# Patient Record
Sex: Male | Born: 1950
Health system: Southern US, Community
[De-identification: ages and names within clinical notes are randomized; demographics above are authoritative.]

## PROBLEM LIST (undated history)

## (undated) DIAGNOSIS — Z8739 Personal history of other diseases of the musculoskeletal system and connective tissue: Secondary | ICD-10-CM

## (undated) DIAGNOSIS — J189 Pneumonia, unspecified organism: Secondary | ICD-10-CM

## (undated) DIAGNOSIS — N186 End stage renal disease: Secondary | ICD-10-CM

## (undated) DIAGNOSIS — S065X9A Traumatic subdural hemorrhage with loss of consciousness of unspecified duration, initial encounter: Secondary | ICD-10-CM

## (undated) DIAGNOSIS — D649 Anemia, unspecified: Secondary | ICD-10-CM

## (undated) DIAGNOSIS — E785 Hyperlipidemia, unspecified: Secondary | ICD-10-CM

## (undated) DIAGNOSIS — H409 Unspecified glaucoma: Secondary | ICD-10-CM

## (undated) DIAGNOSIS — Z860100 Personal history of colon polyps, unspecified: Secondary | ICD-10-CM

## (undated) DIAGNOSIS — M545 Low back pain, unspecified: Secondary | ICD-10-CM

## (undated) DIAGNOSIS — IMO0001 Reserved for inherently not codable concepts without codable children: Secondary | ICD-10-CM

## (undated) DIAGNOSIS — G8929 Other chronic pain: Secondary | ICD-10-CM

## (undated) DIAGNOSIS — N185 Chronic kidney disease, stage 5: Secondary | ICD-10-CM

## (undated) DIAGNOSIS — A539 Syphilis, unspecified: Secondary | ICD-10-CM

## (undated) DIAGNOSIS — I10 Essential (primary) hypertension: Secondary | ICD-10-CM

## (undated) DIAGNOSIS — K219 Gastro-esophageal reflux disease without esophagitis: Secondary | ICD-10-CM

## (undated) DIAGNOSIS — R351 Nocturia: Secondary | ICD-10-CM

## (undated) DIAGNOSIS — Z973 Presence of spectacles and contact lenses: Secondary | ICD-10-CM

## (undated) DIAGNOSIS — I255 Ischemic cardiomyopathy: Secondary | ICD-10-CM

## (undated) DIAGNOSIS — F1910 Other psychoactive substance abuse, uncomplicated: Secondary | ICD-10-CM

## (undated) DIAGNOSIS — I5020 Unspecified systolic (congestive) heart failure: Secondary | ICD-10-CM

## (undated) DIAGNOSIS — E119 Type 2 diabetes mellitus without complications: Secondary | ICD-10-CM

## (undated) DIAGNOSIS — B20 Human immunodeficiency virus [HIV] disease: Secondary | ICD-10-CM

## (undated) DIAGNOSIS — G629 Polyneuropathy, unspecified: Secondary | ICD-10-CM

## (undated) DIAGNOSIS — M1990 Unspecified osteoarthritis, unspecified site: Secondary | ICD-10-CM

## (undated) DIAGNOSIS — I2510 Atherosclerotic heart disease of native coronary artery without angina pectoris: Secondary | ICD-10-CM

## (undated) DIAGNOSIS — I5042 Chronic combined systolic (congestive) and diastolic (congestive) heart failure: Secondary | ICD-10-CM

## (undated) DIAGNOSIS — Z972 Presence of dental prosthetic device (complete) (partial): Secondary | ICD-10-CM

## (undated) DIAGNOSIS — I509 Heart failure, unspecified: Secondary | ICD-10-CM

## (undated) DIAGNOSIS — J449 Chronic obstructive pulmonary disease, unspecified: Secondary | ICD-10-CM

## (undated) DIAGNOSIS — M62838 Other muscle spasm: Secondary | ICD-10-CM

## (undated) DIAGNOSIS — K922 Gastrointestinal hemorrhage, unspecified: Secondary | ICD-10-CM

## (undated) DIAGNOSIS — Z9289 Personal history of other medical treatment: Secondary | ICD-10-CM

## (undated) DIAGNOSIS — J9600 Acute respiratory failure, unspecified whether with hypoxia or hypercapnia: Secondary | ICD-10-CM

## (undated) DIAGNOSIS — S065XAA Traumatic subdural hemorrhage with loss of consciousness status unknown, initial encounter: Secondary | ICD-10-CM

## (undated) DIAGNOSIS — I219 Acute myocardial infarction, unspecified: Secondary | ICD-10-CM

## (undated) DIAGNOSIS — B1910 Unspecified viral hepatitis B without hepatic coma: Secondary | ICD-10-CM

## (undated) DIAGNOSIS — Z87442 Personal history of urinary calculi: Secondary | ICD-10-CM

## (undated) HISTORY — DX: Gastro-esophageal reflux disease without esophagitis: K21.9

## (undated) HISTORY — DX: Acute respiratory failure, unspecified whether with hypoxia or hypercapnia: J96.00

## (undated) HISTORY — DX: Atherosclerotic heart disease of native coronary artery without angina pectoris: I25.10

## (undated) HISTORY — PX: CARDIAC CATHETERIZATION: SHX172

## (undated) HISTORY — DX: Pneumonia, unspecified organism: J18.9

## (undated) HISTORY — DX: Ischemic cardiomyopathy: I25.5

## (undated) HISTORY — DX: Traumatic subdural hemorrhage with loss of consciousness of unspecified duration, initial encounter: S06.5X9A

## (undated) HISTORY — PX: BACK SURGERY: SHX140

## (undated) HISTORY — DX: Traumatic subdural hemorrhage with loss of consciousness status unknown, initial encounter: S06.5XAA

## (undated) HISTORY — PX: FRACTURE SURGERY: SHX138

## (undated) HISTORY — DX: Other psychoactive substance abuse, uncomplicated: F19.10

## (undated) HISTORY — DX: Human immunodeficiency virus (HIV) disease: B20

## (undated) HISTORY — DX: Essential (primary) hypertension: I10

## (undated) HISTORY — DX: Chronic combined systolic (congestive) and diastolic (congestive) heart failure: I50.42

## (undated) HISTORY — PX: MULTIPLE TOOTH EXTRACTIONS: SHX2053

## (undated) HISTORY — DX: Syphilis, unspecified: A53.9

## (undated) HISTORY — DX: Unspecified osteoarthritis, unspecified site: M19.90

## (undated) HISTORY — DX: Anemia, unspecified: D64.9

---
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
## 1998-02-10 ENCOUNTER — Other Ambulatory Visit: Admission: RE | Admit: 1998-02-10 | Discharge: 1998-02-10 | Payer: Self-pay | Admitting: Family Medicine

## 1999-08-22 ENCOUNTER — Emergency Department (HOSPITAL_COMMUNITY): Admission: EM | Admit: 1999-08-22 | Discharge: 1999-08-22 | Payer: Self-pay | Admitting: *Deleted

## 2000-01-04 ENCOUNTER — Encounter: Payer: Self-pay | Admitting: Family Medicine

## 2000-01-04 ENCOUNTER — Ambulatory Visit (HOSPITAL_COMMUNITY): Admission: RE | Admit: 2000-01-04 | Discharge: 2000-01-04 | Payer: Self-pay | Admitting: Family Medicine

## 2001-02-12 ENCOUNTER — Ambulatory Visit (HOSPITAL_COMMUNITY): Admission: RE | Admit: 2001-02-12 | Discharge: 2001-02-12 | Payer: Self-pay | Admitting: Family Medicine

## 2001-02-12 ENCOUNTER — Encounter: Payer: Self-pay | Admitting: Family Medicine

## 2002-10-24 DIAGNOSIS — E119 Type 2 diabetes mellitus without complications: Secondary | ICD-10-CM

## 2002-10-24 HISTORY — DX: Type 2 diabetes mellitus without complications: E11.9

## 2002-11-19 ENCOUNTER — Encounter: Payer: Self-pay | Admitting: Cardiology

## 2002-11-19 ENCOUNTER — Ambulatory Visit (HOSPITAL_COMMUNITY): Admission: RE | Admit: 2002-11-19 | Discharge: 2002-11-19 | Payer: Self-pay | Admitting: Cardiology

## 2003-02-21 ENCOUNTER — Encounter: Payer: Self-pay | Admitting: Cardiothoracic Surgery

## 2003-02-24 ENCOUNTER — Encounter: Payer: Self-pay | Admitting: Cardiothoracic Surgery

## 2003-02-24 ENCOUNTER — Inpatient Hospital Stay (HOSPITAL_COMMUNITY): Admission: RE | Admit: 2003-02-24 | Discharge: 2003-02-27 | Payer: Self-pay | Admitting: Cardiothoracic Surgery

## 2003-02-24 HISTORY — PX: CORONARY ARTERY BYPASS GRAFT: SHX141

## 2003-02-25 ENCOUNTER — Encounter: Payer: Self-pay | Admitting: Cardiothoracic Surgery

## 2003-02-26 ENCOUNTER — Encounter: Payer: Self-pay | Admitting: Cardiothoracic Surgery

## 2003-10-25 DIAGNOSIS — I2510 Atherosclerotic heart disease of native coronary artery without angina pectoris: Secondary | ICD-10-CM

## 2003-10-25 HISTORY — DX: Atherosclerotic heart disease of native coronary artery without angina pectoris: I25.10

## 2004-03-18 ENCOUNTER — Emergency Department (HOSPITAL_COMMUNITY): Admission: EM | Admit: 2004-03-18 | Discharge: 2004-03-18 | Payer: Self-pay | Admitting: Emergency Medicine

## 2004-06-23 ENCOUNTER — Emergency Department (HOSPITAL_COMMUNITY): Admission: EM | Admit: 2004-06-23 | Discharge: 2004-06-23 | Payer: Self-pay

## 2004-07-12 ENCOUNTER — Ambulatory Visit: Payer: Self-pay | Admitting: Internal Medicine

## 2004-08-09 ENCOUNTER — Ambulatory Visit: Payer: Self-pay | Admitting: *Deleted

## 2004-08-13 ENCOUNTER — Ambulatory Visit: Payer: Self-pay | Admitting: Infectious Diseases

## 2004-08-13 ENCOUNTER — Inpatient Hospital Stay (HOSPITAL_COMMUNITY): Admission: EM | Admit: 2004-08-13 | Discharge: 2004-08-18 | Payer: Self-pay | Admitting: Emergency Medicine

## 2004-08-13 HISTORY — PX: ORIF MANDIBULAR FRACTURE: SHX2127

## 2004-08-30 ENCOUNTER — Ambulatory Visit: Payer: Self-pay | Admitting: Cardiology

## 2004-08-31 ENCOUNTER — Ambulatory Visit: Payer: Self-pay | Admitting: Internal Medicine

## 2004-09-02 ENCOUNTER — Ambulatory Visit: Payer: Self-pay | Admitting: Cardiology

## 2004-09-20 ENCOUNTER — Ambulatory Visit: Payer: Self-pay | Admitting: Internal Medicine

## 2004-10-13 ENCOUNTER — Ambulatory Visit: Payer: Self-pay | Admitting: Internal Medicine

## 2004-11-22 ENCOUNTER — Encounter (INDEPENDENT_AMBULATORY_CARE_PROVIDER_SITE_OTHER): Payer: Self-pay | Admitting: *Deleted

## 2004-11-22 ENCOUNTER — Ambulatory Visit: Payer: Self-pay | Admitting: Internal Medicine

## 2004-11-22 LAB — CONVERTED CEMR LAB: CD4 T Cell Abs: 950

## 2004-12-16 ENCOUNTER — Ambulatory Visit: Payer: Self-pay

## 2004-12-27 ENCOUNTER — Ambulatory Visit: Payer: Self-pay | Admitting: Cardiology

## 2004-12-30 ENCOUNTER — Ambulatory Visit: Payer: Self-pay | Admitting: *Deleted

## 2005-01-03 ENCOUNTER — Inpatient Hospital Stay (HOSPITAL_BASED_OUTPATIENT_CLINIC_OR_DEPARTMENT_OTHER): Admission: RE | Admit: 2005-01-03 | Discharge: 2005-01-03 | Payer: Self-pay | Admitting: Cardiology

## 2005-01-03 ENCOUNTER — Ambulatory Visit: Payer: Self-pay | Admitting: Cardiology

## 2005-01-12 ENCOUNTER — Ambulatory Visit: Payer: Self-pay | Admitting: Cardiology

## 2005-01-19 ENCOUNTER — Ambulatory Visit: Payer: Self-pay | Admitting: Internal Medicine

## 2005-01-20 ENCOUNTER — Ambulatory Visit: Payer: Self-pay

## 2005-02-08 ENCOUNTER — Ambulatory Visit: Payer: Self-pay | Admitting: Cardiology

## 2005-02-17 ENCOUNTER — Ambulatory Visit: Payer: Self-pay | Admitting: Internal Medicine

## 2005-05-26 ENCOUNTER — Ambulatory Visit: Payer: Self-pay | Admitting: Internal Medicine

## 2005-06-28 ENCOUNTER — Emergency Department (HOSPITAL_COMMUNITY): Admission: EM | Admit: 2005-06-28 | Discharge: 2005-06-28 | Payer: Self-pay | Admitting: Emergency Medicine

## 2005-07-08 ENCOUNTER — Ambulatory Visit: Payer: Self-pay | Admitting: Family Medicine

## 2005-07-27 ENCOUNTER — Ambulatory Visit: Payer: Self-pay | Admitting: Internal Medicine

## 2005-08-16 ENCOUNTER — Ambulatory Visit: Payer: Self-pay | Admitting: Internal Medicine

## 2005-10-03 ENCOUNTER — Ambulatory Visit: Payer: Self-pay | Admitting: Internal Medicine

## 2005-10-26 ENCOUNTER — Emergency Department (HOSPITAL_COMMUNITY): Admission: EM | Admit: 2005-10-26 | Discharge: 2005-10-26 | Payer: Self-pay | Admitting: Emergency Medicine

## 2005-11-01 ENCOUNTER — Ambulatory Visit: Payer: Self-pay | Admitting: Internal Medicine

## 2005-11-01 ENCOUNTER — Ambulatory Visit (HOSPITAL_COMMUNITY): Admission: RE | Admit: 2005-11-01 | Discharge: 2005-11-01 | Payer: Self-pay | Admitting: Internal Medicine

## 2005-11-02 ENCOUNTER — Encounter (INDEPENDENT_AMBULATORY_CARE_PROVIDER_SITE_OTHER): Payer: Self-pay | Admitting: *Deleted

## 2005-11-02 LAB — CONVERTED CEMR LAB
CD4 Count: 990 microliters
HIV 1 RNA Quant: 399 copies/mL

## 2005-12-21 ENCOUNTER — Ambulatory Visit: Payer: Self-pay | Admitting: Infectious Diseases

## 2006-01-02 ENCOUNTER — Ambulatory Visit: Payer: Self-pay | Admitting: Cardiology

## 2006-02-21 ENCOUNTER — Ambulatory Visit: Payer: Self-pay | Admitting: Infectious Diseases

## 2006-02-21 ENCOUNTER — Encounter: Admission: RE | Admit: 2006-02-21 | Discharge: 2006-02-21 | Payer: Self-pay | Admitting: Infectious Diseases

## 2006-02-21 ENCOUNTER — Encounter (INDEPENDENT_AMBULATORY_CARE_PROVIDER_SITE_OTHER): Payer: Self-pay | Admitting: *Deleted

## 2006-02-21 LAB — CONVERTED CEMR LAB: CD4 Count: 910 microliters

## 2006-03-22 ENCOUNTER — Ambulatory Visit: Payer: Self-pay | Admitting: Internal Medicine

## 2006-03-22 ENCOUNTER — Encounter (INDEPENDENT_AMBULATORY_CARE_PROVIDER_SITE_OTHER): Payer: Self-pay | Admitting: Infectious Diseases

## 2006-03-27 ENCOUNTER — Ambulatory Visit: Payer: Self-pay | Admitting: Infectious Diseases

## 2006-05-08 ENCOUNTER — Emergency Department (HOSPITAL_COMMUNITY): Admission: EM | Admit: 2006-05-08 | Discharge: 2006-05-08 | Payer: Self-pay | Admitting: Emergency Medicine

## 2006-05-24 HISTORY — PX: LAPAROSCOPIC CHOLECYSTECTOMY: SUR755

## 2006-06-14 ENCOUNTER — Encounter (INDEPENDENT_AMBULATORY_CARE_PROVIDER_SITE_OTHER): Payer: Self-pay | Admitting: *Deleted

## 2006-06-14 ENCOUNTER — Inpatient Hospital Stay (HOSPITAL_COMMUNITY): Admission: EM | Admit: 2006-06-14 | Discharge: 2006-06-17 | Payer: Self-pay | Admitting: Emergency Medicine

## 2006-06-20 ENCOUNTER — Ambulatory Visit: Payer: Self-pay | Admitting: Gastroenterology

## 2006-08-14 ENCOUNTER — Encounter (INDEPENDENT_AMBULATORY_CARE_PROVIDER_SITE_OTHER): Payer: Self-pay | Admitting: Infectious Diseases

## 2006-08-14 ENCOUNTER — Encounter (INDEPENDENT_AMBULATORY_CARE_PROVIDER_SITE_OTHER): Payer: Self-pay | Admitting: *Deleted

## 2006-08-14 ENCOUNTER — Ambulatory Visit: Payer: Self-pay | Admitting: Internal Medicine

## 2006-08-14 ENCOUNTER — Encounter: Admission: RE | Admit: 2006-08-14 | Discharge: 2006-08-14 | Payer: Self-pay | Admitting: Infectious Diseases

## 2006-08-14 LAB — CONVERTED CEMR LAB
ALT: 30 units/L (ref 0–40)
AST: 21 units/L (ref 0–37)
Albumin: 3.8 g/dL (ref 3.5–5.2)
Alkaline Phosphatase: 86 units/L (ref 39–117)
BUN: 17 mg/dL (ref 6–23)
Basophils Absolute: 0 10*3/uL (ref 0.0–0.1)
Basophils percent auto: 1 % (ref 0–1)
CO2: 28 meq/L (ref 19–32)
Calcium: 9.4 mg/dL (ref 8.4–10.5)
Chloride: 107 meq/L (ref 96–112)
Creatinine, Ser: 1.17 mg/dL (ref 0.40–1.50)
Eosinophils Absolute: 0.2 cells/mcL (ref 0.0–0.7)
Eosinophils Relative: 2 % (ref 0–5)
Glucose, Bld: 89 mg/dL (ref 70–99)
HCT: 44.6 % (ref 39.0–52.0)
HIV 1 RNA Quant: 49 copies/mL
HIV 1 RNA Quant: <50 copies/mL (ref <50)
HIV-1 RNA Quant, Log: <1.7 (ref <1.70)
Hemoglobin: 15.2 g/dL (ref 13.0–17.0)
Leukocyte count, blood: 6.3 10*9/L (ref 4.0–10.5)
Lymphocytes Relative: 57 % — ABNORMAL HIGH (ref 12–46)
Lymphs Abs: 3.6 10*3/uL — ABNORMAL HIGH (ref 0.7–3.3)
MCHC: 34.1 g/dL (ref 30.0–36.0)
MCV: 108 fL — ABNORMAL HIGH (ref 78.0–100.0)
Monocytes Absolute: 0.6 10*3/uL (ref 0.2–0.7)
Monocytes Relative: 9 % (ref 3–11)
Neutro Abs: 1.9 10*3/uL (ref 1.7–7.7)
Neutrophils Relative %: 31 % — ABNORMAL LOW (ref 43–77)
Platelets: 205 10*3/uL (ref 150–400)
Potassium: 4.4 meq/L (ref 3.5–5.3)
RBC: 4.13 M/uL — ABNORMAL LOW (ref 4.22–5.81)
RDW: 13.4 % (ref 11.5–14.0)
Sodium: 142 meq/L (ref 135–145)
Total Bilirubin: 1.4 mg/dL — ABNORMAL HIGH (ref 0.3–1.2)
Total Protein: 7.2 g/dL (ref 6.0–8.3)

## 2006-08-28 ENCOUNTER — Ambulatory Visit: Payer: Self-pay | Admitting: Infectious Diseases

## 2006-09-02 DIAGNOSIS — A539 Syphilis, unspecified: Secondary | ICD-10-CM

## 2006-09-02 DIAGNOSIS — B20 Human immunodeficiency virus [HIV] disease: Secondary | ICD-10-CM | POA: Insufficient documentation

## 2006-11-20 ENCOUNTER — Ambulatory Visit: Payer: Self-pay | Admitting: Internal Medicine

## 2006-11-20 DIAGNOSIS — I10 Essential (primary) hypertension: Secondary | ICD-10-CM | POA: Insufficient documentation

## 2006-11-20 DIAGNOSIS — F17200 Nicotine dependence, unspecified, uncomplicated: Secondary | ICD-10-CM

## 2006-11-20 DIAGNOSIS — J309 Allergic rhinitis, unspecified: Secondary | ICD-10-CM | POA: Insufficient documentation

## 2006-11-20 DIAGNOSIS — M109 Gout, unspecified: Secondary | ICD-10-CM | POA: Insufficient documentation

## 2006-11-20 DIAGNOSIS — E782 Mixed hyperlipidemia: Secondary | ICD-10-CM | POA: Insufficient documentation

## 2006-11-20 DIAGNOSIS — E785 Hyperlipidemia, unspecified: Secondary | ICD-10-CM

## 2006-11-20 LAB — CONVERTED CEMR LAB
Glucose, Bld: 102 mg/dL
Hgb A1c MFr Bld: 5 %

## 2006-11-21 ENCOUNTER — Encounter (INDEPENDENT_AMBULATORY_CARE_PROVIDER_SITE_OTHER): Payer: Self-pay | Admitting: Infectious Diseases

## 2006-11-21 ENCOUNTER — Ambulatory Visit: Payer: Self-pay | Admitting: Internal Medicine

## 2006-11-21 LAB — CONVERTED CEMR LAB
ALT: 24 units/L (ref 0–53)
AST: 24 units/L (ref 0–37)
Albumin: 4 g/dL (ref 3.5–5.2)
Alkaline Phosphatase: 75 units/L (ref 39–117)
BUN: 22 mg/dL (ref 6–23)
CO2: 25 meq/L (ref 19–32)
Calcium: 8.9 mg/dL (ref 8.4–10.5)
Chloride: 107 meq/L (ref 96–112)
Cholesterol: 153 mg/dL (ref 0–200)
Creatinine, Ser: 0.95 mg/dL (ref 0.40–1.50)
Creatinine, Urine: 159.2 mg/dL
Glucose, Bld: 70 mg/dL (ref 70–99)
HDL: 39 mg/dL — ABNORMAL LOW (ref >39)
LDL Cholesterol: 67 mg/dL (ref 0–99)
Microalb Creat Ratio: 489.3 mg/g — ABNORMAL HIGH (ref 0.0–30.0)
Microalb, Ur: 77.9 mg/dL — ABNORMAL HIGH (ref 0.00–1.89)
Potassium: 4.3 meq/L (ref 3.5–5.3)
Sodium: 141 meq/L (ref 135–145)
Total Bilirubin: 1.3 mg/dL — ABNORMAL HIGH (ref 0.3–1.2)
Total CHOL/HDL Ratio: 3.9
Total Protein: 7.7 g/dL (ref 6.0–8.3)
Triglycerides: 236 mg/dL — ABNORMAL HIGH (ref <150)
VLDL: 47 mg/dL — ABNORMAL HIGH (ref 0–40)

## 2006-11-24 HISTORY — PX: INTERTROCHANTERIC HIP FRACTURE SURGERY: SHX681

## 2006-12-03 ENCOUNTER — Inpatient Hospital Stay (HOSPITAL_COMMUNITY): Admission: EM | Admit: 2006-12-03 | Discharge: 2006-12-07 | Payer: Self-pay | Admitting: Emergency Medicine

## 2006-12-03 ENCOUNTER — Ambulatory Visit: Payer: Self-pay | Admitting: Internal Medicine

## 2006-12-07 ENCOUNTER — Encounter (INDEPENDENT_AMBULATORY_CARE_PROVIDER_SITE_OTHER): Payer: Self-pay | Admitting: Internal Medicine

## 2006-12-12 ENCOUNTER — Encounter (INDEPENDENT_AMBULATORY_CARE_PROVIDER_SITE_OTHER): Payer: Self-pay | Admitting: Internal Medicine

## 2006-12-18 ENCOUNTER — Telehealth: Payer: Self-pay | Admitting: *Deleted

## 2006-12-18 ENCOUNTER — Encounter (INDEPENDENT_AMBULATORY_CARE_PROVIDER_SITE_OTHER): Payer: Self-pay | Admitting: *Deleted

## 2006-12-18 LAB — CONVERTED CEMR LAB: RPR Titer: 1:4 {titer}

## 2006-12-20 ENCOUNTER — Telehealth: Payer: Self-pay | Admitting: *Deleted

## 2006-12-31 ENCOUNTER — Encounter (INDEPENDENT_AMBULATORY_CARE_PROVIDER_SITE_OTHER): Payer: Self-pay | Admitting: *Deleted

## 2007-01-04 ENCOUNTER — Encounter: Admission: RE | Admit: 2007-01-04 | Discharge: 2007-01-04 | Payer: Self-pay | Admitting: Infectious Diseases

## 2007-01-04 ENCOUNTER — Ambulatory Visit: Payer: Self-pay | Admitting: *Deleted

## 2007-01-04 ENCOUNTER — Encounter (INDEPENDENT_AMBULATORY_CARE_PROVIDER_SITE_OTHER): Payer: Self-pay | Admitting: Infectious Diseases

## 2007-01-04 DIAGNOSIS — S72143A Displaced intertrochanteric fracture of unspecified femur, initial encounter for closed fracture: Secondary | ICD-10-CM

## 2007-01-04 LAB — CONVERTED CEMR LAB
ALT: 20 units/L (ref 0–53)
Alkaline Phosphatase: 181 units/L — ABNORMAL HIGH (ref 39–117)
Blood Glucose, Fingerstick: 76
HIV-1 RNA Quant, Log: <1.7 (ref <1.70)
Hemoglobin, Urine: NEGATIVE
LDL Cholesterol: 43 mg/dL (ref 0–99)
Leukocytes, UA: NEGATIVE
Protein, ur: 100 mg/dL — AB
RPR Titer: 1:2 {titer}
Sodium: 140 meq/L (ref 135–145)
Total Bilirubin: 1.4 mg/dL — ABNORMAL HIGH (ref 0.3–1.2)
Total Protein: 7.7 g/dL (ref 6.0–8.3)
Triglycerides: 220 mg/dL — ABNORMAL HIGH (ref <150)
Urine Glucose: NEGATIVE mg/dL
VLDL: 44 mg/dL — ABNORMAL HIGH (ref 0–40)

## 2007-01-08 ENCOUNTER — Ambulatory Visit: Payer: Self-pay | Admitting: Cardiology

## 2007-01-09 ENCOUNTER — Encounter (INDEPENDENT_AMBULATORY_CARE_PROVIDER_SITE_OTHER): Payer: Self-pay | Admitting: Infectious Diseases

## 2007-01-09 LAB — CONVERTED CEMR LAB
INR: 0.9 (ref 0.0–1.5)
Platelets: 207 10*3/uL (ref 150–400)
RDW: 14.3 % — ABNORMAL HIGH (ref 11.5–14.0)

## 2007-01-10 ENCOUNTER — Telehealth (INDEPENDENT_AMBULATORY_CARE_PROVIDER_SITE_OTHER): Payer: Self-pay | Admitting: Infectious Diseases

## 2007-01-11 ENCOUNTER — Encounter (INDEPENDENT_AMBULATORY_CARE_PROVIDER_SITE_OTHER): Payer: Self-pay | Admitting: Infectious Diseases

## 2007-01-11 ENCOUNTER — Ambulatory Visit: Payer: Self-pay | Admitting: Internal Medicine

## 2007-01-11 LAB — CONVERTED CEMR LAB
RBC Folate: 570 ng/mL (ref 180–600)
Vitamin B-12: 388 pg/mL (ref 211–911)

## 2007-01-23 ENCOUNTER — Ambulatory Visit: Payer: Self-pay

## 2007-01-23 ENCOUNTER — Encounter (INDEPENDENT_AMBULATORY_CARE_PROVIDER_SITE_OTHER): Payer: Self-pay | Admitting: Infectious Diseases

## 2007-01-23 ENCOUNTER — Encounter: Payer: Self-pay | Admitting: Internal Medicine

## 2007-01-25 ENCOUNTER — Telehealth (INDEPENDENT_AMBULATORY_CARE_PROVIDER_SITE_OTHER): Payer: Self-pay | Admitting: Infectious Diseases

## 2007-02-07 ENCOUNTER — Telehealth: Payer: Self-pay | Admitting: Internal Medicine

## 2007-02-21 ENCOUNTER — Ambulatory Visit: Payer: Self-pay | Admitting: Cardiology

## 2007-02-21 ENCOUNTER — Encounter (INDEPENDENT_AMBULATORY_CARE_PROVIDER_SITE_OTHER): Payer: Self-pay | Admitting: Infectious Diseases

## 2007-03-12 ENCOUNTER — Ambulatory Visit: Payer: Self-pay | Admitting: Infectious Diseases

## 2007-03-17 ENCOUNTER — Encounter (INDEPENDENT_AMBULATORY_CARE_PROVIDER_SITE_OTHER): Payer: Self-pay | Admitting: Infectious Diseases

## 2007-04-25 ENCOUNTER — Telehealth: Payer: Self-pay | Admitting: *Deleted

## 2007-05-15 ENCOUNTER — Encounter: Admission: RE | Admit: 2007-05-15 | Discharge: 2007-05-15 | Payer: Self-pay | Admitting: Internal Medicine

## 2007-05-15 ENCOUNTER — Ambulatory Visit: Payer: Self-pay | Admitting: Internal Medicine

## 2007-05-15 LAB — CONVERTED CEMR LAB
ALT: 28 units/L (ref 0–53)
Albumin: 3.8 g/dL (ref 3.5–5.2)
Basophils Absolute: 0.1 10*3/uL (ref 0.0–0.1)
CO2: 22 meq/L (ref 19–32)
Eosinophils Relative: 3 % (ref 0–5)
Glucose, Bld: 132 mg/dL — ABNORMAL HIGH (ref 70–99)
Lymphocytes Relative: 55 % — ABNORMAL HIGH (ref 12–46)
Lymphs Abs: 2.8 10*3/uL (ref 0.7–3.3)
Neutro Abs: 1.7 10*3/uL (ref 1.7–7.7)
Neutrophils Relative %: 34 % — ABNORMAL LOW (ref 43–77)
Platelets: 226 10*3/uL (ref 150–400)
Potassium: 4.4 meq/L (ref 3.5–5.3)
RDW: 14.2 % — ABNORMAL HIGH (ref 11.5–14.0)
Sodium: 138 meq/L (ref 135–145)
Total Bilirubin: 1.3 mg/dL — ABNORMAL HIGH (ref 0.3–1.2)
Total Protein: 7.5 g/dL (ref 6.0–8.3)
WBC: 5.1 10*3/uL (ref 4.0–10.5)

## 2007-06-04 ENCOUNTER — Ambulatory Visit: Payer: Self-pay | Admitting: Infectious Disease

## 2007-06-05 ENCOUNTER — Ambulatory Visit: Payer: Self-pay | Admitting: Internal Medicine

## 2007-06-05 LAB — CONVERTED CEMR LAB: Hgb A1c MFr Bld: 5.5 %

## 2007-07-11 ENCOUNTER — Telehealth (INDEPENDENT_AMBULATORY_CARE_PROVIDER_SITE_OTHER): Payer: Self-pay | Admitting: *Deleted

## 2007-07-12 ENCOUNTER — Ambulatory Visit: Payer: Self-pay | Admitting: Internal Medicine

## 2007-07-12 LAB — CONVERTED CEMR LAB: Blood Glucose, Home Monitor: 2 mg/dL

## 2007-07-17 ENCOUNTER — Telehealth: Payer: Self-pay | Admitting: *Deleted

## 2007-08-27 ENCOUNTER — Ambulatory Visit: Payer: Self-pay | Admitting: Cardiology

## 2007-09-03 ENCOUNTER — Telehealth (INDEPENDENT_AMBULATORY_CARE_PROVIDER_SITE_OTHER): Payer: Self-pay | Admitting: Infectious Diseases

## 2007-09-10 ENCOUNTER — Ambulatory Visit: Payer: Self-pay | Admitting: Internal Medicine

## 2007-09-10 ENCOUNTER — Ambulatory Visit: Payer: Self-pay | Admitting: *Deleted

## 2007-09-10 ENCOUNTER — Telehealth: Payer: Self-pay | Admitting: *Deleted

## 2007-09-10 ENCOUNTER — Encounter (INDEPENDENT_AMBULATORY_CARE_PROVIDER_SITE_OTHER): Payer: Self-pay | Admitting: *Deleted

## 2007-09-10 ENCOUNTER — Encounter: Admission: RE | Admit: 2007-09-10 | Discharge: 2007-09-10 | Payer: Self-pay | Admitting: Internal Medicine

## 2007-09-10 DIAGNOSIS — M549 Dorsalgia, unspecified: Secondary | ICD-10-CM | POA: Insufficient documentation

## 2007-09-10 LAB — CONVERTED CEMR LAB
Bilirubin Urine: NEGATIVE
Ketones, urine, test strip: NEGATIVE
Urobilinogen, UA: 0.2
WBC Urine, dipstick: NEGATIVE
pH: 5

## 2007-09-27 ENCOUNTER — Encounter (INDEPENDENT_AMBULATORY_CARE_PROVIDER_SITE_OTHER): Payer: Self-pay | Admitting: *Deleted

## 2007-10-02 ENCOUNTER — Ambulatory Visit: Payer: Self-pay | Admitting: Internal Medicine

## 2007-10-17 ENCOUNTER — Encounter: Payer: Self-pay | Admitting: Internal Medicine

## 2007-10-22 ENCOUNTER — Telehealth: Payer: Self-pay | Admitting: *Deleted

## 2007-10-23 ENCOUNTER — Encounter: Payer: Self-pay | Admitting: Internal Medicine

## 2007-10-30 ENCOUNTER — Ambulatory Visit: Payer: Self-pay | Admitting: Infectious Disease

## 2007-10-30 ENCOUNTER — Encounter (INDEPENDENT_AMBULATORY_CARE_PROVIDER_SITE_OTHER): Payer: Self-pay | Admitting: *Deleted

## 2007-10-30 ENCOUNTER — Encounter (INDEPENDENT_AMBULATORY_CARE_PROVIDER_SITE_OTHER): Payer: Self-pay | Admitting: Infectious Diseases

## 2007-10-30 ENCOUNTER — Ambulatory Visit (HOSPITAL_COMMUNITY): Admission: RE | Admit: 2007-10-30 | Discharge: 2007-10-30 | Payer: Self-pay | Admitting: Infectious Disease

## 2007-10-30 DIAGNOSIS — M79609 Pain in unspecified limb: Secondary | ICD-10-CM

## 2007-11-01 LAB — CONVERTED CEMR LAB
BUN: 21 mg/dL (ref 6–23)
Chloride: 108 meq/L (ref 96–112)
Glucose, Bld: 49 mg/dL — ABNORMAL LOW (ref 70–99)
Potassium: 4.2 meq/L (ref 3.5–5.3)

## 2007-11-08 ENCOUNTER — Ambulatory Visit (HOSPITAL_COMMUNITY): Admission: RE | Admit: 2007-11-08 | Discharge: 2007-11-08 | Payer: Self-pay | Admitting: *Deleted

## 2007-11-08 ENCOUNTER — Ambulatory Visit: Payer: Self-pay | Admitting: Vascular Surgery

## 2007-12-20 ENCOUNTER — Telehealth (INDEPENDENT_AMBULATORY_CARE_PROVIDER_SITE_OTHER): Payer: Self-pay | Admitting: Infectious Diseases

## 2008-01-28 ENCOUNTER — Telehealth (INDEPENDENT_AMBULATORY_CARE_PROVIDER_SITE_OTHER): Payer: Self-pay | Admitting: Infectious Diseases

## 2008-01-31 ENCOUNTER — Telehealth: Payer: Self-pay | Admitting: Infectious Diseases

## 2008-02-12 ENCOUNTER — Encounter (INDEPENDENT_AMBULATORY_CARE_PROVIDER_SITE_OTHER): Payer: Self-pay | Admitting: *Deleted

## 2008-02-18 ENCOUNTER — Telehealth (INDEPENDENT_AMBULATORY_CARE_PROVIDER_SITE_OTHER): Payer: Self-pay | Admitting: Infectious Diseases

## 2008-02-26 ENCOUNTER — Encounter (INDEPENDENT_AMBULATORY_CARE_PROVIDER_SITE_OTHER): Payer: Self-pay | Admitting: Infectious Diseases

## 2008-03-10 ENCOUNTER — Ambulatory Visit: Payer: Self-pay | Admitting: Internal Medicine

## 2008-03-10 ENCOUNTER — Encounter: Admission: RE | Admit: 2008-03-10 | Discharge: 2008-03-10 | Payer: Self-pay | Admitting: Internal Medicine

## 2008-03-10 LAB — CONVERTED CEMR LAB
ALT: 31 units/L (ref 0–53)
AST: 22 units/L (ref 0–37)
Alkaline Phosphatase: 82 units/L (ref 39–117)
Cholesterol: 118 mg/dL (ref 0–200)
Creatinine, Ser: 1.07 mg/dL (ref 0.40–1.50)
HCT: 40.8 % (ref 39.0–52.0)
MCHC: 34.1 g/dL (ref 30.0–36.0)
MCV: 106.3 fL — ABNORMAL HIGH (ref 78.0–100.0)
Platelets: 210 10*3/uL (ref 150–400)
RDW: 13.3 % (ref 11.5–15.5)
RPR Titer: 1:2 {titer}
Total Bilirubin: 1 mg/dL (ref 0.3–1.2)
Total CHOL/HDL Ratio: 4.1
VLDL: 28 mg/dL (ref 0–40)

## 2008-03-12 ENCOUNTER — Ambulatory Visit: Payer: Self-pay | Admitting: Cardiology

## 2008-03-14 ENCOUNTER — Encounter (INDEPENDENT_AMBULATORY_CARE_PROVIDER_SITE_OTHER): Payer: Self-pay | Admitting: Infectious Diseases

## 2008-04-01 ENCOUNTER — Telehealth (INDEPENDENT_AMBULATORY_CARE_PROVIDER_SITE_OTHER): Payer: Self-pay | Admitting: Infectious Diseases

## 2008-04-01 ENCOUNTER — Ambulatory Visit: Payer: Self-pay | Admitting: Internal Medicine

## 2008-04-08 ENCOUNTER — Telehealth: Payer: Self-pay | Admitting: Internal Medicine

## 2008-04-09 ENCOUNTER — Telehealth (INDEPENDENT_AMBULATORY_CARE_PROVIDER_SITE_OTHER): Payer: Self-pay | Admitting: Infectious Diseases

## 2008-04-28 ENCOUNTER — Telehealth: Payer: Self-pay | Admitting: *Deleted

## 2008-09-03 ENCOUNTER — Telehealth: Payer: Self-pay | Admitting: Internal Medicine

## 2008-09-08 ENCOUNTER — Telehealth: Payer: Self-pay | Admitting: Internal Medicine

## 2008-09-08 ENCOUNTER — Telehealth: Payer: Self-pay | Admitting: *Deleted

## 2008-09-11 ENCOUNTER — Ambulatory Visit: Payer: Self-pay | Admitting: Cardiology

## 2008-09-15 ENCOUNTER — Encounter (INDEPENDENT_AMBULATORY_CARE_PROVIDER_SITE_OTHER): Payer: Self-pay | Admitting: Internal Medicine

## 2008-09-15 ENCOUNTER — Encounter: Payer: Self-pay | Admitting: Internal Medicine

## 2008-09-15 ENCOUNTER — Ambulatory Visit: Payer: Self-pay | Admitting: *Deleted

## 2008-09-15 LAB — CONVERTED CEMR LAB: HIV-1 RNA Quant, Log: <1.68 (ref <1.68)

## 2008-09-16 LAB — CONVERTED CEMR LAB
ALT: 28 units/L (ref 0–53)
BUN: 24 mg/dL — ABNORMAL HIGH (ref 6–23)
CO2: 20 meq/L (ref 19–32)
Calcium: 9.4 mg/dL (ref 8.4–10.5)
Chloride: 106 meq/L (ref 96–112)
Cholesterol: 164 mg/dL (ref 0–200)
Creatinine, Ser: 1.34 mg/dL (ref 0.40–1.50)
Glucose, Bld: 77 mg/dL (ref 70–99)
HCT: 45.1 % (ref 39.0–52.0)
HDL: 34 mg/dL — ABNORMAL LOW (ref >39)
Hemoglobin: 15.6 g/dL (ref 13.0–17.0)
MCV: 107.9 fL — ABNORMAL HIGH (ref 78.0–100.0)
RBC: 4.18 M/uL — ABNORMAL LOW (ref 4.22–5.81)
Total CHOL/HDL Ratio: 4.8
Triglycerides: 204 mg/dL — ABNORMAL HIGH (ref <150)
WBC: 5.2 10*3/uL (ref 4.0–10.5)

## 2008-09-23 ENCOUNTER — Ambulatory Visit: Payer: Self-pay | Admitting: Internal Medicine

## 2008-09-30 ENCOUNTER — Encounter (INDEPENDENT_AMBULATORY_CARE_PROVIDER_SITE_OTHER): Payer: Self-pay | Admitting: *Deleted

## 2008-11-14 ENCOUNTER — Telehealth (INDEPENDENT_AMBULATORY_CARE_PROVIDER_SITE_OTHER): Payer: Self-pay | Admitting: *Deleted

## 2008-11-17 ENCOUNTER — Telehealth (INDEPENDENT_AMBULATORY_CARE_PROVIDER_SITE_OTHER): Payer: Self-pay | Admitting: Internal Medicine

## 2008-11-18 ENCOUNTER — Ambulatory Visit: Payer: Self-pay | Admitting: Internal Medicine

## 2008-11-18 ENCOUNTER — Encounter: Payer: Self-pay | Admitting: Internal Medicine

## 2008-11-18 DIAGNOSIS — J0100 Acute maxillary sinusitis, unspecified: Secondary | ICD-10-CM | POA: Insufficient documentation

## 2009-01-13 ENCOUNTER — Encounter: Payer: Self-pay | Admitting: Internal Medicine

## 2009-02-12 ENCOUNTER — Telehealth: Payer: Self-pay | Admitting: Internal Medicine

## 2009-02-13 ENCOUNTER — Encounter: Payer: Self-pay | Admitting: Internal Medicine

## 2009-02-13 ENCOUNTER — Encounter (INDEPENDENT_AMBULATORY_CARE_PROVIDER_SITE_OTHER): Payer: Self-pay | Admitting: Internal Medicine

## 2009-03-03 ENCOUNTER — Ambulatory Visit: Payer: Self-pay | Admitting: Internal Medicine

## 2009-03-03 LAB — CONVERTED CEMR LAB
ALT: 33 units/L (ref 0–53)
Basophils Absolute: 0 10*3/uL (ref 0.0–0.1)
Basophils Relative: 1 % (ref 0–1)
CO2: 20 meq/L (ref 19–32)
Calcium: 8.7 mg/dL (ref 8.4–10.5)
Chloride: 108 meq/L (ref 96–112)
Cholesterol: 136 mg/dL (ref 0–200)
Creatinine, Ser: 1.1 mg/dL (ref 0.40–1.50)
GFR calc Af Amer: >60 mL/min (ref >60)
Glucose, Bld: 151 mg/dL — ABNORMAL HIGH (ref 70–99)
HIV 1 RNA Quant: 84 copies/mL — ABNORMAL HIGH (ref <48)
HIV-1 RNA Quant, Log: 1.92 — ABNORMAL HIGH (ref <1.68)
MCHC: 34.4 g/dL (ref 30.0–36.0)
Neutro Abs: 1.7 10*3/uL (ref 1.7–7.7)
Neutrophils Relative %: 38 % — ABNORMAL LOW (ref 43–77)
RBC: 4.11 M/uL — ABNORMAL LOW (ref 4.22–5.81)
RDW: 13.3 % (ref 11.5–15.5)
Total Protein: 7.2 g/dL (ref 6.0–8.3)

## 2009-03-18 ENCOUNTER — Telehealth: Payer: Self-pay | Admitting: Internal Medicine

## 2009-03-19 ENCOUNTER — Encounter (INDEPENDENT_AMBULATORY_CARE_PROVIDER_SITE_OTHER): Payer: Self-pay | Admitting: Internal Medicine

## 2009-03-24 ENCOUNTER — Ambulatory Visit: Payer: Self-pay | Admitting: Internal Medicine

## 2009-03-31 ENCOUNTER — Encounter (INDEPENDENT_AMBULATORY_CARE_PROVIDER_SITE_OTHER): Payer: Self-pay | Admitting: Internal Medicine

## 2009-03-31 ENCOUNTER — Ambulatory Visit: Payer: Self-pay | Admitting: Internal Medicine

## 2009-03-31 LAB — CONVERTED CEMR LAB: Blood Glucose, Fingerstick: 139

## 2009-04-01 LAB — CONVERTED CEMR LAB: Microalb, Ur: 205.77 mg/dL — ABNORMAL HIGH (ref 0.00–1.89)

## 2009-04-06 ENCOUNTER — Ambulatory Visit: Payer: Self-pay | Admitting: Internal Medicine

## 2009-04-06 ENCOUNTER — Encounter (INDEPENDENT_AMBULATORY_CARE_PROVIDER_SITE_OTHER): Payer: Self-pay | Admitting: Internal Medicine

## 2009-04-06 ENCOUNTER — Telehealth (INDEPENDENT_AMBULATORY_CARE_PROVIDER_SITE_OTHER): Payer: Self-pay | Admitting: Internal Medicine

## 2009-04-07 ENCOUNTER — Telehealth (INDEPENDENT_AMBULATORY_CARE_PROVIDER_SITE_OTHER): Payer: Self-pay | Admitting: *Deleted

## 2009-04-20 ENCOUNTER — Ambulatory Visit: Payer: Self-pay | Admitting: Cardiology

## 2009-04-20 DIAGNOSIS — I5042 Chronic combined systolic (congestive) and diastolic (congestive) heart failure: Secondary | ICD-10-CM | POA: Insufficient documentation

## 2009-04-20 DIAGNOSIS — I2510 Atherosclerotic heart disease of native coronary artery without angina pectoris: Secondary | ICD-10-CM | POA: Insufficient documentation

## 2009-04-20 DIAGNOSIS — I5043 Acute on chronic combined systolic (congestive) and diastolic (congestive) heart failure: Secondary | ICD-10-CM | POA: Insufficient documentation

## 2009-04-22 LAB — CONVERTED CEMR LAB
HDL: 36 mg/dL — ABNORMAL LOW (ref >39)
Total CHOL/HDL Ratio: 3.8

## 2009-05-15 ENCOUNTER — Encounter: Payer: Self-pay | Admitting: Internal Medicine

## 2009-05-18 ENCOUNTER — Encounter: Payer: Self-pay | Admitting: Cardiology

## 2009-06-10 ENCOUNTER — Encounter (INDEPENDENT_AMBULATORY_CARE_PROVIDER_SITE_OTHER): Payer: Self-pay | Admitting: Internal Medicine

## 2009-06-18 ENCOUNTER — Encounter: Payer: Self-pay | Admitting: Internal Medicine

## 2009-08-10 ENCOUNTER — Encounter: Payer: Self-pay | Admitting: Cardiology

## 2009-08-10 ENCOUNTER — Ambulatory Visit: Payer: Self-pay

## 2009-08-10 ENCOUNTER — Ambulatory Visit (HOSPITAL_COMMUNITY): Admission: RE | Admit: 2009-08-10 | Discharge: 2009-08-10 | Payer: Self-pay | Admitting: Internal Medicine

## 2009-08-10 ENCOUNTER — Ambulatory Visit: Payer: Self-pay | Admitting: Internal Medicine

## 2009-08-20 ENCOUNTER — Telehealth: Payer: Self-pay

## 2009-08-24 ENCOUNTER — Ambulatory Visit: Payer: Self-pay | Admitting: Internal Medicine

## 2009-09-10 ENCOUNTER — Telehealth (INDEPENDENT_AMBULATORY_CARE_PROVIDER_SITE_OTHER): Payer: Self-pay | Admitting: Internal Medicine

## 2009-09-21 ENCOUNTER — Telehealth (INDEPENDENT_AMBULATORY_CARE_PROVIDER_SITE_OTHER): Payer: Self-pay | Admitting: *Deleted

## 2009-09-23 ENCOUNTER — Ambulatory Visit: Payer: Self-pay | Admitting: Internal Medicine

## 2009-09-23 LAB — CONVERTED CEMR LAB: HIV 1 RNA Quant: <48 copies/mL (ref <48)

## 2009-10-07 ENCOUNTER — Ambulatory Visit: Payer: Self-pay | Admitting: Cardiology

## 2009-10-08 ENCOUNTER — Ambulatory Visit: Payer: Self-pay | Admitting: Internal Medicine

## 2009-10-08 ENCOUNTER — Encounter (INDEPENDENT_AMBULATORY_CARE_PROVIDER_SITE_OTHER): Payer: Self-pay | Admitting: *Deleted

## 2009-10-30 ENCOUNTER — Encounter: Payer: Self-pay | Admitting: *Deleted

## 2009-11-13 ENCOUNTER — Encounter (INDEPENDENT_AMBULATORY_CARE_PROVIDER_SITE_OTHER): Payer: Self-pay | Admitting: *Deleted

## 2009-11-16 ENCOUNTER — Encounter: Payer: Self-pay | Admitting: Cardiology

## 2009-11-16 ENCOUNTER — Ambulatory Visit: Payer: Self-pay

## 2009-11-16 ENCOUNTER — Ambulatory Visit: Payer: Self-pay | Admitting: Internal Medicine

## 2009-11-16 ENCOUNTER — Ambulatory Visit (HOSPITAL_COMMUNITY): Admission: RE | Admit: 2009-11-16 | Discharge: 2009-11-16 | Payer: Self-pay | Admitting: Cardiology

## 2009-12-25 ENCOUNTER — Telehealth: Payer: Self-pay | Admitting: Infectious Diseases

## 2010-03-19 ENCOUNTER — Encounter: Payer: Self-pay | Admitting: Internal Medicine

## 2010-03-19 ENCOUNTER — Inpatient Hospital Stay (HOSPITAL_COMMUNITY): Admission: EM | Admit: 2010-03-19 | Discharge: 2010-03-22 | Payer: Self-pay | Admitting: Emergency Medicine

## 2010-03-19 ENCOUNTER — Ambulatory Visit: Payer: Self-pay | Admitting: Internal Medicine

## 2010-03-22 ENCOUNTER — Encounter: Payer: Self-pay | Admitting: Internal Medicine

## 2010-03-23 ENCOUNTER — Telehealth (INDEPENDENT_AMBULATORY_CARE_PROVIDER_SITE_OTHER): Payer: Self-pay | Admitting: Internal Medicine

## 2010-03-24 ENCOUNTER — Telehealth (INDEPENDENT_AMBULATORY_CARE_PROVIDER_SITE_OTHER): Payer: Self-pay | Admitting: *Deleted

## 2010-03-26 ENCOUNTER — Ambulatory Visit: Payer: Self-pay | Admitting: Cardiology

## 2010-04-01 ENCOUNTER — Ambulatory Visit: Payer: Self-pay | Admitting: Internal Medicine

## 2010-04-01 ENCOUNTER — Encounter (INDEPENDENT_AMBULATORY_CARE_PROVIDER_SITE_OTHER): Payer: Self-pay | Admitting: *Deleted

## 2010-04-01 LAB — CONVERTED CEMR LAB
ALT: 34 units/L (ref 0–53)
BUN: 32 mg/dL — ABNORMAL HIGH (ref 6–23)
CO2: 20 meq/L (ref 19–32)
Calcium: 9.4 mg/dL (ref 8.4–10.5)
Chloride: 107 meq/L (ref 96–112)
Chloride: 102 meq/L (ref 96–112)
Cholesterol: 145 mg/dL (ref 0–200)
Creatinine, Ser: 1.6 mg/dL — ABNORMAL HIGH (ref 0.4–1.5)
Free T4: 1.41 ng/dL (ref 0.80–1.80)
GFR calc non Af Amer: 55.56 mL/min (ref >60)
HIV 1 RNA Quant: <48 copies/mL (ref <48)
Hgb A1c MFr Bld: 6 %
Lymphocytes Relative: 53 % — ABNORMAL HIGH (ref 12–46)
Lymphs Abs: 2.5 10*3/uL (ref 0.7–4.0)
Monocytes Relative: 10 % (ref 3–12)
Neutro Abs: 1.5 10*3/uL — ABNORMAL LOW (ref 1.7–7.7)
Neutrophils Relative %: 32 % — ABNORMAL LOW (ref 43–77)
RBC: 4.34 M/uL (ref 4.22–5.81)
RPR Ser Ql: REACTIVE — AB
Sodium: 132 meq/L — ABNORMAL LOW (ref 135–145)
T pallidum Antibodies (TP-PA): >8 — ABNORMAL HIGH (ref <0.90)
TSH: 1.082 microintl units/mL (ref 0.350–4.500)
Total Protein: 8.2 g/dL (ref 6.0–8.3)
VLDL: 27 mg/dL (ref 0–40)
WBC: 4.8 10*3/uL (ref 4.0–10.5)

## 2010-04-02 ENCOUNTER — Encounter (INDEPENDENT_AMBULATORY_CARE_PROVIDER_SITE_OTHER): Payer: Self-pay | Admitting: Internal Medicine

## 2010-04-02 LAB — CONVERTED CEMR LAB
Creatinine, Urine: 64.6 mg/dL
Microalb, Ur: 4.47 mg/dL — ABNORMAL HIGH (ref 0.00–1.89)

## 2010-04-06 ENCOUNTER — Telehealth (INDEPENDENT_AMBULATORY_CARE_PROVIDER_SITE_OTHER): Payer: Self-pay | Admitting: *Deleted

## 2010-04-08 ENCOUNTER — Telehealth (INDEPENDENT_AMBULATORY_CARE_PROVIDER_SITE_OTHER): Payer: Self-pay | Admitting: *Deleted

## 2010-04-08 ENCOUNTER — Ambulatory Visit: Payer: Self-pay | Admitting: Internal Medicine

## 2010-04-09 LAB — CONVERTED CEMR LAB
BUN: 58 mg/dL — ABNORMAL HIGH (ref 6–23)
Chloride: 103 meq/L (ref 96–112)
Creatinine, Ser: 1.65 mg/dL — ABNORMAL HIGH (ref 0.40–1.50)
Glucose, Bld: 95 mg/dL (ref 70–99)

## 2010-04-20 ENCOUNTER — Ambulatory Visit: Payer: Self-pay | Admitting: Internal Medicine

## 2010-04-20 DIAGNOSIS — N189 Chronic kidney disease, unspecified: Secondary | ICD-10-CM | POA: Insufficient documentation

## 2010-04-22 ENCOUNTER — Ambulatory Visit: Payer: Self-pay | Admitting: Internal Medicine

## 2010-04-23 LAB — CONVERTED CEMR LAB
BUN: 37 mg/dL — ABNORMAL HIGH (ref 6–23)
Calcium: 9.6 mg/dL (ref 8.4–10.5)
Creatinine, Ser: 1.52 mg/dL — ABNORMAL HIGH (ref 0.40–1.50)
Potassium: 4.6 meq/L (ref 3.5–5.3)

## 2010-04-27 ENCOUNTER — Telehealth: Payer: Self-pay | Admitting: *Deleted

## 2010-04-29 ENCOUNTER — Encounter: Payer: Self-pay | Admitting: Cardiology

## 2010-04-30 ENCOUNTER — Encounter: Payer: Self-pay | Admitting: Internal Medicine

## 2010-05-03 ENCOUNTER — Telehealth (INDEPENDENT_AMBULATORY_CARE_PROVIDER_SITE_OTHER): Payer: Self-pay | Admitting: *Deleted

## 2010-05-04 ENCOUNTER — Ambulatory Visit: Payer: Self-pay | Admitting: Cardiology

## 2010-05-24 ENCOUNTER — Telehealth: Payer: Self-pay | Admitting: Internal Medicine

## 2010-05-31 ENCOUNTER — Ambulatory Visit: Payer: Self-pay | Admitting: Internal Medicine

## 2010-05-31 LAB — CONVERTED CEMR LAB
BUN: 22 mg/dL (ref 6–23)
Blood Glucose, Fingerstick: 146
Blood in Urine, dipstick: NEGATIVE
Calcium: 8.9 mg/dL (ref 8.4–10.5)
Creatinine, Ser: 1.16 mg/dL (ref 0.40–1.50)
Glucose, Bld: 126 mg/dL — ABNORMAL HIGH (ref 70–99)
Glucose, Urine, Semiquant: NEGATIVE
Microalb, Ur: 58.28 mg/dL — ABNORMAL HIGH (ref 0.00–1.89)
Nitrite: NEGATIVE
Protein, U semiquant: >=300
Specific Gravity, Urine: >=1.03
Uric Acid, Serum: 4.5 mg/dL (ref 4.0–7.8)
Urobilinogen, UA: 0.2
pH: 5

## 2010-06-02 ENCOUNTER — Telehealth: Payer: Self-pay | Admitting: Licensed Clinical Social Worker

## 2010-06-04 ENCOUNTER — Encounter: Payer: Self-pay | Admitting: Cardiology

## 2010-06-07 ENCOUNTER — Telehealth: Payer: Self-pay | Admitting: Internal Medicine

## 2010-06-22 ENCOUNTER — Telehealth (INDEPENDENT_AMBULATORY_CARE_PROVIDER_SITE_OTHER): Payer: Self-pay | Admitting: *Deleted

## 2010-07-26 ENCOUNTER — Telehealth (INDEPENDENT_AMBULATORY_CARE_PROVIDER_SITE_OTHER): Payer: Self-pay | Admitting: *Deleted

## 2010-08-13 ENCOUNTER — Encounter: Payer: Self-pay | Admitting: Cardiology

## 2010-08-15 ENCOUNTER — Encounter: Payer: Self-pay | Admitting: Cardiology

## 2010-09-09 ENCOUNTER — Encounter: Payer: Self-pay | Admitting: Cardiology

## 2010-09-12 ENCOUNTER — Encounter: Payer: Self-pay | Admitting: Cardiology

## 2010-09-13 ENCOUNTER — Encounter: Payer: Self-pay | Admitting: Internal Medicine

## 2010-09-14 ENCOUNTER — Encounter: Payer: Self-pay | Admitting: Cardiology

## 2010-09-15 ENCOUNTER — Encounter: Payer: Self-pay | Admitting: Cardiology

## 2010-09-16 ENCOUNTER — Encounter: Payer: Self-pay | Admitting: Cardiology

## 2010-09-17 ENCOUNTER — Encounter: Payer: Self-pay | Admitting: Cardiology

## 2010-09-20 ENCOUNTER — Ambulatory Visit: Payer: Self-pay | Admitting: Internal Medicine

## 2010-09-20 DIAGNOSIS — R1032 Left lower quadrant pain: Secondary | ICD-10-CM | POA: Insufficient documentation

## 2010-09-20 LAB — CONVERTED CEMR LAB
AST: 31 units/L (ref 0–37)
Albumin: 3.8 g/dL (ref 3.5–5.2)
BUN: 28 mg/dL — ABNORMAL HIGH (ref 6–23)
CO2: 23 meq/L (ref 19–32)
Calcium: 9.3 mg/dL (ref 8.4–10.5)
Casts: NONE SEEN /lpf
Chloride: 109 meq/L (ref 96–112)
Cholesterol: 141 mg/dL (ref 0–200)
Creatinine, Ser: 1.3 mg/dL (ref 0.40–1.50)
Crystals: NONE SEEN
HDL: 39 mg/dL — ABNORMAL LOW (ref >39)
HIV 1 RNA Quant: <20 copies/mL (ref <20)
Hemoglobin, Urine: NEGATIVE
Ketones, ur: NEGATIVE mg/dL
Leukocytes, UA: NEGATIVE
Nitrite: NEGATIVE
Potassium: 4.3 meq/L (ref 3.5–5.3)
Specific Gravity, Urine: 1.023 (ref 1.005–1.0)
Total CHOL/HDL Ratio: 3.6
Urine Glucose: NEGATIVE mg/dL
pH: 5.5 (ref 5.0–8.0)

## 2010-09-21 ENCOUNTER — Encounter (INDEPENDENT_AMBULATORY_CARE_PROVIDER_SITE_OTHER): Payer: Self-pay | Admitting: *Deleted

## 2010-09-23 ENCOUNTER — Telehealth (INDEPENDENT_AMBULATORY_CARE_PROVIDER_SITE_OTHER): Payer: Self-pay | Admitting: *Deleted

## 2010-09-27 ENCOUNTER — Encounter: Payer: Self-pay | Admitting: Internal Medicine

## 2010-10-05 ENCOUNTER — Ambulatory Visit: Payer: Self-pay | Admitting: Internal Medicine

## 2010-11-02 ENCOUNTER — Ambulatory Visit
Admission: RE | Admit: 2010-11-02 | Discharge: 2010-11-02 | Payer: Self-pay | Source: Home / Self Care | Attending: Cardiology | Admitting: Cardiology

## 2010-11-23 NOTE — Assessment & Plan Note (Signed)
Summary: 2 week f/u per dr pokharel/cfb   Vital Signs:  Patient profile:   60 year old male Height:      74 inches (187.96 cm) Weight:      188.0 pounds (75.45 kg) BMI:     21.39 Temp:     97.3 degrees F (36.28 degrees C) oral Pulse rate:   67 / minute BP sitting:   129 / 76  (right arm) Cuff size:   regular  Vitals Entered By: Lucky Rathke NT II (April 22, 2010 3:34 PM) CC: PATIENT STATES IS HERE JUST FOR TEST RESULTS / NEED MEDICATION REFILL WHILE HERE Is Patient Diabetic? Yes Did you bring your meter with you today? No Pain Assessment Patient in pain? no      Nutritional Status BMI of < 19 = underweight  Have you ever been in a relationship where you felt threatened, hurt or afraid?No   Does patient need assistance? Functional Status Self care Ambulation Normal Comments HERE FOR TEST RESULTS ONLY,  ALSO NEEDS  MEDICATION REFILL   Primary Care Provider:  Alver Fisher MD  CC:  PATIENT STATES IS HERE JUST FOR TEST RESULTS / NEED MEDICATION REFILL WHILE HERE.  History of Present Illness: [name] is a 60 yo man with PMH as outlined in chart.  He is here for follow up of renal insufficiency.  He had a pork skin and beer binge for a bit of time and thinks this may have contributed.  He was taken off lasix, K, and enalapril.  His Cr has since improved some.  Otherwise he is without complaints.   Preventive Screening-Counseling & Management  Alcohol-Tobacco     Alcohol drinks/day: 0     Alcohol type: stopped on Saturday     Smoking Status: current     Smoking Cessation Counseling: yes     Smoke Cessation Stage: contemplative     Packs/Day: <0.25     Year Started: 20 years ago     Cans of tobacco/week: no     Passive Smoke Exposure: yes  Caffeine-Diet-Exercise     Caffeine use/day: 0     Does Patient Exercise: yes     Type of exercise: walking     Exercise (avg: min/session): <30     Times/week: 5  Current Medications (verified): 1)  Combivir 150-300 Mg Tabs  (Lamivudine-Zidovudine) .... Take 1 Tablet By Mouth Two Times A Day 2)  Reyataz 200 Mg Caps (Atazanavir Sulfate) .... Take 2 Tablets By Mouth Once A Day in The Morning. 3)  Allopurinol 300 Mg Tabs (Allopurinol) .... Take 1 Tablet By Mouth Once A Day 4)  Aspirin 325 Mg Tabs (Aspirin) .... Take 1 Tablet By Mouth Once A Day 5)  Coreg 25 Mg  Tabs (Carvedilol) .... Take 1 Tablet By Mouth Two Times A Day 6)  Glucophage 500 Mg  Tabs (Metformin Hcl) .... Take 1 Tablet By Mouth Two Times A Day 7)  Glipizide 5 Mg  Tabs (Glipizide) .... Take 1 Tablet By Mouth Once A Day 8)  Lipitor 40 Mg Tabs (Atorvastatin Calcium) .... Take 1 Tablet By Mouth Once A Day 9)  Nitroquick 0.3 Mg  Subl (Nitroglycerin) .... As Needed 10)  Naftin-Mp 1 %  Crea (Naftifine Hcl) .... Apply Once Daily As Needed  Allergies (verified): No Known Drug Allergies  Past History:  Past Medical History: Last updated: 12/29/2008 syphilis Coronary artery disease s/p surgery Diabetes mellitus, type II HIV disease Allergic rhinitis Gout Hyperlipidemia Hypertension Ischemic cardiomyopathy.  EF [id number] Chronic systolic heart failure Peripheral neuropathy History of drug abuse.  He has tested for cocaine as recently as February of 2008.  He says he is not using drugs now.  We avoided a defibrillator for this reason. History of Hepatitis C  Past Surgical History: Last updated: 27-Jan-2009 Coronary artery bypass graft Left intertrochanteric hip fracture, status post intermedullary nail placement February 2008 History of left transverse mandibular fracture, October 2005. History of laparoscopic cholecystectomy, August 2007.  Family History: Last updated: 2009-01-27 Father: died of heart failure Brother: DIed of MI, also did cocaine  Social History: Last updated: 04/20/2009 Retired Current Smoker Drug use-no none since 2008 Alcohol Use - yes  Risk Factors: Alcohol Use: 0 (04/22/2010) Caffeine Use: 0 (04/22/2010) Exercise: yes  (04/22/2010)  Risk Factors: Smoking Status: current (04/22/2010) Packs/Day: <0.25 (04/22/2010) Cans of tobacco/wk: no (04/22/2010) Passive Smoke Exposure: yes (04/22/2010)  Review of Systems      See HPI  Physical Exam  General:  alert, well-developed, normal appearance, and cooperative to examination.   Eyes:  anicteric Lungs:  normal respiratory effort and no accessory muscle use.   Neurologic:  alert & oriented X3 and gait normal.   Psych:  Oriented X3, memory intact for recent and remote, and normally interactive.     Impression & Recommendations:  Problem # 1:  RENAL INSUFFICIENCY, ACUTE (ICD-585.9)  expect beer with some dehydration in setting of lasix and ACE-I responsible However, if does not improve back to baseline may be new baseline also, if does not improve back to normal, will have to consider stopping metformin if resolves, will restart enalapril low dose on RTC in 1 month  Orders: T-Basic Metabolic Panel ([id number])  Labs Reviewed: BUN: 58 (04/08/2010)   Cr: 1.65 (04/08/2010)    Hgb: 15.8 (04/01/2010)   Hct: 47.0 (04/01/2010)   Ca++: 9.3 (04/08/2010)    TP: 8.2 (04/01/2010)   Alb: 3.8 (04/01/2010) HBSAg: NO (12/18/2006)   HBSAb: NO (12/18/2006)  Problem # 2:  DIABETES MELLITUS, TYPE II (ICD-250.00)  will refill metformin however, if Cr does not return to baseline may have to d/c  His updated medication list for this problem includes:    Aspirin 325 Mg Tabs (Aspirin) .[name]... Take 1 tablet by mouth once a day    Glucophage 500 Mg Tabs (Metformin hcl) .[name]... Take 1 tablet by mouth two times a day    Glipizide 5 Mg Tabs (Glipizide) .[name]... Take 1 tablet by mouth once a day  Labs Reviewed: Creat: 1.65 (04/08/2010)     Last Eye Exam: No diabetic retinopathy.  ou  (02/13/2009) Reviewed HgBA1c results: 6.0 (04/01/2010)  5.7 (03/31/2009)  Problem # 3:  HYPERTENSION (ICD-401.9) renains well controlled see above  His updated medication list for this  problem includes:    Coreg 25 Mg Tabs (Carvedilol) .[name]... Take 1 tablet by mouth two times a day  BP today: 129/76 Prior BP: 118/72 (04/20/2010)  Labs Reviewed: K+: 5.5 (04/08/2010) Creat: : 1.65 (04/08/2010)   Chol: 145 (04/01/2010)   HDL: 35 (04/01/2010)   LDL: 83 (04/01/2010)   TG: 136 (04/01/2010)  Complete Medication List: 1)  Combivir 150-300 Mg Tabs (Lamivudine-zidovudine) .... Take 1 tablet by mouth two times a day 2)  Reyataz 200 Mg Caps (Atazanavir sulfate) .... Take 2 tablets by mouth once a day in the morning. 3)  Allopurinol 300 Mg Tabs (Allopurinol) .... Take 1 tablet by mouth once a day 4)  Aspirin 325 Mg Tabs (Aspirin) .... Take 1 tablet by  mouth once a day 5)  Coreg 25 Mg Tabs (Carvedilol) .... Take 1 tablet by mouth two times a day 6)  Glucophage 500 Mg Tabs (Metformin hcl) .... Take 1 tablet by mouth two times a day 7)  Glipizide 5 Mg Tabs (Glipizide) .... Take 1 tablet by mouth once a day 8)  Lipitor 40 Mg Tabs (Atorvastatin calcium) .... Take 1 tablet by mouth once a day 9)  Nitroquick 0.3 Mg Subl (Nitroglycerin) .... As needed 10)  Naftin-mp 1 % Crea (Naftifine hcl) .... Apply once daily as needed  Patient Instructions: 1)  Please schedule a follow-up appointment in 1 month. 2)  Do not start potassium, lasix or enalapril yet. 3)  Will check labs today, if there is any problem we will call. 4)  Have refilled metformin, you should not take this medicine if your kidney function does not improve. 5)  Continue medications below 6)  If you have any problems before your next appointment, call clinic. Prescriptions: GLUCOPHAGE 500 MG  TABS (METFORMIN HCL) Take 1 tablet by mouth two times a day  #60 x 0   Entered and Authorized by:   Alphia Moh MD   Signed by:   Alphia Moh MD on 04/22/2010   Method used:   Print then Give to Patient   RxID:   [id number]  Process Orders Check Orders Results:     [facility]: Check successful Tests  Sent for requisitioning (April 22, 2010 4:18 PM):     04/22/2010: [facility] -- T-Basic Metabolic Panel 0000000 (signed)    Prevention & Chronic Care Immunizations   Influenza vaccine: Historical  (08/03/2009)   Influenza vaccine deferral: Deferred  (04/01/2010)    Tetanus booster: Not documented   Td booster deferral: Deferred  (04/01/2010)    Pneumococcal vaccine: Historical  (08/28/2006)   Pneumococcal vaccine deferral: Deferred  (04/01/2010)    H. zoster vaccine: Not documented   H. zoster vaccine deferral: Deferred  (04/01/2010)  Colorectal Screening   Hemoccult: Not documented    Colonoscopy: Not documented   Colonoscopy action/deferral: Deferred  (04/01/2010)  Other Screening   PSA: Not documented   PSA action/deferral: Discussion deferred  (04/01/2010)   Smoking status: current  (04/22/2010)   Smoking cessation counseling: yes  (04/22/2010)  Diabetes Mellitus   HgbA1C: 6.0  (04/01/2010)   Hemoglobin A1C due: 12/05/2006    Eye exam: No diabetic retinopathy.  ou   (02/13/2009)   Eye exam due: 02/2010    Foot exam: yes  (04/01/2010)   High risk foot: Not documented   Foot care education: Not documented    Urine microalbumin/creatinine ratio: 69.2  (04/02/2010)   Urine microalbumin/cr due: 11/22/2007  Lipids   Total Cholesterol: 145  (04/01/2010)   LDL: 83  (04/01/2010)   LDL Direct: Not documented   HDL: 35  (04/01/2010)   Triglycerides: 136  (04/01/2010)   Lipid panel due: 01/04/2008    SGOT (AST): 41  (04/01/2010)   SGPT (ALT): 34  (04/01/2010)   Alkaline phosphatase: 93  (04/01/2010)   Total bilirubin: 0.9  (04/01/2010)  Hypertension   Last Blood Pressure: 129 / 76  (04/22/2010)   Serum creatinine: 1.65  (04/08/2010)   Serum potassium 5.5  (04/08/2010)  Self-Management Support :   Personal Goals (by the next clinic visit) :     Personal A1C goal: 6  (04/01/2010)     Personal blood pressure goal: 130/80  (04/01/2010)      Personal LDL goal: 100  (  04/01/2010)    Patient will work on the following items until the next clinic visit to reach self-care goals:     Medications and monitoring: take my medicines every day, check my blood sugar, bring all of my medications to every visit, examine my feet every day  (04/22/2010)     Eating: drink diet soda or water instead of juice or soda, eat more vegetables, use fresh or frozen vegetables, eat foods that are low in salt, eat baked foods instead of fried foods, eat fruit for snacks and desserts, limit or avoid alcohol  (04/22/2010)     Activity: take a 30 minute walk every day, park at the far end of the parking lot  (04/01/2010)    Diabetes self-management support: Resources for patients handout  (04/22/2010)   Last diabetes self-management training by diabetes educator: 04/06/2009   Last medical nutrition therapy: 07/12/2007    Hypertension self-management support: Resources for patients handout  (04/22/2010)    Lipid self-management support: Resources for patients handout  (04/22/2010)        Resource handout printed.

## 2010-11-23 NOTE — Progress Notes (Signed)
Summary: diabetes/nutrition support/dmr  Phone Note Call from Patient Call back at Home Phone [phone]   Caller: Patient Summary of Call: just got out of hospital for congestive heart failure- Doesn't know what to eat- can he have french fries with no salt? please call. can be reached on his cell phone after noon: [phone] Initial call taken by: Barnabas Harries RD,CDE,  March 24, 2010 5:18 PM  Follow-up for Phone Call        returned call- discussed a few changes he could make to decrease his reported high salt choices. patiwent says he will make an apointment to discuss in more detail. Follow-up by: Barnabas Harries RD,CDE,  March 24, 2010 5:35 PM

## 2010-11-23 NOTE — Assessment & Plan Note (Signed)
Summary: muscle cramps/pcp-?/hla   Vital Signs:  Patient profile:   60 year old male Height:      74 inches (187.96 cm) Weight:      188.1 pounds (85.50 kg) BMI:     24.24 Temp:     97.6 degrees F (36.44 degrees C) oral Pulse rate:   68 / minute Pulse (ortho):   68 / minute BP sitting:   85 / 54  (right arm) BP standing:   89 / 59  Vitals Entered By: Hilda Blades Ditzler RN (April 01, 2010 9:36 AM)  Serial Vital Signs/Assessments:  Time      Position  BP       Pulse  Resp  Temp     By 10AM      Lying RA  97/65    Thurston RN 10AM      Sitting   93/63    Riddle RN 10AM      Standing  89/59    68                    Debra Ditzler RN  Is Patient Diabetic? Yes Did you bring your meter with you today? No Pain Assessment Patient in pain? no      Nutritional Status BMI of 19 -24 = normal Nutritional Status Detail appetite good CBG Result 101  Have you ever been in a relationship where you felt threatened, hurt or afraid?denies   Does patient need assistance? Functional Status Self care Ambulation Normal Comments Since 02/2010 c/o green, brown and clear productive cough - pharm stopped Allegera. Occ pain right side of neck. Ck left leg - spots that itch.   Primary Care Provider:  Alver Fisher MD   History of Present Illness: Mr. Decrescenzo comes today for a f/u  visit. He was admitted at the end of May for mild exacerbation of his CHF and it seems like he also had mild PNA.   1. DM: He takes glipizide and metformin. He checks CBG two times a day. It runs like 99-130, he didnot bring his meter. He is supposed to see his eye MD next year. He says he is supposed to see them once every 2 yrs a/t a letter he received from his 66 MD.   2. HL: He takes his cholesterol. He ate a small piece of baked pork chop and a bagel with a little butter spread and a cup of coffee.   3. HTN: He takes lasix, vasotec and coreg. No dizziness.   4.  Tobacco abuse: He continues to smoke. He did not like chantix and patch did not work. No depression or SI.   5. Cough: It is stable. He has some green/brown sputum.  6. CHF: He followed up with Dr. Verl Blalock after being discharged from the hospital.   Depression History:      The patient denies a depressed mood most of the day and a diminished interest in his usual daily activities.         Preventive Screening-Counseling & Management  Alcohol-Tobacco     Alcohol drinks/day: 2     Alcohol type: beer     Smoking Status: current     Smoking Cessation Counseling: yes  Smoke Cessation Stage: precontemplative     Packs/Day: 1.0     Year Started: 20 years ago     Cans of tobacco/week: no     Passive Smoke Exposure: yes  Caffeine-Diet-Exercise     Caffeine use/day: 0     Does Patient Exercise: yes     Type of exercise: walking     Times/week: 5  Current Medications (verified): 1)  Combivir 150-300 Mg Tabs (Lamivudine-Zidovudine) .... Take 1 Tablet By Mouth Two Times A Day 2)  Reyataz 200 Mg Caps (Atazanavir Sulfate) .... Take 2 Tablets By Mouth Once A Day in The Morning. 3)  Allopurinol 300 Mg Tabs (Allopurinol) .... Take 1 Tablet By Mouth Once A Day 4)  Aspirin 325 Mg Tabs (Aspirin) .... Take 1 Tablet By Mouth Once A Day 5)  Vasotec 10 Mg Tabs (Enalapril Maleate) .... Take 1 Pill By Mouth Two Times A Day. 6)  Coreg 25 Mg  Tabs (Carvedilol) .... Take 1 Tablet By Mouth Two Times A Day 7)  Glucophage 500 Mg  Tabs (Metformin Hcl) .... Take 1 Tablet By Mouth Two Times A Day 8)  Glipizide 5 Mg  Tabs (Glipizide) .... Take 1 Tablet By Mouth Once A Day 9)  Lipitor 40 Mg Tabs (Atorvastatin Calcium) .... Take 1 Tablet By Mouth Once A Day 10)  Nitroquick 0.3 Mg  Subl (Nitroglycerin) .... As Needed 11)  Naftin-Mp 1 %  Crea (Naftifine Hcl) .... Apply Once Daily As Needed 12)  Furosemide 40 Mg Tabs (Furosemide) .... Take 1 Tablet By Mouth Once A Day 13)  Potassium Chloride 20 Meq Pack (Potassium  Chloride) .... Take 1 Tablet By Mouth Once A Day 14)  Buproban 150 Mg Xr12h-Tab (Bupropion Hcl (Smoking Deter)) .... Take 1 Pill By Mouth Daily For 3 Days and Then Two Times A Day.  Allergies: No Known Drug Allergies  Physical Exam  Pulses:  R posterior tibial normal, R dorsalis pedis normal, L posterior tibial normal, and L dorsalis pedis normal.    Diabetes Management Exam:    Foot Exam (with socks and/or shoes not present):       Sensory-Monofilament:          Left foot: normal   Impression & Recommendations:  Problem # 1:  CHRONIC SYSTOLIC HEART FAILURE (123456) Pt is being followed by Dr. Verl Blalock. Today he is euvolumic. His SBP initially was in 80's and repeat was in 90's and there was no significant orthostatic changes or symptoms. Plan is to ask to him take enalapril once a day instead of two times a day and lasix 20 instead of 40 daily and f/u in a week.   His updated medication list for this problem includes:    Aspirin 325 Mg Tabs (Aspirin) .[name]... Take 1 tablet by mouth once a day    Vasotec 10 Mg Tabs (Enalapril maleate) .[name]... Take 1 pill by mouth two times a day.    Coreg 25 Mg Tabs (Carvedilol) .[name]... Take 1 tablet by mouth two times a day    Furosemide 40 Mg Tabs (Furosemide) .[name]... Take 1 tablet by mouth once a day  Echocardiogram:  Study Conclusions    - Left ventricle: LVEF is moderately depressed at approximately 40%     with hypokinesis worse in the anterior, anterolateral walls. The     cavity size was normal. Wall thickness was normal. Doppler     parameters are consistent with a reversible restrictive pattern,     indicative of decreased left  ventricular diastolic compliance     and/or increased left atrial pressure (grade 3 diastolic     dysfunction).   - Mitral valve: Mild regurgitation.   - Left atrium: The atrium was moderately dilated.    --------------------------------------------------------------------   Labs, prior tests, procedures, and  surgery:   Coronary artery bypass grafting.   Transthoracic echocardiography. M-mode, complete 2D, spectral   Doppler, and color Doppler. Height: Height: 188cm. Height: 74in.   Weight: Weight: 91.2kg. Weight: 200.6lb. Body mass index: BMI:   25.8kg/m^2. Body surface area: BSA: 2.20m^2. Blood pressure: 118/66.   Patient status: Outpatient. Location: [facility]  (11/16/2009)  Problem # 2:  TOBACCO ABUSE (ICD-305.1) Pt doesnt want to try chantix and failed on patch. He is willing to try buropion. Will f/u.  His updated medication list for this problem includes:    Buproban 150 Mg Xr12h-tab (Bupropion hcl (smoking deter)) .[name]... Take 1 pill by mouth daily for 3 days and then two times a day.  Problem # 3:  HYPERLIPIDEMIA (B2193296.4) Cont same and check FLP on next appt as he didnot fast this AM.  His updated medication list for this problem includes:    Lipitor 40 Mg Tabs (Atorvastatin calcium) .[name]... Take 1 tablet by mouth once a day  Labs Reviewed: SGOT: 36 (03/03/2009)   SGPT: 33 (03/03/2009)   HDL:36 (04/06/2009), 32 (03/03/2009)  LDL:See Comment mg/dL (04/06/2009), See Comment mg/dL (03/03/2009)  Chol:137 (04/06/2009), 136 (03/03/2009)  Trig:544 (04/06/2009), 414 (03/03/2009)  Problem # 4:  HYPERTENSION (ICD-401.9) Please see CHF.   His updated medication list for this problem includes:    Vasotec 10 Mg Tabs (Enalapril maleate) .[name]... Take 1 pill by mouth two times a day.    Coreg 25 Mg Tabs (Carvedilol) .[name]... Take 1 tablet by mouth two times a day    Furosemide 40 Mg Tabs (Furosemide) .[name]... Take 1 tablet by mouth once a day  Orders: T-TSH [id number]) T-T4, Free [id number])  BP today: 85/54 Prior BP: 102/60 (03/26/2010)  Labs Reviewed: K+: 4.5 (03/26/2010) Creat: : 1.6 (03/26/2010)   Chol: 137 (04/06/2009)   HDL: 36 (04/06/2009)   LDL: See Comment mg/dL (04/06/2009)   TG: 544 (04/06/2009)  Problem # 5:  HIV DISEASE (ICD-042) He has an appt with Dr. [name] on  6/28.  Problem # 6:  DIABETES MELLITUS, TYPE II (ICD-250.00) He did not bring his meter, but according to his description his CBG is well controlled. This goes along with his A1c of 6. Foot exam done today. Encouraged to call optho MD's office.  His updated medication list for this problem includes:    Aspirin 325 Mg Tabs (Aspirin) .[name]... Take 1 tablet by mouth once a day    Vasotec 10 Mg Tabs (Enalapril maleate) .[name]... Take 1 pill by mouth two times a day.    Glucophage 500 Mg Tabs (Metformin hcl) .[name]... Take 1 tablet by mouth two times a day    Glipizide 5 Mg Tabs (Glipizide) .[name]... Take 1 tablet by mouth once a day  Orders: T-Hgb A1C (in-house) [id number]) T-Urine Microalbumin w/creat. ratio [id number])  Complete Medication List: 1)  Combivir 150-300 Mg Tabs (Lamivudine-zidovudine) .... Take 1 tablet by mouth two times a day 2)  Reyataz 200 Mg Caps (Atazanavir sulfate) .... Take 2 tablets by mouth once a day in the morning. 3)  Allopurinol 300 Mg Tabs (Allopurinol) .... Take 1 tablet by mouth once a day 4)  Aspirin 325 Mg Tabs (Aspirin) .... Take 1 tablet by mouth  once a day 5)  Vasotec 10 Mg Tabs (Enalapril maleate) .... Take 1 pill by mouth two times a day. 6)  Coreg 25 Mg Tabs (Carvedilol) .... Take 1 tablet by mouth two times a day 7)  Glucophage 500 Mg Tabs (Metformin hcl) .... Take 1 tablet by mouth two times a day 8)  Glipizide 5 Mg Tabs (Glipizide) .... Take 1 tablet by mouth once a day 9)  Lipitor 40 Mg Tabs (Atorvastatin calcium) .... Take 1 tablet by mouth once a day 10)  Nitroquick 0.3 Mg Subl (Nitroglycerin) .... As needed 11)  Naftin-mp 1 % Crea (Naftifine hcl) .... Apply once daily as needed 12)  Furosemide 40 Mg Tabs (Furosemide) .... Take 1 tablet by mouth once a day 13)  Potassium Chloride 20 Meq Pack (Potassium chloride) .... Take 1 tablet by mouth once a day 14)  Buproban 150 Mg Xr12h-tab (Bupropion hcl (smoking deter)) .... Take 1 pill by mouth daily for 3 days  and then two times a day.  Other Orders: T- Capillary Blood Glucose [id number])  Patient Instructions: 1)  Please follow up in a week.  2)  Please take vasotec once a day only until we see you next.  3)  Please take half of your water pill called lasix until we see you next.  4)  Limit your Sodium (Salt) to less than 2 grams a day(slightly less than 1/2 a teaspoon) to prevent fluid retention, swelling, or worsening of symptoms. 5)  Tobacco is very bad for your health and your loved ones! You Should stop smoking!. 6)  Stop Smoking Tips: Choose a Quit date. Cut down before the Quit date. decide what you will do as a substitute when you feel the urge to smoke(gum,toothpick,exercise). 7)  It is important that you exercise regularly at least 20 minutes 5 times a week. If you develop chest pain, have severe difficulty breathing, or feel very tired , stop exercising immediately and seek medical attention. 8)  You need to lose weight. Consider a lower calorie diet and regular exercise.  9)  It is important that your Diabetic A1c level is checked every 3 months. 10)  See your eye doctor yearly to check for diabetic eye damage. 11)  Check your Blood Pressure regularly. If it is above: you should make an appointment. Prescriptions: BUPROBAN 150 MG XR12H-TAB (BUPROPION HCL (SMOKING DETER)) take 1 pill by mouth daily for 3 days and then two times a day.  #63 x 0   Entered and Authorized by:   Flo Shanks MD   Signed by:   Flo Shanks MD on 04/01/2010   Method used:   Faxed to ...       [facility] (retail)       [address]       [place],   [id number]       Ph: [phone]       Fax: [phone]   RxID:   [id number]  Process Orders Check Orders Results:     [facility]: Check successful Tests Sent for requisitioning (April 01, 2010 1:22 PM):     04/01/2010: [facility] -- T-Urine Microalbumin w/creat. ratio  [[id number]] (signed)     04/01/2010: [facility] -- T-TSH [phone] (signed)     04/01/2010: [facility] -- [place], [place] [[id number]] (signed)    Prevention & Chronic Care Immunizations   Influenza vaccine: Historical  (08/03/2009)  Influenza vaccine deferral: Deferred  (04/01/2010)    Tetanus booster: Not documented   Td booster deferral: Deferred  (04/01/2010)    Pneumococcal vaccine: Historical  (08/28/2006)   Pneumococcal vaccine deferral: Deferred  (04/01/2010)    H. zoster vaccine: Not documented   H. zoster vaccine deferral: Deferred  (04/01/2010)  Colorectal Screening   Hemoccult: Not documented    Colonoscopy: Not documented   Colonoscopy action/deferral: Deferred  (04/01/2010)  Other Screening   PSA: Not documented   PSA action/deferral: Discussion deferred  (04/01/2010)   Smoking status: current  (04/01/2010)   Smoking cessation counseling: yes  (04/01/2010)  Diabetes Mellitus   HgbA1C: 6.0  (04/01/2010)   Hemoglobin A1C due: 12/05/2006    Eye exam: No diabetic retinopathy.  ou   (02/13/2009)   Eye exam due: 02/2010    Foot exam: yes  (04/01/2010)   High risk foot: Not documented   Foot care education: Not documented    Urine microalbumin/creatinine ratio: 588.3  (03/31/2009)   Urine microalbumin/cr due: 11/22/2007    Diabetes flowsheet reviewed?: Yes   Progress toward A1C goal: Unchanged  Lipids   Total Cholesterol: 137  (04/06/2009)   LDL: See Comment mg/dL  (04/06/2009)   LDL Direct: Not documented   HDL: 36  (04/06/2009)   Triglycerides: 544  (04/06/2009)   Lipid panel due: 01/04/2008    SGOT (AST): 36  (03/03/2009)   SGPT (ALT): 33  (03/03/2009)   Alkaline phosphatase: 83  (03/03/2009)   Total bilirubin: 1.2  (03/03/2009)    Lipid flowsheet reviewed?: Yes   Progress toward LDL goal: Unchanged  Hypertension   Last Blood Pressure: 85 / 54  (04/01/2010)   Serum creatinine: 1.6  (03/26/2010)    Serum potassium 4.5  (03/26/2010)    Hypertension flowsheet reviewed?: Yes   Progress toward BP goal: Deteriorated  Self-Management Support :   Personal Goals (by the next clinic visit) :     Personal A1C goal: 6  (04/01/2010)     Personal blood pressure goal: 130/80  (04/01/2010)     Personal LDL goal: 100  (04/01/2010)    Patient will work on the following items until the next clinic visit to reach self-care goals:     Medications and monitoring: take my medicines every day, check my blood sugar, bring all of my medications to every visit, examine my feet every day  (04/01/2010)     Eating: eat more vegetables, use fresh or frozen vegetables, eat foods that are low in salt, eat baked foods instead of fried foods, eat fruit for snacks and desserts  (04/01/2010)     Activity: take a 30 minute walk every day, park at the far end of the parking lot  (04/01/2010)    Diabetes self-management support: Written self-care plan, Education handout, Resources for patients handout  (04/01/2010)   Diabetes care plan printed   Diabetes education handout printed   Last diabetes self-management training by diabetes educator: 04/06/2009   Last medical nutrition therapy: 07/12/2007    Hypertension self-management support: Written self-care plan, Education handout, Resources for patients handout  (04/01/2010)   Hypertension self-care plan printed.   Hypertension education handout printed    Lipid self-management support: Written self-care plan, Education handout, Resources for patients handout  (04/01/2010)   Lipid self-care plan printed.   Lipid education handout printed      Resource handout printed.  Laboratory Results   Blood Tests   Date/Time Received: April 01, 2010 9:53 AM  Date/Time  Reported: Lenoria Farrier  April 01, 2010 9:53 AM   HGBA1C: 6.0%   (Normal Range: Non-Diabetic - 3-6%   Control Diabetic - 6-8%) CBG Random:: 101mg /dL      Process Orders Check Orders Results:      [facility]: Check successful Tests Sent for requisitioning (April 01, 2010 1:22 PM):     04/01/2010: [facility] -- T-Urine Microalbumin w/creat. ratio [[id number]] (signed)     04/01/2010: [facility] -- T-TSH [phone] (signed)     04/01/2010: [facility] -- T-T4, [place] [[id number]] (signed)     Last LDL:                                                 See Comment mg/dL (04/06/2009 8:00:00 PM)        Diabetic Foot Exam Foot Inspection Is there a history of a foot ulcer?              No Is there a foot ulcer now?              No Can the patient see the bottom of their feet?          Yes Are the shoes appropriate in style and fit?          Yes Is there swelling or an abnormal foot shape?          No Are the toenails long?                No Are the toenails thick?                Yes Are the toenails ingrown?              No Is there heavy callous build-up?              Yes Is there a claw toe deformity?                          No Is there elevated skin temperature?            No Is there limited ankle dorsiflexion?            No Is there foot or ankle muscle weakness?            No Do you have pain in calf while walking?           No         10-g (5.07) Semmes-Weinstein Monofilament Test Performed by: Hilda Blades Ditzler RN          Right Foot          Left Foot Visual Inspection     normal            Test Control      normal         normal Site 1         normal         normal Site 2         abnormal         normal Site 3         abnormal         normal Site 4         normal  normal Site 5         normal         normal Site 6         normal         normal Site 7         normal         normal Site 8         normal         normal Site 9         normal         normal Site 10         normal         normal  Impression                normal

## 2010-11-23 NOTE — Letter (Signed)
Summary: [facility]   Imported By: Enedina Finner 09/21/2010 11:30:18  _____________________________________________________________________  External Attachment:    Type:   Image     Comment:   External Document

## 2010-11-23 NOTE — Progress Notes (Signed)
Summary: DSMT &  MNT support/dmr  Phone Note Outgoing Call   Call placed by: Barnabas Harries RD,CDE,  April 08, 2010 3:14 PM Summary of Call: returned patient voicemail: call back # [phone]: ? hamburger meat okay if it has 23 grams of saturated fat, shold he add ground [place] to it? I afreed that adding ground [place] breast or ground [place] would help decrease the sat. fat and be healthier choice for meatloaf or burgers. WE again discussed salt free seasonings he could use.

## 2010-11-23 NOTE — Assessment & Plan Note (Signed)
Summary: PER CHECK OUT/SF    Visit Type:  rov Primary Provider:  Alver Fisher MD  CC:  no cardiac complaints today..pt lost 12 lb's since 09/2009.  History of Present Illness: Jacob [name] comes in today for post hospital visit, where he presented with acute systolic congestive heart failure.  Most of this was due to dietary indiscretion, continue smoking, as he was found to have bronchitis and possible pneumonia as well.  His ejection fraction was around 40% in January 2011. His mild mitral regurgitation, diastolic dysfunction, and diffuse hypokinesia particularly the anterior and anterolateral Jacob [name]. He is a moderately dilated left atrium.  He continues to smoke. He had 2 sausage biscuits this morning at 2 AM.  His only new medication is Lasix 40 mg once a day potassium 20 mg once a day. He is finishing out Avelox.  He's had previous bypass surgery subsequent catheter in 2006 showed grafts to be patent.  Current Medications (verified): 1)  Combivir 150-300 Mg Tabs (Lamivudine-Zidovudine) .... Take 1 Tablet By Mouth Two Times A Day 2)  Reyataz 200 Mg Caps (Atazanavir Sulfate) .... Take 2 Tablets By Mouth Once A Day in The Morning. 3)  Allopurinol 300 Mg Tabs (Allopurinol) .... Take 1 Tablet By Mouth Once A Day 4)  Aspirin 325 Mg Tabs (Aspirin) .... Take 1 Tablet By Mouth Once A Day 5)  Vasotec 10 Mg Tabs (Enalapril Maleate) .... Take 1 Pill By Mouth Two Times A Day. 6)  Coreg 25 Mg  Tabs (Carvedilol) .... Take 1 Tablet By Mouth Two Times A Day 7)  Glucophage 500 Mg  Tabs (Metformin Hcl) .... Take 1 Tablet By Mouth Two Times A Day 8)  Glipizide 5 Mg  Tabs (Glipizide) .... Take 1 Tablet By Mouth Once A Day 9)  Lipitor 40 Mg Tabs (Atorvastatin Calcium) .... Take 1 Tablet By Mouth Once A Day 10)  Allegra 180 Mg Tabs (Fexofenadine Hcl) .... Take 1 Tablet By Mouth Once A Day 11)  Nitroquick 0.3 Mg  Subl (Nitroglycerin) .... As Needed 12)  Naftin-Mp 1 %  Crea (Naftifine Hcl) .... Apply Once  Daily As Needed 13)  Furosemide 40 Mg Tabs (Furosemide) .... Take 1 Tablet By Mouth Once A Day 14)  Avelox 400 Mg Tabs (Moxifloxacin Hcl) .... Take 1 Tablet By Mouth Once A Day For 5 Days. 15)  Potassium Chloride 20 Meq Pack (Potassium Chloride) .... Take 1 Tablet By Mouth Once A Day  Allergies: No Known Drug Allergies  Past History:  Past Medical History: Last updated: January 12, 2009 syphilis Coronary artery disease s/p surgery Diabetes mellitus, type II HIV disease Allergic rhinitis Gout Hyperlipidemia Hypertension Ischemic cardiomyopathy.  EF [id number] Chronic systolic heart failure Peripheral neuropathy History of drug abuse.  He has tested for cocaine as recently as February of 2008.  He says he is not using drugs now.  We avoided a defibrillator for this reason. History of Hepatitis C  Past Surgical History: Last updated: January 12, 2009 Coronary artery bypass graft Left intertrochanteric hip fracture, status post intermedullary nail placement February 2008 History of left transverse mandibular fracture, October 2005. History of laparoscopic cholecystectomy, August 2007.  Family History: Last updated: 01/12/2009 Father: died of heart failure Brother: DIed of MI, also did cocaine  Social History: Last updated: 04/20/2009 Retired Current Smoker Drug use-no none since 2008 Alcohol Use - yes  Risk Factors: Alcohol Use: 2 (10/08/2009) Caffeine Use: 0 (10/08/2009) Exercise: yes (10/08/2009)  Risk Factors: Smoking Status: current (10/08/2009) Packs/Day: 1.0 (10/08/2009) Cans of tobacco/wk:  no (10/08/2009) Passive Smoke Exposure: yes (10/08/2009)  Review of Systems       negative history of present illness  Vital Signs:  Patient profile:   60 year old male Height:      74 inches Weight:      190 pounds BMI:     24.48 Pulse rate:   62 / minute Pulse rhythm:   irregular BP sitting:   102 / 60  (left arm) Cuff size:   large  Vitals Entered By: Julaine Hua, CMA (March 26, 2010 10:31 AM)  Physical Exam  General:  Well developed, well nourished, in no acute distress. Head:  normocephalic and atraumatic Eyes:  PERRLA/EOM intact; conjunctiva and lids normal. Neck:  Neck supple, no JVD. No masses, thyromegaly or abnormal cervical nodes. Lungs:  Clear bilaterally to auscultation and percussion. Heart:  displaced PMI, normal S1-S2, no gallop Msk:  Back normal, normal gait. Muscle strength and tone normal. Pulses:  pulses normal in all 4 extremities Extremities:  No clubbing or cyanosis. Neurologic:  Alert and oriented x 3. Skin:  Intact without lesions or rashes. Psych:  Normal affect.   EKG  Procedure date:  03/26/2010  Findings:      normal sinus rhythm, left atrial enlargement, lateral T-wave changes, stable  Impression & Recommendations:  Problem # 1:  CHRONIC SYSTOLIC HEART FAILURE (123456) I have had a very firm talk and also given information on low sodium diet. Have also strongly encouraged him to stop smoking. We will check electrolytes today. I will see him back in about 4-6 weeks for close followup. Will add spironolactone at that time. His updated medication list for this problem includes:    Aspirin 325 Mg Tabs (Aspirin) .[name]... Take 1 tablet by mouth once a day    Vasotec 10 Mg Tabs (Enalapril maleate) .[name]... Take 1 pill by mouth two times a day.    Coreg 25 Mg Tabs (Carvedilol) .[name]... Take 1 tablet by mouth two times a day    Nitroquick 0.3 Mg Subl (Nitroglycerin) .[name]... As needed    Furosemide 40 Mg Tabs (Furosemide) .[name]... Take 1 tablet by mouth once a day  Problem # 2:  CORONARY ATHEROSCLEROSIS NATIVE CORONARY ARTERY (ICD-414.01)  His updated medication list for this problem includes:    Aspirin 325 Mg Tabs (Aspirin) .[name]... Take 1 tablet by mouth once a day    Vasotec 10 Mg Tabs (Enalapril maleate) .[name]... Take 1 pill by mouth two times a day.    Coreg 25 Mg Tabs (Carvedilol) .[name]... Take 1 tablet by mouth two times a day     Nitroquick 0.3 Mg Subl (Nitroglycerin) .[name]... As needed  Orders: EKG w/ Interpretation (93000)  Problem # 3:  TOBACCO ABUSE (ICD-305.1) Assessment: Unchanged advised to quit  Problem # 4:  HYPERLIPIDEMIA (ICD-272.4)  His updated medication list for this problem includes:    Lipitor 40 Mg Tabs (Atorvastatin calcium) .[name]... Take 1 tablet by mouth once a day  Problem # 5:  HYPERTENSION (ICD-401.9) Assessment: Improved  His updated medication list for this problem includes:    Aspirin 325 Mg Tabs (Aspirin) .[name]... Take 1 tablet by mouth once a day    Vasotec 10 Mg Tabs (Enalapril maleate) .[name]... Take 1 pill by mouth two times a day.    Coreg 25 Mg Tabs (Carvedilol) .[name]... Take 1 tablet by mouth two times a day    Furosemide 40 Mg Tabs (Furosemide) .[name]... Take 1 tablet by mouth once a day  Other Orders: TLB-BMP (Basic Metabolic Panel-BMET) (99991111)  Patient Instructions: 1)  Your physician recommends that you schedule a follow-up appointment in: [place] 2)  Your physician recommends that you return for lab work [id number] BMET 401.1 3)  Your physician recommends that you continue on your current medications as directed. Please refer to the Current Medication list given to you today. 4)  Your physician has requested that you limit the intake of sodium (salt) in your diet to two grams daily. Please see [facility] handout.

## 2010-11-23 NOTE — Progress Notes (Signed)
Summary: new meter message/dmr  Phone Note Call from Patient   Caller: Patient Summary of Call: returned patient message left on voicemail yesterday about a new meter?  Initial call taken by: Barnabas Harries RD,CDE,  September 23, 2010 9:34 AM  Follow-up for Phone Call        spoke with patient who just wanted ot tell us the name if his meter- [employer] presto  Follow-up by: Barnabas Harries RD,CDE,  September 29, 2010 1:42 PM

## 2010-11-23 NOTE — Miscellaneous (Signed)
Summary: clinical update/ryan white  Clinical Lists Changes  Observations: Added new observation of PAYOR: More than 1 (11/13/2009 10:40)

## 2010-11-23 NOTE — Progress Notes (Signed)
Summary: Refill/gh  Phone Note Refill Request Message from:  Fax from Pharmacy on July 26, 2010 12:18 PM  Refills Requested: Medication #1:  GLIPIZIDE 5 MG  TABS Take 1 tablet by mouth once a day   Last Refilled: 06/24/2010 Last office visit and labs 05/31/2010.   Method Requested: Fax to [facility] Initial call taken by: Sander Nephew RN,  July 26, 2010 12:19 PM    Prescriptions: GLIPIZIDE 5 MG  TABS (GLIPIZIDE) Take 1 tablet by mouth once a day  #31 x 11   Entered and Authorized by:   Burman Freestone MD   Signed by:   Burman Freestone MD on 07/26/2010   Method used:   Faxed to ...       [facility] (retail)       [address]       [place], [place]  [address]       Ph: [phone]       Fax: [phone]   RxID:   [id number]

## 2010-11-23 NOTE — Assessment & Plan Note (Signed)
Summary: ACUTE-TO GO OVER RESULTS/MEDICATION QUESTIONS/CFB   Vital Signs:  Patient profile:   60 year old male Height:      74 inches Weight:      192.0 pounds (87.27 kg) BMI:     24.74 Temp:     97.3 degrees F (36.28 degrees C) oral Pulse rate:   64 / minute Resp:     64 per minute BP sitting:   126 / 70  (right arm) Cuff size:   regular  Vitals Entered By: Lucky Rathke NT II (May 31, 2010 10:03 AM) CC: DM / ? MEDICATION CHANGE Is Patient Diabetic? Yes Did you bring your meter with you today? No / BOOK Pain Assessment Patient in pain? no      Nutritional Status BMI of 19 -24 = normal CBG Result 146  Have you ever been in a relationship where you felt threatened, hurt or afraid?No   Does patient need assistance? Functional Status Self care Ambulation Normal   Primary Care Provider:  Alver Fisher MD  CC:  DM / ? MEDICATION CHANGE.  History of Present Illness: Follow up appointment: 1. Renal insufficiency --seen in June 2011; taken off lasix and enalapril at that time. Last Cr 1.52 in June. Denies any oliguria. 2.Gout --takes Allopurionol 300 mg by mouth daily. Dose adjustment due to CRI (?) 3. CHF --next appointment with Dr. Brayton Caves December,2011. 4. DM --controlled. 5. HIV --sees Dr. [name]. VL <48 copies.   Preventive Screening-Counseling & Management  Alcohol-Tobacco     Alcohol drinks/day: 0     Alcohol type: stopped on Saturday     Smoking Status: current     Smoking Cessation Counseling: yes     Smoke Cessation Stage: contemplative     Packs/Day: <0.25     Year Started: 20 years ago     Cans of tobacco/week: no     Passive Smoke Exposure: yes  Caffeine-Diet-Exercise     Caffeine use/day: 0     Does Patient Exercise: yes     Type of exercise: walking     Exercise (avg: min/session): <30     Times/week: 5  Problems Prior to Update: 1)  Special Screening For Malignant Neoplasms Colon  (ICD-V76.51) 2)  Renal Insufficiency, Acute   (ICD-585.9) 3)  Coronary Atherosclerosis Native Coronary Artery  (ICD-414.01) 4)  Chronic Systolic Heart Failure  (123456) 5)  Acute Maxillary Sinusitis  (ICD-461.0) 6)  Foot Pain, Right  (ICD-729.5) 7)  Back Pain, Right  (ICD-724.5) 8)  Fracture, Femur, Intertrochanteric Region, Left  (ICD-820.21) 9)  Tobacco Abuse  (ICD-305.1) 10)  Allergic Rhinitis  (ICD-477.9) 11)  Gout  (ICD-274.9) 12)  Hyperlipidemia  (ICD-272.4) 13)  Diabetic Peripheral Neuropathy  (ICD-250.60) 14)  Atherosclerosis, Coronary, Artery Bypass Grft  (ICD-414.04) 15)  Hypertension  (ICD-401.9) 16)  Syphilis  (ICD-097.9) 17)  HIV Disease  (ICD-042) 18)  Diabetes Mellitus, Type II  (ICD-250.00)  Current Problems (verified): 1)  Renal Insufficiency, Acute  (ICD-585.9) 2)  Coronary Atherosclerosis Native Coronary Artery  (ICD-414.01) 3)  Chronic Systolic Heart Failure  (123456) 4)  Acute Maxillary Sinusitis  (ICD-461.0) 5)  Foot Pain, Right  (ICD-729.5) 6)  Back Pain, Right  (ICD-724.5) 7)  Fracture, Femur, Intertrochanteric Region, Left  (ICD-820.21) 8)  Tobacco Abuse  (ICD-305.1) 9)  Allergic Rhinitis  (ICD-477.9) 10)  Gout  (ICD-274.9) 11)  Hyperlipidemia  (ICD-272.4) 12)  Diabetic Peripheral Neuropathy  (ICD-250.60) 13)  Atherosclerosis, Coronary, Artery Bypass Grft  (ICD-414.04) 14)  Hypertension  (ICD-401.9)  15)  Syphilis  (ICD-097.9) 16)  HIV Disease  (ICD-042) 17)  Diabetes Mellitus, Type II  (ICD-250.00)  Medications Prior to Update: 1)  Combivir 150-300 Mg Tabs (Lamivudine-Zidovudine) .... Take 1 Tablet By Mouth Two Times A Day 2)  Reyataz 200 Mg Caps (Atazanavir Sulfate) .... Take 2 Tablets By Mouth Once A Day in The Morning. 3)  Allopurinol 300 Mg Tabs (Allopurinol) .... Take 1 Tablet By Mouth Once A Day 4)  Aspirin 325 Mg Tabs (Aspirin) .... Take 1 Tablet By Mouth Once A Day 5)  Coreg 25 Mg  Tabs (Carvedilol) .... Take 1 Tablet By Mouth Two Times A Day 6)  Glucophage 500 Mg  Tabs  (Metformin Hcl) .... Take 1 Tablet By Mouth Two Times A Day 7)  Glipizide 5 Mg  Tabs (Glipizide) .... Take 1 Tablet By Mouth Once A Day 8)  Lipitor 40 Mg Tabs (Atorvastatin Calcium) .... Take 1 Tablet By Mouth Once A Day 9)  Nitroquick 0.3 Mg  Subl (Nitroglycerin) .... As Needed 10)  Naftin-Mp 1 %  Crea (Naftifine Hcl) .... Apply Once Daily As Needed  Current Medications (verified): 1)  Combivir 150-300 Mg Tabs (Lamivudine-Zidovudine) .... Take 1 Tablet By Mouth Two Times A Day 2)  Reyataz 200 Mg Caps (Atazanavir Sulfate) .... Take 2 Tablets By Mouth Once A Day in The Morning. 3)  Allopurinol 300 Mg Tabs (Allopurinol) .... Take 1 Tablet By Mouth Once A Day 4)  Aspirin 325 Mg Tabs (Aspirin) .... Take 1 Tablet By Mouth Once A Day 5)  Coreg 25 Mg  Tabs (Carvedilol) .... Take 1 Tablet By Mouth Two Times A Day 6)  Glucophage 500 Mg  Tabs (Metformin Hcl) .... Take 1 Tablet By Mouth Two Times A Day 7)  Glipizide 5 Mg  Tabs (Glipizide) .... Take 1 Tablet By Mouth Once A Day 8)  Lipitor 40 Mg Tabs (Atorvastatin Calcium) .... Take 1 Tablet By Mouth Once A Day 9)  Nitroquick 0.3 Mg  Subl (Nitroglycerin) .... As Needed 10)  Naftin-Mp 1 %  Crea (Naftifine Hcl) .... Apply Once Daily As Needed  Allergies (verified): No Known Drug Allergies  Directives (verified): 1)  Full Code   Past History:  Past Medical History: Last updated: January 19, 2009 syphilis Coronary artery disease s/p surgery Diabetes mellitus, type II HIV disease Allergic rhinitis Gout Hyperlipidemia Hypertension Ischemic cardiomyopathy.  EF [id number] Chronic systolic heart failure Peripheral neuropathy History of drug abuse.  He has tested for cocaine as recently as February of 2008.  He says he is not using drugs now.  We avoided a defibrillator for this reason. History of Hepatitis C  Past Surgical History: Last updated: 01/19/09 Coronary artery bypass graft Left intertrochanteric hip fracture, status post intermedullary nail  placement February 2008 History of left transverse mandibular fracture, October 2005. History of laparoscopic cholecystectomy, August 2007.  Family History: Last updated: 19-Jan-2009 Father: died of heart failure Brother: DIed of MI, also did cocaine  Social History: Last updated: 05/31/2010 Retired Current Smoker Drug use-no none since 2008 Alcohol Use - yes, 4 beeers daily  Risk Factors: Alcohol Use: 0 (05/31/2010) Caffeine Use: 0 (05/31/2010) Exercise: yes (05/31/2010)  Risk Factors: Smoking Status: current (05/31/2010) Packs/Day: <0.25 (05/31/2010) Cans of tobacco/wk: no (05/31/2010) Passive Smoke Exposure: yes (05/31/2010)  Social History: Retired Current Smoker Drug use-no none since 2008 Alcohol Use - yes, 4 beeers daily  Review of Systems       per HPI  Physical Exam  General:  alert, well-developed, normal  appearance, and cooperative to examination.   Head:  Normocephalic and atraumatic without obvious abnormalities. No apparent alopecia or balding. Eyes:  anicteric Ears:  External ear exam shows no significant lesions or deformities.  Otoscopic examination reveals clear canals, tympanic membranes are intact bilaterally without bulging, retraction, inflammation or discharge. Hearing is grossly normal bilaterally. Nose:  External nasal examination shows no deformity or inflammation. Nasal mucosa are pink and moist without lesions or exudates. Mouth:  pharynx pink and moist.   Lungs:  Normal respiratory effort, chest expands symmetrically. Lungs are clear to auscultation, no crackles or wheezes. Heart:  normal rate, regular rhythm, and no murmur.   Pulses:  R posterior tibial normal, R dorsalis pedis normal, L posterior tibial normal, and L dorsalis pedis normal.   Extremities:  trace left pedal edema and 1+ right pedal edema.   Neurologic:  alert & oriented X3 and gait normal.   Skin:  Intact without suspicious lesions or rashes Psych:  Oriented X3, memory  intact for recent and remote, and normally interactive.     Impression & Recommendations:  Problem # 1:  RENAL INSUFFICIENCY, ACUTE (ICD-585.9)  HIV vs diabetic nephropathy(?). Will continue to hold ACEI and lasix. Will await for a B-met results. spoke with Dr. Eppie Gibson -no need to adjust allopurinol yet. Orders: T-Urinalysis Dipstick only [id number]) T-Urine Microalbumin w/creat. ratio (0000000) T-Basic Metabolic Panel (99991111) T-Uric Acid (Blood) [phone])  Labs Reviewed: BUN: 37 (04/22/2010)   Cr: 1.52 (04/22/2010)    Hgb: 15.8 (04/01/2010)   Hct: 47.0 (04/01/2010)   Ca++: 9.6 (04/22/2010)    TP: 8.2 (04/01/2010)   Alb: 3.8 (04/01/2010) HBSAg: NO (12/18/2006)   HBSAb: NO (12/18/2006)  Problem # 2:  GOUT (ICD-274.9) Per above, will check uric acid level.Will await results in order to make decision on whether to continue with a current dose of allopurinol. His updated medication list for this problem includes:    Allopurinol 300 Mg Tabs (Allopurinol) .[name]... Take 1 tablet by mouth once a day  Orders: T-Uric Acid (Blood) [id number])  Problem # 3:  TOBACCO ABUSE (ICD-305.1)  Encouraged smoking cessation and discussed different methods for smoking cessation.   Orders: Social Work Referral (Social )  Problem # 4:  HYPERTENSION (ICD-401.9) Controlled.No change ina regimen. His updated medication list for this problem includes:    Coreg 25 Mg Tabs (Carvedilol) .[name]... Take 1 tablet by mouth two times a day  BP today: 126/70 Prior BP: 120/70 (05/04/2010)  Labs Reviewed: K+: 4.6 (04/22/2010) Creat: : 1.52 (04/22/2010)   Chol: 145 (04/01/2010)   HDL: 35 (04/01/2010)   LDL: 83 (04/01/2010)   TG: 136 (04/01/2010)  Problem # 5:  DIABETES MELLITUS, TYPE II (ICD-250.00) Diet, exercise and foot care discussed. His updated medication list for this problem includes:    Aspirin 325 Mg Tabs (Aspirin) .[name]... Take 1 tablet by mouth once a day    Glucophage 500 Mg Tabs  (Metformin hcl) .[name]... Take 1 tablet by mouth two times a day    Glipizide 5 Mg Tabs (Glipizide) .[name]... Take 1 tablet by mouth once a day  Orders: Capillary Blood Glucose/CBG [id number]) Ophthalmology Referral (Ophthalmology)  Labs Reviewed: Creat: 1.52 (04/22/2010)     Last Eye Exam: No diabetic retinopathy.  ou  (02/13/2009) Reviewed HgBA1c results: 6.0 (04/01/2010)  5.7 (03/31/2009)  Complete Medication List: 1)  Combivir 150-300 Mg Tabs (Lamivudine-zidovudine) .... Take 1 tablet by mouth two times a day 2)  Reyataz 200 Mg Caps (Atazanavir sulfate) .... Take 2 tablets by  mouth once a day in the morning. 3)  Allopurinol 300 Mg Tabs (Allopurinol) .... Take 1 tablet by mouth once a day 4)  Aspirin 325 Mg Tabs (Aspirin) .... Take 1 tablet by mouth once a day 5)  Coreg 25 Mg Tabs (Carvedilol) .... Take 1 tablet by mouth two times a day 6)  Glucophage 500 Mg Tabs (Metformin hcl) .... Take 1 tablet by mouth two times a day 7)  Glipizide 5 Mg Tabs (Glipizide) .... Take 1 tablet by mouth once a day 8)  Lipitor 40 Mg Tabs (Atorvastatin calcium) .... Take 1 tablet by mouth once a day 9)  Nitroquick 0.3 Mg Subl (Nitroglycerin) .... As needed 10)  Naftin-mp 1 % Crea (Naftifine hcl) .... Apply once daily as needed  Other Orders: T-Hemoccult Card-Multiple (take home) [id number]) Gastroenterology Referral (GI)  Patient Instructions: 1)  Please, call tomorrow for the lab results. 2)  Make a follow up appointment in 2 months. 3)  Stop drinking alcohol a discussed. 4)  Call with ay questions. Prescriptions: GLUCOPHAGE 500 MG  TABS (METFORMIN HCL) Take 1 tablet by mouth two times a day  #60 x 11   Entered and Authorized by:   Milana Obey MD   Signed by:   Milana Obey MD on 05/31/2010   Method used:   Faxed to ...       [facility] (retail)       [address]       [place], [place]  [address]       Ph: [phone]       Fax: [phone]   RxID:    [id number]  Process Orders Check Orders Results:     [facility]: Check successful Tests Sent for requisitioning (May 31, 2010 4:26 PM):     05/31/2010: [facility] -- T-Urine Microalbumin w/creat. ratio [[id number]] (signed)     05/31/2010: [facility] -- T-Basic Metabolic Panel 0000000 (signed)     05/31/2010: [facility] -- T-Uric Acid (Blood) [phone] (signed)     Prevention & Chronic Care Immunizations   Influenza vaccine: Historical  (08/03/2009)   Influenza vaccine deferral: Deferred  (04/01/2010)   Influenza vaccine due: 06/24/2010    Tetanus booster: Not documented   Td booster deferral: Deferred  (04/01/2010)   Tetanus booster due: 05/31/2020    Pneumococcal vaccine: Historical  (08/28/2006)   Pneumococcal vaccine deferral: Deferred  (04/01/2010)   Pneumococcal vaccine due: 08/29/2011    H. zoster vaccine: Not documented   H. zoster vaccine deferral: Deferred  (04/01/2010)  Colorectal Screening   Hemoccult: Not documented   Hemoccult action/deferral: Ordered  (05/31/2010)    Colonoscopy: Not documented   Colonoscopy action/deferral: GI referral  (05/31/2010)  Other Screening   PSA: Not documented   PSA action/deferral: Discussed-PSA declined  (05/31/2010)   PSA due due: 06/01/2011   Smoking status: current  (05/31/2010)   Smoking cessation counseling: yes  (05/31/2010)  Diabetes Mellitus   HgbA1C: 6.0  (04/01/2010)   Hemoglobin A1C due: 12/01/2010    Eye exam: No diabetic retinopathy.  ou   (02/13/2009)   Diabetic eye exam action/deferral: Ophthalmology referral  (05/31/2010)   Eye exam due: 02/13/2010    Foot exam: yes  (04/01/2010)   High risk foot: Not documented   Foot care education: Not documented   Foot exam due: 04/02/2011    Urine microalbumin/creatinine ratio: 69.2  (04/02/2010)   Urine microalbumin/cr  due: 04/03/2011    Diabetes flowsheet  reviewed?: Yes   Progress toward A1C goal: Unchanged  Lipids   Total Cholesterol: 145  (04/01/2010)   LDL: 83  (04/01/2010)   LDL Direct: Not documented   HDL: 35  (04/01/2010)   Triglycerides: 136  (04/01/2010)   Lipid panel due: 04/02/2011    SGOT (AST): 41  (04/01/2010)   SGPT (ALT): 34  (04/01/2010)   Alkaline phosphatase: 93  (04/01/2010)   Total bilirubin: 0.9  (04/01/2010)   Liver panel due: 04/02/2011    Lipid flowsheet reviewed?: Yes   Progress toward LDL goal: Unchanged  Hypertension   Last Blood Pressure: 126 / 70  (05/31/2010)   Serum creatinine: 1.52  (04/22/2010)   Serum potassium 4.6  ([id number])   Basic metabolic panel due: [id number]    Hypertension flowsheet reviewed?: Yes   Progress toward BP goal: Unchanged  Self-Management Support :   Personal Goals (by the next clinic visit) :     Personal A1C goal: 6  (04/01/2010)     Personal blood pressure goal: 130/80  (04/01/2010)     Personal LDL goal: 100  (04/01/2010)    Patient will work on the following items until the next clinic visit to reach self-care goals:     Medications and monitoring: take my medicines every day, bring all of my medications to every visit, examine my feet every day  (05/31/2010)     Eating: drink diet soda or water instead of juice or soda, eat more vegetables, use fresh or frozen vegetables, eat foods that are low in salt, eat baked foods instead of fried foods, eat fruit for snacks and desserts, limit or avoid alcohol  (05/31/2010)     Activity: take a 30 minute walk every day  (05/31/2010)    Diabetes self-management support: Resources for patients handout  (05/31/2010)   Last diabetes self-management training by diabetes educator: 04/06/2009   Last medical nutrition therapy: 07/12/2007    Hypertension self-management support: Resources for patients handout  (05/31/2010)    Lipid self-management support: Resources for patients handout  (05/31/2010)        Resource handout  printed.   Nursing Instructions: Provide Hemoccult cards with instructions (see order) GI referral for screening colonoscopy (see order) Refer for screening diabetic eye exam (see order)    Laboratory Results   Urine Tests  Date/Time Received: May 31, 2010 11:04 AM Date/Time Reported: Maryan Rued  May 31, 2010 11:04 AM   Routine Urinalysis   Color: yellow Appearance: Clear Glucose: negative   (Normal Range: Negative) Bilirubin: negative   (Normal Range: Negative) Ketone: negative   (Normal Range: Negative) Spec. Gravity: >=1.030   (Normal Range: 1.003-1.035) Blood: negative   (Normal Range: Negative) pH: 5.0   (Normal Range: 5.0-8.0) Protein: >=300   (Normal Range: Negative) Urobilinogen: 0.2   (Normal Range: 0-1) Nitrite: negative   (Normal Range: Negative) Leukocyte Esterace: negative   (Normal Range: Negative)     Blood Tests     CBG Random:: 146mg /dL

## 2010-11-23 NOTE — Letter (Signed)
Summary: [facility] - Status Report   Imported By: Marilynne Drivers 09/22/2010 14:23:57  _____________________________________________________________________  External Attachment:    Type:   Image     Comment:   External Document

## 2010-11-23 NOTE — Assessment & Plan Note (Signed)
Summary: 6wk f/u sl      Allergies Added: NKDA  Visit Type:  6 wk f/u Primary Provider:  Alver Fisher MD  CC:  pt c/o tingling in his left little(pinky) finger...no other complaints today.  History of Present Illness: [name] returns for evaluation and management his coronary artery disease, ischemic cardiomyopathy, chronic systolic heart failure.  He has not been readmitted with heart failure. He is cut way back on his salt and fat. He has had nutritional counseling and weighs himself on a regular basis and reports 10.  He still drinks 2 beers a day and smokes about a half-pack of cigarettes a day.  He denies orthopnea, PND or edema. He's had no angina.  Current Medications (verified): 1)  Combivir 150-300 Mg Tabs (Lamivudine-Zidovudine) .... Take 1 Tablet By Mouth Two Times A Day 2)  Reyataz 200 Mg Caps (Atazanavir Sulfate) .... Take 2 Tablets By Mouth Once A Day in The Morning. 3)  Allopurinol 300 Mg Tabs (Allopurinol) .... Take 1 Tablet By Mouth Once A Day 4)  Aspirin 325 Mg Tabs (Aspirin) .... Take 1 Tablet By Mouth Once A Day 5)  Coreg 25 Mg  Tabs (Carvedilol) .... Take 1 Tablet By Mouth Two Times A Day 6)  Glucophage 500 Mg  Tabs (Metformin Hcl) .... Take 1 Tablet By Mouth Two Times A Day 7)  Glipizide 5 Mg  Tabs (Glipizide) .... Take 1 Tablet By Mouth Once A Day 8)  Lipitor 40 Mg Tabs (Atorvastatin Calcium) .... Take 1 Tablet By Mouth Once A Day 9)  Nitroquick 0.3 Mg  Subl (Nitroglycerin) .... As Needed 10)  Naftin-Mp 1 %  Crea (Naftifine Hcl) .... Apply Once Daily As Needed  Allergies (verified): No Known Drug Allergies  Past History:  Past Medical History: Last updated: 2009-01-06 syphilis Coronary artery disease s/p surgery Diabetes mellitus, type II HIV disease Allergic rhinitis Gout Hyperlipidemia Hypertension Ischemic cardiomyopathy.  EF [id number] Chronic systolic heart failure Peripheral neuropathy History of drug abuse.  He has tested for cocaine as  recently as February of 2008.  He says he is not using drugs now.  We avoided a defibrillator for this reason. History of Hepatitis C  Past Surgical History: Last updated: 06-Jan-2009 Coronary artery bypass graft Left intertrochanteric hip fracture, status post intermedullary nail placement February 2008 History of left transverse mandibular fracture, October 2005. History of laparoscopic cholecystectomy, August 2007.  Family History: Last updated: 2009-01-06 Father: died of heart failure Brother: DIed of MI, also did cocaine  Social History: Last updated: 04/20/2009 Retired Current Smoker Drug use-no none since 2008 Alcohol Use - yes  Risk Factors: Alcohol Use: 0 (04/22/2010) Caffeine Use: 0 (04/22/2010) Exercise: yes (04/22/2010)  Risk Factors: Smoking Status: current (04/22/2010) Packs/Day: <0.25 (04/22/2010) Cans of tobacco/wk: no (04/22/2010) Passive Smoke Exposure: yes (04/22/2010)  Review of Systems       negative other than history of present illness  Vital Signs:  Patient profile:   60 year old male Height:      74 inches Weight:      186 pounds BMI:     23.97 Pulse rate:   60 / minute Pulse rhythm:   irregular BP sitting:   120 / 70  (left arm) Cuff size:   large  Vitals Entered By: Julaine Hua, CMA (May 04, 2010 3:37 PM)  Physical Exam  General:  thin, chronically ill-appearing Head:  normocephalic and atraumatic Neck:  Neck supple, no JVD. No masses, thyromegaly or abnormal cervical nodes. Chest  Quill Grinder:  no deformities or breast masses noted Lungs:  Clear bilaterally to auscultation and percussion. Heart:  PMI displaced inferolaterally, regular rate and rhythm, no gallop Msk:  Back normal, normal gait. Muscle strength and tone normal. Pulses:  pulses normal in all 4 extremities Extremities:  No clubbing or cyanosis. Neurologic:  Alert and oriented x 3. Skin:  Intact without lesions or rashes. Psych:  Normal affect.   Impression &  Recommendations:  Problem # 1:  CORONARY ATHEROSCLEROSIS NATIVE CORONARY ARTERY (ICD-414.01) Assessment Unchanged  His updated medication list for this problem includes:    Aspirin 325 Mg Tabs (Aspirin) .[name]... Take 1 tablet by mouth once a day    Coreg 25 Mg Tabs (Carvedilol) .[name]... Take 1 tablet by mouth two times a day    Nitroquick 0.3 Mg Subl (Nitroglycerin) .[name]... As needed  His updated medication list for this problem includes:    Aspirin 325 Mg Tabs (Aspirin) .[name]... Take 1 tablet by mouth once a day    Coreg 25 Mg Tabs (Carvedilol) .[name]... Take 1 tablet by mouth two times a day    Nitroquick 0.3 Mg Subl (Nitroglycerin) .[name]... As needed  Orders: EKG w/ Interpretation (93000)  Problem # 2:  CHRONIC SYSTOLIC HEART FAILURE (123456) Assessment: Improved Dietary changes and compliance with his medications have helped. No changes made today. His updated medication list for this problem includes:    Aspirin 325 Mg Tabs (Aspirin) .[name]... Take 1 tablet by mouth once a day    Coreg 25 Mg Tabs (Carvedilol) .[name]... Take 1 tablet by mouth two times a day    Nitroquick 0.3 Mg Subl (Nitroglycerin) .[name]... As needed  Orders: EKG w/ Interpretation (93000)  Problem # 3:  ATHEROSCLEROSIS, CORONARY, ARTERY BYPASS GRFT (ICD-414.04) Assessment: Unchanged  His updated medication list for this problem includes:    Aspirin 325 Mg Tabs (Aspirin) .[name]... Take 1 tablet by mouth once a day    Coreg 25 Mg Tabs (Carvedilol) .[name]... Take 1 tablet by mouth two times a day    Nitroquick 0.3 Mg Subl (Nitroglycerin) .[name]... As needed  Problem # 4:  TOBACCO ABUSE (ICD-305.1) Assessment: Unchanged Advised to quit  Problem # 5:  DIABETES MELLITUS, TYPE II (ICD-250.00)  His updated medication list for this problem includes:    Aspirin 325 Mg Tabs (Aspirin) .[name]... Take 1 tablet by mouth once a day    Glucophage 500 Mg Tabs (Metformin hcl) .[name]... Take 1 tablet by mouth two times a day    Glipizide 5 Mg Tabs (Glipizide)  .[name]... Take 1 tablet by mouth once a day  Patient Instructions: 1)  Your physician recommends that you schedule a follow-up appointment in: 6 months with Dr. Verl Blalock 2)  Your physician recommends that you continue on your current medications as directed. Please refer to the Current Medication list given to you today.  Appended Document: [facility]      Allergies: No Known Drug Allergies   EKG  Procedure date:  05/04/2010  Findings:      normal sinus rhythm, left atrial enlargement, ST segment changes with T wave inversion in the lateral leads, left axis deviation, no acute changes.

## 2010-11-23 NOTE — Progress Notes (Signed)
Summary: testing/ hla  Phone Note Call from Patient   Complaint: Breathing Problems Summary of Call: [employer] called this am stating pt had called and c/o diab supply order form not being filled out properly, states tesing should be twice daily and form was filled out for 1 time daily, can you tell me how many times daily he should be testing and if you got the form?  thanks Initial call taken by: Jacob Finner RN,  April 27, 2010 3:13 PM  Follow-up for Phone Call        My note from over a ayear ago says he was testing 2x a day and the Mail order form filled out by Dr. Rozanna [name] in 03/2009 was completed for 2x a day. I do not see a recent form in the EMR ( however one is due for renewal).   I do not remember his diabetes testing mail order form.  Follow-up by: Jacob [name] RD,CDE,  April 28, 2010 9:14 AM  Additional Follow-up for Phone Call Additional follow up Details #1::        I will approve two times a day testing due to fluctuating kidney function.  Please have patient bring his meter in for download and also keep his appointment for follow-up this month. Additional Follow-up by: Jacob Stakes MD,  April 30, 2010 11:38 AM    Additional Follow-up for Phone Call Additional follow up Details #2::    i spoke w/ pt and advised him to keep his scheduled appt this month and that he would need to bring his meter and all his meds to the appt, he states there is no sense in him bringing his meter to the appt because the clinic can never get the information so he doesn't need to bring it, i informed him that the government states that the md's office must have documentation r/t the # of times told to test to confirm the need and that dr Jacob [name] stated this would be needed at the visit, he was also asked to bring all his meds w/ him to visit, he was also reminded of the visit time and date. he said "Jacob [name]" and hung up i spoke w/ Jacob [name] again and she does state that some meters cannot be  downloaded and that the pt should know to bring a log book w/ them to appts, i obtained a logbook from Jacob [name] and called pt back, verified his address and instructed him to keep a log of cbg's and bring it w/ him to appt along w/ his meds. he states he will be going to work and doesn't know if he can manage all of that, he is ask that he at least bring the book and informed that i am putting one in the mail to him. Follow-up by: Jacob Finner RN,  April 30, 2010 12:24 PM  Additional Follow-up for Phone Call Additional follow up Details #3:: Details for Additional Follow-up Action Taken: I will see the patient as scheduled and address the above mentioned issues. Additional Follow-up by: Jacob Quiet MD,  May 01, 2010 9:25 PM

## 2010-11-23 NOTE — Cardiovascular Report (Signed)
Summary: [facility] Status Report   [facility] Status Report   Imported By: Sallee Provencal 07/06/2010 15:24:33  _____________________________________________________________________  External Attachment:    Type:   Image     Comment:   External Document

## 2010-11-23 NOTE — Cardiovascular Report (Signed)
Summary: [facility] Status Report   [facility] Status Report   Imported By: Sallee Provencal 05/07/2010 12:46:33  _____________________________________________________________________  External Attachment:    Type:   Image     Comment:   External Document

## 2010-11-23 NOTE — Letter (Signed)
Summary: [facility]   Imported By: Marilynne Drivers 09/22/2010 14:21:05  _____________________________________________________________________  External Attachment:    Type:   Image     Comment:   External Document

## 2010-11-23 NOTE — Discharge Summary (Signed)
Summary: Hospital Discharge Update ([facility])     Hospital Discharge Update:  Date of Admission: 03/19/2010 Date of Discharge: 03/22/2010  Brief Summary:  [facility] mild pneumonia, stable at d/c. Added lasix as regualar home medicine. Avelox course at home.  Lab or other results pending at discharge:  Cd4 Blood Cx  Labs needed at follow-up: Basic metabolic panel  Other follow-up issues:  Resolution on pneumonia.  Volume status K+ and Cr. recently started on lasix. D/c on KCL supplementation  Medication list changes:  Added new medication of FUROSEMIDE 40 MG TABS (FUROSEMIDE) Take 1 tablet by mouth once a day - Signed Added new medication of AVELOX 400 MG TABS (MOXIFLOXACIN HCL) Take 1 tablet by mouth once a day for 5 days. - Signed Added new medication of POTASSIUM CHLORIDE 20 MEQ PACK (POTASSIUM CHLORIDE) Take 1 tablet by mouth once a day - Signed Rx of FUROSEMIDE 40 MG TABS (FUROSEMIDE) Take 1 tablet by mouth once a day;  #30 x 3;  Signed;  Entered by: Lester Astor MD;  Authorized by: Lester Bronte MD;  Method used: Faxed to [facility], [address] [place], [place], [place]  [address], Ph: [phone], Fax: [phone] Rx of AVELOX 400 MG TABS (MOXIFLOXACIN HCL) Take 1 tablet by mouth once a day for 5 days.;  #5 x 0;  Signed;  Entered by: Lester Chemung MD;  Authorized by: Lester Somerton MD;  Method used: Faxed to [facility], [address] [place], [place], [place]  [address], Ph: [phone], Fax: [phone] Rx of POTASSIUM CHLORIDE 20 MEQ PACK (POTASSIUM CHLORIDE) Take 1 tablet by mouth once a day;  #30 x 3;  Signed;  Entered by: Lester Crofton MD;  Authorized by: Lester [place] MD;  Method used: Faxed to [facility], [address] [place], [place], [place]  [address], Ph: [phone], Fax: [phone]  The medication, problem, and allergy lists have been updated.  Please see the dictated discharge summary for  details.  Discharge medications:  COMBIVIR 150-300 MG TABS (LAMIVUDINE-ZIDOVUDINE) Take 1 tablet by mouth two times a day REYATAZ 200 MG CAPS (ATAZANAVIR SULFATE) Take 2 tablets by mouth once a day in the morning. ALLOPURINOL 300 MG TABS (ALLOPURINOL) Take 1 tablet by mouth once a day ASPIRIN 325 MG TABS (ASPIRIN) Take 1 tablet by mouth once a day VASOTEC 10 MG TABS (ENALAPRIL MALEATE) take 1 pill by mouth two times a day. COREG 25 MG  TABS (CARVEDILOL) Take 1 tablet by mouth two times a day GLUCOPHAGE 500 MG  TABS (METFORMIN HCL) Take 1 tablet by mouth two times a day GLIPIZIDE 5 MG  TABS (GLIPIZIDE) Take 1 tablet by mouth once a day LIPITOR 40 MG TABS (ATORVASTATIN CALCIUM) Take 1 tablet by mouth once a day ALLEGRA 180 MG TABS (FEXOFENADINE HCL) Take 1 tablet by mouth once a day NITROQUICK 0.3 MG  SUBL (NITROGLYCERIN) as needed NAFTIN-MP 1 %  CREA (NAFTIFINE HCL) apply once daily as needed FUROSEMIDE 40 MG TABS (FUROSEMIDE) Take 1 tablet by mouth once a day AVELOX 400 MG TABS (MOXIFLOXACIN HCL) Take 1 tablet by mouth once a day for 5 days. POTASSIUM CHLORIDE 20 MEQ PACK (POTASSIUM CHLORIDE) Take 1 tablet by mouth once a day  Other patient instructions:  Yoyr appoiments are unchanged. See your cardiolgoist and Dr. [name] as previously scheduled.  Your prescriptions have been sent to [facility]   Note: Hospital Discharge Medications & Other Instructions handout was  printed, one copy for patient and a second copy to be placed in hospital chart.  Prescriptions: POTASSIUM CHLORIDE 20 MEQ PACK (POTASSIUM CHLORIDE) Take 1 tablet by mouth once a day  #30 x 3   Entered and Authorized by:   Lester [place] MD   Signed by:   Lester Sweeny MD on 03/22/2010   Method used:   Faxed to ...       [facility] (retail)       [address]       [place], [place]  [address]       Ph: [phone]       Fax: [phone]   RxID:   [id number] AVELOX 400 MG  TABS (MOXIFLOXACIN HCL) Take 1 tablet by mouth once a day for 5 days.  #5 x 0   Entered and Authorized by:   Lester [place] MD   Signed by:   Lester [place] MD on 03/22/2010   Method used:   Faxed to ...       [facility] (retail)       [address]       [place], [place]  [address]       Ph: [phone]       Fax: [phone]   RxID:   [id number] FUROSEMIDE 40 MG TABS (FUROSEMIDE) Take 1 tablet by mouth once a day  #30 x 3   Entered and Authorized by:   Lester [place] MD   Signed by:   Lester  MD on 03/22/2010   Method used:   Faxed to ...       [facility] (retail)       [address]       [place], [place]  [address]       Ph: [phone]       Fax: [phone]   RxID:   [id number]

## 2010-11-23 NOTE — Progress Notes (Signed)
Summary: phone/gg  Phone Note Call from Patient   Caller: Patient Summary of Call: Pt called service excellence asking why an eye exam and colonoscopy were ordered.  He has an eye doctor and has had a recent colonscopy within 5 years. Pt cancelled both these appointments.  He is very upset that both these exams were ordered without talking to him, the patient.  He feels it was unprofessional.   Pt # [phone] Initial call taken by: Gevena Cotton RN,  June 07, 2010 10:04 AM  Follow-up for Phone Call        Phone Call Completed. Spoke with the patient. Patient states that he did not know the reason why he was referred to an ophthalmologist and for a colonosocpy. Explained that given his DM and HTN and his last eye exam being in 01/2009, the a standard of care is to have annual fundoscopic exams; also reminded of our conversation during his last OV  when he  did not recall when he had his last colonoscopy; and the fact that no GI report was found in EMR --patient was also referred for a colonoscopy. I oppologized to the patient for poor communication and welcomed any questions or concerns that the patient had or may have in the future. Patient thanked me and verbalized understanding of the matter. Follow-up by: Milana Obey MD,  June 07, 2010 11:36 AM

## 2010-11-23 NOTE — Assessment & Plan Note (Signed)
Summary: RA/NEEDS 2 MONTH F/U VISIT/CH   Vital Signs:  Patient profile:   60 year old male Height:      74 inches (187.96 cm) Weight:      188.8 pounds (85.82 kg) BMI:     24.33 Temp:     97.0 degrees F (36.11 degrees C) oral Pulse rate:   71 / minute BP sitting:   128 / 80  (right arm) Cuff size:   regular  Vitals Entered By: Mateo Flow Deborra Medina) (September 20, 2010 2:36 PM) Is Patient Diabetic? Yes Nutritional Status BMI of 19 -24 = normal CBG Result 86  Have you ever been in a relationship where you felt threatened, hurt or afraid?No   Does patient need assistance? Functional Status Self care Ambulation Normal   Primary Care [facility]:  [name] MD   History of Present Illness: Jacob [name] is here today for a follow up appointment.  He complains of new onset abdominal pain, left lower quadrant and radiating to his left groin, exacerbated by urinating, 5-6/10, comes and goes, stays for 30-40 minutes and goes away by its own. No blood noted while urination,no change in colour of urine. No change in bowel habits. No h/o kidney stones.  He has a list of questions discussed below.  His HTN is at goal.  His DM is at goal with hba1c 5.2. Will d/c glipizide today as he is having lows.  HIV is well controlled. Due for CD4 count and viralload.  Zoster vaccine is not recommneded in HIV patients with immunosuppression, his cd4 count is 760, will discuss it with Dr [name].  Depression: asks me about whether he is depressed and I do not think that he meets the criteria.  Patient was counseled on smoking cessation strategies including medications and behavior modification options.    Preventive Screening-Counseling & Management  Alcohol-Tobacco     Smoking Cessation Counseling: yes  Problems Prior to Update: 1)  Renal Insufficiency, Acute  (ICD-585.9) 2)  Coronary Atherosclerosis Native Coronary Artery  (ICD-414.01) 3)  Chronic Systolic Heart Failure   (ICD-428.22) 4)  Acute Maxillary Sinusitis  (ICD-461.0) 5)  Foot Pain, Right  (ICD-729.5) 6)  Back Pain, Right  (ICD-724.5) 7)  Fracture, Femur, Intertrochanteric Region, Left  (ICD-820.21) 8)  Tobacco Abuse  (ICD-305.1) 9)  Allergic Rhinitis  (ICD-477.9) 10)  Gout  (ICD-274.9) 11)  Hyperlipidemia  (ICD-272.4) 12)  Diabetic Peripheral Neuropathy  (ICD-250.60) 13)  Atherosclerosis, Coronary, Artery Bypass Grft  (ICD-414.04) 14)  Hypertension  (ICD-401.9) 15)  Syphilis  (ICD-097.9) 16)  HIV Disease  (ICD-042) 17)  Diabetes Mellitus, Type II  (ICD-250.00)  Medications Prior to Update: 1)  Combivir 150-300 Mg Tabs (Lamivudine-Zidovudine) .... Take 1 Tablet By Mouth Two Times A Day 2)  Reyataz 200 Mg Caps (Atazanavir Sulfate) .... Take 2 Tablets By Mouth Once A Day in The Morning. 3)  Allopurinol 300 Mg Tabs (Allopurinol) .... Take 1 Tablet By Mouth Once A Day 4)  Aspirin 325 Mg Tabs (Aspirin) .... Take 1 Tablet By Mouth Once A Day 5)  Coreg 25 Mg  Tabs (Carvedilol) .... Take 1 Tablet By Mouth Two Times A Day 6)  Glucophage 500 Mg  Tabs (Metformin Hcl) .... Take 1 Tablet By Mouth Two Times A Day 7)  Glipizide 5 Mg  Tabs (Glipizide) .... Take 1 Tablet By Mouth Once A Day 8)  Lipitor 40 Mg Tabs (Atorvastatin Calcium) .... Take 1 Tablet By Mouth Once A Day 9)  Nitroquick 0.3 Mg  Subl (Nitroglycerin) .... As Needed 10)  Naftin-Mp 1 %  Crea (Naftifine Hcl) .... Apply Once Daily As Needed  Current Medications (verified): 1)  Combivir 150-300 Mg Tabs (Lamivudine-Zidovudine) .... Take 1 Tablet By Mouth Two Times A Day 2)  Reyataz 200 Mg Caps (Atazanavir Sulfate) .... Take 2 Tablets By Mouth Once A Day in The Morning. 3)  Allopurinol 300 Mg Tabs (Allopurinol) .... Take 1 Tablet By Mouth Once A Day 4)  Aspirin 325 Mg Tabs (Aspirin) .... Take 1 Tablet By Mouth Once A Day 5)  Coreg 25 Mg  Tabs (Carvedilol) .... Take 1 Tablet By Mouth Two Times A Day 6)  Glucophage 500 Mg  Tabs (Metformin Hcl) ....  Take 1 Tablet By Mouth Two Times A Day 7)  Lipitor 40 Mg Tabs (Atorvastatin Calcium) .... Take 1 Tablet By Mouth Once A Day 8)  Nitroquick 0.3 Mg  Subl (Nitroglycerin) .... As Needed 9)  Naftin-Mp 1 %  Crea (Naftifine Hcl) .... Apply Once Daily As Needed 10)  Enalapril Maleate 10 Mg Tabs (Enalapril Maleate) .... Take 1 Tablet By Mouth Once A Day  Allergies (verified): No Known Drug Allergies  Directives: 1)  Full Code   Past History:  Past Medical History: Last updated: 2009/01/18 syphilis Coronary artery disease s/p surgery Diabetes mellitus, type II HIV disease Allergic rhinitis Gout Hyperlipidemia Hypertension Ischemic cardiomyopathy.  EF [id number] Chronic systolic heart failure Peripheral neuropathy History of drug abuse.  He has tested for cocaine as recently as February of 2008.  He says he is not using drugs now.  We avoided a defibrillator for this reason. History of Hepatitis C  Past Surgical History: Last updated: 01/18/09 Coronary artery bypass graft Left intertrochanteric hip fracture, status post intermedullary nail placement February 2008 History of left transverse mandibular fracture, October 2005. History of laparoscopic cholecystectomy, August 2007.  Family History: Last updated: 01-18-2009 Father: died of heart failure Brother: DIed of MI, also did cocaine  Social History: Last updated: 05/31/2010 Retired Current Smoker Drug use-no none since 2008 Alcohol Use - yes, 4 beeers daily  Risk Factors: Alcohol Use: 0 (05/31/2010) Caffeine Use: 0 (05/31/2010) Exercise: yes (05/31/2010)  Risk Factors: Smoking Status: current (05/31/2010) Packs/Day: <0.25 (05/31/2010) Cans of tobacco/wk: no (05/31/2010) Passive Smoke Exposure: yes (05/31/2010)  Family History: Reviewed history from 2009/01/18 and no changes required. Father: died of heart failure Brother: DIed of MI, also did cocaine  Social History: Reviewed history from 05/31/2010 and no  changes required. Retired Current Smoker Drug use-no none since 2008 Alcohol Use - yes, 4 beeers daily  Review of Systems      See HPI  Physical Exam  Additional Exam:  Gen: AOx3, in no acute distress Eyes: PERRL, EOMI ENT:MMM, No erythema noted in posterior pharynx Neck: No JVD, No LAP Chest: CTAB with  good respiratory effort CVS: regular rhythmic rate, NO M/R/G, S1 S2 normal Abdo: soft,ND, BS+x4, Non tender and No hepatosplenomegaly EXT: No odema noted Neuro: Non focal, gait is normal Skin: no rashes noted.    Impression & Recommendations:  Problem # 1:  ABDOMINAL PAIN, LEFT LOWER QUADRANT (ICD-789.04) Assessment New Patinet has had abdominalpainsince last few weeks now. The pain is concerning for left renalcolicespecially in the setting of gout. Diverticulosis is also a possibility but patinet deneis any new change in bowel habits or constipation. Patient says that he had colonoscopy 6 years ago. I will ask Ulis Rias forlook for the records as it was done at [facility].  Orders: T-Urinalysis [id number])  Problem # 2:  RENAL INSUFFICIENCY, ACUTE (ICD-585.9) Assessment: Comment Only Will check Cmet today. Labs Reviewed: BUN: 22 (05/31/2010)   Cr: 1.16 (05/31/2010)    Hgb: 15.8 (04/01/2010)   Hct: 47.0 (04/01/2010)   Ca++: 8.9 (05/31/2010)    TP: 8.2 (04/01/2010)   Alb: 3.8 (04/01/2010) HBSAg: NO (12/18/2006)   HBSAb: NO (12/18/2006)  Problem # 3:  CHRONIC SYSTOLIC HEART FAILURE (123456) Assessment: Unchanged Continue current meds. May go up ACEi as tolerated by kidneys for maximal effect.  His updated medication list for this problem includes:    Aspirin 325 Mg Tabs (Aspirin) .[name]... Take 1 tablet by mouth once a day    Coreg 25 Mg Tabs (Carvedilol) .[name]... Take 1 tablet by mouth two times a day    Enalapril Maleate 10 Mg Tabs (Enalapril maleate) .[name]... Take 1 tablet by mouth once a day  Problem # 4:  TOBACCO ABUSE (ICD-305.1) Assessment: Improved Patient was  counseled on smoking cessation strategies including medications and behavior modification options.   Problem # 5:  GOUT (ICD-274.9) Assessment: Improved At goal with serumuric acid levels within normallimits on allopurinol.  His updated medication list for this problem includes:    Allopurinol 300 Mg Tabs (Allopurinol) .[name]... Take 1 tablet by mouth once a day  Elevate extremity; warm compresses, symptomatic relief and medication as directed.   Problem # 6:  HIV DISEASE (ICD-042) Assessment: Unchanged As per Dr [name]. Orders: T-HIV Viral Load [id number]) T-CD4SP [name]) (CD4SP) T-Comprehensive Metabolic Panel (A999333) T-Lipid Profile [id number]) T-RPR (Syphilis) [id number])  Problem # 7:  DIABETES MELLITUS, TYPE II (ICD-250.00) Assessment: Improved Patient having low CBG's. I will stop glipizide as his Hba1c is 5.2. Continue metformin.  The following medications were removed from the medication list:    Glipizide 5 Mg Tabs (Glipizide) .[name]... Take 1 tablet by mouth once a day His updated medication list for this problem includes:    Aspirin 325 Mg Tabs (Aspirin) .[name]... Take 1 tablet by mouth once a day    Glucophage 500 Mg Tabs (Metformin hcl) .[name]... Take 1 tablet by mouth two times a day    Enalapril Maleate 10 Mg Tabs (Enalapril maleate) .[name]... Take 1 tablet by mouth once a day  Orders: T- Capillary Blood Glucose [id number]) T-Hgb A1C (in-house) [id number])  Labs Reviewed: Creat: 1.16 (05/31/2010)     Last Eye Exam: No diabetic retinopathy.  ou  (02/13/2009) Reviewed HgBA1c results: 5.2 (09/20/2010)  6.0 (04/01/2010)  Problem # 8:  Preventive Health Care (ICD-V70.0) Assessment: Comment Only Asks me about zoster vaccine. Therotically he does not have AIDS defining illness and zoster vaccine should not be a problem. Will discuss it with Dr [name] at his next visit.  Complete Medication List: 1)  Combivir 150-300 Mg Tabs (Lamivudine-zidovudine) .... Take 1 tablet  by mouth two times a day 2)  Reyataz 200 Mg Caps (Atazanavir sulfate) .... Take 2 tablets by mouth once a day in the morning. 3)  Allopurinol 300 Mg Tabs (Allopurinol) .... Take 1 tablet by mouth once a day 4)  Aspirin 325 Mg Tabs (Aspirin) .... Take 1 tablet by mouth once a day 5)  Coreg 25 Mg Tabs (Carvedilol) .... Take 1 tablet by mouth two times a day 6)  Glucophage 500 Mg Tabs (Metformin hcl) .... Take 1 tablet by mouth two times a day 7)  Lipitor 40 Mg Tabs (Atorvastatin calcium) .... Take 1 tablet by mouth once a day 8)  Nitroquick 0.3 Mg Subl (Nitroglycerin) .... As needed 9)  Naftin-mp 1 % Crea (Naftifine hcl) .... Apply once daily as needed 10)  Enalapril Maleate 10 Mg Tabs (Enalapril maleate) .... Take 1 tablet by mouth once a day  Patient Instructions: 1)  Tobacco is very bad for your health and your loved ones! You Should stop smoking!. 2)  Stop Smoking Tips: Choose a Quit date. Cut down before the Quit date. decide what you will do as a substitute when you feel the urge to smoke(gum,toothpick,exercise). 3)  It is important that you exercise regularly at least 20 minutes 5 times a week. If you develop chest pain, have severe difficulty breathing, or feel very tired , stop exercising immediately and seek medical attention. 4)  Please schedule a follow-up appointment in 6 months. 5)  Limit your Sodium (Salt). Prescriptions: ENALAPRIL MALEATE 10 MG TABS (ENALAPRIL MALEATE) Take 1 tablet by mouth once a day  #30 x 11   Entered and Authorized by:   Janell Quiet MD   Signed by:   Janell Quiet MD on 09/20/2010   Method used:   Print then Give to Patient   RxID:   [id number]    Orders Added: 1)  T- Capillary Blood Glucose [82948] 2)  T-Hgb A1C (in-house) [[id number]] 3)  Est. Patient Level IV [id number] 4)  T-HIV Viral Load [id number] 5)  T-Urinalysis [81003-65000] 6)  T-CD4SP ([facility]) [CD4SP] 7)  T-Comprehensive Metabolic Panel [id number] 8)  T-Lipid Profile  [[id number]] 9)  T-RPR (Syphilis) [id number]   Process Orders Check Orders Results:     [facility]: Order checked:     [id number] -- T-Lipid Profile -- ABN required due to diagnosis (CPT: [id number]) Tests Sent for requisitioning (September 20, 2010 4:47 PM):     09/20/2010: [facility] -- T-HIV Viral Load [id number] (signed)     09/20/2010: [facility] -- T-Urinalysis [id number] (signed)     09/20/2010: [facility] -- T-Comprehensive Metabolic Panel [id number] (signed)     09/20/2010: [facility] -- T-Lipid Profile [id number] (signed)     09/20/2010: [facility] -- T-RPR (Syphilis) [phone] (signed)     Prevention & Chronic Care Immunizations   Influenza vaccine: Historical  (08/03/2009)   Influenza vaccine deferral: Deferred  (04/01/2010)   Influenza vaccine due: 06/24/2010    Tetanus booster: Not documented   Td booster deferral: Refused  (09/20/2010)   Tetanus booster due: 05/31/2020    Pneumococcal vaccine: Historical  (08/28/2006)   Pneumococcal vaccine deferral: Deferred  (04/01/2010)   Pneumococcal vaccine due: 08/29/2011    H. zoster vaccine: Not documented   H. zoster vaccine deferral: Deferred  (09/20/2010)  Colorectal Screening   Hemoccult: Not documented   Hemoccult action/deferral: Deferred  (09/20/2010)    Colonoscopy: Not documented   Colonoscopy action/deferral: GI referral  (09/20/2010)  Other Screening   PSA: Not documented   PSA action/deferral: Discussed-PSA declined  (05/31/2010)   PSA due due: 06/01/2011   Smoking status: current  (05/31/2010)   Smoking cessation counseling: yes  (09/20/2010)  Diabetes Mellitus   HgbA1C: 5.2  (09/20/2010)   Hemoglobin A1C due: 12/01/2010    Eye exam: No diabetic retinopathy.  ou   (02/13/2009)   Diabetic eye exam action/deferral: Ophthalmology referral  (05/31/2010)   Eye exam due: 02/13/2010     Foot exam: yes  (04/01/2010)   High risk foot: Not documented   Foot care education: Not documented   Foot exam due: 04/02/2011    Urine microalbumin/creatinine ratio: 364.9  (05/31/2010)   Urine microalbumin/cr  due: 04/03/2011    Diabetes flowsheet reviewed?: Yes   Progress toward A1C goal: At goal  Lipids   Total Cholesterol: 145  (04/01/2010)   LDL: 83  (04/01/2010)   LDL Direct: Not documented   HDL: 35  (04/01/2010)   Triglycerides: 136  (04/01/2010)   Lipid panel due: 04/02/2011    SGOT (AST): 41  (04/01/2010)   SGPT (ALT): 34  (04/01/2010) CMP ordered    Alkaline phosphatase: 93  (04/01/2010)   Total bilirubin: 0.9  (04/01/2010)   Liver panel due: 04/02/2011    Lipid flowsheet reviewed?: Yes   Progress toward LDL goal: At goal  Hypertension   Last Blood Pressure: 128 / 80  (09/20/2010)   Serum creatinine: 1.16  (05/31/2010)   Serum potassium 4.8  (05/31/2010) CMP ordered    Basic metabolic panel due: [id number]    Hypertension flowsheet reviewed?: Yes   Progress toward BP goal: At goal  Self-Management Support :   Personal Goals (by the next clinic visit) :     Personal A1C goal: 6  (04/01/2010)     Personal blood pressure goal: 130/80  (04/01/2010)     Personal LDL goal: 100  (04/01/2010)    Patient will work on the following items until the next clinic visit to reach self-care goals:     Medications and monitoring: take my medicines every day, examine my feet every day  (09/20/2010)     Eating: drink diet soda or water instead of juice or soda, eat foods that are low in salt, eat baked foods instead of fried foods  (09/20/2010)     Activity: take a 30 minute walk every day  (05/31/2010)    Diabetes self-management support: Written self-care plan  (09/20/2010)   Diabetes care plan printed   Last diabetes self-management training by diabetes educator: 04/06/2009   Last medical nutrition therapy: 07/12/2007    Hypertension self-management support: Written  self-care plan  (09/20/2010)   Hypertension self-care plan printed.    Lipid self-management support: Written self-care plan  (09/20/2010)   Lipid self-care plan printed.   Laboratory Results   Blood Tests   Date/Time Received: September 20, 2010 3:05 PM  Date/Time Reported: Lenoria Farrier  September 20, 2010 3:05 PM   HGBA1C: 5.2%   (Normal Range: Non-Diabetic - 3-6%   Control Diabetic - 6-8%) CBG Random:: 86mg /dL      Process Orders Check Orders Results:     [facility]: Order checked:     22930 -- T-Lipid Profile -- ABN required due to diagnosis (CPT: [id number]) Tests Sent for requisitioning (September 20, 2010 4:47 PM):     09/20/2010: [facility] -- T-HIV Viral Load [id number] (signed)     09/20/2010: [facility] -- T-Urinalysis [id number] (signed)     09/20/2010: [facility] -- T-Comprehensive Metabolic Panel [id number] (signed)     09/20/2010: [facility] -- T-Lipid Profile [phone] (signed)     09/20/2010: [facility] -- T-RPR (Syphilis) [phone] (signed)

## 2010-11-23 NOTE — Miscellaneous (Signed)
  Clinical Lists Changes  Observations: Added new observation of YEARAIDSPOS: 2003  (09/21/2010 11:18) Added new observation of HIV STATUS: CDC-defined AIDS  (09/21/2010 11:18)

## 2010-11-23 NOTE — Progress Notes (Signed)
Summary: Soc. Work  Dealer placed by: Soc. Work Architectural technologist of Call: Left message for Mr. Phagan to call Soc. Work.    Follow-up for Phone Call        Mr. Heese called back. Mr. Chynoweth is not ready to quit smoking and come up with a quit date.   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 I told him I would send him quit information and he is welcome to call me when he is ready.  Katy Fitch  June 03, 2010 10:33 AM

## 2010-11-23 NOTE — Initial Assessments (Signed)
Summary: Hospital admission  INTERNAL MEDICINE ADMISSION HISTORY AND PHYSICAL  Attending: Dr. Larey Dresser  First contact: Dr. Kelton Pillar   [phone] Second contact: Dr. Dyann Kief  [phone] [facility], after-hours: [phone], [phone])   PCP: Dr. [name]  CC: Productive cough and SOB  HPI: Patient is a 60 year old male With PMH of CHF, CAD s/p CABG and HIV who came to ED for productive cough and SOB. The history was provided by the patient. He said these symptoms started about 3-4 days ago, seems worsening today. He also has subjective fever with chill. The sputum is mild with yellowish color, no blood. She also has left chest and LUQ abdominal pain when cough. The pain was 7 out of 10 in severity, no radiation. He has no nausea, vomiting, diarrhea or dysuria. He also noticed both foot swelling. Current smoker, denies ETOH or recent drug abuse. He has sick contacts about a week ago.   ALLERGIES: NKDA  PAST MEDICAL HISTORY: syphilis Coronary artery disease s/p CABG surgery in 02/2003 and 11/2004 had cardiac cath with 70% stenosis circumflex artery, ischemic cardiomyopathy with patent graft anatomy  Diabetes mellitus, type II: A1C 5.7 (03/2009) HIV disease: CD4: 750, VL <48 in 09/2009 Allergic rhinitis Gout Hyperlipidemia Hypertension Ischemic cardiomyopathy.   Chronic heart failure: EF 40% in [id number] with diastolic dysfunction (grade 3) Peripheral neuropathy History of drug abuse.  He has tested for cocaine as recently as February of 2008.  He says he is not using drugs now.  We avoided a defibrillator for this reason. History of Hepatitis C   MEDICATIONS: COMBIVIR 150-300 MG TABS (LAMIVUDINE-ZIDOVUDINE) Take 1 tablet by mouth two times a day REYATAZ 200 MG CAPS (ATAZANAVIR SULFATE) Take 2 tablets by mouth once a day in the morning. ALLOPURINOL 300 MG TABS (ALLOPURINOL) Take 1 tablet by mouth once a day ASPIRIN 325 MG TABS (ASPIRIN) Take 1 tablet by mouth once a day VASOTEC 10 MG  TABS (ENALAPRIL MALEATE) take 1 pill by mouth two times a day. COREG 25 MG  TABS (CARVEDILOL) Take 1 tablet by mouth two times a day GLUCOPHAGE 500 MG  TABS (METFORMIN HCL) Take 1 tablet by mouth two times a day GLIPIZIDE 5 MG  TABS (GLIPIZIDE) Take 1 tablet by mouth once a day LIPITOR 40 MG TABS (ATORVASTATIN CALCIUM) Take 1 tablet by mouth once a day ALLEGRA 180 MG TABS (FEXOFENADINE HCL) Take 1 tablet by mouth once a day NITROQUICK 0.3 MG  SUBL (NITROGLYCERIN) as needed NAFTIN-MP 1 %  CREA (NAFTIFINE HCL) apply once daily as needed   SOCIAL HISTORY: Retired Current Smoker, 1PPD for 30 years Drug use-no none since 2008 Alcohol Use - occasional   FAMILY HISTORY Father: died of PNA @age  of 37s, had CHF, DM Mother had Alzheimer's disease and died @ age of 71s Brother: DIed of MI @ age of 55, also did cocaine   ROS:See HPI  VITALS: T: 100.5 BP:170/101 > 145/82 P:104 >85 R: 24 O2SAT: 95% ON:2L  PHYSICAL EXAM: General:  alert, well-developed, well-nourished, well-hydrated, and NAD.   Head:  normocephalic.   Ears:  no external deformities.   Nose:  no external erythema.   Mouth:  pharynx pink and moist.   Neck:  supple, no JVD or thyroid enlargement  Lungs:  Bilateral very mild wheezing, bibasilar crackles worse in left side.   Heart:  normal rate, regular rhythm, no murmur, and no JVD.   Abdomen:  soft, non-tender, normal bowel sounds, and no distention.   Extremities:  Bilateral  leg pitting edema up to knees.  Neurologic:  alert & oriented X3, cranial nerves II-XII intact, strength normal in all extremities, and gait normal.     LABS: CBC+Diff  WBC                                      7.1               4.0-10.5         K/uL  RBC                                      3.80       l      4.22-5.81        MIL/uL  Hemoglobin (HGB)                         14.5              13.0-17.0        g/dL  Hematocrit (HCT)                         42.7              39.0-52.0        %  MCV                                       112.5      h      78.0-100.0       fL  MCHC                                     33.8              30.0-36.0        g/dL  RDW                                      13.6              11.5-15.5        %  Platelet Count (PLT)                     210               150-400          K/uL  Neutrophils, %                           70                43-77            %  Lymphocytes, %                           20                12-46            %  Monocytes, %                             7                 3-12             %  Eosinophils, %                           2                 0-5              %  Basophils, %                             1                 0-1              %  Neutrophils, Absolute                    5.0               1.7-7.7          K/uL  Lymphocytes, Absolute                    1.4               0.7-4.0          K/uL  Monocytes, Absolute                      0.5               0.1-1.0          K/uL  Eosinophils, Absolute                    0.1               0.0-0.7          K/uL  Basophils, Absolute                      0.1               0.0-0.1          K/uL  RBC Morphology                           SEE NOTE.    POLYCHROMASIA PRESENT  CMET  Sodium (NA)                              133        l      135-145          mEq/L  Potassium (K)                            3.7               3.5-5.1          mEq/L  Chloride  101               96-112           mEq/L  CO2                                      29                19-32            mEq/L  Glucose                                  132        h      70-99            mg/dL  BUN                                      14                6-23             mg/dL  Creatinine                               1.09              0.4-1.5          mg/dL  GFR, Est Non African American            >60               >60              mL/min  GFR, Est African American                >60                >60              mL/min    Oversized comment, see footnote  1  Bilirubin, Total                         1.8        h      0.3-1.2          mg/dL  Alkaline Phosphatase                     97                39-117           U/L  SGOT (AST)                               55         h      0-37             U/L  SGPT (ALT)                               45  0-53             U/L  Total  Protein                           7.6               6.0-8.3          g/dL  Albumin-Blood                            3.1        l      3.5-5.2          g/dL  Calcium                                  8.2        l      8.4-10.5         mg/dL   Beta Natriuretic Peptide                 946.0      h      0.0-100.0        pg/mL  Cardiac enzymes:  CKMB, POC                                2.3               1.0-8.0          ng/mL  Troponin I, POC                          <0.05             0.00-0.09        ng/mL  Myoglobin, POC                           182               12-200           ng/mL  CKMB, POC                                2.7               1.0-8.0          ng/mL  Troponin I, POC                          <0.05             0.00-0.09        ng/mL  Myoglobin, POC                           243        H      12-200           ng/mL   CHEST - 2 VIEW    Comparison: 10/30/2007    Findings: Cardiomegaly.  Diffuse increase in peribronchial and   interstitial markings compatible with bronchitic changes.  Diffuse   airspace disease is worrisome for  pulmonary edema or pneumonia.  No   focal consolidation.  Slightly prominent pleural fissures noted on   the lateral view.    IMPRESSION:   Findings most worrisome for CHF with mild airspace and interstitial   edema.  Pneumonia cannot be excluded.  ASSESSMENT AND PLAN:  1. Productive cough with SOB: This is likely due to CHF exacerbation based on his Hx of CHF, CXR, increasead BNP, lung crackles with bilateral leg pitting edema. CAP or flu is also possible  with low fever, productive cough and recent sick contacts. HIV-associated PNA is less likely as his recent CD4 720 and VL <48. Because he has CAD s/p CABG and risk factors of HTN, smoking, HLD, needs to rule out ACS.  Plan:   -Admit to Telemetry   -Diuresis with lasix and strict I&O's, recheck BNP in AM   -Blood Cx and Sputum Cx with urine Strep and legionella Ag test   -Avelox IV for now and can change to by mouth if afebrile and symptoms and CXR improve.   -Cardiac enzymes x3, q8h, EKG in AM; Continue ASA, BB, ACEIs, statin.   -CXR in AM  2. DM: Check A1C and start SSI  3. CAD s/p CABG: Will Cardiac enzymes x3, q8h, EKG in AM; Continue ASA, BB, ACEIs, statin.   4. HTN: Will continue home meds and monitor BP.  5: HIV: will check CD4 and VL; continue home ART meds.  6. HLD: Continue lipitor anbd check FLP.  7. Tobacco abuse and drug abuse: will check UDS and provided smoking cessation with nicotine patch.  8. VTE PROPH: lovenox   ATTENDING: I performed and/or observed a history and physical examination of the patient.  I discussed the case with the residents as noted and reviewed the residents' notes.  I agree with the findings and plan--please refer to the attending physician note for more details.  Signature________________________________  Printed Name_____________________________

## 2010-11-23 NOTE — Miscellaneous (Signed)
  Clinical Lists Changes  Observations: Added new observation of PAYOR: Medicare (10/30/2009 15:56)

## 2010-11-23 NOTE — Progress Notes (Signed)
Summary: Diabetes support/dmr  Phone Note Call from Patient   Summary of Call: [place] cell# [phone].   Returned call- answered question about fitting baked liver (bargain price) into diet seldom/once in a while. Mailed alernative healthy,economical protein source ideas to patient.  Initial call taken by: Barnabas Harries RD,CDE,  June 22, 2010 12:29 PM

## 2010-11-23 NOTE — Assessment & Plan Note (Signed)
Summary: ACUTE/1 WEEK RECHECK/CH   Vital Signs:  Patient profile:   60 year old male Height:      74 inches (187.96 cm) Weight:      186.6 pounds (84.82 kg) BMI:     24.04 Temp:     96.9 degrees F (36.06 degrees C) oral Pulse rate:   64 / minute BP sitting:   106 / 70  (right arm) BP standing:   103 / 67  (right arm)  Vitals Entered By: Hilda Blades Ditzler RN (April 08, 2010 9:12 AM) Is Patient Diabetic? Yes Did you bring your meter with you today? No Pain Assessment Patient in pain? no      Nutritional Status BMI of 19 -24 = normal Nutritional Status Detail appetite good  Have you ever been in a relationship where you felt threatened, hurt or afraid?denies   Does patient need assistance? Functional Status Self care Ambulation Normal Comments Fasting. Discuss kidneys and side effects Buproban - stopped meds. CBG this AM 100.   Primary Care Provider:  Alver Fisher MD   History of Present Illness: Jacob [name] comes today for f/u.   1. HTN: He has stopped taking lasix, enalapril and K. No dizziness. He is not using a lot of salt.   2. CHF: No CP, SOB or leg swelling.   3. HL: He is fasting.   4. Tobacco abuse: He has cut down to half a pack. He tried the bupropion but he had some dry mouth and dizziness and headaches and so he stopped taking it for past 2 days.   Depression History:      The patient denies a depressed mood most of the day and a diminished interest in his usual daily activities.         Preventive Screening-Counseling & Management  Alcohol-Tobacco     Alcohol drinks/day: 2     Alcohol type: beer     Smoking Status: current     Smoking Cessation Counseling: yes     Smoke Cessation Stage: precontemplative     Packs/Day: 0.5     Year Started: 20 years ago     Cans of tobacco/week: no     Passive Smoke Exposure: yes  Caffeine-Diet-Exercise     Caffeine use/day: 0     Does Patient Exercise: yes     Type of exercise: walking     Times/week:  5  Current Medications (verified): 1)  Combivir 150-300 Mg Tabs (Lamivudine-Zidovudine) .... Take 1 Tablet By Mouth Two Times A Day 2)  Reyataz 200 Mg Caps (Atazanavir Sulfate) .... Take 2 Tablets By Mouth Once A Day in The Morning. 3)  Allopurinol 300 Mg Tabs (Allopurinol) .... Take 1 Tablet By Mouth Once A Day 4)  Aspirin 325 Mg Tabs (Aspirin) .... Take 1 Tablet By Mouth Once A Day 5)  Coreg 25 Mg  Tabs (Carvedilol) .... Take 1 Tablet By Mouth Two Times A Day 6)  Glucophage 500 Mg  Tabs (Metformin Hcl) .... Take 1 Tablet By Mouth Two Times A Day 7)  Glipizide 5 Mg  Tabs (Glipizide) .... Take 1 Tablet By Mouth Once A Day 8)  Lipitor 40 Mg Tabs (Atorvastatin Calcium) .... Take 1 Tablet By Mouth Once A Day 9)  Nitroquick 0.3 Mg  Subl (Nitroglycerin) .... As Needed 10)  Naftin-Mp 1 %  Crea (Naftifine Hcl) .... Apply Once Daily As Needed 11)  Buproban 150 Mg Xr12h-Tab (Bupropion Hcl (Smoking Deter)) .... Take 1 Pill By Mouth Daily For  3 Days and Then Two Times A Day.  Allergies: No Known Drug Allergies  Social History: Packs/Day:  0.5  Review of Systems      See HPI  Physical Exam  Mouth:  pharynx pink and moist.   Lungs:  normal breath sounds, no crackles, and no wheezes.   Heart:  normal rate, regular rhythm, and no murmur.   Extremities:  trace left pedal edema and 1+ right pedal edema.   Neurologic:  alert & oriented X3.     Impression & Recommendations:  Problem # 1:  CHRONIC SYSTOLIC HEART FAILURE (123456) Pt euvolumic. BP improved than last visit. Will continue to stop ACEi and lasix for now. Will check BMET. If cr is back to normal, will try the lowest dose enalapril only first and follow his kidney fn. If he tolerates well and is on about enalapril 10 mg and especially if he has some signs of fluid retention, will start tiny dose lasix. Pt understands the plan and appreciates the plan.   The following medications were removed from the medication list:    Vasotec 10 Mg  Tabs (Enalapril maleate) .[name]... Take 1 pill by mouth two times a day.    Furosemide 40 Mg Tabs (Furosemide) .[name]... Take 1 tablet by mouth once a day His updated medication list for this problem includes:    Aspirin 325 Mg Tabs (Aspirin) .[name]... Take 1 tablet by mouth once a day    Coreg 25 Mg Tabs (Carvedilol) .[name]... Take 1 tablet by mouth two times a day  Problem # 2:  TOBACCO ABUSE (ICD-305.1) Encouraged back to quit smoking and he states he will try it again. His updated medication list for this problem includes:    Buproban 150 Mg Xr12h-tab (Bupropion hcl (smoking deter)) .[name]... Take 1 pill by mouth daily for 3 days and then two times a day.  Problem # 3:  HYPERLIPIDEMIA (P102836.4) Will check FLP today.   His updated medication list for this problem includes:    Lipitor 40 Mg Tabs (Atorvastatin calcium) .[name]... Take 1 tablet by mouth once a day  Orders: T-Basic Metabolic Panel (99991111) T-Lipid Profile [id number])  Problem # 4:  HYPERTENSION (ICD-401.9) Please see CHF.  The following medications were removed from the medication list:    Vasotec 10 Mg Tabs (Enalapril maleate) .[name]... Take 1 pill by mouth two times a day.    Furosemide 40 Mg Tabs (Furosemide) .[name]... Take 1 tablet by mouth once a day His updated medication list for this problem includes:    Coreg 25 Mg Tabs (Carvedilol) .[name]... Take 1 tablet by mouth two times a day  Orders: T-Basic Metabolic Panel (99991111) T-Lipid Profile [id number])  BP today: 106/70 Prior BP: 89/59 (04/01/2010)  Labs Reviewed: K+: 5.8 (04/01/2010) Creat: : 2.15 (04/01/2010)   Chol: 145 (04/01/2010)   HDL: 35 (04/01/2010)   LDL: 83 (04/01/2010)   TG: 136 (04/01/2010)  Complete Medication List: 1)  Combivir 150-300 Mg Tabs (Lamivudine-zidovudine) .... Take 1 tablet by mouth two times a day 2)  Reyataz 200 Mg Caps (Atazanavir sulfate) .... Take 2 tablets by mouth once a day in the morning. 3)  Allopurinol 300 Mg Tabs (Allopurinol) ....  Take 1 tablet by mouth once a day 4)  Aspirin 325 Mg Tabs (Aspirin) .... Take 1 tablet by mouth once a day 5)  Coreg 25 Mg Tabs (Carvedilol) .... Take 1 tablet by mouth two times a day 6)  Glucophage 500 Mg Tabs (Metformin hcl) .... Take 1  tablet by mouth two times a day 7)  Glipizide 5 Mg Tabs (Glipizide) .... Take 1 tablet by mouth once a day 8)  Lipitor 40 Mg Tabs (Atorvastatin calcium) .... Take 1 tablet by mouth once a day 9)  Nitroquick 0.3 Mg Subl (Nitroglycerin) .... As needed 10)  Naftin-mp 1 % Crea (Naftifine hcl) .... Apply once daily as needed 11)  Buproban 150 Mg Xr12h-tab (Bupropion hcl (smoking deter)) .... Take 1 pill by mouth daily for 3 days and then two times a day.  Patient Instructions: 1)  Limit your Sodium (Salt) to less than 2 grams a day(slightly less than 1/2 a teaspoon) to prevent fluid retention, swelling, or worsening of symptoms. 2)  Tobacco is very bad for your health and your loved ones! You Should stop smoking!. 3)  Stop Smoking Tips: Choose a Quit date. Cut down before the Quit date. decide what you will do as a substitute when you feel the urge to smoke(gum,toothpick,exercise). 4)  It is important that you exercise regularly at least 20 minutes 5 times a week. If you develop chest pain, have severe difficulty breathing, or feel very tired , stop exercising immediately and seek medical attention. Process Orders Check Orders Results:     [facility]: Check successful Tests Sent for requisitioning (April 08, 2010 9:49 AM):     04/08/2010: [facility] -- T-Basic Metabolic Panel 0000000 (signed)     04/08/2010: [facility] -- T-Lipid Profile [id number] (signed)    Prevention & Chronic Care Immunizations   Influenza vaccine: Historical  (08/03/2009)   Influenza vaccine deferral: Deferred  (04/01/2010)    Tetanus booster: Not documented   Td booster deferral: Deferred  (04/01/2010)    Pneumococcal  vaccine: Historical  (08/28/2006)   Pneumococcal vaccine deferral: Deferred  (04/01/2010)    H. zoster vaccine: Not documented   H. zoster vaccine deferral: Deferred  (04/01/2010)  Colorectal Screening   Hemoccult: Not documented    Colonoscopy: Not documented   Colonoscopy action/deferral: Deferred  (04/01/2010)  Other Screening   PSA: Not documented   PSA action/deferral: Discussion deferred  (04/01/2010)   Smoking status: current  (04/08/2010)   Smoking cessation counseling: yes  (04/08/2010)  Diabetes Mellitus   HgbA1C: 6.0  (04/01/2010)   Hemoglobin A1C due: 12/05/2006    Eye exam: No diabetic retinopathy.  ou   (02/13/2009)   Eye exam due: 02/2010    Foot exam: yes  (04/01/2010)   High risk foot: Not documented   Foot care education: Not documented    Urine microalbumin/creatinine ratio: 69.2  (04/02/2010)   Urine microalbumin/cr due: 11/22/2007  Lipids   Total Cholesterol: 145  (04/01/2010)   LDL: 83  (04/01/2010)   LDL Direct: Not documented   HDL: 35  (04/01/2010)   Triglycerides: 136  (04/01/2010)   Lipid panel due: 01/04/2008    SGOT (AST): 41  (04/01/2010)   SGPT (ALT): 34  (04/01/2010)   Alkaline phosphatase: 93  (04/01/2010)   Total bilirubin: 0.9  (04/01/2010)  Hypertension   Last Blood Pressure: 106 / 70  (04/08/2010)   Serum creatinine: 2.15  (04/01/2010)   Serum potassium 5.8  (04/01/2010)  Self-Management Support :   Personal Goals (by the next clinic visit) :     Personal A1C goal: 6  (04/01/2010)     Personal blood pressure goal: 130/80  (04/01/2010)     Personal LDL goal: 100  (04/01/2010)    Diabetes self-management support: Written self-care plan, Education handout,  Resources for patients handout  (04/01/2010)   Last diabetes self-management training by diabetes educator: 04/06/2009   Last medical nutrition therapy: 07/12/2007    Hypertension self-management support: Written self-care plan, Education handout, Resources for  patients handout  (04/01/2010)    Lipid self-management support: Written self-care plan, Education handout, Resources for patients handout  (04/01/2010)   Process Orders Check Orders Results:     [facility]: Check successful Tests Sent for requisitioning (April 08, 2010 9:49 AM):     04/08/2010: [facility] -- T-Basic Metabolic Panel 0000000 (signed)     04/08/2010: [facility] -- T-Lipid Profile [phone] (signed)

## 2010-11-23 NOTE — Progress Notes (Signed)
Summary: Refill/gh  Phone Note Refill Request Message from:  Fax from Pharmacy on Mar 23, 2010 4:08 PM  Refills Requested: Medication #1:  GLUCOPHAGE 500 MG  TABS Take 1 tablet by mouth two times a day   Last Refilled: 02/18/2010  Method Requested: Fax to [facility] Initial call taken by: Sander Nephew RN,  Mar 23, 2010 4:08 PM  Follow-up for Phone Call       Follow-up by: Flo Shanks MD,  Mar 23, 2010 4:11 PM    Prescriptions: GLUCOPHAGE 500 MG  TABS (METFORMIN HCL) Take 1 tablet by mouth two times a day  #60 x 0   Entered and Authorized by:   Flo Shanks MD   Signed by:   Flo Shanks MD on 03/23/2010   Method used:   Faxed to ...       [facility] (retail)       [address]       [place], [place]  [address]       Ph: [phone]       Fax: [phone]   RxID:   [id number]

## 2010-11-23 NOTE — Progress Notes (Signed)
   Phone Note Other Incoming   Caller: PT. Reason for Call: Discuss lab or test results Summary of Call: PT CALLED  RE MEDS STOPPED BY DR POKHAREL PER PT LASIX AND VASOTEC WERE STOPPED LAST WEEK LABS ON 03/26/10 BUN 32 CR 1.6 AND ON 04/01/10  BUN 48 CR 2.15 ALSO NOTED K WAS 5.8 ON 04/01/10 INSTRUCTED TO HOLD K SUPPLE THIS DAY 04/06/10. PER PT HAS APPT ON 04/08/10  WITH DR POKHAREL  MAY NEED REPEAT LABS THEN INSTRUCTED PT TO CALL IF NO LABS WERE DONE PT VERBALIZED UNDERSTANDING. Initial call taken by: Devra Dopp, LPN,  June 14, [id number] AM  Follow-up for Phone Call        OK. Tell him to cut salt intake down.  Follow-up by: Renella Cunas, MD, [facility],  April 06, 2010 12:48 PM     Appended Document:  LMTCB./CY  Appended Document:  PT AWARE./CY

## 2010-11-23 NOTE — Assessment & Plan Note (Signed)
Summary: 50MONTH F/U/VS   Primary Provider:  Alver Fisher MD  CC:  FOllow-up visit and hospitalized the end of May with CHF.  History of Present Illness: Jacob [name] was recently in the hospital with an exacerbation of his congestive heart failure.  He attributes that to continuing to drink beer, which he says he is now stopped, pork skins and salt in his diet, and ongoing cigarette use.  He says he has stopped drinking beer since discharge and has tried to cut out salt in his diet.  He says he is now trying to quit smoking cigarettes cold [place].  He has not missed a single dose of his HIV medications.  He says he has not been sexually active in years.  Preventive Screening-Counseling & Management  Alcohol-Tobacco     Alcohol drinks/day: 0     Alcohol type: stopped on Saturday     Smoking Status: current     Smoking Cessation Counseling: yes     Smoke Cessation Stage: contemplative     Packs/Day: <0.25  Caffeine-Diet-Exercise     Caffeine use/day: 0     Does Patient Exercise: yes     Type of exercise: walking     Exercise (avg: min/session): <30     Times/week: 5  Hep-HIV-STD-Contraception     HIV Risk: no  Safety-Violence-Falls     Seat Belt Use: 100  Comments: declined condoms   Updated Prior Medication List: COMBIVIR 150-300 MG TABS (LAMIVUDINE-ZIDOVUDINE) Take 1 tablet by mouth two times a day REYATAZ 200 MG CAPS (ATAZANAVIR SULFATE) Take 2 tablets by mouth once a day in the morning. ALLOPURINOL 300 MG TABS (ALLOPURINOL) Take 1 tablet by mouth once a day ASPIRIN 325 MG TABS (ASPIRIN) Take 1 tablet by mouth once a day COREG 25 MG  TABS (CARVEDILOL) Take 1 tablet by mouth two times a day GLUCOPHAGE 500 MG  TABS (METFORMIN HCL) Take 1 tablet by mouth two times a day GLIPIZIDE 5 MG  TABS (GLIPIZIDE) Take 1 tablet by mouth once a day LIPITOR 40 MG TABS (ATORVASTATIN CALCIUM) Take 1 tablet by mouth once a day NITROQUICK 0.3 MG  SUBL (NITROGLYCERIN) as needed NAFTIN-MP 1 %  CREA  (NAFTIFINE HCL) apply once daily as needed  Current Allergies (reviewed today): No known allergies  Vital Signs:  Patient profile:   60 year old male Height:      74 inches (187.96 cm) Weight:      166.0 pounds (75.45 kg) BMI:     21.39 Temp:     97.9 degrees F (36.61 degrees C) oral Pulse rate:   67 / minute BP sitting:   118 / 72  (left arm) Cuff size:   large  Vitals Entered By: Lorne Skeens RN (April 20, 2010 9:57 AM) CC: FOllow-up visit, hospitalized the end of May with CHF Is Patient Diabetic? Yes Did you bring your meter with you today? 122 Pain Assessment Patient in pain? no      Nutritional Status aBMI of 19 -24 = normal Nutritional Status Detail appetite "good" on low sodium diet now  Have you ever been in a relationship where you felt threatened, hurt or afraid?No   Does patient need assistance? Functional Status Self care Ambulation Normal Comments no missed doses of HIV meds   Physical Exam  General:  alert and well-nourished.   Mouth:  no exudates and pharyngeal erythema.   Lungs:  normal breath sounds, no crackles, and no wheezes.   Heart:  normal rate, regular rhythm,  and no murmur.   Skin:  no rashes.   Axillary Nodes:  no R axillary adenopathy and no L axillary adenopathy.   Psych:  normally interactive, good eye contact, not anxious appearing, and not depressed appearing.          Medication Adherence: 04/20/2010   Adherence to medications reviewed with patient. Counseling to provide adequate adherence provided   Prevention For Positives: 04/20/2010   Safe sex practices discussed with patient. Condoms offered.                              Impression & Recommendations:  Problem # 1:  HIV DISEASE (ICD-042) Jacob [name]'s HIV infection remains under excellent control.  I will continue his current medications Diagnostics Reviewed:  CD4: [id number] (04/02/2010)   WBC: 4.8 (04/01/2010)   Hgb: 15.8 (04/01/2010)   HCT: 47.0 (04/01/2010)   Platelets: 218  (04/01/2010) HIV-1 RNA: <48 copies/mL (04/01/2010)   HBSAg: NO (12/18/2006)  Problem # 2:  SYPHILIS (ICD-097.9) Over the past several years he's had intermittent low level reactivation of his RPR.  He has been retreated before was recently in 2009.  I do not think that his RPR of one to two represents a new infection and I will not retreat him at this time. Orders: Est. Patient Level IV [id number])  Problem # 3:  RENAL INSUFFICIENCY, ACUTE (ICD-585.9) He had acute renal insufficiency related to his heart failure and its treatment.  His creatinine is improving and I will not change the dosing of his HIV medicines at this time. Orders: Est. Patient Level IV [id number])  Problem # 4:  TOBACCO ABUSE (ICD-305.1) I gave him a card for the [place] 1-800 quit line and encouraged him to call to get advice on helping him quit smoking cigarettes. The following medications were removed from the medication list:    Buproban 150 Mg Xr12h-tab (Bupropion hcl (smoking deter)) .[name]... Take 1 pill by mouth daily for 3 days and then two times a day.  Orders: Est. Patient Level IV [id number])  Other Orders: Future Orders: T-CD4SP ([facility]) (CD4SP) ... 10/17/2010 T-HIV Viral Load [phone]) ... 10/17/2010 T-Comprehensive Metabolic Panel ([id number]) ... 10/17/2010 T-RPR (Syphilis) [id number]) ... 10/17/2010 T-Lipid Profile [phone]) ... 10/17/2010  Patient Instructions: 1)  Please schedule a follow-up appointment in 6 months.

## 2010-11-23 NOTE — Progress Notes (Signed)
Summary: REfill/gh  Phone Note Refill Request Message from:  Fax from Pharmacy on December 25, 2009 12:26 PM  Refills Requested: Medication #1:  VASOTEC 10 MG TABS take 1 pill by mouth two times a day.   Last Refilled: 11/23/2009  Method Requested: Fax to [facility] Initial call taken by: Sander Nephew RN,  December 25, 2009 12:28 PM  Follow-up for Phone Call       Follow-up by: Adrian Prows MD,  December 25, 2009 12:34 PM    Prescriptions: VASOTEC 10 MG TABS (ENALAPRIL MALEATE) take 1 pill by mouth two times a day.  #60 x 3   Entered and Authorized by:   Adrian Prows MD   Signed by:   Adrian Prows MD on 12/25/2009   Method used:   Faxed to ...       [facility] (retail)       [address]       [place], [place]  [address]       Ph: [phone]       Fax: [phone]   RxID:   [id number]

## 2010-11-23 NOTE — Medication Information (Signed)
Summary: PHYYSICIAN ORDER REQUEST  PHYYSICIAN ORDER REQUEST   Imported By: Garlan Fillers 05/03/2010 14:17:10  _____________________________________________________________________  External Attachment:    Type:   Image     Comment:   External Document

## 2010-11-23 NOTE — Progress Notes (Signed)
Summary: Diabetes & Nutrtion support/dmr  Phone Note Call from Patient Call back at Home Phone [phone]   Caller: Patient Summary of Call: Patient called to find out if [place] burgers with 17 grams saturated fat and 14% sodium were healthy. We discussed that this is more satirated fat than he should be getting in a day. He decided they were not a healthy choice. Encouragement and support provided Initial call taken by: Barnabas Harries RD,CDE,  May 03, 2010 3:26 PM

## 2010-11-23 NOTE — Assessment & Plan Note (Signed)
Summary: diabetes support/dmr  Discussed/provided  low sodium recipes with patient, how to read a food label for sodium, suggested target of 500 mg/meal sodium. He reports thst he is using Mrs. Dash, eating at home, reading labels.

## 2010-11-23 NOTE — Progress Notes (Signed)
Summary: Refill/gh  Phone Note Refill Request Message from:  Fax from Pharmacy on May 24, 2010 12:11 PM  Refills Requested: Medication #1:  GLUCOPHAGE 500 MG  TABS Take 1 tablet by mouth two times a day  Method Requested: Electronic Initial call taken by: Sander Nephew RN,  May 24, 2010 12:11 PM  Follow-up for Phone Call        Rx faxed to pharmacy.    CKD but Cr < 1.8.  Will c/w glucophage unless Cr > 1.8. Follow-up by: Alphia Moh MD,  May 24, 2010 3:02 PM    Prescriptions: GLUCOPHAGE 500 MG  TABS (METFORMIN HCL) Take 1 tablet by mouth two times a day  #60 x 1   Entered and Authorized by:   Alphia Moh MD   Signed by:   Alphia Moh MD on 05/24/2010   Method used:   Faxed to ...       [facility] (retail)       [address]       [place], [place]  [address]       Ph: [phone]       Fax: [phone]   RxID:   [id number]

## 2010-11-25 NOTE — Assessment & Plan Note (Signed)
Summary: f23m/dfg  Medications Added ENALAPRIL MALEATE 5 MG TABS (ENALAPRIL MALEATE) 1 tab two times a day      Allergies Added: NKDA  Visit Type:  6 mo f/u Primary Provider:  [name] MD  CC:  sob.....gout in right elbow today....denies any other complaints today.  History of Present Illness: [name] returns today for evaluation and management his coronary artery disease. He is having no angina or ischemic symptoms. He denies orthopnea, PND or edema. His  last cholesterol was 141, nitroglycerin 441, LDL could not be calculated, and HDL was 39  Current Medications (verified): 1)  Combivir 150-300 Mg Tabs (Lamivudine-Zidovudine) .... Take 1 Tablet By Mouth Two Times A Day 2)  Reyataz 200 Mg Caps (Atazanavir Sulfate) .... Take 2 Tablets By Mouth Once A Day in The Morning. 3)  Allopurinol 300 Mg Tabs (Allopurinol) .... Take 1 Tablet By Mouth Once A Day 4)  Aspirin 325 Mg Tabs (Aspirin) .... Take 1 Tablet By Mouth Once A Day 5)  Coreg 25 Mg  Tabs (Carvedilol) .... Take 1 Tablet By Mouth Two Times A Day 6)  Glucophage 500 Mg  Tabs (Metformin Hcl) .... Take 1 Tablet By Mouth Two Times A Day 7)  Lipitor 40 Mg Tabs (Atorvastatin Calcium) .... Take 1 Tablet By Mouth Once A Day 8)  Enalapril Maleate 5 Mg Tabs (Enalapril Maleate) .[name].. 1 Tab Two Times A Day 9)  Nitroquick 0.3 Mg  Subl (Nitroglycerin) .... As Needed 10)  Naftin-Mp 1 %  Crea (Naftifine Hcl) .... Apply Once Daily As Needed  Allergies (verified): No Known Drug Allergies  Past History:  Past Medical History: Last updated: 01/19/09 syphilis Coronary artery disease s/p surgery Diabetes mellitus, type II HIV disease Allergic rhinitis Gout Hyperlipidemia Hypertension Ischemic cardiomyopathy.  EF [id number] Chronic systolic heart failure Peripheral neuropathy History of drug abuse.  He has tested for cocaine as recently as February of 2008.  He says he is not using drugs now.  We avoided a defibrillator for this  reason. History of Hepatitis C  Past Surgical History: Last updated: January 19, 2009 Coronary artery bypass graft Left intertrochanteric hip fracture, status post intermedullary nail placement February 2008 History of left transverse mandibular fracture, October 2005. History of laparoscopic cholecystectomy, August 2007.  Family History: Last updated: 2009/01/19 Father: died of heart failure Brother: DIed of MI, also did cocaine  Social History: Last updated: 05/31/2010 Retired Current Smoker Drug use-no none since 2008 Alcohol Use - yes, 4 beeers daily  Risk Factors: Alcohol Use: 0 (10/05/2010) Caffeine Use: 0 (10/05/2010) Exercise: yes (10/05/2010)  Risk Factors: Smoking Status: current (10/05/2010) Packs/Day: <0.25 (05/31/2010) Pipe Use/wk: 0.5 pack of small cigars (10/05/2010) Cans of tobacco/wk: no (10/05/2010) Passive Smoke Exposure: yes (10/05/2010)  Review of Systems       negative other than history of present illness  Vital Signs:  Patient profile:   60 year old male Height:      74 inches Weight:      184.75 pounds BMI:     23.81 Pulse rate:   72 / minute Pulse rhythm:   irregular BP sitting:   130 / 70  (left arm) Cuff size:   large  Vitals Entered By: Julaine Hua, CMA (November 02, 2010 9:07 AM)  Physical Exam  General:  Well developed, well nourished, in no acute distress. Head:  normocephalic and atraumatic Eyes:  PERRLA/EOM intact; conjunctiva and lids normal. Neck:  Neck supple, no JVD. No masses, thyromegaly or abnormal cervical nodes. Chest [place]:  no deformities or breast masses noted Lungs:  Clear bilaterally to auscultation and percussion. Heart:  PMI nondisplaced, regular rate and rhythm, normal S1-S2 no carotid bruits Msk:  Back normal, normal gait. Muscle strength and tone normal. Pulses:  pulses normal in all 4 extremities Extremities:  No clubbing or cyanosis. Neurologic:  Alert and oriented x 3. Psych:  Normal  affect.   Impression & Recommendations:  Problem # 1:  CORONARY ATHEROSCLEROSIS NATIVE CORONARY ARTERY (ICD-414.01) Assessment Unchanged  His updated medication list for this problem includes:    Aspirin 325 Mg Tabs (Aspirin) .[name]... Take 1 tablet by mouth once a day    Coreg 25 Mg Tabs (Carvedilol) .[name]... Take 1 tablet by mouth two times a day    Enalapril Maleate 5 Mg Tabs (Enalapril maleate) .[name]... 1 tab two times a day    Nitroquick 0.3 Mg Subl (Nitroglycerin) .[name]... As needed  Problem # 2:  TOBACCO ABUSE (ICD-305.1) Assessment: Unchanged advised to quit  Problem # 3:  HYPERTENSION (ICD-401.9) Assessment: Improved  His updated medication list for this problem includes:    Aspirin 325 Mg Tabs (Aspirin) .[name]... Take 1 tablet by mouth once a day    Coreg 25 Mg Tabs (Carvedilol) .[name]... Take 1 tablet by mouth two times a day    Enalapril Maleate 5 Mg Tabs (Enalapril maleate) .[name]... 1 tab two times a day  Problem # 4:  CHRONIC SYSTOLIC HEART FAILURE (123456) Assessment: Unchanged  His updated medication list for this problem includes:    Aspirin 325 Mg Tabs (Aspirin) .[name]... Take 1 tablet by mouth once a day    Coreg 25 Mg Tabs (Carvedilol) .[name]... Take 1 tablet by mouth two times a day    Enalapril Maleate 5 Mg Tabs (Enalapril maleate) .[name]... 1 tab two times a day    Nitroquick 0.3 Mg Subl (Nitroglycerin) .[name]... As needed  Patient Instructions: 1)  Your physician recommends that you schedule a follow-up appointment in: 6 months with Dr. Verl Blalock 2)  Your physician recommends that you continue on your current medications as directed. Please refer to the Current Medication list given to you today.

## 2010-11-25 NOTE — Cardiovascular Report (Signed)
Summary: [facility] Status Report   [facility] Status Report   Imported By: Sallee Provencal 10/12/2010 14:04:27  _____________________________________________________________________  External Attachment:    Type:   Image     Comment:   External Document

## 2010-11-25 NOTE — Letter (Signed)
Summary: DIABETIC SUPPLIES-[facility] CORRECTED FORM  DIABETIC SUPPLIES-DIABETES CARE [facility] CORRECTED FORM   Imported By: Enedina Finner 10/07/2010 10:29:55  _____________________________________________________________________  External Attachment:    Type:   Image     Comment:   External Document

## 2010-11-25 NOTE — Cardiovascular Report (Signed)
Summary: [facility] Status Report   [facility] Status Report   Imported By: Sallee Provencal 10/12/2010 14:03:39  _____________________________________________________________________  External Attachment:    Type:   Image     Comment:   External Document

## 2010-11-25 NOTE — Assessment & Plan Note (Signed)
Summary: 6MONTHF/U [MKJ]   Primary Provider:  [name] MD  CC:  follow-up visit, intense sore throat x 2 days, even swallowing water hurts, and some sneezing this AM.  History of Present Illness: Jacob [name] is in for his routine visit.  He developed a mild sore throat two days ago but is feeling well otherwise and has had no other difficulties since his last visit.  He has not had any fever, new or changing cough, or sinus congestion.  He denies missing a single dose of his Combivir or Reyataz.  He got his flu shot at the drugstore in October.  Preventive Screening-Counseling & Management  Alcohol-Tobacco     Alcohol drinks/day: 0     Alcohol type: stopped on Saturday     Smoking Status: current     Smoking Cessation Counseling: yes     Smoke Cessation Stage: contemplative     Year Started: 20 years ago     Pipe use/week: 0.5 pack of small cigars     Cans of tobacco/week: no     Passive Smoke Exposure: yes  Caffeine-Diet-Exercise     Caffeine use/day: 0     Does Patient Exercise: yes     Type of exercise: walking     Exercise (avg: min/session): <30     Times/week: 5  Hep-HIV-STD-Contraception     HIV Risk: no risk noted     HIV Risk Counseling: not indicated-no HIV risk noted  Safety-Violence-Falls     Seat Belt Use: yes  Comments: declined condoms      Drug Use:  never.     Prior Medication List:  COMBIVIR 150-300 MG TABS (LAMIVUDINE-ZIDOVUDINE) Take 1 tablet by mouth two times a day REYATAZ 200 MG CAPS (ATAZANAVIR SULFATE) Take 2 tablets by mouth once a day in the morning. ALLOPURINOL 300 MG TABS (ALLOPURINOL) Take 1 tablet by mouth once a day ASPIRIN 325 MG TABS (ASPIRIN) Take 1 tablet by mouth once a day COREG 25 MG  TABS (CARVEDILOL) Take 1 tablet by mouth two times a day GLUCOPHAGE 500 MG  TABS (METFORMIN HCL) Take 1 tablet by mouth two times a day LIPITOR 40 MG TABS (ATORVASTATIN CALCIUM) Take 1 tablet by mouth once a day ENALAPRIL MALEATE 10 MG TABS (ENALAPRIL  MALEATE) Take 1 tablet by mouth once a day NITROQUICK 0.3 MG  SUBL (NITROGLYCERIN) as needed NAFTIN-MP 1 %  CREA (NAFTIFINE HCL) apply once daily as needed   Current Allergies (reviewed today): No known allergies  Vital Signs:  Patient profile:   60 year old male Height:      74 inches (187.96 cm) Weight:      187.25 pounds (85.11 kg) BMI:     24.13 Temp:     97.5 degrees F (36.39 degrees C) oral Pulse rate:   69 / minute BP sitting:   132 / 75  (left arm) Cuff size:   large  Vitals Entered By: Lorne Skeens RN (October 05, 2010 9:50 AM) CC: follow-up visit, intense sore throat x 2 days, even swallowing water hurts, some sneezing this AM Is Patient Diabetic? Yes Did you bring your meter with you today? 150 this AM Pain Assessment Patient in pain? yes     Location: throat Intensity: 5 Type: soreness Nutritional Status BMI of 19 -24 = normal Nutritional Status Detail appetite "great"  Have you ever been in a relationship where you felt threatened, hurt or afraid?No   Does patient need assistance? Functional Status Self care Ambulation Normal Comments  no missed doses of HIV rxes   Physical Exam  General:  alert and well-nourished.   Mouth:  no exudates but mild  pharyngeal erythema.   Lungs:  normal breath sounds, no crackles, and no wheezes.   Heart:  normal rate, regular rhythm, and no murmur.          Medication Adherence: 10/05/2010   Adherence to medications reviewed with patient. Counseling to provide adequate adherence provided                                Impression & Recommendations:  Problem # 1:  HIV DISEASE (ICD-042) His adherence remains excellent and his infection is still under very good control.  I will continue his current regimen. Diagnostics Reviewed:  HIV: CDC-defined AIDS (09/21/2010)   CD4: 900 (09/21/2010)   WBC: 4.8 (04/01/2010)   Hgb: 15.8 (04/01/2010)   HCT: 47.0 (04/01/2010)   Platelets: 218 (04/01/2010) HIV-1 RNA: <20  copies/mL (09/20/2010)   HBSAg: NO (12/18/2006)  Other Orders: Est. Patient Level III [id number]) Future Orders: T-CD4SP ([facility]) (CD4SP) ... 04/03/2011 T-HIV Viral Load [phone]) ... 04/03/2011 T-Comprehensive Metabolic Panel ([id number]) ... 04/03/2011 T-CBC w/Diff [id number]) ... 04/03/2011 T-RPR (Syphilis) [id number]) ... 04/03/2011 T-Lipid Profile [id number]) ... 04/03/2011  Patient Instructions: 1)  Please schedule a follow-up appointment in 6 months.    Influenza Immunization History:    Influenza # 1:  Historical (07/26/2010)

## 2010-11-25 NOTE — Letter (Signed)
Summary: [place] By: Marilynne Drivers 10/06/2010 13:50:21  _____________________________________________________________________  External Attachment:    Type:   Image     Comment:   External Document

## 2010-11-25 NOTE — Cardiovascular Report (Signed)
Summary: [facility] Status Report   [facility] Status Report   Imported By: Sallee Provencal 10/12/2010 14:05:05  _____________________________________________________________________  External Attachment:    Type:   Image     Comment:   External Document

## 2010-11-25 NOTE — Cardiovascular Report (Signed)
Summary: [facility] Status Report   [facility] Status Report   Imported By: Sallee Provencal 10/12/2010 14:04:02  _____________________________________________________________________  External Attachment:    Type:   Image     Comment:   External Document

## 2010-11-25 NOTE — Letter (Signed)
Summary: [place] By: Marilynne Drivers 10/06/2010 13:52:43  _____________________________________________________________________  External Attachment:    Type:   Image     Comment:   External Document

## 2011-01-04 ENCOUNTER — Other Ambulatory Visit: Payer: Self-pay | Admitting: *Deleted

## 2011-01-04 LAB — GLUCOSE, CAPILLARY: Glucose-Capillary: 86 mg/dL (ref 70–99)

## 2011-01-04 LAB — T-HELPER CELL (CD4) - (RCID CLINIC ONLY): CD4 % Helper T Cell: 35 % (ref 33–55)

## 2011-01-04 MED ORDER — ENALAPRIL MALEATE 5 MG PO TABS: 5.0000 mg | ORAL_TABLET | Freq: Two times a day (BID) | ORAL | Status: DC

## 2011-01-07 LAB — GLUCOSE, CAPILLARY: Glucose-Capillary: 146 mg/dL — ABNORMAL HIGH (ref 70–99)

## 2011-01-10 LAB — COMPREHENSIVE METABOLIC PANEL
Alkaline Phosphatase: 97 U/L (ref 39–117)
BUN: 14 mg/dL (ref 6–23)
Chloride: 101 mEq/L (ref 96–112)
Creatinine, Ser: 1.09 mg/dL (ref 0.4–1.5)
Glucose, Bld: 132 mg/dL — ABNORMAL HIGH (ref 70–99)
Potassium: 3.7 mEq/L (ref 3.5–5.1)
Total Bilirubin: 1.8 mg/dL — ABNORMAL HIGH (ref 0.3–1.2)

## 2011-01-10 LAB — BASIC METABOLIC PANEL
BUN: 13 mg/dL (ref 6–23)
BUN: 14 mg/dL (ref 6–23)
CO2: 29 mEq/L (ref 19–32)
CO2: 32 mEq/L (ref 19–32)
Calcium: 8.6 mg/dL (ref 8.4–10.5)
Chloride: 101 mEq/L (ref 96–112)
Creatinine, Ser: 1.12 mg/dL (ref 0.4–1.5)
Creatinine, Ser: 1.15 mg/dL (ref 0.4–1.5)
GFR calc Af Amer: >60 mL/min (ref >60)
GFR calc non Af Amer: >60 mL/min (ref >60)
GFR calc non Af Amer: >60 mL/min (ref >60)
GFR calc non Af Amer: >60 mL/min (ref >60)
Glucose, Bld: 79 mg/dL (ref 70–99)
Glucose, Bld: 96 mg/dL (ref 70–99)
Potassium: 3.4 mEq/L — ABNORMAL LOW (ref 3.5–5.1)
Potassium: 4.2 mEq/L (ref 3.5–5.1)
Sodium: 137 mEq/L (ref 135–145)
Sodium: 140 mEq/L (ref 135–145)

## 2011-01-10 LAB — DIFFERENTIAL
Basophils Absolute: 0.1 10*3/uL (ref 0.0–0.1)
Basophils Absolute: 0.1 10*3/uL (ref 0.0–0.1)
Basophils Relative: 1 % (ref 0–1)
Eosinophils Absolute: 0.1 10*3/uL (ref 0.0–0.7)
Eosinophils Absolute: 0.1 10*3/uL (ref 0.0–0.7)
Lymphocytes Relative: 20 % (ref 12–46)
Monocytes Relative: 7 % (ref 3–12)
Neutro Abs: 2.7 10*3/uL (ref 1.7–7.7)
Neutrophils Relative %: 70 % (ref 43–77)

## 2011-01-10 LAB — CBC
HCT: 38.3 % — ABNORMAL LOW (ref 39.0–52.0)
HCT: 42.7 % (ref 39.0–52.0)
Hemoglobin: 13 g/dL (ref 13.0–17.0)
Hemoglobin: 14.5 g/dL (ref 13.0–17.0)
Hemoglobin: 15.2 g/dL (ref 13.0–17.0)
MCV: 111.8 fL — ABNORMAL HIGH (ref 78.0–100.0)
MCV: 112.5 fL — ABNORMAL HIGH (ref 78.0–100.0)
Platelets: 180 10*3/uL (ref 150–400)
RBC: 3.4 MIL/uL — ABNORMAL LOW (ref 4.22–5.81)
RBC: 3.97 MIL/uL — ABNORMAL LOW (ref 4.22–5.81)
RDW: 13.6 % (ref 11.5–15.5)
RDW: 13.7 % (ref 11.5–15.5)
RDW: 13.9 % (ref 11.5–15.5)
WBC: 4 10*3/uL (ref 4.0–10.5)
WBC: 4.4 10*3/uL (ref 4.0–10.5)
WBC: 7.1 10*3/uL (ref 4.0–10.5)

## 2011-01-10 LAB — POCT CARDIAC MARKERS
CKMB, poc: 2.6 ng/mL (ref 1.0–8.0)
Myoglobin, poc: 182 ng/mL (ref 12–200)
Myoglobin, poc: 234 ng/mL (ref 12–200)
Troponin i, poc: <0.05 ng/mL (ref 0.00–0.09)
Troponin i, poc: <0.05 ng/mL (ref 0.00–0.09)

## 2011-01-10 LAB — URINALYSIS, ROUTINE W REFLEX MICROSCOPIC
Bilirubin Urine: NEGATIVE
Ketones, ur: NEGATIVE mg/dL
Nitrite: NEGATIVE
Protein, ur: NEGATIVE mg/dL
Urobilinogen, UA: 1 mg/dL (ref 0.0–1.0)

## 2011-01-10 LAB — CULTURE, RESPIRATORY W GRAM STAIN: Culture: NORMAL

## 2011-01-10 LAB — MAGNESIUM: Magnesium: 2 mg/dL (ref 1.5–2.5)

## 2011-01-10 LAB — T-HELPER CELLS (CD4) COUNT (NOT AT ARMC)
CD4 % Helper T Cell: 32 % — ABNORMAL LOW (ref 33–55)
CD4 T Cell Abs: 460 uL (ref 400–2700)

## 2011-01-10 LAB — RAPID URINE DRUG SCREEN, HOSP PERFORMED
Amphetamines: NOT DETECTED
Barbiturates: NOT DETECTED
Cocaine: NOT DETECTED
Opiates: NOT DETECTED
Tetrahydrocannabinol: NOT DETECTED

## 2011-01-10 LAB — GLUCOSE, CAPILLARY
Glucose-Capillary: 101 mg/dL — ABNORMAL HIGH (ref 70–99)
Glucose-Capillary: 108 mg/dL — ABNORMAL HIGH (ref 70–99)
Glucose-Capillary: 127 mg/dL — ABNORMAL HIGH (ref 70–99)
Glucose-Capillary: 128 mg/dL — ABNORMAL HIGH (ref 70–99)
Glucose-Capillary: 129 mg/dL — ABNORMAL HIGH (ref 70–99)
Glucose-Capillary: 131 mg/dL — ABNORMAL HIGH (ref 70–99)
Glucose-Capillary: 95 mg/dL (ref 70–99)

## 2011-01-10 LAB — EXPECTORATED SPUTUM ASSESSMENT W GRAM STAIN, RFLX TO RESP C

## 2011-01-10 LAB — CULTURE, BLOOD (ROUTINE X 2): Culture: NO GROWTH

## 2011-01-10 LAB — CARDIAC PANEL(CRET KIN+CKTOT+MB+TROPI)
CK, MB: 2.7 ng/mL (ref 0.3–4.0)
Relative Index: 0.9 (ref 0.0–2.5)
Relative Index: 1.1 (ref 0.0–2.5)
Troponin I: 0.02 ng/mL (ref 0.00–0.06)

## 2011-01-10 LAB — CK TOTAL AND CKMB (NOT AT ARMC)
Relative Index: 0.8 (ref 0.0–2.5)
Total CK: 372 U/L — ABNORMAL HIGH (ref 7–232)

## 2011-01-10 LAB — LIPID PANEL
Cholesterol: 129 mg/dL (ref 0–200)
LDL Cholesterol: 82 mg/dL (ref 0–99)

## 2011-01-10 LAB — HEPATITIS B SURFACE ANTIGEN: Hepatitis B Surface Ag: NEGATIVE

## 2011-01-31 LAB — GLUCOSE, CAPILLARY: Glucose-Capillary: 139 mg/dL — ABNORMAL HIGH (ref 70–99)

## 2011-02-17 ENCOUNTER — Telehealth: Payer: Self-pay | Admitting: Physician Assistant

## 2011-02-17 NOTE — Telephone Encounter (Signed)
Returned call from patient concerning need for sleeping aid.  I asked patient to discuss the matter with his primary care provider as I was not comfortable prescribing sleeping pills.  He said he was told to talk to his cardiologist and would like Dr. Yolanda Bonine input.  He requested I leave a message with TW.  I ensured him that I would, he appreciated the call back.

## 2011-02-18 ENCOUNTER — Ambulatory Visit (INDEPENDENT_AMBULATORY_CARE_PROVIDER_SITE_OTHER): Payer: PRIVATE HEALTH INSURANCE | Admitting: Internal Medicine

## 2011-02-18 ENCOUNTER — Telehealth: Payer: Self-pay | Admitting: Cardiology

## 2011-02-18 ENCOUNTER — Encounter: Payer: Self-pay | Admitting: Internal Medicine

## 2011-02-18 VITALS — BP 119/71 | HR 60 | Temp 97.7°F | Ht 74.0 in | Wt 181.4 lb

## 2011-02-18 DIAGNOSIS — I2510 Atherosclerotic heart disease of native coronary artery without angina pectoris: Secondary | ICD-10-CM

## 2011-02-18 DIAGNOSIS — G4700 Insomnia, unspecified: Secondary | ICD-10-CM

## 2011-02-18 DIAGNOSIS — I10 Essential (primary) hypertension: Secondary | ICD-10-CM

## 2011-02-18 DIAGNOSIS — E119 Type 2 diabetes mellitus without complications: Secondary | ICD-10-CM

## 2011-02-18 LAB — GLUCOSE, CAPILLARY: Glucose-Capillary: 107 mg/dL — ABNORMAL HIGH (ref 70–99)

## 2011-02-18 MED ORDER — ENALAPRIL MALEATE 5 MG PO TABS: 5.0000 mg | ORAL_TABLET | Freq: Two times a day (BID) | ORAL | Status: DC

## 2011-02-18 NOTE — Progress Notes (Signed)
  Subjective:    [name], male    DOB: 1951-06-27, 60 y.o.   MRN: [id number]  HPI  Jacob [name] is a 60 year old man Hospital history of HIV, diabetes and coronary artery disease. Patient is fitted for a routine visit.  The patient's ascites with a well-controlled and is taking all his medications as prescribed.  Blood pressure is well-controlled at 119/71. Patient takes enalapril 5 mg twice a day.  Diabetes is very well controlled at CBGs about 75% at target range.   Patient complaints of difficulty falling and staying sleep.  Patient is still smoking about half a pack a day and does not want to quit.    Review of Systems  Constitutional: Negative for fever, activity change and appetite change.  HENT: Negative for sore throat.   Respiratory: Negative for cough and shortness of breath.   Cardiovascular: Negative for chest pain and leg swelling.  Gastrointestinal: Negative for nausea, abdominal pain, diarrhea, constipation and abdominal distention.  Genitourinary: Negative for frequency, hematuria and difficulty urinating.  Neurological: Negative for dizziness and headaches.  Psychiatric/Behavioral: Positive for sleep disturbance. Negative for suicidal ideas and behavioral problems.       Objective:   Physical Exam  Constitutional: He is oriented to person, place, and time. He appears well-developed and well-nourished.  HENT:  Head: Normocephalic and atraumatic.  Eyes: Conjunctivae and EOM are normal. Pupils are equal, round, and reactive to light. No scleral icterus.  Neck: Normal range of motion. Neck supple. No JVD present. No thyromegaly present.  Cardiovascular: Normal rate, regular rhythm, normal heart sounds and intact distal pulses.  Exam reveals no gallop and no friction rub.   No murmur heard. Pulmonary/Chest: Effort normal and breath sounds normal. No respiratory distress. He has no wheezes. He has no rales.  Abdominal: Soft. Bowel sounds are normal.  He exhibits no distension and no mass. There is no tenderness. There is no rebound and no guarding.  Musculoskeletal: Normal range of motion. He exhibits no edema and no tenderness.  Lymphadenopathy:    He has no cervical adenopathy.  Neurological: He is alert and oriented to person, place, and time.  Psychiatric: He has a normal mood and affect. His behavior is normal.          Assessment & Plan:

## 2011-02-18 NOTE — Telephone Encounter (Signed)
Spoke to doctor on call last night.  Complained of trouble sleeping.  Pt was told that Antony Madura would speak to Dr. Verl Blalock today.  If Dr. Verl Blalock calls something in for sleep,please call pres into [facility].  [phone].  Pt not at a number for you to call him back.  Can leave message on home num [phone].

## 2011-02-18 NOTE — Assessment & Plan Note (Signed)
We will go up on metformin as his A1c is more than 7.

## 2011-02-18 NOTE — Progress Notes (Signed)
Addended by: Joellyn Haff on: 02/18/2011 11:52 AM   Modules accepted: Orders

## 2011-02-18 NOTE — Telephone Encounter (Signed)
He needs to get this thru primary care.

## 2011-02-18 NOTE — Assessment & Plan Note (Signed)
Advised him to take 81 mg tablet of aspirin instead of 325 mg

## 2011-02-18 NOTE — Patient Instructions (Signed)
Insomnia Insomnia is frequent trouble falling and/or staying asleep. Insomnia can be a long term problem or a short term problem. Both are common. Insomnia can be a short term problem when the wakefulness is related to a certain stress or worry. Long term insomnia is often related to ongoing stress during waking hours and/or poor sleeping habits. Overtime, sleep deprivation itself can make the problem worse. Every little thing feels more severe because you are overtired and your ability to cope is decreased. SYMPTOMS  Not feeling rested in the morning.   Anxiety and restlessness at bedtime.   Difficulty falling and staying asleep.  CAUSES  Stress, anxiety, and depression.   Poor sleeping habits.   Distractions such as TV in the bedroom.   Naps close to bedtime.   Engaging in emotionally charged conversations before bed.   Technical reading before sleep.   Alcohol and other sedatives. They may make the problem worse. They can hurt normal sleep patterns and normal dream activity.   Stimulants such as caffeine for several hours prior to bedtime.   Pain syndromes and shortness of breath can cause insomnia.   Exercise late at night.   Changing time zones may cause sleeping problems (jet lag).  It is sometimes helpful to have someone observe your sleeping patterns. They should look for periods of not breathing during the night (sleep apnea). They should also look to see how long those periods last. If you live alone or observers are uncertain, you can also be observed at a sleep clinic where your sleep patterns will be professionally monitored. Sleep apnea requires a checkup and treatment. Give your caregivers your medical history. Give your caregivers observations your family has made about your sleep.  TREATMENT  Your caregiver may prescribe treatment for an underlying medical disorders. Your caregiver can give advice or help if you are using alcohol or other drugs for self-medication.  Treatment of underlying problems will usually eliminate insomnia problems.   Medications can be prescribed for short time use. They are generally not recommended for lengthy use.   Over-the-counter sleep medicines are not recommended for lengthy use. They can be habit forming.   You can promote easier sleeping by making lifestyle changes such as:   Using relaxation techniques that help with breathing and reduce muscle tension.   Exercising earlier in the day.   Changing your diet and the time of your last meal. No night time snacks.   Establish a regular time to go to bed.   Counseling can help with stressful problems and worry.   Soothing music and white noise may be helpful if there are background noises you cannot remove.   Stop tedious detailed work at least one hour before bedtime.  HOME CARE INSTRUCTIONS  Keep a diary. Inform your caregiver about your progress. This includes any medication side effects. See your caregiver regularly. Take note of:   Times when you are asleep.  Times when you are awake during the night.   The quality of your sleep.  How you feel the next day.   This information will help your caregiver care for you.  Get out of bed if you are still awake after 15 minutes. Read or do some quiet activity. Keep the lights down. Wait until you feel sleepy and go back to bed.   Keep regular sleeping and waking hours. Avoid naps.   Exercise regularly.   Avoid distractions at bedtime. Distractions include watching television or engaging in any intense or detailed  activity like attempting to balance the household checkbook.   Develop a bedtime ritual. Keep a familiar routine of bathing, brushing your teeth, climbing into bed at the same time each night, listening to soothing music. Routines increase the success of falling to sleep faster.   Use relaxation techniques. This can be using breathing and muscle tension release routines. It can also include  visualizing peaceful scenes. You can also help control troubling or intruding thoughts by keeping your mind occupied with boring or repetitive thoughts like the old concept of counting sheep. You can make it more creative like imagining planting one beautiful flower after another in your backyard garden.   During your day, work to eliminate stress. When this is not possible use some of the previous suggestions to help reduce the anxiety that accompanies stressful situations.  MAKE SURE YOU:   Understand these instructions.   Will watch your condition.   Will get help right away if you are not doing well or get worse.  Document Released: 10/07/2000 Document Re-Released: 09/22/2008 [facility] Patient Information 2011 [place].  Follow up in 6 months. Try and stop smoking, if you need any help contact us.

## 2011-02-18 NOTE — Telephone Encounter (Signed)
Left message on personal machine advised to take Tylenol pm or Benadryl.  If this doesn't work to call pcp Dr. [name]. Horton Chin RN

## 2011-02-18 NOTE — Assessment & Plan Note (Signed)
At goal.  

## 2011-02-21 ENCOUNTER — Ambulatory Visit: Payer: Self-pay | Admitting: Internal Medicine

## 2011-03-08 NOTE — Assessment & Plan Note (Signed)
[place]                            CARDIOLOGY OFFICE NOTE   Jacob [name], Jacob [name]                      MRN:          [id number]  DATE:03/12/2008                            DOB:          Oct 26, 1950    PROBLEM LIST:  1. Ischemic cardiomyopathy.  He has had coronary bypass grafting.  EF      30%.  2. Chronic systolic heart failure.  3. Hypertension.  4. Hyperlipidemia.  5. Type 2 diabetes with poor dietary compliance.  6. Human immunodeficiency virus.  7. History of drug abuse.  He has tested for cocaine as recently as      February of 2008.  He says he is not using drugs now.  We avoided a      defibrillator for this reason.   His meds are unchanged since his last visit except we went up on his  Coreg to 25 b.i.d.   His blood pressure is elevated today.  He says he had a pork chop  sandwich and had meatballs the other night.  He has also had a bag of  chips.   He denies any orthopnea, PND or peripheral edema.  He had no ischemic  symptoms.  He denies any palpitations, presyncope or syncope.   He had a vascular exam because of some leg aching and pain, which was  normal back in January of 2009.   Blood pressure today is 154/88, his pulse is 80 and regular.  His weight  is 198.  HEENT is unchanged.  Carotids are full without bruits, no JVD.  Thyroid is not enlarged.  Trachea is midline.  Lungs were clear to auscultation.  HEART:  Reveals a displaced PMI, has an S4.  There is no gallop  otherwise.  ABDOMEN:  Soft, good bowel sounds.  EXTREMITIES:  No cyanosis, clubbing or edema.  Pulses are 2+/4+ dorsalis  pedis and posterior tibial.  NEURO EXAM:  Intact.   I am concerned about [name]'s blood pressure, particularly his LV  dysfunction.  His dietary compliance sounds horrendous.  I have talked  to him about this today.   I have increased his enalapril to 5 mg p.o. b.i.d.  His renal function  has been normal as checked at the  infectious disease clinic at [facility].  I  will see him back again in 6 months.    Thomas C. Verl Blalock, MD, [facility]  Electronically Signed   TCW/MedQ  DD: 03/12/2008  DT: 03/12/2008  Job #: [id number]

## 2011-03-08 NOTE — Assessment & Plan Note (Signed)
[place] OFFICE NOTE   Jacob [name], Jacob [name]                      MRN:          [id number]  DATE:09/11/2008                            DOB:          18-Mar-1951    Mr. Golin returns today for followup.  He is doing remarkably well with  no symptoms of angina or ischemia or decompensated heart failure.   Problem list given on Mar 12, 2008.  He is due blood work with Dr.  [name] next week.  He is also due blood work with his diabetologist  who is following the lipids as well.   He seems to be fairly compliant, but I do smell tobacco.   His meds are unchanged since his last visit except that we did go up on  his enalapril last time to 5 mg p.o. b.i.d.   PHYSICAL EXAMINATION:  VITAL SIGNS:  His pressure is better today at  124/80, pulse 64 and regular.  His weight is 201.  HEENT:  Sclerae are muddy, otherwise unremarkable.  NECK:  Carotid upstrokes are equal bilaterally without bruits.  No JVD.  Thyroid is not enlarged.  Trachea is midline.  LUNGS:  Clear to auscultation and percussion.  HEART:  A nondisplaced PMI.  Normal S1 and S2.  No gallop.  ABDOMEN:  Soft.  Good bowel sounds.  No midline bruit.  No hepatomegaly.  EXTREMITIES:  There were no cyanosis, clubbing, or edema.  Pulses are  intact.   His electrocardiogram shows sinus rhythm with nonspecific ST-segment  changes.  There is some T-wave inversion in the lateral leads which we  have seen intermittently in the past.   ASSESSMENT AND PLAN:  Mr. Lavelle is doing well.  We have made no changes  in his program.  I will see him back again in 6 months.     Thomas C. Verl Blalock, MD, [facility]  Electronically Signed    TCW/MedQ  DD: 09/11/2008  DT: 09/12/2008  Job #: [id number]

## 2011-03-08 NOTE — Assessment & Plan Note (Signed)
[place] OFFICE NOTE   Jacob [name], Jacob [name]                      MRN:          [id number]  DATE:08/27/2007                            DOB:          1951-08-28    [name] returns today for the following issues:  1. Coronary artery disease, status post coronary artery bypass      grafting.  He is having no symptoms of angina or ischemia.  2. Left ventricular systolic dysfunction with chronic systolic      congestive heart failure.  Echo January 23, 2007, ejection fraction      25% to 30%.  Diffuse left ventricular hypokinesia without definite      segmental wall motion abnormalities.  3. Hypertension.  4. Hyperlipidemia.  5. Type 2 diabetes.  6. HIV.  7. History of drug use, now says without.  He tested positive for      cocaine in February of 2008.   His meds are Combivir 150/300 mg p.o. b.i.d., allopurinol 300 mg a day,  enteric-coated aspirin 325 mg a day, Glucotrol XL 5 mg daily, Reyataz  200 mg 2 daily, Lipitor 40 mg a day, Neurontin 300 mg p.o. t.i.d.,  colchicine 0.6 mg p.r.n., enalapril 2.5 mg a day (has not tolerated 5 mg  a day for blood pressure), Allegra 180 mg a day, Coreg 12.5 mg p.o.  b.i.d., Glucophage 500 b.i.d.   He is not being followed by Dr. [name] with Dr. Quentin Cornwall leading.   He is in no acute distress.  His blood pressure is 112/70, his pulse is 60 is regular, his weight is  200.  HEENT:  Normocephalic, atraumatic.  PERRLA.  Extraocular movements  intact.  Sclerae is slightly injected.  Facial symmetry is normal.  NECK:  Supple.  Carotid upstrokes were equal bilaterally without bruits.  No JVD.  Thyroid is not enlarged, trachea is midline.  LUNGS:  Clear.  HEART:  Reveals a slightly displaced PMI, he has a normal S1, S2 without  S3.  ABDOMINAL EXAM:  Soft with good bowel sounds.  EXTREMITIES:  Reveal no edema.  Pulses are intact.   EKG shows sinus rhythm, left atrial  enlargement, inferior wall infarct,  T-wave abnormality in I and aVL, and V4-V6 which is unchanged.   ASSESSMENT AND PLAN:  [name] is stable.  I have asked him to continue  the current medications.  We will see him back again in 6 months.     Thomas C. Verl Blalock, MD, [facility]  Electronically Signed    TCW/MedQ  DD: 08/27/2007  DT: 08/27/2007  Job #: [id number]   cc:   [name], M.D.
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
## 2011-03-11 NOTE — Op Note (Signed)
NAME:  Jacob [name], Jacob [name] NO.:  [id number]   MEDICAL RECORD NO.:  [id number]                   PATIENT TYPE:  INP   LOCATION:  2001                                 FACILITY:  [facility]   PHYSICIAN:  Lilia Argue. Servando Snare, M.D.            DATE OF BIRTH:  May 09, 1951   DATE OF PROCEDURE:  02/24/2003  DATE OF DISCHARGE:                                 OPERATIVE REPORT   PREOPERATIVE DIAGNOSIS:  Coronary occlusive disease.   POSTOPERATIVE DIAGNOSIS:  Coronary occlusive disease.   OPERATION:  Coronary artery bypass grafting x 2 with reversed saphenous vein  graft to the intermediate coronary artery and reverse saphenous vein graft  to the distal right coronary artery with right leg endovein harvesting.   SURGEON:  Lilia Argue. Servando Snare, M.D.   FIRST ASSISTANT:  Ivin Poot, M.D.   SECOND ASSISTANT:  Mardene Celeste, R.N.   BRIEF HISTORY:  The patient is a 60 year old male who presented to Dr.  Jenell Milliner in January of this year with increasing anginal symptoms.  The  patient underwent cardiac catheterization and had a total occlusion of his  right coronary artery and an approximately 70% stenosis of intermediate  coronary artery which was the dominant supplier of the lateral wall.  He had  a small diagonal with disease.  The LAD was free of disease.  Initially  attempts at treating the patient medically were tried; however, inspite of  medication he continued to have episodes of angina.  Then he was referred  for bypass surgery.  This and options were discussed with the patient and he  was willing to proceed.   DESCRIPTION OF PROCEDURE:  [place] and arterial monitors were placed.  The  patient underwent general endotracheal anesthesia without incident.  The  skin of the chest and legs were prepped with Betadine and draped in the  usual sterile manner.  Using a [place] endovein harvesting system, a segment  of vein was harvested from the right thigh and  was of adequate quality and  caliber.  A median sternotomy was performed.  Pericardium was opened.  Consideration of off pump bypass had been entertained; however, the  intermediate coronary artery was deeply intramyocardial.  The patient was  systemically heparinized.  The ascending aorta and the right atrium were  cannulated and the aortic root bent cardioplegia needle was introduced into  the ascending aorta.  The patient was placed on cardiopulmonary bypass 2.4  liters per minute per meter squared.  The sites of anastomosis were  dissected out of the epicardium.  He had significant distal disease  throughout all the vessels.  The circumflex proper was very small and not  bypassable.  The diagonal was small and not bypassable.  The intermediate  was intramyocardial and was dissected out.  Aortic cross clamp was applied  and 500 cc of cold blood Cardioplegia was administered with diastolic arrest  of the  heart.  Myocardial septal temperature was moderate throughout the  cross clamp period.  Attention was turned first to the distal right coronary  artery which was opened and admitted 1.5 mm probe. Using a running 7-0  Prolene distal anastomosis was performed.  Attention was then turned to the  intermediate coronary artery which in a similar fashion was opened and  distal anastomosis was performed with running 7-0 Prolene.  Aortic cross  clamp was removed with a total cross clamp time of 27 minutes.  The patient  spontaneously converted to a sinus rhythm.  Partial occlusion clamp was  placed on the ascending aorta.  Two punch aortotomies were performed.  Each  of the two vein grafts were anastomosed to the ascending aorta.  Air was  evacuated from the grafts and partial occlusion clamp was removed.  Sites of  anastomosis were inspected and were free of bleeding.  The patient was  ventilated and weaned from cardiopulmonary bypass without difficulty.  He  remained hemodynamically stable.  He  was decannulated in the usual fashion.  Protamine sulfate was administered.  With operative field hemostatic two  atrial and two ventricular pacing wires were applied.  Graft marker was  applied.  Left pleural tube and two mediastinal tubes were left in place.  The sternum was closed with #6 stainless steel wire.  Fascia closed with  interrupted 0 Vicryl, running 3-0 Vicryl in subcutaneous tissue and 4-0  subcuticular.  Dry dressings were applied.  Sponge and needle counts were  reported as correct at the completion of the procedure.  The patient  tolerated the procedure without obvious complication and was transferred to  surgical intensive care unit for further postoperative care.                                               Lilia Argue Servando Snare, M.D.    EBG/MEDQ  D:  02/26/2003  T:  02/27/2003  Job:  [id number]   cc:   [facility]
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
## 2011-03-11 NOTE — Op Note (Signed)
NAMEBEHRUZ, Jacob [name] NO.:  [id number]   MEDICAL RECORD NO.:  [id number]          PATIENT TYPE:  INP   LOCATION:  [place]                         [facility]ACILITY[facility]   PHYSICIAN:  Imogene Burn. Georgette Dover, M.D. DATE OF BIRTH:  Sep 15, 1951   DATE OF PROCEDURE:  06/14/2006  DATE OF DISCHARGE:                                 OPERATIVE REPORT   PREOPERATIVE DIAGNOSIS:  Acute cholecystitis.   POSTOPERATIVE DIAGNOSIS:  1. Acute cholecystitis.  2. Choledocholithiasis.   PROCEDURE PERFORMED:  Laparoscopic cholecystectomy with intraoperative  cholangiogram.   SURGEON:  Imogene Burn. Georgette Dover, M.D.   ASSISTANT:  Dr. Margot Chimes.   ANESTHESIA:  General endotracheal.   INDICATIONS:  The patient is a 60 year old male with a significant past  medical history for HIV, hypertension, coronary artery disease status post  CABG and diabetes.  The patient presents the emergency department with right  upper quadrant pain for 2 days.  He did have some nausea.  He was noted to  have gallstones and a question of common bile duct stones.  There was  evidence of acute cholecystitis.  He was brought to the operating room for  cholecystectomy.  He was given preoperative antibiotics.   DESCRIPTION OF PROCEDURE:  The patient brought to the operating room, placed  supine position operating table.  After an adequate level of general  anesthesia was obtained, the patient's abdomen was prepped with Betadine and  draped in sterile fashion.  The area around his umbilicus as well as in the  subxiphoid position were both infiltrated with 0.25% Marcaine.  Cautery was  used to create a skin incision in transverse fashion below his umbilicus.  The fascia was opened vertically.  A pursestring suture of 0 Vicryl as  placed around the fascial opening at the umbilicus.  The Hasson cannula was  inserted and secured with a stay suture.  The patient was positioned in  reverse Trendelenburg position, rotated slightly to his  left.  Pneumoperitoneum is obtained by insufflating CO2 maintaining maximal  pressure 15 mmHg.  Another transverse incision was made in subxiphoid  position.  The 10 mm port was placed here.  Two 5 mm ports were placed in  the right upper quadrant.  A very large, edematous, inflamed gallbladder was  noted.  The suction aspirator was used to drain and decompressed the  gallbladder.  Gallbladder was grasped with clamp and elevated over the edge  of the liver.  Blunt dissection was used to expose the surface of the  gallbladder free from the surrounding omental adhesions.  We were able  identify the cystic duct.  This was circumferentially dissected, ligated,  clipped distally.  A small opening was created on the cystic duct and a Cook  cholangiogram catheter was inserted and secured with a clip.  Cholangiogram  was obtained which showed multiple filling defects and common bile duct.  Contrast flowed proximally and distally.  There was flow into the duodenum.  We gave 1 gram of glucagon and repeated the cholangiogram after several  minutes.  Contrast continued to flow in the duodenum but the filling defects  persisted.  The cystic duct was then ligated with four clips and divided.  We will obtain an ERCP on postoperative basis.  The cystic artery was then  ligated, clipped and divided.  The gallbladder was then dissected free from  the liver bed using cautery.  The gallbladder was very inflamed, the wall  contained purulent material.  Once the gallbladder was completely removed it  was placed in EndoCatch sac.  We then reinspected the gallbladder fossa.  Cautery was used for hemostasis.  No further bleeding was noted.  We  thoroughly irrigated the right upper quadrant due to the inflammation and  purulent material.  This was all suctioned out.  The gallbladder was then  removed through the umbilical fascial opening.  The pursestring suture was  used to tie down the umbilical fascia.  We  reinspected the right upper  quadrant and suctioned out irrigation.  We also some irrigation from the  pelvis.  Pneumoperitoneum was released as ports were removed.  Skin staples  were used to close skin.  The patient was then extubated and brought to  recovery in stable condition.  All sponge, instrument counts correct.      Imogene Burn. Tsuei, M.D.  Electronically Signed     MKT/MEDQ  D:  06/14/2006  T:  06/15/2006  Job:  [id number]

## 2011-03-11 NOTE — Cardiovascular Report (Signed)
NAME:  Jacob [name], Jacob [name] NO.:  [id number]   MEDICAL RECORD NO.:  [id number]                   PATIENT TYPE:  OIB   LOCATION:  2899                                 FACILITY:  [facility]   PHYSICIAN:  Eustace Quail, M.D. [facility]              DATE OF BIRTH:  1951-07-19   DATE OF PROCEDURE:  11/19/2002  DATE OF DISCHARGE:                              CARDIAC CATHETERIZATION   CLINICAL HISTORY:  The patient is 60 years old and has had symptoms of chest  and left arm pain with exertion for several weeks.  He has positive risk  factors for coronary disease including hypertension, tobacco use and he does  use cocaine; he also is HIV-positive.  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   PROCEDURES PERFORMED:  Left heart catheterization, selective coronary  angiography, subclavian angiography and distal aortography.   DESCRIPTION OF PROCEDURE:  The procedure was performed via the right femoral  artery using arterial sheath and 6-French preformed coronary catheters.  A  femoral arterial puncture was performed and Omnipaque contrast was used.  A  subclavian injection was performed to assess the internal mammary artery to  see if it was suitable for bypass grafting.  A distal aortogram was  performed to rule out renal artery stenosis.  The patient tolerated the  procedure well and left the laboratory in satisfactory condition.   RESULTS:  Left main coronary artery:  The left main coronary artery was free  for significant disease.   Left anterior descending artery:  The left anterior descending artery gave  rise to a diagonal branch, two septal perforators and a second diagonal  branch.  The first diagonal branch was completely occluded at its origin and  there were two large subbranches which filled via collaterals.  The LV was  irregular but there was no other major obstruction.   Left circumflex artery:  The left circumflex artery gave  rise to a  moderately large intermediate branch and then an atrial branch and a small  marginal branch and a small A-V branch.  There was 80% narrowing in the  proximal circumflex artery.  There was 70% segmental disease in the large  intermediate branch.   Right coronary artery:  The right coronary artery was completely occluded  proximally.  The proximal to mid vessel filled via bridging collaterals and  the vessel was again completely occluded.  The distal right coronary artery  filled via collaterals from the circumflex artery and filled a  posterolateral and posterior descending branch.   Subclavian injection:  A subclavian injection showed patent subclavian  artery and patent vertebral and internal mammary arteries.   Left ventriculogram:  The left ventriculogram performed in the RAO  projection showed global hypokinesis with an estimated ejection fraction of  20%.   Distal aortogram:  The distal aortogram was performed which showed patent  renal arteries with two renal arteries on the left and one on the right and  no major aortoiliac obstruction.   The aortic pressure was 126/84 with a mean of 102.  The left ventricular  pressure was 126/15.    CONCLUSION:  1. Severe three-vessel coronary artery disease with total occlusion of the     diagonal branch of the left anterior descending, 70% stenosis in the     intermediate branch of the circumflex artery and 80% stenosis in the     proximal circumflex artery, and total occlusion of the right coronary     artery.  2. Severe global left ventricular dysfunction with an ejection fraction of     20%.   RECOMMENDATIONS:  The patient has three-vessel coronary artery disease which  is not suitable for a percutaneous revascularization.  His left ventricular  dysfunction appears out of proportion to his coronary disease.  I am  undecided whether medical therapy or surgical revascularization is the best  choice for management of his  coronary disease.  His left ventricular  dysfunction will need to be managed with medications and he is at increased  risk for sudden cardiac death and I have asked Dr. Deboraha Sprang to see  him for a consultation about consideration for ICD under the Native 2  criteria.  Dr. Marijo Conception. Wall and I discussed these findings.   ADDENDUM:  He is on an ACE inhibitor, but currently is not on a beta  blocker, although his pulse rates have been slightly low.                                               Eustace Quail, M.D. [facility]    BB/MEDQ  D:  11/19/2002  T:  11/19/2002  Job:  [id number]   cc:   Derinda Late, M.D.  [place] [address]  Fax: [place]. Tomma Lightning, M.D.  [phone] [address] [place]  [place] [address]  Fax: [phone]

## 2011-03-11 NOTE — Discharge Summary (Signed)
NAMEEPPIE, PULE NO.:  [id number]   MEDICAL RECORD NO.:  [id number]          PATIENT TYPE:  INP   LOCATION:  5018                         FACILITY:  [facility]   PHYSICIAN:  Evette Doffing, M.D.  DATE OF BIRTH:  11-May-1951   DATE OF ADMISSION:  12/03/2006  DATE OF DISCHARGE:  12/07/2006                               DISCHARGE SUMMARY   DISCHARGE DIAGNOSES:  1. Left intertrochanteric hip fracture, status post intermedullary      nail placement.  2. Acute bronchitis.  3. Human immunodeficiency virus.  4. Type 2 diabetes.  5. Hypertension.  6. Coronary artery disease, status post prior coronary artery bypass      graft.  7. Hyperlipidemia.  8. Peripheral neuropathy.  9. Gout.  10.Allergic rhinitis.  11.Ongoing cocaine abuse.  12.History of left transverse mandibular fracture, October 2005.  13.History of laparoscopic cholecystectomy, August 2007.   DISCHARGE MEDICATIONS:  1. Allopurinol 300 mg p.o. daily.  2. Aspirin 325 mg p.o. daily.  3. Reyataz 400 mg p.o. q. a.m.  4. Lipitor 40 mg p.o. daily.  5. Colace 100 mg p.o. b.i.d. p.r.n. constipation.  6. Doxycycline 100 mg p.o. b.i.d. x3 days.  7. Enalapril 2.5 mg p.o. b.i.d.  8. Famotidine 40 mg p.o. nightly p.r.n. abdominal pain and      indigestion.  9. Neurontin 300 mg p.o. t.i.d.  10.Glipizide 10 mg p.o. b.i.d.  11.Combivir 1 tab p.o. b.i.d.  12.Metformin 1000 mg p.o. b.i.d.  13.Senokot 1 tablet p.o. b.i.d. p.r.n. constipation.  14.Vicodin 500/5 one tab p.o. q.6 h. p.r.n. pain.  15.Coumadin 1 mg p.o. daily x4 weeks, to be adjusted further by Dr.      Sharol Given.  16.Allegra 180 mg p.o. daily p.r.n. seasonal allergies.  17.Colchicine 0.6 mg p.o. daily p.r.n. acute gout flare.   DISPOSITION AND FOLLOWUP:  At the time of discharge, the patient was in  good condition.  His hip pain was well controlled and he was able to  ambulate using a rolling walker.  He will be scheduled for followup in  the  outpatient clinic in approximately 1-2 weeks.  He will be called  with this appointment date and time.  The patient was also asked to  contact Dr. Jess Barters office to arrange for followup in approximately two  weeks.  Finally the patient will receive outpatient physical therapy for  rehabilitation of his left hip fracture.   PROCEDURES PERFORMED:  1. Three view plain film of the left hip:  This was performed on      December 03, 2006 and revealed an acute left femoral      intratrochanteric fracture.  2. Left hip intramedullary nail with symphysis, 11 x 170 mm nail with      105 mm spiral blade and a 36 mm distal interlock:  This was      performed on December 04, 2006 by Dr. Meridee Score.  The patient      tolerated this procedure without difficulty.   CONSULTATIONS:  Dr. Meridee Score, orthopedics.   BRIEF ADMITTING HISTORY AND PHYSICAL:  Mr. Zemaitis is a 60 year old  gentleman with a history of HIV, diabetes, coronary artery disease, and  hypertension, who was brought to the emergency department following a  fall.  He reports that he was walking down an embankment when he slipped  and landed on his left side.  At that time he noticed severe pain in his  left hip and was unable to stand on his own.  He cried for help and was  found by a neighbor who promptly summoned EMS.  He reports that at no  point before, during or after his fall did he lose consciousness.  He  also did not experience any dizziness or lightheadedness prior to this  event.  Of note Mr. Lotito states that he has been having an intermittent  cough with occasional yellowish-brown sputum production for 2-3 days  prior to his fall but denies having fevers or chills.  However, he notes  a brief, sharp, left-sided chest pain with coughing and deep  inspiration, which has been present for one week prior to presentation.   PHYSICAL EXAMINATION:  VITAL SIGNS:  Temperature 97.2, blood pressure  134/87, pulse 76, respirations 19,  oxygen saturation 99% on room air.  GENERAL:  The patient is a well-developed, well-nourished gentleman  lying comfortably in bed.  HEENT:  Pupils are equal, round and reactive to light and accommodation.  Oropharynx is clear, though the right tonsil appears slightly larger  than the left.  No erythema or exudates are noted.  NECK:  Supple without lymphadenopathy or thyromegaly.  RESPIRATORY:  Patient has good air movement bilaterally.  However, faint  rhonchi are noted bilaterally as well as intermittent expiratory  wheezes.  CARDIOVASCULAR:  The patient has a regular rate and rhythm without  murmurs, rubs or gallops.  No JVD or carotid bruits are noted.  EXTREMITIES:  No lower extremity edema is present.  The patient has  significant pain with any movement of his left hip.  He has  2+ radial, posterior tibial and dorsalis pedis pulses bilaterally.  NEUROLOGIC:  The patient is alert and oriented x3.  Cranial nerves II-  XII are intact.  He has 5/5 strength in his upper extremities and right  lower extremity.  We were unable to assess strength in his left lower  extremity secondary to pain.  Lower extremity sensation is intact.   ADMISSION LABS:  White blood cell count 6.2, hemoglobin 15.4, hematocrit  45, platelets 146, sodium 136, potassium 5, chloride 106, bicarbonate  23, BUN 16, creatinine 0.9, glucose 98, anion gap 7, total bilirubin  1.8, alkaline phosphatase 73, AST 48, ALT 46, total protein 7.1, albumin  3.1, calcium 8.1.  Urine drug screen positive for cocaine and opiates.   Chest x-ray:  No acute cardiopulmonary findings were seen.   EKG:  Patient has a mildly bradycardic rate with a pulse of 58.  However, it is regular.  Inverted T waves are seen in leads V5 and V6 as  well as aVL.  T-wave flattening is also present in lead I.  However, no  significant changes are noted from the prior EKG dated June 14, 2006.  HOSPITAL COURSE BY PROBLEM:  1. Left intertrochanteric hip  fracture:  Based on the history of the      patient's fall and the x-ray findings on admission, he was      diagnosed with a fracture of his left hip.  Orthopedics was      consulted in the emergency room but was not able to evaluate the  patient until the following morning.  At that time, it was felt      that Mr. Aldridge would benefit from fixation of his fracture with an      intramedullary nail.  This procedure was performed by Dr. Sharol Given      without complications on February 11.  The patient's pain improved      significantly and was well controlled with as-needed Vicodin.      Physical therapy was initiated and is to continue following      discharge.  The patient was initially started on DVT prophylaxis      with Lovenox but was then transitioned to Coumadin.  He is to      continue taking 1 mg of Coumadin daily for one month, which was      suggested by Dr. Sharol Given.  Further adjustments of this will be      deferred to Dr. Sharol Given, with whom the patient is to follow up in two      weeks.  2. Acute bronchitis:  On admission the patient complained of an      intermittently productive cough, as well as some pleuritic chest      pain that has been present for several days prior to admission.  He      was not febrile and did not show any other signs or symptoms of a      systemic infection.  His chest x-ray was negative for pneumonia.      Thus, it was felt that he most likely had acute bronchitis.  Though      this was likely viral, he was started on oral doxycycline for seven      days because of his other comorbidities and the duration of his      symptoms.  The patient's cough and intermittent chest pain resolved      on the day following admission and remained absent through      discharge.  3. HIV:  The patient has a history of HIV, which is followed by Dr.      Quentin Cornwall in the infectious disease clinic.  He was continued on his      home regimen of Reyataz and Combivir.  Further  management will be      deferred to Dr. Quentin Cornwall.  4. Hypertension:  Patient has a history of hypertension for which he      has been taking enalapril and Coreg.  However, upon reaching the      floor his blood pressure trended down and was actually borderline      low.  At that time it was felt that he was likely volume depleted      secondary to being n.p.o. from his operation and he was      resuscitated with fluids and his blood pressure medications were      held.  Additionally, because he was found to be cocaine positive at      the time of admission, it was felt that continued beta blocker      therapy was not in the patient's best interest.  Therefore, he was      asked to only continue with enalapril.  Further management will be      deferred to the patient's primary care physician and cardiologist.  5. Diabetes mellitus:  The patient has a history of type 2 diabetes     controlled with oral hypoglycemics.  He was initially transitioned  to sliding scale insulin because of his surgical procedure.      However, thereafter he was restarted on his home regimen of      glipizide and metformin, which he is tolerating without problems.      He is to continue taking this following discharge.  Further changes      will be deferred to the primary care physician.  Of note a      hemoglobin A1c obtained at the time of admission revealed excellent      control, as it was 5.5.   DISCHARGE LABS:  White blood cell count 7.1, hemoglobin 11.4, hematocrit  33.5, platelets 136, PT 18, INR 1.4, sodium 133, potassium 4.2, chloride  105, bicarbonate 24, BUN 17, creatinine 1.1, glucose 135, calcium 8.3,  hemoglobin A1c 5.5.   DISCHARGE VITALS:  Temperature 98.5, blood pressure 103/65, pulse 85,  respirations 17, oxygen saturation 98% on room air.      Nelva Bush, M.D.  Electronically Signed      Evette Doffing, M.D.  Electronically Signed    CE/MEDQ  D:  12/08/2006  T:   12/09/2006  Job:  [id number]   cc:   Newt Minion, MD  Marijo Conception. Verl Blalock, MD, [facility]  Alver Fisher, M.D.  Arelia Longest. [name] Nasuti., M.D.  Evette Doffing, M.D.

## 2011-03-11 NOTE — Consult Note (Signed)
NAME:  Jacob, [name] NO.:  [id number]   MEDICAL RECORD NO.:  [id number]                   PATIENT TYPE:  OIB   LOCATION:  2899                                 FACILITY:  [facility]   PHYSICIAN:  Jacob [name], M.D. [facility]           DATE OF BIRTH:  [birth year]   DATE OF CONSULTATION:  11/19/2002  DATE OF DISCHARGE:                                   CONSULTATION   Thank you very much for asking me to see the patient in electrophysiology  consultation because of concerns of high risk for sudden cardiac death.  The  patient is a 60 year old gentleman who presented with left arm tingling and  was seen by Dr. Verl [name] with concern about multiple risk factors.  He admitted  the patient for catheterization today which demonstrated severe three-vessel  disease not involving his LAD.  His ejection fraction was 20% with an  anterior wall motion with anterior akinesis and inferior hypokinesis.  He is  being referred for consideration of surgery and his decision regarding that  is not yet complete.   At baseline, he has mild shortness of breath with exertion walking up hills.  He climbs stairs at his place of work without significant limitations.  He  has some peripheral edema but no orthopnea or nocturnal dyspnea.  He has had  some problems with left arm chest discomfort.   He has a history of syncope that occurred three years ago.  He was driving  home and over the span of a block he went from being awake to loss of  consciousness.  He has had no other episodes before or since.   His past medical history is notable for hypertension, cigarette use, cocaine  use, and he also has HIV disease with his count so high he is not going to  die from HIV.   MEDICATIONS:  1. Neurontin 300 mg.  2. Colchicine 0.6.  3. Enalapril 5.  4. Norvir 100 b.i.d.  5. Combivir 300 p.o. b.i.d.  6. Crixivan 400 b.i.d.   He has no known drug allergies.   SOCIAL HISTORY:  Includes the  above.  He is single.  He has no children.  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   REVIEW OF SYSTEMS:  Noncontributory.   FAMILY HISTORY:  Strikingly positive for malignant cardiac disease.   PHYSICAL EXAMINATION:  GENERAL:  He is a well-developed, well-nourished,  middle-aged African-American male in no acute distress.  VITAL SIGNS:  His blood pressure was 132/81, pulse 79.  HEENT:  Exam demonstrated no __________.  His neck veins were flat.  Carotids were brisk and full bilaterally without bruits.  BACK:  Without kyphosis or scoliosis.  LUNGS:  Clear.  HEART:  S4 was present.  ABDOMEN:  Soft with active bowel sounds, without midline pulsation,  hepatosplenomegaly.  EXTREMITIES:  Femoral pulses were 2+.  Distal pulses were intact.  There is  no cyanosis, clubbing or edema.  NEUROLOGICAL:  Exam is grossly normal.   LABORATORY DATA:  Normal BMET and a normal Hemogram.   His electrocardiogram dated January 20 demonstrated sinus rhythm at 68 with  interval 0.17/.08/.41 with an axis 45 degrees.  There is mild ST segment  depression and T-wave inversions in the lateral leads.   IMPRESSION:  1. Ischemic versus nonischemic component of cardiomyopathy (a) three-vessel     coronary disease although not involving his left anterior descending.     (b)  Ejection fraction of 25%.  (c)  Wall motion abnormalities consistent     with prior with prior myocardial infarction.  2. Class II with a narrow QRS.  3. Remote syncope.  4. History of cocaine use.  5. Hypertension.  6. Question gallop.  7. HIV disease question status.   DISCUSSION:  The patient has significant risk for sudden cardiac death  secondary to his ischemic cardiomyopathy due to depression of  LV function.  His narrow QRS mitigates against this somewhat but not entirely.  His remote  syncope is also of some concern though it is hard to know what  to do with it  at this late date.   The best primary prevention for sudden death would include revascularization  which has been recommended but which he has not quite yet decided about.  We  should defer final decision making until which time he has a chance to  resolve the issue regarding surgery.  In the event that he chooses to  undergo surgery, we would be glad to see him sometime afterwards.  If he  were to decline surgery, I would appreciate that I would be referred back  for ICD implantation and potentially to be enrolled as part of the master  protocol.   Thank you for this consultation.                                               Jacob [name], M.D. [facility]    SCK/MEDQ  D:  11/19/2002  T:  11/19/2002  Job:  [id number]   cc:   Jacob [name], M.D.  [phone] Jacob [name]  [place] [address]  Fax: [facility]

## 2011-03-11 NOTE — H&P (Signed)
NAME:  Jacob [name], Jacob [name] NO.:  [id number]   MEDICAL RECORD NO.:  [id number]          PATIENT TYPE:  INP   LOCATION:  0105                         FACILITY:  [facility]   PHYSICIAN:  [name] T. Hoxworth, M.D.DATE OF BIRTH:  [birth year]   DATE OF ADMISSION:  06/14/2006  DATE OF DISCHARGE:                                HISTORY & PHYSICAL   CHIEF COMPLAINTS:  Abdominal pain.   HISTORY OF PRESENT ILLNESS:  Jacob [name] is a 60 year old black male who  states about five days ago he had the onset of intermittent right upper  quadrant abdominal pain.  This lasted for a couple of days and then seemed  to improve and then about two days ago he had the onset of more severe  constant right upper quadrant pain that has persisted.  It became very  severe last night and he presented to the emergency room.  This had been  associated with nausea, but no vomiting.  He thought maybe he needed to have  a bowel movement and took a laxative with good results, but had no  improvement in the pain.  He has not had any fever or chills.  No jaundice  noted.  He has not had any similar pain previously and denies any chronic GI  complaints.  The pain seems to be made worse by eating.  He did have some  increase in the pain and some shortness of breath when trying to taking deep  a breath last night and it did prompt him to come to the emergency room.   PAST MEDICAL HISTORY:   SURGERY:  Coronary artery bypass graft in 2004.   MEDICAL:  He is followed at [facility] and by Dr. Quentin Cornwall in [facility] and Dr. Verl Blalock by cardiology.  He is followed for stable coronary  artery disease, HIV positive status, hypertension, diabetes mellitus, gout,  hyperlipidemia.   MEDICATIONS:  1. Coreg 25 mg twice daily.  2. Combivir oral 300 mg twice daily.  3. Allopurinol 300 mg daily.  4. Aspirin 325 mg daily.  5. Enalapril 2.5 mg at bedtime.  6. Lipitor 40 mg at bedtime.  7. Glucophage 1000 mg twice  daily.  8. Glucotrol XL 20 mg once a day.  9. Reyataz 400 mg once a day.  10.Neurontin 300 mg three times a day.  11.Allegra 100 mg daily.  12.Colchicine 0.6 every evening.   ALLERGIES:  No known drug allergies.   SOCIAL HISTORY:  Single, disabled.  Smokes a pack of cigarettes per day.  Drinks occasional alcohol.   FAMILY HISTORY:  Noncontributory.   REVIEW OF SYSTEMS:  GENERAL:  No fever, chills, malaise.  RESPIRATORY:  Some  mild shortness of breath with this illness, otherwise negative.  CARDIOVASCULAR:  No recent chest pain, palpitations.  GI: As above.  GU:  Normal urination without blood or pain.  MUSCULOSKELETAL:  No joint pain,  swelling.  HEMATOLOGIC:  No history of blood clots or abnormal bleeding.  NEURO:  Positive for some neuropathy in his feet.   PHYSICAL EXAMINATION:  VITAL SIGNS:  He is  afebrile.  Pulse 75, respirations  20, blood pressure 145/96.  GENERAL:  Well-developed black male in no acute stress.  SKIN:  Warm, dry.  No rash or infection.  HEENT:  No palpable masses or thyromegaly.  Sclerae non-icteric.  Oropharynx  clear.  LUNGS:  Clear without wheezing or increased work of breathing.  CARDIAC:  Regular rate and rhythm.  No murmurs.  Well-healed sternotomy.  LUNGS:  Clear without wheezing or increased work of breathing.  ABDOMEN:  Nondistended.  There is well localized right upper quadrant  tenderness with guarding.  No masses.  No organomegaly.  EXTREMITIES:  No joint swelling or deformity.  NEUROLOGIC:  Alert and oriented.  Motor and sensory examinations grossly  normal.   LABORATORY:  Electrolytes are normal.  Glucose 131.  LFTs within normal  limits except albumin is 3.1, lipase normal at 28.  Urinalysis negative.   The CT scan of the abdomen is reviewed.  This shows evidence of  cholecystitis with a thickened gallbladder wall and inflammatory changes.  There are some hyperdense areas in the distal common bile duct of concern  for distal common  bile duct stones.   ASSESSMENT AND PLAN:  1. Acute cholecystitis with questionable common bile duct stone on CT.      His common bile duct, however, is of normal caliber and LFTs are      normal.  2. History of coronary artery disease status post coronary artery bypass      graft.  3. HIV status positive.  4. Hypertension.  5. Gout.   PLAN:  The patient will be admitted.  He is being treated with pain  medication, IV fluids, broad-spectrum antibiotics.  Will plan the  laparoscopic cholecystectomy with careful intraoperative cholangiogram.      [name] T. Hoxworth, M.D.  Electronically Signed     BTH/MEDQ  D:  06/14/2006  T:  06/14/2006  Job:  [id number]

## 2011-03-11 NOTE — Discharge Summary (Signed)
[name], Jacob [name] NO.:  [id number]   MEDICAL RECORD NO.:  [id number]          PATIENT TYPE:  INP   LOCATION:  [place]                         FACILITY:  [facility]   PHYSICIAN:  Imogene Burn. Georgette Dover, M.D. DATE OF BIRTH:  [birth year]   DATE OF ADMISSION:  06/14/2006  DATE OF DISCHARGE:  06/17/2006                                 DISCHARGE SUMMARY   ADMISSION DIAGNOSES:  1. Acute cholecystitis.  2. Choledocholithiasis.   PROCEDURE PERFORMED:  1. Laparoscopic cholecystectomy.  2. Intraoperative cholangiogram.  3. ERCP with sphincterotomy and stone extraction.   BRIEF HISTORY:  The patient is a 60 year old male with a past medical  history significant for HIV who presents with severe abdominal pain for 5  days.  This was associated with some nausea but no fever.  He was evaluated  in the emergency department and was noted to have an elevated white count.  CT scan showed evidence of cholecystitis with possible common duct stones.  Surgery was consulted and the patient was admitted to hospital.  He  underwent an urgent laparoscopic cholecystectomy on June 14, 2006.  The  patient had a very inflamed gallbladder, which was removed.  The  intraoperative cholangiogram showed some filling defects in the common bile  duct.  Dr. Fuller Plan of GI was consulted and the patient was seen in the  hospital.  He underwent an ERCP on June 16, 2006.  This was successful in  extracting stones.  A sphincterotomy was performed.  He was doing well and  was discharged home on June 17, 2006.  He was given Percocet for pain.  He  is to return in 10 days to the office for staple removal.      Imogene Burn. Tsuei, M.D.  Electronically Signed     MKT/MEDQ  D:  06/27/2006  T:  06/27/2006  Job:  [id number]   cc:   Pricilla Riffle. Fuller Plan, MD, FACG  [address]  [place] [address]

## 2011-03-11 NOTE — Discharge Summary (Signed)
NAME:  Jacob [name], Jacob [name] NO.:  [id number]   MEDICAL RECORD NO.:  [id number]                   PATIENT TYPE:  INP   LOCATION:  2001                                 FACILITY:  [facility]   PHYSICIAN:  Lilia Argue. Servando Snare, M.D.            DATE OF BIRTH:  29-Dec-1950   DATE OF ADMISSION:  02/24/2003  DATE OF DISCHARGE:  02/27/2003                                 DISCHARGE SUMMARY   ADMISSION DIAGNOSIS:  Two vessel coronary artery disease and left  ventricular dysfunction.   PAST MEDICAL HISTORY:  1. Hypertension.  2. Hyperlipidemia.  3. HIV positive, currently treated with three oral agents.  4. Tobacco use, currently one pack a day for the past 30 years.   ALLERGIES:  The patient has no known drug allergies.   BRIEF HISTORY:  The patient is a 60 year old African-American male.  In  December 2003 he had an episode of chest tightness with radiation to his  arms while he was walking to [employer].  His symptoms relieved  with rest. He was subsequently referred to the [facility]iology group and  underwent cardiac catheterization by Dr. Olevia Perches.  After his catheterization,  there was discussion regarding whether his heart disease would best be  treated by medical therapy alone or in conjunction with surgery. He was  referred to [facility] in early April and was seen in the office by Dr. Servando Snare  on February 06, 2003.  After examination of the patient and review of the  medical records, Dr. Servando Snare recommended proceeding with coronary artery  bypass grafting and reviewed with the patient.  The procedure was thoroughly  discussed with the patient and he went home to review his options. He  returned to the office approximately two weeks later and agreed to proceed  with surgery.   HOSPITAL COURSE:  On Feb 24, 2003 the patient was brought to [facility] under the care of Dr. Lanelle Bal.  He underwent the following  surgical procedure.  Coronary  artery bypass grafting times two.  Grafts  placed at the time of the procedure included a saphenous vein graft to the  intermediate artery and saphenous vein graft to the posterior descending  coronary artery.  Vein was harvested from his right thigh using Endo vein  harvesting technique.  He tolerated the procedure well and was transferred  in stable condition to the SICU.  He remained hemodynamically stable in the  immediate postoperative period and was extubated several hours after arrival  in the SICU.   The infectious disease service was consulted regarding restarting the  patient's HIV drug regimen.  Dr. [name] reviewed the patient's records  from [facility] which showed that his CD4 count was 920 with an HIV viral  load of 260.  These were done on January 06, 2003.  He felt that the patient's  HIV was well controlled and he had a CD count  being normal.  Dr. [name]  felt that this should not interfere with his CABG recovery . There were no  changes made in his HIV medications.   The patient progressed very well in recovery from his surgery. The morning  of Feb 26, 2003, postoperative day three, he reports feeling very well.  His  vital signs were stable with a blood pressure of 106/76, he is afebrile and  his room air saturation is 95%.  His heart has maintained normal sinus  rhythm and this morning his rate is approximately 88.  His lungs are clear.  He is tolerating a regular diet without nausea.  He is voiding without any  difficulty.  His incisions are all clean and dry.  He does have 1+ edema in  bilateral lower legs.  He is ambulating independently and his gait is well  controlled.  It should be noted that he did have a transient rise in his  creatinine on May 5 to 1.7; the morning of May 6 it was down to 1.2.  The  patient is ready for discharge home.  Smoking cessation has been discussed  with the patient and he reports that he is committed to not smoking.   The patient   has been seen by case management while he was here at the  hospital and has completed Medicaid and disability applications today.   LABORATORY DATA:  Feb 26, 2003:  CBC:  White blood cell count 8.3, hemoglobin  11.2, hematocrit 32.6, platelets 143,000.  May 6 chemistries included a  sodium of 140, potassium 4.3, BUN 15, creatinine 1.2, glucose 92.   DISCHARGE CONDITION:  Improved.   DISCHARGE MEDICATIONS:  He has been instructed to resume his home  medications of:  1. Ecotrin 325 mg daily  2. Denavir 800 mg b.i.d.  3. Norvir 100 mg b.i.d.  4. Combivir 150/300 mg b.i.d.  5. Coreg 6.25 mg b.i.d.  6. Enalapril 5 mg daily.  7. Gemfibrozil 600 mg daily.  8. Allopurinol 300 mg daily.   New medications will include:  1. Colace 200 mg p.o. daily.  2. Lasix 40 mg p.o. daily for five days.  3. Potassium chloride 20 mEq p.o. daily for five days.  4. For pain he may have Tylox one to two p.o. q.4 to 6h p.r.n. for moderate     to severe pain or Tylenol 325 mg one to two p.o. q.4 to 6h p.r.n. for     mild pain.   ACTIVITY:  He is asked to refrain from any driving or heavy lifting.  He was  also discharged to continue his breathing exercises and daily walking.   DIET:  Continue low salt diet.   SPECIAL INSTRUCTIONS:  He may shower beginning Friday, May 7.  Until then he  is to cleanse his incisions daily with mild soap and water. If the incisions  are hot, red, swollen, burning or fever greater than 101.0 degrees  Fahrenheit he is to call Dr. Everrett Coombe office.   FOLLOW UP:  1. Appointments will be made for him at the [facility] office next week for chest     tube suture removal at [facility] per the nurse.  2.     An appointment has been made to see Dr. Verl Blalock on May 20 at 9:30 a.m. He will      have a chest x-ray taken that day.  3. He has been instructed to see Dr. Servando Snare at the [place] office on  Thursday, June 24 at 11 a.m.    Mardene Celeste, R.N.                  Lilia Argue. Servando Snare,  M.D.    CTK/MEDQ  D:  02/27/2003  T:  03/01/2003  Job:  [id number]   cc:   Marijo Conception. Wall, M.D.   [name], M.D.  [place]  [place] [address]  Fax: [place]. Tomma Lightning, M.D.  [phone] [place]  [place] [address]  Fax: [phone]

## 2011-03-11 NOTE — Assessment & Plan Note (Signed)
[facility] OFFICE NOTE   Gabreil, Kilner ABDIRASHID DRENNON                      MRN:          [id number]  DATE:02/21/2007                            DOB:          24-Mar-1951    Mr. Wengel returns today for followup of his response to carvedilol.  We  started him on 12.5 mg b.i.d. which he is taking.  He has stayed away  from cocaine and secondary exposure as well.  He feels remarkably well.  He is having no angina, no orthopnea, no PND, peripheral edema.   His blood pressure today is 124/76, pulse 68 and regular, that is down  from 85 beats per minute.  His weight is 197.  The rest of his exam is unchanged.   PLAN:  1. Continue carvedilol 12.5 b.i.d.  2. Continue other medication.  3. See me back in 6 months.     Thomas C. Verl Blalock, MD, [facility]  Electronically Signed    TCW/MedQ  DD: 02/21/2007  DT: 02/21/2007  Job #: [id number]   cc:   Arelia Longest. [name] Nasuti., M.D.

## 2011-03-11 NOTE — Cardiovascular Report (Signed)
NAMEKENTYN, MAGNANI NO.:  [id number]   MEDICAL RECORD NO.:  [id number]          PATIENT TYPE:  OIB   LOCATION:  6501                         FACILITY:  [facility]   PHYSICIAN:  Ernestine Mcmurray, M.D. LHCDATE OF BIRTH:  Mar 06, 1951   DATE OF PROCEDURE:  12/19/2004  DATE OF DISCHARGE:  01/03/2005                              CARDIAC CATHETERIZATION   PROCEDURE PERFORMED:  1.  Left heart catheterization with selective coronary angiography.  2.  Ventriculography.  3.  Graft injection.   DIAGNOSIS:  1.  Severe native three vessel coronary artery disease.  2.  Patent graft anatomy.  3.  Ischemic cardiomyopathy with normal left ventricular end diastolic      pressure.   DESCRIPTION OF PROCEDURE:  After informed consent was obtained, the patient  was brought to the catheterization laboratory.  The catheterization was  performed because of chest pain and an abnormal Cardiolite study.  Judkins  catheters were used for coronary angiography.  Ventriculography was  performed with a pigtail catheter.  At the termination of the procedure, all  catheters and sheaths were removed with no complications encountered.   HEMODYNAMICS:  Left ventricular pressure 137/10 mmHg, aortic pressure 137/79  mmHg.   VENTRICULOGRAPHY:  Single plane RAO projection with ejection fraction 25-  30%, anterior akinesis, and apical hypokinesis, there is also mid inferior  akinesis.   SELECTIVE CORONARY ANGIOGRAPHY:  1.  Left main coronary artery was a large caliber vessel with no evidence of      flow limiting disease.  2.  Left anterior descending artery showed no evidence of significant flow      limiting lesions.  The first diagonal branch was occluded but received      collaterals from the ramus branch.  3.  The circumflex coronary artery was small with a proximal 70% stenosis.      The ramus branch was occluded.  4.  The right coronary artery was occluded at the ostium.   GRAFT ANATOMY:  The  saphenous vein graft to the ramus which was a large  vessel was patent.  There were collaterals from the ramus to the first  diagonal branch.  Saphenous vein graft to the RCA was patent.  There was a  distal 30% stenosis within the graft.   CONCLUSION:  Severe native coronary artery disease with patent graft  anatomy.  The circumflex is a small vessel and potentially could be a source  for ischemia but will review results with Dr. Olevia Perches, but doubt this would  be a vessel that we would consider for PCI.       GED/MEDQ  D:  05/18/2005  T:  05/18/2005  Job:  [id number]   cc:   [facility]
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
## 2011-03-31 ENCOUNTER — Other Ambulatory Visit: Payer: Self-pay | Admitting: Internal Medicine

## 2011-04-01 ENCOUNTER — Other Ambulatory Visit: Payer: Self-pay | Admitting: *Deleted

## 2011-04-01 MED ORDER — ALLOPURINOL 300 MG PO TABS: 300.0000 mg | ORAL_TABLET | Freq: Every day | ORAL | Status: DC

## 2011-04-04 ENCOUNTER — Ambulatory Visit (INDEPENDENT_AMBULATORY_CARE_PROVIDER_SITE_OTHER): Payer: PRIVATE HEALTH INSURANCE | Admitting: Internal Medicine

## 2011-04-04 ENCOUNTER — Encounter: Payer: Self-pay | Admitting: Internal Medicine

## 2011-04-04 VITALS — BP 127/74 | HR 71 | Temp 98.3°F | Ht 75.0 in | Wt 180.0 lb

## 2011-04-04 DIAGNOSIS — Z79899 Other long term (current) drug therapy: Secondary | ICD-10-CM

## 2011-04-04 DIAGNOSIS — B20 Human immunodeficiency virus [HIV] disease: Secondary | ICD-10-CM

## 2011-04-04 DIAGNOSIS — A539 Syphilis, unspecified: Secondary | ICD-10-CM

## 2011-04-04 DIAGNOSIS — S72009A Fracture of unspecified part of neck of unspecified femur, initial encounter for closed fracture: Secondary | ICD-10-CM | POA: Insufficient documentation

## 2011-04-04 DIAGNOSIS — S72002A Fracture of unspecified part of neck of left femur, initial encounter for closed fracture: Secondary | ICD-10-CM | POA: Insufficient documentation

## 2011-04-04 LAB — CBC
HCT: 40.2 % (ref 39.0–52.0)
MCH: 36.9 pg — ABNORMAL HIGH (ref 26.0–34.0)
MCV: 108.9 fL — ABNORMAL HIGH (ref 78.0–100.0)
RBC: 3.69 MIL/uL — ABNORMAL LOW (ref 4.22–5.81)
WBC: 5.9 10*3/uL (ref 4.0–10.5)

## 2011-04-04 MED ORDER — ACETAMINOPHEN-CODEINE 300-30 MG PO TABS: 1.0000 | ORAL_TABLET | Freq: Four times a day (QID) | ORAL | Status: DC | PRN

## 2011-04-04 NOTE — Assessment & Plan Note (Signed)
He needs a full set of lab work today. His CD4 count was 900 and his viral load was undetectable at less than 20 last November. His adherence has been very good and I expect that his infection remains under good control.

## 2011-04-04 NOTE — Progress Notes (Signed)
  Subjective:    [name], male    DOB: 1951-02-25, 59 y.o.   MRN: [id number]  HPI Jacob [name] is in for his routine visit. He denies missing any doses of his Combivir and Reyataz since his last visit. He continues to monitor his diabetes and weighs daily to manage his heart failure. He continues to work 2 days each week at the [employer]. Over the last 2-3 weeks he's been noticing a dramatic increase in his left hip pain. He underwent intramedullary nailing of a left intertrochanteric hip fracture in 2008. He does not recall any recent injury to his hip or lower back. Over the last week the pain has increased quite a bit and is there pretty much all the time. He has not noticed any swelling or redness of his hip and he has not had any fever, chills, or sweats. He says that he call Dr. Jess Barters office and was told by a nurse there that he should be seen here first.    Review of Systems     Objective:   Physical Exam  Constitutional: He appears well-developed and well-nourished. No distress.  HENT:  Mouth/Throat: Oropharynx is clear and moist. No oropharyngeal exudate.  Cardiovascular: Normal rate, regular rhythm and normal heart sounds.   Pulmonary/Chest: Breath sounds normal. He has no wheezes. He has no rales.  Musculoskeletal:       His gait is normal. Examination of his left hip shows a healed incision without any evidence of inflammation. He has good range of motion. He seems to have some weakness with hip flexion but this is symmetrical bilaterally. His deep tendon reflexes are normal.          Assessment & Plan:

## 2011-04-04 NOTE — Assessment & Plan Note (Signed)
I'm not certain what is causing his increased left hip pain but would suggest that he see Dr. Sharol Given to see if there is any problem with the intramedullary nail.

## 2011-04-05 ENCOUNTER — Other Ambulatory Visit: Payer: Self-pay

## 2011-04-05 LAB — COMPREHENSIVE METABOLIC PANEL
ALT: 35 U/L (ref 0–53)
BUN: 19 mg/dL (ref 6–23)
CO2: 25 mEq/L (ref 19–32)
Calcium: 9.2 mg/dL (ref 8.4–10.5)
Chloride: 107 mEq/L (ref 96–112)
Creat: 1.25 mg/dL (ref 0.50–1.35)
Glucose, Bld: 100 mg/dL — ABNORMAL HIGH (ref 70–99)
Total Bilirubin: 1.6 mg/dL — ABNORMAL HIGH (ref 0.3–1.2)

## 2011-04-05 LAB — LIPID PANEL
Cholesterol: 120 mg/dL (ref 0–200)
HDL: 28 mg/dL — ABNORMAL LOW (ref >39)
Total CHOL/HDL Ratio: 4.3 Ratio
Triglycerides: 442 mg/dL — ABNORMAL HIGH (ref <150)

## 2011-04-19 ENCOUNTER — Ambulatory Visit: Payer: Self-pay | Admitting: Internal Medicine

## 2011-05-23 ENCOUNTER — Encounter: Payer: Self-pay | Admitting: Cardiology

## 2011-05-27 ENCOUNTER — Encounter: Payer: Self-pay | Admitting: Cardiology

## 2011-05-27 ENCOUNTER — Ambulatory Visit (INDEPENDENT_AMBULATORY_CARE_PROVIDER_SITE_OTHER): Payer: PRIVATE HEALTH INSURANCE | Admitting: Cardiology

## 2011-05-27 VITALS — BP 112/68 | HR 60 | Ht 75.0 in | Wt 177.0 lb

## 2011-05-27 DIAGNOSIS — I2510 Atherosclerotic heart disease of native coronary artery without angina pectoris: Secondary | ICD-10-CM

## 2011-05-27 DIAGNOSIS — I5022 Chronic systolic (congestive) heart failure: Secondary | ICD-10-CM

## 2011-05-27 DIAGNOSIS — F17200 Nicotine dependence, unspecified, uncomplicated: Secondary | ICD-10-CM

## 2011-05-27 MED ORDER — NITROGLYCERIN 0.3 MG SL SUBL: 0.3000 mg | SUBLINGUAL_TABLET | SUBLINGUAL | Status: DC | PRN

## 2011-05-27 MED ORDER — CARVEDILOL 25 MG PO TABS: 25.0000 mg | ORAL_TABLET | Freq: Two times a day (BID) | ORAL | Status: DC

## 2011-05-27 NOTE — Assessment & Plan Note (Signed)
Encouraged to quit were decreased as much as possible

## 2011-05-27 NOTE — Assessment & Plan Note (Signed)
Stable, no change in treatment.

## 2011-05-27 NOTE — Patient Instructions (Addendum)
Your physician recommends that you schedule a follow-up appointment in: 6 months with Dr. Verl Blalock

## 2011-05-27 NOTE — Progress Notes (Signed)
HPI Jacob [name] returns for the evaluation and management of his coronary disease. He denies angina ischemic symptoms. He's having orthopnea PND or peripheral edema. His history of chronic systolic heart failure as well.  He still drinks about 4 beers a day and also smokes quite a bit. Encouraged him again to stop smoking and moderate his drinking. Past Medical History  Diagnosis Date  . Syphilis   . CAD (coronary artery disease)     s/p surgery  . DM2 (diabetes mellitus, type 2)   . HIV disease   . AR (allergic rhinitis)   . Gout   . HLD (hyperlipidemia)   . HTN (hypertension)   . Ischemic cardiomyopathy     EF 30%  . Chronic systolic heart failure   . PN (peripheral neuropathy)   . Drug abuse     hx; tested for cocaine as recently as 2/08. says he is not using drugs now - avoided defib. for this reason     Past Surgical History  Procedure Date  . Coronary artery bypass graft   . Left intertrochanteric hip fracture     s/p intermedullary nail placement 2/08  . Left transverse mandibular fracture 10/05  . Laproscopic cholecystectomy 8/07    No family history on file.  History   Social History  . Marital Status: Single    Spouse Name: N/A    Number of Children: N/A  . Years of Education: N/A   Occupational History  . Not on file.   Social History Main Topics  . Smoking status: Current Everyday Smoker -- 0.5 packs/day for 36 years    Types: Cigarettes  . Smokeless tobacco: Never Used  . Alcohol Use: No     4 beers daily   . Drug Use: No     none since 2008   . Sexually Active: Not Currently     declined condoms   Other Topics Concern  . Not on file   Social History Narrative   Retired.     No Known Allergies  Current Outpatient Prescriptions  Medication Sig Dispense Refill  . Acetaminophen-Codeine 300-30 MG per tablet Take 1-2 tablets by mouth every 6 (six) hours as needed for pain.  90 tablet  2  . allopurinol (ZYLOPRIM) 300 MG tablet Take 1 tablet  (300 mg total) by mouth daily.  30 tablet  6  . aspirin 325 MG tablet Take 325 mg by mouth daily.        [name] atazanavir (REYATAZ) 200 MG capsule Take 400 mg by mouth daily with breakfast.        . atorvastatin (LIPITOR) 40 MG tablet Take 40 mg by mouth daily.        . carvedilol (COREG) 25 MG tablet Take 1 tablet (25 mg total) by mouth 2 (two) times daily with a meal.  180 tablet  3  . enalapril (VASOTEC) 5 MG tablet Take 1 tablet (5 mg total) by mouth 2 (two) times daily.  180 tablet  3  . lamiVUDine-zidovudine (COMBIVIR) 150-300 MG per tablet Take 1 tablet by mouth 2 (two) times daily.        . metFORMIN (GLUCOPHAGE) 500 MG tablet Take 500 mg by mouth 2 (two) times daily with a meal.        . naftifine (NAFTIN) 1 % cream Apply topically daily.        . nitroGLYCERIN (NITROSTAT) 0.3 MG SL tablet Place 1 tablet (0.3 mg total) under the tongue every 5 (five) minutes  as needed.  90 tablet  6  . traMADol (ULTRAM) 50 MG tablet Take 50 mg by mouth daily as needed.          ROS Negative other than HPI.   PE General Appearance: well developed, well nourished in no acute distress HEENT: symmetrical face, PERRLA, good dentition  Neck: no JVD, thyromegaly, or adenopathy, trachea midline Chest: symmetric without deformity Cardiac: PMI non-displaced, RRR, normal S1, S2, no gallop or murmur Lung: clear to ausculation and percussion Vascular: all pulses full without bruits  Abdominal: nondistended, nontender, good bowel sounds, no HSM, no bruits Extremities: no cyanosis, clubbing or edema, no sign of DVT, no varicosities  Skin: normal color, no rashes Neuro: alert and oriented x 3, non-focal Pysch: normal affect Filed Vitals:   05/27/11 1118  BP: 112/68  Pulse: 60  Height: 6\' 3"  (1.905 m)  Weight: 177 lb (80.287 kg)    EKG  Labs and Studies Reviewed.   Lab Results  Component Value Date   WBC 5.9 04/04/2011   HGB 13.6 04/04/2011   HCT 40.2 04/04/2011   MCV 108.9* 04/04/2011   PLT 192  04/04/2011      Chemistry      Component Value Date/Time   NA 138 04/04/2011 1556   K 4.8 04/04/2011 1556   CL 107 04/04/2011 1556   CO2 25 04/04/2011 1556   BUN 19 04/04/2011 1556   CREATININE 1.25 04/04/2011 1556   CREATININE 1.30 09/20/2010 2058      Component Value Date/Time   CALCIUM 9.2 04/04/2011 1556   ALKPHOS 74 04/04/2011 1556   AST 38* 04/04/2011 1556   ALT 35 04/04/2011 1556   BILITOT 1.6* 04/04/2011 1556       Lab Results  Component Value Date   CHOL 120 04/04/2011   CHOL 141 09/20/2010   CHOL 145 04/01/2010   Lab Results  Component Value Date   HDL 28* 04/04/2011   HDL 39* 09/20/2010   HDL 35* 04/01/2010   Lab Results  Component Value Date   LDLCALC Comment:   Not calculated due to Triglyceride >400. Suggest ordering Direct LDL (Unit Code: [phone]).   Total Cholesterol/HDL Ratio:CHD Risk                        Coronary Heart Disease Risk Table                                        Men       Women          1/2 Average Risk              3.4        3.3              Average Risk              5.0        4.4           2X Average Risk              9.6        7.1           3X Average Risk             23.4       11.0 Use the calculated Patient Ratio above and the CHD Risk table  to determine the patient's CHD Risk. ATP III Classification (LDL):       < 100        mg/dL         Optimal      100 - 129     mg/dL         Near or Above Optimal      130 - 159     mg/dL         Borderline High      160 - 189     mg/dL         High       > 190        mg/dL         Very High   04/04/2011   LDLCALC * mg/dL* 09/20/2010   LDLCALC 83 04/01/2010   Lab Results  Component Value Date   TRIG 442* 04/04/2011   TRIG 441* 09/20/2010   TRIG 136 04/01/2010   Lab Results  Component Value Date   CHOLHDL 4.3 04/04/2011   CHOLHDL 3.6 Ratio 09/20/2010   CHOLHDL 4.1 Ratio 04/01/2010   Lab Results  Component Value Date   HGBA1C 5.2 09/20/2010   Lab Results  Component Value Date   ALT 35 04/04/2011   AST 38*  04/04/2011   ALKPHOS 74 04/04/2011   BILITOT 1.6* 04/04/2011   Lab Results  Component Value Date   TSH 1.082 04/01/2010

## 2011-05-31 ENCOUNTER — Other Ambulatory Visit: Payer: Self-pay | Admitting: Licensed Clinical Social Worker

## 2011-05-31 DIAGNOSIS — E785 Hyperlipidemia, unspecified: Secondary | ICD-10-CM

## 2011-05-31 MED ORDER — ATORVASTATIN CALCIUM 40 MG PO TABS: 40.0000 mg | ORAL_TABLET | Freq: Every day | ORAL | Status: DC

## 2011-06-21 ENCOUNTER — Other Ambulatory Visit: Payer: PRIVATE HEALTH INSURANCE

## 2011-06-21 DIAGNOSIS — B20 Human immunodeficiency virus [HIV] disease: Secondary | ICD-10-CM

## 2011-06-22 LAB — T-HELPER CELL (CD4) - (RCID CLINIC ONLY): CD4 % Helper T Cell: 35 % (ref 33–55)

## 2011-06-23 LAB — HIV-1 RNA QUANT-NO REFLEX-BLD: HIV-1 RNA Quant, Log: <1.3 {Log} (ref <1.30)

## 2011-06-30 ENCOUNTER — Encounter: Payer: Self-pay | Admitting: Internal Medicine

## 2011-06-30 ENCOUNTER — Inpatient Hospital Stay (HOSPITAL_COMMUNITY)
Admission: EM | Admit: 2011-06-30 | Discharge: 2011-07-10 | DRG: 683 | Disposition: A | Discharge status: Home / Self Care | Payer: PRIVATE HEALTH INSURANCE | Attending: Internal Medicine | Admitting: Internal Medicine

## 2011-06-30 ENCOUNTER — Emergency Department (HOSPITAL_COMMUNITY): Payer: PRIVATE HEALTH INSURANCE

## 2011-06-30 DIAGNOSIS — Z951 Presence of aortocoronary bypass graft: Secondary | ICD-10-CM

## 2011-06-30 DIAGNOSIS — D696 Thrombocytopenia, unspecified: Secondary | ICD-10-CM | POA: Diagnosis present

## 2011-06-30 DIAGNOSIS — Z8249 Family history of ischemic heart disease and other diseases of the circulatory system: Secondary | ICD-10-CM

## 2011-06-30 DIAGNOSIS — T391X5A Adverse effect of 4-Aminophenol derivatives, initial encounter: Secondary | ICD-10-CM | POA: Diagnosis present

## 2011-06-30 DIAGNOSIS — F10239 Alcohol dependence with withdrawal, unspecified: Secondary | ICD-10-CM | POA: Diagnosis present

## 2011-06-30 DIAGNOSIS — M109 Gout, unspecified: Secondary | ICD-10-CM | POA: Diagnosis present

## 2011-06-30 DIAGNOSIS — Z7982 Long term (current) use of aspirin: Secondary | ICD-10-CM

## 2011-06-30 DIAGNOSIS — Z781 Physical restraint status: Secondary | ICD-10-CM | POA: Diagnosis present

## 2011-06-30 DIAGNOSIS — Z79899 Other long term (current) drug therapy: Secondary | ICD-10-CM

## 2011-06-30 DIAGNOSIS — I10 Essential (primary) hypertension: Secondary | ICD-10-CM | POA: Diagnosis present

## 2011-06-30 DIAGNOSIS — F1410 Cocaine abuse, uncomplicated: Secondary | ICD-10-CM | POA: Diagnosis present

## 2011-06-30 DIAGNOSIS — R7401 Elevation of levels of liver transaminase levels: Secondary | ICD-10-CM | POA: Diagnosis present

## 2011-06-30 DIAGNOSIS — R7402 Elevation of levels of lactic acid dehydrogenase (LDH): Secondary | ICD-10-CM | POA: Diagnosis present

## 2011-06-30 DIAGNOSIS — Z21 Asymptomatic human immunodeficiency virus [HIV] infection status: Secondary | ICD-10-CM | POA: Diagnosis present

## 2011-06-30 DIAGNOSIS — I2510 Atherosclerotic heart disease of native coronary artery without angina pectoris: Secondary | ICD-10-CM | POA: Diagnosis present

## 2011-06-30 DIAGNOSIS — R000 Tachycardia, unspecified: Secondary | ICD-10-CM | POA: Diagnosis present

## 2011-06-30 DIAGNOSIS — IMO0002 Reserved for concepts with insufficient information to code with codable children: Secondary | ICD-10-CM | POA: Diagnosis present

## 2011-06-30 DIAGNOSIS — F10939 Alcohol use, unspecified with withdrawal, unspecified: Secondary | ICD-10-CM | POA: Diagnosis present

## 2011-06-30 DIAGNOSIS — F1020 Alcohol dependence, uncomplicated: Secondary | ICD-10-CM | POA: Diagnosis present

## 2011-06-30 DIAGNOSIS — S63509A Unspecified sprain of unspecified wrist, initial encounter: Secondary | ICD-10-CM | POA: Diagnosis not present

## 2011-06-30 DIAGNOSIS — N179 Acute kidney failure, unspecified: Principal | ICD-10-CM | POA: Diagnosis present

## 2011-06-30 LAB — CARDIAC PANEL(CRET KIN+CKTOT+MB+TROPI)
CK, MB: 12.2 ng/mL (ref 0.3–4.0)
CK, MB: 12.5 ng/mL (ref 0.3–4.0)
Relative Index: 4 — ABNORMAL HIGH (ref 0.0–2.5)
Troponin I: <0.3 ng/mL (ref <0.30)
Troponin I: <0.3 ng/mL (ref <0.30)

## 2011-06-30 LAB — ETHANOL: Alcohol, Ethyl (B): 111 mg/dL — ABNORMAL HIGH (ref 0–11)

## 2011-06-30 LAB — CBC
HCT: 49.5 % (ref 39.0–52.0)
Hemoglobin: 18.3 g/dL — ABNORMAL HIGH (ref 13.0–17.0)
MCH: 38.5 pg — ABNORMAL HIGH (ref 26.0–34.0)
MCV: 104.2 fL — ABNORMAL HIGH (ref 78.0–100.0)
Platelets: 156 10*3/uL (ref 150–400)
RBC: 4.75 MIL/uL (ref 4.22–5.81)
WBC: 8.3 10*3/uL (ref 4.0–10.5)

## 2011-06-30 LAB — FOLATE: Folate: 19.4 ng/mL

## 2011-06-30 LAB — PHOSPHORUS: Phosphorus: 5.6 mg/dL — ABNORMAL HIGH (ref 2.3–4.6)

## 2011-06-30 LAB — GLUCOSE, CAPILLARY: Glucose-Capillary: 152 mg/dL — ABNORMAL HIGH (ref 70–99)

## 2011-06-30 LAB — POCT I-STAT, CHEM 8
BUN: 61 mg/dL — ABNORMAL HIGH (ref 6–23)
Calcium, Ion: 1.01 mmol/L — ABNORMAL LOW (ref 1.12–1.32)
Chloride: 113 mEq/L — ABNORMAL HIGH (ref 96–112)
Creatinine, Ser: 2.8 mg/dL — ABNORMAL HIGH (ref 0.50–1.35)
Glucose, Bld: 160 mg/dL — ABNORMAL HIGH (ref 70–99)
TCO2: 13 mmol/L (ref 0–100)

## 2011-06-30 LAB — PROTIME-INR: INR: 1.92 — ABNORMAL HIGH (ref 0.00–1.49)

## 2011-06-30 LAB — RAPID URINE DRUG SCREEN, HOSP PERFORMED
Benzodiazepines: NOT DETECTED
Opiates: POSITIVE — AB

## 2011-06-30 LAB — TSH: TSH: 0.763 u[IU]/mL (ref 0.350–4.500)

## 2011-06-30 LAB — BASIC METABOLIC PANEL
BUN: 57 mg/dL — ABNORMAL HIGH (ref 6–23)
Chloride: 99 mEq/L (ref 96–112)
GFR calc Af Amer: 31 mL/min — ABNORMAL LOW (ref >60)
GFR calc non Af Amer: 26 mL/min — ABNORMAL LOW (ref >60)
Potassium: 5.3 mEq/L — ABNORMAL HIGH (ref 3.5–5.1)

## 2011-06-30 LAB — POCT I-STAT TROPONIN I: Troponin i, poc: 0.04 ng/mL (ref 0.00–0.08)

## 2011-06-30 LAB — MAGNESIUM: Magnesium: 2.1 mg/dL (ref 1.5–2.5)

## 2011-06-30 LAB — HEPATIC FUNCTION PANEL
Alkaline Phosphatase: 113 U/L (ref 39–117)
Indirect Bilirubin: 0.5 mg/dL (ref 0.3–0.9)
Total Bilirubin: 0.7 mg/dL (ref 0.3–1.2)
Total Protein: 9 g/dL — ABNORMAL HIGH (ref 6.0–8.3)

## 2011-06-30 LAB — DIFFERENTIAL
Eosinophils Absolute: 0.1 10*3/uL (ref 0.0–0.7)
Lymphocytes Relative: 14 % (ref 12–46)
Lymphs Abs: 1.2 10*3/uL (ref 0.7–4.0)
Monocytes Relative: 6 % (ref 3–12)
Neutrophils Relative %: 79 % — ABNORMAL HIGH (ref 43–77)

## 2011-06-30 LAB — APTT: aPTT: 36 seconds (ref 24–37)

## 2011-06-30 NOTE — H&P (Signed)
Hospital Admission Note Date: 06/30/2011  Patient name: Jacob [name] Medical record number: [id number] Date of birth: March 26, 1951 Age: 60 y.o. Gender: male PCP: [name], MD, MD  Medical Service: [facility]) Medicine Teaching Service  Attending physician: Dr. Eppie Gibson   Pager: Resident (R2/R3): Dr. Newt Lukes   Pager: [phone] Resident (R1): Dr. Michail Sermon   Pager: [phone]  Chief Complaint:Chest pain for 5 hours  History of Present Illness:  Jacob [name] is a 60 y/o man presents to [facility] with left-sided chest pain that started at midnight on a day of admission while drinking beer.  Pain  Is intermittent and lasts approximately 30 minutes; no aggravating symptoms. Pain is non radiating and was only relieved after administration of NTG by EMS paramedic. Patient is unable to describe quality of CP. He denies any SOB, pleurisy, fever, chills, N/V. He reports "drenching sweats" at the onset of his CP for which "he took a shower."  He denies similar CP in the past prior to having open heart surgery in 2005. Patient states that he also used cocaine 3 days ago without any CP at that time.He reports no abdominal pain or distension; no orthopnea or PND or leg swelling. He reports 1 day history of diarrhea, described as loose stools without mucus; 5-6 episodes of it and one episode of dark bloody streaks in stool. No recent use of antibiotics, travels, drinking well water or exposure to sick persons.    Current Outpatient Prescriptions  Medication Sig Dispense Refill  . Acetaminophen-Codeine 300-30 MG per tablet Take 1-2 tablets by mouth every 6 (six) hours as needed for pain.  90 tablet  2  . allopurinol (ZYLOPRIM) 300 MG tablet Take 1 tablet (300 mg total) by mouth daily.  30 tablet  6  . aspirin 325 MG tablet Take 325 mg by mouth daily.        [name] atazanavir (REYATAZ) 200 MG capsule Take 400 mg by mouth daily with breakfast.        . atorvastatin (LIPITOR) 40 MG tablet Take 1 tablet (40 mg total) by mouth  daily.  30 tablet  6  . carvedilol (COREG) 25 MG tablet Take 1 tablet (25 mg total) by mouth 2 (two) times daily with a meal.  180 tablet  3  . enalapril (VASOTEC) 5 MG tablet Take 1 tablet (5 mg total) by mouth 2 (two) times daily.  180 tablet  3  . lamiVUDine-zidovudine (COMBIVIR) 150-300 MG per tablet Take 1 tablet by mouth 2 (two) times daily.        . metFORMIN (GLUCOPHAGE) 500 MG tablet Take 500 mg by mouth 2 (two) times daily with a meal.        . naftifine (NAFTIN) 1 % cream Apply topically daily.        . nitroGLYCERIN (NITROSTAT) 0.3 MG SL tablet Place 1 tablet (0.3 mg total) under the tongue every 5 (five) minutes as needed.  90 tablet  6  . traMADol (ULTRAM) 50 MG tablet Take 50 mg by mouth daily as needed.          Allergies: Review of patient's allergies indicates no known allergies.  Past Medical History  Diagnosis Date  . Syphilis   . CAD (coronary artery disease)     s/p surgery  . DM2 (diabetes mellitus, type 2)   . HIV disease   . AR (allergic rhinitis)   . Gout   . HLD (hyperlipidemia)   . HTN (hypertension)   . Ischemic cardiomyopathy  EF 30%  . Chronic systolic heart failure   . PN (peripheral neuropathy)   . Drug abuse     hx; tested for cocaine as recently as 2/08. says he is not using drugs now - avoided defib. for this reason     Past Surgical History  Procedure Date  . Coronary artery bypass graft  2005  . Left intertrochanteric hip fracture     s/p intermedullary nail placement 2/08  . Left transverse mandibular fracture 10/05  . Laproscopic cholecystectomy 8/07    Family History: Severe CAD on both sides of his family.  History   Social History  . Marital Status: Single    Spouse Name: N/A    Number of Children: none  . Years of Education: 2 years of college   Occupational History  . Works at [employer] on PT basis   Social History Main Topics  . Smoking status: Current Everyday Smoker -- 0.5 packs/day for 36 years    Types:  Cigarettes  . Smokeless tobacco: Never Used  . Alcohol Use: No     4 beers daily   . Drug Use: No     none since 2008   . Sexually Active: Not Currently     declined condoms   Other Topics Concern  . On disability for CAD since 2006     Review of Systems: Eyes: reports red eye redness and yellow discharge for 4 days. Denies ocular pain or visual changes. GI:  positive for diarrhea, 5 episodes per day for 24 hrs; one episode of small amount (streaks) of dark blood in the stool. Also reports "rectal burning."   Physical Exam: General Appearance:  Alert, cooperative, no distress, appears stated age; ectomorph  Head:  Normocephalic, without obvious abnormality, atraumatic   Eyes:  PERRL,  OD conjunctiva with injection and thick yellow discharge; corneas clear, EOM's intact, fundi  benign, both eyes   Ears:  Normal TM's and external ear canals, both ears   Nose:  Nares normal, septum midline, mucosa normal, no drainage  or sinus tenderness   Throat:  Lips, mucosa, and tongue normal;  gums normal ; poor dentition  Neck:  Supple, symmetrical, trachea midline, no adenopathy;  thyroid: No enlargement/tenderness/nodules; no carotid  bruit or JVD   Back:  Symmetric, no curvature, ROM normal, no CVA tenderness   Lungs:  Clear to auscultation bilaterally, respirations unlabored   Chest wall:  No tenderness or deformity ; no chest wall TTP; old mid sternal vertical scar is well healed and without any signs of infection.  Heart:  Regular rate and rhythm, S1 and S2 normal, no murmur, rubs   Abdomen:  Soft, non-tender, bowel sounds active all four quadrants,  no masses, no organomegaly   Extremities:  No edema;    Pulses:  2+ and symmetric all extremities   Skin:  Skin texture, turgor normal, no rashes or lesions; hypopigmented macular lesions to both shins noted  Neuro: Nonfocal  Lymph nodes:  Cervical, supraclavicular, and axillary nodes normal      Lab results: Sodium (NA)                               133        l      135-145          mEq/L  Potassium (K)  5.3        h      3.5-5.1          mEq/L  Chloride                                 99                96-112           mEq/L  CO2                                      13         l      19-32            mEq/L  Glucose                                  161        h      70-99            mg/dL  BUN                                      57         h      6-23             mg/dL  Creatinine                               2.56       h      0.50-1.35        mg/dL  GFR, Est Non African American            26         l      >60              mL/min  GFR, Est African American                31         l      >60              mL/min    Oversized comment, see footnote  1  Calcium                                  8.7               8.4-10.5         Mg/dL  WBC                                      8.3               4.0-10.5         K/uL  RBC  4.75              4.22-5.81        MIL/uL  Hemoglobin (HGB)                         18.3       h      13.0-17.0        g/dL  Hematocrit (HCT)                         49.5              39.0-52.0        %  MCV                                      104.2      h      78.0-100.0       fL  MCH -                                    38.5       h      26.0-34.0        pg  MCHC                                     37.0       h      30.0-36.0        g/dL  RDW                                      13.0              11.5-15.5        %  Platelet Count (PLT)                     156               150-400          K/uL  Neutrophils, %                           79         h      43-77            %  Lymphocytes, %                           14                12-46            %  Monocytes, %                             6                 3-12             %  Eosinophils, %  1                 0-5              %  Basophils, %                              0                 0-1              %  Neutrophils, Absolute                    6.5               1.7-7.7          K/uL  Lymphocytes, Absolute                    1.2               0.7-4.0          K/uL  Monocytes, Absolute                      0.5               0.1-1.0          K/uL  Eosinophils, Absolute                    0.1               0.0-0.7          K/uL  Basophils, Absolute                      0.0               0.0-0.1          K/uL  PTT(a-Partial Thromboplastn Time)        36                24-37            Seconds  Protime ( Prothrombin Time)              22.3       h      11.6-15.2        seconds  INR                                      1.92       h      0.00-1.49  Troponin I, POC                          0.04              0.00-0.08        Ng/mL  Alcohol                                  111        h      0-11             Mg/dL  Amphetamins  SEE NOTE.         NDT    NONE DETECTED  Barbiturates                             SEE NOTE.         NDT    Oversized comment, see footnote  1  Benzodiazepines                          SEE NOTE.         NDT    NONE DETECTED  Cocaine                                  POSITIVE   a      NDT  Opiates                                  POSITIVE   a      NDT  Tetrahydrocannabinol                     SEE NOTE.         NDT  CD4                                      630               [phone]         cmm T-Helper %                               35                33-55            %   Imaging results:  CXR (portable): No evidence of active pulmonary disease.  EKG: Changes since previous EKG taken in May, 2011. Sinus tachycardia with left axis deviation; Q waves in infeior leads; inverted T waves laterally.   Assessment & Plan by Problem: CHIEF COMPLAINT: Chest pain x .[name].. minutes/hours/days  HISTORY OF PRESENT ILLNESS:  Site - Onset - Character - Radiation - Alleviating factors - Time course  - Exacerbating factors - Severity - Nausea -, Vomiting -, SOB -, Sweating - Similar CP before -  ER Tx given -  PND -, DOE -, Palpitations - Fever/chills - , Cough - , Ankle swelling - Claudication - , Headache - , Blackouts - Recent tavel - Blurred vision -, Sore throat - Abdominal pain - , Black/bloody stool - Dysuria -   ASSESSMENT: 1. Chest pain is likely multifactorial and due to *Cocaine and alcohol use  And/or * cardiac ischemia versus * GERD versus **PE -->Modified Geneva score: 5 that indicates intermediate probability of 28%. However, we do have an explanation for the patient's symptoms. Therefore, suspicion for PE is very low.  - cardiac enzymes x 2 more q 6 hr - EKG once on a floor and in AM - ASA - Hold coreg in a setting of  cocaine intoxication - O2 by [place] to keep SpO2 greater than 92t% -LFT's, TSH - CBC, BMP in AM - Fasting lipids - Morphine  2 mg IV q 2-4 hr PRN chest pain - Tylenol 325 mg PO q 4-6 hr PRN pain - NTG 0.4 mg SL PRN for CP - Cardiology consult if rules in -SW consult for cocaine and ETOH use counseling.   2. AKI, prerenal vs ATN.  Patient reports a one day history of acute diarrhea and recent alcohol use that may explain pre-renal etiology. The patient is also with HIV but last CD4# is in 72's. Therefore, FSGS is unlikely. Also, will await for CK  results->to evaluate for a possible cocaine-induced rhabdomyolyses.  - Urine sodium - Urine spot protein -Urine eosinophils - Daily I/O's -  Careful repletion with IVF with LVEF of 40%. - Hold metformin and Vasotec - Renal diet  - No indications for HD for now.  3. Acute diarrhea with hematochezia for 1 day. Unclear etiology. No recent travels or using well water. No exposure to sick persons. -stool lactoferrin -FOBT x1  4.Systolic CHF with LVEF of 40% per 2-D ECHO in 2011. No signs or symptoms of fluid.  5. Elevated INR. Patient is not on anticoagulation therapy.  Patient is admitted  with alcohol intoxication -therfore, theere is a question of an alcohol-induced liver disease.  In addition, there is a questionable history of Hepatitis C infection in the past. However, E-chart records were reviewed. [place] Hep C antibody negative as of may, 2004. - LFT's - Lipase -repeat INR in 12 hrs and in am - Acute hepatitis panel -no heparin products for DVT prophylaxis.  6. Elevated Hemoglobin with an elevated MCV. The cause is unclear. -will check Vit B12, Folate levels  7. DM, type 2.  - No recent HgbA1C results. Will order it today. - Hold metformin - SSI, sensitive   8.  ETOH intoxication. -CIWA Protocol -Ativan IV  q 8 hrs PRN for agitation -Oral Thiamine, Folate and Vit B12 - Counseling on alcohol use cessation.  9 DVT PPx: SCD's  .  Milana Obey, M.D. (PGY 3):   ____________________________________    Date/ Time:      ____________________________________     I have seen and examined the patient. I reviewed the resident/fellow note and agree with the findings and plan of care as documented. My additions and revisions are included.   Signature:  ____________________________________________     Internal Medicine Teaching Service Attending    Date:    ____________________________________________

## 2011-07-01 ENCOUNTER — Inpatient Hospital Stay (HOSPITAL_COMMUNITY): Payer: PRIVATE HEALTH INSURANCE

## 2011-07-01 ENCOUNTER — Other Ambulatory Visit: Payer: Self-pay | Admitting: Internal Medicine

## 2011-07-01 DIAGNOSIS — F1010 Alcohol abuse, uncomplicated: Secondary | ICD-10-CM

## 2011-07-01 DIAGNOSIS — R079 Chest pain, unspecified: Secondary | ICD-10-CM

## 2011-07-01 LAB — LIPID PANEL
Cholesterol: 88 mg/dL (ref 0–200)
HDL: 23 mg/dL — ABNORMAL LOW (ref >39)

## 2011-07-01 LAB — APTT: aPTT: 32 seconds (ref 24–37)

## 2011-07-01 LAB — URINALYSIS, ROUTINE W REFLEX MICROSCOPIC
Glucose, UA: NEGATIVE mg/dL
Ketones, ur: NEGATIVE mg/dL
Leukocytes, UA: NEGATIVE
Nitrite: NEGATIVE
Protein, ur: 30 mg/dL — AB
Urobilinogen, UA: 1 mg/dL (ref 0.0–1.0)

## 2011-07-01 LAB — GLUCOSE, CAPILLARY
Glucose-Capillary: 237 mg/dL — ABNORMAL HIGH (ref 70–99)
Glucose-Capillary: 139 mg/dL — ABNORMAL HIGH (ref 70–99)

## 2011-07-01 LAB — CBC
HCT: 42.6 % (ref 39.0–52.0)
Hemoglobin: 15.4 g/dL (ref 13.0–17.0)
MCV: 103.4 fL — ABNORMAL HIGH (ref 78.0–100.0)
RBC: 4.12 MIL/uL — ABNORMAL LOW (ref 4.22–5.81)
RDW: 12.8 % (ref 11.5–15.5)
WBC: 5.6 10*3/uL (ref 4.0–10.5)

## 2011-07-01 LAB — PROTIME-INR
INR: 1.24 (ref 0.00–1.49)
Prothrombin Time: 15.9 seconds — ABNORMAL HIGH (ref 11.6–15.2)

## 2011-07-01 LAB — BASIC METABOLIC PANEL
BUN: 50 mg/dL — ABNORMAL HIGH (ref 6–23)
Chloride: 103 mEq/L (ref 96–112)
GFR calc Af Amer: 52 mL/min — ABNORMAL LOW (ref >60)
GFR calc non Af Amer: 43 mL/min — ABNORMAL LOW (ref >60)
Potassium: 4.8 mEq/L (ref 3.5–5.1)
Sodium: 133 mEq/L — ABNORMAL LOW (ref 135–145)

## 2011-07-01 LAB — HEPATITIS PANEL, ACUTE
HCV Ab: NEGATIVE
Hep A IgM: NEGATIVE
Hep B C IgM: NEGATIVE

## 2011-07-01 LAB — PROTEIN, URINE, RANDOM: Total Protein, Urine: 55.6 mg/dL

## 2011-07-01 LAB — URINE MICROSCOPIC-ADD ON

## 2011-07-02 ENCOUNTER — Inpatient Hospital Stay (HOSPITAL_COMMUNITY): Payer: PRIVATE HEALTH INSURANCE

## 2011-07-02 LAB — PROTIME-INR
INR: 1.08 (ref 0.00–1.49)
Prothrombin Time: 14.2 seconds (ref 11.6–15.2)

## 2011-07-02 LAB — BASIC METABOLIC PANEL
CO2: 16 mEq/L — ABNORMAL LOW (ref 19–32)
Chloride: 101 mEq/L (ref 96–112)
Sodium: 135 mEq/L (ref 135–145)

## 2011-07-02 LAB — GLUCOSE, CAPILLARY
Glucose-Capillary: 142 mg/dL — ABNORMAL HIGH (ref 70–99)
Glucose-Capillary: 139 mg/dL — ABNORMAL HIGH (ref 70–99)
Glucose-Capillary: 147 mg/dL — ABNORMAL HIGH (ref 70–99)

## 2011-07-02 LAB — HEPATIC FUNCTION PANEL
AST: 400 U/L — ABNORMAL HIGH (ref 0–37)
Albumin: 3.4 g/dL — ABNORMAL LOW (ref 3.5–5.2)
Total Protein: 8.1 g/dL (ref 6.0–8.3)

## 2011-07-03 LAB — HEPATIC FUNCTION PANEL
AST: 189 U/L — ABNORMAL HIGH (ref 0–37)
Albumin: 2.9 g/dL — ABNORMAL LOW (ref 3.5–5.2)
Bilirubin, Direct: 0.3 mg/dL (ref 0.0–0.3)

## 2011-07-03 LAB — BASIC METABOLIC PANEL
Calcium: 9.7 mg/dL (ref 8.4–10.5)
GFR calc Af Amer: >60 mL/min (ref >60)
GFR calc non Af Amer: >60 mL/min (ref >60)
Potassium: 4.6 mEq/L (ref 3.5–5.1)
Sodium: 136 mEq/L (ref 135–145)

## 2011-07-03 LAB — GLUCOSE, CAPILLARY
Glucose-Capillary: 173 mg/dL — ABNORMAL HIGH (ref 70–99)
Glucose-Capillary: 166 mg/dL — ABNORMAL HIGH (ref 70–99)
Glucose-Capillary: 157 mg/dL — ABNORMAL HIGH (ref 70–99)

## 2011-07-03 LAB — PROTIME-INR: Prothrombin Time: 14 seconds (ref 11.6–15.2)

## 2011-07-04 DIAGNOSIS — R079 Chest pain, unspecified: Secondary | ICD-10-CM

## 2011-07-04 DIAGNOSIS — F1010 Alcohol abuse, uncomplicated: Secondary | ICD-10-CM

## 2011-07-04 LAB — BASIC METABOLIC PANEL
BUN: 24 mg/dL — ABNORMAL HIGH (ref 6–23)
Calcium: 9.6 mg/dL (ref 8.4–10.5)
Chloride: 108 mEq/L (ref 96–112)
Creatinine, Ser: 1.06 mg/dL (ref 0.50–1.35)
GFR calc Af Amer: >60 mL/min (ref >60)
GFR calc non Af Amer: >60 mL/min (ref >60)

## 2011-07-04 LAB — HEPATIC FUNCTION PANEL
ALT: 647 U/L — ABNORMAL HIGH (ref 0–53)
AST: 96 U/L — ABNORMAL HIGH (ref 0–37)
Alkaline Phosphatase: 86 U/L (ref 39–117)
Bilirubin, Direct: 0.3 mg/dL (ref 0.0–0.3)
Indirect Bilirubin: 0.7 mg/dL (ref 0.3–0.9)
Total Bilirubin: 1 mg/dL (ref 0.3–1.2)

## 2011-07-04 LAB — GLUCOSE, CAPILLARY
Glucose-Capillary: 125 mg/dL — ABNORMAL HIGH (ref 70–99)
Glucose-Capillary: 129 mg/dL — ABNORMAL HIGH (ref 70–99)

## 2011-07-04 LAB — CBC
HCT: 43.3 % (ref 39.0–52.0)
MCH: 37.5 pg — ABNORMAL HIGH (ref 26.0–34.0)
MCHC: 35.8 g/dL (ref 30.0–36.0)
RDW: 12.8 % (ref 11.5–15.5)

## 2011-07-04 LAB — PROTIME-INR: INR: 1.1 (ref 0.00–1.49)

## 2011-07-05 ENCOUNTER — Inpatient Hospital Stay (HOSPITAL_COMMUNITY): Payer: PRIVATE HEALTH INSURANCE

## 2011-07-05 ENCOUNTER — Ambulatory Visit: Payer: PRIVATE HEALTH INSURANCE | Admitting: Internal Medicine

## 2011-07-05 DIAGNOSIS — R079 Chest pain, unspecified: Secondary | ICD-10-CM

## 2011-07-05 DIAGNOSIS — F1010 Alcohol abuse, uncomplicated: Secondary | ICD-10-CM

## 2011-07-05 LAB — PROTIME-INR
INR: 1.08 (ref 0.00–1.49)
Prothrombin Time: 14.2 seconds (ref 11.6–15.2)

## 2011-07-05 LAB — GLUCOSE, CAPILLARY
Glucose-Capillary: 131 mg/dL — ABNORMAL HIGH (ref 70–99)
Glucose-Capillary: 112 mg/dL — ABNORMAL HIGH (ref 70–99)

## 2011-07-05 LAB — HEPATIC FUNCTION PANEL
AST: 74 U/L — ABNORMAL HIGH (ref 0–37)
Albumin: 3 g/dL — ABNORMAL LOW (ref 3.5–5.2)
Total Protein: 7.7 g/dL (ref 6.0–8.3)

## 2011-07-05 LAB — CBC
MCH: 37.2 pg — ABNORMAL HIGH (ref 26.0–34.0)
MCHC: 35.6 g/dL (ref 30.0–36.0)
MCV: 104.2 fL — ABNORMAL HIGH (ref 78.0–100.0)
Platelets: 86 10*3/uL — ABNORMAL LOW (ref 150–400)
RBC: 4.01 MIL/uL — ABNORMAL LOW (ref 4.22–5.81)

## 2011-07-06 DIAGNOSIS — R079 Chest pain, unspecified: Secondary | ICD-10-CM

## 2011-07-06 DIAGNOSIS — F1010 Alcohol abuse, uncomplicated: Secondary | ICD-10-CM

## 2011-07-06 LAB — GLUCOSE, CAPILLARY: Glucose-Capillary: 132 mg/dL — ABNORMAL HIGH (ref 70–99)

## 2011-07-06 LAB — HEPATIC FUNCTION PANEL
AST: 57 U/L — ABNORMAL HIGH (ref 0–37)
Albumin: 2.7 g/dL — ABNORMAL LOW (ref 3.5–5.2)
Alkaline Phosphatase: 78 U/L (ref 39–117)
Total Bilirubin: 1 mg/dL (ref 0.3–1.2)

## 2011-07-06 LAB — CBC
Hemoglobin: 13.6 g/dL (ref 13.0–17.0)
MCH: 37.4 pg — ABNORMAL HIGH (ref 26.0–34.0)
MCHC: 35.6 g/dL (ref 30.0–36.0)
MCV: 104.9 fL — ABNORMAL HIGH (ref 78.0–100.0)
Platelets: 94 10*3/uL — ABNORMAL LOW (ref 150–400)
RBC: 3.64 MIL/uL — ABNORMAL LOW (ref 4.22–5.81)

## 2011-07-07 LAB — GLUCOSE, CAPILLARY
Glucose-Capillary: 137 mg/dL — ABNORMAL HIGH (ref 70–99)
Glucose-Capillary: 146 mg/dL — ABNORMAL HIGH (ref 70–99)

## 2011-07-07 LAB — BASIC METABOLIC PANEL
CO2: 22 mEq/L (ref 19–32)
Chloride: 108 mEq/L (ref 96–112)
GFR calc non Af Amer: >60 mL/min (ref >60)
Glucose, Bld: 109 mg/dL — ABNORMAL HIGH (ref 70–99)
Potassium: 4.3 mEq/L (ref 3.5–5.1)
Sodium: 138 mEq/L (ref 135–145)

## 2011-07-07 LAB — CBC
Hemoglobin: 14.1 g/dL (ref 13.0–17.0)
RBC: 3.77 MIL/uL — ABNORMAL LOW (ref 4.22–5.81)
WBC: 6.2 10*3/uL (ref 4.0–10.5)

## 2011-07-08 DIAGNOSIS — F1010 Alcohol abuse, uncomplicated: Secondary | ICD-10-CM

## 2011-07-08 DIAGNOSIS — R079 Chest pain, unspecified: Secondary | ICD-10-CM

## 2011-07-08 LAB — HEPATIC FUNCTION PANEL
AST: 66 U/L — ABNORMAL HIGH (ref 0–37)
Bilirubin, Direct: 0.3 mg/dL (ref 0.0–0.3)
Total Bilirubin: 1 mg/dL (ref 0.3–1.2)

## 2011-07-08 LAB — GLUCOSE, CAPILLARY: Glucose-Capillary: 164 mg/dL — ABNORMAL HIGH (ref 70–99)

## 2011-07-08 LAB — CBC
MCH: 37.9 pg — ABNORMAL HIGH (ref 26.0–34.0)
Platelets: 116 10*3/uL — ABNORMAL LOW (ref 150–400)
RBC: 3.38 MIL/uL — ABNORMAL LOW (ref 4.22–5.81)

## 2011-07-08 LAB — PROTIME-INR: Prothrombin Time: 14.9 seconds (ref 11.6–15.2)

## 2011-07-09 LAB — PROTIME-INR
INR: 0.98 (ref 0.00–1.49)
Prothrombin Time: 13.2 seconds (ref 11.6–15.2)

## 2011-07-09 LAB — GLUCOSE, CAPILLARY
Glucose-Capillary: 105 mg/dL — ABNORMAL HIGH (ref 70–99)
Glucose-Capillary: 171 mg/dL — ABNORMAL HIGH (ref 70–99)
Glucose-Capillary: 119 mg/dL — ABNORMAL HIGH (ref 70–99)
Glucose-Capillary: 211 mg/dL — ABNORMAL HIGH (ref 70–99)
Glucose-Capillary: 148 mg/dL — ABNORMAL HIGH (ref 70–99)

## 2011-07-09 LAB — HEPATIC FUNCTION PANEL
AST: 76 U/L — ABNORMAL HIGH (ref 0–37)
Albumin: 2.5 g/dL — ABNORMAL LOW (ref 3.5–5.2)

## 2011-07-10 LAB — PROTIME-INR
INR: 0.97 (ref 0.00–1.49)
Prothrombin Time: 13.1 seconds (ref 11.6–15.2)

## 2011-07-10 LAB — HEPATIC FUNCTION PANEL
ALT: 177 U/L — ABNORMAL HIGH (ref 0–53)
AST: 83 U/L — ABNORMAL HIGH (ref 0–37)
Albumin: 2.4 g/dL — ABNORMAL LOW (ref 3.5–5.2)
Bilirubin, Direct: 0.1 mg/dL (ref 0.0–0.3)
Total Bilirubin: 0.5 mg/dL (ref 0.3–1.2)

## 2011-07-17 NOTE — Discharge Summary (Signed)
NAME:  Jacob [name], Jacob [name] NO.:  [id number]  MEDICAL RECORD NO.:  [id number]  LOCATION:  5528                         FACILITY:  [facility]  PHYSICIAN:  Oval Linsey, MD     DATE OF BIRTH:  [birth year]  DATE OF ADMISSION:  06/30/2011 DATE OF DISCHARGE:  07/10/2011                              DISCHARGE SUMMARY   DISCHARGE DIAGNOSES: 1. Alcohol withdrawal. 2. Markedly elevated transaminases. 3. Hypertension and tachycardia. 4. Thrombocytopenia. 5. Left hand/wrist pain. 6. HIV  DISCHARGE MEDICATIONS: 1. Famotidine 20 mg p.o. daily. 2. Folic acid 1 mg p.o. daily. 3. Multivitamin 1 tablet p.o. daily. 4. Thiamine 100 mg p.o. daily. 5. Allopurinol 300 mg p.o. daily. 6. Aspirin 325 mg p.o. daily. 7. Combivir 150/300 p.o. b.i.d. 8. Carvedilol 25 mg p.o. b.i.d. 9. Enalapril 5 mg p.o. b.i.d. 10.Atorvastatin 40 mg p.o. daily. 11.Reyataz 200 mg caps 2 caps p.o. daily.  DISPOSITION AND FOLLOWUP:  Jacob [name] was discharged in improved and stable condition with follow up appointment with Dr. [name] of  Infectious Diseases within 1 week.  He was to call and make an  appointment.  In addition, he is scheduled for outpatient physical  therapy and discharged with a single-point cane per physical  therapy recommendation.  PROCEDURES PERFORMED:  1. June 30, 2011, chest x-ray showed no evidence of acute pulmonary      disease.  2. July 01, 2011, chest x-ray showed low lung volumes, otherwise, no     acute cardiopulmonary abnormality.  3. July 02, 2011, ultrasound of abdomen which was limited by      overlying bowel gas, borderline increased echogenicity of the kidneys      which can be seen with medical renal disease and he was status post      cholecystectomy.  4. July 02, 2011, duplex ultrasound of liver with findings of     hepatopetal flow in the portal veins and hepatofugal flow in the     hepatic veins.  No portal vein thrombosis was seen.  5.  July 05, 2011, x-ray of right shoulder which showed no dislocation.  6. July 05, 2011, x-ray of left elbow which showed no evidence of      fracture or dislocation, no subluxation was seen.  7. July 05, 2011, x-ray of left hand showed no evidence of fracture      or dislocation.  CONSULTATIONS:  None.  BRIEF ADMITTING HISTORY:  Jacob [name] is a 60 year old man who presented to the ED with complaints of left-sided chest pain that started at midnight on the day of admission while drinking beer.  He reports the pain was intermittent and lasted approximately 30 minutes, no aggravating symptoms.  Pain did not radiate and was only relieved after administration of sublingual nitroglycerin by the EMS paramedics. He was unable to describe the quality of the chest pain.  He denies shortness of breath, pleurisy, fever, chills, nausea, vomiting.  He reports drenching sweats at the onset of the chest pain, for which he  took a shower.  He denied chest pain similar to this in the past and  reports having open heart surgery in 2005.  He states that he also used cocaine 3  days ago without any chest pain at that time.  He reports no abdominal pain, distention, orthopnea, PND or leg swelling.  He reports a 1-day history of diarrhea, described as loose stools without mucus and 5-6 episodes, associated one episode of dark bloody streaks  in the stool.  No recent use of antibiotics, travel, drinking well water,  or sick contacts.  ADMISSION LABORATORY DATA:  Sodium 133, potassium 5.3, chloride 99, bicarbonate 13, glucose 161, BUN 57, creatinine 2.56, GFR 31, calcium 8.7.  White blood cell count 8.3, hemoglobin 18.3, hematocrit 49.5, platelets 156, MCV 104.2, 79% neutrophils.  PTT 36, PT 22.3,  INR 1.92.  Alcohol 111.  Troponin 0.04.  Urine drug screen positive  for cocaine and opiates.  CD4 30, T-helper 35%.  EKG: sinus tachycardia, left axis deviation, Q-waves in inferior  leads and  inverted T-waves laterally.  HOSPITAL COURSE: 1. Jacob [name] was admitted for assessment of his chest pain which was     likely multifactorial due to his cocaine and alcohol use.  Cardiac     ischemia versus gastroesophageal reflux disease were high on our     differential.  He had a modified Geneva score of 5 that indicated     intermediate probability of PE; however, we did not have an     explanation for the patient's symptoms; therefore, the suspicion     for pulmonary embolism was low.  Cardiac enzymes were cycled and     remained negative.  He was given aspirin.  We held his Coreg at     initially due to cocaine intoxication, treated him with morphine 2     mg IV p.r.n. for pain and had social work consult for the cocaine     and alcohol use counseling.  By the time of discharge, Jacob [name]     was free of chest pain without a clear etiology.  2. Acute kidney insufficiency prerenal versus ATN.  Jacob [name]     reported a 1-day history of acute diarrhea and recent alcohol use that     may have explained his prerenal etiology.  He is also HIV positive     with a CD4 in the 600s; therefore, FSGS was unlikely.  His CK     results were without possible cocaine-induced rhabdomyolysis.  3. Elevated transaminases.  Jacob [name] admitted EtOH use therefore,      there was a question of alcohol-induced liver disease.  In addition,      he had a questionable history of hepatitis C infection in the past;      however, chart reviewed revealed his last hep C antibody was negative     in May 2004.  He was found to have markedly elevated transaminases (AST     2644, ALT 4454) total protein 9, albumin 4, indirect bilirubin 0.5,      alkaline phosphatase 113, total bilirubin 0.7, direct bilirubin 0.2.       With this degree of transaminitis the differential was small.  Hepatic     ultrasound ruled out portal vein thrombosis.  Hepatic viral studies     were also obtained.  Because of his concomitant  Tylenol #3, Percocet,      and Tylenol use a concern for tylenol overdose was raised and he was      empirically treated with mucomyst.  His transaminases trended towards      normal and his synthetic function rebounded.  4. Alcohol withdrawal.  Jacob [name] hospital  stay was complicated by     alcohol withdrawal, most notably symptoms of agitation and     delirium.  He was managed on a CIWA protocol with p.r.n. Ativan.     CIWA protocol was needed until hospital day 8, thereafter he     became more lucid, alert, and oriented to self and place     although he had lost several days of time in his memory.  His     symptoms continued to improve whereby he was able to recall family     members and friends, consume a full diet, take all his medications     orally, and showed good judgment and insight.  5. Thrombocytopenia.  Stabilized by the time of discharge and was     likely secondary to his liver compromise.  6. Left hand and wrist pain.  He likely sustained a sprain during     combative agitation during the night of July 04, 2011, when he needed      to be restrained.  We managed his pain initially with Dilaudid and     then converted to Celebrex 200 mg daily since he reported     that he was intolerant to ibuprofen.  By the time of discharge,     tenderness and edema had resolved.  7. Hypertension and tachycardia.  Jacob [name] blood pressure was controlled with     Coreg.  Tachycardia was likely secondary to pain as it normalized     once he received analgesics and therapy for his alcohol withdraw.  By hospital day 11, the patient was alert, oriented, hemodynamically stable, and  requesting to be discharged from hospital. He agreed to appropriate follow up  with Dr. [name] and physical therapy.  DISCHARGE LABORATORY DATA AND VITAL SIGNS:  Temperature 98.2, pulse 57,  respirations 16, blood pressure 158/76, O2 saturation 97% on room air.   Total bilirubin 0.5, direct  bilirubin 0.1, indirect bilirubin 0.4, alkaline  phosphatase 75, AST 83, ALT 177, total protein 6.8, albumin 2.4.  Prothrombin  time 13.3, INR 0.97.   ______________________________ Dorian Heckle, MD   ______________________________ Oval Linsey, MD   KS/MEDQ  D:  07/11/2011  T:  07/11/2011  Job:  [id number]  cc:   [name], M.D.  Electronically Signed by Dorian Heckle MD on 07/14/2011 04:45:55 PM Electronically Signed by Oval Linsey  on 07/17/2011 12:53:48 PM

## 2011-07-19 ENCOUNTER — Telehealth: Payer: Self-pay | Admitting: *Deleted

## 2011-07-19 NOTE — Telephone Encounter (Signed)
Pt called stating his CBG's have been elevated. Last night 182 - 205 Today 145 - 225. Pt not taking diabetic meds, ( states metformin was stopped ) he was concerned with cbg's in 200's.  Pt scheduled for OV 10/4.  His PCP is Dr [name] who he will also see 10/4.

## 2011-07-20 LAB — T-HELPER CELL (CD4) - (RCID CLINIC ONLY)
CD4 % Helper T Cell: 36
CD4 T Cell Abs: 890

## 2011-07-25 ENCOUNTER — Telehealth: Payer: Self-pay | Admitting: Cardiology

## 2011-07-25 NOTE — Telephone Encounter (Signed)
Walk In pt Form " pt Dropped off [employer]" Paper for Completion sent to [facility]  07/25/11/km

## 2011-07-26 LAB — T-HELPER CELLS (CD4) COUNT (NOT AT ARMC): CD4 % Helper T Cell: 34

## 2011-07-28 ENCOUNTER — Ambulatory Visit (INDEPENDENT_AMBULATORY_CARE_PROVIDER_SITE_OTHER): Payer: PRIVATE HEALTH INSURANCE | Admitting: Internal Medicine

## 2011-07-28 ENCOUNTER — Encounter: Payer: Self-pay | Admitting: Internal Medicine

## 2011-07-28 VITALS — BP 165/91 | HR 58 | Temp 98.3°F | Ht 74.0 in | Wt 169.2 lb

## 2011-07-28 DIAGNOSIS — B20 Human immunodeficiency virus [HIV] disease: Secondary | ICD-10-CM

## 2011-07-28 DIAGNOSIS — E119 Type 2 diabetes mellitus without complications: Secondary | ICD-10-CM

## 2011-07-28 DIAGNOSIS — F1910 Other psychoactive substance abuse, uncomplicated: Secondary | ICD-10-CM

## 2011-07-28 DIAGNOSIS — Z23 Encounter for immunization: Secondary | ICD-10-CM

## 2011-07-28 LAB — POCT GLYCOSYLATED HEMOGLOBIN (HGB A1C): Hemoglobin A1C: 5.7

## 2011-07-28 NOTE — Progress Notes (Signed)
  Subjective:    [name], male    DOB: 1950/11/05, 60 y.o.   MRN: [id number]  HPI Ray is in for his hospital followup visit. He was hospitalized about one month ago with acute intoxication alcohol level of 111 and a urine drug screen positive for cocaine and opiates. He states that a friend from [place] he had been staying with him and that he did smoke crack cocaine and was drinking to excess. He went through alcohol withdrawal while hospitalized and does not remember much of that hospitalization. He has had some alcohol since discharge but states that he has greatly reduced his intake. His friend from [place] his left and he has not used any other street drugs or any of the narcotic pain medications that he received from Dr. Sharol Given. He states that he has not taken any of the tramadol that he had received here. He denies missing any doses of his HIV medications.    Review of Systems     Objective:   Physical Exam  Constitutional: He appears well-developed and well-nourished. No distress.  HENT:  Mouth/Throat: Oropharynx is clear and moist. No oropharyngeal exudate.  Cardiovascular: Normal heart sounds.   No murmur heard. Pulmonary/Chest: Breath sounds normal. He has no wheezes. He has no rales.  Psychiatric: He has a normal mood and affect.       He is appropriately remorseful about the events leading up to his hospitalization.          Assessment & Plan:

## 2011-07-28 NOTE — Patient Instructions (Signed)
Please, make sure to receive your flu shot ASAP--flu season is here!!!! Our office will contact you with a referral to an eye doctor. Please, do not use alcohol or any street drugs! Please, call our office and speak with Ms. Tessiatore if you wish to attend smoking cessation classes. Please, follow up in 3 months or sooner

## 2011-07-28 NOTE — Progress Notes (Signed)
  Subjective:    [name], male    DOB: 1951-06-13, 60 y.o.   MRN: [id number]  HPI Hospital follow up. Please, refer to Dr. Donneta Romberg D/C summary. Patient reports felling much better. Denies any concerns.     Review of Systems  Constitutional: Negative.   Eyes: Negative for visual disturbance.  Respiratory: Negative for apnea, cough, chest tightness, shortness of breath and wheezing.   Cardiovascular: Negative for chest pain, palpitations and leg swelling.  Gastrointestinal: Negative for abdominal distention.  Genitourinary: Negative for dysuria, frequency, hematuria and discharge.  Neurological: Negative for dizziness and weakness.  Psychiatric/Behavioral: Negative for hallucinations, behavioral problems, confusion, dysphoric mood and agitation.       Objective:   Physical Exam   Vitals: reviewed. General: alert, well-developed, and cooperative to examination.  Head: normocephalic and atraumatic.  Eyes: vision grossly intact, pupils equal, pupils round, pupils reactive to light, no injection and anicteric.  Mouth: pharynx pink and moist, no erythema, and no exudates.  Neck: supple, full ROM, no thyromegaly, no JVD, and no carotid bruits.  Lungs: normal respiratory effort, no accessory muscle use, normal breath sounds, no crackles, and no wheezes. Heart: normal rate, regular rhythm, no murmur, no gallop, and no rub.  Abdomen: soft, non-tender, normal bowel sounds, no distention, no guarding, no rebound tenderness, no hepatomegaly, and no splenomegaly.  Msk: no joint swelling, no joint warmth, and no redness over joints.  Pulses: 2+ DP/PT pulses bilaterally Extremities: No cyanosis, clubbing, edema Neurologic: alert & oriented X3, cranial nerves II-XII intact, strength normal in all extremities, sensation intact to light touch, and gait normal.  Skin: turgor normal and no rashes.  Psych: Oriented X3, memory intact for recent and remote, normally interactive, good  eye contact, not anxious appearing, and not depressed appearing.        Assessment & Plan:  1. Transaminates 2/2 ETOH abuse. Patient reports being sober since hospital admission. -LFT's today 2. CHF, systolic, chronic. Stable -CHF clinic -med records faxed to CHF clinic per patient's request after obtained authorization from the patient. -B-met today 3. HTN, controlled -no change in therapy 4. ? DM, type 2.Patient is off hypoglycemic meds due to recent transaminates. -HgbA1C today -will await C-met results before decision can be made on a choice of therapy. -meter reading reviewed and CBG's run between 114 and 187.

## 2011-07-28 NOTE — Assessment & Plan Note (Signed)
He states that his narcotics were taken away from him while he was in the hospital and he does not have any more at home. He states that he has also gotten rid of his tramadol and does not believe he will have any trouble avoiding excess alcohol or street drugs going forward.

## 2011-07-28 NOTE — Assessment & Plan Note (Signed)
Fortunately his adherence remains excellent and his infection is under very good control with a CD4 count of 630 and a viral load remains undetectable at less than 20.

## 2011-07-29 LAB — COMPLETE METABOLIC PANEL WITH GFR
ALT: 22 U/L (ref 0–53)
AST: 22 U/L (ref 0–37)
Alkaline Phosphatase: 74 U/L (ref 39–117)
CO2: 24 mEq/L (ref 19–32)
Creat: 1.6 mg/dL — ABNORMAL HIGH (ref 0.50–1.35)
Sodium: 137 mEq/L (ref 135–145)
Total Bilirubin: 1.2 mg/dL (ref 0.3–1.2)
Total Protein: 7.3 g/dL (ref 6.0–8.3)

## 2011-08-02 LAB — T-HELPER CELL (CD4) - (RCID CLINIC ONLY)
CD4 % Helper T Cell: 32 — ABNORMAL LOW
CD4 T Cell Abs: 810

## 2011-08-08 LAB — T-HELPER CELL (CD4) - (RCID CLINIC ONLY)
CD4 % Helper T Cell: 30 — ABNORMAL LOW
CD4 T Cell Abs: 870

## 2011-09-20 ENCOUNTER — Other Ambulatory Visit: Payer: Self-pay | Admitting: Internal Medicine

## 2011-09-20 DIAGNOSIS — B20 Human immunodeficiency virus [HIV] disease: Secondary | ICD-10-CM

## 2011-09-26 ENCOUNTER — Telehealth: Payer: Self-pay | Admitting: Cardiology

## 2011-09-26 NOTE — Telephone Encounter (Signed)
FU Call: Pt caretaker calling wanting to get copy of pt lab results faxed [phone]) to him or given over the phone. Caretaker trying to get lab results by the end of today.  Please return call to discuss further if necessary.

## 2011-09-26 NOTE — Telephone Encounter (Signed)
Spoke with pt caregiver, lab results from 07-28-11 given Fredia Beets

## 2011-09-27 ENCOUNTER — Encounter: Payer: Self-pay | Admitting: Internal Medicine

## 2011-09-27 ENCOUNTER — Other Ambulatory Visit: Payer: Self-pay | Admitting: Infectious Diseases

## 2011-09-27 ENCOUNTER — Other Ambulatory Visit: Payer: PRIVATE HEALTH INSURANCE

## 2011-09-27 ENCOUNTER — Ambulatory Visit (INDEPENDENT_AMBULATORY_CARE_PROVIDER_SITE_OTHER): Payer: PRIVATE HEALTH INSURANCE | Admitting: Internal Medicine

## 2011-09-27 DIAGNOSIS — B20 Human immunodeficiency virus [HIV] disease: Secondary | ICD-10-CM

## 2011-09-27 DIAGNOSIS — M21619 Bunion of unspecified foot: Secondary | ICD-10-CM

## 2011-09-27 DIAGNOSIS — M21611 Bunion of right foot: Secondary | ICD-10-CM

## 2011-09-27 DIAGNOSIS — M21612 Bunion of left foot: Secondary | ICD-10-CM

## 2011-09-27 DIAGNOSIS — L84 Corns and callosities: Secondary | ICD-10-CM

## 2011-09-27 LAB — COMPLETE METABOLIC PANEL WITH GFR
AST: 24 U/L (ref 0–37)
Albumin: 3.6 g/dL (ref 3.5–5.2)
Alkaline Phosphatase: 84 U/L (ref 39–117)
BUN: 26 mg/dL — ABNORMAL HIGH (ref 6–23)
Potassium: 4.2 mEq/L (ref 3.5–5.3)
Sodium: 138 mEq/L (ref 135–145)
Total Protein: 7.2 g/dL (ref 6.0–8.3)

## 2011-09-27 LAB — LIPID PANEL
HDL: 36 mg/dL — ABNORMAL LOW (ref >39)
Total CHOL/HDL Ratio: 3 Ratio

## 2011-09-27 LAB — CBC
MCHC: 35.9 g/dL (ref 30.0–36.0)
Platelets: 151 10*3/uL (ref 150–400)
RDW: 12.9 % (ref 11.5–15.5)

## 2011-09-27 LAB — RPR: RPR Ser Ql: REACTIVE — AB

## 2011-09-27 LAB — RPR TITER: RPR Titer: 1:4 {titer}

## 2011-09-28 ENCOUNTER — Telehealth: Payer: Self-pay | Admitting: *Deleted

## 2011-09-28 ENCOUNTER — Encounter: Payer: Self-pay | Admitting: Internal Medicine

## 2011-09-28 LAB — T-HELPER CELL (CD4) - (RCID CLINIC ONLY): CD4 T Cell Abs: 1390 uL (ref 400–2700)

## 2011-09-28 NOTE — Progress Notes (Signed)
Subjective:   [name] male   DOB: 02-03-1951 60 y.o.   MRN: [id number]  HPI: Jacob [name] is a 60 y.o.  Man is here "because need a Rx for diabetic shoes." Reports bilateral foot pain with walking. Has a long-standing history of bunions and calluses. Patient is under the care of a podiatrist.    Past Medical History  Diagnosis Date  . Syphilis   . CAD (coronary artery disease)     s/p surgery  . DM2 (diabetes mellitus, type 2)   . HIV disease   . AR (allergic rhinitis)   . Gout   . HLD (hyperlipidemia)   . HTN (hypertension)   . Ischemic cardiomyopathy     EF 30%  . Chronic systolic heart failure   . PN (peripheral neuropathy)   . Drug abuse     hx; tested for cocaine as recently as 2/08. says he is not using drugs now - avoided defib. for this reason    Current Outpatient Prescriptions  Medication Sig Dispense Refill  . allopurinol (ZYLOPRIM) 300 MG tablet Take 1 tablet (300 mg total) by mouth daily.  30 tablet  6  . aspirin 81 MG tablet Take 81 mg by mouth daily.        [name] atorvastatin (LIPITOR) 40 MG tablet Take 1 tablet (40 mg total) by mouth daily.  30 tablet  6  . carvedilol (COREG) 25 MG tablet Take 1 tablet (25 mg total) by mouth 2 (two) times daily with a meal.  180 tablet  3  . enalapril (VASOTEC) 5 MG tablet Take 1 tablet (5 mg total) by mouth 2 (two) times daily.  180 tablet  3  . lamiVUDine-zidovudine (COMBIVIR) 150-300 MG per tablet TAKE 1 TABLET BY MOUTH    TWICE A DAY  60 tablet  5  . naftifine (NAFTIN) 1 % cream Apply topically daily.        . REYATAZ 200 MG capsule TAKE 2 CAPSULES BY MOUTH  ONCE A DAY IN THE MORNING  60 capsule  5  . nitroGLYCERIN (NITROSTAT) 0.3 MG SL tablet Place 1 tablet (0.3 mg total) under the tongue every 5 (five) minutes as needed.  90 tablet  6   No family history on file. History   Social History  . Marital Status: Single    Spouse Name: N/A    Number of Children: N/A  . Years of Education: N/A    Social History Main Topics  . Smoking status: Current Everyday Smoker -- 0.5 packs/day for 36 years    Types: Cigarettes  . Smokeless tobacco: Never Used   Comment: has a goal of 10/24/11  . Alcohol Use: Yes     drinks beer sometimes  . Drug Use: No     none since 2008   . Sexually Active: Not Currently     declined condoms   Other Topics Concern  . None   Social History Narrative   Retired.    Review of Systems: Constitutional: Denies fever, chills, diaphoresis, appetite change and fatigue.  HEENT: Denies photophobia, eye pain, redness, hearing loss, ear pain, congestion, sore throat, rhinorrhea, sneezing, mouth sores, trouble swallowing, neck pain, neck stiffness and tinnitus.   Respiratory: Denies SOB, DOE, cough, chest tightness,  and wheezing.   Cardiovascular: Denies chest pain, palpitations and leg swelling.  Gastrointestinal: Denies nausea, vomiting, abdominal pain, diarrhea, constipation, blood in stool and abdominal distention.  Genitourinary: Denies dysuria, urgency, frequency, hematuria, flank pain and  difficulty urinating.  Musculoskeletal: Denies myalgias, back pain, joint swelling, arthralgias and gait problem.  Skin: Denies pallor, rash and wound.  Neurological: Denies dizziness, seizures, syncope, weakness, light-headedness, numbness and headaches.  Hematological: Denies adenopathy. Easy bruising, personal or family bleeding history  Psychiatric/Behavioral: Denies suicidal ideation, mood changes, confusion, nervousness, sleep disturbance and agitation  Objective:  Physical Exam: Filed Vitals:   09/27/11 1527  BP: 157/78  Pulse: 63  Temp: 97.3 F (36.3 C)  TempSrc: Oral  Height: 6\' 3"  (1.905 m)  Weight: 182 lb 11.2 oz (82.872 kg)   Constitutional: Vital signs reviewed.  Patient is a well-developed and well-nourished man in no acute distress and cooperative with exam. Alert and oriented x3.  Head: Normocephalic and atraumatic Ear: TM normal  bilaterally Mouth: no erythema or exudates, MMM Eyes: PERRL, EOMI, conjunctivae normal, No scleral icterus.  Neck: Supple, Trachea midline normal ROM, No JVD, mass, thyromegaly, or carotid bruit present.  Cardiovascular: RRR, S1 normal, S2 normal, no MRG, pulses symmetric and intact bilaterally Pulmonary/Chest: CTAB, no wheezes, rales, or rhonchi Abdominal: Soft. Non-tender, non-distended, bowel sounds are normal, no masses, organomegaly, or guarding present.  GU: no CVA tenderness Musculoskeletal: No joint deformities, erythema, or stiffness, ROM full and no nontender Hematology: no cervical, inginal, or axillary adenopathy.  Neurological: A&O x3, Strenght is normal and symmetric bilaterally, cranial nerve II-XII are grossly intact, no focal motor deficit, sensory intact to light touch bilaterally.  Skin: Warm, dry and intact. No rash, cyanosis, or clubbing. Both feet with bunnions to 1st MP joints; tender  Multiple calluses to plantar surfaces bilaterally. Psychiatric: Normal mood and affect. speech and behavior is normal. Judgment and thought content normal. Cognition and memory are normal.   Assessment & Plan:  1. Multiple plantar calluses bilaterally. -Patient is under Dr. Stephenie Acres care (podiatrist). -Rx for diabetic shoes given. -Foot care reviewed with the  Patient.  2. Please, note that a Dx of DM, type 2 is being removed after careful review of previous medical records (confirmed by an Attending Dr. Hilma Favors). -HgbA1c throughout several years ran between 5-5.57 range. -This was explained to the patient who was instructed to stop testing CBG's.  -Patient is off metformin -Will recheck HgbA1C in 6 months. -Patient was instructed to call with any health concerns.

## 2011-09-28 NOTE — Telephone Encounter (Signed)
Diabetic shoe request signed yesterday and faxed [phone]

## 2011-09-28 NOTE — Progress Notes (Deleted)
  Subjective:    [name], male    DOB: 01-Sep-1951, 60 y.o.   MRN: [id number]  HPI    Review of Systems     Objective:   Physical Exam        Assessment & Plan:

## 2011-09-29 LAB — HIV-1 RNA QUANT-NO REFLEX-BLD
HIV 1 RNA Quant: <20 copies/mL (ref <20)
HIV-1 RNA Quant, Log: <1.3 {Log} (ref <1.30)

## 2011-10-11 ENCOUNTER — Ambulatory Visit (INDEPENDENT_AMBULATORY_CARE_PROVIDER_SITE_OTHER): Payer: PRIVATE HEALTH INSURANCE | Admitting: Internal Medicine

## 2011-10-11 ENCOUNTER — Encounter: Payer: Self-pay | Admitting: Internal Medicine

## 2011-10-11 VITALS — BP 156/75 | HR 60 | Temp 98.4°F | Ht 74.0 in | Wt 175.0 lb

## 2011-10-11 DIAGNOSIS — B20 Human immunodeficiency virus [HIV] disease: Secondary | ICD-10-CM

## 2011-10-11 NOTE — Progress Notes (Signed)
[name], male   DOB: [birth year], 60 y.o.   MRN: [id number]  INFECTIOUS DISEASE PROGRESS NOTE    Subjective: Jacob [name] is in for his routine visit. He denies missing any doses of his HIV medications since his last visit. He has stayed on his no salt diet and has been weighing himself regularly. He denies any alcohol or cocaine use since he was in the hospital several months ago.  Objective:   General: He is in good spirits Skin: Has no rash Lungs: Clear Cor: Regular S1 and S2 no murmurs Abdomen: Soft and nontender   Lab Results Lab Results  Component Value Date   WBC 5.9 09/27/2011   HGB 13.9 09/27/2011   HCT 38.7* 09/27/2011   MCV 106.9* 09/27/2011   PLT 151 09/27/2011    Lab Results  Component Value Date   CREATININE 1.27 09/27/2011   BUN 26* 09/27/2011   NA 138 09/27/2011   K 4.2 09/27/2011   CL 104 09/27/2011   CO2 26 09/27/2011    Lab Results  Component Value Date   ALT 20 09/27/2011   AST 24 09/27/2011   ALKPHOS 84 09/27/2011   BILITOT 1.4* 09/27/2011      HIV 1 RNA Quant (copies/mL)  Date Value  09/27/2011 <20   06/21/2011 <20   04/04/2011 <20      CD4 T Cell Abs (cmm)  Date Value  09/27/2011 1390   06/21/2011 630   09/20/2010 900        Microbiology: No results found for this or any previous visit (from the past 240 hour(s)).  Studies/Results: No results found.   Assessment: Jacob [name]'s adherence remains excellent and as a result of his HIV infection is under very good control. I will continue his current regimen.  It appears that he has done a much better job of abstaining from alcohol and cocaine. I've encouraged him to keep this up.  Plan: 1. Continue current antiretroviral medications. 2. Return to clinic after lab work in [place] months   [name], MD [facility] for [place] pager   [phone] cell 10/11/2011, 10:28 AM

## 2011-10-25 HISTORY — PX: COLONOSCOPY: SHX174

## 2011-10-27 ENCOUNTER — Telehealth: Payer: Self-pay | Admitting: Cardiology

## 2011-10-27 NOTE — Telephone Encounter (Signed)
New Problem:    Called in to tell you that patient has lost 7.8 pounds overnight and would like some advice.

## 2011-10-28 NOTE — Telephone Encounter (Signed)
Left Voice Message with [facility].

## 2011-10-31 ENCOUNTER — Encounter: Payer: Self-pay | Admitting: Cardiology

## 2011-10-31 ENCOUNTER — Ambulatory Visit (INDEPENDENT_AMBULATORY_CARE_PROVIDER_SITE_OTHER): Payer: Medicaid Other | Admitting: Cardiology

## 2011-10-31 DIAGNOSIS — I10 Essential (primary) hypertension: Secondary | ICD-10-CM

## 2011-10-31 DIAGNOSIS — F17200 Nicotine dependence, unspecified, uncomplicated: Secondary | ICD-10-CM

## 2011-10-31 DIAGNOSIS — I5022 Chronic systolic (congestive) heart failure: Secondary | ICD-10-CM

## 2011-10-31 DIAGNOSIS — E785 Hyperlipidemia, unspecified: Secondary | ICD-10-CM

## 2011-10-31 DIAGNOSIS — F1910 Other psychoactive substance abuse, uncomplicated: Secondary | ICD-10-CM

## 2011-10-31 DIAGNOSIS — I2510 Atherosclerotic heart disease of native coronary artery without angina pectoris: Secondary | ICD-10-CM

## 2011-10-31 NOTE — Assessment & Plan Note (Signed)
Has been under good control. Very active this morning at work. I rechecked before he left and it was 138/88. No change in medical therapy

## 2011-10-31 NOTE — Assessment & Plan Note (Signed)
Stable.  Continue current medications.

## 2011-10-31 NOTE — Assessment & Plan Note (Signed)
Unfortunately, continues to do cocaine. He said the last time was in September. Because of this, I do not feel that he is a candidate for a defibrillator.

## 2011-10-31 NOTE — Assessment & Plan Note (Signed)
Stable. Continue medical therapy.

## 2011-10-31 NOTE — Patient Instructions (Signed)
Your physician wants you to follow-up in: 6 months.  You will receive a reminder letter in the mail two  months in advance. If you don't receive a letter, please call our office to schedule the follow-up appointment.  Stop Smoking!!!

## 2011-10-31 NOTE — Assessment & Plan Note (Signed)
Advised to quit yet again

## 2011-10-31 NOTE — Progress Notes (Signed)
HPI [name] returns today for evaluation and management of his coronary disease, history of chronic systolic heart failure, hypertension, hyperlipidemia, tobacco use, and history of polysubstance abuse.  He was hospitalized in September for what sounds like an overdose on codeine, Vicodin, and he also used cocaine at the time. He still smokes about a pack of cigarettes every 2 days.  He denies any angina or ischemic symptoms. His weight has been stable. Blood pressure is under good control. His lipids are well below goal.  Past Medical History  Diagnosis Date  . Syphilis   . CAD (coronary artery disease)     s/p surgery  . DM2 (diabetes mellitus, type 2)   . HIV disease   . AR (allergic rhinitis)   . Gout   . HLD (hyperlipidemia)   . HTN (hypertension)   . Ischemic cardiomyopathy     EF 30%  . Chronic systolic heart failure   . PN (peripheral neuropathy)   . Drug abuse     hx; tested for cocaine as recently as 2/08. says he is not using drugs now - avoided defib. for this reason     Current Outpatient Prescriptions  Medication Sig Dispense Refill  . allopurinol (ZYLOPRIM) 300 MG tablet Take 1 tablet (300 mg total) by mouth daily.  30 tablet  6  . aspirin 81 MG tablet Take 81 mg by mouth daily.        [name] atorvastatin (LIPITOR) 40 MG tablet Take 1 tablet (40 mg total) by mouth daily.  30 tablet  6  . calcium carbonate (OS-CAL) 600 MG TABS Take 600 mg by mouth 2 (two) times daily with a meal.        . carvedilol (COREG) 25 MG tablet Take 1 tablet (25 mg total) by mouth 2 (two) times daily with a meal.  180 tablet  3  . enalapril (VASOTEC) 5 MG tablet Take 1 tablet (5 mg total) by mouth 2 (two) times daily.  180 tablet  3  . lamiVUDine-zidovudine (COMBIVIR) 150-300 MG per tablet TAKE 1 TABLET BY MOUTH    TWICE A DAY  60 tablet  5  . naftifine (NAFTIN) 1 % cream Apply topically daily.        . nitroGLYCERIN (NITROSTAT) 0.3 MG SL tablet Place 1 tablet (0.3 mg total) under the tongue  every 5 (five) minutes as needed.  90 tablet  6  . REYATAZ 200 MG capsule TAKE 2 CAPSULES BY MOUTH  ONCE A DAY IN THE MORNING  60 capsule  5  . traMADol (ULTRAM) 50 MG tablet Take 100 mg by mouth every 6 (six) hours as needed. Maximum dose= 8 tablets per day         No Known Allergies  History reviewed. No pertinent family history.  History   Social History  . Marital Status: Single    Spouse Name: N/A    Number of Children: N/A  . Years of Education: N/A   Occupational History  . Not on file.   Social History Main Topics  . Smoking status: Current Everyday Smoker -- 0.5 packs/day for 36 years    Types: Cigarettes  . Smokeless tobacco: Never Used   Comment: has a goal of 10/24/11  . Alcohol Use: Yes     drinks beer sometimes  . Drug Use: No     none since 2008   . Sexually Active: Not Currently     declined condoms   Other Topics Concern  . Not  on file   Social History Narrative   Retired.     ROS ALL NEGATIVE EXCEPT THOSE NOTED IN HPI  PE  General Appearance: well developed, well nourished in no acute distress HEENT: symmetrical face, PERRLA,  Neck: no JVD, thyromegaly, or adenopathy, trachea midline Chest: symmetric without deformity Cardiac: PMI non-displaced, RRR, normal S1, S2, no gallop or murmur Lung: clear to ausculation and percussion Vascular: all pulses full without bruits  Abdominal: nondistended, nontender, good bowel sounds, no HSM, no bruits Extremities: no cyanosis, clubbing or edema, no sign of DVT, no varicosities  Skin: normal color, no rashes Neuro: alert and oriented x 3, non-focal Pysch: normal affect  EKG Not repeated today. BMET    Component Value Date/Time   NA 138 09/27/2011 1009   K 4.2 09/27/2011 1009   CL 104 09/27/2011 1009   CO2 26 09/27/2011 1009   GLUCOSE 135* 09/27/2011 1009   BUN 26* 09/27/2011 1009   CREATININE 1.27 09/27/2011 1009   CREATININE 0.90 07/07/2011 0445   CALCIUM 9.1 09/27/2011 1009   GFRNONAA >60 07/07/2011  0445   GFRAA >60 07/07/2011 0445    Lipid Panel     Component Value Date/Time   CHOL 109 09/27/2011 1009   TRIG 169* 09/27/2011 1009   HDL 36* 09/27/2011 1009   CHOLHDL 3.0 09/27/2011 1009   VLDL 34 09/27/2011 1009   LDLCALC 39 09/27/2011 1009    CBC    Component Value Date/Time   WBC 5.9 09/27/2011 1009   RBC 3.62* 09/27/2011 1009   HGB 13.9 09/27/2011 1009   HCT 38.7* 09/27/2011 1009   PLT 151 09/27/2011 1009   MCV 106.9* 09/27/2011 1009   MCH 38.4* 09/27/2011 1009   MCHC 35.9 09/27/2011 1009   RDW 12.9 09/27/2011 1009   LYMPHSABS 1.2 06/30/2011 0521   MONOABS 0.5 06/30/2011 0521   EOSABS 0.1 06/30/2011 0521   EOSABS 0.2 K/UL 08/14/2006 1039   BASOSABS 0.0 06/30/2011 0521

## 2011-11-24 ENCOUNTER — Other Ambulatory Visit: Payer: Self-pay | Admitting: *Deleted

## 2011-11-25 ENCOUNTER — Encounter: Payer: Self-pay | Admitting: Internal Medicine

## 2011-11-25 ENCOUNTER — Telehealth: Payer: Self-pay | Admitting: *Deleted

## 2011-11-25 DIAGNOSIS — M21612 Bunion of left foot: Secondary | ICD-10-CM | POA: Insufficient documentation

## 2011-11-25 DIAGNOSIS — M21611 Bunion of right foot: Secondary | ICD-10-CM | POA: Insufficient documentation

## 2011-11-25 MED ORDER — ALLOPURINOL 300 MG PO TABS: 300.0000 mg | ORAL_TABLET | Freq: Every day | ORAL | Status: DC

## 2011-11-25 NOTE — Telephone Encounter (Signed)
This was sent in response to a RFI from Patient Experience office: Mr. Shuford sees [facility] as well as [facility] physicians.  He called [facility] for a refill. They refused.  He called [facility] yesterday.  There were no refills on the allopurinol and the doctor needed to be contacted to prescribe it, yesterday.  Today Dr. Jeanette Caprice approved a refill, which was called in to the pharmacy today.  [facility] has a 72 hour turnaround policy for refills this was 24hrs at best.  This is consistent with other primary care practices. Diabetic shoe forms are completed when the doctor can get to it.  He did have a problem getting it done.  Also consistent with other practices.  I would appreciate my patients discussing their issues with me so I can look into it.  This is starting to be a pattern with this patient, he is also well known to me. Thanks

## 2011-11-25 NOTE — Telephone Encounter (Signed)
Jacob [name] called Patient Experience office again, yes within the same hour.  His complaint is the diabetic shoe paperwork.  I copied the date of fax from [facility].  I do not know how to copy the scanned document?

## 2011-11-28 ENCOUNTER — Telehealth: Payer: Self-pay | Admitting: *Deleted

## 2011-11-28 NOTE — Telephone Encounter (Signed)
I spoke with Jacob [name] today and assured him that his paperwork would be completed and confirmed with co. t oday.  He has picked up his medication as well.

## 2011-12-26 ENCOUNTER — Telehealth: Payer: Self-pay | Admitting: *Deleted

## 2011-12-26 NOTE — Telephone Encounter (Signed)
I spoke with Jacob [name] today, third call.  He also contacted Patient Experience Department. He is well known to patient experience department. His complaint was his diabetic foot paperwork, again.  Dr. Dolores Patty note from 12.4 states she discussed this with him in detail.  He is not diabetic and can not get free diabetic shoes.   His behavior and defamatory comments toward the clinic continue to be very challenging.  When he wanted to go over my head and "who could he see about this", I told him his doctor, make an appointment to discuss it. He was very upset and hung up.

## 2011-12-27 ENCOUNTER — Encounter: Payer: PRIVATE HEALTH INSURANCE | Admitting: Internal Medicine

## 2011-12-30 ENCOUNTER — Encounter: Payer: Self-pay | Admitting: Internal Medicine

## 2011-12-30 DIAGNOSIS — E118 Type 2 diabetes mellitus with unspecified complications: Secondary | ICD-10-CM | POA: Insufficient documentation

## 2011-12-30 NOTE — Progress Notes (Signed)
I had a message from Patient Experience regarding Jacob [name]'s diabetic shoes. I reviewed the chart and saw several messages from Jacob [name] who informed the pt that he was not diabetic and therefore not entitled to DM shoes. No one, inc patient experience, had gotten the full story. Jacob [name] completed and faxed the DM shoe form on Dec 4th. He received those shoes. He is now wanting another form filled out to get a second pair of diabetic shoes. The pair he got in Dec were winter shoes - heavy, waterproof, etc. He needs a pair for warmer weather. I am not commenting on the evidence that DM shoes are beneficial in preventing foot complications nor am I commenting on the cost effectiveness of using taxpayer's dollars for shoes. But he is diabetic and Medicare does pay for one pair of shoes, or three insoles, per year. Therefore, I have sent a message to Jacob [name] to see if she is willing to fill out a second form.   Additionally, I am re-adding DM to his PMHx and PL. I understand that he now is able to keep his A1C controlled off all meds. From what I could discover, he was dx in 2004. He came to our clinic in 5/07 and was on Glucotrol XL 10 mg BID and metformin 500 BID. His max A1C on those two meds was 6.9 on 03/10/08. He has been able to be removed from the meds while maintaining a good A1C. He has DM neuropathy on his problem list. Jacob [name] felt that he was DM bc she certified that he needed diabetic shoes.

## 2012-01-15 ENCOUNTER — Encounter (HOSPITAL_COMMUNITY): Payer: Self-pay | Admitting: *Deleted

## 2012-01-15 ENCOUNTER — Emergency Department (INDEPENDENT_AMBULATORY_CARE_PROVIDER_SITE_OTHER)
Admission: EM | Admit: 2012-01-15 | Discharge: 2012-01-15 | Disposition: A | Discharge status: Home / Self Care | Payer: PRIVATE HEALTH INSURANCE | Source: Home / Self Care

## 2012-01-15 DIAGNOSIS — J069 Acute upper respiratory infection, unspecified: Secondary | ICD-10-CM

## 2012-01-15 MED ORDER — AMOXICILLIN 875 MG PO TABS: 875.0000 mg | ORAL_TABLET | Freq: Two times a day (BID) | ORAL | Status: AC

## 2012-01-15 NOTE — ED Provider Notes (Signed)
History     CSN: [id number]  Arrival date & time 01/15/12  1117   None     Chief Complaint  Patient presents with  . Nasal Congestion  . Generalized Body Aches  . Cough    (Consider location/radiation/quality/duration/timing/severity/associated sxs/prior treatment) HPI Comments: Pt presents today with c/o nasal congestion and cough. Symptoms began yesterday morning. He describes yellow/green nasal mucus, nasal congestion, and pressure in maxillary sinuses. Cough worse in morning when he first awakens. No dyspnea or wheezing. No fever. He has a mild sore throat and has used cough drops for this. Pt states he had the same symptoms last yr and they did not improve until his Dr prescribed Amoxicillin for him. He is a smoker and is HIV pos.    Past Medical History  Diagnosis Date  . Syphilis   . CAD (coronary artery disease)     s/p surgery  . DM2 (diabetes mellitus, type 2) 2004    Diagnosed in 2004. Came to our clinic in 5/07 and was on Glucotrol XL 10 mg BID and metformin 500 BID. Max A1C on those two meds was 6.9 on 03/10/08. He has been able to be removed from the meds while maintaining a good A1C. He has DM neuropathy (although neuropathy could be multifactorial.)  . HIV disease   . AR (allergic rhinitis)   . Gout   . HLD (hyperlipidemia)   . HTN (hypertension)   . Ischemic cardiomyopathy     EF 30%  . Chronic systolic heart failure   . PN (peripheral neuropathy)   . Drug abuse     hx; tested for cocaine as recently as 2/08. says he is not using drugs now - avoided defib. for this reason     Past Surgical History  Procedure Date  . Coronary artery bypass graft   . Left intertrochanteric hip fracture     s/p intermedullary nail placement 2/08  . Left transverse mandibular fracture 10/05  . Laproscopic cholecystectomy 8/07    History reviewed. No pertinent family history.  History  Substance Use Topics  . Smoking status: Current Everyday Smoker -- 0.5 packs/day for  36 years    Types: Cigarettes  . Smokeless tobacco: Never Used   Comment: has a goal of 10/24/11  . Alcohol Use: Yes     drinks beer sometimes      Review of Systems  Constitutional: Negative for fever, chills and fatigue.  HENT: Positive for congestion, sore throat, rhinorrhea, postnasal drip and sinus pressure. Negative for ear pain.   Respiratory: Positive for cough. Negative for shortness of breath and wheezing.   Cardiovascular: Negative for chest pain.    Allergies  Amphetamines  Home Medications   Current Outpatient Rx  Name Route Sig Dispense Refill  . ALLOPURINOL 300 MG PO TABS Oral Take 1 tablet (300 mg total) by mouth daily. 30 tablet 6  . ASPIRIN 81 MG PO TABS Oral Take 81 mg by mouth daily.      . ATORVASTATIN CALCIUM 40 MG PO TABS Oral Take 1 tablet (40 mg total) by mouth daily. 30 tablet 6  . CARVEDILOL 25 MG PO TABS Oral Take 1 tablet (25 mg total) by mouth 2 (two) times daily with a meal. 180 tablet 3  . COLCHICINE 0.6 MG PO TABS Oral Take 0.6 mg by mouth continuous as needed.    . ENALAPRIL MALEATE 5 MG PO TABS Oral Take 1 tablet (5 mg total) by mouth 2 (two) times daily. [place]  tablet 3  . LAMIVUDINE-ZIDOVUDINE 150-300 MG PO TABS  TAKE 1 TABLET BY MOUTH    TWICE A DAY 60 tablet 5  . NAFTIFINE HCL 1 % EX CREA Topical Apply topically daily.      . REYATAZ 200 MG PO CAPS  TAKE 2 CAPSULES BY MOUTH  ONCE A DAY IN THE MORNING 60 capsule 5  . AMOXICILLIN 875 MG PO TABS Oral Take 1 tablet (875 mg total) by mouth 2 (two) times daily. 20 tablet 0  . NITROGLYCERIN 0.3 MG SL SUBL Sublingual Place 1 tablet (0.3 mg total) under the tongue every 5 (five) minutes as needed. 90 tablet 6    BP 151/85  Pulse 65  Temp(Src) 98.5 F (36.9 C) (Oral)  Resp 16  SpO2 98%  Physical Exam  Nursing note and vitals reviewed. Constitutional: He appears well-developed and well-nourished. No distress.  HENT:  Head: Normocephalic and atraumatic.  Right Ear: Tympanic membrane, external  ear and ear canal normal.  Left Ear: Tympanic membrane, external ear and ear canal normal.  Nose: Nose normal. Right sinus exhibits no maxillary sinus tenderness. Left sinus exhibits no maxillary sinus tenderness.  Mouth/Throat: Uvula is midline, oropharynx is clear and moist and mucous membranes are normal. No oropharyngeal exudate, posterior oropharyngeal edema or posterior oropharyngeal erythema.  Neck: Neck supple.  Cardiovascular: Normal rate, regular rhythm and normal heart sounds.   Pulmonary/Chest: Effort normal and breath sounds normal. No respiratory distress.  Lymphadenopathy:    He has no cervical adenopathy.  Neurological: He is alert.  Skin: Skin is warm and dry.  Psychiatric: He has a normal mood and affect.    ED Course  Procedures (including critical care time)  Labs Reviewed - No data to display No results found.   1. Acute URI       MDM          Carmel Sacramento, PA 01/15/12 1246

## 2012-01-15 NOTE — ED Provider Notes (Signed)
Medical screening examination/treatment/procedure(s) were performed by non-physician practitioner and as supervising physician I was immediately available for consultation/collaboration.  Burnett Kanaris, MD 01/15/12 [phone]

## 2012-01-15 NOTE — ED Notes (Signed)
Pt with onset of sinus congestion/cough/sorethroat/geralized body aching  yesterday morning

## 2012-01-15 NOTE — Discharge Instructions (Signed)
Increase fluids and rest. Warm compresses to your face as needed. Follow up with your primary care dr if symptoms are not improving in 4-5 days, sooner if worsening.    Upper Respiratory Infection, Adult An upper respiratory infection (URI) is also sometimes known as the common cold. The upper respiratory tract includes the nose, sinuses, throat, trachea, and bronchi. Bronchi are the airways leading to the lungs. Most people improve within 1 week, but symptoms can last up to 2 weeks. A residual cough may last even longer.  CAUSES Many different viruses can infect the tissues lining the upper respiratory tract. The tissues become irritated and inflamed and often become very moist. Mucus production is also common. A cold is contagious. You can easily spread the virus to others by oral contact. This includes kissing, sharing a glass, coughing, or sneezing. Touching your mouth or nose and then touching a surface, which is then touched by another person, can also spread the virus. SYMPTOMS  Symptoms typically develop 1 to 3 days after you come in contact with a cold virus. Symptoms vary from person to person. They may include:  Runny nose.   Sneezing.   Nasal congestion.   Sinus irritation.   Sore throat.   Loss of voice (laryngitis).   Cough.   Fatigue.   Muscle aches.   Loss of appetite.   Headache.   Low-grade fever.  DIAGNOSIS  You might diagnose your own cold based on familiar symptoms, since most people get a cold 2 to 3 times a year. Your caregiver can confirm this based on your exam. Most importantly, your caregiver can check that your symptoms are not due to another disease such as strep throat, sinusitis, pneumonia, asthma, or epiglottitis. Blood tests, throat tests, and X-rays are not necessary to diagnose a common cold, but they may sometimes be helpful in excluding other more serious diseases. Your caregiver will decide if any further tests are required. RISKS AND  COMPLICATIONS  You may be at risk for a more severe case of the common cold if you smoke cigarettes, have chronic heart disease (such as heart failure) or lung disease (such as asthma), or if you have a weakened immune system. The very young and very old are also at risk for more serious infections. Bacterial sinusitis, middle ear infections, and bacterial pneumonia can complicate the common cold. The common cold can worsen asthma and chronic obstructive pulmonary disease (COPD). Sometimes, these complications can require emergency medical care and may be life-threatening. PREVENTION  The best way to protect against getting a cold is to practice good hygiene. Avoid oral or hand contact with people with cold symptoms. Wash your hands often if contact occurs. There is no clear evidence that vitamin C, vitamin E, echinacea, or exercise reduces the chance of developing a cold. However, it is always recommended to get plenty of rest and practice good nutrition. TREATMENT  Treatment is directed at relieving symptoms. There is no cure. Antibiotics are not effective, because the infection is caused by a virus, not by bacteria. Treatment may include:  Increased fluid intake. Sports drinks offer valuable electrolytes, sugars, and fluids.   Breathing heated mist or steam (vaporizer or shower).   Eating chicken soup or other clear broths, and maintaining good nutrition.   Getting plenty of rest.   Using gargles or lozenges for comfort.   Controlling fevers with ibuprofen or acetaminophen as directed by your caregiver.   Increasing usage of your inhaler if you have asthma.  Zinc gel and zinc lozenges, taken in the first 24 hours of the common cold, can shorten the duration and lessen the severity of symptoms. Pain medicines may help with fever, muscle aches, and throat pain. A variety of non-prescription medicines are available to treat congestion and runny nose. Your caregiver can make recommendations and  may suggest nasal or lung inhalers for other symptoms.  HOME CARE INSTRUCTIONS   Only take over-the-counter or prescription medicines for pain, discomfort, or fever as directed by your caregiver.   Use a warm mist humidifier or inhale steam from a shower to increase air moisture. This may keep secretions moist and make it easier to breathe.   Drink enough water and fluids to keep your urine clear or pale yellow.   Rest as needed.   Return to work when your temperature has returned to normal or as your caregiver advises. You may need to stay home longer to avoid infecting others. You can also use a face mask and careful hand washing to prevent spread of the virus.  SEEK MEDICAL CARE IF:   After the first few days, you feel you are getting worse rather than better.   You need your caregiver's advice about medicines to control symptoms.   You develop chills, worsening shortness of breath, or brown or red sputum. These may be signs of pneumonia.   You develop yellow or brown nasal discharge or pain in the face, especially when you bend forward. These may be signs of sinusitis.   You develop a fever, swollen neck glands, pain with swallowing, or white areas in the back of your throat. These may be signs of strep throat.  SEEK IMMEDIATE MEDICAL CARE IF:   You have a fever.   You develop severe or persistent headache, ear pain, sinus pain, or chest pain.   You develop wheezing, a prolonged cough, cough up blood, or have a change in your usual mucus (if you have chronic lung disease).   You develop sore muscles or a stiff neck.  Document Released: 04/05/2001 Document Revised: 09/29/2011 Document Reviewed: 02/11/2011 [facility] Patient Information 2012 [place].

## 2012-01-21 ENCOUNTER — Encounter: Payer: Self-pay | Admitting: Internal Medicine

## 2012-01-21 NOTE — Progress Notes (Signed)
Patient called around 7 AM stating that his blood sugars are running high. He states that he was recently seen at the urgent care center about 1 week ago for URI and was prescribed Amoxicillin 875 mg BID. He has a home nurse visiting him who told him that it could be from his Amoxicillin. This morning his CBG was 324. It has been running in  250's- 300's lately.  He states that he has an appointment on Monday with Dr. Stanford Scotland.  Plan: -He was educated that these high blood sugars are probably related to infection and we might need to start him back on his meds with his clinic follow up on Monday. -He was advised to call us if his blood sugars are more than 500. -He was advised to get his meter with the appointment that would help [place] in guiding his regimen.

## 2012-01-22 ENCOUNTER — Telehealth: Payer: Self-pay | Admitting: Internal Medicine

## 2012-01-22 DIAGNOSIS — R739 Hyperglycemia, unspecified: Secondary | ICD-10-CM | POA: Insufficient documentation

## 2012-01-22 NOTE — Assessment & Plan Note (Signed)
-   Consider check HgbA1c on clinic follow-up with Dr. Stanford Scotland.

## 2012-01-22 NOTE — Telephone Encounter (Signed)
  INTERNAL MEDICINE RESIDENCY PROGRAM After-Hours Telephone Call    Reason for call:   [name] called with concern about hyperglycemia experienced since January 15, 2012. At that time, he was seen at [facility] for nasal congestion, productive cough with yellow/green sputum --> diagnosed with a URI, and treated with Amoxicillin - which he will complete on Monday. Symptoms are persistent but mildly improved. Yesterday, patient noted his blood sugars to be elevated in the 300s. Then, overnight noted his blood sugar to 571 mg/dL, and recheck a few hours later showed level of 404 mg/dL. In between these times, he had drank a lot of water. Did not administer any insulin or other hypoglycemic agents.  Patient confirms increased frequency of urination. Denies nausea, vomiting, abdominal pain confusion, vision changes, dysuria, hematuria, fevers, chills.  Indicates that for dinner last night he ate macaroni and cheese, two pieces of bread, some meat.  He has been off of all diabetic medications since 09/2011 because of well controlled diabetes.    Last clinic/ hospital encounter:   09/27/2011 - Last seen in Clinic by Dr. Stanford Scotland for evaluation of bilateral plantar calluses and request for diabetic shoes. DMII was actually removed from active problem list and moved to his medical history because pt was no longer requiring medications for diabetes control with last A1c of 5.7 in 07/2011.    Assessment / Plan:   Hyperglycemia is likely in setting of acute infection.   Symptoms at this point are not concerning for DKA or HHS.   Blood sugars have decreased on their own without intervention.  Patient advised to continue checking blood sugars, if he becomes symptomatic at all, he should report to the ER for evaluation.  If remains asymptomatic, he should keep his appt on Monday with Dr. Stanford Scotland so that we can monitor his blood sugars with consideration to restart medication.  Advised to continue  to drink plenty of water, not too much to get short of breath or lower extremity swelling - given that he has an EF of 35-40% and grade 3 diastolic dysfunction.  Advised to stay away from carbohydrates if at all possible until his follow-up visit.  Advised to present to ER if blood sugars continue to rise or symptoms arise.     Chilton Greathouse, D.O.  01/22/2012, 2:35 AM

## 2012-01-23 ENCOUNTER — Ambulatory Visit (INDEPENDENT_AMBULATORY_CARE_PROVIDER_SITE_OTHER): Payer: PRIVATE HEALTH INSURANCE | Admitting: Internal Medicine

## 2012-01-23 ENCOUNTER — Encounter: Payer: PRIVATE HEALTH INSURANCE | Admitting: Internal Medicine

## 2012-01-23 ENCOUNTER — Telehealth: Payer: Self-pay | Admitting: *Deleted

## 2012-01-23 ENCOUNTER — Other Ambulatory Visit: Payer: Self-pay | Admitting: Internal Medicine

## 2012-01-23 ENCOUNTER — Encounter: Payer: Self-pay | Admitting: Internal Medicine

## 2012-01-23 ENCOUNTER — Other Ambulatory Visit: Payer: Self-pay | Admitting: Cardiology

## 2012-01-23 VITALS — BP 98/62 | HR 85 | Temp 97.8°F | Ht 75.0 in | Wt 176.1 lb

## 2012-01-23 DIAGNOSIS — E785 Hyperlipidemia, unspecified: Secondary | ICD-10-CM

## 2012-01-23 DIAGNOSIS — E118 Type 2 diabetes mellitus with unspecified complications: Secondary | ICD-10-CM

## 2012-01-23 DIAGNOSIS — E119 Type 2 diabetes mellitus without complications: Secondary | ICD-10-CM

## 2012-01-23 DIAGNOSIS — Z79899 Other long term (current) drug therapy: Secondary | ICD-10-CM

## 2012-01-23 LAB — POCT GLYCOSYLATED HEMOGLOBIN (HGB A1C): Hemoglobin A1C: 9.9

## 2012-01-23 LAB — GLUCOSE, CAPILLARY: Glucose-Capillary: 466 mg/dL — ABNORMAL HIGH (ref 70–99)

## 2012-01-23 MED ORDER — INSULIN REGULAR HUMAN 100 UNIT/ML IJ SOLN: 10.0000 [IU] | Freq: Once | INTRAMUSCULAR | Status: DC

## 2012-01-23 MED ORDER — INSULIN GLARGINE 100 UNIT/ML ~~LOC~~ SOLN: 9.0000 [IU] | Freq: Every day | SUBCUTANEOUS | Status: DC

## 2012-01-23 MED ORDER — ATORVASTATIN CALCIUM 40 MG PO TABS: 40.0000 mg | ORAL_TABLET | Freq: Every day | ORAL | Status: DC

## 2012-01-23 MED ORDER — INSULIN ASPART 100 UNIT/ML ~~LOC~~ SOLN
10.0000 [IU] | Freq: Once | SUBCUTANEOUS | Status: AC
  Administered 2012-01-23: 10 [IU] via SUBCUTANEOUS

## 2012-01-23 MED ORDER — METFORMIN HCL 500 MG PO TABS: 500.0000 mg | ORAL_TABLET | Freq: Every day | ORAL | Status: DC

## 2012-01-23 NOTE — Patient Instructions (Addendum)
Please, inject 9 units of Lantus insulin pen every evening before bedtime (EXCEPT TONIGHT!) Please, do not skip meals! Please, check blood sugar before breakfast and bedtime. Please, call with ANY concerns and follow up with me approximately in 1 week.

## 2012-01-23 NOTE — Progress Notes (Signed)
[name], male   DOB: Jan 14, 1951, 61 y.o.   MRN: [id number] HPI:    1. DM, type 2 follow up. Patient states that his CBG's are in 300's at home. He was taken off metformin in 10/12 and missed several appointments since then. He denies any HA, dizziness,polyuria, anuria, CP, palpitations, abdominal pair or diarrhea.  Review of Systems: Negative except per history of present illness  Physical Exam:  Nursing notes and vitals reviewed General:  alert, well-developed, and cooperative to examination.   Lungs:  normal respiratory effort, no accessory muscle use, normal breath sounds, no crackles, and no wheezes. Heart:  normal rate, regular rhythm, no murmurs, no gallop, and no rub.   Abdomen:  soft, non-tender, normal bowel sounds, no distention, no guarding, no rebound tenderness, no hepatomegaly, and no splenomegaly.   Extremities:  No cyanosis, clubbing, edema Neurologic:  alert & oriented X3, nonfocal exam  Meds: Medications Prior to Admission  Medication Sig Dispense Refill  . allopurinol (ZYLOPRIM) 300 MG tablet Take 1 tablet (300 mg total) by mouth daily.  30 tablet  6  . amoxicillin (AMOXIL) 875 MG tablet Take 1 tablet (875 mg total) by mouth 2 (two) times daily.  20 tablet  0  . aspirin 81 MG tablet Take 81 mg by mouth daily.        . colchicine 0.6 MG tablet Take 0.6 mg by mouth continuous as needed.      . enalapril (VASOTEC) 5 MG tablet Take 1 tablet (5 mg total) by mouth 2 (two) times daily.  180 tablet  3  . insulin glargine (LANTUS SOLOSTAR) 100 UNIT/ML injection Inject 9 Units into the skin at bedtime.  10 mL  12  . lamiVUDine-zidovudine (COMBIVIR) 150-300 MG per tablet TAKE 1 TABLET BY MOUTH    TWICE A DAY  60 tablet  5  . metFORMIN (GLUCOPHAGE) 500 MG tablet Take 1 tablet (500 mg total) by mouth daily with breakfast.  30 tablet  11  . naftifine (NAFTIN) 1 % cream Apply topically daily.        . nitroGLYCERIN (NITROSTAT) 0.3 MG SL tablet Place 1 tablet (0.3 mg  total) under the tongue every 5 (five) minutes as needed.  90 tablet  6  . REYATAZ 200 MG capsule TAKE 2 CAPSULES BY MOUTH  ONCE A DAY IN THE MORNING  60 capsule  5  . DISCONTD: atorvastatin (LIPITOR) 40 MG tablet Take 1 tablet (40 mg total) by mouth daily.  30 tablet  6  . DISCONTD: calcium carbonate (OS-CAL) 600 MG TABS Take 600 mg by mouth 2 (two) times daily with a meal.        . DISCONTD: carvedilol (COREG) 25 MG tablet Take 1 tablet (25 mg total) by mouth 2 (two) times daily with a meal.  180 tablet  3   Medications Prior to Admission  Medication Dose Route Frequency Provider Last Rate Last Dose  . insulin aspart (novoLOG) injection 10 Units  10 Units Subcutaneous Once Heinz Knuckles, MD   10 Units at 01/23/12 1442  . DISCONTD: insulin regular (NOVOLIN R,HUMULIN R) 100 units/mL injection 10 Units  10 Units Subcutaneous Once Heinz Knuckles, MD        Allergies: Amphetamines Past Medical History  Diagnosis Date  . Syphilis   . CAD (coronary artery disease)     s/p surgery  . DM2 (diabetes mellitus, type 2) 2004    Diagnosed in 2004. Came to our clinic in  5/07 and was on Glucotrol XL 10 mg BID and metformin 500 BID. Max A1C on those two meds was 6.9 on 03/10/08. He has been able to be removed from the meds while maintaining a good A1C. He has DM neuropathy (although neuropathy could be multifactorial.)  . HIV disease   . AR (allergic rhinitis)   . Gout   . HLD (hyperlipidemia)   . HTN (hypertension)   . Ischemic cardiomyopathy     EF 30%  . Chronic systolic heart failure   . PN (peripheral neuropathy)   . Drug abuse     hx; tested for cocaine as recently as 2/08. says he is not using drugs now - avoided defib. for this reason    Past Surgical History  Procedure Date  . Coronary artery bypass graft   . Left intertrochanteric hip fracture     s/p intermedullary nail placement 2/08  . Left transverse mandibular fracture 10/05  . Laproscopic cholecystectomy 8/07   No  family history on file. History   Social History  . Marital Status: Single    Spouse Name: N/A    Number of Children: N/A  . Years of Education: N/A   Occupational History  . Not on file.   Social History Main Topics  . Smoking status: Current Everyday Smoker -- 0.5 packs/day for 36 years    Types: Cigarettes  . Smokeless tobacco: Never Used   Comment: has a goal of 10/24/11  . Alcohol Use: Yes     drinks beer sometimes  . Drug Use: No     none since 2008   . Sexually Active: Not Currently     declined condoms   Other Topics Concern  . Not on file   Social History Narrative   Retired.    Basic Metabolic Panel: No results found for this basename: NA:2,K:2,CL:2,CO2:2,GLUCOSE:2,BUN:2,CREATININE:2,CALCIUM:2,MG:2,PHOS:2 in the last 72 hours Liver Function Tests: No results found for this basename: AST:2,ALT:2,ALKPHOS:2,BILITOT:2,PROT:2,ALBUMIN:2 in the last 72 hours No results found for this basename: LIPASE:2,AMYLASE:2 in the last 72 hours No results found for this basename: AMMONIA:2 in the last 72 hours CBC: No results found for this basename: WBC:2,NEUTROABS:2,HGB:2,HCT:2,MCV:2,PLT:2 in the last 72 hours Cardiac Enzymes: No results found for this basename: CKTOTAL:3,CKMB:3,CKMBINDEX:3,TROPONINI:3 in the last 72 hours BNP: No results found for this basename: PROBNP:3 in the last 72 hours D-Dimer: No results found for this basename: DDIMER:2 in the last 72 hours CBG:  Basename 01/23/12 1523 01/23/12 1356  GLUCAP 298* 466*   Hemoglobin A1C:  Basename 01/23/12 1405  HGBA1C 9.9    A/P:  1. DM, type 2. Poorly controlled since was taken off Metformin and recent URI. -Today HgbA1C is 9.9 with CBG of 466. -Regular Insulin 10 Units SQ x1 now -Lantus 8 Units SQ qhs (Lantus Solostar pen given to the patient; proper use was taught and demonstrated back to me by the patient). -Patient states that he is unable to restart metfromin due to a financial difficulty until next  week. Will restart then and may taper insulin therapy down to off. -instructed to check his CBG's bid and call with ANY concerns or questions. -F/u in 1 week.

## 2012-01-23 NOTE — Telephone Encounter (Signed)
States he had just left [facility] where they had given him insulin to bring down his CBG of 466. He was here filling out a form for the dentist when he felt lightheaded & weak. stumbled when walking. CBG was checked here at 3:25pm & it was 298. C/o severe cramps in l hand & leg. States it did this last week when at [facility].  Upon further questioning he has night cramps as well. This has been happening for a year. I called IM. There was nothing down about this issue. He has a follow up next week there. I urged him to discuss with md.  States the cramps are now his groin. He finished the diet soda I gave him. He called a friend to come pick him up so he did not have to use the bus. I asked him to call IM & see if they could see him tomorrow to address this issue. He said he would call when he got home.  He is a current smoker.

## 2012-01-26 ENCOUNTER — Telehealth: Payer: Self-pay | Admitting: *Deleted

## 2012-01-26 NOTE — Telephone Encounter (Signed)
I left a message asking him to call me. I want to find out how he is doing & if he has had any more cramping. I had advised him to call IM for an appt the day after he was here. No appt in chart. Has an appt with IM on 01/31/12.

## 2012-01-27 ENCOUNTER — Telehealth: Payer: Self-pay | Admitting: *Deleted

## 2012-01-27 ENCOUNTER — Other Ambulatory Visit: Payer: Self-pay | Admitting: *Deleted

## 2012-01-27 DIAGNOSIS — Z113 Encounter for screening for infections with a predominantly sexual mode of transmission: Secondary | ICD-10-CM

## 2012-01-27 NOTE — Telephone Encounter (Signed)
He returned my call.  He is feeling much better. Has not had many cramps since & easily gone with a cold wet compress. Has appt at IM on 9th. Sugars are still varying widely

## 2012-01-31 ENCOUNTER — Ambulatory Visit (INDEPENDENT_AMBULATORY_CARE_PROVIDER_SITE_OTHER): Payer: PRIVATE HEALTH INSURANCE | Admitting: Internal Medicine

## 2012-01-31 ENCOUNTER — Encounter: Payer: Self-pay | Admitting: Internal Medicine

## 2012-01-31 VITALS — BP 144/76 | HR 68 | Temp 97.2°F | Ht 62.0 in | Wt 178.6 lb

## 2012-01-31 DIAGNOSIS — E119 Type 2 diabetes mellitus without complications: Secondary | ICD-10-CM

## 2012-01-31 MED ORDER — METFORMIN HCL 500 MG PO TABS: 500.0000 mg | ORAL_TABLET | Freq: Two times a day (BID) | ORAL | Status: AC

## 2012-01-31 NOTE — Patient Instructions (Signed)
Pleast, stop  Taking insulin. Please, increase Metformin to 500 mg One tablet twice a day with meals. Please, check your blood sugar daily and call if it is more than 300 or less than 100. Please, follow up with [place] in 4-8 weeks.

## 2012-01-31 NOTE — Progress Notes (Signed)
[name], male   DOB: 12-21-1950, 61 y.o.   MRN: [id number] HPI:    1.  DM, type 2. Denies SBG's less 120 or more than 250. Denies any difficulties and/or issues with insulin administration. 2. No other concerns.   Review of Systems: Negative except per history of present illness  Physical Exam:  Nursing notes and vitals reviewed General:  alert, well-developed, and cooperative to examination.   Lungs:  normal respiratory effort, no accessory muscle use, normal breath sounds, no crackles, and no wheezes. Heart:  normal rate, regular rhythm, no murmurs, no gallop, and no rub.   Abdomen:  soft, non-tender, normal bowel sounds, no distention, no guarding, no rebound tenderness, no hepatomegaly, and no splenomegaly.   Extremities:  No cyanosis, clubbing, edema Neurologic:  alert & oriented X3, nonfocal exam  Meds: Medications Prior to Admission  Medication Sig Dispense Refill  . allopurinol (ZYLOPRIM) 300 MG tablet Take 1 tablet (300 mg total) by mouth daily.  30 tablet  6  . aspirin 81 MG tablet Take 81 mg by mouth daily.        [name] atorvastatin (LIPITOR) 40 MG tablet Take 1 tablet (40 mg total) by mouth daily.  30 tablet  11  . carvedilol (COREG) 25 MG tablet take 1 tablet by mouth twice a day with food  60 tablet  0  . colchicine 0.6 MG tablet Take 0.6 mg by mouth continuous as needed.      . enalapril (VASOTEC) 5 MG tablet Take 1 tablet (5 mg total) by mouth 2 (two) times daily.  180 tablet  3  . lamiVUDine-zidovudine (COMBIVIR) 150-300 MG per tablet TAKE 1 TABLET BY MOUTH    TWICE A DAY  60 tablet  5  . naftifine (NAFTIN) 1 % cream Apply topically daily.        . nitroGLYCERIN (NITROSTAT) 0.3 MG SL tablet Place 1 tablet (0.3 mg total) under the tongue every 5 (five) minutes as needed.  90 tablet  6  . REYATAZ 200 MG capsule TAKE 2 CAPSULES BY MOUTH  ONCE A DAY IN THE MORNING  60 capsule  5  . DISCONTD: calcium carbonate (OS-CAL) 600 MG TABS Take 600 mg by mouth 2 (two)  times daily with a meal.         No current facility-administered medications on file as of 01/31/2012.    Allergies: Amphetamines Past Medical History  Diagnosis Date  . Syphilis   . CAD (coronary artery disease)     s/p surgery  . DM2 (diabetes mellitus, type 2) 2004    Diagnosed in 2004. Came to our clinic in 5/07 and was on Glucotrol XL 10 mg BID and metformin 500 BID. Max A1C on those two meds was 6.9 on 03/10/08. He has been able to be removed from the meds while maintaining a good A1C. He has DM neuropathy (although neuropathy could be multifactorial.)  . HIV disease   . AR (allergic rhinitis)   . Gout   . HLD (hyperlipidemia)   . HTN (hypertension)   . Ischemic cardiomyopathy     EF 30%  . Chronic systolic heart failure   . PN (peripheral neuropathy)   . Drug abuse     hx; tested for cocaine as recently as 2/08. says he is not using drugs now - avoided defib. for this reason    Past Surgical History  Procedure Date  . Coronary artery bypass graft   . Left intertrochanteric hip fracture  s/p intermedullary nail placement 2/08  . Left transverse mandibular fracture 10/05  . Laproscopic cholecystectomy 8/07   No family history on file. History   Social History  . Marital Status: Single    Spouse Name: N/A    Number of Children: N/A  . Years of Education: N/A   Occupational History  . Not on file.   Social History Main Topics  . Smoking status: Current Everyday Smoker -- 0.5 packs/day for 36 years    Types: Cigarettes  . Smokeless tobacco: Never Used   Comment: has a goal of 10/24/11  . Alcohol Use: Yes     drinks beer sometimes  . Drug Use: No     none since 2008   . Sexually Active: Not Currently     declined condoms   Other Topics Concern  . Not on file   Social History Narrative   Retired.    . A/P: 1. DM, type 2 -CBG's improving since restarted hypoglycemic therapy -D/C Insulin -increase Metformin 500 mg PO bid -CBG's daily -UTD on eye  exam -f/u in 4-8 weeks.

## 2012-02-19 ENCOUNTER — Encounter (HOSPITAL_COMMUNITY): Payer: Self-pay | Admitting: Physical Medicine and Rehabilitation

## 2012-02-19 ENCOUNTER — Emergency Department (HOSPITAL_COMMUNITY)
Admission: EM | Admit: 2012-02-19 | Discharge: 2012-02-19 | Disposition: A | Discharge status: Home / Self Care | Payer: PRIVATE HEALTH INSURANCE | Attending: Emergency Medicine | Admitting: Emergency Medicine

## 2012-02-19 DIAGNOSIS — I2510 Atherosclerotic heart disease of native coronary artery without angina pectoris: Secondary | ICD-10-CM | POA: Insufficient documentation

## 2012-02-19 DIAGNOSIS — M545 Low back pain, unspecified: Secondary | ICD-10-CM | POA: Insufficient documentation

## 2012-02-19 DIAGNOSIS — M25559 Pain in unspecified hip: Secondary | ICD-10-CM | POA: Insufficient documentation

## 2012-02-19 DIAGNOSIS — M549 Dorsalgia, unspecified: Secondary | ICD-10-CM

## 2012-02-19 DIAGNOSIS — E785 Hyperlipidemia, unspecified: Secondary | ICD-10-CM | POA: Insufficient documentation

## 2012-02-19 DIAGNOSIS — I2589 Other forms of chronic ischemic heart disease: Secondary | ICD-10-CM | POA: Insufficient documentation

## 2012-02-19 DIAGNOSIS — E119 Type 2 diabetes mellitus without complications: Secondary | ICD-10-CM | POA: Insufficient documentation

## 2012-02-19 DIAGNOSIS — Z21 Asymptomatic human immunodeficiency virus [HIV] infection status: Secondary | ICD-10-CM | POA: Insufficient documentation

## 2012-02-19 DIAGNOSIS — Z951 Presence of aortocoronary bypass graft: Secondary | ICD-10-CM | POA: Insufficient documentation

## 2012-02-19 DIAGNOSIS — I10 Essential (primary) hypertension: Secondary | ICD-10-CM | POA: Insufficient documentation

## 2012-02-19 DIAGNOSIS — G8929 Other chronic pain: Secondary | ICD-10-CM | POA: Insufficient documentation

## 2012-02-19 DIAGNOSIS — Z7982 Long term (current) use of aspirin: Secondary | ICD-10-CM | POA: Insufficient documentation

## 2012-02-19 MED ORDER — TRAMADOL HCL 50 MG PO TABS: 50.0000 mg | ORAL_TABLET | Freq: Four times a day (QID) | ORAL | Status: AC | PRN

## 2012-02-19 MED ORDER — OXYCODONE-ACETAMINOPHEN 5-325 MG PO TABS
2.0000 | ORAL_TABLET | Freq: Once | ORAL | Status: AC
  Administered 2012-02-19: 2 via ORAL
  Filled 2012-02-19: qty 2

## 2012-02-19 NOTE — ED Notes (Signed)
Pt presents to department via [facility] from home for evaluation of chronic L hip and lower back pain radiating down L leg to foot. Describes as stabbing pain. Ambulatory upon arrival. Denies recent injury. Pt is conscious alert and oriented x4. 10/10 pain at the time. Has received epidural for pain x1 year ago with relief, but states pain has come back recently.

## 2012-02-19 NOTE — ED Provider Notes (Signed)
History     CSN: [id number]  Arrival date & time 02/19/12  1034   First MD Initiated Contact with Patient 02/19/12 1035      Chief Complaint  Patient presents with  . Hip Pain  . Back Pain    (Consider location/radiation/quality/duration/timing/severity/associated sxs/prior treatment) HPI  Patient presents to the emergency department complaining of one-year history of chronic lower back pain and left hip pain that is followed by Dr. Meridee Score. Patient states he's been treated with tramadol with good controlled pain on a regular basis. Patient states that he has run out of his tramadol and called Dr. Jess Barters office to let them know but states that they would not refill his pain medication until he was reevaluated in office. Patient states that he spoke to Dr. Sharol Given today and said that he could see him in the office tomorrow however patient states he could not wait for pain management. Therefore states that Dr. Sharol Given instructed him to go to the emergency department for further evaluation and management for pain control. Patient denies any changing of his back pain. Patient states the back and hip pain is the same chronic pain he's had for many years but "I just don't have my pain medicine to take care of it." Patient denies any lower extremity numbness/tingling/weakness, loss of bowel or bladder function, saddle seat paresthesias, difficulty ambulating, abdominal pain, dysuria, hematuria, or blood in the stool. Patient states pain is constant, and unchanging. Patient states pain is aggravated by prolonged standing and movement. He denies any new injury to back.   Past Medical History  Diagnosis Date  . Syphilis   . CAD (coronary artery disease)     s/p surgery  . DM2 (diabetes mellitus, type 2) 2004    Diagnosed in 2004. Came to our clinic in 5/07 and was on Glucotrol XL 10 mg BID and metformin 500 BID. Max A1C on those two meds was 6.9 on 03/10/08. He has been able to be removed from the meds  while maintaining a good A1C. He has DM neuropathy (although neuropathy could be multifactorial.)  . HIV disease   . AR (allergic rhinitis)   . Gout   . HLD (hyperlipidemia)   . HTN (hypertension)   . Ischemic cardiomyopathy     EF 30%  . Chronic systolic heart failure   . PN (peripheral neuropathy)   . Drug abuse     hx; tested for cocaine as recently as 2/08. says he is not using drugs now - avoided defib. for this reason     Past Surgical History  Procedure Date  . Coronary artery bypass graft   . Left intertrochanteric hip fracture     s/p intermedullary nail placement 2/08  . Left transverse mandibular fracture 10/05  . Laproscopic cholecystectomy 8/07    History reviewed. No pertinent family history.  History  Substance Use Topics  . Smoking status: Current Everyday Smoker -- 0.5 packs/day for 36 years    Types: Cigarettes  . Smokeless tobacco: Never Used   Comment: has a goal of 10/24/11  . Alcohol Use: Yes     drinks beer sometimes      Review of Systems  All other systems reviewed and are negative.    Allergies  Amphetamines  Home Medications   Current Outpatient Rx  Name Route Sig Dispense Refill  . ALLOPURINOL 300 MG PO TABS Oral Take 1 tablet (300 mg total) by mouth daily. 30 tablet 6  . ASPIRIN 81 MG  PO TABS Oral Take 81 mg by mouth daily.      . ATORVASTATIN CALCIUM 40 MG PO TABS Oral Take 1 tablet (40 mg total) by mouth daily. 30 tablet 11  . CARVEDILOL 25 MG PO TABS  take 1 tablet by mouth twice a day with food 60 tablet 0  . ENALAPRIL MALEATE 5 MG PO TABS Oral Take 1 tablet (5 mg total) by mouth 2 (two) times daily. 180 tablet 3  . LAMIVUDINE-ZIDOVUDINE 150-300 MG PO TABS  TAKE 1 TABLET BY MOUTH    TWICE A DAY 60 tablet 5  . METFORMIN HCL 500 MG PO TABS Oral Take 1 tablet (500 mg total) by mouth 2 (two) times daily with a meal. 60 tablet 11  . NAFTIFINE HCL 1 % EX CREA Topical Apply 1 application topically daily as needed. Every time pt washes  his feet he applies cream.    . REYATAZ 200 MG PO CAPS  TAKE 2 CAPSULES BY MOUTH  ONCE A DAY IN THE MORNING 60 capsule 5  . TRAMADOL HCL 50 MG PO TABS Oral Take 100 mg by mouth every 6 (six) hours as needed. For pain.    [name] COLCHICINE 0.6 MG PO TABS Oral Take 0.6 mg by mouth continuous as needed. For gout.    [name] NITROGLYCERIN 0.3 MG SL SUBL Sublingual Place 1 tablet (0.3 mg total) under the tongue every 5 (five) minutes as needed. 90 tablet 6    BP 143/63  Pulse 74  Temp(Src) 98.3 F (36.8 C) (Oral)  Resp 16  SpO2 100%  Physical Exam  Nursing note and vitals reviewed. Constitutional: He is oriented to person, place, and time. He appears well-developed and well-nourished. No distress.  HENT:  Head: Normocephalic and atraumatic.  Eyes: Conjunctivae are normal.  Neck: Normal range of motion. Neck supple.  Cardiovascular: Normal rate, regular rhythm, normal heart sounds and intact distal pulses.  Exam reveals no gallop and no friction rub.   No murmur heard. Pulmonary/Chest: Effort normal and breath sounds normal. No respiratory distress. He has no wheezes. He has no rales. He exhibits no tenderness.  Abdominal: Soft. Bowel sounds are normal. He exhibits no distension and no mass. There is no tenderness. There is no rebound and no guarding.  Musculoskeletal: Normal range of motion. He exhibits tenderness. He exhibits no edema.       Tenderness to palpation of left lower paraspinal lumbar region/sacral region but no crepitus or skin changes. Full range of motion of bilateral hips and lower extremities with mild pain in left lower back but no crepitus. 5 out of 5 strength of bilateral lower extremities with normal reflexes. Pelvis is stable and nontender.  Neurological: He is alert and oriented to person, place, and time.  Skin: Skin is warm and dry. No rash noted. He is not diaphoretic. No erythema.  Psychiatric: He has a normal mood and affect.    ED Course  Procedures (including critical  care time)  PO percocet  11:27 AM I spoke with Dr. Sharol Given who states he did speak with the patient this morning and states that he told him he would see him in the office tomorrow but patient told him " I can't wait, I'm going to the ER" with Dr. Sharol Given stating he did not request a pain medication refill from him. Dr. Sharol Given reports that patient has had MRI which showed "minimal disc pathology" and that he has chronic back pain but he was unable to differentiate patient's complaint  since he (patient) quickly got off the phone with him.   Labs Reviewed - No data to display No results found.   1. Chronic back pain   2. Chronic hip pain       MDM  Chronic back and hip pain without any red flags for acute back or hip abnormality. No signs or symptoms of central cord compression or cauda equina. Patient has 5 out of 5 strength of bilateral lower extremities which are neurovascularly intact. He is ambulating without difficulty. No skin changes. He's afebrile nontoxic-appearing. Abdomen soft and nontender. Patient has followup appointment with his orthopedic surgeon tomorrow.        Eben Burow, [place] 02/19/12 1136

## 2012-02-19 NOTE — ED Provider Notes (Signed)
Medical screening examination/treatment/procedure(s) were performed by non-physician practitioner and as supervising physician I was immediately available for consultation/collaboration.  Mervin Kung, MD 02/19/12 [phone]

## 2012-02-19 NOTE — Discharge Instructions (Signed)
It is very important to followup with Dr. Sharol Given in his office tomorrow to further discuss your chronic pain management. You may continue take your tramadol as needed for pain. Return to emergency department for emergent changing or worsening symptoms.  Chronic Back Pain When back pain lasts longer than 3 months, it is called chronic back pain.This pain can be frustrating, but the cause of the pain is rarely dangerous.People with chronic back pain often go through certain periods that are more intense (flare-ups). CAUSES Chronic back pain can be caused by wear and tear (degeneration) on different structures in your back. These structures may include bones, ligaments, or discs. This degeneration may result in more pressure being placed on the nerves that travel to your legs and feet. This can lead to pain traveling from the low back down the back of the legs. When pain lasts longer than 3 months, it is not unusual for people to experience anxiety or depression. Anxiety and depression can also contribute to low back pain. TREATMENT  Establish a regular exercise plan. This is critical to improving your functional level.   Have a self-management plan for when you flare-up. Flare-ups rarely require a medical visit. Regular exercise will help reduce the intensity and frequency of your flare-ups.   Manage how you feel about your back pain and the rest of your life. Anxiety, depression, and feeling that you cannot alter your back pain have been shown to make back pain more intense and debilitating.   Medicines should never be your only treatment. They should be used along with other treatments to help you return to a more active lifestyle.   Procedures such as injections or surgery may be helpful but are rarely necessary. You may be able to get the same results with physical therapy or chiropractic care.  HOME CARE INSTRUCTIONS  Avoid bending, heavy lifting, prolonged sitting, and activities which make  the problem worse.   Continue normal activity as much as possible.   Take brief periods of rest throughout the day to reduce your pain during flare-ups.   Follow your back exercise rehabilitation program. This can help reduce symptoms and prevent more pain.   Only take over-the-counter or prescription medicines as directed by your caregiver. Muscle relaxants are sometimes prescribed. Narcotic pain medicine is discouraged for long-term pain, since addiction is a possible outcome.   If you smoke, quit.   Eat healthy foods and maintain a recommended body weight.  SEEK IMMEDIATE MEDICAL CARE IF:   You have weakness or numbness in one of your legs or feet.   You have trouble controlling your bladder or bowels.   You develop nausea, vomiting, abdominal pain, shortness of breath, or fainting.  Document Released: 11/17/2004 Document Revised: 09/29/2011 Document Reviewed: 09/24/2011 [facility] Patient Information 2012 [place].  Chronic Pain Management Managing chronic pain is not easy. The goal is to provide as much pain relief as possible. There are emotional as well as physical problems. Chronic pain may lead to symptoms of depression which magnify those of the pain. Problems may include:  Anxiety.   Sleep disturbances.   Confused thinking.   Feeling cranky.   Fatigue.   Weight gain or loss.  Identify the source of the pain first, if possible. The pain may be masking another problem. Try to find a pain management specialist or clinic. Work with a team to create a treatment plan for you. MEDICATIONS  May include narcotics or opioids. Larger than normal doses may be needed  to control your pain.   Drugs for depression may help.   Over-the-counter medicines may help for some conditions. These drugs may be used along with others for better pain relief.   May be injected into sites such as the spine and joints. Injections may have to be repeated if they wear off.  THERAPY MAY  INCLUDE:  Working with a physical therapist to keep from getting stiff.   Regular, gentle exercise.   Cognitive or behavioral therapy.   Using complementary or integrative medicine such as:   Acupuncture.   Massage, Reiki, or Rolfing.   Aroma, color, light, or sound therapy.   Group support.  FOR MORE INFORMATION [email or web address] American Chronic Pain Association [email or web address] Document Released: 11/17/2004 Document Revised: 09/29/2011 Document Reviewed: 12/27/2007 [facility] Patient Information 2012 [place], [place].

## 2012-02-22 ENCOUNTER — Other Ambulatory Visit: Payer: Self-pay | Admitting: Internal Medicine

## 2012-02-22 ENCOUNTER — Other Ambulatory Visit: Payer: Self-pay | Admitting: Cardiology

## 2012-02-24 ENCOUNTER — Telehealth: Payer: Self-pay | Admitting: Cardiology

## 2012-02-24 NOTE — Telephone Encounter (Signed)
New problem:  Patient calling concerning medication that prescribe by Dr. Meridee Score patient states he still C/O pain on left leg & hip.  Patient contacted Office  on yesterday advise patient to continue to take his muscle relaxer. Patient states that was Dr. Verl Blalock ever contacted from Dr. Sharol Given to discuss his cases.

## 2012-02-24 NOTE — Telephone Encounter (Signed)
Pt would like Dr. Verl Blalock to contact Dr. Sharol Given, if he has not already, about what kind of pain medication he should be prescribed.  He says the muscle relaxers are not doing anything ( prescribed by Dr. Sharol Given). Pt will be seeing Dr. Sharol Given next week. Horton Chin RN

## 2012-02-24 NOTE — Telephone Encounter (Signed)
Pt is out of med, can you refill?

## 2012-02-24 NOTE — Telephone Encounter (Signed)
Has appt 5/28. Creatinine bouncing around so will refill for only 3 months.

## 2012-03-13 ENCOUNTER — Ambulatory Visit: Payer: PRIVATE HEALTH INSURANCE | Admitting: Internal Medicine

## 2012-03-13 ENCOUNTER — Encounter: Payer: Self-pay | Admitting: Internal Medicine

## 2012-03-27 ENCOUNTER — Other Ambulatory Visit: Payer: PRIVATE HEALTH INSURANCE

## 2012-04-02 ENCOUNTER — Telehealth: Payer: Self-pay | Admitting: *Deleted

## 2012-04-02 NOTE — Telephone Encounter (Signed)
RN reviewed the pt's old paper chart (2007-2008) for a record of a colonoscopy.  No record present.  Pt seen at [facility] prior to that time.  Pt states that he had a colonoscopy at [facility].  This RN unable to access E-Chart now.  Spoke with the pt and advised that if Echart could be accessed then, perhaps, the record could be found.  Pt verbalized understanding and will talk with Dr. Stanford Scotland.

## 2012-04-03 ENCOUNTER — Other Ambulatory Visit: Payer: PRIVATE HEALTH INSURANCE

## 2012-04-03 DIAGNOSIS — B20 Human immunodeficiency virus [HIV] disease: Secondary | ICD-10-CM

## 2012-04-04 LAB — HIV-1 RNA QUANT-NO REFLEX-BLD
HIV 1 RNA Quant: <20 copies/mL (ref <20)
HIV-1 RNA Quant, Log: <1.3 {Log} (ref <1.30)

## 2012-04-04 LAB — T-HELPER CELL (CD4) - (RCID CLINIC ONLY)
CD4 % Helper T Cell: 38 % (ref 33–55)
CD4 T Cell Abs: 1110 uL (ref 400–2700)

## 2012-04-10 ENCOUNTER — Ambulatory Visit: Payer: PRIVATE HEALTH INSURANCE | Admitting: Internal Medicine

## 2012-04-16 ENCOUNTER — Telehealth: Payer: Self-pay | Admitting: *Deleted

## 2012-04-16 NOTE — Telephone Encounter (Signed)
Has notice darker stools since on med from Ortho doctor - wanted to see if time to do hemoccult test. Has Appt with ID in AM - will check with them about doing hemoccult - will call 04/17/12 PM if ID is unable. Hilda Blades Dell Hurtubise RN 04/16/12 3:15PM

## 2012-04-17 ENCOUNTER — Ambulatory Visit (INDEPENDENT_AMBULATORY_CARE_PROVIDER_SITE_OTHER): Payer: PRIVATE HEALTH INSURANCE | Admitting: Internal Medicine

## 2012-04-17 ENCOUNTER — Encounter: Payer: Self-pay | Admitting: Internal Medicine

## 2012-04-17 VITALS — BP 126/75 | HR 58 | Temp 98.3°F | Ht 74.0 in | Wt 176.5 lb

## 2012-04-17 DIAGNOSIS — B20 Human immunodeficiency virus [HIV] disease: Secondary | ICD-10-CM

## 2012-04-17 DIAGNOSIS — R195 Other fecal abnormalities: Secondary | ICD-10-CM

## 2012-04-17 DIAGNOSIS — K921 Melena: Secondary | ICD-10-CM

## 2012-04-17 NOTE — Progress Notes (Signed)
[name], male   DOB: 1950-12-24, 61 y.o.   MRN: [id number]     [facility] for Infectious Disease  Patient Active Problem List  Diagnosis  . HIV DISEASE  . SYPHILIS  . HYPERLIPIDEMIA  . GOUT  . TOBACCO ABUSE  . HYPERTENSION  . CORONARY ATHEROSCLEROSIS NATIVE CORONARY ARTERY  . CHRONIC SYSTOLIC HEART FAILURE  . Insomnia  . Hip fracture, left  . Polysubstance abuse  . Bunion of left foot  . Bunion, right foot  . DM II, diet controlled  . Hyperglycemia  . Black stool    Patient's Medications  New Prescriptions   No medications on file  Previous Medications   ALLOPURINOL (ZYLOPRIM) 300 MG TABLET    Take 1 tablet (300 mg total) by mouth daily.   ASPIRIN 81 MG TABLET    Take 81 mg by mouth daily.     ATORVASTATIN (LIPITOR) 40 MG TABLET    Take 1 tablet (40 mg total) by mouth daily.   CARVEDILOL (COREG) 25 MG TABLET    take 1 tablet by mouth twice a day with food   COLCHICINE 0.6 MG TABLET    Take 0.6 mg by mouth continuous as needed. For gout.   ENALAPRIL (VASOTEC) 5 MG TABLET    take 1 tablet by mouth twice a day   LAMIVUDINE-ZIDOVUDINE (COMBIVIR) 150-300 MG PER TABLET    TAKE 1 TABLET BY MOUTH    TWICE A DAY   METFORMIN (GLUCOPHAGE) 500 MG TABLET    Take 1 tablet (500 mg total) by mouth 2 (two) times daily with a meal.   NABUMETONE (RELAFEN) 750 MG TABLET    Take 750 mg by mouth 2 (two) times daily.   NAFTIFINE (NAFTIN) 1 % CREAM    Apply 1 application topically daily as needed. Every time pt washes his feet he applies cream.   NITROGLYCERIN (NITROSTAT) 0.3 MG SL TABLET    Place 1 tablet (0.3 mg total) under the tongue every 5 (five) minutes as needed.   REYATAZ 200 MG CAPSULE    TAKE 2 CAPSULES BY MOUTH  ONCE A DAY IN THE MORNING  Modified Medications   No medications on file  Discontinued Medications   TRAMADOL (ULTRAM) 50 MG TABLET    Take 100 mg by mouth every 6 (six) hours as needed. For pain.    Subjective: Jacob [name] is in for his routine visit. He  denies missing any doses of his Combivir or Kaletra since his last visit. He recently started taking Relafen and it has helped his joint pains. However he has noted some black stools since he started it and was told by his [facility] representative that that could be do to some intestinal bleeding caused by Relafen. He is in the process of changing his primary care provider to Dr. Carrolyn Meiers. He has his first visit in early August.  Objective: Temp: 98.3 F (36.8 C) (06/25 1046) Temp src: Oral (06/25 1046) BP: 126/75 mmHg (06/25 1046) Pulse Rate: 58  (06/25 1046)  General: He is in no distress and in good spirits Skin: No rash Lungs: Clear Cor: Regular S1 and S2 no murmurs Abdomen: Soft and nontender  Lab Results HIV 1 RNA Quant (copies/mL)  Date Value  04/03/2012 <20   09/27/2011 <20   06/21/2011 <20      CD4 T Cell Abs (cmm)  Date Value  04/03/2012 1110   09/27/2011 1390   06/21/2011 630      Assessment:  His HIV infection is under excellent control. I will continue his current antiretroviral regimen  Plan: 1. Continue current antiretroviral regimen 2. Check fecal occult blood test 3. Followup here after blood work in [place] months   [name], MD [facility] for [place] [phone] pager   [phone] cell 04/17/2012, 11:00 AM

## 2012-05-07 ENCOUNTER — Other Ambulatory Visit: Payer: Self-pay | Admitting: Licensed Clinical Social Worker

## 2012-05-07 ENCOUNTER — Encounter: Payer: Self-pay | Admitting: *Deleted

## 2012-05-07 DIAGNOSIS — B20 Human immunodeficiency virus [HIV] disease: Secondary | ICD-10-CM

## 2012-05-07 MED ORDER — ATAZANAVIR SULFATE 200 MG PO CAPS: 400.0000 mg | ORAL_CAPSULE | Freq: Every day | ORAL | Status: DC

## 2012-05-07 MED ORDER — LAMIVUDINE-ZIDOVUDINE 150-300 MG PO TABS: 1.0000 | ORAL_TABLET | Freq: Two times a day (BID) | ORAL | Status: DC

## 2012-05-08 ENCOUNTER — Encounter: Payer: Self-pay | Admitting: Cardiology

## 2012-05-08 ENCOUNTER — Ambulatory Visit (INDEPENDENT_AMBULATORY_CARE_PROVIDER_SITE_OTHER): Payer: PRIVATE HEALTH INSURANCE | Admitting: Cardiology

## 2012-05-08 VITALS — BP 90/58 | HR 68 | Ht 74.0 in | Wt 176.0 lb

## 2012-05-08 DIAGNOSIS — I2510 Atherosclerotic heart disease of native coronary artery without angina pectoris: Secondary | ICD-10-CM

## 2012-05-08 DIAGNOSIS — I5022 Chronic systolic (congestive) heart failure: Secondary | ICD-10-CM

## 2012-05-08 MED ORDER — NITROGLYCERIN 0.3 MG SL SUBL: 0.3000 mg | SUBLINGUAL_TABLET | SUBLINGUAL | Status: DC | PRN

## 2012-05-08 MED ORDER — ENALAPRIL MALEATE 5 MG PO TABS: 5.0000 mg | ORAL_TABLET | Freq: Two times a day (BID) | ORAL | Status: DC

## 2012-05-08 MED ORDER — CARVEDILOL 25 MG PO TABS: 25.0000 mg | ORAL_TABLET | Freq: Two times a day (BID) | ORAL | Status: DC

## 2012-05-08 NOTE — Assessment & Plan Note (Signed)
Stable. No change in medical therapy. 

## 2012-05-08 NOTE — Progress Notes (Signed)
HPI Jacob [name] returns today for evaluation and management his coronary artery disease. He's having no symptoms of angina or ischemia. He denies orthopnea, PND or edema.  He is taking Relafen when necessary for arthritic pain. He wants to know if it's safe with his heart condition.  Past Medical History  Diagnosis Date  . Syphilis, unspecified   . Coronary atherosclerosis of native coronary artery     s/p surgery  . Type II or unspecified type diabetes mellitus without mention of complication, not stated as uncontrolled 2004    Diagnosed in 2004. Came to our clinic in 5/07 and was on Glucotrol XL 10 mg BID and metformin 500 BID. Max A1C on those two meds was 6.9 on 03/10/08. He has been able to be removed from the meds while maintaining a good A1C. He has DM neuropathy (although neuropathy could be multifactorial.)  . Human immunodeficiency virus (HIV) disease   . Allergic rhinitis, cause unspecified   . Gout, unspecified   . Other and unspecified hyperlipidemia   . Unspecified essential hypertension   . Chronic systolic heart failure   . Ischemic cardiomyopathy     EF 30%  . Type II or unspecified type diabetes mellitus with neurological manifestations, not stated as uncontrolled   . Drug abuse     hx; tested for cocaine as recently as 2/08. says he is not using drugs now - avoided defib. for this reason   . Acute maxillary sinusitis   . Backache, unspecified   . Coronary atherosclerosis of artery bypass graft   . Closed fracture of intertrochanteric section of femur   . Chronic kidney disease, unspecified   . Tobacco use disorder   . History of hepatitis C     Current Outpatient Prescriptions  Medication Sig Dispense Refill  . allopurinol (ZYLOPRIM) 300 MG tablet Take 1 tablet (300 mg total) by mouth daily.  30 tablet  6  . aspirin 81 MG tablet Take 81 mg by mouth daily.        [name] atazanavir (REYATAZ) 200 MG capsule Take 2 capsules (400 mg total) by mouth daily with breakfast.  60  capsule  5  . atorvastatin (LIPITOR) 40 MG tablet Take 1 tablet (40 mg total) by mouth daily.  30 tablet  11  . carvedilol (COREG) 25 MG tablet take 1 tablet by mouth twice a day with food  60 tablet  6  . colchicine 0.6 MG tablet Take 0.6 mg by mouth continuous as needed. For gout.      . enalapril (VASOTEC) 5 MG tablet take 1 tablet by mouth twice a day  60 tablet  2  . lamiVUDine-zidovudine (COMBIVIR) 150-300 MG per tablet Take 1 tablet by mouth 2 (two) times daily.  60 tablet  5  . latanoprost (XALATAN) 0.005 % ophthalmic solution Place 1 drop into both eyes daily.      . metFORMIN (GLUCOPHAGE) 500 MG tablet Take 1 tablet (500 mg total) by mouth 2 (two) times daily with a meal.  60 tablet  11  . naftifine (NAFTIN) 1 % cream Apply 1 application topically daily as needed. Every time pt washes his feet he applies cream.      . nitroGLYCERIN (NITROSTAT) 0.3 MG SL tablet Place 1 tablet (0.3 mg total) under the tongue every 5 (five) minutes as needed.  90 tablet  6  . DISCONTD: calcium carbonate (OS-CAL) 600 MG TABS Take 600 mg by mouth 2 (two) times daily with a meal.  Allergies  Allergen Reactions  . Amphetamines Other (See Comments)    unknown    Family History  Problem Relation Age of Onset  . Heart failure Father   . Heart attack Brother   . Drug abuse Brother     History   Social History  . Marital Status: Single    Spouse Name: N/A    Number of Children: N/A  . Years of Education: N/A   Occupational History  . Not on file.   Social History Main Topics  . Smoking status: Current Everyday Smoker -- 0.5 packs/day for 36 years    Types: Cigarettes  . Smokeless tobacco: Never Used   Comment: has a goal of 10/24/11  . Alcohol Use: Yes     drinks beer sometimes  . Drug Use: No     none since 2008 (cocaine use)  . Sexually Active: Not Currently     declined condoms   Other Topics Concern  . Not on file   Social History Narrative   Retired.     ROS ALL  NEGATIVE EXCEPT THOSE NOTED IN HPI  PE  General Appearance: well developed, well nourished in no acute distress HEENT: symmetrical face, PERRLA, good dentition  Neck: no JVD, thyromegaly, or adenopathy, trachea midline Chest: symmetric without deformity Cardiac: PMI non-displaced, RRR, normal S1, S2, no gallop or murmur Lung: clear to ausculation and percussion Vascular: all pulses full without bruits  Abdominal: nondistended, nontender, good bowel sounds, no HSM, no bruits Extremities: no cyanosis, clubbing or edema, no sign of DVT, no varicosities  Skin: normal color, no rashes Neuro: alert and oriented x 3, non-focal Pysch: normal affect  EKG Normal sinus rhythm, ST segment coving with T wave inversion in the lateral leads, no change from previous ECG. BMET    Component Value Date/Time   NA 138 09/27/2011 1009   K 4.2 09/27/2011 1009   CL 104 09/27/2011 1009   CO2 26 09/27/2011 1009   GLUCOSE 135* 09/27/2011 1009   BUN 26* 09/27/2011 1009   CREATININE 1.27 09/27/2011 1009   CREATININE 0.90 07/07/2011 0445   CALCIUM 9.1 09/27/2011 1009   GFRNONAA >60 07/07/2011 0445   GFRAA >60 07/07/2011 0445    Lipid Panel     Component Value Date/Time   CHOL 109 09/27/2011 1009   TRIG 169* 09/27/2011 1009   HDL 36* 09/27/2011 1009   CHOLHDL 3.0 09/27/2011 1009   VLDL 34 09/27/2011 1009   LDLCALC 39 09/27/2011 1009    CBC    Component Value Date/Time   WBC 5.9 09/27/2011 1009   RBC 3.62* 09/27/2011 1009   HGB 13.9 09/27/2011 1009   HCT 38.7* 09/27/2011 1009   PLT 151 09/27/2011 1009   MCV 106.9* 09/27/2011 1009   MCH 38.4* 09/27/2011 1009   MCHC 35.9 09/27/2011 1009   RDW 12.9 09/27/2011 1009   LYMPHSABS 1.2 06/30/2011 0521   MONOABS 0.5 06/30/2011 0521   EOSABS 0.1 06/30/2011 0521   EOSABS 0.2 K/UL 08/14/2006 1039   BASOSABS 0.0 06/30/2011 0521

## 2012-05-08 NOTE — Patient Instructions (Signed)
Stop taking Relafen and replace it with Alleve as need for arthritis.  Your physician has requested that you have an echocardiogram. Echocardiography is a painless test that uses sound waves to create images of your heart. It provides your doctor with information about the size and shape of your heart and how well your heart's chambers and valves are working. This procedure takes approximately one hour. There are no restrictions for this procedure.   Your physician wants you to follow-up in: 6 months with Dr. Verl Blalock. You will receive a reminder letter in the mail two months in advance. If you don't receive a letter, please call our office to schedule the follow-up appointment.

## 2012-05-08 NOTE — Assessment & Plan Note (Addendum)
Stable. No change in medical therapy. Last echo 2011. We'll repeat to assess EF. He has clear substance abuse now and may be a candidate for a defibrillator.

## 2012-05-22 ENCOUNTER — Other Ambulatory Visit (INDEPENDENT_AMBULATORY_CARE_PROVIDER_SITE_OTHER): Payer: PRIVATE HEALTH INSURANCE | Admitting: Internal Medicine

## 2012-05-22 DIAGNOSIS — Z0000 Encounter for general adult medical examination without abnormal findings: Secondary | ICD-10-CM

## 2012-05-22 DIAGNOSIS — Z79899 Other long term (current) drug therapy: Secondary | ICD-10-CM

## 2012-05-22 LAB — POC HEMOCCULT BLD/STL (HOME/3-CARD/SCREEN)
Card #2 Fecal Occult Blod, POC: POSITIVE
Card #3 Fecal Occult Blood, POC: POSITIVE

## 2012-05-23 ENCOUNTER — Telehealth: Payer: Self-pay | Admitting: *Deleted

## 2012-05-23 NOTE — Telephone Encounter (Signed)
Pt has appt on 06/12/12 @ Dr. Baldwin Crown office.  Informed of stool card results.  Pt stated that Dr. Verl Blalock took him off the Relafen at his last OV on 05/08/12.  Pt. Reports that his stools are not as black as before.  Pt to discuss stool card results w/ new PCP.  Pt will call [facility] when he finds out the exact name of the MD he is going to see at [facility] so that patient records can be sent.  RN called [facility] to find out who to forward results.  [facility] will ask for results after the pt is seen for the first time.

## 2012-05-23 NOTE — Telephone Encounter (Addendum)
Message copied by Ileana Roup on Wed May 23, 2012 10:07 AM ------      Message from: [name]      Created: Tue May 22, 2012  3:07 PM       Langley Gauss,            Please call Mr. Bonk and inform him of the Hemoccult test being positive. Also verify that he has an upcoming appointment to establish primary care with Dr. Carrolyn Meiers. If so please forward the results of the Hemoccults to Dr. Forde Dandy. If he does not have an appointment to please let me know. Thanks.             Jenny Reichmann

## 2012-05-24 ENCOUNTER — Telehealth: Payer: Self-pay | Admitting: Cardiology

## 2012-05-24 NOTE — Telephone Encounter (Signed)
After 200p please, has questions re upcoming echo

## 2012-05-24 NOTE — Telephone Encounter (Signed)
Patient is aware that he can eat and take his medicine prior having his 2-D echo.

## 2012-05-29 ENCOUNTER — Other Ambulatory Visit (HOSPITAL_COMMUNITY): Payer: PRIVATE HEALTH INSURANCE

## 2012-05-29 ENCOUNTER — Ambulatory Visit (HOSPITAL_COMMUNITY): Payer: PRIVATE HEALTH INSURANCE | Attending: Cardiology

## 2012-05-29 DIAGNOSIS — I5022 Chronic systolic (congestive) heart failure: Secondary | ICD-10-CM

## 2012-05-29 DIAGNOSIS — I10 Essential (primary) hypertension: Secondary | ICD-10-CM | POA: Insufficient documentation

## 2012-05-29 DIAGNOSIS — I509 Heart failure, unspecified: Secondary | ICD-10-CM | POA: Insufficient documentation

## 2012-05-29 DIAGNOSIS — B1920 Unspecified viral hepatitis C without hepatic coma: Secondary | ICD-10-CM | POA: Insufficient documentation

## 2012-05-29 DIAGNOSIS — I2589 Other forms of chronic ischemic heart disease: Secondary | ICD-10-CM | POA: Insufficient documentation

## 2012-05-29 DIAGNOSIS — F17200 Nicotine dependence, unspecified, uncomplicated: Secondary | ICD-10-CM | POA: Insufficient documentation

## 2012-05-29 DIAGNOSIS — B20 Human immunodeficiency virus [HIV] disease: Secondary | ICD-10-CM | POA: Insufficient documentation

## 2012-05-29 DIAGNOSIS — I2510 Atherosclerotic heart disease of native coronary artery without angina pectoris: Secondary | ICD-10-CM | POA: Insufficient documentation

## 2012-05-29 DIAGNOSIS — E785 Hyperlipidemia, unspecified: Secondary | ICD-10-CM | POA: Insufficient documentation

## 2012-05-29 DIAGNOSIS — E119 Type 2 diabetes mellitus without complications: Secondary | ICD-10-CM | POA: Insufficient documentation

## 2012-05-29 NOTE — Progress Notes (Signed)
Echocardiogram performed.  

## 2012-08-09 ENCOUNTER — Encounter: Payer: Self-pay | Admitting: Internal Medicine

## 2012-09-17 ENCOUNTER — Ambulatory Visit (AMBULATORY_SURGERY_CENTER): Payer: PRIVATE HEALTH INSURANCE

## 2012-09-17 VITALS — Ht 74.0 in | Wt 182.0 lb

## 2012-09-17 DIAGNOSIS — Z1211 Encounter for screening for malignant neoplasm of colon: Secondary | ICD-10-CM

## 2012-09-17 MED ORDER — MOVIPREP 100 G PO SOLR: 1.0000 | Freq: Once | ORAL | Status: DC

## 2012-10-01 ENCOUNTER — Encounter: Payer: Self-pay | Admitting: Internal Medicine

## 2012-10-01 ENCOUNTER — Ambulatory Visit (AMBULATORY_SURGERY_CENTER): Payer: PRIVATE HEALTH INSURANCE | Admitting: Internal Medicine

## 2012-10-01 VITALS — BP 140/80 | HR 55 | Temp 97.5°F | Resp 19 | Ht 74.0 in | Wt 182.0 lb

## 2012-10-01 DIAGNOSIS — D126 Benign neoplasm of colon, unspecified: Secondary | ICD-10-CM

## 2012-10-01 DIAGNOSIS — Z1211 Encounter for screening for malignant neoplasm of colon: Secondary | ICD-10-CM

## 2012-10-01 MED ORDER — SODIUM CHLORIDE 0.9 % IV SOLN: 500.0000 mL | INTRAVENOUS | Status: DC

## 2012-10-01 NOTE — Op Note (Signed)
[facility]  [address]. [place], [address]   COLONOSCOPY PROCEDURE REPORT  PATIENT: Jacob [name], Jacob [name]  MR#: [id number] BIRTHDATE: 03/07/1951 , 61  yrs. old GENDER: Male ENDOSCOPIST: Eustace Quail, MD REFERRED [id number] Holder, D.O. PROCEDURE DATE:  10/01/2012 PROCEDURE:   Colonoscopy with snare polypectomy    x 1 ASA CLASS:   Class III INDICATIONS:Colorectal cancer screening. MEDICATIONS: MAC sedation, administered by CRNA and propofol (Diprivan) 220mg  IV  DESCRIPTION OF PROCEDURE:   After the risks benefits and alternatives of the procedure were thoroughly explained, informed consent was obtained.  A digital rectal exam revealed no abnormalities of the rectum.   The LB [id number]  endoscope was introduced through the anus and advanced to the cecum, which was identified by both the appendix and ileocecal valve. No adverse events experienced.   The quality of the prep was good, using MoviPrep  The instrument was then slowly withdrawn as the colon was fully examined.      COLON FINDINGS: A single polyp measuring 2 mm in size was found in the ascending colon.  A polypectomy was performed with a cold snare.  The resection was complete and the polyp tissue was not retrieved (too tiny).   Moderate diverticulosis was noted in the sigmoid colon.   The colon mucosa was otherwise normal. Retroflexed views revealed no abnormalities. The time to cecum=2 minutes 09 seconds.  Withdrawal time=9 minutes 25 seconds.  The scope was withdrawn and the procedure completed. COMPLICATIONS: There were no complications.  ENDOSCOPIC IMPRESSION: 1.   Single polyp measuring 2 mm in size was found in the ascending colon; polypectomy was performed with a cold snare 2.   Moderate diverticulosis was noted in the sigmoid colon 3.   The colon mucosa was otherwise normal  RECOMMENDATIONS: 1. Continue current colorectal screening recommendations for "routine risk" patients  with a repeat colonoscopy in 10 years.   eSigned:  Eustace Quail, MD 10/01/2012 12:12 PM   cc: The Patient    ; Velna Hatchet, MD   PATIENT NAME:  [name], [name] MR#: [id number]

## 2012-10-01 NOTE — Progress Notes (Signed)
Called to room to assist during endoscopic procedure.  Patient ID and intended procedure confirmed with present staff. Received instructions for my participation in the procedure from the performing physician.  

## 2012-10-01 NOTE — Patient Instructions (Addendum)
Findings:  Polyp, Diverticulosis Recommendations:  Repeat colonoscopy in 10 years.  YOU HAD AN ENDOSCOPIC PROCEDURE TODAY AT [facility]: Refer to the procedure report that was given to you for any specific questions about what was found during the examination.  If the procedure report does not answer your questions, please call your gastroenterologist to clarify.  If you requested that your care partner not be given the details of your procedure findings, then the procedure report has been included in a sealed envelope for you to review at your convenience later.  YOU SHOULD EXPECT: Some feelings of bloating in the abdomen. Passage of more gas than usual.  Walking can help get rid of the air that was put into your GI tract during the procedure and reduce the bloating. If you had a lower endoscopy (such as a colonoscopy or flexible sigmoidoscopy) you may notice spotting of blood in your stool or on the toilet paper. If you underwent a bowel prep for your procedure, then you may not have a normal bowel movement for a few days.  DIET: Your first meal following the procedure should be a light meal and then it is ok to progress to your normal diet.  A half-sandwich or bowl of soup is an example of a good first meal.  Heavy or fried foods are harder to digest and may make you feel nauseous or bloated.  Likewise meals heavy in dairy and vegetables can cause extra gas to form and this can also increase the bloating.  Drink plenty of fluids but you should avoid alcoholic beverages for 24 hours.  ACTIVITY: Your care partner should take you home directly after the procedure.  You should plan to take it easy, moving slowly for the rest of the day.  You can resume normal activity the day after the procedure however you should NOT DRIVE or use heavy machinery for 24 hours (because of the sedation medicines used during the test).    SYMPTOMS TO REPORT IMMEDIATELY: A gastroenterologist can be reached  at any hour.  During normal business hours, 8:30 AM to 5:00 PM Monday through Friday, call [phone].  After hours and on weekends, please call the GI answering service at [phone] who will take a message and have the physician on call contact you.   Following lower endoscopy (colonoscopy or flexible sigmoidoscopy):  Excessive amounts of blood in the stool  Significant tenderness or worsening of abdominal pains  Swelling of the abdomen that is new, acute  Fever of 100F or higher  Following upper endoscopy (EGD)  Vomiting of blood or coffee ground material  New chest pain or pain under the shoulder blades  Painful or persistently difficult swallowing  New shortness of breath  Fever of 100F or higher  Black, tarry-looking stools  FOLLOW UP: If any biopsies were taken you will be contacted by phone or by letter within the next 1-3 weeks.  Call your gastroenterologist if you have not heard about the biopsies in 3 weeks.  Our staff will call the home number listed on your records the next business day following your procedure to check on you and address any questions or concerns that you may have at that time regarding the information given to you following your procedure. This is a courtesy call and so if there is no answer at the home number and we have not heard from you through the emergency physician on call, we will assume that you have returned to  your regular daily activities without incident.  SIGNATURES/CONFIDENTIALITY: You and/or your care partner have signed paperwork which will be entered into your electronic medical record.  These signatures attest to the fact that that the information above on your After Visit Summary has been reviewed and is understood.  Full responsibility of the confidentiality of this discharge information lies with you and/or your care-partner.   Please follow all discharge instructions given to you by the recovery room nurse. If you have any  questions or problems after discharge please call one of the numbers listed above. You will receive a phone call in the am to see how you are doing and answer any questions you may have. Thank you for choosing [facility] for your health care needs.

## 2012-10-01 NOTE — Progress Notes (Signed)
Patient did not experience any of the following events: a burn prior to discharge; a fall within the facility; wrong site/side/patient/procedure/implant event; or a hospital transfer or hospital admission upon discharge from the facility. (G8907) Patient did not have preoperative order for IV antibiotic SSI prophylaxis. (G8918)  

## 2012-10-02 ENCOUNTER — Telehealth: Payer: Self-pay

## 2012-10-02 NOTE — Telephone Encounter (Signed)
  Follow up Call-  Call back number 10/01/2012  Post procedure Call Back phone  # [phone]  Permission to leave phone message Yes     Patient questions:  Do you have a fever, pain , or abdominal swelling? no Pain Score  0 *  Have you tolerated food without any problems? yes  Have you been able to return to your normal activities? yes  Do you have any questions about your discharge instructions: Diet   no Medications  no Follow up visit  no  Do you have questions or concerns about your Care? no  Actions: * If pain score is 4 or above: No action needed, pain <4.

## 2012-10-09 ENCOUNTER — Other Ambulatory Visit (INDEPENDENT_AMBULATORY_CARE_PROVIDER_SITE_OTHER): Payer: PRIVATE HEALTH INSURANCE

## 2012-10-09 DIAGNOSIS — B20 Human immunodeficiency virus [HIV] disease: Secondary | ICD-10-CM

## 2012-10-09 LAB — CBC
HCT: 39 % (ref 39.0–52.0)
Hemoglobin: 13.5 g/dL (ref 13.0–17.0)
MCH: 37.5 pg — ABNORMAL HIGH (ref 26.0–34.0)
MCHC: 34.6 g/dL (ref 30.0–36.0)
RBC: 3.6 MIL/uL — ABNORMAL LOW (ref 4.22–5.81)

## 2012-10-10 LAB — COMPREHENSIVE METABOLIC PANEL
ALT: 35 U/L (ref 0–53)
AST: 36 U/L (ref 0–37)
Alkaline Phosphatase: 72 U/L (ref 39–117)
Sodium: 141 mEq/L (ref 135–145)
Total Bilirubin: 1.3 mg/dL — ABNORMAL HIGH (ref 0.3–1.2)
Total Protein: 6.7 g/dL (ref 6.0–8.3)

## 2012-10-10 LAB — T-HELPER CELL (CD4) - (RCID CLINIC ONLY)
CD4 % Helper T Cell: 41 % (ref 33–55)
CD4 T Cell Abs: 880 uL (ref 400–2700)

## 2012-10-10 LAB — LIPID PANEL: VLDL: 68 mg/dL — ABNORMAL HIGH (ref 0–40)

## 2012-10-11 LAB — HIV-1 RNA QUANT-NO REFLEX-BLD
HIV 1 RNA Quant: <20 copies/mL (ref <20)
HIV-1 RNA Quant, Log: <1.3 {Log} (ref <1.30)

## 2012-10-22 ENCOUNTER — Other Ambulatory Visit: Payer: Self-pay | Admitting: Infectious Diseases

## 2012-10-22 DIAGNOSIS — E118 Type 2 diabetes mellitus with unspecified complications: Secondary | ICD-10-CM

## 2012-10-30 ENCOUNTER — Ambulatory Visit (INDEPENDENT_AMBULATORY_CARE_PROVIDER_SITE_OTHER): Payer: PRIVATE HEALTH INSURANCE | Admitting: Internal Medicine

## 2012-10-30 ENCOUNTER — Encounter: Payer: Self-pay | Admitting: Internal Medicine

## 2012-10-30 VITALS — BP 181/93 | HR 64 | Temp 97.5°F | Ht 74.0 in | Wt 187.5 lb

## 2012-10-30 DIAGNOSIS — B20 Human immunodeficiency virus [HIV] disease: Secondary | ICD-10-CM

## 2012-10-30 MED ORDER — ATAZANAVIR SULFATE 200 MG PO CAPS: 400.0000 mg | ORAL_CAPSULE | Freq: Every day | ORAL | Status: DC

## 2012-10-30 MED ORDER — LAMIVUDINE-ZIDOVUDINE 150-300 MG PO TABS: 1.0000 | ORAL_TABLET | Freq: Two times a day (BID) | ORAL | Status: DC

## 2012-10-30 NOTE — Progress Notes (Signed)
[name], male   DOB: [birth year], 62 y.o.   MRN: [id number]     [facility] for Infectious Disease  Patient Active Problem List  Diagnosis  . HIV DISEASE  . SYPHILIS  . HYPERLIPIDEMIA  . GOUT  . TOBACCO ABUSE  . HYPERTENSION  . CORONARY ATHEROSCLEROSIS NATIVE CORONARY ARTERY  . CHRONIC SYSTOLIC HEART FAILURE  . Insomnia  . Hip fracture, left  . Polysubstance abuse  . Bunion of left foot  . Bunion, right foot  . DM II, diet controlled  . Hyperglycemia  . Black stool  . Type II or unspecified type diabetes mellitus with unspecified complication, not stated as uncontrolled    Patient's Medications  New Prescriptions   No medications on file  Previous Medications   ALLOPURINOL (ZYLOPRIM) 300 MG TABLET    Take 1 tablet (300 mg total) by mouth daily.   ASPIRIN 81 MG TABLET    Take 81 mg by mouth daily.     ATORVASTATIN (LIPITOR) 40 MG TABLET    Take 1 tablet (40 mg total) by mouth daily.   CARVEDILOL (COREG) 25 MG TABLET    Take 1 tablet (25 mg total) by mouth 2 (two) times daily with a meal.   COLCHICINE 0.6 MG TABLET    Take 0.6 mg by mouth continuous as needed. For gout.   GLUCOSAMINE-CHONDROITIN 500-400 MG TABLET    Take 1 tablet by mouth 3 (three) times daily.   LATANOPROST (XALATAN) 0.005 % OPHTHALMIC SOLUTION    Place 1 drop into both eyes daily.   METFORMIN (GLUCOPHAGE) 500 MG TABLET    Take 1 tablet (500 mg total) by mouth 2 (two) times daily with a meal.   NAFTIFINE (NAFTIN) 1 % CREAM    Apply 1 application topically daily as needed. Every time pt washes his feet he applies cream.   NAPROXEN (NAPROSYN) 250 MG TABLET    Take 250 mg by mouth 2 (two) times daily with a meal.   NITROGLYCERIN (NITROSTAT) 0.3 MG SL TABLET    Place 1 tablet (0.3 mg total) under the tongue every 5 (five) minutes as needed.  Modified Medications   Modified Medication Previous Medication   ATAZANAVIR (REYATAZ) 200 MG CAPSULE atazanavir (REYATAZ) 200 MG capsule      Take 2  capsules (400 mg total) by mouth daily with breakfast.    Take 2 capsules (400 mg total) by mouth daily with breakfast.   ENALAPRIL (VASOTEC) 5 MG TABLET enalapril (VASOTEC) 5 MG tablet      Take 10 mg by mouth 2 (two) times daily.    Take 1 tablet (5 mg total) by mouth 2 (two) times daily.   LAMIVUDINE-ZIDOVUDINE (COMBIVIR) 150-300 MG PER TABLET lamiVUDine-zidovudine (COMBIVIR) 150-300 MG per tablet      Take 1 tablet by mouth 2 (two) times daily.    Take 1 tablet by mouth 2 (two) times daily.  Discontinued Medications   No medications on file    Subjective: Rate is in for his routine visit. He recalls missing only one dose of his Reyataz since his last visit. He is feeling well without complaints. He states that he will occasionally take times at night if he has indigestion he takes his Reyataz and Combivir around 6:30 in the morning he thinks he is taking TUMS maybe 2-3 times a past month. He is not on any proton pump inhibitors.  Objective: Temp: 97.5 F (36.4 C) (01/07 0924) Temp src: Oral (01/07 0924) BP:  181/93 mmHg (01/07 0924) Pulse Rate: 64  (01/07 0924)  General: He appears healthy and in no distress Skin: No rash Lungs: Clear Cor: Regular S1 and S2 no murmurs  Lab Results HIV 1 RNA Quant (copies/mL)  Date Value  10/09/2012 <20   04/03/2012 <20   09/27/2011 <20      CD4 T Cell Abs (cmm)  Date Value  10/09/2012 880   04/03/2012 1110   09/27/2011 1390      Assessment: His HIV remains under excellent control. There is a potential interaction between calcium and acids and Reyataz. This could lead to subtherapeutic levels of Reyataz that could promote the development of resistance to his current antiretroviral regimen. If he is taking his medicines at least 12 hours apart and he is only taking TUMS infrequently this should not be a problem. I've asked him to call me if he needs more frequent acid suppressing medications. If so I will change his Reyataz to  Prezista.  Plan: 1. Continue current antiretroviral medications 2. Followup after lab work in [place] months   [name], MD [facility] [place] [phone] pager   [phone] cell 10/30/2012, 9:57 AM

## 2012-11-08 ENCOUNTER — Ambulatory Visit (INDEPENDENT_AMBULATORY_CARE_PROVIDER_SITE_OTHER): Payer: PRIVATE HEALTH INSURANCE | Admitting: Cardiology

## 2012-11-08 ENCOUNTER — Encounter: Payer: Self-pay | Admitting: Cardiology

## 2012-11-08 VITALS — BP 118/80 | HR 65 | Ht 74.0 in | Wt 185.0 lb

## 2012-11-08 DIAGNOSIS — I5022 Chronic systolic (congestive) heart failure: Secondary | ICD-10-CM

## 2012-11-08 DIAGNOSIS — I2510 Atherosclerotic heart disease of native coronary artery without angina pectoris: Secondary | ICD-10-CM

## 2012-11-08 DIAGNOSIS — E785 Hyperlipidemia, unspecified: Secondary | ICD-10-CM

## 2012-11-08 DIAGNOSIS — I10 Essential (primary) hypertension: Secondary | ICD-10-CM

## 2012-11-08 DIAGNOSIS — F17200 Nicotine dependence, unspecified, uncomplicated: Secondary | ICD-10-CM

## 2012-11-08 NOTE — Patient Instructions (Addendum)
Your physician recommends that you continue on your current medications as directed. Please refer to the Current Medication list given to you today.  Your physician wants you to follow-up in: 6 months with Dr. Verl Blalock,. You will receive a reminder letter in the mail two months in advance. If you don't receive a letter, please call our office to schedule the follow-up appointment.

## 2012-11-08 NOTE — Assessment & Plan Note (Signed)
Cholesterol and LDL remarkably low. We'll make no changes in current therapy.

## 2012-11-08 NOTE — Assessment & Plan Note (Signed)
Under good control. No change in treatment.

## 2012-11-08 NOTE — Assessment & Plan Note (Signed)
Continues to be remarkably stable. No change in medical therapy.

## 2012-11-08 NOTE — Progress Notes (Signed)
HPI Jacob [name] returns today for evaluation and management of his coronary artery disease and chronic systolic heart failure.  He's having no angina, orthopnea, PND or edema. His weight has been stable. He continues to smoke about a half-pack cigarettes a day. He is compliant with his meds and is able to get them.  Past Medical History  Diagnosis Date  . Syphilis, unspecified   . Coronary atherosclerosis of native coronary artery     s/p surgery  . Type II or unspecified type diabetes mellitus without mention of complication, not stated as uncontrolled 2004    Diagnosed in 2004. Came to our clinic in 5/07 and was on Glucotrol XL 10 mg BID and metformin 500 BID. Max A1C on those two meds was 6.9 on 03/10/08. He has been able to be removed from the meds while maintaining a good A1C. He has DM neuropathy (although neuropathy could be multifactorial.)  . Human immunodeficiency virus (HIV) disease   . Allergic rhinitis, cause unspecified   . Gout, unspecified   . Other and unspecified hyperlipidemia   . Unspecified essential hypertension   . Chronic systolic heart failure   . Ischemic cardiomyopathy     EF 30%  . Type II or unspecified type diabetes mellitus with neurological manifestations, not stated as uncontrolled(250.60)   . Drug abuse     hx; tested for cocaine as recently as 2/08. says he is not using drugs now - avoided defib. for this reason   . Acute maxillary sinusitis   . Backache, unspecified   . Coronary atherosclerosis of artery bypass graft   . Closed fracture of intertrochanteric section of femur   . Chronic kidney disease, unspecified   . Tobacco use disorder   . History of hepatitis C     Current Outpatient Prescriptions  Medication Sig Dispense Refill  . allopurinol (ZYLOPRIM) 300 MG tablet Take 1 tablet (300 mg total) by mouth daily.  30 tablet  6  . aspirin 81 MG tablet Take 81 mg by mouth daily.        [name] atazanavir (REYATAZ) 200 MG capsule Take 2 capsules (400 mg  total) by mouth daily with breakfast.  60 capsule  11  . atorvastatin (LIPITOR) 40 MG tablet Take 1 tablet (40 mg total) by mouth daily.  30 tablet  11  . Calcium Carbonate Antacid 1177 MG CHEW Chew 1 each by mouth daily.      . carvedilol (COREG) 25 MG tablet Take 1 tablet (25 mg total) by mouth 2 (two) times daily with a meal.  180 tablet  3  . colchicine 0.6 MG tablet Take 0.6 mg by mouth continuous as needed. For gout.      . enalapril (VASOTEC) 10 MG tablet Take 1 tablet by mouth 2 (two) times daily.      . Glucosamine-Chondroit-Vit C-Mn (GLUCOSAMINE CHONDR 1500 COMPLX PO) Take by mouth daily.      [name] lamiVUDine-zidovudine (COMBIVIR) 150-300 MG per tablet Take 1 tablet by mouth 2 (two) times daily.  60 tablet  11  . latanoprost (XALATAN) 0.005 % ophthalmic solution Place 1 drop into both eyes daily.      . metFORMIN (GLUCOPHAGE) 500 MG tablet Take 1 tablet (500 mg total) by mouth 2 (two) times daily with a meal.  60 tablet  11  . naftifine (NAFTIN) 1 % cream Apply 1 application topically daily as needed. Every time pt washes his feet he applies cream.      . naproxen (NAPROSYN)  250 MG tablet Take 250 mg by mouth 2 (two) times daily with a meal.      . nitroGLYCERIN (NITROSTAT) 0.3 MG SL tablet Place 1 tablet (0.3 mg total) under the tongue every 5 (five) minutes as needed.  25 tablet  3  . [DISCONTINUED] calcium carbonate (OS-CAL) 600 MG TABS Take 600 mg by mouth 2 (two) times daily with a meal.          Allergies  Allergen Reactions  . Amphetamines Other (See Comments)    unknown    Family History  Problem Relation Age of Onset  . Heart failure Father   . Heart attack Brother   . Drug abuse Brother   . Colon cancer Neg Hx     History   Social History  . Marital Status: Single    Spouse Name: N/A    Number of Children: N/A  . Years of Education: N/A   Occupational History  . Not on file.   Social History Main Topics  . Smoking status: Current Every Day Smoker -- 0.5  packs/day for 36 years    Types: Cigarettes  . Smokeless tobacco: Never Used  . Alcohol Use: 0.0 oz/week     Comment: 2 - 24 oz beers / week  . Drug Use: No     Comment: none since 2008 (cocaine use)  . Sexually Active: Not Currently     Comment: declined condoms   Other Topics Concern  . Not on file   Social History Narrative   Retired.     ROS ALL NEGATIVE EXCEPT THOSE NOTED IN HPI  PE  General Appearance: well developed, well nourished in no acute distress, HEENT: symmetrical face, PERRLA,   Neck: no JVD, thyromegaly, or adenopathy, trachea midline Chest: symmetric without deformity Cardiac: PMI displaced inferolaterally, RRR, normal S1, S2, no gallop or murmur Lung: clear to ausculation and percussion Vascular: all pulses full without bruits  Abdominal: nondistended, nontender, good bowel sounds, no HSM, no bruits Extremities: no cyanosis, clubbing or edema, no sign of DVT, no varicosities  Skin: normal color, no rashes Neuro: alert and oriented x 3, non-focal Pysch: normal affect  EKG  BMET    Component Value Date/Time   NA 141 10/09/2012 1014   K 4.2 10/09/2012 1014   CL 107 10/09/2012 1014   CO2 23 10/09/2012 1014   GLUCOSE 116* 10/09/2012 1014   BUN 21 10/09/2012 1014   CREATININE 1.18 10/09/2012 1014   CREATININE 0.90 07/07/2011 0445   CALCIUM 8.2* 10/09/2012 1014   GFRNONAA >60 07/07/2011 0445   GFRAA >60 07/07/2011 0445    Lipid Panel     Component Value Date/Time   CHOL 98 10/09/2012 1014   TRIG 342* 10/09/2012 1014   HDL 36* 10/09/2012 1014   CHOLHDL 2.7 10/09/2012 1014   VLDL 68* 10/09/2012 1014   LDLCALC NOT CALC 10/09/2012 1014    CBC    Component Value Date/Time   WBC 4.8 10/09/2012 1014   RBC 3.60* 10/09/2012 1014   HGB 13.5 10/09/2012 1014   HCT 39.0 10/09/2012 1014   PLT 158 10/09/2012 1014   MCV 108.3* 10/09/2012 1014   MCH 37.5* 10/09/2012 1014   MCHC 34.6 10/09/2012 1014   RDW 15.1 10/09/2012 1014   LYMPHSABS 1.2 06/30/2011  0521   MONOABS 0.5 06/30/2011 0521   EOSABS 0.1 06/30/2011 0521   EOSABS 0.2 K/UL 08/14/2006 1039   BASOSABS 0.0 06/30/2011 0521

## 2012-11-08 NOTE — Assessment & Plan Note (Signed)
Stable clinically. Continue current medical therapy.

## 2012-11-08 NOTE — Assessment & Plan Note (Signed)
Encouraged to quit. Do not think he will do so.

## 2012-12-07 ENCOUNTER — Emergency Department (HOSPITAL_COMMUNITY): Payer: PRIVATE HEALTH INSURANCE

## 2012-12-07 ENCOUNTER — Encounter (HOSPITAL_COMMUNITY): Payer: Self-pay | Admitting: *Deleted

## 2012-12-07 ENCOUNTER — Emergency Department (HOSPITAL_COMMUNITY)
Admission: EM | Admit: 2012-12-07 | Discharge: 2012-12-07 | Disposition: A | Discharge status: Home / Self Care | Payer: PRIVATE HEALTH INSURANCE | Attending: Emergency Medicine | Admitting: Emergency Medicine

## 2012-12-07 DIAGNOSIS — Z21 Asymptomatic human immunodeficiency virus [HIV] infection status: Secondary | ICD-10-CM | POA: Insufficient documentation

## 2012-12-07 DIAGNOSIS — N189 Chronic kidney disease, unspecified: Secondary | ICD-10-CM | POA: Insufficient documentation

## 2012-12-07 DIAGNOSIS — F17200 Nicotine dependence, unspecified, uncomplicated: Secondary | ICD-10-CM | POA: Insufficient documentation

## 2012-12-07 DIAGNOSIS — Z8739 Personal history of other diseases of the musculoskeletal system and connective tissue: Secondary | ICD-10-CM | POA: Insufficient documentation

## 2012-12-07 DIAGNOSIS — Y9229 Other specified public building as the place of occurrence of the external cause: Secondary | ICD-10-CM | POA: Insufficient documentation

## 2012-12-07 DIAGNOSIS — E119 Type 2 diabetes mellitus without complications: Secondary | ICD-10-CM | POA: Insufficient documentation

## 2012-12-07 DIAGNOSIS — Z951 Presence of aortocoronary bypass graft: Secondary | ICD-10-CM | POA: Insufficient documentation

## 2012-12-07 DIAGNOSIS — Z7982 Long term (current) use of aspirin: Secondary | ICD-10-CM | POA: Insufficient documentation

## 2012-12-07 DIAGNOSIS — E785 Hyperlipidemia, unspecified: Secondary | ICD-10-CM | POA: Insufficient documentation

## 2012-12-07 DIAGNOSIS — Z79899 Other long term (current) drug therapy: Secondary | ICD-10-CM | POA: Insufficient documentation

## 2012-12-07 DIAGNOSIS — W19XXXA Unspecified fall, initial encounter: Secondary | ICD-10-CM

## 2012-12-07 DIAGNOSIS — S2341XA Sprain of ribs, initial encounter: Secondary | ICD-10-CM

## 2012-12-07 DIAGNOSIS — S239XXA Sprain of unspecified parts of thorax, initial encounter: Secondary | ICD-10-CM | POA: Insufficient documentation

## 2012-12-07 DIAGNOSIS — Z8709 Personal history of other diseases of the respiratory system: Secondary | ICD-10-CM | POA: Insufficient documentation

## 2012-12-07 DIAGNOSIS — I2510 Atherosclerotic heart disease of native coronary artery without angina pectoris: Secondary | ICD-10-CM | POA: Insufficient documentation

## 2012-12-07 DIAGNOSIS — Z8781 Personal history of (healed) traumatic fracture: Secondary | ICD-10-CM | POA: Insufficient documentation

## 2012-12-07 DIAGNOSIS — I129 Hypertensive chronic kidney disease with stage 1 through stage 4 chronic kidney disease, or unspecified chronic kidney disease: Secondary | ICD-10-CM | POA: Insufficient documentation

## 2012-12-07 DIAGNOSIS — W010XXA Fall on same level from slipping, tripping and stumbling without subsequent striking against object, initial encounter: Secondary | ICD-10-CM | POA: Insufficient documentation

## 2012-12-07 DIAGNOSIS — Z8619 Personal history of other infectious and parasitic diseases: Secondary | ICD-10-CM | POA: Insufficient documentation

## 2012-12-07 DIAGNOSIS — Z8679 Personal history of other diseases of the circulatory system: Secondary | ICD-10-CM | POA: Insufficient documentation

## 2012-12-07 DIAGNOSIS — M109 Gout, unspecified: Secondary | ICD-10-CM | POA: Insufficient documentation

## 2012-12-07 DIAGNOSIS — Y939 Activity, unspecified: Secondary | ICD-10-CM | POA: Insufficient documentation

## 2012-12-07 DIAGNOSIS — I25810 Atherosclerosis of coronary artery bypass graft(s) without angina pectoris: Secondary | ICD-10-CM | POA: Insufficient documentation

## 2012-12-07 MED ORDER — OXYCODONE-ACETAMINOPHEN 5-325 MG PO TABS: 1.0000 | ORAL_TABLET | Freq: Four times a day (QID) | ORAL | Status: DC | PRN

## 2012-12-07 MED ORDER — OXYCODONE-ACETAMINOPHEN 5-325 MG PO TABS
2.0000 | ORAL_TABLET | Freq: Once | ORAL | Status: AC
  Administered 2012-12-07: 2 via ORAL
  Filled 2012-12-07: qty 2

## 2012-12-07 NOTE — ED Notes (Addendum)
Pt slipped and fell in a parking lot. States he fell flat on his back but his pain in right anterior chest. States has difficulty breathing. Lung sounds present bilaterally. Severe pain with movement or deep breathing.

## 2012-12-07 NOTE — ED Provider Notes (Signed)
History     CSN: [id number]  Arrival date & time 12/07/12  [id number]   First MD Initiated Contact with Patient 12/07/12 1101      Chief Complaint  Patient presents with  . Fall    (Consider location/radiation/quality/duration/timing/severity/associated sxs/prior treatment) HPI Comments: 62 year old male presents emergency department complaining of pain in his right ribs and mid back after sustaining a fall prior to arrival. Patient was in the [place] parking lot when he slipped and fell landed on his back. Denies hitting his head or loss of consciousness. Describes pain in his ribs and back as sharp, worse when he goes from a laying to a seated position or taking a deep breath rated 9/10. He has not tried any alleviating factors at this time. Denies difficulty breathing, abdominal pain, nausea or vomiting.  Patient is a 62 y.o. male presenting with fall. The history is provided by the patient.  Fall    Past Medical History  Diagnosis Date  . Syphilis, unspecified   . Coronary atherosclerosis of native coronary artery     s/p surgery  . Type II or unspecified type diabetes mellitus without mention of complication, not stated as uncontrolled 2004    Diagnosed in 2004. Came to our clinic in 5/07 and was on Glucotrol XL 10 mg BID and metformin 500 BID. Max A1C on those two meds was 6.9 on 03/10/08. He has been able to be removed from the meds while maintaining a good A1C. He has DM neuropathy (although neuropathy could be multifactorial.)  . Human immunodeficiency virus (HIV) disease   . Allergic rhinitis, cause unspecified   . Gout, unspecified   . Other and unspecified hyperlipidemia   . Unspecified essential hypertension   . Chronic systolic heart failure   . Ischemic cardiomyopathy     EF 30%  . Type II or unspecified type diabetes mellitus with neurological manifestations, not stated as uncontrolled(250.60)   . Drug abuse     hx; tested for cocaine as recently as 2/08. says he is  not using drugs now - avoided defib. for this reason   . Acute maxillary sinusitis   . Backache, unspecified   . Coronary atherosclerosis of artery bypass graft   . Closed fracture of intertrochanteric section of femur   . Chronic kidney disease, unspecified   . Tobacco use disorder   . History of hepatitis C     Past Surgical History  Procedure Laterality Date  . Coronary artery bypass graft    . Left intertrochanteric hip fracture      s/p intermedullary nail placement 2/08  . Left transverse mandibular fracture  10/05  . Laproscopic cholecystectomy  8/07    Family History  Problem Relation Age of Onset  . Heart failure Father   . Heart attack Brother   . Drug abuse Brother   . Colon cancer Neg Hx     History  Substance Use Topics  . Smoking status: Current Every Day Smoker -- 0.50 packs/day for 36 years    Types: Cigarettes  . Smokeless tobacco: Never Used  . Alcohol Use: 0.0 oz/week     Comment: 2 - 24 oz beers / week      Review of Systems  HENT: Negative for neck pain and neck stiffness.   Respiratory: Negative for shortness of breath.   Musculoskeletal: Positive for back pain and arthralgias.  Skin: Negative for wound.  Psychiatric/Behavioral: Negative for confusion.    Allergies  Amphetamines  Home Medications  Current Outpatient Rx  Name  Route  Sig  Dispense  Refill  . allopurinol (ZYLOPRIM) 300 MG tablet   Oral   Take 1 tablet (300 mg total) by mouth daily.   30 tablet   6   . aspirin 81 MG tablet   Oral   Take 81 mg by mouth daily.           [name] atazanavir (REYATAZ) 200 MG capsule   Oral   Take 2 capsules (400 mg total) by mouth daily with breakfast.   60 capsule   11   . atorvastatin (LIPITOR) 40 MG tablet   Oral   Take 1 tablet (40 mg total) by mouth daily.   30 tablet   11   . Calcium Carbonate Antacid 1177 MG CHEW   Oral   Chew 1 each by mouth daily.         . carvedilol (COREG) 25 MG tablet   Oral   Take 1 tablet (25  mg total) by mouth 2 (two) times daily with a meal.   180 tablet   3     Refill Approved   . enalapril (VASOTEC) 10 MG tablet   Oral   Take 1 tablet by mouth 2 (two) times daily.         . Glucosamine-Chondroit-Vit C-Mn (GLUCOSAMINE CHONDR 1500 COMPLX PO)   Oral   Take 2 tablets by mouth daily.          [name] lamiVUDine-zidovudine (COMBIVIR) 150-300 MG per tablet   Oral   Take 1 tablet by mouth 2 (two) times daily.   60 tablet   11   . latanoprost (XALATAN) 0.005 % ophthalmic solution   Both Eyes   Place 1 drop into both eyes daily.         . metFORMIN (GLUCOPHAGE) 500 MG tablet   Oral   Take 1 tablet (500 mg total) by mouth 2 (two) times daily with a meal.   60 tablet   11   . nabumetone (RELAFEN) 750 MG tablet   Oral   Take 750 mg by mouth 2 (two) times daily.         . naftifine (NAFTIN) 1 % cream   Topical   Apply 1 application topically daily as needed. Every time pt washes his feet he applies cream.         . naproxen (NAPROSYN) 250 MG tablet   Oral   Take 250 mg by mouth 2 (two) times daily with a meal.         . nitroGLYCERIN (NITROSTAT) 0.3 MG SL tablet   Sublingual   Place 1 tablet (0.3 mg total) under the tongue every 5 (five) minutes as needed.   25 tablet   3   . colchicine 0.6 MG tablet   Oral   Take 0.6 mg by mouth continuous as needed. For gout.           BP 147/89  Pulse 64  Temp(Src) 97.6 F (36.4 C) (Oral)  Resp 18  SpO2 99%  Physical Exam  Nursing note and vitals reviewed. Constitutional: He is oriented to person, place, and time. He appears well-developed and well-nourished. No distress.  Appears slightly uncomfortable.  HENT:  Head: Normocephalic and atraumatic.  Eyes: Conjunctivae and EOM are normal. Pupils are equal, round, and reactive to light.  Neck: Normal range of motion. Neck supple.  Cardiovascular: Normal rate, regular rhythm, normal heart sounds and intact distal pulses.   Pulmonary/Chest: Effort  normal  and breath sounds normal. No respiratory distress. He has no decreased breath sounds.    Abdominal: Soft. Bowel sounds are normal. There is no tenderness.  Musculoskeletal: Normal range of motion. He exhibits no edema.  Tenderness to palpation of mid paraspinal muscles, more so on right. No bony tenderness of spine.  Neurological: He is alert and oriented to person, place, and time.  Skin: Skin is warm and dry. No bruising and no ecchymosis noted.  Psychiatric: He has a normal mood and affect. His behavior is normal.    ED Course  Procedures (including critical care time)  Labs Reviewed - No data to display Dg Ribs Unilateral W/chest Right  12/07/2012  *RADIOLOGY REPORT*  Clinical Data: Fall, right rib pain  RIGHT RIBS AND CHEST - 3+ VIEW  Comparison: 07/01/2011  Findings: Three views right ribs submitted.  No acute infiltrate or pulmonary edema.  Status post CABG.  Old right rib fractures are again noted.  No acute fracture is identified.  No diagnostic pneumothorax.  IMPRESSION: No acute fracture is identified.  No diagnostic pneumothorax.   Original Report Authenticated By: Lahoma Crocker, M.D.      1. Fall   2. Thoracic sprain   3. Rib sprain       MDM  62 year old male with thoracic sprain and rib contusion. No evidence of acute fracture on x-ray. No pneumothorax. Lung sounds clear. Percocet given in the emergency department. Patient states he has pain medication at home. I advised him to rest, apply ice. Incentive spirometry also given in order to encourage deep breaths. Return precautions discussed. Patient states understanding of plan and is agreeable.        Illene Labrador, PA-C 12/07/12 1152

## 2012-12-07 NOTE — ED Notes (Signed)
Pt here ambulatory by [facility] for fall this am.  Pt has pain in right rib area.  Pt is alert and oriented, did not hit head.  No LOC

## 2012-12-07 NOTE — ED Notes (Signed)
Patient transported to X-ray 

## 2012-12-10 NOTE — ED Provider Notes (Signed)
Medical screening examination/treatment/procedure(s) were performed by non-physician practitioner and as supervising physician I was immediately available for consultation/collaboration.  Virgel Manifold, MD 12/10/12 [phone]

## 2012-12-21 ENCOUNTER — Telehealth: Payer: Self-pay | Admitting: Cardiology

## 2012-12-21 NOTE — Telephone Encounter (Signed)
I spoke with pt about calling his phone service and having them send him a form for this. He will then mail the form to our office to complete. Horton Chin RN

## 2012-12-21 NOTE — Telephone Encounter (Signed)
New problem    Pt need a medical necessaity form sent to the phone company for him to get a phone. Pt would like to speak to you about this.

## 2012-12-31 ENCOUNTER — Telehealth: Payer: Self-pay | Admitting: Cardiology

## 2012-12-31 NOTE — Telephone Encounter (Signed)
New problem    Has a heart monitor machine at home. The company does not have a form to be filled out - need a note stating if something  happen to his phone it will be back on quicker if they know he's on the heart monitor machine.

## 2012-12-31 NOTE — Telephone Encounter (Signed)
Will forward to Horton Chin and Dr. Verl Blalock.  They will both be in the office tomorrow.

## 2013-01-01 NOTE — Telephone Encounter (Signed)
Draft note from computer to allow this. I can sign this.

## 2013-01-02 ENCOUNTER — Telehealth: Payer: Self-pay | Admitting: Cardiology

## 2013-01-02 ENCOUNTER — Encounter: Payer: Self-pay | Admitting: *Deleted

## 2013-01-02 NOTE — Telephone Encounter (Signed)
Letter sent to [employer] per pt request.

## 2013-01-02 NOTE — Telephone Encounter (Signed)
New Problem:    Called in wanting someone to give Jacob [name] a call.  Please call patient back.

## 2013-01-09 ENCOUNTER — Encounter: Payer: Self-pay | Admitting: *Deleted

## 2013-01-10 NOTE — Telephone Encounter (Signed)
Pt is aware letter is ready. Requests that it be faxed to [phone]  Attn: Mamie Laurel RN

## 2013-02-27 ENCOUNTER — Other Ambulatory Visit: Payer: Self-pay | Admitting: Internal Medicine

## 2013-02-27 NOTE — Telephone Encounter (Signed)
Not [facility] pt ; please send back to the pharmacy  Thanks 

## 2013-04-30 ENCOUNTER — Other Ambulatory Visit: Payer: PRIVATE HEALTH INSURANCE

## 2013-04-30 ENCOUNTER — Other Ambulatory Visit (INDEPENDENT_AMBULATORY_CARE_PROVIDER_SITE_OTHER): Payer: PRIVATE HEALTH INSURANCE

## 2013-04-30 DIAGNOSIS — B20 Human immunodeficiency virus [HIV] disease: Secondary | ICD-10-CM

## 2013-04-30 LAB — COMPREHENSIVE METABOLIC PANEL
ALT: 19 U/L (ref 0–53)
AST: 23 U/L (ref 0–37)
Chloride: 109 mEq/L (ref 96–112)
Creat: 1.31 mg/dL (ref 0.50–1.35)
Total Bilirubin: 1.6 mg/dL — ABNORMAL HIGH (ref 0.3–1.2)

## 2013-04-30 LAB — SYPHILIS: RPR W/REFLEX TO RPR TITER AND TREPONEMAL ANTIBODIES, TRADITIONAL SCREENING AND DIAGNOSIS ALGORITHM: RPR Ser Ql: REACTIVE — AB

## 2013-04-30 LAB — RPR TITER: RPR Titer: 1:2 {titer}

## 2013-05-02 ENCOUNTER — Ambulatory Visit (INDEPENDENT_AMBULATORY_CARE_PROVIDER_SITE_OTHER): Payer: PRIVATE HEALTH INSURANCE | Admitting: Cardiology

## 2013-05-02 ENCOUNTER — Encounter: Payer: Self-pay | Admitting: Cardiology

## 2013-05-02 ENCOUNTER — Other Ambulatory Visit: Payer: Self-pay

## 2013-05-02 VITALS — BP 152/80 | HR 67 | Ht 74.0 in | Wt 185.0 lb

## 2013-05-02 DIAGNOSIS — I509 Heart failure, unspecified: Secondary | ICD-10-CM

## 2013-05-02 DIAGNOSIS — E785 Hyperlipidemia, unspecified: Secondary | ICD-10-CM

## 2013-05-02 DIAGNOSIS — I5042 Chronic combined systolic (congestive) and diastolic (congestive) heart failure: Secondary | ICD-10-CM

## 2013-05-02 DIAGNOSIS — I10 Essential (primary) hypertension: Secondary | ICD-10-CM

## 2013-05-02 DIAGNOSIS — I2510 Atherosclerotic heart disease of native coronary artery without angina pectoris: Secondary | ICD-10-CM

## 2013-05-02 DIAGNOSIS — E118 Type 2 diabetes mellitus with unspecified complications: Secondary | ICD-10-CM

## 2013-05-02 LAB — HIV-1 RNA QUANT-NO REFLEX-BLD
HIV 1 RNA Quant: <20 copies/mL (ref <20)
HIV-1 RNA Quant, Log: <1.3 {Log} (ref <1.30)

## 2013-05-02 NOTE — Assessment & Plan Note (Signed)
Stable. No change in medical management.

## 2013-05-02 NOTE — Assessment & Plan Note (Signed)
Stable. Continue secondary preventative therapy. We'll have followup with Dr.Nishan in 6 months.

## 2013-05-02 NOTE — Progress Notes (Signed)
HPI [name] returns today for his history of coronary artery disease, she bypass surgery, history of chronic combined congestive heart failure which has been stable. His last ejection fraction was 45% in August 2013.  Denies any chest pain or angina. He's had no palpitations, presyncope, syncope, orthopnea, PND or edema. He is compliant with his meds. He is no longer doing any substance abuse.  Past Medical History  Diagnosis Date  . Syphilis, unspecified   . Coronary atherosclerosis of native coronary artery     s/p surgery  . Type II or unspecified type diabetes mellitus without mention of complication, not stated as uncontrolled 2004    Diagnosed in 2004. Came to our clinic in 5/07 and was on Glucotrol XL 10 mg BID and metformin 500 BID. Max A1C on those two meds was 6.9 on 03/10/08. He has been able to be removed from the meds while maintaining a good A1C. He has DM neuropathy (although neuropathy could be multifactorial.)  . Human immunodeficiency virus (HIV) disease   . Allergic rhinitis, cause unspecified   . Gout, unspecified   . Other and unspecified hyperlipidemia   . Unspecified essential hypertension   . Chronic systolic heart failure   . Ischemic cardiomyopathy     EF 30%  . Type II or unspecified type diabetes mellitus with neurological manifestations, not stated as uncontrolled(250.60)   . Drug abuse     hx; tested for cocaine as recently as 2/08. says he is not using drugs now - avoided defib. for this reason   . Acute maxillary sinusitis   . Backache, unspecified   . Coronary atherosclerosis of artery bypass graft   . Closed fracture of intertrochanteric section of femur   . Chronic kidney disease, unspecified   . Tobacco use disorder   . History of hepatitis C     Current Outpatient Prescriptions  Medication Sig Dispense Refill  . allopurinol (ZYLOPRIM) 300 MG tablet Take 1 tablet (300 mg total) by mouth daily.  30 tablet  6  . aspirin 81 MG tablet Take 81 mg by  mouth daily.        [name] atazanavir (REYATAZ) 200 MG capsule Take 2 capsules (400 mg total) by mouth daily with breakfast.  60 capsule  11  . atorvastatin (LIPITOR) 40 MG tablet Take 1 tablet (40 mg total) by mouth daily.  30 tablet  11  . Calcium Carbonate Antacid 1177 MG CHEW Chew 1 each by mouth daily.      . carvedilol (COREG) 25 MG tablet Take 1 tablet (25 mg total) by mouth 2 (two) times daily with a meal.  180 tablet  3  . colchicine 0.6 MG tablet Take 0.6 mg by mouth continuous as needed. For gout.      . enalapril (VASOTEC) 10 MG tablet Take 1 tablet by mouth 2 (two) times daily.      . Glucosamine-Chondroit-Vit C-Mn (GLUCOSAMINE CHONDR 1500 COMPLX PO) Take 2 tablets by mouth daily.       [name] lamiVUDine-zidovudine (COMBIVIR) 150-300 MG per tablet Take 1 tablet by mouth 2 (two) times daily.  60 tablet  11  . latanoprost (XALATAN) 0.005 % ophthalmic solution Place 1 drop into both eyes daily.      . nabumetone (RELAFEN) 750 MG tablet Take 750 mg by mouth 2 (two) times daily.      . naftifine (NAFTIN) 1 % cream Apply 1 application topically daily as needed. Every time pt washes his feet he applies cream.      .  naproxen (NAPROSYN) 250 MG tablet Take 250 mg by mouth 2 (two) times daily with a meal.      . nitroGLYCERIN (NITROSTAT) 0.3 MG SL tablet Place 1 tablet (0.3 mg total) under the tongue every 5 (five) minutes as needed.  25 tablet  3  . oxyCODONE-acetaminophen (PERCOCET) 5-325 MG per tablet Take 1-2 tablets by mouth every 6 (six) hours as needed for pain.  10 tablet  0  . Probiotic Product (ALIGN PO) Take by mouth as directed.      . [DISCONTINUED] calcium carbonate (OS-CAL) 600 MG TABS Take 600 mg by mouth 2 (two) times daily with a meal.         No current facility-administered medications for this visit.    Allergies  Allergen Reactions  . Amphetamines Other (See Comments)    unknown    Family History  Problem Relation Age of Onset  . Heart failure Father   . Heart attack  Brother   . Drug abuse Brother   . Colon cancer Neg Hx     History   Social History  . Marital Status: Single    Spouse Name: N/A    Number of Children: N/A  . Years of Education: N/A   Occupational History  . Not on file.   Social History Main Topics  . Smoking status: Current Every Day Smoker -- 0.50 packs/day for 36 years    Types: Cigarettes  . Smokeless tobacco: Never Used  . Alcohol Use: 0.0 oz/week     Comment: 2 - 24 oz beers / week  . Drug Use: No     Comment: none since 2008 (cocaine use)  . Sexually Active: Not Currently     Comment: declined condoms   Other Topics Concern  . Not on file   Social History Narrative   Retired.     ROS ALL NEGATIVE EXCEPT THOSE NOTED IN HPI  PE  General Appearance: well developed, well nourished in no acute distress HEENT: symmetrical face, PERRLA, wearing glasses  Neck: no JVD, thyromegaly, or adenopathy, trachea midline Chest: symmetric without deformity Cardiac: PMI non-displaced, RRR, normal S1, S2, no gallop or murmur Lung: clear to ausculation and percussion Vascular: all pulses full without bruits  Abdominal: nondistended, nontender, good bowel sounds, no HSM, no bruits Extremities: no cyanosis, clubbing or edema, no sign of DVT, no varicosities  Skin: normal color, no rashes Neuro: alert and oriented x 3, non-focal Pysch: normal affect  EKG Normal sinus rhythm, inferior Aylla Huffine infarct pattern, ST segment changes anterolaterally. This is a stable pattern from last year. BMET    Component Value Date/Time   NA 140 04/30/2013 1417   K 4.2 04/30/2013 1417   CL 109 04/30/2013 1417   CO2 21 04/30/2013 1417   GLUCOSE 120* 04/30/2013 1417   BUN 19 04/30/2013 1417   CREATININE 1.31 04/30/2013 1417   CREATININE 0.90 07/07/2011 0445   CALCIUM 8.8 04/30/2013 1417   GFRNONAA >60 07/07/2011 0445   GFRAA >60 07/07/2011 0445    Lipid Panel     Component Value Date/Time   CHOL 98 10/09/2012 1014   TRIG 342* 10/09/2012 1014   HDL 36*  10/09/2012 1014   CHOLHDL 2.7 10/09/2012 1014   VLDL 68* 10/09/2012 1014   LDLCALC NOT CALC 10/09/2012 1014    CBC    Component Value Date/Time   WBC 4.8 10/09/2012 1014   RBC 3.60* 10/09/2012 1014   HGB 13.5 10/09/2012 1014   HCT 39.0 10/09/2012 1014  PLT 158 10/09/2012 1014   MCV 108.3* 10/09/2012 1014   MCH 37.5* 10/09/2012 1014   MCHC 34.6 10/09/2012 1014   RDW 15.1 10/09/2012 1014   LYMPHSABS 1.2 06/30/2011 0521   MONOABS 0.5 06/30/2011 0521   EOSABS 0.1 06/30/2011 0521   EOSABS 0.2 K/UL 08/14/2006 1039   BASOSABS 0.0 06/30/2011 0521

## 2013-05-02 NOTE — Patient Instructions (Addendum)
Your physician wants you to follow-up in: 6 months with Dr. Jenkins Rouge.  You will receive a reminder letter in the mail two months in advance. If you don't receive a letter, please call our office to schedule the follow-up appointment.  Your physician recommends that you continue on your current medications as directed. Please refer to the Current Medication list given to you today.

## 2013-05-14 ENCOUNTER — Encounter: Payer: Self-pay | Admitting: Internal Medicine

## 2013-05-14 ENCOUNTER — Ambulatory Visit (INDEPENDENT_AMBULATORY_CARE_PROVIDER_SITE_OTHER): Payer: PRIVATE HEALTH INSURANCE | Admitting: Internal Medicine

## 2013-05-14 VITALS — BP 138/83 | HR 57 | Temp 98.5°F | Ht 74.0 in | Wt 185.0 lb

## 2013-05-14 DIAGNOSIS — B20 Human immunodeficiency virus [HIV] disease: Secondary | ICD-10-CM

## 2013-05-14 NOTE — Progress Notes (Signed)
[name], male   DOB: [birth year], 62 y.o.   MRN: [id number]          [facility]  Patient Active Problem List   Diagnosis Date Noted  . Type II or unspecified type diabetes mellitus with unspecified complication, not stated as uncontrolled 10/22/2012  . Black stool 04/17/2012  . Hyperglycemia 01/22/2012  . DM II, diet controlled 12/30/2011  . Bunion of left foot 11/25/2011  . Bunion, right foot 11/25/2011  . Polysubstance abuse 07/28/2011  . Hip fracture, left 04/04/2011  . Insomnia 02/18/2011  . CORONARY ATHEROSCLEROSIS NATIVE CORONARY ARTERY 04/20/2009  . Chronic combined systolic and diastolic congestive heart failure 04/20/2009  . HYPERLIPIDEMIA 11/20/2006  . GOUT 11/20/2006  . TOBACCO ABUSE 11/20/2006  . HYPERTENSION 11/20/2006  . HIV DISEASE 09/02/2006  . SYPHILIS 09/02/2006    Patient's Medications  New Prescriptions   No medications on file  Previous Medications   ALLOPURINOL (ZYLOPRIM) 300 MG TABLET    Take 1 tablet (300 mg total) by mouth daily.   ASPIRIN 81 MG TABLET    Take 81 mg by mouth daily.     ATAZANAVIR (REYATAZ) 200 MG CAPSULE    Take 2 capsules (400 mg total) by mouth daily with breakfast.   ATORVASTATIN (LIPITOR) 40 MG TABLET    Take 1 tablet (40 mg total) by mouth daily.   CALCIUM CARBONATE ANTACID 1177 MG CHEW    Chew 1 each by mouth daily.   CARVEDILOL (COREG) 25 MG TABLET    Take 1 tablet (25 mg total) by mouth 2 (two) times daily with a meal.   COLCHICINE 0.6 MG TABLET    Take 0.6 mg by mouth continuous as needed. For gout.   ENALAPRIL (VASOTEC) 10 MG TABLET    Take 1 tablet by mouth 2 (two) times daily.   GLUCOSAMINE-CHONDROIT-VIT C-MN (GLUCOSAMINE CHONDR 1500 COMPLX PO)    Take 2 tablets by mouth daily.    LAMIVUDINE-ZIDOVUDINE (COMBIVIR) 150-300 MG PER TABLET    Take 1 tablet by mouth 2 (two) times daily.   LATANOPROST (XALATAN) 0.005 % OPHTHALMIC SOLUTION    Place 1 drop into both eyes daily.   METFORMIN (GLUCOPHAGE) 500 MG TABLET    Take 500 mg by mouth 2 (two) times daily with a meal.   NAFTIFINE (NAFTIN) 1 % CREAM    Apply 1 application topically daily as needed. Every time pt washes his feet he applies cream.   NAPROXEN (NAPROSYN) 250 MG TABLET    Take 250 mg by mouth 2 (two) times daily with a meal.   NITROGLYCERIN (NITROSTAT) 0.3 MG SL TABLET    Place 1 tablet (0.3 mg total) under the tongue every 5 (five) minutes as needed.   PROBIOTIC PRODUCT (ALIGN PO)    Take by mouth as directed.  Modified Medications   No medications on file  Discontinued Medications   NABUMETONE (RELAFEN) 750 MG TABLET    Take 750 mg by mouth 2 (two) times daily.   OXYCODONE-ACETAMINOPHEN (PERCOCET) 5-325 MG PER TABLET    Take 1-2 tablets by mouth every 6 (six) hours as needed for pain.    Subjective: Jacob [name] is in for his routine visit. He has not missed any doses of his Combivir and Reyataz. He is trying to quit smoking cigarettes.  Past Medical History  Diagnosis Date  . Syphilis, unspecified   . Coronary atherosclerosis of native coronary artery     s/p surgery  . Type II  or unspecified type diabetes mellitus without mention of complication, not stated as uncontrolled 2004    Diagnosed in 2004. Came to our clinic in 5/07 and was on Glucotrol XL 10 mg BID and metformin 500 BID. Max A1C on those two meds was 6.9 on 03/10/08. He has been able to be removed from the meds while maintaining a good A1C. He has DM neuropathy (although neuropathy could be multifactorial.)  . Human immunodeficiency virus (HIV) disease   . Allergic rhinitis, cause unspecified   . Gout, unspecified   . Other and unspecified hyperlipidemia   . Unspecified essential hypertension   . Chronic systolic heart failure   . Ischemic cardiomyopathy     EF 30%  . Type II or unspecified type diabetes mellitus with neurological manifestations, not stated as uncontrolled(250.60)   . Drug abuse     hx; tested for cocaine as recently as  2/08. says he is not using drugs now - avoided defib. for this reason   . Acute maxillary sinusitis   . Backache, unspecified   . Coronary atherosclerosis of artery bypass graft   . Closed fracture of intertrochanteric section of femur   . Chronic kidney disease, unspecified   . Tobacco use disorder   . History of hepatitis C     History  Substance Use Topics  . Smoking status: Current Every Day Smoker -- 0.50 packs/day for 36 years    Types: Cigarettes  . Smokeless tobacco: Never Used  . Alcohol Use: 0.0 oz/week     Comment: 2 - 24 oz beers / week    Family History  Problem Relation Age of Onset  . Heart failure Father   . Heart attack Brother   . Drug abuse Brother   . Colon cancer Neg Hx     Allergies  Allergen Reactions  . Amphetamines Other (See Comments)    unknown    Objective: Temp: 98.5 F (36.9 C) (07/22 0926) Temp src: Oral (07/22 0926) BP: 138/83 mmHg (07/22 0926) Pulse Rate: 57 (07/22 0926)  General: He is in good spirits Oral: No oropharyngeal lesions Skin: No rash Lungs: Clear Cor: Regular S1 and S2 no murmurs  Lab Results HIV 1 RNA Quant (copies/mL)  Date Value  04/30/2013 <20   10/09/2012 <20   04/03/2012 <20      CD4 T Cell Abs (cmm)  Date Value  10/09/2012 880   04/03/2012 1110   09/27/2011 1390      Lab Results  Component Value Date   WBC 4.8 10/09/2012   HGB 13.5 10/09/2012   HCT 39.0 10/09/2012   MCV 108.3* 10/09/2012   PLT 158 10/09/2012   BMET    Component Value Date/Time   NA 140 04/30/2013 1417   K 4.2 04/30/2013 1417   CL 109 04/30/2013 1417   CO2 21 04/30/2013 1417   GLUCOSE 120* 04/30/2013 1417   BUN 19 04/30/2013 1417   CREATININE 1.31 04/30/2013 1417   CREATININE 0.90 07/07/2011 0445   CALCIUM 8.8 04/30/2013 1417   GFRNONAA >60 07/07/2011 0445   GFRAA >60 07/07/2011 0445   Lab Results  Component Value Date   ALT 19 04/30/2013   AST 23 04/30/2013   ALKPHOS 91 04/30/2013   BILITOT 1.6* 04/30/2013   Lab Results  Component Value  Date   CHOL 98 10/09/2012   HDL 36* 10/09/2012   LDLCALC NOT CALC 10/09/2012   TRIG 342* 10/09/2012   CHOLHDL 2.7 10/09/2012   Assessment: His viral load remains  undetectable and his current regimen. He did not have CD4 count but that is of no concern since his CD4 was 880 late last year and should still be well up in the normal range as long as his virus is suppressed. I talked to him about changing to a simpler, once daily regimen but he prefers to stay on his current regimen.  Plan: 1. Continue Combivir Reyataz 2. Followup after lab work in [place] months   [name], MD [facility] for [place] [phone] pager   [phone] cell 05/14/2013, 9:48 AM

## 2013-06-04 ENCOUNTER — Other Ambulatory Visit: Payer: Self-pay | Admitting: *Deleted

## 2013-06-04 MED ORDER — CARVEDILOL 25 MG PO TABS: 25.0000 mg | ORAL_TABLET | Freq: Two times a day (BID) | ORAL | Status: DC

## 2013-07-02 ENCOUNTER — Ambulatory Visit (HOSPITAL_COMMUNITY)
Admission: RE | Admit: 2013-07-02 | Discharge: 2013-07-02 | Disposition: A | Discharge status: Home / Self Care | Payer: PRIVATE HEALTH INSURANCE | Source: Ambulatory Visit | Attending: General Surgery | Admitting: General Surgery

## 2013-07-02 ENCOUNTER — Other Ambulatory Visit (HOSPITAL_BASED_OUTPATIENT_CLINIC_OR_DEPARTMENT_OTHER): Payer: Self-pay | Admitting: General Surgery

## 2013-07-02 ENCOUNTER — Encounter (HOSPITAL_BASED_OUTPATIENT_CLINIC_OR_DEPARTMENT_OTHER): Payer: PRIVATE HEALTH INSURANCE | Attending: General Surgery

## 2013-07-02 DIAGNOSIS — Z21 Asymptomatic human immunodeficiency virus [HIV] infection status: Secondary | ICD-10-CM | POA: Insufficient documentation

## 2013-07-02 DIAGNOSIS — E1169 Type 2 diabetes mellitus with other specified complication: Secondary | ICD-10-CM | POA: Insufficient documentation

## 2013-07-02 DIAGNOSIS — M869 Osteomyelitis, unspecified: Secondary | ICD-10-CM

## 2013-07-02 DIAGNOSIS — I10 Essential (primary) hypertension: Secondary | ICD-10-CM | POA: Insufficient documentation

## 2013-07-02 DIAGNOSIS — Z7982 Long term (current) use of aspirin: Secondary | ICD-10-CM | POA: Insufficient documentation

## 2013-07-02 DIAGNOSIS — M1710 Unilateral primary osteoarthritis, unspecified knee: Secondary | ICD-10-CM | POA: Insufficient documentation

## 2013-07-02 DIAGNOSIS — X58XXXA Exposure to other specified factors, initial encounter: Secondary | ICD-10-CM | POA: Insufficient documentation

## 2013-07-02 DIAGNOSIS — F17200 Nicotine dependence, unspecified, uncomplicated: Secondary | ICD-10-CM | POA: Insufficient documentation

## 2013-07-02 DIAGNOSIS — Z79899 Other long term (current) drug therapy: Secondary | ICD-10-CM | POA: Insufficient documentation

## 2013-07-02 DIAGNOSIS — I70209 Unspecified atherosclerosis of native arteries of extremities, unspecified extremity: Secondary | ICD-10-CM | POA: Insufficient documentation

## 2013-07-02 DIAGNOSIS — E785 Hyperlipidemia, unspecified: Secondary | ICD-10-CM | POA: Insufficient documentation

## 2013-07-02 DIAGNOSIS — S81009A Unspecified open wound, unspecified knee, initial encounter: Secondary | ICD-10-CM | POA: Insufficient documentation

## 2013-07-02 DIAGNOSIS — L97309 Non-pressure chronic ulcer of unspecified ankle with unspecified severity: Secondary | ICD-10-CM | POA: Insufficient documentation

## 2013-07-02 DIAGNOSIS — M1120 Other chondrocalcinosis, unspecified site: Secondary | ICD-10-CM | POA: Insufficient documentation

## 2013-07-03 NOTE — H&P (Signed)
Jacob [name], KNEIFL NO.:  [id number]  MEDICAL RECORD NO.:  [id number]  LOCATION:  XRAY                         FACILITY:  [facility]  PHYSICIAN:  Elesa Hacker, M.D.        DATE OF BIRTH:  02/10/1951  DATE OF ADMISSION:  07/02/2013 DATE OF DISCHARGE:  07/02/2013                             HISTORY & PHYSICAL   CHIEF COMPLAINT:  Wound, left leg.  HISTORY OF PRESENT ILLNESS:  This patient has had a wound on and off in the same position above the left lateral malleolus for years.  This present wound opened spontaneously several months ago, it has not been particularly treated.  He is putting Bactroban on it.  PAST MEDICAL HISTORY:  Significant for diabetes type 2.  He is HIV positive.  Has hypertension, hyperlipidemia, and smokes half-pack cigarettes a day.  PAST SURGICAL HISTORY:  He has had hip surgery, gallstone surgery, and open heart surgery in 2006.  ALCOHOL:  Occasionally.  MEDICATIONS:  Allopurinol, aspirin, Reyataz, Lipitor, Coreg, colchicine, Vasotec, glucosamine chondroitin, Combivir, Xalatan, metformin, naproxen, and Bactroban cream.  ALLERGIES:  AMPHETAMINES.  REVIEW OF SYSTEMS:  Essentially as above.  PHYSICAL EXAMINATION:  VITAL SIGNS:  Temperature 98, respirations 16, pulse 65, blood pressure 114/71. GENERAL APPEARANCE:  Well developed, well nourished, no distress. NECK:  Supple. CHEST:  Clear. HEART:  Regular rhythm. EXTREMITIES:  Examination of the left lower extremity reveals good peripheral pulses.  ABI is 1.1.  There is no clear-cut loss of sensation.  There is a 2.1 x 1.5 x 0.2 wound just above the left lateral malleolus.  There is no tenderness or redness.  There is some overhanging edges.  The base is shaggy and tan colored.  IMPRESSION:  Diabetic wound, left lower extremity.  PLAN:  We will start with Santyl and Hydrogel.  I will biopsy this early to rule out tumor.     Elesa Hacker, M.D.     RA/MEDQ  D:  07/02/2013  T:   07/02/2013  Job:  [id number]

## 2013-07-30 ENCOUNTER — Encounter (HOSPITAL_BASED_OUTPATIENT_CLINIC_OR_DEPARTMENT_OTHER): Payer: PRIVATE HEALTH INSURANCE | Attending: General Surgery

## 2013-07-30 DIAGNOSIS — Z21 Asymptomatic human immunodeficiency virus [HIV] infection status: Secondary | ICD-10-CM | POA: Insufficient documentation

## 2013-07-30 DIAGNOSIS — L97809 Non-pressure chronic ulcer of other part of unspecified lower leg with unspecified severity: Secondary | ICD-10-CM | POA: Insufficient documentation

## 2013-07-30 DIAGNOSIS — E1169 Type 2 diabetes mellitus with other specified complication: Secondary | ICD-10-CM | POA: Insufficient documentation

## 2013-08-20 ENCOUNTER — Ambulatory Visit: Payer: Self-pay | Admitting: Podiatry

## 2013-08-23 ENCOUNTER — Emergency Department (INDEPENDENT_AMBULATORY_CARE_PROVIDER_SITE_OTHER): Payer: PRIVATE HEALTH INSURANCE

## 2013-08-23 ENCOUNTER — Encounter (HOSPITAL_COMMUNITY): Payer: Self-pay | Admitting: Emergency Medicine

## 2013-08-23 ENCOUNTER — Emergency Department (INDEPENDENT_AMBULATORY_CARE_PROVIDER_SITE_OTHER)
Admission: EM | Admit: 2013-08-23 | Discharge: 2013-08-23 | Disposition: A | Discharge status: Home / Self Care | Payer: PRIVATE HEALTH INSURANCE | Source: Home / Self Care

## 2013-08-23 DIAGNOSIS — R05 Cough: Secondary | ICD-10-CM

## 2013-08-23 LAB — POCT URINALYSIS DIP (DEVICE)
Glucose, UA: NEGATIVE mg/dL
Ketones, ur: NEGATIVE mg/dL
Leukocytes, UA: NEGATIVE
Nitrite: NEGATIVE
pH: 5.5 (ref 5.0–8.0)

## 2013-08-23 NOTE — ED Provider Notes (Signed)
CSN: [id number]     Arrival date & time 08/23/13  1004 History   First MD Initiated Contact with Patient 08/23/13 1101     Chief Complaint  Patient presents with  . Abdominal Pain   (Consider location/radiation/quality/duration/timing/severity/associated sxs/prior Treatment) HPI Comments: 62 year old male presents with a two-day history of dry cough. Denies fever, chest pain or shortness of breath. Is associated with primarily night sweats. He has occasionally had daytime sweats with minimal exertion. Has a history of HIV positivity, type 2 diabetes mellitus, chronic kidney disease, tobacco use disorder, hepatitis C, chronic systolic heart failure, ischemic cardiomyopathy, syphilis in drug abuse.   Past Medical History  Diagnosis Date  . Syphilis, unspecified   . Coronary atherosclerosis of native coronary artery     s/p surgery  . Type II or unspecified type diabetes mellitus without mention of complication, not stated as uncontrolled 2004    Diagnosed in 2004. Came to our clinic in 5/07 and was on Glucotrol XL 10 mg BID and metformin 500 BID. Max A1C on those two meds was 6.9 on 03/10/08. He has been able to be removed from the meds while maintaining a good A1C. He has DM neuropathy (although neuropathy could be multifactorial.)  . Human immunodeficiency virus (HIV) disease   . Allergic rhinitis, cause unspecified   . Gout, unspecified   . Other and unspecified hyperlipidemia   . Unspecified essential hypertension   . Chronic systolic heart failure   . Ischemic cardiomyopathy     EF 30%  . Type II or unspecified type diabetes mellitus with neurological manifestations, not stated as uncontrolled(250.60)   . Drug abuse     hx; tested for cocaine as recently as 2/08. says he is not using drugs now - avoided defib. for this reason   . Acute maxillary sinusitis   . Backache, unspecified   . Coronary atherosclerosis of artery bypass graft   . Closed fracture of intertrochanteric section  of femur   . Chronic kidney disease, unspecified   . Tobacco use disorder   . History of hepatitis C    Past Surgical History  Procedure Laterality Date  . Coronary artery bypass graft    . Left intertrochanteric hip fracture      s/p intermedullary nail placement 2/08  . Left transverse mandibular fracture  10/05  . Laproscopic cholecystectomy  8/07   Family History  Problem Relation Age of Onset  . Heart failure Father   . Heart attack Brother   . Drug abuse Brother   . Colon cancer Neg Hx    History  Substance Use Topics  . Smoking status: Current Every Day Smoker -- 0.50 packs/day for 36 years    Types: Cigarettes  . Smokeless tobacco: Never Used  . Alcohol Use: 0.0 oz/week     Comment: 2 - 24 oz beers / week    Review of Systems  Constitutional: Positive for fever, activity change and appetite change. Negative for fatigue.  HENT: Negative for congestion, nosebleeds, postnasal drip, sinus pressure, sore throat and trouble swallowing.   Respiratory: Positive for cough. Negative for chest tightness, shortness of breath and wheezing.   Cardiovascular: Negative.   Gastrointestinal: Negative.   Genitourinary:       Orange urine  Musculoskeletal: Negative.   Neurological: Negative for speech difficulty and headaches.    Allergies  Amphetamines  Home Medications   Current Outpatient Rx  Name  Route  Sig  Dispense  Refill  . allopurinol (ZYLOPRIM) 300 MG  tablet   Oral   Take 1 tablet (300 mg total) by mouth daily.   30 tablet   6   . aspirin 81 MG tablet   Oral   Take 81 mg by mouth daily.           [name] atazanavir (REYATAZ) 200 MG capsule   Oral   Take 2 capsules (400 mg total) by mouth daily with breakfast.   60 capsule   11   . atorvastatin (LIPITOR) 40 MG tablet   Oral   Take 1 tablet (40 mg total) by mouth daily.   30 tablet   11   . Calcium Carbonate Antacid 1177 MG CHEW   Oral   Chew 1 each by mouth daily.         . carvedilol (COREG) 25  MG tablet   Oral   Take 1 tablet (25 mg total) by mouth 2 (two) times daily with a meal.   180 tablet   1   . colchicine 0.6 MG tablet   Oral   Take 0.6 mg by mouth continuous as needed. For gout.         . enalapril (VASOTEC) 10 MG tablet   Oral   Take 1 tablet by mouth 2 (two) times daily.         . Glucosamine-Chondroit-Vit C-Mn (GLUCOSAMINE CHONDR 1500 COMPLX PO)   Oral   Take 2 tablets by mouth daily.          [name] lamiVUDine-zidovudine (COMBIVIR) 150-300 MG per tablet   Oral   Take 1 tablet by mouth 2 (two) times daily.   60 tablet   11   . latanoprost (XALATAN) 0.005 % ophthalmic solution   Both Eyes   Place 1 drop into both eyes daily.         . metFORMIN (GLUCOPHAGE) 500 MG tablet   Oral   Take 500 mg by mouth 2 (two) times daily with a meal.         . naftifine (NAFTIN) 1 % cream   Topical   Apply 1 application topically daily as needed. Every time pt washes his feet he applies cream.         . naproxen (NAPROSYN) 250 MG tablet   Oral   Take 250 mg by mouth 2 (two) times daily with a meal.         . nitroGLYCERIN (NITROSTAT) 0.3 MG SL tablet   Sublingual   Place 1 tablet (0.3 mg total) under the tongue every 5 (five) minutes as needed.   25 tablet   3   . Probiotic Product (ALIGN PO)   Oral   Take by mouth as directed.          BP 119/61  Pulse 74  Temp(Src) 98 F (36.7 C) (Oral)  Resp 16  SpO2 100% Physical Exam  Nursing note and vitals reviewed. Constitutional: He is oriented to person, place, and time. He appears well-developed and well-nourished. No distress.  HENT:  Right Ear: External ear normal.  Left Ear: External ear normal.  Minor erythema, mild, clear PND  Neck: Normal range of motion. Neck supple.  Cardiovascular: Normal rate, regular rhythm and normal heart sounds.   Pulmonary/Chest: Effort normal.  Rhonchi, faint crackles L lung mid and lower fields  Abdominal: Soft. He exhibits no distension. There is no  tenderness. There is no rebound and no guarding.  Musculoskeletal: He exhibits no edema and no tenderness.  Lymphadenopathy:    He  has no cervical adenopathy.  Neurological: He is alert and oriented to person, place, and time. He exhibits normal muscle tone.  Skin: Skin is warm and dry. He is not diaphoretic.    ED Course  Procedures (including critical care time) Labs Review Labs Reviewed  POCT URINALYSIS DIP (DEVICE) - Abnormal; Notable for the following:    Bilirubin Urine SMALL (*)    Hgb urine dipstick LARGE (*)    Protein, ur 100 (*)    All other components within normal limits   Imaging Review Dg Chest 2 View  08/23/2013   CLINICAL DATA:  Cough and congestion. Fever and headaches.  EXAM: CHEST  2 VIEW  COMPARISON:  Chest and rib films 12/07/2012.  FINDINGS: Significant callus formation is present over the remote rib fractures, progressed since the prior study. The heart size is normal. Mild chronic interstitial changes are evident. Emphysematous changes are again noted. No focal airspace disease is present.  IMPRESSION: 1. Stable emphysema and chronic interstitial coarsening. 2. Progressive callus formation associated with the right-sided rib fractures. 3. No acute cardiopulmonary disease.   Electronically Signed   By: Lawrence Santiago M.D.   On: 08/23/2013 11:46      MDM   1. Cough      CXR as above, No distress, relaxed posturing, afebrile and appears stable. No signs of CHF  Robitussin DM prn cough Go to the ED if worse or new symptoms or problems  Janne Napoleon, NP 08/23/13 1212

## 2013-08-23 NOTE — ED Notes (Addendum)
C/o onset Wednesday of cough, sweating in face, neck. Reports green/brown secretions (smoker) , CBG at home elevated, discomfort epigastric area , denies n/d/v; c/o trouble sleeping

## 2013-08-23 NOTE — ED Provider Notes (Signed)
Medical screening examination/treatment/procedure(s) were performed by non-physician practitioner and as supervising physician I was immediately available for consultation/collaboration.  Philipp Deputy, M.D.  Harden Mo, MD 08/23/13 [phone]

## 2013-08-27 ENCOUNTER — Encounter (HOSPITAL_BASED_OUTPATIENT_CLINIC_OR_DEPARTMENT_OTHER): Payer: PRIVATE HEALTH INSURANCE | Attending: General Surgery

## 2013-08-27 DIAGNOSIS — L97909 Non-pressure chronic ulcer of unspecified part of unspecified lower leg with unspecified severity: Secondary | ICD-10-CM | POA: Insufficient documentation

## 2013-08-27 DIAGNOSIS — L905 Scar conditions and fibrosis of skin: Secondary | ICD-10-CM | POA: Insufficient documentation

## 2013-09-06 ENCOUNTER — Emergency Department (HOSPITAL_COMMUNITY)
Admission: EM | Admit: 2013-09-06 | Discharge: 2013-09-06 | Disposition: A | Discharge status: Home / Self Care | Payer: PRIVATE HEALTH INSURANCE | Attending: Emergency Medicine | Admitting: Emergency Medicine

## 2013-09-06 ENCOUNTER — Encounter (HOSPITAL_COMMUNITY): Payer: Self-pay | Admitting: Emergency Medicine

## 2013-09-06 ENCOUNTER — Emergency Department (HOSPITAL_COMMUNITY): Payer: PRIVATE HEALTH INSURANCE

## 2013-09-06 ENCOUNTER — Inpatient Hospital Stay (HOSPITAL_COMMUNITY)
Admission: EM | Admit: 2013-09-06 | Discharge: 2013-09-10 | DRG: 974 | Disposition: A | Discharge status: Home / Self Care | Payer: PRIVATE HEALTH INSURANCE | Attending: Internal Medicine | Admitting: Internal Medicine

## 2013-09-06 DIAGNOSIS — E1142 Type 2 diabetes mellitus with diabetic polyneuropathy: Secondary | ICD-10-CM | POA: Insufficient documentation

## 2013-09-06 DIAGNOSIS — Z8619 Personal history of other infectious and parasitic diseases: Secondary | ICD-10-CM | POA: Insufficient documentation

## 2013-09-06 DIAGNOSIS — R197 Diarrhea, unspecified: Secondary | ICD-10-CM | POA: Diagnosis present

## 2013-09-06 DIAGNOSIS — Z79899 Other long term (current) drug therapy: Secondary | ICD-10-CM | POA: Insufficient documentation

## 2013-09-06 DIAGNOSIS — R6521 Severe sepsis with septic shock: Secondary | ICD-10-CM

## 2013-09-06 DIAGNOSIS — I10 Essential (primary) hypertension: Secondary | ICD-10-CM | POA: Diagnosis present

## 2013-09-06 DIAGNOSIS — R079 Chest pain, unspecified: Secondary | ICD-10-CM | POA: Insufficient documentation

## 2013-09-06 DIAGNOSIS — I2589 Other forms of chronic ischemic heart disease: Secondary | ICD-10-CM | POA: Diagnosis present

## 2013-09-06 DIAGNOSIS — G4700 Insomnia, unspecified: Secondary | ICD-10-CM

## 2013-09-06 DIAGNOSIS — Z951 Presence of aortocoronary bypass graft: Secondary | ICD-10-CM | POA: Insufficient documentation

## 2013-09-06 DIAGNOSIS — M109 Gout, unspecified: Secondary | ICD-10-CM | POA: Insufficient documentation

## 2013-09-06 DIAGNOSIS — A419 Sepsis, unspecified organism: Secondary | ICD-10-CM

## 2013-09-06 DIAGNOSIS — B20 Human immunodeficiency virus [HIV] disease: Secondary | ICD-10-CM | POA: Diagnosis present

## 2013-09-06 DIAGNOSIS — I5022 Chronic systolic (congestive) heart failure: Secondary | ICD-10-CM | POA: Diagnosis present

## 2013-09-06 DIAGNOSIS — I5043 Acute on chronic combined systolic (congestive) and diastolic (congestive) heart failure: Secondary | ICD-10-CM | POA: Diagnosis present

## 2013-09-06 DIAGNOSIS — Z21 Asymptomatic human immunodeficiency virus [HIV] infection status: Secondary | ICD-10-CM | POA: Insufficient documentation

## 2013-09-06 DIAGNOSIS — R739 Hyperglycemia, unspecified: Secondary | ICD-10-CM

## 2013-09-06 DIAGNOSIS — J4489 Other specified chronic obstructive pulmonary disease: Secondary | ICD-10-CM | POA: Diagnosis present

## 2013-09-06 DIAGNOSIS — M21611 Bunion of right foot: Secondary | ICD-10-CM

## 2013-09-06 DIAGNOSIS — Z7982 Long term (current) use of aspirin: Secondary | ICD-10-CM | POA: Insufficient documentation

## 2013-09-06 DIAGNOSIS — N179 Acute kidney failure, unspecified: Secondary | ICD-10-CM | POA: Diagnosis present

## 2013-09-06 DIAGNOSIS — E11622 Type 2 diabetes mellitus with other skin ulcer: Secondary | ICD-10-CM

## 2013-09-06 DIAGNOSIS — N189 Chronic kidney disease, unspecified: Secondary | ICD-10-CM | POA: Insufficient documentation

## 2013-09-06 DIAGNOSIS — E1149 Type 2 diabetes mellitus with other diabetic neurological complication: Secondary | ICD-10-CM | POA: Diagnosis present

## 2013-09-06 DIAGNOSIS — F1910 Other psychoactive substance abuse, uncomplicated: Secondary | ICD-10-CM

## 2013-09-06 DIAGNOSIS — L97909 Non-pressure chronic ulcer of unspecified part of unspecified lower leg with unspecified severity: Secondary | ICD-10-CM | POA: Diagnosis present

## 2013-09-06 DIAGNOSIS — E872 Acidosis: Secondary | ICD-10-CM

## 2013-09-06 DIAGNOSIS — Z8781 Personal history of (healed) traumatic fracture: Secondary | ICD-10-CM | POA: Insufficient documentation

## 2013-09-06 DIAGNOSIS — L97921 Non-pressure chronic ulcer of unspecified part of left lower leg limited to breakdown of skin: Secondary | ICD-10-CM

## 2013-09-06 DIAGNOSIS — J189 Pneumonia, unspecified organism: Secondary | ICD-10-CM

## 2013-09-06 DIAGNOSIS — J9600 Acute respiratory failure, unspecified whether with hypoxia or hypercapnia: Secondary | ICD-10-CM | POA: Diagnosis present

## 2013-09-06 DIAGNOSIS — R579 Shock, unspecified: Secondary | ICD-10-CM | POA: Diagnosis present

## 2013-09-06 DIAGNOSIS — I129 Hypertensive chronic kidney disease with stage 1 through stage 4 chronic kidney disease, or unspecified chronic kidney disease: Secondary | ICD-10-CM | POA: Insufficient documentation

## 2013-09-06 DIAGNOSIS — I2510 Atherosclerotic heart disease of native coronary artery without angina pectoris: Secondary | ICD-10-CM | POA: Insufficient documentation

## 2013-09-06 DIAGNOSIS — K921 Melena: Secondary | ICD-10-CM

## 2013-09-06 DIAGNOSIS — E118 Type 2 diabetes mellitus with unspecified complications: Secondary | ICD-10-CM | POA: Diagnosis present

## 2013-09-06 DIAGNOSIS — M21612 Bunion of left foot: Secondary | ICD-10-CM

## 2013-09-06 DIAGNOSIS — E782 Mixed hyperlipidemia: Secondary | ICD-10-CM | POA: Diagnosis present

## 2013-09-06 DIAGNOSIS — I25810 Atherosclerosis of coronary artery bypass graft(s) without angina pectoris: Secondary | ICD-10-CM | POA: Diagnosis present

## 2013-09-06 DIAGNOSIS — F17200 Nicotine dependence, unspecified, uncomplicated: Secondary | ICD-10-CM | POA: Diagnosis present

## 2013-09-06 DIAGNOSIS — E785 Hyperlipidemia, unspecified: Secondary | ICD-10-CM | POA: Insufficient documentation

## 2013-09-06 DIAGNOSIS — J159 Unspecified bacterial pneumonia: Secondary | ICD-10-CM | POA: Insufficient documentation

## 2013-09-06 DIAGNOSIS — J449 Chronic obstructive pulmonary disease, unspecified: Secondary | ICD-10-CM | POA: Diagnosis present

## 2013-09-06 DIAGNOSIS — R7401 Elevation of levels of liver transaminase levels: Secondary | ICD-10-CM | POA: Diagnosis present

## 2013-09-06 DIAGNOSIS — E875 Hyperkalemia: Secondary | ICD-10-CM | POA: Diagnosis present

## 2013-09-06 DIAGNOSIS — E871 Hypo-osmolality and hyponatremia: Secondary | ICD-10-CM | POA: Diagnosis present

## 2013-09-06 DIAGNOSIS — I5042 Chronic combined systolic (congestive) and diastolic (congestive) heart failure: Secondary | ICD-10-CM | POA: Diagnosis present

## 2013-09-06 DIAGNOSIS — A539 Syphilis, unspecified: Secondary | ICD-10-CM

## 2013-09-06 DIAGNOSIS — Z791 Long term (current) use of non-steroidal anti-inflammatories (NSAID): Secondary | ICD-10-CM | POA: Insufficient documentation

## 2013-09-06 HISTORY — DX: Hyperlipidemia, unspecified: E78.5

## 2013-09-06 HISTORY — DX: Pneumonia, unspecified organism: J18.9

## 2013-09-06 LAB — COMPREHENSIVE METABOLIC PANEL
ALT: 187 U/L — ABNORMAL HIGH (ref 0–53)
Alkaline Phosphatase: 88 U/L (ref 39–117)
BUN: 38 mg/dL — ABNORMAL HIGH (ref 6–23)
CO2: 17 mEq/L — ABNORMAL LOW (ref 19–32)
Chloride: 101 mEq/L (ref 96–112)
GFR calc Af Amer: 40 mL/min — ABNORMAL LOW (ref >90)
GFR calc non Af Amer: 35 mL/min — ABNORMAL LOW (ref >90)
Glucose, Bld: 271 mg/dL — ABNORMAL HIGH (ref 70–99)
Potassium: 5.8 mEq/L — ABNORMAL HIGH (ref 3.5–5.1)
Sodium: 132 mEq/L — ABNORMAL LOW (ref 135–145)
Total Bilirubin: 0.5 mg/dL (ref 0.3–1.2)
Total Protein: 6.4 g/dL (ref 6.0–8.3)

## 2013-09-06 LAB — CBC WITH DIFFERENTIAL/PLATELET
Eosinophils Absolute: 0.1 10*3/uL (ref 0.0–0.7)
Hemoglobin: 14.2 g/dL (ref 13.0–17.0)
Lymphocytes Relative: 44 % (ref 12–46)
Lymphs Abs: 1.8 10*3/uL (ref 0.7–4.0)
MCH: 38.8 pg — ABNORMAL HIGH (ref 26.0–34.0)
MCHC: 36.2 g/dL — ABNORMAL HIGH (ref 30.0–36.0)
Monocytes Relative: 13 % — ABNORMAL HIGH (ref 3–12)
Neutrophils Relative %: 40 % — ABNORMAL LOW (ref 43–77)
Platelets: 164 10*3/uL (ref 150–400)
RBC: 3.66 MIL/uL — ABNORMAL LOW (ref 4.22–5.81)
WBC: 4.1 10*3/uL (ref 4.0–10.5)

## 2013-09-06 LAB — URINALYSIS, ROUTINE W REFLEX MICROSCOPIC
Glucose, UA: NEGATIVE mg/dL
Ketones, ur: NEGATIVE mg/dL
Leukocytes, UA: NEGATIVE
Nitrite: NEGATIVE
Protein, ur: 100 mg/dL — AB
Urobilinogen, UA: 1 mg/dL (ref 0.0–1.0)
pH: 5 (ref 5.0–8.0)

## 2013-09-06 LAB — URINE MICROSCOPIC-ADD ON

## 2013-09-06 MED ORDER — LAMIVUDINE-ZIDOVUDINE 150-300 MG PO TABS
1.0000 | ORAL_TABLET | Freq: Two times a day (BID) | ORAL | Status: DC
  Administered 2013-09-07 – 2013-09-10 (×8): 1 via ORAL
  Filled 2013-09-06 (×9): qty 1

## 2013-09-06 MED ORDER — PREDNISONE 20 MG PO TABS
40.0000 mg | ORAL_TABLET | Freq: Every day | ORAL | Status: DC
  Administered 2013-09-08: 40 mg via ORAL
  Filled 2013-09-06 (×2): qty 2

## 2013-09-06 MED ORDER — DEXTROSE 5 % IV SOLN: 250.0000 mg | INTRAVENOUS | Status: DC

## 2013-09-06 MED ORDER — SODIUM CHLORIDE 0.9 % IV SOLN: 250.0000 mL | INTRAVENOUS | Status: DC | PRN

## 2013-09-06 MED ORDER — VANCOMYCIN HCL 10 G IV SOLR
1500.0000 mg | Freq: Once | INTRAVENOUS | Status: AC
  Administered 2013-09-06: 1500 mg via INTRAVENOUS
  Filled 2013-09-06 (×2): qty 1500

## 2013-09-06 MED ORDER — INSULIN ASPART 100 UNIT/ML ~~LOC~~ SOLN
0.0000 [IU] | SUBCUTANEOUS | Status: DC
  Administered 2013-09-07 (×3): 2 [IU] via SUBCUTANEOUS
  Administered 2013-09-07 – 2013-09-08 (×3): 3 [IU] via SUBCUTANEOUS
  Administered 2013-09-08: 01:00:00 2 [IU] via SUBCUTANEOUS
  Administered 2013-09-08: 17:00:00 5 [IU] via SUBCUTANEOUS
  Administered 2013-09-08: 09:00:00 1 [IU] via SUBCUTANEOUS
  Administered 2013-09-08: 21:00:00 7 [IU] via SUBCUTANEOUS
  Administered 2013-09-08: 1 [IU] via SUBCUTANEOUS
  Administered 2013-09-09: 5 [IU] via SUBCUTANEOUS
  Administered 2013-09-09: 13:00:00 3 [IU] via SUBCUTANEOUS
  Administered 2013-09-09: 05:00:00 2 [IU] via SUBCUTANEOUS
  Administered 2013-09-09: 21:00:00 3 [IU] via SUBCUTANEOUS
  Administered 2013-09-09: 1 [IU] via SUBCUTANEOUS
  Administered 2013-09-10: 2 [IU] via SUBCUTANEOUS
  Administered 2013-09-10 (×2): 1 [IU] via SUBCUTANEOUS

## 2013-09-06 MED ORDER — DEXTROSE 5 % IV SOLN
1.0000 g | Freq: Once | INTRAVENOUS | Status: AC
  Administered 2013-09-06: 1 g via INTRAVENOUS
  Filled 2013-09-06: qty 10

## 2013-09-06 MED ORDER — AZITHROMYCIN 250 MG PO TABS: 250.0000 mg | ORAL_TABLET | Freq: Every day | ORAL | Status: DC

## 2013-09-06 MED ORDER — SODIUM CHLORIDE 0.9 % IV BOLUS (SEPSIS)
2000.0000 mL | Freq: Once | INTRAVENOUS | Status: AC
  Administered 2013-09-06: 2000 mL via INTRAVENOUS

## 2013-09-06 MED ORDER — HEPARIN SODIUM (PORCINE) 5000 UNIT/ML IJ SOLN
5000.0000 [IU] | Freq: Three times a day (TID) | INTRAMUSCULAR | Status: DC
  Administered 2013-09-07 – 2013-09-10 (×11): 5000 [IU] via SUBCUTANEOUS
  Filled 2013-09-06 (×13): qty 1

## 2013-09-06 MED ORDER — HYDROCOD POLST-CHLORPHEN POLST 10-8 MG/5ML PO LQCR: 5.0000 mL | Freq: Two times a day (BID) | ORAL | Status: DC | PRN

## 2013-09-06 MED ORDER — ATORVASTATIN CALCIUM 40 MG PO TABS
40.0000 mg | ORAL_TABLET | Freq: Every day | ORAL | Status: DC
  Administered 2013-09-07 – 2013-09-09 (×3): 40 mg via ORAL
  Filled 2013-09-06 (×3): qty 1

## 2013-09-06 MED ORDER — ALBUTEROL SULFATE HFA 108 (90 BASE) MCG/ACT IN AERS: 1.0000 | INHALATION_SPRAY | Freq: Four times a day (QID) | RESPIRATORY_TRACT | Status: DC | PRN

## 2013-09-06 MED ORDER — VANCOMYCIN HCL IN DEXTROSE 1-5 GM/200ML-% IV SOLN: 1000.0000 mg | INTRAVENOUS | Status: DC

## 2013-09-06 MED ORDER — ASPIRIN EC 81 MG PO TBEC
81.0000 mg | DELAYED_RELEASE_TABLET | Freq: Every day | ORAL | Status: DC
  Administered 2013-09-07 – 2013-09-10 (×4): 81 mg via ORAL
  Filled 2013-09-06 (×4): qty 1

## 2013-09-06 MED ORDER — NITROGLYCERIN 0.3 MG SL SUBL
0.3000 mg | SUBLINGUAL_TABLET | SUBLINGUAL | Status: DC | PRN
  Filled 2013-09-06: qty 100

## 2013-09-06 MED ORDER — LATANOPROST 0.005 % OP SOLN
1.0000 [drp] | Freq: Every day | OPHTHALMIC | Status: DC
  Administered 2013-09-07 – 2013-09-10 (×3): 1 [drp] via OPHTHALMIC
  Filled 2013-09-06 (×2): qty 2.5

## 2013-09-06 MED ORDER — ALBUTEROL SULFATE HFA 108 (90 BASE) MCG/ACT IN AERS
2.0000 | INHALATION_SPRAY | Freq: Four times a day (QID) | RESPIRATORY_TRACT | Status: DC
  Administered 2013-09-07: 2 via RESPIRATORY_TRACT
  Filled 2013-09-06: qty 6.7

## 2013-09-06 MED ORDER — ATAZANAVIR SULFATE 200 MG PO CAPS
400.0000 mg | ORAL_CAPSULE | Freq: Every day | ORAL | Status: DC
  Administered 2013-09-07 – 2013-09-10 (×4): 400 mg via ORAL
  Filled 2013-09-06 (×7): qty 2

## 2013-09-06 MED ORDER — ALBUTEROL SULFATE HFA 108 (90 BASE) MCG/ACT IN AERS
2.0000 | INHALATION_SPRAY | RESPIRATORY_TRACT | Status: DC | PRN
Start: 2013-09-06 — End: 2013-09-10

## 2013-09-06 MED ORDER — HYDROCOD POLST-CHLORPHEN POLST 10-8 MG/5ML PO LQCR
5.0000 mL | Freq: Two times a day (BID) | ORAL | Status: DC | PRN
  Administered 2013-09-07 – 2013-09-10 (×7): 5 mL via ORAL
  Filled 2013-09-06 (×8): qty 5

## 2013-09-06 MED ORDER — DEXTROSE 5 % IV SOLN: 2.0000 g | Freq: Two times a day (BID) | INTRAVENOUS | Status: DC

## 2013-09-06 MED ORDER — DEXTROSE 5 % IV SOLN
500.0000 mg | Freq: Once | INTRAVENOUS | Status: AC
  Administered 2013-09-06: 500 mg via INTRAVENOUS

## 2013-09-06 MED ORDER — IPRATROPIUM BROMIDE HFA 17 MCG/ACT IN AERS
2.0000 | INHALATION_SPRAY | Freq: Four times a day (QID) | RESPIRATORY_TRACT | Status: DC
  Administered 2013-09-07: 14:00:00 2 via RESPIRATORY_TRACT
  Filled 2013-09-06: qty 12.9

## 2013-09-06 MED ORDER — ENALAPRIL MALEATE 10 MG PO TABS
10.0000 mg | ORAL_TABLET | Freq: Two times a day (BID) | ORAL | Status: DC
  Administered 2013-09-07 – 2013-09-10 (×8): 10 mg via ORAL
  Filled 2013-09-06 (×9): qty 1

## 2013-09-06 NOTE — ED Notes (Addendum)
PER EMS: pt reports flu-like symptoms, productive cough, chills but afebrile. VS: BP-176/100, HR-100. Pt ambulatory to room with slow, steady gait, no acute distress noted. Pt HIV positive and is concerned with being sick.

## 2013-09-06 NOTE — H&P (Signed)
PULMONARY  / CRITICAL CARE MEDICINE HISTORY AND PHYSICAL EXAMINATION  Name: Jacob [name] MRN: [id number] DOB: [birth year]    ADMISSION DATE:  09/06/2013  CHIEF COMPLAINT:  Blurry Vision  BRIEF PATIENT DESCRIPTION: 78 M with HIV (last CD4 85 in Dec 2013, VL  Undetectable 04/2013) who presents with shock like 2/2 pneumonia vs. Adrenal insufficiency.  SIGNIFICANT EVENTS / STUDIES:  1. Lactate 6.18, 09/06/2013  LINES / TUBES: 1. PIV  CULTURES: 1. Blood Culture Pending 2. Urine Culture Pending 3. Sputum Culture Pending  ANTIBIOTICS: 1. Vanc 11/14 - 2. Cefepime 11/14 - 3. Azithro 11/14 - 4. Ceftriaxone 11/14 -  HISTORY OF PRESENT ILLNESS:  Jacob [name] is a 62 yo M with HIV (last CD4 2 in Dec 2013, VL  Undetectable 04/2013), ICM (last EF 45-50%, August 2013), DMII, CKD who presents to [facility] this evening with blurry vision in the setting of fever and cough. Approximately 2 weeks ago he developed a cough for which he sought care through his PCP and the ED and was treated with cough supressants and, eventually, azithromycin. The cough was non-productive associated with low grade fevers which began yesterday. After coming to the ED this morning he filled the antibiotic he was prescribed and went home. At approximately 1 pm he began noting chills and soon developed blurry vision. He then called 911 - and was brought into the ED. He was hypotensive and was given several boluses of fluid, he now feels better. He notes pleuritic CP, wheezing, sore throat, and mild diarrhea. He denies HA, N/V, abdominal pain, or new rash. He does not a chronic LLE ucler.   PAST MEDICAL HISTORY :  Past Medical History  Diagnosis Date  . Syphilis, unspecified   . Coronary atherosclerosis of native coronary artery     s/p surgery  . Type II or unspecified type diabetes mellitus without mention of complication, not stated as uncontrolled 2004    Diagnosed in 2004. Came to our clinic in 5/07 and was on Glucotrol XL 10  mg BID and metformin 500 BID. Max A1C on those two meds was 6.9 on 03/10/08. He has been able to be removed from the meds while maintaining a good A1C. He has DM neuropathy (although neuropathy could be multifactorial.)  . Human immunodeficiency virus (HIV) disease   . Allergic rhinitis, cause unspecified   . Gout, unspecified   . Hyperlipidemia   . HTN (hypertension)   . Ischemic cardiomyopathy   . Drug abuse     hx; tested for cocaine as recently as 2/08. says he is not using drugs now - avoided defib. for this reason   . Acute maxillary sinusitis   . Backache, unspecified   . Coronary atherosclerosis of artery bypass graft   . Closed fracture of intertrochanteric section of femur   . Chronic kidney disease, unspecified   . Tobacco use disorder   . History of hepatitis C     Past Surgical History  Procedure Laterality Date  . Coronary artery bypass graft    . Left intertrochanteric hip fracture      s/p intermedullary nail placement 2/08  . Left transverse mandibular fracture  10/05  . Laproscopic cholecystectomy  8/07    Prior to Admission medications   Medication Sig Start Date End Date Taking? Authorizing Provider  albuterol (PROVENTIL HFA;VENTOLIN HFA) 108 (90 BASE) MCG/ACT inhaler Inhale 1-2 puffs into the lungs every 6 (six) hours as needed for wheezing or shortness of breath. 09/06/13   Hyman Bible,  PA-C  allopurinol (ZYLOPRIM) 300 MG tablet Take 1 tablet (300 mg total) by mouth daily. 11/24/11   Heinz Knuckles, MD  aspirin 81 MG tablet Take 81 mg by mouth daily.      Historical Provider, MD  atazanavir (REYATAZ) 200 MG capsule Take 2 capsules (400 mg total) by mouth daily with breakfast. 10/30/12   [name], MD  atorvastatin (LIPITOR) 40 MG tablet Take 1 tablet (40 mg total) by mouth daily. 01/23/12   Heinz Knuckles, MD  azithromycin (ZITHROMAX) 250 MG tablet Take 1 tablet (250 mg total) by mouth daily. Take first 2 tablets together, then 1 every day until  finished. 09/06/13   Heather Laisure, PA-C  Calcium Carbonate Antacid 1177 MG CHEW Chew 1 each by mouth daily.    Historical Provider, MD  carvedilol (COREG) 25 MG tablet Take 1 tablet (25 mg total) by mouth 2 (two) times daily with a meal. 06/04/13   Renella Cunas, MD  chlorpheniramine-HYDROcodone [facility] PENNKINETIC ER) 10-8 MG/5ML LQCR Take 5 mLs by mouth every 12 (twelve) hours as needed for cough. 09/06/13   Heather Laisure, PA-C  colchicine 0.6 MG tablet Take 0.6 mg by mouth continuous as needed. For gout.    Historical Provider, MD  enalapril (VASOTEC) 10 MG tablet Take 1 tablet by mouth 2 (two) times daily. 10/28/12   Historical Provider, MD  Glucosamine-Chondroit-Vit C-Mn (GLUCOSAMINE CHONDR 1500 COMPLX PO) Take 2 tablets by mouth daily.     Historical Provider, MD  lamiVUDine-zidovudine (COMBIVIR) 150-300 MG per tablet Take 1 tablet by mouth 2 (two) times daily. 10/30/12   [name], MD  latanoprost (XALATAN) 0.005 % ophthalmic solution Place 1 drop into both eyes daily. 04/23/12   Historical Provider, MD  metFORMIN (GLUCOPHAGE) 500 MG tablet Take 500 mg by mouth 2 (two) times daily with a meal.    Historical Provider, MD  naftifine (NAFTIN) 1 % cream Apply 1 application topically daily as needed. Every time pt washes his feet he applies cream.    Historical Provider, MD  naproxen (NAPROSYN) 250 MG tablet Take 250 mg by mouth 2 (two) times daily with a meal.    Historical Provider, MD  nitroGLYCERIN (NITROSTAT) 0.3 MG SL tablet Place 1 tablet (0.3 mg total) under the tongue every 5 (five) minutes as needed. 05/08/12   Renella Cunas, MD    Allergies  Allergen Reactions  . Amphetamines Other (See Comments)    unknown    FAMILY HISTORY:  Family History  Problem Relation Age of Onset  . Heart failure Father   . Heart attack Brother   . Drug abuse Brother   . Colon cancer Neg Hx     SOCIAL HISTORY:  reports that he has been smoking Cigarettes.  He has a 18 pack-year smoking  history. He has never used smokeless tobacco. He reports that he drinks alcohol. He reports that he does not use illicit drugs.  REVIEW OF SYSTEMS:  A 12-system ROS was conducted and, unless otherwise specified in the HPI, was negative.    PHYSICAL EXAM  VITAL SIGNS: Temp:  [98.3 F (36.8 C)-98.4 F (36.9 C)] 98.3 F (36.8 C) (11/14 0745) Pulse Rate:  [71-89] 71 (11/14 2330) Resp:  [16-23] 20 (11/14 2330) BP: (65-166)/(44-90) 125/65 mmHg (11/14 2330) SpO2:  [94 %-98 %] 94 % (11/14 2330) Weight:  [175 lb (79.379 kg)] 175 lb (79.379 kg) (11/14 2148)  HEMODYNAMICS:    VENTILATOR SETTINGS:    INTAKE / OUTPUT: Intake/Output  None     PHYSICAL EXAMINATION: General:  Elderly appearing AAM in NAD Neuro:  Intact HEENT:  Sclera anicteric, conjunctiva pink. MM Dry, OP clear. Neck:  Trachea supple and midline, (-) LAN or JVD Cardiovascular:  RRR, NS1/S2, (-) MRG Lungs:  Decreased BS diffusely, no audible wheezing or rales Abdomen:  S/NT/ND/(+)BS Musculoskeletal:  (-) C/C/E Skin:  8 mm clean based ulcer of LLE lateral surface. No drainage.  LABS:  CBC Recent Labs     09/06/13  2125  WBC  4.1  HGB  14.2  HCT  39.2  PLT  164    Coag's No results found for this basename: APTT, INR,  in the last 72 hours  BMET Recent Labs     09/06/13  2125  NA  132*  K  5.8*  CL  101  CO2  17*  BUN  38*  CREATININE  1.94*  GLUCOSE  271*    Electrolytes Recent Labs     09/06/13  2125  CALCIUM  7.3*    Sepsis Markers No results found for this basename: LACTICACIDVEN, PROCALCITON, O2SATVEN,  in the last 72 hours  ABG No results found for this basename: PHART, PCO2ART, PO2ART,  in the last 72 hours  Liver Enzymes Recent Labs     09/06/13  2125  AST  306*  ALT  187*  ALKPHOS  88  BILITOT  0.5  ALBUMIN  2.3*    Cardiac Enzymes No results found for this basename: TROPONINI, PROBNP,  in the last 72 hours  Glucose Recent Labs     09/06/13  0557  GLUCAP  252*     Imaging Dg Chest 2 View  09/06/2013   CLINICAL DATA:  Persistent cough for 3 weeks.  EXAM: CHEST  2 VIEW  COMPARISON:  Chest radiograph performed 08/23/2013  FINDINGS: The lungs are well-aerated. There is suspicion of left basilar airspace opacity, given the appearance of the lateral view. Chronic peribronchial thickening is noted. There is no evidence of pleural effusion or pneumothorax.  The heart is borderline normal in size. The patient is status post median sternotomy, with evidence of prior CABG. No acute osseous abnormalities are seen. Chronic right-sided rib deformities are seen. Clips are noted within the right upper quadrant, reflecting prior cholecystectomy.  IMPRESSION: Suspect left basilar airspace opacity, raising concern for mild pneumonia.   Electronically Signed   By: Garald Balding M.D.   On: 09/06/2013 07:00   Dg Chest Portable 1 View  09/06/2013   CLINICAL DATA:  Weakness, near syncope.  EXAM: PORTABLE CHEST - 1 VIEW  COMPARISON:  09/06/2013.  FINDINGS: Trachea is midline. Heart is enlarged and accentuated by AP supine technique. Probable vascular crowding with low lung volumes. No airspace consolidation or pleural fluid. Multiple old right rib fractures.  IMPRESSION: No acute findings.   Electronically Signed   By: Lorin Picket M.D.   On: 09/06/2013 21:49    EKG: EKG from this evening was personally reviewed by me. There are inferior Q waves which are unchaged with ST changes.  CXR: CXR from this evening was personally reviewed by me. There is questionable vascular crowding vs. subtle bilateral central infiltrates.   ASSESSMENT / PLAN: Principal Problem:   Shock Active Problems:   Community acquired pneumonia   DM II, diet controlled   Hyponatremia   Acute kidney injury   Hyperglycemia   Ulcer of lower extremity   Hyperkalemia   Elevated transaminase level   Chest pain   HIV  DISEASE   PULMONARY A/P: 1. Mild hypoxic respiratory failure: This is most likely  2/2 pneumonia. There is likely a mild A-a gradient 2/2 pneumonia vs COPD exacerbation.  2. Suspected COPD: Based on smoking history and mild hyperinflation on CX-ray patient likely has COPD. Poor air movement may be 2/2 a COPD exacerbation.  Supplemental O2 to maintain sats >=92%  Prednisone 40 mg daily  Standing Albuterol and Ipratropium  Consider OP PFTs  CARDIOVASCULAR A/P:  1. Shock: This is most likely septic in origin. Adrenal-crisis induced is possible as well. The steroids being used for a possible COPD exacerbation will treat possible adrenal-induced shock. Mr. Matton is responding well to IVF.  2. Chest Pain: This is low risk by symptoms in a high-risk host.  Continue IVF  Steroids as Above  Antibiotics  Serial Troponins  Continue OP Asa  RENAL A/P:  1. AKI on CKD: This is likely 2/2 septic shock. Will treat with IVF, if fails to resolve can evaluate further with urine electrolytes, [place], etc.  2. Hyperkalemia: Also likely 2/2 acute prerenal failure in setting of shock. No evidence of peaked Twaves. 3. Hyponatremia: Suspect hypovolemic hyponatremia.  Serial CMPs   GASTROINTESTINAL A/P:  1. Elevated Transaminases: ? Shock Liver  Serial CMPs  HEMATOLOGIC A/P:   1. No acute issue.  INFECTIOUS A/P: 1. CAP 2. HIV   Continue Vanc, Cefepime, Azithro  ID consult in AM for assistance with HIV management  Check VL, CD4 count  ENDOCRINE A/P: 1. DM: Holding metformin.  SSI  NEUROLOGIC A/P: 1. Loss of vision: Likely 2/2 hypotension in setting of shock. Resolved with IVF.  Monitor  MSK A/P:  1. Ucler: No evidence of frank infection  X-ray to eval for osteomyelitis, consider Bone Scan in negative   BEST PRACTICE / DISPOSITION Level of Care:  ICU Consultants:  ID in AM Code Status:  Full Diet:  Regular DVT Px:  SQH GI Px:  Not indicated Skin Integrity:  Ulcer Social / Family:  Patient able to update  TODAY'S SUMMARY:   I have personally  obtained a history, examined the patient, evaluated laboratory and imaging results, formulated the assessment and plan and placed orders.  CRITICAL CARE: The patient is critically ill with multiple organ systems failure and requires high complexity decision making for assessment and support, frequent evaluation and titration of therapies, application of advanced monitoring technologies and extensive interpretation of multiple databases. Critical Care Time devoted to patient care services described in this note is 45 minutes.   Margarette Asal, MD Pulmonary [facility] [place] Pager: [phone]  09/06/2013, 11:36 PM

## 2013-09-06 NOTE — ED Provider Notes (Signed)
CSN: [id number]     Arrival date & time 09/06/13  2051 History   First MD Initiated Contact with Patient 09/06/13 2051     Chief Complaint  Patient presents with  . Weakness   (Consider location/radiation/quality/duration/timing/severity/associated sxs/prior Treatment) The history is provided by the patient. No language interpreter was used.   Patient is 63 year old Caucasian male past medical history of HIV who comes emergency department today with shortness of breath blurred vision. He was initially evaluated in the emergency department earlier today for shortness of breath. At that time he was diagnosed with community-acquired pneumonia. He was discharged with a prescription for azithromycin he was instructed to return to emergency department if he develops worsening shortness of breath or any other concerns. Today the patient shortness of breath and cough have become progressively worse. He called EMS for multiple complaints including chills and blurred vision. He has had no fevers at home.  He denies dysuria, frequency, or urgency. He denies weakness or numbness.  He denies lower extremity edema or difficulty breathing lying flat.  Past Medical History  Diagnosis Date  . Syphilis, unspecified   . Coronary atherosclerosis of native coronary artery     s/p surgery  . Type II or unspecified type diabetes mellitus without mention of complication, not stated as uncontrolled 2004    Diagnosed in 2004. Came to our clinic in 5/07 and was on Glucotrol XL 10 mg BID and metformin 500 BID. Max A1C on those two meds was 6.9 on 03/10/08. He has been able to be removed from the meds while maintaining a good A1C. He has DM neuropathy (although neuropathy could be multifactorial.)  . Human immunodeficiency virus (HIV) disease   . Allergic rhinitis, cause unspecified   . Gout, unspecified   . Hyperlipidemia   . HTN (hypertension)   . Ischemic cardiomyopathy   . Drug abuse     hx; tested for cocaine as  recently as 2/08. says he is not using drugs now - avoided defib. for this reason   . Acute maxillary sinusitis   . Backache, unspecified   . Coronary atherosclerosis of artery bypass graft   . Closed fracture of intertrochanteric section of femur   . Chronic kidney disease, unspecified   . Tobacco use disorder   . History of hepatitis C    Past Surgical History  Procedure Laterality Date  . Coronary artery bypass graft    . Left intertrochanteric hip fracture      s/p intermedullary nail placement 2/08  . Left transverse mandibular fracture  10/05  . Laproscopic cholecystectomy  8/07   Family History  Problem Relation Age of Onset  . Heart failure Father   . Heart attack Brother   . Drug abuse Brother   . Colon cancer Neg Hx    History  Substance Use Topics  . Smoking status: Current Every Day Smoker -- 0.50 packs/day for 36 years    Types: Cigarettes  . Smokeless tobacco: Never Used  . Alcohol Use: 0.0 oz/week     Comment: 2 - 24 oz beers / week    Review of Systems  Constitutional: Positive for chills. Negative for fever.  Eyes: Positive for visual disturbance (blurred vision).  Respiratory: Positive for cough and shortness of breath. Negative for wheezing.   Cardiovascular: Negative for chest pain.  Gastrointestinal: Negative for nausea, vomiting, diarrhea and constipation.  Genitourinary: Negative for dysuria, urgency and frequency.  Neurological: Negative for dizziness, weakness, numbness and headaches.  All other  systems reviewed and are negative.    Allergies  Amphetamines  Home Medications   Current Outpatient Rx  Name  Route  Sig  Dispense  Refill  . albuterol (PROVENTIL HFA;VENTOLIN HFA) 108 (90 BASE) MCG/ACT inhaler   Inhalation   Inhale 1-2 puffs into the lungs every 6 (six) hours as needed for wheezing or shortness of breath.   1 Inhaler   0   . allopurinol (ZYLOPRIM) 300 MG tablet   Oral   Take 1 tablet (300 mg total) by mouth daily.   30  tablet   6   . aspirin 81 MG tablet   Oral   Take 81 mg by mouth daily.           [name] atazanavir (REYATAZ) 200 MG capsule   Oral   Take 2 capsules (400 mg total) by mouth daily with breakfast.   60 capsule   11   . atorvastatin (LIPITOR) 40 MG tablet   Oral   Take 1 tablet (40 mg total) by mouth daily.   30 tablet   11   . azithromycin (ZITHROMAX) 250 MG tablet   Oral   Take 1 tablet (250 mg total) by mouth daily. Take first 2 tablets together, then 1 every day until finished.   6 tablet   0   . carvedilol (COREG) 25 MG tablet   Oral   Take 1 tablet (25 mg total) by mouth 2 (two) times daily with a meal.   180 tablet   1   . chlorpheniramine-HYDROcodone (TUSSIONEX PENNKINETIC ER) 10-8 MG/5ML LQCR   Oral   Take 5 mLs by mouth every 12 (twelve) hours as needed for cough.   115 mL   0   . colchicine 0.6 MG tablet   Oral   Take 0.6 mg by mouth continuous as needed. For gout.         . collagenase (SANTYL) ointment   Topical   Apply 1 application topically daily. Apply to wound on ankle         . enalapril (VASOTEC) 10 MG tablet   Oral   Take 1 tablet by mouth 2 (two) times daily.         . Glucosamine-Chondroit-Vit C-Mn (GLUCOSAMINE CHONDR 1500 COMPLX PO)   Oral   Take 2 tablets by mouth daily.          [name] lamiVUDine-zidovudine (COMBIVIR) 150-300 MG per tablet   Oral   Take 1 tablet by mouth 2 (two) times daily.   60 tablet   11   . latanoprost (XALATAN) 0.005 % ophthalmic solution   Both Eyes   Place 1 drop into both eyes daily.         . metFORMIN (GLUCOPHAGE) 500 MG tablet   Oral   Take 500 mg by mouth 2 (two) times daily with a meal.         . naftifine (NAFTIN) 1 % cream   Topical   Apply 1 application topically daily as needed. Every time pt washes his feet he applies cream.         . naproxen (NAPROSYN) 250 MG tablet   Oral   Take 250 mg by mouth 2 (two) times daily with a meal.         . nitroGLYCERIN (NITROSTAT) 0.3 MG SL  tablet   Sublingual   Place 1 tablet (0.3 mg total) under the tongue every 5 (five) minutes as needed.   25 tablet   3  BP 125/65  Pulse 71  Resp 20  Ht 6\' 2"  (1.88 m)  Wt 175 lb (79.379 kg)  BMI 22.46 kg/m2  SpO2 94% Physical Exam  Nursing note and vitals reviewed. Constitutional: He is oriented to person, place, and time. He appears well-developed and well-nourished. He appears lethargic. No distress.  HENT:  Head: Normocephalic and atraumatic.  Eyes: Pupils are equal, round, and reactive to light.  Neck: Normal range of motion.  Cardiovascular: Normal rate, regular rhythm and normal heart sounds.   Pulmonary/Chest: Effort normal and breath sounds normal. No respiratory distress. He has no decreased breath sounds. He has no wheezes. He has no rhonchi. He has no rales.  Abdominal: Soft. He exhibits no distension. There is no tenderness. There is no rebound and no guarding.  Musculoskeletal: He exhibits no edema and no tenderness.  Neurological: He is oriented to person, place, and time. He appears lethargic. He exhibits normal muscle tone.  Easily aroused to voice.   Skin: Skin is warm and dry.    ED Course  Procedures (including critical care time) Labs Review Labs Reviewed  CBC WITH DIFFERENTIAL - Abnormal; Notable for the following:    RBC 3.66 (*)    MCV 107.1 (*)    MCH 38.8 (*)    MCHC 36.2 (*)    Neutrophils Relative % 40 (*)    Neutro Abs 1.6 (*)    Monocytes Relative 13 (*)    Basophils Relative 2 (*)    All other components within normal limits  COMPREHENSIVE METABOLIC PANEL - Abnormal; Notable for the following:    Sodium 132 (*)    Potassium 5.8 (*)    CO2 17 (*)    Glucose, Bld 271 (*)    BUN 38 (*)    Creatinine, Ser 1.94 (*)    Calcium 7.3 (*)    Albumin 2.3 (*)    AST 306 (*)    ALT 187 (*)    GFR calc non Af Amer 35 (*)    GFR calc Af Amer 40 (*)    All other components within normal limits  URINALYSIS, ROUTINE W REFLEX MICROSCOPIC -  Abnormal; Notable for the following:    APPearance CLOUDY (*)    Hgb urine dipstick SMALL (*)    Protein, ur 100 (*)    All other components within normal limits  URINE MICROSCOPIC-ADD ON - Abnormal; Notable for the following:    Squamous Epithelial / LPF FEW (*)    Bacteria, UA MANY (*)    Casts GRANULAR CAST (*)    All other components within normal limits  CG4 I-STAT (LACTIC ACID) - Abnormal; Notable for the following:    Lactic Acid, Venous 6.18 (*)    All other components within normal limits  CULTURE, BLOOD (ROUTINE X 2)  CULTURE, BLOOD (ROUTINE X 2)  URINE CULTURE  CULTURE, EXPECTORATED SPUTUM-ASSESSMENT  T-HELPER CELLS (CD4) COUNT  CORTISOL  LEGIONELLA ANTIGEN, URINE  STREP PNEUMONIAE URINARY ANTIGEN  PROCALCITONIN  PROCALCITONIN  HEMOGLOBIN A1C  CBC  CREATININE, SERUM  TROPONIN I  TROPONIN I  TROPONIN I   Imaging Review Dg Chest 2 View  09/06/2013   CLINICAL DATA:  Persistent cough for 3 weeks.  EXAM: CHEST  2 VIEW  COMPARISON:  Chest radiograph performed 08/23/2013  FINDINGS: The lungs are well-aerated. There is suspicion of left basilar airspace opacity, given the appearance of the lateral view. Chronic peribronchial thickening is noted. There is no evidence of pleural effusion or pneumothorax.  The heart is borderline normal in size. The patient is status post median sternotomy, with evidence of prior CABG. No acute osseous abnormalities are seen. Chronic right-sided rib deformities are seen. Clips are noted within the right upper quadrant, reflecting prior cholecystectomy.  IMPRESSION: Suspect left basilar airspace opacity, raising concern for mild pneumonia.   Electronically Signed   By: Garald Balding M.D.   On: 09/06/2013 07:00   Dg Chest Portable 1 View  09/06/2013   CLINICAL DATA:  Weakness, near syncope.  EXAM: PORTABLE CHEST - 1 VIEW  COMPARISON:  09/06/2013.  FINDINGS: Trachea is midline. Heart is enlarged and accentuated by AP supine technique. Probable  vascular crowding with low lung volumes. No airspace consolidation or pleural fluid. Multiple old right rib fractures.  IMPRESSION: No acute findings.   Electronically Signed   By: Lorin Picket M.D.   On: 09/06/2013 21:49    EKG Interpretation   None       MDM  Patient is a 62 year old African American male with past medical history of HIV who comes emergency department today with worsening shortness of breath and cough. Physical exam as above. Patient was initially hypotensive emergency department. He was treated with 2 L of normal saline which improved his blood pressure to 100. He became hypotensive a second time as result another liter of normal saline was administered which resolved his hypotension. With chest x-ray this morning which demonstrated a pneumonia. Patient was felt to likely be suffering from sepsis as a result of pneumonia. Initial workup included a UA, CD4 cell count, lactic acid, blood cultures, CBC, CMP, chest x-ray. Patient was treated with Rocephin and azithromycin IV in emergency. His care was discussed with infectious disease who recommended treating him like a normal community acquired pneumonia until CD4 count comes back. CMP demonstrated a potassium of 5.8, AST 36, ALT of 187, and creatinine of 1.94 all of the patient's baseline. Otherwise unremarkable. WBC was 4.1.  Absolute neutrophil count was 1.6.  Lactic acid was elevated to 6.18. Chest x-ray demonstrated no consolidations. Patient was felt to require admission to the critical care service for septic shock. They recommended treating with vancomycin at this time. Vancomycin was administered per their request. Patient was admitted to the critical care service in stable critical condition. Labs and imaging reviewed by myself and considered and medical decision-making. Imaging was interpreted by radiology. Care was discussed with my attending Dr. Audie Pinto.    1. Septic shock   2. Community acquired pneumonia   3.  Transaminitis   4. Acute kidney injury   5. Lactic acidosis   6. HIV (human immunodeficiency virus infection)      Katheren Shams, MD 09/06/13 [phone]

## 2013-09-06 NOTE — ED Notes (Signed)
i-stat GC4 6.44mmol/L result given to Dr. Audie Pinto

## 2013-09-06 NOTE — ED Notes (Signed)
MD at bedside. 

## 2013-09-06 NOTE — ED Notes (Signed)
Patient states "I cant see, my vision is blurry". When asked if he had this before he states that it has happened earlier and cleared up on its own. Patient has complaints of left sided abdominal pain.

## 2013-09-06 NOTE — ED Provider Notes (Signed)
CSN: [id number]     Arrival date & time 09/06/13  0549 History   First MD Initiated Contact with Patient 09/06/13 [phone]     Chief Complaint  Patient presents with  . Cough   (Consider location/radiation/quality/duration/timing/severity/associated sxs/prior Treatment) HPI Comments: Patient with a history of HIV presents today with a productive cough that has been present for the past 3 weeks.  He reports that his cough is gradually worsening.  He has taken Mucinex for the cough, which he reports helped initially, but is no longer helping.  He denies fever or chills.  He reports mild SOB.  Denies chest pain.  He does report some associated nasal congestion.  Denies sore throat or ear pain.  He reports that he currently smokes 0.5 ppd.  He is HIV positive.  Most recent CD4 count on 10/09/12 was 880.  Patient is currently followed by Infectious Disease.  He reports that he is compliant with all of his medication.  The history is provided by the patient.    Past Medical History  Diagnosis Date  . Syphilis, unspecified   . Coronary atherosclerosis of native coronary artery     s/p surgery  . Type II or unspecified type diabetes mellitus without mention of complication, not stated as uncontrolled 2004    Diagnosed in 2004. Came to our clinic in 5/07 and was on Glucotrol XL 10 mg BID and metformin 500 BID. Max A1C on those two meds was 6.9 on 03/10/08. He has been able to be removed from the meds while maintaining a good A1C. He has DM neuropathy (although neuropathy could be multifactorial.)  . Human immunodeficiency virus (HIV) disease   . Allergic rhinitis, cause unspecified   . Gout, unspecified   . Other and unspecified hyperlipidemia   . Unspecified essential hypertension   . Chronic systolic heart failure   . Ischemic cardiomyopathy     EF 30%  . Type II or unspecified type diabetes mellitus with neurological manifestations, not stated as uncontrolled(250.60)   . Drug abuse     hx; tested  for cocaine as recently as 2/08. says he is not using drugs now - avoided defib. for this reason   . Acute maxillary sinusitis   . Backache, unspecified   . Coronary atherosclerosis of artery bypass graft   . Closed fracture of intertrochanteric section of femur   . Chronic kidney disease, unspecified   . Tobacco use disorder   . History of hepatitis C    Past Surgical History  Procedure Laterality Date  . Coronary artery bypass graft    . Left intertrochanteric hip fracture      s/p intermedullary nail placement 2/08  . Left transverse mandibular fracture  10/05  . Laproscopic cholecystectomy  8/07   Family History  Problem Relation Age of Onset  . Heart failure Father   . Heart attack Brother   . Drug abuse Brother   . Colon cancer Neg Hx    History  Substance Use Topics  . Smoking status: Current Every Day Smoker -- 0.50 packs/day for 36 years    Types: Cigarettes  . Smokeless tobacco: Never Used  . Alcohol Use: 0.0 oz/week     Comment: 2 - 24 oz beers / week    Review of Systems  Respiratory: Positive for cough and shortness of breath.   All other systems reviewed and are negative.    Allergies  Amphetamines  Home Medications   Current Outpatient Rx  Name  Route  Sig  Dispense  Refill  . allopurinol (ZYLOPRIM) 300 MG tablet   Oral   Take 1 tablet (300 mg total) by mouth daily.   30 tablet   6   . aspirin 81 MG tablet   Oral   Take 81 mg by mouth daily.           [name] atazanavir (REYATAZ) 200 MG capsule   Oral   Take 2 capsules (400 mg total) by mouth daily with breakfast.   60 capsule   11   . atorvastatin (LIPITOR) 40 MG tablet   Oral   Take 1 tablet (40 mg total) by mouth daily.   30 tablet   11   . Calcium Carbonate Antacid 1177 MG CHEW   Oral   Chew 1 each by mouth daily.         . carvedilol (COREG) 25 MG tablet   Oral   Take 1 tablet (25 mg total) by mouth 2 (two) times daily with a meal.   180 tablet   1   . colchicine 0.6 MG  tablet   Oral   Take 0.6 mg by mouth continuous as needed. For gout.         . enalapril (VASOTEC) 10 MG tablet   Oral   Take 1 tablet by mouth 2 (two) times daily.         . Glucosamine-Chondroit-Vit C-Mn (GLUCOSAMINE CHONDR 1500 COMPLX PO)   Oral   Take 2 tablets by mouth daily.          [name] lamiVUDine-zidovudine (COMBIVIR) 150-300 MG per tablet   Oral   Take 1 tablet by mouth 2 (two) times daily.   60 tablet   11   . latanoprost (XALATAN) 0.005 % ophthalmic solution   Both Eyes   Place 1 drop into both eyes daily.         . metFORMIN (GLUCOPHAGE) 500 MG tablet   Oral   Take 500 mg by mouth 2 (two) times daily with a meal.         . naftifine (NAFTIN) 1 % cream   Topical   Apply 1 application topically daily as needed. Every time pt washes his feet he applies cream.         . naproxen (NAPROSYN) 250 MG tablet   Oral   Take 250 mg by mouth 2 (two) times daily with a meal.         . nitroGLYCERIN (NITROSTAT) 0.3 MG SL tablet   Sublingual   Place 1 tablet (0.3 mg total) under the tongue every 5 (five) minutes as needed.   25 tablet   3    BP 166/90  Pulse 83  Temp(Src) 98.4 F (36.9 C) (Oral)  Resp 16  SpO2 98% Physical Exam  Nursing note and vitals reviewed. Constitutional: He appears well-developed and well-nourished.  HENT:  Head: Normocephalic and atraumatic.  Mouth/Throat: Oropharynx is clear and moist.  Eyes: EOM are normal.  Neck: Normal range of motion. Neck supple.  Cardiovascular: Normal rate, regular rhythm and normal heart sounds.   Pulmonary/Chest: Effort normal and breath sounds normal. No respiratory distress. He has no wheezes. He has no rales.  Musculoskeletal: Normal range of motion.  Neurological: He is alert.  Skin: Skin is warm and dry.  Psychiatric: He has a normal mood and affect.    ED Course  Procedures (including critical care time) Labs Review Labs Reviewed  GLUCOSE, CAPILLARY - Abnormal; Notable for the  following:  Glucose-Capillary 252 (*)    All other components within normal limits   Imaging Review Dg Chest 2 View  09/06/2013   CLINICAL DATA:  Persistent cough for 3 weeks.  EXAM: CHEST  2 VIEW  COMPARISON:  Chest radiograph performed 08/23/2013  FINDINGS: The lungs are well-aerated. There is suspicion of left basilar airspace opacity, given the appearance of the lateral view. Chronic peribronchial thickening is noted. There is no evidence of pleural effusion or pneumothorax.  The heart is borderline normal in size. The patient is status post median sternotomy, with evidence of prior CABG. No acute osseous abnormalities are seen. Chronic right-sided rib deformities are seen. Clips are noted within the right upper quadrant, reflecting prior cholecystectomy.  IMPRESSION: Suspect left basilar airspace opacity, raising concern for mild pneumonia.   Electronically Signed   By: Garald Balding M.D.   On: 09/06/2013 07:00    EKG Interpretation   None      Patient discussed with Dr. Sharol Given. MDM  No diagnosis found. Patient with a history of HIV presents today with a chief complaint of productive cough for the past 3 weeks.  Patient reports compliance with HIV medications.  Most recent CD4 is 880.  Patient is afebrile.  No signs of respiratory distress.  Pulse ox 98 on RA.  Patient non toxic appearing.  Therefore, feel that the patient can be discharged home with outpatient antibiotics.  Patient discharged with Robitussin, Albuterol inhaler, and Azithromycin.  Case discussed with Dr. Sharol Given who agrees with the plan.  Return precautions given.      Hyman Bible, PA-C 09/07/13 [phone]

## 2013-09-06 NOTE — ED Notes (Signed)
Patient has a wound on his left lower leg that is the size of a quarter that has redness around the edges with a white center. Foul odor noted. Resident made aware.

## 2013-09-06 NOTE — ED Notes (Signed)
Patient arrived via [facility] from home. Patient was seen here at [facility] yesterday with a diagnosis of PNA. Patient called EMS stating he his not feeling right. States he has generalized weakness, shortness of breath with chest discomfort that is increased with coughing. Patient is very vague on his complaints. EMS administered NS 275 for hypotension. A/O.

## 2013-09-07 ENCOUNTER — Inpatient Hospital Stay (HOSPITAL_COMMUNITY): Payer: PRIVATE HEALTH INSURANCE

## 2013-09-07 DIAGNOSIS — J189 Pneumonia, unspecified organism: Secondary | ICD-10-CM

## 2013-09-07 DIAGNOSIS — I2589 Other forms of chronic ischemic heart disease: Secondary | ICD-10-CM

## 2013-09-07 DIAGNOSIS — N179 Acute kidney failure, unspecified: Secondary | ICD-10-CM

## 2013-09-07 DIAGNOSIS — Z21 Asymptomatic human immunodeficiency virus [HIV] infection status: Secondary | ICD-10-CM

## 2013-09-07 DIAGNOSIS — E118 Type 2 diabetes mellitus with unspecified complications: Secondary | ICD-10-CM

## 2013-09-07 DIAGNOSIS — L98499 Non-pressure chronic ulcer of skin of other sites with unspecified severity: Secondary | ICD-10-CM

## 2013-09-07 DIAGNOSIS — R579 Shock, unspecified: Secondary | ICD-10-CM

## 2013-09-07 DIAGNOSIS — A419 Sepsis, unspecified organism: Principal | ICD-10-CM

## 2013-09-07 LAB — COMPREHENSIVE METABOLIC PANEL
ALT: 189 U/L — ABNORMAL HIGH (ref 0–53)
ALT: 222 U/L — ABNORMAL HIGH (ref 0–53)
Albumin: 2.3 g/dL — ABNORMAL LOW (ref 3.5–5.2)
Albumin: 2.6 g/dL — ABNORMAL LOW (ref 3.5–5.2)
Alkaline Phosphatase: 91 U/L (ref 39–117)
Alkaline Phosphatase: 95 U/L (ref 39–117)
BUN: 31 mg/dL — ABNORMAL HIGH (ref 6–23)
Calcium: 7.1 mg/dL — ABNORMAL LOW (ref 8.4–10.5)
Chloride: 106 mEq/L (ref 96–112)
Creatinine, Ser: 1.37 mg/dL — ABNORMAL HIGH (ref 0.50–1.35)
GFR calc Af Amer: 57 mL/min — ABNORMAL LOW (ref >90)
GFR calc Af Amer: 61 mL/min — ABNORMAL LOW (ref >90)
Glucose, Bld: 153 mg/dL — ABNORMAL HIGH (ref 70–99)
Potassium: 5 mEq/L (ref 3.5–5.1)
Potassium: 4.7 mEq/L (ref 3.5–5.1)
Sodium: 135 mEq/L (ref 135–145)
Sodium: 134 mEq/L — ABNORMAL LOW (ref 135–145)
Total Bilirubin: 1 mg/dL (ref 0.3–1.2)
Total Protein: 6.6 g/dL (ref 6.0–8.3)
Total Protein: 7.2 g/dL (ref 6.0–8.3)

## 2013-09-07 LAB — EXPECTORATED SPUTUM ASSESSMENT W GRAM STAIN, RFLX TO RESP C

## 2013-09-07 LAB — GLUCOSE, CAPILLARY
Glucose-Capillary: 214 mg/dL — ABNORMAL HIGH (ref 70–99)
Glucose-Capillary: 93 mg/dL (ref 70–99)
Glucose-Capillary: 158 mg/dL — ABNORMAL HIGH (ref 70–99)

## 2013-09-07 LAB — CBC
HCT: 36.2 % — ABNORMAL LOW (ref 39.0–52.0)
Hemoglobin: 13 g/dL (ref 13.0–17.0)
MCH: 38.5 pg — ABNORMAL HIGH (ref 26.0–34.0)
MCHC: 35.9 g/dL (ref 30.0–36.0)
MCV: 107.1 fL — ABNORMAL HIGH (ref 78.0–100.0)
Platelets: 163 10*3/uL (ref 150–400)
RBC: 3.38 MIL/uL — ABNORMAL LOW (ref 4.22–5.81)
WBC: 4.8 10*3/uL (ref 4.0–10.5)

## 2013-09-07 LAB — PROTIME-INR: INR: 1.29 (ref 0.00–1.49)

## 2013-09-07 LAB — CREATININE, SERUM: GFR calc Af Amer: 51 mL/min — ABNORMAL LOW (ref >90)

## 2013-09-07 LAB — TROPONIN I
Troponin I: <0.3 ng/mL (ref <0.30)
Troponin I: <0.3 ng/mL (ref <0.30)
Troponin I: <0.3 ng/mL (ref <0.30)

## 2013-09-07 LAB — LEGIONELLA ANTIGEN, URINE

## 2013-09-07 LAB — EXPECTORATED SPUTUM ASSESSMENT W REFEX TO RESP CULTURE

## 2013-09-07 LAB — CORTISOL: Cortisol, Plasma: 17.1 ug/dL

## 2013-09-07 LAB — PROCALCITONIN: Procalcitonin: 1.65 ng/mL

## 2013-09-07 MED ORDER — DEXTROSE 5 % IV SOLN
250.0000 mg | INTRAVENOUS | Status: DC
  Administered 2013-09-07: 22:00:00 250 mg via INTRAVENOUS
  Filled 2013-09-07 (×2): qty 250

## 2013-09-07 MED ORDER — VANCOMYCIN HCL IN DEXTROSE 750-5 MG/150ML-% IV SOLN
750.0000 mg | Freq: Two times a day (BID) | INTRAVENOUS | Status: DC
  Administered 2013-09-07 – 2013-09-09 (×4): 750 mg via INTRAVENOUS
  Filled 2013-09-07 (×5): qty 150

## 2013-09-07 MED ORDER — NITROGLYCERIN 0.4 MG SL SUBL: 0.4000 mg | SUBLINGUAL_TABLET | SUBLINGUAL | Status: DC | PRN

## 2013-09-07 MED ORDER — VANCOMYCIN HCL IN DEXTROSE 1-5 GM/200ML-% IV SOLN
1000.0000 mg | INTRAVENOUS | Status: DC
  Filled 2013-09-07: qty 200

## 2013-09-07 MED ORDER — DEXTROSE 5 % IV SOLN
2.0000 g | INTRAVENOUS | Status: DC
  Administered 2013-09-07: 2 g via INTRAVENOUS
  Filled 2013-09-07: qty 2

## 2013-09-07 MED ORDER — DEXTROSE 5 % IV SOLN
1.0000 g | Freq: Three times a day (TID) | INTRAVENOUS | Status: DC
  Administered 2013-09-07 – 2013-09-09 (×6): 1 g via INTRAVENOUS
  Filled 2013-09-07 (×8): qty 1

## 2013-09-07 NOTE — Progress Notes (Signed)
Utilization review completed.  P.J. Connie Hilgert,RN,BSN Case Manager [phone]  

## 2013-09-07 NOTE — H&P (Signed)
PULMONARY  / CRITICAL CARE MEDICINE HISTORY AND PHYSICAL EXAMINATION  Name: DE KOPKA MRN: [id number] DOB: [birth year]    ADMISSION DATE:  09/06/2013  CHIEF COMPLAINT:  Blurry Vision  BRIEF PATIENT DESCRIPTION: 19 M with HIV (last CD4 19 in Dec 2013, VL  Undetectable 04/2013) who presents with shock like 2/2 pneumonia vs. Adrenal insufficiency.  SIGNIFICANT EVENTS / STUDIES:  1. Lactate 6.18, 09/06/2013  LINES / TUBES: 1. PIV  CULTURES: 1. Blood Culture Pending 2. Urine Culture Pending 3. Sputum Culture Pending  ANTIBIOTICS: 1. Vanc 11/14 - 2. Cefepime 11/14 - 3. Azithro 11/14 - 4. HIV drugs per ID  Subjective: Alert and interactive, following commands.  PHYSICAL EXAM  VITAL SIGNS: Temp:  [98 F (36.7 C)] 98 F (36.7 C) (11/15 0800) Pulse Rate:  [68-101] 75 (11/15 0700) Resp:  [17-26] 24 (11/15 0700) BP: (65-151)/(44-75) 144/68 mmHg (11/15 0700) SpO2:  [91 %-99 %] 94 % (11/15 0700) Weight:  [79.379 kg (175 lb)-82.5 kg (181 lb 14.1 oz)] 82.5 kg (181 lb 14.1 oz) (11/15 0133)  HEMODYNAMICS:    VENTILATOR SETTINGS:    INTAKE / OUTPUT: Intake/Output     11/14 0701 - 11/15 0700 11/15 0701 - 11/16 0700   I.V. (mL/kg) 3000 (36.4)    Total Intake(mL/kg) 3000 (36.4)    Urine (mL/kg/hr) 450    Total Output 450     Net +2550           PHYSICAL EXAMINATION: General:  Elderly appearing AAM in NAD Neuro:  Intact HEENT:  Sclera anicteric, conjunctiva pink. MM Dry, OP clear. Neck:  Trachea supple and midline, (-) LAN or JVD Cardiovascular:  RRR, NS1/S2, (-) MRG Lungs:  Decreased BS diffusely, no audible wheezing or rales Abdomen:  S/NT/ND/(+)BS Musculoskeletal:  (-) C/C/E Skin:  8 mm clean based ulcer of LLE lateral surface. No drainage.  LABS:  CBC Recent Labs     09/06/13  2125 09/07/13  WBC  4.1  4.8  HGB  14.2  13.0  HCT  39.2  36.2*  PLT  164  163   Coag's Recent Labs    09/07/13  INR  1.29   BMET Recent Labs     09/06/13  2125 09/07/13   09/07/13  0345  NA  132*   --   135  K  5.8*   --   5.0  CL  101   --   107  CO2  17*   --   14*  BUN  38*   --   34*  CREATININE  1.94*  1.59*  1.46*  GLUCOSE  271*   --   153*   Electrolytes Recent Labs     09/06/13  2125  09/07/13  0345  CALCIUM  7.3*  7.1*   Sepsis Markers Recent Labs    09/07/13  09/07/13  0345  PROCALCITON  1.50  1.65   ABG No results found for this basename: PHART, PCO2ART, PO2ART,  in the last 72 hours  Liver Enzymes Recent Labs     09/06/13  2125  09/07/13  0345  AST  306*  306*  ALT  187*  189*  ALKPHOS  88  91  BILITOT  0.5  0.3  ALBUMIN  2.3*  2.3*   Cardiac Enzymes Recent Labs    09/07/13  09/07/13  0345  TROPONINI  <0.30  <0.30   Glucose Recent Labs     09/06/13  0557  09/07/13  0115  09/07/13  0323  09/07/13  0742  GLUCAP  252*  214*  174*  93   Imaging Dg Chest 2 View  09/06/2013   CLINICAL DATA:  Persistent cough for 3 weeks.  EXAM: CHEST  2 VIEW  COMPARISON:  Chest radiograph performed 08/23/2013  FINDINGS: The lungs are well-aerated. There is suspicion of left basilar airspace opacity, given the appearance of the lateral view. Chronic peribronchial thickening is noted. There is no evidence of pleural effusion or pneumothorax.  The heart is borderline normal in size. The patient is status post median sternotomy, with evidence of prior CABG. No acute osseous abnormalities are seen. Chronic right-sided rib deformities are seen. Clips are noted within the right upper quadrant, reflecting prior cholecystectomy.  IMPRESSION: Suspect left basilar airspace opacity, raising concern for mild pneumonia.   Electronically Signed   By: Garald Balding M.D.   On: 09/06/2013 07:00   Dg Tibia/fibula Left  09/07/2013   CLINICAL DATA:  Ulceration along the distal left lateral lower leg and ankle. Assess for osteomyelitis.  EXAM: LEFT TIBIA AND FIBULA - 2 VIEW  COMPARISON:  None.  FINDINGS: The patient's lower leg soft tissue ulceration is  partially characterized. No underlying osseous erosion is seen to suggest osteomyelitis. The tibia and fibula appear intact. The ankle mortise is incompletely assessed, but appears grossly unremarkable.  The knee joint is grossly unremarkable in appearance. Scattered vascular calcifications are seen.  IMPRESSION: 1. No osseous erosion seen to suggest osteomyelitis. The tibia and fibula remain intact. 2. Scattered vascular calcifications seen.   Electronically Signed   By: Garald Balding M.D.   On: 09/07/2013 01:17   Dg Chest Portable 1 View  09/06/2013   CLINICAL DATA:  Weakness, near syncope.  EXAM: PORTABLE CHEST - 1 VIEW  COMPARISON:  09/06/2013.  FINDINGS: Trachea is midline. Heart is enlarged and accentuated by AP supine technique. Probable vascular crowding with low lung volumes. No airspace consolidation or pleural fluid. Multiple old right rib fractures.  IMPRESSION: No acute findings.   Electronically Signed   By: Lorin Picket M.D.   On: 09/06/2013 21:49    EKG: EKG from this evening was personally reviewed by me. There are inferior Q waves which are unchaged with ST changes.  CXR: CXR from this evening was personally reviewed by me. There is questionable vascular crowding vs. subtle bilateral central infiltrates.   ASSESSMENT / PLAN: Principal Problem:   Shock Active Problems:   HIV DISEASE   DM II, diet controlled   Ulcer of lower extremity   Community acquired pneumonia   Hyponatremia   Hyperkalemia   Acute kidney injury   Hyperglycemia   Elevated transaminase level   Chest pain  PULMONARY A/P: 1. Mild hypoxic respiratory failure: This is most likely 2/2 pneumonia. There is likely a mild A-a gradient 2/2 pneumonia vs COPD exacerbation.  2. Suspected COPD: Based on smoking history and mild hyperinflation on CX-ray patient likely has COPD. Poor air movement may be 2/2 a COPD exacerbation.  Supplemental O2 to maintain sats >=92%  Prednisone 40 mg daily  Standing  Albuterol and Ipratropium  Consider OP PFTs  CARDIOVASCULAR A/P:  1. Shock: This is most likely septic in origin. Adrenal-crisis induced is possible as well. The steroids being used for a possible COPD exacerbation will treat possible adrenal-induced shock.  Responded well to IVF and to obtaining the proper blood pressure cuff.  2. Chest Pain: This is low risk by symptoms in a high-risk host.  Continue IVF.  Steroids as Above.  Antibiotics per ID.  Serial Troponins as ordered.  Continue OP ASA.  RENAL A/P:  1. AKI on CKD: This is likely 2/2 septic shock. Will treat with IVF, if fails to resolve can evaluate further with urine electrolytes, [place], etc.  2. Hyperkalemia: Also likely 2/2 acute prerenal failure in setting of shock. No evidence of peaked Twaves. 3. Hyponatremia: Suspect hypovolemic hyponatremia.  Serial CMPs  GASTROINTESTINAL A/P:  1. Elevated Transaminases: ? Shock Liver  Serial CMPs  HEMATOLOGIC A/P:   1. No acute issue.  INFECTIOUS A/P: 1. CAP 2. HIV  Continue Vanc, Cefepime, Azithro  ID consult appreciated  Check VL, CD4 count pending  ENDOCRINE A/P: 1. DM: Holding metformin.  SSI.  NEUROLOGIC A/P: 1. Loss of vision: Likely 2/2 hypotension in setting of shock. Resolved with IVF.  Monitor.  MSK A/P:  1. Ucler: No evidence of frank infection  X-ray to eval for osteomyelitis, consider Bone Scan in negative  TODAY'S SUMMARY: BP much more stable, transferring patient to tele and to [facility] service, PCCM will sign off, please call back if needed.  I have personally obtained a history, examined the patient, evaluated laboratory and imaging results, formulated the assessment and plan and placed orders.  Rush Farmer, M.D. [facility] Pulmonary/Critical Care Medicine. Pager: [phone]. After hours pager: [phone].

## 2013-09-07 NOTE — ED Provider Notes (Signed)
Medical screening examination/treatment/procedure(s) were performed by non-physician practitioner and as supervising physician I was immediately available for consultation/collaboration.    Kalman Drape, MD 09/07/13 2114

## 2013-09-07 NOTE — Progress Notes (Signed)
NURSING PROGRESS NOTE  Jacob [name] [id number] Transfer Data: 09/07/2013 1:31 PM Attending Provider: Mariea Clonts, MD [id number], Nicki Reaper, MD Code Status: Full   Jacob [name] is a 62 y.o. male patient transferred from 20M -No acute distress noted.  -No complaints of shortness of breath.  -No complaints of chest pain.   Cardiac Monitoring: Box # 3 in place. Cardiac monitor yields:normal sinus rhythm.  Blood pressure 166/93, pulse 67, temperature 98 F (36.7 C), temperature source Oral, resp. rate 18, height 6\' 2"  (1.88 m), weight 82.5 kg (181 lb 14.1 oz), SpO2 96.00%.   IV Fluids:  IV in place, occlusive dsg intact without redness, IV cath forearm right, condition patent and no redness and antecubital left, condition patent and no redness none.   Allergies:  Amphetamines  Past Medical History:   has a past medical history of Syphilis, unspecified; Coronary atherosclerosis of native coronary artery; Type II or unspecified type diabetes mellitus without mention of complication, not stated as uncontrolled (2004); Human immunodeficiency virus (HIV) disease; Allergic rhinitis, cause unspecified; Gout, unspecified; Hyperlipidemia; HTN (hypertension); Ischemic cardiomyopathy; Drug abuse; Acute maxillary sinusitis; Backache, unspecified; Coronary atherosclerosis of artery bypass graft; Closed fracture of intertrochanteric section of femur; Chronic kidney disease, unspecified; Tobacco use disorder; and History of hepatitis C.  Past Surgical History:   has past surgical history that includes Coronary artery bypass graft; left intertrochanteric hip fracture; left transverse mandibular fracture (10/05); and laproscopic cholecystectomy (8/07).  Social History:   reports that he has been smoking Cigarettes.  He has a 18 pack-year smoking history. He has never used smokeless tobacco. He reports that he drinks alcohol. He reports that he does not use illicit drugs.  Skin: Left Lower Leg ulcer  with foam dressing  Patient/Family orientated to room. Information packet given to patient/family. Admission inpatient armband information verified with patient/family to include name and date of birth and placed on patient arm. Side rails up x 2, fall assessment and education completed with patient/family. Patient/family able to verbalize understanding of risk associated with falls and verbalized understanding to call for assistance before getting out of bed. Call light within reach. Patient/family able to voice and demonstrate understanding of unit orientation instructions.    Will continue to evaluate and treat per MD orders.

## 2013-09-07 NOTE — ED Notes (Signed)
Going by xray prior to going to the floor

## 2013-09-07 NOTE — Consult Note (Signed)
INFECTIOUS DISEASE CONSULT NOTE  Date of Admission:  09/06/2013  Date of Consult:  09/07/2013  Reason for Consult: [place] Referring Physician: HIV, Pneumonia  Impression/Recommendation HIV+ Community Acquired Pneumonia Hypotension, shock ARF on CRI Ischemic CM LLE ulcer, chronic  would continue his current anbx Plain film of his ulcer shows no underlying osteo He has regular (every Tuesday) wound care f/u Watch his Cr (improving) Check TTE Consider wound care eval.  recheck his CD4 and HIV RNA, [id number] (to see if he could go on triumeq which would be relatively renal sparing).  Last seen in ID clinic 04-2013, he was offered qday FDC but refused.  Appreciate CCM caring for this patient.   Thank you so much for this interesting consult,   Bobby Rumpf (pager) [phone] [email or web address]  Jacob [name] is an 62 y.o. male.  HPI: 62 yo M with HIV+, DM2 (04), CKD Isch CM (30%), was seen in ED 08-23-13 with a 2 day hx of dry cough. He had a CXR which showed COPD and chronic interstitial coarse markings. He was given robitussin and went home. He was also seen by his PCP and given azithromycin.  He returned to ED on 09-06-13 via EMS after developing chills and blurry vision. In ED he was found to have (65-166)/(44-90) hypotension. He was hydrated aggressively and was admitted to ICU. He complained as well of pleuritic CP, wheezing, sore throat, diarrhea. He was afebrile in ED. His Cr was 1.94 (previously 1.31-1.18). His CXR showed LLL infiltrate.  He was started on vanco/azithro/cefepime, prednisone.  His ART was continued (CBV/ATV).  He is being transferred out of ICU this AM.   HIV 1 RNA Quant (copies/mL)  Date Value  04/30/2013 <20   10/09/2012 <20   04/03/2012 <20      CD4 T Cell Abs (cmm)  Date Value  10/09/2012 880   04/03/2012 1110   09/27/2011 1390      Past Medical History  Diagnosis Date  . Syphilis, unspecified   . Coronary atherosclerosis of native  coronary artery     s/p surgery  . Type II or unspecified type diabetes mellitus without mention of complication, not stated as uncontrolled 2004    Diagnosed in 2004. Came to our clinic in 5/07 and was on Glucotrol XL 10 mg BID and metformin 500 BID. Max A1C on those two meds was 6.9 on 03/10/08. He has been able to be removed from the meds while maintaining a good A1C. He has DM neuropathy (although neuropathy could be multifactorial.)  . Human immunodeficiency virus (HIV) disease   . Allergic rhinitis, cause unspecified   . Gout, unspecified   . Hyperlipidemia   . HTN (hypertension)   . Ischemic cardiomyopathy   . Drug abuse     hx; tested for cocaine as recently as 2/08. says he is not using drugs now - avoided defib. for this reason   . Acute maxillary sinusitis   . Backache, unspecified   . Coronary atherosclerosis of artery bypass graft   . Closed fracture of intertrochanteric section of femur   . Chronic kidney disease, unspecified   . Tobacco use disorder   . History of hepatitis C     Past Surgical History  Procedure Laterality Date  . Coronary artery bypass graft    . Left intertrochanteric hip fracture      s/p intermedullary nail placement 2/08  . Left transverse mandibular fracture  10/05  . Laproscopic cholecystectomy  8/07     Allergies  Allergen Reactions  . Amphetamines Other (See Comments)    unknown    Medications:  Scheduled: . albuterol  2 puff Inhalation Q6H  . aspirin EC  81 mg Oral Daily  . atazanavir  400 mg Oral Q breakfast  . atorvastatin  40 mg Oral Daily  . azithromycin  250 mg Intravenous Q24H  . ceFEPime (MAXIPIME) IV  2 g Intravenous Q24H  . enalapril  10 mg Oral BID  . heparin  5,000 Units Subcutaneous Q8H  . insulin aspart  0-9 Units Subcutaneous Q4H  . ipratropium  2 puff Inhalation Q6H  . lamiVUDine-zidovudine  1 tablet Oral BID  . latanoprost  1 drop Both Eyes Daily  . predniSONE  40 mg Oral Q breakfast  . vancomycin  1,000 mg  Intravenous Q24H    Total days of antibiotics: 2 Vanco/cefepime/azithro          Social History:  reports that he has been smoking Cigarettes.  He has a 18 pack-year smoking history. He has never used smokeless tobacco. He reports that he drinks alcohol. He reports that he does not use illicit drugs.  Family History  Problem Relation Age of Onset  . Heart failure Father   . Heart attack Brother   . Drug abuse Brother   . Colon cancer Neg Hx     General ROS: vision change resolved, cough prod of beige sputum, occas d/c from LE ulcer, no oral ulcers, see HPI.   Blood pressure 144/68, pulse 75, temperature 98 F (36.7 C), temperature source Oral, resp. rate 24, height 6\' 2"  (1.88 m), weight 82.5 kg (181 lb 14.1 oz), SpO2 94.00%. General appearance: alert, cooperative and no distress Eyes: negative findings: conjunctivae and sclerae normal and pupils equal, round, reactive to light and accomodation Throat: normal findings: oropharynx pink & moist without lesions or evidence of thrush Neck: no adenopathy and supple, symmetrical, trachea midline Lungs: diminished breath sounds bibasilar Heart: regular rate and rhythm Abdomen: normal findings: bowel sounds normal and soft, non-tender Extremities: edema none.  Skin: multiple calluses on feet. on LLE there is 1.5 cm ulcer with no d/c above lateral maleolus. non-tender.  Light touch normal.    Results for orders placed during the hospital encounter of 09/06/13 (from the past 48 hour(s))  CBC WITH DIFFERENTIAL     Status: Abnormal   Collection Time    09/06/13  9:25 PM      Result Value Range   WBC 4.1  4.0 - 10.5 K/uL   RBC 3.66 (*) 4.22 - 5.81 MIL/uL   Hemoglobin 14.2  13.0 - 17.0 g/dL   HCT 39.2  39.0 - 52.0 %   MCV 107.1 (*) 78.0 - 100.0 fL   MCH 38.8 (*) 26.0 - 34.0 pg   MCHC 36.2 (*) 30.0 - 36.0 g/dL   RDW 12.7  11.5 - 15.5 %   Platelets 164  150 - 400 K/uL   Neutrophils Relative % 40 (*) 43 - 77 %   Neutro Abs 1.6 (*) 1.7  - 7.7 K/uL   Lymphocytes Relative 44  12 - 46 %   Lymphs Abs 1.8  0.7 - 4.0 K/uL   Monocytes Relative 13 (*) 3 - 12 %   Monocytes Absolute 0.5  0.1 - 1.0 K/uL   Eosinophils Relative 2  0 - 5 %   Eosinophils Absolute 0.1  0.0 - 0.7 K/uL   Basophils Relative 2 (*) 0 - 1 %   Basophils Absolute 0.1  0.0 -  0.1 K/uL  COMPREHENSIVE METABOLIC PANEL     Status: Abnormal   Collection Time    09/06/13  9:25 PM      Result Value Range   Sodium 132 (*) 135 - 145 mEq/L   Potassium 5.8 (*) 3.5 - 5.1 mEq/L   Chloride 101  96 - 112 mEq/L   CO2 17 (*) 19 - 32 mEq/L   Glucose, Bld 271 (*) 70 - 99 mg/dL   BUN 38 (*) 6 - 23 mg/dL   Creatinine, Ser 1.94 (*) 0.50 - 1.35 mg/dL   Calcium 7.3 (*) 8.4 - 10.5 mg/dL   Total Protein 6.4  6.0 - 8.3 g/dL   Albumin 2.3 (*) 3.5 - 5.2 g/dL   AST 306 (*) 0 - 37 U/L   ALT 187 (*) 0 - 53 U/L   Alkaline Phosphatase 88  39 - 117 U/L   Total Bilirubin 0.5  0.3 - 1.2 mg/dL   GFR calc non Af Amer 35 (*) >90 mL/min   GFR calc Af Amer 40 (*) >90 mL/min   Comment: (NOTE)     The eGFR has been calculated using the CKD EPI equation.     This calculation has not been validated in all clinical situations.     eGFR's persistently <90 mL/min signify possible Chronic Kidney     Disease.  CG4 I-STAT (LACTIC ACID)     Status: Abnormal   Collection Time    09/06/13  9:43 PM      Result Value Range   Lactic Acid, Venous 6.18 (*) 0.5 - 2.2 mmol/L  URINALYSIS, ROUTINE W REFLEX MICROSCOPIC     Status: Abnormal   Collection Time    09/06/13 10:39 PM      Result Value Range   Color, Urine YELLOW  YELLOW   APPearance CLOUDY (*) CLEAR   Specific Gravity, Urine 1.019  1.005 - 1.030   pH 5.0  5.0 - 8.0   Glucose, UA NEGATIVE  NEGATIVE mg/dL   Hgb urine dipstick SMALL (*) NEGATIVE   Bilirubin Urine NEGATIVE  NEGATIVE   Ketones, ur NEGATIVE  NEGATIVE mg/dL   Protein, ur 100 (*) NEGATIVE mg/dL   Urobilinogen, UA 1.0  0.0 - 1.0 mg/dL   Nitrite NEGATIVE  NEGATIVE   Leukocytes, UA  NEGATIVE  NEGATIVE  STREP PNEUMONIAE URINARY ANTIGEN     Status: None   Collection Time    09/06/13 10:39 PM      Result Value Range   Strep Pneumo Urinary Antigen NEGATIVE  NEGATIVE   Comment:            Infection due to S. pneumoniae     cannot be absolutely ruled out     since the antigen present     may be below the detection limit     of the test.  URINE MICROSCOPIC-ADD ON     Status: Abnormal   Collection Time    09/06/13 10:39 PM      Result Value Range   Squamous Epithelial / LPF FEW (*) RARE   RBC / HPF 0-2  <3 RBC/hpf   Bacteria, UA MANY (*) RARE   Casts GRANULAR CAST (*) NEGATIVE   Comment: HYALINE CASTS   Urine-Other AMORPHOUS URATES/PHOSPHATES    PROCALCITONIN     Status: None   Collection Time    09/07/13 12:00 AM      Result Value Range   Procalcitonin 1.50     Comment:  Interpretation:     PCT > 0.5 ng/mL and <= 2 ng/mL:     Systemic infection (sepsis) is possible,     but other conditions are known to elevate     PCT as well.     (NOTE)             ICU PCT Algorithm               Non ICU PCT Algorithm        ----------------------------     ------------------------------             PCT < 0.25 ng/mL                 PCT < 0.1 ng/mL         Stopping of antibiotics            Stopping of antibiotics           strongly encouraged.               strongly encouraged.        ----------------------------     ------------------------------           PCT level decrease by               PCT < 0.25 ng/mL           >= 80% from peak PCT           OR PCT 0.25 - 0.5 ng/mL          Stopping of antibiotics                                                 encouraged.         Stopping of antibiotics               encouraged.        ----------------------------     ------------------------------           PCT level decrease by              PCT >= 0.25 ng/mL           < 80% from peak PCT            AND PCT >= 0.5 ng/mL            Continuing antibiotics                                                   encouraged.           Continuing antibiotics                encouraged.        ----------------------------     ------------------------------         PCT level increase compared          PCT > 0.5 ng/mL             with peak PCT AND              PCT >= 0.5 ng/mL             Escalation of antibiotics  strongly encouraged.          Escalation of antibiotics            strongly encouraged.  CBC     Status: Abnormal   Collection Time    09/07/13 12:00 AM      Result Value Range   WBC 4.8  4.0 - 10.5 K/uL   RBC 3.38 (*) 4.22 - 5.81 MIL/uL   Hemoglobin 13.0  13.0 - 17.0 g/dL   HCT 36.2 (*) 39.0 - 52.0 %   MCV 107.1 (*) 78.0 - 100.0 fL   MCH 38.5 (*) 26.0 - 34.0 pg   MCHC 35.9  30.0 - 36.0 g/dL   RDW 12.9  11.5 - 15.5 %   Platelets 163  150 - 400 K/uL  CREATININE, SERUM     Status: Abnormal   Collection Time    09/07/13 12:00 AM      Result Value Range   Creatinine, Ser 1.59 (*) 0.50 - 1.35 mg/dL   GFR calc non Af Amer 44 (*) >90 mL/min   GFR calc Af Amer 51 (*) >90 mL/min   Comment: (NOTE)     The eGFR has been calculated using the CKD EPI equation.     This calculation has not been validated in all clinical situations.     eGFR's persistently <90 mL/min signify possible Chronic Kidney     Disease.  TROPONIN I     Status: None   Collection Time    09/07/13 12:00 AM      Result Value Range   Troponin I <0.30  <0.30 ng/mL   Comment:            Due to the release kinetics of cTnI,     a negative result within the first hours     of the onset of symptoms does not rule out     myocardial infarction with certainty.     If myocardial infarction is still suspected,     repeat the test at appropriate intervals.  PROTIME-INR     Status: Abnormal   Collection Time    09/07/13 12:00 AM      Result Value Range   Prothrombin Time 15.8 (*) 11.6 - 15.2 seconds   INR 1.29  0.00 - 1.49  MRSA PCR SCREENING      Status: None   Collection Time    09/07/13  1:13 AM      Result Value Range   MRSA by PCR NEGATIVE  NEGATIVE   Comment:            The GeneXpert MRSA Assay (FDA     approved for NASAL specimens     only), is one component of a     comprehensive MRSA colonization     surveillance program. It is not     intended to diagnose MRSA     infection nor to guide or     monitor treatment for     MRSA infections.  GLUCOSE, CAPILLARY     Status: Abnormal   Collection Time    09/07/13  1:15 AM      Result Value Range   Glucose-Capillary 214 (*) 70 - 99 mg/dL   Comment 1 Documented in Chart     Comment 2 Notify RN    GLUCOSE, CAPILLARY     Status: Abnormal   Collection Time    09/07/13  3:23 AM      Result Value Range   Glucose-Capillary 174 (*)  70 - 99 mg/dL   Comment 1 Documented in Chart     Comment 2 Notify RN    PROCALCITONIN     Status: None   Collection Time    09/07/13  3:45 AM      Result Value Range   Procalcitonin 1.65     Comment:            Interpretation:     PCT > 0.5 ng/mL and <= 2 ng/mL:     Systemic infection (sepsis) is possible,     but other conditions are known to elevate     PCT as well.     (NOTE)             ICU PCT Algorithm               Non ICU PCT Algorithm        ----------------------------     ------------------------------             PCT < 0.25 ng/mL                 PCT < 0.1 ng/mL         Stopping of antibiotics            Stopping of antibiotics           strongly encouraged.               strongly encouraged.        ----------------------------     ------------------------------           PCT level decrease by               PCT < 0.25 ng/mL           >= 80% from peak PCT           OR PCT 0.25 - 0.5 ng/mL          Stopping of antibiotics                                                 encouraged.         Stopping of antibiotics               encouraged.        ----------------------------     ------------------------------           PCT  level decrease by              PCT >= 0.25 ng/mL           < 80% from peak PCT            AND PCT >= 0.5 ng/mL            Continuing antibiotics                                                  encouraged.           Continuing antibiotics                encouraged.        ----------------------------     ------------------------------         PCT  level increase compared          PCT > 0.5 ng/mL             with peak PCT AND              PCT >= 0.5 ng/mL             Escalation of antibiotics                                              strongly encouraged.          Escalation of antibiotics            strongly encouraged.  TROPONIN I     Status: None   Collection Time    09/07/13  3:45 AM      Result Value Range   Troponin I <0.30  <0.30 ng/mL   Comment:            Due to the release kinetics of cTnI,     a negative result within the first hours     of the onset of symptoms does not rule out     myocardial infarction with certainty.     If myocardial infarction is still suspected,     repeat the test at appropriate intervals.  COMPREHENSIVE METABOLIC PANEL     Status: Abnormal   Collection Time    09/07/13  3:45 AM      Result Value Range   Sodium 135  135 - 145 mEq/L   Potassium 5.0  3.5 - 5.1 mEq/L   Chloride 107  96 - 112 mEq/L   CO2 14 (*) 19 - 32 mEq/L   Glucose, Bld 153 (*) 70 - 99 mg/dL   BUN 34 (*) 6 - 23 mg/dL   Creatinine, Ser 1.46 (*) 0.50 - 1.35 mg/dL   Calcium 7.1 (*) 8.4 - 10.5 mg/dL   Total Protein 6.6  6.0 - 8.3 g/dL   Albumin 2.3 (*) 3.5 - 5.2 g/dL   AST 306 (*) 0 - 37 U/L   ALT 189 (*) 0 - 53 U/L   Alkaline Phosphatase 91  39 - 117 U/L   Total Bilirubin 0.3  0.3 - 1.2 mg/dL   GFR calc non Af Amer 49 (*) >90 mL/min   GFR calc Af Amer 57 (*) >90 mL/min   Comment: (NOTE)     The eGFR has been calculated using the CKD EPI equation.     This calculation has not been validated in all clinical situations.     eGFR's persistently <90 mL/min signify possible  Chronic Kidney     Disease.  LACTIC ACID, PLASMA     Status: Abnormal   Collection Time    09/07/13  3:45 AM      Result Value Range   Lactic Acid, Venous 5.8 (*) 0.5 - 2.2 mmol/L  GLUCOSE, CAPILLARY     Status: None   Collection Time    09/07/13  7:42 AM      Result Value Range   Glucose-Capillary 93  70 - 99 mg/dL      Component Value Date/Time   SDES SPUTUM 03/21/2010 0509   SDES SPUTUM 03/21/2010 0509   SPECREQUEST NONE 03/21/2010 0509   SPECREQUEST NONE 03/21/2010 [place] 03/21/2010 0509   REPTSTATUS 03/21/2010 FINAL 03/21/2010 0509  REPTSTATUS 03/23/2010 FINAL 03/21/2010 0509   Dg Chest 2 View  09/06/2013   CLINICAL DATA:  Persistent cough for 3 weeks.  EXAM: CHEST  2 VIEW  COMPARISON:  Chest radiograph performed 08/23/2013  FINDINGS: The lungs are well-aerated. There is suspicion of left basilar airspace opacity, given the appearance of the lateral view. Chronic peribronchial thickening is noted. There is no evidence of pleural effusion or pneumothorax.  The heart is borderline normal in size. The patient is status post median sternotomy, with evidence of prior CABG. No acute osseous abnormalities are seen. Chronic right-sided rib deformities are seen. Clips are noted within the right upper quadrant, reflecting prior cholecystectomy.  IMPRESSION: Suspect left basilar airspace opacity, raising concern for mild pneumonia.   Electronically Signed   By: Garald Balding M.D.   On: 09/06/2013 07:00   Dg Tibia/fibula Left  09/07/2013   CLINICAL DATA:  Ulceration along the distal left lateral lower leg and ankle. Assess for osteomyelitis.  EXAM: LEFT TIBIA AND FIBULA - 2 VIEW  COMPARISON:  None.  FINDINGS: The patient's lower leg soft tissue ulceration is partially characterized. No underlying osseous erosion is seen to suggest osteomyelitis. The tibia and fibula appear intact. The ankle mortise is incompletely assessed, but appears grossly unremarkable.  The knee  joint is grossly unremarkable in appearance. Scattered vascular calcifications are seen.  IMPRESSION: 1. No osseous erosion seen to suggest osteomyelitis. The tibia and fibula remain intact. 2. Scattered vascular calcifications seen.   Electronically Signed   By: Garald Balding M.D.   On: 09/07/2013 01:17   Dg Chest Portable 1 View  09/06/2013   CLINICAL DATA:  Weakness, near syncope.  EXAM: PORTABLE CHEST - 1 VIEW  COMPARISON:  09/06/2013.  FINDINGS: Trachea is midline. Heart is enlarged and accentuated by AP supine technique. Probable vascular crowding with low lung volumes. No airspace consolidation or pleural fluid. Multiple old right rib fractures.  IMPRESSION: No acute findings.   Electronically Signed   By: Lorin Picket M.D.   On: 09/06/2013 21:49   Recent Results (from the past 240 hour(s))  MRSA PCR SCREENING     Status: None   Collection Time    09/07/13  1:13 AM      Result Value Range Status   MRSA by PCR NEGATIVE  NEGATIVE Final   Comment:            The GeneXpert MRSA Assay (FDA     approved for NASAL specimens     only), is one component of a     comprehensive MRSA colonization     surveillance program. It is not     intended to diagnose MRSA     infection nor to guide or     monitor treatment for     MRSA infections.      09/07/2013, 9:56 AM     LOS: 1 day

## 2013-09-07 NOTE — Progress Notes (Signed)
ANTIBIOTIC CONSULT NOTE - INITIAL  Pharmacy Consult for Vancomycin/Cefepime Indication: rule out pneumonia  Allergies  Allergen Reactions  . Amphetamines Other (See Comments)    unknown    Patient Measurements: Height: 6\' 2"  (188 cm) Weight: 175 lb (79.379 kg) IBW/kg (Calculated) : 82.2  Vital Signs: BP: 125/65 mmHg (11/14 2330) Pulse Rate: 71 (11/14 2330)  Labs:  Recent Labs  09/06/13 2125  WBC 4.1  HGB 14.2  PLT 164  CREATININE 1.94*   Estimated Creatinine Clearance: 43.2 ml/min (by C-G formula based on Cr of 1.94).  Medical History: Past Medical History  Diagnosis Date  . Syphilis, unspecified   . Coronary atherosclerosis of native coronary artery     s/p surgery  . Type II or unspecified type diabetes mellitus without mention of complication, not stated as uncontrolled 2004    Diagnosed in 2004. Came to our clinic in 5/07 and was on Glucotrol XL 10 mg BID and metformin 500 BID. Max A1C on those two meds was 6.9 on 03/10/08. He has been able to be removed from the meds while maintaining a good A1C. He has DM neuropathy (although neuropathy could be multifactorial.)  . Human immunodeficiency virus (HIV) disease   . Allergic rhinitis, cause unspecified   . Gout, unspecified   . Hyperlipidemia   . HTN (hypertension)   . Ischemic cardiomyopathy   . Drug abuse     hx; tested for cocaine as recently as 2/08. says he is not using drugs now - avoided defib. for this reason   . Acute maxillary sinusitis   . Backache, unspecified   . Coronary atherosclerosis of artery bypass graft   . Closed fracture of intertrochanteric section of femur   . Chronic kidney disease, unspecified   . Tobacco use disorder   . History of hepatitis C    Assessment: 62 y/o M here with blurry vision, to start empiric antibiotic coverage, WBC 4.1 (HIV), Scr 1.94 (CrCl ~40), afebrile.  Goal of Therapy:  Vancomycin trough level 15-20 mcg/ml  ED Antibiotics  Vanco x 1 CTX x 1 Azithro x  1  Plan:  -Vancomycin 1000 mg IV q24 starting today at 2300 -Cefepime 2g IV q24h -Azithromycin per MD -Trend WBC, temp, renal function  -F/U cultures, imaging -Drug levels as indicated   Thank you for allowing me to take part in this patient's care,  Narda Bonds, PharmD Clinical Pharmacist Phone: [phone] Pager: [phone] 09/07/2013 12:06 AM

## 2013-09-07 NOTE — ED Notes (Signed)
Patient wanted to keep his wallet and money order instead of locking it up in the safe.  Has kept all clothing with him.

## 2013-09-08 ENCOUNTER — Inpatient Hospital Stay (HOSPITAL_COMMUNITY): Payer: PRIVATE HEALTH INSURANCE

## 2013-09-08 DIAGNOSIS — I10 Essential (primary) hypertension: Secondary | ICD-10-CM

## 2013-09-08 DIAGNOSIS — I5042 Chronic combined systolic (congestive) and diastolic (congestive) heart failure: Secondary | ICD-10-CM

## 2013-09-08 DIAGNOSIS — I509 Heart failure, unspecified: Secondary | ICD-10-CM

## 2013-09-08 DIAGNOSIS — E871 Hypo-osmolality and hyponatremia: Secondary | ICD-10-CM

## 2013-09-08 DIAGNOSIS — M109 Gout, unspecified: Secondary | ICD-10-CM

## 2013-09-08 DIAGNOSIS — R197 Diarrhea, unspecified: Secondary | ICD-10-CM

## 2013-09-08 DIAGNOSIS — I517 Cardiomegaly: Secondary | ICD-10-CM

## 2013-09-08 DIAGNOSIS — E872 Acidosis: Secondary | ICD-10-CM

## 2013-09-08 LAB — EXPECTORATED SPUTUM ASSESSMENT W REFEX TO RESP CULTURE

## 2013-09-08 LAB — BASIC METABOLIC PANEL
Calcium: 7.8 mg/dL — ABNORMAL LOW (ref 8.4–10.5)
Creatinine, Ser: 1.18 mg/dL (ref 0.50–1.35)
GFR calc Af Amer: 74 mL/min — ABNORMAL LOW (ref >90)
GFR calc non Af Amer: 63 mL/min — ABNORMAL LOW (ref >90)
Sodium: 133 mEq/L — ABNORMAL LOW (ref 135–145)

## 2013-09-08 LAB — PROCALCITONIN: Procalcitonin: 1.9 ng/mL

## 2013-09-08 LAB — URINE CULTURE: Culture: NO GROWTH

## 2013-09-08 LAB — CBC
HCT: 38.1 % — ABNORMAL LOW (ref 39.0–52.0)
MCH: 38.8 pg — ABNORMAL HIGH (ref 26.0–34.0)
MCHC: 36 g/dL (ref 30.0–36.0)
MCV: 107.9 fL — ABNORMAL HIGH (ref 78.0–100.0)
Platelets: 153 10*3/uL (ref 150–400)
RDW: 12.7 % (ref 11.5–15.5)
WBC: 3.6 10*3/uL — ABNORMAL LOW (ref 4.0–10.5)

## 2013-09-08 LAB — GLUCOSE, CAPILLARY
Glucose-Capillary: 147 mg/dL — ABNORMAL HIGH (ref 70–99)
Glucose-Capillary: 283 mg/dL — ABNORMAL HIGH (ref 70–99)

## 2013-09-08 LAB — EXPECTORATED SPUTUM ASSESSMENT W GRAM STAIN, RFLX TO RESP C

## 2013-09-08 LAB — MAGNESIUM: Magnesium: 1.4 mg/dL — ABNORMAL LOW (ref 1.5–2.5)

## 2013-09-08 LAB — PHOSPHORUS: Phosphorus: 2.1 mg/dL — ABNORMAL LOW (ref 2.3–4.6)

## 2013-09-08 MED ORDER — LOPERAMIDE HCL 2 MG PO CAPS: 2.0000 mg | ORAL_CAPSULE | ORAL | Status: DC | PRN

## 2013-09-08 MED ORDER — ALLOPURINOL 300 MG PO TABS
300.0000 mg | ORAL_TABLET | Freq: Every day | ORAL | Status: DC
  Administered 2013-09-08 – 2013-09-10 (×3): 300 mg via ORAL
  Filled 2013-09-08 (×3): qty 1

## 2013-09-08 MED ORDER — CARVEDILOL 25 MG PO TABS
25.0000 mg | ORAL_TABLET | Freq: Two times a day (BID) | ORAL | Status: DC
  Administered 2013-09-08 – 2013-09-10 (×4): 25 mg via ORAL
  Filled 2013-09-08 (×6): qty 1

## 2013-09-08 MED ORDER — AZITHROMYCIN 250 MG PO TABS
250.0000 mg | ORAL_TABLET | Freq: Every day | ORAL | Status: DC
  Administered 2013-09-08 – 2013-09-09 (×2): 250 mg via ORAL
  Filled 2013-09-08 (×2): qty 1

## 2013-09-08 MED ORDER — LOPERAMIDE HCL 2 MG PO CAPS
4.0000 mg | ORAL_CAPSULE | Freq: Once | ORAL | Status: AC
  Administered 2013-09-08: 4 mg via ORAL
  Filled 2013-09-08: qty 2

## 2013-09-08 NOTE — ED Provider Notes (Signed)
I saw and evaluated the patient, reviewed the resident's note and I agree with the findings and plan.   .Face to face Exam:  General:  Awake HEENT:  Atraumatic Resp:  Normal effort Abd:  Nondistended Neuro:No focal weakness  CRITICAL CARE Performed by: Leonard Schwartz L Total critical care time:45 min Critical care time was exclusive of separately billable procedures and treating other patients. Critical care was necessary to treat or prevent imminent or life-threatening deterioration. Critical care was time spent personally by me on the following activities: development of treatment plan with patient and/or surrogate as well as nursing, discussions with consultants, evaluation of patient's response to treatment, examination of patient, obtaining history from patient or surrogate, ordering and performing treatments and interventions, ordering and review of laboratory studies, ordering and review of radiographic studies, pulse oximetry and re-evaluation of patient's condition.   Dot Lanes, MD 09/08/13 1332

## 2013-09-08 NOTE — Progress Notes (Signed)
continous pulse oximetry initiated.99% on room air.

## 2013-09-08 NOTE — Progress Notes (Signed)
Patient awake most of night. Strong productive cough. Sputum culture collected and sent down. Lab stated insufficient sample.

## 2013-09-08 NOTE — Progress Notes (Signed)
INFECTIOUS DISEASE PROGRESS NOTE  ID: Jacob [name] is a 62 y.o. male with  Principal Problem:   Shock Active Problems:   HIV DISEASE   DM II, diet controlled   Ulcer of lower extremity   Community acquired pneumonia   Hyponatremia   Hyperkalemia   Acute kidney injury   Hyperglycemia   Elevated transaminase level   Chest pain  Subjective: Without complaints, feels some better. Had 2 loose BM today.   Abtx:  Anti-infectives   Start     Dose/Rate Route Frequency Ordered Stop   09/07/13 2300  azithromycin (ZITHROMAX) 250 mg in dextrose 5 % 125 mL IVPB     250 mg 125 mL/hr over 60 Minutes Intravenous Every 24 hours 09/07/13 0000 09/10/13 2259   09/07/13 2300  vancomycin (VANCOCIN) IVPB 1000 mg/200 mL premix  Status:  Discontinued     1,000 mg 200 mL/hr over 60 Minutes Intravenous Every 24 hours 09/07/13 0005 09/07/13 1157   09/07/13 1600  ceFEPIme (MAXIPIME) 1 g in dextrose 5 % 50 mL IVPB     1 g 100 mL/hr over 30 Minutes Intravenous Every 8 hours 09/07/13 1158     09/07/13 1300  vancomycin (VANCOCIN) IVPB 750 mg/150 ml premix     750 mg 150 mL/hr over 60 Minutes Intravenous Every 12 hours 09/07/13 1158     09/07/13 0800  atazanavir (REYATAZ) capsule 400 mg     400 mg Oral Daily with breakfast 09/06/13 2301     09/07/13 0800  ceFEPIme (MAXIPIME) 2 g in dextrose 5 % 50 mL IVPB  Status:  Discontinued     2 g 100 mL/hr over 30 Minutes Intravenous Every 24 hours 09/07/13 0005 09/07/13 1158   09/07/13 0200  lamiVUDine-zidovudine (COMBIVIR) 150-300 MG per tablet 1 tablet     1 tablet Oral 2 times daily 09/06/13 2301     09/07/13 0000  vancomycin (VANCOCIN) IVPB 1000 mg/200 mL premix  Status:  Discontinued     1,000 mg 200 mL/hr over 60 Minutes Intravenous Every 24 hours 09/06/13 2353 09/07/13 0005   09/07/13 0000  ceFEPIme (MAXIPIME) 2 g in dextrose 5 % 50 mL IVPB  Status:  Discontinued     2 g 100 mL/hr over 30 Minutes Intravenous Every 12 hours 09/06/13 2353 09/07/13 0005    09/07/13 0000  azithromycin (ZITHROMAX) 250 mg in dextrose 5 % 125 mL IVPB  Status:  Discontinued     250 mg 125 mL/hr over 60 Minutes Intravenous Every 24 hours 09/06/13 2353 09/07/13 0000   09/06/13 2300  vancomycin (VANCOCIN) 1,500 mg in sodium chloride 0.9 % 500 mL IVPB     1,500 mg 250 mL/hr over 120 Minutes Intravenous  Once 09/06/13 2236 09/07/13 0155   09/06/13 2215  cefTRIAXone (ROCEPHIN) 1 g in dextrose 5 % 50 mL IVPB     1 g 100 mL/hr over 30 Minutes Intravenous  Once 09/06/13 2213 09/06/13 2311   09/06/13 2215  azithromycin (ZITHROMAX) 500 mg in dextrose 5 % 250 mL IVPB     500 mg 250 mL/hr over 60 Minutes Intravenous  Once 09/06/13 2213 09/07/13 0039      Medications:  Scheduled: . aspirin EC  81 mg Oral Daily  . atazanavir  400 mg Oral Q breakfast  . atorvastatin  40 mg Oral Daily  . azithromycin  250 mg Intravenous Q24H  . ceFEPime (MAXIPIME) IV  1 g Intravenous Q8H  . enalapril  10 mg Oral BID  .  heparin  5,000 Units Subcutaneous Q8H  . insulin aspart  0-9 Units Subcutaneous Q4H  . lamiVUDine-zidovudine  1 tablet Oral BID  . latanoprost  1 drop Both Eyes Daily  . predniSONE  40 mg Oral Q breakfast  . vancomycin  750 mg Intravenous Q12H    Objective: Vital signs in last 24 hours: Temp:  [98 F (36.7 C)-98.1 F (36.7 C)] 98.1 F (36.7 C) (11/16 0629) Pulse Rate:  [67-97] 67 (11/16 0629) Resp:  [18-20] 18 (11/16 0629) BP: (157-179)/(81-95) 178/95 mmHg (11/16 1017) SpO2:  [95 %-99 %] 99 % (11/16 0629) Weight:  [83.28 kg (183 lb 9.6 oz)] 83.28 kg (183 lb 9.6 oz) (11/16 0629)   General appearance: alert, cooperative and no distress Resp: clear to auscultation bilaterally Cardio: regular rate and rhythm GI: normal findings: bowel sounds normal and soft, non-tender Extremities: LLE ulcer dressed.   Lab Results  Recent Labs  09/07/13  09/07/13 1240 09/08/13 0600  WBC 4.8  --   --  3.6*  HGB 13.0  --   --  13.7  HCT 36.2*  --   --  38.1*  NA  --   <  > 134* 133*  K  --   < > 4.7 3.9  CL  --   < > 106 102  CO2  --   < > 17* 19  BUN  --   < > 31* 20  CREATININE 1.59*  < > 1.37* 1.18  < > = values in this interval not displayed. Liver Panel  Recent Labs  09/07/13 0345 09/07/13 1240  PROT 6.6 7.2  ALBUMIN 2.3* 2.6*  AST 306* 369*  ALT 189* 222*  ALKPHOS 91 95  BILITOT 0.3 1.0   Sedimentation Rate No results found for this basename: ESRSEDRATE,  in the last 72 hours C-Reactive Protein No results found for this basename: CRP,  in the last 72 hours  Microbiology: Recent Results (from the past 240 hour(s))  URINE CULTURE     Status: None   Collection Time    09/06/13 10:39 PM      Result Value Range Status   Specimen Description URINE, CATHETERIZED   Final   Special Requests NONE   Final   Culture  Setup Time     Final   Value: 09/07/2013 01:31     Performed at [facility]   Culture     Final   Value: NO GROWTH     Performed at [facility]   Report Status 09/08/2013 FINAL   Final  MRSA PCR SCREENING     Status: None   Collection Time    09/07/13  1:13 AM      Result Value Range Status   MRSA by PCR NEGATIVE  NEGATIVE Final   Comment:            The GeneXpert MRSA Assay (FDA     approved for NASAL specimens     only), is one component of a     comprehensive MRSA colonization     surveillance program. It is not     intended to diagnose MRSA     infection nor to guide or     monitor treatment for     MRSA infections.  CLOSTRIDIUM DIFFICILE BY PCR     Status: None   Collection Time    09/07/13  6:03 PM      Result Value Range Status   C difficile by pcr NEGATIVE  NEGATIVE Final  CULTURE, EXPECTORATED SPUTUM-ASSESSMENT     Status: None   Collection Time    09/07/13  9:28 PM      Result Value Range Status   Specimen Description SPUTUM   Final   Special Requests Immunocompromised   Final   Sputum evaluation     Final   Value: MICROSCOPIC FINDINGS SUGGEST THAT THIS SPECIMEN IS NOT  REPRESENTATIVE OF LOWER RESPIRATORY SECRETIONS. PLEASE RECOLLECT.     CALLED TO [facility] RN [id number] [place], S   Report Status 09/07/2013 FINAL   Final    Studies/Results: Dg Tibia/fibula Left  09/07/2013   CLINICAL DATA:  Ulceration along the distal left lateral lower leg and ankle. Assess for osteomyelitis.  EXAM: LEFT TIBIA AND FIBULA - 2 VIEW  COMPARISON:  None.  FINDINGS: The patient's lower leg soft tissue ulceration is partially characterized. No underlying osseous erosion is seen to suggest osteomyelitis. The tibia and fibula appear intact. The ankle mortise is incompletely assessed, but appears grossly unremarkable.  The knee joint is grossly unremarkable in appearance. Scattered vascular calcifications are seen.  IMPRESSION: 1. No osseous erosion seen to suggest osteomyelitis. The tibia and fibula remain intact. 2. Scattered vascular calcifications seen.   Electronically Signed   By: Garald Balding M.D.   On: 09/07/2013 01:17   Dg Chest Port 1 View  09/08/2013   CLINICAL DATA:  Followup of pneumonia.  EXAM: PORTABLE CHEST - 1 VIEW  COMPARISON:  09/06/2013  FINDINGS: Prior median sternotomy. Extensive remote right rib fractures. Midline trachea. Mild cardiomegaly with atherosclerosis in the transverse aorta. No pleural effusion or pneumothorax. Clear lungs.  IMPRESSION: No evidence of pneumonia.  Cardiomegaly without congestive failure.   Electronically Signed   By: Abigail Miyamoto M.D.   On: 09/08/2013 08:12   Dg Chest Portable 1 View  09/06/2013   CLINICAL DATA:  Weakness, near syncope.  EXAM: PORTABLE CHEST - 1 VIEW  COMPARISON:  09/06/2013.  FINDINGS: Trachea is midline. Heart is enlarged and accentuated by AP supine technique. Probable vascular crowding with low lung volumes. No airspace consolidation or pleural fluid. Multiple old right rib fractures.  IMPRESSION: No acute findings.   Electronically Signed   By: Lorin Picket M.D.   On: 09/06/2013 21:49      Assessment/Plan: HIV+  Community Acquired Pneumonia  Hypotension, shock  Uncontrolled HTN ARF on CRI  Ischemic CM  LLE ulcer, chronic  Total days of antibiotics: 3 (vanco/azithro/cefepime)  Will change azithro to po. Await CD4, HIV RNA, HLA testing. Cr now normal.  Stool C diff (-), can come out of isolation.  Sputum Cx pending.  Consider stop vanco soon, steroid taper.  TTE pending (to f/u Isch CM)          Bobby Rumpf Infectious Diseases (pager) [phone] [email or web address] 09/08/2013, 11:18 AM  LOS: 2 days

## 2013-09-08 NOTE — Progress Notes (Signed)
Echo Lab  2D Echocardiogram completed.  [place], RDCS 09/08/2013 12:32 PM

## 2013-09-08 NOTE — Progress Notes (Addendum)
Removed continuous pulse oximetry. Patient states too many wires. Patient states monitor on finger is giving him a headache. Removed per patient request.99% on room air. Diabetic ulcer on left lower medial leg. Foam dressing.

## 2013-09-08 NOTE — Progress Notes (Signed)
Chart reviewed.  Subjective: Worried about high blood pressure.  C/o diarrhea.  No N/V/abd pain.  No dyspnea or wheeze.  Appetite and strength improving.  Objective: Vital signs in last 24 hours: Temp:  [98.1 F (36.7 C)-98.2 F (36.8 C)] 98.2 F (36.8 C) (11/16 1435) Pulse Rate:  [67-82] 82 (11/16 1435) Resp:  [18] 18 (11/16 0629) BP: (157-183)/(81-105) 183/105 mmHg (11/16 1435) SpO2:  [95 %-99 %] 97 % (11/16 1435) Weight:  [83.28 kg (183 lb 9.6 oz)] 83.28 kg (183 lb 9.6 oz) (11/16 0629) Weight change: 3.901 kg (8 lb 9.6 oz) Last BM Date: 09/07/13  Intake/Output from previous day: 11/15 0701 - 11/16 0700 In: 1897 [P.O.:822; I.V.:500; IV Piggyback:575] Out: 1250 [Urine:1250] Intake/Output this shift: Total I/O In: -  Out: 1 [Stool:1]  Gen: comfortable sitting at edge of bed.  Non-toxic. Breathing nonlabored. Lungs: CTA without WRR CV: RRR without MGR Abd: S, NT, ND Ext no edema. Bandage at right lateral ankle  Lab Results:  Recent Labs  09/07/13 09/08/13 0600  WBC 4.8 3.6*  HGB 13.0 13.7  HCT 36.2* 38.1*  PLT 163 153   BMET  Recent Labs  09/07/13 1240 09/08/13 0600  NA 134* 133*  K 4.7 3.9  CL 106 102  CO2 17* 19  GLUCOSE 153* 113*  BUN 31* 20  CREATININE 1.37* 1.18  CALCIUM 7.5* 7.8*    Studies/Results: Dg Tibia/fibula Left  09/07/2013   CLINICAL DATA:  Ulceration along the distal left lateral lower leg and ankle. Assess for osteomyelitis.  EXAM: LEFT TIBIA AND FIBULA - 2 VIEW  COMPARISON:  None.  FINDINGS: The patient's lower leg soft tissue ulceration is partially characterized. No underlying osseous erosion is seen to suggest osteomyelitis. The tibia and fibula appear intact. The ankle mortise is incompletely assessed, but appears grossly unremarkable.  The knee joint is grossly unremarkable in appearance. Scattered vascular calcifications are seen.  IMPRESSION: 1. No osseous erosion seen to suggest osteomyelitis. The tibia and fibula remain intact.  2. Scattered vascular calcifications seen.   Electronically Signed   By: Garald Balding M.D.   On: 09/07/2013 01:17   Dg Chest Port 1 View  09/08/2013   CLINICAL DATA:  Followup of pneumonia.  EXAM: PORTABLE CHEST - 1 VIEW  COMPARISON:  09/06/2013  FINDINGS: Prior median sternotomy. Extensive remote right rib fractures. Midline trachea. Mild cardiomegaly with atherosclerosis in the transverse aorta. No pleural effusion or pneumothorax. Clear lungs.  IMPRESSION: No evidence of pneumonia.  Cardiomegaly without congestive failure.   Electronically Signed   By: Abigail Miyamoto M.D.   On: 09/08/2013 08:12   Dg Chest Portable 1 View  09/06/2013   CLINICAL DATA:  Weakness, near syncope.  EXAM: PORTABLE CHEST - 1 VIEW  COMPARISON:  09/06/2013.  FINDINGS: Trachea is midline. Heart is enlarged and accentuated by AP supine technique. Probable vascular crowding with low lung volumes. No airspace consolidation or pleural fluid. Multiple old right rib fractures.  IMPRESSION: No acute findings.   Electronically Signed   By: Lorin Picket M.D.   On: 09/06/2013 21:49   Echo Left ventricle: The cavity size was moderately dilated. Wall thickness was increased in a pattern of mild LVH. Systolic function was moderately reduced. The estimated ejection fraction was in the range of 35% to 40%. There is severe hypokinesis of the entireinferior myocardium. Doppler parameters are consistent with abnormal left ventricular relaxation (grade 1 diastolic dysfunction). - Left atrium: The atrium was mildly dilated. - Right atrium: The atrium  was mildly dilated.  Medications:  Scheduled Meds: . allopurinol  300 mg Oral Daily  . aspirin EC  81 mg Oral Daily  . atazanavir  400 mg Oral Q breakfast  . atorvastatin  40 mg Oral Daily  . azithromycin  250 mg Oral Daily  . carvedilol  25 mg Oral BID WC  . ceFEPime (MAXIPIME) IV  1 g Intravenous Q8H  . enalapril  10 mg Oral BID  . heparin  5,000 Units Subcutaneous Q8H  .  insulin aspart  0-9 Units Subcutaneous Q4H  . lamiVUDine-zidovudine  1 tablet Oral BID  . latanoprost  1 drop Both Eyes Daily  . vancomycin  750 mg Intravenous Q12H   Continuous Infusions:  PRN Meds:.sodium chloride, albuterol, chlorpheniramine-HYDROcodone, loperamide, nitroGLYCERIN  Assessment/Plan:  Principal Problem:   Shock: resolved. Now with malignant hypertension.  D/c prednisone, as no wheeze.  Resume coreg.  Already back on vasotec.  Change to PO abx? Defer to ID. Looks great! Active Problems:   HIV DISEASE   HYPERLIPIDEMIA   GOUT: resume allopurinol   HYPERTENSION   CORONARY ATHEROSCLEROSIS NATIVE CORONARY ARTERY   Type II or unspecified type diabetes mellitus with unspecified complication, not stated as uncontrolled: metformin held    Ulcer of lower extremity   Community acquired pneumonia   Hyponatremia   Hyperkalemia resolved   Acute kidney injury resolved Probable COPD: no wheeze or dyspnea currently   Elevated transaminase level   Diarrhea: c diff negative.  Prn immodium Chronic systolic HF: compensated.  Echo shows EF 35-40%, which is consistent with most previous echos.   LOS: 2 days   Jacob [name] 09/08/2013, 5:54 PM

## 2013-09-09 DIAGNOSIS — L97909 Non-pressure chronic ulcer of unspecified part of unspecified lower leg with unspecified severity: Secondary | ICD-10-CM

## 2013-09-09 DIAGNOSIS — A419 Sepsis, unspecified organism: Secondary | ICD-10-CM

## 2013-09-09 LAB — GLUCOSE, CAPILLARY
Glucose-Capillary: 126 mg/dL — ABNORMAL HIGH (ref 70–99)
Glucose-Capillary: 160 mg/dL — ABNORMAL HIGH (ref 70–99)
Glucose-Capillary: 291 mg/dL — ABNORMAL HIGH (ref 70–99)

## 2013-09-09 LAB — T-HELPER CELLS (CD4) COUNT (NOT AT ARMC): CD4 T Cell Abs: 580 /uL (ref 400–2700)

## 2013-09-09 MED ORDER — LEVOFLOXACIN 750 MG PO TABS
750.0000 mg | ORAL_TABLET | Freq: Every day | ORAL | Status: DC
  Administered 2013-09-09 – 2013-09-10 (×2): 750 mg via ORAL
  Filled 2013-09-09 (×2): qty 1

## 2013-09-09 MED ORDER — BENZONATATE 100 MG PO CAPS
200.0000 mg | ORAL_CAPSULE | Freq: Once | ORAL | Status: AC
  Administered 2013-09-09: 200 mg via ORAL
  Filled 2013-09-09: qty 2

## 2013-09-09 NOTE — Progress Notes (Signed)
[facility]  Day # 4 vancomycin Day # 3 cefepime (had one day rocephin) Day # 4 azithromycin  Subjective: No new complaints   Antibiotics:  Anti-infectives   Start     Dose/Rate Route Frequency Ordered Stop   09/09/13 1300  levofloxacin (LEVAQUIN) tablet 750 mg     750 mg Oral Daily 09/09/13 1120     09/08/13 1400  azithromycin (ZITHROMAX) tablet 250 mg  Status:  Discontinued     250 mg Oral Daily 09/08/13 1123 09/09/13 1119   09/07/13 2300  azithromycin (ZITHROMAX) 250 mg in dextrose 5 % 125 mL IVPB  Status:  Discontinued     250 mg 125 mL/hr over 60 Minutes Intravenous Every 24 hours 09/07/13 0000 09/08/13 1123   09/07/13 2300  vancomycin (VANCOCIN) IVPB 1000 mg/200 mL premix  Status:  Discontinued     1,000 mg 200 mL/hr over 60 Minutes Intravenous Every 24 hours 09/07/13 0005 09/07/13 1157   09/07/13 1600  ceFEPIme (MAXIPIME) 1 g in dextrose 5 % 50 mL IVPB  Status:  Discontinued     1 g 100 mL/hr over 30 Minutes Intravenous Every 8 hours 09/07/13 1158 09/09/13 1119   09/07/13 1300  vancomycin (VANCOCIN) IVPB 750 mg/150 ml premix  Status:  Discontinued     750 mg 150 mL/hr over 60 Minutes Intravenous Every 12 hours 09/07/13 1158 09/09/13 1119   09/07/13 0800  atazanavir (REYATAZ) capsule 400 mg     400 mg Oral Daily with breakfast 09/06/13 2301     09/07/13 0800  ceFEPIme (MAXIPIME) 2 g in dextrose 5 % 50 mL IVPB  Status:  Discontinued     2 g 100 mL/hr over 30 Minutes Intravenous Every 24 hours 09/07/13 0005 09/07/13 1158   09/07/13 0200  lamiVUDine-zidovudine (COMBIVIR) 150-300 MG per tablet 1 tablet     1 tablet Oral 2 times daily 09/06/13 2301     09/07/13 0000  vancomycin (VANCOCIN) IVPB 1000 mg/200 mL premix  Status:  Discontinued     1,000 mg 200 mL/hr over 60 Minutes Intravenous Every 24 hours 09/06/13 2353 09/07/13 0005   09/07/13 0000  ceFEPIme (MAXIPIME) 2 g in dextrose 5 % 50 mL IVPB  Status:  Discontinued     2 g 100 mL/hr over 30  Minutes Intravenous Every 12 hours 09/06/13 2353 09/07/13 0005   09/07/13 0000  azithromycin (ZITHROMAX) 250 mg in dextrose 5 % 125 mL IVPB  Status:  Discontinued     250 mg 125 mL/hr over 60 Minutes Intravenous Every 24 hours 09/06/13 2353 09/07/13 0000   09/06/13 2300  vancomycin (VANCOCIN) 1,500 mg in sodium chloride 0.9 % 500 mL IVPB     1,500 mg 250 mL/hr over 120 Minutes Intravenous  Once 09/06/13 2236 09/07/13 0155   09/06/13 2215  cefTRIAXone (ROCEPHIN) 1 g in dextrose 5 % 50 mL IVPB     1 g 100 mL/hr over 30 Minutes Intravenous  Once 09/06/13 2213 09/06/13 2311   09/06/13 2215  azithromycin (ZITHROMAX) 500 mg in dextrose 5 % 250 mL IVPB     500 mg 250 mL/hr over 60 Minutes Intravenous  Once 09/06/13 2213 09/07/13 0039      Medications: Scheduled Meds: . allopurinol  300 mg Oral Daily  . aspirin EC  81 mg Oral Daily  . atazanavir  400 mg Oral Q breakfast  . atorvastatin  40 mg Oral Daily  . carvedilol  25 mg Oral BID WC  .  enalapril  10 mg Oral BID  . heparin  5,000 Units Subcutaneous Q8H  . insulin aspart  0-9 Units Subcutaneous Q4H  . lamiVUDine-zidovudine  1 tablet Oral BID  . latanoprost  1 drop Both Eyes Daily  . levofloxacin  750 mg Oral Daily   Continuous Infusions:  PRN Meds:.sodium chloride, albuterol, chlorpheniramine-HYDROcodone, loperamide, nitroGLYCERIN   Objective: Weight change: -4 lb 12.8 oz (-2.177 kg)  Intake/Output Summary (Last 24 hours) at 09/09/13 1421 Last data filed at 09/09/13 1400  Gross per 24 hour  Intake   1172 ml  Output   1600 ml  Net   -428 ml   Blood pressure 112/69, pulse 78, temperature 98 F (36.7 C), temperature source Oral, resp. rate 18, height 6\' 2"  (1.88 m), weight 178 lb 12.8 oz (81.103 kg), SpO2 98.00%. Temp:  [98 F (36.7 C)-98.2 F (36.8 C)] 98 F (36.7 C) (11/17 1356) Pulse Rate:  [62-82] 78 (11/17 1356) Resp:  [18] 18 (11/17 1356) BP: (112-183)/(69-105) 112/69 mmHg (11/17 1356) SpO2:  [97 %-98 %] 98 % (11/17  1356) Weight:  [178 lb 12.8 oz (81.103 kg)] 178 lb 12.8 oz (81.103 kg) (11/17 0540)  Physical Exam: General: Alert and awake, oriented x3, not in any acute distress. HEENT: anicteric sclera, pupils reactive to light and accommodation, EOMI CVS regular rate, normal r,  no murmur rubs or gallops Chest: slightly diminished bs at bases but otherwise, clear to auscultation bilaterally, no wheezing, rales or rhonchi Abdomen: soft nontender, nondistended, normal bowel sounds, Neuro: nonfocal  Lab Results:  Recent Labs  09/07/13 09/08/13 0600  WBC 4.8 3.6*  HGB 13.0 13.7  HCT 36.2* 38.1*  PLT 163 153    BMET  Recent Labs  09/07/13 1240 09/08/13 0600  NA 134* 133*  K 4.7 3.9  CL 106 102  CO2 17* 19  GLUCOSE 153* 113*  BUN 31* 20  CREATININE 1.37* 1.18  CALCIUM 7.5* 7.8*    Micro Results: Recent Results (from the past 240 hour(s))  CULTURE, BLOOD (ROUTINE X 2)     Status: None   Collection Time    09/06/13  9:35 PM      Result Value Range Status   Specimen Description BLOOD FOREARM LEFT   Final   Special Requests BOTTLES DRAWN AEROBIC AND ANAEROBIC 10CC   Final   Culture  Setup Time     Final   Value: 09/07/2013 05:00     Performed at [facility]   Culture     Final   Value:        BLOOD CULTURE RECEIVED NO GROWTH TO DATE CULTURE WILL BE HELD FOR 5 DAYS BEFORE ISSUING A FINAL NEGATIVE REPORT     Performed at [facility]   Report Status PENDING   Incomplete  CULTURE, BLOOD (ROUTINE X 2)     Status: None   Collection Time    09/06/13  9:35 PM      Result Value Range Status   Specimen Description BLOOD FOREARM RIGHT   Final   Special Requests BOTTLES DRAWN AEROBIC AND ANAEROBIC 4CC   Final   Culture  Setup Time     Final   Value: 09/07/2013 05:01     Performed at [facility]   Culture     Final   Value:        BLOOD CULTURE RECEIVED NO GROWTH TO DATE CULTURE WILL BE HELD FOR 5 DAYS BEFORE ISSUING A FINAL NEGATIVE REPORT  Performed at  [facility]   Report Status PENDING   Incomplete  URINE CULTURE     Status: None   Collection Time    09/06/13 10:39 PM      Result Value Range Status   Specimen Description URINE, CATHETERIZED   Final   Special Requests NONE   Final   Culture  Setup Time     Final   Value: 09/07/2013 01:31     Performed at [facility]   Culture     Final   Value: NO GROWTH     Performed at [facility]   Report Status 09/08/2013 FINAL   Final  MRSA PCR SCREENING     Status: None   Collection Time    09/07/13  1:13 AM      Result Value Range Status   MRSA by PCR NEGATIVE  NEGATIVE Final   Comment:            The GeneXpert MRSA Assay (FDA     approved for NASAL specimens     only), is one component of a     comprehensive MRSA colonization     surveillance program. It is not     intended to diagnose MRSA     infection nor to guide or     monitor treatment for     MRSA infections.  CLOSTRIDIUM DIFFICILE BY PCR     Status: None   Collection Time    09/07/13  6:03 PM      Result Value Range Status   C difficile by pcr NEGATIVE  NEGATIVE Final  CULTURE, EXPECTORATED SPUTUM-ASSESSMENT     Status: None   Collection Time    09/07/13  9:28 PM      Result Value Range Status   Specimen Description SPUTUM   Final   Special Requests Immunocompromised   Final   Sputum evaluation     Final   Value: MICROSCOPIC FINDINGS SUGGEST THAT THIS SPECIMEN IS NOT REPRESENTATIVE OF LOWER RESPIRATORY SECRETIONS. PLEASE RECOLLECT.     CALLED TO [facility] Foot RN [id number] [place], S   Report Status 09/07/2013 FINAL   Final  CULTURE, EXPECTORATED SPUTUM-ASSESSMENT     Status: None   Collection Time    09/08/13  9:04 AM      Result Value Range Status   Specimen Description SPUTUM   Final   Special Requests NONE   Final   Sputum evaluation     Final   Value: THIS SPECIMEN IS ACCEPTABLE. RESPIRATORY CULTURE REPORT TO FOLLOW.   Report Status 09/08/2013 FINAL   Final  CULTURE,  RESPIRATORY (NON-EXPECTORATED)     Status: None   Collection Time    09/08/13  9:04 AM      Result Value Range Status   Specimen Description SPUTUM   Final   Special Requests NONE   Final   Gram Stain PENDING   Incomplete   Culture     Final   Value: Culture reincubated for better growth     Performed at [facility]   Report Status PENDING   Incomplete    Studies/Results: Dg Chest Port 1 View  09/08/2013   CLINICAL DATA:  Followup of pneumonia.  EXAM: PORTABLE CHEST - 1 VIEW  COMPARISON:  09/06/2013  FINDINGS: Prior median sternotomy. Extensive remote right rib fractures. Midline trachea. Mild cardiomegaly with atherosclerosis in the transverse aorta. No pleural effusion or pneumothorax. Clear lungs.  IMPRESSION: No evidence of  pneumonia.  Cardiomegaly without congestive failure.   Electronically Signed   By: Abigail Miyamoto M.D.   On: 09/08/2013 08:12      Assessment/Plan: Jacob [name] is a 61 y.o. male admitted with CAP, sepsis , lower extremity ulcers  #1 CAP: His admission CXR and subsequent is not overwhelming but clinical picture concerning for septic shock  --I am narrowing to levaquin  #2 Lower extremity ulcers: I will take a closer look at these tomorrow  #3 HIV: has been perfectly suppressed on reyataz, combivir he claims since 1996 and I can only find one viral blip     LOS: 3 days   Alcide Evener 09/09/2013, 2:21 PM

## 2013-09-09 NOTE — Progress Notes (Signed)
Subjective: Diarrhea improved. No other complaints  Objective: Vital signs in last 24 hours: Temp:  [98 F (36.7 C)-98.1 F (36.7 C)] 98 F (36.7 C) (11/17 1356) Pulse Rate:  [62-79] 78 (11/17 1356) Resp:  [18] 18 (11/17 1356) BP: (112-178)/(69-92) 112/69 mmHg (11/17 1356) SpO2:  [97 %-98 %] 98 % (11/17 1356) Weight:  [81.103 kg (178 lb 12.8 oz)] 81.103 kg (178 lb 12.8 oz) (11/17 0540) Weight change: -2.177 kg (-4 lb 12.8 oz) Last BM Date: 09/08/13  Intake/Output from previous day: 11/16 0701 - 11/17 0700 In: 472 [P.O.:222; IV Piggyback:250] Out: 401 [Urine:400; Stool:1] Intake/Output this shift: Total I/O In: 850 [P.O.:770; I.V.:80] Out: 1200 [Urine:1200]  Gen: comfortable   Non-toxic. Breathing nonlabored. Lungs: CTA without WRR CV: RRR without MGR Abd: S, NT, ND Ext no edema. Bandage at right lateral ankle  Lab Results:  Recent Labs  09/07/13 09/08/13 0600  WBC 4.8 3.6*  HGB 13.0 13.7  HCT 36.2* 38.1*  PLT 163 153   BMET  Recent Labs  09/07/13 1240 09/08/13 0600  NA 134* 133*  K 4.7 3.9  CL 106 102  CO2 17* 19  GLUCOSE 153* 113*  BUN 31* 20  CREATININE 1.37* 1.18  CALCIUM 7.5* 7.8*    Studies/Results: Dg Chest Port 1 View  09/08/2013   CLINICAL DATA:  Followup of pneumonia.  EXAM: PORTABLE CHEST - 1 VIEW  COMPARISON:  09/06/2013  FINDINGS: Prior median sternotomy. Extensive remote right rib fractures. Midline trachea. Mild cardiomegaly with atherosclerosis in the transverse aorta. No pleural effusion or pneumothorax. Clear lungs.  IMPRESSION: No evidence of pneumonia.  Cardiomegaly without congestive failure.   Electronically Signed   By: Abigail Miyamoto M.D.   On: 09/08/2013 08:12   Echo Left ventricle: The cavity size was moderately dilated. Wall thickness was increased in a pattern of mild LVH. Systolic function was moderately reduced. The estimated ejection fraction was in the range of 35% to 40%. There is severe hypokinesis of the  entireinferior myocardium. Doppler parameters are consistent with abnormal left ventricular relaxation (grade 1 diastolic dysfunction). - Left atrium: The atrium was mildly dilated. - Right atrium: The atrium was mildly dilated.  Medications:  Scheduled Meds: . allopurinol  300 mg Oral Daily  . aspirin EC  81 mg Oral Daily  . atazanavir  400 mg Oral Q breakfast  . atorvastatin  40 mg Oral Daily  . carvedilol  25 mg Oral BID WC  . enalapril  10 mg Oral BID  . heparin  5,000 Units Subcutaneous Q8H  . insulin aspart  0-9 Units Subcutaneous Q4H  . lamiVUDine-zidovudine  1 tablet Oral BID  . latanoprost  1 drop Both Eyes Daily  . levofloxacin  750 mg Oral Daily   Continuous Infusions:  PRN Meds:.sodium chloride, albuterol, chlorpheniramine-HYDROcodone, loperamide, nitroGLYCERIN  Assessment/Plan:  Principal Problem:   Shock: resolved. Now with malignant hypertension.   Active Problems:   HIV DISEASE   HYPERLIPIDEMIA   GOUT: resume allopurinol   HYPERTENSION   CORONARY ATHEROSCLEROSIS NATIVE CORONARY ARTERY   Type II or unspecified type diabetes mellitus with unspecified complication, not stated as uncontrolled: metformin held    Ulcer of lower extremity   Community acquired pneumonia   Hyponatremia   Hyperkalemia resolved   Acute kidney injury resolved Probable COPD: no wheeze or dyspnea currently   Elevated transaminase level   Diarrhea: c diff negative.  Prn immodium Chronic systolic HF: compensated.  Echo shows EF 35-40%, which is consistent with most  previous echos.  abx changed to po by ID. Home in am if stable   LOS: 3 days   Jacob [name] 09/09/2013, 2:56 PM

## 2013-09-10 DIAGNOSIS — L97309 Non-pressure chronic ulcer of unspecified ankle with unspecified severity: Secondary | ICD-10-CM

## 2013-09-10 DIAGNOSIS — E1169 Type 2 diabetes mellitus with other specified complication: Secondary | ICD-10-CM

## 2013-09-10 DIAGNOSIS — B20 Human immunodeficiency virus [HIV] disease: Secondary | ICD-10-CM

## 2013-09-10 DIAGNOSIS — M21619 Bunion of unspecified foot: Secondary | ICD-10-CM

## 2013-09-10 LAB — DRUGS OF ABUSE SCREEN W/O ALC, ROUTINE URINE
Amphetamine Screen, Ur: NEGATIVE
Benzodiazepines.: NEGATIVE
Creatinine,U: 48.5 mg/dL
Marijuana Metabolite: NEGATIVE
Methadone: NEGATIVE
Phencyclidine (PCP): NEGATIVE

## 2013-09-10 LAB — CULTURE, RESPIRATORY W GRAM STAIN: Culture: NORMAL

## 2013-09-10 LAB — HIV-1 RNA QUANT-NO REFLEX-BLD
HIV 1 RNA Quant: <20 copies/mL (ref <20)
HIV-1 RNA Quant, Log: <1.3 {Log} (ref <1.30)

## 2013-09-10 LAB — GLUCOSE, CAPILLARY
Glucose-Capillary: 149 mg/dL — ABNORMAL HIGH (ref 70–99)
Glucose-Capillary: 136 mg/dL — ABNORMAL HIGH (ref 70–99)

## 2013-09-10 MED ORDER — CEFUROXIME AXETIL 500 MG PO TABS: 500.0000 mg | ORAL_TABLET | Freq: Two times a day (BID) | ORAL | Status: DC

## 2013-09-10 NOTE — Progress Notes (Signed)
Patient complaining of not being able to get any rest due to his cough. Paged Dr Baltazar Najjar to let her know of patients condition. Dr Baltazar Najjar ordered a one time dose of Tessalon 200 mg PO. Patient received Tessalon. Patient went to sleep and was resting. Will continue to monitor patient throughout the night.

## 2013-09-10 NOTE — Care Management Note (Signed)
    Page 1 of 1   09/10/2013     3:02:53 PM   CARE MANAGEMENT NOTE 09/10/2013  Patient:  Jacob [name], Jacob [name]   Account Number:  [id number]  Date Initiated:  09/10/2013  Documentation initiated by:  Tomi Bamberger  Subjective/Objective Assessment:   dx shock  admit- lives alone.     Action/Plan:   Anticipated DC Date:  09/10/2013   Anticipated DC Plan:  [place]  CM consult      Choice offered to / List presented to:             Status of service:  Completed, signed off Medicare Important Message given?   (If response is "NO", the following Medicare IM given date fields will be blank) Date Medicare IM given:   Date Additional Medicare IM given:    Discharge Disposition:  HOME/SELF CARE  Per UR Regulation:  Reviewed for med. necessity/level of care/duration of stay  If discussed at [place] of Stay Meetings, dates discussed:    Comments:  09/10/13 15:02 Tomi Bamberger RN, BSN [phone] patient lives alone, pta indep.  No needs anticipated.

## 2013-09-10 NOTE — Progress Notes (Signed)
[facility] for Infectious [facility]    Day #2 levaquin 4  Days of vancomycin  3  Days cefepime (had one day rocephin) 4 days azithromycin  Subjective: No new complaints   Antibiotics:  Anti-infectives   Start     Dose/Rate Route Frequency Ordered Stop   09/10/13 0000  cefUROXime (CEFTIN) 500 MG tablet     500 mg Oral 2 times daily with meals 09/10/13 0922     09/09/13 1300  levofloxacin (LEVAQUIN) tablet 750 mg     750 mg Oral Daily 09/09/13 1120     09/08/13 1400  azithromycin (ZITHROMAX) tablet 250 mg  Status:  Discontinued     250 mg Oral Daily 09/08/13 1123 09/09/13 1119   09/07/13 2300  azithromycin (ZITHROMAX) 250 mg in dextrose 5 % 125 mL IVPB  Status:  Discontinued     250 mg 125 mL/hr over 60 Minutes Intravenous Every 24 hours 09/07/13 0000 09/08/13 1123   09/07/13 2300  vancomycin (VANCOCIN) IVPB 1000 mg/200 mL premix  Status:  Discontinued     1,000 mg 200 mL/hr over 60 Minutes Intravenous Every 24 hours 09/07/13 0005 09/07/13 1157   09/07/13 1600  ceFEPIme (MAXIPIME) 1 g in dextrose 5 % 50 mL IVPB  Status:  Discontinued     1 g 100 mL/hr over 30 Minutes Intravenous Every 8 hours 09/07/13 1158 09/09/13 1119   09/07/13 1300  vancomycin (VANCOCIN) IVPB 750 mg/150 ml premix  Status:  Discontinued     750 mg 150 mL/hr over 60 Minutes Intravenous Every 12 hours 09/07/13 1158 09/09/13 1119   09/07/13 0800  atazanavir (REYATAZ) capsule 400 mg     400 mg Oral Daily with breakfast 09/06/13 2301     09/07/13 0800  ceFEPIme (MAXIPIME) 2 g in dextrose 5 % 50 mL IVPB  Status:  Discontinued     2 g 100 mL/hr over 30 Minutes Intravenous Every 24 hours 09/07/13 0005 09/07/13 1158   09/07/13 0200  lamiVUDine-zidovudine (COMBIVIR) 150-300 MG per tablet 1 tablet     1 tablet Oral 2 times daily 09/06/13 2301     09/07/13 0000  vancomycin (VANCOCIN) IVPB 1000 mg/200 mL premix  Status:  Discontinued     1,000 mg 200 mL/hr over 60 Minutes Intravenous Every 24 hours 09/06/13  2353 09/07/13 0005   09/07/13 0000  ceFEPIme (MAXIPIME) 2 g in dextrose 5 % 50 mL IVPB  Status:  Discontinued     2 g 100 mL/hr over 30 Minutes Intravenous Every 12 hours 09/06/13 2353 09/07/13 0005   09/07/13 0000  azithromycin (ZITHROMAX) 250 mg in dextrose 5 % 125 mL IVPB  Status:  Discontinued     250 mg 125 mL/hr over 60 Minutes Intravenous Every 24 hours 09/06/13 2353 09/07/13 0000   09/06/13 2300  vancomycin (VANCOCIN) 1,500 mg in sodium chloride 0.9 % 500 mL IVPB     1,500 mg 250 mL/hr over 120 Minutes Intravenous  Once 09/06/13 2236 09/07/13 0155   09/06/13 2215  cefTRIAXone (ROCEPHIN) 1 g in dextrose 5 % 50 mL IVPB     1 g 100 mL/hr over 30 Minutes Intravenous  Once 09/06/13 2213 09/06/13 2311   09/06/13 2215  azithromycin (ZITHROMAX) 500 mg in dextrose 5 % 250 mL IVPB     500 mg 250 mL/hr over 60 Minutes Intravenous  Once 09/06/13 2213 09/07/13 0039      Medications: Scheduled Meds: . allopurinol  300 mg Oral Daily  . aspirin  EC  81 mg Oral Daily  . atazanavir  400 mg Oral Q breakfast  . carvedilol  25 mg Oral BID WC  . enalapril  10 mg Oral BID  . heparin  5,000 Units Subcutaneous Q8H  . insulin aspart  0-9 Units Subcutaneous Q4H  . lamiVUDine-zidovudine  1 tablet Oral BID  . latanoprost  1 drop Both Eyes Daily  . levofloxacin  750 mg Oral Daily   Continuous Infusions:  PRN Meds:.sodium chloride, albuterol, chlorpheniramine-HYDROcodone, loperamide, nitroGLYCERIN   Objective: Weight change: -0.1 oz (-0.003 kg)  Intake/Output Summary (Last 24 hours) at 09/10/13 1040 Last data filed at 09/10/13 0830  Gross per 24 hour  Intake   1090 ml  Output    700 ml  Net    390 ml   Blood pressure 115/65, pulse 74, temperature 98.4 F (36.9 C), temperature source Oral, resp. rate 18, height 6\' 2"  (1.88 m), weight 178 lb 12.7 oz (81.1 kg), SpO2 98.00%. Temp:  [97.6 F (36.4 C)-98.4 F (36.9 C)] 98.4 F (36.9 C) (11/18 1000) Pulse Rate:  [63-78] 74 (11/18 1022) Resp:   [18] 18 (11/18 1000) BP: (88-169)/(48-89) 115/65 mmHg (11/18 1022) SpO2:  [94 %-98 %] 98 % (11/18 1000) Weight:  [178 lb 12.7 oz (81.1 kg)] 178 lb 12.7 oz (81.1 kg) (11/18 0300)  Physical Exam: General: Alert and awake, oriented x3, not in any acute distress. HEENT: anicteric sclera, pupils reactive to light and accommodation, EOMI CVS regular rate, normal r,  no murmur rubs or gallops Chest: slightly diminished bs at bases but otherwise, clear to auscultation bilaterally, no wheezing, rales or rhonchi Abdomen: soft nontender, nondistended, normal bowel sounds, Neuro: nonfocal  Lab Results:  Recent Labs  09/08/13 0600  WBC 3.6*  HGB 13.7  HCT 38.1*  PLT 153    BMET  Recent Labs  09/07/13 1240 09/08/13 0600  NA 134* 133*  K 4.7 3.9  CL 106 102  CO2 17* 19  GLUCOSE 153* 113*  BUN 31* 20  CREATININE 1.37* 1.18  CALCIUM 7.5* 7.8*    Micro Results: Recent Results (from the past 240 hour(s))  CULTURE, BLOOD (ROUTINE X 2)     Status: None   Collection Time    09/06/13  9:35 PM      Result Value Range Status   Specimen Description BLOOD FOREARM LEFT   Final   Special Requests BOTTLES DRAWN AEROBIC AND ANAEROBIC 10CC   Final   Culture  Setup Time     Final   Value: 09/07/2013 05:00     Performed at [facility]   Culture     Final   Value:        BLOOD CULTURE RECEIVED NO GROWTH TO DATE CULTURE WILL BE HELD FOR 5 DAYS BEFORE ISSUING A FINAL NEGATIVE REPORT     Performed at [facility]   Report Status PENDING   Incomplete  CULTURE, BLOOD (ROUTINE X 2)     Status: None   Collection Time    09/06/13  9:35 PM      Result Value Range Status   Specimen Description BLOOD FOREARM RIGHT   Final   Special Requests BOTTLES DRAWN AEROBIC AND ANAEROBIC 4CC   Final   Culture  Setup Time     Final   Value: 09/07/2013 05:01     Performed at [facility]   Culture     Final   Value:        BLOOD CULTURE  RECEIVED NO GROWTH TO DATE CULTURE WILL BE HELD  FOR 5 DAYS BEFORE ISSUING A FINAL NEGATIVE REPORT     Performed at [facility]   Report Status PENDING   Incomplete  URINE CULTURE     Status: None   Collection Time    09/06/13 10:39 PM      Result Value Range Status   Specimen Description URINE, CATHETERIZED   Final   Special Requests NONE   Final   Culture  Setup Time     Final   Value: 09/07/2013 01:31     Performed at [facility]   Culture     Final   Value: NO GROWTH     Performed at [facility]   Report Status 09/08/2013 FINAL   Final  MRSA PCR SCREENING     Status: None   Collection Time    09/07/13  1:13 AM      Result Value Range Status   MRSA by PCR NEGATIVE  NEGATIVE Final   Comment:            The GeneXpert MRSA Assay (FDA     approved for NASAL specimens     only), is one component of a     comprehensive MRSA colonization     surveillance program. It is not     intended to diagnose MRSA     infection nor to guide or     monitor treatment for     MRSA infections.  CLOSTRIDIUM DIFFICILE BY PCR     Status: None   Collection Time    09/07/13  6:03 PM      Result Value Range Status   C difficile by pcr NEGATIVE  NEGATIVE Final  CULTURE, EXPECTORATED SPUTUM-ASSESSMENT     Status: None   Collection Time    09/07/13  9:28 PM      Result Value Range Status   Specimen Description SPUTUM   Final   Special Requests Immunocompromised   Final   Sputum evaluation     Final   Value: MICROSCOPIC FINDINGS SUGGEST THAT THIS SPECIMEN IS NOT REPRESENTATIVE OF LOWER RESPIRATORY SECRETIONS. PLEASE RECOLLECT.     CALLED TO [facility] RN [id number] [place], S   Report Status 09/07/2013 FINAL   Final  CULTURE, EXPECTORATED SPUTUM-ASSESSMENT     Status: None   Collection Time    09/08/13  9:04 AM      Result Value Range Status   Specimen Description SPUTUM   Final   Special Requests NONE   Final   Sputum evaluation     Final   Value: THIS SPECIMEN IS ACCEPTABLE. RESPIRATORY CULTURE REPORT TO  FOLLOW.   Report Status 09/08/2013 FINAL   Final  CULTURE, RESPIRATORY (NON-EXPECTORATED)     Status: None   Collection Time    09/08/13  9:04 AM      Result Value Range Status   Specimen Description SPUTUM   Final   Special Requests NONE   Final   Gram Stain     Final   Value: MODERATE WBC PRESENT, PREDOMINANTLY PMN     NO SQUAMOUS EPITHELIAL CELLS SEEN     NO ORGANISMS SEEN     Performed at [facility]   Culture     Final   Value: Culture reincubated for better growth     Performed at [facility]   Report Status PENDING   Incomplete    Studies/Results: No results  found.    Assessment/Plan: Jacob [name] is a 62 y.o. male admitted with CAP, sepsis , lower extremity ulcers  #1 CAP: His admission CXR and subsequent is not overwhelming but clinical picture concerning for septic shock  --I am narrowing to levaquin and wound finish 2 more days of antibiotics  #2 Lower extremity ulcers: left ankle is ulcerative, without purulence, Xray was negative for osteo --I would have him followup with wound center and ALSO menton this site to Dr. [name] in followup. If NOT resolving would get MRI but this can be done as an outpt  #3 HIV: has been perfectly suppressed on reyataz, combivir he claims since 1996 and I can only find one viral blip It is yet again so now  I will sign off I am comfortable with him DC to home today     LOS: 4 days   Alcide Evener 09/10/2013, 10:40 AM

## 2013-09-10 NOTE — Progress Notes (Signed)
Nsg Discharge Note  Admit Date:  09/06/2013 Discharge date: 09/10/2013   [name] to be D/C'd Home per MD order.  AVS completed.  Copy for chart, and copy for patient signed, and dated. Patient/caregiver able to verbalize understanding.  Discharge Medication:   Medication List    STOP taking these medications       atorvastatin 40 MG tablet  Commonly known as:  LIPITOR     naproxen 250 MG tablet  Commonly known as:  NAPROSYN      TAKE these medications       albuterol 108 (90 BASE) MCG/ACT inhaler  Commonly known as:  PROVENTIL HFA;VENTOLIN HFA  Inhale 1-2 puffs into the lungs every 6 (six) hours as needed for wheezing or shortness of breath.     allopurinol 300 MG tablet  Commonly known as:  ZYLOPRIM  Take 1 tablet (300 mg total) by mouth daily.     aspirin 81 MG tablet  Take 81 mg by mouth daily.     atazanavir 200 MG capsule  Commonly known as:  REYATAZ  Take 2 capsules (400 mg total) by mouth daily with breakfast.     azithromycin 250 MG tablet  Commonly known as:  ZITHROMAX  Take 1 tablet (250 mg total) by mouth daily. Take first 2 tablets together, then 1 every day until finished.     carvedilol 25 MG tablet  Commonly known as:  COREG  Take 1 tablet (25 mg total) by mouth 2 (two) times daily with a meal.     cefUROXime 500 MG tablet  Commonly known as:  CEFTIN  Take 1 tablet (500 mg total) by mouth 2 (two) times daily with a meal.     chlorpheniramine-HYDROcodone 10-8 MG/5ML Lqcr  Commonly known as:  TUSSIONEX PENNKINETIC ER  Take 5 mLs by mouth every 12 (twelve) hours as needed for cough.     colchicine 0.6 MG tablet  Take 0.6 mg by mouth continuous as needed. For gout.     collagenase ointment  Commonly known as:  SANTYL  Apply 1 application topically daily. Apply to wound on ankle     enalapril 10 MG tablet  Commonly known as:  VASOTEC  Take 1 tablet by mouth 2 (two) times daily.     GLUCOSAMINE CHONDR 1500 COMPLX PO  Take 2 tablets by  mouth daily.     lamiVUDine-zidovudine 150-300 MG per tablet  Commonly known as:  COMBIVIR  Take 1 tablet by mouth 2 (two) times daily.     latanoprost 0.005 % ophthalmic solution  Commonly known as:  XALATAN  Place 1 drop into both eyes daily.     metFORMIN 500 MG tablet  Commonly known as:  GLUCOPHAGE  Take 500 mg by mouth 2 (two) times daily with a meal.     naftifine 1 % cream  Commonly known as:  NAFTIN  Apply 1 application topically daily as needed. Every time pt washes his feet he applies cream.     nitroGLYCERIN 0.3 MG SL tablet  Commonly known as:  NITROSTAT  Place 1 tablet (0.3 mg total) under the tongue every 5 (five) minutes as needed.        Discharge Assessment: Filed Vitals:   09/10/13 1022  BP: 115/65  Pulse: 74  Temp:   Resp:    Skin clean, dry and intact without evidence of skin break down, no evidence of skin tears noted.L Lower leg dsg. Intact. Pt. Attends [facility] for wound treatment.  IV catheter discontinued intact. Site without signs and symptoms of complications - no redness or edema noted at insertion site, patient denies c/o pain - only slight tenderness at site.  Dressing with slight pressure applied.  D/c Instructions-Education: Discharge instructions given to patient/family with verbalized understanding. D/c education completed with patient/family including follow up instructions, medication list, d/c activities limitations if indicated, with other d/c instructions as indicated by MD - patient able to verbalize understanding, all questions fully answered. Patient instructed to return to ED, call 911, or call MD for any changes in condition.  Patient escorted via [place], and D/C home via private auto.  Dayle Points, RN 09/10/2013 11:54 AM

## 2013-09-10 NOTE — Discharge Summary (Signed)
Physician Discharge Summary  Jacob [name] [id number] DOB: 11/24/1950 DOA: 09/06/2013  PCP: Velna Hatchet, MD  Admit date: 09/06/2013 Discharge date: 09/10/2013  Time spent: greater than 30 minutes  Recommendations for Outpatient Follow-up:  Recheck LFTs. Resume statin if normal  Discharge Diagnoses:  Principal Problem:   Shock Active Problems:   HIV DISEASE   HYPERLIPIDEMIA   GOUT   HYPERTENSION   CORONARY ATHEROSCLEROSIS NATIVE CORONARY ARTERY   Chronic combined systolic and diastolic congestive heart failure   Type II or unspecified type diabetes mellitus with unspecified complication, not stated as uncontrolled   Ulcer of lower extremity   Community acquired pneumonia   Hyponatremia   Hyperkalemia   Acute kidney injury   Hyperglycemia   Elevated transaminase level   Diarrhea   Discharge Condition: stable  Filed Weights   09/08/13 0629 09/09/13 0540 09/10/13 0300  Weight: 83.28 kg (183 lb 9.6 oz) 81.103 kg (178 lb 12.8 oz) 81.1 kg (178 lb 12.7 oz)    History of present illness:  62 yo AA male with HIV admitted to critical care service with septic shock secondary to pneumonia.  Stabilized with IVF, antibiotics, steroids. Had mild hypoxic respiratory failure felt secondary to pneumonia and probable COPD.  Transferred to hospitalists. Had diarrhea. C diff negative.  Elevated LFTS likely secondary to shock liver, but statin has been held and will need to be resumed as outpatient if LFTs normalize.  Acute kidney injury secondary to shock/infection. Improved to near baseline by discharge.  By discharge, feeling much better, vital signs normal, tolerating diet, ambulating  Procedures:  none  Consultations:  ID  Discharge Exam: Filed Vitals:   09/10/13 0753  BP: 169/89  Pulse: 71  Temp: 97.6 F (36.4 C)  Resp: 18    General: comfortable, nontoxic Cardiovascular: RRR Respiratory: CTA Abd: s, nt, nd  Discharge Instructions  Discharge Orders   Future  Appointments Provider Department Dept Phone   09/17/2013 2:15 PM [place], [place] [facility] [place]   11/12/2013 10:00 AM [facility] [phone]   11/26/2013 10:00 AM [name], MD [facility] for [facility] Disease [phone]   Future Orders Complete By Expires   Diet - low sodium heart healthy  As directed    Diet Carb Modified  As directed    Discharge instructions  As directed    Comments:     Hold lipitor until liver function tests repeated in the office.   Increase activity slowly  As directed        Medication List    STOP taking these medications       atorvastatin 40 MG tablet  Commonly known as:  LIPITOR     naproxen 250 MG tablet  Commonly known as:  NAPROSYN      TAKE these medications       albuterol 108 (90 BASE) MCG/ACT inhaler  Commonly known as:  PROVENTIL HFA;VENTOLIN HFA  Inhale 1-2 puffs into the lungs every 6 (six) hours as needed for wheezing or shortness of breath.     allopurinol 300 MG tablet  Commonly known as:  ZYLOPRIM  Take 1 tablet (300 mg total) by mouth daily.     aspirin 81 MG tablet  Take 81 mg by mouth daily.     atazanavir 200 MG capsule  Commonly known as:  REYATAZ  Take 2 capsules (400 mg total) by mouth daily with breakfast.  azithromycin 250 MG tablet  Commonly known as:  ZITHROMAX  Take 1 tablet (250 mg total) by mouth daily. Take first 2 tablets together, then 1 every day until finished.     carvedilol 25 MG tablet  Commonly known as:  COREG  Take 1 tablet (25 mg total) by mouth 2 (two) times daily with a meal.     cefUROXime 500 MG tablet  Commonly known as:  CEFTIN  Take 1 tablet (500 mg total) by mouth 2 (two) times daily with a meal.     chlorpheniramine-HYDROcodone 10-8 MG/5ML Lqcr  Commonly known as:  TUSSIONEX PENNKINETIC ER  Take 5 mLs by mouth every 12 (twelve) hours as needed for cough.     colchicine 0.6 MG  tablet  Take 0.6 mg by mouth continuous as needed. For gout.     collagenase ointment  Commonly known as:  SANTYL  Apply 1 application topically daily. Apply to wound on ankle     enalapril 10 MG tablet  Commonly known as:  VASOTEC  Take 1 tablet by mouth 2 (two) times daily.     GLUCOSAMINE CHONDR 1500 COMPLX PO  Take 2 tablets by mouth daily.     lamiVUDine-zidovudine 150-300 MG per tablet  Commonly known as:  COMBIVIR  Take 1 tablet by mouth 2 (two) times daily.     latanoprost 0.005 % ophthalmic solution  Commonly known as:  XALATAN  Place 1 drop into both eyes daily.     metFORMIN 500 MG tablet  Commonly known as:  GLUCOPHAGE  Take 500 mg by mouth 2 (two) times daily with a meal.     naftifine 1 % cream  Commonly known as:  NAFTIN  Apply 1 application topically daily as needed. Every time pt washes his feet he applies cream.     nitroGLYCERIN 0.3 MG SL tablet  Commonly known as:  NITROSTAT  Place 1 tablet (0.3 mg total) under the tongue every 5 (five) minutes as needed.       Allergies  Allergen Reactions  . Amphetamines Other (See Comments)    unknown       Follow-up Information   Follow up with HOLWERDA, SCOTT, MD In 2 weeks. (to check blood pressure and liver function tests)    Specialty:  Internal Medicine   Contact information:   [address] [place] [address] [phone]        The results of significant diagnostics from this hospitalization (including imaging, microbiology, ancillary and laboratory) are listed below for reference.    Significant Diagnostic Studies: Dg Chest 2 View  09/06/2013   CLINICAL DATA:  Persistent cough for 3 weeks.  EXAM: CHEST  2 VIEW  COMPARISON:  Chest radiograph performed 08/23/2013  FINDINGS: The lungs are well-aerated. There is suspicion of left basilar airspace opacity, given the appearance of the lateral view. Chronic peribronchial thickening is noted. There is no evidence of pleural effusion or  pneumothorax.  The heart is borderline normal in size. The patient is status post median sternotomy, with evidence of prior CABG. No acute osseous abnormalities are seen. Chronic right-sided rib deformities are seen. Clips are noted within the right upper quadrant, reflecting prior cholecystectomy.  IMPRESSION: Suspect left basilar airspace opacity, raising concern for mild pneumonia.   Electronically Signed   By: Garald Balding M.D.   On: 09/06/2013 07:00   Dg Chest 2 View  08/23/2013   CLINICAL DATA:  Cough and congestion. Fever and headaches.  EXAM: CHEST  2  VIEW  COMPARISON:  Chest and rib films 12/07/2012.  FINDINGS: Significant callus formation is present over the remote rib fractures, progressed since the prior study. The heart size is normal. Mild chronic interstitial changes are evident. Emphysematous changes are again noted. No focal airspace disease is present.  IMPRESSION: 1. Stable emphysema and chronic interstitial coarsening. 2. Progressive callus formation associated with the right-sided rib fractures. 3. No acute cardiopulmonary disease.   Electronically Signed   By: Lawrence Santiago M.D.   On: 08/23/2013 11:46   Dg Tibia/fibula Left  09/07/2013   CLINICAL DATA:  Ulceration along the distal left lateral lower leg and ankle. Assess for osteomyelitis.  EXAM: LEFT TIBIA AND FIBULA - 2 VIEW  COMPARISON:  None.  FINDINGS: The patient's lower leg soft tissue ulceration is partially characterized. No underlying osseous erosion is seen to suggest osteomyelitis. The tibia and fibula appear intact. The ankle mortise is incompletely assessed, but appears grossly unremarkable.  The knee joint is grossly unremarkable in appearance. Scattered vascular calcifications are seen.  IMPRESSION: 1. No osseous erosion seen to suggest osteomyelitis. The tibia and fibula remain intact. 2. Scattered vascular calcifications seen.   Electronically Signed   By: Garald Balding M.D.   On: 09/07/2013 01:17   Dg Chest Port  1 View  09/08/2013   CLINICAL DATA:  Followup of pneumonia.  EXAM: PORTABLE CHEST - 1 VIEW  COMPARISON:  09/06/2013  FINDINGS: Prior median sternotomy. Extensive remote right rib fractures. Midline trachea. Mild cardiomegaly with atherosclerosis in the transverse aorta. No pleural effusion or pneumothorax. Clear lungs.  IMPRESSION: No evidence of pneumonia.  Cardiomegaly without congestive failure.   Electronically Signed   By: Abigail Miyamoto M.D.   On: 09/08/2013 08:12   Dg Chest Portable 1 View  09/06/2013   CLINICAL DATA:  Weakness, near syncope.  EXAM: PORTABLE CHEST - 1 VIEW  COMPARISON:  09/06/2013.  FINDINGS: Trachea is midline. Heart is enlarged and accentuated by AP supine technique. Probable vascular crowding with low lung volumes. No airspace consolidation or pleural fluid. Multiple old right rib fractures.  IMPRESSION: No acute findings.   Electronically Signed   By: Lorin Picket M.D.   On: 09/06/2013 21:49    Microbiology: Recent Results (from the past 240 hour(s))  CULTURE, BLOOD (ROUTINE X 2)     Status: None   Collection Time    09/06/13  9:35 PM      Result Value Range Status   Specimen Description BLOOD FOREARM LEFT   Final   Special Requests BOTTLES DRAWN AEROBIC AND ANAEROBIC 10CC   Final   Culture  Setup Time     Final   Value: 09/07/2013 05:00     Performed at [facility]   Culture     Final   Value:        BLOOD CULTURE RECEIVED NO GROWTH TO DATE CULTURE WILL BE HELD FOR 5 DAYS BEFORE ISSUING A FINAL NEGATIVE REPORT     Performed at [facility]   Report Status PENDING   Incomplete  CULTURE, BLOOD (ROUTINE X 2)     Status: None   Collection Time    09/06/13  9:35 PM      Result Value Range Status   Specimen Description BLOOD FOREARM RIGHT   Final   Special Requests BOTTLES DRAWN AEROBIC AND ANAEROBIC 4CC   Final   Culture  Setup Time     Final   Value: 09/07/2013 05:01     Performed  at [facility]     Final   Value:         BLOOD CULTURE RECEIVED NO GROWTH TO DATE CULTURE WILL BE HELD FOR 5 DAYS BEFORE ISSUING A FINAL NEGATIVE REPORT     Performed at [facility]   Report Status PENDING   Incomplete  URINE CULTURE     Status: None   Collection Time    09/06/13 10:39 PM      Result Value Range Status   Specimen Description URINE, CATHETERIZED   Final   Special Requests NONE   Final   Culture  Setup Time     Final   Value: 09/07/2013 01:31     Performed at [facility]   Culture     Final   Value: NO GROWTH     Performed at [facility]   Report Status 09/08/2013 FINAL   Final  MRSA PCR SCREENING     Status: None   Collection Time    09/07/13  1:13 AM      Result Value Range Status   MRSA by PCR NEGATIVE  NEGATIVE Final   Comment:            The GeneXpert MRSA Assay (FDA     approved for NASAL specimens     only), is one component of a     comprehensive MRSA colonization     surveillance program. It is not     intended to diagnose MRSA     infection nor to guide or     monitor treatment for     MRSA infections.  CLOSTRIDIUM DIFFICILE BY PCR     Status: None   Collection Time    09/07/13  6:03 PM      Result Value Range Status   C difficile by pcr NEGATIVE  NEGATIVE Final  CULTURE, EXPECTORATED SPUTUM-ASSESSMENT     Status: None   Collection Time    09/07/13  9:28 PM      Result Value Range Status   Specimen Description SPUTUM   Final   Special Requests Immunocompromised   Final   Sputum evaluation     Final   Value: MICROSCOPIC FINDINGS SUGGEST THAT THIS SPECIMEN IS NOT REPRESENTATIVE OF LOWER RESPIRATORY SECRETIONS. PLEASE RECOLLECT.     CALLED TO [facility] RN [id number] [place], S   Report Status 09/07/2013 FINAL   Final  CULTURE, EXPECTORATED SPUTUM-ASSESSMENT     Status: None   Collection Time    09/08/13  9:04 AM      Result Value Range Status   Specimen Description SPUTUM   Final   Special Requests NONE   Final   Sputum evaluation     Final    Value: THIS SPECIMEN IS ACCEPTABLE. RESPIRATORY CULTURE REPORT TO FOLLOW.   Report Status 09/08/2013 FINAL   Final  CULTURE, RESPIRATORY (NON-EXPECTORATED)     Status: None   Collection Time    09/08/13  9:04 AM      Result Value Range Status   Specimen Description SPUTUM   Final   Special Requests NONE   Final   Gram Stain     Final   Value: MODERATE WBC PRESENT, PREDOMINANTLY PMN     NO SQUAMOUS EPITHELIAL CELLS SEEN     NO ORGANISMS SEEN     Performed at [facility]   Culture     Final   Value: Culture reincubated for better  growth     Performed at [facility]   Report Status PENDING   Incomplete     Labs: Basic Metabolic Panel:  Recent Labs Lab 09/06/13 2125 09/07/13 09/07/13 0345 09/07/13 1240 09/08/13 0600  NA 132*  --  135 134* 133*  K 5.8*  --  5.0 4.7 3.9  CL 101  --  107 106 102  CO2 17*  --  14* 17* 19  GLUCOSE 271*  --  153* 153* 113*  BUN 38*  --  34* 31* 20  CREATININE 1.94* 1.59* 1.46* 1.37* 1.18  CALCIUM 7.3*  --  7.1* 7.5* 7.8*  MG  --   --   --   --  1.4*  PHOS  --   --   --   --  2.1*   Liver Function Tests:  Recent Labs Lab 09/06/13 2125 09/07/13 0345 09/07/13 1240  AST 306* 306* 369*  ALT 187* 189* 222*  ALKPHOS 88 91 95  BILITOT 0.5 0.3 1.0  PROT 6.4 6.6 7.2  ALBUMIN 2.3* 2.3* 2.6*   No results found for this basename: LIPASE, AMYLASE,  in the last 168 hours No results found for this basename: AMMONIA,  in the last 168 hours CBC:  Recent Labs Lab 09/06/13 2125 09/07/13 09/08/13 0600  WBC 4.1 4.8 3.6*  NEUTROABS 1.6*  --   --   HGB 14.2 13.0 13.7  HCT 39.2 36.2* 38.1*  MCV 107.1* 107.1* 107.9*  PLT 164 163 153   Cardiac Enzymes:  Recent Labs Lab 09/07/13 09/07/13 0345 09/07/13 1240  TROPONINI <0.30 <0.30 <0.30   BNP: BNP (last 3 results) No results found for this basename: PROBNP,  in the last 8760 hours CBG:  Recent Labs Lab 09/09/13 1621 09/09/13 2033 09/10/13 0029 09/10/13 0354  09/10/13 0752  GLUCAP 291* 219* 154* 149* 136*       Signed:  [place] L  [facility] 09/10/2013, 9:23 AM

## 2013-09-11 LAB — OPIATE, QUANTITATIVE, URINE
Hydrocodone: NEGATIVE ng/mL
Hydromorphone GC/MS Conf: NEGATIVE ng/mL
Morphine, Confirm: 605 ng/mL
Noroxycodone, Ur: NEGATIVE ng/mL

## 2013-09-13 LAB — CULTURE, BLOOD (ROUTINE X 2): Culture: NO GROWTH

## 2013-09-17 ENCOUNTER — Ambulatory Visit (INDEPENDENT_AMBULATORY_CARE_PROVIDER_SITE_OTHER): Payer: Medicare Other | Admitting: Podiatry

## 2013-09-17 ENCOUNTER — Encounter: Payer: Self-pay | Admitting: Podiatry

## 2013-09-17 VITALS — BP 135/69 | HR 85 | Resp 16 | Ht 74.0 in | Wt 180.0 lb

## 2013-09-17 DIAGNOSIS — B351 Tinea unguium: Secondary | ICD-10-CM

## 2013-09-17 DIAGNOSIS — M79609 Pain in unspecified limb: Secondary | ICD-10-CM

## 2013-09-17 NOTE — Progress Notes (Signed)
Jacob [name] presents today states it is been in the hospital for pneumonia for the past week or so. His complaining of painful toenails one through 5 bilateral. She's also complaining of painful calluses bilateral foot.  Objective: Pulses remain palpable bilateral. Nails are thick yellow dystrophic clinically mycotic. Reactive hyperkeratosis are present to the plantar aspect of the bilateral foot.  Assessment: Pain in limb secondary to on onychomycosis and calluses.  Plan: Debridement of nails 1 through 5 bilateral is cover service secondary to pain debridement of reactive hyperkeratosis bilaterally

## 2013-09-17 NOTE — Patient Instructions (Signed)
Diabetes and Foot Care Diabetes may cause you to have problems because of poor blood supply (circulation) to your feet and legs. This may cause the skin on your feet to become thinner, break easier, and heal more slowly. Your skin may become dry, and the skin may peel and crack. You may also have nerve damage in your legs and feet causing decreased feeling in them. You may not notice minor injuries to your feet that could lead to infections or more serious problems. Taking care of your feet is one of the most important things you can do for yourself.  HOME CARE INSTRUCTIONS  Wear shoes at all times, even in the house. Do not go barefoot. Bare feet are easily injured.  Check your feet daily for blisters, cuts, and redness. If you cannot see the bottom of your feet, use a mirror or ask someone for help.  Wash your feet with warm water (do not use hot water) and mild soap. Then pat your feet and the areas between your toes until they are completely dry. Do not soak your feet as this can dry your skin.  Apply a moisturizing lotion or petroleum jelly (that does not contain alcohol and is unscented) to the skin on your feet and to dry, brittle toenails. Do not apply lotion between your toes.  Trim your toenails straight across. Do not dig under them or around the cuticle. File the edges of your nails with an emery board or nail file.  Do not cut corns or calluses or try to remove them with medicine.  Wear clean socks or stockings every day. Make sure they are not too tight. Do not wear knee-high stockings since they may decrease blood flow to your legs.  Wear shoes that fit properly and have enough cushioning. To break in new shoes, wear them for just a few hours a day. This prevents you from injuring your feet. Always look in your shoes before you put them on to be sure there are no objects inside.  Do not cross your legs. This may decrease the blood flow to your feet.  If you find a minor scrape,  cut, or break in the skin on your feet, keep it and the skin around it clean and dry. These areas may be cleansed with mild soap and water. Do not cleanse the area with peroxide, alcohol, or iodine.  When you remove an adhesive bandage, be sure not to damage the skin around it.  If you have a wound, look at it several times a day to make sure it is healing.  Do not use heating pads or hot water bottles. They may burn your skin. If you have lost feeling in your feet or legs, you may not know it is happening until it is too late.  Make sure your health care provider performs a complete foot exam at least annually or more often if you have foot problems. Report any cuts, sores, or bruises to your health care provider immediately. SEEK MEDICAL CARE IF:   You have an injury that is not healing.  You have cuts or breaks in the skin.  You have an ingrown nail.  You notice redness on your legs or feet.  You feel burning or tingling in your legs or feet.  You have pain or cramps in your legs and feet.  Your legs or feet are numb.  Your feet always feel cold. SEEK IMMEDIATE MEDICAL CARE IF:   There is increasing redness,   swelling, or pain in or around a wound.  There is a red line that goes up your leg.  Pus is coming from a wound.  You develop a fever or as directed by your health care provider.  You notice a bad smell coming from an ulcer or wound. Document Released: 10/07/2000 Document Revised: 06/12/2013 Document Reviewed: 03/19/2013 Exit[employer] Patient Information 2014 [employer].  

## 2013-09-19 LAB — HLA B*5701

## 2013-09-24 ENCOUNTER — Encounter (HOSPITAL_BASED_OUTPATIENT_CLINIC_OR_DEPARTMENT_OTHER): Payer: PRIVATE HEALTH INSURANCE | Attending: General Surgery

## 2013-09-24 DIAGNOSIS — E1169 Type 2 diabetes mellitus with other specified complication: Secondary | ICD-10-CM | POA: Insufficient documentation

## 2013-09-24 DIAGNOSIS — L97809 Non-pressure chronic ulcer of other part of unspecified lower leg with unspecified severity: Secondary | ICD-10-CM | POA: Insufficient documentation

## 2013-09-26 ENCOUNTER — Telehealth: Payer: Self-pay | Admitting: *Deleted

## 2013-09-26 NOTE — Telephone Encounter (Signed)
Pt states that his RN from [facility] told him to "start Ensure."  "Pt had lost weight from recent hospitalization and needs to gain back the weight."  Pt's last recorded weight was 180 lb, BMI = 23.1.  This RN told the pt that Dr. [name] would need to decide whether the pt should start Ensure.  Pt requested to be notified about the answer.

## 2013-09-26 NOTE — Telephone Encounter (Signed)
Please ask Jacob [name] to get that approval from his PCP.

## 2013-10-28 ENCOUNTER — Other Ambulatory Visit: Payer: Self-pay | Admitting: Internal Medicine

## 2013-10-28 DIAGNOSIS — B20 Human immunodeficiency virus [HIV] disease: Secondary | ICD-10-CM

## 2013-10-29 ENCOUNTER — Encounter (HOSPITAL_BASED_OUTPATIENT_CLINIC_OR_DEPARTMENT_OTHER): Payer: PRIVATE HEALTH INSURANCE | Attending: General Surgery

## 2013-10-29 DIAGNOSIS — L97809 Non-pressure chronic ulcer of other part of unspecified lower leg with unspecified severity: Secondary | ICD-10-CM | POA: Insufficient documentation

## 2013-10-29 DIAGNOSIS — E1169 Type 2 diabetes mellitus with other specified complication: Secondary | ICD-10-CM | POA: Insufficient documentation

## 2013-10-30 ENCOUNTER — Telehealth: Payer: Self-pay | Admitting: Cardiovascular Disease

## 2013-10-30 NOTE — Telephone Encounter (Signed)
New message  Patient called in to confirm appt and then proceeded to tell me that he was in pain, He them begin moaning in the phone with pain. He was very SOB when speaking with him. I sent call to [facility] in Triage.

## 2013-10-30 NOTE — Telephone Encounter (Signed)
Follow up ° ° ° ° ° ° ° ° ° °Pt returning nurses call °

## 2013-10-30 NOTE — Telephone Encounter (Signed)
See previous note 10/30/13.

## 2013-10-30 NOTE — Telephone Encounter (Signed)
Received call from patient he stated he has appointment with Dr.Nishan this Friday 11/01/13.Stated he has been drinking a lot over the holidays.Stated he wanted Dr.Nishan to admit him to the hospital to check out his heart.Patient stated he had to hang up he was getting a call from his landlord.Patient stated call me back in a few minutes .  Returned call to patient he stated he wanted his heart checked out.Stated he has been smoking and drinking too much.No chest pain at present.Advised to keep appointment with Dr.Nishan 11/01/13 at 10:15 am.

## 2013-11-01 ENCOUNTER — Telehealth: Payer: Self-pay | Admitting: *Deleted

## 2013-11-01 ENCOUNTER — Ambulatory Visit (INDEPENDENT_AMBULATORY_CARE_PROVIDER_SITE_OTHER): Payer: PRIVATE HEALTH INSURANCE | Admitting: Cardiovascular Disease

## 2013-11-01 ENCOUNTER — Encounter: Payer: Self-pay | Admitting: Cardiovascular Disease

## 2013-11-01 VITALS — BP 152/90 | HR 80 | Ht 74.0 in | Wt 180.8 lb

## 2013-11-01 DIAGNOSIS — I5042 Chronic combined systolic (congestive) and diastolic (congestive) heart failure: Secondary | ICD-10-CM

## 2013-11-01 DIAGNOSIS — I10 Essential (primary) hypertension: Secondary | ICD-10-CM

## 2013-11-01 DIAGNOSIS — I255 Ischemic cardiomyopathy: Secondary | ICD-10-CM

## 2013-11-01 DIAGNOSIS — I509 Heart failure, unspecified: Secondary | ICD-10-CM

## 2013-11-01 DIAGNOSIS — I2510 Atherosclerotic heart disease of native coronary artery without angina pectoris: Secondary | ICD-10-CM

## 2013-11-01 DIAGNOSIS — R9439 Abnormal result of other cardiovascular function study: Secondary | ICD-10-CM

## 2013-11-01 DIAGNOSIS — R931 Abnormal findings on diagnostic imaging of heart and coronary circulation: Secondary | ICD-10-CM

## 2013-11-01 DIAGNOSIS — L97909 Non-pressure chronic ulcer of unspecified part of unspecified lower leg with unspecified severity: Secondary | ICD-10-CM

## 2013-11-01 DIAGNOSIS — I2589 Other forms of chronic ischemic heart disease: Secondary | ICD-10-CM

## 2013-11-01 NOTE — Telephone Encounter (Signed)
OFFICE   NOTE  FROM TODAY  FAXED  TO   [place]  PER  PT'S  [place]   [phone]./CY

## 2013-11-01 NOTE — Assessment & Plan Note (Signed)
Stable with no angina and good activity level.  Continue medical Rx Will get CABG records

## 2013-11-01 NOTE — Progress Notes (Signed)
[name], male   DOB: 1950/11/24, 63 y.o.   MRN: [id number]  63 yo patient previously seen by Dr Verl Blalock.  History of ischemic DCM  CABG 2004  Recent pneumonia  Echo 2014   Study Conclusions  - Left ventricle: The cavity size was moderately dilated. Wall thickness was increased in a pattern of mild LVH. Systolic function was moderately reduced. The estimated ejection fraction was in the range of 35% to 40%. There is severe hypokinesis of the entireinferior myocardium. Doppler parameters are consistent with abnormal left ventricular relaxation (grade 1 diastolic dysfunction). - Left atrium: The atrium was mildly dilated. - Right atrium: The atrium was mildly dilated.  Dr Yolanda Bonine notes very general with no specific details about CABG or bypass grafts  [facility]  History of drug use  Denies at this time  Has wound on LLE and seeing wound center.  ? From venous insuf.       ROS: Denies fever, malais, weight loss, blurry vision, decreased visual acuity, cough, sputum, SOB, hemoptysis, pleuritic pain, palpitaitons, heartburn, abdominal pain, melena, lower extremity edema, claudication, or rash.  All other systems reviewed and negative   General: Affect appropriate Thin black male  HEENT: normal Neck supple with no adenopathy JVP normal no bruits no thyromegaly Lungs clear with no wheezing and good diaphragmatic motion Heart:  S1/S2 no murmur,rub, gallop or click PMI normal Abdomen: benighn, BS positve, no tenderness, no AAA no bruit.  No HSM or HJR Distal pulses intact with no bruits No edema Neuro non-focal Skin warm and dry ulcer wrapped in LLE with bilateral varicose veins  No muscular weakness  Medications Current Outpatient Prescriptions  Medication Sig Dispense Refill  . albuterol (PROVENTIL HFA;VENTOLIN HFA) 108 (90 BASE) MCG/ACT inhaler Inhale 1-2 puffs into the lungs every 6 (six) hours as needed for wheezing or shortness of breath.   1 Inhaler  0  . allopurinol (ZYLOPRIM) 300 MG tablet Take 1 tablet (300 mg total) by mouth daily.  30 tablet  6  . aspirin 81 MG tablet Take 81 mg by mouth daily.        [name] azithromycin (ZITHROMAX) 250 MG tablet Take 1 tablet (250 mg total) by mouth daily. Take first 2 tablets together, then 1 every day until finished.  6 tablet  0  . carvedilol (COREG) 25 MG tablet Take 1 tablet (25 mg total) by mouth 2 (two) times daily with a meal.  180 tablet  1  . cefUROXime (CEFTIN) 500 MG tablet Take 1 tablet (500 mg total) by mouth 2 (two) times daily with a meal.  6 tablet  0  . chlorpheniramine-HYDROcodone (TUSSIONEX PENNKINETIC ER) 10-8 MG/5ML LQCR Take 5 mLs by mouth every 12 (twelve) hours as needed for cough.  115 mL  0  . colchicine 0.6 MG tablet Take 0.6 mg by mouth continuous as needed. For gout.      . collagenase (SANTYL) ointment Apply 1 application topically daily. Apply to wound on ankle      . enalapril (VASOTEC) 10 MG tablet Take 1 tablet by mouth 2 (two) times daily.      . Glucosamine-Chondroit-Vit C-Mn (GLUCOSAMINE CHONDR 1500 COMPLX PO) Take 2 tablets by mouth daily.       [name] lamiVUDine-zidovudine (COMBIVIR) 150-300 MG per tablet TAKE 1 TABLET BY MOUTH    TWICE A DAY (EVERY 12     HOURS)  60 tablet  11  . latanoprost (XALATAN) 0.005 %  ophthalmic solution Place 1 drop into both eyes daily.      . metFORMIN (GLUCOPHAGE) 500 MG tablet Take 500 mg by mouth 2 (two) times daily with a meal.      . naftifine (NAFTIN) 1 % cream Apply 1 application topically daily as needed. Every time pt washes his feet he applies cream.      . nitroGLYCERIN (NITROSTAT) 0.3 MG SL tablet Place 1 tablet (0.3 mg total) under the tongue every 5 (five) minutes as needed.  25 tablet  3  . REYATAZ 200 MG capsule TAKE 2 CAPSULES (400MG )   BY MOUTH ONCE EVERY DAY   IN THE MORNING WITH       BREAKFAST  60 capsule  11  . [DISCONTINUED] calcium carbonate (OS-CAL) 600 MG TABS Take 600 mg by mouth 2 (two) times daily with a  meal.         No current facility-administered medications for this visit.    Allergies Amphetamines  Family History: Family History  Problem Relation Age of Onset  . Heart failure Father   . Heart attack Brother   . Drug abuse Brother   . Colon cancer Neg Hx     Social History: History   Social History  . Marital Status: Single    Spouse Name: N/A    Number of Children: N/A  . Years of Education: N/A   Occupational History  . Not on file.   Social History Main Topics  . Smoking status: Current Every Day Smoker -- 0.50 packs/day for 36 years    Types: Cigarettes  . Smokeless tobacco: Never Used  . Alcohol Use: 0.0 oz/week     Comment: 2 - 24 oz beers / week  . Drug Use: No     Comment: none since 2008 (cocaine use)  . Sexual Activity: Not Currently     Comment: declined condoms   Other Topics Concern  . Not on file   Social History Narrative   Retired.     Electrocardiogram:  Assessment and Plan

## 2013-11-01 NOTE — Assessment & Plan Note (Signed)
Reviewed echo and images poor with questionable estimate of EF Needs cardiac MRI to risk statify for AICD

## 2013-11-01 NOTE — Patient Instructions (Signed)
Your physician wants you to follow-up in:   [place] will receive a reminder letter in the mail two months in advance. If you don't receive a letter, please call our office to schedule the follow-up appointment. Your physician recommends that you continue on your current medications as directed. Please refer to the Current Medication list given to you today. Your physician has requested that you have a cardiac MRI. Cardiac MRI uses a computer to create images of your heart as its beating, producing both still and moving pictures of your heart and major blood vessels. For further information please visit [email or web address] Please follow the instruction sheet given to you today for more information.

## 2013-11-01 NOTE — Assessment & Plan Note (Signed)
Has not been compliant with ACE discussed at length continue attempts at medical Rx

## 2013-11-01 NOTE — Assessment & Plan Note (Signed)
Improving over last month No history of PVD  ? Venous  F/U wound center

## 2013-11-12 ENCOUNTER — Other Ambulatory Visit: Payer: PRIVATE HEALTH INSURANCE

## 2013-11-12 DIAGNOSIS — Z113 Encounter for screening for infections with a predominantly sexual mode of transmission: Secondary | ICD-10-CM

## 2013-11-12 DIAGNOSIS — B20 Human immunodeficiency virus [HIV] disease: Secondary | ICD-10-CM

## 2013-11-12 DIAGNOSIS — Z79899 Other long term (current) drug therapy: Secondary | ICD-10-CM

## 2013-11-12 LAB — COMPREHENSIVE METABOLIC PANEL
ALK PHOS: 65 U/L (ref 39–117)
ALT: 29 U/L (ref 0–53)
AST: 25 U/L (ref 0–37)
Albumin: 3.9 g/dL (ref 3.5–5.2)
BUN: 22 mg/dL (ref 6–23)
CO2: 30 mEq/L (ref 19–32)
CREATININE: 1.24 mg/dL (ref 0.50–1.35)
Calcium: 9.6 mg/dL (ref 8.4–10.5)
Chloride: 102 mEq/L (ref 96–112)
Glucose, Bld: 150 mg/dL — ABNORMAL HIGH (ref 70–99)
Potassium: 4.7 mEq/L (ref 3.5–5.3)
SODIUM: 140 meq/L (ref 135–145)
TOTAL PROTEIN: 7.4 g/dL (ref 6.0–8.3)
Total Bilirubin: 1.2 mg/dL (ref 0.3–1.2)

## 2013-11-12 LAB — LIPID PANEL
CHOLESTEROL: 102 mg/dL (ref 0–200)
HDL: 37 mg/dL — ABNORMAL LOW (ref >39)
LDL CALC: 31 mg/dL (ref 0–99)
Total CHOL/HDL Ratio: 2.8 Ratio
Triglycerides: 168 mg/dL — ABNORMAL HIGH (ref <150)
VLDL: 34 mg/dL (ref 0–40)

## 2013-11-12 LAB — CBC
HCT: 39.9 % (ref 39.0–52.0)
Hemoglobin: 14.2 g/dL (ref 13.0–17.0)
MCH: 37.6 pg — AB (ref 26.0–34.0)
MCHC: 35.6 g/dL (ref 30.0–36.0)
MCV: 105.6 fL — AB (ref 78.0–100.0)
Platelets: 169 10*3/uL (ref 150–400)
RBC: 3.78 MIL/uL — AB (ref 4.22–5.81)
RDW: 13.9 % (ref 11.5–15.5)
WBC: 5 10*3/uL (ref 4.0–10.5)

## 2013-11-13 LAB — HIV-1 RNA QUANT-NO REFLEX-BLD: HIV-1 RNA Quant, Log: <1.3 {Log} (ref <1.30)

## 2013-11-13 LAB — T-HELPER CELL (CD4) - (RCID CLINIC ONLY)
CD4 T CELL ABS: 1120 /uL (ref 400–2700)
CD4 T CELL HELPER: 41 % (ref 33–55)

## 2013-11-13 LAB — RPR TITER: RPR Titer: 1:1 {titer}

## 2013-11-13 LAB — RPR: RPR Ser Ql: REACTIVE — AB

## 2013-11-13 LAB — T.PALLIDUM AB, TOTAL: T pallidum Antibodies (TP-PA): >8 S/CO — ABNORMAL HIGH (ref <0.90)

## 2013-11-15 ENCOUNTER — Encounter: Payer: Self-pay | Admitting: *Deleted

## 2013-11-15 DIAGNOSIS — B20 Human immunodeficiency virus [HIV] disease: Secondary | ICD-10-CM

## 2013-11-15 NOTE — Telephone Encounter (Signed)
Opened in error

## 2013-11-19 ENCOUNTER — Telehealth: Payer: Self-pay | Admitting: Cardiovascular Disease

## 2013-11-19 NOTE — Telephone Encounter (Signed)
New message   Call patient to give appt for upcoming cardiac mri on  2/2 @ am     Patient stated he was catastrophic . Please advise on alternative .

## 2013-11-19 NOTE — Telephone Encounter (Signed)
Called stating he is scheduled for cardiac MRI on Mon 2/2.  States he gets very claustrophobic.  Has had knee MRI in past.  States he has never been given any medication to help him relax. Advised would forward to Dr. [name] for advice.

## 2013-11-19 NOTE — Telephone Encounter (Signed)
If he can get a ride to the scan we can have him pick up a script for valium 5mg  to take 1 hour before scan

## 2013-11-20 ENCOUNTER — Telehealth: Payer: Self-pay | Admitting: Cardiovascular Disease

## 2013-11-20 NOTE — Telephone Encounter (Signed)
SPOKE WITH  PT  HAS  DECIDED TO  HAVE  MRI  DONE   BUT  WISHES TO PROCEED  WITHOUT  TAKING  VALIUM  .Jacob [name]

## 2013-11-20 NOTE — Telephone Encounter (Signed)
Follow up        Pt will be at [facility] 8am on Monday Morning

## 2013-11-20 NOTE — Telephone Encounter (Signed)
Pt returned call  States he spoke with [facility] who has advised him to have an MRI// Please call back to discuss further//SR

## 2013-11-20 NOTE — Telephone Encounter (Signed)
CALLED PT  TO INFORM MAY HAVE VALIUM PRIOR  TO TEST  BUT NEEDS  SOMEONE TO DRIVE  HIM  AFTER TALKING TO  PT  STATES  "I  DO NOT WANT   DEFIB MY  DAD  DIED FROM HAVING  PROCEDURE DONE " DOES NOT  WANT TO HAVE MRI DONE  EITHER  TEST   CANCELED PER  PT WILL  KEEP  6 MO APPT./CY

## 2013-11-21 ENCOUNTER — Telehealth: Payer: Self-pay | Admitting: Cardiovascular Disease

## 2013-11-21 NOTE — Telephone Encounter (Signed)
Pt LM w/scheduler that he received a denial letter for his upcoming Cardiac MRI on 11-25-13.  Initial authorization request was denied. Case was "REOPENED" and additional clinicals were submitted, i.e., echo images were "poor".  Per Ms. Schwining, [employer], [facility], case is currently in review.  Left msg on pt's cell# notifying him of case status.  Also left my direct phone# for any further questions pt may have.

## 2013-11-25 ENCOUNTER — Ambulatory Visit (HOSPITAL_COMMUNITY): Payer: PRIVATE HEALTH INSURANCE

## 2013-11-25 ENCOUNTER — Ambulatory Visit (HOSPITAL_COMMUNITY)
Admission: RE | Admit: 2013-11-25 | Discharge: 2013-11-25 | Disposition: A | Discharge status: Home / Self Care | Payer: PRIVATE HEALTH INSURANCE | Source: Ambulatory Visit | Attending: Cardiovascular Disease | Admitting: Cardiovascular Disease

## 2013-11-25 DIAGNOSIS — I2589 Other forms of chronic ischemic heart disease: Secondary | ICD-10-CM

## 2013-11-25 DIAGNOSIS — R931 Abnormal findings on diagnostic imaging of heart and coronary circulation: Secondary | ICD-10-CM

## 2013-11-25 DIAGNOSIS — I517 Cardiomegaly: Secondary | ICD-10-CM | POA: Insufficient documentation

## 2013-11-25 DIAGNOSIS — I255 Ischemic cardiomyopathy: Secondary | ICD-10-CM

## 2013-11-25 MED ORDER — GADOBENATE DIMEGLUMINE 529 MG/ML IV SOLN
30.0000 mL | Freq: Once | INTRAVENOUS | Status: AC
  Administered 2013-11-25: 27 mL via INTRAVENOUS

## 2013-11-26 ENCOUNTER — Encounter: Payer: Self-pay | Admitting: Internal Medicine

## 2013-11-26 ENCOUNTER — Ambulatory Visit (INDEPENDENT_AMBULATORY_CARE_PROVIDER_SITE_OTHER): Payer: PRIVATE HEALTH INSURANCE | Admitting: Internal Medicine

## 2013-11-26 ENCOUNTER — Encounter (HOSPITAL_BASED_OUTPATIENT_CLINIC_OR_DEPARTMENT_OTHER): Payer: PRIVATE HEALTH INSURANCE | Attending: General Surgery

## 2013-11-26 ENCOUNTER — Telehealth: Payer: Self-pay | Admitting: *Deleted

## 2013-11-26 VITALS — BP 166/91 | HR 60 | Temp 98.3°F | Ht 74.0 in | Wt 186.0 lb

## 2013-11-26 DIAGNOSIS — L97809 Non-pressure chronic ulcer of other part of unspecified lower leg with unspecified severity: Secondary | ICD-10-CM | POA: Insufficient documentation

## 2013-11-26 DIAGNOSIS — E1169 Type 2 diabetes mellitus with other specified complication: Secondary | ICD-10-CM | POA: Insufficient documentation

## 2013-11-26 DIAGNOSIS — B20 Human immunodeficiency virus [HIV] disease: Secondary | ICD-10-CM

## 2013-11-26 MED ORDER — ELVITEG-COBIC-EMTRICIT-TENOFDF 150-150-200-300 MG PO TABS: 1.0000 | ORAL_TABLET | Freq: Every day | ORAL | Status: DC

## 2013-11-26 NOTE — Telephone Encounter (Signed)
TEST RESULTS FAXED  PER   PT'S REQUEST .Adonis Housekeeper

## 2013-11-26 NOTE — Telephone Encounter (Signed)
New problem    Pt want results of his MRI faxed to  Alessandra Grout [phone]. If any questions please call pt.

## 2013-11-26 NOTE — Progress Notes (Signed)
[name], male   DOB: 1951-05-25, 63 y.o.   MRN: [id number]          [facility]  Patient Active Problem List   Diagnosis Date Noted  . HIV DISEASE 09/02/2006    Priority: High  . Diarrhea 09/08/2013  . Shock 09/06/2013  . Ulcer of lower extremity 09/06/2013  . Community acquired pneumonia 09/06/2013  . Hyponatremia 09/06/2013  . Hyperkalemia 09/06/2013  . Acute kidney injury 09/06/2013  . Hyperglycemia 09/06/2013  . Elevated transaminase level 09/06/2013  . Chest pain 09/06/2013  . Type II or unspecified type diabetes mellitus with unspecified complication, not stated as uncontrolled 10/22/2012  . Black stool 04/17/2012  . DM II, diet controlled 12/30/2011  . Bunion of left foot 11/25/2011  . Bunion, right foot 11/25/2011  . Polysubstance abuse 07/28/2011  . Hip fracture, left 04/04/2011  . Insomnia 02/18/2011  . CORONARY ATHEROSCLEROSIS NATIVE CORONARY ARTERY 04/20/2009  . Chronic combined systolic and diastolic congestive heart failure 04/20/2009  . HYPERLIPIDEMIA 11/20/2006  . GOUT 11/20/2006  . TOBACCO ABUSE 11/20/2006  . HYPERTENSION 11/20/2006  . SYPHILIS 09/02/2006    Patient's Medications  New Prescriptions   ELVITEGRAVIR-COBICISTAT-EMTRICITABINE-TENOFOVIR (STRIBILD) 150-150-200-300 MG TABS TABLET    Take 1 tablet by mouth daily with breakfast.  Previous Medications   ALBUTEROL (PROVENTIL HFA;VENTOLIN HFA) 108 (90 BASE) MCG/ACT INHALER    Inhale 1-2 puffs into the lungs every 6 (six) hours as needed for wheezing or shortness of breath.   ALLOPURINOL (ZYLOPRIM) 300 MG TABLET    Take 1 tablet (300 mg total) by mouth daily.   ASPIRIN 81 MG TABLET    Take 81 mg by mouth daily.     AZITHROMYCIN (ZITHROMAX) 250 MG TABLET    Take 1 tablet (250 mg total) by mouth daily. Take first 2 tablets together, then 1 every day until finished.   CARVEDILOL (COREG) 25 MG TABLET    Take 1 tablet (25 mg total) by mouth 2 (two) times daily  with a meal.   CEFUROXIME (CEFTIN) 500 MG TABLET    Take 1 tablet (500 mg total) by mouth 2 (two) times daily with a meal.   CHLORPHENIRAMINE-HYDROCODONE (TUSSIONEX PENNKINETIC ER) 10-8 MG/5ML LQCR    Take 5 mLs by mouth every 12 (twelve) hours as needed for cough.   COLCHICINE 0.6 MG TABLET    Take 0.6 mg by mouth continuous as needed. For gout.   COLLAGENASE (SANTYL) OINTMENT    Apply 1 application topically daily. Apply to wound on ankle   ENALAPRIL (VASOTEC) 10 MG TABLET    Take 1 tablet by mouth 2 (two) times daily.   GLUCOSAMINE-CHONDROIT-VIT C-MN (GLUCOSAMINE CHONDR 1500 COMPLX PO)    Take 2 tablets by mouth daily.    LATANOPROST (XALATAN) 0.005 % OPHTHALMIC SOLUTION    Place 1 drop into both eyes daily.   METFORMIN (GLUCOPHAGE) 500 MG TABLET    Take 500 mg by mouth 2 (two) times daily with a meal.   NAFTIFINE (NAFTIN) 1 % CREAM    Apply 1 application topically daily as needed. Every time pt washes his feet he applies cream.   NITROGLYCERIN (NITROSTAT) 0.3 MG SL TABLET    Place 1 tablet (0.3 mg total) under the tongue every 5 (five) minutes as needed.  Modified Medications   No medications on file  Discontinued Medications   LAMIVUDINE-ZIDOVUDINE (COMBIVIR) 150-300 MG PER TABLET    TAKE 1 TABLET BY MOUTH    TWICE  A DAY (EVERY 12     HOURS)   REYATAZ 200 MG CAPSULE    TAKE 2 CAPSULES (400MG )   BY MOUTH ONCE EVERY DAY   IN THE MORNING WITH       BREAKFAST    Subjective: Ray is in for his routine visit. He has not missed any doses of his Combivir or Reyataz since his last visit. He has been on this regimen for years without any difficulty. He does not recall the names of any other medicines that he took previously but he thinks he was on some other regimens briefly in the late 90s. He does not recall ever being told that his medications were not working. Review of Systems: Pertinent items are noted in HPI.  Past Medical History  Diagnosis Date  . Syphilis, unspecified   . Coronary  atherosclerosis of native coronary artery     s/p surgery  . Type II or unspecified type diabetes mellitus without mention of complication, not stated as uncontrolled 2004    Diagnosed in 2004. Came to our clinic in 5/07 and was on Glucotrol XL 10 mg BID and metformin 500 BID. Max A1C on those two meds was 6.9 on 03/10/08. He has been able to be removed from the meds while maintaining a good A1C. He has DM neuropathy (although neuropathy could be multifactorial.)  . Human immunodeficiency virus (HIV) disease   . Allergic rhinitis, cause unspecified   . Gout, unspecified   . Hyperlipidemia   . HTN (hypertension)   . Ischemic cardiomyopathy   . Drug abuse     hx; tested for cocaine as recently as 2/08. says he is not using drugs now - avoided defib. for this reason   . Acute maxillary sinusitis   . Backache, unspecified   . Coronary atherosclerosis of artery bypass graft   . Closed fracture of intertrochanteric section of femur   . Chronic kidney disease, unspecified   . Tobacco use disorder   . History of hepatitis C     History  Substance Use Topics  . Smoking status: Current Every Day Smoker -- 0.50 packs/day for 36 years    Types: Cigarettes  . Smokeless tobacco: Never Used  . Alcohol Use: 0.0 oz/week     Comment: 2 - 24 oz beers / week    Family History  Problem Relation Age of Onset  . Heart failure Father   . Heart attack Brother   . Drug abuse Brother   . Colon cancer Neg Hx     Allergies  Allergen Reactions  . Amphetamines Other (See Comments)    unknown    Objective:    There is no weight on file to calculate BMI.  General: He is in no distress Oral: No oropharyngeal lesions Skin: No rash Lungs: Clear Cor: Regular S1 and S2 no murmur  Lab Results Lab Results  Component Value Date   WBC 5.0 11/12/2013   HGB 14.2 11/12/2013   HCT 39.9 11/12/2013   MCV 105.6* 11/12/2013   PLT 169 11/12/2013    Lab Results  Component Value Date   CREATININE 1.24  11/12/2013   BUN 22 11/12/2013   NA 140 11/12/2013   K 4.7 11/12/2013   CL 102 11/12/2013   CO2 30 11/12/2013    Lab Results  Component Value Date   ALT 29 11/12/2013   AST 25 11/12/2013   ALKPHOS 65 11/12/2013   BILITOT 1.2 11/12/2013    Lab Results  Component Value  Date   CHOL 102 11/12/2013   HDL 37* 11/12/2013   LDLCALC 31 11/12/2013   TRIG 168* 11/12/2013   CHOLHDL 2.8 11/12/2013    Lab Results HIV 1 RNA Quant (copies/mL)  Date Value  11/12/2013 <20   09/07/2013 <20   04/30/2013 <20      CD4 T Cell Abs (/uL)  Date Value  11/12/2013 1120   09/07/2013 580   10/09/2012 880      Assessment: His HIV infection is under very good control. He is on a rather unconventional and old regimen that may not be as durable as newer regimens. He is also a candidate to simplify his regimen to once daily Stribild.  Plan: 1. Start Stribild 1 daily 2. Discontinue Combivir and Reyataz 3. Followup after lab work in [place] months   [name], MD [facility] for [place] [phone] pager   [phone] cell 11/26/2013, 10:13 AM

## 2013-11-26 NOTE — Telephone Encounter (Signed)
Office notes from today's visit faxed at patient's request to [facility] at [phone].  Confirmation received. Landis Gandy, RN

## 2013-12-02 NOTE — Progress Notes (Signed)
[name], male   DOB: [birth year], 63 y.o.   MRN: [id number] HPI: Jacob [name] is a 63 y.o. male is here for his routine follow up visit.   Allergies: Allergies  Allergen Reactions  . Amphetamines Other (See Comments)    unknown    Vitals:    Past Medical History: Past Medical History  Diagnosis Date  . Syphilis, unspecified   . Coronary atherosclerosis of native coronary artery     s/p surgery  . Type II or unspecified type diabetes mellitus without mention of complication, not stated as uncontrolled 2004    Diagnosed in 2004. Came to our clinic in 5/07 and was on Glucotrol XL 10 mg BID and metformin 500 BID. Max A1C on those two meds was 6.9 on 03/10/08. He has been able to be removed from the meds while maintaining a good A1C. He has DM neuropathy (although neuropathy could be multifactorial.)  . Human immunodeficiency virus (HIV) disease   . Allergic rhinitis, cause unspecified   . Gout, unspecified   . Hyperlipidemia   . HTN (hypertension)   . Ischemic cardiomyopathy   . Drug abuse     hx; tested for cocaine as recently as 2/08. says he is not using drugs now - avoided defib. for this reason   . Acute maxillary sinusitis   . Backache, unspecified   . Coronary atherosclerosis of artery bypass graft   . Closed fracture of intertrochanteric section of femur   . Chronic kidney disease, unspecified   . Tobacco use disorder   . History of hepatitis C     Social History: History   Social History  . Marital Status: Single    Spouse Name: N/A    Number of Children: N/A  . Years of Education: N/A   Social History Main Topics  . Smoking status: Current Every Day Smoker -- 0.50 packs/day for 36 years    Types: Cigarettes  . Smokeless tobacco: Never Used  . Alcohol Use: 0.0 oz/week     Comment: 2 - 24 oz beers / week  . Drug Use: No     Comment: none since 2008 (cocaine use)  . Sexual Activity: Yes    Partners: Male     Comment: declined condoms    Other Topics Concern  . None   Social History Narrative   Retired.     Previous Regimen:   Current Regimen: ATV + Combivir  Labs: HIV 1 RNA Quant (copies/mL)  Date Value  11/12/2013 <20   09/07/2013 <20   04/30/2013 <20      CD4 T Cell Abs (/uL)  Date Value  11/12/2013 1120   09/07/2013 580   10/09/2012 880      Hep B S Ab (no units)  Date Value  12/18/2006 NO      Hepatitis B Surface Ag (no units)  Date Value  06/30/2011 NEGATIVE      HCV Ab (no units)  Date Value  06/30/2011 NEGATIVE     CrCl: Estimated Creatinine Clearance: 70 ml/min (by C-G formula based on Cr of 1.24).  Lipids:    Component Value Date/Time   CHOL 102 11/12/2013 0939   TRIG 168* 11/12/2013 0939   HDL 37* 11/12/2013 0939   CHOLHDL 2.8 11/12/2013 0939   VLDL 34 11/12/2013 0939   LDLCALC 31 11/12/2013 0939    Assessment: 63 yo who is very well controlled on his current regimen. He is currently on an unboosted ATV. His compliance  has been good. I discussed with Dr. [name] about switching him to stribild for ease of dosing.   Recommendations: Dc current regimen Start Stribild 1 tab PO qday  [name], PharmD Clinical Infectious [place] [facility] 12/02/2013, 8:54 PM

## 2013-12-10 ENCOUNTER — Ambulatory Visit: Payer: Medicare Other | Admitting: Podiatry

## 2013-12-16 LAB — HM DIABETES EYE EXAM

## 2013-12-24 ENCOUNTER — Encounter (HOSPITAL_BASED_OUTPATIENT_CLINIC_OR_DEPARTMENT_OTHER): Payer: PRIVATE HEALTH INSURANCE | Attending: General Surgery

## 2013-12-24 DIAGNOSIS — L97509 Non-pressure chronic ulcer of other part of unspecified foot with unspecified severity: Secondary | ICD-10-CM | POA: Insufficient documentation

## 2013-12-24 DIAGNOSIS — E1169 Type 2 diabetes mellitus with other specified complication: Secondary | ICD-10-CM | POA: Insufficient documentation

## 2013-12-30 ENCOUNTER — Other Ambulatory Visit: Payer: Self-pay | Admitting: *Deleted

## 2013-12-30 MED ORDER — CARVEDILOL 25 MG PO TABS: 25.0000 mg | ORAL_TABLET | Freq: Two times a day (BID) | ORAL | Status: DC

## 2013-12-31 ENCOUNTER — Encounter: Payer: Self-pay | Admitting: *Deleted

## 2013-12-31 ENCOUNTER — Ambulatory Visit (INDEPENDENT_AMBULATORY_CARE_PROVIDER_SITE_OTHER): Payer: PRIVATE HEALTH INSURANCE | Admitting: Internal Medicine

## 2013-12-31 ENCOUNTER — Encounter: Payer: Self-pay | Admitting: Internal Medicine

## 2013-12-31 VITALS — BP 168/104 | HR 75 | Ht 74.0 in | Wt 184.0 lb

## 2013-12-31 DIAGNOSIS — I2589 Other forms of chronic ischemic heart disease: Secondary | ICD-10-CM

## 2013-12-31 DIAGNOSIS — I255 Ischemic cardiomyopathy: Secondary | ICD-10-CM

## 2013-12-31 DIAGNOSIS — I509 Heart failure, unspecified: Secondary | ICD-10-CM

## 2013-12-31 DIAGNOSIS — I5042 Chronic combined systolic (congestive) and diastolic (congestive) heart failure: Secondary | ICD-10-CM

## 2013-12-31 MED ORDER — ISOSORBIDE MONONITRATE ER 30 MG PO TB24: 30.0000 mg | ORAL_TABLET | Freq: Every day | ORAL | Status: DC

## 2013-12-31 MED ORDER — HYDRALAZINE HCL 25 MG PO TABS: 25.0000 mg | ORAL_TABLET | Freq: Two times a day (BID) | ORAL | Status: DC

## 2013-12-31 NOTE — Patient Instructions (Signed)
Your physician has recommended you make the following change in your medication:  1) Start Hydralazine 25 mg twice daily 2) Start Isosorbide Mononitrate 30 mg daily  Your physician has requested that you have a lexiscan myoview (not gated). For further information please visit [email or web address]. Please follow instruction sheet, as given.  Your physician recommends that you schedule a follow-up appointment in: 4 weeks with Dr. Caryl Comes.

## 2013-12-31 NOTE — Progress Notes (Signed)
Patient Care Team: Velna Hatchet, MD as PCP - General (Internal Medicine) [name], MD as PCP - Infectious Diseases (Infectious Diseases) Griffith Citron, MD as Consulting Physician (Ophthalmology)   HPI  Jacob [name] is a 63 y.o. male Referred for consideration of an ICD.  He is a history of ischemic heart disease with prior bypass surgery 2004. Echo 2014 demonstrated ejection fraction of 35-40% with inferior wall hypokinesis. MRI scan 2/15 demonstrated ejection fraction 31%. No interval assessment of cardiac perfusion  He has modest functional limitations with dyspnea.  No edema or syncope  No recent chest pain    Past Medical History  Diagnosis Date  . Syphilis, unspecified   . Coronary atherosclerosis of native coronary artery     s/p surgery  . Type II or unspecified type diabetes mellitus without mention of complication, not stated as uncontrolled 2004    Diagnosed in 2004. Came to our clinic in 5/07 and was on Glucotrol XL 10 mg BID and metformin 500 BID. Max A1C on those two meds was 6.9 on 03/10/08. He has been able to be removed from the meds while maintaining a good A1C. He has DM neuropathy (although neuropathy could be multifactorial.)  . Human immunodeficiency virus (HIV) disease   . Allergic rhinitis, cause unspecified   . Gout, unspecified   . Hyperlipidemia   . HTN (hypertension)   . Ischemic cardiomyopathy   . Drug abuse     hx; tested for cocaine as recently as 2/08. says he is not using drugs now - avoided defib. for this reason   . Acute maxillary sinusitis   . Backache, unspecified   . Coronary atherosclerosis of artery bypass graft   . Closed fracture of intertrochanteric section of femur   . Chronic kidney disease, unspecified   . Tobacco use disorder   . History of hepatitis C     Past Surgical History  Procedure Laterality Date  . Coronary artery bypass graft    . Left intertrochanteric hip fracture      s/p intermedullary nail  placement 2/08  . Left transverse mandibular fracture  10/05  . Laproscopic cholecystectomy  8/07    Current Outpatient Prescriptions  Medication Sig Dispense Refill  . albuterol (PROVENTIL HFA;VENTOLIN HFA) 108 (90 BASE) MCG/ACT inhaler Inhale 1-2 puffs into the lungs every 6 (six) hours as needed for wheezing or shortness of breath.  1 Inhaler  0  . allopurinol (ZYLOPRIM) 300 MG tablet Take 1 tablet (300 mg total) by mouth daily.  30 tablet  6  . aspirin 81 MG tablet Take 81 mg by mouth daily.        [name] atorvastatin (LIPITOR) 40 MG tablet       . carvedilol (COREG) 25 MG tablet Take 1 tablet (25 mg total) by mouth 2 (two) times daily with a meal.  180 tablet  1  . colchicine 0.6 MG tablet Take 0.6 mg by mouth continuous as needed. For gout.      . elvitegravir-cobicistat-emtricitabine-tenofovir (STRIBILD) 150-150-200-300 MG TABS tablet Take 1 tablet by mouth daily with breakfast.  30 tablet  11  . enalapril (VASOTEC) 10 MG tablet Take 1 tablet by mouth 2 (two) times daily.      . Glucosamine-Chondroit-Vit C-Mn (GLUCOSAMINE CHONDR 1500 COMPLX PO) Take 2 tablets by mouth daily.       [name] latanoprost (XALATAN) 0.005 % ophthalmic solution Place 1 drop into both eyes daily.      [name]  metFORMIN (GLUCOPHAGE) 500 MG tablet Take 500 mg by mouth 2 (two) times daily with a meal.      . naftifine (NAFTIN) 1 % cream Apply 1 application topically daily as needed. Every time pt washes his feet he applies cream.      . nitroGLYCERIN (NITROSTAT) 0.3 MG SL tablet Place 1 tablet (0.3 mg total) under the tongue every 5 (five) minutes as needed.  25 tablet  3  . [DISCONTINUED] calcium carbonate (OS-CAL) 600 MG TABS Take 600 mg by mouth 2 (two) times daily with a meal.         No current facility-administered medications for this visit.    Allergies  Allergen Reactions  . Amphetamines Other (See Comments)    unknown    social history he lives by himself. He has one son and granddaughter lives downstairs. He  drinks occasionally. He is working on stopping smoking. Denies use of recreational drugs  Review of Systems negative except from HPI and PMH  Physical Exam BP 168/104  Pulse 75  Ht 6\' 2"  (1.88 m)  Wt 184 lb (83.462 kg)  BMI 23.61 kg/m2 Alert and oriented in no acute distress HENT- normal Eyes- EOMI, without scleral icterus Skin- warm and dry; without rashes LN-neg Neck- supple without thyromegaly, JVP-flat, carotids brisk and full without bruits Back-without CVAT or kyphosis Lungs-clear to auscultation CV-Regular rate and rhythm, nl S1 and S2, no murmurs gallops or rubs, S4-absent Abd-soft with active bowel sounds; no midline pulsation or hepatomegaly Pulses-intact femoral and distal MKS-without gross deformity Neuro- Ax O, CN3-12 intact, grossly normal motor and sensory function Affect engaging     Assessment and  Plan  Ischemic Cardiomyopathy  Hypertension  HIV disease  Tobacco abuse  The patient is referred for consideration of an ICD. His ejection fraction 31% by MRI. He has class II symptoms. His appropriately considered for ICD implantation. However, has been some years since his evaluation of his coronary perfusion. He had no symptoms prior to his bypass that he recalls. It has been greater than 10 years. We'll undertake Myoview scanning looking for reversible ischemia which might be contributing to his cardiomyopathic process and interval deterioration in LV function.  In addition, as  hef African American it is appropriately to consider the use of hydralazine nitrates and we will initiate that today.  He has HIV disease followed by [facility]   we have discussed the sensation opportunity for cigarettes including use of lozenges and patches

## 2014-01-01 ENCOUNTER — Telehealth: Payer: Self-pay | Admitting: Internal Medicine

## 2014-01-01 NOTE — Telephone Encounter (Signed)
New message   Patient calling wants office visit fax to Nurse Zada Girt from [employer].     [phone]

## 2014-01-01 NOTE — Telephone Encounter (Signed)
Called to confirm pt wants information sent to [employer]. Pt confirms he would like it sent. Faxed paperwork per his request to given fax number

## 2014-01-03 ENCOUNTER — Encounter: Payer: Self-pay | Admitting: Podiatry

## 2014-01-03 ENCOUNTER — Ambulatory Visit (INDEPENDENT_AMBULATORY_CARE_PROVIDER_SITE_OTHER): Payer: Medicare Other | Admitting: Podiatry

## 2014-01-03 VITALS — BP 168/86 | HR 72 | Resp 12

## 2014-01-03 DIAGNOSIS — M79609 Pain in unspecified limb: Secondary | ICD-10-CM

## 2014-01-03 DIAGNOSIS — B351 Tinea unguium: Secondary | ICD-10-CM

## 2014-01-03 NOTE — Progress Notes (Signed)
He presents today chief complaint of reactive hyperkeratosis plantar aspect the bilateral foot and thick yellow dystrophic painful nails.  Objective: Vital signs are stable he is alert and oriented x3. Pulses are palpable bilateral. Nails are thick yellow dystrophic with mycotic and painful palpation reactive hyperkeratosis plantar aspect of the bilateral foot non-complicated.  Assessment: Pain in limb secondary to onychomycosis and reactive hyperkeratosis bilateral.  Plan: Debridement of nails 1 through 5 bilateral is cover service also debridement of any reactive hyperkeratosis.

## 2014-01-07 ENCOUNTER — Ambulatory Visit: Payer: Medicare Other | Admitting: Podiatry

## 2014-01-13 ENCOUNTER — Encounter: Payer: Self-pay | Admitting: Internal Medicine

## 2014-01-13 ENCOUNTER — Ambulatory Visit (HOSPITAL_COMMUNITY): Payer: PRIVATE HEALTH INSURANCE | Attending: Internal Medicine | Admitting: Radiology

## 2014-01-13 VITALS — BP 183/113 | HR 69 | Ht 74.0 in | Wt 186.0 lb

## 2014-01-13 DIAGNOSIS — I2510 Atherosclerotic heart disease of native coronary artery without angina pectoris: Secondary | ICD-10-CM | POA: Insufficient documentation

## 2014-01-13 DIAGNOSIS — I2589 Other forms of chronic ischemic heart disease: Secondary | ICD-10-CM | POA: Insufficient documentation

## 2014-01-13 DIAGNOSIS — I255 Ischemic cardiomyopathy: Secondary | ICD-10-CM

## 2014-01-13 DIAGNOSIS — R079 Chest pain, unspecified: Secondary | ICD-10-CM | POA: Insufficient documentation

## 2014-01-13 DIAGNOSIS — I5042 Chronic combined systolic (congestive) and diastolic (congestive) heart failure: Secondary | ICD-10-CM | POA: Insufficient documentation

## 2014-01-13 MED ORDER — TECHNETIUM TC 99M SESTAMIBI GENERIC - CARDIOLITE
11.0000 | Freq: Once | INTRAVENOUS | Status: AC | PRN
  Administered 2014-01-13: 11 via INTRAVENOUS

## 2014-01-13 MED ORDER — REGADENOSON 0.4 MG/5ML IV SOLN
0.4000 mg | Freq: Once | INTRAVENOUS | Status: AC
  Administered 2014-01-13: 0.4 mg via INTRAVENOUS

## 2014-01-13 MED ORDER — TECHNETIUM TC 99M SESTAMIBI GENERIC - CARDIOLITE
33.0000 | Freq: Once | INTRAVENOUS | Status: AC | PRN
  Administered 2014-01-13: 33 via INTRAVENOUS

## 2014-01-13 NOTE — Progress Notes (Signed)
[facility] [place], [place] [address] [phone]    Cardiology Nuclear Med Study  Jacob [name] is a 63 y.o. male     MRN : [id number]     DOB: 1950-11-23  Procedure Date: 01/13/2014  Nuclear Med Background Indication for Stress Test:  Evaluation for Ischemia, Surgical Clearance:ICD implantation with Dr. Caryl Comes and Graft Patency History:  CAD, MI, CABG 2004, Echo 2014 EF 35-40%, MPI 2008 EF 35%, Cardiac Risk Factors: Family History - CAD, Hypertension, Lipids, NIDDM and Smoker  Symptoms:  None indicated   Nuclear Pre-Procedure Caffeine/Decaff Intake:  None NPO After: 3:00am   Lungs:  clear O2 Sat: 100% on room air. IV 0.9% NS with Angio Cath:  20g  IV Site: R Hand  IV Started by:  Matilde Haymaker, RN  Chest Size (in):  44 Cup Size: n/a  Height: 6\' 2"  (1.88 m)  Weight:  186 lb (84.369 kg)  BMI:  Body mass index is 23.87 kg/(m^2). Tech Comments:  Coreg taken at [facility] 1 or 2 day study: 1 day  Stress Test Type:  Treadmill/Lexiscan  Reading MD: n/a  Order Authorizing Provider:  Herbert Pun  Resting Radionuclide: Technetium 54m Sestamibi  Resting Radionuclide Dose: 11.0 mCi   Stress Radionuclide:  Technetium 4m Sestamibi  Stress Radionuclide Dose: 33.0 mCi           Stress Protocol Rest HR: 69 Stress HR: 114  Rest BP: 183/113 Stress BP: 139/77  Exercise Time (min): n/a METS: n/a           Dose of Adenosine (mg):  n/a Dose of Lexiscan: 0.4 mg  Dose of Atropine (mg): n/a Dose of Dobutamine: n/a mcg/kg/min (at max HR)  Stress Test Technologist: Glade Lloyd, BS-ES  Nuclear Technologist:  Charlton Amor, CNMT     Rest Procedure:  Myocardial perfusion imaging was performed at rest 45 minutes following the intravenous administration of Technetium 82m Sestamibi. Rest ECG: No acute changes and NSR with IMI and persistant elevation  Stress Procedure:  The patient received IV Lexiscan 0.4 mg over  15-seconds with concurrent low level exercise and then Technetium 60m Sestamibi was injected at 30-seconds while the patient continued walking one more minute.  Quantitative spect images were obtained after a 45-minute delay.  During the infusion of Lexiscan, the patient had a cough, otherwise no symptoms.  Stress ECG: No significant change from baseline ECG  QPS Raw Data Images:  Normal; no motion artifact; normal heart/lung ratio. Stress Images:  Decreased uptake in inferior wall at mid and basal level and decreased uptake in the anterolateral wall at mid and basal level Rest Images:  similar to stress images Subtraction (SDS):  There is a fixed defect that is most consistent with a previous infarction. Transient Ischemic Dilatation (Normal <1.22):  1.06 Lung/Heart Ratio (Normal <0.45):  0.35  Quantitative Gated Spect Images QGS EDV:  223 ml QGS ESV:  156 ml  Impression Exercise Capacity:  Lexiscan with low level exercise. BP Response:  Normal blood pressure response. Clinical Symptoms:  Cough ECG Impression:  No significant ST segment change suggestive of ischemia. Comparison with Prior Nuclear Study: No images to compare  Overall Impression:  High risk stress nuclear study Large area of inferior and anterolateral wall infarcts with no ischemia  Severely reduced EF.  LV Ejection Fraction: 30%.  LV Wall Motion:  Severely reduced EF diffuse worse in the inferior wall   Collier Salina  [employer]

## 2014-01-27 ENCOUNTER — Telehealth: Payer: Self-pay | Admitting: *Deleted

## 2014-01-27 NOTE — Telephone Encounter (Signed)
A user error has taken place: encounter opened in error, closed for administrative reasons.

## 2014-01-28 ENCOUNTER — Encounter (HOSPITAL_BASED_OUTPATIENT_CLINIC_OR_DEPARTMENT_OTHER): Payer: PRIVATE HEALTH INSURANCE | Attending: General Surgery

## 2014-01-28 DIAGNOSIS — L97809 Non-pressure chronic ulcer of other part of unspecified lower leg with unspecified severity: Secondary | ICD-10-CM | POA: Insufficient documentation

## 2014-01-28 DIAGNOSIS — E1169 Type 2 diabetes mellitus with other specified complication: Secondary | ICD-10-CM | POA: Insufficient documentation

## 2014-02-03 ENCOUNTER — Ambulatory Visit (INDEPENDENT_AMBULATORY_CARE_PROVIDER_SITE_OTHER): Payer: PRIVATE HEALTH INSURANCE | Admitting: Internal Medicine

## 2014-02-03 ENCOUNTER — Encounter: Payer: Self-pay | Admitting: Internal Medicine

## 2014-02-03 VITALS — BP 161/86 | HR 69 | Ht 74.0 in | Wt 187.0 lb

## 2014-02-03 DIAGNOSIS — R9439 Abnormal result of other cardiovascular function study: Secondary | ICD-10-CM

## 2014-02-03 DIAGNOSIS — I509 Heart failure, unspecified: Secondary | ICD-10-CM

## 2014-02-03 DIAGNOSIS — I2589 Other forms of chronic ischemic heart disease: Secondary | ICD-10-CM

## 2014-02-03 DIAGNOSIS — I5042 Chronic combined systolic (congestive) and diastolic (congestive) heart failure: Secondary | ICD-10-CM

## 2014-02-03 DIAGNOSIS — I255 Ischemic cardiomyopathy: Secondary | ICD-10-CM

## 2014-02-03 MED ORDER — HYDRALAZINE HCL 25 MG PO TABS: 50.0000 mg | ORAL_TABLET | Freq: Two times a day (BID) | ORAL | Status: DC

## 2014-02-03 NOTE — Patient Instructions (Signed)
Your physician has recommended you make the following change in your medication:  1) INCREASE Hydralazine to 50 mg twice daily  Your physician recommends that you schedule a follow-up appointment in: 4 weeks with Dr. Caryl Comes.

## 2014-02-03 NOTE — Progress Notes (Signed)
Patient Care Team: Velna Hatchet, MD as PCP - General (Internal Medicine) [name], MD as PCP - Infectious Diseases (Infectious Diseases) Griffith Citron, MD as Consulting Physician (Ophthalmology)   HPI  Jacob [name] is a 63 y.o. male Referred for consideration of an ICD.  He is a history of ischemic heart disease with prior bypass surgery 2004. Echo 2014 demonstrated ejection fraction of 35-40% with inferior wall hypokinesis. MRI scan 2/15 demonstrated ejection fraction 31%. Myoview scanning 3/15 demonstrated an EF of 30% a large infarct but no ischemia.  At his last visit we begin him on hydralazine nitrates.  He has modest functional limitations with dyspnea.  No edema or syncope  No recent chest pain    Past Medical History  Diagnosis Date  . Syphilis, unspecified   . Coronary atherosclerosis of native coronary artery     s/p surgery  . Type II or unspecified type diabetes mellitus without mention of complication, not stated as uncontrolled 2004    Diagnosed in 2004. Came to our clinic in 5/07 and was on Glucotrol XL 10 mg BID and metformin 500 BID. Max A1C on those two meds was 6.9 on 03/10/08. He has been able to be removed from the meds while maintaining a good A1C. He has DM neuropathy (although neuropathy could be multifactorial.)  . Human immunodeficiency virus (HIV) disease   . Allergic rhinitis, cause unspecified   . Gout, unspecified   . Hyperlipidemia   . HTN (hypertension)   . Ischemic cardiomyopathy   . Drug abuse     hx; tested for cocaine as recently as 2/08. says he is not using drugs now - avoided defib. for this reason   . Acute maxillary sinusitis   . Backache, unspecified   . Coronary atherosclerosis of artery bypass graft   . Closed fracture of intertrochanteric section of femur   . Chronic kidney disease, unspecified   . Tobacco use disorder   . History of hepatitis C   . GERD (gastroesophageal reflux disease)   . Arthritis      Past Surgical History  Procedure Laterality Date  . Coronary artery bypass graft    . Left intertrochanteric hip fracture      s/p intermedullary nail placement 2/08  . Left transverse mandibular fracture  10/05  . Laproscopic cholecystectomy  8/07    Current Outpatient Prescriptions  Medication Sig Dispense Refill  . albuterol (PROVENTIL HFA;VENTOLIN HFA) 108 (90 BASE) MCG/ACT inhaler Inhale 1-2 puffs into the lungs every 6 (six) hours as needed for wheezing or shortness of breath.  1 Inhaler  0  . allopurinol (ZYLOPRIM) 300 MG tablet Take 1 tablet (300 mg total) by mouth daily.  30 tablet  6  . aspirin 81 MG tablet Take 81 mg by mouth daily.        [name] atorvastatin (LIPITOR) 40 MG tablet       . carvedilol (COREG) 25 MG tablet Take 1 tablet (25 mg total) by mouth 2 (two) times daily with a meal.  180 tablet  1  . colchicine 0.6 MG tablet Take 0.6 mg by mouth continuous as needed. For gout.      . elvitegravir-cobicistat-emtricitabine-tenofovir (STRIBILD) 150-150-200-300 MG TABS tablet Take 1 tablet by mouth daily with breakfast.  30 tablet  11  . enalapril (VASOTEC) 10 MG tablet Take 1 tablet by mouth 2 (two) times daily.      . Glucosamine-Chondroit-Vit C-Mn (GLUCOSAMINE CHONDR 1500 COMPLX PO) Take 2  tablets by mouth daily.       . hydrALAZINE (APRESOLINE) 25 MG tablet Take 1 tablet (25 mg total) by mouth 2 (two) times daily.  60 tablet  1  . isosorbide mononitrate (IMDUR) 30 MG 24 hr tablet Take 1 tablet (30 mg total) by mouth daily.  30 tablet  1  . latanoprost (XALATAN) 0.005 % ophthalmic solution Place 1 drop into both eyes daily.      . metformin (FORTAMET) 1000 MG (OSM) 24 hr tablet Take 1,000 mg by mouth 2 (two) times daily with a meal.      . naftifine (NAFTIN) 1 % cream Apply 1 application topically daily as needed. Every time pt washes his feet he applies cream.      . nitroGLYCERIN (NITROSTAT) 0.3 MG SL tablet Place 1 tablet (0.3 mg total) under the tongue every 5 (five)  minutes as needed.  25 tablet  3  . [DISCONTINUED] calcium carbonate (OS-CAL) 600 MG TABS Take 600 mg by mouth 2 (two) times daily with a meal.         No current facility-administered medications for this visit.    Allergies  Allergen Reactions  . Amphetamines Other (See Comments)    unknown    social history he lives by himself. He has one son and granddaughter lives downstairs. He drinks occasionally. He is working on stopping smoking. Denies use of recreational drugs  Review of Systems negative except from HPI and PMH  Physical Exam There were no vitals taken for this visit. Alert and oriented in no acute distress HENT- normal Eyes- EOMI, without scleral icterus Skin- warm and dry; without rashes LN-neg Neck- supple without thyromegaly, JVP-flat, carotids brisk and full without bruits Back-without CVAT or kyphosis Lungs-clear to auscultation CV-Regular rate and rhythm, nl S1 and S2, no murmurs gallops or rubs, S4-absent Abd-soft with active bowel sounds; no midline pulsation or hepatomegaly Pulses-intact femoral and distal MKS-without gross deformity Neuro- Ax O, CN3-12 intact, grossly normal motor and sensory function Affect engaging     Assessment and  Plan  Ischemic Cardiomyopathy  Systolic heart failure-class I-2  Hypertension  HIV disease  Tobacco abuse     He has HIV disease followed by [facility]   Blood pressure  Is better will increase hydralazine to 25--50 twice a day  Still long discussions re ICD   he would like to defer a decision for month or so.

## 2014-02-06 ENCOUNTER — Other Ambulatory Visit: Payer: Self-pay | Admitting: *Deleted

## 2014-02-06 MED ORDER — NITROGLYCERIN 0.3 MG SL SUBL: 0.3000 mg | SUBLINGUAL_TABLET | SUBLINGUAL | Status: DC | PRN

## 2014-02-07 ENCOUNTER — Ambulatory Visit: Payer: PRIVATE HEALTH INSURANCE | Admitting: Cardiology

## 2014-03-06 ENCOUNTER — Telehealth: Payer: Self-pay | Admitting: Internal Medicine

## 2014-03-06 NOTE — Telephone Encounter (Signed)
Pt requested a refill of isosorbide, wants to make sure dr Caryl Comes wants him to continue, if so he needs a refill [facility]

## 2014-03-07 ENCOUNTER — Ambulatory Visit (INDEPENDENT_AMBULATORY_CARE_PROVIDER_SITE_OTHER): Payer: PRIVATE HEALTH INSURANCE | Admitting: Internal Medicine

## 2014-03-07 ENCOUNTER — Encounter: Payer: Self-pay | Admitting: Internal Medicine

## 2014-03-07 VITALS — BP 170/95 | HR 83 | Ht 74.0 in | Wt 189.0 lb

## 2014-03-07 DIAGNOSIS — I255 Ischemic cardiomyopathy: Secondary | ICD-10-CM

## 2014-03-07 DIAGNOSIS — I2589 Other forms of chronic ischemic heart disease: Secondary | ICD-10-CM

## 2014-03-07 NOTE — Telephone Encounter (Signed)
Pt informed that this was one of the medications that Dr. Caryl Comes wanted him to stop this med for a month to see if diarrhea subsides. Advised to call back in one month to let us know if he is re-starting this medication or not -- if restarting we will send rx in then.

## 2014-03-07 NOTE — Progress Notes (Signed)
Patient Care Team: Velna Hatchet, MD as PCP - General (Internal Medicine) [name], MD as PCP - Infectious Diseases (Infectious Diseases) Griffith Citron, MD as Consulting Physician (Ophthalmology)   HPI  Jacob [name] is a 63 y.o. male Seen about a month ago for discussion of ICD implantation for ischemic cardiomyopathy and . He decided he would like to defer the decision and come back.  He has decided not to pursue ICD  His other complaint is diarrhea. he wonders whether it is medication related.    Past Medical History  Diagnosis Date  . Syphilis, unspecified   . Coronary atherosclerosis of native coronary artery     s/p surgery  . Type II or unspecified type diabetes mellitus without mention of complication, not stated as uncontrolled 2004    Diagnosed in 2004. Came to our clinic in 5/07 and was on Glucotrol XL 10 mg BID and metformin 500 BID. Max A1C on those two meds was 6.9 on 03/10/08. He has been able to be removed from the meds while maintaining a good A1C. He has DM neuropathy (although neuropathy could be multifactorial.)  . Human immunodeficiency virus (HIV) disease   . Allergic rhinitis, cause unspecified   . Gout, unspecified   . Hyperlipidemia   . HTN (hypertension)   . Ischemic cardiomyopathy   . Drug abuse     hx; tested for cocaine as recently as 2/08. says he is not using drugs now - avoided defib. for this reason   . Acute maxillary sinusitis   . Backache, unspecified   . Coronary atherosclerosis of artery bypass graft   . Closed fracture of intertrochanteric section of femur   . Chronic kidney disease, unspecified   . Tobacco use disorder   . History of hepatitis C   . GERD (gastroesophageal reflux disease)   . Arthritis     Past Surgical History  Procedure Laterality Date  . Coronary artery bypass graft    . Left intertrochanteric hip fracture      s/p intermedullary nail placement 2/08  . Left transverse mandibular fracture   10/05  . Laproscopic cholecystectomy  8/07    Current Outpatient Prescriptions  Medication Sig Dispense Refill  . albuterol (PROVENTIL HFA;VENTOLIN HFA) 108 (90 BASE) MCG/ACT inhaler Inhale 1-2 puffs into the lungs every 6 (six) hours as needed for wheezing or shortness of breath.  1 Inhaler  0  . allopurinol (ZYLOPRIM) 300 MG tablet Take 1 tablet (300 mg total) by mouth daily.  30 tablet  6  . aspirin 81 MG tablet Take 81 mg by mouth daily.        [name] atorvastatin (LIPITOR) 40 MG tablet       . carvedilol (COREG) 25 MG tablet Take 1 tablet (25 mg total) by mouth 2 (two) times daily with a meal.  180 tablet  1  . colchicine 0.6 MG tablet Take 0.6 mg by mouth continuous as needed. For gout.      . elvitegravir-cobicistat-emtricitabine-tenofovir (STRIBILD) 150-150-200-300 MG TABS tablet Take 1 tablet by mouth daily with breakfast.  30 tablet  11  . enalapril (VASOTEC) 10 MG tablet Take 1 tablet by mouth 2 (two) times daily.      . Glucosamine-Chondroit-Vit C-Mn (GLUCOSAMINE CHONDR 1500 COMPLX PO) Take 2 tablets by mouth daily.       . hydrALAZINE (APRESOLINE) 25 MG tablet Take 2 tablets (50 mg total) by mouth 2 (two) times daily.  120 tablet  2  .  isosorbide mononitrate (IMDUR) 30 MG 24 hr tablet Take 1 tablet (30 mg total) by mouth daily.  30 tablet  1  . latanoprost (XALATAN) 0.005 % ophthalmic solution Place 1 drop into both eyes daily.      . metformin (FORTAMET) 1000 MG (OSM) 24 hr tablet Take 1,000 mg by mouth 2 (two) times daily with a meal.      . naftifine (NAFTIN) 1 % cream Apply 1 application topically daily as needed. Every time pt washes his feet he applies cream.      . nitroGLYCERIN (NITROSTAT) 0.3 MG SL tablet Place 1 tablet (0.3 mg total) under the tongue every 5 (five) minutes as needed.  25 tablet  0  . [DISCONTINUED] calcium carbonate (OS-CAL) 600 MG TABS Take 600 mg by mouth 2 (two) times daily with a meal.         No current facility-administered medications for this visit.      Allergies  Allergen Reactions  . Amphetamines Other (See Comments)    unknown    Review of Systems negative except from HPI and PMH  Physical Exam BP 170/95  Pulse 83  Ht 6\' 2"  (1.88 m)  Wt 189 lb (85.73 kg)  BMI 24.26 kg/m2 Well developed and well nourished in no acute distress      Assessment and  Plan  Ischemic cardio myopathy  Diarrhea  Hypertension  The patient has elected not to pursue ICD. He is concerned about his diarrhea. I suggested that he exclude his hydralazine and his nitrates until he sees Dr. [name] to see whether this is responsible. In addition, I have highlighted that his blood pressure is extremely high that he will need to keep track of this. He thinks large part of this or white coat hypertension

## 2014-03-07 NOTE — Patient Instructions (Addendum)
No follow up needed at this time with Dr. Caryl Comes.  Please follow up with Dr. [name] as scheduled.  Please hold Hydralazine and Isosorbide for one month to see if diarrhea subsides.

## 2014-04-29 ENCOUNTER — Ambulatory Visit (INDEPENDENT_AMBULATORY_CARE_PROVIDER_SITE_OTHER): Payer: Medicare Other | Admitting: Podiatry

## 2014-04-29 DIAGNOSIS — M79609 Pain in unspecified limb: Secondary | ICD-10-CM

## 2014-04-29 DIAGNOSIS — B351 Tinea unguium: Secondary | ICD-10-CM

## 2014-04-29 DIAGNOSIS — M79676 Pain in unspecified toe(s): Secondary | ICD-10-CM

## 2014-04-29 NOTE — Progress Notes (Signed)
   Subjective:    [name], male    DOB: 25-Oct-1950, 63 y.o.   MRN: [id number]  HPI  Routine debridement, trim callus, no other issues at this time  Review of Systems     Objective:   Physical Exam: Pulses are strongly palpable bilateral. Nails are thick yellow dystrophic with mycotic painful palpation. Reactive hyperkeratosis present bilateral foot.          Assessment & Plan:  Assessment: Pain in limb secondary to onychomycosis 1 through 5 bilateral.  Plan: Debridement of reactive hyperkeratosis and debridement of mycotic nails cover service secondary to pain.

## 2014-05-12 ENCOUNTER — Encounter: Payer: Self-pay | Admitting: Physician Assistant

## 2014-05-12 ENCOUNTER — Other Ambulatory Visit: Payer: Self-pay | Admitting: *Deleted

## 2014-05-12 ENCOUNTER — Ambulatory Visit (INDEPENDENT_AMBULATORY_CARE_PROVIDER_SITE_OTHER): Payer: PRIVATE HEALTH INSURANCE | Admitting: Physician Assistant

## 2014-05-12 VITALS — BP 160/80 | HR 66 | Ht 74.0 in | Wt 185.0 lb

## 2014-05-12 DIAGNOSIS — I10 Essential (primary) hypertension: Secondary | ICD-10-CM

## 2014-05-12 DIAGNOSIS — I2510 Atherosclerotic heart disease of native coronary artery without angina pectoris: Secondary | ICD-10-CM

## 2014-05-12 DIAGNOSIS — I5042 Chronic combined systolic (congestive) and diastolic (congestive) heart failure: Secondary | ICD-10-CM

## 2014-05-12 DIAGNOSIS — I2589 Other forms of chronic ischemic heart disease: Secondary | ICD-10-CM

## 2014-05-12 DIAGNOSIS — E785 Hyperlipidemia, unspecified: Secondary | ICD-10-CM

## 2014-05-12 DIAGNOSIS — I509 Heart failure, unspecified: Secondary | ICD-10-CM

## 2014-05-12 DIAGNOSIS — I255 Ischemic cardiomyopathy: Secondary | ICD-10-CM

## 2014-05-12 MED ORDER — HYDRALAZINE HCL 25 MG PO TABS: 25.0000 mg | ORAL_TABLET | ORAL | Status: DC

## 2014-05-12 NOTE — Patient Instructions (Signed)
Your physician has recommended you make the following change in your medication  1.  Stat back taking hydralazine 25 mg twice a day for 3 to 5 days   2.  Increase to three times a day if no change occurs    Your physician wants you to follow-up in: with Dr  Blima Singer will receive a reminder letter in the mail two months in advance. If you don't receive a letter, please call our office to schedule the follow-up appointment.  CALL IF DIARRHEA CONTINUES AFTER TAKING  Hydralazine

## 2014-05-12 NOTE — Progress Notes (Signed)
Cardiology Office Note    Date:  05/12/2014   ID:  Jacob [name], DOB 11-05-1950, MRN [id number]  PCP:  Velna Hatchet, MD  Cardiologist:  Dr. Jenkins Rouge   Electrophysiologist:  Dr. Virl Axe    History of Present Illness: Jacob [name] is a 63 y.o. male with a history of CAD, status post CABG 2004, ischemic cardiomyopathy, HTN, HL, HIV, hepatitis C. Previously followed by Dr. Verl Blalock. Established with Dr. [name] 10/2013.  Cardiac MRI demonstrated EF < 35%.  Jacob [name] was referred to Dr. Virl Axe for possible AICD.  The patient ultimately declined implantation of ICD.  Jacob [name] returns for follow up.  Jacob [name] is doing well.  The patient denies chest pain, shortness of breath, syncope, orthopnea, PND or significant pedal edema.  Jacob [name] is NYHA 2.     Studies:  - LHC (04/2005):  D1 occluded, proximal circumflex 70%, ostial RCA occluded, ramus branch of the circumflex occluded; SVG-ramus patent, SVG-RCA patent  - Echo (08/2013):  Mild LVH, EF 35-40%, inferior hypokinesis, grade 1 diastolic dysfunction, mild BAE.  - Nuclear (01/13/14):  Inferior and anterolateral infarct, no ischemia, EF 30%; high-risk  - cardiac MRI (10/2011): LVH, EF 31%, multiple regional wall motion abnormalities, full-thickness scar involving lateral and inferolateral walls, mild LAE  Recent Labs: 11/12/2013: ALT 29; Creatinine 1.24; HDL Cholesterol by NMR 37*; Hemoglobin 14.2; LDL (calc) 31; Potassium 4.7   Wt Readings from Last 3 Encounters:  05/12/14 185 lb (83.915 kg)  03/07/14 189 lb (85.73 kg)  02/03/14 187 lb (84.823 kg)     Past Medical History  Diagnosis Date  . Syphilis, unspecified   . Coronary atherosclerosis of native coronary artery     s/p surgery  . Type II or unspecified type diabetes mellitus without mention of complication, not stated as uncontrolled 2004    Diagnosed in 2004. Came to our clinic in 5/07 and was on Glucotrol XL 10 mg BID and metformin 500 BID. Max A1C on those two meds was 6.9 on 03/10/08. Jacob [name]  has been able to be removed from the meds while maintaining a good A1C. 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 Human immunodeficiency virus (HIV) disease   . Allergic rhinitis, cause unspecified   . Gout, unspecified   . Hyperlipidemia   . HTN (hypertension)   . Ischemic cardiomyopathy   . Drug abuse     hx; tested for cocaine as recently as 2/08. says Jacob [name] is not using drugs now - avoided defib. for this reason   . Acute maxillary sinusitis   . Backache, unspecified   . Coronary atherosclerosis of artery bypass graft   . Closed fracture of intertrochanteric section of femur   . Chronic kidney disease, unspecified   . Tobacco use disorder   . History of hepatitis C   . GERD (gastroesophageal reflux disease)   . Arthritis     Current Outpatient Prescriptions  Medication Sig Dispense Refill  . allopurinol (ZYLOPRIM) 300 MG tablet Take 1 tablet (300 mg total) by mouth daily.  30 tablet  6  . aspirin 81 MG tablet Take 81 mg by mouth daily.        [name] atorvastatin (LIPITOR) 40 MG tablet       . carvedilol (COREG) 25 MG tablet Take 1 tablet (25 mg total) by mouth 2 (two) times daily with a meal.  180 tablet  1  . colchicine 0.6 MG tablet Take 0.6 mg by mouth continuous as needed.  For gout.      . elvitegravir-cobicistat-emtricitabine-tenofovir (STRIBILD) 150-150-200-300 MG TABS tablet Take 1 tablet by mouth daily with breakfast.  30 tablet  11  . enalapril (VASOTEC) 10 MG tablet Take 1 tablet by mouth 2 (two) times daily.      . Glucosamine-Chondroit-Vit C-Mn (GLUCOSAMINE CHONDR 1500 COMPLX PO) Take 2 tablets by mouth daily.       [name] LANTUS SOLOSTAR 100 UNIT/ML Solostar Pen Inject 20 Units into the skin daily. diabetes      . latanoprost (XALATAN) 0.005 % ophthalmic solution Place 1 drop into both eyes daily.      . metFORMIN (GLUCOPHAGE) 500 MG tablet Take by mouth 2 (two) times daily with a meal.      . naftifine (NAFTIN) 1 % cream Apply 1 application topically  daily as needed. Every time pt washes his feet Jacob [name] applies cream.      . nitroGLYCERIN (NITROSTAT) 0.3 MG SL tablet Place 1 tablet (0.3 mg total) under the tongue every 5 (five) minutes as needed.  25 tablet  0  . [DISCONTINUED] calcium carbonate (OS-CAL) 600 MG TABS Take 600 mg by mouth 2 (two) times daily with a meal.         No current facility-administered medications for this visit.    Allergies:   Amphetamines   Social History:  The patient  reports that Jacob [name] has been smoking Cigarettes.  Jacob [name] has a 18 pack-year smoking history. Jacob [name] has never used smokeless tobacco. Jacob [name] reports that Jacob [name] drinks alcohol. Jacob [name] reports that Jacob [name] does not use illicit drugs.   Family History:  The patient's family history includes Alzheimer's disease in his mother; Diabetes in his brother; Drug abuse in his brother; Heart attack in his brother; Heart failure in his father; Hypertension in his brother and father; Stroke in his sister. There is no history of Colon cancer.   ROS:  Please see the history of present illness.   No further diarrhea.    All other systems reviewed and negative.   PHYSICAL EXAM: VS:  BP 160/80  Pulse 66  Ht 6\' 2"  (1.88 m)  Wt 185 lb (83.915 kg)  BMI 23.74 kg/m2 Well nourished, well developed, in no acute distress HEENT: normal Neck: no JVD Cardiac:  normal S1, S2; RRR; no murmur Lungs:  clear to auscultation bilaterally, no wheezing, rhonchi or rales Abd: soft, nontender, no hepatomegaly Ext: no edema Skin: warm and dry Neuro:  CNs 2-12 intact, no focal abnormalities noted  EKG:  NSR, HR 66, normal axis, lateral T wave inversions, no change prior tracings     ASSESSMENT AND PLAN:  1. Chronic combined systolic and diastolic congestive heart failure:  Volume stable.  Jacob [name] is NYHA 2.  Jacob [name] is followed by [facility].  Jacob [name] requests that my note be sent to the RN with [facility] Zada Girt at [phone]). 2. Ischemic cardiomyopathy - Continue current dose of Coreg and Enalapril.  I suspect his  diarrhea was more likely related to the Metformin (dose was reduced as well).  I have asked him to resume Hydralazine 25 bid x 3-5 days, then increase to 25 TID.  If Jacob [name] has side effects, Jacob [name] will d/c and let us know.   3. Atherosclerosis of native coronary artery of native heart without angina pectoris - No angina.  Continue ASA, statin, beta blocker.  4. HYPERTENSION - BP elevated.  Try to add Hydralazine back as noted.  Could consider Spironolactone.  Jacob [name] had ARF and hyperkalemia back in 08/2013  but this was in the context of acute illness.   5. HYPERLIPIDEMIA - Continue statin.  LDL 31 in 10/2013. 6. Disposition - f/u with Dr. Jenkins Rouge in 6 mos.    Signed, Versie Starks, MHS 05/12/2014 8:46 AM    [facility] [address], [place], [place]  [address] Phone: [phone]; Fax: [phone]

## 2014-05-15 ENCOUNTER — Other Ambulatory Visit: Payer: PRIVATE HEALTH INSURANCE

## 2014-05-15 DIAGNOSIS — B20 Human immunodeficiency virus [HIV] disease: Secondary | ICD-10-CM

## 2014-05-15 LAB — COMPREHENSIVE METABOLIC PANEL
ALT: 41 U/L (ref 0–53)
AST: 33 U/L (ref 0–37)
Albumin: 3.7 g/dL (ref 3.5–5.2)
Alkaline Phosphatase: 83 U/L (ref 39–117)
BILIRUBIN TOTAL: 0.4 mg/dL (ref 0.2–1.2)
BUN: 26 mg/dL — ABNORMAL HIGH (ref 6–23)
CO2: 21 meq/L (ref 19–32)
CREATININE: 1.49 mg/dL — AB (ref 0.50–1.35)
Calcium: 9.1 mg/dL (ref 8.4–10.5)
Chloride: 108 mEq/L (ref 96–112)
Glucose, Bld: 221 mg/dL — ABNORMAL HIGH (ref 70–99)
Potassium: 4.7 mEq/L (ref 3.5–5.3)
Sodium: 139 mEq/L (ref 135–145)
Total Protein: 7.4 g/dL (ref 6.0–8.3)

## 2014-05-15 LAB — CBC
HCT: 42.5 % (ref 39.0–52.0)
Hemoglobin: 14.9 g/dL (ref 13.0–17.0)
MCH: 32.6 pg (ref 26.0–34.0)
MCHC: 35.1 g/dL (ref 30.0–36.0)
MCV: 93 fL (ref 78.0–100.0)
PLATELETS: 163 10*3/uL (ref 150–400)
RBC: 4.57 MIL/uL (ref 4.22–5.81)
RDW: 14.1 % (ref 11.5–15.5)
WBC: 5.2 10*3/uL (ref 4.0–10.5)

## 2014-05-16 ENCOUNTER — Ambulatory Visit: Payer: PRIVATE HEALTH INSURANCE | Admitting: Cardiovascular Disease

## 2014-05-16 LAB — T-HELPER CELL (CD4) - (RCID CLINIC ONLY)
CD4 T CELL ABS: 860 /uL (ref 400–2700)
CD4 T CELL HELPER: 37 % (ref 33–55)

## 2014-05-19 LAB — HIV-1 RNA QUANT-NO REFLEX-BLD
HIV 1 RNA Quant: <20 copies/mL (ref <20)
HIV-1 RNA Quant, Log: <1.3 {Log} (ref <1.30)

## 2014-05-26 ENCOUNTER — Ambulatory Visit: Payer: PRIVATE HEALTH INSURANCE | Admitting: Internal Medicine

## 2014-06-02 LAB — HM DIABETES EYE EXAM

## 2014-06-03 ENCOUNTER — Ambulatory Visit (INDEPENDENT_AMBULATORY_CARE_PROVIDER_SITE_OTHER): Payer: PRIVATE HEALTH INSURANCE | Admitting: Internal Medicine

## 2014-06-03 ENCOUNTER — Encounter: Payer: Self-pay | Admitting: Internal Medicine

## 2014-06-03 VITALS — BP 155/79 | HR 69 | Temp 98.3°F | Ht 74.0 in | Wt 184.2 lb

## 2014-06-03 DIAGNOSIS — Z23 Encounter for immunization: Secondary | ICD-10-CM

## 2014-06-03 DIAGNOSIS — B20 Human immunodeficiency virus [HIV] disease: Secondary | ICD-10-CM

## 2014-06-03 DIAGNOSIS — Z2911 Encounter for prophylactic immunotherapy for respiratory syncytial virus (RSV): Secondary | ICD-10-CM

## 2014-06-03 NOTE — Progress Notes (Signed)
[name], male   DOB: February 14, 1951, 63 y.o.   MRN: [id number]          Patient Active Problem List   Diagnosis Date Noted  . HIV DISEASE 09/02/2006    Priority: High  . Ischemic cardiomyopathy 05/12/2014  . Diarrhea 09/08/2013  . Shock 09/06/2013  . Ulcer of lower extremity 09/06/2013  . Community acquired pneumonia 09/06/2013  . Hyponatremia 09/06/2013  . Hyperkalemia 09/06/2013  . Acute kidney injury 09/06/2013  . Hyperglycemia 09/06/2013  . Elevated transaminase level 09/06/2013  . Chest pain 09/06/2013  . Type II or unspecified type diabetes mellitus with unspecified complication, not stated as uncontrolled 10/22/2012  . Black stool 04/17/2012  . DM II, diet controlled 12/30/2011  . Bunion of left foot 11/25/2011  . Bunion, right foot 11/25/2011  . Polysubstance abuse 07/28/2011  . Hip fracture, left 04/04/2011  . Insomnia 02/18/2011  . CORONARY ATHEROSCLEROSIS NATIVE CORONARY ARTERY 04/20/2009  . Chronic combined systolic and diastolic congestive heart failure 04/20/2009  . HYPERLIPIDEMIA 11/20/2006  . GOUT 11/20/2006  . TOBACCO ABUSE 11/20/2006  . HYPERTENSION 11/20/2006  . SYPHILIS 09/02/2006    Patient's Medications  New Prescriptions   No medications on file  Previous Medications   ALLOPURINOL (ZYLOPRIM) 300 MG TABLET    Take 1 tablet (300 mg total) by mouth daily.   ASPIRIN 81 MG TABLET    Take 81 mg by mouth daily.     ATORVASTATIN (LIPITOR) 40 MG TABLET       CARVEDILOL (COREG) 25 MG TABLET    Take 1 tablet (25 mg total) by mouth 2 (two) times daily with a meal.   COLCHICINE 0.6 MG TABLET    Take 0.6 mg by mouth continuous as needed. For gout.   ELVITEGRAVIR-COBICISTAT-EMTRICITABINE-TENOFOVIR (STRIBILD) 150-150-200-300 MG TABS TABLET    Take 1 tablet by mouth daily with breakfast.   ENALAPRIL (VASOTEC) 10 MG TABLET    Take 1 tablet by mouth 2 (two) times daily.   GLUCOSAMINE-CHONDROIT-VIT C-MN (GLUCOSAMINE CHONDR 1500 COMPLX PO)    Take 2  tablets by mouth daily.    HYDRALAZINE (APRESOLINE) 25 MG TABLET    Take 1 tablet (25 mg total) by mouth as directed.   LANTUS SOLOSTAR 100 UNIT/ML SOLOSTAR PEN    Inject 20 Units into the skin daily. diabetes   LATANOPROST (XALATAN) 0.005 % OPHTHALMIC SOLUTION    Place 1 drop into both eyes daily.   METFORMIN (GLUCOPHAGE) 500 MG TABLET    Take by mouth 2 (two) times daily with a meal.   NAFTIFINE (NAFTIN) 1 % CREAM    Apply 1 application topically daily as needed. Every time pt washes his feet he applies cream.   NITROGLYCERIN (NITROSTAT) 0.3 MG SL TABLET    Place 1 tablet (0.3 mg total) under the tongue every 5 (five) minutes as needed.  Modified Medications   No medications on file  Discontinued Medications   No medications on file    Subjective: Ray is in for his routine visit. He does not recall missing any doses of his Stribild since his last visit. He takes it every morning at 6:30 AM with his breakfast. He is still smoking cigarettes and has no strong desire to quit. Review of Systems: Pertinent items are noted in HPI.  Past Medical History  Diagnosis Date  . Syphilis, unspecified   . Coronary atherosclerosis of native coronary artery     s/p surgery  . Type II or unspecified type diabetes  mellitus without mention of complication, not stated as uncontrolled 2004    Diagnosed in 2004. Came to our clinic in 5/07 and was on Glucotrol XL 10 mg BID and metformin 500 BID. Max A1C on those two meds was 6.9 on 03/10/08. He has been able to be removed from the meds while maintaining a good A1C. He has DM neuropathy (although neuropathy could be multifactorial.)  . Human immunodeficiency virus (HIV) disease   . Allergic rhinitis, cause unspecified   . Gout, unspecified   . Hyperlipidemia   . HTN (hypertension)   . Ischemic cardiomyopathy   . Drug abuse     hx; tested for cocaine as recently as 2/08. says he is not using drugs now - avoided defib. for this reason   . Acute maxillary  sinusitis   . Backache, unspecified   . Coronary atherosclerosis of artery bypass graft   . Closed fracture of intertrochanteric section of femur   . Chronic kidney disease, unspecified   . Tobacco use disorder   . History of hepatitis C   . GERD (gastroesophageal reflux disease)   . Arthritis     History  Substance Use Topics  . Smoking status: Current Every Day Smoker -- 0.50 packs/day for 36 years    Types: Cigarettes  . Smokeless tobacco: Never Used  . Alcohol Use: 0.0 oz/week     Comment: 2 - 24 oz beers / week    Family History  Problem Relation Age of Onset  . Heart failure Father   . Heart attack Brother   . Drug abuse Brother   . Colon cancer Neg Hx   . Alzheimer's disease Mother   . Stroke Sister   . Hypertension Father   . Hypertension Brother   . Diabetes Brother     Allergies  Allergen Reactions  . Amphetamines Other (See Comments)    unknown    Objective: Temp: 98.3 F (36.8 C) (08/11 0932) BP: 155/79 mmHg (08/11 0932) Pulse Rate: 69 (08/11 0932) Body mass index is 23.65 kg/(m^2).  General: He is in good spirits Oral: No oropharyngeal lesions Skin: No rash Lungs: Clear Cor: Regular S1 and S2 with no murmurs  Lab Results Lab Results  Component Value Date   WBC 5.2 05/15/2014   HGB 14.9 05/15/2014   HCT 42.5 05/15/2014   MCV 93.0 05/15/2014   PLT 163 05/15/2014    Lab Results  Component Value Date   CREATININE 1.49* 05/15/2014   BUN 26* 05/15/2014   NA 139 05/15/2014   K 4.7 05/15/2014   CL 108 05/15/2014   CO2 21 05/15/2014    Lab Results  Component Value Date   ALT 41 05/15/2014   AST 33 05/15/2014   ALKPHOS 83 05/15/2014   BILITOT 0.4 05/15/2014    Lab Results  Component Value Date   CHOL 102 11/12/2013   HDL 37* 11/12/2013   LDLCALC 31 11/12/2013   TRIG 168* 11/12/2013   CHOLHDL 2.8 11/12/2013    Lab Results HIV 1 RNA Quant (copies/mL)  Date Value  05/15/2014 <20   11/12/2013 <20   09/07/2013 <20      CD4 T Cell Abs (/uL)  Date  Value  05/15/2014 860   11/12/2013 1120   09/07/2013 580      Assessment: His HIV infection remains under excellent control.  Plan: 1. Continue Stribild 2. Cigarette cessation counseling provided 3. Followup after lab work in [place] months   [name], MD [facility] for Infectious  Disease [facility] [phone] pager   [phone] cell 06/03/2014, 9:44 AM

## 2014-06-03 NOTE — Patient Instructions (Signed)
Follow-up in one month for Hep B #2.  Please make appt.

## 2014-06-03 NOTE — Addendum Note (Signed)
Addended by: Lorne Skeens D on: 06/03/2014 10:28 AM   Modules accepted: Orders

## 2014-07-01 ENCOUNTER — Other Ambulatory Visit: Payer: Self-pay | Admitting: *Deleted

## 2014-07-01 MED ORDER — CARVEDILOL 25 MG PO TABS: 25.0000 mg | ORAL_TABLET | Freq: Two times a day (BID) | ORAL | Status: DC

## 2014-07-04 ENCOUNTER — Ambulatory Visit (INDEPENDENT_AMBULATORY_CARE_PROVIDER_SITE_OTHER): Payer: PRIVATE HEALTH INSURANCE | Admitting: *Deleted

## 2014-07-04 DIAGNOSIS — Z23 Encounter for immunization: Secondary | ICD-10-CM

## 2014-07-14 ENCOUNTER — Other Ambulatory Visit: Payer: Self-pay

## 2014-07-14 DIAGNOSIS — I10 Essential (primary) hypertension: Secondary | ICD-10-CM

## 2014-07-14 MED ORDER — HYDRALAZINE HCL 25 MG PO TABS: 25.0000 mg | ORAL_TABLET | ORAL | Status: DC

## 2014-07-15 ENCOUNTER — Telehealth: Payer: Self-pay | Admitting: Cardiovascular Disease

## 2014-07-15 NOTE — Telephone Encounter (Signed)
Walk in pt Form "[employer] paper" Dropped off needs Completed gave to [name] 9.22.15/km

## 2014-07-28 ENCOUNTER — Telehealth: Payer: Self-pay | Admitting: *Deleted

## 2014-07-28 DIAGNOSIS — I10 Essential (primary) hypertension: Secondary | ICD-10-CM

## 2014-07-28 MED ORDER — HYDRALAZINE HCL 25 MG PO TABS: 25.0000 mg | ORAL_TABLET | Freq: Three times a day (TID) | ORAL | Status: DC

## 2014-07-28 NOTE — Telephone Encounter (Signed)
Given to Richardson Dopp, [facility] and his assist Arbie Cookey to address and call patient. Arbie Cookey will call pt once Nicki Reaper has reviewed.

## 2014-07-28 NOTE — Telephone Encounter (Signed)
Patient called about the hydralazine. He stated that he has been taking the 25mg  tablet, but he has been taking two of those bid. His last office note with Richardson Dopp has him to take 25mg  bid for three to five days. Not sure if he was to stop taking after that. Please advise. Thanks, MI

## 2014-07-28 NOTE — Telephone Encounter (Signed)
pt was very confused about what dose of hydralazine he should be taking. I went over x 7 with what dose he should be on at this time. I referred back to Dr. Caryl Comes 5/15 OV advised pt to hold x 1 month due to diarrhea, then SW, 7/20 OV to restart at 25 mg bid x 3-5 days then if no diarrhea then increase hydralazine 25 mg to three times daily. There seemed to be some confusion if pt was to restart 25 mg or 50 mg . I said per Richardson Dopp, PA OV 05/12/14 to restart hydralazine 25 mg 1 tablet BID x 3-5 then if no diarrhea then increase to 25 mg three times daily. Pt more confused because he said a refill was sent in 07/14/14 by Guinevere Ferrari with directions of as directed. I advised pt I will be sending in a new Rx with the correct instructions of how he should be taking Hydralazine at this time. Pharmacist notified of dose sig change for hydralazine.

## 2014-07-29 ENCOUNTER — Other Ambulatory Visit: Payer: Self-pay | Admitting: *Deleted

## 2014-07-29 NOTE — Telephone Encounter (Signed)
[facility] sent a refill request for Naftin 2% Cream.  Dr. Marta Antu the refill plus 5 additional refills.

## 2014-08-05 ENCOUNTER — Ambulatory Visit (INDEPENDENT_AMBULATORY_CARE_PROVIDER_SITE_OTHER): Payer: Medicare Other | Admitting: Podiatry

## 2014-08-05 ENCOUNTER — Encounter: Payer: Self-pay | Admitting: Podiatry

## 2014-08-05 DIAGNOSIS — B351 Tinea unguium: Secondary | ICD-10-CM

## 2014-08-05 DIAGNOSIS — E119 Type 2 diabetes mellitus without complications: Secondary | ICD-10-CM

## 2014-08-05 DIAGNOSIS — M79676 Pain in unspecified toe(s): Secondary | ICD-10-CM

## 2014-08-05 DIAGNOSIS — Q828 Other specified congenital malformations of skin: Secondary | ICD-10-CM

## 2014-08-05 NOTE — Progress Notes (Signed)
   Subjective:    [name], male    DOB: [birth year], 63 y.o.   MRN: [id number]  HPI  Pt presents for nail debridement and callus trim  Review of Systems     Objective:   Physical Exam pulses are strongly palpable bilateral. Hallux abductovalgus deformity hammertoe deformities are noted bilateral. He has pain on palpation nails 1 through 5 bilateral which are thick yellow dystrophic and clinically mycotic. He also has pain on palpation sub-first and fifth metatarsals bilaterally this is resulting in a reactive hyperkeratosis bilateral.        Assessment & Plan:  Assessment: Diabetes mellitus diet-controlled. Porokeratosis sub-fifth and sub-first bilateral. Pain in limb secondary to onychomycosis 1 through 5 bilateral.  Plan: Debridement of all reactive hyperkeratosis bilateral. Debridement of nails 1 through 5 bilateral covered service secondary to pain. Followup with him in 3 months

## 2014-10-01 ENCOUNTER — Other Ambulatory Visit: Payer: Self-pay | Admitting: *Deleted

## 2014-10-01 MED ORDER — CARVEDILOL 25 MG PO TABS: 25.0000 mg | ORAL_TABLET | Freq: Two times a day (BID) | ORAL | Status: DC

## 2014-10-28 ENCOUNTER — Other Ambulatory Visit: Payer: Self-pay | Admitting: Internal Medicine

## 2014-11-04 ENCOUNTER — Ambulatory Visit: Payer: Medicare Other | Admitting: Podiatry

## 2014-11-06 ENCOUNTER — Ambulatory Visit (INDEPENDENT_AMBULATORY_CARE_PROVIDER_SITE_OTHER): Payer: Medicare Other | Admitting: Podiatry

## 2014-11-06 DIAGNOSIS — Q828 Other specified congenital malformations of skin: Secondary | ICD-10-CM

## 2014-11-06 DIAGNOSIS — E119 Type 2 diabetes mellitus without complications: Secondary | ICD-10-CM

## 2014-11-06 DIAGNOSIS — M79673 Pain in unspecified foot: Secondary | ICD-10-CM

## 2014-11-06 DIAGNOSIS — B351 Tinea unguium: Secondary | ICD-10-CM

## 2014-11-06 NOTE — Progress Notes (Signed)
   Subjective:    [name], male    DOB: Jul 30, 1951, 64 y.o.   MRN: [id number]  HPI  Pt presents for nail debridement and callus trim  Review of Systems     Objective:   Physical Exam pulses are strongly palpable bilateral. Hallux abductovalgus deformity hammertoe deformities are noted bilateral. He has pain on palpation nails 1 through 5 bilateral which are thick yellow dystrophic and clinically mycotic. He also has pain on palpation sub-first and fifth metatarsals bilaterally this is resulting in a reactive hyperkeratosis bilateral.        Assessment & Plan:  Assessment: Diabetes mellitus diet-controlled. Porokeratosis sub-fifth and sub-first bilateral. Pain in limb secondary to onychomycosis 1 through 5 bilateral.  Plan: Debridement of all reactive hyperkeratosis bilateral. Debridement of nails 1 through 5 bilateral covered service secondary to pain. Followup with him in 3 months

## 2014-11-12 NOTE — Progress Notes (Signed)
[name], male   DOB: Apr 27, 1951, 64 y.o.   MRN: [id number]  64 yo patient previously seen by Dr Verl Blalock.  History of ischemic DCM  CABG 2004   Echo 2014   Study Conclusions  - Left ventricle: The cavity size was moderately dilated. Wall thickness was increased in a pattern of mild LVH. Systolic function was moderately reduced. The estimated ejection fraction was in the range of 35% to 40%. There is severe hypokinesis of the entireinferior myocardium. Doppler parameters are consistent with abnormal left ventricular relaxation (grade 1 diastolic dysfunction). - Left atrium: The atrium was mildly dilated. - Right atrium: The atrium was mildly dilated.  Dr Yolanda Bonine notes very general with no specific details about CABG or bypass grafts  Will have to pull reocords from cone  History of drug use  Denies at this time  Has wound on LLE and seeing wound center.  ? From venous insuf.     MRI done 2/15 with EF 31% reviewed   IMPRESSION: 1) Moderate LVE with LVH EF 31% with multiple RWMAls as described above Consistent with multi vessel CAD  2) Full thickness scar involving the lateral and inferolateral walls  3) Mild LAE  4) Normal RA/RV  Myovue 3/15 Reviewd No ischemia and confirms EF under 35% Overall Impression: High risk stress nuclear study Large area of inferior and anterolateral wall infarcts with no ischemia Severely reduced EF.  LV Ejection Fraction: 30%. LV Wall Motion: Severely reduced EF diffuse worse in the inferior wall   Seen by Dr Caryl Comes on multiple occasions and patient defers decision about AICD.  ? diarrhea with hydralazine  BP not well controlled Patient indicates white coat syndrome   ROS: Denies fever, malais, weight loss, blurry vision, decreased visual acuity, cough, sputum, SOB, hemoptysis, pleuritic pain, palpitaitons, heartburn, abdominal pain, melena, lower extremity edema, claudication, or rash.  All other systems reviewed and  negative  There were no vitals filed for this visit. General: Affect appropriate Thin black male  HEENT: normal Neck supple with no adenopathy JVP normal no bruits no thyromegaly Lungs clear with no wheezing and good diaphragmatic motion Heart:  S1/S2 no murmur,rub, gallop or click PMI normal Abdomen: benighn, BS positve, no tenderness, no AAA no bruit.  No HSM or HJR Distal pulses intact with no bruits No edema Neuro non-focal Skin warm and dry ulcer wrapped in LLE with bilateral varicose veins  No muscular weakness  Medications Current Outpatient Prescriptions  Medication Sig Dispense Refill  . allopurinol (ZYLOPRIM) 300 MG tablet Take 1 tablet (300 mg total) by mouth daily. 30 tablet 6  . aspirin 81 MG tablet Take 81 mg by mouth daily.      [name] atorvastatin (LIPITOR) 40 MG tablet     . B-D UF III MINI PEN NEEDLES 31G X 5 MM MISC   0  . carvedilol (COREG) 25 MG tablet Take 1 tablet (25 mg total) by mouth 2 (two) times daily with a meal. 180 tablet 0  . colchicine 0.6 MG tablet Take 0.6 mg by mouth continuous as needed. For gout.    . enalapril (VASOTEC) 10 MG tablet Take 1 tablet by mouth 2 (two) times daily.    . Glucosamine-Chondroit-Vit C-Mn (GLUCOSAMINE CHONDR 1500 COMPLX PO) Take 2 tablets by mouth daily.     . hydrALAZINE (APRESOLINE) 25 MG tablet Take 1 tablet (25 mg total) by mouth 3 (three) times daily. 90 tablet 3  . LANTUS SOLOSTAR 100 UNIT/ML Solostar Pen Inject  20 Units into the skin daily. diabetes    . latanoprost (XALATAN) 0.005 % ophthalmic solution Place 1 drop into both eyes daily.    . metFORMIN (GLUCOPHAGE) 500 MG tablet Take by mouth 2 (two) times daily with a meal.    . naftifine (NAFTIN) 1 % cream Apply 1 application topically daily as needed. Every time pt washes his feet he applies cream.    . nitroGLYCERIN (NITROSTAT) 0.3 MG SL tablet Place 1 tablet (0.3 mg total) under the tongue every 5 (five) minutes as needed. 25 tablet 0  . STRIBILD 150-150-200-300  MG TABS tablet TAKE 1 TABLET BY MOUTH DAILY WITH BREAKFAST. 30 tablet 10  . [DISCONTINUED] calcium carbonate (OS-CAL) 600 MG TABS Take 600 mg by mouth 2 (two) times daily with a meal.       No current facility-administered medications for this visit.    Allergies Amphetamines  Family History: Family History  Problem Relation Age of Onset  . Heart failure Father   . Heart attack Brother   . Drug abuse Brother   . Colon cancer Neg Hx   . Alzheimer's disease Mother   . Stroke Sister   . Hypertension Father   . Hypertension Brother   . Diabetes Brother     Social History: History   Social History  . Marital Status: Single    Spouse Name: N/A    Number of Children: N/A  . Years of Education: N/A   Occupational History  . Not on file.   Social History Main Topics  . Smoking status: Current Every Day Smoker -- 0.50 packs/day for 36 years    Types: Cigarettes  . Smokeless tobacco: Never Used  . Alcohol Use: 0.0 oz/week     Comment: 2 - 24 oz beers / week  . Drug Use: No     Comment: none since 2008 (cocaine use)  . Sexual Activity:    Partners: Male     Comment: declined condoms   Other Topics Concern  . Not on file   Social History Narrative   Retired.     Electrocardiogram:  05/12/14  SR rate 66 IMI lateral T wave changes   Assessment and Plan

## 2014-11-13 ENCOUNTER — Ambulatory Visit (INDEPENDENT_AMBULATORY_CARE_PROVIDER_SITE_OTHER): Payer: Medicare (Managed Care) | Admitting: Cardiovascular Disease

## 2014-11-13 ENCOUNTER — Encounter: Payer: Self-pay | Admitting: Cardiovascular Disease

## 2014-11-13 VITALS — BP 140/80 | HR 67 | Ht 74.0 in | Wt 187.0 lb

## 2014-11-13 DIAGNOSIS — I2510 Atherosclerotic heart disease of native coronary artery without angina pectoris: Secondary | ICD-10-CM

## 2014-11-13 DIAGNOSIS — I5042 Chronic combined systolic (congestive) and diastolic (congestive) heart failure: Secondary | ICD-10-CM

## 2014-11-13 DIAGNOSIS — I10 Essential (primary) hypertension: Secondary | ICD-10-CM

## 2014-11-13 DIAGNOSIS — I255 Ischemic cardiomyopathy: Secondary | ICD-10-CM

## 2014-11-13 MED ORDER — HYDRALAZINE HCL 25 MG PO TABS: 25.0000 mg | ORAL_TABLET | Freq: Three times a day (TID) | ORAL | Status: DC

## 2014-11-13 MED ORDER — CARVEDILOL 25 MG PO TABS: 25.0000 mg | ORAL_TABLET | Freq: Two times a day (BID) | ORAL | Status: DC

## 2014-11-13 NOTE — Patient Instructions (Signed)
Your physician recommends that you continue on your current medications as directed. Please refer to the Current Medication list given to you today.  Your physician wants you to follow-up in: 6 months with Dr. [name]. You will receive a reminder letter in the mail two months in advance. If you don't receive a letter, please call our office to schedule the follow-up appointment.

## 2014-11-13 NOTE — Assessment & Plan Note (Signed)
Stable with no angina and good activity level.  Continue medical Rx  

## 2014-11-13 NOTE — Assessment & Plan Note (Signed)
Tolerating hydralazine Diarrhea was from glucophage or HIV meds.  Compliance stressed

## 2014-11-13 NOTE — Assessment & Plan Note (Signed)
Refuses AICD Was upset about waiting so long to see Dr Caryl Comes last appt.

## 2014-11-13 NOTE — Assessment & Plan Note (Signed)
Euvolemic continue current meds

## 2014-11-25 ENCOUNTER — Other Ambulatory Visit: Payer: Medicare Other

## 2014-11-25 DIAGNOSIS — B20 Human immunodeficiency virus [HIV] disease: Secondary | ICD-10-CM

## 2014-11-25 LAB — LIPID PANEL
CHOLESTEROL: 106 mg/dL (ref 0–200)
HDL: 25 mg/dL — ABNORMAL LOW (ref >39)
LDL CALC: 29 mg/dL (ref 0–99)
Total CHOL/HDL Ratio: 4.2 Ratio
Triglycerides: 258 mg/dL — ABNORMAL HIGH (ref <150)
VLDL: 52 mg/dL — AB (ref 0–40)

## 2014-11-25 LAB — COMPREHENSIVE METABOLIC PANEL
ALT: 44 U/L (ref 0–53)
AST: 41 U/L — AB (ref 0–37)
Albumin: 3.4 g/dL — ABNORMAL LOW (ref 3.5–5.2)
Alkaline Phosphatase: 69 U/L (ref 39–117)
BUN: 23 mg/dL (ref 6–23)
CALCIUM: 8.9 mg/dL (ref 8.4–10.5)
CO2: 18 mEq/L — ABNORMAL LOW (ref 19–32)
CREATININE: 1.54 mg/dL — AB (ref 0.50–1.35)
Chloride: 105 mEq/L (ref 96–112)
Glucose, Bld: 311 mg/dL — ABNORMAL HIGH (ref 70–99)
Potassium: 4.6 mEq/L (ref 3.5–5.3)
SODIUM: 132 meq/L — AB (ref 135–145)
Total Bilirubin: 0.4 mg/dL (ref 0.2–1.2)
Total Protein: 7.4 g/dL (ref 6.0–8.3)

## 2014-11-25 LAB — CBC
HCT: 44 % (ref 39.0–52.0)
HEMOGLOBIN: 15 g/dL (ref 13.0–17.0)
MCH: 32.3 pg (ref 26.0–34.0)
MCHC: 34.1 g/dL (ref 30.0–36.0)
MCV: 94.8 fL (ref 78.0–100.0)
MPV: 11.9 fL (ref 8.6–12.4)
Platelets: 167 10*3/uL (ref 150–400)
RBC: 4.64 MIL/uL (ref 4.22–5.81)
RDW: 14.1 % (ref 11.5–15.5)
WBC: 5.4 10*3/uL (ref 4.0–10.5)

## 2014-11-26 ENCOUNTER — Other Ambulatory Visit: Payer: Self-pay | Admitting: *Deleted

## 2014-11-26 DIAGNOSIS — B20 Human immunodeficiency virus [HIV] disease: Secondary | ICD-10-CM

## 2014-11-26 LAB — HIV-1 RNA QUANT-NO REFLEX-BLD
HIV 1 RNA Quant: <20 copies/mL (ref <20)
HIV-1 RNA Quant, Log: <1.3 {Log} (ref <1.30)

## 2014-11-26 LAB — T-HELPER CELL (CD4) - (RCID CLINIC ONLY)
CD4 T CELL HELPER: 38 % (ref 33–55)
CD4 T Cell Abs: 870 /uL (ref 400–2700)

## 2014-11-26 LAB — RPR

## 2014-11-26 MED ORDER — ELVITEG-COBIC-EMTRICIT-TENOFDF 150-150-200-300 MG PO TABS: ORAL_TABLET | ORAL | Status: DC

## 2014-11-26 NOTE — Telephone Encounter (Signed)
SPAP Application

## 2014-12-08 ENCOUNTER — Ambulatory Visit: Payer: PRIVATE HEALTH INSURANCE | Admitting: Internal Medicine

## 2014-12-29 ENCOUNTER — Encounter: Payer: Self-pay | Admitting: Internal Medicine

## 2014-12-29 ENCOUNTER — Ambulatory Visit (INDEPENDENT_AMBULATORY_CARE_PROVIDER_SITE_OTHER): Payer: Medicare Other | Admitting: Internal Medicine

## 2014-12-29 DIAGNOSIS — B20 Human immunodeficiency virus [HIV] disease: Secondary | ICD-10-CM

## 2014-12-29 MED ORDER — ELVITEG-COBIC-EMTRICIT-TENOFAF 150-150-200-10 MG PO TABS: 1.0000 | ORAL_TABLET | Freq: Every day | ORAL | Status: DC

## 2014-12-29 NOTE — Progress Notes (Signed)
Patient ID: Jacob TREVIZO, male   DOB: Apr 12, 1951, 64 y.o.   MRN: [id number]          Patient Active Problem List   Diagnosis Date Noted  . Human immunodeficiency virus (HIV) disease 09/02/2006    Priority: High  . Ischemic cardiomyopathy 05/12/2014  . Diarrhea 09/08/2013  . Shock 09/06/2013  . Ulcer of lower extremity 09/06/2013  . Community acquired pneumonia 09/06/2013  . Hyponatremia 09/06/2013  . Hyperkalemia 09/06/2013  . Acute kidney injury 09/06/2013  . Hyperglycemia 09/06/2013  . Elevated transaminase level 09/06/2013  . Chest pain 09/06/2013  . Type II or unspecified type diabetes mellitus with unspecified complication, not stated as uncontrolled 10/22/2012  . Black stool 04/17/2012  . DM II, diet controlled 12/30/2011  . Bunion of left foot 11/25/2011  . Bunion, right foot 11/25/2011  . Polysubstance abuse 07/28/2011  . Hip fracture, left 04/04/2011  . Insomnia 02/18/2011  . CORONARY ATHEROSCLEROSIS NATIVE CORONARY ARTERY 04/20/2009  . Chronic combined systolic and diastolic congestive heart failure 04/20/2009  . HYPERLIPIDEMIA 11/20/2006  . GOUT 11/20/2006  . TOBACCO ABUSE 11/20/2006  . Essential hypertension 11/20/2006  . SYPHILIS 09/02/2006    Patient's Medications  New Prescriptions   ELVITEGRAVIR-COBICISTAT-EMTRICITABINE-TENOFOVIR (GENVOYA) 150-150-200-10 MG TABS TABLET    Take 1 tablet by mouth daily with breakfast.  Previous Medications   ALLOPURINOL (ZYLOPRIM) 300 MG TABLET    Take 1 tablet (300 mg total) by mouth daily.   ASPIRIN 81 MG TABLET    Take 81 mg by mouth daily.     ATORVASTATIN (LIPITOR) 40 MG TABLET       B-D UF III MINI PEN NEEDLES 31G X 5 MM MISC       CARVEDILOL (COREG) 25 MG TABLET    Take 1 tablet (25 mg total) by mouth 2 (two) times daily with a meal.   COLCHICINE 0.6 MG TABLET    Take 0.6 mg by mouth continuous as needed. For gout.   ENALAPRIL (VASOTEC) 10 MG TABLET    Take 1 tablet by mouth 2 (two) times daily.   GLUCOSAMINE-CHONDROIT-VIT C-MN (GLUCOSAMINE CHONDR 1500 COMPLX PO)    Take 2 tablets by mouth daily.    HYDRALAZINE (APRESOLINE) 25 MG TABLET    Take 1 tablet (25 mg total) by mouth 3 (three) times daily.   LANTUS SOLOSTAR 100 UNIT/ML SOLOSTAR PEN    Inject 20 Units into the skin daily. diabetes   LATANOPROST (XALATAN) 0.005 % OPHTHALMIC SOLUTION    Place 1 drop into both eyes daily.   METFORMIN (GLUCOPHAGE) 500 MG TABLET    Take by mouth 2 (two) times daily with a meal.   NAFTIFINE (NAFTIN) 1 % CREAM    Apply 1 application topically daily as needed. Every time pt washes his feet he applies cream.   NITROGLYCERIN (NITROSTAT) 0.3 MG SL TABLET    Place 1 tablet (0.3 mg total) under the tongue every 5 (five) minutes as needed.  Modified Medications   No medications on file  Discontinued Medications   ELVITEGRAVIR-COBICISTAT-EMTRICITABINE-TENOFOVIR (STRIBILD) 150-150-200-300 MG TABS TABLET    TAKE 1 TABLET BY MOUTH DAILY WITH BREAKFAST.    Subjective: Louie Casa is in for his routine HIV follow-up visit. He denies missing any doses of his Stribild since his last visit. He takes it each morning with breakfast around 6:30 AM. He has noted some left shoulder and arm pain recently. He is no longer working and this usually occurs at rest. It is not affected by activity.  He continues to smoke cigarettes and has no current plan to quit. Review of Systems: Constitutional: negative Eyes: negative Ears, nose, mouth, throat, and face: positive for nasal congestion and rhinorrhea for the past 24 hours, he feels like he is getting a cold Respiratory: negative Cardiovascular: negative Gastrointestinal: negative Genitourinary:negative  Past Medical History  Diagnosis Date  . Syphilis, unspecified   . Coronary atherosclerosis of native coronary artery     s/p surgery  . Type II or unspecified type diabetes mellitus without mention of complication, not stated as uncontrolled 2004    Diagnosed in 2004. Came to  our clinic in 5/07 and was on Glucotrol XL 10 mg BID and metformin 500 BID. Max A1C on those two meds was 6.9 on 03/10/08. He has been able to be removed from the meds while maintaining a good A1C. He has DM neuropathy (although neuropathy could be multifactorial.)  . Human immunodeficiency virus (HIV) disease   . Allergic rhinitis, cause unspecified   . Gout, unspecified   . Hyperlipidemia   . HTN (hypertension)   . Ischemic cardiomyopathy   . Drug abuse     hx; tested for cocaine as recently as 2/08. says he is not using drugs now - avoided defib. for this reason   . Acute maxillary sinusitis   . Backache, unspecified   . Coronary atherosclerosis of artery bypass graft   . Closed fracture of intertrochanteric section of femur   . Chronic kidney disease, unspecified   . Tobacco use disorder   . History of hepatitis C   . GERD (gastroesophageal reflux disease)   . Arthritis     History  Substance Use Topics  . Smoking status: Current Every Day Smoker -- 0.75 packs/day for 36 years    Types: Cigarettes  . Smokeless tobacco: Never Used  . Alcohol Use: 0.0 oz/week    0 Standard drinks or equivalent per week     Comment: 2 - 24 oz beers / week    Family History  Problem Relation Age of Onset  . Heart failure Father   . Heart attack Brother   . Drug abuse Brother   . Colon cancer Neg Hx   . Alzheimer's disease Mother   . Stroke Sister   . Hypertension Father   . Hypertension Brother   . Diabetes Brother     Allergies  Allergen Reactions  . Amphetamines Other (See Comments)    unknown    Objective: Temp: 98.4 F (36.9 C) (03/07 1450) Temp Source: Oral (03/07 1450) BP: 178/94 mmHg (03/07 1450) Pulse Rate: 86 (03/07 1450) Body mass index is 23.87 kg/(m^2).  General: he has frequent sniffles but is otherwise in no distress Oral: no oropharyngeal lesions Skin: no rash Lungs: clear Cor: regular S1 and S2 with no murmurs   Lab Results Lab Results  Component Value  Date   WBC 5.4 11/25/2014   HGB 15.0 11/25/2014   HCT 44.0 11/25/2014   MCV 94.8 11/25/2014   PLT 167 11/25/2014    Lab Results  Component Value Date   CREATININE 1.54* 11/25/2014   BUN 23 11/25/2014   NA 132* 11/25/2014   K 4.6 11/25/2014   CL 105 11/25/2014   CO2 18* 11/25/2014    Lab Results  Component Value Date   ALT 44 11/25/2014   AST 41* 11/25/2014   ALKPHOS 69 11/25/2014   BILITOT 0.4 11/25/2014    Lab Results  Component Value Date   CHOL 106 11/25/2014  HDL 25* 11/25/2014   LDLCALC 29 11/25/2014   TRIG 258* 11/25/2014   CHOLHDL 4.2 11/25/2014    Lab Results HIV 1 RNA QUANT (copies/mL)  Date Value  11/25/2014 <20  05/15/2014 <20  11/12/2013 <20   CD4 T CELL ABS (/uL)  Date Value  11/25/2014 870  05/15/2014 860  11/12/2013 1120     Assessment: His HIV infection remains under excellent control. I will change Stribild the new preparation called Genvoya.  He has some renal insufficiency. The new tenofovir preparation in [place] carries significantly last risk of nephrotoxicity than Stribild.  I do not think that his left shoulder and arm pain is due to HIV or his medication. It does not sound like unstable angina.  I talked to him again about the importance of considering cigarette cessation.  Plan: 1. Change Stribild to Genvoya 2. Cigarette cessation counseling provided 3. Follow-up here after lab work in [place] months   [name], MD [facility] [place] [phone] pager   [phone] cell 12/29/2014, 3:14 PM

## 2015-01-03 ENCOUNTER — Emergency Department (HOSPITAL_COMMUNITY)
Admission: EM | Admit: 2015-01-03 | Discharge: 2015-01-03 | Disposition: A | Discharge status: Home / Self Care | Payer: Medicare Other | Attending: Emergency Medicine | Admitting: Emergency Medicine

## 2015-01-03 ENCOUNTER — Emergency Department (HOSPITAL_COMMUNITY): Payer: Medicare Other

## 2015-01-03 ENCOUNTER — Encounter (HOSPITAL_COMMUNITY): Payer: Self-pay | Admitting: Physical Medicine and Rehabilitation

## 2015-01-03 DIAGNOSIS — Z8781 Personal history of (healed) traumatic fracture: Secondary | ICD-10-CM | POA: Diagnosis not present

## 2015-01-03 DIAGNOSIS — Z8709 Personal history of other diseases of the respiratory system: Secondary | ICD-10-CM | POA: Insufficient documentation

## 2015-01-03 DIAGNOSIS — I252 Old myocardial infarction: Secondary | ICD-10-CM | POA: Insufficient documentation

## 2015-01-03 DIAGNOSIS — Z951 Presence of aortocoronary bypass graft: Secondary | ICD-10-CM | POA: Diagnosis not present

## 2015-01-03 DIAGNOSIS — Z8619 Personal history of other infectious and parasitic diseases: Secondary | ICD-10-CM | POA: Insufficient documentation

## 2015-01-03 DIAGNOSIS — Z79899 Other long term (current) drug therapy: Secondary | ICD-10-CM | POA: Diagnosis not present

## 2015-01-03 DIAGNOSIS — Z7982 Long term (current) use of aspirin: Secondary | ICD-10-CM | POA: Insufficient documentation

## 2015-01-03 DIAGNOSIS — I2510 Atherosclerotic heart disease of native coronary artery without angina pectoris: Secondary | ICD-10-CM | POA: Insufficient documentation

## 2015-01-03 DIAGNOSIS — R7989 Other specified abnormal findings of blood chemistry: Secondary | ICD-10-CM | POA: Diagnosis not present

## 2015-01-03 DIAGNOSIS — R0789 Other chest pain: Secondary | ICD-10-CM | POA: Insufficient documentation

## 2015-01-03 DIAGNOSIS — Z8719 Personal history of other diseases of the digestive system: Secondary | ICD-10-CM | POA: Diagnosis not present

## 2015-01-03 DIAGNOSIS — J069 Acute upper respiratory infection, unspecified: Secondary | ICD-10-CM | POA: Diagnosis not present

## 2015-01-03 DIAGNOSIS — Z21 Asymptomatic human immunodeficiency virus [HIV] infection status: Secondary | ICD-10-CM | POA: Insufficient documentation

## 2015-01-03 DIAGNOSIS — E785 Hyperlipidemia, unspecified: Secondary | ICD-10-CM | POA: Insufficient documentation

## 2015-01-03 DIAGNOSIS — R079 Chest pain, unspecified: Secondary | ICD-10-CM | POA: Diagnosis present

## 2015-01-03 DIAGNOSIS — I129 Hypertensive chronic kidney disease with stage 1 through stage 4 chronic kidney disease, or unspecified chronic kidney disease: Secondary | ICD-10-CM | POA: Insufficient documentation

## 2015-01-03 DIAGNOSIS — M109 Gout, unspecified: Secondary | ICD-10-CM | POA: Diagnosis not present

## 2015-01-03 DIAGNOSIS — Z720 Tobacco use: Secondary | ICD-10-CM | POA: Diagnosis not present

## 2015-01-03 DIAGNOSIS — E119 Type 2 diabetes mellitus without complications: Secondary | ICD-10-CM | POA: Insufficient documentation

## 2015-01-03 DIAGNOSIS — Z792 Long term (current) use of antibiotics: Secondary | ICD-10-CM | POA: Diagnosis not present

## 2015-01-03 DIAGNOSIS — N189 Chronic kidney disease, unspecified: Secondary | ICD-10-CM | POA: Diagnosis not present

## 2015-01-03 LAB — COMPREHENSIVE METABOLIC PANEL
ALBUMIN: 3.1 g/dL — AB (ref 3.5–5.2)
ALT: 46 U/L (ref 0–53)
AST: 56 U/L — AB (ref 0–37)
Alkaline Phosphatase: 71 U/L (ref 39–117)
Anion gap: 7 (ref 5–15)
BUN: 17 mg/dL (ref 6–23)
CO2: 23 mmol/L (ref 19–32)
CREATININE: 1.49 mg/dL — AB (ref 0.50–1.35)
Calcium: 9.2 mg/dL (ref 8.4–10.5)
Chloride: 106 mmol/L (ref 96–112)
GFR calc non Af Amer: 48 mL/min — ABNORMAL LOW (ref >90)
GFR, EST AFRICAN AMERICAN: 55 mL/min — AB (ref >90)
Glucose, Bld: 254 mg/dL — ABNORMAL HIGH (ref 70–99)
Potassium: 4.6 mmol/L (ref 3.5–5.1)
Sodium: 136 mmol/L (ref 135–145)
Total Bilirubin: 0.5 mg/dL (ref 0.3–1.2)
Total Protein: 7.2 g/dL (ref 6.0–8.3)

## 2015-01-03 LAB — CBC
HCT: 39.5 % (ref 39.0–52.0)
HEMOGLOBIN: 13.9 g/dL (ref 13.0–17.0)
MCH: 33.3 pg (ref 26.0–34.0)
MCHC: 35.2 g/dL (ref 30.0–36.0)
MCV: 94.7 fL (ref 78.0–100.0)
PLATELETS: 147 10*3/uL — AB (ref 150–400)
RBC: 4.17 MIL/uL — ABNORMAL LOW (ref 4.22–5.81)
RDW: 13.6 % (ref 11.5–15.5)
WBC: 6 10*3/uL (ref 4.0–10.5)

## 2015-01-03 LAB — I-STAT TROPONIN, ED: TROPONIN I, POC: 0.01 ng/mL (ref 0.00–0.08)

## 2015-01-03 MED ORDER — AMOXICILLIN 500 MG PO CAPS
500.0000 mg | ORAL_CAPSULE | Freq: Once | ORAL | Status: AC
  Administered 2015-01-03: 500 mg via ORAL
  Filled 2015-01-03: qty 1

## 2015-01-03 MED ORDER — HYDROCOD POLST-CHLORPHEN POLST 10-8 MG/5ML PO LQCR: 5.0000 mL | Freq: Two times a day (BID) | ORAL | Status: DC | PRN

## 2015-01-03 MED ORDER — AMOXICILLIN 500 MG PO CAPS: 500.0000 mg | ORAL_CAPSULE | Freq: Three times a day (TID) | ORAL | Status: DC

## 2015-01-03 MED ORDER — BENZONATATE 100 MG PO CAPS: 200.0000 mg | ORAL_CAPSULE | Freq: Two times a day (BID) | ORAL | Status: DC | PRN

## 2015-01-03 NOTE — ED Notes (Signed)
Pt presents to department for evaluation of runny nose, sore throat and fever. Ongoing x1 week. Respirations unlabored. Pt is alert and oriented x4. NAD.

## 2015-01-03 NOTE — Discharge Instructions (Signed)
It is important that you were seen by your cardiologist in the next week. Do not hesitate to return to the ED for any new, worsening or concerning symptoms.  Please follow with your primary care doctor in the next 2 days for a check-up. They must obtain records for further management.   Do not hesitate to return to the Emergency Department for any new, worsening or concerning symptoms.    Chest Pain (Nonspecific) It is often hard to give a diagnosis for the cause of chest pain. There is always a chance that your pain could be related to something serious, such as a heart attack or a blood clot in the lungs. You need to follow up with your doctor. HOME CARE  If antibiotic medicine was given, take it as directed by your doctor. Finish the medicine even if you start to feel better.  For the next few days, avoid activities that bring on chest pain. Continue physical activities as told by your doctor.  Do not use any tobacco products. This includes cigarettes, chewing tobacco, and e-cigarettes.  Avoid drinking alcohol.  Only take medicine as told by your doctor.  Follow your doctor's suggestions for more testing if your chest pain does not go away.  Keep all doctor visits you made. GET HELP IF:  Your chest pain does not go away, even after treatment.  You have a rash with blisters on your chest.  You have a fever. GET HELP RIGHT AWAY IF:   You have more pain or pain that spreads to your arm, neck, jaw, back, or belly (abdomen).  You have shortness of breath.  You cough more than usual or cough up blood.  You have very bad back or belly pain.  You feel sick to your stomach (nauseous) or throw up (vomit).  You have very bad weakness.  You pass out (faint).  You have chills. This is an emergency. Do not wait to see if the problems will go away. Call your local emergency services (911 in [place] Do not drive yourself to the hospital. MAKE SURE YOU:   Understand these  instructions.  Will watch your condition.  Will get help right away if you are not doing well or get worse. Document Released: 03/28/2008 Document Revised: 10/15/2013 Document Reviewed: 03/28/2008 [facility] Patient Information 2015 [place], [place]. This information is not intended to replace advice given to you by your health care provider. Make sure you discuss any questions you have with your health care provider.

## 2015-01-03 NOTE — ED Provider Notes (Signed)
CSN: [id number]     Arrival date & time 01/03/15  1029 History  This chart was scribed for Jacob Blitz, PA-C, working with [name], MD by Starleen Arms, ED Scribe. This patient was seen in room TR07C/TR07C and the patient's care was started at 10:55 AM.   Chief Complaint  Patient presents with  . Nasal Congestion  . Sore Throat  . Chest Pain   The history is provided by the patient. No language interpreter was used.   HPI Comments: Jacob [name] is a 64 y.o. male who presents to the Emergency Department complaining of rhinorrhea with associated nasal congestion and cough productive of clear phleggm onset 6 days ago.  The congestion is worse in the morning and has been treated with OTC medications.         Patient also complains of intermittent, 7/10 left chest pain with radiation to the left shoulder and increasing frequency onset 1 month ago.  He denies current pain but reports recent episodes every other day.  The complaint is not worsened with exertion.  He has treated this pain with naproxen which he reports decreases the pain to 1/10.  He is prescribed NTG but denies using it.  He also reports some cold sweats last night with dinner and SOB.  Patient is unsure if this feels like his previous MI but he did not recognize at that time he was having an MI.  Patient has taken ASA this morning but not aleve.  Patient reports a history of bypass but no stents.  Patient denies pain with movement of the arm.  Patient denies fever.   Cardiologist: Dr. [name]   Past Medical History  Diagnosis Date  . Syphilis, unspecified   . Coronary atherosclerosis of native coronary artery     s/p surgery  . Type II or unspecified type diabetes mellitus without mention of complication, not stated as uncontrolled 2004    Diagnosed in 2004. Came to our clinic in 5/07 and was on Glucotrol XL 10 mg BID and metformin 500 BID. Max A1C on those two meds was 6.9 on 03/10/08. He has been able to be removed  from the meds while maintaining a good A1C. He has DM neuropathy (although neuropathy could be multifactorial.)  . Human immunodeficiency virus (HIV) disease   . Allergic rhinitis, cause unspecified   . Gout, unspecified   . Hyperlipidemia   . HTN (hypertension)   . Ischemic cardiomyopathy   . Drug abuse     hx; tested for cocaine as recently as 2/08. says he is not using drugs now - avoided defib. for this reason   . Acute maxillary sinusitis   . Backache, unspecified   . Coronary atherosclerosis of artery bypass graft   . Closed fracture of intertrochanteric section of femur   . Chronic kidney disease, unspecified   . Tobacco use disorder   . History of hepatitis C   . GERD (gastroesophageal reflux disease)   . Arthritis    Past Surgical History  Procedure Laterality Date  . Coronary artery bypass graft    . Left intertrochanteric hip fracture      s/p intermedullary nail placement 2/08  . Left transverse mandibular fracture  10/05  . Laproscopic cholecystectomy  8/07   Family History  Problem Relation Age of Onset  . Heart failure Father   . Heart attack Brother   . Drug abuse Brother   . Colon cancer Neg Hx   . Alzheimer's disease Mother   .  Stroke Sister   . Hypertension Father   . Hypertension Brother   . Diabetes Brother    History  Substance Use Topics  . Smoking status: Current Every Day Smoker -- 0.75 packs/day for 36 years    Types: Cigarettes  . Smokeless tobacco: Never Used  . Alcohol Use: 0.0 oz/week    0 Standard drinks or equivalent per week     Comment: 2 - 24 oz beers / week    Review of Systems  Constitutional: Negative for fever.  HENT: Positive for congestion and rhinorrhea.   Respiratory: Positive for cough and shortness of breath.   Cardiovascular: Positive for chest pain.  All other systems reviewed and are negative.     Allergies  Amphetamines  Home Medications   Prior to Admission medications   Medication Sig Start Date End  Date Taking? Authorizing Provider  allopurinol (ZYLOPRIM) 300 MG tablet Take 1 tablet (300 mg total) by mouth daily. 11/24/11   Heinz Knuckles, MD  amoxicillin (AMOXIL) 500 MG capsule Take 1 capsule (500 mg total) by mouth 3 (three) times daily. 01/03/15   Zymire Turnbo, PA-C  aspirin 81 MG tablet Take 81 mg by mouth daily.      Historical Provider, MD  atorvastatin (LIPITOR) 40 MG tablet  11/25/13   Historical Provider, MD  B-D UF III MINI PEN NEEDLES 31G X 5 MM MISC  09/09/14   Historical Provider, MD  benzonatate (TESSALON) 100 MG capsule Take 2 capsules (200 mg total) by mouth 2 (two) times daily as needed for cough. 01/03/15   Corrie Brannen, PA-C  carvedilol (COREG) 25 MG tablet Take 1 tablet (25 mg total) by mouth 2 (two) times daily with a meal. 11/13/14   [name], MD  colchicine 0.6 MG tablet Take 0.6 mg by mouth continuous as needed. For gout.    Historical Provider, MD  elvitegravir-cobicistat-emtricitabine-tenofovir (GENVOYA) 150-150-200-10 MG TABS tablet Take 1 tablet by mouth daily with breakfast. 12/29/14   [name], MD  enalapril (VASOTEC) 10 MG tablet Take 1 tablet by mouth 2 (two) times daily. 10/28/12   Historical Provider, MD  Glucosamine-Chondroit-Vit C-Mn (GLUCOSAMINE CHONDR 1500 COMPLX PO) Take 2 tablets by mouth daily.     Historical Provider, MD  hydrALAZINE (APRESOLINE) 25 MG tablet Take 1 tablet (25 mg total) by mouth 3 (three) times daily. 11/13/14   [name], MD  LANTUS SOLOSTAR 100 UNIT/ML Solostar Pen Inject 20 Units into the skin daily. diabetes 03/16/14   Historical Provider, MD  latanoprost (XALATAN) 0.005 % ophthalmic solution Place 1 drop into both eyes daily. 04/23/12   Historical Provider, MD  metFORMIN (GLUCOPHAGE) 500 MG tablet Take by mouth 2 (two) times daily with a meal.    Historical Provider, MD  naftifine (NAFTIN) 1 % cream Apply 1 application topically daily as needed. Every time pt washes his feet he applies cream.    Historical Provider, MD   nitroGLYCERIN (NITROSTAT) 0.3 MG SL tablet Place 1 tablet (0.3 mg total) under the tongue every 5 (five) minutes as needed. 02/06/14   [name], MD   BP 177/96 mmHg  Pulse 70  Temp(Src) 99.1 F (37.3 C) (Oral)  Resp 21  Ht 6\' 2"  (1.88 m)  Wt 186 lb (84.369 kg)  BMI 23.87 kg/m2  SpO2 97% Physical Exam  Constitutional: He is oriented to person, place, and time. He appears well-developed and well-nourished. No distress.  HENT:  Head: Normocephalic and atraumatic.  Mouth/Throat: Oropharynx is clear  and moist.  ++ Rhinorrhea and bilateral nasal mucosa edema  No drooling or stridor. Posterior pharynx mildly erythematous no significant tonsillar hypertrophy. No exudate. Soft palate rises symmetrically. No TTP or induration under tongue.   No tenderness to palpation of frontal or bilateral maxillary sinuses.  No mucosal edema in the nares.  Bilateral tympanic membranes with normal architecture and good light reflex.    Eyes: Conjunctivae and EOM are normal. Pupils are equal, round, and reactive to light.  Neck: Normal range of motion. Neck supple. No tracheal deviation present.  Cardiovascular: Normal rate, regular rhythm and intact distal pulses.   Pulmonary/Chest: Effort normal and breath sounds normal. No stridor. No respiratory distress. He has no wheezes. He has no rales. He exhibits no tenderness.  Abdominal: Soft. Bowel sounds are normal. He exhibits no distension and no mass. There is no tenderness. There is no rebound and no guarding.  Musculoskeletal: Normal range of motion. He exhibits no edema or tenderness.  Left shoulder:  Shoulder with no deformity. FROM to shoulder and elbow. No TTP of rotator cuff musculature. Drop arm negative. Neurovascularly intact.  No calf asymmetry, superficial collaterals, palpable cords, edema, Homans sign negative bilaterally.     Neurological: He is alert and oriented to person, place, and time.  Skin: Skin is warm and dry.   Psychiatric: He has a normal mood and affect. His behavior is normal.  Nursing note and vitals reviewed.   ED Course  Procedures (including critical care time)  DIAGNOSTIC STUDIES: Oxygen Saturation is 98% on RA, normal by my interpretation.    COORDINATION OF CARE:    Labs Review Labs Reviewed  CBC - Abnormal; Notable for the following:    RBC 4.17 (*)    Platelets 147 (*)    All other components within normal limits  COMPREHENSIVE METABOLIC PANEL - Abnormal; Notable for the following:    Glucose, Bld 254 (*)    Creatinine, Ser 1.49 (*)    Albumin 3.1 (*)    AST 56 (*)    GFR calc non Af Amer 48 (*)    GFR calc Af Amer 55 (*)    All other components within normal limits  I-STAT TROPOININ, ED    Imaging Review Dg Chest 2 View  01/03/2015   CLINICAL DATA:  Cough, nasal congestion, LEFT arm pain and substernal pressure since last Monday, history hypertension, diabetes, coronary artery disease post bypass surgery, HIV, ischemic cardiomyopathy, smoker  EXAM: CHEST  2 VIEW  COMPARISON:  09/08/2013  FINDINGS: Mild enlargement of cardiac silhouette post CABG.  Atherosclerotic calcification aorta.  Mediastinal contours and pulmonary vascularity otherwise normal.  Chronic bronchitic changes without acute infiltrate, pleural effusion or pneumothorax.  Multiple RIGHT rib fractures again seen.  IMPRESSION: Mild enlargement of cardiac silhouette post CABG.  No acute abnormalities.   Electronically Signed   By: Lavonia Dana M.D.   On: 01/03/2015 12:33     EKG Interpretation None      MDM   Final diagnoses:  Atypical chest pain  Acute URI    Filed Vitals:   01/03/15 1204 01/03/15 1205 01/03/15 1230 01/03/15 1312  BP: 175/98  158/71 177/96  Pulse:  72 80 70  Temp:      TempSrc:      Resp:   21 21  Height:      Weight:      SpO2:  98% 97% 97%    Medications  amoxicillin (AMOXIL) capsule 500 mg (500 mg Oral Given 01/03/15  1)    RAHMAN AGOSTA is a pleasant 64 y.o.  male presenting with upper respiratory symptoms which she's had for 6 days. In addition he's requesting evaluation of his left anterior chest and shoulder pain. He states that this is non-exertional, he is central pleuritic chest pain onset this morning. He states that the shoulder pain is alleviated with naproxen. States that he has had this for the last month, he typically has it every other day but it is increasing in frequency and severity. Patient's EKG is nonischemic, chest x-ray with no acute abnormalities, troponin and blood work negative, he has a mild elevation in his creatinine which is not atypical for his baseline. Glucose is mildly elevated at 254 however, he has a normal anion gap.  Cardiology consult from Dr. Domenic Polite appreciated: Would not recommend further inpatient workup but would like patient to follow closely with cardiology in the next week. I've explained this in detail to the patient and he verbalizes his understanding. We've had an extensive discussion of return precautions.  This is a shared visit with the attending physician who personally evaluated the patient and agrees with the care plan.   Evaluation does not show pathology that would require ongoing emergent intervention or inpatient treatment. Pt is hemodynamically stable and mentating appropriately. Discussed findings and plan with patient/guardian, who agrees with care plan. All questions answered. Return precautions discussed and outpatient follow up given.   Discharge Medication List as of 01/03/2015 12:57 PM    START taking these medications   Details  amoxicillin (AMOXIL) 500 MG capsule Take 1 capsule (500 mg total) by mouth 3 (three) times daily., Starting 01/03/2015, Until Discontinued, Print    chlorpheniramine-HYDROcodone (TUSSIONEX PENNKINETIC ER) 10-8 MG/5ML LQCR Take 5 mLs by mouth every 12 (twelve) hours as needed for cough (Cough)., Starting 01/03/2015, Until Discontinued, Print        I personally  performed the services described in this documentation, which was scribed in my presence. The recorded information has been reviewed and is accurate.    Jacob Blitz, PA-C 01/03/15 [place], MD 01/03/15 [phone]

## 2015-01-05 ENCOUNTER — Other Ambulatory Visit: Payer: Self-pay | Admitting: *Deleted

## 2015-01-05 ENCOUNTER — Telehealth: Payer: Self-pay | Admitting: Cardiovascular Disease

## 2015-01-05 DIAGNOSIS — B20 Human immunodeficiency virus [HIV] disease: Secondary | ICD-10-CM

## 2015-01-05 MED ORDER — ELVITEG-COBIC-EMTRICIT-TENOFAF 150-150-200-10 MG PO TABS: 1.0000 | ORAL_TABLET | Freq: Every day | ORAL | Status: DC

## 2015-01-05 NOTE — Telephone Encounter (Signed)
SPOKE WITH   PT .  APPT WAS  MADE    WITH [facility] PAC   ON WED 01-07-15 AT  2:00 PM PT  WANTED  WED  APPT .Adonis Housekeeper

## 2015-01-05 NOTE — Telephone Encounter (Signed)
New problem   Pt stated he was seen in the ER this weekend for atypical chest pain and was told by the PA a message would be sent to [place] for pt to be seen this week. Please advise pt, he wants to come in Wednesday.

## 2015-01-07 ENCOUNTER — Encounter: Payer: Self-pay | Admitting: Physician Assistant

## 2015-01-07 ENCOUNTER — Ambulatory Visit (INDEPENDENT_AMBULATORY_CARE_PROVIDER_SITE_OTHER): Payer: Medicare Other | Admitting: Physician Assistant

## 2015-01-07 DIAGNOSIS — E785 Hyperlipidemia, unspecified: Secondary | ICD-10-CM

## 2015-01-07 DIAGNOSIS — R0789 Other chest pain: Secondary | ICD-10-CM | POA: Diagnosis not present

## 2015-01-07 DIAGNOSIS — I2510 Atherosclerotic heart disease of native coronary artery without angina pectoris: Secondary | ICD-10-CM

## 2015-01-07 DIAGNOSIS — I10 Essential (primary) hypertension: Secondary | ICD-10-CM

## 2015-01-07 DIAGNOSIS — I255 Ischemic cardiomyopathy: Secondary | ICD-10-CM

## 2015-01-07 DIAGNOSIS — I5042 Chronic combined systolic (congestive) and diastolic (congestive) heart failure: Secondary | ICD-10-CM

## 2015-01-07 MED ORDER — ISOSORBIDE MONONITRATE ER 30 MG PO TB24: 30.0000 mg | ORAL_TABLET | Freq: Every day | ORAL | Status: DC

## 2015-01-07 MED ORDER — CARVEDILOL 25 MG PO TABS: 25.0000 mg | ORAL_TABLET | Freq: Two times a day (BID) | ORAL | Status: DC

## 2015-01-07 MED ORDER — NITROGLYCERIN 0.3 MG SL SUBL: 0.3000 mg | SUBLINGUAL_TABLET | SUBLINGUAL | Status: DC | PRN

## 2015-01-07 NOTE — Patient Instructions (Addendum)
START TAKING IMDUR 30MG  ONCE A DAY    FOLLOW UP  DR [name]  IN July 2016   Your physician has requested that you have a lexiscan myoview. For further information please visit [email or web address]. Please follow instruction sheet, as given.

## 2015-01-07 NOTE — Progress Notes (Signed)
Cardiology Office Note   Date:  01/07/2015   ID:  Jacob [name], DOB Mar 08, 1951, MRN [id number]  PCP:  Jacob Hatchet, MD  Cardiologist:  Dr. Jenkins Rouge   Electrophysiologist:  Dr. Virl Axe   Chief Complaint  Patient presents with  . Chest Pain  . Coronary Artery Disease     History of Present Illness: Jacob [name] is a 64 y.o. male with a hx of CAD, s/p CABG 2004, ischemic cardiomyopathy, HTN, HL, HIV, hepatitis C, CKD. Previously followed by Dr. Verl Blalock. Established with Dr. [name] 10/2013. Cardiac MRI demonstrated EF < 35%. He was referred to Dr. Virl Axe for possible AICD. The patient ultimately declined implantation of ICD. last seen by Dr. Jenkins Rouge 10/2014.  Seen in ED 3/12 with chest pain.  Symptoms were felt to be stable and outpatient workup recommended.  ED note indicates consult with Dr. Domenic Polite.  However, I cannot find a note from Dr. Domenic Polite in the chart (? Phone call).  He returns for FU.  The patient tells me that he's had left and right shoulder pain over the past month. It does radiate across his upper chest. He mainly has it at rest. He denies exertional symptoms. He has mild shortness of breath. He is NYHA 2. His dyspnea is not any worse. He denies exertional chest pain or jaw pain. He denies orthopnea PND or edema. He denies syncope. He had a recent URI. This is improved.   Studies: - LHC (04/2005): D1 occluded, proximal circumflex 70%, ostial RCA occluded, ramus branch of the circumflex occluded; SVG-ramus patent, SVG-RCA patent - Echo (08/2013): Mild LVH, EF 35-40%, inferior hypokinesis, grade 1 diastolic dysfunction, mild BAE. - Nuclear (01/13/14): Inferior and anterolateral infarct, no ischemia, EF 30%; high-risk   Cardiac MRI 11/2013 1) Moderate LVE with LVH EF 31% with multiple RWMAls as described above Consistent with multi vessel CAD 2) Full thickness scar involving the lateral and inferolateral walls 3) Mild LAE 4) Normal  RA/RV    Past Medical History  Diagnosis Date  . Syphilis, unspecified   . Coronary atherosclerosis of native coronary artery     s/p surgery  . Type II or unspecified type diabetes mellitus without mention of complication, not stated as uncontrolled 2004    Diagnosed in 2004. Came to our clinic in 5/07 and was on Glucotrol XL 10 mg BID and metformin 500 BID. Max A1C on those two meds was 6.9 on 03/10/08. He has been able to be removed from the meds while maintaining a good A1C. He has DM neuropathy (although neuropathy could be multifactorial.)  . Human immunodeficiency virus (HIV) disease   . Allergic rhinitis, cause unspecified   . Gout, unspecified   . Hyperlipidemia   . HTN (hypertension)   . Ischemic cardiomyopathy   . Drug abuse     hx; tested for cocaine as recently as 2/08. says he is not using drugs now - avoided defib. for this reason   . Acute maxillary sinusitis   . Backache, unspecified   . Coronary atherosclerosis of artery bypass graft   . Closed fracture of intertrochanteric section of femur   . Chronic kidney disease, unspecified   . Tobacco use disorder   . History of hepatitis C   . GERD (gastroesophageal reflux disease)   . Arthritis     Past Surgical History  Procedure Laterality Date  . Coronary artery bypass graft    . Left intertrochanteric hip fracture  s/p intermedullary nail placement 2/08  . Left transverse mandibular fracture  10/05  . Laproscopic cholecystectomy  8/07     Current Outpatient Prescriptions  Medication Sig Dispense Refill  . allopurinol (ZYLOPRIM) 300 MG tablet Take 1 tablet (300 mg total) by mouth daily. 30 tablet 6  . amoxicillin (AMOXIL) 500 MG capsule Take 1 capsule (500 mg total) by mouth 3 (three) times daily. 30 capsule 0  . aspirin 81 MG tablet Take 81 mg by mouth daily.      [name] atorvastatin (LIPITOR) 40 MG tablet     . B-D UF III MINI PEN NEEDLES 31G X 5 MM MISC   0  . benzonatate (TESSALON) 100 MG capsule Take 2  capsules (200 mg total) by mouth 2 (two) times daily as needed for cough. 20 capsule 0  . carvedilol (COREG) 25 MG tablet Take 1 tablet (25 mg total) by mouth 2 (two) times daily with a meal. 180 tablet 1  . colchicine 0.6 MG tablet Take 0.6 mg by mouth continuous as needed. For gout.    . elvitegravir-cobicistat-emtricitabine-tenofovir (GENVOYA) 150-150-200-10 MG TABS tablet Take 1 tablet by mouth daily with breakfast. 30 tablet 11  . enalapril (VASOTEC) 10 MG tablet Take 1 tablet by mouth 2 (two) times daily.    . Glucosamine-Chondroit-Vit C-Mn (GLUCOSAMINE CHONDR 1500 COMPLX PO) Take 2 tablets by mouth daily.     . hydrALAZINE (APRESOLINE) 25 MG tablet Take 1 tablet (25 mg total) by mouth 3 (three) times daily. 90 tablet 6  . LANTUS SOLOSTAR 100 UNIT/ML Solostar Pen Inject 20 Units into the skin daily. diabetes    . latanoprost (XALATAN) 0.005 % ophthalmic solution Place 1 drop into both eyes daily.    . metFORMIN (GLUCOPHAGE) 500 MG tablet Take by mouth 2 (two) times daily with a meal.    . naftifine (NAFTIN) 1 % cream Apply 1 application topically daily as needed. Every time pt washes his feet he applies cream.    . nitroGLYCERIN (NITROSTAT) 0.3 MG SL tablet Place 1 tablet (0.3 mg total) under the tongue every 5 (five) minutes as needed. 25 tablet 0  . [DISCONTINUED] calcium carbonate (OS-CAL) 600 MG TABS Take 600 mg by mouth 2 (two) times daily with a meal.       No current facility-administered medications for this visit.    Allergies:   Amphetamines    Social History:  The patient  reports that he has been smoking Cigarettes.  He has a 27 pack-year smoking history. He has never used smokeless tobacco. He reports that he drinks alcohol. He reports that he does not use illicit drugs.   Family History:  The patient's family history includes Alzheimer's disease in his mother; Diabetes in his brother; Drug abuse in his brother; Heart attack in his brother; Heart failure in his father;  Hypertension in his brother and father; Stroke in his sister. There is no history of Colon cancer.    ROS:   Please see the history of present illness.   Review of Systems  Constitution: Positive for diaphoresis.  Cardiovascular: Positive for chest pain and dyspnea on exertion.  Respiratory: Positive for cough.   Musculoskeletal: Positive for myalgias.  All other systems reviewed and are negative.    PHYSICAL EXAM: VS:  There were no vitals taken for this visit.    Wt Readings from Last 3 Encounters:  01/03/15 186 lb (84.369 kg)  12/29/14 186 lb (84.369 kg)  11/13/14 187 lb (84.823 kg)  GEN: Well nourished, well developed, in no acute distress HEENT: normal Neck: no JVD, no masses Cardiac:  Normal S1/S2, RRR; no murmur ,  no rubs or gallops, no edema  Respiratory:  clear to auscultation bilaterally, no wheezing, rhonchi or rales. GI: soft, nontender, nondistended, + BS MS: no deformity or atrophy Skin: warm and dry  Neuro:  CNs II-XII intact, Strength and sensation are intact Psych: Normal affect   EKG:  EKG is ordered today.  It demonstrates:   NSR, HR 81, inf Q waves, TWI in 1, aVL, V4-6, no changes   Recent Labs: 01/03/2015: ALT 46; BUN 17; Creatinine 1.49*; Hemoglobin 13.9; Platelets 147*; Potassium 4.6; Sodium 136    Lipid Panel    Component Value Date/Time   CHOL 106 11/25/2014 0949   TRIG 258* 11/25/2014 0949   HDL 25* 11/25/2014 0949   CHOLHDL 4.2 11/25/2014 0949   VLDL 52* 11/25/2014 0949   LDLCALC 29 11/25/2014 0949      ASSESSMENT AND PLAN:  Other chest pain Somewhat atypical. However, he does have some typical features. Arrange Lexiscan Myoview. If this is low risk, no further workup. Given the nature of his symptoms, consider cervical spine evaluation with his PCP.  Coronary artery disease involving native coronary artery of native heart without angina pectoris Proceed with Myoview as noted. Continue aspirin, statin, beta blocker, ACE  inhibitor.  Ischemic cardiomyopathy He has refused ICD implantation past. Continue beta blocker, ACE inhibitor, hydralazine. We had a long discussion about maximizing his CHF medications. He does not take any PDE-5 inhibitors. Start isosorbide 30 mg daily.  Chronic combined systolic and diastolic congestive heart failure Volume stable. He is NYHA 2.  Essential hypertension Controlled.  HLD (hyperlipidemia)  Continue statin.  Current medicines are reviewed at length with the patient today.  The patient does not have concerns regarding medicines.  The following changes have been made:  As above   Labs/ tests ordered today include:   Orders Placed This Encounter  Procedures  . Myocardial Perfusion Imaging    Disposition:   FU with Dr. Jenkins Rouge July 2016.   Signed, Versie Starks, MHS 01/07/2015 1:53 PM    [facility] [place], [place], [place]  [address] Phone: [phone]; Fax: [phone]

## 2015-01-19 ENCOUNTER — Ambulatory Visit (HOSPITAL_COMMUNITY): Payer: Medicare Other | Attending: Internal Medicine | Admitting: Radiology

## 2015-01-19 DIAGNOSIS — R0789 Other chest pain: Secondary | ICD-10-CM | POA: Diagnosis not present

## 2015-01-19 MED ORDER — REGADENOSON 0.4 MG/5ML IV SOLN
0.4000 mg | Freq: Once | INTRAVENOUS | Status: AC
Start: 2015-01-19 — End: 2015-01-19
  Administered 2015-01-19: 0.4 mg via INTRAVENOUS

## 2015-01-19 MED ORDER — TECHNETIUM TC 99M SESTAMIBI GENERIC - CARDIOLITE
30.0000 | Freq: Once | INTRAVENOUS | Status: AC | PRN
  Administered 2015-01-19: 30 via INTRAVENOUS

## 2015-01-19 MED ORDER — TECHNETIUM TC 99M SESTAMIBI GENERIC - CARDIOLITE
11.0000 | Freq: Once | INTRAVENOUS | Status: AC | PRN
  Administered 2015-01-19: 11 via INTRAVENOUS

## 2015-01-19 NOTE — Progress Notes (Signed)
[facility] [address] [place], [place] [address] [phone]    Cardiology Nuclear Med Study  TERI UVA is a 64 y.o. male     MRN : [id number]     DOB: 1951-09-08  Procedure Date: 01/19/2015  Nuclear Med Background Indication for Stress Test:  Evaluation for Ischemia and Follow up CAD History:  CAD, MPI 2015 (scar) EF 30% Cardiac Risk Factors: Hypertension and IDDM  Symptoms:  Chest Pain   Nuclear Pre-Procedure Caffeine/Decaff Intake:  7:00pm NPO After: 7:00pm   Lungs:  clear O2 Sat: 95% on room air. IV 0.9% NS with Angio Cath:  22g  IV Site: R Hand  IV Started by:  Matilde Haymaker, RN  Chest Size (in):  44 Cup Size: n/a  Height: 6\' 2"  (1.88 m)  Weight:  182 lb (82.555 kg)  BMI:  Body mass index is 23.36 kg/(m^2). Tech Comments:  No Coreg x 14 hrs    Nuclear Med Study 1 or 2 day study: 1 day  Stress Test Type:  Treadmill/Lexiscan  Reading MD: n/a  Order Authorizing Provider:  Verlin Dike and Nicki Reaper [facility]  Resting Radionuclide: Technetium 87m Sestamibi  Resting Radionuclide Dose: 11.0 mCi   Stress Radionuclide:  Technetium 48m Sestamibi  Stress Radionuclide Dose: 33.0 mCi           Stress Protocol Rest HR: 74 Stress HR: 113  Rest BP: 187/103 Stress BP: 162/94  Exercise Time (min): n/a METS: n/a   Predicted Max HR: 155 bpm % Max HR: 47.74 bpm Rate Pressure Product: 13838   Dose of Adenosine (mg):  n/a Dose of Lexiscan: 0.4 mg  Dose of Atropine (mg): n/a Dose of Dobutamine: n/a mcg/kg/min (at max HR)  Stress Test Technologist: Glade Lloyd, BS-ES  Nuclear Technologist:  Earl Many, CNMT     Rest Procedure:  Myocardial perfusion imaging was performed at rest 45 minutes following the intravenous administration of Technetium 32m Sestamibi. Rest ECG: NSR with non-specific ST-T wave changes  Stress Procedure:  The patient received IV Lexiscan 0.4 mg over 15-seconds with concurrent low level exercise and then  Technetium 43m Sestamibi was injected at 30-seconds while the patient continued walking one more minute.  Quantitative spect images were obtained after a 45-minute delay.   Stress ECG: No significant ST segment change suggestive of ischemia.  QPS Raw Data Images:  Acquisition technically good; LVE. Stress Images:  There is decreased uptake in the anterolateral and inferior walls. Rest Images:  There is decreased uptake in the anterolateral and inferior walls. Subtraction (SDS):  No evidence of ischemia. Transient Ischemic Dilatation (Normal <1.22):  1.05 Lung/Heart Ratio (Normal <0.45):  0.33  Quantitative Gated Spect Images QGS EDV:  232 ml QGS ESV:  176 ml  Impression Exercise Capacity:  Lexiscan with low level exercise. BP Response:  Normal blood pressure response. Clinical Symptoms:  No chest pain or dyspnea. ECG Impression:  No significant ST segment change suggestive of ischemia. Comparison with Prior Nuclear Study: Compared to 01/13/14, no significant change.  Overall Impression:  High risk stress nuclear study with large, severe, fixed anterolateral and inferior defects consistent with prior infarct; no ischemia; study high risk due to reduced LV function.  LV Ejection Fraction: 24%.  LV Wall Motion:  Global hypokinesis and LVE.  Kirk Ruths

## 2015-01-20 ENCOUNTER — Telehealth: Payer: Self-pay | Admitting: *Deleted

## 2015-01-20 NOTE — Telephone Encounter (Signed)
pt notified of stress test results per Tera Helper NP and Dr. Acie Fredrickson no further work up needed, though will have [place] review. I did advise pt though it was discussed that he may need to have meds titrated. Pt agreeable to plan of care.

## 2015-01-25 ENCOUNTER — Encounter: Payer: Self-pay | Admitting: Physician Assistant

## 2015-01-26 ENCOUNTER — Telehealth: Payer: Self-pay | Admitting: *Deleted

## 2015-01-26 NOTE — Telephone Encounter (Signed)
s/w pt per Brynda Rim. PA to see if pt has reconsider ICD. Pt states waited 1 hour before Dr. Caryl Comes even came in the room to see hi, said he is "just not happy with Dr. Caryl Comes:. I offered Dr. Rayann Heman or Dr. Lovena Le, states wants to wait until see's Dr. [name]  In July 2016. I said I will let Brynda Rim. PA know of this.

## 2015-02-05 ENCOUNTER — Ambulatory Visit (INDEPENDENT_AMBULATORY_CARE_PROVIDER_SITE_OTHER): Payer: Medicare Other | Admitting: Podiatry

## 2015-02-05 DIAGNOSIS — M79673 Pain in unspecified foot: Secondary | ICD-10-CM | POA: Diagnosis not present

## 2015-02-05 DIAGNOSIS — B351 Tinea unguium: Secondary | ICD-10-CM

## 2015-02-05 DIAGNOSIS — Q828 Other specified congenital malformations of skin: Secondary | ICD-10-CM | POA: Diagnosis not present

## 2015-02-05 NOTE — Progress Notes (Signed)
   Subjective:    [name], male    DOB: 1951-04-08, 64 y.o.   MRN: [id number]  HPI  Pt presents for nail debridement and callus trim  Review of Systems     Objective:   Physical Exam pulses are strongly palpable bilateral. Hallux abductovalgus deformity hammertoe deformities are noted bilateral. He has pain on palpation nails 1 through 5 bilateral which are thick yellow dystrophic and clinically mycotic. He also has pain on palpation sub-first and fifth metatarsals bilaterally this is resulting in a reactive hyperkeratosis bilateral.        Assessment & Plan:  Assessment: Diabetes mellitus diet-controlled. Porokeratosis sub-fifth and sub-first bilateral. Pain in limb secondary to onychomycosis 1 through 5 bilateral.  Plan: Debridement of all reactive hyperkeratosis bilateral. Debridement of nails 1 through 5 bilateral covered service secondary to pain. Followup with him in 3 months

## 2015-04-16 NOTE — Addendum Note (Signed)
Addended by: Freada Bergeron on: 04/16/2015 08:45 AM   Modules accepted: Orders

## 2015-04-26 ENCOUNTER — Inpatient Hospital Stay (HOSPITAL_COMMUNITY)
Admission: EM | Admit: 2015-04-26 | Discharge: 2015-04-27 | DRG: 682 | Discharge status: Against Medical Advice | Payer: Medicare Other | Attending: Internal Medicine | Admitting: Internal Medicine

## 2015-04-26 ENCOUNTER — Emergency Department (HOSPITAL_COMMUNITY): Payer: Medicare Other

## 2015-04-26 ENCOUNTER — Encounter (HOSPITAL_COMMUNITY): Payer: Self-pay | Admitting: Emergency Medicine

## 2015-04-26 DIAGNOSIS — Z8249 Family history of ischemic heart disease and other diseases of the circulatory system: Secondary | ICD-10-CM | POA: Diagnosis not present

## 2015-04-26 DIAGNOSIS — F17210 Nicotine dependence, cigarettes, uncomplicated: Secondary | ICD-10-CM | POA: Diagnosis present

## 2015-04-26 DIAGNOSIS — E860 Dehydration: Secondary | ICD-10-CM | POA: Diagnosis present

## 2015-04-26 DIAGNOSIS — E1140 Type 2 diabetes mellitus with diabetic neuropathy, unspecified: Secondary | ICD-10-CM | POA: Diagnosis present

## 2015-04-26 DIAGNOSIS — E118 Type 2 diabetes mellitus with unspecified complications: Secondary | ICD-10-CM | POA: Diagnosis not present

## 2015-04-26 DIAGNOSIS — N19 Unspecified kidney failure: Secondary | ICD-10-CM

## 2015-04-26 DIAGNOSIS — T671XXD Heat syncope, subsequent encounter: Secondary | ICD-10-CM | POA: Diagnosis not present

## 2015-04-26 DIAGNOSIS — Z833 Family history of diabetes mellitus: Secondary | ICD-10-CM

## 2015-04-26 DIAGNOSIS — E119 Type 2 diabetes mellitus without complications: Secondary | ICD-10-CM

## 2015-04-26 DIAGNOSIS — Z823 Family history of stroke: Secondary | ICD-10-CM | POA: Diagnosis not present

## 2015-04-26 DIAGNOSIS — M109 Gout, unspecified: Secondary | ICD-10-CM | POA: Diagnosis present

## 2015-04-26 DIAGNOSIS — E785 Hyperlipidemia, unspecified: Secondary | ICD-10-CM | POA: Diagnosis present

## 2015-04-26 DIAGNOSIS — I10 Essential (primary) hypertension: Secondary | ICD-10-CM

## 2015-04-26 DIAGNOSIS — B20 Human immunodeficiency virus [HIV] disease: Secondary | ICD-10-CM | POA: Diagnosis present

## 2015-04-26 DIAGNOSIS — I129 Hypertensive chronic kidney disease with stage 1 through stage 4 chronic kidney disease, or unspecified chronic kidney disease: Secondary | ICD-10-CM | POA: Diagnosis present

## 2015-04-26 DIAGNOSIS — F1410 Cocaine abuse, uncomplicated: Secondary | ICD-10-CM | POA: Diagnosis present

## 2015-04-26 DIAGNOSIS — Z7982 Long term (current) use of aspirin: Secondary | ICD-10-CM | POA: Diagnosis not present

## 2015-04-26 DIAGNOSIS — F10129 Alcohol abuse with intoxication, unspecified: Secondary | ICD-10-CM | POA: Diagnosis present

## 2015-04-26 DIAGNOSIS — E875 Hyperkalemia: Secondary | ICD-10-CM | POA: Diagnosis present

## 2015-04-26 DIAGNOSIS — E872 Acidosis: Secondary | ICD-10-CM | POA: Diagnosis present

## 2015-04-26 DIAGNOSIS — N179 Acute kidney failure, unspecified: Principal | ICD-10-CM | POA: Diagnosis present

## 2015-04-26 DIAGNOSIS — Z820 Family history of epilepsy and other diseases of the nervous system: Secondary | ICD-10-CM

## 2015-04-26 DIAGNOSIS — F1910 Other psychoactive substance abuse, uncomplicated: Secondary | ICD-10-CM | POA: Diagnosis present

## 2015-04-26 DIAGNOSIS — T445X5A Adverse effect of predominantly beta-adrenoreceptor agonists, initial encounter: Secondary | ICD-10-CM | POA: Diagnosis present

## 2015-04-26 DIAGNOSIS — R55 Syncope and collapse: Secondary | ICD-10-CM

## 2015-04-26 DIAGNOSIS — Z8619 Personal history of other infectious and parasitic diseases: Secondary | ICD-10-CM | POA: Diagnosis not present

## 2015-04-26 DIAGNOSIS — N183 Chronic kidney disease, stage 3 (moderate): Secondary | ICD-10-CM | POA: Diagnosis present

## 2015-04-26 LAB — GLUCOSE, CAPILLARY
Glucose-Capillary: 155 mg/dL — ABNORMAL HIGH (ref 65–99)
Glucose-Capillary: 124 mg/dL — ABNORMAL HIGH (ref 65–99)

## 2015-04-26 LAB — BASIC METABOLIC PANEL
ANION GAP: 10 (ref 5–15)
ANION GAP: 8 (ref 5–15)
Anion gap: 6 (ref 5–15)
Anion gap: 7 (ref 5–15)
BUN: 34 mg/dL — ABNORMAL HIGH (ref 6–20)
BUN: 36 mg/dL — ABNORMAL HIGH (ref 6–20)
BUN: 36 mg/dL — ABNORMAL HIGH (ref 6–20)
BUN: 33 mg/dL — AB (ref 6–20)
CALCIUM: 8.1 mg/dL — AB (ref 8.9–10.3)
CHLORIDE: 114 mmol/L — AB (ref 101–111)
CHLORIDE: 118 mmol/L — AB (ref 101–111)
CHLORIDE: 115 mmol/L — AB (ref 101–111)
CO2: 17 mmol/L — ABNORMAL LOW (ref 22–32)
CO2: 15 mmol/L — ABNORMAL LOW (ref 22–32)
CO2: 15 mmol/L — ABNORMAL LOW (ref 22–32)
CO2: 17 mmol/L — ABNORMAL LOW (ref 22–32)
CREATININE: 2.63 mg/dL — AB (ref 0.61–1.24)
Calcium: 8.7 mg/dL — ABNORMAL LOW (ref 8.9–10.3)
Calcium: 8.8 mg/dL — ABNORMAL LOW (ref 8.9–10.3)
Calcium: 8.5 mg/dL — ABNORMAL LOW (ref 8.9–10.3)
Chloride: 112 mmol/L — ABNORMAL HIGH (ref 101–111)
Creatinine, Ser: 2.69 mg/dL — ABNORMAL HIGH (ref 0.61–1.24)
Creatinine, Ser: 2.24 mg/dL — ABNORMAL HIGH (ref 0.61–1.24)
Creatinine, Ser: 2.24 mg/dL — ABNORMAL HIGH (ref 0.61–1.24)
GFR calc Af Amer: 33 mL/min — ABNORMAL LOW (ref >60)
GFR calc non Af Amer: 29 mL/min — ABNORMAL LOW (ref >60)
GFR calc non Af Amer: 29 mL/min — ABNORMAL LOW (ref >60)
GFR, EST AFRICAN AMERICAN: 28 mL/min — AB (ref >60)
GFR, EST AFRICAN AMERICAN: 27 mL/min — AB (ref >60)
GFR, EST AFRICAN AMERICAN: 33 mL/min — AB (ref >60)
GFR, EST NON AFRICAN AMERICAN: 24 mL/min — AB (ref >60)
GFR, EST NON AFRICAN AMERICAN: 23 mL/min — AB (ref >60)
GLUCOSE: 212 mg/dL — AB (ref 65–99)
GLUCOSE: 155 mg/dL — AB (ref 65–99)
GLUCOSE: 222 mg/dL — AB (ref 65–99)
Glucose, Bld: 202 mg/dL — ABNORMAL HIGH (ref 65–99)
POTASSIUM: 6.5 mmol/L — AB (ref 3.5–5.1)
POTASSIUM: 6 mmol/L — AB (ref 3.5–5.1)
POTASSIUM: 5.6 mmol/L — AB (ref 3.5–5.1)
Potassium: 7.5 mmol/L (ref 3.5–5.1)
SODIUM: 137 mmol/L (ref 135–145)
SODIUM: 140 mmol/L (ref 135–145)
Sodium: 137 mmol/L (ref 135–145)
Sodium: 140 mmol/L (ref 135–145)

## 2015-04-26 LAB — POTASSIUM: Potassium: 7 mmol/L (ref 3.5–5.1)

## 2015-04-26 LAB — RAPID URINE DRUG SCREEN, HOSP PERFORMED
Amphetamines: NOT DETECTED
BENZODIAZEPINES: NOT DETECTED
Barbiturates: NOT DETECTED
Cocaine: POSITIVE — AB
Opiates: NOT DETECTED
Tetrahydrocannabinol: NOT DETECTED

## 2015-04-26 LAB — URINALYSIS, ROUTINE W REFLEX MICROSCOPIC
Bilirubin Urine: NEGATIVE
Glucose, UA: NEGATIVE mg/dL
HGB URINE DIPSTICK: NEGATIVE
Ketones, ur: NEGATIVE mg/dL
Leukocytes, UA: NEGATIVE
Nitrite: NEGATIVE
Protein, ur: 100 mg/dL — AB
SPECIFIC GRAVITY, URINE: 1.012 (ref 1.005–1.030)
UROBILINOGEN UA: 0.2 mg/dL (ref 0.0–1.0)
pH: 5 (ref 5.0–8.0)

## 2015-04-26 LAB — CBC
HCT: 45.3 % (ref 39.0–52.0)
HCT: 40.6 % (ref 39.0–52.0)
Hemoglobin: 15.3 g/dL (ref 13.0–17.0)
Hemoglobin: 13.9 g/dL (ref 13.0–17.0)
MCH: 32.6 pg (ref 26.0–34.0)
MCH: 32.9 pg (ref 26.0–34.0)
MCHC: 33.8 g/dL (ref 30.0–36.0)
MCHC: 34.2 g/dL (ref 30.0–36.0)
MCV: 96.4 fL (ref 78.0–100.0)
MCV: 96 fL (ref 78.0–100.0)
Platelets: 166 10*3/uL (ref 150–400)
Platelets: 150 10*3/uL (ref 150–400)
RBC: 4.7 MIL/uL (ref 4.22–5.81)
RBC: 4.23 MIL/uL (ref 4.22–5.81)
RDW: 14.6 % (ref 11.5–15.5)
RDW: 14.9 % (ref 11.5–15.5)
WBC: 6.6 10*3/uL (ref 4.0–10.5)
WBC: 7 10*3/uL (ref 4.0–10.5)

## 2015-04-26 LAB — I-STAT CHEM 8, ED
BUN: 39 mg/dL — ABNORMAL HIGH (ref 6–20)
CALCIUM ION: 1.22 mmol/L (ref 1.13–1.30)
CHLORIDE: 113 mmol/L — AB (ref 101–111)
Creatinine, Ser: 2.9 mg/dL — ABNORMAL HIGH (ref 0.61–1.24)
GLUCOSE: 194 mg/dL — AB (ref 65–99)
HCT: 47 % (ref 39.0–52.0)
HEMOGLOBIN: 16 g/dL (ref 13.0–17.0)
Potassium: 6.4 mmol/L (ref 3.5–5.1)
SODIUM: 140 mmol/L (ref 135–145)
TCO2: 14 mmol/L (ref 0–100)

## 2015-04-26 LAB — CK: Total CK: 226 U/L (ref 49–397)

## 2015-04-26 LAB — URINE MICROSCOPIC-ADD ON

## 2015-04-26 LAB — TYPE AND SCREEN
ABO/RH(D): A POS
ANTIBODY SCREEN: NEGATIVE

## 2015-04-26 LAB — ABO/RH: ABO/RH(D): A POS

## 2015-04-26 LAB — CBG MONITORING, ED
GLUCOSE-CAPILLARY: 193 mg/dL — AB (ref 65–99)
Glucose-Capillary: 146 mg/dL — ABNORMAL HIGH (ref 65–99)

## 2015-04-26 MED ORDER — LATANOPROST 0.005 % OP SOLN
1.0000 [drp] | Freq: Every day | OPHTHALMIC | Status: DC
  Administered 2015-04-26: 1 [drp] via OPHTHALMIC
  Filled 2015-04-26: qty 2.5

## 2015-04-26 MED ORDER — HEPARIN SODIUM (PORCINE) 5000 UNIT/ML IJ SOLN
5000.0000 [IU] | Freq: Three times a day (TID) | INTRAMUSCULAR | Status: DC
  Administered 2015-04-26 – 2015-04-27 (×3): 5000 [IU] via SUBCUTANEOUS
  Filled 2015-04-26 (×6): qty 1

## 2015-04-26 MED ORDER — HYDRALAZINE HCL 20 MG/ML IJ SOLN
10.0000 mg | Freq: Four times a day (QID) | INTRAMUSCULAR | Status: DC | PRN
  Administered 2015-04-26: 10 mg via INTRAVENOUS
  Filled 2015-04-26: qty 1

## 2015-04-26 MED ORDER — HYDROCODONE-ACETAMINOPHEN 5-325 MG PO TABS: 1.0000 | ORAL_TABLET | ORAL | Status: DC | PRN

## 2015-04-26 MED ORDER — ONDANSETRON HCL 4 MG PO TABS: 4.0000 mg | ORAL_TABLET | Freq: Four times a day (QID) | ORAL | Status: DC | PRN

## 2015-04-26 MED ORDER — ISOSORBIDE MONONITRATE ER 30 MG PO TB24
30.0000 mg | ORAL_TABLET | Freq: Every day | ORAL | Status: DC
  Administered 2015-04-26 – 2015-04-27 (×2): 30 mg via ORAL
  Filled 2015-04-26 (×2): qty 1

## 2015-04-26 MED ORDER — SODIUM CHLORIDE 0.9 % IV BOLUS (SEPSIS)
1000.0000 mL | Freq: Once | INTRAVENOUS | Status: AC
  Administered 2015-04-26: 1000 mL via INTRAVENOUS

## 2015-04-26 MED ORDER — ALBUTEROL SULFATE (2.5 MG/3ML) 0.083% IN NEBU: 2.5000 mg | INHALATION_SOLUTION | RESPIRATORY_TRACT | Status: DC | PRN

## 2015-04-26 MED ORDER — ASPIRIN EC 81 MG PO TBEC
81.0000 mg | DELAYED_RELEASE_TABLET | Freq: Every day | ORAL | Status: DC
  Administered 2015-04-26 – 2015-04-27 (×2): 81 mg via ORAL
  Filled 2015-04-26 (×2): qty 1

## 2015-04-26 MED ORDER — ADULT MULTIVITAMIN W/MINERALS CH
1.0000 | ORAL_TABLET | Freq: Every day | ORAL | Status: DC
  Administered 2015-04-26 – 2015-04-27 (×2): 1 via ORAL
  Filled 2015-04-26 (×2): qty 1

## 2015-04-26 MED ORDER — VITAMIN B-1 100 MG PO TABS
100.0000 mg | ORAL_TABLET | Freq: Every day | ORAL | Status: DC
  Administered 2015-04-26 – 2015-04-27 (×2): 100 mg via ORAL
  Filled 2015-04-26 (×2): qty 1

## 2015-04-26 MED ORDER — SODIUM CHLORIDE 0.9 % IV SOLN
INTRAVENOUS | Status: AC
  Administered 2015-04-26: 12:00:00 via INTRAVENOUS

## 2015-04-26 MED ORDER — INSULIN ASPART 100 UNIT/ML ~~LOC~~ SOLN
0.0000 [IU] | Freq: Three times a day (TID) | SUBCUTANEOUS | Status: DC
  Administered 2015-04-26: 2 [IU] via SUBCUTANEOUS
  Administered 2015-04-26: 1 [IU] via SUBCUTANEOUS
  Filled 2015-04-26: qty 1

## 2015-04-26 MED ORDER — HYDRALAZINE HCL 25 MG PO TABS
25.0000 mg | ORAL_TABLET | Freq: Three times a day (TID) | ORAL | Status: DC
  Administered 2015-04-26 – 2015-04-27 (×3): 25 mg via ORAL
  Filled 2015-04-26 (×5): qty 1

## 2015-04-26 MED ORDER — SODIUM CHLORIDE 0.9 % IV SOLN
1.0000 g | Freq: Once | INTRAVENOUS | Status: AC
  Administered 2015-04-26: 1 g via INTRAVENOUS
  Filled 2015-04-26: qty 10

## 2015-04-26 MED ORDER — ALLOPURINOL 300 MG PO TABS
300.0000 mg | ORAL_TABLET | Freq: Every day | ORAL | Status: DC
  Administered 2015-04-26 – 2015-04-27 (×2): 300 mg via ORAL
  Filled 2015-04-26 (×2): qty 1

## 2015-04-26 MED ORDER — LORAZEPAM 1 MG PO TABS: 1.0000 mg | ORAL_TABLET | Freq: Four times a day (QID) | ORAL | Status: DC | PRN

## 2015-04-26 MED ORDER — NITROGLYCERIN 0.4 MG SL SUBL: 0.4000 mg | SUBLINGUAL_TABLET | SUBLINGUAL | Status: DC | PRN

## 2015-04-26 MED ORDER — LORAZEPAM 2 MG/ML IJ SOLN: 1.0000 mg | Freq: Four times a day (QID) | INTRAMUSCULAR | Status: DC | PRN

## 2015-04-26 MED ORDER — ACETAMINOPHEN 650 MG RE SUPP: 650.0000 mg | Freq: Four times a day (QID) | RECTAL | Status: DC | PRN

## 2015-04-26 MED ORDER — INSULIN GLARGINE 100 UNIT/ML ~~LOC~~ SOLN
15.0000 [IU] | Freq: Every day | SUBCUTANEOUS | Status: DC
  Administered 2015-04-26 – 2015-04-27 (×2): 15 [IU] via SUBCUTANEOUS
  Filled 2015-04-26 (×2): qty 0.15

## 2015-04-26 MED ORDER — ASPIRIN 81 MG PO TABS: 81.0000 mg | ORAL_TABLET | Freq: Every day | ORAL | Status: DC

## 2015-04-26 MED ORDER — SODIUM POLYSTYRENE SULFONATE 15 GM/60ML PO SUSP
45.0000 g | Freq: Once | ORAL | Status: AC
  Administered 2015-04-26: 45 g via ORAL
  Filled 2015-04-26: qty 180

## 2015-04-26 MED ORDER — ATORVASTATIN CALCIUM 40 MG PO TABS
40.0000 mg | ORAL_TABLET | Freq: Every day | ORAL | Status: DC
Start: 2015-04-26 — End: 2015-04-27
  Administered 2015-04-26: 40 mg via ORAL
  Filled 2015-04-26 (×2): qty 1

## 2015-04-26 MED ORDER — ONDANSETRON HCL 4 MG/2ML IJ SOLN: 4.0000 mg | Freq: Four times a day (QID) | INTRAMUSCULAR | Status: DC | PRN

## 2015-04-26 MED ORDER — LORAZEPAM 1 MG PO TABS
0.0000 mg | ORAL_TABLET | Freq: Four times a day (QID) | ORAL | Status: DC
  Administered 2015-04-26: 2 mg via ORAL
  Filled 2015-04-26: qty 2

## 2015-04-26 MED ORDER — ACETAMINOPHEN 325 MG PO TABS: 650.0000 mg | ORAL_TABLET | Freq: Four times a day (QID) | ORAL | Status: DC | PRN

## 2015-04-26 MED ORDER — BISACODYL 10 MG RE SUPP: 10.0000 mg | Freq: Every day | RECTAL | Status: DC | PRN

## 2015-04-26 MED ORDER — ELVITEG-COBIC-EMTRICIT-TENOFAF 150-150-200-10 MG PO TABS
1.0000 | ORAL_TABLET | Freq: Every day | ORAL | Status: DC
  Administered 2015-04-26 – 2015-04-27 (×2): 1 via ORAL
  Filled 2015-04-26 (×3): qty 1

## 2015-04-26 MED ORDER — LORAZEPAM 1 MG PO TABS: 0.0000 mg | ORAL_TABLET | Freq: Two times a day (BID) | ORAL | Status: DC

## 2015-04-26 MED ORDER — POLYETHYLENE GLYCOL 3350 17 G PO PACK
17.0000 g | PACK | Freq: Every day | ORAL | Status: DC | PRN
Start: 2015-04-26 — End: 2015-04-27
  Filled 2015-04-26: qty 1

## 2015-04-26 MED ORDER — FOLIC ACID 1 MG PO TABS
1.0000 mg | ORAL_TABLET | Freq: Every day | ORAL | Status: DC
  Administered 2015-04-26 – 2015-04-27 (×2): 1 mg via ORAL
  Filled 2015-04-26 (×2): qty 1

## 2015-04-26 MED ORDER — SODIUM CHLORIDE 0.9 % IV SOLN
INTRAVENOUS | Status: DC
  Administered 2015-04-27: 1000 mL via INTRAVENOUS

## 2015-04-26 MED ORDER — THIAMINE HCL 100 MG/ML IJ SOLN
100.0000 mg | Freq: Every day | INTRAMUSCULAR | Status: DC
  Filled 2015-04-26: qty 1

## 2015-04-26 MED ORDER — SODIUM CHLORIDE 0.9 % IV SOLN
INTRAVENOUS | Status: DC
  Administered 2015-04-26: 1000 mL via INTRAVENOUS
  Administered 2015-04-26: 22:00:00 via INTRAVENOUS

## 2015-04-26 NOTE — ED Notes (Signed)
Patient returned from [place]. Resting in bed with eyes closed. No distress noted.
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
## 2015-04-26 NOTE — H&P (Signed)
Patient Demographics  Jacob [name], is a 64 y.o. male  MRN: [id number]   DOB - 02-15-1951  Admit Date - 04/26/2015  Outpatient Primary MD for the patient is Velna Hatchet, MD   With History of -  Past Medical History  Diagnosis Date  . Syphilis, unspecified   . Coronary atherosclerosis of native coronary artery     s/p surgery  . Type II or unspecified type diabetes mellitus without mention of complication, not stated as uncontrolled 2004    Diagnosed in 2004. Came to our clinic in 5/07 and was on Glucotrol XL 10 mg BID and metformin 500 BID. Max A1C on those two meds was 6.9 on 03/10/08. He has been able to be removed from the meds while maintaining a good A1C. He has DM neuropathy (although neuropathy could be multifactorial.)  . Human immunodeficiency virus (HIV) disease   . Allergic rhinitis, cause unspecified   . Gout, unspecified   . Hyperlipidemia   . HTN (hypertension)   . Ischemic cardiomyopathy   . Drug abuse     hx; tested for cocaine as recently as 2/08. says he is not using drugs now - avoided defib. for this reason   . Acute maxillary sinusitis   . Backache, unspecified   . Coronary atherosclerosis of artery bypass graft   . Closed fracture of intertrochanteric section of femur   . Chronic kidney disease, unspecified   . Tobacco use disorder   . History of hepatitis C   . GERD (gastroesophageal reflux disease)   . Arthritis   . Hx of cardiovascular stress test     Lexiscan Myoview 3/16:  Lg ant-lateral and inf scar, no ischemia, EF 24%; high risk      Past Surgical History  Procedure Laterality Date  . Coronary artery bypass graft    . Left intertrochanteric hip fracture      s/p intermedullary nail placement 2/08  . Left transverse mandibular fracture  10/05  . Laproscopic cholecystectomy  8/07    in for   Chief Complaint  Patient presents with  . Loss of Consciousness     HPI  Jacob [name]  is a 64 y.o. male, with past medical history of  HIV, diabetes mellitus, hypertension, polysubstance abuse, gout, presents secondary to fall and syncope, patient reports he did smoke crack cocaine, drink alcohol, where he felt lightheaded, had a fall, where he hit the back of the head, CT head with no acute findings, in ED workup significant for hyperkalemia, acute renal failure, creatinine of 2.6, patient was appropriately hydrated in ED, with despite that he remains to have hyperkalemia, and minimal improvement of his renal function, so hospitalist service requested to admit the patient, has no evidence of urinary retention, denies any chest pain, shortness of breath, cough, fever, chills, no dysuria, polyuria, reports he is compliant with his HIV medication.    Review of Systems    In addition to the HPI above,  No Fever-chills, No Headache, No changes with Vision or hearing, had syncope No problems swallowing food or Liquids, No Chest pain, Cough or Shortness of Breath, No Abdominal pain, No Nausea or Vommitting, Bowel movements are regular, No Blood in stool or Urine, No dysuria, No new skin rashes or bruises, No new joints pains-aches,  No new weakness, tingling, numbness in any extremity, No recent weight gain or loss, No polyuria, polydypsia or polyphagia, No significant Mental Stressors.  A full 10 point Review of Systems was done,  except as stated above, all other Review of Systems were negative.   Social History History  Substance Use Topics  . Smoking status: Current Every Day Smoker -- 0.75 packs/day for 36 years    Types: Cigarettes  . Smokeless tobacco: Never Used  . Alcohol Use: 0.0 oz/week    0 Standard drinks or equivalent per week     Comment: 2 - 24 oz beers / week     Family History Family History  Problem Relation Age of Onset  . Heart failure Father   . Heart attack Brother   . Drug abuse Brother   . Colon cancer Neg Hx   . Alzheimer's disease Mother   . Stroke Sister   . Hypertension Father   .  Hypertension Brother   . Diabetes Brother      Prior to Admission medications   Medication Sig Start Date End Date Taking? Authorizing Provider  allopurinol (ZYLOPRIM) 300 MG tablet Take 1 tablet (300 mg total) by mouth daily. 11/24/11   Heinz Knuckles, MD  amoxicillin (AMOXIL) 500 MG capsule Take 1 capsule (500 mg total) by mouth 3 (three) times daily. 01/03/15   Nicole Pisciotta, PA-C  aspirin 81 MG tablet Take 81 mg by mouth daily.      Historical Provider, MD  atorvastatin (LIPITOR) 40 MG tablet Take 40 mg by mouth 2 (two) times daily.  11/25/13   Historical Provider, MD  B-D UF III MINI PEN NEEDLES 31G X 5 MM MISC  09/09/14   Historical Provider, MD  carvedilol (COREG) 25 MG tablet Take 1 tablet (25 mg total) by mouth 2 (two) times daily with a meal. 01/07/15   Liliane Shi, PA-C  colchicine 0.6 MG tablet Take 0.6 mg by mouth continuous as needed. For gout.    Historical Provider, MD  elvitegravir-cobicistat-emtricitabine-tenofovir (GENVOYA) 150-150-200-10 MG TABS tablet Take 1 tablet by mouth daily with breakfast. 01/05/15   [name], MD  enalapril (VASOTEC) 10 MG tablet Take 1 tablet by mouth 2 (two) times daily. 10/28/12   Historical Provider, MD  Glucosamine-Chondroit-Vit C-Mn (GLUCOSAMINE CHONDR 1500 COMPLX PO) Take 2 tablets by mouth daily.     Historical Provider, MD  hydrALAZINE (APRESOLINE) 25 MG tablet Take 1 tablet (25 mg total) by mouth 3 (three) times daily. 11/13/14   [name], MD  isosorbide mononitrate (IMDUR) 30 MG 24 hr tablet Take 1 tablet (30 mg total) by mouth daily. 01/07/15   Scott Joylene Draft, PA-C  LANTUS SOLOSTAR 100 UNIT/ML Solostar Pen Inject 20 Units into the skin daily. diabetes 03/16/14   Historical Provider, MD  latanoprost (XALATAN) 0.005 % ophthalmic solution Place 1 drop into both eyes daily. 04/23/12   Historical Provider, MD  metFORMIN (GLUCOPHAGE) 500 MG tablet Take by mouth 2 (two) times daily with a meal.    Historical Provider, MD  naftifine  (NAFTIN) 1 % cream Apply 1 application topically daily as needed. Every time pt washes his feet he applies cream.    Historical Provider, MD  NAFTIN 2 % CREA 3 x daily with food. 11/27/14   Historical Provider, MD  nitroGLYCERIN (NITROSTAT) 0.3 MG SL tablet Place 1 tablet (0.3 mg total) under the tongue every 5 (five) minutes as needed. 01/07/15   Liliane Shi, PA-C    Allergies  Allergen Reactions  . Amphetamines Other (See Comments)    unknown    Physical Exam  Vitals  Blood pressure 171/86, pulse 81, temperature 97.5 F (36.4 C), temperature source  Oral, resp. rate 18, SpO2 99 %.   1. General elderly male lying in bed in NAD,   2. Normal affect and insight, Not Suicidal or Homicidal, Awake Alert, Oriented X 3.  3. No F.N deficits, ALL C.Nerves Intact, Strength 5/5 all 4 extremities, Sensation intact all 4 extremities, Plantars down going.  4. Ears and Eyes appear Normal, Conjunctivae clear, PERRLA. Moist Oral Mucosa.  5. Supple Neck, No JVD, No cervical lymphadenopathy appriciated, No Carotid Bruits.  6. Symmetrical Chest wall movement, Good air movement bilaterally, CTAB.  7. RRR, No Gallops, Rubs or Murmurs, No Parasternal Heave.  8. Positive Bowel Sounds, Abdomen Soft, No tenderness, No organomegaly appriciated,No rebound -guarding or rigidity.  9.  No Cyanosis, Normal Skin Turgor, No Skin Rash or Bruise.  10. Good muscle tone,  joints appear normal , no effusions, Normal ROM.  11. No Palpable Lymph Nodes in Neck or Axillae   Data Review  CBC  Recent Labs Lab 04/26/15 0102 04/26/15 0152  WBC 6.6  --   HGB 15.3 16.0  HCT 45.3 47.0  PLT 166  --   MCV 96.4  --   MCH 32.6  --   MCHC 33.8  --   RDW 14.6  --    ------------------------------------------------------------------------------------------------------------------  Chemistries   Recent Labs Lab 04/26/15 0102 04/26/15 0152 04/26/15 0225 04/26/15 0357 04/26/15 0719  NA 137 140 137  --  140    K 6.5* 6.4* 7.5* 7.0* 6.0*  CL 114* 113* 112*  --  118*  CO2 17*  --  15*  --  15*  GLUCOSE 202* 194* 212*  --  155*  BUN 34* 39* 36*  --  36*  CREATININE 2.63* 2.90* 2.69*  --  2.24*  CALCIUM 8.7*  --  8.8*  --  8.1*   ------------------------------------------------------------------------------------------------------------------ CrCl cannot be calculated (Unknown ideal weight.). ------------------------------------------------------------------------------------------------------------------ No results for input(s): TSH, T4TOTAL, T3FREE, THYROIDAB in the last 72 hours.  Invalid input(s): FREET3   Coagulation profile No results for input(s): INR, PROTIME in the last 168 hours. ------------------------------------------------------------------------------------------------------------------- No results for input(s): DDIMER in the last 72 hours. -------------------------------------------------------------------------------------------------------------------  Cardiac Enzymes No results for input(s): CKMB, TROPONINI, MYOGLOBIN in the last 168 hours.  Invalid input(s): CK ------------------------------------------------------------------------------------------------------------------ Invalid input(s): POCBNP   ---------------------------------------------------------------------------------------------------------------  Urinalysis    Component Value Date/Time   COLORURINE YELLOW 09/06/2013 2239   APPEARANCEUR CLOUDY* 09/06/2013 2239   LABSPEC 1.019 09/06/2013 2239   PHURINE 5.0 09/06/2013 2239   GLUCOSEU NEGATIVE 09/06/2013 2239   GLUCOSEU NEG mg/dL 09/20/2010 2058   HGBUR SMALL* 09/06/2013 2239   HGBUR negative 05/31/2010 0948   BILIRUBINUR NEGATIVE 09/06/2013 2239   KETONESUR NEGATIVE 09/06/2013 2239   PROTEINUR 100* 09/06/2013 2239   UROBILINOGEN 1.0 09/06/2013 2239   NITRITE NEGATIVE 09/06/2013 2239   LEUKOCYTESUR NEGATIVE 09/06/2013 2239     ----------------------------------------------------------------------------------------------------------------  Imaging results:   Ct Head Wo Contrast  04/26/2015   CLINICAL DATA:  Syncopal episode.  Struck back of head.  EXAM: CT HEAD WITHOUT CONTRAST  TECHNIQUE: Contiguous axial images were obtained from the base of the skull through the vertex without intravenous contrast.  COMPARISON:  08/13/2004  FINDINGS: There is no intracranial hemorrhage or extra-axial fluid collection. There is mild generalized atrophy. There is mild hypodensity suggesting microvascular disease. Skull base and calvarium are intact.  IMPRESSION: Negative for acute intracranial traumatic injury. Mild generalized atrophy and chronic microvascular changes.   Electronically Signed   By: Andreas Newport M.D.   On: 04/26/2015  02:25    My personal review of EKG: Rhythm NSR, Rate  67 /min, QTc 486 , no Acute ST changes    Assessment & Plan  Active Problems:   Human immunodeficiency virus (HIV) disease   Gout   Polysubstance abuse   Hyperkalemia   Acute renal failure   HTN (hypertension)   Syncope   DM (diabetes mellitus)    Acute renal failure - Baseline creatinine is 1.5, creatinine is 2.6 on admission, this is most likely related to dehydration and volume depletion, will check total CK to rule out rhabdo patient was using crack cocaine, no evidence of urinary retention, hold nephrotoxic medication including enalapril and metformin, continue with IV fluids.  Hyperkalemia - Admit to telemetry, no EKG changes, given calcium gluconate and Kayexalate in ED, will recheck level in few hours.  Syncope - Continue to dehydration, cocaine abuse, alcohol intoxication, monitor on telemetry.  HIV - Resume on Genvoya.  Hypertension - Uncontrolled, resume on home medication, hold Coreg as recent cocaine abuse, add when necessary hydralazine.  Polysubstance abuse - Patient was counseled  Diabetes mellitus -  Hold metformin, continue with Lantus, start on insulin sliding scale  Alcohol abuse - Patient reports he only uses alcohol over the weekend, 3-4 beers, no clinic today concerned she, will monitor closely for any signs of withdrawals, will keep him on CIWA protocol    DVT Prophylaxis Heparin -    AM Labs Ordered, also please review Full Orders  Family Communication: Admission, patients condition and plan of care including tests being ordered have been discussed with the patient and  who indicate understanding and agree with the plan and Code Status.  Code Status full  Likely DC to  home  Condition GUARDED    Time spent in minutes : 65 minutes    Vinal Rosengrant M.D on 04/26/2015 at 10:28 AM  Between 7am to 7pm - Pager - [phone]  After 7pm go to [email or web address] - password [email or web address]  And look for the night coverage person covering me after hours  [facility] Office  [phone]

## 2015-04-26 NOTE — ED Notes (Signed)
Potassium drawn by lab.

## 2015-04-26 NOTE — Discharge Instructions (Signed)
Syncope °Syncope is a medical term for fainting or passing out. This means you lose consciousness and drop to the ground. People are generally unconscious for less than 5 minutes. You may have some muscle twitches for up to 15 seconds before waking up and returning to normal. Syncope occurs more often in older adults, but it can happen to anyone. While most causes of syncope are not dangerous, syncope can be a sign of a serious medical problem. It is important to seek medical care.  °CAUSES  °Syncope is caused by a sudden drop in blood flow to the brain. The specific cause is often not determined. Factors that can bring on syncope include: °· Taking medicines that lower blood pressure. °· Sudden changes in posture, such as standing up quickly. °· Taking more medicine than prescribed. °· Standing in one place for too long. °· Seizure disorders. °· Dehydration and excessive exposure to heat. °· Low blood sugar (hypoglycemia). °· Straining to have a bowel movement. °· Heart disease, irregular heartbeat, or other circulatory problems. °· Fear, emotional distress, seeing blood, or severe pain. °SYMPTOMS  °Right before fainting, you may: °· Feel dizzy or light-headed. °· Feel nauseous. °· See all white or all black in your field of vision. °· Have cold, clammy skin. °DIAGNOSIS  °Your health care provider will ask about your symptoms, perform a physical exam, and perform an electrocardiogram (ECG) to record the electrical activity of your heart. Your health care provider may also perform other heart or blood tests to determine the cause of your syncope which may include: °· Transthoracic echocardiogram (TTE). During echocardiography, sound waves are used to evaluate how blood flows through your heart. °· Transesophageal echocardiogram (TEE). °· Cardiac monitoring. This allows your health care provider to monitor your heart rate and rhythm in real time. °· Holter monitor. This is a portable device that records your  heartbeat and can help diagnose heart arrhythmias. It allows your health care provider to track your heart activity for several days, if needed. °· Stress tests by exercise or by giving medicine that makes the heart beat faster. °TREATMENT  °In most cases, no treatment is needed. Depending on the cause of your syncope, your health care provider may recommend changing or stopping some of your medicines. °HOME CARE INSTRUCTIONS °· Have someone stay with you until you feel stable. °· Do not drive, use machinery, or play sports until your health care provider says it is okay. °· Keep all follow-up appointments as directed by your health care provider. °· Lie down right away if you start feeling like you might faint. Breathe deeply and steadily. Wait until all the symptoms have passed. °· Drink enough fluids to keep your urine clear or pale yellow. °· If you are taking blood pressure or heart medicine, get up slowly and take several minutes to sit and then stand. This can reduce dizziness. °SEEK IMMEDIATE MEDICAL CARE IF:  °· You have a severe headache. °· You have unusual pain in the chest, abdomen, or back. °· You are bleeding from your mouth or rectum, or you have black or tarry stool. °· You have an irregular or very fast heartbeat. °· You have pain with breathing. °· You have repeated fainting or seizure-like jerking during an episode. °· You faint when sitting or lying down. °· You have confusion. °· You have trouble walking. °· You have severe weakness. °· You have vision problems. °If you fainted, call your local emergency services (911 in [place] Do not drive   yourself to the hospital.  MAKE SURE YOU:  Understand these instructions.  Will watch your condition.  Will get help right away if you are not doing well or get worse. Document Released: 10/10/2005 Document Revised: 10/15/2013 Document Reviewed: 12/09/2011 [facility] Patient Information 2015 [place], [place]. This information is not intended to replace  advice given to you by your health care provider. Make sure you discuss any questions you have with your health care provider.   Dehydration, Adult Dehydration means your body does not have as much fluid as it needs. Your kidneys, brain, and heart will not work properly without the right amount of fluids and salt.  HOME CARE  Ask your doctor how to replace body fluid losses (rehydrate).  Drink enough fluids to keep your pee (urine) clear or pale yellow.  Drink small amounts of fluids often if you feel sick to your stomach (nauseous) or throw up (vomit).  Eat like you normally do.  Avoid:  Foods or drinks high in sugar.  Bubbly (carbonated) drinks.  Juice.  Very hot or cold fluids.  Drinks with caffeine.  Fatty, greasy foods.  Alcohol.  Tobacco.  Eating too much.  Gelatin desserts.  Wash your hands to avoid spreading germs (bacteria, viruses).  Only take medicine as told by your doctor.  Keep all doctor visits as told. GET HELP RIGHT AWAY IF:   You cannot drink something without throwing up.  You get worse even with treatment.  Your vomit has blood in it or looks greenish.  Your poop (stool) has blood in it or looks black and tarry.  You have not peed in 6 to 8 hours.  You pee a small amount of very dark pee.  You have a fever.  You pass out (faint).  You have belly (abdominal) pain that gets worse or stays in one spot (localizes).  You have a rash, stiff neck, or bad headache.  You get easily annoyed, sleepy, or are hard to wake up.  You feel weak, dizzy, or very thirsty. MAKE SURE YOU:   Understand these instructions.  Will watch your condition.  Will get help right away if you are not doing well or get worse. Document Released: 08/06/2009 Document Revised: 01/02/2012 Document Reviewed: 05/30/2011 [facility] Patient Information 2015 [place], [place]. This information is not intended to replace advice given to you by your health care provider.  Make sure you discuss any questions you have with your health care provider.

## 2015-04-26 NOTE — ED Notes (Signed)
Per EMS: Pt called after a syncopal episode on the toilet.  Pt hit the back of his head during the fall (EMs notes hematoma to back of head).  Pt has been being treated for dark stools with abx and probiotics.  Pt consumed ETOH and crack/cocaine this evening, ate a large meal and went to bed.  Woke up in the middle of the night to use the restroom when he passed out.  Alert and oriented when EMS arrived.  Palpable BP 60 at arrival.  BP 98/58 in room at this rime.  Pt has received 78ml Normal Saline via EMS.  Pt on low dose ASA.

## 2015-04-26 NOTE — ED Notes (Signed)
Patient using bedside commode again at the present time.

## 2015-04-26 NOTE — ED Notes (Signed)
Dr Tawnya Crook notified of redraw on potassium 7.5 slightly hemo.

## 2015-04-26 NOTE — ED Notes (Signed)
CBG 193. Nurse notified.

## 2015-04-26 NOTE — ED Notes (Signed)
Patient using bedside commode at present time.

## 2015-04-26 NOTE — ED Notes (Signed)
Dr Tawnya Crook notified of potassium 6.5

## 2015-04-26 NOTE — ED Notes (Signed)
Patient transported to CT 

## 2015-04-26 NOTE — ED Provider Notes (Signed)
CSN: [id number]     Arrival date & time 04/26/15  0007 History   This chart was scribed for  Ernestina Patches, MD by Altamease Oiler, ED Scribe. This patient was seen in room D36C/D36C and the patient's care was started at 1:07 AM.     Chief Complaint  Patient presents with  . Loss of Consciousness     Patient is a 64 y.o. male presenting with syncope. The history is provided by the patient. No language interpreter was used.  Loss of Consciousness Episode history:  Single Progression:  Resolved Chronicity:  New Context: bowel movement   Witnessed: no   Relieved by:  Nothing Worsened by:  Nothing tried Ineffective treatments:  None tried Associated symptoms: diaphoresis and weakness   Associated symptoms: no chest pain, no confusion, no difficulty breathing, no dizziness, no fever, no focal weakness, no headaches, no nausea, no palpitations, no recent injury, no recent surgery, no rectal bleeding, no seizures, no shortness of breath, no visual change and no vomiting   Risk factors: coronary artery disease    KEITHEN WOOLSON is a 64 y.o. male with PMHx of HIV, DM, HLD, CAD s/p CABG, CKD, and drug abuse who presents to the Emergency Department complaining of a syncopal episode this evening just PTA. Pt reports that he was on the way to have a bowel movement when he lost consciousness and hit his head on the toilet. After the episode he crawled to the hallway where he was found by his niece.  Earlier in the day he had an episode of light headedness, weakness, and sweating with a bowel movement. Associated symptoms include chills. Pt denies abdominal pain, CP, SOB, palpitations, nausea, vomiting, fever, change in appetite, hematochezia, and melena. He has had frequent diarrhea for the last 2 months after his Metformin was increased. He has 2 normal-colored loose stools per day. He usually uses an 81 mg dose of aspirin daily but missed his dose today. He drank 3 beers  and a "sip" of wine prior to the  event. He notes using crack cocaine last night.   Past Medical History  Diagnosis Date  . Syphilis, unspecified   . Coronary atherosclerosis of native coronary artery     s/p surgery  . Type II or unspecified type diabetes mellitus without mention of complication, not stated as uncontrolled 2004    Diagnosed in 2004. Came to our clinic in 5/07 and was on Glucotrol XL 10 mg BID and metformin 500 BID. Max A1C on those two meds was 6.9 on 03/10/08. He has been able to be removed from the meds while maintaining a good A1C. He has DM neuropathy (although neuropathy could be multifactorial.)  . Human immunodeficiency virus (HIV) disease   . Allergic rhinitis, cause unspecified   . Gout, unspecified   . Hyperlipidemia   . HTN (hypertension)   . Ischemic cardiomyopathy   . Drug abuse     hx; tested for cocaine as recently as 2/08. says he is not using drugs now - avoided defib. for this reason   . Acute maxillary sinusitis   . Backache, unspecified   . Coronary atherosclerosis of artery bypass graft   . Closed fracture of intertrochanteric section of femur   . Chronic kidney disease, unspecified   . Tobacco use disorder   . History of hepatitis C   . GERD (gastroesophageal reflux disease)   . Arthritis   . Hx of cardiovascular stress test     Lexiscan Myoview 3/16:  Lg ant-lateral and inf scar, no ischemia, EF 24%; high risk   Past Surgical History  Procedure Laterality Date  . Coronary artery bypass graft    . Left intertrochanteric hip fracture      s/p intermedullary nail placement 2/08  . Left transverse mandibular fracture  10/05  . Laproscopic cholecystectomy  8/07   Family History  Problem Relation Age of Onset  . Heart failure Father   . Heart attack Brother   . Drug abuse Brother   . Colon cancer Neg Hx   . Alzheimer's disease Mother   . Stroke Sister   . Hypertension Father   . Hypertension Brother   . Diabetes Brother    History  Substance Use Topics  . Smoking  status: Current Every Day Smoker -- 0.75 packs/day for 36 years    Types: Cigarettes  . Smokeless tobacco: Never Used  . Alcohol Use: 0.0 oz/week    0 Standard drinks or equivalent per week     Comment: 2 - 24 oz beers / week    Review of Systems  Constitutional: Positive for chills and diaphoresis. Negative for fever, activity change, appetite change and fatigue.  HENT: Negative for congestion, facial swelling, rhinorrhea and trouble swallowing.        An area of swelling at the back of the head  Eyes: Negative for photophobia and pain.  Respiratory: Negative for cough, chest tightness and shortness of breath.   Cardiovascular: Positive for syncope. Negative for chest pain, palpitations and leg swelling.  Gastrointestinal: Negative for nausea, vomiting, abdominal pain, diarrhea and constipation.  Endocrine: Negative for polydipsia and polyuria.  Genitourinary: Negative for dysuria, urgency, decreased urine volume and difficulty urinating.  Musculoskeletal: Negative for back pain and gait problem.  Skin: Negative for color change, rash and wound.  Allergic/Immunologic: Negative for immunocompromised state.  Neurological: Positive for syncope, weakness and light-headedness. Negative for dizziness, focal weakness, seizures, facial asymmetry, speech difficulty, numbness and headaches.  Psychiatric/Behavioral: Negative for confusion, decreased concentration and agitation.  All other systems reviewed and are negative.   Allergies  Amphetamines  Home Medications   Prior to Admission medications   Medication Sig Start Date End Date Taking? Authorizing Provider  allopurinol (ZYLOPRIM) 300 MG tablet Take 1 tablet (300 mg total) by mouth daily. 11/24/11  Yes Heinz Knuckles, MD  ANORO ELLIPTA 62.5-25 MCG/INH AEPB Inhale 1 puff into the lungs daily. 03/09/15  Yes Historical Provider, MD  atorvastatin (LIPITOR) 40 MG tablet Take 40 mg by mouth daily.  11/25/13  Yes Historical Provider, MD   carvedilol (COREG) 25 MG tablet Take 1 tablet (25 mg total) by mouth 2 (two) times daily with a meal. 01/07/15  Yes Scott T Kathlen Mody, PA-C  enalapril (VASOTEC) 10 MG tablet Take 1 tablet by mouth 2 (two) times daily. 10/28/12  Yes Historical Provider, MD  hydrALAZINE (APRESOLINE) 25 MG tablet Take 1 tablet (25 mg total) by mouth 3 (three) times daily. 11/13/14  Yes [name], MD  isosorbide mononitrate (IMDUR) 30 MG 24 hr tablet Take 1 tablet (30 mg total) by mouth daily. 01/07/15  Yes Scott T Weaver, PA-C  LANTUS SOLOSTAR 100 UNIT/ML Solostar Pen Inject 20 Units into the skin daily. diabetes 03/16/14  Yes Historical Provider, MD  latanoprost (XALATAN) 0.005 % ophthalmic solution Place 1 drop into both eyes daily. 04/23/12  Yes Historical Provider, MD  metFORMIN (GLUCOPHAGE) 500 MG tablet Take 1,000 mg by mouth 2 (two) times daily with a meal.  Yes Historical Provider, MD  NAFTIN 2 % CREA Apply 1 application topically 2 (two) times daily.  11/27/14  Yes Historical Provider, MD  amoxicillin (AMOXIL) 500 MG capsule Take 1 capsule (500 mg total) by mouth 3 (three) times daily. Patient not taking: Reported on 04/26/2015 01/03/15   Elmyra Ricks Pisciotta, PA-C  aspirin 81 MG tablet Take 81 mg by mouth daily.      Historical Provider, MD  B-D UF III MINI PEN NEEDLES 31G X 5 MM MISC  09/09/14   Historical Provider, MD  colchicine 0.6 MG tablet Take 0.6 mg by mouth continuous as needed. For gout.    Historical Provider, MD  elvitegravir-cobicistat-emtricitabine-tenofovir (GENVOYA) 150-150-200-10 MG TABS tablet Take 1 tablet by mouth daily with breakfast. 01/05/15   [name], MD  Glucosamine-Chondroit-Vit C-Mn (GLUCOSAMINE CHONDR 1500 COMPLX PO) Take 2 tablets by mouth daily.     Historical Provider, MD  nitroGLYCERIN (NITROSTAT) 0.3 MG SL tablet Place 1 tablet (0.3 mg total) under the tongue every 5 (five) minutes as needed. 01/07/15   Liliane Shi, PA-C   Triage Vitals: BP 101/59 mmHg  Pulse 74  Temp(Src) 97.5  F (36.4 C) (Oral)  Resp 18  SpO2 96% Physical Exam  Constitutional: He is oriented to person, place, and time. He appears well-developed and well-nourished. No distress.  HENT:  Head: Normocephalic.  Mouth/Throat: No oropharyngeal exudate.  Contusion to occiput  Eyes: Pupils are equal, round, and reactive to light.  Neck: Normal range of motion. Neck supple.  Cardiovascular: Normal rate, regular rhythm and normal heart sounds.  Exam reveals no gallop and no friction rub.   No murmur heard. Pulmonary/Chest: Effort normal and breath sounds normal. No respiratory distress. He has no wheezes. He has no rales.  Abdominal: Soft. Bowel sounds are normal. He exhibits no distension and no mass. There is no tenderness. There is no rebound and no guarding.  Musculoskeletal: Normal range of motion. He exhibits no edema or tenderness.  Neurological: He is alert and oriented to person, place, and time. He has normal strength. He displays no atrophy and no tremor. No cranial nerve deficit or sensory deficit. He exhibits normal muscle tone. Coordination normal. GCS eye subscore is 4. GCS verbal subscore is 5. GCS motor subscore is 6.  Skin: Skin is warm and dry.  Psychiatric: He has a normal mood and affect.    ED Course  Procedures  DIAGNOSTIC STUDIES: Oxygen Saturation is 96% on RA, normal by my interpretation.    COORDINATION OF CARE: 1:07 AM Discussed treatment plan which includes lab work, EKG, and CT head with pt at bedside and pt agreed to plan. Labs Review Labs Reviewed  BASIC METABOLIC PANEL - Abnormal; Notable for the following:    Potassium 6.5 (*)    Chloride 114 (*)    CO2 17 (*)    Glucose, Bld 202 (*)    BUN 34 (*)    Creatinine, Ser 2.63 (*)    Calcium 8.7 (*)    GFR calc non Af Amer 24 (*)    GFR calc Af Amer 28 (*)    All other components within normal limits  BASIC METABOLIC PANEL - Abnormal; Notable for the following:    Potassium 7.5 (*)    Chloride 112 (*)    CO2  15 (*)    Glucose, Bld 212 (*)    BUN 36 (*)    Creatinine, Ser 2.69 (*)    Calcium 8.8 (*)    GFR calc  non Af Amer 23 (*)    GFR calc Af Amer 27 (*)    All other components within normal limits  POTASSIUM - Abnormal; Notable for the following:    Potassium 7.0 (*)    All other components within normal limits  BASIC METABOLIC PANEL - Abnormal; Notable for the following:    Potassium 6.0 (*)    Chloride 118 (*)    CO2 15 (*)    Glucose, Bld 155 (*)    BUN 36 (*)    Creatinine, Ser 2.24 (*)    Calcium 8.1 (*)    GFR calc non Af Amer 29 (*)    GFR calc Af Amer 33 (*)    All other components within normal limits  BASIC METABOLIC PANEL - Abnormal; Notable for the following:    Potassium 5.6 (*)    Chloride 115 (*)    CO2 17 (*)    Glucose, Bld 222 (*)    BUN 33 (*)    Creatinine, Ser 2.24 (*)    Calcium 8.5 (*)    GFR calc non Af Amer 29 (*)    GFR calc Af Amer 33 (*)    All other components within normal limits  CBG MONITORING, ED - Abnormal; Notable for the following:    Glucose-Capillary 193 (*)    All other components within normal limits  I-STAT CHEM 8, ED - Abnormal; Notable for the following:    Potassium 6.4 (*)    Chloride 113 (*)    BUN 39 (*)    Creatinine, Ser 2.90 (*)    Glucose, Bld 194 (*)    All other components within normal limits  CBG MONITORING, ED - Abnormal; Notable for the following:    Glucose-Capillary 146 (*)    All other components within normal limits  CBC  CK  CBC  URINALYSIS, ROUTINE W REFLEX MICROSCOPIC (NOT AT [facility] [address])  URINE RAPID DRUG SCREEN, HOSP PERFORMED  HEMOGLOBIN [id number]  BASIC METABOLIC PANEL  CBC  GLUCOSE, CAPILLARY  TYPE AND SCREEN  ABO/RH    Imaging Review Ct Head Wo Contrast  04/26/2015   CLINICAL DATA:  Syncopal episode.  Struck back of head.  EXAM: CT HEAD WITHOUT CONTRAST  TECHNIQUE: Contiguous axial images were obtained from the base of the skull through the vertex without intravenous contrast.  COMPARISON:  08/13/2004   FINDINGS: There is no intracranial hemorrhage or extra-axial fluid collection. There is mild generalized atrophy. There is mild hypodensity suggesting microvascular disease. Skull base and calvarium are intact.  IMPRESSION: Negative for acute intracranial traumatic injury. Mild generalized atrophy and chronic microvascular changes.   Electronically Signed   By: Andreas Newport M.D.   On: 04/26/2015 02:25     EKG Interpretation   Date/Time:  Sunday April 26 2015 00:15:12 EDT Ventricular Rate:  72 PR Interval:  203 QRS Duration: 120 QT Interval:  479 QTC Calculation: 524 R Axis:   -19 Text Interpretation:  Age not entered, assumed to be  64 years old for  purpose of ECG interpretation Sinus rhythm baseline artifact,  TWI I, aVL,  V5, V6, unchanged.  Confirmed by Sabina Beavers  MD, [place] [id number]) on 04/26/2015  1:35:13 AM      MDM   Final diagnoses:  Vasovagal syncope  Dehydration  Renal failure  Hyperkalemia   Pt is a 64 y.o. male with Pmhx as above who presents with syncopal episode, after walking ot bathroom to try to have a BM. He also reports drinking ETOH  this morning, using crack cocaine. Denies CP, SOB, ab pain,  Dark or bloody stools.  He has chronic irregular episodes of diarrhea since being placed on metformin.  On physical exam, vital signs are stable.  He appears in no acute distress.  He has no focal neuro findings.  He is tender moderately-sized occipital hematoma.  He takes aspirin daily.  Given age, aspirin use, and use of drugs and alcohol today.  CT head has been ordered.   4:56 AM CT negative. Pt has +orthostatic VS. Labs showing hyperkalemia, ARF. Repeat K elevated,  With slight hemolysis, no EKG changes. CR midly elevated as well.  Will plan on continued hydration.  8AM repeat BMP ordered, will be follow by Dr. [name]. If K,/Cr improving, I feel pt can be d/c'd home.    I personally performed the services described in this documentation, which was scribed in my  presence. The recorded information has been reviewed and is accurate.    Ernestina Patches, MD 04/26/15 [phone]

## 2015-04-26 NOTE — ED Notes (Signed)
Dr Tawnya Crook notified of potassium 7.0

## 2015-04-26 NOTE — Progress Notes (Signed)
At 1300 pt arived to floor from ED via stretcher .  A&0x4,  Oriented to surrounding. Call bell at reach and instructed to call as needed.  Verbalized understanding.  Will continue to monitor.  Lonney Revak,RN.

## 2015-04-27 DIAGNOSIS — E875 Hyperkalemia: Secondary | ICD-10-CM

## 2015-04-27 DIAGNOSIS — R55 Syncope and collapse: Secondary | ICD-10-CM

## 2015-04-27 DIAGNOSIS — E872 Acidosis: Secondary | ICD-10-CM

## 2015-04-27 DIAGNOSIS — N179 Acute kidney failure, unspecified: Principal | ICD-10-CM

## 2015-04-27 LAB — BASIC METABOLIC PANEL
Anion gap: 7 (ref 5–15)
BUN: 26 mg/dL — ABNORMAL HIGH (ref 6–20)
CALCIUM: 8.2 mg/dL — AB (ref 8.9–10.3)
CHLORIDE: 116 mmol/L — AB (ref 101–111)
CO2: 14 mmol/L — ABNORMAL LOW (ref 22–32)
CREATININE: 1.6 mg/dL — AB (ref 0.61–1.24)
GFR calc Af Amer: 50 mL/min — ABNORMAL LOW (ref >60)
GFR, EST NON AFRICAN AMERICAN: 43 mL/min — AB (ref >60)
Glucose, Bld: 130 mg/dL — ABNORMAL HIGH (ref 65–99)
POTASSIUM: 4.8 mmol/L (ref 3.5–5.1)
Sodium: 137 mmol/L (ref 135–145)

## 2015-04-27 LAB — CBC
HCT: 37.4 % — ABNORMAL LOW (ref 39.0–52.0)
Hemoglobin: 13 g/dL (ref 13.0–17.0)
MCH: 32.7 pg (ref 26.0–34.0)
MCHC: 34.8 g/dL (ref 30.0–36.0)
MCV: 94 fL (ref 78.0–100.0)
PLATELETS: 130 10*3/uL — AB (ref 150–400)
RBC: 3.98 MIL/uL — ABNORMAL LOW (ref 4.22–5.81)
RDW: 14.7 % (ref 11.5–15.5)
WBC: 7.9 10*3/uL (ref 4.0–10.5)

## 2015-04-27 LAB — GLUCOSE, CAPILLARY: GLUCOSE-CAPILLARY: 136 mg/dL — AB (ref 65–99)

## 2015-04-27 MED ORDER — SODIUM BICARBONATE 650 MG PO TABS: 650.0000 mg | ORAL_TABLET | Freq: Two times a day (BID) | ORAL | Status: DC

## 2015-04-27 MED ORDER — INSULIN ASPART 100 UNIT/ML ~~LOC~~ SOLN
0.0000 [IU] | Freq: Three times a day (TID) | SUBCUTANEOUS | Status: DC
  Administered 2015-04-27: 1 [IU] via SUBCUTANEOUS

## 2015-04-27 MED ORDER — AMLODIPINE BESYLATE 5 MG PO TABS
5.0000 mg | ORAL_TABLET | Freq: Every day | ORAL | Status: DC
Start: 2015-04-27 — End: 2015-06-12

## 2015-04-27 MED ORDER — SODIUM BICARBONATE 650 MG PO TABS
650.0000 mg | ORAL_TABLET | Freq: Two times a day (BID) | ORAL | Status: DC
  Administered 2015-04-27: 650 mg via ORAL
  Filled 2015-04-27 (×3): qty 1

## 2015-04-27 NOTE — Progress Notes (Signed)
Patient was seen and examined, he wants to leave and is indicating that he will sign out against medical advise. This M.D. recommended that the patient stay for further optimization of his lab values and also for a cardiology evaluation. Patient claims that he has refused AICD in the past, and claims that he does not want to be seen by any other cardiologist and will not consent to AICD placement. He understands the risk of sudden death syndrome. He also understands the risk of early discharge without optimization of his lab numbers that could lead to significant disability and death. He understands all these risks, and still wants to leave AMA. He claims he has an appointment coming up with his primary care practitioner, primary cardiologist. Have informed RN of patient's decision to sign out AMA.

## 2015-04-27 NOTE — Discharge Summary (Addendum)
PATIENT DETAILS Name: Jacob [name] Age: 64 y.o. Sex: male Date of Birth: December 30, 1950 MRN: [id number]. Admitting Physician: Albertine Patricia, MD [id number], Nicki Reaper, MD  Admit Date: 04/26/2015 Discharge date: 04/27/2015   Please note-patient signing out [place]  Recommendations for Outpatient Follow-up:  1. Have recommended patient discontinue metformin-given acidosis/ARF with CKD 2. Have recommended patient discontinue enalapril given CKD-hyperkalemia on presentation  PRIMARY DISCHARGE DIAGNOSIS:  Active Problems:   Human immunodeficiency virus (HIV) disease   Gout   Polysubstance abuse   Hyperkalemia   Acute renal failure   HTN (hypertension)   Syncope   DM (diabetes mellitus)      PAST MEDICAL HISTORY: Past Medical History  Diagnosis Date  . Syphilis, unspecified   . Coronary atherosclerosis of native coronary artery     s/p surgery  . Type II or unspecified type diabetes mellitus without mention of complication, not stated as uncontrolled 2004    Diagnosed in 2004. Came to our clinic in 5/07 and was on Glucotrol XL 10 mg BID and metformin 500 BID. Max A1C on those two meds was 6.9 on 03/10/08. He has been able to be removed from the meds while maintaining a good A1C. He has DM neuropathy (although neuropathy could be multifactorial.)  . Human immunodeficiency virus (HIV) disease   . Allergic rhinitis, cause unspecified   . Gout, unspecified   . Hyperlipidemia   . HTN (hypertension)   . Ischemic cardiomyopathy   . Drug abuse     hx; tested for cocaine as recently as 2/08. says he is not using drugs now - avoided defib. for this reason   . Acute maxillary sinusitis   . Backache, unspecified   . Coronary atherosclerosis of artery bypass graft   . Closed fracture of intertrochanteric section of femur   . Chronic kidney disease, unspecified   . Tobacco use disorder   . History of hepatitis C   . GERD (gastroesophageal reflux disease)   . Arthritis     . Hx of cardiovascular stress test     Lexiscan Myoview 3/16:  Lg ant-lateral and inf scar, no ischemia, EF 24%; high risk    DISCHARGE MEDICATIONS: Current Discharge Medication List    START taking these medications   Details  amLODipine (NORVASC) 5 MG tablet Take 1 tablet (5 mg total) by mouth daily. Qty: 30 tablet, Refills: 0    sodium bicarbonate 650 MG tablet Take 1 tablet (650 mg total) by mouth 2 (two) times daily. Qty: 60 tablet, Refills: 0      CONTINUE these medications which have NOT CHANGED   Details  allopurinol (ZYLOPRIM) 300 MG tablet Take 1 tablet (300 mg total) by mouth daily. Qty: 30 tablet, Refills: 6   Associated Diagnoses: Gout    ANORO ELLIPTA 62.5-25 MCG/INH AEPB Inhale 1 puff into the lungs daily. Refills: 0    atorvastatin (LIPITOR) 40 MG tablet Take 40 mg by mouth daily.     carvedilol (COREG) 25 MG tablet Take 1 tablet (25 mg total) by mouth 2 (two) times daily with a meal. Qty: 180 tablet, Refills: 3    hydrALAZINE (APRESOLINE) 25 MG tablet Take 1 tablet (25 mg total) by mouth 3 (three) times daily. Qty: 90 tablet, Refills: 6   Associated Diagnoses: Essential hypertension    isosorbide mononitrate (IMDUR) 30 MG 24 hr tablet Take 1 tablet (30 mg total) by mouth daily. Qty: 90 tablet, Refills: 3    LANTUS SOLOSTAR 100 UNIT/ML Solostar Pen  Inject 20 Units into the skin daily. diabetes    latanoprost (XALATAN) 0.005 % ophthalmic solution Place 1 drop into both eyes daily.    NAFTIN 2 % CREA Apply 1 application topically 2 (two) times daily.  Refills: 0    aspirin 81 MG tablet Take 81 mg by mouth daily.     Associated Diagnoses: DM (diabetes mellitus); Transaminitis    B-D UF III MINI PEN NEEDLES 31G X 5 MM MISC Refills: 0    colchicine 0.6 MG tablet Take 0.6 mg by mouth continuous as needed. For gout.    elvitegravir-cobicistat-emtricitabine-tenofovir (GENVOYA) 150-150-200-10 MG TABS tablet Take 1 tablet by mouth daily with breakfast. Qty:  30 tablet, Refills: 11   Associated Diagnoses: Human immunodeficiency virus (HIV) disease    Glucosamine-Chondroit-Vit C-Mn (GLUCOSAMINE CHONDR 1500 COMPLX PO) Take 2 tablets by mouth daily.     nitroGLYCERIN (NITROSTAT) 0.3 MG SL tablet Place 1 tablet (0.3 mg total) under the tongue every 5 (five) minutes as needed. Qty: 25 tablet, Refills: 3      STOP taking these medications     enalapril (VASOTEC) 10 MG tablet      metFORMIN (GLUCOPHAGE) 500 MG tablet      amoxicillin (AMOXIL) 500 MG capsule         ALLERGIES:   Allergies  Allergen Reactions  . Amphetamines Other (See Comments)    unknown    BRIEF HPI:  See H&P, Labs, Consult and Test reports for all details in brief, patient is a 64 year old male with history of ischemic cardiomyopathy-refused AICD in the past-admitted for evaluation of syncope. Patient does acknowledge to using cocaine 2 days prior to this admission, and also drinking significant amount of alcohol. He was found to have hyperkalemia, metabolic acidosis and worsening of his renal function and admitted to the hospitalist service.  CONSULTATIONS:   None  PERTINENT RADIOLOGIC STUDIES: Ct Head Wo Contrast  04/26/2015   CLINICAL DATA:  Syncopal episode.  Struck back of head.  EXAM: CT HEAD WITHOUT CONTRAST  TECHNIQUE: Contiguous axial images were obtained from the base of the skull through the vertex without intravenous contrast.  COMPARISON:  08/13/2004  FINDINGS: There is no intracranial hemorrhage or extra-axial fluid collection. There is mild generalized atrophy. There is mild hypodensity suggesting microvascular disease. Skull base and calvarium are intact.  IMPRESSION: Negative for acute intracranial traumatic injury. Mild generalized atrophy and chronic microvascular changes.   Electronically Signed   By: Andreas Newport M.D.   On: 04/26/2015 02:25     PERTINENT LAB RESULTS: CBC:  Recent Labs  04/26/15 1340 04/27/15 0323  WBC 7.0 7.9  HGB 13.9  13.0  HCT 40.6 37.4*  PLT 150 130*   CMET CMP     Component Value Date/Time   NA 137 04/27/2015 0323   K 4.8 04/27/2015 0323   CL 116* 04/27/2015 0323   CO2 14* 04/27/2015 0323   GLUCOSE 130* 04/27/2015 0323   BUN 26* 04/27/2015 0323   CREATININE 1.60* 04/27/2015 0323   CREATININE 1.54* 11/25/2014 0949   CALCIUM 8.2* 04/27/2015 0323   PROT 7.2 01/03/2015 1115   ALBUMIN 3.1* 01/03/2015 1115   AST 56* 01/03/2015 1115   ALT 46 01/03/2015 1115   ALKPHOS 71 01/03/2015 1115   BILITOT 0.5 01/03/2015 1115   GFRNONAA 43* 04/27/2015 0323   GFRNONAA 60 09/27/2011 1009   GFRAA 50* 04/27/2015 0323   GFRAA 70 09/27/2011 1009    GFR Estimated Creatinine Clearance: 52.6 mL/min (by C-G formula  based on Cr of 1.6). No results for input(s): LIPASE, AMYLASE in the last 72 hours.  Recent Labs  04/26/15 1340  CKTOTAL 226   Invalid input(s): POCBNP No results for input(s): DDIMER in the last 72 hours. No results for input(s): HGBA1C in the last 72 hours. No results for input(s): CHOL, HDL, LDLCALC, TRIG, CHOLHDL, LDLDIRECT in the last 72 hours. No results for input(s): TSH, T4TOTAL, T3FREE, THYROIDAB in the last 72 hours.  Invalid input(s): FREET3 No results for input(s): VITAMINB12, FOLATE, FERRITIN, TIBC, IRON, RETICCTPCT in the last 72 hours. Coags: No results for input(s): INR in the last 72 hours.  Invalid input(s): PT Microbiology: No results found for this or any previous visit (from the past 240 hour(s)).   BRIEF HOSPITAL COURSE:   Active Problems: Acute on chronic kidney disease stage III: Likely secondary to prerenal azotemia-in a setting of volume depletion, alcohol abuse, and ACE inhibitor use. Patient was hydrated, creatinine improved to 1.60, continues to be acidotic with no anion gap. Patient spoke with this M.D., and is adamant against staying further-he is subsequently signing out [place]. Risks of leaving the hospital prematurely was explained to the  patient in great detail-risk of death and disability was clearly explained to the patient, he is septic and all these risks and is signing out [place]. He claims he has an appointment with his PCP to follow-up later this week.  Hyperkalemia: Secondary to acute renal failure and ACE inhibitor use. Have discontinued ACE inhibitor and have informed the patient. Would be very cautious in the future to resume ACE inhibitor. Have asked him to follow-up with his PCP and have his labs drawn at his next visit.  Non Gapped Metabolic acidosis: Multifactorial,-likely worsened secondary to acute renal failure. Suspect could have RTA type IV at baseline as well. Have discontinued metformin, have recommended the patient stay on sodium bicarbonate tablets. Please repeat chemistries at next visit.  Syncope: Suspect this was secondary to orthostatic/volume depletion given presentation with acute renal failure. However he has a significantly low ejection fraction and has refused AICD placement in the past. This M.D. spoke with the patient and offered cardiology consultation, he however refuses to stay in the hospital and claims that he would still not consider pacing AICD at anytime now or in the future. Patient is aware risk of sudden death syndrome  Cocaine/alcohol abuse: Counseled extensively.  HTN:add Amlodipine-have discussed with patient regarding risks of cocaine abuse and beta blocker use. He claims he will not do cocaine any more.   Rest of his medical issues were relatively stable during this short hospitalization.  TODAY-DAY OF DISCHARGE:  Subjective:   [name] today has no headache,no chest abdominal pain,no new weakness tingling or numbness, feels much better wants to go home today. He subsequently is adamant on leaving today-and is signing out AGAINST MEDICAL ADVICE  Objective:   Blood pressure 195/98, pulse 76, temperature 98 F (36.7 C), temperature source Oral, resp. rate  18, height 6\' 6"  (1.981 m), weight 81.937 kg (180 lb 10.2 oz), SpO2 100 %.  Intake/Output Summary (Last 24 hours) at 04/27/15 0947 Last data filed at 04/27/15 0929  Gross per 24 hour  Intake   1060 ml  Output   2580 ml  Net  -1520 ml   Filed Weights   04/26/15 1253 04/27/15 0503  Weight: 82.1 kg (181 lb) 81.937 kg (180 lb 10.2 oz)    Exam Awake Alert, Oriented *3, No new F.N deficits,  Normal affect [place].AT,PERRAL Supple Neck,No JVD, No cervical lymphadenopathy appriciated.  Symmetrical Chest wall movement, Good air movement bilaterally, CTAB RRR,No Gallops,Rubs or new Murmurs, No Parasternal Heave +ve B.Sounds, Abd Soft, Non tender, No organomegaly appriciated, No rebound -guarding or rigidity. No Cyanosis, Clubbing or edema, No new Rash or bruise  DISCHARGE CONDITION: Stable  DISPOSITION: Home-AMA  DISCHARGE INSTRUCTIONS:    Activity:  As tolerated with Full fall precautions use walker/cane & assistance as needed  Diet recommendation: Diabetic Diet Heart Healthy diet  Discharge Instructions    (HEART FAILURE PATIENTS) Call MD:  Anytime you have any of the following symptoms: 1) 3 pound weight gain in 24 hours or 5 pounds in 1 week 2) shortness of breath, with or without a dry hacking cough 3) swelling in the hands, feet or stomach 4) if you have to sleep on extra pillows at night in order to breathe.    Complete by:  As directed      Diet - low sodium heart healthy    Complete by:  As directed      Diet Carb Modified    Complete by:  As directed      Increase activity slowly    Complete by:  As directed            Follow-up Information    Follow up with Velna Hatchet, MD.   Specialty:  Internal Medicine   Why:  Please make appointment for reapt potassium and Cr on Tues or wednesday.    Contact information:   [address] [place] [address] [phone]       Follow up with Jenkins Rouge, MD. Schedule an appointment as soon as possible for a visit in 1  week.   Specialty:  Cardiology   Contact information:   [id number] [place]  [address] [phone]      Total Time spent on discharge equals 30 minutes.  SignedOren Binet 04/27/2015 9:47 AM

## 2015-04-27 NOTE — Progress Notes (Signed)
                                                         AMA  Patient at this time expresses desire to leave the Hospital immidiately, patient has been warned that this is not Medically advisable at this time, and can result in Medical complications like Death and Disability, patient understands and accepts the risks involved and assumes full responsibilty of this decision.   Oren Binet M.D on 04/27/2015 at 9:28 AM  [facility]

## 2015-04-27 NOTE — Progress Notes (Signed)
At 1157 pt received d/c instructions as instructed by Dr.Gimire as pt left AMA.  Verbalized understanding.  Singed AMA  papers..  Ambulate off floor, escorted by his ride..  Declined w/c service.  Karie Kirks, Therapist, sports.

## 2015-04-28 LAB — HEMOGLOBIN A1C
Hgb A1c MFr Bld: 7.6 % — ABNORMAL HIGH (ref 4.8–5.6)
Mean Plasma Glucose: 171 mg/dL

## 2015-05-07 ENCOUNTER — Encounter: Payer: Self-pay | Admitting: Podiatry

## 2015-05-07 ENCOUNTER — Ambulatory Visit (INDEPENDENT_AMBULATORY_CARE_PROVIDER_SITE_OTHER): Payer: Medicare Other | Admitting: Podiatry

## 2015-05-07 DIAGNOSIS — Q828 Other specified congenital malformations of skin: Secondary | ICD-10-CM

## 2015-05-07 DIAGNOSIS — M79676 Pain in unspecified toe(s): Secondary | ICD-10-CM | POA: Diagnosis not present

## 2015-05-07 DIAGNOSIS — B351 Tinea unguium: Secondary | ICD-10-CM

## 2015-05-07 DIAGNOSIS — E119 Type 2 diabetes mellitus without complications: Secondary | ICD-10-CM

## 2015-05-07 NOTE — Progress Notes (Signed)
[name], male   DOB: 21-Dec-1950, 64 y.o.   MRN: [id number] Complaint:  Visit Type: Patient returns to my office for continued preventative foot care services. Complaint: Patient states" my nails have grown long and thick and become painful to walk and wear shoes" Patient has been diagnosed with DM with neuropathy. He presents for preventative foot care services. No changes to ROS.  He has painful calluses on bottom both feet.  Podiatric Exam: Vascular: dorsalis pedis and posterior tibial pulses are palpable bilateral. Capillary return is immediate. Temperature gradient is WNL. Skin turgor WNL  Sensorium: Normal Semmes Weinstein monofilament test. Normal tactile sensation bilaterally. Nail Exam: Pt has thick disfigured discolored nails with subungual debris noted bilateral entire nail hallux through fifth toenails Ulcer Exam: There is no evidence of ulcer or pre-ulcerative changes or infection. Orthopedic Exam: Muscle tone and strength are WNL. No limitations in general ROM. No crepitus or effusions noted. Foot type and digits show no abnormalities. Bony prominences are unremarkable. Severe HAV 1st MPJ B/L Skin:  Porokeratosis sub 5th met B/L.  Pinch callus right big toe.. No infection or ulcers  Diagnosis:  Tinea unguium, Pain in right toe, pain in left toes  Treatment & Plan Procedures and Treatment: Consent by patient was obtained for treatment procedures. The patient understood the discussion of treatment and procedures well. All questions were answered thoroughly reviewed. Debridement of mycotic and hypertrophic toenails, 1 through 5 bilateral and clearing of subungual debris. No ulceration, no infection noted.  Return Visit-Office Procedure: Patient instructed to return to the office for a follow up visit 3 months for continued evaluation and treatment.

## 2015-05-18 NOTE — Progress Notes (Signed)
[name], male   DOB: Mar 25, 1951, 65 y.o.   MRN: [id number]    Cardiology Office Note   Date:  05/18/2015   ID:  Jacob [name], DOB 1951-04-22, MRN [id number]  PCP:  Velna Hatchet, MD  Cardiologist:  Dr. Jenkins Rouge   Electrophysiologist:  Dr. Virl Axe   No chief complaint on file.    History of Present Illness: Jacob [name] is a 64 y.o. male with a hx of CAD, s/p CABG 2004, ischemic cardiomyopathy, HTN, HL, HIV, hepatitis C, CKD. Previously followed by Dr. Verl Blalock. Established with me in  10/2013. Cardiac MRI demonstrated EF < 35%. He was referred to Dr. Virl Axe for possible AICD. The patient ultimately declined implantation of ICD.   Seen in ED 3/12 with chest pain.  Symptoms were felt to be stable and outpatient workup recommended.  ED note indicates consult with Dr. Domenic Polite.  However, I cannot find a note from Dr. Domenic Polite in the chart (? Phone call).  He returns for FU.  The patient tells me that he's had left and right shoulder pain over the past month. It does radiate across his upper chest. He mainly has it at rest. He denies exertional symptoms. He has mild shortness of breath. He is NYHA 2. His dyspnea is not any worse. He denies exertional chest pain or jaw pain. He denies orthopnea PND or edema. He denies syncope. He had a recent URI. This is improved.  F/U myovue non ischemic  Large inferior and antero apical /lateral scars EF 24%  01/20/15   Studies: - LHC (04/2005): D1 occluded, proximal circumflex 70%, ostial RCA occluded, ramus branch of the circumflex occluded; SVG-ramus patent, SVG-RCA patent - Echo (08/2013): Mild LVH, EF 35-40%, inferior hypokinesis, grade 1 diastolic dysfunction, mild BAE. - Nuclear (01/13/14): Inferior and anterolateral infarct, no ischemia, EF 30%; high-risk   Cardiac MRI 11/2013 1) Moderate LVE with LVH EF 31% with multiple RWMAls as described above Consistent with multi vessel CAD 2) Full thickness scar  involving the lateral and inferolateral walls 3) Mild LAE 4) Normal RA/RV    Past Medical History  Diagnosis Date  . Syphilis, unspecified   . Coronary atherosclerosis of native coronary artery     s/p surgery  . Type II or unspecified type diabetes mellitus without mention of complication, not stated as uncontrolled 2004    Diagnosed in 2004. Came to our clinic in 5/07 and was on Glucotrol XL 10 mg BID and metformin 500 BID. Max A1C on those two meds was 6.9 on 03/10/08. He has been able to be removed from the meds while maintaining a good A1C. He has DM neuropathy (although neuropathy could be multifactorial.)  . Human immunodeficiency virus (HIV) disease   . Allergic rhinitis, cause unspecified   . Gout, unspecified   . Hyperlipidemia   . HTN (hypertension)   . Ischemic cardiomyopathy   . Drug abuse     hx; tested for cocaine as recently as 2/08. says he is not using drugs now - avoided defib. for this reason   . Acute maxillary sinusitis   . Backache, unspecified   . Coronary atherosclerosis of artery bypass graft   . Closed fracture of intertrochanteric section of femur   . Chronic kidney disease, unspecified   . Tobacco use disorder   . History of hepatitis C   . GERD (gastroesophageal reflux disease)   . Arthritis   . Hx of cardiovascular stress test  Lexiscan Myoview 3/16:  Lg ant-lateral and inf scar, no ischemia, EF 24%; high risk    Past Surgical History  Procedure Laterality Date  . Coronary artery bypass graft    . Left intertrochanteric hip fracture      s/p intermedullary nail placement 2/08  . Left transverse mandibular fracture  10/05  . Laproscopic cholecystectomy  8/07     Current Outpatient Prescriptions  Medication Sig Dispense Refill  . allopurinol (ZYLOPRIM) 300 MG tablet Take 1 tablet (300 mg total) by mouth daily. 30 tablet 6  . amLODipine (NORVASC) 5 MG tablet Take 1 tablet (5 mg total) by mouth daily. 30 tablet 0  . ANORO ELLIPTA 62.5-25  MCG/INH AEPB Inhale 1 puff into the lungs daily.  0  . aspirin 81 MG tablet Take 81 mg by mouth daily.      [name] atorvastatin (LIPITOR) 40 MG tablet Take 40 mg by mouth daily.     . B-D UF III MINI PEN NEEDLES 31G X 5 MM MISC   0  . carvedilol (COREG) 25 MG tablet Take 1 tablet (25 mg total) by mouth 2 (two) times daily with a meal. 180 tablet 3  . colchicine 0.6 MG tablet Take 0.6 mg by mouth continuous as needed. For gout.    [name] doxycycline (VIBRA-TABS) 100 MG tablet Take 100 mg by mouth 2 (two) times daily. for 10 days  0  . elvitegravir-cobicistat-emtricitabine-tenofovir (GENVOYA) 150-150-200-10 MG TABS tablet Take 1 tablet by mouth daily with breakfast. 30 tablet 11  . enalapril (VASOTEC) 10 MG tablet   0  . Glucosamine-Chondroit-Vit C-Mn (GLUCOSAMINE CHONDR 1500 COMPLX PO) Take 2 tablets by mouth daily.     . hydrALAZINE (APRESOLINE) 25 MG tablet Take 1 tablet (25 mg total) by mouth 3 (three) times daily. 90 tablet 6  . isosorbide mononitrate (IMDUR) 30 MG 24 hr tablet Take 1 tablet (30 mg total) by mouth daily. 90 tablet 3  . LANTUS SOLOSTAR 100 UNIT/ML Solostar Pen Inject 20 Units into the skin daily. diabetes    . latanoprost (XALATAN) 0.005 % ophthalmic solution Place 1 drop into both eyes daily.    . metFORMIN (GLUCOPHAGE) 500 MG tablet   0  . NAFTIN 2 % CREA Apply 1 application topically 2 (two) times daily.   0  . nitroGLYCERIN (NITROSTAT) 0.3 MG SL tablet Place 1 tablet (0.3 mg total) under the tongue every 5 (five) minutes as needed. 25 tablet 3  . sodium bicarbonate 650 MG tablet Take 1 tablet (650 mg total) by mouth 2 (two) times daily. 60 tablet 0  . [DISCONTINUED] calcium carbonate (OS-CAL) 600 MG TABS Take 600 mg by mouth 2 (two) times daily with a meal.       No current facility-administered medications for this visit.    Allergies:   Amphetamines    Social History:  The patient  reports that he has been smoking Cigarettes.  He has a 27 pack-year smoking history. He has  never used smokeless tobacco. He reports that he drinks alcohol. He reports that he uses illicit drugs (Cocaine).   Family History:  The patient's family history includes Alzheimer's disease in his mother; Diabetes in his brother; Drug abuse in his brother; Heart attack in his brother; Heart failure in his father; Hypertension in his brother and father; Stroke in his sister. There is no history of Colon cancer.    ROS:   Please see the history of present illness.   Review of Systems  Constitution:  Positive for diaphoresis.  Cardiovascular: Positive for chest pain and dyspnea on exertion.  Respiratory: Positive for cough.   Musculoskeletal: Positive for myalgias.  All other systems reviewed and are negative.    PHYSICAL EXAM: VS:  There were no vitals taken for this visit.    Wt Readings from Last 3 Encounters:  04/27/15 81.937 kg (180 lb 10.2 oz)  01/19/15 82.555 kg (182 lb)  01/07/15 84.369 kg (186 lb)     GEN: Well nourished, well developed, in no acute distress HEENT: normal Neck: no JVD, no masses Cardiac:  Normal S1/S2, RRR; no murmur ,  no rubs or gallops, no edema  Respiratory:  clear to auscultation bilaterally, no wheezing, rhonchi or rales. GI: soft, nontender, nondistended, + BS MS: no deformity or atrophy Skin: warm and dry  Neuro:  CNs II-XII intact, Strength and sensation are intact Psych: Normal affect   EKG:  March 2016    NSR, HR 81, inf Q waves, TWI in 1, aVL, V4-6, no changes   Recent Labs: 01/03/2015: ALT 46 04/27/2015: BUN 26*; Creatinine, Ser 1.60*; Hemoglobin 13.0; Platelets 130*; Potassium 4.8; Sodium 137    Lipid Panel    Component Value Date/Time   CHOL 106 11/25/2014 0949   TRIG 258* 11/25/2014 0949   HDL 25* 11/25/2014 0949   CHOLHDL 4.2 11/25/2014 0949   VLDL 52* 11/25/2014 0949   LDLCALC 29 11/25/2014 0949      ASSESSMENT AND PLAN:  Other chest pain Atypical f/u myovue non ischemic continue medical Rx   Coronary artery disease  involving native coronary artery of native heart without angina pectoris  Continue aspirin, statin, beta blocker, ACE inhibitor.  Ischemic cardiomyopathy He has refused ICD implantation past. Continue beta blocker, ACE inhibitor, hydralazine. We had a long discussion about maximizing his CHF medications. He does not take any PDE-5 inhibitors. Start isosorbide 30 mg daily.  Chronic combined systolic and diastolic congestive heart failure Volume stable. He is NYHA 2.  Essential hypertension Controlled.  HLD (hyperlipidemia)  Continue statin.  Current medicines are reviewed at length with the patient today.  The patient does not have concerns regarding medicines.  The following changes have been made:  As above   Labs/ tests ordered today include:   No orders of the defined types were placed in this encounter.    Disposition:   FU with me in 6 months    Signed, Versie Starks, MHS 05/18/2015 8:16 AM    [facility] [place], [place], [place]  [address] Phone: [phone]; Fax: [phone]

## 2015-05-20 ENCOUNTER — Ambulatory Visit (INDEPENDENT_AMBULATORY_CARE_PROVIDER_SITE_OTHER): Payer: Medicare Other | Admitting: Cardiovascular Disease

## 2015-05-20 ENCOUNTER — Encounter: Payer: Self-pay | Admitting: Cardiovascular Disease

## 2015-05-20 VITALS — BP 128/68 | HR 72 | Ht 74.0 in | Wt 185.5 lb

## 2015-05-20 DIAGNOSIS — I5022 Chronic systolic (congestive) heart failure: Secondary | ICD-10-CM | POA: Diagnosis not present

## 2015-05-20 NOTE — Patient Instructions (Signed)
Medication Instructions:  NO CHANGES  Labwork: NONE  Testing/Procedures: NONE  Follow-Up: Your physician wants you to follow-up in: 6 MONTHS WITH DR NISHAN You will receive a reminder letter in the mail two months in advance. If you don't receive a letter, please call our office to schedule the follow-up appointment.  Any Other Special Instructions Will Be Listed Below (If Applicable).   

## 2015-05-25 ENCOUNTER — Ambulatory Visit: Payer: Medicare Other

## 2015-06-17 ENCOUNTER — Ambulatory Visit (INDEPENDENT_AMBULATORY_CARE_PROVIDER_SITE_OTHER): Payer: Medicare Other | Admitting: Internal Medicine

## 2015-06-17 ENCOUNTER — Encounter: Payer: Self-pay | Admitting: Internal Medicine

## 2015-06-17 VITALS — BP 132/76 | HR 79 | Temp 98.4°F | Resp 20 | Ht 74.0 in | Wt 184.4 lb

## 2015-06-17 DIAGNOSIS — Z794 Long term (current) use of insulin: Secondary | ICD-10-CM | POA: Insufficient documentation

## 2015-06-17 DIAGNOSIS — E1122 Type 2 diabetes mellitus with diabetic chronic kidney disease: Secondary | ICD-10-CM | POA: Diagnosis not present

## 2015-06-17 DIAGNOSIS — B20 Human immunodeficiency virus [HIV] disease: Secondary | ICD-10-CM

## 2015-06-17 DIAGNOSIS — E1142 Type 2 diabetes mellitus with diabetic polyneuropathy: Secondary | ICD-10-CM | POA: Insufficient documentation

## 2015-06-17 DIAGNOSIS — N189 Chronic kidney disease, unspecified: Secondary | ICD-10-CM

## 2015-06-17 DIAGNOSIS — E875 Hyperkalemia: Secondary | ICD-10-CM | POA: Diagnosis not present

## 2015-06-17 DIAGNOSIS — E785 Hyperlipidemia, unspecified: Secondary | ICD-10-CM | POA: Diagnosis not present

## 2015-06-17 DIAGNOSIS — I10 Essential (primary) hypertension: Secondary | ICD-10-CM

## 2015-06-17 DIAGNOSIS — E1159 Type 2 diabetes mellitus with other circulatory complications: Secondary | ICD-10-CM | POA: Insufficient documentation

## 2015-06-17 DIAGNOSIS — I255 Ischemic cardiomyopathy: Secondary | ICD-10-CM | POA: Diagnosis not present

## 2015-06-17 MED ORDER — TELMISARTAN 20 MG PO TABS: 20.0000 mg | ORAL_TABLET | Freq: Every day | ORAL | Status: DC

## 2015-06-17 MED ORDER — COLCHICINE 0.6 MG PO TABS: 0.6000 mg | ORAL_TABLET | ORAL | Status: DC | PRN

## 2015-06-17 MED ORDER — GLIPIZIDE 5 MG PO TABS: 5.0000 mg | ORAL_TABLET | Freq: Two times a day (BID) | ORAL | Status: DC

## 2015-06-17 NOTE — Patient Instructions (Signed)
Increase lantus 24 units daily  STOP metformin and enalapril  Continue all other medications as ordered  Will call with lab results  Follow up in 1-2 mos for CPE  Keep appts with specialists as scheduled

## 2015-06-17 NOTE — Progress Notes (Signed)
[name], male   DOB: 1951-09-07, 64 y.o.   MRN: [id number]      Location:    [facility]   Place of Service:   OFFICE   Advanced Directive information Does patient have an advance directive?: Yes, Type of Advance Directive: [place];Living will, Does patient want to make changes to advanced directive?: No - Patient declined  Chief Complaint  Patient presents with  . Establish Care    New patient establish care  . Medical Management of Chronic Issues    HPI:  64 yo male seen today as a new pt. He fell  And hit his head 4th of July weekend and was taken to the ER. He was found to have hyperkalemia, CKD and polysubstance abuse. He left AMA after being admitted. He reports that he changed his friends and denies further illicit drug use.He is still drinking Etoh (3 beers, 24 oz each, per day).  DM - BS fluctuates and usually 170-190s. He is taking metformin BID.  HTN - BP stable on enalapril  HIV - mx by Dr [name]. He has an appt 9/6th.  He was unaware that he should not be taking enalapril, metformin.  Past Medical History  Diagnosis Date  . Syphilis, unspecified   . Coronary atherosclerosis of native coronary artery     s/p surgery  . Type II or unspecified type diabetes mellitus without mention of complication, not stated as uncontrolled 2004    Diagnosed in 2004. Came to our clinic in 5/07 and was on Glucotrol XL 10 mg BID and metformin 500 BID. Max A1C on those two meds was 6.9 on 03/10/08. He has been able to be removed from the meds while maintaining a good A1C. He has DM neuropathy (although neuropathy could be multifactorial.)  . Human immunodeficiency virus (HIV) disease   . Allergic rhinitis, cause unspecified   . Gout, unspecified   . Hyperlipidemia   . HTN (hypertension)   . Ischemic cardiomyopathy   . Drug abuse     hx; tested for cocaine as recently as 2/08. says he is not using drugs now - avoided defib. for this reason   . Acute  maxillary sinusitis   . Backache, unspecified   . Coronary atherosclerosis of artery bypass graft   . Closed fracture of intertrochanteric section of femur   . Chronic kidney disease, unspecified   . Tobacco use disorder   . History of hepatitis C   . GERD (gastroesophageal reflux disease)   . Arthritis   . Hx of cardiovascular stress test     Lexiscan Myoview 3/16:  Lg ant-lateral and inf scar, no ischemia, EF 24%; high risk    Past Surgical History  Procedure Laterality Date  . Coronary artery bypass graft    . Left intertrochanteric hip fracture  2004    s/p intermedullary nail placement 2/08  . Left transverse mandibular fracture  10/05  . Laproscopic cholecystectomy  8/07  . Colonoscopy  2013    [place]     Patient Care Team: Gildardo Cranker, DO as PCP - General (Internal Medicine) [name], MD as PCP - Infectious Diseases (Infectious Diseases) [name], MD as Consulting Physician (Ophthalmology) [name], MD as Consulting Physician (Cardiology)  Social History   Social History  . Marital Status: Single    Spouse Name: N/A  . Number of Children: N/A  . Years of Education: N/A   Occupational History  . Not on file.  Social History Main Topics  . Smoking status: Current Every Day Smoker -- 0.50 packs/day for 36 years    Types: Cigarettes  . Smokeless tobacco: Never Used  . Alcohol Use: 3.6 oz/week    6 Standard drinks or equivalent per week     Comment: 2 - 24 oz beers / week  . Drug Use: Yes    Special: Cocaine     Comment: none since 2008 (cocaine use)  . Sexual Activity:    Partners: Male     Comment: declined condoms   Other Topics Concern  . Not on file   Social History Narrative   Retired.       As of 05/2015:   Diet: No salt    Caffeine   Married: Single    House: Condo, 2 stories, 1 person (self)    Pets: No    Current/Past profession: N/A   Exercise: walks daily    Living Will: Yes    DNR   POA/HPOA: No              reports that he has been smoking Cigarettes.  He has a 18 pack-year smoking history. He has never used smokeless tobacco. He reports that he drinks about 3.6 oz of alcohol per week. He reports that he uses illicit drugs (Cocaine).  Family History  Problem Relation Age of Onset  . Heart failure Father   . Hypertension Father   . Diabetes Brother   . Heart attack Brother   . Colon cancer Neg Hx   . Alzheimer's disease Mother   . Stroke Sister   . Diabetes Sister   . Hypertension Brother   . Diabetes Brother   . Drug abuse Brother   . Alzheimer's disease Sister    Family Status  Relation Status Death Age  . Father Deceased     heart failure  . Brother Alive   . Brother Alive   . Mother Deceased   . Sister Alive   . Brother Deceased     MI; also did cocaine   . Sister Alive   . Sister Alive     Immunization History  Administered Date(s) Administered  . Hepatitis B 08/28/2006, 10/02/2007, 04/01/2008  . Hepatitis B, adult/adol-2 dose 06/03/2014, 07/04/2014  . Influenza Split 07/28/2011  . Influenza Whole 08/28/2006, 09/10/2007, 09/15/2008, 08/03/2009, 07/26/2010  . Influenza,inj,Quad PF,36+ Mos 07/04/2014  . Influenza-Unspecified 06/24/2013  . Pneumococcal Polysaccharide-23 08/28/2006, 07/28/2011  . Zoster 06/03/2014    Allergies  Allergen Reactions  . Amphetamines Other (See Comments)    unknown    Medications: Patient's Medications  New Prescriptions   No medications on file  Previous Medications   ALLOPURINOL (ZYLOPRIM) 300 MG TABLET    Take 1 tablet (300 mg total) by mouth daily.   ANORO ELLIPTA 62.5-25 MCG/INH AEPB    Inhale 1 puff into the lungs daily.   ASPIRIN 81 MG TABLET    Take 81 mg by mouth daily.     ATORVASTATIN (LIPITOR) 40 MG TABLET    Take 40 mg by mouth daily.    B-D UF III MINI PEN NEEDLES 31G X 5 MM MISC       CARVEDILOL (COREG) 25 MG TABLET    Take 1 tablet (25 mg total) by mouth 2 (two) times daily with a meal.   COLCHICINE 0.6 MG  TABLET    Take 0.6 mg by mouth continuous as needed. For gout.   ELVITEGRAVIR-COBICISTAT-EMTRICITABINE-TENOFOVIR (GENVOYA) 150-150-200-10 MG TABS TABLET  Take 1 tablet by mouth daily with breakfast.   ENALAPRIL (VASOTEC) 10 MG TABLET    Take 10 mg by mouth 2 (two) times daily.   GLUCOSAMINE-CHONDROIT-VIT C-MN (GLUCOSAMINE CHONDR 1500 COMPLX PO)    Take 2 tablets by mouth daily.    HYDRALAZINE (APRESOLINE) 25 MG TABLET    Take 1 tablet (25 mg total) by mouth 3 (three) times daily.   ISOSORBIDE MONONITRATE (IMDUR) 30 MG 24 HR TABLET    Take 30 mg by mouth daily.   LANTUS SOLOSTAR 100 UNIT/ML SOLOSTAR PEN    Inject 20 Units into the skin daily. diabetes   LATANOPROST (XALATAN) 0.005 % OPHTHALMIC SOLUTION    Place 1 drop into both eyes daily.   METFORMIN (GLUCOPHAGE) 500 MG TABLET    Take 500 mg by mouth 2 (two) times daily with a meal.    NAFTIN 2 % CREA    Apply 1 application topically 2 (two) times daily.    NAPROXEN SODIUM (ANAPROX) 220 MG TABLET    Take 220 mg by mouth as needed.    NITROGLYCERIN (NITROSTAT) 0.3 MG SL TABLET    Place 1 tablet (0.3 mg total) under the tongue every 5 (five) minutes as needed.   SODIUM CHLORIDE (OCEAN) 0.65 % SOLN NASAL SPRAY    Place 1 spray into both nostrils as needed for congestion.  Modified Medications   No medications on file  Discontinued Medications   No medications on file    Review of Systems  Constitutional: Negative for chills, activity change and fatigue.  HENT: Positive for dental problem and ear pain. Negative for sore throat and trouble swallowing.        Dry mouth  Eyes: Positive for visual disturbance.  Respiratory: Negative for cough, chest tightness and shortness of breath.   Cardiovascular: Negative for chest pain, palpitations and leg swelling.  Gastrointestinal: Positive for diarrhea and constipation. Negative for nausea, vomiting, abdominal pain and blood in stool.  Genitourinary: Negative for urgency, frequency and difficulty  urinating.  Musculoskeletal: Positive for arthralgias. Negative for gait problem.  Skin: Positive for wound. Negative for rash.  Neurological: Negative for weakness and headaches.  Psychiatric/Behavioral: Negative for confusion and sleep disturbance. The patient is nervous/anxious.     Filed Vitals:   06/17/15 1117  BP: 132/76  Pulse: 79  Temp: 98.4 F (36.9 C)  TempSrc: Oral  Resp: 20  Height: _0  (1.88 m)  Weight: 184 lb 6.4 oz (83.643 kg)  SpO2: 98%   Body mass index is 23.67 kg/(m^2).  Physical Exam  Constitutional: He is oriented to person, place, and time. He appears well-developed and well-nourished.  HENT:  Mouth/Throat: Oropharynx is clear and moist.  Eyes: Pupils are equal, round, and reactive to light. No scleral icterus.  Neck: Neck supple. Carotid bruit is not present. No thyromegaly present.  Cardiovascular: Normal rate, regular rhythm, normal heart sounds and intact distal pulses.  Exam reveals no gallop and no friction rub.   No murmur heard. no distal LE swelling. No calf TTP  Pulmonary/Chest: Effort normal and breath sounds normal. He has no wheezes. He has no rales. He exhibits no tenderness.  Abdominal: Soft. Bowel sounds are normal. He exhibits no distension, no abdominal bruit, no pulsatile midline mass and no mass. There is no tenderness. There is no rebound and no guarding.  Musculoskeletal: He exhibits edema and tenderness.  Lymphadenopathy:    He has no cervical adenopathy.  Neurological: He is alert and oriented to person, place,  and time.  Skin: Skin is warm and dry. No rash noted.  Psychiatric: He has a normal mood and affect. His behavior is normal. Judgment and thought content normal.     Labs reviewed: Admission on 04/26/2015, Discharged on 04/27/2015  Component Date Value Ref Range Status  . Glucose-Capillary 04/26/2015 193* 65 - 99 mg/dL Final  . WBC 04/26/2015 6.6  4.0 - 10.5 K/uL Final  . RBC 04/26/2015 4.70  4.22 - 5.81 MIL/uL Final   . Hemoglobin 04/26/2015 15.3  13.0 - 17.0 g/dL Final  . HCT 04/26/2015 45.3  39.0 - 52.0 % Final  . MCV 04/26/2015 96.4  78.0 - 100.0 fL Final  . MCH 04/26/2015 32.6  26.0 - 34.0 pg Final  . MCHC 04/26/2015 33.8  30.0 - 36.0 g/dL Final  . RDW 04/26/2015 14.6  11.5 - 15.5 % Final  . Platelets 04/26/2015 166  150 - 400 K/uL Final  . Sodium 04/26/2015 140  135 - 145 mmol/L Final  . Potassium 04/26/2015 6.4* 3.5 - 5.1 mmol/L Final  . Chloride 04/26/2015 113* 101 - 111 mmol/L Final  . BUN 04/26/2015 39* 6 - 20 mg/dL Final  . Creatinine, Ser 04/26/2015 2.90* 0.61 - 1.24 mg/dL Final  . Glucose, Bld 04/26/2015 194* 65 - 99 mg/dL Final  . Calcium, Ion 04/26/2015 1.22  1.13 - 1.30 mmol/L Final  . TCO2 04/26/2015 14  0 - 100 mmol/L Final  . Hemoglobin 04/26/2015 16.0  13.0 - 17.0 g/dL Final  . HCT 04/26/2015 47.0  39.0 - 52.0 % Final  . Comment 04/26/2015 NOTIFIED PHYSICIAN   Final  . Sodium 04/26/2015 137  135 - 145 mmol/L Final  . Potassium 04/26/2015 6.5* 3.5 - 5.1 mmol/L Final   Comment: REPEATED TO VERIFY SLIGHT HEMOLYSIS CRITICAL RESULT CALLED TO, READ BACK BY AND VERIFIED WITH: B JACOBELLIE,RN [id number] [place]   . Chloride 04/26/2015 114* 101 - 111 mmol/L Final  . CO2 04/26/2015 17* 22 - 32 mmol/L Final  . Glucose, Bld 04/26/2015 202* 65 - 99 mg/dL Final  . BUN 04/26/2015 34* 6 - 20 mg/dL Final  . Creatinine, Ser 04/26/2015 2.63* 0.61 - 1.24 mg/dL Final  . Calcium 04/26/2015 8.7* 8.9 - 10.3 mg/dL Final  . GFR calc non Af Amer 04/26/2015 24* >60 mL/min Final  . GFR calc Af Amer 04/26/2015 28* >60 mL/min Final   Comment: (NOTE) The eGFR has been calculated using the CKD EPI equation. This calculation has not been validated in all clinical situations. eGFR's persistently <60 mL/min signify possible Chronic Kidney Disease.   . Anion gap 04/26/2015 6  5 - 15 Final  . ABO/RH(D) 04/26/2015 A POS   Final  . Antibody Screen 04/26/2015 NEG   Final  . Sample Expiration 04/26/2015  04/29/2015   Final  . ABO/RH(D) 04/26/2015 A POS   Final  . Sodium 04/26/2015 137  135 - 145 mmol/L Final  . Potassium 04/26/2015 7.5* 3.5 - 5.1 mmol/L Final   Comment: REPEATED TO VERIFY SLIGHT HEMOLYSIS CRITICAL RESULT CALLED TO, READ BACK BY AND VERIFIED WITH: B JACOBELLIE,RN [id number] [place]   . Chloride 04/26/2015 112* 101 - 111 mmol/L Final  . CO2 04/26/2015 15* 22 - 32 mmol/L Final  . Glucose, Bld 04/26/2015 212* 65 - 99 mg/dL Final  . BUN 04/26/2015 36* 6 - 20 mg/dL Final  . Creatinine, Ser 04/26/2015 2.69* 0.61 - 1.24 mg/dL Final  . Calcium 04/26/2015 8.8* 8.9 - 10.3 mg/dL Final  . GFR calc non  Af Amer 04/26/2015 23* >60 mL/min Final  . GFR calc Af Amer 04/26/2015 27* >60 mL/min Final   Comment: (NOTE) The eGFR has been calculated using the CKD EPI equation. This calculation has not been validated in all clinical situations. eGFR's persistently <60 mL/min signify possible Chronic Kidney Disease.   . Anion gap 04/26/2015 10  5 - 15 Final  . Potassium 04/26/2015 7.0* 3.5 - 5.1 mmol/L Final   Comment: SLIGHT HEMOLYSIS REPEATED TO VERIFY CRITICAL RESULT CALLED TO, READ BACK BY AND VERIFIED WITH: B JACOBELLIE,RN [id number] [place]   . Sodium 04/26/2015 140  135 - 145 mmol/L Final  . Potassium 04/26/2015 6.0* 3.5 - 5.1 mmol/L Final  . Chloride 04/26/2015 118* 101 - 111 mmol/L Final  . CO2 04/26/2015 15* 22 - 32 mmol/L Final  . Glucose, Bld 04/26/2015 155* 65 - 99 mg/dL Final  . BUN 04/26/2015 36* 6 - 20 mg/dL Final  . Creatinine, Ser 04/26/2015 2.24* 0.61 - 1.24 mg/dL Final  . Calcium 04/26/2015 8.1* 8.9 - 10.3 mg/dL Final  . GFR calc non Af Amer 04/26/2015 29* >60 mL/min Final  . GFR calc Af Amer 04/26/2015 33* >60 mL/min Final   Comment: (NOTE) The eGFR has been calculated using the CKD EPI equation. This calculation has not been validated in all clinical situations. eGFR's persistently <60 mL/min signify possible Chronic Kidney Disease.   . Anion gap 04/26/2015  7  5 - 15 Final  . Color, Urine 04/26/2015 YELLOW  YELLOW Final  . APPearance 04/26/2015 CLEAR  CLEAR Final  . Specific Gravity, Urine 04/26/2015 1.012  1.005 - 1.030 Final  . pH 04/26/2015 5.0  5.0 - 8.0 Final  . Glucose, UA 04/26/2015 NEGATIVE  NEGATIVE mg/dL Final  . Hgb urine dipstick 04/26/2015 NEGATIVE  NEGATIVE Final  . Bilirubin Urine 04/26/2015 NEGATIVE  NEGATIVE Final  . Ketones, ur 04/26/2015 NEGATIVE  NEGATIVE mg/dL Final  . Protein, ur 04/26/2015 100* NEGATIVE mg/dL Final  . Urobilinogen, UA 04/26/2015 0.2  0.0 - 1.0 mg/dL Final  . Nitrite 04/26/2015 NEGATIVE  NEGATIVE Final  . Leukocytes, UA 04/26/2015 NEGATIVE  NEGATIVE Final  . Opiates 04/26/2015 NONE DETECTED  NONE DETECTED Final  . Cocaine 04/26/2015 POSITIVE* NONE DETECTED Final  . Benzodiazepines 04/26/2015 NONE DETECTED  NONE DETECTED Final  . Amphetamines 04/26/2015 NONE DETECTED  NONE DETECTED Final  . Tetrahydrocannabinol 04/26/2015 NONE DETECTED  NONE DETECTED Final  . Barbiturates 04/26/2015 NONE DETECTED  NONE DETECTED Final   Comment:        DRUG SCREEN FOR MEDICAL PURPOSES ONLY.  IF CONFIRMATION IS NEEDED FOR ANY PURPOSE, NOTIFY LAB WITHIN 5 DAYS.        LOWEST DETECTABLE LIMITS FOR URINE DRUG SCREEN Drug Class       Cutoff (ng/mL) Amphetamine      1000 Barbiturate      200 Benzodiazepine   638 Tricyclics       466 Opiates          300 Cocaine          300 THC              50   . Sodium 04/26/2015 140  135 - 145 mmol/L Final  . Potassium 04/26/2015 5.6* 3.5 - 5.1 mmol/L Final  . Chloride 04/26/2015 115* 101 - 111 mmol/L Final  . CO2 04/26/2015 17* 22 - 32 mmol/L Final  . Glucose, Bld 04/26/2015 222* 65 - 99 mg/dL Final  . BUN 04/26/2015 33* 6 - 20  mg/dL Final  . Creatinine, Ser 04/26/2015 2.24* 0.61 - 1.24 mg/dL Final  . Calcium 04/26/2015 8.5* 8.9 - 10.3 mg/dL Final  . GFR calc non Af Amer 04/26/2015 29* >60 mL/min Final  . GFR calc Af Amer 04/26/2015 33* >60 mL/min Final   Comment:  (NOTE) The eGFR has been calculated using the CKD EPI equation. This calculation has not been validated in all clinical situations. eGFR's persistently <60 mL/min signify possible Chronic Kidney Disease.   . Anion gap 04/26/2015 8  5 - 15 Final  . Total CK 04/26/2015 226  49 - 397 U/L Final  . Glucose-Capillary 04/26/2015 146* 65 - 99 mg/dL Final  . WBC 04/26/2015 7.0  4.0 - 10.5 K/uL Final  . RBC 04/26/2015 4.23  4.22 - 5.81 MIL/uL Final  . Hemoglobin 04/26/2015 13.9  13.0 - 17.0 g/dL Final  . HCT 04/26/2015 40.6  39.0 - 52.0 % Final  . MCV 04/26/2015 96.0  78.0 - 100.0 fL Final  . MCH 04/26/2015 32.9  26.0 - 34.0 pg Final  . MCHC 04/26/2015 34.2  30.0 - 36.0 g/dL Final  . RDW 04/26/2015 14.9  11.5 - 15.5 % Final  . Platelets 04/26/2015 150  150 - 400 K/uL Final  . Hgb A1c MFr Bld 04/26/2015 7.6* 4.8 - 5.6 % Final   Comment: (NOTE)         Pre-diabetes: 5.7 - 6.4         Diabetes: >6.4         Glycemic control for adults with diabetes: <7.0   . Mean Plasma Glucose 04/26/2015 171   Final   Comment: (NOTE) Performed At: [facility] [place], [place] [id number] Lindon Romp MD SF:[id number]   . Sodium 04/27/2015 137  135 - 145 mmol/L Final  . Potassium 04/27/2015 4.8  3.5 - 5.1 mmol/L Final  . Chloride 04/27/2015 116* 101 - 111 mmol/L Final  . CO2 04/27/2015 14* 22 - 32 mmol/L Final  . Glucose, Bld 04/27/2015 130* 65 - 99 mg/dL Final  . BUN 04/27/2015 26* 6 - 20 mg/dL Final  . Creatinine, Ser 04/27/2015 1.60* 0.61 - 1.24 mg/dL Final  . Calcium 04/27/2015 8.2* 8.9 - 10.3 mg/dL Final  . GFR calc non Af Amer 04/27/2015 43* >60 mL/min Final  . GFR calc Af Amer 04/27/2015 50* >60 mL/min Final   Comment: (NOTE) The eGFR has been calculated using the CKD EPI equation. This calculation has not been validated in all clinical situations. eGFR's persistently <60 mL/min signify possible Chronic Kidney Disease.   . Anion gap 04/27/2015 7  5 - 15 Final  . WBC  04/27/2015 7.9  4.0 - 10.5 K/uL Final  . RBC 04/27/2015 3.98* 4.22 - 5.81 MIL/uL Final  . Hemoglobin 04/27/2015 13.0  13.0 - 17.0 g/dL Final  . HCT 04/27/2015 37.4* 39.0 - 52.0 % Final  . MCV 04/27/2015 94.0  78.0 - 100.0 fL Final  . MCH 04/27/2015 32.7  26.0 - 34.0 pg Final  . MCHC 04/27/2015 34.8  30.0 - 36.0 g/dL Final  . RDW 04/27/2015 14.7  11.5 - 15.5 % Final  . Platelets 04/27/2015 130* 150 - 400 K/uL Final  . Glucose-Capillary 04/26/2015 155* 65 - 99 mg/dL Final  . Squamous Epithelial / LPF 04/26/2015 RARE  RARE Final  . WBC, UA 04/26/2015 0-2  <3 WBC/hpf Final  . Casts 04/26/2015 GRANULAR CAST* NEGATIVE Final  . Glucose-Capillary 04/26/2015 124* 65 - 99 mg/dL Final  . Glucose-Capillary  04/27/2015 136* 65 - 99 mg/dL Final    No results found.   Assessment/Plan   ICD-9-CM ICD-10-CM   1. Hyperkalemia 276.7 P37.9 Basic Metabolic Panel  2. Type 2 diabetes mellitus with diabetic chronic kidney disease with hyperglycemia 250.40 E11.22 glipiZIDE (GLUCOTROL) 5 MG tablet   585.9 N18.9 telmisartan (MICARDIS) 20 MG tablet     Basic Metabolic Panel  3. Ischemic cardiomyopathy - asymptomatic 414.8 I25.5   4. Essential hypertension - stable 401.9 I10 telmisartan (MICARDIS) 20 MG tablet  5. HLD (hyperlipidemia) - stable 272.4 E78.5   6. Human immunodeficiency virus (HIV) disease - stable 042 B20     Increase lantus 24 units daily  STOP metformin and enalapril  Continue all other medications as ordered  Will call with lab results  Follow up in 1-2 mos for CPE  Keep appts with specialists as scheduled  Idy Rawling S. Perlie Gold  [facility] [address] [place], [place] [address] [phone] Cell (Monday-Friday 8 AM - 5 PM) [phone] After 5 PM and follow prompts

## 2015-06-18 ENCOUNTER — Other Ambulatory Visit: Payer: Self-pay | Admitting: *Deleted

## 2015-06-18 DIAGNOSIS — M1000 Idiopathic gout, unspecified site: Secondary | ICD-10-CM

## 2015-06-18 LAB — BASIC METABOLIC PANEL
BUN / CREAT RATIO: 20 (ref 10–22)
BUN: 33 mg/dL — ABNORMAL HIGH (ref 8–27)
CO2: 18 mmol/L (ref 18–29)
CREATININE: 1.69 mg/dL — AB (ref 0.76–1.27)
Calcium: 9.2 mg/dL (ref 8.6–10.2)
Chloride: 103 mmol/L (ref 97–108)
GFR calc Af Amer: 48 mL/min/{1.73_m2} — ABNORMAL LOW (ref >59)
GFR calc non Af Amer: 41 mL/min/{1.73_m2} — ABNORMAL LOW (ref >59)
Glucose: 138 mg/dL — ABNORMAL HIGH (ref 65–99)
POTASSIUM: 6.3 mmol/L — AB (ref 3.5–5.2)
Sodium: 135 mmol/L (ref 134–144)

## 2015-06-18 MED ORDER — SODIUM POLYSTYRENE SULFONATE 15 GM/60ML PO SUSP: 60.0000 g | Freq: Once | ORAL | Status: DC

## 2015-06-18 MED ORDER — ALLOPURINOL 300 MG PO TABS: 300.0000 mg | ORAL_TABLET | Freq: Every day | ORAL | Status: DC

## 2015-06-19 ENCOUNTER — Telehealth: Payer: Self-pay

## 2015-06-19 DIAGNOSIS — E870 Hyperosmolality and hypernatremia: Secondary | ICD-10-CM

## 2015-06-19 NOTE — Telephone Encounter (Signed)
Patient drunk the kayexelate yesterday and it gave him diarrhea today. He will be in Monday for lab work.

## 2015-06-22 ENCOUNTER — Other Ambulatory Visit: Payer: Medicare Other

## 2015-06-22 ENCOUNTER — Telehealth: Payer: Self-pay

## 2015-06-22 DIAGNOSIS — E870 Hyperosmolality and hypernatremia: Secondary | ICD-10-CM | POA: Diagnosis not present

## 2015-06-22 NOTE — Telephone Encounter (Signed)
Patient was in office and mentioned in conversation with the Lab Tech that over the weekend he was having chest pain, tingling in ankle and swelling.  Patient was in office today for a recheck on potassium level. This information was not shared with the triage assistant or any of the medical assistants until after patient had left.   Please advise if labs should be sen STAT or give any other recommendations

## 2015-06-22 NOTE — Telephone Encounter (Signed)
If he is having CP, he needs to be evaluated in the ER. Otherwise, will wait on labs as ordered

## 2015-06-23 LAB — BASIC METABOLIC PANEL
BUN / CREAT RATIO: 23 — AB (ref 10–22)
BUN: 44 mg/dL — ABNORMAL HIGH (ref 8–27)
CHLORIDE: 105 mmol/L (ref 97–108)
CO2: 16 mmol/L — ABNORMAL LOW (ref 18–29)
Calcium: 9.4 mg/dL (ref 8.6–10.2)
Creatinine, Ser: 1.9 mg/dL — ABNORMAL HIGH (ref 0.76–1.27)
GFR calc Af Amer: 42 mL/min/{1.73_m2} — ABNORMAL LOW (ref >59)
GFR calc non Af Amer: 36 mL/min/{1.73_m2} — ABNORMAL LOW (ref >59)
GLUCOSE: 256 mg/dL — AB (ref 65–99)
Potassium: 5.2 mmol/L (ref 3.5–5.2)
SODIUM: 137 mmol/L (ref 134–144)

## 2015-06-23 MED ORDER — INSULIN GLARGINE 100 UNIT/ML SOLOSTAR PEN: 24.0000 [IU] | PEN_INJECTOR | Freq: Every day | SUBCUTANEOUS | Status: DC

## 2015-06-23 NOTE — Telephone Encounter (Signed)
Patient stated that he has not had anymore chest pains since. I instructed him that if he has anymore chest pains or any new symptoms he needs to go to the ER to be evaluated. He agreed. He also stated that he needed a refill on his Solostar Pen. Faxed to pharmacy.

## 2015-06-24 ENCOUNTER — Other Ambulatory Visit: Payer: Self-pay | Admitting: *Deleted

## 2015-06-24 ENCOUNTER — Encounter (HOSPITAL_COMMUNITY): Payer: Self-pay | Admitting: *Deleted

## 2015-06-24 ENCOUNTER — Telehealth: Payer: Self-pay | Admitting: *Deleted

## 2015-06-24 DIAGNOSIS — I129 Hypertensive chronic kidney disease with stage 1 through stage 4 chronic kidney disease, or unspecified chronic kidney disease: Secondary | ICD-10-CM | POA: Diagnosis not present

## 2015-06-24 DIAGNOSIS — Z79899 Other long term (current) drug therapy: Secondary | ICD-10-CM | POA: Insufficient documentation

## 2015-06-24 DIAGNOSIS — Z8719 Personal history of other diseases of the digestive system: Secondary | ICD-10-CM | POA: Insufficient documentation

## 2015-06-24 DIAGNOSIS — Z21 Asymptomatic human immunodeficiency virus [HIV] infection status: Secondary | ICD-10-CM | POA: Diagnosis not present

## 2015-06-24 DIAGNOSIS — E785 Hyperlipidemia, unspecified: Secondary | ICD-10-CM | POA: Insufficient documentation

## 2015-06-24 DIAGNOSIS — Z951 Presence of aortocoronary bypass graft: Secondary | ICD-10-CM | POA: Diagnosis not present

## 2015-06-24 DIAGNOSIS — Z7982 Long term (current) use of aspirin: Secondary | ICD-10-CM | POA: Diagnosis not present

## 2015-06-24 DIAGNOSIS — M1990 Unspecified osteoarthritis, unspecified site: Secondary | ICD-10-CM | POA: Insufficient documentation

## 2015-06-24 DIAGNOSIS — M79605 Pain in left leg: Secondary | ICD-10-CM | POA: Insufficient documentation

## 2015-06-24 DIAGNOSIS — I2510 Atherosclerotic heart disease of native coronary artery without angina pectoris: Secondary | ICD-10-CM | POA: Insufficient documentation

## 2015-06-24 DIAGNOSIS — N189 Chronic kidney disease, unspecified: Secondary | ICD-10-CM | POA: Insufficient documentation

## 2015-06-24 DIAGNOSIS — Z8709 Personal history of other diseases of the respiratory system: Secondary | ICD-10-CM | POA: Diagnosis not present

## 2015-06-24 DIAGNOSIS — R358 Other polyuria: Secondary | ICD-10-CM | POA: Diagnosis not present

## 2015-06-24 DIAGNOSIS — E1165 Type 2 diabetes mellitus with hyperglycemia: Secondary | ICD-10-CM | POA: Insufficient documentation

## 2015-06-24 DIAGNOSIS — M109 Gout, unspecified: Secondary | ICD-10-CM | POA: Diagnosis not present

## 2015-06-24 DIAGNOSIS — Z8619 Personal history of other infectious and parasitic diseases: Secondary | ICD-10-CM | POA: Diagnosis not present

## 2015-06-24 DIAGNOSIS — E1122 Type 2 diabetes mellitus with diabetic chronic kidney disease: Secondary | ICD-10-CM

## 2015-06-24 DIAGNOSIS — Z794 Long term (current) use of insulin: Secondary | ICD-10-CM | POA: Insufficient documentation

## 2015-06-24 DIAGNOSIS — R631 Polydipsia: Secondary | ICD-10-CM | POA: Insufficient documentation

## 2015-06-24 DIAGNOSIS — Z8781 Personal history of (healed) traumatic fracture: Secondary | ICD-10-CM | POA: Diagnosis not present

## 2015-06-24 DIAGNOSIS — M79604 Pain in right leg: Secondary | ICD-10-CM | POA: Diagnosis not present

## 2015-06-24 DIAGNOSIS — Z720 Tobacco use: Secondary | ICD-10-CM | POA: Diagnosis not present

## 2015-06-24 DIAGNOSIS — I10 Essential (primary) hypertension: Secondary | ICD-10-CM

## 2015-06-24 LAB — CBG MONITORING, ED: Glucose-Capillary: 352 mg/dL — ABNORMAL HIGH (ref 65–99)

## 2015-06-24 MED ORDER — HYDRALAZINE HCL 25 MG PO TABS: 25.0000 mg | ORAL_TABLET | Freq: Three times a day (TID) | ORAL | Status: DC

## 2015-06-24 MED ORDER — CARVEDILOL 25 MG PO TABS: 25.0000 mg | ORAL_TABLET | Freq: Two times a day (BID) | ORAL | Status: DC

## 2015-06-24 NOTE — Telephone Encounter (Signed)
Patient called and stated that he was seen 8/24 and his diabetic medication was changed from Metformin to Glipizide and his lowest Blood Sugar has been 171 and Highest 297 and he wants to know if this is because of the medication change. Has been taking his insulin also. He spoke with the [facility] nurse stated that his medication probably just hasn't taken effect yet. He also spoke with the heart failure support program, Gifford Shave and she stated the same thing.  Please Advise.  06/20/15  260 am   171 pm 06/21/15  213 am   206 pm 06/22/15  275 am   271 pm 06/23/15  288 am   250 pm 06/24/15  297 am

## 2015-06-24 NOTE — Telephone Encounter (Signed)
It does take time for med to work. Recommend he increase glipizide to 10mg  in AM and 5mg  in PM; increase Lantus 28 units daily. F/u as scheduled

## 2015-06-24 NOTE — ED Notes (Signed)
Patient presents stating his sugar has been running high.  Meds have been changed and since then his sugar has been running high

## 2015-06-25 ENCOUNTER — Emergency Department (HOSPITAL_COMMUNITY)
Admission: EM | Admit: 2015-06-25 | Discharge: 2015-06-25 | Disposition: A | Discharge status: Home / Self Care | Payer: Medicare Other | Attending: Emergency Medicine | Admitting: Emergency Medicine

## 2015-06-25 DIAGNOSIS — R739 Hyperglycemia, unspecified: Secondary | ICD-10-CM

## 2015-06-25 LAB — BASIC METABOLIC PANEL
Anion gap: 7 (ref 5–15)
BUN: 34 mg/dL — ABNORMAL HIGH (ref 6–20)
CHLORIDE: 108 mmol/L (ref 101–111)
CO2: 19 mmol/L — AB (ref 22–32)
CREATININE: 1.98 mg/dL — AB (ref 0.61–1.24)
Calcium: 8.8 mg/dL — ABNORMAL LOW (ref 8.9–10.3)
GFR calc Af Amer: 39 mL/min — ABNORMAL LOW (ref >60)
GFR calc non Af Amer: 33 mL/min — ABNORMAL LOW (ref >60)
GLUCOSE: 361 mg/dL — AB (ref 65–99)
Potassium: 4.6 mmol/L (ref 3.5–5.1)
Sodium: 134 mmol/L — ABNORMAL LOW (ref 135–145)

## 2015-06-25 LAB — CBG MONITORING, ED
GLUCOSE-CAPILLARY: 313 mg/dL — AB (ref 65–99)
Glucose-Capillary: 216 mg/dL — ABNORMAL HIGH (ref 65–99)

## 2015-06-25 LAB — CBC
HCT: 37.5 % — ABNORMAL LOW (ref 39.0–52.0)
HEMOGLOBIN: 13.2 g/dL (ref 13.0–17.0)
MCH: 33.1 pg (ref 26.0–34.0)
MCHC: 35.2 g/dL (ref 30.0–36.0)
MCV: 94 fL (ref 78.0–100.0)
Platelets: 152 10*3/uL (ref 150–400)
RBC: 3.99 MIL/uL — AB (ref 4.22–5.81)
RDW: 13.6 % (ref 11.5–15.5)
WBC: 5.2 10*3/uL (ref 4.0–10.5)

## 2015-06-25 LAB — URINALYSIS, ROUTINE W REFLEX MICROSCOPIC
Bilirubin Urine: NEGATIVE
Glucose, UA: >1000 mg/dL — AB
HGB URINE DIPSTICK: NEGATIVE
Ketones, ur: NEGATIVE mg/dL
Leukocytes, UA: NEGATIVE
Nitrite: NEGATIVE
PH: 5.5 (ref 5.0–8.0)
Protein, ur: 100 mg/dL — AB
SPECIFIC GRAVITY, URINE: 1.023 (ref 1.005–1.030)
UROBILINOGEN UA: 0.2 mg/dL (ref 0.0–1.0)

## 2015-06-25 LAB — URINE MICROSCOPIC-ADD ON

## 2015-06-25 MED ORDER — GLIPIZIDE 5 MG PO TABS: ORAL_TABLET | ORAL | Status: DC

## 2015-06-25 MED ORDER — INSULIN ASPART 100 UNIT/ML ~~LOC~~ SOLN
5.0000 [IU] | Freq: Once | SUBCUTANEOUS | Status: AC
  Administered 2015-06-25: 5 [IU] via SUBCUTANEOUS
  Filled 2015-06-25: qty 1

## 2015-06-25 MED ORDER — INSULIN GLARGINE 100 UNIT/ML SOLOSTAR PEN: 28.0000 [IU] | PEN_INJECTOR | Freq: Every day | SUBCUTANEOUS | Status: DC

## 2015-06-25 MED ORDER — SODIUM CHLORIDE 0.9 % IV BOLUS (SEPSIS)
1000.0000 mL | Freq: Once | INTRAVENOUS | Status: AC
  Administered 2015-06-25: 1000 mL via INTRAVENOUS

## 2015-06-25 NOTE — ED Provider Notes (Signed)
CSN: [id number]     Arrival date & time 06/24/15  2307 History  This chart was scribed for Jola Schmidt, MD by Meriel Pica, ED Scribe. This patient was seen in room A09C/A09C and the patient's care was started 2:49 AM.   Chief Complaint  Patient presents with  . Hyperglycemia   The history is provided by the patient. No language interpreter was used.   HPI Comments: Jacob [name] is a 64 y.o. male, with a PMhx of DM, who presents to the Emergency Department complaining of sudden onset, gradually worsening hyperglycemia reading at 390 PTA that began today. Pt reports associated polyuria, polydipsia, and a tightness in bilateral feet.  The pt notes a recent change in diabetic medication on 8/24 when he was taken of metformin and enalapril and started on glipizide 5mg . He also states his lantus was increased from 20mg  to 24mg . The pt additionally reports his potassium was high on his last PCP visit. He was taking Kayexalate to lower K+ levels over the weekend and notes 3 days ago his K+ was 5.2 during PCP visit. He denies recent diet changes, SOB, CP .   Past Medical History  Diagnosis Date  . Syphilis, unspecified   . Coronary atherosclerosis of native coronary artery     s/p surgery  . Type II or unspecified type diabetes mellitus without mention of complication, not stated as uncontrolled 2004    Diagnosed in 2004. Came to our clinic in 5/07 and was on Glucotrol XL 10 mg BID and metformin 500 BID. Max A1C on those two meds was 6.9 on 03/10/08. He has been able to be removed from the meds while maintaining a good A1C. He has DM neuropathy (although neuropathy could be multifactorial.)  . Human immunodeficiency virus (HIV) disease   . Allergic rhinitis, cause unspecified   . Gout, unspecified   . Hyperlipidemia   . HTN (hypertension)   . Ischemic cardiomyopathy   . Drug abuse     hx; tested for cocaine as recently as 2/08. says he is not using drugs now - avoided defib. for this reason    . Acute maxillary sinusitis   . Backache, unspecified   . Coronary atherosclerosis of artery bypass graft   . Closed fracture of intertrochanteric section of femur   . Chronic kidney disease, unspecified   . Tobacco use disorder   . History of hepatitis C   . GERD (gastroesophageal reflux disease)   . Arthritis   . Hx of cardiovascular stress test     Lexiscan Myoview 3/16:  Lg ant-lateral and inf scar, no ischemia, EF 24%; high risk  . Hypernatriuria    Past Surgical History  Procedure Laterality Date  . Coronary artery bypass graft    . Left intertrochanteric hip fracture  2004    s/p intermedullary nail placement 2/08  . Left transverse mandibular fracture  10/05  . Laproscopic cholecystectomy  8/07  . Colonoscopy  2013    Dr.John Henrene Pastor    Family History  Problem Relation Age of Onset  . Heart failure Father   . Hypertension Father   . Diabetes Brother   . Heart attack Brother   . Colon cancer Neg Hx   . Alzheimer's disease Mother   . Stroke Sister   . Diabetes Sister   . Hypertension Brother   . Diabetes Brother   . Drug abuse Brother   . Alzheimer's disease Sister    Social History  Substance Use Topics  .  Smoking status: Current Every Day Smoker -- 0.50 packs/day for 36 years    Types: Cigarettes  . Smokeless tobacco: Never Used  . Alcohol Use: 3.6 oz/week    6 Standard drinks or equivalent per week     Comment: 2 - 24 oz beers / week    Review of Systems  Respiratory: Negative for shortness of breath.   Cardiovascular: Negative for chest pain.  Endocrine: Positive for polydipsia and polyuria.  Musculoskeletal: Positive for arthralgias ( bilateral feet tightness).  All other systems reviewed and are negative.  Allergies  Amphetamines  Home Medications   Prior to Admission medications   Medication Sig Start Date End Date Taking? Authorizing Provider  allopurinol (ZYLOPRIM) 300 MG tablet Take 1 tablet (300 mg total) by mouth daily. 06/18/15  Yes  Monica Carter, DO  ANORO ELLIPTA 62.5-25 MCG/INH AEPB Inhale 1 puff into the lungs daily. 03/09/15  Yes Historical Provider, MD  aspirin 81 MG tablet Take 81 mg by mouth daily.     Yes Historical Provider, MD  atorvastatin (LIPITOR) 40 MG tablet Take 40 mg by mouth daily.  11/25/13  Yes Historical Provider, MD  carvedilol (COREG) 25 MG tablet Take 1 tablet (25 mg total) by mouth 2 (two) times daily with a meal. 06/24/15  Yes [name], MD  colchicine 0.6 MG tablet Take 1 tablet (0.6 mg total) by mouth continuous as needed. For gout. 06/17/15  Yes Gildardo Cranker, DO  elvitegravir-cobicistat-emtricitabine-tenofovir (GENVOYA) 150-150-200-10 MG TABS tablet Take 1 tablet by mouth daily with breakfast. 01/05/15  Yes [name], MD  enalapril (VASOTEC) 10 MG tablet Take 10 mg by mouth 2 (two) times daily. 06/17/15  Yes Historical Provider, MD  glipiZIDE (GLUCOTROL) 5 MG tablet Take 1 tablet (5 mg total) by mouth 2 (two) times daily before a meal. 06/17/15  Yes Gildardo Cranker, DO  Glucosamine-Chondroit-Vit C-Mn (GLUCOSAMINE CHONDR 1500 COMPLX PO) Take 2 tablets by mouth daily.    Yes Historical Provider, MD  hydrALAZINE (APRESOLINE) 25 MG tablet Take 1 tablet (25 mg total) by mouth 3 (three) times daily. 06/24/15  Yes [name], MD  Insulin Glargine (LANTUS SOLOSTAR) 100 UNIT/ML Solostar Pen Inject 24 Units into the skin daily. 06/23/15  Yes Gildardo Cranker, DO  isosorbide mononitrate (IMDUR) 30 MG 24 hr tablet Take 30 mg by mouth daily.   Yes Historical Provider, MD  latanoprost (XALATAN) 0.005 % ophthalmic solution Place 1 drop into both eyes daily. 04/23/12  Yes Historical Provider, MD  NAFTIN 2 % CREA Apply 1 application topically 2 (two) times daily.  11/27/14  Yes Historical Provider, MD  nitroGLYCERIN (NITROSTAT) 0.3 MG SL tablet Place 1 tablet (0.3 mg total) under the tongue every 5 (five) minutes as needed. 01/07/15  Yes Scott T Kathlen Mody, PA-C  telmisartan (MICARDIS) 20 MG tablet Take 1 tablet (20 mg total)  by mouth daily. 06/17/15  Yes Gildardo Cranker, DO  B-D UF III MINI PEN NEEDLES 31G X 5 MM MISC  09/09/14   Historical Provider, MD  sodium polystyrene (KAYEXALATE) 15 GM/60ML suspension Take 240 mLs (60 g total) by mouth once. Patient not taking: Reported on 06/25/2015 06/18/15   Gildardo Cranker, DO   BP 149/91 mmHg  Pulse 77  Temp(Src) 99.1 F (37.3 C) (Oral)  Resp 18  Ht 6\' 2"  (1.88 m)  Wt 181 lb (82.101 kg)  BMI 23.23 kg/m2  SpO2 97% Physical Exam  Constitutional: He is oriented to person, place, and time. He appears well-developed and well-nourished.  HENT:  Head: Normocephalic and atraumatic.  Eyes: EOM are normal.  Neck: Normal range of motion.  Cardiovascular: Normal rate, regular rhythm, normal heart sounds and intact distal pulses.   Pulmonary/Chest: Effort normal and breath sounds normal. No respiratory distress.  Abdominal: Soft. He exhibits no distension. There is no tenderness.  Musculoskeletal: Normal range of motion.  Neurological: He is alert and oriented to person, place, and time.  Skin: Skin is warm and dry.  Psychiatric: He has a normal mood and affect. Judgment normal.  Nursing note and vitals reviewed.   ED Course  Procedures  DIAGNOSTIC STUDIES: Oxygen Saturation is 97% on RA, normal by my interpretation.    COORDINATION OF CARE:   Labs Review Labs Reviewed  BASIC METABOLIC PANEL - Abnormal; Notable for the following:    Sodium 134 (*)    CO2 19 (*)    Glucose, Bld 361 (*)    BUN 34 (*)    Creatinine, Ser 1.98 (*)    Calcium 8.8 (*)    GFR calc non Af Amer 33 (*)    GFR calc Af Amer 39 (*)    All other components within normal limits  CBC - Abnormal; Notable for the following:    RBC 3.99 (*)    HCT 37.5 (*)    All other components within normal limits  URINALYSIS, ROUTINE W REFLEX MICROSCOPIC (NOT AT [facility]) - Abnormal; Notable for the following:    Glucose, UA >1000 (*)    Protein, ur 100 (*)    All other components within normal limits  CBG  MONITORING, ED - Abnormal; Notable for the following:    Glucose-Capillary 352 (*)    All other components within normal limits  CBG MONITORING, ED - Abnormal; Notable for the following:    Glucose-Capillary 313 (*)    All other components within normal limits  URINE MICROSCOPIC-ADD ON  CBG MONITORING, ED    Imaging Review No results found. I have personally reviewed and evaluated these images and lab results as part of my medical decision-making.   EKG Interpretation None      MDM   Final diagnoses:  Hyperglycemia    Laboratory studies are without significant abnormalities in the emergency department except for his hyperglycemia.  Chronic renal insufficiency consistent with his priors.  Urine is without signs of infection.  No pulmonary symptoms.  Patient will be asked to follow-up with his primary care physician regarding his ongoing hyperglycemia.  No indication for additional management here in the emergency department.  Hydrated.    I personally performed the services described in this documentation, which was scribed in my presence. The recorded information has been reviewed and is accurate.      Jola Schmidt, MD 06/25/15 [phone]

## 2015-06-25 NOTE — Telephone Encounter (Signed)
Patient notified and understood. Will make appropriate changes and follow up as scheduled.

## 2015-06-25 NOTE — Discharge Instructions (Signed)

## 2015-06-25 NOTE — ED Notes (Signed)
CBG 313. Nurse notified.

## 2015-06-26 ENCOUNTER — Telehealth: Payer: Self-pay

## 2015-06-26 NOTE — Telephone Encounter (Signed)
Patient called to ask what should his blood sugar level be? Dr. [name] increase his medication, blood sugar still in 200's. Asked about diet, he "just had corn chips and cookie for lunch". Told him that he needed to eat better, just because he was on medication for his blood sugar doesn't mean it will keep BS down. It takes diet and medication. Patient was seen 06/17/15, called 06/24/15, went to ER on 06/25/15 for BS, and talk with other nurses.  Made appt with Oretha Ellis to go over his medication, and diet to help him understand and control his blood sugar better.  Patient was please with this.  Note to Dr. [name]

## 2015-06-26 NOTE — Telephone Encounter (Signed)
Noted  

## 2015-06-30 ENCOUNTER — Other Ambulatory Visit: Payer: Medicare Other

## 2015-06-30 DIAGNOSIS — B20 Human immunodeficiency virus [HIV] disease: Secondary | ICD-10-CM

## 2015-06-30 LAB — COMPREHENSIVE METABOLIC PANEL
ALBUMIN: 3.4 g/dL — AB (ref 3.6–5.1)
ALT: 62 U/L — ABNORMAL HIGH (ref 9–46)
AST: 59 U/L — AB (ref 10–35)
Alkaline Phosphatase: 64 U/L (ref 40–115)
BILIRUBIN TOTAL: 0.4 mg/dL (ref 0.2–1.2)
BUN: 28 mg/dL — ABNORMAL HIGH (ref 7–25)
CALCIUM: 8.8 mg/dL (ref 8.6–10.3)
CO2: 20 mmol/L (ref 20–31)
Chloride: 108 mmol/L (ref 98–110)
Creat: 1.49 mg/dL — ABNORMAL HIGH (ref 0.70–1.25)
GLUCOSE: 384 mg/dL — AB (ref 65–99)
POTASSIUM: 5 mmol/L (ref 3.5–5.3)
Sodium: 136 mmol/L (ref 135–146)
Total Protein: 7.3 g/dL (ref 6.1–8.1)

## 2015-07-01 LAB — T-HELPER CELL (CD4) - (RCID CLINIC ONLY)
CD4 % Helper T Cell: 41 % (ref 33–55)
CD4 T Cell Abs: 900 /uL (ref 400–2700)

## 2015-07-01 LAB — HIV-1 RNA QUANT-NO REFLEX-BLD: HIV 1 RNA Quant: <20 copies/mL (ref <20)

## 2015-07-03 ENCOUNTER — Other Ambulatory Visit: Payer: Self-pay | Admitting: *Deleted

## 2015-07-03 DIAGNOSIS — E1122 Type 2 diabetes mellitus with diabetic chronic kidney disease: Secondary | ICD-10-CM

## 2015-07-03 MED ORDER — GLIPIZIDE 5 MG PO TABS: ORAL_TABLET | ORAL | Status: DC

## 2015-07-03 NOTE — Telephone Encounter (Signed)
Patient requested refill to be faxed to pharmacy.  

## 2015-07-06 ENCOUNTER — Ambulatory Visit (INDEPENDENT_AMBULATORY_CARE_PROVIDER_SITE_OTHER): Payer: Medicare Other | Admitting: Pharmacotherapy

## 2015-07-06 ENCOUNTER — Encounter: Payer: Self-pay | Admitting: Pharmacotherapy

## 2015-07-06 VITALS — BP 126/86 | HR 70 | Temp 97.8°F | Resp 20 | Ht 74.0 in | Wt 180.8 lb

## 2015-07-06 DIAGNOSIS — N189 Chronic kidney disease, unspecified: Secondary | ICD-10-CM

## 2015-07-06 DIAGNOSIS — E1122 Type 2 diabetes mellitus with diabetic chronic kidney disease: Secondary | ICD-10-CM | POA: Diagnosis not present

## 2015-07-06 DIAGNOSIS — Z23 Encounter for immunization: Secondary | ICD-10-CM

## 2015-07-06 DIAGNOSIS — I10 Essential (primary) hypertension: Secondary | ICD-10-CM | POA: Diagnosis not present

## 2015-07-06 MED ORDER — INSULIN LISPRO 100 UNIT/ML (KWIKPEN): 4.0000 [IU] | PEN_INJECTOR | Freq: Three times a day (TID) | SUBCUTANEOUS | Status: DC

## 2015-07-06 NOTE — Progress Notes (Signed)
  Subjective:    Jacob [name] is a 64 y.o.African American male who presents for follow-up of Type 2 diabetes mellitus.   Last A1C was 7.6% His PMH is significant for CKD with estimated CrCl <56ml/min.  This limits his options for treating DM. He is currently on glargine and glipizide.  He is taking glipizide 2 tablets twice.   He has had DM since at least 2006. Average BG:  206mg /dl No hypoglycemia.  He is eating a healthy diet.  He limits his cookies. Walks for exercise. Denies problems with feet. Some peripheral edema. Wears corrective lenses Some blurry vision with hyperglycemia. Nocturia at least once per night.  His brother has DM  Review of Systems A comprehensive review of systems was negative except for: Eyes: positive for blurry Cardiovascular: positive for lower extremity edema Genitourinary: positive for nocturia    Objective:    BP 126/86 mmHg  Pulse 70  Temp(Src) 97.8 F (36.6 C) (Oral)  Resp 20  Ht 6\' 2"  (1.88 m)  Wt 180 lb 12.8 oz (82.01 kg)  BMI 23.20 kg/m2  SpO2 98%  General:  alert, cooperative and no distress  Oropharynx: normal findings: lips normal without lesions and gums healthy   Eyes:  negative findings: lids and lashes normal and conjunctivae and sclerae normal   Ears:  external ears normal        Lung: clear to auscultation bilaterally  Heart:  regular rate and rhythm     Extremities: edema trace edema bilateral lower extremities  Skin: dry     Neuro: mental status, speech normal, alert and oriented x3 and gait and station normal   Lab Review GLUCOSE (mg/dL)  Date Value  06/22/2015 256*  06/17/2015 138*   GLUCOSE, BLD (mg/dL)  Date Value  06/30/2015 384*  06/24/2015 361*  04/27/2015 130*   CO2 (mmol/L)  Date Value  06/30/2015 20  06/24/2015 19*  06/22/2015 16*   BUN (mg/dL)  Date Value  06/30/2015 28*  06/24/2015 34*  06/22/2015 44*  06/17/2015 33*  04/27/2015 26*   CREAT (mg/dL)  Date Value  06/30/2015  1.49*  11/25/2014 1.54*  05/15/2014 1.49*   CREATININE, SER (mg/dL)  Date Value  06/24/2015 1.98*  06/22/2015 1.90*  06/17/2015 1.69*       Assessment:    Diabetes Mellitus type II, under fair control.   BP at goal <140/90   Plan:    1.  Rx changes: stop glipizide.  Not having any effect from RX.  Start Humalog 4 units with each meal. 2.  Continue Lantus. 3.  Counseled on progression of DM and progressive loss of beta cells in the pancreas requiring exogenous insulin.   Explained that poor renal function limits our choices for oral therapies. 4.  Counseled on hypoglycemia. 5.  Counseled on nutrition goals. 6.  Counseled on benefit of routine exercise  Goal is 30-45 minutes 5 x week. 7.  HTN at goal. 8.  RTC in 1 month

## 2015-07-06 NOTE — Patient Instructions (Signed)
Stop glipizide Start Humalog 4 units with each meal

## 2015-07-09 ENCOUNTER — Telehealth: Payer: Self-pay | Admitting: *Deleted

## 2015-07-09 DIAGNOSIS — E1122 Type 2 diabetes mellitus with diabetic chronic kidney disease: Secondary | ICD-10-CM

## 2015-07-09 NOTE — Telephone Encounter (Signed)
Ok to increase humalog to 8 units with meals and continue to monitor.  He may report his results again next week.  Let us know if he has any lows, as well.

## 2015-07-09 NOTE — Telephone Encounter (Signed)
Patient called and stated that [facility] increased his Humalog Insulin to 4 units with meals, but his blood sugar is still running high and he would like to know if it can be increased more. Please Advise.  Blood sugar  07/09/15- 176 07/08/15- 176 pm 218 07/07/15- 227 pm 177 07/06/15- 179 pm 202

## 2015-07-10 MED ORDER — INSULIN LISPRO 100 UNIT/ML (KWIKPEN): PEN_INJECTOR | SUBCUTANEOUS | Status: DC

## 2015-07-10 NOTE — Telephone Encounter (Signed)
Patient notified and agreed.  

## 2015-07-14 ENCOUNTER — Ambulatory Visit (INDEPENDENT_AMBULATORY_CARE_PROVIDER_SITE_OTHER): Payer: Medicare Other | Admitting: Internal Medicine

## 2015-07-14 ENCOUNTER — Encounter: Payer: Self-pay | Admitting: Internal Medicine

## 2015-07-14 DIAGNOSIS — B20 Human immunodeficiency virus [HIV] disease: Secondary | ICD-10-CM

## 2015-07-14 NOTE — Assessment & Plan Note (Signed)
His HIV infection remains under excellent control. He's taking his Genvoya correctly and his adherence is excellent. I will see him back after blood work in 6 months. He received his influenza vaccination last week.
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
## 2015-07-17 ENCOUNTER — Telehealth: Payer: Self-pay | Admitting: *Deleted

## 2015-07-17 NOTE — Telephone Encounter (Signed)
Increase lantus to 34 units qhs. Continue qAC insulin. Continue home BS checks and call with results weekly

## 2015-07-17 NOTE — Telephone Encounter (Signed)
Patient called with an update on his blood sugars since increasing Humalog to 8units. Please Advise.  07/13/2015  189am  176pm 07/14/2015  183am  158pm 07/15/2015  213am  188pm 07/16/2015  214am  175pm 07/17/2015  209am

## 2015-07-20 MED ORDER — INSULIN GLARGINE 100 UNIT/ML SOLOSTAR PEN: 34.0000 [IU] | PEN_INJECTOR | Freq: Every day | SUBCUTANEOUS | Status: DC

## 2015-07-20 NOTE — Telephone Encounter (Signed)
Patient Notified and agreed. 

## 2015-07-23 NOTE — Progress Notes (Signed)
Notified [facility] via fax.  Aadyn Buchheit M, RN  

## 2015-08-04 DIAGNOSIS — H401132 Primary open-angle glaucoma, bilateral, moderate stage: Secondary | ICD-10-CM | POA: Diagnosis not present

## 2015-08-04 LAB — HM DIABETES EYE EXAM

## 2015-08-07 ENCOUNTER — Ambulatory Visit (INDEPENDENT_AMBULATORY_CARE_PROVIDER_SITE_OTHER): Payer: Medicare Other | Admitting: Internal Medicine

## 2015-08-07 ENCOUNTER — Encounter: Payer: Self-pay | Admitting: Internal Medicine

## 2015-08-07 ENCOUNTER — Telehealth: Payer: Self-pay | Admitting: Cardiovascular Disease

## 2015-08-07 VITALS — BP 138/86 | HR 64 | Temp 98.4°F | Resp 14 | Ht 73.5 in | Wt 182.2 lb

## 2015-08-07 DIAGNOSIS — Z794 Long term (current) use of insulin: Secondary | ICD-10-CM | POA: Diagnosis not present

## 2015-08-07 DIAGNOSIS — Z0000 Encounter for general adult medical examination without abnormal findings: Secondary | ICD-10-CM | POA: Diagnosis not present

## 2015-08-07 DIAGNOSIS — I10 Essential (primary) hypertension: Secondary | ICD-10-CM

## 2015-08-07 DIAGNOSIS — Z125 Encounter for screening for malignant neoplasm of prostate: Secondary | ICD-10-CM | POA: Diagnosis not present

## 2015-08-07 DIAGNOSIS — E785 Hyperlipidemia, unspecified: Secondary | ICD-10-CM | POA: Diagnosis not present

## 2015-08-07 DIAGNOSIS — I255 Ischemic cardiomyopathy: Secondary | ICD-10-CM

## 2015-08-07 DIAGNOSIS — B20 Human immunodeficiency virus [HIV] disease: Secondary | ICD-10-CM | POA: Diagnosis not present

## 2015-08-07 DIAGNOSIS — Z716 Tobacco abuse counseling: Secondary | ICD-10-CM | POA: Diagnosis not present

## 2015-08-07 DIAGNOSIS — E1122 Type 2 diabetes mellitus with diabetic chronic kidney disease: Secondary | ICD-10-CM | POA: Diagnosis not present

## 2015-08-07 NOTE — Progress Notes (Signed)
Patient ID: HERVIN DUCH, male   DOB: 06-09-1951, 64 y.o.   MRN: [id number] Subjective:     Jacob [name] is a 64 y.o. male and is here for a comprehensive physical exam. The patient reports no problems.  DM - BS elevated >200 at home yesterday due to poor food choices. Usually BS 100-160s. No low BS reactions. Occasional tingling in LLE with prolonged walking. His feet feel cold at night. He has taken neuropathy in the past. He takes insulin. Last eye exam earlier this week and no diabetic changes noted but he does have glaucoma  HTN - stable on hydralazine and micardis.   HIV - stable. CD4 900 and HIV viral load undetectable. He takes genvoya. mx by ID Dr [name]  Hyperlipidemia - stable on lipitor  CAD/ICM - stable. He takes ASA daily along with coreg, statin, imdur, prn SLNTG  Tobacco abuse - he is still smoking. He does not want to try chantix.  Gout - stable on allopurinol. He had 1 acute attack in left 1st toe in September after consuming white liquor during class reunion and had to take colchicine.   Past Medical History  Diagnosis Date  . Syphilis, unspecified   . Coronary atherosclerosis of native coronary artery     s/p surgery  . Type II or unspecified type diabetes mellitus without mention of complication, not stated as uncontrolled 2004    Diagnosed in 2004. Came to our clinic in 5/07 and was on Glucotrol XL 10 mg BID and metformin 500 BID. Max A1C on those two meds was 6.9 on 03/10/08. He has been able to be removed from the meds while maintaining a good A1C. He has DM neuropathy (although neuropathy could be multifactorial.)  . Human immunodeficiency virus (HIV) disease ([place])   . Allergic rhinitis, cause unspecified   . Gout, unspecified   . Hyperlipidemia   . HTN (hypertension)   . Ischemic cardiomyopathy   . Drug abuse     hx; tested for cocaine as recently as 2/08. says he is not using drugs now - avoided defib. for this reason   . Acute maxillary sinusitis    . Backache, unspecified   . Coronary atherosclerosis of artery bypass graft   . Closed fracture of intertrochanteric section of femur ([place])   . Chronic kidney disease, unspecified ([place])   . Tobacco use disorder   . History of hepatitis C   . GERD (gastroesophageal reflux disease)   . Arthritis   . Hx of cardiovascular stress test     Lexiscan Myoview 3/16:  Lg ant-lateral and inf scar, no ischemia, EF 24%; high risk  . Hypernatriuria    Past Surgical History  Procedure Laterality Date  . Coronary artery bypass graft    . Left intertrochanteric hip fracture  2004    s/p intermedullary nail placement 2/08  . Left transverse mandibular fracture  10/05  . Laproscopic cholecystectomy  8/07  . Colonoscopy  2013    Dr.John Henrene Pastor    Family History  Problem Relation Age of Onset  . Heart failure Father   . Hypertension Father   . Diabetes Brother   . Heart attack Brother   . Colon cancer Neg Hx   . Alzheimer's disease Mother   . Stroke Sister   . Diabetes Sister   . Hypertension Brother   . Diabetes Brother   . Drug abuse Brother   . Alzheimer's disease Sister      Social History  Social History  . Marital Status: Single    Spouse Name: N/A  . Number of Children: N/A  . Years of Education: N/A   Occupational History  . Not on file.   Social History Main Topics  . Smoking status: Current Every Day Smoker -- 0.50 packs/day for 36 years    Types: Cigarettes  . Smokeless tobacco: Never Used  . Alcohol Use: 3.6 oz/week    6 Standard drinks or equivalent per week     Comment: 2 - 24 oz beers / week  . Drug Use: No     Comment: none since April 2016 (cocaine use)  . Sexual Activity:    Partners: Male     Comment: declined condoms   Other Topics Concern  . Not on file   Social History Narrative   Retired.       As of 05/2015:   Diet: No salt    Caffeine   Married: Single    House: [place], 2 stories, 1 person (self)    Pets: No    Current/Past profession:  N/A   Exercise: walks daily    Living Will: Yes    DNR   POA/HPOA: No          Health Maintenance  Topic Date Due  . FOOT EXAM  07/27/2012  . PNA vac Low Risk Adult (1 of 2 - PCV13) 01/24/2014  . OPHTHALMOLOGY EXAM  06/03/2015  . HEMOGLOBIN A1C  07/27/2015  . LIPID PANEL  11/26/2015  . INFLUENZA VACCINE  05/24/2016  . TETANUS/TDAP  07/27/2021  . COLONOSCOPY  10/01/2022  . ZOSTAVAX  Completed  . Hepatitis C Screening  Completed    Review of Systems   Review of Systems  Constitutional: Negative for fever, chills and malaise/fatigue.  HENT: Negative for sore throat and tinnitus.   Eyes: Negative for blurred vision and double vision.  Respiratory: Negative for cough, shortness of breath and wheezing.   Cardiovascular: Negative for chest pain, palpitations, orthopnea and leg swelling.  Gastrointestinal: Negative for heartburn, nausea, vomiting, abdominal pain, diarrhea, constipation and blood in stool.  Genitourinary: Negative for dysuria, urgency, frequency and hematuria.  Musculoskeletal: Positive for joint pain. Negative for myalgias and falls.  Skin: Negative for rash.  Neurological: Positive for tingling and sensory change. Negative for dizziness, tremors, focal weakness, seizures, loss of consciousness, weakness and headaches.  Endo/Heme/Allergies: Negative for environmental allergies. Does not bruise/bleed easily.  Psychiatric/Behavioral: Negative for depression and memory loss. The patient is not nervous/anxious and does not have insomnia.      Objective:      Physical Exam  Constitutional: He is oriented to person, place, and time and well-developed, well-nourished, and in no distress.  HENT:  Head: Normocephalic and atraumatic.  Right Ear: Hearing, tympanic membrane, external ear and ear canal normal.  Left Ear: Hearing, tympanic membrane, external ear and ear canal normal.  Mouth/Throat: Uvula is midline, oropharynx is clear and moist and mucous membranes are  normal.  Eyes: Conjunctivae, EOM and lids are normal. Right eye exhibits no discharge. No scleral icterus.  Neck: Trachea normal. Neck supple. Carotid bruit is not present. No tracheal deviation present. No thyroid mass and no thyromegaly present.  Cardiovascular: Normal rate, regular rhythm, normal heart sounds and intact distal pulses.  Exam reveals no gallop and no friction rub.   No murmur heard. Pulmonary/Chest: Effort normal and breath sounds normal. No stridor. No respiratory distress. He has no wheezes. He has no rhonchi. He has no rales.  He exhibits no mass, no tenderness and no crepitus. Right breast exhibits no inverted nipple, no mass, no nipple discharge, no skin change and no tenderness. Left breast exhibits no inverted nipple, no mass, no nipple discharge, no skin change and no tenderness. Breasts are symmetrical.  Abdominal: Soft. Normal appearance, normal aorta and bowel sounds are normal. He exhibits no abdominal bruit, no ascites, no pulsatile midline mass and no mass. There is no hepatosplenomegaly. There is no tenderness. There is no rebound. No hernia.  Genitourinary: Rectum normal, testes/scrotum normal and penis normal. Rectal exam shows no external hemorrhoid, no internal hemorrhoid, no fissure, no laceration, no mass and no tenderness. Guaiac negative stool. Prostate is enlarged (smooth, firm and no palpable mass). Prostate is not tender. No discharge found.  Musculoskeletal: He exhibits edema and tenderness.  Lymphadenopathy:       Head (right side): No submandibular and no posterior auricular adenopathy present.       Head (left side): No submandibular and no posterior auricular adenopathy present.    He has no cervical adenopathy.       Right: No supraclavicular adenopathy present.       Left: No supraclavicular adenopathy present.  Neurological: He is alert and oriented to person, place, and time. He has normal motor skills, normal strength and normal reflexes. Gait  normal.  Skin: Skin is warm, dry and intact. No rash noted.  Psychiatric: Mood, memory, affect and judgment normal.      Diabetic Foot Exam - Simple   Simple Foot Form  Diabetic Foot exam was performed with the following findings:  Yes 08/07/2015 10:40 AM  Visual Inspection  See comments:  Yes  Sensation Testing  See comments:  Yes  Pulse Check  Posterior Tibialis and Dorsalis pulse intact bilaterally:  Yes  Comments  B/l bunion. B/l 1st toe plantar callus with pre-ulceration on left. Reduced monofilament testing L>R plantar surface     MMSE - Mini Mental State Exam 08/07/2015  Not completed: (No Data)  Orientation to time 5  Orientation to Place 5  Registration 3  Attention/ Calculation 5  Recall 3  Language- name 2 objects 2  Language- repeat 1  Language- follow 3 step command 3  Language- read & follow direction 1  Write a sentence 1  Copy design 0  Total score 29    Assessment:    Healthy male exam.       ICD-9-CM ICD-10-CM   1. Well adult exam V70.0 Z00.00   2. Type 2 diabetes mellitus with chronic kidney disease, with long-term current use of insulin, unspecified CKD stage (HCC) 250.40 E11.22 CBC with Differential   585.9 Z79.4 Microalbumin/Creatinine Ratio, Urine   V58.67  Hemoglobin A1c     CMP  3. Essential hypertension 401.9 I10   4. Ischemic cardiomyopathy 414.8 I25.5 CBC with Differential     CMP  5. HLD (hyperlipidemia) 272.4 E78.5 Lipid Panel     TSH  6. Human immunodeficiency virus (HIV) disease ([place]) 042 B20   7. Screening for prostate cancer V76.44 Z12.5 PSA  8. Tobacco abuse counseling V65.42 Z71.6    305.1      Plan:     See After Visit Summary for Counseling Recommendations   Pt is UTD on health maintenance. Vaccinations are UTD. Pt maintains a healthy lifestyle. Encouraged pt to exercise 30-45 minutes 4-5 times per week. Eat a well balanced diet. Avoid smoking. Limit alcohol intake. Wear seatbelt when riding in the car. Wear sun block  (  SPF >50) when spending extended times outside.  Tobacco counseling >5 minutes. Info given on wellbutrin and he will let me know if he desires to start med  Reminder: Bring copy of [place] to next appointment.   Continue current medications as ordered  Follow up with specialists as scheduled  Will call with lab results  Follow up in 3-4 mos for routine visit.   Jacob [name]  [facility] [address] [place], [place] [address] [phone] Cell (Monday-Friday 8 AM - 5 PM) [phone] After 5 PM and follow prompts

## 2015-08-07 NOTE — Telephone Encounter (Signed)
Walk in pt form-Sealed envelope dropped off-Dr. [name] back 10/19

## 2015-08-07 NOTE — Patient Instructions (Addendum)
Reminder: Bring copy of [place] to next appointment.   Encouraged him to exercise 30-45 minutes 4-5 times per week. Eat a well balanced diet. Avoid smoking. Limit alcohol intake. Wear seatbelt when riding in the car. Wear sun block (SPF >50) when spending extended times outside.  Smoking cessation discussed and highly urged. He will read information about wellbutrin and let me know if he decides to start medicine  Continue current medications as ordered  Follow up with specialists as scheduled  Will call with lab results  Follow up in 3-4 mos for routine visit.   Bupropion sustained-release tablets (smoking cessation) What is this medicine? BUPROPION (byoo PROE pee on) is used to help people quit smoking. This medicine may be used for other purposes; ask your health care provider or pharmacist if you have questions. What should I tell my health care provider before I take this medicine? They need to know if you have any of these conditions: -an eating disorder, such as anorexia or bulimia -bipolar disorder or psychosis -diabetes or high blood sugar, treated with medication -glaucoma -head injury or brain tumor -heart disease, previous heart attack, or irregular heart beat -high blood pressure -kidney or liver disease -seizures -suicidal thoughts or a previous suicide attempt -Tourette's syndrome -weight loss -an unusual or allergic reaction to bupropion, other medicines, foods, dyes, or preservatives -breast-feeding -pregnant or trying to become pregnant How should I use this medicine? Take this medicine by mouth with a glass of water. Follow the directions on the prescription label. You can take it with or without food. If it upsets your stomach, take it with food. Do not cut, crush or chew this medicine. Take your medicine at regular intervals. If you take this medicine more than once a day, take your second dose at least 8 hours after you take your first  dose. To limit difficulty in sleeping, avoid taking this medicine at bedtime. Do not take your medicine more often than directed. Do not stop taking this medicine suddenly except upon the advice of your doctor. Stopping this medicine too quickly may cause serious side effects. A special MedGuide will be given to you by the pharmacist with each prescription and refill. Be sure to read this information carefully each time. Talk to your pediatrician regarding the use of this medicine in children. Special care may be needed. Overdosage: If you think you have taken too much of this medicine contact a poison control center or emergency room at once. NOTE: This medicine is only for you. Do not share this medicine with others. What if I miss a dose? If you miss a dose, skip the missed dose and take your next tablet at the regular time. There should be at least 8 hours between doses. Do not take double or extra doses. What may interact with this medicine? Do not take this medicine with any of the following medications: -linezolid -MAOIs like Azilect, Carbex, Eldepryl, Marplan, Nardil, and Parnate -methylene blue (injected into a vein) -other medicines that contain bupropion like Wellbutrin This medicine may also interact with the following medications: -alcohol -certain medicines for anxiety or sleep -certain medicines for blood pressure like metoprolol, propranolol -certain medicines for depression or psychotic disturbances -certain medicines for HIV or AIDS like efavirenz, lopinavir, nelfinavir, ritonavir -certain medicines for irregular heart beat like propafenone, flecainide -certain medicines for Parkinson's disease like amantadine, levodopa -certain medicines for seizures like carbamazepine, phenytoin, phenobarbital -cimetidine -clopidogrel -cyclophosphamide -furazolidone -isoniazid -nicotine -orphenadrine -procarbazine -steroid medicines  like prednisone or cortisone -stimulant medicines  for attention disorders, weight loss, or to stay awake -tamoxifen -theophylline -thiotepa -ticlopidine -tramadol -warfarin This list may not describe all possible interactions. Give your health care provider a list of all the medicines, herbs, non-prescription drugs, or dietary supplements you use. Also tell them if you smoke, drink alcohol, or use illegal drugs. Some items may interact with your medicine. What should I watch for while using this medicine? Visit your doctor or health care professional for regular checks on your progress. This medicine should be used together with a patient support program. It is important to participate in a behavioral program, counseling, or other support program that is recommended by your health care professional. Patients and their families should watch out for new or worsening thoughts of suicide or depression. Also watch out for sudden changes in feelings such as feeling anxious, agitated, panicky, irritable, hostile, aggressive, impulsive, severely restless, overly excited and hyperactive, or not being able to sleep. If this happens, especially at the beginning of treatment or after a change in dose, call your health care professional. Avoid alcoholic drinks while taking this medicine. Drinking excessive alcoholic beverages, using sleeping or anxiety medicines, or quickly stopping the use of these agents while taking this medicine may increase your risk for a seizure. Do not drive or use heavy machinery until you know how this medicine affects you. This medicine can impair your ability to perform these tasks. Do not take this medicine close to bedtime. It may prevent you from sleeping. Your mouth may get dry. Chewing sugarless gum or sucking hard candy, and drinking plenty of water may help. Contact your doctor if the problem does not go away or is severe. Do not use nicotine patches or chewing gum without the advice of your doctor or health care professional  while taking this medicine. You may need to have your blood pressure taken regularly if your doctor recommends that you use both nicotine and this medicine together. What side effects may I notice from receiving this medicine? Side effects that you should report to your doctor or health care professional as soon as possible: -allergic reactions like skin rash, itching or hives, swelling of the face, lips, or tongue -breathing problems -changes in vision -confusion -fast or irregular heartbeat -hallucinations -increased blood pressure -redness, blistering, peeling or loosening of the skin, including inside the mouth -seizures -suicidal thoughts or other mood changes -unusually weak or tired -vomiting Side effects that usually do not require medical attention (report to your doctor or health care professional if they continue or are bothersome): -change in sex drive or performance -constipation -headache -loss of appetite -nausea -tremors -weight loss This list may not describe all possible side effects. Call your doctor for medical advice about side effects. You may report side effects to FDA at 1-800-FDA-1088. Where should I keep my medicine? Keep out of the reach of children. Store at room temperature between 20 and 25 degrees C (68 and 77 degrees F). Protect from light. Keep container tightly closed. Throw away any unused medicine after the expiration date. NOTE: This sheet is a summary. It may not cover all possible information. If you have questions about this medicine, talk to your doctor, pharmacist, or health care provider.    2016, Elsevier/Gold Standard. (2013-06-07 10:55:10)

## 2015-08-08 LAB — COMPREHENSIVE METABOLIC PANEL
A/G RATIO: 1 — AB (ref 1.1–2.5)
ALT: 52 IU/L — AB (ref 0–44)
AST: 40 IU/L (ref 0–40)
Albumin: 3.8 g/dL (ref 3.6–4.8)
Alkaline Phosphatase: 74 IU/L (ref 39–117)
BILIRUBIN TOTAL: 0.5 mg/dL (ref 0.0–1.2)
BUN/Creatinine Ratio: 13 (ref 10–22)
BUN: 18 mg/dL (ref 8–27)
CHLORIDE: 102 mmol/L (ref 97–108)
CO2: 21 mmol/L (ref 18–29)
Calcium: 9.4 mg/dL (ref 8.6–10.2)
Creatinine, Ser: 1.38 mg/dL — ABNORMAL HIGH (ref 0.76–1.27)
GFR calc non Af Amer: 53 mL/min/{1.73_m2} — ABNORMAL LOW (ref >59)
GFR, EST AFRICAN AMERICAN: 61 mL/min/{1.73_m2} (ref >59)
GLOBULIN, TOTAL: 4 g/dL (ref 1.5–4.5)
Glucose: 125 mg/dL — ABNORMAL HIGH (ref 65–99)
POTASSIUM: 4.2 mmol/L (ref 3.5–5.2)
SODIUM: 138 mmol/L (ref 134–144)
TOTAL PROTEIN: 7.8 g/dL (ref 6.0–8.5)

## 2015-08-08 LAB — CBC WITH DIFFERENTIAL/PLATELET
BASOS ABS: 0.1 10*3/uL (ref 0.0–0.2)
Basos: 1 %
EOS (ABSOLUTE): 0.3 10*3/uL (ref 0.0–0.4)
Eos: 4 %
HEMOGLOBIN: 14.2 g/dL (ref 12.6–17.7)
Hematocrit: 41.9 % (ref 37.5–51.0)
IMMATURE GRANS (ABS): 0 10*3/uL (ref 0.0–0.1)
Immature Granulocytes: 0 %
LYMPHS: 50 %
Lymphocytes Absolute: 3 10*3/uL (ref 0.7–3.1)
MCH: 32.7 pg (ref 26.6–33.0)
MCHC: 33.9 g/dL (ref 31.5–35.7)
MCV: 97 fL (ref 79–97)
MONOCYTES: 5 %
Monocytes Absolute: 0.3 10*3/uL (ref 0.1–0.9)
NEUTROS PCT: 40 %
Neutrophils Absolute: 2.4 10*3/uL (ref 1.4–7.0)
PLATELETS: 157 10*3/uL (ref 150–379)
RBC: 4.34 x10E6/uL (ref 4.14–5.80)
RDW: 14.2 % (ref 12.3–15.4)
WBC: 6 10*3/uL (ref 3.4–10.8)

## 2015-08-08 LAB — LIPID PANEL
Chol/HDL Ratio: 3.8 ratio units (ref 0.0–5.0)
Cholesterol, Total: 121 mg/dL (ref 100–199)
HDL: 32 mg/dL — AB (ref >39)
LDL Calculated: 15 mg/dL (ref 0–99)
Triglycerides: 371 mg/dL — ABNORMAL HIGH (ref 0–149)
VLDL Cholesterol Cal: 74 mg/dL — ABNORMAL HIGH (ref 5–40)

## 2015-08-08 LAB — MICROALBUMIN / CREATININE URINE RATIO
CREATININE, UR: 235.1 mg/dL
MICROALB/CREAT RATIO: 1142.6 mg/g creat — ABNORMAL HIGH (ref 0.0–30.0)
MICROALBUM., U, RANDOM: 2686.2 ug/mL

## 2015-08-08 LAB — HEMOGLOBIN A1C
ESTIMATED AVERAGE GLUCOSE: 197 mg/dL
HEMOGLOBIN A1C: 8.5 % — AB (ref 4.8–5.6)

## 2015-08-08 LAB — PSA: Prostate Specific Ag, Serum: 0.4 ng/mL (ref 0.0–4.0)

## 2015-08-08 LAB — TSH: TSH: 0.691 u[IU]/mL (ref 0.450–4.500)

## 2015-08-10 ENCOUNTER — Other Ambulatory Visit: Payer: Self-pay | Admitting: *Deleted

## 2015-08-10 ENCOUNTER — Ambulatory Visit: Payer: Medicare Other | Admitting: Pharmacotherapy

## 2015-08-10 DIAGNOSIS — M109 Gout, unspecified: Secondary | ICD-10-CM

## 2015-08-10 DIAGNOSIS — E0865 Diabetes mellitus due to underlying condition with hyperglycemia: Secondary | ICD-10-CM

## 2015-08-10 DIAGNOSIS — E119 Type 2 diabetes mellitus without complications: Secondary | ICD-10-CM | POA: Diagnosis not present

## 2015-08-10 DIAGNOSIS — E0869 Diabetes mellitus due to underlying condition with other specified complication: Principal | ICD-10-CM

## 2015-08-10 DIAGNOSIS — IMO0002 Reserved for concepts with insufficient information to code with codable children: Secondary | ICD-10-CM

## 2015-08-10 MED ORDER — ALLOPURINOL 100 MG PO TABS: 100.0000 mg | ORAL_TABLET | Freq: Every day | ORAL | Status: DC

## 2015-08-10 MED ORDER — INSULIN GLARGINE 100 UNIT/ML SOLOSTAR PEN: 40.0000 [IU] | PEN_INJECTOR | Freq: Every day | SUBCUTANEOUS | Status: DC

## 2015-08-11 ENCOUNTER — Ambulatory Visit (INDEPENDENT_AMBULATORY_CARE_PROVIDER_SITE_OTHER): Payer: Medicare Other | Admitting: Podiatry

## 2015-08-11 ENCOUNTER — Encounter: Payer: Self-pay | Admitting: Podiatry

## 2015-08-11 DIAGNOSIS — Q828 Other specified congenital malformations of skin: Secondary | ICD-10-CM | POA: Diagnosis not present

## 2015-08-11 DIAGNOSIS — M79676 Pain in unspecified toe(s): Secondary | ICD-10-CM

## 2015-08-11 DIAGNOSIS — B351 Tinea unguium: Secondary | ICD-10-CM

## 2015-08-11 NOTE — Progress Notes (Signed)
[name], male   DOB: Jun 10, 1951, 64 y.o.   MRN: [id number] Complaint:  Visit Type: Patient returns to my office for continued preventative foot care services. Complaint: Patient states" my nails have grown long and thick and become painful to walk and wear shoes" Patient has been diagnosed with DM with neuropathy. He presents for preventative foot care services. No changes to ROS.  He has painful calluses on bottom both feet.  Podiatric Exam: Vascular: dorsalis pedis and posterior tibial pulses are palpable bilateral. Capillary return is immediate. Temperature gradient is WNL. Skin turgor WNL  Sensorium: Normal Semmes Weinstein monofilament test. Normal tactile sensation bilaterally. Nail Exam: Pt has thick disfigured discolored nails with subungual debris noted bilateral entire nail hallux through fifth toenails Ulcer Exam: There is no evidence of ulcer or pre-ulcerative changes or infection. Orthopedic Exam: Muscle tone and strength are WNL. No limitations in general ROM. No crepitus or effusions noted. Foot type and digits show no abnormalities. Bony prominences are unremarkable. Severe HAV 1st MPJ B/L Skin:  Porokeratosis sub 5th met B/L.  Pinch callus right big toe.. No infection or ulcers  Diagnosis:  Tinea unguium, Pain in right toe, pain in left toes  Treatment & Plan Procedures and Treatment: Consent by patient was obtained for treatment procedures. The patient understood the discussion of treatment and procedures well. All questions were answered thoroughly reviewed. Debridement of mycotic and hypertrophic toenails, 1 through 5 bilateral and clearing of subungual debris. No ulceration, no infection noted.  Return Visit-Office Procedure: Patient instructed to return to the office for a follow up visit 3 months for continued evaluation and treatment.

## 2015-08-13 ENCOUNTER — Telehealth: Payer: Self-pay | Admitting: *Deleted

## 2015-08-13 NOTE — Telephone Encounter (Signed)
Patient called regarding Lantus pens, he stated since Dr. [name] changed his prescription he does not have enough and the pharmacy is not wanted to give any more at this time. He stated hopefully they will have that straight by the first of the week.

## 2015-08-14 ENCOUNTER — Other Ambulatory Visit: Payer: Self-pay | Admitting: *Deleted

## 2015-08-14 MED ORDER — BD PEN NEEDLE MINI U/F 31G X 5 MM MISC: Status: DC

## 2015-08-14 NOTE — Telephone Encounter (Signed)
Patient called and requested refill and stated that he uses them four times daily. Faxed to pharmacy.

## 2015-08-17 ENCOUNTER — Encounter: Payer: Self-pay | Admitting: Pharmacotherapy

## 2015-08-17 ENCOUNTER — Ambulatory Visit (INDEPENDENT_AMBULATORY_CARE_PROVIDER_SITE_OTHER): Payer: Medicare Other | Admitting: Pharmacotherapy

## 2015-08-17 VITALS — BP 122/72 | HR 69 | Temp 98.0°F | Resp 20 | Ht 74.0 in | Wt 186.0 lb

## 2015-08-17 DIAGNOSIS — I10 Essential (primary) hypertension: Secondary | ICD-10-CM | POA: Diagnosis not present

## 2015-08-17 DIAGNOSIS — Z794 Long term (current) use of insulin: Secondary | ICD-10-CM

## 2015-08-17 DIAGNOSIS — E1122 Type 2 diabetes mellitus with diabetic chronic kidney disease: Secondary | ICD-10-CM

## 2015-08-17 NOTE — Patient Instructions (Signed)
Keep up the good work

## 2015-08-17 NOTE — Progress Notes (Signed)
  Subjective:    Jacob [name] is a 64 y.o.African American male who presents for follow-up of Type 2 diabetes mellitus.  Last month stopped glipizide and started Humalog with meals.  He is on Lantus 42 units daily He is on Humalog 8 units with each meal  Average  BG:  154mg /dl No hypoglycemia  He is walking for exercise. Trying to make healthy food choices He has more energy.  Plans to resume working 2-3 days per week. Denies problems with feet.  Saw podiatrist last week. Has had eye exam since last OV.  No problems noted.  Wears glasses. Nocturia improved.  Only once per night.   Review of Systems A comprehensive review of systems was negative except for: Eyes: positive for contacts/glasses Genitourinary: positive for nocturia    Objective:    BP 122/72 mmHg  Pulse 69  Temp(Src) 98 F (36.7 C) (Oral)  Resp 20  Ht 6\' 2"  (1.88 m)  Wt 186 lb (84.369 kg)  BMI 23.87 kg/m2  SpO2 98%  General:  alert, cooperative and no distress  Oropharynx: normal findings: lips normal without lesions and gums healthy   Eyes:  negative findings: lids and lashes normal and conjunctivae and sclerae normal   Ears:  external ears normal        Lung: clear to auscultation bilaterally  Heart:  regular rate and rhythm     Extremities: extremities normal, atraumatic, no cyanosis or edema  Skin: warm and dry, no hyperpigmentation, vitiligo, or suspicious lesions     Neuro: mental status, speech normal, alert and oriented x3 and gait and station normal   Lab Review GLUCOSE (mg/dL)  Date Value  08/07/2015 125*  06/22/2015 256*  06/17/2015 138*   GLUCOSE, BLD (mg/dL)  Date Value  06/30/2015 384*  06/24/2015 361*  04/27/2015 130*   CO2 (mmol/L)  Date Value  08/07/2015 21  06/30/2015 20  06/24/2015 19*   BUN (mg/dL)  Date Value  08/07/2015 18  06/30/2015 28*  06/24/2015 34*  06/22/2015 44*  06/17/2015 33*  04/27/2015 26*   CREAT (mg/dL)  Date Value  06/30/2015 1.49*   11/25/2014 1.54*  05/15/2014 1.49*   CREATININE, SER (mg/dL)  Date Value  08/07/2015 1.38*  06/24/2015 1.98*  06/22/2015 1.90*       Assessment:    Diabetes Mellitus type II, under good control.   BP at goal <140/90   Plan:    1.  Rx changes: none 2.  Continue Lantus 42 units daily 3.  Continue Humalog 8 units with each meal. 4.  Counseled on hypoglycemia. 5.  Counseled on need for routine exercise.  Goal is 30-45 minutes with each meal. 6.  BP well controlled.

## 2015-08-18 ENCOUNTER — Telehealth: Payer: Self-pay | Admitting: *Deleted

## 2015-08-18 NOTE — Telephone Encounter (Signed)
Received fax from [facility] [phone] Fax:[phone] for Lancets. Filled out and given to Dr. [name] to review and sign.

## 2015-08-26 ENCOUNTER — Telehealth: Payer: Self-pay | Admitting: *Deleted

## 2015-08-26 NOTE — Telephone Encounter (Signed)
Please advise on Rx-reply to [facility]

## 2015-08-26 NOTE — Telephone Encounter (Addendum)
Refill request for Naftin 2% Cream apply bid. Dr. Milinda Pointer ordered Clotrimazole cream apply to affected area bid +11 refills.  Orders to [facility].

## 2015-08-26 NOTE — Telephone Encounter (Signed)
Give him clotrimazole cream bid.  Thanks

## 2015-08-27 MED ORDER — CLOTRIMAZOLE-BETAMETHASONE 1-0.05 % EX CREA: 1.0000 "application " | TOPICAL_CREAM | Freq: Two times a day (BID) | CUTANEOUS | Status: DC

## 2015-08-31 ENCOUNTER — Telehealth: Payer: Self-pay | Admitting: *Deleted

## 2015-08-31 NOTE — Telephone Encounter (Addendum)
Pt states he has been trying to get refill of Naftin 2% cream, but his pharmacy states our office will not fill.  I left message informing pt that Dr.Hyatt ordered generic Lotrisone cream.  I told pt his insurance may not cover the Naftin 2%, but I would see if Dr. Milinda Pointer would order for him, because although I did see the Naftin as a medication on his history of medications I did not see it as a medication Dr. Milinda Pointer ordered.  Pt called states he does not want the Clotrimazole cream he only wants the Naftin 2% cream.  Spoke with pt and he refused to use any other medication than the Naftin generic he states he has been using since he became a Dr. Milinda Pointer pt.  Dr. Milinda Pointer refilled the Generic Naftin 2% Cream apply to affected areas bid +11 refills.

## 2015-09-01 MED ORDER — NAFTIFINE HCL 2 % EX CREA: TOPICAL_CREAM | CUTANEOUS | Status: DC

## 2015-09-11 ENCOUNTER — Ambulatory Visit (INDEPENDENT_AMBULATORY_CARE_PROVIDER_SITE_OTHER): Payer: Medicare Other | Admitting: Internal Medicine

## 2015-09-11 ENCOUNTER — Encounter: Payer: Self-pay | Admitting: Internal Medicine

## 2015-09-11 VITALS — BP 160/100 | HR 83 | Temp 98.0°F | Resp 20 | Ht 74.0 in | Wt 188.4 lb

## 2015-09-11 DIAGNOSIS — I10 Essential (primary) hypertension: Secondary | ICD-10-CM

## 2015-09-11 DIAGNOSIS — M5416 Radiculopathy, lumbar region: Secondary | ICD-10-CM | POA: Diagnosis not present

## 2015-09-11 MED ORDER — TIZANIDINE HCL 4 MG PO TABS
4.0000 mg | ORAL_TABLET | Freq: Four times a day (QID) | ORAL | Status: DC | PRN
Start: 2015-09-11 — End: 2015-10-02

## 2015-09-11 MED ORDER — METHYLPREDNISOLONE 4 MG PO TBPK: ORAL_TABLET | ORAL | Status: DC

## 2015-09-11 NOTE — Patient Instructions (Signed)
Continue hot compresses as needed to lower back. May alternate with cool compresses   Take naprosyn twice daily with food  Will call with xray results and Ortho appt  Go to the ER if loss of bowel/bladder control  Follow up as scheduled

## 2015-09-11 NOTE — Progress Notes (Signed)
Patient ID: ART LEVAN, male   DOB: 10/23/1951, 64 y.o.   MRN: [id number]    Location:    [facility]   Place of Service:  OFFICE   Chief Complaint  Patient presents with  . Acute Visit    Patients c/o Hip left pain this pain started about 3 weeks ago    HPI:  64 yo male seen today for acute visit. He c/o severe pain 7/10 on scale) in left lower back--> hip and radiates distally. Hx left hip fx. Tried heat pad, OTC naproxen, icy hot spray and "another pain pill" (tramadol) which caused diarrhea. He is still smoking. He began a part-time job last week and pain is worse. He is standing on feet for prolonged times. He has had an epidural several yrs ago in back for similar pain. Intermittent  numbness in left foot. Current pain worsens with standing >5 min at a time  HIV - stable on HAART tx. Followed by ID  HTN - taking all meds as ordered.   Past Medical History  Diagnosis Date  . Syphilis, unspecified   . Coronary atherosclerosis of native coronary artery     s/p surgery  . Type II or unspecified type diabetes mellitus without mention of complication, not stated as uncontrolled 2004    Diagnosed in 2004. Came to our clinic in 5/07 and was on Glucotrol XL 10 mg BID and metformin 500 BID. Max A1C on those two meds was 6.9 on 03/10/08. He has been able to be removed from the meds while maintaining a good A1C. He has DM neuropathy (although neuropathy could be multifactorial.)  . Human immunodeficiency virus (HIV) disease ([place])   . Allergic rhinitis, cause unspecified   . Gout, unspecified   . Hyperlipidemia   . HTN (hypertension)   . Ischemic cardiomyopathy   . Drug abuse     hx; tested for cocaine as recently as 2/08. says he is not using drugs now - avoided defib. for this reason   . Acute maxillary sinusitis   . Backache, unspecified   . Coronary atherosclerosis of artery bypass graft   . Closed fracture of intertrochanteric section of femur ([place])   . Chronic kidney disease,  unspecified ([place])   . Tobacco use disorder   . History of hepatitis C   . GERD (gastroesophageal reflux disease)   . Arthritis   . Hx of cardiovascular stress test     Lexiscan Myoview 3/16:  Lg ant-lateral and inf scar, no ischemia, EF 24%; high risk  . Hypernatriuria     Past Surgical History  Procedure Laterality Date  . Coronary artery bypass graft    . Left intertrochanteric hip fracture  2004    s/p intermedullary nail placement 2/08  . Left transverse mandibular fracture  10/05  . Laproscopic cholecystectomy  8/07  . Colonoscopy  2013    [place]     Patient Care Team: Gildardo Cranker, DO as PCP - General (Internal Medicine) [name], MD as PCP - Infectious Diseases (Infectious Diseases) [name], MD as Consulting Physician (Ophthalmology) [name], MD as Consulting Physician (Cardiology)  Social History   Social History  . Marital Status: Single    Spouse Name: N/A  . Number of Children: N/A  . Years of Education: N/A   Occupational History  . Not on file.   Social History Main Topics  . Smoking status: Current Every Day Smoker -- 0.50 packs/day for 36 years    Types:  Cigarettes  . Smokeless tobacco: Never Used  . Alcohol Use: 3.6 oz/week    6 Standard drinks or equivalent per week     Comment: 2 - 24 oz beers / week  . Drug Use: No     Comment: none since April 2016 (cocaine use)  . Sexual Activity:    Partners: Male     Comment: declined condoms   Other Topics Concern  . Not on file   Social History Narrative   Retired.       As of 05/2015:   Diet: No salt    Caffeine   Married: Single    House: Condo, 2 stories, 1 person (self)    Pets: No    Current/Past profession: N/A   Exercise: walks daily    Living Will: Yes    DNR   POA/HPOA: No            reports that he has been smoking Cigarettes.  He has a 18 pack-year smoking history. He has never used smokeless tobacco. He reports that he drinks about 3.6 oz of alcohol  per week. He reports that he does not use illicit drugs.  Allergies  Allergen Reactions  . Amphetamines Other (See Comments)    unknown    Medications: Patient's Medications  New Prescriptions   No medications on file  Previous Medications   ALLOPURINOL (ZYLOPRIM) 100 MG TABLET    Take 1 tablet (100 mg total) by mouth daily.   ANORO ELLIPTA 62.5-25 MCG/INH AEPB    Inhale 1 puff into the lungs daily.   ASPIRIN 81 MG TABLET    Take 81 mg by mouth daily.     ATORVASTATIN (LIPITOR) 40 MG TABLET    Take 40 mg by mouth daily.    B-D UF III MINI PEN NEEDLES 31G X 5 MM MISC    Use with insulin pen four times daily. Dx: E11.9   CARVEDILOL (COREG) 25 MG TABLET    Take 1 tablet (25 mg total) by mouth 2 (two) times daily with a meal.   COLCHICINE 0.6 MG TABLET    Take 1 tablet (0.6 mg total) by mouth continuous as needed. For gout.   ELVITEGRAVIR-COBICISTAT-EMTRICITABINE-TENOFOVIR (GENVOYA) 150-150-200-10 MG TABS TABLET    Take 1 tablet by mouth daily with breakfast.   GLUCOSAMINE-CHONDROIT-VIT C-MN (GLUCOSAMINE CHONDR 1500 COMPLX PO)    Take 2 tablets by mouth daily.    HYDRALAZINE (APRESOLINE) 25 MG TABLET    Take 1 tablet (25 mg total) by mouth 3 (three) times daily.   INSULIN GLARGINE (LANTUS SOLOSTAR) 100 UNIT/ML SOLOSTAR PEN    Inject 40 Units into the skin daily.   INSULIN LISPRO (HUMALOG KWIKPEN) 100 UNIT/ML KIWKPEN    Inject 8 units subcutaneous at meals to control blood sugar   ISOSORBIDE MONONITRATE (IMDUR) 30 MG 24 HR TABLET    Take 30 mg by mouth daily.   LATANOPROST (XALATAN) 0.005 % OPHTHALMIC SOLUTION    Place 1 drop into both eyes daily.   NAFTIFINE HCL 2 % CREA    Apply to affected area two times a day   NITROGLYCERIN (NITROSTAT) 0.3 MG SL TABLET    Place 1 tablet (0.3 mg total) under the tongue every 5 (five) minutes as needed.   TELMISARTAN (MICARDIS) 20 MG TABLET    Take 1 tablet (20 mg total) by mouth daily.  Modified Medications   No medications on file  Discontinued  Medications   No medications on file  Review of Systems  Gastrointestinal: Negative for abdominal pain.  Genitourinary: Negative for urgency and difficulty urinating.  Musculoskeletal: Positive for back pain, arthralgias and gait problem.  Neurological: Positive for numbness.  Psychiatric/Behavioral: Positive for sleep disturbance.  All other systems reviewed and are negative.   Filed Vitals:   09/11/15 1340  BP: 160/100  Pulse: 83  Temp: 98 F (36.7 C)  TempSrc: Oral  Resp: 20  Height: '6\' 2"'  (1.88 m)  Weight: 188 lb 6.4 oz (85.458 kg)  SpO2: 97%   Body mass index is 24.18 kg/(m^2).  Physical Exam  Constitutional: He is oriented to person, place, and time. He appears well-developed and well-nourished. No distress.  Musculoskeletal: He exhibits edema and tenderness.       Back:  Left hip with reduced internal rotation but no painon movement. No short leg. Neg SLR. Strength 4/5 in LLE but otherwise intact. DTRs intact. Gait antalgic  Neurological: He is alert and oriented to person, place, and time. He has normal reflexes.  Skin: Skin is warm and dry. No rash noted.  Psychiatric: He has a normal mood and affect. His behavior is normal. Thought content normal.     Labs reviewed: Office Visit on 08/07/2015  Component Date Value Ref Range Status  . WBC 08/07/2015 6.0  3.4 - 10.8 x10E3/uL Final  . RBC 08/07/2015 4.34  4.14 - 5.80 x10E6/uL Final  . Hemoglobin 08/07/2015 14.2  12.6 - 17.7 g/dL Final  . Hematocrit 08/07/2015 41.9  37.5 - 51.0 % Final  . MCV 08/07/2015 97  79 - 97 fL Final  . MCH 08/07/2015 32.7  26.6 - 33.0 pg Final  . MCHC 08/07/2015 33.9  31.5 - 35.7 g/dL Final  . RDW 08/07/2015 14.2  12.3 - 15.4 % Final  . Platelets 08/07/2015 157  150 - 379 x10E3/uL Final  . Neutrophils 08/07/2015 40   Final  . Lymphs 08/07/2015 50   Final  . Monocytes 08/07/2015 5   Final  . Eos 08/07/2015 4   Final  . Basos 08/07/2015 1   Final  . Neutrophils Absolute 08/07/2015  2.4  1.4 - 7.0 x10E3/uL Final  . Lymphocytes Absolute 08/07/2015 3.0  0.7 - 3.1 x10E3/uL Final  . Monocytes Absolute 08/07/2015 0.3  0.1 - 0.9 x10E3/uL Final  . EOS (ABSOLUTE) 08/07/2015 0.3  0.0 - 0.4 x10E3/uL Final  . Basophils Absolute 08/07/2015 0.1  0.0 - 0.2 x10E3/uL Final  . Immature Granulocytes 08/07/2015 0   Final  . Immature Grans (Abs) 08/07/2015 0.0  0.0 - 0.1 x10E3/uL Final  . Creatinine, Urine 08/07/2015 235.1  Not Estab. mg/dL Final  . Microalbum.,U,Random 08/07/2015 2686.2  Not Estab. ug/mL Final   Comment: Results confirmed on dilution.   [name] MICROALB/CREAT RATIO 08/07/2015 1142.6* 0.0 - 30.0 mg/g creat Final  . Hgb A1c MFr Bld 08/07/2015 8.5* 4.8 - 5.6 % Final   Comment:          Pre-diabetes: 5.7 - 6.4          Diabetes: >6.4          Glycemic control for adults with diabetes: <7.0   . Est. average glucose Bld gHb Est-m* 08/07/2015 197   Final  . Glucose 08/07/2015 125* 65 - 99 mg/dL Final  . BUN 08/07/2015 18  8 - 27 mg/dL Final  . Creatinine, Ser 08/07/2015 1.38* 0.76 - 1.27 mg/dL Final  . GFR calc non Af Amer 08/07/2015 53* >59 mL/min/1.73 Final  . GFR calc Af Wyvonnia Lora  08/07/2015 61  >59 mL/min/1.73 Final  . BUN/Creatinine Ratio 08/07/2015 13  10 - 22 Final  . Sodium 08/07/2015 138  134 - 144 mmol/L Final   Comment: **Effective August 10, 2015 the reference interval**   for Sodium, Serum will be changing to:                                             136 - 144   . Potassium 08/07/2015 4.2  3.5 - 5.2 mmol/L Final   Comment: **Effective August 10, 2015 the reference interval**   for Potassium, Serum will be changing to:                          0 -  7 days        3.7 - 5.2                          8 - 30 days        3.7 - 6.4                          1 -  6 months      3.8 - 6.0                   7 months -  1 year        3.8 - 5.3                              >1 year        3.5 - 5.2   . Chloride 08/07/2015 102  97 - 108 mmol/L Final   Comment: **Effective  August 10, 2015 the reference interval**   for Chloride, Serum will be changing to:                                              97 - 106   . CO2 08/07/2015 21  18 - 29 mmol/L Final  . Calcium 08/07/2015 9.4  8.6 - 10.2 mg/dL Final  . Total Protein 08/07/2015 7.8  6.0 - 8.5 g/dL Final  . Albumin 08/07/2015 3.8  3.6 - 4.8 g/dL Final  . Globulin, Total 08/07/2015 4.0  1.5 - 4.5 g/dL Final  . Albumin/Globulin Ratio 08/07/2015 1.0* 1.1 - 2.5 Final  . Bilirubin Total 08/07/2015 0.5  0.0 - 1.2 mg/dL Final  . Alkaline Phosphatase 08/07/2015 74  39 - 117 IU/L Final  . AST 08/07/2015 40  0 - 40 IU/L Final  . ALT 08/07/2015 52* 0 - 44 IU/L Final  . Cholesterol, Total 08/07/2015 121  100 - 199 mg/dL Final  . Triglycerides 08/07/2015 371* 0 - 149 mg/dL Final  . HDL 08/07/2015 32* >39 mg/dL Final   Comment: According to ATP-III Guidelines, HDL-C >59 mg/dL is considered a negative risk factor for CHD.   [name] VLDL Cholesterol Cal 08/07/2015 74* 5 - 40 mg/dL Final  . LDL Calculated 08/07/2015 15  0 - 99 mg/dL Final  . Chol/HDL Ratio 08/07/2015 3.8  0.0 - 5.0 ratio units Final   Comment:  T. Chol/HDL Ratio                                             Men  Women                               1/2 Avg.Risk  3.4    3.3                                   Avg.Risk  5.0    4.4                                2X Avg.Risk  9.6    7.1                                3X Avg.Risk 23.4   11.0   . TSH 08/07/2015 0.691  0.450 - 4.500 uIU/mL Final  . Prostate Specific Ag, Serum 08/07/2015 0.4  0.0 - 4.0 ng/mL Final   Comment: Roche ECLIA methodology. According to the American Urological Association, Serum PSA should decrease and remain at undetectable levels after radical prostatectomy. The AUA defines biochemical recurrence as an initial PSA value 0.2 ng/mL or greater followed by a subsequent confirmatory PSA value 0.2 ng/mL or greater. Values obtained with different assay methods or  kits cannot be used interchangeably. Results cannot be interpreted as absolute evidence of the presence or absence of malignant disease.   [name] HM Diabetic Eye Exam 08/04/2015 No Retinopathy  No Retinopathy Final  Lab on 06/30/2015  Component Date Value Ref Range Status  . CD4 T Cell Abs 06/30/2015 900  400 - 2700 /uL Final  . CD4 % Helper T Cell 06/30/2015 41  33 - 55 % Final   Performed at [facility]  . HIV 1 RNA Quant 06/30/2015 <20  <20 copies/mL Final   HIV 1 RNA not detected.  [name] HIV1 RNA Quant, Log 06/30/2015 <1.30  <1.30 log 10 Final   Comment:   This test utilizes the [place] FDA approved Roche HIV-1 Test Kit by RT-PCR.     [name] Sodium 06/30/2015 136  135 - 146 mmol/L Final  . Potassium 06/30/2015 5.0  3.5 - 5.3 mmol/L Final  . Chloride 06/30/2015 108  98 - 110 mmol/L Final  . CO2 06/30/2015 20  20 - 31 mmol/L Final  . Glucose, Bld 06/30/2015 384* 65 - 99 mg/dL Final  . BUN 06/30/2015 28* 7 - 25 mg/dL Final  . Creat 06/30/2015 1.49* 0.70 - 1.25 mg/dL Final  . Total Bilirubin 06/30/2015 0.4  0.2 - 1.2 mg/dL Final  . Alkaline Phosphatase 06/30/2015 64  40 - 115 U/L Final  . AST 06/30/2015 59* 10 - 35 U/L Final  . ALT 06/30/2015 62* 9 - 46 U/L Final  . Total Protein 06/30/2015 7.3  6.1 - 8.1 g/dL Final  . Albumin 06/30/2015 3.4* 3.6 - 5.1 g/dL Final  . Calcium 06/30/2015 8.8  8.6 - 10.3 mg/dL Final   Comment: ** Please note change in unit of measure and reference range(s). **     Admission on 06/25/2015, Discharged on 06/25/2015  Component Date Value Ref Range Status  . Glucose-Capillary 06/24/2015 352* 65 - 99 mg/dL Final  . Comment 1 06/24/2015 Notify RN   Final  . Comment 2 06/24/2015 Document in Chart   Final  . Sodium 06/24/2015 134* 135 - 145 mmol/L Final  . Potassium 06/24/2015 4.6  3.5 - 5.1 mmol/L Final  . Chloride 06/24/2015 108  101 - 111 mmol/L Final  . CO2 06/24/2015 19* 22 - 32 mmol/L Final  . Glucose, Bld 06/24/2015 361* 65 - 99 mg/dL Final  .  BUN 06/24/2015 34* 6 - 20 mg/dL Final  . Creatinine, Ser 06/24/2015 1.98* 0.61 - 1.24 mg/dL Final  . Calcium 06/24/2015 8.8* 8.9 - 10.3 mg/dL Final  . GFR calc non Af Amer 06/24/2015 33* >60 mL/min Final  . GFR calc Af Amer 06/24/2015 39* >60 mL/min Final   Comment: (NOTE) The eGFR has been calculated using the CKD EPI equation. This calculation has not been validated in all clinical situations. eGFR's persistently <60 mL/min signify possible Chronic Kidney Disease.   . Anion gap 06/24/2015 7  5 - 15 Final  . WBC 06/24/2015 5.2  4.0 - 10.5 K/uL Final  . RBC 06/24/2015 3.99* 4.22 - 5.81 MIL/uL Final  . Hemoglobin 06/24/2015 13.2  13.0 - 17.0 g/dL Final  . HCT 06/24/2015 37.5* 39.0 - 52.0 % Final  . MCV 06/24/2015 94.0  78.0 - 100.0 fL Final  . MCH 06/24/2015 33.1  26.0 - 34.0 pg Final  . MCHC 06/24/2015 35.2  30.0 - 36.0 g/dL Final  . RDW 06/24/2015 13.6  11.5 - 15.5 % Final  . Platelets 06/24/2015 152  150 - 400 K/uL Final  . Color, Urine 06/24/2015 YELLOW  YELLOW Final  . APPearance 06/24/2015 CLEAR  CLEAR Final  . Specific Gravity, Urine 06/24/2015 1.023  1.005 - 1.030 Final  . pH 06/24/2015 5.5  5.0 - 8.0 Final  . Glucose, UA 06/24/2015 >1000* NEGATIVE mg/dL Final  . Hgb urine dipstick 06/24/2015 NEGATIVE  NEGATIVE Final  . Bilirubin Urine 06/24/2015 NEGATIVE  NEGATIVE Final  . Ketones, ur 06/24/2015 NEGATIVE  NEGATIVE mg/dL Final  . Protein, ur 06/24/2015 100* NEGATIVE mg/dL Final  . Urobilinogen, UA 06/24/2015 0.2  0.0 - 1.0 mg/dL Final  . Nitrite 06/24/2015 NEGATIVE  NEGATIVE Final  . Leukocytes, UA 06/24/2015 NEGATIVE  NEGATIVE Final  . Glucose-Capillary 06/25/2015 313* 65 - 99 mg/dL Final  . Comment 1 06/25/2015 Notify RN   Final  . Squamous Epithelial / LPF 06/24/2015 RARE  RARE Final  . WBC, UA 06/24/2015 0-2  <3 WBC/hpf Final  . RBC / HPF 06/24/2015 0-2  <3 RBC/hpf Final  . Bacteria, UA 06/24/2015 RARE  RARE Final  . Glucose-Capillary 06/25/2015 216* 65 - 99 mg/dL  Final  Appointment on 06/22/2015  Component Date Value Ref Range Status  . Glucose 06/22/2015 256* 65 - 99 mg/dL Final  . BUN 06/22/2015 44* 8 - 27 mg/dL Final  . Creatinine, Ser 06/22/2015 1.90* 0.76 - 1.27 mg/dL Final  . GFR calc non Af Amer 06/22/2015 36* >59 mL/min/1.73 Final  . GFR calc Af Amer 06/22/2015 42* >59 mL/min/1.73 Final  . BUN/Creatinine Ratio 06/22/2015 23* 10 - 22 Final  . Sodium 06/22/2015 137  134 - 144 mmol/L Final  . Potassium 06/22/2015 5.2  3.5 - 5.2 mmol/L Final  . Chloride 06/22/2015 105  97 - 108 mmol/L Final  . CO2 06/22/2015 16* 18 - 29 mmol/L Final  . Calcium 06/22/2015 9.4  8.6 -  10.2 mg/dL Final  Office Visit on 06/17/2015  Component Date Value Ref Range Status  . Glucose 06/17/2015 138* 65 - 99 mg/dL Final  . BUN 06/17/2015 33* 8 - 27 mg/dL Final  . Creatinine, Ser 06/17/2015 1.69* 0.76 - 1.27 mg/dL Final  . GFR calc non Af Amer 06/17/2015 41* >59 mL/min/1.73 Final  . GFR calc Af Amer 06/17/2015 48* >59 mL/min/1.73 Final  . BUN/Creatinine Ratio 06/17/2015 20  10 - 22 Final  . Sodium 06/17/2015 135  134 - 144 mmol/L Final  . Potassium 06/17/2015 6.3* 3.5 - 5.2 mmol/L Final  . Chloride 06/17/2015 103  97 - 108 mmol/L Final  . CO2 06/17/2015 18  18 - 29 mmol/L Final   **Verified by repeat analysis**  . Calcium 06/17/2015 9.2  8.6 - 10.2 mg/dL Final    No results found.   Assessment/Plan   ICD-9-CM ICD-10-CM   1. Lumbar radiculopathy - worsening pain; no cauda equina sx's 724.4 M54.16 DG Lumbar Spine Complete     Ambulatory referral to Orthopedic Surgery     tiZANidine (ZANAFLEX) 4 MG tablet  2. Essential hypertension - elevated BP due to pain 401.9 I10     Continue hot compresses as needed to lower back. May alternate with cool compresses   Take naprosyn twice daily with food  Will call with xray results and Ortho appt  Go to the ER if loss of bowel/bladder control  Follow up as scheduled  Margy Sumler S. Perlie Gold    [facility] [address] [place], [place] [address] [phone] Cell (Monday-Friday 8 AM - 5 PM) [phone] After 5 PM and follow prompts

## 2015-09-14 ENCOUNTER — Ambulatory Visit
Admission: RE | Admit: 2015-09-14 | Discharge: 2015-09-14 | Disposition: A | Discharge status: Home / Self Care | Payer: Medicare Other | Source: Ambulatory Visit | Attending: Internal Medicine | Admitting: Internal Medicine

## 2015-09-14 DIAGNOSIS — M5416 Radiculopathy, lumbar region: Secondary | ICD-10-CM

## 2015-09-14 DIAGNOSIS — M5136 Other intervertebral disc degeneration, lumbar region: Secondary | ICD-10-CM | POA: Diagnosis not present

## 2015-09-16 ENCOUNTER — Telehealth: Payer: Self-pay

## 2015-09-16 NOTE — Telephone Encounter (Signed)
Patient called to find out what his results were from his x-rays before he would have his ortho consult appointment made told him the results of his x-rays and he agrees to go to ortho consult.

## 2015-09-23 ENCOUNTER — Other Ambulatory Visit: Payer: Self-pay | Admitting: Internal Medicine

## 2015-09-24 DIAGNOSIS — M5442 Lumbago with sciatica, left side: Secondary | ICD-10-CM | POA: Diagnosis not present

## 2015-09-25 ENCOUNTER — Other Ambulatory Visit: Payer: Self-pay | Admitting: Internal Medicine

## 2015-09-29 DIAGNOSIS — B20 Human immunodeficiency virus [HIV] disease: Secondary | ICD-10-CM | POA: Diagnosis not present

## 2015-09-29 DIAGNOSIS — Z1212 Encounter for screening for malignant neoplasm of rectum: Secondary | ICD-10-CM | POA: Diagnosis not present

## 2015-09-29 DIAGNOSIS — R85612 Low grade squamous intraepithelial lesion on cytologic smear of anus (LGSIL): Secondary | ICD-10-CM | POA: Diagnosis not present

## 2015-09-29 DIAGNOSIS — K6282 Dysplasia of anus: Secondary | ICD-10-CM | POA: Diagnosis not present

## 2015-09-29 DIAGNOSIS — Z8619 Personal history of other infectious and parasitic diseases: Secondary | ICD-10-CM | POA: Diagnosis not present

## 2015-09-29 DIAGNOSIS — R85610 Atypical squamous cells of undetermined significance on cytologic smear of anus (ASC-US): Secondary | ICD-10-CM | POA: Diagnosis not present

## 2015-10-01 DIAGNOSIS — M5416 Radiculopathy, lumbar region: Secondary | ICD-10-CM | POA: Diagnosis not present

## 2015-10-02 ENCOUNTER — Telehealth: Payer: Self-pay | Admitting: *Deleted

## 2015-10-02 DIAGNOSIS — M5416 Radiculopathy, lumbar region: Secondary | ICD-10-CM

## 2015-10-02 MED ORDER — TIZANIDINE HCL 4 MG PO TABS
4.0000 mg | ORAL_TABLET | Freq: Four times a day (QID) | ORAL | Status: DC | PRN
Start: 2015-10-02 — End: 2015-10-21

## 2015-10-02 NOTE — Telephone Encounter (Signed)
Ok to refill zanaflex (tizanidine) x 1

## 2015-10-02 NOTE — Telephone Encounter (Signed)
Patient called and wanted to know if you will prescribe him some more Zanaflex. He did have his shot on Thursday but still has some tightness. Please Advise.

## 2015-10-02 NOTE — Telephone Encounter (Signed)
Faxed to pharmacy

## 2015-10-07 ENCOUNTER — Telehealth: Payer: Self-pay | Admitting: *Deleted

## 2015-10-07 NOTE — Telephone Encounter (Signed)
He needs to contact Ortho regarding back pian. No Rx will be signed from this office at this time

## 2015-10-07 NOTE — Telephone Encounter (Signed)
Received call from the patient regarding need for something for pain, after going to the orthopedic doctor and receiving a epidural shot that is not working. He states that the pain is radiating from his back down though  his left leg. He would like for the the script to be sent to [facility], and will pick up from there. Please Advise!

## 2015-10-21 ENCOUNTER — Other Ambulatory Visit: Payer: Self-pay | Admitting: Internal Medicine

## 2015-10-27 DIAGNOSIS — M5416 Radiculopathy, lumbar region: Secondary | ICD-10-CM | POA: Diagnosis not present

## 2015-10-27 DIAGNOSIS — M5115 Intervertebral disc disorders with radiculopathy, thoracolumbar region: Secondary | ICD-10-CM | POA: Diagnosis not present

## 2015-10-29 ENCOUNTER — Other Ambulatory Visit: Payer: Medicare Other

## 2015-10-30 ENCOUNTER — Ambulatory Visit: Payer: Medicare Other

## 2015-10-30 ENCOUNTER — Other Ambulatory Visit: Payer: Medicare Other

## 2015-10-30 DIAGNOSIS — E1122 Type 2 diabetes mellitus with diabetic chronic kidney disease: Secondary | ICD-10-CM | POA: Diagnosis not present

## 2015-10-30 DIAGNOSIS — Z794 Long term (current) use of insulin: Secondary | ICD-10-CM | POA: Diagnosis not present

## 2015-10-31 LAB — BASIC METABOLIC PANEL
BUN/Creatinine Ratio: 30 — ABNORMAL HIGH (ref 10–22)
BUN: 61 mg/dL — ABNORMAL HIGH (ref 8–27)
CO2: 15 mmol/L — ABNORMAL LOW (ref 18–29)
Calcium: 9.2 mg/dL (ref 8.6–10.2)
Chloride: 103 mmol/L (ref 96–106)
Creatinine, Ser: 2.02 mg/dL — ABNORMAL HIGH (ref 0.76–1.27)
GFR calc Af Amer: 39 mL/min/{1.73_m2} — ABNORMAL LOW (ref >59)
GFR calc non Af Amer: 33 mL/min/{1.73_m2} — ABNORMAL LOW (ref >59)
Glucose: 215 mg/dL — ABNORMAL HIGH (ref 65–99)
Potassium: 5.9 mmol/L — ABNORMAL HIGH (ref 3.5–5.2)
Sodium: 137 mmol/L (ref 134–144)

## 2015-10-31 LAB — HEMOGLOBIN A1C
Est. average glucose Bld gHb Est-mCnc: 174 mg/dL
Hgb A1c MFr Bld: 7.7 % — ABNORMAL HIGH (ref 4.8–5.6)

## 2015-11-02 ENCOUNTER — Ambulatory Visit: Payer: Medicare Other | Admitting: Pharmacotherapy

## 2015-11-03 ENCOUNTER — Emergency Department (HOSPITAL_COMMUNITY): Payer: Medicare Other

## 2015-11-03 ENCOUNTER — Inpatient Hospital Stay (HOSPITAL_COMMUNITY)
Admission: EM | Admit: 2015-11-03 | Discharge: 2015-11-05 | DRG: 682 | Disposition: A | Discharge status: Home / Self Care | Payer: Medicare Other | Attending: Internal Medicine | Admitting: Internal Medicine

## 2015-11-03 ENCOUNTER — Encounter (HOSPITAL_COMMUNITY): Payer: Self-pay | Admitting: *Deleted

## 2015-11-03 DIAGNOSIS — K4020 Bilateral inguinal hernia, without obstruction or gangrene, not specified as recurrent: Secondary | ICD-10-CM | POA: Diagnosis not present

## 2015-11-03 DIAGNOSIS — F1020 Alcohol dependence, uncomplicated: Secondary | ICD-10-CM | POA: Diagnosis not present

## 2015-11-03 DIAGNOSIS — I255 Ischemic cardiomyopathy: Secondary | ICD-10-CM | POA: Diagnosis not present

## 2015-11-03 DIAGNOSIS — B20 Human immunodeficiency virus [HIV] disease: Secondary | ICD-10-CM | POA: Diagnosis present

## 2015-11-03 DIAGNOSIS — K219 Gastro-esophageal reflux disease without esophagitis: Secondary | ICD-10-CM | POA: Diagnosis present

## 2015-11-03 DIAGNOSIS — N183 Chronic kidney disease, stage 3 unspecified: Secondary | ICD-10-CM | POA: Diagnosis present

## 2015-11-03 DIAGNOSIS — E1140 Type 2 diabetes mellitus with diabetic neuropathy, unspecified: Secondary | ICD-10-CM | POA: Diagnosis not present

## 2015-11-03 DIAGNOSIS — I25810 Atherosclerosis of coronary artery bypass graft(s) without angina pectoris: Secondary | ICD-10-CM | POA: Diagnosis not present

## 2015-11-03 DIAGNOSIS — A084 Viral intestinal infection, unspecified: Secondary | ICD-10-CM | POA: Diagnosis not present

## 2015-11-03 DIAGNOSIS — R1111 Vomiting without nausea: Secondary | ICD-10-CM | POA: Diagnosis not present

## 2015-11-03 DIAGNOSIS — K2970 Gastritis, unspecified, without bleeding: Secondary | ICD-10-CM | POA: Diagnosis not present

## 2015-11-03 DIAGNOSIS — I10 Essential (primary) hypertension: Secondary | ICD-10-CM | POA: Diagnosis present

## 2015-11-03 DIAGNOSIS — I5042 Chronic combined systolic (congestive) and diastolic (congestive) heart failure: Secondary | ICD-10-CM | POA: Diagnosis present

## 2015-11-03 DIAGNOSIS — N179 Acute kidney failure, unspecified: Secondary | ICD-10-CM | POA: Diagnosis not present

## 2015-11-03 DIAGNOSIS — R109 Unspecified abdominal pain: Secondary | ICD-10-CM

## 2015-11-03 DIAGNOSIS — I2510 Atherosclerotic heart disease of native coronary artery without angina pectoris: Secondary | ICD-10-CM | POA: Diagnosis present

## 2015-11-03 DIAGNOSIS — E1165 Type 2 diabetes mellitus with hyperglycemia: Secondary | ICD-10-CM | POA: Diagnosis not present

## 2015-11-03 DIAGNOSIS — B1920 Unspecified viral hepatitis C without hepatic coma: Secondary | ICD-10-CM | POA: Diagnosis present

## 2015-11-03 DIAGNOSIS — I129 Hypertensive chronic kidney disease with stage 1 through stage 4 chronic kidney disease, or unspecified chronic kidney disease: Secondary | ICD-10-CM | POA: Diagnosis not present

## 2015-11-03 DIAGNOSIS — Z951 Presence of aortocoronary bypass graft: Secondary | ICD-10-CM | POA: Diagnosis not present

## 2015-11-03 DIAGNOSIS — E1122 Type 2 diabetes mellitus with diabetic chronic kidney disease: Secondary | ICD-10-CM | POA: Diagnosis not present

## 2015-11-03 DIAGNOSIS — E875 Hyperkalemia: Secondary | ICD-10-CM | POA: Diagnosis present

## 2015-11-03 DIAGNOSIS — R079 Chest pain, unspecified: Secondary | ICD-10-CM | POA: Diagnosis not present

## 2015-11-03 DIAGNOSIS — F1910 Other psychoactive substance abuse, uncomplicated: Secondary | ICD-10-CM | POA: Diagnosis present

## 2015-11-03 DIAGNOSIS — Z794 Long term (current) use of insulin: Secondary | ICD-10-CM

## 2015-11-03 DIAGNOSIS — F17210 Nicotine dependence, cigarettes, uncomplicated: Secondary | ICD-10-CM | POA: Diagnosis present

## 2015-11-03 DIAGNOSIS — R1032 Left lower quadrant pain: Secondary | ICD-10-CM | POA: Diagnosis not present

## 2015-11-03 DIAGNOSIS — Z7982 Long term (current) use of aspirin: Secondary | ICD-10-CM | POA: Diagnosis not present

## 2015-11-03 DIAGNOSIS — K5730 Diverticulosis of large intestine without perforation or abscess without bleeding: Secondary | ICD-10-CM | POA: Diagnosis not present

## 2015-11-03 DIAGNOSIS — E860 Dehydration: Secondary | ICD-10-CM | POA: Diagnosis not present

## 2015-11-03 DIAGNOSIS — E785 Hyperlipidemia, unspecified: Secondary | ICD-10-CM | POA: Diagnosis not present

## 2015-11-03 DIAGNOSIS — R1013 Epigastric pain: Secondary | ICD-10-CM | POA: Diagnosis not present

## 2015-11-03 LAB — CBC WITH DIFFERENTIAL/PLATELET
Basophils Absolute: 0 10*3/uL (ref 0.0–0.1)
Basophils Relative: 0 %
EOS ABS: 0 10*3/uL (ref 0.0–0.7)
EOS PCT: 0 %
HCT: 44 % (ref 39.0–52.0)
HEMOGLOBIN: 15.6 g/dL (ref 13.0–17.0)
LYMPHS ABS: 3.1 10*3/uL (ref 0.7–4.0)
Lymphocytes Relative: 33 %
MCH: 32.5 pg (ref 26.0–34.0)
MCHC: 35.5 g/dL (ref 30.0–36.0)
MCV: 91.7 fL (ref 78.0–100.0)
MONOS PCT: 6 %
Monocytes Absolute: 0.6 10*3/uL (ref 0.1–1.0)
Neutro Abs: 5.6 10*3/uL (ref 1.7–7.7)
Neutrophils Relative %: 61 %
PLATELETS: 175 10*3/uL (ref 150–400)
RBC: 4.8 MIL/uL (ref 4.22–5.81)
RDW: 13.3 % (ref 11.5–15.5)
WBC: 9.3 10*3/uL (ref 4.0–10.5)

## 2015-11-03 LAB — URINALYSIS, ROUTINE W REFLEX MICROSCOPIC
Bilirubin Urine: NEGATIVE
Glucose, UA: >1000 mg/dL — AB
Hgb urine dipstick: NEGATIVE
Ketones, ur: NEGATIVE mg/dL
LEUKOCYTES UA: NEGATIVE
NITRITE: NEGATIVE
PH: 5 (ref 5.0–8.0)
Protein, ur: NEGATIVE mg/dL
SPECIFIC GRAVITY, URINE: 1.019 (ref 1.005–1.030)

## 2015-11-03 LAB — COMPREHENSIVE METABOLIC PANEL
ALK PHOS: 59 U/L (ref 38–126)
ALT: 67 U/L — AB (ref 17–63)
AST: 47 U/L — AB (ref 15–41)
Albumin: 2.7 g/dL — ABNORMAL LOW (ref 3.5–5.0)
Anion gap: 7 (ref 5–15)
BUN: 65 mg/dL — AB (ref 6–20)
CALCIUM: 8.1 mg/dL — AB (ref 8.9–10.3)
CHLORIDE: 110 mmol/L (ref 101–111)
CO2: 16 mmol/L — AB (ref 22–32)
CREATININE: 2.73 mg/dL — AB (ref 0.61–1.24)
GFR calc non Af Amer: 23 mL/min — ABNORMAL LOW (ref >60)
GFR, EST AFRICAN AMERICAN: 26 mL/min — AB (ref >60)
Glucose, Bld: 390 mg/dL — ABNORMAL HIGH (ref 65–99)
Potassium: 6.2 mmol/L (ref 3.5–5.1)
SODIUM: 133 mmol/L — AB (ref 135–145)
Total Bilirubin: 0.6 mg/dL (ref 0.3–1.2)
Total Protein: 6.9 g/dL (ref 6.5–8.1)

## 2015-11-03 LAB — BASIC METABOLIC PANEL
ANION GAP: 10 (ref 5–15)
BUN: 58 mg/dL — ABNORMAL HIGH (ref 6–20)
CHLORIDE: 110 mmol/L (ref 101–111)
CO2: 14 mmol/L — AB (ref 22–32)
Calcium: 7.9 mg/dL — ABNORMAL LOW (ref 8.9–10.3)
Creatinine, Ser: 2.26 mg/dL — ABNORMAL HIGH (ref 0.61–1.24)
GFR calc Af Amer: 33 mL/min — ABNORMAL LOW (ref >60)
GFR calc non Af Amer: 29 mL/min — ABNORMAL LOW (ref >60)
GLUCOSE: 239 mg/dL — AB (ref 65–99)
POTASSIUM: 6.3 mmol/L — AB (ref 3.5–5.1)
Sodium: 134 mmol/L — ABNORMAL LOW (ref 135–145)

## 2015-11-03 LAB — URINE MICROSCOPIC-ADD ON: RBC / HPF: NONE SEEN RBC/hpf (ref 0–5)

## 2015-11-03 LAB — I-STAT TROPONIN, ED: Troponin i, poc: 0.02 ng/mL (ref 0.00–0.08)

## 2015-11-03 LAB — GLUCOSE, CAPILLARY: Glucose-Capillary: 232 mg/dL — ABNORMAL HIGH (ref 65–99)

## 2015-11-03 LAB — LIPASE, BLOOD: Lipase: 77 U/L — ABNORMAL HIGH (ref 11–51)

## 2015-11-03 LAB — POTASSIUM: Potassium: 6 mmol/L — ABNORMAL HIGH (ref 3.5–5.1)

## 2015-11-03 MED ORDER — THIAMINE HCL 100 MG/ML IJ SOLN
100.0000 mg | Freq: Every day | INTRAMUSCULAR | Status: DC
  Filled 2015-11-03: qty 2

## 2015-11-03 MED ORDER — INSULIN ASPART 100 UNIT/ML IV SOLN
10.0000 [IU] | Freq: Once | INTRAVENOUS | Status: AC
  Administered 2015-11-03: 10 [IU] via INTRAVENOUS

## 2015-11-03 MED ORDER — ACETAMINOPHEN 650 MG RE SUPP: 650.0000 mg | Freq: Four times a day (QID) | RECTAL | Status: DC | PRN

## 2015-11-03 MED ORDER — SODIUM CHLORIDE 0.9 % IV BOLUS (SEPSIS): 1000.0000 mL | Freq: Once | INTRAVENOUS | Status: DC

## 2015-11-03 MED ORDER — LATANOPROST 0.005 % OP SOLN
1.0000 [drp] | Freq: Every day | OPHTHALMIC | Status: DC
  Administered 2015-11-03 – 2015-11-04 (×2): 1 [drp] via OPHTHALMIC
  Filled 2015-11-03: qty 2.5

## 2015-11-03 MED ORDER — ASPIRIN EC 81 MG PO TBEC
81.0000 mg | DELAYED_RELEASE_TABLET | Freq: Every day | ORAL | Status: DC
  Administered 2015-11-04 – 2015-11-05 (×2): 81 mg via ORAL
  Filled 2015-11-03 (×2): qty 1

## 2015-11-03 MED ORDER — DEXTROSE 50 % IV SOLN
1.0000 | Freq: Once | INTRAVENOUS | Status: AC
  Administered 2015-11-03: 50 mL via INTRAVENOUS
  Filled 2015-11-03: qty 50

## 2015-11-03 MED ORDER — ONDANSETRON HCL 4 MG/2ML IJ SOLN: 4.0000 mg | Freq: Four times a day (QID) | INTRAMUSCULAR | Status: DC | PRN

## 2015-11-03 MED ORDER — ALLOPURINOL 100 MG PO TABS
100.0000 mg | ORAL_TABLET | Freq: Every day | ORAL | Status: DC
  Administered 2015-11-04 – 2015-11-05 (×2): 100 mg via ORAL
  Filled 2015-11-03 (×2): qty 1

## 2015-11-03 MED ORDER — ONDANSETRON HCL 4 MG PO TABS: 4.0000 mg | ORAL_TABLET | Freq: Four times a day (QID) | ORAL | Status: DC | PRN

## 2015-11-03 MED ORDER — VITAMIN B-1 100 MG PO TABS
100.0000 mg | ORAL_TABLET | Freq: Every day | ORAL | Status: DC
  Administered 2015-11-03 – 2015-11-05 (×3): 100 mg via ORAL
  Filled 2015-11-03 (×3): qty 1

## 2015-11-03 MED ORDER — INSULIN GLARGINE 100 UNIT/ML ~~LOC~~ SOLN
40.0000 [IU] | Freq: Every day | SUBCUTANEOUS | Status: DC
  Administered 2015-11-04: 40 [IU] via SUBCUTANEOUS
  Filled 2015-11-03 (×2): qty 0.4

## 2015-11-03 MED ORDER — FOLIC ACID 1 MG PO TABS
1.0000 mg | ORAL_TABLET | Freq: Every day | ORAL | Status: DC
  Administered 2015-11-03 – 2015-11-05 (×3): 1 mg via ORAL
  Filled 2015-11-03 (×3): qty 1

## 2015-11-03 MED ORDER — ACETAMINOPHEN 325 MG PO TABS: 650.0000 mg | ORAL_TABLET | Freq: Four times a day (QID) | ORAL | Status: DC | PRN

## 2015-11-03 MED ORDER — BARIUM SULFATE 2.1 % PO SUSP
ORAL | Status: AC
  Administered 2015-11-03: 900 mL via ORAL
  Filled 2015-11-03: qty 2

## 2015-11-03 MED ORDER — ELVITEG-COBIC-EMTRICIT-TENOFAF 150-150-200-10 MG PO TABS
1.0000 | ORAL_TABLET | Freq: Every day | ORAL | Status: DC
  Administered 2015-11-04 – 2015-11-05 (×2): 1 via ORAL
  Filled 2015-11-03 (×3): qty 1

## 2015-11-03 MED ORDER — INSULIN ASPART 100 UNIT/ML ~~LOC~~ SOLN
0.0000 [IU] | Freq: Three times a day (TID) | SUBCUTANEOUS | Status: DC
  Administered 2015-11-04: 3 [IU] via SUBCUTANEOUS
  Administered 2015-11-04: 5 [IU] via SUBCUTANEOUS
  Administered 2015-11-05: 2 [IU] via SUBCUTANEOUS

## 2015-11-03 MED ORDER — SODIUM CHLORIDE 0.9 % IV SOLN
Freq: Once | INTRAVENOUS | Status: AC
  Administered 2015-11-03: 14:00:00 via INTRAVENOUS

## 2015-11-03 MED ORDER — LORAZEPAM 2 MG/ML IJ SOLN: 1.0000 mg | Freq: Four times a day (QID) | INTRAMUSCULAR | Status: DC | PRN

## 2015-11-03 MED ORDER — SODIUM CHLORIDE 0.9 % IV SOLN
1.0000 g | Freq: Once | INTRAVENOUS | Status: AC
  Administered 2015-11-04: 1 g via INTRAVENOUS
  Filled 2015-11-03: qty 10

## 2015-11-03 MED ORDER — SODIUM CHLORIDE 0.9 % IV SOLN
INTRAVENOUS | Status: AC
Start: 2015-11-03 — End: 2015-11-04
  Administered 2015-11-03: 22:00:00 via INTRAVENOUS
  Filled 2015-11-03: qty 1000

## 2015-11-03 MED ORDER — BARIUM SULFATE 2.1 % PO SUSP
900.0000 mL | ORAL | Status: AC
  Administered 2015-11-03 (×2): 900 mL via ORAL

## 2015-11-03 MED ORDER — INSULIN ASPART 100 UNIT/ML ~~LOC~~ SOLN
0.0000 [IU] | Freq: Every day | SUBCUTANEOUS | Status: DC
  Administered 2015-11-03 – 2015-11-04 (×2): 2 [IU] via SUBCUTANEOUS

## 2015-11-03 MED ORDER — INSULIN ASPART 100 UNIT/ML IV SOLN
10.0000 [IU] | Freq: Once | INTRAVENOUS | Status: AC
  Administered 2015-11-03: 10 [IU] via INTRAVENOUS
  Filled 2015-11-03: qty 1

## 2015-11-03 MED ORDER — CARVEDILOL 12.5 MG PO TABS
12.5000 mg | ORAL_TABLET | Freq: Two times a day (BID) | ORAL | Status: DC
  Administered 2015-11-04 – 2015-11-05 (×3): 12.5 mg via ORAL
  Filled 2015-11-03 (×3): qty 1

## 2015-11-03 MED ORDER — ENOXAPARIN SODIUM 40 MG/0.4ML ~~LOC~~ SOLN
40.0000 mg | SUBCUTANEOUS | Status: DC
  Administered 2015-11-03 – 2015-11-04 (×2): 40 mg via SUBCUTANEOUS
  Filled 2015-11-03 (×2): qty 0.4

## 2015-11-03 MED ORDER — SODIUM CHLORIDE 0.9 % IV BOLUS (SEPSIS)
250.0000 mL | Freq: Once | INTRAVENOUS | Status: AC
  Administered 2015-11-03: 250 mL via INTRAVENOUS

## 2015-11-03 MED ORDER — ADULT MULTIVITAMIN W/MINERALS CH
1.0000 | ORAL_TABLET | Freq: Every day | ORAL | Status: DC
  Administered 2015-11-03 – 2015-11-05 (×3): 1 via ORAL
  Filled 2015-11-03 (×3): qty 1

## 2015-11-03 MED ORDER — LORAZEPAM 1 MG PO TABS: 1.0000 mg | ORAL_TABLET | Freq: Four times a day (QID) | ORAL | Status: DC | PRN

## 2015-11-03 MED ORDER — SODIUM CHLORIDE 0.9 % IV BOLUS (SEPSIS)
500.0000 mL | Freq: Once | INTRAVENOUS | Status: AC
  Administered 2015-11-03: 500 mL via INTRAVENOUS

## 2015-11-03 NOTE — ED Notes (Signed)
Pt states he has had no episodes of vomiting since Sunday, but has been nauseous since.

## 2015-11-03 NOTE — ED Notes (Signed)
Patient transported to CT 

## 2015-11-03 NOTE — ED Provider Notes (Signed)
CSN: [id number]     Arrival date & time 11/03/15  1104 History   First MD Initiated Contact with Patient 11/03/15 1105     Chief Complaint  Patient presents with  . Emesis   HPI Jacob [name] is a 65 y.o. M PMH significant for HIV (last CD4 count was 900 in November), syphilis, DM, HLD, HTN, CKD,  presenting with a 5 day history of nausea and generalized weakness. He had vomiting (NBNB) up until Sunday but this has since resolved. He endorses constipation as well. No fevers, chills, AMS, cough, slurred speech, chest pain, shortness of breath.  Past Medical History  Diagnosis Date  . Syphilis, unspecified   . Coronary atherosclerosis of native coronary artery     s/p surgery  . Type II or unspecified type diabetes mellitus without mention of complication, not stated as uncontrolled 2004    Diagnosed in 2004. Came to our clinic in 5/07 and was on Glucotrol XL 10 mg BID and metformin 500 BID. Max A1C on those two meds was 6.9 on 03/10/08. He has been able to be removed from the meds while maintaining a good A1C. He has DM neuropathy (although neuropathy could be multifactorial.)  . Human immunodeficiency virus (HIV) disease ([place])   . Allergic rhinitis, cause unspecified   . Gout, unspecified   . Hyperlipidemia   . HTN (hypertension)   . Ischemic cardiomyopathy   . Drug abuse     hx; tested for cocaine as recently as 2/08. says he is not using drugs now - avoided defib. for this reason   . Acute maxillary sinusitis   . Backache, unspecified   . Coronary atherosclerosis of artery bypass graft   . Closed fracture of intertrochanteric section of femur ([place])   . Chronic kidney disease, unspecified ([place])   . Tobacco use disorder   . History of hepatitis C   . GERD (gastroesophageal reflux disease)   . Arthritis   . Hx of cardiovascular stress test     Lexiscan Myoview 3/16:  Lg ant-lateral and inf scar, no ischemia, EF 24%; high risk  . Hypernatriuria    Past Surgical History  Procedure  Laterality Date  . Coronary artery bypass graft    . Left intertrochanteric hip fracture  2004    s/p intermedullary nail placement 2/08  . Left transverse mandibular fracture  10/05  . Laproscopic cholecystectomy  8/07  . Colonoscopy  2013    Dr.John Henrene Pastor    Family History  Problem Relation Age of Onset  . Heart failure Father   . Hypertension Father   . Diabetes Brother   . Heart attack Brother   . Colon cancer Neg Hx   . Alzheimer's disease Mother   . Stroke Sister   . Diabetes Sister   . Hypertension Brother   . Diabetes Brother   . Drug abuse Brother   . Alzheimer's disease Sister    Social History  Substance Use Topics  . Smoking status: Current Every Day Smoker -- 0.50 packs/day for 36 years    Types: Cigarettes  . Smokeless tobacco: Never Used  . Alcohol Use: 3.6 oz/week    6 Standard drinks or equivalent per week     Comment: 2 - 24 oz beers / week    Review of Systems  Ten systems are reviewed and are negative for acute change except as noted in the HPI  Allergies  Amphetamines  Home Medications   Prior to Admission medications  Medication Sig Start Date End Date Taking? Authorizing Provider  allopurinol (ZYLOPRIM) 100 MG tablet Take 1 tablet (100 mg total) by mouth daily. 08/10/15   Gildardo Cranker, DO  ANORO ELLIPTA 62.5-25 MCG/INH AEPB ONCE DAILY FOR BREATHING 09/23/15   Gildardo Cranker, DO  aspirin 81 MG tablet Take 81 mg by mouth daily.      Historical Provider, MD  atorvastatin (LIPITOR) 40 MG tablet take 1 tablet by mouth once daily 09/25/15   Gildardo Cranker, DO  B-D UF III MINI PEN NEEDLES 31G X 5 MM MISC Use with insulin pen four times daily. Dx: E11.9 08/14/15   Gildardo Cranker, DO  carvedilol (COREG) 25 MG tablet Take 1 tablet (25 mg total) by mouth 2 (two) times daily with a meal. 06/24/15   [name], MD  colchicine 0.6 MG tablet Take 1 tablet (0.6 mg total) by mouth continuous as needed. For gout. 06/17/15   Gildardo Cranker, DO   elvitegravir-cobicistat-emtricitabine-tenofovir (GENVOYA) 150-150-200-10 MG TABS tablet Take 1 tablet by mouth daily with breakfast. 01/05/15   [name], MD  Glucosamine-Chondroit-Vit C-Mn (GLUCOSAMINE CHONDR 1500 COMPLX PO) Take 2 tablets by mouth daily.     Historical Provider, MD  hydrALAZINE (APRESOLINE) 25 MG tablet Take 1 tablet (25 mg total) by mouth 3 (three) times daily. 06/24/15   [name], MD  Insulin Glargine (LANTUS SOLOSTAR) 100 UNIT/ML Solostar Pen Inject 40 Units into the skin daily. Patient taking differently: Inject 42 Units into the skin daily.  08/10/15   Gildardo Cranker, DO  insulin lispro (HUMALOG KWIKPEN) 100 UNIT/ML KiwkPen Inject 8 units subcutaneous at meals to control blood sugar 07/10/15   Tiffany L Reed, DO  isosorbide mononitrate (IMDUR) 30 MG 24 hr tablet Take 30 mg by mouth daily.    Historical Provider, MD  latanoprost (XALATAN) 0.005 % ophthalmic solution Place 1 drop into both eyes daily. 04/23/12   Historical Provider, MD  methylPREDNISolone (MEDROL DOSEPAK) 4 MG TBPK tablet Take as directed 09/11/15   Gildardo Cranker, DO  Naftifine HCl 2 % CREA Apply to affected area two times a day 09/01/15   Max T Hyatt, DPM  nitroGLYCERIN (NITROSTAT) 0.3 MG SL tablet Place 1 tablet (0.3 mg total) under the tongue every 5 (five) minutes as needed. 01/07/15   Liliane Shi, PA-C  telmisartan (MICARDIS) 20 MG tablet Take 1 tablet (20 mg total) by mouth daily. 06/17/15   Gildardo Cranker, DO  tiZANidine (ZANAFLEX) 4 MG tablet take 1 tablet by mouth every 6 hours if needed for MUSCLE SPASMS 10/21/15   Gildardo Cranker, DO   BP 140/88 mmHg  Pulse 95  Temp(Src) 97.5 F (36.4 C) (Oral)  Resp 20  Ht 6\' 2"  (1.88 m)  Wt 76.204 kg  BMI 21.56 kg/m2  SpO2 99% Physical Exam  Constitutional: He appears well-developed and well-nourished. No distress.  Disheveled appearance  HENT:  Head: Normocephalic and atraumatic.  Mouth/Throat: Oropharynx is clear and moist. No oropharyngeal exudate.   Eyes: Conjunctivae are normal. Pupils are equal, round, and reactive to light. Right eye exhibits no discharge. Left eye exhibits no discharge. No scleral icterus.  Neck: No tracheal deviation present.  Cardiovascular: Normal rate, regular rhythm, normal heart sounds and intact distal pulses.  Exam reveals no gallop and no friction rub.   No murmur heard. Pulmonary/Chest: Effort normal and breath sounds normal. No respiratory distress. He has no wheezes. He has no rales. He exhibits no tenderness.  Abdominal: Soft. Bowel sounds are normal. He exhibits  no distension and no mass. There is tenderness. There is no rebound and no guarding.  Epigastric tenderness and LLQ tenderness  Musculoskeletal: He exhibits no edema.  Lymphadenopathy:    He has no cervical adenopathy.  Neurological: He is alert. Coordination normal.  Skin: Skin is warm and dry. No rash noted. He is not diaphoretic. No erythema.  Psychiatric: He has a normal mood and affect. His behavior is normal.  Nursing note and vitals reviewed.   ED Course  Procedures  Labs Review Labs Reviewed  COMPREHENSIVE METABOLIC PANEL - Abnormal; Notable for the following:    Sodium 133 (*)    Potassium 6.2 (*)    CO2 16 (*)    Glucose, Bld 390 (*)    BUN 65 (*)    Creatinine, Ser 2.73 (*)    Calcium 8.1 (*)    Albumin 2.7 (*)    AST 47 (*)    ALT 67 (*)    GFR calc non Af Amer 23 (*)    GFR calc Af Amer 26 (*)    All other components within normal limits  LIPASE, BLOOD - Abnormal; Notable for the following:    Lipase 77 (*)    All other components within normal limits  POTASSIUM - Abnormal; Notable for the following:    Potassium 6.0 (*)    All other components within normal limits  URINALYSIS, ROUTINE W REFLEX MICROSCOPIC (NOT AT [facility]) - Abnormal; Notable for the following:    Glucose, UA >1000 (*)    All other components within normal limits  URINE MICROSCOPIC-ADD ON - Abnormal; Notable for the following:    Squamous  Epithelial / LPF 0-5 (*)    Bacteria, UA RARE (*)    Casts HYALINE CASTS (*)    All other components within normal limits  CBC WITH DIFFERENTIAL/PLATELET  I-STAT TROPOININ, ED   Imaging Review Ct Abdomen Pelvis Wo Contrast  11/03/2015  CLINICAL DATA:  Nausea and vomiting for 5 days. Abdominal pain, generalized weakness. EXAM: CT ABDOMEN AND PELVIS WITHOUT CONTRAST TECHNIQUE: Multidetector CT imaging of the abdomen and pelvis was performed following the standard protocol without IV contrast. COMPARISON:  06/14/2006 FINDINGS: Densely calcified visualized coronary arteries. Heart is normal size. Lung bases are clear. No effusions. Prior cholecystectomy. Small amount a pneumobilia noted within the common bile duct and central hepatic ducts. Liver, spleen, pancreas, adrenals and kidneys have an unremarkable unenhanced appearance. Appendix is normal. Stomach, large and small bowel unremarkable except for scattered sigmoid diverticula. No active diverticulitis. Aorta, branch vessels and iliac arteries are heavily calcified, non aneurysmal. No free fluid, free air or adenopathy. Urinary bladder is unremarkable. Bilateral inguinal hernias containing fat, left larger than right. No acute bony abnormality.  Degenerative disc changes at L5-S1. IMPRESSION: No acute findings in the abdomen or pelvis. Small amount of pneumobilia, presumably related to prior sphincterotomy. Prior cholecystectomy. Coronary artery disease. Densely calcified aorta and branch vessels. Sigmoid diverticulosis. Bilateral inguinal hernias containing fat. Electronically Signed   By: Rolm Baptise M.D.   On: 11/03/2015 15:20   Dg Chest 2 View  11/03/2015  CLINICAL DATA:  Feeling weak. Chest pain. Abdominal pain for 8 days. EXAM: CHEST  2 VIEW COMPARISON:  01/03/2015 FINDINGS: There is no focal parenchymal opacity. There is no pleural effusion or pneumothorax. The heart and mediastinal contours are unremarkable. There is evidence of prior CABG.  There is thoracic aortic atherosclerosis. There are multiple old right rib fractures. IMPRESSION: No active cardiopulmonary disease. Electronically Signed  By: Kathreen Devoid   On: 11/03/2015 12:32   I have personally reviewed and evaluated these images and lab results as part of my medical decision-making.   EKG Interpretation   Date/Time:  Tuesday November 03 2015 11:19:22 EST Ventricular Rate:  85 PR Interval:  161 QRS Duration: 95 QT Interval:  376 QTC Calculation: 447 R Axis:   -17 Text Interpretation:  Sinus rhythm Inferolateral infarct, old Sinus rhythm  Artifact Premature ventricular complexes T wave abnormality Abnormal ekg  Confirmed by Carmin Muskrat  MD [phone]) on 11/03/2015 11:24:50 AM      MDM   Final diagnoses:  Abdominal pain   Patient non-toxic appearing and VSS. Will perform broad workup for generalized weakness for infectious vs cardiac. Will CT abdomen given abdominal tenderness on exam.  Hyperkalemia of 6, hyperglycemia of 390, SCr of 2.73. SCr was 2.02 4 days ago.  CXR and CT abdomen/pelvis unremarkable for acute change.  Patient will need to be admitted for AKI and hyperkalemia.  Hospitalist agrees to admission. Patient can be safely admitted, and is in understanding and agreement with the plan.   [place] Lions, PA-C 11/03/15 2234  Carmin Muskrat, MD 11/04/15 [phone]

## 2015-11-03 NOTE — ED Notes (Signed)
Phlebotomy called to collect potassium.

## 2015-11-03 NOTE — ED Notes (Signed)
Phlebotomy unable to obtain blood specimen. Attempted to draw troponin from IV but unsuccessful.

## 2015-11-03 NOTE — Progress Notes (Signed)
CRITICAL VALUE ALERT  Critical value received: Potassium level of 6.3  Date of notification:  11/03/15  Time of notification:  23:02  Critical value read back:Yes.    Nurse who received alert:  Leandro Reasoner  MD notified (1st page):  Forrest Moron, NP  Time of first page:  23:05  MD notified (2nd page):  Time of second page:  Responding MD:  Forrest Moron, NP  Time MD responded:  23:19

## 2015-11-03 NOTE — H&P (Signed)
History and Physical  Jacob [name] [id number] DOB: May 21, 1951 DOA: 11/03/2015   PCP: Gildardo Cranker, DO  Referring Physician: ED/ Wendie Simmer, PA-C  Chief Complaint: generalized weakness, N/V  HPI:  65 year old male with a history of HIV, hepatitis C, hyperlipidemia diabetes mellitus, CAD, systolic and diastolic CHF, ischemic cardiomyopathy, alcohol dependence, tobacco abuse presented with one-week history of "squirts"with associated nausea and vomiting with abdominal pain for 3 days. The patient states that his last emesis was on the evening of 11/01/2015. However he continues to have "squirts", although they have been less frequent. He denies any hematemesis, hematochezia, melena. He has not had any fevers, chills, rashes, headache, neck pain. He states that he has been exposed to his sick grandson who has had cold-like symptoms. Since his nausea and vomiting have been better for over 24 hours, he tried eating a pork chop biscuit on the morning of 11/03/2015. He went to take a nap, but when he woke up he felt increasing generalized weakness to the point where he had difficulty getting up. As a result, he came to the emergency department for evaluation. He endorses compliance to all his medications. Further questioning reveals that he actually feels hungry and states that his vomiting and abdominal pain are getting better. In the emergency department, labs showed serum creatinine 2.73, bicarbonate 16, WBC 9.3. Potassium was 6.0. Because of his dehydration, admission was requested. Assessment/Plan: Acute on chronic renal failure -Secondary to dehydration in the setting of ARB use  -Certainly, his tenofovir or portion of his HIV regimen can cause nephrotoxicity, but this is less likely -The patient received 1 L normal saline in the emergency department -Continue judicious IV fluids--we'll give another liter -Urinalysis without significant pyuria  -discontinue telmisartan Baseline  creatinine 1.4-1.6 - Dehydration -Secondary to GI loss -Continue IV fluids as discussed Vomiting/diarrhea/abdominal pain -The patient likely has a viral gastroenteritis -He states that he is clinically improving as discussed above -Start with full liquid diet and advance as tolerated -Abdominal exam is benign -PRN antiemetics -CT abdomen and pelvis negative for acute findings Hyperkalemia -Secondary to acute on chronic renal failure with ARB use -Patient received Kayexalate in the emergency department -Repeat potassium -Anticipate improvement with IV fluids Ischemic cardiomyopathy/chronic systolic and diastolic CHF -Clinically compensated -09/08/2013 echo--EF 35-40 percent, grade 1 DD -Continue carvedilol at reduced dose -Hold hydralazine and Imdur--reassess in 24 hours for restart HIV -06/30/2015 CD4--900/41% -06/30/2015 HIV RNA<20 copies -Continue Genvoya--will need to adjust fixed dosing if renal function does not improve Diabetes mellitus type 2  -10/30/2015 hemoglobin A1c 7.7  -Start half home dose Lantus  -NovoLog sliding scale  -Monitor CBG and adjust as needed  Elevated lipase  -Nonspecific  -Likely related to the patient's acute on chronic renal failure  -No pancreatic stranding on CT abdomen pelvis  -No abdominal pain on exam  Alcohol dependence -He drinks 2 x 24 ounce beers daily and occasional liquor -CIWA Tobacco abuse -cessation discussed     Past Medical History  Diagnosis Date  . Syphilis, unspecified   . Coronary atherosclerosis of native coronary artery     s/p surgery  . Type II or unspecified type diabetes mellitus without mention of complication, not stated as uncontrolled 2004    Diagnosed in 2004. Came to our clinic in 5/07 and was on Glucotrol XL 10 mg BID and metformin 500 BID. Max A1C on those two meds was 6.9 on 03/10/08. He has been able to be removed from the meds while  maintaining a good A1C. He has DM neuropathy (although neuropathy  could be multifactorial.)  . Human immunodeficiency virus (HIV) disease ([place])   . Allergic rhinitis, cause unspecified   . Gout, unspecified   . Hyperlipidemia   . HTN (hypertension)   . Ischemic cardiomyopathy   . Drug abuse     hx; tested for cocaine as recently as 2/08. says he is not using drugs now - avoided defib. for this reason   . Acute maxillary sinusitis   . Backache, unspecified   . Coronary atherosclerosis of artery bypass graft   . Closed fracture of intertrochanteric section of femur ([place])   . Chronic kidney disease, unspecified ([place])   . Tobacco use disorder   . History of hepatitis C   . GERD (gastroesophageal reflux disease)   . Arthritis   . Hx of cardiovascular stress test     Lexiscan Myoview 3/16:  Lg ant-lateral and inf scar, no ischemia, EF 24%; high risk  . Hypernatriuria    Past Surgical History  Procedure Laterality Date  . Coronary artery bypass graft    . Left intertrochanteric hip fracture  2004    s/p intermedullary nail placement 2/08  . Left transverse mandibular fracture  10/05  . Laproscopic cholecystectomy  8/07  . Colonoscopy  2013    [place]    Social History:  reports that he has been smoking Cigarettes.  He has a 18 pack-year smoking history. He has never used smokeless tobacco. He reports that he drinks about 3.6 oz of alcohol per week. He reports that he does not use illicit drugs.   Family History  Problem Relation Age of Onset  . Heart failure Father   . Hypertension Father   . Diabetes Brother   . Heart attack Brother   . Colon cancer Neg Hx   . Alzheimer's disease Mother   . Stroke Sister   . Diabetes Sister   . Hypertension Brother   . Diabetes Brother   . Drug abuse Brother   . Alzheimer's disease Sister      Allergies  Allergen Reactions  . Amphetamines Other (See Comments)    unknown      Prior to Admission medications   Medication Sig Start Date End Date Taking? Authorizing Provider  allopurinol  (ZYLOPRIM) 100 MG tablet Take 1 tablet (100 mg total) by mouth daily. 08/10/15  Yes Monica Carter, DO  ANORO ELLIPTA 62.5-25 MCG/INH AEPB ONCE DAILY FOR BREATHING Patient taking differently: ONCE DAILY FOR BREATHING daily 09/23/15  Yes Gildardo Cranker, DO  aspirin 81 MG tablet Take 81 mg by mouth daily.     Yes Historical Provider, MD  atorvastatin (LIPITOR) 40 MG tablet take 1 tablet by mouth once daily 09/25/15  Yes Gildardo Cranker, DO  carvedilol (COREG) 25 MG tablet Take 1 tablet (25 mg total) by mouth 2 (two) times daily with a meal. 06/24/15  Yes [name], MD  colchicine 0.6 MG tablet Take 1 tablet (0.6 mg total) by mouth continuous as needed. For gout. 06/17/15  Yes Gildardo Cranker, DO  elvitegravir-cobicistat-emtricitabine-tenofovir (GENVOYA) 150-150-200-10 MG TABS tablet Take 1 tablet by mouth daily with breakfast. 01/05/15  Yes [name], MD  Glucosamine-Chondroit-Vit C-Mn (GLUCOSAMINE CHONDR 1500 COMPLX PO) Take 2 tablets by mouth daily.    Yes Historical Provider, MD  hydrALAZINE (APRESOLINE) 25 MG tablet Take 1 tablet (25 mg total) by mouth 3 (three) times daily. 06/24/15  Yes [name], MD  Insulin Glargine (LANTUS SOLOSTAR)  100 UNIT/ML Solostar Pen Inject 40 Units into the skin daily. Patient taking differently: Inject 42 Units into the skin daily.  08/10/15  Yes Gildardo Cranker, DO  insulin lispro (HUMALOG KWIKPEN) 100 UNIT/ML KiwkPen Inject 8 units subcutaneous at meals to control blood sugar 07/10/15  Yes Tiffany L Reed, DO  isosorbide mononitrate (IMDUR) 30 MG 24 hr tablet Take 30 mg by mouth daily.   Yes Historical Provider, MD  latanoprost (XALATAN) 0.005 % ophthalmic solution Place 1 drop into both eyes at bedtime.  04/23/12  Yes Historical Provider, MD  Naftifine HCl 2 % CREA Apply to affected area two times a day 09/01/15  Yes Max T Hyatt, DPM  nitroGLYCERIN (NITROSTAT) 0.3 MG SL tablet Place 1 tablet (0.3 mg total) under the tongue every 5 (five) minutes as needed. 01/07/15  Yes  Scott T Kathlen Mody, PA-C  telmisartan (MICARDIS) 20 MG tablet Take 1 tablet (20 mg total) by mouth daily. 06/17/15  Yes Gildardo Cranker, DO  tiZANidine (ZANAFLEX) 4 MG tablet take 1 tablet by mouth every 6 hours if needed for MUSCLE SPASMS 10/21/15  Yes Gildardo Cranker, DO  B-D UF III MINI PEN NEEDLES 31G X 5 MM MISC Use with insulin pen four times daily. Dx: E11.9 08/14/15   Gildardo Cranker, DO  methylPREDNISolone (MEDROL DOSEPAK) 4 MG TBPK tablet Take as directed 09/11/15   Gildardo Cranker, DO    Review of Systems:  Constitutional:  No weight loss, night sweats, Fevers, chills, fatigue.  Head&Eyes: No headache.  No vision loss.  No eye pain or scotoma ENT:  No Difficulty swallowing,Tooth/dental problems,Sore throat,  No ear ache, post nasal drip,  Cardio-vascular:  No chest pain, Orthopnea, PND, swelling in lower extremities,  dizziness, palpitations  GI:  No   loss of appetite, hematochezia, melena, heartburn, indigestion, Resp:  No shortness of breath with exertion or at rest. No cough. No coughing up of blood .No wheezing.No chest wall deformity  Skin:  no rash or lesions.  GU:  no dysuria, change in color of urine, no urgency or frequency. No flank pain.  Musculoskeletal:  No joint pain or swelling. No decreased range of motion. No back pain.  Psych:  No change in mood or affect. No depression or anxiety. Neurologic: No headache, no dysesthesia, no focal weakness, no vision loss. No syncope  Physical Exam: Filed Vitals:   11/03/15 1330 11/03/15 1345 11/03/15 1415 11/03/15 1430  BP: 126/83 130/79 140/96 140/88  Pulse: 96 97 91 95  Temp:      TempSrc:      Resp: 20 15 20 20   Height:      Weight:      SpO2: 100% 100% 100% 99%   General:  A&O x 3, NAD, nontoxic, pleasant/cooperative Head/Eye: No conjunctival hemorrhage, no icterus, [place]/AT, No nystagmus ENT:  No icterus,  No thrush, good dentition, no pharyngeal exudate Neck:  No masses, no lymphadenpathy, no bruits CV:  RRR, no  rub, no gallop, no S3 Lung:  CTAB, good air movement, no wheeze, no rhonchi Abdomen: soft/NT, +BS, nondistended, no peritoneal signs Ext: No cyanosis, No rashes, No petechiae, No lymphangitis, No edema Neuro: CNII-XII intact, strength 4/5 in bilateral upper and lower extremities, no dysmetria  Labs on Admission:  Basic Metabolic Panel:  Recent Labs Lab 10/30/15 0918 11/03/15 1125 11/03/15 1309  NA 137 133*  --   K 5.9* 6.2* 6.0*  CL 103 110  --   CO2 15* 16*  --   GLUCOSE 215* 390*  --  BUN 61* 65*  --   CREATININE 2.02* 2.73*  --   CALCIUM 9.2 8.1*  --    Liver Function Tests:  Recent Labs Lab 11/03/15 1125  AST 47*  ALT 67*  ALKPHOS 59  BILITOT 0.6  PROT 6.9  ALBUMIN 2.7*    Recent Labs Lab 11/03/15 1125  LIPASE 77*   No results for input(s): AMMONIA in the last 168 hours. CBC:  Recent Labs Lab 11/03/15 1125  WBC 9.3  NEUTROABS 5.6  HGB 15.6  HCT 44.0  MCV 91.7  PLT 175   Cardiac Enzymes: No results for input(s): CKTOTAL, CKMB, CKMBINDEX, TROPONINI in the last 168 hours. BNP: Invalid input(s): POCBNP CBG: No results for input(s): GLUCAP in the last 168 hours.  Radiological Exams on Admission: Ct Abdomen Pelvis Wo Contrast  11/03/2015  CLINICAL DATA:  Nausea and vomiting for 5 days. Abdominal pain, generalized weakness. EXAM: CT ABDOMEN AND PELVIS WITHOUT CONTRAST TECHNIQUE: Multidetector CT imaging of the abdomen and pelvis was performed following the standard protocol without IV contrast. COMPARISON:  06/14/2006 FINDINGS: Densely calcified visualized coronary arteries. Heart is normal size. Lung bases are clear. No effusions. Prior cholecystectomy. Small amount a pneumobilia noted within the common bile duct and central hepatic ducts. Liver, spleen, pancreas, adrenals and kidneys have an unremarkable unenhanced appearance. Appendix is normal. Stomach, large and small bowel unremarkable except for scattered sigmoid diverticula. No active  diverticulitis. Aorta, branch vessels and iliac arteries are heavily calcified, non aneurysmal. No free fluid, free air or adenopathy. Urinary bladder is unremarkable. Bilateral inguinal hernias containing fat, left larger than right. No acute bony abnormality.  Degenerative disc changes at L5-S1. IMPRESSION: No acute findings in the abdomen or pelvis. Small amount of pneumobilia, presumably related to prior sphincterotomy. Prior cholecystectomy. Coronary artery disease. Densely calcified aorta and branch vessels. Sigmoid diverticulosis. Bilateral inguinal hernias containing fat. Electronically Signed   By: Rolm Baptise M.D.   On: 11/03/2015 15:20   Dg Chest 2 View  11/03/2015  CLINICAL DATA:  Feeling weak. Chest pain. Abdominal pain for 8 days. EXAM: CHEST  2 VIEW COMPARISON:  01/03/2015 FINDINGS: There is no focal parenchymal opacity. There is no pleural effusion or pneumothorax. The heart and mediastinal contours are unremarkable. There is evidence of prior CABG. There is thoracic aortic atherosclerosis. There are multiple old right rib fractures. IMPRESSION: No active cardiopulmonary disease. Electronically Signed   By: Kathreen Devoid   On: 11/03/2015 12:32    EKG: Independently reviewed. Sinus rhythm, nonspecific ST changes    Time spent:60 minutes Code Status:   FULL Family Communication:   No Family at bedside   York Valliant, DO  [facility] Pager [phone]  If 7PM-7AM, please contact night-coverage [email or web address] Password [email or web address] 11/03/2015, 5:01 PM

## 2015-11-03 NOTE — ED Notes (Signed)
[place], [place] notified pt K is 6.2. Dr. Vanita Panda also notified.

## 2015-11-03 NOTE — ED Notes (Signed)
Patient transported to X-ray 

## 2015-11-03 NOTE — ED Notes (Signed)
Pt arrives from home via [facility]. Pt states he has been having n/v x5 days.

## 2015-11-04 LAB — GLUCOSE, CAPILLARY
GLUCOSE-CAPILLARY: 93 mg/dL (ref 65–99)
GLUCOSE-CAPILLARY: 202 mg/dL — AB (ref 65–99)
Glucose-Capillary: 249 mg/dL — ABNORMAL HIGH (ref 65–99)

## 2015-11-04 LAB — COMPREHENSIVE METABOLIC PANEL
ALT: 79 U/L — AB (ref 17–63)
ANION GAP: 5 (ref 5–15)
AST: 68 U/L — ABNORMAL HIGH (ref 15–41)
Albumin: 2.7 g/dL — ABNORMAL LOW (ref 3.5–5.0)
Alkaline Phosphatase: 59 U/L (ref 38–126)
BUN: 51 mg/dL — AB (ref 6–20)
CHLORIDE: 112 mmol/L — AB (ref 101–111)
CO2: 14 mmol/L — ABNORMAL LOW (ref 22–32)
CREATININE: 2.12 mg/dL — AB (ref 0.61–1.24)
Calcium: 8.3 mg/dL — ABNORMAL LOW (ref 8.9–10.3)
GFR, EST AFRICAN AMERICAN: 36 mL/min — AB (ref >60)
GFR, EST NON AFRICAN AMERICAN: 31 mL/min — AB (ref >60)
GLUCOSE: 87 mg/dL (ref 65–99)
Potassium: 5.9 mmol/L — ABNORMAL HIGH (ref 3.5–5.1)
SODIUM: 131 mmol/L — AB (ref 135–145)
TOTAL PROTEIN: 6.4 g/dL — AB (ref 6.5–8.1)
Total Bilirubin: 0.8 mg/dL (ref 0.3–1.2)

## 2015-11-04 MED ORDER — SODIUM POLYSTYRENE SULFONATE 15 GM/60ML PO SUSP
15.0000 g | Freq: Once | ORAL | Status: AC
  Administered 2015-11-04: 15 g via ORAL
  Filled 2015-11-04: qty 60

## 2015-11-04 MED ORDER — ENSURE ENLIVE PO LIQD
237.0000 mL | Freq: Two times a day (BID) | ORAL | Status: DC
  Administered 2015-11-04: 237 mL via ORAL

## 2015-11-04 MED ORDER — GLUCERNA SHAKE PO LIQD
237.0000 mL | ORAL | Status: DC
  Administered 2015-11-04: 237 mL via ORAL

## 2015-11-04 MED ORDER — SODIUM CHLORIDE 0.9 % IV SOLN
INTRAVENOUS | Status: DC
  Administered 2015-11-05: 05:00:00 via INTRAVENOUS

## 2015-11-04 MED ORDER — ISOSORBIDE MONONITRATE ER 30 MG PO TB24
15.0000 mg | ORAL_TABLET | Freq: Every day | ORAL | Status: DC
  Administered 2015-11-04 – 2015-11-05 (×2): 15 mg via ORAL
  Filled 2015-11-04 (×2): qty 1

## 2015-11-04 MED ORDER — HYDRALAZINE HCL 25 MG PO TABS
25.0000 mg | ORAL_TABLET | Freq: Three times a day (TID) | ORAL | Status: DC
  Administered 2015-11-04 – 2015-11-05 (×3): 25 mg via ORAL
  Filled 2015-11-04 (×3): qty 1

## 2015-11-04 NOTE — Progress Notes (Signed)
Initial Nutrition Assessment  DOCUMENTATION CODES:   Non-severe (moderate) malnutrition in context of acute illness/injury  INTERVENTION:   D/C Ensure Enlive  Glucerna Shake po daily as bedtime snack. Each supplement provides 220 kcal and 10 grams of protein  NUTRITION DIAGNOSIS:   Malnutrition related to acute illness as evidenced by percent weight loss, energy intake < 75% for > 7 days, mild depletion of muscle mass.  GOAL:   Patient will meet greater than or equal to 90% of their needs  MONITOR:   PO intake, Supplement acceptance, Labs, Weight trends, I & O's  REASON FOR ASSESSMENT:   Malnutrition Screening Tool   ASSESSMENT:   Pt with hx of HIV, Hepatitis C, DM, CHF, and ETOH dependence admitted with N/V/D x 1 week.   Medications reviewed and include: folic acid, mvi, thiamine Labs reviewed: sodium low (131), potassium elevated (5.9) CBG's: 232, 92 Nutrition-Focused physical exam completed. Findings are no fat depletion, mild muscle depletion (quad), and no edema.   Pt provides hx and reports that he has noticed weight loss in the last week due to diarrhea and abdominal pain. He typically eats well, 3 meals per day. He avoids adding extra salt or sugar to his diet and cooks meals at home. Pt is eating some high sodium foods (sausage, spam). For the last week pt has lost 10 lb (6% of his weight) and unable to eat due to abd pain.  Usual body weight per pt is 180 lb. Pt with some missing teeth but does not usually wear dentures while eating.  Reports feeling hungry today.   Diet Order:  Diet Carb Modified Fluid consistency:: Thin; Room service appropriate?: Yes  Skin:  Reviewed, no issues  Last BM:  1/10 - diarrhea  Height:   Ht Readings from Last 1 Encounters:  11/03/15 6\' 2"  (1.88 m)   Weight:   Wt Readings from Last 1 Encounters:  11/03/15 170 lb (77.111 kg)   Ideal Body Weight:  86.3 kg  BMI:  Body mass index is 21.82 kg/(m^2).  Estimated Nutritional  Needs:   Kcal:  2200-2400  Protein:  110-120 grams   Fluid:  >2 L/day   EDUCATION NEEDS:   No education needs identified at this time  [place], [place], [place] Pager [phone] After Hours Pager

## 2015-11-04 NOTE — Progress Notes (Signed)
Patient having loose stools, NP on call notified. Per NP, MD stated in his notes that diarrhea is likely due to viral gastroenteritis. No precautions or test ordered as a result.

## 2015-11-04 NOTE — Progress Notes (Signed)
[facility] PROGRESS NOTE  Jacob [name] [id number] DOB: Oct 22, 1951 DOA: 11/03/2015 PCP: Gildardo Cranker, DO  Assessment/Plan: Acute on chronic renal failure -Secondary to GI losses, in the setting of ARB use  -Certainly, his tenofovir or portion of his HIV regimen can cause nephrotoxicity, but less likely -Creatinine improving, 2.1 from 2.7 yesterday, baseline creatinine 1.4-1.6 -continue IVF today  Nausea/Vomiting/diarrhea/abdominal pain -suspect viral gastroenteritis -improving, advance diet -CT abdomen and pelvis negative for acute findings  Hyperkalemia -Secondary to acute on chronic renal failure with ARB use -Patient received Kayexalate on admission, repeat kayexalate today -FU Bmet  Ischemic cardiomyopathy/chronic systolic and diastolic CHF -Clinically compensated -09/08/2013 echo--EF 35-40% with grade 1 DD -Continue carvedilol at reduced dose, resume hydralazine and Imdur  HIV -06/30/2015 CD4--900/41% -06/30/2015 HIV RNA<20 copies -Continue HAART  Diabetes mellitus type 2  -10/30/2015 hemoglobin A1c 7.7  -Continue Lantus, SSI  Elevated lipase  -Nonspecific, not relevant in this setting -Likely related to the patient's acute on chronic renal failure   Alcohol dependence -counseled, thiamine, MVI -CIWA protocol, no withdrawal noted  Tobacco abuse -cessation discussed  DVT proph: lovenox  Code Status: Full Code Family Communication: None at bedside Disposition Plan: Home tomorrow if better   HPI/Subjective: Feels much better, N/V resolved, diarrhea last night, now improved  Objective: Filed Vitals:   11/04/15 0523 11/04/15 0900  BP: 145/86 165/91  Pulse: 70 84  Temp: 98.6 F (37 C) 98.7 F (37.1 C)  Resp: 16 16    Intake/Output Summary (Last 24 hours) at 11/04/15 1235 Last data filed at 11/04/15 1037  Gross per 24 hour  Intake 1376.25 ml  Output    201 ml  Net 1175.25 ml   Filed Weights   11/03/15 1111 11/03/15 1847   Weight: 76.204 kg (168 lb) 77.111 kg (170 lb)    Exam:   General:  AAOx3  Cardiovascular: S1S2/RRR  Respiratory: CTAB  Abdomen: soft, NT, BS present  Musculoskeletal: no edema c/c   Data Reviewed: Basic Metabolic Panel:  Recent Labs Lab 10/30/15 0918 11/03/15 1125 11/03/15 1309 11/03/15 2146 11/04/15 0508  NA 137 133*  --  134* 131*  K 5.9* 6.2* 6.0* 6.3* 5.9*  CL 103 110  --  110 112*  CO2 15* 16*  --  14* 14*  GLUCOSE 215* 390*  --  239* 87  BUN 61* 65*  --  58* 51*  CREATININE 2.02* 2.73*  --  2.26* 2.12*  CALCIUM 9.2 8.1*  --  7.9* 8.3*   Liver Function Tests:  Recent Labs Lab 11/03/15 1125 11/04/15 0508  AST 47* 68*  ALT 67* 79*  ALKPHOS 59 59  BILITOT 0.6 0.8  PROT 6.9 6.4*  ALBUMIN 2.7* 2.7*    Recent Labs Lab 11/03/15 1125  LIPASE 77*   No results for input(s): AMMONIA in the last 168 hours. CBC:  Recent Labs Lab 11/03/15 1125  WBC 9.3  NEUTROABS 5.6  HGB 15.6  HCT 44.0  MCV 91.7  PLT 175   Cardiac Enzymes: No results for input(s): CKTOTAL, CKMB, CKMBINDEX, TROPONINI in the last 168 hours. BNP (last 3 results) No results for input(s): BNP in the last 8760 hours.  ProBNP (last 3 results) No results for input(s): PROBNP in the last 8760 hours.  CBG:  Recent Labs Lab 11/03/15 2253 11/04/15 0751 11/04/15 1154  GLUCAP 232* 93 202*    No results found for this or any previous visit (from the past 240 hour(s)).   Studies: Ct Abdomen Pelvis Wo Contrast  11/03/2015  CLINICAL DATA:  Nausea and vomiting for 5 days. Abdominal pain, generalized weakness. EXAM: CT ABDOMEN AND PELVIS WITHOUT CONTRAST TECHNIQUE: Multidetector CT imaging of the abdomen and pelvis was performed following the standard protocol without IV contrast. COMPARISON:  06/14/2006 FINDINGS: Densely calcified visualized coronary arteries. Heart is normal size. Lung bases are clear. No effusions. Prior cholecystectomy. Small amount a pneumobilia noted within the  common bile duct and central hepatic ducts. Liver, spleen, pancreas, adrenals and kidneys have an unremarkable unenhanced appearance. Appendix is normal. Stomach, large and small bowel unremarkable except for scattered sigmoid diverticula. No active diverticulitis. Aorta, branch vessels and iliac arteries are heavily calcified, non aneurysmal. No free fluid, free air or adenopathy. Urinary bladder is unremarkable. Bilateral inguinal hernias containing fat, left larger than right. No acute bony abnormality.  Degenerative disc changes at L5-S1. IMPRESSION: No acute findings in the abdomen or pelvis. Small amount of pneumobilia, presumably related to prior sphincterotomy. Prior cholecystectomy. Coronary artery disease. Densely calcified aorta and branch vessels. Sigmoid diverticulosis. Bilateral inguinal hernias containing fat. Electronically Signed   By: Rolm Baptise M.D.   On: 11/03/2015 15:20   Dg Chest 2 View  11/03/2015  CLINICAL DATA:  Feeling weak. Chest pain. Abdominal pain for 8 days. EXAM: CHEST  2 VIEW COMPARISON:  01/03/2015 FINDINGS: There is no focal parenchymal opacity. There is no pleural effusion or pneumothorax. The heart and mediastinal contours are unremarkable. There is evidence of prior CABG. There is thoracic aortic atherosclerosis. There are multiple old right rib fractures. IMPRESSION: No active cardiopulmonary disease. Electronically Signed   By: Kathreen Devoid   On: 11/03/2015 12:32    Scheduled Meds: . allopurinol  100 mg Oral Daily  . aspirin EC  81 mg Oral Daily  . carvedilol  12.5 mg Oral BID WC  . elvitegravir-cobicistat-emtricitabine-tenofovir  1 tablet Oral Q breakfast  . enoxaparin (LOVENOX) injection  40 mg Subcutaneous Q24H  . feeding supplement (ENSURE ENLIVE)  237 mL Oral BID BM  . folic acid  1 mg Oral Daily  . insulin aspart  0-15 Units Subcutaneous TID WC  . insulin aspart  0-5 Units Subcutaneous QHS  . insulin glargine  40 Units Subcutaneous Daily  . latanoprost   1 drop Both Eyes QHS  . multivitamin with minerals  1 tablet Oral Daily  . sodium polystyrene  15 g Oral Once  . thiamine  100 mg Oral Daily   Continuous Infusions: . sodium chloride     Antibiotics Given (last 72 hours)    Date/Time Action Medication Dose   11/04/15 0841 Given   elvitegravir-cobicistat-emtricitabine-tenofovir (GENVOYA) 150-150-200-10 MG tablet 1 tablet 1 tablet      Active Problems:   Human immunodeficiency virus (HIV) disease ([place])   Essential hypertension   Polysubstance abuse   Ischemic cardiomyopathy   Acute renal failure superimposed on stage 3 chronic kidney disease ([place])   Dehydration   Alcohol dependence ([place])    Time spent: 43min    Bristal Steffy  [facility] Pager [phone]. If 7PM-7AM, please contact night-coverage at [email or web address], password [facility] 11/04/2015, 12:35 PM  LOS: 1 day

## 2015-11-04 NOTE — Progress Notes (Signed)
Utilization review completed. Ramonte Mena, RN, BSN. 

## 2015-11-05 ENCOUNTER — Other Ambulatory Visit: Payer: Self-pay | Admitting: *Deleted

## 2015-11-05 DIAGNOSIS — N183 Chronic kidney disease, stage 3 (moderate): Secondary | ICD-10-CM

## 2015-11-05 DIAGNOSIS — I255 Ischemic cardiomyopathy: Secondary | ICD-10-CM

## 2015-11-05 DIAGNOSIS — N179 Acute kidney failure, unspecified: Principal | ICD-10-CM

## 2015-11-05 DIAGNOSIS — E860 Dehydration: Secondary | ICD-10-CM

## 2015-11-05 DIAGNOSIS — F1910 Other psychoactive substance abuse, uncomplicated: Secondary | ICD-10-CM

## 2015-11-05 DIAGNOSIS — F1020 Alcohol dependence, uncomplicated: Secondary | ICD-10-CM

## 2015-11-05 DIAGNOSIS — I10 Essential (primary) hypertension: Secondary | ICD-10-CM

## 2015-11-05 DIAGNOSIS — B20 Human immunodeficiency virus [HIV] disease: Secondary | ICD-10-CM

## 2015-11-05 DIAGNOSIS — E875 Hyperkalemia: Secondary | ICD-10-CM

## 2015-11-05 LAB — BASIC METABOLIC PANEL
ANION GAP: 4 — AB (ref 5–15)
BUN: 36 mg/dL — ABNORMAL HIGH (ref 6–20)
CALCIUM: 8 mg/dL — AB (ref 8.9–10.3)
CHLORIDE: 113 mmol/L — AB (ref 101–111)
CO2: 19 mmol/L — ABNORMAL LOW (ref 22–32)
Creatinine, Ser: 1.88 mg/dL — ABNORMAL HIGH (ref 0.61–1.24)
GFR calc Af Amer: 41 mL/min — ABNORMAL LOW (ref >60)
GFR calc non Af Amer: 36 mL/min — ABNORMAL LOW (ref >60)
GLUCOSE: 163 mg/dL — AB (ref 65–99)
Potassium: 5.3 mmol/L — ABNORMAL HIGH (ref 3.5–5.1)
Sodium: 136 mmol/L (ref 135–145)

## 2015-11-05 LAB — CBC
HCT: 37.7 % — ABNORMAL LOW (ref 39.0–52.0)
HEMOGLOBIN: 13.1 g/dL (ref 13.0–17.0)
MCH: 32.6 pg (ref 26.0–34.0)
MCHC: 34.7 g/dL (ref 30.0–36.0)
MCV: 93.8 fL (ref 78.0–100.0)
PLATELETS: 155 10*3/uL (ref 150–400)
RBC: 4.02 MIL/uL — AB (ref 4.22–5.81)
RDW: 13.7 % (ref 11.5–15.5)
WBC: 6.4 10*3/uL (ref 4.0–10.5)

## 2015-11-05 LAB — GLUCOSE, CAPILLARY: Glucose-Capillary: 142 mg/dL — ABNORMAL HIGH (ref 65–99)

## 2015-11-05 NOTE — Progress Notes (Signed)
PT Cancellation Note  Patient Details Name: Jacob [name] MRN: [id number] DOB: [birth year]   Cancelled Treatment:    Reason Eval/Treat Not Completed: PT screened, no needs identified, will sign off   Patient is functioning at a high level of independence and no physical therapy is indicated at this time. Has been independent around the room. RN confirms. He has no questions or concerns. PT is signing-off. Please re-order if there is any significant change in status. Thank you for this referral.  Elayne Snare, [place]     Ellouise Newer 11/05/2015, 9:56 AM

## 2015-11-05 NOTE — Progress Notes (Signed)
Pt provided with discharge instructions including information on follow up appointments as well as new medications. Pt verbalized understanding of all information. IV was dc'd with no complication. VSS. Pt refused wheelchair out.

## 2015-11-05 NOTE — Discharge Summary (Signed)
Physician Discharge Summary  Jacob [name] [id number] DOB: 06-21-1951 DOA: 11/03/2015  PCP: Gildardo Cranker, DO  Admit date: 11/03/2015 Discharge date: 11/05/2015  Time spent: 38minutes  Recommendations for Outpatient Follow-up:  1. Bmet in 1-2 wks- holding Micardis   Discharge Condition: Stable   Discharge Diagnoses:  Principal Problem:   Acute renal failure superimposed on stage 3 chronic kidney disease (HCC) Active Problems:   Dehydration   Human immunodeficiency virus (HIV) disease ([place])   Essential hypertension   Polysubstance abuse   Ischemic cardiomyopathy   Alcohol dependence ([place])   History of present illness:  This is a 65 year old male with HIV, hepatitis C, hyperlipidemia, diabetes mellitus, systolic and diastolic heart failure with ischemic cardiomyopathy, alcohol dependence and tobacco abuse who presents with 1 week of diarrhea and nausea vomiting and abdominal pain. He is found to be significantly dehydrated with acute renal failure and therefore was admitted.  Hospital Course:  Acute renal failure/dehydration -ARB was held and will continue to be held for now until metabolic panel can be rechecked in a week -He was hydrated which resulted in steady improvement in his BUN and creatinine  Hyperkalemia -Treated with IV fluid hydration, Kayexalate and holding ARB  - Appears to have slightly elevated potassium at baseline-will need to continue to hold ARB and recheck a basic metabolic panel in 1 week before deciding to resume the ARB  Ischemic cardiomyopathy/chronic systolic and diastolic heart failure -Compensated  HIV -Continue Genvoya  Diabetes mellitus type 2 -Recent A1c was 7.7 -Continue home dose of insulin  Tobacco and alcohol dependence -Cessation discussed  Discharge Exam: Filed Weights   11/03/15 1111 11/03/15 1847  Weight: 76.204 kg (168 lb) 77.111 kg (170 lb)   Filed Vitals:   11/05/15 0502 11/05/15 0922  BP: 118/73 142/78  Pulse: 77  79  Temp: 99 F (37.2 C) 98.6 F (37 C)  Resp: 16 18    General: AAO x 3, no distress Cardiovascular: RRR, no murmurs  Respiratory: clear to auscultation bilaterally GI: soft, non-tender, non-distended, bowel sound positive  Discharge Instructions You were cared for by a hospitalist during your hospital stay. If you have any questions about your discharge medications or the care you received while you were in the hospital after you are discharged, you can call the unit and asked to speak with the hospitalist on call if the hospitalist that took care of you is not available. Once you are discharged, your primary care physician will handle any further medical issues. Please note that NO REFILLS for any discharge medications will be authorized once you are discharged, as it is imperative that you return to your primary care physician (or establish a relationship with a primary care physician if you do not have one) for your aftercare needs so that they can reassess your need for medications and monitor your lab values.      Discharge Instructions    Discharge instructions    Complete by:  As directed   Have Bmet checked in 2 wks- hold Micardis for now due to elevated potassium - continue diabetic, low sodium, low cholesterol diet.     Increase activity slowly    Complete by:  As directed             Medication List    STOP taking these medications        methylPREDNISolone 4 MG Tbpk tablet  Commonly known as:  MEDROL DOSEPAK     telmisartan 20 MG tablet  Commonly known  as:  MICARDIS      TAKE these medications        allopurinol 100 MG tablet  Commonly known as:  ZYLOPRIM  Take 1 tablet (100 mg total) by mouth daily.     ANORO ELLIPTA 62.5-25 MCG/INH Aepb  Generic drug:  Umeclidinium-Vilanterol  ONCE DAILY FOR BREATHING     aspirin 81 MG tablet  Take 81 mg by mouth daily.     atorvastatin 40 MG tablet  Commonly known as:  LIPITOR  take 1 tablet by mouth once daily      B-D UF III MINI PEN NEEDLES 31G X 5 MM Misc  Generic drug:  Insulin Pen Needle  Use with insulin pen four times daily. Dx: E11.9     carvedilol 25 MG tablet  Commonly known as:  COREG  Take 1 tablet (25 mg total) by mouth 2 (two) times daily with a meal.     colchicine 0.6 MG tablet  Take 1 tablet (0.6 mg total) by mouth continuous as needed. For gout.     elvitegravir-cobicistat-emtricitabine-tenofovir 150-150-200-10 MG Tabs tablet  Commonly known as:  GENVOYA  Take 1 tablet by mouth daily with breakfast.     GLUCOSAMINE CHONDR 1500 COMPLX PO  Take 2 tablets by mouth daily.     hydrALAZINE 25 MG tablet  Commonly known as:  APRESOLINE  Take 1 tablet (25 mg total) by mouth 3 (three) times daily.     Insulin Glargine 100 UNIT/ML Solostar Pen  Commonly known as:  LANTUS SOLOSTAR  Inject 40 Units into the skin daily.     insulin lispro 100 UNIT/ML KiwkPen  Commonly known as:  HUMALOG KWIKPEN  Inject 8 units subcutaneous at meals to control blood sugar     isosorbide mononitrate 30 MG 24 hr tablet  Commonly known as:  IMDUR  Take 30 mg by mouth daily.     latanoprost 0.005 % ophthalmic solution  Commonly known as:  XALATAN  Place 1 drop into both eyes at bedtime.     Naftifine HCl 2 % Crea  Apply to affected area two times a day     nitroGLYCERIN 0.3 MG SL tablet  Commonly known as:  NITROSTAT  Place 1 tablet (0.3 mg total) under the tongue every 5 (five) minutes as needed.     tiZANidine 4 MG tablet  Commonly known as:  ZANAFLEX  take 1 tablet by mouth every 6 hours if needed for MUSCLE SPASMS       Allergies  Allergen Reactions  . Amphetamines Other (See Comments)    unknown      The results of significant diagnostics from this hospitalization (including imaging, microbiology, ancillary and laboratory) are listed below for reference.    Significant Diagnostic Studies: Ct Abdomen Pelvis Wo Contrast  11/03/2015  CLINICAL DATA:  Nausea and vomiting for 5  days. Abdominal pain, generalized weakness. EXAM: CT ABDOMEN AND PELVIS WITHOUT CONTRAST TECHNIQUE: Multidetector CT imaging of the abdomen and pelvis was performed following the standard protocol without IV contrast. COMPARISON:  06/14/2006 FINDINGS: Densely calcified visualized coronary arteries. Heart is normal size. Lung bases are clear. No effusions. Prior cholecystectomy. Small amount a pneumobilia noted within the common bile duct and central hepatic ducts. Liver, spleen, pancreas, adrenals and kidneys have an unremarkable unenhanced appearance. Appendix is normal. Stomach, large and small bowel unremarkable except for scattered sigmoid diverticula. No active diverticulitis. Aorta, branch vessels and iliac arteries are heavily calcified, non aneurysmal. No free fluid, free air or  adenopathy. Urinary bladder is unremarkable. Bilateral inguinal hernias containing fat, left larger than right. No acute bony abnormality.  Degenerative disc changes at L5-S1. IMPRESSION: No acute findings in the abdomen or pelvis. Small amount of pneumobilia, presumably related to prior sphincterotomy. Prior cholecystectomy. Coronary artery disease. Densely calcified aorta and branch vessels. Sigmoid diverticulosis. Bilateral inguinal hernias containing fat. Electronically Signed   By: Rolm Baptise M.D.   On: 11/03/2015 15:20   Dg Chest 2 View  11/03/2015  CLINICAL DATA:  Feeling weak. Chest pain. Abdominal pain for 8 days. EXAM: CHEST  2 VIEW COMPARISON:  01/03/2015 FINDINGS: There is no focal parenchymal opacity. There is no pleural effusion or pneumothorax. The heart and mediastinal contours are unremarkable. There is evidence of prior CABG. There is thoracic aortic atherosclerosis. There are multiple old right rib fractures. IMPRESSION: No active cardiopulmonary disease. Electronically Signed   By: Kathreen Devoid   On: 11/03/2015 12:32    Microbiology: No results found for this or any previous visit (from the past 240  hour(s)).   Labs: Basic Metabolic Panel:  Recent Labs Lab 10/30/15 0918 11/03/15 1125 11/03/15 1309 11/03/15 2146 11/04/15 0508 11/05/15 0600  NA 137 133*  --  134* 131* 136  K 5.9* 6.2* 6.0* 6.3* 5.9* 5.3*  CL 103 110  --  110 112* 113*  CO2 15* 16*  --  14* 14* 19*  GLUCOSE 215* 390*  --  239* 87 163*  BUN 61* 65*  --  58* 51* 36*  CREATININE 2.02* 2.73*  --  2.26* 2.12* 1.88*  CALCIUM 9.2 8.1*  --  7.9* 8.3* 8.0*   Liver Function Tests:  Recent Labs Lab 11/03/15 1125 11/04/15 0508  AST 47* 68*  ALT 67* 79*  ALKPHOS 59 59  BILITOT 0.6 0.8  PROT 6.9 6.4*  ALBUMIN 2.7* 2.7*    Recent Labs Lab 11/03/15 1125  LIPASE 77*   No results for input(s): AMMONIA in the last 168 hours. CBC:  Recent Labs Lab 11/03/15 1125 11/05/15 0600  WBC 9.3 6.4  NEUTROABS 5.6  --   HGB 15.6 13.1  HCT 44.0 37.7*  MCV 91.7 93.8  PLT 175 155   Cardiac Enzymes: No results for input(s): CKTOTAL, CKMB, CKMBINDEX, TROPONINI in the last 168 hours. BNP: BNP (last 3 results) No results for input(s): BNP in the last 8760 hours.  ProBNP (last 3 results) No results for input(s): PROBNP in the last 8760 hours.  CBG:  Recent Labs Lab 11/03/15 2253 11/04/15 0751 11/04/15 1154 11/04/15 2150 11/05/15 0755  GLUCAP 232* 93 202* 249* 142*       SignedDebbe Odea, MD [facility] 11/05/2015, 9:49 AM

## 2015-11-11 DIAGNOSIS — E119 Type 2 diabetes mellitus without complications: Secondary | ICD-10-CM | POA: Diagnosis not present

## 2015-11-16 ENCOUNTER — Ambulatory Visit (INDEPENDENT_AMBULATORY_CARE_PROVIDER_SITE_OTHER): Payer: Medicare Other | Admitting: Pharmacotherapy

## 2015-11-16 ENCOUNTER — Encounter: Payer: Self-pay | Admitting: Pharmacotherapy

## 2015-11-16 VITALS — BP 116/70 | HR 73 | Temp 98.4°F | Ht 74.0 in | Wt 183.0 lb

## 2015-11-16 DIAGNOSIS — E0869 Diabetes mellitus due to underlying condition with other specified complication: Secondary | ICD-10-CM

## 2015-11-16 DIAGNOSIS — E0865 Diabetes mellitus due to underlying condition with hyperglycemia: Secondary | ICD-10-CM

## 2015-11-16 DIAGNOSIS — E875 Hyperkalemia: Secondary | ICD-10-CM

## 2015-11-16 DIAGNOSIS — IMO0002 Reserved for concepts with insufficient information to code with codable children: Secondary | ICD-10-CM

## 2015-11-16 DIAGNOSIS — I10 Essential (primary) hypertension: Secondary | ICD-10-CM

## 2015-11-16 NOTE — Progress Notes (Signed)
  Subjective:    Jacob [name] is a 65 y.o.African American male who presents for follow-up of Type 2 diabetes mellitus.   A1C improved to 7.7% (was 8.5%) Last labs had high potassium and SCr.  Need to recheck.  Went to hospital 11/05/15.  Was dehydrated and had acute renal failure  Feels much better. Micardis has been stopped. Drinking more water.  Average BG:  173m/dl Lowest BG: 1034mdl  Denies problems with vision. Wearing corrective lenses. Denies problems with feet. Denies peripheral edema. Nocturia once per night.  Lantus increased to 42 units while in the hospital.   Review of Systems A comprehensive review of systems was negative except for: Eyes: positive for contacts/glasses Genitourinary: positive for nocturia    Objective:    BP 116/70 mmHg  Pulse 73  Temp(Src) 98.4 F (36.9 C) (Oral)  Ht 6' 2"$  (1.88 m)  Wt 183 lb (83.008 kg)  BMI 23.49 kg/m2  SpO2 97%  General:  alert, cooperative and no distress  Oropharynx: normal findings: lips normal without lesions and gums healthy   Eyes:  negative findings: lids and lashes normal and conjunctivae and sclerae normal   Ears:  external ears normal        Lung: clear to auscultation bilaterally  Heart:  regular rate and rhythm     Extremities: extremities normal, atraumatic, no cyanosis or edema  Skin: warm and dry, no hyperpigmentation, vitiligo, or suspicious lesions     Neuro: mental status, speech normal, alert and oriented x3 and gait and station normal   Lab Review GLUCOSE (mg/dL)  Date Value  10/30/2015 215*  08/07/2015 125*  06/22/2015 256*   GLUCOSE, BLD (mg/dL)  Date Value  11/05/2015 163*  11/04/2015 87  11/03/2015 239*   CO2 (mmol/L)  Date Value  11/05/2015 19*  11/04/2015 14*  11/03/2015 14*   BUN (mg/dL)  Date Value  11/05/2015 36*  11/04/2015 51*  11/03/2015 58*  10/30/2015 61*  08/07/2015 18  06/22/2015 44*   CREAT (mg/dL)  Date Value  06/30/2015 1.49*  11/25/2014  1.54*  05/15/2014 1.49*   CREATININE, SER (mg/dL)  Date Value  11/05/2015 1.88*  11/04/2015 2.12*  11/03/2015 2.26*   BMP today    Assessment:    Diabetes Mellitus type II, under good control. Logbook indicates good control.  Recent increase in Lantus from hospital. BP at goal <140/90 Need to repeat BMP today   Plan:    1.  Rx changes: none 2.  Continue Lantus 42 units daily. 3.  Continue Humalog 8 units with each meal. 4.  Repeat BMP today - recent  Hospitalization with ARF / hyperkalemia.  Micardis D/C and labs corrected. 5.  BP at goal <140/90 6.  Exercise goal 30-45 minutes 5 x week. 7.  Counseled on nutrition goals.

## 2015-11-16 NOTE — Progress Notes (Signed)
[name], male   DOB: 1951/07/01, 65 y.o.   MRN: [id number]    Cardiology Office Note   Date:  11/19/2015   ID:  Jacob [name], DOB September 15, 1951, MRN [id number]  PCP:  Gildardo Cranker, DO  Cardiologist:  Dr. Jenkins Rouge   Electrophysiologist:  Dr. Virl Axe   Chief Complaint  Patient presents with  . Congestive Heart Failure    no sx     History of Present Illness: Jacob [name] is a 65 y.o. male with a hx of CAD, s/p CABG 2004, ischemic cardiomyopathy, HTN, HL, HIV, hepatitis C, CKD. Previously followed by Dr. Verl Blalock. Established with me in  10/2013. Cardiac MRI demonstrated EF < 35%. He was referred to Dr. Virl Axe for possible AICD. The patient ultimately declined implantation of ICD.   Seen in ED 3/12 with chest pain.  Symptoms were felt to be stable and outpatient workup recommended.  ED note indicates consult with Dr. Domenic Polite.  However, I cannot find a note from Dr. Domenic Polite in the chart (? Phone call).  He returns for FU.  The patient tells me that he's had left and right shoulder pain over the past month. It does radiate across his upper chest. He mainly has it at rest. He denies exertional symptoms. He has mild shortness of breath. He is NYHA 2. His dyspnea is not any worse. He denies exertional chest pain or jaw pain. He denies orthopnea PND or edema. He denies syncope. He had a recent URI. This is improved.  F/U myovue non ischemic  Large inferior and antero apical /lateral scars EF 24%  01/20/15   Studies: - LHC (04/2005): D1 occluded, proximal circumflex 70%, ostial RCA occluded, ramus branch of the circumflex occluded; SVG-ramus patent, SVG-RCA patent - Echo (08/2013): Mild LVH, EF 35-40%, inferior hypokinesis, grade 1 diastolic dysfunction, mild BAE. - Nuclear (01/13/14): Inferior and anterolateral infarct, no ischemia, EF 30%; high-risk   Cardiac MRI 11/2013 1) Moderate LVE with LVH EF 31% with multiple RWMAls as described above Consistent  with multi vessel CAD 2) Full thickness scar involving the lateral and inferolateral walls 3) Mild LAE 4) Normal RA/RV    Past Medical History  Diagnosis Date  . Syphilis, unspecified   . Coronary atherosclerosis of native coronary artery     s/p surgery  . Type II or unspecified type diabetes mellitus without mention of complication, not stated as uncontrolled 2004    Diagnosed in 2004. Came to our clinic in 5/07 and was on Glucotrol XL 10 mg BID and metformin 500 BID. Max A1C on those two meds was 6.9 on 03/10/08. He has been able to be removed from the meds while maintaining a good A1C. He has DM neuropathy (although neuropathy could be multifactorial.)  . Human immunodeficiency virus (HIV) disease ([place])   . Allergic rhinitis, cause unspecified   . Gout, unspecified   . Hyperlipidemia   . HTN (hypertension)   . Ischemic cardiomyopathy   . Drug abuse     hx; tested for cocaine as recently as 2/08. says he is not using drugs now - avoided defib. for this reason   . Acute maxillary sinusitis   . Backache, unspecified   . Coronary atherosclerosis of artery bypass graft   . Closed fracture of intertrochanteric section of femur ([place])   . Chronic kidney disease, unspecified ([place])   . Tobacco use disorder   . History of hepatitis C   . GERD (gastroesophageal reflux disease)   .  Arthritis   . Hx of cardiovascular stress test     Lexiscan Myoview 3/16:  Lg ant-lateral and inf scar, no ischemia, EF 24%; high risk  . Hypernatriuria     Past Surgical History  Procedure Laterality Date  . Coronary artery bypass graft    . Left intertrochanteric hip fracture  2004    s/p intermedullary nail placement 2/08  . Left transverse mandibular fracture  10/05  . Laproscopic cholecystectomy  8/07  . Colonoscopy  2013    [place]      Current Outpatient Prescriptions  Medication Sig Dispense Refill  . allopurinol (ZYLOPRIM) 100 MG tablet Take 1 tablet (100 mg total) by mouth daily. 30  tablet 6  . aspirin 81 MG tablet Take 81 mg by mouth daily.      [name] atorvastatin (LIPITOR) 40 MG tablet take 1 tablet by mouth once daily 30 tablet 5  . B-D UF III MINI PEN NEEDLES 31G X 5 MM MISC Use with insulin pen four times daily. Dx: E11.9 150 each 6  . carvedilol (COREG) 25 MG tablet Take 1 tablet (25 mg total) by mouth 2 (two) times daily with a meal. 180 tablet 1  . colchicine 0.6 MG tablet Take 0.6 mg by mouth daily.    [name] elvitegravir-cobicistat-emtricitabine-tenofovir (GENVOYA) 150-150-200-10 MG TABS tablet Take 1 tablet by mouth daily with breakfast. 30 tablet 11  . Glucosamine-Chondroit-Vit C-Mn (GLUCOSAMINE CHONDR 1500 COMPLX PO) Take 2 tablets by mouth daily.     . hydrALAZINE (APRESOLINE) 25 MG tablet Take 1 tablet (25 mg total) by mouth 3 (three) times daily. 90 tablet 6  . Insulin Glargine (LANTUS SOLOSTAR) 100 UNIT/ML Solostar Pen Inject 42 Units into the skin daily. 5 pen PRN  . insulin lispro (HUMALOG KWIKPEN) 100 UNIT/ML KiwkPen Inject 8 units subcutaneous at meals to control blood sugar 15 mL 11  . isosorbide mononitrate (IMDUR) 30 MG 24 hr tablet Take 30 mg by mouth daily.    [name] latanoprost (XALATAN) 0.005 % ophthalmic solution Place 1 drop into both eyes at bedtime.     . Naftifine HCl 2 % CREA Apply to affected area two times a day 1 Tube 11  . nitroGLYCERIN (NITROSTAT) 0.3 MG SL tablet Place 1 tablet (0.3 mg total) under the tongue every 5 (five) minutes as needed. 25 tablet 3  . tiZANidine (ZANAFLEX) 4 MG capsule Take 4 mg by mouth every 6 (six) hours as needed for muscle spasms.    . traMADol (ULTRAM) 50 MG tablet Take 50-100 mg by mouth every 6 (six) hours as needed for moderate pain or severe pain.    [name] Umeclidinium-Vilanterol (ANORO ELLIPTA) 62.5-25 MCG/INH AEPB Inhale 1 puff into the lungs daily.    . [DISCONTINUED] calcium carbonate (OS-CAL) 600 MG TABS Take 600 mg by mouth 2 (two) times daily with a meal.       No current facility-administered medications for this  visit.    Allergies:   Amphetamines    Social History:  The patient  reports that he has been smoking Cigarettes.  He has a 18 pack-year smoking history. He has never used smokeless tobacco. He reports that he drinks about 3.6 oz of alcohol per week. He reports that he does not use illicit drugs.   Family History:  The patient's family history includes Alzheimer's disease in his mother and sister; Diabetes in his brother, brother, and sister; Drug abuse in his brother; Heart attack in his brother; Heart failure in his father;  Hypertension in his brother and father; Stroke in his sister. There is no history of Colon cancer.    ROS:   Please see the history of present illness.   Review of Systems  Constitution: Positive for diaphoresis.  Cardiovascular: Positive for chest pain and dyspnea on exertion.  Respiratory: Positive for cough.   Musculoskeletal: Positive for myalgias.  All other systems reviewed and are negative.    PHYSICAL EXAM: VS:  BP 106/70 mmHg  Pulse 67  Ht 6\' 2"  (1.88 m)  Wt 83.063 kg (183 lb 1.9 oz)  BMI 23.50 kg/m2  SpO2 93%    Wt Readings from Last 3 Encounters:  11/19/15 83.063 kg (183 lb 1.9 oz)  11/16/15 83.008 kg (183 lb)  11/03/15 77.111 kg (170 lb)     GEN: Well nourished, well developed, in no acute distress HEENT: normal Neck: no JVD, no masses Cardiac:  Normal S1/S2, RRR; no murmur ,  no rubs or gallops, no edema  Respiratory:  clear to auscultation bilaterally, no wheezing, rhonchi or rales. GI: soft, nontender, nondistended, + BS MS: no deformity or atrophy Skin: warm and dry  Neuro:  CNs II-XII intact, Strength and sensation are intact Psych: Normal affect   EKG:  March 2016    NSR, HR 81, inf Q waves, TWI in 1, aVL, V4-6, no changes   Recent Labs: 08/07/2015: TSH 0.691 11/04/2015: ALT 79* 11/05/2015: Hemoglobin 13.1; Platelets 155 11/16/2015: BUN 18; Creatinine, Ser 1.41*; Potassium 4.9; Sodium 142    Lipid Panel    Component Value  Date/Time   CHOL 121 08/07/2015 1041   CHOL 106 11/25/2014 0949   TRIG 371* 08/07/2015 1041   HDL 32* 08/07/2015 1041   HDL 25* 11/25/2014 0949   CHOLHDL 3.8 08/07/2015 1041   CHOLHDL 4.2 11/25/2014 0949   VLDL 52* 11/25/2014 0949   LDLCALC 15 08/07/2015 1041   LDLCALC 29 11/25/2014 0949      ASSESSMENT AND PLAN:  Other chest pain Atypical f/u myovue non ischemic continue medical Rx   Coronary artery disease involving native coronary artery of native heart without angina pectoris  Continue aspirin, statin, beta blocker, ACE inhibitor.  Ischemic cardiomyopathy He has refused ICD implantation past. Continue beta blocker, ACE inhibitor, hydralazine. We had a long discussion about maximizing his CHF medications. He does not take any PDE-5 inhibitors. Start isosorbide 30 mg daily.  Chronic combined systolic and diastolic congestive heart failure Volume stable. He is NYHA 2.  Essential hypertension Controlled.  HLD (hyperlipidemia)  Continue statin.  Current medicines are reviewed at length with the patient today.  The patient does not have concerns regarding medicines.  The following changes have been made:  As above   Labs/ tests ordered today include:   No orders of the defined types were placed in this encounter.    Disposition:   FU with me in 6 months    Jenkins Rouge

## 2015-11-16 NOTE — Patient Instructions (Signed)
Continue Lantus 42 units daily Continue Humalog 8 units with meals.

## 2015-11-17 LAB — BASIC METABOLIC PANEL
BUN/Creatinine Ratio: 13 (ref 10–22)
BUN: 18 mg/dL (ref 8–27)
CO2: 20 mmol/L (ref 18–29)
Calcium: 8.8 mg/dL (ref 8.6–10.2)
Chloride: 106 mmol/L (ref 96–106)
Creatinine, Ser: 1.41 mg/dL — ABNORMAL HIGH (ref 0.76–1.27)
GFR calc Af Amer: 60 mL/min/{1.73_m2} (ref >59)
GFR calc non Af Amer: 52 mL/min/{1.73_m2} — ABNORMAL LOW (ref >59)
Glucose: 168 mg/dL — ABNORMAL HIGH (ref 65–99)
Potassium: 4.9 mmol/L (ref 3.5–5.2)
Sodium: 142 mmol/L (ref 134–144)

## 2015-11-19 ENCOUNTER — Encounter: Payer: Self-pay | Admitting: Cardiovascular Disease

## 2015-11-19 ENCOUNTER — Ambulatory Visit (INDEPENDENT_AMBULATORY_CARE_PROVIDER_SITE_OTHER): Payer: Medicare Other | Admitting: Cardiovascular Disease

## 2015-11-19 ENCOUNTER — Other Ambulatory Visit: Payer: Self-pay

## 2015-11-19 VITALS — BP 106/70 | HR 67 | Ht 74.0 in | Wt 183.1 lb

## 2015-11-19 DIAGNOSIS — I5022 Chronic systolic (congestive) heart failure: Secondary | ICD-10-CM | POA: Diagnosis not present

## 2015-11-19 DIAGNOSIS — I10 Essential (primary) hypertension: Secondary | ICD-10-CM | POA: Diagnosis not present

## 2015-11-19 MED ORDER — HYDRALAZINE HCL 25 MG PO TABS: 25.0000 mg | ORAL_TABLET | Freq: Three times a day (TID) | ORAL | Status: DC

## 2015-11-19 MED ORDER — ISOSORBIDE MONONITRATE ER 30 MG PO TB24: 30.0000 mg | ORAL_TABLET | Freq: Every day | ORAL | Status: DC

## 2015-11-19 MED ORDER — CARVEDILOL 25 MG PO TABS: 25.0000 mg | ORAL_TABLET | Freq: Two times a day (BID) | ORAL | Status: DC

## 2015-11-19 NOTE — Patient Instructions (Addendum)
Medication Instructions:  Your physician recommends that you continue on your current medications as directed. Please refer to the Current Medication list given to you today.  Labwork: NONE  Testing/Procedures: NONE  Follow-Up: Your physician wants you to follow-up in: 6 months with Dr. Nishan. You will receive a reminder letter in the mail two months in advance. If you don't receive a letter, please call our office to schedule the follow-up appointment.   If you need a refill on your cardiac medications before your next appointment, please call your pharmacy.    

## 2015-11-24 ENCOUNTER — Encounter: Payer: Self-pay | Admitting: Podiatry

## 2015-11-24 ENCOUNTER — Ambulatory Visit (INDEPENDENT_AMBULATORY_CARE_PROVIDER_SITE_OTHER): Payer: Medicare Other | Admitting: Podiatry

## 2015-11-24 DIAGNOSIS — Q828 Other specified congenital malformations of skin: Secondary | ICD-10-CM | POA: Diagnosis not present

## 2015-11-24 DIAGNOSIS — M79676 Pain in unspecified toe(s): Secondary | ICD-10-CM

## 2015-11-24 DIAGNOSIS — B351 Tinea unguium: Secondary | ICD-10-CM | POA: Diagnosis not present

## 2015-11-24 NOTE — Progress Notes (Addendum)
[name], male   DOB: 1951-07-04, 65 y.o.   MRN: [id number] Complaint:  Visit Type: Patient returns to my office for continued preventative foot care services. Complaint: Patient states" my nails have grown long and thick and become painful to walk and wear shoes" Patient has been diagnosed with DM with no foot complications. The patient presents for preventative foot care services. No changes to ROS  Podiatric Exam: Vascular: dorsalis pedis and posterior tibial pulses are palpable bilateral. Capillary return is immediate. Temperature gradient is WNL. Skin turgor WNL  Sensorium: Diminished  Semmes Weinstein monofilament test. Normal tactile sensation bilaterally. Nail Exam: Pt has thick disfigured discolored nails with subungual debris noted bilateral entire nail hallux through fifth toenails Ulcer Exam: There is no evidence of ulcer or pre-ulcerative changes or infection. Orthopedic Exam: Muscle tone and strength are WNL. No limitations in general ROM. No crepitus or effusions noted. Foot type and digits show no abnormalities. Bony prominences are unremarkable. Skin: No Porokeratosis. No infection or ulcers. Callus sub 5th B/L Callus  Diagnosis:  Onychomycosis, , Pain in right toe, pain in left toes,  Treatment & Plan Procedures and Treatment: Consent by patient was obtained for treatment procedures. The patient understood the discussion of treatment and procedures well. All questions were answered thoroughly reviewed. Debridement of mycotic and hypertrophic toenails, 1 through 5 bilateral and clearing of subungual debris. No ulceration, no infection noted. Marland Kitchenebride callus B/L Return Visit-Office Procedure: Patient instructed to return to the office for a follow up visit 3 months for continued evaluation and treatment.    Gardiner Barefoot DPM

## 2015-11-26 ENCOUNTER — Other Ambulatory Visit: Payer: Self-pay

## 2015-11-26 DIAGNOSIS — E0869 Diabetes mellitus due to underlying condition with other specified complication: Principal | ICD-10-CM

## 2015-11-26 DIAGNOSIS — E0865 Diabetes mellitus due to underlying condition with hyperglycemia: Secondary | ICD-10-CM

## 2015-11-26 DIAGNOSIS — IMO0002 Reserved for concepts with insufficient information to code with codable children: Secondary | ICD-10-CM

## 2015-11-26 MED ORDER — INSULIN GLARGINE 100 UNIT/ML SOLOSTAR PEN: 42.0000 [IU] | PEN_INJECTOR | Freq: Every day | SUBCUTANEOUS | Status: DC

## 2015-11-26 NOTE — Telephone Encounter (Signed)
Pharmacist called to clarify rx for lantus solostar. Previous rx stated 24 units and patient said he is using 42 units per pharmacist. I reviewed chart and we currently have lantus at 42 units. RX sent electronically to [facility] to reflect current dose.

## 2015-12-08 ENCOUNTER — Ambulatory Visit
Admission: RE | Admit: 2015-12-08 | Discharge: 2015-12-08 | Disposition: A | Discharge status: Home / Self Care | Payer: Medicare Other | Source: Ambulatory Visit | Attending: Sports Medicine | Admitting: Sports Medicine

## 2015-12-08 ENCOUNTER — Other Ambulatory Visit: Payer: Self-pay | Admitting: Internal Medicine

## 2015-12-08 ENCOUNTER — Other Ambulatory Visit: Payer: Self-pay | Admitting: Sports Medicine

## 2015-12-08 DIAGNOSIS — M25552 Pain in left hip: Secondary | ICD-10-CM

## 2015-12-09 ENCOUNTER — Ambulatory Visit: Payer: Medicare Other | Admitting: Internal Medicine

## 2015-12-11 ENCOUNTER — Encounter: Payer: Self-pay | Admitting: Internal Medicine

## 2015-12-11 ENCOUNTER — Ambulatory Visit (INDEPENDENT_AMBULATORY_CARE_PROVIDER_SITE_OTHER): Payer: Medicare Other | Admitting: Internal Medicine

## 2015-12-11 VITALS — BP 96/66 | HR 68 | Temp 97.6°F | Resp 20 | Ht 74.0 in | Wt 186.6 lb

## 2015-12-11 DIAGNOSIS — I10 Essential (primary) hypertension: Secondary | ICD-10-CM | POA: Diagnosis not present

## 2015-12-11 DIAGNOSIS — E1140 Type 2 diabetes mellitus with diabetic neuropathy, unspecified: Secondary | ICD-10-CM | POA: Diagnosis not present

## 2015-12-11 DIAGNOSIS — Z720 Tobacco use: Secondary | ICD-10-CM | POA: Diagnosis not present

## 2015-12-11 DIAGNOSIS — M25552 Pain in left hip: Secondary | ICD-10-CM | POA: Diagnosis not present

## 2015-12-11 DIAGNOSIS — B20 Human immunodeficiency virus [HIV] disease: Secondary | ICD-10-CM | POA: Diagnosis not present

## 2015-12-11 DIAGNOSIS — M5416 Radiculopathy, lumbar region: Secondary | ICD-10-CM | POA: Diagnosis not present

## 2015-12-11 DIAGNOSIS — E785 Hyperlipidemia, unspecified: Secondary | ICD-10-CM | POA: Diagnosis not present

## 2015-12-11 DIAGNOSIS — G8929 Other chronic pain: Secondary | ICD-10-CM | POA: Diagnosis not present

## 2015-12-11 DIAGNOSIS — I255 Ischemic cardiomyopathy: Secondary | ICD-10-CM | POA: Diagnosis not present

## 2015-12-11 DIAGNOSIS — Z794 Long term (current) use of insulin: Secondary | ICD-10-CM | POA: Diagnosis not present

## 2015-12-11 NOTE — Patient Instructions (Addendum)
Continue current medications as ordered   Follow up with specialists as scheduled  Smoking cessation discussed and highly urged  Follow up in 4 mos for routine visit.

## 2015-12-11 NOTE — Progress Notes (Signed)
[name], male   DOB: [birth year], 65 y.o.   MRN: [id number]    Location:    [facility]   Place of Service:  OFFICE   Chief Complaint  Patient presents with  . Medical Management of Chronic Issues    4 month follow-up for routine visit, Labs printed    HPI:  65 yo male seen today for f/u. He continues to have hip pain. He is followed by Ortho and will be scheduled for MRI soon. Taking zanaflex prn which helps. He was admitted to hospital with A/CKD with hyperkalemia and dehydration. Micardis was held. reeat BMP in office last month showed nml K  tob abuse - still smoking but down to 1/2 ppd  HIV - stable on HAART tx. Followed by ID Dr [name]  HTN - BP stable on hydralazine, core, imdur. Off ARB since hospital d/c   DM - BS stable at ome. 151 today. No low BS reactions. Numbness/tingling in feet. Takes insulin tx. Followed by podiatry and was last seen about 2 weeks ago. No ulcerations but has calluses. A1c 7.7%. He is not on ARB due to AKI and hyperkalemia. Eye appt next week  Past Medical History  Diagnosis Date  . Syphilis, unspecified   . Coronary atherosclerosis of native coronary artery     s/p surgery  . Type II or unspecified type diabetes mellitus without mention of complication, not stated as uncontrolled 2004    Diagnosed in 2004. Came to our clinic in 5/07 and was on Glucotrol XL 10 mg BID and metformin 500 BID. Max A1C on those two meds was 6.9 on 03/10/08. He has been able to be removed from the meds while maintaining a good A1C. He has DM neuropathy (although neuropathy could be multifactorial.)  . Human immunodeficiency virus (HIV) disease ([place])   . Allergic rhinitis, cause unspecified   . Gout, unspecified   . Hyperlipidemia   . HTN (hypertension)   . Ischemic cardiomyopathy   . Drug abuse     hx; tested for cocaine as recently as 2/08. says he is not using drugs now - avoided defib. for this reason   . Acute maxillary sinusitis   . Backache,  unspecified   . Coronary atherosclerosis of artery bypass graft   . Closed fracture of intertrochanteric section of femur ([place])   . Chronic kidney disease, unspecified ([place])   . Tobacco use disorder   . History of hepatitis C   . GERD (gastroesophageal reflux disease)   . Arthritis   . Hx of cardiovascular stress test     Lexiscan Myoview 3/16:  Lg ant-lateral and inf scar, no ischemia, EF 24%; high risk  . Hypernatriuria     Past Surgical History  Procedure Laterality Date  . Coronary artery bypass graft    . Left intertrochanteric hip fracture  2004    s/p intermedullary nail placement 2/08  . Left transverse mandibular fracture  10/05  . Laproscopic cholecystectomy  8/07  . Colonoscopy  2013    [place]     Patient Care Team: Gildardo Cranker, DO as PCP - General (Internal Medicine) [name], MD as PCP - Infectious Diseases (Infectious Diseases) [name], MD as Consulting Physician (Ophthalmology) [name], MD as Consulting Physician (Cardiology)  Social History   Social History  . Marital Status: Single    Spouse Name: N/A  . Number of Children: N/A  . Years of Education: N/A   Occupational History  .  Not on file.   Social History Main Topics  . Smoking status: Current Every Day Smoker -- 0.50 packs/day for 36 years    Types: Cigarettes  . Smokeless tobacco: Never Used  . Alcohol Use: 3.6 oz/week    6 Standard drinks or equivalent per week     Comment: 2 - 24 oz beers / week  . Drug Use: No     Comment: none since April 2016 (cocaine use)  . Sexual Activity:    Partners: Male     Comment: declined condoms   Other Topics Concern  . Not on file   Social History Narrative   Retired.       As of 05/2015:   Diet: No salt    Caffeine   Married: Single    House: Condo, 2 stories, 1 person (self)    Pets: No    Current/Past profession: N/A   Exercise: walks daily    Living Will: Yes    DNR   POA/HPOA: No            reports that  he has been smoking Cigarettes.  He has a 18 pack-year smoking history. He has never used smokeless tobacco. He reports that he drinks about 3.6 oz of alcohol per week. He reports that he does not use illicit drugs.  Allergies  Allergen Reactions  . Amphetamines Other (See Comments)    unknown    Medications: Patient's Medications  New Prescriptions   No medications on file  Previous Medications   ALLOPURINOL (ZYLOPRIM) 100 MG TABLET    Take 1 tablet (100 mg total) by mouth daily.   ASPIRIN 81 MG TABLET    Take 81 mg by mouth daily.     ATORVASTATIN (LIPITOR) 40 MG TABLET    take 1 tablet by mouth once daily   B-D UF III MINI PEN NEEDLES 31G X 5 MM MISC    Use with insulin pen four times daily. Dx: E11.9   CARVEDILOL (COREG) 25 MG TABLET    Take 1 tablet (25 mg total) by mouth 2 (two) times daily with a meal.   COLCHICINE 0.6 MG TABLET    Take 0.6 mg by mouth daily.   ELVITEGRAVIR-COBICISTAT-EMTRICITABINE-TENOFOVIR (GENVOYA) 150-150-200-10 MG TABS TABLET    Take 1 tablet by mouth daily with breakfast.   GLUCOSAMINE-CHONDROIT-VIT C-MN (GLUCOSAMINE CHONDR 1500 COMPLX PO)    Take 2 tablets by mouth daily.    HYDRALAZINE (APRESOLINE) 25 MG TABLET    Take 1 tablet (25 mg total) by mouth 3 (three) times daily.   INSULIN GLARGINE (LANTUS SOLOSTAR) 100 UNIT/ML SOLOSTAR PEN    Inject 42 Units into the skin daily. E11.22   INSULIN LISPRO (HUMALOG KWIKPEN) 100 UNIT/ML KIWKPEN    Inject 8 units subcutaneous at meals to control blood sugar   LATANOPROST (XALATAN) 0.005 % OPHTHALMIC SOLUTION    Place 1 drop into both eyes at bedtime.    NAFTIFINE HCL 2 % CREA    Apply to affected area two times a day   NITROGLYCERIN (NITROSTAT) 0.3 MG SL TABLET    Place 1 tablet (0.3 mg total) under the tongue every 5 (five) minutes as needed.   TIZANIDINE (ZANAFLEX) 4 MG TABLET    take 1 tablet by mouth every 6 hours if needed for muscle spasm   TRAMADOL (ULTRAM) 50 MG TABLET    Take 50-100 mg by mouth every 6 (six)  hours as needed for moderate pain or severe pain.   UMECLIDINIUM-VILANTEROL (  ANORO ELLIPTA) 62.5-25 MCG/INH AEPB    Inhale 1 puff into the lungs daily.  Modified Medications   No medications on file  Discontinued Medications   ISOSORBIDE MONONITRATE (IMDUR) 30 MG 24 HR TABLET    Take 1 tablet (30 mg total) by mouth daily.   TIZANIDINE (ZANAFLEX) 4 MG CAPSULE    Take 4 mg by mouth every 6 (six) hours as needed for muscle spasms.    Review of Systems  Constitutional: Positive for activity change.  Musculoskeletal: Positive for arthralgias and gait problem.  Neurological: Positive for numbness.  All other systems reviewed and are negative.   Filed Vitals:   12/11/15 0758  BP: 96/66  Pulse: 68  Temp: 97.6 F (36.4 C)  TempSrc: Oral  Resp: 20  Height: '6\' 2"'$  (1.88 m)  Weight: 186 lb 9.6 oz (84.641 kg)  SpO2: 98%   Body mass index is 23.95 kg/(m^2).  Physical Exam  Constitutional: He is oriented to person, place, and time. He appears well-developed and well-nourished.  HENT:  Mouth/Throat: Oropharynx is clear and moist.  Eyes: Pupils are equal, round, and reactive to light. No scleral icterus.  Neck: Neck supple. Carotid bruit is not present. No thyromegaly present.  Cardiovascular: Normal rate, regular rhythm, normal heart sounds and intact distal pulses.  Exam reveals no gallop and no friction rub.   No murmur heard. no distal LE swelling. No calf TTP  Pulmonary/Chest: Effort normal and breath sounds normal. He has no wheezes. He has no rales. He exhibits no tenderness.  Abdominal: Soft. Bowel sounds are normal. He exhibits no distension, no abdominal bruit, no pulsatile midline mass and no mass. There is no tenderness. There is no rebound and no guarding.  Musculoskeletal: He exhibits edema and tenderness.  Lymphadenopathy:    He has no cervical adenopathy.  Neurological: He is alert and oriented to person, place, and time.  Skin: Skin is warm and dry. No rash noted.    Psychiatric: He has a normal mood and affect. His behavior is normal. Judgment and thought content normal.     Labs reviewed: Office Visit on 11/16/2015  Component Date Value Ref Range Status  . Glucose 11/16/2015 168* 65 - 99 mg/dL Final  . BUN 11/16/2015 18  8 - 27 mg/dL Final  . Creatinine, Ser 11/16/2015 1.41* 0.76 - 1.27 mg/dL Final  . GFR calc non Af Amer 11/16/2015 52* >59 mL/min/1.73 Final  . GFR calc Af Amer 11/16/2015 60  >59 mL/min/1.73 Final  . BUN/Creatinine Ratio 11/16/2015 13  10 - 22 Final  . Sodium 11/16/2015 142  134 - 144 mmol/L Final  . Potassium 11/16/2015 4.9  3.5 - 5.2 mmol/L Final  . Chloride 11/16/2015 106  96 - 106 mmol/L Final  . CO2 11/16/2015 20  18 - 29 mmol/L Final  . Calcium 11/16/2015 8.8  8.6 - 10.2 mg/dL Final  Admission on 11/03/2015, Discharged on 11/05/2015  Component Date Value Ref Range Status  . WBC 11/03/2015 9.3  4.0 - 10.5 K/uL Final  . RBC 11/03/2015 4.80  4.22 - 5.81 MIL/uL Final  . Hemoglobin 11/03/2015 15.6  13.0 - 17.0 g/dL Final  . HCT 11/03/2015 44.0  39.0 - 52.0 % Final  . MCV 11/03/2015 91.7  78.0 - 100.0 fL Final  . MCH 11/03/2015 32.5  26.0 - 34.0 pg Final  . MCHC 11/03/2015 35.5  30.0 - 36.0 g/dL Final  . RDW 11/03/2015 13.3  11.5 - 15.5 % Final  . Platelets 11/03/2015 175  150 - 400 K/uL Final  . Neutrophils Relative % 11/03/2015 61   Final  . Neutro Abs 11/03/2015 5.6  1.7 - 7.7 K/uL Final  . Lymphocytes Relative 11/03/2015 33   Final  . Lymphs Abs 11/03/2015 3.1  0.7 - 4.0 K/uL Final  . Monocytes Relative 11/03/2015 6   Final  . Monocytes Absolute 11/03/2015 0.6  0.1 - 1.0 K/uL Final  . Eosinophils Relative 11/03/2015 0   Final  . Eosinophils Absolute 11/03/2015 0.0  0.0 - 0.7 K/uL Final  . Basophils Relative 11/03/2015 0   Final  . Basophils Absolute 11/03/2015 0.0  0.0 - 0.1 K/uL Final  . Sodium 11/03/2015 133* 135 - 145 mmol/L Final  . Potassium 11/03/2015 6.2* 3.5 - 5.1 mmol/L Final   Comment: CRITICAL RESULT  CALLED TO, READ BACK BY AND VERIFIED WITH: STEPHENS,N RN @ 1950 11/03/15 LEONARD,A   . Chloride 11/03/2015 110  101 - 111 mmol/L Final  . CO2 11/03/2015 16* 22 - 32 mmol/L Final  . Glucose, Bld 11/03/2015 390* 65 - 99 mg/dL Final  . BUN 11/03/2015 65* 6 - 20 mg/dL Final  . Creatinine, Ser 11/03/2015 2.73* 0.61 - 1.24 mg/dL Final  . Calcium 11/03/2015 8.1* 8.9 - 10.3 mg/dL Final  . Total Protein 11/03/2015 6.9  6.5 - 8.1 g/dL Final  . Albumin 11/03/2015 2.7* 3.5 - 5.0 g/dL Final  . AST 11/03/2015 47* 15 - 41 U/L Final  . ALT 11/03/2015 67* 17 - 63 U/L Final  . Alkaline Phosphatase 11/03/2015 59  38 - 126 U/L Final  . Total Bilirubin 11/03/2015 0.6  0.3 - 1.2 mg/dL Final  . GFR calc non Af Amer 11/03/2015 23* >60 mL/min Final  . GFR calc Af Amer 11/03/2015 26* >60 mL/min Final   Comment: (NOTE) The eGFR has been calculated using the CKD EPI equation. This calculation has not been validated in all clinical situations. eGFR's persistently <60 mL/min signify possible Chronic Kidney Disease.   . Anion gap 11/03/2015 7  5 - 15 Final  . Lipase 11/03/2015 77* 11 - 51 U/L Final  . Troponin i, poc 11/03/2015 0.02  0.00 - 0.08 ng/mL Final  . Comment 3 11/03/2015          Final   Comment: Due to the release kinetics of cTnI, a negative result within the first hours of the onset of symptoms does not rule out myocardial infarction with certainty. If myocardial infarction is still suspected, repeat the test at appropriate intervals.   . Potassium 11/03/2015 6.0* 3.5 - 5.1 mmol/L Final  . Color, Urine 11/03/2015 YELLOW  YELLOW Final  . APPearance 11/03/2015 CLEAR  CLEAR Final  . Specific Gravity, Urine 11/03/2015 1.019  1.005 - 1.030 Final  . pH 11/03/2015 5.0  5.0 - 8.0 Final  . Glucose, UA 11/03/2015 >1000* NEGATIVE mg/dL Final  . Hgb urine dipstick 11/03/2015 NEGATIVE  NEGATIVE Final  . Bilirubin Urine 11/03/2015 NEGATIVE  NEGATIVE Final  . Ketones, ur 11/03/2015 NEGATIVE  NEGATIVE mg/dL  Final  . Protein, ur 11/03/2015 NEGATIVE  NEGATIVE mg/dL Final  . Nitrite 11/03/2015 NEGATIVE  NEGATIVE Final  . Leukocytes, UA 11/03/2015 NEGATIVE  NEGATIVE Final  . Squamous Epithelial / LPF 11/03/2015 0-5* NONE SEEN Final  . WBC, UA 11/03/2015 0-5  0 - 5 WBC/hpf Final  . RBC / HPF 11/03/2015 NONE SEEN  0 - 5 RBC/hpf Final  . Bacteria, UA 11/03/2015 RARE* NONE SEEN Final  . Casts 11/03/2015 HYALINE CASTS* NEGATIVE Final  .  Sodium 11/03/2015 134* 135 - 145 mmol/L Final  . Potassium 11/03/2015 6.3* 3.5 - 5.1 mmol/L Final   Comment: CRITICAL RESULT CALLED TO, READ BACK BY AND VERIFIED WITH: M.BRIHAO,RN 2301 11/03/15 M.CAMPBELL   . Chloride 11/03/2015 110  101 - 111 mmol/L Final  . CO2 11/03/2015 14* 22 - 32 mmol/L Final  . Glucose, Bld 11/03/2015 239* 65 - 99 mg/dL Final  . BUN 11/03/2015 58* 6 - 20 mg/dL Final  . Creatinine, Ser 11/03/2015 2.26* 0.61 - 1.24 mg/dL Final  . Calcium 11/03/2015 7.9* 8.9 - 10.3 mg/dL Final  . GFR calc non Af Amer 11/03/2015 29* >60 mL/min Final  . GFR calc Af Amer 11/03/2015 33* >60 mL/min Final   Comment: (NOTE) The eGFR has been calculated using the CKD EPI equation. This calculation has not been validated in all clinical situations. eGFR's persistently <60 mL/min signify possible Chronic Kidney Disease.   . Anion gap 11/03/2015 10  5 - 15 Final  . Sodium 11/04/2015 131* 135 - 145 mmol/L Final  . Potassium 11/04/2015 5.9* 3.5 - 5.1 mmol/L Final  . Chloride 11/04/2015 112* 101 - 111 mmol/L Final  . CO2 11/04/2015 14* 22 - 32 mmol/L Final  . Glucose, Bld 11/04/2015 87  65 - 99 mg/dL Final  . BUN 11/04/2015 51* 6 - 20 mg/dL Final  . Creatinine, Ser 11/04/2015 2.12* 0.61 - 1.24 mg/dL Final  . Calcium 11/04/2015 8.3* 8.9 - 10.3 mg/dL Final  . Total Protein 11/04/2015 6.4* 6.5 - 8.1 g/dL Final  . Albumin 11/04/2015 2.7* 3.5 - 5.0 g/dL Final  . AST 11/04/2015 68* 15 - 41 U/L Final  . ALT 11/04/2015 79* 17 - 63 U/L Final  . Alkaline Phosphatase  11/04/2015 59  38 - 126 U/L Final  . Total Bilirubin 11/04/2015 0.8  0.3 - 1.2 mg/dL Final  . GFR calc non Af Amer 11/04/2015 31* >60 mL/min Final  . GFR calc Af Amer 11/04/2015 36* >60 mL/min Final   Comment: (NOTE) The eGFR has been calculated using the CKD EPI equation. This calculation has not been validated in all clinical situations. eGFR's persistently <60 mL/min signify possible Chronic Kidney Disease.   . Anion gap 11/04/2015 5  5 - 15 Final  . Glucose-Capillary 11/03/2015 232* 65 - 99 mg/dL Final  . Glucose-Capillary 11/04/2015 93  65 - 99 mg/dL Final  . Sodium 11/05/2015 136  135 - 145 mmol/L Final  . Potassium 11/05/2015 5.3* 3.5 - 5.1 mmol/L Final  . Chloride 11/05/2015 113* 101 - 111 mmol/L Final  . CO2 11/05/2015 19* 22 - 32 mmol/L Final  . Glucose, Bld 11/05/2015 163* 65 - 99 mg/dL Final  . BUN 11/05/2015 36* 6 - 20 mg/dL Final  . Creatinine, Ser 11/05/2015 1.88* 0.61 - 1.24 mg/dL Final  . Calcium 11/05/2015 8.0* 8.9 - 10.3 mg/dL Final  . GFR calc non Af Amer 11/05/2015 36* >60 mL/min Final  . GFR calc Af Amer 11/05/2015 41* >60 mL/min Final   Comment: (NOTE) The eGFR has been calculated using the CKD EPI equation. This calculation has not been validated in all clinical situations. eGFR's persistently <60 mL/min signify possible Chronic Kidney Disease.   . Anion gap 11/05/2015 4* 5 - 15 Final  . Glucose-Capillary 11/04/2015 202* 65 - 99 mg/dL Final  . WBC 11/05/2015 6.4  4.0 - 10.5 K/uL Final  . RBC 11/05/2015 4.02* 4.22 - 5.81 MIL/uL Final  . Hemoglobin 11/05/2015 13.1  13.0 - 17.0 g/dL Final  . HCT  11/05/2015 37.7* 39.0 - 52.0 % Final  . MCV 11/05/2015 93.8  78.0 - 100.0 fL Final  . MCH 11/05/2015 32.6  26.0 - 34.0 pg Final  . MCHC 11/05/2015 34.7  30.0 - 36.0 g/dL Final  . RDW 11/05/2015 13.7  11.5 - 15.5 % Final  . Platelets 11/05/2015 155  150 - 400 K/uL Final  . Glucose-Capillary 11/04/2015 249* 65 - 99 mg/dL Final  . Glucose-Capillary 11/05/2015 142* 65  - 99 mg/dL Final  Appointment on 10/30/2015  Component Date Value Ref Range Status  . Hgb A1c MFr Bld 10/30/2015 7.7* 4.8 - 5.6 % Final   Comment:          Pre-diabetes: 5.7 - 6.4          Diabetes: >6.4          Glycemic control for adults with diabetes: <7.0   . Est. average glucose Bld gHb Est-m* 10/30/2015 174   Final  . Glucose 10/30/2015 215* 65 - 99 mg/dL Final   Comment: Specimen received in contact with cells. No visible hemolysis present. However GLUC may be decreased and K increased. Clinical correlation indicated.   . BUN 10/30/2015 61* 8 - 27 mg/dL Final  . Creatinine, Ser 10/30/2015 2.02* 0.76 - 1.27 mg/dL Final  . GFR calc non Af Amer 10/30/2015 33* >59 mL/min/1.73 Final  . GFR calc Af Amer 10/30/2015 39* >59 mL/min/1.73 Final  . BUN/Creatinine Ratio 10/30/2015 30* 10 - 22 Final  . Sodium 10/30/2015 137  134 - 144 mmol/L Final  . Potassium 10/30/2015 5.9* 3.5 - 5.2 mmol/L Final  . Chloride 10/30/2015 103  96 - 106 mmol/L Final  . CO2 10/30/2015 15* 18 - 29 mmol/L Final  . Calcium 10/30/2015 9.2  8.6 - 10.2 mg/dL Final    Dg Hip Unilat With Pelvis 2-3 Views Left  12/08/2015  CLINICAL DATA:  Left hip pain for months. EXAM: DG HIP (WITH OR WITHOUT PELVIS) 2-3V LEFT COMPARISON:  11/03/2015 FINDINGS: Dynamic compression screw and intra medullary rod with a single distal interlocking screw is stable. There is no breakage or loosening of the hardware. No acute fracture. Mild joint space narrowing in the hip joint. Vascular calcifications. Thick periosteal reaction is present along the mid proximal femur. This is partially imaged. IMPRESSION: No acute bony pathology. Degenerative change. Postoperative change. Electronically Signed   By: Marybelle Killings M.D.   On: 12/08/2015 14:26     Assessment/Plan   ICD-9-CM ICD-10-CM   1. Type 2 diabetes mellitus with diabetic neuropathy, with long-term current use of insulin (HCC) 250.60 E11.40    357.2 Z79.4    V58.67    2. Essential  hypertension 401.9 I10   3. Chronic hip pain, left 719.45 M25.552    338.29 G89.29   4. Lumbar radiculopathy 724.4 M54.16   5. Ischemic cardiomyopathy 414.8 I25.5   6. HLD (hyperlipidemia) 272.4 E78.5 CANCELED: Lipid Panel  7. Tobacco abuse 305.1 Z72.0   8. Human immunodeficiency virus (HIV) disease ([place]) 042 B20    Continue current medications as ordered   Follow up with specialists as scheduled  Smoking cessation discussed and highly urged  Follow up in 4 mos for routine visit.   Keni Wafer S. Perlie Gold  [facility] [address] [place], [place] [address] [phone] Cell (Monday-Friday 8 AM - 5 PM) [phone] After 5 PM and follow prompts

## 2015-12-15 DIAGNOSIS — H02051 Trichiasis without entropian right upper eyelid: Secondary | ICD-10-CM | POA: Diagnosis not present

## 2015-12-15 DIAGNOSIS — H401122 Primary open-angle glaucoma, left eye, moderate stage: Secondary | ICD-10-CM | POA: Diagnosis not present

## 2015-12-15 DIAGNOSIS — H401112 Primary open-angle glaucoma, right eye, moderate stage: Secondary | ICD-10-CM | POA: Diagnosis not present

## 2015-12-15 DIAGNOSIS — H5213 Myopia, bilateral: Secondary | ICD-10-CM | POA: Diagnosis not present

## 2015-12-15 LAB — HM DIABETES EYE EXAM

## 2015-12-18 ENCOUNTER — Other Ambulatory Visit: Payer: Self-pay | Admitting: Physical Medicine and Rehabilitation

## 2015-12-18 DIAGNOSIS — M545 Low back pain: Secondary | ICD-10-CM

## 2015-12-21 ENCOUNTER — Telehealth: Payer: Self-pay | Admitting: *Deleted

## 2015-12-21 DIAGNOSIS — IMO0002 Reserved for concepts with insufficient information to code with codable children: Secondary | ICD-10-CM

## 2015-12-21 DIAGNOSIS — E0865 Diabetes mellitus due to underlying condition with hyperglycemia: Secondary | ICD-10-CM

## 2015-12-21 DIAGNOSIS — E0869 Diabetes mellitus due to underlying condition with other specified complication: Principal | ICD-10-CM

## 2015-12-21 MED ORDER — INSULIN GLARGINE 100 UNIT/ML SOLOSTAR PEN
45.0000 [IU] | PEN_INJECTOR | Freq: Every day | SUBCUTANEOUS | Status: DC
Start: 2015-12-21 — End: 2016-01-20

## 2015-12-21 NOTE — Telephone Encounter (Signed)
LMOM to return call.

## 2015-12-21 NOTE — Telephone Encounter (Signed)
It is possible that BS reacting to pain. Has he been feeling ill lately? How much lantus does he take?

## 2015-12-21 NOTE — Telephone Encounter (Signed)
Patient notified and agreed. Updated medication list.

## 2015-12-21 NOTE — Telephone Encounter (Signed)
Patient is taking Lantus 42 units and states he is not sick. Please Advise.

## 2015-12-21 NOTE — Telephone Encounter (Signed)
Patient called and stated that he had concerns with his blood sugars Increasing. This morning it was 181 and since gone up to 210. Has been running around these numbers for about 3 weeks. Taking medication as prescribed. Patient is wondering if it is coming from his pain. Please Advise.   2/18-165 2/19-173 2/20-169 2/21-189 2/22-172 2/23-171 2/24-176 2/26-158 2/27-181 (Avory Rahimi leave message on Voicemail)

## 2015-12-21 NOTE — Telephone Encounter (Signed)
increase lantus to 45 units qhs. Cont accu checks BID. Call if no better. Probably elevated due to pain

## 2015-12-29 ENCOUNTER — Other Ambulatory Visit: Payer: Medicare Other

## 2015-12-29 ENCOUNTER — Ambulatory Visit
Admission: RE | Admit: 2015-12-29 | Discharge: 2015-12-29 | Disposition: A | Discharge status: Home / Self Care | Payer: Medicare Other | Source: Ambulatory Visit | Attending: Physical Medicine and Rehabilitation | Admitting: Physical Medicine and Rehabilitation

## 2015-12-29 DIAGNOSIS — Z79899 Other long term (current) drug therapy: Secondary | ICD-10-CM | POA: Diagnosis not present

## 2015-12-29 DIAGNOSIS — M4806 Spinal stenosis, lumbar region: Secondary | ICD-10-CM | POA: Diagnosis not present

## 2015-12-29 DIAGNOSIS — M545 Low back pain: Secondary | ICD-10-CM

## 2015-12-29 DIAGNOSIS — B20 Human immunodeficiency virus [HIV] disease: Secondary | ICD-10-CM | POA: Diagnosis not present

## 2015-12-29 LAB — COMPREHENSIVE METABOLIC PANEL
ALBUMIN: 3.4 g/dL — AB (ref 3.6–5.1)
ALK PHOS: 81 U/L (ref 40–115)
ALT: 20 U/L (ref 9–46)
AST: 22 U/L (ref 10–35)
BUN: 30 mg/dL — AB (ref 7–25)
CHLORIDE: 104 mmol/L (ref 98–110)
CO2: 25 mmol/L (ref 20–31)
CREATININE: 1.96 mg/dL — AB (ref 0.70–1.25)
Calcium: 9.2 mg/dL (ref 8.6–10.3)
Glucose, Bld: 220 mg/dL — ABNORMAL HIGH (ref 65–99)
POTASSIUM: 4.8 mmol/L (ref 3.5–5.3)
Sodium: 135 mmol/L (ref 135–146)
TOTAL PROTEIN: 7 g/dL (ref 6.1–8.1)
Total Bilirubin: 0.4 mg/dL (ref 0.2–1.2)

## 2015-12-29 LAB — CBC
HCT: 38.4 % — ABNORMAL LOW (ref 39.0–52.0)
HEMOGLOBIN: 12.9 g/dL — AB (ref 13.0–17.0)
MCH: 32.5 pg (ref 26.0–34.0)
MCHC: 33.6 g/dL (ref 30.0–36.0)
MCV: 96.7 fL (ref 78.0–100.0)
MPV: 11.4 fL (ref 8.6–12.4)
Platelets: 176 10*3/uL (ref 150–400)
RBC: 3.97 MIL/uL — AB (ref 4.22–5.81)
RDW: 14.1 % (ref 11.5–15.5)
WBC: 5.7 10*3/uL (ref 4.0–10.5)

## 2015-12-29 LAB — LIPID PANEL
CHOL/HDL RATIO: 4.8 ratio (ref <=5.0)
CHOLESTEROL: 110 mg/dL — AB (ref 125–200)
HDL: 23 mg/dL — ABNORMAL LOW (ref >=40)
TRIGLYCERIDES: 564 mg/dL — AB (ref <150)

## 2015-12-30 LAB — RPR TITER

## 2015-12-30 LAB — HIV-1 RNA QUANT-NO REFLEX-BLD
HIV 1 RNA Quant: <20 copies/mL (ref <20)
HIV-1 RNA Quant, Log: <1.3 Log copies/mL (ref <1.30)

## 2015-12-30 LAB — RPR: RPR Ser Ql: REACTIVE — AB

## 2015-12-30 LAB — FLUORESCENT TREPONEMAL AB(FTA)-IGG-BLD: FLUORESCENT TREPONEMAL ABS: REACTIVE — AB

## 2015-12-30 LAB — T-HELPER CELL (CD4) - (RCID CLINIC ONLY)
CD4 T CELL ABS: 1050 /uL (ref 400–2700)
CD4 T CELL HELPER: 41 % (ref 33–55)

## 2016-01-05 ENCOUNTER — Telehealth: Payer: Self-pay | Admitting: *Deleted

## 2016-01-05 ENCOUNTER — Other Ambulatory Visit: Payer: Self-pay | Admitting: Internal Medicine

## 2016-01-05 DIAGNOSIS — E119 Type 2 diabetes mellitus without complications: Secondary | ICD-10-CM | POA: Diagnosis not present

## 2016-01-05 DIAGNOSIS — B20 Human immunodeficiency virus [HIV] disease: Secondary | ICD-10-CM

## 2016-01-05 NOTE — Telephone Encounter (Signed)
error 

## 2016-01-06 ENCOUNTER — Other Ambulatory Visit: Payer: Self-pay | Admitting: Pharmacist Clinician (PhC)/ Clinical Pharmacy Specialist

## 2016-01-06 MED ORDER — ATORVASTATIN CALCIUM 20 MG PO TABS: 20.0000 mg | ORAL_TABLET | Freq: Every day | ORAL | Status: DC

## 2016-01-06 NOTE — Progress Notes (Signed)
Pt called for refills of lipitor yesterday. He is on Genvoya for his HIV. The issue was the interaction with cobicistat and lipitor >20mg . Dr. [name] is ok to reduce the lipitor to 20mg  PO qday. Rx has been changed and sent to pharmacy.

## 2016-01-07 DIAGNOSIS — M4806 Spinal stenosis, lumbar region: Secondary | ICD-10-CM | POA: Diagnosis not present

## 2016-01-07 DIAGNOSIS — M5416 Radiculopathy, lumbar region: Secondary | ICD-10-CM | POA: Diagnosis not present

## 2016-01-12 ENCOUNTER — Ambulatory Visit (INDEPENDENT_AMBULATORY_CARE_PROVIDER_SITE_OTHER): Payer: Medicare Other | Admitting: Internal Medicine

## 2016-01-12 ENCOUNTER — Encounter: Payer: Self-pay | Admitting: Internal Medicine

## 2016-01-12 ENCOUNTER — Other Ambulatory Visit: Payer: Self-pay | Admitting: *Deleted

## 2016-01-12 VITALS — BP 155/84 | HR 72 | Temp 98.1°F | Wt 189.2 lb

## 2016-01-12 DIAGNOSIS — F17200 Nicotine dependence, unspecified, uncomplicated: Secondary | ICD-10-CM

## 2016-01-12 DIAGNOSIS — B20 Human immunodeficiency virus [HIV] disease: Secondary | ICD-10-CM | POA: Diagnosis not present

## 2016-01-12 MED ORDER — ELVITEG-COBIC-EMTRICIT-TENOFAF 150-150-200-10 MG PO TABS: ORAL_TABLET | ORAL | Status: DC

## 2016-01-12 NOTE — Assessment & Plan Note (Signed)
His HIV infection remains under excellent control. He will continue Genvoya and follow-up after lab work in 6 months.

## 2016-01-12 NOTE — Assessment & Plan Note (Signed)
I talked him again about his ongoing cigarette use and encouraged him to consider cutting down and setting a quit date, especially in light of the possibility that he will be having surgery in the near future.

## 2016-01-12 NOTE — Assessment & Plan Note (Signed)
His recent blood sugar here was 150 but he states that his blood sugars at home have been running higher than normal. I suspect that this is due to his relative inactivity caused by his recent back pain and radiculopathy.

## 2016-01-12 NOTE — Progress Notes (Signed)
Patient Active Problem List   Diagnosis Date Noted  . Human immunodeficiency virus (HIV) disease ([place]) 09/02/2006    Priority: High  . Acute renal failure superimposed on stage 3 chronic kidney disease ([place]) 11/03/2015  . Dehydration 11/03/2015  . Alcohol dependence ([place]) 11/03/2015  . Diabetes mellitus, type 2 ([place]) 06/17/2015  . Acute renal failure ([place]) 04/26/2015  . HTN (hypertension) 04/26/2015  . Syncope 04/26/2015  . DM (diabetes mellitus) ([place]) 04/26/2015  . Ischemic cardiomyopathy 05/12/2014  . Diarrhea 09/08/2013  . Shock ([place]) 09/06/2013  . Ulcer of lower extremity ([place]) 09/06/2013  . Community acquired pneumonia 09/06/2013  . Hyponatremia 09/06/2013  . Hyperkalemia 09/06/2013  . Acute kidney injury ([place]) 09/06/2013  . Hyperglycemia 09/06/2013  . Elevated transaminase level 09/06/2013  . Chest pain 09/06/2013  . Type II or unspecified type diabetes mellitus with unspecified complication, not stated as uncontrolled 10/22/2012  . Black stool 04/17/2012  . Bunion of left foot 11/25/2011  . Bunion, right foot 11/25/2011  . Polysubstance abuse 07/28/2011  . Hip fracture, left ([place]) 04/04/2011  . Insomnia 02/18/2011  . CAD (coronary artery disease) 04/20/2009  . Chronic combined systolic and diastolic congestive heart failure ([place]) 04/20/2009  . HLD (hyperlipidemia) 11/20/2006  . Gout 11/20/2006  . TOBACCO ABUSE 11/20/2006  . Essential hypertension 11/20/2006  . SYPHILIS 09/02/2006    Patient's Medications  New Prescriptions   No medications on file  Previous Medications   ALLOPURINOL (ZYLOPRIM) 100 MG TABLET    Take 1 tablet (100 mg total) by mouth daily.   ASPIRIN 81 MG TABLET    Take 81 mg by mouth daily.     ATORVASTATIN (LIPITOR) 20 MG TABLET    Take 1 tablet (20 mg total) by mouth daily.   B-D UF III MINI PEN NEEDLES 31G X 5 MM MISC    Use with insulin pen four times daily. Dx: E11.9   CARVEDILOL (COREG) 25 MG TABLET    Take 1 tablet (25 mg  total) by mouth 2 (two) times daily with a meal.   COLCHICINE 0.6 MG TABLET    Take 0.6 mg by mouth daily.   GLUCOSAMINE-CHONDROIT-VIT C-MN (GLUCOSAMINE CHONDR 1500 COMPLX PO)    Take 2 tablets by mouth daily.    HYDRALAZINE (APRESOLINE) 25 MG TABLET    Take 1 tablet (25 mg total) by mouth 3 (three) times daily.   INSULIN GLARGINE (LANTUS SOLOSTAR) 100 UNIT/ML SOLOSTAR PEN    Inject 45 Units into the skin daily. E11.22   INSULIN LISPRO (HUMALOG KWIKPEN) 100 UNIT/ML KIWKPEN    Inject 8 units subcutaneous at meals to control blood sugar   ISOSORBIDE MONONITRATE (IMDUR) 30 MG 24 HR TABLET    Take 30 mg by mouth daily.   LATANOPROST (XALATAN) 0.005 % OPHTHALMIC SOLUTION    Place 1 drop into both eyes at bedtime.    NAFTIFINE HCL 2 % CREA    Apply to affected area two times a day   NITROGLYCERIN (NITROSTAT) 0.3 MG SL TABLET    Place 1 tablet (0.3 mg total) under the tongue every 5 (five) minutes as needed.   TIZANIDINE (ZANAFLEX) 4 MG TABLET    take 1 tablet by mouth every 6 hours if needed for muscle spasm   TRAMADOL (ULTRAM) 50 MG TABLET    Take 50-100 mg by mouth every 6 (six) hours as needed for moderate pain or severe pain.   UMECLIDINIUM-VILANTEROL (ANORO ELLIPTA) 62.5-25 MCG/INH AEPB  Inhale 1 puff into the lungs daily.  Modified Medications   Modified Medication Previous Medication   ELVITEGRAVIR-COBICISTAT-EMTRICITABINE-TENOFOVIR (GENVOYA) 150-150-200-10 MG TABS TABLET GENVOYA 150-150-200-10 MG TABS tablet      TAKE 1 TABLET BY MOUTH DAILY WITH BREAKFAST.    TAKE 1 TABLET BY MOUTH DAILY WITH BREAKFAST.  Discontinued Medications   No medications on file    Subjective: Ray is in for his first HIV visit in one year. He has developed some low back pain with radiation down his left leg. This is not improved with several epidural steroid injections. He has been seeing Dr. Laurence Spates and is going to be referred to another orthopedic surgeon for consideration of back surgery. He has not been  able to be as active because of the pain and he's noted that his blood sugars have been running higher than normal even though his insulin was increased. He has not had any problems obtaining or tolerating his Genvoya. He takes it each morning with breakfast. He does not recall missing any doses.   Review of Systems: Review of Systems  Constitutional: Negative for fever, chills, weight loss, malaise/fatigue and diaphoresis.  HENT: Negative for sore throat.   Respiratory: Negative for cough, sputum production and shortness of breath.   Cardiovascular: Negative for chest pain.  Gastrointestinal: Negative for nausea, vomiting and diarrhea.  Genitourinary: Negative for dysuria.  Musculoskeletal: Positive for back pain. Negative for myalgias and joint pain.  Skin: Negative for rash.  Neurological: Negative for dizziness, focal weakness and headaches.  Psychiatric/Behavioral: Negative for depression and substance abuse. The patient is not nervous/anxious.     Past Medical History  Diagnosis Date  . Syphilis, unspecified   . Coronary atherosclerosis of native coronary artery     s/p surgery  . Type II or unspecified type diabetes mellitus without mention of complication, not stated as uncontrolled 2004    Diagnosed in 2004. Came to our clinic in 5/07 and was on Glucotrol XL 10 mg BID and metformin 500 BID. Max A1C on those two meds was 6.9 on 03/10/08. He has been able to be removed from the meds while maintaining a good A1C. He has DM neuropathy (although neuropathy could be multifactorial.)  . Human immunodeficiency virus (HIV) disease ([place])   . Allergic rhinitis, cause unspecified   . Gout, unspecified   . Hyperlipidemia   . HTN (hypertension)   . Ischemic cardiomyopathy   . Drug abuse     hx; tested for cocaine as recently as 2/08. says he is not using drugs now - avoided defib. for this reason   . Acute maxillary sinusitis   . Backache, unspecified   . Coronary atherosclerosis of artery  bypass graft   . Closed fracture of intertrochanteric section of femur ([place])   . Chronic kidney disease, unspecified ([place])   . Tobacco use disorder   . History of hepatitis C   . GERD (gastroesophageal reflux disease)   . Arthritis   . Hx of cardiovascular stress test     Lexiscan Myoview 3/16:  Lg ant-lateral and inf scar, no ischemia, EF 24%; high risk  . Hypernatriuria     Social History  Substance Use Topics  . Smoking status: Current Every Day Smoker -- 0.50 packs/day for 36 years    Types: Cigarettes  . Smokeless tobacco: Never Used  . Alcohol Use: 3.6 oz/week    6 Standard drinks or equivalent per week     Comment: 2 - 24 oz beers /  week    Family History  Problem Relation Age of Onset  . Heart failure Father   . Hypertension Father   . Diabetes Brother   . Heart attack Brother   . Colon cancer Neg Hx   . Alzheimer's disease Mother   . Stroke Sister   . Diabetes Sister   . Hypertension Brother   . Diabetes Brother   . Drug abuse Brother   . Alzheimer's disease Sister     Allergies  Allergen Reactions  . Amphetamines Other (See Comments)    unknown    Objective:  Filed Vitals:   01/12/16 0941  BP: 155/84  Pulse: 72  Temp: 98.1 F (36.7 C)  TempSrc: Oral  Weight: 189 lb 4 oz (85.843 kg)   Body mass index is 24.29 kg/(m^2).  Physical Exam  Constitutional: He is oriented to person, place, and time.  He is pleasant and in no distress.  HENT:  Mouth/Throat: No oropharyngeal exudate.  Eyes: Conjunctivae are normal.  Cardiovascular: Normal rate and regular rhythm.   No murmur heard. Pulmonary/Chest: Breath sounds normal.  Abdominal: Soft. He exhibits no mass. There is no tenderness.  Musculoskeletal: Normal range of motion.  Neurological: He is alert and oriented to person, place, and time. Gait normal.  Skin: No rash noted.  Psychiatric: Mood and affect normal.    Lab Results Lab Results  Component Value Date   WBC 5.7 12/29/2015   HGB  12.9* 12/29/2015   HCT 38.4* 12/29/2015   MCV 96.7 12/29/2015   PLT 176 12/29/2015    Lab Results  Component Value Date   CREATININE 1.96* 12/29/2015   BUN 30* 12/29/2015   NA 135 12/29/2015   K 4.8 12/29/2015   CL 104 12/29/2015   CO2 25 12/29/2015    Lab Results  Component Value Date   ALT 20 12/29/2015   AST 22 12/29/2015   ALKPHOS 81 12/29/2015   BILITOT 0.4 12/29/2015    Lab Results  Component Value Date   CHOL 110* 12/29/2015   HDL 23* 12/29/2015   LDLCALC NOT CALC 12/29/2015   TRIG 564* 12/29/2015   CHOLHDL 4.8 12/29/2015    Lab Results HIV 1 RNA QUANT (copies/mL)  Date Value  12/29/2015 <20  06/30/2015 <20  11/25/2014 <20   CD4 T CELL ABS (/uL)  Date Value  12/29/2015 1050  06/30/2015 900  11/25/2014 870      Problem List Items Addressed This Visit      High   Human immunodeficiency virus (HIV) disease ([place])    His HIV infection remains under excellent control. He will continue Genvoya and follow-up after lab work in 6 months.      Relevant Medications   elvitegravir-cobicistat-emtricitabine-tenofovir (GENVOYA) 150-150-200-10 MG TABS tablet   Other Relevant Orders   T-helper cell (CD4)- ([facility] clinic only)   HIV 1 RNA quant-no reflex-bld   CBC   Comprehensive metabolic panel     Unprioritized   TOBACCO ABUSE    I talked him again about his ongoing cigarette use and encouraged him to consider cutting down and setting a quit date, especially in light of the possibility that he will be having surgery in the near future.       Other Visit Diagnoses    HIV disease ([place])    -  Primary    Relevant Medications    elvitegravir-cobicistat-emtricitabine-tenofovir (GENVOYA) 150-150-200-10 MG TABS tablet         [name], MD [facility] Infectious Disease  [facility] [phone] pager   [phone] cell 01/12/2016, 10:06 AM

## 2016-01-15 ENCOUNTER — Telehealth: Payer: Self-pay | Admitting: *Deleted

## 2016-01-15 NOTE — Telephone Encounter (Signed)
Patient notified and agreed.  

## 2016-01-15 NOTE — Telephone Encounter (Signed)
Let's increase his lantus to 50 units ongoing for now. If the sugars are staying up after that change, he should call back to be seen.

## 2016-01-15 NOTE — Telephone Encounter (Signed)
Patient called and stated that his blood sugars have been up and wants to know if it could be coming from his back pain. They have him on 300mg  Gabapentin, Tramadol 50mg , Hydrocodone 5/325, Zanaflex 4mg . Patient wants to know if his blood sugars are staying high due to these medications. Patient states he can't get his blood sugar under 170. He is not having any problems with this but just curious. The HIV Dr. [name] stated that it is coming from the stress of the pain. He is taking his Insulins Humalog 12units and Solostar Lantus 45 units but he took 50 units and got it to 170. Highest blood reading 285. Please Advise. (dr. [name] patient) (can leave message on machine if not home)

## 2016-01-17 ENCOUNTER — Other Ambulatory Visit: Payer: Self-pay | Admitting: Internal Medicine

## 2016-01-20 ENCOUNTER — Telehealth: Payer: Self-pay

## 2016-01-20 DIAGNOSIS — E0869 Diabetes mellitus due to underlying condition with other specified complication: Principal | ICD-10-CM

## 2016-01-20 DIAGNOSIS — IMO0002 Reserved for concepts with insufficient information to code with codable children: Secondary | ICD-10-CM

## 2016-01-20 DIAGNOSIS — E0865 Diabetes mellitus due to underlying condition with hyperglycemia: Secondary | ICD-10-CM

## 2016-01-20 MED ORDER — INSULIN GLARGINE 100 UNIT/ML SOLOSTAR PEN: 55.0000 [IU] | PEN_INJECTOR | Freq: Every day | SUBCUTANEOUS | Status: DC

## 2016-01-20 NOTE — Telephone Encounter (Signed)
Message left on triage voicemail: Patient increased lantus to 50 units and blood sugar is still running in the 200's. Patient would like to know what to do. Patient will also need a new rx sent to the pharmacy for he will run out of insulin sooner since he has increased the units. Blood sugar readings: Today- 205, Yesterday 216 (pm), Yesterday 220 (am).   Last office visit 12/11/15, pending OV 04/14/16  Spoke with patient, patient states that he was told by Dr.Campbell that bloodsugar will continue to run high due to pain in back. Patient will need surgery per Dr.Yates. Patient is taking gabapentin, zanaflex and tramadol for pain. Patient will see surgeon on 02-02-16.    Please advise on BS and diabetic medication

## 2016-01-20 NOTE — Telephone Encounter (Signed)
Increase lantus 55 units daily

## 2016-01-20 NOTE — Telephone Encounter (Signed)
Called patient, left message on voicemail informing patient of unit increase. Medication list updated. New RX sent to the pharmacy

## 2016-01-27 ENCOUNTER — Other Ambulatory Visit: Payer: Self-pay | Admitting: *Deleted

## 2016-01-27 DIAGNOSIS — E1122 Type 2 diabetes mellitus with diabetic chronic kidney disease: Secondary | ICD-10-CM

## 2016-01-27 MED ORDER — INSULIN LISPRO 100 UNIT/ML (KWIKPEN): PEN_INJECTOR | SUBCUTANEOUS | Status: DC

## 2016-01-27 NOTE — Telephone Encounter (Signed)
Patient requested refill to be faxed to pharmacy.  

## 2016-02-02 ENCOUNTER — Telehealth: Payer: Self-pay | Admitting: *Deleted

## 2016-02-02 DIAGNOSIS — M5416 Radiculopathy, lumbar region: Secondary | ICD-10-CM | POA: Diagnosis not present

## 2016-02-02 DIAGNOSIS — M4806 Spinal stenosis, lumbar region: Secondary | ICD-10-CM | POA: Diagnosis not present

## 2016-02-02 NOTE — Telephone Encounter (Signed)
Received a call from Jacob [name] this morning regarding his insulin and pen needles, he stated that he has been taking 55 units of insulin(Lantus) twice daily instead of once daily. According to your notes he is suppose to be taking the 55 units only once daily with 8 units of Hiumalog. Please Advise! Because he wants this fixed today and he is presently at the orthopedic doctor now regarding the pain that is causing his increase in his numbers.

## 2016-02-02 NOTE — Telephone Encounter (Signed)
He should only be using lantus DAILY as ordered and cont humalog qAC

## 2016-02-09 ENCOUNTER — Telehealth: Payer: Self-pay

## 2016-02-09 NOTE — Telephone Encounter (Signed)
Patient called to ask if we could call his  Orthopedics doctors  office Dr. Lorin Mercy and find out what he needs for clearance for back surgery. Called spoke with patient about this and told him that he would need to bring in the form from his Ortho doctors and the doctor could fill it out at his next appointment.

## 2016-02-10 ENCOUNTER — Other Ambulatory Visit: Payer: Medicare Other

## 2016-02-11 ENCOUNTER — Telehealth: Payer: Self-pay | Admitting: *Deleted

## 2016-02-11 ENCOUNTER — Other Ambulatory Visit: Payer: Self-pay | Admitting: *Deleted

## 2016-02-11 MED ORDER — GLUCOSE BLOOD VI STRP: ORAL_STRIP | Status: DC

## 2016-02-11 MED ORDER — ONETOUCH LANCETS MISC: Status: DC

## 2016-02-11 NOTE — Telephone Encounter (Signed)
Patient called and stated that [facility]-Dr. Lorin Mercy was suppose to fax surgery clearance form. I checked Dr. Saralyn Pilar folder and have no record of the form. I called [facility] [phone] and [facility] for Jacob [name] to refax this paperwork. I called patient and offered an appointment for tomorrow for surgery clearance and he refused and stated that he had another appointment to go to, he just wants the form filled out and sent to Dr. Lorin Mercy and stated that we were the one to refer him. Explained to him that there was a possibility that he would need an appointment for this.

## 2016-02-11 NOTE — Telephone Encounter (Signed)
Patient called back and stated that Judson Roch at [facility] already has the form to disregard previous message.

## 2016-02-11 NOTE — Telephone Encounter (Signed)
For is already done. He does not need sx clearance appt. Need to check with staff at the office to find out what happened to it. It was signed on Tuesday.

## 2016-02-15 ENCOUNTER — Ambulatory Visit: Payer: Medicare Other | Admitting: Pharmacotherapy

## 2016-02-23 ENCOUNTER — Ambulatory Visit (INDEPENDENT_AMBULATORY_CARE_PROVIDER_SITE_OTHER): Payer: Medicare Other | Admitting: Podiatry

## 2016-02-23 ENCOUNTER — Encounter: Payer: Self-pay | Admitting: Podiatry

## 2016-02-23 ENCOUNTER — Other Ambulatory Visit: Payer: Self-pay

## 2016-02-23 ENCOUNTER — Other Ambulatory Visit: Payer: Self-pay | Admitting: Orthopaedic Surgery

## 2016-02-23 DIAGNOSIS — M79676 Pain in unspecified toe(s): Secondary | ICD-10-CM

## 2016-02-23 DIAGNOSIS — Q828 Other specified congenital malformations of skin: Secondary | ICD-10-CM | POA: Diagnosis not present

## 2016-02-23 DIAGNOSIS — B351 Tinea unguium: Secondary | ICD-10-CM | POA: Diagnosis not present

## 2016-02-23 DIAGNOSIS — E119 Type 2 diabetes mellitus without complications: Secondary | ICD-10-CM | POA: Diagnosis not present

## 2016-02-23 MED ORDER — NITROGLYCERIN 0.3 MG SL SUBL: 0.3000 mg | SUBLINGUAL_TABLET | SUBLINGUAL | Status: DC | PRN

## 2016-02-23 NOTE — Progress Notes (Signed)
Patient ID: IRIC BEYMER, male   DOB: 1951/02/17, 65 y.o.   MRN: [id number] Complaint:  Visit Type: Patient returns to my office for continued preventative foot care services. Complaint: Patient states" my nails have grown long and thick and become painful to walk and wear shoes" Patient has been diagnosed with DM with no foot complications. The patient presents for preventative foot care services. No changes to ROS  Podiatric Exam: Vascular: dorsalis pedis and posterior tibial pulses are palpable bilateral. Capillary return is immediate. Temperature gradient is WNL. Skin turgor WNL  Sensorium: Diminished  Semmes Weinstein monofilament test. Normal tactile sensation bilaterally. Nail Exam: Pt has thick disfigured discolored nails with subungual debris noted bilateral entire nail hallux through fifth toenails Ulcer Exam: There is no evidence of ulcer or pre-ulcerative changes or infection. Orthopedic Exam: Muscle tone and strength are WNL. No limitations in general ROM. No crepitus or effusions noted. Foot type and digits show no abnormalities. Severe HAV  B/L. Skin: No Porokeratosis. No infection or ulcers. Callus sub 5th B/L Callus.  Pinch callus B/L  Diagnosis:  Onychomycosis, , Pain in right toe, pain in left toes,Porokeratosos B/L  Treatment & Plan Procedures and Treatment: Consent by patient was obtained for treatment procedures. The patient understood the discussion of treatment and procedures well. All questions were answered thoroughly reviewed. Debridement of mycotic and hypertrophic toenails, 1 through 5 bilateral and clearing of subungual debris. No ulceration, no infection noted. D.ebride callus B/L Return Visit-Office Procedure: Patient instructed to return to the office for a follow up visit 3 months for continued evaluation and treatment.    Gardiner Barefoot DPM

## 2016-02-23 NOTE — Telephone Encounter (Signed)
[name], MD at 11/16/2015 5:04 PM  nitroGLYCERIN (NITROSTAT) 0.3 MG SL tabletPlace 1 tablet (0.3 mg total) under the tongue every 5 (five) minutes as needed Patient Instructions     Medication Instructions:  Your physician recommends that you continue on your current medications as directed. Please refer to the Current Medication list given to you today.

## 2016-02-23 NOTE — Telephone Encounter (Signed)
Called patient to make sure he understood the NTG instructions. Take 1 NTG, under your tongue, while sitting. If no relief of pain may repeat NTG, one tab every 5 minutes up to 3 tablets total over 15 minutes. If no relief CALL 911. If you have dizziness/lightheadness while taking NTG, stop taking and call 911.Patient verbalized understanding. Patient just wanted to make sure he had nitroglycerin on hand in case he has any chest pain, but has not had any chest pain. Patient stated the tablets he has are old.

## 2016-02-24 ENCOUNTER — Other Ambulatory Visit: Payer: Self-pay | Admitting: *Deleted

## 2016-02-24 MED ORDER — NITROGLYCERIN 0.3 MG SL SUBL: 0.3000 mg | SUBLINGUAL_TABLET | SUBLINGUAL | Status: DC | PRN

## 2016-02-24 NOTE — Telephone Encounter (Signed)
Patient called and stated that the rx for nitro was [facility] not [facility] yesterday. I will re-send.

## 2016-02-24 NOTE — Pre-Procedure Instructions (Signed)
Jacob [name]  02/24/2016      [facility], [place] [address] Phone: [phone] Fax: [place] [address] [place] [address] [place] [address] Phone: [phone] Fax: [phone]    Your procedure is scheduled on Mon, May 8 @ 2:30 PM  Report to [facility] at 12:30 PM  Call this number if you have problems the morning of surgery:  [phone]   Remember:  Do not eat food or drink liquids after midnight.  Take these medicines the morning of surgery with A SIP OF WATER <Bring  Your Inhaler With You>,Allopurinol(Zyloprim),Carvedilol(Coreg),Colchicine(if needed),Gabapentin(Neurontin),Hydralazine(Apresoline),Imdur(Isosorbide),and Tramadol(Ultram-if needed)               Stop taking your Aspirin along with any Vitamins or Herbal Medications. No Goody's,BC's,Aleve,Advil,Motrin,Ibuprofen,or Fish Oil.     How to Manage Your Diabetes Before and After Surgery  Why is it important to control my blood sugar before and after surgery? . Improving blood sugar levels before and after surgery helps healing and can limit problems. . A way of improving blood sugar control is eating a healthy diet by: o  Eating less sugar and carbohydrates o  Increasing activity/exercise o  Talking with your doctor about reaching your blood sugar goals . High blood sugars (greater than 180 mg/dL) can raise your risk of infections and slow your recovery, so you will need to focus on controlling your diabetes during the weeks before surgery. . Make sure that the doctor who takes care of your diabetes knows about your planned surgery including the date and location.  How do I manage my blood sugar before surgery? . Check your blood sugar at least 4 times a day, starting 2 days before surgery, to make sure that the level is  not too high or low. o Check your blood sugar the morning of your surgery when you wake up and every 2 hours until you get to the Short Stay unit. . If your blood sugar is less than 70 mg/dL, you will need to treat for low blood sugar: o Do not take insulin. o Treat a low blood sugar (less than 70 mg/dL) with  cup of clear juice (cranberry or apple), 4 glucose tablets, OR glucose gel. o Recheck blood sugar in 15 minutes after treatment (to make sure it is greater than 70 mg/dL). If your blood sugar is not greater than 70 mg/dL on recheck, call [phone] for further instructions. . Report your blood sugar to the short stay nurse when you get to Short Stay.  . If you are admitted to the hospital after surgery: o Your blood sugar will be checked by the staff and you will probably be given insulin after surgery (instead of oral diabetes medicines) to make sure you have good blood sugar levels. o The goal for blood sugar control after surgery is 80-180 mg/dL.              WHAT DO I DO ABOUT MY DIABETES MEDICATION?   [name] Do not take oral diabetes medicines (pills) the morning of surgery.  .       . HE MORNING OF SURGERY, take ______27_______ units of ___Lantus_______insulin.  . The day of surgery, do not take other diabetes injectables, including Byetta (exenatide), Bydureon (exenatide ER), Victoza (liraglutide), or Trulicity (dulaglutide).  . If your CBG  is greater than 220 mg/dL, you may take  of your sliding scale (correction) dose of insulin.  Other Instructions:          Patient Signature:  Date:   Nurse Signature:  Date:   Reviewed and Endorsed by [facility] Patient Education Committee, August 2015   Do not wear jewelry.  Do not wear lotions, powders, or colognes.    Men may shave face and neck.  Do not bring valuables to the hospital.  [facility] is not responsible for any belongings or valuables.  Contacts, dentures or bridgework may not be worn into  surgery.  Leave your suitcase in the car.  After surgery it may be brought to your room.  For patients admitted to the hospital, discharge time will be determined by your treatment team.  Patients discharged the day of surgery will not be allowed to drive home.    Special instructions:  [facility] - Preparing for Surgery  Before surgery, you can play an important role.  Because skin is not sterile, your skin needs to be as free of germs as possible.  You can reduce the number of germs on you skin by washing with CHG (chlorahexidine gluconate) soap before surgery.  CHG is an antiseptic cleaner which kills germs and bonds with the skin to continue killing germs even after washing.  Please DO NOT use if you have an allergy to CHG or antibacterial soaps.  If your skin becomes reddened/irritated stop using the CHG and inform your nurse when you arrive at Short Stay.  Do not shave (including legs and underarms) for at least 48 hours prior to the first CHG shower.  You may shave your face.  Please follow these instructions carefully:   1.  Shower with CHG Soap the night before surgery and the                                morning of Surgery.  2.  If you choose to wash your hair, wash your hair first as usual with your       normal shampoo.  3.  After you shampoo, rinse your hair and body thoroughly to remove the                      Shampoo.  4.  Use CHG as you would any other liquid soap.  You can apply chg directly       to the skin and wash gently with scrungie or a clean washcloth.  5.  Apply the CHG Soap to your body ONLY FROM THE NECK DOWN.        Do not use on open wounds or open sores.  Avoid contact with your eyes,       ears, mouth and genitals (private parts).  Wash genitals (private parts)       with your normal soap.  6.  Wash thoroughly, paying special attention to the area where your surgery        will be performed.  7.  Thoroughly rinse your body with warm water from the neck  down.  8.  DO NOT shower/wash with your normal soap after using and rinsing off       the CHG Soap.  9.  Pat yourself dry with a clean towel.            10.  Wear clean pajamas.  11.  Place clean sheets on your bed the night of your first shower and do not        sleep with pets.  Day of Surgery  Do not apply any lotions/deoderants the morning of surgery.  Please wear clean clothes to the hospital/surgery center.    Please read over the following fact sheets that you were given. Pain Booklet, Coughing and Deep Breathing, MRSA Information and Surgical Site Infection Prevention

## 2016-02-25 ENCOUNTER — Encounter (HOSPITAL_COMMUNITY)
Admission: RE | Admit: 2016-02-25 | Discharge: 2016-02-25 | Disposition: A | Discharge status: Home / Self Care | Payer: Medicare Other | Source: Ambulatory Visit | Attending: Orthopaedic Surgery | Admitting: Orthopaedic Surgery

## 2016-02-25 ENCOUNTER — Encounter (HOSPITAL_COMMUNITY): Payer: Self-pay

## 2016-02-25 DIAGNOSIS — M4807 Spinal stenosis, lumbosacral region: Secondary | ICD-10-CM | POA: Diagnosis not present

## 2016-02-25 DIAGNOSIS — I2510 Atherosclerotic heart disease of native coronary artery without angina pectoris: Secondary | ICD-10-CM | POA: Diagnosis not present

## 2016-02-25 DIAGNOSIS — F17200 Nicotine dependence, unspecified, uncomplicated: Secondary | ICD-10-CM | POA: Diagnosis not present

## 2016-02-25 DIAGNOSIS — Z01812 Encounter for preprocedural laboratory examination: Secondary | ICD-10-CM | POA: Diagnosis not present

## 2016-02-25 DIAGNOSIS — E1122 Type 2 diabetes mellitus with diabetic chronic kidney disease: Secondary | ICD-10-CM | POA: Insufficient documentation

## 2016-02-25 DIAGNOSIS — Z794 Long term (current) use of insulin: Secondary | ICD-10-CM | POA: Insufficient documentation

## 2016-02-25 DIAGNOSIS — I509 Heart failure, unspecified: Secondary | ICD-10-CM | POA: Diagnosis not present

## 2016-02-25 DIAGNOSIS — K219 Gastro-esophageal reflux disease without esophagitis: Secondary | ICD-10-CM | POA: Insufficient documentation

## 2016-02-25 DIAGNOSIS — Z7982 Long term (current) use of aspirin: Secondary | ICD-10-CM | POA: Insufficient documentation

## 2016-02-25 DIAGNOSIS — N189 Chronic kidney disease, unspecified: Secondary | ICD-10-CM | POA: Insufficient documentation

## 2016-02-25 DIAGNOSIS — Z951 Presence of aortocoronary bypass graft: Secondary | ICD-10-CM | POA: Insufficient documentation

## 2016-02-25 DIAGNOSIS — I129 Hypertensive chronic kidney disease with stage 1 through stage 4 chronic kidney disease, or unspecified chronic kidney disease: Secondary | ICD-10-CM | POA: Insufficient documentation

## 2016-02-25 DIAGNOSIS — Z01818 Encounter for other preprocedural examination: Secondary | ICD-10-CM | POA: Insufficient documentation

## 2016-02-25 DIAGNOSIS — Z79899 Other long term (current) drug therapy: Secondary | ICD-10-CM | POA: Diagnosis not present

## 2016-02-25 DIAGNOSIS — B20 Human immunodeficiency virus [HIV] disease: Secondary | ICD-10-CM | POA: Insufficient documentation

## 2016-02-25 HISTORY — DX: Nocturia: R35.1

## 2016-02-25 HISTORY — DX: Personal history of colonic polyps: Z86.010

## 2016-02-25 HISTORY — DX: Other muscle spasm: M62.838

## 2016-02-25 HISTORY — DX: Heart failure, unspecified: I50.9

## 2016-02-25 HISTORY — DX: Personal history of colon polyps, unspecified: Z86.0100

## 2016-02-25 HISTORY — DX: Reserved for inherently not codable concepts without codable children: IMO0001

## 2016-02-25 HISTORY — DX: Unspecified glaucoma: H40.9

## 2016-02-25 HISTORY — DX: Pneumonia, unspecified organism: J18.9

## 2016-02-25 HISTORY — DX: Polyneuropathy, unspecified: G62.9

## 2016-02-25 LAB — COMPREHENSIVE METABOLIC PANEL
ALT: 25 U/L (ref 17–63)
AST: 28 U/L (ref 15–41)
Albumin: 3.2 g/dL — ABNORMAL LOW (ref 3.5–5.0)
Alkaline Phosphatase: 74 U/L (ref 38–126)
Anion gap: 9 (ref 5–15)
BUN: 32 mg/dL — ABNORMAL HIGH (ref 6–20)
CHLORIDE: 111 mmol/L (ref 101–111)
CO2: 19 mmol/L — ABNORMAL LOW (ref 22–32)
CREATININE: 1.96 mg/dL — AB (ref 0.61–1.24)
Calcium: 8.8 mg/dL — ABNORMAL LOW (ref 8.9–10.3)
GFR, EST AFRICAN AMERICAN: 39 mL/min — AB (ref >60)
GFR, EST NON AFRICAN AMERICAN: 34 mL/min — AB (ref >60)
Glucose, Bld: 208 mg/dL — ABNORMAL HIGH (ref 65–99)
POTASSIUM: 4.9 mmol/L (ref 3.5–5.1)
Sodium: 139 mmol/L (ref 135–145)
TOTAL PROTEIN: 7.2 g/dL (ref 6.5–8.1)
Total Bilirubin: 0.3 mg/dL (ref 0.3–1.2)

## 2016-02-25 LAB — CBC
HCT: 41.4 % (ref 39.0–52.0)
Hemoglobin: 14 g/dL (ref 13.0–17.0)
MCH: 32 pg (ref 26.0–34.0)
MCHC: 33.8 g/dL (ref 30.0–36.0)
MCV: 94.5 fL (ref 78.0–100.0)
PLATELETS: 152 10*3/uL (ref 150–400)
RBC: 4.38 MIL/uL (ref 4.22–5.81)
RDW: 13 % (ref 11.5–15.5)
WBC: 5.5 10*3/uL (ref 4.0–10.5)

## 2016-02-25 LAB — PROTIME-INR
INR: 1.04 (ref 0.00–1.49)
PROTHROMBIN TIME: 13.8 s (ref 11.6–15.2)

## 2016-02-25 LAB — SURGICAL PCR SCREEN
MRSA, PCR: NEGATIVE
Staphylococcus aureus: NEGATIVE

## 2016-02-25 LAB — GLUCOSE, CAPILLARY: GLUCOSE-CAPILLARY: 201 mg/dL — AB (ref 65–99)

## 2016-02-25 MED ORDER — CHLORHEXIDINE GLUCONATE 4 % EX LIQD: 60.0000 mL | Freq: Once | CUTANEOUS | Status: DC

## 2016-02-25 NOTE — Progress Notes (Signed)
Pt called with continued questions regarding how to do his insulin DOS. We again reviewed that he is to start checking his blood sugar at least 4 times a day ,starting 2 days before, to make sure that his sugar is not too high or too low. I also instructed him that when he gets up on the morning of surgery he needs to check his blood sugar every 2 hours until he arrives to Short Stay. If his sugar is 70 or less he was told to drink a 1/2 cup of either cranberry or apple juice or 4 glucose tabs,or the glucose gel. He is to recheck his blood sugar 15 mins after treatment.If sugar is still 70 or below he is to call Short Stay for further instructions. He was instructed to take Lantus 27 units morning of surgery. Also if sugar is > 220 then to take Humalog 4 units. We went over this above info 3 x to which he finally agreed he understood instructions given.

## 2016-02-25 NOTE — Progress Notes (Addendum)
Anesthesia Chart Review:  Pt is a 65 year old male scheduled for L4-5 lateral recess decompression, removal extradural intraspinal facet cyst, possible L5-S1 lateral recess decompression on 02/29/2016 with Dr. Lorin [name].   Cardiologist is Dr. [name]. PCP is Dr. Gildardo [name] who has cleared pt for surgery.   PMH includes:  CAD (s/p CABG 2004), CHF, ischemic cardiomyopathy, HTN, DM, hepatitis C, hyperlipidemia, CKD, HIV, GERD. Current smoker. BMI 24.5  Medications include: ASA, lipitor, carvedilol, genvoya, hydralazine, lantus, humalog, imdur, anoro ellipta.   Preoperative labs reviewed.   - HgbA1c pending, glucose 208.  - Cr 1.96, BUN 32. Pt's baseline appears to be around 1.5. I routed these results to Dr. [name] for review and she responded "His baseline Cr ranges 1.38-2.26. He will be ok to proceed with surgery."  Chest x-ray 11/03/15 reviewed. No active cardiopulmonary disease.   EKG 11/03/15: Sinus rhythm. Inferolateral infarct, old  Nuclear stress test 01/19/15: High risk stress nuclear study with large, severe, fixed anterolateral and inferior defects consistent with prior infarct; no ischemia; study high risk due to reduced LV function. LV Ejection Fraction: 24%. LV Wall Motion: Global hypokinesis and LVE.  Echo 09/08/13:  - Left ventricle: The cavity size was moderately dilated. Wall thickness was increased in a pattern of mild LVH.Systolic function was moderately reduced. The estimated ejection fraction was in the range of 35% to 40%. There is severe hypokinesis of the entireinferior myocardium. Doppler parameters are consistent with abnormal left ventricular relaxation (grade 1 diastolic dysfunction). - Left atrium: The atrium was mildly dilated. - Right atrium: The atrium was mildly dilated.  Cardiac cath 12/19/04:  1. LM was large caliber vessel; no evidence of flow limiting disease. 2. LAD showed no evidence of significant flow limiting lesions. The first diagonal branch was  occluded but received collaterals from the ramus branch. 3. Cx was small with a proximal 70% stenosis.The ramus branch was occluded. 4. RCA was occluded at the ostium. GRAFT ANATOMY: SVG to the ramus which was a large vessel was patent. There were collaterals from the ramus to the first diagonal branch. SVG to the RCA was patent. There was a distal 30% stenosis within the graft.  Pt saw Dr. Virl [name] with EP cardiology 03/07/14 and was offered ICD but pt declined.   Reviewed case with Dr. Linna [name]. Pt will need cardiac clearance prior to surgery. Left voicemail for [facility] in Dr. Lorin [name]' office.   Jacob Cass, FNP-BC [facility]/Anesthesiology Phone: [phone] 02/25/2016 4:56 PM  Addendum:   Dr. [name] has cleared pt for surgery.   If no changes, I anticipate pt can proceed with surgery as scheduled.   Jacob Cass, FNP-BC [facility]/Anesthesiology Phone: [phone] 02/26/2016 12:41 PM

## 2016-02-25 NOTE — Progress Notes (Signed)
Patient called and informed Nurse that he did not understand DOS instructions regarding insulin. Nurse reviewed the DOS instruction sheet with patient several times and instructed patient to check his blood glucose before taking any insulin, and if his blood glucose was greater than 220, then he should take 27 units of Lantus and 4 units of Novolog. However if his blood sugar was not greater than 220, then he should only take 27 units of Lantus DOS. Patient then informed Nurse that he does not check his blood sugar before taking insulin because he was never told to. Nurse instructed patient that he needed to check his blood glucose before administering any insulin. Patient verbalized understanding.

## 2016-02-25 NOTE — Progress Notes (Addendum)
Cardiologist is [place] visit in epic from 10-2015  Medical Md is Dr.Monica [name]  Multiple echo reports in epic   Stress test reports in epic from 2015/2016  EKG and CXR in epic from 11-03-15  Heart cath around 2005

## 2016-02-26 ENCOUNTER — Telehealth: Payer: Self-pay | Admitting: Cardiovascular Disease

## 2016-02-26 ENCOUNTER — Telehealth: Payer: Self-pay | Admitting: *Deleted

## 2016-02-26 LAB — HEMOGLOBIN A1C
Hgb A1c MFr Bld: 8.6 % — ABNORMAL HIGH (ref 4.8–5.6)
MEAN PLASMA GLUCOSE: 200 mg/dL

## 2016-02-26 NOTE — Telephone Encounter (Signed)
Patient stated that he is having surgery on his back at 3:30 on Monday. He just wanted to make you aware.

## 2016-02-26 NOTE — Telephone Encounter (Signed)
Per Dr. [name] okay for back surgery. Will fax phone note.

## 2016-02-26 NOTE — Telephone Encounter (Signed)
Patient called and left message on voicemail and stated that he had some Concerns and wanted to make you aware of Surgery on Monday.  I called patient back and LMOM to return call.

## 2016-02-26 NOTE — Telephone Encounter (Signed)
New message      Request for surgical clearance:  What type of surgery is being performed? Lumbar spine surgery 1. When is this surgery scheduled? 02-29-16  Are there any medications that need to be held prior to surgery and how long? Need cardiac clearance 2. Name of physician performing surgery? Dr Lorin Mercy  3. What is your office phone and fax number?  Put in epic

## 2016-02-29 ENCOUNTER — Encounter (HOSPITAL_COMMUNITY)
Admission: AD | Disposition: A | Discharge status: Skilled Nursing Facility | Payer: Self-pay | Source: Ambulatory Visit | Attending: Orthopaedic Surgery

## 2016-02-29 ENCOUNTER — Ambulatory Visit (HOSPITAL_COMMUNITY): Payer: Medicare Other | Admitting: Emergency Medicine

## 2016-02-29 ENCOUNTER — Inpatient Hospital Stay (HOSPITAL_COMMUNITY)
Admission: AD | Admit: 2016-02-29 | Discharge: 2016-03-01 | DRG: 518 | Disposition: A | Discharge status: Skilled Nursing Facility | Payer: Medicare Other | Source: Ambulatory Visit | Attending: Orthopaedic Surgery | Admitting: Orthopaedic Surgery

## 2016-02-29 ENCOUNTER — Ambulatory Visit (HOSPITAL_COMMUNITY): Payer: Medicare Other | Admitting: Anesthesiology

## 2016-02-29 ENCOUNTER — Encounter (HOSPITAL_COMMUNITY): Payer: Self-pay | Admitting: *Deleted

## 2016-02-29 ENCOUNTER — Ambulatory Visit (HOSPITAL_COMMUNITY): Payer: Medicare Other

## 2016-02-29 DIAGNOSIS — Z419 Encounter for procedure for purposes other than remedying health state, unspecified: Secondary | ICD-10-CM

## 2016-02-29 DIAGNOSIS — I509 Heart failure, unspecified: Secondary | ICD-10-CM | POA: Diagnosis not present

## 2016-02-29 DIAGNOSIS — G9611 Dural tear: Secondary | ICD-10-CM | POA: Diagnosis not present

## 2016-02-29 DIAGNOSIS — Z794 Long term (current) use of insulin: Secondary | ICD-10-CM | POA: Diagnosis not present

## 2016-02-29 DIAGNOSIS — K219 Gastro-esophageal reflux disease without esophagitis: Secondary | ICD-10-CM | POA: Diagnosis present

## 2016-02-29 DIAGNOSIS — M4806 Spinal stenosis, lumbar region: Secondary | ICD-10-CM | POA: Diagnosis present

## 2016-02-29 DIAGNOSIS — N183 Chronic kidney disease, stage 3 (moderate): Secondary | ICD-10-CM | POA: Diagnosis present

## 2016-02-29 DIAGNOSIS — B1920 Unspecified viral hepatitis C without hepatic coma: Secondary | ICD-10-CM | POA: Diagnosis present

## 2016-02-29 DIAGNOSIS — E1122 Type 2 diabetes mellitus with diabetic chronic kidney disease: Secondary | ICD-10-CM | POA: Diagnosis present

## 2016-02-29 DIAGNOSIS — M7138 Other bursal cyst, other site: Secondary | ICD-10-CM | POA: Diagnosis not present

## 2016-02-29 DIAGNOSIS — I130 Hypertensive heart and chronic kidney disease with heart failure and stage 1 through stage 4 chronic kidney disease, or unspecified chronic kidney disease: Secondary | ICD-10-CM | POA: Diagnosis not present

## 2016-02-29 DIAGNOSIS — I2510 Atherosclerotic heart disease of native coronary artery without angina pectoris: Secondary | ICD-10-CM | POA: Diagnosis not present

## 2016-02-29 DIAGNOSIS — I255 Ischemic cardiomyopathy: Secondary | ICD-10-CM | POA: Diagnosis not present

## 2016-02-29 DIAGNOSIS — M549 Dorsalgia, unspecified: Secondary | ICD-10-CM | POA: Diagnosis present

## 2016-02-29 DIAGNOSIS — H409 Unspecified glaucoma: Secondary | ICD-10-CM | POA: Diagnosis present

## 2016-02-29 DIAGNOSIS — Z79899 Other long term (current) drug therapy: Secondary | ICD-10-CM

## 2016-02-29 DIAGNOSIS — R937 Abnormal findings on diagnostic imaging of other parts of musculoskeletal system: Secondary | ICD-10-CM | POA: Diagnosis not present

## 2016-02-29 DIAGNOSIS — E785 Hyperlipidemia, unspecified: Secondary | ICD-10-CM | POA: Diagnosis not present

## 2016-02-29 DIAGNOSIS — B20 Human immunodeficiency virus [HIV] disease: Secondary | ICD-10-CM | POA: Diagnosis present

## 2016-02-29 DIAGNOSIS — N189 Chronic kidney disease, unspecified: Secondary | ICD-10-CM | POA: Diagnosis not present

## 2016-02-29 DIAGNOSIS — F17210 Nicotine dependence, cigarettes, uncomplicated: Secondary | ICD-10-CM | POA: Diagnosis present

## 2016-02-29 DIAGNOSIS — E1142 Type 2 diabetes mellitus with diabetic polyneuropathy: Secondary | ICD-10-CM | POA: Diagnosis not present

## 2016-02-29 DIAGNOSIS — M5416 Radiculopathy, lumbar region: Secondary | ICD-10-CM | POA: Diagnosis not present

## 2016-02-29 DIAGNOSIS — M109 Gout, unspecified: Secondary | ICD-10-CM | POA: Diagnosis present

## 2016-02-29 DIAGNOSIS — Z9889 Other specified postprocedural states: Secondary | ICD-10-CM

## 2016-02-29 HISTORY — PX: LUMBAR LAMINECTOMY/DECOMPRESSION MICRODISCECTOMY: SHX5026

## 2016-02-29 LAB — GLUCOSE, CAPILLARY
Glucose-Capillary: 196 mg/dL — ABNORMAL HIGH (ref 65–99)
Glucose-Capillary: 160 mg/dL — ABNORMAL HIGH (ref 65–99)
Glucose-Capillary: 163 mg/dL — ABNORMAL HIGH (ref 65–99)

## 2016-02-29 LAB — BASIC METABOLIC PANEL
Anion gap: 7 (ref 5–15)
BUN: 22 mg/dL — ABNORMAL HIGH (ref 6–20)
CALCIUM: 8.6 mg/dL — AB (ref 8.9–10.3)
CHLORIDE: 110 mmol/L (ref 101–111)
CO2: 20 mmol/L — AB (ref 22–32)
CREATININE: 1.61 mg/dL — AB (ref 0.61–1.24)
GFR calc non Af Amer: 43 mL/min — ABNORMAL LOW (ref >60)
GFR, EST AFRICAN AMERICAN: 49 mL/min — AB (ref >60)
GLUCOSE: 156 mg/dL — AB (ref 65–99)
Potassium: 5 mmol/L (ref 3.5–5.1)
Sodium: 137 mmol/L (ref 135–145)

## 2016-02-29 SURGERY — LUMBAR LAMINECTOMY/DECOMPRESSION MICRODISCECTOMY
Anesthesia: General | Site: Spine Lumbar

## 2016-02-29 MED ORDER — HYDROMORPHONE HCL 1 MG/ML IJ SOLN
0.2500 mg | INTRAMUSCULAR | Status: DC | PRN
  Administered 2016-02-29 (×4): 0.5 mg via INTRAVENOUS

## 2016-02-29 MED ORDER — INSULIN ASPART 100 UNIT/ML ~~LOC~~ SOLN
0.0000 [IU] | Freq: Three times a day (TID) | SUBCUTANEOUS | Status: DC
  Administered 2016-03-01: 2 [IU] via SUBCUTANEOUS
  Administered 2016-03-01: 5 [IU] via SUBCUTANEOUS

## 2016-02-29 MED ORDER — MENTHOL 3 MG MT LOZG: 1.0000 | LOZENGE | OROMUCOSAL | Status: DC | PRN

## 2016-02-29 MED ORDER — PHENYLEPHRINE HCL 10 MG/ML IJ SOLN
INTRAMUSCULAR | Status: DC | PRN
  Administered 2016-02-29: 120 ug via INTRAVENOUS
  Administered 2016-02-29: 80 ug via INTRAVENOUS
  Administered 2016-02-29: 120 ug via INTRAVENOUS

## 2016-02-29 MED ORDER — LIDOCAINE 2% (20 MG/ML) 5 ML SYRINGE
INTRAMUSCULAR | Status: AC
  Filled 2016-02-29: qty 5

## 2016-02-29 MED ORDER — OXYCODONE-ACETAMINOPHEN 5-325 MG PO TABS
1.0000 | ORAL_TABLET | ORAL | Status: DC | PRN
  Administered 2016-02-29 – 2016-03-01 (×4): 1 via ORAL
  Filled 2016-02-29 (×4): qty 1

## 2016-02-29 MED ORDER — 0.9 % SODIUM CHLORIDE (POUR BTL) OPTIME
TOPICAL | Status: DC | PRN
  Administered 2016-02-29: 1000 mL

## 2016-02-29 MED ORDER — ROCURONIUM BROMIDE 50 MG/5ML IV SOLN
INTRAVENOUS | Status: AC
  Filled 2016-02-29: qty 1

## 2016-02-29 MED ORDER — HYDROMORPHONE HCL 1 MG/ML IJ SOLN
INTRAMUSCULAR | Status: AC
  Administered 2016-02-29: 0.5 mg via INTRAVENOUS
  Filled 2016-02-29: qty 1

## 2016-02-29 MED ORDER — PROPOFOL 10 MG/ML IV BOLUS
INTRAVENOUS | Status: DC | PRN
  Administered 2016-02-29: 100 mg via INTRAVENOUS

## 2016-02-29 MED ORDER — ACETAMINOPHEN 650 MG RE SUPP: 650.0000 mg | RECTAL | Status: DC | PRN

## 2016-02-29 MED ORDER — ROCURONIUM BROMIDE 100 MG/10ML IV SOLN
INTRAVENOUS | Status: DC | PRN
  Administered 2016-02-29: 30 mg via INTRAVENOUS
  Administered 2016-02-29: 10 mg via INTRAVENOUS
  Administered 2016-02-29: 30 mg via INTRAVENOUS

## 2016-02-29 MED ORDER — ALLOPURINOL 100 MG PO TABS
100.0000 mg | ORAL_TABLET | Freq: Every day | ORAL | Status: DC
  Administered 2016-03-01: 100 mg via ORAL
  Filled 2016-02-29: qty 1

## 2016-02-29 MED ORDER — INSULIN LISPRO 100 UNIT/ML (KWIKPEN)
8.0000 [IU] | PEN_INJECTOR | Freq: Three times a day (TID) | SUBCUTANEOUS | Status: DC
  Filled 2016-02-29: qty 3

## 2016-02-29 MED ORDER — FENTANYL CITRATE (PF) 250 MCG/5ML IJ SOLN
INTRAMUSCULAR | Status: AC
  Filled 2016-02-29: qty 5

## 2016-02-29 MED ORDER — SODIUM CHLORIDE 0.9% FLUSH
3.0000 mL | Freq: Two times a day (BID) | INTRAVENOUS | Status: DC
Start: 2016-02-29 — End: 2016-03-01
  Administered 2016-02-29: 3 mL via INTRAVENOUS

## 2016-02-29 MED ORDER — METHOCARBAMOL 500 MG PO TABS
500.0000 mg | ORAL_TABLET | Freq: Four times a day (QID) | ORAL | Status: DC | PRN
  Administered 2016-02-29 – 2016-03-01 (×2): 500 mg via ORAL
  Filled 2016-02-29 (×3): qty 1

## 2016-02-29 MED ORDER — NEOSTIGMINE METHYLSULFATE 5 MG/5ML IV SOSY
PREFILLED_SYRINGE | INTRAVENOUS | Status: AC
  Filled 2016-02-29: qty 5

## 2016-02-29 MED ORDER — ONDANSETRON HCL 4 MG/2ML IJ SOLN: 4.0000 mg | Freq: Once | INTRAMUSCULAR | Status: DC | PRN

## 2016-02-29 MED ORDER — INSULIN LISPRO 100 UNIT/ML ~~LOC~~ SOLN
8.0000 [IU] | Freq: Three times a day (TID) | SUBCUTANEOUS | Status: DC
  Filled 2016-02-29: qty 10

## 2016-02-29 MED ORDER — BUPIVACAINE HCL (PF) 0.25 % IJ SOLN
INTRAMUSCULAR | Status: DC | PRN
  Administered 2016-02-29: 10 mL

## 2016-02-29 MED ORDER — GLYCOPYRROLATE 0.2 MG/ML IJ SOLN
INTRAMUSCULAR | Status: DC | PRN
  Administered 2016-02-29: 0.3 mg via INTRAVENOUS

## 2016-02-29 MED ORDER — THROMBIN 20000 UNITS EX SOLR
CUTANEOUS | Status: AC
  Filled 2016-02-29: qty 20000

## 2016-02-29 MED ORDER — ONDANSETRON HCL 4 MG/2ML IJ SOLN: 4.0000 mg | INTRAMUSCULAR | Status: DC | PRN

## 2016-02-29 MED ORDER — SODIUM CHLORIDE 0.9% FLUSH: 3.0000 mL | INTRAVENOUS | Status: DC | PRN

## 2016-02-29 MED ORDER — DOCUSATE SODIUM 100 MG PO CAPS
100.0000 mg | ORAL_CAPSULE | Freq: Two times a day (BID) | ORAL | Status: DC
  Administered 2016-02-29 – 2016-03-01 (×2): 100 mg via ORAL
  Filled 2016-02-29 (×2): qty 1

## 2016-02-29 MED ORDER — METHOCARBAMOL 1000 MG/10ML IJ SOLN
500.0000 mg | Freq: Four times a day (QID) | INTRAVENOUS | Status: DC | PRN
  Filled 2016-02-29: qty 5

## 2016-02-29 MED ORDER — ELVITEG-COBIC-EMTRICIT-TENOFAF 150-150-200-10 MG PO TABS
1.0000 | ORAL_TABLET | Freq: Every day | ORAL | Status: DC
  Administered 2016-03-01: 1 via ORAL
  Filled 2016-02-29 (×3): qty 1

## 2016-02-29 MED ORDER — ISOSORBIDE MONONITRATE ER 30 MG PO TB24
30.0000 mg | ORAL_TABLET | Freq: Every day | ORAL | Status: DC
Start: 2016-03-01 — End: 2016-03-01
  Administered 2016-03-01: 30 mg via ORAL
  Filled 2016-02-29: qty 1

## 2016-02-29 MED ORDER — SODIUM CHLORIDE 0.45 % IV SOLN
INTRAVENOUS | Status: DC
  Administered 2016-02-29: 85 mL/h via INTRAVENOUS
  Administered 2016-02-29: 18:00:00 via INTRAVENOUS

## 2016-02-29 MED ORDER — INSULIN ASPART 100 UNIT/ML ~~LOC~~ SOLN: 0.0000 [IU] | Freq: Every day | SUBCUTANEOUS | Status: DC

## 2016-02-29 MED ORDER — NITROGLYCERIN 0.3 MG SL SUBL
0.3000 mg | SUBLINGUAL_TABLET | SUBLINGUAL | Status: DC | PRN
  Filled 2016-02-29: qty 100

## 2016-02-29 MED ORDER — POLYETHYLENE GLYCOL 3350 17 G PO PACK: 17.0000 g | PACK | Freq: Every day | ORAL | Status: DC | PRN

## 2016-02-29 MED ORDER — THROMBIN 20000 UNITS EX SOLR
CUTANEOUS | Status: DC | PRN
  Administered 2016-02-29: 20 mL via TOPICAL

## 2016-02-29 MED ORDER — ARFORMOTEROL TARTRATE 15 MCG/2ML IN NEBU
15.0000 ug | INHALATION_SOLUTION | Freq: Two times a day (BID) | RESPIRATORY_TRACT | Status: DC
  Administered 2016-02-29 – 2016-03-01 (×2): 15 ug via RESPIRATORY_TRACT
  Filled 2016-02-29 (×2): qty 2

## 2016-02-29 MED ORDER — HYDRALAZINE HCL 25 MG PO TABS
25.0000 mg | ORAL_TABLET | Freq: Three times a day (TID) | ORAL | Status: DC
  Administered 2016-02-29 – 2016-03-01 (×2): 25 mg via ORAL
  Filled 2016-02-29 (×2): qty 1

## 2016-02-29 MED ORDER — PHENYLEPHRINE HCL 10 MG/ML IJ SOLN
10.0000 mg | INTRAVENOUS | Status: DC | PRN
  Administered 2016-02-29: 20 ug/min via INTRAVENOUS

## 2016-02-29 MED ORDER — UMECLIDINIUM BROMIDE 62.5 MCG/INH IN AEPB
1.0000 | INHALATION_SPRAY | Freq: Every day | RESPIRATORY_TRACT | Status: DC
  Filled 2016-02-29: qty 7

## 2016-02-29 MED ORDER — SODIUM CHLORIDE 0.9 % IV SOLN
250.0000 mL | INTRAVENOUS | Status: DC
Start: 2016-02-29 — End: 2016-03-01

## 2016-02-29 MED ORDER — SODIUM CHLORIDE 0.9 % IV SOLN
INTRAVENOUS | Status: DC
  Administered 2016-02-29 (×2): via INTRAVENOUS

## 2016-02-29 MED ORDER — ONDANSETRON HCL 4 MG/2ML IJ SOLN
INTRAMUSCULAR | Status: DC | PRN
  Administered 2016-02-29: 4 mg via INTRAVENOUS

## 2016-02-29 MED ORDER — CEFAZOLIN SODIUM-DEXTROSE 2-4 GM/100ML-% IV SOLN
2.0000 g | INTRAVENOUS | Status: AC
  Administered 2016-02-29: 2 g via INTRAVENOUS
  Filled 2016-02-29: qty 100

## 2016-02-29 MED ORDER — CARVEDILOL 25 MG PO TABS
25.0000 mg | ORAL_TABLET | Freq: Two times a day (BID) | ORAL | Status: DC
  Administered 2016-02-29 – 2016-03-01 (×2): 25 mg via ORAL
  Filled 2016-02-29 (×2): qty 1

## 2016-02-29 MED ORDER — FENTANYL CITRATE (PF) 250 MCG/5ML IJ SOLN
INTRAMUSCULAR | Status: DC | PRN
  Administered 2016-02-29: 50 ug via INTRAVENOUS
  Administered 2016-02-29: 100 ug via INTRAVENOUS

## 2016-02-29 MED ORDER — UMECLIDINIUM-VILANTEROL 62.5-25 MCG/INH IN AEPB
1.0000 | INHALATION_SPRAY | Freq: Every day | RESPIRATORY_TRACT | Status: DC
  Filled 2016-02-29: qty 14

## 2016-02-29 MED ORDER — MEPERIDINE HCL 25 MG/ML IJ SOLN: 6.2500 mg | INTRAMUSCULAR | Status: DC | PRN

## 2016-02-29 MED ORDER — ACETAMINOPHEN 325 MG PO TABS: 650.0000 mg | ORAL_TABLET | ORAL | Status: DC | PRN

## 2016-02-29 MED ORDER — GABAPENTIN 300 MG PO CAPS
300.0000 mg | ORAL_CAPSULE | Freq: Three times a day (TID) | ORAL | Status: DC
  Administered 2016-02-29 – 2016-03-01 (×2): 300 mg via ORAL
  Filled 2016-02-29 (×2): qty 1

## 2016-02-29 MED ORDER — ONDANSETRON HCL 4 MG/2ML IJ SOLN
INTRAMUSCULAR | Status: AC
  Filled 2016-02-29: qty 2

## 2016-02-29 MED ORDER — PHENYLEPHRINE 40 MCG/ML (10ML) SYRINGE FOR IV PUSH (FOR BLOOD PRESSURE SUPPORT)
PREFILLED_SYRINGE | INTRAVENOUS | Status: AC
Start: 2016-02-29 — End: 2016-02-29
  Filled 2016-02-29: qty 10

## 2016-02-29 MED ORDER — ATORVASTATIN CALCIUM 20 MG PO TABS
20.0000 mg | ORAL_TABLET | Freq: Every day | ORAL | Status: DC
Start: 2016-02-29 — End: 2016-03-01
  Administered 2016-02-29 – 2016-03-01 (×2): 20 mg via ORAL
  Filled 2016-02-29 (×2): qty 1

## 2016-02-29 MED ORDER — CEFAZOLIN SODIUM 1-5 GM-% IV SOLN
1.0000 g | Freq: Three times a day (TID) | INTRAVENOUS | Status: AC
  Administered 2016-02-29 – 2016-03-01 (×2): 1 g via INTRAVENOUS
  Filled 2016-02-29 (×2): qty 50

## 2016-02-29 MED ORDER — BUPIVACAINE HCL (PF) 0.25 % IJ SOLN
INTRAMUSCULAR | Status: AC
  Filled 2016-02-29: qty 30

## 2016-02-29 MED ORDER — NEOSTIGMINE METHYLSULFATE 10 MG/10ML IV SOLN
INTRAVENOUS | Status: DC | PRN
  Administered 2016-02-29: 2 mg via INTRAVENOUS

## 2016-02-29 MED ORDER — PROPOFOL 10 MG/ML IV BOLUS
INTRAVENOUS | Status: AC
  Filled 2016-02-29: qty 20

## 2016-02-29 MED ORDER — LATANOPROST 0.005 % OP SOLN
1.0000 [drp] | Freq: Every day | OPHTHALMIC | Status: DC
  Administered 2016-02-29: 1 [drp] via OPHTHALMIC
  Filled 2016-02-29: qty 2.5

## 2016-02-29 MED ORDER — MIDAZOLAM HCL 2 MG/2ML IJ SOLN
INTRAMUSCULAR | Status: DC | PRN
  Administered 2016-02-29: 2 mg via INTRAVENOUS

## 2016-02-29 MED ORDER — INSULIN GLARGINE 100 UNIT/ML ~~LOC~~ SOLN
55.0000 [IU] | Freq: Every day | SUBCUTANEOUS | Status: DC
  Administered 2016-03-01: 55 [IU] via SUBCUTANEOUS
  Filled 2016-02-29: qty 0.55

## 2016-02-29 MED ORDER — LIDOCAINE HCL (CARDIAC) 20 MG/ML IV SOLN
INTRAVENOUS | Status: DC | PRN
  Administered 2016-02-29: 80 mg via INTRAVENOUS

## 2016-02-29 MED ORDER — MIDAZOLAM HCL 2 MG/2ML IJ SOLN
INTRAMUSCULAR | Status: AC
  Filled 2016-02-29: qty 2

## 2016-02-29 MED ORDER — GLYCOPYRROLATE 0.2 MG/ML IV SOSY
PREFILLED_SYRINGE | INTRAVENOUS | Status: AC
  Filled 2016-02-29: qty 3

## 2016-02-29 MED ORDER — PHENOL 1.4 % MT LIQD: 1.0000 | OROMUCOSAL | Status: DC | PRN

## 2016-02-29 MED ORDER — HYDROMORPHONE HCL 1 MG/ML IJ SOLN
INTRAMUSCULAR | Status: AC
  Filled 2016-02-29: qty 1

## 2016-02-29 MED ORDER — HEMOSTATIC AGENTS (NO CHARGE) OPTIME
TOPICAL | Status: DC | PRN
  Administered 2016-02-29: 1 via TOPICAL

## 2016-02-29 MED ORDER — HYDROMORPHONE HCL 1 MG/ML IJ SOLN
0.5000 mg | INTRAMUSCULAR | Status: DC | PRN
  Administered 2016-02-29 – 2016-03-01 (×3): 1 mg via INTRAVENOUS
  Filled 2016-02-29 (×3): qty 1

## 2016-02-29 SURGICAL SUPPLY — 45 items
BUR ROUND FLUTED 4 SOFT TCH (BURR) IMPLANT
BUR ROUND FLUTED 4MM SOFT TCH (BURR)
CLOSURE STERI-STRIP 1/2X4 (GAUZE/BANDAGES/DRESSINGS) ×1
CLSR STERI-STRIP ANTIMIC 1/2X4 (GAUZE/BANDAGES/DRESSINGS) ×2 IMPLANT
COVER SURGICAL LIGHT HANDLE (MISCELLANEOUS) ×3 IMPLANT
DERMABOND ADVANCED (GAUZE/BANDAGES/DRESSINGS) ×2
DERMABOND ADVANCED .7 DNX12 (GAUZE/BANDAGES/DRESSINGS) ×1 IMPLANT
DRAPE MICROSCOPE LEICA (MISCELLANEOUS) ×3 IMPLANT
DRAPE PROXIMA HALF (DRAPES) ×6 IMPLANT
DRSG MEPILEX BORDER 4X4 (GAUZE/BANDAGES/DRESSINGS) ×3 IMPLANT
DRSG MEPILEX BORDER 4X8 (GAUZE/BANDAGES/DRESSINGS) ×3 IMPLANT
DURAPREP 26ML APPLICATOR (WOUND CARE) ×3 IMPLANT
DURASEAL SPINE SEALANT 3ML (MISCELLANEOUS) ×3 IMPLANT
ELECT REM PT RETURN 9FT ADLT (ELECTROSURGICAL) ×3
ELECTRODE REM PT RTRN 9FT ADLT (ELECTROSURGICAL) ×1 IMPLANT
GLOVE BIOGEL PI IND STRL 8 (GLOVE) ×2 IMPLANT
GLOVE BIOGEL PI INDICATOR 8 (GLOVE) ×4
GLOVE ORTHO TXT STRL SZ7.5 (GLOVE) ×6 IMPLANT
GOWN STRL REUS W/ TWL LRG LVL3 (GOWN DISPOSABLE) ×2 IMPLANT
GOWN STRL REUS W/ TWL XL LVL3 (GOWN DISPOSABLE) ×1 IMPLANT
GOWN STRL REUS W/TWL 2XL LVL3 (GOWN DISPOSABLE) ×3 IMPLANT
GOWN STRL REUS W/TWL LRG LVL3 (GOWN DISPOSABLE) ×4
GOWN STRL REUS W/TWL XL LVL3 (GOWN DISPOSABLE) ×2
KIT BASIN OR (CUSTOM PROCEDURE TRAY) ×3 IMPLANT
KIT ROOM TURNOVER OR (KITS) ×3 IMPLANT
MANIFOLD NEPTUNE II (INSTRUMENTS) IMPLANT
NEEDLE 18GX1X1/2 (RX/OR ONLY) (NEEDLE) ×3 IMPLANT
NEEDLE HYPO 25GX1X1/2 BEV (NEEDLE) ×3 IMPLANT
NEEDLE SPNL 18GX3.5 QUINCKE PK (NEEDLE) ×3 IMPLANT
NS IRRIG 1000ML POUR BTL (IV SOLUTION) ×3 IMPLANT
PACK LAMINECTOMY ORTHO (CUSTOM PROCEDURE TRAY) ×3 IMPLANT
PAD ARMBOARD 7.5X6 YLW CONV (MISCELLANEOUS) ×6 IMPLANT
PATTIES SURGICAL .5 X.5 (GAUZE/BANDAGES/DRESSINGS) IMPLANT
PATTIES SURGICAL .75X.75 (GAUZE/BANDAGES/DRESSINGS) ×3 IMPLANT
SPONGE SURGIFOAM ABS GEL 100 (HEMOSTASIS) ×3 IMPLANT
SUT BONE WAX W31G (SUTURE) IMPLANT
SUT NURALON 4 0 TR CR/8 (SUTURE) ×3 IMPLANT
SUT VIC AB 0 CT1 27 (SUTURE) ×2
SUT VIC AB 0 CT1 27XBRD ANBCTR (SUTURE) ×1 IMPLANT
SUT VIC AB 2-0 CT1 27 (SUTURE) ×2
SUT VIC AB 2-0 CT1 TAPERPNT 27 (SUTURE) ×1 IMPLANT
SUT VIC AB 3-0 X1 27 (SUTURE) IMPLANT
TOWEL OR 17X24 6PK STRL BLUE (TOWEL DISPOSABLE) ×3 IMPLANT
TOWEL OR 17X26 10 PK STRL BLUE (TOWEL DISPOSABLE) ×3 IMPLANT
WATER STERILE IRR 1000ML POUR (IV SOLUTION) IMPLANT

## 2016-02-29 NOTE — Anesthesia Procedure Notes (Signed)
Procedure Name: Intubation Date/Time: 02/29/2016 2:41 PM Performed by: Sampson Si E Pre-anesthesia Checklist: Patient identified, Emergency Drugs available, Suction available, Patient being monitored and Timeout performed Patient Re-evaluated:Patient Re-evaluated prior to inductionOxygen Delivery Method: Circle system utilized Preoxygenation: Pre-oxygenation with 100% oxygen Intubation Type: IV induction Ventilation: Mask ventilation without difficulty Laryngoscope Size: Mac and 3 Grade View: Grade I Tube type: Oral Tube size: 7.5 mm Number of attempts: 1 Airway Equipment and Method: Stylet Placement Confirmation: ETT inserted through vocal cords under direct vision,  positive ETCO2 and breath sounds checked- equal and bilateral Secured at: 21 cm Tube secured with: Tape Dental Injury: Teeth and Oropharynx as per pre-operative assessment

## 2016-02-29 NOTE — Transfer of Care (Signed)
Immediate Anesthesia Transfer of Care Note  Patient: Jacob [name]  Procedure(s) Performed: Procedure(s): Left L4-5 Lateral Recess Decompression, Removal Extradural Intraspinal Facet Cyst (N/A)  Patient Location: PACU  Anesthesia Type:General  Level of Consciousness: awake  Airway & Oxygen Therapy: Patient Spontanous Breathing and Patient connected to nasal cannula oxygen  Post-op Assessment: Report given to RN and Patient moving all extremities X 4  Post vital signs: Reviewed and stable  Last Vitals:  Filed Vitals:   02/29/16 1249 02/29/16 1646  BP: 166/86 154/81  Pulse: 74 77  Temp: 36.7 C 36.7 C  Resp: 20 11    Last Pain:  Filed Vitals:   02/29/16 1646  PainSc: 4          Complications: No apparent anesthesia complications

## 2016-02-29 NOTE — Interval H&P Note (Signed)
History and Physical Interval Note:  02/29/2016 2:05 PM  [name]  has presented today for surgery, with the diagnosis of Left L4-5 Intraspinal Facet Cyst, Lateral Recess Stenosis, Left L5-S1 Stenosis  The various methods of treatment have been discussed with the patient and family. After consideration of risks, benefits and other options for treatment, the patient has consented to  Procedure(s): Left L4-5 Lateral Recess Decompression, Removal Extradural Intraspinal Facet Cyst, Possible Left L5-S1 lateral Recess Decompression (N/A) as a surgical intervention .  The patient's history has been reviewed, patient examined, no change in status, stable for surgery.  I have reviewed the patient's chart and labs.  Questions were answered to the patient's satisfaction.     YATES,MARK C

## 2016-02-29 NOTE — Anesthesia Postprocedure Evaluation (Signed)
Anesthesia Post Note  Patient: CAYMAN HANTHORN  Procedure(s) Performed: Procedure(s) (LRB): Left L4-5 Lateral Recess Decompression, Removal Extradural Intraspinal Facet Cyst (N/A)  Patient location during evaluation: PACU Anesthesia Type: General Level of consciousness: awake and alert and oriented Pain management: pain level controlled Vital Signs Assessment: post-procedure vital signs reviewed and stable Respiratory status: spontaneous breathing, nonlabored ventilation, respiratory function stable and patient connected to nasal cannula oxygen Cardiovascular status: blood pressure returned to baseline and stable Postop Assessment: no signs of nausea or vomiting Anesthetic complications: no    Last Vitals:  Filed Vitals:   02/29/16 1700 02/29/16 1715  BP: 141/85 146/86  Pulse: 77 79  Temp:    Resp: 15 13    Last Pain:  Filed Vitals:   02/29/16 1717  PainSc: Asleep                 Anniebelle Devore A.

## 2016-02-29 NOTE — Anesthesia Preprocedure Evaluation (Addendum)
Anesthesia Evaluation  Patient identified by MRN, date of birth, ID band Patient awake    Reviewed: Allergy & Precautions, NPO status , Patient's Chart, lab work & pertinent test results, reviewed documented beta blocker date and time   Airway Mallampati: II  TM Distance: >3 FB Neck ROM: Full    Dental no notable dental hx. (+) Loose, Partial Upper, Missing, Poor Dentition   Pulmonary shortness of breath and with exertion, pneumonia, resolved, Current Smoker,    Pulmonary exam normal breath sounds clear to auscultation       Cardiovascular hypertension, Pt. on medications and Pt. on home beta blockers + CAD and +CHF  Normal cardiovascular exam Rhythm:Regular Rate:Normal  Ischemic cardiomyopathy EF 24%   Neuro/Psych PSYCHIATRIC DISORDERS Diabetic peripheral neuropathy  Neuromuscular disease    GI/Hepatic GERD  Medicated and Controlled,(+)     substance abuse  cocaine use, Hepatitis -, Unspecified  Endo/Other  diabetes, Poorly Controlled, Type 2, Insulin DependentMorbid obesityHyperlipidemia  Renal/GU Renal InsufficiencyRenal disease  negative genitourinary   Musculoskeletal  (+) Arthritis , Osteoarthritis,  Left L4-5 Intraspinal Facet Cyst, Lateral Recess Stenosis, Left L5-S1 Stenosis   Abdominal   Peds  Hematology Syphilis   Anesthesia Other Findings   Reproductive/Obstetrics                          Anesthesia Physical Anesthesia Plan  ASA: III  Anesthesia Plan: General   Post-op Pain Management:    Induction: Intravenous and Cricoid pressure planned  Airway Management Planned: Oral ETT  Additional Equipment: Arterial line  Intra-op Plan:   Post-operative Plan: Extubation in OR  Informed Consent: I have reviewed the patients History and Physical, chart, labs and discussed the procedure including the risks, benefits and alternatives for the proposed anesthesia with the patient or  authorized representative who has indicated his/her understanding and acceptance.   Dental advisory given  Plan Discussed with: CRNA, Anesthesiologist and Surgeon  Anesthesia Plan Comments:         Anesthesia Quick Evaluation

## 2016-02-29 NOTE — H&P (Signed)
Jacob [name] is an 65 y.o. male.    HISTORY OF PRESENT ILLNESS:  A 65 year old male is seen, referred by Dr. Ernestina Patches for surgical consultation.  He had epidural injections in the past and states it only worked as long as he was in the office.  Within an hour or so he had significant recurrence of pain.  He has had back pain and left leg pain.  He has pain with straight leg raising.  He has had a past history of a fall going to the bus stop to get to [facility], had a fall, had a hip fracture, had surgery to fix it, and states since that time he has had some back trouble.  He had a previous MRI in 2012 which showed narrowing and collapse of the 5-1 interspace with severe foraminal stenosis.  The 4-5 level did not show severe compression.  He had a new MRI done on 12/29/2015 which showed progressive degeneration of 5-1 which is 80% collapsed in disk space height without significant listhesis.  He had diskogenic changes on the left.  At L4-5 he had a complex left-sided synovial cyst causing compression with lateral recess stenosis and mild foraminal narrowing at 4-5.  There was some right foraminal stenosis at 5-1, but he is asymptomatic on the right side.  Patient has been on tramadol for pain.    CURRENT MEDICATIONS:  Patient is HIV positive and is on Lantus insulin 9 units at bedtime for his diabetes.  He takes allopurinol 100 mg daily, baby aspirin 81 mg daily, Lipitor 40 mg, Coreg 25 mg, colchicine 0.6 mg p.r.n., Vasotec 5 mg 2 at night, Combivir 150/300 mg 1 p.o. b.i.d., Glucophage 500 mg daily, Naftin cream applied topically daily, nitroglycerin p.r.n.  In the past, patient had been on Reyataz, but this has been stopped.  Patient states they just stopped his Combivir also.    Past Medical History  Diagnosis Date  . Syphilis, unspecified   . Human immunodeficiency virus (HIV) disease ([place])     takes Genvoya daily  . Gout, unspecified     takes Allopurinol daily as well as Colchicine-if needed  .  Hyperlipidemia     takes Atorvastatin daily  . Ischemic cardiomyopathy   . Drug abuse     hx; tested for cocaine as recently as 2/08. says he is not using drugs now - avoided defib. for this reason   . Chronic kidney disease, unspecified ([place])   . Arthritis   . Muscle spasm     takes Zanaflex as needed  . HTN (hypertension)     takes Coreg,Imdur.and Apresoline daily  . Coronary atherosclerosis of native coronary artery 2005    s/p surgery  . Coronary atherosclerosis of artery bypass graft   . CHF (congestive heart failure) ([facility])     not on any meds  . Shortness of breath dyspnea     rarely but if notices it then with exertion  . Pneumonia     hx of  . Peripheral neuropathy ([facility])     takes gabapentin daily  . Chronic back pain     stenosis  . GERD (gastroesophageal reflux disease)     takes OTC meds as needed  . Hepatitis 1967    Hep C  . History of colon polyps     benign  . Nocturia   . Type II or unspecified type diabetes mellitus without mention of complication, not stated as uncontrolled 2004    Lantus daily.Average fasting  blood sugar 125-199  . Cataracts, bilateral     immature  . Glaucoma     uses eye drops daily    Past Surgical History  Procedure Laterality Date  . Left intertrochanteric hip fracture  2004    s/p intermedullary nail placement 2/08  . Left transverse mandibular fracture  10/05  . Laproscopic cholecystectomy  8/07  . Colonoscopy  2013    Dr.John Henrene Pastor   . Coronary artery bypass graft      Family History  Problem Relation Age of Onset  . Heart failure Father   . Hypertension Father   . Diabetes Brother   . Heart attack Brother   . Colon cancer Neg Hx   . Alzheimer's disease Mother   . Stroke Sister   . Diabetes Sister   . Hypertension Brother   . Diabetes Brother   . Drug abuse Brother   . Alzheimer's disease Sister    Social History:  reports that he has been smoking Cigarettes.  He has a 41 pack-year smoking history. He has  never used smokeless tobacco. He reports that he drinks about 3.6 oz of alcohol per week. He reports that he does not use illicit drugs.  Allergies:  Allergies  Allergen Reactions  . Amphetamines Other (See Comments)    Pt is unaware of this -     No prescriptions prior to admission    No results found for this or any previous visit (from the past 48 hour(s)). No results found.  Review of Systems  Constitutional: Negative.   HENT: Negative.   Eyes: Negative.   Respiratory: Negative.   Cardiovascular: Negative.   Gastrointestinal: Negative.   Genitourinary: Negative.   Musculoskeletal: Positive for back pain.  Skin: Negative.   Neurological: Negative.   Psychiatric/Behavioral: Negative.     There were no vitals taken for this visit. Physical Exam  Constitutional: He is oriented to person, place, and time. No distress.  HENT:  Head: Atraumatic.  Eyes: EOM are normal.  Neck: Normal range of motion.  Cardiovascular: Normal rate.   Respiratory: No respiratory distress.  GI: He exhibits no distension.  Musculoskeletal: He exhibits tenderness.  Neurological: He is alert and oriented to person, place, and time.  Skin: Skin is warm and dry.  Psychiatric: He has a normal mood and affect.      PHYSICAL EXAMINATION:  Patient is alert and oriented.  No rash over exposed skin.  Skin over the back is normal.  He has sciatic notch tenderness on the left.  Slow ambulation, heel-toe gait.  EHL anterior tib is strong.  He has pain with straight leg raising above 90 degrees.  No audible wheezing.  No accessory muscle inspiratory effort.  Pulses regular.  Height 6 feet, weight 191.   RADIOGRAPHS/TEST:  MRI scan is reviewed which shows a new L5 intraspinous cyst with lateral recess stenosis on the left.  He does have significant foraminal stenosis on the left at L5-S1 which is only mildly changed since his previous scan which had already shown collapse of the 5-1 interspace of about 80%  which is stable.  He does have diskogenic edema present in the endplates.   PLAN:  We discussed options with him, and he states that he is not able to handle the pain and wants to proceed with operative intervention.  The plan would be a left L4-5 intraspinal facet cyst debridement.  Possible left L5-S1 lateral recess decompression from overhanging spurs.  Procedure discussed with patient, overnight  stay in the hospital.  He has a neighbor who can make arrangements so they could spend a day with him who lives in an adjacent apartment close by.  Patient would need to have preoperative clearance with Dr. [name] at [facility].  We discussed risks including recurrence of the cyst, possible progression of disk degeneration.  We discussed other options including instrumented fusion which includes the risk of pseudarthrosis, nonhealing of the fusion, nerve root damage, hardware problems, screw breakage, etc.  The plan would be simple decompression, removal of intraspinal cyst to give his nerve root more room and give him some relief.  All questions answered.  He would be in the hospital overnight.  Lanae Crumbly, PA-C 02/29/2016, 2:48 AM

## 2016-02-29 NOTE — Brief Op Note (Signed)
02/29/2016  4:49 PM  PATIENT:  Jacob [name]  65 y.o. male  PRE-OPERATIVE DIAGNOSIS:  Left L4-5 Intraspinal Facet Cyst, Lateral Recess Stenosis, Left L5-S1 Stenosis  POST-OPERATIVE DIAGNOSIS:  Left L4-5 Intraspinal Facet Cyst, Lateral Recess Stenosis, Left L5-S1 Stenosis, DURAL TEAR  PROCEDURE:  Procedure(s): Left L4-5 Lateral Recess Decompression, Removal Extradural Intraspinal Facet Cyst (N/A) WITH DURAL REPAIR  SURGEON:  Surgeon(s) and Role:    * Marybelle Killings, MD - Primary  PHYSICIAN ASSISTANT: Eriberto Felch m. Phillis Thackeray pa-c    ANESTHESIA:   general  EBL:  Total I/O In: 500 [I.V.:500] Out: 75 [Blood:75]  BLOOD ADMINISTERED:none  DRAINS: none   LOCAL MEDICATIONS USED:  MARCAINE     SPECIMEN:  No Specimen  DISPOSITION OF SPECIMEN:  N/A  COUNTS:  YES  TOURNIQUET:  * No tourniquets in log *  DICTATION: .Dragon Dictation  PLAN OF CARE: Admit to inpatient   PATIENT DISPOSITION:  PACU - hemodynamically stable.

## 2016-03-01 ENCOUNTER — Encounter (HOSPITAL_COMMUNITY): Payer: Self-pay | Admitting: Orthopaedic Surgery

## 2016-03-01 ENCOUNTER — Telehealth: Payer: Self-pay | Admitting: Internal Medicine

## 2016-03-01 LAB — GLUCOSE, CAPILLARY
GLUCOSE-CAPILLARY: 137 mg/dL — AB (ref 65–99)
Glucose-Capillary: 233 mg/dL — ABNORMAL HIGH (ref 65–99)

## 2016-03-01 LAB — CBC
HEMATOCRIT: 41.5 % (ref 39.0–52.0)
Hemoglobin: 14 g/dL (ref 13.0–17.0)
MCH: 32.5 pg (ref 26.0–34.0)
MCHC: 33.7 g/dL (ref 30.0–36.0)
MCV: 96.3 fL (ref 78.0–100.0)
Platelets: 152 10*3/uL (ref 150–400)
RBC: 4.31 MIL/uL (ref 4.22–5.81)
RDW: 13.1 % (ref 11.5–15.5)
WBC: 8.1 10*3/uL (ref 4.0–10.5)

## 2016-03-01 LAB — BASIC METABOLIC PANEL
Anion gap: 10 (ref 5–15)
BUN: 23 mg/dL — AB (ref 6–20)
CO2: 21 mmol/L — AB (ref 22–32)
Calcium: 8.7 mg/dL — ABNORMAL LOW (ref 8.9–10.3)
Chloride: 105 mmol/L (ref 101–111)
Creatinine, Ser: 1.32 mg/dL — ABNORMAL HIGH (ref 0.61–1.24)
GFR, EST NON AFRICAN AMERICAN: 54 mL/min — AB (ref >60)
Glucose, Bld: 127 mg/dL — ABNORMAL HIGH (ref 65–99)
Potassium: 5 mmol/L (ref 3.5–5.1)
Sodium: 136 mmol/L (ref 135–145)

## 2016-03-01 MED ORDER — OXYCODONE-ACETAMINOPHEN 5-325 MG PO TABS: 1.0000 | ORAL_TABLET | ORAL | Status: DC | PRN

## 2016-03-01 MED ORDER — KETOROLAC TROMETHAMINE 15 MG/ML IJ SOLN
30.0000 mg | Freq: Four times a day (QID) | INTRAMUSCULAR | Status: DC | PRN
  Administered 2016-03-01 (×2): 30 mg via INTRAVENOUS
  Filled 2016-03-01 (×2): qty 2

## 2016-03-01 MED ORDER — KETOROLAC TROMETHAMINE 15 MG/ML IJ SOLN
15.0000 mg | Freq: Four times a day (QID) | INTRAMUSCULAR | Status: DC | PRN
  Filled 2016-03-01: qty 1

## 2016-03-01 NOTE — Progress Notes (Signed)
Pt being discharged home via wheelchair with family. Pt alert and oriented x4. VSS. Pt c/o no pain at this time. No signs of respiratory distress. Education complete and care plans resolved. IV removed with catheter intact and pt tolerated well. No further issues at this time. Pt to follow up with PCP. Kiley Solimine R, RN 

## 2016-03-01 NOTE — Discharge Instructions (Signed)
Ambulate daily. OK to shower. Avoid lifting  And bending. Walk daily. See Dr. Lorin Mercy in one week.

## 2016-03-01 NOTE — Telephone Encounter (Signed)
Patient called the office this morning and stated he was at the hospital waiting to have surgery and wanted to know if he should take his medicine. I informed the patient that he should speak with the staff at the hospital that are caring for him before he takes anything. He said " well they paged the doctor but he might be in surgery, so I'm just going to take it" I instructed the patient again that he should to speak with the staff caring for him at the hospital before he takes anything. He said "Well I'm just going to take it, thank you" and hung up

## 2016-03-01 NOTE — Progress Notes (Signed)
Pt moved his position to 45 degree angle explained that he is to remain flat in bed until further orders. Pt expressed understanding and bed was locked. Arthor Captain LPN

## 2016-03-01 NOTE — Progress Notes (Signed)
MD on call notified that pt has order to lay flat for until further notice and inquired if foley can be placed until pt condition change new order given and foley placed will continue to monitor. Arthor Captain LPN

## 2016-03-01 NOTE — Progress Notes (Signed)
MD on call notified that pt pain level is not managed with ordered medications. New orders given will continue to monitor. Arthor Captain LPN

## 2016-03-01 NOTE — Op Note (Signed)
NAMEKATHERINE, ARIAS NO.:  [id number]  MEDICAL RECORD NO.:  [id number]  LOCATION:  5N23C                        FACILITY:  [facility]  PHYSICIAN:  Danetra Glock C. Lorin Mercy, M.D.    DATE OF BIRTH:  12-Nov-1950  DATE OF PROCEDURE:  02/29/2016 DATE OF DISCHARGE:                              OPERATIVE REPORT   PREOPERATIVE DIAGNOSIS:  Left L4-5 lateral recess stenosis secondary to extradural intraspinal facet cyst.  POSTOPERATIVE DIAGNOSIS:  Left L4-5 lateral recess stenosis secondary to extradural intraspinal facet cyst.  PROCEDURE:  Lateral recess decompression, left L4-5, removal of extradural intraspinal facet cyst.  SURGEON:  Keeley Sussman C. Lorin Mercy, M.D.  ASSISTANT:  Alyson Locket. Ricard Dillon, PA-C.medically necessary and present for the entire procedure.   ANESTHESIA:  General plus Marcaine local.  BRIEF HISTORY:  This is a 65 year old male who was referred to me for surgical treatment for an MRI that showed progressive collapse at L4-5 with new facet cyst causing severe lateral recess compression.  The patient has unfortunately multiple problems including gout, diabetes, HIV, hepatitis, chronic kidney disease, hypertension, coronary artery disease, status post bypass, peripheral neuropathy, hepatitis C, among other medical problems.  He failed conservative treatment, had an epidural.  MRI scan from December 29, 2015, showed degenerative complex synovial cyst, slight anterolisthesis at L4-5 and severe lateral recess stenosis.  DESCRIPTION OF PROCEDURE:  After standard prepping and draping, the patient in the prone position, chest rolls, arms at 90-90, rolled yellow pads 10-10 across the S1 level.  Back was prepped with DuraPrep.  The area was squared with towels, sterile skin marker marked for planned incision and Betadine Steri-Drape, laminectomy sheet and drape.  Time- out procedure was taken.  Antibiotics were given prophylactically.  Needle localization with the needle at L4-5  confirmed with the lateral x- ray.  Incision was made 2 mm off the midline, subperiosteal dissection with Taylor retractor placed laterally.  Lamina was thinned with the bur removing the L4 lamina and the top one-third of L5.  There were thick chunks of ligamentum, which were removed.  Continued cephalad exposure until the dura was visualized.  Following along the edge of the dura with operative microscope, draped using microdissection with the hockey stick Penfield #4.  Small chunks of disk and the cyst were excised with the scalpel.  Using dural forceps teeth, the cyst was being peeled off the dura and the dural tear occurred.  A patty was placed.  Continued removal of the cyst and then a small patty placed cephalad.  Once the cyst had been excised, little bit more bone removed laterally.  The disk was examined and had some mild bulging, but was not ruptured.  Entry into the foramina was opened.  A portion of the cyst was still adherent to the dura, but there being good decompression and 4-0 Nurolon was used with 2 simple sutures placed, which repaired the tear that occurred as the cyst was being separated from the dura with a 15 scalpel blade under microscopic visualization.  There was about a 2 mm width of the cyst still adherent to the dura over 4-5 mm area about the lateral aspect of the cyst and lateral recess had been  completely decompressed and bone had been removed out even with the pedicles.  Disk was able to be visualized and with the 2 sutures placed, it was felt that continue trying to remove the last remaining portion where the cyst was adherent and stuck to the dura was likely that we would get another tear of the dura.  CRNA squeezed at 30 cm of pressure with no CSF leak under direct visualization.  Final removal of some lateral bone and some remaining piece of ligament, the foramina was palpated with a hockey stick.  It was free.  Irrigation with saline solution again  checking the peak positive pressure to 30 showed no leak.  Some DuraSeal was dripped on to the top and then as it had jelled to a Jell-O type substance, the outer area of it was sucked up and thin, so there was just a thin layer on the dura still leaving a large amount of space in the lateral recess where the remaining 80-90% of the cyst had been excised.  The patient had some degenerative facet spurs that were overhanging that had been removed for decompression of lateral recess.  There was no abnormal pars.  Area was irrigated.  There was a good decompression of lateral recess.  With the adherent cyst causing severe compression, this was consistent with the patient's radicular pain that he had been having.  I discussed with him possibly looking at the L5-S1 level where there was some foraminal narrowing, but it was felt that it was just causing significant compression as visualized.  We left the L5-S1 level as it is.  L5-S1 level was not explored.  The fascia was then closed with #1 Vicryl sutures, 2-0 on subcutaneous tissue, subcuticular skin closure with Vicryl, Dermabond, and sealing to the skin, postop dressing, and transferred to recovery room where his medical problems, renal, blood sugar be carefully monitored.     Gerrett Loman C. Lorin Mercy, M.D.     MCY/MEDQ  D:  02/29/2016  T:  03/01/2016  Job:  [id number]

## 2016-03-01 NOTE — Progress Notes (Signed)
Subjective: 1 Day Post-Op Procedure(s) (LRB): Left L4-5 Lateral Recess Decompression, Removal Extradural Intraspinal Facet Cyst (N/A) Patient reports pain as 4 on 0-10 scale.  Incisional pain lumbar. No leg pain. No headache.  " I want to get up "  Foley inserted since patient unable to void in supine position.   Objective: Vital signs in last 24 hours: Temp:  [97.6 F (36.4 C)-98.6 F (37 C)] 98.6 F (37 C) (05/09 0500) Pulse Rate:  [62-82] 62 (05/09 0500) Resp:  [10-20] 18 (05/09 0500) BP: (109-190)/(72-101) 179/76 mmHg (05/09 0500) SpO2:  [96 %-100 %] 98 % (05/09 0500) Weight:  [86.183 kg (190 lb)] 86.183 kg (190 lb) (05/08 1249)  Intake/Output from previous day: 05/08 0701 - 05/09 0700 In: 1697.9 [I.V.:1597.9; IV Piggyback:100] Out: 1025 [Urine:950; Blood:75] Intake/Output this shift:     Recent Labs  03/01/16 0612  HGB 14.0    Recent Labs  03/01/16 0612  WBC 8.1  RBC 4.31  HCT 41.5  PLT 152    Recent Labs  02/29/16 1654  NA 137  K 5.0  CL 110  CO2 20*  BUN 22*  CREATININE 1.61*  GLUCOSE 156*  CALCIUM 8.6*   No results for input(s): LABPT, INR in the last 72 hours.  Neurologically intact ,  Left quads, AT, EHL, GS, peroneals, PT all 5/5. Sensory intact.  Assessment/Plan: 1 Day Post-Op Procedure(s) (LRB): Left L4-5 Lateral Recess Decompression, Removal Extradural Intraspinal Facet Cyst (N/A) Plan:  OOB , ambulate, d/c foley , S/L IV.    If ambulatory  D/c IV and discharge home.   Jacob [name] C 03/01/2016, 7:40 AM

## 2016-03-08 ENCOUNTER — Telehealth: Payer: Self-pay | Admitting: *Deleted

## 2016-03-08 DIAGNOSIS — E0869 Diabetes mellitus due to underlying condition with other specified complication: Principal | ICD-10-CM

## 2016-03-08 DIAGNOSIS — IMO0002 Reserved for concepts with insufficient information to code with codable children: Secondary | ICD-10-CM

## 2016-03-08 DIAGNOSIS — E0865 Diabetes mellitus due to underlying condition with hyperglycemia: Secondary | ICD-10-CM

## 2016-03-08 NOTE — Telephone Encounter (Signed)
Patient called and stated that he had a questions regarding his Diabetes. Experienced yesterday and the day before that he was alittle constipated but he made a BM, Orthopedic instructed him to take Colace twice daily. He stated that yesterday his BS was 401 in the morning and then 362 in the evening. He did have 3 icecream yesterday. Also has had his surgery. This morning it was 236. He stated that usually it is ok during the day just high in the evening.No blurred vision or slurring.  Please Advise.

## 2016-03-08 NOTE — Telephone Encounter (Signed)
He needs to reduce complex carbs and stop consuming foods high in sugar including ice cream. Recent surgery will also increase blood sugar. Increase lantus 60 units daily

## 2016-03-08 NOTE — Telephone Encounter (Signed)
LMOM to return call.

## 2016-03-09 MED ORDER — INSULIN GLARGINE 100 UNIT/ML SOLOSTAR PEN: 60.0000 [IU] | PEN_INJECTOR | Freq: Every day | SUBCUTANEOUS | Status: DC

## 2016-03-09 NOTE — Telephone Encounter (Signed)
Patient notified and agreed.Medication list updated. Rx faxed to [name] per patient.

## 2016-03-22 ENCOUNTER — Other Ambulatory Visit: Payer: Self-pay | Admitting: *Deleted

## 2016-03-22 DIAGNOSIS — M109 Gout, unspecified: Secondary | ICD-10-CM

## 2016-03-22 MED ORDER — ATORVASTATIN CALCIUM 20 MG PO TABS: 20.0000 mg | ORAL_TABLET | Freq: Every day | ORAL | Status: DC

## 2016-03-22 MED ORDER — UMECLIDINIUM-VILANTEROL 62.5-25 MCG/INH IN AEPB: 1.0000 | INHALATION_SPRAY | Freq: Every day | RESPIRATORY_TRACT | Status: DC

## 2016-03-22 MED ORDER — ALLOPURINOL 100 MG PO TABS: 100.0000 mg | ORAL_TABLET | Freq: Every day | ORAL | Status: DC

## 2016-03-22 NOTE — Telephone Encounter (Signed)
Patient requested. Faxed to pharmacy.  

## 2016-03-24 ENCOUNTER — Telehealth: Payer: Self-pay

## 2016-03-24 NOTE — Telephone Encounter (Signed)
Patient left message on voicemail questioning the dose change in lipitor. Patient was previously on 40 mg and when rx was last approved on 03-22-16 it was changed to 20 mg daily. Patient would like to know if the doctor changed his medication or if the was an error.  Message sent to Triage Assistance for clarification- Rodena Piety I see a comment on the lipitor that was filled on 03-22-16  saying " please d/c lipitor 40mg " was this change initiated by Dr.Carter, please advise.

## 2016-03-25 NOTE — Telephone Encounter (Signed)
Jacob Roup, RN  Jacob [name], CMA           When Mr Wetzell was switched to [facility] to preserve kidney function and bone integrity his Lipitor was decreased from 40 MG to 20 MG due to [facility] increasing the bioavailability of the Lipitor. Per M. Pham, [facility] pharmacist, if the patient is needing to decrease LDL, Zetia could be added to the patient's Lipitor.    If there are additional questions/concerns please address those to [facility].   Thank you.   Jacob Gauss, RN     Rodena Piety discussed with patient, patient will discuss Zetia at pending appointment.

## 2016-03-25 NOTE — Telephone Encounter (Signed)
Patient notified and agreed. Patient stated that he will bring medications so list can be updated at next appointment.

## 2016-03-25 NOTE — Discharge Summary (Signed)
[name] MRN: [id number] DOB/AGE: 01-13-1951 65 y.o.  Admit date: 02/29/2016 Discharge date: 03/25/2016  Admission Diagnoses:  Active Problems:   History of lumbar laminectomy for spinal cord decompression   Discharge Diagnoses:  Active Problems:   History of lumbar laminectomy for spinal cord decompression  status post Procedure(s): Left L4-5 Lateral Recess Decompression, Removal Extradural Intraspinal Facet Cyst  Past Medical History  Diagnosis Date  . Syphilis, unspecified   . Human immunodeficiency virus (HIV) disease ([place])     takes Genvoya daily  . Gout, unspecified     takes Allopurinol daily as well as Colchicine-if needed  . Hyperlipidemia     takes Atorvastatin daily  . Ischemic cardiomyopathy   . Drug abuse     hx; tested for cocaine as recently as 2/08. says he is not using drugs now - avoided defib. for this reason   . Chronic kidney disease, unspecified ([place])   . Arthritis   . Muscle spasm     takes Zanaflex as needed  . HTN (hypertension)     takes Coreg,Imdur.and Apresoline daily  . Coronary atherosclerosis of native coronary artery 2005    s/p surgery  . Coronary atherosclerosis of artery bypass graft   . CHF (congestive heart failure) ([facility])     not on any meds  . Shortness of breath dyspnea     rarely but if notices it then with exertion  . Pneumonia     hx of  . Peripheral neuropathy ([facility])     takes gabapentin daily  . Chronic back pain     stenosis  . GERD (gastroesophageal reflux disease)     takes OTC meds as needed  . Hepatitis 1967    Hep C  . History of colon polyps     benign  . Nocturia   . Type II or unspecified type diabetes mellitus without mention of complication, not stated as uncontrolled 2004    Lantus daily.Average fasting blood sugar 125-199  . Cataracts, bilateral     immature  . Glaucoma     uses eye drops daily    Surgeries: Procedure(s): Left L4-5 Lateral Recess Decompression, Removal  Extradural Intraspinal Facet Cyst on 02/29/2016   Consultants:    Discharged Condition: Improved  Hospital Course: JADARRIUS DUHART is an 65 y.o. male who was admitted 02/29/2016 for operative treatment of lumbar stenosis. Patient failed conservative treatments (please see the history and physical for the specifics) and had severe unremitting pain that affects sleep, daily activities and work/hobbies. After pre-op clearance, the patient was taken to the operating room on 02/29/2016 and underwent  Procedure(s): Left L4-5 Lateral Recess Decompression, Removal Extradural Intraspinal Facet Cyst.    Patient was given perioperative antibiotics:  Anti-infectives    Start     Dose/Rate Route Frequency Ordered Stop   03/01/16 0600  ceFAZolin (ANCEF) IVPB 2g/100 mL premix     2 g 200 mL/hr over 30 Minutes Intravenous On call to O.R. 02/29/16 1231 02/29/16 1442   02/29/16 2200  ceFAZolin (ANCEF) IVPB 1 g/50 mL premix     1 g 100 mL/hr over 30 Minutes Intravenous Every 8 hours 02/29/16 1743 03/01/16 0649   02/29/16 1800  elvitegravir-cobicistat-emtricitabine-tenofovir (GENVOYA) 150-150-200-10 MG tablet 1 tablet  Status:  Discontinued     1 tablet Oral Daily with breakfast 02/29/16 1743 03/01/16 1655       Patient was given sequential compression devices and early ambulation to prevent DVT.   Patient  benefited maximally from hospital stay and there were no complications. At the time of discharge, the patient was urinating/moving their bowels without difficulty, tolerating a regular diet, pain is controlled with oral pain medications and they have been cleared by PT/OT.   Recent vital signs: No data found.    Recent laboratory studies: No results for input(s): WBC, HGB, HCT, PLT, NA, K, CL, CO2, BUN, CREATININE, GLUCOSE, INR, CALCIUM in the last 72 hours.  Invalid input(s): PT, 2   Discharge Medications:     Medication List    STOP taking these medications        allopurinol 100 MG tablet   Commonly known as:  ZYLOPRIM     ANORO ELLIPTA 62.5-25 MCG/INH Aepb  Generic drug:  umeclidinium-vilanterol     atorvastatin 20 MG tablet  Commonly known as:  LIPITOR     Insulin Glargine 100 UNIT/ML Solostar Pen  Commonly known as:  LANTUS SOLOSTAR     tiZANidine 4 MG tablet  Commonly known as:  ZANAFLEX     traMADol 50 MG tablet  Commonly known as:  ULTRAM      TAKE these medications        Acetaminophen-Aspirin Buffered 250-250 MG tablet  Take 1 tablet by mouth every 4 (four) hours as needed for fever.     aspirin 81 MG tablet  Take 81 mg by mouth daily.     B-D UF III MINI PEN NEEDLES 31G X 5 MM Misc  Generic drug:  Insulin Pen Needle  Use with insulin pen four times daily. Dx: E11.9     carvedilol 25 MG tablet  Commonly known as:  COREG  Take 1 tablet (25 mg total) by mouth 2 (two) times daily with a meal.     colchicine 0.6 MG tablet  Take 0.6 mg by mouth daily as needed.     elvitegravir-cobicistat-emtricitabine-tenofovir 150-150-200-10 MG Tabs tablet  Commonly known as:  GENVOYA  TAKE 1 TABLET BY MOUTH DAILY WITH BREAKFAST.     gabapentin 300 MG capsule  Commonly known as:  NEURONTIN  Take 300 mg by mouth 3 (three) times daily.     GLUCOSAMINE CHONDR 1500 COMPLX PO  Take 2 tablets by mouth daily.     glucose blood test strip  Check BS tice daily as instructed.     hydrALAZINE 25 MG tablet  Commonly known as:  APRESOLINE  Take 1 tablet (25 mg total) by mouth 3 (three) times daily.     insulin lispro 100 UNIT/ML KiwkPen  Commonly known as:  HUMALOG KWIKPEN  Inject 8 units subcutaneous at meals to control blood sugar     isosorbide mononitrate 30 MG 24 hr tablet  Commonly known as:  IMDUR  Take 30 mg by mouth daily.     latanoprost 0.005 % ophthalmic solution  Commonly known as:  XALATAN  Place 1 drop into both eyes at bedtime.     Naftifine HCl 2 % Crea  Apply to affected area two times a day     nitroGLYCERIN 0.3 MG SL tablet  Commonly  known as:  NITROSTAT  Place 1 tablet (0.3 mg total) under the tongue as needed for chest pain (not to exceed 3 tablets in one day.).     ONE TOUCH LANCETS Misc  Check BS twice daily.     oxyCODONE-acetaminophen 5-325 MG tablet  Commonly known as:  PERCOCET/ROXICET  Take 1-2 tablets by mouth every 4 (four) hours as needed for moderate pain.  Diagnostic Studies: Dg Lumbar Spine 1 View  Mar 07, 2016  CLINICAL DATA:  L4-L5 decompression, localization film EXAM: LUMBAR SPINE - 1 VIEW COMPARISON:  12/29/2015 FINDINGS: Single lateral view of the lumbar spine submitted. There is a posterior localization needle at the level of L4-L5 disc space. Disc space flattening with vacuum disc phenomenon mild anterior spurring at L5-S1 level. IMPRESSION: Posterior localization needle at L4-L5 disc space level. Disc space flattening with vacuum disc phenomenon at L5-S1 level. Electronically Signed   By: Lahoma Crocker M.D.   On: 2016/03/07 17:12          Follow-up Information    Follow up with YATES,MARK C, MD In 1 week.   Specialty:  Orthopedic Surgery   Contact information:   [place] [address] [phone]       Discharge Plan:  discharge to home  Disposition:     Signed: Lanae Crumbly for Dr Rodell Perna [facility] orthopedics [phone] 03/25/2016, 6:04 PM

## 2016-03-30 ENCOUNTER — Telehealth: Payer: Self-pay | Admitting: *Deleted

## 2016-03-30 DIAGNOSIS — E0865 Diabetes mellitus due to underlying condition with hyperglycemia: Secondary | ICD-10-CM

## 2016-03-30 DIAGNOSIS — E0869 Diabetes mellitus due to underlying condition with other specified complication: Principal | ICD-10-CM

## 2016-03-30 DIAGNOSIS — IMO0002 Reserved for concepts with insufficient information to code with codable children: Secondary | ICD-10-CM

## 2016-03-30 MED ORDER — INSULIN GLARGINE 100 UNIT/ML SOLOSTAR PEN: 70.0000 [IU] | PEN_INJECTOR | Freq: Every day | SUBCUTANEOUS | Status: DC

## 2016-03-30 NOTE — Telephone Encounter (Signed)
Patient called and stated that his blood sugar has been staying high. Wants to know if it could be coming from the pain from the surgery. Is currently taking Lantus 60units and stated that when it is high he gives himself an extra 5 units. I scheduled an appointment with Tivis Ringer to follow up his blood sugars for Monday 04/04/16. Patient agreed.   03/15/16- 310 03/17/16- 238 03/18/16- 400 03/19/16- 279 03/20/16- 281 03/21/16- 296 03/22/16- 302 03/23/16- 315 03/24/16- 240 03/25/16- 233 03/26/16- 293 03/27/16- 301 03/28/16- 319 03/29/16- 365 03/30/16- 328

## 2016-03-30 NOTE — Telephone Encounter (Signed)
Patient notified and agreed. Wanted Rx faxed to pharmacy. Confirmed Monday's appointment with Cathey.

## 2016-03-30 NOTE — Telephone Encounter (Signed)
Pt states he pulled the callous off his toe like he usually does and it is bleeding, he is putting H2O2 and Naftin on it.  I called pt and instructed him to use soap and water cleansing, pat dry and appt Neosporin or Triple antibiotic ointment and a bandaid to the area until his appt.  Transferred to Schedulers to get an appt for 04/01/2016.

## 2016-03-30 NOTE — Telephone Encounter (Signed)
Noted. Increase lantus to 70 units and f/u with Cathey next week

## 2016-03-31 ENCOUNTER — Ambulatory Visit (INDEPENDENT_AMBULATORY_CARE_PROVIDER_SITE_OTHER): Payer: Medicare Other | Admitting: Podiatry

## 2016-03-31 ENCOUNTER — Encounter: Payer: Self-pay | Admitting: Podiatry

## 2016-03-31 DIAGNOSIS — E119 Type 2 diabetes mellitus without complications: Secondary | ICD-10-CM

## 2016-03-31 DIAGNOSIS — Q828 Other specified congenital malformations of skin: Secondary | ICD-10-CM

## 2016-03-31 NOTE — Progress Notes (Signed)
Subjective:     [name], male   DOB: December 05, 1950, 65 y.o.   MRN: [id number]  HPI patient states he was concerned about his lesions that he might of cause trauma to them and he wanted to have it checked as he is a diabetic and has had history of ulcers   Review of Systems     Objective:   Physical Exam Neurovascular status unchanged with keratotic lesion first hallux right and sub-fifth metatarsal right sub-fifth metatarsal left with slight drainage around the hallux right localized with no proximal edema erythema or drainage noted    Assessment:     Lesions that were secondary to trauma trauma with slight trauma right hallux that's localized    Plan:     Debridement lesions with no iatrogenic bleeding noted and reappoint to recheck

## 2016-04-04 ENCOUNTER — Encounter: Payer: Self-pay | Admitting: Pharmacotherapy

## 2016-04-04 ENCOUNTER — Ambulatory Visit (INDEPENDENT_AMBULATORY_CARE_PROVIDER_SITE_OTHER): Payer: Medicare Other | Admitting: Pharmacotherapy

## 2016-04-04 VITALS — BP 122/76 | HR 72 | Temp 98.2°F | Ht 74.0 in | Wt 190.0 lb

## 2016-04-04 DIAGNOSIS — Z794 Long term (current) use of insulin: Secondary | ICD-10-CM | POA: Diagnosis not present

## 2016-04-04 DIAGNOSIS — E1140 Type 2 diabetes mellitus with diabetic neuropathy, unspecified: Secondary | ICD-10-CM | POA: Diagnosis not present

## 2016-04-04 DIAGNOSIS — E0869 Diabetes mellitus due to underlying condition with other specified complication: Secondary | ICD-10-CM | POA: Diagnosis not present

## 2016-04-04 DIAGNOSIS — E0865 Diabetes mellitus due to underlying condition with hyperglycemia: Secondary | ICD-10-CM

## 2016-04-04 DIAGNOSIS — I10 Essential (primary) hypertension: Secondary | ICD-10-CM

## 2016-04-04 DIAGNOSIS — IMO0002 Reserved for concepts with insufficient information to code with codable children: Secondary | ICD-10-CM

## 2016-04-04 NOTE — Progress Notes (Signed)
  Subjective:    Jacob [name] is a 65 y.o.African American male who presents for follow-up of Type 2 diabetes mellitus.   Has had a significant problem with back pain.  Dr. Lorin Mercy put him on oxycodone.   Back pain much improved. Having constipation, drinking prune juice in large volumes.  Average BG:  321m/dl  Lantus 60 units daily Humalog 8 units with each meal.  No problems with vision Complains of polyuria and nocturia. Has always been drinking a lot of water. Denies polyphagia. His feet are throbbing. No peripheral edema.  No CP or SOB  Not much exercise due to pain. Usually, food choices are good. Having some "hot flashes"   Review of Systems A comprehensive review of systems was negative except for: Genitourinary: positive for nocturia Musculoskeletal: positive for feet are throbbing Endocrine: positive for diabetic symptoms including polyuria    Objective:    BP 122/76 mmHg  Pulse 72  Temp(Src) 98.2 F (36.8 C) (Oral)  Ht 6' 2"$  (1.88 m)  Wt 190 lb (86.183 kg)  BMI 24.38 kg/m2  SpO2 97%  General:  alert, cooperative and no distress  Oropharynx: normal findings: lips normal without lesions and gums healthy   Eyes:  negative findings: lids and lashes normal and conjunctivae and sclerae normal   Ears:  external ears normal        Lung: clear to auscultation bilaterally  Heart:  regular rate and rhythm     Extremities: extremities normal, atraumatic, no cyanosis or edema  Skin: dry     Neuro: mental status, speech normal, alert and oriented x3 and gait and station normal   Lab Review GLUCOSE (mg/dL)  Date Value  11/16/2015 168*  10/30/2015 215*  08/07/2015 125*   GLUCOSE, BLD (mg/dL)  Date Value  03/01/2016 127*  02/29/2016 156*  02/25/2016 208*   CO2 (mmol/L)  Date Value  03/01/2016 21*  02/29/2016 20*  02/25/2016 19*   BUN (mg/dL)  Date Value  03/01/2016 23*  02/29/2016 22*  02/25/2016 32*  11/16/2015 18  10/30/2015 61*   08/07/2015 18   CREAT (mg/dL)  Date Value  12/29/2015 1.96*  06/30/2015 1.49*  11/25/2014 1.54*   CREATININE, SER (mg/dL)  Date Value  03/01/2016 1.32*  02/29/2016 1.61*  02/25/2016 1.96*       Assessment:    Diabetes Mellitus type II, under fair control.  Has been having very high BG since back surgery. BP at goal <140/90   Plan:    1.  Rx changes: Increase Lantus to 70 units daily - split into 2 injections to imporve absorption. 2.  Increase Humalog to 12 units with each meal. 3.  Counseled on hypoglycemia. 4.  Exercise goal is 30-45 minutes 5 x week. 5.  Will recheck potassium level today per his request. 6.  BP at goal <140/90

## 2016-04-04 NOTE — Patient Instructions (Signed)
Increase Lantus 70 units daily.  Split this into 2 injections for absorption. Increase Humalog to 12 units with each meal.

## 2016-04-05 LAB — BASIC METABOLIC PANEL
BUN/Creatinine Ratio: 16 (ref 10–24)
BUN: 25 mg/dL (ref 8–27)
CO2: 21 mmol/L (ref 18–29)
Calcium: 9.1 mg/dL (ref 8.6–10.2)
Chloride: 99 mmol/L (ref 96–106)
Creatinine, Ser: 1.54 mg/dL — ABNORMAL HIGH (ref 0.76–1.27)
GFR calc Af Amer: 53 mL/min/{1.73_m2} — ABNORMAL LOW (ref >59)
GFR calc non Af Amer: 46 mL/min/{1.73_m2} — ABNORMAL LOW (ref >59)
Glucose: 324 mg/dL — ABNORMAL HIGH (ref 65–99)
Potassium: 5.1 mmol/L (ref 3.5–5.2)
Sodium: 135 mmol/L (ref 134–144)

## 2016-04-14 ENCOUNTER — Ambulatory Visit (INDEPENDENT_AMBULATORY_CARE_PROVIDER_SITE_OTHER): Payer: Medicare Other | Admitting: Internal Medicine

## 2016-04-14 ENCOUNTER — Encounter: Payer: Self-pay | Admitting: Internal Medicine

## 2016-04-14 VITALS — BP 138/70 | HR 65 | Temp 98.5°F | Ht 74.0 in | Wt 190.0 lb

## 2016-04-14 DIAGNOSIS — M5416 Radiculopathy, lumbar region: Secondary | ICD-10-CM | POA: Diagnosis not present

## 2016-04-14 DIAGNOSIS — Z720 Tobacco use: Secondary | ICD-10-CM

## 2016-04-14 DIAGNOSIS — I10 Essential (primary) hypertension: Secondary | ICD-10-CM

## 2016-04-14 DIAGNOSIS — Z794 Long term (current) use of insulin: Secondary | ICD-10-CM

## 2016-04-14 DIAGNOSIS — E785 Hyperlipidemia, unspecified: Secondary | ICD-10-CM | POA: Diagnosis not present

## 2016-04-14 DIAGNOSIS — B20 Human immunodeficiency virus [HIV] disease: Secondary | ICD-10-CM

## 2016-04-14 DIAGNOSIS — E1140 Type 2 diabetes mellitus with diabetic neuropathy, unspecified: Secondary | ICD-10-CM | POA: Diagnosis not present

## 2016-04-14 NOTE — Progress Notes (Signed)
Location:  [facility] clinic Provider: Denai Caba L. Mariea Clonts, D.O., C.M.D.  Goals of Care:  Advanced Directives 04/14/2016  Does patient have an advance directive? Yes  Type of Paramedic of [place];Living will  Copy of advanced directive(s) in chart? Yes    Chief Complaint  Patient presents with  . Medical Management of Chronic Issues    follow-up    HPI: Patient is a 65 y.o. male with h/o DMII with neuropathy, HIV, tobacco abuse, lumbar radiculopathy, chronic left hip pain, htn, CKD seen today for med mgt.  Last saw Dr. [name] in Feb who is his PCP.    HIV on HAART followed by Dr. Linward Natal in March.  Lipitor dose was reduced b/c of genvoya, pt reports.    HTN:  BP was 160/80 at first.  Had salmon patty, biscuit, applesauce.  Had a beer last night.  Smoked a cigarette when he was crossing the street.  DMII:  Has been seeing Tivis Ringer for his diabetes.  CBG 158 this am.  Pain was increasing his glucose at first into the 300s.  Neuropathy is bothersome at night.  He got new diabetic socks.  Last hba1c was 8.6 one month ago.    Smoking:  Still smoking.  Is down to 1/2ppd.  Is home t/r/s.  [facility]  Tried chantix in the past.  Insurance didn't want to pay for alternative treatments.  Just got through with back surgery May 8th.  Has gone back to a part time job at [employer] since 6/17.  Only was hospitalized one night.  Left hip is better also.  He has been increasing his gabapentin on his own sometimes to 4 from 3 when pain was worse.    Past Medical History  Diagnosis Date  . Syphilis, unspecified   . Human immunodeficiency virus (HIV) disease ([place])     takes Genvoya daily  . Gout, unspecified     takes Allopurinol daily as well as Colchicine-if needed  . Hyperlipidemia     takes Atorvastatin daily  . Ischemic cardiomyopathy   . Drug abuse     hx; tested for cocaine as recently as 2/08. says he is not using  drugs now - avoided defib. for this reason   . Chronic kidney disease, unspecified ([place])   . Arthritis   . Muscle spasm     takes Zanaflex as needed  . HTN (hypertension)     takes Coreg,Imdur.and Apresoline daily  . Coronary atherosclerosis of native coronary artery 2005    s/p surgery  . Coronary atherosclerosis of artery bypass graft   . CHF (congestive heart failure) (HCC)     not on any meds  . Shortness of breath dyspnea     rarely but if notices it then with exertion  . Pneumonia     hx of  . Peripheral neuropathy (HCC)     takes gabapentin daily  . Chronic back pain     stenosis  . GERD (gastroesophageal reflux disease)     takes OTC meds as needed  . Hepatitis 1967    Hep C  . History of colon polyps     benign  . Nocturia   . Type II or unspecified type diabetes mellitus without mention of complication, not stated as uncontrolled 2004    Lantus daily.Average fasting blood sugar 125-199  . Cataracts, bilateral     immature  . Glaucoma  uses eye drops daily    Past Surgical History  Procedure Laterality Date  . Left intertrochanteric hip fracture  2004    s/p intermedullary nail placement 2/08  . Left transverse mandibular fracture  10/05  . Laproscopic cholecystectomy  8/07  . Colonoscopy  2013    Dr.John Henrene Pastor   . Coronary artery bypass graft    . Lumbar laminectomy/decompression microdiscectomy N/A 02/29/2016    Procedure: Left L4-5 Lateral Recess Decompression, Removal Extradural Intraspinal Facet Cyst;  Surgeon: Marybelle Killings, MD;  Location: [facility];  Service: Orthopedics;  Laterality: N/A;    Allergies  Allergen Reactions  . Amphetamines Other (See Comments)    Pt is unaware of this -       Medication List       This list is accurate as of: 04/14/16 10:45 AM.  Always use your most recent med list.               allopurinol 100 MG tablet  Commonly known as:  ZYLOPRIM  Take 1 tablet (100 mg total) by mouth daily.     aspirin 81 MG tablet    Take 81 mg by mouth daily. Reported on 04/04/2016     atorvastatin 20 MG tablet  Commonly known as:  LIPITOR  Take 1 tablet (20 mg total) by mouth daily.     B-D UF III MINI PEN NEEDLES 31G X 5 MM Misc  Generic drug:  Insulin Pen Needle  Use with insulin pen four times daily. Dx: E11.9     carvedilol 25 MG tablet  Commonly known as:  COREG  Take 1 tablet (25 mg total) by mouth 2 (two) times daily with a meal.     colchicine 0.6 MG tablet  Take 0.6 mg by mouth daily as needed. Reported on 04/04/2016     elvitegravir-cobicistat-emtricitabine-tenofovir 150-150-200-10 MG Tabs tablet  Commonly known as:  GENVOYA  TAKE 1 TABLET BY MOUTH DAILY WITH BREAKFAST.     gabapentin 300 MG capsule  Commonly known as:  NEURONTIN  Take 300 mg by mouth 3 (three) times daily. Reported on 04/04/2016     GLUCOSAMINE CHONDR 1500 COMPLX PO  Take 2 tablets by mouth daily. Reported on 04/04/2016     glucose blood test strip  Check BS tice daily as instructed.     hydrALAZINE 25 MG tablet  Commonly known as:  APRESOLINE  Take 1 tablet (25 mg total) by mouth 3 (three) times daily.     Insulin Glargine 100 UNIT/ML Solostar Pen  Commonly known as:  LANTUS SOLOSTAR  Inject 70 Units into the skin daily. E11.22     insulin lispro 100 UNIT/ML injection  Commonly known as:  HUMALOG  Inject 12 Units into the skin 3 (three) times daily before meals.     isosorbide mononitrate 30 MG 24 hr tablet  Commonly known as:  IMDUR  Take 30 mg by mouth daily. Reported on 04/04/2016     latanoprost 0.005 % ophthalmic solution  Commonly known as:  XALATAN  Place 1 drop into both eyes at bedtime. Reported on 04/04/2016     Naftifine HCl 2 % Crea  Apply to affected area two times a day     nitroGLYCERIN 0.3 MG SL tablet  Commonly known as:  NITROSTAT  Place 1 tablet (0.3 mg total) under the tongue as needed for chest pain (not to exceed 3 tablets in one day.).     ONE TOUCH LANCETS Misc  Check BS  twice daily.      umeclidinium-vilanterol 62.5-25 MCG/INH Aepb  Commonly known as:  ANORO ELLIPTA  Inhale 1 puff into the lungs daily.        Review of Systems:  Review of Systems  Constitutional: Negative for fever and chills.  HENT: Negative for congestion.   Eyes: Negative for blurred vision.  Respiratory: Negative for shortness of breath.   Cardiovascular: Negative for chest pain.  Gastrointestinal: Negative for abdominal pain, constipation, blood in stool and melena.  Genitourinary: Negative for dysuria.  Musculoskeletal: Positive for back pain.  Skin: Negative for rash.  Neurological: Positive for tingling.  Psychiatric/Behavioral: Negative for depression and memory loss.    Health Maintenance  Topic Date Due  . PNA vac Low Risk Adult (1 of 2 - PCV13) 01/24/2014  . INFLUENZA VACCINE  05/24/2016  . HEMOGLOBIN A1C  05/27/2016  . FOOT EXAM  08/06/2016  . OPHTHALMOLOGY EXAM  12/14/2016  . LIPID PANEL  12/28/2016  . TETANUS/TDAP  07/27/2021  . COLONOSCOPY  10/01/2022  . ZOSTAVAX  Completed  . Hepatitis C Screening  Completed    Physical Exam: Filed Vitals:   04/14/16 1015  BP: 160/80  Pulse: 65  Temp: 98.5 F (36.9 C)  TempSrc: Oral  Height: 6\' 2"  (1.88 m)  Weight: 190 lb (86.183 kg)  SpO2: 98%   Body mass index is 24.38 kg/(m^2). Physical Exam  Constitutional: He is oriented to person, place, and time. He appears well-developed and well-nourished. No distress.  Eyes:  glasses  Cardiovascular: Normal rate, regular rhythm, normal heart sounds and intact distal pulses.   Pulmonary/Chest: Effort normal and breath sounds normal. No respiratory distress.  Abdominal: Soft. Bowel sounds are normal. He exhibits no distension. There is no tenderness.  Musculoskeletal: Normal range of motion. He exhibits no edema or tenderness.  Neurological: He is alert and oriented to person, place, and time.  Skin: Skin is warm and dry.  Psychiatric: He has a normal mood and affect. His behavior  is normal. Judgment and thought content normal.    Labs reviewed: Basic Metabolic Panel:  Recent Labs  08/07/15 1041  02/29/16 1654 03/01/16 0612 04/04/16 1126  NA 138  < > 137 136 135  K 4.2  < > 5.0 5.0 5.1  CL 102  < > 110 105 99  CO2 21  < > 20* 21* 21  GLUCOSE 125*  < > 156* 127* 324*  BUN 18  < > 22* 23* 25  CREATININE 1.38*  < > 1.61* 1.32* 1.54*  CALCIUM 9.4  < > 8.6* 8.7* 9.1  TSH 0.691  --   --   --   --   < > = values in this interval not displayed. Liver Function Tests:  Recent Labs  11/04/15 0508 12/29/15 0927 02/25/16 1034  AST 68* 22 28  ALT 79* 20 25  ALKPHOS 59 81 74  BILITOT 0.8 0.4 0.3  PROT 6.4* 7.0 7.2  ALBUMIN 2.7* 3.4* 3.2*    Recent Labs  11/03/15 1125  LIPASE 77*   No results for input(s): AMMONIA in the last 8760 hours. CBC:  Recent Labs  08/07/15 1041 11/03/15 1125  12/29/15 0927 02/25/16 1034 03/01/16 0612  WBC 6.0 9.3  < > 5.7 5.5 8.1  NEUTROABS 2.4 5.6  --   --   --   --   HGB  --  15.6  < > 12.9* 14.0 14.0  HCT 41.9 44.0  < > 38.4* 41.4 41.5  MCV  97 91.7  < > 96.7 94.5 96.3  PLT 157 175  < > 176 152 152  < > = values in this interval not displayed. Lipid Panel:  Recent Labs  08/07/15 1041 12/29/15 0927  CHOL 121 110*  HDL 32* 23*  LDLCALC 15 NOT CALC  TRIG 371* 564*  CHOLHDL 3.8 4.8   Lab Results  Component Value Date   HGBA1C 8.6* 02/25/2016   Assessment/Plan 1. Type 2 diabetes mellitus with diabetic neuropathy, with long-term current use of insulin (HCC) - control was poor in context of increased pain by report - sugars doing better when he checks at home - cont lantus with humalog meal coverage, cont asa, statin, is on hydralazine/nitrates instead of ace/arb - Hemoglobin A1c; Future - Lipid panel; Future - Basic metabolic panel; Future  2. Essential hypertension -bp initially elevated but improved to 138/70 -cont hydralazine, imdur, coreg  3. Lumbar radiculopathy -not on opioid medications (h/o  polysubstance abuse), does use gabapentin for neuropathic pain  4. Tobacco abuse -continues to smoke, says he might get some nicotine gum to try if he has enough in his flex acct  5. Human immunodeficiency virus (HIV) disease ([place]) -continue on his genvoya and f/u with ID  6. HLD (hyperlipidemia) - cont lipitor therapy, encouraged healthy diet and exercise - Lipid panel; Future  Labs/tests ordered:   Orders Placed This Encounter  Procedures  . Hemoglobin A1c    Standing Status: Future     Number of Occurrences:      Standing Expiration Date: 10/14/2016  . Lipid panel    Standing Status: Future     Number of Occurrences:      Standing Expiration Date: 10/14/2016    Order Specific Question:  Has the patient fasted?    Answer:  Yes  . Basic metabolic panel    Standing Status: Future     Number of Occurrences:      Standing Expiration Date: 10/14/2016    Order Specific Question:  Has the patient fasted?    Answer:  Yes    Next appt:  05/16/2016  Jarret Torre L. Shalik Sanfilippo, D.O. [facility] [address],  [address] Cell Phone (Mon-Fri 8am-5pm):  [phone] On Call:  [phone] & follow prompts after 5pm & weekends Office Phone:  [phone] Office Fax:  [phone]

## 2016-04-25 ENCOUNTER — Other Ambulatory Visit: Payer: Self-pay

## 2016-04-25 MED ORDER — INSULIN LISPRO 100 UNIT/ML (KWIKPEN): 12.0000 [IU] | PEN_INJECTOR | Freq: Three times a day (TID) | SUBCUTANEOUS | Status: DC

## 2016-04-25 MED ORDER — INSULIN LISPRO 100 UNIT/ML ~~LOC~~ SOLN: 12.0000 [IU] | Freq: Three times a day (TID) | SUBCUTANEOUS | Status: DC

## 2016-04-25 NOTE — Addendum Note (Signed)
Addended by: Logan Bores on: 04/25/2016 08:50 AM   Modules accepted: Orders, Medications

## 2016-04-25 NOTE — Telephone Encounter (Signed)
Patient was calling to make sure humalog states 12 units vs 8 units. Medication list was up to date, new rx sent to pharmacy with note to discard any refills that have 8 units.

## 2016-05-10 ENCOUNTER — Ambulatory Visit: Payer: Medicare Other

## 2016-05-16 ENCOUNTER — Ambulatory Visit (INDEPENDENT_AMBULATORY_CARE_PROVIDER_SITE_OTHER): Payer: Medicare Other | Admitting: Pharmacotherapy

## 2016-05-16 ENCOUNTER — Encounter: Payer: Self-pay | Admitting: Pharmacotherapy

## 2016-05-16 VITALS — BP 132/82 | HR 62 | Temp 97.9°F | Resp 16 | Ht 74.0 in | Wt 195.0 lb

## 2016-05-16 DIAGNOSIS — Z794 Long term (current) use of insulin: Secondary | ICD-10-CM

## 2016-05-16 DIAGNOSIS — E1140 Type 2 diabetes mellitus with diabetic neuropathy, unspecified: Secondary | ICD-10-CM | POA: Diagnosis not present

## 2016-05-16 DIAGNOSIS — I10 Essential (primary) hypertension: Secondary | ICD-10-CM

## 2016-05-16 NOTE — Progress Notes (Signed)
  Subjective:    Jacob [name] is a 65 y.o.African American male who presents for follow-up of Type 2 diabetes mellitus.   Last A1C was 8.6% (02/25/16)  Fasting BG:  82-154 Post-prandial:  100-203 No hypoglycemia.  Working part-time now [employer]) Not making healthy food choices. No exercise exercise. Denies problems with vision.  Left eye a little painful.  Has eye exam 05/19/16. To see podiatrist on 05/24/16 for callus buildup. Denies other problems with feet. Some peripheral edema. Nocturia 2-3 times per night. No dysuria. Staying well hydrated.  Current Lantus dose 70 units (split doses) Current Humalog 12 units with each meal.  May increase to 15 units if he is higher.   Review of Systems A comprehensive review of systems was negative except for: Eyes: positive for contacts/glasses and irritation Cardiovascular: positive for lower extremity edema Genitourinary: positive for nocturia Musculoskeletal: positive for arthralgias    Objective:    BP 132/82   Pulse 62   Temp 97.9 F (36.6 C) (Oral)   Resp 16   Ht 6\' 2"  (1.88 m)   Wt 195 lb (88.5 kg)   SpO2 97%   BMI 25.04 kg/m   General:  alert, cooperative and no distress  Oropharynx: normal findings: lips normal without lesions and gums healthy   Eyes:  negative findings: lids and lashes normal and conjunctivae and sclerae normal   Ears:  external ears normal        Lung: clear to auscultation bilaterally  Heart:  regular rate and rhythm     Extremities: edema trace bilaterally  Skin: warm and dry, no hyperpigmentation, vitiligo, or suspicious lesions     Neuro: mental status, speech normal, alert and oriented x3 and gait and station normal   Lab Review Glucose (mg/dL)  Date Value  04/04/2016 324 (H)   Glucose, Bld (mg/dL)  Date Value  03/01/2016 127 (H)  02/29/2016 156 (H)  02/25/2016 208 (H)   CO2 (mmol/L)  Date Value  04/04/2016 21  03/01/2016 21 (L)  02/29/2016 20 (L)   BUN (mg/dL)  Date Value   04/04/2016 25  03/01/2016 23 (H)  02/29/2016 22 (H)  02/25/2016 32 (H)  11/16/2015 18  10/30/2015 61 (H)   Creat (mg/dL)  Date Value  12/29/2015 1.96 (H)  06/30/2015 1.49 (H)  11/25/2014 1.54 (H)   Creatinine, Ser (mg/dL)  Date Value  04/04/2016 1.54 (H)  03/01/2016 1.32 (H)  02/29/2016 1.61 (H)       Assessment:    Diabetes Mellitus type II, under good control.  [facility] records indicate good control.   BP at goal <140/90   Plan:    1.  Rx changes: none  2.  Continue Lantus 70 units daily (split into 2 doses for absorption). 3.  Continue Humalog 12 units with each meal. 4.  Counseled on nutrition goals. 5.  Exercise goal is 30-45 minutes 5 x week. 6.  BP at goal <140/90.

## 2016-05-16 NOTE — Patient Instructions (Signed)
Keep up the good work

## 2016-05-23 ENCOUNTER — Other Ambulatory Visit: Payer: Self-pay | Admitting: Internal Medicine

## 2016-05-23 ENCOUNTER — Other Ambulatory Visit: Payer: Self-pay | Admitting: Cardiovascular Disease

## 2016-05-23 DIAGNOSIS — E119 Type 2 diabetes mellitus without complications: Secondary | ICD-10-CM

## 2016-05-24 ENCOUNTER — Encounter: Payer: Self-pay | Admitting: Podiatry

## 2016-05-24 ENCOUNTER — Ambulatory Visit (INDEPENDENT_AMBULATORY_CARE_PROVIDER_SITE_OTHER): Payer: Medicare Other | Admitting: Podiatry

## 2016-05-24 DIAGNOSIS — Q828 Other specified congenital malformations of skin: Secondary | ICD-10-CM | POA: Diagnosis not present

## 2016-05-24 DIAGNOSIS — E119 Type 2 diabetes mellitus without complications: Secondary | ICD-10-CM

## 2016-05-24 DIAGNOSIS — M79676 Pain in unspecified toe(s): Secondary | ICD-10-CM | POA: Diagnosis not present

## 2016-05-24 DIAGNOSIS — B351 Tinea unguium: Secondary | ICD-10-CM

## 2016-05-24 NOTE — Progress Notes (Signed)
[name], male   DOB: 1951-08-17, 65 y.o.   MRN: [id number] Complaint:  Visit Type: Patient returns to my office for continued preventative foot care services. Complaint: Patient states" my nails have grown long and thick and become painful to walk and wear shoes" Patient has been diagnosed with DM with no foot complications. The patient presents for preventative foot care services. No changes to ROS  Podiatric Exam: Vascular: dorsalis pedis and posterior tibial pulses are palpable bilateral. Capillary return is immediate. Temperature gradient is WNL. Skin turgor WNL  Sensorium: Diminished  Semmes Weinstein monofilament test. Normal tactile sensation bilaterally. Nail Exam: Pt has thick disfigured discolored nails with subungual debris noted bilateral entire nail hallux through fifth toenails Ulcer Exam: There is no evidence of ulcer or pre-ulcerative changes or infection. Orthopedic Exam: Muscle tone and strength are WNL. No limitations in general ROM. No crepitus or effusions noted. Foot type and digits show no abnormalities. Severe HAV  B/L. Skin: No Porokeratosis. No infection or ulcers. Callus sub 5th B/L Callus.  Pinch callus B/L  Diagnosis:  Onychomycosis, , Pain in right toe, pain in left toes,Porokeratosos B/L  Treatment & Plan Procedures and Treatment: Consent by patient was obtained for treatment procedures. The patient understood the discussion of treatment and procedures well. All questions were answered thoroughly reviewed. Debridement of mycotic and hypertrophic toenails, 1 through 5 bilateral and clearing of subungual debris. No ulceration, no infection noted. D.ebride callus B/L Return Visit-Office Procedure: Patient instructed to return to the office for a follow up visit 10 weeks for continued evaluation and treatment.    Gardiner Barefoot DPM

## 2016-05-24 NOTE — Progress Notes (Signed)
[name], male   DOB: 1951-09-24, 65 y.o.   MRN: [id number]    Cardiology Office Note   Date:  05/26/2016   ID:  Jacob [name], DOB December 10, 1950, MRN [id number]  PCP:  Jacob Cranker, DO  Cardiologist:  Dr. Jenkins Rouge   Electrophysiologist:  Dr. Virl Axe   Chief Complaint  Patient presents with  . Cardiomyopathy    no sx     History of Present Illness: Jacob [name] is a 65 y.o. male with a hx of CAD, s/p CABG 2004, ischemic cardiomyopathy, HTN, HL, HIV, hepatitis C, CKD. Previously followed by Dr. Verl [name]. Established with me in  10/2013. Cardiac MRI demonstrated EF < 35%. He was referred to Dr. Virl Axe for possible AICD. The patient ultimately declined implantation of ICD. His father died at [name] after implant of pneumonia   Seen in ED 3/12 with chest pain.  Symptoms were felt to be stable and outpatient workup recommended.  ED note indicates consult with Dr. Domenic Polite.  However, I cannot find a note from Dr. Domenic Polite in the chart (? Phone call).  He returns for FU.  The patient tells me that he's had left and right shoulder pain over the past month. It does radiate across his upper chest. He mainly has it at rest. He denies exertional symptoms. He has mild shortness of breath. He is NYHA 2. His dyspnea is not any worse. He denies exertional chest pain or jaw pain. He denies orthopnea PND or edema. He denies syncope. He had a recent URI. This is improved.  F/U myovue non ischemic  Large inferior and antero apical /lateral scars EF 24%  01/20/15   Studies: - LHC (04/2005): D1 occluded, proximal circumflex 70%, ostial RCA occluded, ramus branch of the circumflex occluded; SVG-ramus patent, SVG-RCA patent - Echo (08/2013): Mild LVH, EF 35-40%, inferior hypokinesis, grade 1 diastolic dysfunction, mild BAE. - Nuclear (01/13/14): Inferior and anterolateral infarct, no ischemia, EF 30%; high-risk   Cardiac MRI 11/2013 1) Moderate LVE with LVH EF 31% with multiple  RWMAls as described above Consistent with multi vessel CAD 2) Full thickness scar involving the lateral and inferolateral walls 3) Mild LAE 4) Normal RA/RV  02/2016 Had back surgery with Dr Jacob [name] for L4-5 decompression and dural tear. No complications   Past Medical History:  Diagnosis Date  . Arthritis   . Cataracts, bilateral    immature  . CHF (congestive heart failure) (HCC)    not on any meds  . Chronic back pain    stenosis  . Chronic kidney disease, unspecified ([place])   . Coronary atherosclerosis of artery bypass graft   . Coronary atherosclerosis of native coronary artery 2005   s/p surgery  . Drug abuse    hx; tested for cocaine as recently as 2/08. says he is not using drugs now - avoided defib. for this reason   . GERD (gastroesophageal reflux disease)    takes OTC meds as needed  . Glaucoma    uses eye drops daily  . Gout, unspecified    takes Allopurinol daily as well as Colchicine-if needed  . Hepatitis 1967   Hep C  . History of colon polyps    benign  . HTN (hypertension)    takes Coreg,Imdur.and Apresoline daily  . Human immunodeficiency virus (HIV) disease ([place])    takes Genvoya daily  . Hyperlipidemia    takes Atorvastatin daily  . Ischemic cardiomyopathy   . Muscle spasm  takes Zanaflex as needed  . Nocturia   . Peripheral neuropathy (HCC)    takes gabapentin daily  . Pneumonia    hx of  . Shortness of breath dyspnea    rarely but if notices it then with exertion  . Syphilis, unspecified   . Type II or unspecified type diabetes mellitus without mention of complication, not stated as uncontrolled 2004   Lantus daily.Average fasting blood sugar 125-199    Past Surgical History:  Procedure Laterality Date  . COLONOSCOPY  2013   Dr.John Henrene [name]   . CORONARY ARTERY BYPASS GRAFT    . laproscopic cholecystectomy  8/07  . left intertrochanteric hip fracture  2004   s/p intermedullary nail placement 2/08  . left transverse mandibular fracture   10/05  . LUMBAR LAMINECTOMY/DECOMPRESSION MICRODISCECTOMY N/A 02/29/2016   Procedure: Left L4-5 Lateral Recess Decompression, Removal Extradural Intraspinal Facet Cyst;  Surgeon: [place] Killings, [name];  Location: [facility];  Service: Orthopedics;  Laterality: N/A;     Current Outpatient Prescriptions  Medication Sig Dispense Refill  . allopurinol (ZYLOPRIM) 100 MG tablet Take 1 tablet (100 mg total) by mouth daily. 30 tablet 3  . aspirin 81 MG tablet Take 81 mg by mouth daily. Reported on 04/04/2016    . atorvastatin (LIPITOR) 20 MG tablet Take 1 tablet (20 mg total) by mouth daily. 30 tablet 3  . B-D UF III MINI PEN NEEDLES 31G X 5 MM MISC USE 4 TIMES DAILY 150 each 11  . carvedilol (COREG) 25 MG tablet Take 1 tablet (25 mg total) by mouth 2 (two) times daily with a meal. 180 tablet 3  . colchicine 0.6 MG tablet Take 0.6 mg by mouth daily as needed. Reported on 04/04/2016    . elvitegravir-cobicistat-emtricitabine-tenofovir (GENVOYA) 150-150-200-10 MG TABS tablet TAKE 1 TABLET BY MOUTH DAILY WITH BREAKFAST. 30 tablet 11  . gabapentin (NEURONTIN) 300 MG capsule Take 300 mg by mouth 3 (three) times daily. Reported on 04/04/2016    . Glucosamine-Chondroit-Vit C-Mn (GLUCOSAMINE CHONDR 1500 COMPLX PO) Take 2 tablets by mouth daily. Reported on 04/04/2016    . glucose blood test strip Check BS tice daily as instructed. 100 each 3  . hydrALAZINE (APRESOLINE) 25 MG tablet Take 1 tablet (25 mg total) by mouth 3 (three) times daily. 90 tablet 6  . Insulin Glargine (LANTUS SOLOSTAR) 100 UNIT/ML Solostar Pen Inject 70 Units into the skin daily. E11.22 15 pen PRN  . insulin lispro (HUMALOG KWIKPEN) 100 UNIT/ML KiwkPen Inject 0.12 mLs (12 Units total) into the skin 3 (three) times daily. Before Meals DX E11.22 15 mL 5  . isosorbide mononitrate (IMDUR) 30 MG 24 hr tablet Take 30 mg by mouth daily. Reported on 04/04/2016    . isosorbide mononitrate (IMDUR) 30 MG 24 hr tablet take 1 tablet by mouth once daily 90 tablet 3  .  latanoprost (XALATAN) 0.005 % ophthalmic solution Place 1 drop into both eyes at bedtime. Reported on 04/04/2016    . Naftifine HCl 2 % CREA Apply to affected area two times a day 1 Tube 11  . nitroGLYCERIN (NITROSTAT) 0.3 MG SL tablet Place 1 tablet (0.3 mg total) under the tongue as needed for chest pain (not to exceed 3 tablets in one day.). 25 tablet 2  . ONE TOUCH LANCETS MISC Check BS twice daily. 200 each 3  . umeclidinium-vilanterol (ANORO ELLIPTA) 62.5-25 MCG/INH AEPB Inhale 1 puff into the lungs daily. 14 each 3   No current facility-administered medications for this  visit.     Allergies:   Amphetamines    Social History:  The patient  reports that he has been smoking Cigarettes.  He has a 20.50 pack-year smoking history. He has never used smokeless tobacco. He reports that he drinks about 3.6 oz of alcohol per week . He reports that he does not use drugs.   Family History:  The patient's family history includes Alzheimer's disease in his mother and sister; Diabetes in his brother, brother, and sister; Drug abuse in his brother; Heart attack in his brother; Heart failure in his father; Hypertension in his brother and father; Stroke in his sister.    ROS:   Please see the history of present illness.   Review of Systems  Constitution: Positive for diaphoresis.  Cardiovascular: Positive for chest pain and dyspnea on exertion.  Respiratory: Positive for cough.   Musculoskeletal: Positive for myalgias.  All other systems reviewed and are negative.    PHYSICAL EXAM: VS:  BP 133/74 (BP Location: Right Arm, Patient Position: Sitting, Cuff Size: Normal)   Pulse 66   Ht 6\' 2"  (1.88 m)   Wt 194 lb 12.8 oz (88.4 kg)   SpO2 94%   BMI 25.01 kg/m     Wt Readings from Last 3 Encounters:  05/26/16 194 lb 12.8 oz (88.4 kg)  05/16/16 195 lb (88.5 kg)  04/14/16 190 lb (86.2 kg)     GEN: Well nourished, well developed, in no acute distress  HEENT: normal  Neck: no JVD, no  masses Cardiac:  Normal S1/S2, RRR; no murmur ,  no rubs or gallops, no edema  Respiratory:  clear to auscultation bilaterally, no wheezing, rhonchi or rales. GI: soft, nontender, nondistended, + BS MS: no deformity or atrophy  Skin: warm and dry  Neuro:  CNs II-XII intact, Strength and sensation are intact Psych: Normal affect   EKG:  March 2016    NSR, HR 81, inf Q waves, TWI in 1, aVL, V4-6, no changes   Recent Labs: 08/07/2015: TSH 0.691 02/25/2016: ALT 25 03/01/2016: Hemoglobin 14.0; Platelets 152 04/04/2016: BUN 25; Creatinine, Ser 1.54; Potassium 5.1; Sodium 135    Lipid Panel    Component Value Date/Time   CHOL 110 (L) 12/29/2015 0927   CHOL 121 08/07/2015 1041   TRIG 564 (H) 12/29/2015 0927   HDL 23 (L) 12/29/2015 0927   HDL 32 (L) 08/07/2015 1041   CHOLHDL 4.8 12/29/2015 0927   VLDL NOT CALC 12/29/2015 0927   LDLCALC NOT CALC 12/29/2015 0927   [place] 15 08/07/2015 1041      ASSESSMENT AND PLAN:  Other chest pain Atypical f/u myovue non ischemic continue medical Rx   Coronary artery disease involving native coronary artery of native heart without angina pectoris  Continue aspirin, statin, beta blocker, ACE inhibitor.  Ischemic cardiomyopathy He has refused ICD implantation past. Continue beta blocker, ACE inhibitor, hydralazine. We had a long discussion about maximizing his CHF medications. He does not take any PDE-5 inhibitors.Tolerating nitrates  Chronic combined systolic and diastolic congestive heart failure Volume stable. He is NYHA 2.  Essential hypertension Controlled.  HLD (hyperlipidemia)  Continue statin.  Ortho:  Post back surgery May 2017 f/u Dr Jacob [name] much relief   Current medicines are reviewed at length with the patient today.  The patient does not have concerns regarding medicines.  The following changes have been made:  As above   Labs/ tests ordered today include:   No orders of the defined types were placed in  this  encounter.   Disposition:   FU with me in 6 months    Jenkins Rouge

## 2016-05-26 ENCOUNTER — Encounter: Payer: Self-pay | Admitting: Cardiovascular Disease

## 2016-05-26 ENCOUNTER — Ambulatory Visit (INDEPENDENT_AMBULATORY_CARE_PROVIDER_SITE_OTHER): Payer: Medicare Other | Admitting: Cardiovascular Disease

## 2016-05-26 VITALS — BP 133/74 | HR 66 | Ht 74.0 in | Wt 194.8 lb

## 2016-05-26 DIAGNOSIS — I255 Ischemic cardiomyopathy: Secondary | ICD-10-CM | POA: Diagnosis not present

## 2016-05-26 NOTE — Patient Instructions (Signed)
Medication Instructions:  Your physician recommends that you continue on your current medications as directed. Please refer to the Current Medication list given to you today.  Labwork: NONE  Testing/Procedures: NONE  Follow-Up: Your physician wants you to follow-up in: 6 months with Dr. Nishan. You will receive a reminder letter in the mail two months in advance. If you don't receive a letter, please call our office to schedule the follow-up appointment.   If you need a refill on your cardiac medications before your next appointment, please call your pharmacy.    

## 2016-06-07 ENCOUNTER — Encounter: Payer: Self-pay | Admitting: Nurse Practitioner

## 2016-06-07 ENCOUNTER — Ambulatory Visit (INDEPENDENT_AMBULATORY_CARE_PROVIDER_SITE_OTHER): Payer: Medicare Other | Admitting: Nurse Practitioner

## 2016-06-07 ENCOUNTER — Ambulatory Visit
Admission: RE | Admit: 2016-06-07 | Discharge: 2016-06-07 | Disposition: A | Discharge status: Home / Self Care | Payer: Medicare Other | Source: Ambulatory Visit | Attending: Nurse Practitioner | Admitting: Nurse Practitioner

## 2016-06-07 VITALS — BP 138/72 | HR 87 | Temp 98.1°F | Resp 18 | Ht 74.0 in | Wt 196.2 lb

## 2016-06-07 DIAGNOSIS — K05219 Aggressive periodontitis, localized, unspecified severity: Secondary | ICD-10-CM | POA: Diagnosis not present

## 2016-06-07 DIAGNOSIS — R51 Headache: Secondary | ICD-10-CM | POA: Diagnosis not present

## 2016-06-07 MED ORDER — CLINDAMYCIN HCL 300 MG PO CAPS: 300.0000 mg | ORAL_CAPSULE | Freq: Three times a day (TID) | ORAL | 0 refills | Status: DC

## 2016-06-07 MED ORDER — SACCHAROMYCES BOULARDII 250 MG PO CAPS: 250.0000 mg | ORAL_CAPSULE | Freq: Two times a day (BID) | ORAL | 0 refills | Status: DC

## 2016-06-07 NOTE — Patient Instructions (Addendum)
Start Clindamycin which is an antibiotic-- to take 3 times daily To take probioitic-- florastor twice daily for 2 weeks  To get xray of jaw

## 2016-06-07 NOTE — Progress Notes (Signed)
Careteam: Patient Care Team: Gildardo Cranker, DO as PCP - General (Internal Medicine) [name], MD as PCP - Infectious Diseases (Infectious Diseases) [name], MD as Consulting Physician (Ophthalmology) [name], MD as Consulting Physician (Cardiology)  Advanced Directive information Does patient have an advance directive?: Yes, Type of Advance Directive: [place];Living will  Allergies  Allergen Reactions  . Amphetamines Other (See Comments)    Pt is unaware of this -     Chief Complaint  Patient presents with  . Medical Management of Chronic Issues    Left side jaw pain/swelling since Thursday. Pt worried due to having had broken jaw.   . Other    Pt has been using Biotene mouthwash and oral analqesic gel. Has dental appt on 06/21/16.      HPI: Patient is a 65 y.o. male seen in the office today due to left sided jaw swelling and pain. Hx of jaw fracture in the past. Left side of jaw is more puffy than right generally. Also have partials and sometimes these will irritate the gums and side of mouth.  Swelling started 1.5 weeks ago, called the dentist but they can not get him in until the 29th. Swelling has gotten better and not has sore. Started using biotene mouthwash/gel. No fever No trouble eating  Review of Systems:  Review of Systems  Constitutional: Negative for activity change, appetite change, chills, fatigue and fever.  HENT: Positive for dental problem and facial swelling. Negative for sore throat and trouble swallowing.     Past Medical History:  Diagnosis Date  . Arthritis   . Cataracts, bilateral    immature  . CHF (congestive heart failure) (HCC)    not on any meds  . Chronic back pain    stenosis  . Chronic kidney disease, unspecified ([place])   . Coronary atherosclerosis of artery bypass graft   . Coronary atherosclerosis of native coronary artery 2005   s/p surgery  . Drug abuse    hx; tested for cocaine as  recently as 2/08. says he is not using drugs now - avoided defib. for this reason   . GERD (gastroesophageal reflux disease)    takes OTC meds as needed  . Glaucoma    uses eye drops daily  . Gout, unspecified    takes Allopurinol daily as well as Colchicine-if needed  . Hepatitis 1967   Hep C  . History of colon polyps    benign  . HTN (hypertension)    takes Coreg,Imdur.and Apresoline daily  . Human immunodeficiency virus (HIV) disease ([place])    takes Genvoya daily  . Hyperlipidemia    takes Atorvastatin daily  . Ischemic cardiomyopathy   . Muscle spasm    takes Zanaflex as needed  . Nocturia   . Peripheral neuropathy (HCC)    takes gabapentin daily  . Pneumonia    hx of  . Shortness of breath dyspnea    rarely but if notices it then with exertion  . Syphilis, unspecified   . Type II or unspecified type diabetes mellitus without mention of complication, not stated as uncontrolled 2004   Lantus daily.Average fasting blood sugar 125-199   Past Surgical History:  Procedure Laterality Date  . COLONOSCOPY  2013   Dr.John Henrene Pastor   . CORONARY ARTERY BYPASS GRAFT    . laproscopic cholecystectomy  8/07  . left intertrochanteric hip fracture  2004   s/p intermedullary nail placement 2/08  . left transverse mandibular  fracture  10/05  . LUMBAR LAMINECTOMY/DECOMPRESSION MICRODISCECTOMY N/A 02/29/2016   Procedure: Left L4-5 Lateral Recess Decompression, Removal Extradural Intraspinal Facet Cyst;  Surgeon: Marybelle Killings, MD;  Location: [facility];  Service: Orthopedics;  Laterality: N/A;   Social History:   reports that he has been smoking Cigarettes.  He has a 20.50 pack-year smoking history. He has never used smokeless tobacco. He reports that he drinks about 3.6 oz of alcohol per week . He reports that he does not use drugs.  Family History  Problem Relation Age of Onset  . Heart failure Father   . Hypertension Father   . Diabetes Brother   . Heart attack Brother   . Alzheimer's  disease Mother   . Stroke Sister   . Diabetes Sister   . Alzheimer's disease Sister   . Hypertension Brother   . Diabetes Brother   . Drug abuse Brother   . Colon cancer Neg Hx     Medications: Patient's Medications  New Prescriptions   No medications on file  Previous Medications   ALLOPURINOL (ZYLOPRIM) 100 MG TABLET    Take 1 tablet (100 mg total) by mouth daily.   ASPIRIN 81 MG TABLET    Take 81 mg by mouth daily. Reported on 04/04/2016   ATORVASTATIN (LIPITOR) 20 MG TABLET    Take 1 tablet (20 mg total) by mouth daily.   B-D UF III MINI PEN NEEDLES 31G X 5 MM MISC    USE 4 TIMES DAILY   CARVEDILOL (COREG) 25 MG TABLET    Take 1 tablet (25 mg total) by mouth 2 (two) times daily with a meal.   COLCHICINE 0.6 MG TABLET    Take 0.6 mg by mouth daily as needed. Reported on 04/04/2016   ELVITEGRAVIR-COBICISTAT-EMTRICITABINE-TENOFOVIR (GENVOYA) 150-150-200-10 MG TABS TABLET    TAKE 1 TABLET BY MOUTH DAILY WITH BREAKFAST.   GABAPENTIN (NEURONTIN) 300 MG CAPSULE    Take 300 mg by mouth 3 (three) times daily. Reported on 04/04/2016   GLUCOSAMINE-CHONDROIT-VIT C-MN (GLUCOSAMINE CHONDR 1500 COMPLX PO)    Take 2 tablets by mouth daily. Reported on 04/04/2016   GLUCOSE BLOOD TEST STRIP    Check BS tice daily as instructed.   HYDRALAZINE (APRESOLINE) 25 MG TABLET    Take 1 tablet (25 mg total) by mouth 3 (three) times daily.   INSULIN GLARGINE (LANTUS SOLOSTAR) 100 UNIT/ML SOLOSTAR PEN    Inject 70 Units into the skin daily. E11.22   INSULIN LISPRO (HUMALOG KWIKPEN) 100 UNIT/ML KIWKPEN    Inject 0.12 mLs (12 Units total) into the skin 3 (three) times daily. Before Meals DX E11.22   ISOSORBIDE MONONITRATE (IMDUR) 30 MG 24 HR TABLET    Take 30 mg by mouth daily. Reported on 04/04/2016   ISOSORBIDE MONONITRATE (IMDUR) 30 MG 24 HR TABLET    take 1 tablet by mouth once daily   LATANOPROST (XALATAN) 0.005 % OPHTHALMIC SOLUTION    Place 1 drop into both eyes at bedtime. Reported on 04/04/2016   NAFTIFINE HCL  2 % CREA    Apply to affected area two times a day   NITROGLYCERIN (NITROSTAT) 0.3 MG SL TABLET    Place 1 tablet (0.3 mg total) under the tongue as needed for chest pain (not to exceed 3 tablets in one day.).   ONE TOUCH LANCETS MISC    Check BS twice daily.   UMECLIDINIUM-VILANTEROL (ANORO ELLIPTA) 62.5-25 MCG/INH AEPB    Inhale 1 puff into the lungs daily.  Modified Medications   No medications on file  Discontinued Medications   No medications on file     Physical Exam:  Vitals:   06/07/16 1523  BP: 138/72  Pulse: 87  Resp: 18  Temp: 98.1 F (36.7 C)  TempSrc: Oral  SpO2: 97%  Weight: 196 lb 3.2 oz (89 kg)  Height: 6\' 2"  (1.88 m)   Body mass index is 25.19 kg/m.  Physical Exam  Constitutional: He appears well-developed and well-nourished.  HENT:  Head: Normocephalic and atraumatic.  Mouth/Throat: Oropharynx is clear and moist. Abnormal dentition.  Right lower jaw swollen and slightly tender to touch Back lower gumline tender with small area of redness and small amount of purulent drainage noted    Labs reviewed: Basic Metabolic Panel:  Recent Labs  08/07/15 1041  02/29/16 1654 03/01/16 0612 04/04/16 1126  NA 138  < > 137 136 135  K 4.2  < > 5.0 5.0 5.1  CL 102  < > 110 105 99  CO2 21  < > 20* 21* 21  GLUCOSE 125*  < > 156* 127* 324*  BUN 18  < > 22* 23* 25  CREATININE 1.38*  < > 1.61* 1.32* 1.54*  CALCIUM 9.4  < > 8.6* 8.7* 9.1  TSH 0.691  --   --   --   --   < > = values in this interval not displayed. Liver Function Tests:  Recent Labs  11/04/15 0508 12/29/15 0927 02/25/16 1034  AST 68* 22 28  ALT 79* 20 25  ALKPHOS 59 81 74  BILITOT 0.8 0.4 0.3  PROT 6.4* 7.0 7.2  ALBUMIN 2.7* 3.4* 3.2*    Recent Labs  11/03/15 1125  LIPASE 77*   No results for input(s): AMMONIA in the last 8760 hours. CBC:  Recent Labs  08/07/15 1041 11/03/15 1125  12/29/15 0927 02/25/16 1034 03/01/16 0612  WBC 6.0 9.3  < > 5.7 5.5 8.1  NEUTROABS 2.4 5.6   --   --   --   --   HGB  --  15.6  < > 12.9* 14.0 14.0  HCT 41.9 44.0  < > 38.4* 41.4 41.5  MCV 97 91.7  < > 96.7 94.5 96.3  PLT 157 175  < > 176 152 152  < > = values in this interval not displayed. Lipid Panel:  Recent Labs  08/07/15 1041 12/29/15 0927  CHOL 121 110*  HDL 32* 23*  LDLCALC 15 NOT CALC  TRIG 371* 564*  CHOLHDL 3.8 4.8   TSH:  Recent Labs  08/07/15 1041  TSH 0.691   A1C: Lab Results  Component Value Date   HGBA1C 8.6 (H) 02/25/2016     Assessment/Plan 1. Gum abscess - clindamycin (CLEOCIN) 300 MG capsule; Take 1 capsule (300 mg total) by mouth 3 (three) times daily.  Dispense: 21 capsule; Refill: 0 - DG Mandible 4 Views; rule out osteo - saccharomyces boulardii (FLORASTOR) 250 MG capsule; Take 1 capsule (250 mg total) by mouth 2 (two) times daily.  Dispense: 30 capsule; Refill: 0 -to keep out partials and have dentist evaluate and correct fit -return precautions discussed   [name] K. Harle Battiest  [facility] [phone] 8 am - 5 pm) [phone] (after hours)

## 2016-06-10 NOTE — Addendum Note (Signed)
Addended by: Moshe Cipro Jamori Biggar A on: 06/10/2016 03:53 PM   Modules accepted: Orders

## 2016-06-10 NOTE — Addendum Note (Signed)
Addended by: Moshe Cipro Stacyann Mcconaughy A on: 06/10/2016 03:52 PM   Modules accepted: Orders

## 2016-06-10 NOTE — Addendum Note (Signed)
Addended by: Moshe Cipro Clarivel Callaway A on: 06/10/2016 03:52 PM   Modules accepted: Orders

## 2016-06-10 NOTE — Addendum Note (Signed)
Addended by: Moshe Cipro MESHELL A on: 06/10/2016 03:52 PM   Modules accepted: Orders

## 2016-06-14 DIAGNOSIS — H02051 Trichiasis without entropian right upper eyelid: Secondary | ICD-10-CM | POA: Diagnosis not present

## 2016-06-14 DIAGNOSIS — H401113 Primary open-angle glaucoma, right eye, severe stage: Secondary | ICD-10-CM | POA: Diagnosis not present

## 2016-06-14 DIAGNOSIS — H401123 Primary open-angle glaucoma, left eye, severe stage: Secondary | ICD-10-CM | POA: Diagnosis not present

## 2016-06-17 ENCOUNTER — Encounter: Payer: Self-pay | Admitting: Internal Medicine

## 2016-06-22 ENCOUNTER — Ambulatory Visit (INDEPENDENT_AMBULATORY_CARE_PROVIDER_SITE_OTHER): Payer: Medicare Other | Admitting: Podiatry

## 2016-06-22 ENCOUNTER — Encounter: Payer: Self-pay | Admitting: Podiatry

## 2016-06-22 VITALS — BP 163/84 | HR 74 | Temp 98.9°F

## 2016-06-22 DIAGNOSIS — L03031 Cellulitis of right toe: Secondary | ICD-10-CM | POA: Diagnosis not present

## 2016-06-22 DIAGNOSIS — L02611 Cutaneous abscess of right foot: Secondary | ICD-10-CM | POA: Diagnosis not present

## 2016-06-22 DIAGNOSIS — S90421A Blister (nonthermal), right great toe, initial encounter: Secondary | ICD-10-CM

## 2016-06-22 MED ORDER — AMOXICILLIN-POT CLAVULANATE 875-125 MG PO TABS: 1.0000 | ORAL_TABLET | Freq: Two times a day (BID) | ORAL | 0 refills | Status: AC

## 2016-06-22 NOTE — Progress Notes (Signed)
   Subjective:    [name], male    DOB: 1950-11-17, 65 y.o.   MRN: [id number]  HPI  Pt presents with blister formation on his Lt hallux toe for 3 days now, first notice the blister after wearing dress shoes.    This patient presents today with a three-day history of a blister formation on the dorsal aspect of left hallux that he noticed after wearing a dress shoe. He noticed that there is drainage from the area. He said no self treatment or professional treatment for this problem  Patient is a diabetic and was last seen in our office on 05/24/2016 for debridement of mycotic toenails and porokeratosis   Review of Systems  All other systems reviewed and are negative.      Objective:   Physical Exam  Orientated 3  Vascular: No calf edema calf tenderness bilaterally DP pulses 2/4 bilaterally ET pulses 2/4 bilaterally Capillary reflex immediate bilaterally  Neurological: Sensation to 10 g monofilament wire intact 2/5 right and 3/5 left Vibratory sensation reactive right nonreactive left Ankle reflexes reactive bilaterally  Dermatological: A elevated blister formation dorsal left hallux when compressed releases a viscous brown fluid without odor. There is no low-grade erythematous base without any malodor or warmth. This blister fluid was submitted for culture and sensitivity Large hyperkeratotic lesions medial hallux bilaterally  Musculoskeletal: HAV deformities bilaterally      Assessment & Plan:   Assessment: Diabetic with satisfactory vascular status Diabetic peripheral neuropathy Blister/low-grade abscess-cellulitis dorsal aspect of left hallux  Plan: Blister fluid submitted for culture and sensitivity Rx Augmentin 875/125 by mouth twice a day 7 days Patient advised to apply topical antibiotic ointment and Band-Aid or gauze pad to the area daily Patient instructed to return for follow-up if the symptoms did not respond to the oral antibiotics  or he developed any sudden increase of pain, swelling, redness in the blister site to present to the emergency department  He will be seen at his regular intervals for skin a nail debridement and less patient contacts [place] for follow-up for the blister/abscess/low-grade cellulitis on the dorsal aspect left hallux

## 2016-06-22 NOTE — Patient Instructions (Signed)
Apply topical antibiotic ointment to blister site on the left great toe daily such as triple antibiotic ointment and cover with Band-Aid until healed Begin taking oral antibiotics by mouth one twice a day 7 days If you develop any sudden increase in pain, swelling, drainage, fever present to the emergency department If this blister does not resolve after completing antibiotics return for follow-up visit  Diabetes and Foot Care Diabetes may cause you to have problems because of poor blood supply (circulation) to your feet and legs. This may cause the skin on your feet to become thinner, break easier, and heal more slowly. Your skin may become dry, and the skin may peel and crack. You may also have nerve damage in your legs and feet causing decreased feeling in them. You may not notice minor injuries to your feet that could lead to infections or more serious problems. Taking care of your feet is one of the most important things you can do for yourself.  HOME CARE INSTRUCTIONS  Wear shoes at all times, even in the house. Do not go barefoot. Bare feet are easily injured.  Check your feet daily for blisters, cuts, and redness. If you cannot see the bottom of your feet, use a mirror or ask someone for help.  Wash your feet with warm water (do not use hot water) and mild soap. Then pat your feet and the areas between your toes until they are completely dry. Do not soak your feet as this can dry your skin.  Apply a moisturizing lotion or petroleum jelly (that does not contain alcohol and is unscented) to the skin on your feet and to dry, brittle toenails. Do not apply lotion between your toes.  Trim your toenails straight across. Do not dig under them or around the cuticle. File the edges of your nails with an emery board or nail file.  Do not cut corns or calluses or try to remove them with medicine.  Wear clean socks or stockings every day. Make sure they are not too tight. Do not wear knee-high  stockings since they may decrease blood flow to your legs.  Wear shoes that fit properly and have enough cushioning. To break in new shoes, wear them for just a few hours a day. This prevents you from injuring your feet. Always look in your shoes before you put them on to be sure there are no objects inside.  Do not cross your legs. This may decrease the blood flow to your feet.  If you find a minor scrape, cut, or break in the skin on your feet, keep it and the skin around it clean and dry. These areas may be cleansed with mild soap and water. Do not cleanse the area with peroxide, alcohol, or iodine.  When you remove an adhesive bandage, be sure not to damage the skin around it.  If you have a wound, look at it several times a day to make sure it is healing.  Do not use heating pads or hot water bottles. They may burn your skin. If you have lost feeling in your feet or legs, you may not know it is happening until it is too late.  Make sure your health care provider performs a complete foot exam at least annually or more often if you have foot problems. Report any cuts, sores, or bruises to your health care provider immediately. SEEK MEDICAL CARE IF:   You have an injury that is not healing.  You have cuts  or breaks in the skin.  You have an ingrown nail.  You notice redness on your legs or feet.  You feel burning or tingling in your legs or feet.  You have pain or cramps in your legs and feet.  Your legs or feet are numb.  Your feet always feel cold. SEEK IMMEDIATE MEDICAL CARE IF:   There is increasing redness, swelling, or pain in or around a wound.  There is a red line that goes up your leg.  Pus is coming from a wound.  You develop a fever or as directed by your health care provider.  You notice a bad smell coming from an ulcer or wound.   This information is not intended to replace advice given to you by your health care provider. Make sure you discuss any questions  you have with your health care provider.   Document Released: 10/07/2000 Document Revised: 06/12/2013 Document Reviewed: 03/19/2013 Elsevier Interactive Patient Education [employer].

## 2016-06-23 DIAGNOSIS — L02611 Cutaneous abscess of right foot: Secondary | ICD-10-CM | POA: Diagnosis not present

## 2016-06-23 DIAGNOSIS — S90421A Blister (nonthermal), right great toe, initial encounter: Secondary | ICD-10-CM | POA: Diagnosis not present

## 2016-06-23 DIAGNOSIS — L03031 Cellulitis of right toe: Secondary | ICD-10-CM | POA: Diagnosis not present

## 2016-06-28 ENCOUNTER — Other Ambulatory Visit: Payer: Medicare Other

## 2016-06-28 ENCOUNTER — Other Ambulatory Visit: Payer: Self-pay | Admitting: *Deleted

## 2016-06-28 DIAGNOSIS — B20 Human immunodeficiency virus [HIV] disease: Secondary | ICD-10-CM | POA: Diagnosis not present

## 2016-06-28 LAB — CBC
HEMATOCRIT: 42.8 % (ref 38.5–50.0)
HEMOGLOBIN: 14.7 g/dL (ref 13.2–17.1)
MCH: 32.1 pg (ref 27.0–33.0)
MCHC: 34.3 g/dL (ref 32.0–36.0)
MCV: 93.4 fL (ref 80.0–100.0)
MPV: 11 fL (ref 7.5–12.5)
Platelets: 177 10*3/uL (ref 140–400)
RBC: 4.58 MIL/uL (ref 4.20–5.80)
RDW: 14.9 % (ref 11.0–15.0)
WBC: 6.2 10*3/uL (ref 3.8–10.8)

## 2016-06-28 LAB — COMPREHENSIVE METABOLIC PANEL
ALBUMIN: 3.7 g/dL (ref 3.6–5.1)
ALK PHOS: 76 U/L (ref 40–115)
ALT: 31 U/L (ref 9–46)
AST: 26 U/L (ref 10–35)
BILIRUBIN TOTAL: 0.4 mg/dL (ref 0.2–1.2)
BUN: 34 mg/dL — ABNORMAL HIGH (ref 7–25)
CALCIUM: 9 mg/dL (ref 8.6–10.3)
CO2: 19 mmol/L — ABNORMAL LOW (ref 20–31)
Chloride: 112 mmol/L — ABNORMAL HIGH (ref 98–110)
Creat: 1.89 mg/dL — ABNORMAL HIGH (ref 0.70–1.25)
Glucose, Bld: 111 mg/dL — ABNORMAL HIGH (ref 65–99)
Potassium: 5.6 mmol/L — ABNORMAL HIGH (ref 3.5–5.3)
Sodium: 137 mmol/L (ref 135–146)
TOTAL PROTEIN: 7.6 g/dL (ref 6.1–8.1)

## 2016-06-28 MED ORDER — GABAPENTIN 300 MG PO CAPS: 300.0000 mg | ORAL_CAPSULE | Freq: Three times a day (TID) | ORAL | 1 refills | Status: DC

## 2016-06-28 NOTE — Telephone Encounter (Signed)
Patient requested refill to be faxed to pharmacy.  

## 2016-06-29 LAB — T-HELPER CELL (CD4) - (RCID CLINIC ONLY)
CD4 T CELL ABS: 1330 /uL (ref 400–2700)
CD4 T CELL HELPER: 39 % (ref 33–55)

## 2016-06-30 LAB — HIV-1 RNA QUANT-NO REFLEX-BLD
HIV 1 RNA Quant: <20 copies/mL (ref <20)
HIV-1 RNA Quant, Log: <1.3 Log copies/mL (ref <1.30)

## 2016-07-08 ENCOUNTER — Other Ambulatory Visit: Payer: Medicare Other

## 2016-07-12 ENCOUNTER — Other Ambulatory Visit: Payer: Medicare Other

## 2016-07-12 ENCOUNTER — Encounter: Payer: Self-pay | Admitting: Internal Medicine

## 2016-07-12 ENCOUNTER — Ambulatory Visit (INDEPENDENT_AMBULATORY_CARE_PROVIDER_SITE_OTHER): Payer: Medicare Other | Admitting: Internal Medicine

## 2016-07-12 DIAGNOSIS — Z23 Encounter for immunization: Secondary | ICD-10-CM | POA: Diagnosis not present

## 2016-07-12 DIAGNOSIS — E785 Hyperlipidemia, unspecified: Secondary | ICD-10-CM | POA: Diagnosis not present

## 2016-07-12 DIAGNOSIS — B20 Human immunodeficiency virus [HIV] disease: Secondary | ICD-10-CM | POA: Diagnosis not present

## 2016-07-12 DIAGNOSIS — E1140 Type 2 diabetes mellitus with diabetic neuropathy, unspecified: Secondary | ICD-10-CM | POA: Diagnosis not present

## 2016-07-12 DIAGNOSIS — Z794 Long term (current) use of insulin: Secondary | ICD-10-CM | POA: Diagnosis not present

## 2016-07-12 NOTE — Progress Notes (Signed)
Patient Active Problem List   Diagnosis Date Noted  . Human immunodeficiency virus (HIV) disease ([place]) 09/02/2006    Priority: High  . History of lumbar laminectomy for spinal cord decompression 02/29/2016  . Acute renal failure superimposed on stage 3 chronic kidney disease ([place]) 11/03/2015  . Dehydration 11/03/2015  . Alcohol dependence ([place]) 11/03/2015  . Diabetes mellitus, type 2 ([place]) 06/17/2015  . Acute renal failure ([place]) 04/26/2015  . HTN (hypertension) 04/26/2015  . Syncope 04/26/2015  . DM (diabetes mellitus) ([place]) 04/26/2015  . Ischemic cardiomyopathy 05/12/2014  . Diarrhea 09/08/2013  . Shock ([place]) 09/06/2013  . Ulcer of lower extremity ([place]) 09/06/2013  . Community acquired pneumonia 09/06/2013  . Hyponatremia 09/06/2013  . Hyperkalemia 09/06/2013  . Acute kidney injury ([place]) 09/06/2013  . Hyperglycemia 09/06/2013  . Elevated transaminase level 09/06/2013  . Chest pain 09/06/2013  . Type II or unspecified type diabetes mellitus with unspecified complication, not stated as uncontrolled 10/22/2012  . Black stool 04/17/2012  . Bunion of left foot 11/25/2011  . Bunion, right foot 11/25/2011  . Polysubstance abuse 07/28/2011  . Hip fracture, left ([place]) 04/04/2011  . Closed fracture of neck of femur ([place]) 04/04/2011  . Insomnia 02/18/2011  . CAD (coronary artery disease) 04/20/2009  . Chronic combined systolic and diastolic congestive heart failure ([place]) 04/20/2009  . HLD (hyperlipidemia) 11/20/2006  . Gout 11/20/2006  . TOBACCO ABUSE 11/20/2006  . Essential hypertension 11/20/2006  . SYPHILIS 09/02/2006    Patient's Medications  New Prescriptions   No medications on file  Previous Medications   ALLOPURINOL (ZYLOPRIM) 100 MG TABLET    Take 1 tablet (100 mg total) by mouth daily.   ASPIRIN 81 MG TABLET    Take 81 mg by mouth daily. Reported on 04/04/2016   ATORVASTATIN (LIPITOR) 20 MG TABLET    Take 1 tablet (20 mg total) by mouth daily.   B-D UF  III MINI PEN NEEDLES 31G X 5 MM MISC    USE 4 TIMES DAILY   CARVEDILOL (COREG) 25 MG TABLET    Take 1 tablet (25 mg total) by mouth 2 (two) times daily with a meal.   CLINDAMYCIN (CLEOCIN) 300 MG CAPSULE    Take 1 capsule (300 mg total) by mouth 3 (three) times daily.   COLCHICINE 0.6 MG TABLET    Take 0.6 mg by mouth daily as needed. Reported on 04/04/2016   ELVITEGRAVIR-COBICISTAT-EMTRICITABINE-TENOFOVIR (GENVOYA) 150-150-200-10 MG TABS TABLET    TAKE 1 TABLET BY MOUTH DAILY WITH BREAKFAST.   GABAPENTIN (NEURONTIN) 300 MG CAPSULE    Take 1 capsule (300 mg total) by mouth 3 (three) times daily.   GLUCOSAMINE-CHONDROIT-VIT C-MN (GLUCOSAMINE CHONDR 1500 COMPLX PO)    Take 2 tablets by mouth daily. Reported on 04/04/2016   GLUCOSE BLOOD TEST STRIP    Check BS tice daily as instructed.   HYDRALAZINE (APRESOLINE) 25 MG TABLET    Take 1 tablet (25 mg total) by mouth 3 (three) times daily.   INSULIN GLARGINE (LANTUS SOLOSTAR) 100 UNIT/ML SOLOSTAR PEN    Inject 70 Units into the skin daily. E11.22   INSULIN LISPRO (HUMALOG KWIKPEN) 100 UNIT/ML KIWKPEN    Inject 0.12 mLs (12 Units total) into the skin 3 (three) times daily. Before Meals DX E11.22   ISOSORBIDE MONONITRATE (IMDUR) 30 MG 24 HR TABLET    Take 30 mg by mouth daily. Reported on 04/04/2016   ISOSORBIDE MONONITRATE (IMDUR) 30 MG 24 HR TABLET  take 1 tablet by mouth once daily   LATANOPROST (XALATAN) 0.005 % OPHTHALMIC SOLUTION    Place 1 drop into both eyes at bedtime. Reported on 04/04/2016   NAFTIFINE HCL 2 % CREA    Apply to affected area two times a day   NITROGLYCERIN (NITROSTAT) 0.3 MG SL TABLET    Place 1 tablet (0.3 mg total) under the tongue as needed for chest pain (not to exceed 3 tablets in one day.).   ONE TOUCH LANCETS MISC    Check BS twice daily.   SACCHAROMYCES BOULARDII (FLORASTOR) 250 MG CAPSULE    Take 1 capsule (250 mg total) by mouth 2 (two) times daily.   UMECLIDINIUM-VILANTEROL (ANORO ELLIPTA) 62.5-25 MCG/INH AEPB    Inhale 1  puff into the lungs daily.  Modified Medications   No medications on file  Discontinued Medications   No medications on file    Subjective: Jacob [name] is in for his routine HIV follow-up visit. He has had no problems obtaining, tolerating or taking his genital area. He takes it each morning with breakfast around 6:30 AM. He recalls missing only one dose since his last visit feeling well and did not take his morning medications. He decided not to take it that afternoon. He is feeling well. He is no longer having back pain after back surgery.   Review of Systems: Review of Systems  Constitutional: Negative for chills, diaphoresis, fever, malaise/fatigue and weight loss.  HENT: Negative for sore throat.   Respiratory: Negative for cough, sputum production and shortness of breath.   Cardiovascular: Negative for chest pain.  Gastrointestinal: Negative for abdominal pain, diarrhea, nausea and vomiting.  Genitourinary: Negative for dysuria and frequency.  Musculoskeletal: Negative for back pain, joint pain and myalgias.  Skin: Negative for rash.  Neurological: Negative for dizziness and headaches.  Psychiatric/Behavioral: Negative for depression and substance abuse. The patient is not nervous/anxious.     Past Medical History:  Diagnosis Date  . Arthritis   . Cataracts, bilateral    immature  . CHF (congestive heart failure) (HCC)    not on any meds  . Chronic back pain    stenosis  . Chronic kidney disease, unspecified ([place])   . Coronary atherosclerosis of artery bypass graft   . Coronary atherosclerosis of native coronary artery 2005   s/p surgery  . Drug abuse    hx; tested for cocaine as recently as 2/08. says he is not using drugs now - avoided defib. for this reason   . GERD (gastroesophageal reflux disease)    takes OTC meds as needed  . Glaucoma    uses eye drops daily  . Gout, unspecified    takes Allopurinol daily as well as Colchicine-if needed  . Hepatitis 1967   Hep C  .  History of colon polyps    benign  . HTN (hypertension)    takes Coreg,Imdur.and Apresoline daily  . Human immunodeficiency virus (HIV) disease ([place])    takes Genvoya daily  . Hyperlipidemia    takes Atorvastatin daily  . Ischemic cardiomyopathy   . Muscle spasm    takes Zanaflex as needed  . Nocturia   . Peripheral neuropathy (HCC)    takes gabapentin daily  . Pneumonia    hx of  . Shortness of breath dyspnea    rarely but if notices it then with exertion  . Syphilis, unspecified   . Type II or unspecified type diabetes mellitus without mention of complication, not stated as uncontrolled 2004  Lantus daily.Average fasting blood sugar 125-199    Social History  Substance Use Topics  . Smoking status: Current Every Day Smoker    Packs/day: 0.50    Years: 41.00    Types: Cigarettes  . Smokeless tobacco: Never Used  . Alcohol use 3.6 oz/week    6 Standard drinks or equivalent per week     Comment: Drinks 4-5 beers on weekends.     Family History  Problem Relation Age of Onset  . Heart failure Father   . Hypertension Father   . Diabetes Brother   . Heart attack Brother   . Alzheimer's disease Mother   . Stroke Sister   . Diabetes Sister   . Alzheimer's disease Sister   . Hypertension Brother   . Diabetes Brother   . Drug abuse Brother   . Colon cancer Neg Hx     Allergies  Allergen Reactions  . Amphetamines Other (See Comments)    Pt is unaware of this -     Objective:  There were no vitals filed for this visit. There is no height or weight on file to calculate BMI.  Physical Exam  Constitutional: He is oriented to person, place, and time.  He is in good spirits.  HENT:  Mouth/Throat: No oropharyngeal exudate.  Eyes: Conjunctivae are normal.  Cardiovascular: Normal rate and regular rhythm.   No murmur heard. Pulmonary/Chest: Effort normal and breath sounds normal. He has no wheezes. He has no rales.  Abdominal: Soft. He exhibits no mass. There is no  tenderness.  Musculoskeletal: Normal range of motion.  Neurological: He is alert and oriented to person, place, and time. Gait normal.  Skin: No rash noted.  Psychiatric: Mood and affect normal.    Lab Results Lab Results  Component Value Date   WBC 6.2 06/28/2016   HGB 14.7 06/28/2016   HCT 42.8 06/28/2016   MCV 93.4 06/28/2016   PLT 177 06/28/2016    Lab Results  Component Value Date   CREATININE 1.89 (H) 06/28/2016   BUN 34 (H) 06/28/2016   NA 137 06/28/2016   K 5.6 (H) 06/28/2016   CL 112 (H) 06/28/2016   CO2 19 (L) 06/28/2016    Lab Results  Component Value Date   ALT 31 06/28/2016   AST 26 06/28/2016   ALKPHOS 76 06/28/2016   BILITOT 0.4 06/28/2016    Lab Results  Component Value Date   CHOL 110 (L) 12/29/2015   HDL 23 (L) 12/29/2015   LDLCALC NOT CALC 12/29/2015   TRIG 564 (H) 12/29/2015   CHOLHDL 4.8 12/29/2015   HIV 1 RNA Quant (copies/mL)  Date Value  06/28/2016 <20  12/29/2015 <20  06/30/2015 <20   CD4 T Cell Abs (/uL)  Date Value  06/28/2016 1,330  12/29/2015 1,050  06/30/2015 900     Problem List Items Addressed This Visit      High   Human immunodeficiency virus (HIV) disease ([place])    His infection remains under excellent, long-term control. He will continue gentle area and follow-up here after lab work in one year. He received influenza vaccine today.      Relevant Orders   T-helper cell (CD4)- ([facility] clinic only)   HIV 1 RNA quant-no reflex-bld   CBC   Comprehensive metabolic panel   Lipid panel   RPR    Other Visit Diagnoses   None.       [name], MD [facility] [phone]  pager   [phone] cell 07/12/2016, 10:06 AM

## 2016-07-12 NOTE — Assessment & Plan Note (Signed)
His infection remains under excellent, long-term control. He will continue gentle area and follow-up here after lab work in one year. He received influenza vaccine today.

## 2016-07-13 LAB — BASIC METABOLIC PANEL
BUN: 26 mg/dL — ABNORMAL HIGH (ref 7–25)
CO2: 21 mmol/L (ref 20–31)
Calcium: 8.8 mg/dL (ref 8.6–10.3)
Chloride: 112 mmol/L — ABNORMAL HIGH (ref 98–110)
Creat: 1.66 mg/dL — ABNORMAL HIGH (ref 0.70–1.25)
Glucose, Bld: 63 mg/dL — ABNORMAL LOW (ref 65–99)
Potassium: 4.6 mmol/L (ref 3.5–5.3)
Sodium: 138 mmol/L (ref 135–146)

## 2016-07-13 LAB — LIPID PANEL
Cholesterol: 150 mg/dL (ref 125–200)
HDL: 34 mg/dL — ABNORMAL LOW (ref >=40)
Total CHOL/HDL Ratio: 4.4 Ratio (ref <=5.0)
Triglycerides: 674 mg/dL — ABNORMAL HIGH (ref <150)

## 2016-07-15 ENCOUNTER — Encounter: Payer: Self-pay | Admitting: Internal Medicine

## 2016-07-15 ENCOUNTER — Ambulatory Visit (INDEPENDENT_AMBULATORY_CARE_PROVIDER_SITE_OTHER): Payer: Medicare Other | Admitting: Internal Medicine

## 2016-07-15 VITALS — BP 142/82 | HR 65 | Temp 98.0°F | Ht 74.0 in | Wt 195.8 lb

## 2016-07-15 DIAGNOSIS — B20 Human immunodeficiency virus [HIV] disease: Secondary | ICD-10-CM | POA: Diagnosis not present

## 2016-07-15 DIAGNOSIS — Z794 Long term (current) use of insulin: Secondary | ICD-10-CM

## 2016-07-15 DIAGNOSIS — Z720 Tobacco use: Secondary | ICD-10-CM | POA: Diagnosis not present

## 2016-07-15 DIAGNOSIS — E785 Hyperlipidemia, unspecified: Secondary | ICD-10-CM

## 2016-07-15 DIAGNOSIS — E781 Pure hyperglyceridemia: Secondary | ICD-10-CM | POA: Diagnosis not present

## 2016-07-15 DIAGNOSIS — I10 Essential (primary) hypertension: Secondary | ICD-10-CM | POA: Diagnosis not present

## 2016-07-15 DIAGNOSIS — E1140 Type 2 diabetes mellitus with diabetic neuropathy, unspecified: Secondary | ICD-10-CM | POA: Diagnosis not present

## 2016-07-15 DIAGNOSIS — K219 Gastro-esophageal reflux disease without esophagitis: Secondary | ICD-10-CM | POA: Diagnosis not present

## 2016-07-15 MED ORDER — ATORVASTATIN CALCIUM 20 MG PO TABS: 20.0000 mg | ORAL_TABLET | Freq: Every day | ORAL | 3 refills | Status: DC

## 2016-07-15 MED ORDER — OMEPRAZOLE 20 MG PO CPDR: 20.0000 mg | DELAYED_RELEASE_CAPSULE | Freq: Every day | ORAL | 3 refills | Status: DC

## 2016-07-15 MED ORDER — ALLOPURINOL 100 MG PO TABS
100.0000 mg | ORAL_TABLET | Freq: Every day | ORAL | 3 refills | Status: DC
Start: 2016-07-15 — End: 2016-12-22

## 2016-07-15 MED ORDER — CHOLINE FENOFIBRATE 135 MG PO CPDR: 135.0000 mg | DELAYED_RELEASE_CAPSULE | Freq: Every day | ORAL | 6 refills | Status: DC

## 2016-07-15 MED ORDER — UMECLIDINIUM-VILANTEROL 62.5-25 MCG/INH IN AEPB: 1.0000 | INHALATION_SPRAY | Freq: Every day | RESPIRATORY_TRACT | 3 refills | Status: DC

## 2016-07-15 NOTE — Patient Instructions (Signed)
START OMEPRAZOLE DAILY FOR ACID REFLUX; TRILIPIX (FENOFIBRATE) FOR ELEVATED TRIGLYCERIDES  Follow up in 1 month for fasting labs  Continue other medications as ordered  Follow up with specialists as scheduled  Reduce fatty food intake to improve cholesterol panel - including french fries, fried fish, etc.  Smoking cessation discussed and highly urged  Follow up in 3 mos for routine visit

## 2016-07-15 NOTE — Progress Notes (Signed)
[name], male   DOB: [birth year], 65 y.o.   MRN: [id number]    Location:  [facility] Place of Service: OFFICE  Chief Complaint  Patient presents with  . Medical Management of Chronic Issues    3 month for medication Management    HPI:  65 yo male seen today for f/u. He has frequent indigestion. Consumes about 6 tums per day. He does not take PPI. Had flu shot at Dr Hale Bogus office earlier this week  tob abuse - still smoking but down to 1/2 ppd  HIV - stable on HAART tx. HIV1 RNA quant <20; log <1.3. CD4 abs 1330. Followed by ID Dr [name]  HTN - BP stable on hydralazine, coreg, imdur. Off ARB since hospital d/c   DM - BS improved with 1/2 dose lantus on each side of abdomen. BS 171 this AM (had starburst last night).  No low BS reactions. Numbness/tingling in feet. Takes Lantus  insulin 70 units qhs and humalog 12 units qAC . Followed by podiatry. No ulcerations but has calluses. A1c 8.6%. He is not on ARB due to AKI and hyperkalemia. He has an eye specialist  Hyperlipidemia - TG 674; LDL not calculated due to TG>400. He is currently on lipitor 36m qhs. He likes fried fish and eats it once a week. He also likes cookies and starbursts and eats french fries several times a week.  Past Medical History:  Diagnosis Date  . Arthritis   . Cataracts, bilateral    immature  . CHF (congestive heart failure) (HCC)    not on any meds  . Chronic back pain    stenosis  . Chronic kidney disease, unspecified ([place]   . Coronary atherosclerosis of artery bypass graft   . Coronary atherosclerosis of native coronary artery 2005   s/p surgery  . Drug abuse    hx; tested for cocaine as recently as 2/08. says he is not using drugs now - avoided defib. for this reason   . GERD (gastroesophageal reflux disease)    takes OTC meds as needed  . Glaucoma    uses eye drops daily  . Gout, unspecified    takes Allopurinol daily as well as Colchicine-if needed  . Hepatitis 1967   Hep C    . History of colon polyps    benign  . HTN (hypertension)    takes Coreg,Imdur.and Apresoline daily  . Human immunodeficiency virus (HIV) disease ([facility]    takes Genvoya daily  . Hyperlipidemia    takes Atorvastatin daily  . Ischemic cardiomyopathy   . Muscle spasm    takes Zanaflex as needed  . Nocturia   . Peripheral neuropathy (HCC)    takes gabapentin daily  . Pneumonia    hx of  . Shortness of breath dyspnea    rarely but if notices it then with exertion  . Syphilis, unspecified   . Type II or unspecified type diabetes mellitus without mention of complication, not stated as uncontrolled 2004   Lantus daily.Average fasting blood sugar 125-199    Past Surgical History:  Procedure Laterality Date  . COLONOSCOPY  2013   Dr.John PHenrene Pastor  . CORONARY ARTERY BYPASS GRAFT    . laproscopic cholecystectomy  8/07  . left intertrochanteric hip fracture  2004   s/p intermedullary nail placement 2/08  . left transverse mandibular fracture  10/05  . LUMBAR LAMINECTOMY/DECOMPRESSION MICRODISCECTOMY N/A 02/29/2016   Procedure: Left L4-5 Lateral Recess Decompression, Removal Extradural Intraspinal Facet Cyst;  Surgeon: Marybelle Killings, MD;  Location: [facility];  Service: Orthopedics;  Laterality: N/A;    Patient Care Team: Gildardo Cranker, DO as PCP - General (Internal Medicine) [name], MD as PCP - Infectious Diseases (Infectious Diseases) [name], MD as Consulting Physician (Ophthalmology) [name], MD as Consulting Physician (Cardiology)  Social History   Social History  . Marital status: Single    Spouse name: N/A  . Number of children: N/A  . Years of education: N/A   Occupational History  . Not on file.   Social History Main Topics  . Smoking status: Current Every Day Smoker    Packs/day: 0.50    Years: 41.00    Types: Cigarettes  . Smokeless tobacco: Never Used  . Alcohol use 3.6 oz/week    6 Standard drinks or equivalent per week     Comment: Drinks 4-5  beers on weekends.   . Drug use: No     Comment: hx of crack/cocaine 77yr ago  . Sexual activity: Yes    Partners: Male     Comment: declined condoms   Other Topics Concern  . Not on file   Social History Narrative   Retired.       As of 05/2015:   Diet: No salt    Caffeine   Married: Single    House: Condo, 2 stories, 1 person (self)    Pets: No    Current/Past profession: N/A   Exercise: walks daily    Living Will: Yes    DNR   POA/HPOA: No            reports that he has been smoking Cigarettes.  He has a 20.50 pack-year smoking history. He has never used smokeless tobacco. He reports that he drinks about 3.6 oz of alcohol per week . He reports that he does not use drugs.  Family History  Problem Relation Age of Onset  . Heart failure Father   . Hypertension Father   . Diabetes Brother   . Heart attack Brother   . Alzheimer's disease Mother   . Stroke Sister   . Diabetes Sister   . Alzheimer's disease Sister   . Hypertension Brother   . Diabetes Brother   . Drug abuse Brother   . Colon cancer Neg Hx    Family Status  Relation Status  . Father Deceased   heart failure  . Brother Alive  . Brother Alive  . Mother Deceased  . Sister Alive  . Brother Deceased   MI; also did cocaine   . Sister Alive  . Sister Alive  . Brother   . Brother   . Neg Hx      Allergies  Allergen Reactions  . Amphetamines Other (See Comments)    Pt is unaware of this -     Medications: Patient's Medications  New Prescriptions   No medications on file  Previous Medications   ALLOPURINOL (ZYLOPRIM) 100 MG TABLET    Take 1 tablet (100 mg total) by mouth daily.   ASPIRIN 81 MG TABLET    Take 81 mg by mouth daily. Reported on 04/04/2016   ATORVASTATIN (LIPITOR) 20 MG TABLET    Take 1 tablet (20 mg total) by mouth daily.   B-D UF III MINI PEN NEEDLES 31G X 5 MM MISC    USE 4 TIMES DAILY   CARVEDILOL (COREG) 25 MG TABLET    Take 1 tablet (25 mg total) by mouth 2 (two) times  daily with a meal.   COLCHICINE 0.6 MG TABLET    Take 0.6 mg by mouth daily as needed. Reported on 04/04/2016   ELVITEGRAVIR-COBICISTAT-EMTRICITABINE-TENOFOVIR (GENVOYA) 150-150-200-10 MG TABS TABLET    TAKE 1 TABLET BY MOUTH DAILY WITH BREAKFAST.   GABAPENTIN (NEURONTIN) 300 MG CAPSULE    Take 1 capsule (300 mg total) by mouth 3 (three) times daily.   GLUCOSAMINE-CHONDROIT-VIT C-MN (GLUCOSAMINE CHONDR 1500 COMPLX PO)    Take 2 tablets by mouth daily. Reported on 04/04/2016   GLUCOSE BLOOD TEST STRIP    Check BS tice daily as instructed.   HYDRALAZINE (APRESOLINE) 25 MG TABLET    Take 1 tablet (25 mg total) by mouth 3 (three) times daily.   INSULIN GLARGINE (LANTUS SOLOSTAR) 100 UNIT/ML SOLOSTAR PEN    Inject 70 Units into the skin daily. E11.22   INSULIN LISPRO (HUMALOG KWIKPEN) 100 UNIT/ML KIWKPEN    Inject 0.12 mLs (12 Units total) into the skin 3 (three) times daily. Before Meals DX E11.22   ISOSORBIDE MONONITRATE (IMDUR) 30 MG 24 HR TABLET    Take 30 mg by mouth daily. Reported on 04/04/2016   ISOSORBIDE MONONITRATE (IMDUR) 30 MG 24 HR TABLET    take 1 tablet by mouth once daily   LATANOPROST (XALATAN) 0.005 % OPHTHALMIC SOLUTION    Place 1 drop into both eyes at bedtime. Reported on 04/04/2016   NAFTIFINE HCL 2 % CREA    Apply to affected area two times a day   NITROGLYCERIN (NITROSTAT) 0.3 MG SL TABLET    Place 1 tablet (0.3 mg total) under the tongue as needed for chest pain (not to exceed 3 tablets in one day.).   ONE TOUCH LANCETS MISC    Check BS twice daily.   UMECLIDINIUM-VILANTEROL (ANORO ELLIPTA) 62.5-25 MCG/INH AEPB    Inhale 1 puff into the lungs daily.  Modified Medications   No medications on file  Discontinued Medications   CLINDAMYCIN (CLEOCIN) 300 MG CAPSULE    Take 1 capsule (300 mg total) by mouth 3 (three) times daily.   SACCHAROMYCES BOULARDII (FLORASTOR) 250 MG CAPSULE    Take 1 capsule (250 mg total) by mouth 2 (two) times daily.    Review of Systems  Respiratory:  Positive for chest tightness (improves with tums).   Musculoskeletal: Positive for arthralgias and joint swelling.  All other systems reviewed and are negative.   Vitals:   07/15/16 1121  BP: (!) 142/82  Pulse: 65  Temp: 98 F (36.7 C)  TempSrc: Oral  SpO2: 97%  Weight: 195 lb 12.8 oz (88.8 kg)  Height: '6\' 2"'  (1.88 m)   Body mass index is 25.14 kg/m.  Physical Exam  Constitutional: He is oriented to person, place, and time. He appears well-developed and well-nourished.  HENT:  Mouth/Throat: Oropharynx is clear and moist.  No gum swelling/ d/c noted  Eyes: Pupils are equal, round, and reactive to light. No scleral icterus.  Neck: Neck supple. Carotid bruit is not present. No thyromegaly present.  Cardiovascular: Normal rate, regular rhythm, normal heart sounds and intact distal pulses.  Exam reveals no gallop and no friction rub.   No murmur heard. no distal LE swelling. No calf TTP  Pulmonary/Chest: Effort normal and breath sounds normal. He has no wheezes. He has no rales. He exhibits no tenderness.  Abdominal: Soft. Bowel sounds are normal. He exhibits distension. He exhibits no abdominal bruit, no pulsatile midline mass and no mass. There is no tenderness. There is no rebound and  no guarding.  Musculoskeletal: He exhibits edema and tenderness.  Lymphadenopathy:    He has no cervical adenopathy.  Neurological: He is alert and oriented to person, place, and time.  Skin: Skin is warm and dry. No rash noted.  Psychiatric: He has a normal mood and affect. His behavior is normal. Judgment and thought content normal.     Labs reviewed: Appointment on 07/08/2016  Component Date Value Ref Range Status  . Cholesterol 07/13/2016 150  125 - 200 mg/dL Final  . Triglycerides 07/13/2016 674* <150 mg/dL Final  . HDL 07/13/2016 34* >=40 mg/dL Final  . Total CHOL/HDL Ratio 07/13/2016 4.4  <=5.0 Ratio Final  . VLDL 07/13/2016 NOT CALC  <30 mg/dL Final   Comment:   Not calculated due  to Triglyceride >400. Suggest ordering Direct LDL (Unit Code: [phone]).   . LDL Cholesterol 07/13/2016 NOT CALC  <130 mg/dL Final   Comment:   Not calculated due to Triglyceride >400. Suggest ordering Direct LDL (Unit Code: [phone]).   Total Cholesterol/HDL Ratio:CHD Risk                        Coronary Heart Disease Risk Table                                        Men       Women          1/2 Average Risk              3.4        3.3              Average Risk              5.0        4.4           2X Average Risk              9.6        7.1           3X Average Risk             23.4       11.0 Use the calculated Patient Ratio above and the CHD Risk table  to determine the patient's CHD Risk.   . Sodium 07/13/2016 138  135 - 146 mmol/L Final  . Potassium 07/13/2016 4.6  3.5 - 5.3 mmol/L Final  . Chloride 07/13/2016 112* 98 - 110 mmol/L Final  . CO2 07/13/2016 21  20 - 31 mmol/L Final  . Glucose, Bld 07/13/2016 63* 65 - 99 mg/dL Final  . BUN 07/13/2016 26* 7 - 25 mg/dL Final  . Creat 07/13/2016 1.66* 0.70 - 1.25 mg/dL Final   Comment:   For patients > or = 67 years of age: The upper reference limit for Creatinine is approximately 13% higher for people identified as African-American.     . Calcium 07/13/2016 8.8  8.6 - 10.3 mg/dL Final  Appointment on 06/28/2016  Component Date Value Ref Range Status  . CD4 T Cell Abs 06/29/2016 1330  400 - 2,700 /uL Final  . CD4 % Helper T Cell 06/29/2016 39  33 - 55 % Final  . HIV 1 RNA Quant 06/30/2016 <20  <20 copies/mL Final  . HIV1 RNA Quant, Log 06/30/2016 <1.30  <1.30 Log copies/mL Final   Comment:   This test  was performed using the COBAS AmpliPrep/COBAS TaqMan HIV-1 test kit version 2.0. ([place].)   . WBC 06/28/2016 6.2  3.8 - 10.8 K/uL Final  . RBC 06/28/2016 4.58  4.20 - 5.80 MIL/uL Final  . Hemoglobin 06/28/2016 14.7  13.2 - 17.1 g/dL Final  . HCT 06/28/2016 42.8  38.5 - 50.0 % Final  . MCV 06/28/2016 93.4   80.0 - 100.0 fL Final  . MCH 06/28/2016 32.1  27.0 - 33.0 pg Final  . MCHC 06/28/2016 34.3  32.0 - 36.0 g/dL Final  . RDW 06/28/2016 14.9  11.0 - 15.0 % Final  . Platelets 06/28/2016 177  140 - 400 K/uL Final  . MPV 06/28/2016 11.0  7.5 - 12.5 fL Final  . Sodium 06/28/2016 137  135 - 146 mmol/L Final  . Potassium 06/28/2016 5.6* 3.5 - 5.3 mmol/L Final  . Chloride 06/28/2016 112* 98 - 110 mmol/L Final  . CO2 06/28/2016 19* 20 - 31 mmol/L Final  . Glucose, Bld 06/28/2016 111* 65 - 99 mg/dL Final  . BUN 06/28/2016 34* 7 - 25 mg/dL Final  . Creat 06/28/2016 1.89* 0.70 - 1.25 mg/dL Final   Comment:   For patients > or = 67 years of age: The upper reference limit for Creatinine is approximately 13% higher for people identified as African-American.     . Total Bilirubin 06/28/2016 0.4  0.2 - 1.2 mg/dL Final  . Alkaline Phosphatase 06/28/2016 76  40 - 115 U/L Final  . AST 06/28/2016 26  10 - 35 U/L Final  . ALT 06/28/2016 31  9 - 46 U/L Final  . Total Protein 06/28/2016 7.6  6.1 - 8.1 g/dL Final  . Albumin 06/28/2016 3.7  3.6 - 5.1 g/dL Final  . Calcium 06/28/2016 9.0  8.6 - 10.3 mg/dL Final    No results found.   Assessment/Plan   ICD-9-CM ICD-10-CM   1. Gastroesophageal reflux disease, esophagitis presence not specified 530.81 K21.9 omeprazole (PRILOSEC) 20 MG capsule  2. Hypertriglyceridemia 272.1 E78.1 Choline Fenofibrate 135 MG capsule     Hepatic function panel     Lipid Panel  3. Type 2 diabetes mellitus with diabetic neuropathy, with long-term current use of insulin (HCC) 250.60 E11.40    357.2 Z79.4    V58.67    4. Essential hypertension 401.9 I10   5. Tobacco abuse 305.1 Z72.0   6. Human immunodeficiency virus (HIV) disease ([place]) 042 B20   7. HLD (hyperlipidemia) 272.4 E78.5 Hepatic function panel     Lipid Panel   START OMEPRAZOLE DAILY FOR ACID REFLUX; TRILIPIX (FENOFIBRATE) FOR ELEVATED TRIGLYCERIDES  Follow up in 1 month for fasting labs  Continue other  medications as ordered  Follow up with specialists as scheduled  Reduce fatty food intake to improve cholesterol panel - including french fries, fried fish, etc.  Smoking cessation discussed and highly urged  Follow up in 3 mos for routine visit   Jackee Glasner S. Perlie Gold  [facility] [address] [place], [place] [address] [phone] Cell (Monday-Friday 8 AM - 5 PM) [phone] After 5 PM and follow prompts

## 2016-07-25 ENCOUNTER — Telehealth: Payer: Self-pay | Admitting: *Deleted

## 2016-07-25 MED ORDER — GABAPENTIN 300 MG PO CAPS: 300.0000 mg | ORAL_CAPSULE | Freq: Three times a day (TID) | ORAL | 1 refills | Status: DC

## 2016-07-25 NOTE — Telephone Encounter (Signed)
Noted  

## 2016-07-25 NOTE — Telephone Encounter (Signed)
Patient offered an appointment and advised not to go to work but patient's boss is very particular per patient and would not be happy if he didn't show. Offered patient a work note to be seen and refused.  Neurontin faxed to pharmacy.

## 2016-07-25 NOTE — Telephone Encounter (Signed)
Patient called and stated that he has Diarrhea. He thinks it is coming from the Omeprazole and fenofibrate. Patient states that last night he had a fever of 101 but today it has came down to 99. Not feeling well. I offered him an appointment for today but he stated that he had to go into work. I told him we could give him a work note but he refused. I told him that the diarrhea could be coming from a bug and for him to stay hydrated and he agreed.   Also wanted to switch his Gabapentin to Neurontin. He stated that he knows its the Brand/Generic but he has had the Neurontin before and it helped better than the Gabapentin. Please Advise.

## 2016-07-25 NOTE — Telephone Encounter (Signed)
High recommend that he makes appt for diarrhea and he really should not go to work as it could be infectious. Ok to change gabapentin to brand necessary neurontin with same directions

## 2016-07-26 ENCOUNTER — Ambulatory Visit (INDEPENDENT_AMBULATORY_CARE_PROVIDER_SITE_OTHER): Payer: Medicare Other | Admitting: Nurse Practitioner

## 2016-07-26 ENCOUNTER — Encounter: Payer: Self-pay | Admitting: Nurse Practitioner

## 2016-07-26 VITALS — BP 138/84 | HR 82 | Temp 98.9°F | Resp 17 | Ht 74.0 in | Wt 196.0 lb

## 2016-07-26 DIAGNOSIS — R195 Other fecal abnormalities: Secondary | ICD-10-CM | POA: Diagnosis not present

## 2016-07-26 DIAGNOSIS — K219 Gastro-esophageal reflux disease without esophagitis: Secondary | ICD-10-CM | POA: Diagnosis not present

## 2016-07-26 DIAGNOSIS — R197 Diarrhea, unspecified: Secondary | ICD-10-CM

## 2016-07-26 MED ORDER — METRONIDAZOLE 500 MG PO TABS: 500.0000 mg | ORAL_TABLET | Freq: Three times a day (TID) | ORAL | 0 refills | Status: DC

## 2016-07-26 MED ORDER — OMEPRAZOLE 20 MG PO CPDR: 20.0000 mg | DELAYED_RELEASE_CAPSULE | Freq: Two times a day (BID) | ORAL | 3 refills | Status: DC

## 2016-07-26 NOTE — Progress Notes (Signed)
Careteam: Patient Care Team: Gildardo Cranker, DO as PCP - General (Internal Medicine) [name], MD as PCP - Infectious Diseases (Infectious Diseases) [name], MD as Consulting Physician (Ophthalmology) [name], MD as Consulting Physician (Cardiology)  Advanced Directive information Does patient have an advance directive?: Yes, Type of Advance Directive: [place];Living will  Allergies  Allergen Reactions  . Amphetamines Other (See Comments)    Pt is unaware of this -     Chief Complaint  Patient presents with  . Acute Visit    black, watery loose stool from Sunday to this morning. Fever of 100.5 on Sunday. Took pepto yesterday and last night.      HPI: Patient is a 65 y.o. male seen in the office today due to black watery stools for 3 days.  Pt reports he got back from church on Sunday and started to feel weak and fatigued. Had lunch and then started to have diarrhea. Was having diarrhea about every 1 hour.  Noticed fever of 100.5 on Sunday (3 days ago )  Reports stools were black Took pepto last night which has helped. No abdominal pain, no nausea or vomiting.  No chest pains or shortness of breath Reports sore throat that started 3 days ago as well. Also has dry cough No congestion.  Recently with multiple antibiotic use due to abscessed tooth Review of Systems:  Review of Systems  Constitutional: Positive for fatigue. Negative for chills and fever.  HENT: Positive for sore throat. Negative for congestion and tinnitus.   Respiratory: Negative for cough and shortness of breath.   Cardiovascular: Negative for chest pain, palpitations and leg swelling.  Gastrointestinal: Positive for anal bleeding and diarrhea. Negative for abdominal distention, abdominal pain, constipation, nausea, rectal pain and vomiting.  Genitourinary: Negative for dysuria, frequency and urgency.  Skin: Negative.   Neurological: Negative for dizziness and  headaches.    Past Medical History:  Diagnosis Date  . Arthritis   . Cataracts, bilateral    immature  . CHF (congestive heart failure) (HCC)    not on any meds  . Chronic back pain    stenosis  . Chronic kidney disease, unspecified   . Coronary atherosclerosis of artery bypass graft   . Coronary atherosclerosis of native coronary artery 2005   s/p surgery  . Drug abuse    hx; tested for cocaine as recently as 2/08. says he is not using drugs now - avoided defib. for this reason   . GERD (gastroesophageal reflux disease)    takes OTC meds as needed  . Glaucoma    uses eye drops daily  . Gout, unspecified    takes Allopurinol daily as well as Colchicine-if needed  . Hepatitis 1967   Hep C  . History of colon polyps    benign  . HTN (hypertension)    takes Coreg,Imdur.and Apresoline daily  . Human immunodeficiency virus (HIV) disease    takes Genvoya daily  . Hyperlipidemia    takes Atorvastatin daily  . Ischemic cardiomyopathy   . Muscle spasm    takes Zanaflex as needed  . Nocturia   . Peripheral neuropathy (HCC)    takes gabapentin daily  . Pneumonia    hx of  . Shortness of breath dyspnea    rarely but if notices it then with exertion  . Syphilis, unspecified   . Type II or unspecified type diabetes mellitus without mention of complication, not stated as uncontrolled 2004  Lantus daily.Average fasting blood sugar 125-199   Past Surgical History:  Procedure Laterality Date  . COLONOSCOPY  2013   Dr.John Henrene Pastor   . CORONARY ARTERY BYPASS GRAFT    . laproscopic cholecystectomy  8/07  . left intertrochanteric hip fracture  2004   s/p intermedullary nail placement 2/08  . left transverse mandibular fracture  10/05  . LUMBAR LAMINECTOMY/DECOMPRESSION MICRODISCECTOMY N/A 02/29/2016   Procedure: Left L4-5 Lateral Recess Decompression, Removal Extradural Intraspinal Facet Cyst;  Surgeon: Marybelle Killings, MD;  Location: [place];  Service: Orthopedics;  Laterality: N/A;    Social History:   reports that he has been smoking Cigarettes.  He has a 20.50 pack-year smoking history. He has never used smokeless tobacco. He reports that he drinks about 3.6 oz of alcohol per week . He reports that he does not use drugs.  Family History  Problem Relation Age of Onset  . Heart failure Father   . Hypertension Father   . Diabetes Brother   . Heart attack Brother   . Alzheimer's disease Mother   . Stroke Sister   . Diabetes Sister   . Alzheimer's disease Sister   . Hypertension Brother   . Diabetes Brother   . Drug abuse Brother   . Colon cancer Neg Hx     Medications: Patient's Medications  New Prescriptions   No medications on file  Previous Medications   ALLOPURINOL (ZYLOPRIM) 100 MG TABLET    Take 1 tablet (100 mg total) by mouth daily.   ASPIRIN 81 MG TABLET    Take 81 mg by mouth daily. Reported on 04/04/2016   ATORVASTATIN (LIPITOR) 20 MG TABLET    Take 1 tablet (20 mg total) by mouth daily.   B-D UF III MINI PEN NEEDLES 31G X 5 MM MISC    USE 4 TIMES DAILY   CARVEDILOL (COREG) 25 MG TABLET    Take 1 tablet (25 mg total) by mouth 2 (two) times daily with a meal.   CHOLINE FENOFIBRATE 135 MG CAPSULE    Take 1 capsule (135 mg total) by mouth daily.   COLCHICINE 0.6 MG TABLET    Take 0.6 mg by mouth daily as needed. Reported on 04/04/2016   ELVITEGRAVIR-COBICISTAT-EMTRICITABINE-TENOFOVIR (GENVOYA) 150-150-200-10 MG TABS TABLET    TAKE 1 TABLET BY MOUTH DAILY WITH BREAKFAST.   GABAPENTIN (NEURONTIN) 300 MG CAPSULE    Take 1 capsule (300 mg total) by mouth 3 (three) times daily.   GLUCOSAMINE-CHONDROIT-VIT C-MN (GLUCOSAMINE CHONDR 1500 COMPLX PO)    Take 2 tablets by mouth daily. Reported on 04/04/2016   GLUCOSE BLOOD TEST STRIP    Check BS tice daily as instructed.   HYDRALAZINE (APRESOLINE) 25 MG TABLET    Take 1 tablet (25 mg total) by mouth 3 (three) times daily.   INSULIN GLARGINE (LANTUS SOLOSTAR) 100 UNIT/ML SOLOSTAR PEN    Inject 70 Units into the skin  daily. E11.22   INSULIN LISPRO (HUMALOG KWIKPEN) 100 UNIT/ML KIWKPEN    Inject 0.12 mLs (12 Units total) into the skin 3 (three) times daily. Before Meals DX E11.22   ISOSORBIDE MONONITRATE (IMDUR) 30 MG 24 HR TABLET    Take 30 mg by mouth daily. Reported on 04/04/2016   LATANOPROST (XALATAN) 0.005 % OPHTHALMIC SOLUTION    Place 1 drop into both eyes at bedtime. Reported on 04/04/2016   NAFTIFINE HCL 2 % CREA    Apply to affected area two times a day   NITROGLYCERIN (NITROSTAT) 0.3 MG SL  TABLET    Place 1 tablet (0.3 mg total) under the tongue as needed for chest pain (not to exceed 3 tablets in one day.).   OMEPRAZOLE (PRILOSEC) 20 MG CAPSULE    Take 1 capsule (20 mg total) by mouth daily.   ONE TOUCH LANCETS MISC    Check BS twice daily.   UMECLIDINIUM-VILANTEROL (ANORO ELLIPTA) 62.5-25 MCG/INH AEPB    Inhale 1 puff into the lungs daily.  Modified Medications   No medications on file  Discontinued Medications   ISOSORBIDE MONONITRATE (IMDUR) 30 MG 24 HR TABLET    take 1 tablet by mouth once daily     Physical Exam:  Vitals:   07/26/16 1502  BP: 138/84  Pulse: 82  Resp: 17  Temp: 98.9 F (37.2 C)  TempSrc: Oral  Weight: 196 lb (88.9 kg)  Height: 6\' 2"  (1.88 m)   Body mass index is 25.16 kg/m.  Physical Exam  Constitutional: He is oriented to person, place, and time. He appears well-developed and well-nourished.  HENT:  Mouth/Throat: Oropharynx is clear and moist. No oropharyngeal exudate.  Eyes: Pupils are equal, round, and reactive to light. No scleral icterus.  Neck: Normal range of motion. Neck supple. Carotid bruit is not present. No thyromegaly present.  Cardiovascular: Normal rate, regular rhythm and normal heart sounds.   Pulmonary/Chest: Effort normal and breath sounds normal. He has no wheezes. He has no rales. He exhibits no tenderness.  Abdominal: Soft. Bowel sounds are normal. He exhibits no distension, no abdominal bruit, no pulsatile midline mass and no mass.  There is no tenderness. There is no rebound and no guarding.  Genitourinary: Rectal exam shows guaiac positive stool.  Lymphadenopathy:    He has no cervical adenopathy.  Neurological: He is alert and oriented to person, place, and time.  Skin: Skin is warm and dry.  Psychiatric: He has a normal mood and affect. His behavior is normal. Judgment and thought content normal.    Labs reviewed: Basic Metabolic Panel:  Recent Labs  08/07/15 1041  04/04/16 1126 06/28/16 1157 07/12/16 0920  NA 138  < > 135 137 138  K 4.2  < > 5.1 5.6* 4.6  CL 102  < > 99 112* 112*  CO2 21  < > 21 19* 21  GLUCOSE 125*  < > 324* 111* 63*  BUN 18  < > 25 34* 26*  CREATININE 1.38*  < > 1.54* 1.89* 1.66*  CALCIUM 9.4  < > 9.1 9.0 8.8  TSH 0.691  --   --   --   --   < > = values in this interval not displayed. Liver Function Tests:  Recent Labs  12/29/15 0927 02/25/16 1034 06/28/16 1157  AST 22 28 26   ALT 20 25 31   ALKPHOS 81 74 76  BILITOT 0.4 0.3 0.4  PROT 7.0 7.2 7.6  ALBUMIN 3.4* 3.2* 3.7    Recent Labs  11/03/15 1125  LIPASE 77*   No results for input(s): AMMONIA in the last 8760 hours. CBC:  Recent Labs  08/07/15 1041 11/03/15 1125  02/25/16 1034 03/01/16 0612 06/28/16 1157  WBC 6.0 9.3  < > 5.5 8.1 6.2  NEUTROABS 2.4 5.6  --   --   --   --   HGB  --  15.6  < > 14.0 14.0 14.7  HCT 41.9 44.0  < > 41.4 41.5 42.8  MCV 97 91.7  < > 94.5 96.3 93.4  PLT 157 175  < >  152 152 177  < > = values in this interval not displayed. Lipid Panel:  Recent Labs  08/07/15 1041 12/29/15 0927 07/12/16 0920  CHOL 121 110* 150  HDL 32* 23* 34*  LDLCALC 15 NOT CALC NOT CALC  TRIG 371* 564* 674*  CHOLHDL 3.8 4.8 4.4   TSH:  Recent Labs  08/07/15 1041  TSH 0.691   A1C: Lab Results  Component Value Date   HGBA1C 8.6 (H) 02/25/2016     Assessment/Plan 1. Diarrhea, unspecified type -hx of diverticulosis on colonoscopy from  2013, pt denies any episodes of diverticulitis  -hem  positive stool, will get GI referral, to stop ASA at this time NO NSAIDS -keep hydrated, will get BMP for electrolytes and kidney function - CBC with Differential/Platelets - Basic metabolic panel - Ambulatory referral to Gastroenterology -increase omeprazole to BID - C. difficile, PCR - metroNIDAZOLE (FLAGYL) 500 MG tablet; Take 1 tablet (500 mg total) by mouth 3 (three) times daily.  Dispense: 30 tablet; Refill: 0 to cover for diverticulitis which could be causing bleed or C diff.  -given extensive instructions on when to seek emergency care, pt understands and agrees.   [name]. Harle Battiest  [facility] [phone] 8 am - 5 pm) [phone] (after hours)

## 2016-07-26 NOTE — Patient Instructions (Addendum)
Increase your hydration (cont Pedialyte or Gatorade)  and NO NSAIDS (Aleve, Advil, Motrin, Ibuprofen)   To start flagyl 500 mg every 8 hours for 10 days  Will send to Gastroenterology for further evaluation  Cont to take omeprazole, increase to twice daily   STOP ASA for now  If you start to feel lightheaded, bright red blood, shortness of breath, chest pains Seek immediate medial attention

## 2016-07-27 ENCOUNTER — Other Ambulatory Visit: Payer: Self-pay

## 2016-07-27 DIAGNOSIS — E611 Iron deficiency: Secondary | ICD-10-CM

## 2016-07-27 LAB — CBC WITH DIFFERENTIAL/PLATELET
BASOS PCT: 1 %
Basophils Absolute: 59 cells/uL (ref 0–200)
EOS ABS: 0 {cells}/uL — AB (ref 15–500)
Eosinophils Relative: 0 %
HEMATOCRIT: 40.5 % (ref 38.5–50.0)
HEMOGLOBIN: 13.7 g/dL (ref 13.2–17.1)
LYMPHS ABS: 2183 {cells}/uL (ref 850–3900)
LYMPHS PCT: 37 %
MCH: 31.8 pg (ref 27.0–33.0)
MCHC: 33.8 g/dL (ref 32.0–36.0)
MCV: 94 fL (ref 80.0–100.0)
MONO ABS: 767 {cells}/uL (ref 200–950)
MPV: 12.7 fL — AB (ref 7.5–12.5)
Monocytes Relative: 13 %
Neutro Abs: 2891 cells/uL (ref 1500–7800)
Neutrophils Relative %: 49 %
Platelets: 104 10*3/uL — ABNORMAL LOW (ref 140–400)
RBC: 4.31 MIL/uL (ref 4.20–5.80)
RDW: 14.8 % (ref 11.0–15.0)
WBC: 5.9 10*3/uL (ref 3.8–10.8)

## 2016-07-27 LAB — CLOSTRIDIUM DIFFICILE BY PCR: Toxigenic C. Difficile by PCR: DETECTED — CR

## 2016-07-29 ENCOUNTER — Ambulatory Visit: Payer: Medicare Other | Admitting: Podiatry

## 2016-08-01 ENCOUNTER — Telehealth: Payer: Self-pay | Admitting: *Deleted

## 2016-08-01 ENCOUNTER — Encounter (HOSPITAL_COMMUNITY): Payer: Self-pay | Admitting: Emergency Medicine

## 2016-08-01 ENCOUNTER — Emergency Department (HOSPITAL_COMMUNITY): Payer: Medicare Other

## 2016-08-01 DIAGNOSIS — N179 Acute kidney failure, unspecified: Secondary | ICD-10-CM | POA: Diagnosis present

## 2016-08-01 DIAGNOSIS — E1122 Type 2 diabetes mellitus with diabetic chronic kidney disease: Secondary | ICD-10-CM | POA: Diagnosis present

## 2016-08-01 DIAGNOSIS — H409 Unspecified glaucoma: Secondary | ICD-10-CM | POA: Diagnosis present

## 2016-08-01 DIAGNOSIS — E785 Hyperlipidemia, unspecified: Secondary | ICD-10-CM | POA: Diagnosis not present

## 2016-08-01 DIAGNOSIS — A0472 Enterocolitis due to Clostridium difficile, not specified as recurrent: Secondary | ICD-10-CM | POA: Diagnosis not present

## 2016-08-01 DIAGNOSIS — B20 Human immunodeficiency virus [HIV] disease: Secondary | ICD-10-CM | POA: Diagnosis present

## 2016-08-01 DIAGNOSIS — Z86010 Personal history of colonic polyps: Secondary | ICD-10-CM

## 2016-08-01 DIAGNOSIS — R748 Abnormal levels of other serum enzymes: Secondary | ICD-10-CM | POA: Diagnosis present

## 2016-08-01 DIAGNOSIS — E1140 Type 2 diabetes mellitus with diabetic neuropathy, unspecified: Secondary | ICD-10-CM | POA: Diagnosis present

## 2016-08-01 DIAGNOSIS — Z794 Long term (current) use of insulin: Secondary | ICD-10-CM

## 2016-08-01 DIAGNOSIS — R531 Weakness: Secondary | ICD-10-CM | POA: Diagnosis not present

## 2016-08-01 DIAGNOSIS — Z823 Family history of stroke: Secondary | ICD-10-CM

## 2016-08-01 DIAGNOSIS — G8929 Other chronic pain: Secondary | ICD-10-CM | POA: Diagnosis present

## 2016-08-01 DIAGNOSIS — G629 Polyneuropathy, unspecified: Secondary | ICD-10-CM | POA: Diagnosis present

## 2016-08-01 DIAGNOSIS — J440 Chronic obstructive pulmonary disease with acute lower respiratory infection: Secondary | ICD-10-CM | POA: Diagnosis present

## 2016-08-01 DIAGNOSIS — A419 Sepsis, unspecified organism: Principal | ICD-10-CM | POA: Diagnosis present

## 2016-08-01 DIAGNOSIS — J189 Pneumonia, unspecified organism: Secondary | ICD-10-CM | POA: Diagnosis not present

## 2016-08-01 DIAGNOSIS — N183 Chronic kidney disease, stage 3 (moderate): Secondary | ICD-10-CM | POA: Diagnosis not present

## 2016-08-01 DIAGNOSIS — Z79899 Other long term (current) drug therapy: Secondary | ICD-10-CM

## 2016-08-01 DIAGNOSIS — I5022 Chronic systolic (congestive) heart failure: Secondary | ICD-10-CM | POA: Diagnosis present

## 2016-08-01 DIAGNOSIS — R809 Proteinuria, unspecified: Secondary | ICD-10-CM | POA: Diagnosis not present

## 2016-08-01 DIAGNOSIS — K219 Gastro-esophageal reflux disease without esophagitis: Secondary | ICD-10-CM | POA: Diagnosis present

## 2016-08-01 DIAGNOSIS — I255 Ischemic cardiomyopathy: Secondary | ICD-10-CM | POA: Diagnosis not present

## 2016-08-01 DIAGNOSIS — E43 Unspecified severe protein-calorie malnutrition: Secondary | ICD-10-CM | POA: Diagnosis present

## 2016-08-01 DIAGNOSIS — Z833 Family history of diabetes mellitus: Secondary | ICD-10-CM

## 2016-08-01 DIAGNOSIS — Z888 Allergy status to other drugs, medicaments and biological substances status: Secondary | ICD-10-CM

## 2016-08-01 DIAGNOSIS — Z720 Tobacco use: Secondary | ICD-10-CM | POA: Diagnosis not present

## 2016-08-01 DIAGNOSIS — I130 Hypertensive heart and chronic kidney disease with heart failure and stage 1 through stage 4 chronic kidney disease, or unspecified chronic kidney disease: Secondary | ICD-10-CM | POA: Diagnosis not present

## 2016-08-01 DIAGNOSIS — Z7982 Long term (current) use of aspirin: Secondary | ICD-10-CM

## 2016-08-01 DIAGNOSIS — R404 Transient alteration of awareness: Secondary | ICD-10-CM | POA: Diagnosis not present

## 2016-08-01 DIAGNOSIS — R509 Fever, unspecified: Secondary | ICD-10-CM | POA: Diagnosis not present

## 2016-08-01 DIAGNOSIS — J441 Chronic obstructive pulmonary disease with (acute) exacerbation: Secondary | ICD-10-CM | POA: Diagnosis not present

## 2016-08-01 DIAGNOSIS — R6520 Severe sepsis without septic shock: Secondary | ICD-10-CM | POA: Diagnosis not present

## 2016-08-01 DIAGNOSIS — M109 Gout, unspecified: Secondary | ICD-10-CM | POA: Diagnosis present

## 2016-08-01 DIAGNOSIS — I25810 Atherosclerosis of coronary artery bypass graft(s) without angina pectoris: Secondary | ICD-10-CM | POA: Diagnosis present

## 2016-08-01 DIAGNOSIS — D539 Nutritional anemia, unspecified: Secondary | ICD-10-CM | POA: Diagnosis present

## 2016-08-01 DIAGNOSIS — Z8249 Family history of ischemic heart disease and other diseases of the circulatory system: Secondary | ICD-10-CM

## 2016-08-01 DIAGNOSIS — M549 Dorsalgia, unspecified: Secondary | ICD-10-CM | POA: Diagnosis present

## 2016-08-01 LAB — CBC WITH DIFFERENTIAL/PLATELET
BASOS ABS: 0.1 10*3/uL (ref 0.0–0.1)
Basophils Relative: 1 %
EOS ABS: 0.1 10*3/uL (ref 0.0–0.7)
Eosinophils Relative: 1 %
HCT: 33.2 % — ABNORMAL LOW (ref 39.0–52.0)
Hemoglobin: 11.9 g/dL — ABNORMAL LOW (ref 13.0–17.0)
LYMPHS ABS: 3.1 10*3/uL (ref 0.7–4.0)
LYMPHS PCT: 27 %
MCH: 36.6 pg — ABNORMAL HIGH (ref 26.0–34.0)
MCHC: 35.8 g/dL (ref 30.0–36.0)
MCV: 102.2 fL — ABNORMAL HIGH (ref 78.0–100.0)
MONOS PCT: 17 %
Monocytes Absolute: 1.9 10*3/uL — ABNORMAL HIGH (ref 0.1–1.0)
NEUTROS ABS: 6.2 10*3/uL (ref 1.7–7.7)
Neutrophils Relative %: 54 %
Platelets: 272 10*3/uL (ref 150–400)
RBC: 3.25 MIL/uL — AB (ref 4.22–5.81)
RDW: 14.6 % (ref 11.5–15.5)
WBC: 11.4 10*3/uL — AB (ref 4.0–10.5)

## 2016-08-01 LAB — COMPREHENSIVE METABOLIC PANEL
ALT: 12 U/L — ABNORMAL LOW (ref 17–63)
AST: 21 U/L (ref 15–41)
Albumin: 2.8 g/dL — ABNORMAL LOW (ref 3.5–5.0)
Alkaline Phosphatase: 51 U/L (ref 38–126)
Anion gap: 10 (ref 5–15)
BILIRUBIN TOTAL: 0.7 mg/dL (ref 0.3–1.2)
BUN: 32 mg/dL — AB (ref 6–20)
CHLORIDE: 111 mmol/L (ref 101–111)
CO2: 18 mmol/L — ABNORMAL LOW (ref 22–32)
Calcium: 9 mg/dL (ref 8.9–10.3)
Creatinine, Ser: 2.71 mg/dL — ABNORMAL HIGH (ref 0.61–1.24)
GFR, EST AFRICAN AMERICAN: 26 mL/min — AB (ref >60)
GFR, EST NON AFRICAN AMERICAN: 23 mL/min — AB (ref >60)
Glucose, Bld: 111 mg/dL — ABNORMAL HIGH (ref 65–99)
POTASSIUM: 4.9 mmol/L (ref 3.5–5.1)
Sodium: 139 mmol/L (ref 135–145)
TOTAL PROTEIN: 7.9 g/dL (ref 6.5–8.1)

## 2016-08-01 LAB — URINALYSIS, ROUTINE W REFLEX MICROSCOPIC
Glucose, UA: NEGATIVE mg/dL
HGB URINE DIPSTICK: NEGATIVE
Ketones, ur: 15 mg/dL — AB
NITRITE: POSITIVE — AB
SPECIFIC GRAVITY, URINE: 1.029 (ref 1.005–1.030)
pH: 5 (ref 5.0–8.0)

## 2016-08-01 LAB — URINE MICROSCOPIC-ADD ON

## 2016-08-01 LAB — I-STAT CG4 LACTIC ACID, ED: LACTIC ACID, VENOUS: 1.04 mmol/L (ref 0.5–1.9)

## 2016-08-01 NOTE — Telephone Encounter (Signed)
Appointment scheduled tomorrow with [name].

## 2016-08-01 NOTE — Telephone Encounter (Signed)
Patient called and stated that he has no taste and has no smell this morning. Head congestion and blowing out a lot of brown mucus. Not taking anything but Tylenol. Fever of 100.5. Taking cough syrup all weekend and drinking Gatorade. Please Advise.

## 2016-08-01 NOTE — ED Triage Notes (Signed)
Pt reports fever and generalized body aches ongoing x2 weeks, states fever started today. Pt reports fever 106.5?? Temp in triage 99.2. States tylenol today. Denies N/V.

## 2016-08-01 NOTE — Telephone Encounter (Signed)
He needs to be seen for further recommendations and or treatment

## 2016-08-02 ENCOUNTER — Inpatient Hospital Stay (HOSPITAL_COMMUNITY)
Admission: EM | Admit: 2016-08-02 | Discharge: 2016-08-03 | DRG: 974 | Disposition: A | Discharge status: Home / Self Care | Payer: Medicare Other | Attending: Internal Medicine | Admitting: Internal Medicine

## 2016-08-02 ENCOUNTER — Ambulatory Visit: Payer: Medicare Other | Admitting: Podiatry

## 2016-08-02 ENCOUNTER — Other Ambulatory Visit: Payer: Self-pay

## 2016-08-02 ENCOUNTER — Ambulatory Visit: Payer: Medicare Other | Admitting: Nurse Practitioner

## 2016-08-02 ENCOUNTER — Encounter (HOSPITAL_COMMUNITY): Payer: Self-pay | Admitting: Internal Medicine

## 2016-08-02 DIAGNOSIS — E1142 Type 2 diabetes mellitus with diabetic polyneuropathy: Secondary | ICD-10-CM | POA: Diagnosis present

## 2016-08-02 DIAGNOSIS — J189 Pneumonia, unspecified organism: Secondary | ICD-10-CM | POA: Diagnosis present

## 2016-08-02 DIAGNOSIS — N179 Acute kidney failure, unspecified: Secondary | ICD-10-CM | POA: Diagnosis present

## 2016-08-02 DIAGNOSIS — R197 Diarrhea, unspecified: Secondary | ICD-10-CM

## 2016-08-02 DIAGNOSIS — R6520 Severe sepsis without septic shock: Secondary | ICD-10-CM | POA: Diagnosis present

## 2016-08-02 DIAGNOSIS — G629 Polyneuropathy, unspecified: Secondary | ICD-10-CM | POA: Diagnosis present

## 2016-08-02 DIAGNOSIS — N183 Chronic kidney disease, stage 3 (moderate): Secondary | ICD-10-CM | POA: Diagnosis present

## 2016-08-02 DIAGNOSIS — I2510 Atherosclerotic heart disease of native coronary artery without angina pectoris: Secondary | ICD-10-CM | POA: Diagnosis not present

## 2016-08-02 DIAGNOSIS — I255 Ischemic cardiomyopathy: Secondary | ICD-10-CM | POA: Diagnosis present

## 2016-08-02 DIAGNOSIS — E43 Unspecified severe protein-calorie malnutrition: Secondary | ICD-10-CM | POA: Diagnosis present

## 2016-08-02 DIAGNOSIS — I130 Hypertensive heart and chronic kidney disease with heart failure and stage 1 through stage 4 chronic kidney disease, or unspecified chronic kidney disease: Secondary | ICD-10-CM | POA: Diagnosis present

## 2016-08-02 DIAGNOSIS — E1159 Type 2 diabetes mellitus with other circulatory complications: Secondary | ICD-10-CM | POA: Diagnosis not present

## 2016-08-02 DIAGNOSIS — H409 Unspecified glaucoma: Secondary | ICD-10-CM | POA: Diagnosis present

## 2016-08-02 DIAGNOSIS — E1140 Type 2 diabetes mellitus with diabetic neuropathy, unspecified: Secondary | ICD-10-CM | POA: Diagnosis present

## 2016-08-02 DIAGNOSIS — R748 Abnormal levels of other serum enzymes: Secondary | ICD-10-CM | POA: Diagnosis present

## 2016-08-02 DIAGNOSIS — A419 Sepsis, unspecified organism: Secondary | ICD-10-CM

## 2016-08-02 DIAGNOSIS — I10 Essential (primary) hypertension: Secondary | ICD-10-CM | POA: Diagnosis not present

## 2016-08-02 DIAGNOSIS — I25810 Atherosclerosis of coronary artery bypass graft(s) without angina pectoris: Secondary | ICD-10-CM | POA: Diagnosis present

## 2016-08-02 DIAGNOSIS — Z720 Tobacco use: Secondary | ICD-10-CM | POA: Diagnosis not present

## 2016-08-02 DIAGNOSIS — B20 Human immunodeficiency virus [HIV] disease: Secondary | ICD-10-CM | POA: Diagnosis present

## 2016-08-02 DIAGNOSIS — F1910 Other psychoactive substance abuse, uncomplicated: Secondary | ICD-10-CM | POA: Diagnosis present

## 2016-08-02 DIAGNOSIS — K219 Gastro-esophageal reflux disease without esophagitis: Secondary | ICD-10-CM | POA: Diagnosis present

## 2016-08-02 DIAGNOSIS — J441 Chronic obstructive pulmonary disease with (acute) exacerbation: Secondary | ICD-10-CM | POA: Diagnosis present

## 2016-08-02 DIAGNOSIS — R809 Proteinuria, unspecified: Secondary | ICD-10-CM | POA: Diagnosis present

## 2016-08-02 DIAGNOSIS — A0472 Enterocolitis due to Clostridium difficile, not specified as recurrent: Secondary | ICD-10-CM | POA: Diagnosis present

## 2016-08-02 DIAGNOSIS — J440 Chronic obstructive pulmonary disease with acute lower respiratory infection: Secondary | ICD-10-CM | POA: Diagnosis present

## 2016-08-02 DIAGNOSIS — E1122 Type 2 diabetes mellitus with diabetic chronic kidney disease: Secondary | ICD-10-CM | POA: Diagnosis present

## 2016-08-02 DIAGNOSIS — E785 Hyperlipidemia, unspecified: Secondary | ICD-10-CM | POA: Diagnosis present

## 2016-08-02 DIAGNOSIS — I5022 Chronic systolic (congestive) heart failure: Secondary | ICD-10-CM | POA: Diagnosis present

## 2016-08-02 DIAGNOSIS — M109 Gout, unspecified: Secondary | ICD-10-CM | POA: Diagnosis present

## 2016-08-02 DIAGNOSIS — Z794 Long term (current) use of insulin: Secondary | ICD-10-CM | POA: Diagnosis present

## 2016-08-02 LAB — INFLUENZA PANEL BY PCR (TYPE A & B)
H1N1FLUPCR: NOT DETECTED
INFLAPCR: NEGATIVE
Influenza B By PCR: NEGATIVE

## 2016-08-02 LAB — HEPATIC FUNCTION PANEL
ALBUMIN: 2.5 g/dL — AB (ref 3.5–5.0)
ALK PHOS: 48 U/L (ref 38–126)
ALT: 13 U/L — ABNORMAL LOW (ref 17–63)
AST: 19 U/L (ref 15–41)
BILIRUBIN DIRECT: 0.1 mg/dL (ref 0.1–0.5)
BILIRUBIN TOTAL: 0.5 mg/dL (ref 0.3–1.2)
Indirect Bilirubin: 0.4 mg/dL (ref 0.3–0.9)
Total Protein: 7.1 g/dL (ref 6.5–8.1)

## 2016-08-02 LAB — CBC WITH DIFFERENTIAL/PLATELET
BASOS ABS: 0.1 10*3/uL (ref 0.0–0.1)
BASOS PCT: 1 %
EOS PCT: 1 %
Eosinophils Absolute: 0.1 10*3/uL (ref 0.0–0.7)
HEMATOCRIT: 35 % — AB (ref 39.0–52.0)
HEMOGLOBIN: 12.3 g/dL — AB (ref 13.0–17.0)
LYMPHS ABS: 2.5 10*3/uL (ref 0.7–4.0)
LYMPHS PCT: 27 %
MCH: 35.7 pg — ABNORMAL HIGH (ref 26.0–34.0)
MCHC: 35.1 g/dL (ref 30.0–36.0)
MCV: 101.4 fL — AB (ref 78.0–100.0)
MONOS PCT: 15 %
Monocytes Absolute: 1.4 10*3/uL — ABNORMAL HIGH (ref 0.1–1.0)
NEUTROS ABS: 5 10*3/uL (ref 1.7–7.7)
Neutrophils Relative %: 56 %
Platelets: 265 10*3/uL (ref 150–400)
RBC: 3.45 MIL/uL — ABNORMAL LOW (ref 4.22–5.81)
RDW: 14.4 % (ref 11.5–15.5)
WBC: 9.1 10*3/uL (ref 4.0–10.5)

## 2016-08-02 LAB — BASIC METABOLIC PANEL
ANION GAP: 8 (ref 5–15)
BUN: 30 mg/dL — AB (ref 6–20)
CHLORIDE: 110 mmol/L (ref 101–111)
CO2: 20 mmol/L — AB (ref 22–32)
Calcium: 8.8 mg/dL — ABNORMAL LOW (ref 8.9–10.3)
Creatinine, Ser: 2.13 mg/dL — ABNORMAL HIGH (ref 0.61–1.24)
GFR calc Af Amer: 35 mL/min — ABNORMAL LOW (ref >60)
GFR calc non Af Amer: 30 mL/min — ABNORMAL LOW (ref >60)
GLUCOSE: 147 mg/dL — AB (ref 65–99)
POTASSIUM: 4.7 mmol/L (ref 3.5–5.1)
Sodium: 138 mmol/L (ref 135–145)

## 2016-08-02 LAB — GLUCOSE, CAPILLARY
GLUCOSE-CAPILLARY: 141 mg/dL — AB (ref 65–99)
GLUCOSE-CAPILLARY: 119 mg/dL — AB (ref 65–99)
GLUCOSE-CAPILLARY: 266 mg/dL — AB (ref 65–99)
GLUCOSE-CAPILLARY: 223 mg/dL — AB (ref 65–99)
GLUCOSE-CAPILLARY: 245 mg/dL — AB (ref 65–99)

## 2016-08-02 LAB — EXPECTORATED SPUTUM ASSESSMENT W GRAM STAIN, RFLX TO RESP C

## 2016-08-02 LAB — TROPONIN I
TROPONIN I: 0.05 ng/mL — AB (ref <0.03)
TROPONIN I: 0.03 ng/mL — AB (ref <0.03)
Troponin I: 0.06 ng/mL (ref <0.03)

## 2016-08-02 LAB — STREP PNEUMONIAE URINARY ANTIGEN: STREP PNEUMO URINARY ANTIGEN: NEGATIVE

## 2016-08-02 LAB — EXPECTORATED SPUTUM ASSESSMENT W REFEX TO RESP CULTURE

## 2016-08-02 MED ORDER — ENSURE ENLIVE PO LIQD
237.0000 mL | Freq: Two times a day (BID) | ORAL | Status: DC
  Administered 2016-08-02 (×2): 237 mL via ORAL

## 2016-08-02 MED ORDER — HYDRALAZINE HCL 50 MG PO TABS
25.0000 mg | ORAL_TABLET | Freq: Three times a day (TID) | ORAL | Status: DC
  Administered 2016-08-02 – 2016-08-03 (×5): 25 mg via ORAL
  Filled 2016-08-02 (×5): qty 1

## 2016-08-02 MED ORDER — ATORVASTATIN CALCIUM 10 MG PO TABS
20.0000 mg | ORAL_TABLET | Freq: Every day | ORAL | Status: DC
  Administered 2016-08-02 – 2016-08-03 (×2): 20 mg via ORAL
  Filled 2016-08-02 (×2): qty 2

## 2016-08-02 MED ORDER — PANTOPRAZOLE SODIUM 40 MG PO TBEC
40.0000 mg | DELAYED_RELEASE_TABLET | Freq: Every day | ORAL | Status: DC
  Administered 2016-08-02 – 2016-08-03 (×2): 40 mg via ORAL
  Filled 2016-08-02 (×2): qty 1

## 2016-08-02 MED ORDER — ALBUTEROL SULFATE (2.5 MG/3ML) 0.083% IN NEBU: 2.5000 mg | INHALATION_SOLUTION | RESPIRATORY_TRACT | Status: DC | PRN

## 2016-08-02 MED ORDER — ISOSORBIDE MONONITRATE ER 30 MG PO TB24
30.0000 mg | ORAL_TABLET | Freq: Every day | ORAL | Status: DC
  Administered 2016-08-02 – 2016-08-03 (×2): 30 mg via ORAL
  Filled 2016-08-02 (×2): qty 1

## 2016-08-02 MED ORDER — CEFTRIAXONE SODIUM 1 G IJ SOLR
1.0000 g | INTRAMUSCULAR | Status: DC
Start: 2016-08-03 — End: 2016-08-03
  Administered 2016-08-02: 1 g via INTRAVENOUS
  Filled 2016-08-02 (×2): qty 10

## 2016-08-02 MED ORDER — DEXTROSE 5 % IV SOLN
500.0000 mg | INTRAVENOUS | Status: DC
Start: 2016-08-03 — End: 2016-08-03
  Administered 2016-08-03: 500 mg via INTRAVENOUS
  Filled 2016-08-02 (×2): qty 500

## 2016-08-02 MED ORDER — CARVEDILOL 12.5 MG PO TABS
25.0000 mg | ORAL_TABLET | Freq: Two times a day (BID) | ORAL | Status: DC
  Administered 2016-08-02 – 2016-08-03 (×4): 25 mg via ORAL
  Filled 2016-08-02 (×4): qty 2

## 2016-08-02 MED ORDER — INSULIN ASPART 100 UNIT/ML ~~LOC~~ SOLN
0.0000 [IU] | Freq: Three times a day (TID) | SUBCUTANEOUS | Status: DC
  Administered 2016-08-02: 3 [IU] via SUBCUTANEOUS
  Administered 2016-08-03 (×2): 2 [IU] via SUBCUTANEOUS
  Administered 2016-08-03: 9 [IU] via SUBCUTANEOUS

## 2016-08-02 MED ORDER — ENOXAPARIN SODIUM 40 MG/0.4ML ~~LOC~~ SOLN
40.0000 mg | SUBCUTANEOUS | Status: DC
  Administered 2016-08-02: 40 mg via SUBCUTANEOUS
  Filled 2016-08-02 (×2): qty 0.4

## 2016-08-02 MED ORDER — IPRATROPIUM-ALBUTEROL 0.5-2.5 (3) MG/3ML IN SOLN
3.0000 mL | Freq: Three times a day (TID) | RESPIRATORY_TRACT | Status: DC
  Administered 2016-08-02 – 2016-08-03 (×4): 3 mL via RESPIRATORY_TRACT
  Filled 2016-08-02 (×4): qty 3

## 2016-08-02 MED ORDER — ACETAMINOPHEN 325 MG PO TABS: 650.0000 mg | ORAL_TABLET | Freq: Four times a day (QID) | ORAL | Status: DC | PRN

## 2016-08-02 MED ORDER — ONDANSETRON HCL 4 MG/2ML IJ SOLN: 4.0000 mg | Freq: Four times a day (QID) | INTRAMUSCULAR | Status: DC | PRN

## 2016-08-02 MED ORDER — UMECLIDINIUM BROMIDE 62.5 MCG/INH IN AEPB
1.0000 | INHALATION_SPRAY | Freq: Every day | RESPIRATORY_TRACT | Status: DC
  Administered 2016-08-02: 1 via RESPIRATORY_TRACT
  Filled 2016-08-02: qty 7

## 2016-08-02 MED ORDER — UMECLIDINIUM-VILANTEROL 62.5-25 MCG/INH IN AEPB: 1.0000 | INHALATION_SPRAY | Freq: Every day | RESPIRATORY_TRACT | Status: DC

## 2016-08-02 MED ORDER — PREDNISONE 20 MG PO TABS
40.0000 mg | ORAL_TABLET | Freq: Every day | ORAL | Status: DC
  Administered 2016-08-02 – 2016-08-03 (×2): 40 mg via ORAL
  Filled 2016-08-02 (×2): qty 2

## 2016-08-02 MED ORDER — GLUCERNA SHAKE PO LIQD
237.0000 mL | Freq: Three times a day (TID) | ORAL | Status: DC
  Administered 2016-08-02 – 2016-08-03 (×3): 237 mL via ORAL

## 2016-08-02 MED ORDER — ALLOPURINOL 100 MG PO TABS
100.0000 mg | ORAL_TABLET | Freq: Every day | ORAL | Status: DC
  Administered 2016-08-02 – 2016-08-03 (×2): 100 mg via ORAL
  Filled 2016-08-02 (×2): qty 1

## 2016-08-02 MED ORDER — INSULIN ASPART 100 UNIT/ML ~~LOC~~ SOLN
12.0000 [IU] | Freq: Three times a day (TID) | SUBCUTANEOUS | Status: DC
  Administered 2016-08-02 – 2016-08-03 (×5): 12 [IU] via SUBCUTANEOUS

## 2016-08-02 MED ORDER — ONDANSETRON HCL 4 MG PO TABS: 4.0000 mg | ORAL_TABLET | Freq: Four times a day (QID) | ORAL | Status: DC | PRN

## 2016-08-02 MED ORDER — SODIUM CHLORIDE 0.9 % IV BOLUS (SEPSIS)
1000.0000 mL | Freq: Once | INTRAVENOUS | Status: AC
  Administered 2016-08-02: 1000 mL via INTRAVENOUS

## 2016-08-02 MED ORDER — ACETAMINOPHEN 650 MG RE SUPP: 650.0000 mg | Freq: Four times a day (QID) | RECTAL | Status: DC | PRN

## 2016-08-02 MED ORDER — LATANOPROST 0.005 % OP SOLN
1.0000 [drp] | Freq: Every day | OPHTHALMIC | Status: DC
  Administered 2016-08-02: 1 [drp] via OPHTHALMIC
  Filled 2016-08-02: qty 2.5

## 2016-08-02 MED ORDER — FENOFIBRATE 160 MG PO TABS
160.0000 mg | ORAL_TABLET | Freq: Every day | ORAL | Status: DC
  Administered 2016-08-02 – 2016-08-03 (×2): 160 mg via ORAL
  Filled 2016-08-02 (×2): qty 1

## 2016-08-02 MED ORDER — ARFORMOTEROL TARTRATE 15 MCG/2ML IN NEBU
15.0000 ug | INHALATION_SOLUTION | Freq: Two times a day (BID) | RESPIRATORY_TRACT | Status: DC
  Administered 2016-08-02 – 2016-08-03 (×3): 15 ug via RESPIRATORY_TRACT
  Filled 2016-08-02 (×3): qty 2

## 2016-08-02 MED ORDER — NITROGLYCERIN 0.4 MG SL SUBL
0.3000 mg | SUBLINGUAL_TABLET | SUBLINGUAL | Status: DC | PRN
  Filled 2016-08-02: qty 100

## 2016-08-02 MED ORDER — ASPIRIN EC 81 MG PO TBEC
81.0000 mg | DELAYED_RELEASE_TABLET | Freq: Every day | ORAL | Status: DC
  Administered 2016-08-03: 81 mg via ORAL
  Filled 2016-08-02 (×2): qty 1

## 2016-08-02 MED ORDER — METRONIDAZOLE 500 MG PO TABS
500.0000 mg | ORAL_TABLET | Freq: Three times a day (TID) | ORAL | Status: DC
  Administered 2016-08-02 – 2016-08-03 (×4): 500 mg via ORAL
  Filled 2016-08-02 (×4): qty 1

## 2016-08-02 MED ORDER — DEXTROSE 5 % IV SOLN
500.0000 mg | Freq: Once | INTRAVENOUS | Status: AC
  Administered 2016-08-02: 500 mg via INTRAVENOUS
  Filled 2016-08-02: qty 500

## 2016-08-02 MED ORDER — DEXTROSE 5 % IV SOLN
1.0000 g | Freq: Once | INTRAVENOUS | Status: AC
  Administered 2016-08-02: 1 g via INTRAVENOUS
  Filled 2016-08-02: qty 10

## 2016-08-02 MED ORDER — ELVITEG-COBIC-EMTRICIT-TENOFAF 150-150-200-10 MG PO TABS
1.0000 | ORAL_TABLET | Freq: Every day | ORAL | Status: DC
  Administered 2016-08-02 – 2016-08-03 (×2): 1 via ORAL
  Filled 2016-08-02 (×2): qty 1

## 2016-08-02 MED ORDER — GABAPENTIN 300 MG PO CAPS
300.0000 mg | ORAL_CAPSULE | Freq: Three times a day (TID) | ORAL | Status: DC
  Administered 2016-08-02 – 2016-08-03 (×5): 300 mg via ORAL
  Filled 2016-08-02 (×5): qty 1

## 2016-08-02 MED ORDER — INSULIN GLARGINE 100 UNIT/ML ~~LOC~~ SOLN
70.0000 [IU] | Freq: Every day | SUBCUTANEOUS | Status: DC
  Administered 2016-08-02 – 2016-08-03 (×2): 70 [IU] via SUBCUTANEOUS
  Filled 2016-08-02 (×2): qty 0.7

## 2016-08-02 MED ORDER — COLCHICINE 0.6 MG PO TABS: 0.6000 mg | ORAL_TABLET | Freq: Every day | ORAL | Status: DC | PRN

## 2016-08-02 NOTE — H&P (Addendum)
.  History and Physical    Jacob [name] Jacob [name] DOB: [birth year] DOA: 08/02/2016  PCP: Gildardo Cranker, DO  Patient coming from: Home   Chief Complaint: Generalized weakness, shortness of breath   HPI: Jacob [name] is a 65 y.o. male with a past medical hx of CHF, DM type 2, HIV, drug abuse ,GERD, HTN, HLD, and tobacco abuse presents with complaints of generalized weakness and shortness of breath. Patient has been having shortness of breath over the last few days with exertion and productive cough. He also has some pleuritic type of chest pain. Patient states while at home patient reported high-temperature and came to the ER. Chest x-ray shows possible pneumonia and on exam patient has bilateral wheezing. Labs also reveal acute on chronic renal failure. While in the ED, he received 1L of fluid bolus. Started on ceftriaxone and Zithromax.. Admitted for further treatment of community acquired pneumonia. Patient states he did have some diarrhea for 3 days which has been self-limited and has not had any diarrhea last 24 hours.  ED Course: Initial troponin 0.05. UA indicative of infection. Creatinine 2.71, WBC 11.4. Started on ceftriaxone and Zithromax.   Review of Systems: As per HPI, rest all negative.   Past Medical History:  Diagnosis Date  . Arthritis   . Cataracts, bilateral    immature  . CHF (congestive heart failure) (HCC)    not on any meds  . Chronic back pain    stenosis  . Chronic kidney disease, unspecified   . Coronary atherosclerosis of artery bypass graft   . Coronary atherosclerosis of native coronary artery 2005   s/p surgery  . Drug abuse    hx; tested for cocaine as recently as 2/08. says he is not using drugs now - avoided defib. for this reason   . GERD (gastroesophageal reflux disease)    takes OTC meds as needed  . Glaucoma    uses eye drops daily  . Gout, unspecified    takes Allopurinol daily as well as Colchicine-if needed  . Hepatitis 1967   Hep  C  . History of colon polyps    benign  . HTN (hypertension)    takes Coreg,Imdur.and Apresoline daily  . Human immunodeficiency virus (HIV) disease    takes Genvoya daily  . Hyperlipidemia    takes Atorvastatin daily  . Ischemic cardiomyopathy   . Muscle spasm    takes Zanaflex as needed  . Nocturia   . Peripheral neuropathy (HCC)    takes gabapentin daily  . Pneumonia    hx of  . Shortness of breath dyspnea    rarely but if notices it then with exertion  . Syphilis, unspecified   . Type II or unspecified type diabetes mellitus without mention of complication, not stated as uncontrolled 2004   Lantus daily.Average fasting blood sugar 125-199    Past Surgical History:  Procedure Laterality Date  . COLONOSCOPY  2013   Dr.John Henrene Pastor   . CORONARY ARTERY BYPASS GRAFT    . laproscopic cholecystectomy  8/07  . left intertrochanteric hip fracture  2004   s/p intermedullary nail placement 2/08  . left transverse mandibular fracture  10/05  . LUMBAR LAMINECTOMY/DECOMPRESSION MICRODISCECTOMY N/A 02/29/2016   Procedure: Left L4-5 Lateral Recess Decompression, Removal Extradural Intraspinal Facet Cyst;  Surgeon: Marybelle Killings, MD;  Location: [facility];  Service: Orthopedics;  Laterality: N/A;     reports that he has been smoking Cigarettes.  He has a 20.50 pack-year smoking  history. He has never used smokeless tobacco. He reports that he drinks about 3.6 oz of alcohol per week . He reports that he does not use drugs.  Allergies  Allergen Reactions  . Amphetamines Other (See Comments)    Pt is unaware of this -     Family History  Problem Relation Age of Onset  . Heart failure Father   . Hypertension Father   . Diabetes Brother   . Heart attack Brother   . Alzheimer's disease Mother   . Stroke Sister   . Diabetes Sister   . Alzheimer's disease Sister   . Hypertension Brother   . Diabetes Brother   . Drug abuse Brother   . Colon cancer Neg Hx     Prior to Admission medications    Medication Sig Start Date End Date Taking? Authorizing Provider  allopurinol (ZYLOPRIM) 100 MG tablet Take 1 tablet (100 mg total) by mouth daily. 07/15/16  Yes Gildardo Cranker, DO  atorvastatin (LIPITOR) 20 MG tablet Take 1 tablet (20 mg total) by mouth daily. 07/15/16  Yes Gildardo Cranker, DO  carvedilol (COREG) 25 MG tablet Take 1 tablet (25 mg total) by mouth 2 (two) times daily with a meal. 11/19/15  Yes [name], MD  Choline Fenofibrate 135 MG capsule Take 1 capsule (135 mg total) by mouth daily. 07/15/16  Yes Gildardo Cranker, DO  colchicine 0.6 MG tablet Take 0.6 mg by mouth daily as needed. Reported on 04/04/2016   Yes Historical Provider, MD  elvitegravir-cobicistat-emtricitabine-tenofovir (GENVOYA) 150-150-200-10 MG TABS tablet TAKE 1 TABLET BY MOUTH DAILY WITH BREAKFAST. 01/12/16  Yes [name], MD  gabapentin (NEURONTIN) 300 MG capsule Take 1 capsule (300 mg total) by mouth 3 (three) times daily. 07/25/16  Yes Gildardo Cranker, DO  Glucosamine-Chondroit-Vit C-Mn (GLUCOSAMINE CHONDR 1500 COMPLX PO) Take 2 tablets by mouth daily. Reported on 04/04/2016   Yes Historical Provider, MD  hydrALAZINE (APRESOLINE) 25 MG tablet Take 1 tablet (25 mg total) by mouth 3 (three) times daily. 11/19/15  Yes [name], MD  Insulin Glargine (LANTUS SOLOSTAR) 100 UNIT/ML Solostar Pen Inject 70 Units into the skin daily. E11.22 03/30/16  Yes Monica [name], DO  insulin lispro (HUMALOG KWIKPEN) 100 UNIT/ML KiwkPen Inject 0.12 mLs (12 Units total) into the skin 3 (three) times daily. Before Meals DX E11.22 04/25/16  Yes Gildardo Cranker, DO  isosorbide mononitrate (IMDUR) 30 MG 24 hr tablet Take 30 mg by mouth daily. Reported on 04/04/2016   Yes Historical Provider, MD  latanoprost (XALATAN) 0.005 % ophthalmic solution Place 1 drop into both eyes at bedtime. Reported on 04/04/2016 04/23/12  Yes Historical Provider, MD  metroNIDAZOLE (FLAGYL) 500 MG tablet Take 1 tablet (500 mg total) by mouth 3 (three) times daily. 07/26/16   Yes [name], NP  Naftifine HCl 2 % CREA Apply to affected area two times a day 09/01/15  Yes Max T Hyatt, DPM  nitroGLYCERIN (NITROSTAT) 0.3 MG SL tablet Place 1 tablet (0.3 mg total) under the tongue as needed for chest pain (not to exceed 3 tablets in one day.). 02/24/16  Yes [name], MD  omeprazole (PRILOSEC) 20 MG capsule Take 1 capsule (20 mg total) by mouth 2 (two) times daily before a meal. 07/26/16  Yes [name], NP  umeclidinium-vilanterol (ANORO ELLIPTA) 62.5-25 MCG/INH AEPB Inhale 1 puff into the lungs daily. 07/15/16  Yes Gildardo Cranker, DO  B-D UF III MINI PEN NEEDLES 31G X 5 MM MISC USE 4 TIMES  DAILY 05/24/16   Gildardo Cranker, DO  glucose blood test strip Check BS tice daily as instructed. 02/11/16   Gildardo Cranker, DO  ONE TOUCH LANCETS MISC Check BS twice daily. 02/11/16   Gildardo Cranker, DO    Physical Exam: Vitals:   08/02/16 0115 08/02/16 0130 08/02/16 0230 08/02/16 0300  BP: 150/79 161/86 159/88 175/94  Pulse: 92 91 92 90  Resp:   (!) 31 (!) 28  Temp:      TempSrc:      SpO2: 93% 95% 96% 96%  Weight:      Height:         Vitals:   08/02/16 0115 08/02/16 0130 08/02/16 0230 08/02/16 0300  BP: 150/79 161/86 159/88 175/94  Pulse: 92 91 92 90  Resp:   (!) 31 (!) 28  Temp:      TempSrc:      SpO2: 93% 95% 96% 96%  Weight:      Height:       Constitutional: Appears calm and comfortable, well built And moderately built Eyes: Anicteric, no pallor  ENMT: No discharge from the ears, eyes, nose, and mouth  Neck: No JVD, no neck rigidity Respiratory: Mild wheeze. No rhonchi Cardiovascular: No murmurs, rubs, or gallops  Abdomen: Soft, non tender, and non distended  Musculoskeletal: good ROM, moves all extremities  Skin: No rashes, lesions, or ulcers  Neurological: CN 2-12 grossly intact Psychiatric: Judgement and insight intact, oriented x3.    Labs on Admission: I have personally reviewed following labs and imaging studies  CBC:  Recent  Labs Lab 07/26/16 1548 08/01/16 2107  WBC 5.9 11.4*  NEUTROABS 2,891 6.2  HGB 13.7 11.9*  HCT 40.5 33.2*  MCV 94.0 102.2*  PLT 104* 426   Basic Metabolic Panel:  Recent Labs Lab 08/01/16 2107  NA 139  K 4.9  CL 111  CO2 18*  GLUCOSE 111*  BUN 32*  CREATININE 2.71*  CALCIUM 9.0   GFR: Estimated Creatinine Clearance: 30.8 mL/[name] (by C-G formula based on SCr of 2.71 mg/dL (H)). Liver Function Tests:  Recent Labs Lab 08/01/16 2107  AST 21  ALT 12*  ALKPHOS 51  BILITOT 0.7  PROT 7.9  ALBUMIN 2.8*   No results for input(s): LIPASE, AMYLASE in the last 168 hours. No results for input(s): AMMONIA in the last 168 hours. Coagulation Profile: No results for input(s): INR, PROTIME in the last 168 hours. Cardiac Enzymes:  Recent Labs Lab 08/02/16 0135  TROPONINI 0.05*   BNP (last 3 results) No results for input(s): PROBNP in the last 8760 hours. HbA1C: No results for input(s): HGBA1C in the last 72 hours. CBG: No results for input(s): GLUCAP in the last 168 hours. Lipid Profile: No results for input(s): CHOL, HDL, LDLCALC, TRIG, CHOLHDL, LDLDIRECT in the last 72 hours. Thyroid Function Tests: No results for input(s): TSH, T4TOTAL, FREET4, T3FREE, THYROIDAB in the last 72 hours. Anemia Panel: No results for input(s): VITAMINB12, FOLATE, FERRITIN, TIBC, IRON, RETICCTPCT in the last 72 hours. Urine analysis:    Component Value Date/Time   COLORURINE AMBER (A) 08/01/2016 2113   APPEARANCEUR TURBID (A) 08/01/2016 2113   LABSPEC 1.029 08/01/2016 2113   PHURINE 5.0 08/01/2016 2113   GLUCOSEU NEGATIVE 08/01/2016 2113   GLUCOSEU NEG mg/dL 09/20/2010 2058   HGBUR NEGATIVE 08/01/2016 2113   HGBUR negative 05/31/2010 0948   BILIRUBINUR SMALL (A) 08/01/2016 2113   KETONESUR 15 (A) 08/01/2016 2113   PROTEINUR >300 (A) 08/01/2016 2113   UROBILINOGEN 0.2  06/24/2015 2336   NITRITE POSITIVE (A) 08/01/2016 2113   LEUKOCYTESUR SMALL (A) 08/01/2016 2113   Sepsis  Labs: @LABRCNTIP (procalcitonin:4,lacticidven:4) ) Recent Results (from the past 240 hour(s))  Clostridium Difficile by PCR     Status: Abnormal   Collection Time: 07/26/16  3:48 PM  Result Value Ref Range Status   Toxigenic C Difficile by pcr Detected (AA) Not Detected Final    Comment: This test is for use only with liquid or soft stools; performance characteristics of other clinical specimen types have not been established.   This assay was performed by Cepheid GeneXpert(R) PCR. The performance characteristics of this assay have been determined by [employer]. Performance characteristics refer to the analytical performance of the test.      Radiological Exams on Admission: Dg Chest 2 View  Result Date: 08/01/2016 CLINICAL DATA:  Fever, generalized body aches for 2 weeks. Congestion. EXAM: CHEST  2 VIEW COMPARISON:  11/03/2015 FINDINGS: Diffuse bronchial thickening with streaky perihilar opacities. More confluent opacities seen posteriorly on the lateral view, likely localizing to the left upper lobe and superior segment right lower lobe. Patient is post median sternotomy. Heart size and mediastinal contours are unchanged. There is atherosclerosis of the thoracic aorta. No pleural fluid. Remote right rib fractures. IMPRESSION: Bronchial thickening suggestive of acute bronchitis. There is superimposed confluent perihilar opacities likely localizing to the left upper lobe and superior right lower lobe concerning for pneumonia. Followup PA and lateral chest X-ray is recommended in 3-4 weeks following trial of antibiotic therapy to ensure resolution and exclude underlying malignancy. Electronically Signed   By: Jeb Levering M.D.   On: 08/01/2016 21:54    EKG: Independently reviewed. EKG shows sinus rhythm.   Assessment/Plan Active Problems:   Severe sepsis ([place])    1. Community acquired pneumonia - Probably community acquired since patients last CD4 was 1300 last month,  will check influenza PCR.Will follow cultures. For now patient is on ceftriaxone and Primaxin.  2. Bronchitis - He has wheezes on exam. Will continue nebs, if wheeze continues will place on steroids.   3. Chest pain -  Elevated troponin. Pleuritic type of chest pain probably from pneumonia. Patient has an elevated troponin of 0.05. Will cycle cardiac markers. Patient is on aspirin and statins and Coreg. Speech is pain-free. 4. Acute on chronic renal failure stage III - probably from recent diarrhea. Received 1L of fluid bolus in the ED. Given hx of CHF will hold further fluids. Follow metabolic.  5. HIV - Continue current treatment. Last CD4 of 1,330 last month. Continue antiretroviral.  6. History of ischemic cardiomyopathy - appears compensated. Closely observe respiratory status. 7. DM type 2. Continue SSI, and lantus.  8. HTN - Continue coreg Hydralzine. 9. HLD - Continue statins  10. History of IV drug abuse. Check urine drug screen.  11. Tobacco abuse - Counseled on the importance of cessation.   UA looks infected. Patient is asymptomatic. Will follow cultures. Patient is on ceftriaxone.   DVT prophylaxis:  Lovenox. Code Status: Full  Family Communication: No family bedside Disposition Plan: Discharge home once improved.  Consults called: None  Admission status: Inpatient  likely stay 2 days.    Laurey Morale, MD  [facility] If 7PM-7AM, please contact night-coverage [email or web address] Password [email or web address]  08/02/2016, 4:00 AM     By signing my name below, I, Collene Leyden, attest that this documentation has been prepared under the direction and in the presence of Gean Birchwood, MD. Electronically signed: Collene Leyden,  Scribe. 08/02/16 4:43 AM

## 2016-08-02 NOTE — Progress Notes (Signed)
New Admission Note:  Arrival Method: Stretcher Mental Orientation: A&O x4 Telemetry: [address], CCMD notified Assessment: Completed Skin: Assessed with Mady Gemma, RN, check flowsheet IV: Right forearm, SL Pain: 0/10 Tubes: N/A Safety Measures: Safety Fall Prevention Plan discussed with patient. Admission: Completed [address] Orientation: Patient has been orientated to the room, unit and the staff. Family: None a bedside.  Orders have been reviewed and implemented. Will continue to monitor the patient. Call light has been placed within reach. Pt placed on droplet precautions.  Nena Polio BSN, RN  Phone Number: [phone]

## 2016-08-02 NOTE — ED Provider Notes (Signed)
[facility] DEPT Provider Note   CSN: [id number] Arrival date & time: 08/01/16  2057   By signing my name below, I, [name], attest that this documentation has been prepared under the direction and in the presence of Varney Biles, MD  Electronically Signed: [name], ED Scribe. 08/02/16. 1:49 AM.   History   Chief Complaint Chief Complaint  Patient presents with  . Fever  . Generalized Body Aches    The history is provided by the patient. No language interpreter was used.   HPI Comments:  Jacob YURCHAK is a 65 y.o. male, with a hx of CHF, DM, and HIV, who presents to the Emergency Department complaining of generalized weakness ~ 1 week ago. Pt notes he had a fever with a TMAX 106.5 today and a cough. He has taken tylenol with little relief. He denies current fever, chest pain, diarrhea and frequency. Pt states visited his PCP and was prescribed antibiotics ~ 1 week ago but did not start to take them until 4 days ago. He notes he received his flu shot on 07/15/16 and was prescribed his triglyceride medication on this day as well. Pt denies any other complaints at this time.   Past Medical History:  Diagnosis Date  . Arthritis   . Cataracts, bilateral    immature  . CHF (congestive heart failure) (HCC)    not on any meds  . Chronic back pain    stenosis  . Chronic kidney disease, unspecified   . Coronary atherosclerosis of artery bypass graft   . Coronary atherosclerosis of native coronary artery 2005   s/p surgery  . Drug abuse    hx; tested for cocaine as recently as 2/08. says he is not using drugs now - avoided defib. for this reason   . GERD (gastroesophageal reflux disease)    takes OTC meds as needed  . Glaucoma    uses eye drops daily  . Gout, unspecified    takes Allopurinol daily as well as Colchicine-if needed  . Hepatitis 1967   Hep C  . History of colon polyps    benign  . HTN (hypertension)    takes Coreg,Imdur.and Apresoline daily  .  Human immunodeficiency virus (HIV) disease    takes Genvoya daily  . Hyperlipidemia    takes Atorvastatin daily  . Ischemic cardiomyopathy   . Muscle spasm    takes Zanaflex as needed  . Nocturia   . Peripheral neuropathy (HCC)    takes gabapentin daily  . Pneumonia    hx of  . Shortness of breath dyspnea    rarely but if notices it then with exertion  . Syphilis, unspecified   . Type II or unspecified type diabetes mellitus without mention of complication, not stated as uncontrolled 2004   Lantus daily.Average fasting blood sugar 125-199    Patient Active Problem List   Diagnosis Date Noted  . Protein-calorie malnutrition, severe 08/03/2016  . Severe sepsis ([place]) 08/02/2016  . Pneumonia 08/02/2016  . History of lumbar laminectomy for spinal cord decompression 02/29/2016  . Renal failure (ARF), acute on chronic (HCC) 11/03/2015  . Dehydration 11/03/2015  . Alcohol dependence ([place]) 11/03/2015  . Type 2 diabetes mellitus with vascular disease ([place]) 06/17/2015  . Acute renal failure ([place]) 04/26/2015  . HTN (hypertension) 04/26/2015  . Syncope 04/26/2015  . DM (diabetes mellitus) ([place]) 04/26/2015  . Ischemic cardiomyopathy 05/12/2014  . Diarrhea 09/08/2013  . Shock ([place]) 09/06/2013  . Ulcer of lower extremity ([place]) 09/06/2013  .  Community acquired pneumonia 09/06/2013  . Hyponatremia 09/06/2013  . Hyperkalemia 09/06/2013  . AKI (acute kidney injury) ([place]) 09/06/2013  . Hyperglycemia 09/06/2013  . Elevated transaminase level 09/06/2013  . Chest pain 09/06/2013  . Type II or unspecified type diabetes mellitus with unspecified complication, not stated as uncontrolled 10/22/2012  . Black stool 04/17/2012  . Bunion of left foot 11/25/2011  . Bunion, right foot 11/25/2011  . Polysubstance abuse 07/28/2011  . Hip fracture, left ([place]) 04/04/2011  . Closed fracture of neck of femur ([place]) 04/04/2011  . Insomnia 02/18/2011  . CAD (coronary artery disease) 04/20/2009  . Chronic  combined systolic and diastolic congestive heart failure ([place]) 04/20/2009  . HLD (hyperlipidemia) 11/20/2006  . Gout 11/20/2006  . TOBACCO ABUSE 11/20/2006  . Essential hypertension 11/20/2006  . Human immunodeficiency virus (HIV) disease ([place]) 09/02/2006  . SYPHILIS 09/02/2006    Past Surgical History:  Procedure Laterality Date  . COLONOSCOPY  2013   Dr.John Henrene Pastor   . CORONARY ARTERY BYPASS GRAFT    . laproscopic cholecystectomy  8/07  . left intertrochanteric hip fracture  2004   s/p intermedullary nail placement 2/08  . left transverse mandibular fracture  10/05  . LUMBAR LAMINECTOMY/DECOMPRESSION MICRODISCECTOMY N/A 02/29/2016   Procedure: Left L4-5 Lateral Recess Decompression, Removal Extradural Intraspinal Facet Cyst;  Surgeon: Marybelle Killings, MD;  Location: [facility];  Service: Orthopedics;  Laterality: N/A;     Home Medications    Prior to Admission medications   Medication Sig Start Date End Date Taking? Authorizing Provider  allopurinol (ZYLOPRIM) 100 MG tablet Take 1 tablet (100 mg total) by mouth daily. 07/15/16  Yes Gildardo Cranker, DO  atorvastatin (LIPITOR) 20 MG tablet Take 1 tablet (20 mg total) by mouth daily. 07/15/16  Yes Gildardo Cranker, DO  carvedilol (COREG) 25 MG tablet Take 1 tablet (25 mg total) by mouth 2 (two) times daily with a meal. 11/19/15  Yes [name], MD  Choline Fenofibrate 135 MG capsule Take 1 capsule (135 mg total) by mouth daily. 07/15/16  Yes Gildardo Cranker, DO  colchicine 0.6 MG tablet Take 0.6 mg by mouth daily as needed. Reported on 04/04/2016   Yes Historical Provider, MD  elvitegravir-cobicistat-emtricitabine-tenofovir (GENVOYA) 150-150-200-10 MG TABS tablet TAKE 1 TABLET BY MOUTH DAILY WITH BREAKFAST. 01/12/16  Yes [name], MD  gabapentin (NEURONTIN) 300 MG capsule Take 1 capsule (300 mg total) by mouth 3 (three) times daily. 07/25/16  Yes Gildardo Cranker, DO  Glucosamine-Chondroit-Vit C-Mn (GLUCOSAMINE CHONDR 1500 COMPLX PO) Take 2 tablets by  mouth daily. Reported on 04/04/2016   Yes Historical Provider, MD  hydrALAZINE (APRESOLINE) 25 MG tablet Take 1 tablet (25 mg total) by mouth 3 (three) times daily. 11/19/15  Yes [name], MD  Insulin Glargine (LANTUS SOLOSTAR) 100 UNIT/ML Solostar Pen Inject 70 Units into the skin daily. E11.22 03/30/16  Yes Monica [name], DO  insulin lispro (HUMALOG KWIKPEN) 100 UNIT/ML KiwkPen Inject 0.12 mLs (12 Units total) into the skin 3 (three) times daily. Before Meals DX E11.22 04/25/16  Yes Gildardo Cranker, DO  isosorbide mononitrate (IMDUR) 30 MG 24 hr tablet Take 30 mg by mouth daily. Reported on 04/04/2016   Yes Historical Provider, MD  latanoprost (XALATAN) 0.005 % ophthalmic solution Place 1 drop into both eyes at bedtime. Reported on 04/04/2016 04/23/12  Yes Historical Provider, MD  Naftifine HCl 2 % CREA Apply to affected area two times a day 09/01/15  Yes Max T Hyatt, DPM  nitroGLYCERIN (NITROSTAT) 0.3 MG  SL tablet Place 1 tablet (0.3 mg total) under the tongue as needed for chest pain (not to exceed 3 tablets in one day.). 02/24/16  Yes [name], MD  omeprazole (PRILOSEC) 20 MG capsule Take 1 capsule (20 mg total) by mouth 2 (two) times daily before a meal. 07/26/16  Yes [name], NP  umeclidinium-vilanterol (ANORO ELLIPTA) 62.5-25 MCG/INH AEPB Inhale 1 puff into the lungs daily. 07/15/16  Yes Gildardo Cranker, DO  aspirin EC 81 MG EC tablet Take 1 tablet (81 mg total) by mouth daily. 08/04/16   Jennifer Chahn-Yang Choi, DO  azithromycin (ZITHROMAX) 500 MG tablet Take 1 tablet (500 mg total) by mouth daily. 08/03/16 08/08/16  Shon Millet, DO  B-D UF III MINI PEN NEEDLES 31G X 5 MM MISC USE 4 TIMES DAILY 05/24/16   Gildardo Cranker, DO  glucose blood test strip Check BS tice daily as instructed. 02/11/16   Gildardo Cranker, DO  metroNIDAZOLE (FLAGYL) 500 MG tablet Take 1 tablet (500 mg total) by mouth 3 (three) times daily. 08/03/16 08/05/16  Jennifer Chahn-Yang Choi, DO  ONE TOUCH LANCETS MISC  Check BS twice daily. 02/11/16   Gildardo Cranker, DO  predniSONE (DELTASONE) 10 MG tablet Take 4 tabs for 3 days, then 3 tabs for 3 days, then 2 tabs for 3 days, then 1 tab for 3 days, then 1/2 tab for 4 days. 08/03/16   Shon Millet, DO    Family History Family History  Problem Relation Age of Onset  . Heart failure Father   . Hypertension Father   . Diabetes Brother   . Heart attack Brother   . Alzheimer's disease Mother   . Stroke Sister   . Diabetes Sister   . Alzheimer's disease Sister   . Hypertension Brother   . Diabetes Brother   . Drug abuse Brother   . Colon cancer Neg Hx     Social History Social History  Substance Use Topics  . Smoking status: Current Every Day Smoker    Packs/day: 0.50    Years: 41.00    Types: Cigarettes  . Smokeless tobacco: Never Used  . Alcohol use 3.6 oz/week    6 Standard drinks or equivalent per week     Comment: Drinks 4-5 beers on weekends.      Allergies   Amphetamines   Review of Systems Review of Systems  Constitutional: Negative for fever.  Respiratory: Positive for cough.   Cardiovascular: Negative for chest pain.  Gastrointestinal: Negative for diarrhea.  Genitourinary: Negative for frequency.  Neurological: Positive for weakness.   10 Systems reviewed and are negative for acute change except as noted in the HPI.  Physical Exam Updated Vital Signs BP (!) 148/71 (BP Location: Left Arm)   Pulse 79   Temp 98.3 F (36.8 C) (Oral)   Resp 16   Ht 6\' 2"  (1.88 m)   Wt 191 lb 14.4 oz (87 kg)   SpO2 96%   BMI 24.64 kg/m   Physical Exam  Constitutional: He is oriented to person, place, and time. He appears well-developed and well-nourished.  HENT:  Head: Normocephalic.  Eyes: EOM are normal.  Neck: Normal range of motion.  Cardiovascular: Normal rate and regular rhythm.   Pulmonary/Chest: Effort normal. He has wheezes.  Mild inspiratory wheezing; L worse than R.   Abdominal: He exhibits no distension.    Musculoskeletal: Normal range of motion.  Neurological: He is alert and oriented to person, place, and time.  Psychiatric:  He has a normal mood and affect.  Nursing note and vitals reviewed.  ED Treatments / Results  DIAGNOSTIC STUDIES:  Oxygen Saturation is 95% on RA, normal by my interpretation.    COORDINATION OF CARE:  1:45 AM Discussed treatment plan with pt at bedside and pt agreed to plan.  Labs (all labs ordered are listed, but only abnormal results are displayed) Labs Reviewed  COMPREHENSIVE METABOLIC PANEL - Abnormal; Notable for the following:       Result Value   CO2 18 (*)    Glucose, Bld 111 (*)    BUN 32 (*)    Creatinine, Ser 2.71 (*)    Albumin 2.8 (*)    ALT 12 (*)    GFR calc non Af Amer 23 (*)    GFR calc Af Amer 26 (*)    All other components within normal limits  URINALYSIS, ROUTINE W REFLEX MICROSCOPIC (NOT AT [facility]) - Abnormal; Notable for the following:    Color, Urine AMBER (*)    APPearance TURBID (*)    Bilirubin Urine SMALL (*)    Ketones, ur 15 (*)    Protein, ur >300 (*)    Nitrite POSITIVE (*)    Leukocytes, UA SMALL (*)    All other components within normal limits  CBC WITH DIFFERENTIAL/PLATELET - Abnormal; Notable for the following:    WBC 11.4 (*)    RBC 3.25 (*)    Hemoglobin 11.9 (*)    HCT 33.2 (*)    MCV 102.2 (*)    MCH 36.6 (*)    Monocytes Absolute 1.9 (*)    All other components within normal limits  URINE MICROSCOPIC-ADD ON - Abnormal; Notable for the following:    Squamous Epithelial / LPF 0-5 (*)    Bacteria, UA MANY (*)    Casts HYALINE CASTS (*)    All other components within normal limits  TROPONIN I - Abnormal; Notable for the following:    Troponin I 0.05 (*)    All other components within normal limits  TROPONIN I - Abnormal; Notable for the following:    Troponin I 0.06 (*)    All other components within normal limits  TROPONIN I - Abnormal; Notable for the following:    Troponin I 0.03 (*)    All other  components within normal limits  BASIC METABOLIC PANEL - Abnormal; Notable for the following:    CO2 20 (*)    Glucose, Bld 147 (*)    BUN 30 (*)    Creatinine, Ser 2.13 (*)    Calcium 8.8 (*)    GFR calc non Af Amer 30 (*)    GFR calc Af Amer 35 (*)    All other components within normal limits  CBC WITH DIFFERENTIAL/PLATELET - Abnormal; Notable for the following:    RBC 3.45 (*)    Hemoglobin 12.3 (*)    HCT 35.0 (*)    MCV 101.4 (*)    MCH 35.7 (*)    Monocytes Absolute 1.4 (*)    All other components within normal limits  HEPATIC FUNCTION PANEL - Abnormal; Notable for the following:    Albumin 2.5 (*)    ALT 13 (*)    All other components within normal limits  HIV ANTIBODY (ROUTINE TESTING) - Abnormal; Notable for the following:    HIV Screen 4th Generation wRfx Comment (*)    All other components within normal limits  GLUCOSE, CAPILLARY - Abnormal; Notable for the following:  Glucose-Capillary 141 (*)    All other components within normal limits  GLUCOSE, CAPILLARY - Abnormal; Notable for the following:    Glucose-Capillary 119 (*)    All other components within normal limits  GLUCOSE, CAPILLARY - Abnormal; Notable for the following:    Glucose-Capillary 266 (*)    All other components within normal limits  GLUCOSE, CAPILLARY - Abnormal; Notable for the following:    Glucose-Capillary 223 (*)    All other components within normal limits  GLUCOSE, CAPILLARY - Abnormal; Notable for the following:    Glucose-Capillary 245 (*)    All other components within normal limits  GLUCOSE, CAPILLARY - Abnormal; Notable for the following:    Glucose-Capillary 198 (*)    All other components within normal limits  GLUCOSE, CAPILLARY - Abnormal; Notable for the following:    Glucose-Capillary 186 (*)    All other components within normal limits  CBC WITH DIFFERENTIAL/PLATELET - Abnormal; Notable for the following:    RBC 3.61 (*)    Hemoglobin 12.0 (*)    HCT 34.4 (*)    All  other components within normal limits  BASIC METABOLIC PANEL - Abnormal; Notable for the following:    Sodium 134 (*)    Potassium 5.5 (*)    CO2 20 (*)    Glucose, Bld 221 (*)    BUN 35 (*)    Creatinine, Ser 1.90 (*)    GFR calc non Af Amer 35 (*)    GFR calc Af Amer 40 (*)    All other components within normal limits  HEMOGLOBIN A1C - Abnormal; Notable for the following:    Hgb A1c MFr Bld 7.3 (*)    All other components within normal limits  GLUCOSE, CAPILLARY - Abnormal; Notable for the following:    Glucose-Capillary 315 (*)    All other components within normal limits  HIV 1/2 AB DIFFERENTIATION - Abnormal; Notable for the following:    HIV 1 AB Positive (*)    All other components within normal limits  URINE CULTURE  CULTURE, BLOOD (ROUTINE X 2)  CULTURE, BLOOD (ROUTINE X 2)  CULTURE, EXPECTORATED SPUTUM-ASSESSMENT  CULTURE, RESPIRATORY (NON-EXPECTORATED)  STREP PNEUMONIAE URINARY ANTIGEN  LEGIONELLA PNEUMOPHILA SEROGP 1 UR AG  INFLUENZA PANEL BY PCR (TYPE A & B, H1N1)  I-STAT CG4 LACTIC ACID, ED  I-STAT CG4 LACTIC ACID, ED    EKG  EKG Interpretation  Date/Time:  Tuesday August 02 2016 02:09:25 EDT Ventricular Rate:  89 PR Interval:    QRS Duration: 97 QT Interval:  392 QTC Calculation: 477 R Axis:   21 Text Interpretation:  Sinus rhythm Probable left atrial enlargement Abnormal R-wave progression, early transition Nonspecific T abnormalities, lateral leads and avL No acute changes Confirmed by Kathrynn Humble, MD, Faydra Korman ([id number]) on 08/02/2016 3:42:40 AM       Radiology No results found.  Procedures .Critical Care Performed by: Varney Biles Authorized by: Varney Biles   Critical care provider statement:    Critical care time (minutes):  40   Critical care time was exclusive of:  Separately billable procedures and treating other patients   Critical care was necessary to treat or prevent imminent or life-threatening deterioration of the following  conditions:  Sepsis   Critical care was time spent personally by me on the following activities:  Blood draw for specimens, development of treatment plan with patient or surrogate, discussions with primary provider, examination of patient, ordering and performing treatments and interventions, ordering and review of laboratory studies,  pulse oximetry, re-evaluation of patient's condition and obtaining history from patient or surrogate   (including critical care time)  Medications Ordered in ED Medications  cefTRIAXone (ROCEPHIN) 1 g in dextrose 5 % 50 mL IVPB (0 g Intravenous Stopped 08/02/16 0320)  sodium chloride 0.9 % bolus 1,000 mL (0 mLs Intravenous Stopped 08/02/16 0434)  azithromycin (ZITHROMAX) 500 mg in dextrose 5 % 250 mL IVPB (0 mg Intravenous Stopped 08/02/16 0434)     Initial Impression / Assessment and Plan / ED Course  I have reviewed the triage vital signs and the nursing notes.  Pertinent labs & imaging results that were available during my care of the patient were reviewed by me and considered in my medical decision making (see chart for details).  Clinical Course    I personally performed the services described in this documentation, which was scribed in my presence. The recorded information has been reviewed and is accurate.  Pt with weakness, generalized and fevers. He has HIV hx. Pt's workup shows CAP. He also has AKI - and therefore has severe sepsis. Hydration and antibiotics started.   Final Clinical Impressions(s) / ED Diagnoses   Final diagnoses:  Community acquired pneumonia, unspecified laterality  AKI (acute kidney injury) ([place])  Severe sepsis ([place])    New Prescriptions Discharge Medication List as of 08/03/2016  5:02 PM    START taking these medications   Details  aspirin EC 81 MG EC tablet Take 1 tablet (81 mg total) by mouth daily., Starting Thu 08/04/2016, Print    azithromycin (ZITHROMAX) 500 MG tablet Take 1 tablet (500 mg total) by mouth  daily., Starting Wed 08/03/2016, Until Mon 08/08/2016, Print    predniSONE (DELTASONE) 10 MG tablet Take 4 tabs for 3 days, then 3 tabs for 3 days, then 2 tabs for 3 days, then 1 tab for 3 days, then 1/2 tab for 4 days., Print          Varney Biles, MD 08/04/16 [id number]

## 2016-08-02 NOTE — ED Notes (Signed)
Dr. Lara Mulch at bedside at this time.

## 2016-08-02 NOTE — Progress Notes (Signed)
PROGRESS NOTE        PATIENT DETAILS Name: Jacob [name] Age: 65 y.o. Sex: male Date of Birth: 03/31/1951 Admit Date: 08/02/2016 Admitting Physician Rise Patience, MD [id number], Brayton Layman, DO  Brief Narrative: Patient is a 65 y.o. male with a past medical hx of CHF, DM type 2, HIV, drug abuse ,GERD, HTN, HLD, and tobacco abuse admitted with worsening shortness of breath and cough, found to have pneumonia.  Subjective: Cough and shortness of breath much better.  Assessment/Plan: Principal Problem: Community-acquired Pneumonia:clinically improved-less shortness of breath and cough. Afebrile overnight. Continue Rocephin and Zithromax. Influenza PCR is negative however- urine legionella and sputum culture pending. Note last CD4 was 1330 on 06/28/16-hence doubt PCP.  Active Problems: ? Asthma/COPD exacerbation: Wheezing this morning, start short course of steroids. Continue bronchodilators.  Acute on chronic kidney disease stage III: Acute kidney injury likely secondary to prerenal azotemia in the setting of recent diarrhea and pneumonia. Creatinine improving with supportive care. Patient appears to have proteinuria, and could have underlying HIV nephropathy-if creatinine does not improve further-will need nephrology evaluation.  C. difficile colitis: Recently had diarrhea, a outpatient C. difficile PCR was positive, was started on Flagyl by PCP-continue. Diarrhea much better down to just 2-3 stools a day-stools are now more formed.  HIV: Continue antiretrovirals. CD4 was 1330 on 06/28/16  Hypertension: Moderately controlled-continue hydralazine, Imdur and Coreg. Follow and adjust accordingly  Minimally elevated troponins: Doubt clinical significance-patient denies any chest pain. EKG without any significant changes. May be secondary to worsening renal function. Already on aspirin, beta blocker and statin.  Chronic systolic heart failure (EF by TTE on Nov 2014  35-40%): Compensated, continue Coreg-suspect not a candidate for ACE/ARB-CKD stage III  History of CAD: Status post CABG in 2005-denies any chest pain-continue aspirin, statin and beta blocker  Insulin-dependent diabetes: Controlled-Continue 70 units of Lantus daily, 12 units of NovoLog with meals. Follow and adjust accordingly   Dyslipidemia: Continue statins and fibrates  History of peripheral neuropathy: Continue with Neurontin-chronic issue  History of gout: No evidence of flare-continue with colcichine and allopurinol  Tobacco abuse: Counseled  History of polysubstance abuse: Currently denies any active use, awaiting UDS  DVT Prophylaxis: Prophylactic Lovenox   Code Status: Full code   Family Communication: None at bedside  Disposition Plan: Remain inpatient-but will plan on Home health vs SNF on discharge  Antimicrobial agents: See below  Procedures: None  CONSULTS:  None  Time spent: 25 minutes-Greater than 50% of this time was spent in counseling, explanation of diagnosis, planning of further management, and coordination of care.  MEDICATIONS: Anti-infectives    Start     Dose/Rate Route Frequency Ordered Stop   08/03/16 0400  azithromycin (ZITHROMAX) 500 mg in dextrose 5 % 250 mL IVPB     500 mg 250 mL/hr over 60 Minutes Intravenous Every 24 hours 08/02/16 0523 08/10/16 0359   08/03/16 0000  cefTRIAXone (ROCEPHIN) 1 g in dextrose 5 % 50 mL IVPB     1 g 100 mL/hr over 30 Minutes Intravenous Every 24 hours 08/02/16 0523 08/09/16 2359   08/02/16 0800  elvitegravir-cobicistat-emtricitabine-tenofovir (GENVOYA) 150-150-200-10 MG tablet 1 tablet    Comments:  TAKE 1 TABLET BY MOUTH DAILY WITH BREAKFAST.     1 tablet Oral Daily with breakfast 08/02/16 0523     08/02/16 0200  azithromycin (ZITHROMAX) 500  mg in dextrose 5 % 250 mL IVPB     500 mg 250 mL/hr over 60 Minutes Intravenous  Once 08/02/16 0147 08/02/16 0434   08/02/16 0145  cefTRIAXone (ROCEPHIN) 1 g in  dextrose 5 % 50 mL IVPB     1 g 100 mL/hr over 30 Minutes Intravenous  Once 08/02/16 0136 08/02/16 0320      Scheduled Meds: . allopurinol  100 mg Oral Daily  . arformoterol  15 mcg Nebulization BID  . aspirin EC  81 mg Oral Daily  . atorvastatin  20 mg Oral Daily  . [START ON 08/03/2016] azithromycin  500 mg Intravenous Q24H  . carvedilol  25 mg Oral BID WC  . [START ON 08/03/2016] cefTRIAXone (ROCEPHIN)  IV  1 g Intravenous Q24H  . elvitegravir-cobicistat-emtricitabine-tenofovir  1 tablet Oral Q breakfast  . enoxaparin (LOVENOX) injection  40 mg Subcutaneous Q24H  . feeding supplement (ENSURE ENLIVE)  237 mL Oral BID BM  . fenofibrate  160 mg Oral Daily  . gabapentin  300 mg Oral TID  . hydrALAZINE  25 mg Oral TID  . insulin aspart  12 Units Subcutaneous TID WC  . insulin glargine  70 Units Subcutaneous Daily  . isosorbide mononitrate  30 mg Oral Daily  . latanoprost  1 drop Both Eyes QHS  . pantoprazole  40 mg Oral Daily  . umeclidinium bromide  1 puff Inhalation Daily   Continuous Infusions:  PRN Meds:.acetaminophen **OR** acetaminophen, albuterol, colchicine, nitroGLYCERIN, ondansetron **OR** ondansetron (ZOFRAN) IV   PHYSICAL EXAM: Vital signs: Vitals:   08/02/16 0525 08/02/16 0534 08/02/16 0624 08/02/16 0900  BP: (!) 161/101  (!) 154/85 (!) 157/78  Pulse: 96  92 97  Resp: 20   18  Temp: 98.1 F (36.7 C)   98.9 F (37.2 C)  TempSrc: Oral   Oral  SpO2: 96%   98%  Weight:  87 kg (191 lb 14.4 oz)    Height:  6\' 2"  (1.88 m)     Filed Weights   08/01/16 2101 08/02/16 0534  Weight: 88.5 kg (195 lb) 87 kg (191 lb 14.4 oz)   Body mass index is 24.64 kg/m.   General appearance :Awake, alert, not in any distress. Speech Clear. Not toxic Looking Eyes:, pupils equally reactive to light and accomodation,no scleral icterus.Pink conjunctiva HEENT: Atraumatic and Normocephalic Neck: supple, no JVD. No cervical lymphadenopathy. No thyromegaly Resp:Good air entry  bilaterally,+rhonchi all over CVS: S1 S2 regular, no murmurs.  GI: Bowel sounds present, Non tender and not distended with no gaurding, rigidity or rebound.No organomegaly Extremities: B/L Lower Ext shows no edema, both legs are warm to touch Neurology:  speech clear,Non focal, sensation is grossly intact. Psychiatric: Normal judgment and insight. Alert and oriented x 3. Normal mood. Musculoskeletal:gait appears to be normal.No digital cyanosis Skin:No Rash, warm and dry Wounds:N/A  I have personally reviewed following labs and imaging studies  LABORATORY DATA: CBC:  Recent Labs Lab 07/26/16 1548 08/01/16 2107 08/02/16 0530  WBC 5.9 11.4* 9.1  NEUTROABS 2,891 6.2 5.0  HGB 13.7 11.9* 12.3*  HCT 40.5 33.2* 35.0*  MCV 94.0 102.2* 101.4*  PLT 104* 272 161    Basic Metabolic Panel:  Recent Labs Lab 08/01/16 2107 08/02/16 0530  NA 139 138  K 4.9 4.7  CL 111 110  CO2 18* 20*  GLUCOSE 111* 147*  BUN 32* 30*  CREATININE 2.71* 2.13*  CALCIUM 9.0 8.8*    GFR: Estimated Creatinine Clearance: 39.1 mL/min (by C-G formula  based on SCr of 2.13 mg/dL (H)).  Liver Function Tests:  Recent Labs Lab 08/01/16 2107 08/02/16 0530  AST 21 19  ALT 12* 13*  ALKPHOS 51 48  BILITOT 0.7 0.5  PROT 7.9 7.1  ALBUMIN 2.8* 2.5*   No results for input(s): LIPASE, AMYLASE in the last 168 hours. No results for input(s): AMMONIA in the last 168 hours.  Coagulation Profile: No results for input(s): INR, PROTIME in the last 168 hours.  Cardiac Enzymes:  Recent Labs Lab 08/02/16 0135 08/02/16 0530 08/02/16 1109  TROPONINI 0.05* 0.06* 0.03*    BNP (last 3 results) No results for input(s): PROBNP in the last 8760 hours.  HbA1C: No results for input(s): HGBA1C in the last 72 hours.  CBG:  Recent Labs Lab 08/02/16 0529 08/02/16 0821 08/02/16 1143  GLUCAP 141* 119* 266*    Lipid Profile: No results for input(s): CHOL, HDL, LDLCALC, TRIG, CHOLHDL, LDLDIRECT in the last 72  hours.  Thyroid Function Tests: No results for input(s): TSH, T4TOTAL, FREET4, T3FREE, THYROIDAB in the last 72 hours.  Anemia Panel: No results for input(s): VITAMINB12, FOLATE, FERRITIN, TIBC, IRON, RETICCTPCT in the last 72 hours.  Urine analysis:    Component Value Date/Time   COLORURINE AMBER (A) 08/01/2016 2113   APPEARANCEUR TURBID (A) 08/01/2016 2113   LABSPEC 1.029 08/01/2016 2113   PHURINE 5.0 08/01/2016 2113   GLUCOSEU NEGATIVE 08/01/2016 2113   GLUCOSEU NEG mg/dL 09/20/2010 2058   HGBUR NEGATIVE 08/01/2016 2113   HGBUR negative 05/31/2010 0948   BILIRUBINUR SMALL (A) 08/01/2016 2113   KETONESUR 15 (A) 08/01/2016 2113   PROTEINUR >300 (A) 08/01/2016 2113   UROBILINOGEN 0.2 06/24/2015 2336   NITRITE POSITIVE (A) 08/01/2016 2113   LEUKOCYTESUR SMALL (A) 08/01/2016 2113    Sepsis Labs: Lactic Acid, Venous    Component Value Date/Time   LATICACIDVEN 1.04 08/01/2016 2128    MICROBIOLOGY: Recent Results (from the past 240 hour(s))  Clostridium Difficile by PCR     Status: Abnormal   Collection Time: 07/26/16  3:48 PM  Result Value Ref Range Status   Toxigenic C Difficile by pcr Detected (AA) Not Detected Final    Comment: This test is for use only with liquid or soft stools; performance characteristics of other clinical specimen types have not been established.   This assay was performed by Cepheid GeneXpert(R) PCR. The performance characteristics of this assay have been determined by [employer]. Performance characteristics refer to the analytical performance of the test.   Culture, sputum-assessment     Status: None   Collection Time: 08/02/16  9:38 AM  Result Value Ref Range Status   Specimen Description EXPECTORATED SPUTUM  Final   Special Requests NONE  Final   Sputum evaluation   Final    THIS SPECIMEN IS ACCEPTABLE. RESPIRATORY CULTURE REPORT TO FOLLOW.   Report Status 08/02/2016 FINAL  Final  Culture, respiratory (NON-Expectorated)      Status: None (Preliminary result)   Collection Time: 08/02/16  9:38 AM  Result Value Ref Range Status   Specimen Description SPUTUM  Final   Special Requests NONE  Final   Gram Stain   Final    ABUNDANT WBC PRESENT,BOTH PMN AND MONONUCLEAR MODERATE GRAM POSITIVE COCCI IN PAIRS FEW GRAM POSITIVE COCCI IN CHAINS MODERATE GRAM VARIABLE ROD    Culture PENDING  Incomplete   Report Status PENDING  Incomplete    RADIOLOGY STUDIES/RESULTS: Dg Chest 2 View  Result Date: 08/01/2016 CLINICAL DATA:  Fever, generalized  body aches for 2 weeks. Congestion. EXAM: CHEST  2 VIEW COMPARISON:  11/03/2015 FINDINGS: Diffuse bronchial thickening with streaky perihilar opacities. More confluent opacities seen posteriorly on the lateral view, likely localizing to the left upper lobe and superior segment right lower lobe. Patient is post median sternotomy. Heart size and mediastinal contours are unchanged. There is atherosclerosis of the thoracic aorta. No pleural fluid. Remote right rib fractures. IMPRESSION: Bronchial thickening suggestive of acute bronchitis. There is superimposed confluent perihilar opacities likely localizing to the left upper lobe and superior right lower lobe concerning for pneumonia. Followup PA and lateral chest X-ray is recommended in 3-4 weeks following trial of antibiotic therapy to ensure resolution and exclude underlying malignancy. Electronically Signed   By: Jeb Levering M.D.   On: 08/01/2016 21:54     LOS: 0 days   Oren Binet, MD  [facility] Pager:[phone]  If 7PM-7AM, please contact night-coverage [email or web address] Password [email or web address] 08/02/2016, 1:55 PM

## 2016-08-02 NOTE — Progress Notes (Signed)
Initial Nutrition Assessment  DOCUMENTATION CODES:   Severe malnutrition in context of acute illness/injury  INTERVENTION:  Discontinue Ensure.   Provide Glucerna Shake po TID, each supplement provides 220 kcal and 10 grams of protein.  Encourage adequate PO intake.   NUTRITION DIAGNOSIS:   Malnutrition related to acute illness as evidenced by energy intake < or equal to 50% for > or equal to 5 days, moderate depletions of muscle mass.  GOAL:   Patient will meet greater than or equal to 90% of their needs  MONITOR:   PO intake, Supplement acceptance, Labs, Weight trends, Skin, I & O's  REASON FOR ASSESSMENT:   Malnutrition Screening Tool    ASSESSMENT:   65 y.o. male with a past medical hx of CHF, DM type 2, HIV, drug abuse ,GERD, HTN, HLD, and tobacco abuse presents with complaints of generalized weakness and shortness of breath.  Meal completion has been 100%. Pt reports having a good appetite currently. Pt reports usually consuming 3 meals a day at home, however reports since 10/4 he has not been able to keep his foods down. Weight has been stable. Pt currently has Ensure ordered and has been consuming them. CBG's 119-266 mg/dL. RD to modify orders and order Glucerna Shake to aid in managing blood glucose.   Nutrition-Focused physical exam completed. Findings are no fat depletion, moderate muscle depletion, and no edema.   Labs and medications reviewed.   Diet Order:  Diet heart healthy/carb modified Room service appropriate? Yes; Fluid consistency: Thin  Skin:  Reviewed, no issues  Last BM:  10/10  Height:   Ht Readings from Last 1 Encounters:  08/02/16 6\' 2"  (1.88 m)    Weight:   Wt Readings from Last 1 Encounters:  08/02/16 191 lb 14.4 oz (87 kg)    Ideal Body Weight:  86.36 kg  BMI:  Body mass index is 24.64 kg/m.  Estimated Nutritional Needs:   Kcal:  2200-2400  Protein:  110-120 grams  Fluid:  2.2 -2.4 L/day  EDUCATION NEEDS:   No  education needs identified at this time  Jacob Parker, MS, RD, LDN Pager # [phone] After hours/ weekend pager # [phone]

## 2016-08-03 DIAGNOSIS — B20 Human immunodeficiency virus [HIV] disease: Secondary | ICD-10-CM

## 2016-08-03 DIAGNOSIS — I2510 Atherosclerotic heart disease of native coronary artery without angina pectoris: Secondary | ICD-10-CM

## 2016-08-03 DIAGNOSIS — J189 Pneumonia, unspecified organism: Secondary | ICD-10-CM

## 2016-08-03 DIAGNOSIS — I255 Ischemic cardiomyopathy: Secondary | ICD-10-CM

## 2016-08-03 DIAGNOSIS — E43 Unspecified severe protein-calorie malnutrition: Secondary | ICD-10-CM

## 2016-08-03 DIAGNOSIS — E1159 Type 2 diabetes mellitus with other circulatory complications: Secondary | ICD-10-CM

## 2016-08-03 DIAGNOSIS — I10 Essential (primary) hypertension: Secondary | ICD-10-CM

## 2016-08-03 LAB — CBC WITH DIFFERENTIAL/PLATELET
BASOS PCT: 0 %
Basophils Absolute: 0 10*3/uL (ref 0.0–0.1)
EOS PCT: 0 %
Eosinophils Absolute: 0 10*3/uL (ref 0.0–0.7)
HEMATOCRIT: 34.4 % — AB (ref 39.0–52.0)
HEMOGLOBIN: 12 g/dL — AB (ref 13.0–17.0)
LYMPHS PCT: 16 %
Lymphs Abs: 1.6 10*3/uL (ref 0.7–4.0)
MCH: 33.2 pg (ref 26.0–34.0)
MCHC: 34.9 g/dL (ref 30.0–36.0)
MCV: 95.3 fL (ref 78.0–100.0)
Monocytes Absolute: 0.6 10*3/uL (ref 0.1–1.0)
Monocytes Relative: 6 %
NEUTROS PCT: 78 %
Neutro Abs: 7.7 10*3/uL (ref 1.7–7.7)
Platelets: 293 10*3/uL (ref 150–400)
RBC: 3.61 MIL/uL — ABNORMAL LOW (ref 4.22–5.81)
RDW: 14.1 % (ref 11.5–15.5)
WBC: 9.9 10*3/uL (ref 4.0–10.5)

## 2016-08-03 LAB — GLUCOSE, CAPILLARY
GLUCOSE-CAPILLARY: 315 mg/dL — AB (ref 65–99)
Glucose-Capillary: 198 mg/dL — ABNORMAL HIGH (ref 65–99)
Glucose-Capillary: 186 mg/dL — ABNORMAL HIGH (ref 65–99)

## 2016-08-03 LAB — LEGIONELLA PNEUMOPHILA SEROGP 1 UR AG: L. PNEUMOPHILA SEROGP 1 UR AG: NEGATIVE

## 2016-08-03 LAB — BASIC METABOLIC PANEL
Anion gap: 7 (ref 5–15)
BUN: 35 mg/dL — AB (ref 6–20)
CHLORIDE: 107 mmol/L (ref 101–111)
CO2: 20 mmol/L — ABNORMAL LOW (ref 22–32)
Calcium: 9.2 mg/dL (ref 8.9–10.3)
Creatinine, Ser: 1.9 mg/dL — ABNORMAL HIGH (ref 0.61–1.24)
GFR calc Af Amer: 40 mL/min — ABNORMAL LOW (ref >60)
GFR calc non Af Amer: 35 mL/min — ABNORMAL LOW (ref >60)
GLUCOSE: 221 mg/dL — AB (ref 65–99)
POTASSIUM: 5.5 mmol/L — AB (ref 3.5–5.1)
Sodium: 134 mmol/L — ABNORMAL LOW (ref 135–145)

## 2016-08-03 LAB — URINE CULTURE: Culture: NO GROWTH

## 2016-08-03 LAB — HIV 1/2 AB DIFFERENTIATION
HIV 1 Ab: POSITIVE — AB
HIV 2 Ab: NEGATIVE

## 2016-08-03 LAB — HIV ANTIBODY (ROUTINE TESTING W REFLEX)

## 2016-08-03 MED ORDER — PREDNISONE 10 MG PO TABS: ORAL_TABLET | ORAL | 0 refills | Status: DC

## 2016-08-03 MED ORDER — AZITHROMYCIN 500 MG PO TABS: 500.0000 mg | ORAL_TABLET | Freq: Every day | ORAL | 0 refills | Status: AC

## 2016-08-03 MED ORDER — ASPIRIN 81 MG PO TBEC: 81.0000 mg | DELAYED_RELEASE_TABLET | Freq: Every day | ORAL | 0 refills | Status: DC

## 2016-08-03 MED ORDER — METRONIDAZOLE 500 MG PO TABS: 500.0000 mg | ORAL_TABLET | Freq: Three times a day (TID) | ORAL | 0 refills | Status: AC

## 2016-08-03 NOTE — Discharge Summary (Addendum)
Physician Discharge Summary  Jacob [name] GMW:[id number] DOB: 1951-06-13 DOA: 08/02/2016  PCP: Gildardo Cranker, DO  Admit date: 08/02/2016 Discharge date: 08/03/2016  Admitted From: Home Disposition: Home  Recommendations for Outpatient Follow-up:  1. Follow up with PCP in 1 week 2. Please obtain BMP in one week to monitor further improvement of kidney function.  3. Please follow up on the following pending results: Blood culture final result and urine legionella result and respiratory culture result as well as Ha1c (for diabetes). 4. Needs repeat PA and lateral chest X-ray in 3-4 weeks following trial of antibiotic therapy to ensure resolution and exclude underlying malignancy.  Home Health: No  Equipment/Devices: None    Discharge Condition: Stable CODE STATUS: Full  Diet recommendation: heart healthy, carb modified   Brief/Interim Summary: Jacob [name] a 64 y.o.malewith a past medical hx of CHF, DM type 2, HIV, drug abuse ,GERD, HTN, HLD, and tobacco abusepresents with complaints of generalized weakness and shortness of breath. Patient has been having shortness of breath over the last few days with exertion and productive cough. He also has some pleuritic type of chest pain. Patient states while at home patient reported high-temperature and came to the ER. Chest x-ray shows possible pneumonia and on exam patient has bilateral wheezing. Labs also reveal acute on chronic renal failure. Started on ceftriaxone and Zithromax.. Admitted for further treatment of community acquiredpneumonia.   Discharge Diagnoses:  Principal Problem:   Pneumonia Active Problems:   Human immunodeficiency virus (HIV) disease ([place])   CAD (coronary artery disease)   Polysubstance abuse   Ischemic cardiomyopathy   HTN (hypertension)   Type 2 diabetes mellitus with vascular disease ([place])   Protein-calorie malnutrition, severe  Sepsis secondary to Community-acquired pneumonia - unknown  organism -Sepsis pathology resolved at time of discharge  -Continue Rocephin and Zithromax. Will DC with oral zithromax.  -Influenza PCR, urine strep Ag negative  -Blood culture NGTD  -Urine legionella pending  -Respiratory culture pending  -Note last CD4 was 1330on 06/28/16-hence doubt PCP.  ?Asthma/COPD exacerbation -Short course of steroids. Will DC with prednisone taper.  -Continue bronchodilators  Acute onchronic kidney disease stage III -Acute kidney injury likely secondary to prerenal azotemia in the setting of recent diarrhea and pneumonia. Creatinine improving with supportive care -Baseline Cr 1.4-1.9  -UA negative  -Improving with supportive care  -Need repeat BMP outpatient.   C. difficile colitis -Recently had diarrhea, a outpatient C. difficile PCR was positive, was started on Flagyl by PCP (10/4) Last day 10/13 for total 10 day tx.   HIV -Continue antiretrovirals. CD4 was 1330on 06/28/16  Hypertension -Moderately controlled-continue hydralazine, Imdur and Coreg  Minimally elevated troponin -Doubt clinical significance, no chest pain, no EKG changes   Chronic systolic heart failure (EF by TTE on Nov 2014 35-40%) -Compensated, continue Coreg  YQI:HKVQQV post CABG in 2005 -Continue aspirin, statin and beta blocker  Insulin-dependent diabetes, with neuropathy  -Check ha1c  -Continue70 units of Lantus daily, 12 units of NovoLog with meals.+SSI -Continue Neurontin  Dyslipidemia -Continue statins and fibrates  Macrocytic anemia -Baseline Hgb 14  -Could be secondary to HIV -Monitor   History of gout: -No evidence of flare -Continue with colcichineand allopurinol  Tobacco abuse -Cessation counseling    Discharge Instructions  Discharge Instructions    Call MD for:  difficulty breathing, headache or visual disturbances    Complete by:  As directed    Call MD for:  persistant nausea and vomiting    Complete by:  As directed    Call  MD for:  temperature >100.4    Complete by:  As directed    Diet - low sodium heart healthy    Complete by:  As directed    Discharge instructions    Complete by:  As directed    Follow up with PCP in 1 week Please obtain BMP in one week to monitor further improvement of kidney function.  Please follow up on the following pending results: Blood culture final result and urine legionella result and respiratory culture result as well as Ha1c (for diabetes).   Increase activity slowly    Complete by:  As directed        Medication List    TAKE these medications   allopurinol 100 MG tablet Commonly known as:  ZYLOPRIM Take 1 tablet (100 mg total) by mouth daily.   aspirin 81 MG EC tablet Take 1 tablet (81 mg total) by mouth daily. Start taking on:  08/04/2016   atorvastatin 20 MG tablet Commonly known as:  LIPITOR Take 1 tablet (20 mg total) by mouth daily.   azithromycin 500 MG tablet Commonly known as:  ZITHROMAX Take 1 tablet (500 mg total) by mouth daily.   B-D UF III MINI PEN NEEDLES 31G X 5 MM Misc Generic drug:  Insulin Pen Needle USE 4 TIMES DAILY   carvedilol 25 MG tablet Commonly known as:  COREG Take 1 tablet (25 mg total) by mouth 2 (two) times daily with a meal.   Choline Fenofibrate 135 MG capsule Take 1 capsule (135 mg total) by mouth daily.   colchicine 0.6 MG tablet Take 0.6 mg by mouth daily as needed. Reported on 04/04/2016   elvitegravir-cobicistat-emtricitabine-tenofovir 150-150-200-10 MG Tabs tablet Commonly known as:  GENVOYA TAKE 1 TABLET BY MOUTH DAILY WITH BREAKFAST.   gabapentin 300 MG capsule Commonly known as:  NEURONTIN Take 1 capsule (300 mg total) by mouth 3 (three) times daily.   GLUCOSAMINE CHONDR 1500 COMPLX PO Take 2 tablets by mouth daily. Reported on 04/04/2016   glucose blood test strip Check BS tice daily as instructed.   hydrALAZINE 25 MG tablet Commonly known as:  APRESOLINE Take 1 tablet (25 mg total) by mouth 3  (three) times daily.   Insulin Glargine 100 UNIT/ML Solostar Pen Commonly known as:  LANTUS SOLOSTAR Inject 70 Units into the skin daily. E11.22   insulin lispro 100 UNIT/ML KiwkPen Commonly known as:  HUMALOG KWIKPEN Inject 0.12 mLs (12 Units total) into the skin 3 (three) times daily. Before Meals DX E11.22   isosorbide mononitrate 30 MG 24 hr tablet Commonly known as:  IMDUR Take 30 mg by mouth daily. Reported on 04/04/2016   latanoprost 0.005 % ophthalmic solution Commonly known as:  XALATAN Place 1 drop into both eyes at bedtime. Reported on 04/04/2016   metroNIDAZOLE 500 MG tablet Commonly known as:  FLAGYL Take 1 tablet (500 mg total) by mouth 3 (three) times daily.   Naftifine HCl 2 % Crea Apply to affected area two times a day   nitroGLYCERIN 0.3 MG SL tablet Commonly known as:  NITROSTAT Place 1 tablet (0.3 mg total) under the tongue as needed for chest pain (not to exceed 3 tablets in one day.).   omeprazole 20 MG capsule Commonly known as:  PRILOSEC Take 1 capsule (20 mg total) by mouth 2 (two) times daily before a meal.   ONE TOUCH LANCETS Misc Check BS twice daily.   predniSONE 10 MG tablet Commonly known  as:  DELTASONE Take 4 tabs for 3 days, then 3 tabs for 3 days, then 2 tabs for 3 days, then 1 tab for 3 days, then 1/2 tab for 4 days.   umeclidinium-vilanterol 62.5-25 MCG/INH Aepb Commonly known as:  ANORO ELLIPTA Inhale 1 puff into the lungs daily.      Follow-up Information    Gildardo Cranker, DO. Schedule an appointment as soon as possible for a visit in 1 week(s).   Specialty:  Internal Medicine Contact information: [place] [address] [phone]        [name], MD .   Specialty:  Infectious Diseases Contact information: [address] [place] [address] [phone]          Allergies  Allergen Reactions  . Amphetamines Other (See Comments)    Pt is unaware of this -      Consultations:  None   Procedures/Studies: Dg Chest 2 View  Result Date: 08/01/2016 CLINICAL DATA:  Fever, generalized body aches for 2 weeks. Congestion. EXAM: CHEST  2 VIEW COMPARISON:  11/03/2015 FINDINGS: Diffuse bronchial thickening with streaky perihilar opacities. More confluent opacities seen posteriorly on the lateral view, likely localizing to the left upper lobe and superior segment right lower lobe. Patient is post median sternotomy. Heart size and mediastinal contours are unchanged. There is atherosclerosis of the thoracic aorta. No pleural fluid. Remote right rib fractures. IMPRESSION: Bronchial thickening suggestive of acute bronchitis. There is superimposed confluent perihilar opacities likely localizing to the left upper lobe and superior right lower lobe concerning for pneumonia. Followup PA and lateral chest X-ray is recommended in 3-4 weeks following trial of antibiotic therapy to ensure resolution and exclude underlying malignancy. Electronically Signed   By: Jeb Levering M.D.   On: 08/01/2016 21:54     Subjective: Patient doing well this afternoon. He states that his breathing is much better on the antibiotics. He denies any fevers overnight, no chest pain or shortness of breath. He denies any nausea, vomiting, abdominal pain or diarrhea. He was diagnosed with C. difficile about a week ago at PCPs office. His bowel movement has significantly improved and is now formed. He states that he has a wound on his left greater toe and has a follow-up appointment with podiatry this week.  Discharge Exam: Vitals:   08/03/16 0508 08/03/16 0744  BP: 130/81 (!) 144/89  Pulse: 93 87  Resp: 17 17  Temp: 98.4 F (36.9 C) 98.9 F (37.2 C)   Vitals:   08/03/16 0508 08/03/16 0540 08/03/16 0744 08/03/16 0915  BP: 130/81  (!) 144/89   Pulse: 93  87   Resp: 17  17   Temp: 98.4 F (36.9 C)  98.9 F (37.2 C)   TempSrc: Oral  Oral   SpO2: 98% 98% 98% 97%  Weight:       Height:        General: Pt is alert, awake, not in acute distress Cardiovascular: RRR, S1/S2 +, no rubs, no gallops Respiratory: CTA bilaterally, no wheezing, no rhonchi Abdominal: Soft, NT, ND, bowel sounds + Extremities: no edema, no cyanosis, +left great toe with clean, non-draining lesion, has outpatient appt with podiatry this week     The results of significant diagnostics from this hospitalization (including imaging, microbiology, ancillary and laboratory) are listed below for reference.     Microbiology: Recent Results (from the past 240 hour(s))  Clostridium Difficile by PCR     Status: Abnormal   Collection  Time: 07/26/16  3:48 PM  Result Value Ref Range Status   Toxigenic C Difficile by pcr Detected (AA) Not Detected Final    Comment: This test is for use only with liquid or soft stools; performance characteristics of other clinical specimen types have not been established.   This assay was performed by Cepheid GeneXpert(R) PCR. The performance characteristics of this assay have been determined by [employer]. Performance characteristics refer to the analytical performance of the test.   Urine culture     Status: None   Collection Time: 08/01/16  9:13 PM  Result Value Ref Range Status   Specimen Description URINE, RANDOM  Final   Special Requests NONE  Final   Culture NO GROWTH  Final   Report Status 08/03/2016 FINAL  Final  Blood culture (routine x 2)     Status: None (Preliminary result)   Collection Time: 08/02/16  1:50 AM  Result Value Ref Range Status   Specimen Description BLOOD RIGHT HAND  Final   Special Requests AEROBIC BOTTLE ONLY 1ML  Final   Culture NO GROWTH 1 DAY  Final   Report Status PENDING  Incomplete  Blood culture (routine x 2)     Status: None (Preliminary result)   Collection Time: 08/02/16  1:55 AM  Result Value Ref Range Status   Specimen Description BLOOD LEFT ARM  Final   Special Requests IN PEDIATRIC BOTTLE 1ML  Final    Culture NO GROWTH 1 DAY  Final   Report Status PENDING  Incomplete  Culture, sputum-assessment     Status: None   Collection Time: 08/02/16  9:38 AM  Result Value Ref Range Status   Specimen Description EXPECTORATED SPUTUM  Final   Special Requests NONE  Final   Sputum evaluation   Final    THIS SPECIMEN IS ACCEPTABLE. RESPIRATORY CULTURE REPORT TO FOLLOW.   Report Status 08/02/2016 FINAL  Final  Culture, respiratory (NON-Expectorated)     Status: None (Preliminary result)   Collection Time: 08/02/16  9:38 AM  Result Value Ref Range Status   Specimen Description SPUTUM  Final   Special Requests NONE  Final   Gram Stain   Final    ABUNDANT WBC PRESENT,BOTH PMN AND MONONUCLEAR MODERATE GRAM POSITIVE COCCI IN PAIRS FEW GRAM POSITIVE COCCI IN CHAINS MODERATE GRAM VARIABLE ROD    Culture CULTURE REINCUBATED FOR BETTER GROWTH  Final   Report Status PENDING  Incomplete     Labs: BNP (last 3 results) No results for input(s): BNP in the last 8760 hours. Basic Metabolic Panel:  Recent Labs Lab 08/01/16 2107 08/02/16 0530 08/03/16 1310  NA 139 138 134*  K 4.9 4.7 5.5*  CL 111 110 107  CO2 18* 20* 20*  GLUCOSE 111* 147* 221*  BUN 32* 30* 35*  CREATININE 2.71* 2.13* 1.90*  CALCIUM 9.0 8.8* 9.2   Liver Function Tests:  Recent Labs Lab 08/01/16 2107 08/02/16 0530  AST 21 19  ALT 12* 13*  ALKPHOS 51 48  BILITOT 0.7 0.5  PROT 7.9 7.1  ALBUMIN 2.8* 2.5*   No results for input(s): LIPASE, AMYLASE in the last 168 hours. No results for input(s): AMMONIA in the last 168 hours. CBC:  Recent Labs Lab 08/01/16 2107 08/02/16 0530 08/03/16 1310  WBC 11.4* 9.1 9.9  NEUTROABS 6.2 5.0 7.7  HGB 11.9* 12.3* 12.0*  HCT 33.2* 35.0* 34.4*  MCV 102.2* 101.4* 95.3  PLT 272 265 293   Cardiac Enzymes:  Recent Labs Lab 08/02/16  8588 08/02/16 0530 08/02/16 1109  TROPONINI 0.05* 0.06* 0.03*   BNP: Invalid input(s): POCBNP CBG:  Recent Labs Lab 08/02/16 1143  08/02/16 1720 08/02/16 2135 08/03/16 0740 08/03/16 1153  GLUCAP 266* 223* 245* 198* 186*   D-Dimer No results for input(s): DDIMER in the last 72 hours. Hgb A1c No results for input(s): HGBA1C in the last 72 hours. Lipid Profile No results for input(s): CHOL, HDL, LDLCALC, TRIG, CHOLHDL, LDLDIRECT in the last 72 hours. Thyroid function studies No results for input(s): TSH, T4TOTAL, T3FREE, THYROIDAB in the last 72 hours.  Invalid input(s): FREET3 Anemia work up No results for input(s): VITAMINB12, FOLATE, FERRITIN, TIBC, IRON, RETICCTPCT in the last 72 hours. Urinalysis    Component Value Date/Time   COLORURINE AMBER (A) 08/01/2016 2113   APPEARANCEUR TURBID (A) 08/01/2016 2113   LABSPEC 1.029 08/01/2016 2113   PHURINE 5.0 08/01/2016 2113   GLUCOSEU NEGATIVE 08/01/2016 2113   GLUCOSEU NEG mg/dL 09/20/2010 2058   HGBUR NEGATIVE 08/01/2016 2113   HGBUR negative 05/31/2010 0948   BILIRUBINUR SMALL (A) 08/01/2016 2113   KETONESUR 15 (A) 08/01/2016 2113   PROTEINUR >300 (A) 08/01/2016 2113   UROBILINOGEN 0.2 06/24/2015 2336   NITRITE POSITIVE (A) 08/01/2016 2113   LEUKOCYTESUR SMALL (A) 08/01/2016 2113   Sepsis Labs Invalid input(s): PROCALCITONIN,  WBC,  LACTICIDVEN Microbiology Recent Results (from the past 240 hour(s))  Clostridium Difficile by PCR     Status: Abnormal   Collection Time: 07/26/16  3:48 PM  Result Value Ref Range Status   Toxigenic C Difficile by pcr Detected (AA) Not Detected Final    Comment: This test is for use only with liquid or soft stools; performance characteristics of other clinical specimen types have not been established.   This assay was performed by Cepheid GeneXpert(R) PCR. The performance characteristics of this assay have been determined by [employer]. Performance characteristics refer to the analytical performance of the test.   Urine culture     Status: None   Collection Time: 08/01/16  9:13 PM  Result Value Ref Range  Status   Specimen Description URINE, RANDOM  Final   Special Requests NONE  Final   Culture NO GROWTH  Final   Report Status 08/03/2016 FINAL  Final  Blood culture (routine x 2)     Status: None (Preliminary result)   Collection Time: 08/02/16  1:50 AM  Result Value Ref Range Status   Specimen Description BLOOD RIGHT HAND  Final   Special Requests AEROBIC BOTTLE ONLY 1ML  Final   Culture NO GROWTH 1 DAY  Final   Report Status PENDING  Incomplete  Blood culture (routine x 2)     Status: None (Preliminary result)   Collection Time: 08/02/16  1:55 AM  Result Value Ref Range Status   Specimen Description BLOOD LEFT ARM  Final   Special Requests IN PEDIATRIC BOTTLE 1ML  Final   Culture NO GROWTH 1 DAY  Final   Report Status PENDING  Incomplete  Culture, sputum-assessment     Status: None   Collection Time: 08/02/16  9:38 AM  Result Value Ref Range Status   Specimen Description EXPECTORATED SPUTUM  Final   Special Requests NONE  Final   Sputum evaluation   Final    THIS SPECIMEN IS ACCEPTABLE. RESPIRATORY CULTURE REPORT TO FOLLOW.   Report Status 08/02/2016 FINAL  Final  Culture, respiratory (NON-Expectorated)     Status: None (Preliminary result)   Collection Time: 08/02/16  9:38 AM  Result Value Ref Range Status   Specimen Description SPUTUM  Final   Special Requests NONE  Final   Gram Stain   Final    ABUNDANT WBC PRESENT,BOTH PMN AND MONONUCLEAR MODERATE GRAM POSITIVE COCCI IN PAIRS FEW GRAM POSITIVE COCCI IN CHAINS MODERATE GRAM VARIABLE ROD    Culture CULTURE REINCUBATED FOR BETTER GROWTH  Final   Report Status PENDING  Incomplete     Time coordinating discharge: Over 30 minutes  SIGNED:  Dessa Phi, DO [facility] Pager [phone]  If 7PM-7AM, please contact night-coverage [email or web address] Password [email or web address] 08/03/2016, 4:29 PM

## 2016-08-03 NOTE — Consult Note (Signed)
   [facility] Inpatient Consult   08/03/2016  Jacob [name] [birth year] [id number]  Patient screened for potential [facility] services for HF and PNA screen. Chart review reveals the patient is a 65 y.o. male with a past medical hx of HF, DM type 2, HIV, drug abuse ,GERD, HTN, HLD, and tobacco abuseadmitted with worsening shortness of breath and cough, found to have pneumonia.  Patient is eligible for [facility] Care Management services under patient's [facility] plan.  Spoke with inpatient RNCM regarding patient and patient may benefit from EMMI follow up for pneumonia.  Will follow for progress and needs.   Please place a [facility] Care Management consult or for questions contact:   Natividad Brood, RN BSN [facility] Liaison  [phone] business mobile phone Toll free office [phone]

## 2016-08-03 NOTE — Discharge Instructions (Signed)
1. Follow up with PCP in 1 week 2. Please obtain BMP in one week to monitor further improvement of kidney function.  3. Please follow up on the following pending results: Blood culture final result and urine legionella result and respiratory culture result as well as Ha1c (for diabetes). 4. Needs repeat PA and lateral chest X-ray in 3-4 weeks following trial of antibiotic therapy to ensure resolution and exclude underlying malignancy.

## 2016-08-04 ENCOUNTER — Ambulatory Visit: Payer: Medicare Other | Admitting: Gastroenterology

## 2016-08-04 ENCOUNTER — Ambulatory Visit: Payer: Medicare Other | Admitting: Nurse Practitioner

## 2016-08-04 LAB — CULTURE, RESPIRATORY W GRAM STAIN

## 2016-08-04 LAB — HEMOGLOBIN A1C
HEMOGLOBIN A1C: 7.3 % — AB (ref 4.8–5.6)
MEAN PLASMA GLUCOSE: 163 mg/dL

## 2016-08-04 LAB — CULTURE, RESPIRATORY

## 2016-08-07 LAB — CULTURE, BLOOD (ROUTINE X 2)
CULTURE: NO GROWTH
Culture: NO GROWTH

## 2016-08-09 ENCOUNTER — Other Ambulatory Visit: Payer: Self-pay

## 2016-08-09 ENCOUNTER — Ambulatory Visit (INDEPENDENT_AMBULATORY_CARE_PROVIDER_SITE_OTHER): Payer: Medicare Other | Admitting: Podiatry

## 2016-08-09 ENCOUNTER — Encounter: Payer: Self-pay | Admitting: Podiatry

## 2016-08-09 ENCOUNTER — Other Ambulatory Visit: Payer: Self-pay | Admitting: Pharmacotherapy

## 2016-08-09 ENCOUNTER — Encounter: Payer: Self-pay | Admitting: Nurse Practitioner

## 2016-08-09 ENCOUNTER — Ambulatory Visit (INDEPENDENT_AMBULATORY_CARE_PROVIDER_SITE_OTHER): Payer: Medicare Other | Admitting: Nurse Practitioner

## 2016-08-09 ENCOUNTER — Telehealth: Payer: Self-pay

## 2016-08-09 VITALS — BP 160/90 | HR 77 | Resp 14

## 2016-08-09 VITALS — BP 154/88 | HR 97 | Temp 97.9°F | Ht 74.0 in | Wt 190.4 lb

## 2016-08-09 DIAGNOSIS — A0472 Enterocolitis due to Clostridium difficile, not specified as recurrent: Secondary | ICD-10-CM

## 2016-08-09 DIAGNOSIS — Z794 Long term (current) use of insulin: Secondary | ICD-10-CM | POA: Diagnosis not present

## 2016-08-09 DIAGNOSIS — J181 Lobar pneumonia, unspecified organism: Principal | ICD-10-CM

## 2016-08-09 DIAGNOSIS — B351 Tinea unguium: Secondary | ICD-10-CM

## 2016-08-09 DIAGNOSIS — M79676 Pain in unspecified toe(s): Secondary | ICD-10-CM | POA: Diagnosis not present

## 2016-08-09 DIAGNOSIS — I10 Essential (primary) hypertension: Secondary | ICD-10-CM

## 2016-08-09 DIAGNOSIS — N179 Acute kidney failure, unspecified: Secondary | ICD-10-CM

## 2016-08-09 DIAGNOSIS — R195 Other fecal abnormalities: Secondary | ICD-10-CM

## 2016-08-09 DIAGNOSIS — E1142 Type 2 diabetes mellitus with diabetic polyneuropathy: Secondary | ICD-10-CM

## 2016-08-09 DIAGNOSIS — E1140 Type 2 diabetes mellitus with diabetic neuropathy, unspecified: Secondary | ICD-10-CM

## 2016-08-09 DIAGNOSIS — J189 Pneumonia, unspecified organism: Secondary | ICD-10-CM | POA: Diagnosis not present

## 2016-08-09 DIAGNOSIS — E611 Iron deficiency: Secondary | ICD-10-CM | POA: Diagnosis not present

## 2016-08-09 DIAGNOSIS — L84 Corns and callosities: Secondary | ICD-10-CM

## 2016-08-09 NOTE — Telephone Encounter (Signed)
I called patient to let him know that [name] is going to refer him to GI due to blood in his stool. [name] also wanted patient to know that he is to have a chest xray in 3 weeks. Patient verbalized understanding of both of the above. He was also instructed to call the office if he was still having diarrhea in the next few days. Patient stated that he would call the office.

## 2016-08-09 NOTE — Progress Notes (Signed)
[name], male   DOB: 1951/04/13, 65 y.o.   MRN: [id number]   Subjective: This patient presents today complaining of thickened and elongated toenails on call for working wearing shoes and request toenail debridement. Is also complaining of multiple plantar calluses in the right and left feet. Since the visit of 06/22/2016 patient is been hospitalized for respiratory infection and is currently recovering  Objective:    Orientated 3  Vascular: No calf edema calf tenderness bilaterally DP pulses 2/4 bilaterally PT pulses 2/4 bilaterally Capillary reflex immediate bilaterally  Neurological: Sensation to 10 g monofilament wire intact 2/5 right and 3/5 left Vibratory sensation reactive right nonreactive left Ankle reflexes reactive bilaterally  Dermatological: The toenails are hypertrophic, elongated, deformed, discolored and tender to direct palpation 6-10 Keratoses medial hallux, fifth MPJ,, first MPJ bilaterally Plantar keratoses fourth MPJ  Musculoskeletal: HAV bilaterally  Assessment: Diabetic peripheral neuropathy Symptomatic onychomycoses 6-10 Multiple plantar calluses right and left  Plan: Debrided toenails 6-10 mechanically and legs without any bleeding Debride plantar calluses right and left without a bleeding  Reappoint 10 week

## 2016-08-09 NOTE — Patient Instructions (Signed)
Diabetes and Foot Care Diabetes may cause you to have problems because of poor blood supply (circulation) to your feet and legs. This may cause the skin on your feet to become thinner, break easier, and heal more slowly. Your skin may become dry, and the skin may peel and crack. You may also have nerve damage in your legs and feet causing decreased feeling in them. You may not notice minor injuries to your feet that could lead to infections or more serious problems. Taking care of your feet is one of the most important things you can do for yourself.  HOME CARE INSTRUCTIONS  Wear shoes at all times, even in the house. Do not go barefoot. Bare feet are easily injured.  Check your feet daily for blisters, cuts, and redness. If you cannot see the bottom of your feet, use a mirror or ask someone for help.  Wash your feet with warm water (do not use hot water) and mild soap. Then pat your feet and the areas between your toes until they are completely dry. Do not soak your feet as this can dry your skin.  Apply a moisturizing lotion or petroleum jelly (that does not contain alcohol and is unscented) to the skin on your feet and to dry, brittle toenails. Do not apply lotion between your toes.  Trim your toenails straight across. Do not dig under them or around the cuticle. File the edges of your nails with an emery board or nail file.  Do not cut corns or calluses or try to remove them with medicine.  Wear clean socks or stockings every day. Make sure they are not too tight. Do not wear knee-high stockings since they may decrease blood flow to your legs.  Wear shoes that fit properly and have enough cushioning. To break in new shoes, wear them for just a few hours a day. This prevents you from injuring your feet. Always look in your shoes before you put them on to be sure there are no objects inside.  Do not cross your legs. This may decrease the blood flow to your feet.  If you find a minor scrape,  cut, or break in the skin on your feet, keep it and the skin around it clean and dry. These areas may be cleansed with mild soap and water. Do not cleanse the area with peroxide, alcohol, or iodine.  When you remove an adhesive bandage, be sure not to damage the skin around it.  If you have a wound, look at it several times a day to make sure it is healing.  Do not use heating pads or hot water bottles. They may burn your skin. If you have lost feeling in your feet or legs, you may not know it is happening until it is too late.  Make sure your health care provider performs a complete foot exam at least annually or more often if you have foot problems. Report any cuts, sores, or bruises to your health care provider immediately. SEEK MEDICAL CARE IF:   You have an injury that is not healing.  You have cuts or breaks in the skin.  You have an ingrown nail.  You notice redness on your legs or feet.  You feel burning or tingling in your legs or feet.  You have pain or cramps in your legs and feet.  Your legs or feet are numb.  Your feet always feel cold. SEEK IMMEDIATE MEDICAL CARE IF:   There is increasing redness,   swelling, or pain in or around a wound.  There is a red line that goes up your leg.  Pus is coming from a wound.  You develop a fever or as directed by your health care provider.  You notice a bad smell coming from an ulcer or wound.   This information is not intended to replace advice given to you by your health care provider. Make sure you discuss any questions you have with your health care provider.   Document Released: 10/07/2000 Document Revised: 06/12/2013 Document Reviewed: 03/19/2013 Elsevier Interactive Patient Education 2016 [employer]  

## 2016-08-09 NOTE — Patient Instructions (Addendum)
Will get blood work today Bring stool back when you come for lab work and we will check it for blood Keep follow up with Dr [name]   Food Choices to Lower Your Triglycerides Triglycerides are a type of fat in your blood. High levels of triglycerides can increase the risk of heart disease and stroke. If your triglyceride levels are high, the foods you eat and your eating habits are very important. Choosing the right foods can help lower your triglycerides.  WHAT GENERAL GUIDELINES DO I NEED TO FOLLOW?  Lose weight if you are overweight.   Limit or avoid alcohol.   Fill one half of your plate with vegetables and green salads.   Limit fruit to two servings a day. Choose fruit instead of juice.   Make one fourth of your plate whole grains. Look for the word "whole" as the first word in the ingredient list.  Fill one fourth of your plate with lean protein foods.  Enjoy fatty fish (such as salmon, mackerel, sardines, and tuna) three times a week.   Choose healthy fats.   Limit foods high in starch and sugar.  Eat more home-cooked food and less restaurant, buffet, and fast food.  Limit fried foods.  Cook foods using methods other than frying.  Limit saturated fats.  Check ingredient lists to avoid foods with partially hydrogenated oils (trans fats) in them. WHAT FOODS CAN I EAT?  Grains Whole grains, such as whole wheat or whole grain breads, crackers, cereals, and pasta. Unsweetened oatmeal, bulgur, barley, quinoa, or brown rice. Corn or whole wheat flour tortillas.  Vegetables Fresh or frozen vegetables (raw, steamed, roasted, or grilled). Green salads. Fruits All fresh, canned (in natural juice), or frozen fruits. Meat and Other Protein Products Ground beef (85% or leaner), grass-fed beef, or beef trimmed of fat. Skinless chicken or [place]. Ground chicken or [place]. Pork trimmed of fat. All fish and seafood. Eggs. Dried beans, peas, or lentils. Unsalted nuts or seeds.  Unsalted canned or dry beans. Dairy Low-fat dairy products, such as skim or 1% milk, 2% or reduced-fat cheeses, low-fat ricotta or cottage cheese, or plain low-fat yogurt. Fats and Oils Tub margarines without trans fats. Light or reduced-fat mayonnaise and salad dressings. Avocado. Safflower, olive, or canola oils. Natural peanut or almond butter. The items listed above may not be a complete list of recommended foods or beverages. Contact your dietitian for more options. WHAT FOODS ARE NOT RECOMMENDED?  Grains White bread. White pasta. White rice. Cornbread. Bagels, pastries, and croissants. Crackers that contain trans fat. Vegetables White potatoes. Corn. Creamed or fried vegetables. Vegetables in a cheese sauce. Fruits Dried fruits. Canned fruit in light or heavy syrup. Fruit juice. Meat and Other Protein Products Fatty cuts of meat. Ribs, chicken wings, bacon, sausage, bologna, salami, chitterlings, fatback, hot dogs, bratwurst, and packaged luncheon meats. Dairy Whole or 2% milk, cream, half-and-half, and cream cheese. Whole-fat or sweetened yogurt. Full-fat cheeses. Nondairy creamers and whipped toppings. Processed cheese, cheese spreads, or cheese curds. Sweets and Desserts Corn syrup, sugars, honey, and molasses. Candy. Jam and jelly. Syrup. Sweetened cereals. Cookies, pies, cakes, donuts, muffins, and ice cream. Fats and Oils Butter, stick margarine, lard, shortening, ghee, or bacon fat. Coconut, palm kernel, or palm oils. Beverages Alcohol. Sweetened drinks (such as sodas, lemonade, and fruit drinks or punches). The items listed above may not be a complete list of foods and beverages to avoid. Contact your dietitian for more information.   This information is not intended  to replace advice given to you by your health care provider. Make sure you discuss any questions you have with your health care provider.   Document Released: 07/28/2004 Document Revised: 10/31/2014 Document  Reviewed: 08/14/2013 Elsevier Interactive Patient Education [employer].

## 2016-08-09 NOTE — Progress Notes (Signed)
Careteam: Patient Care Team: Gildardo Cranker, DO as PCP - General (Internal Medicine) [name], MD as PCP - Infectious Diseases (Infectious Diseases) [name], MD as Consulting Physician (Ophthalmology) [name], MD as Consulting Physician (Cardiology)  Advanced Directive information Does patient have an advance directive?: Yes, Type of Advance Directive: [place];Living will  Allergies  Allergen Reactions  . Amphetamines Other (See Comments)    Pt is unaware of this -     Chief Complaint  Patient presents with  . Other    TOC: follow up on pneumonia     HPI: Patient is a 65 y.o. male seen in the office today to follow up hospitalization for Pneumonia.  Pt with past medical hx of CHF, DM type 2, HIV, drug abuse. GERD, HTN, HLD, and tobacco abuse. Pt went to the hospital with complaints of generalized weakness and shortness of breath. He also had some pleuritic type of chest pain. Patient states while at home patient reported high-temperature and came to the ER. Chest x-ray showed possible pneumonia and on exam patient has bilateral wheezing. Labs also revealed acute on chronic renal failure treated with IV fluid and pt conts to try to increase hydration. He was started on ceftriaxone and Zithromax for PNA. Pt was discharged from hospital on oral Zithromax and he has completed this. Pt also was treated with prednisone and currently on taper. Tomorrow he titrate down to 2 tablets daily for 3 days.  Pt has completed flagyl and reports stool is back to normal.  He is feeling great. Walked here from lunch and has been walking around since this morning. Has to take the bus to get where he needs to go. Shortness of breath has improved significantly.   Working on diet to help improve triglycerides  Review of Systems:  Review of Systems  Constitutional: Positive for fatigue. Negative for chills and fever.  HENT: Negative for congestion, sore throat and  tinnitus.   Respiratory: Negative for cough and shortness of breath.   Cardiovascular: Negative for chest pain, palpitations and leg swelling.  Gastrointestinal: Negative for abdominal distention, abdominal pain, anal bleeding, blood in stool, constipation, diarrhea, nausea, rectal pain and vomiting.  Genitourinary: Negative for dysuria, frequency and urgency.  Musculoskeletal: Negative for arthralgias.  Skin: Negative.   Neurological: Negative for dizziness and headaches.    Past Medical History:  Diagnosis Date  . Arthritis   . Cataracts, bilateral    immature  . CHF (congestive heart failure) (HCC)    not on any meds  . Chronic back pain    stenosis  . Chronic kidney disease, unspecified   . Coronary atherosclerosis of artery bypass graft   . Coronary atherosclerosis of native coronary artery 2005   s/p surgery  . Drug abuse    hx; tested for cocaine as recently as 2/08. says he is not using drugs now - avoided defib. for this reason   . GERD (gastroesophageal reflux disease)    takes OTC meds as needed  . Glaucoma    uses eye drops daily  . Gout, unspecified    takes Allopurinol daily as well as Colchicine-if needed  . Hepatitis 1967   Hep C  . History of colon polyps    benign  . HTN (hypertension)    takes Coreg,Imdur.and Apresoline daily  . Human immunodeficiency virus (HIV) disease    takes Genvoya daily  . Hyperlipidemia    takes Atorvastatin daily  . Ischemic cardiomyopathy   .  Muscle spasm    takes Zanaflex as needed  . Nocturia   . Peripheral neuropathy (HCC)    takes gabapentin daily  . Pneumonia    hx of  . Shortness of breath dyspnea    rarely but if notices it then with exertion  . Syphilis, unspecified   . Type II or unspecified type diabetes mellitus without mention of complication, not stated as uncontrolled 2004   Lantus daily.Average fasting blood sugar 125-199   Past Surgical History:  Procedure Laterality Date  . COLONOSCOPY  2013    Dr.John Henrene Pastor   . CORONARY ARTERY BYPASS GRAFT    . laproscopic cholecystectomy  8/07  . left intertrochanteric hip fracture  2004   s/p intermedullary nail placement 2/08  . left transverse mandibular fracture  10/05  . LUMBAR LAMINECTOMY/DECOMPRESSION MICRODISCECTOMY N/A 02/29/2016   Procedure: Left L4-5 Lateral Recess Decompression, Removal Extradural Intraspinal Facet Cyst;  Surgeon: Marybelle Killings, MD;  Location: [facility];  Service: Orthopedics;  Laterality: N/A;   Social History:   reports that he has been smoking Cigarettes.  He has a 20.50 pack-year smoking history. He has never used smokeless tobacco. He reports that he drinks about 3.6 oz of alcohol per week . He reports that he does not use drugs.  Family History  Problem Relation Age of Onset  . Heart failure Father   . Hypertension Father   . Diabetes Brother   . Heart attack Brother   . Alzheimer's disease Mother   . Stroke Sister   . Diabetes Sister   . Alzheimer's disease Sister   . Hypertension Brother   . Diabetes Brother   . Drug abuse Brother   . Colon cancer Neg Hx     Medications: Patient's Medications  New Prescriptions   No medications on file  Previous Medications   ALLOPURINOL (ZYLOPRIM) 100 MG TABLET    Take 1 tablet (100 mg total) by mouth daily.   ATORVASTATIN (LIPITOR) 20 MG TABLET    Take 1 tablet (20 mg total) by mouth daily.   B-D UF III MINI PEN NEEDLES 31G X 5 MM MISC    USE 4 TIMES DAILY   CARVEDILOL (COREG) 25 MG TABLET    Take 1 tablet (25 mg total) by mouth 2 (two) times daily with a meal.   CHOLINE FENOFIBRATE 135 MG CAPSULE    Take 1 capsule (135 mg total) by mouth daily.   COLCHICINE 0.6 MG TABLET    Take 0.6 mg by mouth daily as needed. Reported on 04/04/2016   ELVITEGRAVIR-COBICISTAT-EMTRICITABINE-TENOFOVIR (GENVOYA) 150-150-200-10 MG TABS TABLET    TAKE 1 TABLET BY MOUTH DAILY WITH BREAKFAST.   GABAPENTIN (NEURONTIN) 300 MG CAPSULE    Take 1 capsule (300 mg total) by mouth 3 (three) times  daily.   GLUCOSAMINE-CHONDROIT-VIT C-MN (GLUCOSAMINE CHONDR 1500 COMPLX PO)    Take 2 tablets by mouth daily. Reported on 04/04/2016   GLUCOSE BLOOD TEST STRIP    Check BS tice daily as instructed.   HYDRALAZINE (APRESOLINE) 25 MG TABLET    Take 1 tablet (25 mg total) by mouth 3 (three) times daily.   INSULIN GLARGINE (LANTUS SOLOSTAR) 100 UNIT/ML SOLOSTAR PEN    Inject 70 Units into the skin daily. E11.22   INSULIN LISPRO (HUMALOG KWIKPEN) 100 UNIT/ML KIWKPEN    Inject 0.12 mLs (12 Units total) into the skin 3 (three) times daily. Before Meals DX E11.22   ISOSORBIDE MONONITRATE (IMDUR) 30 MG 24 HR TABLET  Take 30 mg by mouth daily. Reported on 04/04/2016   LATANOPROST (XALATAN) 0.005 % OPHTHALMIC SOLUTION    Place 1 drop into both eyes at bedtime. Reported on 04/04/2016   NAFTIFINE HCL 2 % CREA    Apply to affected area two times a day   NITROGLYCERIN (NITROSTAT) 0.3 MG SL TABLET    Place 1 tablet (0.3 mg total) under the tongue as needed for chest pain (not to exceed 3 tablets in one day.).   OMEPRAZOLE (PRILOSEC) 20 MG CAPSULE    Take 1 capsule (20 mg total) by mouth 2 (two) times daily before a meal.   ONE TOUCH LANCETS MISC    Check BS twice daily.   PREDNISONE (DELTASONE) 10 MG TABLET    Take 4 tabs for 3 days, then 3 tabs for 3 days, then 2 tabs for 3 days, then 1 tab for 3 days, then 1/2 tab for 4 days.   UMECLIDINIUM-VILANTEROL (ANORO ELLIPTA) 62.5-25 MCG/INH AEPB    Inhale 1 puff into the lungs daily.  Modified Medications   No medications on file  Discontinued Medications   ASPIRIN EC 81 MG EC TABLET    Take 1 tablet (81 mg total) by mouth daily.     Physical Exam:  Vitals:   08/09/16 1413  BP: (!) 154/88  Pulse: 97  Temp: 97.9 F (36.6 C)  TempSrc: Oral  SpO2: 98%  Weight: 190 lb 6.4 oz (86.4 kg)  Height: _0  (1.88 m)   Body mass index is 24.45 kg/m.  Physical Exam  Constitutional: He is oriented to person, place, and time. He appears well-developed and  well-nourished.  HENT:  Mouth/Throat: Oropharynx is clear and moist. No oropharyngeal exudate.  Eyes: Pupils are equal, round, and reactive to light. No scleral icterus.  Neck: Normal range of motion. Neck supple. Carotid bruit is not present. No thyromegaly present.  Cardiovascular: Normal rate, regular rhythm and normal heart sounds.   Pulmonary/Chest: Effort normal and breath sounds normal. He has no wheezes. He has no rales. He exhibits no tenderness.  Abdominal: Soft. Bowel sounds are normal. He exhibits no distension, no abdominal bruit, no pulsatile midline mass and no mass. There is no tenderness. There is no rebound and no guarding.  Lymphadenopathy:    He has no cervical adenopathy.  Neurological: He is alert and oriented to person, place, and time.  Skin: Skin is warm and dry.  Psychiatric: He has a normal mood and affect. His behavior is normal. Judgment and thought content normal.   Labs reviewed: Basic Metabolic Panel:  Recent Labs  08/01/16 2107 08/02/16 0530 08/03/16 1310  NA 139 138 134*  K 4.9 4.7 5.5*  CL 111 110 107  CO2 18* 20* 20*  GLUCOSE 111* 147* 221*  BUN 32* 30* 35*  CREATININE 2.71* 2.13* 1.90*  CALCIUM 9.0 8.8* 9.2   Liver Function Tests:  Recent Labs  06/28/16 1157 08/01/16 2107 08/02/16 0530  AST _1 ALT 31 12* 13*  ALKPHOS 76 51 48  BILITOT 0.4 0.7 0.5  PROT 7.6 7.9 7.1  ALBUMIN 3.7 2.8* 2.5*    Recent Labs  11/03/15 1125  LIPASE 77*   No results for input(s): AMMONIA in the last 8760 hours. CBC:  Recent Labs  08/01/16 2107 08/02/16 0530 08/03/16 1310  WBC 11.4* 9.1 9.9  NEUTROABS 6.2 5.0 7.7  HGB 11.9* 12.3* 12.0*  HCT 33.2* 35.0* 34.4*  MCV 102.2* 101.4* 95.3  PLT 272 265 293  Lipid Panel:  Recent Labs  12/29/15 0927 07/12/16 0920  CHOL 110* 150  HDL 23* 34*  LDLCALC NOT CALC NOT CALC  TRIG 564* 674*  CHOLHDL 4.8 4.4   TSH: No results for input(s): TSH in the last 8760 hours. A1C: Lab Results    Component Value Date   HGBA1C 7.3 (H) 08/03/2016     Assessment/Plan 1. Pneumonia of both upper lobes due to infectious organism -completed azithromycin, symtoms have improved, conts on prednisone taper  - DG Chest 2 View in 3 weeks for resolution on pneumonia.   2. Heme positive stool -will refer to GI due to ongoing heme positive stools  -will follow up CBC today  3. Essential hypertension Improved to 140/82 on recheck, to cont current regimen.   4. AKI (acute kidney injury) ([place]) Follow up BMP, cont to push fluids  5. Enteritis due to Clostridium difficile Has completed antibiotics, reports stools are back to baseline, however noted diarrhea today when testing for blood to notify if diarrhea persist   6. Type 2 diabetes mellitus with diabetic neuropathy, with long-term current use of insulin ([place]) Following with Cathy, A1c done in the hospital. To keep follow up on 25th.  - CMP with eGFR  Jhalil Silvera K. Harle Battiest  [facility] [phone] 8 am - 5 pm) [phone] (after hours)

## 2016-08-10 ENCOUNTER — Telehealth: Payer: Self-pay | Admitting: *Deleted

## 2016-08-10 LAB — COMPLETE METABOLIC PANEL WITH GFR
ALT: 14 U/L (ref 9–46)
AST: 17 U/L (ref 10–35)
Albumin: 3 g/dL — ABNORMAL LOW (ref 3.6–5.1)
Alkaline Phosphatase: 59 U/L (ref 40–115)
BUN: 55 mg/dL — ABNORMAL HIGH (ref 7–25)
CO2: 15 mmol/L — ABNORMAL LOW (ref 20–31)
Calcium: 8.3 mg/dL — ABNORMAL LOW (ref 8.6–10.3)
Chloride: 110 mmol/L (ref 98–110)
Creat: 2.07 mg/dL — ABNORMAL HIGH (ref 0.70–1.25)
GFR, Est African American: 37 mL/min — ABNORMAL LOW (ref >=60)
GFR, Est Non African American: 32 mL/min — ABNORMAL LOW (ref >=60)
Glucose, Bld: 434 mg/dL — ABNORMAL HIGH (ref 65–99)
Potassium: 5.7 mmol/L — ABNORMAL HIGH (ref 3.5–5.3)
Sodium: 136 mmol/L (ref 135–146)
Total Bilirubin: 0.2 mg/dL (ref 0.2–1.2)
Total Protein: 7.1 g/dL (ref 6.1–8.1)

## 2016-08-10 LAB — CBC
HCT: 34.5 % — ABNORMAL LOW (ref 38.5–50.0)
Hemoglobin: 12.2 g/dL — ABNORMAL LOW (ref 13.2–17.1)
MCH: 36.1 pg — ABNORMAL HIGH (ref 27.0–33.0)
MCHC: 35.4 g/dL (ref 32.0–36.0)
MCV: 102.1 fL — ABNORMAL HIGH (ref 80.0–100.0)
MPV: 11.4 fL (ref 7.5–12.5)
Platelets: 433 10*3/uL — ABNORMAL HIGH (ref 140–400)
RBC: 3.38 MIL/uL — ABNORMAL LOW (ref 4.20–5.80)
RDW: 15.2 % — ABNORMAL HIGH (ref 11.0–15.0)
WBC: 6.3 10*3/uL (ref 3.8–10.8)

## 2016-08-10 NOTE — Telephone Encounter (Signed)
[facility] [phone] lab called with a Lab alert on patient of a Glucose of 434, tested and verified.  Specimen # [id number]

## 2016-08-10 NOTE — Telephone Encounter (Signed)
Waiting on results via epic

## 2016-08-11 ENCOUNTER — Other Ambulatory Visit: Payer: Self-pay

## 2016-08-11 ENCOUNTER — Telehealth: Payer: Self-pay

## 2016-08-11 DIAGNOSIS — D62 Acute posthemorrhagic anemia: Secondary | ICD-10-CM

## 2016-08-11 LAB — FERRITIN: Ferritin: 164 ng/mL (ref 20–380)

## 2016-08-11 LAB — IRON AND TIBC
%SAT: 22 % (ref 15–60)
Iron: 46 ug/dL — ABNORMAL LOW (ref 50–180)
TIBC: 205 ug/dL — ABNORMAL LOW (ref 250–425)
UIBC: 159 ug/dL (ref 125–400)

## 2016-08-11 LAB — FOLATE: Folate: 8.6 ng/mL (ref >5.4)

## 2016-08-11 LAB — VITAMIN B12: Vitamin B-12: 438 pg/mL (ref 200–1100)

## 2016-08-11 NOTE — Telephone Encounter (Signed)
I called patient to see about scheduling a repeat BMP for tomorrow morning. He stated that he already has another appointment tomorrow so he cannot come in. He is scheduled for a lab appointment on 08/16/16. Is it ok to add the BMP onto that lab draw?   Please advise.

## 2016-08-11 NOTE — Telephone Encounter (Signed)
Looks like labs were drawn from different providers orders hgb stable but with the increase in mcv and mch can we add on vitamin P19 and folic acid levels as well as TIBC, iron, and ferritin  Pt also with abnormal bmp, can he come back to the office in the AM for fasting labs so we can repeat this.  Fwd to Dr [name] because I believe these labs resulted under her name if she has anything to add on or change

## 2016-08-11 NOTE — Telephone Encounter (Signed)
Patient called requesting lab results. Please advise on labs and sent to the clinical pool to be discussed with patient

## 2016-08-11 NOTE — Telephone Encounter (Signed)
Yes that will be ok

## 2016-08-15 ENCOUNTER — Telehealth: Payer: Self-pay | Admitting: Internal Medicine

## 2016-08-15 ENCOUNTER — Telehealth: Payer: Self-pay | Admitting: *Deleted

## 2016-08-15 MED ORDER — SODIUM POLYSTYRENE SULFONATE 15 GM/60ML PO SUSP: 60.0000 g | Freq: Once | ORAL | 0 refills | Status: AC

## 2016-08-15 NOTE — Telephone Encounter (Signed)
left msg asking if pt can complete AWV after lab appt on 10/26. VDM (dee-dee)

## 2016-08-15 NOTE — Telephone Encounter (Signed)
Patient called and left message on voicemail and stated that he had some blood sugar concerns. I called patient back and he stated he has already spoken with Chrae.

## 2016-08-15 NOTE — Telephone Encounter (Signed)
Jacob [name] spoke with patient about recommendations from [place] last week.  Results were also reviewed by Dr.Carter:  Potassium elevated and kidney function reduced - repeat BMP in 3 days; kayexelate 60gm po x 1 dose. No RF; improved nutritional status; continue current medications as ordered; follow up as scheduled  RX sent to [facility], lab appointment rescheduled from tomorrow to Thursday (after taking Kayexelate). Copy of labs placed at the front for pick-up.

## 2016-08-16 ENCOUNTER — Other Ambulatory Visit: Payer: Self-pay | Admitting: Internal Medicine

## 2016-08-16 ENCOUNTER — Other Ambulatory Visit: Payer: Medicare Other

## 2016-08-17 ENCOUNTER — Telehealth: Payer: Self-pay | Admitting: *Deleted

## 2016-08-17 MED ORDER — INSULIN LISPRO 100 UNIT/ML (KWIKPEN): 9.0000 [IU] | PEN_INJECTOR | Freq: Three times a day (TID) | SUBCUTANEOUS | 5 refills | Status: DC

## 2016-08-17 NOTE — Telephone Encounter (Signed)
Patient notified and agreed. Medication list updated.  

## 2016-08-17 NOTE — Telephone Encounter (Signed)
Patient called and stated that he is checking his blood sugar 4 times daily and is only suppose to be 2 times. But his blood sugars have been running alittle low.   08/16/16- 6:37am 50, 7:37am 125, 6:38pm 97,   7:42pm 122 08/17/16- 6:37am 77, 7:29am 149  Taking 70 units of Solostar and 12 units of Humalog  Patient wants to know if his insulin needs to be decreased and how often should he be checking his blood sugar. Please Advise.

## 2016-08-17 NOTE — Telephone Encounter (Signed)
Reduce humalog to 9 units with meals; cont lantus as ordered

## 2016-08-18 ENCOUNTER — Other Ambulatory Visit: Payer: Self-pay | Admitting: Nurse Practitioner

## 2016-08-18 ENCOUNTER — Other Ambulatory Visit: Payer: Medicare Other

## 2016-08-18 ENCOUNTER — Ambulatory Visit (INDEPENDENT_AMBULATORY_CARE_PROVIDER_SITE_OTHER): Payer: Medicare Other

## 2016-08-18 VITALS — BP 118/78 | HR 98 | Temp 97.6°F | Ht 74.0 in | Wt 189.0 lb

## 2016-08-18 DIAGNOSIS — Z0000 Encounter for general adult medical examination without abnormal findings: Secondary | ICD-10-CM | POA: Diagnosis not present

## 2016-08-18 DIAGNOSIS — E781 Pure hyperglyceridemia: Secondary | ICD-10-CM

## 2016-08-18 DIAGNOSIS — Z23 Encounter for immunization: Secondary | ICD-10-CM

## 2016-08-18 DIAGNOSIS — D62 Acute posthemorrhagic anemia: Secondary | ICD-10-CM | POA: Diagnosis not present

## 2016-08-18 DIAGNOSIS — E785 Hyperlipidemia, unspecified: Secondary | ICD-10-CM

## 2016-08-18 LAB — HEPATIC FUNCTION PANEL
ALBUMIN: 3.4 g/dL — AB (ref 3.6–5.1)
ALK PHOS: 62 U/L (ref 40–115)
ALT: 20 U/L (ref 9–46)
AST: 29 U/L (ref 10–35)
Bilirubin, Direct: 0.1 mg/dL (ref <=0.2)
Indirect Bilirubin: 0.3 mg/dL (ref 0.2–1.2)
TOTAL PROTEIN: 7.7 g/dL (ref 6.1–8.1)
Total Bilirubin: 0.4 mg/dL (ref 0.2–1.2)

## 2016-08-18 LAB — LIPID PANEL
Cholesterol: 162 mg/dL (ref 125–200)
HDL: 39 mg/dL — AB (ref >=40)
LDL Cholesterol: 90 mg/dL (ref <130)
Total CHOL/HDL Ratio: 4.2 Ratio (ref <=5.0)
Triglycerides: 165 mg/dL — ABNORMAL HIGH (ref <150)
VLDL: 33 mg/dL — ABNORMAL HIGH (ref <30)

## 2016-08-18 LAB — BASIC METABOLIC PANEL WITH GFR
BUN: 26 mg/dL — ABNORMAL HIGH (ref 7–25)
CALCIUM: 9.1 mg/dL (ref 8.6–10.3)
CO2: 20 mmol/L (ref 20–31)
CREATININE: 1.77 mg/dL — AB (ref 0.70–1.25)
Chloride: 111 mmol/L — ABNORMAL HIGH (ref 98–110)
GFR, EST AFRICAN AMERICAN: 45 mL/min — AB (ref >=60)
GFR, Est Non African American: 39 mL/min — ABNORMAL LOW (ref >=60)
GLUCOSE: 38 mg/dL — AB (ref 65–99)
Potassium: 5.1 mmol/L (ref 3.5–5.3)
SODIUM: 137 mmol/L (ref 135–146)

## 2016-08-18 NOTE — Progress Notes (Signed)
Subjective:   Jacob [name] is a 65 y.o. male who presents for an Initial Medicare Annual Wellness Visit.  Review of Systems  Cardiac Risk Factors include: advanced age (>21men, >34 women);hypertension;diabetes mellitus;smoking/ tobacco exposure;male gender    Objective:    Today's Vitals   08/18/16 0910  BP: 118/78  Pulse: 98  Temp: 97.6 F (36.4 C)  TempSrc: Oral  SpO2: 98%  Weight: 189 lb (85.7 kg)  Height: 6\' 2"  (1.88 m)  PainSc: 0-No pain   Body mass index is 24.27 kg/m.  Current Medications (verified) Outpatient Encounter Prescriptions as of 08/18/2016  Medication Sig  . allopurinol (ZYLOPRIM) 100 MG tablet Take 1 tablet (100 mg total) by mouth daily.  [name] aspirin EC 81 MG tablet Take 81 mg by mouth daily.  [name] atorvastatin (LIPITOR) 20 MG tablet Take 1 tablet (20 mg total) by mouth daily.  . B-D UF III MINI PEN NEEDLES 31G X 5 MM MISC USE 4 TIMES DAILY  . carvedilol (COREG) 25 MG tablet Take 1 tablet (25 mg total) by mouth 2 (two) times daily with a meal.  . Choline Fenofibrate 135 MG capsule Take 1 capsule (135 mg total) by mouth daily.  . colchicine 0.6 MG tablet Take 0.6 mg by mouth daily as needed. Reported on 04/04/2016  . elvitegravir-cobicistat-emtricitabine-tenofovir (GENVOYA) 150-150-200-10 MG TABS tablet TAKE 1 TABLET BY MOUTH DAILY WITH BREAKFAST.  [name] gabapentin (NEURONTIN) 300 MG capsule Take 1 capsule (300 mg total) by mouth 3 (three) times daily.  . Glucosamine-Chondroit-Vit C-Mn (GLUCOSAMINE CHONDR 1500 COMPLX PO) Take 2 tablets by mouth daily. Reported on 04/04/2016  . hydrALAZINE (APRESOLINE) 25 MG tablet Take 1 tablet (25 mg total) by mouth 3 (three) times daily.  . Insulin Glargine (LANTUS SOLOSTAR) 100 UNIT/ML Solostar Pen Inject 70 Units into the skin daily. E11.22  . insulin lispro (HUMALOG KWIKPEN) 100 UNIT/ML KiwkPen Inject 0.09 mLs (9 Units total) into the skin 3 (three) times daily. Before Meals DX E11.22  . isosorbide mononitrate (IMDUR) 30 MG  24 hr tablet Take 30 mg by mouth daily. Reported on 04/04/2016  . latanoprost (XALATAN) 0.005 % ophthalmic solution Place 1 drop into both eyes at bedtime. Reported on 04/04/2016  . Naftifine HCl 2 % CREA Apply to affected area two times a day  . nitroGLYCERIN (NITROSTAT) 0.3 MG SL tablet Place 1 tablet (0.3 mg total) under the tongue as needed for chest pain (not to exceed 3 tablets in one day.).  [name] omeprazole (PRILOSEC) 20 MG capsule Take 1 capsule (20 mg total) by mouth 2 (two) times daily before a meal.  . ONE TOUCH LANCETS MISC Check BS twice daily.  . ONE TOUCH ULTRA TEST test strip CHECK BLOOD SUGAR TWO TIMES DAILY AS DIRECTED  . predniSONE (DELTASONE) 10 MG tablet Take 4 tabs for 3 days, then 3 tabs for 3 days, then 2 tabs for 3 days, then 1 tab for 3 days, then 1/2 tab for 4 days.  [name] umeclidinium-vilanterol (ANORO ELLIPTA) 62.5-25 MCG/INH AEPB Inhale 1 puff into the lungs daily.   No facility-administered encounter medications on file as of 08/18/2016.     Allergies (verified) Amphetamines   History: Past Medical History:  Diagnosis Date  . Arthritis   . Cataracts, bilateral    immature  . CHF (congestive heart failure) (HCC)    not on any meds  . Chronic back pain    stenosis  . Chronic kidney disease, unspecified   . Coronary atherosclerosis of artery bypass  graft   . Coronary atherosclerosis of native coronary artery 2005   s/p surgery  . Drug abuse    hx; tested for cocaine as recently as 2/08. says he is not using drugs now - avoided defib. for this reason   . GERD (gastroesophageal reflux disease)    takes OTC meds as needed  . Glaucoma    uses eye drops daily  . Gout, unspecified    takes Allopurinol daily as well as Colchicine-if needed  . Hepatitis 1967   Hep C  . History of colon polyps    benign  . HTN (hypertension)    takes Coreg,Imdur.and Apresoline daily  . Human immunodeficiency virus (HIV) disease    takes Genvoya daily  . Hyperlipidemia    takes  Atorvastatin daily  . Ischemic cardiomyopathy   . Muscle spasm    takes Zanaflex as needed  . Nocturia   . Peripheral neuropathy (HCC)    takes gabapentin daily  . Pneumonia    hx of  . Shortness of breath dyspnea    rarely but if notices it then with exertion  . Syphilis, unspecified   . Type II or unspecified type diabetes mellitus without mention of complication, not stated as uncontrolled 2004   Lantus daily.Average fasting blood sugar 125-199   Past Surgical History:  Procedure Laterality Date  . COLONOSCOPY  2013   Dr.John Henrene Pastor   . CORONARY ARTERY BYPASS GRAFT    . laproscopic cholecystectomy  8/07  . left intertrochanteric hip fracture  2004   s/p intermedullary nail placement 2/08  . left transverse mandibular fracture  10/05  . LUMBAR LAMINECTOMY/DECOMPRESSION MICRODISCECTOMY N/A 02/29/2016   Procedure: Left L4-5 Lateral Recess Decompression, Removal Extradural Intraspinal Facet Cyst;  Surgeon: Marybelle Killings, MD;  Location: [facility];  Service: Orthopedics;  Laterality: N/A;   Family History  Problem Relation Age of Onset  . Heart failure Father   . Hypertension Father   . Diabetes Brother   . Heart attack Brother   . Alzheimer's disease Mother   . Stroke Sister   . Diabetes Sister   . Alzheimer's disease Sister   . Hypertension Brother   . Diabetes Brother   . Drug abuse Brother   . Colon cancer Neg Hx    Social History   Occupational History  . Not on file.   Social History Main Topics  . Smoking status: Current Every Day Smoker    Packs/day: 0.50    Years: 41.00    Types: Cigarettes  . Smokeless tobacco: Never Used  . Alcohol use 3.6 oz/week    6 Standard drinks or equivalent per week     Comment: Drinks 4-5 beers on weekends.   . Drug use: No     Comment: hx of crack/cocaine 21yrs ago  . Sexual activity: Not Currently    Partners: Male     Comment: declined condoms   Tobacco Counseling Ready to quit: No Counseling given: No   Activities of  Daily Living In your present state of health, do you have any difficulty performing the following activities: 08/18/2016 08/02/2016  Hearing? N N  Vision? N N  Difficulty concentrating or making decisions? Y N  Walking or climbing stairs? N N  Dressing or bathing? N N  Doing errands, shopping? N Y  Conservation officer, nature and eating ? N -  Using the Toilet? N -  In the past six months, have you accidently leaked urine? N -  Do you have  problems with loss of bowel control? N -  Managing your Medications? N -  Managing your Finances? N -  Housekeeping or managing your Housekeeping? N -  Some recent data might be hidden    Immunizations and Health Maintenance Immunization History  Administered Date(s) Administered  . Hepatitis B 08/28/2006, 10/02/2007, 04/01/2008  . Hepatitis B, adult 06/03/2014, 07/04/2014  . Influenza Split 07/28/2011  . Influenza Whole 08/28/2006, 09/10/2007, 09/15/2008, 08/03/2009, 07/26/2010  . Influenza,inj,Quad PF,36+ Mos 07/04/2014, 07/06/2015, 07/12/2016  . Influenza-Unspecified 06/24/2013  . Pneumococcal Conjugate-13 08/18/2016  . Pneumococcal Polysaccharide-23 08/28/2006, 07/28/2011  . Zoster 06/03/2014   Health Maintenance Due  Topic Date Due  . FOOT EXAM  08/06/2016    Patient Care Team: Gildardo Cranker, DO as PCP - General (Internal Medicine) [name], MD as PCP - Infectious Diseases (Infectious Diseases) [name], MD as Consulting Physician (Ophthalmology) [name], MD as Consulting Physician (Cardiology) [name], MD as Consulting Physician (Infectious Diseases) Gardiner Barefoot, DPM as Consulting Physician (Podiatry)  Indicate any recent Medical Services you may have received from other than [facility] providers in the past year (date may be approximate).    Assessment:   This is a routine wellness examination for Jacob [name].   Hearing/Vision screen Hearing Screening Comments: Unsure of last hearing screen.  Vision Screening Comments:  Last eye exam done 12/15/15.   Dietary issues and exercise activities discussed: Current Exercise Habits: Home exercise routine, Type of exercise: walking, Time (Minutes): 60, Frequency (Times/Week): 7, Weekly Exercise (Minutes/Week): 420  Goals    . Blood Pressure < 140/90    . HEMOGLOBIN A1C < 7.0    . LDL CALC < 100    . Quit smoking / using tobacco          Starting 08/18/16, I will attempt to stop smoking over the next year.       Depression Screen PHQ 2/9 Scores 08/18/2016 07/12/2016 04/14/2016 08/07/2015  PHQ - 2 Score 0 0 0 0  PHQ- 9 Score - - - -    Fall Risk Fall Risk  08/18/2016 08/09/2016 07/26/2016 07/15/2016 07/12/2016  Falls in the past year? Yes No No Yes Yes  Number falls in past yr: 1 - - 1 1  Injury with Fall? Yes - - No No  Risk Factor Category  - - - - -  Risk for fall due to : - - - - -  Risk for fall due to (comments): - - - - -  Follow up Falls prevention discussed - - - Falls prevention discussed;Falls evaluation completed    Cognitive Function: MMSE - Mini Mental State Exam 08/18/2016 08/07/2015  Not completed: - (No Data)  Orientation to time 5 5  Orientation to Place 5 5  Registration 3 3  Attention/ Calculation 5 5  Recall 2 3  Language- name 2 objects 2 2  Language- repeat 1 1  Language- follow 3 step command 3 3  Language- read & follow direction 1 1  Write a sentence 1 1  Copy design 1 0  Total score 29 29        Screening Tests Health Maintenance  Topic Date Due  . FOOT EXAM  08/06/2016  . HEMOGLOBIN A1C  11/03/2016  . OPHTHALMOLOGY EXAM  12/14/2016  . LIPID PANEL  07/12/2017  . PNA vac Low Risk Adult (2 of 2 - PPSV23) 08/18/2017  . TETANUS/TDAP  07/27/2021  . COLONOSCOPY  10/01/2022  . INFLUENZA VACCINE  Completed  . ZOSTAVAX  Completed  . Hepatitis C Screening  Completed        Plan:  I have personally reviewed and addressed the Medicare Annual Wellness questionnaire and have noted the following in the patient's chart:    A. Medical and social history B. Use of alcohol, tobacco or illicit drugs  C. Current medications and supplements D. Functional ability and status E.  Nutritional status F.  Physical activity G. Advance directives H. List of other physicians I.  Hospitalizations, surgeries, and ER visits in previous 12 months J.  [place] to include hearing, vision, cognitive, depression L. Referrals and appointments - none  In addition, I have reviewed and discussed with patient certain preventive protocols, quality metrics, and best practice recommendations. A written personalized care plan for preventive services as well as general preventive health recommendations were provided to patient.  See attached scanned questionnaire for additional information.   Signed,   Allyn Kenner, LPN Health Advisor

## 2016-08-18 NOTE — Patient Instructions (Signed)
Jacob [name] , Thank you for taking time to come for your Medicare Wellness Visit. I appreciate your ongoing commitment to your health goals. Please review the following plan we discussed and let me know if I can assist you in the future.   These are the goals we discussed: Goals    . Blood Pressure < 140/90    . HEMOGLOBIN A1C < 7.0    . LDL CALC < 100    . Quit smoking / using tobacco          Starting 08/18/16, I will attempt to stop smoking over the next year.        This is a list of the screening recommended for you and due dates:  Health Maintenance  Topic Date Due  . Pneumonia vaccines (1 of 2 - PCV13) 01/24/2014  . Complete foot exam   08/06/2016  . Hemoglobin A1C  11/03/2016  . Eye exam for diabetics  12/14/2016  . Lipid (cholesterol) test  07/12/2017  . Tetanus Vaccine  07/27/2021  . Colon Cancer Screening  10/01/2022  . Flu Shot  Completed  . Shingles Vaccine  Completed  .  Hepatitis C: One time screening is recommended by Center for Disease Control  (CDC) for  adults born from [birth year] through 1967.   Completed  Preventive Care for Adults  A healthy lifestyle and preventive care can promote health and wellness. Preventive health guidelines for adults include the following key practices.  . A routine yearly physical is a good way to check with your health care provider about your health and preventive screening. It is a chance to share any concerns and updates on your health and to receive a thorough exam.  . Visit your dentist for a routine exam and preventive care every 6 months. Brush your teeth twice a day and floss once a day. Good oral hygiene prevents tooth decay and gum disease.  . The frequency of eye exams is based on your age, health, family medical history, use  of contact lenses, and other factors. Follow your health care provider's ecommendations for frequency of eye exams.  . Eat a healthy diet. Foods like vegetables, fruits, whole grains, low-fat dairy  products, and lean protein foods contain the nutrients you need without too many calories. Decrease your intake of foods high in solid fats, added sugars, and salt. Eat the right amount of calories for you. Get information about a proper diet from your health care provider, if necessary.  . Regular physical exercise is one of the most important things you can do for your health. Most adults should get at least 150 minutes of moderate-intensity exercise (any activity that increases your heart rate and causes you to sweat) each week. In addition, most adults need muscle-strengthening exercises on 2 or more days a week.  Silver Sneakers may be a benefit available to you. To determine eligibility, you may visit the website: www.silversneakers.com or contact program at (916)840-9782 Mon-Fri between 8AM-8PM.   . Maintain a healthy weight. The body mass index (BMI) is a screening tool to identify possible weight problems. It provides an estimate of body fat based on height and weight. Your health care provider can find your BMI and can help you achieve or maintain a healthy weight.   For adults 20 years and older: ? A BMI below 18.5 is considered underweight. ? A BMI of 18.5 to 24.9 is normal. ? A BMI of 25 to 29.9 is considered overweight. ? A BMI  of 30 and above is considered obese.   . Maintain normal blood lipids and cholesterol levels by exercising and minimizing your intake of saturated fat. Eat a balanced diet with plenty of fruit and vegetables. Blood tests for lipids and cholesterol should begin at age 3 and be repeated every 5 years. If your lipid or cholesterol levels are high, you are over 50, or you are at high risk for heart disease, you may need your cholesterol levels checked more frequently. Ongoing high lipid and cholesterol levels should be treated with medicines if diet and exercise are not working.  . If you smoke, find out from your health care provider how to quit. If you do not  use tobacco, please do not start.  . If you choose to drink alcohol, please do not consume more than 2 drinks per day. One drink is considered to be 12 ounces (355 mL) of beer, 5 ounces (148 mL) of wine, or 1.5 ounces (44 mL) of liquor.  . If you are 39-76 years old, ask your health care provider if you should take aspirin to prevent strokes.  . Use sunscreen. Apply sunscreen liberally and repeatedly throughout the day. You should seek shade when your shadow is shorter than you. Protect yourself by wearing long sleeves, pants, a wide-brimmed hat, and sunglasses year round, whenever you are outdoors.  . Once a month, do a whole body skin exam, using a mirror to look at the skin on your back. Tell your health care provider of new moles, moles that have irregular borders, moles that are larger than a pencil eraser, or moles that have changed in shape or color.

## 2016-08-19 ENCOUNTER — Telehealth: Payer: Self-pay | Admitting: Internal Medicine

## 2016-08-19 NOTE — Telephone Encounter (Signed)
Per call from Dr. Sheppard Coil on today,  Pt called on last night after  Hours.  He was concerned about his sugar levels. Says the last reading was at 25.  Dr. Sheppard Coil informed the patient to reduce his lantis 70 to 60 units...FYI to Dr. [name].

## 2016-08-19 NOTE — Telephone Encounter (Signed)
Noted. On call provider reduced long acting insulin to 60 units due to lab showing blood glucose 38. Meal time insulin already reduced to 9 units on 10/25th. A1c 7.3. Follow up with [facility] as scheduled next week

## 2016-08-22 ENCOUNTER — Encounter: Payer: Self-pay | Admitting: Pharmacotherapy

## 2016-08-22 ENCOUNTER — Telehealth: Payer: Self-pay | Admitting: Cardiovascular Disease

## 2016-08-22 ENCOUNTER — Ambulatory Visit (INDEPENDENT_AMBULATORY_CARE_PROVIDER_SITE_OTHER): Payer: Medicare Other | Admitting: Pharmacotherapy

## 2016-08-22 VITALS — BP 130/90 | HR 69 | Temp 97.7°F | Ht 74.0 in | Wt 190.2 lb

## 2016-08-22 DIAGNOSIS — I10 Essential (primary) hypertension: Secondary | ICD-10-CM | POA: Diagnosis not present

## 2016-08-22 DIAGNOSIS — Z794 Long term (current) use of insulin: Secondary | ICD-10-CM

## 2016-08-22 DIAGNOSIS — E1140 Type 2 diabetes mellitus with diabetic neuropathy, unspecified: Secondary | ICD-10-CM | POA: Diagnosis not present

## 2016-08-22 NOTE — Telephone Encounter (Signed)
Walk In Pt Form-[employer] paper-dropped off gave to [employer].

## 2016-08-22 NOTE — Patient Instructions (Signed)
Keep up the good work

## 2016-08-22 NOTE — Progress Notes (Signed)
  Subjective:    Jacob [name] is a 65 y.o.African American male who presents for follow-up of Type 2 diabetes mellitus.   Current A1C is 7.3% FBG at labs was 38mg /dl He was checking BG 4 times per day.  Now taking Lantus 60 units daily and Humalog 9 units with each meal. Now fasting BG: 96-131mg /dl Afternoon BG:  98-136mg /dl  Trying to eat healthy Walking for exercise. No peripheral edema Some pain in feet.  No sores. Wears glasses. Denies problems with vision. Nocturia 2 times per night Denies dysuria. Staying well hydrated with water.   Review of Systems A comprehensive review of systems was negative except for: Eyes: positive for contacts/glasses Genitourinary: positive for nocturia Integument/breast: positive for dryness    Objective:    BP 130/90   Pulse 69   Temp 97.7 F (36.5 C) (Oral)   Ht 6\' 2"  (1.88 m)   Wt 190 lb 3.2 oz (86.3 kg)   BMI 24.42 kg/m   General:  alert, cooperative and no distress  Oropharynx: normal findings: lips normal without lesions and gums healthy   Eyes:  negative findings: lids and lashes normal and conjunctivae and sclerae normal   Ears:  external ears normal        Lung: clear to auscultation bilaterally  Heart:  regular rate and rhythm     Extremities: no edema, redness or tenderness in the calves or thighs  Skin: dry     Neuro: mental status, speech normal, alert and oriented x3 and gait and station normal   Lab Review Glucose, Bld (mg/dL)  Date Value  08/18/2016 38 (LL)  08/09/2016 434 (H)  08/03/2016 221 (H)   CO2 (mmol/L)  Date Value  08/18/2016 20  08/09/2016 15 (L)  08/03/2016 20 (L)   BUN (mg/dL)  Date Value  08/18/2016 26 (H)  08/09/2016 55 (H)  08/03/2016 35 (H)  04/04/2016 25  11/16/2015 18  10/30/2015 61 (H)   Creat (mg/dL)  Date Value  08/18/2016 1.77 (H)  08/09/2016 2.07 (H)  07/12/2016 1.66 (H)   Creatinine, Ser (mg/dL)  Date Value  08/03/2016 1.90 (H)  08/02/2016 2.13 (H)   08/01/2016 2.71 (H)       Assessment:    Diabetes Mellitus type II, under good control. A1C is good at 7.3% Hypoglycemia resolved with decreased insulin dose. BP at goal <140/90   Plan:    1.  Rx changes: none  2.  Continue Lantus 60 units daily. 3.  Continue Humalog 9 units with each meal. 4.  Counseled on nutrition goals. 5.  Exercise goal is 30-45 minutes 5 x week. 6.  BP at goal <140/90

## 2016-08-26 ENCOUNTER — Ambulatory Visit: Payer: Medicare Other | Admitting: Physician Assistant

## 2016-09-02 ENCOUNTER — Telehealth: Payer: Self-pay | Admitting: Cardiovascular Disease

## 2016-09-02 NOTE — Telephone Encounter (Signed)
Called patient he is aware [employer] paper has been faxed to [phone]. Original copy mailed to his home address per his request.

## 2016-09-14 ENCOUNTER — Other Ambulatory Visit: Payer: Self-pay

## 2016-09-14 DIAGNOSIS — K219 Gastro-esophageal reflux disease without esophagitis: Secondary | ICD-10-CM

## 2016-09-14 MED ORDER — OMEPRAZOLE 20 MG PO CPDR: 20.0000 mg | DELAYED_RELEASE_CAPSULE | Freq: Two times a day (BID) | ORAL | 1 refills | Status: DC

## 2016-09-14 NOTE — Telephone Encounter (Signed)
Message left on triage voicemail, please call to discuss medications.  I called patient, rx for omeprazole was sent for #30 and instructions stated take two times daily.  Patient needs rx to be resent for #60. Done

## 2016-09-22 ENCOUNTER — Other Ambulatory Visit: Payer: Medicare Other

## 2016-09-22 ENCOUNTER — Encounter (INDEPENDENT_AMBULATORY_CARE_PROVIDER_SITE_OTHER): Payer: Self-pay

## 2016-09-22 ENCOUNTER — Other Ambulatory Visit: Payer: Self-pay | Admitting: Podiatry

## 2016-09-22 ENCOUNTER — Ambulatory Visit (INDEPENDENT_AMBULATORY_CARE_PROVIDER_SITE_OTHER): Payer: Medicare Other | Admitting: Gastroenterology

## 2016-09-22 ENCOUNTER — Encounter: Payer: Self-pay | Admitting: Gastroenterology

## 2016-09-22 ENCOUNTER — Other Ambulatory Visit: Payer: Self-pay | Admitting: Cardiovascular Disease

## 2016-09-22 ENCOUNTER — Ambulatory Visit: Payer: Medicare Other | Admitting: Physician Assistant

## 2016-09-22 VITALS — BP 110/70 | HR 80 | Ht 74.0 in | Wt 190.0 lb

## 2016-09-22 DIAGNOSIS — R195 Other fecal abnormalities: Secondary | ICD-10-CM | POA: Diagnosis not present

## 2016-09-22 DIAGNOSIS — Z8619 Personal history of other infectious and parasitic diseases: Secondary | ICD-10-CM | POA: Diagnosis not present

## 2016-09-22 DIAGNOSIS — I10 Essential (primary) hypertension: Secondary | ICD-10-CM

## 2016-09-22 NOTE — Patient Instructions (Signed)
If you are age 67 or older, your body mass index should be between 23-30. Your Body mass index is 24.39 kg/m. If this is out of the aforementioned range listed, please consider follow up with your Primary Care Provider.  If you are age 2 or younger, your body mass index should be between 19-25. Your Body mass index is 24.39 kg/m. If this is out of the aformentioned range listed, please consider follow up with your Primary Care Provider.   Your physician has requested that you go to the basement for the following lab work before leaving today:  Stool Study  Follow as needed.

## 2016-09-22 NOTE — Progress Notes (Signed)
HPI :  65 y/o male with h/o HIV, CHF with EF of 24%, CAD, DM, here for evaluation of guiac positive stool result. He is enrolled in the Anal Cancer Prevention Study at [facility] due to history of HIV. He reports he was hospitalized with pneumonia in October and on antibiotics. He developed diarrhea after this admission and tested positive for C Diff on 10/3 during which a rectal exam was done and guiac positive. HE was treated for C Diff and reports this resolved his diarrhea.   He does not see any blood in the stool. He reports he is homosexual and occasionally places objects into his rectum with his partner which can rarely cause bleeding. He typically has one-2 BM per day. He normally has formed stools, normally does not have diarrhea. Weight is stable around 183. No FH of colon cancer. He has a chronic anemia, Hgb 12.2,  MCV of 102. Normal ferritin with total iron level mildly decreased (46).    He denies any cardiopulmonary symptoms are present time. He denies any shortness of breath or chest pains, is active and ambulates for his work. He has had a stress test in March 2016 showing no ischemic changes with EF of 24%. Followed by cardiology. He wishes to avoid colonoscopy if possible. Last colonoscopy in 2013 as below.  Colonoscopy 09/2012 - diminutive polyp ascending colon - no path available, sigmoid diverticulosis  Past Medical History:  Diagnosis Date  . Arthritis   . Cataracts, bilateral    immature  . CHF (congestive heart failure) (HCC)    not on any meds  . Chronic back pain    stenosis  . Chronic kidney disease, unspecified   . Coronary atherosclerosis of artery bypass graft   . Coronary atherosclerosis of native coronary artery 2005   s/p surgery  . Drug abuse    hx; tested for cocaine as recently as 2/08. says he is not using drugs now - avoided defib. for this reason   . GERD (gastroesophageal reflux disease)    takes OTC meds as needed  . Glaucoma    uses eye drops  daily  . Gout, unspecified    takes Allopurinol daily as well as Colchicine-if needed  . Hepatitis 1967   Hep C  . History of colon polyps    benign  . HTN (hypertension)    takes Coreg,Imdur.and Apresoline daily  . Human immunodeficiency virus (HIV) disease    takes Genvoya daily  . Hyperlipidemia    takes Atorvastatin daily  . Ischemic cardiomyopathy   . Muscle spasm    takes Zanaflex as needed  . Nocturia   . Peripheral neuropathy (HCC)    takes gabapentin daily  . Pneumonia    hx of  . Shortness of breath dyspnea    rarely but if notices it then with exertion  . Syphilis, unspecified   . Type II or unspecified type diabetes mellitus without mention of complication, not stated as uncontrolled 2004   Lantus daily.Average fasting blood sugar 125-199     Past Surgical History:  Procedure Laterality Date  . COLONOSCOPY  2013   Dr.John Henrene Pastor   . CORONARY ARTERY BYPASS GRAFT    . laproscopic cholecystectomy  8/07  . left intertrochanteric hip fracture  2004   s/p intermedullary nail placement 2/08  . left transverse mandibular fracture  10/05  . LUMBAR LAMINECTOMY/DECOMPRESSION MICRODISCECTOMY N/A 02/29/2016   Procedure: Left L4-5 Lateral Recess Decompression, Removal Extradural Intraspinal Facet Cyst;  Surgeon:  Marybelle Killings, MD;  Location: [place];  Service: Orthopedics;  Laterality: N/A;   Family History  Problem Relation Age of Onset  . Heart failure Father   . Hypertension Father   . Diabetes Brother   . Heart attack Brother   . Alzheimer's disease Mother   . Stroke Sister   . Diabetes Sister   . Alzheimer's disease Sister   . Hypertension Brother   . Diabetes Brother   . Drug abuse Brother   . Colon cancer Neg Hx    Social History  Substance Use Topics  . Smoking status: Current Every Day Smoker    Packs/day: 0.50    Years: 41.00    Types: Cigarettes  . Smokeless tobacco: Never Used  . Alcohol use 3.6 oz/week    6 Standard drinks or equivalent per week      Comment: occ.   Current Outpatient Prescriptions  Medication Sig Dispense Refill  . allopurinol (ZYLOPRIM) 100 MG tablet Take 1 tablet (100 mg total) by mouth daily. 30 tablet 3  . aspirin EC 81 MG tablet Take 81 mg by mouth daily.    [name] atorvastatin (LIPITOR) 20 MG tablet Take 1 tablet (20 mg total) by mouth daily. 30 tablet 3  . B-D UF III MINI PEN NEEDLES 31G X 5 MM MISC USE 4 TIMES DAILY 150 each 11  . carvedilol (COREG) 25 MG tablet Take 1 tablet (25 mg total) by mouth 2 (two) times daily with a meal. 180 tablet 3  . Choline Fenofibrate 135 MG capsule Take 1 capsule (135 mg total) by mouth daily. 30 capsule 6  . colchicine 0.6 MG tablet Take 0.6 mg by mouth daily as needed. Reported on 04/04/2016    . elvitegravir-cobicistat-emtricitabine-tenofovir (GENVOYA) 150-150-200-10 MG TABS tablet TAKE 1 TABLET BY MOUTH DAILY WITH BREAKFAST. 30 tablet 11  . gabapentin (NEURONTIN) 300 MG capsule Take 1 capsule (300 mg total) by mouth 3 (three) times daily. 90 capsule 1  . Glucosamine-Chondroit-Vit C-Mn (GLUCOSAMINE CHONDR 1500 COMPLX PO) Take 2 tablets by mouth daily. Reported on 04/04/2016    . hydrALAZINE (APRESOLINE) 25 MG tablet Take 1 tablet (25 mg total) by mouth 3 (three) times daily. 90 tablet 6  . insulin glargine (LANTUS) 100 UNIT/ML injection Inject 60 Units into the skin at bedtime.    . insulin lispro (HUMALOG KWIKPEN) 100 UNIT/ML KiwkPen Inject 0.09 mLs (9 Units total) into the skin 3 (three) times daily. Before Meals DX E11.22 15 mL 5  . isosorbide mononitrate (IMDUR) 30 MG 24 hr tablet Take 30 mg by mouth daily. Reported on 04/04/2016    . latanoprost (XALATAN) 0.005 % ophthalmic solution Place 1 drop into both eyes at bedtime. Reported on 04/04/2016    . Naftifine HCl 2 % CREA Apply to affected area two times a day 1 Tube 11  . nitroGLYCERIN (NITROSTAT) 0.3 MG SL tablet Place 1 tablet (0.3 mg total) under the tongue as needed for chest pain (not to exceed 3 tablets in one day.). 25 tablet  2  . omeprazole (PRILOSEC) 20 MG capsule Take 1 capsule (20 mg total) by mouth 2 (two) times daily before a meal. 60 capsule 1  . ONE TOUCH LANCETS MISC Check BS twice daily. 200 each 3  . ONE TOUCH ULTRA TEST test strip CHECK BLOOD SUGAR TWO TIMES DAILY AS DIRECTED 200 each 6  . umeclidinium-vilanterol (ANORO ELLIPTA) 62.5-25 MCG/INH AEPB Inhale 1 puff into the lungs daily. 14 each 3   No current  facility-administered medications for this visit.    Allergies  Allergen Reactions  . Amphetamines Other (See Comments)    Pt is unaware of this -      Review of Systems: All systems reviewed and negative except where noted in HPI.    Lab Results  Component Value Date   WBC 6.3 08/09/2016   HGB 12.2 (L) 08/09/2016   HCT 34.5 (L) 08/09/2016   MCV 102.1 (H) 08/09/2016   PLT 433 (H) 08/09/2016    CBC Latest Ref Rng & Units 08/09/2016 08/03/2016 08/02/2016  WBC 3.8 - 10.8 K/uL 6.3 9.9 9.1  Hemoglobin 13.2 - 17.1 g/dL 12.2(L) 12.0(L) 12.3(L)  Hematocrit 38.5 - 50.0 % 34.5(L) 34.4(L) 35.0(L)  Platelets 140 - 400 K/uL 433(H) 293 265    Lab Results  Component Value Date   IRON 46 (L) 08/09/2016   TIBC 205 (L) 08/09/2016   FERRITIN 164 08/09/2016     Physical Exam: BP 110/70   Pulse 80   Ht 6\' 2"  (1.88 m)   Wt 190 lb (86.2 kg)   BMI 24.39 kg/m  Constitutional: Pleasant,well-developed, male in no acute distress. HEENT: Normocephalic and atraumatic. Conjunctivae are normal. No scleral icterus. Neck supple.  Cardiovascular: Normal rate, regular rhythm.  Pulmonary/chest: Effort normal and breath sounds normal. No wheezing, rales or rhonchi. Abdominal: Soft, nondistended, nontender. There are no masses palpable. No hepatomegaly. DRE - patient declined Extremities: no edema Lymphadenopathy: No cervical adenopathy noted. Neurological: Alert and oriented to person place and time. Skin: Skin is warm and dry. No rashes noted. Psychiatric: Normal mood and affect. Behavior is  normal.   ASSESSMENT AND PLAN: 65 year old male with significant comorbidities as outlined above including HIV and history of CHF with ejection fraction of 24%, here for assessment of guaiac-positive stool in the setting of rectal exam done during a time when the patient was C. difficile positive.  He was treated for C. difficile symptoms of diarrhea resolved, he has no overt GI bleeding. He does endorse placing objects into his rectum at times which can cause scant bleeding. He had a colonoscopy in 2013 which did not show any significant pathology.  Overall, his guaiac tests could've been simply do to the rectal exam or his C. difficile at the time, or from mucosal damage due to placing objects in the rectum. We discussed options moving forward to include performing a colonoscopy, stool testing, versus no further testing. I think it unlikely he has any significant polyp or mass lesion causing this prior finding. He wants to avoid colonoscopy if possible which I agree with, especially given his low ejection fraction, however he was also not comfortable with simply monitoring even though I think it very unlikely we are missing anything significant. I asked him to avoid placing any objects into his rectum and we will obtain a FIT test. Further recommendations pending the result. He agreed.  [name] Luis Cellar, MD [facility] Pager [phone]  CC: Gildardo Cranker, DO

## 2016-09-26 ENCOUNTER — Telehealth: Payer: Self-pay | Admitting: Cardiovascular Disease

## 2016-09-26 NOTE — Telephone Encounter (Signed)
Follow Up:     Please call pt,says his Jacob [name] form was not filled out completely. He says he will give you the details when you call.Please call today if you can.

## 2016-09-26 NOTE — Telephone Encounter (Signed)
Patient will bring form back by the office to have filled correctly.

## 2016-09-30 ENCOUNTER — Other Ambulatory Visit (INDEPENDENT_AMBULATORY_CARE_PROVIDER_SITE_OTHER): Payer: Medicare Other

## 2016-09-30 DIAGNOSIS — R195 Other fecal abnormalities: Secondary | ICD-10-CM | POA: Diagnosis not present

## 2016-09-30 LAB — FECAL OCCULT BLOOD, IMMUNOCHEMICAL: Fecal Occult Bld: NEGATIVE

## 2016-10-14 ENCOUNTER — Ambulatory Visit: Payer: Medicare Other | Admitting: Internal Medicine

## 2016-10-20 ENCOUNTER — Encounter (HOSPITAL_COMMUNITY): Payer: Self-pay | Admitting: Emergency Medicine

## 2016-10-20 ENCOUNTER — Emergency Department (HOSPITAL_COMMUNITY): Payer: Medicare Other

## 2016-10-20 ENCOUNTER — Emergency Department (HOSPITAL_COMMUNITY)
Admission: EM | Admit: 2016-10-20 | Discharge: 2016-10-20 | Disposition: A | Discharge status: Home / Self Care | Payer: Medicare Other | Attending: Emergency Medicine | Admitting: Emergency Medicine

## 2016-10-20 DIAGNOSIS — Z794 Long term (current) use of insulin: Secondary | ICD-10-CM | POA: Insufficient documentation

## 2016-10-20 DIAGNOSIS — S0990XA Unspecified injury of head, initial encounter: Secondary | ICD-10-CM | POA: Insufficient documentation

## 2016-10-20 DIAGNOSIS — Y999 Unspecified external cause status: Secondary | ICD-10-CM | POA: Insufficient documentation

## 2016-10-20 DIAGNOSIS — E1140 Type 2 diabetes mellitus with diabetic neuropathy, unspecified: Secondary | ICD-10-CM | POA: Diagnosis not present

## 2016-10-20 DIAGNOSIS — W19XXXA Unspecified fall, initial encounter: Secondary | ICD-10-CM | POA: Insufficient documentation

## 2016-10-20 DIAGNOSIS — S0101XA Laceration without foreign body of scalp, initial encounter: Secondary | ICD-10-CM | POA: Insufficient documentation

## 2016-10-20 DIAGNOSIS — Z7982 Long term (current) use of aspirin: Secondary | ICD-10-CM | POA: Insufficient documentation

## 2016-10-20 DIAGNOSIS — Y92009 Unspecified place in unspecified non-institutional (private) residence as the place of occurrence of the external cause: Secondary | ICD-10-CM | POA: Diagnosis not present

## 2016-10-20 DIAGNOSIS — I2510 Atherosclerotic heart disease of native coronary artery without angina pectoris: Secondary | ICD-10-CM | POA: Diagnosis not present

## 2016-10-20 DIAGNOSIS — E1122 Type 2 diabetes mellitus with diabetic chronic kidney disease: Secondary | ICD-10-CM | POA: Diagnosis not present

## 2016-10-20 DIAGNOSIS — I130 Hypertensive heart and chronic kidney disease with heart failure and stage 1 through stage 4 chronic kidney disease, or unspecified chronic kidney disease: Secondary | ICD-10-CM | POA: Diagnosis not present

## 2016-10-20 DIAGNOSIS — F17210 Nicotine dependence, cigarettes, uncomplicated: Secondary | ICD-10-CM | POA: Insufficient documentation

## 2016-10-20 DIAGNOSIS — Z951 Presence of aortocoronary bypass graft: Secondary | ICD-10-CM | POA: Insufficient documentation

## 2016-10-20 DIAGNOSIS — N189 Chronic kidney disease, unspecified: Secondary | ICD-10-CM | POA: Insufficient documentation

## 2016-10-20 DIAGNOSIS — T148XXA Other injury of unspecified body region, initial encounter: Secondary | ICD-10-CM | POA: Diagnosis not present

## 2016-10-20 DIAGNOSIS — Y939 Activity, unspecified: Secondary | ICD-10-CM | POA: Diagnosis not present

## 2016-10-20 DIAGNOSIS — I509 Heart failure, unspecified: Secondary | ICD-10-CM | POA: Diagnosis not present

## 2016-10-20 DIAGNOSIS — M5489 Other dorsalgia: Secondary | ICD-10-CM | POA: Diagnosis not present

## 2016-10-20 MED ORDER — ONDANSETRON 4 MG PO TBDP
4.0000 mg | ORAL_TABLET | Freq: Once | ORAL | Status: AC
  Administered 2016-10-20: 4 mg via ORAL
  Filled 2016-10-20: qty 1

## 2016-10-20 MED ORDER — TRAMADOL HCL 50 MG PO TABS: 50.0000 mg | ORAL_TABLET | Freq: Four times a day (QID) | ORAL | 0 refills | Status: DC | PRN

## 2016-10-20 MED ORDER — HYDROCODONE-ACETAMINOPHEN 5-325 MG PO TABS
1.0000 | ORAL_TABLET | Freq: Once | ORAL | Status: AC
  Administered 2016-10-20: 1 via ORAL
  Filled 2016-10-20: qty 1

## 2016-10-20 NOTE — ED Notes (Signed)
Patient transported to CT 

## 2016-10-20 NOTE — ED Provider Notes (Addendum)
[place] DEPT Provider Note   CSN: [id number] Arrival date & time: 10/20/16  0630     History   Chief Complaint Chief Complaint  Patient presents with  . Fall  . Head Injury    HPI Jacob [name] is a 65 y.o. male.  HPI he presents for evaluation after a fall at home. He was in his living room and fell. Upset the table the TV was offered. He has a contusion to the right temporal, laceration to his right occiput. He states he went back to bed. He waking this morning after sleeping for a few hours. He went into his living room did not immediately recall what happened. He thought that maybe someone had been in his house. He called 911. However he didn't remember being up and falling. He noted some blood on the edge of the table and some blood on his pillow. He now has clear recollection of his events. He has no other areas of pain or injury other than some pain in his left lower back.  Past Medical History:  Diagnosis Date  . Arthritis   . Cataracts, bilateral    immature  . CHF (congestive heart failure) (HCC)    not on any meds  . Chronic back pain    stenosis  . Chronic kidney disease, unspecified   . Coronary atherosclerosis of artery bypass graft   . Coronary atherosclerosis of native coronary artery 2005   s/p surgery  . Drug abuse    hx; tested for cocaine as recently as 2/08. says he is not using drugs now - avoided defib. for this reason   . GERD (gastroesophageal reflux disease)    takes OTC meds as needed  . Glaucoma    uses eye drops daily  . Gout, unspecified    takes Allopurinol daily as well as Colchicine-if needed  . Hepatitis 1967   Hep C  . History of colon polyps    benign  . HTN (hypertension)    takes Coreg,Imdur.and Apresoline daily  . Human immunodeficiency virus (HIV) disease    takes Genvoya daily  . Hyperlipidemia    takes Atorvastatin daily  . Ischemic cardiomyopathy   . Muscle spasm    takes Zanaflex as needed  . Nocturia   .  Peripheral neuropathy (HCC)    takes gabapentin daily  . Pneumonia    hx of  . Shortness of breath dyspnea    rarely but if notices it then with exertion  . Syphilis, unspecified   . Type II or unspecified type diabetes mellitus without mention of complication, not stated as uncontrolled 2004   Lantus daily.Average fasting blood sugar 125-199    Patient Active Problem List   Diagnosis Date Noted  . Protein-calorie malnutrition, severe 08/03/2016  . Severe sepsis ([place]) 08/02/2016  . Pneumonia 08/02/2016  . History of lumbar laminectomy for spinal cord decompression 02/29/2016  . Renal failure (ARF), acute on chronic (HCC) 11/03/2015  . Dehydration 11/03/2015  . Alcohol dependence ([place]) 11/03/2015  . Type 2 diabetes mellitus with vascular disease ([place]) 06/17/2015  . Acute renal failure ([place]) 04/26/2015  . HTN (hypertension) 04/26/2015  . Syncope 04/26/2015  . DM (diabetes mellitus) ([place]) 04/26/2015  . Ischemic cardiomyopathy 05/12/2014  . Diarrhea 09/08/2013  . Shock ([place]) 09/06/2013  . Ulcer of lower extremity ([place]) 09/06/2013  . Community acquired pneumonia 09/06/2013  . Hyponatremia 09/06/2013  . Hyperkalemia 09/06/2013  . AKI (acute kidney injury) ([place]) 09/06/2013  . Hyperglycemia 09/06/2013  .  Elevated transaminase level 09/06/2013  . Chest pain 09/06/2013  . Type II or unspecified type diabetes mellitus with unspecified complication, not stated as uncontrolled 10/22/2012  . Black stool 04/17/2012  . Bunion of left foot 11/25/2011  . Bunion, right foot 11/25/2011  . Polysubstance abuse 07/28/2011  . Hip fracture, left ([place]) 04/04/2011  . Closed fracture of neck of femur ([place]) 04/04/2011  . Insomnia 02/18/2011  . CAD (coronary artery disease) 04/20/2009  . Chronic combined systolic and diastolic congestive heart failure ([place]) 04/20/2009  . HLD (hyperlipidemia) 11/20/2006  . Gout 11/20/2006  . TOBACCO ABUSE 11/20/2006  . Essential hypertension 11/20/2006  . Human  immunodeficiency virus (HIV) disease ([place]) 09/02/2006  . SYPHILIS 09/02/2006    Past Surgical History:  Procedure Laterality Date  . COLONOSCOPY  2013   Dr.John Henrene Pastor   . CORONARY ARTERY BYPASS GRAFT    . laproscopic cholecystectomy  8/07  . left intertrochanteric hip fracture  2004   s/p intermedullary nail placement 2/08  . left transverse mandibular fracture  10/05  . LUMBAR LAMINECTOMY/DECOMPRESSION MICRODISCECTOMY N/A 02/29/2016   Procedure: Left L4-5 Lateral Recess Decompression, Removal Extradural Intraspinal Facet Cyst;  Surgeon: Marybelle Killings, MD;  Location: [place];  Service: Orthopedics;  Laterality: N/A;       Home Medications    Prior to Admission medications   Medication Sig Start Date End Date Taking? Authorizing Provider  allopurinol (ZYLOPRIM) 100 MG tablet Take 1 tablet (100 mg total) by mouth daily. 07/15/16   Gildardo Cranker, DO  aspirin EC 81 MG tablet Take 81 mg by mouth daily.    Historical Provider, MD  atorvastatin (LIPITOR) 20 MG tablet Take 1 tablet (20 mg total) by mouth daily. 07/15/16   Gildardo Cranker, DO  B-D UF III MINI PEN NEEDLES 31G X 5 MM MISC USE 4 TIMES DAILY 05/24/16   Gildardo Cranker, DO  carvedilol (COREG) 25 MG tablet Take 1 tablet (25 mg total) by mouth 2 (two) times daily with a meal. 11/19/15   [name], MD  Choline Fenofibrate 135 MG capsule Take 1 capsule (135 mg total) by mouth daily. 07/15/16   Gildardo Cranker, DO  colchicine 0.6 MG tablet Take 0.6 mg by mouth daily as needed. Reported on 04/04/2016    Historical Provider, MD  elvitegravir-cobicistat-emtricitabine-tenofovir (GENVOYA) 150-150-200-10 MG TABS tablet TAKE 1 TABLET BY MOUTH DAILY WITH BREAKFAST. 01/12/16   [name], MD  gabapentin (NEURONTIN) 300 MG capsule Take 1 capsule (300 mg total) by mouth 3 (three) times daily. 07/25/16   Gildardo Cranker, DO  Glucosamine-Chondroit-Vit C-Mn (GLUCOSAMINE CHONDR 1500 COMPLX PO) Take 2 tablets by mouth daily. Reported on 04/04/2016    Historical  Provider, MD  hydrALAZINE (APRESOLINE) 25 MG tablet take 1 tablet by mouth three times a day 09/23/16   [name], MD  insulin glargine (LANTUS) 100 UNIT/ML injection Inject 60 Units into the skin at bedtime.    Historical Provider, MD  insulin lispro (HUMALOG KWIKPEN) 100 UNIT/ML KiwkPen Inject 0.09 mLs (9 Units total) into the skin 3 (three) times daily. Before Meals DX E11.22 08/17/16   Gildardo Cranker, DO  isosorbide mononitrate (IMDUR) 30 MG 24 hr tablet Take 30 mg by mouth daily. Reported on 04/04/2016    Historical Provider, MD  latanoprost (XALATAN) 0.005 % ophthalmic solution Place 1 drop into both eyes at bedtime. Reported on 04/04/2016 04/23/12   Historical Provider, MD  Naftifine HCl 2 % CREA apply to affected area twice a day 09/23/16  Max T Hyatt, DPM  nitroGLYCERIN (NITROSTAT) 0.3 MG SL tablet Place 1 tablet (0.3 mg total) under the tongue as needed for chest pain (not to exceed 3 tablets in one day.). 02/24/16   [name], MD  omeprazole (PRILOSEC) 20 MG capsule Take 1 capsule (20 mg total) by mouth 2 (two) times daily before a meal. 09/14/16   Gildardo Cranker, DO  ONE TOUCH LANCETS MISC Check BS twice daily. 02/11/16   Gildardo Cranker, DO  ONE TOUCH ULTRA TEST test strip CHECK BLOOD SUGAR TWO TIMES DAILY AS DIRECTED 08/16/16   Gildardo Cranker, DO  traMADol (ULTRAM) 50 MG tablet Take 1 tablet (50 mg total) by mouth every 6 (six) hours as needed. 10/20/16   Tanna Furry, MD  umeclidinium-vilanterol (ANORO ELLIPTA) 62.5-25 MCG/INH AEPB Inhale 1 puff into the lungs daily. 07/15/16   Gildardo Cranker, DO    Family History Family History  Problem Relation Age of Onset  . Heart failure Father   . Hypertension Father   . Diabetes Brother   . Heart attack Brother   . Alzheimer's disease Mother   . Stroke Sister   . Diabetes Sister   . Alzheimer's disease Sister   . Hypertension Brother   . Diabetes Brother   . Drug abuse Brother   . Colon cancer Neg Hx     Social History Social History    Substance Use Topics  . Smoking status: Current Every Day Smoker    Packs/day: 0.50    Years: 41.00    Types: Cigarettes  . Smokeless tobacco: Never Used  . Alcohol use 3.6 oz/week    6 Standard drinks or equivalent per week     Comment: occ.     Allergies   Amphetamines   Review of Systems Review of Systems  Constitutional: Negative for appetite change, chills, diaphoresis, fatigue and fever.  HENT: Negative for mouth sores, sore throat and trouble swallowing.        Headache. Right temporal. "Bump" on his right temple. Laceration to the right occiput  Eyes: Negative for visual disturbance.  Respiratory: Negative for cough, chest tightness, shortness of breath and wheezing.   Cardiovascular: Negative for chest pain.  Gastrointestinal: Negative for abdominal distention, abdominal pain, diarrhea, nausea and vomiting.  Endocrine: Negative for polydipsia, polyphagia and polyuria.  Genitourinary: Negative for dysuria, frequency and hematuria.  Musculoskeletal: Positive for back pain. Negative for gait problem.  Skin: Negative for color change, pallor and rash.  Neurological: Negative for dizziness, syncope, light-headedness and headaches.  Hematological: Does not bruise/bleed easily.  Psychiatric/Behavioral: Negative for behavioral problems and confusion.     Physical Exam Updated Vital Signs BP 162/90   Pulse 90   Resp 21   Ht 5\' 10"  (1.778 m)   Wt 190 lb (86.2 kg)   SpO2 96%   BMI 27.26 kg/m   Physical Exam  Constitutional: He is oriented to person, place, and time. He appears well-developed and well-nourished. No distress.  HENT:  Head: Normocephalic. Head is with contusion and with laceration. Head is without raccoon's eyes and without Battle's sign.  Right occipital laceration, right temporal contusion. No bladder or TMs, mastoids, or from ears nose or mouth. Symmetric reactive pupils. Intact cranial nerves.  Eyes: Conjunctivae are normal. Pupils are equal,  round, and reactive to light. No scleral icterus.  Neck: Normal range of motion. Neck supple. No thyromegaly present.  Cardiovascular: Normal rate and regular rhythm.  Exam reveals no gallop and no friction rub.  No murmur heard. Pulmonary/Chest: Effort normal and breath sounds normal. No respiratory distress. He has no wheezes. He has no rales.  Abdominal: Soft. Bowel sounds are normal. He exhibits no distension. There is no tenderness. There is no rebound.  Musculoskeletal: Normal range of motion.       Back:  Tinnitus in the musculature of the left lower lumbar paraspinal muscle. No midline spinal tenderness. No point tenderness of the iliac crest or hips.  Neurological: He is alert and oriented to person, place, and time.  Skin: Skin is warm and dry. No rash noted.  Psychiatric: He has a normal mood and affect. His behavior is normal.     ED Treatments / Results  Labs (all labs ordered are listed, but only abnormal results are displayed) Labs Reviewed - No data to display  EKG  EKG Interpretation None       Radiology Ct Head Wo Contrast  Result Date: 10/20/2016 CLINICAL DATA:  Unwitnessed fall at 2 a.m. with possible loss of consciousness. Right occipital scalp laceration. Hit head on glass table. EXAM: CT HEAD WITHOUT CONTRAST TECHNIQUE: Contiguous axial images were obtained from the base of the skull through the vertex without intravenous contrast. COMPARISON:  CT head 04/26/2015 FINDINGS: Brain: No acute infarct, hemorrhage, or mass lesion is present. The ventricles are of normal size. No significant extraaxial fluid collection is present. The basal ganglia are intact. No focal cortical lesions are present. Vascular: Dense atherosclerotic calcifications are present within the cavernous internal carotid arteries and at the dural margin of the vertebral arteries. There is no hyperdense vessel. Skull: The calvarium is intact. No acute fracture is present. No focal lytic or  blastic lesions are evident. Sinuses/Orbits: Mild mucosal thickening is present in the anterior ethmoid air cells and inferior frontal sinuses. The paranasal sinuses and mastoid air cells are otherwise clear. The globes and orbits are within normal limits. Other: Soft tissue swelling is present in the occipital scalp without an underlying fracture or foreign body. IMPRESSION: 1. Soft tissue swelling within the occipital scalp without underlying fracture or radiopaque foreign body. 2. Normal CT appearance of the brain for age. 3. Atherosclerosis. Electronically Signed   By: [name] Morelle M.D.   On: 10/20/2016 07:23    Procedures Procedures (including critical care time)  Medications Ordered in ED Medications  ondansetron (ZOFRAN-ODT) disintegrating tablet 4 mg (4 mg Oral Given 10/20/16 0707)  HYDROcodone-acetaminophen (NORCO/VICODIN) 5-325 MG per tablet 1 tablet (1 tablet Oral Given 10/20/16 0707)     Initial Impression / Assessment and Plan / ED Course  I have reviewed the triage vital signs and the nursing notes.  Pertinent labs & imaging results that were available during my care of the patient were reviewed by me and considered in my medical decision making (see chart for details).  Clinical Course     Plan CT head. PO zofran and vicoden. : :  LACERATION REPAIR Performed by: Lolita Patella Authorized by: Lolita Patella Consent: Verbal consent obtained. Risks and benefits: risks, benefits and alternatives were discussed Consent given by: patient Patient identity confirmed: provided demographic data Prepped and Draped in normal sterile fashion Wound explored  Laceration Location: Occipital scalp  Laceration Length: 3cm  No Foreign Bodies seen or palpated  Anesthesia: local infiltration  Local anesthetic: lidocaine 1% s epinephrine  Anesthetic total: 2 ml  Irrigation method: syringe Amount of cleaning: standard  Skin closure: Staples  Number of  sutures: 6  Technique: Staples  Patient tolerance: Patient  tolerated the procedure well with no immediate complications.   Final Clinical Impressions(s) / ED Diagnoses   Final diagnoses:  Laceration of scalp, initial encounter    New Prescriptions New Prescriptions   TRAMADOL (ULTRAM) 50 MG TABLET    Take 1 tablet (50 mg total) by mouth every 6 (six) hours as needed.     Tanna Furry, MD 10/20/16 [id number]    Tanna Furry, MD 10/20/16 [phone]

## 2016-10-20 NOTE — ED Triage Notes (Signed)
Pt in from home via [facility] after having unwitnessed fall at 0200, with possible LOC. Per EMS, pt just remembers waking up on the floor, pt had hit head on glass table and broken it, sustained lac to posterior head, also c/o L flank pain. Pt endorses drinking ETOH last night. BP 182/96, 90HR, 18RR for EMS.

## 2016-10-20 NOTE — ED Notes (Signed)
Pt getting dressed -- sts needed no assistance

## 2016-10-20 NOTE — Discharge Instructions (Signed)
Staple removal in 7-10 days with PCP  Ultram for pain

## 2016-10-21 ENCOUNTER — Encounter (HOSPITAL_COMMUNITY): Payer: Self-pay

## 2016-10-21 ENCOUNTER — Emergency Department (HOSPITAL_COMMUNITY): Payer: Medicare Other

## 2016-10-21 ENCOUNTER — Emergency Department (HOSPITAL_COMMUNITY)
Admission: EM | Admit: 2016-10-21 | Discharge: 2016-10-22 | Disposition: A | Discharge status: Home / Self Care | Payer: Medicare Other | Attending: Emergency Medicine | Admitting: Emergency Medicine

## 2016-10-21 DIAGNOSIS — E1142 Type 2 diabetes mellitus with diabetic polyneuropathy: Secondary | ICD-10-CM | POA: Insufficient documentation

## 2016-10-21 DIAGNOSIS — S060X0D Concussion without loss of consciousness, subsequent encounter: Secondary | ICD-10-CM | POA: Diagnosis not present

## 2016-10-21 DIAGNOSIS — I10 Essential (primary) hypertension: Secondary | ICD-10-CM | POA: Diagnosis not present

## 2016-10-21 DIAGNOSIS — F10920 Alcohol use, unspecified with intoxication, uncomplicated: Secondary | ICD-10-CM

## 2016-10-21 DIAGNOSIS — I130 Hypertensive heart and chronic kidney disease with heart failure and stage 1 through stage 4 chronic kidney disease, or unspecified chronic kidney disease: Secondary | ICD-10-CM | POA: Diagnosis not present

## 2016-10-21 DIAGNOSIS — I2510 Atherosclerotic heart disease of native coronary artery without angina pectoris: Secondary | ICD-10-CM | POA: Insufficient documentation

## 2016-10-21 DIAGNOSIS — Z7982 Long term (current) use of aspirin: Secondary | ICD-10-CM | POA: Insufficient documentation

## 2016-10-21 DIAGNOSIS — R519 Headache, unspecified: Secondary | ICD-10-CM

## 2016-10-21 DIAGNOSIS — E1140 Type 2 diabetes mellitus with diabetic neuropathy, unspecified: Secondary | ICD-10-CM | POA: Diagnosis not present

## 2016-10-21 DIAGNOSIS — S0990XD Unspecified injury of head, subsequent encounter: Secondary | ICD-10-CM | POA: Diagnosis present

## 2016-10-21 DIAGNOSIS — I5042 Chronic combined systolic (congestive) and diastolic (congestive) heart failure: Secondary | ICD-10-CM | POA: Diagnosis not present

## 2016-10-21 DIAGNOSIS — E1122 Type 2 diabetes mellitus with diabetic chronic kidney disease: Secondary | ICD-10-CM | POA: Diagnosis not present

## 2016-10-21 DIAGNOSIS — F17210 Nicotine dependence, cigarettes, uncomplicated: Secondary | ICD-10-CM | POA: Insufficient documentation

## 2016-10-21 DIAGNOSIS — W19XXXD Unspecified fall, subsequent encounter: Secondary | ICD-10-CM | POA: Insufficient documentation

## 2016-10-21 DIAGNOSIS — Y908 Blood alcohol level of 240 mg/100 ml or more: Secondary | ICD-10-CM | POA: Diagnosis not present

## 2016-10-21 DIAGNOSIS — Z951 Presence of aortocoronary bypass graft: Secondary | ICD-10-CM | POA: Diagnosis not present

## 2016-10-21 DIAGNOSIS — S3992XA Unspecified injury of lower back, initial encounter: Secondary | ICD-10-CM | POA: Diagnosis not present

## 2016-10-21 DIAGNOSIS — T1490XA Injury, unspecified, initial encounter: Secondary | ICD-10-CM | POA: Diagnosis not present

## 2016-10-21 DIAGNOSIS — N189 Chronic kidney disease, unspecified: Secondary | ICD-10-CM | POA: Diagnosis not present

## 2016-10-21 DIAGNOSIS — F10120 Alcohol abuse with intoxication, uncomplicated: Secondary | ICD-10-CM | POA: Diagnosis not present

## 2016-10-21 DIAGNOSIS — Z79899 Other long term (current) drug therapy: Secondary | ICD-10-CM | POA: Insufficient documentation

## 2016-10-21 DIAGNOSIS — R51 Headache: Secondary | ICD-10-CM | POA: Diagnosis not present

## 2016-10-21 DIAGNOSIS — Z794 Long term (current) use of insulin: Secondary | ICD-10-CM | POA: Insufficient documentation

## 2016-10-21 DIAGNOSIS — J111 Influenza due to unidentified influenza virus with other respiratory manifestations: Secondary | ICD-10-CM | POA: Diagnosis not present

## 2016-10-21 DIAGNOSIS — S060X0A Concussion without loss of consciousness, initial encounter: Secondary | ICD-10-CM | POA: Diagnosis not present

## 2016-10-21 DIAGNOSIS — S0990XA Unspecified injury of head, initial encounter: Secondary | ICD-10-CM | POA: Diagnosis not present

## 2016-10-21 DIAGNOSIS — M545 Low back pain: Secondary | ICD-10-CM | POA: Diagnosis not present

## 2016-10-21 DIAGNOSIS — S39012A Strain of muscle, fascia and tendon of lower back, initial encounter: Secondary | ICD-10-CM | POA: Diagnosis not present

## 2016-10-21 LAB — BASIC METABOLIC PANEL
ANION GAP: 17 — AB (ref 5–15)
BUN: 27 mg/dL — ABNORMAL HIGH (ref 6–20)
CHLORIDE: 102 mmol/L (ref 101–111)
CO2: 20 mmol/L — AB (ref 22–32)
CREATININE: 2.02 mg/dL — AB (ref 0.61–1.24)
Calcium: 8.8 mg/dL — ABNORMAL LOW (ref 8.9–10.3)
GFR calc non Af Amer: 32 mL/min — ABNORMAL LOW (ref >60)
GFR, EST AFRICAN AMERICAN: 38 mL/min — AB (ref >60)
Glucose, Bld: 277 mg/dL — ABNORMAL HIGH (ref 65–99)
POTASSIUM: 4.4 mmol/L (ref 3.5–5.1)
SODIUM: 139 mmol/L (ref 135–145)

## 2016-10-21 LAB — RAPID URINE DRUG SCREEN, HOSP PERFORMED
Amphetamines: NOT DETECTED
BARBITURATES: NOT DETECTED
BENZODIAZEPINES: NOT DETECTED
COCAINE: POSITIVE — AB
Opiates: POSITIVE — AB
TETRAHYDROCANNABINOL: NOT DETECTED

## 2016-10-21 LAB — URINALYSIS, ROUTINE W REFLEX MICROSCOPIC
BILIRUBIN URINE: NEGATIVE
Bacteria, UA: NONE SEEN
GLUCOSE, UA: 150 mg/dL — AB
Ketones, ur: NEGATIVE mg/dL
LEUKOCYTES UA: NEGATIVE
NITRITE: NEGATIVE
PH: 5 (ref 5.0–8.0)
Protein, ur: >=300 mg/dL — AB
SPECIFIC GRAVITY, URINE: 1.02 (ref 1.005–1.030)
Squamous Epithelial / LPF: NONE SEEN

## 2016-10-21 LAB — CBC
HCT: 42.9 % (ref 39.0–52.0)
Hemoglobin: 14.7 g/dL (ref 13.0–17.0)
MCH: 30.4 pg (ref 26.0–34.0)
MCHC: 34.3 g/dL (ref 30.0–36.0)
MCV: 88.6 fL (ref 78.0–100.0)
PLATELETS: 219 10*3/uL (ref 150–400)
RBC: 4.84 MIL/uL (ref 4.22–5.81)
RDW: 13.5 % (ref 11.5–15.5)
WBC: 7.2 10*3/uL (ref 4.0–10.5)

## 2016-10-21 LAB — TROPONIN I: Troponin I: 0.05 ng/mL (ref <0.03)

## 2016-10-21 LAB — ETHANOL: Alcohol, Ethyl (B): 353 mg/dL (ref <5)

## 2016-10-21 MED ORDER — SODIUM CHLORIDE 0.9 % IV BOLUS (SEPSIS)
1000.0000 mL | Freq: Once | INTRAVENOUS | Status: AC
  Administered 2016-10-21: 1000 mL via INTRAVENOUS

## 2016-10-21 MED ORDER — BUTALBITAL-APAP-CAFFEINE 50-325-40 MG PO TABS
2.0000 | ORAL_TABLET | Freq: Once | ORAL | Status: AC
  Administered 2016-10-21: 2 via ORAL
  Filled 2016-10-21: qty 2

## 2016-10-21 NOTE — ED Notes (Signed)
Called pt for vitals update in lobby. No answer.

## 2016-10-21 NOTE — ED Provider Notes (Signed)
[facility] DEPT Provider Note   CSN: [id number] Arrival date & time: 10/21/16  2033   By signing my name below, I, Dolores Hoose, attest that this documentation has been prepared under the direction and in the presence of Deno Etienne, DO . Electronically Signed: Dolores Hoose, Scribe. 10/21/2016. 11:06 PM.  bidirect nystag Ataxic gait 6 stitchews to r occiput with well closed lac no dr no ery History   Chief Complaint Chief Complaint  Patient presents with  . Headache  . Tachycardia   The history is provided by the patient. No language interpreter was used.  Headache   This is a new problem. The current episode started yesterday. The problem occurs constantly. The problem has been gradually worsening. The headache is associated with nothing. The pain is located in the occipital region. The pain is at a severity of 5/10. The pain does not radiate. Pertinent negatives include no shortness of breath. He has tried nothing for the symptoms. The treatment provided no relief.    HPI Comments:  ATTIKUS BARTOSZEK is a 65 y.o. male with pmhx of HTN, HIV, CHF and DM who presents to the Emergency Department complaining of constant worsened moderate posterior head pain s/p fall occurring 5 days ago. He describes his pain as a 5/10 pain with no modifying factors. Pt was seen for a laceration to his head and currently has staples in his wound. He states the staples "aren't acting great" and that his pain has worsened since he got the staples. He has not tried anything for his pain. Pt denies any CP, SOB, or neck pain. He states he was diagnosed "downstairs" with a heart problem and is also minorly concerned about his heart. Pt is a poor historian.  He secondarily complains of some left leg pain beginning after his fall. Pt has been able to ambulate without any difficulty.   Past Medical History:  Diagnosis Date  . Arthritis   . Cataracts, bilateral    immature  . CHF (congestive heart failure)  (HCC)    not on any meds  . Chronic back pain    stenosis  . Chronic kidney disease, unspecified   . Coronary atherosclerosis of artery bypass graft   . Coronary atherosclerosis of native coronary artery 2005   s/p surgery  . Drug abuse    hx; tested for cocaine as recently as 2/08. says he is not using drugs now - avoided defib. for this reason   . GERD (gastroesophageal reflux disease)    takes OTC meds as needed  . Glaucoma    uses eye drops daily  . Gout, unspecified    takes Allopurinol daily as well as Colchicine-if needed  . Hepatitis 1967   Hep C  . History of colon polyps    benign  . HTN (hypertension)    takes Coreg,Imdur.and Apresoline daily  . Human immunodeficiency virus (HIV) disease    takes Genvoya daily  . Hyperlipidemia    takes Atorvastatin daily  . Ischemic cardiomyopathy   . Muscle spasm    takes Zanaflex as needed  . Nocturia   . Peripheral neuropathy (HCC)    takes gabapentin daily  . Pneumonia    hx of  . Shortness of breath dyspnea    rarely but if notices it then with exertion  . Syphilis, unspecified   . Type II or unspecified type diabetes mellitus without mention of complication, not stated as uncontrolled 2004   Lantus daily.Average fasting blood sugar 125-199  Patient Active Problem List   Diagnosis Date Noted  . Protein-calorie malnutrition, severe 08/03/2016  . Severe sepsis ([place]) 08/02/2016  . Pneumonia 08/02/2016  . History of lumbar laminectomy for spinal cord decompression 02/29/2016  . Renal failure (ARF), acute on chronic (HCC) 11/03/2015  . Dehydration 11/03/2015  . Alcohol dependence ([place]) 11/03/2015  . Type 2 diabetes mellitus with vascular disease ([place]) 06/17/2015  . Acute renal failure ([place]) 04/26/2015  . HTN (hypertension) 04/26/2015  . Syncope 04/26/2015  . DM (diabetes mellitus) ([place]) 04/26/2015  . Ischemic cardiomyopathy 05/12/2014  . Diarrhea 09/08/2013  . Shock ([place]) 09/06/2013  . Ulcer of lower extremity  ([place]) 09/06/2013  . Community acquired pneumonia 09/06/2013  . Hyponatremia 09/06/2013  . Hyperkalemia 09/06/2013  . AKI (acute kidney injury) ([place]) 09/06/2013  . Hyperglycemia 09/06/2013  . Elevated transaminase level 09/06/2013  . Chest pain 09/06/2013  . Type II or unspecified type diabetes mellitus with unspecified complication, not stated as uncontrolled 10/22/2012  . Black stool 04/17/2012  . Bunion of left foot 11/25/2011  . Bunion, right foot 11/25/2011  . Polysubstance abuse 07/28/2011  . Hip fracture, left ([place]) 04/04/2011  . Closed fracture of neck of femur ([employer]) 04/04/2011  . Insomnia 02/18/2011  . CAD (coronary artery disease) 04/20/2009  . Chronic combined systolic and diastolic congestive heart failure ([place]) 04/20/2009  . HLD (hyperlipidemia) 11/20/2006  . Gout 11/20/2006  . TOBACCO ABUSE 11/20/2006  . Essential hypertension 11/20/2006  . Human immunodeficiency virus (HIV) disease ([place]) 09/02/2006  . SYPHILIS 09/02/2006    Past Surgical History:  Procedure Laterality Date  . COLONOSCOPY  2013   Dr.John Henrene Pastor   . CORONARY ARTERY BYPASS GRAFT    . laproscopic cholecystectomy  8/07  . left intertrochanteric hip fracture  2004   s/p intermedullary nail placement 2/08  . left transverse mandibular fracture  10/05  . LUMBAR LAMINECTOMY/DECOMPRESSION MICRODISCECTOMY N/A 02/29/2016   Procedure: Left L4-5 Lateral Recess Decompression, Removal Extradural Intraspinal Facet Cyst;  Surgeon: Marybelle Killings, MD;  Location: [facility];  Service: Orthopedics;  Laterality: N/A;       Home Medications    Prior to Admission medications   Medication Sig Start Date End Date Taking? Authorizing Provider  allopurinol (ZYLOPRIM) 100 MG tablet Take 1 tablet (100 mg total) by mouth daily. 07/15/16  Yes Gildardo Cranker, DO  aspirin EC 81 MG tablet Take 81 mg by mouth daily.   Yes Historical Provider, MD  atorvastatin (LIPITOR) 20 MG tablet Take 1 tablet (20 mg total) by mouth daily. 07/15/16   Yes Gildardo Cranker, DO  carvedilol (COREG) 25 MG tablet Take 1 tablet (25 mg total) by mouth 2 (two) times daily with a meal. 11/19/15  Yes [name], MD  Choline Fenofibrate 135 MG capsule Take 1 capsule (135 mg total) by mouth daily. 07/15/16  Yes Gildardo Cranker, DO  colchicine 0.6 MG tablet Take 0.6 mg by mouth daily as needed. Reported on 04/04/2016   Yes Historical Provider, MD  elvitegravir-cobicistat-emtricitabine-tenofovir (GENVOYA) 150-150-200-10 MG TABS tablet TAKE 1 TABLET BY MOUTH DAILY WITH BREAKFAST. 01/12/16  Yes [name], MD  gabapentin (NEURONTIN) 300 MG capsule Take 1 capsule (300 mg total) by mouth 3 (three) times daily. 07/25/16  Yes Gildardo Cranker, DO  Glucosamine-Chondroit-Vit C-Mn (GLUCOSAMINE CHONDR 1500 COMPLX PO) Take 2 tablets by mouth daily. Reported on 04/04/2016   Yes Historical Provider, MD  hydrALAZINE (APRESOLINE) 25 MG tablet take 1 tablet by mouth three times a day 09/23/16  Yes Collier Salina  Tommy Rainwater, MD  insulin glargine (LANTUS) 100 UNIT/ML injection Inject 60 Units into the skin at bedtime.   Yes Historical Provider, MD  insulin lispro (HUMALOG KWIKPEN) 100 UNIT/ML KiwkPen Inject 0.09 mLs (9 Units total) into the skin 3 (three) times daily. Before Meals DX E11.22 08/17/16  Yes Gildardo Cranker, DO  isosorbide mononitrate (IMDUR) 30 MG 24 hr tablet Take 30 mg by mouth daily. Reported on 04/04/2016   Yes Historical Provider, MD  latanoprost (XALATAN) 0.005 % ophthalmic solution Place 1 drop into both eyes at bedtime. Reported on 04/04/2016 04/23/12  Yes Historical Provider, MD  Naftifine HCl 2 % CREA apply to affected area twice a day 09/23/16  Yes Max T Hyatt, DPM  nitroGLYCERIN (NITROSTAT) 0.3 MG SL tablet Place 1 tablet (0.3 mg total) under the tongue as needed for chest pain (not to exceed 3 tablets in one day.). 02/24/16  Yes [name], MD  omeprazole (PRILOSEC) 20 MG capsule Take 1 capsule (20 mg total) by mouth 2 (two) times daily before a meal. 09/14/16  Yes Gildardo Cranker, DO  umeclidinium-vilanterol (ANORO ELLIPTA) 62.5-25 MCG/INH AEPB Inhale 1 puff into the lungs daily. 07/15/16  Yes Gildardo Cranker, DO  B-D UF III MINI PEN NEEDLES 31G X 5 MM MISC USE 4 TIMES DAILY 05/24/16   Gildardo Cranker, DO  ONE TOUCH LANCETS MISC Check BS twice daily. 02/11/16   Gildardo Cranker, DO  ONE TOUCH ULTRA TEST test strip CHECK BLOOD SUGAR TWO TIMES DAILY AS DIRECTED 08/16/16   Gildardo Cranker, DO  traMADol (ULTRAM) 50 MG tablet Take 1 tablet (50 mg total) by mouth every 6 (six) hours as needed. 10/20/16   Tanna Furry, MD    Family History Family History  Problem Relation Age of Onset  . Heart failure Father   . Hypertension Father   . Diabetes Brother   . Heart attack Brother   . Alzheimer's disease Mother   . Stroke Sister   . Diabetes Sister   . Alzheimer's disease Sister   . Hypertension Brother   . Diabetes Brother   . Drug abuse Brother   . Colon cancer Neg Hx     Social History Social History  Substance Use Topics  . Smoking status: Current Every Day Smoker    Packs/day: 0.50    Years: 41.00    Types: Cigarettes  . Smokeless tobacco: Never Used  . Alcohol use 3.6 oz/week    6 Standard drinks or equivalent per week     Comment: occ.     Allergies   Amphetamines   Review of Systems Review of Systems  Respiratory: Negative for shortness of breath.   Cardiovascular: Negative for chest pain.  Musculoskeletal: Negative for neck pain.  Neurological: Positive for headaches.  All other systems reviewed and are negative.    Physical Exam Updated Vital Signs BP 171/82   Pulse 103   Temp 98.7 F (37.1 C) (Oral)   Resp (!) 27   Ht 6\' 2"  (1.88 m)   Wt 190 lb (86.2 kg)   SpO2 96%   BMI 24.39 kg/m   Physical Exam  Constitutional: He is oriented to person, place, and time. He appears well-developed and well-nourished.  HENT:  Head: Normocephalic and atraumatic.  Bidirectional nystagmus.   Eyes: EOM are normal. Pupils are equal, round, and  reactive to light.  Neck: Normal range of motion. Neck supple. No JVD present.  Cardiovascular: Normal rate and regular rhythm.  Exam reveals no gallop and  no friction rub.   No murmur heard. Pulmonary/Chest: No respiratory distress. He has no wheezes.  Abdominal: He exhibits no distension. There is no rebound and no guarding.  Musculoskeletal: Normal range of motion.  Neurological: He is alert and oriented to person, place, and time.  Ataxic gait  Skin: No rash noted. No pallor.  6 stitches to right occiput with well closed lac. No drainage. No erythema.  Psychiatric: He has a normal mood and affect. His behavior is normal.  Nursing note and vitals reviewed.    ED Treatments / Results  DIAGNOSTIC STUDIES:  Oxygen Saturation is 95% on RA, adequate by my interpretation.    COORDINATION OF CARE:  11:36 PM Discussed treatment plan with pt at bedside and pt agreed to plan.  Labs (all labs ordered are listed, but only abnormal results are displayed) Labs Reviewed  BASIC METABOLIC PANEL - Abnormal; Notable for the following:       Result Value   CO2 20 (*)    Glucose, Bld 277 (*)    BUN 27 (*)    Creatinine, Ser 2.02 (*)    Calcium 8.8 (*)    GFR calc non Af Amer 32 (*)    GFR calc Af Amer 38 (*)    Anion gap 17 (*)    All other components within normal limits  ETHANOL - Abnormal; Notable for the following:    Alcohol, Ethyl (B) 353 (*)    All other components within normal limits  TROPONIN I - Abnormal; Notable for the following:    Troponin I 0.05 (*)    All other components within normal limits  URINALYSIS, ROUTINE W REFLEX MICROSCOPIC - Abnormal; Notable for the following:    Glucose, UA 150 (*)    Hgb urine dipstick MODERATE (*)    Protein, ur >=300 (*)    All other components within normal limits  RAPID URINE DRUG SCREEN, HOSP PERFORMED - Abnormal; Notable for the following:    Opiates POSITIVE (*)    Cocaine POSITIVE (*)    All other components within normal  limits  CBC  I-STAT TROPOININ, ED    EKG  EKG Interpretation  Date/Time:  Friday October 21 2016 22:36:06 EST Ventricular Rate:  108 PR Interval:  152 QRS Duration: 92 QT Interval:  372 QTC Calculation: 498 R Axis:   6 Text Interpretation:  Sinus tachycardia with occasional Premature ventricular complexes Possible Left atrial enlargement Possible Inferior infarct , age undetermined Abnormal ECG Confirmed by Alvino Chapel  MD, NATHAN [id number]) on 10/21/2016 10:41:39 PM       Radiology Ct Head Wo Contrast  Result Date: 10/21/2016 CLINICAL DATA:  Fall.  History of HIV, diabetes, hypertension. EXAM: CT HEAD WITHOUT CONTRAST TECHNIQUE: Contiguous axial images were obtained from the base of the skull through the vertex without intravenous contrast. COMPARISON:  CT HEAD October 20, 2016 FINDINGS: BRAIN: The ventricles and sulci are normal for age. No intraparenchymal hemorrhage, mass effect nor midline shift. Patchy supratentorial white matter hypodensities less than expected for patient's age, though non-specific are most compatible with chronic small vessel ischemic disease. No acute large vascular territory infarcts. No abnormal extra-axial fluid collections. Basal cisterns are patent. VASCULAR: Moderate calcific atherosclerosis of the carotid siphons. SKULL: No skull fracture. Small RIGHT frontal and RIGHT occipital scalp hematomas with RIGHT occipital scalp skin staples. SINUSES/ORBITS: The mastoid air-cells and included paranasal sinuses are well-aerated.The included ocular globes and orbital contents are non-suspicious. OTHER: None. IMPRESSION: No acute intracranial process ; negative  CT HEAD for age. Electronically Signed   By: Elon Alas M.D.   On: 10/21/2016 23:54   Ct Head Wo Contrast  Result Date: 10/20/2016 CLINICAL DATA:  Unwitnessed fall at 2 a.m. with possible loss of consciousness. Right occipital scalp laceration. Hit head on glass table. EXAM: CT HEAD WITHOUT CONTRAST  TECHNIQUE: Contiguous axial images were obtained from the base of the skull through the vertex without intravenous contrast. COMPARISON:  CT head 04/26/2015 FINDINGS: Brain: No acute infarct, hemorrhage, or mass lesion is present. The ventricles are of normal size. No significant extraaxial fluid collection is present. The basal ganglia are intact. No focal cortical lesions are present. Vascular: Dense atherosclerotic calcifications are present within the cavernous internal carotid arteries and at the dural margin of the vertebral arteries. There is no hyperdense vessel. Skull: The calvarium is intact. No acute fracture is present. No focal lytic or blastic lesions are evident. Sinuses/Orbits: Mild mucosal thickening is present in the anterior ethmoid air cells and inferior frontal sinuses. The paranasal sinuses and mastoid air cells are otherwise clear. The globes and orbits are within normal limits. Other: Soft tissue swelling is present in the occipital scalp without an underlying fracture or foreign body. IMPRESSION: 1. Soft tissue swelling within the occipital scalp without underlying fracture or radiopaque foreign body. 2. Normal CT appearance of the brain for age. 3. Atherosclerosis. Electronically Signed   By: [name] Morelle M.D.   On: 10/20/2016 07:23    Procedures Procedures (including critical care time)  Medications Ordered in ED Medications  sodium chloride 0.9 % bolus 1,000 mL (not administered)  sodium chloride 0.9 % bolus 1,000 mL (1,000 mLs Intravenous New Bag/Given 10/21/16 2344)  butalbital-acetaminophen-caffeine (FIORICET, ESGIC) 50-325-40 MG per tablet 2 tablet (2 tablets Oral Given 10/21/16 2322)     Initial Impression / Assessment and Plan / ED Course  I have reviewed the triage vital signs and the nursing notes.  Pertinent labs & imaging results that were available during my care of the patient were reviewed by me and considered in my medical decision making (see chart  for details).  Clinical Course     65 yo M With a chief complaint of a headache. Patient was seen yesterday for a fall that he had a couple days before. He had a head laceration that was repaired with staples. Patient felt that that area has had worsening pain. He denies any difficulty with coordination recurrent falls or other injuries. He is tachycardic when he arrived in triage and got an EKG that showed some possible ST changes. He had no chest pain or shortness of breath. Troponins were drawn and appear to be at his baseline.  The patient has been reassessed multiple times and denies chest pain or shortness breath. Patient is also noted to be significantly tachycardic. This may be due to his cocaine positive result. He had improvement throughout his ED stay. The patient is requesting discharge. He is able to ambulate is able to answer questions and described his course in the ED. Discharge home.   1:30 AM:  I have discussed the diagnosis/risks/treatment options with the patient and believe the pt to be eligible for discharge home to follow-up with PCP. We also discussed returning to the ED immediately if new or worsening sx occur. We discussed the sx which are most concerning (e.g., sudden worsening pain, fever, inability to tolerate by mouth) that necessitate immediate return. Medications administered to the patient during their visit and any new prescriptions provided  to the patient are listed below.  Medications given during this visit Medications  sodium chloride 0.9 % bolus 1,000 mL (not administered)  sodium chloride 0.9 % bolus 1,000 mL (1,000 mLs Intravenous New Bag/Given 10/21/16 2344)  butalbital-acetaminophen-caffeine (FIORICET, ESGIC) 50-325-40 MG per tablet 2 tablet (2 tablets Oral Given 10/21/16 2322)     The patient appears reasonably screen and/or stabilized for discharge and I doubt any other medical condition or other [facility] requiring further screening, evaluation, or treatment  in the ED at this time prior to discharge.    Final Clinical Impressions(s) / ED Diagnoses   Final diagnoses:  Acute nonintractable headache, unspecified headache type  Concussion without loss of consciousness, subsequent encounter  Alcoholic intoxication without complication ([place])    New Prescriptions New Prescriptions   No medications on file   I personally performed the services described in this documentation, which was scribed in my presence. The recorded information has been reviewed and is accurate.     Deno Etienne, DO 10/22/16 0130

## 2016-10-21 NOTE — ED Notes (Signed)
Pt found in triage

## 2016-10-21 NOTE — ED Notes (Signed)
Dr. Leonette Monarch notified on pt.'s elevated ETOH level.

## 2016-10-21 NOTE — ED Triage Notes (Signed)
Pt presents with lac to back of head, pt with new staples to lac. Pt states seen here this morning.  Pt states having problems obtaining meds. Pt will not specify which meds pt needs help with. Pt a/o x 4, easily distracted. Pt afebrile at triage, is tachy. Pt denies any chest pain or SOB.

## 2016-10-21 NOTE — ED Notes (Signed)
Second attempt. Called pt for vitals update in lobby. No response.

## 2016-10-22 ENCOUNTER — Emergency Department (HOSPITAL_COMMUNITY): Payer: Medicare Other

## 2016-10-22 ENCOUNTER — Emergency Department (HOSPITAL_COMMUNITY)
Admission: EM | Admit: 2016-10-22 | Discharge: 2016-10-22 | Disposition: A | Discharge status: Home / Self Care | Payer: Medicare Other | Attending: Emergency Medicine | Admitting: Emergency Medicine

## 2016-10-22 ENCOUNTER — Emergency Department (HOSPITAL_COMMUNITY)
Admission: EM | Admit: 2016-10-22 | Discharge: 2016-10-23 | Disposition: A | Discharge status: Home / Self Care | Payer: Medicare Other | Attending: Emergency Medicine | Admitting: Emergency Medicine

## 2016-10-22 ENCOUNTER — Encounter (HOSPITAL_COMMUNITY): Payer: Self-pay | Admitting: Emergency Medicine

## 2016-10-22 DIAGNOSIS — I2510 Atherosclerotic heart disease of native coronary artery without angina pectoris: Secondary | ICD-10-CM | POA: Insufficient documentation

## 2016-10-22 DIAGNOSIS — Y999 Unspecified external cause status: Secondary | ICD-10-CM | POA: Diagnosis not present

## 2016-10-22 DIAGNOSIS — Z7982 Long term (current) use of aspirin: Secondary | ICD-10-CM | POA: Diagnosis not present

## 2016-10-22 DIAGNOSIS — M545 Low back pain: Secondary | ICD-10-CM | POA: Diagnosis not present

## 2016-10-22 DIAGNOSIS — J111 Influenza due to unidentified influenza virus with other respiratory manifestations: Secondary | ICD-10-CM | POA: Insufficient documentation

## 2016-10-22 DIAGNOSIS — I5042 Chronic combined systolic (congestive) and diastolic (congestive) heart failure: Secondary | ICD-10-CM | POA: Diagnosis not present

## 2016-10-22 DIAGNOSIS — I130 Hypertensive heart and chronic kidney disease with heart failure and stage 1 through stage 4 chronic kidney disease, or unspecified chronic kidney disease: Secondary | ICD-10-CM | POA: Diagnosis not present

## 2016-10-22 DIAGNOSIS — R109 Unspecified abdominal pain: Secondary | ICD-10-CM | POA: Diagnosis not present

## 2016-10-22 DIAGNOSIS — S39012A Strain of muscle, fascia and tendon of lower back, initial encounter: Secondary | ICD-10-CM | POA: Diagnosis not present

## 2016-10-22 DIAGNOSIS — Z794 Long term (current) use of insulin: Secondary | ICD-10-CM | POA: Diagnosis not present

## 2016-10-22 DIAGNOSIS — Z79899 Other long term (current) drug therapy: Secondary | ICD-10-CM | POA: Insufficient documentation

## 2016-10-22 DIAGNOSIS — W1839XA Other fall on same level, initial encounter: Secondary | ICD-10-CM | POA: Diagnosis not present

## 2016-10-22 DIAGNOSIS — N189 Chronic kidney disease, unspecified: Secondary | ICD-10-CM | POA: Insufficient documentation

## 2016-10-22 DIAGNOSIS — Y929 Unspecified place or not applicable: Secondary | ICD-10-CM | POA: Diagnosis not present

## 2016-10-22 DIAGNOSIS — E1122 Type 2 diabetes mellitus with diabetic chronic kidney disease: Secondary | ICD-10-CM | POA: Diagnosis not present

## 2016-10-22 DIAGNOSIS — R531 Weakness: Secondary | ICD-10-CM | POA: Diagnosis not present

## 2016-10-22 DIAGNOSIS — R404 Transient alteration of awareness: Secondary | ICD-10-CM | POA: Diagnosis not present

## 2016-10-22 DIAGNOSIS — F1011 Alcohol abuse, in remission: Secondary | ICD-10-CM

## 2016-10-22 DIAGNOSIS — R69 Illness, unspecified: Secondary | ICD-10-CM

## 2016-10-22 DIAGNOSIS — F1010 Alcohol abuse, uncomplicated: Secondary | ICD-10-CM | POA: Insufficient documentation

## 2016-10-22 DIAGNOSIS — Z951 Presence of aortocoronary bypass graft: Secondary | ICD-10-CM | POA: Insufficient documentation

## 2016-10-22 DIAGNOSIS — Y939 Activity, unspecified: Secondary | ICD-10-CM | POA: Diagnosis not present

## 2016-10-22 DIAGNOSIS — I10 Essential (primary) hypertension: Secondary | ICD-10-CM | POA: Diagnosis not present

## 2016-10-22 DIAGNOSIS — E1140 Type 2 diabetes mellitus with diabetic neuropathy, unspecified: Secondary | ICD-10-CM | POA: Diagnosis not present

## 2016-10-22 DIAGNOSIS — G4489 Other headache syndrome: Secondary | ICD-10-CM | POA: Diagnosis not present

## 2016-10-22 DIAGNOSIS — F17210 Nicotine dependence, cigarettes, uncomplicated: Secondary | ICD-10-CM | POA: Insufficient documentation

## 2016-10-22 DIAGNOSIS — Z9181 History of falling: Secondary | ICD-10-CM

## 2016-10-22 DIAGNOSIS — S3992XA Unspecified injury of lower back, initial encounter: Secondary | ICD-10-CM | POA: Diagnosis not present

## 2016-10-22 LAB — CBG MONITORING, ED: GLUCOSE-CAPILLARY: 287 mg/dL — AB (ref 65–99)

## 2016-10-22 MED ORDER — SODIUM CHLORIDE 0.9 % IV BOLUS (SEPSIS)
1000.0000 mL | Freq: Once | INTRAVENOUS | Status: AC
  Administered 2016-10-23: 1000 mL via INTRAVENOUS

## 2016-10-22 MED ORDER — CARVEDILOL 12.5 MG PO TABS
25.0000 mg | ORAL_TABLET | Freq: Once | ORAL | Status: AC
Start: 2016-10-22 — End: 2016-10-22
  Administered 2016-10-22: 25 mg via ORAL
  Filled 2016-10-22: qty 2

## 2016-10-22 MED ORDER — SODIUM CHLORIDE 0.9 % IV BOLUS (SEPSIS): 1000.0000 mL | Freq: Once | INTRAVENOUS | Status: DC

## 2016-10-22 MED ORDER — ONDANSETRON HCL 4 MG/2ML IJ SOLN: 4.0000 mg | Freq: Once | INTRAMUSCULAR | Status: DC

## 2016-10-22 MED ORDER — HYDRALAZINE HCL 25 MG PO TABS
25.0000 mg | ORAL_TABLET | Freq: Once | ORAL | Status: AC
  Administered 2016-10-22: 25 mg via ORAL
  Filled 2016-10-22: qty 1

## 2016-10-22 MED ORDER — ACETAMINOPHEN 325 MG PO TABS
650.0000 mg | ORAL_TABLET | Freq: Once | ORAL | Status: AC
  Administered 2016-10-22: 650 mg via ORAL
  Filled 2016-10-22: qty 2

## 2016-10-22 NOTE — ED Notes (Signed)
Ambulated pt he tolerated it well.

## 2016-10-22 NOTE — ED Provider Notes (Addendum)
[facility] DEPT Provider Note   CSN: [id number] Arrival date & time: 10/22/16  1213     History   Chief Complaint Chief Complaint  Patient presents with  . Headache  . Back Pain  . Numbness    HPI Jacob [name] is a 65 y.o. male.  Patient presents stating in general hasnt felt well in past week. Notes a fall a few days ago. Denies syncope. Was seen in ED each of the past 2 days for same.  Pt is very poor historian - level 5 caveat.  Pt indicates has had low back pain since fall - pt indicates he has had back pain on chronic basis, ?worse after fall. Denies radicular pain. No numbness/weakness. Denies any new or worsening headache. No neck pain. No chest pain or discomfort. No sob.  When in ED yesterday, etoh high. Pt indicates has had a glass or two of etoh today. Denies recurrent fall or new injury. Denies fever or chills.   The history is provided by the patient and the EMS personnel. The history is limited by the condition of the patient.  Headache   Pertinent negatives include no fever, no shortness of breath and no vomiting.  Back Pain   Associated symptoms include headaches. Pertinent negatives include no chest pain, no fever, no numbness, no abdominal pain, no dysuria and no weakness.    Past Medical History:  Diagnosis Date  . Arthritis   . Cataracts, bilateral    immature  . CHF (congestive heart failure) (HCC)    not on any meds  . Chronic back pain    stenosis  . Chronic kidney disease, unspecified   . Coronary atherosclerosis of artery bypass graft   . Coronary atherosclerosis of native coronary artery 2005   s/p surgery  . Drug abuse    hx; tested for cocaine as recently as 2/08. says he is not using drugs now - avoided defib. for this reason   . GERD (gastroesophageal reflux disease)    takes OTC meds as needed  . Glaucoma    uses eye drops daily  . Gout, unspecified    takes Allopurinol daily as well as Colchicine-if needed  . Hepatitis 1967   Hep C  . History of colon polyps    benign  . HTN (hypertension)    takes Coreg,Imdur.and Apresoline daily  . Human immunodeficiency virus (HIV) disease    takes Genvoya daily  . Hyperlipidemia    takes Atorvastatin daily  . Ischemic cardiomyopathy   . Muscle spasm    takes Zanaflex as needed  . Nocturia   . Peripheral neuropathy (HCC)    takes gabapentin daily  . Pneumonia    hx of  . Shortness of breath dyspnea    rarely but if notices it then with exertion  . Syphilis, unspecified   . Type II or unspecified type diabetes mellitus without mention of complication, not stated as uncontrolled 2004   Lantus daily.Average fasting blood sugar 125-199    Patient Active Problem List   Diagnosis Date Noted  . Protein-calorie malnutrition, severe 08/03/2016  . Severe sepsis ([place]) 08/02/2016  . Pneumonia 08/02/2016  . History of lumbar laminectomy for spinal cord decompression 02/29/2016  . Renal failure (ARF), acute on chronic (HCC) 11/03/2015  . Dehydration 11/03/2015  . Alcohol dependence ([place]) 11/03/2015  . Type 2 diabetes mellitus with vascular disease ([place]) 06/17/2015  . Acute renal failure ([place]) 04/26/2015  . HTN (hypertension) 04/26/2015  . Syncope 04/26/2015  .  DM (diabetes mellitus) ([place]) 04/26/2015  . Ischemic cardiomyopathy 05/12/2014  . Diarrhea 09/08/2013  . Shock ([place]) 09/06/2013  . Ulcer of lower extremity ([place]) 09/06/2013  . Community acquired pneumonia 09/06/2013  . Hyponatremia 09/06/2013  . Hyperkalemia 09/06/2013  . AKI (acute kidney injury) ([place]) 09/06/2013  . Hyperglycemia 09/06/2013  . Elevated transaminase level 09/06/2013  . Chest pain 09/06/2013  . Type II or unspecified type diabetes mellitus with unspecified complication, not stated as uncontrolled 10/22/2012  . Black stool 04/17/2012  . Bunion of left foot 11/25/2011  . Bunion, right foot 11/25/2011  . Polysubstance abuse 07/28/2011  . Hip fracture, left ([place]) 04/04/2011  . Closed fracture  of neck of femur ([place]) 04/04/2011  . Insomnia 02/18/2011  . CAD (coronary artery disease) 04/20/2009  . Chronic combined systolic and diastolic congestive heart failure ([place]) 04/20/2009  . HLD (hyperlipidemia) 11/20/2006  . Gout 11/20/2006  . TOBACCO ABUSE 11/20/2006  . Essential hypertension 11/20/2006  . Human immunodeficiency virus (HIV) disease ([place]) 09/02/2006  . SYPHILIS 09/02/2006    Past Surgical History:  Procedure Laterality Date  . COLONOSCOPY  2013   Dr.John Henrene Pastor   . CORONARY ARTERY BYPASS GRAFT    . laproscopic cholecystectomy  8/07  . left intertrochanteric hip fracture  2004   s/p intermedullary nail placement 2/08  . left transverse mandibular fracture  10/05  . LUMBAR LAMINECTOMY/DECOMPRESSION MICRODISCECTOMY N/A 02/29/2016   Procedure: Left L4-5 Lateral Recess Decompression, Removal Extradural Intraspinal Facet Cyst;  Surgeon: Marybelle Killings, MD;  Location: [facility];  Service: Orthopedics;  Laterality: N/A;       Home Medications    Prior to Admission medications   Medication Sig Start Date End Date Taking? Authorizing Provider  allopurinol (ZYLOPRIM) 100 MG tablet Take 1 tablet (100 mg total) by mouth daily. 07/15/16   Gildardo Cranker, DO  aspirin EC 81 MG tablet Take 81 mg by mouth daily.    Historical Provider, MD  atorvastatin (LIPITOR) 20 MG tablet Take 1 tablet (20 mg total) by mouth daily. 07/15/16   Gildardo Cranker, DO  B-D UF III MINI PEN NEEDLES 31G X 5 MM MISC USE 4 TIMES DAILY 05/24/16   Gildardo Cranker, DO  carvedilol (COREG) 25 MG tablet Take 1 tablet (25 mg total) by mouth 2 (two) times daily with a meal. 11/19/15   [name], MD  Choline Fenofibrate 135 MG capsule Take 1 capsule (135 mg total) by mouth daily. 07/15/16   Gildardo Cranker, DO  colchicine 0.6 MG tablet Take 0.6 mg by mouth daily as needed. Reported on 04/04/2016    Historical Provider, MD  elvitegravir-cobicistat-emtricitabine-tenofovir (GENVOYA) 150-150-200-10 MG TABS tablet TAKE 1 TABLET BY  MOUTH DAILY WITH BREAKFAST. 01/12/16   [name], MD  gabapentin (NEURONTIN) 300 MG capsule Take 1 capsule (300 mg total) by mouth 3 (three) times daily. 07/25/16   Gildardo Cranker, DO  Glucosamine-Chondroit-Vit C-Mn (GLUCOSAMINE CHONDR 1500 COMPLX PO) Take 2 tablets by mouth daily. Reported on 04/04/2016    Historical Provider, MD  hydrALAZINE (APRESOLINE) 25 MG tablet take 1 tablet by mouth three times a day 09/23/16   [name], MD  insulin glargine (LANTUS) 100 UNIT/ML injection Inject 60 Units into the skin at bedtime.    Historical Provider, MD  insulin lispro (HUMALOG KWIKPEN) 100 UNIT/ML KiwkPen Inject 0.09 mLs (9 Units total) into the skin 3 (three) times daily. Before Meals DX E11.22 08/17/16   Gildardo Cranker, DO  isosorbide mononitrate (IMDUR) 30 MG 24 hr  tablet Take 30 mg by mouth daily. Reported on 04/04/2016    Historical Provider, MD  latanoprost (XALATAN) 0.005 % ophthalmic solution Place 1 drop into both eyes at bedtime. Reported on 04/04/2016 04/23/12   Historical Provider, MD  Naftifine HCl 2 % CREA apply to affected area twice a day 09/23/16   Max T Hyatt, DPM  nitroGLYCERIN (NITROSTAT) 0.3 MG SL tablet Place 1 tablet (0.3 mg total) under the tongue as needed for chest pain (not to exceed 3 tablets in one day.). 02/24/16   [name], MD  omeprazole (PRILOSEC) 20 MG capsule Take 1 capsule (20 mg total) by mouth 2 (two) times daily before a meal. 09/14/16   Gildardo Cranker, DO  ONE TOUCH LANCETS MISC Check BS twice daily. 02/11/16   Gildardo Cranker, DO  ONE TOUCH ULTRA TEST test strip CHECK BLOOD SUGAR TWO TIMES DAILY AS DIRECTED 08/16/16   Gildardo Cranker, DO  traMADol (ULTRAM) 50 MG tablet Take 1 tablet (50 mg total) by mouth every 6 (six) hours as needed. 10/20/16   Tanna Furry, MD  umeclidinium-vilanterol (ANORO ELLIPTA) 62.5-25 MCG/INH AEPB Inhale 1 puff into the lungs daily. 07/15/16   Gildardo Cranker, DO    Family History Family History  Problem Relation Age of Onset  . Heart  failure Father   . Hypertension Father   . Diabetes Brother   . Heart attack Brother   . Alzheimer's disease Mother   . Stroke Sister   . Diabetes Sister   . Alzheimer's disease Sister   . Hypertension Brother   . Diabetes Brother   . Drug abuse Brother   . Colon cancer Neg Hx     Social History Social History  Substance Use Topics  . Smoking status: Current Every Day Smoker    Packs/day: 0.50    Years: 41.00    Types: Cigarettes  . Smokeless tobacco: Never Used  . Alcohol use 3.6 oz/week    6 Standard drinks or equivalent per week     Comment: occ.     Allergies   Amphetamines   Review of Systems Review of Systems  Constitutional: Negative for chills and fever.  HENT: Negative for sore throat.   Eyes: Negative for pain and visual disturbance.  Respiratory: Negative for cough and shortness of breath.   Cardiovascular: Negative for chest pain.  Gastrointestinal: Negative for abdominal pain, diarrhea and vomiting.  Genitourinary: Negative for dysuria.  Musculoskeletal: Positive for back pain. Negative for neck pain.  Skin: Negative for rash.  Neurological: Positive for headaches. Negative for weakness and numbness.  Hematological: Does not bruise/bleed easily.  Psychiatric/Behavioral: Negative for confusion.     Physical Exam Updated Vital Signs BP (!) 182/118 (BP Location: Left Arm)   Pulse 109   Temp 98.4 F (36.9 C) (Oral)   Resp 20   Ht 6\' 2"  (1.88 m)   Wt 86.2 kg   SpO2 99%   BMI 24.39 kg/m   Physical Exam  Constitutional: He is oriented to person, place, and time. He appears well-developed and well-nourished. No distress.  HENT:  Mouth/Throat: Oropharynx is clear and moist.  Healing stapled scalp wound, without sign of infection.   Eyes: Conjunctivae and EOM are normal. Pupils are equal, round, and reactive to light.  Neck: Normal range of motion. Neck supple. No tracheal deviation present.  Cardiovascular: Normal rate, regular rhythm, normal  heart sounds and intact distal pulses.   Pulmonary/Chest: Effort normal and breath sounds normal. No accessory muscle usage. No  respiratory distress.  Abdominal: Soft. Bowel sounds are normal. He exhibits no distension. There is no tenderness.  No abd wall contusion or bruising.   Genitourinary:  Genitourinary Comments: No cva tenderness  Musculoskeletal: He exhibits no edema.  Lumbar tenderness otherwise, CTLS spine, non tender, aligned, no step off. No focal bony tenderness on bil ext exam. Distal pulses palp bil.   Neurological: He is alert and oriented to person, place, and time.  Motor intact bil. stre 5/5. sens grossly intact. Ambulates w cane.   Skin: Skin is warm and dry. He is not diaphoretic.  Psychiatric: He has a normal mood and affect.  Nursing note and vitals reviewed.    ED Treatments / Results  Labs (all labs ordered are listed, but only abnormal results are displayed) Labs Reviewed - No data to display  EKG  EKG Interpretation None       Radiology Dg Lumbar Spine Complete  Result Date: 10/22/2016 CLINICAL DATA:  Fall 6 days ago.  Pain. EXAM: LUMBAR SPINE - COMPLETE 4+ VIEW COMPARISON:  None. FINDINGS: Degenerative disc disease at L5-S1 with disc space narrowing, spurring and vacuum disc. Degenerative facet disease throughout the lumbar spine. Normal alignment. No fracture. Diffuse aortic and iliac calcifications. No visible aneurysm. IMPRESSION: No acute bony abnormality. Aortoiliac atherosclerosis. Electronically Signed   By: Rolm Baptise M.D.   On: 10/22/2016 13:18   Ct Head Wo Contrast  Result Date: 10/21/2016 CLINICAL DATA:  Fall.  History of HIV, diabetes, hypertension. EXAM: CT HEAD WITHOUT CONTRAST TECHNIQUE: Contiguous axial images were obtained from the base of the skull through the vertex without intravenous contrast. COMPARISON:  CT HEAD October 20, 2016 FINDINGS: BRAIN: The ventricles and sulci are normal for age. No intraparenchymal hemorrhage,  mass effect nor midline shift. Patchy supratentorial white matter hypodensities less than expected for patient's age, though non-specific are most compatible with chronic small vessel ischemic disease. No acute large vascular territory infarcts. No abnormal extra-axial fluid collections. Basal cisterns are patent. VASCULAR: Moderate calcific atherosclerosis of the carotid siphons. SKULL: No skull fracture. Small RIGHT frontal and RIGHT occipital scalp hematomas with RIGHT occipital scalp skin staples. SINUSES/ORBITS: The mastoid air-cells and included paranasal sinuses are well-aerated.The included ocular globes and orbital contents are non-suspicious. OTHER: None. IMPRESSION: No acute intracranial process ; negative CT HEAD for age. Electronically Signed   By: Elon Alas M.D.   On: 10/21/2016 23:54    Procedures Procedures (including critical care time)  Medications Ordered in ED Medications  acetaminophen (TYLENOL) tablet 650 mg (not administered)     Initial Impression / Assessment and Plan / ED Course  I have reviewed the triage vital signs and the nursing notes.  Pertinent labs & imaging results that were available during my care of the patient were reviewed by me and considered in my medical decision making (see chart for details).  Clinical Course    Reviewed nursing notes and prior charts for additional history.   Tylenol po. Po fluids. Xrays.  Pt with two head cts in past 2 days, neg for acute head/brain injury.   Labs yesterday reviewed.   Patient ambulatory in ED.   BP high.  Pt indicates hasnt taken his medication today.    Dose of home meds ordered in ED.   No headache. No cp or sob.   Patient currently appears stable for d/c.  rec close pcp f/u.     Final Clinical Impressions(s) / ED Diagnoses   Final diagnoses:  None  New Prescriptions New Prescriptions   No medications on file        Lajean Saver, MD 10/22/16 [phone]

## 2016-10-22 NOTE — ED Triage Notes (Signed)
Pt presents to the ED by [facility] for generalized weakness. Pt was seen at the ED earlier today 12/30. Once he got home he started vomiting. Pt states he has vomited x 3 since 8 pm. Pt is hypertensive and hyperglycemic but noncompliant with medications.

## 2016-10-22 NOTE — ED Provider Notes (Signed)
[facility] DEPT Provider Note   CSN: [id number] Arrival date & time: 10/22/16  2346  By signing my name below, I, Jeanell Sparrow, attest that this documentation has been prepared under the direction and in the presence of Deno Etienne, DO . Electronically Signed: Jeanell Sparrow, Scribe. 10/22/2016. 12:00 AM.  History   Chief Complaint Chief Complaint  Patient presents with  . Weakness   The history is provided by the patient. No language interpreter was used.  Weakness  Primary symptoms include no focal weakness. This is a new problem. The current episode started 3 to 5 hours ago. The problem has not changed since onset.There was no focality noted. There has been no fever. Associated symptoms include vomiting (Post-tussive). Pertinent negatives include no shortness of breath, no chest pain, no confusion and no headaches. Associated medical issues do not include trauma.   HPI Comments: Jacob [name] is a 65 y.o. male who presents to the Emergency Department complaining of constant moderate generalized weakness that started a few hours ago. He states he started feeling weak at home following his ED visit on 10/22/16. He reports no modifying factors. He reports associated symptoms of cough, vomiting (3 episodes), and lower back pain. He describes the emesis as green. He denies any hematemesis, abdominal pain, or other complaints.     PCP: Gildardo Cranker, DO  Past Medical History:  Diagnosis Date  . Arthritis   . Cataracts, bilateral    immature  . CHF (congestive heart failure) (HCC)    not on any meds  . Chronic back pain    stenosis  . Chronic kidney disease, unspecified   . Coronary atherosclerosis of artery bypass graft   . Coronary atherosclerosis of native coronary artery 2005   s/p surgery  . Drug abuse    hx; tested for cocaine as recently as 2/08. says he is not using drugs now - avoided defib. for this reason   . GERD (gastroesophageal reflux disease)    takes OTC  meds as needed  . Glaucoma    uses eye drops daily  . Gout, unspecified    takes Allopurinol daily as well as Colchicine-if needed  . Hepatitis 1967   Hep C  . History of colon polyps    benign  . HTN (hypertension)    takes Coreg,Imdur.and Apresoline daily  . Human immunodeficiency virus (HIV) disease    takes Genvoya daily  . Hyperlipidemia    takes Atorvastatin daily  . Ischemic cardiomyopathy   . Muscle spasm    takes Zanaflex as needed  . Nocturia   . Peripheral neuropathy (HCC)    takes gabapentin daily  . Pneumonia    hx of  . Shortness of breath dyspnea    rarely but if notices it then with exertion  . Syphilis, unspecified   . Type II or unspecified type diabetes mellitus without mention of complication, not stated as uncontrolled 2004   Lantus daily.Average fasting blood sugar 125-199    Patient Active Problem List   Diagnosis Date Noted  . Protein-calorie malnutrition, severe 08/03/2016  . Severe sepsis ([place]) 08/02/2016  . Pneumonia 08/02/2016  . History of lumbar laminectomy for spinal cord decompression 02/29/2016  . Renal failure (ARF), acute on chronic (HCC) 11/03/2015  . Dehydration 11/03/2015  . Alcohol dependence ([place]) 11/03/2015  . Type 2 diabetes mellitus with vascular disease ([place]) 06/17/2015  . Acute renal failure ([place]) 04/26/2015  . HTN (hypertension) 04/26/2015  . Syncope 04/26/2015  . DM (diabetes mellitus) (  [place]) 04/26/2015  . Ischemic cardiomyopathy 05/12/2014  . Diarrhea 09/08/2013  . Shock ([place]) 09/06/2013  . Ulcer of lower extremity ([place]) 09/06/2013  . Community acquired pneumonia 09/06/2013  . Hyponatremia 09/06/2013  . Hyperkalemia 09/06/2013  . AKI (acute kidney injury) ([place]) 09/06/2013  . Hyperglycemia 09/06/2013  . Elevated transaminase level 09/06/2013  . Chest pain 09/06/2013  . Type II or unspecified type diabetes mellitus with unspecified complication, not stated as uncontrolled 10/22/2012  . Black stool 04/17/2012  .  Bunion of left foot 11/25/2011  . Bunion, right foot 11/25/2011  . Polysubstance abuse 07/28/2011  . Hip fracture, left ([place]) 04/04/2011  . Closed fracture of neck of femur ([place]) 04/04/2011  . Insomnia 02/18/2011  . CAD (coronary artery disease) 04/20/2009  . Chronic combined systolic and diastolic congestive heart failure ([place]) 04/20/2009  . HLD (hyperlipidemia) 11/20/2006  . Gout 11/20/2006  . TOBACCO ABUSE 11/20/2006  . Essential hypertension 11/20/2006  . Human immunodeficiency virus (HIV) disease ([place]) 09/02/2006  . SYPHILIS 09/02/2006    Past Surgical History:  Procedure Laterality Date  . COLONOSCOPY  2013   Dr.John Henrene Pastor   . CORONARY ARTERY BYPASS GRAFT    . laproscopic cholecystectomy  8/07  . left intertrochanteric hip fracture  2004   s/p intermedullary nail placement 2/08  . left transverse mandibular fracture  10/05  . LUMBAR LAMINECTOMY/DECOMPRESSION MICRODISCECTOMY N/A 02/29/2016   Procedure: Left L4-5 Lateral Recess Decompression, Removal Extradural Intraspinal Facet Cyst;  Surgeon: Marybelle Killings, MD;  Location: [facility];  Service: Orthopedics;  Laterality: N/A;       Home Medications    Prior to Admission medications   Medication Sig Start Date End Date Taking? Authorizing Provider  allopurinol (ZYLOPRIM) 100 MG tablet Take 1 tablet (100 mg total) by mouth daily. 07/15/16  Yes Gildardo Cranker, DO  aspirin EC 81 MG tablet Take 81 mg by mouth daily.   Yes Historical Provider, MD  atorvastatin (LIPITOR) 20 MG tablet Take 1 tablet (20 mg total) by mouth daily. 07/15/16  Yes Gildardo Cranker, DO  carvedilol (COREG) 25 MG tablet Take 1 tablet (25 mg total) by mouth 2 (two) times daily with a meal. 11/19/15  Yes [name], MD  Choline Fenofibrate 135 MG capsule Take 1 capsule (135 mg total) by mouth daily. 07/15/16  Yes Gildardo Cranker, DO  colchicine 0.6 MG tablet Take 0.6 mg by mouth daily as needed. Reported on 04/04/2016   Yes Historical Provider, MD    elvitegravir-cobicistat-emtricitabine-tenofovir (GENVOYA) 150-150-200-10 MG TABS tablet TAKE 1 TABLET BY MOUTH DAILY WITH BREAKFAST. 01/12/16  Yes [name], MD  gabapentin (NEURONTIN) 300 MG capsule Take 1 capsule (300 mg total) by mouth 3 (three) times daily. 07/25/16  Yes Gildardo Cranker, DO  Glucosamine-Chondroit-Vit C-Mn (GLUCOSAMINE CHONDR 1500 COMPLX PO) Take 2 tablets by mouth daily. Reported on 04/04/2016   Yes Historical Provider, MD  hydrALAZINE (APRESOLINE) 25 MG tablet take 1 tablet by mouth three times a day 09/23/16  Yes [name], MD  insulin glargine (LANTUS) 100 UNIT/ML injection Inject 60 Units into the skin at bedtime.   Yes Historical Provider, MD  insulin lispro (HUMALOG KWIKPEN) 100 UNIT/ML KiwkPen Inject 0.09 mLs (9 Units total) into the skin 3 (three) times daily. Before Meals DX E11.22 08/17/16  Yes Gildardo Cranker, DO  isosorbide mononitrate (IMDUR) 30 MG 24 hr tablet Take 30 mg by mouth daily. Reported on 04/04/2016   Yes Historical Provider, MD  latanoprost (XALATAN) 0.005 % ophthalmic solution Place  1 drop into both eyes at bedtime. Reported on 04/04/2016 04/23/12  Yes Historical Provider, MD  Naftifine HCl 2 % CREA apply to affected area twice a day 09/23/16  Yes Max T Hyatt, DPM  nitroGLYCERIN (NITROSTAT) 0.3 MG SL tablet Place 1 tablet (0.3 mg total) under the tongue as needed for chest pain (not to exceed 3 tablets in one day.). 02/24/16  Yes [name], MD  umeclidinium-vilanterol [facility] ELLIPTA) 62.5-25 MCG/INH AEPB Inhale 1 puff into the lungs daily. 07/15/16  Yes Gildardo Cranker, DO  B-D UF III MINI PEN NEEDLES 31G X 5 MM MISC USE 4 TIMES DAILY 05/24/16   Gildardo Cranker, DO  omeprazole (PRILOSEC) 20 MG capsule Take 1 capsule (20 mg total) by mouth 2 (two) times daily before a meal. 09/14/16   Gildardo Cranker, DO  ondansetron (ZOFRAN ODT) 4 MG disintegrating tablet Take 1 tablet (4 mg total) by mouth every 8 (eight) hours as needed for nausea or vomiting. 10/23/16   Deno Etienne, DO  ONE TOUCH LANCETS MISC Check BS twice daily. 02/11/16   Gildardo Cranker, DO  ONE TOUCH ULTRA TEST test strip CHECK BLOOD SUGAR TWO TIMES DAILY AS DIRECTED 08/16/16   Gildardo Cranker, DO  traMADol (ULTRAM) 50 MG tablet Take 1 tablet (50 mg total) by mouth every 6 (six) hours as needed. 10/20/16   Tanna Furry, MD    Family History Family History  Problem Relation Age of Onset  . Heart failure Father   . Hypertension Father   . Diabetes Brother   . Heart attack Brother   . Alzheimer's disease Mother   . Stroke Sister   . Diabetes Sister   . Alzheimer's disease Sister   . Hypertension Brother   . Diabetes Brother   . Drug abuse Brother   . Colon cancer Neg Hx     Social History Social History  Substance Use Topics  . Smoking status: Current Every Day Smoker    Packs/day: 0.50    Years: 41.00    Types: Cigarettes  . Smokeless tobacco: Never Used  . Alcohol use 3.6 oz/week    6 Standard drinks or equivalent per week     Comment: occ.     Allergies   Amphetamines   Review of Systems Review of Systems  Constitutional: Negative for chills and fever.  HENT: Negative for congestion and facial swelling.   Eyes: Negative for discharge and visual disturbance.  Respiratory: Positive for cough. Negative for shortness of breath.   Cardiovascular: Negative for chest pain and palpitations.  Gastrointestinal: Positive for vomiting (Post-tussive). Negative for abdominal pain and diarrhea.  Musculoskeletal: Positive for back pain (Lower). Negative for arthralgias and myalgias.  Skin: Negative for color change and rash.  Neurological: Positive for weakness (Generalized). Negative for tremors, focal weakness, syncope and headaches.  Psychiatric/Behavioral: Negative for confusion and dysphoric mood.     Physical Exam Updated Vital Signs BP 176/86   Pulse (!) 58   Temp 97.3 F (36.3 C) (Oral)   Resp 26   Ht 6\' 2"  (1.88 m)   Wt 190 lb (86.2 kg)   SpO2 95%   BMI 24.39 kg/m    Physical Exam  Constitutional: He is oriented to person, place, and time. He appears well-developed and well-nourished.  HENT:  Head: Normocephalic and atraumatic.  Swollen turbinates. Postnasal drip. TMs are normal. No facial TTP.   Eyes: EOM are normal. Pupils are equal, round, and reactive to light.  Neck: Normal range of motion.  Neck supple. No JVD present.  Cardiovascular: Normal rate and regular rhythm.  Exam reveals no gallop and no friction rub.   No murmur heard. Pulmonary/Chest: No respiratory distress. He has no wheezes.  Abdominal: He exhibits no distension. There is no rebound and no guarding.  Musculoskeletal: Normal range of motion.  Neurological: He is alert and oriented to person, place, and time.  Skin: No rash noted. No pallor.  Psychiatric: He has a normal mood and affect. His behavior is normal.  Nursing note and vitals reviewed.    ED Treatments / Results  DIAGNOSTIC STUDIES: Oxygen Saturation is 100% on RA, normal by my interpretation.    COORDINATION OF CARE: 12:04 AM- Pt advised of plan for treatment and pt agrees.  Labs (all labs ordered are listed, but only abnormal results are displayed) Labs Reviewed  COMPREHENSIVE METABOLIC PANEL - Abnormal; Notable for the following:       Result Value   Chloride 100 (*)    Glucose, Bld 285 (*)    Creatinine, Ser 1.71 (*)    Albumin 3.4 (*)    AST 53 (*)    GFR calc non Af Amer 40 (*)    GFR calc Af Amer 46 (*)    All other components within normal limits  LIPASE, BLOOD - Abnormal; Notable for the following:    Lipase 52 (*)    All other components within normal limits  CBG MONITORING, ED - Abnormal; Notable for the following:    Glucose-Capillary 287 (*)    All other components within normal limits  CBC WITH DIFFERENTIAL/PLATELET    EKG  EKG Interpretation None       Radiology Dg Lumbar Spine Complete  Result Date: 10/22/2016 CLINICAL DATA:  Fall 6 days ago.  Pain. EXAM: LUMBAR SPINE -  COMPLETE 4+ VIEW COMPARISON:  None. FINDINGS: Degenerative disc disease at L5-S1 with disc space narrowing, spurring and vacuum disc. Degenerative facet disease throughout the lumbar spine. Normal alignment. No fracture. Diffuse aortic and iliac calcifications. No visible aneurysm. IMPRESSION: No acute bony abnormality. Aortoiliac atherosclerosis. Electronically Signed   By: Rolm Baptise M.D.   On: 10/22/2016 13:18   Ct Head Wo Contrast  Result Date: 10/21/2016 CLINICAL DATA:  Fall.  History of HIV, diabetes, hypertension. EXAM: CT HEAD WITHOUT CONTRAST TECHNIQUE: Contiguous axial images were obtained from the base of the skull through the vertex without intravenous contrast. COMPARISON:  CT HEAD October 20, 2016 FINDINGS: BRAIN: The ventricles and sulci are normal for age. No intraparenchymal hemorrhage, mass effect nor midline shift. Patchy supratentorial white matter hypodensities less than expected for patient's age, though non-specific are most compatible with chronic small vessel ischemic disease. No acute large vascular territory infarcts. No abnormal extra-axial fluid collections. Basal cisterns are patent. VASCULAR: Moderate calcific atherosclerosis of the carotid siphons. SKULL: No skull fracture. Small RIGHT frontal and RIGHT occipital scalp hematomas with RIGHT occipital scalp skin staples. SINUSES/ORBITS: The mastoid air-cells and included paranasal sinuses are well-aerated.The included ocular globes and orbital contents are non-suspicious. OTHER: None. IMPRESSION: No acute intracranial process ; negative CT HEAD for age. Electronically Signed   By: Elon Alas M.D.   On: 10/21/2016 23:54    Procedures Procedures (including critical care time)  Medications Ordered in ED Medications  ondansetron (ZOFRAN) injection 4 mg (0 mg Intravenous Hold 10/23/16 0104)  carvedilol (COREG) tablet 25 mg (not administered)  hydrALAZINE (APRESOLINE) tablet 25 mg (not administered)  sodium chloride  0.9 % bolus 1,000 mL (1,000 mLs  Intravenous New Bag/Given 10/23/16 0000)     Initial Impression / Assessment and Plan / ED Course  I have reviewed the triage vital signs and the nursing notes.  Pertinent labs & imaging results that were available during my care of the patient were reviewed by me and considered in my medical decision making (see chart for details).  Clinical Course     65 yo M With a chief complaint of nausea and vomiting. This been going on since afternoon. Patient has been to the ED 4 times in the past 3 days including earlier today. He has no abdominal pain. Has some new congestion compared to yesterday. I suspect that he has a viral illness. He was given IV fluids and Zofran  With some improvement.  Patient labs are reassuring. Able to tolerate by mouth in the ED. Discharge home.  1:28 AM:  I have discussed the diagnosis/risks/treatment options with the patient and believe the pt to be eligible for discharge home to follow-up with PCP. We also discussed returning to the ED immediately if new or worsening sx occur. We discussed the sx which are most concerning (e.g., sudden worsening pain, fever, inability to tolerate by mouth) that necessitate immediate return. Medications administered to the patient during their visit and any new prescriptions provided to the patient are listed below.  Medications given during this visit Medications  ondansetron (ZOFRAN) injection 4 mg (0 mg Intravenous Hold 10/23/16 0104)  carvedilol (COREG) tablet 25 mg (not administered)  hydrALAZINE (APRESOLINE) tablet 25 mg (not administered)  sodium chloride 0.9 % bolus 1,000 mL (1,000 mLs Intravenous New Bag/Given 10/23/16 0000)     The patient appears reasonably screen and/or stabilized for discharge and I doubt any other medical condition or other [facility] requiring further screening, evaluation, or treatment in the ED at this time prior to discharge.    Final Clinical Impressions(s) / ED  Diagnoses   Final diagnoses:  Influenza-like illness    New Prescriptions New Prescriptions   ONDANSETRON (ZOFRAN ODT) 4 MG DISINTEGRATING TABLET    Take 1 tablet (4 mg total) by mouth every 8 (eight) hours as needed for nausea or vomiting.   I personally performed the services described in this documentation, which was scribed in my presence. The recorded information has been reviewed and is accurate.      Deno Etienne, DO 10/23/16 [phone]

## 2016-10-22 NOTE — Discharge Instructions (Signed)
It was our pleasure to provide your ER care today - we hope that you feel better.  Rest. Drink plenty of fluids.   Avoid any alcohol use.  Follow up with AA, and use resource guide provided for additional community resources.  Take your medications as prescribed.   Follow up with primary care doctor in the next few days. Also have your blood pressure rechecked then, as it is high today.   Return to ER if worse, new symptoms, fevers, severe pain, other concern.

## 2016-10-22 NOTE — Discharge Instructions (Signed)
Take 4 over the counter ibuprofen tablets 3 times a day or 2 over-the-counter naproxen tablets twice a day for pain. Also take tylenol 1000mg(2 extra strength) four times a day.    

## 2016-10-22 NOTE — ED Triage Notes (Signed)
Pt c/o headache, back pain and numbness to B/L feet. Pt fell the other day and was seen here for same.

## 2016-10-23 LAB — COMPREHENSIVE METABOLIC PANEL
ALT: 39 U/L (ref 17–63)
AST: 53 U/L — AB (ref 15–41)
Albumin: 3.4 g/dL — ABNORMAL LOW (ref 3.5–5.0)
Alkaline Phosphatase: 80 U/L (ref 38–126)
Anion gap: 12 (ref 5–15)
BILIRUBIN TOTAL: 0.7 mg/dL (ref 0.3–1.2)
BUN: 19 mg/dL (ref 6–20)
CO2: 23 mmol/L (ref 22–32)
CREATININE: 1.71 mg/dL — AB (ref 0.61–1.24)
Calcium: 8.9 mg/dL (ref 8.9–10.3)
Chloride: 100 mmol/L — ABNORMAL LOW (ref 101–111)
GFR, EST AFRICAN AMERICAN: 46 mL/min — AB (ref >60)
GFR, EST NON AFRICAN AMERICAN: 40 mL/min — AB (ref >60)
Glucose, Bld: 285 mg/dL — ABNORMAL HIGH (ref 65–99)
POTASSIUM: 4.2 mmol/L (ref 3.5–5.1)
Sodium: 135 mmol/L (ref 135–145)
TOTAL PROTEIN: 7.9 g/dL (ref 6.5–8.1)

## 2016-10-23 LAB — CBC WITH DIFFERENTIAL/PLATELET
BASOS PCT: 1 %
Basophils Absolute: 0.1 10*3/uL (ref 0.0–0.1)
EOS ABS: 0.1 10*3/uL (ref 0.0–0.7)
EOS PCT: 1 %
HCT: 40.3 % (ref 39.0–52.0)
HEMOGLOBIN: 13.6 g/dL (ref 13.0–17.0)
Lymphocytes Relative: 25 %
Lymphs Abs: 1.9 10*3/uL (ref 0.7–4.0)
MCH: 29.8 pg (ref 26.0–34.0)
MCHC: 33.7 g/dL (ref 30.0–36.0)
MCV: 88.2 fL (ref 78.0–100.0)
Monocytes Absolute: 0.6 10*3/uL (ref 0.1–1.0)
Monocytes Relative: 8 %
NEUTROS PCT: 65 %
Neutro Abs: 4.8 10*3/uL (ref 1.7–7.7)
PLATELETS: 174 10*3/uL (ref 150–400)
RBC: 4.57 MIL/uL (ref 4.22–5.81)
RDW: 13.3 % (ref 11.5–15.5)
WBC: 7.4 10*3/uL (ref 4.0–10.5)

## 2016-10-23 LAB — LIPASE, BLOOD: LIPASE: 52 U/L — AB (ref 11–51)

## 2016-10-23 MED ORDER — HYDRALAZINE HCL 25 MG PO TABS
25.0000 mg | ORAL_TABLET | Freq: Once | ORAL | Status: AC
  Administered 2016-10-23: 25 mg via ORAL
  Filled 2016-10-23: qty 1

## 2016-10-23 MED ORDER — ONDANSETRON 4 MG PO TBDP: 4.0000 mg | ORAL_TABLET | Freq: Three times a day (TID) | ORAL | 0 refills | Status: DC | PRN

## 2016-10-23 MED ORDER — CARVEDILOL 12.5 MG PO TABS
25.0000 mg | ORAL_TABLET | Freq: Once | ORAL | Status: AC
  Administered 2016-10-23: 25 mg via ORAL
  Filled 2016-10-23: qty 2

## 2016-10-23 NOTE — Discharge Instructions (Signed)
Take tylenol 2 pills 4 times a day and motrin 4 pills 3 times a day.  Drink plenty of fluids.  Return for worsening shortness of breath, headache, confusion. Follow up with your family doctor.   

## 2016-10-25 ENCOUNTER — Ambulatory Visit (INDEPENDENT_AMBULATORY_CARE_PROVIDER_SITE_OTHER): Payer: Medicare Other | Admitting: Podiatry

## 2016-10-25 ENCOUNTER — Other Ambulatory Visit: Payer: Self-pay | Admitting: Internal Medicine

## 2016-10-25 DIAGNOSIS — B351 Tinea unguium: Secondary | ICD-10-CM

## 2016-10-25 DIAGNOSIS — E1142 Type 2 diabetes mellitus with diabetic polyneuropathy: Secondary | ICD-10-CM | POA: Diagnosis not present

## 2016-10-25 DIAGNOSIS — S90422A Blister (nonthermal), left great toe, initial encounter: Secondary | ICD-10-CM

## 2016-10-25 DIAGNOSIS — Q828 Other specified congenital malformations of skin: Secondary | ICD-10-CM

## 2016-10-25 DIAGNOSIS — M79676 Pain in unspecified toe(s): Secondary | ICD-10-CM

## 2016-10-25 MED ORDER — CEPHALEXIN 500 MG PO CAPS: 500.0000 mg | ORAL_CAPSULE | Freq: Two times a day (BID) | ORAL | 0 refills | Status: DC

## 2016-10-25 NOTE — Progress Notes (Signed)
[name], male   DOB: 1951-06-30, 66 y.o.   MRN: [id number] Complaint:  Visit Type: Patient returns to my office for continued preventative foot care services. Complaint: Patient states" my nails have grown long and thick and become painful to walk and wear shoes" .  He says he has developed significantly thick callus on the left big toe, which was treated previously by Dr. Amalia Hailey as a blister. He says the callus on his right foot are extremely painful as he walks wearing his shoes. Finally, he gives a history of having fallen and glass breaking which caused a  cut on the bunion, right foot. He presents the office today for an evaluation and treatment of this condition  Podiatric Exam: Vascular: dorsalis pedis and posterior tibial pulses are palpable bilateral. Capillary return is immediate. Temperature gradient is WNL. Skin turgor WNL  Sensorium: Diminished  Semmes Weinstein monofilament test. Normal tactile sensation bilaterally. Nail Exam: Pt has thick disfigured discolored nails with subungual debris noted bilateral entire nail hallux through fifth toenails Ulcer Exam: There is a small ulcer noted on plantarmedial aspect left hallux.  This was covered by thick blackened callus.  No redness or swelling noted.  Clear drainage noted. Orthopedic Exam: Muscle tone and strength are WNL. No limitations in general ROM. No crepitus or effusions noted. Foot type and digits show no abnormalities. Severe HAV  B/L. Skin: .  Porokeratosis sub 1,4,5 and sub 5th metabase right foot.   Callus sub 5th B/L Callus.  Pinch callus B/L.  Ragged laceration noted dorsomedial exostosis site right foot.  No evidence of redness or infection.  Diagnosis:  Onychomycosis, , Pain in right toe, pain in left toes ,Porokeratosos B/L  Treatment & Plan Procedures and Treatment: Consent by patient was obtained for treatment procedures. The patient understood the discussion of treatment and procedures well. All  questions were answered thoroughly reviewed. Debridement of mycotic and hypertrophic toenails, 1 through 5 bilateral and clearing of subungual debris. No ulceration, no infection noted. D.ebride callus B/L.  Debridement of left hallux reveals smill draining ulcer.  This was bandaged with neosporin/DSD.  Home instructions given. His ragged laceration right foot was bandaged with silvadene and DSD.   Return Visit-Office Procedure: Patient instructed to return to the office for a follow up visit 10 weeks for continued evaluation and treatment.    Gardiner Barefoot DPM

## 2016-10-31 ENCOUNTER — Encounter: Payer: Self-pay | Admitting: Nurse Practitioner

## 2016-10-31 ENCOUNTER — Ambulatory Visit (INDEPENDENT_AMBULATORY_CARE_PROVIDER_SITE_OTHER): Payer: Medicare Other | Admitting: Nurse Practitioner

## 2016-10-31 VITALS — BP 142/78 | HR 82 | Temp 98.3°F | Resp 18 | Ht 74.0 in | Wt 190.6 lb

## 2016-10-31 DIAGNOSIS — E1159 Type 2 diabetes mellitus with other circulatory complications: Secondary | ICD-10-CM

## 2016-10-31 DIAGNOSIS — T07XXXA Unspecified multiple injuries, initial encounter: Secondary | ICD-10-CM

## 2016-10-31 DIAGNOSIS — W1800XA Striking against unspecified object with subsequent fall, initial encounter: Secondary | ICD-10-CM | POA: Diagnosis not present

## 2016-10-31 DIAGNOSIS — Z4802 Encounter for removal of sutures: Secondary | ICD-10-CM

## 2016-10-31 NOTE — Progress Notes (Signed)
Careteam: Patient Care Team: Gildardo Cranker, DO as PCP - General (Internal Medicine) [name], MD as PCP - Infectious Diseases (Infectious Diseases) [name], MD as Consulting Physician (Ophthalmology) [name], MD as Consulting Physician (Cardiology) [name], MD as Consulting Physician (Infectious Diseases) Gardiner Barefoot, DPM as Consulting Physician (Podiatry)  Advanced Directive information Does Patient Have a Medical Advance Directive?: Yes, Type of Advance Directive: [place];Living will  Allergies  Allergen Reactions  . Amphetamines Other (See Comments)    Pt is unaware of this -     Chief Complaint  Patient presents with  . Follow-up     HPI: Patient is a 66 y.o. male seen in the office today for removal of sutures placed on 12/28 to the right occipital scalp. The patient had a fall at home into the glass table that was holding his Tv. The patient reports he had been partying and drinking over the Christmas holiday, feels like his blood pressure rose and then he got up at some point during the night, which is when he fell. He sustained multiple abrasions to BLE and back, and lacerations to the right foot and left calf. His podiatrist saw the right foot and instructed the patient to apply neosporin and keep it covered with a Band-Aid. The patient reports the one on his right calf has not been assessed.  The patient has been taking Keflex since 1/2 as prescribed by his podiatrist Dr. Prudence Davidson after debridement of the left toenails and hallux that revealed a small draining ulcer.   Patient reports the left sided back pain is getting better, he just feels like it is bruised. He is taking Tylenol 1,000 mg 2-3 times per day, which is effective in relieving the pain.   No further headache, weakness or vomiting reported.   After showering each morning the patient changes his Band-Aids and applies Neosporin. He applied Vaseline all over his  body when he gets out of the shower, including covering the wounds.   Review of Systems:  Review of Systems  Respiratory: Negative for shortness of breath.   Cardiovascular: Negative for chest pain and palpitations.  Musculoskeletal: Positive for back pain (left side of low back).  Skin: Positive for wound (numerous due to recent fall and podiatrist debridement).  Neurological: Negative for dizziness, weakness, light-headedness and headaches.    Past Medical History:  Diagnosis Date  . Arthritis   . Cataracts, bilateral    immature  . CHF (congestive heart failure) (HCC)    not on any meds  . Chronic back pain    stenosis  . Chronic kidney disease, unspecified   . Coronary atherosclerosis of artery bypass graft   . Coronary atherosclerosis of native coronary artery 2005   s/p surgery  . Drug abuse    hx; tested for cocaine as recently as 2/08. says he is not using drugs now - avoided defib. for this reason   . GERD (gastroesophageal reflux disease)    takes OTC meds as needed  . Glaucoma    uses eye drops daily  . Gout, unspecified    takes Allopurinol daily as well as Colchicine-if needed  . Hepatitis 1967   Hep C  . History of colon polyps    benign  . HTN (hypertension)    takes Coreg,Imdur.and Apresoline daily  . Human immunodeficiency virus (HIV) disease    takes Genvoya daily  . Hyperlipidemia    takes Atorvastatin daily  . Ischemic cardiomyopathy   .  Muscle spasm    takes Zanaflex as needed  . Nocturia   . Peripheral neuropathy (HCC)    takes gabapentin daily  . Pneumonia    hx of  . Shortness of breath dyspnea    rarely but if notices it then with exertion  . Syphilis, unspecified   . Type II or unspecified type diabetes mellitus without mention of complication, not stated as uncontrolled 2004   Lantus daily.Average fasting blood sugar 125-199   Past Surgical History:  Procedure Laterality Date  . COLONOSCOPY  2013   Dr.John Henrene Pastor   . CORONARY  ARTERY BYPASS GRAFT    . laproscopic cholecystectomy  8/07  . left intertrochanteric hip fracture  2004   s/p intermedullary nail placement 2/08  . left transverse mandibular fracture  10/05  . LUMBAR LAMINECTOMY/DECOMPRESSION MICRODISCECTOMY N/A 02/29/2016   Procedure: Left L4-5 Lateral Recess Decompression, Removal Extradural Intraspinal Facet Cyst;  Surgeon: Marybelle Killings, MD;  Location: [facility];  Service: Orthopedics;  Laterality: N/A;   Social History:   reports that he has been smoking Cigarettes.  He has a 20.50 pack-year smoking history. He has never used smokeless tobacco. He reports that he drinks about 3.6 oz of alcohol per week . He reports that he does not use drugs.  Family History  Problem Relation Age of Onset  . Heart failure Father   . Hypertension Father   . Diabetes Brother   . Heart attack Brother   . Alzheimer's disease Mother   . Stroke Sister   . Diabetes Sister   . Alzheimer's disease Sister   . Hypertension Brother   . Diabetes Brother   . Drug abuse Brother   . Colon cancer Neg Hx     Medications: Patient's Medications  New Prescriptions   No medications on file  Previous Medications   ALLOPURINOL (ZYLOPRIM) 100 MG TABLET    Take 1 tablet (100 mg total) by mouth daily.   ASPIRIN EC 81 MG TABLET    Take 81 mg by mouth daily.   ATORVASTATIN (LIPITOR) 20 MG TABLET    Take 1 tablet (20 mg total) by mouth daily.   B-D UF III MINI PEN NEEDLES 31G X 5 MM MISC    USE 4 TIMES DAILY   CARVEDILOL (COREG) 25 MG TABLET    Take 1 tablet (25 mg total) by mouth 2 (two) times daily with a meal.   CEPHALEXIN (KEFLEX) 500 MG CAPSULE    Take 1 capsule (500 mg total) by mouth 2 (two) times daily.   CHOLINE FENOFIBRATE 135 MG CAPSULE    Take 1 capsule (135 mg total) by mouth daily.   COLCHICINE 0.6 MG TABLET    Take 0.6 mg by mouth daily as needed. Reported on 04/04/2016   ELVITEGRAVIR-COBICISTAT-EMTRICITABINE-TENOFOVIR (GENVOYA) 150-150-200-10 MG TABS TABLET    TAKE 1 TABLET  BY MOUTH DAILY WITH BREAKFAST.   GABAPENTIN (NEURONTIN) 300 MG CAPSULE    take 1 capsule by mouth three times a day   GLUCOSAMINE-CHONDROIT-VIT C-MN (GLUCOSAMINE CHONDR 1500 COMPLX PO)    Take 2 tablets by mouth daily. Reported on 04/04/2016   HYDRALAZINE (APRESOLINE) 25 MG TABLET    take 1 tablet by mouth three times a day   INSULIN LISPRO (HUMALOG KWIKPEN) 100 UNIT/ML KIWKPEN    Inject 0.09 mLs (9 Units total) into the skin 3 (three) times daily. Before Meals DX E11.22   ISOSORBIDE MONONITRATE (IMDUR) 30 MG 24 HR TABLET    Take 30 mg by  mouth daily. Reported on 04/04/2016   LANTUS SOLOSTAR 100 UNIT/ML SOLOSTAR PEN    Inject 70 Units into the skin daily.   LATANOPROST (XALATAN) 0.005 % OPHTHALMIC SOLUTION    Place 1 drop into both eyes at bedtime. Reported on 04/04/2016   NAFTIFINE HCL 2 % CREA    apply to affected area twice a day   NITROGLYCERIN (NITROSTAT) 0.3 MG SL TABLET    Place 1 tablet (0.3 mg total) under the tongue as needed for chest pain (not to exceed 3 tablets in one day.).   OMEPRAZOLE (PRILOSEC) 20 MG CAPSULE    Take 1 capsule (20 mg total) by mouth 2 (two) times daily before a meal.   ONDANSETRON (ZOFRAN ODT) 4 MG DISINTEGRATING TABLET    Take 1 tablet (4 mg total) by mouth every 8 (eight) hours as needed for nausea or vomiting.   ONE TOUCH LANCETS MISC    Check BS twice daily.   ONE TOUCH ULTRA TEST TEST STRIP    CHECK BLOOD SUGAR TWO TIMES DAILY AS DIRECTED   UMECLIDINIUM-VILANTEROL (ANORO ELLIPTA) 62.5-25 MCG/INH AEPB    Inhale 1 puff into the lungs daily.  Modified Medications   No medications on file  Discontinued Medications   INSULIN GLARGINE (LANTUS) 100 UNIT/ML INJECTION    Inject 60 Units into the skin at bedtime.   TRAMADOL (ULTRAM) 50 MG TABLET    Take 1 tablet (50 mg total) by mouth every 6 (six) hours as needed.     Physical Exam:  Vitals:   10/31/16 1335  BP: (!) 142/78  Pulse: 82  Resp: 18  Temp: 98.3 F (36.8 C)  TempSrc: Oral  SpO2: 98%  Weight: 190  lb 9.6 oz (86.5 kg)  Height: 6\' 2"  (1.88 m)   Body mass index is 24.47 kg/m.  Physical Exam  Constitutional: He is oriented to person, place, and time. He appears well-developed and well-nourished.  HENT:  Head: Normocephalic.  Eyes: Pupils are equal, round, and reactive to light.  Neck: Normal range of motion. Neck supple.  Cardiovascular: Normal rate, regular rhythm and normal heart sounds.   Pulmonary/Chest: Effort normal and breath sounds normal.  Musculoskeletal: Normal range of motion.  Neurological: He is alert and oriented to person, place, and time.  Skin: Skin is warm and dry.  - Laceration with staples to the right occipital scalp well approximated with 5 staples intact. No drainage, erythema, or warmth noted.  - 5 linear scabbed areas to the thoracic area of back. - Numerous scabbed areas to BLE and feet - none with erythema, drainage, or warmth.  - Medial side of right foot with nickel sized laceration that the patient applies neosporin and a Band-Aid - macerated, no drainage, erythema, or warmth noted.  - LLE laceration with Neosporin Band-Aid covering - wound bed yellow, surrounding skin macerated. No drainage, warmth or erythema noted.  - Ulcer to left great hallux covered with Neosporin Band-Aid being followed by podiatrist.   Psychiatric: He has a normal mood and affect. His behavior is normal. Judgment and thought content normal.    Labs reviewed: Basic Metabolic Panel:  Recent Labs  08/18/16 0850 10/21/16 2100 10/23/16 0007  NA 137 139 135  K 5.1 4.4 4.2  CL 111* 102 100*  CO2 20 20* 23  GLUCOSE 38* 277* 285*  BUN 26* 27* 19  CREATININE 1.77* 2.02* 1.71*  CALCIUM 9.1 8.8* 8.9   Liver Function Tests:  Recent Labs  08/09/16 1456 08/18/16 0850 10/23/16  0007  AST 17 29 53*  ALT 14 20 39  ALKPHOS 59 62 80  BILITOT 0.2 0.4 0.7  PROT 7.1 7.7 7.9  ALBUMIN 3.0* 3.4* 3.4*    Recent Labs  11/03/15 1125 10/23/16 0007  LIPASE 77* 52*   No results  for input(s): AMMONIA in the last 8760 hours. CBC:  Recent Labs  08/02/16 0530 08/03/16 1310 08/09/16 1456 10/21/16 2100 10/23/16 0007  WBC 9.1 9.9 6.3 7.2 7.4  NEUTROABS 5.0 7.7  --   --  4.8  HGB 12.3* 12.0* 12.2* 14.7 13.6  HCT 35.0* 34.4* 34.5* 42.9 40.3  MCV 101.4* 95.3 102.1* 88.6 88.2  PLT 265 293 433* 219 174   Lipid Panel:  Recent Labs  12/29/15 0927 07/12/16 0920 08/18/16 0850  CHOL 110* 150 162  HDL 23* 34* 39*  LDLCALC NOT CALC NOT CALC 90  TRIG 564* 674* 165*  CHOLHDL 4.8 4.4 4.2   TSH: No results for input(s): TSH in the last 8760 hours. A1C: Lab Results  Component Value Date   HGBA1C 7.3 (H) 08/03/2016     Assessment/Plan 1. Fall against object - Encouraged avoidance of alcohol and illicit drugs. - Continue Tylenol 1,000 mg TID PRN for pain/discomfort - Headache, vomiting, and weakness that occurred after the fall has resolved.   2. Encounter for staple removal - 5 staples removed from right occipital scalp after cleansing with an alcohol wipe. The laceration is well approximated with no drainage, erythema, or warmth. Patient advised to wait a few days before he shaves his head.   3. Abrasions of multiple sites - Let soapy water run over wounds in the shower but do not scrub them, pat dry, allow to air dry before covering with Band-Aid, to change at least daily - Continue to apply a thin layer of Neosporin. Do not apply Vaseline over wounds.  - Call for an appointment if the wounds on the left leg and right foot worsen or do not improve by next week.   4. Type 2 diabetes mellitus with vascular disease ([place]) - Diabetic shoe approval form filled out due to peripheral neuropathy and an ulcer on the left foot.  - Follow-up with podiatrist as scheduled   Follow-up as needed.  [name]. Harle Battiest  [facility] [phone] 8 am - 5 pm) [phone] (after hours)

## 2016-10-31 NOTE — Patient Instructions (Addendum)
Call for an appointment if the wounds on your left leg and right foot worsen or do not improve.

## 2016-11-10 ENCOUNTER — Inpatient Hospital Stay (HOSPITAL_COMMUNITY): Payer: Medicare Other

## 2016-11-10 ENCOUNTER — Emergency Department (HOSPITAL_COMMUNITY): Payer: Medicare Other

## 2016-11-10 ENCOUNTER — Inpatient Hospital Stay (HOSPITAL_COMMUNITY)
Admission: EM | Admit: 2016-11-10 | Discharge: 2016-11-11 | DRG: 291 | Disposition: A | Discharge status: Home / Self Care | Payer: Medicare Other | Attending: Internal Medicine | Admitting: Internal Medicine

## 2016-11-10 ENCOUNTER — Encounter (HOSPITAL_COMMUNITY): Payer: Self-pay | Admitting: Emergency Medicine

## 2016-11-10 DIAGNOSIS — K219 Gastro-esophageal reflux disease without esophagitis: Secondary | ICD-10-CM | POA: Diagnosis not present

## 2016-11-10 DIAGNOSIS — J9601 Acute respiratory failure with hypoxia: Secondary | ICD-10-CM | POA: Diagnosis present

## 2016-11-10 DIAGNOSIS — I10 Essential (primary) hypertension: Secondary | ICD-10-CM | POA: Diagnosis present

## 2016-11-10 DIAGNOSIS — E1142 Type 2 diabetes mellitus with diabetic polyneuropathy: Secondary | ICD-10-CM | POA: Diagnosis not present

## 2016-11-10 DIAGNOSIS — I998 Other disorder of circulatory system: Secondary | ICD-10-CM | POA: Diagnosis not present

## 2016-11-10 DIAGNOSIS — Z820 Family history of epilepsy and other diseases of the nervous system: Secondary | ICD-10-CM

## 2016-11-10 DIAGNOSIS — D638 Anemia in other chronic diseases classified elsewhere: Secondary | ICD-10-CM | POA: Diagnosis present

## 2016-11-10 DIAGNOSIS — I130 Hypertensive heart and chronic kidney disease with heart failure and stage 1 through stage 4 chronic kidney disease, or unspecified chronic kidney disease: Principal | ICD-10-CM | POA: Diagnosis present

## 2016-11-10 DIAGNOSIS — R0602 Shortness of breath: Secondary | ICD-10-CM | POA: Diagnosis not present

## 2016-11-10 DIAGNOSIS — G8929 Other chronic pain: Secondary | ICD-10-CM | POA: Diagnosis present

## 2016-11-10 DIAGNOSIS — Z7982 Long term (current) use of aspirin: Secondary | ICD-10-CM | POA: Diagnosis not present

## 2016-11-10 DIAGNOSIS — Z9049 Acquired absence of other specified parts of digestive tract: Secondary | ICD-10-CM | POA: Diagnosis not present

## 2016-11-10 DIAGNOSIS — B20 Human immunodeficiency virus [HIV] disease: Secondary | ICD-10-CM | POA: Diagnosis present

## 2016-11-10 DIAGNOSIS — I5043 Acute on chronic combined systolic (congestive) and diastolic (congestive) heart failure: Secondary | ICD-10-CM | POA: Diagnosis not present

## 2016-11-10 DIAGNOSIS — F17200 Nicotine dependence, unspecified, uncomplicated: Secondary | ICD-10-CM | POA: Diagnosis present

## 2016-11-10 DIAGNOSIS — I110 Hypertensive heart disease with heart failure: Secondary | ICD-10-CM | POA: Diagnosis not present

## 2016-11-10 DIAGNOSIS — I25810 Atherosclerosis of coronary artery bypass graft(s) without angina pectoris: Secondary | ICD-10-CM | POA: Diagnosis not present

## 2016-11-10 DIAGNOSIS — I509 Heart failure, unspecified: Secondary | ICD-10-CM

## 2016-11-10 DIAGNOSIS — E782 Mixed hyperlipidemia: Secondary | ICD-10-CM | POA: Diagnosis present

## 2016-11-10 DIAGNOSIS — E785 Hyperlipidemia, unspecified: Secondary | ICD-10-CM | POA: Diagnosis not present

## 2016-11-10 DIAGNOSIS — N183 Chronic kidney disease, stage 3 (moderate): Secondary | ICD-10-CM | POA: Diagnosis present

## 2016-11-10 DIAGNOSIS — I2510 Atherosclerotic heart disease of native coronary artery without angina pectoris: Secondary | ICD-10-CM | POA: Diagnosis present

## 2016-11-10 DIAGNOSIS — Z794 Long term (current) use of insulin: Secondary | ICD-10-CM | POA: Diagnosis not present

## 2016-11-10 DIAGNOSIS — I5042 Chronic combined systolic (congestive) and diastolic (congestive) heart failure: Secondary | ICD-10-CM | POA: Diagnosis present

## 2016-11-10 DIAGNOSIS — I255 Ischemic cardiomyopathy: Secondary | ICD-10-CM | POA: Diagnosis not present

## 2016-11-10 DIAGNOSIS — M109 Gout, unspecified: Secondary | ICD-10-CM | POA: Diagnosis not present

## 2016-11-10 DIAGNOSIS — Z8249 Family history of ischemic heart disease and other diseases of the circulatory system: Secondary | ICD-10-CM

## 2016-11-10 DIAGNOSIS — H409 Unspecified glaucoma: Secondary | ICD-10-CM | POA: Diagnosis present

## 2016-11-10 DIAGNOSIS — Z823 Family history of stroke: Secondary | ICD-10-CM | POA: Diagnosis not present

## 2016-11-10 DIAGNOSIS — Z79899 Other long term (current) drug therapy: Secondary | ICD-10-CM

## 2016-11-10 DIAGNOSIS — E1159 Type 2 diabetes mellitus with other circulatory complications: Secondary | ICD-10-CM | POA: Diagnosis present

## 2016-11-10 DIAGNOSIS — E1122 Type 2 diabetes mellitus with diabetic chronic kidney disease: Secondary | ICD-10-CM | POA: Diagnosis present

## 2016-11-10 DIAGNOSIS — Z833 Family history of diabetes mellitus: Secondary | ICD-10-CM | POA: Diagnosis not present

## 2016-11-10 DIAGNOSIS — F17210 Nicotine dependence, cigarettes, uncomplicated: Secondary | ICD-10-CM | POA: Diagnosis present

## 2016-11-10 DIAGNOSIS — I5022 Chronic systolic (congestive) heart failure: Secondary | ICD-10-CM

## 2016-11-10 DIAGNOSIS — I5041 Acute combined systolic (congestive) and diastolic (congestive) heart failure: Secondary | ICD-10-CM | POA: Diagnosis not present

## 2016-11-10 LAB — COMPREHENSIVE METABOLIC PANEL
ALBUMIN: 3.1 g/dL — AB (ref 3.5–5.0)
ALT: 29 U/L (ref 17–63)
ANION GAP: 9 (ref 5–15)
AST: 30 U/L (ref 15–41)
Alkaline Phosphatase: 68 U/L (ref 38–126)
BUN: 24 mg/dL — ABNORMAL HIGH (ref 6–20)
CHLORIDE: 108 mmol/L (ref 101–111)
CO2: 22 mmol/L (ref 22–32)
Calcium: 8.8 mg/dL — ABNORMAL LOW (ref 8.9–10.3)
Creatinine, Ser: 1.88 mg/dL — ABNORMAL HIGH (ref 0.61–1.24)
GFR calc Af Amer: 41 mL/min — ABNORMAL LOW (ref >60)
GFR, EST NON AFRICAN AMERICAN: 35 mL/min — AB (ref >60)
Glucose, Bld: 158 mg/dL — ABNORMAL HIGH (ref 65–99)
POTASSIUM: 4 mmol/L (ref 3.5–5.1)
Sodium: 139 mmol/L (ref 135–145)
TOTAL PROTEIN: 7.5 g/dL (ref 6.5–8.1)
Total Bilirubin: 0.6 mg/dL (ref 0.3–1.2)

## 2016-11-10 LAB — I-STAT ARTERIAL BLOOD GAS, ED
Acid-base deficit: 3 mmol/L — ABNORMAL HIGH (ref 0.0–2.0)
Bicarbonate: 21.3 mmol/L (ref 20.0–28.0)
O2 Saturation: 87 %
PCO2 ART: 34.6 mmHg (ref 32.0–48.0)
TCO2: 22 mmol/L (ref 0–100)
pH, Arterial: 7.394 (ref 7.350–7.450)
pO2, Arterial: 51 mmHg — ABNORMAL LOW (ref 83.0–108.0)

## 2016-11-10 LAB — URINALYSIS, ROUTINE W REFLEX MICROSCOPIC
Bilirubin Urine: NEGATIVE
GLUCOSE, UA: NEGATIVE mg/dL
HGB URINE DIPSTICK: NEGATIVE
KETONES UR: NEGATIVE mg/dL
LEUKOCYTES UA: NEGATIVE
Nitrite: NEGATIVE
PROTEIN: NEGATIVE mg/dL
Specific Gravity, Urine: 1.004 — ABNORMAL LOW (ref 1.005–1.030)
pH: 7 (ref 5.0–8.0)

## 2016-11-10 LAB — CBC WITH DIFFERENTIAL/PLATELET
BASOS ABS: 0 10*3/uL (ref 0.0–0.1)
BASOS PCT: 1 %
EOS PCT: 4 %
Eosinophils Absolute: 0.3 10*3/uL (ref 0.0–0.7)
HCT: 31.6 % — ABNORMAL LOW (ref 39.0–52.0)
Hemoglobin: 10 g/dL — ABNORMAL LOW (ref 13.0–17.0)
Lymphocytes Relative: 34 %
Lymphs Abs: 2.7 10*3/uL (ref 0.7–4.0)
MCH: 28.9 pg (ref 26.0–34.0)
MCHC: 31.6 g/dL (ref 30.0–36.0)
MCV: 91.3 fL (ref 78.0–100.0)
MONO ABS: 0.5 10*3/uL (ref 0.1–1.0)
MONOS PCT: 7 %
Neutro Abs: 4.5 10*3/uL (ref 1.7–7.7)
Neutrophils Relative %: 54 %
PLATELETS: 313 10*3/uL (ref 150–400)
RBC: 3.46 MIL/uL — ABNORMAL LOW (ref 4.22–5.81)
RDW: 15.5 % (ref 11.5–15.5)
WBC: 8.1 10*3/uL (ref 4.0–10.5)

## 2016-11-10 LAB — TROPONIN I
TROPONIN I: 0.03 ng/mL — AB (ref <0.03)
Troponin I: 0.03 ng/mL (ref <0.03)
Troponin I: 0.03 ng/mL (ref <0.03)

## 2016-11-10 LAB — I-STAT TROPONIN, ED: TROPONIN I, POC: 0.01 ng/mL (ref 0.00–0.08)

## 2016-11-10 LAB — TSH: TSH: 0.54 u[IU]/mL (ref 0.350–4.500)

## 2016-11-10 LAB — GLUCOSE, CAPILLARY
GLUCOSE-CAPILLARY: 218 mg/dL — AB (ref 65–99)
Glucose-Capillary: 121 mg/dL — ABNORMAL HIGH (ref 65–99)
Glucose-Capillary: 227 mg/dL — ABNORMAL HIGH (ref 65–99)
Glucose-Capillary: 144 mg/dL — ABNORMAL HIGH (ref 65–99)

## 2016-11-10 LAB — ECHOCARDIOGRAM COMPLETE
Height: 74 in
Weight: 3051.2 oz

## 2016-11-10 LAB — BRAIN NATRIURETIC PEPTIDE: B NATRIURETIC PEPTIDE 5: 740.5 pg/mL — AB (ref 0.0–100.0)

## 2016-11-10 LAB — LACTIC ACID, PLASMA: LACTIC ACID, VENOUS: 0.9 mmol/L (ref 0.5–1.9)

## 2016-11-10 MED ORDER — SODIUM CHLORIDE 0.9% FLUSH
3.0000 mL | Freq: Two times a day (BID) | INTRAVENOUS | Status: DC
  Administered 2016-11-10 – 2016-11-11 (×3): 3 mL via INTRAVENOUS

## 2016-11-10 MED ORDER — ONDANSETRON HCL 4 MG/2ML IJ SOLN: 4.0000 mg | Freq: Four times a day (QID) | INTRAMUSCULAR | Status: DC | PRN

## 2016-11-10 MED ORDER — ALLOPURINOL 100 MG PO TABS
100.0000 mg | ORAL_TABLET | Freq: Every day | ORAL | Status: DC
  Administered 2016-11-10 – 2016-11-11 (×2): 100 mg via ORAL
  Filled 2016-11-10 (×2): qty 1

## 2016-11-10 MED ORDER — ACETAMINOPHEN 325 MG PO TABS: 650.0000 mg | ORAL_TABLET | ORAL | Status: DC | PRN

## 2016-11-10 MED ORDER — GLUCOSAMINE CHONDR 1500 COMPLX PO CAPS: ORAL_CAPSULE | Freq: Every day | ORAL | Status: DC

## 2016-11-10 MED ORDER — PERFLUTREN LIPID MICROSPHERE
1.0000 mL | INTRAVENOUS | Status: AC | PRN
  Administered 2016-11-10: 2 mL via INTRAVENOUS
  Filled 2016-11-10: qty 10

## 2016-11-10 MED ORDER — ATORVASTATIN CALCIUM 20 MG PO TABS
20.0000 mg | ORAL_TABLET | Freq: Every day | ORAL | Status: DC
  Administered 2016-11-10: 20 mg via ORAL
  Filled 2016-11-10: qty 1

## 2016-11-10 MED ORDER — GABAPENTIN 300 MG PO CAPS
300.0000 mg | ORAL_CAPSULE | Freq: Three times a day (TID) | ORAL | Status: DC
  Administered 2016-11-10 – 2016-11-11 (×4): 300 mg via ORAL
  Filled 2016-11-10 (×4): qty 1

## 2016-11-10 MED ORDER — HEPARIN SODIUM (PORCINE) 5000 UNIT/ML IJ SOLN
5000.0000 [IU] | Freq: Three times a day (TID) | INTRAMUSCULAR | Status: DC
  Filled 2016-11-10 (×2): qty 1

## 2016-11-10 MED ORDER — CARVEDILOL 25 MG PO TABS
25.0000 mg | ORAL_TABLET | Freq: Two times a day (BID) | ORAL | Status: DC
  Administered 2016-11-10 – 2016-11-11 (×3): 25 mg via ORAL
  Filled 2016-11-10 (×3): qty 1

## 2016-11-10 MED ORDER — LATANOPROST 0.005 % OP SOLN
1.0000 [drp] | Freq: Every day | OPHTHALMIC | Status: DC
  Administered 2016-11-10: 1 [drp] via OPHTHALMIC
  Filled 2016-11-10 (×2): qty 2.5

## 2016-11-10 MED ORDER — INSULIN ASPART 100 UNIT/ML ~~LOC~~ SOLN
0.0000 [IU] | Freq: Three times a day (TID) | SUBCUTANEOUS | Status: DC
  Administered 2016-11-10 (×2): 1 [IU] via SUBCUTANEOUS
  Administered 2016-11-10: 3 [IU] via SUBCUTANEOUS
  Administered 2016-11-11: 1 [IU] via SUBCUTANEOUS

## 2016-11-10 MED ORDER — CARVEDILOL 12.5 MG PO TABS: 25.0000 mg | ORAL_TABLET | Freq: Two times a day (BID) | ORAL | Status: DC

## 2016-11-10 MED ORDER — ISOSORBIDE MONONITRATE ER 30 MG PO TB24
30.0000 mg | ORAL_TABLET | Freq: Every day | ORAL | Status: DC
Start: 2016-11-10 — End: 2016-11-11
  Administered 2016-11-10 – 2016-11-11 (×2): 30 mg via ORAL
  Filled 2016-11-10 (×2): qty 1

## 2016-11-10 MED ORDER — SODIUM CHLORIDE 0.9 % IV SOLN: 250.0000 mL | INTRAVENOUS | Status: DC | PRN

## 2016-11-10 MED ORDER — UMECLIDINIUM BROMIDE 62.5 MCG/INH IN AEPB
1.0000 | INHALATION_SPRAY | Freq: Every day | RESPIRATORY_TRACT | Status: DC
  Administered 2016-11-10 – 2016-11-11 (×2): 1 via RESPIRATORY_TRACT
  Filled 2016-11-10: qty 7

## 2016-11-10 MED ORDER — FUROSEMIDE 20 MG PO TABS: 20.0000 mg | ORAL_TABLET | Freq: Two times a day (BID) | ORAL | Status: DC

## 2016-11-10 MED ORDER — ATORVASTATIN CALCIUM 20 MG PO TABS: 20.0000 mg | ORAL_TABLET | Freq: Every day | ORAL | Status: DC

## 2016-11-10 MED ORDER — INSULIN GLARGINE 100 UNIT/ML ~~LOC~~ SOLN
70.0000 [IU] | Freq: Every day | SUBCUTANEOUS | Status: DC
  Administered 2016-11-10 – 2016-11-11 (×2): 70 [IU] via SUBCUTANEOUS
  Filled 2016-11-10 (×2): qty 0.7

## 2016-11-10 MED ORDER — ARFORMOTEROL TARTRATE 15 MCG/2ML IN NEBU
15.0000 ug | INHALATION_SOLUTION | Freq: Two times a day (BID) | RESPIRATORY_TRACT | Status: DC
  Administered 2016-11-10 – 2016-11-11 (×3): 15 ug via RESPIRATORY_TRACT
  Filled 2016-11-10 (×3): qty 2

## 2016-11-10 MED ORDER — HYDRALAZINE HCL 25 MG PO TABS
25.0000 mg | ORAL_TABLET | Freq: Once | ORAL | Status: AC
  Administered 2016-11-10: 25 mg via ORAL
  Filled 2016-11-10: qty 1

## 2016-11-10 MED ORDER — UMECLIDINIUM-VILANTEROL 62.5-25 MCG/INH IN AEPB: 1.0000 | INHALATION_SPRAY | Freq: Every day | RESPIRATORY_TRACT | Status: DC

## 2016-11-10 MED ORDER — HYDRALAZINE HCL 25 MG PO TABS
25.0000 mg | ORAL_TABLET | Freq: Three times a day (TID) | ORAL | Status: DC
  Administered 2016-11-10 – 2016-11-11 (×3): 25 mg via ORAL
  Filled 2016-11-10 (×4): qty 1

## 2016-11-10 MED ORDER — NITROGLYCERIN IN D5W 200-5 MCG/ML-% IV SOLN
0.0000 ug/min | Freq: Once | INTRAVENOUS | Status: AC
  Administered 2016-11-10: 5 ug/min via INTRAVENOUS
  Filled 2016-11-10: qty 250

## 2016-11-10 MED ORDER — ALBUTEROL SULFATE (2.5 MG/3ML) 0.083% IN NEBU: 2.5000 mg | INHALATION_SOLUTION | RESPIRATORY_TRACT | Status: DC | PRN

## 2016-11-10 MED ORDER — ELVITEG-COBIC-EMTRICIT-TENOFAF 150-150-200-10 MG PO TABS
1.0000 | ORAL_TABLET | Freq: Every day | ORAL | Status: DC
  Administered 2016-11-10 – 2016-11-11 (×2): 1 via ORAL
  Filled 2016-11-10 (×2): qty 1

## 2016-11-10 MED ORDER — PANTOPRAZOLE SODIUM 40 MG PO TBEC
40.0000 mg | DELAYED_RELEASE_TABLET | Freq: Every day | ORAL | Status: DC
  Administered 2016-11-10 – 2016-11-11 (×2): 40 mg via ORAL
  Filled 2016-11-10 (×2): qty 1

## 2016-11-10 MED ORDER — FUROSEMIDE 10 MG/ML IJ SOLN: 40.0000 mg | Freq: Two times a day (BID) | INTRAMUSCULAR | Status: DC

## 2016-11-10 MED ORDER — FUROSEMIDE 10 MG/ML IJ SOLN
40.0000 mg | Freq: Once | INTRAMUSCULAR | Status: AC
  Administered 2016-11-10: 40 mg via INTRAVENOUS
  Filled 2016-11-10: qty 4

## 2016-11-10 MED ORDER — ASPIRIN EC 81 MG PO TBEC
81.0000 mg | DELAYED_RELEASE_TABLET | Freq: Every day | ORAL | Status: DC
  Administered 2016-11-10 – 2016-11-11 (×2): 81 mg via ORAL
  Filled 2016-11-10 (×2): qty 1

## 2016-11-10 MED ORDER — FENOFIBRATE 54 MG PO TABS
54.0000 mg | ORAL_TABLET | Freq: Every day | ORAL | Status: DC
  Administered 2016-11-10 – 2016-11-11 (×2): 54 mg via ORAL
  Filled 2016-11-10 (×2): qty 1

## 2016-11-10 NOTE — Progress Notes (Signed)
Patient arrived from ED via stretcher and ambulated with standby assistance to the bed. Patient alert and oriented and educated on call bell, bed, and room.  Patient not having any complaints of pain and says that he feels so much better already.  Will continue to monitor.

## 2016-11-10 NOTE — Progress Notes (Signed)
RT placed patient on bipap. Patient stated he felt like it was smothering him. RT took patient off and placed on 2L [place].  MD aware. RT will continue to monitor.

## 2016-11-10 NOTE — Progress Notes (Signed)
Received critical troponin 0.03.  Sharene Butters, PA made aware.  No new orders received, will continue to monitor.

## 2016-11-10 NOTE — ED Triage Notes (Signed)
Per EMS, pt from home with c/o shortness of breath x 2 days. Pt has increased shortness of breath, diaphoresis with exertion. Given 5mg  albuterol and 1 NTG PTA. Pt denies chest pain at this time. Bp-186/100, 87% on CPAP.

## 2016-11-10 NOTE — H&P (Signed)
History and Physical    Jacob [name]:[id number] DOB: [birth year] DOA: 11/10/2016   PCP: Gildardo Cranker, DO   Patient coming from:  Home   Chief Complaint: Shortness of breath   HPI: Jacob [name] is a 66 y.o. male with medical history significant for  HIV + compliant with antiretrovirals (Dr. [name]), CHF/ ICM with last 69 in 2014 showing EF 32 to 40%, CKD, CAD s/p CABG, GERD, DM2 with peripheral neuropathy, HTN, HLD, glaucoma, brought by EMS after he began experiencing several days of progressive shortness of breath, worse at the time of presentation. On arrival, he was in respiratory distress, with low O2 sats, and wheezing. The patient was administered albuterol and placed on CPAP by EMS, with improvement of symptoms on presentation, then briefly change to BiPAP, and now is on oxygen nasal canula maintaining very well his O2 sats in the 90s. He reported intermittent chest pain on arrival, result with sublingual nitroglycerin. In addition, IV nitroglycerin was given to decreased preload and improve blood pressure, as well as given Lasix,, now failing much better. Patient denies any sick contacts.  Denies fever or chills, He denies any nausea vomiting diarrhea diaphoresis, abdominal pain, he does report lower extremity swelling, which is chronic. The patient denies taking any diuretics prior to admission.    He denies any recent trips. He has not been drinking since last December. He denies any recreational drug use at this time. He continues to smoke one pack a day of cigarettes.   ED Course:  BP 162/82   Pulse 94   Temp 97.3 F (36.3 C) (Oral)   Resp (!) 28   Ht 6\' 2"  (1.88 m)   Wt 86.2 kg (190 lb)   SpO2 97%   BMI 24.39 kg/m    bicarb was 22, sodium 139 potassium 4.0 creatinine 1.88 transaminases unremarkable T. bilirubin 0.6   BNP 740 troponin 0.01  white count 8.1 hemoglobin 10 last 2-D echo in November 2014 showed ES of 35 to 76%, grade 1 diastolic dysfunction, somewhat  reduce systolic function. Chest x-ray on 11/11/2015 shows Diffuse interstitial markings of the lungs and hazy opacities greatest in lung bases probably represents moderate pulmonary edema.   Received Lasix 40 mg IV, and is receiving his morning medications. EKG sinus rhythm, without ACS.  Review of Systems: As per HPI otherwise 10 point review of systems negative.   Past Medical History:  Diagnosis Date  . Arthritis   . Cataracts, bilateral    immature  . CHF (congestive heart failure) (HCC)    not on any meds  . Chronic back pain    stenosis  . Chronic kidney disease, unspecified   . Coronary atherosclerosis of artery bypass graft   . Coronary atherosclerosis of native coronary artery 2005   s/p surgery  . Drug abuse    hx; tested for cocaine as recently as 2/08. says he is not using drugs now - avoided defib. for this reason   . GERD (gastroesophageal reflux disease)    takes OTC meds as needed  . Glaucoma    uses eye drops daily  . Gout, unspecified    takes Allopurinol daily as well as Colchicine-if needed  . Hepatitis 1967   Hep C  . History of colon polyps    benign  . HTN (hypertension)    takes Coreg,Imdur.and Apresoline daily  . Human immunodeficiency virus (HIV) disease    takes Genvoya daily  . Hyperlipidemia    takes Atorvastatin  daily  . Ischemic cardiomyopathy   . Muscle spasm    takes Zanaflex as needed  . Nocturia   . Peripheral neuropathy (HCC)    takes gabapentin daily  . Pneumonia    hx of  . Shortness of breath dyspnea    rarely but if notices it then with exertion  . Syphilis, unspecified   . Type II or unspecified type diabetes mellitus without mention of complication, not stated as uncontrolled 2004   Lantus daily.Average fasting blood sugar 125-199    Past Surgical History:  Procedure Laterality Date  . COLONOSCOPY  2013   Dr.John Henrene Pastor   . CORONARY ARTERY BYPASS GRAFT    . laproscopic cholecystectomy  8/07  . left intertrochanteric  hip fracture  2004   s/p intermedullary nail placement 2/08  . left transverse mandibular fracture  10/05  . LUMBAR LAMINECTOMY/DECOMPRESSION MICRODISCECTOMY N/A 02/29/2016   Procedure: Left L4-5 Lateral Recess Decompression, Removal Extradural Intraspinal Facet Cyst;  Surgeon: Marybelle Killings, MD;  Location: [facility];  Service: Orthopedics;  Laterality: N/A;    Social History Social History   Social History  . Marital status: Single    Spouse name: N/A  . Number of children: 1  . Years of education: N/A   Occupational History  . retired   . part time- church    Social History Main Topics  . Smoking status: Current Every Day Smoker    Packs/day: 0.50    Years: 41.00    Types: Cigarettes  . Smokeless tobacco: Never Used  . Alcohol use 3.6 oz/week    6 Standard drinks or equivalent per week     Comment: occ.  . Drug use: No     Comment: hx of crack/cocaine 36yrs ago  . Sexual activity: Not Currently     Comment: declined condoms   Other Topics Concern  . Not on file   Social History Narrative   Retired.       As of 05/2015:   Diet: No salt    Caffeine   Married: Single    House: [place], 2 stories, 1 person (self)    Pets: No    Current/Past profession: N/A   Exercise: walks daily    Living Will: Yes    DNR   POA/HPOA: No            Allergies  Allergen Reactions  . Amphetamines Other (See Comments)    Pt is unaware of this -     Family History  Problem Relation Age of Onset  . Heart failure Father   . Hypertension Father   . Diabetes Brother   . Heart attack Brother   . Alzheimer's disease Mother   . Stroke Sister   . Diabetes Sister   . Alzheimer's disease Sister   . Hypertension Brother   . Diabetes Brother   . Drug abuse Brother   . Colon cancer Neg Hx       Prior to Admission medications   Medication Sig Start Date End Date Taking? Authorizing Provider  allopurinol (ZYLOPRIM) 100 MG tablet Take 1 tablet (100 mg total) by mouth daily. 07/15/16   Yes Gildardo Cranker, DO  aspirin EC 81 MG tablet Take 81 mg by mouth daily.   Yes Historical Provider, MD  atorvastatin (LIPITOR) 20 MG tablet Take 1 tablet (20 mg total) by mouth daily. 07/15/16  Yes Gildardo Cranker, DO  B-D UF III MINI PEN NEEDLES 31G X 5 MM MISC USE 4 TIMES  DAILY 05/24/16  Yes Gildardo Cranker, DO  carvedilol (COREG) 25 MG tablet Take 1 tablet (25 mg total) by mouth 2 (two) times daily with a meal. 11/19/15  Yes [name], MD  Choline Fenofibrate 135 MG capsule Take 1 capsule (135 mg total) by mouth daily. 07/15/16  Yes Gildardo Cranker, DO  colchicine 0.6 MG tablet Take 0.6 mg by mouth daily as needed (gout). Reported on 04/04/2016   Yes Historical Provider, MD  elvitegravir-cobicistat-emtricitabine-tenofovir (GENVOYA) 150-150-200-10 MG TABS tablet TAKE 1 TABLET BY MOUTH DAILY WITH BREAKFAST. 01/12/16  Yes [name], MD  gabapentin (NEURONTIN) 300 MG capsule take 1 capsule by mouth three times a day 10/26/16  Yes Gildardo Cranker, DO  Glucosamine-Chondroit-Vit C-Mn (GLUCOSAMINE CHONDR 1500 COMPLX PO) Take 2 tablets by mouth daily. Reported on 04/04/2016   Yes Historical Provider, MD  hydrALAZINE (APRESOLINE) 25 MG tablet take 1 tablet by mouth three times a day 09/23/16  Yes [name], MD  insulin lispro (HUMALOG KWIKPEN) 100 UNIT/ML KiwkPen Inject 0.09 mLs (9 Units total) into the skin 3 (three) times daily. Before Meals DX E11.22 Patient taking differently: Inject 12 Units into the skin 3 (three) times daily. Before Meals DX E11.22 08/17/16  Yes Gildardo Cranker, DO  isosorbide mononitrate (IMDUR) 30 MG 24 hr tablet Take 30 mg by mouth daily. Reported on 04/04/2016   Yes Historical Provider, MD  LANTUS SOLOSTAR 100 UNIT/ML Solostar Pen Inject 70 Units into the skin every morning. 35 units on each side 10/07/16  Yes Historical Provider, MD  latanoprost (XALATAN) 0.005 % ophthalmic solution Place 1 drop into both eyes at bedtime. Reported on 04/04/2016 04/23/12  Yes Historical Provider, MD    Naftifine HCl 2 % CREA apply to affected area twice a day 09/23/16  Yes Max T Hyatt, DPM  nitroGLYCERIN (NITROSTAT) 0.3 MG SL tablet Place 1 tablet (0.3 mg total) under the tongue as needed for chest pain (not to exceed 3 tablets in one day.). 02/24/16  Yes [name], MD  omeprazole (PRILOSEC) 20 MG capsule Take 1 capsule (20 mg total) by mouth 2 (two) times daily before a meal. 09/14/16  Yes Gildardo Cranker, DO  ondansetron (ZOFRAN ODT) 4 MG disintegrating tablet Take 1 tablet (4 mg total) by mouth every 8 (eight) hours as needed for nausea or vomiting. 10/23/16  Yes Deno Etienne, DO  ONE TOUCH LANCETS MISC Check BS twice daily. 02/11/16  Yes Gildardo Cranker, DO  ONE TOUCH ULTRA TEST test strip CHECK BLOOD SUGAR TWO TIMES DAILY AS DIRECTED 08/16/16  Yes Gildardo Cranker, DO  umeclidinium-vilanterol (ANORO ELLIPTA) 62.5-25 MCG/INH AEPB Inhale 1 puff into the lungs daily. 07/15/16  Yes [place], DO    Physical Exam:    Vitals:   11/10/16 0641 11/10/16 0650 11/10/16 0656 11/10/16 0730  BP: 142/80 162/79 162/79 162/82  Pulse: 81 79  94  Resp: 17 23  (!) 28  Temp:      TempSrc:      SpO2: 95% 95%  97%  Weight:      Height:           Constitutional: NAD, calm, comfortable  Vitals:   11/10/16 0641 11/10/16 0650 11/10/16 0656 11/10/16 0730  BP: 142/80 162/79 162/79 162/82  Pulse: 81 79  94  Resp: 17 23  (!) 28  Temp:      TempSrc:      SpO2: 95% 95%  97%  Weight:      Height:  Eyes: PERRL, lids and conjunctivae normal ENMT: Mucous membranes are moist. Posterior pharynx clear of any exudate or lesions.Normal dentition.  Neck: normal, supple, no masses, no thyromegaly Respiratory: mild expiratory wheezing, trace crackles at bases . Normal respiratory effort. No accessory muscle use.  Cardiovascular: Regular rate and rhythm, no murmurs / rubs / gallops. Pedal bilateral edema. 2+ pedal pulses. No carotid bruits.  Abdomen:  Obese, protruberant no tenderness, no masses palpated. No  hepatosplenomegaly. Bowel sounds positive.  Musculoskeletal: no clubbing / cyanosis. No joint deformity upper and lower extremities. Good ROM, no contractures. Normal muscle tone.  Skin: no rashes, lesions, ulcers.  Neurologic: CN 2-12 grossly intact. Sensation intact, DTR normal. Strength 5/5 in all 4.  Psychiatric: Normal judgment and insight. Alert and oriented x 3. Normal mood.     Labs on Admission: I have personally reviewed following labs and imaging studies  CBC:  Recent Labs Lab 11/10/16 0457  WBC 8.1  NEUTROABS 4.5  HGB 10.0*  HCT 31.6*  MCV 91.3  PLT 867    Basic Metabolic Panel:  Recent Labs Lab 11/10/16 0457  NA 139  K 4.0  CL 108  CO2 22  GLUCOSE 158*  BUN 24*  CREATININE 1.88*  CALCIUM 8.8*    GFR: Estimated Creatinine Clearance: 44.3 mL/min (by C-G formula based on SCr of 1.88 mg/dL (H)).  Liver Function Tests:  Recent Labs Lab 11/10/16 0457  AST 30  ALT 29  ALKPHOS 68  BILITOT 0.6  PROT 7.5  ALBUMIN 3.1*   No results for input(s): LIPASE, AMYLASE in the last 168 hours. No results for input(s): AMMONIA in the last 168 hours.  Coagulation Profile: No results for input(s): INR, PROTIME in the last 168 hours.  Cardiac Enzymes: No results for input(s): CKTOTAL, CKMB, CKMBINDEX, TROPONINI in the last 168 hours.  BNP (last 3 results) No results for input(s): PROBNP in the last 8760 hours.  HbA1C: No results for input(s): HGBA1C in the last 72 hours.  CBG: No results for input(s): GLUCAP in the last 168 hours.  Lipid Profile: No results for input(s): CHOL, HDL, LDLCALC, TRIG, CHOLHDL, LDLDIRECT in the last 72 hours.  Thyroid Function Tests: No results for input(s): TSH, T4TOTAL, FREET4, T3FREE, THYROIDAB in the last 72 hours.  Anemia Panel: No results for input(s): VITAMINB12, FOLATE, FERRITIN, TIBC, IRON, RETICCTPCT in the last 72 hours.  Urine analysis:    Component Value Date/Time   COLORURINE YELLOW 10/21/2016 2146    APPEARANCEUR CLEAR 10/21/2016 2146   LABSPEC 1.020 10/21/2016 2146   PHURINE 5.0 10/21/2016 2146   GLUCOSEU 150 (A) 10/21/2016 2146   GLUCOSEU NEG mg/dL 09/20/2010 2058   HGBUR MODERATE (A) 10/21/2016 2146   HGBUR negative 05/31/2010 0948   BILIRUBINUR NEGATIVE 10/21/2016 2146   KETONESUR NEGATIVE 10/21/2016 2146   PROTEINUR >=300 (A) 10/21/2016 2146   UROBILINOGEN 0.2 06/24/2015 2336   NITRITE NEGATIVE 10/21/2016 2146   LEUKOCYTESUR NEGATIVE 10/21/2016 2146    Sepsis Labs: @LABRCNTIP (procalcitonin:4,lacticidven:4) )No results found for this or any previous visit (from the past 240 hour(s)).   Radiological Exams on Admission: Dg Chest Port 1 View  Result Date: 11/10/2016 CLINICAL DATA:  66 y/o  M; shortness of breath for 2 days. EXAM: PORTABLE CHEST 1 VIEW COMPARISON:  08/01/2016 chest radiograph FINDINGS: Stable cardiac silhouette given projection and technique. Aortic atherosclerosis with arch calcification. Post median sternotomy. Multiple chronic right-sided rib fractures noted. Diffuse interstitial markings of the lungs and hazy opacities greatest in lung bases. IMPRESSION: Diffuse interstitial  markings of the lungs and hazy opacities greatest in lung bases probably represents moderate pulmonary edema. Underlying pneumonia is not excluded. Electronically Signed   By: Kristine Garbe M.D.   On: 11/10/2016 05:14    EKG: Independently reviewed.  Assessment/Plan Active Problems:   CHF (congestive heart failure) (HCC)   Human immunodeficiency virus (HIV) disease (HCC)   HLD (hyperlipidemia)   Gout   TOBACCO ABUSE   Essential hypertension   CAD (coronary artery disease)   Chronic combined systolic and diastolic congestive heart failure (HCC)   Ischemic cardiomyopathy   HTN (hypertension)   Type 2 diabetes mellitus with vascular disease (HCC)    Acute hypoxic respiratory failure likely secondary to acute on chronic systolic and  diastolic CHF exacerbation possible COPD  .  CXR Diffuse interstitial markings of the lungs and hazy opacities greatest in lung bases probably represents moderate pulmonary edema.  BNP 740 . Tn 0.01  Last 2 D echo November 2014 showed EF of 35 to 02%, grade 1 diastolic dysfunction, somewhat reduced systolic function. EKG sinus rhythm, without ACS. WBC normal;  Osats over 90 in 2 L  VSS  Weight 190 (stable)   Received IV 40 mg  Lasix in ED  Admit to  Telemetry obs [facility] set TSH  Cycle troponins ASA daily , continue Coreg    Daily weights and strict I/O  ECHO  CXR in am    Hypertension BP 162/82   Pulse 94    Controlled Continue home anti-hypertensive medications   Possible COPD in a patient with strong tobacco history  Continue Ellipta Albuterol nebs q 4 h prn   Nicotine patch vs gum prn was offered, patient declined  Counseled cessation  Chronic kidney disease stage III   Current Cr 1.88 at BL   Lab Results  Component Value Date   CREATININE 1.88 (H) 11/10/2016   CREATININE 1.71 (H) 10/23/2016   CREATININE 2.02 (H) 10/21/2016  Repeat CMET in am   Anemia of chronic disease Hemoglobin on admission 10, no bleeding issues   Repeat CBC in am   Type II Diabetes Current blood sugar level is 158 Lab Results  Component Value Date   HGBA1C 7.3 (H) 08/03/2016  Lantus , SSI Continue neurontin   GERD, no acute symptoms: Continue PPI  Hyperlipidemia Continue home statins  HIV+, compliant to meds Continue antiretrovirals   Gout, without flare Continue allopurinol  DVT prophylaxis: Heparin   Code Status:   Full    Family Communication:  Discussed with patient Disposition Plan: Expect patient to be discharged to home after condition improves Consults called:    None Admission status:Tele  Obs     Marlaya Turck E, PA-C [facility]   11/10/2016, 7:42 AM

## 2016-11-10 NOTE — ED Notes (Signed)
Verbal order from EDP to wean pt from nitro drip.

## 2016-11-10 NOTE — Progress Notes (Signed)
Patient is sleeping, no distress.

## 2016-11-10 NOTE — ED Provider Notes (Addendum)
[facility] DEPT Provider Note   CSN: [id number] Arrival date & time: 11/10/16  0443     History   Chief Complaint Chief Complaint  Patient presents with  . Shortness of Breath    HPI Jacob [name] is a 66 y.o. male.  Patient presents to the emergency department for evaluation of difficulty breathing. Patient has been experiencing shortness of breath intermittently for the last several days, despite his breathing significantly worsened. Patient is brought to the emergency department by ambulance. EMS reports that the patient was in respiratory distress upon their arrival. Oxygen saturations were low and he was wheezing. He was administered albuterol and placed on CPAP. Patient reports improvement at arrival to the ER. He does report that he had some anterior chest pain earlier tonight, but there is none present currently. Patient did receive 1 sublingual nitroglycerin prior to arrival.      Past Medical History:  Diagnosis Date  . Arthritis   . Cataracts, bilateral    immature  . CHF (congestive heart failure) (HCC)    not on any meds  . Chronic back pain    stenosis  . Chronic kidney disease, unspecified   . Coronary atherosclerosis of artery bypass graft   . Coronary atherosclerosis of native coronary artery 2005   s/p surgery  . Drug abuse    hx; tested for cocaine as recently as 2/08. says he is not using drugs now - avoided defib. for this reason   . GERD (gastroesophageal reflux disease)    takes OTC meds as needed  . Glaucoma    uses eye drops daily  . Gout, unspecified    takes Allopurinol daily as well as Colchicine-if needed  . Hepatitis 1967   Hep C  . History of colon polyps    benign  . HTN (hypertension)    takes Coreg,Imdur.and Apresoline daily  . Human immunodeficiency virus (HIV) disease    takes Genvoya daily  . Hyperlipidemia    takes Atorvastatin daily  . Ischemic cardiomyopathy   . Muscle spasm    takes Zanaflex as needed  . Nocturia    . Peripheral neuropathy (HCC)    takes gabapentin daily  . Pneumonia    hx of  . Shortness of breath dyspnea    rarely but if notices it then with exertion  . Syphilis, unspecified   . Type II or unspecified type diabetes mellitus without mention of complication, not stated as uncontrolled 2004   Lantus daily.Average fasting blood sugar 125-199    Patient Active Problem List   Diagnosis Date Noted  . Protein-calorie malnutrition, severe 08/03/2016  . Severe sepsis ([place]) 08/02/2016  . Pneumonia 08/02/2016  . History of lumbar laminectomy for spinal cord decompression 02/29/2016  . Renal failure (ARF), acute on chronic (HCC) 11/03/2015  . Dehydration 11/03/2015  . Alcohol dependence ([place]) 11/03/2015  . Type 2 diabetes mellitus with vascular disease ([place]) 06/17/2015  . Acute renal failure ([place]) 04/26/2015  . HTN (hypertension) 04/26/2015  . Syncope 04/26/2015  . DM (diabetes mellitus) ([place]) 04/26/2015  . Ischemic cardiomyopathy 05/12/2014  . Diarrhea 09/08/2013  . Shock ([place]) 09/06/2013  . Ulcer of lower extremity ([place]) 09/06/2013  . Community acquired pneumonia 09/06/2013  . Hyponatremia 09/06/2013  . Hyperkalemia 09/06/2013  . AKI (acute kidney injury) ([place]) 09/06/2013  . Hyperglycemia 09/06/2013  . Elevated transaminase level 09/06/2013  . Chest pain 09/06/2013  . Type II or unspecified type diabetes mellitus with unspecified complication, not stated as uncontrolled 10/22/2012  .  Black stool 04/17/2012  . Bunion of left foot 11/25/2011  . Bunion, right foot 11/25/2011  . Polysubstance abuse 07/28/2011  . Hip fracture, left ([place]) 04/04/2011  . Closed fracture of neck of femur ([place]) 04/04/2011  . Insomnia 02/18/2011  . CAD (coronary artery disease) 04/20/2009  . Chronic combined systolic and diastolic congestive heart failure ([place]) 04/20/2009  . HLD (hyperlipidemia) 11/20/2006  . Gout 11/20/2006  . TOBACCO ABUSE 11/20/2006  . Essential hypertension 11/20/2006  .  Human immunodeficiency virus (HIV) disease ([place]) 09/02/2006  . SYPHILIS 09/02/2006    Past Surgical History:  Procedure Laterality Date  . COLONOSCOPY  2013   Dr.John Henrene Pastor   . CORONARY ARTERY BYPASS GRAFT    . laproscopic cholecystectomy  8/07  . left intertrochanteric hip fracture  2004   s/p intermedullary nail placement 2/08  . left transverse mandibular fracture  10/05  . LUMBAR LAMINECTOMY/DECOMPRESSION MICRODISCECTOMY N/A 02/29/2016   Procedure: Left L4-5 Lateral Recess Decompression, Removal Extradural Intraspinal Facet Cyst;  Surgeon: Marybelle Killings, MD;  Location: [facility];  Service: Orthopedics;  Laterality: N/A;       Home Medications    Prior to Admission medications   Medication Sig Start Date End Date Taking? Authorizing Provider  allopurinol (ZYLOPRIM) 100 MG tablet Take 1 tablet (100 mg total) by mouth daily. 07/15/16   Gildardo Cranker, DO  aspirin EC 81 MG tablet Take 81 mg by mouth daily.    Historical Provider, MD  atorvastatin (LIPITOR) 20 MG tablet Take 1 tablet (20 mg total) by mouth daily. 07/15/16   Gildardo Cranker, DO  B-D UF III MINI PEN NEEDLES 31G X 5 MM MISC USE 4 TIMES DAILY 05/24/16   Gildardo Cranker, DO  carvedilol (COREG) 25 MG tablet Take 1 tablet (25 mg total) by mouth 2 (two) times daily with a meal. 11/19/15   [name], MD  cephALEXin (KEFLEX) 500 MG capsule Take 1 capsule (500 mg total) by mouth 2 (two) times daily. 10/25/16   Gardiner Barefoot, DPM  Choline Fenofibrate 135 MG capsule Take 1 capsule (135 mg total) by mouth daily. 07/15/16   Gildardo Cranker, DO  colchicine 0.6 MG tablet Take 0.6 mg by mouth daily as needed. Reported on 04/04/2016    Historical Provider, MD  elvitegravir-cobicistat-emtricitabine-tenofovir (GENVOYA) 150-150-200-10 MG TABS tablet TAKE 1 TABLET BY MOUTH DAILY WITH BREAKFAST. 01/12/16   [name], MD  gabapentin (NEURONTIN) 300 MG capsule take 1 capsule by mouth three times a day 10/26/16   Gildardo Cranker, DO  Glucosamine-Chondroit-Vit  C-Mn (GLUCOSAMINE CHONDR 1500 COMPLX PO) Take 2 tablets by mouth daily. Reported on 04/04/2016    Historical Provider, MD  hydrALAZINE (APRESOLINE) 25 MG tablet take 1 tablet by mouth three times a day 09/23/16   [name], MD  insulin lispro (HUMALOG KWIKPEN) 100 UNIT/ML KiwkPen Inject 0.09 mLs (9 Units total) into the skin 3 (three) times daily. Before Meals DX E11.22 08/17/16   Gildardo Cranker, DO  isosorbide mononitrate (IMDUR) 30 MG 24 hr tablet Take 30 mg by mouth daily. Reported on 04/04/2016    Historical Provider, MD  LANTUS SOLOSTAR 100 UNIT/ML Solostar Pen Inject 70 Units into the skin daily. 10/07/16   Historical Provider, MD  latanoprost (XALATAN) 0.005 % ophthalmic solution Place 1 drop into both eyes at bedtime. Reported on 04/04/2016 04/23/12   Historical Provider, MD  Naftifine HCl 2 % CREA apply to affected area twice a day 09/23/16   Max T Hyatt, DPM  nitroGLYCERIN (NITROSTAT)  0.3 MG SL tablet Place 1 tablet (0.3 mg total) under the tongue as needed for chest pain (not to exceed 3 tablets in one day.). 02/24/16   [name], MD  omeprazole (PRILOSEC) 20 MG capsule Take 1 capsule (20 mg total) by mouth 2 (two) times daily before a meal. 09/14/16   Gildardo Cranker, DO  ondansetron (ZOFRAN ODT) 4 MG disintegrating tablet Take 1 tablet (4 mg total) by mouth every 8 (eight) hours as needed for nausea or vomiting. 10/23/16   Deno Etienne, DO  ONE TOUCH LANCETS MISC Check BS twice daily. 02/11/16   Gildardo Cranker, DO  ONE TOUCH ULTRA TEST test strip CHECK BLOOD SUGAR TWO TIMES DAILY AS DIRECTED 08/16/16   Gildardo Cranker, DO  umeclidinium-vilanterol (ANORO ELLIPTA) 62.5-25 MCG/INH AEPB Inhale 1 puff into the lungs daily. 07/15/16   Gildardo Cranker, DO    Family History Family History  Problem Relation Age of Onset  . Heart failure Father   . Hypertension Father   . Diabetes Brother   . Heart attack Brother   . Alzheimer's disease Mother   . Stroke Sister   . Diabetes Sister   . Alzheimer's  disease Sister   . Hypertension Brother   . Diabetes Brother   . Drug abuse Brother   . Colon cancer Neg Hx     Social History Social History  Substance Use Topics  . Smoking status: Current Every Day Smoker    Packs/day: 0.50    Years: 41.00    Types: Cigarettes  . Smokeless tobacco: Never Used  . Alcohol use 3.6 oz/week    6 Standard drinks or equivalent per week     Comment: occ.     Allergies   Amphetamines   Review of Systems Review of Systems  Respiratory: Positive for shortness of breath.   All other systems reviewed and are negative.    Physical Exam Updated Vital Signs BP 170/83   Pulse 88   Temp 97.3 F (36.3 C) (Oral)   Resp 24   Ht 6\' 2"  (1.88 m)   Wt 190 lb (86.2 kg)   SpO2 94%   BMI 24.39 kg/m   Physical Exam  Constitutional: He is oriented to person, place, and time. He appears well-developed and well-nourished. No distress.  HENT:  Head: Normocephalic and atraumatic.  Right Ear: Hearing normal.  Left Ear: Hearing normal.  Nose: Nose normal.  Mouth/Throat: Oropharynx is clear and moist and mucous membranes are normal.  Eyes: Conjunctivae and EOM are normal. Pupils are equal, round, and reactive to light.  Neck: Normal range of motion. Neck supple.  Cardiovascular: Regular rhythm, S1 normal and S2 normal.  Exam reveals no gallop and no friction rub.   No murmur heard. Pulmonary/Chest: Accessory muscle usage present. Tachypnea noted. No respiratory distress. He has decreased breath sounds. He has wheezes. He has rales. He exhibits no tenderness.  Abdominal: Soft. Normal appearance and bowel sounds are normal. There is no hepatosplenomegaly. There is no tenderness. There is no rebound, no guarding, no tenderness at McBurney's point and negative Murphy's sign. No hernia.  Musculoskeletal: Normal range of motion. He exhibits edema.  Neurological: He is alert and oriented to person, place, and time. He has normal strength. No cranial nerve deficit  or sensory deficit. Coordination normal. GCS eye subscore is 4. GCS verbal subscore is 5. GCS motor subscore is 6.  Skin: Skin is warm, dry and intact. No rash noted. No cyanosis.  Psychiatric: He has a  normal mood and affect. His speech is normal and behavior is normal. Thought content normal.  Nursing note and vitals reviewed.    ED Treatments / Results  Labs (all labs ordered are listed, but only abnormal results are displayed) Labs Reviewed  I-STAT ARTERIAL BLOOD GAS, ED - Abnormal; Notable for the following:       Result Value   pO2, Arterial 51.0 (*)    Acid-base deficit 3.0 (*)    All other components within normal limits  CBC WITH DIFFERENTIAL/PLATELET  COMPREHENSIVE METABOLIC PANEL  BRAIN NATRIURETIC PEPTIDE  I-STAT TROPOININ, ED    EKG  EKG Interpretation None       Radiology Dg Chest Port 1 View  Result Date: 11/10/2016 CLINICAL DATA:  66 y/o  M; shortness of breath for 2 days. EXAM: PORTABLE CHEST 1 VIEW COMPARISON:  08/01/2016 chest radiograph FINDINGS: Stable cardiac silhouette given projection and technique. Aortic atherosclerosis with arch calcification. Post median sternotomy. Multiple chronic right-sided rib fractures noted. Diffuse interstitial markings of the lungs and hazy opacities greatest in lung bases. IMPRESSION: Diffuse interstitial markings of the lungs and hazy opacities greatest in lung bases probably represents moderate pulmonary edema. Underlying pneumonia is not excluded. Electronically Signed   By: Kristine Garbe M.D.   On: 11/10/2016 05:14    Procedures Procedures (including critical care time)  Medications Ordered in ED Medications  nitroGLYCERIN 50 mg in dextrose 5 % 250 mL (0.2 mg/mL) infusion (10 mcg/min Intravenous Rate/Dose Change 11/10/16 0535)     Initial Impression / Assessment and Plan / ED Course  I have reviewed the triage vital signs and the nursing notes.  Pertinent labs & imaging results that were available  during my care of the patient were reviewed by me and considered in my medical decision making (see chart for details).   patient presents to the emergency department for evaluation of shortness of breath. He reports that he has been experiencing shortness of breath for several days but significantly worsened tonight. Reviewing his records reveals that he does have a history of ischemic cardiomyopathy. Ejection fraction was 35-40% in 2014, does not have any recent echo. Patient is not currently on diuretic therapy. Chest x-ray shows evidence of pulmonary edema. Patient is also moderately hypertensive. Patient arrived in the ER on CPAP. He was placed on BiPAP briefly, but did not tolerate it. He has been weaned to nasal cannula oxygen and oxygen saturations are maintaining in the 90s. Initiated on IV nitroglycerin to decrease preload and improve blood pressure. Administered IV Lasix. Will require hospitalization.  Addendum: Case discussed with Dr. Loleta Books, on call for hospitalist service. He requests that the patient be titrated off of the nitroglycerin and given his normal morning meds. Patient will therefore be able to be admitted to a telemetry bed. Morning hospitalist staff will evaluate the patient.  CRITICAL CARE Performed by: Orpah Greek   Total critical care time: 30 minutes  Critical care time was exclusive of separately billable procedures and treating other patients.  Critical care was necessary to treat or prevent imminent or life-threatening deterioration.  Critical care was time spent personally by me on the following activities: development of treatment plan with patient and/or surrogate as well as nursing, discussions with consultants, evaluation of patient's response to treatment, examination of patient, obtaining history from patient or surrogate, ordering and performing treatments and interventions, ordering and review of laboratory studies, ordering and review of  radiographic studies, pulse oximetry and re-evaluation of patient's condition.  Final Clinical Impressions(s) / ED Diagnoses   Final diagnoses:  Acute on chronic combined systolic and diastolic congestive heart failure [facility])    New Prescriptions New Prescriptions   No medications on file     Orpah Greek, MD 11/10/16 [place], MD 11/10/16 [phone]

## 2016-11-10 NOTE — Progress Notes (Signed)
   Echocardiogram 2D Echocardiogram has been performed.  Kester Stimpson 11/10/2016, 1:25 PM

## 2016-11-11 ENCOUNTER — Telehealth: Payer: Self-pay

## 2016-11-11 ENCOUNTER — Inpatient Hospital Stay (HOSPITAL_COMMUNITY): Payer: Medicare Other

## 2016-11-11 DIAGNOSIS — I509 Heart failure, unspecified: Secondary | ICD-10-CM

## 2016-11-11 LAB — CBC WITH DIFFERENTIAL/PLATELET
BASOS PCT: 1 %
Basophils Absolute: 0.1 10*3/uL (ref 0.0–0.1)
EOS ABS: 0.3 10*3/uL (ref 0.0–0.7)
EOS PCT: 5 %
HCT: 30.2 % — ABNORMAL LOW (ref 39.0–52.0)
HEMOGLOBIN: 9.7 g/dL — AB (ref 13.0–17.0)
Lymphocytes Relative: 52 %
Lymphs Abs: 2.9 10*3/uL (ref 0.7–4.0)
MCH: 28.7 pg (ref 26.0–34.0)
MCHC: 32.1 g/dL (ref 30.0–36.0)
MCV: 89.3 fL (ref 78.0–100.0)
MONOS PCT: 10 %
Monocytes Absolute: 0.5 10*3/uL (ref 0.1–1.0)
NEUTROS PCT: 32 %
Neutro Abs: 1.8 10*3/uL (ref 1.7–7.7)
PLATELETS: 276 10*3/uL (ref 150–400)
RBC: 3.38 MIL/uL — AB (ref 4.22–5.81)
RDW: 15.4 % (ref 11.5–15.5)
WBC: 5.5 10*3/uL (ref 4.0–10.5)

## 2016-11-11 LAB — COMPREHENSIVE METABOLIC PANEL
ALBUMIN: 2.9 g/dL — AB (ref 3.5–5.0)
ALK PHOS: 65 U/L (ref 38–126)
ALT: 24 U/L (ref 17–63)
ANION GAP: 5 (ref 5–15)
AST: 24 U/L (ref 15–41)
BUN: 26 mg/dL — ABNORMAL HIGH (ref 6–20)
CALCIUM: 9 mg/dL (ref 8.9–10.3)
CO2: 25 mmol/L (ref 22–32)
Chloride: 108 mmol/L (ref 101–111)
Creatinine, Ser: 1.95 mg/dL — ABNORMAL HIGH (ref 0.61–1.24)
GFR calc non Af Amer: 34 mL/min — ABNORMAL LOW (ref >60)
GFR, EST AFRICAN AMERICAN: 39 mL/min — AB (ref >60)
GLUCOSE: 87 mg/dL (ref 65–99)
Potassium: 3.9 mmol/L (ref 3.5–5.1)
SODIUM: 138 mmol/L (ref 135–145)
Total Bilirubin: 0.5 mg/dL (ref 0.3–1.2)
Total Protein: 7.2 g/dL (ref 6.5–8.1)

## 2016-11-11 LAB — GLUCOSE, CAPILLARY
GLUCOSE-CAPILLARY: 131 mg/dL — AB (ref 65–99)
Glucose-Capillary: 87 mg/dL (ref 65–99)

## 2016-11-11 LAB — HEMOGLOBIN A1C
HEMOGLOBIN A1C: 6.2 % — AB (ref 4.8–5.6)
Mean Plasma Glucose: 131 mg/dL

## 2016-11-11 MED ORDER — FUROSEMIDE 40 MG PO TABS: 40.0000 mg | ORAL_TABLET | Freq: Two times a day (BID) | ORAL | 0 refills | Status: DC

## 2016-11-11 MED ORDER — FUROSEMIDE 10 MG/ML IJ SOLN
40.0000 mg | Freq: Two times a day (BID) | INTRAMUSCULAR | Status: DC
Start: 2016-11-11 — End: 2016-11-11
  Administered 2016-11-11: 40 mg via INTRAVENOUS
  Filled 2016-11-11: qty 4

## 2016-11-11 NOTE — Discharge Summary (Signed)
Physician Discharge Summary  RHET RORKE PFY:[id number] DOB: [birth year] DOA: 11/10/2016  PCP: Gildardo Cranker, DO  Admit date: 11/10/2016 Discharge date: 11/11/2016  Admitted From: Home  Disposition:  Home   Recommendations for Outpatient Follow-up:  1. Needs B-met early next week to follow renal function.  2. Further adjustment of diuretics as needed.    Discharge Condition: Stable.  CODE STATUS: Full code.  Diet recommendation: Heart Healthy  Brief/Interim Summary: Jacob [name] is a 66 y.o. male with a Past Medical History sig for DM, HIV, HTN, CKD CHF, Glaucoma, Gout, HLD, neuropathy  who presents with sob. Acute CHF exacerbation. Improving with diuresis. Pt reports he is up at least 7lbs from his dry weight when he weighed himself at home before coming to ED and thinks po intake is to blame.   1-Acute hypoxic respiratory failure likely secondary to acute on chronic systolic and  diastolic CHF exacerbation   CXR Diffuse interstitial markings of the lungs and hazy opacities greatest in lung bases probably represents moderate pulmonary edema.  BNP 740  He received 40 Mg IV on 1-18. Good diuresis. X ray still with Pulmonary edema. Weight down to 188. Dry weight 183. Patient does not wants to stay in the hospital, he is asking to be discharge. He will take lasix by mouth. He will be discharge on 40 mg BID. Needs close follow up to check renal function.    Hypertension Continue home anti-hypertensive medications   Possible COPD in a patient with strong tobacco history  Continue Ellipta  Counseled cessation  Chronic kidney disease stage III   Current Cr 1.88 at [facility]   Recent Labs       Lab Results  Component Value Date   CREATININE 1.88 (H) 11/10/2016   CREATININE 1.71 (H) 10/23/2016   CREATININE 2.02 (H) 10/21/2016    Cr at baseline.  He will be discharge on lasix. Needs close follow up/    Anemia of chronic disease Hemoglobin on admission 10, no bleeding issues     Diabetes; continue home regime at discharge    GERD, no acute symptoms: Continue PPI  Hyperlipidemia Continue home statins  HIV+, compliant to meds Continue antiretrovirals   Gout, without flare Continue allopurinol    Discharge Diagnoses:  Active Problems:   Human immunodeficiency virus (HIV) disease ([place])   HLD (hyperlipidemia)   Gout   TOBACCO ABUSE   Essential hypertension   CAD (coronary artery disease)   Chronic combined systolic and diastolic congestive heart failure (HCC)   Ischemic cardiomyopathy   HTN (hypertension)   Type 2 diabetes mellitus with vascular disease (HCC)   CHF (congestive heart failure) ([place])    Discharge Instructions  Discharge Instructions    Diet - low sodium heart healthy    Complete by:  As directed    Increase activity slowly    Complete by:  As directed      Allergies as of 11/11/2016      Reactions   Amphetamines Other (See Comments)   Pt is unaware of this -       Medication List    TAKE these medications   allopurinol 100 MG tablet Commonly known as:  ZYLOPRIM Take 1 tablet (100 mg total) by mouth daily.   aspirin EC 81 MG tablet Take 81 mg by mouth daily.   atorvastatin 20 MG tablet Commonly known as:  LIPITOR Take 1 tablet (20 mg total) by mouth daily.   B-D UF III MINI PEN NEEDLES 31G  X 5 MM Misc Generic drug:  Insulin Pen Needle USE 4 TIMES DAILY   carvedilol 25 MG tablet Commonly known as:  COREG Take 1 tablet (25 mg total) by mouth 2 (two) times daily with a meal.   Choline Fenofibrate 135 MG capsule Take 1 capsule (135 mg total) by mouth daily.   colchicine 0.6 MG tablet Take 0.6 mg by mouth daily as needed (gout). Reported on 04/04/2016   elvitegravir-cobicistat-emtricitabine-tenofovir 150-150-200-10 MG Tabs tablet Commonly known as:  GENVOYA TAKE 1 TABLET BY MOUTH DAILY WITH BREAKFAST.   furosemide 40 MG tablet Commonly known as:  LASIX Take 1 tablet (40 mg total) by mouth 2 (two)  times daily.   gabapentin 300 MG capsule Commonly known as:  NEURONTIN take 1 capsule by mouth three times a day   GLUCOSAMINE CHONDR 1500 COMPLX PO Take 2 tablets by mouth daily. Reported on 04/04/2016   hydrALAZINE 25 MG tablet Commonly known as:  APRESOLINE take 1 tablet by mouth three times a day   insulin lispro 100 UNIT/ML KiwkPen Commonly known as:  HUMALOG KWIKPEN Inject 0.09 mLs (9 Units total) into the skin 3 (three) times daily. Before Meals DX E11.22 What changed:  how much to take  additional instructions   isosorbide mononitrate 30 MG 24 hr tablet Commonly known as:  IMDUR Take 30 mg by mouth daily. Reported on 04/04/2016   LANTUS SOLOSTAR 100 UNIT/ML Solostar Pen Generic drug:  Insulin Glargine Inject 70 Units into the skin every morning. 35 units on each side   latanoprost 0.005 % ophthalmic solution Commonly known as:  XALATAN Place 1 drop into both eyes at bedtime. Reported on 04/04/2016   Naftifine HCl 2 % Crea apply to affected area twice a day   nitroGLYCERIN 0.3 MG SL tablet Commonly known as:  NITROSTAT Place 1 tablet (0.3 mg total) under the tongue as needed for chest pain (not to exceed 3 tablets in one day.).   omeprazole 20 MG capsule Commonly known as:  PRILOSEC Take 1 capsule (20 mg total) by mouth 2 (two) times daily before a meal.   ondansetron 4 MG disintegrating tablet Commonly known as:  ZOFRAN ODT Take 1 tablet (4 mg total) by mouth every 8 (eight) hours as needed for nausea or vomiting.   ONE TOUCH LANCETS Misc Check BS twice daily.   ONE TOUCH ULTRA TEST test strip Generic drug:  glucose blood CHECK BLOOD SUGAR TWO TIMES DAILY AS DIRECTED   umeclidinium-vilanterol 62.5-25 MCG/INH Aepb Commonly known as:  ANORO ELLIPTA Inhale 1 puff into the lungs daily.       Allergies  Allergen Reactions  . Amphetamines Other (See Comments)    Pt is unaware of this -     Consultations:  none   Procedures/Studies: Dg Chest 2  View  Result Date: 11/11/2016 CLINICAL DATA:  Shortness of Breath EXAM: CHEST  2 VIEW COMPARISON:  November 10, 2016 ; November 03, 2015 FINDINGS: There is a small pleural effusion on the right. There is less interstitial edema compared to 1 day prior. There remains interstitial edema superimposed on interstitial thickening, likely due to a degree of underlying fibrosis. There is no airspace consolidation. Heart is mildly enlarged with mild pulmonary venous hypertension. There is aortic atherosclerosis. There old healed rib fractures on the right. Native coronary artery calcification appreciable ; patient is status post coronary artery bypass grafting. IMPRESSION: There remains a degree of appearing congestive heart failure superimposed on chronic interstitial thickening with slightly less edema  compared to 1 day prior. No new opacity. Stable cardiac silhouette. There is aortic atherosclerosis. Patient is status post coronary artery bypass grafting. Native coronary artery calcification is appreciable. Electronically Signed   By: Lowella Grip III M.D.   On: 11/11/2016 07:07   Dg Lumbar Spine Complete  Result Date: 10/22/2016 CLINICAL DATA:  Fall 6 days ago.  Pain. EXAM: LUMBAR SPINE - COMPLETE 4+ VIEW COMPARISON:  None. FINDINGS: Degenerative disc disease at L5-S1 with disc space narrowing, spurring and vacuum disc. Degenerative facet disease throughout the lumbar spine. Normal alignment. No fracture. Diffuse aortic and iliac calcifications. No visible aneurysm. IMPRESSION: No acute bony abnormality. Aortoiliac atherosclerosis. Electronically Signed   By: Rolm Baptise M.D.   On: 10/22/2016 13:18   Ct Head Wo Contrast  Result Date: 10/21/2016 CLINICAL DATA:  Fall.  History of HIV, diabetes, hypertension. EXAM: CT HEAD WITHOUT CONTRAST TECHNIQUE: Contiguous axial images were obtained from the base of the skull through the vertex without intravenous contrast. COMPARISON:  CT HEAD October 20, 2016  FINDINGS: BRAIN: The ventricles and sulci are normal for age. No intraparenchymal hemorrhage, mass effect nor midline shift. Patchy supratentorial white matter hypodensities less than expected for patient's age, though non-specific are most compatible with chronic small vessel ischemic disease. No acute large vascular territory infarcts. No abnormal extra-axial fluid collections. Basal cisterns are patent. VASCULAR: Moderate calcific atherosclerosis of the carotid siphons. SKULL: No skull fracture. Small RIGHT frontal and RIGHT occipital scalp hematomas with RIGHT occipital scalp skin staples. SINUSES/ORBITS: The mastoid air-cells and included paranasal sinuses are well-aerated.The included ocular globes and orbital contents are non-suspicious. OTHER: None. IMPRESSION: No acute intracranial process ; negative CT HEAD for age. Electronically Signed   By: Elon Alas M.D.   On: 10/21/2016 23:54   Ct Head Wo Contrast  Result Date: 10/20/2016 CLINICAL DATA:  Unwitnessed fall at 2 a.m. with possible loss of consciousness. Right occipital scalp laceration. Hit head on glass table. EXAM: CT HEAD WITHOUT CONTRAST TECHNIQUE: Contiguous axial images were obtained from the base of the skull through the vertex without intravenous contrast. COMPARISON:  CT head 04/26/2015 FINDINGS: Brain: No acute infarct, hemorrhage, or mass lesion is present. The ventricles are of normal size. No significant extraaxial fluid collection is present. The basal ganglia are intact. No focal cortical lesions are present. Vascular: Dense atherosclerotic calcifications are present within the cavernous internal carotid arteries and at the dural margin of the vertebral arteries. There is no hyperdense vessel. Skull: The calvarium is intact. No acute fracture is present. No focal lytic or blastic lesions are evident. Sinuses/Orbits: Mild mucosal thickening is present in the anterior ethmoid air cells and inferior frontal sinuses. The paranasal  sinuses and mastoid air cells are otherwise clear. The globes and orbits are within normal limits. Other: Soft tissue swelling is present in the occipital scalp without an underlying fracture or foreign body. IMPRESSION: 1. Soft tissue swelling within the occipital scalp without underlying fracture or radiopaque foreign body. 2. Normal CT appearance of the brain for age. 3. Atherosclerosis. Electronically Signed   By: [name] Morelle M.D.   On: 10/20/2016 07:23   Dg Chest Port 1 View  Result Date: 11/10/2016 CLINICAL DATA:  66 y/o  M; shortness of breath for 2 days. EXAM: PORTABLE CHEST 1 VIEW COMPARISON:  08/01/2016 chest radiograph FINDINGS: Stable cardiac silhouette given projection and technique. Aortic atherosclerosis with arch calcification. Post median sternotomy. Multiple chronic right-sided rib fractures noted. Diffuse interstitial markings of the lungs and hazy  opacities greatest in lung bases. IMPRESSION: Diffuse interstitial markings of the lungs and hazy opacities greatest in lung bases probably represents moderate pulmonary edema. Underlying pneumonia is not excluded. Electronically Signed   By: Kristine Garbe M.D.   On: 11/10/2016 05:14    (Echo, Carotid, EGD, Colonoscopy, ERCP)    Subjective:   Discharge Exam: Vitals:   11/10/16 2059 11/11/16 0454  BP:  (!) 151/80  Pulse: 69 66  Resp: 18 18  Temp:  98.1 F (36.7 C)   Vitals:   11/10/16 2059 11/11/16 0454 11/11/16 0839 11/11/16 0841  BP:  (!) 151/80    Pulse: 69 66    Resp: 18 18    Temp:  98.1 F (36.7 C)    TempSrc:  Oral    SpO2: 97% 98% 96% 96%  Weight:  85.4 kg (188 lb 3.2 oz)    Height:        General: Pt is alert, awake, not in acute distress Cardiovascular: RRR, S1/S2 +, no rubs, no gallops Respiratory: CTA bilaterally, no wheezing, no rhonchi Abdominal: Soft, NT, ND, bowel sounds + Extremities: no edema, no cyanosis    The results of significant diagnostics from this hospitalization  (including imaging, microbiology, ancillary and laboratory) are listed below for reference.     Microbiology: No results found for this or any previous visit (from the past 240 hour(s)).   Labs: BNP (last 3 results)  Recent Labs  11/10/16 0457  BNP 885.0*   Basic Metabolic Panel:  Recent Labs Lab 11/10/16 0457 11/11/16 0616  NA 139 138  K 4.0 3.9  CL 108 108  CO2 22 25  GLUCOSE 158* 87  BUN 24* 26*  CREATININE 1.88* 1.95*  CALCIUM 8.8* 9.0   Liver Function Tests:  Recent Labs Lab 11/10/16 0457 11/11/16 0616  AST 30 24  ALT 29 24  ALKPHOS 68 65  BILITOT 0.6 0.5  PROT 7.5 7.2  ALBUMIN 3.1* 2.9*   No results for input(s): LIPASE, AMYLASE in the last 168 hours. No results for input(s): AMMONIA in the last 168 hours. CBC:  Recent Labs Lab 11/10/16 0457 11/11/16 0616  WBC 8.1 5.5  NEUTROABS 4.5 1.8  HGB 10.0* 9.7*  HCT 31.6* 30.2*  MCV 91.3 89.3  PLT 313 276   Cardiac Enzymes:  Recent Labs Lab 11/10/16 0757 11/10/16 1425 11/10/16 1840  TROPONINI 0.03* 0.03* 0.03*   BNP: Invalid input(s): POCBNP CBG:  Recent Labs Lab 11/10/16 0830 11/10/16 1214 11/10/16 1623 11/10/16 2117 11/11/16 0551  GLUCAP 121* 227* 144* 218* 87   D-Dimer No results for input(s): DDIMER in the last 72 hours. Hgb A1c  Recent Labs  11/10/16 0716  HGBA1C 6.2*   Lipid Profile No results for input(s): CHOL, HDL, LDLCALC, TRIG, CHOLHDL, LDLDIRECT in the last 72 hours. Thyroid function studies  Recent Labs  11/10/16 0757  TSH 0.540   Anemia work up No results for input(s): VITAMINB12, FOLATE, FERRITIN, TIBC, IRON, RETICCTPCT in the last 72 hours. Urinalysis    Component Value Date/Time   COLORURINE COLORLESS (A) 11/10/2016 0839   APPEARANCEUR CLEAR 11/10/2016 0839   LABSPEC 1.004 (L) 11/10/2016 0839   PHURINE 7.0 11/10/2016 0839   GLUCOSEU NEGATIVE 11/10/2016 0839   GLUCOSEU NEG mg/dL 09/20/2010 2058   HGBUR NEGATIVE 11/10/2016 0839   HGBUR negative  05/31/2010 0948   BILIRUBINUR NEGATIVE 11/10/2016 0839   KETONESUR NEGATIVE 11/10/2016 0839   PROTEINUR NEGATIVE 11/10/2016 0839   UROBILINOGEN 0.2 06/24/2015 2336   NITRITE NEGATIVE  11/10/2016 0839   LEUKOCYTESUR NEGATIVE 11/10/2016 0839   Sepsis Labs Invalid input(s): PROCALCITONIN,  WBC,  LACTICIDVEN Microbiology No results found for this or any previous visit (from the past 240 hour(s)).   Time coordinating discharge: Over 30 minutes  SIGNED:   Elmarie Shiley, MD  [facility] 11/11/2016, 11:04 AM Pager   If 7PM-7AM, please contact night-coverage [email or web address] Password [email or web address]

## 2016-11-11 NOTE — Telephone Encounter (Signed)
Transition Care Management Follow-Up Telephone Call   Date discharged and where: [facility]; 11/11/16  How have you been since you were released from the hospital? Pt states she feels better than he did; came home, ate some Cheetos and is currently smoking a cigarette. (We discussed the urgency of him not eating salty foods and smoking and he voiced his understanding.) We also went over how to use his BP machine; advised pt to take his BP about 3 times per day and to write them down; pt voiced understanding.   Any patient concerns? None  Items Reviewed:   Meds: y Allergies:y  Dietary Changes Reviewed:y  Functional Questionnaire:  Independent-I Dependent-D  ADLs:   Dressing- i    Eating-i   Maintaining continence-i   Transferring-i   Transportation-d   Meal Prep-i   Managing Meds-i   Confirmed importance and Date/Time of follow-up visits scheduled:TOC Appt w/ Dr. [name] on 11/18/16 at 11am Confirmed with patient if condition worsens to call PCP or go to the Emergency Dept. Patient was given office number and encouraged to call back with questions or concerns: Darreld Mclean

## 2016-11-11 NOTE — Progress Notes (Signed)
Pt slept well and no any complications noted overnight. Vitals stable

## 2016-11-11 NOTE — Consult Note (Signed)
   [facility] Inpatient Consult   11/11/2016  Jacob [name] [birth year] [id number]   Patient was assessed for [facility] as a beneficiary of his [name].  Patient has had 2 hospitalizations and 4 ED visits in the past 2 weeks.  Chart review reveals the patient is Jacob [name] is a 66 y.o. male with medical history significant for  HIV + compliant with antiretrovirals (Dr. [name]), CHF/ ICM with last 61 in 2014 showing EF 52 to 40%, CKD, CAD s/p CABG, GERD, DM2 with peripheral neuropathy, HTN, HLD, glaucoma, brought by EMS after he began experiencing several days of progressive shortness of breath, worse at the time of presentation. On arrival, he was in respiratory distress, with low O2 sats, and wheezing. The patient was administered albuterol and placed on CPAP by EMS, with improvement of symptoms on presentation, then briefly change to BiPAP, and now is on oxygen nasal canula maintaining very well his O2 sats in the 90s. He reported intermittent chest pain on arrival, result with sublingual nitroglycerin. Met with the patient at bedside to explain [facility] post hospital follow up.  He states he would be interested in a nurse to follow up.  He states he weighs daily with the [facility] HF tele-monitoring program. Consent form signed.  Folder with [place] [facility] information provided.  Explained that [facility] does not replace or interfere with any services arranged by the inpatient staff.  For questions, please contact:  Natividad Brood, RN BSN [facility] Liaison  [phone] business mobile phone Toll free office [phone]

## 2016-11-11 NOTE — Progress Notes (Signed)
Pt c/a/ox3, VS WNL. AVS reviewed with patient, medications reviewed. Call placed to CCMD to notify of telemetry monitoring d/c.  One Lasix prescription provided to pt. Pt advised will escort him to the lobby once he gets dressed. Upon return to the room, pt was already gone.

## 2016-11-14 ENCOUNTER — Other Ambulatory Visit: Payer: Self-pay

## 2016-11-14 ENCOUNTER — Ambulatory Visit (INDEPENDENT_AMBULATORY_CARE_PROVIDER_SITE_OTHER): Payer: Medicare Other | Admitting: Podiatry

## 2016-11-14 DIAGNOSIS — E08621 Diabetes mellitus due to underlying condition with foot ulcer: Secondary | ICD-10-CM

## 2016-11-14 DIAGNOSIS — L97522 Non-pressure chronic ulcer of other part of left foot with fat layer exposed: Secondary | ICD-10-CM

## 2016-11-14 DIAGNOSIS — I70235 Atherosclerosis of native arteries of right leg with ulceration of other part of foot: Secondary | ICD-10-CM | POA: Diagnosis not present

## 2016-11-14 DIAGNOSIS — L84 Corns and callosities: Secondary | ICD-10-CM

## 2016-11-14 DIAGNOSIS — L851 Acquired keratosis [keratoderma] palmaris et plantaris: Secondary | ICD-10-CM

## 2016-11-14 DIAGNOSIS — M79671 Pain in right foot: Secondary | ICD-10-CM

## 2016-11-14 DIAGNOSIS — E1143 Type 2 diabetes mellitus with diabetic autonomic (poly)neuropathy: Secondary | ICD-10-CM

## 2016-11-14 DIAGNOSIS — E0843 Diabetes mellitus due to underlying condition with diabetic autonomic (poly)neuropathy: Secondary | ICD-10-CM | POA: Diagnosis not present

## 2016-11-14 DIAGNOSIS — M79672 Pain in left foot: Secondary | ICD-10-CM

## 2016-11-14 DIAGNOSIS — L97509 Non-pressure chronic ulcer of other part of unspecified foot with unspecified severity: Secondary | ICD-10-CM | POA: Diagnosis not present

## 2016-11-14 DIAGNOSIS — L97512 Non-pressure chronic ulcer of other part of right foot with fat layer exposed: Secondary | ICD-10-CM

## 2016-11-14 DIAGNOSIS — I70245 Atherosclerosis of native arteries of left leg with ulceration of other part of foot: Secondary | ICD-10-CM | POA: Diagnosis not present

## 2016-11-14 NOTE — Patient Outreach (Signed)
[facility]) Care Management  [place]  11/14/2016   Jacob [name] [birth year] [id number]  Subjective: No subjective data  Objective: No objective data  Encounter Medications:  Outpatient Encounter Prescriptions as of 11/14/2016  Medication Sig  . allopurinol (ZYLOPRIM) 100 MG tablet Take 1 tablet (100 mg total) by mouth daily.  [name] aspirin EC 81 MG tablet Take 81 mg by mouth daily.  [name] atorvastatin (LIPITOR) 20 MG tablet Take 1 tablet (20 mg total) by mouth daily.  . B-D UF III MINI PEN NEEDLES 31G X 5 MM MISC USE 4 TIMES DAILY  . carvedilol (COREG) 25 MG tablet Take 1 tablet (25 mg total) by mouth 2 (two) times daily with a meal.  . Choline Fenofibrate 135 MG capsule Take 1 capsule (135 mg total) by mouth daily.  . colchicine 0.6 MG tablet Take 0.6 mg by mouth daily as needed (gout). Reported on 04/04/2016  . elvitegravir-cobicistat-emtricitabine-tenofovir (GENVOYA) 150-150-200-10 MG TABS tablet TAKE 1 TABLET BY MOUTH DAILY WITH BREAKFAST.  . furosemide (LASIX) 40 MG tablet Take 1 tablet (40 mg total) by mouth 2 (two) times daily.  [name] gabapentin (NEURONTIN) 300 MG capsule take 1 capsule by mouth three times a day  . Glucosamine-Chondroit-Vit C-Mn (GLUCOSAMINE CHONDR 1500 COMPLX PO) Take 2 tablets by mouth daily. Reported on 04/04/2016  . hydrALAZINE (APRESOLINE) 25 MG tablet take 1 tablet by mouth three times a day  . insulin lispro (HUMALOG KWIKPEN) 100 UNIT/ML KiwkPen Inject 0.09 mLs (9 Units total) into the skin 3 (three) times daily. Before Meals DX E11.22 (Patient taking differently: Inject 12 Units into the skin 3 (three) times daily. Before Meals DX E11.22)  . isosorbide mononitrate (IMDUR) 30 MG 24 hr tablet Take 30 mg by mouth daily. Reported on 04/04/2016  . LANTUS SOLOSTAR 100 UNIT/ML Solostar Pen Inject 70 Units into the skin every morning. 35 units on each side  . latanoprost (XALATAN) 0.005 % ophthalmic solution Place 1 drop into both eyes at bedtime.  Reported on 04/04/2016  . Naftifine HCl 2 % CREA apply to affected area twice a day  . nitroGLYCERIN (NITROSTAT) 0.3 MG SL tablet Place 1 tablet (0.3 mg total) under the tongue as needed for chest pain (not to exceed 3 tablets in one day.).  [name] omeprazole (PRILOSEC) 20 MG capsule Take 1 capsule (20 mg total) by mouth 2 (two) times daily before a meal.  . ondansetron (ZOFRAN ODT) 4 MG disintegrating tablet Take 1 tablet (4 mg total) by mouth every 8 (eight) hours as needed for nausea or vomiting.  . ONE TOUCH LANCETS MISC Check BS twice daily.  . ONE TOUCH ULTRA TEST test strip CHECK BLOOD SUGAR TWO TIMES DAILY AS DIRECTED  . umeclidinium-vilanterol (ANORO ELLIPTA) 62.5-25 MCG/INH AEPB Inhale 1 puff into the lungs daily.   No facility-administered encounter medications on file as of 11/14/2016.     Functional Status:  In your present state of health, do you have any difficulty performing the following activities: 11/10/2016 08/18/2016  Hearing? N N  Vision? N N  Difficulty concentrating or making decisions? N Y  Walking or climbing stairs? N N  Dressing or bathing? N N  Doing errands, shopping? N N  Preparing Food and eating ? - N  Using the Toilet? - N  In the past six months, have you accidently leaked urine? - N  Do you have problems with loss of bowel control? - N  Managing your Medications? - N  Managing your  Finances? - N  Housekeeping or managing your Housekeeping? - N  Some recent data might be hidden    Fall/Depression Screening: PHQ 2/9 Scores 08/18/2016 07/12/2016 04/14/2016 08/07/2015 06/17/2015 12/29/2014 06/03/2014  PHQ - 2 Score 0 0 0 0 2 0 0  PHQ- 9 Score - - - - 8 - -    Assessment: 66 year old with history of diabetes, hypertension, human immuno-deficiency virus,CKD, glaucoma admitted 1/18-1/19 with shortness of breath/ congestive heart failure. RNCM called for transition of care call. RNCM called both numbers listed in chart. No answer. HIPPA compliant voice message left  on both answering machines.  Plan: per protocol-telephonic call tomorrow if no return call. update assigned RNCM, P. Ileene Patrick for continued outreach.  Covering RNCM: Thea Silversmith, RN, MSN, [place] Coordinator Cell: [phone]

## 2016-11-15 ENCOUNTER — Other Ambulatory Visit: Payer: Self-pay

## 2016-11-16 NOTE — Patient Outreach (Signed)
[facility]) Care Management  11/16/2016   Jacob [name] 07-15-1951 [id number]  Subjective:  I just got out of the hospital, I was swollen up on one side  Objective:  Telephonic encounter  Current Medications:  Current Outpatient Prescriptions  Medication Sig Dispense Refill  . allopurinol (ZYLOPRIM) 100 MG tablet Take 1 tablet (100 mg total) by mouth daily. 30 tablet 3  . aspirin EC 81 MG tablet Take 81 mg by mouth daily.    [name] atorvastatin (LIPITOR) 20 MG tablet Take 1 tablet (20 mg total) by mouth daily. 30 tablet 3  . B-D UF III MINI PEN NEEDLES 31G X 5 MM MISC USE 4 TIMES DAILY 150 each 11  . carvedilol (COREG) 25 MG tablet Take 1 tablet (25 mg total) by mouth 2 (two) times daily with a meal. 180 tablet 3  . Choline Fenofibrate 135 MG capsule Take 1 capsule (135 mg total) by mouth daily. 30 capsule 6  . colchicine 0.6 MG tablet Take 0.6 mg by mouth daily as needed (gout). Reported on 04/04/2016    . elvitegravir-cobicistat-emtricitabine-tenofovir (GENVOYA) 150-150-200-10 MG TABS tablet TAKE 1 TABLET BY MOUTH DAILY WITH BREAKFAST. 30 tablet 11  . furosemide (LASIX) 40 MG tablet Take 1 tablet (40 mg total) by mouth 2 (two) times daily. 30 tablet 0  . gabapentin (NEURONTIN) 300 MG capsule take 1 capsule by mouth three times a day 90 capsule 1  . Glucosamine-Chondroit-Vit C-Mn (GLUCOSAMINE CHONDR 1500 COMPLX PO) Take 2 tablets by mouth daily. Reported on 04/04/2016    . hydrALAZINE (APRESOLINE) 25 MG tablet take 1 tablet by mouth three times a day 90 tablet 8  . insulin lispro (HUMALOG KWIKPEN) 100 UNIT/ML KiwkPen Inject 0.09 mLs (9 Units total) into the skin 3 (three) times daily. Before Meals DX E11.22 (Patient taking differently: Inject 12 Units into the skin 3 (three) times daily. Before Meals DX E11.22) 15 mL 5  . isosorbide mononitrate (IMDUR) 30 MG 24 hr tablet Take 30 mg by mouth daily. Reported on 04/04/2016    . LANTUS SOLOSTAR 100 UNIT/ML Solostar Pen Inject 70  Units into the skin every morning. 35 units on each side  0  . latanoprost (XALATAN) 0.005 % ophthalmic solution Place 1 drop into both eyes at bedtime. Reported on 04/04/2016    . Naftifine HCl 2 % CREA apply to affected area twice a day 60 g 11  . nitroGLYCERIN (NITROSTAT) 0.3 MG SL tablet Place 1 tablet (0.3 mg total) under the tongue as needed for chest pain (not to exceed 3 tablets in one day.). 25 tablet 2  . omeprazole (PRILOSEC) 20 MG capsule Take 1 capsule (20 mg total) by mouth 2 (two) times daily before a meal. 60 capsule 1  . ondansetron (ZOFRAN ODT) 4 MG disintegrating tablet Take 1 tablet (4 mg total) by mouth every 8 (eight) hours as needed for nausea or vomiting. 20 tablet 0  . ONE TOUCH LANCETS MISC Check BS twice daily. 200 each 3  . ONE TOUCH ULTRA TEST test strip CHECK BLOOD SUGAR TWO TIMES DAILY AS DIRECTED 200 each 6  . umeclidinium-vilanterol (ANORO ELLIPTA) 62.5-25 MCG/INH AEPB Inhale 1 puff into the lungs daily. 14 each 3   No current facility-administered medications for this visit.     Functional Status:  In your present state of health, do you have any difficulty performing the following activities: 11/10/2016 08/18/2016  Hearing? N N  Vision? N N  Difficulty concentrating or making decisions? N  Y  Walking or climbing stairs? N N  Dressing or bathing? N N  Doing errands, shopping? N N  Preparing Food and eating ? - N  Using the Toilet? - N  In the past six months, have you accidently leaked urine? - N  Do you have problems with loss of bowel control? - N  Managing your Medications? - N  Managing your Finances? - N  Housekeeping or managing your Housekeeping? - N  Some recent data might be hidden    Fall/Depression Screening: PHQ 2/9 Scores 08/18/2016 07/12/2016 04/14/2016 08/07/2015 06/17/2015 12/29/2014 06/03/2014  PHQ - 2 Score 0 0 0 0 2 0 0  PHQ- 9 Score - - - - 8 - -   THN CM Care Plan Problem One   Flowsheet Row Most Recent Value  Care Plan Problem One   knowledge deficit related to low sodium diet  Role Documenting the Problem One  Care Management [place] for Problem One  Active  THN Long Term Goal (31-90 days)  In the next 31 days, patient will be able to create 2 examples of low sodium diets  THN Long Term Goal Start Date  11/15/16  Interventions for Problem One Long Term Goal  transition of care call ot assess barriers   THN CM Short Term Goal #1 (0-30 days)  in the next 14 days, patient will  meet with [facility] RNCM for low sodium diet education  THN CM Short Term Goal #1 Start Date  11/15/16  Interventions for Short Term Goal #1  initia assessment of patient's knowledge, creation of cas management care plan  THN CM Short Term Goal #2 (0-30 days)  in the next 28 days, patient will weight 21 out of 28 times  THN CM Short Term Goal #2 Start Date  11/15/16  Interventions for Short Term Goal #2  assessment of educational needs completed     Assessment:  Patient recently discharged from the acute care setting with a heart failure diagnosis Patient expresses need for diet education, assistance with identification of high sodium content foods  Plan:  Home visit within the next 21 days for low sodium diet education, heart failure management

## 2016-11-16 NOTE — Progress Notes (Signed)
[name], male   DOB: 06/27/1951, 66 y.o.   MRN: [id number]    Cardiology Office Note   Date:  11/24/2016   ID:  BRYLIN STANISLAWSKI, DOB 1951/08/23, MRN [id number]  PCP:  Gildardo Cranker, DO  Cardiologist:  Dr. Jenkins Rouge   Electrophysiologist:  Dr. Virl Axe   Chief Complaint  Patient presents with  . Chronic systolic congestive heart failure (HCC)     History of Present Illness: BRAILEN MACNEAL is a 67 y.o. male with a hx of CAD, s/p CABG 2004, ischemic cardiomyopathy, HTN, HL, HIV, hepatitis C, CKD. Previously followed by Dr. Verl Blalock. Established with me in  10/2013. Cardiac MRI demonstrated EF < 35%. He was referred to Dr. Virl Axe for possible AICD. The patient ultimately declined implantation of ICD. His father died at [facility] after implant  With pneumonia   Seen in ED 01/03/15 with chest pain.  Symptoms were felt to be stable and outpatient workup recommended.  F/U myovue non ischemic  Large inferior and antero apical /lateral scars EF 24%  01/20/15   Studies: - LHC (04/2005): D1 occluded, proximal circumflex 70%, ostial RCA occluded, ramus branch of the circumflex occluded; SVG-ramus patent, SVG-RCA patent - Echo (08/2013): Mild LVH, EF 35-40%, inferior hypokinesis, grade 1 diastolic dysfunction, mild BAE. - Nuclear (01/13/14): Inferior and anterolateral infarct, no ischemia, EF 30%; high-risk   Cardiac MRI 11/2013 1) Moderate LVE with LVH EF 31% with multiple RWMAls as described above Consistent with multi vessel CAD 2) Full thickness scar involving the lateral and inferolateral walls 3) Mild LAE 4) Normal RA/RV  02/2016 Had back surgery with Dr Lorin Mercy for L4-5 decompression and dural tear. No complications   Started on lasix last week for increased weight but Cr now 2.43 Told him to stop it and take PRN  Does not want AICD still  Michelene Heady is aggravating him a lot. Divorced with a child and living with ex wife Not smart with his money and always  asking for help  Past Medical History:  Diagnosis Date  . Arthritis   . Cataracts, bilateral    immature  . CHF (congestive heart failure) (HCC)    not on any meds  . Chronic back pain    stenosis  . Chronic kidney disease, unspecified   . Coronary atherosclerosis of artery bypass graft   . Coronary atherosclerosis of native coronary artery 2005   s/p surgery  . Drug abuse    hx; tested for cocaine as recently as 2/08. says he is not using drugs now - avoided defib. for this reason   . GERD (gastroesophageal reflux disease)    takes OTC meds as needed  . Glaucoma    uses eye drops daily  . Gout, unspecified    takes Allopurinol daily as well as Colchicine-if needed  . Hepatitis 1967   Hep C  . History of colon polyps    benign  . HTN (hypertension)    takes Coreg,Imdur.and Apresoline daily  . Human immunodeficiency virus (HIV) disease    takes Genvoya daily  . Hyperlipidemia    takes Atorvastatin daily  . Ischemic cardiomyopathy   . Muscle spasm    takes Zanaflex as needed  . Nocturia   . Peripheral neuropathy (HCC)    takes gabapentin daily  . Pneumonia    hx of  . Shortness of breath dyspnea    rarely but if notices it then with exertion  . Syphilis, unspecified   .  Type II or unspecified type diabetes mellitus without mention of complication, not stated as uncontrolled 2004   Lantus daily.Average fasting blood sugar 125-199    Past Surgical History:  Procedure Laterality Date  . COLONOSCOPY  2013   Dr.John Henrene Pastor   . CORONARY ARTERY BYPASS GRAFT    . laproscopic cholecystectomy  8/07  . left intertrochanteric hip fracture  2004   s/p intermedullary nail placement 2/08  . left transverse mandibular fracture  10/05  . LUMBAR LAMINECTOMY/DECOMPRESSION MICRODISCECTOMY N/A 02/29/2016   Procedure: Left L4-5 Lateral Recess Decompression, Removal Extradural Intraspinal Facet Cyst;  Surgeon: Marybelle Killings, MD;  Location: [place];  Service: Orthopedics;  Laterality: N/A;       Current Outpatient Prescriptions  Medication Sig Dispense Refill  . allopurinol (ZYLOPRIM) 100 MG tablet Take 1 tablet (100 mg total) by mouth daily. 30 tablet 3  . ANORO ELLIPTA 62.5-25 MCG/INH AEPB INHALE 1 PUFF INTO THE LUNGS DAILY 60 each 5  . aspirin EC 81 MG tablet Take 81 mg by mouth daily.    [name] atorvastatin (LIPITOR) 20 MG tablet Take 1 tablet (20 mg total) by mouth daily. 30 tablet 3  . B-D UF III MINI PEN NEEDLES 31G X 5 MM MISC USE 4 TIMES DAILY 150 each 11  . carvedilol (COREG) 25 MG tablet take 1 tablet by mouth twice a day with meals 180 tablet 1  . Choline Fenofibrate 135 MG capsule Take 1 capsule (135 mg total) by mouth daily. 30 capsule 6  . colchicine 0.6 MG tablet Take 0.6 mg by mouth daily as needed (gout). Reported on 04/04/2016    . elvitegravir-cobicistat-emtricitabine-tenofovir (GENVOYA) 150-150-200-10 MG TABS tablet TAKE 1 TABLET BY MOUTH DAILY WITH BREAKFAST. 30 tablet 11  . furosemide (LASIX) 40 MG tablet Take 1 tablet (40 mg total) by mouth as needed for fluid or edema.    . gabapentin (NEURONTIN) 300 MG capsule take 1 capsule by mouth three times a day 90 capsule 1  . Glucosamine-Chondroit-Vit C-Mn (GLUCOSAMINE CHONDR 1500 COMPLX PO) Take 2 tablets by mouth daily. Reported on 04/04/2016    . hydrALAZINE (APRESOLINE) 25 MG tablet take 1 tablet by mouth three times a day 90 tablet 8  . insulin lispro (HUMALOG KWIKPEN) 100 UNIT/ML KiwkPen Inject 12 Units into the skin 3 (three) times daily.    . isosorbide mononitrate (IMDUR) 30 MG 24 hr tablet Take 30 mg by mouth daily. Reported on 04/04/2016    . LANTUS SOLOSTAR 100 UNIT/ML Solostar Pen Inject 70 Units into the skin every morning. 35 units on each side  0  . latanoprost (XALATAN) 0.005 % ophthalmic solution Place 1 drop into both eyes at bedtime. Reported on 04/04/2016    . Naftifine HCl 2 % CREA apply to affected area twice a day 60 g 11  . nitroGLYCERIN (NITROSTAT) 0.3 MG SL tablet Place 1 tablet (0.3 mg total)  under the tongue as needed for chest pain (not to exceed 3 tablets in one day.). 25 tablet 2  . omeprazole (PRILOSEC) 20 MG capsule take 1 capsule by mouth twice a day before meals 60 capsule 5  . ONE TOUCH LANCETS MISC Check BS twice daily. 200 each 3  . ONE TOUCH ULTRA TEST test strip CHECK BLOOD SUGAR TWO TIMES DAILY AS DIRECTED 200 each 6   No current facility-administered medications for this visit.     Allergies:   Amphetamines    Social History:  The patient  reports that he has been  smoking Cigarettes.  He has a 20.50 pack-year smoking history. He has never used smokeless tobacco. He reports that he drinks about 3.6 oz of alcohol per week . He reports that he does not use drugs.   Family History:  The patient's family history includes Alzheimer's disease in his mother and sister; Diabetes in his brother, brother, and sister; Drug abuse in his brother; Heart attack in his brother; Heart failure in his father; Hypertension in his brother and father; Stroke in his sister.    ROS:   Please see the history of present illness.   Review of Systems  Constitution: Positive for diaphoresis.  Cardiovascular: Positive for chest pain and dyspnea on exertion.  Respiratory: Positive for cough.   Musculoskeletal: Positive for myalgias.  All other systems reviewed and are negative.    PHYSICAL EXAM: VS:  BP 112/60   Pulse 71   Ht 6\' 2"  (1.88 m)   Wt 188 lb 9.6 oz (85.5 kg)   SpO2 95%   BMI 24.21 kg/m     Wt Readings from Last 3 Encounters:  11/24/16 188 lb 9.6 oz (85.5 kg)  11/21/16 189 lb 6.4 oz (85.9 kg)  11/18/16 189 lb 6.4 oz (85.9 kg)     GEN: Well nourished, well developed, in no acute distress  HEENT: normal  Neck: no JVD, no masses Cardiac:  Normal S1/S2, RRR; no murmur ,  no rubs or gallops, no edema  Respiratory:  clear to auscultation bilaterally, no wheezing, rhonchi or rales. GI: soft, nontender, nondistended, + BS MS: no deformity or atrophy  Skin: warm and dry    Neuro:  CNs II-XII intact, Strength and sensation are intact Psych: Normal affect   EKG:  March 2016    NSR, HR 81, inf Q waves, TWI in 1, aVL, V4-6, no changes   Recent Labs: 11/10/2016: B Natriuretic Peptide 740.5; TSH 0.540 11/11/2016: ALT 24 11/18/2016: BUN 31; Creat 2.43; Hemoglobin 10.2; Platelets 304; Potassium 5.4; Sodium 139    Lipid Panel    Component Value Date/Time   CHOL 162 08/18/2016 0850   CHOL 121 08/07/2015 1041   TRIG 165 (H) 08/18/2016 0850   HDL 39 (L) 08/18/2016 0850   HDL 32 (L) 08/07/2015 1041   CHOLHDL 4.2 08/18/2016 0850   VLDL 33 (H) 08/18/2016 0850   LDLCALC 90 08/18/2016 0850   LDLCALC 15 08/07/2015 1041      ASSESSMENT AND PLAN:  Other chest pain Atypical f/u myovue non ischemic continue medical Rx   Coronary artery disease involving native coronary artery of native heart without angina pectoris  Continue aspirin, statin, beta blocker, ACE inhibitor.  Ischemic cardiomyopathy He has refused ICD implantation in  past. Continue beta blocker, ACE inhibitor, hydralazine. We had a long discussion about maximizing his CHF medications. He does not take any PDE-5 inhibitors.Tolerating nitrates Take Lasix PRN recheck BMET next week with Dr [name]   Chronic combined systolic and diastolic congestive heart failure Volume stable. He is NYHA 2.  Essential hypertension Controlled.  HLD (hyperlipidemia)  Continue statin.  Ortho:  Post back surgery May 2017 f/u Dr Lorin Mercy much relief   Current medicines are reviewed at length with the patient today.  The patient does not have concerns regarding medicines.  The following changes have been made:  As above   Labs/ tests ordered today include:   No orders of the defined types were placed in this encounter.   Disposition:   FU with me in 44months  Jenkins Rouge

## 2016-11-18 ENCOUNTER — Encounter: Payer: Self-pay | Admitting: Internal Medicine

## 2016-11-18 ENCOUNTER — Ambulatory Visit (INDEPENDENT_AMBULATORY_CARE_PROVIDER_SITE_OTHER): Payer: Medicare Other | Admitting: Internal Medicine

## 2016-11-18 VITALS — BP 132/82 | HR 64 | Temp 98.1°F | Ht 74.0 in | Wt 189.4 lb

## 2016-11-18 DIAGNOSIS — N183 Chronic kidney disease, stage 3 unspecified: Secondary | ICD-10-CM

## 2016-11-18 DIAGNOSIS — I5042 Chronic combined systolic (congestive) and diastolic (congestive) heart failure: Secondary | ICD-10-CM | POA: Diagnosis not present

## 2016-11-18 DIAGNOSIS — I10 Essential (primary) hypertension: Secondary | ICD-10-CM | POA: Diagnosis not present

## 2016-11-18 DIAGNOSIS — E1140 Type 2 diabetes mellitus with diabetic neuropathy, unspecified: Secondary | ICD-10-CM

## 2016-11-18 DIAGNOSIS — B20 Human immunodeficiency virus [HIV] disease: Secondary | ICD-10-CM

## 2016-11-18 DIAGNOSIS — D638 Anemia in other chronic diseases classified elsewhere: Secondary | ICD-10-CM

## 2016-11-18 DIAGNOSIS — Z794 Long term (current) use of insulin: Secondary | ICD-10-CM

## 2016-11-18 DIAGNOSIS — E782 Mixed hyperlipidemia: Secondary | ICD-10-CM

## 2016-11-18 DIAGNOSIS — Z716 Tobacco abuse counseling: Secondary | ICD-10-CM

## 2016-11-18 DIAGNOSIS — I255 Ischemic cardiomyopathy: Secondary | ICD-10-CM

## 2016-11-18 LAB — CBC WITH DIFFERENTIAL/PLATELET
BASOS ABS: 57 {cells}/uL (ref 0–200)
Basophils Relative: 1 %
EOS ABS: 342 {cells}/uL (ref 15–500)
Eosinophils Relative: 6 %
HEMATOCRIT: 33.1 % — AB (ref 38.5–50.0)
Hemoglobin: 10.2 g/dL — ABNORMAL LOW (ref 13.2–17.1)
LYMPHS PCT: 51 %
Lymphs Abs: 2907 cells/uL (ref 850–3900)
MCH: 27.9 pg (ref 27.0–33.0)
MCHC: 30.8 g/dL — ABNORMAL LOW (ref 32.0–36.0)
MCV: 90.7 fL (ref 80.0–100.0)
MONO ABS: 513 {cells}/uL (ref 200–950)
MONOS PCT: 9 %
MPV: 10.6 fL (ref 7.5–12.5)
Neutro Abs: 1881 cells/uL (ref 1500–7800)
Neutrophils Relative %: 33 %
PLATELETS: 304 10*3/uL (ref 140–400)
RBC: 3.65 MIL/uL — ABNORMAL LOW (ref 4.20–5.80)
RDW: 14.8 % (ref 11.0–15.0)
WBC: 5.7 10*3/uL (ref 3.8–10.8)

## 2016-11-18 NOTE — Progress Notes (Signed)
[name], male   DOB: October 06, 1951, 67 y.o.   MRN: [id number]    Location:  [facility] Place of Service: OFFICE  Chief Complaint  Patient presents with  . Hospitalization Follow-up    TOC    HPI:  66 yo male seen today for hospital f/u. He was seen in the ED on 10/20/16 (fell at home and sustained scalp laceration with LOC and amnestic to event), 10/21/16, 10/22/16, 10/22/16. He saw podiatry on 10/25/16 and glass fragments removed from right foot. He was admitted to [facility] 11/10/16-11/11/16 for acute hypoxic respiratory failure 2/2 acute/chronic combined HF, possible COPD with hx tobacco abuse, HTN, CKD stage 3, DM, AOCD, HIV, gout. Cr 1.95; albumin 2.9; A1c 6.2%; Hgb 9.7  Today he reports feeling better. Left side no longer swollen. SOB improved. Appetite is excellent. He has increased urinary frequency 2/2 to lasix. No change in bowel habits. He is schedule to see cardio Dr [name] on Feb 1st. Weight at home 182.4lbs this AM (GOAL is 183 lb)  tob abuse - still smoking but down to 1/2 ppd  HIV - stable on HAART tx. HIV1 RNA quant <20; log <1.3. CD4 abs 1330. Followed by ID Dr [name]  HTN - BP stable on hydralazine, coreg, imdur. Off ARB since previous hospital d/c   DM - BS improved with 1/2 dose lantus on each side of abdomen.  No low BS reactions. Numbness/tingling in feet. Takes Lantus  insulin 70 units qhs and humalog 12 units qAC . Followed by podiatry. No ulcerations but has calluses. A1c 6.2%. He is not on ARB due to AKI and hyperkalemia. He has an eye specialist  Hyperlipidemia - TG 165; LDL 90. He is currently on lipitor 65m qhs. He likes fried fish and eats it once a week. He also likes cookies and starbursts and eats french fries several times a week.    Past Medical History:  Diagnosis Date  . Arthritis   . Cataracts, bilateral    immature  . CHF (congestive heart failure) (HCC)    not on any meds  . Chronic back pain    stenosis  . Chronic kidney disease,  unspecified   . Coronary atherosclerosis of artery bypass graft   . Coronary atherosclerosis of native coronary artery 2005   s/p surgery  . Drug abuse    hx; tested for cocaine as recently as 2/08. says he is not using drugs now - avoided defib. for this reason   . GERD (gastroesophageal reflux disease)    takes OTC meds as needed  . Glaucoma    uses eye drops daily  . Gout, unspecified    takes Allopurinol daily as well as Colchicine-if needed  . Hepatitis 1967   Hep C  . History of colon polyps    benign  . HTN (hypertension)    takes Coreg,Imdur.and Apresoline daily  . Human immunodeficiency virus (HIV) disease    takes Genvoya daily  . Hyperlipidemia    takes Atorvastatin daily  . Ischemic cardiomyopathy   . Muscle spasm    takes Zanaflex as needed  . Nocturia   . Peripheral neuropathy (HCC)    takes gabapentin daily  . Pneumonia    hx of  . Shortness of breath dyspnea    rarely but if notices it then with exertion  . Syphilis, unspecified   . Type II or unspecified type diabetes mellitus without mention of complication, not stated as uncontrolled 2004   Lantus daily.Average  fasting blood sugar 125-199    Past Surgical History:  Procedure Laterality Date  . COLONOSCOPY  2013   Dr.John Henrene Pastor   . CORONARY ARTERY BYPASS GRAFT    . laproscopic cholecystectomy  8/07  . left intertrochanteric hip fracture  2004   s/p intermedullary nail placement 2/08  . left transverse mandibular fracture  10/05  . LUMBAR LAMINECTOMY/DECOMPRESSION MICRODISCECTOMY N/A 02/29/2016   Procedure: Left L4-5 Lateral Recess Decompression, Removal Extradural Intraspinal Facet Cyst;  Surgeon: Marybelle Killings, MD;  Location: [place];  Service: Orthopedics;  Laterality: N/A;    Patient Care Team: Gildardo Cranker, DO as PCP - General (Internal Medicine) [name], MD as PCP - Infectious Diseases (Infectious Diseases) [name], MD as Consulting Physician (Ophthalmology) [name], MD as  Consulting Physician (Cardiology) [name], MD as Consulting Physician (Infectious Diseases) Gardiner Barefoot, DPM as Consulting Physician (Podiatry) Tobi Bastos, RN as [place] [facility]  Social History   Social History  . Marital status: Single    Spouse name: N/A  . Number of children: 1  . Years of education: N/A   Occupational History  . retired   . part time- church    Social History Main Topics  . Smoking status: Current Every Day Smoker    Packs/day: 0.50    Years: 41.00    Types: Cigarettes  . Smokeless tobacco: Never Used  . Alcohol use 3.6 oz/week    6 Standard drinks or equivalent per week     Comment: occ.  . Drug use: No     Comment: hx of crack/cocaine 42yr ago  . Sexual activity: Not Currently     Comment: declined condoms   Other Topics Concern  . Not on file   Social History Narrative   Retired.       As of 05/2015:   Diet: No salt    Caffeine   Married: Single    House: Condo, 2 stories, 1 person (self)    Pets: No    Current/Past profession: N/A   Exercise: walks daily    Living Will: Yes    DNR   POA/HPOA: No            reports that he has been smoking Cigarettes.  He has a 20.50 pack-year smoking history. He has never used smokeless tobacco. He reports that he drinks about 3.6 oz of alcohol per week . He reports that he does not use drugs.  Family History  Problem Relation Age of Onset  . Heart failure Father   . Hypertension Father   . Diabetes Brother   . Heart attack Brother   . Alzheimer's disease Mother   . Stroke Sister   . Diabetes Sister   . Alzheimer's disease Sister   . Hypertension Brother   . Diabetes Brother   . Drug abuse Brother   . Colon cancer Neg Hx    Family Status  Relation Status  . Father Deceased   heart failure  . Brother Alive  . Brother Alive  . Mother Deceased  . Sister Alive  . Brother Deceased   MI; also did cocaine   . Sister Alive  . Sister Alive  .  Brother   . Brother   . Neg Hx      Allergies  Allergen Reactions  . Amphetamines Other (See Comments)    Pt is unaware of this -     Medications: Patient's Medications  New Prescriptions  No medications on file  Previous Medications   ALLOPURINOL (ZYLOPRIM) 100 MG TABLET    Take 1 tablet (100 mg total) by mouth daily.   ASPIRIN EC 81 MG TABLET    Take 81 mg by mouth daily.   ATORVASTATIN (LIPITOR) 20 MG TABLET    Take 1 tablet (20 mg total) by mouth daily.   B-D UF III MINI PEN NEEDLES 31G X 5 MM MISC    USE 4 TIMES DAILY   CARVEDILOL (COREG) 25 MG TABLET    Take 1 tablet (25 mg total) by mouth 2 (two) times daily with a meal.   CHOLINE FENOFIBRATE 135 MG CAPSULE    Take 1 capsule (135 mg total) by mouth daily.   COLCHICINE 0.6 MG TABLET    Take 0.6 mg by mouth daily as needed (gout). Reported on 04/04/2016   ELVITEGRAVIR-COBICISTAT-EMTRICITABINE-TENOFOVIR (GENVOYA) 150-150-200-10 MG TABS TABLET    TAKE 1 TABLET BY MOUTH DAILY WITH BREAKFAST.   FUROSEMIDE (LASIX) 40 MG TABLET    Take 1 tablet (40 mg total) by mouth 2 (two) times daily.   GABAPENTIN (NEURONTIN) 300 MG CAPSULE    take 1 capsule by mouth three times a day   GLUCOSAMINE-CHONDROIT-VIT C-MN (GLUCOSAMINE CHONDR 1500 COMPLX PO)    Take 2 tablets by mouth daily. Reported on 04/04/2016   HYDRALAZINE (APRESOLINE) 25 MG TABLET    take 1 tablet by mouth three times a day   INSULIN LISPRO (HUMALOG KWIKPEN) 100 UNIT/ML KIWKPEN    Inject 0.09 mLs (9 Units total) into the skin 3 (three) times daily. Before Meals DX E11.22   ISOSORBIDE MONONITRATE (IMDUR) 30 MG 24 HR TABLET    Take 30 mg by mouth daily. Reported on 04/04/2016   LANTUS SOLOSTAR 100 UNIT/ML SOLOSTAR PEN    Inject 70 Units into the skin every morning. 35 units on each side   LATANOPROST (XALATAN) 0.005 % OPHTHALMIC SOLUTION    Place 1 drop into both eyes at bedtime. Reported on 04/04/2016   NAFTIFINE HCL 2 % CREA    apply to affected area twice a day   NITROGLYCERIN  (NITROSTAT) 0.3 MG SL TABLET    Place 1 tablet (0.3 mg total) under the tongue as needed for chest pain (not to exceed 3 tablets in one day.).   OMEPRAZOLE (PRILOSEC) 20 MG CAPSULE    Take 1 capsule (20 mg total) by mouth 2 (two) times daily before a meal.   ONDANSETRON (ZOFRAN ODT) 4 MG DISINTEGRATING TABLET    Take 1 tablet (4 mg total) by mouth every 8 (eight) hours as needed for nausea or vomiting.   ONE TOUCH LANCETS MISC    Check BS twice daily.   ONE TOUCH ULTRA TEST TEST STRIP    CHECK BLOOD SUGAR TWO TIMES DAILY AS DIRECTED   UMECLIDINIUM-VILANTEROL (ANORO ELLIPTA) 62.5-25 MCG/INH AEPB    Inhale 1 puff into the lungs daily.  Modified Medications   No medications on file  Discontinued Medications   No medications on file    Review of Systems  Genitourinary: Positive for frequency.  Musculoskeletal: Positive for arthralgias.  Neurological: Positive for numbness.  All other systems reviewed and are negative.   Vitals:   11/18/16 1057  BP: 132/82  Pulse: 64  Temp: 98.1 F (36.7 C)  TempSrc: Oral  SpO2: 98%  Weight: 189 lb 6.4 oz (85.9 kg)  Height: _0  (1.88 m)   Body mass index is 24.32 kg/m.  Physical Exam  Constitutional: He is  oriented to person, place, and time. He appears well-developed and well-nourished.  HENT:  Mouth/Throat: Oropharynx is clear and moist.  No gum swelling/ d/c noted  Eyes: Pupils are equal, round, and reactive to light. No scleral icterus.  Neck: Neck supple. Carotid bruit is not present. No thyromegaly present.  Cardiovascular: Normal rate, regular rhythm, normal heart sounds and intact distal pulses.  Exam reveals no gallop and no friction rub.   No murmur heard. no distal LE swelling. No calf TTP  Pulmonary/Chest: Effort normal and breath sounds normal. He has no wheezes. He has no rales. He exhibits no tenderness.  Abdominal: Soft. Bowel sounds are normal. He exhibits distension. He exhibits no abdominal bruit, no pulsatile midline mass  and no mass. There is no tenderness. There is no rebound and no guarding.  Musculoskeletal: He exhibits edema and tenderness.  Lymphadenopathy:    He has no cervical adenopathy.  Neurological: He is alert and oriented to person, place, and time.  No gross neurologic deficits; CN 2-12 grossly intact  Skin: Skin is warm and dry. No rash noted.  Psychiatric: He has a normal mood and affect. His behavior is normal. Judgment and thought content normal.     Labs reviewed: Admission on 11/10/2016, Discharged on 11/11/2016  Component Date Value Ref Range Status  . WBC 11/10/2016 8.1  4.0 - 10.5 K/uL Final  . RBC 11/10/2016 3.46* 4.22 - 5.81 MIL/uL Final  . Hemoglobin 11/10/2016 10.0* 13.0 - 17.0 g/dL Final  . HCT 11/10/2016 31.6* 39.0 - 52.0 % Final  . MCV 11/10/2016 91.3  78.0 - 100.0 fL Final  . MCH 11/10/2016 28.9  26.0 - 34.0 pg Final  . MCHC 11/10/2016 31.6  30.0 - 36.0 g/dL Final  . RDW 11/10/2016 15.5  11.5 - 15.5 % Final  . Platelets 11/10/2016 313  150 - 400 K/uL Final  . Neutrophils Relative % 11/10/2016 54  % Final  . Neutro Abs 11/10/2016 4.5  1.7 - 7.7 K/uL Final  . Lymphocytes Relative 11/10/2016 34  % Final  . Lymphs Abs 11/10/2016 2.7  0.7 - 4.0 K/uL Final  . Monocytes Relative 11/10/2016 7  % Final  . Monocytes Absolute 11/10/2016 0.5  0.1 - 1.0 K/uL Final  . Eosinophils Relative 11/10/2016 4  % Final  . Eosinophils Absolute 11/10/2016 0.3  0.0 - 0.7 K/uL Final  . Basophils Relative 11/10/2016 1  % Final  . Basophils Absolute 11/10/2016 0.0  0.0 - 0.1 K/uL Final  . Sodium 11/10/2016 139  135 - 145 mmol/L Final  . Potassium 11/10/2016 4.0  3.5 - 5.1 mmol/L Final  . Chloride 11/10/2016 108  101 - 111 mmol/L Final  . CO2 11/10/2016 22  22 - 32 mmol/L Final  . Glucose, Bld 11/10/2016 158* 65 - 99 mg/dL Final  . BUN 11/10/2016 24* 6 - 20 mg/dL Final  . Creatinine, Ser 11/10/2016 1.88* 0.61 - 1.24 mg/dL Final  . Calcium 11/10/2016 8.8* 8.9 - 10.3 mg/dL Final  . Total  Protein 11/10/2016 7.5  6.5 - 8.1 g/dL Final  . Albumin 11/10/2016 3.1* 3.5 - 5.0 g/dL Final  . AST 11/10/2016 30  15 - 41 U/L Final  . ALT 11/10/2016 29  17 - 63 U/L Final  . Alkaline Phosphatase 11/10/2016 68  38 - 126 U/L Final  . Total Bilirubin 11/10/2016 0.6  0.3 - 1.2 mg/dL Final  . GFR calc non Af Amer 11/10/2016 35* >60 mL/min Final  . GFR calc Af Wyvonnia Lora 11/10/2016  41* >60 mL/min Final   Comment: (NOTE) The eGFR has been calculated using the CKD EPI equation. This calculation has not been validated in all clinical situations. eGFR's persistently <60 mL/min signify possible Chronic Kidney Disease.   . Anion gap 11/10/2016 9  5 - 15 Final  . B Natriuretic Peptide 11/10/2016 740.5* 0.0 - 100.0 pg/mL Final  . pH, Arterial 11/10/2016 7.394  7.350 - 7.450 Final  . pCO2 arterial 11/10/2016 34.6  32.0 - 48.0 mmHg Final  . pO2, Arterial 11/10/2016 51.0* 83.0 - 108.0 mmHg Final  . Bicarbonate 11/10/2016 21.3  20.0 - 28.0 mmol/L Final  . TCO2 11/10/2016 22  0 - 100 mmol/L Final  . O2 Saturation 11/10/2016 87.0  % Final  . Acid-base deficit 11/10/2016 3.0* 0.0 - 2.0 mmol/L Final  . Patient temperature 11/10/2016 97.3 F   Final  . Collection site 11/10/2016 RADIAL, ALLEN'S TEST ACCEPTABLE   Final  . Drawn by 11/10/2016 RT   Final  . Sample type 11/10/2016 ARTERIAL   Final  . Troponin i, poc 11/10/2016 0.01  0.00 - 0.08 ng/mL Final  . Comment 3 11/10/2016          Final   Comment: Due to the release kinetics of cTnI, a negative result within the first hours of the onset of symptoms does not rule out myocardial infarction with certainty. If myocardial infarction is still suspected, repeat the test at appropriate intervals.   . Hgb A1c MFr Bld 11/10/2016 6.2* 4.8 - 5.6 % Final   Comment: (NOTE)         Pre-diabetes: 5.7 - 6.4         Diabetes: >6.4         Glycemic control for adults with diabetes: <7.0   . Mean Plasma Glucose 11/10/2016 131  mg/dL Final   Comment: (NOTE) Performed  At: [facility] [place], [place] [id number] Lindon Romp MD [id number]   . Weight 11/10/2016 3051.2  oz Final  . Height 11/10/2016 74  in Final  . BP 11/10/2016 183/82  mmHg Final  . Lactic Acid, Venous 11/10/2016 0.9  0.5 - 1.9 mmol/L Final  . TSH 11/10/2016 0.540  0.350 - 4.500 uIU/mL Final  . Troponin I 11/10/2016 0.03* <0.03 ng/mL Final   Comment: CRITICAL RESULT CALLED TO, READ BACK BY AND VERIFIED WITH: L.MUELLER,RN 0920 11/10/16 CLARK,S   . Troponin I 11/10/2016 0.03* <0.03 ng/mL Final  . Troponin I 11/10/2016 0.03* <0.03 ng/mL Final  . Color, Urine 11/10/2016 COLORLESS* YELLOW Final  . APPearance 11/10/2016 CLEAR  CLEAR Final  . Specific Gravity, Urine 11/10/2016 1.004* 1.005 - 1.030 Final  . pH 11/10/2016 7.0  5.0 - 8.0 Final  . Glucose, UA 11/10/2016 NEGATIVE  NEGATIVE mg/dL Final  . Hgb urine dipstick 11/10/2016 NEGATIVE  NEGATIVE Final  . Bilirubin Urine 11/10/2016 NEGATIVE  NEGATIVE Final  . Ketones, ur 11/10/2016 NEGATIVE  NEGATIVE mg/dL Final  . Protein, ur 11/10/2016 NEGATIVE  NEGATIVE mg/dL Final  . Nitrite 11/10/2016 NEGATIVE  NEGATIVE Final  . Leukocytes, UA 11/10/2016 NEGATIVE  NEGATIVE Final  . Glucose-Capillary 11/10/2016 121* 65 - 99 mg/dL Final  . Comment 1 11/10/2016 Notify RN   Final  . Glucose-Capillary 11/10/2016 227* 65 - 99 mg/dL Final  . Comment 1 11/10/2016 Notify RN   Final  . Glucose-Capillary 11/10/2016 144* 65 - 99 mg/dL Final  . Comment 1 11/10/2016 Notify RN   Final  . WBC 11/11/2016 5.5  4.0 - 10.5  K/uL Final  . RBC 11/11/2016 3.38* 4.22 - 5.81 MIL/uL Final  . Hemoglobin 11/11/2016 9.7* 13.0 - 17.0 g/dL Final  . HCT 11/11/2016 30.2* 39.0 - 52.0 % Final  . MCV 11/11/2016 89.3  78.0 - 100.0 fL Final  . MCH 11/11/2016 28.7  26.0 - 34.0 pg Final  . MCHC 11/11/2016 32.1  30.0 - 36.0 g/dL Final  . RDW 11/11/2016 15.4  11.5 - 15.5 % Final  . Platelets 11/11/2016 276  150 - 400 K/uL Final  . Neutrophils Relative %  11/11/2016 32  % Final  . Neutro Abs 11/11/2016 1.8  1.7 - 7.7 K/uL Final  . Lymphocytes Relative 11/11/2016 52  % Final  . Lymphs Abs 11/11/2016 2.9  0.7 - 4.0 K/uL Final  . Monocytes Relative 11/11/2016 10  % Final  . Monocytes Absolute 11/11/2016 0.5  0.1 - 1.0 K/uL Final  . Eosinophils Relative 11/11/2016 5  % Final  . Eosinophils Absolute 11/11/2016 0.3  0.0 - 0.7 K/uL Final  . Basophils Relative 11/11/2016 1  % Final  . Basophils Absolute 11/11/2016 0.1  0.0 - 0.1 K/uL Final  . Sodium 11/11/2016 138  135 - 145 mmol/L Final  . Potassium 11/11/2016 3.9  3.5 - 5.1 mmol/L Final  . Chloride 11/11/2016 108  101 - 111 mmol/L Final  . CO2 11/11/2016 25  22 - 32 mmol/L Final  . Glucose, Bld 11/11/2016 87  65 - 99 mg/dL Final  . BUN 11/11/2016 26* 6 - 20 mg/dL Final  . Creatinine, Ser 11/11/2016 1.95* 0.61 - 1.24 mg/dL Final  . Calcium 11/11/2016 9.0  8.9 - 10.3 mg/dL Final  . Total Protein 11/11/2016 7.2  6.5 - 8.1 g/dL Final  . Albumin 11/11/2016 2.9* 3.5 - 5.0 g/dL Final  . AST 11/11/2016 24  15 - 41 U/L Final  . ALT 11/11/2016 24  17 - 63 U/L Final  . Alkaline Phosphatase 11/11/2016 65  38 - 126 U/L Final  . Total Bilirubin 11/11/2016 0.5  0.3 - 1.2 mg/dL Final  . GFR calc non Af Amer 11/11/2016 34* >60 mL/min Final  . GFR calc Af Amer 11/11/2016 39* >60 mL/min Final   Comment: (NOTE) The eGFR has been calculated using the CKD EPI equation. This calculation has not been validated in all clinical situations. eGFR's persistently <60 mL/min signify possible Chronic Kidney Disease.   . Anion gap 11/11/2016 5  5 - 15 Final  . Glucose-Capillary 11/10/2016 218* 65 - 99 mg/dL Final  . Glucose-Capillary 11/11/2016 87  65 - 99 mg/dL Final  . Glucose-Capillary 11/11/2016 131* 65 - 99 mg/dL Final  Admission on 10/22/2016, Discharged on 10/23/2016  Component Date Value Ref Range Status  . Glucose-Capillary 10/22/2016 287* 65 - 99 mg/dL Final  . WBC 10/23/2016 7.4  4.0 - 10.5 K/uL Final  .  RBC 10/23/2016 4.57  4.22 - 5.81 MIL/uL Final  . Hemoglobin 10/23/2016 13.6  13.0 - 17.0 g/dL Final  . HCT 10/23/2016 40.3  39.0 - 52.0 % Final  . MCV 10/23/2016 88.2  78.0 - 100.0 fL Final  . MCH 10/23/2016 29.8  26.0 - 34.0 pg Final  . MCHC 10/23/2016 33.7  30.0 - 36.0 g/dL Final  . RDW 10/23/2016 13.3  11.5 - 15.5 % Final  . Platelets 10/23/2016 174  150 - 400 K/uL Final  . Neutrophils Relative % 10/23/2016 65  % Final  . Neutro Abs 10/23/2016 4.8  1.7 - 7.7 K/uL Final  . Lymphocytes Relative 10/23/2016  25  % Final  . Lymphs Abs 10/23/2016 1.9  0.7 - 4.0 K/uL Final  . Monocytes Relative 10/23/2016 8  % Final  . Monocytes Absolute 10/23/2016 0.6  0.1 - 1.0 K/uL Final  . Eosinophils Relative 10/23/2016 1  % Final  . Eosinophils Absolute 10/23/2016 0.1  0.0 - 0.7 K/uL Final  . Basophils Relative 10/23/2016 1  % Final  . Basophils Absolute 10/23/2016 0.1  0.0 - 0.1 K/uL Final  . Sodium 10/23/2016 135  135 - 145 mmol/L Final  . Potassium 10/23/2016 4.2  3.5 - 5.1 mmol/L Final  . Chloride 10/23/2016 100* 101 - 111 mmol/L Final  . CO2 10/23/2016 23  22 - 32 mmol/L Final  . Glucose, Bld 10/23/2016 285* 65 - 99 mg/dL Final  . BUN 10/23/2016 19  6 - 20 mg/dL Final  . Creatinine, Ser 10/23/2016 1.71* 0.61 - 1.24 mg/dL Final  . Calcium 10/23/2016 8.9  8.9 - 10.3 mg/dL Final  . Total Protein 10/23/2016 7.9  6.5 - 8.1 g/dL Final  . Albumin 10/23/2016 3.4* 3.5 - 5.0 g/dL Final  . AST 10/23/2016 53* 15 - 41 U/L Final  . ALT 10/23/2016 39  17 - 63 U/L Final  . Alkaline Phosphatase 10/23/2016 80  38 - 126 U/L Final  . Total Bilirubin 10/23/2016 0.7  0.3 - 1.2 mg/dL Final  . GFR calc non Af Amer 10/23/2016 40* >60 mL/min Final  . GFR calc Af Amer 10/23/2016 46* >60 mL/min Final   Comment: (NOTE) The eGFR has been calculated using the CKD EPI equation. This calculation has not been validated in all clinical situations. eGFR's persistently <60 mL/min signify possible Chronic Kidney Disease.   .  Anion gap 10/23/2016 12  5 - 15 Final  . Lipase 10/23/2016 52* 11 - 51 U/L Final  Admission on 10/21/2016, Discharged on 10/22/2016  Component Date Value Ref Range Status  . Sodium 10/21/2016 139  135 - 145 mmol/L Final  . Potassium 10/21/2016 4.4  3.5 - 5.1 mmol/L Final  . Chloride 10/21/2016 102  101 - 111 mmol/L Final  . CO2 10/21/2016 20* 22 - 32 mmol/L Final  . Glucose, Bld 10/21/2016 277* 65 - 99 mg/dL Final  . BUN 10/21/2016 27* 6 - 20 mg/dL Final  . Creatinine, Ser 10/21/2016 2.02* 0.61 - 1.24 mg/dL Final  . Calcium 10/21/2016 8.8* 8.9 - 10.3 mg/dL Final  . GFR calc non Af Amer 10/21/2016 32* >60 mL/min Final  . GFR calc Af Amer 10/21/2016 38* >60 mL/min Final   Comment: (NOTE) The eGFR has been calculated using the CKD EPI equation. This calculation has not been validated in all clinical situations. eGFR's persistently <60 mL/min signify possible Chronic Kidney Disease.   . Anion gap 10/21/2016 17* 5 - 15 Final  . WBC 10/21/2016 7.2  4.0 - 10.5 K/uL Final  . RBC 10/21/2016 4.84  4.22 - 5.81 MIL/uL Final  . Hemoglobin 10/21/2016 14.7  13.0 - 17.0 g/dL Final  . HCT 10/21/2016 42.9  39.0 - 52.0 % Final  . MCV 10/21/2016 88.6  78.0 - 100.0 fL Final  . MCH 10/21/2016 30.4  26.0 - 34.0 pg Final  . MCHC 10/21/2016 34.3  30.0 - 36.0 g/dL Final  . RDW 10/21/2016 13.5  11.5 - 15.5 % Final  . Platelets 10/21/2016 219  150 - 400 K/uL Final  . Alcohol, Ethyl (B) 10/21/2016 353* <5 mg/dL Final   Comment:        LOWEST DETECTABLE LIMIT  FOR SERUM ALCOHOL IS 5 mg/dL FOR MEDICAL PURPOSES ONLY CRITICAL RESULT CALLED TO, READ BACK BY AND VERIFIED WITH: C.[facility] 2219 10/21/16 G.MCADOO   . Troponin I 10/21/2016 0.05* <0.03 ng/mL Final   Comment: CRITICAL RESULT CALLED TO, READ BACK BY AND VERIFIED WITH: C.SANGALAND,RN 2223 10/21/16 G.MCADOO   . Color, Urine 10/21/2016 YELLOW  YELLOW Final  . APPearance 10/21/2016 CLEAR  CLEAR Final  . Specific Gravity, Urine 10/21/2016 1.020   1.005 - 1.030 Final  . pH 10/21/2016 5.0  5.0 - 8.0 Final  . Glucose, UA 10/21/2016 150* NEGATIVE mg/dL Final  . Hgb urine dipstick 10/21/2016 MODERATE* NEGATIVE Final  . Bilirubin Urine 10/21/2016 NEGATIVE  NEGATIVE Final  . Ketones, ur 10/21/2016 NEGATIVE  NEGATIVE mg/dL Final  . Protein, ur 10/21/2016 >=300* NEGATIVE mg/dL Final  . Nitrite 10/21/2016 NEGATIVE  NEGATIVE Final  . Leukocytes, UA 10/21/2016 NEGATIVE  NEGATIVE Final  . RBC / HPF 10/21/2016 0-5  0 - 5 RBC/hpf Final  . WBC, UA 10/21/2016 0-5  0 - 5 WBC/hpf Final  . Bacteria, UA 10/21/2016 NONE SEEN  NONE SEEN Final  . Squamous Epithelial / LPF 10/21/2016 NONE SEEN  NONE SEEN Final  . Mucous 10/21/2016 PRESENT   Final  . Hyaline Casts, UA 10/21/2016 PRESENT   Final  . Granular Casts, UA 10/21/2016 PRESENT   Final  . Opiates 10/21/2016 POSITIVE* NONE DETECTED Final  . Cocaine 10/21/2016 POSITIVE* NONE DETECTED Final  . Benzodiazepines 10/21/2016 NONE DETECTED  NONE DETECTED Final  . Amphetamines 10/21/2016 NONE DETECTED  NONE DETECTED Final  . Tetrahydrocannabinol 10/21/2016 NONE DETECTED  NONE DETECTED Final  . Barbiturates 10/21/2016 NONE DETECTED  NONE DETECTED Final   Comment:        DRUG SCREEN FOR MEDICAL PURPOSES ONLY.  IF CONFIRMATION IS NEEDED FOR ANY PURPOSE, NOTIFY LAB WITHIN 5 DAYS.        LOWEST DETECTABLE LIMITS FOR URINE DRUG SCREEN Drug Class       Cutoff (ng/mL) Amphetamine      1000 Barbiturate      200 Benzodiazepine   937 Tricyclics       169 Opiates          300 Cocaine          300 THC              50   Appointment on 09/30/2016  Component Date Value Ref Range Status  . Fecal Occult Bld 09/30/2016 Negative  Negative Final    Dg Chest 2 View  Result Date: 11/11/2016 CLINICAL DATA:  Shortness of Breath EXAM: CHEST  2 VIEW COMPARISON:  November 10, 2016 ; November 03, 2015 FINDINGS: There is a small pleural effusion on the right. There is less interstitial edema compared to 1 day prior. There  remains interstitial edema superimposed on interstitial thickening, likely due to a degree of underlying fibrosis. There is no airspace consolidation. Heart is mildly enlarged with mild pulmonary venous hypertension. There is aortic atherosclerosis. There old healed rib fractures on the right. Native coronary artery calcification appreciable ; patient is status post coronary artery bypass grafting. IMPRESSION: There remains a degree of appearing congestive heart failure superimposed on chronic interstitial thickening with slightly less edema compared to 1 day prior. No new opacity. Stable cardiac silhouette. There is aortic atherosclerosis. Patient is status post coronary artery bypass grafting. Native coronary artery calcification is appreciable. Electronically Signed   By: Lowella Grip III M.D.   On: 11/11/2016 07:07   Dg  Lumbar Spine Complete  Result Date: 10/22/2016 CLINICAL DATA:  Fall 6 days ago.  Pain. EXAM: LUMBAR SPINE - COMPLETE 4+ VIEW COMPARISON:  None. FINDINGS: Degenerative disc disease at L5-S1 with disc space narrowing, spurring and vacuum disc. Degenerative facet disease throughout the lumbar spine. Normal alignment. No fracture. Diffuse aortic and iliac calcifications. No visible aneurysm. IMPRESSION: No acute bony abnormality. Aortoiliac atherosclerosis. Electronically Signed   By: Rolm Baptise M.D.   On: 10/22/2016 13:18   Ct Head Wo Contrast  Result Date: 10/21/2016 CLINICAL DATA:  Fall.  History of HIV, diabetes, hypertension. EXAM: CT HEAD WITHOUT CONTRAST TECHNIQUE: Contiguous axial images were obtained from the base of the skull through the vertex without intravenous contrast. COMPARISON:  CT HEAD October 20, 2016 FINDINGS: BRAIN: The ventricles and sulci are normal for age. No intraparenchymal hemorrhage, mass effect nor midline shift. Patchy supratentorial white matter hypodensities less than expected for patient's age, though non-specific are most compatible with chronic  small vessel ischemic disease. No acute large vascular territory infarcts. No abnormal extra-axial fluid collections. Basal cisterns are patent. VASCULAR: Moderate calcific atherosclerosis of the carotid siphons. SKULL: No skull fracture. Small RIGHT frontal and RIGHT occipital scalp hematomas with RIGHT occipital scalp skin staples. SINUSES/ORBITS: The mastoid air-cells and included paranasal sinuses are well-aerated.The included ocular globes and orbital contents are non-suspicious. OTHER: None. IMPRESSION: No acute intracranial process ; negative CT HEAD for age. Electronically Signed   By: Elon Alas M.D.   On: 10/21/2016 23:54   Ct Head Wo Contrast  Result Date: 10/20/2016 CLINICAL DATA:  Unwitnessed fall at 2 a.m. with possible loss of consciousness. Right occipital scalp laceration. Hit head on glass table. EXAM: CT HEAD WITHOUT CONTRAST TECHNIQUE: Contiguous axial images were obtained from the base of the skull through the vertex without intravenous contrast. COMPARISON:  CT head 04/26/2015 FINDINGS: Brain: No acute infarct, hemorrhage, or mass lesion is present. The ventricles are of normal size. No significant extraaxial fluid collection is present. The basal ganglia are intact. No focal cortical lesions are present. Vascular: Dense atherosclerotic calcifications are present within the cavernous internal carotid arteries and at the dural margin of the vertebral arteries. There is no hyperdense vessel. Skull: The calvarium is intact. No acute fracture is present. No focal lytic or blastic lesions are evident. Sinuses/Orbits: Mild mucosal thickening is present in the anterior ethmoid air cells and inferior frontal sinuses. The paranasal sinuses and mastoid air cells are otherwise clear. The globes and orbits are within normal limits. Other: Soft tissue swelling is present in the occipital scalp without an underlying fracture or foreign body. IMPRESSION: 1. Soft tissue swelling within the  occipital scalp without underlying fracture or radiopaque foreign body. 2. Normal CT appearance of the brain for age. 3. Atherosclerosis. Electronically Signed   By: [name] Morelle M.D.   On: 10/20/2016 07:23   Dg Chest Port 1 View  Result Date: 11/10/2016 CLINICAL DATA:  66 y/o  M; shortness of breath for 2 days. EXAM: PORTABLE CHEST 1 VIEW COMPARISON:  08/01/2016 chest radiograph FINDINGS: Stable cardiac silhouette given projection and technique. Aortic atherosclerosis with arch calcification. Post median sternotomy. Multiple chronic right-sided rib fractures noted. Diffuse interstitial markings of the lungs and hazy opacities greatest in lung bases. IMPRESSION: Diffuse interstitial markings of the lungs and hazy opacities greatest in lung bases probably represents moderate pulmonary edema. Underlying pneumonia is not excluded. Electronically Signed   By: Kristine Garbe M.D.   On: 11/10/2016 05:14  Assessment/Plan   ICD-9-CM ICD-10-CM   1. Chronic combined systolic and diastolic congestive heart failure (HCC) 428.42 I50.42 BMP with eGFR   428.0    2. Ischemic cardiomyopathy 414.8 I25.5   3. Essential hypertension 401.9 I10   4. Type 2 diabetes mellitus with diabetic neuropathy, with long-term current use of insulin (HCC) 250.60 E11.40    357.2 Z79.4    V58.67    5. Mixed hyperlipidemia 272.2 E78.2   6. Human immunodeficiency virus (HIV) disease ([address]) 042 B20   7. Tobacco abuse counseling V65.42 Z71.6    305.1    8. Anemia of chronic disease 285.29 D63.8 CBC with Differential/Platelets  9. CKD (chronic kidney disease) stage 3, GFR 30-59 ml/min 585.3 N18.3 BMP with eGFR   Continue daily weight. Call if weight >184 lbs.  Continue current medications as ordered  Will call with lab results  Follow up with specialists as scheduled  Follow up in 4 mos for routine visit. Fasting labs prior to appt (cmp, lipid, a1c)    Sabeen Piechocki S. Perlie Gold  [facility] [address] [place], [place] [address] [phone] Cell (Monday-Friday 8 AM - 5 PM) [phone] After 5 PM and follow prompts

## 2016-11-18 NOTE — Patient Instructions (Addendum)
Continue daily weight. Call if weight >184 lbs.  Continue current medications as ordered  Will call with lab results  Follow up with specialists as scheduled  Follow up in 4 mos for routine visit. Fasting labs prior to appt

## 2016-11-19 LAB — BASIC METABOLIC PANEL WITH GFR
BUN: 31 mg/dL — AB (ref 7–25)
CO2: 25 mmol/L (ref 20–31)
CREATININE: 2.43 mg/dL — AB (ref 0.70–1.25)
Calcium: 9.4 mg/dL (ref 8.6–10.3)
Chloride: 107 mmol/L (ref 98–110)
GFR, Est African American: 31 mL/min — ABNORMAL LOW (ref >=60)
GFR, Est Non African American: 27 mL/min — ABNORMAL LOW (ref >=60)
GLUCOSE: 106 mg/dL — AB (ref 65–99)
Potassium: 5.4 mmol/L — ABNORMAL HIGH (ref 3.5–5.3)
Sodium: 139 mmol/L (ref 135–146)

## 2016-11-20 DIAGNOSIS — N183 Chronic kidney disease, stage 3 unspecified: Secondary | ICD-10-CM | POA: Insufficient documentation

## 2016-11-20 DIAGNOSIS — D638 Anemia in other chronic diseases classified elsewhere: Secondary | ICD-10-CM | POA: Insufficient documentation

## 2016-11-20 NOTE — Progress Notes (Signed)
   Subjective:  Patient with a history of diabetes mellitus presents today for evaluation ulceration(s) to the lower extremitie(s). Patient states that he did have a history of diabetes mellitus. Patient also complains of painful callus lesions to the bilateral feet. Patient was last seen by Dr. Prudence Davidson on 10/25/2016. Patient states that he still has painful callus lesions Patient presents today for further treatment and evaluation   Objective/Physical Exam General: The patient is alert and oriented x3 in no acute distress.  Dermatology:  Wound #1 noted to the left great toe measuring [phone] cm (LxWxD).   Wound #2 noted to the right first MPJ measuring 1.00.10.1cm (LxWxD)  Wound #3 is located to the right sub-fourth MPJ measuring 1.30.10.1cm (LxWxD)  To the noted ulceration(s), there is no eschar. There is a moderate amount of slough, fibrin, and necrotic tissue noted. Granulation tissue and wound base is red. There is a minimal amount of serosanguineous drainage noted. There is no exposed bone muscle-tendon ligament or joint. There is no malodor. Periwound integrity is intact. Skin is warm, dry and supple bilateral lower extremities. Painful hyperkeratotic callus lesions are also noted to the weightbearing surfaces the bilateral foot 6  Vascular: Palpable pedal pulses bilaterally. No edema or erythema noted. Capillary refill within normal limits.  Neurological: Epicritic and protective threshold absent bilaterally.   Musculoskeletal Exam: Range of motion within normal limits to all pedal and ankle joints bilateral. Muscle strength 5/5 in all groups bilateral.   Assessment: #1 multiple bilateral ulcerations secondary to diabetes mellitus #2 diabetes mellitus w/ peripheral neuropathy  #3 painful porokeratosis bilateral foot 6  Plan of Care:  #1 Patient was evaluated. #2 medically necessary excisional debridement including subcutaneous tissue was performed using a tissue nipper  and a chisel blade. Excisional debridement of all the necrotic nonviable tissue down to healthy bleeding viable tissue was performed with post-debridement measurements same as pre-. #3 the wound was cleansed and dry sterile dressing applied. #4 patient is to return to clinic in 2 weeks.   Edrick Kins, DPM [facility]  Dr. Edrick Kins, [place]                                        [place], [place] [address]                Office [phone]  Fax [phone]

## 2016-11-21 ENCOUNTER — Encounter: Payer: Self-pay | Admitting: Pharmacotherapy

## 2016-11-21 ENCOUNTER — Ambulatory Visit (INDEPENDENT_AMBULATORY_CARE_PROVIDER_SITE_OTHER): Payer: Medicare Other | Admitting: Pharmacotherapy

## 2016-11-21 VITALS — BP 100/68 | HR 80 | Resp 12 | Ht 74.0 in | Wt 189.4 lb

## 2016-11-21 DIAGNOSIS — E1140 Type 2 diabetes mellitus with diabetic neuropathy, unspecified: Secondary | ICD-10-CM

## 2016-11-21 DIAGNOSIS — Z794 Long term (current) use of insulin: Secondary | ICD-10-CM

## 2016-11-21 DIAGNOSIS — I10 Essential (primary) hypertension: Secondary | ICD-10-CM

## 2016-11-21 NOTE — Progress Notes (Signed)
  Subjective:    Jacob [name] is a 66 y.o.African American male who presents for follow-up of Type 2 diabetes mellitus.   Current A1C:  6.2% (11/10/16) Logbook:  Average BG: 12m/dl Hypoglycemia x 2  Went on a "bender".  Stopped taking medication, drinking, fell into a glass table and required 5 staples. On 11/10/16 went back to hospital for ER with CHF exacerbation. Has not had any alcohol in 30 days.  Weakness is [employer]. Has had a gout flare - took colchicine. Currently on Lantus 70 units daily Humalog 12-15 units with meals.  No routine exercise since his fall. Wears glasses.  Eye exam scheduled for 01/06/17. Some pain in feet.  No sores.  His cuts from the fall have healed. Denies peripheral edema. Nocturia 1-2 times per night No dysuria. Staying well hydrated with water and sugar-free popcicles.   Review of Systems A comprehensive review of systems was negative except for: Eyes: positive for contacts/glasses Genitourinary: positive for nocturia Integument/breast: positive for dryness    Objective:    BP 100/68   Pulse 80   Resp 12   Ht 6' 2"$  (1.88 m)   Wt 189 lb 6.4 oz (85.9 kg)   BMI 24.32 kg/m   General:  alert, cooperative and no distress  Oropharynx: normal findings: lips normal without lesions and gums healthy   Eyes:  negative findings: lids and lashes normal and conjunctivae and sclerae normal   Ears:  external ears normal        Lung: clear to auscultation bilaterally  Heart:  regular rate and rhythm     Extremities: extremities normal, atraumatic, no cyanosis or edema  Skin: dry and healing wounds from fall     Neuro: mental status, speech normal, alert and oriented x3 and gait and station normal   Lab Review Glucose, Bld (mg/dL)  Date Value  11/18/2016 106 (H)  11/11/2016 87  11/10/2016 158 (H)   CO2 (mmol/L)  Date Value  11/18/2016 25  11/11/2016 25  11/10/2016 22   BUN (mg/dL)  Date Value  11/18/2016 31 (H)   11/11/2016 26 (H)  11/10/2016 24 (H)  04/04/2016 25  11/16/2015 18  10/30/2015 61 (H)   Creat (mg/dL)  Date Value  11/18/2016 2.43 (H)  08/18/2016 1.77 (H)  08/09/2016 2.07 (H)   Creatinine, Ser (mg/dL)  Date Value  11/11/2016 1.95 (H)  11/10/2016 1.88 (H)  10/23/2016 1.71 (H)       Assessment:    Diabetes Mellitus type II, under excellent control.  A1C at goal <7% BP at goal <130/80   Plan:    1.  Rx changes: none  2.  Continue Lantus 70 units daily. 3.  Continue Humalog 12 units with each meal. 4.  Counseled on nutrition goals. 5.  Counseled on need for routine exercise.  Goal is 30-45 minutes 5 x week. 6.  BP at goal <130/80

## 2016-11-21 NOTE — Patient Instructions (Signed)
Exercise goal is 30-45 minutes 5 x week

## 2016-11-22 ENCOUNTER — Other Ambulatory Visit: Payer: Self-pay | Admitting: Licensed Clinical Social Worker

## 2016-11-22 ENCOUNTER — Telehealth: Payer: Self-pay | Admitting: *Deleted

## 2016-11-22 ENCOUNTER — Telehealth: Payer: Self-pay

## 2016-11-22 ENCOUNTER — Other Ambulatory Visit: Payer: Self-pay

## 2016-11-22 DIAGNOSIS — Z794 Long term (current) use of insulin: Secondary | ICD-10-CM

## 2016-11-22 DIAGNOSIS — N183 Chronic kidney disease, stage 3 unspecified: Secondary | ICD-10-CM

## 2016-11-22 DIAGNOSIS — N289 Disorder of kidney and ureter, unspecified: Secondary | ICD-10-CM

## 2016-11-22 DIAGNOSIS — E1140 Type 2 diabetes mellitus with diabetic neuropathy, unspecified: Secondary | ICD-10-CM

## 2016-11-22 MED ORDER — FUROSEMIDE 40 MG PO TABS: 40.0000 mg | ORAL_TABLET | Freq: Every day | ORAL | Status: DC

## 2016-11-22 NOTE — Telephone Encounter (Signed)
Patient aware of changes. Will make changes to patient's medication list.

## 2016-11-22 NOTE — Patient Outreach (Signed)
[place] [facility]) Care Management   11/22/2016  Jacob [name] 11-22-1950 [id number]  Jacob [name] is an 66 y.o. male  Subjective:  When I went to the hospital, I had swelling all the way up my back.  Objective:   ROS Thin, elderly bald man  Physical Exam ROS  Encounter Medications:   Outpatient Encounter Prescriptions as of 11/22/2016  Medication Sig  . allopurinol (ZYLOPRIM) 100 MG tablet Take 1 tablet (100 mg total) by mouth daily.  [name] aspirin EC 81 MG tablet Take 81 mg by mouth daily.  [name] atorvastatin (LIPITOR) 20 MG tablet Take 1 tablet (20 mg total) by mouth daily.  . B-D UF III MINI PEN NEEDLES 31G X 5 MM MISC USE 4 TIMES DAILY  . carvedilol (COREG) 25 MG tablet Take 1 tablet (25 mg total) by mouth 2 (two) times daily with a meal.  . Choline Fenofibrate 135 MG capsule Take 1 capsule (135 mg total) by mouth daily.  . colchicine 0.6 MG tablet Take 0.6 mg by mouth daily as needed (gout). Reported on 04/04/2016  . elvitegravir-cobicistat-emtricitabine-tenofovir (GENVOYA) 150-150-200-10 MG TABS tablet TAKE 1 TABLET BY MOUTH DAILY WITH BREAKFAST.  . furosemide (LASIX) 40 MG tablet Take 1 tablet (40 mg total) by mouth daily.  [name] gabapentin (NEURONTIN) 300 MG capsule take 1 capsule by mouth three times a day  . Glucosamine-Chondroit-Vit C-Mn (GLUCOSAMINE CHONDR 1500 COMPLX PO) Take 2 tablets by mouth daily. Reported on 04/04/2016  . hydrALAZINE (APRESOLINE) 25 MG tablet take 1 tablet by mouth three times a day  . insulin lispro (HUMALOG KWIKPEN) 100 UNIT/ML KiwkPen Inject 12 Units into the skin 3 (three) times daily.  . isosorbide mononitrate (IMDUR) 30 MG 24 hr tablet Take 30 mg by mouth daily. Reported on 04/04/2016  . LANTUS SOLOSTAR 100 UNIT/ML Solostar Pen Inject 70 Units into the skin every morning. 35 units on each side  . latanoprost (XALATAN) 0.005 % ophthalmic solution Place 1 drop into both eyes at bedtime. Reported on 04/04/2016  . Naftifine HCl 2 % CREA apply  to affected area twice a day  . nitroGLYCERIN (NITROSTAT) 0.3 MG SL tablet Place 1 tablet (0.3 mg total) under the tongue as needed for chest pain (not to exceed 3 tablets in one day.).  [name] omeprazole (PRILOSEC) 20 MG capsule Take 1 capsule (20 mg total) by mouth 2 (two) times daily before a meal.  . ONE TOUCH LANCETS MISC Check BS twice daily.  . ONE TOUCH ULTRA TEST test strip CHECK BLOOD SUGAR TWO TIMES DAILY AS DIRECTED  . umeclidinium-vilanterol (ANORO ELLIPTA) 62.5-25 MCG/INH AEPB Inhale 1 puff into the lungs daily.  . ondansetron (ZOFRAN ODT) 4 MG disintegrating tablet Take 1 tablet (4 mg total) by mouth every 8 (eight) hours as needed for nausea or vomiting. (Patient not taking: Reported on 11/22/2016)  . [DISCONTINUED] furosemide (LASIX) 40 MG tablet Take 1 tablet (40 mg total) by mouth 2 (two) times daily.   No facility-administered encounter medications on file as of 11/22/2016.     Functional Status:   In your present state of health, do you have any difficulty performing the following activities: 11/10/2016 08/18/2016  Hearing? N N  Vision? N N  Difficulty concentrating or making decisions? N Y  Walking or climbing stairs? N N  Dressing or bathing? N N  Doing errands, shopping? N N  Preparing Food and eating ? - N  Using the Toilet? - N  In the past six months, have  you accidently leaked urine? - N  Do you have problems with loss of bowel control? - N  Managing your Medications? - N  Managing your Finances? - N  Housekeeping or managing your Housekeeping? - N  Some recent data might be hidden    Fall/Depression Screening:    PHQ 2/9 Scores 08/18/2016 07/12/2016 04/14/2016 08/07/2015 06/17/2015 12/29/2014 06/03/2014  PHQ - 2 Score 0 0 0 0 2 0 0  PHQ- 9 Score - - - - 8 - -   [facility] CM Care Plan Problem One   Flowsheet Row Most Recent Value  Care Plan Problem One  knowledge deficit related to low sodium diet  Role Documenting the Problem One  Care Management [place]  for Problem One  Active  [facility] Long Term Goal (31-90 days)  In the next 31 days, patient will be able to create 2 examples of low sodium diets  [facility] Long Term Goal Start Date  11/15/16  Interventions for Problem One Long Term Goal  Initila home visit. patient reports having no acute care admissions since our last conversation..  [facility] CM Short Term Goal #1 (0-30 days)  in the next 14 days, patient will  meet with [facility] RNCM for low sodium diet education  [facility] CM Short Term Goal #1 Start Date  11/15/16  [facility] CM Short Term Goal #1 Met Date  11/22/16  Interventions for Short Term Goal #1  Initial home visit for low sodium diet.  RNCM assisted patient in identifying foods high in sodium including processed foods such as ham, bacon, [place]   [facility] CM Short Term Goal #2 (0-30 days)  in the next 28 days, patient will weight 21 out of 28 times  [facility] CM Short Term Goal #2 Start Date  11/15/16  Interventions for Short Term Goal #2  patient weights daily, ihas journal of recirding weights      Fall Risk  11/22/2016 11/18/2016 10/31/2016 08/18/2016 08/09/2016  Falls in the past year? Yes Yes Yes Yes No  Number falls in past yr: 2 or more _0 -  Injury with Fall? Yes Yes Yes Yes -  Risk Factor Category  High Fall Risk - - - -  Risk for fall due to : Medication side effect;History of fall(s);Impaired vision - - - -  Risk for fall due to (comments): - - - - -  Follow up Education provided;Falls prevention discussed - - Falls prevention discussed -   Assessment:   Initial home visit for community care coordination. Member lives in a second floor condom [place] [address]. Patient reports living alone, is very active in his church and the community. Patient has medications, uses transportation to most appointments, however, he has eye appointment in March.   Patient has requested assistance in securing transportation for that appointment because he cannot maneuver the hill leading to the office after getting off public  transportation. Referral for [facility] LCSW to assist with transportation. Plan:  Telephone contact next week to assess his progress in meeting his case management goals

## 2016-11-22 NOTE — Telephone Encounter (Signed)
-----   Message from [name], MD sent at 11/22/2016  9:58 AM EST ----- Decrease lasix to 40 mg once/day and make sure he is not taking any K

## 2016-11-22 NOTE — Patient Outreach (Signed)
[facility]) Care Management  11/22/2016  COLEN ELTZROTH Sep 23, 1951 [id number]   Assessment- CSW received new referral on patient today on 11/22/16. CSW completed initial outreach call to patient and patient answered. HIPPA verifications were provided. CSW introduced self, reason for call and of [facility] social work services. Patient reports that he usually has stable transportation through [employer] and [employer]. However, [employer] is not able to assist him due to their agency relocating and [employer] would drop him off at a stop where he would have to walk up a hill. He is requesting transportation assistance to one appointment. Patient receives Medicaid MQB and therefore qualifies for [facility] financial assistance program. Patient denies having any other social work needs at this time.  Patient's appointment is on 01/06/17 at 10:30 am at [facility] [place], [place], [place] [address] and their phone number is [phone]. Patient will be transported to and from appointment by [employer] which will be arranged by a [place] Management Assistant.  CSW will send this request to [place] Management Assistant and request that patient receive a call of confirmation. Patient denies needing any further social work assistance and is agreeable to social work discharge.   Plan-CSW will not open case. CSW will update [facility] RNCM. CSW will send in basket request to [place] Management Assistant.  Eula Fried, BSW, [email or web address] Phone: [phone] Fax: [phone]

## 2016-11-22 NOTE — Telephone Encounter (Signed)
-----   Message from [place], [place] sent at 11/20/2016  5:40 PM EST ----- Kidney function worse and likely due to increased fluid pill. Recommend he follow up with cardio as scheduled and will need repeat BMP in 1 week to follow kidney level. Refer to nephrology for worsening renal function

## 2016-11-22 NOTE — Telephone Encounter (Signed)
Patient called returning Terry's call regarding lab results. Results given. Patient agreed. Patient has an appointment with Dr. [name] (cardiologist) on Thursday 2/1 and will discuss the fluid pill with him. Faxed labs to Dr. [name]. Appointment scheduled for repeat BMP on 11/29/16 and referral placed for Nephrology.

## 2016-11-23 ENCOUNTER — Other Ambulatory Visit: Payer: Self-pay | Admitting: Internal Medicine

## 2016-11-23 ENCOUNTER — Telehealth: Payer: Self-pay

## 2016-11-23 ENCOUNTER — Other Ambulatory Visit: Payer: Self-pay | Admitting: Cardiovascular Disease

## 2016-11-23 DIAGNOSIS — K219 Gastro-esophageal reflux disease without esophagitis: Secondary | ICD-10-CM

## 2016-11-23 NOTE — Telephone Encounter (Signed)
Patient called to discuss labs dated 11/18/16. Patient states these labs were discussed yet he needed a better understanding.  Patient was questioning kidney function status for the last 6 months, patient asked for clarification on Nephrology referral process. Patient informed me of conversation with Cardiology (noted in epic on lab results). Patient confirmed pending appointment to recheck BMP. Patient states after BMP is checked he will ask Dr.Carter if a referral to the kidney specialist is really needed.   Patient had a clear understanding at the end of conversation and all questions were answered

## 2016-11-24 ENCOUNTER — Encounter: Payer: Self-pay | Admitting: Cardiovascular Disease

## 2016-11-24 ENCOUNTER — Ambulatory Visit (INDEPENDENT_AMBULATORY_CARE_PROVIDER_SITE_OTHER): Payer: Medicare Other | Admitting: Cardiovascular Disease

## 2016-11-24 VITALS — BP 112/60 | HR 71 | Ht 74.0 in | Wt 188.6 lb

## 2016-11-24 DIAGNOSIS — I5022 Chronic systolic (congestive) heart failure: Secondary | ICD-10-CM | POA: Diagnosis not present

## 2016-11-24 MED ORDER — FUROSEMIDE 40 MG PO TABS: 40.0000 mg | ORAL_TABLET | ORAL | Status: DC | PRN

## 2016-11-24 NOTE — Patient Instructions (Addendum)
Medication Instructions:  Your physician has recommended you make the following change in your medication:  1-Please take your Lasix as needed   Labwork: NONE  Testing/Procedures: NONE  Follow-Up: Your physician wants you to follow-up in: 3 months with Dr. [name].   If you need a refill on your cardiac medications before your next appointment, please call your pharmacy.

## 2016-11-28 ENCOUNTER — Telehealth: Payer: Self-pay | Admitting: *Deleted

## 2016-11-28 NOTE — Telephone Encounter (Signed)
Patient called and stated that his weight it up to 184 this morning. His ankles are feeling tight but was worse yesterday but alittle better today. He stated that he ate dirty rice with sodium yesterday and knows this is where it is coming from.  He stated that you told him to let you know.

## 2016-11-28 NOTE — Telephone Encounter (Signed)
STOP CONSUMING HIGH SALT FOODS! Fluid restrict 1500cc per day. Continue daily weights. Take all meds as ordered

## 2016-11-28 NOTE — Telephone Encounter (Signed)
Patient notified and agreed.  

## 2016-11-29 ENCOUNTER — Other Ambulatory Visit: Payer: Medicare Other

## 2016-11-29 ENCOUNTER — Other Ambulatory Visit: Payer: Self-pay

## 2016-11-29 DIAGNOSIS — N183 Chronic kidney disease, stage 3 unspecified: Secondary | ICD-10-CM

## 2016-11-29 LAB — BASIC METABOLIC PANEL WITH GFR
BUN: 37 mg/dL — ABNORMAL HIGH (ref 7–25)
CALCIUM: 8.8 mg/dL (ref 8.6–10.3)
CO2: 23 mmol/L (ref 20–31)
Chloride: 109 mmol/L (ref 98–110)
Creat: 2.43 mg/dL — ABNORMAL HIGH (ref 0.70–1.25)
GFR, EST AFRICAN AMERICAN: 31 mL/min — AB (ref >=60)
GFR, EST NON AFRICAN AMERICAN: 27 mL/min — AB (ref >=60)
Glucose, Bld: 100 mg/dL — ABNORMAL HIGH (ref 65–99)
POTASSIUM: 4.9 mmol/L (ref 3.5–5.3)
SODIUM: 138 mmol/L (ref 135–146)

## 2016-11-30 ENCOUNTER — Other Ambulatory Visit: Payer: Self-pay | Admitting: *Deleted

## 2016-11-30 ENCOUNTER — Telehealth: Payer: Self-pay | Admitting: Cardiovascular Disease

## 2016-11-30 MED ORDER — FUROSEMIDE 40 MG PO TABS: 40.0000 mg | ORAL_TABLET | ORAL | 2 refills | Status: DC | PRN

## 2016-11-30 NOTE — Telephone Encounter (Signed)
Patient stated he has gained 2 pounds over the last couple of days and his ankles are swollen. Informed patient that he has lasix he can take PRN for edema. Patient stated he already took Lasix this morning and his weight went back down. Patient requested refill for Lasix and let Dr. [name] know he had to take the lasix. Will forward to Dr. [name].

## 2016-11-30 NOTE — Telephone Encounter (Signed)
New Message     Please call the pt is concerned his weight has went up to 185, he has tightness in he ankles.  Pt stated that Dr [name] does not wanting him taking water pills

## 2016-11-30 NOTE — Telephone Encounter (Signed)
Patient called and was upset as he was at the pharmacy expecting a refill for furosemide but they did not receive it. He stated that he spoke with the nurse earlier today and she informed him that she would send the refill in. Patient aware that I will get this sent for him. He was very Patent attorney.

## 2016-11-30 NOTE — Telephone Encounter (Signed)
Ok to call in lasix

## 2016-12-01 ENCOUNTER — Other Ambulatory Visit: Payer: Self-pay

## 2016-12-01 DIAGNOSIS — E119 Type 2 diabetes mellitus without complications: Secondary | ICD-10-CM | POA: Diagnosis not present

## 2016-12-01 NOTE — Telephone Encounter (Signed)
Patient aware of Dr. Nishan's recommendations. 

## 2016-12-01 NOTE — Patient Outreach (Signed)
   Unsuccessful attempt made to contact patient via telephone. HIPPA compliant message left for patient with this RNCM's contact information.    Plan: Make another attempt to contact patient in the next 21 days.

## 2016-12-07 ENCOUNTER — Other Ambulatory Visit: Payer: Self-pay

## 2016-12-07 NOTE — Patient Outreach (Signed)
Jacob [name]) Care Management   12/07/2016  Jacob [name] Nov 20, 1950 [id number]  Jacob [name] is an 66 y.o. male  Subjective:  My blood glucose have been running real good. I have lost weight.  Objective:   ROS  Elderly, slim gentleman, well groomed, keeps detailed records of his weights and blood glucose readings.  Physical Exam  ROS  Encounter Medications:   Outpatient Encounter Prescriptions as of 12/07/2016  Medication Sig  . allopurinol (ZYLOPRIM) 100 MG tablet Take 1 tablet (100 mg total) by mouth daily.  Jearl Klinefelter ELLIPTA 62.5-25 MCG/INH AEPB INHALE 1 PUFF INTO THE LUNGS DAILY  . aspirin EC 81 MG tablet Take 81 mg by mouth daily.  [name] atorvastatin (LIPITOR) 20 MG tablet Take 1 tablet (20 mg total) by mouth daily.  . B-D UF III MINI PEN NEEDLES 31G X 5 MM MISC USE 4 TIMES DAILY  . carvedilol (COREG) 25 MG tablet take 1 tablet by mouth twice a day with meals  . Choline Fenofibrate 135 MG capsule Take 1 capsule (135 mg total) by mouth daily.  . colchicine 0.6 MG tablet Take 0.6 mg by mouth daily as needed (gout). Reported on 04/04/2016  . elvitegravir-cobicistat-emtricitabine-tenofovir (GENVOYA) 150-150-200-10 MG TABS tablet TAKE 1 TABLET BY MOUTH DAILY WITH BREAKFAST.  . furosemide (LASIX) 40 MG tablet Take 1 tablet (40 mg total) by mouth as needed for fluid or edema.  . gabapentin (NEURONTIN) 300 MG capsule take 1 capsule by mouth three times a day  . Glucosamine-Chondroit-Vit C-Mn (GLUCOSAMINE CHONDR 1500 COMPLX PO) Take 2 tablets by mouth daily. Reported on 04/04/2016  . hydrALAZINE (APRESOLINE) 25 MG tablet take 1 tablet by mouth three times a day  . insulin lispro (HUMALOG KWIKPEN) 100 UNIT/ML KiwkPen Inject 12 Units into the skin 3 (three) times daily.  . isosorbide mononitrate (IMDUR) 30 MG 24 hr tablet Take 30 mg by mouth daily. Reported on 04/04/2016  . LANTUS SOLOSTAR 100 UNIT/ML Solostar Pen Inject 70 Units into the skin every morning. 35 units on each  side  . latanoprost (XALATAN) 0.005 % ophthalmic solution Place 1 drop into both eyes at bedtime. Reported on 04/04/2016  . Naftifine HCl 2 % CREA apply to affected area twice a day  . nitroGLYCERIN (NITROSTAT) 0.3 MG SL tablet Place 1 tablet (0.3 mg total) under the tongue as needed for chest pain (not to exceed 3 tablets in one day.).  [name] omeprazole (PRILOSEC) 20 MG capsule take 1 capsule by mouth twice a day before meals  . ONE TOUCH LANCETS MISC Check BS twice daily.  . ONE TOUCH ULTRA TEST test strip CHECK BLOOD SUGAR TWO TIMES DAILY AS DIRECTED   No facility-administered encounter medications on file as of 12/07/2016.     Functional Status:   In your present state of health, do you have any difficulty performing the following activities: 11/10/2016 08/18/2016  Hearing? N N  Vision? N N  Difficulty concentrating or making decisions? N Y  Walking or climbing stairs? N N  Dressing or bathing? N N  Doing errands, shopping? N N  Preparing Food and eating ? - N  Using the Toilet? - N  In the past six months, have you accidently leaked urine? - N  Do you have problems with loss of bowel control? - N  Managing your Medications? - N  Managing your Finances? - N  Housekeeping or managing your Housekeeping? - N  Some recent data might be hidden    Fall/Depression  Screening:    PHQ 2/9 Scores 11/22/2016 08/18/2016 07/12/2016 04/14/2016 08/07/2015 06/17/2015 12/29/2014  PHQ - 2 Score 0 0 0 0 0 2 0  PHQ- 9 Score - - - - - 8 -   THN CM Care Plan Problem One   Flowsheet Row Most Recent Value  Care Plan Problem One  knowledge deficit related to low sodium diet  Role Documenting the Problem One  Care Management [place] for Problem One  Active  THN Long Term Goal (31-90 days)  In the next 31 days, patient will be able to create 2 examples of low sodium diets  THN Long Term Goal Start Date  11/15/16  Interventions for Problem One Long Term Goal  Home visit to assess his progress in  reaching his case mangement goals  THN CM Short Term Goal #1 (0-30 days)  in the next 14 days, patient will  meet with [facility] RNCM for low sodium diet education  [facility] CM Short Term Goal #1 Start Date  11/15/16  [facility] CM Short Term Goal #1 Met Date  11/22/16  Interventions for Short Term Goal #1  Initial home visit for low sodium diet.  RNCM assisted patient in identifying foods high in sodium including processed foods such as ham, bacon, [place]   THN CM Short Term Goal #2 (0-30 days)  in the next 28 days, patient will weight 21 out of 28 times  THN CM Short Term Goal #2 Start Date  11/15/16  Interventions for Short Term Goal #2  home visit with patient, patient has consistently weighed daily     Assessment:   Per patient report, he has made some very positive changes in the management of his sodium intake. Patient reports reading labels more for carbohydrate and sodium intake. Patient and RNCM reviewed his case management goals, updated.   Plan:  Telephone contact in the next 21 days for community care coordination.

## 2016-12-09 ENCOUNTER — Ambulatory Visit: Payer: Self-pay

## 2016-12-11 ENCOUNTER — Other Ambulatory Visit: Payer: Self-pay | Admitting: Internal Medicine

## 2016-12-13 DIAGNOSIS — Z720 Tobacco use: Secondary | ICD-10-CM | POA: Diagnosis not present

## 2016-12-13 DIAGNOSIS — R300 Dysuria: Secondary | ICD-10-CM | POA: Diagnosis not present

## 2016-12-13 DIAGNOSIS — N2581 Secondary hyperparathyroidism of renal origin: Secondary | ICD-10-CM | POA: Diagnosis not present

## 2016-12-13 DIAGNOSIS — N183 Chronic kidney disease, stage 3 (moderate): Secondary | ICD-10-CM | POA: Diagnosis not present

## 2016-12-13 DIAGNOSIS — D631 Anemia in chronic kidney disease: Secondary | ICD-10-CM | POA: Diagnosis not present

## 2016-12-16 ENCOUNTER — Ambulatory Visit: Payer: Medicare Other

## 2016-12-16 ENCOUNTER — Other Ambulatory Visit: Payer: Self-pay

## 2016-12-16 NOTE — Patient Outreach (Addendum)
Jacob [name] Jacob [name]) Care Management  Jacob [name]  12/16/2016   Jacob [name] 1951/06/20 [id number]  Subjective:  My weight and my blood sugars have been running steady in the right directions.   Objective:  Telephonic contact  Encounter Medications:  Outpatient Encounter Prescriptions as of 12/16/2016  Medication Sig  . allopurinol (ZYLOPRIM) 100 MG tablet Take 1 tablet (100 mg total) by mouth daily.  Jearl Klinefelter ELLIPTA 62.5-25 MCG/INH AEPB INHALE 1 PUFF INTO THE LUNGS DAILY  . aspirin EC 81 MG tablet Take 81 mg by mouth daily.  [name] atorvastatin (LIPITOR) 20 MG tablet Take 1 tablet (20 mg total) by mouth daily.  . B-D UF III MINI PEN NEEDLES 31G X 5 MM MISC USE 4 TIMES DAILY  . carvedilol (COREG) 25 MG tablet take 1 tablet by mouth twice a day with meals  . Choline Fenofibrate 135 MG capsule Take 1 capsule (135 mg total) by mouth daily.  . colchicine 0.6 MG tablet Take 0.6 mg by mouth daily as needed (gout). Reported on 04/04/2016  . elvitegravir-cobicistat-emtricitabine-tenofovir (GENVOYA) 150-150-200-10 MG TABS tablet TAKE 1 TABLET BY MOUTH DAILY WITH BREAKFAST.  . furosemide (LASIX) 40 MG tablet Take 1 tablet (40 mg total) by mouth as needed for fluid or edema.  . gabapentin (NEURONTIN) 300 MG capsule take 1 capsule by mouth three times a day  . Glucosamine-Chondroit-Vit C-Mn (GLUCOSAMINE CHONDR 1500 COMPLX PO) Take 2 tablets by mouth daily. Reported on 04/04/2016  . HUMALOG KWIKPEN 100 UNIT/ML KiwkPen inject 12 units subcutaneously three times a day before meals  . hydrALAZINE (APRESOLINE) 25 MG tablet take 1 tablet by mouth three times a day  . isosorbide mononitrate (IMDUR) 30 MG 24 hr tablet Take 30 mg by mouth daily. Reported on 04/04/2016  . LANTUS SOLOSTAR 100 UNIT/ML Solostar Pen Inject 70 Units into the skin every morning. 35 units on each side  . latanoprost (XALATAN) 0.005 % ophthalmic solution Place 1 drop into both eyes at bedtime. Reported on 04/04/2016  .  Naftifine HCl 2 % CREA apply to affected area twice a day  . nitroGLYCERIN (NITROSTAT) 0.3 MG SL tablet Place 1 tablet (0.3 mg total) under the tongue as needed for chest pain (not to exceed 3 tablets in one day.).  [name] omeprazole (PRILOSEC) 20 MG capsule take 1 capsule by mouth twice a day before meals  . ONE TOUCH LANCETS MISC Check BS twice daily.  . ONE TOUCH ULTRA TEST test strip CHECK BLOOD SUGAR TWO TIMES DAILY AS DIRECTED   No facility-administered encounter medications on file as of 12/16/2016.     Functional Status:  In your present state of health, do you have any difficulty performing the following activities: 11/10/2016 08/18/2016  Hearing? N N  Vision? N N  Difficulty concentrating or making decisions? N Y  Walking or climbing stairs? N N  Dressing or bathing? N N  Doing errands, shopping? N N  Preparing Food and eating ? - N  Using the Toilet? - N  In the past six months, have you accidently leaked urine? - N  Do you have problems with loss of bowel control? - N  Managing your Medications? - N  Managing your Finances? - N  Housekeeping or managing your Housekeeping? - N  Some recent data might be hidden    Fall/Depression Screening: PHQ 2/9 Scores 11/22/2016 08/18/2016 07/12/2016 04/14/2016 08/07/2015 06/17/2015 12/29/2014  PHQ - 2 Score 0 0 0 0 0 2 0  PHQ- 9 Score - - - - -  8 -   Jacob [name] Problem One   Flowsheet Row Most Recent Value  Care [name] Problem One  knowledge deficit related to low sodium diet  Role Documenting the Problem One  Care Management Coordinator  Care [name] for Problem One  Active  Jacob Long Term Goal (31-90 days)  In the next 31 days, patient will be able to create 2 examples of low sodium diets  Jacob Long Term Goal Start Date  11/15/16  Jacob [name] Long Term Goal Met Date  12/16/16  Interventions for Problem One Long Term Goal  During this telephone call, pateint was able to list low sodium foods he would use for meal preparations , ie, fresh meats,  green vegetables/fresh or frozen  Jacob CM Short Term Goal #1 (0-30 days)  in the next 14 days, patient will  meet with Jacob [name] RNCM for low sodium diet education  Jacob [name] CM Short Term Goal #1 Start Date  11/15/16  [name] CM Short Term Goal #1 Met Date  11/22/16  Interventions for Short Term Goal #1  Initial home visit for low sodium diet.  RNCM assisted patient in identifying foods high in sodium including processed foods such as ham, bacon, [place]   Jacob CM Short Term Goal #2 (0-30 days)  in the next 28 days, patient will weight 21 out of 28 times  Jacob CM Short Term Goal #2 Start Date  11/15/16  Jacob [name] CM Short Term Goal #2 Met Date  12/16/16  Interventions for Short Term Goal #2  Patient endorses daily weights  and recordings for the last 31 days.       Assessment:  Patient continues progress in meeting his case management goals. Patient reports adherence to chronic disease management action plans.  [name]:  Telephone contact in the next 21 days to assess patient's progress in meeting his case management goals

## 2016-12-21 ENCOUNTER — Other Ambulatory Visit: Payer: Self-pay

## 2016-12-22 ENCOUNTER — Other Ambulatory Visit: Payer: Self-pay | Admitting: Internal Medicine

## 2016-12-22 NOTE — Patient Outreach (Signed)
[facility]) Care Management  12/21/2016   Jacob [name] 1951/10/22 [id number]  Subjective:  My sugar readings have been very good My weights have been almost the same everyday Objective:   Current Medications:  Current Outpatient Prescriptions  Medication Sig Dispense Refill  . allopurinol (ZYLOPRIM) 100 MG tablet take 1 tablet by mouth once daily 30 tablet 3  . ANORO ELLIPTA 62.5-25 MCG/INH AEPB INHALE 1 PUFF INTO THE LUNGS DAILY 60 each 5  . aspirin EC 81 MG tablet Take 81 mg by mouth daily.    [name] atorvastatin (LIPITOR) 20 MG tablet Take 1 tablet (20 mg total) by mouth daily. 30 tablet 3  . B-D UF III MINI PEN NEEDLES 31G X 5 MM MISC USE 4 TIMES DAILY 150 each 11  . carvedilol (COREG) 25 MG tablet take 1 tablet by mouth twice a day with meals 180 tablet 1  . Choline Fenofibrate 135 MG capsule Take 1 capsule (135 mg total) by mouth daily. 30 capsule 6  . colchicine 0.6 MG tablet Take 0.6 mg by mouth daily as needed (gout). Reported on 04/04/2016    . elvitegravir-cobicistat-emtricitabine-tenofovir (GENVOYA) 150-150-200-10 MG TABS tablet TAKE 1 TABLET BY MOUTH DAILY WITH BREAKFAST. 30 tablet 11  . furosemide (LASIX) 40 MG tablet Take 1 tablet (40 mg total) by mouth as needed for fluid or edema. 30 tablet 2  . gabapentin (NEURONTIN) 300 MG capsule take 1 capsule by mouth three times a day 90 capsule 1  . Glucosamine-Chondroit-Vit C-Mn (GLUCOSAMINE CHONDR 1500 COMPLX PO) Take 2 tablets by mouth daily. Reported on 04/04/2016    . HUMALOG KWIKPEN 100 UNIT/ML KiwkPen inject 12 units subcutaneously three times a day before meals 15 mL 5  . hydrALAZINE (APRESOLINE) 25 MG tablet take 1 tablet by mouth three times a day 90 tablet 8  . isosorbide mononitrate (IMDUR) 30 MG 24 hr tablet Take 30 mg by mouth daily. Reported on 04/04/2016    . LANTUS SOLOSTAR 100 UNIT/ML Solostar Pen Inject 70 Units into the skin every morning. 35 units on each side  0  . latanoprost (XALATAN) 0.005 %  ophthalmic solution Place 1 drop into both eyes at bedtime. Reported on 04/04/2016    . Naftifine HCl 2 % CREA apply to affected area twice a day 60 g 11  . nitroGLYCERIN (NITROSTAT) 0.3 MG SL tablet Place 1 tablet (0.3 mg total) under the tongue as needed for chest pain (not to exceed 3 tablets in one day.). 25 tablet 2  . omeprazole (PRILOSEC) 20 MG capsule take 1 capsule by mouth twice a day before meals 60 capsule 5  . ONE TOUCH LANCETS MISC Check BS twice daily. 200 each 3  . ONE TOUCH ULTRA TEST test strip CHECK BLOOD SUGAR TWO TIMES DAILY AS DIRECTED 200 each 6   No current facility-administered medications for this visit.     Functional Status:  In your present state of health, do you have any difficulty performing the following activities: 11/10/2016 08/18/2016  Hearing? N N  Vision? N N  Difficulty concentrating or making decisions? N Y  Walking or climbing stairs? N N  Dressing or bathing? N N  Doing errands, shopping? N N  Preparing Food and eating ? - N  Using the Toilet? - N  In the past six months, have you accidently leaked urine? - N  Do you have problems with loss of bowel control? - N  Managing your Medications? - N  Managing your Finances? -  N  Housekeeping or managing your Housekeeping? - N  Some recent data might be hidden    Fall/Depression Screening: PHQ 2/9 Scores 11/22/2016 08/18/2016 07/12/2016 04/14/2016 08/07/2015 06/17/2015 12/29/2014  PHQ - 2 Score 0 0 0 0 0 2 0  PHQ- 9 Score - - - - - 8 -    Assessment:  Patient reports consistent blood pressure and diabetes readings. Patient endorses adherence to low sodium and diabetic diet.  Plan:  Telephone contact in the next 28 days to assess for community care coordination needs.

## 2016-12-23 ENCOUNTER — Other Ambulatory Visit (HOSPITAL_COMMUNITY): Payer: Self-pay | Admitting: *Deleted

## 2016-12-26 ENCOUNTER — Encounter (HOSPITAL_COMMUNITY)
Admission: RE | Admit: 2016-12-26 | Discharge: 2016-12-26 | Disposition: A | Discharge status: Home / Self Care | Payer: Medicare Other | Source: Ambulatory Visit | Attending: Nephrology | Admitting: Nephrology

## 2016-12-26 DIAGNOSIS — I5042 Chronic combined systolic (congestive) and diastolic (congestive) heart failure: Secondary | ICD-10-CM | POA: Diagnosis not present

## 2016-12-26 DIAGNOSIS — I130 Hypertensive heart and chronic kidney disease with heart failure and stage 1 through stage 4 chronic kidney disease, or unspecified chronic kidney disease: Secondary | ICD-10-CM | POA: Diagnosis not present

## 2016-12-26 DIAGNOSIS — E1122 Type 2 diabetes mellitus with diabetic chronic kidney disease: Secondary | ICD-10-CM | POA: Insufficient documentation

## 2016-12-26 DIAGNOSIS — D631 Anemia in chronic kidney disease: Secondary | ICD-10-CM | POA: Diagnosis not present

## 2016-12-26 DIAGNOSIS — N183 Chronic kidney disease, stage 3 (moderate): Secondary | ICD-10-CM | POA: Diagnosis not present

## 2016-12-26 MED ORDER — SODIUM CHLORIDE 0.9 % IV SOLN
510.0000 mg | INTRAVENOUS | Status: DC
  Administered 2016-12-26: 09:00:00 510 mg via INTRAVENOUS
  Filled 2016-12-26: qty 17

## 2016-12-26 NOTE — Discharge Instructions (Signed)

## 2016-12-29 ENCOUNTER — Ambulatory Visit (INDEPENDENT_AMBULATORY_CARE_PROVIDER_SITE_OTHER): Payer: Medicare Other | Admitting: Podiatry

## 2016-12-29 DIAGNOSIS — L97521 Non-pressure chronic ulcer of other part of left foot limited to breakdown of skin: Secondary | ICD-10-CM

## 2016-12-29 DIAGNOSIS — L03032 Cellulitis of left toe: Secondary | ICD-10-CM | POA: Diagnosis not present

## 2016-12-29 DIAGNOSIS — L02612 Cutaneous abscess of left foot: Secondary | ICD-10-CM

## 2016-12-29 MED ORDER — SILVER SULFADIAZINE 1 % EX CREA: 1.0000 "application " | TOPICAL_CREAM | Freq: Every day | CUTANEOUS | 0 refills | Status: DC

## 2016-12-29 MED ORDER — CEPHALEXIN 500 MG PO CAPS: 500.0000 mg | ORAL_CAPSULE | Freq: Three times a day (TID) | ORAL | 2 refills | Status: DC

## 2016-12-29 NOTE — Progress Notes (Signed)
Subjective: 66 year old male presents to the office today for concerns of an ulcer to the left big toe which has been ongoing for the last couple of days. He was wearing a cushion over the toe and he thinks this may have been rubbing. He states that it has an odor to the wound. Denies any redness or red streaking. Mild swelling. Denies any systemic complaints such as fevers, chills, nausea, vomiting. No acute changes since last appointment, and no other complaints at this time.   Objective: AAO x3, NAD DP/PT pulses palpable bilaterally, CRT less than 3 seconds On the dorsal aspect of the left hallux on the dorsal IPJ is an ulceration measuring 1.5 x 1.5 cm with a granular wound base. There is mild hyperkeratotic periwound. There is no probing to bone, undermining or tunneling. There is no surrounding erythema, ascending cellulitis, fluctuance, crepitus, malodor. There is minimal edema to the toe. Mild malodor is present and this appears to havea macerated smell.  No open lesions or pre-ulcerative lesions.  No pain with calf compression, swelling, warmth, erythema  Assessment: Left hallux ulce  Plan: -All treatment options discussed with the patient including all alternatives, risks, complications.  -Wound was sharply debrided without complications. Silvadene was applied followed by a bandage. Continue with daily dressing changes. -Prescribed Keflex. -Surgical shoe dispensed. -If not significant healing next appointment, order arterial studies.  -Monitor for any clinical signs or symptoms of infection and directed to call the office immediately should any occur or go to the ER. -RTC in 2 weeks or sooner if needed.  -Patient encouraged to call the office with any questions, concerns, change in symptoms.   Celesta Gentile, DPM

## 2016-12-30 ENCOUNTER — Ambulatory Visit: Payer: Medicare Other | Admitting: Podiatry

## 2016-12-30 ENCOUNTER — Other Ambulatory Visit (HOSPITAL_COMMUNITY): Payer: Self-pay | Admitting: *Deleted

## 2017-01-02 ENCOUNTER — Encounter (HOSPITAL_COMMUNITY)
Admission: RE | Admit: 2017-01-02 | Discharge: 2017-01-02 | Disposition: A | Discharge status: Home / Self Care | Payer: Medicare Other | Source: Ambulatory Visit | Attending: Nephrology | Admitting: Nephrology

## 2017-01-02 DIAGNOSIS — N183 Chronic kidney disease, stage 3 (moderate): Secondary | ICD-10-CM | POA: Diagnosis not present

## 2017-01-02 DIAGNOSIS — D631 Anemia in chronic kidney disease: Secondary | ICD-10-CM | POA: Insufficient documentation

## 2017-01-02 MED ORDER — SODIUM CHLORIDE 0.9 % IV SOLN
510.0000 mg | INTRAVENOUS | Status: DC
  Administered 2017-01-02: 09:00:00 510 mg via INTRAVENOUS
  Filled 2017-01-02: qty 17

## 2017-01-05 ENCOUNTER — Telehealth: Payer: Self-pay | Admitting: Podiatry

## 2017-01-05 ENCOUNTER — Other Ambulatory Visit: Payer: Self-pay | Admitting: Licensed Clinical Social Worker

## 2017-01-05 NOTE — Telephone Encounter (Signed)
Pt called and has some questions about his blister on his toe that he was seen for last week by Dr Jacqualyn Posey.He also left a voicemail.

## 2017-01-05 NOTE — Telephone Encounter (Signed)
I spoke with pt and he informed that he got an appt with the [facility]. I apologized for not calling, my voicemail was not allowing me to pull messages off.

## 2017-01-05 NOTE — Patient Outreach (Signed)
[place] [facility]) Care Management  01/05/2017  Jacob [name] Aug 09, 1951 [id number]   Assessment- CSW received notification from [place] and CSW that transportation has been successfully arranged for patient through [employer] for his appointment tomorrow.   CSW completed call and notified patient that [employer] will be at his residence to pick him up tomorrow at 10:00 am and will give him a card for pick up to call once he is ready to be transported home from appointment.  Plan-All goals have been met. No further social work needs. CSW will close case.  Eula Fried, BSW, MSW, [email or web address] Phone: [phone] Fax: [phone]

## 2017-01-10 ENCOUNTER — Telehealth: Payer: Self-pay | Admitting: *Deleted

## 2017-01-10 MED ORDER — PREGABALIN 75 MG PO CAPS: ORAL_CAPSULE | ORAL | 3 refills | Status: DC

## 2017-01-10 NOTE — Telephone Encounter (Signed)
Patient notified and agreed. Medication list updated. Rx printed and faxed to pharmacy.

## 2017-01-10 NOTE — Telephone Encounter (Signed)
Stop gabapentin. Start Lyrica 75 mg (60) One twice daily to help neuropathy pains.

## 2017-01-10 NOTE — Telephone Encounter (Signed)
Patient called and stated that he doesn't feel like the Gabapentin is working on his feet pain and wants something stronger. Been eating sugar free popsicles  and wonders if there is something in them that can be making his feet hurt. Patient is currently taking 300mg  of Gabapentin three times daily. (Dr. [name] Pt) Please Advise.

## 2017-01-11 ENCOUNTER — Other Ambulatory Visit: Payer: Self-pay | Admitting: Internal Medicine

## 2017-01-11 NOTE — Telephone Encounter (Signed)
Patient stated that he was going over the side effects of the Lyrica and was concerned with the precautions of heart failure and kidney disease cautions with his history. Patient stated that he Just wants to make sure its ok to take. Took his first dose this morning. Please Advise.

## 2017-01-11 NOTE — Telephone Encounter (Signed)
Although we want to be careful about use of this drug, I believe it is reasonable to try it since he did not do wlel with the other alternative of gabapentin. He is already scheduled for lab and return to see Dr. [name] in late May 2018.

## 2017-01-11 NOTE — Telephone Encounter (Signed)
Patient notified and agreed.  

## 2017-01-12 ENCOUNTER — Encounter (HOSPITAL_BASED_OUTPATIENT_CLINIC_OR_DEPARTMENT_OTHER): Payer: Medicare Other | Attending: Nurse Practitioner

## 2017-01-12 DIAGNOSIS — L97522 Non-pressure chronic ulcer of other part of left foot with fat layer exposed: Secondary | ICD-10-CM | POA: Diagnosis not present

## 2017-01-12 DIAGNOSIS — I110 Hypertensive heart disease with heart failure: Secondary | ICD-10-CM | POA: Diagnosis not present

## 2017-01-12 DIAGNOSIS — I2510 Atherosclerotic heart disease of native coronary artery without angina pectoris: Secondary | ICD-10-CM | POA: Diagnosis not present

## 2017-01-12 DIAGNOSIS — M109 Gout, unspecified: Secondary | ICD-10-CM | POA: Insufficient documentation

## 2017-01-12 DIAGNOSIS — Z87891 Personal history of nicotine dependence: Secondary | ICD-10-CM | POA: Diagnosis not present

## 2017-01-12 DIAGNOSIS — I252 Old myocardial infarction: Secondary | ICD-10-CM | POA: Diagnosis not present

## 2017-01-12 DIAGNOSIS — I509 Heart failure, unspecified: Secondary | ICD-10-CM | POA: Diagnosis not present

## 2017-01-12 DIAGNOSIS — E1140 Type 2 diabetes mellitus with diabetic neuropathy, unspecified: Secondary | ICD-10-CM | POA: Insufficient documentation

## 2017-01-12 DIAGNOSIS — E11621 Type 2 diabetes mellitus with foot ulcer: Secondary | ICD-10-CM | POA: Insufficient documentation

## 2017-01-12 DIAGNOSIS — J449 Chronic obstructive pulmonary disease, unspecified: Secondary | ICD-10-CM | POA: Insufficient documentation

## 2017-01-12 DIAGNOSIS — E1142 Type 2 diabetes mellitus with diabetic polyneuropathy: Secondary | ICD-10-CM | POA: Diagnosis not present

## 2017-01-12 DIAGNOSIS — L97521 Non-pressure chronic ulcer of other part of left foot limited to breakdown of skin: Secondary | ICD-10-CM | POA: Diagnosis not present

## 2017-01-12 DIAGNOSIS — L8993 Pressure ulcer of unspecified site, stage 3: Secondary | ICD-10-CM | POA: Diagnosis not present

## 2017-01-12 DIAGNOSIS — D649 Anemia, unspecified: Secondary | ICD-10-CM | POA: Diagnosis not present

## 2017-01-12 DIAGNOSIS — L8992 Pressure ulcer of unspecified site, stage 2: Secondary | ICD-10-CM | POA: Diagnosis not present

## 2017-01-13 ENCOUNTER — Ambulatory Visit: Payer: Medicare Other | Admitting: Podiatry

## 2017-01-16 ENCOUNTER — Ambulatory Visit (INDEPENDENT_AMBULATORY_CARE_PROVIDER_SITE_OTHER): Payer: Medicare Other | Admitting: Podiatry

## 2017-01-16 ENCOUNTER — Telehealth: Payer: Self-pay | Admitting: *Deleted

## 2017-01-16 DIAGNOSIS — L608 Other nail disorders: Secondary | ICD-10-CM | POA: Diagnosis not present

## 2017-01-16 DIAGNOSIS — B351 Tinea unguium: Secondary | ICD-10-CM | POA: Diagnosis not present

## 2017-01-16 DIAGNOSIS — E0843 Diabetes mellitus due to underlying condition with diabetic autonomic (poly)neuropathy: Secondary | ICD-10-CM | POA: Diagnosis not present

## 2017-01-16 DIAGNOSIS — M79672 Pain in left foot: Secondary | ICD-10-CM | POA: Diagnosis not present

## 2017-01-16 DIAGNOSIS — L851 Acquired keratosis [keratoderma] palmaris et plantaris: Secondary | ICD-10-CM

## 2017-01-16 DIAGNOSIS — M79671 Pain in right foot: Secondary | ICD-10-CM | POA: Diagnosis not present

## 2017-01-16 DIAGNOSIS — L84 Corns and callosities: Secondary | ICD-10-CM | POA: Diagnosis not present

## 2017-01-16 DIAGNOSIS — E08621 Diabetes mellitus due to underlying condition with foot ulcer: Secondary | ICD-10-CM | POA: Diagnosis not present

## 2017-01-16 DIAGNOSIS — M79609 Pain in unspecified limb: Secondary | ICD-10-CM | POA: Diagnosis not present

## 2017-01-16 DIAGNOSIS — I70245 Atherosclerosis of native arteries of left leg with ulceration of other part of foot: Secondary | ICD-10-CM | POA: Diagnosis not present

## 2017-01-16 DIAGNOSIS — L97522 Non-pressure chronic ulcer of other part of left foot with fat layer exposed: Secondary | ICD-10-CM | POA: Diagnosis not present

## 2017-01-16 DIAGNOSIS — L97509 Non-pressure chronic ulcer of other part of unspecified foot with unspecified severity: Secondary | ICD-10-CM | POA: Diagnosis not present

## 2017-01-16 DIAGNOSIS — L603 Nail dystrophy: Secondary | ICD-10-CM | POA: Diagnosis not present

## 2017-01-16 DIAGNOSIS — E1143 Type 2 diabetes mellitus with diabetic autonomic (poly)neuropathy: Secondary | ICD-10-CM

## 2017-01-16 NOTE — Telephone Encounter (Signed)
Patient called and stated that he had weight gain. Stated that he was 183 on 12/26/16 and now today he is 188. Patient is wondering if the weight gain is coming from the Lyrica. Patient states he feels good but was just concerned with the weight gain. Patient does take Lasix.  Please Advise.

## 2017-01-16 NOTE — Telephone Encounter (Signed)
Weight gain is very likely due to Lyrica.

## 2017-01-17 ENCOUNTER — Other Ambulatory Visit: Payer: Self-pay | Admitting: Internal Medicine

## 2017-01-17 ENCOUNTER — Encounter: Payer: Self-pay | Admitting: Cardiology

## 2017-01-17 ENCOUNTER — Telehealth: Payer: Self-pay | Admitting: Cardiovascular Disease

## 2017-01-17 ENCOUNTER — Ambulatory Visit (INDEPENDENT_AMBULATORY_CARE_PROVIDER_SITE_OTHER): Payer: Medicare Other | Admitting: Cardiology

## 2017-01-17 VITALS — BP 148/68 | HR 78 | Ht 74.0 in | Wt 195.8 lb

## 2017-01-17 DIAGNOSIS — B20 Human immunodeficiency virus [HIV] disease: Secondary | ICD-10-CM

## 2017-01-17 DIAGNOSIS — I2510 Atherosclerotic heart disease of native coronary artery without angina pectoris: Secondary | ICD-10-CM

## 2017-01-17 DIAGNOSIS — R0602 Shortness of breath: Secondary | ICD-10-CM | POA: Diagnosis not present

## 2017-01-17 DIAGNOSIS — Z79899 Other long term (current) drug therapy: Secondary | ICD-10-CM | POA: Diagnosis not present

## 2017-01-17 DIAGNOSIS — I5023 Acute on chronic systolic (congestive) heart failure: Secondary | ICD-10-CM | POA: Diagnosis not present

## 2017-01-17 DIAGNOSIS — I255 Ischemic cardiomyopathy: Secondary | ICD-10-CM

## 2017-01-17 MED ORDER — FUROSEMIDE 40 MG PO TABS: 80.0000 mg | ORAL_TABLET | Freq: Two times a day (BID) | ORAL | 1 refills | Status: DC | PRN

## 2017-01-17 NOTE — Progress Notes (Signed)
Cardiology Office Note    Date:  01/17/2017   ID:  Jacob [name], DOB Feb 04, 1951, MRN [id number]  PCP:  Gildardo Cranker, DO  Cardiologist:   Candee Furbish, MD   Chief Complaint  Patient presents with  . Weight Gain    History of Present Illness:  Jacob [name] is a 66 y.o. male with coronary artery disease status post CABG in 2004 with HIV, hepatitis C with ejection fraction of 24%. Declined ICD previously.  He comes in today with increasing weight gain, possible fluid overload. Were asked to see for Dr.Nishan.  183 prior now 189. Recent start neuropathy medication, Lyrica. No dyspnea. No CP.   Took lasix 80 this AM and 40 PM for Sunday and today.   Past Medical History:  Diagnosis Date  . Arthritis   . Cataracts, bilateral    immature  . CHF (congestive heart failure) (HCC)    not on any meds  . Chronic back pain    stenosis  . Chronic kidney disease, unspecified   . Coronary atherosclerosis of artery bypass graft   . Coronary atherosclerosis of native coronary artery 2005   s/p surgery  . Drug abuse    hx; tested for cocaine as recently as 2/08. says he is not using drugs now - avoided defib. for this reason   . GERD (gastroesophageal reflux disease)    takes OTC meds as needed  . Glaucoma    uses eye drops daily  . Gout, unspecified    takes Allopurinol daily as well as Colchicine-if needed  . Hepatitis 1967   Hep C  . History of colon polyps    benign  . HTN (hypertension)    takes Coreg,Imdur.and Apresoline daily  . Human immunodeficiency virus (HIV) disease    takes Genvoya daily  . Hyperlipidemia    takes Atorvastatin daily  . Ischemic cardiomyopathy   . Muscle spasm    takes Zanaflex as needed  . Nocturia   . Peripheral neuropathy (HCC)    takes gabapentin daily  . Pneumonia    hx of  . Shortness of breath dyspnea    rarely but if notices it then with exertion  . Syphilis, unspecified   . Type II or unspecified type diabetes mellitus without  mention of complication, not stated as uncontrolled 2004   Lantus daily.Average fasting blood sugar 125-199    Past Surgical History:  Procedure Laterality Date  . COLONOSCOPY  2013   Dr.John Henrene Pastor   . CORONARY ARTERY BYPASS GRAFT    . laproscopic cholecystectomy  8/07  . left intertrochanteric hip fracture  2004   s/p intermedullary nail placement 2/08  . left transverse mandibular fracture  10/05  . LUMBAR LAMINECTOMY/DECOMPRESSION MICRODISCECTOMY N/A 02/29/2016   Procedure: Left L4-5 Lateral Recess Decompression, Removal Extradural Intraspinal Facet Cyst;  Surgeon: Marybelle Killings, MD;  Location: [facility];  Service: Orthopedics;  Laterality: N/A;    Current Medications: Outpatient Medications Prior to Visit  Medication Sig Dispense Refill  . allopurinol (ZYLOPRIM) 100 MG tablet take 1 tablet by mouth once daily 30 tablet 3  . ANORO ELLIPTA 62.5-25 MCG/INH AEPB INHALE 1 PUFF INTO THE LUNGS DAILY 60 each 5  . aspirin EC 81 MG tablet Take 81 mg by mouth daily.    [name] atorvastatin (LIPITOR) 20 MG tablet Take 1 tablet (20 mg total) by mouth daily. 30 tablet 3  . B-D UF III MINI PEN NEEDLES 31G X 5 MM MISC USE 4 TIMES  DAILY 150 each 11  . carvedilol (COREG) 25 MG tablet take 1 tablet by mouth twice a day with meals 180 tablet 1  . cephALEXin (KEFLEX) 500 MG capsule Take 1 capsule (500 mg total) by mouth 3 (three) times daily. 30 capsule 2  . Choline Fenofibrate 135 MG capsule Take 1 capsule (135 mg total) by mouth daily. 30 capsule 6  . colchicine 0.6 MG tablet Take 0.6 mg by mouth daily as needed (gout). Reported on 04/04/2016    . elvitegravir-cobicistat-emtricitabine-tenofovir (GENVOYA) 150-150-200-10 MG TABS tablet TAKE 1 TABLET BY MOUTH DAILY WITH BREAKFAST. 30 tablet 11  . Glucosamine-Chondroit-Vit C-Mn (GLUCOSAMINE CHONDR 1500 COMPLX PO) Take 2 tablets by mouth daily. Reported on 04/04/2016    . HUMALOG KWIKPEN 100 UNIT/ML KiwkPen inject 12 units subcutaneously three times a day before meals  15 mL 5  . hydrALAZINE (APRESOLINE) 25 MG tablet take 1 tablet by mouth three times a day 90 tablet 8  . isosorbide mononitrate (IMDUR) 30 MG 24 hr tablet Take 30 mg by mouth daily. Reported on 04/04/2016    . LANTUS SOLOSTAR 100 UNIT/ML Solostar Pen Inject 70 Units into the skin every morning. 35 units on each side  0  . latanoprost (XALATAN) 0.005 % ophthalmic solution Place 1 drop into both eyes at bedtime. Reported on 04/04/2016    . Naftifine HCl 2 % CREA apply to affected area twice a day 60 g 11  . nitroGLYCERIN (NITROSTAT) 0.3 MG SL tablet Place 1 tablet (0.3 mg total) under the tongue as needed for chest pain (not to exceed 3 tablets in one day.). 25 tablet 2  . ONE TOUCH LANCETS MISC Check BS twice daily. 200 each 3  . ONE TOUCH ULTRA TEST test strip CHECK BLOOD SUGAR TWO TIMES DAILY AS DIRECTED 200 each 6  . pregabalin (LYRICA) 75 MG capsule Take one capsule by mouth twice daily to help neuropathy pains. 60 capsule 3  . furosemide (LASIX) 40 MG tablet Take 1 tablet (40 mg total) by mouth as needed for fluid or edema. 30 tablet 2  . GENVOYA 150-150-200-10 MG TABS tablet TAKE 1 TABLET BY MOUTH DAILY WITH BREAKFAST. (Patient not taking: Reported on 01/17/2017) 30 tablet 4  . omeprazole (PRILOSEC) 20 MG capsule take 1 capsule by mouth twice a day before meals (Patient not taking: Reported on 01/17/2017) 60 capsule 5  . silver sulfADIAZINE (SILVADENE) 1 % cream Apply 1 application topically daily. (Patient not taking: Reported on 01/17/2017) 50 g 0  . traMADol (ULTRAM) 50 MG tablet TAKE 1 TABLET (50 MG TOTAL) BY MOUTH EVERY 6 HOURS AS NEEDED  0   No facility-administered medications prior to visit.      Allergies:   Amphetamines   Social History   Social History  . Marital status: Single    Spouse name: N/A  . Number of children: 1  . Years of education: N/A   Occupational History  . retired   . part time- church    Social History Main Topics  . Smoking status: Current Every Day  Smoker    Packs/day: 0.50    Years: 41.00    Types: Cigarettes  . Smokeless tobacco: Never Used  . Alcohol use 3.6 oz/week    6 Standard drinks or equivalent per week     Comment: occ.  . Drug use: No     Comment: hx of crack/cocaine 27yrs ago  . Sexual activity: Not Currently     Comment: declined condoms  Other Topics Concern  . None   Social History Narrative   Retired.       As of 05/2015:   Diet: No salt    Caffeine   Married: Single    House: Condo, 2 stories, 1 person (self)    Pets: No    Current/Past profession: N/A   Exercise: walks daily    Living Will: Yes    DNR   POA/HPOA: No            Family History:  The patient's family history includes Alzheimer's disease in his mother and sister; Diabetes in his brother, brother, and sister; Drug abuse in his brother; Heart attack in his brother; Heart failure in his father; Hypertension in his brother and father; Stroke in his sister.   ROS:   Please see the history of present illness.    ROS All other systems reviewed and are negative.   PHYSICAL EXAM:   VS:  BP (!) 154/76   Pulse 78   Ht 6\' 2"  (1.88 m)   Wt 195 lb 12.8 oz (88.8 kg)   BMI 25.14 kg/m    GEN: Well nourished, well developed, in no acute distress  HEENT: normal  Neck: Mild JVD, carotid bruits, or masses Cardiac: RRR; no murmurs, rubs, or gallops,no significant peripheral edema. Mild pedal edema at night he states.  Respiratory:  clear to auscultation bilaterally, normal work of breathing GI: soft, nontender, nondistended, + BS, protuberant abdomen MS: no deformity or atrophy  Skin: warm and dry, no rash Neuro:  Alert and Oriented x 3, Strength and sensation are intact Psych: euthymic mood, full affect  Wt Readings from Last 3 Encounters:  01/17/17 195 lb 12.8 oz (88.8 kg)  12/26/16 183 lb (83 kg)  12/07/16 184 lb 6.4 oz (83.6 kg)      Studies/Labs Reviewed:   EKG:  No new EKG. Prior EKG shows inferior Q waves with T-wave inversion  in 1, aVL, V4 and 6. Normal sinus rhythm.  Recent Labs: 11/10/2016: B Natriuretic Peptide 740.5; TSH 0.540 11/11/2016: ALT 24 11/18/2016: Hemoglobin 10.2; Platelets 304 11/29/2016: BUN 37; Creat 2.43; Potassium 4.9; Sodium 138   Lipid Panel    Component Value Date/Time   CHOL 162 08/18/2016 0850   CHOL 121 08/07/2015 1041   TRIG 165 (H) 08/18/2016 0850   HDL 39 (L) 08/18/2016 0850   HDL 32 (L) 08/07/2015 1041   CHOLHDL 4.2 08/18/2016 0850   VLDL 33 (H) 08/18/2016 0850   LDLCALC 90 08/18/2016 0850   LDLCALC 15 08/07/2015 1041    Additional studies/ records that were reviewed today include:  Prior office notes, lab work, echocardiogram reviewed    ASSESSMENT:    1. Acute on chronic systolic heart failure ([place])   2. Shortness of breath   3. Encounter for long-term current use of medication   4. Ischemic cardiomyopathy   5. Coronary artery disease involving native coronary artery of native heart without angina pectoris      PLAN:  In order of problems listed above:  Acute on chronic systolic and diastolic heart failure  - Weight gain has increased.  - Has been taking Lasix 80/40 over the past 2 days. His most recent creatinine is 2.43. It was 1.95 in January.  - Currently on beta blocker at goal dosage.  - He is not on ACE inhibitor because of his chronic kidney disease stage IV. He sees a nephrologist.  - We will check a basic metabolic profile and BNP  today. I have asked him to take Lasix 80 twice a day over the next 4 days and to return to clinic in 1 week for reassessment. I want to make sure that his weight is decreasing effectively. We also talked about decreasing overall fluid intake and he does admit that he eats several ice pops sugar-free daily, 5 or 6. He stays away from salt.  - Thankfully, he is not having any significant shortness of breath at this time. NYHA class 2 symptoms. Mild JVD noted in upright position above clavicle. No crackles.  - He seems to accumulate  most fluid in his belly.  - He states that this fluid increase seems to correlate with new start Lyrica for his peripheral neuropathy. He may wish to discontinue this to see how he does.  CAD  - Stable, no anginal symptoms. Medications reviewed.  Hyperlipidemia  - Statin.  Medication Adjustments/Labs and Tests Ordered: Current medicines are reviewed at length with the patient today.  Concerns regarding medicines are outlined above.  Medication changes, Labs and Tests ordered today are listed in the Patient Instructions below. Patient Instructions  Medication Instructions:  Please increase Furosemide to 80 mg twice a day for 4 days. Continue all other medications as listed.  Labwork: Please have blood work today (BMP,BNP)  Follow-Up: Follow up in approximately 1 week with Dr [name] or NP/PA.   If you need a refill on your cardiac medications before your next appointment, please call your pharmacy.  Thank you for choosing [facility]!!        Signed, Candee Furbish, MD  01/17/2017 2:09 PM    [facility] [place], [place], [place]  [address] Phone: [phone]; Fax: [phone]

## 2017-01-17 NOTE — Telephone Encounter (Signed)
I spoke to patient and advised him he needs to be seen and have labs drawn.  He states he is going out of town for a funeral and needs to be seen ASAP.  I advised him that I sch him w/ Dr. Marlou Porch (no APP appts today).  He states Dr. Marlou Porch if fine if Dr. [name] is not available. Patient agrees to at at 1345 appt today w/ Dr. Marlou Porch.

## 2017-01-17 NOTE — Telephone Encounter (Signed)
New message       Pt states that his wt is now 189lbs--- up from 183 lbs. He states he took an extra lasix pill this am.  Pt denies sob.  Please advise

## 2017-01-17 NOTE — Telephone Encounter (Signed)
Called patient to let know that Lyrica is most likely causing weight gain.  Patient understood and stated that he has made an appointment with Dr. Floreen Comber his heart doctor, today at 1:45pm.

## 2017-01-17 NOTE — Telephone Encounter (Signed)
I called and spoke to patient.  He states his weight last Tuesday was 183#, today it is 189#.  He denies swelling and SOB at this time and states he has not had symptoms in the past week.  He admits taking Lasix 40 mg QD regularly.  He states he took Lasix 80 mg on Sunday and this morning.  He states Lasix has not made him urinate as it normally does with increased dose.  Patient states he started taking Lyrica 75 mg BID for neuropathy last Tuesday, which correlates with weight gain.  He states he thinks the weight gain is from the Lyrica instead of fluid, but would like your opinion.  I have made him aware that you are out of the office this week and we will contact him when you return.  He was advised to call back if weight gain continues or he becomes symptomatic.  He voiced understanding and agreed with plan.

## 2017-01-17 NOTE — Telephone Encounter (Signed)
No way to know without seeing him have him come in and see PA and check BMET and BNP

## 2017-01-17 NOTE — Patient Instructions (Signed)
Medication Instructions:  Please increase Furosemide to 80 mg twice a day for 4 days. Continue all other medications as listed.  Labwork: Please have blood work today (BMP,BNP)  Follow-Up: Follow up in approximately 1 week with Dr [name] or NP/PA.   If you need a refill on your cardiac medications before your next appointment, please call your pharmacy.  Thank you for choosing [facility]!!

## 2017-01-17 NOTE — Telephone Encounter (Signed)
Noted.  Dr Marlou Porch is aware and will see the patient as scheduled.

## 2017-01-18 ENCOUNTER — Other Ambulatory Visit: Payer: Self-pay

## 2017-01-18 LAB — BASIC METABOLIC PANEL
BUN/Creatinine Ratio: 17 (ref 10–24)
BUN: 40 mg/dL — ABNORMAL HIGH (ref 8–27)
CHLORIDE: 105 mmol/L (ref 96–106)
CO2: 21 mmol/L (ref 18–29)
Calcium: 8.9 mg/dL (ref 8.6–10.2)
Creatinine, Ser: 2.4 mg/dL — ABNORMAL HIGH (ref 0.76–1.27)
GFR, EST AFRICAN AMERICAN: 31 mL/min/{1.73_m2} — AB (ref >59)
GFR, EST NON AFRICAN AMERICAN: 27 mL/min/{1.73_m2} — AB (ref >59)
Glucose: 163 mg/dL — ABNORMAL HIGH (ref 65–99)
POTASSIUM: 4.6 mmol/L (ref 3.5–5.2)
SODIUM: 142 mmol/L (ref 134–144)

## 2017-01-18 LAB — PRO B NATRIURETIC PEPTIDE: NT-Pro BNP: 1640 pg/mL — ABNORMAL HIGH (ref 0–376)

## 2017-01-18 NOTE — Patient Outreach (Signed)
    Telephone call received from patient to advise this RNCM of his appointment with eye doctor on Feb 22, 2017. Patient also advises he will need transportation to this appointment as it was rescheduled from an earlier date.  This RNCM advised Vida Roller, [facility] CM assistant of request made by patient.  Plan: Return patient's case to discharged status.

## 2017-01-20 DIAGNOSIS — E11621 Type 2 diabetes mellitus with foot ulcer: Secondary | ICD-10-CM | POA: Diagnosis not present

## 2017-01-20 DIAGNOSIS — E1140 Type 2 diabetes mellitus with diabetic neuropathy, unspecified: Secondary | ICD-10-CM | POA: Diagnosis not present

## 2017-01-20 DIAGNOSIS — I110 Hypertensive heart disease with heart failure: Secondary | ICD-10-CM | POA: Diagnosis not present

## 2017-01-20 DIAGNOSIS — L8992 Pressure ulcer of unspecified site, stage 2: Secondary | ICD-10-CM | POA: Diagnosis not present

## 2017-01-20 DIAGNOSIS — L8993 Pressure ulcer of unspecified site, stage 3: Secondary | ICD-10-CM | POA: Diagnosis not present

## 2017-01-20 DIAGNOSIS — D649 Anemia, unspecified: Secondary | ICD-10-CM | POA: Diagnosis not present

## 2017-01-20 DIAGNOSIS — I2510 Atherosclerotic heart disease of native coronary artery without angina pectoris: Secondary | ICD-10-CM | POA: Diagnosis not present

## 2017-01-20 DIAGNOSIS — M109 Gout, unspecified: Secondary | ICD-10-CM | POA: Diagnosis not present

## 2017-01-20 DIAGNOSIS — I252 Old myocardial infarction: Secondary | ICD-10-CM | POA: Diagnosis not present

## 2017-01-20 DIAGNOSIS — L97522 Non-pressure chronic ulcer of other part of left foot with fat layer exposed: Secondary | ICD-10-CM | POA: Diagnosis not present

## 2017-01-20 DIAGNOSIS — I509 Heart failure, unspecified: Secondary | ICD-10-CM | POA: Diagnosis not present

## 2017-01-20 DIAGNOSIS — J449 Chronic obstructive pulmonary disease, unspecified: Secondary | ICD-10-CM | POA: Diagnosis not present

## 2017-01-20 NOTE — Progress Notes (Signed)
   Subjective:  Patient with a history of diabetes mellitus presents today for follow-up evaluation of an ulcer to the left great toe. Patient is seeing wound care, however she just needs a dressing change today with possible debridement. Patient's right supplies with him today. Patient also complains of thickened, elongated, fungal nails to the bilateral lower extremities.  Objective/Physical Exam General: The patient is alert and oriented x3 in no acute distress.  Dermatology:  Wound #1 noted to the left great toe dorsal measuring [phone] cm (LxWxD).   Wound #2 noted to the left great toe plantar measuring [phone] cm.  To the noted ulceration(s), there is no eschar. There is a moderate amount of slough, fibrin, and necrotic tissue noted. Granulation tissue and wound base is red. There is a minimal amount of serosanguineous drainage noted. There is no exposed bone muscle-tendon ligament or joint. There is no malodor. Periwound integrity is intact. Skin is warm, dry and supple bilateral lower extremities. Painful hyperkeratotic callus lesions are also noted to the weightbearing surfaces the bilateral foot 6  Elongated, thickened, dystrophic nails also noted 1-5 bilateral. Painful to palpation.  Symptomatic hyperkeratotic pre-ulcerative lesions also noted to the weightbearing surfaces of the bilateral feet 6  Vascular: Palpable pedal pulses bilaterally. No edema or erythema noted. Capillary refill within normal limits.  Neurological: Epicritic and protective threshold absent bilaterally.   Musculoskeletal Exam: Range of motion within normal limits to all pedal and ankle joints bilateral. Muscle strength 5/5 in all groups bilateral.   Assessment: #1 ulcers left great toe secondary to diabetes mellitus #2 diabetes mellitus w/ peripheral neuropathy  #3 painful porokeratosis bilateral foot 6 #4 onychodystrophy secondary to onychomycosis digits 1-5 bilateral  Plan of Care:  #1  Patient was evaluated. #2 medically necessary excisional debridement including subcutaneous tissue was performed using a tissue nipper and a chisel blade. Excisional debridement of all the necrotic nonviable tissue down to healthy bleeding viable tissue was performed with post-debridement measurements same as pre-. #3 the wound was cleansed and dry sterile dressing applied. #4 mechanical debridement of nails 1-5 bilaterally was performed using a nail nipper without incident or bleeding. #5 excisional debridement of pre-ulcerative callus lesions was performed using a chisel blade without incident or bleeding. #6 return to wound care center for wound management #7 return to clinic when necessary   Edrick Kins, DPM [facility]  Dr. Edrick Kins, [address]                                        [place],  [address]                Office [phone]  Fax [phone]

## 2017-01-22 ENCOUNTER — Other Ambulatory Visit: Payer: Self-pay | Admitting: Cardiovascular Disease

## 2017-01-22 ENCOUNTER — Other Ambulatory Visit: Payer: Self-pay | Admitting: Internal Medicine

## 2017-01-23 NOTE — Telephone Encounter (Signed)
Gildardo Cranker, DO has been refilling this and checking lipids. Okay to refill under Dr [name] or should this be deferred back to Dr [name]? Please advise. Thanks, MI

## 2017-01-26 ENCOUNTER — Ambulatory Visit (INDEPENDENT_AMBULATORY_CARE_PROVIDER_SITE_OTHER): Payer: Medicare Other | Admitting: Cardiology

## 2017-01-26 ENCOUNTER — Encounter: Payer: Self-pay | Admitting: Cardiology

## 2017-01-26 ENCOUNTER — Encounter (HOSPITAL_BASED_OUTPATIENT_CLINIC_OR_DEPARTMENT_OTHER): Payer: Medicare Other | Attending: Internal Medicine

## 2017-01-26 VITALS — BP 142/70 | HR 56 | Ht 74.0 in | Wt 192.1 lb

## 2017-01-26 DIAGNOSIS — M109 Gout, unspecified: Secondary | ICD-10-CM | POA: Diagnosis not present

## 2017-01-26 DIAGNOSIS — R0602 Shortness of breath: Secondary | ICD-10-CM | POA: Diagnosis not present

## 2017-01-26 DIAGNOSIS — I110 Hypertensive heart disease with heart failure: Secondary | ICD-10-CM | POA: Insufficient documentation

## 2017-01-26 DIAGNOSIS — M1990 Unspecified osteoarthritis, unspecified site: Secondary | ICD-10-CM | POA: Insufficient documentation

## 2017-01-26 DIAGNOSIS — N183 Chronic kidney disease, stage 3 unspecified: Secondary | ICD-10-CM

## 2017-01-26 DIAGNOSIS — E11621 Type 2 diabetes mellitus with foot ulcer: Secondary | ICD-10-CM | POA: Insufficient documentation

## 2017-01-26 DIAGNOSIS — I2510 Atherosclerotic heart disease of native coronary artery without angina pectoris: Secondary | ICD-10-CM | POA: Insufficient documentation

## 2017-01-26 DIAGNOSIS — I5023 Acute on chronic systolic (congestive) heart failure: Secondary | ICD-10-CM

## 2017-01-26 DIAGNOSIS — I509 Heart failure, unspecified: Secondary | ICD-10-CM | POA: Insufficient documentation

## 2017-01-26 DIAGNOSIS — I252 Old myocardial infarction: Secondary | ICD-10-CM | POA: Diagnosis not present

## 2017-01-26 DIAGNOSIS — E1140 Type 2 diabetes mellitus with diabetic neuropathy, unspecified: Secondary | ICD-10-CM | POA: Insufficient documentation

## 2017-01-26 DIAGNOSIS — B1920 Unspecified viral hepatitis C without hepatic coma: Secondary | ICD-10-CM | POA: Diagnosis not present

## 2017-01-26 DIAGNOSIS — L8992 Pressure ulcer of unspecified site, stage 2: Secondary | ICD-10-CM | POA: Insufficient documentation

## 2017-01-26 DIAGNOSIS — L84 Corns and callosities: Secondary | ICD-10-CM | POA: Diagnosis not present

## 2017-01-26 DIAGNOSIS — L8993 Pressure ulcer of unspecified site, stage 3: Secondary | ICD-10-CM | POA: Diagnosis not present

## 2017-01-26 DIAGNOSIS — D649 Anemia, unspecified: Secondary | ICD-10-CM | POA: Diagnosis not present

## 2017-01-26 DIAGNOSIS — J449 Chronic obstructive pulmonary disease, unspecified: Secondary | ICD-10-CM | POA: Diagnosis not present

## 2017-01-26 DIAGNOSIS — H409 Unspecified glaucoma: Secondary | ICD-10-CM | POA: Diagnosis not present

## 2017-01-26 DIAGNOSIS — H269 Unspecified cataract: Secondary | ICD-10-CM | POA: Insufficient documentation

## 2017-01-26 DIAGNOSIS — I255 Ischemic cardiomyopathy: Secondary | ICD-10-CM | POA: Diagnosis not present

## 2017-01-26 DIAGNOSIS — E1165 Type 2 diabetes mellitus with hyperglycemia: Secondary | ICD-10-CM | POA: Diagnosis not present

## 2017-01-26 DIAGNOSIS — L97521 Non-pressure chronic ulcer of other part of left foot limited to breakdown of skin: Secondary | ICD-10-CM | POA: Diagnosis not present

## 2017-01-26 DIAGNOSIS — F17210 Nicotine dependence, cigarettes, uncomplicated: Secondary | ICD-10-CM | POA: Diagnosis not present

## 2017-01-26 LAB — BASIC METABOLIC PANEL
BUN / CREAT RATIO: 19 (ref 10–24)
BUN: 56 mg/dL — AB (ref 8–27)
CO2: 24 mmol/L (ref 18–29)
Calcium: 9 mg/dL (ref 8.6–10.2)
Chloride: 104 mmol/L (ref 96–106)
Creatinine, Ser: 2.91 mg/dL — ABNORMAL HIGH (ref 0.76–1.27)
GFR calc Af Amer: 24 mL/min/{1.73_m2} — ABNORMAL LOW (ref >59)
GFR, EST NON AFRICAN AMERICAN: 21 mL/min/{1.73_m2} — AB (ref >59)
Glucose: 116 mg/dL — ABNORMAL HIGH (ref 65–99)
Potassium: 4.9 mmol/L (ref 3.5–5.2)
SODIUM: 142 mmol/L (ref 134–144)

## 2017-01-26 MED ORDER — FUROSEMIDE 40 MG PO TABS: 40.0000 mg | ORAL_TABLET | Freq: Every day | ORAL | 3 refills | Status: DC

## 2017-01-26 NOTE — Progress Notes (Signed)
Cardiology Office Note   Date:  01/26/2017   ID:  Jacob [name], DOB 17-Sep-1951, MRN [id number]  PCP:  Gildardo Cranker, DO  Cardiologist:  Dr. [name]    Chief Complaint  Patient presents with  . Congestive Heart Failure      History of Present Illness: Jacob [name] is a 66 y.o. male who presents for wt gain follow up from last week.  He has a hx of CAD with CABG in 2004, HIV and hepatitis C EF 24%.  Declined ICD.   Last week increased wt and fluid overload.     Lasix increased to 80 mg BID and return to clinic.  Pt tells me prior to this he used lasix only as needed .    Today his wt is down back to within one pound of baseline.  He actually took the lasix 80 mg BID every day, not just the 4 days.  He has no chest pain or SOB.  He does not eat salt but did have 6-8 ice pops per day.  We discussed this is fluid as well.  He believes he is back to his baseline and feels well.  His goal wt is 184.8.  He has stopped the lyrica and gone back to gabapentin.   Past Medical History:  Diagnosis Date  . Arthritis   . Cataracts, bilateral    immature  . CHF (congestive heart failure) (HCC)    not on any meds  . Chronic back pain    stenosis  . Chronic kidney disease, unspecified   . Coronary atherosclerosis of artery bypass graft   . Coronary atherosclerosis of native coronary artery 2005   s/p surgery  . Drug abuse    hx; tested for cocaine as recently as 2/08. says he is not using drugs now - avoided defib. for this reason   . GERD (gastroesophageal reflux disease)    takes OTC meds as needed  . Glaucoma    uses eye drops daily  . Gout, unspecified    takes Allopurinol daily as well as Colchicine-if needed  . Hepatitis 1967   Hep C  . History of colon polyps    benign  . HTN (hypertension)    takes Coreg,Imdur.and Apresoline daily  . Human immunodeficiency virus (HIV) disease    takes Genvoya daily  . Hyperlipidemia    takes Atorvastatin daily  . Ischemic  cardiomyopathy   . Muscle spasm    takes Zanaflex as needed  . Nocturia   . Peripheral neuropathy (HCC)    takes gabapentin daily  . Pneumonia    hx of  . Shortness of breath dyspnea    rarely but if notices it then with exertion  . Syphilis, unspecified   . Type II or unspecified type diabetes mellitus without mention of complication, not stated as uncontrolled 2004   Lantus daily.Average fasting blood sugar 125-199    Past Surgical History:  Procedure Laterality Date  . COLONOSCOPY  2013   Dr.John Henrene Pastor   . CORONARY ARTERY BYPASS GRAFT    . laproscopic cholecystectomy  8/07  . left intertrochanteric hip fracture  2004   s/p intermedullary nail placement 2/08  . left transverse mandibular fracture  10/05  . LUMBAR LAMINECTOMY/DECOMPRESSION MICRODISCECTOMY N/A 02/29/2016   Procedure: Left L4-5 Lateral Recess Decompression, Removal Extradural Intraspinal Facet Cyst;  Surgeon: Marybelle Killings, MD;  Location: [facility];  Service: Orthopedics;  Laterality: N/A;     Current Outpatient Prescriptions  Medication  Sig Dispense Refill  . allopurinol (ZYLOPRIM) 100 MG tablet take 1 tablet by mouth once daily 30 tablet 3  . ANORO ELLIPTA 62.5-25 MCG/INH AEPB INHALE 1 PUFF INTO THE LUNGS DAILY 60 each 5  . aspirin EC 81 MG tablet Take 81 mg by mouth daily.    [name] atorvastatin (LIPITOR) 20 MG tablet take 1 tablet by mouth once daily 30 tablet 11  . B-D UF III MINI PEN NEEDLES 31G X 5 MM MISC USE 4 TIMES DAILY 150 each 11  . carvedilol (COREG) 25 MG tablet take 1 tablet by mouth twice a day with meals 180 tablet 1  . cephALEXin (KEFLEX) 500 MG capsule Take 1 capsule (500 mg total) by mouth 3 (three) times daily. 30 capsule 2  . Choline Fenofibrate 135 MG capsule Take 1 capsule (135 mg total) by mouth daily. 30 capsule 6  . colchicine 0.6 MG tablet take 1 tablet by mouth CONTINUOUS AS NEEDED FOR GOUT 30 tablet 2  . elvitegravir-cobicistat-emtricitabine-tenofovir (GENVOYA) 150-150-200-10 MG TABS tablet  TAKE 1 TABLET BY MOUTH DAILY WITH BREAKFAST. 30 tablet 11  . furosemide (LASIX) 40 MG tablet Take 2 tablets (80 mg total) by mouth 2 (two) times daily as needed for fluid or edema. 120 tablet 1  . gabapentin (NEURONTIN) 300 MG capsule Take 300 mg by mouth 3 (three) times daily.    . Glucosamine-Chondroit-Vit C-Mn (GLUCOSAMINE CHONDR 1500 COMPLX PO) Take 2 tablets by mouth daily. Reported on 04/04/2016    . HUMALOG KWIKPEN 100 UNIT/ML KiwkPen inject 12 units subcutaneously three times a day before meals 15 mL 5  . hydrALAZINE (APRESOLINE) 25 MG tablet take 1 tablet by mouth three times a day 90 tablet 8  . isosorbide mononitrate (IMDUR) 30 MG 24 hr tablet Take 30 mg by mouth daily. Reported on 04/04/2016    . LANTUS SOLOSTAR 100 UNIT/ML Solostar Pen Inject 70 Units into the skin every morning. 35 units on each side  0  . latanoprost (XALATAN) 0.005 % ophthalmic solution Place 1 drop into both eyes at bedtime. Reported on 04/04/2016    . Naftifine HCl 2 % CREA apply to affected area twice a day 60 g 11  . nitroGLYCERIN (NITROSTAT) 0.3 MG SL tablet Place 1 tablet (0.3 mg total) under the tongue as needed for chest pain (not to exceed 3 tablets in one day.). 25 tablet 2  . ONE TOUCH LANCETS MISC Check BS twice daily. 200 each 3  . ONE TOUCH ULTRA TEST test strip CHECK BLOOD SUGAR TWO TIMES DAILY AS DIRECTED 200 each 6   No current facility-administered medications for this visit.     Allergies:   Amphetamines    Social History:  The patient  reports that he has been smoking Cigarettes.  He has a 20.50 pack-year smoking history. He has never used smokeless tobacco. He reports that he drinks about 3.6 oz of alcohol per week . He reports that he does not use drugs.   Family History:  The patient's family history includes Alzheimer's disease in his mother and sister; Diabetes in his brother, brother, and sister; Drug abuse in his brother; Heart attack in his brother; Heart failure in his father;  Hypertension in his brother and father; Stroke in his sister.    ROS:  General:no colds or fevers,  weight down 3 lbs by our scales but at home back to baseline Skin:no rashes or ulcers HEENT:no blurred vision, no congestion CV:see HPI PUL:see HPI GI:no diarrhea constipation or melena,  no indigestion GU:no hematuria, no dysuria MS:no joint pain, no claudication Neuro:no syncope, no lightheadedness Endo:+ diabetes glucose stable, no thyroid disease  Wt Readings from Last 3 Encounters:  01/26/17 192 lb 1.9 oz (87.1 kg)  01/17/17 195 lb 12.8 oz (88.8 kg)  12/26/16 183 lb (83 kg)     PHYSICAL EXAM: VS:  BP (!) 142/70   Pulse (!) 56   Ht 6\' 2"  (1.88 m)   Wt 192 lb 1.9 oz (87.1 kg)   BMI 24.67 kg/m  , BMI Body mass index is 24.67 kg/m. General:Pleasant affect, NAD Skin:Warm and dry, brisk capillary refill HEENT:normocephalic, sclera clear, mucus membranes moist Neck:supple, no JVD, no bruits  Heart:S1S2 RRR without murmur, gallup, rub or click Lungs:clear without rales, rhonchi, or wheezes EXN:TZGY, non tender, + BS, do not palpate liver spleen or masses Ext:tr to 1+ lower ext edema, 2+ pedal pulses, 2+ radial pulses Neuro:alert and oriented X 3, MAE, follows commands, + facial symmetry    EKG:  EKG is NOT ordered today.   Recent Labs: 11/10/2016: B Natriuretic Peptide 740.5; TSH 0.540 11/11/2016: ALT 24 11/18/2016: Hemoglobin 10.2; Platelets 304 01/17/2017: BUN 40; Creatinine, Ser 2.40; NT-Pro BNP 1,640; Potassium 4.6; Sodium 142    Lipid Panel    Component Value Date/Time   CHOL 162 08/18/2016 0850   CHOL 121 08/07/2015 1041   TRIG 165 (H) 08/18/2016 0850   HDL 39 (L) 08/18/2016 0850   HDL 32 (L) 08/07/2015 1041   CHOLHDL 4.2 08/18/2016 0850   VLDL 33 (H) 08/18/2016 0850   LDLCALC 90 08/18/2016 0850   LDLCALC 15 08/07/2015 1041       Other studies Reviewed: Additional studies/ records that were reviewed today include: recent OV note.  ECHO  11/10/2016 ------------------------------------------------------------------- Study Conclusions  - Left ventricle: Septal and inferior wall hypokinesis The cavity   size was mildly dilated. There was moderate concentric   hypertrophy. Systolic function was mildly reduced. The estimated   ejection fraction was in the range of 45% to 50%. Doppler   parameters are consistent with both elevated ventricular   end-diastolic filling pressure and elevated left atrial filling   pressure. - Left atrium: The atrium was mildly dilated. - Atrial septum: No defect or patent foramen ovale was identified.  ASSESSMENT AND PLAN:  1.  Acute on chronic systolic and diastolic HF improved decrease lasix to 40 mg daily.  Previously had been on prn. Will check BMP as well continue BB, no ACE due to kidney disease.  He will keep follow up appt with Dr. [name].  2. CAD no angina, stable.      Current medicines are reviewed with the patient today.  The patient Has no concerns regarding medicines.  The following changes have been made:  See above Labs/ tests ordered today include:see above  Disposition:   FU:  see above  Signed, Cecilie Kicks, NP  01/26/2017 11:19 AM    [facility], [place], [place], [place] Phone: [phone]; Fax: [phone]

## 2017-01-26 NOTE — Patient Instructions (Signed)
Medication Instructions:  1. STARTING TOMORROW 01/27/17 YOU WILL DECREASE LASIX TO 40 MG DAILY; NEW RX HAS BEEN SENT IN  Labwork: TODAY BMET  Testing/Procedures: NONE ORDERED  Follow-Up: KEEP YOUR UPCOMING APPT WITH DR. [name] 02/24/17  Any Other Special Instructions Will Be Listed Below (If Applicable).     If you need a refill on your cardiac medications before your next appointment, please call your pharmacy.

## 2017-01-27 ENCOUNTER — Telehealth: Payer: Self-pay | Admitting: *Deleted

## 2017-01-27 DIAGNOSIS — Z79899 Other long term (current) drug therapy: Secondary | ICD-10-CM

## 2017-01-27 NOTE — Telephone Encounter (Signed)
-----   Message from Isaiah Serge, NP sent at 01/27/2017  6:15 AM EDT ----- Take extra lasix Saturday as well then 40 mg one daily.  Recheck labs on Tuesday.

## 2017-01-30 ENCOUNTER — Other Ambulatory Visit: Payer: Medicare Other | Admitting: *Deleted

## 2017-01-30 DIAGNOSIS — Z79899 Other long term (current) drug therapy: Secondary | ICD-10-CM | POA: Diagnosis not present

## 2017-01-31 LAB — BASIC METABOLIC PANEL
BUN/Creatinine Ratio: 19 (ref 10–24)
BUN: 49 mg/dL — ABNORMAL HIGH (ref 8–27)
CALCIUM: 9.1 mg/dL (ref 8.6–10.2)
CO2: 19 mmol/L (ref 18–29)
CREATININE: 2.63 mg/dL — AB (ref 0.76–1.27)
Chloride: 105 mmol/L (ref 96–106)
GFR calc Af Amer: 28 mL/min/{1.73_m2} — ABNORMAL LOW (ref >59)
GFR, EST NON AFRICAN AMERICAN: 24 mL/min/{1.73_m2} — AB (ref >59)
GLUCOSE: 185 mg/dL — AB (ref 65–99)
Potassium: 4.5 mmol/L (ref 3.5–5.2)
Sodium: 144 mmol/L (ref 134–144)

## 2017-02-02 ENCOUNTER — Telehealth: Payer: Self-pay | Admitting: Cardiology

## 2017-02-02 DIAGNOSIS — M109 Gout, unspecified: Secondary | ICD-10-CM | POA: Diagnosis not present

## 2017-02-02 DIAGNOSIS — L8993 Pressure ulcer of unspecified site, stage 3: Secondary | ICD-10-CM | POA: Diagnosis not present

## 2017-02-02 DIAGNOSIS — H409 Unspecified glaucoma: Secondary | ICD-10-CM | POA: Diagnosis not present

## 2017-02-02 DIAGNOSIS — J449 Chronic obstructive pulmonary disease, unspecified: Secondary | ICD-10-CM | POA: Diagnosis not present

## 2017-02-02 DIAGNOSIS — M1990 Unspecified osteoarthritis, unspecified site: Secondary | ICD-10-CM | POA: Diagnosis not present

## 2017-02-02 DIAGNOSIS — L84 Corns and callosities: Secondary | ICD-10-CM | POA: Diagnosis not present

## 2017-02-02 DIAGNOSIS — Z79899 Other long term (current) drug therapy: Secondary | ICD-10-CM

## 2017-02-02 DIAGNOSIS — E1140 Type 2 diabetes mellitus with diabetic neuropathy, unspecified: Secondary | ICD-10-CM | POA: Diagnosis not present

## 2017-02-02 DIAGNOSIS — L8992 Pressure ulcer of unspecified site, stage 2: Secondary | ICD-10-CM | POA: Diagnosis not present

## 2017-02-02 DIAGNOSIS — I2510 Atherosclerotic heart disease of native coronary artery without angina pectoris: Secondary | ICD-10-CM | POA: Diagnosis not present

## 2017-02-02 DIAGNOSIS — I509 Heart failure, unspecified: Secondary | ICD-10-CM | POA: Diagnosis not present

## 2017-02-02 DIAGNOSIS — I252 Old myocardial infarction: Secondary | ICD-10-CM | POA: Diagnosis not present

## 2017-02-02 DIAGNOSIS — H269 Unspecified cataract: Secondary | ICD-10-CM | POA: Diagnosis not present

## 2017-02-02 DIAGNOSIS — D649 Anemia, unspecified: Secondary | ICD-10-CM | POA: Diagnosis not present

## 2017-02-02 DIAGNOSIS — L97522 Non-pressure chronic ulcer of other part of left foot with fat layer exposed: Secondary | ICD-10-CM | POA: Diagnosis not present

## 2017-02-02 DIAGNOSIS — E11621 Type 2 diabetes mellitus with foot ulcer: Secondary | ICD-10-CM | POA: Diagnosis not present

## 2017-02-02 DIAGNOSIS — I110 Hypertensive heart disease with heart failure: Secondary | ICD-10-CM | POA: Diagnosis not present

## 2017-02-02 NOTE — Telephone Encounter (Signed)
-----   Message from Isaiah Serge, NP sent at 02/01/2017  3:53 PM EDT ----- Cr is stable coming down some.  Stay on lasix 40 and how is his edema?  Keep follow up with Dr. [name].  Recheck BMP in 1 weeks to make sure it is still coming down.

## 2017-02-02 NOTE — Telephone Encounter (Signed)
New Message ° ° pt verbalized that he is returning call for rn  °

## 2017-02-06 DIAGNOSIS — L97522 Non-pressure chronic ulcer of other part of left foot with fat layer exposed: Secondary | ICD-10-CM | POA: Diagnosis not present

## 2017-02-06 DIAGNOSIS — M1990 Unspecified osteoarthritis, unspecified site: Secondary | ICD-10-CM | POA: Diagnosis not present

## 2017-02-06 DIAGNOSIS — I2510 Atherosclerotic heart disease of native coronary artery without angina pectoris: Secondary | ICD-10-CM | POA: Diagnosis not present

## 2017-02-06 DIAGNOSIS — E11621 Type 2 diabetes mellitus with foot ulcer: Secondary | ICD-10-CM | POA: Diagnosis not present

## 2017-02-06 DIAGNOSIS — I252 Old myocardial infarction: Secondary | ICD-10-CM | POA: Diagnosis not present

## 2017-02-06 DIAGNOSIS — H409 Unspecified glaucoma: Secondary | ICD-10-CM | POA: Diagnosis not present

## 2017-02-06 DIAGNOSIS — L8993 Pressure ulcer of unspecified site, stage 3: Secondary | ICD-10-CM | POA: Diagnosis not present

## 2017-02-06 DIAGNOSIS — E1140 Type 2 diabetes mellitus with diabetic neuropathy, unspecified: Secondary | ICD-10-CM | POA: Diagnosis not present

## 2017-02-06 DIAGNOSIS — I509 Heart failure, unspecified: Secondary | ICD-10-CM | POA: Diagnosis not present

## 2017-02-06 DIAGNOSIS — M109 Gout, unspecified: Secondary | ICD-10-CM | POA: Diagnosis not present

## 2017-02-06 DIAGNOSIS — D649 Anemia, unspecified: Secondary | ICD-10-CM | POA: Diagnosis not present

## 2017-02-06 DIAGNOSIS — J449 Chronic obstructive pulmonary disease, unspecified: Secondary | ICD-10-CM | POA: Diagnosis not present

## 2017-02-06 DIAGNOSIS — L8992 Pressure ulcer of unspecified site, stage 2: Secondary | ICD-10-CM | POA: Diagnosis not present

## 2017-02-06 DIAGNOSIS — L84 Corns and callosities: Secondary | ICD-10-CM | POA: Diagnosis not present

## 2017-02-06 DIAGNOSIS — H269 Unspecified cataract: Secondary | ICD-10-CM | POA: Diagnosis not present

## 2017-02-06 DIAGNOSIS — I110 Hypertensive heart disease with heart failure: Secondary | ICD-10-CM | POA: Diagnosis not present

## 2017-02-07 ENCOUNTER — Encounter: Payer: Self-pay | Admitting: Cardiovascular Disease

## 2017-02-08 ENCOUNTER — Other Ambulatory Visit: Payer: Medicare Other

## 2017-02-08 DIAGNOSIS — Z79899 Other long term (current) drug therapy: Secondary | ICD-10-CM | POA: Diagnosis not present

## 2017-02-08 LAB — BASIC METABOLIC PANEL
BUN / CREAT RATIO: 17 (ref 10–24)
BUN: 55 mg/dL — ABNORMAL HIGH (ref 8–27)
CHLORIDE: 102 mmol/L (ref 96–106)
CO2: 21 mmol/L (ref 18–29)
Calcium: 9.1 mg/dL (ref 8.6–10.2)
Creatinine, Ser: 3.31 mg/dL — ABNORMAL HIGH (ref 0.76–1.27)
GFR calc Af Amer: 21 mL/min/{1.73_m2} — ABNORMAL LOW (ref >59)
GFR calc non Af Amer: 18 mL/min/{1.73_m2} — ABNORMAL LOW (ref >59)
GLUCOSE: 158 mg/dL — AB (ref 65–99)
POTASSIUM: 4.8 mmol/L (ref 3.5–5.2)
SODIUM: 141 mmol/L (ref 134–144)

## 2017-02-09 ENCOUNTER — Telehealth: Payer: Self-pay

## 2017-02-09 DIAGNOSIS — I2510 Atherosclerotic heart disease of native coronary artery without angina pectoris: Secondary | ICD-10-CM | POA: Diagnosis not present

## 2017-02-09 DIAGNOSIS — L97522 Non-pressure chronic ulcer of other part of left foot with fat layer exposed: Secondary | ICD-10-CM | POA: Diagnosis not present

## 2017-02-09 DIAGNOSIS — I509 Heart failure, unspecified: Secondary | ICD-10-CM | POA: Diagnosis not present

## 2017-02-09 DIAGNOSIS — H269 Unspecified cataract: Secondary | ICD-10-CM | POA: Diagnosis not present

## 2017-02-09 DIAGNOSIS — R799 Abnormal finding of blood chemistry, unspecified: Secondary | ICD-10-CM

## 2017-02-09 DIAGNOSIS — D649 Anemia, unspecified: Secondary | ICD-10-CM | POA: Diagnosis not present

## 2017-02-09 DIAGNOSIS — I110 Hypertensive heart disease with heart failure: Secondary | ICD-10-CM | POA: Diagnosis not present

## 2017-02-09 DIAGNOSIS — R7989 Other specified abnormal findings of blood chemistry: Secondary | ICD-10-CM

## 2017-02-09 DIAGNOSIS — Z79899 Other long term (current) drug therapy: Secondary | ICD-10-CM

## 2017-02-09 DIAGNOSIS — L84 Corns and callosities: Secondary | ICD-10-CM | POA: Diagnosis not present

## 2017-02-09 DIAGNOSIS — M109 Gout, unspecified: Secondary | ICD-10-CM | POA: Diagnosis not present

## 2017-02-09 DIAGNOSIS — L8992 Pressure ulcer of unspecified site, stage 2: Secondary | ICD-10-CM | POA: Diagnosis not present

## 2017-02-09 DIAGNOSIS — J449 Chronic obstructive pulmonary disease, unspecified: Secondary | ICD-10-CM | POA: Diagnosis not present

## 2017-02-09 DIAGNOSIS — I252 Old myocardial infarction: Secondary | ICD-10-CM | POA: Diagnosis not present

## 2017-02-09 DIAGNOSIS — H409 Unspecified glaucoma: Secondary | ICD-10-CM | POA: Diagnosis not present

## 2017-02-09 DIAGNOSIS — L8993 Pressure ulcer of unspecified site, stage 3: Secondary | ICD-10-CM | POA: Diagnosis not present

## 2017-02-09 DIAGNOSIS — E11621 Type 2 diabetes mellitus with foot ulcer: Secondary | ICD-10-CM | POA: Diagnosis not present

## 2017-02-09 DIAGNOSIS — M1990 Unspecified osteoarthritis, unspecified site: Secondary | ICD-10-CM | POA: Diagnosis not present

## 2017-02-09 DIAGNOSIS — E1140 Type 2 diabetes mellitus with diabetic neuropathy, unspecified: Secondary | ICD-10-CM | POA: Diagnosis not present

## 2017-02-09 NOTE — Telephone Encounter (Signed)
-----   Message from [name], MD sent at 02/09/2017  9:41 AM EDT ----- K is fine and he has known stage 4 CRF let him f/u with with his nephrologist and continue current lasix dose had CHF with elevated BNP over 1000   ----- Message ----- From: Jeanann Lewandowsky, RMA Sent: 02/09/2017   8:20 AM To: [name], MD, Michaelyn Barter, RN  I called pt and advised him to hold his Lasix and have his lab rechecked Monday, 02/13/17 and pt is real aggravated about this and said that this is ridiculous to have him come back and forth every week.  He wanted to wait until he went to his Nephrologist 03/07/17, but I advised that we really needed to recheck his kidney function before then.  Pt verbalized that he wanted to talk to Dr. [name] re: all this. I advised pt I would send Dr. [name] and his nurse, Pam a message. Pt verbalized understanding.

## 2017-02-09 NOTE — Telephone Encounter (Signed)
Pt aware of Dr. Kyla Balzarine recommendations and verbalized understanding. See lab results.

## 2017-02-09 NOTE — Telephone Encounter (Signed)
Patient returning a call,thanks.

## 2017-02-09 NOTE — Telephone Encounter (Signed)
-----   Message from Jeanann Lewandowsky, [place] sent at 02/09/2017  8:20 AM EDT ----- I called pt and advised him to hold his Lasix and have his lab rechecked Monday, 02/13/17 and pt is real aggravated about this and said that this is ridiculous to have him come back and forth every week.  He wanted to wait until he went to his Nephrologist 03/07/17, but I advised that we really needed to recheck his kidney function before then.  Pt verbalized that he wanted to talk to Dr. [name] re: all this. I advised pt I would send Dr. [name] and his nurse, Pam a message. Pt verbalized understanding.

## 2017-02-09 NOTE — Telephone Encounter (Signed)
Patient called back and scheduled lab appointment for Monday.

## 2017-02-13 ENCOUNTER — Other Ambulatory Visit: Payer: Medicare Other

## 2017-02-16 ENCOUNTER — Other Ambulatory Visit: Payer: Self-pay | Admitting: Internal Medicine

## 2017-02-16 ENCOUNTER — Other Ambulatory Visit: Payer: Self-pay

## 2017-02-16 ENCOUNTER — Ambulatory Visit (HOSPITAL_COMMUNITY)
Admission: RE | Admit: 2017-02-16 | Discharge: 2017-02-16 | Disposition: A | Discharge status: Home / Self Care | Payer: Medicare Other | Source: Ambulatory Visit | Attending: Internal Medicine | Admitting: Internal Medicine

## 2017-02-16 DIAGNOSIS — M109 Gout, unspecified: Secondary | ICD-10-CM | POA: Diagnosis not present

## 2017-02-16 DIAGNOSIS — L8993 Pressure ulcer of unspecified site, stage 3: Secondary | ICD-10-CM | POA: Diagnosis not present

## 2017-02-16 DIAGNOSIS — M2011 Hallux valgus (acquired), right foot: Secondary | ICD-10-CM | POA: Diagnosis not present

## 2017-02-16 DIAGNOSIS — J449 Chronic obstructive pulmonary disease, unspecified: Secondary | ICD-10-CM | POA: Diagnosis not present

## 2017-02-16 DIAGNOSIS — M21612 Bunion of left foot: Secondary | ICD-10-CM | POA: Insufficient documentation

## 2017-02-16 DIAGNOSIS — H269 Unspecified cataract: Secondary | ICD-10-CM | POA: Diagnosis not present

## 2017-02-16 DIAGNOSIS — I2510 Atherosclerotic heart disease of native coronary artery without angina pectoris: Secondary | ICD-10-CM | POA: Diagnosis not present

## 2017-02-16 DIAGNOSIS — L84 Corns and callosities: Secondary | ICD-10-CM | POA: Diagnosis not present

## 2017-02-16 DIAGNOSIS — L97529 Non-pressure chronic ulcer of other part of left foot with unspecified severity: Secondary | ICD-10-CM | POA: Diagnosis not present

## 2017-02-16 DIAGNOSIS — I110 Hypertensive heart disease with heart failure: Secondary | ICD-10-CM | POA: Diagnosis not present

## 2017-02-16 DIAGNOSIS — I252 Old myocardial infarction: Secondary | ICD-10-CM | POA: Diagnosis not present

## 2017-02-16 DIAGNOSIS — M869 Osteomyelitis, unspecified: Secondary | ICD-10-CM

## 2017-02-16 DIAGNOSIS — L8992 Pressure ulcer of unspecified site, stage 2: Secondary | ICD-10-CM | POA: Diagnosis not present

## 2017-02-16 DIAGNOSIS — H409 Unspecified glaucoma: Secondary | ICD-10-CM | POA: Diagnosis not present

## 2017-02-16 DIAGNOSIS — S91302A Unspecified open wound, left foot, initial encounter: Secondary | ICD-10-CM | POA: Diagnosis not present

## 2017-02-16 DIAGNOSIS — I509 Heart failure, unspecified: Secondary | ICD-10-CM | POA: Diagnosis not present

## 2017-02-16 DIAGNOSIS — E11621 Type 2 diabetes mellitus with foot ulcer: Secondary | ICD-10-CM | POA: Diagnosis not present

## 2017-02-16 DIAGNOSIS — L97522 Non-pressure chronic ulcer of other part of left foot with fat layer exposed: Secondary | ICD-10-CM | POA: Diagnosis not present

## 2017-02-16 DIAGNOSIS — M7732 Calcaneal spur, left foot: Secondary | ICD-10-CM | POA: Insufficient documentation

## 2017-02-16 DIAGNOSIS — E1140 Type 2 diabetes mellitus with diabetic neuropathy, unspecified: Secondary | ICD-10-CM | POA: Diagnosis not present

## 2017-02-16 DIAGNOSIS — D649 Anemia, unspecified: Secondary | ICD-10-CM | POA: Diagnosis not present

## 2017-02-16 DIAGNOSIS — M1990 Unspecified osteoarthritis, unspecified site: Secondary | ICD-10-CM | POA: Diagnosis not present

## 2017-02-16 MED ORDER — GABAPENTIN 300 MG PO CAPS
300.0000 mg | ORAL_CAPSULE | Freq: Three times a day (TID) | ORAL | 1 refills | Status: DC
Start: 2017-02-16 — End: 2017-04-16

## 2017-02-16 NOTE — Telephone Encounter (Signed)
Patient called to request a refill for Gabapentin to be sent ot [facility] now [facility]

## 2017-02-21 ENCOUNTER — Other Ambulatory Visit: Payer: Self-pay | Admitting: Internal Medicine

## 2017-02-21 ENCOUNTER — Other Ambulatory Visit: Payer: Self-pay

## 2017-02-21 DIAGNOSIS — E781 Pure hyperglyceridemia: Secondary | ICD-10-CM

## 2017-02-21 NOTE — Patient Outreach (Signed)
    Telephone call received from patient requesting information regarding his transportation to Opthalmologist in the morning. RNCM advised patient the CM Assistant will notify him in the morning after appointment has been scheduled. RNCM will make telephone call to transportation agency when she arrives in the morning. Patient advised [employer] would be charged an extra holding fee if appointments are made before day of the service.  Patient verbalized his distaste for the process of arranging transportation. This RNCM advised patient he could arrange transportation on his own.  Call made to [place], [place] to verify intent to schedule appointment in the morning. CM Assistant advises receiving a call from patient.

## 2017-02-22 DIAGNOSIS — H401133 Primary open-angle glaucoma, bilateral, severe stage: Secondary | ICD-10-CM | POA: Diagnosis not present

## 2017-02-22 NOTE — Progress Notes (Deleted)
[name], male   DOB: 1951-05-04, 66 y.o.   MRN: [id number]    Cardiology Office Note   Date:  02/22/2017   ID:  JOCOB DAMBACH, DOB 1951-10-09, MRN [id number]  PCP:  Gildardo Cranker, DO  Cardiologist:  Dr. Jenkins Rouge   Electrophysiologist:  Dr. Virl Axe   No chief complaint on file.    History of Present Illness: Jacob [name] is a 66 y.o. male with a hx of CAD, s/p CABG 2004, ischemic cardiomyopathy, HTN, HL, HIV, hepatitis C, CKD. Previously followed by Dr. Verl Blalock. Established with me in  10/2013. Cardiac MRI demonstrated EF < 35%. He was referred to Dr. Virl Axe for possible AICD. The patient ultimately declined implantation of ICD. His father died at [name] after implant  With pneumonia   Seen in ED 01/03/15 with chest pain.  Symptoms were felt to be stable and outpatient workup recommended.  F/U myovue non ischemic  Large inferior and antero apical /lateral scars EF 24%  01/20/15   Studies: - LHC (04/2005): D1 occluded, proximal circumflex 70%, ostial RCA occluded, ramus branch of the circumflex occluded; SVG-ramus patent, SVG-RCA patent - Echo (08/2013): Mild LVH, EF 35-40%, inferior hypokinesis, grade 1 diastolic dysfunction, mild BAE. - Nuclear (01/13/14): Inferior and anterolateral infarct, no ischemia, EF 30%; high-risk   Cardiac MRI 11/2013 1) Moderate LVE with LVH EF 31% with multiple RWMAls as described above Consistent with multi vessel CAD 2) Full thickness scar involving the lateral and inferolateral walls 3) Mild LAE 4) Normal RA/RV  02/2016 Had back surgery with Dr Lorin Mercy for L4-5 decompression and dural tear. No complications   Has CRF and lasix needs to be adjusted at times for this Last Cr 3.3 02/08/17   Does not want AICD still  Son Jacob [name] is aggravating him a lot. Divorced with a child and living with ex wife Not smart with his money and always asking for help  Past Medical History:  Diagnosis Date  . Arthritis   . Cataracts,  bilateral    immature  . CHF (congestive heart failure) (HCC)    not on any meds  . Chronic back pain    stenosis  . Chronic kidney disease, unspecified   . Coronary atherosclerosis of artery bypass graft   . Coronary atherosclerosis of native coronary artery 2005   s/p surgery  . Drug abuse    hx; tested for cocaine as recently as 2/08. says he is not using drugs now - avoided defib. for this reason   . GERD (gastroesophageal reflux disease)    takes OTC meds as needed  . Glaucoma    uses eye drops daily  . Gout, unspecified    takes Allopurinol daily as well as Colchicine-if needed  . Hepatitis 1967   Hep C  . History of colon polyps    benign  . HTN (hypertension)    takes Coreg,Imdur.and Apresoline daily  . Human immunodeficiency virus (HIV) disease ([facility])    takes Genvoya daily  . Hyperlipidemia    takes Atorvastatin daily  . Ischemic cardiomyopathy   . Muscle spasm    takes Zanaflex as needed  . Nocturia   . Peripheral neuropathy    takes gabapentin daily  . Pneumonia    hx of  . Shortness of breath dyspnea    rarely but if notices it then with exertion  . Syphilis, unspecified   . Type II or unspecified type diabetes mellitus without mention of complication, not  stated as uncontrolled 2004   Lantus daily.Average fasting blood sugar 125-199    Past Surgical History:  Procedure Laterality Date  . COLONOSCOPY  2013   Dr.John Henrene Pastor   . CORONARY ARTERY BYPASS GRAFT    . laproscopic cholecystectomy  8/07  . left intertrochanteric hip fracture  2004   s/p intermedullary nail placement 2/08  . left transverse mandibular fracture  10/05  . LUMBAR LAMINECTOMY/DECOMPRESSION MICRODISCECTOMY N/A 02/29/2016   Procedure: Left L4-5 Lateral Recess Decompression, Removal Extradural Intraspinal Facet Cyst;  Surgeon: Marybelle Killings, MD;  Location: [facility];  Service: Orthopedics;  Laterality: N/A;     Current Outpatient Prescriptions  Medication Sig Dispense Refill  .  allopurinol (ZYLOPRIM) 100 MG tablet take 1 tablet by mouth once daily 30 tablet 3  . ANORO ELLIPTA 62.5-25 MCG/INH AEPB INHALE 1 PUFF INTO THE LUNGS DAILY 60 each 5  . aspirin EC 81 MG tablet Take 81 mg by mouth daily.    [name] atorvastatin (LIPITOR) 20 MG tablet take 1 tablet by mouth once daily 30 tablet 11  . B-D UF III MINI PEN NEEDLES 31G X 5 MM MISC USE 4 TIMES DAILY 150 each 11  . carvedilol (COREG) 25 MG tablet take 1 tablet by mouth twice a day with meals 180 tablet 1  . cephALEXin (KEFLEX) 500 MG capsule Take 1 capsule (500 mg total) by mouth 3 (three) times daily. 30 capsule 2  . Choline Fenofibrate (FENOFIBRIC ACID) 135 MG CPDR take 1 capsule by mouth once daily 30 capsule 6  . colchicine 0.6 MG tablet take 1 tablet by mouth CONTINUOUS AS NEEDED FOR GOUT 30 tablet 2  . elvitegravir-cobicistat-emtricitabine-tenofovir (GENVOYA) 150-150-200-10 MG TABS tablet TAKE 1 TABLET BY MOUTH DAILY WITH BREAKFAST. 30 tablet 11  . furosemide (LASIX) 40 MG tablet Take 1 tablet (40 mg total) by mouth daily. 90 tablet 3  . gabapentin (NEURONTIN) 300 MG capsule Take 1 capsule (300 mg total) by mouth 3 (three) times daily. 90 capsule 1  . Glucosamine-Chondroit-Vit C-Mn (GLUCOSAMINE CHONDR 1500 COMPLX PO) Take 2 tablets by mouth daily. Reported on 04/04/2016    . HUMALOG KWIKPEN 100 UNIT/ML KiwkPen inject 12 units subcutaneously three times a day before meals 15 mL 5  . hydrALAZINE (APRESOLINE) 25 MG tablet take 1 tablet by mouth three times a day 90 tablet 8  . isosorbide mononitrate (IMDUR) 30 MG 24 hr tablet Take 30 mg by mouth daily. Reported on 04/04/2016    . LANTUS SOLOSTAR 100 UNIT/ML Solostar Pen Inject 70 Units into the skin every morning. 35 units on each side  0  . latanoprost (XALATAN) 0.005 % ophthalmic solution Place 1 drop into both eyes at bedtime. Reported on 04/04/2016    . Naftifine HCl 2 % CREA apply to affected area twice a day 60 g 11  . nitroGLYCERIN (NITROSTAT) 0.3 MG SL tablet Place 1  tablet (0.3 mg total) under the tongue as needed for chest pain (not to exceed 3 tablets in one day.). 25 tablet 2  . ONE TOUCH LANCETS MISC Check BS twice daily. 200 each 3  . ONE TOUCH ULTRA TEST test strip CHECK BLOOD SUGAR TWO TIMES DAILY AS DIRECTED 200 each 6   No current facility-administered medications for this visit.     Allergies:   Amphetamines    Social History:  The patient  reports that he has been smoking Cigarettes.  He has a 20.50 pack-year smoking history. He has never used smokeless tobacco. He reports  that he drinks about 3.6 oz of alcohol per week . He reports that he does not use drugs.   Family History:  The patient's family history includes Alzheimer's disease in his mother and sister; Diabetes in his brother, brother, and sister; Drug abuse in his brother; Heart attack in his brother; Heart failure in his father; Hypertension in his brother and father; Stroke in his sister.    ROS:   Please see the history of present illness.   Review of Systems  Constitution: Positive for diaphoresis.  Cardiovascular: Positive for chest pain and dyspnea on exertion.  Respiratory: Positive for cough.   Musculoskeletal: Positive for myalgias.  All other systems reviewed and are negative.    PHYSICAL EXAM: VS:  There were no vitals taken for this visit.    Wt Readings from Last 3 Encounters:  01/26/17 192 lb 1.9 oz (87.1 kg)  01/17/17 195 lb 12.8 oz (88.8 kg)  12/26/16 183 lb (83 kg)     GEN: Well nourished, well developed, in no acute distress  HEENT: normal  Neck: no JVD, no masses Cardiac:  Normal S1/S2, RRR; no murmur ,  no rubs or gallops, no edema  Respiratory:  clear to auscultation bilaterally, no wheezing, rhonchi or rales. GI: soft, nontender, nondistended, + BS MS: no deformity or atrophy  Skin: warm and dry  Neuro:  CNs II-XII intact, Strength and sensation are intact Psych: Normal affect   EKG:  March 2016    NSR, HR 81, inf Q waves, TWI in 1, aVL,  V4-6, no changes   Recent Labs: 11/10/2016: B Natriuretic Peptide 740.5; TSH 0.540 11/11/2016: ALT 24 11/18/2016: Hemoglobin 10.2; Platelets 304 01/17/2017: NT-Pro BNP 1,640 02/08/2017: BUN 55; Creatinine, Ser 3.31; Potassium 4.8; Sodium 141    Lipid Panel    Component Value Date/Time   CHOL 162 08/18/2016 0850   CHOL 121 08/07/2015 1041   TRIG 165 (H) 08/18/2016 0850   HDL 39 (L) 08/18/2016 0850   HDL 32 (L) 08/07/2015 1041   CHOLHDL 4.2 08/18/2016 0850   VLDL 33 (H) 08/18/2016 0850   LDLCALC 90 08/18/2016 0850   LDLCALC 15 08/07/2015 1041      ASSESSMENT AND PLAN:  Other chest pain Atypical f/u myovue non ischemic continue medical Rx   Coronary artery disease involving native coronary artery of native heart without angina pectoris  Continue aspirin, statin, beta blocker, ACE inhibitor.  Ischemic cardiomyopathy He has refused ICD implantation in  past. Continue beta blocker, ACE inhibitor, hydralazine. We had a long discussion about maximizing his CHF medications. He does not take any PDE-5 inhibitors.Tolerating nitrates   Chronic combined systolic and diastolic congestive heart failure Volume stable. He is NYHA 2.  Essential hypertension Controlled.  HLD (hyperlipidemia)  Continue statin.  Ortho:  Post back surgery May 2017 f/u Dr Lorin Mercy much relief   CRF:  Significant renal failure causes fluid retention worse on higher dose lasix   Current medicines are reviewed at length with the patient today.  The patient does not have concerns regarding medicines.  The following changes have been made:  As above   Labs/ tests ordered today include:   No orders of the defined types were placed in this encounter.   Disposition:   FU with me in 76months    [employer]

## 2017-02-23 ENCOUNTER — Encounter (HOSPITAL_BASED_OUTPATIENT_CLINIC_OR_DEPARTMENT_OTHER): Payer: Medicare Other | Attending: Internal Medicine

## 2017-02-23 DIAGNOSIS — I2510 Atherosclerotic heart disease of native coronary artery without angina pectoris: Secondary | ICD-10-CM | POA: Insufficient documentation

## 2017-02-23 DIAGNOSIS — L89893 Pressure ulcer of other site, stage 3: Secondary | ICD-10-CM | POA: Insufficient documentation

## 2017-02-23 DIAGNOSIS — J449 Chronic obstructive pulmonary disease, unspecified: Secondary | ICD-10-CM | POA: Insufficient documentation

## 2017-02-23 DIAGNOSIS — E1140 Type 2 diabetes mellitus with diabetic neuropathy, unspecified: Secondary | ICD-10-CM | POA: Insufficient documentation

## 2017-02-23 DIAGNOSIS — L84 Corns and callosities: Secondary | ICD-10-CM | POA: Insufficient documentation

## 2017-02-23 DIAGNOSIS — I252 Old myocardial infarction: Secondary | ICD-10-CM | POA: Insufficient documentation

## 2017-02-23 DIAGNOSIS — B1920 Unspecified viral hepatitis C without hepatic coma: Secondary | ICD-10-CM | POA: Insufficient documentation

## 2017-02-23 DIAGNOSIS — M109 Gout, unspecified: Secondary | ICD-10-CM | POA: Insufficient documentation

## 2017-02-23 DIAGNOSIS — H409 Unspecified glaucoma: Secondary | ICD-10-CM | POA: Insufficient documentation

## 2017-02-23 DIAGNOSIS — E11621 Type 2 diabetes mellitus with foot ulcer: Secondary | ICD-10-CM | POA: Insufficient documentation

## 2017-02-23 DIAGNOSIS — I509 Heart failure, unspecified: Secondary | ICD-10-CM | POA: Insufficient documentation

## 2017-02-23 DIAGNOSIS — H269 Unspecified cataract: Secondary | ICD-10-CM | POA: Insufficient documentation

## 2017-02-23 DIAGNOSIS — I110 Hypertensive heart disease with heart failure: Secondary | ICD-10-CM | POA: Insufficient documentation

## 2017-02-23 DIAGNOSIS — F17210 Nicotine dependence, cigarettes, uncomplicated: Secondary | ICD-10-CM | POA: Insufficient documentation

## 2017-02-23 DIAGNOSIS — D649 Anemia, unspecified: Secondary | ICD-10-CM | POA: Insufficient documentation

## 2017-02-23 DIAGNOSIS — M1990 Unspecified osteoarthritis, unspecified site: Secondary | ICD-10-CM | POA: Insufficient documentation

## 2017-02-24 ENCOUNTER — Ambulatory Visit: Payer: Medicare Other | Admitting: Cardiovascular Disease

## 2017-03-02 DIAGNOSIS — B1920 Unspecified viral hepatitis C without hepatic coma: Secondary | ICD-10-CM | POA: Diagnosis not present

## 2017-03-02 DIAGNOSIS — M109 Gout, unspecified: Secondary | ICD-10-CM | POA: Diagnosis not present

## 2017-03-02 DIAGNOSIS — I509 Heart failure, unspecified: Secondary | ICD-10-CM | POA: Diagnosis not present

## 2017-03-02 DIAGNOSIS — M1990 Unspecified osteoarthritis, unspecified site: Secondary | ICD-10-CM | POA: Diagnosis not present

## 2017-03-02 DIAGNOSIS — F17210 Nicotine dependence, cigarettes, uncomplicated: Secondary | ICD-10-CM | POA: Diagnosis not present

## 2017-03-02 DIAGNOSIS — D649 Anemia, unspecified: Secondary | ICD-10-CM | POA: Diagnosis not present

## 2017-03-02 DIAGNOSIS — J449 Chronic obstructive pulmonary disease, unspecified: Secondary | ICD-10-CM | POA: Diagnosis not present

## 2017-03-02 DIAGNOSIS — L89893 Pressure ulcer of other site, stage 3: Secondary | ICD-10-CM | POA: Diagnosis not present

## 2017-03-02 DIAGNOSIS — E11621 Type 2 diabetes mellitus with foot ulcer: Secondary | ICD-10-CM | POA: Diagnosis not present

## 2017-03-02 DIAGNOSIS — L84 Corns and callosities: Secondary | ICD-10-CM | POA: Diagnosis not present

## 2017-03-02 DIAGNOSIS — E1140 Type 2 diabetes mellitus with diabetic neuropathy, unspecified: Secondary | ICD-10-CM | POA: Diagnosis not present

## 2017-03-02 DIAGNOSIS — H409 Unspecified glaucoma: Secondary | ICD-10-CM | POA: Diagnosis not present

## 2017-03-02 DIAGNOSIS — I110 Hypertensive heart disease with heart failure: Secondary | ICD-10-CM | POA: Diagnosis not present

## 2017-03-02 DIAGNOSIS — H269 Unspecified cataract: Secondary | ICD-10-CM | POA: Diagnosis not present

## 2017-03-02 DIAGNOSIS — I2510 Atherosclerotic heart disease of native coronary artery without angina pectoris: Secondary | ICD-10-CM | POA: Diagnosis not present

## 2017-03-02 DIAGNOSIS — L97522 Non-pressure chronic ulcer of other part of left foot with fat layer exposed: Secondary | ICD-10-CM | POA: Diagnosis not present

## 2017-03-02 DIAGNOSIS — I252 Old myocardial infarction: Secondary | ICD-10-CM | POA: Diagnosis not present

## 2017-03-07 ENCOUNTER — Other Ambulatory Visit: Payer: Self-pay | Admitting: Internal Medicine

## 2017-03-09 DIAGNOSIS — I509 Heart failure, unspecified: Secondary | ICD-10-CM | POA: Diagnosis not present

## 2017-03-09 DIAGNOSIS — L84 Corns and callosities: Secondary | ICD-10-CM | POA: Diagnosis not present

## 2017-03-09 DIAGNOSIS — L89893 Pressure ulcer of other site, stage 3: Secondary | ICD-10-CM | POA: Diagnosis not present

## 2017-03-09 DIAGNOSIS — J449 Chronic obstructive pulmonary disease, unspecified: Secondary | ICD-10-CM | POA: Diagnosis not present

## 2017-03-09 DIAGNOSIS — H409 Unspecified glaucoma: Secondary | ICD-10-CM | POA: Diagnosis not present

## 2017-03-09 DIAGNOSIS — L97522 Non-pressure chronic ulcer of other part of left foot with fat layer exposed: Secondary | ICD-10-CM | POA: Diagnosis not present

## 2017-03-09 DIAGNOSIS — H269 Unspecified cataract: Secondary | ICD-10-CM | POA: Diagnosis not present

## 2017-03-09 DIAGNOSIS — M109 Gout, unspecified: Secondary | ICD-10-CM | POA: Diagnosis not present

## 2017-03-09 DIAGNOSIS — M1990 Unspecified osteoarthritis, unspecified site: Secondary | ICD-10-CM | POA: Diagnosis not present

## 2017-03-09 DIAGNOSIS — I2510 Atherosclerotic heart disease of native coronary artery without angina pectoris: Secondary | ICD-10-CM | POA: Diagnosis not present

## 2017-03-09 DIAGNOSIS — E11621 Type 2 diabetes mellitus with foot ulcer: Secondary | ICD-10-CM | POA: Diagnosis not present

## 2017-03-09 DIAGNOSIS — E1140 Type 2 diabetes mellitus with diabetic neuropathy, unspecified: Secondary | ICD-10-CM | POA: Diagnosis not present

## 2017-03-09 DIAGNOSIS — I110 Hypertensive heart disease with heart failure: Secondary | ICD-10-CM | POA: Diagnosis not present

## 2017-03-09 DIAGNOSIS — I252 Old myocardial infarction: Secondary | ICD-10-CM | POA: Diagnosis not present

## 2017-03-09 DIAGNOSIS — D649 Anemia, unspecified: Secondary | ICD-10-CM | POA: Diagnosis not present

## 2017-03-14 DIAGNOSIS — E11621 Type 2 diabetes mellitus with foot ulcer: Secondary | ICD-10-CM | POA: Diagnosis not present

## 2017-03-14 DIAGNOSIS — N2581 Secondary hyperparathyroidism of renal origin: Secondary | ICD-10-CM | POA: Diagnosis not present

## 2017-03-14 DIAGNOSIS — N183 Chronic kidney disease, stage 3 (moderate): Secondary | ICD-10-CM | POA: Diagnosis not present

## 2017-03-14 DIAGNOSIS — D631 Anemia in chronic kidney disease: Secondary | ICD-10-CM | POA: Diagnosis not present

## 2017-03-15 ENCOUNTER — Other Ambulatory Visit: Payer: Medicare Other

## 2017-03-15 ENCOUNTER — Telehealth: Payer: Self-pay

## 2017-03-15 DIAGNOSIS — E782 Mixed hyperlipidemia: Secondary | ICD-10-CM | POA: Diagnosis not present

## 2017-03-15 DIAGNOSIS — E1140 Type 2 diabetes mellitus with diabetic neuropathy, unspecified: Secondary | ICD-10-CM

## 2017-03-15 DIAGNOSIS — Z794 Long term (current) use of insulin: Principal | ICD-10-CM

## 2017-03-15 LAB — LIPID PANEL
Cholesterol: 178 mg/dL (ref <200)
HDL: 30 mg/dL — ABNORMAL LOW (ref >40)
LDL Cholesterol: 100 mg/dL — ABNORMAL HIGH (ref <100)
Total CHOL/HDL Ratio: 5.9 Ratio — ABNORMAL HIGH (ref <5.0)
Triglycerides: 239 mg/dL — ABNORMAL HIGH (ref <150)
VLDL: 48 mg/dL — ABNORMAL HIGH (ref <30)

## 2017-03-15 LAB — COMPLETE METABOLIC PANEL WITH GFR
ALBUMIN: 3.6 g/dL (ref 3.6–5.1)
ALK PHOS: 65 U/L (ref 40–115)
ALT: 20 U/L (ref 9–46)
AST: 25 U/L (ref 10–35)
BUN: 35 mg/dL — AB (ref 7–25)
CALCIUM: 8.8 mg/dL (ref 8.6–10.3)
CO2: 23 mmol/L (ref 20–31)
CREATININE: 2.37 mg/dL — AB (ref 0.70–1.25)
Chloride: 112 mmol/L — ABNORMAL HIGH (ref 98–110)
GFR, Est African American: 31 mL/min — ABNORMAL LOW (ref >=60)
GFR, Est Non African American: 27 mL/min — ABNORMAL LOW (ref >=60)
GLUCOSE: 53 mg/dL — AB (ref 65–99)
Potassium: 4.5 mmol/L (ref 3.5–5.3)
SODIUM: 141 mmol/L (ref 135–146)
TOTAL PROTEIN: 7.5 g/dL (ref 6.1–8.1)
Total Bilirubin: 0.3 mg/dL (ref 0.2–1.2)

## 2017-03-15 NOTE — Telephone Encounter (Signed)
Jacob [name] was in the office today for lab appt. He also dropped of an insert for the Nicotine patch and would like for Dr. [name] to review it for his upcoming appt on Friday. Placed in Dr. Saralyn Pilar Review and sign folder.

## 2017-03-16 DIAGNOSIS — L89893 Pressure ulcer of other site, stage 3: Secondary | ICD-10-CM | POA: Diagnosis not present

## 2017-03-16 DIAGNOSIS — E1140 Type 2 diabetes mellitus with diabetic neuropathy, unspecified: Secondary | ICD-10-CM | POA: Diagnosis not present

## 2017-03-16 DIAGNOSIS — I110 Hypertensive heart disease with heart failure: Secondary | ICD-10-CM | POA: Diagnosis not present

## 2017-03-16 DIAGNOSIS — I509 Heart failure, unspecified: Secondary | ICD-10-CM | POA: Diagnosis not present

## 2017-03-16 DIAGNOSIS — I252 Old myocardial infarction: Secondary | ICD-10-CM | POA: Diagnosis not present

## 2017-03-16 DIAGNOSIS — I2510 Atherosclerotic heart disease of native coronary artery without angina pectoris: Secondary | ICD-10-CM | POA: Diagnosis not present

## 2017-03-16 DIAGNOSIS — L97522 Non-pressure chronic ulcer of other part of left foot with fat layer exposed: Secondary | ICD-10-CM | POA: Diagnosis not present

## 2017-03-16 DIAGNOSIS — H269 Unspecified cataract: Secondary | ICD-10-CM | POA: Diagnosis not present

## 2017-03-16 DIAGNOSIS — E11621 Type 2 diabetes mellitus with foot ulcer: Secondary | ICD-10-CM | POA: Diagnosis not present

## 2017-03-16 DIAGNOSIS — D649 Anemia, unspecified: Secondary | ICD-10-CM | POA: Diagnosis not present

## 2017-03-16 DIAGNOSIS — M109 Gout, unspecified: Secondary | ICD-10-CM | POA: Diagnosis not present

## 2017-03-16 DIAGNOSIS — M1990 Unspecified osteoarthritis, unspecified site: Secondary | ICD-10-CM | POA: Diagnosis not present

## 2017-03-16 DIAGNOSIS — J449 Chronic obstructive pulmonary disease, unspecified: Secondary | ICD-10-CM | POA: Diagnosis not present

## 2017-03-16 DIAGNOSIS — H409 Unspecified glaucoma: Secondary | ICD-10-CM | POA: Diagnosis not present

## 2017-03-16 DIAGNOSIS — L84 Corns and callosities: Secondary | ICD-10-CM | POA: Diagnosis not present

## 2017-03-16 LAB — HEMOGLOBIN A1C
HEMOGLOBIN A1C: 6.4 % — AB (ref <5.7)
MEAN PLASMA GLUCOSE: 137 mg/dL

## 2017-03-17 ENCOUNTER — Encounter: Payer: Self-pay | Admitting: Internal Medicine

## 2017-03-17 ENCOUNTER — Ambulatory Visit (INDEPENDENT_AMBULATORY_CARE_PROVIDER_SITE_OTHER): Payer: Medicare Other | Admitting: Internal Medicine

## 2017-03-17 VITALS — BP 140/78 | HR 63 | Temp 98.4°F | Ht 74.0 in | Wt 196.4 lb

## 2017-03-17 DIAGNOSIS — E11621 Type 2 diabetes mellitus with foot ulcer: Secondary | ICD-10-CM | POA: Diagnosis not present

## 2017-03-17 DIAGNOSIS — I5042 Chronic combined systolic (congestive) and diastolic (congestive) heart failure: Secondary | ICD-10-CM

## 2017-03-17 DIAGNOSIS — Z720 Tobacco use: Secondary | ICD-10-CM

## 2017-03-17 DIAGNOSIS — N183 Chronic kidney disease, stage 3 unspecified: Secondary | ICD-10-CM

## 2017-03-17 DIAGNOSIS — I10 Essential (primary) hypertension: Secondary | ICD-10-CM | POA: Diagnosis not present

## 2017-03-17 DIAGNOSIS — L97528 Non-pressure chronic ulcer of other part of left foot with other specified severity: Secondary | ICD-10-CM | POA: Diagnosis not present

## 2017-03-17 DIAGNOSIS — E782 Mixed hyperlipidemia: Secondary | ICD-10-CM | POA: Diagnosis not present

## 2017-03-17 MED ORDER — LANTUS SOLOSTAR 100 UNIT/ML ~~LOC~~ SOPN: 70.0000 [IU] | PEN_INJECTOR | SUBCUTANEOUS | 0 refills | Status: DC

## 2017-03-17 NOTE — Patient Instructions (Addendum)
May use nicotine gum for smoking cessation  Continue other medications as ordered  Follow up with specialists as scheduled - including wound center  Follow up in 4 mos for AWV/CPE. Fasting labs prior to appt

## 2017-03-17 NOTE — Progress Notes (Signed)
Patient ID: TAGE FEGGINS, male   DOB: 1951-05-22, 66 y.o.   MRN: [id number]    Location:  [facility] Place of Service: OFFICE  Chief Complaint  Patient presents with  . Medical Management of Chronic Issues    4 Month Follow up    HPI:  66 yo male seen today for f/u. He was dx with left plantar ulcer and is being followed by wound care center Dr Dellia Nims and podiatry Dr Amalia Hailey. No f/c. He wears soft shoe. No purulent d/c.  tob abuse - still smoking but down to < 1/2 ppd. He would like to start using nicotine gum.   HIV - stable on HAART tx. HIV1 RNA quant <20; log <1.3. CD4 abs 1330. Followed by ID Dr [name] annually  HTN/CHF - BP stable on hydralazine, coreg, imdur. Off ARB since previous hospital d/c. Followed by Dr [name]   DM - BS improved with 1/2 dose lantus on each side of abdomen.  No low BS reactions. Numbness/tingling in feet. Takes Lantus  insulin 70 units qhs and humalog 12 units qAC . Followed by podiatry. No ulcerations but has calluses. A1c 6.4%. He is not on ARB due to AKI and hyperkalemia. He has an eye specialist. He has diabetic foot ulcer  Hyperlipidemia - TG 239; LDL 100. He is currently on lipitor 96m qhs. He likes fried fish and eats it once a week. He also likes cookies and starbursts but has cut back significantly. He has not had french fries since last OV. Making healthy food choices.  CKD  - borderline stage 3. Cr 2.37. Followed by nephrology Dr UHollie Salk Past Medical History:  Diagnosis Date  . Arthritis   . Cataracts, bilateral    immature  . CHF (congestive heart failure) (HCC)    not on any meds  . Chronic back pain    stenosis  . Chronic kidney disease, unspecified   . Coronary atherosclerosis of artery bypass graft   . Coronary atherosclerosis of native coronary artery 2005   s/p surgery  . Drug abuse    hx; tested for cocaine as recently as 2/08. says he is not using drugs now - avoided defib. for this reason   . GERD (gastroesophageal reflux disease)     takes OTC meds as needed  . Glaucoma    uses eye drops daily  . Gout, unspecified    takes Allopurinol daily as well as Colchicine-if needed  . Hepatitis 1967   Hep C  . History of colon polyps    benign  . HTN (hypertension)    takes Coreg,Imdur.and Apresoline daily  . Human immunodeficiency virus (HIV) disease ([place]    takes Genvoya daily  . Hyperlipidemia    takes Atorvastatin daily  . Ischemic cardiomyopathy   . Muscle spasm    takes Zanaflex as needed  . Nocturia   . Peripheral neuropathy    takes gabapentin daily  . Pneumonia    hx of  . Shortness of breath dyspnea    rarely but if notices it then with exertion  . Syphilis, unspecified   . Type II or unspecified type diabetes mellitus without mention of complication, not stated as uncontrolled 2004   Lantus daily.Average fasting blood sugar 125-199    Past Surgical History:  Procedure Laterality Date  . COLONOSCOPY  2013   Dr.John PHenrene Pastor  . CORONARY ARTERY BYPASS GRAFT    . laproscopic cholecystectomy  8/07  . left intertrochanteric hip fracture  2004  s/p intermedullary nail placement 2/08  . left transverse mandibular fracture  10/05  . LUMBAR LAMINECTOMY/DECOMPRESSION MICRODISCECTOMY N/A 02/29/2016   Procedure: Left L4-5 Lateral Recess Decompression, Removal Extradural Intraspinal Facet Cyst;  Surgeon: Marybelle Killings, MD;  Location: [place];  Service: Orthopedics;  Laterality: N/A;    Patient Care Team: Gildardo Cranker, DO as PCP - General (Internal Medicine) [name], MD as PCP - Infectious Diseases (Infectious Diseases) [name], MD as Consulting Physician (Ophthalmology) [name], MD as Consulting Physician (Cardiology) [name], MD as Consulting Physician (Infectious Diseases) Gardiner Barefoot, DPM as Consulting Physician (Podiatry)  Social History   Social History  . Marital status: Single    Spouse name: N/A  . Number of children: 1  . Years of education: N/A    Occupational History  . retired   . part time- church    Social History Main Topics  . Smoking status: Current Every Day Smoker    Packs/day: 0.50    Years: 41.00    Types: Cigarettes  . Smokeless tobacco: Never Used  . Alcohol use 3.6 oz/week    6 Standard drinks or equivalent per week     Comment: occ.  . Drug use: No     Comment: hx of crack/cocaine 25yr ago  . Sexual activity: Not Currently     Comment: declined condoms   Other Topics Concern  . Not on file   Social History Narrative   Retired.       As of 05/2015:   Diet: No salt    Caffeine   Married: Single    House: Condo, 2 stories, 1 person (self)    Pets: No    Current/Past profession: N/A   Exercise: walks daily    Living Will: Yes    DNR   POA/HPOA: No            reports that he has been smoking Cigarettes.  He has a 20.50 pack-year smoking history. He has never used smokeless tobacco. He reports that he drinks about 3.6 oz of alcohol per week . He reports that he does not use drugs.  Family History  Problem Relation Age of Onset  . Heart failure Father   . Hypertension Father   . Diabetes Brother   . Heart attack Brother   . Alzheimer's disease Mother   . Stroke Sister   . Diabetes Sister   . Alzheimer's disease Sister   . Hypertension Brother   . Diabetes Brother   . Drug abuse Brother   . Colon cancer Neg Hx    Family Status  Relation Status  . Father Deceased       heart failure  . Brother Alive  . Brother Alive  . Mother Deceased  . Sister Alive  . Brother Deceased       MI; also did cocaine   . Sister Alive  . Sister Alive  . Brother (Not Specified)  . Brother (Not Specified)  . MGM Deceased  . MGF Deceased  . PGM Deceased  . PGF Deceased  . Neg Hx (Not Specified)     Allergies  Allergen Reactions  . Amphetamines Other (See Comments)    Pt is unaware of this -     Medications: Patient's Medications  New Prescriptions   No medications on file  Previous  Medications   ALLOPURINOL (ZYLOPRIM) 100 MG TABLET    take 1 tablet by mouth once daily   ANORO ELLIPTA 62.5-25 MCG/INH AEPB  INHALE 1 PUFF INTO THE LUNGS DAILY   ASPIRIN EC 81 MG TABLET    Take 81 mg by mouth daily.   ATORVASTATIN (LIPITOR) 20 MG TABLET    take 1 tablet by mouth once daily   B-D UF III MINI PEN NEEDLES 31G X 5 MM MISC    USE 4 TIMES DAILY   CARVEDILOL (COREG) 25 MG TABLET    take 1 tablet by mouth twice a day with meals   CHOLINE FENOFIBRATE (FENOFIBRIC ACID) 135 MG CPDR    take 1 capsule by mouth once daily   COLCHICINE 0.6 MG TABLET    take 1 tablet by mouth CONTINUOUS AS NEEDED FOR GOUT   ELVITEGRAVIR-COBICISTAT-EMTRICITABINE-TENOFOVIR (GENVOYA) 150-150-200-10 MG TABS TABLET    TAKE 1 TABLET BY MOUTH DAILY WITH BREAKFAST.   FUROSEMIDE (LASIX) 40 MG TABLET    Take 1 tablet (40 mg total) by mouth daily.   GABAPENTIN (NEURONTIN) 300 MG CAPSULE    Take 1 capsule (300 mg total) by mouth 3 (three) times daily.   GLUCOSAMINE-CHONDROIT-VIT C-MN (GLUCOSAMINE CHONDR 1500 COMPLX PO)    Take 2 tablets by mouth daily. Reported on 04/04/2016   HUMALOG KWIKPEN 100 UNIT/ML KIWKPEN    inject 12 units subcutaneously three times a day before meals   HYDRALAZINE (APRESOLINE) 25 MG TABLET    take 1 tablet by mouth three times a day   ISOSORBIDE MONONITRATE (IMDUR) 30 MG 24 HR TABLET    Take 30 mg by mouth daily. Reported on 04/04/2016   LATANOPROST (XALATAN) 0.005 % OPHTHALMIC SOLUTION    Place 1 drop into both eyes at bedtime. Reported on 04/04/2016   NAFTIFINE HCL 2 % CREA    apply to affected area twice a day   NITROGLYCERIN (NITROSTAT) 0.3 MG SL TABLET    Place 1 tablet (0.3 mg total) under the tongue as needed for chest pain (not to exceed 3 tablets in one day.).   ONE TOUCH ULTRA TEST TEST STRIP    CHECK BLOOD SUGAR TWO TIMES DAILY AS DIRECTED   ONETOUCH DELICA LANCETS 40J MISC    CHECK BLOOD SUGAR TWO TIMES DAILY  Modified Medications   Modified Medication Previous Medication   LANTUS  SOLOSTAR 100 UNIT/ML SOLOSTAR PEN LANTUS SOLOSTAR 100 UNIT/ML Solostar Pen      Inject 70 Units into the skin every morning. 35 units on each side    Inject 70 Units into the skin every morning. 35 units on each side  Discontinued Medications   CEPHALEXIN (KEFLEX) 500 MG CAPSULE    Take 1 capsule (500 mg total) by mouth 3 (three) times daily.    Review of Systems  Genitourinary: Negative for frequency.  Musculoskeletal: Positive for arthralgias.  Skin: Positive for wound.  Neurological: Positive for numbness.  All other systems reviewed and are negative.   Vitals:   03/17/17 1052  BP: 140/78  Pulse: 63  Temp: 98.4 F (36.9 C)  SpO2: 98%  Weight: 196 lb 6.4 oz (89.1 kg)  Height: '6\' 2"'  (1.88 m)   Body mass index is 25.22 kg/m.  Physical Exam  Constitutional: He is oriented to person, place, and time. He appears well-developed and well-nourished.  HENT:  Mouth/Throat: Oropharynx is clear and moist.  No gum swelling/ d/c noted  Eyes: Pupils are equal, round, and reactive to light. No scleral icterus.  Neck: Neck supple. Carotid bruit is not present. No thyromegaly present.  Cardiovascular: Normal rate, regular rhythm, normal heart sounds and intact distal pulses.  Exam reveals no gallop and no friction rub.   No murmur heard. no distal LE swelling. No calf TTP  Pulmonary/Chest: Effort normal and breath sounds normal. He has no wheezes. He has no rales. He exhibits no tenderness.  Abdominal: Soft. Bowel sounds are normal. He exhibits distension. He exhibits no abdominal bruit, no pulsatile midline mass and no mass. There is no tenderness. There is no rebound and no guarding.  Musculoskeletal: He exhibits edema and tenderness.  Lymphadenopathy:    He has no cervical adenopathy.  Neurological: He is alert and oriented to person, place, and time.  No gross neurologic deficits; CN 2-12 grossly intact  Skin: Skin is warm and dry. No rash noted.  Left 1st toe wound dsg c/d/i    Psychiatric: He has a normal mood and affect. His behavior is normal. Judgment and thought content normal.     Labs reviewed: Appointment on 03/15/2017  Component Date Value Ref Range Status  . Sodium 03/15/2017 141  135 - 146 mmol/L Final  . Potassium 03/15/2017 4.5  3.5 - 5.3 mmol/L Final  . Chloride 03/15/2017 112* 98 - 110 mmol/L Final  . CO2 03/15/2017 23  20 - 31 mmol/L Final  . Glucose, Bld 03/15/2017 53* 65 - 99 mg/dL Final  . BUN 03/15/2017 35* 7 - 25 mg/dL Final  . Creat 03/15/2017 2.37* 0.70 - 1.25 mg/dL Final   Comment:   For patients > or = 68 years of age: The upper reference limit for Creatinine is approximately 13% higher for people identified as African-American.     . Total Bilirubin 03/15/2017 0.3  0.2 - 1.2 mg/dL Final  . Alkaline Phosphatase 03/15/2017 65  40 - 115 U/L Final  . AST 03/15/2017 25  10 - 35 U/L Final  . ALT 03/15/2017 20  9 - 46 U/L Final  . Total Protein 03/15/2017 7.5  6.1 - 8.1 g/dL Final  . Albumin 03/15/2017 3.6  3.6 - 5.1 g/dL Final  . Calcium 03/15/2017 8.8  8.6 - 10.3 mg/dL Final  . GFR, Est African American 03/15/2017 31* >=60 mL/min Final  . GFR, Est Non African American 03/15/2017 27* >=60 mL/min Final  . Cholesterol 03/15/2017 178  <200 mg/dL Final  . Triglycerides 03/15/2017 239* <150 mg/dL Final  . HDL 03/15/2017 30* >40 mg/dL Final  . Total CHOL/HDL Ratio 03/15/2017 5.9* <5.0 Ratio Final  . VLDL 03/15/2017 48* <30 mg/dL Final  . LDL Cholesterol 03/15/2017 100* <100 mg/dL Final  . Hgb A1c MFr Bld 03/15/2017 6.4* <5.7 % Final   Comment:   For someone without known diabetes, a hemoglobin A1c value between 5.7% and 6.4% is consistent with prediabetes and should be confirmed with a follow-up test.   For someone with known diabetes, a value <7% indicates that their diabetes is well controlled. A1c targets should be individualized based on duration of diabetes, age, co-morbid conditions and other considerations.   This assay  result is consistent with an increased risk of diabetes.   Currently, no consensus exists regarding use of hemoglobin A1c for diagnosis of diabetes in children.     . Mean Plasma Glucose 03/15/2017 137  mg/dL Final  Lab on 02/08/2017  Component Date Value Ref Range Status  . Glucose 02/08/2017 158* 65 - 99 mg/dL Final  . BUN 02/08/2017 55* 8 - 27 mg/dL Final  . Creatinine, Ser 02/08/2017 3.31* 0.76 - 1.27 mg/dL Final  . GFR calc non Af Amer 02/08/2017 18* >59 mL/min/1.73 Final  . GFR  calc Af Amer 02/08/2017 21* >59 mL/min/1.73 Final  . BUN/Creatinine Ratio 02/08/2017 17  10 - 24 Final  . Sodium 02/08/2017 141  134 - 144 mmol/L Final  . Potassium 02/08/2017 4.8  3.5 - 5.2 mmol/L Final  . Chloride 02/08/2017 102  96 - 106 mmol/L Final  . CO2 02/08/2017 21  18 - 29 mmol/L Final  . Calcium 02/08/2017 9.1  8.6 - 10.2 mg/dL Final  Lab on 01/30/2017  Component Date Value Ref Range Status  . Glucose 01/30/2017 185* 65 - 99 mg/dL Final  . BUN 01/30/2017 49* 8 - 27 mg/dL Final  . Creatinine, Ser 01/30/2017 2.63* 0.76 - 1.27 mg/dL Final  . GFR calc non Af Amer 01/30/2017 24* >59 mL/min/1.73 Final  . GFR calc Af Amer 01/30/2017 28* >59 mL/min/1.73 Final  . BUN/Creatinine Ratio 01/30/2017 19  10 - 24 Final  . Sodium 01/30/2017 144  134 - 144 mmol/L Final  . Potassium 01/30/2017 4.5  3.5 - 5.2 mmol/L Final  . Chloride 01/30/2017 105  96 - 106 mmol/L Final  . CO2 01/30/2017 19  18 - 29 mmol/L Final  . Calcium 01/30/2017 9.1  8.6 - 10.2 mg/dL Final  Office Visit on 01/26/2017  Component Date Value Ref Range Status  . Glucose 01/26/2017 116* 65 - 99 mg/dL Final  . BUN 01/26/2017 56* 8 - 27 mg/dL Final  . Creatinine, Ser 01/26/2017 2.91* 0.76 - 1.27 mg/dL Final  . GFR calc non Af Amer 01/26/2017 21* >59 mL/min/1.73 Final  . GFR calc Af Amer 01/26/2017 24* >59 mL/min/1.73 Final  . BUN/Creatinine Ratio 01/26/2017 19  10 - 24 Final  . Sodium 01/26/2017 142  134 - 144 mmol/L Final  . Potassium  01/26/2017 4.9  3.5 - 5.2 mmol/L Final  . Chloride 01/26/2017 104  96 - 106 mmol/L Final  . CO2 01/26/2017 24  18 - 29 mmol/L Final  . Calcium 01/26/2017 9.0  8.6 - 10.2 mg/dL Final  Office Visit on 01/17/2017  Component Date Value Ref Range Status  . Glucose 01/17/2017 163* 65 - 99 mg/dL Final  . BUN 01/17/2017 40* 8 - 27 mg/dL Final  . Creatinine, Ser 01/17/2017 2.40* 0.76 - 1.27 mg/dL Final  . GFR calc non Af Amer 01/17/2017 27* >59 mL/min/1.73 Final  . GFR calc Af Amer 01/17/2017 31* >59 mL/min/1.73 Final  . BUN/Creatinine Ratio 01/17/2017 17  10 - 24 Final  . Sodium 01/17/2017 142  134 - 144 mmol/L Final  . Potassium 01/17/2017 4.6  3.5 - 5.2 mmol/L Final  . Chloride 01/17/2017 105  96 - 106 mmol/L Final  . CO2 01/17/2017 21  18 - 29 mmol/L Final  . Calcium 01/17/2017 8.9  8.6 - 10.2 mg/dL Final  . NT-Pro BNP 01/17/2017 1640* 0 - 376 pg/mL Final   Comment: The following cut-points have been suggested for the use of proBNP for the diagnostic evaluation of heart failure (HF) in patients with acute dyspnea: Modality                     Age           Optimal Cut                            (years)            Point ------------------------------------------------------ Diagnosis (rule in HF)        <50  450 pg/mL                           50 - 75            900 pg/mL                               >75           1800 pg/mL Exclusion (rule out HF)  Age independent     300 pg/mL     Dg Foot Complete Left  Result Date: 02/16/2017 CLINICAL DATA:  Plantar surface wound medially.  Diabetes mellitus. EXAM: LEFT FOOT - COMPLETE 3+ VIEW COMPARISON:  None. FINDINGS: Frontal, oblique, and lateral views obtained. There is soft tissue lucency along the volar aspect of the first distal phalanx, felt to represent soft tissue ulceration. No radiopaque foreign body in this area. There is no fracture or dislocation. No erosive change or bony destruction. There is moderate osteoarthritic change  in the first MTP joint with bony overgrowth of the first distal metatarsal and bunion formation. There is hallux valgus deformity at the first MTP joint. Other joint spaces appear unremarkable. There is a minimal posterior calcaneal spur. IMPRESSION: Evidence of soft tissue ulceration volar to the first distal phalanx. No bony destruction or erosion. Osteoarthritic change with hallux valgus deformity at the first MTP joint. Bunion formation is noted in this area. No fracture or dislocation. Other joint spaces appear unremarkable. There is a minimal posterior calcaneal spur. Electronically Signed   By: Lowella Grip III M.D.   On: 02/16/2017 10:08     Assessment/Plan   ICD-9-CM ICD-10-CM   1. Chronic kidney disease, stage III (moderate) 585.3 N18.3 CMP with eGFR  2. Essential hypertension 401.9 I10   3. Tobacco abuse 305.1 Z72.0   4. Mixed hyperlipidemia 272.2 E78.2 Lipid Panel     TSH  5. Chronic combined systolic and diastolic congestive heart failure (HCC) 428.42 I50.42    428.0    6. Diabetic ulcer of toe of left foot associated with type 2 diabetes mellitus, with other ulcer severity ([place]) 250.80 E11.621 Hemoglobin A1c   707.15 L97.528    May use nicotine gum for smoking cessation  Continue other medications as ordered  Follow up with specialists as scheduled - including wound center  Follow up in 4 mos for AWV/CPE. Fasting labs prior to appt  Axtell S. Perlie Gold  [facility] [address] [place], [place] [address] [phone] Cell (Monday-Friday 8 AM - 5 PM) [phone] After 5 PM and follow prompts

## 2017-03-23 DIAGNOSIS — L97522 Non-pressure chronic ulcer of other part of left foot with fat layer exposed: Secondary | ICD-10-CM | POA: Diagnosis not present

## 2017-03-23 DIAGNOSIS — M1990 Unspecified osteoarthritis, unspecified site: Secondary | ICD-10-CM | POA: Diagnosis not present

## 2017-03-23 DIAGNOSIS — I2510 Atherosclerotic heart disease of native coronary artery without angina pectoris: Secondary | ICD-10-CM | POA: Diagnosis not present

## 2017-03-23 DIAGNOSIS — H269 Unspecified cataract: Secondary | ICD-10-CM | POA: Diagnosis not present

## 2017-03-23 DIAGNOSIS — M109 Gout, unspecified: Secondary | ICD-10-CM | POA: Diagnosis not present

## 2017-03-23 DIAGNOSIS — I110 Hypertensive heart disease with heart failure: Secondary | ICD-10-CM | POA: Diagnosis not present

## 2017-03-23 DIAGNOSIS — I252 Old myocardial infarction: Secondary | ICD-10-CM | POA: Diagnosis not present

## 2017-03-23 DIAGNOSIS — L89893 Pressure ulcer of other site, stage 3: Secondary | ICD-10-CM | POA: Diagnosis not present

## 2017-03-23 DIAGNOSIS — L84 Corns and callosities: Secondary | ICD-10-CM | POA: Diagnosis not present

## 2017-03-23 DIAGNOSIS — J449 Chronic obstructive pulmonary disease, unspecified: Secondary | ICD-10-CM | POA: Diagnosis not present

## 2017-03-23 DIAGNOSIS — E1140 Type 2 diabetes mellitus with diabetic neuropathy, unspecified: Secondary | ICD-10-CM | POA: Diagnosis not present

## 2017-03-23 DIAGNOSIS — H409 Unspecified glaucoma: Secondary | ICD-10-CM | POA: Diagnosis not present

## 2017-03-23 DIAGNOSIS — I509 Heart failure, unspecified: Secondary | ICD-10-CM | POA: Diagnosis not present

## 2017-03-23 DIAGNOSIS — E11621 Type 2 diabetes mellitus with foot ulcer: Secondary | ICD-10-CM | POA: Diagnosis not present

## 2017-03-23 DIAGNOSIS — D649 Anemia, unspecified: Secondary | ICD-10-CM | POA: Diagnosis not present

## 2017-03-27 ENCOUNTER — Ambulatory Visit (INDEPENDENT_AMBULATORY_CARE_PROVIDER_SITE_OTHER): Payer: Medicare Other | Admitting: Podiatry

## 2017-03-27 ENCOUNTER — Encounter: Payer: Self-pay | Admitting: Podiatry

## 2017-03-27 ENCOUNTER — Encounter: Payer: Self-pay | Admitting: Internal Medicine

## 2017-03-27 ENCOUNTER — Ambulatory Visit: Payer: Medicare Other | Admitting: Pharmacotherapy

## 2017-03-27 ENCOUNTER — Ambulatory Visit: Payer: Medicare Other | Admitting: Podiatry

## 2017-03-27 DIAGNOSIS — L84 Corns and callosities: Secondary | ICD-10-CM | POA: Diagnosis not present

## 2017-03-27 DIAGNOSIS — M79676 Pain in unspecified toe(s): Secondary | ICD-10-CM | POA: Diagnosis not present

## 2017-03-27 DIAGNOSIS — L97512 Non-pressure chronic ulcer of other part of right foot with fat layer exposed: Secondary | ICD-10-CM

## 2017-03-27 DIAGNOSIS — B351 Tinea unguium: Secondary | ICD-10-CM

## 2017-03-27 DIAGNOSIS — I70245 Atherosclerosis of native arteries of left leg with ulceration of other part of foot: Secondary | ICD-10-CM

## 2017-03-27 DIAGNOSIS — E0842 Diabetes mellitus due to underlying condition with diabetic polyneuropathy: Secondary | ICD-10-CM | POA: Diagnosis not present

## 2017-03-27 DIAGNOSIS — I70235 Atherosclerosis of native arteries of right leg with ulceration of other part of foot: Secondary | ICD-10-CM

## 2017-03-27 NOTE — Progress Notes (Signed)
   Subjective:  Patient with a history of diabetes mellitus presents today for follow-up evaluation of an ulcer to the left great toe. Patient has been going for wound care every Thursday. Patient also complains of calluses to the left plantar forefoot that appeared about two months ago and thickened, elongated, fungal nails bilaterally.   Objective/Physical Exam General: The patient is alert and oriented x3 in no acute distress.  Dermatology:  Wound #1 noted to the right forefoot measuring [phone] cm (LxWxD).   Wound #2 noted to the left great toe plantar measuring [phone] cm.  To the noted ulceration(s), there is no eschar. There is a moderate amount of slough, fibrin, and necrotic tissue noted. Granulation tissue and wound base is red. There is a minimal amount of serosanguineous drainage noted. There is no exposed bone muscle-tendon ligament or joint. There is no malodor. Periwound integrity is intact. Skin is warm, dry and supple bilateral lower extremities. Painful hyperkeratotic callus lesions are also noted to the weightbearing surfaces the bilateral foot 6  Elongated, thickened, dystrophic nails also noted 1-5 bilateral. Painful to palpation.  Symptomatic hyperkeratotic pre-ulcerative lesions also noted to the weightbearing surfaces of the bilateral feet 6  Vascular: Palpable pedal pulses bilaterally. No edema or erythema noted. Capillary refill within normal limits.  Neurological: Epicritic and protective threshold absent bilaterally.   Musculoskeletal Exam: Range of motion within normal limits to all pedal and ankle joints bilateral. Muscle strength 5/5 in all groups bilateral.   Assessment: #1 Onychomycosis of nail due to dermatophyte bilateral #2 porokeratosis bilaterally 6 #3 diabetic ulcer right forefoot  #4 diabetic ulcer left great toe  Plan of Care:  #1 Patient was evaluated. #2 medically necessary excisional debridement including subcutaneous tissue was  performed using a tissue nipper and a chisel blade. Excisional debridement of all the necrotic nonviable tissue down to healthy bleeding viable tissue was performed with post-debridement measurements same as pre-. #3 the wound was cleansed and dry sterile dressing applied. #4 mechanical debridement of nails 1-5 bilaterally was performed using a nail nipper without incident or bleeding. #5 excisional debridement of pre-ulcerative callus x 6 lesions was performed using a chisel blade without incident or bleeding. #6 return to clinic in 3 months.   Edrick Kins, DPM [facility]  Dr. Edrick Kins, [address]                                        [place], [place] [address]                Office [phone]  Fax [phone]

## 2017-03-30 ENCOUNTER — Encounter (HOSPITAL_BASED_OUTPATIENT_CLINIC_OR_DEPARTMENT_OTHER): Payer: Medicare Other | Attending: Internal Medicine

## 2017-03-30 DIAGNOSIS — I252 Old myocardial infarction: Secondary | ICD-10-CM | POA: Insufficient documentation

## 2017-03-30 DIAGNOSIS — L84 Corns and callosities: Secondary | ICD-10-CM | POA: Insufficient documentation

## 2017-03-30 DIAGNOSIS — J449 Chronic obstructive pulmonary disease, unspecified: Secondary | ICD-10-CM | POA: Diagnosis not present

## 2017-03-30 DIAGNOSIS — L97522 Non-pressure chronic ulcer of other part of left foot with fat layer exposed: Secondary | ICD-10-CM | POA: Diagnosis not present

## 2017-03-30 DIAGNOSIS — I2510 Atherosclerotic heart disease of native coronary artery without angina pectoris: Secondary | ICD-10-CM | POA: Insufficient documentation

## 2017-03-30 DIAGNOSIS — E1140 Type 2 diabetes mellitus with diabetic neuropathy, unspecified: Secondary | ICD-10-CM | POA: Diagnosis not present

## 2017-03-30 DIAGNOSIS — E11621 Type 2 diabetes mellitus with foot ulcer: Secondary | ICD-10-CM | POA: Diagnosis not present

## 2017-03-30 DIAGNOSIS — I10 Essential (primary) hypertension: Secondary | ICD-10-CM | POA: Diagnosis not present

## 2017-03-30 DIAGNOSIS — Z794 Long term (current) use of insulin: Secondary | ICD-10-CM | POA: Diagnosis not present

## 2017-03-30 DIAGNOSIS — F17210 Nicotine dependence, cigarettes, uncomplicated: Secondary | ICD-10-CM | POA: Insufficient documentation

## 2017-04-06 DIAGNOSIS — L97522 Non-pressure chronic ulcer of other part of left foot with fat layer exposed: Secondary | ICD-10-CM | POA: Diagnosis not present

## 2017-04-06 DIAGNOSIS — L84 Corns and callosities: Secondary | ICD-10-CM | POA: Diagnosis not present

## 2017-04-06 DIAGNOSIS — E1140 Type 2 diabetes mellitus with diabetic neuropathy, unspecified: Secondary | ICD-10-CM | POA: Diagnosis not present

## 2017-04-06 DIAGNOSIS — I2510 Atherosclerotic heart disease of native coronary artery without angina pectoris: Secondary | ICD-10-CM | POA: Diagnosis not present

## 2017-04-06 DIAGNOSIS — E11621 Type 2 diabetes mellitus with foot ulcer: Secondary | ICD-10-CM | POA: Diagnosis not present

## 2017-04-06 DIAGNOSIS — Z794 Long term (current) use of insulin: Secondary | ICD-10-CM | POA: Diagnosis not present

## 2017-04-06 DIAGNOSIS — I10 Essential (primary) hypertension: Secondary | ICD-10-CM | POA: Diagnosis not present

## 2017-04-06 DIAGNOSIS — J449 Chronic obstructive pulmonary disease, unspecified: Secondary | ICD-10-CM | POA: Diagnosis not present

## 2017-04-06 DIAGNOSIS — I252 Old myocardial infarction: Secondary | ICD-10-CM | POA: Diagnosis not present

## 2017-04-13 ENCOUNTER — Other Ambulatory Visit (HOSPITAL_COMMUNITY)
Admission: RE | Admit: 2017-04-13 | Discharge: 2017-04-13 | Disposition: A | Discharge status: Home / Self Care | Payer: Medicare Other | Source: Other Acute Inpatient Hospital | Attending: Internal Medicine | Admitting: Internal Medicine

## 2017-04-13 DIAGNOSIS — L84 Corns and callosities: Secondary | ICD-10-CM | POA: Diagnosis not present

## 2017-04-13 DIAGNOSIS — I10 Essential (primary) hypertension: Secondary | ICD-10-CM | POA: Diagnosis not present

## 2017-04-13 DIAGNOSIS — Z794 Long term (current) use of insulin: Secondary | ICD-10-CM | POA: Diagnosis not present

## 2017-04-13 DIAGNOSIS — E1140 Type 2 diabetes mellitus with diabetic neuropathy, unspecified: Secondary | ICD-10-CM | POA: Diagnosis not present

## 2017-04-13 DIAGNOSIS — L97522 Non-pressure chronic ulcer of other part of left foot with fat layer exposed: Secondary | ICD-10-CM | POA: Diagnosis not present

## 2017-04-13 DIAGNOSIS — E11621 Type 2 diabetes mellitus with foot ulcer: Secondary | ICD-10-CM | POA: Insufficient documentation

## 2017-04-13 DIAGNOSIS — I252 Old myocardial infarction: Secondary | ICD-10-CM | POA: Diagnosis not present

## 2017-04-13 DIAGNOSIS — J449 Chronic obstructive pulmonary disease, unspecified: Secondary | ICD-10-CM | POA: Diagnosis not present

## 2017-04-13 DIAGNOSIS — I2510 Atherosclerotic heart disease of native coronary artery without angina pectoris: Secondary | ICD-10-CM | POA: Diagnosis not present

## 2017-04-14 ENCOUNTER — Encounter: Payer: Self-pay | Admitting: Internal Medicine

## 2017-04-14 ENCOUNTER — Ambulatory Visit (INDEPENDENT_AMBULATORY_CARE_PROVIDER_SITE_OTHER): Payer: Medicare Other | Admitting: Internal Medicine

## 2017-04-14 ENCOUNTER — Ambulatory Visit: Payer: Self-pay | Admitting: Nurse Practitioner

## 2017-04-14 VITALS — BP 120/74 | HR 61 | Temp 97.2°F | Ht 74.0 in | Wt 196.8 lb

## 2017-04-14 DIAGNOSIS — B20 Human immunodeficiency virus [HIV] disease: Secondary | ICD-10-CM | POA: Diagnosis not present

## 2017-04-14 DIAGNOSIS — Z794 Long term (current) use of insulin: Secondary | ICD-10-CM

## 2017-04-14 DIAGNOSIS — K1121 Acute sialoadenitis: Secondary | ICD-10-CM

## 2017-04-14 DIAGNOSIS — E1140 Type 2 diabetes mellitus with diabetic neuropathy, unspecified: Secondary | ICD-10-CM | POA: Diagnosis not present

## 2017-04-14 MED ORDER — CIPROFLOXACIN HCL 500 MG PO TABS: 500.0000 mg | ORAL_TABLET | Freq: Two times a day (BID) | ORAL | 0 refills | Status: DC

## 2017-04-14 MED ORDER — CLINDAMYCIN HCL 150 MG PO CAPS: 450.0000 mg | ORAL_CAPSULE | Freq: Three times a day (TID) | ORAL | 0 refills | Status: DC

## 2017-04-14 NOTE — Progress Notes (Signed)
[name], male   DOB: 11/19/1950, 66 y.o.   MRN: [id number]    Location:  [facility] Place of Service: OFFICE  Chief Complaint  Patient presents with  . Acute Visit    Left Jaw Pain and swelling, no teeth on left side, jaw has been broken before around 2004/2005, patient feels there is an an infection somewhere because BS reading has been a little elevated. also stated that he had pain when cleaning out ear with Q-Tip.     HPI:  66 yo male seen today for left jaw pain x 2 days. BS in 200 range at home. No f/c. Painful chewing. Feels swollen. No sinus pain. No rhinorrhea. No post nasal drip. No sore throat. No HA. No swollen gland. No pain relief with Tylenol.   Tob abuse - still smoking but down to < 1/2 ppd. He would like to start using nicotine gum.   HIV - stable on HAART tx. HIV1 RNA quant <20; log <1.3. CD4 abs 1330. Followed by ID Dr [name] annually  HTN/CHF - BP stable on hydralazine, coreg, imdur. Off ARB since previous hospital d/c. Followed by Dr [name]   DM - controlled. He takes 1/2 dose lantus on each side of abdomen.  No low BS reactions. Numbness/tingling in feet. Takes Lantus  insulin 70 units qhs and humalog 12 units qAC . Followed by podiatry. No ulcerations but has calluses. A1c 6.4%. He is not on ARB due to AKI and hyperkalemia. He has an eye specialist. He has diabetic foot ulcer  CKD  - borderline stage 3. Cr 2.37. Followed by nephrology Dr [name]  Past Medical History:  Diagnosis Date  . Arthritis   . Cataracts, bilateral    immature  . CHF (congestive heart failure) (HCC)    not on any meds  . Chronic back pain    stenosis  . Chronic kidney disease, unspecified   . Coronary atherosclerosis of artery bypass graft   . Coronary atherosclerosis of native coronary artery 2005   s/p surgery  . Drug abuse    hx; tested for cocaine as recently as 2/08. says he is not using drugs now - avoided defib. for this reason   . GERD (gastroesophageal reflux  disease)    takes OTC meds as needed  . Glaucoma    uses eye drops daily  . Gout, unspecified    takes Allopurinol daily as well as Colchicine-if needed  . Hepatitis 1967   Hep C  . History of colon polyps    benign  . HTN (hypertension)    takes Coreg,Imdur.and Apresoline daily  . Human immunodeficiency virus (HIV) disease ([place])    takes Genvoya daily  . Hyperlipidemia    takes Atorvastatin daily  . Ischemic cardiomyopathy   . Muscle spasm    takes Zanaflex as needed  . Nocturia   . Peripheral neuropathy    takes gabapentin daily  . Pneumonia    hx of  . Shortness of breath dyspnea    rarely but if notices it then with exertion  . Syphilis, unspecified   . Type II or unspecified type diabetes mellitus without mention of complication, not stated as uncontrolled 2004   Lantus daily.Average fasting blood sugar 125-199    Past Surgical History:  Procedure Laterality Date  . COLONOSCOPY  2013   Dr.John Henrene Pastor   . CORONARY ARTERY BYPASS GRAFT    . laproscopic cholecystectomy  8/07  . left intertrochanteric hip fracture  2004  s/p intermedullary nail placement 2/08  . left transverse mandibular fracture  10/05  . LUMBAR LAMINECTOMY/DECOMPRESSION MICRODISCECTOMY N/A 02/29/2016   Procedure: Left L4-5 Lateral Recess Decompression, Removal Extradural Intraspinal Facet Cyst;  Surgeon: Marybelle Killings, MD;  Location: [place];  Service: Orthopedics;  Laterality: N/A;    Patient Care Team: Gildardo Cranker, DO as PCP - General (Internal Medicine) [name], MD as PCP - Infectious Diseases (Infectious Diseases) [name], MD as Consulting Physician (Ophthalmology) [name], MD as Consulting Physician (Cardiology) [name], MD as Consulting Physician (Infectious Diseases) Gardiner Barefoot, DPM as Consulting Physician (Podiatry)  Social History   Social History  . Marital status: Single    Spouse name: N/A  . Number of children: 1  . Years of education: N/A    Occupational History  . retired   . part time- church    Social History Main Topics  . Smoking status: Current Every Day Smoker    Packs/day: 0.50    Years: 41.00    Types: Cigarettes  . Smokeless tobacco: Never Used  . Alcohol use 3.6 oz/week    6 Standard drinks or equivalent per week     Comment: occ.  . Drug use: No     Comment: hx of crack/cocaine 25yr ago  . Sexual activity: Not Currently     Comment: declined condoms   Other Topics Concern  . Not on file   Social History Narrative   Retired.       As of 05/2015:   Diet: No salt    Caffeine   Married: Single    House: Condo, 2 stories, 1 person (self)    Pets: No    Current/Past profession: N/A   Exercise: walks daily    Living Will: Yes    DNR   POA/HPOA: No            reports that he has been smoking Cigarettes.  He has a 20.50 pack-year smoking history. He has never used smokeless tobacco. He reports that he drinks about 3.6 oz of alcohol per week . He reports that he does not use drugs.  Family History  Problem Relation Age of Onset  . Heart failure Father   . Hypertension Father   . Diabetes Brother   . Heart attack Brother   . Alzheimer's disease Mother   . Stroke Sister   . Diabetes Sister   . Alzheimer's disease Sister   . Hypertension Brother   . Diabetes Brother   . Drug abuse Brother   . Colon cancer Neg Hx    Family Status  Relation Status  . Father Deceased       heart failure  . Brother Alive  . Brother Alive  . Mother Deceased  . Sister Alive  . Brother Deceased       MI; also did cocaine   . Sister Alive  . Sister Alive  . Brother (Not Specified)  . Brother (Not Specified)  . MGM Deceased  . MGF Deceased  . PGM Deceased  . PGF Deceased  . Neg Hx (Not Specified)     Allergies  Allergen Reactions  . Amphetamines Other (See Comments)    Pt is unaware of this -     Medications: Patient's Medications  New Prescriptions   No medications on file  Previous  Medications   ALLOPURINOL (ZYLOPRIM) 100 MG TABLET    take 1 tablet by mouth once daily   ANORO ELLIPTA 62.5-25 MCG/INH AEPB  INHALE 1 PUFF INTO THE LUNGS DAILY   ASPIRIN EC 81 MG TABLET    Take 81 mg by mouth daily.   ATORVASTATIN (LIPITOR) 20 MG TABLET    take 1 tablet by mouth once daily   B-D UF III MINI PEN NEEDLES 31G X 5 MM MISC    USE 4 TIMES DAILY   CARVEDILOL (COREG) 25 MG TABLET    take 1 tablet by mouth twice a day with meals   CHOLINE FENOFIBRATE (FENOFIBRIC ACID) 135 MG CPDR    take 1 capsule by mouth once daily   COLCHICINE 0.6 MG TABLET    take 1 tablet by mouth CONTINUOUS AS NEEDED FOR GOUT   ELVITEGRAVIR-COBICISTAT-EMTRICITABINE-TENOFOVIR (GENVOYA) 150-150-200-10 MG TABS TABLET    TAKE 1 TABLET BY MOUTH DAILY WITH BREAKFAST.   FUROSEMIDE (LASIX) 40 MG TABLET    Take 1 tablet (40 mg total) by mouth daily.   GABAPENTIN (NEURONTIN) 300 MG CAPSULE    Take 1 capsule (300 mg total) by mouth 3 (three) times daily.   GLUCOSAMINE-CHONDROIT-VIT C-MN (GLUCOSAMINE CHONDR 1500 COMPLX PO)    Take 2 tablets by mouth daily. Reported on 04/04/2016   HUMALOG KWIKPEN 100 UNIT/ML KIWKPEN    inject 12 units subcutaneously three times a day before meals   HYDRALAZINE (APRESOLINE) 25 MG TABLET    take 1 tablet by mouth three times a day   ISOSORBIDE MONONITRATE (IMDUR) 30 MG 24 HR TABLET    Take 30 mg by mouth daily. Reported on 04/04/2016   LANTUS SOLOSTAR 100 UNIT/ML SOLOSTAR PEN    Inject 70 Units into the skin every morning. 35 units on each side   LATANOPROST (XALATAN) 0.005 % OPHTHALMIC SOLUTION    Place 1 drop into both eyes at bedtime. Reported on 04/04/2016   NAFTIFINE HCL 2 % CREA    apply to affected area twice a day   NITROGLYCERIN (NITROSTAT) 0.3 MG SL TABLET    Place 1 tablet (0.3 mg total) under the tongue as needed for chest pain (not to exceed 3 tablets in one day.).   ONE TOUCH ULTRA TEST TEST STRIP    CHECK BLOOD SUGAR TWO TIMES DAILY AS DIRECTED   ONETOUCH DELICA LANCETS 78L MISC     CHECK BLOOD SUGAR TWO TIMES DAILY  Modified Medications   No medications on file  Discontinued Medications   No medications on file    Review of Systems  HENT: Positive for dental problem.   Musculoskeletal: Positive for arthralgias.  All other systems reviewed and are negative.   Vitals:   04/14/17 1140  BP: 120/74  Pulse: 61  Temp: 97.2 F (36.2 C)  TempSrc: Oral  SpO2: 98%  Weight: 196 lb 12.8 oz (89.3 kg)  Height: 6\' 2"  (1.88 m)   Body mass index is 25.27 kg/m.  Physical Exam  Constitutional: He is oriented to person, place, and time. He appears well-developed and well-nourished.  HENT:  Head:    Mouth/Throat: Oropharynx is clear and moist and mucous membranes are normal. He has dentures.  Left parotid gland swelling with enlarged ductal orifice. No d/c but erythema present; left TM red but no bulging or d/c. TM intact; right TM appears nml; no TMJ TTP b/l  Eyes: Pupils are equal, round, and reactive to light. Right eye exhibits no discharge. Left eye exhibits no discharge. No scleral icterus.  Neck: Neck supple.  Pulmonary/Chest: No stridor.  Musculoskeletal: He exhibits edema.  Lymphadenopathy:    He has cervical adenopathy (left).  Neurological: He is alert and oriented to person, place, and time.  Skin: Skin is warm and dry. No rash noted.  Psychiatric: He has a normal mood and affect. His behavior is normal. Thought content normal.     Labs reviewed: Hospital Outpatient Visit on 04/13/2017  Component Date Value Ref Range Status  . Specimen Description 04/13/2017 TOE LEFT   Final  . Special Requests 04/13/2017 NONE   Final  . Gram Stain 04/13/2017    Final                   Value:FEW WBC PRESENT,BOTH PMN AND MONONUCLEAR ABUNDANT GRAM POSITIVE COCCI IN PAIRS Performed at [facility], [address] [place], [place] [address]   . Culture 04/13/2017 PENDING   Incomplete  . Report Status 04/13/2017 PENDING   Incomplete  Appointment on 03/15/2017   Component Date Value Ref Range Status  . Sodium 03/15/2017 141  135 - 146 mmol/L Final  . Potassium 03/15/2017 4.5  3.5 - 5.3 mmol/L Final  . Chloride 03/15/2017 112* 98 - 110 mmol/L Final  . CO2 03/15/2017 23  20 - 31 mmol/L Final  . Glucose, Bld 03/15/2017 53* 65 - 99 mg/dL Final  . BUN 03/15/2017 35* 7 - 25 mg/dL Final  . Creat 03/15/2017 2.37* 0.70 - 1.25 mg/dL Final   Comment:   For patients > or = 68 years of age: The upper reference limit for Creatinine is approximately 13% higher for people identified as African-American.     . Total Bilirubin 03/15/2017 0.3  0.2 - 1.2 mg/dL Final  . Alkaline Phosphatase 03/15/2017 65  40 - 115 U/L Final  . AST 03/15/2017 25  10 - 35 U/L Final  . ALT 03/15/2017 20  9 - 46 U/L Final  . Total Protein 03/15/2017 7.5  6.1 - 8.1 g/dL Final  . Albumin 03/15/2017 3.6  3.6 - 5.1 g/dL Final  . Calcium 03/15/2017 8.8  8.6 - 10.3 mg/dL Final  . GFR, Est African American 03/15/2017 31* >=60 mL/min Final  . GFR, Est Non African American 03/15/2017 27* >=60 mL/min Final  . Cholesterol 03/15/2017 178  <200 mg/dL Final  . Triglycerides 03/15/2017 239* <150 mg/dL Final  . HDL 03/15/2017 30* >40 mg/dL Final  . Total CHOL/HDL Ratio 03/15/2017 5.9* <5.0 Ratio Final  . VLDL 03/15/2017 48* <30 mg/dL Final  . LDL Cholesterol 03/15/2017 100* <100 mg/dL Final  . Hgb A1c MFr Bld 03/15/2017 6.4* <5.7 % Final   Comment:   For someone without known diabetes, a hemoglobin A1c value between 5.7% and 6.4% is consistent with prediabetes and should be confirmed with a follow-up test.   For someone with known diabetes, a value <7% indicates that their diabetes is well controlled. A1c targets should be individualized based on duration of diabetes, age, co-morbid conditions and other considerations.   This assay result is consistent with an increased risk of diabetes.   Currently, no consensus exists regarding use of hemoglobin A1c for diagnosis of diabetes in  children.     . Mean Plasma Glucose 03/15/2017 137  mg/dL Final  Lab on 02/08/2017  Component Date Value Ref Range Status  . Glucose 02/08/2017 158* 65 - 99 mg/dL Final  . BUN 02/08/2017 55* 8 - 27 mg/dL Final  . Creatinine, Ser 02/08/2017 3.31* 0.76 - 1.27 mg/dL Final  . GFR calc non Af Amer 02/08/2017 18* >59 mL/min/1.73 Final  . GFR calc Af Amer 02/08/2017 21* >59 mL/min/1.73 Final  .  BUN/Creatinine Ratio 02/08/2017 17  10 - 24 Final  . Sodium 02/08/2017 141  134 - 144 mmol/L Final  . Potassium 02/08/2017 4.8  3.5 - 5.2 mmol/L Final  . Chloride 02/08/2017 102  96 - 106 mmol/L Final  . CO2 02/08/2017 21  18 - 29 mmol/L Final  . Calcium 02/08/2017 9.1  8.6 - 10.2 mg/dL Final  Lab on 01/30/2017  Component Date Value Ref Range Status  . Glucose 01/30/2017 185* 65 - 99 mg/dL Final  . BUN 01/30/2017 49* 8 - 27 mg/dL Final  . Creatinine, Ser 01/30/2017 2.63* 0.76 - 1.27 mg/dL Final  . GFR calc non Af Amer 01/30/2017 24* >59 mL/min/1.73 Final  . GFR calc Af Amer 01/30/2017 28* >59 mL/min/1.73 Final  . BUN/Creatinine Ratio 01/30/2017 19  10 - 24 Final  . Sodium 01/30/2017 144  134 - 144 mmol/L Final  . Potassium 01/30/2017 4.5  3.5 - 5.2 mmol/L Final  . Chloride 01/30/2017 105  96 - 106 mmol/L Final  . CO2 01/30/2017 19  18 - 29 mmol/L Final  . Calcium 01/30/2017 9.1  8.6 - 10.2 mg/dL Final  Office Visit on 01/26/2017  Component Date Value Ref Range Status  . Glucose 01/26/2017 116* 65 - 99 mg/dL Final  . BUN 01/26/2017 56* 8 - 27 mg/dL Final  . Creatinine, Ser 01/26/2017 2.91* 0.76 - 1.27 mg/dL Final  . GFR calc non Af Amer 01/26/2017 21* >59 mL/min/1.73 Final  . GFR calc Af Amer 01/26/2017 24* >59 mL/min/1.73 Final  . BUN/Creatinine Ratio 01/26/2017 19  10 - 24 Final  . Sodium 01/26/2017 142  134 - 144 mmol/L Final  . Potassium 01/26/2017 4.9  3.5 - 5.2 mmol/L Final  . Chloride 01/26/2017 104  96 - 106 mmol/L Final  . CO2 01/26/2017 24  18 - 29 mmol/L Final  . Calcium 01/26/2017  9.0  8.6 - 10.2 mg/dL Final  Office Visit on 01/17/2017  Component Date Value Ref Range Status  . Glucose 01/17/2017 163* 65 - 99 mg/dL Final  . BUN 01/17/2017 40* 8 - 27 mg/dL Final  . Creatinine, Ser 01/17/2017 2.40* 0.76 - 1.27 mg/dL Final  . GFR calc non Af Amer 01/17/2017 27* >59 mL/min/1.73 Final  . GFR calc Af Amer 01/17/2017 31* >59 mL/min/1.73 Final  . BUN/Creatinine Ratio 01/17/2017 17  10 - 24 Final  . Sodium 01/17/2017 142  134 - 144 mmol/L Final  . Potassium 01/17/2017 4.6  3.5 - 5.2 mmol/L Final  . Chloride 01/17/2017 105  96 - 106 mmol/L Final  . CO2 01/17/2017 21  18 - 29 mmol/L Final  . Calcium 01/17/2017 8.9  8.6 - 10.2 mg/dL Final  . NT-Pro BNP 01/17/2017 1640* 0 - 376 pg/mL Final   Comment: The following cut-points have been suggested for the use of proBNP for the diagnostic evaluation of heart failure (HF) in patients with acute dyspnea: Modality                     Age           Optimal Cut                            (years)            Point ------------------------------------------------------ Diagnosis (rule in HF)        <50            450 pg/mL  50 - 75            900 pg/mL                               >75           1800 pg/mL Exclusion (rule out HF)  Age independent     300 pg/mL     No results found.   Assessment/Plan   ICD-10-CM   1. Acute parotitis K11.21 clindamycin (CLEOCIN) 150 MG capsule    ciprofloxacin (CIPRO) 500 MG tablet    Ambulatory referral to ENT  2. Type 2 diabetes mellitus with diabetic neuropathy, with long-term current use of insulin (HCC) E11.40    Z79.4   3. Human immunodeficiency virus (HIV) disease ([place]) B20     Take clinda 450mg  TID x 10 days + Cipro 500mg  po BID x 10 days  Cont other meds as ordered  May apply warm compress as needed to left jaw  Will call with ENT referral   Take both antibiotics as ordered x 10 days  Push fluids and rest  Continue Tylenol ES as needed for pain (up to  4 grams or 4000mg  in 24 hrs)  Follow up as scheduled. If symptoms worsen or do not improve, go to the ER.  Mischa Brittingham S. Perlie Gold  [facility] [address] [place], [place] [address] [phone] Cell (Monday-Friday 8 AM - 5 PM) [phone] After 5 PM and follow prompts

## 2017-04-14 NOTE — Patient Instructions (Signed)
May apply warm compress as needed to left jaw  Will call with ENT referral   Take both antibiotics as ordered x 10 days  Push fluids and rest  Continue Tylenol ES as needed for pain (up to 4 grams or 4000mg  in 24 hrs)  Follow up as scheduled. If symptoms worsen or do not improve, go to the ER.

## 2017-04-16 ENCOUNTER — Other Ambulatory Visit: Payer: Self-pay | Admitting: Internal Medicine

## 2017-04-16 LAB — AEROBIC CULTURE W GRAM STAIN (SUPERFICIAL SPECIMEN)

## 2017-04-16 LAB — AEROBIC CULTURE  (SUPERFICIAL SPECIMEN)

## 2017-04-20 ENCOUNTER — Other Ambulatory Visit: Payer: Self-pay | Admitting: Internal Medicine

## 2017-04-20 DIAGNOSIS — I2510 Atherosclerotic heart disease of native coronary artery without angina pectoris: Secondary | ICD-10-CM | POA: Diagnosis not present

## 2017-04-20 DIAGNOSIS — L84 Corns and callosities: Secondary | ICD-10-CM | POA: Diagnosis not present

## 2017-04-20 DIAGNOSIS — Z794 Long term (current) use of insulin: Secondary | ICD-10-CM | POA: Diagnosis not present

## 2017-04-20 DIAGNOSIS — L97522 Non-pressure chronic ulcer of other part of left foot with fat layer exposed: Secondary | ICD-10-CM | POA: Diagnosis not present

## 2017-04-20 DIAGNOSIS — E1140 Type 2 diabetes mellitus with diabetic neuropathy, unspecified: Secondary | ICD-10-CM | POA: Diagnosis not present

## 2017-04-20 DIAGNOSIS — I10 Essential (primary) hypertension: Secondary | ICD-10-CM | POA: Diagnosis not present

## 2017-04-20 DIAGNOSIS — I252 Old myocardial infarction: Secondary | ICD-10-CM | POA: Diagnosis not present

## 2017-04-20 DIAGNOSIS — E11621 Type 2 diabetes mellitus with foot ulcer: Secondary | ICD-10-CM | POA: Diagnosis not present

## 2017-04-20 DIAGNOSIS — J449 Chronic obstructive pulmonary disease, unspecified: Secondary | ICD-10-CM | POA: Diagnosis not present

## 2017-04-21 DIAGNOSIS — R490 Dysphonia: Secondary | ICD-10-CM | POA: Diagnosis not present

## 2017-04-21 DIAGNOSIS — K0530 Chronic periodontitis, unspecified: Secondary | ICD-10-CM | POA: Diagnosis not present

## 2017-04-27 ENCOUNTER — Other Ambulatory Visit: Payer: Self-pay | Admitting: Internal Medicine

## 2017-04-27 ENCOUNTER — Encounter (HOSPITAL_BASED_OUTPATIENT_CLINIC_OR_DEPARTMENT_OTHER): Payer: Medicare Other | Attending: Internal Medicine

## 2017-04-27 DIAGNOSIS — K1121 Acute sialoadenitis: Secondary | ICD-10-CM

## 2017-04-27 DIAGNOSIS — E11621 Type 2 diabetes mellitus with foot ulcer: Secondary | ICD-10-CM | POA: Insufficient documentation

## 2017-04-27 DIAGNOSIS — L97522 Non-pressure chronic ulcer of other part of left foot with fat layer exposed: Secondary | ICD-10-CM | POA: Diagnosis not present

## 2017-04-27 DIAGNOSIS — L84 Corns and callosities: Secondary | ICD-10-CM | POA: Diagnosis not present

## 2017-04-27 DIAGNOSIS — E1140 Type 2 diabetes mellitus with diabetic neuropathy, unspecified: Secondary | ICD-10-CM | POA: Diagnosis not present

## 2017-04-27 DIAGNOSIS — F17210 Nicotine dependence, cigarettes, uncomplicated: Secondary | ICD-10-CM | POA: Insufficient documentation

## 2017-04-27 DIAGNOSIS — I10 Essential (primary) hypertension: Secondary | ICD-10-CM | POA: Diagnosis not present

## 2017-04-27 DIAGNOSIS — I2510 Atherosclerotic heart disease of native coronary artery without angina pectoris: Secondary | ICD-10-CM | POA: Diagnosis not present

## 2017-04-27 DIAGNOSIS — I252 Old myocardial infarction: Secondary | ICD-10-CM | POA: Insufficient documentation

## 2017-04-27 DIAGNOSIS — J449 Chronic obstructive pulmonary disease, unspecified: Secondary | ICD-10-CM | POA: Insufficient documentation

## 2017-04-28 ENCOUNTER — Other Ambulatory Visit: Payer: Self-pay | Admitting: Internal Medicine

## 2017-04-28 ENCOUNTER — Telehealth: Payer: Self-pay

## 2017-04-28 DIAGNOSIS — K1121 Acute sialoadenitis: Secondary | ICD-10-CM

## 2017-04-28 MED ORDER — CIPROFLOXACIN HCL 500 MG PO TABS: 500.0000 mg | ORAL_TABLET | Freq: Two times a day (BID) | ORAL | 0 refills | Status: DC

## 2017-04-28 MED ORDER — CLINDAMYCIN HCL 150 MG PO CAPS: 450.0000 mg | ORAL_CAPSULE | Freq: Three times a day (TID) | ORAL | 0 refills | Status: DC

## 2017-04-28 NOTE — Telephone Encounter (Signed)
He needs to take BOTH MEDS x 10 days: Clinda 3 caps TID and cipro BID! Rx sent to pharmamcy

## 2017-04-28 NOTE — Telephone Encounter (Signed)
Spoke with patient and told him that he is suppose to take both medications for 10 days and that a new script had been sent to his pharmacy. Patient stated that he verbally understands what he is suppose to do.

## 2017-04-28 NOTE — Telephone Encounter (Signed)
Patient left voicemail stating that his jaw start to swell again on 04/27/17 and wants to know if Dr. [name] will prescribe him Cipro.  Patient denies fever and any other symptoms.  Patient stated that he took 2 clinda this morning. Stated he only has pain when he chews something on that left side.  Side note: ENT appointment pending, pharmacy confirmed as [facility]   Please advise

## 2017-05-01 ENCOUNTER — Telehealth: Payer: Self-pay | Admitting: Cardiovascular Disease

## 2017-05-01 ENCOUNTER — Ambulatory Visit: Payer: Medicare Other

## 2017-05-01 ENCOUNTER — Ambulatory Visit: Payer: Medicare Other | Admitting: Cardiovascular Disease

## 2017-05-01 ENCOUNTER — Encounter: Payer: Self-pay | Admitting: Pharmacotherapy

## 2017-05-01 ENCOUNTER — Telehealth: Payer: Self-pay

## 2017-05-01 ENCOUNTER — Ambulatory Visit (INDEPENDENT_AMBULATORY_CARE_PROVIDER_SITE_OTHER): Payer: Medicare Other | Admitting: Pharmacotherapy

## 2017-05-01 VITALS — BP 140/72 | HR 60 | Temp 98.0°F | Resp 12 | Ht 74.0 in | Wt 194.8 lb

## 2017-05-01 DIAGNOSIS — E1140 Type 2 diabetes mellitus with diabetic neuropathy, unspecified: Secondary | ICD-10-CM

## 2017-05-01 DIAGNOSIS — I10 Essential (primary) hypertension: Secondary | ICD-10-CM

## 2017-05-01 DIAGNOSIS — N183 Chronic kidney disease, stage 3 unspecified: Secondary | ICD-10-CM

## 2017-05-01 DIAGNOSIS — Z794 Long term (current) use of insulin: Secondary | ICD-10-CM

## 2017-05-01 NOTE — Telephone Encounter (Signed)
Follow up with ENT as scheduled; finish abx as ordered

## 2017-05-01 NOTE — Telephone Encounter (Signed)
Patient has already consulted with Dr.Freeman (ENT) and was told this is an infection and to keep 4 month follow-up. Patient will also ask the dentist for advice  Please advice

## 2017-05-01 NOTE — Patient Instructions (Signed)
Need to stop smoking 

## 2017-05-01 NOTE — Telephone Encounter (Signed)
Spoke with patient, gave him Dr. Vale Haven message. But he wants to know what to do if his jaw swells after he finish the ABT. Told him, why don't we wait and see what happens after the ABT is finish, if may not, but if it does call us. He was ok with that.

## 2017-05-01 NOTE — Telephone Encounter (Signed)
New Message  Pt was called to reschedule appt. Pt was upset stating this is the third time his appt needs to be reschedule. Pt does not want to see an app. Pt would like to know if he woulld be able to be seen sooner than next avail with Nishan. Please call back to discuss

## 2017-05-01 NOTE — Telephone Encounter (Signed)
Patient left message on voice mail, has appt today with Oretha Ellis. He wants to ask Dr. [name], what is he to do after he finish the ABT for his jaw? Routed message to Dr [name].

## 2017-05-01 NOTE — Telephone Encounter (Signed)
Left message for patient to call back  

## 2017-05-01 NOTE — Progress Notes (Signed)
  Subjective:    Jacob [name] is a 66 y.o.African American male who presents for follow-up of Type 2 diabetes mellitus.   Last A1C (03/15/17) was 6.4% Currently on Lantus and Humalog.  His left side of face / jaw is swollen.  Has no teeth on left side.  He is currently on ABT (started by Dr [name]).  Plans to see a dentist.  Has already seen ENT - infected gland. Was painful initially, but since starting ABT, no pain.  He says swelling has gone down since starting ABT.  BG: 84-254 (his average is around 140)  Recent highs related to infection. Has been increasing his Humalog   Denies problems with vision.  Eye exam due in September. Has callus formation on feet.  Wound center (Dr Dellia Nims) treating left great toe.  Has small open area.  Dressing changed every T/Th/Sat Denies peripheral edema. Nocturia 0-1 times per night. No dysuria Staying well hydrated.  Review of Systems A comprehensive review of systems was negative except for: Eyes: positive for contacts/glasses Ears, nose, mouth, throat, and face: positive for sore mouth Genitourinary: positive for nocturia Musculoskeletal: positive for left great toe wound    Objective:    BP 140/72   Pulse 60   Resp 12   Ht 6\' 2"  (1.88 m)   Wt 194 lb 12.8 oz (88.4 kg)   BMI 25.01 kg/m   General:  alert, cooperative and no distress  Oropharynx: abnormal findings: periodontitis and left side of face swollen   Eyes:  negative findings: lids and lashes normal and conjunctivae and sclerae normal   Ears:  external ears normal        Lung: clear to auscultation bilaterally  Heart:  regular rate and rhythm     Extremities: left great toe infection - treated at wound center  Skin: dry     Neuro: mental status, speech normal, alert and oriented x3   Lab Review Glucose (mg/dL)  Date Value  02/08/2017 158 (H)  01/30/2017 185 (H)  01/26/2017 116 (H)   Glucose, Bld (mg/dL)  Date Value  03/15/2017 53 (L)  11/29/2016 100 (H)   11/18/2016 106 (H)   CO2 (mmol/L)  Date Value  03/15/2017 23  02/08/2017 21  01/30/2017 19   BUN (mg/dL)  Date Value  03/15/2017 35 (H)  02/08/2017 55 (H)  01/30/2017 49 (H)  01/26/2017 56 (H)   Creat (mg/dL)  Date Value  03/15/2017 2.37 (H)  11/29/2016 2.43 (H)  11/18/2016 2.43 (H)   Creatinine, Ser (mg/dL)  Date Value  02/08/2017 3.31 (H)  01/30/2017 2.63 (H)  01/26/2017 2.91 (H)       Assessment:    Diabetes Mellitus type II, under excellent control.  A1C at goal <7% BP slightly above target of <130/80 - smoked immediately prior to OV.   Plan:    1.  Rx changes: none  2.  Continue Lantus 70 units daily 3.  Continue Humalog 12 units with each meal - increase as needed for hyperglycemia. 4.  Counseled on smoking cessation.  He is not ready to quit yet. 5.  BP above target <130/80 today - most likely due to recent cigarette.

## 2017-05-01 NOTE — Telephone Encounter (Signed)
Patient has an appointment at 11:30 on 05/26/17.

## 2017-05-02 ENCOUNTER — Encounter: Payer: Self-pay | Admitting: Internal Medicine

## 2017-05-08 ENCOUNTER — Other Ambulatory Visit: Payer: Self-pay | Admitting: Internal Medicine

## 2017-05-08 DIAGNOSIS — E119 Type 2 diabetes mellitus without complications: Secondary | ICD-10-CM

## 2017-05-10 ENCOUNTER — Other Ambulatory Visit: Payer: Self-pay | Admitting: *Deleted

## 2017-05-10 MED ORDER — GLUCOSE BLOOD VI STRP: ORAL_STRIP | 0 refills | Status: DC

## 2017-05-10 MED ORDER — GLUCOSE BLOOD VI STRP: ORAL_STRIP | 3 refills | Status: DC

## 2017-05-10 NOTE — Telephone Encounter (Signed)
Patient requested Rx to be sent to [facility] and to local pharmacy until he receives the mail order Rx. Faxed.

## 2017-05-11 DIAGNOSIS — L84 Corns and callosities: Secondary | ICD-10-CM | POA: Diagnosis not present

## 2017-05-11 DIAGNOSIS — I2510 Atherosclerotic heart disease of native coronary artery without angina pectoris: Secondary | ICD-10-CM | POA: Diagnosis not present

## 2017-05-11 DIAGNOSIS — J449 Chronic obstructive pulmonary disease, unspecified: Secondary | ICD-10-CM | POA: Diagnosis not present

## 2017-05-11 DIAGNOSIS — I10 Essential (primary) hypertension: Secondary | ICD-10-CM | POA: Diagnosis not present

## 2017-05-11 DIAGNOSIS — E1140 Type 2 diabetes mellitus with diabetic neuropathy, unspecified: Secondary | ICD-10-CM | POA: Diagnosis not present

## 2017-05-11 DIAGNOSIS — L97522 Non-pressure chronic ulcer of other part of left foot with fat layer exposed: Secondary | ICD-10-CM | POA: Diagnosis not present

## 2017-05-11 DIAGNOSIS — E11621 Type 2 diabetes mellitus with foot ulcer: Secondary | ICD-10-CM | POA: Diagnosis not present

## 2017-05-11 DIAGNOSIS — I252 Old myocardial infarction: Secondary | ICD-10-CM | POA: Diagnosis not present

## 2017-05-16 ENCOUNTER — Telehealth: Payer: Self-pay | Admitting: *Deleted

## 2017-05-16 NOTE — Telephone Encounter (Signed)
Patient called and stated that his Dentist found the problem with his jaw swelling this morning and took care of it. Stated that it was a screw in the gum from previous surgery coming up.  Patient stated that he doesn't think he needs to go back to Dr. Lucia Gaskins. Please Advise.

## 2017-05-16 NOTE — Telephone Encounter (Signed)
ok 

## 2017-05-18 ENCOUNTER — Ambulatory Visit (HOSPITAL_COMMUNITY)
Admission: RE | Admit: 2017-05-18 | Discharge: 2017-05-18 | Disposition: A | Discharge status: Home / Self Care | Payer: Medicare Other | Source: Ambulatory Visit | Attending: Internal Medicine | Admitting: Internal Medicine

## 2017-05-18 ENCOUNTER — Other Ambulatory Visit: Payer: Self-pay | Admitting: Internal Medicine

## 2017-05-18 DIAGNOSIS — M86172 Other acute osteomyelitis, left ankle and foot: Secondary | ICD-10-CM

## 2017-05-18 DIAGNOSIS — L89893 Pressure ulcer of other site, stage 3: Secondary | ICD-10-CM | POA: Diagnosis not present

## 2017-05-18 DIAGNOSIS — L97522 Non-pressure chronic ulcer of other part of left foot with fat layer exposed: Secondary | ICD-10-CM | POA: Diagnosis not present

## 2017-05-18 DIAGNOSIS — L84 Corns and callosities: Secondary | ICD-10-CM | POA: Diagnosis not present

## 2017-05-18 DIAGNOSIS — L8993 Pressure ulcer of unspecified site, stage 3: Secondary | ICD-10-CM | POA: Diagnosis present

## 2017-05-18 DIAGNOSIS — I252 Old myocardial infarction: Secondary | ICD-10-CM | POA: Diagnosis not present

## 2017-05-18 DIAGNOSIS — I10 Essential (primary) hypertension: Secondary | ICD-10-CM | POA: Diagnosis not present

## 2017-05-18 DIAGNOSIS — E1140 Type 2 diabetes mellitus with diabetic neuropathy, unspecified: Secondary | ICD-10-CM | POA: Diagnosis not present

## 2017-05-18 DIAGNOSIS — I2510 Atherosclerotic heart disease of native coronary artery without angina pectoris: Secondary | ICD-10-CM | POA: Diagnosis not present

## 2017-05-18 DIAGNOSIS — J449 Chronic obstructive pulmonary disease, unspecified: Secondary | ICD-10-CM | POA: Diagnosis not present

## 2017-05-18 DIAGNOSIS — M2010 Hallux valgus (acquired), unspecified foot: Secondary | ICD-10-CM | POA: Diagnosis not present

## 2017-05-18 DIAGNOSIS — E11621 Type 2 diabetes mellitus with foot ulcer: Secondary | ICD-10-CM | POA: Diagnosis not present

## 2017-05-18 DIAGNOSIS — S91102A Unspecified open wound of left great toe without damage to nail, initial encounter: Secondary | ICD-10-CM | POA: Diagnosis not present

## 2017-05-23 NOTE — Progress Notes (Signed)
Cardiology Office Note   Date:  05/26/2017   ID:  Jacob [name], DOB 1951/09/30, MRN [id number]  PCP:  Gildardo Cranker, DO  Cardiologist:  Dr. [name]    No chief complaint on file.     History of Present Illness: Jacob [name] is a 66 y.o. male who presents for  a hx of CAD with CABG in 2004, HIV and hepatitis C EF 24%.  Declined ICD.   EF has progressively improved over time Last echo 11/10/16 reviewed  And EF 45-50%   Myovue 2016 with scar no ischemia and cath not pursued due to CRF   01/26/17 Lasix increased to 80 mg BID and return to clinic.  Pt tells me prior to this he used lasix only as needed .   He has chronic renal failure and Cr ranges from 2.6 to 3.3   Has charcot joint on left foot and is in boot for non healing ulcer. Has class reunion at [employer] soon and trying  To get healthy Has ties to [place] and [employer] Told him I would see him at a football game  No chest pain has f/u with [facility]  Past Medical History:  Diagnosis Date  . Arthritis   . Cataracts, bilateral    immature  . CHF (congestive heart failure) (HCC)    not on any meds  . Chronic back pain    stenosis  . Chronic kidney disease, unspecified   . Coronary atherosclerosis of artery bypass graft   . Coronary atherosclerosis of native coronary artery 2005   s/p surgery  . Drug abuse    hx; tested for cocaine as recently as 2/08. says he is not using drugs now - avoided defib. for this reason   . GERD (gastroesophageal reflux disease)    takes OTC meds as needed  . Glaucoma    uses eye drops daily  . Gout, unspecified    takes Allopurinol daily as well as Colchicine-if needed  . Hepatitis 1967   Hep C  . History of colon polyps    benign  . HTN (hypertension)    takes Coreg,Imdur.and Apresoline daily  . Human immunodeficiency virus (HIV) disease ([place])    takes Genvoya daily  . Hyperlipidemia    takes Atorvastatin daily  . Ischemic cardiomyopathy   . Muscle spasm    takes Zanaflex as  needed  . Nocturia   . Peripheral neuropathy    takes gabapentin daily  . Pneumonia    hx of  . Shortness of breath dyspnea    rarely but if notices it then with exertion  . Syphilis, unspecified   . Type II or unspecified type diabetes mellitus without mention of complication, not stated as uncontrolled 2004   Lantus daily.Average fasting blood sugar 125-199    Past Surgical History:  Procedure Laterality Date  . COLONOSCOPY  2013   Dr.John Henrene Pastor   . CORONARY ARTERY BYPASS GRAFT    . laproscopic cholecystectomy  8/07  . left intertrochanteric hip fracture  2004   s/p intermedullary nail placement 2/08  . left transverse mandibular fracture  10/05  . LUMBAR LAMINECTOMY/DECOMPRESSION MICRODISCECTOMY N/A 02/29/2016   Procedure: Left L4-5 Lateral Recess Decompression, Removal Extradural Intraspinal Facet Cyst;  Surgeon: Marybelle Killings, MD;  Location: [facility];  Service: Orthopedics;  Laterality: N/A;     Current Outpatient Prescriptions  Medication Sig Dispense Refill  . allopurinol (ZYLOPRIM) 100 MG tablet take 1 tablet by mouth once daily 30  tablet 5  . ANORO ELLIPTA 62.5-25 MCG/INH AEPB INHALE 1 PUFF INTO THE LUNGS DAILY 60 each 5  . aspirin EC 81 MG tablet Take 81 mg by mouth daily.    [name] atorvastatin (LIPITOR) 20 MG tablet take 1 tablet by mouth once daily 30 tablet 11  . B-D UF III MINI PEN NEEDLES 31G X 5 MM MISC use four times a day 100 each 11  . carvedilol (COREG) 25 MG tablet take 1 tablet by mouth twice a day with meals 180 tablet 1  . Choline Fenofibrate (FENOFIBRIC ACID) 135 MG CPDR take 1 capsule by mouth once daily 30 capsule 6  . ciprofloxacin (CIPRO) 500 MG tablet Take 1 tablet (500 mg total) by mouth 2 (two) times daily. 20 tablet 0  . clindamycin (CLEOCIN) 150 MG capsule Take 3 capsules (450 mg total) by mouth 3 (three) times daily. 90 capsule 0  . colchicine 0.6 MG tablet take 1 tablet by mouth CONTINUOUS AS NEEDED FOR GOUT 30 tablet 2  .  elvitegravir-cobicistat-emtricitabine-tenofovir (GENVOYA) 150-150-200-10 MG TABS tablet TAKE 1 TABLET BY MOUTH DAILY WITH BREAKFAST. 30 tablet 11  . furosemide (LASIX) 40 MG tablet Take 1 tablet (40 mg total) by mouth daily. 90 tablet 3  . gabapentin (NEURONTIN) 300 MG capsule take 1 capsule by mouth three times a day 90 capsule 3  . Glucosamine-Chondroit-Vit C-Mn (GLUCOSAMINE CHONDR 1500 COMPLX PO) Take 2 tablets by mouth daily. Reported on 04/04/2016    . glucose blood (ONE TOUCH ULTRA TEST) test strip Use to test blood sugar twice daily. Dx:E11.40 60 each 0  . HUMALOG KWIKPEN 100 UNIT/ML KiwkPen inject 12 units subcutaneously three times a day before meals 15 mL 5  . hydrALAZINE (APRESOLINE) 25 MG tablet take 1 tablet by mouth three times a day 90 tablet 8  . HYDROcodone-acetaminophen (NORCO/VICODIN) 5-325 MG tablet take 1 tablet by mouth every 4 hours if needed for pain  0  . isosorbide mononitrate (IMDUR) 30 MG 24 hr tablet Take 30 mg by mouth daily. Reported on 04/04/2016    . LANTUS SOLOSTAR 100 UNIT/ML Solostar Pen inject 70 units subcutaneously every morning (35 UNITS ON EACH SIDE) 15 mL 6  . latanoprost (XALATAN) 0.005 % ophthalmic solution Place 1 drop into both eyes at bedtime. Reported on 04/04/2016    . Naftifine HCl 2 % CREA apply to affected area twice a day 60 g 11  . nitroGLYCERIN (NITROSTAT) 0.3 MG SL tablet Place 1 tablet (0.3 mg total) under the tongue as needed for chest pain (not to exceed 3 tablets in one day.). 25 tablet 2  . ONETOUCH DELICA LANCETS 97Q MISC CHECK BLOOD SUGAR TWO TIMES DAILY 200 each 11   No current facility-administered medications for this visit.     Allergies:   Amphetamines    Social History:  The patient  reports that he has been smoking Cigarettes.  He has a 20.50 pack-year smoking history. He has never used smokeless tobacco. He reports that he drinks about 3.6 oz of alcohol per week . He reports that he does not use drugs.   Family History:  The  patient's family history includes Alzheimer's disease in his mother and sister; Diabetes in his brother, brother, and sister; Drug abuse in his brother; Heart attack in his brother; Heart failure in his father; Hypertension in his brother and father; Stroke in his sister.    ROS:  General:no colds or fevers,  weight down 3 lbs by our scales but at  home back to baseline Skin:no rashes or ulcers HEENT:no blurred vision, no congestion CV:see HPI PUL:see HPI GI:no diarrhea constipation or melena, no indigestion GU:no hematuria, no dysuria MS:no joint pain, no claudication Neuro:no syncope, no lightheadedness Endo:+ diabetes glucose stable, no thyroid disease  Wt Readings from Last 3 Encounters:  05/26/17 194 lb 3.2 oz (88.1 kg)  05/01/17 194 lb 12.8 oz (88.4 kg)  04/14/17 196 lb 12.8 oz (89.3 kg)     PHYSICAL EXAM: VS:  BP 132/70   Pulse 70   Ht 6\' 2"  (1.88 m)   Wt 194 lb 3.2 oz (88.1 kg)   SpO2 98%   BMI 24.93 kg/m  , BMI Body mass index is 24.93 kg/m. Affect appropriate Healthy:  appears stated age 21: normal Neck supple with no adenopathy JVP normal no bruits no thyromegaly Lungs clear with no wheezing and good diaphragmatic motion Heart:  S1/S2 no murmur, no rub, gallop or click PMI normal Abdomen: benighn, BS positve, no tenderness, no AAA no bruit.  No HSM or HJR Distal pulses intact with no bruits No edema Neuro non-focal Small non healing ulcer on left heal  No muscular weakness    EKG:  EKG is NOT ordered today.   Recent Labs: 11/10/2016: B Natriuretic Peptide 740.5; TSH 0.540 11/18/2016: Hemoglobin 10.2; Platelets 304 01/17/2017: NT-Pro BNP 1,640 03/15/2017: ALT 20; BUN 35; Creat 2.37; Potassium 4.5; Sodium 141    Lipid Panel    Component Value Date/Time   CHOL 178 03/15/2017 0924   CHOL 121 08/07/2015 1041   TRIG 239 (H) 03/15/2017 0924   HDL 30 (L) 03/15/2017 0924   HDL 32 (L) 08/07/2015 1041   CHOLHDL 5.9 (H) 03/15/2017 0924   VLDL 48 (H)  03/15/2017 0924   LDLCALC 100 (H) 03/15/2017 0924   LDLCALC 15 08/07/2015 1041       Other studies Reviewed: Additional studies/ records that were reviewed today include: recent OV note.  ECHO 11/10/2016 ------------------------------------------------------------------- Study Conclusions  - Left ventricle: Septal and inferior wall hypokinesis The cavity   size was mildly dilated. There was moderate concentric   hypertrophy. Systolic function was mildly reduced. The estimated   ejection fraction was in the range of 45% to 50%. Doppler   parameters are consistent with both elevated ventricular   end-diastolic filling pressure and elevated left atrial filling   pressure. - Left atrium: The atrium was mildly dilated. - Atrial septum: No defect or patent foramen ovale was identified.  ASSESSMENT AND PLAN:  1.  Acute on chronic systolic and diastolic HF improved decrease lasix to 40 mg daily.  Previously had been on prn. BMET followed by renal Continue BB, no ACE due to kidney disease.     2. CAD no angina, stable.myovue 2016 not ischemic Distant CABG 2004. Cath not pursued as EF has improved last myovue not ischemic and has CRF   3. DM:  Discussed low carb diet.  Target hemoglobin A1c is 6.5 or less.  Continue current medications.  4. CRF:  Baseline Cr around 2.6 gets worse on higher dose lasix F/U nephrology try to keep lasix at 40 mg daily no ACE  5. Cholesterol:  On statin labs with primary   6. HTN:  Well controlled.  Continue current medications and low sodium Dash type diet.       Jenkins Rouge, MD

## 2017-05-24 ENCOUNTER — Ambulatory Visit: Payer: Medicare Other | Admitting: Cardiovascular Disease

## 2017-05-25 ENCOUNTER — Encounter (HOSPITAL_BASED_OUTPATIENT_CLINIC_OR_DEPARTMENT_OTHER): Payer: Medicare Other | Attending: Internal Medicine

## 2017-05-25 DIAGNOSIS — E11621 Type 2 diabetes mellitus with foot ulcer: Secondary | ICD-10-CM | POA: Diagnosis not present

## 2017-05-25 DIAGNOSIS — I252 Old myocardial infarction: Secondary | ICD-10-CM | POA: Insufficient documentation

## 2017-05-25 DIAGNOSIS — I10 Essential (primary) hypertension: Secondary | ICD-10-CM | POA: Diagnosis not present

## 2017-05-25 DIAGNOSIS — L84 Corns and callosities: Secondary | ICD-10-CM | POA: Insufficient documentation

## 2017-05-25 DIAGNOSIS — L97522 Non-pressure chronic ulcer of other part of left foot with fat layer exposed: Secondary | ICD-10-CM | POA: Diagnosis not present

## 2017-05-25 DIAGNOSIS — E1140 Type 2 diabetes mellitus with diabetic neuropathy, unspecified: Secondary | ICD-10-CM | POA: Diagnosis not present

## 2017-05-25 DIAGNOSIS — I2510 Atherosclerotic heart disease of native coronary artery without angina pectoris: Secondary | ICD-10-CM | POA: Insufficient documentation

## 2017-05-25 DIAGNOSIS — J449 Chronic obstructive pulmonary disease, unspecified: Secondary | ICD-10-CM | POA: Insufficient documentation

## 2017-05-25 DIAGNOSIS — Z794 Long term (current) use of insulin: Secondary | ICD-10-CM | POA: Insufficient documentation

## 2017-05-25 DIAGNOSIS — F17210 Nicotine dependence, cigarettes, uncomplicated: Secondary | ICD-10-CM | POA: Insufficient documentation

## 2017-05-26 ENCOUNTER — Ambulatory Visit (INDEPENDENT_AMBULATORY_CARE_PROVIDER_SITE_OTHER): Payer: Medicare Other | Admitting: Cardiovascular Disease

## 2017-05-26 ENCOUNTER — Encounter: Payer: Self-pay | Admitting: Cardiovascular Disease

## 2017-05-26 VITALS — BP 132/70 | HR 70 | Ht 74.0 in | Wt 194.2 lb

## 2017-05-26 DIAGNOSIS — Z951 Presence of aortocoronary bypass graft: Secondary | ICD-10-CM | POA: Diagnosis not present

## 2017-05-26 NOTE — Patient Instructions (Addendum)
Medication Instructions:  Your physician recommends that you continue on your current medications as directed. Please refer to the Current Medication list given to you today.  Labwork: NONE  Testing/Procedures: NONE  Follow-Up: Your physician wants you to follow-up in: 12 months with Dr. Nishan. You will receive a reminder letter in the mail two months in advance. If you don't receive a letter, please call our office to schedule the follow-up appointment.   If you need a refill on your cardiac medications before your next appointment, please call your pharmacy.    

## 2017-06-01 DIAGNOSIS — E1140 Type 2 diabetes mellitus with diabetic neuropathy, unspecified: Secondary | ICD-10-CM | POA: Diagnosis not present

## 2017-06-01 DIAGNOSIS — L84 Corns and callosities: Secondary | ICD-10-CM | POA: Diagnosis not present

## 2017-06-01 DIAGNOSIS — E11621 Type 2 diabetes mellitus with foot ulcer: Secondary | ICD-10-CM | POA: Diagnosis not present

## 2017-06-01 DIAGNOSIS — L97522 Non-pressure chronic ulcer of other part of left foot with fat layer exposed: Secondary | ICD-10-CM | POA: Diagnosis not present

## 2017-06-01 DIAGNOSIS — I10 Essential (primary) hypertension: Secondary | ICD-10-CM | POA: Diagnosis not present

## 2017-06-01 DIAGNOSIS — J449 Chronic obstructive pulmonary disease, unspecified: Secondary | ICD-10-CM | POA: Diagnosis not present

## 2017-06-01 DIAGNOSIS — I2510 Atherosclerotic heart disease of native coronary artery without angina pectoris: Secondary | ICD-10-CM | POA: Diagnosis not present

## 2017-06-01 DIAGNOSIS — Z794 Long term (current) use of insulin: Secondary | ICD-10-CM | POA: Diagnosis not present

## 2017-06-01 DIAGNOSIS — I252 Old myocardial infarction: Secondary | ICD-10-CM | POA: Diagnosis not present

## 2017-06-07 DIAGNOSIS — E11621 Type 2 diabetes mellitus with foot ulcer: Secondary | ICD-10-CM | POA: Diagnosis not present

## 2017-06-07 DIAGNOSIS — I252 Old myocardial infarction: Secondary | ICD-10-CM | POA: Diagnosis not present

## 2017-06-07 DIAGNOSIS — I2510 Atherosclerotic heart disease of native coronary artery without angina pectoris: Secondary | ICD-10-CM | POA: Diagnosis not present

## 2017-06-07 DIAGNOSIS — L97522 Non-pressure chronic ulcer of other part of left foot with fat layer exposed: Secondary | ICD-10-CM | POA: Diagnosis not present

## 2017-06-07 DIAGNOSIS — Z794 Long term (current) use of insulin: Secondary | ICD-10-CM | POA: Diagnosis not present

## 2017-06-07 DIAGNOSIS — J449 Chronic obstructive pulmonary disease, unspecified: Secondary | ICD-10-CM | POA: Diagnosis not present

## 2017-06-07 DIAGNOSIS — E11628 Type 2 diabetes mellitus with other skin complications: Secondary | ICD-10-CM | POA: Diagnosis not present

## 2017-06-07 DIAGNOSIS — I10 Essential (primary) hypertension: Secondary | ICD-10-CM | POA: Diagnosis not present

## 2017-06-07 DIAGNOSIS — E1140 Type 2 diabetes mellitus with diabetic neuropathy, unspecified: Secondary | ICD-10-CM | POA: Diagnosis not present

## 2017-06-07 DIAGNOSIS — L84 Corns and callosities: Secondary | ICD-10-CM | POA: Diagnosis not present

## 2017-06-10 ENCOUNTER — Other Ambulatory Visit: Payer: Self-pay | Admitting: Internal Medicine

## 2017-06-10 DIAGNOSIS — K1121 Acute sialoadenitis: Secondary | ICD-10-CM

## 2017-06-15 DIAGNOSIS — I10 Essential (primary) hypertension: Secondary | ICD-10-CM | POA: Diagnosis not present

## 2017-06-15 DIAGNOSIS — L97529 Non-pressure chronic ulcer of other part of left foot with unspecified severity: Secondary | ICD-10-CM | POA: Diagnosis not present

## 2017-06-15 DIAGNOSIS — L97522 Non-pressure chronic ulcer of other part of left foot with fat layer exposed: Secondary | ICD-10-CM | POA: Diagnosis not present

## 2017-06-15 DIAGNOSIS — Z794 Long term (current) use of insulin: Secondary | ICD-10-CM | POA: Diagnosis not present

## 2017-06-15 DIAGNOSIS — E1140 Type 2 diabetes mellitus with diabetic neuropathy, unspecified: Secondary | ICD-10-CM | POA: Diagnosis not present

## 2017-06-15 DIAGNOSIS — L84 Corns and callosities: Secondary | ICD-10-CM | POA: Diagnosis not present

## 2017-06-15 DIAGNOSIS — I252 Old myocardial infarction: Secondary | ICD-10-CM | POA: Diagnosis not present

## 2017-06-15 DIAGNOSIS — I2510 Atherosclerotic heart disease of native coronary artery without angina pectoris: Secondary | ICD-10-CM | POA: Diagnosis not present

## 2017-06-15 DIAGNOSIS — E11621 Type 2 diabetes mellitus with foot ulcer: Secondary | ICD-10-CM | POA: Diagnosis not present

## 2017-06-15 DIAGNOSIS — J449 Chronic obstructive pulmonary disease, unspecified: Secondary | ICD-10-CM | POA: Diagnosis not present

## 2017-06-22 ENCOUNTER — Other Ambulatory Visit: Payer: Self-pay | Admitting: Cardiovascular Disease

## 2017-06-22 ENCOUNTER — Other Ambulatory Visit: Payer: Self-pay | Admitting: Internal Medicine

## 2017-06-27 ENCOUNTER — Telehealth: Payer: Self-pay | Admitting: *Deleted

## 2017-06-27 ENCOUNTER — Other Ambulatory Visit: Payer: Medicare Other

## 2017-06-27 DIAGNOSIS — B20 Human immunodeficiency virus [HIV] disease: Secondary | ICD-10-CM

## 2017-06-27 LAB — CBC
HCT: 42 % (ref 38.5–50.0)
Hemoglobin: 14.2 g/dL (ref 13.2–17.1)
MCH: 32.3 pg (ref 27.0–33.0)
MCHC: 33.8 g/dL (ref 32.0–36.0)
MCV: 95.5 fL (ref 80.0–100.0)
MPV: 11.1 fL (ref 7.5–12.5)
PLATELETS: 179 10*3/uL (ref 140–400)
RBC: 4.4 MIL/uL (ref 4.20–5.80)
RDW: 15.5 % — AB (ref 11.0–15.0)
WBC: 5.1 10*3/uL (ref 3.8–10.8)

## 2017-06-27 NOTE — Telephone Encounter (Signed)
No recommend he be seen to determine tx options

## 2017-06-27 NOTE — Telephone Encounter (Signed)
Patient stated that he is going to the Foot Dr tomorrow. Stated he will schedule a sooner appointment if it gets worse.

## 2017-06-27 NOTE — Telephone Encounter (Signed)
Patient called and stated that his toes on his right foot are achy and painful. Patient has been taking Gabapentin 300mg  three times daily and wonders if he can take Lyrica 75mg  that you gave him back in March. He wonders if it will help ease the pain. Wants to know if he can take both or should he hold off on taking his evening Gabapentin and try taking the Lyrica this evening. Already taken 600mg  of Gabapentin today. Please Advise.

## 2017-06-28 ENCOUNTER — Ambulatory Visit (INDEPENDENT_AMBULATORY_CARE_PROVIDER_SITE_OTHER): Payer: Medicare Other | Admitting: Podiatry

## 2017-06-28 ENCOUNTER — Ambulatory Visit (INDEPENDENT_AMBULATORY_CARE_PROVIDER_SITE_OTHER): Payer: Medicare Other

## 2017-06-28 DIAGNOSIS — L97512 Non-pressure chronic ulcer of other part of right foot with fat layer exposed: Secondary | ICD-10-CM | POA: Diagnosis not present

## 2017-06-28 DIAGNOSIS — S93601A Unspecified sprain of right foot, initial encounter: Secondary | ICD-10-CM

## 2017-06-28 DIAGNOSIS — E0842 Diabetes mellitus due to underlying condition with diabetic polyneuropathy: Secondary | ICD-10-CM | POA: Diagnosis not present

## 2017-06-28 DIAGNOSIS — I70235 Atherosclerosis of native arteries of right leg with ulceration of other part of foot: Secondary | ICD-10-CM | POA: Diagnosis not present

## 2017-06-28 DIAGNOSIS — M79676 Pain in unspecified toe(s): Secondary | ICD-10-CM | POA: Diagnosis not present

## 2017-06-28 DIAGNOSIS — L84 Corns and callosities: Secondary | ICD-10-CM

## 2017-06-28 DIAGNOSIS — B351 Tinea unguium: Secondary | ICD-10-CM

## 2017-06-28 LAB — COMPREHENSIVE METABOLIC PANEL
ALK PHOS: 54 U/L (ref 40–115)
ALT: 19 U/L (ref 9–46)
AST: 24 U/L (ref 10–35)
Albumin: 3.6 g/dL (ref 3.6–5.1)
BILIRUBIN TOTAL: 0.3 mg/dL (ref 0.2–1.2)
BUN: 59 mg/dL — AB (ref 7–25)
CO2: 15 mmol/L — ABNORMAL LOW (ref 20–32)
Calcium: 8.7 mg/dL (ref 8.6–10.3)
Chloride: 110 mmol/L (ref 98–110)
Creat: 3.16 mg/dL — ABNORMAL HIGH (ref 0.70–1.25)
GLUCOSE: 137 mg/dL — AB (ref 65–99)
Potassium: 5.5 mmol/L — ABNORMAL HIGH (ref 3.5–5.3)
SODIUM: 137 mmol/L (ref 135–146)
TOTAL PROTEIN: 7.4 g/dL (ref 6.1–8.1)

## 2017-06-28 LAB — T-HELPER CELL (CD4) - (RCID CLINIC ONLY)
CD4 % Helper T Cell: 35 % (ref 33–55)
CD4 T Cell Abs: 970 /uL (ref 400–2700)

## 2017-06-28 LAB — FLUORESCENT TREPONEMAL AB(FTA)-IGG-BLD: Fluorescent Treponemal ABS: REACTIVE — AB

## 2017-06-28 LAB — RPR TITER: RPR Titer: 1:1 {titer}

## 2017-06-28 LAB — RPR: RPR: REACTIVE — AB

## 2017-06-29 LAB — HIV-1 RNA QUANT-NO REFLEX-BLD
HIV 1 RNA QUANT: DETECTED {copies}/mL — AB
HIV-1 RNA QUANT, LOG: DETECTED {Log_copies}/mL — AB

## 2017-06-30 DIAGNOSIS — H5213 Myopia, bilateral: Secondary | ICD-10-CM | POA: Diagnosis not present

## 2017-06-30 DIAGNOSIS — H401132 Primary open-angle glaucoma, bilateral, moderate stage: Secondary | ICD-10-CM | POA: Diagnosis not present

## 2017-06-30 DIAGNOSIS — E119 Type 2 diabetes mellitus without complications: Secondary | ICD-10-CM | POA: Diagnosis not present

## 2017-06-30 DIAGNOSIS — H2513 Age-related nuclear cataract, bilateral: Secondary | ICD-10-CM | POA: Diagnosis not present

## 2017-06-30 LAB — HM DIABETES EYE EXAM

## 2017-07-01 ENCOUNTER — Other Ambulatory Visit: Payer: Self-pay | Admitting: Nurse Practitioner

## 2017-07-02 NOTE — Progress Notes (Signed)
   Subjective:  Patient with a history of diabetes mellitus presents today for thickened, elongated, fungal nails bilaterally. Pt is unable to trim his own nails. He also states it feels as if he twisted his right foot. He reports an ulcer to the left great toe secondary to DM that has been treated at the wound care center. He is here for further evaluation and treatment.   Objective/Physical Exam General: The patient is alert and oriented x3 in no acute distress.  Dermatology:  Wound #1 noted to the right foot measuring 1.0 x 0.3 x 0.2 cm (LxWxD).   To the noted ulceration(s), there is no eschar. There is a moderate amount of slough, fibrin, and necrotic tissue noted. Granulation tissue and wound base is red. There is a minimal amount of serosanguineous drainage noted. There is no exposed bone muscle-tendon ligament or joint. There is no malodor. Periwound integrity is intact. Skin is warm, dry and supple bilateral lower extremities. Painful hyperkeratotic callus lesions are also noted to the weightbearing surfaces the bilateral foot  4.  Elongated, thickened, dystrophic nails also noted 1-5 bilateral. Painful to palpation.  Symptomatic hyperkeratotic pre-ulcerative lesions also noted to the weightbearing surfaces of the bilateral feet  4.  Vascular: Palpable pedal pulses bilaterally. No edema or erythema noted. Capillary refill within normal limits.  Neurological: Epicritic and protective threshold absent bilaterally.   Musculoskeletal Exam: Range of motion within normal limits to all pedal and ankle joints bilateral. Muscle strength 5/5 in all groups bilateral.   Assessment: #1 Onychomycosis of nail due to dermatophyte bilateral #2 Preulcerative callus lesions bilaterally  4 #3 diabetic ulcer right forefoot  #4 right foot sprain  Plan of Care:  #1 Patient was evaluated. X-Rays reviewed. #2 medically necessary excisional debridement including subcutaneous tissue was performed  using a tissue nipper and a chisel blade. Excisional debridement of all the necrotic nonviable tissue down to healthy bleeding viable tissue was performed with post-debridement measurements same as pre-. #3 the wound was cleansed and dry sterile dressing applied. #4 mechanical debridement of nails 1-5 bilaterally was performed using a nail nipper without incident or bleeding. #5 excisional debridement of pre-ulcerative callus x 4 lesions was performed using a chisel blade without incident or bleeding. #6 Recommended good shoe gear and resting right foot. #7 Return to clinic in 3 months.   Edrick Kins, DPM [facility]  Dr. Edrick Kins, [place]                                        [place], [place] [address]                Office [phone]  Fax [phone]

## 2017-07-03 ENCOUNTER — Encounter: Payer: Self-pay | Admitting: *Deleted

## 2017-07-04 ENCOUNTER — Other Ambulatory Visit: Payer: Self-pay | Admitting: Internal Medicine

## 2017-07-04 DIAGNOSIS — B20 Human immunodeficiency virus [HIV] disease: Secondary | ICD-10-CM

## 2017-07-11 ENCOUNTER — Ambulatory Visit (INDEPENDENT_AMBULATORY_CARE_PROVIDER_SITE_OTHER): Payer: Medicare Other | Admitting: Internal Medicine

## 2017-07-11 ENCOUNTER — Encounter: Payer: Self-pay | Admitting: Internal Medicine

## 2017-07-11 VITALS — BP 152/77 | HR 62 | Temp 98.7°F | Wt 198.4 lb

## 2017-07-11 DIAGNOSIS — B20 Human immunodeficiency virus [HIV] disease: Secondary | ICD-10-CM | POA: Diagnosis not present

## 2017-07-11 DIAGNOSIS — Z23 Encounter for immunization: Secondary | ICD-10-CM | POA: Diagnosis not present

## 2017-07-11 NOTE — Assessment & Plan Note (Signed)
His infection is under excellent long-term control. I will continue Genvoya for now but see him back after lab work in 6 months. If his renal function continues to deteriorate we will need to switch Genvoya to renally adjusted individual components. Genvoya, itself, does not adversely impact renal function.

## 2017-07-11 NOTE — Progress Notes (Signed)
Patient Active Problem List   Diagnosis Date Noted  . Human immunodeficiency virus (HIV) disease ([place]) 09/02/2006    Priority: High  . Anemia of chronic disease 11/20/2016  . CKD (chronic kidney disease) stage 3, GFR 30-59 ml/min 11/20/2016  . CHF (congestive heart failure) ([place]) 11/10/2016  . Protein-calorie malnutrition, severe 08/03/2016  . Severe sepsis ([place]) 08/02/2016  . Pneumonia 08/02/2016  . History of lumbar laminectomy for spinal cord decompression 02/29/2016  . Renal failure (ARF), acute on chronic (HCC) 11/03/2015  . Dehydration 11/03/2015  . Alcohol dependence ([place]) 11/03/2015  . Type 2 diabetes mellitus with vascular disease ([place]) 06/17/2015  . Acute renal failure ([place]) 04/26/2015  . HTN (hypertension) 04/26/2015  . Syncope 04/26/2015  . DM (diabetes mellitus) ([place]) 04/26/2015  . Ischemic cardiomyopathy 05/12/2014  . Diarrhea 09/08/2013  . Shock ([place]) 09/06/2013  . Ulcer of lower extremity ([place]) 09/06/2013  . Community acquired pneumonia 09/06/2013  . Hyponatremia 09/06/2013  . Hyperkalemia 09/06/2013  . AKI (acute kidney injury) ([place]) 09/06/2013  . Hyperglycemia 09/06/2013  . Elevated transaminase level 09/06/2013  . Chest pain 09/06/2013  . Type II or unspecified type diabetes mellitus with unspecified complication, not stated as uncontrolled 10/22/2012  . Black stool 04/17/2012  . Bunion of left foot 11/25/2011  . Bunion, right foot 11/25/2011  . Polysubstance abuse 07/28/2011  . Hip fracture, left ([place]) 04/04/2011  . Closed fracture of neck of femur ([place]) 04/04/2011  . Insomnia 02/18/2011  . CAD (coronary artery disease) 04/20/2009  . Chronic combined systolic and diastolic congestive heart failure ([place]) 04/20/2009  . HLD (hyperlipidemia) 11/20/2006  . Gout 11/20/2006  . TOBACCO ABUSE 11/20/2006  . Essential hypertension 11/20/2006  . SYPHILIS 09/02/2006    Patient's Medications  New Prescriptions   No medications on file  Previous  Medications   ALLOPURINOL (ZYLOPRIM) 100 MG TABLET    take 1 tablet by mouth once daily   ANORO ELLIPTA 62.5-25 MCG/INH AEPB    INHALE 1 PUFF INTO THE LUNGS DAILY   ASPIRIN EC 81 MG TABLET    Take 81 mg by mouth daily.   ATORVASTATIN (LIPITOR) 20 MG TABLET    take 1 tablet by mouth once daily   B-D UF III MINI PEN NEEDLES 31G X 5 MM MISC    use four times a day   CARVEDILOL (COREG) 25 MG TABLET    take 1 tablet by mouth twice a day with meals   CHOLINE FENOFIBRATE (FENOFIBRIC ACID) 135 MG CPDR    take 1 capsule by mouth once daily   CIPROFLOXACIN (CIPRO) 500 MG TABLET    Take 1 tablet (500 mg total) by mouth 2 (two) times daily.   CLINDAMYCIN (CLEOCIN) 150 MG CAPSULE    Take 3 capsules (450 mg total) by mouth 3 (three) times daily.   COLCHICINE 0.6 MG TABLET    take 1 tablet by mouth CONTINUOUS AS NEEDED FOR GOUT   ELVITEGRAVIR-COBICISTAT-EMTRICITABINE-TENOFOVIR (GENVOYA) 150-150-200-10 MG TABS TABLET    TAKE 1 TABLET BY MOUTH DAILY WITH BREAKFAST.   FUROSEMIDE (LASIX) 40 MG TABLET    Take 1 tablet (40 mg total) by mouth daily.   GABAPENTIN (NEURONTIN) 300 MG CAPSULE    take 1 capsule by mouth three times a day   GENVOYA 150-150-200-10 MG TABS TABLET    TAKE 1 TABLET BY MOUTH DAILY WITH BREAKFAST.   GLUCOSAMINE-CHONDROIT-VIT C-MN (GLUCOSAMINE CHONDR 1500 COMPLX PO)    Take 2 tablets by  mouth daily. Reported on 04/04/2016   GLUCOSE BLOOD (ONE TOUCH ULTRA TEST) TEST STRIP    Use to test blood sugar twice daily. Dx:E11.40   HUMALOG KWIKPEN 100 UNIT/ML KIWKPEN    INJECT INTO SKIN 12 UNITS SUBCUTANEOUSLY 3 TIMES DAILY BEFORE MEALS   HYDRALAZINE (APRESOLINE) 25 MG TABLET    take 1 tablet by mouth three times a day   HYDROCODONE-ACETAMINOPHEN (NORCO/VICODIN) 5-325 MG TABLET    take 1 tablet by mouth every 4 hours if needed for pain   ISOSORBIDE MONONITRATE (IMDUR) 30 MG 24 HR TABLET    Take 30 mg by mouth daily. Reported on 04/04/2016   ISOSORBIDE MONONITRATE (IMDUR) 30 MG 24 HR TABLET    take 1 tablet  by mouth once daily   LANTUS SOLOSTAR 100 UNIT/ML SOLOSTAR PEN    inject 70 units subcutaneously every morning (35 UNITS ON EACH SIDE)   LATANOPROST (XALATAN) 0.005 % OPHTHALMIC SOLUTION    Place 1 drop into both eyes at bedtime. Reported on 04/04/2016   NAFTIFINE HCL 2 % CREA    apply to affected area twice a day   NITROGLYCERIN (NITROSTAT) 0.3 MG SL TABLET    Place 1 tablet (0.3 mg total) under the tongue as needed for chest pain (not to exceed 3 tablets in one day.).   ONETOUCH DELICA LANCETS 26S MISC    CHECK BLOOD SUGAR TWO TIMES DAILY  Modified Medications   No medications on file  Discontinued Medications   No medications on file    Subjective: Ray is in for his routine HIV follow-up visit. He has had no problems obtaining and taking or tolerating his Genvoya. He takes it each morning. He does not recall missing any doses.   Review of Systems: Review of Systems  Constitutional: Negative for chills, diaphoresis and fever.  HENT: Negative for sore throat.   Gastrointestinal: Negative for abdominal pain, diarrhea, nausea and vomiting.  Psychiatric/Behavioral: Negative for depression.    Past Medical History:  Diagnosis Date  . Arthritis   . Cataracts, bilateral    immature  . CHF (congestive heart failure) (HCC)    not on any meds  . Chronic back pain    stenosis  . Chronic kidney disease, unspecified   . Coronary atherosclerosis of artery bypass graft   . Coronary atherosclerosis of native coronary artery 2005   s/p surgery  . Drug abuse    hx; tested for cocaine as recently as 2/08. says he is not using drugs now - avoided defib. for this reason   . GERD (gastroesophageal reflux disease)    takes OTC meds as needed  . Glaucoma    uses eye drops daily  . Gout, unspecified    takes Allopurinol daily as well as Colchicine-if needed  . Hepatitis 1967   Hep C  . History of colon polyps    benign  . HTN (hypertension)    takes Coreg,Imdur.and Apresoline daily  . Human  immunodeficiency virus (HIV) disease ([place])    takes Genvoya daily  . Hyperlipidemia    takes Atorvastatin daily  . Ischemic cardiomyopathy   . Muscle spasm    takes Zanaflex as needed  . Nocturia   . Peripheral neuropathy    takes gabapentin daily  . Pneumonia    hx of  . Shortness of breath dyspnea    rarely but if notices it then with exertion  . Syphilis, unspecified   . Type II or unspecified type diabetes mellitus without mention of  complication, not stated as uncontrolled 2004   Lantus daily.Average fasting blood sugar 125-199    Social History  Substance Use Topics  . Smoking status: Current Every Day Smoker    Packs/day: 0.50    Years: 41.00    Types: Cigarettes  . Smokeless tobacco: Never Used  . Alcohol use 3.6 oz/week    6 Standard drinks or equivalent per week     Comment: occ.    Family History  Problem Relation Age of Onset  . Heart failure Father   . Hypertension Father   . Diabetes Brother   . Heart attack Brother   . Alzheimer's disease Mother   . Stroke Sister   . Diabetes Sister   . Alzheimer's disease Sister   . Hypertension Brother   . Diabetes Brother   . Drug abuse Brother   . Colon cancer Neg Hx     Allergies  Allergen Reactions  . Amphetamines Other (See Comments)    Pt is unaware of this -     Objective:  Vitals:   07/11/17 1036  BP: (!) 152/77  Pulse: 62  Temp: 98.7 F (37.1 C)  TempSrc: Oral  Weight: 198 lb 6.4 oz (90 kg)   Body mass index is 25.47 kg/m.  Physical Exam  Constitutional: He is oriented to person, place, and time.  He is in good spirits.  HENT:  Mouth/Throat: No oropharyngeal exudate.  Eyes: Conjunctivae are normal.  Cardiovascular: Normal rate and regular rhythm.   No murmur heard. Pulmonary/Chest: Effort normal and breath sounds normal. He has no wheezes. He has no rales.  Abdominal: Soft. He exhibits no mass. There is no tenderness.  Musculoskeletal: Normal range of motion.  Neurological: He is  alert and oriented to person, place, and time.  Skin: No rash noted.  Psychiatric: Mood and affect normal.    Lab Results Lab Results  Component Value Date   WBC 5.1 06/27/2017   HGB 14.2 06/27/2017   HCT 42.0 06/27/2017   MCV 95.5 06/27/2017   PLT 179 06/27/2017    Lab Results  Component Value Date   CREATININE 3.16 (H) 06/27/2017   BUN 59 (H) 06/27/2017   NA 137 06/27/2017   K 5.5 (H) 06/27/2017   CL 110 06/27/2017   CO2 15 (L) 06/27/2017    Lab Results  Component Value Date   ALT 19 06/27/2017   AST 24 06/27/2017   ALKPHOS 54 06/27/2017   BILITOT 0.3 06/27/2017    Lab Results  Component Value Date   CHOL 178 03/15/2017   HDL 30 (L) 03/15/2017   LDLCALC 100 (H) 03/15/2017   TRIG 239 (H) 03/15/2017   CHOLHDL 5.9 (H) 03/15/2017   Lab Results  Component Value Date   LABRPR REACTIVE (A) 06/27/2017   RPRTITER 1:1 06/27/2017   HIV 1 RNA Quant (copies/mL)  Date Value  06/27/2017 <20 DETECTED (A)  06/28/2016 <20  12/29/2015 <20   CD4 T Cell Abs (/uL)  Date Value  06/27/2017 970  06/28/2016 1,330  12/29/2015 1,050     Problem List Items Addressed This Visit      High   Human immunodeficiency virus (HIV) disease ([place])    His infection is under excellent long-term control. I will continue Genvoya for now but see him back after lab work in 6 months. If his renal function continues to deteriorate we will need to switch Genvoya to renally adjusted individual components. Genvoya, itself, does not adversely impact renal function.  Relevant Orders   T-helper cell (CD4)- ([facility] clinic only)   HIV 1 RNA quant-no reflex-bld   CBC   RPR   BASIC METABOLIC PANEL WITH GFR    Other Visit Diagnoses    Flu vaccine need    -  Primary   HIV disease ([place])       Relevant Orders   Flu Vaccine QUAD 36+ mos IM (Completed)        [name], MD [facility] for [place] [phone] pager   [phone] cell 07/11/2017,  10:51 AM

## 2017-07-18 ENCOUNTER — Other Ambulatory Visit: Payer: Medicare Other

## 2017-07-18 DIAGNOSIS — E11621 Type 2 diabetes mellitus with foot ulcer: Secondary | ICD-10-CM | POA: Diagnosis not present

## 2017-07-18 DIAGNOSIS — N183 Chronic kidney disease, stage 3 unspecified: Secondary | ICD-10-CM

## 2017-07-18 DIAGNOSIS — E782 Mixed hyperlipidemia: Secondary | ICD-10-CM

## 2017-07-18 DIAGNOSIS — L97528 Non-pressure chronic ulcer of other part of left foot with other specified severity: Secondary | ICD-10-CM

## 2017-07-19 ENCOUNTER — Encounter: Payer: Self-pay | Admitting: Podiatry

## 2017-07-19 ENCOUNTER — Ambulatory Visit (INDEPENDENT_AMBULATORY_CARE_PROVIDER_SITE_OTHER): Payer: Medicare Other | Admitting: Podiatry

## 2017-07-19 DIAGNOSIS — I70235 Atherosclerosis of native arteries of right leg with ulceration of other part of foot: Secondary | ICD-10-CM

## 2017-07-19 DIAGNOSIS — E0842 Diabetes mellitus due to underlying condition with diabetic polyneuropathy: Secondary | ICD-10-CM | POA: Diagnosis not present

## 2017-07-19 DIAGNOSIS — L84 Corns and callosities: Secondary | ICD-10-CM

## 2017-07-19 LAB — COMPLETE METABOLIC PANEL WITH GFR
AG RATIO: 1 (calc) (ref 1.0–2.5)
ALBUMIN MSPROF: 3.8 g/dL (ref 3.6–5.1)
ALKALINE PHOSPHATASE (APISO): 52 U/L (ref 40–115)
ALT: 16 U/L (ref 9–46)
AST: 22 U/L (ref 10–35)
BILIRUBIN TOTAL: 0.4 mg/dL (ref 0.2–1.2)
BUN / CREAT RATIO: 16 (calc) (ref 6–22)
BUN: 44 mg/dL — ABNORMAL HIGH (ref 7–25)
CHLORIDE: 111 mmol/L — AB (ref 98–110)
CO2: 24 mmol/L (ref 20–32)
Calcium: 9.5 mg/dL (ref 8.6–10.3)
Creat: 2.79 mg/dL — ABNORMAL HIGH (ref 0.70–1.25)
GFR, Est African American: 26 mL/min/{1.73_m2} — ABNORMAL LOW (ref >=60)
GFR, Est Non African American: 22 mL/min/{1.73_m2} — ABNORMAL LOW (ref >=60)
GLOBULIN: 4 g/dL — AB (ref 1.9–3.7)
Glucose, Bld: 73 mg/dL (ref 65–99)
Potassium: 5.5 mmol/L — ABNORMAL HIGH (ref 3.5–5.3)
SODIUM: 139 mmol/L (ref 135–146)
Total Protein: 7.8 g/dL (ref 6.1–8.1)

## 2017-07-19 LAB — LIPID PANEL
CHOL/HDL RATIO: 5.2 (calc) — AB (ref <5.0)
CHOLESTEROL: 177 mg/dL (ref <200)
HDL: 34 mg/dL — ABNORMAL LOW (ref >40)
LDL Cholesterol (Calc): 104 mg/dL (calc) — ABNORMAL HIGH
Non-HDL Cholesterol (Calc): 143 mg/dL (calc) — ABNORMAL HIGH (ref <130)
Triglycerides: 295 mg/dL — ABNORMAL HIGH (ref <150)

## 2017-07-19 LAB — HEMOGLOBIN A1C
HEMOGLOBIN A1C: 7 %{Hb} — AB (ref <5.7)
MEAN PLASMA GLUCOSE: 154 (calc)
eAG (mmol/L): 8.5 (calc)

## 2017-07-19 LAB — TSH: TSH: 0.81 mIU/L (ref 0.40–4.50)

## 2017-07-20 ENCOUNTER — Telehealth: Payer: Self-pay | Admitting: *Deleted

## 2017-07-20 NOTE — Telephone Encounter (Signed)
Patient called and requested for [place] to fax his labwork to his nurse with [facility]. Patient verbally stated it was ok to fax to [place] with [facility]#:[phone]. Faxed.

## 2017-07-20 NOTE — Progress Notes (Signed)
   Subjective: Patient with diabetes mellitus presents to the office today for follow-up evaluation of a right foot sprain and pre-ulcerative calluses to bilateral feet. He states the ulcers and the sprain foot are improving. There are no modifying factors noted. Patient presents today for further treatment and evaluation.   Past Medical History:  Diagnosis Date  . Arthritis   . Cataracts, bilateral    immature  . CHF (congestive heart failure) (HCC)    not on any meds  . Chronic back pain    stenosis  . Chronic kidney disease, unspecified   . Coronary atherosclerosis of artery bypass graft   . Coronary atherosclerosis of native coronary artery 2005   s/p surgery  . Drug abuse    hx; tested for cocaine as recently as 2/08. says he is not using drugs now - avoided defib. for this reason   . GERD (gastroesophageal reflux disease)    takes OTC meds as needed  . Glaucoma    uses eye drops daily  . Gout, unspecified    takes Allopurinol daily as well as Colchicine-if needed  . Hepatitis 1967   Hep C  . History of colon polyps    benign  . HTN (hypertension)    takes Coreg,Imdur.and Apresoline daily  . Human immunodeficiency virus (HIV) disease ([place])    takes Genvoya daily  . Hyperlipidemia    takes Atorvastatin daily  . Ischemic cardiomyopathy   . Muscle spasm    takes Zanaflex as needed  . Nocturia   . Peripheral neuropathy    takes gabapentin daily  . Pneumonia    hx of  . Shortness of breath dyspnea    rarely but if notices it then with exertion  . Syphilis, unspecified   . Type II or unspecified type diabetes mellitus without mention of complication, not stated as uncontrolled 2004   Lantus daily.Average fasting blood sugar 125-199     Objective:  Physical Exam General: Alert and oriented x3 in no acute distress  Dermatology: Hyperkeratotic lesion present on the right foot 4. Pain on palpation with a central nucleated core noted.  Skin is warm, dry and supple  bilateral lower extremities. Negative for open lesions or macerations.  Vascular: Palpable pedal pulses bilaterally. No edema or erythema noted. Capillary refill within normal limits.  Neurological: Epicritic and protective threshold diminished bilaterally.   Musculoskeletal Exam: Pain on palpation at the keratotic lesion noted. Range of motion within normal limits bilateral. Muscle strength 5/5 in all groups bilateral.  Assessment: #1 Diabetes mellitus w/ peripheral neuropathy #2 pre-ulcerative callus lesions plantar right foot 4   Plan of Care:  #1 Patient evaluated. #2 Excisional debridement of keratotic lesions using a chisel blade was performed without incident.  #3 Dressed area with light dressing. #4 continue wearing DM shoes. #5 Patient is to return to the clinic in 8 weeks.    Edrick Kins, DPM [facility]  Dr. Edrick Kins, [place]                                        [place], [place] [address]                Office [phone]  Fax [phone]

## 2017-07-21 ENCOUNTER — Ambulatory Visit (INDEPENDENT_AMBULATORY_CARE_PROVIDER_SITE_OTHER): Payer: Medicare Other

## 2017-07-21 ENCOUNTER — Ambulatory Visit (INDEPENDENT_AMBULATORY_CARE_PROVIDER_SITE_OTHER): Payer: Medicare Other | Admitting: Internal Medicine

## 2017-07-21 ENCOUNTER — Encounter: Payer: Self-pay | Admitting: Internal Medicine

## 2017-07-21 ENCOUNTER — Other Ambulatory Visit: Payer: Self-pay

## 2017-07-21 VITALS — BP 112/68 | HR 59 | Temp 98.1°F | Resp 20 | Ht 74.0 in | Wt 200.2 lb

## 2017-07-21 VITALS — BP 112/68 | HR 59 | Temp 98.1°F | Ht 74.0 in | Wt 200.0 lb

## 2017-07-21 DIAGNOSIS — I5042 Chronic combined systolic (congestive) and diastolic (congestive) heart failure: Secondary | ICD-10-CM | POA: Diagnosis not present

## 2017-07-21 DIAGNOSIS — E11621 Type 2 diabetes mellitus with foot ulcer: Secondary | ICD-10-CM | POA: Diagnosis not present

## 2017-07-21 DIAGNOSIS — Z720 Tobacco use: Secondary | ICD-10-CM

## 2017-07-21 DIAGNOSIS — L97528 Non-pressure chronic ulcer of other part of left foot with other specified severity: Secondary | ICD-10-CM

## 2017-07-21 DIAGNOSIS — N183 Chronic kidney disease, stage 3 unspecified: Secondary | ICD-10-CM

## 2017-07-21 DIAGNOSIS — E782 Mixed hyperlipidemia: Secondary | ICD-10-CM | POA: Diagnosis not present

## 2017-07-21 DIAGNOSIS — Z794 Long term (current) use of insulin: Secondary | ICD-10-CM | POA: Diagnosis not present

## 2017-07-21 DIAGNOSIS — B20 Human immunodeficiency virus [HIV] disease: Secondary | ICD-10-CM

## 2017-07-21 DIAGNOSIS — Z0000 Encounter for general adult medical examination without abnormal findings: Secondary | ICD-10-CM

## 2017-07-21 DIAGNOSIS — I10 Essential (primary) hypertension: Secondary | ICD-10-CM

## 2017-07-21 DIAGNOSIS — E1140 Type 2 diabetes mellitus with diabetic neuropathy, unspecified: Secondary | ICD-10-CM | POA: Diagnosis not present

## 2017-07-21 MED ORDER — HYDRALAZINE HCL 25 MG PO TABS: 25.0000 mg | ORAL_TABLET | Freq: Three times a day (TID) | ORAL | 2 refills | Status: DC

## 2017-07-21 MED ORDER — ZOSTER VAC RECOMB ADJUVANTED 50 MCG/0.5ML IM SUSR: 0.5000 mL | Freq: Once | INTRAMUSCULAR | 1 refills | Status: AC

## 2017-07-21 NOTE — Progress Notes (Signed)
[name], male   DOB: [birth year], 66 y.o.   MRN: [id number]   Location:  [facility]  Place of Service:  OFFICE  Provider: Arletha Grippe, DO  Patient Care Team: Gildardo Cranker, DO as PCP - General (Internal Medicine) [name], MD as PCP - Infectious Diseases (Infectious Diseases) [name], MD as Consulting Physician (Ophthalmology) [name], MD as Consulting Physician (Cardiology) [name], MD as Consulting Physician (Infectious Diseases) Gardiner Barefoot, DPM as Consulting Physician (Podiatry)  Extended Emergency Contact Information Primary Emergency Contact: Bertrum Sol Address: [address]          [place], [place] [address] Johnnette Litter of [place] Phone: [phone] Relation: Friend Secondary Emergency Contact: Dellia Beckwith [place] of [place] Phone: [phone] Relation: Sister  Code Status: FULL CODE Goals of Care: Advanced Directive information Advanced Directives 07/21/2017  Does Patient Have a Medical Advance Directive? Yes  Type of Advance Directive Living will;Healthcare Power of Attorney  Does patient want to make changes to medical advance directive? No - Patient declined  Copy of [place] in Chart? Yes  Would patient like information on creating a medical advance directive? -     Chief Complaint  Patient presents with  . Annual Exam    MMSE-29/30    HPI: Patient is a 66 y.o. male seen in today for an annual wellness exam.  He reports feeling well overall. Left 1st Toe has improved. He sees Dr Amalia Hailey. He is followed by ID and nephrology. AWV note from today reviewed.  Tob abuse - still smoking but down to < 1/2 ppd. He would like to start using nicotine gum.   HIV - stable on HAART tx. HIV1 RNA quant <20; log <1.3. CD4 abs 970. Followed by ID Dr [name] annually  HTN/CHF - BP stable on hydralazine, coreg, imdur. Off ARB since previous hospital d/c. Followed by Dr [name]    DM - controlled. He takes 1/2 dose lantus on each side of abdomen.  No low BS reactions. Numbness/tingling in feet. Takes Lantus  insulin 70 units qhs and humalog 12 units qAC . Followed by podiatry. No ulcerations but has calluses. A1c 7%. He is not on ARB due to AKI and hyperkalemia. He has an eye specialist. He has diabetic foot ulcer  Hyperlipidemia - borderline controlled. Takes lipitor. LDL 104; TG 295; HDL 34  CKD  - borderline stage 3. Cr 2.79. Followed by nephrology Dr [name]   Depression screen [facility] 2/9 07/21/2017 07/11/2017 11/22/2016 08/18/2016 07/12/2016  Decreased Interest 0 0 0 0 0  Down, Depressed, Hopeless 0 0 0 0 0  PHQ - 2 Score 0 0 0 0 0  Altered sleeping - - - - -  Tired, decreased energy - - - - -  Change in appetite - - - - -  Feeling bad or failure about yourself  - - - - -  Trouble concentrating - - - - -  Moving slowly or fidgety/restless - - - - -  Suicidal thoughts - - - - -  PHQ-9 Score - - - - -  Difficult doing work/chores - - - - -  Some recent data might be hidden    Fall Risk  07/21/2017 07/11/2017 05/01/2017 04/14/2017 11/22/2016  Falls in the past year? No No No No Yes  Comment - - - - -  Number falls in past yr: - - - - 2 or more  Comment - - - - -  Injury with Fall? - - - - Yes  Comment - - - - -  Risk Factor Category  - - - - High Fall Risk  Risk for fall due to : - - - - Medication side effect;History of fall(s);Impaired vision  Risk for fall due to: Comment - - - - -  Follow up - - - - Education provided;Falls prevention discussed   MMSE - Mini Mental State Exam 07/21/2017 08/18/2016 08/07/2015  Not completed: - - (No Data)  Orientation to time 5 5 5   Orientation to Place 5 5 5   Registration 3 3 3   Attention/ Calculation 5 5 5   Recall 2 2 3   Language- name 2 objects 2 2 2   Language- repeat 1 1 1   Language- follow 3 step command 3 3 3   Language- read & follow direction 1 1 1   Write a sentence 1 1 1   Copy design 1 1 0  Total score 29 29 29       Health Maintenance  Topic Date Due  . FOOT EXAM  08/06/2016  . PNA vac Low Risk Adult (2 of 2 - PPSV23) 08/18/2017  . HEMOGLOBIN A1C  10/17/2017  . OPHTHALMOLOGY EXAM  06/30/2018  . LIPID PANEL  07/18/2018  . TETANUS/TDAP  07/27/2021  . COLONOSCOPY  10/01/2022  . INFLUENZA VACCINE  Completed  . Hepatitis C Screening  Completed     Past Medical History:  Diagnosis Date  . Arthritis   . Cataracts, bilateral    immature  . CHF (congestive heart failure) (HCC)    not on any meds  . Chronic back pain    stenosis  . Chronic kidney disease, unspecified   . Coronary atherosclerosis of artery bypass graft   . Coronary atherosclerosis of native coronary artery 2005   s/p surgery  . Drug abuse    hx; tested for cocaine as recently as 2/08. says he is not using drugs now - avoided defib. for this reason   . GERD (gastroesophageal reflux disease)    takes OTC meds as needed  . Glaucoma    uses eye drops daily  . Gout, unspecified    takes Allopurinol daily as well as Colchicine-if needed  . Hepatitis 1967   Hep C  . History of colon polyps    benign  . HTN (hypertension)    takes Coreg,Imdur.and Apresoline daily  . Human immunodeficiency virus (HIV) disease ([place])    takes Genvoya daily  . Hyperlipidemia    takes Atorvastatin daily  . Ischemic cardiomyopathy   . Muscle spasm    takes Zanaflex as needed  . Nocturia   . Peripheral neuropathy    takes gabapentin daily  . Pneumonia    hx of  . Shortness of breath dyspnea    rarely but if notices it then with exertion  . Syphilis, unspecified   . Type II or unspecified type diabetes mellitus without mention of complication, not stated as uncontrolled 2004   Lantus daily.Average fasting blood sugar 125-199    Past Surgical History:  Procedure Laterality Date  . COLONOSCOPY  2013   Dr.John Henrene Pastor   . CORONARY ARTERY BYPASS GRAFT    . laproscopic cholecystectomy  8/07  . left intertrochanteric hip fracture  2004    s/p intermedullary nail placement 2/08  . left transverse mandibular fracture  10/05  . LUMBAR LAMINECTOMY/DECOMPRESSION MICRODISCECTOMY N/A 02/29/2016   Procedure: Left L4-5 Lateral Recess Decompression, Removal Extradural Intraspinal Facet Cyst;  Surgeon: Marybelle Killings,  MD;  Location: [facility];  Service: Orthopedics;  Laterality: N/A;    Family History  Problem Relation Age of Onset  . Heart failure Father   . Hypertension Father   . Diabetes Brother   . Heart attack Brother   . Alzheimer's disease Mother   . Stroke Sister   . Diabetes Sister   . Alzheimer's disease Sister   . Hypertension Brother   . Diabetes Brother   . Drug abuse Brother   . Colon cancer Neg Hx    Family Status  Relation Status  . Father Deceased       heart failure  . Brother Alive  . Brother Alive  . Mother Deceased  . Sister Alive  . Brother Deceased       MI; also did cocaine   . Sister Alive  . Sister Alive  . Brother (Not Specified)  . Brother (Not Specified)  . MGM Deceased  . MGF Deceased  . PGM Deceased  . PGF Deceased  . Neg Hx (Not Specified)    shoulder  Social History   Social History  . Marital status: Single    Spouse name: N/A  . Number of children: 1  . Years of education: N/A   Occupational History  . retired   . part time- church    Social History Main Topics  . Smoking status: Current Every Day Smoker    Packs/day: 0.50    Years: 41.00    Types: Cigarettes  . Smokeless tobacco: Never Used  . Alcohol use 3.6 oz/week    6 Standard drinks or equivalent per week     Comment: occ.  . Drug use: No     Comment: hx of crack/cocaine 67yrs ago  . Sexual activity: Not Currently     Comment: declined condoms   Other Topics Concern  . Not on file   Social History Narrative   Retired.       As of 05/2015:   Diet: No salt    Caffeine   Married: Single    House: [place], 2 stories, 1 person (self)    Pets: No    Current/Past profession: N/A   Exercise: walks daily     Living Will: Yes    DNR   POA/HPOA: No           Allergies  Allergen Reactions  . Amphetamines Other (See Comments)    Pt is unaware of this -     Allergies as of 07/21/2017      Reactions   Amphetamines Other (See Comments)   Pt is unaware of this -       Medication List       Accurate as of 07/21/17  9:28 AM. Always use your most recent med list.          allopurinol 100 MG tablet Commonly known as:  ZYLOPRIM take 1 tablet by mouth once daily   ANORO ELLIPTA 62.5-25 MCG/INH Aepb Generic drug:  umeclidinium-vilanterol INHALE 1 PUFF INTO THE LUNGS DAILY   aspirin EC 81 MG tablet Take 81 mg by mouth daily.   atorvastatin 20 MG tablet Commonly known as:  LIPITOR take 1 tablet by mouth once daily   B-D UF III MINI PEN NEEDLES 31G X 5 MM Misc Generic drug:  Insulin Pen Needle use four times a day   carvedilol 25 MG tablet Commonly known as:  COREG take 1 tablet by mouth twice a day with meals   colchicine  0.6 MG tablet take 1 tablet by mouth CONTINUOUS AS NEEDED FOR GOUT   elvitegravir-cobicistat-emtricitabine-tenofovir 150-150-200-10 MG Tabs tablet Commonly known as:  GENVOYA TAKE 1 TABLET BY MOUTH DAILY WITH BREAKFAST.   GENVOYA 150-150-200-10 MG Tabs tablet Generic drug:  elvitegravir-cobicistat-emtricitabine-tenofovir TAKE 1 TABLET BY MOUTH DAILY WITH BREAKFAST.   Fenofibric Acid 135 MG Cpdr take 1 capsule by mouth once daily   furosemide 40 MG tablet Commonly known as:  LASIX Take 1 tablet (40 mg total) by mouth daily.   gabapentin 300 MG capsule Commonly known as:  NEURONTIN take 1 capsule by mouth three times a day   GLUCOSAMINE CHONDR 1500 COMPLX PO Take 2 tablets by mouth daily. Reported on 04/04/2016   glucose blood test strip Commonly known as:  ONE TOUCH ULTRA TEST Use to test blood sugar twice daily. Dx:E11.40   HUMALOG KWIKPEN 100 UNIT/ML KiwkPen Generic drug:  insulin lispro INJECT INTO SKIN 12 UNITS SUBCUTANEOUSLY 3 TIMES DAILY  BEFORE MEALS   hydrALAZINE 25 MG tablet Commonly known as:  APRESOLINE take 1 tablet by mouth three times a day   isosorbide mononitrate 30 MG 24 hr tablet Commonly known as:  IMDUR Take 30 mg by mouth daily. Reported on 04/04/2016   isosorbide mononitrate 30 MG 24 hr tablet Commonly known as:  IMDUR take 1 tablet by mouth once daily   LANTUS SOLOSTAR 100 UNIT/ML Solostar Pen Generic drug:  Insulin Glargine inject 70 units subcutaneously every morning (35 UNITS ON EACH SIDE)   latanoprost 0.005 % ophthalmic solution Commonly known as:  XALATAN Place 1 drop into both eyes at bedtime. Reported on 04/04/2016   Naftifine HCl 2 % Crea apply to affected area twice a day   nitroGLYCERIN 0.3 MG SL tablet Commonly known as:  NITROSTAT Place 1 tablet (0.3 mg total) under the tongue as needed for chest pain (not to exceed 3 tablets in one day.).   [place] LANCETS 98X Misc CHECK BLOOD SUGAR TWO TIMES DAILY        Review of Systems:  Review of Systems  Skin: Positive for wound.  All other systems reviewed and are negative.   Physical Exam: Vitals:   07/21/17 0903  BP: 112/68  Pulse: (!) 59  Resp: 20  Temp: 98.1 F (36.7 C)  TempSrc: Oral  SpO2: 96%  Weight: 200 lb 3.2 oz (90.8 kg)  Height: 6\' 2"  (1.88 m)   Body mass index is 25.7 kg/m. Physical Exam  Constitutional: He is oriented to person, place, and time. He appears well-developed and well-nourished. No distress.  HENT:  Head: Normocephalic and atraumatic.  Right Ear: External ear normal.  Left Ear: External ear normal.  Mouth/Throat: Oropharynx is clear and moist. No oropharyngeal exudate.  MMM; no oral thrush  Eyes: Pupils are equal, round, and reactive to light. EOM are normal. No scleral icterus.  Neck: Normal range of motion. Neck supple. Carotid bruit is not present. No tracheal deviation present. No thyromegaly present.  Cardiovascular: Normal rate, regular rhythm and intact distal pulses.  Exam  reveals no gallop and no friction rub.   Murmur (1/6 SEM) heard. No LE edema b/l. No calf TTP  Pulmonary/Chest: Effort normal and breath sounds normal. No respiratory distress. He has no wheezes. He has no rales. He exhibits no tenderness.  No rhonchi; no gynecosmastia  Abdominal: Soft. Bowel sounds are normal. He exhibits no distension and no mass. There is no hepatosplenomegaly or hepatomegaly. There is no tenderness. There is no rebound and no  guarding. No hernia. Hernia confirmed negative in the right inguinal area and confirmed negative in the left inguinal area.  Genitourinary: Prostate normal, testes normal and penis normal. Rectal exam shows external hemorrhoid (nonthrombosed). Rectal exam shows no internal hemorrhoid, no fissure, no mass, no tenderness, anal tone normal and guaiac negative stool. Prostate is not tender. Uncircumcised. No discharge found.  Musculoskeletal: He exhibits edema. He exhibits no deformity.  Lymphadenopathy:    He has no cervical adenopathy.       Right: No inguinal adenopathy present.       Left: No inguinal adenopathy present.  Neurological: He is alert and oriented to person, place, and time. He has normal reflexes.  Skin: Skin is warm and dry. No rash noted.  Psychiatric: He has a normal mood and affect. His behavior is normal. Judgment normal.  Vitals reviewed.  Diabetic Foot Exam - Simple   Simple Foot Form Diabetic Foot exam was performed with the following findings:  Yes 07/21/2017  9:57 AM  Visual Inspection See comments:  Yes Sensation Testing See comments:  Yes Pulse Check Comments Reduced monofilament L>R; left 1st toe ulcer dsg c/d/i; b/l large bunion; b/l toe deformities      Labs reviewed:  Basic Metabolic Panel:  Recent Labs  11/10/16 0757  03/15/17 0924 06/27/17 1028 07/18/17 0828  NA  --   < > 141 137 139  K  --   < > 4.5 5.5* 5.5*  CL  --   < > 112* 110 111*  CO2  --   < > 23 15* 24  GLUCOSE  --   < > 53* 137* 73  BUN   --   < > 35* 59* 44*  CREATININE  --   < > 2.37* 3.16* 2.79*  CALCIUM  --   < > 8.8 8.7 9.5  TSH 0.540  --   --   --  0.81  < > = values in this interval not displayed. Liver Function Tests:  Recent Labs  11/11/16 0616 03/15/17 0924 06/27/17 1028 07/18/17 0828  AST 24 25 24 22   ALT 24 20 19 16   ALKPHOS 65 65 54  --   BILITOT 0.5 0.3 0.3 0.4  PROT 7.2 7.5 7.4 7.8  ALBUMIN 2.9* 3.6 3.6  --     Recent Labs  10/23/16 0007  LIPASE 52*   No results for input(s): AMMONIA in the last 8760 hours. CBC:  Recent Labs  11/10/16 0457 11/11/16 0616 11/18/16 1154 06/27/17 1028  WBC 8.1 5.5 5.7 5.1  NEUTROABS 4.5 1.8 1,881  --   HGB 10.0* 9.7* 10.2* 14.2  HCT 31.6* 30.2* 33.1* 42.0  MCV 91.3 89.3 90.7 95.5  PLT 313 276 304 179   Lipid Panel:  Recent Labs  08/18/16 0850 03/15/17 0924 07/18/17 0828  CHOL 162 178 177  HDL 39* 30* 34*  LDLCALC 90 100*  --   TRIG 165* 239* 295*  CHOLHDL 4.2 5.9* 5.2*   Lab Results  Component Value Date   HGBA1C 7.0 (H) 07/18/2017    Procedures: Dg Foot Complete Right  Result Date: 06/30/2017 Please see detailed radiograph report in office note.   Assessment/Plan   ICD-10-CM   1. Well adult exam Z00.00   2. Mixed hyperlipidemia E78.2 Lipid Panel  3. Type 2 diabetes mellitus with diabetic neuropathy, with long-term current use of insulin (HCC) E11.40 Hemoglobin A1c   Z79.4 Microalbumin/Creatinine Ratio, Urine  4. Essential hypertension I10   5. Chronic kidney disease,  stage III (moderate) (HCC) N18.3   6. Tobacco abuse Z72.0   7. Human immunodeficiency virus (HIV) disease ([place]) B20   8. Diabetic ulcer of toe of left foot associated with type 2 diabetes mellitus, with other ulcer severity ([place]) E11.621    L97.528   9. Chronic combined systolic and diastolic congestive heart failure (HCC) I50.42    Continue current medications as ordered  Follow up with specialists as scheduled  Maintain low fat/low cholesterol  meals  Smoking cessation discussed and highly urged  Follow up in 3 mos for hyperlipidemia, HTN, DM. Fasting labs prior to appt  [place] S. Perlie Gold  [facility] [address] [place], [place] [address] [phone] Cell (Monday-Friday 8 AM - 5 PM) [phone] After 5 PM and follow prompts

## 2017-07-21 NOTE — Progress Notes (Signed)
Subjective:   Jacob [name] is a 66 y.o. male who presents for Medicare Annual/Subsequent preventive examination  Last AWV-08/18/2016       Objective:    Vitals: BP 112/68 (BP Location: Left Arm, Patient Position: Sitting)   Pulse (!) 59   Temp 98.1 F (36.7 C) (Oral)   Ht 6\' 2"  (1.88 m)   Wt 200 lb (90.7 kg)   SpO2 96%   BMI 25.68 kg/m   Body mass index is 25.68 kg/m.  Tobacco History  Smoking Status  . Current Every Day Smoker  . Packs/day: 0.50  . Years: 41.00  . Types: Cigarettes  Smokeless Tobacco  . Never Used     Ready to quit: Not Answered Counseling given: Not Answered   Past Medical History:  Diagnosis Date  . Arthritis   . Cataracts, bilateral    immature  . CHF (congestive heart failure) (HCC)    not on any meds  . Chronic back pain    stenosis  . Chronic kidney disease, unspecified   . Coronary atherosclerosis of artery bypass graft   . Coronary atherosclerosis of native coronary artery 2005   s/p surgery  . Drug abuse    hx; tested for cocaine as recently as 2/08. says he is not using drugs now - avoided defib. for this reason   . GERD (gastroesophageal reflux disease)    takes OTC meds as needed  . Glaucoma    uses eye drops daily  . Gout, unspecified    takes Allopurinol daily as well as Colchicine-if needed  . Hepatitis 1967   Hep C  . History of colon polyps    benign  . HTN (hypertension)    takes Coreg,Imdur.and Apresoline daily  . Human immunodeficiency virus (HIV) disease ([place])    takes Genvoya daily  . Hyperlipidemia    takes Atorvastatin daily  . Ischemic cardiomyopathy   . Muscle spasm    takes Zanaflex as needed  . Nocturia   . Peripheral neuropathy    takes gabapentin daily  . Pneumonia    hx of  . Shortness of breath dyspnea    rarely but if notices it then with exertion  . Syphilis, unspecified   . Type II or unspecified type diabetes mellitus without mention of complication, not stated as uncontrolled 2004    Lantus daily.Average fasting blood sugar 125-199   Past Surgical History:  Procedure Laterality Date  . COLONOSCOPY  2013   Dr.John Henrene Pastor   . CORONARY ARTERY BYPASS GRAFT    . laproscopic cholecystectomy  8/07  . left intertrochanteric hip fracture  2004   s/p intermedullary nail placement 2/08  . left transverse mandibular fracture  10/05  . LUMBAR LAMINECTOMY/DECOMPRESSION MICRODISCECTOMY N/A 02/29/2016   Procedure: Left L4-5 Lateral Recess Decompression, Removal Extradural Intraspinal Facet Cyst;  Surgeon: Marybelle Killings, MD;  Location: [facility];  Service: Orthopedics;  Laterality: N/A;   Family History  Problem Relation Age of Onset  . Heart failure Father   . Hypertension Father   . Diabetes Brother   . Heart attack Brother   . Alzheimer's disease Mother   . Stroke Sister   . Diabetes Sister   . Alzheimer's disease Sister   . Hypertension Brother   . Diabetes Brother   . Drug abuse Brother   . Colon cancer Neg Hx    History  Sexual Activity  . Sexual activity: Not Currently    Comment: declined condoms    Outpatient  Encounter Prescriptions as of 07/21/2017  Medication Sig  . allopurinol (ZYLOPRIM) 100 MG tablet take 1 tablet by mouth once daily  . ANORO ELLIPTA 62.5-25 MCG/INH AEPB INHALE 1 PUFF INTO THE LUNGS DAILY  . aspirin EC 81 MG tablet Take 81 mg by mouth daily.  [name] atorvastatin (LIPITOR) 20 MG tablet take 1 tablet by mouth once daily  . B-D UF III MINI PEN NEEDLES 31G X 5 MM MISC use four times a day  . carvedilol (COREG) 25 MG tablet take 1 tablet by mouth twice a day with meals  . Choline Fenofibrate (FENOFIBRIC ACID) 135 MG CPDR take 1 capsule by mouth once daily  . colchicine 0.6 MG tablet take 1 tablet by mouth CONTINUOUS AS NEEDED FOR GOUT (Patient not taking: Reported on 07/11/2017)  . elvitegravir-cobicistat-emtricitabine-tenofovir (GENVOYA) 150-150-200-10 MG TABS tablet TAKE 1 TABLET BY MOUTH DAILY WITH BREAKFAST.  . furosemide (LASIX) 40 MG tablet Take 1  tablet (40 mg total) by mouth daily.  [name] gabapentin (NEURONTIN) 300 MG capsule take 1 capsule by mouth three times a day  . GENVOYA 150-150-200-10 MG TABS tablet TAKE 1 TABLET BY MOUTH DAILY WITH BREAKFAST.  [name] Glucosamine-Chondroit-Vit C-Mn (GLUCOSAMINE CHONDR 1500 COMPLX PO) Take 2 tablets by mouth daily. Reported on 04/04/2016  . glucose blood (ONE TOUCH ULTRA TEST) test strip Use to test blood sugar twice daily. Dx:E11.40  . HUMALOG KWIKPEN 100 UNIT/ML KiwkPen INJECT INTO SKIN 12 UNITS SUBCUTANEOUSLY 3 TIMES DAILY BEFORE MEALS  . hydrALAZINE (APRESOLINE) 25 MG tablet take 1 tablet by mouth three times a day  . isosorbide mononitrate (IMDUR) 30 MG 24 hr tablet Take 30 mg by mouth daily. Reported on 04/04/2016  . isosorbide mononitrate (IMDUR) 30 MG 24 hr tablet take 1 tablet by mouth once daily (Patient not taking: Reported on 07/11/2017)  . LANTUS SOLOSTAR 100 UNIT/ML Solostar Pen inject 70 units subcutaneously every morning (35 UNITS ON EACH SIDE)  . latanoprost (XALATAN) 0.005 % ophthalmic solution Place 1 drop into both eyes at bedtime. Reported on 04/04/2016  . Naftifine HCl 2 % CREA apply to affected area twice a day  . nitroGLYCERIN (NITROSTAT) 0.3 MG SL tablet Place 1 tablet (0.3 mg total) under the tongue as needed for chest pain (not to exceed 3 tablets in one day.).  [name] ONETOUCH DELICA LANCETS 67T MISC CHECK BLOOD SUGAR TWO TIMES DAILY  . [DISCONTINUED] ciprofloxacin (CIPRO) 500 MG tablet Take 1 tablet (500 mg total) by mouth 2 (two) times daily. (Patient not taking: Reported on 07/11/2017)  . [DISCONTINUED] clindamycin (CLEOCIN) 150 MG capsule Take 3 capsules (450 mg total) by mouth 3 (three) times daily. (Patient not taking: Reported on 07/11/2017)  . [DISCONTINUED] HYDROcodone-acetaminophen (NORCO/VICODIN) 5-325 MG tablet take 1 tablet by mouth every 4 hours if needed for pain   No facility-administered encounter medications on file as of 07/21/2017.     Activities of Daily Living In  your present state of health, do you have any difficulty performing the following activities: 07/21/2017 07/11/2017  Hearing? N N  Vision? N N  Difficulty concentrating or making decisions? N N  Comment - -  Walking or climbing stairs? N N  Dressing or bathing? N N  Doing errands, shopping? N [place] and eating ? N -  Using the Toilet? N -  In the past six months, have you accidently leaked urine? N -  Do you have problems with loss of bowel control? N -  Managing  your Medications? N -  Managing your Finances? N -  Housekeeping or managing your Housekeeping? N -  Some recent data might be hidden    Patient Care Team: Gildardo Cranker, DO as PCP - General (Internal Medicine) [name], MD as PCP - Infectious Diseases (Infectious Diseases) [name], MD as Consulting Physician (Ophthalmology) [name], MD as Consulting Physician (Cardiology) [name], MD as Consulting Physician (Infectious Diseases) Gardiner Barefoot, DPM as Consulting Physician (Podiatry)   Assessment:     Exercise Activities and Dietary recommendations Current Exercise Habits: Home exercise routine, Type of exercise: walking, Time (Minutes): 20, Frequency (Times/Week): 1, Weekly Exercise (Minutes/Week): 20, Intensity: Mild, Exercise limited by: None identified  Goals    . Blood Pressure < 140/90    . Exercise 3x per week (30 min per time)          Pt will start using silver sneakers 2-3 times a week    . HEMOGLOBIN A1C < 7.0    . LDL CALC < 100    . Quit smoking / using tobacco          Starting 08/18/16, I will attempt to stop smoking over the next year.       Fall Risk Fall Risk  07/21/2017 07/11/2017 05/01/2017 04/14/2017 11/22/2016  Falls in the past year? No No No No Yes  Comment - - - - -  Number falls in past yr: - - - - 2 or more  Comment - - - - -  Injury with Fall? - - - - Yes  Comment - - - - -  Risk Factor Category  - - - - High Fall Risk  Risk for  fall due to : - - - - Medication side effect;History of fall(s);Impaired vision  Risk for fall due to: Comment - - - - -  Follow up - - - - Education provided;Falls prevention discussed   Depression Screen PHQ 2/9 Scores 07/21/2017 07/11/2017 11/22/2016 08/18/2016  PHQ - 2 Score 0 0 0 0  PHQ- 9 Score - - - -    Cognitive Function MMSE - Mini Mental State Exam 07/21/2017 08/18/2016 08/07/2015  Not completed: - - (No Data)  Orientation to time 5 5 5   Orientation to Place 5 5 5   Registration 3 3 3   Attention/ Calculation 5 5 5   Recall 2 2 3   Language- name 2 objects 2 2 2   Language- repeat 1 1 1   Language- follow 3 step command 3 3 3   Language- read & follow direction 1 1 1   Write a sentence 1 1 1   Copy design 1 1 0  Total score 29 29 29         Immunization History  Administered Date(s) Administered  . Hepatitis B 08/28/2006, 10/02/2007, 04/01/2008  . Hepatitis B, adult 06/03/2014, 07/04/2014  . Influenza Split 07/28/2011  . Influenza Whole 08/28/2006, 09/10/2007, 09/15/2008, 08/03/2009, 07/26/2010  . Influenza,inj,Quad PF,6+ Mos 07/04/2014, 07/06/2015, 07/12/2016, 07/11/2017  . Influenza-Unspecified 06/24/2013  . Pneumococcal Conjugate-13 08/18/2016  . Pneumococcal Polysaccharide-23 08/28/2006, 07/28/2011  . Zoster 06/03/2014   Screening Tests Health Maintenance  Topic Date Due  . FOOT EXAM  08/06/2016  . PNA vac Low Risk Adult (2 of 2 - PPSV23) 08/18/2017  . HEMOGLOBIN A1C  10/17/2017  . OPHTHALMOLOGY EXAM  06/30/2018  . LIPID PANEL  07/18/2018  . TETANUS/TDAP  07/27/2021  . COLONOSCOPY  10/01/2022  . INFLUENZA VACCINE  Completed  . Hepatitis C Screening  Completed  Plan:    I have personally reviewed and addressed the Medicare Annual Wellness questionnaire and have noted the following in the patient's chart:  A. Medical and social history B. Use of alcohol, tobacco or illicit drugs  C. Current medications and supplements D. Functional ability and status E.   Nutritional status F.  Physical activity G. Advance directives H. List of other physicians I.  Hospitalizations, surgeries, and ER visits in previous 12 months J.  [place] to include hearing, vision, cognitive, depression L. Referrals and appointments - none  In addition, I have reviewed and discussed with patient certain preventive protocols, quality metrics, and best practice recommendations. A written personalized care plan for preventive services as well as general preventive health recommendations were provided to patient.  See attached scanned questionnaire for additional information.   Signed,   Rich Reining, RN Nurse Health Advisor   Quick Notes   Health Maintenance: Foot exam due.     Abnormal Screen: MMSE 29/30. Passed clock drawing     Patient Concerns: None     Nurse Concerns: None

## 2017-07-21 NOTE — Telephone Encounter (Signed)
Pt called to ask for a refill on hydralazine and a prescription for shingrix. Rx were sent electronically to pharmacy.

## 2017-07-21 NOTE — Patient Instructions (Signed)
[name] , Thank you for taking time to come for your Medicare Wellness Visit. I appreciate your ongoing commitment to your health goals. Please review the following plan we discussed and let me know if I can assist you in the future.   Screening recommendations/referrals: Colonoscopy up to date. Due 08/06/23 Recommended yearly ophthalmology/optometry visit for glaucoma screening and checkup Recommended yearly dental visit for hygiene and checkup  Vaccinations: Influenza vaccine up to date.  Pneumococcal vaccine up to date Tdap vaccine up to date. Due 08/09/22 Shingles vaccine due, check with insurance to see if covered. If you decide you want it we will send prescription to your pharamcy  Advanced directives: None  Conditions/risks identified: None  Next appointment: Dr [name] [birth year] @ 9:45am  Preventive Care 68 Years and Older, Male Preventive care refers to lifestyle choices and visits with your health care provider that can promote health and wellness. What does preventive care include?  A yearly physical exam. This is also called an annual well check.  Dental exams once or twice a year.  Routine eye exams. Ask your health care provider how often you should have your eyes checked.  Personal lifestyle choices, including:  Daily care of your teeth and gums.  Regular physical activity.  Eating a healthy diet.  Avoiding tobacco and drug use.  Limiting alcohol use.  Practicing safe sex.  Taking low doses of aspirin every day.  Taking vitamin and mineral supplements as recommended by your health care provider. What happens during an annual well check? The services and screenings done by your health care provider during your annual well check will depend on your age, overall health, lifestyle risk factors, and family history of disease. Counseling  Your health care provider may ask you questions about your:  Alcohol use.  Tobacco use.  Drug use.  Emotional  well-being.  Home and relationship well-being.  Sexual activity.  Eating habits.  History of falls.  Memory and ability to understand (cognition).  Work and work Statistician. Screening  You may have the following tests or measurements:  Height, weight, and BMI.  Blood pressure.  Lipid and cholesterol levels. These may be checked every 5 years, or more frequently if you are over 68 years old.  Skin check.  Lung cancer screening. You may have this screening every year starting at age 68 if you have a 30-pack-year history of smoking and currently smoke or have quit within the past 15 years.  Fecal occult blood test (FOBT) of the stool. You may have this test every year starting at age 68.  Flexible sigmoidoscopy or colonoscopy. You may have a sigmoidoscopy every 5 years or a colonoscopy every 10 years starting at age 68.  Prostate cancer screening. Recommendations will vary depending on your family history and other risks.  Hepatitis C blood test.  Hepatitis B blood test.  Sexually transmitted disease (STD) testing.  Diabetes screening. This is done by checking your blood sugar (glucose) after you have not eaten for a while (fasting). You may have this done every 1-3 years.  Abdominal aortic aneurysm (AAA) screening. You may need this if you are a current or former smoker.  Osteoporosis. You may be screened starting at age 68 if you are at high risk. Talk with your health care provider about your test results, treatment options, and if necessary, the need for more tests. Vaccines  Your health care provider may recommend certain vaccines, such as:  Influenza vaccine. This is recommended every year.  Tetanus, diphtheria, and acellular pertussis (Tdap, Td) vaccine. You may need a Td booster every 10 years.  Zoster vaccine. You may need this after age 68.  Pneumococcal 13-valent conjugate (PCV13) vaccine. One dose is recommended after age 68.  Pneumococcal  polysaccharide (PPSV23) vaccine. One dose is recommended after age 68. Talk to your health care provider about which screenings and vaccines you need and how often you need them. This information is not intended to replace advice given to you by your health care provider. Make sure you discuss any questions you have with your health care provider. Document Released: 11/06/2015 Document Revised: 06/29/2016 Document Reviewed: 08/11/2015 Elsevier Interactive Patient Education  2017 [place] Prevention in the Home Falls can cause injuries. They can happen to people of all ages. There are many things you can do to make your home safe and to help prevent falls. What can I do on the outside of my home?  Regularly fix the edges of walkways and driveways and fix any cracks.  Remove anything that might make you trip as you walk through a door, such as a raised step or threshold.  Trim any bushes or trees on the path to your home.  Use bright outdoor lighting.  Clear any walking paths of anything that might make someone trip, such as rocks or tools.  Regularly check to see if handrails are loose or broken. Make sure that both sides of any steps have handrails.  Any raised decks and porches should have guardrails on the edges.  Have any leaves, snow, or ice cleared regularly.  Use sand or salt on walking paths during winter.  Clean up any spills in your garage right away. This includes oil or grease spills. What can I do in the bathroom?  Use night lights.  Install grab bars by the toilet and in the tub and shower. Do not use towel bars as grab bars.  Use non-skid mats or decals in the tub or shower.  If you need to sit down in the shower, use a plastic, non-slip stool.  Keep the floor dry. Clean up any water that spills on the floor as soon as it happens.  Remove soap buildup in the tub or shower regularly.  Attach bath mats securely with double-sided non-slip rug  tape.  Do not have throw rugs and other things on the floor that can make you trip. What can I do in the bedroom?  Use night lights.  Make sure that you have a light by your bed that is easy to reach.  Do not use any sheets or blankets that are too big for your bed. They should not hang down onto the floor.  Have a firm chair that has side arms. You can use this for support while you get dressed.  Do not have throw rugs and other things on the floor that can make you trip. What can I do in the kitchen?  Clean up any spills right away.  Avoid walking on wet floors.  Keep items that you use a lot in easy-to-reach places.  If you need to reach something above you, use a strong step stool that has a grab bar.  Keep electrical cords out of the way.  Do not use floor polish or wax that makes floors slippery. If you must use wax, use non-skid floor wax.  Do not have throw rugs and other things on the floor that can make you trip. What can I do  with my stairs?  Do not leave any items on the stairs.  Make sure that there are handrails on both sides of the stairs and use them. Fix handrails that are broken or loose. Make sure that handrails are as long as the stairways.  Check any carpeting to make sure that it is firmly attached to the stairs. Fix any carpet that is loose or worn.  Avoid having throw rugs at the top or bottom of the stairs. If you do have throw rugs, attach them to the floor with carpet tape.  Make sure that you have a light switch at the top of the stairs and the bottom of the stairs. If you do not have them, ask someone to add them for you. What else can I do to help prevent falls?  Wear shoes that:  Do not have high heels.  Have rubber bottoms.  Are comfortable and fit you well.  Are closed at the toe. Do not wear sandals.  If you use a stepladder:  Make sure that it is fully opened. Do not climb a closed stepladder.  Make sure that both sides of the  stepladder are locked into place.  Ask someone to hold it for you, if possible.  Clearly mark and make sure that you can see:  Any grab bars or handrails.  First and last steps.  Where the edge of each step is.  Use tools that help you move around (mobility aids) if they are needed. These include:  Canes.  Walkers.  Scooters.  Crutches.  Turn on the lights when you go into a dark area. Replace any light bulbs as soon as they burn out.  Set up your furniture so you have a clear path. Avoid moving your furniture around.  If any of your floors are uneven, fix them.  If there are any pets around you, be aware of where they are.  Review your medicines with your doctor. Some medicines can make you feel dizzy. This can increase your chance of falling. Ask your doctor what other things that you can do to help prevent falls. This information is not intended to replace advice given to you by your health care provider. Make sure you discuss any questions you have with your health care provider. Document Released: 08/06/2009 Document Revised: 03/17/2016 Document Reviewed: 11/14/2014 Elsevier Interactive Patient Education  2017 [employer].

## 2017-07-21 NOTE — Patient Instructions (Addendum)
Continue current medications as ordered  Follow up with specialists as scheduled  Maintain low fat/low cholesterol meals  Smoking cessation discussed and highly urged  Follow up in 3 mos for hyperlipidemia, HTN, DM. Fasting labs prior to appt

## 2017-07-23 ENCOUNTER — Other Ambulatory Visit: Payer: Self-pay | Admitting: Cardiovascular Disease

## 2017-07-23 DIAGNOSIS — I10 Essential (primary) hypertension: Secondary | ICD-10-CM

## 2017-07-25 NOTE — Telephone Encounter (Signed)
Medication Detail    Disp Refills Start End   hydrALAZINE (APRESOLINE) 25 MG tablet 90 tablet 2 07/21/2017    Sig - Route: Take 1 tablet (25 mg total) by mouth 3 (three) times daily. - Oral   Sent to pharmacy as: hydrALAZINE (APRESOLINE) 25 MG tablet   E-Prescribing Status: Receipt confirmed by pharmacy (07/21/2017 2:25 PM EDT)   Associated Diagnoses   Essential hypertension     Pharmacy   [id number], [place] - [place]

## 2017-07-27 DIAGNOSIS — N184 Chronic kidney disease, stage 4 (severe): Secondary | ICD-10-CM | POA: Diagnosis not present

## 2017-07-27 DIAGNOSIS — Z720 Tobacco use: Secondary | ICD-10-CM | POA: Diagnosis not present

## 2017-07-27 DIAGNOSIS — E1129 Type 2 diabetes mellitus with other diabetic kidney complication: Secondary | ICD-10-CM | POA: Diagnosis not present

## 2017-07-27 DIAGNOSIS — D631 Anemia in chronic kidney disease: Secondary | ICD-10-CM | POA: Diagnosis not present

## 2017-07-27 DIAGNOSIS — N183 Chronic kidney disease, stage 3 (moderate): Secondary | ICD-10-CM | POA: Diagnosis not present

## 2017-07-31 ENCOUNTER — Ambulatory Visit (INDEPENDENT_AMBULATORY_CARE_PROVIDER_SITE_OTHER): Payer: Medicare Other | Admitting: Pharmacotherapy

## 2017-07-31 ENCOUNTER — Encounter: Payer: Self-pay | Admitting: Pharmacotherapy

## 2017-07-31 VITALS — BP 130/90 | HR 60 | Resp 12 | Ht 74.0 in | Wt 199.6 lb

## 2017-07-31 DIAGNOSIS — N183 Chronic kidney disease, stage 3 unspecified: Secondary | ICD-10-CM

## 2017-07-31 DIAGNOSIS — I10 Essential (primary) hypertension: Secondary | ICD-10-CM

## 2017-07-31 DIAGNOSIS — E1140 Type 2 diabetes mellitus with diabetic neuropathy, unspecified: Secondary | ICD-10-CM | POA: Diagnosis not present

## 2017-07-31 DIAGNOSIS — Z794 Long term (current) use of insulin: Secondary | ICD-10-CM

## 2017-07-31 NOTE — Patient Instructions (Signed)
Avoid Little Debbie and Hostess.  They are not your friends!  Keep up the walking for exercise.  Try to quit smoking

## 2017-07-31 NOTE — Progress Notes (Signed)
  Subjective:    Jacob [name] is a 65 y.o.African American male who presents for follow-up of Type 2 diabetes mellitus.   Last A1C: 7.0% (07/18/17) Has been working with Dr [name] regarding BP elevations.  Jacob [name] has taught him how to use his ambulatory BP cuff correctly. Currently on Lantus and Humalog.  Lantus 70 units dailly Humalog 12 units with each meal Logbook: 90-152 No hypoglycemia  Has been walking for exercise. Trying to follow healthy diet.  But portion sizes are too large. Denies problems with vision.  Eye exam last month. Denies problems with feet. Some peripheral edema. Nocturia once per night No dysuria Staying well hydrated.    Review of Systems A comprehensive review of systems was negative except for: Eyes: positive for contacts/glasses Cardiovascular: positive for lower extremity edema Genitourinary: positive for nocturia    Objective:    BP 130/90   Pulse 60   Resp 12   Ht 6' 2"$  (1.88 m)   Wt 199 lb 9.6 oz (90.5 kg)   SpO2 97%   BMI 25.63 kg/m   General:  alert, cooperative and no distress  Oropharynx: normal findings: lips normal without lesions and gums healthy   Eyes:  negative findings: lids and lashes normal and conjunctivae and sclerae normal   Ears:  external ears normal        Lung: clear to auscultation bilaterally  Heart:  regular rate and rhythm     Extremities: edema trace in bilateral extremities  Skin: warm and dry, no hyperpigmentation, vitiligo, or suspicious lesions     Neuro: mental status, speech normal, alert and oriented x3 and gait and station normal   Lab Review Glucose, Bld (mg/dL)  Date Value  07/18/2017 73  06/27/2017 137 (H)  03/15/2017 53 (L)   CO2 (mmol/L)  Date Value  07/18/2017 24  06/27/2017 15 (L)  03/15/2017 23   BUN (mg/dL)  Date Value  07/18/2017 44 (H)  06/27/2017 59 (H)  03/15/2017 35 (H)  02/08/2017 55 (H)  01/30/2017 49 (H)  01/26/2017 56 (H)   Creat (mg/dL)  Date Value   07/18/2017 2.79 (H)  06/27/2017 3.16 (H)  03/15/2017 2.37 (H)       Assessment:    Diabetes Mellitus type II, under good control.  A1C at goal <7% BP close to goal <130/80 Renal function stable.   Plan:    1.  Rx changes: none  2.  Continue Lantus 70 units daily. 3.  Continue Humalog 12 units with each meal. 4.  Counseled on nutrition goals.  Needs healthy snack options. 5.  Praised exercise efforts - goal is 30-45 minutes 5 x week. 6.  BP close to goal <130/80 today.  Will continue current RX and monitor. 7.  Renal function is stable.

## 2017-08-07 ENCOUNTER — Telehealth: Payer: Self-pay | Admitting: *Deleted

## 2017-08-07 NOTE — Telephone Encounter (Signed)
Patient called c/o cough and congestion (no fever). He is requesting an appointment with Dr. [name]. He does have a PCP, but their phones are not working. I tried to call also. Appointment scheduled for tomorrow at 9:30.

## 2017-08-08 ENCOUNTER — Ambulatory Visit: Payer: Medicare Other | Admitting: Internal Medicine

## 2017-08-09 ENCOUNTER — Encounter: Payer: Self-pay | Admitting: Nurse Practitioner

## 2017-08-09 ENCOUNTER — Ambulatory Visit (INDEPENDENT_AMBULATORY_CARE_PROVIDER_SITE_OTHER): Payer: Medicare Other | Admitting: Nurse Practitioner

## 2017-08-09 VITALS — BP 160/82 | HR 60 | Temp 98.2°F | Resp 18 | Ht 74.0 in | Wt 203.0 lb

## 2017-08-09 DIAGNOSIS — J019 Acute sinusitis, unspecified: Secondary | ICD-10-CM

## 2017-08-09 MED ORDER — DM-GUAIFENESIN ER 30-600 MG PO TB12: 2.0000 | ORAL_TABLET | Freq: Two times a day (BID) | ORAL | 0 refills | Status: DC

## 2017-08-09 NOTE — Patient Instructions (Addendum)
You have a viral infection causing your sinus congestion.  Start taking mucenix DM 1-2 tablet twice a day for 7 days to help reduce congestion and cough   Use nettie pot to clear secretions in sinuses. Rinse following instructions on nettie pot. You may get this at the local drug store. You may use a nasal spray to flush out secretions if unable to tolerate Nettie pot.    Sinusitis, Adult Sinusitis is soreness and inflammation of your sinuses. Sinuses are hollow spaces in the bones around your face. Your sinuses are located:  Around your eyes.  In the middle of your forehead.  Behind your nose.  In your cheekbones.  Your sinuses and nasal passages are lined with a stringy fluid (mucus). Mucus normally drains out of your sinuses. When your nasal tissues become inflamed or swollen, the mucus can become trapped or blocked so air cannot flow through your sinuses. This allows bacteria, viruses, and funguses to grow, which leads to infection. Sinusitis can develop quickly and last for 7?10 days (acute) or for more than 12 weeks (chronic). Sinusitis often develops after a cold. What are the causes? This condition is caused by anything that creates swelling in the sinuses or stops mucus from draining, including:  Allergies.  Asthma.  Bacterial or viral infection.  Abnormally shaped bones between the nasal passages.  Nasal growths that contain mucus (nasal polyps).  Narrow sinus openings.  Pollutants, such as chemicals or irritants in the air.  A foreign object stuck in the nose.  A fungal infection. This is rare.  What increases the risk? The following factors may make you more likely to develop this condition:  Having allergies or asthma.  Having had a recent cold or respiratory tract infection.  Having structural deformities or blockages in your nose or sinuses.  Having a weak immune system.  Doing a lot of swimming or diving.  Overusing nasal  sprays.  Smoking.  What are the signs or symptoms? The main symptoms of this condition are pain and a feeling of pressure around the affected sinuses. Other symptoms include:  Upper toothache.  Earache.  Headache.  Bad breath.  Decreased sense of smell and taste.  A cough that may get worse at night.  Fatigue.  Fever.  Thick drainage from your nose. The drainage is often green and it may contain pus (purulent).  Stuffy nose or congestion.  Postnasal drip. This is when extra mucus collects in the throat or back of the nose.  Swelling and warmth over the affected sinuses.  Sore throat.  Sensitivity to light.  How is this diagnosed? This condition is diagnosed based on symptoms, a medical history, and a physical exam. To find out if your condition is acute or chronic, your health care provider may:  Look in your nose for signs of nasal polyps.  Tap over the affected sinus to check for signs of infection.  View the inside of your sinuses using an imaging device that has a light attached (endoscope).  If your health care provider suspects that you have chronic sinusitis, you may also:  Be tested for allergies.  Have a sample of mucus taken from your nose (nasal culture) and checked for bacteria.  Have a mucus sample examined to see if your sinusitis is related to an allergy.  If your sinusitis does not respond to treatment and it lasts longer than 8 weeks, you may have an MRI or CT scan to check your sinuses. These scans also help  to determine how severe your infection is. In rare cases, a bone biopsy may be done to rule out more serious types of fungal sinus disease. How is this treated? Treatment for sinusitis depends on the cause and whether your condition is chronic or acute. If a virus is causing your sinusitis, your symptoms will go away on their own within 10 days. You may be given medicines to relieve your symptoms, including:  Topical nasal decongestants.  They shrink swollen nasal passages and let mucus drain from your sinuses.  Antihistamines. These drugs block inflammation that is triggered by allergies. This can help to ease swelling in your nose and sinuses.  Topical nasal corticosteroids. These are nasal sprays that ease inflammation and swelling in your nose and sinuses.  Nasal saline washes. These rinses can help to get rid of thick mucus in your nose.  If your condition is caused by bacteria, you will be given an antibiotic medicine. If your condition is caused by a fungus, you will be given an antifungal medicine. Surgery may be needed to correct underlying conditions, such as narrow nasal passages. Surgery may also be needed to remove polyps. Follow these instructions at home: Medicines  Take, use, or apply over-the-counter and prescription medicines only as told by your health care provider. These may include nasal sprays.  If you were prescribed an antibiotic medicine, take it as told by your health care provider. Do not stop taking the antibiotic even if you start to feel better. Hydrate and Humidify  Drink enough water to keep your urine clear or pale yellow. Staying hydrated will help to thin your mucus.  Use a cool mist humidifier to keep the humidity level in your home above 50%.  Inhale steam for 10-15 minutes, 3-4 times a day or as told by your health care provider. You can do this in the bathroom while a hot shower is running.  Limit your exposure to cool or dry air. Rest  Rest as much as possible.  Sleep with your head raised (elevated).  Make sure to get enough sleep each night. General instructions  Apply a warm, moist washcloth to your face 3-4 times a day or as told by your health care provider. This will help with discomfort.  Wash your hands often with soap and water to reduce your exposure to viruses and other germs. If soap and water are not available, use hand sanitizer.  Do not smoke. Avoid being  around people who are smoking (secondhand smoke).  Keep all follow-up visits as told by your health care provider. This is important. Contact a health care provider if:  You have a fever.  Your symptoms get worse.  Your symptoms do not improve within 10 days. Get help right away if:  You have a severe headache.  You have persistent vomiting.  You have pain or swelling around your face or eyes.  You have vision problems.  You develop confusion.  Your neck is stiff.  You have trouble breathing. This information is not intended to replace advice given to you by your health care provider. Make sure you discuss any questions you have with your health care provider. Document Released: 10/10/2005 Document Revised: 06/05/2016 Document Reviewed: 08/05/2015 Elsevier Interactive Patient Education  2017 [employer].

## 2017-08-09 NOTE — Progress Notes (Signed)
Careteam: Patient Care Team: Gildardo Cranker, DO as PCP - General (Internal Medicine) [name], MD as PCP - Infectious Diseases (Infectious Diseases) [name], MD as Consulting Physician (Ophthalmology) [name], MD as Consulting Physician (Cardiology) [name], MD as Consulting Physician (Infectious Diseases) Gardiner Barefoot, DPM as Consulting Physician (Podiatry)  Advanced Directive information Does Patient Have a Medical Advance Directive?: Yes, Type of Advance Directive: Living will  Allergies  Allergen Reactions  . Amphetamines Other (See Comments)    Pt is unaware of this -     Chief Complaint  Patient presents with  . Acute Visit    Pt is being seen for cough, congestion x several days.      HPI: Patient is a 66 y.o. male seen in the office today complaining of cold symptoms. He did complain of ear pressure that started Sunday but went away Monday. He has sinus congestion and drainage. He says the drainage was brown to yellow. He says he took an Amoxicillin 875/125 mg last night and the drainage became clear. He has not taken anymore. He says the pills were left by a former roommate. He also complains of cough that started last Sunday, 08/06/17. He has used cough drops for cough with very little relief. He says the Amoxicillin helped his cough last night as well. He also complains of light sweats when he is lying down.  Denies fevers, chills, nausea/vomiting, diarrhea.   Review of Systems:  Review of Systems  Constitutional: Positive for diaphoresis (Sweats at night when under his blancket.). Negative for chills, fever and malaise/fatigue.  HENT: Positive for congestion and sinus pain. Negative for hearing loss and sore throat.   Respiratory: Positive for cough. Negative for sputum production, shortness of breath and wheezing.   Cardiovascular: Negative for chest pain and palpitations.  Genitourinary: Negative for dysuria and frequency.    Musculoskeletal: Positive for back pain (lower back pain).  Skin: Negative for rash.  Neurological: Negative for dizziness and headaches.  Psychiatric/Behavioral: Negative for depression and suicidal ideas.    Past Medical History:  Diagnosis Date  . Arthritis   . Cataracts, bilateral    immature  . CHF (congestive heart failure) (HCC)    not on any meds  . Chronic back pain    stenosis  . Chronic kidney disease, unspecified   . Coronary atherosclerosis of artery bypass graft   . Coronary atherosclerosis of native coronary artery 2005   s/p surgery  . Drug abuse ([place])    hx; tested for cocaine as recently as 2/08. says he is not using drugs now - avoided defib. for this reason   . GERD (gastroesophageal reflux disease)    takes OTC meds as needed  . Glaucoma    uses eye drops daily  . Gout, unspecified    takes Allopurinol daily as well as Colchicine-if needed  . Hepatitis 1967   Hep C  . History of colon polyps    benign  . HTN (hypertension)    takes Coreg,Imdur.and Apresoline daily  . Human immunodeficiency virus (HIV) disease ([place])    takes Genvoya daily  . Hyperlipidemia    takes Atorvastatin daily  . Ischemic cardiomyopathy   . Muscle spasm    takes Zanaflex as needed  . Nocturia   . Peripheral neuropathy    takes gabapentin daily  . Pneumonia    hx of  . Shortness of breath dyspnea    rarely but if notices it then with exertion  .  Syphilis, unspecified   . Type II or unspecified type diabetes mellitus without mention of complication, not stated as uncontrolled 2004   Lantus daily.Average fasting blood sugar 125-199   Past Surgical History:  Procedure Laterality Date  . COLONOSCOPY  2013   Dr.John Henrene Pastor   . CORONARY ARTERY BYPASS GRAFT    . laproscopic cholecystectomy  8/07  . left intertrochanteric hip fracture  2004   s/p intermedullary nail placement 2/08  . left transverse mandibular fracture  10/05  . LUMBAR LAMINECTOMY/DECOMPRESSION  MICRODISCECTOMY N/A 02/29/2016   Procedure: Left L4-5 Lateral Recess Decompression, Removal Extradural Intraspinal Facet Cyst;  Surgeon: Marybelle Killings, MD;  Location: [facility];  Service: Orthopedics;  Laterality: N/A;   Social History:   reports that he has been smoking Cigarettes.  He has a 20.50 pack-year smoking history. He has never used smokeless tobacco. He reports that he drinks about 3.6 oz of alcohol per week . He reports that he does not use drugs.  Family History  Problem Relation Age of Onset  . Heart failure Father   . Hypertension Father   . Diabetes Brother   . Heart attack Brother   . Alzheimer's disease Mother   . Stroke Sister   . Diabetes Sister   . Alzheimer's disease Sister   . Hypertension Brother   . Diabetes Brother   . Drug abuse Brother   . Colon cancer Neg Hx     Medications: Patient's Medications  New Prescriptions   No medications on file  Previous Medications   ALLOPURINOL (ZYLOPRIM) 100 MG TABLET    take 1 tablet by mouth once daily   ANORO ELLIPTA 62.5-25 MCG/INH AEPB    INHALE 1 PUFF INTO THE LUNGS DAILY   ASPIRIN EC 81 MG TABLET    Take 81 mg by mouth daily.   ATORVASTATIN (LIPITOR) 20 MG TABLET    take 1 tablet by mouth once daily   B-D UF III MINI PEN NEEDLES 31G X 5 MM MISC    use four times a day   CARVEDILOL (COREG) 25 MG TABLET    take 1 tablet by mouth twice a day with meals   CHOLINE FENOFIBRATE (FENOFIBRIC ACID) 135 MG CPDR    take 1 capsule by mouth once daily   COLCHICINE 0.6 MG TABLET    take 1 tablet by mouth CONTINUOUS AS NEEDED FOR GOUT   ELVITEGRAVIR-COBICISTAT-EMTRICITABINE-TENOFOVIR (GENVOYA) 150-150-200-10 MG TABS TABLET    TAKE 1 TABLET BY MOUTH DAILY WITH BREAKFAST.   FUROSEMIDE (LASIX) 40 MG TABLET    Take 40 mg by mouth daily.   GABAPENTIN (NEURONTIN) 300 MG CAPSULE    take 1 capsule by mouth three times a day   GENVOYA 150-150-200-10 MG TABS TABLET    TAKE 1 TABLET BY MOUTH DAILY WITH BREAKFAST.   GLUCOSAMINE-CHONDROIT-VIT  C-MN (GLUCOSAMINE CHONDR 1500 COMPLX PO)    Take 2 tablets by mouth daily. Reported on 04/04/2016   GLUCOSE BLOOD (ONE TOUCH ULTRA TEST) TEST STRIP    Use to test blood sugar twice daily. Dx:E11.40   HUMALOG KWIKPEN 100 UNIT/ML KIWKPEN    INJECT INTO SKIN 12 UNITS SUBCUTANEOUSLY 3 TIMES DAILY BEFORE MEALS   HYDRALAZINE (APRESOLINE) 25 MG TABLET    Take 1 tablet (25 mg total) by mouth 3 (three) times daily.   ISOSORBIDE MONONITRATE (IMDUR) 30 MG 24 HR TABLET    take 1 tablet by mouth once daily   LANTUS SOLOSTAR 100 UNIT/ML SOLOSTAR PEN    inject  70 units subcutaneously every morning (35 UNITS ON EACH SIDE)   LATANOPROST (XALATAN) 0.005 % OPHTHALMIC SOLUTION    Place 1 drop into both eyes at bedtime. Reported on 04/04/2016   NAFTIFINE HCL 2 % CREA    apply to affected area twice a day   NITROGLYCERIN (NITROSTAT) 0.3 MG SL TABLET    Place 1 tablet (0.3 mg total) under the tongue as needed for chest pain (not to exceed 3 tablets in one day.).   ONETOUCH DELICA LANCETS 82N MISC    CHECK BLOOD SUGAR TWO TIMES DAILY  Modified Medications   No medications on file  Discontinued Medications   FUROSEMIDE (LASIX) 40 MG TABLET    Take 1 tablet (40 mg total) by mouth daily.     Physical Exam:  Vitals:   08/09/17 1521  BP: (!) 160/82  Pulse: 60  Resp: 18  Temp: 98.2 F (36.8 C)  TempSrc: Oral  SpO2: 98%  Weight: 203 lb (92.1 kg)  Height: 6\' 2"  (1.88 m)   Body mass index is 26.06 kg/m.  Physical Exam  Constitutional: He appears well-developed and well-nourished. No distress.  HENT:  Head: Normocephalic and atraumatic.  Right Ear: External ear normal.  Left Ear: External ear normal.  Nose: Rhinorrhea present.  Mouth/Throat: Oropharynx is clear and moist. No oropharyngeal exudate.  Eyes: Pupils are equal, round, and reactive to light. Conjunctivae and EOM are normal. Right eye exhibits no discharge. Left eye exhibits no discharge.  Neck: Normal range of motion. Neck supple.  Cardiovascular:  Normal rate, regular rhythm, normal heart sounds and intact distal pulses.   Pulmonary/Chest: Effort normal and breath sounds normal. No respiratory distress. He has no wheezes.  Lymphadenopathy:    He has no cervical adenopathy.  Skin: Skin is warm and dry. Capillary refill takes 2 to 3 seconds. He is not diaphoretic.  Psychiatric: He has a normal mood and affect.    Labs reviewed: Basic Metabolic Panel:  Recent Labs  11/10/16 0757  03/15/17 0924 06/27/17 1028 07/18/17 0828  NA  --   < > 141 137 139  K  --   < > 4.5 5.5* 5.5*  CL  --   < > 112* 110 111*  CO2  --   < > 23 15* 24  GLUCOSE  --   < > 53* 137* 73  BUN  --   < > 35* 59* 44*  CREATININE  --   < > 2.37* 3.16* 2.79*  CALCIUM  --   < > 8.8 8.7 9.5  TSH 0.540  --   --   --  0.81  < > = values in this interval not displayed. Liver Function Tests:  Recent Labs  11/11/16 0616 03/15/17 0924 06/27/17 1028 07/18/17 0828  AST 24 25 24 22   ALT 24 20 19 16   ALKPHOS 65 65 54  --   BILITOT 0.5 0.3 0.3 0.4  PROT 7.2 7.5 7.4 7.8  ALBUMIN 2.9* 3.6 3.6  --     Recent Labs  10/23/16 0007  LIPASE 52*   No results for input(s): AMMONIA in the last 8760 hours. CBC:  Recent Labs  11/10/16 0457 11/11/16 0616 11/18/16 1154 06/27/17 1028  WBC 8.1 5.5 5.7 5.1  NEUTROABS 4.5 1.8 1,881  --   HGB 10.0* 9.7* 10.2* 14.2  HCT 31.6* 30.2* 33.1* 42.0  MCV 91.3 89.3 90.7 95.5  PLT 313 276 304 179   Lipid Panel:  Recent Labs  08/18/16 0850 03/15/17  1155 07/18/17 0828  CHOL 162 178 177  HDL 39* 30* 34*  LDLCALC 90 100*  --   TRIG 165* 239* 295*  CHOLHDL 4.2 5.9* 5.2*   TSH:  Recent Labs  11/10/16 0757 07/18/17 0828  TSH 0.540 0.81   A1C: Lab Results  Component Value Date   HGBA1C 7.0 (H) 07/18/2017     Assessment/Plan 1. Acute non-recurrent sinusitis, unspecified location Most likely viral, educated on use of antibiotics  -supportive care at this time -nasal saline as needed throughout the  day Start taking mucenix DM 1 tablet twice a day for 7 days to help reduce cough and congestion  - dextromethorphan-guaiFENesin (MUCINEX DM) 30-600 MG 12hr tablet; Take 2 tablets by mouth 2 (two) times daily.  Dispense: 60 tablet; Refill: 0 -return precautions discussed   Jashira Cotugno K. Harle Battiest  [facility] [phone] 8 am - 5 pm) [phone] (after hours)

## 2017-08-14 ENCOUNTER — Telehealth: Payer: Self-pay | Admitting: *Deleted

## 2017-08-14 ENCOUNTER — Other Ambulatory Visit: Payer: Self-pay | Admitting: Nurse Practitioner

## 2017-08-14 MED ORDER — AMOXICILLIN-POT CLAVULANATE 875-125 MG PO TABS: 1.0000 | ORAL_TABLET | Freq: Two times a day (BID) | ORAL | 0 refills | Status: DC

## 2017-08-14 NOTE — Telephone Encounter (Signed)
Patient called and stated that he saw you last week for Sinus Infection and was told to take Mucinex and Nasal Spray. Patient stated that this is not helping him. Patient is still stopped up and feels worse. Patient stated that he doesn't know if you know that he has HIV and symptoms are worse. Congestion is brownish/Yellowish. Sweats at night but no temperature. Patient is requesting an antibiotic. Please Advise.   Pharmacy: [facility] on [address]

## 2017-08-14 NOTE — Telephone Encounter (Signed)
Prescription sent to pharmacy.

## 2017-08-14 NOTE — Telephone Encounter (Signed)
Patient notified and agreed.  

## 2017-08-15 ENCOUNTER — Other Ambulatory Visit: Payer: Self-pay | Admitting: *Deleted

## 2017-08-15 MED ORDER — ONETOUCH ULTRA MINI W/DEVICE KIT: 1.0000 | PACK | Freq: Two times a day (BID) | 0 refills | Status: DC

## 2017-08-15 MED ORDER — GLUCOSE BLOOD VI STRP: ORAL_STRIP | 3 refills | Status: DC

## 2017-08-15 NOTE — Telephone Encounter (Signed)
[facility]. Patient requested new BS Machine. Faxed to [facility]

## 2017-08-22 ENCOUNTER — Telehealth: Payer: Self-pay | Admitting: *Deleted

## 2017-08-22 NOTE — Telephone Encounter (Signed)
Okay thank you

## 2017-08-22 NOTE — Telephone Encounter (Signed)
This can take time, cont to use the nasal saline which will help nasal congestion, can use this as much as he needs as long as it is plain nasal saline. Does he have chest congestion as well or just nasal?

## 2017-08-22 NOTE — Telephone Encounter (Signed)
Patient called and stated that his Nasal Congestion is no better. Stated that he has taken the Antibiotic and Mucinex and no better. Stated that he just finished antibiotic yesterday. Stated that he is still so congested. No fever. When he cough up congestion its brownish in color. Stated that he has been drinking a lot of water.  Please Advise.

## 2017-08-22 NOTE — Telephone Encounter (Signed)
Patient notified and agreed. Patient stated that right now its just Nasal Congestion. Patient will call back in a couple of days if no better.

## 2017-08-23 ENCOUNTER — Other Ambulatory Visit: Payer: Self-pay | Admitting: Internal Medicine

## 2017-08-24 ENCOUNTER — Telehealth: Payer: Self-pay

## 2017-08-24 NOTE — Telephone Encounter (Signed)
Patient called yesterday after 4:30 pm to state that he is not doing any better and has more chest congestion vs nasal congestion. Patient states medication is not working.   I spoke with patient this morning to offer appointment to follow-up with [name] and patient stated" I feel a little better this morning, cancel message that I left yesterday."  Patient will call back to schedule acute appointment with Dr.Carter if needed.

## 2017-08-28 ENCOUNTER — Telehealth: Payer: Self-pay

## 2017-08-28 NOTE — Telephone Encounter (Signed)
Needs to come in for evaluation, can also be evaluated by ID if he would like to go that route.

## 2017-08-28 NOTE — Telephone Encounter (Signed)
Patient is "not any better". Still coughing brown/clear phlegm, depends on how hard he coughs, no fever. Has finished medication. Does not want to come in, just wants [name] to Rx something else. Told him he would need to come in, " I want to know what [name] has to say". He has an appt with Dr. [name] in December. Should he call his HIV doctor? Will forward message to [place].

## 2017-08-29 NOTE — Telephone Encounter (Signed)
Okay thank you for offering him an appt. If he changes his mind we are more than happy to see him.

## 2017-08-29 NOTE — Telephone Encounter (Signed)
Spoke with [name], patient aware he needs to be seen. Patient refused appointment, states " I never would like to see a nurse practioner again." Patient informed that Dr.Carter has 2 openings today and 2 openings tomorrow, patient still refused.  Patient states " I will wait to see Dr.Carter in December, I am using this ultra tough cough medicine and nasal spray."  I again tried to offer appointment for patient has called multiple times stating he is no better, patient again refused appointment at this time.

## 2017-09-13 ENCOUNTER — Ambulatory Visit (INDEPENDENT_AMBULATORY_CARE_PROVIDER_SITE_OTHER): Payer: Medicare Other | Admitting: Podiatry

## 2017-09-13 ENCOUNTER — Encounter: Payer: Self-pay | Admitting: Podiatry

## 2017-09-13 DIAGNOSIS — E0842 Diabetes mellitus due to underlying condition with diabetic polyneuropathy: Secondary | ICD-10-CM

## 2017-09-13 DIAGNOSIS — I70235 Atherosclerosis of native arteries of right leg with ulceration of other part of foot: Secondary | ICD-10-CM

## 2017-09-13 DIAGNOSIS — B351 Tinea unguium: Secondary | ICD-10-CM

## 2017-09-13 DIAGNOSIS — L989 Disorder of the skin and subcutaneous tissue, unspecified: Secondary | ICD-10-CM | POA: Diagnosis not present

## 2017-09-13 DIAGNOSIS — M79676 Pain in unspecified toe(s): Secondary | ICD-10-CM

## 2017-09-19 NOTE — Progress Notes (Signed)
Subjective: Patient is a 66 y.o. male with PMHx of T2DM presenting to the office today for follow up evaluation of pre-ulcerative callus lesions to the bilateral feet. He reports some pain to the areas. He has not done anything to treat them at home.  Patient also complains of elongated, thickened nails that cause pain while ambulating in shoes. Patient is unable to trim their own nails. Patient presents today for further treatment and evaluation.   Past Medical History:  Diagnosis Date  . Arthritis   . Cataracts, bilateral    immature  . CHF (congestive heart failure) (HCC)    not on any meds  . Chronic back pain    stenosis  . Chronic kidney disease, unspecified   . Coronary atherosclerosis of artery bypass graft   . Coronary atherosclerosis of native coronary artery 2005   s/p surgery  . Drug abuse ([place])    hx; tested for cocaine as recently as 2/08. says he is not using drugs now - avoided defib. for this reason   . GERD (gastroesophageal reflux disease)    takes OTC meds as needed  . Glaucoma    uses eye drops daily  . Gout, unspecified    takes Allopurinol daily as well as Colchicine-if needed  . Hepatitis 1967   Hep C  . History of colon polyps    benign  . HTN (hypertension)    takes Coreg,Imdur.and Apresoline daily  . Human immunodeficiency virus (HIV) disease ([place])    takes Genvoya daily  . Hyperlipidemia    takes Atorvastatin daily  . Ischemic cardiomyopathy   . Muscle spasm    takes Zanaflex as needed  . Nocturia   . Peripheral neuropathy    takes gabapentin daily  . Pneumonia    hx of  . Shortness of breath dyspnea    rarely but if notices it then with exertion  . Syphilis, unspecified   . Type II or unspecified type diabetes mellitus without mention of complication, not stated as uncontrolled 2004   Lantus daily.Average fasting blood sugar 125-199    Objective:  Physical Exam General: Alert and oriented x3 in no acute distress  Dermatology:  Hyperkeratotic lesion present on the bilateral feet x 5. Pain on palpation with a central nucleated core noted. Skin is warm, dry and supple bilateral lower extremities. Negative for open lesions or macerations. Nails are tender, long, thickened and dystrophic with subungual debris, consistent with onychomycosis, 1-5 bilateral. No signs of infection noted.  Vascular: Palpable pedal pulses bilaterally. No edema or erythema noted. Capillary refill within normal limits.  Neurological: Epicritic and protective threshold grossly intact bilaterally.   Musculoskeletal Exam: Pain on palpation at the keratotic lesion noted. Range of motion within normal limits bilateral. Muscle strength 5/5 in all groups bilateral.  Assessment: 1. Onychodystrophic nails 1-5 bilateral with hyperkeratosis of nails.  2. Onychomycosis of nail due to dermatophyte bilateral 3. Pre-ulcerative callus lesions to the bilateral feet x 5   Plan of Care:  #1 Patient evaluated. #2 Excisional debridement of keratoic lesions using a chisel blade was performed without incident.  #3 Dressed with light dressing. #4 Mechanical debridement of nails 1-5 bilaterally performed using a nail nipper. Filed with dremel without incident.  #5 Patient is to return to the clinic in 3 months.   Edrick Kins, DPM [facility]  Dr. Edrick Kins, DPM    [address]  [place], [place] [address]                Office [phone]  Fax [phone]   

## 2017-09-20 ENCOUNTER — Other Ambulatory Visit: Payer: Self-pay | Admitting: Internal Medicine

## 2017-09-20 DIAGNOSIS — E781 Pure hyperglyceridemia: Secondary | ICD-10-CM

## 2017-09-29 ENCOUNTER — Other Ambulatory Visit: Payer: Self-pay | Admitting: Internal Medicine

## 2017-09-29 DIAGNOSIS — B20 Human immunodeficiency virus [HIV] disease: Secondary | ICD-10-CM

## 2017-10-03 ENCOUNTER — Telehealth: Payer: Self-pay | Admitting: *Deleted

## 2017-10-03 NOTE — Telephone Encounter (Signed)
Patient requesting refill of genvoya. Last office note mentioned this may be chagned due to labs. Please advise if ok to refill or if his regimen will change. Landis Gandy, RN

## 2017-10-03 NOTE — Telephone Encounter (Signed)
It is okay to go ahead and refill Genvoya.

## 2017-10-04 ENCOUNTER — Other Ambulatory Visit: Payer: Self-pay | Admitting: Internal Medicine

## 2017-10-04 DIAGNOSIS — B20 Human immunodeficiency virus [HIV] disease: Secondary | ICD-10-CM

## 2017-10-09 ENCOUNTER — Other Ambulatory Visit: Payer: Medicare Other

## 2017-10-09 DIAGNOSIS — E782 Mixed hyperlipidemia: Secondary | ICD-10-CM | POA: Diagnosis not present

## 2017-10-09 DIAGNOSIS — E1140 Type 2 diabetes mellitus with diabetic neuropathy, unspecified: Secondary | ICD-10-CM

## 2017-10-09 DIAGNOSIS — Z794 Long term (current) use of insulin: Secondary | ICD-10-CM | POA: Diagnosis not present

## 2017-10-10 LAB — HEMOGLOBIN A1C
Hgb A1c MFr Bld: 6.3 % of total Hgb — ABNORMAL HIGH (ref <5.7)
Mean Plasma Glucose: 134 (calc)
eAG (mmol/L): 7.4 (calc)

## 2017-10-10 LAB — MICROALBUMIN / CREATININE URINE RATIO
CREATININE, URINE: 119 mg/dL (ref 20–320)
MICROALB UR: 230.5 mg/dL
MICROALB/CREAT RATIO: 1937 ug/mg{creat} — AB (ref <30)

## 2017-10-10 LAB — LIPID PANEL
Cholesterol: 211 mg/dL — ABNORMAL HIGH (ref <200)
HDL: 39 mg/dL — ABNORMAL LOW (ref >40)
LDL Cholesterol (Calc): 130 mg/dL (calc) — ABNORMAL HIGH
NON-HDL CHOLESTEROL (CALC): 172 mg/dL — AB (ref <130)
Total CHOL/HDL Ratio: 5.4 (calc) — ABNORMAL HIGH (ref <5.0)
Triglycerides: 297 mg/dL — ABNORMAL HIGH (ref <150)

## 2017-10-11 ENCOUNTER — Ambulatory Visit (INDEPENDENT_AMBULATORY_CARE_PROVIDER_SITE_OTHER): Payer: Medicare Other | Admitting: Internal Medicine

## 2017-10-11 ENCOUNTER — Encounter: Payer: Self-pay | Admitting: Internal Medicine

## 2017-10-11 VITALS — BP 140/94 | HR 62 | Temp 98.0°F | Ht 74.0 in | Wt 200.6 lb

## 2017-10-11 DIAGNOSIS — B20 Human immunodeficiency virus [HIV] disease: Secondary | ICD-10-CM | POA: Diagnosis not present

## 2017-10-11 DIAGNOSIS — L84 Corns and callosities: Secondary | ICD-10-CM | POA: Diagnosis not present

## 2017-10-11 DIAGNOSIS — E11628 Type 2 diabetes mellitus with other skin complications: Secondary | ICD-10-CM | POA: Diagnosis not present

## 2017-10-11 DIAGNOSIS — Z794 Long term (current) use of insulin: Secondary | ICD-10-CM

## 2017-10-11 DIAGNOSIS — Z720 Tobacco use: Secondary | ICD-10-CM | POA: Diagnosis not present

## 2017-10-11 DIAGNOSIS — N183 Chronic kidney disease, stage 3 unspecified: Secondary | ICD-10-CM

## 2017-10-11 DIAGNOSIS — I10 Essential (primary) hypertension: Secondary | ICD-10-CM

## 2017-10-11 DIAGNOSIS — E782 Mixed hyperlipidemia: Secondary | ICD-10-CM

## 2017-10-11 MED ORDER — ALLOPURINOL 100 MG PO TABS: 100.0000 mg | ORAL_TABLET | Freq: Every day | ORAL | 5 refills | Status: DC

## 2017-10-11 NOTE — Patient Instructions (Addendum)
Continue current medications as ordered  Diabetic shoe form completed  Follow up with specialists as scheduled  Smoking cessation discussed and highly urged  Follow up in 3 mos for hyperlipidemia, DM, tobacco abuse. Fasting labs prior to appt

## 2017-10-11 NOTE — Progress Notes (Signed)
[name], male   DOB: 18-Jun-1951, 66 y.o.   MRN: [id number]   Location:  [facility] OFFICE  Provider: DR Arletha Grippe  Code Status: FULL CODE Goals of Care:  Advanced Directives 08/09/2017  Does Patient Have a Medical Advance Directive? Yes  Type of Advance Directive Living will  Does patient want to make changes to medical advance directive? -  Copy of [place] in Chart? -  Would patient like information on creating a medical advance directive? -     Chief Complaint  Patient presents with  . Medical Management of Chronic Issues    3 Month Routine Visit- Hyperlipidemia, HTN, DM    HPI: Patient is a 66 y.o. male seen today for medical management of chronic diseases.  Urine microalbuminuria is worsening off ARB.  Tob abuse - still smoking but down to < 1/2 ppd.    HIV - stable on HAART tx. HIV1 RNA quant <20; log <1.3. CD4 abs 970. Followed by ID Dr [name] annually  HTN/CHF - stable on hydralazine, coreg, imdur. Off ARB since previous hospital d/c. Followed by Dr [name]   DM - controlled. He takes 1/2 dose lantus on each side of abdomen.  No low BS reactions. Numbness/tingling in feet occasionally. Takes Lantus  insulin 70 units qhs and humalog 12 units qAC . Followed by podiatry. No ulcerations but has calluses. A1c 6.3%. He is not on ARB due to AKI and hyperkalemia. He has an eye specialist. He has diabetic preulcerative calluses and bunions and is followed by podiatry. Urine microalbumin/Cr ratio 1937 (prev 1142.6).  Hyperlipidemia - borderline controlled. Takes lipitor. LDL 104; TG 295; HDL 34  CKD  - borderline stage 3. Cr 2.79. Followed by nephrology Dr [name]  Past Medical History:  Diagnosis Date  . Arthritis   . Cataracts, bilateral    immature  . CHF (congestive heart failure) (HCC)    not on any meds  . Chronic back pain    stenosis  . Chronic kidney disease, unspecified   . Coronary atherosclerosis of artery bypass graft   .  Coronary atherosclerosis of native coronary artery 2005   s/p surgery  . Drug abuse ([place])    hx; tested for cocaine as recently as 2/08. says he is not using drugs now - avoided defib. for this reason   . GERD (gastroesophageal reflux disease)    takes OTC meds as needed  . Glaucoma    uses eye drops daily  . Gout, unspecified    takes Allopurinol daily as well as Colchicine-if needed  . Hepatitis 1967   Hep C  . History of colon polyps    benign  . HTN (hypertension)    takes Coreg,Imdur.and Apresoline daily  . Human immunodeficiency virus (HIV) disease ([place])    takes Genvoya daily  . Hyperlipidemia    takes Atorvastatin daily  . Ischemic cardiomyopathy   . Muscle spasm    takes Zanaflex as needed  . Nocturia   . Peripheral neuropathy    takes gabapentin daily  . Pneumonia    hx of  . Shortness of breath dyspnea    rarely but if notices it then with exertion  . Syphilis, unspecified   . Type II or unspecified type diabetes mellitus without mention of complication, not stated as uncontrolled 2004   Lantus daily.Average fasting blood sugar 125-199    Past Surgical History:  Procedure Laterality Date  . COLONOSCOPY  2013   Dr.John  Henrene Pastor   . CORONARY ARTERY BYPASS GRAFT    . laproscopic cholecystectomy  8/07  . left intertrochanteric hip fracture  2004   s/p intermedullary nail placement 2/08  . left transverse mandibular fracture  10/05  . LUMBAR LAMINECTOMY/DECOMPRESSION MICRODISCECTOMY N/A 02/29/2016   Procedure: Left L4-5 Lateral Recess Decompression, Removal Extradural Intraspinal Facet Cyst;  Surgeon: Marybelle Killings, MD;  Location: [place];  Service: Orthopedics;  Laterality: N/A;     reports that he has been smoking cigarettes.  He has a 20.50 pack-year smoking history. he has never used smokeless tobacco. He reports that he drinks about 3.6 oz of alcohol per week. He reports that he does not use drugs. Social History   Socioeconomic History  . Marital status: Single     Spouse name: Not on file  . Number of children: 1  . Years of education: Not on file  . Highest education level: Not on file  Social Needs  . Financial resource strain: Not on file  . Food insecurity - worry: Not on file  . Food insecurity - inability: Not on file  . Transportation needs - medical: Not on file  . Transportation needs - non-medical: Not on file  Occupational History  . Occupation: retired  . Occupation: part time- church  Tobacco Use  . Smoking status: Current Every Day Smoker    Packs/day: 0.50    Years: 41.00    Pack years: 20.50    Types: Cigarettes  . Smokeless tobacco: Never Used  Substance and Sexual Activity  . Alcohol use: Yes    Alcohol/week: 3.6 oz    Types: 6 Standard drinks or equivalent per week    Comment: occ.  . Drug use: No    Comment: hx of crack/cocaine 20yr ago  . Sexual activity: Not Currently    Comment: declined condoms  Other Topics Concern  . Not on file  Social History Narrative   Retired.       As of 05/2015:   Diet: No salt    Caffeine   Married: Single    House: Condo, 2 stories, 1 person (self)    Pets: No    Current/Past profession: N/A   Exercise: walks daily    Living Will: Yes    DNR   POA/HPOA: No        Family History  Problem Relation Age of Onset  . Heart failure Father   . Hypertension Father   . Diabetes Brother   . Heart attack Brother   . Alzheimer's disease Mother   . Stroke Sister   . Diabetes Sister   . Alzheimer's disease Sister   . Hypertension Brother   . Diabetes Brother   . Drug abuse Brother   . Colon cancer Neg Hx     Allergies  Allergen Reactions  . Amphetamines Other (See Comments)    Pt is unaware of this -     Outpatient Encounter Medications as of 10/11/2017  Medication Sig  . allopurinol (ZYLOPRIM) 100 MG tablet Take 1 tablet (100 mg total) by mouth daily.  .Jearl KlinefelterELLIPTA 62.5-25 MCG/INH AEPB INHALE 1 PUFF INTO THE LUNGS DAILY  . aspirin EC 81 MG tablet Take 81 mg  by mouth daily.  .Marland Kitchenatorvastatin (LIPITOR) 20 MG tablet take 1 tablet by mouth once daily  . B-D UF III MINI PEN NEEDLES 31G X 5 MM MISC use four times a day  . Blood Glucose Monitoring Suppl (ONE TOUCH ULTRA MINI) w/Device  KIT 1 each by Does not apply route 2 (two) times daily. Dx: E11.40  . carvedilol (COREG) 25 MG tablet take 1 tablet by mouth twice a day with meals  . Choline Fenofibrate (FENOFIBRIC ACID) 135 MG CPDR take 1 capsule by mouth once daily  . colchicine 0.6 MG tablet take 1 tablet by mouth CONTINUOUS AS NEEDED FOR GOUT  . furosemide (LASIX) 40 MG tablet Take 40 mg by mouth daily.  [name] gabapentin (NEURONTIN) 300 MG capsule take 1 capsule by mouth three times a day  . GENVOYA 150-150-200-10 MG TABS tablet TAKE ONE TABLET BY MOUTH ONCE DAILY WITH BREAKFAST. STORE IN ORIGINALCONTAINER AT ROOM TEMPERATURE.  . Glucosamine-Chondroit-Vit C-Mn (GLUCOSAMINE CHONDR 1500 COMPLX PO) Take 2 tablets by mouth daily. Reported on 04/04/2016  . glucose blood test strip Use to test blood sugar twice daily. E11.40  . HUMALOG KWIKPEN 100 UNIT/ML KiwkPen INJECT INTO SKIN 12 UNITS SUBCUTANEOUSLY 3 TIMES DAILY BEFORE MEALS  . hydrALAZINE (APRESOLINE) 25 MG tablet Take 1 tablet (25 mg total) by mouth 3 (three) times daily.  . isosorbide mononitrate (IMDUR) 30 MG 24 hr tablet take 1 tablet by mouth once daily  . LANTUS SOLOSTAR 100 UNIT/ML Solostar Pen INJECT 70 UNITS SUBCUTANEOUSLY EVERY MORNING (35 UNITS ON EACH SIDE)  . latanoprost (XALATAN) 0.005 % ophthalmic solution Place 1 drop into both eyes at bedtime. Reported on 04/04/2016  . Naftifine HCl 2 % CREA apply to affected area twice a day  . nitroGLYCERIN (NITROSTAT) 0.3 MG SL tablet Place 1 tablet (0.3 mg total) under the tongue as needed for chest pain (not to exceed 3 tablets in one day.).  . [DISCONTINUED] allopurinol (ZYLOPRIM) 100 MG tablet take 1 tablet by mouth once daily  . [DISCONTINUED] amoxicillin-clavulanate (AUGMENTIN) 875-125 MG tablet Take 1  tablet by mouth 2 (two) times daily. (Patient not taking: Reported on 10/11/2017)  . [DISCONTINUED] dextromethorphan-guaiFENesin (MUCINEX DM) 30-600 MG 12hr tablet Take 2 tablets by mouth 2 (two) times daily. (Patient not taking: Reported on 10/11/2017)  . [DISCONTINUED] GENVOYA 150-150-200-10 MG TABS tablet TAKE ONE TABLET BY MOUTH ONCE DAILY WITH BREAKFAST. STORE IN ORIGINALCONTAINER AT ROOM TEMPERATURE. (Patient not taking: Reported on 10/11/2017)   No facility-administered encounter medications on file as of 10/11/2017.     Review of Systems:  Review of Systems  Constitutional: Positive for fatigue.  Musculoskeletal: Positive for arthralgias.  Skin: Positive for wound.  Neurological: Positive for numbness.  All other systems reviewed and are negative.   Health Maintenance  Topic Date Due  . PNA vac Low Risk Adult (2 of 2 - PPSV23) 08/18/2017  . HEMOGLOBIN A1C  01/07/2018  . OPHTHALMOLOGY EXAM  06/30/2018  . FOOT EXAM  07/21/2018  . LIPID PANEL  10/09/2018  . TETANUS/TDAP  07/27/2021  . COLONOSCOPY  10/01/2022  . INFLUENZA VACCINE  Completed  . Hepatitis C Screening  Completed    Physical Exam: Vitals:   10/11/17 1141  BP: (!) 140/94  Pulse: 62  Temp: 98 F (36.7 C)  TempSrc: Oral  SpO2: 98%  Weight: 200 lb 9.6 oz (91 kg)  Height: '6\' 2"'  (1.88 m)   Body mass index is 25.76 kg/m. Physical Exam  Constitutional: He is oriented to person, place, and time. He appears well-developed and well-nourished.  HENT:  Mouth/Throat: Oropharynx is clear and moist.  MMM; no oral thrush  Eyes: Pupils are equal, round, and reactive to light. No scleral icterus.  Neck: Neck supple. Carotid bruit is not present. No thyromegaly present.  Cardiovascular: Normal rate, regular rhythm and intact distal pulses. Exam reveals no gallop and no friction rub.  Murmur (1/6 SEM) heard. No distal LE edema. No calf TTP  Pulmonary/Chest: Effort normal and breath sounds normal. He has no wheezes. He  has no rales. He exhibits no tenderness.  Abdominal: Soft. Normal appearance and bowel sounds are normal. He exhibits no distension, no abdominal bruit, no pulsatile midline mass and no mass. There is no hepatomegaly. There is no tenderness. There is no rigidity, no rebound and no guarding. No hernia.  obese  Musculoskeletal: He exhibits edema.  Lymphadenopathy:    He has no cervical adenopathy.  Neurological: He is alert and oriented to person, place, and time.  Skin: Skin is warm and dry. No rash noted.  Psychiatric: He has a normal mood and affect. His behavior is normal. Judgment and thought content normal.   Diabetic Foot Exam - Simple   Simple Foot Form Diabetic Foot exam was performed with the following findings:  Yes 10/11/2017  1:09 PM  Visual Inspection See comments:  Yes Sensation Testing See comments:  Yes Pulse Check Posterior Tibialis and Dorsalis pulse intact bilaterally:  Yes Comments Right plantar 5th distal metatarsal preulcerative callus; left plantar distal 1st metatarsal preulcerative callus; b/l large bunion; reduced monofilament b/l      Labs reviewed: Basic Metabolic Panel: Recent Labs    11/10/16 0757  03/15/17 0924 06/27/17 1028 07/18/17 0828  NA  --    < > 141 137 139  K  --    < > 4.5 5.5* 5.5*  CL  --    < > 112* 110 111*  CO2  --    < > 23 15* 24  GLUCOSE  --    < > 53* 137* 73  BUN  --    < > 35* 59* 44*  CREATININE  --    < > 2.37* 3.16* 2.79*  CALCIUM  --    < > 8.8 8.7 9.5  TSH 0.540  --   --   --  0.81   < > = values in this interval not displayed.   Liver Function Tests: Recent Labs    11/11/16 0616 03/15/17 0924 06/27/17 1028 07/18/17 0828  AST '24 25 24 22  ' ALT '24 20 19 16  ' ALKPHOS 65 65 54  --   BILITOT 0.5 0.3 0.3 0.4  PROT 7.2 7.5 7.4 7.8  ALBUMIN 2.9* 3.6 3.6  --    Recent Labs    10/23/16 0007  LIPASE 52*   No results for input(s): AMMONIA in the last 8760 hours. CBC: Recent Labs    11/10/16 0457  11/11/16 0616 11/18/16 1154 06/27/17 1028  WBC 8.1 5.5 5.7 5.1  NEUTROABS 4.5 1.8 1,881  --   HGB 10.0* 9.7* 10.2* 14.2  HCT 31.6* 30.2* 33.1* 42.0  MCV 91.3 89.3 90.7 95.5  PLT 313 276 304 179   Lipid Panel: Recent Labs    03/15/17 0924 07/18/17 0828 10/09/17 0821  CHOL 178 177 211*  HDL 30* 34* 39*  LDLCALC 100*  --   --   TRIG 239* 295* 297*  CHOLHDL 5.9* 5.2* 5.4*   Lab Results  Component Value Date   HGBA1C 6.3 (H) 10/09/2017    Procedures since last visit: No results found.  Assessment/Plan   ICD-10-CM   1. Pre-ulcerative calluses L84   2. Type 2 diabetes mellitus with other skin complication, with long-term current use of insulin (HCC) E11.628 CMP  with eGFR   Z79.4   3. Essential hypertension I10   4. Mixed hyperlipidemia E78.2 Lipid Panel  5. Tobacco abuse Z72.0   6. Chronic kidney disease, stage III (moderate) (HCC) N18.3   7. Human immunodeficiency virus (HIV) disease ([place]) B20    Continue current medications as ordered  Diabetic shoe form completed  Follow up with specialists as scheduled  Smoking cessation discussed and highly urged  Follow up in 3 mos for hyperlipidemia, DM, tobacco abuse. Fasting labs prior to appt   [place] S. Perlie Gold  [facility] [address] [place], [place] [address] [phone] Cell (Monday-Friday 8 AM - 5 PM) [phone] After 5 PM and follow prompts

## 2017-10-21 ENCOUNTER — Encounter (HOSPITAL_COMMUNITY): Payer: Self-pay | Admitting: *Deleted

## 2017-10-21 ENCOUNTER — Ambulatory Visit (HOSPITAL_COMMUNITY)
Admission: EM | Admit: 2017-10-21 | Discharge: 2017-10-21 | Disposition: A | Discharge status: Home / Self Care | Payer: Medicare Other | Attending: Family Medicine | Admitting: Family Medicine

## 2017-10-21 ENCOUNTER — Ambulatory Visit (INDEPENDENT_AMBULATORY_CARE_PROVIDER_SITE_OTHER): Payer: Medicare Other

## 2017-10-21 ENCOUNTER — Other Ambulatory Visit: Payer: Self-pay

## 2017-10-21 DIAGNOSIS — J069 Acute upper respiratory infection, unspecified: Secondary | ICD-10-CM | POA: Diagnosis not present

## 2017-10-21 DIAGNOSIS — R05 Cough: Secondary | ICD-10-CM | POA: Diagnosis not present

## 2017-10-21 DIAGNOSIS — B9789 Other viral agents as the cause of diseases classified elsewhere: Secondary | ICD-10-CM

## 2017-10-21 MED ORDER — HYDROCODONE-HOMATROPINE 5-1.5 MG/5ML PO SYRP: 5.0000 mL | ORAL_SOLUTION | Freq: Four times a day (QID) | ORAL | 0 refills | Status: DC | PRN

## 2017-10-21 NOTE — ED Triage Notes (Signed)
C/O intermittent dizziness with cough and left chest pain only when coughing.  Temps up to 99.9 at home.  HR regularly irregular upon palpation - denies hx of irreg HR, but has had CABG.

## 2017-10-21 NOTE — ED Provider Notes (Signed)
Jacob [name]   [id number] 10/21/17 Arrival Time: 1202  ASSESSMENT & PLAN:  1. Viral URI with cough    Meds ordered this encounter  Medications  . HYDROcodone-homatropine (HYCODAN) 5-1.5 MG/5ML syrup    Sig: Take 5 mLs by mouth every 6 (six) hours as needed for cough.    Dispense:  60 mL    Refill:  0   Ensure adequate fluid intake. Rest. Medication sedation precautions. Will f/u as needed.  Reviewed expectations re: course of current medical issues. Questions answered. Outlined signs and symptoms indicating need for more acute intervention. Patient verbalized understanding. After Visit Summary given.   SUBJECTIVE: History from: patient. Jacob [name] is a 66 y.o. male who presents with complaint of intermittent cough. Reports gradual onset several days ago. Described symptoms have stabilized since beginning. Cough keeping him awake. No specific aggravating or alleviating factors reported. No SOB. No wheezing. OTC medications without relief.  ROS: As per HPI.   OBJECTIVE:  Vitals:   10/21/17 1215  BP: (!) 143/87  Pulse: 69  Resp: 20  Temp: 98.1 F (36.7 C)  SpO2: 97%    General appearance: alert; no distress Eyes: PERRLA; EOMI; conjunctiva normal HENT: normocephalic; atraumatic; TMs normal; nasal mucosa normal; oral mucosa normal Neck: supple  Lungs: clear to auscultation bilaterally; active cough Heart: regular rate and rhythm Back: no CVA tenderness Extremities: no cyanosis or edema; symmetrical with no gross deformities Skin: warm and dry Psychological: alert and cooperative; normal mood and affect  Imaging: Dg Chest 2 View  Result Date: 10/21/2017 CLINICAL DATA:  Cough and fever for 2 days EXAM: CHEST  2 VIEW COMPARISON:  November 11, 2016 FINDINGS: The heart size and mediastinal contours are stable. Mild chronic increased pulmonary interstitium is identified bilaterally unchanged. There is no focal pneumonia or pleural effusion. Chronic  changes of multiple right ribs are stable. IMPRESSION: No active cardiopulmonary disease. Stable chronic changes of the right ribs and the lungs. Electronically Signed   By: Abelardo Diesel M.D.   On: 10/21/2017 13:27    Allergies  Allergen Reactions  . Amphetamines Other (See Comments)    Pt is unaware of this -     Past Medical History:  Diagnosis Date  . Arthritis   . Cataracts, bilateral    immature  . CHF (congestive heart failure) (HCC)    not on any meds  . Chronic back pain    stenosis  . Chronic kidney disease, unspecified   . Coronary atherosclerosis of artery bypass graft   . Coronary atherosclerosis of native coronary artery 2005   s/p surgery  . Drug abuse ([place])    hx; tested for cocaine as recently as 2/08. says he is not using drugs now - avoided defib. for this reason   . GERD (gastroesophageal reflux disease)    takes OTC meds as needed  . Glaucoma    uses eye drops daily  . Gout, unspecified    takes Allopurinol daily as well as Colchicine-if needed  . Hepatitis 1967   Hep C  . History of colon polyps    benign  . HTN (hypertension)    takes Coreg,Imdur.and Apresoline daily  . Human immunodeficiency virus (HIV) disease ([place])    takes Genvoya daily  . Hyperlipidemia    takes Atorvastatin daily  . Ischemic cardiomyopathy   . Muscle spasm    takes Zanaflex as needed  . Nocturia   . Peripheral neuropathy    takes gabapentin daily  .  Pneumonia    hx of  . Shortness of breath dyspnea    rarely but if notices it then with exertion  . Syphilis, unspecified   . Type II or unspecified type diabetes mellitus without mention of complication, not stated as uncontrolled 2004   Lantus daily.Average fasting blood sugar 125-199   Social History   Socioeconomic History  . Marital status: Single    Spouse name: Not on file  . Number of children: 1  . Years of education: Not on file  . Highest education level: Not on file  Social Needs  . Financial resource  strain: Not on file  . Food insecurity - worry: Not on file  . Food insecurity - inability: Not on file  . Transportation needs - medical: Not on file  . Transportation needs - non-medical: Not on file  Occupational History  . Occupation: retired  . Occupation: part time- church  Tobacco Use  . Smoking status: Current Every Day Smoker    Packs/day: 0.00    Years: 41.00    Pack years: 0.00    Types: Cigarettes  . Smokeless tobacco: Never Used  . Tobacco comment: States started smoking again today  Substance and Sexual Activity  . Alcohol use: Yes    Comment: occasionally  . Drug use: No    Comment: hx of crack/cocaine 28yrs ago  . Sexual activity: Not Currently    Comment: declined condoms  Other Topics Concern  . Not on file  Social History Narrative   Retired.       As of 05/2015:   Diet: No salt    Caffeine   Married: Single    House: Condo, 2 stories, 1 person (self)    Pets: No    Current/Past profession: N/A   Exercise: walks daily    Living Will: Yes    DNR   POA/HPOA: No       Family History  Problem Relation Age of Onset  . Heart failure Father   . Hypertension Father   . Diabetes Brother   . Heart attack Brother   . Alzheimer's disease Mother   . Stroke Sister   . Diabetes Sister   . Alzheimer's disease Sister   . Hypertension Brother   . Diabetes Brother   . Drug abuse Brother   . Colon cancer Neg Hx    Past Surgical History:  Procedure Laterality Date  . BACK SURGERY    . CARDIAC SURGERY    . CHOLECYSTECTOMY    . COLONOSCOPY  2013   Dr.John Henrene Pastor   . CORONARY ARTERY BYPASS GRAFT    . laproscopic cholecystectomy  8/07  . left intertrochanteric hip fracture  2004   s/p intermedullary nail placement 2/08  . left transverse mandibular fracture  10/05  . LUMBAR LAMINECTOMY/DECOMPRESSION MICRODISCECTOMY N/A 02/29/2016   Procedure: Left L4-5 Lateral Recess Decompression, Removal Extradural Intraspinal Facet Cyst;  Surgeon: Marybelle Killings, MD;   Location: [place];  Service: Orthopedics;  Laterality: N/AVanessa Kick, MD 10/23/17 1006

## 2017-10-23 ENCOUNTER — Telehealth: Payer: Self-pay | Admitting: Cardiovascular Disease

## 2017-10-23 NOTE — Telephone Encounter (Signed)
Walk in pt Form-[employer] paper dropped off placed in [employer].

## 2017-10-25 ENCOUNTER — Telehealth: Payer: Self-pay | Admitting: *Deleted

## 2017-10-25 MED ORDER — NAFTIFINE HCL 2 % EX CREA: TOPICAL_CREAM | CUTANEOUS | 3 refills | Status: DC

## 2017-10-25 NOTE — Telephone Encounter (Signed)
Refill request Naftin. Dr. Milinda Pointer states refill +3additional, pt needs an appt prior to future refills.

## 2017-10-30 ENCOUNTER — Encounter (HOSPITAL_BASED_OUTPATIENT_CLINIC_OR_DEPARTMENT_OTHER): Payer: Medicare Other | Attending: Internal Medicine

## 2017-10-30 DIAGNOSIS — E1142 Type 2 diabetes mellitus with diabetic polyneuropathy: Secondary | ICD-10-CM | POA: Diagnosis not present

## 2017-10-30 DIAGNOSIS — Z794 Long term (current) use of insulin: Secondary | ICD-10-CM | POA: Insufficient documentation

## 2017-10-30 DIAGNOSIS — I2510 Atherosclerotic heart disease of native coronary artery without angina pectoris: Secondary | ICD-10-CM | POA: Diagnosis not present

## 2017-10-30 DIAGNOSIS — L97529 Non-pressure chronic ulcer of other part of left foot with unspecified severity: Secondary | ICD-10-CM | POA: Diagnosis not present

## 2017-10-30 DIAGNOSIS — J449 Chronic obstructive pulmonary disease, unspecified: Secondary | ICD-10-CM | POA: Insufficient documentation

## 2017-10-30 DIAGNOSIS — L97528 Non-pressure chronic ulcer of other part of left foot with other specified severity: Secondary | ICD-10-CM | POA: Diagnosis not present

## 2017-10-30 DIAGNOSIS — I252 Old myocardial infarction: Secondary | ICD-10-CM | POA: Insufficient documentation

## 2017-10-30 DIAGNOSIS — E11621 Type 2 diabetes mellitus with foot ulcer: Secondary | ICD-10-CM | POA: Diagnosis not present

## 2017-11-02 ENCOUNTER — Telehealth: Payer: Self-pay | Admitting: Cardiovascular Disease

## 2017-11-02 NOTE — Telephone Encounter (Signed)
Patient aware [employer] [facility] Verification form signed & completed. I have mailed to patient home address per his request.

## 2017-11-06 ENCOUNTER — Ambulatory Visit (INDEPENDENT_AMBULATORY_CARE_PROVIDER_SITE_OTHER): Payer: Medicare Other | Admitting: Pharmacotherapy

## 2017-11-06 ENCOUNTER — Encounter: Payer: Self-pay | Admitting: Pharmacotherapy

## 2017-11-06 VITALS — BP 138/72 | HR 63 | Resp 12 | Ht 74.0 in | Wt 200.0 lb

## 2017-11-06 DIAGNOSIS — I10 Essential (primary) hypertension: Secondary | ICD-10-CM

## 2017-11-06 DIAGNOSIS — Z794 Long term (current) use of insulin: Secondary | ICD-10-CM

## 2017-11-06 DIAGNOSIS — N183 Chronic kidney disease, stage 3 unspecified: Secondary | ICD-10-CM

## 2017-11-06 DIAGNOSIS — I2510 Atherosclerotic heart disease of native coronary artery without angina pectoris: Secondary | ICD-10-CM | POA: Diagnosis not present

## 2017-11-06 DIAGNOSIS — L97528 Non-pressure chronic ulcer of other part of left foot with other specified severity: Secondary | ICD-10-CM | POA: Diagnosis not present

## 2017-11-06 DIAGNOSIS — E11628 Type 2 diabetes mellitus with other skin complications: Secondary | ICD-10-CM | POA: Diagnosis not present

## 2017-11-06 DIAGNOSIS — I252 Old myocardial infarction: Secondary | ICD-10-CM | POA: Diagnosis not present

## 2017-11-06 DIAGNOSIS — E1142 Type 2 diabetes mellitus with diabetic polyneuropathy: Secondary | ICD-10-CM | POA: Diagnosis not present

## 2017-11-06 DIAGNOSIS — J449 Chronic obstructive pulmonary disease, unspecified: Secondary | ICD-10-CM | POA: Diagnosis not present

## 2017-11-06 DIAGNOSIS — L97529 Non-pressure chronic ulcer of other part of left foot with unspecified severity: Secondary | ICD-10-CM | POA: Diagnosis not present

## 2017-11-06 DIAGNOSIS — E11621 Type 2 diabetes mellitus with foot ulcer: Secondary | ICD-10-CM | POA: Diagnosis not present

## 2017-11-06 NOTE — Patient Instructions (Signed)
Keep up good work!  

## 2017-11-06 NOTE — Progress Notes (Signed)
  Subjective:    Jacob [name] is a 67 y.o.African American male who presents for follow-up of Type 2 diabetes mellitus.   A1C: 6.3% (was 7.0%) Currently on Lantus and Humalog.  BG: 67-138 Hypoglycemia x 1 (lowest BG: 42m/dl)  Lantus 70 units daily Humalog 12 units with each meal. Denies problems with vision. Has burning in left foot, not a new problem.  Going to wound center for blood blister on left foot. No peripheral edema Nocturia is rare No dysuria Staying well hydrated. Walking for exercise. Making healthy food choices.  Review of Systems A comprehensive review of systems was negative except for: Eyes: positive for contacts/glasses    Objective:    BP 138/72   Pulse 63   Resp 12   Ht 6' 2"$  (1.88 m)   Wt 200 lb (90.7 kg)   SpO2 97%   BMI 25.68 kg/m   General:  alert, cooperative and no distress  Oropharynx: normal findings: lips normal without lesions and gums healthy   Eyes:  negative findings: lids and lashes normal and conjunctivae and sclerae normal   Ears:  external ears normal        Lung: clear to auscultation bilaterally  Heart:  regular rate and rhythm     Extremities: extremities normal, atraumatic, no cyanosis or edema  Skin: see HPI     Neuro: mental status, speech normal, alert and oriented x3   Lab Review Glucose, Bld (mg/dL)  Date Value  07/18/2017 73  06/27/2017 137 (H)  03/15/2017 53 (L)   CO2 (mmol/L)  Date Value  07/18/2017 24  06/27/2017 15 (L)  03/15/2017 23   BUN (mg/dL)  Date Value  07/18/2017 44 (H)  06/27/2017 59 (H)  03/15/2017 35 (H)  02/08/2017 55 (H)  01/30/2017 49 (H)  01/26/2017 56 (H)   Creat (mg/dL)  Date Value  07/18/2017 2.79 (H)  06/27/2017 3.16 (H)  03/15/2017 2.37 (H)       Assessment:    Diabetes Mellitus type II, under excellent control. A1C at goal <7% BP slightly above goal <130/80 Kidney function is stable.   Plan:    1.  Rx changes: none  2.  DM control is excellent with rare  hypoglycemia.  Continue Lantus 70 units daily.  Continue Humalog 12 units with each meals. 3.  BP close to goal.  Will continue current RX and monitor. 4.  Praised exercise habits. 5.  RTC in 3 months.

## 2017-11-08 ENCOUNTER — Ambulatory Visit: Payer: Medicare Other

## 2017-11-13 ENCOUNTER — Ambulatory Visit: Payer: Medicare Other | Admitting: Pharmacotherapy

## 2017-11-13 ENCOUNTER — Encounter: Payer: Self-pay | Admitting: Internal Medicine

## 2017-11-22 ENCOUNTER — Encounter: Payer: Self-pay | Admitting: Podiatry

## 2017-11-22 ENCOUNTER — Ambulatory Visit (INDEPENDENT_AMBULATORY_CARE_PROVIDER_SITE_OTHER): Payer: Medicare Other | Admitting: Podiatry

## 2017-11-22 DIAGNOSIS — E0842 Diabetes mellitus due to underlying condition with diabetic polyneuropathy: Secondary | ICD-10-CM

## 2017-11-22 DIAGNOSIS — M79676 Pain in unspecified toe(s): Secondary | ICD-10-CM | POA: Diagnosis not present

## 2017-11-22 DIAGNOSIS — B351 Tinea unguium: Secondary | ICD-10-CM

## 2017-11-22 DIAGNOSIS — L989 Disorder of the skin and subcutaneous tissue, unspecified: Secondary | ICD-10-CM

## 2017-11-23 ENCOUNTER — Other Ambulatory Visit: Payer: Self-pay | Admitting: Internal Medicine

## 2017-11-23 DIAGNOSIS — I10 Essential (primary) hypertension: Secondary | ICD-10-CM

## 2017-11-25 NOTE — Progress Notes (Signed)
Subjective: Patient is a 67 y.o. male with PMHx of T2DM presenting to the office today for follow up evaluation of pre-ulcerative callus lesions to the bilateral feet. Patient also complains of elongated, thickened nails that cause pain while ambulating in shoes. Patient is unable to trim their own nails. Patient presents today for further treatment and evaluation.   Past Medical History:  Diagnosis Date  . Arthritis   . Cataracts, bilateral    immature  . CHF (congestive heart failure) (HCC)    not on any meds  . Chronic back pain    stenosis  . Chronic kidney disease, unspecified   . Coronary atherosclerosis of artery bypass graft   . Coronary atherosclerosis of native coronary artery 2005   s/p surgery  . Drug abuse ([place])    hx; tested for cocaine as recently as 2/08. says he is not using drugs now - avoided defib. for this reason   . GERD (gastroesophageal reflux disease)    takes OTC meds as needed  . Glaucoma    uses eye drops daily  . Gout, unspecified    takes Allopurinol daily as well as Colchicine-if needed  . Hepatitis 1967   Hep C  . History of colon polyps    benign  . HTN (hypertension)    takes Coreg,Imdur.and Apresoline daily  . Human immunodeficiency virus (HIV) disease ([employer])    takes Genvoya daily  . Hyperlipidemia    takes Atorvastatin daily  . Ischemic cardiomyopathy   . Muscle spasm    takes Zanaflex as needed  . Nocturia   . Peripheral neuropathy    takes gabapentin daily  . Pneumonia    hx of  . Shortness of breath dyspnea    rarely but if notices it then with exertion  . Syphilis, unspecified   . Type II or unspecified type diabetes mellitus without mention of complication, not stated as uncontrolled 2004   Lantus daily.Average fasting blood sugar 125-199    Objective:  Physical Exam General: Alert and oriented x3 in no acute distress  Dermatology: Hyperkeratotic lesions present on the bilateral feet x 3. Pain on palpation with a  central nucleated core noted. Skin is warm, dry and supple bilateral lower extremities. Negative for open lesions or macerations. Nails are tender, long, thickened and dystrophic with subungual debris, consistent with onychomycosis, 1-5 bilateral. No signs of infection noted.  Vascular: Palpable pedal pulses bilaterally. No edema or erythema noted. Capillary refill within normal limits.  Neurological: Epicritic and protective threshold grossly intact bilaterally.   Musculoskeletal Exam: Pain on palpation at the keratotic lesion noted. Range of motion within normal limits bilateral. Muscle strength 5/5 in all groups bilateral.  Assessment: 1. Onychodystrophic nails 1-5 bilateral with hyperkeratosis of nails.  2. Onychomycosis of nail due to dermatophyte bilateral 3. Pre-ulcerative callus lesions to the bilateral feet x 3   Plan of Care:  #1 Patient evaluated. #2 Excisional debridement of keratoic lesions using a chisel blade was performed without incident.  #3 Dressed with light dressing. #4 Mechanical debridement of nails 1-5 bilaterally performed using a nail nipper. Filed with dremel without incident.  #5 Patient is to return to the clinic in 3 months.   Edrick Kins, DPM [facility]  Dr. Edrick Kins, DPM    [address]  [place], [place] [address]                Office [phone]  Fax [phone]   

## 2017-11-30 DIAGNOSIS — E119 Type 2 diabetes mellitus without complications: Secondary | ICD-10-CM | POA: Diagnosis not present

## 2017-12-01 DIAGNOSIS — Z720 Tobacco use: Secondary | ICD-10-CM | POA: Diagnosis not present

## 2017-12-01 DIAGNOSIS — D631 Anemia in chronic kidney disease: Secondary | ICD-10-CM | POA: Diagnosis not present

## 2017-12-01 DIAGNOSIS — N2581 Secondary hyperparathyroidism of renal origin: Secondary | ICD-10-CM | POA: Diagnosis not present

## 2017-12-01 DIAGNOSIS — N184 Chronic kidney disease, stage 4 (severe): Secondary | ICD-10-CM | POA: Diagnosis not present

## 2017-12-04 DIAGNOSIS — Z7689 Persons encountering health services in other specified circumstances: Secondary | ICD-10-CM | POA: Diagnosis not present

## 2017-12-06 ENCOUNTER — Telehealth: Payer: Self-pay | Admitting: *Deleted

## 2017-12-06 NOTE — Telephone Encounter (Signed)
Patient called to report that 12/04/17 he was given a new medication by his Nephrologist. It is Kayexalate powder which is a one time use to help flush his potassium which is to high. He wanted the provider to know.

## 2017-12-08 ENCOUNTER — Telehealth: Payer: Self-pay | Admitting: Behavioral Health

## 2017-12-08 NOTE — Telephone Encounter (Signed)
Writer returned Mr. Sentell's call and he was inquiring about the Shingles vaccination.  Writer explained to patient that we do not currently carry the Shingles vaccine and he can check with his Primary care physician.  Also patient wanted to know if the vaccine was cover under RW and writer explained to patient that it is not and he has Medicare and should be covered through that.  Patient verbalized understanding and had no additional questions. Pricilla Riffle RN

## 2017-12-12 ENCOUNTER — Other Ambulatory Visit: Payer: Medicare Other

## 2017-12-12 DIAGNOSIS — B20 Human immunodeficiency virus [HIV] disease: Secondary | ICD-10-CM

## 2017-12-13 LAB — BASIC METABOLIC PANEL WITH GFR
BUN/Creatinine Ratio: 16 (calc) (ref 6–22)
BUN: 51 mg/dL — AB (ref 7–25)
CALCIUM: 9.3 mg/dL (ref 8.6–10.3)
CO2: 16 mmol/L — ABNORMAL LOW (ref 20–32)
CREATININE: 3.2 mg/dL — AB (ref 0.70–1.25)
Chloride: 111 mmol/L — ABNORMAL HIGH (ref 98–110)
GFR, EST AFRICAN AMERICAN: 22 mL/min/{1.73_m2} — AB (ref >=60)
GFR, EST NON AFRICAN AMERICAN: 19 mL/min/{1.73_m2} — AB (ref >=60)
Glucose, Bld: 96 mg/dL (ref 65–99)
Potassium: 5.5 mmol/L — ABNORMAL HIGH (ref 3.5–5.3)
Sodium: 137 mmol/L (ref 135–146)

## 2017-12-13 LAB — T-HELPER CELL (CD4) - (RCID CLINIC ONLY)
CD4 % Helper T Cell: 38 % (ref 33–55)
CD4 T CELL ABS: 860 /uL (ref 400–2700)

## 2017-12-13 LAB — CBC
HCT: 44.3 % (ref 38.5–50.0)
HEMOGLOBIN: 15 g/dL (ref 13.2–17.1)
MCH: 32 pg (ref 27.0–33.0)
MCHC: 33.9 g/dL (ref 32.0–36.0)
MCV: 94.5 fL (ref 80.0–100.0)
MPV: 12 fL (ref 7.5–12.5)
Platelets: 156 10*3/uL (ref 140–400)
RBC: 4.69 10*6/uL (ref 4.20–5.80)
RDW: 13.2 % (ref 11.0–15.0)
WBC: 4.6 10*3/uL (ref 3.8–10.8)

## 2017-12-13 LAB — FLUORESCENT TREPONEMAL AB(FTA)-IGG-BLD: FLUORESCENT TREPONEMAL ABS: REACTIVE — AB

## 2017-12-13 LAB — RPR: RPR: REACTIVE — AB

## 2017-12-13 LAB — RPR TITER: RPR Titer: 1:1 {titer} — ABNORMAL HIGH

## 2017-12-14 LAB — HIV-1 RNA QUANT-NO REFLEX-BLD
HIV 1 RNA Quant: <20 {copies}/mL
HIV-1 RNA Quant, Log: <1.3 {Log_copies}/mL

## 2017-12-20 ENCOUNTER — Other Ambulatory Visit: Payer: Self-pay | Admitting: Cardiovascular Disease

## 2017-12-20 ENCOUNTER — Ambulatory Visit (INDEPENDENT_AMBULATORY_CARE_PROVIDER_SITE_OTHER): Payer: Medicare Other | Admitting: Podiatry

## 2017-12-20 ENCOUNTER — Other Ambulatory Visit: Payer: Self-pay | Admitting: Internal Medicine

## 2017-12-20 DIAGNOSIS — L989 Disorder of the skin and subcutaneous tissue, unspecified: Secondary | ICD-10-CM

## 2017-12-20 DIAGNOSIS — L97512 Non-pressure chronic ulcer of other part of right foot with fat layer exposed: Secondary | ICD-10-CM

## 2017-12-20 DIAGNOSIS — I70235 Atherosclerosis of native arteries of right leg with ulceration of other part of foot: Secondary | ICD-10-CM

## 2017-12-20 DIAGNOSIS — E0842 Diabetes mellitus due to underlying condition with diabetic polyneuropathy: Secondary | ICD-10-CM | POA: Diagnosis not present

## 2017-12-24 NOTE — Progress Notes (Signed)
Subjective:  67 year old male presenting today for follow up evaluation of a painful pre-ulcerative callus lesion of the right foot. The ulceration seems to have cracked open in the last week. He also has a new complaint of an intermittent, hot, burning sensation to the right great toe that began 2 days ago. Walking and bearing weight with shoes exacerbates the pain. He has not done anything to treat the symptoms. Patient is here for further evaluation and treatment.   Past Medical History:  Diagnosis Date  . Arthritis   . Cataracts, bilateral    immature  . CHF (congestive heart failure) (HCC)    not on any meds  . Chronic back pain    stenosis  . Chronic kidney disease, unspecified   . Coronary atherosclerosis of artery bypass graft   . Coronary atherosclerosis of native coronary artery 2005   s/p surgery  . Drug abuse ([place])    hx; tested for cocaine as recently as 2/08. says he is not using drugs now - avoided defib. for this reason   . GERD (gastroesophageal reflux disease)    takes OTC meds as needed  . Glaucoma    uses eye drops daily  . Gout, unspecified    takes Allopurinol daily as well as Colchicine-if needed  . Hepatitis 1967   Hep C  . History of colon polyps    benign  . HTN (hypertension)    takes Coreg,Imdur.and Apresoline daily  . Human immunodeficiency virus (HIV) disease ([place])    takes Genvoya daily  . Hyperlipidemia    takes Atorvastatin daily  . Ischemic cardiomyopathy   . Muscle spasm    takes Zanaflex as needed  . Nocturia   . Peripheral neuropathy    takes gabapentin daily  . Pneumonia    hx of  . Shortness of breath dyspnea    rarely but if notices it then with exertion  . Syphilis, unspecified   . Type II or unspecified type diabetes mellitus without mention of complication, not stated as uncontrolled 2004   Lantus daily.Average fasting blood sugar 125-199      Objective/Physical Exam General: The patient is alert and oriented x3 in no  acute distress.  Dermatology:  Wound #1 noted to the right foot measuring 0.3 x 0.3 x 0.2 cm (LxWxD).   To the noted ulceration(s), there is no eschar. There is a moderate amount of slough, fibrin, and necrotic tissue noted. Granulation tissue and wound base is red. There is a minimal amount of serosanguineous drainage noted. There is no exposed bone muscle-tendon ligament or joint. There is no malodor. Periwound integrity is intact. Hyperkeratotic lesion present on the right foot. Pain on palpation with a central nucleated core noted. Skin is warm, dry and supple bilateral lower extremities.  Vascular: Palpable pedal pulses bilaterally. No edema or erythema noted. Capillary refill within normal limits.  Neurological: Epicritic and protective threshold absent bilaterally.   Musculoskeletal Exam: Range of motion within normal limits to all pedal and ankle joints bilateral. Muscle strength 5/5 in all groups bilateral.   Assessment: #1 ulceration to the right foot secondary to diabetes mellitus #2 diabetes mellitus w/ peripheral neuropathy #3 pre-ulcerative callus lesion noted to the right foot   Plan of Care:  #1 Patient was evaluated. #2 medically necessary excisional debridement including subcutaneous tissue was performed using a tissue nipper and a chisel blade. Excisional debridement of all the necrotic nonviable tissue down to healthy bleeding viable tissue was performed with post-debridement measurements  same as pre-. #3 the wound was cleansed and dry sterile dressing applied. #4 recommended antibiotic ointment to be applied daily with a Band-Aid.  #5 Excisional debridement of keratoic lesion using a chisel blade was performed without incident. Light dressing applied. #6 Continue wearing DM shoes. #7 Return to clinic as needed.   Goes by [employer].   Edrick Kins, DPM [facility]  Dr. Edrick Kins, [address]                                          [place], [place] [address]                Office [phone]  Fax [phone]

## 2017-12-26 ENCOUNTER — Ambulatory Visit: Payer: Medicare Other | Admitting: Internal Medicine

## 2017-12-27 ENCOUNTER — Encounter: Payer: Self-pay | Admitting: Internal Medicine

## 2017-12-27 ENCOUNTER — Ambulatory Visit (INDEPENDENT_AMBULATORY_CARE_PROVIDER_SITE_OTHER): Payer: Medicare Other | Admitting: Internal Medicine

## 2017-12-27 DIAGNOSIS — B20 Human immunodeficiency virus [HIV] disease: Secondary | ICD-10-CM

## 2017-12-27 DIAGNOSIS — N183 Chronic kidney disease, stage 3 unspecified: Secondary | ICD-10-CM

## 2017-12-27 DIAGNOSIS — I10 Essential (primary) hypertension: Secondary | ICD-10-CM | POA: Diagnosis not present

## 2017-12-27 NOTE — Progress Notes (Signed)
Patient Active Problem List   Diagnosis Date Noted  . Human immunodeficiency virus (HIV) disease ([place]) 09/02/2006    Priority: High  . Anemia of chronic disease 11/20/2016  . CKD (chronic kidney disease) stage 3, GFR 30-59 ml/min (HCC) 11/20/2016  . CHF (congestive heart failure) ([place]) 11/10/2016  . Protein-calorie malnutrition, severe 08/03/2016  . Severe sepsis ([place]) 08/02/2016  . Pneumonia 08/02/2016  . History of lumbar laminectomy for spinal cord decompression 02/29/2016  . Renal failure (ARF), acute on chronic (HCC) 11/03/2015  . Dehydration 11/03/2015  . Alcohol dependence ([place]) 11/03/2015  . Type 2 diabetes mellitus with vascular disease ([place]) 06/17/2015  . HTN (hypertension) 04/26/2015  . Syncope 04/26/2015  . DM (diabetes mellitus) ([place]) 04/26/2015  . Ischemic cardiomyopathy 05/12/2014  . Diarrhea 09/08/2013  . Shock ([place]) 09/06/2013  . Ulcer of lower extremity ([place]) 09/06/2013  . Community acquired pneumonia 09/06/2013  . Hyponatremia 09/06/2013  . Hyperkalemia 09/06/2013  . Hyperglycemia 09/06/2013  . Elevated transaminase level 09/06/2013  . Chest pain 09/06/2013  . Type II or unspecified type diabetes mellitus with unspecified complication, not stated as uncontrolled 10/22/2012  . Black stool 04/17/2012  . Bunion of left foot 11/25/2011  . Bunion, right foot 11/25/2011  . Polysubstance abuse ([place]) 07/28/2011  . Hip fracture, left ([place]) 04/04/2011  . Closed fracture of neck of femur ([place]) 04/04/2011  . Insomnia 02/18/2011  . CAD (coronary artery disease) 04/20/2009  . Chronic combined systolic and diastolic congestive heart failure ([place]) 04/20/2009  . HLD (hyperlipidemia) 11/20/2006  . Gout 11/20/2006  . TOBACCO ABUSE 11/20/2006  . Essential hypertension 11/20/2006  . SYPHILIS 09/02/2006    Patient's Medications  New Prescriptions   No medications on file  Previous Medications   ALLOPURINOL (ZYLOPRIM) 100 MG TABLET    Take 1 tablet (100 mg  total) by mouth daily.   ANORO ELLIPTA 62.5-25 MCG/INH AEPB    INHALE 1 PUFF INTO THE LUNGS DAILY   ASPIRIN EC 81 MG TABLET    Take 81 mg by mouth daily.   ATORVASTATIN (LIPITOR) 20 MG TABLET    take 1 tablet by mouth once daily   B-D UF III MINI PEN NEEDLES 31G X 5 MM MISC    use four times a day   BLOOD GLUCOSE MONITORING SUPPL (ONE TOUCH ULTRA MINI) W/DEVICE KIT    1 each by Does not apply route 2 (two) times daily. Dx: E11.40   CARVEDILOL (COREG) 25 MG TABLET    TAKE 1 TABLET BY MOUTH TWICE A DAY WITH MEALS   CHOLINE FENOFIBRATE (FENOFIBRIC ACID) 135 MG CPDR    take 1 capsule by mouth once daily   COLCHICINE 0.6 MG TABLET    take 1 tablet by mouth CONTINUOUS AS NEEDED FOR GOUT   FUROSEMIDE (LASIX) 40 MG TABLET    Take 40 mg by mouth daily.   GABAPENTIN (NEURONTIN) 300 MG CAPSULE    TAKE 1 CAPSULE BY MOUTH THREE TIMES A DAY   GENVOYA 150-150-200-10 MG TABS TABLET    TAKE ONE TABLET BY MOUTH ONCE DAILY WITH BREAKFAST. STORE IN ORIGINALCONTAINER AT ROOM TEMPERATURE.   GLUCOSAMINE-CHONDROIT-VIT C-MN (GLUCOSAMINE CHONDR 1500 COMPLX PO)    Take 2 tablets by mouth daily. Reported on 04/04/2016   GLUCOSE BLOOD TEST STRIP    Use to test blood sugar twice daily. E11.40   HUMALOG KWIKPEN 100 UNIT/ML KIWKPEN    INJECT INTO SKIN 12 UNITS SUBCUTANEOUSLY 3 TIMES DAILY BEFORE  MEALS   HYDRALAZINE (APRESOLINE) 25 MG TABLET    take 1 tablet by mouth three times a day   ISOSORBIDE MONONITRATE (IMDUR) 30 MG 24 HR TABLET    take 1 tablet by mouth once daily   LANTUS SOLOSTAR 100 UNIT/ML SOLOSTAR PEN    INJECT 70 UNITS SUBCUTANEOUSLY EVERY MORNING (35 UNITS ON EACH SIDE)   LATANOPROST (XALATAN) 0.005 % OPHTHALMIC SOLUTION    Place 1 drop into both eyes at bedtime. Reported on 04/04/2016   NAFTIFINE HCL (NAFTIN) 2 % CREA    Apply to affected area twice daily.   NITROGLYCERIN (NITROSTAT) 0.3 MG SL TABLET    Place 1 tablet (0.3 mg total) under the tongue as needed for chest pain (not to exceed 3 tablets in one day.).    Modified Medications   No medications on file  Discontinued Medications   No medications on file    Subjective: Jacob [name] is in for his routine HIV follow-up visit.  He has had no problems obtaining, taking or tolerating his Genvoya.  He takes it each morning with breakfast.  He recalls missing only 2 doses since his last visit.  That occurred last month when he had "the flu".  He was so sick he forgot to take it 2 days in a row.  He has been measuring his blood pressure at home and has found that it has been running higher than normal recently.  He was also told that his kidney function got a little worse recently.  He has had a little bit of right sided lower back pain recently but otherwise is feeling well.  Review of Systems: Review of Systems  Constitutional: Negative for chills, diaphoresis, fever, malaise/fatigue and weight loss.  HENT: Negative for sore throat.   Respiratory: Negative for cough, sputum production and shortness of breath.   Cardiovascular: Negative for chest pain.  Gastrointestinal: Negative for abdominal pain, diarrhea, heartburn, nausea and vomiting.  Genitourinary: Negative for dysuria and frequency.  Musculoskeletal: Positive for back pain. Negative for joint pain and myalgias.  Skin: Negative for rash.  Neurological: Negative for dizziness and headaches.  Psychiatric/Behavioral: Negative for depression and substance abuse. The patient is not nervous/anxious.     Past Medical History:  Diagnosis Date  . Arthritis   . Cataracts, bilateral    immature  . CHF (congestive heart failure) (HCC)    not on any meds  . Chronic back pain    stenosis  . Chronic kidney disease, unspecified   . Coronary atherosclerosis of artery bypass graft   . Coronary atherosclerosis of native coronary artery 2005   s/p surgery  . Drug abuse ([place])    hx; tested for cocaine as recently as 2/08. says he is not using drugs now - avoided defib. for this reason   . GERD  (gastroesophageal reflux disease)    takes OTC meds as needed  . Glaucoma    uses eye drops daily  . Gout, unspecified    takes Allopurinol daily as well as Colchicine-if needed  . Hepatitis 1967   Hep C  . History of colon polyps    benign  . HTN (hypertension)    takes Coreg,Imdur.and Apresoline daily  . Human immunodeficiency virus (HIV) disease ([place])    takes Genvoya daily  . Hyperlipidemia    takes Atorvastatin daily  . Ischemic cardiomyopathy   . Muscle spasm    takes Zanaflex as needed  . Nocturia   . Peripheral neuropathy    takes  gabapentin daily  . Pneumonia    hx of  . Shortness of breath dyspnea    rarely but if notices it then with exertion  . Syphilis, unspecified   . Type II or unspecified type diabetes mellitus without mention of complication, not stated as uncontrolled 2004   Lantus daily.Average fasting blood sugar 125-199    Social History   Tobacco Use  . Smoking status: Current Every Day Smoker    Packs/day: 0.00    Years: 41.00    Pack years: 0.00    Types: Cigarettes  . Smokeless tobacco: Never Used  . Tobacco comment: States started smoking again today  Substance Use Topics  . Alcohol use: Yes    Comment: occasionally  . Drug use: No    Comment: hx of crack/cocaine 53yr ago    Family History  Problem Relation Age of Onset  . Heart failure Father   . Hypertension Father   . Diabetes Brother   . Heart attack Brother   . Alzheimer's disease Mother   . Stroke Sister   . Diabetes Sister   . Alzheimer's disease Sister   . Hypertension Brother   . Diabetes Brother   . Drug abuse Brother   . Colon cancer Neg Hx     Allergies  Allergen Reactions  . Amphetamines Other (See Comments)    Pt is unaware of this -     Health Maintenance  Topic Date Due  . PNA vac Low Risk Adult (2 of 2 - PPSV23) 08/18/2017  . HEMOGLOBIN A1C  01/07/2018  . OPHTHALMOLOGY EXAM  06/30/2018  . LIPID PANEL  10/09/2018  . FOOT EXAM  10/11/2018  .  TETANUS/TDAP  07/27/2021  . COLONOSCOPY  10/01/2022  . INFLUENZA VACCINE  Completed  . Hepatitis C Screening  Completed    Objective:  Vitals:   12/27/17 1508  BP: (!) 170/83  Pulse: 70  Temp: 98.2 F (36.8 C)  TempSrc: Oral  Weight: 204 lb 4 oz (92.6 kg)  Height: '6\' 2"'  (1.88 m)   Body mass index is 26.22 kg/m.  Physical Exam  Constitutional: He is oriented to person, place, and time.  He is in good spirits.  HENT:  Mouth/Throat: No oropharyngeal exudate.  Eyes: Conjunctivae are normal.  Cardiovascular: Normal rate and regular rhythm.  No murmur heard. Pulmonary/Chest: Effort normal and breath sounds normal. He has no wheezes. He has no rales.  Abdominal: Soft. He exhibits no mass. There is no tenderness.  Musculoskeletal: Normal range of motion.  Neurological: He is alert and oriented to person, place, and time.  Skin: No rash noted.  Psychiatric: Mood and affect normal.    Lab Results Lab Results  Component Value Date   WBC 4.6 12/12/2017   HGB 15.0 12/12/2017   HCT 44.3 12/12/2017   MCV 94.5 12/12/2017   PLT 156 12/12/2017    Lab Results  Component Value Date   CREATININE 3.20 (H) 12/12/2017   BUN 51 (H) 12/12/2017   NA 137 12/12/2017   K 5.5 (H) 12/12/2017   CL 111 (H) 12/12/2017   CO2 16 (L) 12/12/2017    Lab Results  Component Value Date   ALT 16 07/18/2017   AST 22 07/18/2017   ALKPHOS 54 06/27/2017   BILITOT 0.4 07/18/2017    Lab Results  Component Value Date   CHOL 211 (H) 10/09/2017   HDL 39 (L) 10/09/2017   LDLCALC 100 (H) 03/15/2017   TRIG 297 (H) 10/09/2017   CHOLHDL  5.4 (H) 10/09/2017   Lab Results  Component Value Date   LABRPR REACTIVE (A) 12/12/2017   RPRTITER 1:1 (H) 12/12/2017   HIV 1 RNA Quant (copies/mL)  Date Value  12/12/2017 <20 NOT DETECTED  06/27/2017 <20 DETECTED (A)  06/28/2016 <20   CD4 T Cell Abs (/uL)  Date Value  12/12/2017 860  06/27/2017 970  06/28/2016 1,330     Problem List Items Addressed This  Visit      High   Human immunodeficiency virus (HIV) disease ([place])    His infection is under excellent, long-term control.  He will continue Genvoya and follow-up after lab work in 6 months.  Genvoya does not have any adverse impact on his renal function.  If his renal function continues to deteriorate we will need to change Genvoya to its individual components at renally adjusted doses.      Relevant Orders   T-helper cell (CD4)- ([facility] clinic only)   HIV 1 RNA quant-no reflex-bld     Unprioritized   CKD (chronic kidney disease) stage 3, GFR 30-59 ml/min (HCC)    His CKD has been relatively stable over the past few years.      Essential hypertension    Jacob [name]'s blood pressure is above goal.  I encouraged him to continue daily measurements leading up to his visit with his primary care provider next week.           [name], MD [facility] [place] [facility] [phone] pager   [phone] cell 12/27/2017, 3:25 PM

## 2017-12-27 NOTE — Assessment & Plan Note (Signed)
His infection is under excellent, long-term control.  He will continue Genvoya and follow-up after lab work in 6 months.  Genvoya does not have any adverse impact on his renal function.  If his renal function continues to deteriorate we will need to change Genvoya to its individual components at renally adjusted doses.

## 2017-12-27 NOTE — Assessment & Plan Note (Signed)
Jacob [name]'s blood pressure is above goal.  I encouraged him to continue daily measurements leading up to his visit with his primary care provider next week.

## 2017-12-27 NOTE — Assessment & Plan Note (Signed)
His CKD has been relatively stable over the past few years.

## 2017-12-29 ENCOUNTER — Telehealth: Payer: Self-pay | Admitting: Cardiovascular Disease

## 2017-12-29 NOTE — Telephone Encounter (Signed)
New message    [employer] calledi stated patient needed an earlier appt due to swelling and bp being elevated, he refused earlier appt   Pt c/o swelling: STAT is pt has developed SOB within 24 hours  1) How much weight have you gained and in what time span? 7.8lb  Day before yesterday (Wednesday 3/6)  Patient states its because he was drinking beer  192-196? He thinks the scale is wrong.  2) If swelling, where is the swelling located? abdomin  3) Are you currently taking a fluid pill? Yes , he would like to take 2 lasik   4) Are you currently SOB?  A little when he is walking   5) Do you have a log of your daily weights (if so, list)? Insurance company has list , and his bp was 171.80  6) Have you gained 3 pounds in a day or 5 pounds in a week? 7.8 lbs  7) Have you traveled recently? no

## 2017-12-29 NOTE — Telephone Encounter (Signed)
He can but needs to see kidney doctor to have his BMET checked retaining fluid form his renal failure

## 2017-12-29 NOTE — Telephone Encounter (Signed)
Called patient about Dr. Kyla Balzarine recommendations. Patient stated he would call his kidney doctor.

## 2017-12-29 NOTE — Telephone Encounter (Signed)
Called patient back about his message. Patient stated he felt a little bloated and his weight was up 7.8 lbs, so patient took an extra dose of Lasix 40 mg. Patient wanted to know if he could start taking Lasix 40mg  twice daily. Informed patient that he needs to continue with Lasix 40 mg daily as prescribed. Will forward to Dr. [name] for advisement.

## 2018-01-01 DIAGNOSIS — H401132 Primary open-angle glaucoma, bilateral, moderate stage: Secondary | ICD-10-CM | POA: Diagnosis not present

## 2018-01-08 ENCOUNTER — Other Ambulatory Visit: Payer: Self-pay | Admitting: Internal Medicine

## 2018-01-08 ENCOUNTER — Other Ambulatory Visit: Payer: Self-pay | Admitting: Cardiovascular Disease

## 2018-01-08 ENCOUNTER — Other Ambulatory Visit: Payer: Medicare Other

## 2018-01-08 DIAGNOSIS — Z794 Long term (current) use of insulin: Secondary | ICD-10-CM | POA: Diagnosis not present

## 2018-01-08 DIAGNOSIS — E11628 Type 2 diabetes mellitus with other skin complications: Secondary | ICD-10-CM | POA: Diagnosis not present

## 2018-01-08 DIAGNOSIS — E782 Mixed hyperlipidemia: Secondary | ICD-10-CM

## 2018-01-08 LAB — LIPID PANEL
CHOLESTEROL: 170 mg/dL (ref <200)
HDL: 31 mg/dL — AB (ref >40)
NON-HDL CHOLESTEROL (CALC): 139 mg/dL — AB (ref <130)
TRIGLYCERIDES: 436 mg/dL — AB (ref <150)
Total CHOL/HDL Ratio: 5.5 (calc) — ABNORMAL HIGH (ref <5.0)

## 2018-01-08 LAB — COMPLETE METABOLIC PANEL WITH GFR
AG RATIO: 0.9 (calc) — AB (ref 1.0–2.5)
ALBUMIN MSPROF: 3.9 g/dL (ref 3.6–5.1)
ALT: 19 U/L (ref 9–46)
AST: 25 U/L (ref 10–35)
Alkaline phosphatase (APISO): 42 U/L (ref 40–115)
BILIRUBIN TOTAL: 0.4 mg/dL (ref 0.2–1.2)
BUN / CREAT RATIO: 17 (calc) (ref 6–22)
BUN: 53 mg/dL — ABNORMAL HIGH (ref 7–25)
CHLORIDE: 110 mmol/L (ref 98–110)
CO2: 22 mmol/L (ref 20–32)
Calcium: 9.2 mg/dL (ref 8.6–10.3)
Creat: 3.06 mg/dL — ABNORMAL HIGH (ref 0.70–1.25)
GFR, EST AFRICAN AMERICAN: 23 mL/min/{1.73_m2} — AB (ref >=60)
GFR, Est Non African American: 20 mL/min/{1.73_m2} — ABNORMAL LOW (ref >=60)
GLOBULIN: 4.4 g/dL — AB (ref 1.9–3.7)
Glucose, Bld: 66 mg/dL (ref 65–99)
POTASSIUM: 5.5 mmol/L — AB (ref 3.5–5.3)
Sodium: 137 mmol/L (ref 135–146)
TOTAL PROTEIN: 8.3 g/dL — AB (ref 6.1–8.1)

## 2018-01-08 MED ORDER — NITROGLYCERIN 0.3 MG SL SUBL: 0.3000 mg | SUBLINGUAL_TABLET | SUBLINGUAL | 0 refills | Status: DC | PRN

## 2018-01-09 LAB — HEMOGLOBIN A1C
Hgb A1c MFr Bld: 6.4 % of total Hgb — ABNORMAL HIGH (ref <5.7)
Mean Plasma Glucose: 137 (calc)
eAG (mmol/L): 7.6 (calc)

## 2018-01-10 ENCOUNTER — Ambulatory Visit: Payer: Medicare Other | Admitting: Internal Medicine

## 2018-01-21 ENCOUNTER — Other Ambulatory Visit: Payer: Self-pay | Admitting: Internal Medicine

## 2018-01-21 DIAGNOSIS — I10 Essential (primary) hypertension: Secondary | ICD-10-CM

## 2018-01-22 NOTE — Progress Notes (Signed)
Cardiology Office Note   Date:  01/25/2018   ID:  Jacob [name], DOB 19-Sep-1951, MRN [id number]  PCP:  Gildardo Cranker, DO  Cardiologist:  Dr. [name]    No chief complaint on file.     History of Present Illness:  67 y.o. CABG 2004 with ischemic DCM at one time EF 24%. Improved post CABG. He declined AICD. Last echo 11/10/16 EF 45-50% Myovue 2016 scar no ischemia Not ideal cath candidate due to CRF with baseline Cr 3.0 Also has history of HIV and Hepatitis C He has limited mobility with charcot joint left foot. Lasix increased last 6 months due to edema  No chest pain has f/u with [facility]  He is concerned that his K is up Has cramps in hands and feet. No CHF symptoms   Past Medical History:  Diagnosis Date  . Arthritis   . Cataracts, bilateral    immature  . CHF (congestive heart failure) (HCC)    not on any meds  . Chronic back pain    stenosis  . Chronic kidney disease, unspecified   . Coronary atherosclerosis of artery bypass graft   . Coronary atherosclerosis of native coronary artery 2005   s/p surgery  . Drug abuse ([place])    hx; tested for cocaine as recently as 2/08. says he is not using drugs now - avoided defib. for this reason   . GERD (gastroesophageal reflux disease)    takes OTC meds as needed  . Glaucoma    uses eye drops daily  . Gout, unspecified    takes Allopurinol daily as well as Colchicine-if needed  . Hepatitis 1967   Hep C  . History of colon polyps    benign  . HTN (hypertension)    takes Coreg,Imdur.and Apresoline daily  . Human immunodeficiency virus (HIV) disease ([place])    takes Genvoya daily  . Hyperlipidemia    takes Atorvastatin daily  . Ischemic cardiomyopathy   . Muscle spasm    takes Zanaflex as needed  . Nocturia   . Peripheral neuropathy    takes gabapentin daily  . Pneumonia    hx of  . Shortness of breath dyspnea    rarely but if notices it then with exertion  . Syphilis, unspecified   . Type II or  unspecified type diabetes mellitus without mention of complication, not stated as uncontrolled 2004   Lantus daily.Average fasting blood sugar 125-199    Past Surgical History:  Procedure Laterality Date  . BACK SURGERY    . CARDIAC SURGERY    . CHOLECYSTECTOMY    . COLONOSCOPY  2013   Dr.John Henrene Pastor   . CORONARY ARTERY BYPASS GRAFT    . laproscopic cholecystectomy  8/07  . left intertrochanteric hip fracture  2004   s/p intermedullary nail placement 2/08  . left transverse mandibular fracture  10/05  . LUMBAR LAMINECTOMY/DECOMPRESSION MICRODISCECTOMY N/A 02/29/2016   Procedure: Left L4-5 Lateral Recess Decompression, Removal Extradural Intraspinal Facet Cyst;  Surgeon: Marybelle Killings, MD;  Location: [facility];  Service: Orthopedics;  Laterality: N/A;     Current Outpatient Medications  Medication Sig Dispense Refill  . allopurinol (ZYLOPRIM) 100 MG tablet Take 1 tablet (100 mg total) by mouth daily. 30 tablet 5  . ANORO ELLIPTA 62.5-25 MCG/INH AEPB INHALE 1 PUFF BY MOUTH ONCE DAILY 60 each 3  . aspirin EC 81 MG tablet Take 81 mg by mouth daily.    [name] atorvastatin (LIPITOR) 20 MG tablet take  1 tablet by mouth once daily 30 tablet 11  . B-D UF III MINI PEN NEEDLES 31G X 5 MM MISC use four times a day 100 each 11  . Blood Glucose Monitoring Suppl (ONE TOUCH ULTRA MINI) w/Device KIT 1 each by Does not apply route 2 (two) times daily. Dx: E11.40 1 each 0  . carvedilol (COREG) 25 MG tablet TAKE 1 TABLET BY MOUTH TWICE A DAY WITH MEALS 180 tablet 1  . Choline Fenofibrate (FENOFIBRIC ACID) 135 MG CPDR take 1 capsule by mouth once daily 30 capsule 6  . colchicine 0.6 MG tablet take 1 tablet by mouth CONTINUOUS AS NEEDED FOR GOUT 30 tablet 2  . furosemide (LASIX) 40 MG tablet Take 40 mg by mouth daily.    [name] gabapentin (NEURONTIN) 300 MG capsule TAKE 1 CAPSULE BY MOUTH THREE TIMES A DAY 90 capsule 5  . GENVOYA 150-150-200-10 MG TABS tablet TAKE ONE TABLET BY MOUTH ONCE DAILY WITH BREAKFAST. STORE IN  ORIGINALCONTAINER AT ROOM TEMPERATURE. 90 tablet 1  . Glucosamine-Chondroit-Vit C-Mn (GLUCOSAMINE CHONDR 1500 COMPLX PO) Take 2 tablets by mouth daily. Reported on 04/04/2016    . glucose blood test strip Use to test blood sugar twice daily. E11.40 200 each 3  . HUMALOG KWIKPEN 100 UNIT/ML KiwkPen INJECT 12 UNITS SUBCUTANEOUSLY THREE TIMES A DAY BEFORE MEALS 33 mL 3  . hydrALAZINE (APRESOLINE) 25 MG tablet TAKE 1 TABLET BY MOUTH THREE TIMES DAILY 270 tablet 1  . isosorbide mononitrate (IMDUR) 30 MG 24 hr tablet take 1 tablet by mouth once daily 90 tablet 3  . LANTUS SOLOSTAR 100 UNIT/ML Solostar Pen INJECT 70 UNITS SUBCUTANEOUSLY EVERY MORNING (35 UNITS ON EACH SIDE) 15 mL 6  . latanoprost (XALATAN) 0.005 % ophthalmic solution Place 1 drop into both eyes at bedtime. Reported on 04/04/2016    . Naftifine HCl (NAFTIN) 2 % CREA Apply to affected area twice daily. 1 Tube 3  . nitroGLYCERIN (NITROSTAT) 0.3 MG SL tablet Place 1 tablet (0.3 mg total) under the tongue as needed for chest pain (not to exceed 3 tablets in one day.). 75 tablet 0   No current facility-administered medications for this visit.     Allergies:   Amphetamines    Social History:  The patient  reports that he has been smoking cigarettes.  He has been smoking about 0.00 packs per day for the past 41.00 years. He has never used smokeless tobacco. He reports that he drinks alcohol. He reports that he does not use drugs.   Family History:  The patient's family history includes Alzheimer's disease in his mother and sister; Diabetes in his brother, brother, and sister; Drug abuse in his brother; Heart attack in his brother; Heart failure in his father; Hypertension in his brother and father; Stroke in his sister.    ROS:  General:no colds or fevers,  weight down 3 lbs by our scales but at home back to baseline Skin:no rashes or ulcers HEENT:no blurred vision, no congestion CV:see HPI PUL:see HPI GI:no diarrhea constipation or  melena, no indigestion GU:no hematuria, no dysuria MS:no joint pain, no claudication Neuro:no syncope, no lightheadedness Endo:+ diabetes glucose stable, no thyroid disease  Wt Readings from Last 3 Encounters:  01/25/18 202 lb (91.6 kg)  12/27/17 204 lb 4 oz (92.6 kg)  11/06/17 200 lb (90.7 kg)     PHYSICAL EXAM: VS:  BP (!) 154/78   Pulse 82   Ht '6\' 2"'  (1.88 m)   Wt 202 lb (91.6  kg)   SpO2 98%   BMI 25.94 kg/m  , BMI Body mass index is 25.94 kg/m. Affect appropriate Healthy:  appears stated age 61: normal Neck supple with no adenopathy JVP normal no bruits no thyromegaly Lungs clear with no wheezing and good diaphragmatic motion Heart:  S1/S2 no murmur, no rub, gallop or click PMI normal Abdomen: benighn, BS positve, no tenderness, no AAA no bruit.  No HSM or HJR Distal pulses intact with no bruits No edema Neuro non-focal Skin warm and dry No muscular weakness     EKG:   10/21/17 SR rate 77 PVC possible old IMI lateral T wave inversion    Recent Labs: 07/18/2017: TSH 0.81 12/12/2017: Hemoglobin 15.0; Platelets 156 01/08/2018: ALT 19; BUN 53; Creat 3.06; Potassium 5.5; Sodium 137    Lipid Panel    Component Value Date/Time   CHOL 170 01/08/2018 0827   CHOL 121 08/07/2015 1041   TRIG 436 (H) 01/08/2018 0827   HDL 31 (L) 01/08/2018 0827   HDL 32 (L) 08/07/2015 1041   CHOLHDL 5.5 (H) 01/08/2018 0827   VLDL 48 (H) 03/15/2017 0924   [place]  01/08/2018 0827     Comment:     . LDL cholesterol not calculated. Triglyceride levels greater than 400 mg/dL invalidate calculated LDL results. . Reference range: <100 . Desirable range <100 mg/dL for primary prevention;   <70 mg/dL for patients with CHD or diabetic patients  with > or = 2 CHD risk factors. [name] LDL-C is now calculated using the Martin-Hopkins  calculation, which is a validated novel method providing  better accuracy than the Friedewald equation in the  estimation of LDL-C.  Cresenciano Genre et al.  Annamaria Helling. 0964;383(81): 2061-2068  (http://education.[facility].com/faq/FAQ164)        Other studies Reviewed: Additional studies/ records that were reviewed today include: recent OV note.  ECHO 11/10/2016 ------------------------------------------------------------------- Study Conclusions  - Left ventricle: Septal and inferior wall hypokinesis The cavity   size was mildly dilated. There was moderate concentric   hypertrophy. Systolic function was mildly reduced. The estimated   ejection fraction was in the range of 45% to 50%. Doppler   parameters are consistent with both elevated ventricular   end-diastolic filling pressure and elevated left atrial filling   pressure. - Left atrium: The atrium was mildly dilated. - Atrial septum: No defect or patent foramen ovale was identified.  ASSESSMENT AND PLAN:  1.  Acute on chronic systolic and diastolic HF improved lasix should be dosed by nephrology Continue BB, no ACE due to kidney disease.     2. CAD no angina, stable.myovue 2016 not ischemic Distant CABG 2004. Cath not pursued as EF has improved last myovue not ischemic and has CRF   3. DM:  Discussed low carb diet.  Target hemoglobin A1c is 6.5 or less.  Continue current medications.  4. CRF:  Baseline Cr around 3.0  gets worse on higher dose lasix F/U nephrology try to keep lasix at 40 mg daily no ACE Will check BMET today and f/u Dr [name] renal   5. Cholesterol:  On statin labs with primary   6. HTN:  Well controlled.  Continue current medications and low sodium Dash type diet.       Jenkins Rouge, MD

## 2018-01-23 ENCOUNTER — Other Ambulatory Visit: Payer: Self-pay | Admitting: Internal Medicine

## 2018-01-24 ENCOUNTER — Ambulatory Visit: Payer: Medicare Other | Admitting: Cardiovascular Disease

## 2018-01-24 ENCOUNTER — Other Ambulatory Visit: Payer: Self-pay | Admitting: Internal Medicine

## 2018-01-24 ENCOUNTER — Telehealth: Payer: Self-pay

## 2018-01-24 DIAGNOSIS — I10 Essential (primary) hypertension: Secondary | ICD-10-CM

## 2018-01-24 NOTE — Telephone Encounter (Signed)
Patient called to follow-up on message he left yesterday. I informed patient that unfortunately a message never came through from is yesterday.  1.) Patient c/o gas, stomach pain and cramping. Trying Miralax today  2.) Back and knee pain, not related to an injury 3.) Bad taste in mouth   Patient questions if the above symptoms are a side effect of his potassium level being abnormal. I offered patient appointment today and patient refused for he already has an appointment on Friday with Dr.Carter. Patient would like any recommendations that you can give prior to appointment  Please advise

## 2018-01-24 NOTE — Telephone Encounter (Signed)
Unfortunately, he needs to be seen prior to any advice given - recommend urgent care if he cannot wait until office appt

## 2018-01-24 NOTE — Telephone Encounter (Signed)
Left message on voicemail with Dr.Carter's response, ok per DPR. Patient to call if he would like to be seen sooner

## 2018-01-25 ENCOUNTER — Encounter: Payer: Self-pay | Admitting: Cardiovascular Disease

## 2018-01-25 ENCOUNTER — Telehealth: Payer: Self-pay | Admitting: Cardiovascular Disease

## 2018-01-25 ENCOUNTER — Ambulatory Visit (INDEPENDENT_AMBULATORY_CARE_PROVIDER_SITE_OTHER): Payer: Medicare Other | Admitting: Cardiovascular Disease

## 2018-01-25 VITALS — BP 154/78 | HR 82 | Ht 74.0 in | Wt 202.0 lb

## 2018-01-25 DIAGNOSIS — Z951 Presence of aortocoronary bypass graft: Secondary | ICD-10-CM | POA: Diagnosis not present

## 2018-01-25 DIAGNOSIS — I5043 Acute on chronic combined systolic (congestive) and diastolic (congestive) heart failure: Secondary | ICD-10-CM | POA: Diagnosis not present

## 2018-01-25 LAB — BASIC METABOLIC PANEL
BUN/Creatinine Ratio: 20 (ref 10–24)
BUN: 72 mg/dL — AB (ref 8–27)
CALCIUM: 8.8 mg/dL (ref 8.6–10.2)
CHLORIDE: 105 mmol/L (ref 96–106)
CO2: 17 mmol/L — ABNORMAL LOW (ref 20–29)
CREATININE: 3.65 mg/dL — AB (ref 0.76–1.27)
GFR calc non Af Amer: 16 mL/min/{1.73_m2} — ABNORMAL LOW (ref >59)
GFR, EST AFRICAN AMERICAN: 18 mL/min/{1.73_m2} — AB (ref >59)
Glucose: 205 mg/dL — ABNORMAL HIGH (ref 65–99)
Potassium: 5.6 mmol/L — ABNORMAL HIGH (ref 3.5–5.2)
Sodium: 134 mmol/L (ref 134–144)

## 2018-01-25 NOTE — Telephone Encounter (Signed)
Informed patient to call his PCP or go to urgent care about his diarrhea. Patient verbalized understanding.

## 2018-01-25 NOTE — Telephone Encounter (Signed)
New Message  Pt calling and states his diarrhea has gotten worse and wants to know what he should do. Please call

## 2018-01-25 NOTE — Patient Instructions (Addendum)
Medication Instructions:  Your physician recommends that you continue on your current medications as directed. Please refer to the Current Medication list given to you today.  Labwork: Your physician recommends that you have lab work today- BMET   Testing/Procedures: NONE  Follow-Up: Your physician wants you to follow-up in: 6 months with Dr. [name]. You will receive a reminder letter in the mail two months in advance. If you don't receive a letter, please call our office to schedule the follow-up appointment.   If you need a refill on your cardiac medications before your next appointment, please call your pharmacy.

## 2018-01-26 ENCOUNTER — Ambulatory Visit (INDEPENDENT_AMBULATORY_CARE_PROVIDER_SITE_OTHER): Payer: Medicare Other | Admitting: Internal Medicine

## 2018-01-26 ENCOUNTER — Encounter: Payer: Self-pay | Admitting: Internal Medicine

## 2018-01-26 ENCOUNTER — Telehealth: Payer: Self-pay | Admitting: Cardiovascular Disease

## 2018-01-26 VITALS — BP 112/70 | HR 70 | Temp 98.4°F | Resp 10 | Ht 74.0 in | Wt 200.0 lb

## 2018-01-26 DIAGNOSIS — Z720 Tobacco use: Secondary | ICD-10-CM

## 2018-01-26 DIAGNOSIS — Z794 Long term (current) use of insulin: Secondary | ICD-10-CM | POA: Diagnosis not present

## 2018-01-26 DIAGNOSIS — E1122 Type 2 diabetes mellitus with diabetic chronic kidney disease: Secondary | ICD-10-CM

## 2018-01-26 DIAGNOSIS — N184 Chronic kidney disease, stage 4 (severe): Secondary | ICD-10-CM

## 2018-01-26 DIAGNOSIS — B20 Human immunodeficiency virus [HIV] disease: Secondary | ICD-10-CM | POA: Diagnosis not present

## 2018-01-26 DIAGNOSIS — I10 Essential (primary) hypertension: Secondary | ICD-10-CM

## 2018-01-26 DIAGNOSIS — E1140 Type 2 diabetes mellitus with diabetic neuropathy, unspecified: Secondary | ICD-10-CM

## 2018-01-26 DIAGNOSIS — E782 Mixed hyperlipidemia: Secondary | ICD-10-CM | POA: Diagnosis not present

## 2018-01-26 NOTE — Telephone Encounter (Signed)
Patient aware of lab results.

## 2018-01-26 NOTE — Telephone Encounter (Signed)
New Message ° ° ° °Patient is returning call in reference to labs. Please call.  °

## 2018-01-26 NOTE — Progress Notes (Signed)
[name], male   DOB: 16-Sep-1951, 67 y.o.   MRN: [id number]   Location:  [facility] OFFICE  Provider: DR Arletha Grippe  Code Status:  Goals of Care:  Advanced Directives 01/26/2018  Does Patient Have a Medical Advance Directive? No;Yes  Type of Advance Directive Living will  Does patient want to make changes to medical advance directive? -  Copy of [place] in Chart? -  Would patient like information on creating a medical advance directive? -     Chief Complaint  Patient presents with  . Medical Management of Chronic Issues    3 month follow-up, discuss home visit- ? if colonoscopy due, per Dr.Perry recheck in 10 years. Patient c/o diarrhea and back pain off/on  . Immunizations    Discuss need for pneumonia vaccine     HPI: Patient is a 67 y.o. male seen today for medical management of chronic diseases. He reports diarrhea improved since Wednesday April 3rd. He believes the white liquor he consumed prior to onset diarrhea was the cause of GI problems. He has not had any in 2weeks. He was seen by cardio yesterday and K 5.6; Cr 3.65 . He will see nephrology Dr [name] next month. Weight down 4 lbs on 19m lasix daily.   He is UTD on vaccines  Tob abuse - still smoking but down to < 1/2 ppd.    HIV - stable on HAART tx (genvoya). HIV1 RNA quant <20; log <1.3. CD4 abs 860. Treponemal Abs reactive RPR titer 1:1. Followed by ID Dr CMegan Salonannually  HTN/CHF - stable on hydralazine, coreg, imdur. Followed by Dr NJohnsie Cancel  DM - controlled. He takes 1/2 dose lantus on each side of abdomen.  No low BS reactions. Numbness/tingling in feet occasionally. Takes Lantus  insulin 70 units qhs and humalog 12 units qAC. Followed by podiatry. No ulcerations but has calluses. A1c 6.4%. He is not on ARB due to AKI and hyperkalemia. He has an eye specialist. He has diabetic preulcerative calluses and bunions and is followed by podiatry. Urine microalbumin/Cr ratio 1937 (prev  1142.6).  Hyperlipidemia - uncontrolled. Takes lipitor. LDL 104 (few mos ago); TG 436; HDL 31  CKD  - stage 4. Cr 3.65. Followed by nephrology Dr UHollie Salk Past Medical History:  Diagnosis Date  . Arthritis   . Cataracts, bilateral    immature  . CHF (congestive heart failure) (HCC)    not on any meds  . Chronic back pain    stenosis  . Chronic kidney disease, unspecified   . Coronary atherosclerosis of artery bypass graft   . Coronary atherosclerosis of native coronary artery 2005   s/p surgery  . Drug [place]    hx; tested for cocaine as recently as 2/08. says he is not using drugs now - avoided defib. for this reason   . GERD (gastroesophageal reflux disease)    takes OTC meds as needed  . Glaucoma    uses eye drops daily  . Gout, unspecified    takes Allopurinol daily as well as Colchicine-if needed  . Hepatitis 1967   Hep C  . History of colon polyps    benign  . HTN (hypertension)    takes Coreg,Imdur.and Apresoline daily  . [place] virus (HIV) disease [place]    takes Genvoya daily  . Hyperlipidemia    takes Atorvastatin daily  . Ischemic cardiomyopathy   . Muscle spasm    takes Zanaflex as needed  .  Nocturia   . Peripheral neuropathy    takes gabapentin daily  . Pneumonia    hx of  . Shortness of breath dyspnea    rarely but if notices it then with exertion  . Syphilis, unspecified   . Type II or unspecified type diabetes mellitus without mention of complication, not stated as uncontrolled 2004   Lantus daily.Average fasting blood sugar 125-199    Past Surgical History:  Procedure Laterality Date  . BACK SURGERY    . CARDIAC SURGERY    . CHOLECYSTECTOMY    . COLONOSCOPY  2013   Dr.John Henrene Pastor   . CORONARY ARTERY BYPASS GRAFT    . laproscopic cholecystectomy  8/07  . left intertrochanteric hip fracture  2004   s/p intermedullary nail placement 2/08  . left transverse mandibular fracture  10/05  . LUMBAR LAMINECTOMY/DECOMPRESSION  MICRODISCECTOMY N/A 02/29/2016   Procedure: Left L4-5 Lateral Recess Decompression, Removal Extradural Intraspinal Facet Cyst;  Surgeon: Marybelle Killings, MD;  Location: [place];  Service: Orthopedics;  Laterality: N/A;     reports that he has been smoking cigarettes.  He has been smoking about 0.00 packs per day for the past 41.00 years. He has never used smokeless tobacco. He reports that he drinks alcohol. He reports that he does not use drugs. Social History   Socioeconomic History  . Marital status: Single    Spouse name: Not on file  . Number of children: 1  . Years of education: Not on file  . Highest education level: Not on file  Occupational History  . Occupation: retired  . Occupation: part time- [employer]  . Financial resource strain: Not on file  . Food insecurity:    Worry: Not on file    Inability: Not on file  . Transportation needs:    Medical: Not on file    Non-medical: Not on file  Tobacco Use  . Smoking status: Current Every Day Smoker    Packs/day: 0.00    Years: 41.00    Pack years: 0.00    Types: Cigarettes  . Smokeless tobacco: Never Used  . Tobacco comment: States started smoking again today  Substance and Sexual Activity  . Alcohol use: Yes    Comment: occasionally  . Drug use: No    Types: Cocaine    Comment: hx of crack/cocaine 48yr ago  . Sexual activity: Not Currently    Comment: declined condoms  Lifestyle  . Physical activity:    Days per week: Not on file    Minutes per session: Not on file  . Stress: Not on file  Relationships  . Social connections:    Talks on phone: Not on file    Gets together: Not on file    Attends religious service: Not on file    Active member of club or organization: Not on file    Attends meetings of clubs or organizations: Not on file    Relationship status: Not on file  . Intimate partner violence:    Fear of current or ex partner: Not on file    Emotionally abused: Not on file    Physically  abused: Not on file    Forced sexual activity: Not on file  Other Topics Concern  . Not on file  Social History Narrative   Retired.       As of 05/2015:   Diet: No salt    Caffeine   Married: Single    House: Condo, 2  stories, 1 person (self)    Pets: No    Current/Past profession: N/A   Exercise: walks daily    Living Will: Yes    DNR   POA/HPOA: No        Family History  Problem Relation Age of Onset  . Heart failure Father   . Hypertension Father   . Diabetes Brother   . Heart attack Brother   . Alzheimer's disease Mother   . Stroke Sister   . Diabetes Sister   . Alzheimer's disease Sister   . Hypertension Brother   . Diabetes Brother   . Drug abuse Brother   . Colon cancer Neg Hx     Allergies  Allergen Reactions  . Amphetamines Other (See Comments)    Pt is unaware of this -     Outpatient Encounter Medications as of 01/26/2018  Medication Sig  . allopurinol (ZYLOPRIM) 100 MG tablet Take 1 tablet (100 mg total) by mouth daily.  Jearl Klinefelter ELLIPTA 62.5-25 MCG/INH AEPB INHALE 1 PUFF BY MOUTH ONCE DAILY  . aspirin EC 81 MG tablet Take 81 mg by mouth daily.  [name] atorvastatin (LIPITOR) 20 MG tablet take 1 tablet by mouth once daily  . B-D UF III MINI PEN NEEDLES 31G X 5 MM MISC use four times a day  . Blood Glucose Monitoring Suppl (ONE TOUCH ULTRA MINI) w/Device KIT 1 each by Does not apply route 2 (two) times daily. Dx: E11.40  . carvedilol (COREG) 25 MG tablet TAKE 1 TABLET BY MOUTH TWICE A DAY WITH MEALS  . Choline Fenofibrate (FENOFIBRIC ACID) 135 MG CPDR take 1 capsule by mouth once daily  . colchicine 0.6 MG tablet take 1 tablet by mouth CONTINUOUS AS NEEDED FOR GOUT  . furosemide (LASIX) 40 MG tablet Take 40 mg by mouth daily.  [name] gabapentin (NEURONTIN) 300 MG capsule TAKE 1 CAPSULE BY MOUTH THREE TIMES A DAY  . GENVOYA 150-150-200-10 MG TABS tablet TAKE ONE TABLET BY MOUTH ONCE DAILY WITH BREAKFAST. STORE IN ORIGINALCONTAINER AT ROOM TEMPERATURE.  .  Glucosamine-Chondroit-Vit C-Mn (GLUCOSAMINE CHONDR 1500 COMPLX PO) Take 2 tablets by mouth daily. Reported on 04/04/2016  . glucose blood test strip Use to test blood sugar twice daily. E11.40  . HUMALOG KWIKPEN 100 UNIT/ML KiwkPen INJECT 12 UNITS SUBCUTANEOUSLY THREE TIMES A DAY BEFORE MEALS  . hydrALAZINE (APRESOLINE) 25 MG tablet TAKE 1 TABLET BY MOUTH THREE TIMES DAILY  . isosorbide mononitrate (IMDUR) 30 MG 24 hr tablet take 1 tablet by mouth once daily  . LANTUS SOLOSTAR 100 UNIT/ML Solostar Pen INJECT 70 UNITS SUBCUTANEOUSLY EVERY MORNING (35 UNITS ON EACH SIDE)  . latanoprost (XALATAN) 0.005 % ophthalmic solution Place 1 drop into both eyes at bedtime. Reported on 04/04/2016  . Naftifine HCl (NAFTIN) 2 % CREA Apply to affected area twice daily.  . nitroGLYCERIN (NITROSTAT) 0.3 MG SL tablet Place 1 tablet (0.3 mg total) under the tongue as needed for chest pain (not to exceed 3 tablets in one day.).  . [DISCONTINUED] calcium carbonate (OS-CAL) 600 MG TABS Take 600 mg by mouth 2 (two) times daily with a meal.     No facility-administered encounter medications on file as of 01/26/2018.     Review of Systems:  Review of Systems  Musculoskeletal: Positive for arthralgias.  All other systems reviewed and are negative.   Health Maintenance  Topic Date Due  . PNA vac Low Risk Adult (2 of 2 - PPSV23) 08/18/2017  . HEMOGLOBIN A1C  04/10/2018  .  INFLUENZA VACCINE  05/24/2018  . OPHTHALMOLOGY EXAM  06/30/2018  . FOOT EXAM  10/11/2018  . LIPID PANEL  01/09/2019  . TETANUS/TDAP  07/27/2021  . COLONOSCOPY  10/01/2022  . Hepatitis C Screening  Completed    Physical Exam: Vitals:   01/26/18 1159  BP: 112/70  Pulse: 70  Resp: 10  Temp: 98.4 F (36.9 C)  TempSrc: Oral  SpO2: 97%  Weight: 200 lb (90.7 kg)  Height: '6\' 2"'  (1.88 m)   Body mass index is 25.68 kg/m. Physical Exam  Constitutional: He is oriented to person, place, and time. He appears well-developed and well-nourished.    HENT:  Mouth/Throat: Oropharynx is clear and moist.  MMM; no oral thrush  Eyes: Pupils are equal, round, and reactive to light. No scleral icterus.  Neck: Neck supple. Carotid bruit is not present. No thyromegaly present.  Cardiovascular: Normal rate, regular rhythm and intact distal pulses. Exam reveals no gallop and no friction rub.  Murmur (1/6 SEM) heard. No distal LE edema. No calf TTP  Pulmonary/Chest: Effort normal and breath sounds normal. He has no wheezes. He has no rales. He exhibits no tenderness.  Abdominal: Soft. Normal appearance and bowel sounds are normal. He exhibits no distension, no abdominal bruit, no pulsatile midline mass and no mass. There is no hepatomegaly. There is no tenderness. There is no rigidity, no rebound and no guarding. No hernia.  obese  Musculoskeletal: He exhibits edema.  Lymphadenopathy:    He has no cervical adenopathy.  Neurological: He is alert and oriented to person, place, and time.  Skin: Skin is warm and dry. No rash noted.  Psychiatric: He has a normal mood and affect. His behavior is normal. Judgment and thought content normal.    Labs reviewed: Basic Metabolic Panel: Recent Labs    07/18/17 0828 12/12/17 1006 01/08/18 0827 01/25/18 1126  NA 139 137 137 134  K 5.5* 5.5* 5.5* 5.6*  CL 111* 111* 110 105  CO2 24 16* 22 17*  GLUCOSE 73 96 66 205*  BUN 44* 51* 53* 72*  CREATININE 2.79* 3.20* 3.06* 3.65*  CALCIUM 9.5 9.3 9.2 8.8  TSH 0.81  --   --   --    Liver Function Tests: Recent Labs    03/15/17 0924 06/27/17 1028 07/18/17 0828 01/08/18 0827  AST '25 24 22 25  ' ALT '20 19 16 19  ' ALKPHOS 65 54  --   --   BILITOT 0.3 0.3 0.4 0.4  PROT 7.5 7.4 7.8 8.3*  ALBUMIN 3.6 3.6  --   --    No results for input(s): LIPASE, AMYLASE in the last 8760 hours. No results for input(s): AMMONIA in the last 8760 hours. CBC: Recent Labs    06/27/17 1028 12/12/17 1006  WBC 5.1 4.6  HGB 14.2 15.0  HCT 42.0 44.3  MCV 95.5 94.5  PLT  179 156   Lipid Panel: Recent Labs    03/15/17 0924 07/18/17 0828 10/09/17 0821 01/08/18 0827  CHOL 178 177 211* 170  HDL 30* 34* 39* 31*  LDLCALC 100* 104* 130*  --   TRIG 239* 295* 297* 436*  CHOLHDL 5.9* 5.2* 5.4* 5.5*   Lab Results  Component Value Date   HGBA1C 6.4 (H) 01/08/2018    Procedures since last visit: No results found.  Assessment/Plan     ICD-10-CM   1. CKD stage 4 due to type 2 diabetes mellitus (HCC) E11.22    N18.4   2. Type 2 diabetes mellitus  with diabetic neuropathy, with long-term current use of insulin (HCC) E11.40    Z79.4   3. Mixed hyperlipidemia E78.2   4. Essential hypertension I10   5. Human immunodeficiency virus (HIV) disease ([place]) B20   6. Tobacco abuse Z72.0    REDUCE FUROSEMIDE (LASIX) TO 40MG DAILY  Repeat lab on 01/29/18 - PLEASE SCHEDULE LAB APPT  Continue other medications as ordered. Sample of [place] given  Follow up with specialists as scheduled  Follow up with Tivis Ringer as scheduled  Follow up in 3 mos for DM, HTN, tob abuse  Willa Brocks S. Perlie Gold  [facility] [address] [place], [place] [address] [phone] Cell (Monday-Friday 8 AM - 5 PM) [phone] After 5 PM and follow prompts

## 2018-01-26 NOTE — Patient Instructions (Addendum)
REDUCE FUROSEMIDE (LASIX) TO 40MG  DAILY  Repeat lab on 01/29/18 - PLEASE SCHEDULE LAB APPT  Continue other medications as ordered  Follow up with specialists as scheduled  Follow up with Tivis Ringer as scheduled  Follow up in 3 mos for DM, HTN, tob abuse

## 2018-02-05 ENCOUNTER — Ambulatory Visit (INDEPENDENT_AMBULATORY_CARE_PROVIDER_SITE_OTHER): Payer: Medicare Other | Admitting: Pharmacotherapy

## 2018-02-05 ENCOUNTER — Encounter: Payer: Self-pay | Admitting: Pharmacotherapy

## 2018-02-05 VITALS — BP 138/72 | HR 61 | Resp 10 | Ht 74.0 in | Wt 206.0 lb

## 2018-02-05 DIAGNOSIS — I10 Essential (primary) hypertension: Secondary | ICD-10-CM

## 2018-02-05 DIAGNOSIS — Z794 Long term (current) use of insulin: Secondary | ICD-10-CM

## 2018-02-05 DIAGNOSIS — E1122 Type 2 diabetes mellitus with diabetic chronic kidney disease: Secondary | ICD-10-CM | POA: Diagnosis not present

## 2018-02-05 DIAGNOSIS — N184 Chronic kidney disease, stage 4 (severe): Secondary | ICD-10-CM

## 2018-02-05 DIAGNOSIS — E1140 Type 2 diabetes mellitus with diabetic neuropathy, unspecified: Secondary | ICD-10-CM | POA: Diagnosis not present

## 2018-02-05 NOTE — Patient Instructions (Signed)
Make sure he has a follow up with Dr [name] in the next 3 months.

## 2018-02-05 NOTE — Progress Notes (Signed)
  Subjective:    Jacob [name] is a 67 y.o.African American male who presents for follow-up of Type 2 diabetes mellitus.   A1C: 6.4% (01/08/18) EGFR: 16 ml/min  Lantus 70 units daily Humalog 12 units with each meal.  BG: 95-202 (average '130mg'$ /dl) No hypoglycemia  Denies missed insulin doses Trying to make healthy food choices.  Limiting saturated food. Walking for exercise daily. Blister on toes have healed.  Does have callus on right foot. No peripheral edema. New glasses. Nocturia 0-1 times per night. No dysuria Staying well hydrated.  Next nephrology appointment is 03/09/18. Now doing furosemide '40mg'$  daily per Dr Vale Haven last order.  Review of Systems A comprehensive review of systems was negative except for: Eyes: positive for contacts/glasses Gastrointestinal: positive for change in bowel habits Genitourinary: positive for nocturia    Objective:    BP 138/72   Pulse 61   Resp 10   Ht '6\' 2"'$  (1.88 m)   Wt 206 lb (93.4 kg)   SpO2 97%   BMI 26.45 kg/m   General:  alert, cooperative and no distress  Oropharynx: normal findings: lips normal without lesions and gums healthy   Eyes:  negative findings: lids and lashes normal and conjunctivae and sclerae normal   Ears:  external ears normal        Lung: clear to auscultation bilaterally  Heart:  regular rate and rhythm     Extremities: extremities normal, atraumatic, no cyanosis or edema  Skin: warm and dry, no hyperpigmentation, vitiligo, or suspicious lesions     Neuro: mental status, speech normal, alert and oriented x3 and gait and station normal   Lab Review Glucose (mg/dL)  Date Value  01/25/2018 205 (H)   Glucose, Bld (mg/dL)  Date Value  01/08/2018 66  12/12/2017 96  07/18/2017 73   CO2 (mmol/L)  Date Value  01/25/2018 17 (L)  01/08/2018 22  12/12/2017 16 (L)   BUN (mg/dL)  Date Value  01/25/2018 72 (H)  01/08/2018 53 (H)  12/12/2017 51 (H)  07/18/2017 44 (H)  02/08/2017 55 (H)   01/30/2017 49 (H)   Creat (mg/dL)  Date Value  01/08/2018 3.06 (H)  12/12/2017 3.20 (H)  07/18/2017 2.79 (H)   Creatinine, Ser (mg/dL)  Date Value  01/25/2018 3.65 (H)       Assessment:    Diabetes Mellitus type II, under excellent control. A1C at goal <7% BP above target <130/80 CKD - eGFR: 42mmin   Plan:    1.  Rx changes: none  2.  Continue Lantus 70 units daily 3.  Continue Humalog 12 units with each meal. 4.  Encouraged routine exercise.  Goal is 30-45 minutes 5 x week. 5.  BP slightly above target.  Will continue carvedilol, hydralazine, and furosemide. 6.  CKD - has nephrology appt next month 7.  Be sure to schedule follow up with Dr CEulas Post

## 2018-02-13 ENCOUNTER — Other Ambulatory Visit: Payer: Self-pay | Admitting: *Deleted

## 2018-02-13 MED ORDER — GLUCOSE BLOOD VI STRP: ORAL_STRIP | 3 refills | Status: DC

## 2018-02-13 NOTE — Telephone Encounter (Signed)
[facility] 

## 2018-02-20 ENCOUNTER — Other Ambulatory Visit: Payer: Self-pay | Admitting: Internal Medicine

## 2018-02-21 ENCOUNTER — Telehealth: Payer: Self-pay | Admitting: Podiatry

## 2018-02-21 ENCOUNTER — Ambulatory Visit (INDEPENDENT_AMBULATORY_CARE_PROVIDER_SITE_OTHER): Payer: Medicare Other | Admitting: Podiatry

## 2018-02-21 ENCOUNTER — Ambulatory Visit (INDEPENDENT_AMBULATORY_CARE_PROVIDER_SITE_OTHER): Payer: Medicare Other

## 2018-02-21 ENCOUNTER — Encounter: Payer: Self-pay | Admitting: Podiatry

## 2018-02-21 DIAGNOSIS — I70235 Atherosclerosis of native arteries of right leg with ulceration of other part of foot: Secondary | ICD-10-CM

## 2018-02-21 DIAGNOSIS — L97512 Non-pressure chronic ulcer of other part of right foot with fat layer exposed: Secondary | ICD-10-CM | POA: Diagnosis not present

## 2018-02-21 DIAGNOSIS — E0842 Diabetes mellitus due to underlying condition with diabetic polyneuropathy: Secondary | ICD-10-CM

## 2018-02-21 MED ORDER — SULFAMETHOXAZOLE-TRIMETHOPRIM 800-160 MG PO TABS: 1.0000 | ORAL_TABLET | Freq: Two times a day (BID) | ORAL | 0 refills | Status: DC

## 2018-02-21 MED ORDER — GENTAMICIN SULFATE 0.1 % EX CREA: 1.0000 "application " | TOPICAL_CREAM | Freq: Three times a day (TID) | CUTANEOUS | 1 refills | Status: DC

## 2018-02-21 NOTE — Telephone Encounter (Signed)
I have a question about the bandage Dr. Amalia Hailey' nurse put around my foot today. Please give me a call back at [phone]. Thank you.

## 2018-02-21 NOTE — Telephone Encounter (Signed)
Pt states dr. Amalia Hailey nurse wrapped the foot and he wanted to know if he had to wrap as the nurse or if could use a bandaid. I told pt that I did not like tape or bandaids they can pull or tear skin, he should use gauze and roll gauze and tape to the gauze only. Pt states the nurse gave him the self sticking tape and he had been using bandaids. I told pt if he had been using the bandaid without problem he could continue or alternate with the gauze. Pt states understanding.

## 2018-02-23 ENCOUNTER — Other Ambulatory Visit: Payer: Self-pay | Admitting: *Deleted

## 2018-02-23 MED ORDER — COLCHICINE 0.6 MG PO TABS: ORAL_TABLET | ORAL | 2 refills | Status: DC

## 2018-02-23 NOTE — Telephone Encounter (Signed)
Refill Request from [facility]  Very High Warning pop up. Pended Rx and sent to Dr. [name] for approval.

## 2018-02-24 LAB — WOUND CULTURE
MICRO NUMBER: 90531632
SPECIMEN QUALITY: ADEQUATE

## 2018-02-26 NOTE — Progress Notes (Signed)
Subjective:  67 year old male presenting today for follow up evaluation of an ulceration of the right foot. He states the area has callused over and is painful. He believes there may be a piece of glass in the foot from an injury that occurred one year ago. He also notes a corn to the medial side of the left great toe. He has not done anything to treat the symptoms. Applying pressure to the wound increases the pain. Patient is here for further evaluation and treatment.   Past Medical History:  Diagnosis Date  . Arthritis   . Cataracts, bilateral    immature  . CHF (congestive heart failure) (HCC)    not on any meds  . Chronic back pain    stenosis  . Chronic kidney disease, unspecified   . Coronary atherosclerosis of artery bypass graft   . Coronary atherosclerosis of native coronary artery 2005   s/p surgery  . Drug abuse ([place])    hx; tested for cocaine as recently as 2/08. says he is not using drugs now - avoided defib. for this reason   . GERD (gastroesophageal reflux disease)    takes OTC meds as needed  . Glaucoma    uses eye drops daily  . Gout, unspecified    takes Allopurinol daily as well as Colchicine-if needed  . Hepatitis 1967   Hep C  . History of colon polyps    benign  . HTN (hypertension)    takes Coreg,Imdur.and Apresoline daily  . Human immunodeficiency virus (HIV) disease ([place])    takes Genvoya daily  . Hyperlipidemia    takes Atorvastatin daily  . Ischemic cardiomyopathy   . Muscle spasm    takes Zanaflex as needed  . Nocturia   . Peripheral neuropathy    takes gabapentin daily  . Pneumonia    hx of  . Shortness of breath dyspnea    rarely but if notices it then with exertion  . Syphilis, unspecified   . Type II or unspecified type diabetes mellitus without mention of complication, not stated as uncontrolled 2004   Lantus daily.Average fasting blood sugar 125-199      Objective/Physical Exam General: The patient is alert and oriented x3 in  no acute distress.  Dermatology:  Wound #1 noted to the right foot measuring 1.5 x 2.0 x 1.0 cm (LxWxD).   To the noted ulceration(s), there is no eschar. There is a moderate amount of slough, fibrin, and necrotic tissue noted. Granulation tissue and wound base is red. There is a minimal amount of serosanguineous drainage noted. There is no exposed bone muscle-tendon ligament or joint. There is no malodor. Periwound integrity is intact.  Vascular: Palpable pedal pulses bilaterally. No edema or erythema noted. Capillary refill within normal limits.  Neurological: Epicritic and protective threshold diminished bilaterally.   Musculoskeletal Exam: Range of motion within normal limits to all pedal and ankle joints bilateral. Muscle strength 5/5 in all groups bilateral.   Radiographic Exam:  Normal osseous mineralization. Joint spaces preserved. No fracture/dislocation/boney destruction.     Assessment: #1 ulceration to the right foot secondary to diabetes mellitus #2 diabetes mellitus w/ peripheral neuropathy   Plan of Care:  #1 Patient was evaluated. X-Rays reviewed.  #2 Medically necessary excisional debridement including muscle and deep fascial tissue was performed using a tissue nipper and a chisel blade. Excisional debridement of all the necrotic nonviable tissue down to healthy bleeding viable tissue was performed with post-debridement measurements same as pre-. #3 The  wound was cleansed and dry sterile dressing applied. #4 Prescription for gentamicin cream provided to patient.  #5 Prescription for Bactrim DS provided to patient.  #6 Culture taken from wound.  #7 Post op shoe dispensed.  #8 Return to clinic in 2 weeks.   Goes by [employer].   Edrick Kins, DPM [facility]  Dr. Edrick Kins, [place]                                        [place], [place] [address]                Office [phone]  Fax [phone]

## 2018-02-27 ENCOUNTER — Ambulatory Visit (INDEPENDENT_AMBULATORY_CARE_PROVIDER_SITE_OTHER): Payer: Medicare Other | Admitting: Internal Medicine

## 2018-02-27 ENCOUNTER — Ambulatory Visit
Admission: RE | Admit: 2018-02-27 | Discharge: 2018-02-27 | Disposition: A | Discharge status: Home / Self Care | Payer: Medicare Other | Source: Ambulatory Visit | Attending: Internal Medicine | Admitting: Internal Medicine

## 2018-02-27 ENCOUNTER — Encounter: Payer: Self-pay | Admitting: Internal Medicine

## 2018-02-27 VITALS — BP 136/84 | HR 70 | Temp 98.2°F | Resp 10 | Ht 74.0 in | Wt 209.0 lb

## 2018-02-27 DIAGNOSIS — I10 Essential (primary) hypertension: Secondary | ICD-10-CM

## 2018-02-27 DIAGNOSIS — E1140 Type 2 diabetes mellitus with diabetic neuropathy, unspecified: Secondary | ICD-10-CM | POA: Diagnosis not present

## 2018-02-27 DIAGNOSIS — E1122 Type 2 diabetes mellitus with diabetic chronic kidney disease: Secondary | ICD-10-CM

## 2018-02-27 DIAGNOSIS — R0602 Shortness of breath: Secondary | ICD-10-CM

## 2018-02-27 DIAGNOSIS — N184 Chronic kidney disease, stage 4 (severe): Secondary | ICD-10-CM | POA: Diagnosis not present

## 2018-02-27 DIAGNOSIS — Z794 Long term (current) use of insulin: Secondary | ICD-10-CM

## 2018-02-27 DIAGNOSIS — J811 Chronic pulmonary edema: Secondary | ICD-10-CM | POA: Diagnosis not present

## 2018-02-27 LAB — CBC WITH DIFFERENTIAL/PLATELET
BASOS PCT: 1.2 %
Basophils Absolute: 68 cells/uL (ref 0–200)
Eosinophils Absolute: 291 cells/uL (ref 15–500)
Eosinophils Relative: 5.1 %
HEMATOCRIT: 36.8 % — AB (ref 38.5–50.0)
HEMOGLOBIN: 12.8 g/dL — AB (ref 13.2–17.1)
LYMPHS ABS: 2149 {cells}/uL (ref 850–3900)
MCH: 33.3 pg — ABNORMAL HIGH (ref 27.0–33.0)
MCHC: 34.8 g/dL (ref 32.0–36.0)
MCV: 95.8 fL (ref 80.0–100.0)
MPV: 12.5 fL (ref 7.5–12.5)
Monocytes Relative: 12.3 %
Neutro Abs: 2491 cells/uL (ref 1500–7800)
Neutrophils Relative %: 43.7 %
Platelets: 186 10*3/uL (ref 140–400)
RBC: 3.84 10*6/uL — ABNORMAL LOW (ref 4.20–5.80)
RDW: 14 % (ref 11.0–15.0)
Total Lymphocyte: 37.7 %
WBC mixed population: 701 cells/uL (ref 200–950)
WBC: 5.7 10*3/uL (ref 3.8–10.8)

## 2018-02-27 LAB — COMPLETE METABOLIC PANEL WITH GFR
AG Ratio: 1 (calc) (ref 1.0–2.5)
ALBUMIN MSPROF: 3.8 g/dL (ref 3.6–5.1)
ALKALINE PHOSPHATASE (APISO): 39 U/L — AB (ref 40–115)
ALT: 16 U/L (ref 9–46)
AST: 24 U/L (ref 10–35)
BUN/Creatinine Ratio: 13 (calc) (ref 6–22)
BUN: 50 mg/dL — AB (ref 7–25)
CALCIUM: 9 mg/dL (ref 8.6–10.3)
CO2: 18 mmol/L — AB (ref 20–32)
Chloride: 112 mmol/L — ABNORMAL HIGH (ref 98–110)
Creat: 3.99 mg/dL — ABNORMAL HIGH (ref 0.70–1.25)
GFR, Est African American: 17 mL/min/{1.73_m2} — ABNORMAL LOW (ref >=60)
GFR, Est Non African American: 14 mL/min/{1.73_m2} — ABNORMAL LOW (ref >=60)
GLUCOSE: 85 mg/dL (ref 65–139)
Globulin: 3.9 g/dL (calc) — ABNORMAL HIGH (ref 1.9–3.7)
Potassium: 6.7 mmol/L (ref 3.5–5.3)
Sodium: 135 mmol/L (ref 135–146)
Total Bilirubin: 0.3 mg/dL (ref 0.2–1.2)
Total Protein: 7.7 g/dL (ref 6.1–8.1)

## 2018-02-27 NOTE — Progress Notes (Signed)
[name], male   DOB: [birth year], 67 y.o.   MRN: [id number]   [facility] OFFICE  Provider: DR Arletha Grippe  Code Status: full code Goals of Care:  Advanced Directives 01/26/2018  Does Patient Have a Medical Advance Directive? No;Yes  Type of Advance Directive Living will  Does patient want to make changes to medical advance directive? -  Copy of [place] in Chart? -  Would patient like information on creating a medical advance directive? -     Chief Complaint  Patient presents with  . Acute Visit    Patient c/o fatigue, head discomfort, voice loss, and decreased mobility. Taking ClearLax for constipation, seems to help. Patient questions if this is safe to take.   [name] Health Maintenance    Discuss need for pneu as recommended in Health Maintenance     HPI: Patient is a 67 y.o. male seen today for an acute visit for SOB. He began to have pains across his chest this AM upon awakening. He was started on bactrim BID on 02/21/18 for an infected diabetic foot ulcer. Ulcer debrided on 02/21/18.  Wound cx neg. He was having sx's yesterday afternoon and was extremely SOB after bringing groceries into house. He decided to come to office today as he was feeling worse. He continues to smoke cigs. He is due to see nephrology later this month for CKD. He is on HAART tx for HIV and takes 3 drug tx for HTN. He took some miralax this AM due to c/a constipation.  HIV - stable on HAART tx (genvoya). HIV1 RNA quant <20; log <1.3. CD4 abs 860. Treponemal Abs reactive RPR titer 1:1. Followed by ID Dr [name] annually  HTN/CHF - takes hydralazine, coreg, imdur. Followed by Dr [name]   DM - controlled. He takes 1/2 dose lantus on each side of abdomen.  No low BS reactions. Numbness/tingling in feet occasionally. Takes Lantus  insulin 70 units qhs and humalog 12 units qAC. Followed by podiatry. No ulcerations but has calluses. A1c 6.4%. He is not on ARB due to AKI and hyperkalemia. He  has an eye specialist. He has diabetic preulcerative calluses and bunions and is followed by podiatry. Urine microalbumin/Cr ratio 1937 (prev 1142.6).  Hyperlipidemia - uncontrolled. Takes lipitor. LDL 130 (few mos ago); TG 436; HDL 31  CKD  - stage 4. Cr 3.65. Followed by nephrology Dr [name]  Past Medical History:  Diagnosis Date  . Arthritis   . Cataracts, bilateral    immature  . CHF (congestive heart failure) (HCC)    not on any meds  . Chronic back pain    stenosis  . Chronic kidney disease, unspecified   . Coronary atherosclerosis of artery bypass graft   . Coronary atherosclerosis of native coronary artery 2005   s/p surgery  . Drug abuse ([place])    hx; tested for cocaine as recently as 2/08. says he is not using drugs now - avoided defib. for this reason   . GERD (gastroesophageal reflux disease)    takes OTC meds as needed  . Glaucoma    uses eye drops daily  . Gout, unspecified    takes Allopurinol daily as well as Colchicine-if needed  . Hepatitis 1967   Hep C  . History of colon polyps    benign  . HTN (hypertension)    takes Coreg,Imdur.and Apresoline daily  . Human immunodeficiency virus (HIV) disease ([place])    takes Genvoya daily  .  Hyperlipidemia    takes Atorvastatin daily  . Ischemic cardiomyopathy   . Muscle spasm    takes Zanaflex as needed  . Nocturia   . Peripheral neuropathy    takes gabapentin daily  . Pneumonia    hx of  . Shortness of breath dyspnea    rarely but if notices it then with exertion  . Syphilis, unspecified   . Type II or unspecified type diabetes mellitus without mention of complication, not stated as uncontrolled 2004   Lantus daily.Average fasting blood sugar 125-199    Past Surgical History:  Procedure Laterality Date  . BACK SURGERY    . CARDIAC SURGERY    . CHOLECYSTECTOMY    . COLONOSCOPY  2013   Dr.John Henrene Pastor   . CORONARY ARTERY BYPASS GRAFT    . laproscopic cholecystectomy  8/07  . left intertrochanteric hip  fracture  2004   s/p intermedullary nail placement 2/08  . left transverse mandibular fracture  10/05  . LUMBAR LAMINECTOMY/DECOMPRESSION MICRODISCECTOMY N/A 02/29/2016   Procedure: Left L4-5 Lateral Recess Decompression, Removal Extradural Intraspinal Facet Cyst;  Surgeon: Marybelle Killings, MD;  Location: [place];  Service: Orthopedics;  Laterality: N/A;     reports that he has been smoking cigarettes.  He has been smoking about 0.00 packs per day for the past 41.00 years. He has never used smokeless tobacco. He reports that he drinks alcohol. He reports that he does not use drugs. Social History   Socioeconomic History  . Marital status: Single    Spouse name: Not on file  . Number of children: 1  . Years of education: Not on file  . Highest education level: Not on file  Occupational History  . Occupation: retired  . Occupation: part time- [employer]  . Financial resource strain: Not on file  . Food insecurity:    Worry: Not on file    Inability: Not on file  . Transportation needs:    Medical: Not on file    Non-medical: Not on file  Tobacco Use  . Smoking status: Current Every Day Smoker    Packs/day: 0.00    Years: 41.00    Pack years: 0.00    Types: Cigarettes  . Smokeless tobacco: Never Used  . Tobacco comment: States started smoking again today  Substance and Sexual Activity  . Alcohol use: Yes    Comment: occasionally  . Drug use: No    Types: Cocaine    Comment: hx of crack/cocaine 23yr ago  . Sexual activity: Not Currently    Comment: declined condoms  Lifestyle  . Physical activity:    Days per week: Not on file    Minutes per session: Not on file  . Stress: Not on file  Relationships  . Social connections:    Talks on phone: Not on file    Gets together: Not on file    Attends religious service: Not on file    Active member of club or organization: Not on file    Attends meetings of clubs or organizations: Not on file    Relationship status: Not  on file  . Intimate partner violence:    Fear of current or ex partner: Not on file    Emotionally abused: Not on file    Physically abused: Not on file    Forced sexual activity: Not on file  Other Topics Concern  . Not on file  Social History Narrative   Retired.  As of 05/2015:   Diet: No salt    Caffeine   Married: Single    House: Condo, 2 stories, 1 person (self)    Pets: No    Current/Past profession: N/A   Exercise: walks daily    Living Will: Yes    DNR   POA/HPOA: No        Family History  Problem Relation Age of Onset  . Heart failure Father   . Hypertension Father   . Diabetes Brother   . Heart attack Brother   . Alzheimer's disease Mother   . Stroke Sister   . Diabetes Sister   . Alzheimer's disease Sister   . Hypertension Brother   . Diabetes Brother   . Drug abuse Brother   . Colon cancer Neg Hx     Allergies  Allergen Reactions  . Amphetamines Other (See Comments)    Pt is unaware of this -     Outpatient Encounter Medications as of 02/27/2018  Medication Sig  . allopurinol (ZYLOPRIM) 100 MG tablet Take 1 tablet (100 mg total) by mouth daily.  Jearl Klinefelter ELLIPTA 62.5-25 MCG/INH AEPB INHALE 1 PUFF BY MOUTH ONCE DAILY  . aspirin EC 81 MG tablet Take 81 mg by mouth daily.  [name] atorvastatin (LIPITOR) 20 MG tablet TAKE 1 TABLET BY MOUTH EVERY DAY  . B-D UF III MINI PEN NEEDLES 31G X 5 MM MISC use four times a day  . Blood Glucose Monitoring Suppl (ONE TOUCH ULTRA MINI) w/Device KIT 1 each by Does not apply route 2 (two) times daily. Dx: E11.40  . carvedilol (COREG) 25 MG tablet TAKE 1 TABLET BY MOUTH TWICE A DAY WITH MEALS  . Choline Fenofibrate (FENOFIBRIC ACID) 135 MG CPDR take 1 capsule by mouth once daily  . colchicine 0.6 MG tablet Take one tablet by mouth as needed for gout  . furosemide (LASIX) 40 MG tablet Take 40 mg by mouth daily.  [name] gabapentin (NEURONTIN) 300 MG capsule TAKE 1 CAPSULE BY MOUTH THREE TIMES A DAY  . gentamicin cream  (GARAMYCIN) 0.1 % Apply 1 application topically 3 (three) times daily.  . GENVOYA 150-150-200-10 MG TABS tablet TAKE ONE TABLET BY MOUTH ONCE DAILY WITH BREAKFAST. STORE IN ORIGINALCONTAINER AT ROOM TEMPERATURE.  . Glucosamine-Chondroit-Vit C-Mn (GLUCOSAMINE CHONDR 1500 COMPLX PO) Take 2 tablets by mouth daily. Reported on 04/04/2016  . glucose blood test strip Use to test blood sugar twice daily. E11.40  . HUMALOG KWIKPEN 100 UNIT/ML KiwkPen INJECT 12 UNITS SUBCUTANEOUSLY THREE TIMES A DAY BEFORE MEALS  . hydrALAZINE (APRESOLINE) 25 MG tablet TAKE 1 TABLET BY MOUTH THREE TIMES DAILY  . isosorbide mononitrate (IMDUR) 30 MG 24 hr tablet take 1 tablet by mouth once daily  . LANTUS SOLOSTAR 100 UNIT/ML Solostar Pen INJECT 70 UNITS SUBCUTANEOUSLY EVERY MORNING (35 UNITS ON EACH SIDE)  . latanoprost (XALATAN) 0.005 % ophthalmic solution Place 1 drop into both eyes at bedtime. Reported on 04/04/2016  . Naftifine HCl (NAFTIN) 2 % CREA Apply to affected area twice daily.  . nitroGLYCERIN (NITROSTAT) 0.3 MG SL tablet Place 1 tablet (0.3 mg total) under the tongue as needed for chest pain (not to exceed 3 tablets in one day.).  [name] sulfamethoxazole-trimethoprim (BACTRIM DS,SEPTRA DS) 800-160 MG tablet Take 1 tablet by mouth 2 (two) times daily.  . [DISCONTINUED] calcium carbonate (OS-CAL) 600 MG TABS Take 600 mg by mouth 2 (two) times daily with a meal.     No facility-administered encounter medications on file  as of 02/27/2018.     Review of Systems:  Review of Systems  Constitutional: Positive for fatigue.  Respiratory: Positive for shortness of breath and wheezing.   Cardiovascular: Positive for chest pain and leg swelling.  Musculoskeletal: Positive for arthralgias.  All other systems reviewed and are negative.   Health Maintenance  Topic Date Due  . PNA vac Low Risk Adult (2 of 2 - PPSV23) 08/18/2017  . HEMOGLOBIN A1C  04/10/2018  . INFLUENZA VACCINE  05/24/2018  . OPHTHALMOLOGY EXAM  06/30/2018    . FOOT EXAM  10/11/2018  . LIPID PANEL  01/09/2019  . TETANUS/TDAP  07/27/2021  . COLONOSCOPY  10/01/2022  . Hepatitis C Screening  Completed    Physical Exam: Vitals:   02/27/18 1348  BP: 136/84  Pulse: 70  Resp: 10  Temp: 98.2 F (36.8 C)  TempSrc: Oral  SpO2: 97%  Weight: 209 lb (94.8 kg)  Height: _0  (1.88 m)   Body mass index is 26.83 kg/m. Physical Exam  Constitutional: He is oriented to person, place, and time. He appears well-developed and well-nourished. He appears distressed.  (+) conversational dyspnea, looks uncomfortable  HENT:  Mouth/Throat: Oropharynx is clear and moist.  MMM; no oral thrush  Eyes: Pupils are equal, round, and reactive to light. No scleral icterus.  Neck: Neck supple. Carotid bruit is not present. No thyromegaly present.  Cardiovascular: Normal rate, regular rhythm and intact distal pulses. Exam reveals no gallop and no friction rub.  Murmur (1/6 SEM) heard. no distal LE swelling. No calf TTP  Pulmonary/Chest: No stridor. He is in respiratory distress. He has wheezes (min end expiratory). He has rales (inspiratory and expiratory b/l). He exhibits no tenderness.  Abdominal: Soft. Bowel sounds are normal. He exhibits distension. He exhibits no abdominal bruit, no pulsatile midline mass and no mass. There is no hepatomegaly. There is no tenderness. There is no rebound and no guarding.  Musculoskeletal: He exhibits edema.  Lymphadenopathy:    He has no cervical adenopathy.  Neurological: He is alert and oriented to person, place, and time. He has normal reflexes.  Skin: Skin is warm and dry. No rash noted.  Right foot wound followed by podiatry  Psychiatric: He has a normal mood and affect. His behavior is normal. Judgment and thought content normal.    Labs reviewed: Basic Metabolic Panel: Recent Labs    07/18/17 0828 12/12/17 1006 01/08/18 0827 01/25/18 1126  NA 139 137 137 134  K 5.5* 5.5* 5.5* 5.6*  CL 111* 111* 110 105  CO2  24 16* 22 17*  GLUCOSE 73 96 66 205*  BUN 44* 51* 53* 72*  CREATININE 2.79* 3.20* 3.06* 3.65*  CALCIUM 9.5 9.3 9.2 8.8  TSH 0.81  --   --   --    Liver Function Tests: Recent Labs    03/15/17 0924 06/27/17 1028 07/18/17 0828 01/08/18 0827  AST _1 ALT _2 ALKPHOS 65 54  --   --   BILITOT 0.3 0.3 0.4 0.4  PROT 7.5 7.4 7.8 8.3*  ALBUMIN 3.6 3.6  --   --    No results for input(s): LIPASE, AMYLASE in the last 8760 hours. No results for input(s): AMMONIA in the last 8760 hours. CBC: Recent Labs    06/27/17 1028 12/12/17 1006  WBC 5.1 4.6  HGB 14.2 15.0  HCT 42.0 44.3  MCV 95.5 94.5  PLT 179 156   Lipid Panel: Recent Labs  03/15/17 0924 07/18/17 0828 10/09/17 0821 01/08/18 0827  CHOL 178 177 211* 170  HDL 30* 34* 39* 31*  LDLCALC 100* 104* 130*  --   TRIG 239* 295* 297* 436*  CHOLHDL 5.9* 5.2* 5.4* 5.5*   Lab Results  Component Value Date   HGBA1C 6.4 (H) 01/08/2018    Procedures since last visit: Dg Foot Complete Right  Result Date: 02/21/2018 Please see detailed radiograph report in office note.   Assessment/Plan   ICD-10-CM   1. SOB (shortness of breath) R06.02 DG Chest 2 View    CMP with eGFR([facility])    CBC with Differential/Platelets   r/o HF, PNA and pleural effusion  2. CKD stage 4 due to type 2 diabetes mellitus (HCC) E11.22    N18.4   3. Essential hypertension I10   4. Type 2 diabetes mellitus with diabetic neuropathy, with long-term current use of insulin ([place]) E11.40    Z79.4    GET CHEST XRAY NOW - will call with results  Will call with lab results - may need to be sent to the ED for further eval  NO MIRaLAX FOR NOW due to possible worsening kidney function  Reduce bactrim tabs (antibiotic) to 1 daily due to kidney diease  Continue other medications as ordered  Follow up in 1-2 weeks for SOB, CKD  Azura Tufaro S. Perlie Gold  [facility] [address] [place], [place] [address] [phone] Cell (Monday-Friday 8 AM - 5 PM) [phone] After 5 PM and follow prompts

## 2018-02-27 NOTE — Patient Instructions (Signed)
GET CHEST XRAY NOW - will call with results  Will call with lab results  NO [place] due to possible worsening kidney function  Reduce bactrim tabs (antibiotic) to 1 daily due to kidney diease  Continue other medications as ordered  Follow up in 1-2 weeks for SOB, CKD

## 2018-02-28 ENCOUNTER — Encounter (HOSPITAL_COMMUNITY): Payer: Self-pay | Admitting: Emergency Medicine

## 2018-02-28 ENCOUNTER — Emergency Department (HOSPITAL_COMMUNITY): Payer: Medicare Other

## 2018-02-28 ENCOUNTER — Inpatient Hospital Stay (HOSPITAL_COMMUNITY): Payer: Medicare Other

## 2018-02-28 ENCOUNTER — Other Ambulatory Visit: Payer: Self-pay

## 2018-02-28 ENCOUNTER — Inpatient Hospital Stay (HOSPITAL_COMMUNITY)
Admission: EM | Admit: 2018-02-28 | Discharge: 2018-03-01 | DRG: 291 | Disposition: A | Discharge status: Home / Self Care | Payer: Medicare Other | Attending: Internal Medicine | Admitting: Internal Medicine

## 2018-02-28 DIAGNOSIS — N179 Acute kidney failure, unspecified: Secondary | ICD-10-CM | POA: Diagnosis not present

## 2018-02-28 DIAGNOSIS — D638 Anemia in other chronic diseases classified elsewhere: Secondary | ICD-10-CM | POA: Diagnosis present

## 2018-02-28 DIAGNOSIS — R0603 Acute respiratory distress: Secondary | ICD-10-CM | POA: Diagnosis not present

## 2018-02-28 DIAGNOSIS — L97519 Non-pressure chronic ulcer of other part of right foot with unspecified severity: Secondary | ICD-10-CM | POA: Diagnosis present

## 2018-02-28 DIAGNOSIS — J449 Chronic obstructive pulmonary disease, unspecified: Secondary | ICD-10-CM | POA: Diagnosis not present

## 2018-02-28 DIAGNOSIS — H409 Unspecified glaucoma: Secondary | ICD-10-CM | POA: Diagnosis not present

## 2018-02-28 DIAGNOSIS — M109 Gout, unspecified: Secondary | ICD-10-CM | POA: Diagnosis present

## 2018-02-28 DIAGNOSIS — N185 Chronic kidney disease, stage 5: Secondary | ICD-10-CM | POA: Diagnosis not present

## 2018-02-28 DIAGNOSIS — E1142 Type 2 diabetes mellitus with diabetic polyneuropathy: Secondary | ICD-10-CM | POA: Diagnosis present

## 2018-02-28 DIAGNOSIS — I509 Heart failure, unspecified: Secondary | ICD-10-CM | POA: Diagnosis not present

## 2018-02-28 DIAGNOSIS — Z888 Allergy status to other drugs, medicaments and biological substances status: Secondary | ICD-10-CM

## 2018-02-28 DIAGNOSIS — Z951 Presence of aortocoronary bypass graft: Secondary | ICD-10-CM

## 2018-02-28 DIAGNOSIS — E785 Hyperlipidemia, unspecified: Secondary | ICD-10-CM | POA: Diagnosis present

## 2018-02-28 DIAGNOSIS — M549 Dorsalgia, unspecified: Secondary | ICD-10-CM | POA: Diagnosis present

## 2018-02-28 DIAGNOSIS — I10 Essential (primary) hypertension: Secondary | ICD-10-CM | POA: Diagnosis present

## 2018-02-28 DIAGNOSIS — G8929 Other chronic pain: Secondary | ICD-10-CM | POA: Diagnosis present

## 2018-02-28 DIAGNOSIS — I130 Hypertensive heart and chronic kidney disease with heart failure and stage 1 through stage 4 chronic kidney disease, or unspecified chronic kidney disease: Secondary | ICD-10-CM | POA: Diagnosis not present

## 2018-02-28 DIAGNOSIS — I5043 Acute on chronic combined systolic (congestive) and diastolic (congestive) heart failure: Secondary | ICD-10-CM | POA: Diagnosis present

## 2018-02-28 DIAGNOSIS — I255 Ischemic cardiomyopathy: Secondary | ICD-10-CM | POA: Diagnosis present

## 2018-02-28 DIAGNOSIS — J9601 Acute respiratory failure with hypoxia: Secondary | ICD-10-CM | POA: Diagnosis present

## 2018-02-28 DIAGNOSIS — G629 Polyneuropathy, unspecified: Secondary | ICD-10-CM | POA: Diagnosis present

## 2018-02-28 DIAGNOSIS — I5042 Chronic combined systolic (congestive) and diastolic (congestive) heart failure: Secondary | ICD-10-CM | POA: Diagnosis present

## 2018-02-28 DIAGNOSIS — Z21 Asymptomatic human immunodeficiency virus [HIV] infection status: Secondary | ICD-10-CM | POA: Diagnosis present

## 2018-02-28 DIAGNOSIS — D631 Anemia in chronic kidney disease: Secondary | ICD-10-CM | POA: Diagnosis not present

## 2018-02-28 DIAGNOSIS — R0600 Dyspnea, unspecified: Secondary | ICD-10-CM

## 2018-02-28 DIAGNOSIS — Z86010 Personal history of colonic polyps: Secondary | ICD-10-CM

## 2018-02-28 DIAGNOSIS — J9600 Acute respiratory failure, unspecified whether with hypoxia or hypercapnia: Secondary | ICD-10-CM | POA: Diagnosis present

## 2018-02-28 DIAGNOSIS — E1169 Type 2 diabetes mellitus with other specified complication: Secondary | ICD-10-CM | POA: Diagnosis present

## 2018-02-28 DIAGNOSIS — R0602 Shortness of breath: Secondary | ICD-10-CM | POA: Diagnosis not present

## 2018-02-28 DIAGNOSIS — R069 Unspecified abnormalities of breathing: Secondary | ICD-10-CM | POA: Diagnosis not present

## 2018-02-28 DIAGNOSIS — F17210 Nicotine dependence, cigarettes, uncomplicated: Secondary | ICD-10-CM | POA: Diagnosis present

## 2018-02-28 DIAGNOSIS — M62838 Other muscle spasm: Secondary | ICD-10-CM | POA: Diagnosis present

## 2018-02-28 DIAGNOSIS — E1122 Type 2 diabetes mellitus with diabetic chronic kidney disease: Secondary | ICD-10-CM | POA: Diagnosis present

## 2018-02-28 DIAGNOSIS — N184 Chronic kidney disease, stage 4 (severe): Secondary | ICD-10-CM | POA: Diagnosis present

## 2018-02-28 DIAGNOSIS — I272 Pulmonary hypertension, unspecified: Secondary | ICD-10-CM | POA: Diagnosis present

## 2018-02-28 DIAGNOSIS — Z7982 Long term (current) use of aspirin: Secondary | ICD-10-CM

## 2018-02-28 DIAGNOSIS — B20 Human immunodeficiency virus [HIV] disease: Secondary | ICD-10-CM | POA: Diagnosis present

## 2018-02-28 DIAGNOSIS — Z79899 Other long term (current) drug therapy: Secondary | ICD-10-CM

## 2018-02-28 DIAGNOSIS — Z8249 Family history of ischemic heart disease and other diseases of the circulatory system: Secondary | ICD-10-CM

## 2018-02-28 DIAGNOSIS — I2510 Atherosclerotic heart disease of native coronary artery without angina pectoris: Secondary | ICD-10-CM | POA: Diagnosis not present

## 2018-02-28 DIAGNOSIS — Z833 Family history of diabetes mellitus: Secondary | ICD-10-CM

## 2018-02-28 DIAGNOSIS — J441 Chronic obstructive pulmonary disease with (acute) exacerbation: Secondary | ICD-10-CM | POA: Diagnosis present

## 2018-02-28 DIAGNOSIS — R079 Chest pain, unspecified: Secondary | ICD-10-CM | POA: Diagnosis not present

## 2018-02-28 DIAGNOSIS — I5022 Chronic systolic (congestive) heart failure: Secondary | ICD-10-CM

## 2018-02-28 DIAGNOSIS — I110 Hypertensive heart disease with heart failure: Secondary | ICD-10-CM | POA: Diagnosis not present

## 2018-02-28 DIAGNOSIS — F17200 Nicotine dependence, unspecified, uncomplicated: Secondary | ICD-10-CM | POA: Diagnosis present

## 2018-02-28 DIAGNOSIS — N183 Chronic kidney disease, stage 3 unspecified: Secondary | ICD-10-CM | POA: Diagnosis present

## 2018-02-28 DIAGNOSIS — K219 Gastro-esophageal reflux disease without esophagitis: Secondary | ICD-10-CM | POA: Diagnosis present

## 2018-02-28 DIAGNOSIS — E875 Hyperkalemia: Secondary | ICD-10-CM | POA: Diagnosis not present

## 2018-02-28 DIAGNOSIS — Z794 Long term (current) use of insulin: Secondary | ICD-10-CM

## 2018-02-28 DIAGNOSIS — E119 Type 2 diabetes mellitus without complications: Secondary | ICD-10-CM

## 2018-02-28 HISTORY — DX: Chronic kidney disease, stage 5: N18.5

## 2018-02-28 HISTORY — DX: Type 2 diabetes mellitus without complications: E11.9

## 2018-02-28 HISTORY — DX: Personal history of urinary calculi: Z87.442

## 2018-02-28 HISTORY — DX: Low back pain, unspecified: M54.50

## 2018-02-28 HISTORY — DX: Acute myocardial infarction, unspecified: I21.9

## 2018-02-28 HISTORY — DX: Low back pain: M54.5

## 2018-02-28 HISTORY — DX: Unspecified viral hepatitis B without hepatic coma: B19.10

## 2018-02-28 HISTORY — DX: Personal history of other diseases of the musculoskeletal system and connective tissue: Z87.39

## 2018-02-28 HISTORY — DX: Other chronic pain: G89.29

## 2018-02-28 LAB — RENAL FUNCTION PANEL
Albumin: 3.5 g/dL (ref 3.5–5.0)
Anion gap: 9 (ref 5–15)
BUN: 60 mg/dL — ABNORMAL HIGH (ref 6–20)
CO2: 22 mmol/L (ref 22–32)
Calcium: 9.4 mg/dL (ref 8.9–10.3)
Chloride: 106 mmol/L (ref 101–111)
Creatinine, Ser: 4.7 mg/dL — ABNORMAL HIGH (ref 0.61–1.24)
GFR calc Af Amer: 13 mL/min — ABNORMAL LOW (ref >60)
GFR calc non Af Amer: 12 mL/min — ABNORMAL LOW (ref >60)
Glucose, Bld: 292 mg/dL — ABNORMAL HIGH (ref 65–99)
Phosphorus: 3.5 mg/dL (ref 2.5–4.6)
Potassium: 6.8 mmol/L (ref 3.5–5.1)
Sodium: 137 mmol/L (ref 135–145)

## 2018-02-28 LAB — COMPREHENSIVE METABOLIC PANEL
ALT: 25 U/L (ref 17–63)
AST: 37 U/L (ref 15–41)
Albumin: 3.4 g/dL — ABNORMAL LOW (ref 3.5–5.0)
Alkaline Phosphatase: 36 U/L — ABNORMAL LOW (ref 38–126)
Anion gap: 11 (ref 5–15)
BUN: 55 mg/dL — ABNORMAL HIGH (ref 6–20)
CO2: 13 mmol/L — AB (ref 22–32)
Calcium: 8.8 mg/dL — ABNORMAL LOW (ref 8.9–10.3)
Chloride: 112 mmol/L — ABNORMAL HIGH (ref 101–111)
Creatinine, Ser: 4.68 mg/dL — ABNORMAL HIGH (ref 0.61–1.24)
GFR, EST AFRICAN AMERICAN: 13 mL/min — AB (ref >60)
GFR, EST NON AFRICAN AMERICAN: 12 mL/min — AB (ref >60)
Glucose, Bld: 202 mg/dL — ABNORMAL HIGH (ref 65–99)
POTASSIUM: 7 mmol/L — AB (ref 3.5–5.1)
Sodium: 136 mmol/L (ref 135–145)
Total Bilirubin: 1 mg/dL (ref 0.3–1.2)
Total Protein: 7.4 g/dL (ref 6.5–8.1)

## 2018-02-28 LAB — CBC
HCT: 38.4 % — ABNORMAL LOW (ref 39.0–52.0)
Hemoglobin: 12.6 g/dL — ABNORMAL LOW (ref 13.0–17.0)
MCH: 32.7 pg (ref 26.0–34.0)
MCHC: 32.8 g/dL (ref 30.0–36.0)
MCV: 99.7 fL (ref 78.0–100.0)
PLATELETS: 167 10*3/uL (ref 150–400)
RBC: 3.85 MIL/uL — ABNORMAL LOW (ref 4.22–5.81)
RDW: 14.8 % (ref 11.5–15.5)
WBC: 10.5 10*3/uL (ref 4.0–10.5)

## 2018-02-28 LAB — TROPONIN I: Troponin I: <0.03 ng/mL (ref <0.03)

## 2018-02-28 LAB — CBG MONITORING, ED
Glucose-Capillary: 180 mg/dL — ABNORMAL HIGH (ref 65–99)
Glucose-Capillary: 299 mg/dL — ABNORMAL HIGH (ref 65–99)

## 2018-02-28 LAB — GLUCOSE, CAPILLARY: GLUCOSE-CAPILLARY: 256 mg/dL — AB (ref 65–99)

## 2018-02-28 LAB — BRAIN NATRIURETIC PEPTIDE: B Natriuretic Peptide: 691.4 pg/mL — ABNORMAL HIGH (ref 0.0–100.0)

## 2018-02-28 LAB — ECHOCARDIOGRAM COMPLETE

## 2018-02-28 MED ORDER — LATANOPROST 0.005 % OP SOLN
1.0000 [drp] | Freq: Every day | OPHTHALMIC | Status: DC
  Administered 2018-02-28: 1 [drp] via OPHTHALMIC
  Filled 2018-02-28: qty 2.5

## 2018-02-28 MED ORDER — ELVITEG-COBIC-EMTRICIT-TENOFAF 150-150-200-10 MG PO TABS
1.0000 | ORAL_TABLET | Freq: Every day | ORAL | Status: DC
  Administered 2018-02-28 – 2018-03-01 (×2): 1 via ORAL
  Filled 2018-02-28 (×2): qty 1

## 2018-02-28 MED ORDER — ASPIRIN EC 81 MG PO TBEC
81.0000 mg | DELAYED_RELEASE_TABLET | Freq: Every day | ORAL | Status: DC
  Administered 2018-02-28 – 2018-03-01 (×2): 81 mg via ORAL
  Filled 2018-02-28 (×2): qty 1

## 2018-02-28 MED ORDER — GABAPENTIN 300 MG PO CAPS
300.0000 mg | ORAL_CAPSULE | Freq: Three times a day (TID) | ORAL | Status: DC
  Administered 2018-02-28 – 2018-03-01 (×4): 300 mg via ORAL
  Filled 2018-02-28 (×4): qty 1

## 2018-02-28 MED ORDER — ATORVASTATIN CALCIUM 20 MG PO TABS
20.0000 mg | ORAL_TABLET | Freq: Every day | ORAL | Status: DC
  Administered 2018-02-28 – 2018-03-01 (×2): 20 mg via ORAL
  Filled 2018-02-28 (×3): qty 1

## 2018-02-28 MED ORDER — SODIUM POLYSTYRENE SULFONATE 15 GM/60ML PO SUSP
30.0000 g | Freq: Once | ORAL | Status: AC
  Administered 2018-02-28: 30 g via ORAL
  Filled 2018-02-28: qty 120

## 2018-02-28 MED ORDER — FUROSEMIDE 10 MG/ML IJ SOLN
40.0000 mg | Freq: Two times a day (BID) | INTRAMUSCULAR | Status: DC
  Administered 2018-02-28 – 2018-03-01 (×2): 40 mg via INTRAVENOUS
  Filled 2018-02-28 (×2): qty 4

## 2018-02-28 MED ORDER — ACETAMINOPHEN 325 MG PO TABS: 650.0000 mg | ORAL_TABLET | ORAL | Status: DC | PRN

## 2018-02-28 MED ORDER — SULFAMETHOXAZOLE-TRIMETHOPRIM 800-160 MG PO TABS
1.0000 | ORAL_TABLET | Freq: Every day | ORAL | Status: DC
  Administered 2018-02-28: 1 via ORAL
  Filled 2018-02-28 (×2): qty 1

## 2018-02-28 MED ORDER — SODIUM CHLORIDE 0.9% FLUSH
3.0000 mL | Freq: Two times a day (BID) | INTRAVENOUS | Status: DC
  Administered 2018-03-01: 3 mL via INTRAVENOUS

## 2018-02-28 MED ORDER — HEPARIN SODIUM (PORCINE) 5000 UNIT/ML IJ SOLN
5000.0000 [IU] | Freq: Three times a day (TID) | INTRAMUSCULAR | Status: DC
  Administered 2018-02-28 – 2018-03-01 (×3): 5000 [IU] via SUBCUTANEOUS
  Filled 2018-02-28 (×3): qty 1

## 2018-02-28 MED ORDER — FUROSEMIDE 10 MG/ML IJ SOLN
60.0000 mg | Freq: Once | INTRAMUSCULAR | Status: AC
  Administered 2018-02-28: 60 mg via INTRAVENOUS
  Filled 2018-02-28: qty 6

## 2018-02-28 MED ORDER — INSULIN ASPART 100 UNIT/ML ~~LOC~~ SOLN
0.0000 [IU] | Freq: Three times a day (TID) | SUBCUTANEOUS | Status: DC
  Administered 2018-02-28: 2 [IU] via SUBCUTANEOUS
  Administered 2018-02-28: 3 [IU] via SUBCUTANEOUS
  Administered 2018-03-01: 2 [IU] via SUBCUTANEOUS
  Filled 2018-02-28 (×2): qty 1

## 2018-02-28 MED ORDER — SODIUM BICARBONATE 8.4 % IV SOLN
INTRAVENOUS | Status: DC
  Administered 2018-02-28 (×2): via INTRAVENOUS
  Filled 2018-02-28 (×3): qty 850

## 2018-02-28 MED ORDER — SODIUM POLYSTYRENE SULFONATE 15 GM/60ML PO SUSP
45.0000 g | Freq: Once | ORAL | Status: AC
  Filled 2018-02-28: qty 180

## 2018-02-28 MED ORDER — ONDANSETRON HCL 4 MG/2ML IJ SOLN: 4.0000 mg | Freq: Four times a day (QID) | INTRAMUSCULAR | Status: DC | PRN

## 2018-02-28 MED ORDER — FENOFIBRATE 54 MG PO TABS
54.0000 mg | ORAL_TABLET | Freq: Every day | ORAL | Status: DC
Start: 2018-02-28 — End: 2018-03-01
  Administered 2018-02-28 – 2018-03-01 (×2): 54 mg via ORAL
  Filled 2018-02-28 (×3): qty 1

## 2018-02-28 MED ORDER — INSULIN ASPART 100 UNIT/ML ~~LOC~~ SOLN
0.0000 [IU] | Freq: Every day | SUBCUTANEOUS | Status: DC
  Administered 2018-02-28: 3 [IU] via SUBCUTANEOUS

## 2018-02-28 MED ORDER — SODIUM CHLORIDE 0.9% FLUSH: 3.0000 mL | INTRAVENOUS | Status: DC | PRN

## 2018-02-28 MED ORDER — GENTAMICIN SULFATE 0.1 % EX CREA
1.0000 "application " | TOPICAL_CREAM | Freq: Three times a day (TID) | CUTANEOUS | Status: DC
  Administered 2018-02-28 – 2018-03-01 (×2): 1 via TOPICAL
  Filled 2018-02-28: qty 15

## 2018-02-28 MED ORDER — COLCHICINE 0.6 MG PO TABS
0.6000 mg | ORAL_TABLET | Freq: Every day | ORAL | Status: DC
  Administered 2018-03-01: 0.6 mg via ORAL
  Filled 2018-02-28 (×2): qty 1

## 2018-02-28 MED ORDER — ISOSORBIDE MONONITRATE ER 30 MG PO TB24
30.0000 mg | ORAL_TABLET | Freq: Every day | ORAL | Status: DC
  Administered 2018-02-28 – 2018-03-01 (×2): 30 mg via ORAL
  Filled 2018-02-28 (×2): qty 1

## 2018-02-28 MED ORDER — CARVEDILOL 25 MG PO TABS
25.0000 mg | ORAL_TABLET | Freq: Two times a day (BID) | ORAL | Status: DC
  Administered 2018-02-28 – 2018-03-01 (×2): 25 mg via ORAL
  Filled 2018-02-28: qty 2
  Filled 2018-02-28: qty 1

## 2018-02-28 MED ORDER — ALLOPURINOL 100 MG PO TABS
100.0000 mg | ORAL_TABLET | Freq: Every day | ORAL | Status: DC
  Administered 2018-02-28 – 2018-03-01 (×2): 100 mg via ORAL
  Filled 2018-02-28 (×3): qty 1

## 2018-02-28 MED ORDER — ALBUTEROL SULFATE (2.5 MG/3ML) 0.083% IN NEBU
2.5000 mg | INHALATION_SOLUTION | Freq: Three times a day (TID) | RESPIRATORY_TRACT | Status: DC
  Administered 2018-02-28 – 2018-03-01 (×2): 2.5 mg via RESPIRATORY_TRACT
  Filled 2018-02-28 (×2): qty 3

## 2018-02-28 MED ORDER — INSULIN GLARGINE 100 UNIT/ML ~~LOC~~ SOLN
50.0000 [IU] | Freq: Every day | SUBCUTANEOUS | Status: DC
  Administered 2018-02-28: 50 [IU] via SUBCUTANEOUS
  Filled 2018-02-28: qty 0.5

## 2018-02-28 MED ORDER — ALBUTEROL SULFATE (2.5 MG/3ML) 0.083% IN NEBU
2.5000 mg | INHALATION_SOLUTION | RESPIRATORY_TRACT | Status: DC
  Administered 2018-02-28: 2.5 mg via RESPIRATORY_TRACT
  Filled 2018-02-28: qty 3

## 2018-02-28 MED ORDER — INSULIN ASPART 100 UNIT/ML ~~LOC~~ SOLN
5.0000 [IU] | Freq: Once | SUBCUTANEOUS | Status: AC
  Administered 2018-02-28: 5 [IU] via INTRAVENOUS
  Filled 2018-02-28: qty 1

## 2018-02-28 MED ORDER — SODIUM CHLORIDE 0.9 % IV SOLN: 250.0000 mL | INTRAVENOUS | Status: DC | PRN

## 2018-02-28 MED ORDER — SODIUM CHLORIDE 0.9 % IV SOLN
1.0000 g | Freq: Once | INTRAVENOUS | Status: AC
  Administered 2018-02-28: 1 g via INTRAVENOUS
  Filled 2018-02-28: qty 10

## 2018-02-28 MED ORDER — FENOFIBRIC ACID 135 MG PO CPDR: 1.0000 | DELAYED_RELEASE_CAPSULE | Freq: Every day | ORAL | Status: DC

## 2018-02-28 MED ORDER — HYDRALAZINE HCL 25 MG PO TABS
25.0000 mg | ORAL_TABLET | Freq: Three times a day (TID) | ORAL | Status: DC
  Administered 2018-02-28 – 2018-03-01 (×4): 25 mg via ORAL
  Filled 2018-02-28 (×4): qty 1

## 2018-02-28 NOTE — ED Notes (Signed)
Unsuccessful attempt to call report  rn to call me back  Charge  Just assigned the nurse

## 2018-02-28 NOTE — ED Triage Notes (Addendum)
Patient arrived with EMS from home reports worsening SOB with wheezing/chest congestion this morning , he received Solumedrol 125 mg IV  , Magnesium 2 grams IV and 2 doses of Duoneb treatment with CPAP.

## 2018-02-28 NOTE — ED Provider Notes (Addendum)
Patient turned over to me awaiting admission for exacerbation of CHF.  Patient with acute respiratory failure went on BiPAP.  Patient also does have a history of COPD.  Patient now off BiPAP EMS and given duo nebs x2 magnesium as well.  Potassium elevated yesterday and still elevated today may be with some slight hemolysis.  Blood sugar up a little bit will address the elevated potassium with calcium gluconate and some IV regular insulin.  Patient now off BiPAP working a little hard to breathe may have to go back on.  But patient tolerating it pretty well.  Lungs are clear do not hear any wheezing no leg swelling but abdomen does seem to be distended patient thinks her some fluid on the abdomen.  Patient is followed by [facility] called the hospitalist service for admission.   Fredia Sorrow, MD 02/28/18 [phone]   EKG without hyperkalemia changes.    Fredia Sorrow, MD 02/28/18 [phone]

## 2018-02-28 NOTE — ED Notes (Signed)
The pt is still grumbling about being here in the ed for so many hours

## 2018-02-28 NOTE — Progress Notes (Signed)
  Echocardiogram 2D Echocardiogram has been performed.  Darlina Sicilian M 02/28/2018, 12:51 PM

## 2018-02-28 NOTE — ED Provider Notes (Signed)
[facility] EMERGENCY DEPARTMENT Provider Note   CSN: [id number] Arrival date & time: 02/28/18  0541     History   Chief Complaint Chief Complaint  Patient presents with  . Shortness of Breath   Level 5 caveat: Respiratory distress  HPI Jacob [name] is a 67 y.o. male.  HPI Patient is a 67 year old male with a history of congestive heart failure presents the emergency department with 5 days of worsening shortness of breath.  He has a history of COPD as well.  He was seen by his doctor yesterday and had an outpatient x-ray that demonstrated pulmonary edema.  He states his breathing got worse in the middle the night and EMS was contacted.  He presents the emergency department after receiving CPAP by EMS as well as DuoNeb x2, mag x2, 125 mg of Solu-Medrol.  He states his breathing is somewhat improved.  He has rales on examination at this time.  His blood pressure is 189/85.  Denies productive cough.   Past Medical History:  Diagnosis Date  . Arthritis   . Cataracts, bilateral    immature  . CHF (congestive heart failure) (HCC)    not on any meds  . Chronic back pain    stenosis  . Chronic kidney disease, unspecified   . Coronary atherosclerosis of artery bypass graft   . Coronary atherosclerosis of native coronary artery 2005   s/p surgery  . Drug abuse ([place])    hx; tested for cocaine as recently as 2/08. says he is not using drugs now - avoided defib. for this reason   . GERD (gastroesophageal reflux disease)    takes OTC meds as needed  . Glaucoma    uses eye drops daily  . Gout, unspecified    takes Allopurinol daily as well as Colchicine-if needed  . Hepatitis 1967   Hep C  . History of colon polyps    benign  . HTN (hypertension)    takes Coreg,Imdur.and Apresoline daily  . Human immunodeficiency virus (HIV) disease ([place])    takes Genvoya daily  . Hyperlipidemia    takes Atorvastatin daily  . Ischemic cardiomyopathy   . Muscle spasm    takes Zanaflex as needed  . Nocturia   . Peripheral neuropathy    takes gabapentin daily  . Pneumonia    hx of  . Shortness of breath dyspnea    rarely but if notices it then with exertion  . Syphilis, unspecified   . Type II or unspecified type diabetes mellitus without mention of complication, not stated as uncontrolled 2004   Lantus daily.Average fasting blood sugar 125-199    Patient Active Problem List   Diagnosis Date Noted  . Anemia of chronic disease 11/20/2016  . CKD (chronic kidney disease) stage 3, GFR 30-59 ml/min (HCC) 11/20/2016  . CHF (congestive heart failure) ([place]) 11/10/2016  . Protein-calorie malnutrition, severe 08/03/2016  . Severe sepsis ([place]) 08/02/2016  . Pneumonia 08/02/2016  . History of lumbar laminectomy for spinal cord decompression 02/29/2016  . Renal failure (ARF), acute on chronic (HCC) 11/03/2015  . Dehydration 11/03/2015  . Alcohol dependence ([place]) 11/03/2015  . Type 2 diabetes mellitus with vascular disease ([place]) 06/17/2015  . HTN (hypertension) 04/26/2015  . Syncope 04/26/2015  . DM (diabetes mellitus) ([place]) 04/26/2015  . Ischemic cardiomyopathy 05/12/2014  . Diarrhea 09/08/2013  . Shock ([place]) 09/06/2013  . Ulcer of lower extremity ([place]) 09/06/2013  . Community acquired pneumonia 09/06/2013  . Hyponatremia 09/06/2013  .  Hyperkalemia 09/06/2013  . Hyperglycemia 09/06/2013  . Elevated transaminase level 09/06/2013  . Chest pain 09/06/2013  . Type II or unspecified type diabetes mellitus with unspecified complication, not stated as uncontrolled 10/22/2012  . Black stool 04/17/2012  . Bunion of left foot 11/25/2011  . Bunion, right foot 11/25/2011  . Polysubstance abuse ([place]) 07/28/2011  . Hip fracture, left ([place]) 04/04/2011  . Closed fracture of neck of femur ([place]) 04/04/2011  . Insomnia 02/18/2011  . CAD (coronary artery disease) 04/20/2009  . Chronic combined systolic and diastolic congestive heart failure ([place]) 04/20/2009  . HLD  (hyperlipidemia) 11/20/2006  . Gout 11/20/2006  . TOBACCO ABUSE 11/20/2006  . Essential hypertension 11/20/2006  . Human immunodeficiency virus (HIV) disease ([place]) 09/02/2006  . SYPHILIS 09/02/2006    Past Surgical History:  Procedure Laterality Date  . BACK SURGERY    . CARDIAC SURGERY    . CHOLECYSTECTOMY    . COLONOSCOPY  2013   Dr.John Henrene Pastor   . CORONARY ARTERY BYPASS GRAFT    . laproscopic cholecystectomy  8/07  . left intertrochanteric hip fracture  2004   s/p intermedullary nail placement 2/08  . left transverse mandibular fracture  10/05  . LUMBAR LAMINECTOMY/DECOMPRESSION MICRODISCECTOMY N/A 02/29/2016   Procedure: Left L4-5 Lateral Recess Decompression, Removal Extradural Intraspinal Facet Cyst;  Surgeon: Marybelle Killings, MD;  Location: [facility];  Service: Orthopedics;  Laterality: N/A;        Home Medications    Prior to Admission medications   Medication Sig Start Date End Date Taking? Authorizing Provider  allopurinol (ZYLOPRIM) 100 MG tablet Take 1 tablet (100 mg total) by mouth daily. 10/11/17   Gildardo Cranker, DO  ANORO ELLIPTA 62.5-25 MCG/INH AEPB INHALE 1 PUFF BY MOUTH ONCE DAILY 01/22/18   Gildardo Cranker, DO  aspirin EC 81 MG tablet Take 81 mg by mouth daily.    [provider]  atorvastatin (LIPITOR) 20 MG tablet TAKE 1 TABLET BY MOUTH EVERY DAY 02/20/18   Gildardo Cranker, DO  B-D UF III MINI PEN NEEDLES 31G X 5 MM MISC use four times a day 05/08/17   Gildardo Cranker, DO  Blood Glucose Monitoring Suppl (ONE TOUCH ULTRA MINI) w/Device KIT 1 each by Does not apply route 2 (two) times daily. Dx: E11.40 08/15/17   Gildardo Cranker, DO  carvedilol (COREG) 25 MG tablet TAKE 1 TABLET BY MOUTH TWICE A DAY WITH MEALS 12/20/17   [name], MD  Choline Fenofibrate (FENOFIBRIC ACID) 135 MG CPDR take 1 capsule by mouth once daily 09/20/17   Mariea Clonts, Tiffany L, DO  colchicine 0.6 MG tablet Take one tablet by mouth as needed for gout 02/23/18   Gildardo Cranker, DO  furosemide  (LASIX) 40 MG tablet Take 40 mg by mouth daily.    [provider]  gabapentin (NEURONTIN) 300 MG capsule TAKE 1 CAPSULE BY MOUTH THREE TIMES A DAY 12/20/17   Gildardo Cranker, DO  gentamicin cream (GARAMYCIN) 0.1 % Apply 1 application topically 3 (three) times daily. 02/21/18   Amalia Hailey, Brent M, DPM  GENVOYA 150-150-200-10 MG TABS tablet TAKE ONE TABLET BY MOUTH ONCE DAILY WITH BREAKFAST. STORE IN ORIGINALCONTAINER AT ROOM TEMPERATURE. 10/04/17   [name], MD  Glucosamine-Chondroit-Vit C-Mn (GLUCOSAMINE CHONDR 1500 COMPLX PO) Take 2 tablets by mouth daily. Reported on 04/04/2016    [provider]  glucose blood test strip Use to test blood sugar twice daily. E11.40 02/13/18   Gildardo Cranker, DO  HUMALOG KWIKPEN 100 UNIT/ML [employer]  12 UNITS SUBCUTANEOUSLY THREE TIMES A DAY BEFORE MEALS 01/09/18   Gildardo Cranker, DO  hydrALAZINE (APRESOLINE) 25 MG tablet TAKE 1 TABLET BY MOUTH THREE TIMES DAILY 01/25/18   Gildardo Cranker, DO  isosorbide mononitrate (IMDUR) 30 MG 24 hr tablet take 1 tablet by mouth once daily 06/22/17   [name], MD  LANTUS SOLOSTAR 100 UNIT/ML Solostar Pen INJECT 70 UNITS SUBCUTANEOUSLY EVERY MORNING (35 UNITS ON EACH SIDE) 09/20/17   Reed, Tiffany L, DO  latanoprost (XALATAN) 0.005 % ophthalmic solution Place 1 drop into both eyes at bedtime. Reported on 04/04/2016 04/23/12   [provider]  Naftifine HCl (NAFTIN) 2 % CREA Apply to affected area twice daily. 10/25/17   Hyatt, Max T, DPM  nitroGLYCERIN (NITROSTAT) 0.3 MG SL tablet Place 1 tablet (0.3 mg total) under the tongue as needed for chest pain (not to exceed 3 tablets in one day.). 01/08/18   [name], MD  sulfamethoxazole-trimethoprim (BACTRIM DS,SEPTRA DS) 800-160 MG tablet Take 1 tablet by mouth 2 (two) times daily. 02/21/18   Edrick Kins, DPM    Family History Family History  Problem Relation Age of Onset  . Heart failure Father   . Hypertension Father   . Diabetes Brother   .  Heart attack Brother   . Alzheimer's disease Mother   . Stroke Sister   . Diabetes Sister   . Alzheimer's disease Sister   . Hypertension Brother   . Diabetes Brother   . Drug abuse Brother   . Colon cancer Neg Hx     Social History Social History   Tobacco Use  . Smoking status: Current Every Day Smoker    Packs/day: 0.00    Years: 41.00    Pack years: 0.00    Types: Cigarettes  . Smokeless tobacco: Never Used  . Tobacco comment: States started smoking again today  Substance Use Topics  . Alcohol use: Yes    Comment: occasionally  . Drug use: No    Types: Cocaine    Comment: hx of crack/cocaine 54yr ago     Allergies   Amphetamines   Review of Systems Review of Systems  Unable to perform ROS: Severe respiratory distress     Physical Exam Updated Vital Signs BP (!) 189/85 (BP Location: Right Arm)   Pulse 77   Temp 97.6 F (36.4 C) (Temporal)   Resp (!) 22   SpO2 100%   Physical Exam  Constitutional: He is oriented to person, place, and time. He appears well-developed and well-nourished.  HENT:  Head: Normocephalic and atraumatic.  Eyes: EOM are normal.  Neck: Normal range of motion.  Cardiovascular: Normal rate, regular rhythm and normal heart sounds.  Pulmonary/Chest:  Rales bilaterally.  Wheeze right upper lung field.  Abdominal: Soft. He exhibits no distension. There is no tenderness.  Musculoskeletal: Normal range of motion.  Neurological: He is alert and oriented to person, place, and time.  Skin: Skin is warm and dry.  Psychiatric: He has a normal mood and affect. Judgment normal.  Nursing note and vitals reviewed.    ED Treatments / Results  Labs (all labs ordered are listed, but only abnormal results are displayed) Labs Reviewed  CBC - Abnormal; Notable for the following components:      Result Value   RBC 3.85 (*)    Hemoglobin 12.6 (*)    HCT 38.4 (*)    All other components within normal limits  COMPREHENSIVE METABOLIC PANEL    TROPONIN  I  BRAIN NATRIURETIC PEPTIDE    EKG EKG Interpretation  Date/Time:  Wednesday Feb 28 2018 05:45:50 EDT Ventricular Rate:  95 PR Interval:    QRS Duration: 145 QT Interval:  412 QTC Calculation: 518 R Axis:   -15 Text Interpretation:  Sinus rhythm Probable left atrial enlargement Nonspecific intraventricular conduction delay Borderline T abnormalities, lateral leads t-wave flattening anterior precordial leads as compared to 10/21/17 Confirmed by Virgel Manifold [phone]) on 02/28/2018 7:09:56 AM   Radiology Dg Chest 2 View  Result Date: 02/27/2018 CLINICAL DATA:  Shortness of breath and chest pain EXAM: CHEST - 2 VIEW COMPARISON:  Chest radiograph 10/21/2017 FINDINGS: Remote median sternotomy for CABG. Mild cardiomegaly with calcific aortic atherosclerosis. Bilateral interstitial opacities and prominence of the parahilar vessels. No pleural effusion or pneumothorax. No focal consolidation. IMPRESSION: Mild cardiomegaly, aortic atherosclerosis (ICD10-I70.0) and mild pulmonary edema. Electronically Signed   By: Ulyses Jarred M.D.   On: 02/27/2018 16:56   Dg Chest Portable 1 View  Result Date: 02/28/2018 CLINICAL DATA:  Shortness of breath. History of HIV, syphilis, CHF, coronary artery disease, current smoker. EXAM: PORTABLE CHEST 1 VIEW COMPARISON:  PA and lateral chest x-ray of Feb 27, 2018 FINDINGS: The lungs are well-expanded. The interstitial markings are increased bilaterally especially at the lung bases. The cardiac silhouette is enlarged and the pulmonary vascularity engorged. There are post CABG changes. There is calcification in the wall of the aortic arch. There is old deformity of the right ribcage. IMPRESSION: Chronic bronchitic changes. Superimposed CHF with mild to moderate interstitial edema. No alveolar pneumonia is observed. Thoracic aortic atherosclerosis. Electronically Signed   By: David  Martinique M.D.   On: 02/28/2018 06:54    Procedures .Critical Care Performed by:  Jola Schmidt, MD Authorized by: Jola Schmidt, MD    CRITICAL CARE Performed by: Jola Schmidt Total critical care time: 33 minutes Critical care time was exclusive of separately billable procedures and treating other patients. Critical care was necessary to treat or prevent imminent or life-threatening deterioration. Critical care was time spent personally by me on the following activities: development of treatment plan with patient and/or surrogate as well as nursing, discussions with consultants, evaluation of patient's response to treatment, examination of patient, obtaining history from patient or surrogate, ordering and performing treatments and interventions, ordering and review of laboratory studies, ordering and review of radiographic studies, pulse oximetry and re-evaluation of patient's condition.   Medications Ordered in ED Medications  furosemide (LASIX) injection 60 mg (60 mg Intravenous Given 02/28/18 0622)     Initial Impression / Assessment and Plan / ED Course  I have reviewed the triage vital signs and the nursing notes.  Pertinent labs & imaging results that were available during my care of the patient were reviewed by me and considered in my medical decision making (see chart for details).     Chest x-ray personally reviewed by myself.  Demonstrates bilateral pulmonary edema.  Patient seems to be much more comfortable on BiPAP.  This appears to be CHF exacerbation.  IV Lasix now.  Suspect dietary indiscretion.  No clinical signs to suggest pneumonia at this time.  Treated by EMS for possible COPD exacerbation with Solu-Medrol, mag, bronchodilators with some improvement.  I suspect with diuresis he will begin to feel much better.  He likely can come off BiPAP in the next several hours.  Patient awaiting laboratory studies at this time.  Patient will need admission to the hospital for acute on chronic respiratory failure/congestive heart failure  exacerbation.  Final  Clinical Impressions(s) / ED Diagnoses   Final diagnoses:  None    ED Discharge Orders    None       Jola Schmidt, MD 02/28/18 [phone]

## 2018-02-28 NOTE — Consult Note (Signed)
[facility] Consult Note     Date: 02/28/2018                  Patient Name:  Jacob [name]  MRN: [id number]  DOB: 12-05-1950  Age / Sex: 67 y.o., male         PCP: Gildardo Cranker, DO                 Service Requesting Consult: Dr. Venora Maples                   Reason for Consult: Hyperkalemia            Chief Complaint:  Shortness of breath   HPI: 67 year old male history CHF, CAD, HIV, T2DM, anemia of chronic disease and COPD.  Pesenting with 5 days of worsening shortness of breath.  Outpatient chest x-ray demonstrated pulmonary edema.  He states his breathing worsened in the middle the night and he contacted EMS.  In the ED, received duo nebs x2, mag x2, and Solu-Medrol.  Breathing improving on BiPAP and pt appears much more comfortable.  IV diuresis started.  Nephrology consulted for hyperkalemia.    Past Medical History:  Diagnosis Date  . Arthritis   . Cataracts, bilateral    immature  . CHF (congestive heart failure) (HCC)    not on any meds  . Chronic back pain    stenosis  . Chronic kidney disease, unspecified   . Coronary atherosclerosis of artery bypass graft   . Coronary atherosclerosis of native coronary artery 2005   s/p surgery  . Drug abuse ([place])    hx; tested for cocaine as recently as 2/08. says he is not using drugs now - avoided defib. for this reason   . GERD (gastroesophageal reflux disease)    takes OTC meds as needed  . Glaucoma    uses eye drops daily  . Gout, unspecified    takes Allopurinol daily as well as Colchicine-if needed  . Hepatitis 1967   Hep C  . History of colon polyps    benign  . HTN (hypertension)    takes Coreg,Imdur.and Apresoline daily  . Human immunodeficiency virus (HIV) disease ([place])    takes Genvoya daily  . Hyperlipidemia    takes Atorvastatin daily  . Ischemic cardiomyopathy   . Muscle spasm    takes Zanaflex as needed  . Nocturia   . Peripheral neuropathy    takes gabapentin daily  . Pneumonia    hx of  . Shortness of breath dyspnea    rarely but if notices it then with exertion  . Syphilis, unspecified   . Type II or unspecified type diabetes mellitus without mention of complication, not stated as uncontrolled 2004   Lantus daily.Average fasting blood sugar 125-199   Past Surgical History:  Procedure Laterality Date  . BACK SURGERY    . CARDIAC SURGERY    . CHOLECYSTECTOMY    . COLONOSCOPY  2013   Dr.John Henrene Pastor   . CORONARY ARTERY BYPASS GRAFT    . laproscopic cholecystectomy  8/07  . left intertrochanteric hip fracture  2004   s/p intermedullary nail placement 2/08  . left transverse mandibular fracture  10/05  . LUMBAR LAMINECTOMY/DECOMPRESSION MICRODISCECTOMY N/A 02/29/2016   Procedure: Left L4-5 Lateral Recess Decompression, Removal Extradural Intraspinal Facet Cyst;  Surgeon: Marybelle Killings, MD;  Location: [facility];  Service: Orthopedics;  Laterality: N/A;   Family History  Problem Relation Age of Onset  .  Heart failure Father   . Hypertension Father   . Diabetes Brother   . Heart attack Brother   . Alzheimer's disease Mother   . Stroke Sister   . Diabetes Sister   . Alzheimer's disease Sister   . Hypertension Brother   . Diabetes Brother   . Drug abuse Brother   . Colon cancer Neg Hx    Social History:  reports that he has been smoking cigarettes.  He has been smoking about 0.00 packs per day for the past 41.00 years. He has never used smokeless tobacco. He reports that he drinks alcohol. He reports that he does not use drugs.  Allergies:  Allergies  Allergen Reactions  . Amphetamines Other (See Comments)    Pt is unaware of this -     (Not in a hospital admission)  Results for orders placed or performed during the hospital encounter of 02/28/18 (from the past 48 hour(s))  CBC     Status: Abnormal   Collection Time: 02/28/18  5:46 AM  Result Value Ref Range   WBC 10.5 4.0 - 10.5 K/uL   RBC 3.85 (L) 4.22 - 5.81 MIL/uL   Hemoglobin 12.6 (L) 13.0 - 17.0 g/dL    HCT 38.4 (L) 39.0 - 52.0 %   MCV 99.7 78.0 - 100.0 fL   MCH 32.7 26.0 - 34.0 pg   MCHC 32.8 30.0 - 36.0 g/dL   RDW 14.8 11.5 - 15.5 %   Platelets 167 150 - 400 K/uL    Comment: Performed at [facility], [place] [address] [place], [place] [address]  Comprehensive metabolic panel     Status: Abnormal   Collection Time: 02/28/18  5:46 AM  Result Value Ref Range   Sodium 136 135 - 145 mmol/L   Potassium 7.0 (HH) 3.5 - 5.1 mmol/L    Comment: SLIGHT HEMOLYSIS CRITICAL RESULT CALLED TO, READ BACK BY AND VERIFIED WITH: J.EASLEY,RN 0734 02/28/18 CLARK,S    Chloride 112 (H) 101 - 111 mmol/L   CO2 13 (L) 22 - 32 mmol/L   Glucose, Bld 202 (H) 65 - 99 mg/dL   BUN 55 (H) 6 - 20 mg/dL   Creatinine, Ser 4.68 (H) 0.61 - 1.24 mg/dL   Calcium 8.8 (L) 8.9 - 10.3 mg/dL   Total Protein 7.4 6.5 - 8.1 g/dL   Albumin 3.4 (L) 3.5 - 5.0 g/dL   AST 37 15 - 41 U/L   ALT 25 17 - 63 U/L   Alkaline Phosphatase 36 (L) 38 - 126 U/L   Total Bilirubin 1.0 0.3 - 1.2 mg/dL   GFR calc non Af Amer 12 (L) >60 mL/min   GFR calc Af Amer 13 (L) >60 mL/min    Comment: (NOTE) The eGFR has been calculated using the CKD EPI equation. This calculation has not been validated in all clinical situations. eGFR's persistently <60 mL/min signify possible Chronic Kidney Disease.    Anion gap 11 5 - 15    Comment: Performed at [facility] [address] [place], [place] [address]  Troponin I     Status: None   Collection Time: 02/28/18  5:46 AM  Result Value Ref Range   Troponin I <0.03 <0.03 ng/mL    Comment: Performed at [facility] [address] [place],  [address]  Brain natriuretic peptide     Status: Abnormal   Collection Time: 02/28/18  5:52 AM  Result Value Ref Range   B Natriuretic Peptide  691.4 (H) 0.0 - 100.0 pg/mL    Comment: SLIGHT HEMOLYSIS Performed at [facility], [place] [address], [place], [place] [address]   CBG monitoring, ED     Status: Abnormal   Collection Time:  02/28/18  1:37 PM  Result Value Ref Range   Glucose-Capillary 180 (H) 65 - 99 mg/dL   Dg Chest 2 View  Result Date: 02/27/2018 CLINICAL DATA:  Shortness of breath and chest pain EXAM: CHEST - 2 VIEW COMPARISON:  Chest radiograph 10/21/2017 FINDINGS: Remote median sternotomy for CABG. Mild cardiomegaly with calcific aortic atherosclerosis. Bilateral interstitial opacities and prominence of the parahilar vessels. No pleural effusion or pneumothorax. No focal consolidation. IMPRESSION: Mild cardiomegaly, aortic atherosclerosis (ICD10-I70.0) and mild pulmonary edema. Electronically Signed   By: Ulyses Jarred M.D.   On: 02/27/2018 16:56   Dg Chest Portable 1 View  Result Date: 02/28/2018 CLINICAL DATA:  Shortness of breath. History of HIV, syphilis, CHF, coronary artery disease, current smoker. EXAM: PORTABLE CHEST 1 VIEW COMPARISON:  PA and lateral chest x-ray of Feb 27, 2018 FINDINGS: The lungs are well-expanded. The interstitial markings are increased bilaterally especially at the lung bases. The cardiac silhouette is enlarged and the pulmonary vascularity engorged. There are post CABG changes. There is calcification in the wall of the aortic arch. There is old deformity of the right ribcage. IMPRESSION: Chronic bronchitic changes. Superimposed CHF with mild to moderate interstitial edema. No alveolar pneumonia is observed. Thoracic aortic atherosclerosis. Electronically Signed   By: David  Martinique M.D.   On: 02/28/2018 06:54   Review of Systems  Constitutional: Negative for chills and fever.  HENT: Negative for congestion.   Respiratory: Positive for shortness of breath. Negative for cough and wheezing.   Cardiovascular: Positive for leg swelling. Negative for chest pain.  Gastrointestinal: Negative for abdominal pain, nausea and vomiting.   Blood pressure (!) 169/97, pulse 64, temperature 97.6 F (36.4 C), temperature source Temporal, resp. rate 15, SpO2 96 %.   Physical Exam  General: Alert,  oriented x3  HENT: NCAT, EOMI, PERRL Neck: supple, normal ROM   Cardiovascular: RRR no MRG   Pulmonary/Chest: rales bilaterally, mild wheeze in right lung field Abdominal: Soft. NTND, +bs   Musculoskeletal: FROM x4 Neurological: alert, ox4, no focal deficits  Skin: Warm, dry  Psychiatric: He has a normal mood and affect. Judgment normal.   Assessment/Plan 1. Hyperkalemia: K+ 7.0, emergent HD -orders placed  2. CKD IV, worsening SCr 3.65>4.86, follows with Dr. [name]  3. Acute on chronic CHF - IV diuresis, fluid restriction  4. HIV  5. T2DM- controlled  6. anemia of chronic disease 7. COPD   Lovenia Kim, MD [place], PGY-2

## 2018-02-28 NOTE — ED Notes (Signed)
Up to the br 

## 2018-02-28 NOTE — H&P (Addendum)
History and Physical    Jacob [name] XTK:[id number] DOB: [birth year] DOA: 02/28/2018  PCP: Gildardo Cranker, DO Patient coming from: home  Chief Complaint: dyspnea  HPI: Jacob [name] is a very pleasant 67 y.o. male with medical history significant for HIV, CHF/SEM, chronic kidney disease, CAD status post CABG, diabetes with peripheral neuropathy, hypertension,hyperlipidemia, glaucoma presents to the emergency department with the chief complaint persistent worsening shortness of breath. Initial evaluation reveals acute respiratory distress likely related to acute on chronic combined heart failure in the setting of COPD exacerbation worsening kidney function. [facility] are asked to admit  Information is obtained from the chart and the patient. He states over the last week he's gradually developed worseningshortness of breath. He states it started with dyspnea on exertion that he noted given he has to climb 14 steps to get to his apartment. Associated symptoms include nonproductive cough, abdominal distention and episode of brief chest pain. He denies fever chills nausea vomiting. He denied headache dizziness syncope or near-syncope.He saw his primary care provider yesterday and chest xray done that demonstrated pulmonary edema. Note indicates concern for worsening HF. He states last night the shortness of breath at rest became worse he said "I could even talk. He called EMS. He was provided with 125 mg of Solu-Medrol as well as DuoNeb nebs 2 and Mag x2. He denies abdominal pain lower extremity edema or orthopnea. He denies dysuria hematuria frequency or urgency. He denies diarrhea constipation melena bright red blood per rectum. He does continue to smoke he is not on home oxygen.    ED Course:  In the emergency department he was in respiratory distress and provided with BiPAP. He is afebrile hemodynamically stable with a blood pressure high end of normal. He is provided with continuous nebs  and gradually weaned off of BiPAP. In addition he is hyperkalemic is provided with 10 units of insulin intravenously and calcium as well as 60 mg of Lasix IV. At the time of admission oxygen saturation level greater than 90% nasal cannula with an increased work of breathing. He is able to complete sentences.  Review of Systems: As per HPI otherwise all other systems reviewed and are negative.   Ambulatory Status: ambulates independently and is independent with ADLs no recent falls  Past Medical History:  Diagnosis Date  . Arthritis   . Cataracts, bilateral    immature  . CHF (congestive heart failure) (HCC)    not on any meds  . Chronic back pain    stenosis  . Chronic kidney disease, unspecified   . Coronary atherosclerosis of artery bypass graft   . Coronary atherosclerosis of native coronary artery 2005   s/p surgery  . Drug abuse ([place])    hx; tested for cocaine as recently as 2/08. says he is not using drugs now - avoided defib. for this reason   . GERD (gastroesophageal reflux disease)    takes OTC meds as needed  . Glaucoma    uses eye drops daily  . Gout, unspecified    takes Allopurinol daily as well as Colchicine-if needed  . Hepatitis 1967   Hep C  . History of colon polyps    benign  . HTN (hypertension)    takes Coreg,Imdur.and Apresoline daily  . Human immunodeficiency virus (HIV) disease ([place])    takes Genvoya daily  . Hyperlipidemia    takes Atorvastatin daily  . Ischemic cardiomyopathy   . Muscle spasm    takes Zanaflex as needed  .  Nocturia   . Peripheral neuropathy    takes gabapentin daily  . Pneumonia    hx of  . Shortness of breath dyspnea    rarely but if notices it then with exertion  . Syphilis, unspecified   . Type II or unspecified type diabetes mellitus without mention of complication, not stated as uncontrolled 2004   Lantus daily.Average fasting blood sugar 125-199    Past Surgical History:  Procedure Laterality Date  . BACK SURGERY     . CARDIAC SURGERY    . CHOLECYSTECTOMY    . COLONOSCOPY  2013   Dr.John Henrene Pastor   . CORONARY ARTERY BYPASS GRAFT    . laproscopic cholecystectomy  8/07  . left intertrochanteric hip fracture  2004   s/p intermedullary nail placement 2/08  . left transverse mandibular fracture  10/05  . LUMBAR LAMINECTOMY/DECOMPRESSION MICRODISCECTOMY N/A 02/29/2016   Procedure: Left L4-5 Lateral Recess Decompression, Removal Extradural Intraspinal Facet Cyst;  Surgeon: Marybelle Killings, MD;  Location: [facility];  Service: Orthopedics;  Laterality: N/A;    Social History   Socioeconomic History  . Marital status: Single    Spouse name: Not on file  . Number of children: 1  . Years of education: Not on file  . Highest education level: Not on file  Occupational History  . Occupation: retired  . Occupation: part time- [employer]  . Financial resource strain: Not on file  . Food insecurity:    Worry: Not on file    Inability: Not on file  . Transportation needs:    Medical: Not on file    Non-medical: Not on file  Tobacco Use  . Smoking status: Current Every Day Smoker    Packs/day: 0.00    Years: 41.00    Pack years: 0.00    Types: Cigarettes  . Smokeless tobacco: Never Used  . Tobacco comment: States started smoking again today  Substance and Sexual Activity  . Alcohol use: Yes    Comment: occasionally  . Drug use: No    Types: Cocaine    Comment: hx of crack/cocaine 64yr ago  . Sexual activity: Not Currently    Comment: declined condoms  Lifestyle  . Physical activity:    Days per week: Not on file    Minutes per session: Not on file  . Stress: Not on file  Relationships  . Social connections:    Talks on phone: Not on file    Gets together: Not on file    Attends religious service: Not on file    Active member of club or organization: Not on file    Attends meetings of clubs or organizations: Not on file    Relationship status: Not on file  . Intimate partner violence:     Fear of current or ex partner: Not on file    Emotionally abused: Not on file    Physically abused: Not on file    Forced sexual activity: Not on file  Other Topics Concern  . Not on file  Social History Narrative   Retired.       As of 05/2015:   Diet: No salt    Caffeine   Married: Single    [place]    Pets: No    Current/Past profession: N/A   Exercise: walks daily    Living Will: Yes    DNR   POA/HPOA: No        Allergies  Allergen Reactions  . Amphetamines Other (See Comments)    Pt is unaware of this -     Family History  Problem Relation Age of Onset  . Heart failure Father   . Hypertension Father   . Diabetes Brother   . Heart attack Brother   . Alzheimer's disease Mother   . Stroke Sister   . Diabetes Sister   . Alzheimer's disease Sister   . Hypertension Brother   . Diabetes Brother   . Drug abuse Brother   . Colon cancer Neg Hx     Prior to Admission medications   Medication Sig Start Date End Date Taking? Authorizing Provider  allopurinol (ZYLOPRIM) 100 MG tablet Take 1 tablet (100 mg total) by mouth daily. 10/11/17  Yes Carter, Monica, DO  ANORO ELLIPTA 62.5-25 MCG/INH AEPB INHALE 1 PUFF BY MOUTH ONCE DAILY 01/22/18  Yes Gildardo Cranker, DO  aspirin EC 81 MG tablet Take 81 mg by mouth daily.   Yes [provider]  atorvastatin (LIPITOR) 20 MG tablet TAKE 1 TABLET BY MOUTH EVERY DAY 02/20/18  Yes [name], Monica, DO  carvedilol (COREG) 25 MG tablet TAKE 1 TABLET BY MOUTH TWICE A DAY WITH MEALS 12/20/17  Yes [name], MD  Choline Fenofibrate (FENOFIBRIC ACID) 135 MG CPDR take 1 capsule by mouth once daily 09/20/17  Yes Reed, Tiffany L, DO  colchicine 0.6 MG tablet Take one tablet by mouth as needed for gout 02/23/18  Yes Gildardo Cranker, DO  furosemide (LASIX) 40 MG tablet Take 40 mg by mouth daily.   Yes [provider]  gabapentin (NEURONTIN) 300 MG capsule TAKE 1 CAPSULE BY MOUTH THREE TIMES A DAY  12/20/17  Yes Gildardo Cranker, DO  gentamicin cream (GARAMYCIN) 0.1 % Apply 1 application topically 3 (three) times daily. 02/21/18  Yes Evans, Brent M, DPM  GENVOYA 150-150-200-10 MG TABS tablet TAKE ONE TABLET BY MOUTH ONCE DAILY WITH BREAKFAST. STORE IN ORIGINALCONTAINER AT ROOM TEMPERATURE. 10/04/17  Yes [name], MD  Glucosamine-Chondroit-Vit C-Mn (GLUCOSAMINE CHONDR 1500 COMPLX PO) Take 2 tablets by mouth daily. Reported on 04/04/2016   Yes [provider]  HUMALOG KWIKPEN 100 UNIT/ML KiwkPen INJECT 12 UNITS SUBCUTANEOUSLY THREE TIMES A DAY BEFORE MEALS 01/09/18  Yes [name], [place], DO  hydrALAZINE (APRESOLINE) 25 MG tablet TAKE 1 TABLET BY MOUTH THREE TIMES DAILY 01/25/18  Yes Gildardo Cranker, DO  isosorbide mononitrate (IMDUR) 30 MG 24 hr tablet take 1 tablet by mouth once daily 06/22/17  Yes [name], MD  LANTUS SOLOSTAR 100 UNIT/ML Solostar Pen INJECT 70 UNITS SUBCUTANEOUSLY EVERY MORNING (35 UNITS ON EACH SIDE) 09/20/17  Yes Reed, Tiffany L, DO  latanoprost (XALATAN) 0.005 % ophthalmic solution Place 1 drop into both eyes at bedtime. Reported on 04/04/2016 04/23/12  Yes [provider]  Naftifine HCl (NAFTIN) 2 % CREA Apply to affected area twice daily. 10/25/17  Yes Hyatt, Max T, DPM  nitroGLYCERIN (NITROSTAT) 0.3 MG SL tablet Place 1 tablet (0.3 mg total) under the tongue as needed for chest pain (not to exceed 3 tablets in one day.). 01/08/18  Yes [name], MD  sulfamethoxazole-trimethoprim (BACTRIM DS,SEPTRA DS) 800-160 MG tablet Take 1 tablet by mouth 2 (two) times daily. Patient taking differently: Take 1 tablet by mouth daily.  02/21/18  Yes Edrick Kins, DPM  B-D UF III MINI PEN NEEDLES 31G X 5 MM MISC use four times a day 05/08/17   Gildardo Cranker, DO  Blood Glucose Monitoring Suppl (ONE  TOUCH ULTRA MINI) w/Device KIT 1 each by Does not apply route 2 (two) times daily. Dx: E11.40 08/15/17   Gildardo Cranker, DO  glucose blood test strip Use to test blood sugar  twice daily. E11.40 02/13/18   Gildardo Cranker, DO    Physical Exam: Vitals:   02/28/18 0544 02/28/18 0553 02/28/18 0615 02/28/18 0645  BP: (!) 189/85  137/75 132/78  Pulse:  77 69 66  Resp: (!) 22  (!) 21 18  Temp: 97.6 F (36.4 C)     TempSrc: Temporal     SpO2: 99% 100% 99% 99%     General:  Appears calm cooperative sitting up in bed with mild to moderate distress Eyes:  PERRL, EOMI, normal lids, iris ENT:  grossly normal hearing, lips & tongue, because membranes of his mouth are moist and pink Neck:  no LAD, masses or thyromegaly Cardiovascular:  RRR, no m/r/g. No LE edema.  Respiratory: mild to moderate increased work of breathing. He is using his abdominal accessory muscles. There is prolonged expiratory phase. Breath sounds are quite diminished throughout hear fine crackles right base no wheeze Abdomen:  Distended somewhat firm positive bowel sounds throughout no guarding or rebounding Skin:  no rash or induration seen on limited exam, healing foot ulcer on plantar aspect of right foot. No erythema drainage or odor Musculoskeletal:  grossly normal tone BUE/BLE, good ROM, no bony abnormality Psychiatric:  grossly normal mood and affect, speech fluent and appropriate, AOx3 Neurologic:  CN 2-12 grossly intact, moves all extremities in coordinated fashion, sensation intact  Labs on Admission: I have personally reviewed following labs and imaging studies  CBC: Recent Labs  Lab 02/27/18 1600 02/28/18 0546  WBC 5.7 10.5  NEUTROABS 2,491  --   HGB 12.8* 12.6*  HCT 36.8* 38.4*  MCV 95.8 99.7  PLT 186 845   Basic Metabolic Panel: Recent Labs  Lab 02/27/18 1600 02/28/18 0546  NA 135 136  K 6.7* 7.0*  CL 112* 112*  CO2 18* 13*  GLUCOSE 85 202*  BUN 50* 55*  CREATININE 3.99* 4.68*  CALCIUM 9.0 8.8*   GFR: Estimated Creatinine Clearance: 17.3 mL/min (A) (by C-G formula based on SCr of 4.68 mg/dL (H)). Liver Function Tests: Recent Labs  Lab 02/27/18 1600  02/28/18 0546  AST 24 37  ALT 16 25  ALKPHOS  --  36*  BILITOT 0.3 1.0  PROT 7.7 7.4  ALBUMIN  --  3.4*   No results for input(s): LIPASE, AMYLASE in the last 168 hours. No results for input(s): AMMONIA in the last 168 hours. Coagulation Profile: No results for input(s): INR, PROTIME in the last 168 hours. Cardiac Enzymes: Recent Labs  Lab 02/28/18 0546  TROPONINI <0.03   BNP (last 3 results) No results for input(s): PROBNP in the last 8760 hours. HbA1C: No results for input(s): HGBA1C in the last 72 hours. CBG: No results for input(s): GLUCAP in the last 168 hours. Lipid Profile: No results for input(s): CHOL, HDL, LDLCALC, TRIG, CHOLHDL, LDLDIRECT in the last 72 hours. Thyroid Function Tests: No results for input(s): TSH, T4TOTAL, FREET4, T3FREE, THYROIDAB in the last 72 hours. Anemia Panel: No results for input(s): VITAMINB12, FOLATE, FERRITIN, TIBC, IRON, RETICCTPCT in the last 72 hours. Urine analysis:    Component Value Date/Time   COLORURINE COLORLESS (A) 11/10/2016 0839   APPEARANCEUR CLEAR 11/10/2016 0839   LABSPEC 1.004 (L) 11/10/2016 0839   PHURINE 7.0 11/10/2016 0839   GLUCOSEU NEGATIVE 11/10/2016 0839   GLUCOSEU NEG  mg/dL 09/20/2010 2058   HGBUR NEGATIVE 11/10/2016 0839   HGBUR negative 05/31/2010 [place] 11/10/2016 [place] 11/10/2016 0839   PROTEINUR NEGATIVE 11/10/2016 0839   UROBILINOGEN 0.2 06/24/2015 2336   NITRITE NEGATIVE 11/10/2016 0839   LEUKOCYTESUR NEGATIVE 11/10/2016 0839    Creatinine Clearance: Estimated Creatinine Clearance: 17.3 mL/min (A) (by C-G formula based on SCr of 4.68 mg/dL (H)).  Sepsis Labs: '@LABRCNTIP' (procalcitonin:4,lacticidven:4) ) Recent Results (from the past 240 hour(s))  WOUND CULTURE     Status: None   Collection Time: 02/21/18 11:58 AM  Result Value Ref Range Status   MICRO NUMBER: [id number]  Final   SPECIMEN QUALITY: ADEQUATE  Final   SOURCE: WOUND  Final   STATUS:  FINAL  Final   GRAM STAIN:   Final    No white blood cells seen No epithelial cells seen Moderate Gram positive cocci in pairs Few Gram positive bacilli   RESULT:   Final    Growth of skin flora (note: Growth does not include S. aureus, beta-hemolytic Streptococci or P. aeruginosa).     Radiological Exams on Admission: Dg Chest 2 View  Result Date: 02/27/2018 CLINICAL DATA:  Shortness of breath and chest pain EXAM: CHEST - 2 VIEW COMPARISON:  Chest radiograph 10/21/2017 FINDINGS: Remote median sternotomy for CABG. Mild cardiomegaly with calcific aortic atherosclerosis. Bilateral interstitial opacities and prominence of the parahilar vessels. No pleural effusion or pneumothorax. No focal consolidation. IMPRESSION: Mild cardiomegaly, aortic atherosclerosis (ICD10-I70.0) and mild pulmonary edema. Electronically Signed   By: Ulyses Jarred M.D.   On: 02/27/2018 16:56   Dg Chest Portable 1 View  Result Date: 02/28/2018 CLINICAL DATA:  Shortness of breath. History of HIV, syphilis, CHF, coronary artery disease, current smoker. EXAM: PORTABLE CHEST 1 VIEW COMPARISON:  PA and lateral chest x-ray of Feb 27, 2018 FINDINGS: The lungs are well-expanded. The interstitial markings are increased bilaterally especially at the lung bases. The cardiac silhouette is enlarged and the pulmonary vascularity engorged. There are post CABG changes. There is calcification in the wall of the aortic arch. There is old deformity of the right ribcage. IMPRESSION: Chronic bronchitic changes. Superimposed CHF with mild to moderate interstitial edema. No alveolar pneumonia is observed. Thoracic aortic atherosclerosis. Electronically Signed   By: David  Martinique M.D.   On: 02/28/2018 06:54    EKG:  Reviewed personally Sinus rhythm Probable left atrial enlargement Nonspecific intraventricular conduction delay Borderline T abnormalities, lateral leads t-wave flattening anterior precordial leads as compared to  10/21/17  Assessment/Plan Principal Problem:   Acute respiratory distress Active Problems:   Human immunodeficiency virus (HIV) disease ([place])   TOBACCO ABUSE   CAD (coronary artery disease)   Acute on chronic combined systolic (congestive) and diastolic (congestive) heart failure (HCC)   Hyperkalemia   Ischemic cardiomyopathy   HTN (hypertension)   DM (diabetes mellitus) (HCC)   Acute renal failure superimposed on stage 3 chronic kidney disease (HCC)   CHF (congestive heart failure) (HCC)   Anemia of chronic disease   Right foot ulcer ([place])   COPD exacerbation ([place])   #1. Acute respiratory distress likely related to acute on chronic combined heart failure in the setting of COPD exacerbation and worsening kidney function. Chest x-ray reveals superimposed CHF mild to moderate edema. bNP 691. Abdominal distention. patient weaned off BiPAP in the emergency department after nebulizer Solu-Medrol IV Lasix.at time of admission  Continues with increased work of breathing but oxygen saturation level greater than 90% on nasal  cannula -Admit to step down -continue Lasix 40 mg twice a day -Continue scheduled nebulizers -obtain a 2-D echo -Continue oxygen supplementation -monitor oxygen saturation level and respiratory effort closely  #2. Acute on chronic combined systolic and diastolic heart failure. Recent echo in January of this year reveals an EF of 45-50%, moderate concentric hypertrophy. Chart review indicates cardiology visit last month notes Lasix dose increased 6 months prior with good results but also opines need for nephrology consult for worsening renal failure. Chest x-ray as noted above. BNP 691. He was provided with 60 mg of Lasix IV in the emergency department -Continue IV Lasix twice a day -Obtain a 2-D echo -Monitor intake and output -Obtain daily weights -Continue beta blocker and imdur  #3. Copd exacerbation. See #1. Patient is not on home oxygen. He is a lifelong  smoker. -Continue scheduled nebulizers -consider further solumedrol if no improvement -see above -may benefit OP PFT at some point  #4. Acute renal failure in the setting of chronic kidney disease stage III.reatinine 4.68. Chart review indicates baseline closer to 3. Also reveals consistent outward trend. Patient reveals he has an appointment with nephrology 2 weeks. Potassium is 7. -Lasix per above. -Monitor urine output -fluid restriction -Hold nephrotoxins as able -request nephrology consult  #5. Hyperkalemia. Related to #4. Somewhat hemolyzed  But potassium level 7. EKG as noted above.  Provided with IV insulin and calcium in ED -see #4 -recheck at noon -monitor   #6. Diabetes. Serum glucose 202 on admission. Home medications include Lantus and Humalog. -We'll continue Lantus at a slightly lower dose -Sliding scale insulin for optimal control -Obtain hemoglobin A1c  #7. CAD. Patient with one episode of brief left anterior chest pain. Chart review indicates Myoview 2016 not ischemic. He did have a CABG 2004. EKG as noted above. Troponin negative. -continue home meds  #8. Hypertension. Chart review indicates blood pressure difficult to control. At time of admission fair control. Home medications include Lipitor, Coreg, appressoline, imdur -continue home meds as noted.  #9. Right foot ulcer. Status post debridement several weeks ago. He is on antibiotics for this. Healing well. -Continue antibiotics and dressing changes  #10. HIV. Followed by dr [name] with ID. Chart review indicates visit with infectious disease 2 months ago. Note indicates infection under excellent long-term control. Recommended continuing [place] and follow-up in 6 months. Of note Dr. [name] indicates this medication does not have any adverse impact on renal function but indicates that should his renal function continued to deteriorate Genvoya will plan to be changed to individual components at renally  adjusted doses. -continue Genvoya for now -follow nephrology recommendations  -consider ID and/or pharmacy consult as indicated.     DVT prophylaxis: heparin  Code Status: full  Family Communication: none present  Disposition Plan: home  Consults called:  Dr Justin Mend nephrology Admission status: inpatient    Radene Gunning MD [facility]  If 7PM-7AM, please contact night-coverage [email or web address] Password [email or web address]  02/28/2018, 9:20 AM

## 2018-02-28 NOTE — ED Notes (Signed)
Renal diet tray ordered for patient.

## 2018-02-28 NOTE — ED Notes (Signed)
Pt's foot cleaned and dressed. Medication applied per Theodoro Grist., RN.

## 2018-02-28 NOTE — ED Notes (Signed)
Report called to justin on 6e

## 2018-02-28 NOTE — ED Notes (Signed)
PAGED ADMITTING PER RN  

## 2018-02-28 NOTE — ED Notes (Signed)
Pt states he is going to sing out AMA and go to [facility] if we don't get him his daily medications. Notified pharmacy tech of missing medications.

## 2018-02-28 NOTE — ED Notes (Signed)
Ordered tray for pt 

## 2018-02-28 NOTE — ED Notes (Signed)
Dietary delivered renal tray.

## 2018-02-28 NOTE — ED Notes (Signed)
The np came to talk to this pt  When she left the pt reports that he was not told that this was a one time  Thing  He does not trust anyone but his own personal dioctor.  I calledd dialysis and they say they cannot take him tonight until he has a dialysis catheter.  Dr Florene Glen is on call and he told me that the pt would not be dialkyzed until tomorrow

## 2018-02-28 NOTE — Progress Notes (Signed)
Pt requesting bipap be taken off at this point saying it is "killing him". States he cannot wear mask. Pt placed on 4L [place] at this time

## 2018-03-01 ENCOUNTER — Telehealth: Payer: Self-pay | Admitting: Behavioral Health

## 2018-03-01 DIAGNOSIS — R0603 Acute respiratory distress: Secondary | ICD-10-CM | POA: Diagnosis not present

## 2018-03-01 DIAGNOSIS — B20 Human immunodeficiency virus [HIV] disease: Secondary | ICD-10-CM

## 2018-03-01 DIAGNOSIS — N184 Chronic kidney disease, stage 4 (severe): Secondary | ICD-10-CM | POA: Diagnosis not present

## 2018-03-01 DIAGNOSIS — I130 Hypertensive heart and chronic kidney disease with heart failure and stage 1 through stage 4 chronic kidney disease, or unspecified chronic kidney disease: Secondary | ICD-10-CM | POA: Diagnosis not present

## 2018-03-01 DIAGNOSIS — I5043 Acute on chronic combined systolic (congestive) and diastolic (congestive) heart failure: Secondary | ICD-10-CM

## 2018-03-01 DIAGNOSIS — E1142 Type 2 diabetes mellitus with diabetic polyneuropathy: Secondary | ICD-10-CM | POA: Diagnosis not present

## 2018-03-01 DIAGNOSIS — H409 Unspecified glaucoma: Secondary | ICD-10-CM | POA: Diagnosis not present

## 2018-03-01 DIAGNOSIS — E875 Hyperkalemia: Secondary | ICD-10-CM | POA: Diagnosis not present

## 2018-03-01 DIAGNOSIS — J9600 Acute respiratory failure, unspecified whether with hypoxia or hypercapnia: Secondary | ICD-10-CM

## 2018-03-01 DIAGNOSIS — J441 Chronic obstructive pulmonary disease with (acute) exacerbation: Secondary | ICD-10-CM | POA: Diagnosis not present

## 2018-03-01 DIAGNOSIS — J449 Chronic obstructive pulmonary disease, unspecified: Secondary | ICD-10-CM | POA: Diagnosis not present

## 2018-03-01 DIAGNOSIS — E1122 Type 2 diabetes mellitus with diabetic chronic kidney disease: Secondary | ICD-10-CM | POA: Diagnosis not present

## 2018-03-01 DIAGNOSIS — N179 Acute kidney failure, unspecified: Secondary | ICD-10-CM | POA: Diagnosis not present

## 2018-03-01 DIAGNOSIS — M109 Gout, unspecified: Secondary | ICD-10-CM | POA: Diagnosis not present

## 2018-03-01 DIAGNOSIS — E785 Hyperlipidemia, unspecified: Secondary | ICD-10-CM | POA: Diagnosis not present

## 2018-03-01 DIAGNOSIS — I2510 Atherosclerotic heart disease of native coronary artery without angina pectoris: Secondary | ICD-10-CM | POA: Diagnosis not present

## 2018-03-01 DIAGNOSIS — N185 Chronic kidney disease, stage 5: Secondary | ICD-10-CM | POA: Diagnosis not present

## 2018-03-01 DIAGNOSIS — D638 Anemia in other chronic diseases classified elsewhere: Secondary | ICD-10-CM | POA: Diagnosis not present

## 2018-03-01 DIAGNOSIS — R0602 Shortness of breath: Secondary | ICD-10-CM | POA: Diagnosis not present

## 2018-03-01 DIAGNOSIS — D631 Anemia in chronic kidney disease: Secondary | ICD-10-CM | POA: Diagnosis not present

## 2018-03-01 DIAGNOSIS — I272 Pulmonary hypertension, unspecified: Secondary | ICD-10-CM | POA: Diagnosis not present

## 2018-03-01 DIAGNOSIS — J9601 Acute respiratory failure with hypoxia: Secondary | ICD-10-CM | POA: Diagnosis not present

## 2018-03-01 HISTORY — DX: Acute respiratory failure, unspecified whether with hypoxia or hypercapnia: J96.00

## 2018-03-01 LAB — RENAL FUNCTION PANEL
ALBUMIN: 3.2 g/dL — AB (ref 3.5–5.0)
Anion gap: 8 (ref 5–15)
BUN: 66 mg/dL — AB (ref 6–20)
CO2: 23 mmol/L (ref 22–32)
CREATININE: 4.22 mg/dL — AB (ref 0.61–1.24)
Calcium: 9 mg/dL (ref 8.9–10.3)
Chloride: 106 mmol/L (ref 101–111)
GFR calc Af Amer: 15 mL/min — ABNORMAL LOW (ref >60)
GFR, EST NON AFRICAN AMERICAN: 13 mL/min — AB (ref >60)
Glucose, Bld: 234 mg/dL — ABNORMAL HIGH (ref 65–99)
PHOSPHORUS: 3.7 mg/dL (ref 2.5–4.6)
Potassium: 5.4 mmol/L — ABNORMAL HIGH (ref 3.5–5.1)
Sodium: 137 mmol/L (ref 135–145)

## 2018-03-01 LAB — CBC
HCT: 35.2 % — ABNORMAL LOW (ref 39.0–52.0)
Hemoglobin: 11.7 g/dL — ABNORMAL LOW (ref 13.0–17.0)
MCH: 32.5 pg (ref 26.0–34.0)
MCHC: 33.2 g/dL (ref 30.0–36.0)
MCV: 97.8 fL (ref 78.0–100.0)
PLATELETS: 173 10*3/uL (ref 150–400)
RBC: 3.6 MIL/uL — ABNORMAL LOW (ref 4.22–5.81)
RDW: 14.3 % (ref 11.5–15.5)
WBC: 10.1 10*3/uL (ref 4.0–10.5)

## 2018-03-01 LAB — HEMOGLOBIN A1C
Hgb A1c MFr Bld: 6.5 % — ABNORMAL HIGH (ref 4.8–5.6)
MEAN PLASMA GLUCOSE: 139.85 mg/dL

## 2018-03-01 LAB — GLUCOSE, CAPILLARY: Glucose-Capillary: 177 mg/dL — ABNORMAL HIGH (ref 65–99)

## 2018-03-01 MED ORDER — ALTEPLASE 2 MG IJ SOLR: 2.0000 mg | Freq: Once | INTRAMUSCULAR | Status: DC | PRN

## 2018-03-01 MED ORDER — SODIUM CHLORIDE 0.9 % IV SOLN: 100.0000 mL | INTRAVENOUS | Status: DC | PRN

## 2018-03-01 MED ORDER — PENTAFLUOROPROP-TETRAFLUOROETH EX AERO
1.0000 | INHALATION_SPRAY | CUTANEOUS | Status: DC | PRN
Start: 2018-03-01 — End: 2018-03-01

## 2018-03-01 MED ORDER — ALBUTEROL SULFATE (2.5 MG/3ML) 0.083% IN NEBU: 2.5000 mg | INHALATION_SOLUTION | Freq: Two times a day (BID) | RESPIRATORY_TRACT | Status: DC

## 2018-03-01 MED ORDER — LIDOCAINE-PRILOCAINE 2.5-2.5 % EX CREA
1.0000 "application " | TOPICAL_CREAM | CUTANEOUS | Status: DC | PRN
  Filled 2018-03-01: qty 5

## 2018-03-01 MED ORDER — LIDOCAINE HCL (PF) 1 % IJ SOLN: 5.0000 mL | INTRAMUSCULAR | Status: DC | PRN

## 2018-03-01 MED ORDER — HEPARIN SODIUM (PORCINE) 1000 UNIT/ML DIALYSIS
1000.0000 [IU] | INTRAMUSCULAR | Status: DC | PRN
  Filled 2018-03-01: qty 1

## 2018-03-01 MED ORDER — FUROSEMIDE 80 MG PO TABS: 120.0000 mg | ORAL_TABLET | Freq: Two times a day (BID) | ORAL | 0 refills | Status: DC

## 2018-03-01 MED ORDER — FUROSEMIDE 10 MG/ML IJ SOLN
120.0000 mg | Freq: Two times a day (BID) | INTRAVENOUS | Status: DC
  Administered 2018-03-01: 120 mg via INTRAVENOUS
  Filled 2018-03-01: qty 10

## 2018-03-01 NOTE — Telephone Encounter (Signed)
Jacob [name] called stating he was just released from the hospital today and states he was told he may need his HIV medications adjusted because his potassium levels were extremely elevated (last K on discharge 5.4 highest 7.0 during admission).  Informed him writer would speak with  Dr. [name] and get back to him. Pricilla Riffle RN

## 2018-03-01 NOTE — Progress Notes (Signed)
[facility] Progress Note    Assessment/ Plan:    67 year old male history CHF, CAD, HIV, T2DM, anemia of chronic disease and COPD.  Pesenting with 5 days of worsening shortness of breath 2/2 CHF exacerbation.  Nephrology consulted for hyperkalemia.    1. Hyperkalemia: improved, K+ 7.0>5.4, with medical management.  HD advised on admission, however pt opted for medical management with outpatient follow-up with his nephrologist, follows with Dr. [name]. Discussed low K+ diet at home, pt expressed good understanding of this.  2. CKD IV, mild improvement SCr 4.70>4.22   3. Acute on chronic CHF - IV diuresis, fluid restriction  4. HIV - antiretroviral meds need to be renally adjusted.  Will f/u as outpatient with Dr. [name].   5. T2DM- controlled  6. anemia of chronic disease 7. COPD  Per Nephrology, signing off.  OK to discharge from renal standpoint with follow-up as outpatient with Dr. [name]. Patient agreeable to this plan.   Subjective:   Patient feels well this morning.  Anxious to go home.  No overnight events.  Has refused HD as he states he does not trust anyone other than his kidney dr.    Loletta Specter:   BP (!) 161/86 (BP Location: Left Arm)   Pulse 68   Temp 98.4 F (36.9 C) (Oral)   Resp 20   Ht 6\' 2"  (1.88 m)   Wt 201 lb 4.5 oz (91.3 kg)   SpO2 99%   BMI 25.84 kg/m   Intake/Output Summary (Last 24 hours) at 03/01/2018 1021 Last data filed at 03/01/2018 0700 Gross per 24 hour  Intake -  Output 1000 ml  Net -1000 ml   Weight change:   Physical Exam: General: Alert, oriented x3, NAD  HENT: NCAT, EOMI, PERRL  Neck:supple, normal ROM   Cardiovascular:RRR no MRG   Pulmonary/Chest:rales bilaterally, mild wheeze in right lung field Abdominal:Soft. NTND, +bs   Musculoskeletal:FROM x4 Neurological: alert, ox4, no focal deficits  Skin: Warm, dry, no rash    Imaging: Dg Chest 2 View  Result Date: 02/27/2018 CLINICAL DATA:  Shortness of breath and  chest pain EXAM: CHEST - 2 VIEW COMPARISON:  Chest radiograph 10/21/2017 FINDINGS: Remote median sternotomy for CABG. Mild cardiomegaly with calcific aortic atherosclerosis. Bilateral interstitial opacities and prominence of the parahilar vessels. No pleural effusion or pneumothorax. No focal consolidation. IMPRESSION: Mild cardiomegaly, aortic atherosclerosis (ICD10-I70.0) and mild pulmonary edema. Electronically Signed   By: Ulyses Jarred M.D.   On: 02/27/2018 16:56   Dg Chest Portable 1 View  Result Date: 02/28/2018 CLINICAL DATA:  Shortness of breath. History of HIV, syphilis, CHF, coronary artery disease, current smoker. EXAM: PORTABLE CHEST 1 VIEW COMPARISON:  PA and lateral chest x-ray of Feb 27, 2018 FINDINGS: The lungs are well-expanded. The interstitial markings are increased bilaterally especially at the lung bases. The cardiac silhouette is enlarged and the pulmonary vascularity engorged. There are post CABG changes. There is calcification in the wall of the aortic arch. There is old deformity of the right ribcage. IMPRESSION: Chronic bronchitic changes. Superimposed CHF with mild to moderate interstitial edema. No alveolar pneumonia is observed. Thoracic aortic atherosclerosis. Electronically Signed   By: David  Martinique M.D.   On: 02/28/2018 06:54    Labs: BMET Recent Labs  Lab 02/27/18 1600 02/28/18 0546 02/28/18 1640 03/01/18 0300  NA 135 136 137 137  K 6.7* 7.0* 6.8* 5.4*  CL 112* 112* 106 106  CO2 18* 13* 22 23  GLUCOSE 85 202* 292* 234*  BUN 50* 55* 60* 66*  CREATININE 3.99* 4.68* 4.70* 4.22*  CALCIUM 9.0 8.8* 9.4 9.0  PHOS  --   --  3.5 3.7   CBC Recent Labs  Lab 02/27/18 1600 02/28/18 0546 03/01/18 0300  WBC 5.7 10.5 10.1  NEUTROABS 2,491  --   --   HGB 12.8* 12.6* 11.7*  HCT 36.8* 38.4* 35.2*  MCV 95.8 99.7 97.8  PLT 186 167 173    Medications:    . albuterol  2.5 mg Nebulization BID  . allopurinol  100 mg Oral Daily  . aspirin EC  81 mg Oral Daily  .  atorvastatin  20 mg Oral Daily  . carvedilol  25 mg Oral BID WC  . colchicine  0.6 mg Oral Daily  . elvitegravir-cobicistat-emtricitabine-tenofovir  1 tablet Oral Q breakfast  . fenofibrate  54 mg Oral Daily  . gabapentin  300 mg Oral TID  . gentamicin cream  1 application Topical TID  . heparin  5,000 Units Subcutaneous Q8H  . hydrALAZINE  25 mg Oral TID  . insulin aspart  0-5 Units Subcutaneous QHS  . insulin aspart  0-9 Units Subcutaneous TID WC  . insulin glargine  50 Units Subcutaneous QHS  . isosorbide mononitrate  30 mg Oral Daily  . latanoprost  1 drop Both Eyes QHS  . sodium chloride flush  3 mL Intravenous Q12H     Lovenia Kim, MD [place] PGY-2

## 2018-03-01 NOTE — Discharge Instructions (Signed)
Chronic Kidney Disease, Adult Chronic kidney disease (CKD) happens when the kidneys are damaged during a time of 3 or more months. The kidneys are two organs that do many important jobs in the body. These jobs include:  Removing wastes and extra fluids from the blood.  Making hormones that maintain the amount of fluid in your tissues and blood vessels.  Making sure that the body has the right amount of fluids and chemicals.  Most of the time, this condition does not go away, but it can usually be controlled. Steps must be taken to slow down the kidney damage or stop it from getting worse. Otherwise, the kidneys may stop working. Follow these instructions at home:  Follow your diet as told by your doctor. You may need to avoid alcohol, salty foods (sodium), and foods that are high in potassium, calcium, and protein.  Take over-the-counter and prescription medicines only as told by your doctor. Do not take any new medicines unless your doctor says you can do that. These include vitamins and minerals. ? Medicines and nutritional supplements can make kidney damage worse. ? Your doctor may need to change how much medicine you take.  Do not use any tobacco products. These include cigarettes, chewing tobacco, and e-cigarettes. If you need help quitting, ask your doctor.  Keep all follow-up visits as told by your doctor. This is important.  Check your blood pressure. Tell your doctor if there are changes to your blood pressure.  Get to a healthy weight. Stay at that weight. If you need help with this, ask your doctor.  Start or continue an exercise plan. Try to exercise at least 30 minutes a day, 5 days a week.  Stay up-to-date with your shots (immunizations) as told by your doctor. Contact a doctor if:  Your symptoms get worse.  You have new symptoms. Get help right away if:  You have symptoms of end-stage kidney disease. These include: ? Headaches. ? Skin that is darker or lighter  than normal. ? Numbness in your hands or feet. ? Easy bruising. ? Having hiccups often. ? Chest pain. ? Shortness of breath. ? Stopping of menstrual periods in women.  You have a fever.  You are making very little pee (urine).  You have pain or bleeding when you pee (urinate). This information is not intended to replace advice given to you by your health care provider. Make sure you discuss any questions you have with your health care provider. Document Released: 01/04/2010 Document Revised: 03/17/2016 Document Reviewed: 06/08/2012 Elsevier Interactive Patient Education  2017 [place].   Hyperkalemia Hyperkalemia is when you have too much potassium in your blood. Potassium is normally removed (excreted) from your body by your kidneys. If there is too much potassium in your blood, it can affect how your heart works. Follow these instructions at home:  Take medicines only as told by your doctor.  Do not take any supplements, natural products, herbs, or vitamins unless your doctor says it is okay.  Limit your alcohol intake as told by your doctor.  Stop illegal drug use. If you need help quitting, ask your doctor.  Keep all follow-up visits as told by your doctor. This is important.  If you have kidney disease, you may need to follow a low potassium diet. A food specialist (dietitian) can help you. Contact a doctor if:  Your heartbeat is not regular or very slow.  You feel dizzy (light-headed).  You feel weak.  You feel sick to your  stomach (nauseous).  You have tingling in your hands or feet.  You cannot feel your hands or feet. Get help right away if:  You are short of breath.  You have chest pain.  You pass out (faint).  You cannot move your muscles. This information is not intended to replace advice given to you by your health care provider. Make sure you discuss any questions you have with your health care provider. Document Released: 10/10/2005 Document  Revised: 03/17/2016 Document Reviewed: 01/15/2014 Elsevier Interactive Patient Education  2018 [place] With Heart Failure  Heart failure is a long-term (chronic) condition in which the heart cannot pump enough blood through the body. When this happens, parts of the body do not get the blood and oxygen they need. There is no cure for heart failure at this time, so it is important for you to take good care of yourself and follow the treatment plan set by your health care provider. If you are living with heart failure, there are ways to help you manage the disease. Follow these instructions at home: Living with heart failure requires you to make changes in your life. Your health care team will teach you about the changes you need to make in order to relieve your symptoms and lower your risk of going to the hospital. Follow the treatment plan as set by your health care provider. Medicines Medicines are important in reducing your heart's workload, slowing the progression of heart failure, and improving your symptoms.  Take over-the-counter and prescription medicines only as told by your health care provider.  Do not stop taking your medicine unless your health care provider tells you to do that.  Do not skip any dose of your medicine.  Refill prescriptions before you run out of medicine. You need your medicines every day.  Eating and drinking  Eat heart-healthy foods. Talk with a dietitian to make an eating plan that is right for you. ? If directed by your health care provider: ? Limit salt (sodium). Lowering your sodium intake may reduce symptoms of heart failure. Ask a dietitian to recommend heart-healthy seasonings. ? Limit your fluid intake. Fluid restriction may reduce symptoms of heart failure. ? Use low-fat cooking methods instead of frying. Low-fat methods include roasting, grilling, broiling, baking, poaching, steaming, and stir-frying. ? Choose foods that contain no  trans fat and are low in saturated fat and cholesterol. Healthy choices include fresh or frozen fruits and vegetables, fish, lean meats, legumes, fat-free or low-fat dairy products, and whole-grain or high-fiber foods.  Limit alcohol intake to no more than 1 drink a day for nonpregnant women and 2 drinks a day for men. One drink equals 12 oz of beer, 5 oz of wine, or 1 oz of hard liquor. ? Drinking more than that is harmful to your heart. Tell your health care provider if you drink alcohol several times a week. ? Talk with your health care provider about whether any level of alcohol use is safe for you. Activity  Ask your health care provider about attending cardiac rehabilitation. These programs include aerobic physical activity, which provides many benefits for your heart.  If no cardiac rehabilitation program is available, ask your health care provider what aerobic exercises are safe for you to do. Lifestyle Make the lifestyle changes recommended by your health care provider. In general:  Lose weight if your health care provider tells you to do that. Weight loss may reduce symptoms of heart failure.  Do not  use any products that contain nicotine or tobacco, such as cigarettes or e-cigarettes. If you need help quitting, ask your health care provider.  Do not use street (illegal) drugs.  Return to your normal activities as told by your health care provider. Ask your health care provider what activities are safe for you.  General instructions  Make sure you weigh yourself every day to track your weight. Rapid weight gain may indicate an increase in fluid in your body and may increase the workload of your heart. ? Weigh yourself every morning. Do this after you urinate but before you eat breakfast. ? Wear the same type of clothing, without shoes, each time you weigh yourself. ? Weigh yourself on the same scale and in the same spot each time.  Living with chronic heart failure often leads  to emotions such as fear, stress, anxiety, and depression. If you feel any of these emotions and need help coping, contact your health care provider. Other ways to get help include: ? Talking to friends and family members about your condition. They can give you support and guidance. Explain your symptoms to them and, if comfortable, invite them to attend appointments or rehabilitation with you. ? Joining a support group for people with chronic heart failure. Talking with other people who have the same symptoms may give you new ways of coping with your disease and your emotions.  Stay up to date with your shots (vaccines). Staying current on pneumococcal and influenza vaccines is especially important in preventing germs from attacking your airways (respiratory infections).  Keep all follow-up visits as told by your health care provider. This is important. How to recognize changes in your condition You and your family members need to know what changes to watch for in your condition. Watch for the following changes and report them to your health care provider:  Sudden weight gain. Ask your health care provider what amount of weight gain to report.  Shortness of breath: ? Feeling short of breath while at rest, with no exercise or activity that required great effort. ? Feeling breathless with activity.  Swelling of your lower legs or ankles.  Difficulty sleeping: ? You wake up feeling short of breath. ? You have to use more pillows to raise your head in order to sleep.  Frequent, dry, hacking cough.  Loss of appetite.  Feeling more tired all the time.  Depression or feelings of sadness or hopelessness.  Bloating in the stomach.  Where to find more information  Local support groups. Ask your health care provider about groups near you.  The American Heart Association: www.heart.org Contact a health care provider if:  You have a rapid weight gain.  You have increasing shortness of  breath that is unusual for you.  You are unable to participate in your usual physical activities.  You tire easily.  You cough more than normal, especially with physical activity.  You have any swelling or more swelling in areas such as your hands, feet, ankles, or abdomen.  You feel like your heart is beating quickly (palpitations).  You become dizzy or light-headed when you stand up. Get help right away if:  You have difficulty breathing.  You notice or your family notices a change in your awareness, such as having trouble staying awake or having difficulty with concentration.  You have pain or discomfort in your chest.  You have an episode of fainting (syncope). Summary  There is no cure for heart failure, so it is important  for you to take good care of yourself and follow the treatment plan set by your health care provider.  Medicines are important in reducing your heart's workload, slowing the progression of heart failure, and improving your symptoms.  Living with chronic heart failure often leads to emotions such as fear, stress, anxiety, and depression. If you are feeling any of these emotions and need help coping, contact your health care provider. This information is not intended to replace advice given to you by your health care provider. Make sure you discuss any questions you have with your health care provider. Document Released: 02/22/2017 Document Revised: 02/22/2017 Document Reviewed: 02/22/2017 Elsevier Interactive Patient Education  2018 [employer].

## 2018-03-01 NOTE — Progress Notes (Signed)
Potassium level reported to [facility] at this time.

## 2018-03-01 NOTE — Discharge Summary (Signed)
PATIENT DETAILS Name: Jacob [name] Age: 67 y.o. Sex: male Date of Birth: 1951-04-26 MRN: [id number]. Admitting Physician: Karmen Bongo, MD LNL:[id number], [place], [place]  Admit Date: 02/28/2018 Discharge date: 03/01/2018  Recommendations for Outpatient Follow-up:  1. Follow up with PCP in 1-2 weeks 2. Please obtain BMP/CBC in one week 3. Avoid Bactrim 4. Will likely require placement of dialysis access in the near future.  Admitted From:  Home  Disposition: [place]: No  Equipment/Devices: None  Discharge Condition: Stable  CODE STATUS: FULL CODE  Diet recommendation:  Heart Healthy / Carb Modified/low potassium diet  Brief Summary: See H&P, Labs, Consult and Test reports for all details in brief, patient is a 67 year old male with chronic kidney disease stage IV, HIV, diabetes and hypertension-admitted with shortness of breath-likely secondary to mild decompensated systolic heart failure in the setting of worsening renal function, he was also found to have hyperkalemia.  He was admitted to the hospitalist service.  See below for further details.  Brief Hospital Course: Acute hypoxic respiratory failure secondary to decompensated compensated systolic and diastolic heart failure (EF 45-50% by TTE 5/8) in the setting of worsening renal function: Rapidly improved after initiation of diuretics-did briefly required BiPAP while in the emergency room.  Seen by nephrology-initially hemodialysis offered-but the patient refused-he also does not have any access for dialysis.  Per patient, he wants to go home today-and will follow up with his primary nephrologist Dr. [name] and his primary care practitioner Dr. Sharman Crate has been been following with these physicians for quite a while-and wants to only start hemodialysis after talking to these MDs.  Thankfully patient's potassium has now markedly improved-he is not short of breath-in fact he is much better and thinks he is back to  his baseline-as of now-there are no acute indications for hemodialysis.  He was seen by nephrologist-Dr. Edrick Oh earlier this morning-who I spoke to with-recommendations are to increase his Lasix to 120 mg p.o. twice daily (only taking 40 mg BID).  It seems like he was recently started on Bactrim-which could account for hyperkalemia worsening renal function as well.    Acute kidney injury on chronic kidney disease: Probably secondary to Bactrim or just progression of underlying chronic kidney disease.  See above discussion.  Patient is aware he needs to follow with his primary nephrologist-and begin discussion for hemodialysis access-as he may probably require hemodialysis initiation in the near future.  Hyperkalemia: Could have been from initiation of Bactrim (on home medication list)-resolved after numerous doses of Kayexalate and Lasix.  Will be discharged on increased dose of Lasix as noted above.  Pulmonary hypertension/right-sided heart failure: This is a new finding-his hypoxia/shortness of breath is markedly improved-patient really wants to follow-up with his regular doctors that he knows very well-this is stable for outpatient follow-up   COPD: Stable-suspect shortness of breath was from above issues rather than COPD exacerbation.   DM-2: CBG stable-resume usual dosing of insulin  HIV: Resume antiretrovirals.  Hypertension: Stable-resume usual antihypertensives  Rest of her medical issues were stable during his hospital stay  Procedures/Studies: None  Discharge Diagnoses:  Principal Problem:   Acute respiratory distress Active Problems:   Human immunodeficiency virus (HIV) disease (HCC)   TOBACCO ABUSE   CAD (coronary artery disease)   Acute on chronic combined systolic (congestive) and diastolic (congestive) heart failure (HCC)   Hyperkalemia   Ischemic cardiomyopathy   HTN (hypertension)   DM (diabetes mellitus) ([place])   Acute renal failure superimposed  on stage 3  chronic kidney disease (HCC)   CHF (congestive heart failure) (HCC)   Anemia of chronic disease   Right foot ulcer (HCC)   COPD exacerbation (HCC)   Discharge Instructions:  Activity:  As tolerated   Discharge Instructions    Call MD for:  difficulty breathing, headache or visual disturbances   Complete by:  As directed    Call MD for:  persistant nausea and vomiting   Complete by:  As directed    Diet - low sodium heart healthy   Complete by:  As directed    Stay on a low potassium diet   Discharge instructions   Complete by:  As directed    Follow with Primary MD  Gildardo Cranker, DO  And Dr [name] in 1 week  Stay on a low Potassium diet  Please get a complete blood count and chemistry panel checked by your Primary MD at your next visit, and again as instructed by your Primary MD.  Get Medicines reviewed and adjusted: Please take all your medications with you for your next visit with your Primary MD  Laboratory/radiological data: Please request your Primary MD to go over all hospital tests and procedure/radiological results at the follow up, please ask your Primary MD to get all Hospital records sent to his/her office.  In some cases, they will be blood work, cultures and biopsy results pending at the time of your discharge. Please request that your primary care M.D. follows up on these results.  Also Note the following: If you experience worsening of your admission symptoms, develop shortness of breath, life threatening emergency, suicidal or homicidal thoughts you must seek medical attention immediately by calling 911 or calling your MD immediately  if symptoms less severe.  You must read complete instructions/literature along with all the possible adverse reactions/side effects for all the Medicines you take and that have been prescribed to you. Take any new Medicines after you have completely understood and accpet all the possible adverse reactions/side effects.   Do not  drive when taking Pain medications or sleeping medications (Benzodaizepines)  Do not take more than prescribed Pain, Sleep and Anxiety Medications. It is not advisable to combine anxiety,sleep and pain medications without talking with your primary care practitioner  Special Instructions: If you have smoked or chewed Tobacco  in the last 2 yrs please stop smoking, stop any regular Alcohol  and or any Recreational drug use.  Wear Seat belts while driving.  Please note: You were cared for by a hospitalist during your hospital stay. Once you are discharged, your primary care physician will handle any further medical issues. Please note that NO REFILLS for any discharge medications will be authorized once you are discharged, as it is imperative that you return to your primary care physician (or establish a relationship with a primary care physician if you do not have one) for your post hospital discharge needs so that they can reassess your need for medications and monitor your lab values.   Increase activity slowly   Complete by:  As directed      Allergies as of 03/01/2018      Reactions   Amphetamines Other (See Comments)   Pt is unaware of this -       Medication List    STOP taking these medications   sulfamethoxazole-trimethoprim 800-160 MG tablet Commonly known as:  BACTRIM DS,SEPTRA DS     TAKE these medications   allopurinol 100 MG tablet Commonly known  as:  ZYLOPRIM Take 1 tablet (100 mg total) by mouth daily.   ANORO ELLIPTA 62.5-25 MCG/INH Aepb Generic drug:  umeclidinium-vilanterol INHALE 1 PUFF BY MOUTH ONCE DAILY   aspirin EC 81 MG tablet Take 81 mg by mouth daily.   atorvastatin 20 MG tablet Commonly known as:  LIPITOR TAKE 1 TABLET BY MOUTH EVERY DAY   B-D UF III MINI PEN NEEDLES 31G X 5 MM Misc Generic drug:  Insulin Pen Needle use four times a day   carvedilol 25 MG tablet Commonly known as:  COREG TAKE 1 TABLET BY MOUTH TWICE A DAY WITH MEALS     colchicine 0.6 MG tablet Take one tablet by mouth as needed for gout   Fenofibric Acid 135 MG Cpdr take 1 capsule by mouth once daily   furosemide 80 MG tablet Commonly known as:  LASIX Take 1.5 tablets (120 mg total) by mouth 2 (two) times daily. What changed:    medication strength  how much to take  when to take this   gabapentin 300 MG capsule Commonly known as:  NEURONTIN TAKE 1 CAPSULE BY MOUTH THREE TIMES A DAY   gentamicin cream 0.1 % Commonly known as:  GARAMYCIN Apply 1 application topically 3 (three) times daily.   GENVOYA 150-150-200-10 MG Tabs tablet Generic drug:  elvitegravir-cobicistat-emtricitabine-tenofovir TAKE ONE TABLET BY MOUTH ONCE DAILY WITH BREAKFAST. STORE IN ORIGINALCONTAINER AT ROOM TEMPERATURE.   GLUCOSAMINE CHONDR 1500 COMPLX PO Take 2 tablets by mouth daily. Reported on 04/04/2016   glucose blood test strip Use to test blood sugar twice daily. E11.40   HUMALOG KWIKPEN 100 UNIT/ML KiwkPen Generic drug:  insulin lispro INJECT 12 UNITS SUBCUTANEOUSLY THREE TIMES A DAY BEFORE MEALS   hydrALAZINE 25 MG tablet Commonly known as:  APRESOLINE TAKE 1 TABLET BY MOUTH THREE TIMES DAILY   isosorbide mononitrate 30 MG 24 hr tablet Commonly known as:  IMDUR take 1 tablet by mouth once daily   LANTUS SOLOSTAR 100 UNIT/ML Solostar Pen Generic drug:  Insulin Glargine INJECT 70 UNITS SUBCUTANEOUSLY EVERY MORNING (35 UNITS ON EACH SIDE)   latanoprost 0.005 % ophthalmic solution Commonly known as:  XALATAN Place 1 drop into both eyes at bedtime. Reported on 04/04/2016   Naftifine HCl 2 % Crea Commonly known as:  NAFTIN Apply to affected area twice daily.   nitroGLYCERIN 0.3 MG SL tablet Commonly known as:  NITROSTAT Place 1 tablet (0.3 mg total) under the tongue as needed for chest pain (not to exceed 3 tablets in one day.).   ONE TOUCH ULTRA MINI w/Device Kit 1 each by Does not apply route 2 (two) times daily. Dx: E11.40      Follow-up  Information    Gildardo Cranker, DO. Schedule an appointment as soon as possible for a visit in 1 week(s).   Specialty:  Internal Medicine Contact information: [place] [address] [phone]        Madelon Lips, MD. Schedule an appointment as soon as possible for a visit in 1 week(s).   Specialty:  Nephrology Contact information: [address] [place] [address] [phone]          Allergies  Allergen Reactions  . Amphetamines Other (See Comments)    Pt is unaware of this -     Consultations:   nephrology  Other Procedures/Studies: Dg Chest 2 View  Result Date: 02/27/2018 CLINICAL DATA:  Shortness of breath and chest pain EXAM: CHEST - 2 VIEW COMPARISON:  Chest radiograph 10/21/2017  FINDINGS: Remote median sternotomy for CABG. Mild cardiomegaly with calcific aortic atherosclerosis. Bilateral interstitial opacities and prominence of the parahilar vessels. No pleural effusion or pneumothorax. No focal consolidation. IMPRESSION: Mild cardiomegaly, aortic atherosclerosis (ICD10-I70.0) and mild pulmonary edema. Electronically Signed   By: Ulyses Jarred M.D.   On: 02/27/2018 16:56   Dg Chest Portable 1 View  Result Date: 02/28/2018 CLINICAL DATA:  Shortness of breath. History of HIV, syphilis, CHF, coronary artery disease, current smoker. EXAM: PORTABLE CHEST 1 VIEW COMPARISON:  PA and lateral chest x-ray of Feb 27, 2018 FINDINGS: The lungs are well-expanded. The interstitial markings are increased bilaterally especially at the lung bases. The cardiac silhouette is enlarged and the pulmonary vascularity engorged. There are post CABG changes. There is calcification in the wall of the aortic arch. There is old deformity of the right ribcage. IMPRESSION: Chronic bronchitic changes. Superimposed CHF with mild to moderate interstitial edema. No alveolar pneumonia is observed. Thoracic aortic atherosclerosis. Electronically Signed   By: David  Martinique M.D.   On:  02/28/2018 06:54   Dg Foot Complete Right  Result Date: 02/21/2018 Please see detailed radiograph report in office note.     TODAY-DAY OF DISCHARGE:  Subjective:   Jacob [name] today has no headache,no chest abdominal pain,no new weakness tingling or numbness, feels much better wants to go home today.   Objective:   Blood pressure (!) 161/86, pulse 68, temperature 98.4 F (36.9 C), temperature source Oral, resp. rate 20, height '6\' 2"'  (1.88 m), weight 91.3 kg (201 lb 4.5 oz), SpO2 99 %.  Intake/Output Summary (Last 24 hours) at 03/01/2018 1010 Last data filed at 03/01/2018 0700 Gross per 24 hour  Intake -  Output 1000 ml  Net -1000 ml   Filed Weights   02/28/18 2000 03/01/18 0449  Weight: 91.4 kg (201 lb 9.6 oz) 91.3 kg (201 lb 4.5 oz)    Exam: Awake Alert, Oriented *3, No new F.N deficits, Normal affect [place].AT,PERRAL Supple Neck,No JVD, No cervical lymphadenopathy appriciated.  Symmetrical Chest wall movement, Good air movement bilaterally, CTAB RRR,No Gallops,Rubs or new Murmurs, No Parasternal Heave +ve B.Sounds, Abd Soft, Non tender, No organomegaly appriciated, No rebound -guarding or rigidity. No Cyanosis, Clubbing or edema, No new Rash or bruise   PERTINENT RADIOLOGIC STUDIES: Dg Chest 2 View  Result Date: 02/27/2018 CLINICAL DATA:  Shortness of breath and chest pain EXAM: CHEST - 2 VIEW COMPARISON:  Chest radiograph 10/21/2017 FINDINGS: Remote median sternotomy for CABG. Mild cardiomegaly with calcific aortic atherosclerosis. Bilateral interstitial opacities and prominence of the parahilar vessels. No pleural effusion or pneumothorax. No focal consolidation. IMPRESSION: Mild cardiomegaly, aortic atherosclerosis (ICD10-I70.0) and mild pulmonary edema. Electronically Signed   By: Ulyses Jarred M.D.   On: 02/27/2018 16:56   Dg Chest Portable 1 View  Result Date: 02/28/2018 CLINICAL DATA:  Shortness of breath. History of HIV, syphilis, CHF, coronary artery disease,  current smoker. EXAM: PORTABLE CHEST 1 VIEW COMPARISON:  PA and lateral chest x-ray of Feb 27, 2018 FINDINGS: The lungs are well-expanded. The interstitial markings are increased bilaterally especially at the lung bases. The cardiac silhouette is enlarged and the pulmonary vascularity engorged. There are post CABG changes. There is calcification in the wall of the aortic arch. There is old deformity of the right ribcage. IMPRESSION: Chronic bronchitic changes. Superimposed CHF with mild to moderate interstitial edema. No alveolar pneumonia is observed. Thoracic aortic atherosclerosis. Electronically Signed   By: David  Martinique M.D.   On: 02/28/2018 06:54  Dg Foot Complete Right  Result Date: 02/21/2018 Please see detailed radiograph report in office note.    PERTINENT LAB RESULTS: CBC: Recent Labs    02/28/18 0546 03/01/18 0300  WBC 10.5 10.1  HGB 12.6* 11.7*  HCT 38.4* 35.2*  PLT 167 173   CMET CMP     Component Value Date/Time   NA 137 03/01/2018 0300   NA 134 01/25/2018 1126   K 5.4 (H) 03/01/2018 0300   CL 106 03/01/2018 0300   CO2 23 03/01/2018 0300   GLUCOSE 234 (H) 03/01/2018 0300   BUN 66 (H) 03/01/2018 0300   BUN 72 (H) 01/25/2018 1126   CREATININE 4.22 (H) 03/01/2018 0300   CREATININE 3.99 (H) 02/27/2018 1600   CALCIUM 9.0 03/01/2018 0300   PROT 7.4 02/28/2018 0546   PROT 7.8 08/07/2015 1041   ALBUMIN 3.2 (L) 03/01/2018 0300   ALBUMIN 3.8 08/07/2015 1041   AST 37 02/28/2018 0546   ALT 25 02/28/2018 0546   ALKPHOS 36 (L) 02/28/2018 0546   BILITOT 1.0 02/28/2018 0546   BILITOT 0.5 08/07/2015 1041   GFRNONAA 13 (L) 03/01/2018 0300   GFRNONAA 14 (L) 02/27/2018 1600   GFRAA 15 (L) 03/01/2018 0300   GFRAA 17 (L) 02/27/2018 1600    GFR Estimated Creatinine Clearance: 19.2 mL/min (A) (by C-G formula based on SCr of 4.22 mg/dL (H)). No results for input(s): LIPASE, AMYLASE in the last 72 hours. Recent Labs    02/28/18 0546  TROPONINI <0.03   Invalid input(s):  POCBNP No results for input(s): DDIMER in the last 72 hours. Recent Labs    03/01/18 0300  HGBA1C 6.5*   No results for input(s): CHOL, HDL, LDLCALC, TRIG, CHOLHDL, LDLDIRECT in the last 72 hours. No results for input(s): TSH, T4TOTAL, T3FREE, THYROIDAB in the last 72 hours.  Invalid input(s): FREET3 No results for input(s): VITAMINB12, FOLATE, FERRITIN, TIBC, IRON, RETICCTPCT in the last 72 hours. Coags: No results for input(s): INR in the last 72 hours.  Invalid input(s): PT Microbiology: Recent Results (from the past 240 hour(s))  WOUND CULTURE     Status: None   Collection Time: 02/21/18 11:58 AM  Result Value Ref Range Status   MICRO NUMBER: [id number]  Final   SPECIMEN QUALITY: ADEQUATE  Final   SOURCE: WOUND  Final   STATUS: FINAL  Final   GRAM STAIN:   Final    No white blood cells seen No epithelial cells seen Moderate Gram positive cocci in pairs Few Gram positive bacilli   RESULT:   Final    Growth of skin flora (note: Growth does not include S. aureus, beta-hemolytic Streptococci or P. aeruginosa).    FURTHER DISCHARGE INSTRUCTIONS:  Get Medicines reviewed and adjusted: Please take all your medications with you for your next visit with your Primary MD  Laboratory/radiological data: Please request your Primary MD to go over all hospital tests and procedure/radiological results at the follow up, please ask your Primary MD to get all Hospital records sent to his/her office.  In some cases, they will be blood work, cultures and biopsy results pending at the time of your discharge. Please request that your primary care M.D. goes through all the records of your hospital data and follows up on these results.  Also Note the following: If you experience worsening of your admission symptoms, develop shortness of breath, life threatening emergency, suicidal or homicidal thoughts you must seek medical attention immediately by calling 911 or calling your MD immediately  if  symptoms less severe.  You must read complete instructions/literature along with all the possible adverse reactions/side effects for all the Medicines you take and that have been prescribed to you. Take any new Medicines after you have completely understood and accpet all the possible adverse reactions/side effects.   Do not drive when taking Pain medications or sleeping medications (Benzodaizepines)  Do not take more than prescribed Pain, Sleep and Anxiety Medications. It is not advisable to combine anxiety,sleep and pain medications without talking with your primary care practitioner  Special Instructions: If you have smoked or chewed Tobacco  in the last 2 yrs please stop smoking, stop any regular Alcohol  and or any Recreational drug use.  Wear Seat belts while driving.  Please note: You were cared for by a hospitalist during your hospital stay. Once you are discharged, your primary care physician will handle any further medical issues. Please note that NO REFILLS for any discharge medications will be authorized once you are discharged, as it is imperative that you return to your primary care physician (or establish a relationship with a primary care physician if you do not have one) for your post hospital discharge needs so that they can reassess your need for medications and monitor your lab values.  Total Time spent coordinating discharge including counseling, education and face to face time equals  45 minutes.  Signed: Shanker Ghimire 03/01/2018 10:10 AM

## 2018-03-02 ENCOUNTER — Telehealth: Payer: Self-pay

## 2018-03-02 ENCOUNTER — Telehealth: Payer: Self-pay | Admitting: *Deleted

## 2018-03-02 NOTE — Telephone Encounter (Signed)
Transition Care Management Follow-Up Telephone Call   Date discharged and where: [facility] on 03/01/2018  How have you been since you were released from the hospital? Patient states he has been feeling great. Has had no problems with breathing and when potassium was last checked on 5/9  it was 5.4 verses 6.8 on 5/8  Any patient concerns? None  Items Reviewed:   Meds: Y  Allergies: Y  Dietary Changes Reviewed: Y  Functional Questionnaire:  Independent-I Dependent-D  ADLs:   Dressing- I    Eating- I   Maintaining continence- I   Transferring- I   Transportation- Takes bus   Meal Prep- I   Managing Meds- I  Confirmed importance and Date/Time of follow-up visits scheduled: Yes, Dr. [name] 03/13/2018 @ 10:30:am   Confirmed with patient if condition worsens to call PCP or go to the Emergency Dept. Patient was given office number and encouraged to call back with questions or concerns: Yes

## 2018-03-02 NOTE — Telephone Encounter (Signed)
Patient called back and stated that his potassium at admission was 7 and when he was discharged it was 5.4.

## 2018-03-02 NOTE — Telephone Encounter (Signed)
Patient called for update.  RN offered him an appointment Tuesday with Dr [name] but patient could only come Monday.  RN spoke with pharmacy. They would be willing to accommodate Ray and collaborate with Dr [name]. Landis Gandy, RN

## 2018-03-02 NOTE — Telephone Encounter (Signed)
Patient called and stated that he received a call from our on call provider last night to go to the ER due to his Potassium. Patient wanted to call to let us know that he had already went to the ER on 02/28/2018 because he got dizzy. Patient stated that he was released yesterday. They increased his Lasix to 120mg  twice daily. D/C Bactrim and told him to call his Infectious Disease Provider to adjust his Gen[place]. Patient has placed a call to them.

## 2018-03-03 ENCOUNTER — Other Ambulatory Visit: Payer: Self-pay | Admitting: Internal Medicine

## 2018-03-05 ENCOUNTER — Ambulatory Visit (INDEPENDENT_AMBULATORY_CARE_PROVIDER_SITE_OTHER): Payer: Medicare Other | Admitting: Pharmacist Clinician (PhC)/ Clinical Pharmacy Specialist

## 2018-03-05 VITALS — Wt 199.4 lb

## 2018-03-05 DIAGNOSIS — B20 Human immunodeficiency virus [HIV] disease: Secondary | ICD-10-CM

## 2018-03-05 MED ORDER — BICTEGRAVIR-EMTRICITAB-TENOFOV 50-200-25 MG PO TABS: 1.0000 | ORAL_TABLET | Freq: Every day | ORAL | 11 refills | Status: DC

## 2018-03-05 NOTE — Progress Notes (Signed)
HPI: Jacob [name] is a 67 y.o. male who is here today do see if his antiretrovirals need to be adjusted.   Allergies: Allergies  Allergen Reactions  . Amphetamines Other (See Comments)    Pt is unaware of this -     Vitals:    Past Medical History: Past Medical History:  Diagnosis Date  . Arthritis    "all over; mostly knees and back" (02/28/2018)  . CHF (congestive heart failure) (HCC)    not on any meds  . Chronic lower back pain    stenosis  . CKD (chronic kidney disease), stage V (Jacob [name])    Jacob [name] 02/28/2018  . Coronary atherosclerosis of native coronary artery 2005   s/p surgery  . Drug abuse ([place])    hx; tested for cocaine as recently as 2/08. says he is not using drugs now - avoided defib. for this reason   . GERD (gastroesophageal reflux disease)    takes OTC meds as needed  . Glaucoma    uses eye drops daily  . Hepatitis B 1968   "tx'd w/isolation; caught it from toilet stools in gym"  . History of colon polyps    benign  . History of gout    takes Allopurinol daily as well as Colchicine-if needed (02/28/2018)  . History of kidney stones   . HTN (hypertension)    takes Coreg,Imdur.and Apresoline daily  . Human immunodeficiency virus (HIV) disease (Jacob [name]) dx'd 1995   takes Genvoya daily  . Hyperlipidemia    takes Atorvastatin daily  . Ischemic cardiomyopathy   . Muscle spasm    takes Zanaflex as needed  . Myocardial infarction (Jacob [name]) ~ 2004/2005  . Nocturia   . Peripheral neuropathy    takes gabapentin daily  . Pneumonia    "at least twice" (02/28/2018)  . Shortness of breath dyspnea    rarely but if notices it then with exertion  . Syphilis, unspecified   . Type II diabetes mellitus (Jacob [name]) 2004   Lantus daily.Average fasting blood sugar 125-199    Social History: Social History   Socioeconomic History  . Marital status: Single    Spouse name: Not on file  . Number of children: 1  . Years of education: Not on file  . Highest education level: Not on  file  Occupational History  . Occupation: retired  . Occupation: part time- Jacob [name]  . Financial resource strain: Not on file  . Food insecurity:    Worry: Not on file    Inability: Not on file  . Transportation needs:    Medical: Not on file    Non-medical: Not on file  Tobacco Use  . Smoking status: Current Every Day Smoker    Packs/day: 0.50    Years: 43.00    Pack years: 21.50    Types: Cigarettes  . Smokeless tobacco: Never Used  Substance and Sexual Activity  . Alcohol use: Yes    Comment: 02/28/2018 "maybe 4 beers/month"  . Drug use: No    Types: Cocaine    Comment: hx of crack/cocaine 24yrs ago  . Sexual activity: Not Currently  Lifestyle  . Physical activity:    Days per week: Not on file    Minutes per session: Not on file  . Stress: Not on file  Relationships  . Social connections:    Talks on phone: Not on file    Gets together: Not on file    Attends religious service: Not on file  Active member of club or organization: Not on file    Attends meetings of clubs or organizations: Not on file    Relationship status: Not on file  Other Topics Concern  . Not on file  Social History Narrative   Retired.       As of 05/2015:   Diet: No salt    Caffeine   Married: Single    House: Condo, 2 stories, 1 person (self)    Pets: No    Current/Past profession: N/A   Exercise: walks daily    Living Will: Yes    DNR   POA/HPOA: No        Previous Regimen: Reyataz/combivir, Stribild  Current Regimen: Genvoya  Labs: HIV 1 RNA Quant (copies/mL)  Date Value  12/12/2017 <20 NOT DETECTED  06/27/2017 <20 DETECTED (A)  06/28/2016 <20   CD4 T Cell Abs (/uL)  Date Value  12/12/2017 860  06/27/2017 970  06/28/2016 1,330   Hep B S Ab (no units)  Date Value  12/18/2006 NO   Hepatitis B Surface Ag (no units)  Date Value  06/30/2011 NEGATIVE   HCV Ab (no units)  Date Value  06/30/2011 NEGATIVE    CrCl: Estimated Creatinine Clearance:  19.2 mL/min (A) (by C-G formula based on SCr of 4.22 mg/dL (H)).  Lipids:    Component Value Date/Time   CHOL 170 01/08/2018 0827   CHOL 121 08/07/2015 1041   TRIG 436 (H) 01/08/2018 0827   HDL 31 (L) 01/08/2018 0827   HDL 32 (L) 08/07/2015 1041   CHOLHDL 5.5 (H) 01/08/2018 0827   VLDL 48 (H) 03/15/2017 0924   LDLCALC  01/08/2018 0827     Comment:     . LDL cholesterol not calculated. Triglyceride levels greater than 400 mg/dL invalidate calculated LDL results. . Reference range: <100 . Desirable range <100 mg/dL for primary prevention;   <70 mg/dL for patients with CHD or diabetic patients  with > or = 2 CHD risk factors. [name] LDL-C is now calculated using the Martin-Hopkins  calculation, which is a validated novel method providing  better accuracy than the Friedewald equation in the  estimation of LDL-C.  Cresenciano Genre et al. Annamaria Helling. 8119;147(82): 2061-2068  (http://education.[employer].com/faq/FAQ164)     Assessment: Jacob [name] is here today to see pharmacy to see if his antiretroviral needs to be adjusted due to his renal disease. He was recently admitted for respiratory failure due to his COPD and resp edema. He has a very complicated medical hx including CAD, CRI, CHF, HTN, DM. He is on quite a few meds everyday. His HIV is well controlled on Genvoya. Genvoya can be used in severe renal disease based on recent data. However, there is a potential for interactions with several of his meds especially atorvastatin. Since he is on so many tablets everyday, he would like to stay on as few pills as possible. Therefore, we will change him to [facility] to avoid the booster cobicistat.   We will repeat his bmet today to see see about his K+ since it was elevated while he was inpatient. He will come back in 6 wks so we can repeat labs on Jacob [name]. He will f/u with Dr. Megan [name] in Sept.   Recommendations:  Bmet today Dc Jacob [name] Start Biktarvy 1 PO qday F/u with pharmacy in 6 wks Labs  August before visit with Jacob [name], PharmD, BCPS, AAHIVP, CPP Clinical [facility] 03/05/2018, 2:31 PM

## 2018-03-06 LAB — BASIC METABOLIC PANEL
BUN/Creatinine Ratio: 18 (calc) (ref 6–22)
BUN: 78 mg/dL — ABNORMAL HIGH (ref 7–25)
CALCIUM: 8.9 mg/dL (ref 8.6–10.3)
CHLORIDE: 107 mmol/L (ref 98–110)
CO2: 21 mmol/L (ref 20–32)
Creat: 4.25 mg/dL — ABNORMAL HIGH (ref 0.70–1.25)
GLUCOSE: 89 mg/dL (ref 65–99)
Potassium: 5.1 mmol/L (ref 3.5–5.3)
Sodium: 138 mmol/L (ref 135–146)

## 2018-03-07 ENCOUNTER — Encounter: Payer: Self-pay | Admitting: Podiatry

## 2018-03-07 ENCOUNTER — Ambulatory Visit (INDEPENDENT_AMBULATORY_CARE_PROVIDER_SITE_OTHER): Payer: Medicare Other | Admitting: Podiatry

## 2018-03-07 DIAGNOSIS — E0842 Diabetes mellitus due to underlying condition with diabetic polyneuropathy: Secondary | ICD-10-CM

## 2018-03-07 DIAGNOSIS — I70235 Atherosclerosis of native arteries of right leg with ulceration of other part of foot: Secondary | ICD-10-CM

## 2018-03-07 DIAGNOSIS — L97512 Non-pressure chronic ulcer of other part of right foot with fat layer exposed: Secondary | ICD-10-CM | POA: Diagnosis not present

## 2018-03-09 DIAGNOSIS — E11621 Type 2 diabetes mellitus with foot ulcer: Secondary | ICD-10-CM | POA: Diagnosis not present

## 2018-03-09 DIAGNOSIS — N184 Chronic kidney disease, stage 4 (severe): Secondary | ICD-10-CM | POA: Diagnosis not present

## 2018-03-09 DIAGNOSIS — I5042 Chronic combined systolic (congestive) and diastolic (congestive) heart failure: Secondary | ICD-10-CM | POA: Diagnosis not present

## 2018-03-09 DIAGNOSIS — L97509 Non-pressure chronic ulcer of other part of unspecified foot with unspecified severity: Secondary | ICD-10-CM | POA: Diagnosis not present

## 2018-03-11 NOTE — Progress Notes (Signed)
Subjective:  67 year old male presenting today for follow up evaluation of an ulceration of the right foot. He states the ulceration is improving. There are no modifying factors noted. Patient is here for further evaluation and treatment.   Past Medical History:  Diagnosis Date  . Arthritis    "all over; mostly knees and back" (02/28/2018)  . CHF (congestive heart failure) (HCC)    not on any meds  . Chronic lower back pain    stenosis  . CKD (chronic kidney disease), stage V ([place])    [name] 02/28/2018  . Coronary atherosclerosis of native coronary artery 2005   s/p surgery  . Drug abuse ([place])    hx; tested for cocaine as recently as 2/08. says he is not using drugs now - avoided defib. for this reason   . GERD (gastroesophageal reflux disease)    takes OTC meds as needed  . Glaucoma    uses eye drops daily  . Hepatitis B 1968   "tx'd w/isolation; caught it from toilet stools in gym"  . History of colon polyps    benign  . History of gout    takes Allopurinol daily as well as Colchicine-if needed (02/28/2018)  . History of kidney stones   . HTN (hypertension)    takes Coreg,Imdur.and Apresoline daily  . Human immunodeficiency virus (HIV) disease ([place]) dx'd 1995   takes Genvoya daily  . Hyperlipidemia    takes Atorvastatin daily  . Ischemic cardiomyopathy   . Muscle spasm    takes Zanaflex as needed  . Myocardial infarction ([place]) ~ 2004/2005  . Nocturia   . Peripheral neuropathy    takes gabapentin daily  . Pneumonia    "at least twice" (02/28/2018)  . Shortness of breath dyspnea    rarely but if notices it then with exertion  . Syphilis, unspecified   . Type II diabetes mellitus ([place]) 2004   Lantus daily.Average fasting blood sugar 125-199      Objective/Physical Exam General: The patient is alert and oriented x3 in no acute distress.  Dermatology:  Wound #1 noted to the right foot measuring 0.5 x 0.3 x 0.1 cm (LxWxD).   To the noted ulceration(s), there is no  eschar. There is a moderate amount of slough, fibrin, and necrotic tissue noted. Granulation tissue and wound base is red. There is a minimal amount of serosanguineous drainage noted. There is no exposed bone muscle-tendon ligament or joint. There is no malodor. Periwound integrity is intact.  Vascular: Palpable pedal pulses bilaterally. No edema or erythema noted. Capillary refill within normal limits.  Neurological: Epicritic and protective threshold diminished bilaterally.   Musculoskeletal Exam: Range of motion within normal limits to all pedal and ankle joints bilateral. Muscle strength 5/5 in all groups bilateral.   Assessment: #1 ulceration to the right foot secondary to diabetes mellitus #2 diabetes mellitus w/ peripheral neuropathy   Plan of Care:  #1 Patient was evaluated.  #2 Medically necessary excisional debridement including subcutaneous tissue was performed using a tissue nipper and a chisel blade. Excisional debridement of all the necrotic nonviable tissue down to healthy bleeding viable tissue was performed with post-debridement measurements same as pre-. #3 The wound was cleansed and dry sterile dressing applied. #4 Continue using gentamicin cream daily with a Band-Aid.  #5 Discontinue wearing post op shoes. Resume wearing DM shoes.  #6 Return to clinic in 3 weeks.    Goes by [employer].   Edrick Kins, [place]  Dr. Edrick Kins, DPM    [address]                                        [place], [place] [address]                Office [phone]  Fax [phone]

## 2018-03-13 ENCOUNTER — Ambulatory Visit (INDEPENDENT_AMBULATORY_CARE_PROVIDER_SITE_OTHER): Payer: Medicare Other | Admitting: Nurse Practitioner

## 2018-03-13 ENCOUNTER — Encounter: Payer: Self-pay | Admitting: Nurse Practitioner

## 2018-03-13 ENCOUNTER — Encounter: Payer: Medicare Other | Admitting: Internal Medicine

## 2018-03-13 VITALS — BP 128/74 | HR 68 | Temp 98.0°F | Ht 74.0 in | Wt 199.6 lb

## 2018-03-13 DIAGNOSIS — E119 Type 2 diabetes mellitus without complications: Secondary | ICD-10-CM | POA: Diagnosis not present

## 2018-03-13 DIAGNOSIS — R0603 Acute respiratory distress: Secondary | ICD-10-CM

## 2018-03-13 DIAGNOSIS — N184 Chronic kidney disease, stage 4 (severe): Secondary | ICD-10-CM | POA: Diagnosis not present

## 2018-03-13 DIAGNOSIS — E875 Hyperkalemia: Secondary | ICD-10-CM | POA: Diagnosis not present

## 2018-03-13 DIAGNOSIS — E1122 Type 2 diabetes mellitus with diabetic chronic kidney disease: Secondary | ICD-10-CM | POA: Diagnosis not present

## 2018-03-13 DIAGNOSIS — B20 Human immunodeficiency virus [HIV] disease: Secondary | ICD-10-CM

## 2018-03-13 LAB — CBC WITH DIFFERENTIAL/PLATELET
BASOS PCT: 1.5 %
Basophils Absolute: 93 cells/uL (ref 0–200)
EOS PCT: 3.1 %
Eosinophils Absolute: 192 cells/uL (ref 15–500)
HCT: 36.9 % — ABNORMAL LOW (ref 38.5–50.0)
Hemoglobin: 12.8 g/dL — ABNORMAL LOW (ref 13.2–17.1)
Lymphs Abs: 2145 cells/uL (ref 850–3900)
MCH: 33 pg (ref 27.0–33.0)
MCHC: 34.7 g/dL (ref 32.0–36.0)
MCV: 95.1 fL (ref 80.0–100.0)
MONOS PCT: 10 %
MPV: 12.1 fL (ref 7.5–12.5)
Neutro Abs: 3150 cells/uL (ref 1500–7800)
Neutrophils Relative %: 50.8 %
PLATELETS: 222 10*3/uL (ref 140–400)
RBC: 3.88 10*6/uL — AB (ref 4.20–5.80)
RDW: 13.8 % (ref 11.0–15.0)
TOTAL LYMPHOCYTE: 34.6 %
WBC mixed population: 620 cells/uL (ref 200–950)
WBC: 6.2 10*3/uL (ref 3.8–10.8)

## 2018-03-13 LAB — BASIC METABOLIC PANEL WITH GFR
BUN / CREAT RATIO: 19 (calc) (ref 6–22)
BUN: 78 mg/dL — ABNORMAL HIGH (ref 7–25)
CALCIUM: 9.3 mg/dL (ref 8.6–10.3)
CO2: 17 mmol/L — ABNORMAL LOW (ref 20–32)
CREATININE: 4.11 mg/dL — AB (ref 0.70–1.25)
Chloride: 112 mmol/L — ABNORMAL HIGH (ref 98–110)
GFR, EST AFRICAN AMERICAN: 16 mL/min/{1.73_m2} — AB (ref >=60)
GFR, EST NON AFRICAN AMERICAN: 14 mL/min/{1.73_m2} — AB (ref >=60)
Glucose, Bld: 165 mg/dL — ABNORMAL HIGH (ref 65–139)
Potassium: 5.1 mmol/L (ref 3.5–5.3)
Sodium: 138 mmol/L (ref 135–146)

## 2018-03-13 MED ORDER — LANTUS SOLOSTAR 100 UNIT/ML ~~LOC~~ SOPN: PEN_INJECTOR | SUBCUTANEOUS | 6 refills | Status: DC

## 2018-03-13 MED ORDER — BD PEN NEEDLE MINI U/F 31G X 5 MM MISC: 11 refills | Status: DC

## 2018-03-13 NOTE — Progress Notes (Signed)
Careteam: Patient Care Team: Gildardo Cranker, DO as PCP - General (Internal Medicine) [name], MD as PCP - Infectious Diseases (Infectious Diseases) [name], MD as Consulting Physician (Ophthalmology) [name], MD as Consulting Physician (Cardiology) [name], MD as Consulting Physician (Infectious Diseases) Gardiner Barefoot, DPM as Consulting Physician (Podiatry)  Advanced Directive information Does Patient Have a Medical Advance Directive?: Yes, Type of Advance Directive: [place];Living will  Allergies  Allergen Reactions  . Amphetamines Other (See Comments)    Pt is unaware of this -     Chief Complaint  Patient presents with  . Transitions Of Care    Pt is being seen due to recent hospitalization at [facility] 5/8 to 5/9.      HPI: Patient is a 67 y.o. male seen in the office today for hospital follow up. Pt with hx of  chronic kidney disease stage IV, HIV, diabetes and hypertension- he was told to go to the hospital after being coming to office and labs revealed hyperkalemia, he was admitted with shortness of breath-likely secondary to mild decompensated systolic heart failure in the setting of worsening renal function. He improved after initiation of diuretics. Required breif Bipap in ED.  Due to progressive renal disease he was seen by nephrologist and offered HD but declined.   Hyperkalemia and AKI was thought to possible be from bactrim or from progression of underling CKD. Lasix was increased to 120 mg PO BID. He has already followed up with Dr [name] nephrologist, reports potassium level is down to 5.1, had to change diet as well to help reduce potassium. Has follow up next month (June 24) with [name] again.  Feeling "light as a feather"   Had lab work done on 03/05/18 which is in epic by ID and his HIV medication was changed.   Shortness of breath has improved and continues to improve. Weight continues to go down. No swelling in  LE. No chest pains.  Urination unchanged.   Review of Systems:  Review of Systems  Constitutional: Negative for chills, fever and malaise/fatigue.  Respiratory: Negative for cough and shortness of breath.   Cardiovascular: Negative for chest pain, palpitations and leg swelling.  Gastrointestinal: Negative for abdominal pain, constipation and diarrhea.  Genitourinary: Negative for dysuria and urgency.  Musculoskeletal: Negative.   Skin: Negative.   Neurological: Negative for dizziness and headaches.    Past Medical History:  Diagnosis Date  . Arthritis    "all over; mostly knees and back" (02/28/2018)  . CHF (congestive heart failure) (HCC)    not on any meds  . Chronic lower back pain    stenosis  . CKD (chronic kidney disease), stage V ([place])    [name] 02/28/2018  . Coronary atherosclerosis of native coronary artery 2005   s/p surgery  . Drug abuse ([place])    hx; tested for cocaine as recently as 2/08. says he is not using drugs now - avoided defib. for this reason   . GERD (gastroesophageal reflux disease)    takes OTC meds as needed  . Glaucoma    uses eye drops daily  . Hepatitis B 1968   "tx'd w/isolation; caught it from toilet stools in gym"  . History of colon polyps    benign  . History of gout    takes Allopurinol daily as well as Colchicine-if needed (02/28/2018)  . History of kidney stones   . HTN (hypertension)    takes Coreg,Imdur.and Apresoline daily  .  Human immunodeficiency virus (HIV) disease ([place]) dx'd 1995   takes Genvoya daily  . Hyperlipidemia    takes Atorvastatin daily  . Ischemic cardiomyopathy   . Muscle spasm    takes Zanaflex as needed  . Myocardial infarction ([place]) ~ 2004/2005  . Nocturia   . Peripheral neuropathy    takes gabapentin daily  . Pneumonia    "at least twice" (02/28/2018)  . Shortness of breath dyspnea    rarely but if notices it then with exertion  . Syphilis, unspecified   . Type II diabetes mellitus ([place]) 2004   Lantus  daily.Average fasting blood sugar 125-199   Past Surgical History:  Procedure Laterality Date  . BACK SURGERY    . CARDIAC CATHETERIZATION  10/2002; 12/19/2004   [name] 03/08/2011  . COLONOSCOPY  2013   Dr.John Henrene Pastor   . CORONARY ARTERY BYPASS GRAFT  02/24/2003   CABG X2/notes 03/08/2011  . FRACTURE SURGERY    . INTERTROCHANTERIC HIP FRACTURE SURGERY Left 11/2006   [name] 03/08/2011  . LAPAROSCOPIC CHOLECYSTECTOMY  05/2006  . LUMBAR LAMINECTOMY/DECOMPRESSION MICRODISCECTOMY N/A 02/29/2016   Procedure: Left L4-5 Lateral Recess Decompression, Removal Extradural Intraspinal Facet Cyst;  Surgeon: Marybelle Killings, MD;  Location: [facility];  Service: Orthopedics;  Laterality: N/A;  . ORIF MANDIBULAR FRACTURE Left 08/13/2004   ORIF of left body fracture mandible with KLS Martin 2.3-mm six hole/notes 03/08/2011   Social History:   reports that he has been smoking cigarettes.  He has a 21.50 pack-year smoking history. He has never used smokeless tobacco. He reports that he drinks alcohol. He reports that he does not use drugs.  Family History  Problem Relation Age of Onset  . Heart failure Father   . Hypertension Father   . Diabetes Brother   . Heart attack Brother   . Alzheimer's disease Mother   . Stroke Sister   . Diabetes Sister   . Alzheimer's disease Sister   . Hypertension Brother   . Diabetes Brother   . Drug abuse Brother   . Colon cancer Neg Hx     Medications: Patient's Medications  New Prescriptions   No medications on file  Previous Medications   ALLOPURINOL (ZYLOPRIM) 100 MG TABLET    Take 1 tablet (100 mg total) by mouth daily.   ANORO ELLIPTA 62.5-25 MCG/INH AEPB    INHALE 1 PUFF BY MOUTH ONCE DAILY   ASPIRIN EC 81 MG TABLET    Take 81 mg by mouth daily.   ATORVASTATIN (LIPITOR) 20 MG TABLET    TAKE 1 TABLET BY MOUTH EVERY DAY   B-D UF III MINI PEN NEEDLES 31G X 5 MM MISC    use four times a day   BICTEGRAVIR-EMTRICITABINE-TENOFOVIR AF (BIKTARVY) 50-200-25 MG TABS TABLET     Take 1 tablet by mouth daily.   BLOOD GLUCOSE MONITORING SUPPL (ONE TOUCH ULTRA MINI) W/DEVICE KIT    1 each by Does not apply route 2 (two) times daily. Dx: E11.40   CARVEDILOL (COREG) 25 MG TABLET    TAKE 1 TABLET BY MOUTH TWICE A DAY WITH MEALS   CHOLINE FENOFIBRATE (FENOFIBRIC ACID) 135 MG CPDR    take 1 capsule by mouth once daily   COLCHICINE 0.6 MG TABLET    Take one tablet by mouth as needed for gout   FUROSEMIDE (LASIX) 80 MG TABLET    Take 1.5 tablets (120 mg total) by mouth 2 (two) times daily.   GABAPENTIN (NEURONTIN) 300 MG CAPSULE  TAKE 1 CAPSULE BY MOUTH THREE TIMES A DAY   GENTAMICIN CREAM (GARAMYCIN) 0.1 %    Apply 1 application topically 3 (three) times daily.   GLUCOSAMINE-CHONDROIT-VIT C-MN (GLUCOSAMINE CHONDR 1500 COMPLX PO)    Take 2 tablets by mouth daily. Reported on 04/04/2016   GLUCOSE BLOOD TEST STRIP    Use to test blood sugar twice daily. E11.40   HUMALOG KWIKPEN 100 UNIT/ML KIWKPEN    INJECT 12 UNITS SUBCUTANEOUSLY THREE TIMES A DAY BEFORE MEALS   HYDRALAZINE (APRESOLINE) 25 MG TABLET    TAKE 1 TABLET BY MOUTH THREE TIMES DAILY   ISOSORBIDE MONONITRATE (IMDUR) 30 MG 24 HR TABLET    take 1 tablet by mouth once daily   LANTUS SOLOSTAR 100 UNIT/ML SOLOSTAR PEN    INJECT 70 UNITS SUBCUTANEOUSLY EVERY MORNING(35 UNITS ON EACH SIDE)   LATANOPROST (XALATAN) 0.005 % OPHTHALMIC SOLUTION    Place 1 drop into both eyes at bedtime. Reported on 04/04/2016   NITROGLYCERIN (NITROSTAT) 0.3 MG SL TABLET    Place 1 tablet (0.3 mg total) under the tongue as needed for chest pain (not to exceed 3 tablets in one day.).  Modified Medications   No medications on file  Discontinued Medications   No medications on file     Physical Exam:  Vitals:   03/13/18 1001  BP: 128/74  Pulse: 68  Temp: 98 F (36.7 C)  TempSrc: Oral  SpO2: 97%  Weight: 199 lb 9.6 oz (90.5 kg)  Height: _0  (1.88 m)   Body mass index is 25.63 kg/m.  Physical Exam  Constitutional: He is oriented to  person, place, and time. He appears well-developed and well-nourished.  HENT:  Mouth/Throat: Oropharynx is clear and moist.  Eyes: Pupils are equal, round, and reactive to light.  Neck: Neck supple. Carotid bruit is not present. No thyromegaly present.  Cardiovascular: Normal rate, regular rhythm and intact distal pulses. Exam reveals no gallop and no friction rub.  Murmur (1/6 SEM) heard. Pulmonary/Chest: Effort normal and breath sounds normal. He has no wheezes. He has no rales. He exhibits no tenderness.  Abdominal: Soft. Normal appearance and bowel sounds are normal. He exhibits no distension, no abdominal bruit and no pulsatile midline mass. There is no hepatomegaly. There is no tenderness. There is no rigidity.  Musculoskeletal: He exhibits no edema.  Neurological: He is alert and oriented to person, place, and time.  Skin: Skin is warm and dry.  Psychiatric: He has a normal mood and affect.    Labs reviewed: Basic Metabolic Panel: Recent Labs    07/18/17 0828  02/28/18 1640 03/01/18 0300 03/05/18 1443  NA 139   < > 137 137 138  K 5.5*   < > 6.8* 5.4* 5.1  CL 111*   < > 106 106 107  CO2 24   < > _1 GLUCOSE 73   < > 292* 234* 89  BUN 44*   < > 60* 66* 78*  CREATININE 2.79*   < > 4.70* 4.22* 4.25*  CALCIUM 9.5   < > 9.4 9.0 8.9  PHOS  --   --  3.5 3.7  --   TSH 0.81  --   --   --   --    < > = values in this interval not displayed.   Liver Function Tests: Recent Labs    03/15/17 0924 06/27/17 1028  01/08/18 0827 02/27/18 1600 02/28/18 0546 02/28/18 1640 03/01/18 0300  AST 25 24   < >  25 24 37  --   --   ALT 20 19   < > _0 --   --   ALKPHOS 65 54  --   --   --  36*  --   --   BILITOT 0.3 0.3   < > 0.4 0.3 1.0  --   --   PROT 7.5 7.4   < > 8.3* 7.7 7.4  --   --   ALBUMIN 3.6 3.6  --   --   --  3.4* 3.5 3.2*   < > = values in this interval not displayed.   No results for input(s): LIPASE, AMYLASE in the last 8760 hours. No results for input(s):  AMMONIA in the last 8760 hours. CBC: Recent Labs    02/27/18 1600 02/28/18 0546 03/01/18 0300  WBC 5.7 10.5 10.1  NEUTROABS 2,491  --   --   HGB 12.8* 12.6* 11.7*  HCT 36.8* 38.4* 35.2*  MCV 95.8 99.7 97.8  PLT 186 167 173   Lipid Panel: Recent Labs    03/15/17 0924 07/18/17 0828 10/09/17 0821 01/08/18 0827  CHOL 178 177 211* 170  HDL 30* 34* 39* 31*  LDLCALC 100* 104* 130*  --   TRIG 239* 295* 297* 436*  CHOLHDL 5.9* 5.2* 5.4* 5.5*   TSH: Recent Labs    07/18/17 0828  TSH 0.81   A1C: Lab Results  Component Value Date   HGBA1C 6.5 (H) 03/01/2018     Assessment/Plan 1. Diabetes mellitus without complication ([place]) -blood sugars in normal range, discussed monitoring for hypoglycemia with lifestyle modifications and to notify as we may need to adjust regimen.  -A1c at goal at this time.  - B-D UF III MINI PEN NEEDLES 31G X 5 MM MISC; use four times a day  Dispense: 100 each; Refill: 11 - LANTUS SOLOSTAR 100 UNIT/ML Solostar Pen; INJECT 70 UNITS SUBCUTANEOUSLY EVERY MORNING(35 UNITS ON EACH SIDE)  Dispense: 15 mL; Refill: 6  2. CKD stage 4 due to type 2 diabetes mellitus ([place]) -would like to avoid dialysis as long has possible. Has made lifestyle modifications and reports compliance with medication and diet. Following with nephrologist monthly.   - BASIC METABOLIC PANEL WITH GFR - CBC with Differential/Platelets  3. Human immunodeficiency virus (HIV) disease ([place]) Recently with adjustment to antiretroviral due to progression of renal disease. He was changed to [facility] to avoid interactions with other medications. He is following up in 6 weeks with ID office.   4. Hyperkalemia -K 5.1 on last lab, will follow up today.  - BASIC METABOLIC PANEL WITH GFR  5. Acute respiratory distress -resolved, shortness of breath continues to improve with lifestyle modifications and medication changes  - BASIC METABOLIC PANEL WITH GFR - CBC with Differential/Platelets  Next  appt: 05/08/2018  [name]. [place], [facility] [phone]

## 2018-03-22 ENCOUNTER — Telehealth: Payer: Self-pay | Admitting: *Deleted

## 2018-03-22 ENCOUNTER — Telehealth: Payer: Self-pay | Admitting: Pharmacist Clinician (PhC)/ Clinical Pharmacy Specialist

## 2018-03-22 DIAGNOSIS — E119 Type 2 diabetes mellitus without complications: Secondary | ICD-10-CM

## 2018-03-22 MED ORDER — LANTUS SOLOSTAR 100 UNIT/ML ~~LOC~~ SOPN: PEN_INJECTOR | SUBCUTANEOUS | 6 refills | Status: DC

## 2018-03-22 MED ORDER — INSULIN LISPRO 100 UNIT/ML (KWIKPEN): PEN_INJECTOR | SUBCUTANEOUS | 3 refills | Status: DC

## 2018-03-22 NOTE — Telephone Encounter (Signed)
Patient called back and also wanted to know your advice on what could be causing the back of his head to be hurting. Stated that is hurts when he moves his head forward. Please Advise.

## 2018-03-22 NOTE — Telephone Encounter (Signed)
Jacob [name] called to ask [place] if the Biktarvy could affect his blood sugar. It was running at 170 and 211 after two recent readings. Apparently, a [facility] pharmacist said it could be due to the [place]. Advised that this is not the case. He needs to contact Dr. [name] to adjust his insulin. He will do that now.

## 2018-03-22 NOTE — Telephone Encounter (Signed)
Patient notified and agreed. Medication list updated.  

## 2018-03-22 NOTE — Telephone Encounter (Signed)
Patient called and stated that he spoke with pharmacy regarding his new medication for HIV [facility])  is causing his blood sugars to run 145-252. This morning it was 272. Patient also stated that they also increased his Furosemide to 120mg  twice daily.  Patient thinks his insulin needs to be adjusted. Please Advise.

## 2018-03-22 NOTE — Telephone Encounter (Signed)
Increase lantus 74 units qPM; increase humalog 15 units qAC

## 2018-03-23 ENCOUNTER — Other Ambulatory Visit: Payer: Self-pay | Admitting: Internal Medicine

## 2018-03-28 ENCOUNTER — Ambulatory Visit (INDEPENDENT_AMBULATORY_CARE_PROVIDER_SITE_OTHER): Payer: Medicare Other | Admitting: Podiatry

## 2018-03-28 DIAGNOSIS — I70235 Atherosclerosis of native arteries of right leg with ulceration of other part of foot: Secondary | ICD-10-CM

## 2018-03-28 DIAGNOSIS — L989 Disorder of the skin and subcutaneous tissue, unspecified: Secondary | ICD-10-CM

## 2018-03-28 DIAGNOSIS — B351 Tinea unguium: Secondary | ICD-10-CM

## 2018-03-28 DIAGNOSIS — E0842 Diabetes mellitus due to underlying condition with diabetic polyneuropathy: Secondary | ICD-10-CM

## 2018-03-28 DIAGNOSIS — M79676 Pain in unspecified toe(s): Secondary | ICD-10-CM

## 2018-04-01 NOTE — Progress Notes (Signed)
Subjective: Patient is a 67 y.o. male presenting to the office today with a chief complaint of painful callus lesions to the bilateral feet that have been present for several weeks. Bearing weight increases the pain. He has not done anything for treatment.   Patient also complains of elongated, thickened nails that cause pain while ambulating in shoes. He is unable to trim her own nails. Patient presents today for further treatment and evaluation.  Past Medical History:  Diagnosis Date  . Arthritis    "all over; mostly knees and back" (02/28/2018)  . CHF (congestive heart failure) (HCC)    not on any meds  . Chronic lower back pain    stenosis  . CKD (chronic kidney disease), stage V ([place])    [name] 02/28/2018  . Coronary atherosclerosis of native coronary artery 2005   s/p surgery  . Drug abuse ([place])    hx; tested for cocaine as recently as 2/08. says he is not using drugs now - avoided defib. for this reason   . GERD (gastroesophageal reflux disease)    takes OTC meds as needed  . Glaucoma    uses eye drops daily  . Hepatitis B 1968   "tx'd w/isolation; caught it from toilet stools in gym"  . History of colon polyps    benign  . History of gout    takes Allopurinol daily as well as Colchicine-if needed (02/28/2018)  . History of kidney stones   . HTN (hypertension)    takes Coreg,Imdur.and Apresoline daily  . Human immunodeficiency virus (HIV) disease ([place]) dx'd 1995   takes Genvoya daily  . Hyperlipidemia    takes Atorvastatin daily  . Ischemic cardiomyopathy   . Muscle spasm    takes Zanaflex as needed  . Myocardial infarction ([place]) ~ 2004/2005  . Nocturia   . Peripheral neuropathy    takes gabapentin daily  . Pneumonia    "at least twice" (02/28/2018)  . Shortness of breath dyspnea    rarely but if notices it then with exertion  . Syphilis, unspecified   . Type II diabetes mellitus ([place]) 2004   Lantus daily.Average fasting blood sugar 125-199    Objective:    Physical Exam General: Alert and oriented x3 in no acute distress  Dermatology: Hyperkeratotic lesions present on the bilateral feet x 2. Pain on palpation with a central nucleated core noted. Skin is warm, dry and supple bilateral lower extremities. Negative for open lesions or macerations. Nails are tender, long, thickened and dystrophic with subungual debris, consistent with onychomycosis, 1-5 bilateral. No signs of infection noted.  Vascular: Palpable pedal pulses bilaterally. No edema or erythema noted. Capillary refill within normal limits.  Neurological: Epicritic and protective threshold diminished bilaterally.   Musculoskeletal Exam: Pain on palpation at the keratotic lesion noted. Range of motion within normal limits bilateral. Muscle strength 5/5 in all groups bilateral.  Assessment: 1. Onychodystrophic nails 1-5 bilateral with hyperkeratosis of nails.  2. Onychomycosis of nail due to dermatophyte bilateral 3. Pre-ulcerative callus lesions noted to the bilateral feet x 2   Plan of Care:  1. Patient evaluated. 2. Excisional debridement of keratoic lesion using a chisel blade was performed without incident.  3. Dressed with light dressing. 4. Mechanical debridement of nails 1-5 bilaterally performed using a nail nipper. Filed with dremel without incident.  5. Patient is to return to the clinic in 3 months.   Edrick Kins, DPM [facility]  Dr. Edrick Kins,  DPM    [address]                                        [place], [place] [address]                Office [phone]  Fax [phone]

## 2018-04-05 ENCOUNTER — Telehealth: Payer: Self-pay

## 2018-04-05 MED ORDER — INSULIN LISPRO 100 UNIT/ML (KWIKPEN): PEN_INJECTOR | SUBCUTANEOUS | 3 refills | Status: DC

## 2018-04-05 NOTE — Telephone Encounter (Signed)
Patient aware of Dr.Carter's response and verbalized understanding. New RX sent with increased units per patient request.

## 2018-04-05 NOTE — Telephone Encounter (Signed)
Patient called c/o elevated blood sugar. Patient states fasting blood sugar running high x 6 days. Yesterday blood sugar was 198, today 183. Lowest reading this week was 135.   Patient denies associated symptoms: Headache, blurred vision, increased thirst, trouble concentrating or extreme fatigue  Patient states he is taking all medications as prescribed  Please advise

## 2018-04-05 NOTE — Telephone Encounter (Signed)
Increase humalog 16 units qAC and cont lantus as ordered; check CBGs TID; goal CBG <200

## 2018-04-06 ENCOUNTER — Other Ambulatory Visit: Payer: Self-pay | Admitting: *Deleted

## 2018-04-06 MED ORDER — GLUCOSE BLOOD VI STRP: ORAL_STRIP | 3 refills | Status: DC

## 2018-04-06 NOTE — Telephone Encounter (Signed)
Patient has not received mail order and needs strips sent to local pharmacy. Faxed.

## 2018-04-06 NOTE — Telephone Encounter (Signed)
Patient called and requested to send Rx to [facility].

## 2018-04-09 ENCOUNTER — Other Ambulatory Visit: Payer: Self-pay | Admitting: *Deleted

## 2018-04-09 MED ORDER — GLUCOSE BLOOD VI STRP: ORAL_STRIP | 3 refills | Status: DC

## 2018-04-09 NOTE — Telephone Encounter (Signed)
[facility] 

## 2018-04-16 DIAGNOSIS — N184 Chronic kidney disease, stage 4 (severe): Secondary | ICD-10-CM | POA: Diagnosis not present

## 2018-04-16 DIAGNOSIS — I5042 Chronic combined systolic (congestive) and diastolic (congestive) heart failure: Secondary | ICD-10-CM | POA: Diagnosis not present

## 2018-04-16 DIAGNOSIS — Z720 Tobacco use: Secondary | ICD-10-CM | POA: Diagnosis not present

## 2018-04-16 DIAGNOSIS — I129 Hypertensive chronic kidney disease with stage 1 through stage 4 chronic kidney disease, or unspecified chronic kidney disease: Secondary | ICD-10-CM | POA: Diagnosis not present

## 2018-04-22 ENCOUNTER — Other Ambulatory Visit: Payer: Self-pay | Admitting: Internal Medicine

## 2018-04-24 ENCOUNTER — Telehealth: Payer: Self-pay | Admitting: Pharmacist Clinician (PhC)/ Clinical Pharmacy Specialist

## 2018-04-24 NOTE — Telephone Encounter (Signed)
Jacob [name] is coming to see [place] tomorrow for labs with his new therapy. He called to ask if glucosamine is ok with his current meds. Told him that it was fine.

## 2018-04-25 ENCOUNTER — Ambulatory Visit: Payer: Medicare Other

## 2018-04-25 ENCOUNTER — Ambulatory Visit (INDEPENDENT_AMBULATORY_CARE_PROVIDER_SITE_OTHER): Payer: Medicare Other | Admitting: Pharmacist Clinician (PhC)/ Clinical Pharmacy Specialist

## 2018-04-25 DIAGNOSIS — B20 Human immunodeficiency virus [HIV] disease: Secondary | ICD-10-CM | POA: Diagnosis not present

## 2018-04-25 NOTE — Progress Notes (Signed)
HPI: Jacob [name] is a 67 y.o. male who is here to see pharmacy for his HIV labs  Allergies: Allergies  Allergen Reactions  . Amphetamines Other (See Comments)    Pt is unaware of this -     Vitals:    Past Medical History: Past Medical History:  Diagnosis Date  . Arthritis    "all over; mostly knees and back" (02/28/2018)  . CHF (congestive heart failure) (HCC)    not on any meds  . Chronic lower back pain    stenosis  . CKD (chronic kidney disease), stage V ([place])    [name] 02/28/2018  . Coronary atherosclerosis of native coronary artery 2005   s/p surgery  . Drug abuse ([place])    hx; tested for cocaine as recently as 2/08. says he is not using drugs now - avoided defib. for this reason   . GERD (gastroesophageal reflux disease)    takes OTC meds as needed  . Glaucoma    uses eye drops daily  . Hepatitis B 1968   "tx'd w/isolation; caught it from toilet stools in gym"  . History of colon polyps    benign  . History of gout    takes Allopurinol daily as well as Colchicine-if needed (02/28/2018)  . History of kidney stones   . HTN (hypertension)    takes Coreg,Imdur.and Apresoline daily  . Human immunodeficiency virus (HIV) disease ([place]) dx'd 1995   takes Genvoya daily  . Hyperlipidemia    takes Atorvastatin daily  . Ischemic cardiomyopathy   . Muscle spasm    takes Zanaflex as needed  . Myocardial infarction ([place]) ~ 2004/2005  . Nocturia   . Peripheral neuropathy    takes gabapentin daily  . Pneumonia    "at least twice" (02/28/2018)  . Shortness of breath dyspnea    rarely but if notices it then with exertion  . Syphilis, unspecified   . Type II diabetes mellitus ([place]) 2004   Lantus daily.Average fasting blood sugar 125-199    Social History: Social History   Socioeconomic History  . Marital status: Single    Spouse name: Not on file  . Number of children: 1  . Years of education: Not on file  . Highest education level: Not on file  Occupational History   . Occupation: retired  . Occupation: part time- [employer]  . Financial resource strain: Not on file  . Food insecurity:    Worry: Not on file    Inability: Not on file  . Transportation needs:    Medical: Not on file    Non-medical: Not on file  Tobacco Use  . Smoking status: Current Every Day Smoker    Packs/day: 0.50    Years: 43.00    Pack years: 21.50    Types: Cigarettes  . Smokeless tobacco: Never Used  Substance and Sexual Activity  . Alcohol use: Yes    Comment: 02/28/2018 "maybe 4 beers/month"  . Drug use: No    Types: Cocaine    Comment: hx of crack/cocaine 49yrs ago  . Sexual activity: Not Currently  Lifestyle  . Physical activity:    Days per week: Not on file    Minutes per session: Not on file  . Stress: Not on file  Relationships  . Social connections:    Talks on phone: Not on file    Gets together: Not on file    Attends religious service: Not on file    Active member  of club or organization: Not on file    Attends meetings of clubs or organizations: Not on file    Relationship status: Not on file  Other Topics Concern  . Not on file  Social History Narrative   Retired.       As of 05/2015:   Diet: No salt    Caffeine   Married: Single    House: Condo, 2 stories, 1 person (self)    Pets: No    Current/Past profession: N/A   Exercise: walks daily    Living Will: Yes    DNR   POA/HPOA: No        Previous Regimen: [place]/combivir, Stribild  Current Regimen: Genvoya  Labs: HIV 1 RNA Quant (copies/mL)  Date Value  12/12/2017 <20 NOT DETECTED  06/27/2017 <20 DETECTED (A)  06/28/2016 <20   CD4 T Cell Abs (/uL)  Date Value  12/12/2017 860  06/27/2017 970  06/28/2016 1,330   Hep B S Ab (no units)  Date Value  12/18/2006 NO   Hepatitis B Surface Ag (no units)  Date Value  06/30/2011 NEGATIVE   HCV Ab (no units)  Date Value  06/30/2011 NEGATIVE    CrCl: CrCl cannot be calculated (Patient's most recent lab result  is older than the maximum 21 days allowed.).  Lipids:    Component Value Date/Time   CHOL 170 01/08/2018 0827   CHOL 121 08/07/2015 1041   TRIG 436 (H) 01/08/2018 0827   HDL 31 (L) 01/08/2018 0827   HDL 32 (L) 08/07/2015 1041   CHOLHDL 5.5 (H) 01/08/2018 0827   VLDL 48 (H) 03/15/2017 0924   LDLCALC  01/08/2018 0827     Comment:     . LDL cholesterol not calculated. Triglyceride levels greater than 400 mg/dL invalidate calculated LDL results. . Reference range: <100 . Desirable range <100 mg/dL for primary prevention;   <70 mg/dL for patients with CHD or diabetic patients  with > or = 2 CHD risk factors. [name] LDL-C is now calculated using the Martin-Hopkins  calculation, which is a validated novel method providing  better accuracy than the Friedewald equation in the  estimation of LDL-C.  Cresenciano Genre et al. Annamaria Helling. 2330;076(22): 2061-2068  (http://education.[employer].com/faq/FAQ164)     Assessment: Ray is doing very well on his Biktarvy and as usual, he doesn't miss any doses. His scr has continued to trend up since 2018. He will meet with his nephrologist soon to discuss the potential plan for HD. He is on a lot of BP medications. Fortunately, none will interact with his Biktarvy. We will get a HIV VL to confirm suppression today. He will come back for a visit with Dr. [name] in Sept.   He will meet with Sharyn Lull from [facility] today to renew his SPAP.    Recommendations:  Continue Biktarvy 1 PO qday HIV, bmet, hep A/B ab labs today F/u with Dr [name] in Sept  Bela Nyborg, PharmD, BCPS, [place], [place] for Infectious Disease 04/25/2018, 11:11 AM

## 2018-04-26 LAB — BASIC METABOLIC PANEL
BUN / CREAT RATIO: 15 (calc) (ref 6–22)
BUN: 79 mg/dL — ABNORMAL HIGH (ref 7–25)
CHLORIDE: 107 mmol/L (ref 98–110)
CO2: 19 mmol/L — ABNORMAL LOW (ref 20–32)
Calcium: 9 mg/dL (ref 8.6–10.3)
Creat: 5.14 mg/dL — ABNORMAL HIGH (ref 0.70–1.25)
Glucose, Bld: 112 mg/dL — ABNORMAL HIGH (ref 65–99)
POTASSIUM: 4.5 mmol/L (ref 3.5–5.3)
Sodium: 138 mmol/L (ref 135–146)

## 2018-04-26 LAB — HEPATITIS B SURFACE ANTIBODY,QUALITATIVE: Hep B S Ab: REACTIVE — AB

## 2018-04-26 LAB — HEPATITIS A ANTIBODY, TOTAL: Hepatitis A AB,Total: REACTIVE — AB

## 2018-04-30 ENCOUNTER — Telehealth: Payer: Self-pay | Admitting: Pharmacist Clinician (PhC)/ Clinical Pharmacy Specialist

## 2018-04-30 LAB — HIV-1 RNA QUANT-NO REFLEX-BLD
HIV 1 RNA QUANT: DETECTED {copies}/mL — AB
HIV-1 RNA QUANT, LOG: DETECTED {Log_copies}/mL — AB

## 2018-04-30 NOTE — Telephone Encounter (Signed)
Ray called to ask about his labs results from last week, especially, the potassium. Gave him the results including the HIV VL. His scr is continuing to trend up.

## 2018-05-08 ENCOUNTER — Ambulatory Visit: Payer: Medicare Other | Admitting: Internal Medicine

## 2018-05-17 ENCOUNTER — Encounter: Payer: Self-pay | Admitting: Internal Medicine

## 2018-05-18 ENCOUNTER — Telehealth: Payer: Self-pay

## 2018-05-18 NOTE — Telephone Encounter (Signed)
Per Dr.Reed patient needs to have potassium checked today, Kayelxalate is used to treat an abnormal potassium level and is not indicated for severe gas or bloating.   I called patient to inform him of Dr.Reed's response and patient states there is no way he can come in today to get potassium checked. Patient states maybe its not his potassium he was speculating.  Patient would like to know if Dr.Reed has other recommendations for gas and bloating  Please advise

## 2018-05-18 NOTE — Telephone Encounter (Signed)
Patient called due to his fasting blood sugars being over 200 for the last few mornings. He stated that he has increased his lantus to 74 units daily and his humalog to 15 units daily. After reviewing his medication list, patient should have increased lantus to 74 units and humalog to 16 units in June. Pt stated that he was unaware that he was supposed to increase his medications at that time.    Patient also states that he thinks that his potassium may be high again. He states that he normally has severe gas and bloating when the potassium goes up and would like to know if he needs to come in to have blood work done (he did state that he could not come until Monday).   He would like to know if he can get a prescription sent to the pharmacy for kayelxalate due to gas and bloating. He states that this is the only medication that help him once the GI symptoms start.    Please advise.

## 2018-05-18 NOTE — Telephone Encounter (Signed)
I would be curious if he's having regular bowel movements--constipation certainly causes gas and bloating.  We have to be careful with typical medications for gas because of his kidneys.  If he's constipated, he could take a one time dose of miralax over the counter which may help, but he should not use it again and again b/c it can affect his electrolytes.

## 2018-05-18 NOTE — Telephone Encounter (Signed)
Patient with regular bowel movements. Patient with large amounts of gas following bowel movements. Patient states he contacted his kidney specialist and has a pending appointment on Monday (05/21/18) at [place] to have potassium checked.

## 2018-05-21 DIAGNOSIS — N184 Chronic kidney disease, stage 4 (severe): Secondary | ICD-10-CM | POA: Diagnosis not present

## 2018-05-23 ENCOUNTER — Other Ambulatory Visit: Payer: Self-pay | Admitting: Internal Medicine

## 2018-05-30 ENCOUNTER — Encounter: Payer: Self-pay | Admitting: Internal Medicine

## 2018-05-30 ENCOUNTER — Ambulatory Visit (INDEPENDENT_AMBULATORY_CARE_PROVIDER_SITE_OTHER): Payer: Medicare Other | Admitting: Internal Medicine

## 2018-05-30 VITALS — BP 100/50 | HR 74 | Temp 98.3°F | Resp 10 | Ht 74.0 in | Wt 199.0 lb

## 2018-05-30 DIAGNOSIS — I10 Essential (primary) hypertension: Secondary | ICD-10-CM | POA: Diagnosis not present

## 2018-05-30 DIAGNOSIS — E1169 Type 2 diabetes mellitus with other specified complication: Secondary | ICD-10-CM

## 2018-05-30 DIAGNOSIS — Z720 Tobacco use: Secondary | ICD-10-CM | POA: Diagnosis not present

## 2018-05-30 DIAGNOSIS — M109 Gout, unspecified: Secondary | ICD-10-CM

## 2018-05-30 DIAGNOSIS — J449 Chronic obstructive pulmonary disease, unspecified: Secondary | ICD-10-CM

## 2018-05-30 DIAGNOSIS — E781 Pure hyperglyceridemia: Secondary | ICD-10-CM | POA: Diagnosis not present

## 2018-05-30 DIAGNOSIS — Z794 Long term (current) use of insulin: Secondary | ICD-10-CM | POA: Diagnosis not present

## 2018-05-30 DIAGNOSIS — B20 Human immunodeficiency virus [HIV] disease: Secondary | ICD-10-CM

## 2018-05-30 DIAGNOSIS — Z23 Encounter for immunization: Secondary | ICD-10-CM | POA: Diagnosis not present

## 2018-05-30 DIAGNOSIS — E1140 Type 2 diabetes mellitus with diabetic neuropathy, unspecified: Secondary | ICD-10-CM | POA: Diagnosis not present

## 2018-05-30 DIAGNOSIS — I5042 Chronic combined systolic (congestive) and diastolic (congestive) heart failure: Secondary | ICD-10-CM

## 2018-05-30 DIAGNOSIS — E1122 Type 2 diabetes mellitus with diabetic chronic kidney disease: Secondary | ICD-10-CM

## 2018-05-30 DIAGNOSIS — N184 Chronic kidney disease, stage 4 (severe): Secondary | ICD-10-CM

## 2018-05-30 DIAGNOSIS — E785 Hyperlipidemia, unspecified: Secondary | ICD-10-CM

## 2018-05-30 MED ORDER — UMECLIDINIUM-VILANTEROL 62.5-25 MCG/INH IN AEPB: INHALATION_SPRAY | RESPIRATORY_TRACT | 5 refills | Status: DC

## 2018-05-30 MED ORDER — ISOSORBIDE MONONITRATE ER 30 MG PO TB24: 30.0000 mg | ORAL_TABLET | Freq: Every day | ORAL | 1 refills | Status: DC

## 2018-05-30 MED ORDER — ATORVASTATIN CALCIUM 20 MG PO TABS: 20.0000 mg | ORAL_TABLET | Freq: Every day | ORAL | 1 refills | Status: DC

## 2018-05-30 MED ORDER — ALLOPURINOL 100 MG PO TABS: 100.0000 mg | ORAL_TABLET | Freq: Every day | ORAL | 1 refills | Status: DC

## 2018-05-30 MED ORDER — FENOFIBRIC ACID 135 MG PO CPDR: 1.0000 | DELAYED_RELEASE_CAPSULE | Freq: Every day | ORAL | 1 refills | Status: DC

## 2018-05-30 NOTE — Progress Notes (Signed)
Cardiology Office Note   Date:  05/31/2018   ID:  Jacob [name], DOB September 16, 1951, MRN [id number]  PCP:  Gildardo Cranker, DO  Cardiologist:  Dr. [name]    No chief complaint on file.     History of Present Illness:  67 y.o. with distant CABG in 2004 with EF improving from 24% to 45-50% after revascularization. Last myovue 2016 with scar no ischemia. Has done well with medical Rx and cath avoided due to CRF Now  Cr over 5 started on bicarb and needs to start dialysis. Comorbid conditions include HIV previous drug abuse HTN and HLD  No angina.   TEE done 02/28/18 reviewed EF stable 45-50%  Mild MR  Restrictive diastolic filling   He is not looking forward to dialysis at all Has not had uremic symptoms and says lasix is still working Dr Dewaine Oats will be his new primary . Dr [name] follows his kidneys and will see latter this week   Past Medical History:  Diagnosis Date  . Arthritis    "all over; mostly knees and back" (02/28/2018)  . CHF (congestive heart failure) (HCC)    not on any meds  . Chronic lower back pain    stenosis  . CKD (chronic kidney disease), stage V ([place])    [name] 02/28/2018  . Coronary atherosclerosis of native coronary artery 2005   s/p surgery  . Drug abuse ([place])    hx; tested for cocaine as recently as 2/08. says he is not using drugs now - avoided defib. for this reason   . GERD (gastroesophageal reflux disease)    takes OTC meds as needed  . Glaucoma    uses eye drops daily  . Hepatitis B 1968   "tx'd w/isolation; caught it from toilet stools in gym"  . History of colon polyps    benign  . History of gout    takes Allopurinol daily as well as Colchicine-if needed (02/28/2018)  . History of kidney stones   . HTN (hypertension)    takes Coreg,Imdur.and Apresoline daily  . Human immunodeficiency virus (HIV) disease ([place]) dx'd 1995   takes Genvoya daily  . Hyperlipidemia    takes Atorvastatin daily  . Ischemic cardiomyopathy   . Muscle spasm    takes Zanaflex as needed  . Myocardial infarction ([place]) ~ 2004/2005  . Nocturia   . Peripheral neuropathy    takes gabapentin daily  . Pneumonia    "at least twice" (02/28/2018)  . Shortness of breath dyspnea    rarely but if notices it then with exertion  . Syphilis, unspecified   . Type II diabetes mellitus ([place]) 2004   Lantus daily.Average fasting blood sugar 125-199    Past Surgical History:  Procedure Laterality Date  . BACK SURGERY    . CARDIAC CATHETERIZATION  10/2002; 12/19/2004   [name] 03/08/2011  . COLONOSCOPY  2013   Dr.John Henrene Pastor   . CORONARY ARTERY BYPASS GRAFT  02/24/2003   CABG X2/notes 03/08/2011  . FRACTURE SURGERY    . INTERTROCHANTERIC HIP FRACTURE SURGERY Left 11/2006   [name] 03/08/2011  . LAPAROSCOPIC CHOLECYSTECTOMY  05/2006  . LUMBAR LAMINECTOMY/DECOMPRESSION MICRODISCECTOMY N/A 02/29/2016   Procedure: Left L4-5 Lateral Recess Decompression, Removal Extradural Intraspinal Facet Cyst;  Surgeon: Marybelle Killings, MD;  Location: [facility];  Service: Orthopedics;  Laterality: N/A;  . ORIF MANDIBULAR FRACTURE Left 08/13/2004   ORIF of left body fracture mandible with KLS Martin 2.3-mm six hole/notes 03/08/2011     Current Outpatient  Medications  Medication Sig Dispense Refill  . allopurinol (ZYLOPRIM) 100 MG tablet Take 1 tablet (100 mg total) by mouth daily. 90 tablet 1  . aspirin EC 81 MG tablet Take 81 mg by mouth daily.    [name] atorvastatin (LIPITOR) 20 MG tablet Take 1 tablet (20 mg total) by mouth daily. 90 tablet 1  . B-D UF III MINI PEN NEEDLES 31G X 5 MM MISC use four times a day 100 each 11  . bictegravir-emtricitabine-tenofovir AF (BIKTARVY) 50-200-25 MG TABS tablet Take 1 tablet by mouth daily. 30 tablet 11  . Blood Glucose Monitoring Suppl (ONE TOUCH ULTRA MINI) w/Device KIT 1 each by Does not apply route 2 (two) times daily. Dx: E11.40 1 each 0  . carvedilol (COREG) 25 MG tablet TAKE 1 TABLET BY MOUTH TWICE A DAY WITH MEALS 180 tablet 1  . Choline Fenofibrate  (FENOFIBRIC ACID) 135 MG CPDR Take 1 capsule by mouth daily. 90 capsule 1  . colchicine 0.6 MG tablet Take one tablet by mouth as needed for gout 30 tablet 2  . furosemide (LASIX) 80 MG tablet Take 1.5 tablets (120 mg total) by mouth 2 (two) times daily. 120 tablet 0  . gabapentin (NEURONTIN) 300 MG capsule TAKE 1 CAPSULE BY MOUTH THREE TIMES A DAY 90 capsule 5  . gentamicin cream (GARAMYCIN) 0.1 % Apply 1 application topically 3 (three) times daily. 30 g 1  . Glucosamine-Chondroit-Vit C-Mn (GLUCOSAMINE CHONDR 1500 COMPLX PO) Take 2 tablets by mouth daily. Reported on 04/04/2016    . glucose blood test strip Use to test blood sugar twice daily. E11.40 200 each 3  . hydrALAZINE (APRESOLINE) 25 MG tablet TAKE 1 TABLET BY MOUTH THREE TIMES DAILY 270 tablet 1  . insulin lispro (HUMALOG KWIKPEN) 100 UNIT/ML KiwkPen Inject 16 units subcutaneously three times daily before meals 33 mL 3  . isosorbide mononitrate (IMDUR) 30 MG 24 hr tablet Take 1 tablet (30 mg total) by mouth daily. 90 tablet 1  . LANTUS SOLOSTAR 100 UNIT/ML Solostar Pen Inject 74 units subcutaneously every evening 15 mL 6  . latanoprost (XALATAN) 0.005 % ophthalmic solution Place 1 drop into both eyes at bedtime. Reported on 04/04/2016    . nitroGLYCERIN (NITROSTAT) 0.3 MG SL tablet Place 1 tablet (0.3 mg total) under the tongue as needed for chest pain (not to exceed 3 tablets in one day.). 75 tablet 0  . sodium bicarbonate 650 MG tablet 1 by mouth two times daily rx'ed by Dr.Upton  3  . umeclidinium-vilanterol (ANORO ELLIPTA) 62.5-25 MCG/INH AEPB INHALE 1 PUFF BY MOUTH EVERY DAY 60 each 5   No current facility-administered medications for this visit.     Allergies:   Amphetamines    Social History:  The patient  reports that he has been smoking cigarettes. He has a 21.50 pack-year smoking history. He has never used smokeless tobacco. He reports that he drinks alcohol. He reports that he does not use drugs.   Family History:  The  patient's family history includes Alzheimer's disease in his mother and sister; Diabetes in his brother, brother, and sister; Drug abuse in his brother; Heart attack in his brother; Heart failure in his father; Hypertension in his brother and father; Stroke in his sister.    ROS:  General:no colds or fevers,  weight down 3 lbs by our scales but at home back to baseline Skin:no rashes or ulcers HEENT:no blurred vision, no congestion CV:see HPI PUL:see HPI GI:no diarrhea constipation or melena, no indigestion  GU:no hematuria, no dysuria MS:no joint pain, no claudication Neuro:no syncope, no lightheadedness Endo:+ diabetes glucose stable, no thyroid disease  Wt Readings from Last 3 Encounters:  05/31/18 201 lb 8 oz (91.4 kg)  05/30/18 199 lb (90.3 kg)  03/13/18 199 lb 9.6 oz (90.5 kg)     PHYSICAL EXAM: VS:  BP 124/76   Pulse 72   Ht _0  (1.88 m)   Wt 201 lb 8 oz (91.4 kg)   SpO2 97%   BMI 25.87 kg/m  , BMI Body mass index is 25.87 kg/m. Affect appropriate Healthy:  appears stated age 58: normal Neck supple with no adenopathy JVP normal no bruits no thyromegaly Lungs clear with no wheezing and good diaphragmatic motion Heart:  S1/S2 no murmur, no rub, gallop or click PMI normal Abdomen: benighn, BS positve, no tenderness, no AAA no bruit.  No HSM or HJR Distal pulses intact with no bruits No edema Neuro non-focal Skin warm and dry No muscular weakness     EKG:   10/21/17 SR rate 77 PVC possible old IMI lateral T wave inversion    Recent Labs: 07/18/2017: TSH 0.81 02/28/2018: ALT 25; B Natriuretic Peptide 691.4 03/13/2018: Hemoglobin 12.8; Platelets 222 04/25/2018: BUN 79; Creat 5.14; Potassium 4.5; Sodium 138    Lipid Panel    Component Value Date/Time   CHOL 178 05/30/2018 1007   CHOL 121 08/07/2015 1041   TRIG 245 (H) 05/30/2018 1007   HDL 29 (L) 05/30/2018 1007   HDL 32 (L) 08/07/2015 1041   CHOLHDL 6.1 (H) 05/30/2018 1007   VLDL 48 (H) 03/15/2017  0924   LDLCALC 112 (H) 05/30/2018 1007       Other studies Reviewed: Additional studies/ records that were reviewed today include: recent OV note.  ECHO 11/10/2016 ------------------------------------------------------------------- Study Conclusions  - Left ventricle: Septal and inferior wall hypokinesis The cavity   size was mildly dilated. There was moderate concentric   hypertrophy. Systolic function was mildly reduced. The estimated   ejection fraction was in the range of 45% to 50%. Doppler   parameters are consistent with both elevated ventricular   end-diastolic filling pressure and elevated left atrial filling   pressure. - Left atrium: The atrium was mildly dilated. - Atrial septum: No defect or patent foramen ovale was identified.  ASSESSMENT AND PLAN:  1.  Acute on chronic systolic and diastolic HF this is related to CRF will need to start dialysis to maintain Volume and PH  2. CAD no angina, stable.myovue 2016 not ischemic Distant CABG 2004. Cath not pursued as EF has improved last myovue not ischemic and has CRF   3. DM:  Discussed low carb diet.  Target hemoglobin A1c is 6.5 or less.  Continue current medications.  4. CRF:  Needs dialysis Cr over 5 started on bicarb and K 4.5 on 04/25/18   5. Cholesterol:  On statin labs with primary   6. HTN:  Well controlled.  Continue current medications and low sodium Dash type diet.       Jenkins Rouge, MD

## 2018-05-30 NOTE — Patient Instructions (Addendum)
PNEUMOVAX VACCINE GIVEN TODAY  Continue current medications as ordered  Follow up with specialists as scheduled  Will call with lab results  Follow up in 3 mos with [name] for DM, HTN, CKD, COPD

## 2018-05-30 NOTE — Progress Notes (Signed)
[name], male   DOB: [birth year], 67 y.o.   MRN: [id number]   Location:  [facility] OFFICE  Provider: DR Arletha Grippe  Code Status:  Goals of Care:  Advanced Directives 03/13/2018  Does Patient Have a Medical Advance Directive? Yes  Type of Paramedic of [place];Living will  Does patient want to make changes to medical advance directive? -  Copy of [place] in Chart? Yes  Would patient like information on creating a medical advance directive? -     Chief Complaint  Patient presents with  . Medical Management of Chronic Issues    4 month follow-up   . Medication Refill    90 day supply to [facility], lipitor, allopurinol, fenofibric, imdur, and anoro ellipta   . Health Maintenance    Discuss need for pneu vaccine    HPI: Patient is a 67 y.o. male seen today for medical management of chronic diseases.  He was started on NaHCO3 last week by nephrology Dr [name]. K was elevated. He will f/u later this week to discuss starting dialysis. He is due to see cardio Dr [name] tomorrow.   He is UTD on most vaccines - last pneumovax in 2012 (prior to age 89)  Tob abuse - ongoing smoking but down to < 1/2 ppd.    HIV - stable on HAART tx (genvoya). HIV1 RNA quant <20; log <1.3. CD4 abs 860. Treponemal Abs reactive RPR titer 1:1. Followed by ID Dr [name] annually  HTN/CHF - stable on hydralazine, coreg, imdur. Followed by Dr [name]   DM - controlled. He takes 1/2 dose lantus on each side of abdomen.  No low BS reactions. Numbness/tingling in feet occasionally. Takes Lantus  insulin 74 units qhs and humalog 16 units qAC. Followed by podiatry. No ulcerations but has calluses. A1c 6.5%. He is not on ARB due to AKI and hyperkalemia. He has an eye specialist. He has diabetic preulcerative calluses and bunions and is followed by podiatry. Urine microalbumin/Cr ratio 1937 (prev 1142.6).  Hyperlipidemia - uncontrolled. Takes lipitor,  fenofibrate. LDL 104 (few mos ago); TG 436; HDL 31  CKD  - stage 4. Cr 5.14; CO2 19. Followed by nephrology Dr [name]. Takes NaHco3 tabs   Past Medical History:  Diagnosis Date  . Arthritis    "all over; mostly knees and back" (02/28/2018)  . CHF (congestive heart failure) (HCC)    not on any meds  . Chronic lower back pain    stenosis  . CKD (chronic kidney disease), stage V ([place])    [name] 02/28/2018  . Coronary atherosclerosis of native coronary artery 2005   s/p surgery  . Drug abuse ([place])    hx; tested for cocaine as recently as 2/08. says he is not using drugs now - avoided defib. for this reason   . GERD (gastroesophageal reflux disease)    takes OTC meds as needed  . Glaucoma    uses eye drops daily  . Hepatitis B 1968   "tx'd w/isolation; caught it from toilet stools in gym"  . History of colon polyps    benign  . History of gout    takes Allopurinol daily as well as Colchicine-if needed (02/28/2018)  . History of kidney stones   . HTN (hypertension)    takes Coreg,Imdur.and Apresoline daily  . Human immunodeficiency virus (HIV) disease ([place]) dx'd 1995   takes Genvoya daily  . Hyperlipidemia    takes Atorvastatin daily  . Ischemic  cardiomyopathy   . Muscle spasm    takes Zanaflex as needed  . Myocardial infarction ([place]) ~ 2004/2005  . Nocturia   . Peripheral neuropathy    takes gabapentin daily  . Pneumonia    "at least twice" (02/28/2018)  . Shortness of breath dyspnea    rarely but if notices it then with exertion  . Syphilis, unspecified   . Type II diabetes mellitus ([place]) 2004   Lantus daily.Average fasting blood sugar 125-199    Past Surgical History:  Procedure Laterality Date  . BACK SURGERY    . CARDIAC CATHETERIZATION  10/2002; 12/19/2004   [name] 03/08/2011  . COLONOSCOPY  2013   Dr.John Henrene Pastor   . CORONARY ARTERY BYPASS GRAFT  02/24/2003   CABG X2/notes 03/08/2011  . FRACTURE SURGERY    . INTERTROCHANTERIC HIP FRACTURE SURGERY Left 11/2006    [name] 03/08/2011  . LAPAROSCOPIC CHOLECYSTECTOMY  05/2006  . LUMBAR LAMINECTOMY/DECOMPRESSION MICRODISCECTOMY N/A 02/29/2016   Procedure: Left L4-5 Lateral Recess Decompression, Removal Extradural Intraspinal Facet Cyst;  Surgeon: Marybelle Killings, MD;  Location: [facility];  Service: Orthopedics;  Laterality: N/A;  . ORIF MANDIBULAR FRACTURE Left 08/13/2004   ORIF of left body fracture mandible with KLS Martin 2.3-mm six hole/notes 03/08/2011     reports that he has been smoking cigarettes.  He has a 21.50 pack-year smoking history. He has never used smokeless tobacco. He reports that he drinks alcohol. He reports that he does not use drugs. Social History   Socioeconomic History  . Marital status: Single    Spouse name: Not on file  . Number of children: 1  . Years of education: Not on file  . Highest education level: Not on file  Occupational History  . Occupation: retired  . Occupation: part time- [employer]  . Financial resource strain: Not on file  . Food insecurity:    Worry: Not on file    Inability: Not on file  . Transportation needs:    Medical: Not on file    Non-medical: Not on file  Tobacco Use  . Smoking status: Current Every Day Smoker    Packs/day: 0.50    Years: 43.00    Pack years: 21.50    Types: Cigarettes  . Smokeless tobacco: Never Used  Substance and Sexual Activity  . Alcohol use: Yes    Comment: 02/28/2018 "maybe 2 beers/month"  . Drug use: No    Types: Cocaine    Comment: hx of crack/cocaine 33yr ago  . Sexual activity: Not Currently  Lifestyle  . Physical activity:    Days per week: Not on file    Minutes per session: Not on file  . Stress: Not on file  Relationships  . Social connections:    Talks on phone: Not on file    Gets together: Not on file    Attends religious service: Not on file    Active member of club or organization: Not on file    Attends meetings of clubs or organizations: Not on file    Relationship status: Not on file    . Intimate partner violence:    Fear of current or ex partner: Not on file    Emotionally abused: Not on file    Physically abused: Not on file    Forced sexual activity: Not on file  Other Topics Concern  . Not on file  Social History Narrative   Retired.       As of 05/2015:  Diet: No salt    Caffeine   Married: Single    House: Condo, 2 stories, 1 person (self)    Pets: No    Current/Past profession: N/A   Exercise: walks daily    Living Will: Yes    DNR   POA/HPOA: No        Family History  Problem Relation Age of Onset  . Heart failure Father   . Hypertension Father   . Diabetes Brother   . Heart attack Brother   . Alzheimer's disease Mother   . Stroke Sister   . Diabetes Sister   . Alzheimer's disease Sister   . Hypertension Brother   . Diabetes Brother   . Drug abuse Brother   . Colon cancer Neg Hx     Allergies  Allergen Reactions  . Amphetamines Other (See Comments)    Pt is unaware of this -     Outpatient Encounter Medications as of 05/30/2018  Medication Sig  . allopurinol (ZYLOPRIM) 100 MG tablet TAKE 1 TABLET BY MOUTH ONCE DAILY  . ANORO ELLIPTA 62.5-25 MCG/INH AEPB INHALE 1 PUFF BY MOUTH EVERY DAY  . aspirin EC 81 MG tablet Take 81 mg by mouth daily.  [name] atorvastatin (LIPITOR) 20 MG tablet TAKE 1 TABLET BY MOUTH EVERY DAY  . B-D UF III MINI PEN NEEDLES 31G X 5 MM MISC use four times a day  . bictegravir-emtricitabine-tenofovir AF (BIKTARVY) 50-200-25 MG TABS tablet Take 1 tablet by mouth daily.  . Blood Glucose Monitoring Suppl (ONE TOUCH ULTRA MINI) w/Device KIT 1 each by Does not apply route 2 (two) times daily. Dx: E11.40  . carvedilol (COREG) 25 MG tablet TAKE 1 TABLET BY MOUTH TWICE A DAY WITH MEALS  . Choline Fenofibrate (FENOFIBRIC ACID) 135 MG CPDR take 1 capsule by mouth once daily  . colchicine 0.6 MG tablet Take one tablet by mouth as needed for gout  . furosemide (LASIX) 80 MG tablet Take 1.5 tablets (120 mg total) by mouth 2 (two)  times daily.  [name] gabapentin (NEURONTIN) 300 MG capsule TAKE 1 CAPSULE BY MOUTH THREE TIMES A DAY  . gentamicin cream (GARAMYCIN) 0.1 % Apply 1 application topically 3 (three) times daily.  . Glucosamine-Chondroit-Vit C-Mn (GLUCOSAMINE CHONDR 1500 COMPLX PO) Take 2 tablets by mouth daily. Reported on 04/04/2016  . glucose blood test strip Use to test blood sugar twice daily. E11.40  . hydrALAZINE (APRESOLINE) 25 MG tablet TAKE 1 TABLET BY MOUTH THREE TIMES DAILY  . insulin lispro (HUMALOG KWIKPEN) 100 UNIT/ML KiwkPen Inject 16 units subcutaneously three times daily before meals  . isosorbide mononitrate (IMDUR) 30 MG 24 hr tablet take 1 tablet by mouth once daily  . LANTUS SOLOSTAR 100 UNIT/ML Solostar Pen Inject 74 units subcutaneously every evening  . latanoprost (XALATAN) 0.005 % ophthalmic solution Place 1 drop into both eyes at bedtime. Reported on 04/04/2016  . nitroGLYCERIN (NITROSTAT) 0.3 MG SL tablet Place 1 tablet (0.3 mg total) under the tongue as needed for chest pain (not to exceed 3 tablets in one day.).  [name] sodium bicarbonate 650 MG tablet 1 by mouth two times daily rx'ed by Dr.Upton   No facility-administered encounter medications on file as of 05/30/2018.     Review of Systems:  Review of Systems  Cardiovascular: Positive for leg swelling.  Musculoskeletal: Positive for arthralgias.  Neurological: Positive for numbness.  All other systems reviewed and are negative.   Health Maintenance  Topic Date Due  . PNA vac Low  Risk Adult (2 of 2 - PPSV23) 08/18/2017  . INFLUENZA VACCINE  05/24/2018  . HEMOGLOBIN A1C  06/01/2018  . OPHTHALMOLOGY EXAM  06/30/2018  . FOOT EXAM  10/11/2018  . LIPID PANEL  01/09/2019  . TETANUS/TDAP  07/27/2021  . COLONOSCOPY  10/01/2022  . Hepatitis C Screening  Completed    Physical Exam: Vitals:   05/30/18 0901  BP: (!) 100/50  Pulse: 74  Resp: 10  Temp: 98.3 F (36.8 C)  TempSrc: Oral  SpO2: 97%  Weight: 199 lb (90.3 kg)  Height: '6\' 2"'   (1.88 m)   Body mass index is 25.55 kg/m. Physical Exam  Constitutional: He is oriented to person, place, and time. He appears well-developed and well-nourished.  HENT:  Mouth/Throat: Oropharynx is clear and moist.  MMM; no oral thrush  Eyes: Pupils are equal, round, and reactive to light. No scleral icterus.  Neck: Neck supple. Carotid bruit is not present. No thyromegaly present.  Cardiovascular: Normal rate, regular rhythm and intact distal pulses. Exam reveals no gallop and no friction rub.  Murmur heard. Trace LE edema b/l. No calf TTP  Pulmonary/Chest: Effort normal and breath sounds normal. He has no wheezes. He has no rales. He exhibits no tenderness.  Abdominal: Soft. Bowel sounds are normal. He exhibits distension. He exhibits no abdominal bruit, no pulsatile midline mass and no mass. There is no hepatomegaly. There is no tenderness. There is no rebound and no guarding.  Musculoskeletal: He exhibits edema.  Lymphadenopathy:    He has no cervical adenopathy.  Neurological: He is alert and oriented to person, place, and time. He has normal reflexes.  Skin: Skin is warm and dry. No rash noted.  Psychiatric: He has a normal mood and affect. His behavior is normal. Judgment and thought content normal.    Labs reviewed: Basic Metabolic Panel: Recent Labs    07/18/17 0828  02/28/18 1640 03/01/18 0300 03/05/18 1443 03/13/18 1034 04/25/18 1227  NA 139   < > 137 137 138 138 138  K 5.5*   < > 6.8* 5.4* 5.1 5.1 4.5  CL 111*   < > 106 106 107 112* 107  CO2 24   < > '22 23 21 ' 17* 19*  GLUCOSE 73   < > 292* 234* 89 165* 112*  BUN 44*   < > 60* 66* 78* 78* 79*  CREATININE 2.79*   < > 4.70* 4.22* 4.25* 4.11* 5.14*  CALCIUM 9.5   < > 9.4 9.0 8.9 9.3 9.0  PHOS  --   --  3.5 3.7  --   --   --   TSH 0.81  --   --   --   --   --   --    < > = values in this interval not displayed.   Liver Function Tests: Recent Labs    06/27/17 1028  01/08/18 0827 02/27/18 1600 02/28/18 0546  02/28/18 1640 03/01/18 0300  AST 24   < > 25 24 37  --   --   ALT 19   < > '19 16 25  ' --   --   ALKPHOS 54  --   --   --  36*  --   --   BILITOT 0.3   < > 0.4 0.3 1.0  --   --   PROT 7.4   < > 8.3* 7.7 7.4  --   --   ALBUMIN 3.6  --   --   --  3.4* 3.5 3.2*   < > = values in this interval not displayed.   No results for input(s): LIPASE, AMYLASE in the last 8760 hours. No results for input(s): AMMONIA in the last 8760 hours. CBC: Recent Labs    02/27/18 1600 02/28/18 0546 03/01/18 0300 03/13/18 1034  WBC 5.7 10.5 10.1 6.2  NEUTROABS 2,491  --   --  3,150  HGB 12.8* 12.6* 11.7* 12.8*  HCT 36.8* 38.4* 35.2* 36.9*  MCV 95.8 99.7 97.8 95.1  PLT 186 167 173 222   Lipid Panel: Recent Labs    07/18/17 0828 10/09/17 0821 01/08/18 0827  CHOL 177 211* 170  HDL 34* 39* 31*  LDLCALC 104* 130*  --   TRIG 295* 297* 436*  CHOLHDL 5.2* 5.4* 5.5*   Lab Results  Component Value Date   HGBA1C 6.5 (H) 03/01/2018    Procedures since last visit: No results found.  Assessment/Plan   ICD-10-CM   1. Type 2 diabetes mellitus with diabetic neuropathy, with long-term current use of insulin (HCC) E11.40 Hemoglobin A1c   Z79.4   2. Hypertriglyceridemia E78.1 atorvastatin (LIPITOR) 20 MG tablet    Choline Fenofibrate (FENOFIBRIC ACID) 135 MG CPDR    Lipid Panel  3. Tobacco abuse Z72.0   4. Essential hypertension I10   5. Human immunodeficiency virus (HIV) disease ([place]) B20   6. CKD stage 4 due to type 2 diabetes mellitus (HCC) E11.22    N18.4   7. Gout, unspecified cause, unspecified chronicity, unspecified site M10.9 allopurinol (ZYLOPRIM) 100 MG tablet  8. Chronic obstructive pulmonary disease, unspecified COPD type ([place]) J44.9 umeclidinium-vilanterol (ANORO ELLIPTA) 62.5-25 MCG/INH AEPB  9. Chronic combined systolic and diastolic congestive heart failure (HCC) I50.42 isosorbide mononitrate (IMDUR) 30 MG 24 hr tablet  10. Need for pneumococcal vaccination Z23 Pneumococcal  polysaccharide vaccine 23-valent greater than or equal to 2yo subcutaneous/IM    PNEUMOVAX VACCINE GIVEN TODAY  Continue current medications as ordered  Follow up with specialists as scheduled  Will call with lab results  Follow up in 3 mos with [name] for DM, HTN, CKD, COPD    Asyria Kolander S. Perlie Gold  [facility] [address] [place], [place] [address] [phone] Cell (Monday-Friday 8 AM - 5 PM) [phone] After 5 PM and follow prompts

## 2018-05-31 ENCOUNTER — Telehealth: Payer: Self-pay | Admitting: *Deleted

## 2018-05-31 ENCOUNTER — Telehealth: Payer: Self-pay

## 2018-05-31 ENCOUNTER — Encounter: Payer: Self-pay | Admitting: Cardiovascular Disease

## 2018-05-31 ENCOUNTER — Encounter: Payer: Self-pay | Admitting: Internal Medicine

## 2018-05-31 ENCOUNTER — Ambulatory Visit (INDEPENDENT_AMBULATORY_CARE_PROVIDER_SITE_OTHER): Payer: Medicare Other | Admitting: Cardiovascular Disease

## 2018-05-31 VITALS — BP 124/76 | HR 72 | Ht 74.0 in | Wt 201.5 lb

## 2018-05-31 DIAGNOSIS — I5043 Acute on chronic combined systolic (congestive) and diastolic (congestive) heart failure: Secondary | ICD-10-CM

## 2018-05-31 DIAGNOSIS — Z951 Presence of aortocoronary bypass graft: Secondary | ICD-10-CM

## 2018-05-31 LAB — HEMOGLOBIN A1C
EAG (MMOL/L): 9.5 (calc)
HEMOGLOBIN A1C: 7.6 %{Hb} — AB (ref <5.7)
MEAN PLASMA GLUCOSE: 171 (calc)

## 2018-05-31 LAB — LIPID PANEL
Cholesterol: 178 mg/dL (ref <200)
HDL: 29 mg/dL — ABNORMAL LOW (ref >40)
LDL Cholesterol (Calc): 112 mg/dL (calc) — ABNORMAL HIGH
NON-HDL CHOLESTEROL (CALC): 149 mg/dL — AB (ref <130)
TRIGLYCERIDES: 245 mg/dL — AB (ref <150)
Total CHOL/HDL Ratio: 6.1 (calc) — ABNORMAL HIGH (ref <5.0)

## 2018-05-31 MED ORDER — INSULIN LISPRO 100 UNIT/ML (KWIKPEN): PEN_INJECTOR | SUBCUTANEOUS | 3 refills | Status: DC

## 2018-05-31 NOTE — Telephone Encounter (Signed)
Discussed results with patient, patient verbalized understanding of results   RX sent to pharmacy with increased dose of Humalog

## 2018-05-31 NOTE — Telephone Encounter (Signed)
-----   Message from [place], [place] sent at 05/31/2018  3:23 PM EDT ----- Blood sugar borderline uncontrolled - increase humalog to 18 units qAC; cholesterol not at goal - watch fatty foods; follow up as scheduled and continue other medications

## 2018-05-31 NOTE — Telephone Encounter (Signed)
Patient called and stated that he received his pneumonia injection yesterday from our office and on the way back walking to the bus stop his knee gave out and hurt. Now the pain is down in his feet. Patient is wondering if it is coming from the pneumonia injection. Stated that there is no redness or reaction at the site of the injection. Please Advise.

## 2018-05-31 NOTE — Patient Instructions (Signed)
Medication Instructions:  Your physician recommends that you continue on your current medications as directed. Please refer to the Current Medication list given to you today.  Labwork: NONE  Testing/Procedures: NONE  Follow-Up: Your physician wants you to follow-up in: 6 months with Dr. Nishan. You will receive a reminder letter in the mail two months in advance. If you don't receive a letter, please call our office to schedule the follow-up appointment.   If you need a refill on your cardiac medications before your next appointment, please call your pharmacy.    

## 2018-06-01 DIAGNOSIS — N184 Chronic kidney disease, stage 4 (severe): Secondary | ICD-10-CM | POA: Diagnosis not present

## 2018-06-01 DIAGNOSIS — I120 Hypertensive chronic kidney disease with stage 5 chronic kidney disease or end stage renal disease: Secondary | ICD-10-CM | POA: Diagnosis not present

## 2018-06-01 DIAGNOSIS — N189 Chronic kidney disease, unspecified: Secondary | ICD-10-CM | POA: Diagnosis not present

## 2018-06-01 DIAGNOSIS — I5042 Chronic combined systolic (congestive) and diastolic (congestive) heart failure: Secondary | ICD-10-CM | POA: Diagnosis not present

## 2018-06-01 DIAGNOSIS — D631 Anemia in chronic kidney disease: Secondary | ICD-10-CM | POA: Diagnosis not present

## 2018-06-01 DIAGNOSIS — N185 Chronic kidney disease, stage 5: Secondary | ICD-10-CM | POA: Diagnosis not present

## 2018-06-01 DIAGNOSIS — N2581 Secondary hyperparathyroidism of renal origin: Secondary | ICD-10-CM | POA: Diagnosis not present

## 2018-06-01 NOTE — Telephone Encounter (Signed)
Pain in knee not related to pneumonia vaccine

## 2018-06-03 ENCOUNTER — Other Ambulatory Visit: Payer: Self-pay | Admitting: Internal Medicine

## 2018-06-03 DIAGNOSIS — E785 Hyperlipidemia, unspecified: Principal | ICD-10-CM

## 2018-06-03 DIAGNOSIS — E1169 Type 2 diabetes mellitus with other specified complication: Secondary | ICD-10-CM

## 2018-06-04 ENCOUNTER — Other Ambulatory Visit: Payer: Self-pay | Admitting: Podiatry

## 2018-06-04 NOTE — Telephone Encounter (Signed)
Patient notified and agreed.  

## 2018-06-06 ENCOUNTER — Other Ambulatory Visit: Payer: Self-pay

## 2018-06-06 DIAGNOSIS — N184 Chronic kidney disease, stage 4 (severe): Secondary | ICD-10-CM

## 2018-06-13 ENCOUNTER — Encounter: Payer: Self-pay | Admitting: Internal Medicine

## 2018-06-15 ENCOUNTER — Other Ambulatory Visit: Payer: Self-pay | Admitting: *Deleted

## 2018-06-15 MED ORDER — GLUCOSE BLOOD VI STRP: ORAL_STRIP | 3 refills | Status: DC

## 2018-06-15 NOTE — Telephone Encounter (Signed)
Patient requested refill. Insulin change has been checking blood sugar a little more that twice daily due to blood sugars running high.

## 2018-06-18 ENCOUNTER — Other Ambulatory Visit: Payer: Medicare Other

## 2018-06-18 DIAGNOSIS — B20 Human immunodeficiency virus [HIV] disease: Secondary | ICD-10-CM

## 2018-06-19 LAB — T-HELPER CELL (CD4) - (RCID CLINIC ONLY)
CD4 T CELL ABS: 1220 /uL (ref 400–2700)
CD4 T CELL HELPER: 39 % (ref 33–55)

## 2018-06-20 LAB — HIV-1 RNA QUANT-NO REFLEX-BLD
HIV 1 RNA Quant: <20 copies/mL — AB
HIV-1 RNA Quant, Log: <1.3 Log copies/mL — AB

## 2018-06-21 ENCOUNTER — Other Ambulatory Visit: Payer: Self-pay | Admitting: Internal Medicine

## 2018-06-26 ENCOUNTER — Ambulatory Visit (INDEPENDENT_AMBULATORY_CARE_PROVIDER_SITE_OTHER): Payer: Medicare Other

## 2018-06-26 VITALS — BP 138/68 | HR 80 | Temp 97.9°F | Ht 74.0 in | Wt 201.0 lb

## 2018-06-26 DIAGNOSIS — Z0000 Encounter for general adult medical examination without abnormal findings: Secondary | ICD-10-CM

## 2018-06-26 NOTE — Progress Notes (Signed)
Subjective:   Jacob [name] is a 67 y.o. male who presents for Medicare Annual/Subsequent preventive examination.  Last AWV-07/21/2017    Objective:    Vitals: BP 138/68 (BP Location: Left Arm, Patient Position: Sitting)   Pulse 80   Temp 97.9 F (36.6 C) (Oral)   Ht '6\' 2"'  (1.88 m)   Wt 201 lb (91.2 kg)   SpO2 96%   BMI 25.81 kg/m   Body mass index is 25.81 kg/m.  Advanced Directives 06/26/2018 03/13/2018 02/28/2018 01/26/2018 08/09/2017 07/21/2017 04/14/2017  Does Patient Have a Medical Advance Directive? Yes Yes Yes No;Yes Yes Yes Yes  Type of Paramedic of [place];Living will [place];Living will Healthcare Power of Attorney Living will Living will Living will;Healthcare Power of [place]  Does patient want to make changes to medical advance directive? No - Patient declined - No - Patient declined - - No - Patient declined -  Copy of [place] in Chart? Yes Yes No - copy requested - - Yes Yes  Would patient like information on creating a medical advance directive? - - - - - - -    Tobacco Social History   Tobacco Use  Smoking Status Current Every Day Smoker  . Packs/day: 0.50  . Years: 43.00  . Pack years: 21.50  . Types: Cigarettes  Smokeless Tobacco Never Used     Ready to quit: Not Answered Counseling given: Not Answered   Clinical Intake:  Pre-visit preparation completed: No  Pain : No/denies pain     Nutritional Risks: None Diabetes: Yes CBG done?: No Did pt. bring in CBG monitor from home?: No  How often do you need to have someone help you when you read instructions, pamphlets, or other written materials from your doctor or pharmacy?: 1 - Never What is the last grade level you completed in school?: 2 years of college  Interpreter Needed?: No  Information entered by :: Tyson Dense, RN  Past Medical History:  Diagnosis Date  . Arthritis    "all over;  mostly knees and back" (02/28/2018)  . CHF (congestive heart failure) (HCC)    not on any meds  . Chronic lower back pain    stenosis  . CKD (chronic kidney disease), stage V ([place])    [name] 02/28/2018  . Coronary atherosclerosis of native coronary artery 2005   s/p surgery  . Drug abuse ([place])    hx; tested for cocaine as recently as 2/08. says he is not using drugs now - avoided defib. for this reason   . GERD (gastroesophageal reflux disease)    takes OTC meds as needed  . Glaucoma    uses eye drops daily  . Hepatitis B 1968   "tx'd w/isolation; caught it from toilet stools in gym"  . History of colon polyps    benign  . History of gout    takes Allopurinol daily as well as Colchicine-if needed (02/28/2018)  . History of kidney stones   . HTN (hypertension)    takes Coreg,Imdur.and Apresoline daily  . Human immunodeficiency virus (HIV) disease ([place]) dx'd 1995   takes Genvoya daily  . Hyperlipidemia    takes Atorvastatin daily  . Ischemic cardiomyopathy   . Muscle spasm    takes Zanaflex as needed  . Myocardial infarction ([place]) ~ 2004/2005  . Nocturia   . Peripheral neuropathy    takes gabapentin daily  . Pneumonia    "at least  twice" (02/28/2018)  . Shortness of breath dyspnea    rarely but if notices it then with exertion  . Syphilis, unspecified   . Type II diabetes mellitus ([place]) 2004   Lantus daily.Average fasting blood sugar 125-199   Past Surgical History:  Procedure Laterality Date  . BACK SURGERY    . CARDIAC CATHETERIZATION  10/2002; 12/19/2004   [name] 03/08/2011  . COLONOSCOPY  2013   Dr.John Henrene Pastor   . CORONARY ARTERY BYPASS GRAFT  02/24/2003   CABG X2/notes 03/08/2011  . FRACTURE SURGERY    . INTERTROCHANTERIC HIP FRACTURE SURGERY Left 11/2006   [name] 03/08/2011  . LAPAROSCOPIC CHOLECYSTECTOMY  05/2006  . LUMBAR LAMINECTOMY/DECOMPRESSION MICRODISCECTOMY N/A 02/29/2016   Procedure: Left L4-5 Lateral Recess Decompression, Removal Extradural Intraspinal Facet  Cyst;  Surgeon: Marybelle Killings, MD;  Location: [facility];  Service: Orthopedics;  Laterality: N/A;  . ORIF MANDIBULAR FRACTURE Left 08/13/2004   ORIF of left body fracture mandible with KLS Martin 2.3-mm six hole/notes 03/08/2011   Family History  Problem Relation Age of Onset  . Heart failure Father   . Hypertension Father   . Diabetes Brother   . Heart attack Brother   . Alzheimer's disease Mother   . Stroke Sister   . Diabetes Sister   . Alzheimer's disease Sister   . Hypertension Brother   . Diabetes Brother   . Drug abuse Brother   . Colon cancer Neg Hx    Social History   Socioeconomic History  . Marital status: Single    Spouse name: Not on file  . Number of children: 1  . Years of education: Not on file  . Highest education level: Not on file  Occupational History  . Occupation: retired  . Occupation: part time- [employer]  . Financial resource strain: Not hard at all  . Food insecurity:    Worry: Never true    Inability: Never true  . Transportation needs:    Medical: No    Non-medical: No  Tobacco Use  . Smoking status: Current Every Day Smoker    Packs/day: 0.50    Years: 43.00    Pack years: 21.50    Types: Cigarettes  . Smokeless tobacco: Never Used  Substance and Sexual Activity  . Alcohol use: Yes    Comment: 02/28/2018 "maybe 2 beers/month"  . Drug use: No    Types: Cocaine    Comment: hx of crack/cocaine 35yr ago  . Sexual activity: Not Currently  Lifestyle  . Physical activity:    Days per week: 7 days    Minutes per session: 20 min  . Stress: Only a little  Relationships  . Social connections:    Talks on phone: Three times a week    Gets together: Three times a week    Attends religious service: More than 4 times per year    Active member of club or organization: No    Attends meetings of clubs or organizations: Never    Relationship status: Never married  Other Topics Concern  . Not on file  Social History Narrative   Retired.        As of 05/2015:   Diet: No salt    Caffeine   Married: Single    House: [place] 2 stories, 1 person (self)    Pets: No    Current/Past profession: N/A   Exercise: walks daily    Living Will: Yes    DNR   POA/HPOA: No  Outpatient Encounter Medications as of 06/26/2018  Medication Sig  . allopurinol (ZYLOPRIM) 100 MG tablet Take 1 tablet (100 mg total) by mouth daily.  [name] aspirin EC 81 MG tablet Take 81 mg by mouth daily.  [name] atorvastatin (LIPITOR) 20 MG tablet Take 1 tablet (20 mg total) by mouth daily.  . B-D UF III MINI PEN NEEDLES 31G X 5 MM MISC use four times a day  . bictegravir-emtricitabine-tenofovir AF (BIKTARVY) 50-200-25 MG TABS tablet Take 1 tablet by mouth daily.  . Blood Glucose Monitoring Suppl (ONE TOUCH ULTRA MINI) w/Device KIT 1 each by Does not apply route 2 (two) times daily. Dx: E11.40  . carvedilol (COREG) 25 MG tablet TAKE 1 TABLET BY MOUTH TWICE A DAY WITH MEALS  . Choline Fenofibrate (FENOFIBRIC ACID) 135 MG CPDR Take 1 capsule by mouth daily.  . colchicine 0.6 MG tablet Take one tablet by mouth as needed for gout  . furosemide (LASIX) 80 MG tablet Take 1.5 tablets (120 mg total) by mouth 2 (two) times daily.  [name] gabapentin (NEURONTIN) 300 MG capsule TAKE 1 CAPSULE BY MOUTH THREE TIMES A DAY  . Glucosamine-Chondroit-Vit C-Mn (GLUCOSAMINE CHONDR 1500 COMPLX PO) Take 2 tablets by mouth daily. Reported on 04/04/2016  . glucose blood test strip Use to test blood sugar three times daily. E11.40  . hydrALAZINE (APRESOLINE) 25 MG tablet TAKE 1 TABLET BY MOUTH THREE TIMES DAILY  . insulin lispro (HUMALOG KWIKPEN) 100 UNIT/ML KiwkPen Inject 18 units subcutaneously three times daily before meals  . isosorbide mononitrate (IMDUR) 30 MG 24 hr tablet Take 1 tablet (30 mg total) by mouth daily.  [name] LANTUS SOLOSTAR 100 UNIT/ML Solostar Pen Inject 74 units subcutaneously every evening  . latanoprost (XALATAN) 0.005 % ophthalmic solution Place 1 drop into both eyes at  bedtime. Reported on 04/04/2016  . nitroGLYCERIN (NITROSTAT) 0.3 MG SL tablet Place 1 tablet (0.3 mg total) under the tongue as needed for chest pain (not to exceed 3 tablets in one day.).  [name] sodium bicarbonate 650 MG tablet 1 by mouth two times daily rx'ed by Dr.Upton  . umeclidinium-vilanterol (ANORO ELLIPTA) 62.5-25 MCG/INH AEPB INHALE 1 PUFF BY MOUTH EVERY DAY  . gentamicin cream (GARAMYCIN) 0.1 % Apply 1 application topically 3 (three) times daily. (Patient not taking: Reported on 06/26/2018)   No facility-administered encounter medications on file as of 06/26/2018.     Activities of Daily Living In your present state of health, do you have any difficulty performing the following activities: 06/26/2018 02/28/2018  Hearing? N N  Vision? N N  Difficulty concentrating or making decisions? N N  Walking or climbing stairs? N N  Dressing or bathing? N N  Doing errands, shopping? N Y  Comment - "I don't drive"  Preparing Food and eating ? N -  Using the Toilet? N -  In the past six months, have you accidently leaked urine? N -  Do you have problems with loss of bowel control? N -  Managing your Medications? N -  Managing your Finances? N -  Housekeeping or managing your Housekeeping? N -  Some recent data might be hidden    Patient Care Team: Gildardo Cranker, DO as PCP - General (Internal Medicine) [name], MD as PCP - Infectious Diseases (Infectious Diseases) [name], MD as Consulting Physician (Ophthalmology) [name], MD as Consulting Physician (Cardiology) [name], MD as Consulting Physician (Infectious Diseases) Gardiner Barefoot, DPM as Consulting Physician (Podiatry)   Assessment:   This is  a routine wellness examination for Jacob [name].  Exercise Activities and Dietary recommendations Current Exercise Habits: Home exercise routine, Type of exercise: walking, Time (Minutes): 20, Frequency (Times/Week): 7, Weekly Exercise (Minutes/Week): 140, Intensity:  Mild  Goals    . Blood Pressure < 140/90    . Exercise 3x per week (30 min per time)     Pt will start using silver sneakers 2-3 times a week    . HEMOGLOBIN A1C < 7.0    . LDL CALC < 100    . Quit smoking / using tobacco     Starting 08/18/16, I will attempt to stop smoking over the next year.        Fall Risk Fall Risk  06/26/2018 05/30/2018 03/13/2018 02/27/2018 01/26/2018  Falls in the past year? Yes No No Yes Yes  Comment - - - - -  Number falls in past yr: 2 or more - - 1 1  Comment drunk, and slipped on wet grass - - Stumbled in April 2019 slipped  Injury with Fall? No - - No No  Comment - - - - -  Risk Factor Category  - - - - -  Risk for fall due to : - - - - -  Risk for fall due to: Comment - - - - -  Follow up - - - - -   Is the patient's home free of loose throw rugs in walkways, pet beds, electrical cords, etc?   yes      Grab bars in the bathroom? no      Handrails on the stairs?   yes      Adequate lighting?   yes  Timed Get Up and Go Performed: 11 seconds  Depression Screen PHQ 2/9 Scores 06/26/2018 12/27/2017 10/11/2017 07/21/2017  PHQ - 2 Score 0 0 0 0  PHQ- 9 Score - - - -    Cognitive Function MMSE - Mini Mental State Exam 06/26/2018 07/21/2017 08/18/2016 08/07/2015  Not completed: - - - (No Data)  Orientation to time '4 5 5 5  ' Orientation to Place '5 5 5 5  ' Registration '3 3 3 3  ' Attention/ Calculation '5 5 5 5  ' Recall '2 2 2 3  ' Language- name 2 objects '2 2 2 2  ' Language- repeat '1 1 1 1  ' Language- follow 3 step command '3 3 3 3  ' Language- read & follow direction '1 1 1 1  ' Write a sentence '1 1 1 1  ' Copy design 0 1 1 0  Total score '27 29 29 29        ' Immunization History  Administered Date(s) Administered  . Hepatitis B 08/28/2006, 10/02/2007, 04/01/2008  . Hepatitis B, adult 06/03/2014, 07/04/2014  . Influenza Split 07/28/2011  . Influenza Whole 08/28/2006, 09/10/2007, 09/15/2008, 08/03/2009, 07/26/2010  . Influenza,inj,Quad PF,6+ Mos 07/04/2014,  07/06/2015, 07/12/2016, 07/11/2017  . Influenza-Unspecified 06/24/2013  . Pneumococcal Conjugate-13 08/18/2016  . Pneumococcal Polysaccharide-23 08/28/2006, 07/28/2011, 05/30/2018  . Zoster 06/03/2014  . Zoster Recombinat (Shingrix) 07/24/2017, 01/09/2018    Qualifies for Shingles Vaccine? Up to date, completed  Screening Tests Health Maintenance  Topic Date Due  . INFLUENZA VACCINE  05/24/2018  . OPHTHALMOLOGY EXAM  06/30/2018  . HEMOGLOBIN A1C  08/30/2018  . FOOT EXAM  10/11/2018  . LIPID PANEL  05/31/2019  . TETANUS/TDAP  07/27/2021  . COLONOSCOPY  10/01/2022  . Hepatitis C Screening  Completed  . PNA vac Low Risk Adult  Completed   Cancer Screenings: Lung: Low Dose  CT Chest recommended if Age 15-80 years, 30 pack-year currently smoking OR have quit w/in 15years. Patient does qualify. Colorectal: up to date  Additional Screenings:  Hepatitis C Screening:declined Flu vaccine due: Patient wants to get high dose at pharmacy      Plan:    I have personally reviewed and addressed the Medicare Annual Wellness questionnaire and have noted the following in the patient's chart:  A. Medical and social history B. Use of alcohol, tobacco or illicit drugs  C. Current medications and supplements D. Functional ability and status E.  Nutritional status F.  Physical activity G. Advance directives H. List of other physicians I.  Hospitalizations, surgeries, and ER visits in previous 12 months J.  [place] to include hearing, vision, cognitive, depression L. Referrals and appointments - none  In addition, I have reviewed and discussed with patient certain preventive protocols, quality metrics, and best practice recommendations. A written personalized care plan for preventive services as well as general preventive health recommendations were provided to patient.  See attached scanned questionnaire for additional information.   Signed,   Tyson Dense, RN Nurse Health  Advisor  Patient Concerns: None

## 2018-06-26 NOTE — Patient Instructions (Addendum)
[name] , Thank you for taking time to come for your Medicare Wellness Visit. I appreciate your ongoing commitment to your health goals. Please review the following plan we discussed and let me know if I can assist you in the future.   Screening recommendations/referrals: Colonoscopy up to date, due 10/01/2022 Recommended yearly ophthalmology/optometry visit for glaucoma screening and checkup Recommended yearly dental visit for hygiene and checkup  Vaccinations: Influenza vaccine due, please get high dose at your pharmacy Pneumococcal vaccine up to date, completed Tdap vaccine up to date, due 08/09/2022 Shingles vaccine up to date, completed    Advanced directives: in chart  Conditions/risks identified: none  Next appointment: Sherrie Mustache, NP 08/30/2018 @ 10:15am             Tyson Dense, RN 06/26/2018 @ 10am  Preventive Care 65 Years and Older, Male Preventive care refers to lifestyle choices and visits with your health care provider that can promote health and wellness. What does preventive care include?  A yearly physical exam. This is also called an annual well check.  Dental exams once or twice a year.  Routine eye exams. Ask your health care provider how often you should have your eyes checked.  Personal lifestyle choices, including:  Daily care of your teeth and gums.  Regular physical activity.  Eating a healthy diet.  Avoiding tobacco and drug use.  Limiting alcohol use.  Practicing safe sex.  Taking low doses of aspirin every day.  Taking vitamin and mineral supplements as recommended by your health care provider. What happens during an annual well check? The services and screenings done by your health care provider during your annual well check will depend on your age, overall health, lifestyle risk factors, and family history of disease. Counseling  Your health care provider may ask you questions about your:  Alcohol use.  Tobacco use.  Drug  use.  Emotional well-being.  Home and relationship well-being.  Sexual activity.  Eating habits.  History of falls.  Memory and ability to understand (cognition).  Work and work Statistician. Screening  You may have the following tests or measurements:  Height, weight, and BMI.  Blood pressure.  Lipid and cholesterol levels. These may be checked every 5 years, or more frequently if you are over 82 years old.  Skin check.  Lung cancer screening. You may have this screening every year starting at age 35 if you have a 30-pack-year history of smoking and currently smoke or have quit within the past 15 years.  Fecal occult blood test (FOBT) of the stool. You may have this test every year starting at age 57.  Flexible sigmoidoscopy or colonoscopy. You may have a sigmoidoscopy every 5 years or a colonoscopy every 10 years starting at age 75.  Prostate cancer screening. Recommendations will vary depending on your family history and other risks.  Hepatitis C blood test.  Hepatitis B blood test.  Sexually transmitted disease (STD) testing.  Diabetes screening. This is done by checking your blood sugar (glucose) after you have not eaten for a while (fasting). You may have this done every 1-3 years.  Abdominal aortic aneurysm (AAA) screening. You may need this if you are a current or former smoker.  Osteoporosis. You may be screened starting at age 75 if you are at high risk. Talk with your health care provider about your test results, treatment options, and if necessary, the need for more tests. Vaccines  Your health care provider may recommend certain vaccines, such  as:  Influenza vaccine. This is recommended every year.  Tetanus, diphtheria, and acellular pertussis (Tdap, Td) vaccine. You may need a Td booster every 10 years.  Zoster vaccine. You may need this after age 40.  Pneumococcal 13-valent conjugate (PCV13) vaccine. One dose is recommended after age  66.  Pneumococcal polysaccharide (PPSV23) vaccine. One dose is recommended after age 4. Talk to your health care provider about which screenings and vaccines you need and how often you need them. This information is not intended to replace advice given to you by your health care provider. Make sure you discuss any questions you have with your health care provider. Document Released: 11/06/2015 Document Revised: 06/29/2016 Document Reviewed: 08/11/2015 Elsevier Interactive Patient Education  2017 [place] Prevention in the Home Falls can cause injuries. They can happen to people of all ages. There are many things you can do to make your home safe and to help prevent falls. What can I do on the outside of my home?  Regularly fix the edges of walkways and driveways and fix any cracks.  Remove anything that might make you trip as you walk through a door, such as a raised step or threshold.  Trim any bushes or trees on the path to your home.  Use bright outdoor lighting.  Clear any walking paths of anything that might make someone trip, such as rocks or tools.  Regularly check to see if handrails are loose or broken. Make sure that both sides of any steps have handrails.  Any raised decks and porches should have guardrails on the edges.  Have any leaves, snow, or ice cleared regularly.  Use sand or salt on walking paths during winter.  Clean up any spills in your garage right away. This includes oil or grease spills. What can I do in the bathroom?  Use night lights.  Install grab bars by the toilet and in the tub and shower. Do not use towel bars as grab bars.  Use non-skid mats or decals in the tub or shower.  If you need to sit down in the shower, use a plastic, non-slip stool.  Keep the floor dry. Clean up any water that spills on the floor as soon as it happens.  Remove soap buildup in the tub or shower regularly.  Attach bath mats securely with double-sided  non-slip rug tape.  Do not have throw rugs and other things on the floor that can make you trip. What can I do in the bedroom?  Use night lights.  Make sure that you have a light by your bed that is easy to reach.  Do not use any sheets or blankets that are too big for your bed. They should not hang down onto the floor.  Have a firm chair that has side arms. You can use this for support while you get dressed.  Do not have throw rugs and other things on the floor that can make you trip. What can I do in the kitchen?  Clean up any spills right away.  Avoid walking on wet floors.  Keep items that you use a lot in easy-to-reach places.  If you need to reach something above you, use a strong step stool that has a grab bar.  Keep electrical cords out of the way.  Do not use floor polish or wax that makes floors slippery. If you must use wax, use non-skid floor wax.  Do not have throw rugs and other things on the  floor that can make you trip. What can I do with my stairs?  Do not leave any items on the stairs.  Make sure that there are handrails on both sides of the stairs and use them. Fix handrails that are broken or loose. Make sure that handrails are as long as the stairways.  Check any carpeting to make sure that it is firmly attached to the stairs. Fix any carpet that is loose or worn.  Avoid having throw rugs at the top or bottom of the stairs. If you do have throw rugs, attach them to the floor with carpet tape.  Make sure that you have a light switch at the top of the stairs and the bottom of the stairs. If you do not have them, ask someone to add them for you. What else can I do to help prevent falls?  Wear shoes that:  Do not have high heels.  Have rubber bottoms.  Are comfortable and fit you well.  Are closed at the toe. Do not wear sandals.  If you use a stepladder:  Make sure that it is fully opened. Do not climb a closed stepladder.  Make sure that both  sides of the stepladder are locked into place.  Ask someone to hold it for you, if possible.  Clearly mark and make sure that you can see:  Any grab bars or handrails.  First and last steps.  Where the edge of each step is.  Use tools that help you move around (mobility aids) if they are needed. These include:  Canes.  Walkers.  Scooters.  Crutches.  Turn on the lights when you go into a dark area. Replace any light bulbs as soon as they burn out.  Set up your furniture so you have a clear path. Avoid moving your furniture around.  If any of your floors are uneven, fix them.  If there are any pets around you, be aware of where they are.  Review your medicines with your doctor. Some medicines can make you feel dizzy. This can increase your chance of falling. Ask your doctor what other things that you can do to help prevent falls. This information is not intended to replace advice given to you by your health care provider. Make sure you discuss any questions you have with your health care provider. Document Released: 08/06/2009 Document Revised: 03/17/2016 Document Reviewed: 11/14/2014 Elsevier Interactive Patient Education  2017 [employer].

## 2018-07-02 ENCOUNTER — Ambulatory Visit (INDEPENDENT_AMBULATORY_CARE_PROVIDER_SITE_OTHER): Payer: Medicare Other | Admitting: Internal Medicine

## 2018-07-02 ENCOUNTER — Encounter: Payer: Self-pay | Admitting: Internal Medicine

## 2018-07-02 DIAGNOSIS — B20 Human immunodeficiency virus [HIV] disease: Secondary | ICD-10-CM

## 2018-07-02 NOTE — Assessment & Plan Note (Signed)
His infection is under excellent, long-term control.  He will continue Biktarvy and follow-up after lab work in 1 year. 

## 2018-07-02 NOTE — Progress Notes (Signed)
Patient Active Problem List   Diagnosis Date Noted  . Human immunodeficiency virus (HIV) disease ([place]) 09/02/2006    Priority: High  . CKD stage 4 due to type 2 diabetes mellitus ([place]) 05/30/2018  . Hyperlipidemia associated with type 2 diabetes mellitus ([place]) 05/30/2018  . Acute respiratory failure ([place]) 03/01/2018  . Respiratory distress 02/28/2018  . Acute respiratory distress 02/28/2018  . Right foot ulcer ([place]) 02/28/2018  . Chronic obstructive pulmonary disease ([place]) 02/28/2018  . Anemia of chronic disease 11/20/2016  . CKD (chronic kidney disease) stage 3, GFR 30-59 ml/min (HCC) 11/20/2016  . CHF (congestive heart failure) ([place]) 11/10/2016  . Protein-calorie malnutrition, severe 08/03/2016  . Severe sepsis ([place]) 08/02/2016  . Pneumonia 08/02/2016  . History of lumbar laminectomy for spinal cord decompression 02/29/2016  . Acute renal failure superimposed on stage 3 chronic kidney disease ([place]) 11/03/2015  . Dehydration 11/03/2015  . Alcohol dependence ([place]) 11/03/2015  . Type 2 diabetes mellitus with vascular disease ([place]) 06/17/2015  . HTN (hypertension) 04/26/2015  . Syncope 04/26/2015  . DM (diabetes mellitus) ([place]) 04/26/2015  . Ischemic cardiomyopathy 05/12/2014  . Diarrhea 09/08/2013  . Shock ([place]) 09/06/2013  . Ulcer of lower extremity ([place]) 09/06/2013  . Community acquired pneumonia 09/06/2013  . Hyponatremia 09/06/2013  . Hyperkalemia 09/06/2013  . Hyperglycemia 09/06/2013  . Elevated transaminase level 09/06/2013  . Chest pain 09/06/2013  . Type II or unspecified type diabetes mellitus with unspecified complication, not stated as uncontrolled 10/22/2012  . Black stool 04/17/2012  . Bunion of left foot 11/25/2011  . Bunion, right foot 11/25/2011  . Polysubstance abuse ([place]) 07/28/2011  . Hip fracture, left ([place]) 04/04/2011  . Closed fracture of neck of femur ([place]) 04/04/2011  . Insomnia 02/18/2011  . CAD (coronary artery disease) 04/20/2009    . Acute on chronic combined systolic (congestive) and diastolic (congestive) heart failure ([place]) 04/20/2009  . HLD (hyperlipidemia) 11/20/2006  . Gout 11/20/2006  . TOBACCO ABUSE 11/20/2006  . Essential hypertension 11/20/2006  . SYPHILIS 09/02/2006    Patient's Medications  New Prescriptions   No medications on file  Previous Medications   ALLOPURINOL (ZYLOPRIM) 100 MG TABLET    Take 1 tablet (100 mg total) by mouth daily.   ASPIRIN EC 81 MG TABLET    Take 81 mg by mouth daily.   ATORVASTATIN (LIPITOR) 20 MG TABLET    Take 1 tablet (20 mg total) by mouth daily.   B-D UF III MINI PEN NEEDLES 31G X 5 MM MISC    use four times a day   BICTEGRAVIR-EMTRICITABINE-TENOFOVIR AF (BIKTARVY) 50-200-25 MG TABS TABLET    Take 1 tablet by mouth daily.   BLOOD GLUCOSE MONITORING SUPPL (ONE TOUCH ULTRA MINI) W/DEVICE KIT    1 each by Does not apply route 2 (two) times daily. Dx: E11.40   CARVEDILOL (COREG) 25 MG TABLET    TAKE 1 TABLET BY MOUTH TWICE A DAY WITH MEALS   CHOLINE FENOFIBRATE (FENOFIBRIC ACID) 135 MG CPDR    Take 1 capsule by mouth daily.   COLCHICINE 0.6 MG TABLET    Take one tablet by mouth as needed for gout   FUROSEMIDE (LASIX) 80 MG TABLET    Take 1.5 tablets (120 mg total) by mouth 2 (two) times daily.   GABAPENTIN (NEURONTIN) 300 MG CAPSULE    TAKE 1 CAPSULE BY MOUTH THREE TIMES A DAY   GENTAMICIN CREAM (GARAMYCIN) 0.1 %    Apply  1 application topically 3 (three) times daily.   GLUCOSAMINE-CHONDROIT-VIT C-MN (GLUCOSAMINE CHONDR 1500 COMPLX PO)    Take 2 tablets by mouth daily. Reported on 04/04/2016   GLUCOSE BLOOD TEST STRIP    Use to test blood sugar three times daily. E11.40   HYDRALAZINE (APRESOLINE) 25 MG TABLET    TAKE 1 TABLET BY MOUTH THREE TIMES DAILY   INSULIN LISPRO (HUMALOG KWIKPEN) 100 UNIT/ML KIWKPEN    Inject 18 units subcutaneously three times daily before meals   ISOSORBIDE MONONITRATE (IMDUR) 30 MG 24 HR TABLET    Take 1 tablet (30 mg total) by mouth daily.    LANTUS SOLOSTAR 100 UNIT/ML SOLOSTAR PEN    Inject 74 units subcutaneously every evening   LATANOPROST (XALATAN) 0.005 % OPHTHALMIC SOLUTION    Place 1 drop into both eyes at bedtime. Reported on 04/04/2016   NITROGLYCERIN (NITROSTAT) 0.3 MG SL TABLET    Place 1 tablet (0.3 mg total) under the tongue as needed for chest pain (not to exceed 3 tablets in one day.).   SODIUM BICARBONATE 650 MG TABLET    1 by mouth two times daily rx'ed by Dr.Upton   UMECLIDINIUM-VILANTEROL (ANORO ELLIPTA) 62.5-25 MCG/INH AEPB    INHALE 1 PUFF BY MOUTH EVERY DAY  Modified Medications   No medications on file  Discontinued Medications   No medications on file    Subjective: Jacob [name] is in for his routine HIV follow-up visit.  He has had no problems obtaining, taking or tolerating his Biktarvy.  He believes he missed 1 dose last week but does not recall missing others.  He is feeling well.  He is planning on getting his flu vaccine this afternoon at [facility].  Review of Systems: Review of Systems  Constitutional: Negative for chills, diaphoresis, fever, malaise/fatigue and weight loss.  HENT: Negative for sore throat.   Respiratory: Negative for cough, sputum production and shortness of breath.   Cardiovascular: Negative for chest pain.  Gastrointestinal: Negative for abdominal pain, diarrhea, heartburn, nausea and vomiting.  Genitourinary: Negative for dysuria and frequency.  Musculoskeletal: Negative for joint pain and myalgias.  Skin: Negative for rash.  Neurological: Negative for dizziness and headaches.  Psychiatric/Behavioral: Negative for depression and substance abuse. The patient is not nervous/anxious.     Past Medical History:  Diagnosis Date  . Arthritis    "all over; mostly knees and back" (02/28/2018)  . CHF (congestive heart failure) (HCC)    not on any meds  . Chronic lower back pain    stenosis  . CKD (chronic kidney disease), stage V ([place])    [name] 02/28/2018  . Coronary atherosclerosis of native  coronary artery 2005   s/p surgery  . Drug abuse ([place])    hx; tested for cocaine as recently as 2/08. says he is not using drugs now - avoided defib. for this reason   . GERD (gastroesophageal reflux disease)    takes OTC meds as needed  . Glaucoma    uses eye drops daily  . Hepatitis B 1968   "tx'd w/isolation; caught it from toilet stools in gym"  . History of colon polyps    benign  . History of gout    takes Allopurinol daily as well as Colchicine-if needed (02/28/2018)  . History of kidney stones   . HTN (hypertension)    takes Coreg,Imdur.and Apresoline daily  . Human immunodeficiency virus (HIV) disease ([place]) dx'd 1995   takes Genvoya daily  . Hyperlipidemia    takes Atorvastatin  daily  . Ischemic cardiomyopathy   . Muscle spasm    takes Zanaflex as needed  . Myocardial infarction ([place]) ~ 2004/2005  . Nocturia   . Peripheral neuropathy    takes gabapentin daily  . Pneumonia    "at least twice" (02/28/2018)  . Shortness of breath dyspnea    rarely but if notices it then with exertion  . Syphilis, unspecified   . Type II diabetes mellitus ([place]) 2004   Lantus daily.Average fasting blood sugar 125-199    Social History   Tobacco Use  . Smoking status: Current Every Day Smoker    Packs/day: 0.50    Years: 43.00    Pack years: 21.50    Types: Cigarettes  . Smokeless tobacco: Never Used  Substance Use Topics  . Alcohol use: Yes    Comment: 02/28/2018 "maybe 2 beers/month"  . Drug use: No    Types: Cocaine    Comment: hx of crack/cocaine 65yr ago    Family History  Problem Relation Age of Onset  . Heart failure Father   . Hypertension Father   . Diabetes Brother   . Heart attack Brother   . Alzheimer's disease Mother   . Stroke Sister   . Diabetes Sister   . Alzheimer's disease Sister   . Hypertension Brother   . Diabetes Brother   . Drug abuse Brother   . Colon cancer Neg Hx     Allergies  Allergen Reactions  . Amphetamines Other (See Comments)    Pt  is unaware of this -     Health Maintenance  Topic Date Due  . OPHTHALMOLOGY EXAM  06/30/2018  . INFLUENZA VACCINE  02/19/2019 (Originally 05/24/2018)  . HEMOGLOBIN A1C  08/30/2018  . FOOT EXAM  10/11/2018  . LIPID PANEL  05/31/2019  . TETANUS/TDAP  07/27/2021  . COLONOSCOPY  10/01/2022  . Hepatitis C Screening  Completed  . PNA vac Low Risk Adult  Completed    Objective:  Vitals:   07/02/18 1021  BP: 136/68  Pulse: 67  Temp: 98.4 F (36.9 C)  TempSrc: Oral   There is no height or weight on file to calculate BMI.  Physical Exam  Constitutional: He is oriented to person, place, and time.  He is in good spirits.  HENT:  Mouth/Throat: No oropharyngeal exudate.  Eyes: Conjunctivae are normal.  Cardiovascular: Normal rate and regular rhythm.  No murmur heard. Pulmonary/Chest: Effort normal and breath sounds normal.  Abdominal: Soft. He exhibits no mass. There is no tenderness.  Musculoskeletal: Normal range of motion.  Neurological: He is alert and oriented to person, place, and time.  Skin: No rash noted.  Psychiatric: He has a normal mood and affect.    Lab Results Lab Results  Component Value Date   WBC 6.2 03/13/2018   HGB 12.8 (L) 03/13/2018   HCT 36.9 (L) 03/13/2018   MCV 95.1 03/13/2018   PLT 222 03/13/2018    Lab Results  Component Value Date   CREATININE 5.14 (H) 04/25/2018   BUN 79 (H) 04/25/2018   NA 138 04/25/2018   K 4.5 04/25/2018   CL 107 04/25/2018   CO2 19 (L) 04/25/2018    Lab Results  Component Value Date   ALT 25 02/28/2018   AST 37 02/28/2018   ALKPHOS 36 (L) 02/28/2018   BILITOT 1.0 02/28/2018    Lab Results  Component Value Date   CHOL 178 05/30/2018   HDL 29 (L) 05/30/2018   LDLCALC 112 (H)  05/30/2018   TRIG 245 (H) 05/30/2018   CHOLHDL 6.1 (H) 05/30/2018   Lab Results  Component Value Date   LABRPR REACTIVE (A) 12/12/2017   RPRTITER 1:1 (H) 12/12/2017   HIV 1 RNA Quant (copies/mL)  Date Value  06/18/2018 <20  DETECTED (A)  04/25/2018 <20 DETECTED (A)  12/12/2017 <20 NOT DETECTED   CD4 T Cell Abs (/uL)  Date Value  06/18/2018 1,220  12/12/2017 860  06/27/2017 970     Problem List Items Addressed This Visit      High   Human immunodeficiency virus (HIV) disease ([place])    His infection is under excellent, long-term control.  He will continue Biktarvy and follow-up after lab work in 1 year.      Relevant Orders   CBC   T-helper cell (CD4)- ([facility] clinic only)   Comprehensive metabolic panel   Lipid panel   RPR   HIV 1 RNA quant-no reflex-bld        [name], MD [facility] [phone] pager   [phone] cell 07/02/2018, 10:29 AM

## 2018-07-03 ENCOUNTER — Other Ambulatory Visit (HOSPITAL_COMMUNITY): Payer: Medicare Other

## 2018-07-03 ENCOUNTER — Encounter: Payer: Medicare Other | Admitting: Vascular Surgery

## 2018-07-03 ENCOUNTER — Encounter (HOSPITAL_COMMUNITY): Payer: Medicare Other

## 2018-07-04 ENCOUNTER — Ambulatory Visit (INDEPENDENT_AMBULATORY_CARE_PROVIDER_SITE_OTHER): Payer: Medicare Other | Admitting: Podiatry

## 2018-07-04 ENCOUNTER — Other Ambulatory Visit: Payer: Self-pay

## 2018-07-04 DIAGNOSIS — M79676 Pain in unspecified toe(s): Secondary | ICD-10-CM | POA: Diagnosis not present

## 2018-07-04 DIAGNOSIS — L989 Disorder of the skin and subcutaneous tissue, unspecified: Secondary | ICD-10-CM

## 2018-07-04 DIAGNOSIS — E0842 Diabetes mellitus due to underlying condition with diabetic polyneuropathy: Secondary | ICD-10-CM | POA: Diagnosis not present

## 2018-07-04 DIAGNOSIS — B351 Tinea unguium: Secondary | ICD-10-CM | POA: Diagnosis not present

## 2018-07-04 DIAGNOSIS — L97522 Non-pressure chronic ulcer of other part of left foot with fat layer exposed: Secondary | ICD-10-CM

## 2018-07-04 DIAGNOSIS — I70245 Atherosclerosis of native arteries of left leg with ulceration of other part of foot: Secondary | ICD-10-CM

## 2018-07-04 MED ORDER — GENTAMICIN SULFATE 0.1 % EX CREA: 1.0000 "application " | TOPICAL_CREAM | Freq: Three times a day (TID) | CUTANEOUS | 2 refills | Status: DC

## 2018-07-05 DIAGNOSIS — N185 Chronic kidney disease, stage 5: Secondary | ICD-10-CM | POA: Diagnosis not present

## 2018-07-05 DIAGNOSIS — I120 Hypertensive chronic kidney disease with stage 5 chronic kidney disease or end stage renal disease: Secondary | ICD-10-CM | POA: Diagnosis not present

## 2018-07-05 DIAGNOSIS — E1122 Type 2 diabetes mellitus with diabetic chronic kidney disease: Secondary | ICD-10-CM | POA: Diagnosis not present

## 2018-07-05 DIAGNOSIS — D631 Anemia in chronic kidney disease: Secondary | ICD-10-CM | POA: Diagnosis not present

## 2018-07-05 DIAGNOSIS — N2581 Secondary hyperparathyroidism of renal origin: Secondary | ICD-10-CM | POA: Diagnosis not present

## 2018-07-09 ENCOUNTER — Encounter: Payer: Self-pay | Admitting: *Deleted

## 2018-07-09 DIAGNOSIS — H25012 Cortical age-related cataract, left eye: Secondary | ICD-10-CM | POA: Diagnosis not present

## 2018-07-09 DIAGNOSIS — E119 Type 2 diabetes mellitus without complications: Secondary | ICD-10-CM | POA: Diagnosis not present

## 2018-07-09 DIAGNOSIS — H401132 Primary open-angle glaucoma, bilateral, moderate stage: Secondary | ICD-10-CM | POA: Diagnosis not present

## 2018-07-09 DIAGNOSIS — H524 Presbyopia: Secondary | ICD-10-CM | POA: Diagnosis not present

## 2018-07-09 LAB — HM DIABETES EYE EXAM

## 2018-07-10 ENCOUNTER — Encounter: Payer: Self-pay | Admitting: Vascular Surgery

## 2018-07-10 ENCOUNTER — Encounter: Payer: Self-pay | Admitting: *Deleted

## 2018-07-10 ENCOUNTER — Ambulatory Visit (INDEPENDENT_AMBULATORY_CARE_PROVIDER_SITE_OTHER): Payer: Medicare Other | Admitting: Vascular Surgery

## 2018-07-10 ENCOUNTER — Ambulatory Visit (INDEPENDENT_AMBULATORY_CARE_PROVIDER_SITE_OTHER)
Admission: RE | Admit: 2018-07-10 | Discharge: 2018-07-10 | Disposition: A | Discharge status: Home / Self Care | Payer: Medicare Other | Source: Ambulatory Visit | Attending: Vascular Surgery | Admitting: Vascular Surgery

## 2018-07-10 ENCOUNTER — Ambulatory Visit (HOSPITAL_COMMUNITY)
Admission: RE | Admit: 2018-07-10 | Discharge: 2018-07-10 | Disposition: A | Discharge status: Home / Self Care | Payer: Medicare Other | Source: Ambulatory Visit | Attending: Vascular Surgery | Admitting: Vascular Surgery

## 2018-07-10 ENCOUNTER — Other Ambulatory Visit: Payer: Self-pay

## 2018-07-10 VITALS — BP 158/87 | HR 66 | Resp 20 | Ht 74.0 in | Wt 204.0 lb

## 2018-07-10 DIAGNOSIS — E1122 Type 2 diabetes mellitus with diabetic chronic kidney disease: Secondary | ICD-10-CM

## 2018-07-10 DIAGNOSIS — N184 Chronic kidney disease, stage 4 (severe): Secondary | ICD-10-CM

## 2018-07-10 DIAGNOSIS — Z01818 Encounter for other preprocedural examination: Secondary | ICD-10-CM | POA: Diagnosis not present

## 2018-07-10 NOTE — Progress Notes (Signed)
Patient name: Jacob [name] MRN: [id number] DOB: 30-Jun-1951 Sex: male  REASON FOR CONSULT: New dialysis access  HPI: Jacob [name] is a 67 y.o. male with multiple medical co-morbidities including CHF, HIV, CAD, stage IV chronic kidney disease that presents for new hemodialysis access.  Patient states he has not started dialyzing yet.  He has never had a catheter or fistula previously.  He denies any upper arm surgeries.  He he is retired but states he is right-hand dominant.  Denies any blood thinners.  Denies any trauma to upper extremity.  He does currently use tobacco.  Past Medical History:  Diagnosis Date  . Arthritis    "all over; mostly knees and back" (02/28/2018)  . CHF (congestive heart failure) (HCC)    not on any meds  . Chronic lower back pain    stenosis  . CKD (chronic kidney disease), stage V ([place])    [name] 02/28/2018  . Coronary atherosclerosis of native coronary artery 2005   s/p surgery  . Drug abuse ([place])    hx; tested for cocaine as recently as 2/08. says he is not using drugs now - avoided defib. for this reason   . GERD (gastroesophageal reflux disease)    takes OTC meds as needed  . Glaucoma    uses eye drops daily  . Hepatitis B 1968   "tx'd w/isolation; caught it from toilet stools in gym"  . History of colon polyps    benign  . History of gout    takes Allopurinol daily as well as Colchicine-if needed (02/28/2018)  . History of kidney stones   . HTN (hypertension)    takes Coreg,Imdur.and Apresoline daily  . Human immunodeficiency virus (HIV) disease ([place]) dx'd 1995   takes Genvoya daily  . Hyperlipidemia    takes Atorvastatin daily  . Ischemic cardiomyopathy   . Muscle spasm    takes Zanaflex as needed  . Myocardial infarction ([place]) ~ 2004/2005  . Nocturia   . Peripheral neuropathy    takes gabapentin daily  . Pneumonia    "at least twice" (02/28/2018)  . Shortness of breath dyspnea    rarely but if notices it then with exertion  .  Syphilis, unspecified   . Type II diabetes mellitus ([place]) 2004   Lantus daily.Average fasting blood sugar 125-199    Past Surgical History:  Procedure Laterality Date  . BACK SURGERY    . CARDIAC CATHETERIZATION  10/2002; 12/19/2004   [name] 03/08/2011  . COLONOSCOPY  2013   Dr.John Henrene Pastor   . CORONARY ARTERY BYPASS GRAFT  02/24/2003   CABG X2/notes 03/08/2011  . FRACTURE SURGERY    . INTERTROCHANTERIC HIP FRACTURE SURGERY Left 11/2006   [name] 03/08/2011  . LAPAROSCOPIC CHOLECYSTECTOMY  05/2006  . LUMBAR LAMINECTOMY/DECOMPRESSION MICRODISCECTOMY N/A 02/29/2016   Procedure: Left L4-5 Lateral Recess Decompression, Removal Extradural Intraspinal Facet Cyst;  Surgeon: Marybelle Killings, MD;  Location: [facility];  Service: Orthopedics;  Laterality: N/A;  . ORIF MANDIBULAR FRACTURE Left 08/13/2004   ORIF of left body fracture mandible with KLS Martin 2.3-mm six hole/notes 03/08/2011    Family History  Problem Relation Age of Onset  . Heart failure Father   . Hypertension Father   . Diabetes Brother   . Heart attack Brother   . Alzheimer's disease Mother   . Stroke Sister   . Diabetes Sister   . Alzheimer's disease Sister   . Hypertension Brother   . Diabetes Brother   . Drug  abuse Brother   . Colon cancer Neg Hx     SOCIAL HISTORY: Social History   Socioeconomic History  . Marital status: Single    Spouse name: Not on file  . Number of children: 1  . Years of education: Not on file  . Highest education level: Not on file  Occupational History  . Occupation: retired  . Occupation: part time- [employer]  . Financial resource strain: Not hard at all  . Food insecurity:    Worry: Never true    Inability: Never true  . Transportation needs:    Medical: No    Non-medical: No  Tobacco Use  . Smoking status: Current Every Day Smoker    Packs/day: 0.50    Years: 43.00    Pack years: 21.50    Types: Cigarettes  . Smokeless tobacco: Never Used  Substance and Sexual  Activity  . Alcohol use: Yes    Comment: 02/28/2018 "maybe 2 beers/month"  . Drug use: No    Types: Cocaine    Comment: hx of crack/cocaine 55yr ago  . Sexual activity: Not Currently  Lifestyle  . Physical activity:    Days per week: 7 days    Minutes per session: 20 min  . Stress: Only a little  Relationships  . Social connections:    Talks on phone: Three times a week    Gets together: Three times a week    Attends religious service: More than 4 times per year    Active member of club or organization: No    Attends meetings of clubs or organizations: Never    Relationship status: Never married  . Intimate partner violence:    Fear of current or ex partner: No    Emotionally abused: No    Physically abused: No    Forced sexual activity: No  Other Topics Concern  . Not on file  Social History Narrative   Retired.       As of 05/2015:   Diet: No salt    Caffeine   Married: Single    [place] 1 [place] (self)    Pets: No    Current/Past profession: N/A   Exercise: walks daily    Living Will: Yes    DNR   POA/HPOA: No        Allergies  Allergen Reactions  . Amphetamines Other (See Comments)    Pt is unaware of this -     Current Outpatient Medications  Medication Sig Dispense Refill  . allopurinol (ZYLOPRIM) 100 MG tablet Take 1 tablet (100 mg total) by mouth daily. 90 tablet 1  . aspirin EC 81 MG tablet Take 81 mg by mouth daily.    .Marland Kitchenatorvastatin (LIPITOR) 20 MG tablet Take 1 tablet (20 mg total) by mouth daily. 90 tablet 1  . B-D UF III MINI PEN NEEDLES 31G X 5 MM MISC use four times a day 100 each 11  . bictegravir-emtricitabine-tenofovir AF (BIKTARVY) 50-200-25 MG TABS tablet Take 1 tablet by mouth daily. 30 tablet 11  . Blood Glucose Monitoring Suppl (ONE TOUCH ULTRA MINI) w/Device KIT 1 each by Does not apply route 2 (two) times daily. Dx: E11.40 1 each 0  . carvedilol (COREG) 25 MG tablet TAKE 1 TABLET BY MOUTH TWICE A DAY WITH MEALS 180  tablet 1  . Choline Fenofibrate (FENOFIBRIC ACID) 135 MG CPDR Take 1 capsule by mouth daily. 90 capsule 1  . colchicine 0.6 MG tablet  Take one tablet by mouth as needed for gout 30 tablet 2  . elvitegravir-cobicistat-emtricitabine-tenofovir (GENVOYA) 150-150-200-10 MG TABS tablet Take by mouth.    . furosemide (LASIX) 80 MG tablet Take 1.5 tablets (120 mg total) by mouth 2 (two) times daily. 120 tablet 0  . gabapentin (NEURONTIN) 300 MG capsule TAKE 1 CAPSULE BY MOUTH THREE TIMES A DAY 270 capsule 1  . gentamicin cream (GARAMYCIN) 0.1 % Apply 1 application topically 3 (three) times daily. 30 g 2  . Glucosamine-Chondroit-Vit C-Mn (GLUCOSAMINE CHONDR 1500 COMPLX PO) Take 2 tablets by mouth daily. Reported on 04/04/2016    . glucose blood test strip Use to test blood sugar three times daily. E11.40 300 each 3  . hydrALAZINE (APRESOLINE) 25 MG tablet TAKE 1 TABLET BY MOUTH THREE TIMES DAILY 270 tablet 1  . insulin lispro (HUMALOG KWIKPEN) 100 UNIT/ML KiwkPen Inject 18 units subcutaneously three times daily before meals 33 mL 3  . isosorbide mononitrate (IMDUR) 30 MG 24 hr tablet Take 1 tablet (30 mg total) by mouth daily. 90 tablet 1  . LANTUS SOLOSTAR 100 UNIT/ML Solostar Pen Inject 74 units subcutaneously every evening 15 mL 6  . latanoprost (XALATAN) 0.005 % ophthalmic solution Place 1 drop into both eyes at bedtime. Reported on 04/04/2016    . Naftifine HCl 2 % CREA Apply to affected area two times a day    . nitroGLYCERIN (NITROSTAT) 0.3 MG SL tablet Place 1 tablet (0.3 mg total) under the tongue as needed for chest pain (not to exceed 3 tablets in one day.). 75 tablet 0  . sodium bicarbonate 650 MG tablet 1 by mouth two times daily rx'ed by Dr.Upton  3  . telmisartan (MICARDIS) 20 MG tablet Take by mouth.    [name] tiZANidine (ZANAFLEX) 4 MG tablet Take by mouth.    . umeclidinium-vilanterol (ANORO ELLIPTA) 62.5-25 MCG/INH AEPB INHALE 1 PUFF BY MOUTH EVERY DAY 60 each 5   No current  facility-administered medications for this visit.     REVIEW OF SYSTEMS:  '[X]'  denotes positive finding, '[ ]'  denotes negative finding Cardiac  Comments:  Chest pain or chest pressure:    Shortness of breath upon exertion:    Short of breath when lying flat:    Irregular heart rhythm:        Vascular    Pain in calf, thigh, or hip brought on by ambulation: x   Pain in feet at night that wakes you up from your sleep:     Blood clot in your veins:    Leg swelling:         Pulmonary    Oxygen at home:    Productive cough:     Wheezing:         Neurologic    Sudden weakness in arms or legs:     Sudden numbness in arms or legs:     Sudden onset of difficulty speaking or slurred speech:    Temporary loss of vision in one eye:     Problems with dizziness:         Gastrointestinal    Blood in stool:     Vomited blood:         Genitourinary    Burning when urinating:     Blood in urine:        Psychiatric    Major depression:         Hematologic    Bleeding problems:    Problems with blood clotting too  easily:        Skin    Rashes or ulcers:        Constitutional    Fever or chills:      PHYSICAL EXAM: Vitals:   07/10/18 1110 07/10/18 1111  BP: (!) 156/85 (!) 158/87  Pulse: 66   Resp: 20   SpO2: 98%   Weight: 204 lb (92.5 kg)   Height: '6\' 2"'  (1.88 m)     GENERAL: The patient is a well-nourished male, in no acute distress. The vital signs are documented above. CARDIAC: There is a regular rate and rhythm.  VASCULAR:  2+ radial pulse bilateral upper extremities. 2+ brachial pulse bilateral upper extremities. No upper extremity catheters. No upper extremity tissue loss to either hand. Hands are warm with normal capillary refill. PULMONARY: There is good air exchange bilaterally without wheezing or rales. ABDOMEN: Soft and non-tender with normal pitched bowel sounds.  MUSCULOSKELETAL: There are no major deformities or cyanosis. NEUROLOGIC: No focal weakness  or paresthesias are detected. SKIN: There are no ulcers or rashes noted. PSYCHIATRIC: The patient has a normal affect.  DATA:   I independently reviewed his vein mapping which shows a decent cephalic vein in the left forearm that drains into the basilic vein in the upper arm.  Assessment/Plan:  Given that he is right-hand dominant we recommended a left upper extremity fistula.  In review of his vein mapping he does have a decent cephalic vein in the left forearm the drains in the basilic vein with normal triphasic waveforms in the brachial and radial artery.  I think we should be able to do a radiocephalic at the left wrist but if this is not feasible he may require left upper brachiobasilic and we discussed doing this in two stages.  We will get him scheduled for the next available OR day.   Marty Heck, MD [facility] Office: [phone] Pager: [place]

## 2018-07-11 NOTE — Progress Notes (Signed)
SUBJECTIVE Patient with a history of diabetes mellitus presents to office today complaining of elongated, thickened nails that cause pain while ambulating in shoes. He is unable to trim his own nails. He is also here for follow up evaluation of pre-ulcerative callus lesions noted to bilateral feet. He has been applying gentamicin cream as directed for treatment. Patient is here for further evaluation and treatment.   Past Medical History:  Diagnosis Date  . Arthritis    "all over; mostly knees and back" (02/28/2018)  . CHF (congestive heart failure) (HCC)    not on any meds  . Chronic lower back pain    stenosis  . CKD (chronic kidney disease), stage V ([place])    [name] 02/28/2018  . Coronary atherosclerosis of native coronary artery 2005   s/p surgery  . Drug abuse ([place])    hx; tested for cocaine as recently as 2/08. says he is not using drugs now - avoided defib. for this reason   . GERD (gastroesophageal reflux disease)    takes OTC meds as needed  . Glaucoma    uses eye drops daily  . Hepatitis B 1968   "tx'd w/isolation; caught it from toilet stools in gym"  . History of colon polyps    benign  . History of gout    takes Allopurinol daily as well as Colchicine-if needed (02/28/2018)  . History of kidney stones   . HTN (hypertension)    takes Coreg,Imdur.and Apresoline daily  . Human immunodeficiency virus (HIV) disease ([place]) dx'd 1995   takes Genvoya daily  . Hyperlipidemia    takes Atorvastatin daily  . Ischemic cardiomyopathy   . Muscle spasm    takes Zanaflex as needed  . Myocardial infarction ([place]) ~ 2004/2005  . Nocturia   . Peripheral neuropathy    takes gabapentin daily  . Pneumonia    "at least twice" (02/28/2018)  . Shortness of breath dyspnea    rarely but if notices it then with exertion  . Syphilis, unspecified   . Type II diabetes mellitus ([place]) 2004   Lantus daily.Average fasting blood sugar 125-199    OBJECTIVE General Patient is awake, alert, and  oriented x 3 and in no acute distress.  Derm Wound #1 noted to the left hallux measuring 0.6 x 0.6 x 0.2 cm.   To the above-noted ulceration, there is no eschar. There is a moderate amount of slough, fibrin and necrotic tissue. Granulation tissue and wound base is red. There is no malodor. There is a minimal amount of serosanginous drainage noted. Periwound integrity is intact.  Nails are tender, long, thickened and dystrophic with subungual debris, consistent with onychomycosis, 1-5 bilateral. No signs of infection noted. Hyperkeratotic lesions present on the bilateral feet x 4. Pain on palpation with a central nucleated core noted.   Vasc  DP and PT pedal pulses palpable bilaterally. Temperature gradient within normal limits.   Neuro Epicritic and protective threshold sensation diminished bilaterally.   Musculoskeletal Exam No symptomatic pedal deformities noted bilateral. Muscular strength within normal limits.  ASSESSMENT 1. Diabetes Mellitus w/ peripheral neuropathy 2. Onychomycosis of nail due to dermatophyte bilateral 3. Ulceration of the left hallux secondary to diabetes mellitus  4. Pre-ulcerative callus lesions bilateral feet x 4  PLAN OF CARE 1. Patient evaluated today. 2. Medically necessary excisional debridement including subcutaneous tissue was performed using a tissue nipper and a chisel blade. Excisional debridement of all the necrotic nonviable tissue down to healthy bleeding viable tissue was performed  with post-debridement measurements same as pre-. 3. The wound was cleansed and dry sterile dressing applied. 4. Instructed to maintain good pedal hygiene and foot care. Stressed importance of controlling blood sugar.  5. Mechanical debridement of nails 1-5 bilaterally performed using a nail nipper. Filed with dremel without incident.  6. Refill prescription of gentamicin cream provided to patient to be used daily with a bandage. 7. Excisional debridement of keratoic lesion  using a chisel blade was performed without incident. Light dressing applied.  8. Continue wearing DM shoes and insoles.  9. Return to clinic in 4 weeks.     Edrick Kins, DPM [facility]  Dr. Edrick Kins, [address]                                        [place], [place] [address]                Office [phone]  Fax [phone]

## 2018-07-19 ENCOUNTER — Other Ambulatory Visit: Payer: Self-pay | Admitting: Cardiovascular Disease

## 2018-07-20 ENCOUNTER — Other Ambulatory Visit: Payer: Self-pay | Admitting: Internal Medicine

## 2018-07-20 DIAGNOSIS — I10 Essential (primary) hypertension: Secondary | ICD-10-CM

## 2018-07-23 ENCOUNTER — Other Ambulatory Visit: Payer: Self-pay | Admitting: *Deleted

## 2018-08-01 ENCOUNTER — Ambulatory Visit (INDEPENDENT_AMBULATORY_CARE_PROVIDER_SITE_OTHER): Payer: Medicare Other | Admitting: Podiatry

## 2018-08-01 ENCOUNTER — Encounter (HOSPITAL_COMMUNITY): Payer: Self-pay | Admitting: *Deleted

## 2018-08-01 ENCOUNTER — Other Ambulatory Visit: Payer: Self-pay

## 2018-08-01 DIAGNOSIS — E0842 Diabetes mellitus due to underlying condition with diabetic polyneuropathy: Secondary | ICD-10-CM | POA: Diagnosis not present

## 2018-08-01 DIAGNOSIS — L97522 Non-pressure chronic ulcer of other part of left foot with fat layer exposed: Secondary | ICD-10-CM

## 2018-08-01 DIAGNOSIS — I70245 Atherosclerosis of native arteries of left leg with ulceration of other part of foot: Secondary | ICD-10-CM | POA: Diagnosis not present

## 2018-08-01 NOTE — Progress Notes (Signed)
Spoke with Olevia Bowens, RN at [facility] to make MD aware that pt stated that he does not have a caregiver post-op.

## 2018-08-01 NOTE — Progress Notes (Signed)
   08/01/18 1109  OBSTRUCTIVE SLEEP APNEA  Have you ever been diagnosed with sleep apnea through a sleep study? No  Do you snore loudly (loud enough to be heard through closed doors)?  1  Do you often feel tired, fatigued, or sleepy during the daytime (such as falling asleep during driving or talking to someone)? 0  Has anyone observed you stop breathing during your sleep? 0  Do you have, or are you being treated for high blood pressure? 1  BMI more than 35 kg/m2? 0  Age > 50 (1-yes) 1  Neck circumference greater than:Male 16 inches or larger, Male 17inches or larger? 1  Male Gender (Yes=1) 1  Obstructive Sleep Apnea Score 5

## 2018-08-01 NOTE — Progress Notes (Signed)
Ptr denies SOB and chest pain. Pt stated that he is under the care of Dr. [name], Cardiology. Pt made aware to stop taking vitamins, fish oil, Glucosamine and herbal medications. Do not take any NSAIDs ie: Ibuprofen, Advil, Naproxen (Aleve), Motrin, BC and Goody powder. Pt made aware to not take Humalog on DOS. Pt made aware to check BG every 2 hours prior to arrival to hospital on DOS. Pt made aware to take 37 units of Lantus insulin the morning of surgery if BG > 70. Pt made aware to not take Lantus insulin if BG<70 on DOS. Pt made aware to treat a BG < 70 with 4 glucose tabs or glucose gel or 4 ounces of apple or cranberry juice, wait 15 minutes after intervention to recheck BG, if BG remains < 70, call Short Stay unit to speak with a nurse. Pt verbalized understanding of all pre-op instructions.

## 2018-08-02 ENCOUNTER — Encounter (HOSPITAL_COMMUNITY)
Admission: RE | Disposition: A | Discharge status: Home / Self Care | Payer: Self-pay | Source: Ambulatory Visit | Attending: Vascular Surgery

## 2018-08-02 ENCOUNTER — Ambulatory Visit (HOSPITAL_COMMUNITY): Payer: Medicare Other | Admitting: Physician Assistant

## 2018-08-02 ENCOUNTER — Encounter (HOSPITAL_COMMUNITY): Payer: Self-pay | Admitting: Certified Registered"

## 2018-08-02 ENCOUNTER — Ambulatory Visit (HOSPITAL_COMMUNITY)
Admission: RE | Admit: 2018-08-02 | Discharge: 2018-08-02 | Disposition: A | Discharge status: Home / Self Care | Payer: Medicare Other | Source: Ambulatory Visit | Attending: Vascular Surgery | Admitting: Vascular Surgery

## 2018-08-02 DIAGNOSIS — E1122 Type 2 diabetes mellitus with diabetic chronic kidney disease: Secondary | ICD-10-CM | POA: Insufficient documentation

## 2018-08-02 DIAGNOSIS — K219 Gastro-esophageal reflux disease without esophagitis: Secondary | ICD-10-CM | POA: Insufficient documentation

## 2018-08-02 DIAGNOSIS — I252 Old myocardial infarction: Secondary | ICD-10-CM | POA: Insufficient documentation

## 2018-08-02 DIAGNOSIS — Z951 Presence of aortocoronary bypass graft: Secondary | ICD-10-CM | POA: Insufficient documentation

## 2018-08-02 DIAGNOSIS — Z794 Long term (current) use of insulin: Secondary | ICD-10-CM | POA: Diagnosis not present

## 2018-08-02 DIAGNOSIS — E785 Hyperlipidemia, unspecified: Secondary | ICD-10-CM | POA: Diagnosis not present

## 2018-08-02 DIAGNOSIS — I2510 Atherosclerotic heart disease of native coronary artery without angina pectoris: Secondary | ICD-10-CM | POA: Diagnosis not present

## 2018-08-02 DIAGNOSIS — Z8249 Family history of ischemic heart disease and other diseases of the circulatory system: Secondary | ICD-10-CM | POA: Diagnosis not present

## 2018-08-02 DIAGNOSIS — E1140 Type 2 diabetes mellitus with diabetic neuropathy, unspecified: Secondary | ICD-10-CM | POA: Diagnosis not present

## 2018-08-02 DIAGNOSIS — Z79899 Other long term (current) drug therapy: Secondary | ICD-10-CM | POA: Insufficient documentation

## 2018-08-02 DIAGNOSIS — N184 Chronic kidney disease, stage 4 (severe): Secondary | ICD-10-CM | POA: Insufficient documentation

## 2018-08-02 DIAGNOSIS — Z21 Asymptomatic human immunodeficiency virus [HIV] infection status: Secondary | ICD-10-CM | POA: Insufficient documentation

## 2018-08-02 DIAGNOSIS — Z888 Allergy status to other drugs, medicaments and biological substances status: Secondary | ICD-10-CM | POA: Insufficient documentation

## 2018-08-02 DIAGNOSIS — I509 Heart failure, unspecified: Secondary | ICD-10-CM | POA: Diagnosis not present

## 2018-08-02 DIAGNOSIS — M479 Spondylosis, unspecified: Secondary | ICD-10-CM | POA: Insufficient documentation

## 2018-08-02 DIAGNOSIS — Z7982 Long term (current) use of aspirin: Secondary | ICD-10-CM | POA: Diagnosis not present

## 2018-08-02 DIAGNOSIS — M170 Bilateral primary osteoarthritis of knee: Secondary | ICD-10-CM | POA: Diagnosis not present

## 2018-08-02 DIAGNOSIS — I132 Hypertensive heart and chronic kidney disease with heart failure and with stage 5 chronic kidney disease, or end stage renal disease: Secondary | ICD-10-CM | POA: Diagnosis not present

## 2018-08-02 DIAGNOSIS — E1142 Type 2 diabetes mellitus with diabetic polyneuropathy: Secondary | ICD-10-CM | POA: Diagnosis not present

## 2018-08-02 DIAGNOSIS — I130 Hypertensive heart and chronic kidney disease with heart failure and stage 1 through stage 4 chronic kidney disease, or unspecified chronic kidney disease: Secondary | ICD-10-CM | POA: Diagnosis not present

## 2018-08-02 DIAGNOSIS — I5043 Acute on chronic combined systolic (congestive) and diastolic (congestive) heart failure: Secondary | ICD-10-CM | POA: Diagnosis not present

## 2018-08-02 HISTORY — PX: AV FISTULA PLACEMENT: SHX1204

## 2018-08-02 HISTORY — DX: Presence of spectacles and contact lenses: Z97.3

## 2018-08-02 HISTORY — DX: Presence of dental prosthetic device (complete) (partial): Z97.2

## 2018-08-02 LAB — POCT I-STAT 4, (NA,K, GLUC, HGB,HCT)
GLUCOSE: 130 mg/dL — AB (ref 70–99)
HEMATOCRIT: 39 % (ref 39.0–52.0)
Hemoglobin: 13.3 g/dL (ref 13.0–17.0)
POTASSIUM: 4.5 mmol/L (ref 3.5–5.1)
Sodium: 147 mmol/L — ABNORMAL HIGH (ref 135–145)

## 2018-08-02 LAB — GLUCOSE, CAPILLARY
GLUCOSE-CAPILLARY: 111 mg/dL — AB (ref 70–99)
Glucose-Capillary: 138 mg/dL — ABNORMAL HIGH (ref 70–99)
Glucose-Capillary: 122 mg/dL — ABNORMAL HIGH (ref 70–99)

## 2018-08-02 SURGERY — ARTERIOVENOUS (AV) FISTULA CREATION
Anesthesia: Monitor Anesthesia Care | Site: Arm Lower | Laterality: Left

## 2018-08-02 MED ORDER — PAPAVERINE HCL 30 MG/ML IJ SOLN
INTRAMUSCULAR | Status: AC
  Filled 2018-08-02: qty 2

## 2018-08-02 MED ORDER — DEXAMETHASONE SODIUM PHOSPHATE 10 MG/ML IJ SOLN
INTRAMUSCULAR | Status: AC
  Filled 2018-08-02: qty 1

## 2018-08-02 MED ORDER — LIDOCAINE 2% (20 MG/ML) 5 ML SYRINGE
INTRAMUSCULAR | Status: AC
  Filled 2018-08-02: qty 5

## 2018-08-02 MED ORDER — SODIUM CHLORIDE 0.9 % IV SOLN
INTRAVENOUS | Status: DC | PRN
  Administered 2018-08-02: 40 ug/min via INTRAVENOUS

## 2018-08-02 MED ORDER — OXYCODONE-ACETAMINOPHEN 5-325 MG PO TABS: 1.0000 | ORAL_TABLET | Freq: Four times a day (QID) | ORAL | 0 refills | Status: DC | PRN

## 2018-08-02 MED ORDER — LIDOCAINE HCL 1 % IJ SOLN
INTRAMUSCULAR | Status: DC | PRN
  Administered 2018-08-02: 3 mL

## 2018-08-02 MED ORDER — OXYCODONE HCL 5 MG/5ML PO SOLN: 5.0000 mg | Freq: Once | ORAL | Status: DC | PRN

## 2018-08-02 MED ORDER — MIDAZOLAM HCL 5 MG/5ML IJ SOLN
INTRAMUSCULAR | Status: DC | PRN
  Administered 2018-08-02: 2 mg via INTRAVENOUS

## 2018-08-02 MED ORDER — PROPOFOL 10 MG/ML IV BOLUS
INTRAVENOUS | Status: DC | PRN
  Administered 2018-08-02: 30 mg via INTRAVENOUS
  Administered 2018-08-02: 20 mg via INTRAVENOUS
  Administered 2018-08-02: 30 mg via INTRAVENOUS
  Administered 2018-08-02: 20 mg via INTRAVENOUS

## 2018-08-02 MED ORDER — EPHEDRINE SULFATE-NACL 50-0.9 MG/10ML-% IV SOSY
PREFILLED_SYRINGE | INTRAVENOUS | Status: DC | PRN
  Administered 2018-08-02: 5 mg via INTRAVENOUS

## 2018-08-02 MED ORDER — 0.9 % SODIUM CHLORIDE (POUR BTL) OPTIME
TOPICAL | Status: DC | PRN
  Administered 2018-08-02: 1000 mL

## 2018-08-02 MED ORDER — PROPOFOL 10 MG/ML IV BOLUS
INTRAVENOUS | Status: AC
  Filled 2018-08-02: qty 20

## 2018-08-02 MED ORDER — OXYCODONE HCL 5 MG PO TABS: 5.0000 mg | ORAL_TABLET | Freq: Once | ORAL | Status: DC | PRN

## 2018-08-02 MED ORDER — PROMETHAZINE HCL 25 MG/ML IJ SOLN: 6.2500 mg | INTRAMUSCULAR | Status: DC | PRN

## 2018-08-02 MED ORDER — LIDOCAINE 2% (20 MG/ML) 5 ML SYRINGE
INTRAMUSCULAR | Status: DC | PRN
  Administered 2018-08-02 (×2): 50 mg via INTRAVENOUS

## 2018-08-02 MED ORDER — PROTAMINE SULFATE 10 MG/ML IV SOLN
INTRAVENOUS | Status: AC
  Filled 2018-08-02: qty 5

## 2018-08-02 MED ORDER — HEPARIN SODIUM (PORCINE) 1000 UNIT/ML IJ SOLN
INTRAMUSCULAR | Status: AC
  Filled 2018-08-02: qty 1

## 2018-08-02 MED ORDER — SODIUM CHLORIDE 0.9 % IV SOLN
INTRAVENOUS | Status: DC | PRN
  Administered 2018-08-02: 500 mL

## 2018-08-02 MED ORDER — ONDANSETRON HCL 4 MG/2ML IJ SOLN
INTRAMUSCULAR | Status: DC | PRN
  Administered 2018-08-02: 4 mg via INTRAVENOUS

## 2018-08-02 MED ORDER — PROPOFOL 500 MG/50ML IV EMUL
INTRAVENOUS | Status: DC | PRN
  Administered 2018-08-02: 75 ug/kg/min via INTRAVENOUS

## 2018-08-02 MED ORDER — DEXAMETHASONE SODIUM PHOSPHATE 4 MG/ML IJ SOLN
INTRAMUSCULAR | Status: DC | PRN
  Administered 2018-08-02: 6 mg via INTRAVENOUS

## 2018-08-02 MED ORDER — HEPARIN SODIUM (PORCINE) 1000 UNIT/ML IJ SOLN
INTRAMUSCULAR | Status: DC | PRN
  Administered 2018-08-02: 3000 [IU] via INTRAVENOUS

## 2018-08-02 MED ORDER — FENTANYL CITRATE (PF) 100 MCG/2ML IJ SOLN
INTRAMUSCULAR | Status: DC | PRN
  Administered 2018-08-02 (×2): 25 ug via INTRAVENOUS
  Administered 2018-08-02: 50 ug via INTRAVENOUS

## 2018-08-02 MED ORDER — HYDROMORPHONE HCL 1 MG/ML IJ SOLN: 0.2500 mg | INTRAMUSCULAR | Status: DC | PRN

## 2018-08-02 MED ORDER — SODIUM CHLORIDE 0.9 % IV SOLN
INTRAVENOUS | Status: AC
  Filled 2018-08-02: qty 1.2

## 2018-08-02 MED ORDER — CEFAZOLIN SODIUM-DEXTROSE 2-4 GM/100ML-% IV SOLN
INTRAVENOUS | Status: AC
  Filled 2018-08-02: qty 100

## 2018-08-02 MED ORDER — PHENYLEPHRINE 40 MCG/ML (10ML) SYRINGE FOR IV PUSH (FOR BLOOD PRESSURE SUPPORT)
PREFILLED_SYRINGE | INTRAVENOUS | Status: AC
  Filled 2018-08-02: qty 10

## 2018-08-02 MED ORDER — PHENYLEPHRINE 40 MCG/ML (10ML) SYRINGE FOR IV PUSH (FOR BLOOD PRESSURE SUPPORT)
PREFILLED_SYRINGE | INTRAVENOUS | Status: DC | PRN
  Administered 2018-08-02 (×3): 80 ug via INTRAVENOUS
  Administered 2018-08-02: 120 ug via INTRAVENOUS

## 2018-08-02 MED ORDER — ONDANSETRON HCL 4 MG/2ML IJ SOLN
INTRAMUSCULAR | Status: AC
  Filled 2018-08-02: qty 2

## 2018-08-02 MED ORDER — CEFAZOLIN SODIUM-DEXTROSE 2-4 GM/100ML-% IV SOLN
2.0000 g | INTRAVENOUS | Status: AC
  Administered 2018-08-02: 2 g via INTRAVENOUS

## 2018-08-02 MED ORDER — LIDOCAINE HCL (PF) 1 % IJ SOLN
INTRAMUSCULAR | Status: AC
  Filled 2018-08-02: qty 30

## 2018-08-02 MED ORDER — MIDAZOLAM HCL 2 MG/2ML IJ SOLN
INTRAMUSCULAR | Status: AC
  Filled 2018-08-02: qty 2

## 2018-08-02 MED ORDER — SODIUM CHLORIDE 0.9 % IV SOLN
INTRAVENOUS | Status: DC
  Administered 2018-08-02: 11:00:00 via INTRAVENOUS

## 2018-08-02 MED ORDER — FENTANYL CITRATE (PF) 250 MCG/5ML IJ SOLN
INTRAMUSCULAR | Status: AC
  Filled 2018-08-02: qty 5

## 2018-08-02 MED ORDER — EPHEDRINE 5 MG/ML INJ
INTRAVENOUS | Status: AC
  Filled 2018-08-02: qty 10

## 2018-08-02 SURGICAL SUPPLY — 32 items
ARMBAND PINK RESTRICT EXTREMIT (MISCELLANEOUS) ×6 IMPLANT
CANISTER SUCT 3000ML PPV (MISCELLANEOUS) ×3 IMPLANT
CLIP VESOCCLUDE MED 6/CT (CLIP) ×3 IMPLANT
CLIP VESOCCLUDE SM WIDE 6/CT (CLIP) ×3 IMPLANT
COVER PROBE W GEL 5X96 (DRAPES) ×3 IMPLANT
COVER WAND RF STERILE (DRAPES) ×3 IMPLANT
DECANTER SPIKE VIAL GLASS SM (MISCELLANEOUS) ×3 IMPLANT
DERMABOND ADVANCED (GAUZE/BANDAGES/DRESSINGS) ×2
DERMABOND ADVANCED .7 DNX12 (GAUZE/BANDAGES/DRESSINGS) ×1 IMPLANT
ELECT REM PT RETURN 9FT ADLT (ELECTROSURGICAL) ×3
ELECTRODE REM PT RTRN 9FT ADLT (ELECTROSURGICAL) ×1 IMPLANT
GLOVE BIO SURGEON STRL SZ7.5 (GLOVE) ×3 IMPLANT
GLOVE BIOGEL PI IND STRL 8 (GLOVE) ×1 IMPLANT
GLOVE BIOGEL PI INDICATOR 8 (GLOVE) ×2
GOWN STRL REUS W/ TWL LRG LVL3 (GOWN DISPOSABLE) ×2 IMPLANT
GOWN STRL REUS W/ TWL XL LVL3 (GOWN DISPOSABLE) ×2 IMPLANT
GOWN STRL REUS W/TWL LRG LVL3 (GOWN DISPOSABLE) ×4
GOWN STRL REUS W/TWL XL LVL3 (GOWN DISPOSABLE) ×4
HEMOSTAT SPONGE AVITENE ULTRA (HEMOSTASIS) IMPLANT
KIT BASIN OR (CUSTOM PROCEDURE TRAY) ×3 IMPLANT
KIT TURNOVER KIT B (KITS) ×3 IMPLANT
NS IRRIG 1000ML POUR BTL (IV SOLUTION) ×3 IMPLANT
PACK CV ACCESS (CUSTOM PROCEDURE TRAY) ×3 IMPLANT
PAD ARMBOARD 7.5X6 YLW CONV (MISCELLANEOUS) ×6 IMPLANT
SUT MNCRL AB 4-0 PS2 18 (SUTURE) ×3 IMPLANT
SUT PROLENE 6 0 BV (SUTURE) ×3 IMPLANT
SUT PROLENE 7 0 BV 1 (SUTURE) IMPLANT
SUT VIC AB 3-0 SH 27 (SUTURE) ×2
SUT VIC AB 3-0 SH 27X BRD (SUTURE) ×1 IMPLANT
TOWEL GREEN STERILE (TOWEL DISPOSABLE) ×3 IMPLANT
UNDERPAD 30X30 (UNDERPADS AND DIAPERS) ×3 IMPLANT
WATER STERILE IRR 1000ML POUR (IV SOLUTION) ×3 IMPLANT

## 2018-08-02 NOTE — H&P (Signed)
History and Physical Interval Note:  08/02/2018 1:11 PM  [name]  has presented today for surgery, with the diagnosis of chronic kidney disease  The various methods of treatment have been discussed with the patient and family. After consideration of risks, benefits and other options for treatment, the patient has consented to  Procedure(s): ARTERIOVENOUS (AV) FISTULA CREATION (Left) as a surgical intervention .  The patient's history has been reviewed, patient examined, no change in status, stable for surgery.  I have reviewed the patient's chart and labs.  Questions were answered to the patient's satisfaction.     LUE fistula.  Marty Heck    Patient name: Jacob [name]         MRN: [id number]        DOB: 1950/11/01            Sex: male     REASON FOR CONSULT: New dialysis access     HPI:  Jacob [name] is a 67 y.o. male with multiple medical co-morbidities including CHF, HIV, CAD, stage IV chronic kidney disease that presents for new hemodialysis access.  Patient states he has not started dialyzing yet.  He has never had a catheter or fistula previously.  He denies any upper arm surgeries.  He he is retired but states he is right-hand dominant.  Denies any blood thinners.  Denies any trauma to upper extremity.     He does currently use tobacco.          Past Medical History:    Diagnosis   Date    .   Arthritis            "all over; mostly knees and back" (02/28/2018)    .   CHF (congestive heart failure) (HCC)            not on any meds    .   Chronic lower back pain            stenosis    .   CKD (chronic kidney disease), stage V ([place])            [name] 02/28/2018    .   Coronary atherosclerosis of native coronary artery   2005        s/p surgery    .   Drug abuse ([place])            hx; tested for cocaine as recently as 2/08. says he is not using drugs now - avoided defib. for this reason      .   GERD (gastroesophageal reflux disease)            takes OTC meds as needed    .   Glaucoma            uses eye drops daily    .   Hepatitis B   1968        "tx'd w/isolation; caught it from toilet stools in gym"    .   History of colon polyps            benign    .   History of gout            takes Allopurinol daily as well as Colchicine-if needed (02/28/2018)    .   History of kidney stones        .   HTN (hypertension)            takes Coreg,Imdur.and Apresoline daily    .  Human immunodeficiency virus (HIV) disease ([place])   dx'd 1995        takes Genvoya daily    .   Hyperlipidemia            takes Atorvastatin daily    .   Ischemic cardiomyopathy        .   Muscle spasm            takes Zanaflex as needed    .   Myocardial infarction ([place])   ~ 2004/2005    .   Nocturia        .   Peripheral neuropathy            takes gabapentin daily    .   Pneumonia            "at least twice" (02/28/2018)    .   Shortness of breath dyspnea            rarely but if notices it then with exertion    .   Syphilis, unspecified        .   Type II diabetes mellitus ([place])   2004        Lantus daily.Average fasting blood sugar 125-199                Past Surgical History:    Procedure   Laterality   Date    .   BACK SURGERY            .   CARDIAC CATHETERIZATION       10/2002; 12/19/2004        [name] 03/08/2011    .   COLONOSCOPY       2013        Dr.John Henrene Pastor     .   CORONARY ARTERY BYPASS GRAFT       02/24/2003        CABG X2/notes 03/08/2011    .   FRACTURE SURGERY            .   INTERTROCHANTERIC HIP FRACTURE SURGERY   Left   11/2006        [name] 03/08/2011    .   LAPAROSCOPIC CHOLECYSTECTOMY       05/2006    .   LUMBAR  LAMINECTOMY/DECOMPRESSION MICRODISCECTOMY   N/A   02/29/2016        Procedure: Left L4-5 Lateral Recess Decompression, Removal Extradural Intraspinal Facet Cyst;  Surgeon: Marybelle Killings, MD;  Location: [facility];  Service: Orthopedics;  Laterality: N/A;    .   ORIF MANDIBULAR FRACTURE   Left   08/13/2004        ORIF of left body fracture mandible with KLS Martin 2.3-mm six hole/notes 03/08/2011                Family History    Problem   Relation   Age of Onset    .   Heart failure   Father        .   Hypertension   Father        .   Diabetes   Brother        .   Heart attack   Brother        .   Alzheimer's disease   Mother        .   Stroke   Sister        .   Diabetes   Sister        .  Alzheimer's disease   Sister        .   Hypertension   Brother        .   Diabetes   Brother        .   Drug abuse   Brother        .   Colon cancer   Neg Hx              SOCIAL HISTORY:   Social History             Socioeconomic History    .   Marital status:   Single            Spouse name:   Not on file    .   Number of children:   1    .   Years of education:   Not on file    .   Highest education level:   Not on file    Occupational History    .   Occupation:   retired    .   Occupation:   part time- [employer]    .   Financial resource strain:   Not hard at all    .   Food insecurity:            Worry:   Never true            Inability:   Never true    .   Transportation needs:            Medical:   No            Non-medical:   No    Tobacco Use    .   Smoking status:   Current Every Day Smoker            Packs/day:   0.50            Years:   43.00            Pack years:   21.50            Types:    Cigarettes    .   Smokeless tobacco:   Never Used    Substance and Sexual Activity    .   Alcohol use:   Yes            Comment: 02/28/2018 "maybe 2 beers/month"    .   Drug use:   No            Types:   Cocaine            Comment: hx of crack/cocaine 58yr ago    .   Sexual activity:   Not Currently    Lifestyle    .   Physical activity:            Days per week:   7 days            Minutes per session:   20 min    .   Stress:   Only a little    Relationships    .   Social connections:            Talks on phone:   Three times a week            Gets together:   Three times a week            Attends religious service:  More than 4 times per year            Active member of club or organization:   No            Attends meetings of clubs or organizations:   Never            Relationship status:   Never married    .   Intimate partner violence:            Fear of current or ex partner:   No            Emotionally abused:   No            Physically abused:   No            Forced sexual activity:   No    Other Topics   Concern    .   Not on file    Social History Narrative        Retired.                  As of 05/2015:        Diet: No salt         Caffeine        Married: Single         House: [place], 2 stories, 1 person (self)         Pets: No         Current/Past profession: N/A        Exercise: walks daily         Living Will: Yes         DNR        POA/HPOA: No                          Allergies    Allergen   Reactions    .   Amphetamines   Other (See Comments)            Pt is unaware of this -                  Current Outpatient Medications    Medication   Sig   Dispense   Refill    .    allopurinol (ZYLOPRIM) 100 MG tablet   Take 1 tablet (100 mg total) by mouth daily.   90 tablet   1    .   aspirin EC 81 MG tablet   Take 81 mg by mouth daily.            [name]   atorvastatin (LIPITOR) 20 MG tablet   Take 1 tablet (20 mg total) by mouth daily.   90 tablet   1    .   B-D UF III MINI PEN NEEDLES 31G X 5 MM MISC   use four times a day   100 each   11    .   bictegravir-emtricitabine-tenofovir AF (BIKTARVY) 50-200-25 MG TABS tablet   Take 1 tablet by mouth daily.   30 tablet   11    .   Blood Glucose Monitoring Suppl (ONE TOUCH ULTRA MINI) w/Device KIT   1 each by Does not apply route 2 (two) times daily. Dx: E11.40   1 each   0    .   carvedilol (COREG) 25 MG tablet   TAKE 1 TABLET BY MOUTH TWICE A DAY WITH MEALS   180 tablet  1    .   Choline Fenofibrate (FENOFIBRIC ACID) 135 MG CPDR   Take 1 capsule by mouth daily.   90 capsule   1    .   colchicine 0.6 MG tablet   Take one tablet by mouth as needed for gout   30 tablet   2    .   elvitegravir-cobicistat-emtricitabine-tenofovir (GENVOYA) 150-150-200-10 MG TABS tablet   Take by mouth.            .   furosemide (LASIX) 80 MG tablet   Take 1.5 tablets (120 mg total) by mouth 2 (two) times daily.   120 tablet   0    .   gabapentin (NEURONTIN) 300 MG capsule   TAKE 1 CAPSULE BY MOUTH THREE TIMES A DAY   270 capsule   1    .   gentamicin cream (GARAMYCIN) 0.1 %   Apply 1 application topically 3 (three) times daily.   30 g   2    .   Glucosamine-Chondroit-Vit C-Mn (GLUCOSAMINE CHONDR 1500 COMPLX PO)   Take 2 tablets by mouth daily. Reported on 04/04/2016            .   glucose blood test strip   Use to test blood sugar three times daily. E11.40   300 each   3    .   hydrALAZINE (APRESOLINE) 25 MG tablet   TAKE 1 TABLET BY MOUTH THREE TIMES DAILY   270 tablet   1    .   insulin lispro  (HUMALOG KWIKPEN) 100 UNIT/ML KiwkPen   Inject 18 units subcutaneously three times daily before meals   33 mL   3    .   isosorbide mononitrate (IMDUR) 30 MG 24 hr tablet   Take 1 tablet (30 mg total) by mouth daily.   90 tablet   1    .   LANTUS SOLOSTAR 100 UNIT/ML Solostar Pen   Inject 74 units subcutaneously every evening   15 mL   6    .   latanoprost (XALATAN) 0.005 % ophthalmic solution   Place 1 drop into both eyes at bedtime. Reported on 04/04/2016            .   Naftifine HCl 2 % CREA   Apply to affected area two times a day            .   nitroGLYCERIN (NITROSTAT) 0.3 MG SL tablet   Place 1 tablet (0.3 mg total) under the tongue as needed for chest pain (not to exceed 3 tablets in one day.).   75 tablet   0    .   sodium bicarbonate 650 MG tablet   1 by mouth two times daily rx'ed by Dr.Upton       3    .   telmisartan (MICARDIS) 20 MG tablet   Take by mouth.            [name]   tiZANidine (ZANAFLEX) 4 MG tablet   Take by mouth.            .   umeclidinium-vilanterol (ANORO ELLIPTA) 62.5-25 MCG/INH AEPB   INHALE 1 PUFF BY MOUTH EVERY DAY   60 each   5        No current facility-administered medications for this visit.           REVIEW OF SYSTEMS:   '[X]'  denotes positive finding, '[ ]'  denotes negative finding  Cardiac       Comments:    Chest pain or chest pressure:            Shortness of breath upon exertion:            Short of breath when lying flat:            Irregular heart rhythm:                         Vascular            Pain in calf, thigh, or hip brought on by ambulation:   x        Pain in feet at night that wakes you up from your sleep:             Blood clot in your veins:            Leg swelling:                          Pulmonary            Oxygen at home:            Productive  cough:             Wheezing:                          Neurologic            Sudden weakness in arms or legs:             Sudden numbness in arms or legs:             Sudden onset of difficulty speaking or slurred speech:            Temporary loss of vision in one eye:             Problems with dizziness:                          Gastrointestinal            Blood in stool:             Vomited blood:                          Genitourinary            Burning when urinating:             Blood in urine:                         Psychiatric            Major depression:                          Hematologic            Bleeding problems:            Problems with blood clotting too easily:                         Skin            Rashes or ulcers:  Constitutional            Fever or chills:                  PHYSICAL EXAM:       Vitals:        07/10/18 1110   07/10/18 1111    BP:   (!) 156/85   (!) 158/87    Pulse:   66        Resp:   20        SpO2:   98%        Weight:   204 lb (92.5 kg)        Height:   '6\' 2"'  (1.88 m)              GENERAL: The patient is a well-nourished male, in no acute distress. The vital signs are documented above.  CARDIAC: There is a regular rate and rhythm.   VASCULAR:   2+ radial pulse bilateral upper extremities.  2+ brachial pulse bilateral upper extremities.  No upper extremity catheters.  No upper extremity tissue loss to either hand.  Hands are warm with normal capillary refill.  PULMONARY: There is good air exchange bilaterally without wheezing or rales.  ABDOMEN: Soft and non-tender with normal pitched bowel sounds.   MUSCULOSKELETAL: There are no major deformities or  cyanosis.  NEUROLOGIC: No focal weakness or paresthesias are detected.  SKIN: There are no ulcers or rashes noted.  PSYCHIATRIC: The patient has a normal affect.     DATA:      I independently reviewed his vein mapping which shows a decent cephalic vein in the left forearm that drains into the basilic vein in the upper arm.     Assessment/Plan:     Given that he is right-hand dominant we recommended a left upper extremity fistula.  In review of his vein mapping he does have a decent cephalic vein in the left forearm the drains in the basilic vein with normal triphasic waveforms in the brachial and radial artery.  I think we should be able to do a radiocephalic at the left wrist but if this is not feasible he may require left upper brachiobasilic and we discussed doing this in two stages.  We will get him scheduled for the next available OR day.        Marty Heck, MD  [facility]  Office: [phone]  Pager: [phone]         Marty Heck              Electronically signed by Marty Heck, MD at 07/10/2018  5:27 PM

## 2018-08-02 NOTE — Discharge Instructions (Signed)
° °  [facility] ° °Discharge Instructions ° °AV Fistula or Graft Surgery for Dialysis Access ° °Please refer to the following instructions for your post-procedure care. Your surgeon or physician assistant will discuss any changes with you. ° °Activity ° °You may drive the day following your surgery, if you are comfortable and no longer taking prescription pain medication. Resume full activity as the soreness in your incision resolves. ° °Bathing/Showering ° °You may shower after you go home. Keep your incision dry for 48 hours. Do not soak in a bathtub, hot tub, or swim until the incision heals completely. You may not shower if you have a hemodialysis catheter. ° °Incision Care ° °Clean your incision with mild soap and water after 48 hours. Pat the area dry with a clean towel. You do not need a bandage unless otherwise instructed. Do not apply any ointments or creams to your incision. You may have skin glue on your incision. Do not peel it off. It will come off on its own in about one week. Your arm may swell a bit after surgery. To reduce swelling use pillows to elevate your arm so it is above your heart. Your doctor will tell you if you need to lightly wrap your arm with an ACE bandage. ° °Diet ° °Resume your normal diet. There are not special food restrictions following this procedure. In order to heal from your surgery, it is CRITICAL to get adequate nutrition. Your body requires vitamins, minerals, and protein. Vegetables are the best source of vitamins and minerals. Vegetables also provide the perfect balance of protein. Processed food has little nutritional value, so try to avoid this. ° °Medications ° °Resume taking all of your medications. If your incision is causing pain, you may take over-the counter pain relievers such as acetaminophen (Tylenol). If you were prescribed a stronger pain medication, please be aware these medications can cause nausea and constipation. Prevent  nausea by taking the medication with a snack or meal. Avoid constipation by drinking plenty of fluids and eating foods with high amount of fiber, such as fruits, vegetables, and grains. Do not take Tylenol if you are taking prescription pain medications. ° ° ° ° °Follow up °Your surgeon may want to see you in the office following your access surgery. If so, this will be arranged at the time of your surgery. ° °Please call us immediately for any of the following conditions: ° °Increased pain, redness, drainage (pus) from your incision site °Fever of 101 degrees or higher °Severe or worsening pain at your incision site °Hand pain or numbness. ° °Reduce your risk of vascular disease: ° °Stop smoking. If you would like help, call [facility] at 1-800-QUIT-NOW (1-800-784-8669) or [place] at [phone] ° °Manage your cholesterol °Maintain a desired weight °Control your diabetes °Keep your blood pressure down ° °Dialysis ° °It will take several weeks to several months for your new dialysis access to be ready for use. Your surgeon will determine when it is OK to use it. Your nephrologist will continue to direct your dialysis. You can continue to use your Permcath until your new access is ready for use. ° °If you have any questions, please call the office at [phone]. ° °

## 2018-08-02 NOTE — Anesthesia Postprocedure Evaluation (Signed)
Anesthesia Post Note  Patient: Jacob [name]  Procedure(s) Performed: ARTERIOVENOUS (AV) FISTULA CREATION  left arm radiocephlic (Left Arm Lower)     Patient location during evaluation: PACU Anesthesia Type: MAC Level of consciousness: awake and alert Pain management: pain level controlled Vital Signs Assessment: post-procedure vital signs reviewed and stable Respiratory status: spontaneous breathing, nonlabored ventilation and respiratory function stable Cardiovascular status: stable and blood pressure returned to baseline Postop Assessment: no apparent nausea or vomiting Anesthetic complications: no    Last Vitals:  Vitals:   08/02/18 1537 08/02/18 1545  BP: (!) 167/88   Pulse: 77 74  Resp: (!) 27 (!) 23  Temp:  36.4 C  SpO2: 96% 95%    Last Pain:  Vitals:   08/02/18 1545  TempSrc:   PainSc: 0-No pain                 Jabria Loos,W. EDMOND

## 2018-08-02 NOTE — Anesthesia Preprocedure Evaluation (Signed)
Anesthesia Evaluation  Patient identified by MRN, date of birth, ID band Patient awake    Reviewed: Allergy & Precautions, NPO status , Patient's Chart, lab work & pertinent test results, reviewed documented beta blocker date and time   Airway Mallampati: II  TM Distance: >3 FB Neck ROM: Full    Dental no notable dental hx. (+) Loose, Partial Upper, Missing, Poor Dentition   Pulmonary shortness of breath and with exertion, pneumonia, resolved, Current Smoker,    Pulmonary exam normal breath sounds clear to auscultation       Cardiovascular hypertension, Pt. on medications and Pt. on home beta blockers + CAD and +CHF  Normal cardiovascular exam Rhythm:Regular Rate:Normal  Ischemic cardiomyopathy EF 24%   Neuro/Psych PSYCHIATRIC DISORDERS Diabetic peripheral neuropathy  Neuromuscular disease    GI/Hepatic GERD  Medicated and Controlled,(+)     substance abuse  cocaine use, Hepatitis -, Unspecified  Endo/Other  diabetes, Poorly Controlled, Type 2, Insulin DependentMorbid obesityHyperlipidemia  Renal/GU Renal InsufficiencyRenal disease  negative genitourinary   Musculoskeletal  (+) Arthritis , Osteoarthritis,  Left L4-5 Intraspinal Facet Cyst, Lateral Recess Stenosis, Left L5-S1 Stenosis   Abdominal   Peds  Hematology  (+) HIV, Syphilis   Anesthesia Other Findings   Reproductive/Obstetrics                             Anesthesia Physical  Anesthesia Plan  ASA: IV  Anesthesia Plan: MAC   Post-op Pain Management:    Induction: Intravenous  PONV Risk Score and Plan: 0  Airway Management Planned: Simple Face Mask  Additional Equipment:   Intra-op Plan:   Post-operative Plan:   Informed Consent: I have reviewed the patients History and Physical, chart, labs and discussed the procedure including the risks, benefits and alternatives for the proposed anesthesia with the patient or  authorized representative who has indicated his/her understanding and acceptance.   Dental advisory given  Plan Discussed with: CRNA, Anesthesiologist and Surgeon  Anesthesia Plan Comments:         Anesthesia Quick Evaluation

## 2018-08-02 NOTE — Transfer of Care (Signed)
Immediate Anesthesia Transfer of Care Note  Patient: Jacob [name]  Procedure(s) Performed: ARTERIOVENOUS (AV) FISTULA CREATION  left arm radiocephlic (Left Arm Lower)  Patient Location: PACU  Anesthesia Type:MAC  Level of Consciousness: awake, alert , oriented and patient cooperative  Airway & Oxygen Therapy: Patient Spontanous Breathing and Patient connected to face mask oxygen  Post-op Assessment: Report given to RN and Post -op Vital signs reviewed and stable  Post vital signs: Reviewed and stable  Last Vitals:  Vitals Value Taken Time  BP 148/85 08/02/2018  2:52 PM  Temp    Pulse 75 08/02/2018  2:58 PM  Resp 14 08/02/2018  2:58 PM  SpO2 98 % 08/02/2018  2:58 PM  Vitals shown include unvalidated device data.  Last Pain:  Vitals:   08/02/18 0951  TempSrc: Oral         Complications: No apparent anesthesia complications

## 2018-08-02 NOTE — Op Note (Addendum)
OPERATIVE NOTE   PROCEDURE: left radiocephaic arteriovenous fistula placement  PRE-OPERATIVE DIAGNOSIS: Stage IV CKD  POST-OPERATIVE DIAGNOSIS: Same  SURGEON: Monica Martinez, MD  ASSISTANT(S): Gerri Lins, PA  ANESTHESIA: MAC  ESTIMATED BLOOD LOSS: Minimal  FINDING(S): 1.  Cephalic vein: 2.5 mm, acceptable 2.  Radial artery: 3.0 mm, disease free 3.  Venous outflow: palpable thrill  4.  Radial flow: palpable radial pulse  SPECIMEN(S):  none  INDICATIONS:   Jacob [name] is a 67 y.o. male who presents with stage IV CKD for permanent hemodialysis access.  The patient is scheduled for left radiocephalic arteriovenous fistula placement.  The patient is aware the risks include but are not limited to: bleeding, infection, steal syndrome, nerve damage, ischemic monomelic neuropathy, failure to mature, and need for additional procedures.  The patient is aware of the risks of the procedure and elects to proceed forward.  DESCRIPTION: After full informed written consent was obtained from the patient, the patient was brought back to the operating room and placed supine upon the operating table.  Prior to induction, the patient received IV antibiotics.   After obtaining adequate anesthesia, the patient was then prepped and draped in the standard fashion for a left arm access procedure.   I turned my attention first to identifying the patient's distal cephalic vein and radial artery.  Using SonoSite guidance, the location of these vessels were marked out on the skin.   At this point, I injected local anesthetic to obtain a field block of the wrist.  In total, I injected about 10 mL of 1% lidocaine without epinephrine.  I made a longitudinal incision at the level of the wrist between the artery and vein and dissected through the subcutaneous tissue and fascia to gain exposure of the radial artery.  This was noted to be 3 mm in diameter externally.  This was dissected out proximally and  distally and controlled with vessel loops .  I then dissected out the cephalic vein.  This was noted to be 2.5 mm in diameter externally.  The distal segment of the vein was ligated with a  2-0 silk, and the vein was transected.  The proximal segment was interrogated with serial dilators.  The vein accepted up to a 3 mm dilator without any difficulty.  I then instilled the heparinized saline into the vein and clamped it.  At this point, I reset my exposure of the radial artery and placed the artery under tension proximally and distally.  I made an arteriotomy with a #11 blade, and then I extended the arteriotomy with a Potts scissor.  I injected heparinized saline proximal and distal to this arteriotomy.  The vein was then sewn to the artery in an end-to-side configuration with a running stitch of 6-0 Prolene.  Prior to completing this anastomosis, I allowed the vein and artery to backbleed.  There was no evidence of clot from any vessels.  I completed the anastomosis in the usual fashion and then released all vessel loops and clamps.    There was a palpable thrill in the venous outflow, and there was a palpable radial pulse.  At this point, I irrigated out the surgical wound.  There was no further active bleeding.  The subcutaneous tissue was reapproximated with a running stitch of 3-0 Vicryl.  The skin was then reapproximated with a running subcuticular stitch of 4-0 Monocryl.  The skin was then cleaned, dried, and reinforced with Dermabond.  The patient tolerated this procedure well.  COMPLICATIONS: None  CONDITION: Stable   Monica Martinez, MD [facility] Office: [phone] Pager: [phone]  08/02/2018, 2:28 PM

## 2018-08-02 NOTE — Anesthesia Procedure Notes (Signed)
Procedure Name: MAC Date/Time: 08/02/2018 1:38 PM Performed by: Orlie Dakin, CRNA Pre-anesthesia Checklist: Patient identified, Emergency Drugs available, Suction available and Patient being monitored Patient Re-evaluated:Patient Re-evaluated prior to induction Oxygen Delivery Method: Simple face mask Preoxygenation: Pre-oxygenation with 100% oxygen

## 2018-08-03 ENCOUNTER — Telehealth: Payer: Self-pay | Admitting: Vascular Surgery

## 2018-08-03 ENCOUNTER — Encounter (HOSPITAL_COMMUNITY): Payer: Self-pay | Admitting: Vascular Surgery

## 2018-08-03 ENCOUNTER — Telehealth: Payer: Self-pay | Admitting: *Deleted

## 2018-08-03 NOTE — Telephone Encounter (Signed)
That sounds reasonable. He takes Lantus 74u sq qd, Humalog 18u ac meals presently per med list.

## 2018-08-03 NOTE — Telephone Encounter (Signed)
Patient called and stated that he had to have fistula surgery yesterday so he was off his insulin schedule. This morning his blood sugar was 224. Rechecked and it was 213. Patient stated that he is going to keep a log of the blood sugars and if it goes any higher over the weekend he will go to Urgent Care. Patient is thinking once he gets back on schedule it will come back down. Stated that he did not want to increase his insulin anymore at this time.  [name] Patient)

## 2018-08-03 NOTE — Telephone Encounter (Signed)
sch appt spk to pt mld ltr 09/07/18 3pm Dialysis Duplex 4pm p/o MD

## 2018-08-06 NOTE — Progress Notes (Signed)
Subjective:  67 year old male presenting today for follow up evaluation of an ulceration of the left hallux. He states the wound is improving slowly. He has been applying gentamicin cream daily as directed. There are no modifying factors noted. Patient is here for further evaluation and treatment.    Past Medical History:  Diagnosis Date  . Arthritis    "all over; mostly knees and back" (02/28/2018)  . CHF (congestive heart failure) (HCC)    not on any meds  . Chronic lower back pain    stenosis  . CKD (chronic kidney disease), stage V ([place])    [name] 02/28/2018  . Coronary atherosclerosis of native coronary artery 2005   s/p surgery  . Drug abuse ([place])    hx; tested for cocaine as recently as 2/08. says he is not using drugs now - avoided defib. for this reason   . GERD (gastroesophageal reflux disease)    takes OTC meds as needed  . Glaucoma    uses eye drops daily  . Hepatitis B 1968   "tx'd w/isolation; caught it from toilet stools in gym"  . History of colon polyps    benign  . History of gout    takes Allopurinol daily as well as Colchicine-if needed (02/28/2018)  . History of kidney stones   . HTN (hypertension)    takes Coreg,Imdur.and Apresoline daily  . Human immunodeficiency virus (HIV) disease ([place]) dx'd 1995   takes Genvoya daily  . Hyperlipidemia    takes Atorvastatin daily  . Ischemic cardiomyopathy   . Muscle spasm    takes Zanaflex as needed  . Myocardial infarction ([place]) ~ 2004/2005  . Nocturia   . Peripheral neuropathy    takes gabapentin daily  . Pneumonia    "at least twice" (02/28/2018)  . Shortness of breath dyspnea    rarely but if notices it then with exertion  . Syphilis, unspecified   . Type II diabetes mellitus ([place]) 2004   Lantus daily.Average fasting blood sugar 125-199  . Wears glasses   . Wears partial dentures       Objective/Physical Exam General: The patient is alert and oriented x3 in no acute distress.  Dermatology:  Wound #1  noted to the left hallux measuring 0.5 x 0.5 x 0.1 cm (LxWxD).   To the noted ulceration(s), there is no eschar. There is a moderate amount of slough, fibrin, and necrotic tissue noted. Granulation tissue and wound base is red. There is a minimal amount of serosanguineous drainage noted. There is no exposed bone muscle-tendon ligament or joint. There is no malodor. Periwound integrity is intact. Skin is warm, dry and supple bilateral lower extremities.  Vascular: Palpable pedal pulses bilaterally. No edema or erythema noted. Capillary refill within normal limits.  Neurological: Epicritic and protective threshold diminished bilaterally.   Musculoskeletal Exam: Range of motion within normal limits to all pedal and ankle joints bilateral. Muscle strength 5/5 in all groups bilateral.   Assessment: #1 ulceration of the left hallux secondary to diabetes mellitus #2 diabetes mellitus w/ peripheral neuropathy   Plan of Care:  #1 Patient was evaluated. #2 medically necessary excisional debridement including subcutaneous tissue was performed using a tissue nipper and a chisel blade. Excisional debridement of all the necrotic nonviable tissue down to healthy bleeding viable tissue was performed with post-debridement measurements same as pre-. #3 the wound was cleansed and dry sterile dressing applied. #4 Continue applying gentamicin cream daily with a bandage.  #5 patient is to return  to clinic in 4 weeks.   Edrick Kins, DPM [facility]  Dr. Edrick Kins, [address]                                        [place], [place] [address]                Office [phone]  Fax [phone]

## 2018-08-14 ENCOUNTER — Other Ambulatory Visit: Payer: Self-pay

## 2018-08-14 DIAGNOSIS — N184 Chronic kidney disease, stage 4 (severe): Secondary | ICD-10-CM

## 2018-08-14 DIAGNOSIS — I120 Hypertensive chronic kidney disease with stage 5 chronic kidney disease or end stage renal disease: Secondary | ICD-10-CM | POA: Diagnosis not present

## 2018-08-14 DIAGNOSIS — N2581 Secondary hyperparathyroidism of renal origin: Secondary | ICD-10-CM | POA: Diagnosis not present

## 2018-08-14 DIAGNOSIS — N185 Chronic kidney disease, stage 5: Secondary | ICD-10-CM | POA: Diagnosis not present

## 2018-08-14 DIAGNOSIS — D631 Anemia in chronic kidney disease: Secondary | ICD-10-CM | POA: Diagnosis not present

## 2018-08-14 DIAGNOSIS — E1122 Type 2 diabetes mellitus with diabetic chronic kidney disease: Secondary | ICD-10-CM | POA: Diagnosis not present

## 2018-08-28 ENCOUNTER — Other Ambulatory Visit: Payer: Self-pay | Admitting: *Deleted

## 2018-08-28 MED ORDER — ONETOUCH DELICA PLUS LANCET33G MISC: 1.0000 | Freq: Three times a day (TID) | 3 refills | Status: DC

## 2018-08-28 NOTE — Telephone Encounter (Signed)
[facility] 

## 2018-08-29 ENCOUNTER — Other Ambulatory Visit: Payer: Self-pay | Admitting: *Deleted

## 2018-08-29 ENCOUNTER — Ambulatory Visit (INDEPENDENT_AMBULATORY_CARE_PROVIDER_SITE_OTHER): Payer: Medicare Other | Admitting: Podiatry

## 2018-08-29 DIAGNOSIS — B351 Tinea unguium: Secondary | ICD-10-CM

## 2018-08-29 DIAGNOSIS — L989 Disorder of the skin and subcutaneous tissue, unspecified: Secondary | ICD-10-CM

## 2018-08-29 DIAGNOSIS — E0842 Diabetes mellitus due to underlying condition with diabetic polyneuropathy: Secondary | ICD-10-CM

## 2018-08-29 DIAGNOSIS — M79676 Pain in unspecified toe(s): Secondary | ICD-10-CM

## 2018-08-29 DIAGNOSIS — L97522 Non-pressure chronic ulcer of other part of left foot with fat layer exposed: Secondary | ICD-10-CM

## 2018-08-29 MED ORDER — ONETOUCH DELICA PLUS LANCET33G MISC: 1.0000 | Freq: Three times a day (TID) | 3 refills | Status: DC

## 2018-08-29 NOTE — Telephone Encounter (Signed)
Patient called and stated that [facility] did not receive the Rx for Lancets on 11/5.Resent.

## 2018-08-30 ENCOUNTER — Encounter: Payer: Self-pay | Admitting: Nurse Practitioner

## 2018-08-30 ENCOUNTER — Ambulatory Visit (INDEPENDENT_AMBULATORY_CARE_PROVIDER_SITE_OTHER): Payer: Medicare Other | Admitting: Nurse Practitioner

## 2018-08-30 VITALS — BP 132/78 | HR 67 | Temp 97.9°F | Ht 74.0 in | Wt 202.2 lb

## 2018-08-30 DIAGNOSIS — Z720 Tobacco use: Secondary | ICD-10-CM

## 2018-08-30 DIAGNOSIS — D638 Anemia in other chronic diseases classified elsewhere: Secondary | ICD-10-CM

## 2018-08-30 DIAGNOSIS — J449 Chronic obstructive pulmonary disease, unspecified: Secondary | ICD-10-CM

## 2018-08-30 DIAGNOSIS — B20 Human immunodeficiency virus [HIV] disease: Secondary | ICD-10-CM

## 2018-08-30 DIAGNOSIS — M109 Gout, unspecified: Secondary | ICD-10-CM

## 2018-08-30 DIAGNOSIS — I5042 Chronic combined systolic (congestive) and diastolic (congestive) heart failure: Secondary | ICD-10-CM

## 2018-08-30 DIAGNOSIS — E1169 Type 2 diabetes mellitus with other specified complication: Secondary | ICD-10-CM | POA: Diagnosis not present

## 2018-08-30 DIAGNOSIS — Z794 Long term (current) use of insulin: Secondary | ICD-10-CM | POA: Diagnosis not present

## 2018-08-30 DIAGNOSIS — N184 Chronic kidney disease, stage 4 (severe): Secondary | ICD-10-CM

## 2018-08-30 DIAGNOSIS — I10 Essential (primary) hypertension: Secondary | ICD-10-CM

## 2018-08-30 DIAGNOSIS — E1140 Type 2 diabetes mellitus with diabetic neuropathy, unspecified: Secondary | ICD-10-CM | POA: Diagnosis not present

## 2018-08-30 DIAGNOSIS — E785 Hyperlipidemia, unspecified: Secondary | ICD-10-CM

## 2018-08-30 DIAGNOSIS — E1122 Type 2 diabetes mellitus with diabetic chronic kidney disease: Secondary | ICD-10-CM

## 2018-08-30 MED ORDER — ATORVASTATIN CALCIUM 40 MG PO TABS: 20.0000 mg | ORAL_TABLET | Freq: Every day | ORAL | 1 refills | Status: DC

## 2018-08-30 NOTE — Patient Instructions (Addendum)
Increase lipitor to 40 mg by mouth daily  (can take 2 of the 20 mg by mouth daily until you run out)  Follow up in 3 months with lab work before visit- to check cholesterol and liver enzymes

## 2018-08-30 NOTE — Progress Notes (Signed)
Careteam: Patient Care Team: [name], NP as PCP - General (Geriatric Medicine) [name], MD as PCP - Infectious Diseases (Infectious Diseases) [name], MD as Consulting Physician (Ophthalmology) [name], MD as Consulting Physician (Cardiology) [name], MD as Consulting Physician (Infectious Diseases) Gardiner Barefoot, DPM as Consulting Physician (Podiatry) Madelon Lips, MD as Consulting Physician (Nephrology)  Advanced Directive information Does Patient Have a Medical Advance Directive?: Yes, Type of Advance Directive: [place];Living will  Allergies  Allergen Reactions  . Amphetamines Other (See Comments)    Pt is unaware of this -     Chief Complaint  Patient presents with  . Medical Management of Chronic Issues    Pt is being seen for a 3 month routine visit. Pt has no concerns today.      HPI: Patient is a 67 y.o. male seen in the office today for routine follow up.  In the process of having dental surgery. Having multiple teeth removed.   Tob abuse - ongoing smoking but down to < 1/2 ppd.    HIV - stable on HAART tx (genvoya).  Followed by ID Dr [name] annually  HTN/CHF - stable on hydralazine, coreg, imdur. Followed by Dr [name]   DM - taking 74 units in the morning, and taking 18 Humalog with meals. No low BS reactions. Numbness/tingling in feet occasionally and cramps in ankles and calves. Followed by podiatry. No ulcerations but has calluses. A1c 7.6%. He is not on ARB due to AKI and hyperkalemia. He has an eye specialist. He has diabetic preulcerative calluses and bunions and is followed by podiatry.  Hyperlipidemia - uncontrolled. Takes lipitor, fenofibrate. LDL 112,TG 436; HDL 31  CKD  - stage 4.  Followed by nephrology Dr [name]. Takes NaHco3 tabs, has fistula placed. Having monthly blood work. Not on HD yet.    Review of Systems:  Review of Systems  Constitutional: Negative for chills,  fever and malaise/fatigue.  HENT: Negative for congestion.   Respiratory: Negative for cough and shortness of breath.   Cardiovascular: Negative for chest pain, palpitations and leg swelling.  Gastrointestinal: Negative for abdominal pain, constipation and diarrhea.  Genitourinary: Negative for dysuria and urgency.  Musculoskeletal: Positive for myalgias.  Skin: Negative.   Neurological: Negative for dizziness and headaches.    Past Medical History:  Diagnosis Date  . Arthritis    "all over; mostly knees and back" (02/28/2018)  . CHF (congestive heart failure) (HCC)    not on any meds  . Chronic lower back pain    stenosis  . CKD (chronic kidney disease), stage V ([place])    [name] 02/28/2018  . Coronary atherosclerosis of native coronary artery 2005   s/p surgery  . Drug abuse ([place])    hx; tested for cocaine as recently as 2/08. says he is not using drugs now - avoided defib. for this reason   . GERD (gastroesophageal reflux disease)    takes OTC meds as needed  . Glaucoma    uses eye drops daily  . Hepatitis B 1968   "tx'd w/isolation; caught it from toilet stools in gym"  . History of colon polyps    benign  . History of gout    takes Allopurinol daily as well as Colchicine-if needed (02/28/2018)  . History of kidney stones   . HTN (hypertension)    takes Coreg,Imdur.and Apresoline daily  . Human immunodeficiency virus (HIV) disease ([place]) dx'd 1995   takes Genvoya daily  .  Hyperlipidemia    takes Atorvastatin daily  . Ischemic cardiomyopathy   . Muscle spasm    takes Zanaflex as needed  . Myocardial infarction ([place]) ~ 2004/2005  . Nocturia   . Peripheral neuropathy    takes gabapentin daily  . Pneumonia    "at least twice" (02/28/2018)  . Shortness of breath dyspnea    rarely but if notices it then with exertion  . Syphilis, unspecified   . Type II diabetes mellitus ([place]) 2004   Lantus daily.Average fasting blood sugar 125-199  . Wears glasses   . Wears partial  dentures    Past Surgical History:  Procedure Laterality Date  . AV FISTULA PLACEMENT Left 08/02/2018   Procedure: ARTERIOVENOUS (AV) FISTULA CREATION  left arm radiocephlic;  Surgeon: Marty Heck, MD;  Location: [facility];  Service: Vascular;  Laterality: Left;  . BACK SURGERY    . CARDIAC CATHETERIZATION  10/2002; 12/19/2004   [name] 03/08/2011  . COLONOSCOPY  2013   Dr.John Henrene Pastor   . CORONARY ARTERY BYPASS GRAFT  02/24/2003   CABG X2/notes 03/08/2011  . FRACTURE SURGERY    . INTERTROCHANTERIC HIP FRACTURE SURGERY Left 11/2006   [name] 03/08/2011  . LAPAROSCOPIC CHOLECYSTECTOMY  05/2006  . LUMBAR LAMINECTOMY/DECOMPRESSION MICRODISCECTOMY N/A 02/29/2016   Procedure: Left L4-5 Lateral Recess Decompression, Removal Extradural Intraspinal Facet Cyst;  Surgeon: Marybelle Killings, MD;  Location: [facility];  Service: Orthopedics;  Laterality: N/A;  . MULTIPLE TOOTH EXTRACTIONS    . ORIF MANDIBULAR FRACTURE Left 08/13/2004   ORIF of left body fracture mandible with KLS Martin 2.3-mm six hole/notes 03/08/2011   Social History:   reports that he has been smoking cigarettes. He has a 21.50 pack-year smoking history. He has never used smokeless tobacco. He reports that he drinks alcohol. He reports that he does not use drugs.  Family History  Problem Relation Age of Onset  . Heart failure Father   . Hypertension Father   . Diabetes Brother   . Heart attack Brother   . Alzheimer's disease Mother   . Stroke Sister   . Diabetes Sister   . Alzheimer's disease Sister   . Hypertension Brother   . Diabetes Brother   . Drug abuse Brother   . Colon cancer Neg Hx     Medications: Patient's Medications  New Prescriptions   No medications on file  Previous Medications   ACETAMINOPHEN (TYLENOL) 500 MG TABLET    Take 500 mg by mouth every 6 (six) hours as needed for moderate pain.   ALLOPURINOL (ZYLOPRIM) 100 MG TABLET    Take 1 tablet (100 mg total) by mouth daily.   ASPIRIN EC 81 MG TABLET    Take 81  mg by mouth daily.   ATORVASTATIN (LIPITOR) 20 MG TABLET    Take 1 tablet (20 mg total) by mouth daily.   B-D UF III MINI PEN NEEDLES 31G X 5 MM MISC    use four times a day   BICTEGRAVIR-EMTRICITABINE-TENOFOVIR AF (BIKTARVY) 50-200-25 MG TABS TABLET    Take 1 tablet by mouth daily.   BLOOD GLUCOSE MONITORING SUPPL (ONE TOUCH ULTRA MINI) W/DEVICE KIT    1 each by Does not apply route 2 (two) times daily. Dx: E11.40   CARVEDILOL (COREG) 25 MG TABLET    TAKE 1 TABLET BY MOUTH TWICE A DAY WITH MEALS   CHOLINE FENOFIBRATE (FENOFIBRIC ACID) 135 MG CPDR    Take 1 capsule by mouth daily.   COLCHICINE 0.6 MG TABLET  Take one tablet by mouth as needed for gout   FUROSEMIDE (LASIX) 80 MG TABLET    Take 1.5 tablets (120 mg total) by mouth 2 (two) times daily.   GABAPENTIN (NEURONTIN) 300 MG CAPSULE    TAKE 1 CAPSULE BY MOUTH THREE TIMES A DAY   GENTAMICIN CREAM (GARAMYCIN) 0.1 %    Apply 1 application topically 3 (three) times daily.   GLUCOSAMINE-CHONDROIT-VIT C-MN (GLUCOSAMINE CHONDR 1500 COMPLX PO)    Take 2 tablets by mouth daily.    GLUCOSE BLOOD TEST STRIP    Use to test blood sugar three times daily. E11.40   HYDRALAZINE (APRESOLINE) 25 MG TABLET    TAKE 1 TABLET BY MOUTH THREE TIMES DAILY   INSULIN LISPRO (HUMALOG KWIKPEN) 100 UNIT/ML KIWKPEN    Inject 18 units subcutaneously three times daily before meals   ISOSORBIDE MONONITRATE (IMDUR) 30 MG 24 HR TABLET    Take 1 tablet (30 mg total) by mouth daily.   LANCETS (ONETOUCH DELICA PLUS [id number]) MISC    1 each by Does not apply route 3 (three) times daily. Use to test blood sugar three times daily. Dx: E11.40   LANTUS SOLOSTAR 100 UNIT/ML SOLOSTAR PEN    Inject 74 units subcutaneously every evening   LATANOPROST (XALATAN) 0.005 % OPHTHALMIC SOLUTION    Place 1 drop into both eyes at bedtime. Reported on 04/04/2016   NITROGLYCERIN (NITROSTAT) 0.3 MG SL TABLET    Place 1 tablet (0.3 mg total) under the tongue as needed for chest pain (not to exceed 3  tablets in one day.).   SODIUM BICARBONATE 650 MG TABLET    Take 650 mg by mouth 2 (two) times daily.    UMECLIDINIUM-VILANTEROL (ANORO ELLIPTA) 62.5-25 MCG/INH AEPB    INHALE 1 PUFF BY MOUTH EVERY DAY  Modified Medications   No medications on file  Discontinued Medications   OXYCODONE-ACETAMINOPHEN (PERCOCET/ROXICET) 5-325 MG TABLET    Take 1 tablet by mouth every 6 (six) hours as needed.     Physical Exam:  Vitals:   08/30/18 0959  BP: 132/78  Pulse: 67  Temp: 97.9 F (36.6 C)  TempSrc: Oral  SpO2: 98%  Weight: 202 lb 3.2 oz (91.7 kg)  Height: '6\' 2"'  (1.88 m)   Body mass index is 25.96 kg/m.  Physical Exam  Constitutional: He is oriented to person, place, and time. He appears well-developed and well-nourished.  HENT:  Mouth/Throat: Oropharynx is clear and moist.  MMM; no oral thrush  Eyes: Pupils are equal, round, and reactive to light. No scleral icterus.  Neck: Neck supple. Carotid bruit is not present. No thyromegaly present.  Cardiovascular: Normal rate, regular rhythm and intact distal pulses. Exam reveals no gallop and no friction rub.  Murmur heard. Pulmonary/Chest: Effort normal and breath sounds normal. He has no wheezes. He has no rales. He exhibits no tenderness.  Abdominal: Soft. Bowel sounds are normal. He exhibits no distension, no abdominal bruit, no pulsatile midline mass and no mass. There is no hepatomegaly. There is no tenderness. There is no rebound and no guarding.  Musculoskeletal: He exhibits no edema.  Lymphadenopathy:    He has no cervical adenopathy.  Neurological: He is alert and oriented to person, place, and time. He has normal reflexes.  Skin: Skin is warm and dry. No rash noted.  Psychiatric: He has a normal mood and affect. His behavior is normal. Judgment and thought content normal.    Labs reviewed: Basic Metabolic Panel: Recent Labs  02/28/18 1640 03/01/18 0300 03/05/18 1443 03/13/18 1034 04/25/18 1227 08/02/18 1008  NA 137  137 138 138 138 147*  K 6.8* 5.4* 5.1 5.1 4.5 4.5  CL 106 106 107 112* 107  --   CO2 '22 23 21 ' 17* 19*  --   GLUCOSE 292* 234* 89 165* 112* 130*  BUN 60* 66* 78* 78* 79*  --   CREATININE 4.70* 4.22* 4.25* 4.11* 5.14*  --   CALCIUM 9.4 9.0 8.9 9.3 9.0  --   PHOS 3.5 3.7  --   --   --   --    Liver Function Tests: Recent Labs    01/08/18 0827 02/27/18 1600 02/28/18 0546 02/28/18 1640 03/01/18 0300  AST 25 24 37  --   --   ALT '19 16 25  ' --   --   ALKPHOS  --   --  36*  --   --   BILITOT 0.4 0.3 1.0  --   --   PROT 8.3* 7.7 7.4  --   --   ALBUMIN  --   --  3.4* 3.5 3.2*   No results for input(s): LIPASE, AMYLASE in the last 8760 hours. No results for input(s): AMMONIA in the last 8760 hours. CBC: Recent Labs    02/27/18 1600 02/28/18 0546 03/01/18 0300 03/13/18 1034 08/02/18 1008  WBC 5.7 10.5 10.1 6.2  --   NEUTROABS 2,491  --   --  3,150  --   HGB 12.8* 12.6* 11.7* 12.8* 13.3  HCT 36.8* 38.4* 35.2* 36.9* 39.0  MCV 95.8 99.7 97.8 95.1  --   PLT 186 167 173 222  --    Lipid Panel: Recent Labs    10/09/17 0821 01/08/18 0827 05/30/18 1007  CHOL 211* 170 178  HDL 39* 31* 29*  LDLCALC 130*  --  112*  TRIG 297* 436* 245*  CHOLHDL 5.4* 5.5* 6.1*   TSH: No results for input(s): TSH in the last 8760 hours. A1C: Lab Results  Component Value Date   HGBA1C 7.6 (H) 05/30/2018     Assessment/Plan 1. Type 2 diabetes mellitus with diabetic neuropathy, with long-term current use of insulin ([place]) Taking medication as prescribed with dietary modifications, will follow up at this time. Monitoring feet daily and following with ophthalmologist.  - Hemoglobin A1c  2. Human immunodeficiency virus (HIV) disease ([place]) Stable, following ID routinely  3. Tobacco abuse encouraged cessation  4. Anemia of chronic disease Stable on most recent lab, pt with CKD stage 4 followed by nephrologist   5. Hyperlipidemia associated with type 2 diabetes mellitus ([place]) -not at goal, to  increase lipitor to 40 mg daily.  - atorvastatin (LIPITOR) 40 MG tablet; Take 0.5 tablets (20 mg total) by mouth daily.  Dispense: 90 tablet; Refill: 1 - Lipid Panel; Future - Hepatic Function Panel; Future  6. Essential hypertension Stable, cont current medication and dietary modifications.   7. CKD stage 4 due to type 2 diabetes mellitus ([place]) Has fistula placed but not on HD yet, following closely with nephrologist at this time  8. Gout, unspecified cause, unspecified chronicity, unspecified site No recent flare.  9. Chronic obstructive pulmonary disease, unspecified COPD type ([place]) Stable on current regimen, no recent exacerbations.   10. Chronic combined systolic and diastolic congestive heart failure (HCC) Euvolemic, continue current regimen.   Next appt:  Jacob [name]. [place], [facility] [phone]

## 2018-08-31 LAB — HEMOGLOBIN A1C
EAG (MMOL/L): 8.2 (calc)
Hgb A1c MFr Bld: 6.8 % of total Hgb — ABNORMAL HIGH (ref <5.7)
Mean Plasma Glucose: 148 (calc)

## 2018-09-02 NOTE — Progress Notes (Signed)
Subjective: Patient is a 67 y.o. male presenting to the office today for follow up evaluation of an ulceration of the left hallux. He states he is doing well and feels as if the wound has healed. He has been applying Gentamicin cream for treatment. He denies any modifying factors.  He also complains of painful callus lesions to the bilateral feet that have been present for the last few weeks. He has not done anything for treatment at home and states walking increases the pain.   Patient also complains of elongated, thickened nails that cause pain while ambulating in shoes. He is unable to trim his own nails. Patient presents today for further treatment and evaluation.  Past Medical History:  Diagnosis Date  . Arthritis    "all over; mostly knees and back" (02/28/2018)  . CHF (congestive heart failure) (HCC)    not on any meds  . Chronic lower back pain    stenosis  . CKD (chronic kidney disease), stage V ([place])    [name] 02/28/2018  . Coronary atherosclerosis of native coronary artery 2005   s/p surgery  . Drug abuse ([place])    hx; tested for cocaine as recently as 2/08. says he is not using drugs now - avoided defib. for this reason   . GERD (gastroesophageal reflux disease)    takes OTC meds as needed  . Glaucoma    uses eye drops daily  . Hepatitis B 1968   "tx'd w/isolation; caught it from toilet stools in gym"  . History of colon polyps    benign  . History of gout    takes Allopurinol daily as well as Colchicine-if needed (02/28/2018)  . History of kidney stones   . HTN (hypertension)    takes Coreg,Imdur.and Apresoline daily  . Human immunodeficiency virus (HIV) disease ([place]) dx'd 1995   takes Genvoya daily  . Hyperlipidemia    takes Atorvastatin daily  . Ischemic cardiomyopathy   . Muscle spasm    takes Zanaflex as needed  . Myocardial infarction ([place]) ~ 2004/2005  . Nocturia   . Peripheral neuropathy    takes gabapentin daily  . Pneumonia    "at least twice"  (02/28/2018)  . Shortness of breath dyspnea    rarely but if notices it then with exertion  . Syphilis, unspecified   . Type II diabetes mellitus ([place]) 2004   Lantus daily.Average fasting blood sugar 125-199  . Wears glasses   . Wears partial dentures     Objective:  Physical Exam General: Alert and oriented x3 in no acute distress  Dermatology: Wound noted to the left hallux has healed. Complete re-epithelialization has occurred. No drainage noted.   Hyperkeratotic lesions present on the bilateral feet x 3. Pain on palpation with a central nucleated core noted. Skin is warm, dry and supple bilateral lower extremities. Negative for open lesions or macerations. Nails are tender, long, thickened and dystrophic with subungual debris, consistent with onychomycosis, 1-5 bilateral. No signs of infection noted.  Vascular: Palpable pedal pulses bilaterally. No edema or erythema noted. Capillary refill within normal limits.  Neurological: Epicritic and protective threshold diminished bilaterally.   Musculoskeletal Exam: Pain on palpation at the keratotic lesions noted. Range of motion within normal limits bilateral. Muscle strength 5/5 in all groups bilateral.  Assessment: 1. Onychodystrophic nails 1-5 bilateral with hyperkeratosis of nails.  2. Onychomycosis of nail due to dermatophyte bilateral 3. Pre-ulcerative callus lesions x 3 noted to the bilateral feet 4. Ulceration of the  left hallux - healed    Plan of Care:  1. Patient evaluated. 2. Excisional debridement of keratoic lesion using a chisel blade was performed without incident.  3. Dressed with light dressing. 4. Mechanical debridement of nails 1-5 bilaterally performed using a nail nipper. Filed with dremel without incident.  5. Patient is to return to the clinic in 3 months.   Edrick Kins, DPM [facility]  Dr. Edrick Kins, [address]                                        [place], [place]  [address]                Office [phone]  Fax [phone]

## 2018-09-07 ENCOUNTER — Other Ambulatory Visit: Payer: Self-pay

## 2018-09-07 ENCOUNTER — Ambulatory Visit (HOSPITAL_COMMUNITY)
Admission: RE | Admit: 2018-09-07 | Discharge: 2018-09-07 | Disposition: A | Discharge status: Home / Self Care | Payer: Medicare Other | Source: Ambulatory Visit | Attending: Family | Admitting: Family

## 2018-09-07 ENCOUNTER — Ambulatory Visit (INDEPENDENT_AMBULATORY_CARE_PROVIDER_SITE_OTHER): Payer: Self-pay | Admitting: Physician Assistant

## 2018-09-07 VITALS — BP 181/90 | HR 70 | Temp 97.2°F | Resp 18 | Ht 74.0 in | Wt 202.0 lb

## 2018-09-07 DIAGNOSIS — N184 Chronic kidney disease, stage 4 (severe): Secondary | ICD-10-CM | POA: Diagnosis not present

## 2018-09-07 DIAGNOSIS — E1122 Type 2 diabetes mellitus with diabetic chronic kidney disease: Secondary | ICD-10-CM

## 2018-09-07 NOTE — Progress Notes (Signed)
POST OPERATIVE OFFICE NOTE    CC:  F/u for surgery  HPI:  This is a 67 y.o. male who is s/p  left radiocephaic arteriovenous fistula creation on 08/02/2018 by Dr. Carlis Abbott.  She is here today for f/u duplex to check the maturity of the fistula.  She denise pain, loss of sensation or loss of motor in the left UE.    Allergies  Allergen Reactions  . Amphetamines Other (See Comments)    Pt is unaware of this -     Current Outpatient Medications  Medication Sig Dispense Refill  . acetaminophen (TYLENOL) 500 MG tablet Take 500 mg by mouth every 6 (six) hours as needed for moderate pain.    [name] allopurinol (ZYLOPRIM) 100 MG tablet Take 1 tablet (100 mg total) by mouth daily. 90 tablet 1  . aspirin EC 81 MG tablet Take 81 mg by mouth daily.    [name] atorvastatin (LIPITOR) 40 MG tablet Take 0.5 tablets (20 mg total) by mouth daily. 90 tablet 1  . B-D UF III MINI PEN NEEDLES 31G X 5 MM MISC use four times a day 100 each 11  . bictegravir-emtricitabine-tenofovir AF (BIKTARVY) 50-200-25 MG TABS tablet Take 1 tablet by mouth daily. 30 tablet 11  . Blood Glucose Monitoring Suppl (ONE TOUCH ULTRA MINI) w/Device KIT 1 each by Does not apply route 2 (two) times daily. Dx: E11.40 1 each 0  . carvedilol (COREG) 25 MG tablet TAKE 1 TABLET BY MOUTH TWICE A DAY WITH MEALS 180 tablet 2  . Choline Fenofibrate (FENOFIBRIC ACID) 135 MG CPDR Take 1 capsule by mouth daily. 90 capsule 1  . colchicine 0.6 MG tablet Take one tablet by mouth as needed for gout 30 tablet 2  . furosemide (LASIX) 80 MG tablet Take 1.5 tablets (120 mg total) by mouth 2 (two) times daily. 120 tablet 0  . gabapentin (NEURONTIN) 300 MG capsule TAKE 1 CAPSULE BY MOUTH THREE TIMES A DAY 270 capsule 1  . gentamicin cream (GARAMYCIN) 0.1 % Apply 1 application topically 3 (three) times daily. 30 g 2  . Glucosamine-Chondroit-Vit C-Mn (GLUCOSAMINE CHONDR 1500 COMPLX PO) Take 2 tablets by mouth daily.     [name] glucose blood test strip Use to test blood sugar  three times daily. E11.40 300 each 3  . hydrALAZINE (APRESOLINE) 25 MG tablet TAKE 1 TABLET BY MOUTH THREE TIMES DAILY 270 tablet 0  . insulin lispro (HUMALOG KWIKPEN) 100 UNIT/ML KiwkPen Inject 18 units subcutaneously three times daily before meals 33 mL 3  . isosorbide mononitrate (IMDUR) 30 MG 24 hr tablet Take 1 tablet (30 mg total) by mouth daily. 90 tablet 1  . Lancets (ONETOUCH DELICA PLUS [id number]) MISC 1 each by Does not apply route 3 (three) times daily. Use to test blood sugar three times daily. Dx: E11.40 300 each 3  . LANTUS SOLOSTAR 100 UNIT/ML Solostar Pen Inject 74 units subcutaneously every evening 15 mL 6  . latanoprost (XALATAN) 0.005 % ophthalmic solution Place 1 drop into both eyes at bedtime. Reported on 04/04/2016    . nitroGLYCERIN (NITROSTAT) 0.3 MG SL tablet Place 1 tablet (0.3 mg total) under the tongue as needed for chest pain (not to exceed 3 tablets in one day.). 75 tablet 0  . sodium bicarbonate 650 MG tablet Take 650 mg by mouth 2 (two) times daily.   3  . umeclidinium-vilanterol (ANORO ELLIPTA) 62.5-25 MCG/INH AEPB INHALE 1 PUFF BY MOUTH EVERY DAY 60 each 5   No current facility-administered  medications for this visit.      ROS:  See HPI  Physical Exam:  Vitals:   09/07/18 1552  BP: (!) 181/90  Pulse: 70  Resp: 18  Temp: (!) 97.2 F (36.2 C)  SpO2: 98%    Incision:  Well healed left wrist incision Extremities:  Palpable thrill throughout the fistula with good visibility under the skin surface.    Fistula duplex shows depth < 0.27, bu the size of the fistula is small < 0.33 cm    Assessment/Plan:  This is a 67 y.o. male with CKD stage 4/5 who is s/p: Left AV radiocephalic fistula  We will have him come back in 4-5 weeks for repeat fistula duplex.  If it is failing to mature at that time we will plan to converted the fistula to a Braciocephalic fistula.  He is currently not on HD.   Roxy Horseman PA-C [facility] [phone]

## 2018-09-12 ENCOUNTER — Other Ambulatory Visit: Payer: Self-pay | Admitting: Nurse Practitioner

## 2018-09-12 ENCOUNTER — Telehealth: Payer: Self-pay | Admitting: *Deleted

## 2018-09-12 MED ORDER — ATORVASTATIN CALCIUM 40 MG PO TABS: 40.0000 mg | ORAL_TABLET | Freq: Every day | ORAL | 1 refills | Status: DC

## 2018-09-12 NOTE — Telephone Encounter (Signed)
Oh yes it should be 91, sorry for the confusion.

## 2018-09-12 NOTE — Telephone Encounter (Signed)
Medication list updated and Rx faxed to pharmacy. Patient notified.

## 2018-09-12 NOTE — Telephone Encounter (Signed)
Patient called and stated that he was confused with his Rx. Stated that [name] told him to increase his Lipitor to 40mg  but the Rx she sent in to the pharmacy was for 40mg  1/2 tablet daily. Patient wants to know if he should be continuing the 20mg  or the 40mg . Please Advise.     Per [name] OV Note 11/7 :  5. Hyperlipidemia associated with type 2 diabetes mellitus ([place]) -not at goal, to increase lipitor to 40 mg daily.  - atorvastatin (LIPITOR) 40 MG tablet; Take 0.5 tablets (20 mg total) by mouth daily.  Dispense: 90 tablet; Refill: 1 - Lipid Panel; Future - Hepatic Function Panel; Future

## 2018-09-14 DIAGNOSIS — I5042 Chronic combined systolic (congestive) and diastolic (congestive) heart failure: Secondary | ICD-10-CM | POA: Diagnosis not present

## 2018-09-14 DIAGNOSIS — I120 Hypertensive chronic kidney disease with stage 5 chronic kidney disease or end stage renal disease: Secondary | ICD-10-CM | POA: Diagnosis not present

## 2018-09-14 DIAGNOSIS — N2581 Secondary hyperparathyroidism of renal origin: Secondary | ICD-10-CM | POA: Diagnosis not present

## 2018-09-14 DIAGNOSIS — N185 Chronic kidney disease, stage 5: Secondary | ICD-10-CM | POA: Diagnosis not present

## 2018-10-01 ENCOUNTER — Other Ambulatory Visit: Payer: Self-pay

## 2018-10-01 DIAGNOSIS — N184 Chronic kidney disease, stage 4 (severe): Secondary | ICD-10-CM

## 2018-10-08 ENCOUNTER — Other Ambulatory Visit: Payer: Self-pay

## 2018-10-08 DIAGNOSIS — I10 Essential (primary) hypertension: Secondary | ICD-10-CM

## 2018-10-08 DIAGNOSIS — I5042 Chronic combined systolic (congestive) and diastolic (congestive) heart failure: Secondary | ICD-10-CM

## 2018-10-08 MED ORDER — HYDRALAZINE HCL 25 MG PO TABS: 25.0000 mg | ORAL_TABLET | Freq: Three times a day (TID) | ORAL | 0 refills | Status: DC

## 2018-10-08 MED ORDER — ISOSORBIDE MONONITRATE ER 30 MG PO TB24: 30.0000 mg | ORAL_TABLET | Freq: Every day | ORAL | 0 refills | Status: DC

## 2018-10-10 ENCOUNTER — Ambulatory Visit: Payer: Medicare Other

## 2018-10-10 ENCOUNTER — Encounter (HOSPITAL_COMMUNITY): Payer: Medicare Other

## 2018-10-19 NOTE — Progress Notes (Signed)
POST OPERATIVE OFFICE NOTE    CC:  F/u for surgery  HPI:  This is a 67 y.o. male who is s/p left RC AVF by Dr. Carlis Abbott on 08/02/18.  He returned to the office on 09/07/18 for follow up duplex and at that time, the fistula was not maturing.  He was scheduled to return in another 4-5 weeks for repeat duplex.  If it is not maturing, the plan was to convert to left brachiocephalic AVF.    He states that he will have an occasional pain in his left hand at night but it goes away pretty quickly.    Allergies  Allergen Reactions  . Amphetamines Other (See Comments)    Pt is unaware of this -     Current Outpatient Medications  Medication Sig Dispense Refill  . acetaminophen (TYLENOL) 500 MG tablet Take 500 mg by mouth every 6 (six) hours as needed for moderate pain.    [name] allopurinol (ZYLOPRIM) 100 MG tablet Take 1 tablet (100 mg total) by mouth daily. 90 tablet 1  . aspirin EC 81 MG tablet Take 81 mg by mouth daily.    [name] atorvastatin (LIPITOR) 40 MG tablet Take 1 tablet (40 mg total) by mouth daily. 90 tablet 1  . B-D UF III MINI PEN NEEDLES 31G X 5 MM MISC use four times a day 100 each 11  . bictegravir-emtricitabine-tenofovir AF (BIKTARVY) 50-200-25 MG TABS tablet Take 1 tablet by mouth daily. 30 tablet 11  . Blood Glucose Monitoring Suppl (ONE TOUCH ULTRA MINI) w/Device KIT 1 each by Does not apply route 2 (two) times daily. Dx: E11.40 1 each 0  . carvedilol (COREG) 25 MG tablet TAKE 1 TABLET BY MOUTH TWICE A DAY WITH MEALS 180 tablet 2  . Choline Fenofibrate (FENOFIBRIC ACID) 135 MG CPDR Take 1 capsule by mouth daily. 90 capsule 1  . colchicine 0.6 MG tablet Take one tablet by mouth as needed for gout 30 tablet 2  . furosemide (LASIX) 80 MG tablet Take 1.5 tablets (120 mg total) by mouth 2 (two) times daily. 120 tablet 0  . gabapentin (NEURONTIN) 300 MG capsule TAKE 1 CAPSULE BY MOUTH THREE TIMES A DAY 270 capsule 1  . gentamicin cream (GARAMYCIN) 0.1 % Apply 1 application topically 3  (three) times daily. 30 g 2  . Glucosamine-Chondroit-Vit C-Mn (GLUCOSAMINE CHONDR 1500 COMPLX PO) Take 2 tablets by mouth daily.     [name] glucose blood test strip Use to test blood sugar three times daily. E11.40 300 each 3  . hydrALAZINE (APRESOLINE) 25 MG tablet Take 1 tablet (25 mg total) by mouth 3 (three) times daily. 270 tablet 0  . insulin lispro (HUMALOG KWIKPEN) 100 UNIT/ML KiwkPen Inject 18 units subcutaneously three times daily before meals 33 mL 3  . isosorbide mononitrate (IMDUR) 30 MG 24 hr tablet Take 1 tablet (30 mg total) by mouth daily. 90 tablet 0  . Lancets (ONETOUCH DELICA PLUS [id number]) MISC 1 each by Does not apply route 3 (three) times daily. Use to test blood sugar three times daily. Dx: E11.40 300 each 3  . LANTUS SOLOSTAR 100 UNIT/ML Solostar Pen Inject 74 units subcutaneously every evening 15 mL 6  . latanoprost (XALATAN) 0.005 % ophthalmic solution Place 1 drop into both eyes at bedtime. Reported on 04/04/2016    . nitroGLYCERIN (NITROSTAT) 0.3 MG SL tablet Place 1 tablet (0.3 mg total) under the tongue as needed for chest pain (not to exceed 3 tablets in one day.). 75  tablet 0  . sodium bicarbonate 650 MG tablet Take 650 mg by mouth 2 (two) times daily.   3  . umeclidinium-vilanterol (ANORO ELLIPTA) 62.5-25 MCG/INH AEPB INHALE 1 PUFF BY MOUTH EVERY DAY 60 each 5   No current facility-administered medications for this visit.      ROS:  See HPI  Physical Exam:  Today's Vitals   10/22/18 1513  BP: (!) 143/76  Pulse: 83  Resp: 20  Temp: 98.1 F (36.7 C)  SpO2: 97%  Weight: 202 lb (91.6 kg)  Height: _0  (1.854 m)  PainSc: 7    Body mass index is 26.65 kg/m.  Incision:  Well healed Extremities:  Easily palpable left radial pulse; strong grip left hand; the fistula is easily palpable with good thrill.  There are several obvious competing branches present.   Dialysis duplex 10/22/18: +--------------------+----------+-----------------+--------+ AVF                  PSV (cm/s)Flow Vol (mL/min)Comments +--------------------+----------+-----------------+--------+ Native artery inflow   196                              +--------------------+----------+-----------------+--------+ AVF Anastomosis        188                              +--------------------+----------+-----------------+--------+    +------------+----------+-------------+----------+-----------------------------+ OUTFLOW VEINPSV (cm/s)Diameter (cm)Depth (cm)          Describe            +------------+----------+-------------+----------+-----------------------------+ AC Fossa       182        0.39        0.22                                 +------------+----------+-------------+----------+-----------------------------+ Prox Forearm   284        0.31        0.18         competing branch        +------------+----------+-------------+----------+-----------------------------+ Mid Forearm    289        0.30        0.19         competing branch        +------------+----------+-------------+----------+-----------------------------+ Dist Forearm   501        0.37        0.16   Retained valve and competing                                                         Assessment/Plan:  This is a 67 y.o. male who is s/p: left RC AVF by Dr. Carlis Abbott on 08/02/18.  -on his f/u visit on 09/07/18, he had a duplex, which revealed the fistula was not maturing.  He returns today for follow up duplex.  His fistula continues to not mature.  He does have several large competing branches.  Pt examined with Dr. Donzetta Matters and will set up for ligation of competing branches and will have him f/u in 6 weeks afterwards to check maturity of fistula.   Discussed with pt that fistulas sometimes need further procedures to help  them mature and even then, may need new access.  He expressed understanding.   Leontine Locket, PA-C [facility] [phone]  Clinic  MD:  Pt seen and examined with Dr. Donzetta Matters

## 2018-10-22 ENCOUNTER — Ambulatory Visit (HOSPITAL_COMMUNITY)
Admission: RE | Admit: 2018-10-22 | Discharge: 2018-10-22 | Disposition: A | Discharge status: Home / Self Care | Payer: Medicare Other | Source: Ambulatory Visit | Attending: Vascular Surgery | Admitting: Vascular Surgery

## 2018-10-22 ENCOUNTER — Encounter: Payer: Self-pay | Admitting: *Deleted

## 2018-10-22 ENCOUNTER — Other Ambulatory Visit: Payer: Self-pay | Admitting: *Deleted

## 2018-10-22 ENCOUNTER — Ambulatory Visit (INDEPENDENT_AMBULATORY_CARE_PROVIDER_SITE_OTHER): Payer: Self-pay | Admitting: Physician Assistant

## 2018-10-22 ENCOUNTER — Other Ambulatory Visit: Payer: Self-pay

## 2018-10-22 VITALS — BP 143/76 | HR 83 | Temp 98.1°F | Resp 20 | Ht 73.0 in | Wt 202.0 lb

## 2018-10-22 DIAGNOSIS — N184 Chronic kidney disease, stage 4 (severe): Secondary | ICD-10-CM

## 2018-10-26 ENCOUNTER — Telehealth: Payer: Self-pay | Admitting: *Deleted

## 2018-10-26 NOTE — Telephone Encounter (Signed)
Patient notified and agreed.  

## 2018-10-26 NOTE — Telephone Encounter (Signed)
I would not recommend due to his renal disease, he can consult a pharmacist or his nephrologist on what they would recommend.

## 2018-10-26 NOTE — Telephone Encounter (Signed)
Patient called and wanted to know if he could take AlkaSeltzer when his stomach feels full and burping. Please Advise.

## 2018-11-01 ENCOUNTER — Encounter (HOSPITAL_COMMUNITY): Payer: Self-pay | Admitting: *Deleted

## 2018-11-01 ENCOUNTER — Other Ambulatory Visit: Payer: Self-pay

## 2018-11-01 NOTE — Progress Notes (Signed)
Pt denies SOB and chest pain. Pt stated that he is under the care of Dr. [name], Cardiology. Pt made aware to stop taking vitamins, fish oil, Glucosamine and herbal medications. Do not take any NSAIDs ie: Ibuprofen, Advil, Naproxen (Aleve), Motrin, BC and Goody powder. Pt made aware to not take Humalog on DOS. Pt made aware to check BG every 2 hours prior to arrival to hospital on DOS. Pt made aware to take 37 units of Lantus insulin the morning of surgery if BG > 70. Pt made aware to not take Lantus insulin if BG<70 on DOS. Pt made aware to treat a BG < 70 with  4 ounces of  cranberry juice, wait 15 minutes after intervention to recheck BG, if BG remains < 70, call Short Stay unit to speak with a nurse. Pt verbalized understanding of all pre-op instructions.

## 2018-11-01 NOTE — Progress Notes (Signed)
   11/01/18 1715  OBSTRUCTIVE SLEEP APNEA  Have you ever been diagnosed with sleep apnea through a sleep study? No  Do you snore loudly (loud enough to be heard through closed doors)?  1  Do you often feel tired, fatigued, or sleepy during the daytime (such as falling asleep during driving or talking to someone)? 0  Has anyone observed you stop breathing during your sleep? 0  Do you have, or are you being treated for high blood pressure? 1  BMI more than 35 kg/m2? 0  Age > 50 (1-yes) 1  Neck circumference greater than:Male 16 inches or larger, Male 17inches or larger? 1  Male Gender (Yes=1) 1  Obstructive Sleep Apnea Score 5

## 2018-11-02 ENCOUNTER — Telehealth: Payer: Self-pay | Admitting: *Deleted

## 2018-11-02 NOTE — Telephone Encounter (Signed)
Patient called and stated that he is having problems with Bowel Movement this morning. Patient stated that he had some Miralax at home and he took it this morning and waiting to see if it works. Stated that it started last night. Last BM "little spurts" the last couple days, nothing much. Just wanted to let you know. Stated that he will call back if the Miralax doesn't work.

## 2018-11-02 NOTE — Telephone Encounter (Signed)
Patient called and stated that he had some questions regarding his surgery on Monday regarding his hand and wanted to know what to do about the pain he was having in it. Stated that he had just spoken with Zigmund Daniel at their office.   I advised him to call [facility] to have his questions answered. He agreed.

## 2018-11-02 NOTE — Telephone Encounter (Signed)
Patient notified

## 2018-11-02 NOTE — Telephone Encounter (Signed)
Noted. Be may benefit from probiotic

## 2018-11-02 NOTE — Telephone Encounter (Signed)
Thank you :)

## 2018-11-05 ENCOUNTER — Ambulatory Visit (HOSPITAL_COMMUNITY)
Admission: RE | Admit: 2018-11-05 | Discharge: 2018-11-05 | Disposition: A | Discharge status: Home / Self Care | Payer: Medicare Other | Attending: Vascular Surgery | Admitting: Vascular Surgery

## 2018-11-05 ENCOUNTER — Other Ambulatory Visit: Payer: Self-pay

## 2018-11-05 ENCOUNTER — Encounter (HOSPITAL_COMMUNITY)
Admission: RE | Disposition: A | Discharge status: Home / Self Care | Payer: Self-pay | Source: Home / Self Care | Attending: Vascular Surgery

## 2018-11-05 ENCOUNTER — Encounter (HOSPITAL_COMMUNITY): Payer: Self-pay

## 2018-11-05 ENCOUNTER — Ambulatory Visit (HOSPITAL_COMMUNITY): Payer: Medicare Other | Admitting: Anesthesiology

## 2018-11-05 DIAGNOSIS — Z7982 Long term (current) use of aspirin: Secondary | ICD-10-CM | POA: Diagnosis not present

## 2018-11-05 DIAGNOSIS — I2510 Atherosclerotic heart disease of native coronary artery without angina pectoris: Secondary | ICD-10-CM | POA: Diagnosis not present

## 2018-11-05 DIAGNOSIS — Z888 Allergy status to other drugs, medicaments and biological substances status: Secondary | ICD-10-CM | POA: Diagnosis not present

## 2018-11-05 DIAGNOSIS — E1122 Type 2 diabetes mellitus with diabetic chronic kidney disease: Secondary | ICD-10-CM | POA: Diagnosis not present

## 2018-11-05 DIAGNOSIS — N184 Chronic kidney disease, stage 4 (severe): Secondary | ICD-10-CM | POA: Insufficient documentation

## 2018-11-05 DIAGNOSIS — I130 Hypertensive heart and chronic kidney disease with heart failure and stage 1 through stage 4 chronic kidney disease, or unspecified chronic kidney disease: Secondary | ICD-10-CM | POA: Diagnosis not present

## 2018-11-05 DIAGNOSIS — Z794 Long term (current) use of insulin: Secondary | ICD-10-CM | POA: Insufficient documentation

## 2018-11-05 DIAGNOSIS — M109 Gout, unspecified: Secondary | ICD-10-CM | POA: Insufficient documentation

## 2018-11-05 DIAGNOSIS — J449 Chronic obstructive pulmonary disease, unspecified: Secondary | ICD-10-CM | POA: Insufficient documentation

## 2018-11-05 DIAGNOSIS — T82898A Other specified complication of vascular prosthetic devices, implants and grafts, initial encounter: Secondary | ICD-10-CM | POA: Diagnosis not present

## 2018-11-05 DIAGNOSIS — Z79899 Other long term (current) drug therapy: Secondary | ICD-10-CM | POA: Diagnosis not present

## 2018-11-05 DIAGNOSIS — Z7951 Long term (current) use of inhaled steroids: Secondary | ICD-10-CM | POA: Diagnosis not present

## 2018-11-05 DIAGNOSIS — G709 Myoneural disorder, unspecified: Secondary | ICD-10-CM | POA: Insufficient documentation

## 2018-11-05 DIAGNOSIS — K219 Gastro-esophageal reflux disease without esophagitis: Secondary | ICD-10-CM | POA: Diagnosis not present

## 2018-11-05 DIAGNOSIS — I509 Heart failure, unspecified: Secondary | ICD-10-CM | POA: Insufficient documentation

## 2018-11-05 DIAGNOSIS — T829XXA Unspecified complication of cardiac and vascular prosthetic device, implant and graft, initial encounter: Secondary | ICD-10-CM | POA: Insufficient documentation

## 2018-11-05 DIAGNOSIS — F17200 Nicotine dependence, unspecified, uncomplicated: Secondary | ICD-10-CM | POA: Insufficient documentation

## 2018-11-05 DIAGNOSIS — Y832 Surgical operation with anastomosis, bypass or graft as the cause of abnormal reaction of the patient, or of later complication, without mention of misadventure at the time of the procedure: Secondary | ICD-10-CM | POA: Diagnosis not present

## 2018-11-05 DIAGNOSIS — N185 Chronic kidney disease, stage 5: Secondary | ICD-10-CM | POA: Diagnosis not present

## 2018-11-05 DIAGNOSIS — I5043 Acute on chronic combined systolic (congestive) and diastolic (congestive) heart failure: Secondary | ICD-10-CM | POA: Diagnosis not present

## 2018-11-05 HISTORY — PX: LIGATION OF COMPETING BRANCHES OF ARTERIOVENOUS FISTULA: SHX5949

## 2018-11-05 LAB — POCT I-STAT 4, (NA,K, GLUC, HGB,HCT)
Glucose, Bld: 128 mg/dL — ABNORMAL HIGH (ref 70–99)
HCT: 35 % — ABNORMAL LOW (ref 39.0–52.0)
Hemoglobin: 11.9 g/dL — ABNORMAL LOW (ref 13.0–17.0)
POTASSIUM: 4.4 mmol/L (ref 3.5–5.1)
Sodium: 141 mmol/L (ref 135–145)

## 2018-11-05 LAB — GLUCOSE, CAPILLARY
Glucose-Capillary: 98 mg/dL (ref 70–99)
Glucose-Capillary: 110 mg/dL — ABNORMAL HIGH (ref 70–99)
Glucose-Capillary: 87 mg/dL (ref 70–99)

## 2018-11-05 SURGERY — LIGATION OF COMPETING BRANCHES OF ARTERIOVENOUS FISTULA
Anesthesia: Monitor Anesthesia Care | Laterality: Left

## 2018-11-05 MED ORDER — ONDANSETRON HCL 4 MG/2ML IJ SOLN
INTRAMUSCULAR | Status: DC | PRN
  Administered 2018-11-05: 4 mg via INTRAVENOUS

## 2018-11-05 MED ORDER — PROPOFOL 10 MG/ML IV BOLUS
INTRAVENOUS | Status: AC
  Filled 2018-11-05: qty 20

## 2018-11-05 MED ORDER — SODIUM CHLORIDE 0.9 % IV SOLN
INTRAVENOUS | Status: DC | PRN
  Administered 2018-11-05: 08:00:00 via INTRAVENOUS

## 2018-11-05 MED ORDER — LIDOCAINE HCL (CARDIAC) PF 100 MG/5ML IV SOSY
PREFILLED_SYRINGE | INTRAVENOUS | Status: DC | PRN
  Administered 2018-11-05: 20 mg via INTRATRACHEAL

## 2018-11-05 MED ORDER — SODIUM CHLORIDE 0.9 % IV SOLN
INTRAVENOUS | Status: DC
  Administered 2018-11-05: 08:00:00 via INTRAVENOUS

## 2018-11-05 MED ORDER — 0.9 % SODIUM CHLORIDE (POUR BTL) OPTIME
TOPICAL | Status: DC | PRN
  Administered 2018-11-05: 1000 mL

## 2018-11-05 MED ORDER — LIDOCAINE 2% (20 MG/ML) 5 ML SYRINGE
INTRAMUSCULAR | Status: AC
  Filled 2018-11-05: qty 15

## 2018-11-05 MED ORDER — MIDAZOLAM HCL 5 MG/5ML IJ SOLN
INTRAMUSCULAR | Status: DC | PRN
  Administered 2018-11-05: 2 mg via INTRAVENOUS

## 2018-11-05 MED ORDER — DEXAMETHASONE SODIUM PHOSPHATE 10 MG/ML IJ SOLN
INTRAMUSCULAR | Status: AC
  Filled 2018-11-05: qty 2

## 2018-11-05 MED ORDER — LIDOCAINE HCL (PF) 1 % IJ SOLN
INTRAMUSCULAR | Status: AC
  Filled 2018-11-05: qty 30

## 2018-11-05 MED ORDER — PROPOFOL 500 MG/50ML IV EMUL
INTRAVENOUS | Status: DC | PRN
  Administered 2018-11-05: 45 ug/kg/min via INTRAVENOUS

## 2018-11-05 MED ORDER — MIDAZOLAM HCL 2 MG/2ML IJ SOLN
INTRAMUSCULAR | Status: AC
  Filled 2018-11-05: qty 2

## 2018-11-05 MED ORDER — FENTANYL CITRATE (PF) 250 MCG/5ML IJ SOLN
INTRAMUSCULAR | Status: AC
  Filled 2018-11-05: qty 5

## 2018-11-05 MED ORDER — CEFAZOLIN SODIUM-DEXTROSE 2-4 GM/100ML-% IV SOLN
2.0000 g | INTRAVENOUS | Status: AC
  Administered 2018-11-05: 2 g via INTRAVENOUS
  Filled 2018-11-05: qty 100

## 2018-11-05 MED ORDER — SODIUM CHLORIDE 0.9 % IV SOLN
INTRAVENOUS | Status: AC
  Filled 2018-11-05: qty 1.2

## 2018-11-05 MED ORDER — PROPOFOL 10 MG/ML IV BOLUS
INTRAVENOUS | Status: DC | PRN
  Administered 2018-11-05 (×2): 40 mg via INTRAVENOUS

## 2018-11-05 MED ORDER — LIDOCAINE-EPINEPHRINE (PF) 1 %-1:200000 IJ SOLN
INTRAMUSCULAR | Status: DC | PRN
  Administered 2018-11-05: 10 mL

## 2018-11-05 MED ORDER — ONDANSETRON HCL 4 MG/2ML IJ SOLN
INTRAMUSCULAR | Status: AC
  Filled 2018-11-05: qty 4

## 2018-11-05 MED ORDER — PHENYLEPHRINE HCL 10 MG/ML IJ SOLN
INTRAMUSCULAR | Status: DC | PRN
  Administered 2018-11-05 (×2): 80 ug via INTRAVENOUS

## 2018-11-05 MED ORDER — HYDROCODONE-ACETAMINOPHEN 5-325 MG PO TABS: 1.0000 | ORAL_TABLET | Freq: Four times a day (QID) | ORAL | 0 refills | Status: DC | PRN

## 2018-11-05 MED ORDER — SODIUM CHLORIDE 0.9 % IV SOLN
INTRAVENOUS | Status: DC | PRN
  Administered 2018-11-05: 35 ug/min via INTRAVENOUS

## 2018-11-05 MED ORDER — FENTANYL CITRATE (PF) 100 MCG/2ML IJ SOLN
INTRAMUSCULAR | Status: DC | PRN
  Administered 2018-11-05 (×2): 25 ug via INTRAVENOUS

## 2018-11-05 SURGICAL SUPPLY — 37 items
ARMBAND PINK RESTRICT EXTREMIT (MISCELLANEOUS) ×3 IMPLANT
CANISTER SUCT 3000ML PPV (MISCELLANEOUS) ×3 IMPLANT
CLIP VESOCCLUDE MED 24/CT (CLIP) IMPLANT
CLIP VESOCCLUDE MED 6/CT (CLIP) ×3 IMPLANT
CLIP VESOCCLUDE SM WIDE 24/CT (CLIP) IMPLANT
CLIP VESOCCLUDE SM WIDE 6/CT (CLIP) ×6 IMPLANT
COVER PROBE W GEL 5X96 (DRAPES) ×3 IMPLANT
COVER WAND RF STERILE (DRAPES) IMPLANT
DERMABOND ADVANCED (GAUZE/BANDAGES/DRESSINGS) ×2
DERMABOND ADVANCED .7 DNX12 (GAUZE/BANDAGES/DRESSINGS) ×1 IMPLANT
ELECT REM PT RETURN 9FT ADLT (ELECTROSURGICAL) ×3
ELECTRODE REM PT RTRN 9FT ADLT (ELECTROSURGICAL) ×1 IMPLANT
GLOVE BIO SURGEON STRL SZ7.5 (GLOVE) ×3 IMPLANT
GLOVE BIOGEL PI IND STRL 6.5 (GLOVE) ×2 IMPLANT
GLOVE BIOGEL PI IND STRL 8 (GLOVE) ×2 IMPLANT
GLOVE BIOGEL PI INDICATOR 6.5 (GLOVE) ×4
GLOVE BIOGEL PI INDICATOR 8 (GLOVE) ×4
GLOVE SURG SS PI 6.0 STRL IVOR (GLOVE) ×3 IMPLANT
GOWN STRL REUS W/ TWL LRG LVL3 (GOWN DISPOSABLE) ×1 IMPLANT
GOWN STRL REUS W/ TWL XL LVL3 (GOWN DISPOSABLE) ×1 IMPLANT
GOWN STRL REUS W/TWL LRG LVL3 (GOWN DISPOSABLE) ×2
GOWN STRL REUS W/TWL XL LVL3 (GOWN DISPOSABLE) ×2
HEMOSTAT SPONGE AVITENE ULTRA (HEMOSTASIS) IMPLANT
KIT BASIN OR (CUSTOM PROCEDURE TRAY) ×3 IMPLANT
KIT TURNOVER KIT B (KITS) ×3 IMPLANT
NS IRRIG 1000ML POUR BTL (IV SOLUTION) ×3 IMPLANT
PACK CV ACCESS (CUSTOM PROCEDURE TRAY) ×3 IMPLANT
PAD ARMBOARD 7.5X6 YLW CONV (MISCELLANEOUS) ×6 IMPLANT
SUT ETHILON 3 0 PS 1 (SUTURE) IMPLANT
SUT MNCRL AB 4-0 PS2 18 (SUTURE) ×6 IMPLANT
SUT PROLENE 6 0 BV (SUTURE) IMPLANT
SUT SILK 0 TIES 10X30 (SUTURE) ×3 IMPLANT
SUT VIC AB 3-0 SH 27 (SUTURE) ×2
SUT VIC AB 3-0 SH 27X BRD (SUTURE) ×1 IMPLANT
TOWEL GREEN STERILE (TOWEL DISPOSABLE) ×3 IMPLANT
UNDERPAD 30X30 (UNDERPADS AND DIAPERS) ×3 IMPLANT
WATER STERILE IRR 1000ML POUR (IV SOLUTION) IMPLANT

## 2018-11-05 NOTE — Anesthesia Postprocedure Evaluation (Signed)
Anesthesia Post Note  Patient: Jacob [name]  Procedure(s) Performed: LIGATION OF COMPETING BRANCHES OF ARTERIOVENOUS FISTULA  LEFT  ARM (Left )     Patient location during evaluation: PACU Anesthesia Type: MAC Level of consciousness: awake and alert Pain management: pain level controlled Vital Signs Assessment: post-procedure vital signs reviewed and stable Respiratory status: spontaneous breathing, nonlabored ventilation, respiratory function stable and patient connected to nasal cannula oxygen Cardiovascular status: stable and blood pressure returned to baseline Postop Assessment: no apparent nausea or vomiting Anesthetic complications: no    Last Vitals:  Vitals:   11/05/18 1129 11/05/18 1130  BP: (!) 147/96   Pulse: 87   Resp: 19   Temp:  36.5 C  SpO2: 99%     Last Pain:  Vitals:   11/05/18 1130  TempSrc:   PainSc: 0-No pain                 Jacob [name]

## 2018-11-05 NOTE — Op Note (Signed)
Date: November 05, 2018  Preoperative diagnosis: Non-maturing left radiocephalic fistula  Postoperative diagnosis: Same  Procedure: Left upper extremity radiocephalic AV fistula revision with sidebranch ligation x4  Surgeon: Dr. Marty Heck, MD  Assistant: OR staff  Indications: Patient is a 68 year old male who previous underwent a left radiocephalic fistula placement in October.  Ultimately the fistula has been slow to mature and a recent left upper extremity fistula duplex showed multiple competing side branches.  He presents today for sidebranch ligation and fistula revision.  I did discuss with him that if the fistula continues to fail maturation after today he will need placement of a new fistula.  Findings: Patient had four side branches in the proximal mid and distal forearm off the main cephalic vein that were all ligated with 3-0 silk ties.  At completion of the case there was a palpable thrill in the upper arm.  Anesthesia: MAC  Details: Patient was taken to the operating room after informed consent was obtained.  He was placed on operative table in supine position.  Left arm was prepped and draped in usual sterile fashion.  A preop timeout was performed to identify patient, procedure and site.  Initially used ultrasound guidance to identify the main cephalic outflow in his left forearm and anastmosis.  Using this we then marked four separate side branches with ultrasound guicdance.  I then made three separate longitudinal incisions over the edge of the fistula in both the distal mid and proximal forearm.  Through these incisions we then found each side branch and these were ligated with 3-0 silk ties.  At the completion of the case there was a thrill in the upper arm near the antecubitum.  All the wounds were then irrigated with saline until effluent was clear.  I then used 3-0 Vicryl in interrupted fashion to close subcutaneous tissue over the fistula and then 4-0 Monocryl in  the skin.  Dermabond was applied.  He was taken to PACU in stable condition.  Condition: Stable  Marty Heck, MD [facility] Office: [phone] Pager: [place]

## 2018-11-05 NOTE — Transfer of Care (Signed)
Immediate Anesthesia Transfer of Care Note  Patient: Jacob [name]  Procedure(s) Performed: LIGATION OF COMPETING BRANCHES OF ARTERIOVENOUS FISTULA  LEFT  ARM (Left )  Patient Location: PACU  Anesthesia Type:MAC  Level of Consciousness: awake, alert  and oriented  Airway & Oxygen Therapy: Patient Spontanous Breathing and Patient connected to nasal cannula oxygen  Post-op Assessment: Report given to RN, Post -op Vital signs reviewed and stable and Patient moving all extremities X 4  Post vital signs: Reviewed and stable  Last Vitals:  Vitals Value Taken Time  BP 105/70 11/05/2018 11:12 AM  Temp 36.5 C 11/05/2018 11:00 AM  Pulse 69 11/05/2018 11:12 AM  Resp 16 11/05/2018 11:12 AM  SpO2 100 % 11/05/2018 11:12 AM  Vitals shown include unvalidated device data.  Last Pain:  Vitals:   11/05/18 1100  TempSrc:   PainSc: (P) 0-No pain         Complications: No apparent anesthesia complications

## 2018-11-05 NOTE — Discharge Instructions (Signed)
.  [facility]  Discharge Instructions  AV Fistula or Graft Surgery for Dialysis Access  Please refer to the following instructions for your post-procedure care. Your surgeon or physician assistant will discuss any changes with you.  Activity  You may drive the day following your surgery, if you are comfortable and no longer taking prescription pain medication. Resume full activity as the soreness in your incision resolves.  Bathing/Showering  You may shower after you go home. Keep your incision dry for 48 hours. Do not soak in a bathtub, hot tub, or swim until the incision heals completely. You may not shower if you have a hemodialysis catheter.  Incision Care  Clean your incision with mild soap and water after 48 hours. Pat the area dry with a clean towel. You do not need a bandage unless otherwise instructed. Do not apply any ointments or creams to your incision. You may have skin glue on your incision. Do not peel it off. It will come off on its own in about one week. Your arm may swell a bit after surgery. To reduce swelling use pillows to elevate your arm so it is above your heart. Your doctor will tell you if you need to lightly wrap your arm with an ACE bandage.  Diet  Resume your normal diet. There are not special food restrictions following this procedure. In order to heal from your surgery, it is CRITICAL to get adequate nutrition. Your body requires vitamins, minerals, and protein. Vegetables are the best source of vitamins and minerals. Vegetables also provide the perfect balance of protein. Processed food has little nutritional value, so try to avoid this.  Medications  Resume taking all of your medications. If your incision is causing pain, you may take over-the counter pain relievers such as acetaminophen (Tylenol). If you were prescribed a stronger pain medication, please be aware these medications can cause nausea and constipation. Prevent  nausea by taking the medication with a snack or meal. Avoid constipation by drinking plenty of fluids and eating foods with high amount of fiber, such as fruits, vegetables, and grains.  Do not take Tylenol if you are taking prescription pain medications.  Follow up Your surgeon may want to see you in the office following your access surgery. If so, this will be arranged at the time of your surgery.  Please call us immediately for any of the following conditions:  Increased pain, redness, drainage (pus) from your incision site Fever of 101 degrees or higher Severe or worsening pain at your incision site Hand pain or numbness.  Reduce your risk of vascular disease:  Stop smoking. If you would like help, call [facility] at 1-800-QUIT-NOW 567-611-3713) or [place] at [place] your cholesterol Maintain a desired weight Control your diabetes Keep your blood pressure down  Dialysis  It will take several weeks to several months for your new dialysis access to be ready for use. Your surgeon will determine when it is okay to use it. Your nephrologist will continue to direct your dialysis. You can continue to use your Permcath until your new access is ready for use.   11/05/2018 DAMARIA STOFKO [id number] 07-Sep-1951  Surgeon(s): Marty Heck, MD  Procedure(s): LIGATION OF COMPETING BRANCHES OF ARTERIOVENOUS FISTULA  LEFT  ARM   May stick graft immediately   May stick graft on designated area only:   x Do not stick for 6 weeks until follow-up    If you have any  questions, please call the office at [phone].

## 2018-11-05 NOTE — H&P (Signed)
History and Physical Interval Note:  11/05/2018 9:08 AM  Jacob [name]  has presented today for surgery, with the diagnosis of competing branches fistula  The various methods of treatment have been discussed with the patient and family. After consideration of risks, benefits and other options for treatment, the patient has consented to  Procedure(s): LIGATION OF COMPETING BRANCHES OF ARTERIOVENOUS FISTULA ARM (Left) as a surgical intervention .  The patient's history has been reviewed, patient examined, no change in status, stable for surgery.  I have reviewed the patient's chart and labs.  Questions were answered to the patient's satisfaction.    Left upper extremity AVF revision with branch ligation.  Marty Heck  CC:  F/u for surgery  HPI:  This is a 68 y.o. male who is s/p left RC AVF by Dr. Carlis Abbott on 08/02/18.  He returned to the office on 09/07/18 for follow up duplex and at that time, the fistula was not maturing.  He was scheduled to return in another 4-5 weeks for repeat duplex.  If it is not maturing, the plan was to convert to left brachiocephalic AVF.    He states that he will have an occasional pain in his left hand at night but it goes away pretty quickly.         Allergies  Allergen Reactions  . Amphetamines Other (See Comments)    Pt is unaware of this -           Current Outpatient Medications  Medication Sig Dispense Refill  . acetaminophen (TYLENOL) 500 MG tablet Take 500 mg by mouth every 6 (six) hours as needed for moderate pain.    [name] allopurinol (ZYLOPRIM) 100 MG tablet Take 1 tablet (100 mg total) by mouth daily. 90 tablet 1  . aspirin EC 81 MG tablet Take 81 mg by mouth daily.    [name] atorvastatin (LIPITOR) 40 MG tablet Take 1 tablet (40 mg total) by mouth daily. 90 tablet 1  . B-D UF III MINI PEN NEEDLES 31G X 5 MM MISC use four times a day 100 each 11  . bictegravir-emtricitabine-tenofovir AF (BIKTARVY) 50-200-25 MG TABS tablet Take 1 tablet  by mouth daily. 30 tablet 11  . Blood Glucose Monitoring Suppl (ONE TOUCH ULTRA MINI) w/Device KIT 1 each by Does not apply route 2 (two) times daily. Dx: E11.40 1 each 0  . carvedilol (COREG) 25 MG tablet TAKE 1 TABLET BY MOUTH TWICE A DAY WITH MEALS 180 tablet 2  . Choline Fenofibrate (FENOFIBRIC ACID) 135 MG CPDR Take 1 capsule by mouth daily. 90 capsule 1  . colchicine 0.6 MG tablet Take one tablet by mouth as needed for gout 30 tablet 2  . furosemide (LASIX) 80 MG tablet Take 1.5 tablets (120 mg total) by mouth 2 (two) times daily. 120 tablet 0  . gabapentin (NEURONTIN) 300 MG capsule TAKE 1 CAPSULE BY MOUTH THREE TIMES A DAY 270 capsule 1  . gentamicin cream (GARAMYCIN) 0.1 % Apply 1 application topically 3 (three) times daily. 30 g 2  . Glucosamine-Chondroit-Vit C-Mn (GLUCOSAMINE CHONDR 1500 COMPLX PO) Take 2 tablets by mouth daily.     [name] glucose blood test strip Use to test blood sugar three times daily. E11.40 300 each 3  . hydrALAZINE (APRESOLINE) 25 MG tablet Take 1 tablet (25 mg total) by mouth 3 (three) times daily. 270 tablet 0  . insulin lispro (HUMALOG KWIKPEN) 100 UNIT/ML KiwkPen Inject 18 units subcutaneously three times daily before meals 33 mL  3  . isosorbide mononitrate (IMDUR) 30 MG 24 hr tablet Take 1 tablet (30 mg total) by mouth daily. 90 tablet 0  . Lancets (ONETOUCH DELICA PLUS [id number]) MISC 1 each by Does not apply route 3 (three) times daily. Use to test blood sugar three times daily. Dx: E11.40 300 each 3  . LANTUS SOLOSTAR 100 UNIT/ML Solostar Pen Inject 74 units subcutaneously every evening 15 mL 6  . latanoprost (XALATAN) 0.005 % ophthalmic solution Place 1 drop into both eyes at bedtime. Reported on 04/04/2016    . nitroGLYCERIN (NITROSTAT) 0.3 MG SL tablet Place 1 tablet (0.3 mg total) under the tongue as needed for chest pain (not to exceed 3 tablets in one day.). 75 tablet 0  . sodium bicarbonate 650 MG tablet Take 650 mg by mouth 2 (two) times daily.   3   . umeclidinium-vilanterol (ANORO ELLIPTA) 62.5-25 MCG/INH AEPB INHALE 1 PUFF BY MOUTH EVERY DAY 60 each 5   No current facility-administered medications for this visit.      ROS:  See HPI  Physical Exam:     Today's Vitals   10/22/18 1513  BP: (!) 143/76  Pulse: 83  Resp: 20  Temp: 98.1 F (36.7 C)  SpO2: 97%  Weight: 202 lb (91.6 kg)  Height: '6\' 1"'  (1.854 m)  PainSc: 7    Body mass index is 26.65 kg/m.  Incision:  Well healed Extremities:  Easily palpable left radial pulse; strong grip left hand; the fistula is easily palpable with good thrill.  There are several obvious competing branches present.   Dialysis duplex 10/22/18: +--------------------+----------+-----------------+--------+ AVF PSV (cm/s)Flow Vol (mL/min)Comments +--------------------+----------+-----------------+--------+ Native artery inflow 196    +--------------------+----------+-----------------+--------+ AVF Anastomosis  188    +--------------------+----------+-----------------+--------+   +------------+----------+-------------+----------+-----------------------------+ OUTFLOW VEINPSV (cm/s)Diameter (cm)Depth (cm) Describe  +------------+----------+-------------+----------+-----------------------------+ AC Fossa  182  0.39  0.22   +------------+----------+-------------+----------+-----------------------------+ Prox Forearm 284  0.31  0.18  competing branch  +------------+----------+-------------+----------+-----------------------------+ Mid Forearm  289  0.30  0.19  competing branch  +------------+----------+-------------+----------+-----------------------------+ Dist Forearm 501  0.37  0.16 Retained valve and competing           Assessment/Plan:  This is a 68 y.o. male who is s/p: left RC AVF by Dr. Carlis Abbott on 08/02/18.  -on his f/u visit on 09/07/18, he had a duplex, which revealed the fistula was not maturing.  He returns today for follow up duplex.  His fistula continues to not mature.  He does have several large competing branches.  Pt examined with Dr. Donzetta Matters and will set up for ligation of competing branches and will have him f/u in 6 weeks afterwards to check maturity of fistula.   Discussed with pt that fistulas sometimes need further procedures to help them mature and even then, may need new access.  He expressed understanding.   Leontine Locket, PA-C [facility] [phone]  Clinic MD:  Pt seen and examined with Dr. Donzetta Matters

## 2018-11-05 NOTE — Anesthesia Preprocedure Evaluation (Addendum)
Anesthesia Evaluation  Patient identified by MRN, date of birth, ID band  Reviewed: Allergy & Precautions, NPO status , Patient's Chart, lab work & pertinent test results  Airway Mallampati: II  TM Distance: >3 FB Neck ROM: Full    Dental  (+) Teeth Intact, Dental Advisory Given, Partial Upper   Pulmonary shortness of breath, COPD, Current Smoker,    breath sounds clear to auscultation       Cardiovascular hypertension, + CAD, + Past MI and +CHF   Rhythm:Regular Rate:Normal     Neuro/Psych PSYCHIATRIC DISORDERS Depression  Neuromuscular disease    GI/Hepatic GERD  Medicated and Controlled,  Endo/Other  diabetes  Renal/GU ESRFRenal disease     Musculoskeletal   Abdominal   Peds  Hematology  (+) Blood dyscrasia, anemia ,   Anesthesia Other Findings   Reproductive/Obstetrics                          Anesthesia Physical Anesthesia Plan  ASA: III  Anesthesia Plan: MAC   Post-op Pain Management:    Induction: Intravenous  PONV Risk Score and Plan: Ondansetron and Propofol infusion  Airway Management Planned: Natural Airway and Simple Face Mask  Additional Equipment:   Intra-op Plan:   Post-operative Plan:   Informed Consent: I have reviewed the patients History and Physical, chart, labs and discussed the procedure including the risks, benefits and alternatives for the proposed anesthesia with the patient or authorized representative who has indicated his/her understanding and acceptance.   Dental Advisory Given  Plan Discussed with: CRNA and Anesthesiologist  Anesthesia Plan Comments:        Anesthesia Quick Evaluation

## 2018-11-05 NOTE — Anesthesia Procedure Notes (Signed)
Procedure Name: MAC Date/Time: 11/05/2018 9:33 AM Performed by: Neldon Newport, CRNA Pre-anesthesia Checklist: Timeout performed, Patient being monitored, Emergency Drugs available, Suction available and Patient identified Patient Re-evaluated:Patient Re-evaluated prior to induction

## 2018-11-06 ENCOUNTER — Encounter (HOSPITAL_COMMUNITY): Payer: Self-pay | Admitting: Vascular Surgery

## 2018-11-06 ENCOUNTER — Telehealth: Payer: Self-pay | Admitting: Vascular Surgery

## 2018-11-06 NOTE — Telephone Encounter (Signed)
sch appt spk to pt [facility] 12/18/2018 1pm Dialysis Duplex 2pm p/o MD

## 2018-11-06 NOTE — Telephone Encounter (Signed)
-----   Message from Marty Heck, MD sent at 11/05/2018 11:17 AM EST ----- Date: November 05, 2018  Preoperative diagnosis: Non-maturing left radiocephalic fistula  Postoperative diagnosis: Same  Procedure: Left upper extremity radiocephalic AV fistula revision with sidebranch ligation x4  Surgeon: Dr. Marty Heck, MD  Assistant: OR staff  #He can follow-up with me in 6 weeks with left arm fistula duplex.  Thanks,  Gerald Stabs

## 2018-11-07 ENCOUNTER — Other Ambulatory Visit: Payer: Self-pay

## 2018-11-07 DIAGNOSIS — N184 Chronic kidney disease, stage 4 (severe): Secondary | ICD-10-CM

## 2018-11-13 ENCOUNTER — Encounter: Payer: Self-pay | Admitting: Internal Medicine

## 2018-11-19 ENCOUNTER — Other Ambulatory Visit: Payer: Self-pay | Admitting: *Deleted

## 2018-11-19 ENCOUNTER — Other Ambulatory Visit: Payer: Self-pay

## 2018-11-19 DIAGNOSIS — E785 Hyperlipidemia, unspecified: Principal | ICD-10-CM

## 2018-11-19 DIAGNOSIS — E1169 Type 2 diabetes mellitus with other specified complication: Secondary | ICD-10-CM

## 2018-11-19 MED ORDER — FENOFIBRIC ACID 135 MG PO CPDR: 1.0000 | DELAYED_RELEASE_CAPSULE | Freq: Every day | ORAL | 0 refills | Status: DC

## 2018-11-19 MED ORDER — GENTAMICIN SULFATE 0.1 % EX CREA: 1.0000 "application " | TOPICAL_CREAM | Freq: Every day | CUTANEOUS | 0 refills | Status: DC

## 2018-11-19 NOTE — Telephone Encounter (Signed)
[facility]

## 2018-11-19 NOTE — Telephone Encounter (Signed)
Pharmacy refill request for Gentamycin cream.  Per Dr. Amalia Hailey verbal order, ok to refill     Script has been sent to pharmacy.

## 2018-11-21 DIAGNOSIS — N2581 Secondary hyperparathyroidism of renal origin: Secondary | ICD-10-CM | POA: Diagnosis not present

## 2018-11-21 DIAGNOSIS — N189 Chronic kidney disease, unspecified: Secondary | ICD-10-CM | POA: Diagnosis not present

## 2018-11-21 DIAGNOSIS — I120 Hypertensive chronic kidney disease with stage 5 chronic kidney disease or end stage renal disease: Secondary | ICD-10-CM | POA: Diagnosis not present

## 2018-11-21 DIAGNOSIS — D631 Anemia in chronic kidney disease: Secondary | ICD-10-CM | POA: Diagnosis not present

## 2018-11-21 DIAGNOSIS — N185 Chronic kidney disease, stage 5: Secondary | ICD-10-CM | POA: Diagnosis not present

## 2018-11-21 DIAGNOSIS — I770 Arteriovenous fistula, acquired: Secondary | ICD-10-CM | POA: Diagnosis not present

## 2018-11-26 NOTE — Progress Notes (Signed)
Cardiology Office Note   Date:  12/03/2018   ID:  Jacob, [name] Sep 21, 1951, MRN [id number]  PCP:  [name], NP  Cardiologist:  Dr. [name]    No chief complaint on file.     History of Present Illness:  68 y.o. with distant CABG in 2004 with EF improving from 24% to 45-50% after revascularization. Last myovue 2016 with scar no ischemia. Has done well with medical Rx and cath avoided due to CRF Now on dialysis  Followed by Dr [name] Had revision of left arm fistula January 2019  Comorbid conditions include HIV previous drug abuse HTN and HLD  No angina.   TEE done 02/28/18 reviewed EF stable 45-50%  Mild MR  Restrictive diastolic filling   Discussed needing myovue as he is about to initiate dialysis CABG is 70 years old Increased CO and stress on heart with dialysis Not clear that recent revision to LUE Fistula by Dr Carlis Abbott has worked   Past Medical History:  Diagnosis Date  . Arthritis    "all over; mostly knees and back" (02/28/2018)  . CHF (congestive heart failure) (HCC)    not on any meds  . Chronic lower back pain    stenosis  . CKD (chronic kidney disease), stage V ([place])    [name] 02/28/2018  . Coronary atherosclerosis of native coronary artery 2005   s/p surgery  . Drug abuse ([place])    hx; tested for cocaine as recently as 2/08. says he is not using drugs now - avoided defib. for this reason   . GERD (gastroesophageal reflux disease)    takes OTC meds as needed  . Glaucoma    uses eye drops daily  . Hepatitis B 1968   "tx'd w/isolation; caught it from toilet stools in gym"  . History of colon polyps    benign  . History of gout    takes Allopurinol daily as well as Colchicine-if needed (02/28/2018)  . History of kidney stones   . HTN (hypertension)    takes Coreg,Imdur.and Apresoline daily  . Human immunodeficiency virus (HIV) disease ([place]) dx'd 1995   takes Genvoya daily  . Hyperlipidemia    takes Atorvastatin daily  . Ischemic cardiomyopathy    . Muscle spasm    takes Zanaflex as needed  . Myocardial infarction ([place]) ~ 2004/2005  . Nocturia   . Peripheral neuropathy    takes gabapentin daily  . Pneumonia    "at least twice" (02/28/2018)  . Shortness of breath dyspnea    rarely but if notices it then with exertion  . Syphilis, unspecified   . Type II diabetes mellitus ([place]) 2004   Lantus daily.Average fasting blood sugar 125-199  . Wears glasses   . Wears partial dentures     Past Surgical History:  Procedure Laterality Date  . AV FISTULA PLACEMENT Left 08/02/2018   Procedure: ARTERIOVENOUS (AV) FISTULA CREATION  left arm radiocephlic;  Surgeon: Marty Heck, MD;  Location: [facility];  Service: Vascular;  Laterality: Left;  . BACK SURGERY    . CARDIAC CATHETERIZATION  10/2002; 12/19/2004   [name] 03/08/2011  . COLONOSCOPY  2013   Dr.John Henrene Pastor   . CORONARY ARTERY BYPASS GRAFT  02/24/2003   CABG X2/notes 03/08/2011  . FRACTURE SURGERY    . INTERTROCHANTERIC HIP FRACTURE SURGERY Left 11/2006   [name] 03/08/2011  . LAPAROSCOPIC CHOLECYSTECTOMY  05/2006  . LIGATION OF COMPETING BRANCHES OF ARTERIOVENOUS FISTULA Left 11/05/2018   Procedure: LIGATION OF  COMPETING BRANCHES OF ARTERIOVENOUS FISTULA  LEFT  ARM;  Surgeon: Marty Heck, MD;  Location: [facility];  Service: Vascular;  Laterality: Left;  . LUMBAR LAMINECTOMY/DECOMPRESSION MICRODISCECTOMY N/A 02/29/2016   Procedure: Left L4-5 Lateral Recess Decompression, Removal Extradural Intraspinal Facet Cyst;  Surgeon: Marybelle Killings, MD;  Location: [place];  Service: Orthopedics;  Laterality: N/A;  . MULTIPLE TOOTH EXTRACTIONS    . ORIF MANDIBULAR FRACTURE Left 08/13/2004   ORIF of left body fracture mandible with KLS Martin 2.3-mm six hole/notes 03/08/2011     Current Outpatient Medications  Medication Sig Dispense Refill  . acetaminophen (TYLENOL) 500 MG tablet Take 500 mg by mouth every 6 (six) hours as needed for moderate pain.    [name] allopurinol (ZYLOPRIM) 100 MG tablet  Take 1 tablet (100 mg total) by mouth daily. 90 tablet 1  . amoxicillin-clavulanate (AUGMENTIN) 875-125 MG tablet Take 1 tablet by mouth 2 (two) times daily for 14 days. 28 tablet 0  . aspirin EC 81 MG tablet Take 81 mg by mouth daily.    [name] atorvastatin (LIPITOR) 40 MG tablet Take 1 tablet (40 mg total) by mouth daily. 90 tablet 1  . B-D UF III MINI PEN NEEDLES 31G X 5 MM MISC use four times a day 100 each 11  . bictegravir-emtricitabine-tenofovir AF (BIKTARVY) 50-200-25 MG TABS tablet Take 1 tablet by mouth daily. 30 tablet 11  . Blood Glucose Monitoring Suppl (ONE TOUCH ULTRA MINI) w/Device KIT 1 each by Does not apply route 2 (two) times daily. Dx: E11.40 1 each 0  . cadexomer iodine (IODOSORB) 0.9 % gel Apply 1 application topically daily as needed for wound care. 40 g 5  . carvedilol (COREG) 25 MG tablet TAKE 1 TABLET BY MOUTH TWICE A DAY WITH MEALS 180 tablet 2  . Cholecalciferol (VITAMIN D3) 50 MCG (2000 UT) TABS Take 2,000 Units by mouth daily.    . Choline Fenofibrate (FENOFIBRIC ACID) 135 MG CPDR Take 1 capsule by mouth daily. 90 capsule 0  . colchicine 0.6 MG tablet Take one tablet by mouth as needed for gout (Patient taking differently: Take 0.6 mg by mouth daily as needed (gout flare up). ) 30 tablet 2  . furosemide (LASIX) 80 MG tablet Take 1.5 tablets (120 mg total) by mouth 2 (two) times daily. 120 tablet 0  . gabapentin (NEURONTIN) 300 MG capsule TAKE 1 CAPSULE BY MOUTH THREE TIMES A DAY (Patient taking differently: Take 300 mg by mouth 3 (three) times daily. ) 270 capsule 1  . gentamicin cream (GARAMYCIN) 0.1 % Apply 1 application topically daily. Foot 30 g 0  . Glucosamine-Chondroit-Vit C-Mn (GLUCOSAMINE CHONDR 1500 COMPLX PO) Take 2 tablets by mouth daily.     [name] glucose blood test strip Use to test blood sugar three times daily. E11.40 300 each 3  . hydrALAZINE (APRESOLINE) 25 MG tablet Take 1 tablet (25 mg total) by mouth 3 (three) times daily. 270 tablet 0  .  HYDROcodone-acetaminophen (NORCO) 5-325 MG tablet Take 1 tablet by mouth every 6 (six) hours as needed for moderate pain. 15 tablet 0  . insulin lispro (HUMALOG KWIKPEN) 100 UNIT/ML KiwkPen Inject 18 units subcutaneously three times daily before meals (Patient taking differently: Inject 18 Units into the skin 2 (two) times daily. ) 33 mL 3  . isosorbide mononitrate (IMDUR) 30 MG 24 hr tablet Take 1 tablet (30 mg total) by mouth daily. 90 tablet 0  . Lancets (ONETOUCH DELICA PLUS IRCVEL38B) MISC 1 each by Does  not apply route 3 (three) times daily. Use to test blood sugar three times daily. Dx: E11.40 300 each 3  . LANTUS SOLOSTAR 100 UNIT/ML Solostar Pen Inject 74 units subcutaneously every evening (Patient taking differently: Inject 74 Units into the skin daily. ) 15 mL 6  . latanoprost (XALATAN) 0.005 % ophthalmic solution Place 1 drop into both eyes at bedtime. Reported on 04/04/2016    . nitroGLYCERIN (NITROSTAT) 0.3 MG SL tablet Place 1 tablet (0.3 mg total) under the tongue as needed for chest pain (not to exceed 3 tablets in one day.). 75 tablet 0  . sodium bicarbonate 650 MG tablet Take 650 mg by mouth 2 (two) times daily.   3  . umeclidinium-vilanterol (ANORO ELLIPTA) 62.5-25 MCG/INH AEPB INHALE 1 PUFF BY MOUTH EVERY DAY (Patient taking differently: Inhale 1 puff into the lungs daily. ) 60 each 5   No current facility-administered medications for this visit.     Allergies:   Amphetamines    Social History:  The patient  reports that he has been smoking cigarettes. He has a 21.50 pack-year smoking history. He has never used smokeless tobacco. He reports current alcohol use. He reports that he does not use drugs.   Family History:  The patient's family history includes Alzheimer's disease in his mother and sister; Diabetes in his brother, brother, and sister; Drug abuse in his brother; Heart attack in his brother; Heart failure in his father; Hypertension in his brother and father; Stroke in  his sister.    ROS:  General:no colds or fevers,  weight down 3 lbs by our scales but at home back to baseline Skin:no rashes or ulcers HEENT:no blurred vision, no congestion CV:see HPI PUL:see HPI GI:no diarrhea constipation or melena, no indigestion GU:no hematuria, no dysuria MS:no joint pain, no claudication Neuro:no syncope, no lightheadedness Endo:+ diabetes glucose stable, no thyroid disease  Wt Readings from Last 3 Encounters:  12/03/18 205 lb (93 kg)  11/05/18 190 lb (86.2 kg)  10/22/18 202 lb (91.6 kg)     PHYSICAL EXAM: VS:  BP (!) 148/76   Pulse 76   Ht '6\' 2"'  (1.88 m)   Wt 205 lb (93 kg)   BMI 26.32 kg/m  , BMI Body mass index is 26.32 kg/m. Affect appropriate Chronically ill black male  HEENT: normal Neck supple with no adenopathy JVP normal no bruits no thyromegaly Lungs clear with no wheezing and good diaphragmatic motion Heart:  S1/S2 no murmur, no rub, gallop or click PMI normal Abdomen: benighn, BS positve, no tenderness, no AAA no bruit.  No HSM or HJR Distal pulses intact with no bruits No edema Neuro non-focal Skin warm and dry No muscular weakness Left arm fistual from wrist to brachial thrill somewhat poor     EKG:   10/21/17 SR rate 77 PVC possible old IMI lateral T wave inversion    Recent Labs: 02/28/2018: B Natriuretic Peptide 691.4 03/13/2018: Platelets 222 04/25/2018: BUN 79; Creat 5.14 11/05/2018: Hemoglobin 11.9; Potassium 4.4; Sodium 141 11/29/2018: ALT 20    Lipid Panel    Component Value Date/Time   CHOL 183 11/29/2018 0933   CHOL 121 08/07/2015 1041   TRIG 240 (H) 11/29/2018 0933   HDL 33 (L) 11/29/2018 0933   HDL 32 (L) 08/07/2015 1041   CHOLHDL 5.5 (H) 11/29/2018 0933   VLDL 48 (H) 03/15/2017 0924   LDLCALC 114 (H) 11/29/2018 9935       Other studies Reviewed: Additional studies/ records that were reviewed  today include: recent OV note.  ECHO  11/10/2016 ------------------------------------------------------------------- Study Conclusions  - Left ventricle: Septal and inferior wall hypokinesis The cavity   size was mildly dilated. There was moderate concentric   hypertrophy. Systolic function was mildly reduced. The estimated   ejection fraction was in the range of 45% to 50%. Doppler   parameters are consistent with both elevated ventricular   end-diastolic filling pressure and elevated left atrial filling   pressure. - Left atrium: The atrium was mildly dilated. - Atrial septum: No defect or patent foramen ovale was identified.  ASSESSMENT AND PLAN:  1.  Acute on chronic systolic and diastolic HF this is related to CRF  TTE 02/28/18 EF 45-50% mild MR   2. CAD no angina, stable.myovue 2016 not ischemic Distant CABG 2004. F/U myovue to r/o high risk scan as he is about to start dialysis   3. DM:  Discussed low carb diet.  Target hemoglobin A1c is 6.5 or less.  Continue current medications.  4. CRF:  Had revision of fistula  11/05/18 f/u nephrology and [facility] Dr Carlis Abbott   5. Cholesterol:  On statin labs with primary   6. HTN:  Well controlled.  Continue current medications and low sodium Dash type diet.       Jenkins Rouge, MD

## 2018-11-28 ENCOUNTER — Ambulatory Visit (INDEPENDENT_AMBULATORY_CARE_PROVIDER_SITE_OTHER): Payer: Medicare Other

## 2018-11-28 ENCOUNTER — Telehealth: Payer: Self-pay | Admitting: Podiatry

## 2018-11-28 ENCOUNTER — Ambulatory Visit (INDEPENDENT_AMBULATORY_CARE_PROVIDER_SITE_OTHER): Payer: Medicare Other | Admitting: Podiatry

## 2018-11-28 DIAGNOSIS — M79609 Pain in unspecified limb: Secondary | ICD-10-CM

## 2018-11-28 DIAGNOSIS — L97522 Non-pressure chronic ulcer of other part of left foot with fat layer exposed: Secondary | ICD-10-CM

## 2018-11-28 DIAGNOSIS — E1142 Type 2 diabetes mellitus with diabetic polyneuropathy: Secondary | ICD-10-CM

## 2018-11-28 DIAGNOSIS — M79676 Pain in unspecified toe(s): Secondary | ICD-10-CM

## 2018-11-28 DIAGNOSIS — B351 Tinea unguium: Secondary | ICD-10-CM | POA: Diagnosis not present

## 2018-11-28 MED ORDER — AMOXICILLIN-POT CLAVULANATE 875-125 MG PO TABS: 1.0000 | ORAL_TABLET | Freq: Two times a day (BID) | ORAL | 0 refills | Status: DC

## 2018-11-28 NOTE — Telephone Encounter (Signed)
I saw Dr. Elisha Ponder this morning and I wanted to know how she wants me to use this cream on my big toe.

## 2018-11-28 NOTE — Progress Notes (Signed)
Subjective: Jacob [name] presents with diabetes, diabetic neuropathy and cc of painful calluses which have been interfering with his ability to walk.  Duration is about 1-1/2 months he relates most painful callus is noted to be on the plantar aspect of his right foot.  He also has callus with history of ulceration of the left great toe.  He relates he did see some blood on his sock about a week ago.  He states he did not think to call the office to make an earlier appointment he just wanted to wait until his scheduled appointment.   [name], NP is his PCP.  His last visit was August 30, 2018   Allergies  Allergen Reactions  . Amphetamines Other (See Comments)    Unknown reaction type    Vascular Examination: Capillary refill time <3 seconds x 10 digits Dorsalis pedis palpable bilaterally . Posterior tibial pulses palpable bilaterally No digital hair x 10 digits Skin temperature gradient within normal limits bilaterally  Dermatological Examination: Skin with normal turgor, texture and tone b/l  Toenails 1-5 b/l discolored, thick, dystrophic with subungual debris and pain with palpation to nailbeds due to thickness of nails.  Hyperkeratotic lesion noted submetatarsal head 5 bilaterally.  There is exquisite tenderness to palpation.  There is no edema, erythema, flocculence or drainage noted.  There is subdermal hemorrhage visible.    There is a quarter sized hyperkeratotic lesion noted left hallux IPJ.  There is no peri-lesional erythema or edema.  There is flocculence appreciated.  Post debridement there is a partial-thickness ulceration with mild malodor.  There is no purulence expressed.     Musculoskeletal: Muscle strength 5/5 to all LE muscle groups  Hallux abductovalgus with bunion deformity noted bilaterally  Mild hammertoe deformity bilateral second digits  Neurological: Sensation diminished with 10 gram monofilament. Vibratory sensation  diminished  X-rays left foot reveal no bony destruction noted at the hallux IPJ.  Assessment: 1. Painful onychomycosis toenails 1-5 b/l 2. Diabetic ulceration left hallux 3. Calluses submetatarsal head 5 bilaterally 4. NIDDM with Diabetic neuropathy  Plan: 1. Discussed diagnosis of diabetic ulceration on today with patient.  He has had a breakdown in this area before.  He has a Darco shoe, so he was instructed to start using his Darco shoe again until this area heals. 2. Prescription for Augmentin 875/125mg  was sent to the pharmacy.  He is to take twice daily for 14 days. 3. Ulcer was debrided and reactive hyperkeratoses and necrotic tissue was resected to the level of bleeding or viable tissue. Ulcer was cleansed with wound cleanser. Iodosorb Gel was applied to base of wound with light dressing. 4. Xray performed left foot.  Reviewed with patient 5. Surgical shoe was not dispensed on today.  He has a surgical shoe at home 6. Patient was given instructions on offloading and dressing change/aftercare and was instructed to call immediately if any signs or symptoms of infection arise.  7. Patient is to follow up in one week with Dr. Amalia Hailey. 8. Patient instructed to report to emergency department with worsening appearance of ulcer/toe/foot, increased pain, foul odor, increased redness, swelling, drainage, fever, chills, nightsweats, nausea, vomiting, increased blood sugar. Patient/POA related understanding. 9. Toenails 1-5 b/l were debrided in length and girth without iatrogenic bleeding. 10. Hyperkeratotic lesion pared with sterile chisel blade submetatarsal head 5 bilaterally. 11. Patient to report any pedal injuries to medical professional immediately. 12. Patient/POA to call should there be a concern in the interim.

## 2018-11-28 NOTE — Patient Instructions (Signed)
Onychomycosis/Fungal Toenails  WHAT IS IT? An infection that lies within the keratin of your nail plate that is caused by a fungus.  WHY ME? Fungal infections affect all ages, sexes, races, and creeds.  There may be many factors that predispose you to a fungal infection such as age, coexisting medical conditions such as diabetes, or an autoimmune disease; stress, medications, fatigue, genetics, etc.  Bottom line: fungus thrives in a warm, moist environment and your shoes offer such a location.  IS IT CONTAGIOUS? Theoretically, yes.  You do not want to share shoes, nail clippers or files with someone who has fungal toenails.  Walking around barefoot in the same room or sleeping in the same bed is unlikely to transfer the organism.  It is important to realize, however, that fungus can spread easily from one nail to the next on the same foot.  HOW DO WE TREAT THIS?  There are several ways to treat this condition.  Treatment may depend on many factors such as age, medications, pregnancy, liver and kidney conditions, etc.  It is best to ask your doctor which options are available to you.  1. No treatment.   Unlike many other medical concerns, you can live with this condition.  However for many people this can be a painful condition and may lead to ingrown toenails or a bacterial infection.  It is recommended that you keep the nails cut short to help reduce the amount of fungal nail. 2. Topical treatment.  These range from herbal remedies to prescription strength nail lacquers.  About 40-50% effective, topicals require twice daily application for approximately 9 to 12 months or until an entirely new nail has grown out.  The most effective topicals are medical grade medications available through physicians offices. 3. Oral antifungal medications.  With an 80-90% cure rate, the most common oral medication requires 3 to 4 months of therapy and stays in your system for a year as the new nail grows out.  Oral  antifungal medications do require blood work to make sure it is a safe drug for you.  A liver function panel will be performed prior to starting the medication and after the first month of treatment.  It is important to have the blood work performed to avoid any harmful side effects.  In general, this medication safe but blood work is required. 4. Laser Therapy.  This treatment is performed by applying a specialized laser to the affected nail plate.  This therapy is noninvasive, fast, and non-painful.  It is not covered by insurance and is therefore, out of pocket.  The results have been very good with a 80-95% cure rate.  [facility] is the only practice in the area to offer this therapy. Permanent Nail Avulsion.  Removing the entire nail so that a new nail will not grow back.Diabetes Mellitus and Foot Care Foot care is an important part of your health, especially when you have diabetes. Diabetes may cause you to have problems because of poor blood flow (circulation) to your feet and legs, which can cause your skin to:  Become thinner and drier.  Break more easily.  Heal more slowly.  Peel and crack. You may also have nerve damage (neuropathy) in your legs and feet, causing decreased feeling in them. This means that you may not notice minor injuries to your feet that could lead to more serious problems. Noticing and addressing any potential problems early is the best way to prevent future foot problems.   How to care for your feet Foot hygiene  Wash your feet daily with warm water and mild soap. Do not use hot water. Then, pat your feet and the areas between your toes until they are completely dry. Do not soak your feet as this can dry your skin.  Trim your toenails straight across. Do not dig under them or around the cuticle. File the edges of your nails with an emery board or nail file.  Apply a moisturizing lotion or petroleum jelly to the skin on your feet and to dry, brittle  toenails. Use lotion that does not contain alcohol and is unscented. Do not apply lotion between your toes. Shoes and socks  Wear clean socks or stockings every day. Make sure they are not too tight. Do not wear knee-high stockings since they may decrease blood flow to your legs.  Wear shoes that fit properly and have enough cushioning. Always look in your shoes before you put them on to be sure there are no objects inside.  To break in new shoes, wear them for just a few hours a day. This prevents injuries on your feet. Wounds, scrapes, corns, and calluses  Check your feet daily for blisters, cuts, bruises, sores, and redness. If you cannot see the bottom of your feet, use a mirror or ask someone for help.  Do not cut corns or calluses or try to remove them with medicine.  If you find a minor scrape, cut, or break in the skin on your feet, keep it and the skin around it clean and dry. You may clean these areas with mild soap and water. Do not clean the area with peroxide, alcohol, or iodine.  If you have a wound, scrape, corn, or callus on your foot, look at it several times a day to make sure it is healing and not infected. Check for: ? Redness, swelling, or pain. ? Fluid or blood. ? Warmth. ? Pus or a bad smell. General instructions  Do not cross your legs. This may decrease blood flow to your feet.  Do not use heating pads or hot water bottles on your feet. They may burn your skin. If you have lost feeling in your feet or legs, you may not know this is happening until it is too late.  Protect your feet from hot and cold by wearing shoes, such as at the beach or on hot pavement.  Schedule a complete foot exam at least once a year (annually) or more often if you have foot problems. If you have foot problems, report any cuts, sores, or bruises to your health care provider immediately. Contact a health care provider if:  You have a medical condition that increases your risk of  infection and you have any cuts, sores, or bruises on your feet.  You have an injury that is not healing.  You have redness on your legs or feet.  You feel burning or tingling in your legs or feet.  You have pain or cramps in your legs and feet.  Your legs or feet are numb.  Your feet always feel cold.  You have pain around a toenail. Get help right away if:  You have a wound, scrape, corn, or callus on your foot and: ? You have pain, swelling, or redness that gets worse. ? You have fluid or blood coming from the wound, scrape, corn, or callus. ? Your wound, scrape, corn, or callus feels warm to the touch. ? You have pus or   a bad smell coming from the wound, scrape, corn, or callus. ? You have a fever. ? You have a red line going up your leg. Summary  Check your feet every day for cuts, sores, red spots, swelling, and blisters.  Moisturize feet and legs daily.  Wear shoes that fit properly and have enough cushioning.  If you have foot problems, report any cuts, sores, or bruises to your health care provider immediately.  Schedule a complete foot exam at least once a year (annually) or more often if you have foot problems. This information is not intended to replace advice given to you by your health care provider. Make sure you discuss any questions you have with your health care provider. Document Released: 10/07/2000 Document Revised: 11/22/2017 Document Reviewed: 11/11/2016 Elsevier Interactive Patient Education  2019 [employer]  

## 2018-11-29 ENCOUNTER — Encounter: Payer: Self-pay | Admitting: Podiatry

## 2018-11-29 ENCOUNTER — Telehealth: Payer: Self-pay | Admitting: *Deleted

## 2018-11-29 ENCOUNTER — Other Ambulatory Visit: Payer: Medicare Other

## 2018-11-29 DIAGNOSIS — E1169 Type 2 diabetes mellitus with other specified complication: Secondary | ICD-10-CM | POA: Diagnosis not present

## 2018-11-29 DIAGNOSIS — E785 Hyperlipidemia, unspecified: Principal | ICD-10-CM

## 2018-11-29 LAB — LIPID PANEL
Cholesterol: 183 mg/dL (ref <200)
HDL: 33 mg/dL — ABNORMAL LOW (ref >=40)
LDL Cholesterol (Calc): 114 mg/dL (calc) — ABNORMAL HIGH
Non-HDL Cholesterol (Calc): 150 mg/dL (calc) — ABNORMAL HIGH (ref <130)
Total CHOL/HDL Ratio: 5.5 (calc) — ABNORMAL HIGH (ref <5.0)
Triglycerides: 240 mg/dL — ABNORMAL HIGH (ref <150)

## 2018-11-29 LAB — HEPATIC FUNCTION PANEL
AG Ratio: 1 (calc) (ref 1.0–2.5)
ALT: 20 U/L (ref 9–46)
AST: 30 U/L (ref 10–35)
Albumin: 4 g/dL (ref 3.6–5.1)
Alkaline phosphatase (APISO): 40 U/L (ref 35–144)
BILIRUBIN DIRECT: 0.1 mg/dL (ref 0.0–0.2)
Globulin: 3.9 g/dL (calc) — ABNORMAL HIGH (ref 1.9–3.7)
Indirect Bilirubin: 0.3 mg/dL (calc) (ref 0.2–1.2)
Total Bilirubin: 0.4 mg/dL (ref 0.2–1.2)
Total Protein: 7.9 g/dL (ref 6.1–8.1)

## 2018-11-29 MED ORDER — CADEXOMER IODINE 0.9 % EX GEL: 1.0000 "application " | Freq: Every day | CUTANEOUS | 5 refills | Status: DC | PRN

## 2018-11-29 NOTE — Telephone Encounter (Signed)
Left message to call for instructions.

## 2018-11-29 NOTE — Telephone Encounter (Signed)
-----   Message from Marzetta Board, DPM sent at 11/28/2018  2:50 PM EST ----- Regarding: Iodosorb Gel Keep left foot dry.  Cleanse toe with saline.   Dry with gauze.  Apply Iodosorb Gel to base of wound.  Wrap  with dressing.  Antibiotics were sent to his pharmacy.  He is to follow up with Dr. Amalia Hailey next week.  Any fever, chills, n/v, nightsweats or worsening of wound, he is to report to ED.  Thanks!

## 2018-11-29 NOTE — Telephone Encounter (Signed)
Pt called, I gave 11/28/2018 2:50pm orders.

## 2018-11-29 NOTE — Telephone Encounter (Signed)
Answered on Staff Message from Dr. Elisha Ponder.

## 2018-11-29 NOTE — Addendum Note (Signed)
Addended by: Harriett Sine D on: 11/29/2018 02:20 PM   Modules accepted: Orders

## 2018-12-03 ENCOUNTER — Encounter: Payer: Self-pay | Admitting: Podiatry

## 2018-12-03 ENCOUNTER — Ambulatory Visit (INDEPENDENT_AMBULATORY_CARE_PROVIDER_SITE_OTHER): Payer: Medicare Other | Admitting: Cardiovascular Disease

## 2018-12-03 ENCOUNTER — Ambulatory Visit (INDEPENDENT_AMBULATORY_CARE_PROVIDER_SITE_OTHER): Payer: Medicare Other | Admitting: Podiatry

## 2018-12-03 VITALS — BP 148/76 | HR 76 | Ht 74.0 in | Wt 205.0 lb

## 2018-12-03 DIAGNOSIS — R079 Chest pain, unspecified: Secondary | ICD-10-CM

## 2018-12-03 DIAGNOSIS — Z951 Presence of aortocoronary bypass graft: Secondary | ICD-10-CM

## 2018-12-03 DIAGNOSIS — L97522 Non-pressure chronic ulcer of other part of left foot with fat layer exposed: Secondary | ICD-10-CM

## 2018-12-03 DIAGNOSIS — E0842 Diabetes mellitus due to underlying condition with diabetic polyneuropathy: Secondary | ICD-10-CM | POA: Diagnosis not present

## 2018-12-03 DIAGNOSIS — I70235 Atherosclerosis of native arteries of right leg with ulceration of other part of foot: Secondary | ICD-10-CM

## 2018-12-03 NOTE — Addendum Note (Signed)
Addended by: Claude Manges on: 12/03/2018 09:54 AM   Modules accepted: Orders

## 2018-12-03 NOTE — Patient Instructions (Signed)
Medication Instructions:   If you need a refill on your cardiac medications before your next appointment, please call your pharmacy.   Lab work:  If you have labs (blood work) drawn today and your tests are completely normal, you will receive your results only by: [name] MyChart Message (if you have MyChart) OR . A paper copy in the mail If you have any lab test that is abnormal or we need to change your treatment, we will call you to review the results.  Testing/Procedures: Your physician has requested that you have a lexiscan myoview. For further information please visit [email or web address]. Please follow instruction sheet, as given.  Follow-Up: At [facility], you and your health needs are our priority.  As part of our continuing mission to provide you with exceptional heart care, we have created designated Provider Care Teams.  These Care Teams include your primary Cardiologist (physician) and Advanced Practice Providers (APPs -  Physician Assistants and Nurse Practitioners) who all work together to provide you with the care you need, when you need it. You will need a follow up appointment in 6 months.  Please call our office 2 months in advance to schedule this appointment.  You may see Dr. [name] or one of the following Advanced Practice Providers on your designated Care Team:   Truitt Merle, NP Cecilie Kicks, NP . Kathyrn Drown, NP

## 2018-12-04 ENCOUNTER — Other Ambulatory Visit: Payer: Self-pay | Admitting: *Deleted

## 2018-12-04 ENCOUNTER — Telehealth: Payer: Self-pay

## 2018-12-04 DIAGNOSIS — E119 Type 2 diabetes mellitus without complications: Secondary | ICD-10-CM

## 2018-12-04 MED ORDER — LANTUS SOLOSTAR 100 UNIT/ML ~~LOC~~ SOPN: PEN_INJECTOR | SUBCUTANEOUS | 6 refills | Status: DC

## 2018-12-04 NOTE — Telephone Encounter (Signed)
Late Entry:  Patient called this am upset after speaking with Sarah(registra/scheduler)yesterday. Patient was asking for clarification on why [name] stated she could not sign form for diabetic shoes. Patient states he had documentation from when she signed it in 2018 and it was processed with no problems and he would like an explanation. Patient asked to speak with Faythe Dingwall Engineer, petroleum) whom was out of office at the time of call.   I informed patient that [name] states the form has to be signed by a MD or DO. A copy of report from 2018 was printed from media (scanned documents) and bought to [place] attention. [name] thought she recalled Dr.Carter having to sign form as well although the records did not reflect that.  A called was placed to [employer] to clarify if form could be singed by NP vs MD/DO. Per Dorene Sorrow from [employer] form can be completed by NP.  Patient was called back and informed of follow-up, patient has appointment tomorrow for face-to-face documentation that will need to be faxed with form. Patient states no need to talk to Faythe Dingwall now that form will be signed. Message handle to patient's apparent satisfaction.

## 2018-12-04 NOTE — Telephone Encounter (Signed)
[facility]

## 2018-12-05 ENCOUNTER — Encounter: Payer: Self-pay | Admitting: Nurse Practitioner

## 2018-12-05 ENCOUNTER — Ambulatory Visit (INDEPENDENT_AMBULATORY_CARE_PROVIDER_SITE_OTHER): Payer: Medicare Other | Admitting: Nurse Practitioner

## 2018-12-05 ENCOUNTER — Ambulatory Visit: Payer: Medicare Other | Admitting: Nurse Practitioner

## 2018-12-05 VITALS — BP 152/80 | HR 67 | Temp 98.0°F | Resp 10 | Ht 73.0 in | Wt 203.0 lb

## 2018-12-05 DIAGNOSIS — N184 Chronic kidney disease, stage 4 (severe): Secondary | ICD-10-CM

## 2018-12-05 DIAGNOSIS — E1140 Type 2 diabetes mellitus with diabetic neuropathy, unspecified: Secondary | ICD-10-CM

## 2018-12-05 DIAGNOSIS — Z794 Long term (current) use of insulin: Secondary | ICD-10-CM

## 2018-12-05 DIAGNOSIS — E785 Hyperlipidemia, unspecified: Secondary | ICD-10-CM

## 2018-12-05 DIAGNOSIS — E1122 Type 2 diabetes mellitus with diabetic chronic kidney disease: Secondary | ICD-10-CM

## 2018-12-05 DIAGNOSIS — B20 Human immunodeficiency virus [HIV] disease: Secondary | ICD-10-CM

## 2018-12-05 DIAGNOSIS — E1169 Type 2 diabetes mellitus with other specified complication: Secondary | ICD-10-CM

## 2018-12-05 DIAGNOSIS — I10 Essential (primary) hypertension: Secondary | ICD-10-CM

## 2018-12-05 DIAGNOSIS — M109 Gout, unspecified: Secondary | ICD-10-CM | POA: Diagnosis not present

## 2018-12-05 MED ORDER — ATORVASTATIN CALCIUM 80 MG PO TABS: 80.0000 mg | ORAL_TABLET | Freq: Every day | ORAL | 3 refills | Status: DC

## 2018-12-05 MED ORDER — ONETOUCH DELICA PLUS LANCET33G MISC: 1.0000 | Freq: Three times a day (TID) | 3 refills | Status: DC

## 2018-12-05 NOTE — Progress Notes (Signed)
Careteam: Patient Care Team: [name], NP as PCP - General (Geriatric Medicine) [name], MD as PCP - Infectious Diseases (Infectious Diseases) [name], MD as Consulting Physician (Ophthalmology) [name], MD as Consulting Physician (Cardiology) [name], MD as Consulting Physician (Infectious Diseases) Gardiner Barefoot, DPM as Consulting Physician (Podiatry) Madelon Lips, MD as Consulting Physician (Nephrology)  Advanced Directive information    Allergies  Allergen Reactions  . Augmentin [Amoxicillin-Pot Clavulanate] Diarrhea    Severe diarrhea   . Amphetamines Other (See Comments)    Unknown reaction type    Chief Complaint  Patient presents with  . Medical Management of Chronic Issues    3 month follow-up and discuss labs   . Form Completion    Complete face-to-face for diabetic shoes  . Best Practice Recommendations    Foot Exam Due, A1c due, and discuss need for Hep C screening      HPI: Patient is a 68 y.o. male seen in the office today for diabetic foot exam and form completion and 3 month follow up.   Tob abuse - ongoing smoking but down to < 1/2 ppd.     HIV - stable on HAART tx (genvoya).  Followed by ID Dr [name] annually.    HTN/CHF - stable on hydralazine, coreg, imdur. Followed by Dr [name]    DM - taking 74 units lantus in the morning, and taking 18 Humalog with meals. No low BS reactions. Numbness/tingling in feet occasionally and cramps in ankles and calves. Followed by podiatry. No ulcerations but has calluses. A1c 7.6%. He is not on ARB due to AKI and hyperkalemia. He has an eye specialist. He has diabetic preulcerative calluses and bunions and is followed by podiatry. Reports he had been started on Augmentin due to foot callus that was concerning by a different provider however his regular podiatrist said these were stable and not to worry abt them he had diarrhea from the augmenting.     Hyperlipidemia -  uncontrolled. Takes lipitor, fenofibrate.   CKD  - stage 4.  Followed by nephrology Dr [name]. Takes NaHco3 tabs, has new fistula placed. Has been having trouble with placement. Having monthly blood work. Not on HD yet.    GOUT- no recent flares. Has not needed colchicine, using allopurinol daily.    Review of Systems:  Review of Systems  Constitutional: Negative for chills, fever and malaise/fatigue.  HENT: Negative for congestion.   Respiratory: Negative for cough and shortness of breath.   Cardiovascular: Negative for chest pain, palpitations and leg swelling.  Gastrointestinal: Negative for abdominal pain, constipation and diarrhea.  Genitourinary: Negative for dysuria and urgency.  Musculoskeletal: Negative for myalgias.  Skin: Negative.   Neurological: Positive for tingling (to feet bilaterally) and sensory change (to feet bilaterall). Negative for dizziness and headaches.    Past Medical History:  Diagnosis Date  . Arthritis    "all over; mostly knees and back" (02/28/2018)  . CHF (congestive heart failure) (HCC)    not on any meds  . Chronic lower back pain    stenosis  . CKD (chronic kidney disease), stage V ([place])    [name] 02/28/2018  . Coronary atherosclerosis of native coronary artery 2005   s/p surgery  . Drug abuse ([place])    hx; tested for cocaine as recently as 2/08. says he is not using drugs now - avoided defib. for this reason   . GERD (gastroesophageal reflux disease)    takes OTC meds as  needed  . Glaucoma    uses eye drops daily  . Hepatitis B 1968   "tx'd w/isolation; caught it from toilet stools in gym"  . History of colon polyps    benign  . History of gout    takes Allopurinol daily as well as Colchicine-if needed (02/28/2018)  . History of kidney stones   . HTN (hypertension)    takes Coreg,Imdur.and Apresoline daily  . Human immunodeficiency virus (HIV) disease ([place]) dx'd 1995   takes Genvoya daily  . Hyperlipidemia    takes Atorvastatin daily  .  Ischemic cardiomyopathy   . Muscle spasm    takes Zanaflex as needed  . Myocardial infarction ([place]) ~ 2004/2005  . Nocturia   . Peripheral neuropathy    takes gabapentin daily  . Pneumonia    "at least twice" (02/28/2018)  . Shortness of breath dyspnea    rarely but if notices it then with exertion  . Syphilis, unspecified   . Type II diabetes mellitus ([place]) 2004   Lantus daily.Average fasting blood sugar 125-199  . Wears glasses   . Wears partial dentures    Past Surgical History:  Procedure Laterality Date  . AV FISTULA PLACEMENT Left 08/02/2018   Procedure: ARTERIOVENOUS (AV) FISTULA CREATION  left arm radiocephlic;  Surgeon: Marty Heck, MD;  Location: [facility];  Service: Vascular;  Laterality: Left;  . BACK SURGERY    . CARDIAC CATHETERIZATION  10/2002; 12/19/2004   [name] 03/08/2011  . COLONOSCOPY  2013   Dr.John Henrene Pastor   . CORONARY ARTERY BYPASS GRAFT  02/24/2003   CABG X2/notes 03/08/2011  . FRACTURE SURGERY    . INTERTROCHANTERIC HIP FRACTURE SURGERY Left 11/2006   [name] 03/08/2011  . LAPAROSCOPIC CHOLECYSTECTOMY  05/2006  . LIGATION OF COMPETING BRANCHES OF ARTERIOVENOUS FISTULA Left 11/05/2018   Procedure: LIGATION OF COMPETING BRANCHES OF ARTERIOVENOUS FISTULA  LEFT  ARM;  Surgeon: Marty Heck, MD;  Location: [facility];  Service: Vascular;  Laterality: Left;  . LUMBAR LAMINECTOMY/DECOMPRESSION MICRODISCECTOMY N/A 02/29/2016   Procedure: Left L4-5 Lateral Recess Decompression, Removal Extradural Intraspinal Facet Cyst;  Surgeon: Marybelle Killings, MD;  Location: [facility];  Service: Orthopedics;  Laterality: N/A;  . MULTIPLE TOOTH EXTRACTIONS    . ORIF MANDIBULAR FRACTURE Left 08/13/2004   ORIF of left body fracture mandible with KLS Martin 2.3-mm six hole/notes 03/08/2011   Social History:   reports that he has been smoking cigarettes. He has a 21.50 pack-year smoking history. He has never used smokeless tobacco. He reports current alcohol use. He reports that he does not  use drugs.  Family History  Problem Relation Age of Onset  . Heart failure Father   . Hypertension Father   . Diabetes Brother   . Heart attack Brother   . Alzheimer's disease Mother   . Stroke Sister   . Diabetes Sister   . Alzheimer's disease Sister   . Hypertension Brother   . Diabetes Brother   . Drug abuse Brother   . Colon cancer Neg Hx     Medications: Patient's Medications  New Prescriptions   ATORVASTATIN (LIPITOR) 80 MG TABLET    Take 1 tablet (80 mg total) by mouth daily.  Previous Medications   ACETAMINOPHEN (TYLENOL) 500 MG TABLET    Take 500 mg by mouth every 6 (six) hours as needed for moderate pain.   ALLOPURINOL (ZYLOPRIM) 100 MG TABLET    Take 1 tablet (100 mg total) by mouth daily.   ASPIRIN EC  81 MG TABLET    Take 81 mg by mouth daily.   B-D UF III MINI PEN NEEDLES 31G X 5 MM MISC    use four times a day   BICTEGRAVIR-EMTRICITABINE-TENOFOVIR AF (BIKTARVY) 50-200-25 MG TABS TABLET    Take 1 tablet by mouth daily.   BLOOD GLUCOSE MONITORING SUPPL (ONE TOUCH ULTRA MINI) W/DEVICE KIT    1 each by Does not apply route 2 (two) times daily. Dx: E11.40   CADEXOMER IODINE (IODOSORB) 0.9 % GEL    Apply 1 application topically daily as needed for wound care.   CARVEDILOL (COREG) 25 MG TABLET    TAKE 1 TABLET BY MOUTH TWICE A DAY WITH MEALS   CHOLECALCIFEROL (VITAMIN D3) 50 MCG (2000 UT) TABS    Take 2,000 Units by mouth daily.   CHOLINE FENOFIBRATE (FENOFIBRIC ACID) 135 MG CPDR    Take 1 capsule by mouth daily.   COLCHICINE 0.6 MG TABLET    Take one tablet by mouth as needed for gout   FUROSEMIDE (LASIX) 80 MG TABLET    Take 1.5 tablets (120 mg total) by mouth 2 (two) times daily.   GABAPENTIN (NEURONTIN) 300 MG CAPSULE    TAKE 1 CAPSULE BY MOUTH THREE TIMES A DAY   GENTAMICIN CREAM (GARAMYCIN) 0.1 %    Apply 1 application topically daily. Foot   GLUCOSAMINE-CHONDROIT-VIT C-MN (GLUCOSAMINE CHONDR 1500 COMPLX PO)    Take 2 tablets by mouth daily.    GLUCOSE BLOOD TEST  STRIP    Use to test blood sugar three times daily. E11.40   HYDRALAZINE (APRESOLINE) 25 MG TABLET    Take 1 tablet (25 mg total) by mouth 3 (three) times daily.   HYDROCODONE-ACETAMINOPHEN (NORCO) 5-325 MG TABLET    Take 1 tablet by mouth every 6 (six) hours as needed for moderate pain.   INSULIN LISPRO (HUMALOG KWIKPEN) 100 UNIT/ML KIWKPEN    Inject 18 units subcutaneously three times daily before meals   ISOSORBIDE MONONITRATE (IMDUR) 30 MG 24 HR TABLET    Take 1 tablet (30 mg total) by mouth daily.   LANTUS SOLOSTAR 100 UNIT/ML SOLOSTAR PEN    Inject 74 units subcutaneously every evening   LATANOPROST (XALATAN) 0.005 % OPHTHALMIC SOLUTION    Place 1 drop into both eyes at bedtime. Reported on 04/04/2016   NITROGLYCERIN (NITROSTAT) 0.3 MG SL TABLET    Place 1 tablet (0.3 mg total) under the tongue as needed for chest pain (not to exceed 3 tablets in one day.).   SODIUM BICARBONATE 650 MG TABLET    Take 650 mg by mouth 2 (two) times daily.    UMECLIDINIUM-VILANTEROL (ANORO ELLIPTA) 62.5-25 MCG/INH AEPB    INHALE 1 PUFF BY MOUTH EVERY DAY  Modified Medications   Modified Medication Previous Medication   LANCETS (ONETOUCH DELICA PLUS [id number]) MISC Lancets (ONETOUCH DELICA PLUS [id number]) MISC      1 each by Does not apply route 3 (three) times daily. Use to test blood sugar three times daily. Dx: E11.40    1 each by Does not apply route 3 (three) times daily. Use to test blood sugar three times daily. Dx: E11.40  Discontinued Medications   ATORVASTATIN (LIPITOR) 40 MG TABLET    Take 1 tablet (40 mg total) by mouth daily.     Physical Exam:  Vitals:   12/05/18 1002  BP: (!) 152/80  Pulse: 67  Resp: 10  Temp: 98 F (36.7 C)  TempSrc: Oral  SpO2: 98%  Weight:  203 lb (92.1 kg)  Height: '6\' 1"'  (1.854 m)   Body mass index is 26.78 kg/m.  Physical Exam Constitutional:      Appearance: He is well-developed.  Eyes:     General: No scleral icterus.    Pupils: Pupils are equal, round,  and reactive to light.  Neck:     Musculoskeletal: Neck supple.     Thyroid: No thyromegaly.     Vascular: No carotid bruit.  Cardiovascular:     Rate and Rhythm: Normal rate and regular rhythm.     Pulses:          Dorsalis pedis pulses are 2+ on the right side and 2+ on the left side.     Heart sounds: Murmur present. No friction rub. No gallop.   Pulmonary:     Effort: Pulmonary effort is normal.     Breath sounds: Normal breath sounds. No wheezing or rales.  Chest:     Chest wall: No tenderness.  Abdominal:     General: Bowel sounds are normal. There is no distension or abdominal bruit.     Palpations: Abdomen is soft. There is no hepatomegaly, mass or pulsatile mass.     Tenderness: There is no abdominal tenderness. There is no guarding or rebound.  Musculoskeletal:     Right foot: Bunion present.     Left foot: Bunion present.  Feet:     Right foot:     Protective Sensation: 3 sites tested. 2 sites sensed.     Skin integrity: Callus present.     Toenail Condition: Fungal disease present.    Left foot:     Protective Sensation: 3 sites tested. 2 sites sensed.     Skin integrity: Callus present.     Toenail Condition: Fungal disease present. Lymphadenopathy:     Cervical: No cervical adenopathy.  Skin:    General: Skin is warm and dry.     Findings: No rash.  Neurological:     Mental Status: He is alert and oriented to person, place, and time.     Deep Tendon Reflexes: Reflexes are normal and symmetric.  Psychiatric:        Behavior: Behavior normal.        Thought Content: Thought content normal.        Judgment: Judgment normal.     Labs reviewed: Basic Metabolic Panel: Recent Labs    02/28/18 1640 03/01/18 0300 03/05/18 1443 03/13/18 1034 04/25/18 1227 08/02/18 1008 11/05/18 0739  NA 137 137 138 138 138 147* 141  K 6.8* 5.4* 5.1 5.1 4.5 4.5 4.4  CL 106 106 107 112* 107  --   --   CO2 '22 23 21 ' 17* 19*  --   --   GLUCOSE 292* 234* 89 165* 112* 130*  128*  BUN 60* 66* 78* 78* 79*  --   --   CREATININE 4.70* 4.22* 4.25* 4.11* 5.14*  --   --   CALCIUM 9.4 9.0 8.9 9.3 9.0  --   --   PHOS 3.5 3.7  --   --   --   --   --    Liver Function Tests: Recent Labs    02/27/18 1600 02/28/18 0546 02/28/18 1640 03/01/18 0300 11/29/18 0933  AST 24 37  --   --  30  ALT 16 25  --   --  20  ALKPHOS  --  36*  --   --   --   BILITOT 0.3 1.0  --   --  0.4  PROT 7.7 7.4  --   --  7.9  ALBUMIN  --  3.4* 3.5 3.2*  --    No results for input(s): LIPASE, AMYLASE in the last 8760 hours. No results for input(s): AMMONIA in the last 8760 hours. CBC: Recent Labs    02/27/18 1600 02/28/18 0546 03/01/18 0300 03/13/18 1034 08/02/18 1008 11/05/18 0739  WBC 5.7 10.5 10.1 6.2  --   --   NEUTROABS 2,491  --   --  3,150  --   --   HGB 12.8* 12.6* 11.7* 12.8* 13.3 11.9*  HCT 36.8* 38.4* 35.2* 36.9* 39.0 35.0*  MCV 95.8 99.7 97.8 95.1  --   --   PLT 186 167 173 222  --   --    Lipid Panel: Recent Labs    01/08/18 0827 05/30/18 1007 11/29/18 0933  CHOL 170 178 183  HDL 31* 29* 33*  LDLCALC  --  112* 114*  TRIG 436* 245* 240*  CHOLHDL 5.5* 6.1* 5.5*   TSH: No results for input(s): TSH in the last 8760 hours. A1C: Lab Results  Component Value Date   HGBA1C 6.8 (H) 08/30/2018     Assessment/Plan 1. Hyperlipidemia associated with type 2 diabetes mellitus ([place]) -not controlled, LDL <70. Will increase lipitor to 80 mg daily and to continue heart healthy diet. Continues on fenofibrate  - atorvastatin (LIPITOR) 80 MG tablet; Take 1 tablet (80 mg total) by mouth daily.  Dispense: 90 tablet; Refill: 3  2. CKD stage 4 due to type 2 diabetes mellitus ([place]) -following with nephrologist  - Lipid Panel; Future - Hemoglobin A1c; Future  3. Human immunodeficiency virus (HIV) disease ([place]) -continues on Biktarvy daily  - Lipid Panel; Future - Hepatic Function Panel; Future  4. Type 2 diabetes mellitus with diabetic neuropathy, with long-term  current use of insulin (HCC) -A1c at goal in November, blood sugars continue to be well controlled without hypoglycemia, pt continues diet modifications.  -Encouraged dietary compliance, routine foot care/monitoring and to keep up with diabetic eye exams through ophthalmology  -diabetic shoe form completed per request. [place] to confirm NP could sign in the past MD/DO had to complete form.  - Lipid Panel; Future - Hemoglobin A1c; Future  5. Gout, unspecified cause, unspecified chronicity, unspecified site -no recent flares, continues on allopurinol daily - Uric acid; Future  6. Hypertension -elevated today, unchanged on recheck. reports he feels like this is due to being in office. States he takes the bus and had to walk longer to get here. Also with multiple cups of coffee this morning. Pt states blood pressure has been controlled at nephrologist office and he has cardiology appt pending. Will continue current regimen but if bp continues to be elevated will need adjustments in medication. DASH diet given.  Next appt: 3 months with labs prior to visit.  [name]. [place], [facility] [phone]

## 2018-12-05 NOTE — Patient Instructions (Addendum)
To increase lipitor to 80 mg daily to receive optimal control on cholesterol. Goal is LDL <80. Continue dietary modifications.    Heart-Healthy Eating Plan Heart-healthy meal planning includes:  Eating less unhealthy fats.  Eating more healthy fats.  Making other changes in your diet. Talk with your doctor or a diet specialist (dietitian) to create an eating plan that is right for you. What are tips for following this plan? Cooking Avoid frying your food. Try to bake, boil, grill, or broil it instead. You can also reduce fat by:  Removing the skin from poultry.  Removing all visible fats from meats.  Steaming vegetables in water or broth. Meal planning   At meals, divide your plate into four equal parts: ? Fill one-half of your plate with vegetables and green salads. ? Fill one-fourth of your plate with whole grains. ? Fill one-fourth of your plate with lean protein foods.  Eat 4-5 servings of vegetables per day. A serving of vegetables is: ? 1 cup of raw or cooked vegetables. ? 2 cups of raw leafy greens.  Eat 4-5 servings of fruit per day. A serving of fruit is: ? 1 medium whole fruit. ?  cup of dried fruit. ?  cup of fresh, frozen, or canned fruit. ?  cup of 100% fruit juice.  Eat more foods that have soluble fiber. These are apples, broccoli, carrots, beans, peas, and barley. Try to get 20-30 g of fiber per day.  Eat 4-5 servings of nuts, legumes, and seeds per week: ? 1 serving of dried beans or legumes equals  cup after being cooked. ? 1 serving of nuts is  cup. ? 1 serving of seeds equals 1 tablespoon. General information  Eat more home-cooked food. Eat less restaurant, buffet, and fast food.  Limit or avoid alcohol.  Limit foods that are high in starch and sugar.  Avoid fried foods.  Lose weight if you are overweight.  Keep track of how much salt (sodium) you eat. This is important if you have high blood pressure. Ask your doctor to tell you more  about this.  Try to add vegetarian meals each week. Fats  Choose healthy fats. These include olive oil and canola oil, flaxseeds, walnuts, almonds, and seeds.  Eat more omega-3 fats. These include salmon, mackerel, sardines, tuna, flaxseed oil, and ground flaxseeds. Try to eat fish at least 2 times each week.  Check food labels. Avoid foods with trans fats or high amounts of saturated fat.  Limit saturated fats. ? These are often found in animal products, such as meats, butter, and cream. ? These are also found in plant foods, such as palm oil, palm kernel oil, and coconut oil.  Avoid foods with partially hydrogenated oils in them. These have trans fats. Examples are stick margarine, some tub margarines, cookies, crackers, and other baked goods. What foods can I eat? Fruits All fresh, canned (in natural juice), or frozen fruits. Vegetables Fresh or frozen vegetables (raw, steamed, roasted, or grilled). Green salads. Grains Most grains. Choose whole wheat and whole grains most of the time. Rice and pasta, including brown rice and pastas made with whole wheat. Meats and other proteins Lean, well-trimmed beef, veal, pork, and lamb. Chicken and [place] without skin. All fish and shellfish. Wild duck, rabbit, pheasant, and venison. Egg whites or low-cholesterol egg substitutes. Dried beans, peas, lentils, and tofu. Seeds and most nuts. Dairy Low-fat or nonfat cheeses, including ricotta and mozzarella. Skim or 1% milk that is liquid, powdered, or  evaporated. Buttermilk that is made with low-fat milk. Nonfat or low-fat yogurt. Fats and oils Non-hydrogenated (trans-free) margarines. Vegetable oils, including soybean, sesame, sunflower, olive, peanut, safflower, corn, canola, and cottonseed. Salad dressings or mayonnaise made with a vegetable oil. Beverages Mineral water. Coffee and tea. Diet carbonated beverages. Sweets and desserts Sherbet, gelatin, and fruit ice. Small amounts of dark  chocolate. Limit all sweets and desserts. Seasonings and condiments All seasonings and condiments. The items listed above may not be a complete list of foods and drinks you can eat. Contact a dietitian for more options. What foods should I avoid? Fruits Canned fruit in heavy syrup. Fruit in cream or butter sauce. Fried fruit. Limit coconut. Vegetables Vegetables cooked in cheese, cream, or butter sauce. Fried vegetables. Grains Breads that are made with saturated or trans fats, oils, or whole milk. Croissants. Sweet rolls. Donuts. High-fat crackers, such as cheese crackers. Meats and other proteins Fatty meats, such as hot dogs, ribs, sausage, bacon, rib-eye roast or steak. High-fat deli meats, such as salami and bologna. Caviar. Domestic duck and goose. Organ meats, such as liver. Dairy Cream, sour cream, cream cheese, and creamed cottage cheese. Whole-milk cheeses. Whole or 2% milk that is liquid, evaporated, or condensed. Whole buttermilk. Cream sauce or high-fat cheese sauce. Yogurt that is made from whole milk. Fats and oils Meat fat, or shortening. Cocoa butter, hydrogenated oils, palm oil, coconut oil, palm kernel oil. Solid fats and shortenings, including bacon fat, salt pork, lard, and butter. Nondairy cream substitutes. Salad dressings with cheese or sour cream. Beverages Regular sodas and juice drinks with added sugar. Sweets and desserts Frosting. Pudding. Cookies. Cakes. Pies. Milk chocolate or white chocolate. Buttered syrups. Full-fat ice cream or ice cream drinks. The items listed above may not be a complete list of foods and drinks to avoid. Contact a dietitian for more information. Summary  Heart-healthy meal planning includes eating less unhealthy fats, eating more healthy fats, and making other changes in your diet.  Eat a balanced diet. This includes fruits and vegetables, low-fat or nonfat dairy, lean protein, nuts and legumes, whole grains, and heart-healthy oils and  fats. This information is not intended to replace advice given to you by your health care provider. Make sure you discuss any questions you have with your health care provider. Document Released: 04/10/2012 Document Revised: 11/17/2017 Document Reviewed: 11/17/2017 Elsevier Interactive Patient Education  2019 [place] DASH stands for "Dietary Approaches to Stop Hypertension." The DASH eating plan is a healthy eating plan that has been shown to reduce high blood pressure (hypertension). It may also reduce your risk for type 2 diabetes, heart disease, and stroke. The DASH eating plan may also help with weight loss. What are tips for following this plan?  General guidelines  Avoid eating more than 2,300 mg (milligrams) of salt (sodium) a day. If you have hypertension, you may need to reduce your sodium intake to 1,500 mg a day.  Limit alcohol intake to no more than 1 drink a day for nonpregnant women and 2 drinks a day for men. One drink equals 12 oz of beer, 5 oz of wine, or 1 oz of hard liquor.  Work with your health care provider to maintain a healthy body weight or to lose weight. Ask what an ideal weight is for you.  Get at least 30 minutes of exercise that causes your heart to beat faster (aerobic exercise) most days of the week. Activities may include walking, swimming, or  biking.  Work with your health care provider or diet and nutrition specialist (dietitian) to adjust your eating plan to your individual calorie needs. Reading food labels   Check food labels for the amount of sodium per serving. Choose foods with less than 5 percent of the Daily Value of sodium. Generally, foods with less than 300 mg of sodium per serving fit into this eating plan.  To find whole grains, look for the word "whole" as the first word in the ingredient list. Shopping  Buy products labeled as "low-sodium" or "no salt added."  Buy fresh foods. Avoid canned foods and premade or  frozen meals. Cooking  Avoid adding salt when cooking. Use salt-free seasonings or herbs instead of table salt or sea salt. Check with your health care provider or pharmacist before using salt substitutes.  Do not fry foods. Cook foods using healthy methods such as baking, boiling, grilling, and broiling instead.  Cook with heart-healthy oils, such as olive, canola, soybean, or sunflower oil. Meal planning  Eat a balanced diet that includes: ? 5 or more servings of fruits and vegetables each day. At each meal, try to fill half of your plate with fruits and vegetables. ? Up to 6-8 servings of whole grains each day. ? Less than 6 oz of lean meat, poultry, or fish each day. A 3-oz serving of meat is about the same size as a deck of cards. One egg equals 1 oz. ? 2 servings of low-fat dairy each day. ? A serving of nuts, seeds, or beans 5 times each week. ? Heart-healthy fats. Healthy fats called Omega-3 fatty acids are found in foods such as flaxseeds and coldwater fish, like sardines, salmon, and mackerel.  Limit how much you eat of the following: ? Canned or prepackaged foods. ? Food that is high in trans fat, such as fried foods. ? Food that is high in saturated fat, such as fatty meat. ? Sweets, desserts, sugary drinks, and other foods with added sugar. ? Full-fat dairy products.  Do not salt foods before eating.  Try to eat at least 2 vegetarian meals each week.  Eat more home-cooked food and less restaurant, buffet, and fast food.  When eating at a restaurant, ask that your food be prepared with less salt or no salt, if possible. What foods are recommended? The items listed may not be a complete list. Talk with your dietitian about what dietary choices are best for you. Grains Whole-grain or whole-wheat bread. Whole-grain or whole-wheat pasta. Brown rice. [place]. Bulgur. Whole-grain and low-sodium cereals. Pita bread. Low-fat, low-sodium crackers. Whole-wheat flour  tortillas. Vegetables Fresh or frozen vegetables (raw, steamed, roasted, or grilled). Low-sodium or reduced-sodium tomato and vegetable juice. Low-sodium or reduced-sodium tomato sauce and tomato paste. Low-sodium or reduced-sodium canned vegetables. Fruits All fresh, dried, or frozen fruit. Canned fruit in natural juice (without added sugar). Meat and other protein foods Skinless chicken or [place]. Ground chicken or [place]. Pork with fat trimmed off. Fish and seafood. Egg whites. Dried beans, peas, or lentils. Unsalted nuts, nut butters, and seeds. Unsalted canned beans. Lean cuts of beef with fat trimmed off. Low-sodium, lean deli meat. Dairy Low-fat (1%) or fat-free (skim) milk. Fat-free, low-fat, or reduced-fat cheeses. Nonfat, low-sodium ricotta or cottage cheese. Low-fat or nonfat yogurt. Low-fat, low-sodium cheese. Fats and oils Soft margarine without trans fats. Vegetable oil. Low-fat, reduced-fat, or light mayonnaise and salad dressings (reduced-sodium). Canola, safflower, olive, soybean, and sunflower oils. Avocado. Seasoning and other foods Herbs. Spices.  Seasoning mixes without salt. Unsalted popcorn and pretzels. Fat-free sweets. What foods are not recommended? The items listed may not be a complete list. Talk with your dietitian about what dietary choices are best for you. Grains Baked goods made with fat, such as croissants, muffins, or some breads. Dry pasta or rice meal packs. Vegetables Creamed or fried vegetables. Vegetables in a cheese sauce. Regular canned vegetables (not low-sodium or reduced-sodium). Regular canned tomato sauce and paste (not low-sodium or reduced-sodium). Regular tomato and vegetable juice (not low-sodium or reduced-sodium). Angie Fava. Olives. Fruits Canned fruit in a light or heavy syrup. Fried fruit. Fruit in cream or butter sauce. Meat and other protein foods Fatty cuts of meat. Ribs. Fried meat. Berniece Salines. Sausage. Bologna and other processed lunch meats.  Salami. Fatback. Hotdogs. Bratwurst. Salted nuts and seeds. Canned beans with added salt. Canned or smoked fish. Whole eggs or egg yolks. Chicken or [place] with skin. Dairy Whole or 2% milk, cream, and half-and-half. Whole or full-fat cream cheese. Whole-fat or sweetened yogurt. Full-fat cheese. Nondairy creamers. Whipped toppings. Processed cheese and cheese spreads. Fats and oils Butter. Stick margarine. Lard. Shortening. Ghee. Bacon fat. Tropical oils, such as coconut, palm kernel, or palm oil. Seasoning and other foods Salted popcorn and pretzels. Onion salt, garlic salt, seasoned salt, table salt, and sea salt. Worcestershire sauce. Tartar sauce. Barbecue sauce. Teriyaki sauce. Soy sauce, including reduced-sodium. Steak sauce. Canned and packaged gravies. Fish sauce. Oyster sauce. Cocktail sauce. Horseradish that you find on the shelf. Ketchup. Mustard. Meat flavorings and tenderizers. Bouillon cubes. Hot sauce and Tabasco sauce. Premade or packaged marinades. Premade or packaged taco seasonings. Relishes. Regular salad dressings. Where to find more information:  National Heart, Lung, and [place]: https://wilson-eaton.com/  American Heart Association: www.heart.org Summary  The DASH eating plan is a healthy eating plan that has been shown to reduce high blood pressure (hypertension). It may also reduce your risk for type 2 diabetes, heart disease, and stroke.  With the DASH eating plan, you should limit salt (sodium) intake to 2,300 mg a day. If you have hypertension, you may need to reduce your sodium intake to 1,500 mg a day.  When on the DASH eating plan, aim to eat more fresh fruits and vegetables, whole grains, lean proteins, low-fat dairy, and heart-healthy fats.  Work with your health care provider or diet and nutrition specialist (dietitian) to adjust your eating plan to your individual calorie needs. This information is not intended to replace advice given to you by your health  care provider. Make sure you discuss any questions you have with your health care provider. Document Released: 09/29/2011 Document Revised: 10/03/2016 Document Reviewed: 10/03/2016 Elsevier Interactive Patient Education  2019 [employer].

## 2018-12-07 NOTE — Progress Notes (Signed)
Subjective:  68 year old male presenting today for follow up evaluation of an ulceration of the right hallux. He states he is doing well. He denies any pain or modifying factors. He has states he has been taking the Augmentin previously prescribed as directed. Patient is here for further evaluation and treatment.   Past Medical History:  Diagnosis Date  . Arthritis    "all over; mostly knees and back" (02/28/2018)  . CHF (congestive heart failure) (HCC)    not on any meds  . Chronic lower back pain    stenosis  . CKD (chronic kidney disease), stage V ([place])    [name] 02/28/2018  . Coronary atherosclerosis of native coronary artery 2005   s/p surgery  . Drug abuse ([place])    hx; tested for cocaine as recently as 2/08. says he is not using drugs now - avoided defib. for this reason   . GERD (gastroesophageal reflux disease)    takes OTC meds as needed  . Glaucoma    uses eye drops daily  . Hepatitis B 1968   "tx'd w/isolation; caught it from toilet stools in gym"  . History of colon polyps    benign  . History of gout    takes Allopurinol daily as well as Colchicine-if needed (02/28/2018)  . History of kidney stones   . HTN (hypertension)    takes Coreg,Imdur.and Apresoline daily  . Human immunodeficiency virus (HIV) disease ([place]) dx'd 1995   takes Genvoya daily  . Hyperlipidemia    takes Atorvastatin daily  . Ischemic cardiomyopathy   . Muscle spasm    takes Zanaflex as needed  . Myocardial infarction ([place]) ~ 2004/2005  . Nocturia   . Peripheral neuropathy    takes gabapentin daily  . Pneumonia    "at least twice" (02/28/2018)  . Shortness of breath dyspnea    rarely but if notices it then with exertion  . Syphilis, unspecified   . Type II diabetes mellitus ([place]) 2004   Lantus daily.Average fasting blood sugar 125-199  . Wears glasses   . Wears partial dentures     Objective/Physical Exam General: The patient is alert and oriented x3 in no acute distress.  Dermatology:   Wound #1 noted to the right hallux measuring 0.3 x 0.3 x 0.1 cm (LxWxD).   To the noted ulceration(s), there is no eschar. There is a moderate amount of slough, fibrin, and necrotic tissue noted. Granulation tissue and wound base is red. There is a minimal amount of serosanguineous drainage noted. There is no exposed bone muscle-tendon ligament or joint. There is no malodor. Periwound integrity is intact. Skin is warm, dry and supple bilateral lower extremities.  Vascular: Palpable pedal pulses bilaterally. No edema or erythema noted. Capillary refill within normal limits.  Neurological: Epicritic and protective threshold diminished bilaterally.   Musculoskeletal Exam: Range of motion within normal limits to all pedal and ankle joints bilateral. Muscle strength 5/5 in all groups bilateral.   Assessment: #1 ulceration of the right hallux secondary to diabetes mellitus #2 diabetes mellitus w/ peripheral neuropathy   Plan of Care:  #1 Patient was evaluated. #2 medically necessary excisional debridement including subcutaneous tissue was performed using a tissue nipper and a chisel blade. Excisional debridement of all the necrotic nonviable tissue down to healthy bleeding viable tissue was performed with post-debridement measurements same as pre-. #3 the wound was cleansed and dry sterile dressing applied. #4 Recommended Silvadene cream daily with a bandage.  #5 Recommended good shoe gear.  #  6 patient is to return to clinic in 4 weeks.   Edrick Kins, DPM [facility]  Dr. Edrick Kins, [address]                                        [place], [place] [address]                Office [phone]  Fax [phone]

## 2018-12-11 ENCOUNTER — Telehealth (HOSPITAL_COMMUNITY): Payer: Self-pay

## 2018-12-11 NOTE — Telephone Encounter (Signed)
Spoke with the patient, he stated that he understand the instructions and would be here for his test. S.Philis Doke EMTP

## 2018-12-13 ENCOUNTER — Telehealth: Payer: Self-pay | Admitting: *Deleted

## 2018-12-13 ENCOUNTER — Ambulatory Visit (HOSPITAL_COMMUNITY): Payer: Medicare Other | Attending: Cardiovascular Disease

## 2018-12-13 DIAGNOSIS — Z951 Presence of aortocoronary bypass graft: Secondary | ICD-10-CM | POA: Diagnosis not present

## 2018-12-13 DIAGNOSIS — R079 Chest pain, unspecified: Secondary | ICD-10-CM | POA: Insufficient documentation

## 2018-12-13 LAB — MYOCARDIAL PERFUSION IMAGING
CHL CUP NUCLEAR SDS: 1
LV dias vol: 232 mL (ref 62–150)
LV sys vol: 172 mL
Peak HR: 82 {beats}/min
Rest HR: 64 {beats}/min
SRS: 17
SSS: 19
TID: 1.06

## 2018-12-13 MED ORDER — TECHNETIUM TC 99M TETROFOSMIN IV KIT
9.6000 | PACK | Freq: Once | INTRAVENOUS | Status: AC | PRN
  Administered 2018-12-13: 9.6 via INTRAVENOUS
  Filled 2018-12-13: qty 10

## 2018-12-13 MED ORDER — TECHNETIUM TC 99M TETROFOSMIN IV KIT
31.4000 | PACK | Freq: Once | INTRAVENOUS | Status: AC | PRN
  Administered 2018-12-13: 31.4 via INTRAVENOUS
  Filled 2018-12-13: qty 32

## 2018-12-13 MED ORDER — REGADENOSON 0.4 MG/5ML IV SOLN
0.4000 mg | Freq: Once | INTRAVENOUS | Status: AC
  Administered 2018-12-13: 0.4 mg via INTRAVENOUS

## 2018-12-13 NOTE — Telephone Encounter (Signed)
Patient called and stated that his blood sugars have been running high and wants to increase his insulin. Stated he doesn't know why its high.   12/13/18- am 212 12/12/18- pm 207 12/12/18- am 197 12/11/18- am 141 12/11/18- pm 155 12/10/18- am 235 12/10/18- pm 271  Current Insulin dosage:  74 units of Lantus 18 units of Humalog  This morning increased insulin to 76 units of Lantus and 20 units of Humalog  Patient stated the only changes he has made is increasing his Atorvastatin and has been eating oranges and drinking orange juice for his cold. Please Advise.

## 2018-12-13 NOTE — Telephone Encounter (Signed)
Orange juice has a lot of sugar in it. I would recommend stopping this and taking OTC vit C supplement that doesn't have sugar added.

## 2018-12-13 NOTE — Telephone Encounter (Signed)
Patient notified and agreed.  

## 2018-12-18 ENCOUNTER — Ambulatory Visit (HOSPITAL_COMMUNITY)
Admission: RE | Admit: 2018-12-18 | Discharge: 2018-12-18 | Disposition: A | Discharge status: Home / Self Care | Payer: Medicare Other | Source: Ambulatory Visit | Attending: Vascular Surgery | Admitting: Vascular Surgery

## 2018-12-18 ENCOUNTER — Encounter: Payer: Self-pay | Admitting: Vascular Surgery

## 2018-12-18 ENCOUNTER — Ambulatory Visit (INDEPENDENT_AMBULATORY_CARE_PROVIDER_SITE_OTHER): Payer: Self-pay | Admitting: Vascular Surgery

## 2018-12-18 VITALS — BP 157/70 | HR 66 | Resp 16 | Ht 73.0 in | Wt 203.9 lb

## 2018-12-18 DIAGNOSIS — E1122 Type 2 diabetes mellitus with diabetic chronic kidney disease: Secondary | ICD-10-CM

## 2018-12-18 DIAGNOSIS — N184 Chronic kidney disease, stage 4 (severe): Secondary | ICD-10-CM

## 2018-12-18 NOTE — Progress Notes (Signed)
Patient name: Jacob [name] MRN: [id number] DOB: 10/10/1951 Sex: male  REASON FOR VISIT: Postop check  HPI: Jacob [name] is a 68 y.o. male that presents for postop check for non-maturing left radiocephalic AV fistula that required sidebranch ligation x4 on 11/05/2018.  He reports no issues in follow-up today.  Fistula still has a thrill.  Not having any significant numbness or weakness in the left hand.  He has not started dialysis yet.  States he has an appointment with Dr. [name] next month.  Past Medical History:  Diagnosis Date  . Arthritis    "all over; mostly knees and back" (02/28/2018)  . CHF (congestive heart failure) (HCC)    not on any meds  . Chronic lower back pain    stenosis  . CKD (chronic kidney disease), stage V ([place])    [name] 02/28/2018  . Coronary atherosclerosis of native coronary artery 2005   s/p surgery  . Drug abuse ([place])    hx; tested for cocaine as recently as 2/08. says he is not using drugs now - avoided defib. for this reason   . GERD (gastroesophageal reflux disease)    takes OTC meds as needed  . Glaucoma    uses eye drops daily  . Hepatitis B 1968   "tx'd w/isolation; caught it from toilet stools in gym"  . History of colon polyps    benign  . History of gout    takes Allopurinol daily as well as Colchicine-if needed (02/28/2018)  . History of kidney stones   . HTN (hypertension)    takes Coreg,Imdur.and Apresoline daily  . Human immunodeficiency virus (HIV) disease ([place]) dx'd 1995   takes Genvoya daily  . Hyperlipidemia    takes Atorvastatin daily  . Ischemic cardiomyopathy   . Muscle spasm    takes Zanaflex as needed  . Myocardial infarction ([place]) ~ 2004/2005  . Nocturia   . Peripheral neuropathy    takes gabapentin daily  . Pneumonia    "at least twice" (02/28/2018)  . Shortness of breath dyspnea    rarely but if notices it then with exertion  . Syphilis, unspecified   . Type II diabetes mellitus ([place]) 2004   Lantus daily.Average  fasting blood sugar 125-199  . Wears glasses   . Wears partial dentures     Past Surgical History:  Procedure Laterality Date  . AV FISTULA PLACEMENT Left 08/02/2018   Procedure: ARTERIOVENOUS (AV) FISTULA CREATION  left arm radiocephlic;  Surgeon: Marty Heck, MD;  Location: [facility];  Service: Vascular;  Laterality: Left;  . BACK SURGERY    . CARDIAC CATHETERIZATION  10/2002; 12/19/2004   [name] 03/08/2011  . COLONOSCOPY  2013   Dr.John Henrene Pastor   . CORONARY ARTERY BYPASS GRAFT  02/24/2003   CABG X2/notes 03/08/2011  . FRACTURE SURGERY    . INTERTROCHANTERIC HIP FRACTURE SURGERY Left 11/2006   [name] 03/08/2011  . LAPAROSCOPIC CHOLECYSTECTOMY  05/2006  . LIGATION OF COMPETING BRANCHES OF ARTERIOVENOUS FISTULA Left 11/05/2018   Procedure: LIGATION OF COMPETING BRANCHES OF ARTERIOVENOUS FISTULA  LEFT  ARM;  Surgeon: Marty Heck, MD;  Location: [facility];  Service: Vascular;  Laterality: Left;  . LUMBAR LAMINECTOMY/DECOMPRESSION MICRODISCECTOMY N/A 02/29/2016   Procedure: Left L4-5 Lateral Recess Decompression, Removal Extradural Intraspinal Facet Cyst;  Surgeon: Marybelle Killings, MD;  Location: [facility];  Service: Orthopedics;  Laterality: N/A;  . MULTIPLE TOOTH EXTRACTIONS    . ORIF MANDIBULAR FRACTURE Left 08/13/2004   ORIF of  left body fracture mandible with KLS Martin 2.3-mm six hole/notes 03/08/2011    Family History  Problem Relation Age of Onset  . Heart failure Father   . Hypertension Father   . Diabetes Brother   . Heart attack Brother   . Alzheimer's disease Mother   . Stroke Sister   . Diabetes Sister   . Alzheimer's disease Sister   . Hypertension Brother   . Diabetes Brother   . Drug abuse Brother   . Colon cancer Neg Hx     SOCIAL HISTORY: Social History   Tobacco Use  . Smoking status: Current Every Day Smoker    Packs/day: 0.50    Years: 43.00    Pack years: 21.50    Types: Cigarettes  . Smokeless tobacco: Never Used  Substance Use Topics  . Alcohol  use: Yes    Comment:  "maybe 2 beers/month"    Allergies  Allergen Reactions  . Augmentin [Amoxicillin-Pot Clavulanate] Diarrhea    Severe diarrhea   . Amphetamines Other (See Comments)    Unknown reaction type    Current Outpatient Medications  Medication Sig Dispense Refill  . acetaminophen (TYLENOL) 500 MG tablet Take 500 mg by mouth every 6 (six) hours as needed for moderate pain.    [name] allopurinol (ZYLOPRIM) 100 MG tablet Take 1 tablet (100 mg total) by mouth daily. 90 tablet 1  . aspirin EC 81 MG tablet Take 81 mg by mouth daily.    [name] atorvastatin (LIPITOR) 80 MG tablet Take 1 tablet (80 mg total) by mouth daily. 90 tablet 3  . B-D UF III MINI PEN NEEDLES 31G X 5 MM MISC use four times a day 100 each 11  . bictegravir-emtricitabine-tenofovir AF (BIKTARVY) 50-200-25 MG TABS tablet Take 1 tablet by mouth daily. 30 tablet 11  . Blood Glucose Monitoring Suppl (ONE TOUCH ULTRA MINI) w/Device KIT 1 each by Does not apply route 2 (two) times daily. Dx: E11.40 1 each 0  . cadexomer iodine (IODOSORB) 0.9 % gel Apply 1 application topically daily as needed for wound care. 40 g 5  . carvedilol (COREG) 25 MG tablet TAKE 1 TABLET BY MOUTH TWICE A DAY WITH MEALS 180 tablet 2  . Cholecalciferol (VITAMIN D3) 50 MCG (2000 UT) TABS Take 2,000 Units by mouth daily.    . Choline Fenofibrate (FENOFIBRIC ACID) 135 MG CPDR Take 1 capsule by mouth daily. 90 capsule 0  . colchicine 0.6 MG tablet Take one tablet by mouth as needed for gout 30 tablet 2  . furosemide (LASIX) 80 MG tablet Take 1.5 tablets (120 mg total) by mouth 2 (two) times daily. 120 tablet 0  . gabapentin (NEURONTIN) 300 MG capsule TAKE 1 CAPSULE BY MOUTH THREE TIMES A DAY 270 capsule 1  . gentamicin cream (GARAMYCIN) 0.1 % Apply 1 application topically daily. Foot 30 g 0  . Glucosamine-Chondroit-Vit C-Mn (GLUCOSAMINE CHONDR 1500 COMPLX PO) Take 2 tablets by mouth daily.     [name] glucose blood test strip Use to test blood sugar three times  daily. E11.40 300 each 3  . hydrALAZINE (APRESOLINE) 25 MG tablet Take 1 tablet (25 mg total) by mouth 3 (three) times daily. 270 tablet 0  . HYDROcodone-acetaminophen (NORCO) 5-325 MG tablet Take 1 tablet by mouth every 6 (six) hours as needed for moderate pain. 15 tablet 0  . insulin lispro (HUMALOG KWIKPEN) 100 UNIT/ML KiwkPen Inject 18 units subcutaneously three times daily before meals 33 mL 3  . isosorbide mononitrate (IMDUR) 30  MG 24 hr tablet Take 1 tablet (30 mg total) by mouth daily. 90 tablet 0  . Lancets (ONETOUCH DELICA PLUS CNOBSJ62E) MISC 1 each by Does not apply route 3 (three) times daily. Use to test blood sugar three times daily. Dx: E11.40 300 each 3  . LANTUS SOLOSTAR 100 UNIT/ML Solostar Pen Inject 74 units subcutaneously every evening 15 mL 6  . latanoprost (XALATAN) 0.005 % ophthalmic solution Place 1 drop into both eyes at bedtime. Reported on 04/04/2016    . nitroGLYCERIN (NITROSTAT) 0.3 MG SL tablet Place 1 tablet (0.3 mg total) under the tongue as needed for chest pain (not to exceed 3 tablets in one day.). 75 tablet 0  . sodium bicarbonate 650 MG tablet Take 650 mg by mouth 2 (two) times daily.   3  . umeclidinium-vilanterol (ANORO ELLIPTA) 62.5-25 MCG/INH AEPB INHALE 1 PUFF BY MOUTH EVERY DAY 60 each 5   No current facility-administered medications for this visit.     REVIEW OF SYSTEMS:  '[X]'  denotes positive finding, '[ ]'  denotes negative finding Cardiac  Comments:  Chest pain or chest pressure:    Shortness of breath upon exertion:    Short of breath when lying flat:    Irregular heart rhythm:        Vascular    Pain in calf, thigh, or hip brought on by ambulation:    Pain in feet at night that wakes you up from your sleep:     Blood clot in your veins:    Leg swelling:         Pulmonary    Oxygen at home:    Productive cough:     Wheezing:         Neurologic    Sudden weakness in arms or legs:     Sudden numbness in arms or legs:     Sudden onset of  difficulty speaking or slurred speech:    Temporary loss of vision in one eye:     Problems with dizziness:         Gastrointestinal    Blood in stool:     Vomited blood:         Genitourinary    Burning when urinating:     Blood in urine:        Psychiatric    Major depression:         Hematologic    Bleeding problems:    Problems with blood clotting too easily:        Skin    Rashes or ulcers:        Constitutional    Fever or chills:      PHYSICAL EXAM: Vitals:   12/18/18 1334  BP: (!) 157/70  Pulse: 66  Resp: 16  SpO2: 98%  Weight: 203 lb 14.8 oz (92.5 kg)  Height: '6\' 1"'  (1.854 m)    GENERAL: The patient is a well-nourished male, in no acute distress. The vital signs are documented above. CARDIAC: There is a regular rate and rhythm.  VASCULAR:  Left radial pulse 2+ Left radiocephalic AVF with good thrill All forearm incisions well healed  DATA:   I reviewed his fistula duplex that shows the proximal forearm/AC fossa area is now 5 to 8 mm and is dilated more since sidebranch ligation the distal forearm vein still remains small around 3 to 4 mm.  Assessment/Plan:  Discussed with Jacob [name] in detail that after sidebranch ligation his proximal forearm and AC fossa portion  of the outflow cephalic vein as shown increased dilation and measures 5 to 8 mm whereas it was previously 3 mm.  He has a great thrill in the proximal forearm.  I do not see any further role for revision at this time unless there are issues either accessing the fistula or with flow volumes at which time he should probably be converted to a brachiocephalic.  There is no evidence of stenosis or any other issue that would require revision at this time.   Marty Heck, MD [facility] Office: [phone] Pager: [place]

## 2018-12-20 DIAGNOSIS — E109 Type 1 diabetes mellitus without complications: Secondary | ICD-10-CM | POA: Diagnosis not present

## 2018-12-31 ENCOUNTER — Encounter: Payer: Self-pay | Admitting: Podiatry

## 2018-12-31 ENCOUNTER — Ambulatory Visit (INDEPENDENT_AMBULATORY_CARE_PROVIDER_SITE_OTHER): Payer: Medicare Other | Admitting: Podiatry

## 2018-12-31 DIAGNOSIS — B351 Tinea unguium: Secondary | ICD-10-CM | POA: Diagnosis not present

## 2018-12-31 DIAGNOSIS — L97522 Non-pressure chronic ulcer of other part of left foot with fat layer exposed: Secondary | ICD-10-CM | POA: Diagnosis not present

## 2018-12-31 DIAGNOSIS — E0842 Diabetes mellitus due to underlying condition with diabetic polyneuropathy: Secondary | ICD-10-CM | POA: Diagnosis not present

## 2018-12-31 DIAGNOSIS — M79676 Pain in unspecified toe(s): Secondary | ICD-10-CM | POA: Diagnosis not present

## 2018-12-31 DIAGNOSIS — M79609 Pain in unspecified limb: Secondary | ICD-10-CM

## 2019-01-03 NOTE — Progress Notes (Signed)
Subjective:  68 year old male presenting today for follow up evaluation of an ulceration of the right hallux. He states he is doing well and the wound is healing appropriately. He is complaining of elongated, thickened nails of bilateral feet that cause pain while ambulating in shoes. He is unable to trim his own nails. Patient is here for further evaluation and treatment.   Past Medical History:  Diagnosis Date   Arthritis    "all over; mostly knees and back" (02/28/2018)   CHF (congestive heart failure) (HCC)    not on any meds   Chronic lower back pain    stenosis   CKD (chronic kidney disease), stage V ([place])    [name] 02/28/2018   Coronary atherosclerosis of native coronary artery 2005   s/p surgery   Drug abuse ([place])    hx; tested for cocaine as recently as 2/08. says he is not using drugs now - avoided defib. for this reason    GERD (gastroesophageal reflux disease)    takes OTC meds as needed   Glaucoma    uses eye drops daily   Hepatitis B 1968   "tx'd w/isolation; caught it from toilet stools in gym"   History of colon polyps    benign   History of gout    takes Allopurinol daily as well as Colchicine-if needed (02/28/2018)   History of kidney stones    HTN (hypertension)    takes Coreg,Imdur.and Apresoline daily   Human immunodeficiency virus (HIV) disease ([place]) dx'd 1995   takes Genvoya daily   Hyperlipidemia    takes Atorvastatin daily   Ischemic cardiomyopathy    Muscle spasm    takes Zanaflex as needed   Myocardial infarction ([place]) ~ 2004/2005   Nocturia    Peripheral neuropathy    takes gabapentin daily   Pneumonia    "at least twice" (02/28/2018)   Shortness of breath dyspnea    rarely but if notices it then with exertion   Syphilis, unspecified    Type II diabetes mellitus ([place]) 2004   Lantus daily.Average fasting blood sugar 125-199   Wears glasses    Wears partial dentures     Objective/Physical Exam General: The patient  is alert and oriented x3 in no acute distress.  Dermatology:  Wound #1 noted to the right hallux measuring 0.3 x 0.3 x 0.2 cm (LxWxD).   To the noted ulceration(s), there is no eschar. There is a moderate amount of slough, fibrin, and necrotic tissue noted. Granulation tissue and wound base is red. There is a minimal amount of serosanguineous drainage noted. There is no exposed bone muscle-tendon ligament or joint. There is no malodor. Periwound integrity is intact. Skin is warm, dry and supple bilateral lower extremities.  Vascular: Palpable pedal pulses bilaterally. No edema or erythema noted. Capillary refill within normal limits.  Neurological: Epicritic and protective threshold diminished bilaterally.   Musculoskeletal Exam: Range of motion within normal limits to all pedal and ankle joints bilateral. Muscle strength 5/5 in all groups bilateral.   Assessment: #1 ulceration of the right hallux secondary to diabetes mellitus #2 diabetes mellitus w/ peripheral neuropathy #3 Onychomycosis of nail due to dermatophyte bilateral   Plan of Care:  #1 Patient was evaluated. #2 medically necessary excisional debridement including subcutaneous tissue was performed using a tissue nipper and a chisel blade. Excisional debridement of all the necrotic nonviable tissue down to healthy bleeding viable tissue was performed with post-debridement measurements same as pre-. #3 the wound was cleansed  and dry sterile dressing applied. #4 Continue using Gentamicin cream daily with a bandage.  #5 Continue wearing DM shoes.  #6 Mechanical debridement of nails 1-5 bilaterally performed using a nail nipper. Filed with dremel without incident.  #7 Return to clinic in 4 weeks.    Edrick Kins, DPM [facility]  Dr. Edrick Kins, [address]                                        [place], [place] [address]                Office [phone]  Fax [phone]

## 2019-01-15 ENCOUNTER — Other Ambulatory Visit: Payer: Self-pay | Admitting: *Deleted

## 2019-01-15 ENCOUNTER — Other Ambulatory Visit: Payer: Self-pay | Admitting: Nurse Practitioner

## 2019-01-15 DIAGNOSIS — I5042 Chronic combined systolic (congestive) and diastolic (congestive) heart failure: Secondary | ICD-10-CM

## 2019-01-15 DIAGNOSIS — M109 Gout, unspecified: Secondary | ICD-10-CM

## 2019-01-15 DIAGNOSIS — J449 Chronic obstructive pulmonary disease, unspecified: Secondary | ICD-10-CM

## 2019-01-15 MED ORDER — ALLOPURINOL 100 MG PO TABS: 100.0000 mg | ORAL_TABLET | Freq: Every day | ORAL | 1 refills | Status: DC

## 2019-01-15 MED ORDER — UMECLIDINIUM-VILANTEROL 62.5-25 MCG/INH IN AEPB: INHALATION_SPRAY | RESPIRATORY_TRACT | 5 refills | Status: DC

## 2019-01-19 ENCOUNTER — Other Ambulatory Visit: Payer: Self-pay | Admitting: Nurse Practitioner

## 2019-01-19 DIAGNOSIS — E1169 Type 2 diabetes mellitus with other specified complication: Secondary | ICD-10-CM

## 2019-01-19 DIAGNOSIS — E785 Hyperlipidemia, unspecified: Principal | ICD-10-CM

## 2019-01-21 ENCOUNTER — Other Ambulatory Visit: Payer: Self-pay | Admitting: *Deleted

## 2019-01-21 DIAGNOSIS — I10 Essential (primary) hypertension: Secondary | ICD-10-CM

## 2019-01-21 MED ORDER — HYDRALAZINE HCL 25 MG PO TABS: 25.0000 mg | ORAL_TABLET | Freq: Three times a day (TID) | ORAL | 0 refills | Status: DC

## 2019-01-21 NOTE — Telephone Encounter (Signed)
Patient requested refill.  Rx sent to pharmacy.

## 2019-02-04 ENCOUNTER — Encounter: Payer: Self-pay | Admitting: Podiatry

## 2019-02-04 ENCOUNTER — Ambulatory Visit (INDEPENDENT_AMBULATORY_CARE_PROVIDER_SITE_OTHER): Payer: Medicare Other | Admitting: Podiatry

## 2019-02-04 ENCOUNTER — Other Ambulatory Visit: Payer: Self-pay

## 2019-02-04 VITALS — Temp 97.7°F

## 2019-02-04 DIAGNOSIS — L989 Disorder of the skin and subcutaneous tissue, unspecified: Secondary | ICD-10-CM

## 2019-02-04 DIAGNOSIS — L97522 Non-pressure chronic ulcer of other part of left foot with fat layer exposed: Secondary | ICD-10-CM

## 2019-02-04 DIAGNOSIS — E0842 Diabetes mellitus due to underlying condition with diabetic polyneuropathy: Secondary | ICD-10-CM | POA: Diagnosis not present

## 2019-02-04 NOTE — Progress Notes (Signed)
Subjective:  68 year old male presenting today for follow up evaluation of an ulceration of the left hallux.  Patient believes the ulcer has healed.  He says that there is no drainage. Patient also complains of a chronic recurrent symptomatic callus lesion noted to the right plantar forefoot.  Trimming helps alleviate his symptoms significantly.  He continues to wear his diabetic shoes  Past Medical History:  Diagnosis Date  . Arthritis    "all over; mostly knees and back" (02/28/2018)  . CHF (congestive heart failure) (HCC)    not on any meds  . Chronic lower back pain    stenosis  . CKD (chronic kidney disease), stage V ([place])    [name] 02/28/2018  . Coronary atherosclerosis of native coronary artery 2005   s/p surgery  . Drug abuse ([place])    hx; tested for cocaine as recently as 2/08. says he is not using drugs now - avoided defib. for this reason   . GERD (gastroesophageal reflux disease)    takes OTC meds as needed  . Glaucoma    uses eye drops daily  . Hepatitis B 1968   "tx'd w/isolation; caught it from toilet stools in gym"  . History of colon polyps    benign  . History of gout    takes Allopurinol daily as well as Colchicine-if needed (02/28/2018)  . History of kidney stones   . HTN (hypertension)    takes Coreg,Imdur.and Apresoline daily  . Human immunodeficiency virus (HIV) disease ([place]) dx'd 1995   takes Genvoya daily  . Hyperlipidemia    takes Atorvastatin daily  . Ischemic cardiomyopathy   . Muscle spasm    takes Zanaflex as needed  . Myocardial infarction ([place]) ~ 2004/2005  . Nocturia   . Peripheral neuropathy    takes gabapentin daily  . Pneumonia    "at least twice" (02/28/2018)  . Shortness of breath dyspnea    rarely but if notices it then with exertion  . Syphilis, unspecified   . Type II diabetes mellitus ([place]) 2004   Lantus daily.Average fasting blood sugar 125-199  . Wears glasses   . Wears partial dentures     Objective/Physical Exam General:  The patient is alert and oriented x3 in no acute distress.  Dermatology:  Wound #1 noted to the right hallux measuring 2.3 x 1.0 x 0.2 cm (LxWxD).   After debridement of the overlying callus tissue , there was some localized purulent drainage.  there is no eschar. There is a moderate amount of slough, fibrin, and necrotic tissue noted. Granulation tissue and wound base is red. There is a minimal amount of serosanguineous drainage noted. There is no exposed bone muscle-tendon ligament or joint. There is no malodor. Periwound integrity is intact. Hyperkeratotic pre-ulcerative callus lesion noted to the right plantar forefoot with pain on palpation. Skin is warm, dry and supple bilateral lower extremities.  Vascular: Palpable pedal pulses bilaterally. No edema or erythema noted. Capillary refill within normal limits.  Neurological: Epicritic and protective threshold diminished bilaterally.   Musculoskeletal Exam: Range of motion within normal limits to all pedal and ankle joints bilateral. Muscle strength 5/5 in all groups bilateral.   Assessment: #1 ulceration of the right hallux secondary to diabetes mellitus #2 diabetes mellitus w/ peripheral neuropathy #3  Pre-ulcerative callus right plantar forefoot   Plan of Care:  #1 Patient was evaluated. #2 medically necessary excisional debridement including subcutaneous tissue was performed using a tissue nipper and a chisel blade. Excisional debridement of  all the necrotic nonviable tissue down to healthy bleeding viable tissue was performed with post-debridement measurements same as pre-. #3 the wound was cleansed and dry sterile dressing applied. #4  Discontinue gentamicin cream.  Recommend Betadine ointment and a Band-Aid daily. #5 Continue wearing DM shoes.  #6  Excisional debridement of the hyperkeratotic callus tissue was performed using a chisel blade without incident or bleeding. #7 Return to clinic in 3 weeks for follow-up evaluation of  the left great toe ulceration.    Edrick Kins, DPM [facility]  Dr. Edrick Kins, [address]                                        [place], [place] [address]                Office [phone]  Fax [phone]

## 2019-02-06 ENCOUNTER — Encounter (HOSPITAL_COMMUNITY): Payer: Self-pay | Admitting: Emergency Medicine

## 2019-02-06 ENCOUNTER — Other Ambulatory Visit: Payer: Self-pay

## 2019-02-06 ENCOUNTER — Inpatient Hospital Stay (HOSPITAL_COMMUNITY)
Admission: EM | Admit: 2019-02-06 | Discharge: 2019-02-09 | DRG: 378 | Disposition: A | Discharge status: Home / Self Care | Payer: Medicare Other | Attending: Internal Medicine | Admitting: Internal Medicine

## 2019-02-06 ENCOUNTER — Emergency Department (HOSPITAL_COMMUNITY): Payer: Medicare Other

## 2019-02-06 DIAGNOSIS — I2510 Atherosclerotic heart disease of native coronary artery without angina pectoris: Secondary | ICD-10-CM | POA: Diagnosis present

## 2019-02-06 DIAGNOSIS — M545 Low back pain: Secondary | ICD-10-CM | POA: Diagnosis present

## 2019-02-06 DIAGNOSIS — I10 Essential (primary) hypertension: Secondary | ICD-10-CM | POA: Diagnosis present

## 2019-02-06 DIAGNOSIS — E1122 Type 2 diabetes mellitus with diabetic chronic kidney disease: Secondary | ICD-10-CM | POA: Diagnosis present

## 2019-02-06 DIAGNOSIS — Z823 Family history of stroke: Secondary | ICD-10-CM

## 2019-02-06 DIAGNOSIS — Z813 Family history of other psychoactive substance abuse and dependence: Secondary | ICD-10-CM

## 2019-02-06 DIAGNOSIS — G8929 Other chronic pain: Secondary | ICD-10-CM | POA: Diagnosis present

## 2019-02-06 DIAGNOSIS — I4510 Unspecified right bundle-branch block: Secondary | ICD-10-CM | POA: Diagnosis present

## 2019-02-06 DIAGNOSIS — I255 Ischemic cardiomyopathy: Secondary | ICD-10-CM | POA: Diagnosis present

## 2019-02-06 DIAGNOSIS — E1142 Type 2 diabetes mellitus with diabetic polyneuropathy: Secondary | ICD-10-CM | POA: Diagnosis present

## 2019-02-06 DIAGNOSIS — M109 Gout, unspecified: Secondary | ICD-10-CM | POA: Diagnosis present

## 2019-02-06 DIAGNOSIS — R7989 Other specified abnormal findings of blood chemistry: Secondary | ICD-10-CM

## 2019-02-06 DIAGNOSIS — D500 Iron deficiency anemia secondary to blood loss (chronic): Secondary | ICD-10-CM | POA: Diagnosis present

## 2019-02-06 DIAGNOSIS — Z87442 Personal history of urinary calculi: Secondary | ICD-10-CM

## 2019-02-06 DIAGNOSIS — L97519 Non-pressure chronic ulcer of other part of right foot with unspecified severity: Secondary | ICD-10-CM | POA: Diagnosis present

## 2019-02-06 DIAGNOSIS — Z833 Family history of diabetes mellitus: Secondary | ICD-10-CM

## 2019-02-06 DIAGNOSIS — N184 Chronic kidney disease, stage 4 (severe): Secondary | ICD-10-CM

## 2019-02-06 DIAGNOSIS — K921 Melena: Principal | ICD-10-CM | POA: Diagnosis present

## 2019-02-06 DIAGNOSIS — E11621 Type 2 diabetes mellitus with foot ulcer: Secondary | ICD-10-CM | POA: Diagnosis present

## 2019-02-06 DIAGNOSIS — D649 Anemia, unspecified: Secondary | ICD-10-CM | POA: Diagnosis not present

## 2019-02-06 DIAGNOSIS — I252 Old myocardial infarction: Secondary | ICD-10-CM

## 2019-02-06 DIAGNOSIS — Z820 Family history of epilepsy and other diseases of the nervous system: Secondary | ICD-10-CM

## 2019-02-06 DIAGNOSIS — E785 Hyperlipidemia, unspecified: Secondary | ICD-10-CM | POA: Diagnosis present

## 2019-02-06 DIAGNOSIS — Q2730 Arteriovenous malformation, site unspecified: Secondary | ICD-10-CM

## 2019-02-06 DIAGNOSIS — K2981 Duodenitis with bleeding: Secondary | ICD-10-CM | POA: Diagnosis present

## 2019-02-06 DIAGNOSIS — G629 Polyneuropathy, unspecified: Secondary | ICD-10-CM | POA: Diagnosis present

## 2019-02-06 DIAGNOSIS — Z794 Long term (current) use of insulin: Secondary | ICD-10-CM

## 2019-02-06 DIAGNOSIS — F17210 Nicotine dependence, cigarettes, uncomplicated: Secondary | ICD-10-CM | POA: Diagnosis present

## 2019-02-06 DIAGNOSIS — I132 Hypertensive heart and chronic kidney disease with heart failure and with stage 5 chronic kidney disease, or end stage renal disease: Secondary | ICD-10-CM | POA: Diagnosis present

## 2019-02-06 DIAGNOSIS — Z8719 Personal history of other diseases of the digestive system: Secondary | ICD-10-CM

## 2019-02-06 DIAGNOSIS — Z951 Presence of aortocoronary bypass graft: Secondary | ICD-10-CM

## 2019-02-06 DIAGNOSIS — K922 Gastrointestinal hemorrhage, unspecified: Secondary | ICD-10-CM

## 2019-02-06 DIAGNOSIS — N185 Chronic kidney disease, stage 5: Secondary | ICD-10-CM

## 2019-02-06 DIAGNOSIS — B20 Human immunodeficiency virus [HIV] disease: Secondary | ICD-10-CM | POA: Diagnosis present

## 2019-02-06 DIAGNOSIS — Z7982 Long term (current) use of aspirin: Secondary | ICD-10-CM

## 2019-02-06 DIAGNOSIS — Z79891 Long term (current) use of opiate analgesic: Secondary | ICD-10-CM

## 2019-02-06 DIAGNOSIS — Z8249 Family history of ischemic heart disease and other diseases of the circulatory system: Secondary | ICD-10-CM

## 2019-02-06 DIAGNOSIS — Z79899 Other long term (current) drug therapy: Secondary | ICD-10-CM

## 2019-02-06 DIAGNOSIS — I5042 Chronic combined systolic (congestive) and diastolic (congestive) heart failure: Secondary | ICD-10-CM | POA: Diagnosis present

## 2019-02-06 DIAGNOSIS — K219 Gastro-esophageal reflux disease without esophagitis: Secondary | ICD-10-CM | POA: Diagnosis present

## 2019-02-06 DIAGNOSIS — I248 Other forms of acute ischemic heart disease: Secondary | ICD-10-CM | POA: Diagnosis present

## 2019-02-06 DIAGNOSIS — K31819 Angiodysplasia of stomach and duodenum without bleeding: Secondary | ICD-10-CM | POA: Diagnosis present

## 2019-02-06 DIAGNOSIS — E1159 Type 2 diabetes mellitus with other circulatory complications: Secondary | ICD-10-CM | POA: Diagnosis present

## 2019-02-06 DIAGNOSIS — M1990 Unspecified osteoarthritis, unspecified site: Secondary | ICD-10-CM | POA: Diagnosis present

## 2019-02-06 DIAGNOSIS — L97509 Non-pressure chronic ulcer of other part of unspecified foot with unspecified severity: Secondary | ICD-10-CM | POA: Diagnosis present

## 2019-02-06 HISTORY — DX: Gastrointestinal hemorrhage, unspecified: K92.2

## 2019-02-06 LAB — URINALYSIS, ROUTINE W REFLEX MICROSCOPIC
Bilirubin Urine: NEGATIVE
Glucose, UA: 50 mg/dL — AB
Hgb urine dipstick: NEGATIVE
Ketones, ur: NEGATIVE mg/dL
Leukocytes,Ua: NEGATIVE
Nitrite: NEGATIVE
Protein, ur: NEGATIVE mg/dL
Specific Gravity, Urine: 1.009 (ref 1.005–1.030)
pH: 5 (ref 5.0–8.0)

## 2019-02-06 LAB — CBC WITH DIFFERENTIAL/PLATELET
Abs Immature Granulocytes: 0.02 10*3/uL (ref 0.00–0.07)
Basophils Absolute: 0.1 10*3/uL (ref 0.0–0.1)
Basophils Relative: 1 %
Eosinophils Absolute: 0.2 10*3/uL (ref 0.0–0.5)
Eosinophils Relative: 3 %
HCT: 20.3 % — ABNORMAL LOW (ref 39.0–52.0)
Hemoglobin: 6.4 g/dL — CL (ref 13.0–17.0)
Immature Granulocytes: 0 %
Lymphocytes Relative: 38 %
Lymphs Abs: 2.2 10*3/uL (ref 0.7–4.0)
MCH: 30.3 pg (ref 26.0–34.0)
MCHC: 31.5 g/dL (ref 30.0–36.0)
MCV: 96.2 fL (ref 80.0–100.0)
Monocytes Absolute: 0.6 10*3/uL (ref 0.1–1.0)
Monocytes Relative: 10 %
Neutro Abs: 2.7 10*3/uL (ref 1.7–7.7)
Neutrophils Relative %: 48 %
Platelets: 199 10*3/uL (ref 150–400)
RBC: 2.11 MIL/uL — ABNORMAL LOW (ref 4.22–5.81)
RDW: 14 % (ref 11.5–15.5)
WBC: 5.8 10*3/uL (ref 4.0–10.5)
nRBC: 0 % (ref 0.0–0.2)

## 2019-02-06 LAB — COMPREHENSIVE METABOLIC PANEL
ALT: 20 U/L (ref 0–44)
AST: 25 U/L (ref 15–41)
Albumin: 3.3 g/dL — ABNORMAL LOW (ref 3.5–5.0)
Alkaline Phosphatase: 36 U/L — ABNORMAL LOW (ref 38–126)
Anion gap: 13 (ref 5–15)
BUN: 99 mg/dL — ABNORMAL HIGH (ref 8–23)
CO2: 17 mmol/L — ABNORMAL LOW (ref 22–32)
Calcium: 8.8 mg/dL — ABNORMAL LOW (ref 8.9–10.3)
Chloride: 108 mmol/L (ref 98–111)
Creatinine, Ser: 5.74 mg/dL — ABNORMAL HIGH (ref 0.61–1.24)
GFR calc Af Amer: 11 mL/min — ABNORMAL LOW (ref >60)
GFR calc non Af Amer: 9 mL/min — ABNORMAL LOW (ref >60)
Glucose, Bld: 263 mg/dL — ABNORMAL HIGH (ref 70–99)
Potassium: 4.5 mmol/L (ref 3.5–5.1)
Sodium: 138 mmol/L (ref 135–145)
Total Bilirubin: 0.5 mg/dL (ref 0.3–1.2)
Total Protein: 7.1 g/dL (ref 6.5–8.1)

## 2019-02-06 LAB — RETICULOCYTES
Immature Retic Fract: 25.3 % — ABNORMAL HIGH (ref 2.3–15.9)
RBC.: 2.49 MIL/uL — ABNORMAL LOW (ref 4.22–5.81)
Retic Count, Absolute: 67.5 10*3/uL (ref 19.0–186.0)
Retic Ct Pct: 2.7 % (ref 0.4–3.1)

## 2019-02-06 LAB — BRAIN NATRIURETIC PEPTIDE: B Natriuretic Peptide: 852.1 pg/mL — ABNORMAL HIGH (ref 0.0–100.0)

## 2019-02-06 LAB — TROPONIN I
Troponin I: 0.48 ng/mL (ref <0.03)
Troponin I: 0.73 ng/mL (ref <0.03)

## 2019-02-06 LAB — LIPASE, BLOOD: Lipase: 110 U/L — ABNORMAL HIGH (ref 11–51)

## 2019-02-06 LAB — GLUCOSE, CAPILLARY
Glucose-Capillary: 163 mg/dL — ABNORMAL HIGH (ref 70–99)
Glucose-Capillary: 185 mg/dL — ABNORMAL HIGH (ref 70–99)

## 2019-02-06 LAB — IRON AND TIBC
Iron: 250 ug/dL — ABNORMAL HIGH (ref 45–182)
Saturation Ratios: 47 % — ABNORMAL HIGH (ref 17.9–39.5)
TIBC: 528 ug/dL — ABNORMAL HIGH (ref 250–450)
UIBC: 278 ug/dL

## 2019-02-06 LAB — FOLATE: Folate: 7.9 ng/mL (ref >5.9)

## 2019-02-06 LAB — VITAMIN B12: Vitamin B-12: 251 pg/mL (ref 180–914)

## 2019-02-06 LAB — PREPARE RBC (CROSSMATCH)

## 2019-02-06 LAB — FERRITIN: Ferritin: 10 ng/mL — ABNORMAL LOW (ref 24–336)

## 2019-02-06 LAB — HEMOGLOBIN AND HEMATOCRIT, BLOOD
HCT: 22.7 % — ABNORMAL LOW (ref 39.0–52.0)
Hemoglobin: 7.5 g/dL — ABNORMAL LOW (ref 13.0–17.0)

## 2019-02-06 LAB — POC OCCULT BLOOD, ED: Fecal Occult Bld: NEGATIVE

## 2019-02-06 MED ORDER — INSULIN GLARGINE 100 UNIT/ML ~~LOC~~ SOLN
50.0000 [IU] | Freq: Every day | SUBCUTANEOUS | Status: DC
  Administered 2019-02-07 – 2019-02-09 (×3): 50 [IU] via SUBCUTANEOUS
  Filled 2019-02-06 (×5): qty 0.5

## 2019-02-06 MED ORDER — GABAPENTIN 300 MG PO CAPS
300.0000 mg | ORAL_CAPSULE | Freq: Three times a day (TID) | ORAL | Status: DC
  Administered 2019-02-06 – 2019-02-09 (×9): 300 mg via ORAL
  Filled 2019-02-06 (×9): qty 1

## 2019-02-06 MED ORDER — INSULIN ASPART 100 UNIT/ML ~~LOC~~ SOLN
0.0000 [IU] | Freq: Three times a day (TID) | SUBCUTANEOUS | Status: DC
  Administered 2019-02-06: 2 [IU] via SUBCUTANEOUS
  Administered 2019-02-07: 7 [IU] via SUBCUTANEOUS
  Administered 2019-02-07 – 2019-02-08 (×2): 1 [IU] via SUBCUTANEOUS
  Administered 2019-02-08: 3 [IU] via SUBCUTANEOUS
  Administered 2019-02-09: 1 [IU] via SUBCUTANEOUS

## 2019-02-06 MED ORDER — ATORVASTATIN CALCIUM 80 MG PO TABS
80.0000 mg | ORAL_TABLET | Freq: Every day | ORAL | Status: DC
  Administered 2019-02-06 – 2019-02-08 (×3): 80 mg via ORAL
  Filled 2019-02-06 (×3): qty 1

## 2019-02-06 MED ORDER — ALLOPURINOL 100 MG PO TABS
100.0000 mg | ORAL_TABLET | Freq: Every day | ORAL | Status: DC
  Administered 2019-02-07 – 2019-02-09 (×3): 100 mg via ORAL
  Filled 2019-02-06 (×3): qty 1

## 2019-02-06 MED ORDER — ISOSORBIDE MONONITRATE ER 30 MG PO TB24
30.0000 mg | ORAL_TABLET | Freq: Every day | ORAL | Status: DC
  Administered 2019-02-07 – 2019-02-09 (×3): 30 mg via ORAL
  Filled 2019-02-06 (×3): qty 1

## 2019-02-06 MED ORDER — CARVEDILOL 25 MG PO TABS
25.0000 mg | ORAL_TABLET | Freq: Two times a day (BID) | ORAL | Status: DC
  Administered 2019-02-06 – 2019-02-09 (×6): 25 mg via ORAL
  Filled 2019-02-06: qty 2
  Filled 2019-02-06 (×3): qty 1

## 2019-02-06 MED ORDER — UMECLIDINIUM-VILANTEROL 62.5-25 MCG/INH IN AEPB
1.0000 | INHALATION_SPRAY | Freq: Every day | RESPIRATORY_TRACT | Status: DC
  Administered 2019-02-07 – 2019-02-08 (×2): 1 via RESPIRATORY_TRACT
  Filled 2019-02-06: qty 14

## 2019-02-06 MED ORDER — BICTEGRAVIR-EMTRICITAB-TENOFOV 50-200-25 MG PO TABS
1.0000 | ORAL_TABLET | Freq: Every day | ORAL | Status: DC
  Administered 2019-02-07 – 2019-02-09 (×3): 1 via ORAL
  Filled 2019-02-06 (×4): qty 1

## 2019-02-06 MED ORDER — SODIUM CHLORIDE 0.9% IV SOLUTION: Freq: Once | INTRAVENOUS | Status: DC

## 2019-02-06 MED ORDER — ONDANSETRON HCL 4 MG/2ML IJ SOLN: 4.0000 mg | Freq: Four times a day (QID) | INTRAMUSCULAR | Status: DC | PRN

## 2019-02-06 MED ORDER — INSULIN ASPART 100 UNIT/ML ~~LOC~~ SOLN
0.0000 [IU] | Freq: Every day | SUBCUTANEOUS | Status: DC
  Administered 2019-02-07: 2 [IU] via SUBCUTANEOUS
  Administered 2019-02-08: 3 [IU] via SUBCUTANEOUS

## 2019-02-06 MED ORDER — ONDANSETRON HCL 4 MG PO TABS: 4.0000 mg | ORAL_TABLET | Freq: Four times a day (QID) | ORAL | Status: DC | PRN

## 2019-02-06 MED ORDER — FUROSEMIDE 80 MG PO TABS
120.0000 mg | ORAL_TABLET | Freq: Two times a day (BID) | ORAL | Status: DC
  Administered 2019-02-06 – 2019-02-09 (×6): 120 mg via ORAL
  Filled 2019-02-06 (×6): qty 1

## 2019-02-06 MED ORDER — ACETAMINOPHEN 500 MG PO TABS: 500.0000 mg | ORAL_TABLET | Freq: Four times a day (QID) | ORAL | Status: DC | PRN

## 2019-02-06 MED ORDER — HYDRALAZINE HCL 25 MG PO TABS
25.0000 mg | ORAL_TABLET | Freq: Three times a day (TID) | ORAL | Status: DC
  Administered 2019-02-06 – 2019-02-09 (×8): 25 mg via ORAL
  Filled 2019-02-06 (×8): qty 1

## 2019-02-06 MED ORDER — HYDROCODONE-ACETAMINOPHEN 5-325 MG PO TABS: 1.0000 | ORAL_TABLET | Freq: Four times a day (QID) | ORAL | Status: DC | PRN

## 2019-02-06 MED ORDER — PANTOPRAZOLE SODIUM 40 MG IV SOLR
40.0000 mg | Freq: Two times a day (BID) | INTRAVENOUS | Status: DC
  Administered 2019-02-06 – 2019-02-09 (×7): 40 mg via INTRAVENOUS
  Filled 2019-02-06 (×7): qty 40

## 2019-02-06 NOTE — Consult Note (Addendum)
Referring Provider: [facility]   Primary Care Physician:  [name], NP Primary Gastroenterologist: Scarlette Shorts, MD     Reason for Consultation:    Anemia, black stool   ASSESSMENT / PLAN:    65. 68 year old male with multiple medical problems as outlined below, presenting with acute anemia, intermittent black stool over last two weeks.  For PT negative in the ED but hemoglobin down approximately 6 grams.  Rule out PUD, intestinal AVMs given history of CKD.  -Patient will need EGD, possibly tomorrow if there are no concerns for cardiac event given elevated troponin. -He is receiving packed red blood cells now -Trend H&H, continue to transfuse as needed  2. CAD / CABG. Elevated troponin, question demand ischemia.  Has been having some left-sided chest pain though that is clearly described as radiating from left lower back around abdomen and into chest so not sure this is cardiac related. Non-specific ST changes on EKG. Troponin being cycled.   3. HIV, on Biktarvy  4. Hx of illicit drug use (cocaine)  HPI:   Jacob [name] is a 68 y.o. male with multiple medical problems not limited to CKD 5, hypertension, DM 2, CAD/ CABG, CHF, HIV.  He had placement of a fistula a year ago for dialysis but has not yet started.  Patient presented to the ED augmentation of PCP for evaluation of anemia.  His hemoglobin had dropped from baseline of ~ 12 -13 to 6.4.  He admits to black stool on a few occasions last week and this week he also had 2 black bowel movements but says stools now back to normal.  He has recently developed shortness of breath, becomes winded even walking across the street.  He also describes left lower back pain anytime that he is walking.  Once he sits down the pain seems to radiate around to the abdomen up into his left chest.  He takes a fair amount of Tylenol but denies NSAID use other than daily aspirin.  He does not take an acid blocker on a regular basis just as  needed for heartburn.  No history of peptic ulcer disease.  He denies nausea or vomiting.  Weight stable at home.  ED evaluation: Hemodynamically stable.  White count 8.8, hemoglobin 6.4, MCV 96, platelets 199.  Glucose 263, BUN 99, creatinine 5.74, lipase 110, liver test unremarkable.  BNP 852, troponin 0.48 Chest x-ray without acute findings  EKG:  Sinus rhythm Probable left atrial enlargement Right bundle branch block Nonspecific T abnormalities, lateral leads  Endoscopic evaluation: Screening colonoscopy December 2013.  Findings included moderate diverticulosis.  A diminutive polyp was removed but not able to be retrieved.  Repeat colonoscopy recommended in 10 years  Past Medical History:  Diagnosis Date  . Arthritis    "all over; mostly knees and back" (02/28/2018)  . CHF (congestive heart failure) (HCC)    not on any meds  . Chronic lower back pain    stenosis  . CKD (chronic kidney disease), stage V ([place])    [name] 02/28/2018  . Coronary atherosclerosis of native coronary artery 2005   s/p surgery  . Drug abuse ([place])    hx; tested for cocaine as recently as 2/08. says he is not using drugs now - avoided defib. for this reason   . GERD (gastroesophageal reflux disease)    takes OTC meds as needed  . GI bleeding 02/06/2019  . Glaucoma    uses eye drops daily  . Hepatitis B  1968   "tx'd w/isolation; caught it from toilet stools in gym"  . History of colon polyps    benign  . History of gout    takes Allopurinol daily as well as Colchicine-if needed (02/28/2018)  . History of kidney stones   . HTN (hypertension)    takes Coreg,Imdur.and Apresoline daily  . Human immunodeficiency virus (HIV) disease ([place]) dx'd 1995   takes Genvoya daily  . Hyperlipidemia    takes Atorvastatin daily  . Ischemic cardiomyopathy   . Muscle spasm    takes Zanaflex as needed  . Myocardial infarction ([place]) ~ 2004/2005  . Nocturia   . Peripheral neuropathy    takes gabapentin daily  .  Pneumonia    "at least twice" (02/28/2018)  . Shortness of breath dyspnea    rarely but if notices it then with exertion  . Syphilis, unspecified   . Type II diabetes mellitus ([place]) 2004   Lantus daily.Average fasting blood sugar 125-199  . Wears glasses   . Wears partial dentures     Past Surgical History:  Procedure Laterality Date  . AV FISTULA PLACEMENT Left 08/02/2018   Procedure: ARTERIOVENOUS (AV) FISTULA CREATION  left arm radiocephlic;  Surgeon: Marty Heck, MD;  Location: [place];  Service: Vascular;  Laterality: Left;  . BACK SURGERY    . CARDIAC CATHETERIZATION  10/2002; 12/19/2004   [name] 03/08/2011  . COLONOSCOPY  2013   Dr.John Henrene Pastor   . CORONARY ARTERY BYPASS GRAFT  02/24/2003   CABG X2/notes 03/08/2011  . FRACTURE SURGERY    . INTERTROCHANTERIC HIP FRACTURE SURGERY Left 11/2006   [name] 03/08/2011  . LAPAROSCOPIC CHOLECYSTECTOMY  05/2006  . LIGATION OF COMPETING BRANCHES OF ARTERIOVENOUS FISTULA Left 11/05/2018   Procedure: LIGATION OF COMPETING BRANCHES OF ARTERIOVENOUS FISTULA  LEFT  ARM;  Surgeon: Marty Heck, MD;  Location: [facility];  Service: Vascular;  Laterality: Left;  . LUMBAR LAMINECTOMY/DECOMPRESSION MICRODISCECTOMY N/A 02/29/2016   Procedure: Left L4-5 Lateral Recess Decompression, Removal Extradural Intraspinal Facet Cyst;  Surgeon: Marybelle Killings, MD;  Location: [facility];  Service: Orthopedics;  Laterality: N/A;  . MULTIPLE TOOTH EXTRACTIONS    . ORIF MANDIBULAR FRACTURE Left 08/13/2004   ORIF of left body fracture mandible with KLS Martin 2.3-mm six hole/notes 03/08/2011    Prior to Admission medications   Medication Sig Start Date End Date Taking? Authorizing Provider  acetaminophen (TYLENOL) 500 MG tablet Take 500 mg by mouth every 6 (six) hours as needed for moderate pain.   Yes [provider]  allopurinol (ZYLOPRIM) 100 MG tablet Take 1 tablet (100 mg total) by mouth daily. 01/15/19  Yes [name], NP  aspirin EC 81 MG tablet  Take 81 mg by mouth daily.   Yes [provider]  atorvastatin (LIPITOR) 80 MG tablet Take 1 tablet (80 mg total) by mouth daily. 12/05/18  Yes [name], NP  bictegravir-emtricitabine-tenofovir AF (BIKTARVY) 50-200-25 MG TABS tablet Take 1 tablet by mouth daily. 03/05/18  Yes [name], MD  cadexomer iodine (IODOSORB) 0.9 % gel Apply 1 application topically daily as needed for wound care. 11/29/18  Yes Marzetta Board, DPM  carvedilol (COREG) 25 MG tablet TAKE 1 TABLET BY MOUTH TWICE A DAY WITH MEALS Patient taking differently: Take 25 mg by mouth 2 (two) times daily with a meal.  07/19/18  Yes [name], MD  Cholecalciferol (VITAMIN D3) 50 MCG (2000 UT) TABS Take 2,000 Units by mouth daily.   Yes  [provider]  Choline Fenofibrate (FENOFIBRIC ACID) 135 MG CPDR TAKE 1 CAPSULE BY MOUTH DAILY 01/21/19  Yes [name], NP  colchicine 0.6 MG tablet Take one tablet by mouth as needed for gout 02/23/18  Yes Gildardo Cranker, DO  furosemide (LASIX) 80 MG tablet Take 1.5 tablets (120 mg total) by mouth 2 (two) times daily. Patient taking differently: Take 160 mg by mouth 2 (two) times daily.  03/01/18  Yes Ghimire, Henreitta Leber, MD  gabapentin (NEURONTIN) 300 MG capsule TAKE 1 CAPSULE BY MOUTH THREE TIMES DAILY Patient taking differently: Take 300 mg by mouth 3 (three) times daily.  01/21/19  Yes [name], NP  Glucosamine-Chondroit-Vit C-Mn (GLUCOSAMINE CHONDR 1500 COMPLX PO) Take 2 tablets by mouth daily.    Yes [provider]  hydrALAZINE (APRESOLINE) 25 MG tablet Take 1 tablet (25 mg total) by mouth 3 (three) times daily. 01/21/19  Yes [name], NP  HYDROcodone-acetaminophen (NORCO) 5-325 MG tablet Take 1 tablet by mouth every 6 (six) hours as needed for moderate pain. 11/05/18  Yes Marty Heck, MD  insulin lispro (HUMALOG KWIKPEN) 100 UNIT/ML KiwkPen Inject 18 units subcutaneously three times daily before meals 05/31/18  Yes [name],  Monica, DO  isosorbide mononitrate (IMDUR) 30 MG 24 hr tablet TAKE 1 TABLET(30 MG) BY MOUTH DAILY Patient taking differently: Take 30 mg by mouth daily.  01/15/19  Yes [name], NP  LANTUS SOLOSTAR 100 UNIT/ML Solostar Pen Inject 74 units subcutaneously every evening Patient taking differently: Inject 74 Units into the skin daily. Inject 74 units subcutaneously every morning 12/04/18  Yes Eubanks, [name], NP  latanoprost (XALATAN) 0.005 % ophthalmic solution Place 1 drop into both eyes at bedtime. Reported on 04/04/2016 04/23/12  Yes [provider]  nitroGLYCERIN (NITROSTAT) 0.3 MG SL tablet Place 1 tablet (0.3 mg total) under the tongue as needed for chest pain (not to exceed 3 tablets in one day.). 01/08/18  Yes [name], MD  sodium bicarbonate 650 MG tablet Take 650 mg by mouth 2 (two) times daily.  05/21/18  Yes [provider]  umeclidinium-vilanterol (ANORO ELLIPTA) 62.5-25 MCG/INH AEPB INHALE 1 PUFF BY MOUTH EVERY DAY 01/15/19  Yes [name], NP  B-D UF III MINI PEN NEEDLES 31G X 5 MM MISC use four times a day 03/13/18   [name], NP  Blood Glucose Monitoring Suppl (ONE TOUCH ULTRA MINI) w/Device KIT 1 each by Does not apply route 2 (two) times daily. Dx: E11.40 08/15/17   Gildardo Cranker, DO  gentamicin cream (GARAMYCIN) 0.1 % Apply 1 application topically daily. Foot Patient not taking: Reported on 02/06/2019 11/19/18   Edrick Kins, DPM  glucose blood test strip Use to test blood sugar three times daily. E11.40 06/15/18   Gildardo Cranker, DO  Lancets [employer] DELICA PLUS [id number]) [place] 1 each by Does not apply route 3 (three) times daily. Use to test blood sugar three times daily. Dx: E11.40 12/05/18   [name], NP    Current Facility-Administered Medications  Medication Dose Route Frequency Provider Last Rate Last Dose  . 0.9 %  sodium chloride infusion (Manually program via Guardrails IV Fluids)   Intravenous Once Joy, Shawn C,  PA-C      . acetaminophen (TYLENOL) tablet 500 mg  500 mg Oral Q6H PRN Caren Griffins, MD      . Derrill Memo ON 02/07/2019] allopurinol (ZYLOPRIM) tablet 100 mg  100 mg Oral Daily Gherghe, Costin  M, MD      . atorvastatin (LIPITOR) tablet 80 mg  80 mg Oral q1800 Caren Griffins, MD      . Derrill Memo ON 02/07/2019] bictegravir-emtricitabine-tenofovir AF (BIKTARVY) 50-200-25 MG per tablet 1 tablet  1 tablet Oral Daily Caren Griffins, MD      . carvedilol (COREG) tablet 25 mg  25 mg Oral BID WC Gherghe, Costin M, MD      . furosemide (LASIX) tablet 120 mg  120 mg Oral BID Caren Griffins, MD      . gabapentin (NEURONTIN) capsule 300 mg  300 mg Oral TID Caren Griffins, MD      . hydrALAZINE (APRESOLINE) tablet 25 mg  25 mg Oral TID Caren Griffins, MD      . HYDROcodone-acetaminophen (NORCO/VICODIN) 5-325 MG per tablet 1 tablet  1 tablet Oral Q6H PRN Caren Griffins, MD      . insulin aspart (novoLOG) injection 0-5 Units  0-5 Units Subcutaneous QHS Gherghe, Costin M, MD      . insulin aspart (novoLOG) injection 0-9 Units  0-9 Units Subcutaneous TID WC Caren Griffins, MD      . Derrill Memo ON 02/07/2019] insulin glargine (LANTUS) injection 50 Units  50 Units Subcutaneous Daily Caren Griffins, MD      . Derrill Memo ON 02/07/2019] isosorbide mononitrate (IMDUR) 24 hr tablet 30 mg  30 mg Oral Daily Gherghe, Costin M, MD      . ondansetron (ZOFRAN) tablet 4 mg  4 mg Oral Q6H PRN Caren Griffins, MD       Or  . ondansetron (ZOFRAN) injection 4 mg  4 mg Intravenous Q6H PRN Caren Griffins, MD      . pantoprazole (PROTONIX) injection 40 mg  40 mg Intravenous Q12H Caren Griffins, MD      . Derrill Memo ON 02/07/2019] umeclidinium-vilanterol (ANORO ELLIPTA) 62.5-25 MCG/INH 1 puff  1 puff Inhalation Daily Caren Griffins, MD        Allergies as of 02/06/2019 - Review Complete 02/06/2019  Allergen Reaction Noted  . Augmentin [amoxicillin-pot clavulanate] Diarrhea 12/05/2018  . Amphetamines Other (See  Comments) 01/15/2012    Family History  Problem Relation Age of Onset  . Heart failure Father   . Hypertension Father   . Diabetes Brother   . Heart attack Brother   . Alzheimer's disease Mother   . Stroke Sister   . Diabetes Sister   . Alzheimer's disease Sister   . Hypertension Brother   . Diabetes Brother   . Drug abuse Brother   . Colon cancer Neg Hx     Social History   Socioeconomic History  . Marital status: Single    Spouse name: Not on file  . Number of children: 1  . Years of education: Not on file  . Highest education level: Not on file  Occupational History  . Occupation: retired  . Occupation: part time- [employer]  . Financial resource strain: Not hard at all  . Food insecurity:    Worry: Never true    Inability: Never true  . Transportation needs:    Medical: No    Non-medical: No  Tobacco Use  . Smoking status: Current Every Day Smoker    Packs/day: 1.00    Years: 43.00    Pack years: 43.00    Types: Cigarettes  . Smokeless tobacco: Never Used  Substance and Sexual Activity  . Alcohol use: Yes    Alcohol/week:  12.0 standard drinks    Types: 12 Standard drinks or equivalent per week    Comment: 6x 24oz 8% ABV beers a week  . Drug use: No    Types: Cocaine    Comment: hx of crack/cocaine 65yr ago  . Sexual activity: Not Currently  Lifestyle  . Physical activity:    Days per week: 7 days    Minutes per session: 20 min  . Stress: Only a little  Relationships  . Social connections:    Talks on phone: Three times a week    Gets together: Three times a week    Attends religious service: More than 4 times per year    Active member of club or organization: No    Attends meetings of clubs or organizations: Never    Relationship status: Never married  . Intimate partner violence:    Fear of current or ex partner: No    Emotionally abused: No    Physically abused: No    Forced sexual activity: No  Other Topics Concern  . Not on  file  Social History Narrative   Retired.       As of 05/2015:   Diet: No salt    Caffeine   Married: Single    House: Condo, 2 stories, 1 person (self)    Pets: No    Current/Past profession: N/A   Exercise: walks daily    Living Will: Yes    DNR   POA/HPOA: No        Review of Systems: All systems reviewed and negative except where noted in HPI.  Physical Exam: Vital signs in last 24 hours: Temp:  [97.8 F (36.6 C)-98.6 F (37 C)] 98.6 F (37 C) (04/15 1523) Pulse Rate:  [77-82] 80 (04/15 1523) Resp:  [16-20] 18 (04/15 1523) BP: (135-159)/(68-85) 159/76 (04/15 1523) SpO2:  [97 %-100 %] 97 % (04/15 1523) Weight:  [89.8 kg-91.2 kg] 91.2 kg (04/15 1522)   General:   Alert, well-developed,  male in NAD Psych:  Pleasant, cooperative. Normal mood and affect. Eyes:  Pupils equal, sclera clear, no icterus.   Conjunctiva pink. Ears:  Normal auditory acuity. Nose:  No deformity, discharge,  or lesions. Neck:  Supple; no masses Lungs:  Clear throughout to auscultation.   No wheezes, crackles, or rhonchi.  Heart:  Regular rate and rhythm; no murmurs, no lower extremity edema Abdomen:  Soft, non-distended, nontender, BS active, no palp mass    Rectal:  Deferred  Msk:  Symmetrical without gross deformities. . Neurologic:  Alert and  oriented x4;  grossly normal neurologically. Skin:  Intact without significant lesions or rashes.   Intake/Output from previous day: No intake/output data recorded. Intake/Output this shift: No intake/output data recorded.  Lab Results: Recent Labs    02/06/19 1045  WBC 5.8  HGB 6.4*  HCT 20.3*  PLT 199   BMET Recent Labs    02/06/19 1045  NA 138  K 4.5  CL 108  CO2 17*  GLUCOSE 263*  BUN 99*  CREATININE 5.74*  CALCIUM 8.8*   LFT Recent Labs    02/06/19 1045  PROT 7.1  ALBUMIN 3.3*  AST 25  ALT 20  ALKPHOS 36*  BILITOT 0.5     Studies/Results: Dg Chest 2 View  Result Date: 02/06/2019 CLINICAL DATA:  Low  hemoglobin today.  Chest pain. EXAM: CHEST - 2 VIEW COMPARISON:  02/28/2018 FINDINGS: Sternotomy wires unchanged. Lungs are adequately inflated without focal airspace consolidation or effusion. Moderate  stable cardiomegaly. Remainder of the exam is unchanged. IMPRESSION: No acute findings. Electronically Signed   By: Marin Olp M.D.   On: 02/06/2019 11:32     [name] Savoy, NP-C @  02/06/2019, 4:12 PM

## 2019-02-06 NOTE — ED Notes (Signed)
IV Infusing without pain or s/sxof infiltration.

## 2019-02-06 NOTE — H&P (Signed)
History and Physical    Jacob [name] YHC:[id number] DOB: 09/04/1951 DOA: 02/06/2019  I have briefly reviewed the patient's prior medical records in [place]  PCP: [name], NP  Patient coming from: home   Chief Complaint: weakness, anemia, black stools  HPI: Jacob [name] is a 68 y.o. male with medical history significant of chronic kidney disease stage V not currently on dialysis (AV fistula placed), diabetes mellitus, coronary artery disease, HIV, hypertension, hyperlipidemia, presents to the hospital with chief complaint of being sent from his PCP due to a hemoglobin in the 6 range.  Patient tells me that he has been feeling a little bit weak over the last month, has noticed fatigue and tiredness every time he tries to walk just a few feet.  He also feels a little bit dyspneic with that.  He reports that he has been seeing intermittent black stools over the last 2 weeks, on and off he sees black stools 1 to 2 days at a time.  Last time was about 3 to 4 days ago.  He only takes aspirin and denies any other NSAIDs.  He denies any nausea or vomiting.  He denies any abdominal pain.  Black stools have also been associated with slightly loose stools.  He denies any fever or chills, denies any chest pain, denies any palpitations.  ED Course: In the emergency room his vital signs are stable, he is afebrile, normotensive, satting 100% on room air.  Blood work reveals a BUN of 9 9 and a creatinine of 5.7, bicarb 17.  Potassium is 4.5.  Initial troponin 0 0.48.  Hemoglobin was found to be 6.4.  Review of Systems: As per HPI otherwise 10 point review of systems negative.   Past Medical History:  Diagnosis Date  . Arthritis    "all over; mostly knees and back" (02/28/2018)  . CHF (congestive heart failure) (HCC)    not on any meds  . Chronic lower back pain    stenosis  . CKD (chronic kidney disease), stage V ([place])    [name] 02/28/2018  . Coronary atherosclerosis of native coronary  artery 2005   s/p surgery  . Drug abuse ([place])    hx; tested for cocaine as recently as 2/08. says he is not using drugs now - avoided defib. for this reason   . GERD (gastroesophageal reflux disease)    takes OTC meds as needed  . Glaucoma    uses eye drops daily  . Hepatitis B 1968   "tx'd w/isolation; caught it from toilet stools in gym"  . History of colon polyps    benign  . History of gout    takes Allopurinol daily as well as Colchicine-if needed (02/28/2018)  . History of kidney stones   . HTN (hypertension)    takes Coreg,Imdur.and Apresoline daily  . Human immunodeficiency virus (HIV) disease ([place]) dx'd 1995   takes Genvoya daily  . Hyperlipidemia    takes Atorvastatin daily  . Ischemic cardiomyopathy   . Muscle spasm    takes Zanaflex as needed  . Myocardial infarction ([place]) ~ 2004/2005  . Nocturia   . Peripheral neuropathy    takes gabapentin daily  . Pneumonia    "at least twice" (02/28/2018)  . Shortness of breath dyspnea    rarely but if notices it then with exertion  . Syphilis, unspecified   . Type II diabetes mellitus ([place]) 2004   Lantus daily.Average fasting blood sugar 125-199  . Wears  glasses   . Wears partial dentures     Past Surgical History:  Procedure Laterality Date  . AV FISTULA PLACEMENT Left 08/02/2018   Procedure: ARTERIOVENOUS (AV) FISTULA CREATION  left arm radiocephlic;  Surgeon: Marty Heck, MD;  Location: [facility];  Service: Vascular;  Laterality: Left;  . BACK SURGERY    . CARDIAC CATHETERIZATION  10/2002; 12/19/2004   [name] 03/08/2011  . COLONOSCOPY  2013   Dr.John Henrene Pastor   . CORONARY ARTERY BYPASS GRAFT  02/24/2003   CABG X2/notes 03/08/2011  . FRACTURE SURGERY    . INTERTROCHANTERIC HIP FRACTURE SURGERY Left 11/2006   [name] 03/08/2011  . LAPAROSCOPIC CHOLECYSTECTOMY  05/2006  . LIGATION OF COMPETING BRANCHES OF ARTERIOVENOUS FISTULA Left 11/05/2018   Procedure: LIGATION OF COMPETING BRANCHES OF ARTERIOVENOUS FISTULA  LEFT   ARM;  Surgeon: Marty Heck, MD;  Location: [facility];  Service: Vascular;  Laterality: Left;  . LUMBAR LAMINECTOMY/DECOMPRESSION MICRODISCECTOMY N/A 02/29/2016   Procedure: Left L4-5 Lateral Recess Decompression, Removal Extradural Intraspinal Facet Cyst;  Surgeon: Marybelle Killings, MD;  Location: [facility];  Service: Orthopedics;  Laterality: N/A;  . MULTIPLE TOOTH EXTRACTIONS    . ORIF MANDIBULAR FRACTURE Left 08/13/2004   ORIF of left body fracture mandible with KLS Martin 2.3-mm six hole/notes 03/08/2011     reports that he has been smoking cigarettes. He has a 43.00 pack-year smoking history. He has never used smokeless tobacco. He reports current alcohol use of about 12.0 standard drinks of alcohol per week. He reports that he does not use drugs.  Allergies  Allergen Reactions  . Augmentin [Amoxicillin-Pot Clavulanate] Diarrhea    Severe diarrhea   . Amphetamines Other (See Comments)    Unknown reaction type    Family History  Problem Relation Age of Onset  . Heart failure Father   . Hypertension Father   . Diabetes Brother   . Heart attack Brother   . Alzheimer's disease Mother   . Stroke Sister   . Diabetes Sister   . Alzheimer's disease Sister   . Hypertension Brother   . Diabetes Brother   . Drug abuse Brother   . Colon cancer Neg Hx     Prior to Admission medications   Medication Sig Start Date End Date Taking? Authorizing Provider  acetaminophen (TYLENOL) 500 MG tablet Take 500 mg by mouth every 6 (six) hours as needed for moderate pain.   Yes [provider]  allopurinol (ZYLOPRIM) 100 MG tablet Take 1 tablet (100 mg total) by mouth daily. 01/15/19  Yes [name], NP  aspirin EC 81 MG tablet Take 81 mg by mouth daily.   Yes [provider]  atorvastatin (LIPITOR) 80 MG tablet Take 1 tablet (80 mg total) by mouth daily. 12/05/18  Yes [name], NP  bictegravir-emtricitabine-tenofovir AF (BIKTARVY) 50-200-25 MG TABS tablet Take 1 tablet  by mouth daily. 03/05/18  Yes [name], MD  cadexomer iodine (IODOSORB) 0.9 % gel Apply 1 application topically daily as needed for wound care. 11/29/18  Yes Marzetta Board, DPM  carvedilol (COREG) 25 MG tablet TAKE 1 TABLET BY MOUTH TWICE A DAY WITH MEALS Patient taking differently: Take 25 mg by mouth 2 (two) times daily with a meal.  07/19/18  Yes [name], MD  Cholecalciferol (VITAMIN D3) 50 MCG (2000 UT) TABS Take 2,000 Units by mouth daily.   Yes [provider]  Choline Fenofibrate (FENOFIBRIC ACID) 135 MG CPDR TAKE 1 CAPSULE BY MOUTH  DAILY 01/21/19  Yes [name], NP  colchicine 0.6 MG tablet Take one tablet by mouth as needed for gout 02/23/18  Yes Gildardo Cranker, DO  furosemide (LASIX) 80 MG tablet Take 1.5 tablets (120 mg total) by mouth 2 (two) times daily. Patient taking differently: Take 160 mg by mouth 2 (two) times daily.  03/01/18  Yes Ghimire, Henreitta Leber, MD  gabapentin (NEURONTIN) 300 MG capsule TAKE 1 CAPSULE BY MOUTH THREE TIMES DAILY Patient taking differently: Take 300 mg by mouth 3 (three) times daily.  01/21/19  Yes [name], NP  Glucosamine-Chondroit-Vit C-Mn (GLUCOSAMINE CHONDR 1500 COMPLX PO) Take 2 tablets by mouth daily.    Yes [provider]  hydrALAZINE (APRESOLINE) 25 MG tablet Take 1 tablet (25 mg total) by mouth 3 (three) times daily. 01/21/19  Yes [name], NP  HYDROcodone-acetaminophen (NORCO) 5-325 MG tablet Take 1 tablet by mouth every 6 (six) hours as needed for moderate pain. 11/05/18  Yes Marty Heck, MD  insulin lispro (HUMALOG KWIKPEN) 100 UNIT/ML KiwkPen Inject 18 units subcutaneously three times daily before meals 05/31/18  Yes [name], Monica, DO  isosorbide mononitrate (IMDUR) 30 MG 24 hr tablet TAKE 1 TABLET(30 MG) BY MOUTH DAILY Patient taking differently: Take 30 mg by mouth daily.  01/15/19  Yes [name], NP  LANTUS SOLOSTAR 100 UNIT/ML Solostar Pen Inject 74 units subcutaneously  every evening Patient taking differently: Inject 74 Units into the skin daily. Inject 74 units subcutaneously every morning 12/04/18  Yes Eubanks, [name], NP  latanoprost (XALATAN) 0.005 % ophthalmic solution Place 1 drop into both eyes at bedtime. Reported on 04/04/2016 04/23/12  Yes [provider]  nitroGLYCERIN (NITROSTAT) 0.3 MG SL tablet Place 1 tablet (0.3 mg total) under the tongue as needed for chest pain (not to exceed 3 tablets in one day.). 01/08/18  Yes [name], MD  sodium bicarbonate 650 MG tablet Take 650 mg by mouth 2 (two) times daily.  05/21/18  Yes [provider]  umeclidinium-vilanterol (ANORO ELLIPTA) 62.5-25 MCG/INH AEPB INHALE 1 PUFF BY MOUTH EVERY DAY 01/15/19  Yes [name], NP  B-D UF III MINI PEN NEEDLES 31G X 5 MM MISC use four times a day 03/13/18   [name], NP  Blood Glucose Monitoring Suppl (ONE TOUCH ULTRA MINI) w/Device KIT 1 each by Does not apply route 2 (two) times daily. Dx: E11.40 08/15/17   Gildardo Cranker, DO  gentamicin cream (GARAMYCIN) 0.1 % Apply 1 application topically daily. Foot Patient not taking: Reported on 02/06/2019 11/19/18   Edrick Kins, DPM  glucose blood test strip Use to test blood sugar three times daily. E11.40 06/15/18   Gildardo Cranker, DO  Lancets [facility] DELICA PLUS [id number]) [place] 1 each by Does not apply route 3 (three) times daily. Use to test blood sugar three times daily. Dx: E11.40 12/05/18   [name], NP    Physical Exam: Vitals:   02/06/19 1215 02/06/19 1221 02/06/19 1316 02/06/19 1346  BP: (!) 150/80 (!) 150/80 (!) 147/76 (!) 149/75  Pulse: 78 82 77 78  Resp: '19 16 18 18  ' Temp:   98.1 F (36.7 C) 97.8 F (36.6 C)  TempSrc:   Oral Oral  SpO2: 100% 100% 100% 100%  Weight:      Height:        Constitutional: NAD Eyes: PERRL, lids and conjunctivae normal ENMT: Mucous membranes are moist.  Neck: normal, supple, no masses, no  thyromegaly Respiratory: clear to  auscultation bilaterally, no wheezing, no crackles. Cardiovascular: Regular rate and rhythm, no murmurs / rubs / gallops. No extremity edema.  Abdomen: no tenderness, no masses palpated. Bowel sounds positive.  Musculoskeletal: no clubbing / cyanosis. Neurologic: CN 2-12 grossly intact. Strength 5/5 in all 4.  Psychiatric: Normal judgment and insight. Alert and oriented x 3. Normal mood.   Labs on Admission: I have personally reviewed following labs and imaging studies  CBC: Recent Labs  Lab 02/06/19 1045  WBC 5.8  NEUTROABS 2.7  HGB 6.4*  HCT 20.3*  MCV 96.2  PLT 998   Basic Metabolic Panel: Recent Labs  Lab 02/06/19 1045  NA 138  K 4.5  CL 108  CO2 17*  GLUCOSE 263*  BUN 99*  CREATININE 5.74*  CALCIUM 8.8*   GFR: Estimated Creatinine Clearance: 13.9 mL/min (A) (by C-G formula based on SCr of 5.74 mg/dL (H)). Liver Function Tests: Recent Labs  Lab 02/06/19 1045  AST 25  ALT 20  ALKPHOS 36*  BILITOT 0.5  PROT 7.1  ALBUMIN 3.3*   Recent Labs  Lab 02/06/19 1045  LIPASE 110*   No results for input(s): AMMONIA in the last 168 hours. Coagulation Profile: No results for input(s): INR, PROTIME in the last 168 hours. Cardiac Enzymes: Recent Labs  Lab 02/06/19 1045  TROPONINI 0.48*   BNP (last 3 results) No results for input(s): PROBNP in the last 8760 hours. HbA1C: No results for input(s): HGBA1C in the last 72 hours. CBG: No results for input(s): GLUCAP in the last 168 hours. Lipid Profile: No results for input(s): CHOL, HDL, LDLCALC, TRIG, CHOLHDL, LDLDIRECT in the last 72 hours. Thyroid Function Tests: No results for input(s): TSH, T4TOTAL, FREET4, T3FREE, THYROIDAB in the last 72 hours. Anemia Panel: No results for input(s): VITAMINB12, FOLATE, FERRITIN, TIBC, IRON, RETICCTPCT in the last 72 hours. Urine analysis:    Component Value Date/Time   COLORURINE STRAW (A) 02/06/2019 1115   APPEARANCEUR CLEAR 02/06/2019 1115   LABSPEC 1.009 02/06/2019  1115   PHURINE 5.0 02/06/2019 1115   GLUCOSEU 50 (A) 02/06/2019 1115   GLUCOSEU NEG mg/dL 09/20/2010 2058   HGBUR NEGATIVE 02/06/2019 1115   HGBUR negative 05/31/2010 0948   BILIRUBINUR NEGATIVE 02/06/2019 1115   KETONESUR NEGATIVE 02/06/2019 1115   PROTEINUR NEGATIVE 02/06/2019 1115   UROBILINOGEN 0.2 06/24/2015 2336   NITRITE NEGATIVE 02/06/2019 1115   LEUKOCYTESUR NEGATIVE 02/06/2019 1115     Radiological Exams on Admission: Dg Chest 2 View  Result Date: 02/06/2019 CLINICAL DATA:  Low hemoglobin today.  Chest pain. EXAM: CHEST - 2 VIEW COMPARISON:  02/28/2018 FINDINGS: Sternotomy wires unchanged. Lungs are adequately inflated without focal airspace consolidation or effusion. Moderate stable cardiomegaly. Remainder of the exam is unchanged. IMPRESSION: No acute findings. Electronically Signed   By: Marin Olp M.D.   On: 02/06/2019 11:32    EKG: Independently reviewed. Sinus rhythm  Assessment/Plan Active Problems:   Symptomatic anemia   Principal Problem Symptomatic anemia -Patient seems to have had rather rapid decrease in his hemoglobin, 6.4 on admission.  Hemoglobin just few months ago in January 2028 was 11.9.  He reports intermittent melena over the last several weeks.  He is fecal occult negative however story seems to be quite consistent with potential GI source for his blood loss. -EDP to call GI, appreciate input  Active Problems Chronic kidney disease stage V -Most recent creatinine that I see is in July 2019 and was 5.14.  This is close  to his baseline currently, potentially slightly affected by his anemia.  Will transfuse unit of packed red blood cells, continue home medications, avoid nephrotoxins  Elevated troponin -0.48, patient without any cardiac symptoms, this is likely in the setting of demand plus chronic kidney disease.  Continue to cycle  Type 2 diabetes mellitus, insulin-dependent -Continue home Lantus at a lower dose, placed on sliding scale   Hypertension -Continue home medications  Hyperlipidemia -Continue atorvastatin  HIV -Continue home medications, CD4 count 1220, HIV RNA less than 20 in August 2019  Coronary artery disease -No chest pain, continue Imdur, Coreg, high-dose statin  Gout -Continue home medications   DVT prophylaxis: SCDs  Code Status: Full code  Family Communication: no family at bedside Disposition Plan: home when ready  Consults called: GI    Marzetta Board, MD, PhD [facility]  Contact via [email or web address]  [facility] Office Info P: [phone]  F: [phone]   02/06/2019, 2:22 PM

## 2019-02-06 NOTE — ED Notes (Signed)
ED TO INPATIENT HANDOFF REPORT  ED Nurse Name and Phone #: Celene Squibb RN  [phone]  S Name/Age/Gender Jacob [name] 68 y.o. male Room/Bed: H011C/H011C  Code Status   Code Status: Prior  Home/SNF/Other Home Patient oriented to: self Is this baseline? Yes   Triage Complete: Triage complete  Chief Complaint Blood Transfusion  Triage Note No notes on file   Allergies Allergies  Allergen Reactions  . Augmentin [Amoxicillin-Pot Clavulanate] Diarrhea    Severe diarrhea   . Amphetamines Other (See Comments)    Unknown reaction type    Level of Care/Admitting Diagnosis ED Disposition    ED Disposition Condition [facility] Area: [place] [[id number]]  Level of Care: Telemetry Medical [104]  I expect the patient will be discharged within 24 hours: No (not a candidate for 5C-Observation unit)  Diagnosis: Symptomatic anemia [0865784]  Admitting Physician: Caren Griffins [id number]  Attending Physician: Caren Griffins [[id number]  PT Class (Do Not Modify): Observation [104]  PT Acc Code (Do Not Modify): Observation [10022]       B Medical/Surgery History Past Medical History:  Diagnosis Date  . Arthritis    "all over; mostly knees and back" (02/28/2018)  . CHF (congestive heart failure) (HCC)    not on any meds  . Chronic lower back pain    stenosis  . CKD (chronic kidney disease), stage V ([place])    [name] 02/28/2018  . Coronary atherosclerosis of native coronary artery 2005   s/p surgery  . Drug abuse ([place])    hx; tested for cocaine as recently as 2/08. says he is not using drugs now - avoided defib. for this reason   . GERD (gastroesophageal reflux disease)    takes OTC meds as needed  . Glaucoma    uses eye drops daily  . Hepatitis B 1968   "tx'd w/isolation; caught it from toilet stools in gym"  . History of colon polyps    benign  . History of gout    takes Allopurinol daily as well as Colchicine-if needed (02/28/2018)  .  History of kidney stones   . HTN (hypertension)    takes Coreg,Imdur.and Apresoline daily  . Human immunodeficiency virus (HIV) disease ([place]) dx'd 1995   takes Genvoya daily  . Hyperlipidemia    takes Atorvastatin daily  . Ischemic cardiomyopathy   . Muscle spasm    takes Zanaflex as needed  . Myocardial infarction ([place]) ~ 2004/2005  . Nocturia   . Peripheral neuropathy    takes gabapentin daily  . Pneumonia    "at least twice" (02/28/2018)  . Shortness of breath dyspnea    rarely but if notices it then with exertion  . Syphilis, unspecified   . Type II diabetes mellitus ([place]) 2004   Lantus daily.Average fasting blood sugar 125-199  . Wears glasses   . Wears partial dentures    Past Surgical History:  Procedure Laterality Date  . AV FISTULA PLACEMENT Left 08/02/2018   Procedure: ARTERIOVENOUS (AV) FISTULA CREATION  left arm radiocephlic;  Surgeon: Marty Heck, MD;  Location: [facility];  Service: Vascular;  Laterality: Left;  . BACK SURGERY    . CARDIAC CATHETERIZATION  10/2002; 12/19/2004   [name] 03/08/2011  . COLONOSCOPY  2013   Dr.John Henrene Pastor   . CORONARY ARTERY BYPASS GRAFT  02/24/2003   CABG X2/notes 03/08/2011  . FRACTURE SURGERY    . INTERTROCHANTERIC HIP FRACTURE SURGERY Left 11/2006   [name] 03/08/2011  .  LAPAROSCOPIC CHOLECYSTECTOMY  05/2006  . LIGATION OF COMPETING BRANCHES OF ARTERIOVENOUS FISTULA Left 11/05/2018   Procedure: LIGATION OF COMPETING BRANCHES OF ARTERIOVENOUS FISTULA  LEFT  ARM;  Surgeon: Marty Heck, MD;  Location: [facility];  Service: Vascular;  Laterality: Left;  . LUMBAR LAMINECTOMY/DECOMPRESSION MICRODISCECTOMY N/A 02/29/2016   Procedure: Left L4-5 Lateral Recess Decompression, Removal Extradural Intraspinal Facet Cyst;  Surgeon: Marybelle Killings, MD;  Location: [facility];  Service: Orthopedics;  Laterality: N/A;  . MULTIPLE TOOTH EXTRACTIONS    . ORIF MANDIBULAR FRACTURE Left 08/13/2004   ORIF of left body fracture mandible with KLS Martin 2.3-mm  six hole/notes 03/08/2011     A IV Location/Drains/Wounds Patient Lines/Drains/Airways Status   Active Line/Drains/Airways    Name:   Placement date:   Placement time:   Site:   Days:   Peripheral IV 02/06/19 Right Forearm   02/06/19    1016    Forearm   less than 1   Fistula / Graft Left Forearm Arteriovenous fistula   08/02/18    1417    Forearm   188   Incision (Closed) 08/02/18 Arm Left   08/02/18    1346     188   Incision (Closed) 11/05/18 Arm Left   11/05/18    1108     93          Intake/Output Last 24 hours No intake or output data in the 24 hours ending 02/06/19 1441  Labs/Imaging Results for orders placed or performed during the hospital encounter of 02/06/19 (from the past 48 hour(s))  CBC with Differential     Status: Abnormal   Collection Time: 02/06/19 10:45 AM  Result Value Ref Range   WBC 5.8 4.0 - 10.5 K/uL   RBC 2.11 (L) 4.22 - 5.81 MIL/uL   Hemoglobin 6.4 (LL) 13.0 - 17.0 g/dL    Comment: REPEATED TO VERIFY THIS CRITICAL RESULT HAS VERIFIED AND BEEN CALLED TO M Kristal Perl,RN BY MESSAN HOUEGNIFIO ON 04 15 2020 AT 1152, AND HAS BEEN READ BACK.     HCT 20.3 (L) 39.0 - 52.0 %   MCV 96.2 80.0 - 100.0 fL   MCH 30.3 26.0 - 34.0 pg   MCHC 31.5 30.0 - 36.0 g/dL   RDW 14.0 11.5 - 15.5 %   Platelets 199 150 - 400 K/uL   nRBC 0.0 0.0 - 0.2 %   Neutrophils Relative % 48 %   Neutro Abs 2.7 1.7 - 7.7 K/uL   Lymphocytes Relative 38 %   Lymphs Abs 2.2 0.7 - 4.0 K/uL   Monocytes Relative 10 %   Monocytes Absolute 0.6 0.1 - 1.0 K/uL   Eosinophils Relative 3 %   Eosinophils Absolute 0.2 0.0 - 0.5 K/uL   Basophils Relative 1 %   Basophils Absolute 0.1 0.0 - 0.1 K/uL   Immature Granulocytes 0 %   Abs Immature Granulocytes 0.02 0.00 - 0.07 K/uL    Comment: Performed at [facility] [address] [place], [place] [address]  Comprehensive metabolic panel     Status: Abnormal   Collection Time: 02/06/19 10:45 AM  Result Value Ref Range   Sodium 138 135 - 145 mmol/L    Potassium 4.5 3.5 - 5.1 mmol/L   Chloride 108 98 - 111 mmol/L   CO2 17 (L) 22 - 32 mmol/L   Glucose, Bld 263 (H) 70 - 99 mg/dL   BUN 99 (H) 8 - 23 mg/dL   Creatinine, Ser 5.74 (H)  0.61 - 1.24 mg/dL   Calcium 8.8 (L) 8.9 - 10.3 mg/dL   Total Protein 7.1 6.5 - 8.1 g/dL   Albumin 3.3 (L) 3.5 - 5.0 g/dL   AST 25 15 - 41 U/L   ALT 20 0 - 44 U/L   Alkaline Phosphatase 36 (L) 38 - 126 U/L   Total Bilirubin 0.5 0.3 - 1.2 mg/dL   GFR calc non Af Amer 9 (L) >60 mL/min   GFR calc Af Amer 11 (L) >60 mL/min   Anion gap 13 5 - 15    Comment: Performed at [facility] [address], [place], [place] [address]  Lipase, blood     Status: Abnormal   Collection Time: 02/06/19 10:45 AM  Result Value Ref Range   Lipase 110 (H) 11 - 51 U/L    Comment: Performed at [facility] [address], [place], [place] [address]  Troponin I - Once     Status: Abnormal   Collection Time: 02/06/19 10:45 AM  Result Value Ref Range   Troponin I 0.48 (HH) <0.03 ng/mL    Comment: CRITICAL RESULT CALLED TO, READ BACK BY AND VERIFIED WITH: COFFEE,M RN @ 1224 02/06/19 LEONARD,A Performed at [facility], [place] [address], [place], [place] [address]   Type and screen [place]     Status: None (Preliminary result)   Collection Time: 02/06/19 10:45 AM  Result Value Ref Range   ABO/RH(D) A POS    Antibody Screen NEG    Sample Expiration 02/09/2019    Unit Number [id number]    Blood Component Type RED CELLS,LR    Unit division 00    Status of Unit ISSUED    Transfusion Status OK TO TRANSFUSE    Crossmatch Result      Compatible Performed at [facility], [place] [address] [place], [place] [address]   Urinalysis, Routine w reflex microscopic     Status: Abnormal   Collection Time: 02/06/19 11:15 AM  Result Value Ref Range   Color, Urine STRAW (A) YELLOW   APPearance CLEAR CLEAR   Specific Gravity, Urine 1.009 1.005 - 1.030   pH 5.0 5.0 - 8.0   Glucose, UA 50 (A)  NEGATIVE mg/dL   Hgb urine dipstick NEGATIVE NEGATIVE   Bilirubin Urine NEGATIVE NEGATIVE   Ketones, ur NEGATIVE NEGATIVE mg/dL   Protein, ur NEGATIVE NEGATIVE mg/dL   Nitrite NEGATIVE NEGATIVE   Leukocytes,Ua NEGATIVE NEGATIVE    Comment: Performed at [facility] [address] [place], [place] [address]  POC occult blood, ED Provider will collect     Status: None   Collection Time: 02/06/19 12:05 PM  Result Value Ref Range   Fecal Occult Bld NEGATIVE NEGATIVE  Prepare RBC     Status: None   Collection Time: 02/06/19 12:20 PM  Result Value Ref Range   Order Confirmation      ORDER PROCESSED BY BLOOD BANK Performed at [facility], [place] [address] [place], [place] [address]    Dg Chest 2 View  Result Date: 02/06/2019 CLINICAL DATA:  Low hemoglobin today.  Chest pain. EXAM: CHEST - 2 VIEW COMPARISON:  02/28/2018 FINDINGS: Sternotomy wires unchanged. Lungs are adequately inflated without focal airspace consolidation or effusion. Moderate stable cardiomegaly. Remainder of the exam is unchanged. IMPRESSION: No acute findings. Electronically Signed   By: Marin Olp M.D.   On: 02/06/2019 11:32    Pending Labs Unresulted  Labs (From admission, onward)    Start     Ordered   02/06/19 1255  Brain natriuretic peptide  Once,   STAT     02/06/19 1254   Signed and Held  CBC  Tomorrow morning,   R     Signed and Held   Signed and Held  Comprehensive metabolic panel  Tomorrow morning,   R     Signed and Held   Signed and Held  Troponin I - Now Then Q6H  Now then every 6 hours,   R     Signed and Held   Signed and Held  Basic metabolic panel  Tomorrow morning,   R    Question:  Specimen collection method  Answer:  Lab=Lab collect   Signed and Held   Signed and Held  CBC  Tomorrow morning,   R    Question:  Specimen collection method  Answer:  Lab=Lab collect   Signed and Held          Vitals/Pain Today's Vitals   02/06/19 1346 02/06/19 1400 02/06/19 1415 02/06/19  1430  BP: (!) 149/75 (!) 147/78 (!) 142/76 (!) 144/77  Pulse: 78 78 78 77  Resp: 18 17 18 18   Temp: 97.8 F (36.6 C)     TempSrc: Oral     SpO2: 100% 100% 100% 100%  Weight:      Height:      PainSc:        Isolation Precautions No active isolations  Medications Medications  0.9 %  sodium chloride infusion (Manually program via Guardrails IV Fluids) (has no administration in time range)  pantoprazole (PROTONIX) injection 40 mg (has no administration in time range)    Mobility walks Low fall risk   Focused Assessments Cardiac Assessment Handoff:  Cardiac Rhythm: Normal sinus rhythm Lab Results  Component Value Date   CKTOTAL 226 04/26/2015   CKMB 16.0 (HH) 07/02/2011   TROPONINI 0.48 (HH) 02/06/2019   No results found for: DDIMER Does the Patient currently have chest pain? No  e  R Recommendations: See Admitting Provider Note  Report given to:   Additional Notes:

## 2019-02-06 NOTE — Plan of Care (Signed)
  Problem: Education: Goal: Knowledge of General Education information will improve Description Including pain rating scale, medication(s)/side effects and non-pharmacologic comfort measures Outcome: Progressing   Problem: Education: Goal: Knowledge of General Education information will improve Description Including pain rating scale, medication(s)/side effects and non-pharmacologic comfort measures Outcome: Progressing   Problem: Clinical Measurements: Goal: Respiratory complications will improve Outcome: Progressing

## 2019-02-06 NOTE — Progress Notes (Signed)
Patient arrived to unit, patient has no complaints. Blood is transfusing.

## 2019-02-06 NOTE — H&P (View-Only) (Signed)
Referring Provider: [facility]   Primary Care Physician:  [name], NP Primary Gastroenterologist: Scarlette Shorts, MD     Reason for Consultation:    Anemia, black stool   ASSESSMENT / PLAN:    56. 68 year old male with multiple medical problems as outlined below, presenting with acute anemia, intermittent black stool over last two weeks.  For PT negative in the ED but hemoglobin down approximately 6 grams.  Rule out PUD, intestinal AVMs given history of CKD.  -Patient will need EGD, possibly tomorrow if there are no concerns for cardiac event given elevated troponin. -He is receiving packed red blood cells now -Trend H&H, continue to transfuse as needed  2. CAD / CABG. Elevated troponin, question demand ischemia.  Has been having some left-sided chest pain though that is clearly described as radiating from left lower back around abdomen and into chest so not sure this is cardiac related. Non-specific ST changes on EKG. Troponin being cycled.   3. HIV, on Biktarvy  4. Hx of illicit drug use (cocaine)  HPI:   Jacob [name] is a 68 y.o. male with multiple medical problems not limited to CKD 5, hypertension, DM 2, CAD/ CABG, CHF, HIV.  He had placement of a fistula a year ago for dialysis but has not yet started.  Patient presented to the ED augmentation of PCP for evaluation of anemia.  His hemoglobin had dropped from baseline of ~ 12 -13 to 6.4.  He admits to black stool on a few occasions last week and this week he also had 2 black bowel movements but says stools now back to normal.  He has recently developed shortness of breath, becomes winded even walking across the street.  He also describes left lower back pain anytime that he is walking.  Once he sits down the pain seems to radiate around to the abdomen up into his left chest.  He takes a fair amount of Tylenol but denies NSAID use other than daily aspirin.  He does not take an acid blocker on a regular basis just as  needed for heartburn.  No history of peptic ulcer disease.  He denies nausea or vomiting.  Weight stable at home.  ED evaluation: Hemodynamically stable.  White count 8.8, hemoglobin 6.4, MCV 96, platelets 199.  Glucose 263, BUN 99, creatinine 5.74, lipase 110, liver test unremarkable.  BNP 852, troponin 0.48 Chest x-ray without acute findings  EKG:  Sinus rhythm Probable left atrial enlargement Right bundle branch block Nonspecific T abnormalities, lateral leads  Endoscopic evaluation: Screening colonoscopy December 2013.  Findings included moderate diverticulosis.  A diminutive polyp was removed but not able to be retrieved.  Repeat colonoscopy recommended in 10 years  Past Medical History:  Diagnosis Date  . Arthritis    "all over; mostly knees and back" (02/28/2018)  . CHF (congestive heart failure) (HCC)    not on any meds  . Chronic lower back pain    stenosis  . CKD (chronic kidney disease), stage V ([place])    [name] 02/28/2018  . Coronary atherosclerosis of native coronary artery 2005   s/p surgery  . Drug abuse ([place])    hx; tested for cocaine as recently as 2/08. says he is not using drugs now - avoided defib. for this reason   . GERD (gastroesophageal reflux disease)    takes OTC meds as needed  . GI bleeding 02/06/2019  . Glaucoma    uses eye drops daily  . Hepatitis B  1968   "tx'd w/isolation; caught it from toilet stools in gym"  . History of colon polyps    benign  . History of gout    takes Allopurinol daily as well as Colchicine-if needed (02/28/2018)  . History of kidney stones   . HTN (hypertension)    takes Coreg,Imdur.and Apresoline daily  . Human immunodeficiency virus (HIV) disease ([place]) dx'd 1995   takes Genvoya daily  . Hyperlipidemia    takes Atorvastatin daily  . Ischemic cardiomyopathy   . Muscle spasm    takes Zanaflex as needed  . Myocardial infarction ([place]) ~ 2004/2005  . Nocturia   . Peripheral neuropathy    takes gabapentin daily  .  Pneumonia    "at least twice" (02/28/2018)  . Shortness of breath dyspnea    rarely but if notices it then with exertion  . Syphilis, unspecified   . Type II diabetes mellitus ([place]) 2004   Lantus daily.Average fasting blood sugar 125-199  . Wears glasses   . Wears partial dentures     Past Surgical History:  Procedure Laterality Date  . AV FISTULA PLACEMENT Left 08/02/2018   Procedure: ARTERIOVENOUS (AV) FISTULA CREATION  left arm radiocephlic;  Surgeon: Marty Heck, MD;  Location: [facility];  Service: Vascular;  Laterality: Left;  . BACK SURGERY    . CARDIAC CATHETERIZATION  10/2002; 12/19/2004   [name] 03/08/2011  . COLONOSCOPY  2013   Dr.John Henrene Pastor   . CORONARY ARTERY BYPASS GRAFT  02/24/2003   CABG X2/notes 03/08/2011  . FRACTURE SURGERY    . INTERTROCHANTERIC HIP FRACTURE SURGERY Left 11/2006   [name] 03/08/2011  . LAPAROSCOPIC CHOLECYSTECTOMY  05/2006  . LIGATION OF COMPETING BRANCHES OF ARTERIOVENOUS FISTULA Left 11/05/2018   Procedure: LIGATION OF COMPETING BRANCHES OF ARTERIOVENOUS FISTULA  LEFT  ARM;  Surgeon: Marty Heck, MD;  Location: [facility];  Service: Vascular;  Laterality: Left;  . LUMBAR LAMINECTOMY/DECOMPRESSION MICRODISCECTOMY N/A 02/29/2016   Procedure: Left L4-5 Lateral Recess Decompression, Removal Extradural Intraspinal Facet Cyst;  Surgeon: Marybelle Killings, MD;  Location: [facility];  Service: Orthopedics;  Laterality: N/A;  . MULTIPLE TOOTH EXTRACTIONS    . ORIF MANDIBULAR FRACTURE Left 08/13/2004   ORIF of left body fracture mandible with KLS Martin 2.3-mm six hole/notes 03/08/2011    Prior to Admission medications   Medication Sig Start Date End Date Taking? Authorizing Provider  acetaminophen (TYLENOL) 500 MG tablet Take 500 mg by mouth every 6 (six) hours as needed for moderate pain.   Yes [provider]  allopurinol (ZYLOPRIM) 100 MG tablet Take 1 tablet (100 mg total) by mouth daily. 01/15/19  Yes [name], NP  aspirin EC 81 MG tablet  Take 81 mg by mouth daily.   Yes [provider]  atorvastatin (LIPITOR) 80 MG tablet Take 1 tablet (80 mg total) by mouth daily. 12/05/18  Yes [name], NP  bictegravir-emtricitabine-tenofovir AF (BIKTARVY) 50-200-25 MG TABS tablet Take 1 tablet by mouth daily. 03/05/18  Yes [name], MD  cadexomer iodine (IODOSORB) 0.9 % gel Apply 1 application topically daily as needed for wound care. 11/29/18  Yes Marzetta Board, DPM  carvedilol (COREG) 25 MG tablet TAKE 1 TABLET BY MOUTH TWICE A DAY WITH MEALS Patient taking differently: Take 25 mg by mouth 2 (two) times daily with a meal.  07/19/18  Yes [name], MD  Cholecalciferol (VITAMIN D3) 50 MCG (2000 UT) TABS Take 2,000 Units by mouth daily.   Yes  [provider]  Choline Fenofibrate (FENOFIBRIC ACID) 135 MG CPDR TAKE 1 CAPSULE BY MOUTH DAILY 01/21/19  Yes [name], NP  colchicine 0.6 MG tablet Take one tablet by mouth as needed for gout 02/23/18  Yes Gildardo Cranker, DO  furosemide (LASIX) 80 MG tablet Take 1.5 tablets (120 mg total) by mouth 2 (two) times daily. Patient taking differently: Take 160 mg by mouth 2 (two) times daily.  03/01/18  Yes Ghimire, Henreitta Leber, MD  gabapentin (NEURONTIN) 300 MG capsule TAKE 1 CAPSULE BY MOUTH THREE TIMES DAILY Patient taking differently: Take 300 mg by mouth 3 (three) times daily.  01/21/19  Yes [name], NP  Glucosamine-Chondroit-Vit C-Mn (GLUCOSAMINE CHONDR 1500 COMPLX PO) Take 2 tablets by mouth daily.    Yes [provider]  hydrALAZINE (APRESOLINE) 25 MG tablet Take 1 tablet (25 mg total) by mouth 3 (three) times daily. 01/21/19  Yes [name], NP  HYDROcodone-acetaminophen (NORCO) 5-325 MG tablet Take 1 tablet by mouth every 6 (six) hours as needed for moderate pain. 11/05/18  Yes Marty Heck, MD  insulin lispro (HUMALOG KWIKPEN) 100 UNIT/ML KiwkPen Inject 18 units subcutaneously three times daily before meals 05/31/18  Yes [name],  Monica, DO  isosorbide mononitrate (IMDUR) 30 MG 24 hr tablet TAKE 1 TABLET(30 MG) BY MOUTH DAILY Patient taking differently: Take 30 mg by mouth daily.  01/15/19  Yes [name], NP  LANTUS SOLOSTAR 100 UNIT/ML Solostar Pen Inject 74 units subcutaneously every evening Patient taking differently: Inject 74 Units into the skin daily. Inject 74 units subcutaneously every morning 12/04/18  Yes Eubanks, [name], NP  latanoprost (XALATAN) 0.005 % ophthalmic solution Place 1 drop into both eyes at bedtime. Reported on 04/04/2016 04/23/12  Yes [provider]  nitroGLYCERIN (NITROSTAT) 0.3 MG SL tablet Place 1 tablet (0.3 mg total) under the tongue as needed for chest pain (not to exceed 3 tablets in one day.). 01/08/18  Yes [name], MD  sodium bicarbonate 650 MG tablet Take 650 mg by mouth 2 (two) times daily.  05/21/18  Yes [provider]  umeclidinium-vilanterol (ANORO ELLIPTA) 62.5-25 MCG/INH AEPB INHALE 1 PUFF BY MOUTH EVERY DAY 01/15/19  Yes [name], NP  B-D UF III MINI PEN NEEDLES 31G X 5 MM MISC use four times a day 03/13/18   [name], NP  Blood Glucose Monitoring Suppl (ONE TOUCH ULTRA MINI) w/Device KIT 1 each by Does not apply route 2 (two) times daily. Dx: E11.40 08/15/17   Gildardo Cranker, DO  gentamicin cream (GARAMYCIN) 0.1 % Apply 1 application topically daily. Foot Patient not taking: Reported on 02/06/2019 11/19/18   Edrick Kins, DPM  glucose blood test strip Use to test blood sugar three times daily. E11.40 06/15/18   Gildardo Cranker, DO  Lancets [facility] DELICA PLUS [id number]) [place] 1 each by Does not apply route 3 (three) times daily. Use to test blood sugar three times daily. Dx: E11.40 12/05/18   [name], NP    Current Facility-Administered Medications  Medication Dose Route Frequency Provider Last Rate Last Dose  . 0.9 %  sodium chloride infusion (Manually program via Guardrails IV Fluids)   Intravenous Once Joy, Shawn C,  PA-C      . acetaminophen (TYLENOL) tablet 500 mg  500 mg Oral Q6H PRN Caren Griffins, MD      . Derrill Memo ON 02/07/2019] allopurinol (ZYLOPRIM) tablet 100 mg  100 mg Oral Daily Gherghe, Costin  M, MD      . atorvastatin (LIPITOR) tablet 80 mg  80 mg Oral q1800 Caren Griffins, MD      . Derrill Memo ON 02/07/2019] bictegravir-emtricitabine-tenofovir AF (BIKTARVY) 50-200-25 MG per tablet 1 tablet  1 tablet Oral Daily Caren Griffins, MD      . carvedilol (COREG) tablet 25 mg  25 mg Oral BID WC Gherghe, Costin M, MD      . furosemide (LASIX) tablet 120 mg  120 mg Oral BID Caren Griffins, MD      . gabapentin (NEURONTIN) capsule 300 mg  300 mg Oral TID Caren Griffins, MD      . hydrALAZINE (APRESOLINE) tablet 25 mg  25 mg Oral TID Caren Griffins, MD      . HYDROcodone-acetaminophen (NORCO/VICODIN) 5-325 MG per tablet 1 tablet  1 tablet Oral Q6H PRN Caren Griffins, MD      . insulin aspart (novoLOG) injection 0-5 Units  0-5 Units Subcutaneous QHS Gherghe, Costin M, MD      . insulin aspart (novoLOG) injection 0-9 Units  0-9 Units Subcutaneous TID WC Caren Griffins, MD      . Derrill Memo ON 02/07/2019] insulin glargine (LANTUS) injection 50 Units  50 Units Subcutaneous Daily Caren Griffins, MD      . Derrill Memo ON 02/07/2019] isosorbide mononitrate (IMDUR) 24 hr tablet 30 mg  30 mg Oral Daily Gherghe, Costin M, MD      . ondansetron (ZOFRAN) tablet 4 mg  4 mg Oral Q6H PRN Caren Griffins, MD       Or  . ondansetron (ZOFRAN) injection 4 mg  4 mg Intravenous Q6H PRN Caren Griffins, MD      . pantoprazole (PROTONIX) injection 40 mg  40 mg Intravenous Q12H Caren Griffins, MD      . Derrill Memo ON 02/07/2019] umeclidinium-vilanterol (ANORO ELLIPTA) 62.5-25 MCG/INH 1 puff  1 puff Inhalation Daily Caren Griffins, MD        Allergies as of 02/06/2019 - Review Complete 02/06/2019  Allergen Reaction Noted  . Augmentin [amoxicillin-pot clavulanate] Diarrhea 12/05/2018  . Amphetamines Other (See  Comments) 01/15/2012    Family History  Problem Relation Age of Onset  . Heart failure Father   . Hypertension Father   . Diabetes Brother   . Heart attack Brother   . Alzheimer's disease Mother   . Stroke Sister   . Diabetes Sister   . Alzheimer's disease Sister   . Hypertension Brother   . Diabetes Brother   . Drug abuse Brother   . Colon cancer Neg Hx     Social History   Socioeconomic History  . Marital status: Single    Spouse name: Not on file  . Number of children: 1  . Years of education: Not on file  . Highest education level: Not on file  Occupational History  . Occupation: retired  . Occupation: part time- [employer]  . Financial resource strain: Not hard at all  . Food insecurity:    Worry: Never true    Inability: Never true  . Transportation needs:    Medical: No    Non-medical: No  Tobacco Use  . Smoking status: Current Every Day Smoker    Packs/day: 1.00    Years: 43.00    Pack years: 43.00    Types: Cigarettes  . Smokeless tobacco: Never Used  Substance and Sexual Activity  . Alcohol use: Yes    Alcohol/week:  12.0 standard drinks    Types: 12 Standard drinks or equivalent per week    Comment: 6x 24oz 8% ABV beers a week  . Drug use: No    Types: Cocaine    Comment: hx of crack/cocaine 15yr ago  . Sexual activity: Not Currently  Lifestyle  . Physical activity:    Days per week: 7 days    Minutes per session: 20 min  . Stress: Only a little  Relationships  . Social connections:    Talks on phone: Three times a week    Gets together: Three times a week    Attends religious service: More than 4 times per year    Active member of club or organization: No    Attends meetings of clubs or organizations: Never    Relationship status: Never married  . Intimate partner violence:    Fear of current or ex partner: No    Emotionally abused: No    Physically abused: No    Forced sexual activity: No  Other Topics Concern  . Not on  file  Social History Narrative   Retired.       As of 05/2015:   Diet: No salt    Caffeine   Married: Single    House: Condo, 2 stories, 1 person (self)    Pets: No    Current/Past profession: N/A   Exercise: walks daily    Living Will: Yes    DNR   POA/HPOA: No        Review of Systems: All systems reviewed and negative except where noted in HPI.  Physical Exam: Vital signs in last 24 hours: Temp:  [97.8 F (36.6 C)-98.6 F (37 C)] 98.6 F (37 C) (04/15 1523) Pulse Rate:  [77-82] 80 (04/15 1523) Resp:  [16-20] 18 (04/15 1523) BP: (135-159)/(68-85) 159/76 (04/15 1523) SpO2:  [97 %-100 %] 97 % (04/15 1523) Weight:  [89.8 kg-91.2 kg] 91.2 kg (04/15 1522)   General:   Alert, well-developed,  male in NAD Psych:  Pleasant, cooperative. Normal mood and affect. Eyes:  Pupils equal, sclera clear, no icterus.   Conjunctiva pink. Ears:  Normal auditory acuity. Nose:  No deformity, discharge,  or lesions. Neck:  Supple; no masses Lungs:  Clear throughout to auscultation.   No wheezes, crackles, or rhonchi.  Heart:  Regular rate and rhythm; no murmurs, no lower extremity edema Abdomen:  Soft, non-distended, nontender, BS active, no palp mass    Rectal:  Deferred  Msk:  Symmetrical without gross deformities. . Neurologic:  Alert and  oriented x4;  grossly normal neurologically. Skin:  Intact without significant lesions or rashes.   Intake/Output from previous day: No intake/output data recorded. Intake/Output this shift: No intake/output data recorded.  Lab Results: Recent Labs    02/06/19 1045  WBC 5.8  HGB 6.4*  HCT 20.3*  PLT 199   BMET Recent Labs    02/06/19 1045  NA 138  K 4.5  CL 108  CO2 17*  GLUCOSE 263*  BUN 99*  CREATININE 5.74*  CALCIUM 8.8*   LFT Recent Labs    02/06/19 1045  PROT 7.1  ALBUMIN 3.3*  AST 25  ALT 20  ALKPHOS 36*  BILITOT 0.5     Studies/Results: Dg Chest 2 View  Result Date: 02/06/2019 CLINICAL DATA:  Low  hemoglobin today.  Chest pain. EXAM: CHEST - 2 VIEW COMPARISON:  02/28/2018 FINDINGS: Sternotomy wires unchanged. Lungs are adequately inflated without focal airspace consolidation or effusion. Moderate  stable cardiomegaly. Remainder of the exam is unchanged. IMPRESSION: No acute findings. Electronically Signed   By: Marin Olp M.D.   On: 02/06/2019 11:32     [name] Savoy, NP-C @  02/06/2019, 4:12 PM

## 2019-02-06 NOTE — ED Provider Notes (Signed)
Medical screening examination/treatment/procedure(s) were conducted as a shared visit with non-physician practitioner(s) and myself.  I personally evaluated the patient during the encounter.  EKG Interpretation  Date/Time:  Wednesday February 06 2019 09:50:23 EDT Ventricular Rate:  85 PR Interval:    QRS Duration: 135 QT Interval:  451 QTC Calculation: 537 R Axis:   28 Text Interpretation:  Sinus rhythm Probable left atrial enlargement Right bundle branch block Nonspecific T abnormalities, lateral leads T wave changes are new Confirmed by Fredia Sorrow [id number]) on 02/06/2019 10:07:54 AM   Patient seen by me along with physician assistant.  Patient sent in by primary care doctor for low hemoglobin.  Apparently earlier this week it was 7.  Patient is prepped for dialysis but has not started dialysis.  Patient talks about having black stools.  Hemoglobin here is below 7 at 6.4.  White counts normal platelet count is normal.  The's stool Hemoccult point-of-care was negative for blood however.  Patient is also had some left anterior low chest pain on and off for several days.  Troponin is elevated here today at 0.4.  This could be secondary to the significant drop in hemoglobin because patient's hemoglobin was 11 just earlier this year.  Or could be related to a sort of silent MI picture.  Patient also with a complaint of abdominal distention.  And does have a great toe wound is being followed by podiatry.  Patient will require admission.  Type and screen transfusion ordered at least 1 unit to start with.  Also the elevated troponin discussed with cardiology.  They will follow.  Internal medicine admission.   Fredia Sorrow, MD 02/06/19 1326

## 2019-02-06 NOTE — ED Notes (Signed)
Pt using urinal now. Will send urine when pt finishes.

## 2019-02-06 NOTE — ED Provider Notes (Addendum)
[facility] EMERGENCY DEPARTMENT Provider Note   CSN: [id number] Arrival date & time: 02/06/19  0814    History   Chief Complaint No chief complaint on file.   HPI Jacob [name] is a 68 y.o. male.     HPI  Jacob [name] is a 68 y.o. male, with a history of CHF, CKD stage V, arthritis, MI, CABG, HIV, HTN, hyperlipidemia, DM, presenting to the ED sent by his PCP due to low hemoglobin.  Patient had CBC drawn 4/13 and received phone call today from his doctor telling him his hemoglobin was 7, previously >11. He has noted epigastric pain intermittently for the last month.  This pain lasts for a few minutes, arises with or without eating, radiates to the left upper quadrant, moderate to severe. He does note some shortness of breath with exertion over the last couple weeks.  He has also had intermittent dark stools with 1 or 2 stools a day.  No history of peptic ulcers or GI bleed.  Patient has renal failure, has had a fistula placed in the left arm, but states they have not yet started using it.  He is not receiving dialysis through any other method.  Last HIV quant and CD4 count noted on 03/18/18: <20 and 1220, respectively.   Denies fever/chills, cough, current shortness of breath, N/V/C/D, syncope, lower extremity edema/pain, or any other complaints.    Past Medical History:  Diagnosis Date  . Arthritis    "all over; mostly knees and back" (02/28/2018)  . CHF (congestive heart failure) (HCC)    not on any meds  . Chronic lower back pain    stenosis  . CKD (chronic kidney disease), stage V ([place])    [name] 02/28/2018  . Coronary atherosclerosis of native coronary artery 2005   s/p surgery  . Drug abuse ([place])    hx; tested for cocaine as recently as 2/08. says he is not using drugs now - avoided defib. for this reason   . GERD (gastroesophageal reflux disease)    takes OTC meds as needed  . Glaucoma    uses eye drops daily  . Hepatitis B 1968   "tx'd  w/isolation; caught it from toilet stools in gym"  . History of colon polyps    benign  . History of gout    takes Allopurinol daily as well as Colchicine-if needed (02/28/2018)  . History of kidney stones   . HTN (hypertension)    takes Coreg,Imdur.and Apresoline daily  . Human immunodeficiency virus (HIV) disease ([place]) dx'd 1995   takes Genvoya daily  . Hyperlipidemia    takes Atorvastatin daily  . Ischemic cardiomyopathy   . Muscle spasm    takes Zanaflex as needed  . Myocardial infarction ([place]) ~ 2004/2005  . Nocturia   . Peripheral neuropathy    takes gabapentin daily  . Pneumonia    "at least twice" (02/28/2018)  . Shortness of breath dyspnea    rarely but if notices it then with exertion  . Syphilis, unspecified   . Type II diabetes mellitus ([employer]) 2004   Lantus daily.Average fasting blood sugar 125-199  . Wears glasses   . Wears partial dentures     Patient Active Problem List   Diagnosis Date Noted  . Symptomatic anemia 02/06/2019  . CKD stage 4 due to type 2 diabetes mellitus ([place]) 05/30/2018  . Hyperlipidemia associated with type 2 diabetes mellitus ([place]) 05/30/2018  . Acute respiratory failure ([place]) 03/01/2018  .  Respiratory distress 02/28/2018  . Acute respiratory distress 02/28/2018  . Right foot ulcer ([place]) 02/28/2018  . Chronic obstructive pulmonary disease ([place]) 02/28/2018  . Anemia of chronic disease 11/20/2016  . CKD (chronic kidney disease) stage 3, GFR 30-59 ml/min (HCC) 11/20/2016  . CHF (congestive heart failure) ([place]) 11/10/2016  . Severe sepsis ([place]) 08/02/2016  . Pneumonia 08/02/2016  . History of lumbar laminectomy for spinal cord decompression 02/29/2016  . Acute renal failure superimposed on stage 3 chronic kidney disease ([place]) 11/03/2015  . Dehydration 11/03/2015  . Alcohol dependence ([place]) 11/03/2015  . Type 2 diabetes mellitus with vascular disease ([place]) 06/17/2015  . HTN (hypertension) 04/26/2015  . Syncope 04/26/2015  . DM (diabetes  mellitus) ([place]) 04/26/2015  . Ischemic cardiomyopathy 05/12/2014  . Diarrhea 09/08/2013  . Shock ([place]) 09/06/2013  . Ulcer of lower extremity ([place]) 09/06/2013  . Community acquired pneumonia 09/06/2013  . Hyponatremia 09/06/2013  . Hyperkalemia 09/06/2013  . Hyperglycemia 09/06/2013  . Elevated transaminase level 09/06/2013  . Chest pain 09/06/2013  . Type II or unspecified type diabetes mellitus with unspecified complication, not stated as uncontrolled 10/22/2012  . Black stool 04/17/2012  . Bunion of left foot 11/25/2011  . Bunion, right foot 11/25/2011  . Polysubstance abuse ([place]) 07/28/2011  . Hip fracture, left ([place]) 04/04/2011  . Closed fracture of neck of femur ([place]) 04/04/2011  . Insomnia 02/18/2011  . CAD (coronary artery disease) 04/20/2009  . Acute on chronic combined systolic (congestive) and diastolic (congestive) heart failure ([place]) 04/20/2009  . HLD (hyperlipidemia) 11/20/2006  . Gout 11/20/2006  . TOBACCO ABUSE 11/20/2006  . Essential hypertension 11/20/2006  . Human immunodeficiency virus (HIV) disease ([place]) 09/02/2006  . SYPHILIS 09/02/2006    Past Surgical History:  Procedure Laterality Date  . AV FISTULA PLACEMENT Left 08/02/2018   Procedure: ARTERIOVENOUS (AV) FISTULA CREATION  left arm radiocephlic;  Surgeon: Marty Heck, MD;  Location: [facility];  Service: Vascular;  Laterality: Left;  . BACK SURGERY    . CARDIAC CATHETERIZATION  10/2002; 12/19/2004   [name] 03/08/2011  . COLONOSCOPY  2013   Dr.John Henrene Pastor   . CORONARY ARTERY BYPASS GRAFT  02/24/2003   CABG X2/notes 03/08/2011  . FRACTURE SURGERY    . INTERTROCHANTERIC HIP FRACTURE SURGERY Left 11/2006   [name] 03/08/2011  . LAPAROSCOPIC CHOLECYSTECTOMY  05/2006  . LIGATION OF COMPETING BRANCHES OF ARTERIOVENOUS FISTULA Left 11/05/2018   Procedure: LIGATION OF COMPETING BRANCHES OF ARTERIOVENOUS FISTULA  LEFT  ARM;  Surgeon: Marty Heck, MD;  Location: [facility];  Service: Vascular;  Laterality:  Left;  . LUMBAR LAMINECTOMY/DECOMPRESSION MICRODISCECTOMY N/A 02/29/2016   Procedure: Left L4-5 Lateral Recess Decompression, Removal Extradural Intraspinal Facet Cyst;  Surgeon: Marybelle Killings, MD;  Location: [facility];  Service: Orthopedics;  Laterality: N/A;  . MULTIPLE TOOTH EXTRACTIONS    . ORIF MANDIBULAR FRACTURE Left 08/13/2004   ORIF of left body fracture mandible with KLS Martin 2.3-mm six hole/notes 03/08/2011        Home Medications    Prior to Admission medications   Medication Sig Start Date End Date Taking? Authorizing Provider  acetaminophen (TYLENOL) 500 MG tablet Take 500 mg by mouth every 6 (six) hours as needed for moderate pain.   Yes [provider]  allopurinol (ZYLOPRIM) 100 MG tablet Take 1 tablet (100 mg total) by mouth daily. 01/15/19  Yes [name], NP  aspirin EC 81 MG tablet Take 81 mg by mouth daily.   Yes [provider]  atorvastatin (LIPITOR) 80 MG tablet Take 1 tablet (80 mg total) by mouth daily. 12/05/18  Yes [name], NP  bictegravir-emtricitabine-tenofovir AF (BIKTARVY) 50-200-25 MG TABS tablet Take 1 tablet by mouth daily. 03/05/18  Yes [name], MD  cadexomer iodine (IODOSORB) 0.9 % gel Apply 1 application topically daily as needed for wound care. 11/29/18  Yes Marzetta Board, DPM  carvedilol (COREG) 25 MG tablet TAKE 1 TABLET BY MOUTH TWICE A DAY WITH MEALS Patient taking differently: Take 25 mg by mouth 2 (two) times daily with a meal.  07/19/18  Yes [name], MD  Cholecalciferol (VITAMIN D3) 50 MCG (2000 UT) TABS Take 2,000 Units by mouth daily.   Yes [provider]  Choline Fenofibrate (FENOFIBRIC ACID) 135 MG CPDR TAKE 1 CAPSULE BY MOUTH DAILY 01/21/19  Yes [name], NP  colchicine 0.6 MG tablet Take one tablet by mouth as needed for gout 02/23/18  Yes Gildardo Cranker, DO  furosemide (LASIX) 80 MG tablet Take 1.5 tablets (120 mg total) by mouth 2 (two) times daily. Patient taking  differently: Take 160 mg by mouth 2 (two) times daily.  03/01/18  Yes Ghimire, Henreitta Leber, MD  gabapentin (NEURONTIN) 300 MG capsule TAKE 1 CAPSULE BY MOUTH THREE TIMES DAILY Patient taking differently: Take 300 mg by mouth 3 (three) times daily.  01/21/19  Yes [name], NP  Glucosamine-Chondroit-Vit C-Mn (GLUCOSAMINE CHONDR 1500 COMPLX PO) Take 2 tablets by mouth daily.    Yes [provider]  hydrALAZINE (APRESOLINE) 25 MG tablet Take 1 tablet (25 mg total) by mouth 3 (three) times daily. 01/21/19  Yes [name], NP  HYDROcodone-acetaminophen (NORCO) 5-325 MG tablet Take 1 tablet by mouth every 6 (six) hours as needed for moderate pain. 11/05/18  Yes Marty Heck, MD  insulin lispro (HUMALOG KWIKPEN) 100 UNIT/ML KiwkPen Inject 18 units subcutaneously three times daily before meals 05/31/18  Yes [name], Monica, DO  isosorbide mononitrate (IMDUR) 30 MG 24 hr tablet TAKE 1 TABLET(30 MG) BY MOUTH DAILY Patient taking differently: Take 30 mg by mouth daily.  01/15/19  Yes [name], NP  LANTUS SOLOSTAR 100 UNIT/ML Solostar Pen Inject 74 units subcutaneously every evening Patient taking differently: Inject 74 Units into the skin daily. Inject 74 units subcutaneously every morning 12/04/18  Yes Eubanks, [name], NP  latanoprost (XALATAN) 0.005 % ophthalmic solution Place 1 drop into both eyes at bedtime. Reported on 04/04/2016 04/23/12  Yes [provider]  nitroGLYCERIN (NITROSTAT) 0.3 MG SL tablet Place 1 tablet (0.3 mg total) under the tongue as needed for chest pain (not to exceed 3 tablets in one day.). 01/08/18  Yes [name], MD  sodium bicarbonate 650 MG tablet Take 650 mg by mouth 2 (two) times daily.  05/21/18  Yes [provider]  umeclidinium-vilanterol (ANORO ELLIPTA) 62.5-25 MCG/INH AEPB INHALE 1 PUFF BY MOUTH EVERY DAY 01/15/19  Yes [name], NP  B-D UF III MINI PEN NEEDLES 31G X 5 MM MISC use four times a day 03/13/18    [name], NP  Blood Glucose Monitoring Suppl (ONE TOUCH ULTRA MINI) w/Device KIT 1 each by Does not apply route 2 (two) times daily. Dx: E11.40 08/15/17   Gildardo Cranker, DO  gentamicin cream (GARAMYCIN) 0.1 % Apply 1 application topically daily. Foot Patient not taking: Reported on 02/06/2019 11/19/18   Edrick Kins, DPM  glucose blood test strip Use to test blood sugar three times daily.  E11.40 06/15/18   Gildardo Cranker, DO  Lancets [facility] DELICA PLUS [id number]) [place] 1 each by Does not apply route 3 (three) times daily. Use to test blood sugar three times daily. Dx: E11.40 12/05/18   [name], NP    Family History Family History  Problem Relation Age of Onset  . Heart failure Father   . Hypertension Father   . Diabetes Brother   . Heart attack Brother   . Alzheimer's disease Mother   . Stroke Sister   . Diabetes Sister   . Alzheimer's disease Sister   . Hypertension Brother   . Diabetes Brother   . Drug abuse Brother   . Colon cancer Neg Hx     Social History Social History   Tobacco Use  . Smoking status: Current Every Day Smoker    Packs/day: 1.00    Years: 43.00    Pack years: 43.00    Types: Cigarettes  . Smokeless tobacco: Never Used  Substance Use Topics  . Alcohol use: Yes    Alcohol/week: 12.0 standard drinks    Types: 12 Standard drinks or equivalent per week    Comment: 6x 24oz 8% ABV beers a week  . Drug use: No    Types: Cocaine    Comment: hx of crack/cocaine 51yr ago     Allergies   Augmentin [amoxicillin-pot clavulanate] and Amphetamines   Review of Systems Review of Systems  Constitutional: Negative for chills, diaphoresis and fever.  Respiratory: Positive for shortness of breath. Negative for cough.   Cardiovascular: Negative for chest pain and leg swelling.  Gastrointestinal: Positive for abdominal pain and blood in stool. Negative for diarrhea, nausea and vomiting.  Genitourinary: Negative for dysuria, flank pain and  hematuria.  Musculoskeletal: Negative for back pain.  Neurological: Negative for dizziness, syncope, weakness, light-headedness and headaches.  All other systems reviewed and are negative.    Physical Exam Updated Vital Signs BP 135/68 (BP Location: Right Arm)   Pulse 82   Temp 97.8 F (36.6 C) (Oral)   Resp 20   Ht '6\' 2"'  (1.88 m)   Wt 89.8 kg   SpO2 100%   BMI 25.42 kg/m   Physical Exam Vitals signs and nursing note reviewed.  Constitutional:      General: He is not in acute distress.    Appearance: He is well-developed. He is not diaphoretic.  HENT:     Head: Normocephalic and atraumatic.     Mouth/Throat:     Mouth: Mucous membranes are moist.     Pharynx: Oropharynx is clear.  Eyes:     Conjunctiva/sclera: Conjunctivae normal.  Neck:     Musculoskeletal: Neck supple.  Cardiovascular:     Rate and Rhythm: Normal rate and regular rhythm.     Pulses: Normal pulses.     Heart sounds: Normal heart sounds.     Comments: Tactile temperature in the extremities appropriate and equal bilaterally. Pulmonary:     Effort: Pulmonary effort is normal. No respiratory distress.     Breath sounds: Normal breath sounds.  Abdominal:     General: There is distension.     Palpations: Abdomen is soft.     Tenderness: There is no abdominal tenderness. There is no guarding.  Genitourinary:    Rectum: Guaiac result negative.     Comments: No external hemorrhoids, fissures, or lesions noted. No frank blood or melena. No stool burden. Some rectal tenderness. No foreign bodies noted.  RN, MSharyn Lull served as cProducer, television/film/videoduring  the rectal exam. Musculoskeletal:     Right lower leg: No edema.     Left lower leg: No edema.  Lymphadenopathy:     Cervical: No cervical adenopathy.  Skin:    General: Skin is warm and dry.  Neurological:     Mental Status: He is alert.  Psychiatric:        Mood and Affect: Mood and affect normal.        Speech: Speech normal.        Behavior: Behavior  normal.      ED Treatments / Results  Labs (all labs ordered are listed, but only abnormal results are displayed) Labs Reviewed  CBC WITH DIFFERENTIAL/PLATELET - Abnormal; Notable for the following components:      Result Value   RBC 2.11 (*)    Hemoglobin 6.4 (*)    HCT 20.3 (*)    All other components within normal limits  COMPREHENSIVE METABOLIC PANEL - Abnormal; Notable for the following components:   CO2 17 (*)    Glucose, Bld 263 (*)    BUN 99 (*)    Creatinine, Ser 5.74 (*)    Calcium 8.8 (*)    Albumin 3.3 (*)    Alkaline Phosphatase 36 (*)    GFR calc non Af Amer 9 (*)    GFR calc Af Amer 11 (*)    All other components within normal limits  LIPASE, BLOOD - Abnormal; Notable for the following components:   Lipase 110 (*)    All other components within normal limits  URINALYSIS, ROUTINE W REFLEX MICROSCOPIC - Abnormal; Notable for the following components:   Color, Urine STRAW (*)    Glucose, UA 50 (*)    All other components within normal limits  TROPONIN I - Abnormal; Notable for the following components:   Troponin I 0.48 (*)    All other components within normal limits  BRAIN NATRIURETIC PEPTIDE - Abnormal; Notable for the following components:   B Natriuretic Peptide 852.1 (*)    All other components within normal limits  POC OCCULT BLOOD, ED  TYPE AND SCREEN  PREPARE RBC (CROSSMATCH)   Hemoglobin  Date Value Ref Range Status  02/06/2019 6.4 (LL) 13.0 - 17.0 g/dL Final    Comment:    REPEATED TO VERIFY THIS CRITICAL RESULT HAS VERIFIED AND BEEN CALLED TO M COFFEY,RN BY MESSAN HOUEGNIFIO ON 04 15 2020 AT 1152, AND HAS BEEN READ BACK.    11/05/2018 11.9 (L) 13.0 - 17.0 g/dL Final  08/02/2018 13.3 13.0 - 17.0 g/dL Final  03/13/2018 12.8 (L) 13.2 - 17.1 g/dL Final  08/07/2015 14.2 12.6 - 17.7 g/dL Final    EKG EKG Interpretation  Date/Time:  Wednesday February 06 2019 09:50:23 EDT Ventricular Rate:  85 PR Interval:    QRS Duration: 135 QT Interval:   451 QTC Calculation: 537 R Axis:   28 Text Interpretation:  Sinus rhythm Probable left atrial enlargement Right bundle branch block Nonspecific T abnormalities, lateral leads T wave changes are new Confirmed by Fredia Sorrow [phone]) on 02/06/2019 10:07:54 AM   Radiology Dg Chest 2 View  Result Date: 02/06/2019 CLINICAL DATA:  Low hemoglobin today.  Chest pain. EXAM: CHEST - 2 VIEW COMPARISON:  02/28/2018 FINDINGS: Sternotomy wires unchanged. Lungs are adequately inflated without focal airspace consolidation or effusion. Moderate stable cardiomegaly. Remainder of the exam is unchanged. IMPRESSION: No acute findings. Electronically Signed   By: Marin Olp M.D.   On: 02/06/2019 11:32    Procedures .  Critical Care Performed by: Lorayne Bender, PA-C Authorized by: Lorayne Bender, PA-C   Critical care provider statement:    Critical care time (minutes):  35   Critical care time was exclusive of:  Separately billable procedures and treating other patients   Critical care was necessary to treat or prevent imminent or life-threatening deterioration of the following conditions: Symptomatic anemia requiring transfusion.   Critical care was time spent personally by me on the following activities:  Development of treatment plan with patient or surrogate, discussions with consultants, evaluation of patient's response to treatment, examination of patient, obtaining history from patient or surrogate, ordering and performing treatments and interventions, ordering and review of laboratory studies, ordering and review of radiographic studies, pulse oximetry, re-evaluation of patient's condition and review of old charts   I assumed direction of critical care for this patient from another provider in my specialty: no     (including critical care time)  Medications Ordered in ED Medications  0.9 %  sodium chloride infusion (Manually program via Guardrails IV Fluids) (has no administration in time range)   pantoprazole (PROTONIX) injection 40 mg (has no administration in time range)     Initial Impression / Assessment and Plan / ED Course  I have reviewed the triage vital signs and the nursing notes.  Pertinent labs & imaging results that were available during my care of the patient were reviewed by me and considered in my medical decision making (see chart for details).  Clinical Course as of Feb 06 1512  Wed Feb 06, 2019  1301 Spoke with Wannetta Sender, Cards Master. States they can follow the patient as an outpatient, as needed, after his anemia is addressed.   [SJ]  1 Spoke with Dr. Cruzita Lederer, hospitalist. Agrees to admit the patient. Requests GI consult.    [SJ]  Sabin with [name] Savoy, NP with [facility]. States they will see the patient in consult.   [SJ]    Clinical Course User Index [SJ] Eugenie Harewood C, PA-C        Patient arrives at the request of his PCP due to low hemoglobin.  He endorses shortness of breath with ambulation over the last couple weeks that resolves with rest.  Found to have hemoglobin of 6.4 here in the ED.  No hypotension or tachycardia.  Maintains excellent SPO2 on room air.  Transfused RBCs. Positive troponin at 0.48, however, suspect this may be due to demand from his anemia. He endorses intermittent epigastric pain along with dark stools.  Hemoccult negative.  Patient does have risk factors for upper GI bleed, including tobacco use, alcohol use, age.  He states he will intermittently take Zantac, as needed. Denies NSAID use, but frequently consumes 2500-5000 mg acetaminophen daily for back pain. Patient admitted for further management.  Findings and plan of care discussed with Fredia Sorrow, MD. Dr. Rogene Houston personally evaluated and examined this patient.  Vitals:   02/06/19 1215 02/06/19 1221 02/06/19 1316 02/06/19 1346  BP: (!) 150/80 (!) 150/80 (!) 147/76 (!) 149/75  Pulse: 78 82 77 78  Resp: '19 16 18 18  ' Temp:   98.1 F (36.7 C) 97.8 F (36.6  C)  TempSrc:   Oral Oral  SpO2: 100% 100% 100% 100%  Weight:      Height:       Vitals:   02/06/19 1346 02/06/19 1400 02/06/19 1415 02/06/19 1430  BP: (!) 149/75 (!) 147/78 (!) 142/76 (!) 144/77  Pulse: 78 78 78 77  Resp: '18 17 18 18  ' Temp: 97.8 F (36.6 C)     TempSrc: Oral     SpO2: 100% 100% 100% 100%  Weight:      Height:         Final Clinical Impressions(s) / ED Diagnoses   Final diagnoses:  Low hemoglobin    ED Discharge Orders    None       Layla Maw 02/06/19 1508    Lorayne Bender, PA-C 02/06/19 1513    Fredia Sorrow, MD 02/09/19 1357

## 2019-02-07 ENCOUNTER — Encounter (HOSPITAL_COMMUNITY): Payer: Self-pay | Admitting: *Deleted

## 2019-02-07 ENCOUNTER — Observation Stay (HOSPITAL_COMMUNITY): Payer: Medicare Other | Admitting: Certified Registered Nurse Anesthetist

## 2019-02-07 ENCOUNTER — Inpatient Hospital Stay (HOSPITAL_COMMUNITY): Payer: Medicare Other

## 2019-02-07 ENCOUNTER — Encounter (HOSPITAL_COMMUNITY)
Admission: EM | Disposition: A | Discharge status: Home / Self Care | Payer: Self-pay | Source: Home / Self Care | Attending: Internal Medicine

## 2019-02-07 DIAGNOSIS — R931 Abnormal findings on diagnostic imaging of heart and coronary circulation: Secondary | ICD-10-CM

## 2019-02-07 DIAGNOSIS — I248 Other forms of acute ischemic heart disease: Secondary | ICD-10-CM | POA: Diagnosis present

## 2019-02-07 DIAGNOSIS — E785 Hyperlipidemia, unspecified: Secondary | ICD-10-CM | POA: Diagnosis present

## 2019-02-07 DIAGNOSIS — E11621 Type 2 diabetes mellitus with foot ulcer: Secondary | ICD-10-CM | POA: Diagnosis present

## 2019-02-07 DIAGNOSIS — N185 Chronic kidney disease, stage 5: Secondary | ICD-10-CM | POA: Diagnosis present

## 2019-02-07 DIAGNOSIS — K921 Melena: Secondary | ICD-10-CM | POA: Diagnosis present

## 2019-02-07 DIAGNOSIS — I10 Essential (primary) hypertension: Secondary | ICD-10-CM | POA: Diagnosis not present

## 2019-02-07 DIAGNOSIS — L97519 Non-pressure chronic ulcer of other part of right foot with unspecified severity: Secondary | ICD-10-CM | POA: Diagnosis present

## 2019-02-07 DIAGNOSIS — I255 Ischemic cardiomyopathy: Secondary | ICD-10-CM | POA: Diagnosis present

## 2019-02-07 DIAGNOSIS — D649 Anemia, unspecified: Secondary | ICD-10-CM | POA: Diagnosis present

## 2019-02-07 DIAGNOSIS — K31819 Angiodysplasia of stomach and duodenum without bleeding: Secondary | ICD-10-CM | POA: Diagnosis present

## 2019-02-07 DIAGNOSIS — I5022 Chronic systolic (congestive) heart failure: Secondary | ICD-10-CM | POA: Diagnosis not present

## 2019-02-07 DIAGNOSIS — R7989 Other specified abnormal findings of blood chemistry: Secondary | ICD-10-CM | POA: Diagnosis not present

## 2019-02-07 DIAGNOSIS — E1122 Type 2 diabetes mellitus with diabetic chronic kidney disease: Secondary | ICD-10-CM | POA: Diagnosis present

## 2019-02-07 DIAGNOSIS — D62 Acute posthemorrhagic anemia: Secondary | ICD-10-CM | POA: Diagnosis not present

## 2019-02-07 DIAGNOSIS — F17210 Nicotine dependence, cigarettes, uncomplicated: Secondary | ICD-10-CM | POA: Diagnosis present

## 2019-02-07 DIAGNOSIS — M545 Low back pain: Secondary | ICD-10-CM | POA: Diagnosis present

## 2019-02-07 DIAGNOSIS — D500 Iron deficiency anemia secondary to blood loss (chronic): Secondary | ICD-10-CM | POA: Diagnosis present

## 2019-02-07 DIAGNOSIS — I4510 Unspecified right bundle-branch block: Secondary | ICD-10-CM | POA: Diagnosis present

## 2019-02-07 DIAGNOSIS — M109 Gout, unspecified: Secondary | ICD-10-CM | POA: Diagnosis present

## 2019-02-07 DIAGNOSIS — I5042 Chronic combined systolic (congestive) and diastolic (congestive) heart failure: Secondary | ICD-10-CM | POA: Diagnosis present

## 2019-02-07 DIAGNOSIS — K2981 Duodenitis with bleeding: Secondary | ICD-10-CM | POA: Diagnosis present

## 2019-02-07 DIAGNOSIS — M1990 Unspecified osteoarthritis, unspecified site: Secondary | ICD-10-CM | POA: Diagnosis present

## 2019-02-07 DIAGNOSIS — G629 Polyneuropathy, unspecified: Secondary | ICD-10-CM | POA: Diagnosis present

## 2019-02-07 DIAGNOSIS — I2510 Atherosclerotic heart disease of native coronary artery without angina pectoris: Secondary | ICD-10-CM | POA: Diagnosis present

## 2019-02-07 DIAGNOSIS — G8929 Other chronic pain: Secondary | ICD-10-CM | POA: Diagnosis present

## 2019-02-07 DIAGNOSIS — K219 Gastro-esophageal reflux disease without esophagitis: Secondary | ICD-10-CM | POA: Diagnosis present

## 2019-02-07 DIAGNOSIS — B20 Human immunodeficiency virus [HIV] disease: Secondary | ICD-10-CM | POA: Diagnosis present

## 2019-02-07 DIAGNOSIS — I252 Old myocardial infarction: Secondary | ICD-10-CM | POA: Diagnosis not present

## 2019-02-07 DIAGNOSIS — I132 Hypertensive heart and chronic kidney disease with heart failure and with stage 5 chronic kidney disease, or end stage renal disease: Secondary | ICD-10-CM | POA: Diagnosis present

## 2019-02-07 LAB — CBC
HCT: 26 % — ABNORMAL LOW (ref 39.0–52.0)
Hemoglobin: 8.2 g/dL — ABNORMAL LOW (ref 13.0–17.0)
MCH: 29 pg (ref 26.0–34.0)
MCHC: 31.5 g/dL (ref 30.0–36.0)
MCV: 91.9 fL (ref 80.0–100.0)
Platelets: 201 10*3/uL (ref 150–400)
RBC: 2.83 MIL/uL — ABNORMAL LOW (ref 4.22–5.81)
RDW: 15.7 % — ABNORMAL HIGH (ref 11.5–15.5)
WBC: 6.8 10*3/uL (ref 4.0–10.5)
nRBC: 0 % (ref 0.0–0.2)

## 2019-02-07 LAB — TROPONIN I
Troponin I: 0.67 ng/mL (ref <0.03)
Troponin I: 0.69 ng/mL (ref <0.03)

## 2019-02-07 LAB — COMPREHENSIVE METABOLIC PANEL
ALT: 21 U/L (ref 0–44)
AST: 26 U/L (ref 15–41)
Albumin: 3.4 g/dL — ABNORMAL LOW (ref 3.5–5.0)
Alkaline Phosphatase: 40 U/L (ref 38–126)
Anion gap: 14 (ref 5–15)
BUN: 98 mg/dL — ABNORMAL HIGH (ref 8–23)
CO2: 19 mmol/L — ABNORMAL LOW (ref 22–32)
Calcium: 9.1 mg/dL (ref 8.9–10.3)
Chloride: 107 mmol/L (ref 98–111)
Creatinine, Ser: 5.63 mg/dL — ABNORMAL HIGH (ref 0.61–1.24)
GFR calc Af Amer: 11 mL/min — ABNORMAL LOW (ref >60)
GFR calc non Af Amer: 9 mL/min — ABNORMAL LOW (ref >60)
Glucose, Bld: 147 mg/dL — ABNORMAL HIGH (ref 70–99)
Potassium: 4.5 mmol/L (ref 3.5–5.1)
Sodium: 140 mmol/L (ref 135–145)
Total Bilirubin: 0.9 mg/dL (ref 0.3–1.2)
Total Protein: 7.6 g/dL (ref 6.5–8.1)

## 2019-02-07 LAB — TYPE AND SCREEN
ABO/RH(D): A POS
Antibody Screen: NEGATIVE
Unit division: 0

## 2019-02-07 LAB — POTASSIUM: Potassium: 4.4 mmol/L (ref 3.5–5.1)

## 2019-02-07 LAB — BPAM RBC
Blood Product Expiration Date: 202004162359
ISSUE DATE / TIME: 202004151303
Unit Type and Rh: 600

## 2019-02-07 LAB — GLUCOSE, CAPILLARY
Glucose-Capillary: 139 mg/dL — ABNORMAL HIGH (ref 70–99)
Glucose-Capillary: 111 mg/dL — ABNORMAL HIGH (ref 70–99)
Glucose-Capillary: 347 mg/dL — ABNORMAL HIGH (ref 70–99)
Glucose-Capillary: 203 mg/dL — ABNORMAL HIGH (ref 70–99)

## 2019-02-07 LAB — ECHOCARDIOGRAM COMPLETE
Height: 74 in
Weight: 3192.26 oz

## 2019-02-07 SURGERY — CANCELLED PROCEDURE

## 2019-02-07 MED ORDER — DIAZEPAM 5 MG PO TABS
10.0000 mg | ORAL_TABLET | Freq: Once | ORAL | Status: AC
  Administered 2019-02-07: 10 mg via ORAL
  Filled 2019-02-07: qty 2

## 2019-02-07 MED ORDER — SODIUM CHLORIDE 0.9 % IV SOLN
INTRAVENOUS | Status: DC
  Administered 2019-02-07: 11:00:00 via INTRAVENOUS

## 2019-02-07 NOTE — Interval H&P Note (Signed)
History and Physical Interval Note: EGD today to eval melena and acute anemia The nature of the procedure, as well as the risks, benefits, and alternatives were carefully and thoroughly reviewed with the patient. Ample time for discussion and questions allowed. The patient understood, was satisfied, and agreed to proceed.   CBC Latest Ref Rng & Units 02/07/2019 02/06/2019 02/06/2019  WBC 4.0 - 10.5 K/uL 6.8 - 5.8  Hemoglobin 13.0 - 17.0 g/dL 8.2(L) 7.5(L) 6.4(LL)  Hematocrit 39.0 - 52.0 % 26.0(L) 22.7(L) 20.3(L)  Platelets 150 - 400 K/uL 201 - 199     02/07/2019 11:21 AM  [name]  has presented today for surgery, with the diagnosis of melena.  The various methods of treatment have been discussed with the patient and family. After consideration of risks, benefits and other options for treatment, the patient has consented to  Procedure(s): ESOPHAGOGASTRODUODENOSCOPY (EGD) (N/A) as a surgical intervention.  The patient's history has been reviewed, patient examined, no change in status, stable for surgery.  I have reviewed the patient's chart and labs.  Questions were answered to the patient's satisfaction.     Jacob [name]

## 2019-02-07 NOTE — TOC Initial Note (Signed)
Transition of Care [facility]) - Initial/Assessment Note    Patient Details  Name: Jacob [name] MRN: [id number] Date of Birth: 1951/10/10  Transition of Care [facility]) CM/SW Contact:    Sherrilyn Rist Phone Number: [phone] 02/07/2019, 1:30 PM  Clinical Narrative:                 CM following for progression of care, no needs identified at this time.  Expected Discharge Plan: Home/Self Care Barriers to Discharge: No Barriers Identified   Patient Goals and CMS Choice Patient states their goals for this hospitalization and ongoing recovery are:: to go home CMS Medicare.gov Compare Post Acute Care list provided to:: Patient Choice offered to / list presented to : NA  Expected Discharge Plan and Services Expected Discharge Plan: Home/Self Care In-house Referral: NA Discharge Planning Services: CM Consult Post Acute Care Choice: NA Living arrangements for the past 2 months: Single Family Home                 DME Arranged: N/A DME Agency: NA HH Arranged: NA HH Agency: NA  Prior Living Arrangements/Services Living arrangements for the past 2 months: Single Family Home Lives with:: Self Patient language and need for interpreter reviewed:: No Do you feel safe going back to the place where you live?: Yes      Need for Family Participation in Patient Care: No (Comment) Care giver support system in place?: Yes (comment)   Criminal Activity/Legal Involvement Pertinent to Current Situation/Hospitalization: No - Comment as needed  Activities of Daily Living Home Assistive Devices/Equipment: Eyeglasses ADL Screening (condition at time of admission) Patient's cognitive ability adequate to safely complete daily activities?: Yes Is the patient deaf or have difficulty hearing?: No Does the patient have difficulty seeing, even when wearing glasses/contacts?: No Does the patient have difficulty concentrating, remembering, or making decisions?: No Patient able to express need  for assistance with ADLs?: Yes Does the patient have difficulty dressing or bathing?: No Independently performs ADLs?: Yes (appropriate for developmental age) Does the patient have difficulty walking or climbing stairs?: No Weakness of Legs: None Weakness of Arms/Hands: None  Permission Sought/Granted Permission sought to share information with : Case Manager Permission granted to share information with : Yes, Verbal Permission Granted  Share Information with NAME: Jacob [name]           Emotional Assessment Appearance:: Developmentally appropriate Attitude/Demeanor/Rapport: Complaining(Pt upset due to procedure being delayed) Affect (typically observed): Apprehensive Orientation: : Oriented to Self, Oriented to  Time, Oriented to Place, Oriented to Situation Alcohol / Substance Use: Not Applicable Psych Involvement: No (comment)  Admission diagnosis:  Low hemoglobin [D64.9] Patient Active Problem List   Diagnosis Date Noted  . Symptomatic anemia 02/06/2019  . CKD stage 4 due to type 2 diabetes mellitus ([place]) 05/30/2018  . Hyperlipidemia associated with type 2 diabetes mellitus ([place]) 05/30/2018  . Acute respiratory failure ([place]) 03/01/2018  . Respiratory distress 02/28/2018  . Acute respiratory distress 02/28/2018  . Right foot ulcer ([place]) 02/28/2018  . Chronic obstructive pulmonary disease ([place]) 02/28/2018  . Anemia of chronic disease 11/20/2016  . CKD (chronic kidney disease) stage 3, GFR 30-59 ml/min (HCC) 11/20/2016  . CHF (congestive heart failure) ([place]) 11/10/2016  . Severe sepsis ([place]) 08/02/2016  . Pneumonia 08/02/2016  . History of lumbar laminectomy for spinal cord decompression 02/29/2016  . Acute renal failure superimposed on stage 3 chronic kidney disease ([place]) 11/03/2015  . Dehydration 11/03/2015  . Alcohol dependence ([place]) 11/03/2015  . Type  2 diabetes mellitus with vascular disease ([place]) 06/17/2015  . HTN (hypertension) 04/26/2015  . Syncope 04/26/2015  . DM  (diabetes mellitus) ([place]) 04/26/2015  . Ischemic cardiomyopathy 05/12/2014  . Diarrhea 09/08/2013  . Shock ([place]) 09/06/2013  . Ulcer of lower extremity ([place]) 09/06/2013  . Community acquired pneumonia 09/06/2013  . Hyponatremia 09/06/2013  . Hyperkalemia 09/06/2013  . Hyperglycemia 09/06/2013  . Elevated transaminase level 09/06/2013  . Chest pain 09/06/2013  . Type II or unspecified type diabetes mellitus with unspecified complication, not stated as uncontrolled 10/22/2012  . Black stool 04/17/2012  . Bunion of left foot 11/25/2011  . Bunion, right foot 11/25/2011  . Polysubstance abuse ([place]) 07/28/2011  . Hip fracture, left ([place]) 04/04/2011  . Closed fracture of neck of femur ([place]) 04/04/2011  . Insomnia 02/18/2011  . CAD (coronary artery disease) 04/20/2009  . Acute on chronic combined systolic (congestive) and diastolic (congestive) heart failure ([place]) 04/20/2009  . HLD (hyperlipidemia) 11/20/2006  . Gout 11/20/2006  . TOBACCO ABUSE 11/20/2006  . Essential hypertension 11/20/2006  . Human immunodeficiency virus (HIV) disease ([place]) 09/02/2006  . SYPHILIS 09/02/2006   PCP:  Lauree , NP Pharmacy:   [facility], [place] AT [place] [place] [address] Phone: [phone] Fax: [phone]  [place], [place] [facility] [address] [place] [address] [place] [address] Phone: [phone] Fax: [phone]  [id number] - [place], [place] - [address] AT [place] [address] Phone: [phone] Fax: [phone]  [facility], [place] [address] [place] [address] Phone: [phone] Fax: [phone]     Social Determinants of Health (SDOH) Interventions    Readmission Risk Interventions No  flowsheet data found.

## 2019-02-07 NOTE — Progress Notes (Signed)
Patient brought for upper endoscopy to evaluate melenic stool with acute drop in hemoglobin. Hemoglobin improved and stable after 1 unit of packed red cells  After being evaluated by anesthesia, they recommend he be seen first by cardiology given his mild elevation in troponin, high risk stress test recently as well as plans for eventual cardiac catheterization.  I discussed this with anesthesia and this is very reasonable.  I have communicated this with Dr. Eliseo Squires who will contact cardiology.  We will await the recommendation but tentatively plan to perform endoscopy tomorrow.  Continue twice daily PPI.

## 2019-02-07 NOTE — Progress Notes (Signed)
[facility] PROGRESS NOTE  TREYLON HENARD IFO:[id number] DOB: 11/29/1950 DOA: 02/06/2019 PCP: [name], NP  Assessment/Plan:  Symptomatic anemia. Likely related to GI bleed. Recent melena. S/p 1 unit PRBC's. Hg 8.2 from 6.4 on admission. No further dizziness. Evaluated by GI who recommend EGD rule out PUD and AVM given hx CKD. EGD postponed today to get cardiac clearance. Likely tomorrow. Appreciate GI -serial Hg -transfuse as indicated -monitor  Active Problems  Melena/gi bleed. Intermittent over last several weeks. Denies nsaid use. Denies etoh. No further BM.  -see #1.  -egd for tomorrow -ppi  Chronic kidney disease stage V Current creatinine stable at baseline. Is slated for dialysis soon.  -hold nephrotoxins -monitor urine output  Elevated troponin. Trended up and then down. ?demand in setting of ckd.  Also recent high risk stress test with plans for cath next month. No chest pain. ekg without acute changes.  -cardiology consult for clearance per gi/anesthesia request  -cycle troponin -serial ekg  Type 2 diabetes mellitus, insulin-dependent fair control. A1c 6.8 11/19 -obtain A1c -Continue home Lantus at a lower dose - placed on sliding scale  Hypertension fair control. Home meds include lasix coreg imdur -Continue home medications  Hyperlipidemia -Continue atorvastatin  HIV -Continue home medications, CD4 count 1220, HIV RNA less than 20 in August 2019  Coronary artery disease -No chest pain, continue Imdur, Coreg, high-dose statin  Gout -Continue home medications   Code Status: full Family Communication: none present Disposition Plan: home    Consultants:  [facility]  [facility]  Procedures:    Antibiotics:    HPI/Subjective: Presented with intermittent melena, dizzy. Workup revealed anemia likely related to gi bleed. GI and cards on board. EGD 4/17  Denies pain/discomfort. Displeased with what his meds are  administered.   Objective: Vitals:   02/07/19 1056 02/07/19 1221  BP: (!) 145/68 (!) 155/82  Pulse: 68 72  Resp: 12 (!) 22  Temp: 98.7 F (37.1 C) 97.6 F (36.4 C)  SpO2: 100% 100%    Intake/Output Summary (Last 24 hours) at 02/07/2019 1358 Last data filed at 02/07/2019 1240 [place] per 24 hour  Intake 1166 ml  Output 2150 ml  Net -984 ml   Filed Weights   02/06/19 0942 02/06/19 1522 02/07/19 0330  Weight: 89.8 kg 91.2 kg 90.5 kg    Exam:   General:  Awake alert oriented sitting on side of bed in no acute distress. Somewhat cranky  Cardiovascular: rrr no mgr no LE edema  Respiratory: normal effort BS clear bilaterally no wheeze or rhonchi  Abdomen: soft +BS no guarding or rebounding  Musculoskeletal: joints without swelling/erythema   Data Reviewed: Basic Metabolic Panel: Recent Labs  Lab 02/06/19 1045 02/07/19 0642  NA 138 140  K 4.5 4.5  CL 108 107  CO2 17* 19*  GLUCOSE 263* 147*  BUN 99* 98*  CREATININE 5.74* 5.63*  CALCIUM 8.8* 9.1   Liver Function Tests: Recent Labs  Lab 02/06/19 1045 02/07/19 0642  AST 25 26  ALT 20 21  ALKPHOS 36* 40  BILITOT 0.5 0.9  PROT 7.1 7.6  ALBUMIN 3.3* 3.4*   Recent Labs  Lab 02/06/19 1045  LIPASE 110*   No results for input(s): AMMONIA in the last 168 hours. CBC: Recent Labs  Lab 02/06/19 1045 02/06/19 1817 02/07/19 0642  WBC 5.8  --  6.8  NEUTROABS 2.7  --   --   HGB 6.4* 7.5* 8.2*  HCT 20.3* 22.7* 26.0*  MCV 96.2  --  91.9  PLT 199  --  201   Cardiac Enzymes: Recent Labs  Lab 02/06/19 1045 02/06/19 1817 02/07/19 0040 02/07/19 0642  TROPONINI 0.48* 0.73* 0.67* 0.69*   BNP (last 3 results) Recent Labs    02/28/18 0552 02/06/19 1401  BNP 691.4* 852.1*    ProBNP (last 3 results) No results for input(s): PROBNP in the last 8760 hours.  CBG: Recent Labs  Lab 02/06/19 1627 02/06/19 2115 02/07/19 0628 02/07/19 1217  GLUCAP 163* 185* 139* 111*    No results found for this or any  previous visit (from the past 240 hour(s)).   Studies: Dg Chest 2 View  Result Date: 02/06/2019 CLINICAL DATA:  Low hemoglobin today.  Chest pain. EXAM: CHEST - 2 VIEW COMPARISON:  02/28/2018 FINDINGS: Sternotomy wires unchanged. Lungs are adequately inflated without focal airspace consolidation or effusion. Moderate stable cardiomegaly. Remainder of the exam is unchanged. IMPRESSION: No acute findings. Electronically Signed   By: Marin Olp M.D.   On: 02/06/2019 11:32    Scheduled Meds: . sodium chloride   Intravenous Once  . allopurinol  100 mg Oral Daily  . atorvastatin  80 mg Oral q1800  . bictegravir-emtricitabine-tenofovir AF  1 tablet Oral Daily  . carvedilol  25 mg Oral BID WC  . furosemide  120 mg Oral BID  . gabapentin  300 mg Oral TID  . hydrALAZINE  25 mg Oral TID  . insulin aspart  0-5 Units Subcutaneous QHS  . insulin aspart  0-9 Units Subcutaneous TID WC  . insulin glargine  50 Units Subcutaneous Daily  . isosorbide mononitrate  30 mg Oral Daily  . pantoprazole (PROTONIX) IV  40 mg Intravenous Q12H  . umeclidinium-vilanterol  1 puff Inhalation Daily   Continuous Infusions:  Principal Problem:   Symptomatic anemia Active Problems:   CAD (coronary artery disease)   Human immunodeficiency virus (HIV) disease (HCC)   Ischemic cardiomyopathy   HTN (hypertension)   Type 2 diabetes mellitus with vascular disease ([place])    Time spent: 27 minutes    [place] NP  [facility]  If 7PM-7AM, please contact night-coverage at [email or web address], password [facility] 02/07/2019, 1:58 PM  LOS: 0 days

## 2019-02-07 NOTE — Progress Notes (Signed)
Patient taken off tele monitor CCMD called notified and patient taken in wheelchair down for surgical procedure(upper endoscopy) report given to receiving nurse.

## 2019-02-07 NOTE — Anesthesia Preprocedure Evaluation (Signed)
Anesthesia Evaluation  Patient identified by MRN, date of birth, ID band Patient awake    Reviewed: Allergy & Precautions, NPO status , Patient's Chart, lab work & pertinent test results, reviewed documented beta blocker date and time   Airway        Dental   Pulmonary COPD, Current Smoker,           Cardiovascular hypertension, Pt. on medications and Pt. on home beta blockers + CAD, + Past MI, + CABG and +CHF    Stress Test 11/2018 Nuclear stress EF: 26%. Blood pressure demonstrated a normal response to exercise. There was no ST segment deviation noted during stress. Findings consistent with prior myocardial infarction. This is a high risk study. The left ventricular ejection fraction is severely decreased (<30%). LVE large inferior wall infarct from apex to base.  Anterolateral infarct from apex to base and apical lateral wall infarct  No ischemia  EF estimated at 26% diffuse hypokinesis   TTE 2018 - Compared to a prior study in 2018, the LVEF is unchanged at 45-50%, however, there is moderately reduced RV systolic function. LV filling pressure is severely elevated with restrictive diastolic filling pattern.   Neuro/Psych negative neurological ROS  negative psych ROS   GI/Hepatic GERD  ,(+)     substance abuse  cocaine use, Hepatitis -, B  Endo/Other  negative endocrine ROSdiabetes, Type 2, Insulin Dependent  Renal/GU Renal InsufficiencyRenal disease  negative genitourinary   Musculoskeletal  (+) Arthritis , Osteoarthritis,    Abdominal   Peds  Hematology  (+) Blood dyscrasia (Hgb 8.2), anemia , HIV,   Anesthesia Other Findings   Reproductive/Obstetrics                             Anesthesia Physical Anesthesia Plan  ASA: IV  Anesthesia Plan: General   Post-op Pain Management:    Induction: Intravenous  PONV Risk Score and Plan: 1 and Ondansetron and Treatment may vary  due to age or medical condition  Airway Management Planned: Oral ETT  Additional Equipment:   Intra-op Plan:   Post-operative Plan: Extubation in OR  Informed Consent: I have reviewed the patients History and Physical, chart, labs and discussed the procedure including the risks, benefits and alternatives for the proposed anesthesia with the patient or authorized representative who has indicated his/her understanding and acceptance.     Dental advisory given  Plan Discussed with: CRNA  Anesthesia Plan Comments:         Anesthesia Quick Evaluation

## 2019-02-07 NOTE — Progress Notes (Signed)
  Echocardiogram 2D Echocardiogram has been performed.  Jacob [name] 02/07/2019, 5:01 PM

## 2019-02-07 NOTE — Consult Note (Signed)
Cardiology Consultation:   [name] MRN: [id number]; DOB: [birth year]  Admit date: 02/06/2019 Date of Consult: 02/07/2019  Primary Care Provider: [name], NP Primary Cardiologist: No primary care provider on file.  Primary Electrophysiologist:  None   Patient Profile:   Jacob [name] is a 68 y.o. male with a hx of CAD s/p CABG, HTN, HL, CKD stage V, HIV, DM, ICM who is being seen today for the evaluation of pre-op evaluation at the request of Dr. Eliseo Squires.  History of Present Illness:   [name] is a 68 yo male with PMH noted above.  He is followed by Dr. [name] as an outpatient.  Underwent CABG in 2004, with EF noted at that time at 24% which subsequently improved to 45 to 50% after revascularization.  Last Myoview in 2016 showed scar with no acute ischemia.  Noted to have done well with medical therapy over the years and cath has been avoided due to worsening renal failure.  He is followed by Dr. [name] with nephrology and had a revision to a left arm fistula in January 2020.  Of note has not started hemodialysis yet.  Most recent echocardiogram on 5/19 showed EF of 45 to 50% with diffuse hypokinesis, and grade 3 diastolic dysfunction.  He was last seen in the office on 12/03/2018.  It was discussed that he needed a repeat Myoview given the need to initiate dialysis with the increased cardiac output and stress on his heart per Dr. Kyla Balzarine note.  Myoview done on 12/13/2018 showed a decline in his EF to 26% with large inferior wall infarct from apex to base, and anterior lateral infarct from apex to base.  No acute ischemia noted.  Recommendation from Dr. [name] was to undergo further ischemic evaluation with cardiac cath once placed on hemodialysis.  Patient presented to the ED on 4/15 with complaints of weakness, anemia and dark stools.  States over the past month he has been feeling weak and began to notice increasing fatigue with simple day-to-day activities.  Also  felt dyspneic at times as well.  Noted intermittent black stools over the past 2 weeks prior to admission 1-2 times a day.  Last dark stool was about 3 to 4 days prior to admission.  Does use aspirin and regards to his coronary disease but denied any other NSAID use.  In the ED his labs showed stable electrolytes, creatinine 5.7, troponin 0.48>>0.73>>0.67>>0.69, hemoglobin 6.4.  FOBT was negative.  EKG showed sinus rhythm with right bundle branch block and nonspecific T wave changes.  He was admitted to internal medicine and transfused.  GI consulted with recommendation for upper endoscopy which was p in lanned for today.  Given his cardiac risk factors it was recommended via anesthesia that he have a preop evaluation via cardiology prior to any invasive procedures.  Past Medical History:  Diagnosis Date  . Arthritis    "all over; mostly knees and back" (02/28/2018)  . CHF (congestive heart failure) (HCC)    not on any meds  . Chronic lower back pain    stenosis  . CKD (chronic kidney disease), stage V ([place])    [name] 02/28/2018  . Coronary atherosclerosis of native coronary artery 2005   s/p surgery  . Drug abuse ([place])    hx; tested for cocaine as recently as 2/08. says he is not using drugs now - avoided defib. for this reason   . GERD (gastroesophageal reflux disease)    takes OTC meds  as needed  . GI bleeding 02/06/2019  . Glaucoma    uses eye drops daily  . Hepatitis B 1968   "tx'd w/isolation; caught it from toilet stools in gym"  . History of colon polyps    benign  . History of gout    takes Allopurinol daily as well as Colchicine-if needed (02/28/2018)  . History of kidney stones   . HTN (hypertension)    takes Coreg,Imdur.and Apresoline daily  . Human immunodeficiency virus (HIV) disease ([place]) dx'd 1995   takes Genvoya daily  . Hyperlipidemia    takes Atorvastatin daily  . Ischemic cardiomyopathy   . Muscle spasm    takes Zanaflex as needed  . Myocardial infarction ([place]) ~  2004/2005  . Nocturia   . Peripheral neuropathy    takes gabapentin daily  . Pneumonia    "at least twice" (02/28/2018)  . Shortness of breath dyspnea    rarely but if notices it then with exertion  . Syphilis, unspecified   . Type II diabetes mellitus ([place]) 2004   Lantus daily.Average fasting blood sugar 125-199  . Wears glasses   . Wears partial dentures     Past Surgical History:  Procedure Laterality Date  . AV FISTULA PLACEMENT Left 08/02/2018   Procedure: ARTERIOVENOUS (AV) FISTULA CREATION  left arm radiocephlic;  Surgeon: Marty Heck, MD;  Location: [facility];  Service: Vascular;  Laterality: Left;  . BACK SURGERY    . CARDIAC CATHETERIZATION  10/2002; 12/19/2004   [name] 03/08/2011  . COLONOSCOPY  2013   Dr.John Henrene Pastor   . CORONARY ARTERY BYPASS GRAFT  02/24/2003   CABG X2/notes 03/08/2011  . FRACTURE SURGERY    . INTERTROCHANTERIC HIP FRACTURE SURGERY Left 11/2006   [name] 03/08/2011  . LAPAROSCOPIC CHOLECYSTECTOMY  05/2006  . LIGATION OF COMPETING BRANCHES OF ARTERIOVENOUS FISTULA Left 11/05/2018   Procedure: LIGATION OF COMPETING BRANCHES OF ARTERIOVENOUS FISTULA  LEFT  ARM;  Surgeon: Marty Heck, MD;  Location: [facility];  Service: Vascular;  Laterality: Left;  . LUMBAR LAMINECTOMY/DECOMPRESSION MICRODISCECTOMY N/A 02/29/2016   Procedure: Left L4-5 Lateral Recess Decompression, Removal Extradural Intraspinal Facet Cyst;  Surgeon: Marybelle Killings, MD;  Location: [facility];  Service: Orthopedics;  Laterality: N/A;  . MULTIPLE TOOTH EXTRACTIONS    . ORIF MANDIBULAR FRACTURE Left 08/13/2004   ORIF of left body fracture mandible with KLS Martin 2.3-mm six hole/notes 03/08/2011     Home Medications:  Prior to Admission medications   Medication Sig Start Date End Date Taking? Authorizing Provider  acetaminophen (TYLENOL) 500 MG tablet Take 500 mg by mouth every 6 (six) hours as needed for moderate pain.   Yes [provider]  allopurinol (ZYLOPRIM) 100 MG tablet Take  1 tablet (100 mg total) by mouth daily. 01/15/19  Yes [name], NP  aspirin EC 81 MG tablet Take 81 mg by mouth daily.   Yes [provider]  atorvastatin (LIPITOR) 80 MG tablet Take 1 tablet (80 mg total) by mouth daily. 12/05/18  Yes [name], NP  bictegravir-emtricitabine-tenofovir AF (BIKTARVY) 50-200-25 MG TABS tablet Take 1 tablet by mouth daily. 03/05/18  Yes [name], MD  cadexomer iodine (IODOSORB) 0.9 % gel Apply 1 application topically daily as needed for wound care. 11/29/18  Yes Marzetta Board, DPM  carvedilol (COREG) 25 MG tablet TAKE 1 TABLET BY MOUTH TWICE A DAY WITH MEALS Patient taking differently: Take 25 mg by mouth 2 (two) times daily with a meal.  07/19/18  Yes [name], MD  Cholecalciferol (VITAMIN D3) 50 MCG (2000 UT) TABS Take 2,000 Units by mouth daily.   Yes [provider]  Choline Fenofibrate (FENOFIBRIC ACID) 135 MG CPDR TAKE 1 CAPSULE BY MOUTH DAILY 01/21/19  Yes [name], NP  colchicine 0.6 MG tablet Take one tablet by mouth as needed for gout 02/23/18  Yes Gildardo Cranker, DO  furosemide (LASIX) 80 MG tablet Take 1.5 tablets (120 mg total) by mouth 2 (two) times daily. Patient taking differently: Take 160 mg by mouth 2 (two) times daily.  03/01/18  Yes Ghimire, Henreitta Leber, MD  gabapentin (NEURONTIN) 300 MG capsule TAKE 1 CAPSULE BY MOUTH THREE TIMES DAILY Patient taking differently: Take 300 mg by mouth 3 (three) times daily.  01/21/19  Yes [name], NP  Glucosamine-Chondroit-Vit C-Mn (GLUCOSAMINE CHONDR 1500 COMPLX PO) Take 2 tablets by mouth daily.    Yes [provider]  hydrALAZINE (APRESOLINE) 25 MG tablet Take 1 tablet (25 mg total) by mouth 3 (three) times daily. 01/21/19  Yes [name], NP  HYDROcodone-acetaminophen (NORCO) 5-325 MG tablet Take 1 tablet by mouth every 6 (six) hours as needed for moderate pain. 11/05/18  Yes Marty Heck, MD  insulin lispro (HUMALOG KWIKPEN)  100 UNIT/ML KiwkPen Inject 18 units subcutaneously three times daily before meals 05/31/18  Yes [name], Monica, DO  isosorbide mononitrate (IMDUR) 30 MG 24 hr tablet TAKE 1 TABLET(30 MG) BY MOUTH DAILY Patient taking differently: Take 30 mg by mouth daily.  01/15/19  Yes [name], NP  LANTUS SOLOSTAR 100 UNIT/ML Solostar Pen Inject 74 units subcutaneously every evening Patient taking differently: Inject 74 Units into the skin daily. Inject 74 units subcutaneously every morning 12/04/18  Yes Eubanks, [name], NP  latanoprost (XALATAN) 0.005 % ophthalmic solution Place 1 drop into both eyes at bedtime. Reported on 04/04/2016 04/23/12  Yes [provider]  nitroGLYCERIN (NITROSTAT) 0.3 MG SL tablet Place 1 tablet (0.3 mg total) under the tongue as needed for chest pain (not to exceed 3 tablets in one day.). 01/08/18  Yes [name], MD  sodium bicarbonate 650 MG tablet Take 650 mg by mouth 2 (two) times daily.  05/21/18  Yes [provider]  umeclidinium-vilanterol (ANORO ELLIPTA) 62.5-25 MCG/INH AEPB INHALE 1 PUFF BY MOUTH EVERY DAY 01/15/19  Yes [name], NP  B-D UF III MINI PEN NEEDLES 31G X 5 MM MISC use four times a day 03/13/18   [name], NP  Blood Glucose Monitoring Suppl (ONE TOUCH ULTRA MINI) w/Device KIT 1 each by Does not apply route 2 (two) times daily. Dx: E11.40 08/15/17   Gildardo Cranker, DO  gentamicin cream (GARAMYCIN) 0.1 % Apply 1 application topically daily. Foot Patient not taking: Reported on 02/06/2019 11/19/18   Edrick Kins, DPM  glucose blood test strip Use to test blood sugar three times daily. E11.40 06/15/18   Gildardo Cranker, DO  Lancets [facility] DELICA PLUS ALPFXT02I) [place] 1 each by Does not apply route 3 (three) times daily. Use to test blood sugar three times daily. Dx: E11.40 12/05/18   [name], NP    Inpatient Medications: Scheduled Meds: . sodium chloride   Intravenous Once  . allopurinol  100 mg Oral Daily  .  atorvastatin  80 mg Oral q1800  . bictegravir-emtricitabine-tenofovir AF  1 tablet Oral Daily  . carvedilol  25 mg Oral BID WC  . furosemide  120 mg Oral  BID  . gabapentin  300 mg Oral TID  . hydrALAZINE  25 mg Oral TID  . insulin aspart  0-5 Units Subcutaneous QHS  . insulin aspart  0-9 Units Subcutaneous TID WC  . insulin glargine  50 Units Subcutaneous Daily  . isosorbide mononitrate  30 mg Oral Daily  . pantoprazole (PROTONIX) IV  40 mg Intravenous Q12H  . umeclidinium-vilanterol  1 puff Inhalation Daily   Continuous Infusions:  PRN Meds: acetaminophen, HYDROcodone-acetaminophen, ondansetron **OR** ondansetron (ZOFRAN) IV  Allergies:    Allergies  Allergen Reactions  . Augmentin [Amoxicillin-Pot Clavulanate] Diarrhea    Severe diarrhea   . Amphetamines Other (See Comments)    Unknown reaction type    Social History:   Social History   Socioeconomic History  . Marital status: Single    Spouse name: Not on file  . Number of children: 1  . Years of education: Not on file  . Highest education level: Not on file  Occupational History  . Occupation: retired  . Occupation: part time- [employer]  . Financial resource strain: Not hard at all  . Food insecurity:    Worry: Never true    Inability: Never true  . Transportation needs:    Medical: No    Non-medical: No  Tobacco Use  . Smoking status: Current Every Day Smoker    Packs/day: 1.00    Years: 43.00    Pack years: 43.00    Types: Cigarettes  . Smokeless tobacco: Never Used  Substance and Sexual Activity  . Alcohol use: Yes    Alcohol/week: 12.0 standard drinks    Types: 12 Standard drinks or equivalent per week    Comment: 6x 24oz 8% ABV beers a week  . Drug use: No    Types: Cocaine    Comment: hx of crack/cocaine 44yr ago  . Sexual activity: Not Currently  Lifestyle  . Physical activity:    Days per week: 7 days    Minutes per session: 20 min  . Stress: Only a little  Relationships  .  Social connections:    Talks on phone: Three times a week    Gets together: Three times a week    Attends religious service: More than 4 times per year    Active member of club or organization: No    Attends meetings of clubs or organizations: Never    Relationship status: Never married  . Intimate partner violence:    Fear of current or ex partner: No    Emotionally abused: No    Physically abused: No    Forced sexual activity: No  Other Topics Concern  . Not on file  Social History Narrative   Retired.       As of 05/2015:   Diet: No salt    Caffeine   Married: Single    House: Condo, 2 stories, 1 person (self)    Pets: No    Current/Past profession: N/A   Exercise: walks daily    Living Will: Yes    DNR   POA/HPOA: No        Family History:    Family History  Problem Relation Age of Onset  . Heart failure Father   . Hypertension Father   . Diabetes Brother   . Heart attack Brother   . Alzheimer's disease Mother   . Stroke Sister   . Diabetes Sister   . Alzheimer's disease Sister   . Hypertension Brother   .  Diabetes Brother   . Drug abuse Brother   . Colon cancer Neg Hx      ROS:  Please see the history of present illness.   All other ROS reviewed and negative.     Physical Exam/Data:   Vitals:   02/07/19 0330 02/07/19 0754 02/07/19 1056 02/07/19 1221  BP: (!) 146/71  (!) 145/68 (!) 155/82  Pulse: 70  68 72  Resp: 20  12 (!) 22  Temp: 98.6 F (37 C)  98.7 F (37.1 C) 97.6 F (36.4 C)  TempSrc: Oral  Oral Oral  SpO2: 99% 97% 100% 100%  Weight: 90.5 kg     Height:        Intake/Output Summary (Last 24 hours) at 02/07/2019 1415 Last data filed at 02/07/2019 1240 Gross per 24 hour  Intake 1166 ml  Output 2150 ml  Net -984 ml   Last 3 Weights 02/07/2019 02/06/2019 02/06/2019  Weight (lbs) 199 lb 8.3 oz 201 lb 198 lb  Weight (kg) 90.5 kg 91.173 kg 89.812 kg     Body mass index is 25.62 kg/m.   Physical Exam per MD  General:  Pleasant  elderly male, in no acute distress HEENT: normal Lymph: no adenopathy Neck: no JVD Endocrine:  No thryomegaly Vascular: No carotid bruits; FA pulses 2+ bilaterally   Cardiac:  normal S1, S2; RRR; no murmur  Lungs:  clear to auscultation bilaterally, no wheezing, rhonchi or rales  Abd: soft, nontender, no hepatomegaly  Ext: no edema, left arm AV fistula Musculoskeletal:  No deformities, BUE and BLE strength normal and equal Skin: warm and dry  Neuro:  CNs 2-12 intact, no focal abnormalities noted Psych:  Normal affect   EKG:  The EKG was personally reviewed and demonstrates:  Normal sinus rhythm with right bundle branch block, ST and T change consider lateral ischemia  Telemetry:  Telemetry was personally reviewed and demonstrates: Normal sinus rhythm without significant arrhythmia  Relevant CV Studies:  TTE: 5/19  Study Conclusions  - Left ventricle: The cavity size was normal. Wall thickness was   normal. Systolic function was mildly reduced. The estimated   ejection fraction was in the range of 45% to 50%. Abnormal GLS at   -14%. Diffuse hypokinesis. Doppler parameters are consistent with   restrictive left ventricular relaxation (grade 3 diastolic   dysfunction). The E/A ratio is >2.0. The E/e&' ratio is >20,   suggesting markedly elevated LV filling pressure. - Mitral valve: Mildly thickened leaflets . There was mild   regurgitation. - Left atrium: Moderately dilated. - Right ventricle: The cavity size was mildly dilated. Moderately   reduced systolic function. Lateral annulus peak S velocity: 6.74   cm/s. - Right atrium: The atrium was normal in size.  Impressions:  - Compared to a prior study in 2018, the LVEF is unchanged at   45-50%, however, there is moderately reduced RV systolic   function. LV filling pressure is severely elevated with   restrictive diastolic filling pattern.  Stress Test: 12/13/2018    Nuclear stress EF: 26%.  Blood pressure  demonstrated a normal response to exercise.  There was no ST segment deviation noted during stress.  Findings consistent with prior myocardial infarction.  This is a high risk study.  The left ventricular ejection fraction is severely decreased (<30%).   LVE large inferior wall infarct from apex to base.  Anterolateral infarct from apex to base and apical lateral wall infarct  No ischemia  EF estimated at 26% diffuse  hypokinesis     Laboratory Data:  Chemistry Recent Labs  Lab 02/06/19 1045 02/07/19 0642  NA 138 140  K 4.5 4.5  CL 108 107  CO2 17* 19*  GLUCOSE 263* 147*  BUN 99* 98*  CREATININE 5.74* 5.63*  CALCIUM 8.8* 9.1  GFRNONAA 9* 9*  GFRAA 11* 11*  ANIONGAP 13 14    Recent Labs  Lab 02/06/19 1045 02/07/19 0642  PROT 7.1 7.6  ALBUMIN 3.3* 3.4*  AST 25 26  ALT 20 21  ALKPHOS 36* 40  BILITOT 0.5 0.9   Hematology Recent Labs  Lab 02/06/19 1045 02/06/19 1817 02/07/19 0642  WBC 5.8  --  6.8  RBC 2.11* 2.49* 2.83*  HGB 6.4* 7.5* 8.2*  HCT 20.3* 22.7* 26.0*  MCV 96.2  --  91.9  MCH 30.3  --  29.0  MCHC 31.5  --  31.5  RDW 14.0  --  15.7*  PLT 199  --  201   Cardiac Enzymes Recent Labs  Lab 02/06/19 1045 02/06/19 1817 02/07/19 0040 02/07/19 0642  TROPONINI 0.48* 0.73* 0.67* 0.69*   No results for input(s): TROPIPOC in the last 168 hours.  BNP Recent Labs  Lab 02/06/19 1401  BNP 852.1*    DDimer No results for input(s): DDIMER in the last 168 hours.  Radiology/Studies:  Dg Chest 2 View  Result Date: 02/06/2019 CLINICAL DATA:  Low hemoglobin today.  Chest pain. EXAM: CHEST - 2 VIEW COMPARISON:  02/28/2018 FINDINGS: Sternotomy wires unchanged. Lungs are adequately inflated without focal airspace consolidation or effusion. Moderate stable cardiomegaly. Remainder of the exam is unchanged. IMPRESSION: No acute findings. Electronically Signed   By: Marin Olp M.D.   On: 02/06/2019 11:32    Assessment and Plan:   DANNI LEABO is a 68  y.o. male with a hx of CAD s/p CABG, HTN, HL, CKD stage V, HIV, DM, ICM who is being seen today for the evaluation of pre-op evaluation at the request of Dr. Eliseo Squires.  1. Preop evaluation: Presented with weakness and dark stools. Found to have anemia with Hgb of 6.4. S/p transfusion of PRBCs with improvement to 8.2. Seen in consultation by GI and planned for upper endoscopy today but recommendation from anesthesia for cards consult prior to invasive procedure. Recent stress test with decline in EF at 24% but no ischemia (old scarring noted). Recommendation from PCP cardiologist for cardiac cath once established on HD. No reported chest pain. Will order echo to further assess this reported decline in EF.   2. Anemia: in the setting of reported melena. Hgb 6.4>>8.2 s/p transfusion. Planned for further GI evaluation with upper endoscopy.   3. CKD stage V: followed by Dr. [name], has a fistula in place but not currently on HD. Plan has been for cath once on HD given recent abnormal stress test.   4. Chronic combined HF: Since CABG in 2004 (24%),but improved to 45-50% on most recent echo in 5/19. Will recheck echo this admission to compare to stress images. -- continue BB therapy. No ACE/ARB 2/2 to renal disease  4. Elevated troponin: demand ischemia? in the setting anemia and CKD. Flat non ACS appearing trend.  5. HL: on statin   For questions or updates, please contact [place] Please consult [email or web address] for contact info under    Signed, Reino Bellis, NP  02/07/2019 2:15 PM  Patient seen, examined. Available data reviewed. Agree with findings, assessment, and plan as outlined by Reino Bellis, NP.  The physical exam  findings documented above reflect the findings of my personal examination of this patient today.  The patient reports no recent symptoms of chest pain, chest pressure, or exertional dyspnea.  He denies orthopnea or PND.  He does have some shortness of breath with bending  over.  I have personally reviewed the results of his stress test which demonstrated severe LV dysfunction and scar without significant ischemia.  He has a longstanding history of cardiomyopathy and he has known ischemic heart disease with previous CABG.  I reviewed Dr. Kyla Balzarine recommendations for consideration of cardiac catheterization once the patient is initiated on hemodialysis.  The patient appears stable from a cardiac perspective.  His low-level troponin elevation is likely related to demand ischemia in the setting of anemia and near end-stage renal disease.  There is no real peak and trough to his troponin and in fact the values are very flat.  There is clearly no evidence of acute coronary syndrome.  I think the patient would be at low cardiac risk to undergo a low risk procedure like upper endoscopy.  I did not recommend any further evaluation at this time.  Cardiac catheterization would obviously be complicated in this patient with a creatinine of 5 and would clearly push him over to requiring hemodialysis sooner rather than later.  He strongly prefers to defer cardiac catheterization at this time.  Please feel free to call if any other questions.  I think he can proceed with his EGD tomorrow as planned.  Sherren Mocha, M.D. 02/07/2019 3:15 PM

## 2019-02-07 NOTE — Progress Notes (Signed)
Patient currently safing cramps in left legs asked for mustard crackers for relief.

## 2019-02-07 NOTE — Care Management Obs Status (Signed)
[facility] NOTIFICATION   Patient Details  Name: TORRENCE HAMMACK MRN: [id number] Date of Birth: [birth year]   Medicare Observation Status Notification Given:  Yes(Pt refused to sign. Letter explained in detail, all questions answered)    Royston Bake, RN 02/07/2019, 1:25 PM

## 2019-02-08 ENCOUNTER — Encounter (HOSPITAL_COMMUNITY)
Admission: EM | Disposition: A | Discharge status: Home / Self Care | Payer: Self-pay | Source: Home / Self Care | Attending: Internal Medicine

## 2019-02-08 ENCOUNTER — Inpatient Hospital Stay (HOSPITAL_COMMUNITY): Payer: Medicare Other | Admitting: Certified Registered Nurse Anesthetist

## 2019-02-08 ENCOUNTER — Encounter (HOSPITAL_COMMUNITY): Payer: Self-pay

## 2019-02-08 DIAGNOSIS — I2510 Atherosclerotic heart disease of native coronary artery without angina pectoris: Secondary | ICD-10-CM

## 2019-02-08 DIAGNOSIS — I10 Essential (primary) hypertension: Secondary | ICD-10-CM

## 2019-02-08 DIAGNOSIS — I255 Ischemic cardiomyopathy: Secondary | ICD-10-CM

## 2019-02-08 DIAGNOSIS — B20 Human immunodeficiency virus [HIV] disease: Secondary | ICD-10-CM

## 2019-02-08 DIAGNOSIS — K921 Melena: Principal | ICD-10-CM

## 2019-02-08 DIAGNOSIS — D62 Acute posthemorrhagic anemia: Secondary | ICD-10-CM

## 2019-02-08 DIAGNOSIS — Q2730 Arteriovenous malformation, site unspecified: Secondary | ICD-10-CM

## 2019-02-08 DIAGNOSIS — K31819 Angiodysplasia of stomach and duodenum without bleeding: Secondary | ICD-10-CM

## 2019-02-08 DIAGNOSIS — E1159 Type 2 diabetes mellitus with other circulatory complications: Secondary | ICD-10-CM

## 2019-02-08 HISTORY — PX: HOT HEMOSTASIS: SHX5433

## 2019-02-08 HISTORY — PX: ESOPHAGOGASTRODUODENOSCOPY (EGD) WITH PROPOFOL: SHX5813

## 2019-02-08 LAB — GLUCOSE, CAPILLARY
Glucose-Capillary: 148 mg/dL — ABNORMAL HIGH (ref 70–99)
Glucose-Capillary: 116 mg/dL — ABNORMAL HIGH (ref 70–99)
Glucose-Capillary: 201 mg/dL — ABNORMAL HIGH (ref 70–99)
Glucose-Capillary: 259 mg/dL — ABNORMAL HIGH (ref 70–99)

## 2019-02-08 LAB — HEMOGLOBIN A1C
Hgb A1c MFr Bld: 7.3 % — ABNORMAL HIGH (ref 4.8–5.6)
Mean Plasma Glucose: 162.81 mg/dL

## 2019-02-08 LAB — CBC
HCT: 26.1 % — ABNORMAL LOW (ref 39.0–52.0)
Hemoglobin: 8 g/dL — ABNORMAL LOW (ref 13.0–17.0)
MCH: 29.1 pg (ref 26.0–34.0)
MCHC: 30.7 g/dL (ref 30.0–36.0)
MCV: 94.9 fL (ref 80.0–100.0)
Platelets: 195 10*3/uL (ref 150–400)
RBC: 2.75 MIL/uL — ABNORMAL LOW (ref 4.22–5.81)
RDW: 15.4 % (ref 11.5–15.5)
WBC: 7 10*3/uL (ref 4.0–10.5)
nRBC: 0 % (ref 0.0–0.2)

## 2019-02-08 SURGERY — ESOPHAGOGASTRODUODENOSCOPY (EGD) WITH PROPOFOL
Anesthesia: General

## 2019-02-08 MED ORDER — PROPOFOL 10 MG/ML IV BOLUS
INTRAVENOUS | Status: DC | PRN
  Administered 2019-02-08: 100 mg via INTRAVENOUS

## 2019-02-08 MED ORDER — ONDANSETRON HCL 4 MG/2ML IJ SOLN
INTRAMUSCULAR | Status: DC | PRN
  Administered 2019-02-08: 4 mg via INTRAVENOUS

## 2019-02-08 MED ORDER — SODIUM CHLORIDE 0.9 % IV SOLN
INTRAVENOUS | Status: DC
  Administered 2019-02-08: 10 mL via INTRAVENOUS

## 2019-02-08 MED ORDER — LATANOPROST 0.005 % OP SOLN
1.0000 [drp] | Freq: Every day | OPHTHALMIC | Status: DC
  Administered 2019-02-08: 1 [drp] via OPHTHALMIC
  Filled 2019-02-08: qty 2.5

## 2019-02-08 MED ORDER — PHENYLEPHRINE HCL (PRESSORS) 10 MG/ML IV SOLN
INTRAVENOUS | Status: DC | PRN
  Administered 2019-02-08 (×3): 120 ug via INTRAVENOUS
  Administered 2019-02-08: 80 ug via INTRAVENOUS

## 2019-02-08 MED ORDER — LOPERAMIDE HCL 1 MG/7.5ML PO SUSP
4.0000 mg | Freq: Once | ORAL | Status: AC
  Administered 2019-02-08: 4 mg via ORAL
  Filled 2019-02-08: qty 30

## 2019-02-08 MED ORDER — SUCCINYLCHOLINE CHLORIDE 200 MG/10ML IV SOSY
PREFILLED_SYRINGE | INTRAVENOUS | Status: DC | PRN
  Administered 2019-02-08: 200 mg via INTRAVENOUS

## 2019-02-08 MED ORDER — MUSCLE RUB 10-15 % EX CREA
TOPICAL_CREAM | CUTANEOUS | Status: DC | PRN
  Filled 2019-02-08: qty 85

## 2019-02-08 SURGICAL SUPPLY — 15 items

## 2019-02-08 NOTE — Anesthesia Preprocedure Evaluation (Signed)
Anesthesia Evaluation  Patient identified by MRN, date of birth, ID band Patient awake    Reviewed: Allergy & Precautions  Airway Mallampati: II  TM Distance: >3 FB     Dental   Pulmonary shortness of breath, COPD, Current Smoker,    breath sounds clear to auscultation       Cardiovascular hypertension, + Past MI, + Peripheral Vascular Disease and +CHF   Rhythm:Regular Rate:Normal     Neuro/Psych    GI/Hepatic GERD  ,(+) Hepatitis -  Endo/Other  diabetes  Renal/GU Renal disease     Musculoskeletal   Abdominal   Peds  Hematology   Anesthesia Other Findings   Reproductive/Obstetrics                             Anesthesia Physical Anesthesia Plan  ASA: IV  Anesthesia Plan: General   Post-op Pain Management:    Induction: Intravenous  PONV Risk Score and Plan: Ondansetron and Treatment may vary due to age or medical condition  Airway Management Planned: Oral ETT  Additional Equipment:   Intra-op Plan:   Post-operative Plan: Possible Post-op intubation/ventilation  Informed Consent: I have reviewed the patients History and Physical, chart, labs and discussed the procedure including the risks, benefits and alternatives for the proposed anesthesia with the patient or authorized representative who has indicated his/her understanding and acceptance.     Dental advisory given  Plan Discussed with: CRNA and Anesthesiologist  Anesthesia Plan Comments:         Anesthesia Quick Evaluation

## 2019-02-08 NOTE — Interval H&P Note (Signed)
History and Physical Interval Note:  02/08/2019 10:48 AM  Jacob [name]  has presented today for surgery, with the diagnosis of anemia.  The various methods of treatment have been discussed with the patient and family. After consideration of risks, benefits and other options for treatment, the patient has consented to  Procedure(s): ESOPHAGOGASTRODUODENOSCOPY (EGD) WITH PROPOFOL (N/A) as a surgical intervention.  The patient's history has been reviewed, patient examined, no change in status, stable for surgery.  I have reviewed the patient's chart and labs.  Questions were answered to the patient's satisfaction.     Milus Banister

## 2019-02-08 NOTE — Anesthesia Postprocedure Evaluation (Signed)
Anesthesia Post Note  Patient: Jacob [name]  Procedure(s) Performed: ESOPHAGOGASTRODUODENOSCOPY (EGD) WITH PROPOFOL (N/A ) HOT HEMOSTASIS (ARGON PLASMA COAGULATION/BICAP) (N/A )     Patient location during evaluation: Endoscopy Anesthesia Type: General Level of consciousness: awake Pain management: pain level controlled Vital Signs Assessment: post-procedure vital signs reviewed and stable Respiratory status: spontaneous breathing Cardiovascular status: stable Postop Assessment: no apparent nausea or vomiting Anesthetic complications: no    Last Vitals:  Vitals:   02/08/19 1200 02/08/19 1233  BP: (!) 135/59 130/66  Pulse: 70 65  Resp: 16 20  Temp:  36.5 C  SpO2: 98% 100%    Last Pain:  Vitals:   02/08/19 1233  TempSrc: Oral  PainSc:                  Margarita Croke

## 2019-02-08 NOTE — Anesthesia Procedure Notes (Signed)
Procedure Name: Intubation Date/Time: 02/08/2019 11:10 AM Performed by: Kathryne Hitch, CRNA Pre-anesthesia Checklist: Patient identified, Emergency Drugs available, Suction available, Patient being monitored and Timeout performed Patient Re-evaluated:Patient Re-evaluated prior to induction Oxygen Delivery Method: Circle system utilized Preoxygenation: Pre-oxygenation with 100% oxygen Induction Type: IV induction and Rapid sequence Laryngoscope Size: Mac and 4 Grade View: Grade I Tube type: Oral Tube size: 7.5 mm Number of attempts: 1 Airway Equipment and Method: Patient positioned with wedge pillow Placement Confirmation: ETT inserted through vocal cords under direct vision,  positive ETCO2 and breath sounds checked- equal and bilateral Secured at: 22 cm Tube secured with: Tape Dental Injury: Teeth and Oropharynx as per pre-operative assessment

## 2019-02-08 NOTE — Transfer of Care (Signed)
Immediate Anesthesia Transfer of Care Note  Patient: ZYRUS HETLAND  Procedure(s) Performed: ESOPHAGOGASTRODUODENOSCOPY (EGD) WITH PROPOFOL (N/A ) HOT HEMOSTASIS (ARGON PLASMA COAGULATION/BICAP) (N/A )  Patient Location: Short Stay  Anesthesia Type:General  Level of Consciousness: drowsy and patient cooperative  Airway & Oxygen Therapy: Patient Spontanous Breathing and Patient connected to face mask oxygen  Post-op Assessment: Report given to RN and Post -op Vital signs reviewed and stable  Post vital signs: Reviewed and stable  Last Vitals:  Vitals Value Taken Time  BP    Temp    Pulse 70 02/08/2019 11:41 AM  Resp 14 02/08/2019 11:41 AM  SpO2 100 % 02/08/2019 11:41 AM  Vitals shown include unvalidated device data.  Last Pain:  Vitals:   02/08/19 1052  TempSrc:   PainSc: 0-No pain         Complications: No apparent anesthesia complications

## 2019-02-08 NOTE — Progress Notes (Addendum)
[facility] PROGRESS NOTE  Jacob [name] EXH:[id number] DOB: June 19, 1951 DOA: 02/06/2019 PCP: [name], NP  Assessment/Plan:  Symptomatic anemia. Likely related to GI bleed. Recent melena. S/p 1 unit PRBC's Hg 8.0 from 6.4 on admission. No further dizziness. Evaluated by GI who recommend EGD rule out PUD and AVM given hx CKD. EGD results few avm that gi opines not likely responsible for melena or anemia. Recommend colonoscopy tomorrow but patient refuses . Using general anesthesia and patient lives alone so anticipate discharge in am . -follow Hg -transfuse as indicated -monitor  Active Problems  Melena/gi bleed. Intermittent over last several weeks. Denies nsaid use. Denies etoh. No further BM during this hospitalization -see #1.  -egd results few AVMs that presumed not contributing to melena or anemia. Colonoscopy recommended but patient refuses. Follow up 2-3 with gi.  -discharge in am -ppi  Chronic kidney disease stage V Current creatinine stable at baseline. Is slated for dialysis soon.  -bmet in am -hold nephrotoxins -monitor urine output  Elevated troponin. Trended up and then down. ?demand in setting of ckd.  Also recent high risk stress test with plans for cath next month. No chest pain. ekg without acute changes.  -cardiology consulted for clearance and cleared. See consult note  Type 2 diabetes mellitus, insulin-dependent fair control. A1c 6.8 11/19 and 7.3 today -Continue home Lantus at a lower dose - placed on sliding scale -OP follow up for optimal control  Hypertension fair control. Home meds include lasix coreg imdur -Continue home medications  Hyperlipidemia -Continue atorvastatin  HIV -Continue home medications, CD4 count 1220, HIV RNA less than 20 in August 2019  Coronary artery disease -No chest pain, continue Imdur, Coreg, high-dose statin  Gout -Continue home medications   Code Status: full Family Communication: none  present Disposition Plan: home likely tomorrow given use of general anesthesia and pt lives alone   Consultants:  danial jac[facility]  Procedures:  egd 4/17  Antibiotics:    HPI/Subjective: Sitting on side of bed somewhat irritable. "ready to get out of here". Denies pain/discomfort  Objective: Vitals:   02/08/19 0912 02/08/19 0953  BP: 137/72 (!) 150/74  Pulse: 63 68  Resp: 18 13  Temp: 98.8 F (37.1 C) 98.1 F (36.7 C)  SpO2: 100% 100%    Intake/Output Summary (Last 24 hours) at 02/08/2019 1113 Last data filed at 02/08/2019 0500 Gross per 24 hour  Intake 840 ml  Output 1800 ml  Net -960 ml   Filed Weights   02/07/19 0330 02/08/19 0535 02/08/19 0953  Weight: 90.5 kg 90.1 kg 90 kg    Exam:   General:  Awake alert no acute distress  Cardiovascular: rrr no mgr no LE edema  Respiratory: normal effort BS distant. Faint crackle left base otherwise clear  Abdomen: non-distended non-tender +BS no guarding or rebounding  Musculoskeletal: joints without swelling/erythema   Data Reviewed: Basic Metabolic Panel: Recent Labs  Lab 02/06/19 1045 02/07/19 0642 02/07/19 1956  NA 138 140  --   K 4.5 4.5 4.4  CL 108 107  --   CO2 17* 19*  --   GLUCOSE 263* 147*  --   BUN 99* 98*  --   CREATININE 5.74* 5.63*  --   CALCIUM 8.8* 9.1  --    Liver Function Tests: Recent Labs  Lab 02/06/19 1045 02/07/19 0642  AST 25 26  ALT 20 21  ALKPHOS 36* 40  BILITOT 0.5 0.9  PROT 7.1 7.6  ALBUMIN 3.3* 3.4*   Recent Labs  Lab 02/06/19 1045  LIPASE 110*   No results for input(s): AMMONIA in the last 168 hours. CBC: Recent Labs  Lab 02/06/19 1045 02/06/19 1817 02/07/19 0642 02/08/19 0509  WBC 5.8  --  6.8 7.0  NEUTROABS 2.7  --   --   --   HGB 6.4* 7.5* 8.2* 8.0*  HCT 20.3* 22.7* 26.0* 26.1*  MCV 96.2  --  91.9 94.9  PLT 199  --  201 195   Cardiac Enzymes: Recent Labs  Lab 02/06/19 1045 02/06/19 1817 02/07/19 0040  02/07/19 0642  TROPONINI 0.48* 0.73* 0.67* 0.69*   BNP (last 3 results) Recent Labs    02/28/18 0552 02/06/19 1401  BNP 691.4* 852.1*    ProBNP (last 3 results) No results for input(s): PROBNP in the last 8760 hours.  CBG: Recent Labs  Lab 02/07/19 0628 02/07/19 1217 02/07/19 1726 02/07/19 2032 02/08/19 0618  GLUCAP 139* 111* 347* 203* 148*    No results found for this or any previous visit (from the past 240 hour(s)).   Studies: Dg Chest 2 View  Result Date: 02/06/2019 CLINICAL DATA:  Low hemoglobin today.  Chest pain. EXAM: CHEST - 2 VIEW COMPARISON:  02/28/2018 FINDINGS: Sternotomy wires unchanged. Lungs are adequately inflated without focal airspace consolidation or effusion. Moderate stable cardiomegaly. Remainder of the exam is unchanged. IMPRESSION: No acute findings. Electronically Signed   By: Marin Olp M.D.   On: 02/06/2019 11:32    Scheduled Meds: . [MAR Hold] sodium chloride   Intravenous Once  . [MAR Hold] allopurinol  100 mg Oral Daily  . [MAR Hold] atorvastatin  80 mg Oral q1800  . [MAR Hold] bictegravir-emtricitabine-tenofovir AF  1 tablet Oral Daily  . [MAR Hold] carvedilol  25 mg Oral BID WC  . [MAR Hold] furosemide  120 mg Oral BID  . [MAR Hold] gabapentin  300 mg Oral TID  . [MAR Hold] hydrALAZINE  25 mg Oral TID  . [MAR Hold] insulin aspart  0-5 Units Subcutaneous QHS  . [MAR Hold] insulin aspart  0-9 Units Subcutaneous TID WC  . [MAR Hold] insulin glargine  50 Units Subcutaneous Daily  . [MAR Hold] isosorbide mononitrate  30 mg Oral Daily  . [MAR Hold] pantoprazole (PROTONIX) IV  40 mg Intravenous Q12H  . [MAR Hold] umeclidinium-vilanterol  1 puff Inhalation Daily   Continuous Infusions: . sodium chloride 10 mL (02/08/19 0956)    Principal Problem:   Symptomatic anemia Active Problems:   CAD (coronary artery disease)   Melena   Human immunodeficiency virus (HIV) disease (HCC)   Ischemic cardiomyopathy   HTN (hypertension)   Type  2 diabetes mellitus with vascular disease ([place])    Time spent: 77 minutes    [place] NP  [facility]  If 7PM-7AM, please contact night-coverage at [email or web address], password [facility] 02/08/2019, 11:13 AM  LOS: 1 day

## 2019-02-08 NOTE — Op Note (Signed)
[facility] Patient Name: Jacob [name] Procedure Date : 02/08/2019 MRN: [id number] Attending MD: Milus Banister , MD Date of Birth: 02-10-1951 CSN: [id number] Age: 68 Admit Type: Inpatient Procedure:                Upper GI endoscopy Indications:              melena, acute on chronic anemia Providers:                Milus Banister, MD, Vista Lawman, RN, Ladona Ridgel, Technician, Charolette Child, Technician,                            Haze Boyden, Referring MD:              Medicines:                General Anesthesia Complications:            No immediate complications. Estimated blood loss:                            None. Estimated Blood Loss:     Estimated blood loss: none. Procedure:                Pre-Anesthesia Assessment:                           - Prior to the procedure, a History and Physical                            was performed, and patient medications and                            allergies were reviewed. The patient's tolerance of                            previous anesthesia was also reviewed. The risks                            and benefits of the procedure and the sedation                            options and risks were discussed with the patient.                            All questions were answered, and informed consent                            was obtained. Prior Anticoagulants: The patient has                            taken no previous anticoagulant or antiplatelet                            agents. ASA Grade  Assessment: III - A patient with                            severe systemic disease. After reviewing the risks                            and benefits, the patient was deemed in                            satisfactory condition to undergo the procedure.                           After obtaining informed consent, the endoscope was                            passed under direct vision. Throughout  the                            procedure, the patient's blood pressure, pulse, and                            oxygen saturations were monitored continuously. The                            [id number]-H190 ([id number]) Olympus gastroscope was                            introduced through the mouth, and advanced to the                            second part of duodenum. The upper GI endoscopy was                            accomplished without difficulty. The patient                            tolerated the procedure well. Scope In: Scope Out: Findings:      There were a few small AVMs in the stomach and duodenal bulb (4-5       total). I used APC to destroy of the AVMs and none bled during treatment.      Mild duodenitis.      The exam was otherwise without abnormality. Impression:               There were a few small AVMs in the stomach and                            duodenal bulb (4-5 total). I used APC to destroy of                            the AVMs and none bled during treatment.                           Mild duodenitis.  It is doubtful to me that the AVMs noted above are                            responsible for his melena and acute on chronic                            anemia and so I recommended that he have a                            colonoscopy tomorrow however he adamantly declined. Recommendation:           - Return patient to hospital ward for ongoing care.                           - Follow overnight and if no recurrent overt                            bleeding he will be OK to discharge tomorrow (he                            should call [facility] for a follow up visit with                            Dr. Henrene Pastor in 2-3 weeks in the office).                           - Please call or page if any further questions or                            concers. Procedure Code(s):        --- Professional ---                           [phone],  Esophagogastroduodenoscopy, flexible,                            transoral; with ablation of tumor(s), polyp(s), or                            other lesion(s) (includes pre- and post-dilation                            and guide wire passage, when performed) Diagnosis Code(s):        --- Professional ---                           V42.595, Angiodysplasia of stomach and duodenum                            without bleeding                           D62, Acute posthemorrhagic anemia  K92.1, Melena (includes Hematochezia) CPT copyright 2019 [employer]. All rights reserved. The codes documented in this report are preliminary and upon coder review may  be revised to meet current compliance requirements. Milus Banister, MD 02/08/2019 11:59:31 AM This report has been signed electronically. Number of Addenda: 0

## 2019-02-09 DIAGNOSIS — E11621 Type 2 diabetes mellitus with foot ulcer: Secondary | ICD-10-CM

## 2019-02-09 DIAGNOSIS — I5042 Chronic combined systolic (congestive) and diastolic (congestive) heart failure: Secondary | ICD-10-CM

## 2019-02-09 DIAGNOSIS — L97419 Non-pressure chronic ulcer of right heel and midfoot with unspecified severity: Secondary | ICD-10-CM

## 2019-02-09 LAB — CBC
HCT: 25.8 % — ABNORMAL LOW (ref 39.0–52.0)
Hemoglobin: 8.1 g/dL — ABNORMAL LOW (ref 13.0–17.0)
MCH: 29.3 pg (ref 26.0–34.0)
MCHC: 31.4 g/dL (ref 30.0–36.0)
MCV: 93.5 fL (ref 80.0–100.0)
Platelets: 211 10*3/uL (ref 150–400)
RBC: 2.76 MIL/uL — ABNORMAL LOW (ref 4.22–5.81)
RDW: 15 % (ref 11.5–15.5)
WBC: 5.9 10*3/uL (ref 4.0–10.5)
nRBC: 0 % (ref 0.0–0.2)

## 2019-02-09 LAB — BASIC METABOLIC PANEL
Anion gap: 11 (ref 5–15)
BUN: 91 mg/dL — ABNORMAL HIGH (ref 8–23)
CO2: 20 mmol/L — ABNORMAL LOW (ref 22–32)
Calcium: 8.9 mg/dL (ref 8.9–10.3)
Chloride: 108 mmol/L (ref 98–111)
Creatinine, Ser: 5.08 mg/dL — ABNORMAL HIGH (ref 0.61–1.24)
GFR calc Af Amer: 12 mL/min — ABNORMAL LOW (ref >60)
GFR calc non Af Amer: 11 mL/min — ABNORMAL LOW (ref >60)
Glucose, Bld: 163 mg/dL — ABNORMAL HIGH (ref 70–99)
Potassium: 4.7 mmol/L (ref 3.5–5.1)
Sodium: 139 mmol/L (ref 135–145)

## 2019-02-09 LAB — GLUCOSE, CAPILLARY: Glucose-Capillary: 150 mg/dL — ABNORMAL HIGH (ref 70–99)

## 2019-02-09 MED ORDER — ISOSORBIDE MONONITRATE ER 30 MG PO TB24: 30.0000 mg | ORAL_TABLET | Freq: Every day | ORAL | Status: DC

## 2019-02-09 MED ORDER — PANTOPRAZOLE SODIUM 40 MG PO TBEC: 40.0000 mg | DELAYED_RELEASE_TABLET | Freq: Every day | ORAL | 0 refills | Status: DC

## 2019-02-09 NOTE — Discharge Summary (Signed)
Jacob [name], is a 68 y.o. male  DOB 1951-06-30  MRN [id number].  Admission date:  02/06/2019  Admitting Physician  Caren Griffins, MD  Discharge Date:  02/09/2019   Primary MD  [name], NP  Recommendations for primary care physician for things to follow:   Need of colonoscopy in the future   Discharge Diagnosis  Principal Problem:   Symptomatic anemia Active Problems:   Human immunodeficiency virus (HIV) disease ([place])   CAD (coronary artery disease)   Ischemic cardiomyopathy   HTN (hypertension)   Type 2 diabetes mellitus with vascular disease ([place])   Melena   Gastric AVM   Diabetic foot ulcer ([place])      Past Medical History:  Diagnosis Date   Arthritis    "all over; mostly knees and back" (02/28/2018)   CHF (congestive heart failure) ([place])    not on any meds   Chronic lower back pain    stenosis   CKD (chronic kidney disease), stage V ([place])    [name] 02/28/2018   Coronary atherosclerosis of native coronary artery 2005   s/p surgery   Drug abuse ([place])    hx; tested for cocaine as recently as 2/08. says he is not using drugs now - avoided defib. for this reason    GERD (gastroesophageal reflux disease)    takes OTC meds as needed   GI bleeding 02/06/2019   Glaucoma    uses eye drops daily   Hepatitis B 1968   "tx'd w/isolation; caught it from toilet stools in gym"   History of colon polyps    benign   History of gout    takes Allopurinol daily as well as Colchicine-if needed (02/28/2018)   History of kidney stones    HTN (hypertension)    takes Coreg,Imdur.and Apresoline daily   Human immunodeficiency virus (HIV) disease ([place]) dx'd 1995   takes Genvoya daily   Hyperlipidemia    takes Atorvastatin daily   Ischemic cardiomyopathy    Muscle spasm    takes Zanaflex as  needed   Myocardial infarction ([place]) ~ 2004/2005   Nocturia    Peripheral neuropathy    takes gabapentin daily   Pneumonia    "at least twice" (02/28/2018)   Shortness of breath dyspnea    rarely but if notices it then with exertion   Syphilis, unspecified    Type II diabetes mellitus ([place]) 2004   Lantus daily.Average fasting blood sugar 125-199   Wears glasses    Wears partial dentures     Past Surgical History:  Procedure Laterality Date   AV FISTULA PLACEMENT Left 08/02/2018   Procedure: ARTERIOVENOUS (AV) FISTULA CREATION  left arm radiocephlic;  Surgeon: Marty Heck, MD;  Location: [facility] OR;  Service: Vascular;  Laterality: Left;   BACK SURGERY     CARDIAC CATHETERIZATION  10/2002; 12/19/2004   [name] 03/08/2011   COLONOSCOPY  2013   [place]    CORONARY ARTERY BYPASS GRAFT  02/24/2003   CABG X2/notes 03/08/2011  FRACTURE SURGERY     INTERTROCHANTERIC HIP FRACTURE SURGERY Left 11/2006   [name] 03/08/2011   LAPAROSCOPIC CHOLECYSTECTOMY  05/2006   LIGATION OF COMPETING BRANCHES OF ARTERIOVENOUS FISTULA Left 11/05/2018   Procedure: LIGATION OF COMPETING BRANCHES OF ARTERIOVENOUS FISTULA  LEFT  ARM;  Surgeon: Marty Heck, MD;  Location: [facility] OR;  Service: Vascular;  Laterality: Left;   LUMBAR LAMINECTOMY/DECOMPRESSION MICRODISCECTOMY N/A 02/29/2016   Procedure: Left L4-5 Lateral Recess Decompression, Removal Extradural Intraspinal Facet Cyst;  Surgeon: Marybelle Killings, MD;  Location: [facility];  Service: Orthopedics;  Laterality: N/A;   MULTIPLE TOOTH EXTRACTIONS     ORIF MANDIBULAR FRACTURE Left 08/13/2004   ORIF of left body fracture mandible with KLS Martin 2.3-mm six hole/notes 03/08/2011       HPI  from the history and physical done on the day of admission:  Jacob [name] is a 68 y.o. male with medical history significant of chronic kidney disease stage V not currently on dialysis (AV fistula placed), diabetes mellitus, coronary artery  disease, HIV, hypertension, hyperlipidemia, presents to the hospital with chief complaint of being sent from his PCP due to a hemoglobin in the 6 range.  Patient tells me that he has been feeling a little bit weak over the last month, has noticed fatigue and tiredness every time he tries to walk just a few feet.  He also feels a little bit dyspneic with that.  He reports that he has been seeing intermittent black stools over the last 2 weeks, on and off he sees black stools 1 to 2 days at a time.  Last time was about 3 to 4 days ago.  He only takes aspirin and denies any other NSAIDs.  He denies any nausea or vomiting.  He denies any abdominal pain.  Black stools have also been associated with slightly loose stools.  He denies any fever or chills, denies any chest pain, denies any palpitations.  ED Course: In the emergency room his vital signs are stable, he is afebrile, normotensive, satting 100% on room air.  Blood work reveals a BUN of 9 9 and a creatinine of 5.7, bicarb 17.  Potassium is 4.5.  Initial troponin 0 0.48.  Hemoglobin was found to be 6.4.   Hospital Course:   1.  Symptomatic anemia, Melena, GI bleed, AVMs, duodenitis. Likely related to GI bleed.  Patient noted symptoms intermittent over last several weeks. Denied nsaid use or alcohol abuse.  No further bowel movement during this hospitalization noted with blood recent melena. S/p 1 unit PRBC's Hg 8.0 from 6.4 on admission. No further dizziness. Evaluated by GI who recommend EGD rule out PUD and AVM given hx CKD. EGD initially postponed due to concern history of cardiac disease.  Cardiology cleared patient.  EGD on 4/17, revealed a few AVMs that presumed not contributing to melena or anemia. Colonoscopy recommended, but patient reportedly declined.    He was continued on Protonix.  At discharge hemoglobin noted to be 8.1 g/dL. Recommended follow-up with Dr. Henrene Pastor of gastroenterology in 2 to 3 weeks.  Discussed possible colonoscopy in the  future.  2.  Acute kidney injury superimposed on chronic kidney disease stage V: On admission patient's creatinine was elevated to 5.74 with BUN 99, but baseline previously have been around 4.  Symptoms thought to be prerenal with blood loss.  Nephrotoxic agents were avoided.  Patient is already in the process of being prepared for dialysis.  Advised him to keep follow-up appointments with nephrology.  3.  Elevated troponin, Coronary artery disease.  Patient with CABG in 2004 and last EF 40-45% with pseudonormalization and diffuse hypokinesis on 02/07/2019.  Troponins noted to be elevated 0.48-> 0.73->0.67-> 0.69.  Questioned demand in setting of ckd with known coronary artery disease.  Denies any complaints of chest pain. Also recent high risk stress test in 11/2018 with plans for cardiac catheterization next month. No chest pain. Ekg without acute changes. Cardiology consulted for clearance and cleared patient to have EGD.[name]  Patient was continued on Imdur, Coreg, high-dose statin   4.  Type 2 diabetes mellitus, insulin-dependent:Mildly uncontrolled.  Hemoglobin A1c A1c 6.8 08/2018 and 7.3 on 02/08/2019.  Patient recommended to continue home regimen at discharge with outpatient follow-up for optimization  5.  Hypertension: Fairly control. Home meds included lasix, Coreg, and Imdur.  6.  Hyperlipidemia: Last lipid panel from 11/29/2018 revealed total cholesterol 183, HDL 33, LDL 114, triglycerides 240.  Continued atorvastatin.  7.  Chronic combined systolic and diastolic CHF: Patient appears to be euvolemic.  Echocardiogram as seen above.  Patient not on ACE/ARB secondary to renal disease.  Continued beta-blocker  8.  HIV: Continued home medications. CD4 count 1220 and HIV RNA less than 20 in August 2019.  9.  Gout: No acute signs of a gout flare.  Continued current home medications.  10.  Diabetic foot ulcer: Patient has a right hallux ulceration.  Continue outpatient management with Dr.  Amalia Hailey of podiatry  Follow UP  Follow-up Information    [name], NP.   Specialty:  Geriatric Medicine Why:  telphone visit Mon 02/18/2019 @ 2:15 pm but it could be between 8 am-5 pm. Contact information: [address] [place] [address] [phone]        Irene Shipper, MD Follow up.   Specialty:  Gastroenterology Why:  Call and make appointment for 2-3 weeks Contact information: [address] [place] [address] [phone]            Consults obtained:  Gastroenterology- [facility]  Cardiology-Dr. Sherren Mocha  Discharge Condition: Stable  Diet and Activity recommendation: See Discharge Instructions below   Discharge Instructions    Discharge instructions   Complete by:  As directed    Follow with Primary [name], NP in 1-2 weeks.  During this hospitalization you were found on EGD to have few arteriovenous malformations (AVM) which were concerning for a possible cause of bleeding that were treated.  More information is on discharge paperwork regarding arteriovenous malformations of the gastrointestinal tract.  The only new medication that was started during this hospitalization was Protonix.  Recommending that you call and have a follow-up of appointment made with Dr. Henrene Pastor of gastroenterology in 2 to 3 weeks.  At that appointment need of colonoscopy can be discussed.  Please keep appointments all future appointments including those with nephrology.  If you develop bleeding or dark stools please seek medical attention.  CBC and BMP-  checked  by Primary in 1 week ( we routinely change or add medications that can affect your baseline labs and fluid status, therefore we recommend that you get the mentioned basic workup next visit with your PCP, your PCP may decide not to get them or add new tests based on their clinical decision)  Activity: As tolerated   Disposition: Home   Diet: Heart Healthy/carb modified     For Heart  failure patients - Check your Weight same time everyday, if you gain over 2  pounds, or you develop in leg swelling, experience more shortness of breath or chest pain, call your Primary doctor immediately. Follow Cardiac Low Salt Diet and 1.5 lit/day fluid restriction.  Special Instructions: If you have smoked or chewed Tobacco  in the last 2 yrs please stop smoking, stop any regular Alcohol  and or any Recreational drug use.  On your next visit with your primary care physician please Get Medicines reviewed and adjusted.  Please request your [name], NP to go over all Hospital Tests and Procedure/Radiological results at the follow up, please get all Hospital records sent to your Prim MD by signing hospital release before you go home.  If you experience worsening of your admission symptoms, develop shortness of breath, life threatening emergency, suicidal or homicidal thoughts you must seek medical attention immediately by calling 911 or calling your MD immediately  if symptoms less severe.  You Must read complete instructions/literature along with all the possible adverse reactions/side effects for all the Medicines you take and that have been prescribed to you. Take any new Medicines after you have completely understood and accpet all the possible adverse reactions/side effects.   Do not drive, operate heavy machinery, perform activities at heights, swimming or participation in water activities or provide baby sitting services if your were admitted for syncope or siezures until you have seen by Primary MD or a Neurologist and advised to do so again.  Do not drive when taking Pain medications.  Do not take more than prescribed Pain, Sleep and Anxiety Medications  Wear Seat belts while driving.   Please note  You were cared for by a hospitalist during your hospital stay. If you have any questions about your discharge medications or the care you received while you were in the hospital  after you are discharged, you can call the unit and asked to speak with the hospitalist on call if the hospitalist that took care of you is not available. Once you are discharged, your primary care physician will handle any further medical issues. Please note that NO REFILLS for any discharge medications will be authorized once you are discharged, as it is imperative that you return to your primary care physician (or establish a relationship with a primary care physician if you do not have one) for your aftercare needs so that they can reassess your need for medications and monitor your lab values.        Discharge Medications     Allergies as of 02/09/2019      Reactions   Augmentin [amoxicillin-pot Clavulanate] Diarrhea   Severe diarrhea    Amphetamines Other (See Comments)   Unknown reaction type      Medication List    TAKE these medications   acetaminophen 500 MG tablet Commonly known as:  TYLENOL Take 500 mg by mouth every 6 (six) hours as needed for moderate pain.   allopurinol 100 MG tablet Commonly known as:  ZYLOPRIM Take 1 tablet (100 mg total) by mouth daily.   aspirin EC 81 MG tablet Take 81 mg by mouth daily.   atorvastatin 80 MG tablet Commonly known as:  LIPITOR Take 1 tablet (80 mg total) by mouth daily.   B-D UF III MINI PEN NEEDLES 31G X 5 MM Misc Generic drug:  Insulin Pen Needle use four times a day   bictegravir-emtricitabine-tenofovir AF 50-200-25 MG Tabs tablet Commonly known as:  Biktarvy Take 1 tablet by mouth daily.   cadexomer iodine 0.9 % gel Commonly known  as:  IODOSORB Apply 1 application topically daily as needed for wound care.   carvedilol 25 MG tablet Commonly known as:  COREG TAKE 1 TABLET BY MOUTH TWICE A DAY WITH MEALS   colchicine 0.6 MG tablet Take one tablet by mouth as needed for gout   Fenofibric Acid 135 MG Cpdr TAKE 1 CAPSULE BY MOUTH DAILY   furosemide 80 MG tablet Commonly known as:  LASIX Take 1.5 tablets (120 mg  total) by mouth 2 (two) times daily. What changed:  how much to take   gabapentin 300 MG capsule Commonly known as:  NEURONTIN TAKE 1 CAPSULE BY MOUTH THREE TIMES DAILY   gentamicin cream 0.1 % Commonly known as:  GARAMYCIN Apply 1 application topically daily. Foot   GLUCOSAMINE CHONDR 1500 COMPLX PO Take 2 tablets by mouth daily.   glucose blood test strip Use to test blood sugar three times daily. E11.40   hydrALAZINE 25 MG tablet Commonly known as:  APRESOLINE Take 1 tablet (25 mg total) by mouth 3 (three) times daily.   HYDROcodone-acetaminophen 5-325 MG tablet Commonly known as:  Norco Take 1 tablet by mouth every 6 (six) hours as needed for moderate pain.   insulin lispro 100 UNIT/ML KiwkPen Commonly known as:  HumaLOG KwikPen Inject 18 units subcutaneously three times daily before meals   isosorbide mononitrate 30 MG 24 hr tablet Commonly known as:  IMDUR Take 1 tablet (30 mg total) by mouth daily. What changed:  See the new instructions.   Lantus SoloStar 100 UNIT/ML Solostar Pen Generic drug:  Insulin Glargine Inject 74 units subcutaneously every evening What changed:    how much to take  how to take this  when to take this  additional instructions   latanoprost 0.005 % ophthalmic solution Commonly known as:  XALATAN Place 1 drop into both eyes at bedtime. Reported on 04/04/2016   nitroGLYCERIN 0.3 MG SL tablet Commonly known as:  NITROSTAT Place 1 tablet (0.3 mg total) under the tongue as needed for chest pain (not to exceed 3 tablets in one day.).   ONE TOUCH ULTRA MINI w/Device Kit 1 each by Does not apply route 2 (two) times daily. Dx: L46.50   OneTouch Delica Plus [id number] Misc 1 each by Does not apply route 3 (three) times daily. Use to test blood sugar three times daily. Dx: E11.40   pantoprazole 40 MG tablet Commonly known as:  PROTONIX Take 1 tablet (40 mg total) by mouth daily.   sodium bicarbonate 650 MG tablet Take 650 mg by mouth  2 (two) times daily.   umeclidinium-vilanterol 62.5-25 MCG/INH Aepb Commonly known as:  Anoro Ellipta INHALE 1 PUFF BY MOUTH EVERY DAY   Vitamin D3 50 MCG (2000 UT) Tabs Take 2,000 Units by mouth daily.       Major procedures and Radiology Reports - PLEASE review detailed and final reports for all details, in brief -   EGD 02/08/2019 performed by Dr. Ardis Hughs: Findings There were a few small AVMs in the stomach and duodenal bulb (4-5 total).  Used APC to destroy of the AVMs and none bled during treatment. Mild duodenitis. It was doubtful that the AVMs noted above were responsible for his melena and acute on chronic anemia, and it was recommended that he have a colonoscopy the following day, but however he adamantly declined.   Echocardiogram 02/07/2019: Impression  1. The left ventricle has mild-moderately reduced systolic function, with an ejection fraction of 40-45%. The cavity size was normal. Mild basal  septal hypertrophy. Left ventricular diastolic Doppler parameters are consistent with pseudonormalization.  Elevated left ventricular end-diastolic pressure Left ventricular diffuse hypokinesis.  2. The right ventricle has normal systolic function. The cavity was normal. There is no increase in right ventricular wall thickness.  3. Left atrial size was severely dilated.  4. There was trivial MR, TR and PR.  Dg Chest 2 View  Result Date: 02/06/2019 CLINICAL DATA:  Low hemoglobin today.  Chest pain. EXAM: CHEST - 2 VIEW COMPARISON:  02/28/2018 FINDINGS: Sternotomy wires unchanged. Lungs are adequately inflated without focal airspace consolidation or effusion. Moderate stable cardiomegaly. Remainder of the exam is unchanged. IMPRESSION: No acute findings. Electronically Signed   By: Marin Olp M.D.   On: 02/06/2019 11:32    Micro Results     No results found for this or any previous visit (from the past 240 hour(s)).     Today   Subjective    Aviyon Hocevar reports that he  had a bowel movement that was nonbloody sometime yesterday.  He is ready to be discharged home.   Objective   Blood pressure (!) 155/82, pulse 65, temperature 98.2 F (36.8 C), temperature source Oral, resp. rate 18, height _0  (1.88 m), weight 89.4 kg, SpO2 100 %.   Intake/Output Summary (Last 24 hours) at 02/09/2019 0851 Last data filed at 02/09/2019 0100 Gross per 24 hour  Intake 1080 ml  Output 1200 ml  Net -120 ml    Exam  Constitutional: Elderly male NAD, calm, comfortable Eyes: PERRL, lids and conjunctivae normal ENMT: Mucous membranes are moist. Posterior pharynx clear of any exudate or lesions.  Neck: normal, supple, no masses, no thyromegaly Respiratory: clear to auscultation bilaterally, no wheezing, no crackles. Normal respiratory effort. No accessory muscle use.  Cardiovascular: Regular rate and rhythm, no murmurs / rubs / gallops. No extremity edema. 2+ pedal pulses. No carotid bruits.  Left forearm fistula Abdomen: no tenderness, no masses palpated. No hepatosplenomegaly. Bowel sounds positive.  Musculoskeletal: no clubbing / cyanosis. No joint deformity upper and lower extremities. Good ROM, no contractures. Normal muscle tone.  Skin:Ulcer of the right hallux Neurologic: CN 2-12 grossly intact. Sensation intact, DTR normal. Strength 5/5 in all 4.  Psychiatric: Normal judgment and insight. Alert and oriented x 3. Normal mood.    Data Review   CBC w Diff:  Lab Results  Component Value Date   WBC 5.9 02/09/2019   HGB 8.1 (L) 02/09/2019   HGB 14.2 08/07/2015   HCT 25.8 (L) 02/09/2019   HCT 41.9 08/07/2015   PLT 211 02/09/2019   PLT 157 08/07/2015   LYMPHOPCT 38 02/06/2019   MONOPCT 10 02/06/2019   EOSPCT 3 02/06/2019   BASOPCT 1 02/06/2019    CMP:  Lab Results  Component Value Date   NA 139 02/09/2019   NA 134 01/25/2018   K 4.7 02/09/2019   CL 108 02/09/2019   CO2 20 (L) 02/09/2019   BUN 91 (H) 02/09/2019   BUN 72 (H) 01/25/2018   CREATININE 5.08  (H) 02/09/2019   CREATININE 5.14 (H) 04/25/2018   PROT 7.6 02/07/2019   PROT 7.8 08/07/2015   ALBUMIN 3.4 (L) 02/07/2019   ALBUMIN 3.8 08/07/2015   BILITOT 0.9 02/07/2019   BILITOT 0.5 08/07/2015   ALKPHOS 40 02/07/2019   AST 26 02/07/2019   ALT 21 02/07/2019  .   Total Time in preparing paper work, data evaluation and todays exam - 35 minutes  Norval Morton M.D on 02/09/2019 at  8:51 AM  [facility]   Office  [phone]

## 2019-02-09 NOTE — Progress Notes (Signed)
Patient discharging today from unit to home. Family to provided transportation. All discharge information reviewed with patient. All personal belongings with patient.  Patient demonstrates no s/sx of distress or c/o at time of discharge.  Patient reminded to keep follow up appts as scheduled in an effert to avoid future hospital admits.

## 2019-02-10 ENCOUNTER — Encounter (HOSPITAL_COMMUNITY): Payer: Self-pay | Admitting: Gastroenterology

## 2019-02-11 ENCOUNTER — Telehealth: Payer: Self-pay | Admitting: *Deleted

## 2019-02-11 DIAGNOSIS — L97509 Non-pressure chronic ulcer of other part of unspecified foot with unspecified severity: Secondary | ICD-10-CM

## 2019-02-11 DIAGNOSIS — E11621 Type 2 diabetes mellitus with foot ulcer: Secondary | ICD-10-CM | POA: Diagnosis present

## 2019-02-11 NOTE — Telephone Encounter (Signed)
Transition Care Management Follow-up Telephone Call  Date of discharge and from where: 02/09/2019 [place]  How have you been since you were released from the hospital? Stated he is better and swelling has gone down  Any questions or concerns? No   Items Reviewed:  Did the pt receive and understand the discharge instructions provided? Yes   Medications obtained and verified? Yes   Any new allergies since your discharge? No   Dietary orders reviewed? Yes  Do you have support at home? No  Patient stated that he lives alone  Other (ie: DME, Home Health, etc) No Home health was ordered.   Functional Questionnaire: (I = Independent and D = Dependent) ADL's: I  Bathing/Dressing- I   Meal Prep- I  Eating- I  Maintaining continence- I  Transferring/Ambulation- I  Managing Meds- I   Follow up appointments reviewed:    PCP Hospital f/u appt confirmed? Yes  Scheduled to see [name] on 02/18/19.  [facility] f/u appt confirmed? Yes  Scheduled to see Cardiologist   Are transportation arrangements needed? No   If their condition worsens, is the pt aware to call  their PCP or go to the ED? Yes  Was the patient provided with contact information for the PCP's office or ED? Yes  Was the pt encouraged to call back with questions or concerns? Yes

## 2019-02-11 NOTE — Telephone Encounter (Signed)
I have made the 1st attempt to contact the patient or family member in charge, in order to follow up from recently being discharged from the hospital. I left a message on voicemail but I will make another attempt at a different time.  

## 2019-02-12 ENCOUNTER — Ambulatory Visit (INDEPENDENT_AMBULATORY_CARE_PROVIDER_SITE_OTHER): Payer: Medicare Other | Admitting: Nurse Practitioner

## 2019-02-12 ENCOUNTER — Encounter: Payer: Self-pay | Admitting: Nurse Practitioner

## 2019-02-12 ENCOUNTER — Other Ambulatory Visit: Payer: Self-pay

## 2019-02-12 DIAGNOSIS — D62 Acute posthemorrhagic anemia: Secondary | ICD-10-CM | POA: Diagnosis not present

## 2019-02-12 DIAGNOSIS — I5042 Chronic combined systolic (congestive) and diastolic (congestive) heart failure: Secondary | ICD-10-CM

## 2019-02-12 DIAGNOSIS — N184 Chronic kidney disease, stage 4 (severe): Secondary | ICD-10-CM

## 2019-02-12 DIAGNOSIS — E1122 Type 2 diabetes mellitus with diabetic chronic kidney disease: Secondary | ICD-10-CM

## 2019-02-12 DIAGNOSIS — Z794 Long term (current) use of insulin: Secondary | ICD-10-CM

## 2019-02-12 DIAGNOSIS — E1140 Type 2 diabetes mellitus with diabetic neuropathy, unspecified: Secondary | ICD-10-CM | POA: Diagnosis not present

## 2019-02-12 DIAGNOSIS — E11621 Type 2 diabetes mellitus with foot ulcer: Secondary | ICD-10-CM

## 2019-02-12 DIAGNOSIS — L97419 Non-pressure chronic ulcer of right heel and midfoot with unspecified severity: Secondary | ICD-10-CM

## 2019-02-12 DIAGNOSIS — I10 Essential (primary) hypertension: Secondary | ICD-10-CM

## 2019-02-12 MED ORDER — PANTOPRAZOLE SODIUM 40 MG PO TBEC: 40.0000 mg | DELAYED_RELEASE_TABLET | Freq: Every day | ORAL | 2 refills | Status: DC

## 2019-02-12 NOTE — Patient Instructions (Addendum)
Continue protonix 40 mg daily  GI referral has been placed and you will need to follow up with them   To notify for increase in shortness of breath, fatigue, chest pains, palpitations, dark stools, etc  To continue to check blood sugars, minimize sugar intake, diabetic diet.

## 2019-02-12 NOTE — Progress Notes (Signed)
This service is provided via telemedicine  No vital signs collected/recorded due to the encounter was a telemedicine visit.   Location of patient (ex: home, work): Home  Patient consents to a telephone visit:  Yes  Location of the provider (ex: office, home):  [facility], Office   Name of any referring provider:  N/A  Names of all persons participating in the telemedicine service and their role in the encounter: S.Chrae B/CMA, Sherrie Mustache, NP, and Patient   Time spent on call: 13 min with medical assistant   Virtual Visit via Telephone Note  I connected with [name] on 02/12/19 at  2:15 PM EDT by telephone and verified that I am speaking with the correct person using two identifiers.   I discussed the limitations, risks, security and privacy concerns of performing an evaluation and management service by telephone and the availability of in person appointments. I also discussed with the patient that there may be a patient responsible charge related to this service. The patient expressed understanding and agreed to proceed.      Careteam: Patient Care Team: [name], NP as PCP - General (Geriatric Medicine) [name], MD as PCP - Infectious Diseases (Infectious Diseases) [name], MD as Consulting Physician (Ophthalmology) [name], MD as Consulting Physician (Cardiology) [name], MD as Consulting Physician (Infectious Diseases) Gardiner Barefoot, DPM as Consulting Physician (Podiatry) Madelon Lips, MD as Consulting Physician (Nephrology)  Advanced Directive information Does Patient Have a Medical Advance Directive?: Yes, Type of Advance Directive: Living will, Does patient want to make changes to medical advance directive?: No - Patient declined  Allergies  Allergen Reactions  . Augmentin [Amoxicillin-Pot Clavulanate] Diarrhea    Severe diarrhea   . Amphetamines Other (See Comments)    Unknown reaction type    Chief Complaint  Patient presents with  . [facility] follow-up 02/06/2019-02/08/2019 for low hemoglobin.   . Advanced Directive    Discuss difference between a durable POA and healthcare POA. We have durable POA on file.      HPI: Patient is a 68 y.o. male for hospital follow up.  Pt with a medical history significant ofchronic kidney disease stage V not currently on dialysis (AV fistula placed), diabetes mellitus, coronary artery disease, HIV, hypertension, hyperlipidemia who went to the hospital due to Hgb 6discovered by nephrologist.  Hemoglobin was found to be 6.4 S/p 1 unit PRBC's Hg 8.34fom 6.4 on admission. Pt reports symptoms of back pain and shortness of breath prior to hospitalization, improved now.  Evaluated by GI team who recommend EGD. EGD on 4/17, revealed a few AVMs that presumed not contributing to melena or anemia. Colonoscopy recommended he plans to follow up with GI to have this done.   he was started on protonix 40 mg daily.  Reports he is having to take lyft to his doctor appts and unsure when he could make appt. Not having anymore dark tarry stools.   -On admission patient's creatinine was elevated to 5.74 with BUN 99, but baseline previously have been around 4.  Symptoms thought to be prerenal with blood loss.  Nephrotoxic agents were avoided.  Patient is already in the process of being prepared for dialysis. He plans to follow up with them.   Elevated troponin, Coronary artery disease.  Patient with CABG in 2004 and last EF 40-45% with pseudonormalization and diffuse hypokinesis on 02/07/2019.  Troponins noted to be elevated 0.48-> 0.73->0.67-> 0.69.  Questioned demand in setting of ckd with known coronary artery disease.  Denies any complaints of chest pain. Also recent high risk stress test in 11/2018 with plans for cardiac catheterization next month. No chest pain. Ekg without acute changes. Cardiology consultedfor clearance and cleared patient  to have EGD.[name]  Patient was continued on Imdur, Coreg, high-dose statin. No chest pains.    Type 2 diabetes mellitus, insulin-dependent:  Hemoglobin A1c A1c 6.8 11/2019and 7.3 on 02/08/2019. Pt continues on lantus 74 mg and Humalog 18 units with meals. Some high blood sugars, eating more yogurt and eating high protein liquid- plans to check to see if this has a lot of sugar.   Hypertension:  control during hospitalization, does not check blood pressure at home. Home meds included lasix, Coreg, and Imdur.   Chronic combined systolic and diastolic CHF: Patient appears to be euvolemic.  Echocardiogram as seen above.  Patient not on ACE/ARB secondary to renal disease.  Continued beta-blocker, no increase in edema or  shortness of breath  Diabetic right hallux foot ulcer- ongoing follow up with Dr Amalia Hailey, Podiatry, recently had evaluated and follow up in 2 week.    Review of Systems:  Review of Systems  Constitutional: Negative for chills, fever and malaise/fatigue.  HENT: Negative for congestion.   Respiratory: Negative for cough and shortness of breath.   Cardiovascular: Negative for chest pain, palpitations and leg swelling.  Gastrointestinal: Negative for abdominal pain, constipation and diarrhea.  Genitourinary: Negative for dysuria and urgency.  Musculoskeletal: Negative for myalgias.  Skin: Negative.        Ulcer to toe  Neurological: Positive for tingling (to feet bilaterally) and sensory change (to feet bilaterall). Negative for dizziness and headaches.    Past Medical History:  Diagnosis Date  . Arthritis    "all over; mostly knees and back" (02/28/2018)  . CHF (congestive heart failure) (HCC)    not on any meds  . Chronic lower back pain    stenosis  . CKD (chronic kidney disease), stage V ([place])    [name] 02/28/2018  . Coronary atherosclerosis of native coronary artery 2005   s/p surgery  . Drug abuse ([place])    hx; tested for cocaine as recently as 2/08. says he is not  using drugs now - avoided defib. for this reason   . GERD (gastroesophageal reflux disease)    takes OTC meds as needed  . GI bleeding 02/06/2019  . Glaucoma    uses eye drops daily  . Hepatitis B 1968   "tx'd w/isolation; caught it from toilet stools in gym"  . History of colon polyps    benign  . History of gout    takes Allopurinol daily as well as Colchicine-if needed (02/28/2018)  . History of kidney stones   . HTN (hypertension)    takes Coreg,Imdur.and Apresoline daily  . Human immunodeficiency virus (HIV) disease ([place]) dx'd 1995   takes Genvoya daily  . Hyperlipidemia    takes Atorvastatin daily  . Ischemic cardiomyopathy   . Muscle spasm    takes Zanaflex as needed  . Myocardial infarction ([place]) ~ 2004/2005  . Nocturia   . Peripheral neuropathy    takes gabapentin daily  . Pneumonia    "at least twice" (02/28/2018)  . Shortness of breath dyspnea    rarely but if notices it then with exertion  . Syphilis, unspecified   . Type II diabetes mellitus ([place]) 2004   Lantus daily.Average fasting blood sugar 125-199  . Wears  glasses   . Wears partial dentures    Past Surgical History:  Procedure Laterality Date  . AV FISTULA PLACEMENT Left 08/02/2018   Procedure: ARTERIOVENOUS (AV) FISTULA CREATION  left arm radiocephlic;  Surgeon: Marty Heck, MD;  Location: [place];  Service: Vascular;  Laterality: Left;  . BACK SURGERY    . CARDIAC CATHETERIZATION  10/2002; 12/19/2004   [name] 03/08/2011  . COLONOSCOPY  2013   Dr.John Henrene Pastor   . CORONARY ARTERY BYPASS GRAFT  02/24/2003   CABG X2/notes 03/08/2011  . ESOPHAGOGASTRODUODENOSCOPY (EGD) WITH PROPOFOL N/A 02/08/2019   Procedure: ESOPHAGOGASTRODUODENOSCOPY (EGD) WITH PROPOFOL;  Surgeon: Milus Banister, MD;  Location: [place];  Service: Gastroenterology;  Laterality: N/A;  . FRACTURE SURGERY    . HOT HEMOSTASIS N/A 02/08/2019   Procedure: HOT HEMOSTASIS (ARGON PLASMA COAGULATION/BICAP);  Surgeon: Milus Banister, MD;   Location[facility]COPY;  Service: Gastroenterology;  Laterality: N/A;  . INTERTROCHANTERIC HIP FRACTURE SURGERY Left 11/2006   [name] 03/08/2011  . LAPAROSCOPIC CHOLECYSTECTOMY  05/2006  . LIGATION OF COMPETING BRANCHES OF ARTERIOVENOUS FISTULA Left 11/05/2018   Procedure: LIGATION OF COMPETING BRANCHES OF ARTERIOVENOUS FISTULA  LEFT  ARM;  Surgeon: Marty Heck, MD;  Location: [place];  Service: Vascular;  Laterality: Left;  . LUMBAR LAMINECTOMY/DECOMPRESSION MICRODISCECTOMY N/A 02/29/2016   Procedure: Left L4-5 Lateral Recess Decompression, Removal Extradural Intraspinal Facet Cyst;  Surgeon: Marybelle Killings, MD;  Location: [place];  Service: Orthopedics;  Laterality: N/A;  . MULTIPLE TOOTH EXTRACTIONS    . ORIF MANDIBULAR FRACTURE Left 08/13/2004   ORIF of left body fracture mandible with KLS Martin 2.3-mm six hole/notes 03/08/2011   Social History:   reports that he has been smoking cigarettes. He has a 43.00 pack-year smoking history. He has never used smokeless tobacco. He reports current alcohol use of about 12.0 standard drinks of alcohol per week. He reports that he does not use drugs.  Family History  Problem Relation Age of Onset  . Heart failure Father   . Hypertension Father   . Diabetes Brother   . Heart attack Brother   . Alzheimer's disease Mother   . Stroke Sister   . Diabetes Sister   . Alzheimer's disease Sister   . Hypertension Brother   . Diabetes Brother   . Drug abuse Brother   . Colon cancer Neg Hx     Medications: Patient's Medications  New Prescriptions   No medications on file  Previous Medications   ACETAMINOPHEN (TYLENOL) 500 MG TABLET    Take 500 mg by mouth every 6 (six) hours as needed for moderate pain.   ALLOPURINOL (ZYLOPRIM) 100 MG TABLET    Take 1 tablet (100 mg total) by mouth daily.   ASPIRIN EC 81 MG TABLET    Take 81 mg by mouth daily.   ATORVASTATIN (LIPITOR) 80 MG TABLET    Take 1 tablet (80 mg total) by mouth daily.   B-D UF III MINI PEN  NEEDLES 31G X 5 MM MISC    use four times a day   BICTEGRAVIR-EMTRICITABINE-TENOFOVIR AF (BIKTARVY) 50-200-25 MG TABS TABLET    Take 1 tablet by mouth daily.   BLOOD GLUCOSE MONITORING SUPPL (ONE TOUCH ULTRA MINI) W/DEVICE KIT    1 each by Does not apply route 2 (two) times daily. Dx: E11.40   CADEXOMER IODINE (IODOSORB) 0.9 % GEL    Apply 1 application topically daily as needed for wound care.   CARVEDILOL (COREG) 25 MG TABLET  TAKE 1 TABLET BY MOUTH TWICE A DAY WITH MEALS   CHOLECALCIFEROL (VITAMIN D3) 50 MCG (2000 UT) TABS    Take 2,000 Units by mouth daily.   CHOLINE FENOFIBRATE (FENOFIBRIC ACID) 135 MG CPDR    TAKE 1 CAPSULE BY MOUTH DAILY   COLCHICINE 0.6 MG TABLET    Take one tablet by mouth as needed for gout   FUROSEMIDE (LASIX) 80 MG TABLET    Take 1.5 tablets (120 mg total) by mouth 2 (two) times daily.   GABAPENTIN (NEURONTIN) 300 MG CAPSULE    TAKE 1 CAPSULE BY MOUTH THREE TIMES DAILY   GLUCOSAMINE-CHONDROIT-VIT C-MN (GLUCOSAMINE CHONDR 1500 COMPLX PO)    Take 2 tablets by mouth daily.    GLUCOSE BLOOD TEST STRIP    Use to test blood sugar three times daily. E11.40   HYDRALAZINE (APRESOLINE) 25 MG TABLET    Take 1 tablet (25 mg total) by mouth 3 (three) times daily.   INSULIN GLARGINE (LANTUS SOLOSTAR) 100 UNIT/ML SOLOSTAR PEN    Inject 74 Units into the skin every morning.   INSULIN LISPRO (HUMALOG KWIKPEN) 100 UNIT/ML KIWKPEN    Inject 18 units subcutaneously three times daily before meals   ISOSORBIDE MONONITRATE (IMDUR) 30 MG 24 HR TABLET    Take 1 tablet (30 mg total) by mouth daily.   LANCETS (ONETOUCH DELICA PLUS [id number]) MISC    1 each by Does not apply route 3 (three) times daily. Use to test blood sugar three times daily. Dx: E11.40   LATANOPROST (XALATAN) 0.005 % OPHTHALMIC SOLUTION    Place 1 drop into both eyes at bedtime. Reported on 04/04/2016   NITROGLYCERIN (NITROSTAT) 0.3 MG SL TABLET    Place 1 tablet (0.3 mg total) under the tongue as needed for chest pain (not  to exceed 3 tablets in one day.).   PANTOPRAZOLE (PROTONIX) 40 MG TABLET    Take 1 tablet (40 mg total) by mouth daily.   SODIUM BICARBONATE 650 MG TABLET    Take 650 mg by mouth 2 (two) times daily.    UMECLIDINIUM-VILANTEROL (ANORO ELLIPTA) 62.5-25 MCG/INH AEPB    INHALE 1 PUFF BY MOUTH EVERY DAY  Modified Medications   No medications on file  Discontinued Medications   GENTAMICIN CREAM (GARAMYCIN) 0.1 %    Apply 1 application topically daily. Foot   HYDROCODONE-ACETAMINOPHEN (NORCO) 5-325 MG TABLET    Take 1 tablet by mouth every 6 (six) hours as needed for moderate pain.   LANTUS SOLOSTAR 100 UNIT/ML SOLOSTAR PEN    Inject 74 units subcutaneously every evening     Physical Exam: unable due to tele-visit    Labs reviewed: Basic Metabolic Panel: Recent Labs    02/28/18 1640 03/01/18 0300  02/06/19 1045 02/07/19 0642 02/07/19 1956 02/09/19 0518  NA 137 137   < > 138 140  --  139  K 6.8* 5.4*   < > 4.5 4.5 4.4 4.7  CL 106 106   < > 108 107  --  108  CO2 22 23   < > 17* 19*  --  20*  GLUCOSE 292* 234*   < > 263* 147*  --  163*  BUN 60* 66*   < > 99* 98*  --  91*  CREATININE 4.70* 4.22*   < > 5.74* 5.63*  --  5.08*  CALCIUM 9.4 9.0   < > 8.8* 9.1  --  8.9  PHOS 3.5 3.7  --   --   --   --   --    < > =  values in this interval not displayed.   Liver Function Tests: Recent Labs    02/28/18 0546  03/01/18 0300 11/29/18 0933 02/06/19 1045 02/07/19 0642  AST 37  --   --  '30 25 26  ' ALT 25  --   --  '20 20 21  ' ALKPHOS 36*  --   --   --  36* 40  BILITOT 1.0  --   --  0.4 0.5 0.9  PROT 7.4  --   --  7.9 7.1 7.6  ALBUMIN 3.4*   < > 3.2*  --  3.3* 3.4*   < > = values in this interval not displayed.   Recent Labs    02/06/19 1045  LIPASE 110*   No results for input(s): AMMONIA in the last 8760 hours. CBC: Recent Labs    02/27/18 1600  03/13/18 1034  02/06/19 1045  02/07/19 0642 02/08/19 0509 02/09/19 0518  WBC 5.7   < > 6.2  --  5.8  --  6.8 7.0 5.9  NEUTROABS  2,491  --  3,150  --  2.7  --   --   --   --   HGB 12.8*   < > 12.8*   < > 6.4*   < > 8.2* 8.0* 8.1*  HCT 36.8*   < > 36.9*   < > 20.3*   < > 26.0* 26.1* 25.8*  MCV 95.8   < > 95.1  --  96.2  --  91.9 94.9 93.5  PLT 186   < > 222  --  199  --  201 195 211   < > = values in this interval not displayed.   Lipid Panel: Recent Labs    05/30/18 1007 11/29/18 0933  CHOL 178 183  HDL 29* 33*  LDLCALC 112* 114*  TRIG 245* 240*  CHOLHDL 6.1* 5.5*   TSH: No results for input(s): TSH in the last 8760 hours. A1C: Lab Results  Component Value Date   HGBA1C 7.3 (H) 02/08/2019     Assessment/Plan 1. CKD stage 4 due to type 2 diabetes mellitus ([place]) Ongoing, not requiring HD at this time but preparing to start. Following with nephrologist   2. Type 2 diabetes mellitus with diabetic neuropathy, with long-term current use of insulin (HCC) -blood sugars have been variable. Encouraged to evaluate diet and make sure supplements are not high in sugar. He plans to start reading these labels and with modify diet accordingly.   3. Acute blood loss anemia -no current symptoms  -EGD on 4/17, revealed a few AVMs that presumed not contributing to melena or anemia. Recommended colonoscopy however was not done during hospitalization - Ambulatory referral to Gastroenterology- he has seen Dr Henrene Pastor in the past for colonoscopy.  -to continue protonix daily To notify if worsening of symptoms -pt states he is not going to be able to come to office until 03/01/2019 to follow up hgb due to transportation and will call before hand if needed.  - CBC with Differential/Platelet; Future - pantoprazole (PROTONIX) 40 MG tablet; Take 1 tablet (40 mg total) by mouth daily.  Dispense: 30 tablet; Refill: 2  4. Chronic combined systolic and diastolic congestive heart failure (HCC) Stable, continues on coreg, lasix  5. Diabetic ulcer of right heel associated with type 2 diabetes mellitus, unspecified ulcer stage ([place])  Stable, ongoing follow up with podiatry, without worsening of ulcer at this time  6. Essential hypertension Controlled during hospitalization, has not check since he has been  home. Continues on current regimen  Next appt: 03/01/2019 [name]. Harle Battiest  [facility] [phone]    Follow Up Instructions:    I discussed the assessment and treatment plan with the patient. The patient was provided an opportunity to ask questions and all were answered. The patient agreed with the plan and demonstrated an understanding of the instructions.   The patient was advised to call back or seek an in-person evaluation if the symptoms worsen or if the condition fails to improve as anticipated.  I provided 18 minutes of non-face-to-face time during this encounter.  avs printed and mailed  [name], NP

## 2019-02-13 ENCOUNTER — Telehealth: Payer: Self-pay | Admitting: *Deleted

## 2019-02-13 NOTE — Telephone Encounter (Signed)
Patient notified and agreed.  

## 2019-02-13 NOTE — Telephone Encounter (Signed)
Yes he can take tylenol 325 mg 1-2 tablets every 6 hours as needed pain

## 2019-02-13 NOTE — Telephone Encounter (Signed)
Patient called and wanted to know if he can take Generic Tylenol 325mg  as needed for his back pain. Please Advise.    FYI: patient stated that he has a Televisit with Dr. Henrene Pastor in the morning.

## 2019-02-14 ENCOUNTER — Other Ambulatory Visit: Payer: Self-pay

## 2019-02-14 ENCOUNTER — Encounter: Payer: Self-pay | Admitting: Internal Medicine

## 2019-02-14 ENCOUNTER — Telehealth: Payer: Self-pay | Admitting: Internal Medicine

## 2019-02-14 ENCOUNTER — Ambulatory Visit (INDEPENDENT_AMBULATORY_CARE_PROVIDER_SITE_OTHER): Payer: Medicare Other | Admitting: Internal Medicine

## 2019-02-14 VITALS — Ht 74.0 in | Wt 196.0 lb

## 2019-02-14 DIAGNOSIS — K31819 Angiodysplasia of stomach and duodenum without bleeding: Secondary | ICD-10-CM | POA: Diagnosis not present

## 2019-02-14 DIAGNOSIS — D500 Iron deficiency anemia secondary to blood loss (chronic): Secondary | ICD-10-CM

## 2019-02-14 DIAGNOSIS — K921 Melena: Secondary | ICD-10-CM | POA: Diagnosis not present

## 2019-02-14 MED ORDER — FERROUS SULFATE 325 (65 FE) MG PO TABS: 325.0000 mg | ORAL_TABLET | Freq: Two times a day (BID) | ORAL | 11 refills | Status: DC

## 2019-02-14 NOTE — Telephone Encounter (Signed)
Pt needs to know the milligrams of the iron pills that Dr. Henrene Pastor recommended today. Pls call him.

## 2019-02-14 NOTE — Telephone Encounter (Signed)
Dr. Henrene Pastor please see note below and advise. Checked for OV note but you have not had time to finish it yet.

## 2019-02-14 NOTE — Telephone Encounter (Signed)
Iron sulfate 325 mg twice daily; #60; 11 refills.  Please prescribe 2 [facility] at [address]

## 2019-02-14 NOTE — Telephone Encounter (Signed)
Spoke with pt and he is aware, script sent to pharmacy. 

## 2019-02-16 ENCOUNTER — Encounter: Payer: Self-pay | Admitting: Internal Medicine

## 2019-02-16 ENCOUNTER — Telehealth: Payer: Self-pay | Admitting: Internal Medicine

## 2019-02-16 NOTE — Telephone Encounter (Signed)
Diarrhea x 4 today (tonight) No pain, no melena or blood seen and no fever  Recent ablation gastric AVM's Was started on ferrous sulfate 325 mg bid - started today - pc supper 4 loose stools  Advice given  Hold ferrous sulfate tomorrow If diarrhea stops retry it next day Call back prn  Note pantoprazole new Rx 4/21 also may need to be held if holding ferrous sulfate does not work  Could need C diff PCR if persists w/ diarrhea also

## 2019-02-17 ENCOUNTER — Encounter: Payer: Self-pay | Admitting: Internal Medicine

## 2019-02-17 NOTE — Progress Notes (Signed)
HISTORY OF PRESENT ILLNESS:  Jacob [name] is a 68 y.o. male with multiple medical problems including but not limited to congestive heart failure, end-stage renal disease, diabetes mellitus, and chronic anemia.  He was recently hospitalized with and possible melena.  He was found to have acute on chronic anemia.  Upper endoscopy performed February 08, 2019 with Dr. Ardis Hughs revealed multiple AVMs of the stomach and duodenum.  These were small and nonbleeding.  They were treated endoscopically.  States that colonoscopy was recommended though he declined at that time.  He was told to schedule this telemedicine follow-up.  The patient did undergo colonoscopy December 2013 and was found to have a diminutive colon polyp and moderate sigmoid diverticulosis.  No other abnormalities.  Patient's GI review of systems is unremarkable.  Patient tells me that he has been feeling well since hospital discharge.  He denies taking oral iron supplements.  He denies bleeding.  He is interested in colonoscopy.  He is not on Epogen.  REVIEW OF SYSTEMS:  All non-GI ROS negative unless otherwise stated in the HPI except for arthritis Past Medical History:  Diagnosis Date  . Acute respiratory failure ([place]) 03/01/2018  . Arthritis    "all over; mostly knees and back" (02/28/2018)  . CHF (congestive heart failure) (HCC)    not on any meds  . Chronic lower back pain    stenosis  . CKD (chronic kidney disease), stage V ([place])    [name] 02/28/2018  . Community acquired pneumonia 09/06/2013  . Coronary atherosclerosis of native coronary artery 2005   s/p surgery  . Drug abuse ([place])    hx; tested for cocaine as recently as 2/08. says he is not using drugs now - avoided defib. for this reason   . GERD (gastroesophageal reflux disease)    takes OTC meds as needed  . GI bleeding 02/06/2019  . Glaucoma    uses eye drops daily  . Hepatitis B 1968   "tx'd w/isolation; caught it from toilet stools in gym"  . History of colon polyps     benign  . History of gout    takes Allopurinol daily as well as Colchicine-if needed (02/28/2018)  . History of kidney stones   . HTN (hypertension)    takes Coreg,Imdur.and Apresoline daily  . Human immunodeficiency virus (HIV) disease ([place]) dx'd 1995   takes Genvoya daily  . Hyperlipidemia    takes Atorvastatin daily  . Ischemic cardiomyopathy   . Muscle spasm    takes Zanaflex as needed  . Myocardial infarction ([place]) ~ 2004/2005  . Nocturia   . Peripheral neuropathy    takes gabapentin daily  . Pneumonia    "at least twice" (02/28/2018)  . Shortness of breath dyspnea    rarely but if notices it then with exertion  . Syphilis, unspecified   . Type II diabetes mellitus ([place]) 2004   Lantus daily.Average fasting blood sugar 125-199  . Wears glasses   . Wears partial dentures     Past Surgical History:  Procedure Laterality Date  . AV FISTULA PLACEMENT Left 08/02/2018   Procedure: ARTERIOVENOUS (AV) FISTULA CREATION  left arm radiocephlic;  Surgeon: Marty Heck, MD;  Location: [facility];  Service: Vascular;  Laterality: Left;  . BACK SURGERY    . CARDIAC CATHETERIZATION  10/2002; 12/19/2004   [name] 03/08/2011  . COLONOSCOPY  2013   Dr.John Henrene Pastor   . CORONARY ARTERY BYPASS GRAFT  02/24/2003   CABG X2/notes 03/08/2011  . ESOPHAGOGASTRODUODENOSCOPY (  EGD) WITH PROPOFOL N/A 02/08/2019   Procedure: ESOPHAGOGASTRODUODENOSCOPY (EGD) WITH PROPOFOL;  Surgeon: Milus Banister, MD;  Location: [facility];  Service: Gastroenterology;  Laterality: N/A;  . FRACTURE SURGERY    . HOT HEMOSTASIS N/A 02/08/2019   Procedure: HOT HEMOSTASIS (ARGON PLASMA COAGULATION/BICAP);  Surgeon: Milus Banister, MD;  Location[facility] END[facility]COPY;  Service: Gastroenterology;  Laterality: N/A;  . INTERTROCHANTERIC HIP FRACTURE SURGERY Left 11/2006   [name] 03/08/2011  . LAPAROSCOPIC CHOLECYSTECTOMY  05/2006  . LIGATION OF COMPETING BRANCHES OF ARTERIOVENOUS FISTULA Left 11/05/2018   Procedure: LIGATION OF  COMPETING BRANCHES OF ARTERIOVENOUS FISTULA  LEFT  ARM;  Surgeon: Marty Heck, MD;  Location: [place];  Service: Vascular;  Laterality: Left;  . LUMBAR LAMINECTOMY/DECOMPRESSION MICRODISCECTOMY N/A 02/29/2016   Procedure: Left L4-5 Lateral Recess Decompression, Removal Extradural Intraspinal Facet Cyst;  Surgeon: Marybelle Killings, MD;  Location: [place] [facility];  Service: Orthopedics;  Laterality: N/A;  . MULTIPLE TOOTH EXTRACTIONS    . ORIF MANDIBULAR FRACTURE Left 08/13/2004   ORIF of left body fracture mandible with KLS Martin 2.3-mm six hole/notes 03/08/2011    Social History JEFF FRIEDEN  reports that he has been smoking cigarettes. He has a 43.00 pack-year smoking history. He has never used smokeless tobacco. He reports current alcohol use of about 12.0 standard drinks of alcohol per week. He reports that he does not use drugs.  family history includes Alzheimer's disease in his mother and sister; Diabetes in his brother, brother, and sister; Drug abuse in his brother; Heart attack in his brother; Heart failure in his father; Hypertension in his brother and father; Stroke in his sister.  Allergies  Allergen Reactions  . Augmentin [Amoxicillin-Pot Clavulanate] Diarrhea    Severe diarrhea   . Amphetamines Other (See Comments)    Unknown reaction type       PHYSICAL EXAMINATION: No physical examination with telemedicine visit    ASSESSMENT:  1.  Chronic anemia.  Multifactorial 2.  Acute on chronic anemia likely secondary to GI bleeding from gastrointestinal AVMs 3.  Multiple significant medical problems including congestive heart failure, advanced renal disease, diabetes mellitus 4.  Colonoscopy 2013 as described   PLAN:  1.  Patient may benefit from chronic iron therapy.  We have prescribed iron sulfate 325 mg twice daily 2.  He has been encouraged to discuss with his PCP formal hematology evaluation.  He may benefit from Epogen given his renal disease 3.  Routine office  follow-up with me in person in about 3 months, hopefully after the coronavirus pandemic concerns have eased.  We can discuss prospects of colonoscopy at that time.  He understands and agrees.  Patient is high risk for procedures and this would need to be scheduled at the hospital  This telemedicine visit was initiated by and consented for by the patient (Medicare during the coronavirus pandemic).  He was in his home and I was in my office during the encounter.  He understands her may be an associated professional charge.

## 2019-02-18 ENCOUNTER — Telehealth: Payer: Self-pay | Admitting: Cardiovascular Disease

## 2019-02-18 ENCOUNTER — Telehealth: Payer: Self-pay

## 2019-02-18 ENCOUNTER — Ambulatory Visit: Payer: Medicare Other | Admitting: Nurse Practitioner

## 2019-02-18 NOTE — Telephone Encounter (Signed)
Called patient back about his message. Patient complaining about SOB with activity that has been going on for about a month. Patient scheduled to see Dr. [name] on DOD day on 02/25/19. Patient stated he feels fine when he is resting, but gets SOB just walking to the mail box. Patient stated he does not have any swelling in his BLE, but has some swelling in his abdomen.  Patient has history of CHF and CKD stage 4. Patient on Lasix 120 mg daily. Patient stated he is suppose to start dialysis in the future, patient has fistula in left arm. Per Dr. [name] at last office visit recommend cardiac cath once the patient is initiated on hemodialysis. Will forward to DOD for further advisement, Dr. Acie Fredrickson, since Dr. [name] is not in the office at this time.

## 2019-02-18 NOTE — Telephone Encounter (Signed)
Called patient regarding medication.  Patient states he took one dose of Ferrous Sulfate on Sunday and had diarrhea so he did not take second dose.  He has taken Pantoprazole this morning but no Ferrous Sulfate.  I advised patient to hold off on Ferrous Sulfate today since he has already taken the Pantoprazole and call us back tomorrow to let us know if diarrhea has stopped.

## 2019-02-18 NOTE — Telephone Encounter (Signed)
New Message    Pt c/o Shortness Of Breath: STAT if SOB developed within the last 24 hours or pt is noticeably SOB on the phone  1. Are you currently SOB (can you hear that pt is SOB on the phone)? Only if he tries walking, Cannot hear on the phone   2. How long have you been experiencing SOB? About a month   3. Are you SOB when sitting or when up moving around?Moving around  4. Are you currently experiencing any other symptoms? No, he just has to stop and catch his breath

## 2019-02-18 NOTE — Telephone Encounter (Signed)
Pt asked to clarify instructions on taking/stopping ferrous sulfate.

## 2019-02-18 NOTE — Telephone Encounter (Signed)
Called patient back with Dr. Elmarie Shiley recommendations. Dr. Acie Fredrickson recommends patient to call his nephrologist and reduce his salt intake. He says this almost certainly a nephrology issue and patient will likely need to start dialysis after reviewing creatinine level. Patient would like to keep his appointment for Monday. Patient stated he has called the nephrologist and waiting for their office to call back. Will forward to Dr. [name], so he is aware.

## 2019-02-20 ENCOUNTER — Ambulatory Visit (INDEPENDENT_AMBULATORY_CARE_PROVIDER_SITE_OTHER): Payer: Medicare Other | Admitting: Nurse Practitioner

## 2019-02-20 ENCOUNTER — Telehealth: Payer: Self-pay | Admitting: Internal Medicine

## 2019-02-20 ENCOUNTER — Encounter: Payer: Self-pay | Admitting: Nurse Practitioner

## 2019-02-20 ENCOUNTER — Other Ambulatory Visit: Payer: Self-pay

## 2019-02-20 DIAGNOSIS — R0602 Shortness of breath: Secondary | ICD-10-CM | POA: Diagnosis not present

## 2019-02-20 DIAGNOSIS — K31819 Angiodysplasia of stomach and duodenum without bleeding: Secondary | ICD-10-CM | POA: Diagnosis not present

## 2019-02-20 NOTE — Telephone Encounter (Signed)
Patient reports that he had 2 bloody stools yesterday am.  He had several "runny BMs"  yesterday following with no frank blood or melena.  Two "runny BM this am, but no bleeding today.  He does not feel he can take the po iron supplements.  Please clarify if he should be on pantoprazole.  Dr. Henrene Pastor he wanted you to be aware.

## 2019-02-20 NOTE — Progress Notes (Signed)
This service is provided via telemedicine  No vital signs collected/recorded due to the encounter was a telemedicine visit.   Location of patient (ex: home, work):  Home  Patient consents to a telephone visit:  Yes  Location of the provider (ex: office, home):  [employer], office  Names of all persons participating in the telemedicine service and their role in the encounter:  Jacob [name] CMA, Jacob Mustache NP, and patient.   Time spent on call:  8 mins  Virtual Visit via Telephone Note  I connected with Jacob [name] on 02/20/19 at 10:00 AM EDT by telephone and verified that I am speaking with the correct person using two identifiers.   I discussed the limitations, risks, security and privacy concerns of performing an evaluation and management service by telephone and the availability of in person appointments. I also discussed with the patient that there may be a patient responsible charge related to this service. The patient expressed understanding and agreed to proceed.     Careteam: Patient Care Team: Jacob Chandler, NP as PCP - General (Geriatric Medicine) Jacob Bickers, MD as PCP - Infectious Diseases (Infectious Diseases) Jacob Drought, MD as Consulting Physician (Ophthalmology) Jacob Hector, MD as Consulting Physician (Cardiology) Jacob Bickers, MD as Consulting Physician (Infectious Diseases) Jacob [name], DPM as Consulting Physician (Podiatry) Jacob Lips, MD as Consulting Physician (Nephrology)  Advanced Directive information    Allergies  Allergen Reactions  . Augmentin [Amoxicillin-Pot Clavulanate] Diarrhea    Severe diarrhea   . Amphetamines Other (See Comments)    Unknown reaction type    Chief Complaint  Patient presents with  . Acute Visit    Pain in right side above waist. Kidney doctor this morning says they are trying to get him to [facility] stay because of SOB.      HPI: Patient is a 68 y.o. male due to  shortness of breath and pain.   He was started on iron Saturday morning and when he took second dose he  had diarrhea, he called the on-call provider and was told to stop (but to try again if diarrhea stopped) He was also instructed to stop protonix.  Yesterday morning had a regular bowel movement but when he wiped and saw blood. Stool was formed and red. Another BM 10 mins ago and was greenish brown. No blood/red.  No longer having diarrhea.   Reports he is breathing really heavy. Very short of breath. No fever Occasional cough with phelm.  Denies dizziness or lightheaded  Blood sugar has been elevated but no blurred visit.   Having increase swelling in ankle. Eating right, no increase chest pain.   Review of Systems:  Review of Systems  Constitutional: Negative for chills and fever.  Eyes: Negative for blurred vision.  Respiratory: Positive for cough, sputum production and shortness of breath.   Cardiovascular: Positive for leg swelling (ankle swelling). Negative for chest pain and palpitations.  Gastrointestinal: Positive for blood in stool. Negative for constipation and diarrhea.  Genitourinary: Negative for dysuria, frequency and urgency.  Musculoskeletal: Positive for myalgias.       Low back pain on right side   Neurological: Negative for dizziness, sensory change and headaches.    Past Medical History:  Diagnosis Date  . Acute respiratory failure ([place]) 03/01/2018  . Arthritis    "all over; mostly knees and back" (02/28/2018)  . CHF (congestive heart failure) (HCC)    not on any meds  . Chronic lower back  pain    stenosis  . CKD (chronic kidney disease), stage V ([place])    Jacob [name] 02/28/2018  . Community acquired pneumonia 09/06/2013  . Coronary atherosclerosis of native coronary artery 2005   s/p surgery  . Drug abuse ([place])    hx; tested for cocaine as recently as 2/08. says he is not using drugs now - avoided defib. for this reason   . GERD (gastroesophageal reflux disease)     takes OTC meds as needed  . GI bleeding 02/06/2019  . Glaucoma    uses eye drops daily  . Hepatitis B 1968   "tx'd w/isolation; caught it from toilet stools in gym"  . History of colon polyps    benign  . History of gout    takes Allopurinol daily as well as Colchicine-if needed (02/28/2018)  . History of kidney stones   . HTN (hypertension)    takes Coreg,Imdur.and Apresoline daily  . Human immunodeficiency virus (HIV) disease ([place]) dx'd 1995   takes Genvoya daily  . Hyperlipidemia    takes Atorvastatin daily  . Ischemic cardiomyopathy   . Muscle spasm    takes Zanaflex as needed  . Myocardial infarction ([place]) ~ 2004/2005  . Nocturia   . Peripheral neuropathy    takes gabapentin daily  . Pneumonia    "at least twice" (02/28/2018)  . Shortness of breath dyspnea    rarely but if notices it then with exertion  . Syphilis, unspecified   . Type II diabetes mellitus ([place]) 2004   Lantus daily.Average fasting blood sugar 125-199  . Wears glasses   . Wears partial dentures    Past Surgical History:  Procedure Laterality Date  . AV FISTULA PLACEMENT Left 08/02/2018   Procedure: ARTERIOVENOUS (AV) FISTULA CREATION  left arm radiocephlic;  Surgeon: Jacob Heck, MD;  Location: [facility];  Service: Vascular;  Laterality: Left;  . BACK SURGERY    . CARDIAC CATHETERIZATION  10/2002; 12/19/2004   Jacob [name] 03/08/2011  . COLONOSCOPY  2013   Jacob [name]   . CORONARY ARTERY BYPASS GRAFT  02/24/2003   CABG X2/notes 03/08/2011  . ESOPHAGOGASTRODUODENOSCOPY (EGD) WITH PROPOFOL N/A 02/08/2019   Procedure: ESOPHAGOGASTRODUODENOSCOPY (EGD) WITH PROPOFOL;  Surgeon: Jacob Banister, MD;  Location: [facility];  Service: Gastroenterology;  Laterality: N/A;  . FRACTURE SURGERY    . HOT HEMOSTASIS N/A 02/08/2019   Procedure: HOT HEMOSTASIS (ARGON PLASMA COAGULATION/BICAP);  Surgeon: Jacob Banister, MD;  Location[facility]COPY;  Service: Gastroenterology;  Laterality: N/A;  . INTERTROCHANTERIC  HIP FRACTURE SURGERY Left 11/2006   Jacob [name] 03/08/2011  . LAPAROSCOPIC CHOLECYSTECTOMY  05/2006  . LIGATION OF COMPETING BRANCHES OF ARTERIOVENOUS FISTULA Left 11/05/2018   Procedure: LIGATION OF COMPETING BRANCHES OF ARTERIOVENOUS FISTULA  LEFT  ARM;  Surgeon: Jacob Heck, MD;  Location: [facility];  Service: Vascular;  Laterality: Left;  . LUMBAR LAMINECTOMY/DECOMPRESSION MICRODISCECTOMY N/A 02/29/2016   Procedure: Left L4-5 Lateral Recess Decompression, Removal Extradural Intraspinal Facet Cyst;  Surgeon: Marybelle Killings, MD;  Location: [facility];  Service: Orthopedics;  Laterality: N/A;  . MULTIPLE TOOTH EXTRACTIONS    . ORIF MANDIBULAR FRACTURE Left 08/13/2004   ORIF of left body fracture mandible with KLS Martin 2.3-mm six hole/notes 03/08/2011   Social History:   reports that he has been smoking cigarettes. He has a 43.00 pack-year smoking history. He has never used smokeless tobacco. He reports current alcohol use of about 12.0 standard drinks of alcohol per week. He reports that he does not  use drugs.  Family History  Problem Relation Age of Onset  . Heart failure Father   . Hypertension Father   . Diabetes Brother   . Heart attack Brother   . Alzheimer's disease Mother   . Stroke Sister   . Diabetes Sister   . Alzheimer's disease Sister   . Hypertension Brother   . Diabetes Brother   . Drug abuse Brother   . Colon cancer Neg Hx     Medications: Patient's Medications  New Prescriptions   No medications on file  Previous Medications   ACETAMINOPHEN (TYLENOL) 500 MG TABLET    Take 500 mg by mouth every 6 (six) hours as needed for moderate pain.   ALLOPURINOL (ZYLOPRIM) 100 MG TABLET    Take 1 tablet (100 mg total) by mouth daily.   ASPIRIN EC 81 MG TABLET    Take 81 mg by mouth daily.   ATORVASTATIN (LIPITOR) 80 MG TABLET    Take 1 tablet (80 mg total) by mouth daily.   B-D UF III MINI PEN NEEDLES 31G X 5 MM MISC    use four times a day   BICTEGRAVIR-EMTRICITABINE-TENOFOVIR AF  (BIKTARVY) 50-200-25 MG TABS TABLET    Take 1 tablet by mouth daily.   BLOOD GLUCOSE MONITORING SUPPL (ONE TOUCH ULTRA MINI) W/DEVICE KIT    1 each by Does not apply route 2 (two) times daily. Dx: E11.40   CADEXOMER IODINE (IODOSORB) 0.9 % GEL    Apply 1 application topically daily as needed for wound care.   CARVEDILOL (COREG) 25 MG TABLET    TAKE 1 TABLET BY MOUTH TWICE A DAY WITH MEALS   CHOLECALCIFEROL (VITAMIN D3) 50 MCG (2000 UT) TABS    Take 2,000 Units by mouth daily.   CHOLINE FENOFIBRATE (FENOFIBRIC ACID) 135 MG CPDR    TAKE 1 CAPSULE BY MOUTH DAILY   COLCHICINE 0.6 MG TABLET    Take one tablet by mouth as needed for gout   FERROUS SULFATE 325 (65 FE) MG TABLET    Take 1 tablet (325 mg total) by mouth 2 (two) times daily with a meal.   FUROSEMIDE (LASIX) 80 MG TABLET    Take 1.5 tablets (120 mg total) by mouth 2 (two) times daily.   GABAPENTIN (NEURONTIN) 300 MG CAPSULE    TAKE 1 CAPSULE BY MOUTH THREE TIMES DAILY   GLUCOSAMINE-CHONDROIT-VIT C-MN (GLUCOSAMINE CHONDR 1500 COMPLX PO)    Take 2 tablets by mouth daily.    GLUCOSE BLOOD TEST STRIP    Use to test blood sugar three times daily. E11.40   HYDRALAZINE (APRESOLINE) 25 MG TABLET    Take 1 tablet (25 mg total) by mouth 3 (three) times daily.   INSULIN GLARGINE (LANTUS SOLOSTAR) 100 UNIT/ML SOLOSTAR PEN    Inject 74 Units into the skin every morning.   INSULIN LISPRO (HUMALOG KWIKPEN) 100 UNIT/ML KIWKPEN    Inject 18 units subcutaneously three times daily before meals   IODINE-SODIUM IODIDE 2-2.4 % SOLUTION    Apply topically once. To toe   ISOSORBIDE MONONITRATE (IMDUR) 30 MG 24 HR TABLET    Take 1 tablet (30 mg total) by mouth daily.   LANCETS (ONETOUCH DELICA PLUS [id number]) MISC    1 each by Does not apply route 3 (three) times daily. Use to test blood sugar three times daily. Dx: E11.40   LATANOPROST (XALATAN) 0.005 % OPHTHALMIC SOLUTION    Place 1 drop into both eyes at bedtime. Reported on 04/04/2016   NITROGLYCERIN (  NITROSTAT)  0.3 MG SL TABLET    Place 1 tablet (0.3 mg total) under the tongue as needed for chest pain (not to exceed 3 tablets in one day.).   PANTOPRAZOLE (PROTONIX) 40 MG TABLET    Take 1 tablet (40 mg total) by mouth daily.   SODIUM BICARBONATE 650 MG TABLET    Take 650 mg by mouth 2 (two) times daily.    UMECLIDINIUM-VILANTEROL (ANORO ELLIPTA) 62.5-25 MCG/INH AEPB    INHALE 1 PUFF BY MOUTH EVERY DAY  Modified Medications   No medications on file  Discontinued Medications   No medications on file     Physical Exam: unable due to tele-visit   Labs reviewed: Basic Metabolic Panel: Recent Labs    02/28/18 1640 03/01/18 0300  02/06/19 1045 02/07/19 0642 02/07/19 1956 02/09/19 0518  NA 137 137   < > 138 140  --  139  K 6.8* 5.4*   < > 4.5 4.5 4.4 4.7  CL 106 106   < > 108 107  --  108  CO2 22 23   < > 17* 19*  --  20*  GLUCOSE 292* 234*   < > 263* 147*  --  163*  BUN 60* 66*   < > 99* 98*  --  91*  CREATININE 4.70* 4.22*   < > 5.74* 5.63*  --  5.08*  CALCIUM 9.4 9.0   < > 8.8* 9.1  --  8.9  PHOS 3.5 3.7  --   --   --   --   --    < > = values in this interval not displayed.   Liver Function Tests: Recent Labs    02/28/18 0546  03/01/18 0300 11/29/18 0933 02/06/19 1045 02/07/19 0642  AST 37  --   --  _0 ALT 25  --   --  _1 ALKPHOS 36*  --   --   --  36* 40  BILITOT 1.0  --   --  0.4 0.5 0.9  PROT 7.4  --   --  7.9 7.1 7.6  ALBUMIN 3.4*   < > 3.2*  --  3.3* 3.4*   < > = values in this interval not displayed.   Recent Labs    02/06/19 1045  LIPASE 110*   No results for input(s): AMMONIA in the last 8760 hours. CBC: Recent Labs    02/27/18 1600  03/13/18 1034  02/06/19 1045  02/07/19 0642 02/08/19 0509 02/09/19 0518  WBC 5.7   < > 6.2  --  5.8  --  6.8 7.0 5.9  NEUTROABS 2,491  --  3,150  --  2.7  --   --   --   --   HGB 12.8*   < > 12.8*   < > 6.4*   < > 8.2* 8.0* 8.1*  HCT 36.8*   < > 36.9*   < > 20.3*   < > 26.0* 26.1* 25.8*  MCV 95.8   < > 95.1   --  96.2  --  91.9 94.9 93.5  PLT 186   < > 222  --  199  --  201 195 211   < > = values in this interval not displayed.   Lipid Panel: Recent Labs    05/30/18 1007 11/29/18 0933  CHOL 178 183  HDL 29* 33*  LDLCALC 112* 114*  TRIG 245* 240*  CHOLHDL 6.1* 5.5*   TSH: No results  for input(s): TSH in the last 8760 hours. A1C: Lab Results  Component Value Date   HGBA1C 7.3 (H) 02/08/2019     Assessment/Plan 1. Shortness of breath Progressive worsening shortness of breath that he feel like is associated with pneumonia due to low back pain. He also notes swelling in lower leg with hx of CHF and recent hospitalization for GI bleed and symptomatic anemia.   2. Gastric AVM Hx of anemia and GI bleed, had GI consultation. Shortness of breath possible coming from severe anemia.  Pt with hx of ischemic cardiomyopathy, anemia, GI bleed, CKD stage4, DM and others. At high risk of complications, instructed to go to the hospital immediatly for evaluation of shortness of breath and blood in stools. He reports he would not call EMS due to not wanting to pay the bill. Will call his friend to take him to the emergency room.    [name]. Harle Battiest  [facility] [phone]  Follow Up Instructions:    I discussed the assessment and treatment plan with the patient. The patient was provided an opportunity to ask questions and all were answered. The patient agreed with the plan and demonstrated an understanding of the instructions.   The patient was advised to call back or seek an in-person evaluation if the symptoms worsen or if the condition fails to improve as anticipated.  I provided 25 minutes of non-face-to-face time during this encounter.   Jacob Mustache, NP

## 2019-02-20 NOTE — Telephone Encounter (Signed)
Patient called said that he was told to stop taking Pantoprazole 40 and only take the Iron 325MG , but he said that last night he had a very bad bloody stool and would like to know if that is normal.

## 2019-02-20 NOTE — Telephone Encounter (Signed)
He does not need pantoprazole unless he has GERD symptoms (based on his recent endoscopy).  He does need chronic iron therapy, so I would recommend resuming (it did not say why he did not feel that he could take oral iron).  Finally, if he has recurrent bleeding he should go to the hospital for evaluation.  He may need colonoscopy (which was discussed but not performed during his recent hospitalization).  I appreciate him calling in

## 2019-02-20 NOTE — Telephone Encounter (Signed)
Patient encouraged to continue iron supplement.  He verbalized understanding to return to ED for eval if there is additional bleeding episodes.

## 2019-02-21 ENCOUNTER — Other Ambulatory Visit: Payer: Self-pay | Admitting: Internal Medicine

## 2019-02-23 NOTE — Progress Notes (Signed)
Cardiology Office Note   Date:  02/25/2019   ID:  GRACESON NICHELSON, DOB 09/09/1951, MRN [id number]  PCP:  [name], NP  Cardiologist:  Dr. [name]    No chief complaint on file.     History of Present Illness:  68 y.o. with distant CABG in 2004 with EF improving from 24% to 45-50% after revascularization. Last myovue 2016 with scar no ischemia. Has done well with medical Rx and cath avoided due to CRF Now on dialysis  Followed by Dr [name] Had revision of left arm fistula January 2019  Comorbid conditions include HIV previous drug abuse HTN and HLD  No angina.   Echo 02/07/19 EF 40-45% Severe LAE no significant valve disease   Myovue 12/13/18 abnormal EF 26% large inferior wall infarct   Cr 5.1 02/09/19  Hct 25.8 transfused 02/11/19  EGD AVM;s Rx did not have colonoscopy Iron Rx given   Needs dialysis Fistula placed August 02 2018 Dr Carlis Abbott   Dr [name] has increased his lasix Still anemia with dyspnea Getting iron infusions.  He is in mild CHF in office today No LE edema but rales left base greater than right    Past Medical History:  Diagnosis Date  . Acute respiratory failure ([facility]) 03/01/2018  . Arthritis    "all over; mostly knees and back" (02/28/2018)  . CHF (congestive heart failure) (HCC)    not on any meds  . Chronic lower back pain    stenosis  . CKD (chronic kidney disease), stage V ([place])    [name] 02/28/2018  . Community acquired pneumonia 09/06/2013  . Coronary atherosclerosis of native coronary artery 2005   s/p surgery  . Drug abuse ([place])    hx; tested for cocaine as recently as 2/08. says he is not using drugs now - avoided defib. for this reason   . GERD (gastroesophageal reflux disease)    takes OTC meds as needed  . GI bleeding 02/06/2019  . Glaucoma    uses eye drops daily  . Hepatitis B 1968   "tx'd w/isolation; caught it from toilet stools in gym"  . History of colon polyps    benign  . History of gout    takes Allopurinol daily as well as  Colchicine-if needed (02/28/2018)  . History of kidney stones   . HTN (hypertension)    takes Coreg,Imdur.and Apresoline daily  . Human immunodeficiency virus (HIV) disease ([place]) dx'd 1995   takes Genvoya daily  . Hyperlipidemia    takes Atorvastatin daily  . Ischemic cardiomyopathy   . Muscle spasm    takes Zanaflex as needed  . Myocardial infarction ([place]) ~ 2004/2005  . Nocturia   . Peripheral neuropathy    takes gabapentin daily  . Pneumonia    "at least twice" (02/28/2018)  . Shortness of breath dyspnea    rarely but if notices it then with exertion  . Syphilis, unspecified   . Type II diabetes mellitus ([place]) 2004   Lantus daily.Average fasting blood sugar 125-199  . Wears glasses   . Wears partial dentures     Past Surgical History:  Procedure Laterality Date  . AV FISTULA PLACEMENT Left 08/02/2018   Procedure: ARTERIOVENOUS (AV) FISTULA CREATION  left arm radiocephlic;  Surgeon: Marty Heck, MD;  Location: [facility];  Service: Vascular;  Laterality: Left;  . BACK SURGERY    . CARDIAC CATHETERIZATION  10/2002; 12/19/2004   [name] 03/08/2011  . COLONOSCOPY  2013   Dr.John Henrene Pastor   .  CORONARY ARTERY BYPASS GRAFT  02/24/2003   CABG X2/notes 03/08/2011  . ESOPHAGOGASTRODUODENOSCOPY (EGD) WITH PROPOFOL N/A 02/08/2019   Procedure: ESOPHAGOGASTRODUODENOSCOPY (EGD) WITH PROPOFOL;  Surgeon: Milus Banister, MD;  Location: [facility];  Service: Gastroenterology;  Laterality: N/A;  . FRACTURE SURGERY    . HOT HEMOSTASIS N/A 02/08/2019   Procedure: HOT HEMOSTASIS (ARGON PLASMA COAGULATION/BICAP);  Surgeon: Milus Banister, MD;  Location[facility] ENDOSCOPY;  Service: Gastroenterology;  Laterality: N/A;  . INTERTROCHANTERIC HIP FRACTURE SURGERY Left 11/2006   [name] 03/08/2011  . LAPAROSCOPIC CHOLECYSTECTOMY  05/2006  . LIGATION OF COMPETING BRANCHES OF ARTERIOVENOUS FISTULA Left 11/05/2018   Procedure: LIGATION OF COMPETING BRANCHES OF ARTERIOVENOUS FISTULA  LEFT  ARM;  Surgeon: Marty Heck, MD;  Location: [facility];  Service: Vascular;  Laterality: Left;  . LUMBAR LAMINECTOMY/DECOMPRESSION MICRODISCECTOMY N/A 02/29/2016   Procedure: Left L4-5 Lateral Recess Decompression, Removal Extradural Intraspinal Facet Cyst;  Surgeon: Marybelle Killings, MD;  Location: [facility];  Service: Orthopedics;  Laterality: N/A;  . MULTIPLE TOOTH EXTRACTIONS    . ORIF MANDIBULAR FRACTURE Left 08/13/2004   ORIF of left body fracture mandible with KLS Martin 2.3-mm six hole/notes 03/08/2011     Current Outpatient Medications  Medication Sig Dispense Refill  . acetaminophen (TYLENOL) 500 MG tablet Take 500 mg by mouth every 6 (six) hours as needed for moderate pain.    [name] allopurinol (ZYLOPRIM) 100 MG tablet Take 1 tablet (100 mg total) by mouth daily. 90 tablet 1  . aspirin EC 81 MG tablet Take 81 mg by mouth daily.    [name] atorvastatin (LIPITOR) 80 MG tablet Take 1 tablet (80 mg total) by mouth daily. 90 tablet 3  . B-D UF III MINI PEN NEEDLES 31G X 5 MM MISC USE FOUR TIMES DAILY 100 each 11  . BIKTARVY 50-200-25 MG TABS tablet TAKE 1 TABLET BY MOUTH DAILY 30 tablet 3  . Blood Glucose Monitoring Suppl (ONE TOUCH ULTRA MINI) w/Device KIT 1 each by Does not apply route 2 (two) times daily. Dx: E11.40 1 each 0  . cadexomer iodine (IODOSORB) 0.9 % gel Apply 1 application topically daily as needed for wound care. 40 g 5  . carvedilol (COREG) 25 MG tablet TAKE 1 TABLET BY MOUTH TWICE A DAY WITH MEALS 180 tablet 2  . Cholecalciferol (VITAMIN D3) 50 MCG (2000 UT) TABS Take 2,000 Units by mouth daily.    . Choline Fenofibrate (FENOFIBRIC ACID) 135 MG CPDR TAKE 1 CAPSULE BY MOUTH DAILY 90 capsule 1  . colchicine 0.6 MG tablet Take one tablet by mouth as needed for gout 30 tablet 2  . furosemide (LASIX) 80 MG tablet Take 1.5 tablets (120 mg total) by mouth 2 (two) times daily. 120 tablet 0  . gabapentin (NEURONTIN) 300 MG capsule TAKE 1 CAPSULE BY MOUTH THREE TIMES DAILY 270 capsule 1  . Glucosamine-Chondroit-Vit  C-Mn (GLUCOSAMINE CHONDR 1500 COMPLX PO) Take 2 tablets by mouth daily.     [name] glucose blood test strip Use to test blood sugar three times daily. E11.40 300 each 3  . hydrALAZINE (APRESOLINE) 25 MG tablet Take 1 tablet (25 mg total) by mouth 3 (three) times daily. 270 tablet 0  . Insulin Glargine (LANTUS SOLOSTAR) 100 UNIT/ML Solostar Pen Inject 74 Units into the skin every morning.    . insulin lispro (HUMALOG KWIKPEN) 100 UNIT/ML KiwkPen Inject 18 units subcutaneously three times daily before meals 33 mL 3  . iodine-sodium iodide 2-2.4 % solution Apply topically once.  To toe    . isosorbide mononitrate (IMDUR) 30 MG 24 hr tablet Take 1 tablet (30 mg total) by mouth daily.    . Lancets (ONETOUCH DELICA PLUS MYTRZN35A) MISC 1 each by Does not apply route 3 (three) times daily. Use to test blood sugar three times daily. Dx: E11.40 300 each 3  . latanoprost (XALATAN) 0.005 % ophthalmic solution Place 1 drop into both eyes at bedtime. Reported on 04/04/2016    . nitroGLYCERIN (NITROSTAT) 0.3 MG SL tablet Place 1 tablet (0.3 mg total) under the tongue as needed for chest pain (not to exceed 3 tablets in one day.). 75 tablet 0  . sodium bicarbonate 650 MG tablet Take 650 mg by mouth 2 (two) times daily.   3  . umeclidinium-vilanterol (ANORO ELLIPTA) 62.5-25 MCG/INH AEPB INHALE 1 PUFF BY MOUTH EVERY DAY 60 each 5   No current facility-administered medications for this visit.     Allergies:   Augmentin [amoxicillin-pot clavulanate] and Amphetamines    Social History:  The patient  reports that he has been smoking cigarettes. He has a 43.00 pack-year smoking history. He has never used smokeless tobacco. He reports current alcohol use of about 12.0 standard drinks of alcohol per week. He reports that he does not use drugs.   Family History:  The patient's family history includes Alzheimer's disease in his mother and sister; Diabetes in his brother, brother, and sister; Drug abuse in his brother; Heart  attack in his brother; Heart failure in his father; Hypertension in his brother and father; Stroke in his sister.    ROS:  General:no colds or fevers,  weight down 3 lbs by our scales but at home back to baseline Skin:no rashes or ulcers HEENT:no blurred vision, no congestion CV:see HPI PUL:see HPI GI:no diarrhea constipation or melena, no indigestion GU:no hematuria, no dysuria MS:no joint pain, no claudication Neuro:no syncope, no lightheadedness Endo:+ diabetes glucose stable, no thyroid disease  Wt Readings from Last 3 Encounters:  02/25/19 95.4 kg  02/14/19 88.9 kg  02/09/19 89.4 kg     PHYSICAL EXAM: VS:  BP (!) 160/60   Pulse 85   Ht '6\' 2"'  (1.88 m)   Wt 95.4 kg   SpO2 96%   BMI 27.01 kg/m  , BMI Body mass index is 27.01 kg/m. Affect appropriate Chronically ill black male  HEENT: normal Neck supple with no adenopathy JVP normal no bruits no thyromegaly Lungs clear with no wheezing and good diaphragmatic motion Heart:  S1/S2 no murmur, no rub, gallop or click PMI normal Abdomen: benighn, BS positve, no tenderness, no AAA no bruit.  No HSM or HJR Distal pulses intact with no bruits No edema Neuro non-focal Skin warm and dry No muscular weakness Left arm fistual from wrist to brachial thrill somewhat poor     EKG:   10/21/17 SR rate 77 PVC possible old IMI lateral T wave inversion    Recent Labs: 02/06/2019: B Natriuretic Peptide 852.1 02/07/2019: ALT 21 02/09/2019: BUN 91; Creatinine, Ser 5.08; Hemoglobin 8.1; Platelets 211; Potassium 4.7; Sodium 139    Lipid Panel    Component Value Date/Time   CHOL 183 11/29/2018 0933   CHOL 121 08/07/2015 1041   TRIG 240 (H) 11/29/2018 0933   HDL 33 (L) 11/29/2018 0933   HDL 32 (L) 08/07/2015 1041   CHOLHDL 5.5 (H) 11/29/2018 0933   VLDL 48 (H) 03/15/2017 0924   LDLCALC 114 (H) 11/29/2018 7014       Other studies Reviewed:  Additional studies/ records that were reviewed today include: recent OV note.   ECHO 11/10/2016 ------------------------------------------------------------------- Study Conclusions  - Left ventricle: Septal and inferior wall hypokinesis The cavity   size was mildly dilated. There was moderate concentric   hypertrophy. Systolic function was mildly reduced. The estimated   ejection fraction was in the range of 45% to 50%. Doppler   parameters are consistent with both elevated ventricular   end-diastolic filling pressure and elevated left atrial filling   pressure. - Left atrium: The atrium was mildly dilated. - Atrial septum: No defect or patent foramen ovale was identified.  ASSESSMENT AND PLAN:  1.  Acute on chronic systolic and diastolic HF this is related to CRF  EF 40-45% echo April 2020  His diuretics have been increased by nephrology Our hands are tied in regard to doing right and left cath until he is on dialysis He is at high risk for worsening CHF  2. CAD no angina, Distant CABG 2004.  Myovue 12/13/18 high risk due to EF 26% large inferior wall infarct Troponin elevated recent hospitalization ? Demand with Hb 6 and fistula with higher output Cath delayed until dialysis started   3. DM:  Discussed low carb diet.  Target hemoglobin A1c is 6.5 or less.  Continue current medications.  4. CRF:  Had revision of fistula  11/05/18 f/u nephrology and [facility] Dr Carlis Abbott I think he is at high risk for recurrent CHF f/u [place] lasix dose increased   5. Cholesterol:  On statin labs with primary   6. HTN:  Well controlled.  Continue current medications and low sodium Dash type diet.    F/U in 3 months   Jenkins Rouge, MD

## 2019-02-24 ENCOUNTER — Other Ambulatory Visit: Payer: Self-pay | Admitting: Nurse Practitioner

## 2019-02-24 DIAGNOSIS — E119 Type 2 diabetes mellitus without complications: Secondary | ICD-10-CM

## 2019-02-25 ENCOUNTER — Other Ambulatory Visit: Payer: Self-pay

## 2019-02-25 ENCOUNTER — Encounter: Payer: Self-pay | Admitting: Cardiovascular Disease

## 2019-02-25 ENCOUNTER — Ambulatory Visit (INDEPENDENT_AMBULATORY_CARE_PROVIDER_SITE_OTHER): Payer: Medicare Other | Admitting: Podiatry

## 2019-02-25 ENCOUNTER — Encounter: Payer: Self-pay | Admitting: Podiatry

## 2019-02-25 ENCOUNTER — Ambulatory Visit (INDEPENDENT_AMBULATORY_CARE_PROVIDER_SITE_OTHER): Payer: Medicare Other | Admitting: Cardiovascular Disease

## 2019-02-25 VITALS — BP 160/60 | HR 85 | Ht 74.0 in | Wt 210.4 lb

## 2019-02-25 VITALS — Temp 98.1°F

## 2019-02-25 DIAGNOSIS — I5030 Unspecified diastolic (congestive) heart failure: Secondary | ICD-10-CM

## 2019-02-25 DIAGNOSIS — L989 Disorder of the skin and subcutaneous tissue, unspecified: Secondary | ICD-10-CM | POA: Diagnosis not present

## 2019-02-25 DIAGNOSIS — E0842 Diabetes mellitus due to underlying condition with diabetic polyneuropathy: Secondary | ICD-10-CM | POA: Diagnosis not present

## 2019-02-25 NOTE — Patient Instructions (Signed)
Medication Instructions:   If you need a refill on your cardiac medications before your next appointment, please call your pharmacy.   Lab work:  If you have labs (blood work) drawn today and your tests are completely normal, you will receive your results only by: [name] MyChart Message (if you have MyChart) OR . A paper copy in the mail If you have any lab test that is abnormal or we need to change your treatment, we will call you to review the results.  Testing/Procedures: None ordered today.  Follow-Up: At [facility], you and your health needs are our priority.  As part of our continuing mission to provide you with exceptional heart care, we have created designated Provider Care Teams.  These Care Teams include your primary Cardiologist (physician) and Advanced Practice Providers (APPs -  Physician Assistants and Nurse Practitioners) who all work together to provide you with the care you need, when you need it. You will need a follow up appointment in 3 months.    You may see Dr. [name] or one of the following Advanced Practice Providers on your designated Care Team:   Truitt Merle, NP Cecilie Kicks, NP . Kathyrn Drown, NP

## 2019-02-25 NOTE — Progress Notes (Signed)
Subjective: Patient type II DM presents to the office today for chief complaint of painful callus lesions of the feet and for follow-up treatment regarding an ulcer to the left great toe.  Patient states that the Betadine work today is feeling much better.  He has been wearing his diabetic shoes as directed and applying Betadine to the ulceration site.  Past Medical History:  Diagnosis Date  . Acute respiratory failure ([place]) 03/01/2018  . Arthritis    "all over; mostly knees and back" (02/28/2018)  . CHF (congestive heart failure) (HCC)    not on any meds  . Chronic lower back pain    stenosis  . CKD (chronic kidney disease), stage V ([place])    [name] 02/28/2018  . Community acquired pneumonia 09/06/2013  . Coronary atherosclerosis of native coronary artery 2005   s/p surgery  . Drug abuse ([place])    hx; tested for cocaine as recently as 2/08. says he is not using drugs now - avoided defib. for this reason   . GERD (gastroesophageal reflux disease)    takes OTC meds as needed  . GI bleeding 02/06/2019  . Glaucoma    uses eye drops daily  . Hepatitis B 1968   "tx'd w/isolation; caught it from toilet stools in gym"  . History of colon polyps    benign  . History of gout    takes Allopurinol daily as well as Colchicine-if needed (02/28/2018)  . History of kidney stones   . HTN (hypertension)    takes Coreg,Imdur.and Apresoline daily  . Human immunodeficiency virus (HIV) disease ([place]) dx'd 1995   takes Genvoya daily  . Hyperlipidemia    takes Atorvastatin daily  . Ischemic cardiomyopathy   . Muscle spasm    takes Zanaflex as needed  . Myocardial infarction ([place]) ~ 2004/2005  . Nocturia   . Peripheral neuropathy    takes gabapentin daily  . Pneumonia    "at least twice" (02/28/2018)  . Shortness of breath dyspnea    rarely but if notices it then with exertion  . Syphilis, unspecified   . Type II diabetes mellitus ([place]) 2004   Lantus daily.Average fasting blood sugar 125-199  .  Wears glasses   . Wears partial dentures      Objective:  Physical Exam General: Alert and oriented x3 in no acute distress  Dermatology: Hyperkeratotic lesion present on the bilateral feet x3.  Ulceration to the left great toe appears to be healed.  Complete reepithelialization has occurred.  Vascular: Diminished pedal pulses bilaterally. No edema or erythema noted. Capillary refill within normal limits.  Neurological: Epicritic and protective threshold diminished bilaterally.   Musculoskeletal Exam: Pain on palpation at the keratotic lesion noted. Range of motion within normal limits bilateral. Muscle strength 5/5 in all groups bilateral.  Assessment: 1.  Pre-ulcerative callus lesions bilateral x3 2.  Diabetes mellitus with polyneuropathy   Plan of Care:  1. Patient evaluated 2. Excisional debridement of keratoic lesion using a chisel blade was performed without incident.  3.  Return to clinic in 3 months for routine care  Edrick Kins, DPM [facility]  Dr. Edrick Kins, [address]                                        [place], [place] [address]  Office [phone]  Fax [phone]

## 2019-02-28 ENCOUNTER — Other Ambulatory Visit (HOSPITAL_COMMUNITY): Payer: Self-pay | Admitting: *Deleted

## 2019-03-01 ENCOUNTER — Other Ambulatory Visit: Payer: Medicare Other

## 2019-03-01 ENCOUNTER — Other Ambulatory Visit: Payer: Self-pay

## 2019-03-01 ENCOUNTER — Encounter (HOSPITAL_COMMUNITY)
Admission: RE | Admit: 2019-03-01 | Discharge: 2019-03-01 | Disposition: A | Discharge status: Home / Self Care | Payer: Medicare Other | Source: Ambulatory Visit | Attending: Nephrology | Admitting: Nephrology

## 2019-03-01 VITALS — BP 162/81 | HR 77 | Temp 97.6°F | Resp 20 | Ht 74.0 in | Wt 200.2 lb

## 2019-03-01 DIAGNOSIS — D638 Anemia in other chronic diseases classified elsewhere: Secondary | ICD-10-CM | POA: Diagnosis present

## 2019-03-01 DIAGNOSIS — Z9289 Personal history of other medical treatment: Secondary | ICD-10-CM

## 2019-03-01 HISTORY — DX: Personal history of other medical treatment: Z92.89

## 2019-03-01 LAB — PREPARE RBC (CROSSMATCH)

## 2019-03-01 MED ORDER — EPOETIN ALFA-EPBX 10000 UNIT/ML IJ SOLN
40000.0000 [IU] | Freq: Once | INTRAMUSCULAR | Status: AC
  Administered 2019-03-01: 40000 [IU] via SUBCUTANEOUS
  Filled 2019-03-01: qty 4

## 2019-03-01 MED ORDER — SODIUM CHLORIDE 0.9% IV SOLUTION: Freq: Once | INTRAVENOUS | Status: DC

## 2019-03-01 MED ORDER — EPOETIN ALFA-EPBX 40000 UNIT/ML IJ SOLN: 40000.0000 [IU] | INTRAMUSCULAR | Status: DC

## 2019-03-01 MED ORDER — SODIUM CHLORIDE 0.9 % IV SOLN
510.0000 mg | INTRAVENOUS | Status: DC
  Administered 2019-03-01: 510 mg via INTRAVENOUS
  Filled 2019-03-01: qty 17

## 2019-03-01 NOTE — Discharge Instructions (Signed)
Epoetin Alfa injection °What is this medicine? °EPOETIN ALFA (e POE e tin AL fa) helps your body make more red blood cells. This medicine is used to treat anemia caused by chronic kidney disease, cancer chemotherapy, or HIV-therapy. It may also be used before surgery if you have anemia. °This medicine may be used for other purposes; ask your health care provider or pharmacist if you have questions. °COMMON BRAND NAME(S): Epogen, Procrit, Retacrit °What should I tell my health care provider before I take this medicine? °They need to know if you have any of these conditions: °-cancer °-heart disease °-high blood pressure °-history of blood clots °-history of stroke °-low levels of folate, iron, or vitamin B12 in the blood °-seizures °-an unusual or allergic reaction to erythropoietin, albumin, benzyl alcohol, hamster proteins, other medicines, foods, dyes, or preservatives °-pregnant or trying to get pregnant °-breast-feeding °How should I use this medicine? °This medicine is for injection into a vein or under the skin. It is usually given by a health care professional in a hospital or clinic setting. °If you get this medicine at home, you will be taught how to prepare and give this medicine. Use exactly as directed. Take your medicine at regular intervals. Do not take your medicine more often than directed. °It is important that you put your used needles and syringes in a special sharps container. Do not put them in a trash can. If you do not have a sharps container, call your pharmacist or healthcare provider to get one. °A special MedGuide will be given to you by the pharmacist with each prescription and refill. Be sure to read this information carefully each time. °Talk to your pediatrician regarding the use of this medicine in children. While this drug may be prescribed for selected conditions, precautions do apply. °Overdosage: If you think you have taken too much of this medicine contact a poison control center  or emergency room at once. °NOTE: This medicine is only for you. Do not share this medicine with others. °What if I miss a dose? °If you miss a dose, take it as soon as you can. If it is almost time for your next dose, take only that dose. Do not take double or extra doses. °What may interact with this medicine? °Interactions have not been studied. °This list may not describe all possible interactions. Give your health care provider a list of all the medicines, herbs, non-prescription drugs, or dietary supplements you use. Also tell them if you smoke, drink alcohol, or use illegal drugs. Some items may interact with your medicine. °What should I watch for while using this medicine? °Your condition will be monitored carefully while you are receiving this medicine. °You may need blood work done while you are taking this medicine. °This medicine may cause a decrease in vitamin B6. You should make sure that you get enough vitamin B6 while you are taking this medicine. Discuss the foods you eat and the vitamins you take with your health care professional. °What side effects may I notice from receiving this medicine? °Side effects that you should report to your doctor or health care professional as soon as possible: °-allergic reactions like skin rash, itching or hives, swelling of the face, lips, or tongue °-seizures °-signs and symptoms of a blood clot such as breathing problems; changes in vision; chest pain; severe, sudden headache; pain, swelling, warmth in the leg; trouble speaking; sudden numbness or weakness of the face, arm or leg °-signs and symptoms of a stroke   like changes in vision; confusion; trouble speaking or understanding; severe headaches; sudden numbness or weakness of the face, arm or leg; trouble walking; dizziness; loss of balance or coordination °Side effects that usually do not require medical attention (report to your doctor or health care professional if they continue or are  bothersome): °-chills °-cough °-dizziness °-fever °-headaches °-joint pain °-muscle cramps °-muscle pain °-nausea, vomiting °-pain, redness, or irritation at site where injected °This list may not describe all possible side effects. Call your doctor for medical advice about side effects. You may report side effects to FDA at 1-800-FDA-1088. °Where should I keep my medicine? °Keep out of the reach of children. °Store in a refrigerator between 2 and 8 degrees C (36 and 46 degrees F). Do not freeze or shake. Throw away any unused portion if using a single-dose vial. Multi-dose vials can be kept in the refrigerator for up to 21 days after the initial dose. Throw away unused medicine. °NOTE: This sheet is a summary. It may not cover all possible information. If you have questions about this medicine, talk to your doctor, pharmacist, or health care provider. °© 2019 Elsevier/Gold Standard (2017-05-19 08:35:19) °Ferumoxytol injection °What is this medicine? °FERUMOXYTOL is an iron complex. Iron is used to make healthy red blood cells, which carry oxygen and nutrients throughout the body. This medicine is used to treat iron deficiency anemia. °This medicine may be used for other purposes; ask your health care provider or pharmacist if you have questions. °COMMON BRAND NAME(S): Feraheme °What should I tell my health care provider before I take this medicine? °They need to know if you have any of these conditions: °-anemia not caused by low iron levels °-high levels of iron in the blood °-magnetic resonance imaging (MRI) test scheduled °-an unusual or allergic reaction to iron, other medicines, foods, dyes, or preservatives °-pregnant or trying to get pregnant °-breast-feeding °How should I use this medicine? °This medicine is for injection into a vein. It is given by a health care professional in a hospital or clinic setting. °Talk to your pediatrician regarding the use of this medicine in children. Special care may be  needed. °Overdosage: If you think you have taken too much of this medicine contact a poison control center or emergency room at once. °NOTE: This medicine is only for you. Do not share this medicine with others. °What if I miss a dose? °It is important not to miss your dose. Call your doctor or health care professional if you are unable to keep an appointment. °What may interact with this medicine? °This medicine may interact with the following medications: °-other iron products °This list may not describe all possible interactions. Give your health care provider a list of all the medicines, herbs, non-prescription drugs, or dietary supplements you use. Also tell them if you smoke, drink alcohol, or use illegal drugs. Some items may interact with your medicine. °What should I watch for while using this medicine? °Visit your doctor or healthcare professional regularly. Tell your doctor or healthcare professional if your symptoms do not start to get better or if they get worse. You may need blood work done while you are taking this medicine. °You may need to follow a special diet. Talk to your doctor. Foods that contain iron include: whole grains/cereals, dried fruits, beans, or peas, leafy green vegetables, and organ meats (liver, kidney). °What side effects may I notice from receiving this medicine? °Side effects that you should report to your doctor or health care   professional as soon as possible: °-allergic reactions like skin rash, itching or hives, swelling of the face, lips, or tongue °-breathing problems °-changes in blood pressure °-feeling faint or lightheaded, falls °-fever or chills °-flushing, sweating, or hot feelings °-swelling of the ankles or feet °Side effects that usually do not require medical attention (report to your doctor or health care professional if they continue or are bothersome): °-diarrhea °-headache °-nausea, vomiting °-stomach pain °This list may not describe all possible side effects.  Call your doctor for medical advice about side effects. You may report side effects to FDA at 1-800-FDA-1088. °Where should I keep my medicine? °This drug is given in a hospital or clinic and will not be stored at home. °NOTE: This sheet is a summary. It may not cover all possible information. If you have questions about this medicine, talk to your doctor, pharmacist, or health care provider. °© 2019 Elsevier/Gold Standard (2016-11-28 20:21:10) ° °

## 2019-03-02 LAB — TYPE AND SCREEN
ABO/RH(D): A POS
Antibody Screen: NEGATIVE
Unit division: 0

## 2019-03-02 LAB — BPAM RBC
Blood Product Expiration Date: 202005142359
ISSUE DATE / TIME: 202005080936
Unit Type and Rh: 6200

## 2019-03-04 ENCOUNTER — Ambulatory Visit (INDEPENDENT_AMBULATORY_CARE_PROVIDER_SITE_OTHER): Payer: Medicare Other | Admitting: Nurse Practitioner

## 2019-03-04 ENCOUNTER — Other Ambulatory Visit: Payer: Self-pay

## 2019-03-04 ENCOUNTER — Encounter: Payer: Self-pay | Admitting: Nurse Practitioner

## 2019-03-04 DIAGNOSIS — I2510 Atherosclerotic heart disease of native coronary artery without angina pectoris: Secondary | ICD-10-CM

## 2019-03-04 DIAGNOSIS — I5042 Chronic combined systolic (congestive) and diastolic (congestive) heart failure: Secondary | ICD-10-CM

## 2019-03-04 DIAGNOSIS — J449 Chronic obstructive pulmonary disease, unspecified: Secondary | ICD-10-CM

## 2019-03-04 DIAGNOSIS — E11621 Type 2 diabetes mellitus with foot ulcer: Secondary | ICD-10-CM

## 2019-03-04 DIAGNOSIS — K31819 Angiodysplasia of stomach and duodenum without bleeding: Secondary | ICD-10-CM

## 2019-03-04 DIAGNOSIS — E785 Hyperlipidemia, unspecified: Secondary | ICD-10-CM

## 2019-03-04 DIAGNOSIS — E1169 Type 2 diabetes mellitus with other specified complication: Secondary | ICD-10-CM | POA: Diagnosis not present

## 2019-03-04 DIAGNOSIS — L97419 Non-pressure chronic ulcer of right heel and midfoot with unspecified severity: Secondary | ICD-10-CM

## 2019-03-04 DIAGNOSIS — N184 Chronic kidney disease, stage 4 (severe): Secondary | ICD-10-CM

## 2019-03-04 DIAGNOSIS — M109 Gout, unspecified: Secondary | ICD-10-CM

## 2019-03-04 DIAGNOSIS — D638 Anemia in other chronic diseases classified elsewhere: Secondary | ICD-10-CM

## 2019-03-04 DIAGNOSIS — I10 Essential (primary) hypertension: Secondary | ICD-10-CM

## 2019-03-04 LAB — POCT HEMOGLOBIN-HEMACUE: Hemoglobin: 8.4 g/dL — ABNORMAL LOW (ref 13.0–17.0)

## 2019-03-04 MED ORDER — TETANUS-DIPHTH-ACELL PERTUSSIS 5-2.5-18.5 LF-MCG/0.5 IM SUSP: 0.5000 mL | Freq: Once | INTRAMUSCULAR | 0 refills | Status: AC

## 2019-03-04 NOTE — Patient Instructions (Addendum)
Diet modifications are important for your chronic conditions. Please make food choices low in sodium and sugar and complex carboyhdrates vs simple carbs  DASH Eating Plan DASH stands for "Dietary Approaches to Stop Hypertension." The DASH eating plan is a healthy eating plan that has been shown to reduce high blood pressure (hypertension). It may also reduce your risk for type 2 diabetes, heart disease, and stroke. The DASH eating plan may also help with weight loss. What are tips for following this plan?  General guidelines  Avoid eating more than 2,300 mg (milligrams) of salt (sodium) a day. If you have hypertension, you may need to reduce your sodium intake to 1,500 mg a day.  Limit alcohol intake to no more than 1 drink a day for nonpregnant women and 2 drinks a day for men. One drink equals 12 oz of beer, 5 oz of wine, or 1 oz of hard liquor.  Work with your health care provider to maintain a healthy body weight or to lose weight. Ask what an ideal weight is for you.  Get at least 30 minutes of exercise that causes your heart to beat faster (aerobic exercise) most days of the week. Activities may include walking, swimming, or biking.  Work with your health care provider or diet and nutrition specialist (dietitian) to adjust your eating plan to your individual calorie needs. Reading food labels   Check food labels for the amount of sodium per serving. Choose foods with less than 5 percent of the Daily Value of sodium. Generally, foods with less than 300 mg of sodium per serving fit into this eating plan.  To find whole grains, look for the word "whole" as the first word in the ingredient list. Shopping  Buy products labeled as "low-sodium" or "no salt added."  Buy fresh foods. Avoid canned foods and premade or frozen meals. Cooking  Avoid adding salt when cooking. Use salt-free seasonings or herbs instead of table salt or sea salt. Check with your health care provider or pharmacist  before using salt substitutes.  Do not fry foods. Cook foods using healthy methods such as baking, boiling, grilling, and broiling instead.  Cook with heart-healthy oils, such as olive, canola, soybean, or sunflower oil. Meal planning  Eat a balanced diet that includes: ? 5 or more servings of fruits and vegetables each day. At each meal, try to fill half of your plate with fruits and vegetables. ? Up to 6-8 servings of whole grains each day. ? Less than 6 oz of lean meat, poultry, or fish each day. A 3-oz serving of meat is about the same size as a deck of cards. One egg equals 1 oz. ? 2 servings of low-fat dairy each day. ? A serving of nuts, seeds, or beans 5 times each week. ? Heart-healthy fats. Healthy fats called Omega-3 fatty acids are found in foods such as flaxseeds and coldwater fish, like sardines, salmon, and mackerel.  Limit how much you eat of the following: ? Canned or prepackaged foods. ? Food that is high in trans fat, such as fried foods. ? Food that is high in saturated fat, such as fatty meat. ? Sweets, desserts, sugary drinks, and other foods with added sugar. ? Full-fat dairy products.  Do not salt foods before eating.  Try to eat at least 2 vegetarian meals each week.  Eat more home-cooked food and less restaurant, buffet, and fast food.  When eating at a restaurant, ask that your food be prepared with less  salt or no salt, if possible. What foods are recommended? The items listed may not be a complete list. Talk with your dietitian about what dietary choices are best for you. Grains Whole-grain or whole-wheat bread. Whole-grain or whole-wheat pasta. Brown rice. [place]. Bulgur. Whole-grain and low-sodium cereals. Pita bread. Low-fat, low-sodium crackers. Whole-wheat flour tortillas. Vegetables Fresh or frozen vegetables (raw, steamed, roasted, or grilled). Low-sodium or reduced-sodium tomato and vegetable juice. Low-sodium or reduced-sodium tomato  sauce and tomato paste. Low-sodium or reduced-sodium canned vegetables. Fruits All fresh, dried, or frozen fruit. Canned fruit in natural juice (without added sugar). Meat and other protein foods Skinless chicken or [place]. Ground chicken or [place]. Pork with fat trimmed off. Fish and seafood. Egg whites. Dried beans, peas, or lentils. Unsalted nuts, nut butters, and seeds. Unsalted canned beans. Lean cuts of beef with fat trimmed off. Low-sodium, lean deli meat. Dairy Low-fat (1%) or fat-free (skim) milk. Fat-free, low-fat, or reduced-fat cheeses. Nonfat, low-sodium ricotta or cottage cheese. Low-fat or nonfat yogurt. Low-fat, low-sodium cheese. Fats and oils Soft margarine without trans fats. Vegetable oil. Low-fat, reduced-fat, or light mayonnaise and salad dressings (reduced-sodium). Canola, safflower, olive, soybean, and sunflower oils. Avocado. Seasoning and other foods Herbs. Spices. Seasoning mixes without salt. Unsalted popcorn and pretzels. Fat-free sweets. What foods are not recommended? The items listed may not be a complete list. Talk with your dietitian about what dietary choices are best for you. Grains Baked goods made with fat, such as croissants, muffins, or some breads. Dry pasta or rice meal packs. Vegetables Creamed or fried vegetables. Vegetables in a cheese sauce. Regular canned vegetables (not low-sodium or reduced-sodium). Regular canned tomato sauce and paste (not low-sodium or reduced-sodium). Regular tomato and vegetable juice (not low-sodium or reduced-sodium). Angie Fava. Olives. Fruits Canned fruit in a light or heavy syrup. Fried fruit. Fruit in cream or butter sauce. Meat and other protein foods Fatty cuts of meat. Ribs. Fried meat. Berniece Salines. Sausage. Bologna and other processed lunch meats. Salami. Fatback. Hotdogs. Bratwurst. Salted nuts and seeds. Canned beans with added salt. Canned or smoked fish. Whole eggs or egg yolks. Chicken or [place] with skin. Dairy Whole  or 2% milk, cream, and half-and-half. Whole or full-fat cream cheese. Whole-fat or sweetened yogurt. Full-fat cheese. Nondairy creamers. Whipped toppings. Processed cheese and cheese spreads. Fats and oils Butter. Stick margarine. Lard. Shortening. Ghee. Bacon fat. Tropical oils, such as coconut, palm kernel, or palm oil. Seasoning and other foods Salted popcorn and pretzels. Onion salt, garlic salt, seasoned salt, table salt, and sea salt. Worcestershire sauce. Tartar sauce. Barbecue sauce. Teriyaki sauce. Soy sauce, including reduced-sodium. Steak sauce. Canned and packaged gravies. Fish sauce. Oyster sauce. Cocktail sauce. Horseradish that you find on the shelf. Ketchup. Mustard. Meat flavorings and tenderizers. Bouillon cubes. Hot sauce and Tabasco sauce. Premade or packaged marinades. Premade or packaged taco seasonings. Relishes. Regular salad dressings. Where to find more information:  National Heart, Lung, and [place]: https://wilson-eaton.com/  American Heart Association: www.heart.org Summary  The DASH eating plan is a healthy eating plan that has been shown to reduce high blood pressure (hypertension). It may also reduce your risk for type 2 diabetes, heart disease, and stroke.  With the DASH eating plan, you should limit salt (sodium) intake to 2,300 mg a day. If you have hypertension, you may need to reduce your sodium intake to 1,500 mg a day.  When on the DASH eating plan, aim to eat more fresh fruits and vegetables, whole grains, lean proteins, low-fat  dairy, and heart-healthy fats.  Work with your health care provider or diet and nutrition specialist (dietitian) to adjust your eating plan to your individual calorie needs. This information is not intended to replace advice given to you by your health care provider. Make sure you discuss any questions you have with your health care provider. Document Released: 09/29/2011 Document Revised: 10/03/2016 Document Reviewed: 10/03/2016  Elsevier Interactive Patient Education  2019 [employer].   Diabetes Mellitus and Nutrition, Adult When you have diabetes (diabetes mellitus), it is very important to have healthy eating habits because your blood sugar (glucose) levels are greatly affected by what you eat and drink. Eating healthy foods in the appropriate amounts, at about the same times every day, can help you:  Control your blood glucose.  Lower your risk of heart disease.  Improve your blood pressure.  Reach or maintain a healthy weight. Every person with diabetes is different, and each person has different needs for a meal plan. Your health care provider may recommend that you work with a diet and nutrition specialist (dietitian) to make a meal plan that is best for you. Your meal plan may vary depending on factors such as:  The calories you need.  The medicines you take.  Your weight.  Your blood glucose, blood pressure, and cholesterol levels.  Your activity level.  Other health conditions you have, such as heart or kidney disease. How do carbohydrates affect me? Carbohydrates, also called carbs, affect your blood glucose level more than any other type of food. Eating carbs naturally raises the amount of glucose in your blood. Carb counting is a method for keeping track of how many carbs you eat. Counting carbs is important to keep your blood glucose at a healthy level, especially if you use insulin or take certain oral diabetes medicines. It is important to know how many carbs you can safely have in each meal. This is different for every person. Your dietitian can help you calculate how many carbs you should have at each meal and for each snack. Foods that contain carbs include:  Bread, cereal, rice, pasta, and crackers.  Potatoes and corn.  Peas, beans, and lentils.  Milk and yogurt.  Fruit and juice.  Desserts, such as cakes, cookies, ice cream, and candy. How does alcohol affect me? Alcohol can  cause a sudden decrease in blood glucose (hypoglycemia), especially if you use insulin or take certain oral diabetes medicines. Hypoglycemia can be a life-threatening condition. Symptoms of hypoglycemia (sleepiness, dizziness, and confusion) are similar to symptoms of having too much alcohol. If your health care provider says that alcohol is safe for you, follow these guidelines:  Limit alcohol intake to no more than 1 drink per day for nonpregnant women and 2 drinks per day for men. One drink equals 12 oz of beer, 5 oz of wine, or 1 oz of hard liquor.  Do not drink on an empty stomach.  Keep yourself hydrated with water, diet soda, or unsweetened iced tea.  Keep in mind that regular soda, juice, and other mixers may contain a lot of sugar and must be counted as carbs. What are tips for following this plan?  Reading food labels  Start by checking the serving size on the "Nutrition Facts" label of packaged foods and drinks. The amount of calories, carbs, fats, and other nutrients listed on the label is based on one serving of the item. Many items contain more than one serving per package.  Check the total grams (  g) of carbs in one serving. You can calculate the number of servings of carbs in one serving by dividing the total carbs by 15. For example, if a food has 30 g of total carbs, it would be equal to 2 servings of carbs.  Check the number of grams (g) of saturated and trans fats in one serving. Choose foods that have low or no amount of these fats.  Check the number of milligrams (mg) of salt (sodium) in one serving. Most people should limit total sodium intake to less than 2,300 mg per day.  Always check the nutrition information of foods labeled as "low-fat" or "nonfat". These foods may be higher in added sugar or refined carbs and should be avoided.  Talk to your dietitian to identify your daily goals for nutrients listed on the label. Shopping  Avoid buying canned, premade, or  processed foods. These foods tend to be high in fat, sodium, and added sugar.  Shop around the outside edge of the grocery store. This includes fresh fruits and vegetables, bulk grains, fresh meats, and fresh dairy. Cooking  Use low-heat cooking methods, such as baking, instead of high-heat cooking methods like deep frying.  Cook using healthy oils, such as olive, canola, or sunflower oil.  Avoid cooking with butter, cream, or high-fat meats. Meal planning  Eat meals and snacks regularly, preferably at the same times every day. Avoid going long periods of time without eating.  Eat foods high in fiber, such as fresh fruits, vegetables, beans, and whole grains. Talk to your dietitian about how many servings of carbs you can eat at each meal.  Eat 4-6 ounces (oz) of lean protein each day, such as lean meat, chicken, fish, eggs, or tofu. One oz of lean protein is equal to: ? 1 oz of meat, chicken, or fish. ? 1 egg. ?  cup of tofu.  Eat some foods each day that contain healthy fats, such as avocado, nuts, seeds, and fish. Lifestyle  Check your blood glucose regularly.  Exercise regularly as told by your health care provider. This may include: ? 150 minutes of moderate-intensity or vigorous-intensity exercise each week. This could be brisk walking, biking, or water aerobics. ? Stretching and doing strength exercises, such as yoga or weightlifting, at least 2 times a week.  Take medicines as told by your health care provider.  Do not use any products that contain nicotine or tobacco, such as cigarettes and e-cigarettes. If you need help quitting, ask your health care provider.  Work with a Social worker or diabetes educator to identify strategies to manage stress and any emotional and social challenges. Questions to ask a health care provider  Do I need to meet with a diabetes educator?  Do I need to meet with a dietitian?  What number can I call if I have questions?  When are the  best times to check my blood glucose? Where to find more information:  American Diabetes Association: diabetes.org  Academy of Nutrition and Dietetics: www.eatright.[employer] of Diabetes and Digestive and Kidney Diseases (NIH): [employer] Summary  A healthy meal plan will help you control your blood glucose and maintain a healthy lifestyle.  Working with a diet and nutrition specialist (dietitian) can help you make a meal plan that is best for you.  Keep in mind that carbohydrates (carbs) and alcohol have immediate effects on your blood glucose levels. It is important to count carbs and to use alcohol carefully. This information is not intended  to replace advice given to you by your health care provider. Make sure you discuss any questions you have with your health care provider. Document Released: 07/07/2005 Document Revised: 05/10/2017 Document Reviewed: 11/14/2016 Elsevier Interactive Patient Education  2019 [employer].

## 2019-03-04 NOTE — Progress Notes (Signed)
This service is provided via telemedicine  No vital signs collected/recorded due to the encounter was a telemedicine visit.   Location of patient (ex: home, work):  Home  Patient consents to a telephone visit: Yes  Location of the provider (ex: office, home):  [facility], Office   Name of any referring provider: N/A  Names of all persons participating in the telemedicine service and their role in the encounter:  S.Chrae B/CMA, Sherrie Mustache, NP, and Patient   Time spent on call:  8 min with medical assistant      Careteam: Patient Care Team: [name], NP as PCP - General (Geriatric Medicine) [name], MD as PCP - Infectious Diseases (Infectious Diseases) [name], MD as Consulting Physician (Ophthalmology) [name], MD as Consulting Physician (Cardiology) [name], MD as Consulting Physician (Infectious Diseases) Gardiner Barefoot, DPM as Consulting Physician (Podiatry) Madelon Lips, MD as Consulting Physician (Nephrology)  Advanced Directive information Does Patient Have a Medical Advance Directive?: Yes, Type of Advance Directive: Living will, Does patient want to make changes to medical advance directive?: No - Patient declined  Allergies  Allergen Reactions   Augmentin [Amoxicillin-Pot Clavulanate] Diarrhea    Severe diarrhea    Amphetamines Other (See Comments)    Unknown reaction type    Chief Complaint  Patient presents with   Medical Management of Chronic Issues    3 month follow-up    Immunizations    Will send [facility]P rx to [facility]      HPI: Patient is a 68 y.o. male for routine follow up. Pt with a medical history significant of chronic kidney disease stage V not currently on dialysis (AV fistula placed), diabetes mellitus, coronary artery disease, HIV, hypertension, hyperlipidemia.   2 weeks ago pt reported increased shortness of breath and pain in back, thought could be pneumonia vs CHF  exacerbation vs GI bleed(causing severe anemia), severe anemia due to CKD. He  was encouraged to go to ED for evaluation but decided not to. He went to nephrologist soon after tele-visit who ordered blood transfusion with iron transfusion and shortness of breath improved. Anemia thought to be coming from stage 5 renal disease.   He has followed up with GI and still needed colonoscopy. No additional bloody/tarry stools. Has been off Protonix ( caused diarrhea)  CAD/CHF -Also recent high risk stress test in 11/2018 with plans for cardiac catheterization after he starts on HD which has been put off due to [place]. No chest pain. continues on Imdur, Coreg, high-dose statin. No increase in edema or shortness of breath     Type 2 diabetes mellitus, insulin-dependent:  Hemoglobin A1c A1c 6.8 08/2018 and 7.3 on 02/08/2019. Pt continues on lantus 74 units and Humalog 18 units with meals. Eating popsicles "runs it up" blood sugar is staying high.    Hypertension: reports previously control, does not check blood pressure at home. Home meds included lasix, Coreg, and Imdur.   Hx of diabetic right hallux foot ulcer- ongoing follow up with Dr Amalia Hailey, Podiatry, states the foot is healed.   Gout- no recent flares, continues on allopurinol 100 mg daily   hyperlipidemia taking Lipitor 80 mg daily. LDL was elevated on last labs and therefore Lipitor was increased, reports he attempts heart healthy diet.  Review of Systems:  Review of Systems  Constitutional: Negative for chills and fever.  Eyes: Negative for blurred vision.  Respiratory: Negative for cough, sputum production and shortness of breath.   Cardiovascular:  Positive for leg swelling (ankle swelling). Negative for chest pain and palpitations.  Gastrointestinal: Negative for blood in stool, constipation and diarrhea.  Genitourinary: Negative for dysuria, frequency and urgency.  Musculoskeletal: Negative for joint pain and myalgias.  Neurological: Negative  for dizziness, sensory change and headaches.  Psychiatric/Behavioral: Negative for depression. The patient is not nervous/anxious.     Past Medical History:  Diagnosis Date   Acute respiratory failure ([place]) 03/01/2018   Arthritis    "all over; mostly knees and back" (02/28/2018)   CHF (congestive heart failure) (HCC)    not on any meds   Chronic lower back pain    stenosis   CKD (chronic kidney disease), stage V ([place])    [name] 02/28/2018   Community acquired pneumonia 09/06/2013   Coronary atherosclerosis of native coronary artery 2005   s/p surgery   Drug abuse ([place])    hx; tested for cocaine as recently as 2/08. says he is not using drugs now - avoided defib. for this reason    GERD (gastroesophageal reflux disease)    takes OTC meds as needed   GI bleeding 02/06/2019   Glaucoma    uses eye drops daily   Hepatitis B 1968   "tx'd w/isolation; caught it from toilet stools in gym"   History of blood transfusion 03/01/2019   History of colon polyps    benign   History of gout    takes Allopurinol daily as well as Colchicine-if needed (02/28/2018)   History of kidney stones    HTN (hypertension)    takes Coreg,Imdur.and Apresoline daily   Human immunodeficiency virus (HIV) disease ([place]) dx'd 1995   takes Genvoya daily   Hyperlipidemia    takes Atorvastatin daily   Ischemic cardiomyopathy    Muscle spasm    takes Zanaflex as needed   Myocardial infarction ([place]) ~ 2004/2005   Nocturia    Peripheral neuropathy    takes gabapentin daily   Pneumonia    "at least twice" (02/28/2018)   Shortness of breath dyspnea    rarely but if notices it then with exertion   Syphilis, unspecified    Type II diabetes mellitus ([place]) 2004   Lantus daily.Average fasting blood sugar 125-199   Wears glasses    Wears partial dentures    Past Surgical History:  Procedure Laterality Date   AV FISTULA PLACEMENT Left 08/02/2018   Procedure: ARTERIOVENOUS (AV) FISTULA  CREATION  left arm radiocephlic;  Surgeon: Marty Heck, MD;  Location: [facility] OR;  Service: Vascular;  Laterality: Left;   BACK SURGERY     CARDIAC CATHETERIZATION  10/2002; 12/19/2004   [name] 03/08/2011   COLONOSCOPY  2013   [place]    CORONARY ARTERY BYPASS GRAFT  02/24/2003   CABG X2/notes 03/08/2011   ESOPHAGOGASTRODUODENOSCOPY (EGD) WITH PROPOFOL N/A 02/08/2019   Procedure: ESOPHAGOGASTRODUODENOSCOPY (EGD) WITH PROPOFOL;  Surgeon: Milus Banister, MD;  Location: [place];  Service: Gastroenterology;  Laterality: N/A;   FRACTURE SURGERY     HOT HEMOSTASIS N/A 02/08/2019   Procedure: HOT HEMOSTASIS (ARGON PLASMA COAGULATION/BICAP);  Surgeon: Milus Banister, MD;  Location: [facility] ENDOSCOPY;  Service: Gastroenterology;  Laterality: N/A;   INTERTROCHANTERIC HIP FRACTURE SURGERY Left 11/2006   [name] 03/08/2011   LAPAROSCOPIC CHOLECYSTECTOMY  05/2006   LIGATION OF COMPETING BRANCHES OF ARTERIOVENOUS FISTULA Left 11/05/2018   Procedure: LIGATION OF COMPETING BRANCHES OF ARTERIOVENOUS FISTULA  LEFT  ARM;  Surgeon: Marty Heck, MD;  Location: [place];  Service: Vascular;  Laterality: Left;   LUMBAR LAMINECTOMY/DECOMPRESSION MICRODISCECTOMY N/A 02/29/2016   Procedure: Left L4-5 Lateral Recess Decompression, Removal Extradural Intraspinal Facet Cyst;  Surgeon: Marybelle Killings, MD;  Location: [place];  Service: Orthopedics;  Laterality: N/A;   MULTIPLE TOOTH EXTRACTIONS     ORIF MANDIBULAR FRACTURE Left 08/13/2004   ORIF of left body fracture mandible with KLS Martin 2.3-mm six hole/notes 03/08/2011   Social History:   reports that he has been smoking cigarettes. He has a 43.00 pack-year smoking history. He has never used smokeless tobacco. He reports current alcohol use of about 12.0 standard drinks of alcohol per week. He reports that he does not use drugs.  Family History  Problem Relation Age of Onset   Heart failure Father    Hypertension Father    Diabetes Brother     Heart attack Brother    Alzheimer's disease Mother    Stroke Sister    Diabetes Sister    Alzheimer's disease Sister    Hypertension Brother    Diabetes Brother    Drug abuse Brother    Colon cancer Neg Hx     Medications: Patient's Medications  New Prescriptions   No medications on file  Previous Medications   ACETAMINOPHEN (TYLENOL) 500 MG TABLET    Take 500 mg by mouth every 6 (six) hours as needed for moderate pain.   ALLOPURINOL (ZYLOPRIM) 100 MG TABLET    Take 1 tablet (100 mg total) by mouth daily.   ASPIRIN EC 81 MG TABLET    Take 81 mg by mouth daily.   ATORVASTATIN (LIPITOR) 80 MG TABLET    Take 1 tablet (80 mg total) by mouth daily.   B-D UF III MINI PEN NEEDLES 31G X 5 MM MISC    USE FOUR TIMES DAILY   BIKTARVY 50-200-25 MG TABS TABLET    TAKE 1 TABLET BY MOUTH DAILY   BLOOD GLUCOSE MONITORING SUPPL (ONE TOUCH ULTRA MINI) W/DEVICE KIT    1 each by Does not apply route 2 (two) times daily. Dx: E11.40   CARVEDILOL (COREG) 25 MG TABLET    TAKE 1 TABLET BY MOUTH TWICE A DAY WITH MEALS   CHOLECALCIFEROL (VITAMIN D3) 50 MCG (2000 UT) TABS    Take 2,000 Units by mouth daily.   CHOLINE FENOFIBRATE (FENOFIBRIC ACID) 135 MG CPDR    TAKE 1 CAPSULE BY MOUTH DAILY   COLCHICINE 0.6 MG TABLET    Take one tablet by mouth as needed for gout   FUROSEMIDE (LASIX) 80 MG TABLET    1.5 tablet three times daily   GABAPENTIN (NEURONTIN) 300 MG CAPSULE    TAKE 1 CAPSULE BY MOUTH THREE TIMES DAILY   GLUCOSAMINE-CHONDROIT-VIT C-MN (GLUCOSAMINE CHONDR 1500 COMPLX PO)    Take 2 tablets by mouth daily.    GLUCOSE BLOOD TEST STRIP    Use to test blood sugar three times daily. E11.40   HYDRALAZINE (APRESOLINE) 25 MG TABLET    Take 1 tablet (25 mg total) by mouth 3 (three) times daily.   INSULIN GLARGINE (LANTUS SOLOSTAR) 100 UNIT/ML SOLOSTAR PEN    Inject 74 Units into the skin every morning.   INSULIN LISPRO (HUMALOG KWIKPEN) 100 UNIT/ML KIWKPEN    Inject 18 units subcutaneously three  times daily before meals   ISOSORBIDE MONONITRATE (IMDUR) 30 MG 24 HR TABLET    Take 1 tablet (30 mg total) by mouth daily.   LANCETS (ONETOUCH DELICA PLUS [id number]) MISC    1 each by Does not  apply route 3 (three) times daily. Use to test blood sugar three times daily. Dx: E11.40   LATANOPROST (XALATAN) 0.005 % OPHTHALMIC SOLUTION    Place 1 drop into both eyes at bedtime. Reported on 04/04/2016   NITROGLYCERIN (NITROSTAT) 0.3 MG SL TABLET    Place 1 tablet (0.3 mg total) under the tongue as needed for chest pain (not to exceed 3 tablets in one day.).   SODIUM BICARBONATE 650 MG TABLET    Take 650 mg by mouth 2 (two) times daily.    TDAP (BOOSTRIX) 5-2.5-18.5 LF-MCG/0.5 INJECTION    Inject 0.5 mLs into the muscle once.   UMECLIDINIUM-VILANTEROL (ANORO ELLIPTA) 62.5-25 MCG/INH AEPB    INHALE 1 PUFF BY MOUTH EVERY DAY  Modified Medications   No medications on file  Discontinued Medications   CADEXOMER IODINE (IODOSORB) 0.9 % GEL    Apply 1 application topically daily as needed for wound care.   FUROSEMIDE (LASIX) 80 MG TABLET    Take 1.5 tablets (120 mg total) by mouth 2 (two) times daily.   IODINE-SODIUM IODIDE 2-2.4 % SOLUTION    Apply topically once. To toe     Physical Exam: unable due to televisit.     Labs reviewed: Basic Metabolic Panel: Recent Labs    02/06/19 1045 02/07/19 0642 02/07/19 1956 02/09/19 0518  NA 138 140  --  139  K 4.5 4.5 4.4 4.7  CL 108 107  --  108  CO2 17* 19*  --  20*  GLUCOSE 263* 147*  --  163*  BUN 99* 98*  --  91*  CREATININE 5.74* 5.63*  --  5.08*  CALCIUM 8.8* 9.1  --  8.9   Liver Function Tests: Recent Labs    11/29/18 0933 02/06/19 1045 02/07/19 0642  AST _0 ALT _1 ALKPHOS  --  36* 40  BILITOT 0.4 0.5 0.9  PROT 7.9 7.1 7.6  ALBUMIN  --  3.3* 3.4*   Recent Labs    02/06/19 1045  LIPASE 110*   No results for input(s): AMMONIA in the last 8760 hours. CBC: Recent Labs    03/13/18 1034  02/06/19 1045   02/07/19 0642 02/08/19 0509 02/09/19 0518 03/01/19 0827  WBC 6.2  --  5.8  --  6.8 7.0 5.9  --   NEUTROABS 3,150  --  2.7  --   --   --   --   --   HGB 12.8*   < > 6.4*   < > 8.2* 8.0* 8.1* 8.4*  HCT 36.9*   < > 20.3*   < > 26.0* 26.1* 25.8*  --   MCV 95.1  --  96.2  --  91.9 94.9 93.5  --   PLT 222  --  199  --  201 195 211  --    < > = values in this interval not displayed.   Lipid Panel: Recent Labs    05/30/18 1007 11/29/18 0933  CHOL 178 183  HDL 29* 33*  LDLCALC 112* 114*  TRIG 245* 240*  CHOLHDL 6.1* 5.5*   TSH: No results for input(s): TSH in the last 8760 hours. A1C: Lab Results  Component Value Date   HGBA1C 7.3 (H) 02/08/2019     Assessment/Plan 1. Diabetic ulcer of right heel associated with type 2 diabetes mellitus, unspecified ulcer stage ([place]) Ongoing follow up by podiatry, recently with healed ulcer.  2. Gastric AVM Continues to need work up by GI,  plan for colonoscopy. No further bleeding noted  3. Hyperlipidemia associated with type 2 diabetes mellitus ([place]) lipitor was increased with last labs, needs repeat lipid panel, request this to be done with nephrologist or cardiologist office  Continues on lipitor 80 mg daily with heart healthy diet.  4. Chronic obstructive pulmonary disease, unspecified COPD type (HCC) Stable, without worsening in shortness of breath, cough or congestion. Continues with anora daily  5. Essential hypertension Stable, encouraged low sodium diet with current regimen  6. Coronary artery disease involving native coronary artery of native heart without angina pectoris Stable without chets pains, continues on ASA 81 mg daily with imdur. Continues follow up with cardiology.   7. Gout, unspecified cause, unspecified chronicity, unspecified site -no recent flare, continues on allopurinol. No recent uric acid level, will see if cardiologist or nephrologist will add uric acid level to next blood draw.   8. Anemia of chronic  disease S/p blood transfusion and iron transfusion with improvement in shortness of breath  9. Stage 4 chronic kidney disease (HCC) Has fistula placed, needing HD, following with nephrologist.   10. Chronic combined systolic and diastolic congestive heart failure (HCC) Stable, he needs HD as his renal disease worsens which would improve fluid status.  Continues on lasix, hydralazine, and coreg  Next appt: 06/04/2019 [name]. Harle Battiest  [facility] [phone]   Virtual Visit via Telephone Note  I connected with [name] on 03/04/19 at  4:00 PM EDT by telephone and verified that I am speaking with the correct person using two identifiers.  Location: Patient: home Provider: office    I discussed the limitations, risks, security and privacy concerns of performing an evaluation and management service by telephone and the availability of in person appointments. I also discussed with the patient that there may be a patient responsible charge related to this service. The patient expressed understanding and agreed to proceed.  Follow Up Instructions:    I discussed the assessment and treatment plan with the patient. The patient was provided an opportunity to ask questions and all were answered. The patient agreed with the plan and demonstrated an understanding of the instructions.   The patient was advised to call back or seek an in-person evaluation if the symptoms worsen or if the condition fails to improve as anticipated.  I provided 25 minutes of non-face-to-face time during this encounter.   [name], NP

## 2019-03-05 ENCOUNTER — Telehealth: Payer: Self-pay | Admitting: *Deleted

## 2019-03-05 ENCOUNTER — Ambulatory Visit: Payer: Medicare Other | Admitting: Nurse Practitioner

## 2019-03-05 NOTE — Telephone Encounter (Signed)
Patient called and requested his last OV note to be faxed to Dr. Madelon Lips. Faxed.

## 2019-03-08 ENCOUNTER — Encounter (HOSPITAL_COMMUNITY): Payer: Medicare Other

## 2019-03-08 LAB — LIPID PANEL
Cholesterol: 115 (ref 0–200)
Cholesterol: 115 (ref 0–200)
HDL: 18 — AB (ref 35–70)
HDL: 18 — AB (ref 35–70)
Triglycerides: 674 — AB (ref 40–160)
Triglycerides: 674 — AB (ref 40–160)

## 2019-03-08 LAB — IRON,TIBC AND FERRITIN PANEL
%SAT: 13
Ferritin: 510
Iron: 53
TIBC: 397
UIBC: 344

## 2019-03-08 LAB — CBC AND DIFFERENTIAL
HCT: 32 — AB (ref 41–53)
HCT: 32 — AB (ref 41–53)
Hemoglobin: 10.3 — AB (ref 13.5–17.5)
Hemoglobin: 10.3 — AB (ref 13.5–17.5)
Platelets: 19 — AB (ref 150–399)
Platelets: 213 (ref 150–399)
WBC: 6.1
WBC: 6.1

## 2019-03-08 LAB — BASIC METABOLIC PANEL
BUN: 70 — AB (ref 4–21)
BUN: 70 — AB (ref 4–21)
Creatinine: 5.2 — AB (ref 0.6–1.3)
Creatinine: 5.2 — AB (ref 0.6–1.3)
Glucose: 233
Glucose: 233
Potassium: 4.5 (ref 3.4–5.3)
Potassium: 4.5 (ref 3.4–5.3)
Sodium: 142 (ref 137–147)
Sodium: 142 (ref 137–147)

## 2019-03-11 ENCOUNTER — Encounter (HOSPITAL_COMMUNITY): Payer: Medicare Other

## 2019-03-14 ENCOUNTER — Ambulatory Visit: Payer: Medicare Other | Admitting: Cardiovascular Disease

## 2019-03-15 ENCOUNTER — Other Ambulatory Visit: Payer: Self-pay

## 2019-03-15 ENCOUNTER — Ambulatory Visit (HOSPITAL_COMMUNITY)
Admission: RE | Admit: 2019-03-15 | Discharge: 2019-03-15 | Disposition: A | Discharge status: Home / Self Care | Payer: Medicare Other | Source: Ambulatory Visit | Attending: Nephrology | Admitting: Nephrology

## 2019-03-15 ENCOUNTER — Encounter (HOSPITAL_COMMUNITY): Payer: Medicare Other

## 2019-03-15 VITALS — BP 150/68 | HR 75 | Temp 98.3°F | Resp 20

## 2019-03-15 DIAGNOSIS — D638 Anemia in other chronic diseases classified elsewhere: Secondary | ICD-10-CM | POA: Insufficient documentation

## 2019-03-15 LAB — POCT HEMOGLOBIN-HEMACUE: Hemoglobin: 11.1 g/dL — ABNORMAL LOW (ref 13.0–17.0)

## 2019-03-15 MED ORDER — EPOETIN ALFA-EPBX 40000 UNIT/ML IJ SOLN: 40000.0000 [IU] | INTRAMUSCULAR | Status: DC

## 2019-03-15 MED ORDER — SODIUM CHLORIDE 0.9 % IV SOLN
510.0000 mg | INTRAVENOUS | Status: AC
  Administered 2019-03-15: 510 mg via INTRAVENOUS
  Filled 2019-03-15: qty 510

## 2019-03-15 MED ORDER — EPOETIN ALFA-EPBX 10000 UNIT/ML IJ SOLN
40000.0000 [IU] | Freq: Once | INTRAMUSCULAR | Status: AC
  Administered 2019-03-15: 40000 [IU] via SUBCUTANEOUS
  Filled 2019-03-15: qty 4

## 2019-03-29 ENCOUNTER — Ambulatory Visit (HOSPITAL_COMMUNITY)
Admission: RE | Admit: 2019-03-29 | Discharge: 2019-03-29 | Disposition: A | Discharge status: Home / Self Care | Payer: Medicare Other | Source: Ambulatory Visit | Attending: Nephrology | Admitting: Nephrology

## 2019-03-29 ENCOUNTER — Other Ambulatory Visit: Payer: Self-pay

## 2019-03-29 ENCOUNTER — Encounter: Payer: Medicare Other | Admitting: Nurse Practitioner

## 2019-03-29 VITALS — BP 123/65 | HR 81 | Temp 97.8°F | Resp 20

## 2019-03-29 DIAGNOSIS — D638 Anemia in other chronic diseases classified elsewhere: Secondary | ICD-10-CM | POA: Diagnosis not present

## 2019-03-29 LAB — IRON AND TIBC
Iron: 53 ug/dL (ref 45–182)
Saturation Ratios: 15 % — ABNORMAL LOW (ref 17.9–39.5)
TIBC: 353 ug/dL (ref 250–450)
UIBC: 300 ug/dL

## 2019-03-29 LAB — FERRITIN: Ferritin: 667 ng/mL — ABNORMAL HIGH (ref 24–336)

## 2019-03-29 LAB — POCT HEMOGLOBIN-HEMACUE: Hemoglobin: 12.6 g/dL — ABNORMAL LOW (ref 13.0–17.0)

## 2019-03-29 MED ORDER — EPOETIN ALFA-EPBX 40000 UNIT/ML IJ SOLN
40000.0000 [IU] | INTRAMUSCULAR | Status: DC
  Filled 2019-03-29: qty 1

## 2019-04-01 NOTE — Progress Notes (Signed)
This encounter was created in error - please disregard.

## 2019-04-10 ENCOUNTER — Other Ambulatory Visit: Payer: Self-pay | Admitting: Nurse Practitioner

## 2019-04-10 DIAGNOSIS — I10 Essential (primary) hypertension: Secondary | ICD-10-CM

## 2019-04-12 ENCOUNTER — Ambulatory Visit (HOSPITAL_COMMUNITY)
Admission: RE | Admit: 2019-04-12 | Discharge: 2019-04-12 | Disposition: A | Discharge status: Home / Self Care | Payer: Medicare Other | Source: Ambulatory Visit | Attending: Nephrology | Admitting: Nephrology

## 2019-04-12 ENCOUNTER — Other Ambulatory Visit: Payer: Self-pay

## 2019-04-12 VITALS — BP 117/61 | HR 75 | Temp 97.3°F | Resp 20

## 2019-04-12 DIAGNOSIS — D638 Anemia in other chronic diseases classified elsewhere: Secondary | ICD-10-CM | POA: Diagnosis not present

## 2019-04-12 LAB — POCT HEMOGLOBIN-HEMACUE: Hemoglobin: 13.3 g/dL (ref 13.0–17.0)

## 2019-04-12 MED ORDER — EPOETIN ALFA-EPBX 10000 UNIT/ML IJ SOLN
40000.0000 [IU] | Freq: Once | INTRAMUSCULAR | Status: DC
  Filled 2019-04-12: qty 4

## 2019-04-12 MED ORDER — EPOETIN ALFA-EPBX 40000 UNIT/ML IJ SOLN: 40000.0000 [IU] | INTRAMUSCULAR | Status: DC

## 2019-04-14 ENCOUNTER — Other Ambulatory Visit: Payer: Self-pay | Admitting: Cardiovascular Disease

## 2019-04-14 ENCOUNTER — Other Ambulatory Visit: Payer: Self-pay | Admitting: Nurse Practitioner

## 2019-04-14 DIAGNOSIS — I5042 Chronic combined systolic (congestive) and diastolic (congestive) heart failure: Secondary | ICD-10-CM

## 2019-04-15 ENCOUNTER — Other Ambulatory Visit: Payer: Self-pay | Admitting: *Deleted

## 2019-04-15 MED ORDER — GLUCOSE BLOOD VI STRP: ORAL_STRIP | 3 refills | Status: DC

## 2019-04-15 MED ORDER — INSULIN LISPRO (1 UNIT DIAL) 100 UNIT/ML (KWIKPEN): 18.0000 [IU] | PEN_INJECTOR | Freq: Three times a day (TID) | SUBCUTANEOUS | 0 refills | Status: DC

## 2019-04-15 NOTE — Telephone Encounter (Signed)
[facility]

## 2019-04-15 NOTE — Telephone Encounter (Signed)
[facility] 

## 2019-04-16 ENCOUNTER — Other Ambulatory Visit: Payer: Self-pay | Admitting: Cardiovascular Disease

## 2019-04-16 MED ORDER — CARVEDILOL 25 MG PO TABS: 25.0000 mg | ORAL_TABLET | Freq: Two times a day (BID) | ORAL | 3 refills | Status: DC

## 2019-04-16 NOTE — Telephone Encounter (Signed)
Pt's medication was sent to pt's pharmacy as requested. Confirmation received.  °

## 2019-04-25 ENCOUNTER — Other Ambulatory Visit: Payer: Self-pay

## 2019-04-25 ENCOUNTER — Encounter (HOSPITAL_COMMUNITY)
Admission: RE | Admit: 2019-04-25 | Discharge: 2019-04-25 | Disposition: A | Discharge status: Home / Self Care | Payer: Medicare Other | Source: Ambulatory Visit | Attending: Nephrology | Admitting: Nephrology

## 2019-04-25 VITALS — BP 135/69 | HR 74 | Temp 98.0°F | Resp 20

## 2019-04-25 DIAGNOSIS — D638 Anemia in other chronic diseases classified elsewhere: Secondary | ICD-10-CM | POA: Diagnosis not present

## 2019-04-25 LAB — IRON AND TIBC
Iron: 77 ug/dL (ref 45–182)
Saturation Ratios: 20 % (ref 17.9–39.5)
TIBC: 385 ug/dL (ref 250–450)
UIBC: 308 ug/dL

## 2019-04-25 LAB — FERRITIN: Ferritin: 91 ng/mL (ref 24–336)

## 2019-04-25 MED ORDER — EPOETIN ALFA-EPBX 40000 UNIT/ML IJ SOLN
40000.0000 [IU] | INTRAMUSCULAR | Status: DC
  Filled 2019-04-25: qty 1

## 2019-04-25 MED ORDER — EPOETIN ALFA-EPBX 10000 UNIT/ML IJ SOLN
40000.0000 [IU] | INTRAMUSCULAR | Status: DC
  Filled 2019-04-25: qty 4

## 2019-04-29 LAB — POCT HEMOGLOBIN-HEMACUE: Hemoglobin: 13.8 g/dL (ref 13.0–17.0)

## 2019-04-30 ENCOUNTER — Telehealth: Payer: Self-pay

## 2019-04-30 ENCOUNTER — Other Ambulatory Visit: Payer: Self-pay | Admitting: *Deleted

## 2019-04-30 ENCOUNTER — Other Ambulatory Visit: Payer: Self-pay | Admitting: Nurse Practitioner

## 2019-04-30 MED ORDER — COLCHICINE 0.6 MG PO TABS: ORAL_TABLET | ORAL | 2 refills | Status: DC

## 2019-04-30 NOTE — Telephone Encounter (Signed)
Phone screening complete 

## 2019-04-30 NOTE — Telephone Encounter (Signed)
[facility] [employer] [employer] [facility] and sent to [place] for approval due to [employer].

## 2019-05-01 ENCOUNTER — Encounter: Payer: Self-pay | Admitting: Internal Medicine

## 2019-05-01 ENCOUNTER — Ambulatory Visit (INDEPENDENT_AMBULATORY_CARE_PROVIDER_SITE_OTHER): Payer: Medicare Other | Admitting: Internal Medicine

## 2019-05-01 ENCOUNTER — Other Ambulatory Visit: Payer: Self-pay

## 2019-05-01 ENCOUNTER — Other Ambulatory Visit: Payer: Self-pay | Admitting: Cardiovascular Disease

## 2019-05-01 VITALS — Ht 74.0 in | Wt 200.0 lb

## 2019-05-01 DIAGNOSIS — K31819 Angiodysplasia of stomach and duodenum without bleeding: Secondary | ICD-10-CM | POA: Diagnosis not present

## 2019-05-01 DIAGNOSIS — D500 Iron deficiency anemia secondary to blood loss (chronic): Secondary | ICD-10-CM

## 2019-05-01 DIAGNOSIS — K921 Melena: Secondary | ICD-10-CM

## 2019-05-01 NOTE — Patient Instructions (Signed)
1.   Dr. Henrene Pastor would like him to come back in 6 months for routine GI follow-up to assure that you remain stable. His schedule does not go out that far right now so please call the office a month or two ahead of time to schedule this.  Please to contact the office in the interim for any questions or relevant problems.

## 2019-05-01 NOTE — Progress Notes (Signed)
HISTORY OF PRESENT ILLNESS:  Jacob [name] is a 68 y.o. male with MULTIPLE Significant medical problems including history of diastolic congestive heart failure, prior coronary artery bypass grafting, end-stage renal disease nearing dialysis, diabetes mellitus, and chronic anemia.  Patient presents today for follow-up via telehealth medicine during the coronavirus pandemic.  He was seen in the office virtually February 14, 2019 after being recently hospitalized with melena and anemia.  Upper endoscopy revealed multiple nonbleeding AVMs which were treated endoscopically.  Hemoglobin around that time was 8.0.  Since that time he has been on iron replacement therapy.  No further evidence of bleeding.  Most recent hemoglobin on April 25, 2019 was normal at 13.8.  Patient tells me that he has been doing well from a GI standpoint.  At the time of his last visit we discussed the prospects of colonoscopy to be certain that his bleeding was not from a colonic source.  However, he is felt high risk and nonemergent.  He had been offered this during his hospitalization but declined.  In any event, patient actually has had complete colonoscopy December 2013.  The exam was complete with good preparation.  Aside from a diminutive polyp and moderate sigmoid diverticulosis exam is normal.  Follow-up in 10 years recommended.  The patient was evaluated by his cardiologist in May.  He was found to have mild congestive heart failure at that time.  His last creatinine was 6.24.  He is followed by Dr. [name].  Does have a fistula (AV fistula) in his left upper extremity and is nearing the need for dialysis.  In addition to iron replacement he is on prophylactic pantoprazole.  REVIEW OF SYSTEMS:  All non-GI ROS negative unless otherwise stated in the HPI except for arthritis, back pain, exertional shortness of breath  Past Medical History:  Diagnosis Date  . Acute respiratory failure ([place]) 03/01/2018  . Arthritis    "all over; mostly  knees and back" (02/28/2018)  . CHF (congestive heart failure) (HCC)    not on any meds  . Chronic lower back pain    stenosis  . CKD (chronic kidney disease), stage V ([place])    [name] 02/28/2018  . Community acquired pneumonia 09/06/2013  . Coronary atherosclerosis of native coronary artery 2005   s/p surgery  . Drug abuse ([place])    hx; tested for cocaine as recently as 2/08. says he is not using drugs now - avoided defib. for this reason   . GERD (gastroesophageal reflux disease)    takes OTC meds as needed  . GI bleeding 02/06/2019  . Glaucoma    uses eye drops daily  . Hepatitis B 1968   "tx'd w/isolation; caught it from toilet stools in gym"  . History of blood transfusion 03/01/2019  . History of colon polyps    benign  . History of gout    takes Allopurinol daily as well as Colchicine-if needed (02/28/2018)  . History of kidney stones   . HTN (hypertension)    takes Coreg,Imdur.and Apresoline daily  . Human immunodeficiency virus (HIV) disease ([place]) dx'd 1995   takes Genvoya daily  . Hyperlipidemia    takes Atorvastatin daily  . Ischemic cardiomyopathy   . Muscle spasm    takes Zanaflex as needed  . Myocardial infarction ([place]) ~ 2004/2005  . Nocturia   . Peripheral neuropathy    takes gabapentin daily  . Pneumonia    "at least twice" (02/28/2018)  . Shortness of breath dyspnea  rarely but if notices it then with exertion  . Syphilis, unspecified   . Type II diabetes mellitus ([place]) 2004   Lantus daily.Average fasting blood sugar 125-199  . Wears glasses   . Wears partial dentures     Past Surgical History:  Procedure Laterality Date  . AV FISTULA PLACEMENT Left 08/02/2018   Procedure: ARTERIOVENOUS (AV) FISTULA CREATION  left arm radiocephlic;  Surgeon: Marty Heck, MD;  Location: [place];  Service: Vascular;  Laterality: Left;  . BACK SURGERY    . CARDIAC CATHETERIZATION  10/2002; 12/19/2004   [name] 03/08/2011  . COLONOSCOPY  2013   Dr.Kateria Cutrona Henrene Pastor   .  CORONARY ARTERY BYPASS GRAFT  02/24/2003   CABG X2/notes 03/08/2011  . ESOPHAGOGASTRODUODENOSCOPY (EGD) WITH PROPOFOL N/A 02/08/2019   Procedure: ESOPHAGOGASTRODUODENOSCOPY (EGD) WITH PROPOFOL;  Surgeon: Milus Banister, MD;  Location: [place];  Service: Gastroenterology;  Laterality: N/A;  . FRACTURE SURGERY    . HOT HEMOSTASIS N/A 02/08/2019   Procedure: HOT HEMOSTASIS (ARGON PLASMA COAGULATION/BICAP);  Surgeon: Milus Banister, MD;  Location: [facility] ENDOSCOPY;  Service: Gastroenterology;  Laterality: N/A;  . INTERTROCHANTERIC HIP FRACTURE SURGERY Left 11/2006   [name] 03/08/2011  . LAPAROSCOPIC CHOLECYSTECTOMY  05/2006  . LIGATION OF COMPETING BRANCHES OF ARTERIOVENOUS FISTULA Left 11/05/2018   Procedure: LIGATION OF COMPETING BRANCHES OF ARTERIOVENOUS FISTULA  LEFT  ARM;  Surgeon: Marty Heck, MD;  Location: [place];  Service: Vascular;  Laterality: Left;  . LUMBAR LAMINECTOMY/DECOMPRESSION MICRODISCECTOMY N/A 02/29/2016   Procedure: Left L4-5 Lateral Recess Decompression, Removal Extradural Intraspinal Facet Cyst;  Surgeon: Marybelle Killings, MD;  Location: [place];  Service: Orthopedics;  Laterality: N/A;  . MULTIPLE TOOTH EXTRACTIONS    . ORIF MANDIBULAR FRACTURE Left 08/13/2004   ORIF of left body fracture mandible with KLS Martin 2.3-mm six hole/notes 03/08/2011    Social History Jacob [name]  reports that he has been smoking cigarettes. He has a 43.00 pack-year smoking history. He has never used smokeless tobacco. He reports current alcohol use of about 12.0 standard drinks of alcohol per week. He reports that he does not use drugs.  family history includes Alzheimer's disease in his mother and sister; Diabetes in his brother, brother, and sister; Drug abuse in his brother; Heart attack in his brother; Heart failure in his father; Hypertension in his brother and father; Stroke in his sister.  Allergies  Allergen Reactions  . Augmentin [Amoxicillin-Pot Clavulanate] Diarrhea     Severe diarrhea   . Amphetamines Other (See Comments)    Unknown reaction type       PHYSICAL EXAMINATION: No physical exam information with telehealth medicine visit.  Patient was alert and oriented  ASSESSMENT:  1.  Acute blood loss anemia secondary to bleeding gastrointestinal AVMs status post endoscopic hemostatic therapy February 08, 2019. 2.  Iron deficiency anemia secondary to the same (AVMs).  Corrected with iron replacement therapy. 3.  Multiple medical problems.  Significant 4.  Colonoscopy.  Previous high-quality examination December 2015.  Do not feel that he needs a repeat examination at present.  See below   PLAN:  1.  At this point patient has remained stable since his hospital discharge.  It is highly likely that his iron deficiency anemia and melena were both secondary to identify gastrointestinal AVMs.  He has demonstrated no evidence of recurrent iron deficiency anemia or GI bleeding.  Does have multiple significant medical problems and would be high risk for routine colonoscopy.  He appears to  be moving toward need for dialysis.  At this point I do not feel that colonoscopy is critically important or likely to change management, but nevertheless high risk.  I would like him to continue on IRON THERAPY (he will need this INDEFINITELY with a history of gastrointestinal AVMs) and have ongoing MONITORING of his BLOOD COUNTS as well as clinical monitoring for any evidence of GI bleeding.  Would like him to check back in 6 months for routine GI follow-up to assure that he remains stable.  He has been instructed to contact the office in the interim for any questions or relevant problems. This telehealth medicine visit (audio only) was initiated by and consented for by the patient who was in his home while I was in my office.  Patient understands there may be a professional charge for this service which totaled 15 minutes in duration

## 2019-05-06 ENCOUNTER — Ambulatory Visit (INDEPENDENT_AMBULATORY_CARE_PROVIDER_SITE_OTHER): Payer: Medicare Other | Admitting: Family

## 2019-05-06 ENCOUNTER — Encounter: Payer: Self-pay | Admitting: Family

## 2019-05-06 ENCOUNTER — Other Ambulatory Visit: Payer: Self-pay

## 2019-05-06 DIAGNOSIS — K5901 Slow transit constipation: Secondary | ICD-10-CM

## 2019-05-06 MED ORDER — DOCUSATE SODIUM 100 MG PO CAPS: 100.0000 mg | ORAL_CAPSULE | Freq: Every day | ORAL | 0 refills | Status: DC

## 2019-05-06 NOTE — Patient Instructions (Addendum)
1.Take Colace 100 mg capsule one by mouth daily for constipation.May take every other day if has loose stool. Increase  fiber in diet.continue with current fluid intake.  2. Notify provider's office if symptoms worsen or colace is ineffective.  Constipation, Adult Constipation is when a person:  Poops (has a bowel movement) fewer times in a week than normal.  Has a hard time pooping.  Has poop that is dry, hard, or bigger than normal. Follow these instructions at home: Eating and drinking   Eat foods that have a lot of fiber, such as: ? Fresh fruits and vegetables. ? Whole grains. ? Beans.  Eat less of foods that are high in fat, low in fiber, or overly processed, such as: ? [place] fries. ? Hamburgers. ? Cookies. ? Candy. ? Soda.  Drink enough fluid to keep your pee (urine) clear or pale yellow. General instructions  Exercise regularly or as told by your doctor.  Go to the restroom when you feel like you need to poop. Do not hold it in.  Take over-the-counter and prescription medicines only as told by your doctor. These include any fiber supplements.  Do pelvic floor retraining exercises, such as: ? Doing deep breathing while relaxing your lower belly (abdomen). ? Relaxing your pelvic floor while pooping.  Watch your condition for any changes.  Keep all follow-up visits as told by your doctor. This is important. Contact a doctor if:  You have pain that gets worse.  You have a fever.  You have not pooped for 4 days.  You throw up (vomit).  You are not hungry.  You lose weight.  You are bleeding from the anus.  You have thin, pencil-like poop (stool). Get help right away if:  You have a fever, and your symptoms suddenly get worse.  You leak poop or have blood in your poop.  Your belly feels hard or bigger than normal (is bloated).  You have very bad belly pain.  You feel dizzy or you faint. This information is not intended to replace advice given  to you by your health care provider. Make sure you discuss any questions you have with your health care provider. Document Released: 03/28/2008 Document Revised: 09/22/2017 Document Reviewed: 03/30/2016 Elsevier Patient Education  2020 [employer].

## 2019-05-06 NOTE — Progress Notes (Signed)
This service is provided via telemedicine  No vital signs collected/recorded due to the encounter was a telemedicine visit.   Location of patient (ex: home, work):  Home   Patient consents to a telephone visit:  Yes  Location of the provider (ex: office, home): Office   Name of any referring provider:  Sherrie Mustache, NP   Names of all persons participating in the telemedicine service and their role in the encounter:  Markhi Kleckner NP, Ruthell Rummage CMA, and [name]   Time spent on call:  Ruthell Rummage CMA, spent  13 Minutes on phone with patient.     [facility]  Provider: Marlowe Sax, NP   Code Status: FULL Goals of Care:  Advanced Directives 03/04/2019  Does Patient Have a Medical Advance Directive? Yes  Type of Advance Directive Living will  Does patient want to make changes to medical advance directive? No - Patient declined  Copy of [place] in Chart? -  Would patient like information on creating a medical advance directive? -     Chief Complaint  Patient presents with  . Acute Visit    Constipation duration of 3 days states has been taking imodium and gas pills, states had several bm this morning and seen some blood    HPI: Patient is a 68 y.o. male seen today for an acute visit for constipation.He took imodium and gas pills last week felt like had gas.He had large bowel movement this morning.He noticed blood when he wiped himself.He denies any dark stool or blood in the stool.He states contacted the Kidney doctor to check if lasix was causing him to be constipated but was told to contact PCP.He has Docusate that he bought from [employer] and wonders whether it's okay for him to take it now.He denies any fever,chills,nausea,vomiting,abdominal pain or distention.He states appetite is good.   Past Medical History:  Diagnosis Date  . Acute respiratory failure ([place]) 03/01/2018  . Arthritis    "all over; mostly knees and back" (02/28/2018)  .  CHF (congestive heart failure) (HCC)    not on any meds  . Chronic lower back pain    stenosis  . CKD (chronic kidney disease), stage V ([place])    [name] 02/28/2018  . Community acquired pneumonia 09/06/2013  . Coronary atherosclerosis of native coronary artery 2005   s/p surgery  . Drug abuse ([place])    hx; tested for cocaine as recently as 2/08. says he is not using drugs now - avoided defib. for this reason   . GERD (gastroesophageal reflux disease)    takes OTC meds as needed  . GI bleeding 02/06/2019  . Glaucoma    uses eye drops daily  . Hepatitis B 1968   "tx'd w/isolation; caught it from toilet stools in gym"  . History of blood transfusion 03/01/2019  . History of colon polyps    benign  . History of gout    takes Allopurinol daily as well as Colchicine-if needed (02/28/2018)  . History of kidney stones   . HTN (hypertension)    takes Coreg,Imdur.and Apresoline daily  . Human immunodeficiency virus (HIV) disease ([place]) dx'd 1995   takes Genvoya daily  . Hyperlipidemia    takes Atorvastatin daily  . Ischemic cardiomyopathy   . Muscle spasm    takes Zanaflex as needed  . Myocardial infarction ([place]) ~ 2004/2005  . Nocturia   . Peripheral neuropathy    takes gabapentin daily  . Pneumonia    "  at least twice" (02/28/2018)  . Shortness of breath dyspnea    rarely but if notices it then with exertion  . Syphilis, unspecified   . Type II diabetes mellitus ([place]) 2004   Lantus daily.Average fasting blood sugar 125-199  . Wears glasses   . Wears partial dentures     Past Surgical History:  Procedure Laterality Date  . AV FISTULA PLACEMENT Left 08/02/2018   Procedure: ARTERIOVENOUS (AV) FISTULA CREATION  left arm radiocephlic;  Surgeon: Marty Heck, MD;  Location: [facility];  Service: Vascular;  Laterality: Left;  . BACK SURGERY    . CARDIAC CATHETERIZATION  10/2002; 12/19/2004   [name] 03/08/2011  . COLONOSCOPY  2013   Dr.John Henrene Pastor   . CORONARY ARTERY BYPASS GRAFT   02/24/2003   CABG X2/notes 03/08/2011  . ESOPHAGOGASTRODUODENOSCOPY (EGD) WITH PROPOFOL N/A 02/08/2019   Procedure: ESOPHAGOGASTRODUODENOSCOPY (EGD) WITH PROPOFOL;  Surgeon: Milus Banister, MD;  Location: [facility];  Service: Gastroenterology;  Laterality: N/A;  . FRACTURE SURGERY    . HOT HEMOSTASIS N/A 02/08/2019   Procedure: HOT HEMOSTASIS (ARGON PLASMA COAGULATION/BICAP);  Surgeon: Milus Banister, MD;  Location: [facility] ENDOSCOPY;  Service: Gastroenterology;  Laterality: N/A;  . INTERTROCHANTERIC HIP FRACTURE SURGERY Left 11/2006   [name] 03/08/2011  . LAPAROSCOPIC CHOLECYSTECTOMY  05/2006  . LIGATION OF COMPETING BRANCHES OF ARTERIOVENOUS FISTULA Left 11/05/2018   Procedure: LIGATION OF COMPETING BRANCHES OF ARTERIOVENOUS FISTULA  LEFT  ARM;  Surgeon: Marty Heck, MD;  Location: [facility];  Service: Vascular;  Laterality: Left;  . LUMBAR LAMINECTOMY/DECOMPRESSION MICRODISCECTOMY N/A 02/29/2016   Procedure: Left L4-5 Lateral Recess Decompression, Removal Extradural Intraspinal Facet Cyst;  Surgeon: Marybelle Killings, MD;  Location: [facility];  Service: Orthopedics;  Laterality: N/A;  . MULTIPLE TOOTH EXTRACTIONS    . ORIF MANDIBULAR FRACTURE Left 08/13/2004   ORIF of left body fracture mandible with KLS Martin 2.3-mm six hole/notes 03/08/2011    Allergies  Allergen Reactions  . Augmentin [Amoxicillin-Pot Clavulanate] Diarrhea    Severe diarrhea   . Amphetamines Other (See Comments)    Unknown reaction type    Outpatient Encounter Medications as of 05/06/2019  Medication Sig  . acetaminophen (TYLENOL) 325 MG tablet Take 975 mg by mouth every 6 (six) hours as needed.  [name] acetaminophen (TYLENOL) 500 MG tablet Take 500 mg by mouth every 6 (six) hours as needed.  [name] allopurinol (ZYLOPRIM) 100 MG tablet Take 1 tablet (100 mg total) by mouth daily.  [name] aspirin EC 81 MG tablet Take 81 mg by mouth. Every 3 days  . atorvastatin (LIPITOR) 80 MG tablet Take 1 tablet (80 mg total) by mouth daily.  . B-D  UF III MINI PEN NEEDLES 31G X 5 MM MISC USE FOUR TIMES DAILY  . BIKTARVY 50-200-25 MG TABS tablet TAKE 1 TABLET BY MOUTH DAILY  . Blood Glucose Monitoring Suppl (ONE TOUCH ULTRA MINI) w/Device KIT 1 each by Does not apply route 2 (two) times daily. Dx: E11.40  . carvedilol (COREG) 25 MG tablet Take 1 tablet (25 mg total) by mouth 2 (two) times daily with a meal.  . Cholecalciferol (VITAMIN D3) 50 MCG (2000 UT) TABS Take 2,000 Units by mouth daily.  . Choline Fenofibrate (FENOFIBRIC ACID) 135 MG CPDR TAKE 1 CAPSULE BY MOUTH DAILY  . colchicine 0.6 MG tablet Take one tablet by mouth as needed for gout  . furosemide (LASIX) 80 MG tablet Take 160 mg by mouth daily.   [name] gabapentin (NEURONTIN) 300 MG capsule  TAKE 1 CAPSULE BY MOUTH THREE TIMES DAILY  . Glucosamine-Chondroit-Vit C-Mn (GLUCOSAMINE CHONDR 1500 COMPLX PO) Take 2 tablets by mouth daily.   [name] glucose blood test strip Use to test blood sugar three times daily. E11.40  . hydrALAZINE (APRESOLINE) 25 MG tablet TAKE 1 TABLET(25 MG) BY MOUTH THREE TIMES DAILY  . insulin lispro (HUMALOG KWIKPEN) 100 UNIT/ML KwikPen Inject 0.18 mLs (18 Units total) into the skin 3 (three) times daily.  . isosorbide mononitrate (IMDUR) 30 MG 24 hr tablet TAKE 1 TABLET(30 MG) BY MOUTH DAILY  . Lancets (ONETOUCH DELICA PLUS QASTMH96Q) MISC 1 each by Does not apply route 3 (three) times daily. Use to test blood sugar three times daily. Dx: E11.40  . LANTUS SOLOSTAR 100 UNIT/ML Solostar Pen ADMINISTER 74 UNITS UNDER THE SKIN EVERY EVENING  . latanoprost (XALATAN) 0.005 % ophthalmic solution Place 1 drop into both eyes at bedtime. Reported on 04/04/2016  . nitroGLYCERIN (NITROSTAT) 0.3 MG SL tablet ONE TABLET UNDER TONGUE AS NEEDED FOR CHEST PAIN  . pantoprazole (PROTONIX) 40 MG tablet Take 40 mg by mouth daily.  . sodium bicarbonate 650 MG tablet Take 650 mg by mouth 2 (two) times daily.   [name] umeclidinium-vilanterol (ANORO ELLIPTA) 62.5-25 MCG/INH AEPB INHALE 1 PUFF BY  MOUTH EVERY DAY  . timolol (TIMOPTIC) 0.5 % ophthalmic solution Place 1 drop into both eyes every morning.  . [DISCONTINUED] acetaminophen (TYLENOL) 500 MG tablet Take 500 mg by mouth every 6 (six) hours as needed for moderate pain.   No facility-administered encounter medications on file as of 05/06/2019.     Review of Systems:  Review of Systems  Constitutional: Negative for chills, fatigue and fever.  HENT: Negative for congestion, rhinorrhea, sinus pressure, sinus pain, sneezing and sore throat.   Eyes: Negative for discharge, redness, itching and visual disturbance.  Respiratory: Negative for chest tightness, shortness of breath and wheezing.   Cardiovascular: Negative for chest pain, palpitations and leg swelling.  Gastrointestinal: Positive for constipation. Negative for abdominal distention, abdominal pain, blood in stool, nausea and vomiting.  Skin: Negative for color change, pallor and rash.    Health Maintenance  Topic Date Due  . HEMOGLOBIN A1C  05/10/2019  . INFLUENZA VACCINE  05/25/2019  . OPHTHALMOLOGY EXAM  07/10/2019  . FOOT EXAM  12/06/2019  . LIPID PANEL  03/07/2020  . TETANUS/TDAP  07/27/2021  . COLONOSCOPY  10/01/2022  . Hepatitis C Screening  Completed  . PNA vac Low Risk Adult  Completed    Physical Exam: There were no vitals filed for this visit. There is no height or weight on file to calculate BMI. Physical Exam  Unable to complete on Telephone visit.   Labs reviewed: Basic Metabolic Panel: Recent Labs    02/06/19 1045 02/07/19 0642 02/07/19 1956 02/09/19 0518 03/08/19  NA 138 140  --  139 142  K 4.5 4.5 4.4 4.7 4.5  CL 108 107  --  108  --   CO2 17* 19*  --  20*  --   GLUCOSE 263* 147*  --  163*  --   BUN 99* 98*  --  91* 70*  CREATININE 5.74* 5.63*  --  5.08* 5.2*  CALCIUM 8.8* 9.1  --  8.9  --    Liver Function Tests: Recent Labs    11/29/18 0933 02/06/19 1045 02/07/19 0642  AST '30 25 26  ' ALT '20 20 21  ' ALKPHOS  --  36* 40   BILITOT 0.4 0.5 0.9  PROT  7.9 7.1 7.6  ALBUMIN  --  3.3* 3.4*   Recent Labs    02/06/19 1045  LIPASE 110*   No results for input(s): AMMONIA in the last 8760 hours. CBC: Recent Labs    02/06/19 1045  02/07/19 0642 02/08/19 0509 02/09/19 0518  03/08/19  03/29/19 0840 04/12/19 0857 04/25/19 0954  WBC 5.8  --  6.8 7.0 5.9  --  6.1  --   --   --   --   NEUTROABS 2.7  --   --   --   --   --   --   --   --   --   --   HGB 6.4*   < > 8.2* 8.0* 8.1*   < > 10.3*   < > 12.6* 13.3 13.8  HCT 20.3*   < > 26.0* 26.1* 25.8*  --  32*  --   --   --   --   MCV 96.2  --  91.9 94.9 93.5  --   --   --   --   --   --   PLT 199  --  201 195 211  --  213  --   --   --   --    < > = values in this interval not displayed.   Lipid Panel: Recent Labs    05/30/18 1007 11/29/18 0933 03/08/19  CHOL 178 183 115  HDL 29* 33* 18*  LDLCALC 112* 114*  --   TRIG 245* 240* 674*  CHOLHDL 6.1* 5.5*  --    Lab Results  Component Value Date   HGBA1C 7.3 (H) 02/08/2019    Procedures since last visit: No results found.  Assessment/Plan   Slow transit constipation Start on colace 100 mg capsule one by mouth daily.May take every other day if has loose stool. Increase  fiber in diet.continue with current fluid intake.Notify provider's office if symptoms worsen or colace is ineffective.  Labs/tests ordered: None  Next appt:  06/04/2019

## 2019-05-09 ENCOUNTER — Ambulatory Visit (HOSPITAL_COMMUNITY)
Admission: RE | Admit: 2019-05-09 | Discharge: 2019-05-09 | Disposition: A | Discharge status: Home / Self Care | Payer: Medicare Other | Source: Ambulatory Visit | Attending: Nephrology | Admitting: Nephrology

## 2019-05-09 ENCOUNTER — Other Ambulatory Visit: Payer: Self-pay

## 2019-05-09 VITALS — BP 142/77 | HR 77 | Temp 97.7°F

## 2019-05-09 DIAGNOSIS — D638 Anemia in other chronic diseases classified elsewhere: Secondary | ICD-10-CM | POA: Diagnosis not present

## 2019-05-09 LAB — POCT HEMOGLOBIN-HEMACUE: Hemoglobin: 13.8 g/dL (ref 13.0–17.0)

## 2019-05-09 MED ORDER — EPOETIN ALFA-EPBX 10000 UNIT/ML IJ SOLN
20000.0000 [IU] | INTRAMUSCULAR | Status: DC
  Filled 2019-05-09: qty 2

## 2019-05-16 ENCOUNTER — Other Ambulatory Visit: Payer: Self-pay | Admitting: Nurse Practitioner

## 2019-05-16 MED ORDER — PANTOPRAZOLE SODIUM 40 MG PO TBEC: 40.0000 mg | DELAYED_RELEASE_TABLET | Freq: Every day | ORAL | 1 refills | Status: DC

## 2019-05-16 NOTE — Telephone Encounter (Signed)
Patient called and requested.

## 2019-05-23 ENCOUNTER — Encounter (HOSPITAL_COMMUNITY): Payer: Medicare Other

## 2019-05-27 ENCOUNTER — Encounter: Payer: Self-pay | Admitting: Podiatry

## 2019-05-27 ENCOUNTER — Ambulatory Visit (INDEPENDENT_AMBULATORY_CARE_PROVIDER_SITE_OTHER): Payer: Medicare Other | Admitting: Podiatry

## 2019-05-27 ENCOUNTER — Other Ambulatory Visit: Payer: Self-pay

## 2019-05-27 VITALS — Temp 97.3°F

## 2019-05-27 DIAGNOSIS — M79676 Pain in unspecified toe(s): Secondary | ICD-10-CM | POA: Diagnosis not present

## 2019-05-27 DIAGNOSIS — B351 Tinea unguium: Secondary | ICD-10-CM | POA: Diagnosis not present

## 2019-05-27 DIAGNOSIS — L989 Disorder of the skin and subcutaneous tissue, unspecified: Secondary | ICD-10-CM | POA: Diagnosis not present

## 2019-05-27 DIAGNOSIS — E0842 Diabetes mellitus due to underlying condition with diabetic polyneuropathy: Secondary | ICD-10-CM | POA: Diagnosis not present

## 2019-05-29 NOTE — Progress Notes (Signed)
Subjective: Patient is a 68 y.o. male with PMHx of T2DM presenting to the office today for follow up evaluation painful callus lesions noted to the bilateral feet. He states walking increases the pain and he feels like the lesions need to be trimmed again.  Patient also complains of elongated, thickened nails that cause pain while ambulating in shoes. He is unable to trim his own nails. Patient presents today for further treatment and evaluation.  Past Medical History:  Diagnosis Date  . Acute respiratory failure ([place]) 03/01/2018  . Arthritis    "all over; mostly knees and back" (02/28/2018)  . CHF (congestive heart failure) (HCC)    not on any meds  . Chronic lower back pain    stenosis  . CKD (chronic kidney disease), stage V ([place])    [name] 02/28/2018  . Community acquired pneumonia 09/06/2013  . Coronary atherosclerosis of native coronary artery 2005   s/p surgery  . Drug abuse ([place])    hx; tested for cocaine as recently as 2/08. says he is not using drugs now - avoided defib. for this reason   . GERD (gastroesophageal reflux disease)    takes OTC meds as needed  . GI bleeding 02/06/2019  . Glaucoma    uses eye drops daily  . Hepatitis B 1968   "tx'd w/isolation; caught it from toilet stools in gym"  . History of blood transfusion 03/01/2019  . History of colon polyps    benign  . History of gout    takes Allopurinol daily as well as Colchicine-if needed (02/28/2018)  . History of kidney stones   . HTN (hypertension)    takes Coreg,Imdur.and Apresoline daily  . Human immunodeficiency virus (HIV) disease ([place]) dx'd 1995   takes Genvoya daily  . Hyperlipidemia    takes Atorvastatin daily  . Ischemic cardiomyopathy   . Muscle spasm    takes Zanaflex as needed  . Myocardial infarction ([place]) ~ 2004/2005  . Nocturia   . Peripheral neuropathy    takes gabapentin daily  . Pneumonia    "at least twice" (02/28/2018)  . Shortness of breath dyspnea    rarely but if notices it  then with exertion  . Syphilis, unspecified   . Type II diabetes mellitus ([place]) 2004   Lantus daily.Average fasting blood sugar 125-199  . Wears glasses   . Wears partial dentures     Objective:  Physical Exam General: Alert and oriented x3 in no acute distress  Dermatology: Hyperkeratotic lesions present on the bilateral feet x 4. Pain on palpation with a central nucleated core noted. Skin is warm, dry and supple bilateral lower extremities. Negative for open lesions or macerations. Nails are tender, long, thickened and dystrophic with subungual debris, consistent with onychomycosis, 1-5 bilateral. No signs of infection noted.  Vascular: Palpable pedal pulses bilaterally. No edema or erythema noted. Capillary refill within normal limits.  Neurological: Epicritic and protective threshold diminished bilaterally.   Musculoskeletal Exam: Pain on palpation at the keratotic lesions noted. Range of motion within normal limits bilateral. Muscle strength 5/5 in all groups bilateral.  Assessment: 1. Onychodystrophic nails 1-5 bilateral with hyperkeratosis of nails.  2. Onychomycosis of nail due to dermatophyte bilateral 3. Pre-ulcerative callus lesions noted to the bilateral feet x 4   Plan of Care:  1. Patient evaluated. 2. Excisional debridement of keratoic lesion using a chisel blade was performed without incident.  3. Dressed with light dressing. 4. Mechanical debridement of nails 1-5 bilaterally performed using  a nail nipper. Filed with dremel without incident.  5. Patient is to return to the clinic in 3 months.   Edrick Kins, DPM [facility]  Dr. Edrick Kins, [place]                                        [place], [place] [address]                Office [phone]  Fax [phone]

## 2019-05-30 ENCOUNTER — Telehealth: Payer: Self-pay | Admitting: *Deleted

## 2019-05-30 NOTE — Telephone Encounter (Signed)
Called patient and he stated that he would just rather get labs the day of his appointment and he will bring in his readings.

## 2019-05-30 NOTE — Telephone Encounter (Signed)
Lets see if he will come in prior to appt to get A1c drawn, also make sure he brings his blood sugars (to write down if it is fasting or not) to follow up visit

## 2019-05-30 NOTE — Telephone Encounter (Signed)
Lattie Haw called patient to remind him of his appointment on 06/04/2019 and patient started talking about his blood sugar running high. Lattie Haw gave him to Clinical Intake.   I spoke with patient and he stated that his blood sugar has not been under 200 in over 2 months. Stated that he has had 2 blood transfusions, 1 in April and 1 in [place] and his blood sugar has been running high ever since. Taking medications as directed. No other symptoms or complaints noted.   I offered patient a sooner appointment and he refused. Patient stated that he was fine and would just keep his appointment on 8/11. I told patient to call us if anything changes or he felt like he needed to be seen sooner. He agreed.

## 2019-06-04 ENCOUNTER — Ambulatory Visit (INDEPENDENT_AMBULATORY_CARE_PROVIDER_SITE_OTHER): Payer: Medicare Other | Admitting: Nurse Practitioner

## 2019-06-04 ENCOUNTER — Encounter: Payer: Self-pay | Admitting: Nurse Practitioner

## 2019-06-04 ENCOUNTER — Other Ambulatory Visit: Payer: Self-pay

## 2019-06-04 VITALS — BP 144/80 | HR 79 | Temp 97.6°F | Ht 74.0 in | Wt 205.8 lb

## 2019-06-04 DIAGNOSIS — J449 Chronic obstructive pulmonary disease, unspecified: Secondary | ICD-10-CM | POA: Diagnosis not present

## 2019-06-04 DIAGNOSIS — L97419 Non-pressure chronic ulcer of right heel and midfoot with unspecified severity: Secondary | ICD-10-CM

## 2019-06-04 DIAGNOSIS — E11621 Type 2 diabetes mellitus with foot ulcer: Secondary | ICD-10-CM

## 2019-06-04 DIAGNOSIS — E1122 Type 2 diabetes mellitus with diabetic chronic kidney disease: Secondary | ICD-10-CM

## 2019-06-04 DIAGNOSIS — E1169 Type 2 diabetes mellitus with other specified complication: Secondary | ICD-10-CM

## 2019-06-04 DIAGNOSIS — I2510 Atherosclerotic heart disease of native coronary artery without angina pectoris: Secondary | ICD-10-CM

## 2019-06-04 DIAGNOSIS — E1140 Type 2 diabetes mellitus with diabetic neuropathy, unspecified: Secondary | ICD-10-CM

## 2019-06-04 DIAGNOSIS — I10 Essential (primary) hypertension: Secondary | ICD-10-CM

## 2019-06-04 DIAGNOSIS — M109 Gout, unspecified: Secondary | ICD-10-CM

## 2019-06-04 DIAGNOSIS — Z794 Long term (current) use of insulin: Secondary | ICD-10-CM

## 2019-06-04 DIAGNOSIS — E785 Hyperlipidemia, unspecified: Secondary | ICD-10-CM

## 2019-06-04 DIAGNOSIS — N184 Chronic kidney disease, stage 4 (severe): Secondary | ICD-10-CM

## 2019-06-04 MED ORDER — TOUJEO MAX SOLOSTAR 300 UNIT/ML ~~LOC~~ SOPN: 80.0000 [IU] | PEN_INJECTOR | Freq: Every day | SUBCUTANEOUS | 12 refills | Status: DC

## 2019-06-04 MED ORDER — INSULIN LISPRO (1 UNIT DIAL) 100 UNIT/ML (KWIKPEN): 20.0000 [IU] | PEN_INJECTOR | Freq: Three times a day (TID) | SUBCUTANEOUS | 1 refills | Status: DC

## 2019-06-04 NOTE — Progress Notes (Signed)
Careteam: Patient Care Team: [name], NP as PCP - General (Geriatric Medicine) [name], MD as PCP - Infectious Diseases (Infectious Diseases) [name], MD as Consulting Physician (Ophthalmology) [name], MD as Consulting Physician (Cardiology) [name], MD as Consulting Physician (Infectious Diseases) Gardiner Barefoot, DPM as Consulting Physician (Podiatry) Madelon Lips, MD as Consulting Physician (Nephrology)  Advanced Directive information    Allergies  Allergen Reactions  . Augmentin [Amoxicillin-Pot Clavulanate] Diarrhea    Severe diarrhea   . Amphetamines Other (See Comments)    Unknown reaction type    Chief Complaint  Patient presents with  . Medical Management of Chronic Issues    3 month follow-up. Elevated bloodsugar concerns   . Medication Management    Medication review, patient would like to know if he can come off any medications  . Quality Metric Gaps    A1c due      HPI: Patient is a 68 y.o. male seen in the office today for routine follow up.   Blood sugars have continued to go up after blood transfusion.  Lowest blood sugar since may has been 209. Eating a lot of bread Was eating a lot of popsicles- now down 3 a day eating a lot of bread States sugar free will give him diarrhea.  Taking lantus 74 units daily and humalog 18 units with meals.  Has upped lantus 80 and humalog 20 but did not see any major change  Following with podiatrist -no current ulcers  CKD- not currently on dialysis, Cr continues to rise, seeing nephrologist tomorrow.   Hyperlipidemia- triglycerides elevated on last labs  Gout- no recent flares   Anemia- hx of AVMs, CKD. Followed by GI who recommended continuing iron therapy indefinitely   Review of Systems:  Review of Systems  Constitutional: Negative for chills and fever.  Eyes: Negative for blurred vision.  Respiratory: Negative for cough, sputum production and shortness of  breath.   Cardiovascular: Negative for chest pain, palpitations and leg swelling.  Gastrointestinal: Negative for blood in stool, constipation and diarrhea.  Genitourinary: Negative for dysuria, frequency and urgency.  Musculoskeletal: Negative for joint pain and myalgias.  Neurological: Negative for dizziness, sensory change and headaches.  Psychiatric/Behavioral: Negative for depression. The patient is not nervous/anxious.     Past Medical History:  Diagnosis Date  . Acute respiratory failure ([place]) 03/01/2018  . Arthritis    "all over; mostly knees and back" (02/28/2018)  . CHF (congestive heart failure) ([facility])    not on any meds  . Chronic lower back pain    stenosis  . CKD (chronic kidney disease), stage V ([place])    [name] 02/28/2018  . Community acquired pneumonia 09/06/2013  . Coronary atherosclerosis of native coronary artery 2005   s/p surgery  . Drug abuse ([place])    hx; tested for cocaine as recently as 2/08. says he is not using drugs now - avoided defib. for this reason   . GERD (gastroesophageal reflux disease)    takes OTC meds as needed  . GI bleeding 02/06/2019  . Glaucoma    uses eye drops daily  . Hepatitis B 1968   "tx'd w/isolation; caught it from toilet stools in gym"  . History of blood transfusion 03/01/2019  . History of colon polyps    benign  . History of gout    takes Allopurinol daily as well as Colchicine-if needed (02/28/2018)  . History of kidney stones   . HTN (hypertension)  takes Coreg,Imdur.and Apresoline daily  . Human immunodeficiency virus (HIV) disease ([place]) dx'd 1995   takes Genvoya daily  . Hyperlipidemia    takes Atorvastatin daily  . Ischemic cardiomyopathy   . Muscle spasm    takes Zanaflex as needed  . Myocardial infarction ([place]) ~ 2004/2005  . Nocturia   . Peripheral neuropathy    takes gabapentin daily  . Pneumonia    "at least twice" (02/28/2018)  . Shortness of breath dyspnea    rarely but if notices it then with exertion  .  Syphilis, unspecified   . Type II diabetes mellitus ([place]) 2004   Lantus daily.Average fasting blood sugar 125-199  . Wears glasses   . Wears partial dentures    Past Surgical History:  Procedure Laterality Date  . AV FISTULA PLACEMENT Left 08/02/2018   Procedure: ARTERIOVENOUS (AV) FISTULA CREATION  left arm radiocephlic;  Surgeon: Marty Heck, MD;  Location: [facility];  Service: Vascular;  Laterality: Left;  . BACK SURGERY    . CARDIAC CATHETERIZATION  10/2002; 12/19/2004   [name] 03/08/2011  . COLONOSCOPY  2013   Dr.John Henrene Pastor   . CORONARY ARTERY BYPASS GRAFT  02/24/2003   CABG X2/notes 03/08/2011  . ESOPHAGOGASTRODUODENOSCOPY (EGD) WITH PROPOFOL N/A 02/08/2019   Procedure: ESOPHAGOGASTRODUODENOSCOPY (EGD) WITH PROPOFOL;  Surgeon: Milus Banister, MD;  Location: [facility];  Service: Gastroenterology;  Laterality: N/A;  . FRACTURE SURGERY    . HOT HEMOSTASIS N/A 02/08/2019   Procedure: HOT HEMOSTASIS (ARGON PLASMA COAGULATION/BICAP);  Surgeon: Milus Banister, MD;  Location[facility] ENDOSCOPY;  Service: Gastroenterology;  Laterality: N/A;  . INTERTROCHANTERIC HIP FRACTURE SURGERY Left 11/2006   [name] 03/08/2011  . LAPAROSCOPIC CHOLECYSTECTOMY  05/2006  . LIGATION OF COMPETING BRANCHES OF ARTERIOVENOUS FISTULA Left 11/05/2018   Procedure: LIGATION OF COMPETING BRANCHES OF ARTERIOVENOUS FISTULA  LEFT  ARM;  Surgeon: Marty Heck, MD;  Location: [facility];  Service: Vascular;  Laterality: Left;  . LUMBAR LAMINECTOMY/DECOMPRESSION MICRODISCECTOMY N/A 02/29/2016   Procedure: Left L4-5 Lateral Recess Decompression, Removal Extradural Intraspinal Facet Cyst;  Surgeon: Marybelle Killings, MD;  Location: [facility];  Service: Orthopedics;  Laterality: N/A;  . MULTIPLE TOOTH EXTRACTIONS    . ORIF MANDIBULAR FRACTURE Left 08/13/2004   ORIF of left body fracture mandible with KLS Martin 2.3-mm six hole/notes 03/08/2011   Social History:   reports that he has been smoking cigarettes. He has a 43.00  pack-year smoking history. He has never used smokeless tobacco. He reports current alcohol use of about 12.0 standard drinks of alcohol per week. He reports that he does not use drugs.  Family History  Problem Relation Age of Onset  . Heart failure Father   . Hypertension Father   . Diabetes Brother   . Heart attack Brother   . Alzheimer's disease Mother   . Stroke Sister   . Diabetes Sister   . Alzheimer's disease Sister   . Hypertension Brother   . Diabetes Brother   . Drug abuse Brother   . Colon cancer Neg Hx     Medications: Patient's Medications  New Prescriptions   No medications on file  Previous Medications   ACETAMINOPHEN (TYLENOL) 325 MG TABLET    Take 975 mg by mouth every 6 (six) hours as needed.   ACETAMINOPHEN (TYLENOL) 500 MG TABLET    Take 500 mg by mouth every 6 (six) hours as needed.   ALLOPURINOL (ZYLOPRIM) 100 MG TABLET    Take 1 tablet (100 mg total)  by mouth daily.   ASPIRIN EC 81 MG TABLET    Take 81 mg by mouth. Every 3 days   ATORVASTATIN (LIPITOR) 80 MG TABLET    Take 1 tablet (80 mg total) by mouth daily.   B-D UF III MINI PEN NEEDLES 31G X 5 MM MISC    USE FOUR TIMES DAILY   BIKTARVY 50-200-25 MG TABS TABLET    TAKE 1 TABLET BY MOUTH DAILY   BLOOD GLUCOSE MONITORING SUPPL (ONE TOUCH ULTRA MINI) W/DEVICE KIT    1 each by Does not apply route 2 (two) times daily. Dx: E11.40   CARVEDILOL (COREG) 25 MG TABLET    Take 1 tablet (25 mg total) by mouth 2 (two) times daily with a meal.   CHOLECALCIFEROL (VITAMIN D3) 50 MCG (2000 UT) TABS    Take 2,000 Units by mouth daily.   CHOLINE FENOFIBRATE (FENOFIBRIC ACID) 135 MG CPDR    TAKE 1 CAPSULE BY MOUTH DAILY   COLCHICINE 0.6 MG TABLET    Take one tablet by mouth as needed for gout   DOCUSATE SODIUM (COLACE) 100 MG CAPSULE    Take 100 mg by mouth as needed for mild constipation.   FUROSEMIDE (LASIX) 80 MG TABLET    Take 160 mg by mouth daily.    GABAPENTIN (NEURONTIN) 300 MG CAPSULE    TAKE 1 CAPSULE BY MOUTH  THREE TIMES DAILY   GLUCOSAMINE-CHONDROIT-VIT C-MN (GLUCOSAMINE CHONDR 1500 COMPLX PO)    Take 2 tablets by mouth daily.    GLUCOSE BLOOD TEST STRIP    Use to test blood sugar three times daily. E11.40   HYDRALAZINE (APRESOLINE) 25 MG TABLET    TAKE 1 TABLET(25 MG) BY MOUTH THREE TIMES DAILY   INSULIN LISPRO (HUMALOG) 100 UNIT/ML KWIKPEN    INJECT 0.18MLS(18 UNITS TOTAL) INTO THE SKIN 3 TIMES DAILY   ISOSORBIDE MONONITRATE (IMDUR) 30 MG 24 HR TABLET    TAKE 1 TABLET(30 MG) BY MOUTH DAILY   LANCETS (ONETOUCH DELICA PLUS [id number]) MISC    1 each by Does not apply route 3 (three) times daily. Use to test blood sugar three times daily. Dx: E11.40   LANTUS SOLOSTAR 100 UNIT/ML SOLOSTAR PEN    ADMINISTER 74 UNITS UNDER THE SKIN EVERY EVENING   LATANOPROST (XALATAN) 0.005 % OPHTHALMIC SOLUTION    Place 1 drop into both eyes at bedtime. Reported on 04/04/2016   NITROGLYCERIN (NITROSTAT) 0.3 MG SL TABLET    ONE TABLET UNDER TONGUE AS NEEDED FOR CHEST PAIN   PANTOPRAZOLE (PROTONIX) 40 MG TABLET    Take 1 tablet (40 mg total) by mouth daily.   SODIUM BICARBONATE 650 MG TABLET    Take 650 mg by mouth 2 (two) times daily.    TIMOLOL (TIMOPTIC) 0.5 % OPHTHALMIC SOLUTION    Place 1 drop into both eyes every morning.   UMECLIDINIUM-VILANTEROL (ANORO ELLIPTA) 62.5-25 MCG/INH AEPB    INHALE 1 PUFF BY MOUTH EVERY DAY  Modified Medications   No medications on file  Discontinued Medications   DOCUSATE SODIUM (COLACE) 100 MG CAPSULE    Take 1 capsule (100 mg total) by mouth daily.    Physical Exam:  Vitals:   06/04/19 0933  BP: (!) 144/80  Pulse: 79  Temp: 97.6 F (36.4 C)  TempSrc: Oral  SpO2: 98%  Weight: 205 lb 12.8 oz (93.4 kg)  Height: '6\' 2"'  (1.88 m)   Body mass index is 26.42 kg/m. Wt Readings from Last 3 Encounters:  06/04/19 205  lb 12.8 oz (93.4 kg)  05/01/19 200 lb (90.7 kg)  03/01/19 200 lb 4 oz (90.8 kg)    Physical Exam Constitutional:      Appearance: He is well-developed.  Eyes:      General: No scleral icterus.    Pupils: Pupils are equal, round, and reactive to light.  Neck:     Musculoskeletal: Neck supple.     Thyroid: No thyromegaly.     Vascular: No carotid bruit.  Cardiovascular:     Rate and Rhythm: Normal rate and regular rhythm.     Heart sounds: Murmur present. No friction rub. No gallop.   Pulmonary:     Effort: Pulmonary effort is normal.     Breath sounds: Normal breath sounds. No wheezing or rales.  Abdominal:     General: Bowel sounds are normal. There is no abdominal bruit.     Palpations: Abdomen is soft. There is no hepatomegaly or pulsatile mass.  Lymphadenopathy:     Cervical: No cervical adenopathy.  Skin:    General: Skin is warm and dry.     Findings: No rash.  Neurological:     Mental Status: He is alert and oriented to person, place, and time.     Deep Tendon Reflexes: Reflexes are normal and symmetric.  Psychiatric:        Behavior: Behavior normal.        Thought Content: Thought content normal.        Judgment: Judgment normal.    Labs reviewed: Basic Metabolic Panel: Recent Labs    02/06/19 1045 02/07/19 0642 02/07/19 1956 02/09/19 0518 03/08/19  NA 138 140  --  139 142  K 4.5 4.5 4.4 4.7 4.5  CL 108 107  --  108  --   CO2 17* 19*  --  20*  --   GLUCOSE 263* 147*  --  163*  --   BUN 99* 98*  --  91* 70*  CREATININE 5.74* 5.63*  --  5.08* 5.2*  CALCIUM 8.8* 9.1  --  8.9  --    Liver Function Tests: Recent Labs    11/29/18 0933 02/06/19 1045 02/07/19 0642  AST '30 25 26  ' ALT '20 20 21  ' ALKPHOS  --  36* 40  BILITOT 0.4 0.5 0.9  PROT 7.9 7.1 7.6  ALBUMIN  --  3.3* 3.4*   Recent Labs    02/06/19 1045  LIPASE 110*   No results for input(s): AMMONIA in the last 8760 hours. CBC: Recent Labs    02/06/19 1045  02/07/19 0642 02/08/19 0509 02/09/19 0518  03/08/19  04/12/19 0857 04/25/19 0954 05/09/19 0952  WBC 5.8  --  6.8 7.0 5.9  --  6.1  --   --   --   --   NEUTROABS 2.7  --   --   --   --   --    --   --   --   --   --   HGB 6.4*   < > 8.2* 8.0* 8.1*   < > 10.3*   < > 13.3 13.8 13.8  HCT 20.3*   < > 26.0* 26.1* 25.8*  --  32*  --   --   --   --   MCV 96.2  --  91.9 94.9 93.5  --   --   --   --   --   --   PLT 199  --  201 195 211  --  213  --   --   --   --    < > = values in this interval not displayed.   Lipid Panel: Recent Labs    11/29/18 0933 03/08/19  CHOL 183 115  HDL 33* 18*  LDLCALC 114*  --   TRIG 240* 674*  CHOLHDL 5.5*  --    TSH: No results for input(s): TSH in the last 8760 hours. A1C: Lab Results  Component Value Date   HGBA1C 7.3 (H) 02/08/2019     Assessment/Plan 1. Diabetic ulcer of right heel associated with type 2 diabetes mellitus, unspecified ulcer stage ([place]) Improved without current ulcers, followed closely by podiatrist   2. Hyperlipidemia associated with type 2 diabetes mellitus ([place]) Triglycerides elevated in May, will follow up today. Discussed importance of proper dietary modifications, continues on lipitor 80 mg daily  - Lipid Panel  3. Chronic obstructive pulmonary disease, unspecified COPD type (H[facility]) Stable, continues on anoro daily without exacerbation   4. Essential hypertension Slightly elevated today, encouraged dietary modifications.   5. Coronary artery disease involving native coronary artery of native heart without angina pectoris Stable, without chest pains. Continues ASA 81 mg daily  6. Gout, unspecified cause, unspecified chronicity, unspecified site No recent gout level, no recent flares, will continue allopurinol 100 mg daily  -uric acid  7. CKD stage 4 due to type 2 diabetes mellitus ([place]) Continues to follow up with nephrologist, currently not on HD.  8. Type 2 diabetes mellitus with diabetic neuropathy, with long-term current use of insulin ([place]) Not controlled. Pt needs to make dietary modifications and discuss with him in detail about this. Offered diabetic education with a nutritionist but he declined at  this time.  -Encouraged dietary compliance, routine foot care/monitoring and to keep up with diabetic eye exams through ophthalmology  -will change medication to toujeo max (and increase him to 80 units consistently) due to the amount of insulin he is requiring.  -educated to monitor for hypoglycemia  - Insulin Glargine, 2 Unit Dial, (TOUJEO MAX SOLOSTAR) 300 UNIT/ML SOPN; Inject 80 Units into the skin daily.  Dispense: 3 mL; Refill: 12 - Hemoglobin A1c - insulin lispro (HUMALOG) 100 UNIT/ML KwikPen; Inject 0.2 mLs (20 Units total) into the skin 3 (three) times daily.  Dispense: 15 pen; Refill: 1  Next appt: 2 weeks to follow up on diabetes.  [name]. [place], [facility] [phone]

## 2019-06-04 NOTE — Patient Instructions (Addendum)
To start toujeo Max 80 units once you have completed lantus  To follow up blood sugars 2 weeks after you have started toujeo  Continue to work on diet modifications Be mindful of everything you eat- all sugar adds up    Diabetes Mellitus and Nutrition, Adult When you have diabetes (diabetes mellitus), it is very important to have healthy eating habits because your blood sugar (glucose) levels are greatly affected by what you eat and drink. Eating healthy foods in the appropriate amounts, at about the same times every day, can help you:  Control your blood glucose.  Lower your risk of heart disease.  Improve your blood pressure.  Reach or maintain a healthy weight. Every person with diabetes is different, and each person has different needs for a meal plan. Your health care provider may recommend that you work with a diet and nutrition specialist (dietitian) to make a meal plan that is best for you. Your meal plan may vary depending on factors such as:  The calories you need.  The medicines you take.  Your weight.  Your blood glucose, blood pressure, and cholesterol levels.  Your activity level.  Other health conditions you have, such as heart or kidney disease. How do carbohydrates affect me? Carbohydrates, also called carbs, affect your blood glucose level more than any other type of food. Eating carbs naturally raises the amount of glucose in your blood. Carb counting is a method for keeping track of how many carbs you eat. Counting carbs is important to keep your blood glucose at a healthy level, especially if you use insulin or take certain oral diabetes medicines. It is important to know how many carbs you can safely have in each meal. This is different for every person. Your dietitian can help you calculate how many carbs you should have at each meal and for each snack. Foods that contain carbs include:  Bread, cereal, rice, pasta, and crackers.  Potatoes and  corn.  Peas, beans, and lentils.  Milk and yogurt.  Fruit and juice.  Desserts, such as cakes, cookies, ice cream, and candy. How does alcohol affect me? Alcohol can cause a sudden decrease in blood glucose (hypoglycemia), especially if you use insulin or take certain oral diabetes medicines. Hypoglycemia can be a life-threatening condition. Symptoms of hypoglycemia (sleepiness, dizziness, and confusion) are similar to symptoms of having too much alcohol. If your health care provider says that alcohol is safe for you, follow these guidelines:  Limit alcohol intake to no more than 1 drink per day for nonpregnant women and 2 drinks per day for men. One drink equals 12 oz of beer, 5 oz of wine, or 1 oz of hard liquor.  Do not drink on an empty stomach.  Keep yourself hydrated with water, diet soda, or unsweetened iced tea.  Keep in mind that regular soda, juice, and other mixers may contain a lot of sugar and must be counted as carbs. What are tips for following this plan?  Reading food labels  Start by checking the serving size on the "Nutrition Facts" label of packaged foods and drinks. The amount of calories, carbs, fats, and other nutrients listed on the label is based on one serving of the item. Many items contain more than one serving per package.  Check the total grams (g) of carbs in one serving. You can calculate the number of servings of carbs in one serving by dividing the total carbs by 15. For example, if a food has 30  g of total carbs, it would be equal to 2 servings of carbs.  Check the number of grams (g) of saturated and trans fats in one serving. Choose foods that have low or no amount of these fats.  Check the number of milligrams (mg) of salt (sodium) in one serving. Most people should limit total sodium intake to less than 2,300 mg per day.  Always check the nutrition information of foods labeled as "low-fat" or "nonfat". These foods may be higher in added sugar or  refined carbs and should be avoided.  Talk to your dietitian to identify your daily goals for nutrients listed on the label. Shopping  Avoid buying canned, premade, or processed foods. These foods tend to be high in fat, sodium, and added sugar.  Shop around the outside edge of the grocery store. This includes fresh fruits and vegetables, bulk grains, fresh meats, and fresh dairy. Cooking  Use low-heat cooking methods, such as baking, instead of high-heat cooking methods like deep frying.  Cook using healthy oils, such as olive, canola, or sunflower oil.  Avoid cooking with butter, cream, or high-fat meats. Meal planning  Eat meals and snacks regularly, preferably at the same times every day. Avoid going long periods of time without eating.  Eat foods high in fiber, such as fresh fruits, vegetables, beans, and whole grains. Talk to your dietitian about how many servings of carbs you can eat at each meal.  Eat 4-6 ounces (oz) of lean protein each day, such as lean meat, chicken, fish, eggs, or tofu. One oz of lean protein is equal to: ? 1 oz of meat, chicken, or fish. ? 1 egg. ?  cup of tofu.  Eat some foods each day that contain healthy fats, such as avocado, nuts, seeds, and fish. Lifestyle  Check your blood glucose regularly.  Exercise regularly as told by your health care provider. This may include: ? 150 minutes of moderate-intensity or vigorous-intensity exercise each week. This could be brisk walking, biking, or water aerobics. ? Stretching and doing strength exercises, such as yoga or weightlifting, at least 2 times a week.  Take medicines as told by your health care provider.  Do not use any products that contain nicotine or tobacco, such as cigarettes and e-cigarettes. If you need help quitting, ask your health care provider.  Work with a Social worker or diabetes educator to identify strategies to manage stress and any emotional and social challenges. Questions to ask a  health care provider  Do I need to meet with a diabetes educator?  Do I need to meet with a dietitian?  What number can I call if I have questions?  When are the best times to check my blood glucose? Where to find more information:  American Diabetes Association: diabetes.org  Academy of Nutrition and Dietetics: www.eatright.[employer] of Diabetes and Digestive and Kidney Diseases (NIH): [employer] Summary  A healthy meal plan will help you control your blood glucose and maintain a healthy lifestyle.  Working with a diet and nutrition specialist (dietitian) can help you make a meal plan that is best for you.  Keep in mind that carbohydrates (carbs) and alcohol have immediate effects on your blood glucose levels. It is important to count carbs and to use alcohol carefully. This information is not intended to replace advice given to you by your health care provider. Make sure you discuss any questions you have with your health care provider. Document Released: 07/07/2005 Document Revised: 09/22/2017 Document Reviewed:  11/14/2016 Elsevier Patient Education  [place].

## 2019-06-05 LAB — TEST AUTHORIZATION

## 2019-06-05 LAB — LIPID PANEL
Cholesterol: 158 mg/dL (ref <200)
HDL: 19 mg/dL — ABNORMAL LOW (ref >=40)
Non-HDL Cholesterol (Calc): 139 mg/dL (calc) — ABNORMAL HIGH (ref <130)
Total CHOL/HDL Ratio: 8.3 (calc) — ABNORMAL HIGH (ref <5.0)
Triglycerides: 574 mg/dL — ABNORMAL HIGH (ref <150)

## 2019-06-05 LAB — HEMOGLOBIN A1C
Hgb A1c MFr Bld: 9.2 % of total Hgb — ABNORMAL HIGH (ref <5.7)
Mean Plasma Glucose: 217 (calc)
eAG (mmol/L): 12 (calc)

## 2019-06-05 LAB — SEDIMENTATION RATE: Sed Rate: 29 mm/h — ABNORMAL HIGH (ref 0–20)

## 2019-06-06 ENCOUNTER — Encounter (HOSPITAL_COMMUNITY): Payer: Medicare Other

## 2019-06-06 NOTE — Addendum Note (Signed)
Addended by: [name] on: 06/06/2019 04:34 PM   Modules accepted: Orders

## 2019-06-07 NOTE — Progress Notes (Signed)
Cardiology Office Note   Date:  06/14/2019   ID:  Jacob [name], DOB [birth year], MRN [id number]  PCP:  [name], NP  Cardiologist:  Dr. [name]    No chief complaint on file.     History of Present Illness:  68 y.o. with distant CABG in 2004 with EF improving from 24% to 45-50% after revascularization. Last myovue 2016 with scar no ischemia. Has done well with medical Rx and cath avoided due to CRF Now on dialysis  Followed by Dr [name] Had revision of left arm fistula January 2019  Comorbid conditions include HIV previous drug abuse HTN and HLD  No angina.   Echo 02/07/19 EF 40-45% Severe LAE no significant valve disease   Myovue 12/13/18 abnormal EF 26% large inferior wall infarct   Cr 5.2 03/08/19  Hct 25.8 transfused 02/11/19  EGD AVM;s Rx did not have colonoscopy Iron Rx given   Needs dialysis Fistula placed August 02 2018 Dr Carlis Abbott   Dr [name] has increased his lasix Still anemia with dyspnea Getting iron infusions.  BS poorly controlled eating too much bread and popsicles  A1c 9.2 Triglycerides 574  Drinking to much [place] wine    Past Medical History:  Diagnosis Date  . Acute respiratory failure ([place]) 03/01/2018  . Arthritis    "all over; mostly knees and back" (02/28/2018)  . CHF (congestive heart failure) (H[facility])    not on any meds  . Chronic lower back pain    stenosis  . CKD (chronic kidney disease), stage V ([place])    [name] 02/28/2018  . Community acquired pneumonia 09/06/2013  . Coronary atherosclerosis of native coronary artery 2005   s/p surgery  . Drug abuse ([place])    hx; tested for cocaine as recently as 2/08. says he is not using drugs now - avoided defib. for this reason   . GERD (gastroesophageal reflux disease)    takes OTC meds as needed  . GI bleeding 02/06/2019  . Glaucoma    uses eye drops daily  . Hepatitis B 1968   "tx'd w/isolation; caught it from toilet stools in gym"  . History of blood transfusion 03/01/2019  . History of colon  polyps    benign  . History of gout    takes Allopurinol daily as well as Colchicine-if needed (02/28/2018)  . History of kidney stones   . HTN (hypertension)    takes Coreg,Imdur.and Apresoline daily  . Human immunodeficiency virus (HIV) disease ([place]) dx'd 1995   takes Genvoya daily  . Hyperlipidemia    takes Atorvastatin daily  . Ischemic cardiomyopathy   . Muscle spasm    takes Zanaflex as needed  . Myocardial infarction ([place]) ~ 2004/2005  . Nocturia   . Peripheral neuropathy    takes gabapentin daily  . Pneumonia    "at least twice" (02/28/2018)  . Shortness of breath dyspnea    rarely but if notices it then with exertion  . Syphilis, unspecified   . Type II diabetes mellitus ([place]) 2004   Lantus daily.Average fasting blood sugar 125-199  . Wears glasses   . Wears partial dentures     Past Surgical History:  Procedure Laterality Date  . AV FISTULA PLACEMENT Left 08/02/2018   Procedure: ARTERIOVENOUS (AV) FISTULA CREATION  left arm radiocephlic;  Surgeon: Marty Heck, MD;  Location: [facility];  Service: Vascular;  Laterality: Left;  . BACK SURGERY    . CARDIAC CATHETERIZATION  10/2002; 12/19/2004   [name] 03/08/2011  .  COLONOSCOPY  2013   Dr.John Henrene Pastor   . CORONARY ARTERY BYPASS GRAFT  02/24/2003   CABG X2/notes 03/08/2011  . ESOPHAGOGASTRODUODENOSCOPY (EGD) WITH PROPOFOL N/A 02/08/2019   Procedure: ESOPHAGOGASTRODUODENOSCOPY (EGD) WITH PROPOFOL;  Surgeon: Milus Banister, MD;  Location: [facility];  Service: Gastroenterology;  Laterality: N/A;  . FRACTURE SURGERY    . HOT HEMOSTASIS N/A 02/08/2019   Procedure: HOT HEMOSTASIS (ARGON PLASMA COAGULATION/BICAP);  Surgeon: Milus Banister, MD;  [facility] ENDOSCOPY;  Service: Gastroenterology;  Laterality: N/A;  . INTERTROCHANTERIC HIP FRACTURE SURGERY Left 11/2006   [name] 03/08/2011  . LAPAROSCOPIC CHOLECYSTECTOMY  05/2006  . LIGATION OF COMPETING BRANCHES OF ARTERIOVENOUS FISTULA Left 11/05/2018   Procedure: LIGATION  OF COMPETING BRANCHES OF ARTERIOVENOUS FISTULA  LEFT  ARM;  Surgeon: Marty Heck, MD;  Location: [facility];  Service: Vascular;  Laterality: Left;  . LUMBAR LAMINECTOMY/DECOMPRESSION MICRODISCECTOMY N/A 02/29/2016   Procedure: Left L4-5 Lateral Recess Decompression, Removal Extradural Intraspinal Facet Cyst;  Surgeon: Marybelle Killings, MD;  Location: [facility];  Service: Orthopedics;  Laterality: N/A;  . MULTIPLE TOOTH EXTRACTIONS    . ORIF MANDIBULAR FRACTURE Left 08/13/2004   ORIF of left body fracture mandible with KLS Martin 2.3-mm six hole/notes 03/08/2011     Current Outpatient Medications  Medication Sig Dispense Refill  . acetaminophen (TYLENOL) 325 MG tablet Take 975 mg by mouth every 6 (six) hours as needed.    [name] allopurinol (ZYLOPRIM) 100 MG tablet Take 1 tablet (100 mg total) by mouth daily. 90 tablet 1  . aspirin EC 81 MG tablet Take 81 mg by mouth. Every 3 days    . atorvastatin (LIPITOR) 80 MG tablet Take 1 tablet (80 mg total) by mouth daily. 90 tablet 3  . B-D UF III MINI PEN NEEDLES 31G X 5 MM MISC USE FOUR TIMES DAILY 100 each 11  . BIKTARVY 50-200-25 MG TABS tablet TAKE 1 TABLET BY MOUTH DAILY 30 tablet 3  . Blood Glucose Monitoring Suppl (ONE TOUCH ULTRA MINI) w/Device KIT 1 each by Does not apply route 2 (two) times daily. Dx: E11.40 1 each 0  . calcitRIOL (ROCALTROL) 0.25 MCG capsule Take 1 capsule by mouth daily.    . carvedilol (COREG) 25 MG tablet Take 1 tablet (25 mg total) by mouth 2 (two) times daily with a meal. 180 tablet 3  . Cholecalciferol (VITAMIN D3) 50 MCG (2000 UT) TABS Take 2,000 Units by mouth daily.    . Choline Fenofibrate (FENOFIBRIC ACID) 135 MG CPDR TAKE 1 CAPSULE BY MOUTH DAILY 90 capsule 1  . colchicine 0.6 MG tablet Take one tablet by mouth as needed for gout 30 tablet 2  . docusate sodium (COLACE) 100 MG capsule Take 100 mg by mouth as needed for mild constipation.    . furosemide (LASIX) 80 MG tablet Take 160 mg by mouth daily.     [name] gabapentin  (NEURONTIN) 300 MG capsule TAKE 1 CAPSULE BY MOUTH THREE TIMES DAILY 270 capsule 1  . Glucosamine-Chondroit-Vit C-Mn (GLUCOSAMINE CHONDR 1500 COMPLX PO) Take 2 tablets by mouth daily.     [name] glucose blood test strip Use to test blood sugar three times daily. E11.40 300 each 3  . hydrALAZINE (APRESOLINE) 25 MG tablet TAKE 1 TABLET(25 MG) BY MOUTH THREE TIMES DAILY 270 tablet 1  . Insulin Glargine, 2 Unit Dial, (TOUJEO MAX SOLOSTAR) 300 UNIT/ML SOPN Inject 80 Units into the skin daily. 3 mL 12  . insulin lispro (HUMALOG) 100 UNIT/ML KwikPen Inject  0.2 mLs (20 Units total) into the skin 3 (three) times daily. 15 pen 1  . isosorbide mononitrate (IMDUR) 30 MG 24 hr tablet TAKE 1 TABLET(30 MG) BY MOUTH DAILY 90 tablet 0  . Lancets (ONETOUCH DELICA PLUS [id number]) MISC 1 each by Does not apply route 3 (three) times daily. Use to test blood sugar three times daily. Dx: E11.40 300 each 3  . latanoprost (XALATAN) 0.005 % ophthalmic solution Place 1 drop into both eyes at bedtime. Reported on 04/04/2016    . nitroGLYCERIN (NITROSTAT) 0.3 MG SL tablet ONE TABLET UNDER TONGUE AS NEEDED FOR CHEST PAIN 25 tablet 4  . pantoprazole (PROTONIX) 40 MG tablet Take 1 tablet (40 mg total) by mouth daily. 90 tablet 1  . sodium bicarbonate 650 MG tablet Take 650 mg by mouth 2 (two) times daily.   3  . timolol (TIMOPTIC) 0.5 % ophthalmic solution Place 1 drop into both eyes every morning.    . umeclidinium-vilanterol (ANORO ELLIPTA) 62.5-25 MCG/INH AEPB INHALE 1 PUFF BY MOUTH EVERY DAY 60 each 5   No current facility-administered medications for this visit.     Allergies:   Augmentin [amoxicillin-pot clavulanate] and Amphetamines    Social History:  The patient  reports that he has been smoking cigarettes. He has a 43.00 pack-year smoking history. He has never used smokeless tobacco. He reports current alcohol use of about 12.0 standard drinks of alcohol per week. He reports that he does not use drugs.   Family History:   The patient's family history includes Alzheimer's disease in his mother and sister; Diabetes in his brother, brother, and sister; Drug abuse in his brother; Heart attack in his brother; Heart failure in his father; Hypertension in his brother and father; Stroke in his sister.    ROS:  General:no colds or fevers,  weight down 3 lbs by our scales but at home back to baseline Skin:no rashes or ulcers HEENT:no blurred vision, no congestion CV:see HPI PUL:see HPI GI:no diarrhea constipation or melena, no indigestion GU:no hematuria, no dysuria MS:no joint pain, no claudication Neuro:no syncope, no lightheadedness Endo:+ diabetes glucose stable, no thyroid disease  Wt Readings from Last 3 Encounters:  06/14/19 205 lb (93 kg)  06/04/19 205 lb 12.8 oz (93.4 kg)  05/01/19 200 lb (90.7 kg)     PHYSICAL EXAM: VS:  BP 120/80   Pulse 76   Ht 6' 2" (1.88 m)   Wt 205 lb (93 kg)   SpO2 98%   BMI 26.32 kg/m  , BMI Body mass index is 26.32 kg/m. Affect appropriate Chronically ill black male  HEENT: normal Neck supple with no adenopathy JVP normal no bruits no thyromegaly Lungs clear with no wheezing and good diaphragmatic motion Heart:  S1/S2 no murmur, no rub, gallop or click PMI normal Abdomen: benighn, BS positve, no tenderness, no AAA no bruit.  No HSM or HJR Distal pulses intact with no bruits No edema Neuro non-focal Skin warm and dry No muscular weakness Left arm fistual from wrist to brachial thrill somewhat poor    EKG:   10/21/17 SR rate 77 PVC possible old IMI lateral T wave inversion    Recent Labs: 02/06/2019: B Natriuretic Peptide 852.1 02/07/2019: ALT 21 03/08/2019: BUN 70; BUN 70; Creatinine 5.2; Creatinine 5.2; Platelets 213; Platelets 19; Potassium 4.5; Potassium 4.5; Sodium 142; Sodium 142 05/09/2019: Hemoglobin 13.8    Lipid Panel    Component Value Date/Time   CHOL 158 06/04/2019 1019   CHOL 121 08/07/2015  1041   TRIG 574 (H) 06/04/2019 1019   HDL 19 (L)  06/04/2019 1019   HDL 32 (L) 08/07/2015 1041   CHOLHDL 8.3 (H) 06/04/2019 1019   VLDL 48 (H) 03/15/2017 0924   LDLCALC  06/04/2019 1019     Comment:     . LDL cholesterol not calculated. Triglyceride levels greater than 400 mg/dL invalidate calculated LDL results. . Reference range: <100 . Desirable range <100 mg/dL for primary prevention;   <70 mg/dL for patients with CHD or diabetic patients  with > or = 2 CHD risk factors. [name] LDL-C is now calculated using the Martin-Hopkins  calculation, which is a validated novel method providing  better accuracy than the Friedewald equation in the  estimation of LDL-C.  Cresenciano Genre et al. Annamaria Helling. 9563;875(64): 2061-2068  (http://education.[employer].com/faq/FAQ164)        Other studies Reviewed: Additional studies/ records that were reviewed today include: recent OV note.  ECHO 11/10/2016 ------------------------------------------------------------------- Study Conclusions  - Left ventricle: Septal and inferior wall hypokinesis The cavity   size was mildly dilated. There was moderate concentric   hypertrophy. Systolic function was mildly reduced. The estimated   ejection fraction was in the range of 45% to 50%. Doppler   parameters are consistent with both elevated ventricular   end-diastolic filling pressure and elevated left atrial filling   pressure. - Left atrium: The atrium was mildly dilated. - Atrial septum: No defect or patent foramen ovale was identified.  ASSESSMENT AND PLAN:  1.  Acute on chronic systolic and diastolic HF this is related to CRF  EF 40-45% echo April 2020  His diuretics have been increased by nephrology Our hands are tied in regard to doing right and left cath until he is on dialysis He is at high risk for worsening CHF  2. CAD no angina, Distant CABG 2004.  Myovue 12/13/18 high risk due to EF 26% large inferior wall infarct Troponin elevated recent hospitalization ? Demand with Hb 6 and fistula with  higher output Cath delayed until dialysis started   3. DM:  Discussed low carb diet.  Target hemoglobin A1c is 6.5 or less.  Continue current medications.  4. CRF:  Had revision of fistula  11/05/18 f/u nephrology and [facility] Dr Carlis Abbott I think he is at high risk for recurrent CHF f/u [place] lasix dose increased Baseline Cr 5.2   5. Cholesterol:  On statin labs with primary Triglycerides  up due to poor BS control   6. HTN:  Well controlled.  Continue current medications and low sodium Dash type diet.    F/U in 3 months   Jenkins Rouge, MD

## 2019-06-14 ENCOUNTER — Other Ambulatory Visit: Payer: Self-pay

## 2019-06-14 ENCOUNTER — Encounter: Payer: Self-pay | Admitting: Cardiovascular Disease

## 2019-06-14 ENCOUNTER — Ambulatory Visit (INDEPENDENT_AMBULATORY_CARE_PROVIDER_SITE_OTHER): Payer: Medicare Other | Admitting: Cardiovascular Disease

## 2019-06-14 VITALS — BP 120/80 | HR 76 | Ht 74.0 in | Wt 205.0 lb

## 2019-06-14 DIAGNOSIS — I2510 Atherosclerotic heart disease of native coronary artery without angina pectoris: Secondary | ICD-10-CM | POA: Diagnosis not present

## 2019-06-14 NOTE — Patient Instructions (Signed)
Your physician recommends that you continue on your current medications as directed. Please refer to the Current Medication list given to you today.  Your physician wants you to follow-up in: YEAR WITH DR NISHAN You will receive a reminder letter in the mail two months in advance. If you don't receive a letter, please call our office to schedule the follow-up appointment.  

## 2019-06-17 ENCOUNTER — Encounter: Payer: Self-pay | Admitting: Internal Medicine

## 2019-06-18 ENCOUNTER — Telehealth: Payer: Self-pay

## 2019-06-18 ENCOUNTER — Encounter: Payer: Self-pay | Admitting: Nurse Practitioner

## 2019-06-18 ENCOUNTER — Other Ambulatory Visit: Payer: Self-pay | Admitting: *Deleted

## 2019-06-18 ENCOUNTER — Other Ambulatory Visit: Payer: Self-pay

## 2019-06-18 ENCOUNTER — Ambulatory Visit (INDEPENDENT_AMBULATORY_CARE_PROVIDER_SITE_OTHER): Payer: Medicare Other | Admitting: Nurse Practitioner

## 2019-06-18 DIAGNOSIS — E1140 Type 2 diabetes mellitus with diabetic neuropathy, unspecified: Secondary | ICD-10-CM

## 2019-06-18 DIAGNOSIS — Z794 Long term (current) use of insulin: Secondary | ICD-10-CM

## 2019-06-18 DIAGNOSIS — M109 Gout, unspecified: Secondary | ICD-10-CM

## 2019-06-18 DIAGNOSIS — E785 Hyperlipidemia, unspecified: Secondary | ICD-10-CM

## 2019-06-18 DIAGNOSIS — E1169 Type 2 diabetes mellitus with other specified complication: Secondary | ICD-10-CM

## 2019-06-18 DIAGNOSIS — K649 Unspecified hemorrhoids: Secondary | ICD-10-CM

## 2019-06-18 MED ORDER — TOUJEO MAX SOLOSTAR 300 UNIT/ML ~~LOC~~ SOPN: 80.0000 [IU] | PEN_INJECTOR | Freq: Every day | SUBCUTANEOUS | 6 refills | Status: DC

## 2019-06-18 NOTE — Progress Notes (Signed)
This service is provided via telemedicine  No vital signs collected/recorded due to the encounter was a telemedicine visit.   Location of patient (ex: home, work):  Home  Patient consents to a telephone visit:  Yes  Location of the provider (ex: office, home): [facility], Office   Name of any referring provider:  N/A  Names of all persons participating in the telemedicine service and their role in the encounter:  S.Chrae B/CMA, Sherrie Mustache, NP, and Patient   Time spent on call: 5 min with medical assistant      Careteam: Patient Care Team: [name], NP as PCP - General (Geriatric Medicine) [name], MD as PCP - Infectious Diseases (Infectious Diseases) [name], MD as Consulting Physician (Ophthalmology) [name], MD as Consulting Physician (Cardiology) [name], MD as Consulting Physician (Infectious Diseases) Gardiner Barefoot, DPM as Consulting Physician (Podiatry) Madelon Lips, MD as Consulting Physician (Nephrology)  Advanced Directive information    Allergies  Allergen Reactions  . Augmentin [Amoxicillin-Pot Clavulanate] Diarrhea    Severe diarrhea   . Amphetamines Other (See Comments)    Unknown reaction type    Chief Complaint  Patient presents with  . Follow-up    2 week follow-up on blood sugar. Telephone visit   . Labs Only    Discuss Uric Acid, was to be added on to 06/04/2019 labs  . Blood In Stools    Patient c/o blood with bowel movements and questions if he can have labs to f/u on anemia on 06/28/2019 appointment.      HPI: Patient is a 68 y.o. male following up diabetes.   DM-A1C NOT controlled, started toujeo 80 unit daily. Blood sugars in the morning- 186, 169, 209, 215 Evening - 246,157, 289, 198, 175 Eating more salmon, fish, tuna.  Cutting back to 1-2 popsicles a day vs 3-4 and eating sugar free Drinking water. This morning - Had a piece of pound cake, Fruit cocktail and tuna and  "fast fuel" which is slim jim. He was agreeable to meet with a nutritionist which is scheduled for next month.   125/68 this morning 72 pulse  Occasionally when he wipes there is a spot or 2 of blood. Nothing like it was before, questions hemorrhoids because he strains to have a BM (going frequently due to ESRD). No pain, discomfort.   Review of Systems:  Review of Systems  Constitutional: Negative for chills and fever.  Eyes: Negative for blurred vision.  Respiratory: Negative for cough, sputum production and shortness of breath.   Cardiovascular: Negative for chest pain, palpitations and leg swelling.  Gastrointestinal: Positive for diarrhea. Negative for abdominal pain, blood in stool and constipation.  Genitourinary: Negative for dysuria, frequency and urgency.  Musculoskeletal: Negative for joint pain and myalgias.  Neurological: Positive for tingling and sensory change. Negative for dizziness and headaches.       Neuropathy to LE due to diabetes  Psychiatric/Behavioral: Negative for depression. The patient is not nervous/anxious.     Past Medical History:  Diagnosis Date  . Acute respiratory failure ([place]) 03/01/2018  . Arthritis    "all over; mostly knees and back" (02/28/2018)  . CHF (congestive heart failure) (HCC)    not on any meds  . Chronic lower back pain    stenosis  . CKD (chronic kidney disease), stage V ([place])    [name] 02/28/2018  . Community acquired pneumonia 09/06/2013  . Coronary atherosclerosis of native coronary artery 2005   s/p  surgery  . Drug abuse ([place])    hx; tested for cocaine as recently as 2/08. says he is not using drugs now - avoided defib. for this reason   . GERD (gastroesophageal reflux disease)    takes OTC meds as needed  . GI bleeding 02/06/2019  . Glaucoma    uses eye drops daily  . Hepatitis B 1968   "tx'd w/isolation; caught it from toilet stools in gym"  . History of blood transfusion 03/01/2019  . History of colon polyps    benign   . History of gout    takes Allopurinol daily as well as Colchicine-if needed (02/28/2018)  . History of kidney stones   . HTN (hypertension)    takes Coreg,Imdur.and Apresoline daily  . Human immunodeficiency virus (HIV) disease ([place]) dx'd 1995   takes Genvoya daily  . Hyperlipidemia    takes Atorvastatin daily  . Ischemic cardiomyopathy   . Muscle spasm    takes Zanaflex as needed  . Myocardial infarction ([place]) ~ 2004/2005  . Nocturia   . Peripheral neuropathy    takes gabapentin daily  . Pneumonia    "at least twice" (02/28/2018)  . Shortness of breath dyspnea    rarely but if notices it then with exertion  . Syphilis, unspecified   . Type II diabetes mellitus ([place]) 2004   Lantus daily.Average fasting blood sugar 125-199  . Wears glasses   . Wears partial dentures    Past Surgical History:  Procedure Laterality Date  . AV FISTULA PLACEMENT Left 08/02/2018   Procedure: ARTERIOVENOUS (AV) FISTULA CREATION  left arm radiocephlic;  Surgeon: Marty Heck, MD;  Location: [place];  Service: Vascular;  Laterality: Left;  . BACK SURGERY    . CARDIAC CATHETERIZATION  10/2002; 12/19/2004   [name] 03/08/2011  . COLONOSCOPY  2013   Dr.John Henrene Pastor   . CORONARY ARTERY BYPASS GRAFT  02/24/2003   CABG X2/notes 03/08/2011  . ESOPHAGOGASTRODUODENOSCOPY (EGD) WITH PROPOFOL N/A 02/08/2019   Procedure: ESOPHAGOGASTRODUODENOSCOPY (EGD) WITH PROPOFOL;  Surgeon: Milus Banister, MD;  Location: [place];  Service: Gastroenterology;  Laterality: N/A;  . FRACTURE SURGERY    . HOT HEMOSTASIS N/A 02/08/2019   Procedure: HOT HEMOSTASIS (ARGON PLASMA COAGULATION/BICAP);  Surgeon: Milus Banister, MD;  Location[facility];  Service: Gastroenterology;  Laterality: N/A;  . INTERTROCHANTERIC HIP FRACTURE SURGERY Left 11/2006   [name] 03/08/2011  . LAPAROSCOPIC CHOLECYSTECTOMY  05/2006  . LIGATION OF COMPETING BRANCHES OF ARTERIOVENOUS FISTULA Left 11/05/2018   Procedure: LIGATION OF COMPETING  BRANCHES OF ARTERIOVENOUS FISTULA  LEFT  ARM;  Surgeon: Marty Heck, MD;  Location: [place];  Service: Vascular;  Laterality: Left;  . LUMBAR LAMINECTOMY/DECOMPRESSION MICRODISCECTOMY N/A 02/29/2016   Procedure: Left L4-5 Lateral Recess Decompression, Removal Extradural Intraspinal Facet Cyst;  Surgeon: Marybelle Killings, MD;  Location: [place];  Service: Orthopedics;  Laterality: N/A;  . MULTIPLE TOOTH EXTRACTIONS    . ORIF MANDIBULAR FRACTURE Left 08/13/2004   ORIF of left body fracture mandible with KLS Martin 2.3-mm six hole/notes 03/08/2011   Social History:   reports that he has been smoking cigarettes. He has a 43.00 pack-year smoking history. He has never used smokeless tobacco. He reports current alcohol use of about 12.0 standard drinks of alcohol per week. He reports that he does not use drugs.  Family History  Problem Relation Age of Onset  . Heart failure Father   . Hypertension Father   . Diabetes Brother   . Heart  attack Brother   . Alzheimer's disease Mother   . Stroke Sister   . Diabetes Sister   . Alzheimer's disease Sister   . Hypertension Brother   . Diabetes Brother   . Drug abuse Brother   . Colon cancer Neg Hx     Medications: Patient's Medications  New Prescriptions   No medications on file  Previous Medications   ACETAMINOPHEN (TYLENOL) 325 MG TABLET    Take 975 mg by mouth every 6 (six) hours as needed.   ALLOPURINOL (ZYLOPRIM) 100 MG TABLET    Take 1 tablet (100 mg total) by mouth daily.   ASPIRIN EC 81 MG TABLET    Take 81 mg by mouth. Every 3 days   ATORVASTATIN (LIPITOR) 80 MG TABLET    Take 1 tablet (80 mg total) by mouth daily.   B-D UF III MINI PEN NEEDLES 31G X 5 MM MISC    USE FOUR TIMES DAILY   BIKTARVY 50-200-25 MG TABS TABLET    TAKE 1 TABLET BY MOUTH DAILY   BLOOD GLUCOSE MONITORING SUPPL (ONE TOUCH ULTRA MINI) W/DEVICE KIT    1 each by Does not apply route 2 (two) times daily. Dx: E11.40   CALCITRIOL (ROCALTROL) 0.25 MCG CAPSULE    Take 1  capsule by mouth daily.   CARVEDILOL (COREG) 25 MG TABLET    Take 1 tablet (25 mg total) by mouth 2 (two) times daily with a meal.   CHOLECALCIFEROL (VITAMIN D3) 50 MCG (2000 UT) TABS    Take 2,000 Units by mouth daily.   CHOLINE FENOFIBRATE (FENOFIBRIC ACID) 135 MG CPDR    TAKE 1 CAPSULE BY MOUTH DAILY   COLCHICINE 0.6 MG TABLET    Take one tablet by mouth as needed for gout   DOCUSATE SODIUM (COLACE) 100 MG CAPSULE    Take 100 mg by mouth as needed for mild constipation.   FUROSEMIDE (LASIX) 80 MG TABLET    Take 160 mg by mouth daily.    GABAPENTIN (NEURONTIN) 300 MG CAPSULE    TAKE 1 CAPSULE BY MOUTH THREE TIMES DAILY   GLUCOSAMINE-CHONDROIT-VIT C-MN (GLUCOSAMINE CHONDR 1500 COMPLX PO)    Take 2 tablets by mouth daily.    GLUCOSE BLOOD TEST STRIP    Use to test blood sugar three times daily. E11.40   HYDRALAZINE (APRESOLINE) 25 MG TABLET    TAKE 1 TABLET(25 MG) BY MOUTH THREE TIMES DAILY   INSULIN GLARGINE, 2 UNIT DIAL, (TOUJEO MAX SOLOSTAR) 300 UNIT/ML SOPN    Inject 80 Units into the skin daily.   INSULIN LISPRO (HUMALOG) 100 UNIT/ML KWIKPEN    Inject 0.2 mLs (20 Units total) into the skin 3 (three) times daily.   ISOSORBIDE MONONITRATE (IMDUR) 30 MG 24 HR TABLET    TAKE 1 TABLET(30 MG) BY MOUTH DAILY   LANCETS (ONETOUCH DELICA PLUS [id number]) MISC    1 each by Does not apply route 3 (three) times daily. Use to test blood sugar three times daily. Dx: E11.40   LATANOPROST (XALATAN) 0.005 % OPHTHALMIC SOLUTION    Place 1 drop into both eyes at bedtime. Reported on 04/04/2016   NITROGLYCERIN (NITROSTAT) 0.3 MG SL TABLET    ONE TABLET UNDER TONGUE AS NEEDED FOR CHEST PAIN   PANTOPRAZOLE (PROTONIX) 40 MG TABLET    Take 1 tablet (40 mg total) by mouth daily.   SODIUM BICARBONATE 650 MG TABLET    Take 650 mg by mouth 2 (two) times daily.    TIMOLOL (TIMOPTIC)  0.5 % OPHTHALMIC SOLUTION    Place 1 drop into both eyes every morning.   UMECLIDINIUM-VILANTEROL (ANORO ELLIPTA) 62.5-25 MCG/INH AEPB     INHALE 1 PUFF BY MOUTH EVERY DAY  Modified Medications   No medications on file  Discontinued Medications   No medications on file    Physical Exam:  There were no vitals filed for this visit. There is no height or weight on file to calculate BMI. Wt Readings from Last 3 Encounters:  06/14/19 205 lb (93 kg)  06/04/19 205 lb 12.8 oz (93.4 kg)  05/01/19 200 lb (90.7 kg)     Labs reviewed: Basic Metabolic Panel: Recent Labs    02/06/19 1045 02/07/19 0642 02/07/19 1956 02/09/19 0518 03/08/19  NA 138 140  --  139 142  142  K 4.5 4.5 4.4 4.7 4.5  4.5  CL 108 107  --  108  --   CO2 17* 19*  --  20*  --   GLUCOSE 263* 147*  --  163*  --   BUN 99* 98*  --  91* 70*  70*  CREATININE 5.74* 5.63*  --  5.08* 5.2*  5.2*  CALCIUM 8.8* 9.1  --  8.9  --    Liver Function Tests: Recent Labs    11/29/18 0933 02/06/19 1045 02/07/19 0642  AST '30 25 26  ' ALT '20 20 21  ' ALKPHOS  --  36* 40  BILITOT 0.4 0.5 0.9  PROT 7.9 7.1 7.6  ALBUMIN  --  3.3* 3.4*   Recent Labs    02/06/19 1045  LIPASE 110*   No results for input(s): AMMONIA in the last 8760 hours. CBC: Recent Labs    02/06/19 1045  02/07/19 0642 02/08/19 0509 02/09/19 0518  03/08/19  04/12/19 0857 04/25/19 0954 05/09/19 0952  WBC 5.8  --  6.8 7.0 5.9  --  6.1  6.1  --   --   --   --   NEUTROABS 2.7  --   --   --   --   --   --   --   --   --   --   HGB 6.4*   < > 8.2* 8.0* 8.1*   < > 10.3*  10.3*   < > 13.3 13.8 13.8  HCT 20.3*   < > 26.0* 26.1* 25.8*  --  32*  32*  --   --   --   --   MCV 96.2  --  91.9 94.9 93.5  --   --   --   --   --   --   PLT 199  --  201 195 211  --  19*  213  --   --   --   --    < > = values in this interval not displayed.   Lipid Panel: Recent Labs    11/29/18 0933 03/08/19 06/04/19 1019  CHOL 183 115  115 158  HDL 33* 18*  18* 19*  LDLCALC 114*  --   --   TRIG 240* 674*  674* 574*  CHOLHDL 5.5*  --  8.3*   TSH: No results for input(s): TSH in the last 8760 hours.  A1C: Lab Results  Component Value Date   HGBA1C 9.2 (H) 06/04/2019     Assessment/Plan 1. Hyperlipidemia associated with type 2 diabetes mellitus ([place]) -working on dietary modifications with better control in blood sugars due to elevated triglycerides. Continues on lipitor 80 mg daily  -  Lipid Panel; Future  2. Gout, unspecified cause, unspecified chronicity, unspecified site -continue on allopurinol.  - Uric acid; Future  3. Type 2 diabetes mellitus with diabetic neuropathy, with long-term current use of insulin ([place]) -tolerating toujeo max, has made some dietary modifications but needs ongoing education. Education provided today but plans to meet with nutritionist for more information. - To increase toujeo to 84.  If after 4 days your blood sugar is consistently over 150 to increase toujeo to 88 units.  Continues to novolog 20 units with meals. - Hemoglobin A1c; Future  4. Hemorrhoids, unspecified hemorrhoid type - continues with tucks pad as needed.  -to follow up in office if symptoms progress for evaluation.  Next appt: 3 months with labs prior to appt.  [name]. Harle Battiest  [facility] [phone]    Virtual Visit via Telephone Note  I connected with pt on 06/18/19 at 10:00 AM EDT by telephone and verified that I am speaking with the correct person using two identifiers.  Location: Patient: home Provider: office   I discussed the limitations, risks, security and privacy concerns of performing an evaluation and management service by telephone and the availability of in person appointments. I also discussed with the patient that there may be a patient responsible charge related to this service. The patient expressed understanding and agreed to proceed.   I discussed the assessment and treatment plan with the patient. The patient was provided an opportunity to ask questions and all were answered. The patient agreed with the plan and  demonstrated an understanding of the instructions.   The patient was advised to call back or seek an in-person evaluation if the symptoms worsen or if the condition fails to improve as anticipated.  I provided 18 minutes of non-face-to-face time during this encounter.  [name]. Harle Battiest Avs printed and mailed

## 2019-06-18 NOTE — Telephone Encounter (Signed)
Patient had a telephone visit today and questioned the add-on for the uric acid that was to be added to labs from 06/04/2019. I reviewed add on clipboard in the lab and Tracey called on 06/04/2019 to request uric acid add on and was told it could be added.   Linus Orn ([facility]) called customer service today and spoke with Maudie Mercury. Per Maudie Mercury they added a sed rate in error vs uric acid level. Maudie Mercury will follow the necessary steps to assure that patient is not charged for the sed rate added in error. It is too late for add on at this time and patient will need to return for another blood draw to get uric acid level.   [name], NP was verbally informed of the above and stated she will place order for Uric Acid level at next appointment on 06/28/2019.

## 2019-06-18 NOTE — Patient Instructions (Addendum)
To increase toujeo to 84, continue to work on diet modifications.  If after 4 days your blood sugar is consistently over 150 to increase toujeo to 88 units.   Continues to novolog 20 units with meals  Continue to work on dietary modifications.    Hemorrhoids Hemorrhoids are swollen veins in and around the rectum or anus. There are two types of hemorrhoids:  Internal hemorrhoids. These occur in the veins that are just inside the rectum. They may poke through to the outside and become irritated and painful.  External hemorrhoids. These occur in the veins that are outside the anus and can be felt as a painful swelling or hard lump near the anus. Most hemorrhoids do not cause serious problems, and they can be managed with home treatments such as diet and lifestyle changes. If home treatments do not help the symptoms, procedures can be done to shrink or remove the hemorrhoids. What are the causes? This condition is caused by increased pressure in the anal area. This pressure may result from various things, including:  Constipation.  Straining to have a bowel movement.  Diarrhea.  Pregnancy.  Obesity.  Sitting for long periods of time.  Heavy lifting or other activity that causes you to strain.  Anal sex.  Riding a bike for a long period of time. What are the signs or symptoms? Symptoms of this condition include:  Pain.  Anal itching or irritation.  Rectal bleeding.  Leakage of stool (feces).  Anal swelling.  One or more lumps around the anus. How is this diagnosed? This condition can often be diagnosed through a visual exam. Other exams or tests may also be done, such as:  An exam that involves feeling the rectal area with a gloved hand (digital rectal exam).  An exam of the anal canal that is done using a small tube (anoscope).  A blood test, if you have lost a significant amount of blood.  A test to look inside the colon using a flexible tube with a camera on  the end (sigmoidoscopy or colonoscopy). How is this treated? This condition can usually be treated at home. However, various procedures may be done if dietary changes, lifestyle changes, and other home treatments do not help your symptoms. These procedures can help make the hemorrhoids smaller or remove them completely. Some of these procedures involve surgery, and others do not. Common procedures include:  Rubber band ligation. Rubber bands are placed at the base of the hemorrhoids to cut off their blood supply.  Sclerotherapy. Medicine is injected into the hemorrhoids to shrink them.  Infrared coagulation. A type of light energy is used to get rid of the hemorrhoids.  Hemorrhoidectomy surgery. The hemorrhoids are surgically removed, and the veins that supply them are tied off.  Stapled hemorrhoidopexy surgery. The surgeon staples the base of the hemorrhoid to the rectal wall. Follow these instructions at home: Eating and drinking   Eat foods that have a lot of fiber in them, such as whole grains, beans, nuts, fruits, and vegetables.  Ask your health care provider about taking products that have added fiber (fiber supplements).  Reduce the amount of fat in your diet. You can do this by eating low-fat dairy products, eating less red meat, and avoiding processed foods.  Drink enough fluid to keep your urine pale yellow. Managing pain and swelling   Take warm sitz baths for 20 minutes, 3-4 times a day to ease pain and discomfort. You may do this in a bathtub  or using a portable sitz bath that fits over the toilet.  If directed, apply ice to the affected area. Using ice packs between sitz baths may be helpful. ? Put ice in a plastic bag. ? Place a towel between your skin and the bag. ? Leave the ice on for 20 minutes, 2-3 times a day. General instructions  Take over-the-counter and prescription medicines only as told by your health care provider.  Use medicated creams or  suppositories as told.  Get regular exercise. Ask your health care provider how much and what kind of exercise is best for you. In general, you should do moderate exercise for at least 30 minutes on most days of the week (150 minutes each week). This can include activities such as walking, biking, or yoga.  Go to the bathroom when you have the urge to have a bowel movement. Do not wait.  Avoid straining to have bowel movements.  Keep the anal area dry and clean. Use wet toilet paper or moist towelettes after a bowel movement.  Do not sit on the toilet for long periods of time. This increases blood pooling and pain.  Keep all follow-up visits as told by your health care provider. This is important. Contact a health care provider if you have:  Increasing pain and swelling that are not controlled by treatment or medicine.  Difficulty having a bowel movement, or you are unable to have a bowel movement.  Pain or inflammation outside the area of the hemorrhoids. Get help right away if you have:  Uncontrolled bleeding from your rectum. Summary  Hemorrhoids are swollen veins in and around the rectum or anus.  Most hemorrhoids can be managed with home treatments such as diet and lifestyle changes.  Taking warm sitz baths can help ease pain and discomfort.  In severe cases, procedures or surgery can be done to shrink or remove the hemorrhoids. This information is not intended to replace advice given to you by your health care provider. Make sure you discuss any questions you have with your health care provider. Document Released: 10/07/2000 Document Revised: 10/18/2018 Document Reviewed: 03/01/2018 Elsevier Patient Education  2020 [employer].

## 2019-06-18 NOTE — Telephone Encounter (Signed)
Patient called and stated that the pharmacy needed an updated Rx with increase because they are only giving him 2-3 pens at a time and it is not lasting long.

## 2019-06-24 ENCOUNTER — Other Ambulatory Visit: Payer: Medicare Other

## 2019-06-24 ENCOUNTER — Other Ambulatory Visit: Payer: Self-pay

## 2019-06-24 DIAGNOSIS — B20 Human immunodeficiency virus [HIV] disease: Secondary | ICD-10-CM

## 2019-06-25 LAB — T-HELPER CELL (CD4) - (RCID CLINIC ONLY)
CD4 % Helper T Cell: 44 % (ref 33–65)
CD4 T Cell Abs: 927 /uL (ref 400–1790)

## 2019-06-28 ENCOUNTER — Other Ambulatory Visit: Payer: Self-pay

## 2019-06-28 ENCOUNTER — Ambulatory Visit (INDEPENDENT_AMBULATORY_CARE_PROVIDER_SITE_OTHER): Payer: Medicare Other | Admitting: Nurse Practitioner

## 2019-06-28 ENCOUNTER — Ambulatory Visit: Payer: Self-pay

## 2019-06-28 ENCOUNTER — Encounter: Payer: Self-pay | Admitting: Nurse Practitioner

## 2019-06-28 VITALS — BP 120/70 | HR 89 | Temp 98.7°F | Ht 74.0 in | Wt 207.8 lb

## 2019-06-28 DIAGNOSIS — Z0000 Encounter for general adult medical examination without abnormal findings: Secondary | ICD-10-CM

## 2019-06-28 DIAGNOSIS — Z720 Tobacco use: Secondary | ICD-10-CM | POA: Diagnosis not present

## 2019-06-28 DIAGNOSIS — E1140 Type 2 diabetes mellitus with diabetic neuropathy, unspecified: Secondary | ICD-10-CM | POA: Diagnosis not present

## 2019-06-28 DIAGNOSIS — Z794 Long term (current) use of insulin: Secondary | ICD-10-CM | POA: Diagnosis not present

## 2019-06-28 LAB — COMPREHENSIVE METABOLIC PANEL
AG Ratio: 1 (calc) (ref 1.0–2.5)
ALT: 25 U/L (ref 9–46)
AST: 30 U/L (ref 10–35)
Albumin: 3.9 g/dL (ref 3.6–5.1)
Alkaline phosphatase (APISO): 62 U/L (ref 35–144)
BUN/Creatinine Ratio: 14 (calc) (ref 6–22)
BUN: 67 mg/dL — ABNORMAL HIGH (ref 7–25)
CO2: 23 mmol/L (ref 20–32)
Calcium: 9.5 mg/dL (ref 8.6–10.3)
Chloride: 108 mmol/L (ref 98–110)
Creat: 4.94 mg/dL — ABNORMAL HIGH (ref 0.70–1.18)
Globulin: 3.9 g/dL (calc) — ABNORMAL HIGH (ref 1.9–3.7)
Glucose, Bld: 128 mg/dL — ABNORMAL HIGH (ref 65–99)
Potassium: 4.3 mmol/L (ref 3.5–5.3)
Sodium: 141 mmol/L (ref 135–146)
Total Bilirubin: 0.4 mg/dL (ref 0.2–1.2)
Total Protein: 7.8 g/dL (ref 6.1–8.1)

## 2019-06-28 LAB — CBC
HCT: 33.5 % — ABNORMAL LOW (ref 38.5–50.0)
Hemoglobin: 11.2 g/dL — ABNORMAL LOW (ref 13.2–17.1)
MCH: 31.9 pg (ref 27.0–33.0)
MCHC: 33.4 g/dL (ref 32.0–36.0)
MCV: 95.4 fL (ref 80.0–100.0)
MPV: 12.3 fL (ref 7.5–12.5)
Platelets: 187 10*3/uL (ref 140–400)
RBC: 3.51 10*6/uL — ABNORMAL LOW (ref 4.20–5.80)
RDW: 16.8 % — ABNORMAL HIGH (ref 11.0–15.0)
WBC: 5.7 10*3/uL (ref 3.8–10.8)

## 2019-06-28 LAB — HIV-1 RNA QUANT-NO REFLEX-BLD
HIV 1 RNA Quant: <20 copies/mL
HIV-1 RNA Quant, Log: <1.3 Log copies/mL

## 2019-06-28 LAB — RPR: RPR Ser Ql: REACTIVE — AB

## 2019-06-28 LAB — FLUORESCENT TREPONEMAL AB(FTA)-IGG-BLD: Fluorescent Treponemal ABS: REACTIVE — AB

## 2019-06-28 LAB — RPR TITER: RPR Titer: 1:1 {titer} — ABNORMAL HIGH

## 2019-06-28 MED ORDER — TOUJEO MAX SOLOSTAR 300 UNIT/ML ~~LOC~~ SOPN: 88.0000 [IU] | PEN_INJECTOR | Freq: Every day | SUBCUTANEOUS | 3 refills | Status: DC

## 2019-06-28 NOTE — Progress Notes (Signed)
Subjective:   Jacob [name] is a 68 y.o. male who presents for Medicare Annual/Subsequent preventive examination.  Review of Systems:   Cardiac Risk Factors include: advanced age (>21mn, >>78women);diabetes mellitus;dyslipidemia;hypertension;male gender;family history of premature cardiovascular disease;smoking/ tobacco exposure;microalbuminuria     Objective:    Vitals: BP 120/70    Pulse 89    Temp 98.7 F (37.1 C) (Oral)    Ht '6\' 2"'  (1.88 m)    Wt 207 lb 12.8 oz (94.3 kg)    SpO2 97%    BMI 26.68 kg/m   Body mass index is 26.68 kg/m.  Advanced Directives 06/28/2019 05/06/2019 03/04/2019 02/12/2019 02/09/2019 02/08/2019 02/06/2019  Does Patient Have a Medical Advance Directive? Yes Yes Yes Yes - No Yes  Type of Advance Directive Living will Living will Living will Living will - - [facility] will  Does patient want to make changes to medical advance directive? No - Patient declined No - Patient declined No - Patient declined No - Patient declined - - No - Patient declined  Copy of [place] Chart? - - - - - - No - copy requested  Would patient like information on creating a medical advance directive? - - - - No - Patient declined - -    Tobacco Social History   Tobacco Use  Smoking Status Current Every Day Smoker   Packs/day: 1.00   Years: 43.00   Pack years: 43.00   Types: Cigarettes  Smokeless Tobacco Never Used     Ready to quit: Not Answered Counseling given: Not Answered   Clinical Intake:  Pre-visit preparation completed: Yes  Pain : No/denies pain     BMI - recorded: 26.68 Nutritional Status: BMI 25 -29 Overweight Nutritional Risks: None Diabetes: Yes CBG done?: No Did pt. bring in CBG monitor from home?: No  How often do you need to have someone help you when you read instructions, pamphlets, or other written materials from your doctor or pharmacy?: 1 - Never What is the last grade level you completed in  school?: high school- 12th  Interpreter Needed?: No     Past Medical History:  Diagnosis Date   Acute respiratory failure ([place] 03/01/2018   Arthritis    "all over; mostly knees and back" (02/28/2018)   CHF (congestive heart failure) ([facility]    not on any meds   Chronic lower back pain    stenosis   CKD (chronic kidney disease), stage V ([place]    /[name]5/05/2018   Community acquired pneumonia 09/06/2013   Coronary atherosclerosis of native coronary artery 2005   s/p surgery   Drug abuse ([place]    hx; tested for cocaine as recently as 2/08. says he is not using drugs now - avoided defib. for this reason    GERD (gastroesophageal reflux disease)    takes OTC meds as needed   GI bleeding 02/06/2019   Glaucoma    uses eye drops daily   Hepatitis B 1968   "tx'd w/isolation; caught it from toilet stools in gym"   History of blood transfusion 03/01/2019   History of colon polyps    benign   History of gout    takes Allopurinol daily as well as Colchicine-if needed (02/28/2018)   History of kidney stones    HTN (hypertension)    takes Coreg,Imdur.and Apresoline daily   Human immunodeficiency virus (HIV) disease ([place] dx'd 1995   takes Genvoya daily   Hyperlipidemia  takes Atorvastatin daily   Ischemic cardiomyopathy    Muscle spasm    takes Zanaflex as needed   Myocardial infarction ([place]) ~ 2004/2005   Nocturia    Peripheral neuropathy    takes gabapentin daily   Pneumonia    "at least twice" (02/28/2018)   Shortness of breath dyspnea    rarely but if notices it then with exertion   Syphilis, unspecified    Type II diabetes mellitus ([place]) 2004   Lantus daily.Average fasting blood sugar 125-199   Wears glasses    Wears partial dentures    Past Surgical History:  Procedure Laterality Date   AV FISTULA PLACEMENT Left 08/02/2018   Procedure: ARTERIOVENOUS (AV) FISTULA CREATION  left arm radiocephlic;  Surgeon: Marty Heck, MD;  Location:  [facility] OR;  Service: Vascular;  Laterality: Left;   BACK SURGERY     CARDIAC CATHETERIZATION  10/2002; 12/19/2004   [name] 03/08/2011   COLONOSCOPY  2013   [place]    CORONARY ARTERY BYPASS GRAFT  02/24/2003   CABG X2/notes 03/08/2011   ESOPHAGOGASTRODUODENOSCOPY (EGD) WITH PROPOFOL N/A 02/08/2019   Procedure: ESOPHAGOGASTRODUODENOSCOPY (EGD) WITH PROPOFOL;  Surgeon: Milus Banister, MD;  Location: [place];  Service: Gastroenterology;  Laterality: N/A;   FRACTURE SURGERY     HOT HEMOSTASIS N/A 02/08/2019   Procedure: HOT HEMOSTASIS (ARGON PLASMA COAGULATION/BICAP);  Surgeon: Milus Banister, MD;  Location: [facility] ENDOSCOPY;  Service: Gastroenterology;  Laterality: N/A;   INTERTROCHANTERIC HIP FRACTURE SURGERY Left 11/2006   [name] 03/08/2011   LAPAROSCOPIC CHOLECYSTECTOMY  05/2006   LIGATION OF COMPETING BRANCHES OF ARTERIOVENOUS FISTULA Left 11/05/2018   Procedure: LIGATION OF COMPETING BRANCHES OF ARTERIOVENOUS FISTULA  LEFT  ARM;  Surgeon: Marty Heck, MD;  Location: [facility] OR;  Service: Vascular;  Laterality: Left;   LUMBAR LAMINECTOMY/DECOMPRESSION MICRODISCECTOMY N/A 02/29/2016   Procedure: Left L4-5 Lateral Recess Decompression, Removal Extradural Intraspinal Facet Cyst;  Surgeon: Marybelle Killings, MD;  Location: [place];  Service: Orthopedics;  Laterality: N/A;   MULTIPLE TOOTH EXTRACTIONS     ORIF MANDIBULAR FRACTURE Left 08/13/2004   ORIF of left body fracture mandible with KLS Martin 2.3-mm six hole/notes 03/08/2011   Family History  Problem Relation Age of Onset   Heart failure Father    Hypertension Father    Diabetes Brother    Heart attack Brother    Alzheimer's disease Mother    Stroke Sister    Diabetes Sister    Alzheimer's disease Sister    Hypertension Brother    Diabetes Brother    Drug abuse Brother    Colon cancer Neg Hx    Social History   Socioeconomic History   Marital status: Single    Spouse name: Not on file   Number of  children: 1   Years of education: Not on file   Highest education level: Not on file  Occupational History   Occupation: retired   Occupation: part time- [employer] resource strain: Not hard at all   Food insecurity    Worry: Never true    Inability: Never true   Transportation needs    Medical: No    Non-medical: No  Tobacco Use   Smoking status: Current Every Day Smoker    Packs/day: 1.00    Years: 43.00    Pack years: 43.00    Types: Cigarettes   Smokeless tobacco: Never Used  Substance and Sexual Activity   Alcohol use: Yes  Alcohol/week: 12.0 standard drinks    Types: 12 Standard drinks or equivalent per week    Comment: 6x 24oz 8% ABV beers a week   Drug use: No    Types: Cocaine    Comment: hx of crack/cocaine 50yr ago   Sexual activity: Not Currently  Lifestyle   Physical activity    Days per week: 7 days    Minutes per session: 20 min   Stress: Only a little  Relationships   Social connections    Talks on phone: Three times a week    Gets together: Three times a week    Attends religious service: More than 4 times per year    Active member of club or organization: No    Attends meetings of clubs or organizations: Never    Relationship status: Never married  Other Topics Concern   Not on file  Social History Narrative   Retired.       As of 05/2015:   Diet: No salt    Caffeine   Married: Single    House: Condo, 2 stories, 1 person (self)    Pets: No    Current/Past profession: N/A   Exercise: walks daily    Living Will: Yes    DNR   POA/HPOA: No        Outpatient Encounter Medications as of 06/28/2019  Medication Sig   acetaminophen (TYLENOL) 325 MG tablet Take 975 mg by mouth every 6 (six) hours as needed.   allopurinol (ZYLOPRIM) 100 MG tablet Take 1 tablet (100 mg total) by mouth daily.   aspirin EC 81 MG tablet Take 81 mg by mouth. Every 3 days   atorvastatin (LIPITOR) 80 MG tablet Take 1 tablet  (80 mg total) by mouth daily.   B-D UF III MINI PEN NEEDLES 31G X 5 MM MISC USE FOUR TIMES DAILY   BIKTARVY 50-200-25 MG TABS tablet TAKE 1 TABLET BY MOUTH DAILY   Blood Glucose Monitoring Suppl (ONE TOUCH ULTRA MINI) w/Device KIT 1 each by Does not apply route 2 (two) times daily. Dx: E11.40   calcitRIOL (ROCALTROL) 0.25 MCG capsule Take 1 capsule by mouth daily.   carvedilol (COREG) 25 MG tablet Take 1 tablet (25 mg total) by mouth 2 (two) times daily with a meal.   Cholecalciferol (VITAMIN D3) 50 MCG (2000 UT) TABS Take 2,000 Units by mouth daily.   Choline Fenofibrate (FENOFIBRIC ACID) 135 MG CPDR TAKE 1 CAPSULE BY MOUTH DAILY   colchicine 0.6 MG tablet Take one tablet by mouth as needed for gout   furosemide (LASIX) 80 MG tablet Take 160 mg by mouth 3 (three) times daily.    gabapentin (NEURONTIN) 300 MG capsule TAKE 1 CAPSULE BY MOUTH THREE TIMES DAILY   Glucosamine-Chondroit-Vit C-Mn (GLUCOSAMINE CHONDR 1500 COMPLX PO) Take 2 tablets by mouth daily.    glucose blood test strip Use to test blood sugar three times daily. E11.40   hydrALAZINE (APRESOLINE) 25 MG tablet TAKE 1 TABLET(25 MG) BY MOUTH THREE TIMES DAILY   Insulin Glargine, 2 Unit Dial, (TOUJEO MAX SOLOSTAR) 300 UNIT/ML SOPN Inject 88 Units into the skin daily.   insulin lispro (HUMALOG) 100 UNIT/ML KwikPen Inject 0.2 mLs (20 Units total) into the skin 3 (three) times daily.   isosorbide mononitrate (IMDUR) 30 MG 24 hr tablet TAKE 1 TABLET(30 MG) BY MOUTH DAILY   Lancets (ONETOUCH DELICA PLUS [id number] MISC 1 each by Does not apply route 3 (three) times daily. Use to test blood sugar  three times daily. Dx: E11.40   latanoprost (XALATAN) 0.005 % ophthalmic solution Place 1 drop into both eyes at bedtime. Reported on 04/04/2016   nitroGLYCERIN (NITROSTAT) 0.3 MG SL tablet ONE TABLET UNDER TONGUE AS NEEDED FOR CHEST PAIN   pantoprazole (PROTONIX) 40 MG tablet Take 1 tablet (40 mg total) by mouth daily.   sodium  bicarbonate 650 MG tablet Take 650 mg by mouth 2 (two) times daily.    timolol (TIMOPTIC) 0.5 % ophthalmic solution Place 1 drop into both eyes every morning.   umeclidinium-vilanterol (ANORO ELLIPTA) 62.5-25 MCG/INH AEPB INHALE 1 PUFF BY MOUTH EVERY DAY   [DISCONTINUED] docusate sodium (COLACE) 100 MG capsule Take 100 mg by mouth as needed for mild constipation.   [DISCONTINUED] Insulin Glargine, 2 Unit Dial, (TOUJEO MAX SOLOSTAR) 300 UNIT/ML SOPN Inject 80 Units into the skin daily. (Patient not taking: Reported on 06/28/2019)   No facility-administered encounter medications on file as of 06/28/2019.     Activities of Daily Living In your present state of health, do you have any difficulty performing the following activities: 06/28/2019 02/06/2019  Hearing? N -  Vision? N -  Difficulty concentrating or making decisions? N -  Walking or climbing stairs? N -  Dressing or bathing? N -  Doing errands, shopping? N N  Preparing Food and eating ? N -  Using the Toilet? N -  In the past six months, have you accidently leaked urine? N -  Do you have problems with loss of bowel control? N -  Managing your Medications? N -  Managing your Finances? N -  Housekeeping or managing your Housekeeping? N -  Some recent data might be hidden    Patient Care Team: [name], NP as PCP - General (Geriatric Medicine) [name], MD as PCP - Infectious Diseases (Infectious Diseases) [name], MD as Consulting Physician (Ophthalmology) [name], MD as Consulting Physician (Cardiology) [name], MD as Consulting Physician (Infectious Diseases) Gardiner Barefoot, DPM as Consulting Physician (Podiatry) Madelon Lips, MD as Consulting Physician (Nephrology)   Assessment:   This is a routine wellness examination for Gwyndolyn Saxon.  Exercise Activities and Dietary recommendations Current Exercise Habits: Home exercise routine, Type of exercise: walking, Time (Minutes): 20, Frequency  (Times/Week): 2, Weekly Exercise (Minutes/Week): 40, Intensity: Moderate  Goals     Blood Pressure < 140/90     Exercise 3x per week (30 min per time)     Increase excise to 4 times a week      HEMOGLOBIN A1C < 7.0     LDL CALC < 100     Quit smoking / using tobacco     Starting 08/18/16, I will attempt to stop smoking over the next year.        Fall Risk Fall Risk  06/28/2019 06/18/2019 06/04/2019 05/06/2019 03/04/2019  Falls in the past year? 0 0 0 0 0  Comment - - - - -  Number falls in past yr: 0 0 0 0 0  Comment - - - - -  Injury with Fall? 0 0 0 0 0  Comment - - - - -  Risk Factor Category  - - - - -  Risk for fall due to : - - - - -  Risk for fall due to: Comment - - - - -  Follow up - - - - -   Is the patient's home free of loose throw rugs in walkways, pet beds, electrical cords, etc?  yes      Grab bars in the bathroom? no      Handrails on the stairs?   yes      Adequate lighting?   yes  Timed Get Up and Go Performed: na  Depression Screen PHQ 2/9 Scores 06/28/2019 06/26/2018 12/27/2017 10/11/2017  PHQ - 2 Score 0 0 0 0  PHQ- 9 Score - - - -    Cognitive Function MMSE - Mini Mental State Exam 06/28/2019 06/26/2018 07/21/2017 08/18/2016 08/07/2015  Not completed: - - - - (No Data)  Orientation to time '4 4 5 5 5  ' Orientation to Place '5 5 5 5 5  ' Registration '3 3 3 3 3  ' Attention/ Calculation '5 5 5 5 5  ' Recall '3 2 2 2 3  ' Language- name 2 objects '2 2 2 2 2  ' Language- repeat '1 1 1 1 1  ' Language- follow 3 step command '3 3 3 3 3  ' Language- read & follow direction '1 1 1 1 1  ' Write a sentence '1 1 1 1 1  ' Copy design 1 0 1 1 0  Total score '29 27 29 29 29        ' Immunization History  Administered Date(s) Administered   Fluad Quad(high Dose 65+) 06/14/2019   Hepatitis B 08/28/2006, 10/02/2007, 04/01/2008   Hepatitis B, adult 06/03/2014, 07/04/2014   Influenza Split 07/28/2011   Influenza Whole 08/28/2006, 09/10/2007, 09/15/2008, 08/03/2009, 07/26/2010    Influenza, High Dose Seasonal PF 07/04/2018   Influenza,inj,Quad PF,6+ Mos 07/04/2014, 07/06/2015, 07/12/2016, 07/11/2017   Influenza-Unspecified 06/24/2013   Pneumococcal Conjugate-13 08/18/2016   Pneumococcal Polysaccharide-23 08/28/2006, 07/28/2011, 05/30/2018   Tdap 06/14/2019   Zoster 06/03/2014   Zoster Recombinat (Shingrix) 07/24/2017, 01/09/2018    Qualifies for Shingles Vaccine? Yes, completed.  Screening Tests Health Maintenance  Topic Date Due   OPHTHALMOLOGY EXAM  07/10/2019   HEMOGLOBIN A1C  09/04/2019   FOOT EXAM  12/06/2019   LIPID PANEL  06/03/2020   COLONOSCOPY  10/01/2022   TETANUS/TDAP  06/13/2029   INFLUENZA VACCINE  Completed   Hepatitis C Screening  Completed   PNA vac Low Risk Adult  Completed   Cancer Screenings: Lung: Low Dose CT Chest recommended if Age 52-80 years, 30 pack-year currently smoking OR have quit w/in 15years. Patient does qualify. Colorectal: up to date  Additional Screenings: Hepatitis C Screening: completed      Plan:     I have personally reviewed and noted the following in the patients chart:    Medical and social history  Use of alcohol, tobacco or illicit drugs   Current medications and supplements  Functional ability and status  Nutritional status  Physical activity  Advanced directives  List of other physicians  Hospitalizations, surgeries, and ER visits in previous 12 months  Vitals  Screenings to include cognitive, depression, and falls  Referrals and appointments  In addition, I have reviewed and discussed with patient certain preventive protocols, quality metrics, and best practice recommendations. A written personalized care plan for preventive services as well as general preventive health recommendations were provided to patient.     [name], NP  06/28/2019

## 2019-06-28 NOTE — Patient Instructions (Addendum)
Jacob [name] , Thank you for taking time to come for your Medicare Wellness Visit. I appreciate your ongoing commitment to your health goals. Please review the following plan we discussed and let me know if I can assist you in the future.   Screening recommendations/referrals: Colonoscopy up to date Recommended yearly ophthalmology/optometry visit for glaucoma screening and checkup Recommended yearly dental visit for hygiene and checkup  Vaccinations: Influenza vaccine up to date Pneumococcal vaccine up to date Tdap vaccine up to date Shingles vaccine up to date    Advanced directives: to complete and bring back to office once notarized so we can have on file.  Conditions/risks identified: progressive symptoms due to uncontrolled diabetes. Cardiovascular disease   Next appointment: 1 year.   Preventive Care 70 Years and Older, Male Preventive care refers to lifestyle choices and visits with your health care provider that can promote health and wellness. What does preventive care include?  A yearly physical exam. This is also called an annual well check.  Dental exams once or twice a year.  Routine eye exams. Ask your health care provider how often you should have your eyes checked.  Personal lifestyle choices, including:  Daily care of your teeth and gums.  Regular physical activity.  Eating a healthy diet.  Avoiding tobacco and drug use.  Limiting alcohol use.  Practicing safe sex.  Taking low doses of aspirin every day.  Taking vitamin and mineral supplements as recommended by your health care provider. What happens during an annual well check? The services and screenings done by your health care provider during your annual well check will depend on your age, overall health, lifestyle risk factors, and family history of disease. Counseling  Your health care provider may ask you questions about your:  Alcohol use.  Tobacco use.  Drug use.  Emotional well-being.   Home and relationship well-being.  Sexual activity.  Eating habits.  History of falls.  Memory and ability to understand (cognition).  Work and work Statistician. Screening  You may have the following tests or measurements:  Height, weight, and BMI.  Blood pressure.  Lipid and cholesterol levels. These may be checked every 5 years, or more frequently if you are over 58 years old.  Skin check.  Lung cancer screening. You may have this screening every year starting at age 35 if you have a 30-pack-year history of smoking and currently smoke or have quit within the past 15 years.  Fecal occult blood test (FOBT) of the stool. You may have this test every year starting at age 40.  Flexible sigmoidoscopy or colonoscopy. You may have a sigmoidoscopy every 5 years or a colonoscopy every 10 years starting at age 58.  Prostate cancer screening. Recommendations will vary depending on your family history and other risks.  Hepatitis C blood test.  Hepatitis B blood test.  Sexually transmitted disease (STD) testing.  Diabetes screening. This is done by checking your blood sugar (glucose) after you have not eaten for a while (fasting). You may have this done every 1-3 years.  Abdominal aortic aneurysm (AAA) screening. You may need this if you are a current or former smoker.  Osteoporosis. You may be screened starting at age 1 if you are at high risk. Talk with your health care provider about your test results, treatment options, and if necessary, the need for more tests. Vaccines  Your health care provider may recommend certain vaccines, such as:  Influenza vaccine. This is recommended every year.  Tetanus,  diphtheria, and acellular pertussis (Tdap, Td) vaccine. You may need a Td booster every 10 years.  Zoster vaccine. You may need this after age 31.  Pneumococcal 13-valent conjugate (PCV13) vaccine. One dose is recommended after age 62.  Pneumococcal polysaccharide (PPSV23)  vaccine. One dose is recommended after age 70. Talk to your health care provider about which screenings and vaccines you need and how often you need them. This information is not intended to replace advice given to you by your health care provider. Make sure you discuss any questions you have with your health care provider. Document Released: 11/06/2015 Document Revised: 06/29/2016 Document Reviewed: 08/11/2015 Elsevier Interactive Patient Education  2017 [place] Prevention in the Home Falls can cause injuries. They can happen to people of all ages. There are many things you can do to make your home safe and to help prevent falls. What can I do on the outside of my home?  Regularly fix the edges of walkways and driveways and fix any cracks.  Remove anything that might make you trip as you walk through a door, such as a raised step or threshold.  Trim any bushes or trees on the path to your home.  Use bright outdoor lighting.  Clear any walking paths of anything that might make someone trip, such as rocks or tools.  Regularly check to see if handrails are loose or broken. Make sure that both sides of any steps have handrails.  Any raised decks and porches should have guardrails on the edges.  Have any leaves, snow, or ice cleared regularly.  Use sand or salt on walking paths during winter.  Clean up any spills in your garage right away. This includes oil or grease spills. What can I do in the bathroom?  Use night lights.  Install grab bars by the toilet and in the tub and shower. Do not use towel bars as grab bars.  Use non-skid mats or decals in the tub or shower.  If you need to sit down in the shower, use a plastic, non-slip stool.  Keep the floor dry. Clean up any water that spills on the floor as soon as it happens.  Remove soap buildup in the tub or shower regularly.  Attach bath mats securely with double-sided non-slip rug tape.  Do not have throw rugs  and other things on the floor that can make you trip. What can I do in the bedroom?  Use night lights.  Make sure that you have a light by your bed that is easy to reach.  Do not use any sheets or blankets that are too big for your bed. They should not hang down onto the floor.  Have a firm chair that has side arms. You can use this for support while you get dressed.  Do not have throw rugs and other things on the floor that can make you trip. What can I do in the kitchen?  Clean up any spills right away.  Avoid walking on wet floors.  Keep items that you use a lot in easy-to-reach places.  If you need to reach something above you, use a strong step stool that has a grab bar.  Keep electrical cords out of the way.  Do not use floor polish or wax that makes floors slippery. If you must use wax, use non-skid floor wax.  Do not have throw rugs and other things on the floor that can make you trip. What can I do with  my stairs?  Do not leave any items on the stairs.  Make sure that there are handrails on both sides of the stairs and use them. Fix handrails that are broken or loose. Make sure that handrails are as long as the stairways.  Check any carpeting to make sure that it is firmly attached to the stairs. Fix any carpet that is loose or worn.  Avoid having throw rugs at the top or bottom of the stairs. If you do have throw rugs, attach them to the floor with carpet tape.  Make sure that you have a light switch at the top of the stairs and the bottom of the stairs. If you do not have them, ask someone to add them for you. What else can I do to help prevent falls?  Wear shoes that:  Do not have high heels.  Have rubber bottoms.  Are comfortable and fit you well.  Are closed at the toe. Do not wear sandals.  If you use a stepladder:  Make sure that it is fully opened. Do not climb a closed stepladder.  Make sure that both sides of the stepladder are locked into  place.  Ask someone to hold it for you, if possible.  Clearly mark and make sure that you can see:  Any grab bars or handrails.  First and last steps.  Where the edge of each step is.  Use tools that help you move around (mobility aids) if they are needed. These include:  Canes.  Walkers.  Scooters.  Crutches.  Turn on the lights when you go into a dark area. Replace any light bulbs as soon as they burn out.  Set up your furniture so you have a clear path. Avoid moving your furniture around.  If any of your floors are uneven, fix them.  If there are any pets around you, be aware of where they are.  Review your medicines with your doctor. Some medicines can make you feel dizzy. This can increase your chance of falling. Ask your doctor what other things that you can do to help prevent falls. This information is not intended to replace advice given to you by your health care provider. Make sure you discuss any questions you have with your health care provider. Document Released: 08/06/2009 Document Revised: 03/17/2016 Document Reviewed: 11/14/2014 Elsevier Interactive Patient Education  2017 [employer].

## 2019-06-28 NOTE — Progress Notes (Signed)
Careteam: Patient Care Team: [name], NP as PCP - General (Geriatric Medicine) [name], MD as PCP - Infectious Diseases (Infectious Diseases) [name], MD as Consulting Physician (Ophthalmology) [name], MD as Consulting Physician (Cardiology) [name], MD as Consulting Physician (Infectious Diseases) Gardiner Barefoot, DPM as Consulting Physician (Podiatry) Madelon Lips, MD as Consulting Physician (Nephrology)  Advanced Directive information    Allergies  Allergen Reactions  . Augmentin [Amoxicillin-Pot Clavulanate] Diarrhea    Severe diarrhea   . Amphetamines Other (See Comments)    Unknown reaction type    Chief Complaint  Patient presents with  . Follow-up    Follow-up on blood sugars   . Medication Refill    New RX for Toujeo Max to reflect medication change      HPI: Patient is a 68 y.o. male seen in the office today to follow up blood sugars.   DM- toujeo max taking 88 units now- for 6 days No hypoglycemia- working well.  Fasting 137, 190, 219, 125, 161, 210 He is also working on diet modifications (gave up popicles and increasing physical activity) Evening readings- 144, 196, 127, 153 Eating more salads  Has changed to beer before dinner vs wine due to sugar.  No sodas, if so drinking diet.  appt to meet with nutritionist later this month.   Review of Systems:  Review of Systems  Constitutional: Negative for chills and fever.  Eyes: Negative for blurred vision.  Respiratory: Negative for cough, sputum production and shortness of breath.   Cardiovascular: Negative for chest pain, palpitations and leg swelling.  Gastrointestinal: Positive for diarrhea. Negative for abdominal pain, blood in stool and constipation.  Genitourinary: Negative for dysuria, frequency and urgency.  Musculoskeletal: Negative for joint pain and myalgias.  Neurological: Positive for tingling and sensory change. Negative for dizziness and  headaches.       Neuropathy to LE due to diabetes  Psychiatric/Behavioral: Negative for depression. The patient is not nervous/anxious.     Past Medical History:  Diagnosis Date  . Acute respiratory failure ([place]) 03/01/2018  . Arthritis    "all over; mostly knees and back" (02/28/2018)  . CHF (congestive heart failure) (H[facility])    not on any meds  . Chronic lower back pain    stenosis  . CKD (chronic kidney disease), stage V ([place])    [name] 02/28/2018  . Community acquired pneumonia 09/06/2013  . Coronary atherosclerosis of native coronary artery 2005   s/p surgery  . Drug abuse ([place])    hx; tested for cocaine as recently as 2/08. says he is not using drugs now - avoided defib. for this reason   . GERD (gastroesophageal reflux disease)    takes OTC meds as needed  . GI bleeding 02/06/2019  . Glaucoma    uses eye drops daily  . Hepatitis B 1968   "tx'd w/isolation; caught it from toilet stools in gym"  . History of blood transfusion 03/01/2019  . History of colon polyps    benign  . History of gout    takes Allopurinol daily as well as Colchicine-if needed (02/28/2018)  . History of kidney stones   . HTN (hypertension)    takes Coreg,Imdur.and Apresoline daily  . Human immunodeficiency virus (HIV) disease ([place]) dx'd 1995   takes Genvoya daily  . Hyperlipidemia    takes Atorvastatin daily  . Ischemic cardiomyopathy   . Muscle spasm    takes Zanaflex as needed  . Myocardial infarction ([place]) ~  2004/2005  . Nocturia   . Peripheral neuropathy    takes gabapentin daily  . Pneumonia    "at least twice" (02/28/2018)  . Shortness of breath dyspnea    rarely but if notices it then with exertion  . Syphilis, unspecified   . Type II diabetes mellitus ([place]) 2004   Lantus daily.Average fasting blood sugar 125-199  . Wears glasses   . Wears partial dentures    Past Surgical History:  Procedure Laterality Date  . AV FISTULA PLACEMENT Left 08/02/2018   Procedure: ARTERIOVENOUS (AV)  FISTULA CREATION  left arm radiocephlic;  Surgeon: Marty Heck, MD;  Location: [facility];  Service: Vascular;  Laterality: Left;  . BACK SURGERY    . CARDIAC CATHETERIZATION  10/2002; 12/19/2004   [name] 03/08/2011  . COLONOSCOPY  2013   Dr.John Henrene Pastor   . CORONARY ARTERY BYPASS GRAFT  02/24/2003   CABG X2/notes 03/08/2011  . ESOPHAGOGASTRODUODENOSCOPY (EGD) WITH PROPOFOL N/A 02/08/2019   Procedure: ESOPHAGOGASTRODUODENOSCOPY (EGD) WITH PROPOFOL;  Surgeon: Milus Banister, MD;  Location: [facility];  Service: Gastroenterology;  Laterality: N/A;  . FRACTURE SURGERY    . HOT HEMOSTASIS N/A 02/08/2019   Procedure: HOT HEMOSTASIS (ARGON PLASMA COAGULATION/BICAP);  Surgeon: Milus Banister, MD;  Location: [facility] ENDOSCOPY;  Service: Gastroenterology;  Laterality: N/A;  . INTERTROCHANTERIC HIP FRACTURE SURGERY Left 11/2006   [name] 03/08/2011  . LAPAROSCOPIC CHOLECYSTECTOMY  05/2006  . LIGATION OF COMPETING BRANCHES OF ARTERIOVENOUS FISTULA Left 11/05/2018   Procedure: LIGATION OF COMPETING BRANCHES OF ARTERIOVENOUS FISTULA  LEFT  ARM;  Surgeon: Marty Heck, MD;  Location: [facility];  Service: Vascular;  Laterality: Left;  . LUMBAR LAMINECTOMY/DECOMPRESSION MICRODISCECTOMY N/A 02/29/2016   Procedure: Left L4-5 Lateral Recess Decompression, Removal Extradural Intraspinal Facet Cyst;  Surgeon: Marybelle Killings, MD;  Location: [facility];  Service: Orthopedics;  Laterality: N/A;  . MULTIPLE TOOTH EXTRACTIONS    . ORIF MANDIBULAR FRACTURE Left 08/13/2004   ORIF of left body fracture mandible with KLS Martin 2.3-mm six hole/notes 03/08/2011   Social History:   reports that he has been smoking cigarettes. He has a 43.00 pack-year smoking history. He has never used smokeless tobacco. He reports current alcohol use of about 12.0 standard drinks of alcohol per week. He reports that he does not use drugs.  Family History  Problem Relation Age of Onset  . Heart failure Father   . Hypertension Father   . Diabetes  Brother   . Heart attack Brother   . Alzheimer's disease Mother   . Stroke Sister   . Diabetes Sister   . Alzheimer's disease Sister   . Hypertension Brother   . Diabetes Brother   . Drug abuse Brother   . Colon cancer Neg Hx     Medications: Patient's Medications  New Prescriptions   No medications on file  Previous Medications   ACETAMINOPHEN (TYLENOL) 325 MG TABLET    Take 975 mg by mouth every 6 (six) hours as needed.   ALLOPURINOL (ZYLOPRIM) 100 MG TABLET    Take 1 tablet (100 mg total) by mouth daily.   ASPIRIN EC 81 MG TABLET    Take 81 mg by mouth. Every 3 days   ATORVASTATIN (LIPITOR) 80 MG TABLET    Take 1 tablet (80 mg total) by mouth daily.   B-D UF III MINI PEN NEEDLES 31G X 5 MM MISC    USE FOUR TIMES DAILY   BIKTARVY 50-200-25 MG TABS TABLET    TAKE  1 TABLET BY MOUTH DAILY   BLOOD GLUCOSE MONITORING SUPPL (ONE TOUCH ULTRA MINI) W/DEVICE KIT    1 each by Does not apply route 2 (two) times daily. Dx: E11.40   CALCITRIOL (ROCALTROL) 0.25 MCG CAPSULE    Take 1 capsule by mouth daily.   CARVEDILOL (COREG) 25 MG TABLET    Take 1 tablet (25 mg total) by mouth 2 (two) times daily with a meal.   CHOLECALCIFEROL (VITAMIN D3) 50 MCG (2000 UT) TABS    Take 2,000 Units by mouth daily.   CHOLINE FENOFIBRATE (FENOFIBRIC ACID) 135 MG CPDR    TAKE 1 CAPSULE BY MOUTH DAILY   COLCHICINE 0.6 MG TABLET    Take one tablet by mouth as needed for gout   FUROSEMIDE (LASIX) 80 MG TABLET    Take 160 mg by mouth 3 (three) times daily.    GABAPENTIN (NEURONTIN) 300 MG CAPSULE    TAKE 1 CAPSULE BY MOUTH THREE TIMES DAILY   GLUCOSAMINE-CHONDROIT-VIT C-MN (GLUCOSAMINE CHONDR 1500 COMPLX PO)    Take 2 tablets by mouth daily.    GLUCOSE BLOOD TEST STRIP    Use to test blood sugar three times daily. E11.40   HYDRALAZINE (APRESOLINE) 25 MG TABLET    TAKE 1 TABLET(25 MG) BY MOUTH THREE TIMES DAILY   INSULIN GLARGINE, 2 UNIT DIAL, (TOUJEO MAX SOLOSTAR) 300 UNIT/ML SOPN    Inject 88 Units into the skin  daily.   INSULIN LISPRO (HUMALOG) 100 UNIT/ML KWIKPEN    Inject 0.2 mLs (20 Units total) into the skin 3 (three) times daily.   ISOSORBIDE MONONITRATE (IMDUR) 30 MG 24 HR TABLET    TAKE 1 TABLET(30 MG) BY MOUTH DAILY   LANCETS (ONETOUCH DELICA PLUS [id number]) MISC    1 each by Does not apply route 3 (three) times daily. Use to test blood sugar three times daily. Dx: E11.40   LATANOPROST (XALATAN) 0.005 % OPHTHALMIC SOLUTION    Place 1 drop into both eyes at bedtime. Reported on 04/04/2016   NITROGLYCERIN (NITROSTAT) 0.3 MG SL TABLET    ONE TABLET UNDER TONGUE AS NEEDED FOR CHEST PAIN   PANTOPRAZOLE (PROTONIX) 40 MG TABLET    Take 1 tablet (40 mg total) by mouth daily.   SODIUM BICARBONATE 650 MG TABLET    Take 650 mg by mouth 2 (two) times daily.    TIMOLOL (TIMOPTIC) 0.5 % OPHTHALMIC SOLUTION    Place 1 drop into both eyes every morning.   UMECLIDINIUM-VILANTEROL (ANORO ELLIPTA) 62.5-25 MCG/INH AEPB    INHALE 1 PUFF BY MOUTH EVERY DAY  Modified Medications   No medications on file  Discontinued Medications   No medications on file    Physical Exam:  Vitals:   06/28/19 0921  BP: 120/70  Pulse: 89  Temp: 98.7 F (37.1 C)  TempSrc: Temporal  SpO2: 97%  Weight: 207 lb 12.8 oz (94.3 kg)  Height: _0  (1.88 m)   Body mass index is 26.68 kg/m. Wt Readings from Last 3 Encounters:  06/28/19 207 lb 12.8 oz (94.3 kg)  06/28/19 207 lb 12.8 oz (94.3 kg)  06/14/19 205 lb (93 kg)    Physical Exam Constitutional:      Appearance: He is well-developed.  Eyes:     General: No scleral icterus.    Pupils: Pupils are equal, round, and reactive to light.  Neck:     Musculoskeletal: Neck supple.     Thyroid: No thyromegaly.     Vascular: No carotid bruit.  Cardiovascular:     Rate and Rhythm: Normal rate and regular rhythm.     Heart sounds: Murmur present. No friction rub. No gallop.   Pulmonary:     Effort: Pulmonary effort is normal.     Breath sounds: Normal breath sounds. No  wheezing or rales.  Abdominal:     General: Bowel sounds are normal. There is no abdominal bruit.     Palpations: Abdomen is soft. There is no hepatomegaly or pulsatile mass.  Lymphadenopathy:     Cervical: No cervical adenopathy.  Skin:    General: Skin is warm and dry.     Findings: No rash.  Neurological:     Mental Status: He is alert and oriented to person, place, and time.     Deep Tendon Reflexes: Reflexes are normal and symmetric.  Psychiatric:        Behavior: Behavior normal.        Thought Content: Thought content normal.        Judgment: Judgment normal.     Labs reviewed: Basic Metabolic Panel: Recent Labs    02/07/19 0642  02/09/19 0518 03/08/19 06/24/19 1027  NA 140  --  139 142  142 141  K 4.5   < > 4.7 4.5  4.5 4.3  CL 107  --  108  --  108  CO2 19*  --  20*  --  23  GLUCOSE 147*  --  163*  --  128*  BUN 98*  --  91* 70*  70* 67*  CREATININE 5.63*  --  5.08* 5.2*  5.2* 4.94*  CALCIUM 9.1  --  8.9  --  9.5   < > = values in this interval not displayed.   Liver Function Tests: Recent Labs    02/06/19 1045 02/07/19 0642 06/24/19 1027  AST _0 ALT _1 ALKPHOS 36* 40  --   BILITOT 0.5 0.9 0.4  PROT 7.1 7.6 7.8  ALBUMIN 3.3* 3.4*  --    Recent Labs    02/06/19 1045  LIPASE 110*   No results for input(s): AMMONIA in the last 8760 hours. CBC: Recent Labs    02/06/19 1045  02/08/19 0509 02/09/19 0518  03/08/19  04/25/19 0954 05/09/19 0952 06/24/19 1027  WBC 5.8   < > 7.0 5.9  --  6.1  6.1  --   --   --  5.7  NEUTROABS 2.7  --   --   --   --   --   --   --   --   --   HGB 6.4*   < > 8.0* 8.1*   < > 10.3*  10.3*   < > 13.8 13.8 11.2*  HCT 20.3*   < > 26.1* 25.8*  --  32*  32*  --   --   --  33.5*  MCV 96.2   < > 94.9 93.5  --   --   --   --   --  95.4  PLT 199   < > 195 211  --  19*  213  --   --   --  187   < > = values in this interval not displayed.   Lipid Panel: Recent Labs    11/29/18 0933 03/08/19 06/04/19  1019  CHOL 183 115  115 158  HDL 33* 18*  18* 19*  LDLCALC 114*  --   --   TRIG 240* 674*  674* 574*  CHOLHDL 5.5*  --  8.3*   TSH: No results for input(s): TSH in the last 8760 hours. A1C: Lab Results  Component Value Date   HGBA1C 9.2 (H) 06/04/2019     Assessment/Plan 1. Type 2 diabetes mellitus with diabetic neuropathy, with long-term current use of insulin ([place]) -has made improvements in his diet, also has appt with nutritionist scheduled  -continues to work on increasing exercise. Stated that after he walked blood sugar was 84. No signs of hypoglycemia.  -will continue toujeo with diet and exercise.  - Insulin Glargine, 2 Unit Dial, (TOUJEO MAX SOLOSTAR) 300 UNIT/ML SOPN; Inject 88 Units into the skin daily.  Dispense: 12 pen; Refill: 3 -to keep follow up as scheduled with labs prior sooner if needed   Next appt: 06/28/2019 [name] K. Dwight, [facility] [phone]

## 2019-07-08 ENCOUNTER — Encounter: Payer: Self-pay | Admitting: Internal Medicine

## 2019-07-08 ENCOUNTER — Ambulatory Visit (INDEPENDENT_AMBULATORY_CARE_PROVIDER_SITE_OTHER): Payer: Medicare Other | Admitting: Internal Medicine

## 2019-07-08 ENCOUNTER — Other Ambulatory Visit: Payer: Self-pay

## 2019-07-08 DIAGNOSIS — B20 Human immunodeficiency virus [HIV] disease: Secondary | ICD-10-CM | POA: Diagnosis not present

## 2019-07-08 MED ORDER — BIKTARVY 50-200-25 MG PO TABS: 1.0000 | ORAL_TABLET | Freq: Every day | ORAL | 11 refills | Status: DC

## 2019-07-08 NOTE — Progress Notes (Signed)
Virtual Visit via Telephone Note  I connected with Jacob [name] on 07/08/19 at 10:30 AM EDT by telephone and verified that I am speaking with the correct person using two identifiers.  Location: Patient: Home Provider: [facility]   I discussed the limitations, risks, security and privacy concerns of performing an evaluation and management service by telephone and the availability of in person appointments. I also discussed with the patient that there may be a patient responsible charge related to this service. The patient expressed understanding and agreed to proceed.   History of Present Illness: I spoke to Jacob [name] by phone today.  He has not had any problems obtaining, taking or tolerating his Biktarvy and does not recall missing any doses.  He says that he is feeling a little bit under the weather over the past few days.  He has some chest congestion.  He has not had any fever or shortness of breath.  He has had no change in his sense of smell.   Observations/Objective: HIV 1 RNA Quant (copies/mL)  Date Value  06/24/2019 <20 NOT DETECTED  06/18/2018 <20 DETECTED (A)  04/25/2018 <20 DETECTED (A)   CD4 T Cell Abs (/uL)  Date Value  06/24/2019 927  06/18/2018 1,220  12/12/2017 860   HIV 1 RNA Quant (copies/mL)  Date Value  06/24/2019 <20 NOT DETECTED  06/18/2018 <20 DETECTED (A)  04/25/2018 <20 DETECTED (A)   CD4 T Cell Abs (/uL)  Date Value  06/24/2019 927  06/18/2018 1,220  12/12/2017 860    Assessment and Plan: His infection remains under excellent, long-term control.  Follow Up Instructions: Continue Biktarvy Follow-up after lab work in 1 year   I discussed the assessment and treatment plan with the patient. The patient was provided an opportunity to ask questions and all were answered. The patient agreed with the plan and demonstrated an understanding of the instructions.   The patient was advised to call back or seek an in-person evaluation if the symptoms worsen or if  the condition fails to improve as anticipated.  I provided 8 minutes of non-face-to-face time during this encounter.   [name], MD

## 2019-07-10 ENCOUNTER — Ambulatory Visit: Payer: Medicare Other | Admitting: Registered"

## 2019-07-12 ENCOUNTER — Other Ambulatory Visit: Payer: Self-pay | Admitting: Internal Medicine

## 2019-07-15 ENCOUNTER — Other Ambulatory Visit: Payer: Self-pay | Admitting: Nurse Practitioner

## 2019-07-15 DIAGNOSIS — M109 Gout, unspecified: Secondary | ICD-10-CM

## 2019-07-21 ENCOUNTER — Inpatient Hospital Stay (HOSPITAL_COMMUNITY)
Admission: EM | Admit: 2019-07-21 | Discharge: 2019-08-01 | DRG: 252 | Disposition: A | Discharge status: Home / Self Care | Payer: Medicare Other | Attending: Family Medicine | Admitting: Family Medicine

## 2019-07-21 ENCOUNTER — Other Ambulatory Visit: Payer: Self-pay

## 2019-07-21 ENCOUNTER — Encounter (HOSPITAL_COMMUNITY): Payer: Self-pay | Admitting: Emergency Medicine

## 2019-07-21 ENCOUNTER — Emergency Department (HOSPITAL_COMMUNITY): Payer: Medicare Other

## 2019-07-21 DIAGNOSIS — K31819 Angiodysplasia of stomach and duodenum without bleeding: Secondary | ICD-10-CM | POA: Diagnosis present

## 2019-07-21 DIAGNOSIS — Z20828 Contact with and (suspected) exposure to other viral communicable diseases: Secondary | ICD-10-CM | POA: Diagnosis present

## 2019-07-21 DIAGNOSIS — J189 Pneumonia, unspecified organism: Secondary | ICD-10-CM | POA: Diagnosis not present

## 2019-07-21 DIAGNOSIS — J449 Chronic obstructive pulmonary disease, unspecified: Secondary | ICD-10-CM | POA: Diagnosis present

## 2019-07-21 DIAGNOSIS — K219 Gastro-esophageal reflux disease without esophagitis: Secondary | ICD-10-CM | POA: Diagnosis present

## 2019-07-21 DIAGNOSIS — I4510 Unspecified right bundle-branch block: Secondary | ICD-10-CM | POA: Diagnosis present

## 2019-07-21 DIAGNOSIS — N185 Chronic kidney disease, stage 5: Secondary | ICD-10-CM | POA: Diagnosis not present

## 2019-07-21 DIAGNOSIS — E1122 Type 2 diabetes mellitus with diabetic chronic kidney disease: Secondary | ICD-10-CM | POA: Diagnosis present

## 2019-07-21 DIAGNOSIS — M109 Gout, unspecified: Secondary | ICD-10-CM | POA: Diagnosis present

## 2019-07-21 DIAGNOSIS — T82318A Breakdown (mechanical) of other vascular grafts, initial encounter: Secondary | ICD-10-CM | POA: Diagnosis not present

## 2019-07-21 DIAGNOSIS — F17210 Nicotine dependence, cigarettes, uncomplicated: Secondary | ICD-10-CM | POA: Diagnosis present

## 2019-07-21 DIAGNOSIS — I770 Arteriovenous fistula, acquired: Secondary | ICD-10-CM | POA: Diagnosis not present

## 2019-07-21 DIAGNOSIS — I252 Old myocardial infarction: Secondary | ICD-10-CM

## 2019-07-21 DIAGNOSIS — Z8719 Personal history of other diseases of the digestive system: Secondary | ICD-10-CM

## 2019-07-21 DIAGNOSIS — Z8249 Family history of ischemic heart disease and other diseases of the circulatory system: Secondary | ICD-10-CM

## 2019-07-21 DIAGNOSIS — Z794 Long term (current) use of insulin: Secondary | ICD-10-CM

## 2019-07-21 DIAGNOSIS — R05 Cough: Secondary | ICD-10-CM

## 2019-07-21 DIAGNOSIS — I509 Heart failure, unspecified: Secondary | ICD-10-CM | POA: Diagnosis present

## 2019-07-21 DIAGNOSIS — I214 Non-ST elevation (NSTEMI) myocardial infarction: Secondary | ICD-10-CM | POA: Diagnosis present

## 2019-07-21 DIAGNOSIS — E871 Hypo-osmolality and hyponatremia: Secondary | ICD-10-CM | POA: Diagnosis not present

## 2019-07-21 DIAGNOSIS — R509 Fever, unspecified: Secondary | ICD-10-CM

## 2019-07-21 DIAGNOSIS — I5043 Acute on chronic combined systolic (congestive) and diastolic (congestive) heart failure: Secondary | ICD-10-CM | POA: Diagnosis present

## 2019-07-21 DIAGNOSIS — J440 Chronic obstructive pulmonary disease with acute lower respiratory infection: Secondary | ICD-10-CM | POA: Diagnosis not present

## 2019-07-21 DIAGNOSIS — I132 Hypertensive heart and chronic kidney disease with heart failure and with stage 5 chronic kidney disease, or end stage renal disease: Secondary | ICD-10-CM | POA: Diagnosis present

## 2019-07-21 DIAGNOSIS — I25110 Atherosclerotic heart disease of native coronary artery with unstable angina pectoris: Secondary | ICD-10-CM | POA: Diagnosis present

## 2019-07-21 DIAGNOSIS — Z820 Family history of epilepsy and other diseases of the nervous system: Secondary | ICD-10-CM

## 2019-07-21 DIAGNOSIS — D509 Iron deficiency anemia, unspecified: Secondary | ICD-10-CM | POA: Diagnosis present

## 2019-07-21 DIAGNOSIS — D631 Anemia in chronic kidney disease: Secondary | ICD-10-CM | POA: Diagnosis present

## 2019-07-21 DIAGNOSIS — I161 Hypertensive emergency: Secondary | ICD-10-CM

## 2019-07-21 DIAGNOSIS — I2510 Atherosclerotic heart disease of native coronary artery without angina pectoris: Secondary | ICD-10-CM | POA: Diagnosis present

## 2019-07-21 DIAGNOSIS — N186 End stage renal disease: Secondary | ICD-10-CM

## 2019-07-21 DIAGNOSIS — Z823 Family history of stroke: Secondary | ICD-10-CM

## 2019-07-21 DIAGNOSIS — I255 Ischemic cardiomyopathy: Secondary | ICD-10-CM | POA: Diagnosis present

## 2019-07-21 DIAGNOSIS — Z7982 Long term (current) use of aspirin: Secondary | ICD-10-CM

## 2019-07-21 DIAGNOSIS — T82898A Other specified complication of vascular prosthetic devices, implants and grafts, initial encounter: Secondary | ICD-10-CM | POA: Diagnosis not present

## 2019-07-21 DIAGNOSIS — E785 Hyperlipidemia, unspecified: Secondary | ICD-10-CM | POA: Diagnosis present

## 2019-07-21 DIAGNOSIS — E1169 Type 2 diabetes mellitus with other specified complication: Secondary | ICD-10-CM | POA: Diagnosis present

## 2019-07-21 DIAGNOSIS — Z992 Dependence on renal dialysis: Secondary | ICD-10-CM

## 2019-07-21 DIAGNOSIS — N2581 Secondary hyperparathyroidism of renal origin: Secondary | ICD-10-CM | POA: Diagnosis present

## 2019-07-21 DIAGNOSIS — R140 Abdominal distension (gaseous): Secondary | ICD-10-CM | POA: Diagnosis not present

## 2019-07-21 DIAGNOSIS — R059 Cough, unspecified: Secondary | ICD-10-CM

## 2019-07-21 DIAGNOSIS — R69 Illness, unspecified: Secondary | ICD-10-CM | POA: Diagnosis not present

## 2019-07-21 DIAGNOSIS — Z79899 Other long term (current) drug therapy: Secondary | ICD-10-CM

## 2019-07-21 DIAGNOSIS — B20 Human immunodeficiency virus [HIV] disease: Secondary | ICD-10-CM | POA: Diagnosis present

## 2019-07-21 DIAGNOSIS — M79642 Pain in left hand: Secondary | ICD-10-CM | POA: Diagnosis not present

## 2019-07-21 DIAGNOSIS — Z87442 Personal history of urinary calculi: Secondary | ICD-10-CM

## 2019-07-21 DIAGNOSIS — F1410 Cocaine abuse, uncomplicated: Secondary | ICD-10-CM

## 2019-07-21 DIAGNOSIS — E1140 Type 2 diabetes mellitus with diabetic neuropathy, unspecified: Secondary | ICD-10-CM

## 2019-07-21 DIAGNOSIS — I5033 Acute on chronic diastolic (congestive) heart failure: Secondary | ICD-10-CM | POA: Diagnosis not present

## 2019-07-21 DIAGNOSIS — J9601 Acute respiratory failure with hypoxia: Secondary | ICD-10-CM | POA: Diagnosis present

## 2019-07-21 DIAGNOSIS — Z833 Family history of diabetes mellitus: Secondary | ICD-10-CM

## 2019-07-21 DIAGNOSIS — Z951 Presence of aortocoronary bypass graft: Secondary | ICD-10-CM

## 2019-07-21 LAB — I-STAT CHEM 8, ED
BUN: 68 mg/dL — ABNORMAL HIGH (ref 8–23)
Calcium, Ion: 1.18 mmol/L (ref 1.15–1.40)
Chloride: 109 mmol/L (ref 98–111)
Creatinine, Ser: 5.6 mg/dL — ABNORMAL HIGH (ref 0.61–1.24)
Glucose, Bld: 100 mg/dL — ABNORMAL HIGH (ref 70–99)
HCT: 31 % — ABNORMAL LOW (ref 39.0–52.0)
Hemoglobin: 10.5 g/dL — ABNORMAL LOW (ref 13.0–17.0)
Potassium: 4.6 mmol/L (ref 3.5–5.1)
Sodium: 141 mmol/L (ref 135–145)
TCO2: 23 mmol/L (ref 22–32)

## 2019-07-21 LAB — CBC WITH DIFFERENTIAL/PLATELET
Abs Immature Granulocytes: 0.04 10*3/uL (ref 0.00–0.07)
Basophils Absolute: 0 10*3/uL (ref 0.0–0.1)
Basophils Relative: 0 %
Eosinophils Absolute: 0.1 10*3/uL (ref 0.0–0.5)
Eosinophils Relative: 1 %
HCT: 28.9 % — ABNORMAL LOW (ref 39.0–52.0)
Hemoglobin: 9.7 g/dL — ABNORMAL LOW (ref 13.0–17.0)
Immature Granulocytes: 1 %
Lymphocytes Relative: 12 %
Lymphs Abs: 0.9 10*3/uL (ref 0.7–4.0)
MCH: 33.7 pg (ref 26.0–34.0)
MCHC: 33.6 g/dL (ref 30.0–36.0)
MCV: 100.3 fL — ABNORMAL HIGH (ref 80.0–100.0)
Monocytes Absolute: 0.6 10*3/uL (ref 0.1–1.0)
Monocytes Relative: 9 %
Neutro Abs: 5.6 10*3/uL (ref 1.7–7.7)
Neutrophils Relative %: 77 %
Platelets: 200 10*3/uL (ref 150–400)
RBC: 2.88 MIL/uL — ABNORMAL LOW (ref 4.22–5.81)
RDW: 15 % (ref 11.5–15.5)
WBC: 7.2 10*3/uL (ref 4.0–10.5)
nRBC: 0 % (ref 0.0–0.2)

## 2019-07-21 LAB — COMPREHENSIVE METABOLIC PANEL
ALT: 51 U/L — ABNORMAL HIGH (ref 0–44)
AST: 95 U/L — ABNORMAL HIGH (ref 15–41)
Albumin: 3.2 g/dL — ABNORMAL LOW (ref 3.5–5.0)
Alkaline Phosphatase: 42 U/L (ref 38–126)
Anion gap: 9 (ref 5–15)
BUN: 72 mg/dL — ABNORMAL HIGH (ref 8–23)
CO2: 23 mmol/L (ref 22–32)
Calcium: 9 mg/dL (ref 8.9–10.3)
Chloride: 108 mmol/L (ref 98–111)
Creatinine, Ser: 5.55 mg/dL — ABNORMAL HIGH (ref 0.61–1.24)
GFR calc Af Amer: 11 mL/min — ABNORMAL LOW (ref >60)
GFR calc non Af Amer: 10 mL/min — ABNORMAL LOW (ref >60)
Glucose, Bld: 110 mg/dL — ABNORMAL HIGH (ref 70–99)
Potassium: 4.5 mmol/L (ref 3.5–5.1)
Sodium: 140 mmol/L (ref 135–145)
Total Bilirubin: 0.7 mg/dL (ref 0.3–1.2)
Total Protein: 7.7 g/dL (ref 6.5–8.1)

## 2019-07-21 LAB — RENAL FUNCTION PANEL
Albumin: 2.9 g/dL — ABNORMAL LOW (ref 3.5–5.0)
Anion gap: 10 (ref 5–15)
BUN: 73 mg/dL — ABNORMAL HIGH (ref 8–23)
CO2: 21 mmol/L — ABNORMAL LOW (ref 22–32)
Calcium: 8.6 mg/dL — ABNORMAL LOW (ref 8.9–10.3)
Chloride: 104 mmol/L (ref 98–111)
Creatinine, Ser: 5.71 mg/dL — ABNORMAL HIGH (ref 0.61–1.24)
GFR calc Af Amer: 11 mL/min — ABNORMAL LOW (ref >60)
GFR calc non Af Amer: 9 mL/min — ABNORMAL LOW (ref >60)
Glucose, Bld: 152 mg/dL — ABNORMAL HIGH (ref 70–99)
Phosphorus: 4.5 mg/dL (ref 2.5–4.6)
Potassium: 4.4 mmol/L (ref 3.5–5.1)
Sodium: 135 mmol/L (ref 135–145)

## 2019-07-21 LAB — PROTIME-INR
INR: 1.1 (ref 0.8–1.2)
Prothrombin Time: 13.8 seconds (ref 11.4–15.2)

## 2019-07-21 LAB — TROPONIN I (HIGH SENSITIVITY)
Troponin I (High Sensitivity): 284 ng/L (ref <18)
Troponin I (High Sensitivity): 585 ng/L (ref <18)
Troponin I (High Sensitivity): 899 ng/L (ref <18)

## 2019-07-21 LAB — SARS CORONAVIRUS 2 BY RT PCR (HOSPITAL ORDER, PERFORMED IN ~~LOC~~ HOSPITAL LAB): SARS Coronavirus 2: NEGATIVE

## 2019-07-21 LAB — GLUCOSE, CAPILLARY: Glucose-Capillary: 119 mg/dL — ABNORMAL HIGH (ref 70–99)

## 2019-07-21 LAB — PHOSPHORUS: Phosphorus: 5.1 mg/dL — ABNORMAL HIGH (ref 2.5–4.6)

## 2019-07-21 LAB — ETHANOL: Alcohol, Ethyl (B): 14 mg/dL — ABNORMAL HIGH (ref <10)

## 2019-07-21 LAB — HEPARIN LEVEL (UNFRACTIONATED): Heparin Unfractionated: 0.12 IU/mL — ABNORMAL LOW (ref 0.30–0.70)

## 2019-07-21 LAB — BRAIN NATRIURETIC PEPTIDE: B Natriuretic Peptide: 1535.2 pg/mL — ABNORMAL HIGH (ref 0.0–100.0)

## 2019-07-21 LAB — MAGNESIUM: Magnesium: 1.8 mg/dL (ref 1.7–2.4)

## 2019-07-21 MED ORDER — NITROGLYCERIN IN D5W 200-5 MCG/ML-% IV SOLN: 0.0000 ug/min | INTRAVENOUS | Status: DC

## 2019-07-21 MED ORDER — ACETAMINOPHEN 650 MG RE SUPP: 650.0000 mg | Freq: Four times a day (QID) | RECTAL | Status: DC | PRN

## 2019-07-21 MED ORDER — ASPIRIN EC 81 MG PO TBEC
81.0000 mg | DELAYED_RELEASE_TABLET | Freq: Every day | ORAL | Status: DC
  Administered 2019-07-22 – 2019-08-01 (×9): 81 mg via ORAL
  Filled 2019-07-21 (×12): qty 1

## 2019-07-21 MED ORDER — HYDRALAZINE HCL 25 MG PO TABS
25.0000 mg | ORAL_TABLET | Freq: Three times a day (TID) | ORAL | Status: DC
  Administered 2019-07-21 – 2019-08-01 (×23): 25 mg via ORAL
  Filled 2019-07-21 (×29): qty 1

## 2019-07-21 MED ORDER — IPRATROPIUM BROMIDE HFA 17 MCG/ACT IN AERS
4.0000 | INHALATION_SPRAY | Freq: Once | RESPIRATORY_TRACT | Status: DC
  Filled 2019-07-21: qty 12.9

## 2019-07-21 MED ORDER — INSULIN ASPART 100 UNIT/ML ~~LOC~~ SOLN
0.0000 [IU] | Freq: Three times a day (TID) | SUBCUTANEOUS | Status: DC
  Administered 2019-07-22: 2 [IU] via SUBCUTANEOUS
  Administered 2019-07-23 (×2): 3 [IU] via SUBCUTANEOUS
  Administered 2019-07-23: 06:00:00 1 [IU] via SUBCUTANEOUS
  Administered 2019-07-24 – 2019-07-26 (×3): 2 [IU] via SUBCUTANEOUS
  Administered 2019-07-26: 1 [IU] via SUBCUTANEOUS
  Administered 2019-07-27 (×2): 2 [IU] via SUBCUTANEOUS
  Administered 2019-07-27 – 2019-07-28 (×3): 3 [IU] via SUBCUTANEOUS
  Administered 2019-07-28: 2 [IU] via SUBCUTANEOUS
  Administered 2019-07-29: 1 [IU] via SUBCUTANEOUS
  Administered 2019-07-29: 3 [IU] via SUBCUTANEOUS
  Administered 2019-07-30: 1 [IU] via SUBCUTANEOUS
  Administered 2019-07-30: 3 [IU] via SUBCUTANEOUS
  Administered 2019-07-30 – 2019-07-31 (×2): 2 [IU] via SUBCUTANEOUS
  Administered 2019-07-31: 1 [IU] via SUBCUTANEOUS
  Administered 2019-08-01: 13:00:00 2 [IU] via SUBCUTANEOUS
  Administered 2019-08-01: 1 [IU] via SUBCUTANEOUS

## 2019-07-21 MED ORDER — HEPARIN BOLUS VIA INFUSION
2000.0000 [IU] | Freq: Once | INTRAVENOUS | Status: AC
  Administered 2019-07-21: 2000 [IU] via INTRAVENOUS
  Filled 2019-07-21: qty 2000

## 2019-07-21 MED ORDER — HEPARIN (PORCINE) 25000 UT/250ML-% IV SOLN
2550.0000 [IU]/h | INTRAVENOUS | Status: DC
  Administered 2019-07-21: 13:00:00 1250 [IU]/h via INTRAVENOUS
  Administered 2019-07-22: 21:00:00 1850 [IU]/h via INTRAVENOUS
  Administered 2019-07-22: 1450 [IU]/h via INTRAVENOUS
  Administered 2019-07-23: 2000 [IU]/h via INTRAVENOUS
  Administered 2019-07-24: 2200 [IU]/h via INTRAVENOUS
  Filled 2019-07-21 (×8): qty 250

## 2019-07-21 MED ORDER — FUROSEMIDE 10 MG/ML IJ SOLN
160.0000 mg | Freq: Once | INTRAVENOUS | Status: AC
  Administered 2019-07-22: 160 mg via INTRAVENOUS
  Filled 2019-07-21: qty 16

## 2019-07-21 MED ORDER — ALBUTEROL SULFATE HFA 108 (90 BASE) MCG/ACT IN AERS
8.0000 | INHALATION_SPRAY | Freq: Once | RESPIRATORY_TRACT | Status: AC
  Administered 2019-07-21: 09:00:00 8 via RESPIRATORY_TRACT

## 2019-07-21 MED ORDER — HEPARIN BOLUS VIA INFUSION
4000.0000 [IU] | Freq: Once | INTRAVENOUS | Status: AC
  Administered 2019-07-21: 4000 [IU] via INTRAVENOUS
  Filled 2019-07-21: qty 4000

## 2019-07-21 MED ORDER — FUROSEMIDE 10 MG/ML IJ SOLN: 80.0000 mg | Freq: Once | INTRAMUSCULAR | Status: DC

## 2019-07-21 MED ORDER — FUROSEMIDE 10 MG/ML IJ SOLN
120.0000 mg | Freq: Once | INTRAVENOUS | Status: AC
  Administered 2019-07-21: 16:00:00 120 mg via INTRAVENOUS
  Filled 2019-07-21: qty 10

## 2019-07-21 MED ORDER — GABAPENTIN 300 MG PO CAPS
300.0000 mg | ORAL_CAPSULE | Freq: Three times a day (TID) | ORAL | Status: DC
  Administered 2019-07-21 – 2019-07-22 (×5): 300 mg via ORAL
  Filled 2019-07-21 (×6): qty 1

## 2019-07-21 MED ORDER — FUROSEMIDE 10 MG/ML IJ SOLN
60.0000 mg | Freq: Once | INTRAMUSCULAR | Status: AC
  Administered 2019-07-21: 13:00:00 60 mg via INTRAVENOUS
  Filled 2019-07-21: qty 6

## 2019-07-21 MED ORDER — HYDRALAZINE HCL 20 MG/ML IJ SOLN
20.0000 mg | Freq: Once | INTRAMUSCULAR | Status: AC
  Administered 2019-07-21: 13:00:00 20 mg via INTRAVENOUS
  Filled 2019-07-21: qty 1

## 2019-07-21 MED ORDER — ASPIRIN 81 MG PO CHEW
324.0000 mg | CHEWABLE_TABLET | Freq: Once | ORAL | Status: AC
  Administered 2019-07-21: 324 mg via ORAL
  Filled 2019-07-21: qty 4

## 2019-07-21 MED ORDER — ACETAMINOPHEN 325 MG PO TABS
650.0000 mg | ORAL_TABLET | Freq: Four times a day (QID) | ORAL | Status: DC | PRN
  Administered 2019-07-23 – 2019-08-01 (×7): 650 mg via ORAL
  Filled 2019-07-21 (×7): qty 2

## 2019-07-21 MED ORDER — MAGNESIUM SULFATE 2 GM/50ML IV SOLN
2.0000 g | Freq: Once | INTRAVENOUS | Status: AC
  Administered 2019-07-21: 2 g via INTRAVENOUS
  Filled 2019-07-21 (×2): qty 50

## 2019-07-21 MED ORDER — NITROGLYCERIN 2 % TD OINT
1.0000 [in_us] | TOPICAL_OINTMENT | Freq: Four times a day (QID) | TRANSDERMAL | Status: DC
  Administered 2019-07-21: 13:00:00 1 [in_us] via TOPICAL
  Filled 2019-07-21: qty 1

## 2019-07-21 MED ORDER — ATORVASTATIN CALCIUM 80 MG PO TABS
80.0000 mg | ORAL_TABLET | Freq: Every day | ORAL | Status: DC
  Administered 2019-07-21 – 2019-07-31 (×11): 80 mg via ORAL
  Filled 2019-07-21 (×12): qty 1

## 2019-07-21 MED ORDER — DICLOFENAC SODIUM 1 % TD GEL
2.0000 g | Freq: Four times a day (QID) | TRANSDERMAL | Status: DC
  Administered 2019-07-21 – 2019-07-22 (×4): 2 g via TOPICAL
  Filled 2019-07-21: qty 100

## 2019-07-21 MED ORDER — SODIUM CHLORIDE 0.9 % IV SOLN
INTRAVENOUS | Status: DC | PRN
  Administered 2019-07-21 – 2019-07-22 (×2): 1000 mL via INTRAVENOUS
  Administered 2019-08-01: 10:00:00 via INTRAVENOUS

## 2019-07-21 MED ORDER — ALLOPURINOL 100 MG PO TABS
100.0000 mg | ORAL_TABLET | Freq: Every day | ORAL | Status: DC
  Administered 2019-07-22 – 2019-08-01 (×8): 100 mg via ORAL
  Filled 2019-07-21 (×11): qty 1

## 2019-07-21 MED ORDER — BICTEGRAVIR-EMTRICITAB-TENOFOV 50-200-25 MG PO TABS
1.0000 | ORAL_TABLET | Freq: Every day | ORAL | Status: DC
  Administered 2019-07-22: 1 via ORAL
  Filled 2019-07-21 (×3): qty 1

## 2019-07-21 MED ORDER — ISOSORBIDE MONONITRATE ER 30 MG PO TB24
30.0000 mg | ORAL_TABLET | Freq: Every day | ORAL | Status: DC
  Administered 2019-07-22 – 2019-08-01 (×9): 30 mg via ORAL
  Filled 2019-07-21 (×13): qty 1

## 2019-07-21 MED ORDER — UMECLIDINIUM-VILANTEROL 62.5-25 MCG/INH IN AEPB
1.0000 | INHALATION_SPRAY | Freq: Every day | RESPIRATORY_TRACT | Status: DC
  Filled 2019-07-21: qty 14

## 2019-07-21 NOTE — ED Provider Notes (Addendum)
[facility] EMERGENCY DEPARTMENT Provider Note   CSN: [id number] Arrival date & time: 07/21/19  4782     History   Chief Complaint Chief Complaint  Patient presents with  . Respiratory Distress    HPI Jacob [name] is a 68 y.o. male.     HPI Patient has complex medical history with multiple comorbidities including history of CHF and coronary artery disease with prior cardiomyopathy, HIV, diabetes.  Patient also has history of drug abuse.  Last cocaine use day before yesterday.  Patient reports that he started to get short of breath in the early hours of the morning.  He reports he was not yet asleep.  He did call EMS and when they got there and evaluated him, he seemed to be doing better.  He reports he was not transported at that time.  He reports he then went to brush his teeth and shortly later became severely short of breath again and called EMS back and was transported.  He reports his main symptom is shortness of breath.  He describes some waxing and waning chest pain.  He had initially reported to the nurse that he had chest pain but then subsequently to me he denied chest pain.  He reports that it had resolved.  He denies lower extremity swelling or calf pain.  He reports he is compliant with his medications.  He reports that he measures his blood pressures every morning before taking his medications.  He describes his pressures is typically running in the 130s over 70s. Past Medical History:  Diagnosis Date  . Acute respiratory failure ([place]) 03/01/2018  . Arthritis    "all over; mostly knees and back" (02/28/2018)  . CHF (congestive heart failure) (HCC)    not on any meds  . Chronic lower back pain    stenosis  . CKD (chronic kidney disease), stage V ([place])    [name] 02/28/2018  . Community acquired pneumonia 09/06/2013  . Coronary atherosclerosis of native coronary artery 2005   s/p surgery  . Drug abuse ([place])    hx; tested for cocaine as recently as 2/08.  says he is not using drugs now - avoided defib. for this reason   . GERD (gastroesophageal reflux disease)    takes OTC meds as needed  . GI bleeding 02/06/2019  . Glaucoma    uses eye drops daily  . Hepatitis B 1968   "tx'd w/isolation; caught it from toilet stools in gym"  . History of blood transfusion 03/01/2019  . History of colon polyps    benign  . History of gout    takes Allopurinol daily as well as Colchicine-if needed (02/28/2018)  . History of kidney stones   . HTN (hypertension)    takes Coreg,Imdur.and Apresoline daily  . Human immunodeficiency virus (HIV) disease ([place]) dx'd 1995   takes Genvoya daily  . Hyperlipidemia    takes Atorvastatin daily  . Ischemic cardiomyopathy   . Muscle spasm    takes Zanaflex as needed  . Myocardial infarction ([place]) ~ 2004/2005  . Nocturia   . Peripheral neuropathy    takes gabapentin daily  . Pneumonia    "at least twice" (02/28/2018)  . Shortness of breath dyspnea    rarely but if notices it then with exertion  . Syphilis, unspecified   . Type II diabetes mellitus ([place]) 2004   Lantus daily.Average fasting blood sugar 125-199  . Wears glasses   . Wears partial dentures  Patient Active Problem List   Diagnosis Date Noted  . Diabetic foot ulcer ([place]) 02/11/2019  . Gastric AVM   . CKD stage 4 due to type 2 diabetes mellitus ([place]) 05/30/2018  . Hyperlipidemia associated with type 2 diabetes mellitus ([place]) 05/30/2018  . Right foot ulcer ([place]) 02/28/2018  . Chronic obstructive pulmonary disease ([place]) 02/28/2018  . Anemia of chronic disease 11/20/2016  . CKD (chronic kidney disease) stage 3, GFR 30-59 ml/min (HCC) 11/20/2016  . CHF (congestive heart failure) ([place]) 11/10/2016  . Pneumonia 08/02/2016  . History of lumbar laminectomy for spinal cord decompression 02/29/2016  . Type 2 diabetes mellitus with vascular disease ([place]) 06/17/2015  . HTN (hypertension) 04/26/2015  . DM (diabetes mellitus) ([place]) 04/26/2015  . Ischemic  cardiomyopathy 05/12/2014  . Ulcer of lower extremity ([place]) 09/06/2013  . Chest pain 09/06/2013  . Type II or unspecified type diabetes mellitus with unspecified complication, not stated as uncontrolled 10/22/2012  . Bunion of left foot 11/25/2011  . Bunion, right foot 11/25/2011  . Polysubstance abuse ([place]) 07/28/2011  . Hip fracture, left ([place]) 04/04/2011  . Closed fracture of neck of femur ([place]) 04/04/2011  . Insomnia 02/18/2011  . CAD (coronary artery disease) 04/20/2009  . Acute on chronic combined systolic (congestive) and diastolic (congestive) heart failure ([place]) 04/20/2009  . HLD (hyperlipidemia) 11/20/2006  . Gout 11/20/2006  . TOBACCO ABUSE 11/20/2006  . Essential hypertension 11/20/2006  . Human immunodeficiency virus (HIV) disease ([place]) 09/02/2006    Past Surgical History:  Procedure Laterality Date  . AV FISTULA PLACEMENT Left 08/02/2018   Procedure: ARTERIOVENOUS (AV) FISTULA CREATION  left arm radiocephlic;  Surgeon: Marty Heck, MD;  Location: [facility];  Service: Vascular;  Laterality: Left;  . BACK SURGERY    . CARDIAC CATHETERIZATION  10/2002; 12/19/2004   [name] 03/08/2011  . COLONOSCOPY  2013   Dr.John Henrene Pastor   . CORONARY ARTERY BYPASS GRAFT  02/24/2003   CABG X2/notes 03/08/2011  . ESOPHAGOGASTRODUODENOSCOPY (EGD) WITH PROPOFOL N/A 02/08/2019   Procedure: ESOPHAGOGASTRODUODENOSCOPY (EGD) WITH PROPOFOL;  Surgeon: Milus Banister, MD;  Location: [facility];  Service: Gastroenterology;  Laterality: N/A;  . FRACTURE SURGERY    . HOT HEMOSTASIS N/A 02/08/2019   Procedure: HOT HEMOSTASIS (ARGON PLASMA COAGULATION/BICAP);  Surgeon: Milus Banister, MD;  [facility] END[facility];  Service: Gastroenterology;  Laterality: N/A;  . INTERTROCHANTERIC HIP FRACTURE SURGERY Left 11/2006   [name] 03/08/2011  . LAPAROSCOPIC CHOLECYSTECTOMY  05/2006  . LIGATION OF COMPETING BRANCHES OF ARTERIOVENOUS FISTULA Left 11/05/2018   Procedure: LIGATION OF COMPETING BRANCHES OF  ARTERIOVENOUS FISTULA  LEFT  ARM;  Surgeon: Marty Heck, MD;  Location: [facility];  Service: Vascular;  Laterality: Left;  . LUMBAR LAMINECTOMY/DECOMPRESSION MICRODISCECTOMY N/A 02/29/2016   Procedure: Left L4-5 Lateral Recess Decompression, Removal Extradural Intraspinal Facet Cyst;  Surgeon: Marybelle Killings, MD;  Location: [facility];  Service: Orthopedics;  Laterality: N/A;  . MULTIPLE TOOTH EXTRACTIONS    . ORIF MANDIBULAR FRACTURE Left 08/13/2004   ORIF of left body fracture mandible with KLS Martin 2.3-mm six hole/notes 03/08/2011        Home Medications    Prior to Admission medications   Medication Sig Start Date End Date Taking? Authorizing Provider  acetaminophen (TYLENOL) 325 MG tablet Take 975 mg by mouth every 6 (six) hours as needed.    [provider]  allopurinol (ZYLOPRIM) 100 MG tablet TAKE 1 TABLET(100 MG) BY MOUTH DAILY 07/15/19   [name], NP  aspirin  EC 81 MG tablet Take 81 mg by mouth. Every 3 days    [provider]  atorvastatin (LIPITOR) 80 MG tablet Take 1 tablet (80 mg total) by mouth daily. 12/05/18   [name], NP  B-D UF III MINI PEN NEEDLES 31G X 5 MM MISC USE FOUR TIMES DAILY 02/25/19   [name], NP  BIKTARVY 50-200-25 MG TABS tablet TAKE 1 TABLET BY MOUTH DAILY 07/12/19   [name], MD  Blood Glucose Monitoring Suppl (ONE TOUCH ULTRA MINI) w/Device KIT 1 each by Does not apply route 2 (two) times daily. Dx: E11.40 08/15/17   Gildardo Cranker, DO  calcitRIOL (ROCALTROL) 0.25 MCG capsule Take 1 capsule by mouth daily. 06/07/19   [provider]  carvedilol (COREG) 25 MG tablet Take 1 tablet (25 mg total) by mouth 2 (two) times daily with a meal. 04/16/19   [name], MD  Cholecalciferol (VITAMIN D3) 50 MCG (2000 UT) TABS Take 2,000 Units by mouth daily.    [provider]  Choline Fenofibrate (FENOFIBRIC ACID) 135 MG CPDR TAKE 1 CAPSULE BY MOUTH DAILY 01/21/19   [name], NP  colchicine 0.6  MG tablet Take one tablet by mouth as needed for gout 04/30/19   [name], NP  furosemide (LASIX) 80 MG tablet Take 160 mg by mouth 3 (three) times daily.     [provider]  gabapentin (NEURONTIN) 300 MG capsule TAKE 1 CAPSULE BY MOUTH THREE TIMES DAILY 07/15/19   [name], NP  Glucosamine-Chondroit-Vit C-Mn (GLUCOSAMINE CHONDR 1500 COMPLX PO) Take 2 tablets by mouth daily.     [provider]  glucose blood test strip Use to test blood sugar three times daily. E11.40 04/15/19   [name], NP  hydrALAZINE (APRESOLINE) 25 MG tablet TAKE 1 TABLET(25 MG) BY MOUTH THREE TIMES DAILY 04/10/19   [name], NP  Insulin Glargine, 2 Unit Dial, (TOUJEO MAX SOLOSTAR) 300 UNIT/ML SOPN Inject 88 Units into the skin daily. 06/28/19   [name], NP  insulin lispro (HUMALOG) 100 UNIT/ML KwikPen Inject 0.2 mLs (20 Units total) into the skin 3 (three) times daily. 06/04/19   [name], NP  isosorbide mononitrate (IMDUR) 30 MG 24 hr tablet TAKE 1 TABLET(30 MG) BY MOUTH DAILY 04/15/19   [name], NP  Lancets [facility] DELICA PLUS [id number]) [place] 1 each by Does not apply route 3 (three) times daily. Use to test blood sugar three times daily. Dx: E11.40 12/05/18   [name], NP  latanoprost (XALATAN) 0.005 % ophthalmic solution Place 1 drop into both eyes at bedtime. Reported on 04/04/2016 04/23/12   [provider]  nitroGLYCERIN (NITROSTAT) 0.3 MG SL tablet ONE TABLET UNDER TONGUE AS NEEDED FOR CHEST PAIN 05/01/19   [name], MD  pantoprazole (PROTONIX) 40 MG tablet Take 1 tablet (40 mg total) by mouth daily. 05/16/19   [name], NP  sodium bicarbonate 650 MG tablet Take 650 mg by mouth 2 (two) times daily.  05/21/18   [provider]  timolol (TIMOPTIC) 0.5 % ophthalmic solution Place 1 drop into both eyes every morning. 04/04/19   [provider]  umeclidinium-vilanterol (ANORO ELLIPTA) 62.5-25  MCG/INH AEPB INHALE 1 PUFF BY MOUTH EVERY DAY 01/15/19   [name], NP    Family History Family History  Problem Relation Age of Onset  . Heart failure Father   . Hypertension Father   . Diabetes  Brother   . Heart attack Brother   . Alzheimer's disease Mother   . Stroke Sister   . Diabetes Sister   . Alzheimer's disease Sister   . Hypertension Brother   . Diabetes Brother   . Drug abuse Brother   . Colon cancer Neg Hx     Social History Social History   Tobacco Use  . Smoking status: Current Every Day Smoker    Packs/day: 1.00    Years: 43.00    Pack years: 43.00    Types: Cigarettes  . Smokeless tobacco: Never Used  Substance Use Topics  . Alcohol use: Yes    Alcohol/week: 12.0 standard drinks    Types: 12 Standard drinks or equivalent per week    Comment: 6x 24oz 8% ABV beers a week  . Drug use: No    Types: Cocaine    Comment: hx of crack/cocaine 46yr ago     Allergies   Augmentin [amoxicillin-pot clavulanate] and Amphetamines   Review of Systems Review of Systems 10 Systems reviewed and are negative for acute change except as noted in the HPI.   Physical Exam Updated Vital Signs BP (!) 155/87   Pulse 87   Temp 98.2 F (36.8 C) (Oral)   Resp (!) 21   SpO2 100%   Physical Exam Constitutional:      Comments: Patient has moderate increased work of breathing.  He is mildly anxious.  Patient slightly diaphoretic on the brow.  Mental status is clear.  HENT:     Head: Normocephalic and atraumatic.     Mouth/Throat:     Mouth: Mucous membranes are moist.     Pharynx: Oropharynx is clear.  Eyes:     Extraocular Movements: Extraocular movements intact.  Neck:     Musculoskeletal: Neck supple.  Cardiovascular:     Comments: Borderline tachycardia.  S3 gallop. Pulmonary:     Comments: Crackles from midlung fields to bases.  I do not appreciate gross wheeze. Abdominal:     General: There is no distension.     Palpations: Abdomen is soft.      Tenderness: There is no abdominal tenderness. There is no guarding.  Musculoskeletal: Normal range of motion.        General: No swelling or tenderness.     Right lower leg: No edema.     Left lower leg: No edema.     Comments: Patient does not have peripheral edema.  Calves are nontender.  Skin:    Comments: Skin is warm.  He does have diaphoresis on the scalp and brow.  He is not diaphoretic generally.  Neurological:     General: No focal deficit present.     Mental Status: He is oriented to person, place, and time.     Cranial Nerves: No cranial nerve deficit.     Coordination: Coordination normal.  Psychiatric:     Comments: On arrival patient is mild to moderately anxious.      ED Treatments / Results  Labs (all labs ordered are listed, but only abnormal results are displayed) Labs Reviewed  COMPREHENSIVE METABOLIC PANEL - Abnormal; Notable for the following components:      Result Value   Glucose, Bld 110 (*)    BUN 72 (*)    Creatinine, Ser 5.55 (*)    Albumin 3.2 (*)    AST 95 (*)    ALT 51 (*)    GFR calc non Af Amer 10 (*)    GFR calc  Af Amer 11 (*)    All other components within normal limits  ETHANOL - Abnormal; Notable for the following components:   Alcohol, Ethyl (B) 14 (*)    All other components within normal limits  BRAIN NATRIURETIC PEPTIDE - Abnormal; Notable for the following components:   B Natriuretic Peptide 1,535.2 (*)    All other components within normal limits  CBC WITH DIFFERENTIAL/PLATELET - Abnormal; Notable for the following components:   RBC 2.88 (*)    Hemoglobin 9.7 (*)    HCT 28.9 (*)    MCV 100.3 (*)    All other components within normal limits  PHOSPHORUS - Abnormal; Notable for the following components:   Phosphorus 5.1 (*)    All other components within normal limits  I-STAT CHEM 8, ED - Abnormal; Notable for the following components:   BUN 68 (*)    Creatinine, Ser 5.60 (*)    Glucose, Bld 100 (*)    Hemoglobin 10.5 (*)     HCT 31.0 (*)    All other components within normal limits  TROPONIN I (HIGH SENSITIVITY) - Abnormal; Notable for the following components:   Troponin I (High Sensitivity) 284 (*)    All other components within normal limits  SARS CORONAVIRUS 2 (HOSPITAL ORDER, [facility])  PROTIME-INR  MAGNESIUM  URINALYSIS, ROUTINE W REFLEX MICROSCOPIC  RAPID URINE DRUG SCREEN, HOSP PERFORMED  TROPONIN I (HIGH SENSITIVITY)    EKG EKG Interpretation  Date/Time:  Sunday July 21 2019 09:46:08 EDT Ventricular Rate:  87 PR Interval:    QRS Duration: 131 QT Interval:  455 QTC Calculation: 548 R Axis:   31 Text Interpretation:  Sinus rhythm Multiple ventricular premature complexes Right bundle branch block Inferior infarct, age indeterminate Abnormal lateral Q waves agree, diffuse ST depression Confirmed by Charlesetta Shanks [phone]) on 07/21/2019 10:47:38 AM   Radiology Dg Chest Port 1 View  Result Date: 07/21/2019 CLINICAL DATA:  Pt arrives via [facility] from home with c/o sob that began a few hours pta, pt also reports new onset of cough this am after he smoked. Ems states O2 sats were in the low 80s upon their arrival, O2 sats improved to 99% on NRB, lung s.*comment was truncated*sob EXAM: PORTABLE CHEST 1 VIEW COMPARISON:  Radiograph 02/06/2019 FINDINGS: Sternotomy wires overlie normal cardiac silhouette. Fine interstitial pattern throughout the lungs suggests interstitial edema. No clear pleural fluid. No focal consolidation. Remote RIGHT rib fractures. IMPRESSION: Interstitial edema pattern. Electronically Signed   By: Suzy Bouchard M.D.   On: 07/21/2019 09:29    Procedures Procedures (including critical care time) CRITICAL CARE Performed by: Charlesetta Shanks   Total critical care time: 45 minutes  Critical care time was exclusive of separately billable procedures and treating other patients.  Critical care was necessary to treat or prevent imminent or  life-threatening deterioration.  Critical care was time spent personally by me on the following activities: development of treatment plan with patient and/or surrogate as well as nursing, discussions with consultants, evaluation of patient's response to treatment, examination of patient, obtaining history from patient or surrogate, ordering and performing treatments and interventions, ordering and review of laboratory studies, ordering and review of radiographic studies, pulse oximetry and re-evaluation of patient's condition. Medications Ordered in ED Medications  furosemide (LASIX) injection 60 mg (0 mg Intravenous Hold 07/21/19 1014)  aspirin chewable tablet 324 mg (has no administration in time range)  nitroGLYCERIN (NITROGLYN) 2 % ointment 1 inch (has no administration in time  range)  hydrALAZINE (APRESOLINE) injection 20 mg (has no administration in time range)  albuterol (VENTOLIN HFA) 108 (90 Base) MCG/ACT inhaler 8 puff (8 puffs Inhalation Given 07/21/19 0851)     Initial Impression / Assessment and Plan / ED Course  I have reviewed the triage vital signs and the nursing notes.  Pertinent labs & imaging results that were available during my care of the patient were reviewed by me and considered in my medical decision making (see chart for details).  Clinical Course as of Jul 20 1108  Sun Jul 21, 2019  0954 Patient appears clinically significantly improved.  He is reporting he feels much better.  Blood pressure reading at this time is 152 over 90s.  This is before nitroglycerin drip has been initiated.  Patient is not diaphoretic.  Will hold off on BiPAP at this time.  Will reassess several blood pressures and determine if nitroglycerin drip is still needed.   [MP]  1107 Consult: Have reviewed with Dr. Caryl Comes of cardiology.  At this time, EKG does not show definitive STEMI pattern.  Patient is no longer having chest pain.  Plan to start heparin, continue troponin cycling to determine if  elevation is more consistent with demand ischemia and renal insufficiency or NSTEMI.  Cardiology will consult.   [MP]    Clinical Course User Index [MP] Charlesetta Shanks, MD      On arrival, patient was mildly anxious with crackles and tachypnea.  Clinical exam and history were very suggestive of flash pulmonary edema.  Patient does have diffuse infiltrate consistent with edema on chest x-ray.  On arrival patient was severely hypertensive with systolic greater than 518 and diastolic greater than 335.  Respiratory therapist had immediately administered 8 inhalations of albuterol.  My plan was to place the patient on BiPAP and initiate nitroglycerin drip and Lasix.  Patient was transitioned to negative pressure room and by the time of transferring the patient, he is showing clinical improvement.  Blood pressures are now running in the 150s over 80s without the initiation of nitroglycerin drip.  He reports significant subjective improvement.  We will proceed with Lasix for diuresis.  Patient has acute on chronic renal insufficiency.  Findings consistent with flash pulmonary edema and hypertensive emergency/urgency.  Patient has elevated troponin.  Will have consultation with cardiology for rule out MI and suspected demand ischemia.  Final Clinical Impressions(s) / ED Diagnoses   Final diagnoses:  Hypertensive emergency  Acute congestive heart failure, unspecified heart failure type ([place])  Severe comorbid illness  Cocaine abuse [facility])    ED Discharge Orders    None       Charlesetta Shanks, MD 07/21/19 1058    Charlesetta Shanks, MD 07/21/19 1648

## 2019-07-21 NOTE — Consult Note (Signed)
Cardiology Consultation:   [name]; [id number]; 1951/04/20   Admit date: 07/21/2019 Date of Consult: 07/21/2019  Primary Care Provider: [name], NP Primary Cardiologist: No primary care provider on file. Primary Electrophysiologist:  None  Chief Complaint: chest pain, dyspnea  Patient Profile:   Jacob [name] is a 68 y.o. male with a hx of CAD s/p CABG 2005, cardiomyopathy LV EF 30% in 2015 now 40-45% (SPECT 2016 w/scar and no ischemia), CKD with Cr 5-6, HIV, HBV, GI bleed in 01/2019 requiring transfusion, cocaine use, HTN, HLD who presents with shortness of breath and chest tightness.  History of Present Illness:   Patient had worsening shortness of breath Friday and Saturday.  Also had some chest tightness with this, worse then he has had in the past.  Saturday called EMS, sent them away and then had to call them again.  States the dyspnea has been building up for a few days.  Also used cocaine on Friday and reportedly ate some salty foods.  In the ED was hypoxic to the 80s and required 6 L oxygen.  Chest x-ray with pulmonary edema.  Received 80 IV Lasix in the ED.  Labs notable for troponin 284-> 585.  ECG with some mild ST depression and TWI.  Patient started on heparin IV and cardiology was consulted for further evaluation.  Currently chest tightness has improved.  Lying comfortably in bed.  Myoview in February patient noted to have large inferior infarct with no ischemia.  Dr. Alfonse Spruce recommended cardiac cath once patient started on hemodialysis.  Had a GI bleed in April but has not had bleeding since then per patient.  Echo 02/07/19 EF 40-45% Severe LAE no significant valve disease   Myovue 12/13/18 abnormal EF 26% large inferior wall infarct.  Past Medical History:  Diagnosis Date  . Acute respiratory failure ([place]) 03/01/2018  . Arthritis    "all over; mostly knees and back" (02/28/2018)  . CHF (congestive heart failure) (H[facility])    not on any meds   . Chronic lower back pain    stenosis  . CKD (chronic kidney disease), stage V ([place])    [name] 02/28/2018  . Community acquired pneumonia 09/06/2013  . Coronary atherosclerosis of native coronary artery 2005   s/p surgery  . Drug abuse ([place])    hx; tested for cocaine as recently as 2/08. says he is not using drugs now - avoided defib. for this reason   . GERD (gastroesophageal reflux disease)    takes OTC meds as needed  . GI bleeding 02/06/2019  . Glaucoma    uses eye drops daily  . Hepatitis B 1968   "tx'd w/isolation; caught it from toilet stools in gym"  . History of blood transfusion 03/01/2019  . History of colon polyps    benign  . History of gout    takes Allopurinol daily as well as Colchicine-if needed (02/28/2018)  . History of kidney stones   . HTN (hypertension)    takes Coreg,Imdur.and Apresoline daily  . Human immunodeficiency virus (HIV) disease ([place]) dx'd 1995   takes Genvoya daily  . Hyperlipidemia    takes Atorvastatin daily  . Ischemic cardiomyopathy   . Muscle spasm    takes Zanaflex as needed  . Myocardial infarction ([place]) ~ 2004/2005  . Nocturia   . Peripheral neuropathy    takes gabapentin daily  . Pneumonia    "at least twice" (02/28/2018)  . Shortness of breath dyspnea  rarely but if notices it then with exertion  . Syphilis, unspecified   . Type II diabetes mellitus ([place]) 2004   Lantus daily.Average fasting blood sugar 125-199  . Wears glasses   . Wears partial dentures     Past Surgical History:  Procedure Laterality Date  . AV FISTULA PLACEMENT Left 08/02/2018   Procedure: ARTERIOVENOUS (AV) FISTULA CREATION  left arm radiocephlic;  Surgeon: Marty Heck, MD;  Location: [facility];  Service: Vascular;  Laterality: Left;  . BACK SURGERY    . CARDIAC CATHETERIZATION  10/2002; 12/19/2004   [name] 03/08/2011  . COLONOSCOPY  2013   Dr.John Henrene Pastor   . CORONARY ARTERY BYPASS GRAFT  02/24/2003   CABG X2/notes 03/08/2011  .  ESOPHAGOGASTRODUODENOSCOPY (EGD) WITH PROPOFOL N/A 02/08/2019   Procedure: ESOPHAGOGASTRODUODENOSCOPY (EGD) WITH PROPOFOL;  Surgeon: Milus Banister, MD;  Location: [facility];  Service: Gastroenterology;  Laterality: N/A;  . FRACTURE SURGERY    . HOT HEMOSTASIS N/A 02/08/2019   Procedure: HOT HEMOSTASIS (ARGON PLASMA COAGULATION/BICAP);  Surgeon: Milus Banister, MD;  Location[facility] ENDOSCOPY;  Service: Gastroenterology;  Laterality: N/A;  . INTERTROCHANTERIC HIP FRACTURE SURGERY Left 11/2006   [name] 03/08/2011  . LAPAROSCOPIC CHOLECYSTECTOMY  05/2006  . LIGATION OF COMPETING BRANCHES OF ARTERIOVENOUS FISTULA Left 11/05/2018   Procedure: LIGATION OF COMPETING BRANCHES OF ARTERIOVENOUS FISTULA  LEFT  ARM;  Surgeon: Marty Heck, MD;  Location: [facility];  Service: Vascular;  Laterality: Left;  . LUMBAR LAMINECTOMY/DECOMPRESSION MICRODISCECTOMY N/A 02/29/2016   Procedure: Left L4-5 Lateral Recess Decompression, Removal Extradural Intraspinal Facet Cyst;  Surgeon: Marybelle Killings, MD;  Location: [place] [facility];  Service: Orthopedics;  Laterality: N/A;  . MULTIPLE TOOTH EXTRACTIONS    . ORIF MANDIBULAR FRACTURE Left 08/13/2004   ORIF of left body fracture mandible with KLS Martin 2.3-mm six hole/notes 03/08/2011     Inpatient Medications: Scheduled Meds: . [START ON 07/22/2019] allopurinol  100 mg Oral Daily  . [START ON 07/22/2019] aspirin EC  81 mg Oral Daily  . atorvastatin  80 mg Oral Daily  . [START ON 07/22/2019] bictegravir-emtricitabine-tenofovir AF  1 tablet Oral Daily  . gabapentin  300 mg Oral TID  . heparin  2,000 Units Intravenous Once  . hydrALAZINE  25 mg Oral TID  . insulin aspart  0-9 Units Subcutaneous TID WC  . [START ON 07/22/2019] isosorbide mononitrate  30 mg Oral Daily  . [START ON 07/22/2019] umeclidinium-vilanterol  1 puff Inhalation Daily   Continuous Infusions: . sodium chloride 1,000 mL (07/21/19 1614)  . heparin 1,250 Units/hr (07/21/19 1305)   PRN Meds: sodium chloride,  acetaminophen **OR** acetaminophen  Home Meds: Prior to Admission medications   Medication Sig Start Date End Date Taking? Authorizing Provider  acetaminophen (TYLENOL 8 HOUR) 650 MG CR tablet Take 650 mg by mouth every 8 (eight) hours as needed for pain.   Yes [provider]  allopurinol (ZYLOPRIM) 100 MG tablet TAKE 1 TABLET(100 MG) BY MOUTH DAILY Patient taking differently: Take 100 mg by mouth daily.  07/15/19  Yes [name], NP  atorvastatin (LIPITOR) 80 MG tablet Take 1 tablet (80 mg total) by mouth daily. 12/05/18  Yes [name], NP  BIKTARVY 50-200-25 MG TABS tablet TAKE 1 TABLET BY MOUTH DAILY 07/12/19  Yes [name], MD  calcitRIOL (ROCALTROL) 0.25 MCG capsule Take 1 capsule by mouth daily. 06/07/19  Yes [provider]  carvedilol (COREG) 25 MG tablet Take 1 tablet (25 mg total) by mouth 2 (  two) times daily with a meal. 04/16/19  Yes [name], MD  Cholecalciferol (VITAMIN D3) 50 MCG (2000 UT) TABS Take 2,000 Units by mouth daily.   Yes [provider]  Choline Fenofibrate (FENOFIBRIC ACID) 135 MG CPDR TAKE 1 CAPSULE BY MOUTH DAILY Patient taking differently: Take 135 mg by mouth daily.  01/21/19  Yes [name], NP  colchicine 0.6 MG tablet Take one tablet by mouth as needed for gout Patient taking differently: Take 0.6 mg by mouth as needed (gout flare up).  04/30/19  Yes [name], NP  diclofenac sodium (VOLTAREN) 1 % GEL Apply 1 application topically 4 (four) times daily as needed for pain. 07/19/19  Yes [provider]  furosemide (LASIX) 80 MG tablet Take 160 mg by mouth 3 (three) times daily.    Yes [provider]  gabapentin (NEURONTIN) 300 MG capsule TAKE 1 CAPSULE BY MOUTH THREE TIMES DAILY Patient taking differently: Take 300 mg by mouth 3 (three) times daily.  07/15/19  Yes [name], NP  Glucosamine-Chondroit-Vit C-Mn (GLUCOSAMINE CHONDR 1500 COMPLX PO) Take 2 tablets by mouth  daily.    Yes [provider]  hydrALAZINE (APRESOLINE) 25 MG tablet TAKE 1 TABLET(25 MG) BY MOUTH THREE TIMES DAILY Patient taking differently: Take 25 mg by mouth 3 (three) times daily.  04/10/19  Yes [name], NP  Insulin Glargine, 2 Unit Dial, (TOUJEO MAX SOLOSTAR) 300 UNIT/ML SOPN Inject 88 Units into the skin daily. 06/28/19  Yes [name], NP  insulin lispro (HUMALOG) 100 UNIT/ML KwikPen Inject 0.2 mLs (20 Units total) into the skin 3 (three) times daily. 06/04/19  Yes [name], NP  isosorbide mononitrate (IMDUR) 30 MG 24 hr tablet TAKE 1 TABLET(30 MG) BY MOUTH DAILY Patient taking differently: Take 30 mg by mouth daily.  04/15/19  Yes [name], NP  latanoprost (XALATAN) 0.005 % ophthalmic solution Place 1 drop into both eyes at bedtime. Reported on 04/04/2016 04/23/12  Yes [provider]  nitroGLYCERIN (NITROSTAT) 0.3 MG SL tablet ONE TABLET UNDER TONGUE AS NEEDED FOR CHEST PAIN Patient taking differently: Place 0.3 mg under the tongue every 5 (five) minutes as needed for chest pain.  05/01/19  Yes [name], MD  sodium bicarbonate 650 MG tablet Take 650 mg by mouth 2 (two) times daily.  05/21/18  Yes [provider]  timolol (TIMOPTIC) 0.5 % ophthalmic solution Place 1 drop into both eyes every morning. 04/04/19  Yes [provider]  umeclidinium-vilanterol (ANORO ELLIPTA) 62.5-25 MCG/INH AEPB INHALE 1 PUFF BY MOUTH EVERY DAY Patient taking differently: Inhale 1 puff into the lungs daily.  01/15/19  Yes [name], NP  B-D UF III MINI PEN NEEDLES 31G X 5 MM MISC USE FOUR TIMES DAILY 02/25/19   [name], NP  Blood Glucose Monitoring Suppl (ONE TOUCH ULTRA MINI) w/Device KIT 1 each by Does not apply route 2 (two) times daily. Dx: E11.40 08/15/17   Gildardo Cranker, DO  glucose blood test strip Use to test blood sugar three times daily. E11.40 04/15/19   [name], NP  Lancets [facility] DELICA PLUS  OVFIEP32R) MISC 1 each by Does not apply route 3 (three) times daily. Use to test blood sugar three times daily. Dx: E11.40 12/05/18   [name], NP  pantoprazole (PROTONIX) 40 MG tablet Take 1 tablet (40 mg total) by mouth daily. Patient not taking: Reported on 07/21/2019 05/16/19   Dewaine Oats,  [name], NP    Allergies:    Allergies  Allergen Reactions  . Augmentin [Amoxicillin-Pot Clavulanate] Diarrhea    Severe diarrhea   . Amphetamines Other (See Comments)    Unknown reaction type    Social History:   Social History   Socioeconomic History  . Marital status: Single    Spouse name: Not on file  . Number of children: 1  . Years of education: Not on file  . Highest education level: Not on file  Occupational History  . Occupation: retired  . Occupation: part time- [employer]  . Financial resource strain: Not hard at all  . Food insecurity    Worry: Never true    Inability: Never true  . Transportation needs    Medical: No    Non-medical: No  Tobacco Use  . Smoking status: Current Every Day Smoker    Packs/day: 1.00    Years: 43.00    Pack years: 43.00    Types: Cigarettes  . Smokeless tobacco: Never Used  Substance and Sexual Activity  . Alcohol use: Yes    Alcohol/week: 12.0 standard drinks    Types: 12 Standard drinks or equivalent per week    Comment: 6x 24oz 8% ABV beers a week  . Drug use: No    Types: Cocaine    Comment: hx of crack/cocaine 61yr ago  . Sexual activity: Not Currently  Lifestyle  . Physical activity    Days per week: 7 days    Minutes per session: 20 min  . Stress: Only a little  Relationships  . Social [place] phone: Three times a week    Gets together: Three times a week    Attends religious service: More than 4 times per year    Active member of club or organization: No    Attends meetings of clubs or organizations: Never    Relationship status: Never married  . Intimate partner violence    Fear of  current or ex partner: No    Emotionally abused: No    Physically abused: No    Forced sexual activity: No  Other Topics Concern  . Not on file  Social History Narrative   Retired.       As of 05/2015:   Diet: No salt    Caffeine   Married: Single    House: Condo, 2 stories, 1 person (self)    Pets: No    Current/Past profession: N/A   Exercise: walks daily    Living Will: Yes    DNR   POA/HPOA: No         Family History:    Family History  Problem Relation Age of Onset  . Heart failure Father   . Hypertension Father   . Diabetes Brother   . Heart attack Brother   . Alzheimer's disease Mother   . Stroke Sister   . Diabetes Sister   . Alzheimer's disease Sister   . Hypertension Brother   . Diabetes Brother   . Drug abuse Brother   . Colon cancer Neg Hx       ROS:  Please see the history of present illness.  All other ROS reviewed and negative.     Physical Exam/Data:   Vitals:   07/21/19 1400 07/21/19 1415 07/21/19 1501 07/21/19 1517  BP: (!) 151/73 (!) 151/79 (!) 156/83   Pulse: 93 94 88   Resp: _0 Temp:  98 F (36.7 C)   TempSrc:   Oral   SpO2: 99% 97% 100%   Weight:    91.4 kg  Height:    6' 2" (1.88 m)    Intake/Output Summary (Last 24 hours) at 07/21/2019 1920 Last data filed at 07/21/2019 1915 Gross per 24 hour  Intake 527 ml  Output 425 ml  Net 102 ml   Last 3 Weights 07/21/2019 07/21/2019 06/28/2019  Weight (lbs) 201 lb 9.6 oz 207 lb 207 lb 12.8 oz  Weight (kg) 91.445 kg 93.895 kg 94.257 kg     Body mass index is 25.88 kg/m.  General: Well developed, well nourished, in no acute distress. Head: Normocephalic, atraumatic, sclera non-icteric, no xanthomas, nares are without discharge.  Neck: Negative for carotid bruits. JVD not elevated. Lungs: Clear bilaterally to auscultation without wheezes, rales, or rhonchi. Breathing is unlabored. Heart: RRR with S1 S2. No murmurs, rubs, or gallops appreciated. Abdomen: Soft, non-tender,  non-distended with normoactive bowel sounds. No hepatomegaly. No rebound/guarding. No obvious abdominal masses. Msk:  Strength and tone appear normal for age. Extremities: No clubbing or cyanosis. No edema.  Distal pedal pulses are 2+ and equal bilaterally. Neuro: Alert and oriented X 3. No facial asymmetry. No focal deficit. Moves all extremities spontaneously. Psych:  Responds to questions appropriately with a normal affect.   EKG:  The EKG was personally reviewed and demonstrates:  Sinus rhythm RBBB, some ST depression V4-V5 Telemetry:  Telemetry was personally reviewed and demonstrates:  sinus  Relevant CV Studies: 11/2018 Myovue  Nuclear stress EF: 26%.  Blood pressure demonstrated a normal response to exercise.  There was no ST segment deviation noted during stress.  Findings consistent with prior myocardial infarction.  This is a high risk study.  The left ventricular ejection fraction is severely decreased (<30%).   01/2019 Echo  1. The left ventricle has mild-moderately reduced systolic function, with an ejection fraction of 40-45%. The cavity size was normal. Mild basal septal hypertrophy. Left ventricular diastolic Doppler parameters are consistent with pseudonormalization.  Elevated left ventricular end-diastolic pressure Left ventricular diffuse hypokinesis.  2. The right ventricle has normal systolic function. The cavity was normal. There is no increase in right ventricular wall thickness.  3. Left atrial size was severely dilated.  4. There was trivial MR, TR and PR.   Laboratory Data:  High Sensitivity Troponin:   Recent Labs  Lab 07/21/19 0928 07/21/19 1307  TROPONINIHS 284* 585*     Cardiac EnzymesNo results for input(s): TROPONINI in the last 168 hours. No results for input(s): TROPIPOC in the last 168 hours.  Chemistry Recent Labs  Lab 07/21/19 0928 07/21/19 0943  NA 140 141  K 4.5 4.6  CL 108 109  CO2 23  --   GLUCOSE 110* 100*  BUN 72* 68*   CREATININE 5.55* 5.60*  CALCIUM 9.0  --   GFRNONAA 10*  --   GFRAA 11*  --   ANIONGAP 9  --     Recent Labs  Lab 07/21/19 0928  PROT 7.7  ALBUMIN 3.2*  AST 95*  ALT 51*  ALKPHOS 42  BILITOT 0.7   Hematology Recent Labs  Lab 07/21/19 0928 07/21/19 0943  WBC 7.2  --   RBC 2.88*  --   HGB 9.7* 10.5*  HCT 28.9* 31.0*  MCV 100.3*  --   MCH 33.7  --   MCHC 33.6  --   RDW 15.0  --   PLT 200  --  BNP Recent Labs  Lab 07/21/19 0928  BNP 1,535.2*    DDimer No results for input(s): DDIMER in the last 168 hours.   Radiology/Studies:  Dg Chest Port 1 View  Result Date: 07/21/2019 CLINICAL DATA:  Pt arrives via [facility] from home with c/o sob that began a few hours pta, pt also reports new onset of cough this am after he smoked. Ems states O2 sats were in the low 80s upon their arrival, O2 sats improved to 99% on NRB, lung s.*comment was truncated*sob EXAM: PORTABLE CHEST 1 VIEW COMPARISON:  Radiograph 02/06/2019 FINDINGS: Sternotomy wires overlie normal cardiac silhouette. Fine interstitial pattern throughout the lungs suggests interstitial edema. No clear pleural fluid. No focal consolidation. Remote RIGHT rib fractures. IMPRESSION: Interstitial edema pattern. Electronically Signed   By: Suzy Bouchard M.D.   On: 07/21/2019 09:29    Assessment and Plan:   1. NSTEMI Significantly elevated troponin with chest pressure and dyspnea after recent cocaine use and volume overload.  Certainly coronary angiogram would be reasonable, but we are limited by his CKD and creatinine of 5.5.  Will need to discuss more with the patient and nephrology the timing of initiation of dialysis.  If he is to start on dialysis this admission, we can consider catheterization to define his coronary anatomy.  If not, will likely manage an STEMI with anticoagulation alone.  For now agree with treatment with IV heparin, with close monitoring for GI bleeding given admission in April.      For questions or  updates, please contact Fernando Salinas Please consult [email or web address] for contact info under    Signed, Chriss Czar, MD  07/21/2019 7:20 PM

## 2019-07-21 NOTE — ED Triage Notes (Signed)
Pt arrives via [facility] from home with c/o sob that began a few hours pta, pt also reports new onset of cough this am after he smoked. Ems states O2 sats were in the low 80s upon their arrival, O2 sats improved to 99% on NRB, lung sound diminished in L greater than R. BP 204/120. Hx chf, HTN, HIV.pt a/ox4.

## 2019-07-21 NOTE — Progress Notes (Signed)
Troponin 899 was 585, no s/s, MD notified, will continue to monitor, Thanks Arvella Nigh RN.

## 2019-07-21 NOTE — H&P (Addendum)
[facility] Admission History and Physical Service Pager: [phone]  Patient name: Jacob [name] Medical record number: [id number] Date of birth: May 11, 1951 Age: 68 y.o. Gender: male  Primary Care Provider: [name], NP Consultants: Cardiology Code Status: Full Preferred Emergency Contact: obtain in AM  Chief Complaint: Worsening SOB, left sided chest pain  Assessment and Plan: Jacob [name] is a 68 y.o. male presenting with dyspnea, chest pain. PMH is significant for CHF, CAD, cardiomyopathy, HIV, DMT2, substance use disorder.  Acute Hypoxic Respiratory Failure likely 2/2 CHF exacerbation  HFrEF (EF 40-45%): Patient presenting with worsening shortness of breath and new onset atypical chest pain in the setting of known HFrEF (EF 40-45%) after cocaine use on Friday night and high salt consumption. His chest pain improved in the ED status post O2 supplementation. Patient found to be hypoxic to 80's in ED, improved with 6L [place] and severely hypertensive with BP's in 200's/120's. BNP elevated to 1,535, last BNP 852 in April.  Chest X-ray reveals interstitial edema, supporting CHF exacerbation.. He had minimal lung sounds in lower lung fields with crackles. He received 66m IV lasix in the ED with only 2543mUOP. However he did have improvement in his dyspnea and vitals after initial diuresis. No wheezing or cough to indicate COPD exacerbation. EKG today reveals new, diffuse ST depressions, sinus rhythm with occasional PVCs, and RBBB stable from previous ekg. High-sensitvity troponin uptrending from 284 to 585. Some concern for ACS which may be contributing to symptoms. Patient does have a hemoglobin of 9.7>10.5 on admission (baseline appears to be 13). This could be secondary to hemodilution, however anemia of chronic disease vs recurrent GI bleed (hospitalized for acute GI bleed in April 2020) are possible and may be contributing to symptoms as well. Differential  is CHF exacerbation secondary to cocaine use versus increased sodium intake versus ACS vs Anemia.  Patient does have an long history of CAD s/p CABG in 2004.    Home meds are Coreg 25 mg BID, Lasix 160 mg TID, Imdur 30 mg, nitroglycerin. - Admit to FPTS, cardiac telemetry, attending Dr. McArdelia Mems s/p 6059mV lasix - will give 120m100m lasix - strict I/O's, daily weights - Start heparin gtt - ACS work up as below  - Continuous pulse ox - O2 supplementation, maintain oxygenation >88% via  - AM ekg - AM CBC, CMP  - hold home meds  Elevated Troponin  Atypical Chest pain, resolved  H/o CAD s/p CABG 2004 Patient endorsed nonreproducable left sided chest pain upon presentation to ED that had resolved by time of admission. Trop uptrending from 285>585. EKG with signs of ST depressions. Differential for elevated trops include cocaine use and increased demand 2/2 to CHF exacerbation with poor excretion from ESRD  Vs ACS. Per chart review, patient was supposed to have a heart cath in March of this year, but could not due to worsening kidney status. Given CAD history and elevation of trops, will continue to trend trops and consult cards. Patient started on heparin drip. Home meds include high intensity statin. - continue home meds - trend trops - cards consulted, will follow up recs - continue heparin drip  Anemia of Chronic Disease  H/O GI Bleed (01/2019) Patient hospitalized for acute GI bleed in April 2020. GI consulted at that time and EGD notable for few small AVMs in the stomach and duodenal bulb that were all destroyed successfully. Recommended colonoscopy at that time however patient refused. Patient follows with  Dr. [name] for IV Feraheme q6 weeks.  Hemoglobin 9.7 on admission, repeat stable at 10.5. MCV elevated to 100.3. Last iron panel July 2020: Iron, TIBC, Ferritin WNL. Folate WNL in April 2020. Differential includes anemia of chronic kidney disease vs chronic alcohol use vs GI bleed.  PT/INR 13.8, 1.1. No signs of bleeding on exam. - follow with daily CBC - FOBT ordered - consider repeat irons studies  - consider GI consult  - pending FOBT, will need to discuss Heparin dtt and ASA  Polysubstance Use Disorder Last reported intermittent cocaine use, last used 2 days ago. Smokes 1PPD x >45 years. Drinks ~6-32oz beers on weekends. Last drink 9/26. - CIWAs without Ativan - Nicotine patch PRN - AM EKG - avoid BB given recent cocaine use - follow up UDS - encourage cessation of drug use  ESRD not on HD CKD Stage V since 02/2018.  AV fistula placed in October 2019 by Dr. Monica Martinez, [facility]. Follows with Dr. [name], Nephrology, for anemia of chronic disease. Phosphorus elevated to 5.9, magnesium 1.9.  Creatinine appears to be around baseline at 5.55 (4.94 on 06/24/19). - monitor with daily BMP - consider phosphate binder vs nephology consult  Elevated LFTs AST, ALT mildly elevated to 95, 51 respectively.  Recently AST/ALT were 30, 25 respectively on 06/24/2019.  Patient reported that he drank six 32 ounce beers over the weekend, clear due to alcohol intake. - Monitor with CMP  T2DM: Home meds include: Toujeo 88U QD and Humalog 20U TID. On 9/4, his BG readings range from 130s to 220. -Start SSI for now and monitor over the next 24 hours to establish appropriate inpatient insulin regimen - BG qAC, qHS, qAM fasting  COPD Has 45-pack-year smoking history.  He smokes a pack per day.  Home medication is Incruse Ellipta. -Continue home medications  HTN Blood pressures elevated on admission: 170-190's/90's. Likely secondary to hypervolemia. Home medication includes hydralazine 25 mg TID.  Received hydralazine 20 mg IV x1 in the ED. -Continue home meds  HIV Home med is Biktarvy. HIV-1 RNA quantification was not detectable on 06/24/2019. He follows with Dr. [name] at [facility]. Last appt 07/08/2019. Good control. -Continue home meds  Gout Home meds include colchicine and  allopurinol. - hold colchicine - continue allopurinol  HLD Home meds are high intensity statin. -Continue atorvastatin 80 mg  FEN/GI: Heart heathy/Carb Modified/Renal diet Prophylaxis: heparin gtt  Disposition: to cardiac telemetry pending further work up and stabilization  History of Present Illness:  Jacob [name] is a 68 y.o. male presenting with worsening shortness of breath starting on Friday. He notes he walked from his house to his mailbox and noted that he was getting more short of breath. He notes that the remainder of the day he drank beer and hung out with friends. On Saturday, he felt SOB but was doing well so just shook it off. He ordered some pizza that was really salty. He notes worsening SOB this morning thus presented to the ED. He notes he took 153m Lasix and Coreg this AM. He was noted to be hypoxic to 80's in ED thus was plced on 6L O2 with improvement in his symptoms. He received 832mIV lasix in the ED. He has had 25029mOP thus far. Patient sleeps on 1 pillow and has not had to sleep on more pillows. Notes he has been urinating normally.  He also endorsed some constant burning left sided chest pain that started early this morning when he was sitting  at the side of his bed. This continued a little while once he got to the ED but it has now resolved. He notes it was constant at that time. He has a history of acid reflux but isn't sure if this feels similar as he never really had symptoms with that. He denies any numbness/tingling in his arms or jaw pain. He notes it feels like he is struggling to get air. He also endorses lightheadedness when he stood up. Denies any swelling in his legs. He notes he was at his kidney doctor for IV ferriheme. Patient notes compliance to all of his medicines.  He does endorsed vomiting this morning that was yellow in color. "Looked like beer". He was able to take his medicines after without any trouble.  Of note, patient occasionally uses  cocaine. Last used Friday night. He notes this was the first time he used this year. Patient endorses drinking 6-32oz beers from Friday to Saturday. He notes he drinks this much primarily on weekends. He smokes 1 PPD x >45 years.  Patient notes he is not on oxygen at home. He is independent and able to complete ADLs.   Review Of Systems: Per HPI with the following additions:  Review of Systems  Constitutional: Positive for diaphoresis. Negative for chills and fever.  HENT: Negative for congestion and sore throat.   Respiratory: Positive for shortness of breath. Negative for cough and sputum production.   Cardiovascular: Positive for chest pain.  Gastrointestinal: Positive for vomiting. Negative for abdominal pain, constipation, diarrhea and nausea.  Genitourinary: Negative for dysuria.  Neurological: Positive for dizziness. Negative for loss of consciousness.  Psychiatric/Behavioral: Positive for substance abuse.    Patient Active Problem List   Diagnosis Date Noted  . Diabetic foot ulcer ([place]) 02/11/2019  . Gastric AVM   . CKD stage 4 due to type 2 diabetes mellitus ([place]) 05/30/2018  . Hyperlipidemia associated with type 2 diabetes mellitus ([place]) 05/30/2018  . Right foot ulcer ([place]) 02/28/2018  . Chronic obstructive pulmonary disease ([place]) 02/28/2018  . Anemia of chronic disease 11/20/2016  . CKD (chronic kidney disease) stage 3, GFR 30-59 ml/min (HCC) 11/20/2016  . CHF (congestive heart failure) ([place]) 11/10/2016  . Pneumonia 08/02/2016  . History of lumbar laminectomy for spinal cord decompression 02/29/2016  . Type 2 diabetes mellitus with vascular disease ([place]) 06/17/2015  . HTN (hypertension) 04/26/2015  . DM (diabetes mellitus) ([place]) 04/26/2015  . Ischemic cardiomyopathy 05/12/2014  . Ulcer of lower extremity ([place]) 09/06/2013  . Chest pain 09/06/2013  . Type II or unspecified type diabetes mellitus with unspecified complication, not stated as uncontrolled 10/22/2012  .  Bunion of left foot 11/25/2011  . Bunion, right foot 11/25/2011  . Polysubstance abuse ([place]) 07/28/2011  . Hip fracture, left ([place]) 04/04/2011  . Closed fracture of neck of femur ([place]) 04/04/2011  . Insomnia 02/18/2011  . CAD (coronary artery disease) 04/20/2009  . Acute on chronic combined systolic (congestive) and diastolic (congestive) heart failure ([place]) 04/20/2009  . HLD (hyperlipidemia) 11/20/2006  . Gout 11/20/2006  . TOBACCO ABUSE 11/20/2006  . Essential hypertension 11/20/2006  . Human immunodeficiency virus (HIV) disease ([place]) 09/02/2006    Past Medical History: Past Medical History:  Diagnosis Date  . Acute respiratory failure ([place]) 03/01/2018  . Arthritis    "all over; mostly knees and back" (02/28/2018)  . CHF (congestive heart failure) (HCC)    not on any meds  . Chronic lower back pain    stenosis  . CKD (  chronic kidney disease), stage V ([place])    [name] 02/28/2018  . Community acquired pneumonia 09/06/2013  . Coronary atherosclerosis of native coronary artery 2005   s/p surgery  . Drug abuse ([place])    hx; tested for cocaine as recently as 2/08. says he is not using drugs now - avoided defib. for this reason   . GERD (gastroesophageal reflux disease)    takes OTC meds as needed  . GI bleeding 02/06/2019  . Glaucoma    uses eye drops daily  . Hepatitis B 1968   "tx'd w/isolation; caught it from toilet stools in gym"  . History of blood transfusion 03/01/2019  . History of colon polyps    benign  . History of gout    takes Allopurinol daily as well as Colchicine-if needed (02/28/2018)  . History of kidney stones   . HTN (hypertension)    takes Coreg,Imdur.and Apresoline daily  . Human immunodeficiency virus (HIV) disease ([place]) dx'd 1995   takes Genvoya daily  . Hyperlipidemia    takes Atorvastatin daily  . Ischemic cardiomyopathy   . Muscle spasm    takes Zanaflex as needed  . Myocardial infarction ([place]) ~ 2004/2005  . Nocturia   . Peripheral neuropathy     takes gabapentin daily  . Pneumonia    "at least twice" (02/28/2018)  . Shortness of breath dyspnea    rarely but if notices it then with exertion  . Syphilis, unspecified   . Type II diabetes mellitus ([place]) 2004   Lantus daily.Average fasting blood sugar 125-199  . Wears glasses   . Wears partial dentures     Past Surgical History: Past Surgical History:  Procedure Laterality Date  . AV FISTULA PLACEMENT Left 08/02/2018   Procedure: ARTERIOVENOUS (AV) FISTULA CREATION  left arm radiocephlic;  Surgeon: Marty Heck, MD;  Location: [facility];  Service: Vascular;  Laterality: Left;  . BACK SURGERY    . CARDIAC CATHETERIZATION  10/2002; 12/19/2004   [name] 03/08/2011  . COLONOSCOPY  2013   Dr.John Henrene Pastor   . CORONARY ARTERY BYPASS GRAFT  02/24/2003   CABG X2/notes 03/08/2011  . ESOPHAGOGASTRODUODENOSCOPY (EGD) WITH PROPOFOL N/A 02/08/2019   Procedure: ESOPHAGOGASTRODUODENOSCOPY (EGD) WITH PROPOFOL;  Surgeon: Milus Banister, MD;  Location: [facility];  Service: Gastroenterology;  Laterality: N/A;  . FRACTURE SURGERY    . HOT HEMOSTASIS N/A 02/08/2019   Procedure: HOT HEMOSTASIS (ARGON PLASMA COAGULATION/BICAP);  Surgeon: Milus Banister, MD;  Location[facility];  Service: Gastroenterology;  Laterality: N/A;  . INTERTROCHANTERIC HIP FRACTURE SURGERY Left 11/2006   [name] 03/08/2011  . LAPAROSCOPIC CHOLECYSTECTOMY  05/2006  . LIGATION OF COMPETING BRANCHES OF ARTERIOVENOUS FISTULA Left 11/05/2018   Procedure: LIGATION OF COMPETING BRANCHES OF ARTERIOVENOUS FISTULA  LEFT  ARM;  Surgeon: Marty Heck, MD;  Location: [facility];  Service: Vascular;  Laterality: Left;  . LUMBAR LAMINECTOMY/DECOMPRESSION MICRODISCECTOMY N/A 02/29/2016   Procedure: Left L4-5 Lateral Recess Decompression, Removal Extradural Intraspinal Facet Cyst;  Surgeon: Marybelle Killings, MD;  Location: [facility];  Service: Orthopedics;  Laterality: N/A;  . MULTIPLE TOOTH EXTRACTIONS    . ORIF MANDIBULAR FRACTURE Left  08/13/2004   ORIF of left body fracture mandible with KLS Martin 2.3-mm six hole/notes 03/08/2011    Social History: Social History   Tobacco Use  . Smoking status: Current Every Day Smoker    Packs/day: 1.00    Years: 43.00    Pack years: 43.00    Types: Cigarettes  . Smokeless  tobacco: Never Used  Substance Use Topics  . Alcohol use: Yes    Alcohol/week: 12.0 standard drinks    Types: 12 Standard drinks or equivalent per week    Comment: 6x 24oz 8% ABV beers a week  . Drug use: No    Types: Cocaine    Comment: hx of crack/cocaine 68yr ago   Additional social history: Patient lives alone.  He had a friend come visit this weekend from out of town, and decided to use cocaine this weekend for the first time on 2020.  Please also refer to relevant sections of EMR.  Family History: Family History  Problem Relation Age of Onset  . Heart failure Father   . Hypertension Father   . Diabetes Brother   . Heart attack Brother   . Alzheimer's disease Mother   . Stroke Sister   . Diabetes Sister   . Alzheimer's disease Sister   . Hypertension Brother   . Diabetes Brother   . Drug abuse Brother   . Colon cancer Neg Hx    Allergies and Medications: Allergies  Allergen Reactions  . Augmentin [Amoxicillin-Pot Clavulanate] Diarrhea    Severe diarrhea   . Amphetamines Other (See Comments)    Unknown reaction type   No current facility-administered medications on file prior to encounter.    Current Outpatient Medications on File Prior to Encounter  Medication Sig Dispense Refill  . acetaminophen (TYLENOL 8 HOUR) 650 MG CR tablet Take 650 mg by mouth every 8 (eight) hours as needed for pain.    .Marland Kitchenallopurinol (ZYLOPRIM) 100 MG tablet TAKE 1 TABLET(100 MG) BY MOUTH DAILY (Patient taking differently: Take 100 mg by mouth daily. ) 90 tablet 1  . atorvastatin (LIPITOR) 80 MG tablet Take 1 tablet (80 mg total) by mouth daily. 90 tablet 3  . BIKTARVY 50-200-25 MG TABS tablet TAKE 1  TABLET BY MOUTH DAILY 30 tablet 5  . calcitRIOL (ROCALTROL) 0.25 MCG capsule Take 1 capsule by mouth daily.    . carvedilol (COREG) 25 MG tablet Take 1 tablet (25 mg total) by mouth 2 (two) times daily with a meal. 180 tablet 3  . Cholecalciferol (VITAMIN D3) 50 MCG (2000 UT) TABS Take 2,000 Units by mouth daily.    . Choline Fenofibrate (FENOFIBRIC ACID) 135 MG CPDR TAKE 1 CAPSULE BY MOUTH DAILY (Patient taking differently: Take 135 mg by mouth daily. ) 90 capsule 1  . colchicine 0.6 MG tablet Take one tablet by mouth as needed for gout (Patient taking differently: Take 0.6 mg by mouth as needed (gout flare up). ) 30 tablet 2  . diclofenac sodium (VOLTAREN) 1 % GEL Apply 1 application topically 4 (four) times daily as needed for pain.    . furosemide (LASIX) 80 MG tablet Take 160 mg by mouth 3 (three) times daily.     .Marland Kitchengabapentin (NEURONTIN) 300 MG capsule TAKE 1 CAPSULE BY MOUTH THREE TIMES DAILY (Patient taking differently: Take 300 mg by mouth 3 (three) times daily. ) 270 capsule 1  . Glucosamine-Chondroit-Vit C-Mn (GLUCOSAMINE CHONDR 1500 COMPLX PO) Take 2 tablets by mouth daily.     . hydrALAZINE (APRESOLINE) 25 MG tablet TAKE 1 TABLET(25 MG) BY MOUTH THREE TIMES DAILY (Patient taking differently: Take 25 mg by mouth 3 (three) times daily. ) 270 tablet 1  . Insulin Glargine, 2 Unit Dial, (TOUJEO MAX SOLOSTAR) 300 UNIT/ML SOPN Inject 88 Units into the skin daily. 12 pen 3  . insulin lispro (HUMALOG) 100 UNIT/ML KwikPen  Inject 0.2 mLs (20 Units total) into the skin 3 (three) times daily. 15 pen 1  . isosorbide mononitrate (IMDUR) 30 MG 24 hr tablet TAKE 1 TABLET(30 MG) BY MOUTH DAILY (Patient taking differently: Take 30 mg by mouth daily. ) 90 tablet 0  . latanoprost (XALATAN) 0.005 % ophthalmic solution Place 1 drop into both eyes at bedtime. Reported on 04/04/2016    . nitroGLYCERIN (NITROSTAT) 0.3 MG SL tablet ONE TABLET UNDER TONGUE AS NEEDED FOR CHEST PAIN (Patient taking differently: Place  0.3 mg under the tongue every 5 (five) minutes as needed for chest pain. ) 25 tablet 4  . sodium bicarbonate 650 MG tablet Take 650 mg by mouth 2 (two) times daily.   3  . timolol (TIMOPTIC) 0.5 % ophthalmic solution Place 1 drop into both eyes every morning.    . umeclidinium-vilanterol (ANORO ELLIPTA) 62.5-25 MCG/INH AEPB INHALE 1 PUFF BY MOUTH EVERY DAY (Patient taking differently: Inhale 1 puff into the lungs daily. ) 60 each 5  . B-D UF III MINI PEN NEEDLES 31G X 5 MM MISC USE FOUR TIMES DAILY 100 each 11  . Blood Glucose Monitoring Suppl (ONE TOUCH ULTRA MINI) w/Device KIT 1 each by Does not apply route 2 (two) times daily. Dx: E11.40 1 each 0  . glucose blood test strip Use to test blood sugar three times daily. E11.40 300 each 3  . Lancets (ONETOUCH DELICA PLUS [id number]) MISC 1 each by Does not apply route 3 (three) times daily. Use to test blood sugar three times daily. Dx: E11.40 300 each 3  . pantoprazole (PROTONIX) 40 MG tablet Take 1 tablet (40 mg total) by mouth daily. (Patient not taking: Reported on 07/21/2019) 90 tablet 1    Objective: BP (!) 157/76   Pulse 88   Temp 98.2 F (36.8 C) (Oral)   Resp 17   SpO2 100%  Exam: General: pleasant, older gentleman in mild respiratory distress, sitting comfortably in bed Eyes: anicteric ENTM: clear oropharynx Neck: supple, no LAD Cardiovascular: tachycardic, regular rhythm, no murmur, rub, or gallop; no LE edema, no JVP Respiratory: crackles at bilateral bases, diminished aeration in bilateral mid lung fields and bases Gastrointestinal: soft, nt, nd, normal bowel sounds MSK: no soreness  Derm: no rashes, ecchymosis Neuro: AOx3, no focal deficits Psych: normal affect, normal mood  Labs and Imaging: CBC BMET  Recent Labs  Lab 07/21/19 0928 07/21/19 0943  WBC 7.2  --   HGB 9.7* 10.5*  HCT 28.9* 31.0*  PLT 200  --    Recent Labs  Lab 07/21/19 0928 07/21/19 0943  NA 140 141  K 4.5 4.6  CL 108 109  CO2 23  --   BUN 72*  68*  CREATININE 5.55* 5.60*  GLUCOSE 110* 100*  CALCIUM 9.0  --      EKG: Sinus tachycardia, diffuse ST depression Right bundle branch block Inferior infarct, acute Baseline wander in lead(s) III aVL aVF V5  Dg Chest Port 1 View  Result Date: 07/21/2019 CLINICAL DATA:  Pt arrives via [facility] from home with c/o sob that began a few hours pta, pt also reports new onset of cough this am after he smoked. Ems states O2 sats were in the low 80s upon their arrival, O2 sats improved to 99% on NRB, lung s.*comment was truncated*sob EXAM: PORTABLE CHEST 1 VIEW COMPARISON:  Radiograph 02/06/2019 FINDINGS: Sternotomy wires overlie normal cardiac silhouette. Fine interstitial pattern throughout the lungs suggests interstitial edema. No clear pleural fluid. No focal  consolidation. Remote RIGHT rib fractures. IMPRESSION: Interstitial edema pattern. Electronically Signed   By: Suzy Bouchard M.D.   On: 07/21/2019 09:29   Gladys Damme, MD 07/21/2019, 11:38 AM PGY-1, [place] Intern pager: [phone], text pages welcome   Upper Level Addendum: I have seen and evaluated this patient along with Dr. Chauncey Reading and reviewed the above note, making necessary revisions in green.   General: pleasant older male, mild respiratory distress with 6L [place] in place, sitting  Comfortably in ED bed CV: regular rate and rhythm without murmurs, rubs, or gallops, no lower extremity edema, 2+ radial and pedal pulses bilaterally Lungs: minimal lung sounds at lung bases with crackles bilaterally, improved lung sounds in middle and upper lung fields bilaterally, breathing comfortably and speaking in full sentences on 6L [place] Abdomen: soft, non-tender, non-distended, normoactive bowel sounds Skin: warm, dry Extremities: warm and well perfused Neuro: Alert and oriented, speech normal  Mina Marble, D.O. 07/21/2019, 10:23 PM PGY-2, [place]

## 2019-07-21 NOTE — ED Notes (Signed)
Attempted to medicate and draw labs. Pt states he needs to have BM first. Bed side commode brought to pt.

## 2019-07-21 NOTE — ED Notes (Signed)
ED Provider at bedside. 

## 2019-07-21 NOTE — Progress Notes (Signed)
[facility] for heparin Indication: chest pain/ACS  Heparin Dosing Weight: 93.9 kg  Labs: Recent Labs    07/21/19 0928 07/21/19 0943  HGB 9.7* 10.5*  HCT 28.9* 31.0*  PLT 200  --   LABPROT 13.8  --   INR 1.1  --   CREATININE 5.55* 5.60*  TROPONINIHS 284*  --     CrCl cannot be calculated (Unknown ideal weight.).  Assessment: 41 yom presenting with SOB, elevated troponin. Pharmacy consulted to dose heparin for ACS. Pt is not on anticoagulation PTA. Hg 10.5, plt wnl. Noted hx of GIB documented in April 2020, but no current active bleed issues documented.  Goal of Therapy:  Heparin level 0.3-0.7 units/ml Monitor platelets by anticoagulation protocol: Yes   Plan:  Heparin 4000 unit bolus Start heparin at 1250 units/h 8h heparin level Daily heparin level/CBC Monitor s/sx bleeding   Jacob [name], PharmD, BCPS Clinical Pharmacist 07/20/2019 10:11 AM

## 2019-07-21 NOTE — ED Notes (Signed)
PT completed BM states he was dizzy. Admitting MDs now at bedside.

## 2019-07-21 NOTE — ED Notes (Signed)
Attempted to pull blood off iv without success, phlebotomy made aware.

## 2019-07-21 NOTE — Progress Notes (Signed)
[facility] for heparin Indication: chest pain/ACS  Heparin Dosing Weight: 91 kg  Labs: Recent Labs    07/21/19 0928 07/21/19 0943 07/21/19 1307 07/21/19 1821  HGB 9.7* 10.5*  --   --   HCT 28.9* 31.0*  --   --   PLT 200  --   --   --   LABPROT 13.8  --   --   --   INR 1.1  --   --   --   HEPARINUNFRC  --   --   --  0.12*  CREATININE 5.55* 5.60*  --   --   TROPONINIHS 284*  --  585*  --     Estimated Creatinine Clearance: 14.3 mL/min (A) (by C-G formula based on SCr of 5.6 mg/dL (H)).  Assessment: 21 yom presenting with SOB, elevated troponin. Pharmacy consulted to dose heparin for ACS. Pt is not on anticoagulation PTA. Hg 10.5, plt wnl. Noted hx of GIB documented in April 2020, but no current active bleed issues documented.  Initial heparin level subtherapeutic at 0.12, troponins rising.  Goal of Therapy:  Heparin level 0.3-0.7 units/ml Monitor platelets by anticoagulation protocol: Yes   Plan:  Heparin 2000 unit bolus Increase heparin to 1450 units/h Recheck heparin level with morning labs   Arrie Senate, PharmD, BCPS Clinical Pharmacist [phone] Please check AMION for all [place] numbers 07/21/2019

## 2019-07-21 NOTE — ED Notes (Signed)
Admitting doctor at bedside 

## 2019-07-22 ENCOUNTER — Inpatient Hospital Stay (HOSPITAL_COMMUNITY): Payer: Medicare Other

## 2019-07-22 DIAGNOSIS — I5043 Acute on chronic combined systolic (congestive) and diastolic (congestive) heart failure: Secondary | ICD-10-CM

## 2019-07-22 LAB — CBC
HCT: 25.3 % — ABNORMAL LOW (ref 39.0–52.0)
HCT: 26.3 % — ABNORMAL LOW (ref 39.0–52.0)
Hemoglobin: 8.5 g/dL — ABNORMAL LOW (ref 13.0–17.0)
Hemoglobin: 8.5 g/dL — ABNORMAL LOW (ref 13.0–17.0)
MCH: 32.6 pg (ref 26.0–34.0)
MCH: 33.3 pg (ref 26.0–34.0)
MCHC: 32.3 g/dL (ref 30.0–36.0)
MCHC: 33.6 g/dL (ref 30.0–36.0)
MCV: 100.8 fL — ABNORMAL HIGH (ref 80.0–100.0)
MCV: 99.2 fL (ref 80.0–100.0)
Platelets: 182 10*3/uL (ref 150–400)
Platelets: 195 10*3/uL (ref 150–400)
RBC: 2.55 MIL/uL — ABNORMAL LOW (ref 4.22–5.81)
RBC: 2.61 MIL/uL — ABNORMAL LOW (ref 4.22–5.81)
RDW: 15.2 % (ref 11.5–15.5)
RDW: 15.2 % (ref 11.5–15.5)
WBC: 6.1 10*3/uL (ref 4.0–10.5)
WBC: 7.9 10*3/uL (ref 4.0–10.5)
nRBC: 0 % (ref 0.0–0.2)
nRBC: 0 % (ref 0.0–0.2)

## 2019-07-22 LAB — RENAL FUNCTION PANEL
Albumin: 2.9 g/dL — ABNORMAL LOW (ref 3.5–5.0)
Anion gap: 11 (ref 5–15)
BUN: 74 mg/dL — ABNORMAL HIGH (ref 8–23)
CO2: 20 mmol/L — ABNORMAL LOW (ref 22–32)
Calcium: 8.6 mg/dL — ABNORMAL LOW (ref 8.9–10.3)
Chloride: 104 mmol/L (ref 98–111)
Creatinine, Ser: 5.79 mg/dL — ABNORMAL HIGH (ref 0.61–1.24)
GFR calc Af Amer: 11 mL/min — ABNORMAL LOW (ref >60)
GFR calc non Af Amer: 9 mL/min — ABNORMAL LOW (ref >60)
Glucose, Bld: 149 mg/dL — ABNORMAL HIGH (ref 70–99)
Phosphorus: 4.5 mg/dL (ref 2.5–4.6)
Potassium: 4.2 mmol/L (ref 3.5–5.1)
Sodium: 135 mmol/L (ref 135–145)

## 2019-07-22 LAB — GLUCOSE, CAPILLARY
Glucose-Capillary: 166 mg/dL — ABNORMAL HIGH (ref 70–99)
Glucose-Capillary: 108 mg/dL — ABNORMAL HIGH (ref 70–99)
Glucose-Capillary: 153 mg/dL — ABNORMAL HIGH (ref 70–99)
Glucose-Capillary: 135 mg/dL — ABNORMAL HIGH (ref 70–99)
Glucose-Capillary: 113 mg/dL — ABNORMAL HIGH (ref 70–99)

## 2019-07-22 LAB — HEPARIN LEVEL (UNFRACTIONATED)
Heparin Unfractionated: 0.14 IU/mL — ABNORMAL LOW (ref 0.30–0.70)
Heparin Unfractionated: <0.1 IU/mL — ABNORMAL LOW (ref 0.30–0.70)

## 2019-07-22 LAB — TROPONIN I (HIGH SENSITIVITY): Troponin I (High Sensitivity): 864 ng/L (ref <18)

## 2019-07-22 MED ORDER — UMECLIDINIUM-VILANTEROL 62.5-25 MCG/INH IN AEPB
1.0000 | INHALATION_SPRAY | Freq: Every day | RESPIRATORY_TRACT | Status: DC
  Administered 2019-07-22 – 2019-08-01 (×8): 1 via RESPIRATORY_TRACT
  Filled 2019-07-22: qty 14

## 2019-07-22 MED ORDER — GUAIFENESIN 100 MG/5ML PO SOLN
5.0000 mL | ORAL | Status: DC | PRN
  Administered 2019-07-22 – 2019-07-27 (×5): 100 mg via ORAL
  Filled 2019-07-22 (×5): qty 5

## 2019-07-22 MED ORDER — FUROSEMIDE 10 MG/ML IJ SOLN
160.0000 mg | Freq: Once | INTRAVENOUS | Status: AC
  Administered 2019-07-22: 21:00:00 160 mg via INTRAVENOUS
  Filled 2019-07-22: qty 10

## 2019-07-22 MED ORDER — HEPARIN BOLUS VIA INFUSION
2000.0000 [IU] | Freq: Once | INTRAVENOUS | Status: AC
  Administered 2019-07-22: 2000 [IU] via INTRAVENOUS
  Filled 2019-07-22: qty 2000

## 2019-07-22 NOTE — Progress Notes (Signed)
Patient with a yellow mews; treatment team notified, patient is stable and asymptomatic at this time, will monitor patient vital signs q2h until green Neta Mends RN 1:13 PM 07-22-2019

## 2019-07-22 NOTE — TOC Initial Note (Signed)
Transition of Care [facility]) - Initial/Assessment Note    Patient Details  Name: Jacob [name] MRN: [id number] Date of Birth: 09/22/1951  Transition of Care [facility]) CM/SW Contact:    Zenon Mayo, RN Phone Number: 07/22/2019, 5:14 PM  Clinical Narrative:                 Patient form home alone, he has no issues with medications cost and he has transportation at dc.  TOC team will follow for TOC needs.  Expected Discharge Plan: Home/Self Care Barriers to Discharge: No Barriers Identified   Patient Goals and CMS Choice Patient states their goals for this hospitalization and ongoing recovery are:: get better, get dialysis   Choice offered to / list presented to : NA  Expected Discharge Plan and Services Expected Discharge Plan: Home/Self Care In-house Referral: NA Discharge Planning Services: CM Consult Post Acute Care Choice: NA Living arrangements for the past 2 months: Apartment                 DME Arranged: (NA)         HH Arranged: NA          Prior Living Arrangements/Services Living arrangements for the past 2 months: Apartment Lives with:: Self Patient language and need for interpreter reviewed:: Yes Do you feel safe going back to the place where you live?: Yes      Need for Family Participation in Patient Care: No (Comment) Care giver support system in place?: No (comment)   Criminal Activity/Legal Involvement Pertinent to Current Situation/Hospitalization: No - Comment as needed  Activities of Daily Living Home Assistive Devices/Equipment: Dentures (specify type), Cane (specify quad or straight) ADL Screening (condition at time of admission) Patient's cognitive ability adequate to safely complete daily activities?: Yes Is the patient deaf or have difficulty hearing?: No Does the patient have difficulty seeing, even when wearing glasses/contacts?: No Does the patient have difficulty concentrating, remembering, or making decisions?: No Patient able  to express need for assistance with ADLs?: Yes Does the patient have difficulty dressing or bathing?: No Independently performs ADLs?: Yes (appropriate for developmental age) Does the patient have difficulty walking or climbing stairs?: No Weakness of Legs: None Weakness of Arms/Hands: None  Permission Sought/Granted                  Emotional Assessment Appearance:: Appears stated age Attitude/Demeanor/Rapport: Engaged Affect (typically observed): Appropriate Orientation: : Oriented to Self, Oriented to Place, Oriented to  Time, Oriented to Situation Alcohol / Substance Use: Alcohol Use, Tobacco Use Psych Involvement: No (comment)  Admission diagnosis:  Cocaine abuse ([place]) [F14.10] Hypertensive emergency [I16.1] Severe comorbid illness [R69] Acute congestive heart failure, unspecified heart failure type ([place]) [I50.9] Patient Active Problem List   Diagnosis Date Noted  . CHF exacerbation ([place]) 07/21/2019  . Diabetic foot ulcer ([place]) 02/11/2019  . Gastric AVM   . CKD stage 4 due to type 2 diabetes mellitus ([place]) 05/30/2018  . Hyperlipidemia associated with type 2 diabetes mellitus ([place]) 05/30/2018  . Right foot ulcer ([place]) 02/28/2018  . Chronic obstructive pulmonary disease ([place]) 02/28/2018  . Anemia of chronic disease 11/20/2016  . CKD (chronic kidney disease) stage 3, GFR 30-59 ml/min (HCC) 11/20/2016  . CHF (congestive heart failure) ([place]) 11/10/2016  . Pneumonia 08/02/2016  . History of lumbar laminectomy for spinal cord decompression 02/29/2016  . Type 2 diabetes mellitus with vascular disease ([place]) 06/17/2015  . HTN (hypertension) 04/26/2015  . DM (diabetes mellitus) ([place]) 04/26/2015  .  Ischemic cardiomyopathy 05/12/2014  . Ulcer of lower extremity ([place]) 09/06/2013  . Chest pain 09/06/2013  . Type II or unspecified type diabetes mellitus with unspecified complication, not stated as uncontrolled 10/22/2012  . Bunion of left foot 11/25/2011  . Bunion, right foot  11/25/2011  . Polysubstance abuse ([place]) 07/28/2011  . Hip fracture, left ([place]) 04/04/2011  . Closed fracture of neck of femur ([place]) 04/04/2011  . Insomnia 02/18/2011  . CAD (coronary artery disease) 04/20/2009  . Acute on chronic combined systolic (congestive) and diastolic (congestive) heart failure ([place]) 04/20/2009  . HLD (hyperlipidemia) 11/20/2006  . Gout 11/20/2006  . TOBACCO ABUSE 11/20/2006  . Essential hypertension 11/20/2006  . Human immunodeficiency virus (HIV) disease ([place]) 09/02/2006   PCP:  [name], NP Pharmacy:   [facility] [place], [place] - [place] [address] Phone: [phone] Fax: [phone]  [place], [place] [facility] [place] [address] [place] [address] [place] [address] Phone: [phone] Fax: [phone]  [facility] [phone] - [place], [place] - [place] AT [name] [address] Phone: [phone] Fax: [phone]  [facility] [place], [place] [address] [place] [address] Phone: [phone] Fax: [phone]     Social Determinants of Health (SDOH) Interventions    Readmission Risk Interventions Readmission Risk Prevention Plan 07/22/2019  Transportation Screening Complete  Medication Review ([place]) Complete  HRI or [place] Complete  SW Recovery Care/Counseling Consult Complete  Palliative Care Screening Not [place] Not Applicable  Some recent data might be hidden

## 2019-07-22 NOTE — Progress Notes (Signed)
Family Medicine Teaching Service Daily Progress Note Intern Pager: [phone]  Patient name: Jacob [name] Medical record number: [id number] Date of birth: 03/06/1951 Age: 68 y.o. Gender: male  Primary Care Provider: [name], NP Consultants: Cardiology, Nephrology Code Status: Full  Pt Overview and Major Events to Date:  9/27- admitted, NSTEMI  Assessment and Plan: Jacob [name] is a 69 y.o. male presenting with dyspnea, chest pain. PMH is significant for CHF, CAD, cardiomyopathy, HIV, DMT2, substance use disorder.  NSTEMI  Atypical Chest pain, resolved  H/o CAD s/p CABG 2004 Patient denies any chest pain today.  Troponins peaked at 899 last night, EKG with signs of ST depression in V4, V5, indicative of NSTEMI.  Cardiology consulted and recommends heart angiogram, but concern for kidney function.  Per chart review, patient was supposed to have a heart cath in March of this year, but could not due to worsening kidney status.  Will consult nephrology for planning regarding dialysis: is HD appropriate to start at this time so heart cath can proceed, or not?  Patient started on heparin drip. Home meds include high intensity statin. - Admit to FPTS, cardiac telemetry, attending Dr. Ardelia Mems - continue home meds - cards consulted, appreciate recs - nephrology consulted, appreciate recommendations - continue heparin drip  Acute Hypoxic Respiratory Failure likely 2/2 CHF exacerbation  HFrEF (EF 40-45%): Patient feels much better today, currently satting in the mid 90s on 2 L nasal cannula.    Pulmonary exam reveals minimal crackles the bilateral mid lung fields and bases, with decreased aeration in the same areas.  Urine output overnight was 1.275 L, weight 199.2 lbs, decreased by 1.4 lbs. Patient does not appear fluid overloaded, chest x-ray with interstitial edema most likely due to NSTEMI and poor ejection fraction.  Creatinine increased from 5.5-5.79 overnight likely due to  Lasix.  Pt received total lasix 340mg  IV, lasix 160 mg PO yesterday.  Awaiting cardiology recommendations regarding cath, and nephrology input on whether cath is appropriate and when patient should start dialysis.  We will hold off on further diuresis until more input from consultants and rounding.  Patient has a long history of CAD, s/p CABG in 2004.  Home meds are Coreg 25 mg BID, Lasix 160 mg TID, Imdur 30 mg, nitroglycerin. - s/p total 340mg  IV lasix, 160mg  PO lasix yesterday - strict I/O's, daily weights - Continue heparin gtt  - Continuous pulse ox - O2 supplementation, maintain oxygenation >88% via [place] - AM CBC, CMP  - hold home meds  ESRD not on HD CKD Stage V since 02/2018.  AV fistula placed in October 2019 by Dr. Monica Martinez, [facility]. Follows with Dr. [name], Nephrology, for anemia of chronic disease. Phosphorus elevated to 5.9, magnesium 1.9.    Creatinine worsened to 5.79 overnight.  Creatinine  was near baseline at 5.55 on admission (4.94 on 06/24/19). Awaiting nephrology consult, re: dialysis or not. - monitor with daily BMP - nephology consult - low phosphorus diet  Anemia of Chronic Disease  H/O GI Bleed (01/2019)- stable Hemoglobin stable this morning at 8.5, 9.7 on admission. MCV elevated to 100.3.  No signs of bleeding on exam.  Patient hospitalized for acute GI bleed in April 2020. GI consulted at that time and EGD notable for few small AVMs in the stomach and duodenal bulb that were all destroyed successfully. Recommended colonoscopy at that time however patient refused. Patient follows with Dr. [name] for IV Feraheme q6 weeks.  Last iron panel July 2020: Iron,  TIBC, Ferritin WNL. Folate WNL in April 2020. Differential includes anemia of chronic kidney disease vs chronic alcohol use vs GI bleed. PT/INR 13.8, 1.1.  - follow with daily CBC - will monitor for signs of bleeding while on heparin gtt  Polysubstance Use Disorder Last reported intermittent cocaine use, last used 2  days ago. Smokes 1PPD x >45 years. Drinks ~6-32oz beers on weekends. Last drink 9/26. UDS reveals positive for opiates as well, no history of opiates given this admission or by EMS. - CIWAs 0 overnight - Nicotine patch PRN - AM EKG - avoid BB given recent cocaine use - UDS positive for opiates and cocaine - encourage cessation of drug use  Elevated LFTs AST, ALT mildly elevated to 95, 51 respectively.  Recently AST/ALT were 30, 25 respectively on 06/24/2019.  Patient reported that he drank six 32 ounce beers over the weekend, clear due to alcohol intake. - Monitor with CMP  T2DM: Home meds include: Toujeo 88U QD and Humalog 20U TID. On 9/4, his BG readings range from 130s to 220. Overnight BGs in 100s, sensitive SSI appropriate for now, will continue to monitor. - continue sensitive SSI - BG qAC, qHS, qAM fasting  COPD Has 45-pack-year smoking history.  He smokes a pack per day.  Home medication is Incruse Ellipta. -Continue home medications  HTN Blood pressures elevated on admission: 170-190's/90's. Overnight, blood pressure appropriate mostly 130s-150s/60s-90s. Most recently 142/68.  Home medications include hydralazine 3 times daily, Coreg.  -Continue hydralazine -Hold beta-blocker in setting of cocaine use  HIV Home med is Biktarvy. HIV-1 RNA quantification was not detectable on 06/24/2019. He follows with Dr. [name] at [facility]. Last appt 07/08/2019. Good control. -Continue home meds  Gout Home meds include colchicine and allopurinol. - hold colchicine - continue allopurinol  HLD Home meds are high intensity statin. -Continue atorvastatin 80 mg  FEN/GI: heart healthy/carb modified/renal diet PPx: heparin gtt  Disposition: to cardiac telemetry pending further work up and stabilization  Subjective:  Feels much better today.  He has no chest pain, his oxygen requirement has decreased from 6 L to 2 L.  While in the room, this provider turned patient down to 1 L nasal  cannula as he was setting in the high 90s to 100% oxygen.  He maintain saturations greater than 95% on 1 L nasal cannula.  Patient does not appear volume overloaded on exam, as he does not have a JVP, no pedal edema.  However on pulmonary exam he does have diminished aeration and crackles at the bilateral mid upper lobes and bases of his lungs.  Will discuss with nephrology and cardiology if best method to remove fluid from patient is dialysis as he has had intense Lasix diuresis with minimal output, and increased creatinine.  Objective: Temp:  [97.7 F (36.5 C)-98.9 F (37.2 C)] 97.7 F (36.5 C) (09/28 0338) Pulse Rate:  [86-117] 97 (09/28 0500) Resp:  [12-32] 20 (09/28 0338) BP: (131-193)/(63-98) 142/68 (09/28 0500) SpO2:  [96 %-100 %] 96 % (09/28 0745) Weight:  [90.4 kg-93.9 kg] 90.4 kg (09/28 0331) Physical Exam: General: Pleasant, older gentleman sitting comfortably on the side of his bed, NAD Cardiovascular: Regular rate and rhythm, no M/R/G Respiratory: Diminished aeration as well as crackles in the bilateral mid lung fields and bases. Abdomen: Soft, nondistended, nontender, normal bowel sounds Extremities: No pedal edema  Laboratory: Recent Labs  Lab 07/21/19 0928 07/21/19 0943 07/21/19 2330  WBC 7.2  --  6.1  HGB 9.7* 10.5* 8.5*  HCT  28.9* 31.0* 26.3*  PLT 200  --  195   Recent Labs  Lab 07/21/19 0928 07/21/19 0943 07/21/19 1821 07/21/19 2330  NA 140 141 135 135  K 4.5 4.6 4.4 4.2  CL 108 109 104 104  CO2 23  --  21* 20*  BUN 72* 68* 73* 74*  CREATININE 5.55* 5.60* 5.71* 5.79*  CALCIUM 9.0  --  8.6* 8.6*  PROT 7.7  --   --   --   BILITOT 0.7  --   --   --   ALKPHOS 42  --   --   --   ALT 51*  --   --   --   AST 95*  --   --   --   GLUCOSE 110* 100* 152* 149*    Imaging/Diagnostic Tests: No results found.  Gladys Damme, MD 07/22/2019, 8:35 AM PGY-1, [place] Intern pager: [phone], text pages welcome

## 2019-07-22 NOTE — Progress Notes (Signed)
New Home for Heparin Indication: chest pain/ACS  Heparin Dosing Weight: 91 kg  Labs: Recent Labs    07/21/19 0928 07/21/19 0943 07/21/19 1307 07/21/19 1821 07/21/19 1943 07/21/19 2330 07/22/19 0702  HGB 9.7* 10.5*  --   --   --  8.5*  --   HCT 28.9* 31.0*  --   --   --  26.3*  --   PLT 200  --   --   --   --  195  --   LABPROT 13.8  --   --   --   --   --   --   INR 1.1  --   --   --   --   --   --   HEPARINUNFRC  --   --   --  0.12*  --   --  0.14*  CREATININE 5.55* 5.60*  --  5.71*  --  5.79*  --   TROPONINIHS 284*  --  585*  --  899* 864*  --     Estimated Creatinine Clearance: 13.8 mL/min (A) (by C-G formula based on SCr of 5.79 mg/dL (H)).  Assessment: 63 yom presenting with SOB, elevated troponin. Pharmacy consulted to dose heparin for ACS. Pt is not on anticoagulation PTA. Hg 10.5, plt wnl. Noted hx of GIB documented in April 2020, but no current active bleed issues documented.  9/28 AM update:  Heparin level sub-therapeutic, no CBC drawn this AM  Goal of Therapy:  Heparin level 0.3-0.7 units/ml Monitor platelets by anticoagulation protocol: Yes   Plan:  Increase heparin to 1650 units/hr Recheck heparin level and CBC at [place], PharmD, [place] Pharmacist Phone: [phone]

## 2019-07-22 NOTE — Progress Notes (Signed)
Heard [name] outside his room in distress.  Upon talking with [name], he expressed that he was unsure about his treatment plan and wanted to talk with the doctor. [name] presented some of his frustrations. I spoke with the Unit Secretary and she stated that the charge nurse had sent a message to the doctor.  I told [name] about chaplain services and the support that we offer.  Will follow-up.

## 2019-07-22 NOTE — Progress Notes (Addendum)
Progress Note  Patient Name: Jacob [name] Date of Encounter: 07/22/2019  Primary Cardiologist: No primary care provider on file.   Subjective   No chest pain. Wants to go home. Remains on IV heparin.   Inpatient Medications    Scheduled Meds: . allopurinol  100 mg Oral Daily  . aspirin EC  81 mg Oral Daily  . atorvastatin  80 mg Oral Daily  . bictegravir-emtricitabine-tenofovir AF  1 tablet Oral Daily  . diclofenac sodium  2 g Topical QID  . gabapentin  300 mg Oral TID  . hydrALAZINE  25 mg Oral TID  . insulin aspart  0-9 Units Subcutaneous TID WC  . isosorbide mononitrate  30 mg Oral Daily  . umeclidinium-vilanterol  1 puff Inhalation Daily   Continuous Infusions: . sodium chloride 1,000 mL (07/21/19 1614)  . heparin 1,650 Units/hr (07/22/19 0853)   PRN Meds: sodium chloride, acetaminophen **OR** acetaminophen   Vital Signs    Vitals:   07/22/19 0331 07/22/19 0338 07/22/19 0500 07/22/19 0745  BP:  (!) 152/78 (!) 142/68   Pulse:  96 97   Resp:  20    Temp:  97.7 F (36.5 C)    TempSrc:  Oral    SpO2:  98%  96%  Weight: 90.4 kg     Height:        Intake/Output Summary (Last 24 hours) at 07/22/2019 1030 Last data filed at 07/22/2019 0626 Gross per 24 hour  Intake 1204.51 ml  Output 1277 ml  Net -72.49 ml   Last 3 Weights 07/22/2019 07/21/2019 07/21/2019  Weight (lbs) 199 lb 3.2 oz 201 lb 9.6 oz 207 lb  Weight (kg) 90.357 kg 91.445 kg 93.895 kg      Telemetry    SR-->ST - Personally Reviewed  ECG    SR, PVCs, Incomplete RBBB, biphasic TW in lateral leads - Personally Reviewed  Physical Exam  Older AAM, laying in bed.  GEN: No acute distress.   Neck: No JVD Cardiac: RRR, no murmurs, rubs, or gallops.  Respiratory: Diminished in lower lobes. GI: Soft, nontender, non-distended  MS: No edema; No deformity. Neuro:  Nonfocal  Psych: Normal affect   Labs    High Sensitivity Troponin:   Recent Labs  Lab 07/21/19 0928 07/21/19 1307 07/21/19  1943 07/21/19 2330  TROPONINIHS 284* 585* 899* 864*      Chemistry Recent Labs  Lab 07/21/19 0928 07/21/19 0943 07/21/19 1821 07/21/19 2330  NA 140 141 135 135  K 4.5 4.6 4.4 4.2  CL 108 109 104 104  CO2 23  --  21* 20*  GLUCOSE 110* 100* 152* 149*  BUN 72* 68* 73* 74*  CREATININE 5.55* 5.60* 5.71* 5.79*  CALCIUM 9.0  --  8.6* 8.6*  PROT 7.7  --   --   --   ALBUMIN 3.2*  --  2.9* 2.9*  AST 95*  --   --   --   ALT 51*  --   --   --   ALKPHOS 42  --   --   --   BILITOT 0.7  --   --   --   GFRNONAA 10*  --  9* 9*  GFRAA 11*  --  11* 11*  ANIONGAP 9  --  10 11     Hematology Recent Labs  Lab 07/21/19 0928 07/21/19 0943 07/21/19 2330  WBC 7.2  --  6.1  RBC 2.88*  --  2.61*  HGB 9.7* 10.5* 8.5*  HCT 28.9* 31.0* 26.3*  MCV 100.3*  --  100.8*  MCH 33.7  --  32.6  MCHC 33.6  --  32.3  RDW 15.0  --  15.2  PLT 200  --  195    BNP Recent Labs  Lab 07/21/19 0928  BNP 1,535.2*     DDimer No results for input(s): DDIMER in the last 168 hours.   Radiology    Dg Chest Port 1 View  Result Date: 07/21/2019 CLINICAL DATA:  Pt arrives via [facility] from home with c/o sob that began a few hours pta, pt also reports new onset of cough this am after he smoked. Ems states O2 sats were in the low 80s upon their arrival, O2 sats improved to 99% on NRB, lung s.*comment was truncated*sob EXAM: PORTABLE CHEST 1 VIEW COMPARISON:  Radiograph 02/06/2019 FINDINGS: Sternotomy wires overlie normal cardiac silhouette. Fine interstitial pattern throughout the lungs suggests interstitial edema. No clear pleural fluid. No focal consolidation. Remote RIGHT rib fractures. IMPRESSION: Interstitial edema pattern. Electronically Signed   By: Suzy Bouchard M.D.   On: 07/21/2019 09:29    Cardiac Studies   Cath: 02/07/19  IMPRESSIONS    1. The left ventricle has mild-moderately reduced systolic function, with an ejection fraction of 40-45%. The cavity size was normal. Mild basal septal  hypertrophy. Left ventricular diastolic Doppler parameters are consistent with pseudonormalization.  Elevated left ventricular end-diastolic pressure Left ventricular diffuse hypokinesis.  2. The right ventricle has normal systolic function. The cavity was normal. There is no increase in right ventricular wall thickness.  3. Left atrial size was severely dilated.  4. There was trivial MR, TR and PR.  Patient Profile     68 y.o. male with a hx of CAD s/p CABG 2005, cardiomyopathy LV EF 30% in 2015 now 40-45% (SPECT 2016 w/scar and no ischemia), CKD with Cr 5-6, HIV, HBV, GI bleed in 01/2019 requiring transfusion, cocaine use, HTN, HLD who presents with shortness of breath and chest tightness.  Assessment & Plan    1. NSTEMI: HsT peaked at 899, remains on IV heparin. Recent cocaine use per pt report over the weekend. Per notes, Dr. [name] has had discussion with the patient regarding cath in the past, but things are complicated by his renal disease. No chest pain currently. Nephrology consulted by primary team. Further work up once nephrology discussion. Manage medically for now.   2. CAD s/p CABG 05: on ASA, statin  3. CKD stage V: followed by Dr. [name] as an outpatient. Cr 5.7  4. Polysubstance abuse: recent use of cocaine over the weekend  For questions or updates, please contact [place] Please consult [email or web address] for contact info under   Signed, Reino Bellis, NP  07/22/2019, 10:30 AM    Patient seen, examined. Available data reviewed. Agree with findings, assessment, and plan as outlined by Reino Bellis, NP. On my exam: Vitals:   07/22/19 1228 07/22/19 1244  BP: (!) 161/79 (!) 162/83  Pulse: (!) 104 (!) 102  Resp: (!) 22   Temp: (!) 100.7 F (38.2 C) 99.9 F (37.7 C)  SpO2: 99% 95%   Pt is alert and oriented, NAD HEENT: normal Neck: JVP - normal, carotids 2+=  Lungs: inspiratory rales bilaterally CV: RRR without murmur or gallop Abd: soft, NT, Positive BS, no  hepatomegaly Ext: no C/C/E Skin: warm/dry no rash  His chart is reviewed.  He has been dealing with diastolic heart failure and symptoms of angina, while trying to defer cardiac  catheterization because of imminent need for hemodialysis.  He is back with acute on chronic combined systolic and diastolic heart failure, unstable angina symptoms, and elevated troponin.  This is in the setting of stage V chronic kidney disease and recurrent cocaine use.  To date he is been poorly responsive to high-dose diuretic therapy.  I think it is clear that hemodialysis is going to be necessary in the very near future.  He has crackles on his lung exam and interstitial edema pattern on chest x-ray.  He wants to go home in order to pay some bills and take care of things before proceeding with further evaluation.  I talked to him about the cardiac risks of delaying catheterization much longer.  I suspect he will have significant clinical improvement once he initiates hemodialysis.  I do not think there is much else we can do from a medical treatment perspective at this point.  I considered starting him on clopidogrel, but see that he had a gastrointestinal bleed earlier this year.  We will continue with low-dose aspirin for now.  We will follow with you.  Sherren Mocha, M.D. 07/22/2019 3:42 PM

## 2019-07-22 NOTE — Consult Note (Signed)
Reason for Consult: Chronic kidney disease stage V, possible need for coronary angiogram. Referring Physician: Chrisandra Netters MD (FPTS)  HPI:  68 year old African-American man with past medical history significant for hypertension, coronary artery disease, hepatitis B virus infection, HIV infection, congestive heart failure and chronic kidney disease stage V (baseline creatinine 5-6) who follows up with Dr. [name] at [facility].  He presented to the emergency room yesterday morning with progressive worsening of shortness of breath following cocaine use on Friday night as well as dietary indiscretions with high sodium/unrestricted fluid intake.  He reports continued adherence to his furosemide 120 mg 3 times a day.  He also reports some chest pressure/pain that improved with oxygen supplementation/furosemide in the emergency room.  He currently denies any active chest pain and is on heparin drip.  He denies any nausea, vomiting, diarrhea or dysgeusia.  He denies any abnormal limb jerking movements or pruritus.  He and Dr. [name] have had discussions regarding his proximity to dialysis and he is aware that this may be imminently required.  He understands the risk of needing hemodialysis after coronary angiogram/iodinated contrast exposure.  Past Medical History:  Diagnosis Date  . Acute respiratory failure ([place]) 03/01/2018  . Arthritis    "all over; mostly knees and back" (02/28/2018)  . CHF (congestive heart failure) (HCC)    not on any meds  . Chronic lower back pain    stenosis  . CKD (chronic kidney disease), stage V ([place])    [name] 02/28/2018  . Community acquired pneumonia 09/06/2013  . Coronary atherosclerosis of native coronary artery 2005   s/p surgery  . Drug abuse ([place])    hx; tested for cocaine as recently as 2/08. says he is not using drugs now - avoided defib. for this reason   . GERD (gastroesophageal reflux disease)    takes OTC meds as needed  . GI bleeding  02/06/2019  . Glaucoma    uses eye drops daily  . Hepatitis B 1968   "tx'd w/isolation; caught it from toilet stools in gym"  . History of blood transfusion 03/01/2019  . History of colon polyps    benign  . History of gout    takes Allopurinol daily as well as Colchicine-if needed (02/28/2018)  . History of kidney stones   . HTN (hypertension)    takes Coreg,Imdur.and Apresoline daily  . Human immunodeficiency virus (HIV) disease ([place]) dx'd 1995   takes Genvoya daily  . Hyperlipidemia    takes Atorvastatin daily  . Ischemic cardiomyopathy   . Muscle spasm    takes Zanaflex as needed  . Myocardial infarction ([place]) ~ 2004/2005  . Nocturia   . Peripheral neuropathy    takes gabapentin daily  . Pneumonia    "at least twice" (02/28/2018)  . Shortness of breath dyspnea    rarely but if notices it then with exertion  . Syphilis, unspecified   . Type II diabetes mellitus ([place]) 2004   Lantus daily.Average fasting blood sugar 125-199  . Wears glasses   . Wears partial dentures     Past Surgical History:  Procedure Laterality Date  . AV FISTULA PLACEMENT Left 08/02/2018   Procedure: ARTERIOVENOUS (AV) FISTULA CREATION  left arm radiocephlic;  Surgeon: Marty Heck, MD;  Location: [place];  Service: Vascular;  Laterality: Left;  . BACK SURGERY    . CARDIAC CATHETERIZATION  10/2002; 12/19/2004   [name] 03/08/2011  . COLONOSCOPY  2013   Dr.John Henrene Pastor   . CORONARY ARTERY  BYPASS GRAFT  02/24/2003   CABG X2/notes 03/08/2011  . ESOPHAGOGASTRODUODENOSCOPY (EGD) WITH PROPOFOL N/A 02/08/2019   Procedure: ESOPHAGOGASTRODUODENOSCOPY (EGD) WITH PROPOFOL;  Surgeon: Milus Banister, MD;  Location: [place];  Service: Gastroenterology;  Laterality: N/A;  . FRACTURE SURGERY    . HOT HEMOSTASIS N/A 02/08/2019   Procedure: HOT HEMOSTASIS (ARGON PLASMA COAGULATION/BICAP);  Surgeon: Milus Banister, MD;  Location: [facility] ENDOSCOPY;  Service: Gastroenterology;  Laterality: N/A;  . INTERTROCHANTERIC  HIP FRACTURE SURGERY Left 11/2006   [name] 03/08/2011  . LAPAROSCOPIC CHOLECYSTECTOMY  05/2006  . LIGATION OF COMPETING BRANCHES OF ARTERIOVENOUS FISTULA Left 11/05/2018   Procedure: LIGATION OF COMPETING BRANCHES OF ARTERIOVENOUS FISTULA  LEFT  ARM;  Surgeon: Marty Heck, MD;  Location: [place];  Service: Vascular;  Laterality: Left;  . LUMBAR LAMINECTOMY/DECOMPRESSION MICRODISCECTOMY N/A 02/29/2016   Procedure: Left L4-5 Lateral Recess Decompression, Removal Extradural Intraspinal Facet Cyst;  Surgeon: Marybelle Killings, MD;  Location: [place];  Service: Orthopedics;  Laterality: N/A;  . MULTIPLE TOOTH EXTRACTIONS    . ORIF MANDIBULAR FRACTURE Left 08/13/2004   ORIF of left body fracture mandible with KLS Martin 2.3-mm six hole/notes 03/08/2011    Family History  Problem Relation Age of Onset  . Heart failure Father   . Hypertension Father   . Diabetes Brother   . Heart attack Brother   . Alzheimer's disease Mother   . Stroke Sister   . Diabetes Sister   . Alzheimer's disease Sister   . Hypertension Brother   . Diabetes Brother   . Drug abuse Brother   . Colon cancer Neg Hx     Social History:  reports that he has been smoking cigarettes. He has a 43.00 pack-year smoking history. He has never used smokeless tobacco. He reports current alcohol use of about 12.0 standard drinks of alcohol per week. He reports that he does not use drugs.  Allergies:  Allergies  Allergen Reactions  . Augmentin [Amoxicillin-Pot Clavulanate] Diarrhea    Severe diarrhea   . Amphetamines Other (See Comments)    Unknown reaction type    Medications:  Scheduled: . allopurinol  100 mg Oral Daily  . aspirin EC  81 mg Oral Daily  . atorvastatin  80 mg Oral Daily  . bictegravir-emtricitabine-tenofovir AF  1 tablet Oral Daily  . diclofenac sodium  2 g Topical QID  . gabapentin  300 mg Oral TID  . hydrALAZINE  25 mg Oral TID  . insulin aspart  0-9 Units Subcutaneous TID WC  . isosorbide mononitrate  30  mg Oral Daily  . umeclidinium-vilanterol  1 puff Inhalation Daily    BMP Latest Ref Rng & Units 07/21/2019 07/21/2019 07/21/2019  Glucose 70 - 99 mg/dL 149(H) 152(H) 100(H)  BUN 8 - 23 mg/dL 74(H) 73(H) 68(H)  Creatinine 0.61 - 1.24 mg/dL 5.79(H) 5.71(H) 5.60(H)  BUN/Creat Ratio 6 - 22 (calc) - - -  Sodium 135 - 145 mmol/L 135 135 141  Potassium 3.5 - 5.1 mmol/L 4.2 4.4 4.6  Chloride 98 - 111 mmol/L 104 104 109  CO2 22 - 32 mmol/L 20(L) 21(L) -  Calcium 8.9 - 10.3 mg/dL 8.6(L) 8.6(L) -   CBC Latest Ref Rng & Units 07/21/2019 07/21/2019 07/21/2019  WBC 4.0 - 10.5 K/uL 6.1 - 7.2  Hemoglobin 13.0 - 17.0 g/dL 8.5(L) 10.5(L) 9.7(L)  Hematocrit 39.0 - 52.0 % 26.3(L) 31.0(L) 28.9(L)  Platelets 150 - 400 K/uL 195 - 200   BNP (last 3 results) Recent Labs  02/06/19 1401 07/21/19 0928  BNP 852.1* 1,535.2*    ProBNP (last 3 results) No results for input(s): PROBNP in the last 8760 hours.   Dg Chest Port 1 View  Result Date: 07/21/2019 CLINICAL DATA:  Pt arrives via g[facility]ms from home with c/o sob that began a few hours pta, pt also reports new onset of cough this am after he smoked. Ems states O2 sats were in the low 80s upon their arrival, O2 sats improved to 99% on NRB, lung s.*comment was truncated*sob EXAM: PORTABLE CHEST 1 VIEW COMPARISON:  Radiograph 02/06/2019 FINDINGS: Sternotomy wires overlie normal cardiac silhouette. Fine interstitial pattern throughout the lungs suggests interstitial edema. No clear pleural fluid. No focal consolidation. Remote RIGHT rib fractures. IMPRESSION: Interstitial edema pattern. Electronically Signed   By: Suzy Bouchard M.D.   On: 07/21/2019 09:29    Review of Systems  Constitutional: Positive for malaise/fatigue. Negative for chills and fever.  HENT: Negative.   Eyes: Negative.   Respiratory: Positive for shortness of breath. Negative for cough, hemoptysis and sputum production.   Cardiovascular: Positive for chest pain. Negative for palpitations,  orthopnea and leg swelling.  Gastrointestinal: Negative.   Genitourinary: Negative.   Musculoskeletal: Negative.   Neurological: Negative.    Blood pressure (!) 142/68, pulse 97, temperature 97.7 F (36.5 C), temperature source Oral, resp. rate 20, height 6\' 2"  (1.88 m), weight 90.4 kg, SpO2 96 %. Physical Exam  Nursing note and vitals reviewed. Constitutional: He is oriented to person, place, and time. He appears well-developed and well-nourished. No distress.  HENT:  Head: Normocephalic and atraumatic.  Mouth/Throat: Oropharynx is clear and moist.  Eyes: Pupils are equal, round, and reactive to light. Conjunctivae and EOM are normal. No scleral icterus.  Neck: Normal range of motion. Neck supple. JVD present.  10 cm JVP  Cardiovascular: Normal rate, regular rhythm and intact distal pulses.  No murmur heard. Respiratory: Effort normal and breath sounds normal. He has no wheezes. He has no rales.  GI: Soft. Bowel sounds are normal. There is no abdominal tenderness. There is no rebound.  Musculoskeletal:        General: No edema.     Comments: Left RCF, palpable thrill diminishing proximally  Neurological: He is alert and oriented to person, place, and time.  Skin: Skin is warm and dry. No rash noted.    Assessment/Plan: 1.  Acute exacerbation of diastolic heart failure: Most likely secondary to recent cocaine use and dietary indiscretions with unrestricted sodium intake/liberal fluid intake but cannot entirely rule out acute coronary event.  I had a lengthy discussion with him regarding the utility of coronary angiography and initiating dialysis for ongoing volume management but he declines citing the need to defer this until he has some social obligations taken care of.  If he remains unwilling to begin hemodialysis/coronary angiography at this time, discharged with plans to possibly initiate hemodialysis as an outpatient in the next 1 to 2 weeks. 2.  Chronic kidney disease stage V:  With volume overload that has so far been responsive to diuretic therapy.  Declines initiation of hemodialysis at this time due to social reasons understanding the risk.  He has a mature left RCF in place and states that he would need assistance in setting up transportation to the dialysis unit ([facility]).  He will need gradual down titration of gabapentin. 3.  Anemia of chronic kidney disease: Denies overt loss and recent records significant for GI bleeding with AVMs.  Resume IV Fe/ESA. 4.  Secondary hyperparathyroidism: Calcium and phosphorus levels currently acceptable, continue calcitriol for PTH suppression. 5.  HIV infection: Resume/continue antiretroviral therapy. 6.  Polysubstance abuse: Discussed need for cessation  Loxley Cibrian K. 07/22/2019, 11:08 AM

## 2019-07-22 NOTE — Progress Notes (Signed)
[place] for heparin Indication: chest pain/ACS  Heparin Dosing Weight: 91 kg  Labs: Recent Labs    07/21/19 0928 07/21/19 0943 07/21/19 1307 07/21/19 1821 07/21/19 1943 07/21/19 2330 07/22/19 0702 07/22/19 1645  HGB 9.7* 10.5*  --   --   --  8.5*  --  8.5*  HCT 28.9* 31.0*  --   --   --  26.3*  --  25.3*  PLT 200  --   --   --   --  195  --  182  LABPROT 13.8  --   --   --   --   --   --   --   INR 1.1  --   --   --   --   --   --   --   HEPARINUNFRC  --   --   --  0.12*  --   --  0.14* <0.10*  CREATININE 5.55* 5.60*  --  5.71*  --  5.79*  --   --   TROPONINIHS 284*  --  585*  --  899* 864*  --   --     Estimated Creatinine Clearance: 13.8 mL/min (A) (by C-G formula based on SCr of 5.79 mg/dL (H)).  Assessment: 53 yom presenting with SOB, elevated troponin. Pharmacy consulted to dose heparin for ACS. Pt is not on anticoagulation PTA. Noted hx of GIB documented in April 2020, but no current active bleed issues documented.  Heparin level has remained subtherapeutic and now undetectable. No infusion issues per nursing or S/Sx bleeding.  Goal of Therapy:  Heparin level 0.3-0.7 units/ml Monitor platelets by anticoagulation protocol: Yes   Plan:  Heparin 2000 unit bolus Increase heparin to 1850 units/h Recheck heparin level with morning labs   Arrie Senate, PharmD, BCPS Clinical Pharmacist [phone] Please check AMION for all [place] numbers 07/22/2019

## 2019-07-22 NOTE — Plan of Care (Signed)
  Problem: Elimination: Goal: Will not experience complications related to bowel motility Outcome: Completed/Met Goal: Will not experience complications related to urinary retention Outcome: Completed/Met   Problem: Skin Integrity: Goal: Risk for impaired skin integrity will decrease Outcome: Completed/Met

## 2019-07-23 ENCOUNTER — Inpatient Hospital Stay (HOSPITAL_COMMUNITY): Payer: Medicare Other

## 2019-07-23 ENCOUNTER — Ambulatory Visit: Payer: Medicare Other | Admitting: *Deleted

## 2019-07-23 ENCOUNTER — Ambulatory Visit: Payer: Medicare Other

## 2019-07-23 DIAGNOSIS — I214 Non-ST elevation (NSTEMI) myocardial infarction: Secondary | ICD-10-CM

## 2019-07-23 LAB — HEPARIN LEVEL (UNFRACTIONATED)
Heparin Unfractionated: 0.27 IU/mL — ABNORMAL LOW (ref 0.30–0.70)
Heparin Unfractionated: 0.31 IU/mL (ref 0.30–0.70)

## 2019-07-23 LAB — COMPREHENSIVE METABOLIC PANEL
ALT: 34 U/L (ref 0–44)
AST: 47 U/L — ABNORMAL HIGH (ref 15–41)
Albumin: 3 g/dL — ABNORMAL LOW (ref 3.5–5.0)
Alkaline Phosphatase: 43 U/L (ref 38–126)
Anion gap: 10 (ref 5–15)
BUN: 80 mg/dL — ABNORMAL HIGH (ref 8–23)
CO2: 22 mmol/L (ref 22–32)
Calcium: 9.3 mg/dL (ref 8.9–10.3)
Chloride: 106 mmol/L (ref 98–111)
Creatinine, Ser: 5.99 mg/dL — ABNORMAL HIGH (ref 0.61–1.24)
GFR calc Af Amer: 10 mL/min — ABNORMAL LOW (ref >60)
GFR calc non Af Amer: 9 mL/min — ABNORMAL LOW (ref >60)
Glucose, Bld: 128 mg/dL — ABNORMAL HIGH (ref 70–99)
Potassium: 4.5 mmol/L (ref 3.5–5.1)
Sodium: 138 mmol/L (ref 135–145)
Total Bilirubin: 1.2 mg/dL (ref 0.3–1.2)
Total Protein: 7.6 g/dL (ref 6.5–8.1)

## 2019-07-23 LAB — CBC
HCT: 28.4 % — ABNORMAL LOW (ref 39.0–52.0)
Hemoglobin: 9.1 g/dL — ABNORMAL LOW (ref 13.0–17.0)
MCH: 32.3 pg (ref 26.0–34.0)
MCHC: 32 g/dL (ref 30.0–36.0)
MCV: 100.7 fL — ABNORMAL HIGH (ref 80.0–100.0)
Platelets: 202 10*3/uL (ref 150–400)
RBC: 2.82 MIL/uL — ABNORMAL LOW (ref 4.22–5.81)
RDW: 15 % (ref 11.5–15.5)
WBC: 8.7 10*3/uL (ref 4.0–10.5)
nRBC: 0 % (ref 0.0–0.2)

## 2019-07-23 LAB — GLUCOSE, CAPILLARY
Glucose-Capillary: 131 mg/dL — ABNORMAL HIGH (ref 70–99)
Glucose-Capillary: 204 mg/dL — ABNORMAL HIGH (ref 70–99)
Glucose-Capillary: 208 mg/dL — ABNORMAL HIGH (ref 70–99)
Glucose-Capillary: 167 mg/dL — ABNORMAL HIGH (ref 70–99)

## 2019-07-23 LAB — SARS CORONAVIRUS 2 (TAT 6-24 HRS): SARS Coronavirus 2: NEGATIVE

## 2019-07-23 MED ORDER — BICTEGRAVIR-EMTRICITAB-TENOFOV 50-200-25 MG PO TABS: 1.0000 | ORAL_TABLET | Freq: Every evening | ORAL | Status: DC

## 2019-07-23 MED ORDER — DOXYCYCLINE HYCLATE 100 MG PO TABS
100.0000 mg | ORAL_TABLET | Freq: Two times a day (BID) | ORAL | Status: DC
  Administered 2019-07-24 (×2): 100 mg via ORAL
  Filled 2019-07-23 (×2): qty 1

## 2019-07-23 MED ORDER — CARVEDILOL 25 MG PO TABS
25.0000 mg | ORAL_TABLET | Freq: Two times a day (BID) | ORAL | Status: DC
  Administered 2019-07-24: 25 mg via ORAL
  Filled 2019-07-23: qty 1

## 2019-07-23 MED ORDER — GABAPENTIN 300 MG PO CAPS: 300.0000 mg | ORAL_CAPSULE | Freq: Two times a day (BID) | ORAL | Status: DC

## 2019-07-23 MED ORDER — SIMETHICONE 80 MG PO CHEW
80.0000 mg | CHEWABLE_TABLET | Freq: Four times a day (QID) | ORAL | Status: DC | PRN
  Administered 2019-07-23 – 2019-07-29 (×4): 80 mg via ORAL
  Filled 2019-07-23 (×4): qty 1

## 2019-07-23 MED ORDER — BICTEGRAVIR-EMTRICITAB-TENOFOV 50-200-25 MG PO TABS
1.0000 | ORAL_TABLET | Freq: Every evening | ORAL | Status: DC
  Filled 2019-07-23: qty 1

## 2019-07-23 MED ORDER — SODIUM CHLORIDE 0.9 % IV SOLN
2.0000 g | INTRAVENOUS | Status: DC
  Filled 2019-07-23: qty 20

## 2019-07-23 MED ORDER — GABAPENTIN 300 MG PO CAPS
300.0000 mg | ORAL_CAPSULE | Freq: Every day | ORAL | Status: DC
  Administered 2019-07-24 – 2019-07-30 (×7): 300 mg via ORAL
  Filled 2019-07-23 (×9): qty 1

## 2019-07-23 MED ORDER — SODIUM CHLORIDE 0.9 % IV SOLN
2.0000 g | INTRAVENOUS | Status: DC
  Administered 2019-07-24 (×2): 2 g via INTRAVENOUS
  Filled 2019-07-23 (×2): qty 20
  Filled 2019-07-23: qty 2

## 2019-07-23 MED ORDER — CHLORHEXIDINE GLUCONATE CLOTH 2 % EX PADS
6.0000 | MEDICATED_PAD | Freq: Every day | CUTANEOUS | Status: DC
  Administered 2019-07-23 – 2019-08-01 (×6): 6 via TOPICAL

## 2019-07-23 MED ORDER — BICTEGRAVIR-EMTRICITAB-TENOFOV 50-200-25 MG PO TABS
1.0000 | ORAL_TABLET | Freq: Every evening | ORAL | Status: DC
  Administered 2019-07-24 – 2019-07-31 (×9): 1 via ORAL
  Filled 2019-07-23 (×10): qty 1

## 2019-07-23 NOTE — Progress Notes (Signed)
[name], male   DOB: 08/31/1951, 68 y.o.   MRN: [id number] [facility] Progress Note   Assessment/ Plan:   1.  Acute exacerbation of diastolic heart failure:  Secondary to recent cocaine use and indiscretions with sodium/fluid intake.  Overnight with worsening shortness of breath and net fluid positive balance on diuretics.  I had extensive discussion again today and he is willing to begin hemodialysis understanding that this will be chronic. 2.  Chronic kidney disease stage V-now progression to end-stage renal disease:  With volume overload/shortness of breath requiring escalation of diuretic therapy and now relative resistance.  We will begin hemodialysis today via his left radiocephalic fistula which appears to be on the smaller side but may be useful (if not, will need TDC).  Will begin process for outpatient dialysis unit placement. 3.  Anemia of chronic kidney disease: Denies overt loss and recent records significant for GI bleeding with AVMs.  Resume IV Fe/ESA. 4.  Secondary hyperparathyroidism: Calcium and phosphorus levels currently acceptable, continue calcitriol for PTH suppression. 5.  HIV infection: Resume/continue antiretroviral therapy. 6.  Polysubstance abuse: Discussed need for cessation  Subjective:   Reports to be having a rough morning with shortness of breath, pain in his flanks/abdomen and is today willing to start dialysis.   Objective:   BP (!) 188/98 (BP Location: Right Arm)   Pulse (!) 114   Temp 99.7 F (37.6 C) (Oral)   Resp (!) 22   Ht 6\' 2"  (1.88 m)   Wt 89.9 kg   SpO2 97%   BMI 25.46 kg/m   Intake/Output Summary (Last 24 hours) at 07/23/2019 0831 Last data filed at 07/23/2019 7341 Gross per 24 hour  Intake 1839.11 ml  Output 1426 ml  Net 413.11 ml   Weight change: -3.946 kg  Physical Exam: Gen: Appears to be uncomfortable sitting on the edge of his bed eating [place] ice CVS: Pulse regular tachycardia, S1 and S2  normal Resp: Fine rales over both lung fields, no rhonchi Abd: Soft, obese, mildly tender over epigastric area Ext: Trace lower extremity edema, left RCF with palpable thrill  Imaging: Dg Chest 2 View  Result Date: 07/22/2019 CLINICAL DATA:  Respiratory distress.  Cough. EXAM: CHEST - 2 VIEW COMPARISON:  July 21, 2019 FINDINGS: Stable cardiomegaly. The hila and mediastinum are normal. No pneumothorax. Diffuse bilateral pulmonary opacities are identified, worrisome for edema. More focal opacity in the medial right lung base is stable to mildly more prominent which could represent consolidative edema versus a superimposed infiltrate. IMPRESSION: Pulmonary edema. More focal opacity in the medial right lung base is stable to slightly more prominent. This could be part of the pulmonary edema or a superimposed infection. Electronically Signed   By: Dorise Bullion III M.D   On: 07/22/2019 15:07   Dg Chest Port 1 View  Result Date: 07/21/2019 CLINICAL DATA:  Pt arrives via [facility] from home with c/o sob that began a few hours pta, pt also reports new onset of cough this am after he smoked. Ems states O2 sats were in the low 80s upon their arrival, O2 sats improved to 99% on NRB, lung s.*comment was truncated*sob EXAM: PORTABLE CHEST 1 VIEW COMPARISON:  Radiograph 02/06/2019 FINDINGS: Sternotomy wires overlie normal cardiac silhouette. Fine interstitial pattern throughout the lungs suggests interstitial edema. No clear pleural fluid. No focal consolidation. Remote RIGHT rib fractures. IMPRESSION: Interstitial edema pattern. Electronically Signed   By: Suzy Bouchard M.D.   On: 07/21/2019 09:29  Labs: BMET Recent Labs  Lab 07/21/19 0928 07/21/19 0943 07/21/19 1821 07/21/19 2330 07/23/19 0608  NA 140 141 135 135 138  K 4.5 4.6 4.4 4.2 4.5  CL 108 109 104 104 106  CO2 23  --  21* 20* 22  GLUCOSE 110* 100* 152* 149* 128*  BUN 72* 68* 73* 74* 80*  CREATININE 5.55* 5.60* 5.71* 5.79* 5.99*   CALCIUM 9.0  --  8.6* 8.6* 9.3  PHOS 5.1*  --  4.5 4.5  --    CBC Recent Labs  Lab 07/21/19 0928 07/21/19 0943 07/21/19 2330 07/22/19 1645 07/23/19 0608  WBC 7.2  --  6.1 7.9 8.7  NEUTROABS 5.6  --   --   --   --   HGB 9.7* 10.5* 8.5* 8.5* 9.1*  HCT 28.9* 31.0* 26.3* 25.3* 28.4*  MCV 100.3*  --  100.8* 99.2 100.7*  PLT 200  --  195 182 202   Medications:    . allopurinol  100 mg Oral Daily  . aspirin EC  81 mg Oral Daily  . atorvastatin  80 mg Oral Daily  . bictegravir-emtricitabine-tenofovir AF  1 tablet Oral Daily  . diclofenac sodium  2 g Topical QID  . gabapentin  300 mg Oral TID  . hydrALAZINE  25 mg Oral TID  . insulin aspart  0-9 Units Subcutaneous TID WC  . isosorbide mononitrate  30 mg Oral Daily  . umeclidinium-vilanterol  1 puff Inhalation Daily   Elmarie Shiley, MD 07/23/2019, 8:31 AM

## 2019-07-23 NOTE — Progress Notes (Signed)
Pt + bm, stil intermittent stomach pain, nephro in to see pt and going to HD, discussed with pt and holding meds until after HD esp bp meds, pt emotional and tearful, spoke to significant other on phone , also consult placed per pt request for chaplain consult

## 2019-07-23 NOTE — Progress Notes (Signed)
ANTICOAGULATION CONSULT NOTE  Pharmacy Consult for heparin Indication: chest pain/ACS  Heparin Dosing Weight: 91 kg  Labs: Recent Labs    07/21/19 0928  07/21/19 1307  07/21/19 1821 07/21/19 1943 07/21/19 2330 07/22/19 0702 07/22/19 1645 07/23/19 0608  HGB 9.7*   < >  --   --   --   --  8.5*  --  8.5* 9.1*  HCT 28.9*   < >  --   --   --   --  26.3*  --  25.3* 28.4*  PLT 200  --   --   --   --   --  195  --  182 202  LABPROT 13.8  --   --   --   --   --   --   --   --   --   INR 1.1  --   --   --   --   --   --   --   --   --   HEPARINUNFRC  --   --   --    < > 0.12*  --   --  0.14* <0.10* 0.27*  CREATININE 5.55*   < >  --   --  5.71*  --  5.79*  --   --  5.99*  TROPONINIHS 284*  --  585*  --   --  899* 864*  --   --   --    < > = values in this interval not displayed.    Estimated Creatinine Clearance: 13.3 mL/min (A) (by C-G formula based on SCr of 5.99 mg/dL (H)).  Assessment: 3 yom presenting with SOB, elevated troponin. Pharmacy consulted to dose heparin for ACS. Pt is not on anticoagulation PTA. Noted hx of GIB documented in April 2020, but no current active bleed issues documented.   Heparin level undetectable 9/28, received bolus and increased rate. Heparin level this morning subtherapeutic, no issues with line or lab draw. No bleeding reported, H&H stable.  Goal of Therapy:  Heparin level 0.3-0.7 units/ml Monitor platelets by anticoagulation protocol: Yes   Plan:  Increase heparin to 2000 units/hour 6-hour HL Daily heparin level with morning labs     Thank you for allowing pharmacy to participate in this patient's care.  Reika Callanan L. Devin Going, PharmD, [place] PGY1 Pharmacy Resident  @TODAY @      7:18 AM  Please check AMION for all [place] phone numbers After 10:00 PM, call the [place] [phone]

## 2019-07-23 NOTE — Progress Notes (Signed)
[name] expressed that he is in a lot of physical pain concerning his stomach, as well as frustrated with his condition and being in the hospital.  He feels as if he is not being listened to or cared for accordingly.  I also checked-in with his nurse to explain [name]'s feelings. [name] seems to have come to terms with undergoing dialysis and stated, "I just want them to get it over with." He also expressed feeling tired and overwhelmed by all of the tests and procedures happening.  I offered [name] spiritual support, presence, listening and prayer.  I let [name] know to have me paged if needed.

## 2019-07-23 NOTE — Progress Notes (Addendum)
EWS 3, HR st 110's, rr 22, pt anxiios post meal with c/o stomach pain that he gets after he eats "I thinks its gas but it wont pass" no prn or schedule meds for GI, paged FM intern to advise of ews 3 and st and c/o abd  Pain, order for mylicon

## 2019-07-23 NOTE — Progress Notes (Signed)
Renal Navigator was notified by Dr. Patel/Nephrologist to refer patient for OP HD treatment. Navigator attempted to meet with patient, but he was being wheeled out of his room to ultrasound at this time. Renal Navigator will attempt again at a later time.  Jacob [name], [place] Renal Navigator [phone]

## 2019-07-23 NOTE — Progress Notes (Signed)
Pt had previous MEWS score yellow.  Pt has been retested for COVID 19. Results are negative. RN will follow MEWS score guidelines.

## 2019-07-23 NOTE — Progress Notes (Signed)
Pt returned from u/s, pt had taken urinal for urine sample as ordered, the urinal had knocked off bed rail when pt returned, advised pt to save next sample and let us know for ua/cx sample, verbalized understanidng, pt was npo for u/s, pt asking for po to drink, advised I would page for order

## 2019-07-23 NOTE — Progress Notes (Signed)
Mews 2, pt awaits hemodialysis, meds held per dialysis protocol, cards was in to see pt with earlier vital signs and mews 3, meds to restart after dialysis ie coreg, pt c/o abdominal distention, instructed the HD will help get excess fluid off

## 2019-07-23 NOTE — Progress Notes (Signed)
Pt asking about HD, called HD and they are still with patients on machines and will call when they are ready, pt instructed and verbalized understanding

## 2019-07-23 NOTE — Progress Notes (Addendum)
ANTICOAGULATION CONSULT NOTE  Pharmacy Consult for heparin Indication: chest pain/ACS  Heparin Dosing Weight: 91 kg  Labs: Recent Labs    07/21/19 0928  07/21/19 1307 07/21/19 1821 07/21/19 1943 07/21/19 2330  07/22/19 1645 07/23/19 0608 07/23/19 1618  HGB 9.7*   < >  --   --   --  8.5*  --  8.5* 9.1*  --   HCT 28.9*   < >  --   --   --  26.3*  --  25.3* 28.4*  --   PLT 200  --   --   --   --  195  --  182 202  --   LABPROT 13.8  --   --   --   --   --   --   --   --   --   INR 1.1  --   --   --   --   --   --   --   --   --   HEPARINUNFRC  --   --   --  0.12*  --   --    < > <0.10* 0.27* 0.31  CREATININE 5.55*   < >  --  5.71*  --  5.79*  --   --  5.99*  --   TROPONINIHS 284*  --  585*  --  899* 864*  --   --   --   --    < > = values in this interval not displayed.    Estimated Creatinine Clearance: 13.3 mL/min (A) (by C-G formula based on SCr of 5.99 mg/dL (H)).  Assessment: 68 yr old meal presenting with SOB, elevated troponin. Pharmacy consulted to dose heparin for ACS. Pt is not on anticoagulation PTA. Noted hx of GIB documented in April 2020, but no current active bleed issues documented.   Heparin level this AM was below goal range (0.27 units/ml); heparin infusion increased to 2000 units/hr. Heparin level drawn ~8 hrs after rate increase to 2000 units/hr was 0.31 units/ml, which is at the low end of the goal range for this pt.  H/H stable, platelets 202  Goal of Therapy:  Heparin level 0.3-0.7 units/ml Monitor platelets by anticoagulation protocol: Yes   Plan:  Increase heparin infusion to 2100 units/hr since heparin level at low of goal range Check 8-hr heparin level Daily heparin level, CBC with morning labs Monitor for signs/symptoms of bleeding  Gillermina Hu, PharmD, BCPS, [facility] Clinical Pharmacist

## 2019-07-23 NOTE — Progress Notes (Signed)
Pt asking about HD, I called to inquire for pt request, informed emergency so he will be around 6pm, pt states " this is crazy. If they cant do it soon then it can be first thing in the am" I explained the importance of getting fluid off to help hiim breath better

## 2019-07-23 NOTE — Progress Notes (Signed)
Progress Note  Patient Name: Jacob [name] Date of Encounter: 07/23/2019  Primary Cardiologist: [name]  Subjective   No chest pain currently. Febrile this afternoon.   Inpatient Medications    Scheduled Meds: . allopurinol  100 mg Oral Daily  . aspirin EC  81 mg Oral Daily  . atorvastatin  80 mg Oral Daily  . bictegravir-emtricitabine-tenofovir AF  1 tablet Oral QPM  . Chlorhexidine Gluconate Cloth  6 each Topical Q0600  . gabapentin  300 mg Oral BID  . hydrALAZINE  25 mg Oral TID  . insulin aspart  0-9 Units Subcutaneous TID WC  . isosorbide mononitrate  30 mg Oral Daily  . umeclidinium-vilanterol  1 puff Inhalation Daily   Continuous Infusions: . sodium chloride 1,000 mL (07/22/19 2100)  . heparin 2,000 Units/hr (07/23/19 1159)   PRN Meds: sodium chloride, acetaminophen **OR** acetaminophen, guaiFENesin, simethicone   Vital Signs    Vitals:   07/23/19 0748 07/23/19 1000 07/23/19 1136 07/23/19 1137  BP: (!) 188/98 (!) 168/108 (!) 159/81 (!) 159/81  Pulse: (!) 114 (!) 124 (!) 121 (!) 121  Resp: (!) 22 20 20 20   Temp: 99.7 F (37.6 C)  (!) 100.8 F (38.2 C) (!) 100.8 F (38.2 C)  TempSrc: Oral  Oral Oral  SpO2: 97% 97% 90% 90%  Weight:      Height:        Intake/Output Summary (Last 24 hours) at 07/23/2019 1239 Last data filed at 07/23/2019 1200 Gross per 24 hour  Intake 1674.19 ml  Output 1326 ml  Net 348.19 ml   Last 3 Weights 07/23/2019 07/22/2019 07/21/2019  Weight (lbs) 198 lb 4.8 oz 199 lb 3.2 oz 201 lb 9.6 oz  Weight (kg) 89.948 kg 90.357 kg 91.445 kg      Telemetry    Sinus tach - Personally Reviewed  ECG    No EKG today  Physical Exam   General: Well developed, well nourished, NAD  HEENT: OP clear, mucus membranes moist  SKIN: warm, dry. No rashes. Neuro: No focal deficits  Musculoskeletal: Muscle strength 5/5 all ext  Psychiatric: Mood and affect normal  Neck: + JVD  Lungs:Clear bilaterally, no wheezes, rhonci, crackles  Cardiovascular: Tachy, regular. No murmurs, gallops or rubs. Abdomen:Soft. Bowel sounds present.  Extremities: No lower extremity edema.    Labs    High Sensitivity Troponin:   Recent Labs  Lab 07/21/19 0928 07/21/19 1307 07/21/19 1943 07/21/19 2330  TROPONINIHS 284* 585* 899* 864*      Chemistry Recent Labs  Lab 07/21/19 0928  07/21/19 1821 07/21/19 2330 07/23/19 0608  NA 140   < > 135 135 138  K 4.5   < > 4.4 4.2 4.5  CL 108   < > 104 104 106  CO2 23  --  21* 20* 22  GLUCOSE 110*   < > 152* 149* 128*  BUN 72*   < > 73* 74* 80*  CREATININE 5.55*   < > 5.71* 5.79* 5.99*  CALCIUM 9.0  --  8.6* 8.6* 9.3  PROT 7.7  --   --   --  7.6  ALBUMIN 3.2*  --  2.9* 2.9* 3.0*  AST 95*  --   --   --  47*  ALT 51*  --   --   --  34  ALKPHOS 42  --   --   --  43  BILITOT 0.7  --   --   --  1.2  GFRNONAA 10*  --  9* 9* 9*  GFRAA 11*  --  11* 11* 10*  ANIONGAP 9  --  10 11 10    < > = values in this interval not displayed.     Hematology Recent Labs  Lab 07/21/19 2330 07/22/19 1645 07/23/19 0608  WBC 6.1 7.9 8.7  RBC 2.61* 2.55* 2.82*  HGB 8.5* 8.5* 9.1*  HCT 26.3* 25.3* 28.4*  MCV 100.8* 99.2 100.7*  MCH 32.6 33.3 32.3  MCHC 32.3 33.6 32.0  RDW 15.2 15.2 15.0  PLT 195 182 202    BNP Recent Labs  Lab 07/21/19 0928  BNP 1,535.2*     DDimer No results for input(s): DDIMER in the last 168 hours.   Radiology    Dg Chest 2 View  Result Date: 07/22/2019 CLINICAL DATA:  Respiratory distress.  Cough. EXAM: CHEST - 2 VIEW COMPARISON:  July 21, 2019 FINDINGS: Stable cardiomegaly. The hila and mediastinum are normal. No pneumothorax. Diffuse bilateral pulmonary opacities are identified, worrisome for edema. More focal opacity in the medial right lung base is stable to mildly more prominent which could represent consolidative edema versus a superimposed infiltrate. IMPRESSION: Pulmonary edema. More focal opacity in the medial right lung base is stable to slightly more  prominent. This could be part of the pulmonary edema or a superimposed infection. Electronically Signed   By: Dorise Bullion III M.D   On: 07/22/2019 15:07   Dg Abd 1 View  Result Date: 07/23/2019 CLINICAL DATA:  Persistent abdominal pain EXAM: ABDOMEN - 1 VIEW COMPARISON:  CT 11/03/2015 FINDINGS: Vascular calcifications in the pelvis. Prior cholecystectomy. No bowel obstruction, free air, or organomegaly. IMPRESSION: No acute findings. Electronically Signed   By: Rolm Baptise M.D.   On: 07/23/2019 11:17    Cardiac Studies   Cath: 02/07/19  IMPRESSIONS    1. The left ventricle has mild-moderately reduced systolic function, with an ejection fraction of 40-45%. The cavity size was normal. Mild basal septal hypertrophy. Left ventricular diastolic Doppler parameters are consistent with pseudonormalization.  Elevated left ventricular end-diastolic pressure Left ventricular diffuse hypokinesis.  2. The right ventricle has normal systolic function. The cavity was normal. There is no increase in right ventricular wall thickness.  3. Left atrial size was severely dilated.  4. There was trivial MR, TR and PR.  Patient Profile     68 y.o. male with a hx of CAD s/p CABG 2005, cardiomyopathy LV EF 30% in 2015 now 40-45% (SPECT 2016 w/scar and no ischemia), CKD with Cr 5-6, HIV, HBV, GI bleed in 01/2019 requiring transfusion, cocaine use, HTN, HLD who presents with shortness of breath and chest tightness.   Assessment & Plan    1. CAD s/p CABG/NSTEMI: HsT peaked at 899. Discussions around cardiac cath given symptoms of unstable angina and mildly elevated troponin. This has been postponed due to his worsening renal function. He is starting dialysis today. Will continue to follow. Further discussion this week regarding possible cardiac cath now that he is on HD.   2. Ischemic cardiomyopathy/Acute on chronic systolic and diastolic CHF: Volume removal with HD today. Will need to resume Coreg. Unclear why  this was held. Will restart with pm dose after dialysis today.   3.  CKD stage V: Pt starting on HD today.   4. Polysubstance abuse: recent use of cocaine over the weekend. Cessation advised.   For questions or updates, please contact [place] Please consult [email or web address] for contact info under   Signed, [name], MD  07/23/2019,  12:39 PM

## 2019-07-23 NOTE — Progress Notes (Signed)
Family Medicine Teaching Service Daily Progress Note Intern Pager: [phone]  Patient name: Jacob [name] Medical record number: [id number] Date of birth: Oct 03, 1951 Age: 68 y.o. Gender: male  Primary Care Provider: [name], NP Consultants: Cardiology, Nephrology Code Status: Full  Pt Overview and Major Events to Date:  9/27- admitted, NSTEMI 9/29- to start HD  Assessment and Plan: Jacob [name] is a 68 y.o. male presenting with dyspnea, chest pain. PMH is significant for CHF, CAD, cardiomyopathy, HIV, DMT2, substance use disorder.  NSTEMI  Atypical Chest pain, resolved  H/o CAD s/p CABG 2004 Patient denies chest pain today.  Cardiology consulted and recommends heart angiogram, but concern for kidney function.  Per nephrology, patient to have HD today.  Will follow up with cardiology re: plans for angiogram.  Patient on heparin drip. Home meds include high intensity statin. - Admit to FPTS, cardiac telemetry, attending Dr. Ardelia Mems - continue home meds - cards consulted, appreciate recs - nephrology consulted, appreciate recommendations - continue heparin drip  Acute Hypoxic Respiratory Failure likely 2/2 CHF exacerbation  HFrEF (EF 40-45%): Patient appears much worse today on exam, he had a fever yesterday, and repeat fever today to 100.9 F.  Pulmonary exam reveals minimal crackles the bilateral mid lung fields and bases, with decreased aeration in the same areas.  Urine output overnight was 1.4 L, patient received 160 mg IV Lasix yesterday.  Patient also has newly distended abdomen that is globally tender, no signs of peritonitis, no HSM, no known liver disease, increasing concern for volume overload.  Patient is getting HD today, expect this to help his pain and distention.  Patient also has worsening tachypnea to the 30s, requiring 3 L oxygen with SpO2 90%.  Since this is a new exam finding of abdominal distention, will order KUB and abdominal ultrasound to rule out  SBP, SBO, ascites.  Patient reports having a bowel movement 1 hour ago. Home meds are Coreg 25 mg BID, Lasix 160 mg TID, Imdur 30 mg, nitroglycerin. - s/p total 340mg  IV lasix, 160mg  PO lasix yesterday - strict I/O's, daily weights - Continue heparin gtt  - Continuous pulse ox - O2 supplementation, maintain oxygenation >88% via [place] - AM CBC, CMP  - hold home meds - KUB, US abdominal complete  Concern for CAP Patient with new onset cough since yesterday, 2 fevers to 100.9*F in the last 24 hours, worsening tachypnea today and oxygen requirement of 3 L (does not have oxygen at home).  CXR reveals possible new infiltrate versus pulmonary edema. Patient does have known HFrEF of 40 to 45% with recent NSTEMI, however due to fevers we will cover for CAP.  For HD later today. -start coverage for CAP, will discuss on rounds which abx best for recent NSTEMI -monitor fever curve, sx   ESRD not on HD CKD Stage V since 02/2018.  AV fistula placed in October 2019 by Dr. Monica Martinez, [facility]. Follows with Dr. [name], Nephrology, for anemia of chronic disease. Phosphorus elevated to 5.9, magnesium 1.9.    Creatinine worsened to 5.99 overnight.  Patient decided to start dialysis, nephrology note indicates dialysis will be today. - monitor with daily BMP - nephology consult  Anemia of Chronic Disease  H/O GI Bleed (01/2019)- stable Hemoglobin stable this morning at 9.1, 9.7 on admission. MCV elevated to 100.3.  No signs of bleeding on exam.  Patient hospitalized for acute GI bleed in April 2020. GI consulted at that time and EGD notable for few small AVMs  in the stomach and duodenal bulb that were all destroyed successfully. Recommended colonoscopy at that time however patient refused. Patient follows with Dr. [name] for IV Feraheme q6 weeks.  Last iron panel July 2020: Iron, TIBC, Ferritin WNL. Folate WNL in April 2020. Differential includes anemia of chronic kidney disease vs chronic alcohol use vs GI bleed.  PT/INR 13.8, 1.1.  - follow with daily CBC - will monitor for signs of bleeding while on heparin gtt  Polysubstance Use Disorder Last reported intermittent cocaine use, last used Saturday. Smokes 1PPD x >45 years. Drinks ~6-32oz beers on weekends. Last drink 9/26. UDS reveals positive for opiates as well, no history of opiates given this admission or by EMS. - CIWAs 0 overnight - Nicotine patch PRN - AM EKG - avoid BB given recent cocaine use - UDS positive for opiates and cocaine - encourage cessation of drug use  Elevated LFTs-resolved AST, ALT improved to 47, 34.  Patient reported that he drank six 32 ounce beers over the weekend, clear due to alcohol intake. - Monitor with CMP  T2DM: Home meds include: Toujeo 88U QD and Humalog 20U TID. Overnight BGs in 100s, sensitive SSI appropriate for now, will continue to monitor. - continue sensitive SSI - BG qAC, qHS, qAM fasting  COPD Has 45-pack-year smoking history.  He smokes a pack per day.  Home medication is Incruse Ellipta. -Continue home medications  HTN Blood pressures elevated on admission: 170-190's/90's. Overnight, blood pressure appropriate mostly 146-188/60s-90s. Most recently 188/89.  Home medications include hydralazine 3 times daily, Coreg.  For HD today. -Continue hydralazine - can restart Coreg now 3 days out from last cocaine use.  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 HIV-1 RNA quantification was not detectable on 06/24/2019. He follows with Dr. [name] at [facility]. Last appt 07/08/2019. Good control. -Continue home meds  Gout Home meds include colchicine and allopurinol. - hold colchicine - continue allopurinol  HLD Home meds are high intensity statin. -Continue atorvastatin 80 mg  FEN/GI: heart healthy/carb modified/renal diet PPx: heparin gtt  Disposition: to cardiac telemetry pending further work up and stabilization  Subjective:  Patient both feels and looks worse today.  He is lying in bed with tachypnea,  increased oxygen requirement to 3 L, and satting around 90%.  He has a new cough, he was febrile overnight, and he has a nearly distended belly with non-localized pain.  He has decided he will start HD, nephrology notes that he will go today.  Expect he will feel much better after dialysis.  Objective: Temp:  [99.4 F (37.4 C)-100.9 F (38.3 C)] 99.7 F (37.6 C) (09/29 0748) Pulse Rate:  [86-114] 114 (09/29 0748) Resp:  [17-22] 22 (09/29 0748) BP: (146-188)/(69-98) 188/98 (09/29 0748) SpO2:  [95 %-100 %] 97 % (09/29 0748) Weight:  [89.9 kg] 89.9 kg (09/29 0208) Physical Exam: General: Pleasant, older gentleman lying in bed, ill-appearing, NAD Cardiovascular: Regular rate and rhythm, no M/R/G Respiratory: tachypneic, Diminished aeration as well as crackles in the bilateral mid lung fields and bases. Abdomen: distended, no masses or HSM, no localized pain, no rebound or guarding, dull to percussion Extremities: No pedal edema  Laboratory: Recent Labs  Lab 07/21/19 2330 07/22/19 1645 07/23/19 0608  WBC 6.1 7.9 8.7  HGB 8.5* 8.5* 9.1*  HCT 26.3* 25.3* 28.4*  PLT 195 182 202   Recent Labs  Lab 07/21/19 0928  07/21/19 1821 07/21/19 2330 07/23/19 0608  NA 140   < > 135 135 138  K 4.5   < >  4.4 4.2 4.5  CL 108   < > 104 104 106  CO2 23  --  21* 20* 22  BUN 72*   < > 73* 74* 80*  CREATININE 5.55*   < > 5.71* 5.79* 5.99*  CALCIUM 9.0  --  8.6* 8.6* 9.3  PROT 7.7  --   --   --  7.6  BILITOT 0.7  --   --   --  1.2  ALKPHOS 42  --   --   --  43  ALT 51*  --   --   --  34  AST 95*  --   --   --  47*  GLUCOSE 110*   < > 152* 149* 128*   < > = values in this interval not displayed.    Imaging/Diagnostic Tests: Dg Chest 2 View  Result Date: 07/22/2019 CLINICAL DATA:  Respiratory distress.  Cough. EXAM: CHEST - 2 VIEW COMPARISON:  July 21, 2019 FINDINGS: Stable cardiomegaly. The hila and mediastinum are normal. No pneumothorax. Diffuse bilateral pulmonary opacities are  identified, worrisome for edema. More focal opacity in the medial right lung base is stable to mildly more prominent which could represent consolidative edema versus a superimposed infiltrate. IMPRESSION: Pulmonary edema. More focal opacity in the medial right lung base is stable to slightly more prominent. This could be part of the pulmonary edema or a superimposed infection. Electronically Signed   By: Dorise Bullion III M.D   On: 07/22/2019 15:07    Gladys Damme, MD 07/23/2019, 8:20 AM PGY-1, [place] Intern pager: [phone], text pages welcome

## 2019-07-24 ENCOUNTER — Inpatient Hospital Stay (HOSPITAL_COMMUNITY): Payer: Medicare Other

## 2019-07-24 ENCOUNTER — Encounter (HOSPITAL_COMMUNITY): Payer: Self-pay | Admitting: Radiology

## 2019-07-24 DIAGNOSIS — I161 Hypertensive emergency: Secondary | ICD-10-CM

## 2019-07-24 DIAGNOSIS — F1410 Cocaine abuse, uncomplicated: Secondary | ICD-10-CM

## 2019-07-24 DIAGNOSIS — T82898A Other specified complication of vascular prosthetic devices, implants and grafts, initial encounter: Secondary | ICD-10-CM

## 2019-07-24 DIAGNOSIS — I5033 Acute on chronic diastolic (congestive) heart failure: Secondary | ICD-10-CM

## 2019-07-24 DIAGNOSIS — N185 Chronic kidney disease, stage 5: Secondary | ICD-10-CM

## 2019-07-24 HISTORY — PX: IR US GUIDE VASC ACCESS RIGHT: IMG2390

## 2019-07-24 HISTORY — PX: IR FLUORO GUIDE CV LINE RIGHT: IMG2283

## 2019-07-24 LAB — HEPATITIS B CORE ANTIBODY, TOTAL: Hep B Core Total Ab: REACTIVE — AB

## 2019-07-24 LAB — URINALYSIS, ROUTINE W REFLEX MICROSCOPIC
Bilirubin Urine: NEGATIVE
Glucose, UA: NEGATIVE mg/dL
Ketones, ur: NEGATIVE mg/dL
Leukocytes,Ua: NEGATIVE
Nitrite: NEGATIVE
Protein, ur: 100 mg/dL — AB
Specific Gravity, Urine: 1.013 (ref 1.005–1.030)
pH: 5 (ref 5.0–8.0)

## 2019-07-24 LAB — RENAL FUNCTION PANEL
Albumin: 2.9 g/dL — ABNORMAL LOW (ref 3.5–5.0)
Anion gap: 15 (ref 5–15)
BUN: 92 mg/dL — ABNORMAL HIGH (ref 8–23)
CO2: 17 mmol/L — ABNORMAL LOW (ref 22–32)
Calcium: 9.2 mg/dL (ref 8.9–10.3)
Chloride: 104 mmol/L (ref 98–111)
Creatinine, Ser: 5.7 mg/dL — ABNORMAL HIGH (ref 0.61–1.24)
GFR calc Af Amer: 11 mL/min — ABNORMAL LOW (ref >60)
GFR calc non Af Amer: 9 mL/min — ABNORMAL LOW (ref >60)
Glucose, Bld: 147 mg/dL — ABNORMAL HIGH (ref 70–99)
Phosphorus: 3.7 mg/dL (ref 2.5–4.6)
Potassium: 4.6 mmol/L (ref 3.5–5.1)
Sodium: 136 mmol/L (ref 135–145)

## 2019-07-24 LAB — CBC
HCT: 28.1 % — ABNORMAL LOW (ref 39.0–52.0)
Hemoglobin: 9.3 g/dL — ABNORMAL LOW (ref 13.0–17.0)
MCH: 33.3 pg (ref 26.0–34.0)
MCHC: 33.1 g/dL (ref 30.0–36.0)
MCV: 100.7 fL — ABNORMAL HIGH (ref 80.0–100.0)
Platelets: 177 10*3/uL (ref 150–400)
RBC: 2.79 MIL/uL — ABNORMAL LOW (ref 4.22–5.81)
RDW: 15.3 % (ref 11.5–15.5)
WBC: 10.2 10*3/uL (ref 4.0–10.5)
nRBC: 0 % (ref 0.0–0.2)

## 2019-07-24 LAB — GLUCOSE, CAPILLARY
Glucose-Capillary: 168 mg/dL — ABNORMAL HIGH (ref 70–99)
Glucose-Capillary: 156 mg/dL — ABNORMAL HIGH (ref 70–99)

## 2019-07-24 LAB — HEPATITIS B SURFACE ANTIBODY,QUALITATIVE: Hep B S Ab: REACTIVE — AB

## 2019-07-24 LAB — HEPARIN LEVEL (UNFRACTIONATED)
Heparin Unfractionated: 0.24 IU/mL — ABNORMAL LOW (ref 0.30–0.70)
Heparin Unfractionated: 0.13 IU/mL — ABNORMAL LOW (ref 0.30–0.70)

## 2019-07-24 LAB — HEPATITIS B SURFACE ANTIGEN: Hepatitis B Surface Ag: NONREACTIVE

## 2019-07-24 MED ORDER — HEPARIN SODIUM (PORCINE) 1000 UNIT/ML IJ SOLN
INTRAMUSCULAR | Status: AC
  Filled 2019-07-24: qty 3

## 2019-07-24 MED ORDER — HEPARIN SODIUM (PORCINE) 1000 UNIT/ML DIALYSIS
1000.0000 [IU] | INTRAMUSCULAR | Status: DC | PRN
  Filled 2019-07-24: qty 1

## 2019-07-24 MED ORDER — HYDROMORPHONE HCL 1 MG/ML IJ SOLN
1.0000 mg | Freq: Once | INTRAMUSCULAR | Status: AC
  Administered 2019-07-24: 1 mg via INTRAVENOUS

## 2019-07-24 MED ORDER — CARVEDILOL 25 MG PO TABS
25.0000 mg | ORAL_TABLET | Freq: Two times a day (BID) | ORAL | Status: DC
  Administered 2019-07-24 – 2019-08-01 (×14): 25 mg via ORAL
  Filled 2019-07-24 (×15): qty 1

## 2019-07-24 MED ORDER — SODIUM CHLORIDE 0.9 % IV SOLN: 100.0000 mL | INTRAVENOUS | Status: DC | PRN

## 2019-07-24 MED ORDER — PENTAFLUOROPROP-TETRAFLUOROETH EX AERO: 1.0000 "application " | INHALATION_SPRAY | CUTANEOUS | Status: DC | PRN

## 2019-07-24 MED ORDER — HYDROMORPHONE HCL 1 MG/ML IJ SOLN
INTRAMUSCULAR | Status: AC
  Filled 2019-07-24: qty 1

## 2019-07-24 MED ORDER — HEPARIN SODIUM (PORCINE) 1000 UNIT/ML IJ SOLN
INTRAMUSCULAR | Status: AC
  Administered 2019-07-24: 13:00:00 2.6 mL
  Filled 2019-07-24: qty 1

## 2019-07-24 MED ORDER — SODIUM CHLORIDE 0.9% FLUSH: 10.0000 mL | INTRAVENOUS | Status: DC | PRN

## 2019-07-24 MED ORDER — LIDOCAINE HCL 1 % IJ SOLN
INTRAMUSCULAR | Status: AC
  Filled 2019-07-24: qty 20

## 2019-07-24 MED ORDER — HEPARIN SODIUM (PORCINE) 1000 UNIT/ML DIALYSIS
40.0000 [IU]/kg | INTRAMUSCULAR | Status: DC | PRN
  Administered 2019-07-24: 19:00:00 2600 [IU] via INTRAVENOUS_CENTRAL
  Filled 2019-07-24 (×2): qty 4

## 2019-07-24 MED ORDER — ALTEPLASE 2 MG IJ SOLR: 2.0000 mg | Freq: Once | INTRAMUSCULAR | Status: DC | PRN

## 2019-07-24 MED ORDER — LIDOCAINE HCL (PF) 1 % IJ SOLN: 5.0000 mL | INTRAMUSCULAR | Status: DC | PRN

## 2019-07-24 MED ORDER — LIDOCAINE-PRILOCAINE 2.5-2.5 % EX CREA
1.0000 "application " | TOPICAL_CREAM | CUTANEOUS | Status: DC | PRN
  Filled 2019-07-24: qty 5

## 2019-07-24 MED ORDER — LIDOCAINE HCL (PF) 1 % IJ SOLN
INTRAMUSCULAR | Status: AC | PRN
  Administered 2019-07-24: 10 mL

## 2019-07-24 NOTE — Progress Notes (Signed)
Mews now green as bp lower but much lower for pt , pt did receive coreg at 0330 this but paged FM to advise, pt says if they dont do something by 11;30 then he will pull everything off and go : you all are not doing anything for me" I told him he had unfortunately eaten breakfast before the order for IR was placed, I called IR to see when pt may be done for HD cath , staff will talk to md and call back

## 2019-07-24 NOTE — Progress Notes (Signed)
Renal Navigator again attempted to meet with patient to complete referral to OP HD clinic, however patient was having HD catheter placed at this time. Renal Navigator will follow up this afternoon.  Alphonzo Cruise, [place] Renal Navigator [phone]

## 2019-07-24 NOTE — Progress Notes (Signed)
Pt AVF fragile; infiltrated while sticking; unable to advance the needle both times.  Kennieth Francois, RN called and made aware; MD made aware also.

## 2019-07-24 NOTE — Progress Notes (Signed)
[name], male   DOB: [birth year], 68 y.o.   MRN: [id number] [facility] Progress Note   Assessment/ Plan:   1.  Acute exacerbation of diastolic heart failure:  Secondary to recent cocaine use and indiscretions with sodium/fluid intake.  The plan is to commence hemodialysis after having a dialysis catheter placed because of inability to cannulate his left radiocephalic fistula. 2.  Chronic kidney disease stage V-now progression to end-stage renal disease:  With volume overload/shortness of breath requiring escalation of diuretic therapy and now relative resistance.  I have requested for interventional radiology to place a dialysis catheter-at this time appears that this would most favorably be a temporary dialysis catheter with ongoing fever/dyspnea that could be converted to a tunneled catheter at some point.  I have consulted vascular surgery for evaluation of his left radiocephalic fistula for possible salvage versus abandonment and new access placement. 3.  Anemia of chronic kidney disease: Denies overt loss and recent records significant for GI bleeding with AVMs.  Resume IV Fe/ESA. 4.  Secondary hyperparathyroidism: Calcium and phosphorus levels currently acceptable, continue calcitriol for PTH suppression. 5.  HIV infection: Resume/continue antiretroviral therapy. 6.  Polysubstance abuse: Discussed need for cessation  Subjective:   Hemodialysis could not be done last night because of inability to successfully cannulate his left radiocephalic fistula.  He reports to be feeling poorly today with pain over multiple sites, some shortness of breath and fever.   Objective:   BP (!) 155/81 (BP Location: Right Arm)   Pulse (!) 110   Temp (!) 102.8 F (39.3 C) (Oral)   Resp (!) 22   Ht 6\' 2"  (1.88 m)   Wt 88.8 kg   SpO2 94%   BMI 25.14 kg/m   Intake/Output Summary (Last 24 hours) at 07/24/2019 0826 Last data filed at 07/24/2019 0800 Gross per 24 hour  Intake  1634.77 ml  Output 352 ml  Net 1282.77 ml   Weight change: -1.134 kg  Physical Exam: Gen: Uncomfortable resting on his side in bed, nurse at bedside CVS: Pulse regular tachycardia, S1 and S2 normal Resp: Fine rales over both lung fields, no rhonchi Abd: Soft, obese, mildly tender over epigastric area Ext: Trace lower extremity edema, left RCF with palpable thrill  Imaging: Dg Chest 2 View  Result Date: 07/22/2019 CLINICAL DATA:  Respiratory distress.  Cough. EXAM: CHEST - 2 VIEW COMPARISON:  July 21, 2019 FINDINGS: Stable cardiomegaly. The hila and mediastinum are normal. No pneumothorax. Diffuse bilateral pulmonary opacities are identified, worrisome for edema. More focal opacity in the medial right lung base is stable to mildly more prominent which could represent consolidative edema versus a superimposed infiltrate. IMPRESSION: Pulmonary edema. More focal opacity in the medial right lung base is stable to slightly more prominent. This could be part of the pulmonary edema or a superimposed infection. Electronically Signed   By: Dorise Bullion III M.D   On: 07/22/2019 15:07   Dg Abd 1 View  Result Date: 07/23/2019 CLINICAL DATA:  Persistent abdominal pain EXAM: ABDOMEN - 1 VIEW COMPARISON:  CT 11/03/2015 FINDINGS: Vascular calcifications in the pelvis. Prior cholecystectomy. No bowel obstruction, free air, or organomegaly. IMPRESSION: No acute findings. Electronically Signed   By: Rolm Baptise M.D.   On: 07/23/2019 11:17   US Abdomen Complete  Result Date: 07/23/2019 CLINICAL DATA:  Abdominal distension. EXAM: ABDOMEN ULTRASOUND COMPLETE COMPARISON:  CT scan of November 03, 2015. FINDINGS: Gallbladder: Status post cholecystectomy. Common bile duct: Diameter: Not visualized,  but no definite dilatation is noted. Liver: No focal lesion identified. Increased echogenicity of hepatic parenchyma is noted. Portal vein is patent on color Doppler imaging with normal direction of blood flow  towards the liver. IVC: No abnormality visualized. Pancreas: Not visualized due to overlying bowel gas. Spleen: Size and appearance within normal limits. Right Kidney: Length: 12.1 cm. 1 cm cyst is noted in lower pole. Increased echogenicity of renal parenchyma is noted. No mass or hydronephrosis visualized. Left Kidney: Length: 12.9 cm. 8 mm cyst is noted in midpole. Increased echogenicity of renal parenchyma is noted. No mass or hydronephrosis visualized. Abdominal aorta: No aneurysm visualized. Other findings: None. IMPRESSION: Increased echogenicity of renal parenchyma is noted suggesting hepatic steatosis. Increased echogenicity of renal parenchyma is noted bilaterally suggesting medical renal disease. No hydronephrosis or renal obstruction is noted. Small bilateral simple renal cysts are noted. Pancreas not visualized due to overlying bowel gas. Status post cholecystectomy. Electronically Signed   By: Marijo Conception M.D.   On: 07/23/2019 15:24    Labs: BMET Recent Labs  Lab 07/21/19 0928 07/21/19 0943 07/21/19 1821 07/21/19 2330 07/23/19 0608 07/24/19 0241  NA 140 141 135 135 138 136  K 4.5 4.6 4.4 4.2 4.5 4.6  CL 108 109 104 104 106 104  CO2 23  --  21* 20* 22 17*  GLUCOSE 110* 100* 152* 149* 128* 147*  BUN 72* 68* 73* 74* 80* 92*  CREATININE 5.55* 5.60* 5.71* 5.79* 5.99* 5.70*  CALCIUM 9.0  --  8.6* 8.6* 9.3 9.2  PHOS 5.1*  --  4.5 4.5  --  3.7   CBC Recent Labs  Lab 07/21/19 0928  07/21/19 2330 07/22/19 1645 07/23/19 0608 07/24/19 0241  WBC 7.2  --  6.1 7.9 8.7 10.2  NEUTROABS 5.6  --   --   --   --   --   HGB 9.7*   < > 8.5* 8.5* 9.1* 9.3*  HCT 28.9*   < > 26.3* 25.3* 28.4* 28.1*  MCV 100.3*  --  100.8* 99.2 100.7* 100.7*  PLT 200  --  195 182 202 177   < > = values in this interval not displayed.   Medications:    . allopurinol  100 mg Oral Daily  . aspirin EC  81 mg Oral Daily  . atorvastatin  80 mg Oral Daily  . bictegravir-emtricitabine-tenofovir AF  1  tablet Oral QPM  . carvedilol  25 mg Oral BID  . Chlorhexidine Gluconate Cloth  6 each Topical Q0600  . doxycycline  100 mg Oral Q12H  . gabapentin  300 mg Oral QHS  . hydrALAZINE  25 mg Oral TID  . insulin aspart  0-9 Units Subcutaneous TID WC  . isosorbide mononitrate  30 mg Oral Daily  . umeclidinium-vilanterol  1 puff Inhalation Daily   Elmarie Shiley, MD 07/24/2019, 8:26 AM

## 2019-07-24 NOTE — Progress Notes (Signed)
Pt to IR for HD cath.

## 2019-07-24 NOTE — Progress Notes (Addendum)
Progress Note  Patient Name: Jacob [name] Date of Encounter: 07/24/2019  Primary Cardiologist: No primary care provider on file.   Subjective   Very frustrated this morning. Unable to get HD yesterday 2/2 to problem with his AVF.   Inpatient Medications    Scheduled Meds:  allopurinol  100 mg Oral Daily   aspirin EC  81 mg Oral Daily   atorvastatin  80 mg Oral Daily   bictegravir-emtricitabine-tenofovir AF  1 tablet Oral QPM   carvedilol  25 mg Oral BID   Chlorhexidine Gluconate Cloth  6 each Topical Q0600   doxycycline  100 mg Oral Q12H   gabapentin  300 mg Oral QHS   hydrALAZINE  25 mg Oral TID   insulin aspart  0-9 Units Subcutaneous TID WC   isosorbide mononitrate  30 mg Oral Daily   umeclidinium-vilanterol  1 puff Inhalation Daily   Continuous Infusions:  sodium chloride 1,000 mL (07/22/19 2100)   cefTRIAXone (ROCEPHIN)  IV 2 g (07/24/19 0340)   heparin 2,200 Units/hr (07/24/19 0354)   PRN Meds: sodium chloride, acetaminophen **OR** acetaminophen, guaiFENesin, simethicone   Vital Signs    Vitals:   07/24/19 0848 07/24/19 0853 07/24/19 0854 07/24/19 0900  BP: (!) 152/73 (!) 111/52 (!) 106/57 (!) 116/54  Pulse: (!) 105 (!) 105 (!) 105 98  Resp: (!) 22     Temp: (!) 100.9 F (38.3 C)     TempSrc: Oral     SpO2: 94%     Weight:      Height:        Intake/Output Summary (Last 24 hours) at 07/24/2019 0959 Last data filed at 07/24/2019 0800 Gross per 24 hour  Intake 1394.77 ml  Output 252 ml  Net 1142.77 ml   Last 3 Weights 07/24/2019 07/23/2019 07/22/2019  Weight (lbs) 195 lb 12.8 oz 198 lb 4.8 oz 199 lb 3.2 oz  Weight (kg) 88.814 kg 89.948 kg 90.357 kg      Telemetry    ST - Personally Reviewed  ECG    No new tracing this morning - Personally Reviewed  Physical Exam  Older AAM, laying bed.  GEN: Notable short of breath while at rest.  Neck: + JVD Cardiac: Tachycardic, no murmurs, rubs, or gallops.  Respiratory: Diminished  bilaterally. GI: Soft, nontender, non-distended  MS: No edema; No deformity. Neuro:  Nonfocal  Psych: Normal affect   Labs    High Sensitivity Troponin:   Recent Labs  Lab 07/21/19 0928 07/21/19 1307 07/21/19 1943 07/21/19 2330  TROPONINIHS 284* 585* 899* 864*      Chemistry Recent Labs  Lab 07/21/19 0928  07/21/19 2330 07/23/19 0608 07/24/19 0241  NA 140   < > 135 138 136  K 4.5   < > 4.2 4.5 4.6  CL 108   < > 104 106 104  CO2 23   < > 20* 22 17*  GLUCOSE 110*   < > 149* 128* 147*  BUN 72*   < > 74* 80* 92*  CREATININE 5.55*   < > 5.79* 5.99* 5.70*  CALCIUM 9.0   < > 8.6* 9.3 9.2  PROT 7.7  --   --  7.6  --   ALBUMIN 3.2*   < > 2.9* 3.0* 2.9*  AST 95*  --   --  47*  --   ALT 51*  --   --  34  --   ALKPHOS 42  --   --  43  --  BILITOT 0.7  --   --  1.2  --   GFRNONAA 10*   < > 9* 9* 9*  GFRAA 11*   < > 11* 10* 11*  ANIONGAP 9   < > 11 10 15    < > = values in this interval not displayed.     Hematology Recent Labs  Lab 07/22/19 1645 07/23/19 0608 07/24/19 0241  WBC 7.9 8.7 10.2  RBC 2.55* 2.82* 2.79*  HGB 8.5* 9.1* 9.3*  HCT 25.3* 28.4* 28.1*  MCV 99.2 100.7* 100.7*  MCH 33.3 32.3 33.3  MCHC 33.6 32.0 33.1  RDW 15.2 15.0 15.3  PLT 182 202 177    BNP Recent Labs  Lab 07/21/19 0928  BNP 1,535.2*     DDimer No results for input(s): DDIMER in the last 168 hours.   Radiology    Dg Chest 2 View  Result Date: 07/22/2019 CLINICAL DATA:  Respiratory distress.  Cough. EXAM: CHEST - 2 VIEW COMPARISON:  July 21, 2019 FINDINGS: Stable cardiomegaly. The hila and mediastinum are normal. No pneumothorax. Diffuse bilateral pulmonary opacities are identified, worrisome for edema. More focal opacity in the medial right lung base is stable to mildly more prominent which could represent consolidative edema versus a superimposed infiltrate. IMPRESSION: Pulmonary edema. More focal opacity in the medial right lung base is stable to slightly more prominent. This  could be part of the pulmonary edema or a superimposed infection. Electronically Signed   By: Dorise Bullion III M.D   On: 07/22/2019 15:07   Dg Abd 1 View  Result Date: 07/23/2019 CLINICAL DATA:  Persistent abdominal pain EXAM: ABDOMEN - 1 VIEW COMPARISON:  CT 11/03/2015 FINDINGS: Vascular calcifications in the pelvis. Prior cholecystectomy. No bowel obstruction, free air, or organomegaly. IMPRESSION: No acute findings. Electronically Signed   By: Rolm Baptise M.D.   On: 07/23/2019 11:17   US Abdomen Complete  Result Date: 07/23/2019 CLINICAL DATA:  Abdominal distension. EXAM: ABDOMEN ULTRASOUND COMPLETE COMPARISON:  CT scan of November 03, 2015. FINDINGS: Gallbladder: Status post cholecystectomy. Common bile duct: Diameter: Not visualized, but no definite dilatation is noted. Liver: No focal lesion identified. Increased echogenicity of hepatic parenchyma is noted. Portal vein is patent on color Doppler imaging with normal direction of blood flow towards the liver. IVC: No abnormality visualized. Pancreas: Not visualized due to overlying bowel gas. Spleen: Size and appearance within normal limits. Right Kidney: Length: 12.1 cm. 1 cm cyst is noted in lower pole. Increased echogenicity of renal parenchyma is noted. No mass or hydronephrosis visualized. Left Kidney: Length: 12.9 cm. 8 mm cyst is noted in midpole. Increased echogenicity of renal parenchyma is noted. No mass or hydronephrosis visualized. Abdominal aorta: No aneurysm visualized. Other findings: None. IMPRESSION: Increased echogenicity of renal parenchyma is noted suggesting hepatic steatosis. Increased echogenicity of renal parenchyma is noted bilaterally suggesting medical renal disease. No hydronephrosis or renal obstruction is noted. Small bilateral simple renal cysts are noted. Pancreas not visualized due to overlying bowel gas. Status post cholecystectomy. Electronically Signed   By: Marijo Conception M.D.   On: 07/23/2019 15:24    Cardiac  Studies   TTE: 02/07/19  IMPRESSIONS   1. The left ventricle has mild-moderately reduced systolic function, with an ejection fraction of 40-45%. The cavity size was normal. Mild basal septal hypertrophy. Left ventricular diastolic Doppler parameters are consistent with pseudonormalization.  Elevated left ventricular end-diastolic pressure Left ventricular diffuse hypokinesis. 2. The right ventricle has normal systolic function. The cavity was normal.  There is no increase in right ventricular wall thickness. 3. Left atrial size was severely dilated. 4. There was trivial MR, TR and PR.  Patient Profile     68 y.o. male with a hx of CAD s/p CABG 2005, cardiomyopathy LV EF 30% in 2015 now 40-45% (SPECT 2016 w/scar and no ischemia),CKD with Cr 5-6,HIV,HBV, GI bleed in 01/2019 requiring transfusion,cocaine use, HTN, HLD who presents with shortness of breathand chest tightness.   Assessment & Plan    1. CAD s/p CABG/NSTEMI: HsT peaked at 899. Discussions around cardiac cath given symptoms of unstable angina and mildly elevated troponin. This has been postponed due to his worsening renal function. He was planned to start HD yesterday but issues with his fistula, needs temp HDC. Now with fever, and generalized abd pain. Cath deferred for now.   2. Ischemic cardiomyopathy/Acute on chronic systolic and diastolic CHF: Volume removal with HD. Received coreg this morning. Will reschedule dose for later this evening given plans for HD today.   3.  CKD stage V: Hopefully will receive temp [place] today for HD.   4. Polysubstance abuse: recent use of cocaine over the weekend. Cessation advised.   5. Fever/abd pain: temp max 102.8. BC with no growth to date. Abd [place] and xray negative yesterday. Still with ongoing generalized abd pain. Reports several episodes of diarrhea. Further management per primary.   6. CAP?: receiving antibiotics per primary.   For questions or updates, please contact [place] Please consult [email or web address] for contact info under     Signed, Reino Bellis, NP  07/24/2019, 9:59 AM    Available data reviewed. Agree with findings, assessment, and plan as outlined by Reino Bellis, NP. Came to see patient but he is currently in Interventional Radiology undergoing temporary dialysis catheter placement. Cardiology evaluation on hold as he is initiating HD. Plans for cardiac catheterization once he is tolerating HD with improvement in his volume status. Will follow-up tomorrow.   Sherren Mocha, M.D. 07/24/2019 1:09 PM

## 2019-07-24 NOTE — Progress Notes (Signed)
[place] for Heparin Indication: chest pain/ACS  Heparin Dosing Weight: 91 kg  Labs: Recent Labs    07/21/19 0928  07/21/19 1307  07/21/19 1943 07/21/19 2330  07/22/19 1645 07/23/19 0608 07/23/19 1618 07/24/19 0241  HGB 9.7*   < >  --   --   --  8.5*  --  8.5* 9.1*  --  9.3*  HCT 28.9*   < >  --   --   --  26.3*  --  25.3* 28.4*  --  28.1*  PLT 200  --   --   --   --  195  --  182 202  --  177  LABPROT 13.8  --   --   --   --   --   --   --   --   --   --   INR 1.1  --   --   --   --   --   --   --   --   --   --   HEPARINUNFRC  --   --   --    < >  --   --    < > <0.10* 0.27* 0.31 0.24*  CREATININE 5.55*   < >  --    < >  --  5.79*  --   --  5.99*  --  5.70*  TROPONINIHS 284*  --  585*  --  899* 864*  --   --   --   --   --    < > = values in this interval not displayed.    Estimated Creatinine Clearance: 14 mL/min (A) (by C-G formula based on SCr of 5.7 mg/dL (H)).  Assessment: 21 yom presenting with SOB, elevated troponin. Pharmacy consulted to dose heparin for ACS. Pt is not on anticoagulation PTA. Hg 10.5, plt wnl. Noted hx of GIB documented in April 2020, but no current active bleed issues documented.  9/30 AM update:  Heparin level sub-therapeutic  Goal of Therapy:  Heparin level 0.3-0.7 units/ml Monitor platelets by anticoagulation protocol: Yes   Plan:  Increase heparin to 2200 units/hr Recheck heparin level at [place], PharmD, [place] Pharmacist Phone: [phone]

## 2019-07-24 NOTE — Progress Notes (Addendum)
Renal Navigator attempted to meet with patient, but he had urinated on the floor, so Renal Navigator spoke with staff at desk to ask for assistance and will attempt to meet with patient again at a later time.  Alphonzo Cruise, [place] Renal Navigator  [phone]

## 2019-07-24 NOTE — Procedures (Signed)
Interventional Radiology Procedure Note  Procedure: Temp dialysis catheter placement via right IJ  Complications: None immediate  Estimated Blood Loss: <10 mL  Recommendations: Catheter ready for immediate use as clinically indicated.   Signed,  Constance Holster, MD

## 2019-07-24 NOTE — Progress Notes (Signed)
Pt returned from IR, pt voided on floor " I couldn't reach the urinal" pt cleansed and bed and gown changed, pt said he had sample in IR, I advised they may not have known about sample, educated to use urinal next time for urine sample. Called HD and advised HD cath placed.

## 2019-07-24 NOTE — Care Management Important Message (Signed)
Important Message  Patient Details  Name: [name] MRN: [id number] Date of Birth: 1951-03-31   Medicare Important Message Given:  Yes     Tabathia Knoche 07/24/2019, 3:01 PM

## 2019-07-24 NOTE — Progress Notes (Signed)
ANTICOAGULATION CONSULT NOTE  Pharmacy Consult for Heparin Indication: chest pain/ACS  Heparin Dosing Weight: 91 kg  Labs: Recent Labs    07/21/19 1943  07/21/19 2330  07/22/19 1645 07/23/19 0608 07/23/19 1618 07/24/19 0241 07/24/19 1506  HGB  --    < > 8.5*  --  8.5* 9.1*  --  9.3*  --   HCT  --    < > 26.3*  --  25.3* 28.4*  --  28.1*  --   PLT  --    < > 195  --  182 202  --  177  --   HEPARINUNFRC  --   --   --    < > <0.10* 0.27* 0.31 0.24* 0.13*  CREATININE  --   --  5.79*  --   --  5.99*  --  5.70*  --   TROPONINIHS 899*  --  864*  --   --   --   --   --   --    < > = values in this interval not displayed.    Estimated Creatinine Clearance: 14 mL/min (A) (by C-G formula based on SCr of 5.7 mg/dL (H)).  Assessment: 11 yom presenting with SOB, elevated troponin. Pharmacy consulted to dose heparin for ACS. Pt is not on anticoagulation PTA. Hg 10.5, plt wnl. Noted hx of GIB documented in April 2020, but no current active bleed issues documented.  HL subtherapeutic at 0.13 Patient in IR for procedure, however told that pump was running.  RN states line that line was kinked on returnb to the floor. HL barely therapeutic prior to procedure. Will increase drip.   Goal of Therapy:  Heparin level 0.3-0.7 units/ml Monitor platelets by anticoagulation protocol: Yes   Plan:  Increase heparin to 2400 units/hr Recheck heparin level at [place]. Levada Dy, PharmD, BCPS, FNKF Clinical Pharmacist  Please utilize Amion for appropriate phone number to reach the unit pharmacist ([place])

## 2019-07-24 NOTE — Progress Notes (Signed)
Pt MEWS is 4, pt has temp 102.8, pt anxious and generalized ache, pt now NPO awaiting HD cath by IR for HD today, ST on tele which as been ongoing, paged FM to advise of temp, tachypnea and st have been on going

## 2019-07-24 NOTE — Progress Notes (Signed)
Pt has temp 102.8, paged FM to advise, will give tylenol, pt also has order for IR, pt ate grits and piece of bacon before I could tell him to be Npo, IR called and informed, pt aware of plan to do IR then HD today

## 2019-07-24 NOTE — Progress Notes (Signed)
Pt refused to go to HD, " he says he is tired and does't feel well, I advised it was important to fluid off and he really  Needs to go " you cant make me go  And I advised him it was true but I really needed to educate and encourage. I called and spoke to [place] in HD and advised of pt refusal, she would let Dr Posey Pronto know. Dr Posey Pronto came to floor to talk to pt and has agreed to go

## 2019-07-24 NOTE — Progress Notes (Addendum)
Family Medicine Teaching Service Daily Progress Note Intern Pager: [phone]  Patient name: Jacob [name] Medical record number: [id number] Date of birth: 01/09/1951 Age: 68 y.o. Gender: male  Primary Care Provider: [name], NP Consultants: Cardiology, Nephrology Code Status: Full  Pt Overview and Major Events to Date:  9/27- admitted, NSTEMI 9/29-unable to cannulate AV fistula 9/30-plan to go to IR for temporary catheter for HD  Assessment and Plan: Jacob [name] is a 68 y.o. male presenting with dyspnea, chest pain. PMH is significant for CHF, CAD, cardiomyopathy, HIV, DMT2, substance use disorder.  NSTEMI  Atypical Chest pain, resolved  H/o CAD s/p CABG 2004 Patient denies chest pain today.  Overnight patient had AV fistula infiltrate, to IR today to get temporary catheter for HD.  Will follow up with cardiology re: plans for angiogram s/p HD.  Patient on heparin drip. Home meds include high intensity statin. - Admit to FPTS, cardiac telemetry, attending Dr. Ardelia Mems - continue home meds - cards consulted, appreciate recs - nephrology consulted, appreciate recommendations -IR consulted, appreciate recommendations - continue heparin drip  Acute Hypoxic Respiratory Failure likely 2/2 CHF exacerbation  HFrEF (EF 40-45%): Patient appears much worse today on exam, he had a fever yesterday, and repeat fever today to 102.8 F.  Concern for CAP yesterday due to new cough, chest x-ray with edema versus infiltrates, patient was started on ceftriaxone and doxycycline (we did not use azithromycin due to patient's prolonged QT of 533 on 9/28).  Pulmonary exam reveals crackles at the bilateral mid lung fields and bases, with decreased aeration in the same areas.  He continues to be tachypneic, extremely uncomfortable, requiring 3 L nasal cannula and satting mid 90s.  He does not have a home oxygen requirement.  Urine output overnight was  0.2L, he did not receive Lasix yesterday as  he has resistance to it.  KUB negative for SBO, abdominal ultrasound negative for ascites and free fluid, positive for echogenic liver-most likely hepatic steatosis.  Home meds are Coreg 25 mg BID, Lasix 160 mg TID, Imdur 30 mg, nitroglycerin. -To IR for temporary catheter for HD today - strict I/O's, daily weights - Continue heparin gtt  - Continuous pulse ox - O2 supplementation, maintain oxygenation >88% via  - AM CBC, CMP  - hold home meds  Concern for CAP Patient with new onset cough since yesterday, T-max of 102.8 F today.  Worsening tachypnea today and oxygen requirement of 3 L (does not have oxygen at home).  CXR reveals possible new infiltrate versus pulmonary edema. Patient does have known HFrEF of 40 to 45% with recent NSTEMI, however due to fevers we will cover for CAP.  For HD later today. -start coverage for CAP, will discuss on rounds which abx best for recent NSTEMI -monitor fever curve, sx  -For HD later today after IR places temporary catheter  ESRD not on HD Unable to obtain HD last night as they could not cannulate fistula.  Patient to go to IR today for temporary catheter to receive HD.  VVS to perform doppler of AV fistula to assess long-term access. CKD Stage V since 02/2018.  AV fistula placed in October 2019 by Dr. Monica Martinez, VVS. Follows with Dr. [name], Nephrology, for anemia of chronic disease.  - monitor with daily BMP - nephology consult -For HD today after IR placement of temporary catheter  Anemia of Chronic Disease  H/O GI Bleed (01/2019)- stable Hemoglobin stable this morning at 9.3, 9.7 on admission. MCV elevated  to 100.3.  No signs of bleeding on exam.  Patient hospitalized for acute GI bleed in April 2020.  - follow with daily CBC - will monitor for signs of bleeding while on heparin gtt  Polysubstance Use Disorder Last reported intermittent cocaine use, last used Saturday. Smokes 1PPD x >45 years. Drinks ~6-32oz beers on weekends. Last drink  9/26. UDS reveals positive for opiates as well, no history of opiates given this admission or by EMS. - Nicotine patch PRN - encourage cessation of drug use  Elevated LFTs-resolved Patient reported that he drank six 32 ounce beers over the weekend, clear due to alcohol intake. -stable  T2DM: Home meds include: Toujeo 88U QD and Humalog 20U TID. Overnight BGs in 140-202, patient received total of 9U of short acting insulin. Sensitive SSI appropriate for now, will continue to monitor. - continue sensitive SSI - BG qAC, qHS, qAM fasting  COPD Has 45-pack-year smoking history.  He smokes a pack per day.  Home medication is Incruse Ellipta. -Continue home medications  HTN Blood pressures elevated on admission: 170-190's/90's.  Overnight BP range from 155-194/81 209.  This morning blood pressure 155/81.  Home medications include hydralazine 3 times daily, Coreg.  -Continue hydralazine and coreg  HIV Home med is Biktarvy. HIV-1 RNA quantification was not detectable on 06/24/2019. He follows with Dr. [name] at [facility]. Last appt 07/08/2019. Good control. -Continue home meds  Gout Home meds include colchicine and allopurinol. - hold colchicine - continue allopurinol  HLD Home meds are high intensity statin. -Continue atorvastatin 80 mg  FEN/GI: heart healthy/carb modified/renal diet PPx: heparin gtt  Disposition: to cardiac telemetry pending further work up and stabilization  Subjective:  Patient states that he feels terrible today, much like yesterday.  He is having trouble breathing with crackles on exam, he also has states that he has pain in his abdomen from the distention, thought likely to be fluid due to overall fluid overload and HFrEF.  Expect that patient will feel much improved after dialysis.  Objective: Temp:  [98.3 F (36.8 C)-102.8 F (39.3 C)] 100.9 F (38.3 C) (09/30 0848) Pulse Rate:  [98-126] 98 (09/30 0900) Resp:  [18-22] 22 (09/30 0848) BP:  (106-194)/(52-109) 116/54 (09/30 0900) SpO2:  [90 %-98 %] 94 % (09/30 0848) Weight:  [88.8 kg] 88.8 kg (09/30 0332) Physical Exam: General: Pleasant, older gentleman lying in bed, ill-appearing, NAD Cardiovascular: Regular rate and rhythm, no M/R/G Respiratory: tachypneic, Diminished aeration as well as crackles in the bilateral mid lung fields and bases. Abdomen: distended, no masses or HSM, no localized pain, no rebound or guarding, dull to percussion Extremities: No pedal edema  Laboratory: Recent Labs  Lab 07/22/19 1645 07/23/19 0608 07/24/19 0241  WBC 7.9 8.7 10.2  HGB 8.5* 9.1* 9.3*  HCT 25.3* 28.4* 28.1*  PLT 182 202 177   Recent Labs  Lab 07/21/19 0928  07/21/19 2330 07/23/19 0608 07/24/19 0241  NA 140   < > 135 138 136  K 4.5   < > 4.2 4.5 4.6  CL 108   < > 104 106 104  CO2 23   < > 20* 22 17*  BUN 72*   < > 74* 80* 92*  CREATININE 5.55*   < > 5.79* 5.99* 5.70*  CALCIUM 9.0   < > 8.6* 9.3 9.2  PROT 7.7  --   --  7.6  --   BILITOT 0.7  --   --  1.2  --   ALKPHOS 42  --   --  43  --   ALT 51*  --   --  34  --   AST 95*  --   --  47*  --   GLUCOSE 110*   < > 149* 128* 147*   < > = values in this interval not displayed.    Imaging/Diagnostic Tests: Dg Abd 1 View  Result Date: 07/23/2019 CLINICAL DATA:  Persistent abdominal pain EXAM: ABDOMEN - 1 VIEW COMPARISON:  CT 11/03/2015 FINDINGS: Vascular calcifications in the pelvis. Prior cholecystectomy. No bowel obstruction, free air, or organomegaly. IMPRESSION: No acute findings. Electronically Signed   By: Rolm Baptise M.D.   On: 07/23/2019 11:17   US Abdomen Complete  Result Date: 07/23/2019 CLINICAL DATA:  Abdominal distension. EXAM: ABDOMEN ULTRASOUND COMPLETE COMPARISON:  CT scan of November 03, 2015. FINDINGS: Gallbladder: Status post cholecystectomy. Common bile duct: Diameter: Not visualized, but no definite dilatation is noted. Liver: No focal lesion identified. Increased echogenicity of hepatic parenchyma  is noted. Portal vein is patent on color Doppler imaging with normal direction of blood flow towards the liver. IVC: No abnormality visualized. Pancreas: Not visualized due to overlying bowel gas. Spleen: Size and appearance within normal limits. Right Kidney: Length: 12.1 cm. 1 cm cyst is noted in lower pole. Increased echogenicity of renal parenchyma is noted. No mass or hydronephrosis visualized. Left Kidney: Length: 12.9 cm. 8 mm cyst is noted in midpole. Increased echogenicity of renal parenchyma is noted. No mass or hydronephrosis visualized. Abdominal aorta: No aneurysm visualized. Other findings: None. IMPRESSION: Increased echogenicity of renal parenchyma is noted suggesting hepatic steatosis. Increased echogenicity of renal parenchyma is noted bilaterally suggesting medical renal disease. No hydronephrosis or renal obstruction is noted. Small bilateral simple renal cysts are noted. Pancreas not visualized due to overlying bowel gas. Status post cholecystectomy. Electronically Signed   By: Marijo Conception M.D.   On: 07/23/2019 15:24    Gladys Damme, MD 07/24/2019, 9:53 AM PGY-1, [place] Intern pager: [phone], text pages welcome

## 2019-07-24 NOTE — Progress Notes (Signed)
Pt says IR told him he can have blood drawn by nurse. I did consult for IV team for hep level, she says there needs to be order for IV team to draw lab from it. I paged Dr Posey Pronto order and received

## 2019-07-24 NOTE — Consult Note (Addendum)
Hospital Consult    Reason for Consult: L arm radiocephalic fistula Requesting Physician:  Dr. Posey Pronto MRN #:  [id number]  History of Present Illness: This is a 68 y.o. male with past medical history significant for CHF, CKD with progression to end-stage renal disease requiring initiation of hemodialysis during this admission.  He is being seen in consultation for malfunctioning left arm radiocephalic fistula.  He is status post creation of left radiocephalic fistula by Dr. Carlis Abbott in October 2019.  This fistula was slow to mature and thus he underwent ligation of competing branches by Dr. Carlis Abbott on 11/05/2018.  Based on duplex in the office after surgery fistula diameter did improve after branch ligation.  They attempted to cannulate fistula and dialysis last night however they were unable to cannulate fistula.  He will have [place] placed by interventional radiology today.  Past Medical History:  Diagnosis Date  . Acute respiratory failure ([place]) 03/01/2018  . Arthritis    "all over; mostly knees and back" (02/28/2018)  . CHF (congestive heart failure) (HCC)    not on any meds  . Chronic lower back pain    stenosis  . CKD (chronic kidney disease), stage V ([place])    [name] 02/28/2018  . Community acquired pneumonia 09/06/2013  . Coronary atherosclerosis of native coronary artery 2005   s/p surgery  . Drug abuse ([place])    hx; tested for cocaine as recently as 2/08. says he is not using drugs now - avoided defib. for this reason   . GERD (gastroesophageal reflux disease)    takes OTC meds as needed  . GI bleeding 02/06/2019  . Glaucoma    uses eye drops daily  . Hepatitis B 1968   "tx'd w/isolation; caught it from toilet stools in gym"  . History of blood transfusion 03/01/2019  . History of colon polyps    benign  . History of gout    takes Allopurinol daily as well as Colchicine-if needed (02/28/2018)  . History of kidney stones   . HTN (hypertension)    takes Coreg,Imdur.and Apresoline daily   . Human immunodeficiency virus (HIV) disease ([place]) dx'd 1995   takes Genvoya daily  . Hyperlipidemia    takes Atorvastatin daily  . Ischemic cardiomyopathy   . Muscle spasm    takes Zanaflex as needed  . Myocardial infarction ([place]) ~ 2004/2005  . Nocturia   . Peripheral neuropathy    takes gabapentin daily  . Pneumonia    "at least twice" (02/28/2018)  . Shortness of breath dyspnea    rarely but if notices it then with exertion  . Syphilis, unspecified   . Type II diabetes mellitus ([place]) 2004   Lantus daily.Average fasting blood sugar 125-199  . Wears glasses   . Wears partial dentures     Past Surgical History:  Procedure Laterality Date  . AV FISTULA PLACEMENT Left 08/02/2018   Procedure: ARTERIOVENOUS (AV) FISTULA CREATION  left arm radiocephlic;  Surgeon: Marty Heck, MD;  Location: [facility];  Service: Vascular;  Laterality: Left;  . BACK SURGERY    . CARDIAC CATHETERIZATION  10/2002; 12/19/2004   [name] 03/08/2011  . COLONOSCOPY  2013   Dr.John Henrene Pastor   . CORONARY ARTERY BYPASS GRAFT  02/24/2003   CABG X2/notes 03/08/2011  . ESOPHAGOGASTRODUODENOSCOPY (EGD) WITH PROPOFOL N/A 02/08/2019   Procedure: ESOPHAGOGASTRODUODENOSCOPY (EGD) WITH PROPOFOL;  Surgeon: Milus Banister, MD;  Location: [facility];  Service: Gastroenterology;  Laterality: N/A;  . FRACTURE SURGERY    .  HOT HEMOSTASIS N/A 02/08/2019   Procedure: HOT HEMOSTASIS (ARGON PLASMA COAGULATION/BICAP);  Surgeon: Milus Banister, MD;  [facility] ENDOSCOPY;  Service: Gastroenterology;  Laterality: N/A;  . INTERTROCHANTERIC HIP FRACTURE SURGERY Left 11/2006   [name] 03/08/2011  . LAPAROSCOPIC CHOLECYSTECTOMY  05/2006  . LIGATION OF COMPETING BRANCHES OF ARTERIOVENOUS FISTULA Left 11/05/2018   Procedure: LIGATION OF COMPETING BRANCHES OF ARTERIOVENOUS FISTULA  LEFT  ARM;  Surgeon: Marty Heck, MD;  Location: [facility];  Service: Vascular;  Laterality: Left;  . LUMBAR LAMINECTOMY/DECOMPRESSION MICRODISCECTOMY  N/A 02/29/2016   Procedure: Left L4-5 Lateral Recess Decompression, Removal Extradural Intraspinal Facet Cyst;  Surgeon: Marybelle Killings, MD;  Location: [facility];  Service: Orthopedics;  Laterality: N/A;  . MULTIPLE TOOTH EXTRACTIONS    . ORIF MANDIBULAR FRACTURE Left 08/13/2004   ORIF of left body fracture mandible with KLS Martin 2.3-mm six hole/notes 03/08/2011    Allergies  Allergen Reactions  . Augmentin [Amoxicillin-Pot Clavulanate] Diarrhea    Severe diarrhea   . Amphetamines Other (See Comments)    Unknown reaction type    Prior to Admission medications   Medication Sig Start Date End Date Taking? Authorizing Provider  acetaminophen (TYLENOL 8 HOUR) 650 MG CR tablet Take 650 mg by mouth every 8 (eight) hours as needed for pain.   Yes [provider]  allopurinol (ZYLOPRIM) 100 MG tablet TAKE 1 TABLET(100 MG) BY MOUTH DAILY Patient taking differently: Take 100 mg by mouth daily.  07/15/19  Yes [name], NP  atorvastatin (LIPITOR) 80 MG tablet Take 1 tablet (80 mg total) by mouth daily. 12/05/18  Yes [name], NP  BIKTARVY 50-200-25 MG TABS tablet TAKE 1 TABLET BY MOUTH DAILY 07/12/19  Yes [name], MD  calcitRIOL (ROCALTROL) 0.25 MCG capsule Take 1 capsule by mouth daily. 06/07/19  Yes [provider]  carvedilol (COREG) 25 MG tablet Take 1 tablet (25 mg total) by mouth 2 (two) times daily with a meal. 04/16/19  Yes [name], MD  Cholecalciferol (VITAMIN D3) 50 MCG (2000 UT) TABS Take 2,000 Units by mouth daily.   Yes [provider]  Choline Fenofibrate (FENOFIBRIC ACID) 135 MG CPDR TAKE 1 CAPSULE BY MOUTH DAILY Patient taking differently: Take 135 mg by mouth daily.  01/21/19  Yes [name], NP  colchicine 0.6 MG tablet Take one tablet by mouth as needed for gout Patient taking differently: Take 0.6 mg by mouth as needed (gout flare up).  04/30/19  Yes [name], NP  diclofenac sodium (VOLTAREN) 1 % GEL Apply 1  application topically 4 (four) times daily as needed for pain. 07/19/19  Yes [provider]  furosemide (LASIX) 80 MG tablet Take 160 mg by mouth 3 (three) times daily.    Yes [provider]  gabapentin (NEURONTIN) 300 MG capsule TAKE 1 CAPSULE BY MOUTH THREE TIMES DAILY Patient taking differently: Take 300 mg by mouth 3 (three) times daily.  07/15/19  Yes [name], NP  Glucosamine-Chondroit-Vit C-Mn (GLUCOSAMINE CHONDR 1500 COMPLX PO) Take 2 tablets by mouth daily.    Yes [provider]  hydrALAZINE (APRESOLINE) 25 MG tablet TAKE 1 TABLET(25 MG) BY MOUTH THREE TIMES DAILY Patient taking differently: Take 25 mg by mouth 3 (three) times daily.  04/10/19  Yes [name], NP  Insulin Glargine, 2 Unit Dial, (TOUJEO MAX SOLOSTAR) 300 UNIT/ML SOPN Inject 88 Units into the skin daily. 06/28/19  Yes [name], NP  insulin lispro (  HUMALOG) 100 UNIT/ML KwikPen Inject 0.2 mLs (20 Units total) into the skin 3 (three) times daily. 06/04/19  Yes [name], NP  isosorbide mononitrate (IMDUR) 30 MG 24 hr tablet TAKE 1 TABLET(30 MG) BY MOUTH DAILY Patient taking differently: Take 30 mg by mouth daily.  04/15/19  Yes [name], NP  latanoprost (XALATAN) 0.005 % ophthalmic solution Place 1 drop into both eyes at bedtime. Reported on 04/04/2016 04/23/12  Yes [provider]  nitroGLYCERIN (NITROSTAT) 0.3 MG SL tablet ONE TABLET UNDER TONGUE AS NEEDED FOR CHEST PAIN Patient taking differently: Place 0.3 mg under the tongue every 5 (five) minutes as needed for chest pain.  05/01/19  Yes [name], MD  sodium bicarbonate 650 MG tablet Take 650 mg by mouth 2 (two) times daily.  05/21/18  Yes [provider]  timolol (TIMOPTIC) 0.5 % ophthalmic solution Place 1 drop into both eyes every morning. 04/04/19  Yes [provider]  umeclidinium-vilanterol (ANORO ELLIPTA) 62.5-25 MCG/INH AEPB INHALE 1 PUFF BY MOUTH EVERY DAY Patient  taking differently: Inhale 1 puff into the lungs daily.  01/15/19  Yes [name], NP  B-D UF III MINI PEN NEEDLES 31G X 5 MM MISC USE FOUR TIMES DAILY 02/25/19   [name], NP  Blood Glucose Monitoring Suppl (ONE TOUCH ULTRA MINI) w/Device KIT 1 each by Does not apply route 2 (two) times daily. Dx: E11.40 08/15/17   Gildardo Cranker, DO  glucose blood test strip Use to test blood sugar three times daily. E11.40 04/15/19   [name], NP  Lancets [facility] DELICA PLUS [id number]) MISC 1 each by Does not apply route 3 (three) times daily. Use to test blood sugar three times daily. Dx: E11.40 12/05/18   [name], NP  pantoprazole (PROTONIX) 40 MG tablet Take 1 tablet (40 mg total) by mouth daily. Patient not taking: Reported on 07/21/2019 05/16/19   [name], NP    Social History   Socioeconomic History  . Marital status: Single    Spouse name: Not on file  . Number of children: 1  . Years of education: Not on file  . Highest education level: Not on file  Occupational History  . Occupation: retired  . Occupation: part time- [employer]  . Financial resource strain: Not hard at all  . Food insecurity    Worry: Never true    Inability: Never true  . Transportation needs    Medical: No    Non-medical: No  Tobacco Use  . Smoking status: Current Every Day Smoker    Packs/day: 1.00    Years: 43.00    Pack years: 43.00    Types: Cigarettes  . Smokeless tobacco: Never Used  Substance and Sexual Activity  . Alcohol use: Yes    Alcohol/week: 12.0 standard drinks    Types: 12 Standard drinks or equivalent per week    Comment: 6x 24oz 8% ABV beers a week  . Drug use: No    Types: Cocaine    Comment: hx of crack/cocaine 39yr ago  . Sexual activity: Not Currently  Lifestyle  . Physical activity    Days per week: 7 days    Minutes per session: 20 min  . Stress: Only a little  Relationships  . Social [place]: Three  times a week    Gets together: Three times a week    Attends religious service: More than 4  times per year    Active member of club or organization: No    Attends meetings of clubs or organizations: Never    Relationship status: Never married  . Intimate partner violence    Fear of current or ex partner: No    Emotionally abused: No    Physically abused: No    Forced sexual activity: No  Other Topics Concern  . Not on file  Social History Narrative   Retired.       As of 05/2015:   Diet: No salt    Caffeine   Married: Single    House: Condo, 2 stories, 1 person (self)    Pets: No    Current/Past profession: N/A   Exercise: walks daily    Living Will: Yes    DNR   POA/HPOA: No         Family History  Problem Relation Age of Onset  . Heart failure Father   . Hypertension Father   . Diabetes Brother   . Heart attack Brother   . Alzheimer's disease Mother   . Stroke Sister   . Diabetes Sister   . Alzheimer's disease Sister   . Hypertension Brother   . Diabetes Brother   . Drug abuse Brother   . Colon cancer Neg Hx     ROS: Otherwise negative unless mentioned in HPI  Physical Examination  Vitals:   07/24/19 0854 07/24/19 0900  BP: (!) 106/57 (!) 116/54  Pulse: (!) 105 98  Resp:    Temp:    SpO2:     Body mass index is 25.14 kg/m.  General:  WDWN in NAD Gait: Not observed HENT: WNL, normocephalic Pulmonary: normal non-labored breathing Cardiac: regular Skin: without rashes Vascular Exam/Pulses: Palpable left radial pulse; palpable thrill throughout forearm Extremities: without ischemic changes, without Gangrene , without cellulitis; without open wounds  Musculoskeletal: no muscle wasting or atrophy  Neurologic: A&O X 3 Psychiatric:  The pt has Normal affect. Lymph:  Unremarkable  CBC    Component Value Date/Time   WBC 10.2 07/24/2019 0241   RBC 2.79 (L) 07/24/2019 0241   HGB 9.3 (L) 07/24/2019 0241   HGB 14.2 08/07/2015 1041   HCT 28.1 (L)  07/24/2019 0241   HCT 41.9 08/07/2015 1041   PLT 177 07/24/2019 0241   PLT 157 08/07/2015 1041   MCV 100.7 (H) 07/24/2019 0241   MCV 97 08/07/2015 1041   MCH 33.3 07/24/2019 0241   MCHC 33.1 07/24/2019 0241   RDW 15.3 07/24/2019 0241   RDW 14.2 08/07/2015 1041   LYMPHSABS 0.9 07/21/2019 0928   LYMPHSABS 3.0 08/07/2015 1041   MONOABS 0.6 07/21/2019 0928   EOSABS 0.1 07/21/2019 0928   EOSABS 0.3 08/07/2015 1041   BASOSABS 0.0 07/21/2019 0928   BASOSABS 0.1 08/07/2015 1041    BMET    Component Value Date/Time   NA 136 07/24/2019 0241   NA 142 03/08/2019   NA 142 03/08/2019   K 4.6 07/24/2019 0241   CL 104 07/24/2019 0241   CO2 17 (L) 07/24/2019 0241   GLUCOSE 147 (H) 07/24/2019 0241   BUN 92 (H) 07/24/2019 0241   BUN 70 (A) 03/08/2019   BUN 70 (A) 03/08/2019   CREATININE 5.70 (H) 07/24/2019 0241   CREATININE 4.94 (H) 06/24/2019 1027   CALCIUM 9.2 07/24/2019 0241   GFRNONAA 9 (L) 07/24/2019 0241   GFRNONAA 14 (L) 03/13/2018 1034   GFRAA 11 (L) 07/24/2019 0241   GFRAA 16 (L) 03/13/2018 1034  COAGS: Lab Results  Component Value Date   INR 1.1 07/21/2019   INR 1.04 02/25/2016   INR 1.29 09/07/2013    ASSESSMENT/PLAN: This is a 68 y.o. male with malfunctioning left radiocephalic fistula  -Plans noted for [facility] placement by interventional radiology today followed by HD treatment -Palpable thrill throughout forearm of left radiocephalic fistula however it does feel small in caliber -We will start with fistula duplex to measure diameters of fistula -Depending on duplex results we will proceed with fistulogram versus conversion to upper arm AV fistula -On-call vascular surgeon Dr. Trula Slade will evaluate patient later today   Dagoberto Ligas PA-C [facility] [phone]  I agree with the above.  Have seen and evaluated the patient.  We have been asked to evaluate him for non-maturing fistula.  On exam there is a palpable thrill within the fistula  however the vein does appear small in caliber.  I have recommended getting a duplex of the fistula to determine the size of the vein.  Based on these results, I will consider whether or not to proceed with a fistulogram or to convert this to an upper arm fistula.  Currently, his ultrasound has not been performed.  I will follow-up once this is been completed.  Annamarie Major

## 2019-07-24 NOTE — Discharge Summary (Signed)
[facility] Discharge Summary  Patient name: Jacob [name] Medical record number: [id number] Date of birth: 06-22-1951 Age: 68 y.o. Gender: male Date of Admission: 07/21/2019  Date of Discharge: 08/01/2019 Admitting Physician: Leeanne Rio, MD  Primary Care Provider: [name], NP Consultants: Cardiology, Nephrology, IR, vascular  Indication for Hospitalization: Hypoxia, chest pain  Discharge Diagnoses/Problem List:  NSTEMI HFrEF (40-45%) ESRD on HD CAD HIV DMT2 Substance use disorder  Disposition: Home  Discharge Condition: Stable  Discharge Exam:  Neuro: Alert and oriented in no apparent distress Heart: Regular rate and rhythm with no murmurs appreciated Lungs CTA bilaterally Abdomen: Bowel sounds present, no abdominal pain on palpation Skin: Warm and dry Extremities: Left hand with no erythema/swelling and tenderness to palpation in the third, fourth, fifth proximal metacarpal heads.  Brief Hospital Course:  Mr. Ionescu was admitted for hypoxia, chest pain, SOB, in the setting of recent cocaine use, alcohol use, and food/salt indiscretions. Patient had uptrending troponins, diffuse ST depressions, and was found to have an NSTEMI. Cardiology recommended angiography, however pt was CKD stage V (not on HD), and suggested deferment of angiography until HD started. Patient became febrile and CAP was suspected based on a cough and CXR with infiltrate vs pulmonary edema, he was treated with doxycycline and ceftriaxone which were broadened to cefepime and vancomycin due to the patient having ongoing fevers. The patient was volume overloaded and resistant to lasix. The decision to start HD was made and his AV fistula was attempted to be cannulated, but infiltrated. IR placed a temporary catheter and dialysis was performed.  The patient was found to be anemic and received 1 unit red blood cells due to having a hemoglobin below 8.0 (threshold for  transfusion).  On the last day of patient's admission he received surgery for a new AV fistula which would then need to mature.  The patient was discharged to continue dialysis Monday Wednesday Friday as an outpatient.  Anemia Mr. Remo was found to have a downtrending hemoglobin during his stay in the hospital.  He required 1 unit of RBCs transfused when his hemoglobin hit a nadir of 7.5 from 11.2 on admission.  Iron studies were drawn after transfusion and not helpful.  Likely combination of anemia of chronic disease in addition to iron deficiency anemia.  FOBT positive.  Would benefit from outpatient follow-up with GI to assess for possible GI bleed.  Current issues of end STEMI, pneumonia and new ESRD make him a poor candidate for GI work-up during this hospitalization.  Following his transfusion, his hemoglobin remained stable between 8 and 9.  Issues for Follow Up:  1. Pneumonia: Last day of antibiotics 10/9.  Assess respiratory status. 2. Follow-up after planned AV fistula procedure on 10/8. 3. Substance use disorder 4. Diabetes: Significantly reduced insulin requirement.  Now taking 6 units insulin at home.  Please assess what other medications may be appropriate for treating Mr. Hoshino now that he has started dialysis. 5. Blood pressure 6. Anemia-follow-up with GI, positive FOBT. 7. Outpatient cardiology follow-up regarding potential NSTEMI, scheduled for end of October. 8. Inpatient [place] done by SLP: 15/30.  Please consider evaluation for neurocognitive disorder.  Significant Procedures: Tunneled AV cath 10/6 Revision of AV fistula 10/8  Significant Labs and Imaging:  Recent Labs  Lab 07/30/19 1219 07/31/19 0318 08/01/19 0325  WBC 7.0 7.0 7.8  HGB 9.7* 8.8* 9.4*  HCT 29.3* 25.8* 27.3*  PLT 280 284 298   Recent Labs  Lab  07/28/19 1326 07/29/19 0357 07/30/19 1219 07/31/19 0318 08/01/19 0325  NA 129* 129* 132* 131* 132*  K 3.2* 3.6 3.9 4.1 3.8  CL 92* 93* 96* 96* 95*   CO2 20* 18* 19* 18* 20*  GLUCOSE 217* 246* 155* 202* 205*  BUN 100* 117* 99* 117* 65*  CREATININE 8.15* 8.94* 7.39* 7.77* 5.73*  CALCIUM 8.1* 8.0* 8.4* 8.4* 8.5*  PHOS  --   --  7.5* 8.2* 6.5*  ALBUMIN  --   --  2.2* 2.0* 2.2*      Results/Tests Pending at Time of Discharge: None  Discharge Medications:  Allergies as of 08/01/2019      Reactions   Augmentin [amoxicillin-pot Clavulanate] Diarrhea   Severe diarrhea    Amphetamines Other (See Comments)   Unknown reaction type      Medication List    STOP taking these medications   colchicine 0.6 MG tablet   furosemide 80 MG tablet Commonly known as: LASIX   insulin lispro 100 UNIT/ML KwikPen Commonly known as: HUMALOG     TAKE these medications   allopurinol 100 MG tablet Commonly known as: ZYLOPRIM TAKE 1 TABLET(100 MG) BY MOUTH DAILY What changed: See the new instructions.   aspirin 81 MG EC tablet Take 1 tablet (81 mg total) by mouth daily. Start taking on: August 02, 2019   atorvastatin 80 MG tablet Commonly known as: LIPITOR Take 1 tablet (80 mg total) by mouth daily.   B-D UF III MINI PEN NEEDLES 31G X 5 MM Misc Generic drug: Insulin Pen Needle USE FOUR TIMES DAILY   Biktarvy 50-200-25 MG Tabs tablet Generic drug: bictegravir-emtricitabine-tenofovir AF TAKE 1 TABLET BY MOUTH DAILY   calcitRIOL 0.25 MCG capsule Commonly known as: ROCALTROL Take 1 capsule by mouth daily.   carvedilol 25 MG tablet Commonly known as: COREG Take 1 tablet (25 mg total) by mouth 2 (two) times daily with a meal.   diclofenac sodium 1 % Gel Commonly known as: VOLTAREN Apply 1 application topically 4 (four) times daily as needed for pain.   Fenofibric Acid 135 MG Cpdr TAKE 1 CAPSULE BY MOUTH DAILY What changed: how much to take   gabapentin 300 MG capsule Commonly known as: NEURONTIN Take 1 capsule (300 mg total) by mouth at bedtime. What changed: when to take this   GLUCOSAMINE CHONDR 1500 COMPLX PO Take 2  tablets by mouth daily.   glucose blood test strip Use to test blood sugar three times daily. E11.40   hydrALAZINE 25 MG tablet Commonly known as: APRESOLINE TAKE 1 TABLET(25 MG) BY MOUTH THREE TIMES DAILY What changed: See the new instructions.   isosorbide mononitrate 30 MG 24 hr tablet Commonly known as: IMDUR TAKE 1 TABLET(30 MG) BY MOUTH DAILY What changed: See the new instructions.   latanoprost 0.005 % ophthalmic solution Commonly known as: XALATAN Place 1 drop into both eyes at bedtime. Reported on 04/04/2016   nitroGLYCERIN 0.3 MG SL tablet Commonly known as: NITROSTAT ONE TABLET UNDER TONGUE AS NEEDED FOR CHEST PAIN What changed: See the new instructions.   ONE TOUCH ULTRA MINI w/Device Kit 1 each by Does not apply route 2 (two) times daily. Dx: M46.80   OneTouch Delica Plus [id number] Misc 1 each by Does not apply route 3 (three) times daily. Use to test blood sugar three times daily. Dx: E11.40   pantoprazole 40 MG tablet Commonly known as: PROTONIX Take 1 tablet (40 mg total) by mouth daily.   sevelamer carbonate 800 MG tablet  Commonly known as: RENVELA Take 1 tablet (800 mg total) by mouth 3 (three) times daily with meals.   simethicone 80 MG chewable tablet Commonly known as: MYLICON Chew 1 tablet (80 mg total) by mouth 4 (four) times daily as needed for flatulence.   sodium bicarbonate 650 MG tablet Take 650 mg by mouth 2 (two) times daily.   timolol 0.5 % ophthalmic solution Commonly known as: TIMOPTIC Place 1 drop into both eyes every morning.   Toujeo Max SoloStar 300 UNIT/ML Sopn Generic drug: Insulin Glargine (2 Unit Dial) Inject 6 Units into the skin daily. What changed: how much to take   Tylenol 8 Hour 650 MG CR tablet Generic drug: acetaminophen Take 650 mg by mouth every 8 (eight) hours as needed for pain.   umeclidinium-vilanterol 62.5-25 MCG/INH Aepb Commonly known as: Anoro Ellipta INHALE 1 PUFF BY MOUTH EVERY DAY What changed:    how much to take  how to take this  when to take this  additional instructions   Vitamin D3 50 MCG (2000 UT) Tabs Take 2,000 Units by mouth daily.       Discharge Instructions: Please refer to Patient Instructions section of EMR for full details.  Patient was counseled important signs and symptoms that should prompt return to medical care, changes in medications, dietary instructions, activity restrictions, and follow up appointments.   Follow-Up Appointments: Follow-up Information    Isaiah Serge, NP Follow up.   Specialties: Cardiology, Radiology Why: [facility] location - see appointment information below for 08/21/19. Mickel Baas is one of the nurse practitioners that works with our cardiology team. Contact information: [address] [place] [phone]           Lurline Del, [place] 08/01/2019, 5:25 PM PGY-1, [place]

## 2019-07-24 NOTE — H&P (Signed)
Patient Status: [facility] - In-pt  Assessment and Plan:  Patient in need of venous access for emergent hemodialysis today due to inability to cannulate left upper extremity fistula yesterday - will place temporary HD catheter today and plan for conversion to tunneled HD catheter later this week or early next week. Patient ate breakfast this morning and due to IR schedule we cannot place tunneled catheter this afternoon when he is safe for sedation. Patient is in agreement with this plan.  Afebrile currently - febrile at 102 earlier this morning which improved to 99.0 with APAP administration, WBC 10.2, hgb 9.3, plt 177.    Risks and benefits discussed with the patient including, but not limited to bleeding, infection, vascular injury, pneumothorax which may require chest tube placement, air embolism or even death.  All of the patient's questions were answered, patient is agreeable to proceed.  Consent signed and in chart. ______________________________________________________________________   History of Present Illness: Jacob [name] is a 68 y.o. male with past medical history significant for CKD with progression to ESRD requiring initiation of HD during this admission who presents to IR today for a temporary dialysis catheter placement. He currently has a left upper extremity fistula which was placed 07/2018 by Dr. Carlis Abbott, however staff attempted to cannulate the fistula yesterday for dialysis and they were unsuccessful. Patient reports he has never had dialysis before and this was the first time an attempt at using his fistula was made. He was seen by vascular surgery today who plans to further evaluate fistula during this admission. Request has been made to IR for placement of a dialysis catheter in order to initiate HD today.  Patient reports he feels "just terrible" - he states he is having pain all over, trouble breathing and nausea. He was able to eat breakfast this morning without  issue. He states understanding of requested procedure and wishes to proceed.   Allergies and medications reviewed.   Review of Systems: A 12 point ROS discussed and pertinent positives are indicated in the HPI above.  All other systems are negative.  Review of Systems  Constitutional: Positive for appetite change and fatigue. Negative for chills and fever.  Respiratory: Positive for shortness of breath. Negative for cough.   Gastrointestinal: Positive for abdominal pain and nausea. Negative for diarrhea and vomiting.  Neurological: Negative for dizziness and headaches.    Vital Signs: BP 130/68   Pulse 94   Temp 99.6 F (37.6 C) (Oral)   Resp 20   Ht 6\' 2"  (1.88 m)   Wt 195 lb 12.8 oz (88.8 kg)   SpO2 96%   BMI 25.14 kg/m   Physical Exam Vitals signs and nursing note reviewed.  Constitutional:      General: He is in acute distress.     Appearance: He is ill-appearing.  HENT:     Head: Normocephalic.     Mouth/Throat:     Mouth: Mucous membranes are moist.     Pharynx: Oropharynx is clear. No oropharyngeal exudate or posterior oropharyngeal erythema.  Cardiovascular:     Rate and Rhythm: Normal rate and regular rhythm.     Comments: (+) left forearm fistula - palpable thrill, audible bruit. Pulmonary:     Effort: Pulmonary effort is normal.     Breath sounds: Normal breath sounds.  Abdominal:     General: There is no distension.     Palpations: Abdomen is soft.     Tenderness: There is no abdominal tenderness.  Skin:  General: Skin is warm and dry.  Neurological:     Mental Status: He is alert and oriented to person, place, and time.  Psychiatric:        Mood and Affect: Mood normal.        Behavior: Behavior normal.        Thought Content: Thought content normal.        Judgment: Judgment normal.      Imaging reviewed.   Labs:  COAGS: Recent Labs    07/21/19 0928  INR 1.1    BMP: Recent Labs    07/21/19 1821 07/21/19 2330 07/23/19 0608  07/24/19 0241  NA 135 135 138 136  K 4.4 4.2 4.5 4.6  CL 104 104 106 104  CO2 21* 20* 22 17*  GLUCOSE 152* 149* 128* 147*  BUN 73* 74* 80* 92*  CALCIUM 8.6* 8.6* 9.3 9.2  CREATININE 5.71* 5.79* 5.99* 5.70*  GFRNONAA 9* 9* 9* 9*  GFRAA 11* 11* 10* 11*    Electronically Signed: Joaquim Nam, PA-C 07/24/2019, 11:31 AM   I spent a total of 15 minutes in face to face in clinical consultation, greater than 50% of which was counseling/coordinating care for venous access.

## 2019-07-25 ENCOUNTER — Inpatient Hospital Stay (HOSPITAL_COMMUNITY): Payer: Medicare Other

## 2019-07-25 DIAGNOSIS — R509 Fever, unspecified: Secondary | ICD-10-CM

## 2019-07-25 DIAGNOSIS — Z992 Dependence on renal dialysis: Secondary | ICD-10-CM

## 2019-07-25 DIAGNOSIS — N186 End stage renal disease: Secondary | ICD-10-CM

## 2019-07-25 DIAGNOSIS — R05 Cough: Secondary | ICD-10-CM

## 2019-07-25 LAB — CBC
HCT: 22.9 % — ABNORMAL LOW (ref 39.0–52.0)
HCT: 23.3 % — ABNORMAL LOW (ref 39.0–52.0)
Hemoglobin: 7.6 g/dL — ABNORMAL LOW (ref 13.0–17.0)
Hemoglobin: 7.5 g/dL — ABNORMAL LOW (ref 13.0–17.0)
MCH: 33.5 pg (ref 26.0–34.0)
MCH: 32.1 pg (ref 26.0–34.0)
MCHC: 33.2 g/dL (ref 30.0–36.0)
MCHC: 32.2 g/dL (ref 30.0–36.0)
MCV: 100.9 fL — ABNORMAL HIGH (ref 80.0–100.0)
MCV: 99.6 fL (ref 80.0–100.0)
Platelets: 156 10*3/uL (ref 150–400)
Platelets: 164 10*3/uL (ref 150–400)
RBC: 2.27 MIL/uL — ABNORMAL LOW (ref 4.22–5.81)
RBC: 2.34 MIL/uL — ABNORMAL LOW (ref 4.22–5.81)
RDW: 15.5 % (ref 11.5–15.5)
RDW: 15.1 % (ref 11.5–15.5)
WBC: 8.1 10*3/uL (ref 4.0–10.5)
WBC: 6.8 10*3/uL (ref 4.0–10.5)
nRBC: 0 % (ref 0.0–0.2)
nRBC: 0 % (ref 0.0–0.2)

## 2019-07-25 LAB — RENAL FUNCTION PANEL
Albumin: 2.5 g/dL — ABNORMAL LOW (ref 3.5–5.0)
Anion gap: 13 (ref 5–15)
BUN: 77 mg/dL — ABNORMAL HIGH (ref 8–23)
CO2: 22 mmol/L (ref 22–32)
Calcium: 8.8 mg/dL — ABNORMAL LOW (ref 8.9–10.3)
Chloride: 103 mmol/L (ref 98–111)
Creatinine, Ser: 5.42 mg/dL — ABNORMAL HIGH (ref 0.61–1.24)
GFR calc Af Amer: 11 mL/min — ABNORMAL LOW (ref >60)
GFR calc non Af Amer: 10 mL/min — ABNORMAL LOW (ref >60)
Glucose, Bld: 147 mg/dL — ABNORMAL HIGH (ref 70–99)
Phosphorus: 4.6 mg/dL (ref 2.5–4.6)
Potassium: 4.3 mmol/L (ref 3.5–5.1)
Sodium: 138 mmol/L (ref 135–145)

## 2019-07-25 LAB — GLUCOSE, CAPILLARY
Glucose-Capillary: 164 mg/dL — ABNORMAL HIGH (ref 70–99)
Glucose-Capillary: 255 mg/dL — ABNORMAL HIGH (ref 70–99)
Glucose-Capillary: 161 mg/dL — ABNORMAL HIGH (ref 70–99)
Glucose-Capillary: 158 mg/dL — ABNORMAL HIGH (ref 70–99)
Glucose-Capillary: 170 mg/dL — ABNORMAL HIGH (ref 70–99)

## 2019-07-25 LAB — PREPARE RBC (CROSSMATCH)

## 2019-07-25 LAB — HEPATITIS B SURFACE ANTIGEN: Hepatitis B Surface Ag: NONREACTIVE

## 2019-07-25 LAB — HEPARIN LEVEL (UNFRACTIONATED): Heparin Unfractionated: 0.25 IU/mL — ABNORMAL LOW (ref 0.30–0.70)

## 2019-07-25 LAB — URINE CULTURE

## 2019-07-25 MED ORDER — VANCOMYCIN VARIABLE DOSE PER UNSTABLE RENAL FUNCTION (PHARMACIST DOSING): Status: DC

## 2019-07-25 MED ORDER — SODIUM CHLORIDE 0.9% IV SOLUTION
Freq: Once | INTRAVENOUS | Status: AC
  Administered 2019-07-26: via INTRAVENOUS

## 2019-07-25 MED ORDER — HEPARIN SODIUM (PORCINE) 5000 UNIT/ML IJ SOLN
5000.0000 [IU] | Freq: Three times a day (TID) | INTRAMUSCULAR | Status: DC
  Administered 2019-07-27 – 2019-07-28 (×4): 5000 [IU] via SUBCUTANEOUS
  Filled 2019-07-25 (×7): qty 1

## 2019-07-25 MED ORDER — VANCOMYCIN HCL 10 G IV SOLR
1750.0000 mg | Freq: Once | INTRAVENOUS | Status: AC
  Administered 2019-07-25: 18:00:00 1750 mg via INTRAVENOUS
  Filled 2019-07-25: qty 1750

## 2019-07-25 MED ORDER — HEPARIN SODIUM (PORCINE) 1000 UNIT/ML IJ SOLN
INTRAMUSCULAR | Status: AC
  Filled 2019-07-25: qty 3

## 2019-07-25 MED ORDER — HYDROMORPHONE HCL 1 MG/ML IJ SOLN
1.0000 mg | Freq: Once | INTRAMUSCULAR | Status: AC
  Administered 2019-07-25: 1 mg via INTRAVENOUS

## 2019-07-25 MED ORDER — SODIUM CHLORIDE 0.9 % IV SOLN
1.0000 g | INTRAVENOUS | Status: DC
  Administered 2019-07-25 – 2019-07-26 (×2): 1 g via INTRAVENOUS
  Filled 2019-07-25 (×3): qty 1

## 2019-07-25 MED ORDER — DICLOFENAC SODIUM 1 % TD GEL
2.0000 g | Freq: Four times a day (QID) | TRANSDERMAL | Status: DC
  Administered 2019-07-25 – 2019-07-31 (×5): 2 g via TOPICAL
  Filled 2019-07-25: qty 100

## 2019-07-25 MED ORDER — HYDROMORPHONE HCL 1 MG/ML IJ SOLN
INTRAMUSCULAR | Status: AC
  Filled 2019-07-25: qty 1

## 2019-07-25 NOTE — Progress Notes (Signed)
Renal Navigator submitted referral to [facility] Admissions for OP HD treatment to HD center located closest to patient's home, which is [facility]. Per report from MD and HD RN, patient has tolerated first two sessions of HD well.  Renal Navigator will follow closely and follow up with Nephrologist and patient regarding acceptance and seat schedule.  Alphonzo Cruise, [place] Renal Navigator [phone]

## 2019-07-25 NOTE — Progress Notes (Signed)
Awaiting duplex to make final plans   Jacob [name]

## 2019-07-25 NOTE — Progress Notes (Signed)
MD on call informed that patient very agitated calling out pulling heart monitor. Arthor Captain LPN

## 2019-07-25 NOTE — Progress Notes (Signed)
Left arm dialysis AVF study -  Patient refused study, this is the second attempt. Oda Cogan, BS, RDMS, RVT

## 2019-07-25 NOTE — Procedures (Signed)
Patient seen on Hemodialysis. BP 123/89   Pulse 98   Temp 99.8 F (37.7 C) (Oral)   Resp 20   Ht 6\' 2"  (1.88 m)   Wt 89 kg   SpO2 95%   BMI 25.19 kg/m   QB 250, UF goal 1L Uncomfortable with aches/pain on dialysis--will reorder dilaudid.   Elmarie Shiley MD [facility]. Office # [phone] Pager # [phone] 9:11 AM

## 2019-07-25 NOTE — Progress Notes (Signed)
Patient continues to moan and call out when asked in pain he says I don't know CBG was 156 at 2156 re assess 164 patient removed gown said hot. Adjusted room temp and patient given cup of ice. Arthor Captain LPN

## 2019-07-25 NOTE — Progress Notes (Signed)
Family Medicine Teaching Service Daily Progress Note Intern Pager: [phone]  Patient name: Jacob [name] Medical record number: [id number] Date of birth: 1951/06/09 Age: 68 y.o. Gender: male  Primary Care Provider: [name], NP Consultants: Cardiology, Nephrology Code Status: Full  Pt Overview and Major Events to Date:  9/27- admitted, NSTEMI 9/29-unable to cannulate AV fistula 9/30-plan to go to IR for temporary catheter for HD  Assessment and Plan: Jacob [name] is a 68 y.o. male presenting with dyspnea, chest pain. PMH is significant for CHF, CAD, cardiomyopathy, HIV, DMT2, substance use disorder.  NSTEMI  Atypical Chest pain, resolved  H/o CAD s/p CABG 2004 Patient denies chest pain this morning, 10/1. Had temporary HD cath placed yesterday and underwent a short bout of dialysis at that time. Currently in HD this morning. Will follow up with cardiology re: plans for angiogram s/p HD.  Home meds include high intensity statin. - continue home meds - cards consulted, appreciate recs  - heparin drip stopped due to decrease in hemoglobin (9.7-7.6), transitioned to sub-q heparin for dvt ppx - nephrology consulted, appreciate recommendations -IR consulted, appreciate recommendations  Acute Hypoxic Respiratory Failure likely 2/2 CHF exacerbation  HFrEF (EF 40-45%): Patient has been afebrile since 1600 last night, 9/30.  Due to concern for CAP on 9/29 due to new cough, chest x-ray with edema versus infiltrates, patient was started on ceftriaxone and doxycycline (we did not use azithromycin due to patient's prolonged QT of 533 on 9/28). Patient currently on 4L nasal cannula.  He does not have a home oxygen requirement.  He did not receive Lasix as he has resistance to it.  KUB negative for SBO, abdominal ultrasound negative for ascites and free fluid, positive for echogenic liver-most likely hepatic steatosis.  Home meds are Coreg 25 mg BID, Lasix 160 mg TID, Imdur 30 mg,  nitroglycerin. - strict I/O's, daily weights - Continuous pulse ox - O2 supplementation, maintain oxygenation >88% via [place] - AM CBC, CMP  - hold home meds  Concern for CAP Patient with new onset cough since 9/29, T-max of 98.74F today with most recent fever 101.3 at 1600 on 9/30.  CXR reveals possible new infiltrate versus pulmonary edema. Patient does have known HFrEF of 40 to 45% with recent NSTEMI, however due to fevers we will cover for CAP.   -Currently on Rocephin and doxycycline -monitor fever curve, sx   ESRD now on HD Patient had temporary catheter placed yesterday to receive HD, currently in HD this morning.  VVS to perform doppler of AV fistula to assess long-term access. CKD Stage V since 02/2018.  AV fistula placed in October 2019 by Dr. Monica Martinez, VVS. Follows with Dr. [name], Nephrology, for anemia of chronic disease.  - monitor with daily BMP - nephology consult  Anemia of Chronic Disease  H/O GI Bleed (01/2019)- ?worsening Hemoglobin this morning 10/1 at 7.6, down from 9.7 on admission.  MCV elevated to 100.9.  No signs of bleeding on exam and patient denies blood in stool or dark stools.  Patient hospitalized for acute GI bleed in April 2020.  - repeat CBC pending - heparin drip stopped, transition to sub-q tonight - Transfusion threshold 8, will plan to transfuse if repeat CBC shows hgb under 8.   Polysubstance Use Disorder Last reported intermittent cocaine use, last used Saturday. Smokes 1PPD x >45 years. Drinks ~6-32oz beers on weekends. Last drink 9/26. UDS reveals positive for opiates as well, no history of opiates given this admission or  by EMS. - Nicotine patch PRN - encourage cessation of drug use  Elevated LFTs-resolved Patient reported that he drank six 32 ounce beers over the weekend,  likely due to alcohol intake. -stable  T2DM: Home meds include: Toujeo 88U QD and Humalog 20U TID. Overnight BGs in 150s-160s. Sensitive SSI appropriate for now,  will continue to monitor. - continue sensitive SSI - BG qAC, qHS, qAM fasting  COPD Has 45-pack-year smoking history.  He smokes a pack per day.  Home medication is Incruse Ellipta. -Continue home medications  HTN Blood pressures elevated on admission: 170-190's/90's.  Overnight BP range from 143-178 / 58-118.  This morning blood pressure 130/64.  Home medications include hydralazine 3 times daily, Coreg.  -Continue hydralazine and coreg  HIV Home med is Biktarvy. HIV-1 RNA quantification was not detectable on 06/24/2019. He follows with Dr. [name] at [facility]. Last appt 07/08/2019. Good control. -Continue home meds  Gout Home meds include colchicine and allopurinol. - hold colchicine - continue allopurinol  HLD Home meds are high intensity statin. -Continue atorvastatin 80 mg  FEN/GI: heart healthy/carb modified/renal diet PPx: Sub-q heparin for ppx  Disposition: to cardiac telemetry pending further work up and stabilization  Subjective:  Patient with no complaints this morning, currently in HD. He denies any blood in stool or black stools and currently denies abdominal pains. Patient currently breathing comfortably on 4L nasal cannula.  Objective: Temp:  [97.6 F (36.4 C)-101.3 F (38.5 C)] 99.8 F (37.7 C) (10/01 0810) Pulse Rate:  [84-114] 88 (10/01 0930) Resp:  [18-27] 20 (10/01 0810) BP: (97-178)/(51-118) 97/72 (10/01 0930) SpO2:  [87 %-96 %] 95 % (10/01 0810) Weight:  [89 kg-90.6 kg] 89 kg (10/01 0750)   Physical Exam: General: Alert and oriented in no apparent distress Heart: Regular rate and rhythm with no murmurs appreciated Lungs: Some crackles present bilaterally with mild wheezing during expiration. Abdomen: Bowel sounds present, no abdominal pain Extremities: No lower extremity edema  Laboratory: Recent Labs  Lab 07/23/19 0608 07/24/19 0241 07/25/19 0008  WBC 8.7 10.2 8.1  HGB 9.1* 9.3* 7.6*  HCT 28.4* 28.1* 22.9*  PLT 202 177 156   Recent  Labs  Lab 07/21/19 0928  07/23/19 0608 07/24/19 0241 07/25/19 0008  NA 140   < > 138 136 138  K 4.5   < > 4.5 4.6 4.3  CL 108   < > 106 104 103  CO2 23   < > 22 17* 22  BUN 72*   < > 80* 92* 77*  CREATININE 5.55*   < > 5.99* 5.70* 5.42*  CALCIUM 9.0   < > 9.3 9.2 8.8*  PROT 7.7  --  7.6  --   --   BILITOT 0.7  --  1.2  --   --   ALKPHOS 42  --  43  --   --   ALT 51*  --  34  --   --   AST 95*  --  47*  --   --   GLUCOSE 110*   < > 128* 147* 147*   < > = values in this interval not displayed.    Imaging/Diagnostic Tests: Ir Fluoro Guide Cv Line Right  Result Date: 07/24/2019 INDICATION: End-stage renal disease requiring dialysis. EXAM: FLUOROSCOPIC AND ULTRASOUND GUIDED PLACEMENT OF A NON-TUNNELED DIALYSIS CATHETER MEDICATIONS: None ANESTHESIA/SEDATION: Local anesthetic only FLUOROSCOPY TIME:  Fluoroscopy Time: 0 minutes 18 seconds (2 mGy). COMPLICATIONS: None immediate. PROCEDURE: Informed consent was obtained for catheter placement. The  patient was placed supine on the interventional table. Ultrasound confirmed a patent right internal jugular vein. Ultrasound images were obtained for documentation. The right neck was prepped and draped in a sterile fashion. The right neck was anesthetized with 1% lidocaine. Maximal barrier sterile technique was utilized including caps, mask, sterile gowns, sterile gloves, sterile drape, hand hygiene and skin antiseptic. A small incision was made with #11 blade scalpel. A 21 gauge needle directed into the right internal jugular vein with ultrasound guidance. A micropuncture dilator set was placed. A 16 cm Mahurkar catheter was selected. The catheter was advanced over a wire and positioned at the superior cavoatrial junction. Fluoroscopic images were obtained for documentation. Both dialysis lumens were found to aspirate and flush well. The proper amount of heparin was flushed in both lumens. The central venous lumen was flushed with normal saline. Catheter  was sutured to skin. FINDINGS: Catheter tip at the superior cavoatrial junction. IMPRESSION: Successful placement of a 16 cm non-tunneled dialysis catheter using ultrasound and fluoroscopic guidance. The catheter is ready for immediate use as clinically indicated. Electronically Signed   By: Constance Holster M.D.   On: 07/24/2019 13:41   Ir US Guide Vasc Access Right  Result Date: 07/24/2019 INDICATION: End-stage renal disease requiring dialysis. EXAM: FLUOROSCOPIC AND ULTRASOUND GUIDED PLACEMENT OF A NON-TUNNELED DIALYSIS CATHETER MEDICATIONS: None ANESTHESIA/SEDATION: Local anesthetic only FLUOROSCOPY TIME:  Fluoroscopy Time: 0 minutes 18 seconds (2 mGy). COMPLICATIONS: None immediate. PROCEDURE: Informed consent was obtained for catheter placement. The patient was placed supine on the interventional table. Ultrasound confirmed a patent right internal jugular vein. Ultrasound images were obtained for documentation. The right neck was prepped and draped in a sterile fashion. The right neck was anesthetized with 1% lidocaine. Maximal barrier sterile technique was utilized including caps, mask, sterile gowns, sterile gloves, sterile drape, hand hygiene and skin antiseptic. A small incision was made with #11 blade scalpel. A 21 gauge needle directed into the right internal jugular vein with ultrasound guidance. A micropuncture dilator set was placed. A 16 cm Mahurkar catheter was selected. The catheter was advanced over a wire and positioned at the superior cavoatrial junction. Fluoroscopic images were obtained for documentation. Both dialysis lumens were found to aspirate and flush well. The proper amount of heparin was flushed in both lumens. The central venous lumen was flushed with normal saline. Catheter was sutured to skin. FINDINGS: Catheter tip at the superior cavoatrial junction. IMPRESSION: Successful placement of a 16 cm non-tunneled dialysis catheter using ultrasound and fluoroscopic guidance. The  catheter is ready for immediate use as clinically indicated. Electronically Signed   By: Constance Holster M.D.   On: 07/24/2019 13:41    Lurline Del, DO 07/25/2019, 9:44 AM PGY-1, [place] Intern pager: [phone], text pages welcome

## 2019-07-25 NOTE — Progress Notes (Addendum)
Progress Note  Patient Name: Jacob [name] Date of Encounter: 07/25/2019  Primary Cardiologist: No primary care provider on file.   Subjective   No chest pain, abd pain is better. Undergoing HD  Inpatient Medications    Scheduled Meds:  allopurinol  100 mg Oral Daily   aspirin EC  81 mg Oral Daily   atorvastatin  80 mg Oral Daily   bictegravir-emtricitabine-tenofovir AF  1 tablet Oral QPM   carvedilol  25 mg Oral BID WC   Chlorhexidine Gluconate Cloth  6 each Topical Q0600   doxycycline  100 mg Oral Q12H   gabapentin  300 mg Oral QHS   hydrALAZINE  25 mg Oral TID   insulin aspart  0-9 Units Subcutaneous TID WC   isosorbide mononitrate  30 mg Oral Daily   umeclidinium-vilanterol  1 puff Inhalation Daily   Continuous Infusions:  sodium chloride 1,000 mL (07/22/19 2100)   sodium chloride     sodium chloride     cefTRIAXone (ROCEPHIN)  IV 2 g (07/24/19 2025)   heparin 2,550 Units/hr (07/25/19 0230)   PRN Meds: sodium chloride, sodium chloride, sodium chloride, acetaminophen **OR** acetaminophen, alteplase, guaiFENesin, heparin, heparin, lidocaine (PF), lidocaine (PF), lidocaine-prilocaine, pentafluoroprop-tetrafluoroeth, simethicone, sodium chloride flush   Vital Signs    Vitals:   07/24/19 2018 07/25/19 0147 07/25/19 0635 07/25/19 0750  BP:  130/64    Pulse: (!) 108 100    Resp:  19    Temp:  98.2 F (36.8 C)    TempSrc:  Oral    SpO2: 96% 92%    Weight:   89 kg 89 kg  Height:        Intake/Output Summary (Last 24 hours) at 07/25/2019 0847 Last data filed at 07/24/2019 1822 Gross per 24 hour  Intake 222.14 ml  Output 600 ml  Net -377.86 ml   Last 3 Weights 07/25/2019 07/25/2019 07/24/2019  Weight (lbs) 196 lb 3.4 oz 196 lb 4.8 oz 197 lb 8.5 oz  Weight (kg) 89 kg 89.041 kg 89.6 kg      Telemetry    SR while in HD - Personally Reviewed  ECG    N/a - Personally Reviewed  Physical Exam  Older AAM, laying bed. GEN: No acute distress.    Neck: No JVD Cardiac: RRR, no murmurs, rubs, or gallops.  Respiratory: Diminished throughout GI: Soft, nontender, non-distended  MS: No edema; No deformity. Neuro:  Nonfocal  Psych: Normal affect   Labs    High Sensitivity Troponin:   Recent Labs  Lab 07/21/19 0928 07/21/19 1307 07/21/19 1943 07/21/19 2330  TROPONINIHS 284* 585* 899* 864*      Chemistry Recent Labs  Lab 07/21/19 0928  07/23/19 0608 07/24/19 0241 07/25/19 0008  NA 140   < > 138 136 138  K 4.5   < > 4.5 4.6 4.3  CL 108   < > 106 104 103  CO2 23   < > 22 17* 22  GLUCOSE 110*   < > 128* 147* 147*  BUN 72*   < > 80* 92* 77*  CREATININE 5.55*   < > 5.99* 5.70* 5.42*  CALCIUM 9.0   < > 9.3 9.2 8.8*  PROT 7.7  --  7.6  --   --   ALBUMIN 3.2*   < > 3.0* 2.9* 2.5*  AST 95*  --  47*  --   --   ALT 51*  --  34  --   --  ALKPHOS 42  --  43  --   --   BILITOT 0.7  --  1.2  --   --   GFRNONAA 10*   < > 9* 9* 10*  GFRAA 11*   < > 10* 11* 11*  ANIONGAP 9   < > 10 15 13    < > = values in this interval not displayed.     Hematology Recent Labs  Lab 07/23/19 0608 07/24/19 0241 07/25/19 0008  WBC 8.7 10.2 8.1  RBC 2.82* 2.79* 2.27*  HGB 9.1* 9.3* 7.6*  HCT 28.4* 28.1* 22.9*  MCV 100.7* 100.7* 100.9*  MCH 32.3 33.3 33.5  MCHC 32.0 33.1 33.2  RDW 15.0 15.3 15.5  PLT 202 177 156    BNP Recent Labs  Lab 07/21/19 0928  BNP 1,535.2*     DDimer No results for input(s): DDIMER in the last 168 hours.   Radiology    Dg Abd 1 View  Result Date: 07/23/2019 CLINICAL DATA:  Persistent abdominal pain EXAM: ABDOMEN - 1 VIEW COMPARISON:  CT 11/03/2015 FINDINGS: Vascular calcifications in the pelvis. Prior cholecystectomy. No bowel obstruction, free air, or organomegaly. IMPRESSION: No acute findings. Electronically Signed   By: Rolm Baptise M.D.   On: 07/23/2019 11:17   US Abdomen Complete  Result Date: 07/23/2019 CLINICAL DATA:  Abdominal distension. EXAM: ABDOMEN ULTRASOUND COMPLETE COMPARISON:  CT  scan of November 03, 2015. FINDINGS: Gallbladder: Status post cholecystectomy. Common bile duct: Diameter: Not visualized, but no definite dilatation is noted. Liver: No focal lesion identified. Increased echogenicity of hepatic parenchyma is noted. Portal vein is patent on color Doppler imaging with normal direction of blood flow towards the liver. IVC: No abnormality visualized. Pancreas: Not visualized due to overlying bowel gas. Spleen: Size and appearance within normal limits. Right Kidney: Length: 12.1 cm. 1 cm cyst is noted in lower pole. Increased echogenicity of renal parenchyma is noted. No mass or hydronephrosis visualized. Left Kidney: Length: 12.9 cm. 8 mm cyst is noted in midpole. Increased echogenicity of renal parenchyma is noted. No mass or hydronephrosis visualized. Abdominal aorta: No aneurysm visualized. Other findings: None. IMPRESSION: Increased echogenicity of renal parenchyma is noted suggesting hepatic steatosis. Increased echogenicity of renal parenchyma is noted bilaterally suggesting medical renal disease. No hydronephrosis or renal obstruction is noted. Small bilateral simple renal cysts are noted. Pancreas not visualized due to overlying bowel gas. Status post cholecystectomy. Electronically Signed   By: Marijo Conception M.D.   On: 07/23/2019 15:24   Ir Fluoro Guide Cv Line Right  Result Date: 07/24/2019 INDICATION: End-stage renal disease requiring dialysis. EXAM: FLUOROSCOPIC AND ULTRASOUND GUIDED PLACEMENT OF A NON-TUNNELED DIALYSIS CATHETER MEDICATIONS: None ANESTHESIA/SEDATION: Local anesthetic only FLUOROSCOPY TIME:  Fluoroscopy Time: 0 minutes 18 seconds (2 mGy). COMPLICATIONS: None immediate. PROCEDURE: Informed consent was obtained for catheter placement. The patient was placed supine on the interventional table. Ultrasound confirmed a patent right internal jugular vein. Ultrasound images were obtained for documentation. The right neck was prepped and draped in a sterile  fashion. The right neck was anesthetized with 1% lidocaine. Maximal barrier sterile technique was utilized including caps, mask, sterile gowns, sterile gloves, sterile drape, hand hygiene and skin antiseptic. A small incision was made with #11 blade scalpel. A 21 gauge needle directed into the right internal jugular vein with ultrasound guidance. A micropuncture dilator set was placed. A 16 cm Mahurkar catheter was selected. The catheter was advanced over a wire and positioned at the superior cavoatrial junction. Fluoroscopic images  were obtained for documentation. Both dialysis lumens were found to aspirate and flush well. The proper amount of heparin was flushed in both lumens. The central venous lumen was flushed with normal saline. Catheter was sutured to skin. FINDINGS: Catheter tip at the superior cavoatrial junction. IMPRESSION: Successful placement of a 16 cm non-tunneled dialysis catheter using ultrasound and fluoroscopic guidance. The catheter is ready for immediate use as clinically indicated. Electronically Signed   By: Constance Holster M.D.   On: 07/24/2019 13:41   Ir US Guide Vasc Access Right  Result Date: 07/24/2019 INDICATION: End-stage renal disease requiring dialysis. EXAM: FLUOROSCOPIC AND ULTRASOUND GUIDED PLACEMENT OF A NON-TUNNELED DIALYSIS CATHETER MEDICATIONS: None ANESTHESIA/SEDATION: Local anesthetic only FLUOROSCOPY TIME:  Fluoroscopy Time: 0 minutes 18 seconds (2 mGy). COMPLICATIONS: None immediate. PROCEDURE: Informed consent was obtained for catheter placement. The patient was placed supine on the interventional table. Ultrasound confirmed a patent right internal jugular vein. Ultrasound images were obtained for documentation. The right neck was prepped and draped in a sterile fashion. The right neck was anesthetized with 1% lidocaine. Maximal barrier sterile technique was utilized including caps, mask, sterile gowns, sterile gloves, sterile drape, hand hygiene and skin  antiseptic. A small incision was made with #11 blade scalpel. A 21 gauge needle directed into the right internal jugular vein with ultrasound guidance. A micropuncture dilator set was placed. A 16 cm Mahurkar catheter was selected. The catheter was advanced over a wire and positioned at the superior cavoatrial junction. Fluoroscopic images were obtained for documentation. Both dialysis lumens were found to aspirate and flush well. The proper amount of heparin was flushed in both lumens. The central venous lumen was flushed with normal saline. Catheter was sutured to skin. FINDINGS: Catheter tip at the superior cavoatrial junction. IMPRESSION: Successful placement of a 16 cm non-tunneled dialysis catheter using ultrasound and fluoroscopic guidance. The catheter is ready for immediate use as clinically indicated. Electronically Signed   By: Constance Holster M.D.   On: 07/24/2019 13:41    Cardiac Studies   N/a  Patient Profile     68 y.o. male with a hx of CAD s/p CABG 2005, cardiomyopathy LV EF 30% in 2015 now 40-45% (SPECT 2016 w/scar and no ischemia),CKD with Cr 5-6,HIV,HBV, GI bleed in 01/2019 requiring transfusion,cocaine use, HTN, HLD who presents with shortness of breathand chest tightness.   Assessment & Plan    1.CAD s/p CABG/NSTEMI:HsT peaked at 899. Discussions around cardiac cath given symptoms of unstable angina and mildly elevated troponin. This has been postponed due to hisworsening renal function.Now on HD, seems to be tolerating ok. No recurrent chest pain since admission. Timing of cath to be determine based on HD tolerance and work up of possible infection.   2. Ischemic cardiomyopathy/Acute on chronic systolic and diastolic CHF: Volume removal with HD. On BB. No ACEi/ARB with renal disease.  3. CKD stage V: Had HD last night, and again this morning.   4. Polysubstance abuse: recent use of cocaine over the weekend. Cessation advised.  5. Fever/abd pain: temp max  101. BC with no growth to date. Abd [place] and xray negative. Says Abd pain is better today. Diarrhea has slowed.   6. CAP: receiving antibiotics per primary.   7. Anemia: notable drop in Hgb 9.3>>7.6 this morning. Denies any active bleeding. Does have hx of GI prior this year. No active chest pain currently. Will stop IV heparin for now. Recheck CBC ordered.   For questions or updates, please contact [place] Please  consult [email or web address] for contact info under     Signed, Reino Bellis, NP  07/25/2019, 8:47 AM    Patient seen, examined. Available data reviewed. Agree with findings, assessment, and plan as outlined by Reino Bellis, NP. The patient is frail-appearing, in NAD. He is drowsy. Lungs with diffuse rales. CV: RRR no murmur, ext: mild diffuse edema.  Pt with multiple medical problems, being initiated on dialysis. I think best to manage CAD medically and consider cath in the future depending on how he tolerates dialysis. Will follow from a distance.   Sherren Mocha, M.D. 07/25/2019 12:53 PM

## 2019-07-25 NOTE — Progress Notes (Signed)
ANTICOAGULATION CONSULT NOTE  Pharmacy Consult for Heparin Indication: chest pain/ACS  Heparin Dosing Weight: 91 kg  Labs: Recent Labs    07/23/19 0608  07/24/19 0241 07/24/19 1506 07/25/19 0008  HGB 9.1*  --  9.3*  --  7.6*  HCT 28.4*  --  28.1*  --  22.9*  PLT 202  --  177  --  156  HEPARINUNFRC 0.27*   < > 0.24* 0.13* 0.25*  CREATININE 5.99*  --  5.70*  --   --    < > = values in this interval not displayed.    Estimated Creatinine Clearance: 14 mL/min (A) (by C-G formula based on SCr of 5.7 mg/dL (H)).  Assessment: 62 yom presenting with SOB, elevated troponin. Pharmacy consulted to dose heparin for ACS. Pt is not on anticoagulation PTA. Hg 10.5, plt wnl. Noted hx of GIB documented in April 2020, but no current active bleed issues documented.  Heparin level came back slightly subtherapeutic at 0.25. Hgb dropped today to 7.6 - will monitor closely, plt stable. No s/sx of bleeding or infusion issues.   Goal of Therapy:  Heparin level 0.3-0.7 units/ml Monitor platelets by anticoagulation protocol: Yes   Plan:  Increase heparin to 2550 units/hr Recheck heparin level in 8 hours Monitor heparin level, CBC, and for s/sx of bleeding   Antonietta Jewel, PharmD, BCCCP Clinical Pharmacist  Phone: [phone]  Please check AMION for all [place] phone numbers After 10:00 PM, call [place] [phone]

## 2019-07-25 NOTE — Progress Notes (Signed)
[name], male   DOB: 01/28/1951, 68 y.o.   MRN: [id number] [facility] Progress Note   Assessment/ Plan:   1.  Acute exacerbation of diastolic heart failure:  Secondary to recent cocaine use and indiscretions with sodium/fluid intake. Now on hemodialysis and anticipate he may be able to tolerate coronary angiogram soon. 2.  Chronic kidney disease stage V-now progression to end-stage renal disease: Started yesterday on HD via temp HD catheter after infiltrated L RCF. Ongoing evaluation by [facility] to assess for angioplasty of RCF vs new access placement and will need conversion of temp cath to [facility] prior to DC.  3.  Anemia of chronic kidney disease: Denies overt loss and recent records significant for GI bleeding with AVMs.  Resume IV Fe/ESA. 4.  Secondary hyperparathyroidism: Calcium and phosphorus levels currently acceptable, continue calcitriol for PTH suppression. 5.  HIV infection: Resume/continue antiretroviral therapy. 6.  Polysubstance abuse: Discussed need for cessation  Subjective:   Reports intermittent back pain and general discomfort at dialysis.   Objective:   BP 118/66   Pulse 89   Temp 99.8 F (37.7 C) (Oral)   Resp 20   Ht 6\' 2"  (1.88 m)   Wt 89 kg   SpO2 95%   BMI 25.19 kg/m   Intake/Output Summary (Last 24 hours) at 07/25/2019 6063 Last data filed at 07/24/2019 1822 Gross per 24 hour  Intake 222.14 ml  Output 600 ml  Net -377.86 ml   Weight change: 1.786 kg  Physical Exam: Gen: Uncomfortable resting in dialysis, just finished using urinal CVS: Pulse regular rhythm and normal rate, S1 and S2 normal Resp: Fine rales over both lung fields, no rhonchi Abd: Soft, obese, mildly tender over epigastric area Ext: Trace lower extremity edema, left RCF with palpable thrill  Imaging: Dg Abd 1 View  Result Date: 07/23/2019 CLINICAL DATA:  Persistent abdominal pain EXAM: ABDOMEN - 1 VIEW COMPARISON:  CT 11/03/2015 FINDINGS: Vascular  calcifications in the pelvis. Prior cholecystectomy. No bowel obstruction, free air, or organomegaly. IMPRESSION: No acute findings. Electronically Signed   By: Rolm Baptise M.D.   On: 07/23/2019 11:17   US Abdomen Complete  Result Date: 07/23/2019 CLINICAL DATA:  Abdominal distension. EXAM: ABDOMEN ULTRASOUND COMPLETE COMPARISON:  CT scan of November 03, 2015. FINDINGS: Gallbladder: Status post cholecystectomy. Common bile duct: Diameter: Not visualized, but no definite dilatation is noted. Liver: No focal lesion identified. Increased echogenicity of hepatic parenchyma is noted. Portal vein is patent on color Doppler imaging with normal direction of blood flow towards the liver. IVC: No abnormality visualized. Pancreas: Not visualized due to overlying bowel gas. Spleen: Size and appearance within normal limits. Right Kidney: Length: 12.1 cm. 1 cm cyst is noted in lower pole. Increased echogenicity of renal parenchyma is noted. No mass or hydronephrosis visualized. Left Kidney: Length: 12.9 cm. 8 mm cyst is noted in midpole. Increased echogenicity of renal parenchyma is noted. No mass or hydronephrosis visualized. Abdominal aorta: No aneurysm visualized. Other findings: None. IMPRESSION: Increased echogenicity of renal parenchyma is noted suggesting hepatic steatosis. Increased echogenicity of renal parenchyma is noted bilaterally suggesting medical renal disease. No hydronephrosis or renal obstruction is noted. Small bilateral simple renal cysts are noted. Pancreas not visualized due to overlying bowel gas. Status post cholecystectomy. Electronically Signed   By: Marijo Conception M.D.   On: 07/23/2019 15:24   Ir Fluoro Guide Cv Line Right  Result Date: 07/24/2019 INDICATION: End-stage renal disease requiring dialysis. EXAM: FLUOROSCOPIC AND  ULTRASOUND GUIDED PLACEMENT OF A NON-TUNNELED DIALYSIS CATHETER MEDICATIONS: None ANESTHESIA/SEDATION: Local anesthetic only FLUOROSCOPY TIME:  Fluoroscopy Time: 0 minutes  18 seconds (2 mGy). COMPLICATIONS: None immediate. PROCEDURE: Informed consent was obtained for catheter placement. The patient was placed supine on the interventional table. Ultrasound confirmed a patent right internal jugular vein. Ultrasound images were obtained for documentation. The right neck was prepped and draped in a sterile fashion. The right neck was anesthetized with 1% lidocaine. Maximal barrier sterile technique was utilized including caps, mask, sterile gowns, sterile gloves, sterile drape, hand hygiene and skin antiseptic. A small incision was made with #11 blade scalpel. A 21 gauge needle directed into the right internal jugular vein with ultrasound guidance. A micropuncture dilator set was placed. A 16 cm Mahurkar catheter was selected. The catheter was advanced over a wire and positioned at the superior cavoatrial junction. Fluoroscopic images were obtained for documentation. Both dialysis lumens were found to aspirate and flush well. The proper amount of heparin was flushed in both lumens. The central venous lumen was flushed with normal saline. Catheter was sutured to skin. FINDINGS: Catheter tip at the superior cavoatrial junction. IMPRESSION: Successful placement of a 16 cm non-tunneled dialysis catheter using ultrasound and fluoroscopic guidance. The catheter is ready for immediate use as clinically indicated. Electronically Signed   By: Constance Holster M.D.   On: 07/24/2019 13:41   Ir US Guide Vasc Access Right  Result Date: 07/24/2019 INDICATION: End-stage renal disease requiring dialysis. EXAM: FLUOROSCOPIC AND ULTRASOUND GUIDED PLACEMENT OF A NON-TUNNELED DIALYSIS CATHETER MEDICATIONS: None ANESTHESIA/SEDATION: Local anesthetic only FLUOROSCOPY TIME:  Fluoroscopy Time: 0 minutes 18 seconds (2 mGy). COMPLICATIONS: None immediate. PROCEDURE: Informed consent was obtained for catheter placement. The patient was placed supine on the interventional table. Ultrasound confirmed a patent  right internal jugular vein. Ultrasound images were obtained for documentation. The right neck was prepped and draped in a sterile fashion. The right neck was anesthetized with 1% lidocaine. Maximal barrier sterile technique was utilized including caps, mask, sterile gowns, sterile gloves, sterile drape, hand hygiene and skin antiseptic. A small incision was made with #11 blade scalpel. A 21 gauge needle directed into the right internal jugular vein with ultrasound guidance. A micropuncture dilator set was placed. A 16 cm Mahurkar catheter was selected. The catheter was advanced over a wire and positioned at the superior cavoatrial junction. Fluoroscopic images were obtained for documentation. Both dialysis lumens were found to aspirate and flush well. The proper amount of heparin was flushed in both lumens. The central venous lumen was flushed with normal saline. Catheter was sutured to skin. FINDINGS: Catheter tip at the superior cavoatrial junction. IMPRESSION: Successful placement of a 16 cm non-tunneled dialysis catheter using ultrasound and fluoroscopic guidance. The catheter is ready for immediate use as clinically indicated. Electronically Signed   By: Constance Holster M.D.   On: 07/24/2019 13:41    Labs: BMET Recent Labs  Lab 07/21/19 0928 07/21/19 0943 07/21/19 1821 07/21/19 2330 07/23/19 0608 07/24/19 0241 07/25/19 0008  NA 140 141 135 135 138 136 138  K 4.5 4.6 4.4 4.2 4.5 4.6 4.3  CL 108 109 104 104 106 104 103  CO2 23  --  21* 20* 22 17* 22  GLUCOSE 110* 100* 152* 149* 128* 147* 147*  BUN 72* 68* 73* 74* 80* 92* 77*  CREATININE 5.55* 5.60* 5.71* 5.79* 5.99* 5.70* 5.42*  CALCIUM 9.0  --  8.6* 8.6* 9.3 9.2 8.8*  PHOS 5.1*  --  4.5 4.5  --  3.7 4.6   CBC Recent Labs  Lab 07/21/19 0928  07/22/19 1645 07/23/19 0608 07/24/19 0241 07/25/19 0008  WBC 7.2   < > 7.9 8.7 10.2 8.1  NEUTROABS 5.6  --   --   --   --   --   HGB 9.7*   < > 8.5* 9.1* 9.3* 7.6*  HCT 28.9*   < > 25.3*  28.4* 28.1* 22.9*  MCV 100.3*   < > 99.2 100.7* 100.7* 100.9*  PLT 200   < > 182 202 177 156   < > = values in this interval not displayed.   Medications:    . allopurinol  100 mg Oral Daily  . aspirin EC  81 mg Oral Daily  . atorvastatin  80 mg Oral Daily  . bictegravir-emtricitabine-tenofovir AF  1 tablet Oral QPM  . carvedilol  25 mg Oral BID WC  . Chlorhexidine Gluconate Cloth  6 each Topical Q0600  . doxycycline  100 mg Oral Q12H  . gabapentin  300 mg Oral QHS  . heparin injection (subcutaneous)  5,000 Units Subcutaneous Q8H  . hydrALAZINE  25 mg Oral TID  . insulin aspart  0-9 Units Subcutaneous TID WC  . isosorbide mononitrate  30 mg Oral Daily  . umeclidinium-vilanterol  1 puff Inhalation Daily   Elmarie Shiley, MD 07/25/2019, 9:04 AM

## 2019-07-25 NOTE — Progress Notes (Signed)
Pharmacy Antibiotic Note  Jacob [name] is a 68 y.o. male admitted on 07/21/2019 with Pneumonia. Patient not improving on CTX and doxycycline.   Pharmacy has been consulted for vancomycin and cefepime dosing. Patient is a new ESRD patient who has received HD for the last 2 days with low BF and short sessions (2 and 2.5 hours).Not sure what patient's HD schedule will be.    Plan: Cefepime 1gm IV q24h Vancomycin 1750mg  IV x 1, then as needed until on a set dialysis schedule.  Levels as needed.  F/u clinical course, deescalation and LOT  Height: 6\' 2"  (188 cm) Weight: 196 lb 3.4 oz (89 kg) IBW/kg (Calculated) : 82.2  Temp (24hrs), Avg:99.3 F (37.4 C), Min:97.6 F (36.4 C), Max:101.3 F (38.5 C)  Recent Labs  Lab 07/21/19 1821 07/21/19 2330 07/22/19 1645 07/23/19 0608 07/24/19 0241 07/25/19 0008 07/25/19 1421  WBC  --  6.1 7.9 8.7 10.2 8.1 6.8  CREATININE 5.71* 5.79*  --  5.99* 5.70* 5.42*  --     Estimated Creatinine Clearance: 14.7 mL/min (A) (by C-G formula based on SCr of 5.42 mg/dL (H)).    Allergies  Allergen Reactions  . Augmentin [Amoxicillin-Pot Clavulanate] Diarrhea    Severe diarrhea   . Amphetamines Other (See Comments)    Unknown reaction type    Jacob [name] A. Levada Dy, PharmD, BCPS, FNKF Clinical Pharmacist [place] Please utilize Amion for appropriate phone number to reach the unit pharmacist ([place])   07/25/2019 3:11 PM

## 2019-07-25 NOTE — Progress Notes (Signed)
S) Jacob [name] was seen at bedside fiber measurement that showed a temperature of 100.5.  He reports feeling significantly more fatigued compared to this morning.  He denies any new difficulty breathing or shortness of breath.  He also denies nausea, vomiting, chest pain, dysuria.  His last void was before lunch he does not recall any difficulty or pain with urination.  O) BP 116/60 (BP Location: Right Arm)   Pulse 92   Temp (!) 100.5 F (38.1 C) (Oral)   Resp (!) 22   Ht 6\' 2"  (1.88 m)   Wt 89 kg   SpO2 100%   BMI 25.19 kg/m   General: Ill-appearing 68-year-old gentleman lying in bed.  Clearly fatigued though alert and oriented. HEENT: Mildly dry mucous membranes. Respiratory: Rhonchorous breath sounds noted bilaterally more significant in right lung fields.  Very mild wheezing noted bilaterally. Cardiac: Regular rate and rhythm.  Palpable thrill noted at left radial pulse and left forearm. Extremities: No significant lower extremity edema.  Extremities warm, well-perfused.  A/P) Jacob [name] is a 68 year old gentleman previous medical history of HIV, CAP, recent end STEMI notified of fever up to 100.5.  Physical exam is most remarkable for rhonchorous breath sounds and wheezing noted primarily in the right lung fields.  Review shows that his last fever for today was noted on 9/30 1608 and earlier in the day at 0700.  His fevers are clearly preceding insertion of his temporary catheter.  Think there is no reason to remove the catheter at this time.  Most likely related to a poorly covered pneumonia. -Chest x-ray -Blood cultures -Urine culture - plan to broaden to vanc and cefepime

## 2019-07-26 DIAGNOSIS — R69 Illness, unspecified: Secondary | ICD-10-CM

## 2019-07-26 DIAGNOSIS — R140 Abdominal distension (gaseous): Secondary | ICD-10-CM

## 2019-07-26 LAB — BASIC METABOLIC PANEL
Anion gap: 14 (ref 5–15)
BUN: 78 mg/dL — ABNORMAL HIGH (ref 8–23)
CO2: 23 mmol/L (ref 22–32)
Calcium: 8.4 mg/dL — ABNORMAL LOW (ref 8.9–10.3)
Chloride: 98 mmol/L (ref 98–111)
Creatinine, Ser: 6.45 mg/dL — ABNORMAL HIGH (ref 0.61–1.24)
GFR calc Af Amer: 9 mL/min — ABNORMAL LOW (ref >60)
GFR calc non Af Amer: 8 mL/min — ABNORMAL LOW (ref >60)
Glucose, Bld: 152 mg/dL — ABNORMAL HIGH (ref 70–99)
Potassium: 4.1 mmol/L (ref 3.5–5.1)
Sodium: 135 mmol/L (ref 135–145)

## 2019-07-26 LAB — IRON AND TIBC
Iron: 10 ug/dL — ABNORMAL LOW (ref 45–182)
Saturation Ratios: 4 % — ABNORMAL LOW (ref 17.9–39.5)
TIBC: 283 ug/dL (ref 250–450)
UIBC: 273 ug/dL

## 2019-07-26 LAB — GLUCOSE, CAPILLARY
Glucose-Capillary: 152 mg/dL — ABNORMAL HIGH (ref 70–99)
Glucose-Capillary: 269 mg/dL — ABNORMAL HIGH (ref 70–99)
Glucose-Capillary: 139 mg/dL — ABNORMAL HIGH (ref 70–99)
Glucose-Capillary: 261 mg/dL — ABNORMAL HIGH (ref 70–99)

## 2019-07-26 LAB — OCCULT BLOOD X 1 CARD TO LAB, STOOL: Fecal Occult Bld: POSITIVE — AB

## 2019-07-26 LAB — FERRITIN: Ferritin: 314 ng/mL (ref 24–336)

## 2019-07-26 LAB — HEMOGLOBIN AND HEMATOCRIT, BLOOD
HCT: 24.9 % — ABNORMAL LOW (ref 39.0–52.0)
Hemoglobin: 8 g/dL — ABNORMAL LOW (ref 13.0–17.0)

## 2019-07-26 MED ORDER — DARBEPOETIN ALFA 60 MCG/0.3ML IJ SOSY: 60.0000 ug | PREFILLED_SYRINGE | INTRAMUSCULAR | Status: DC

## 2019-07-26 MED ORDER — ALBUTEROL SULFATE HFA 108 (90 BASE) MCG/ACT IN AERS
2.0000 | INHALATION_SPRAY | RESPIRATORY_TRACT | Status: DC | PRN
  Filled 2019-07-26: qty 6.7

## 2019-07-26 MED ORDER — DARBEPOETIN ALFA 60 MCG/0.3ML IJ SOSY
PREFILLED_SYRINGE | INTRAMUSCULAR | Status: AC
  Filled 2019-07-26: qty 0.3

## 2019-07-26 MED ORDER — HEPARIN SODIUM (PORCINE) 1000 UNIT/ML IJ SOLN
INTRAMUSCULAR | Status: AC
  Filled 2019-07-26: qty 4

## 2019-07-26 MED ORDER — CALCITRIOL 0.25 MCG PO CAPS
ORAL_CAPSULE | ORAL | Status: AC
  Administered 2019-07-26: 0.25 ug via ORAL
  Filled 2019-07-26: qty 1

## 2019-07-26 MED ORDER — ALBUTEROL SULFATE HFA 108 (90 BASE) MCG/ACT IN AERS: 2.0000 | INHALATION_SPRAY | RESPIRATORY_TRACT | Status: DC | PRN

## 2019-07-26 MED ORDER — CALCITRIOL 0.25 MCG PO CAPS
0.2500 ug | ORAL_CAPSULE | Freq: Every day | ORAL | Status: DC
  Administered 2019-07-26 – 2019-08-01 (×6): 0.25 ug via ORAL
  Filled 2019-07-26 (×7): qty 1

## 2019-07-26 MED ORDER — VANCOMYCIN HCL IN DEXTROSE 750-5 MG/150ML-% IV SOLN
750.0000 mg | Freq: Once | INTRAVENOUS | Status: AC
  Administered 2019-07-26: 750 mg via INTRAVENOUS
  Filled 2019-07-26: qty 150

## 2019-07-26 MED ORDER — SODIUM CHLORIDE 0.9 % IV SOLN
125.0000 mg | INTRAVENOUS | Status: DC
  Administered 2019-07-26 – 2019-07-31 (×2): 125 mg via INTRAVENOUS
  Filled 2019-07-26 (×6): qty 10

## 2019-07-26 NOTE — Progress Notes (Signed)
Dr. [name] does not have appt availability in time requested so per d/w Dr. Burt Knack, [place] to schedule with APP (in person) - have done so with Serita Butcher NP on 10/28, appt info added to AVS. Melina Copa PA-C

## 2019-07-26 NOTE — Progress Notes (Signed)
Patient has been accepted for OP HD treatment at [facility] on a MWF schedule with a seat time of 1:20pm. He needs to arrive to his appointments at 1:00pm. On his first day of treatment, he needs to arrive at 12:15pm to complete paperwork prior to his first treatment.  Renal Navigator notified Nephrologist/Dr. Posey Pronto and will meet with patient to inform verbally and in writing.  Alphonzo Cruise, [place] Renal Navigator [phone]

## 2019-07-26 NOTE — Progress Notes (Signed)
Family Medicine Teaching Service Daily Progress Note Intern Pager: [phone]  Patient name: Jacob [name] Medical record number: [id number] Date of birth: Jan 31, 1951 Age: 68 y.o. Gender: male  Primary Care Provider: [name], NP Consultants: Cardiology, Nephrology Code Status: Full  Pt Overview and Major Events to Date:  9/27- admitted, NSTEMI 9/29-unable to cannulate AV fistula 9/30-plan to go to IR for temporary catheter for HD  Assessment and Plan: Jacob [name] is a 68 y.o. male presenting with dyspnea, chest pain. PMH is significant for CHF, CAD, cardiomyopathy, HIV, DMT2, substance use disorder.  NSTEMI  Atypical Chest pain, resolved  H/o CAD s/p CABG 2004 Patient currently denies chest pain.  Will follow up with cardiology re: plans for further workup outpatient after resolution of this hospitalization. Cards appointment scheduled for 10/28.  Home meds include high intensity statin. - continue home meds - sub-q heparin for dvt ppx - nephrology on board  Acute Hypoxic Respiratory Failure likely 2/2 CHF exacerbation  HFrEF (EF 40-45%) versus CAP: Patient with repeat fever 10/1 at 20:00 of 100.8 Fahrenheit.  Due to concern for CAP on 9/29 due to new cough, chest x-ray with edema versus infiltrates, patient was started on ceftriaxone and doxycycline (we did not use azithromycin due to patient's prolonged QT of 533 on 9/28). Patient currently on 3L nasal cannula.  He does not have a home oxygen requirement.  He did not receive Lasix as he has resistance to it.  KUB negative for SBO, abdominal ultrasound negative for ascites and free fluid, positive for echogenic liver-most likely hepatic steatosis.  Home meds are Coreg 25 mg BID, Lasix 160 mg TID, Imdur 30 mg, nitroglycerin. - strict I/O's, daily weights - Continuous pulse ox - O2 supplementation, maintain oxygenation >88% via [place] - AM CBC, CMP  - hold home meds  Concern for CAP Patient with repeat fever 10/1 at 20:00  of 100.8 Fahrenheit. Patient with new onset cough since 9/29, repeat chest x-ray shows possible infiltrate versus pulmonary edema similar to previous CXR. Patient does have known HFrEF of 40 to 45% with recent NSTEMI, however due to fevers we will cover for CAP.   -Patient status post doxycycline and ceftriaxone (9/28-9/30) -Patient began cefepime and vancomycin (10/1-  ) -Monitor fever curve - if repeat fever today or later redraw cultures including from cath.  - albuterol prn  ESRD now on HD Patient had temporary catheter placed 9/30 to receive HD, had two short bouts of HD on 9/30, 10/1.  VVS to perform doppler of AV fistula to assess long-term access. CKD Stage V since 02/2018.  AV fistula placed in October 2019 by Dr. Monica Martinez, VVS. Follows with Dr. [name], Nephrology, for anemia of chronic disease.  - monitor with daily BMP - nephology consult  Anemia of Chronic Disease  H/O GI Bleed (01/2019)- ?worsening Hemoglobin 10/2 8.0 s/p 1 unit rbc.  No signs of bleeding on exam and patient denies blood in stool or dark stools.  Patient hospitalized for acute GI bleed in April 2020.  - Patient received 1 unit RBCs on 10/1 - heparin drip stopped, transition to sub-q tonight - Transfusion threshold 8 - Iron studies were performed after patient received his transfusion making them somewhat inaccurate, however he still noted to have a low iron level.  Patient likely would benefit from colonoscopy as an outpatient  Polysubstance Use Disorder Last reported intermittent cocaine use, last used Saturday. Smokes 1PPD x >45 years. Drinks ~6-32oz beers on weekends. Last drink 9/26.  UDS reveals positive for opiates as well, no history of opiates given this admission or by EMS. - Nicotine patch PRN - encourage cessation of drug use  Elevated LFTs-resolved Patient reported that he drank six 32 ounce beers over the weekend,  likely due to alcohol intake. -stable  T2DM: Home meds include: Toujeo  88U QD and Humalog 20U TID. Overnight BGs in 150s-160s. Sensitive SSI appropriate for now, will continue to monitor. - continue sensitive SSI - BG qAC, qHS, qAM fasting  COPD Has 45-pack-year smoking history.  He smokes a pack per day.  Home medication is Incruse Ellipta. -Continue home medications  HTN Blood pressures elevated on admission: 170-190's/90's.  Overnight BP range from 97-117/55-88.  This morning blood pressure 130/85.  Home medications include hydralazine 3 times daily, Coreg.  -Continue hydralazine and coreg  HIV Home med is Biktarvy. HIV-1 RNA quantification was not detectable on 06/24/2019. He follows with Dr. [name] at [facility]. Last appt 07/08/2019. Good control. -Continue home meds  Gout Home meds include colchicine and allopurinol. - hold colchicine - continue allopurinol  HLD Home meds are high intensity statin. -Continue atorvastatin 80 mg  FEN/GI: heart healthy/carb modified/renal diet PPx: Sub-q heparin for ppx  Disposition: to cardiac telemetry pending further work up and stabilization  Subjective:  Patient states he is feeling somewhat better today after receiving his blood transfusion last night.  He does admit to about 2 bouts of diarrhea earlier today and approximately 2 bouts yesterday.  Patient states that the plan is for him to have repeat dialysis possibly tomorrow.  Objective: Temp:  [97.8 F (36.6 C)-100.8 F (38.2 C)] 98.9 F (37.2 C) (10/02 1220) Pulse Rate:  [75-92] 80 (10/02 1400) Resp:  [16-29] 23 (10/02 1330) BP: (97-130)/(53-88) 129/59 (10/02 1400) SpO2:  [81 %-99 %] 95 % (10/02 1220) Weight:  [87.7 kg-91.2 kg] 91.2 kg (10/02 1220)   Physical Exam: General: Alert and oriented in no apparent distress, nasal cannula in place Heart: Regular rate and rhythm with no murmurs appreciated Lungs: Rhonchi noted worse on the right compared to the left.  Somewhat improved from yesterday.  Patient on 3 L oxygen via nasal cannula Abdomen:  Bowel sounds present, no abdominal pain Skin: Warm and dry Extremities: No lower extremity edema   Laboratory: Recent Labs  Lab 07/24/19 0241 07/25/19 0008 07/25/19 1421 07/26/19 0610  WBC 10.2 8.1 6.8  --   HGB 9.3* 7.6* 7.5* 8.0*  HCT 28.1* 22.9* 23.3* 24.9*  PLT 177 156 164  --    Recent Labs  Lab 07/21/19 0928  07/23/19 0608 07/24/19 0241 07/25/19 0008 07/26/19 0610  NA 140   < > 138 136 138 135  K 4.5   < > 4.5 4.6 4.3 4.1  CL 108   < > 106 104 103 98  CO2 23   < > 22 17* 22 23  BUN 72*   < > 80* 92* 77* 78*  CREATININE 5.55*   < > 5.99* 5.70* 5.42* 6.45*  CALCIUM 9.0   < > 9.3 9.2 8.8* 8.4*  PROT 7.7  --  7.6  --   --   --   BILITOT 0.7  --  1.2  --   --   --   ALKPHOS 42  --  43  --   --   --   ALT 51*  --  34  --   --   --   AST 95*  --  47*  --   --   --  GLUCOSE 110*   < > 128* 147* 147* 152*   < > = values in this interval not displayed.    Imaging/Diagnostic Tests: Dg Chest 2 View  Result Date: 07/25/2019 CLINICAL DATA:  Chest pain shortness of breath. EXAM: CHEST - 2 VIEW COMPARISON:  07/22/2019 FINDINGS: Heart size is enlarged following median sternotomy and CABG. Aortic atherosclerosis is demonstrated. Placement of right IJ catheter since previous exam. Bilateral interstitial prominence patchy opacities similar to prior study. Signs of remote trauma to right ribs as before. IMPRESSION: Interval placement of right IJ dialysis catheter. No signs of pneumothorax. Signs of pulmonary edema with patchy opacities, superimposed infection not excluded. Electronically Signed   By: Zetta Bills M.D.   On: 07/25/2019 15:45    Lurline Del, DO 07/26/2019, 2:20 PM PGY-1, [place] Intern pager: [phone], text pages welcome

## 2019-07-26 NOTE — Progress Notes (Signed)
[name], male   DOB: [birth year], 68 y.o.   MRN: [id number] [facility] Progress Note   Assessment/ Plan:   1.  Acute exacerbation of diastolic heart failure:  Secondary to recent cocaine use and indiscretions with sodium/fluid intake. Now on hemodialysis and with plans for coronary angiography as an outpatient following stabilization of volume status. 2.  Chronic kidney disease stage V-now progression to end-stage renal disease:  Currently on hemodialysis via temporary IJ dialysis catheter after infiltration of his left radiocephalic fistula. Ongoing evaluation by VVS to assess for angioplasty of RCF vs new access placement and will need conversion of temp cath to [facility] prior to DC.  Process initiated for outpatient dialysis unit placement. 3.  Anemia of chronic kidney disease: Denies overt loss and recent records significant for GI bleeding with AVMs.  Resume IV Fe/ESA. 4.  Secondary hyperparathyroidism: Calcium and phosphorus levels currently acceptable, continue calcitriol for PTH suppression. 5.  HIV infection: Resume/continue antiretroviral therapy. 6.  Polysubstance abuse: Encouraged efforts at cessation of cocaine use. 7.  Fever: Suspected to be a respiratory origin with pneumonia-antibiotic adjustments per primary service.  Subjective:   Reports that he is feeling somewhat better, still having intermittent shortness of breath.  His main complaint is that he will experience a delay in paying his bills because we are still keeping him in the hospital.   Objective:   BP 130/85 (BP Location: Right Arm)   Pulse 80   Temp 98 F (36.7 C) (Oral)   Resp 18   Ht 6\' 2"  (1.88 m)   Wt 87.7 kg   SpO2 95%   BMI 24.82 kg/m   Intake/Output Summary (Last 24 hours) at 07/26/2019 0757 Last data filed at 07/26/2019 0600 Gross per 24 hour  Intake 1146 ml  Output 1250 ml  Net -104 ml   Weight change: -1.6 kg  Physical Exam: Gen: Appears to be resting comfortably in  bed-sleeping on right side CVS: Pulse regular rhythm and normal rate, S1 and S2 normal Resp: Decreased breath sounds right base otherwise clear to auscultation, no rales or rhonchi Abd: Soft, obese, mildly tender over epigastric area Ext: Trace lower extremity edema, left RCF with palpable thrill  Imaging: Dg Chest 2 View  Result Date: 07/25/2019 CLINICAL DATA:  Chest pain shortness of breath. EXAM: CHEST - 2 VIEW COMPARISON:  07/22/2019 FINDINGS: Heart size is enlarged following median sternotomy and CABG. Aortic atherosclerosis is demonstrated. Placement of right IJ catheter since previous exam. Bilateral interstitial prominence patchy opacities similar to prior study. Signs of remote trauma to right ribs as before. IMPRESSION: Interval placement of right IJ dialysis catheter. No signs of pneumothorax. Signs of pulmonary edema with patchy opacities, superimposed infection not excluded. Electronically Signed   By: Zetta Bills M.D.   On: 07/25/2019 15:45   Ir Fluoro Guide Cv Line Right  Result Date: 07/24/2019 INDICATION: End-stage renal disease requiring dialysis. EXAM: FLUOROSCOPIC AND ULTRASOUND GUIDED PLACEMENT OF A NON-TUNNELED DIALYSIS CATHETER MEDICATIONS: None ANESTHESIA/SEDATION: Local anesthetic only FLUOROSCOPY TIME:  Fluoroscopy Time: 0 minutes 18 seconds (2 mGy). COMPLICATIONS: None immediate. PROCEDURE: Informed consent was obtained for catheter placement. The patient was placed supine on the interventional table. Ultrasound confirmed a patent right internal jugular vein. Ultrasound images were obtained for documentation. The right neck was prepped and draped in a sterile fashion. The right neck was anesthetized with 1% lidocaine. Maximal barrier sterile technique was utilized including caps, mask, sterile gowns, sterile gloves, sterile drape, hand hygiene  and skin antiseptic. A small incision was made with #11 blade scalpel. A 21 gauge needle directed into the right internal jugular  vein with ultrasound guidance. A micropuncture dilator set was placed. A 16 cm Mahurkar catheter was selected. The catheter was advanced over a wire and positioned at the superior cavoatrial junction. Fluoroscopic images were obtained for documentation. Both dialysis lumens were found to aspirate and flush well. The proper amount of heparin was flushed in both lumens. The central venous lumen was flushed with normal saline. Catheter was sutured to skin. FINDINGS: Catheter tip at the superior cavoatrial junction. IMPRESSION: Successful placement of a 16 cm non-tunneled dialysis catheter using ultrasound and fluoroscopic guidance. The catheter is ready for immediate use as clinically indicated. Electronically Signed   By: Constance Holster M.D.   On: 07/24/2019 13:41   Ir US Guide Vasc Access Right  Result Date: 07/24/2019 INDICATION: End-stage renal disease requiring dialysis. EXAM: FLUOROSCOPIC AND ULTRASOUND GUIDED PLACEMENT OF A NON-TUNNELED DIALYSIS CATHETER MEDICATIONS: None ANESTHESIA/SEDATION: Local anesthetic only FLUOROSCOPY TIME:  Fluoroscopy Time: 0 minutes 18 seconds (2 mGy). COMPLICATIONS: None immediate. PROCEDURE: Informed consent was obtained for catheter placement. The patient was placed supine on the interventional table. Ultrasound confirmed a patent right internal jugular vein. Ultrasound images were obtained for documentation. The right neck was prepped and draped in a sterile fashion. The right neck was anesthetized with 1% lidocaine. Maximal barrier sterile technique was utilized including caps, mask, sterile gowns, sterile gloves, sterile drape, hand hygiene and skin antiseptic. A small incision was made with #11 blade scalpel. A 21 gauge needle directed into the right internal jugular vein with ultrasound guidance. A micropuncture dilator set was placed. A 16 cm Mahurkar catheter was selected. The catheter was advanced over a wire and positioned at the superior cavoatrial junction.  Fluoroscopic images were obtained for documentation. Both dialysis lumens were found to aspirate and flush well. The proper amount of heparin was flushed in both lumens. The central venous lumen was flushed with normal saline. Catheter was sutured to skin. FINDINGS: Catheter tip at the superior cavoatrial junction. IMPRESSION: Successful placement of a 16 cm non-tunneled dialysis catheter using ultrasound and fluoroscopic guidance. The catheter is ready for immediate use as clinically indicated. Electronically Signed   By: Constance Holster M.D.   On: 07/24/2019 13:41    Labs: BMET Recent Labs  Lab 07/21/19 7169 07/21/19 6789 07/21/19 1821 07/21/19 2330 07/23/19 0608 07/24/19 0241 07/25/19 0008 07/26/19 0610  NA 140 141 135 135 138 136 138 135  K 4.5 4.6 4.4 4.2 4.5 4.6 4.3 4.1  CL 108 109 104 104 106 104 103 98  CO2 23  --  21* 20* 22 17* 22 23  GLUCOSE 110* 100* 152* 149* 128* 147* 147* 152*  BUN 72* 68* 73* 74* 80* 92* 77* 78*  CREATININE 5.55* 5.60* 5.71* 5.79* 5.99* 5.70* 5.42* 6.45*  CALCIUM 9.0  --  8.6* 8.6* 9.3 9.2 8.8* 8.4*  PHOS 5.1*  --  4.5 4.5  --  3.7 4.6  --    CBC Recent Labs  Lab 07/21/19 0928  07/23/19 0608 07/24/19 0241 07/25/19 0008 07/25/19 1421 07/26/19 0610  WBC 7.2   < > 8.7 10.2 8.1 6.8  --   NEUTROABS 5.6  --   --   --   --   --   --   HGB 9.7*   < > 9.1* 9.3* 7.6* 7.5* 8.0*  HCT 28.9*   < > 28.4* 28.1* 22.9*  23.3* 24.9*  MCV 100.3*   < > 100.7* 100.7* 100.9* 99.6  --   PLT 200   < > 202 177 156 164  --    < > = values in this interval not displayed.   Medications:    . allopurinol  100 mg Oral Daily  . aspirin EC  81 mg Oral Daily  . atorvastatin  80 mg Oral Daily  . bictegravir-emtricitabine-tenofovir AF  1 tablet Oral QPM  . carvedilol  25 mg Oral BID WC  . Chlorhexidine Gluconate Cloth  6 each Topical Q0600  . diclofenac sodium  2 g Topical QID  . gabapentin  300 mg Oral QHS  . heparin injection (subcutaneous)  5,000 Units  Subcutaneous Q8H  . hydrALAZINE  25 mg Oral TID  . insulin aspart  0-9 Units Subcutaneous TID WC  . isosorbide mononitrate  30 mg Oral Daily  . umeclidinium-vilanterol  1 puff Inhalation Daily  . vancomycin variable dose per unstable renal function (pharmacist dosing)   Does not apply See admin instructions   Elmarie Shiley, MD 07/26/2019, 7:57 AM

## 2019-07-26 NOTE — Progress Notes (Signed)
Plans as outlined yesterday. Ongoing medical issues/fever noted. Initiating hemodialysis. Will arrange OP follow-up with Dr [name] in 3-4 weeks and can make decision at that time based on symptoms whether he will need cardiac cath or ongoing med Rx for known CAD. Please call if any further cardiac issues arise. Med program reviewed and appropriate.  thx  Sherren Mocha 07/26/2019 7:43 AM

## 2019-07-26 NOTE — Progress Notes (Signed)
Renal Navigator met with patient at bedside to provide OP HD clinic information. Patient stated understanding and reports that he has no transportation to clinic. He states he has applied for [facility] in the past and has been denied. Renal Navigator thinks he will be approved now that he has started HD, but left message for [employer] Service Program Coordinator/C. Rorie to discuss. Renal Navigator also submitted new application for SCAT services. Renal Navigator is concerned that SCAT may not be offered to patient as soon as Monday, which will be his first appointment in the OP HD clinic if he is ready for discharge over the weekend. Renal Navigator discussed this with patient and he reports that the regular bus stop is too far away from his home to ride, he cannot afford a cab and that he has no one he can ask for a ride to the clinic. Renal Navigator will notify Nephrologist and CM of situation, but hopes to be able to discuss SCAT with Ms. Rorie today if she calls Renal Navigator back.  Alphonzo Cruise, [place] Renal Navigator [phone]

## 2019-07-26 NOTE — Progress Notes (Signed)
Renal Navigator received call back from C. Rorie/SCAT who states patient has been approved for [facility] transportation. He can start utilizing SCAT transportation for OP HD as soon as he needs, as long as rides are scheduled within 24 hours of his appointments. Renal Navigator notified Nephrologist. Renal Navigator attempted to update patient, but he was having an issue with bowels on HD at this time. Renal Navigator left a note for patient with SCAT reservation number or patient and explained that it is there for him.   Alphonzo Cruise, [place] Renal Navigator [phone]

## 2019-07-26 NOTE — Progress Notes (Signed)
Pharmacy Antibiotic Note  Jacob [name] is a 68 y.o. male admitted on 07/21/2019 with Pneumonia. Patient not improving on CTX and doxycycline.   Pharmacy has been consulted for vancomycin and cefepime dosing. Patient is a new ESRD patient who has received HD for the last 2 days with low BF and short sessions (2 and 2.5 hours).  10/2 Patient tolerated 3.5 hours of HD today.  Plan: Vanc 750 mg IV x 1 after dialysis today  Height: 6\' 2"  (188 cm) Weight: 201 lb 1 oz (91.2 kg) IBW/kg (Calculated) : 82.2  Temp (24hrs), Avg:98.7 F (37.1 C), Min:97.8 F (36.6 C), Max:100.8 F (38.2 C)  Recent Labs  Lab 07/21/19 2330 07/22/19 1645 07/23/19 0608 07/24/19 0241 07/25/19 0008 07/25/19 1421 07/26/19 0610  WBC 6.1 7.9 8.7 10.2 8.1 6.8  --   CREATININE 5.79*  --  5.99* 5.70* 5.42*  --  6.45*    Estimated Creatinine Clearance: 12.4 mL/min (A) (by C-G formula based on SCr of 6.45 mg/dL (H)).    Allergies  Allergen Reactions  . Augmentin [Amoxicillin-Pot Clavulanate] Diarrhea    Severe diarrhea   . Amphetamines Other (See Comments)    Unknown reaction type    Alanda Slim, PharmD, [facility] Clinical Pharmacist Please see AMION for all Pharmacists' Contact Phone Numbers 07/26/2019, 5:02 PM

## 2019-07-27 ENCOUNTER — Inpatient Hospital Stay (HOSPITAL_COMMUNITY): Payer: Medicare Other

## 2019-07-27 DIAGNOSIS — I770 Arteriovenous fistula, acquired: Secondary | ICD-10-CM

## 2019-07-27 LAB — CBC
HCT: 25.6 % — ABNORMAL LOW (ref 39.0–52.0)
Hemoglobin: 8.4 g/dL — ABNORMAL LOW (ref 13.0–17.0)
MCH: 31.8 pg (ref 26.0–34.0)
MCHC: 32.8 g/dL (ref 30.0–36.0)
MCV: 97 fL (ref 80.0–100.0)
Platelets: 147 10*3/uL — ABNORMAL LOW (ref 150–400)
RBC: 2.64 MIL/uL — ABNORMAL LOW (ref 4.22–5.81)
RDW: 14.8 % (ref 11.5–15.5)
WBC: 6.1 10*3/uL (ref 4.0–10.5)
nRBC: 0.5 % — ABNORMAL HIGH (ref 0.0–0.2)

## 2019-07-27 LAB — GLUCOSE, CAPILLARY
Glucose-Capillary: 191 mg/dL — ABNORMAL HIGH (ref 70–99)
Glucose-Capillary: 221 mg/dL — ABNORMAL HIGH (ref 70–99)
Glucose-Capillary: 162 mg/dL — ABNORMAL HIGH (ref 70–99)
Glucose-Capillary: 179 mg/dL — ABNORMAL HIGH (ref 70–99)

## 2019-07-27 LAB — BASIC METABOLIC PANEL
Anion gap: 14 (ref 5–15)
BUN: 57 mg/dL — ABNORMAL HIGH (ref 8–23)
CO2: 23 mmol/L (ref 22–32)
Calcium: 7.9 mg/dL — ABNORMAL LOW (ref 8.9–10.3)
Chloride: 90 mmol/L — ABNORMAL LOW (ref 98–111)
Creatinine, Ser: 5.29 mg/dL — ABNORMAL HIGH (ref 0.61–1.24)
GFR calc Af Amer: 12 mL/min — ABNORMAL LOW (ref >60)
GFR calc non Af Amer: 10 mL/min — ABNORMAL LOW (ref >60)
Glucose, Bld: 228 mg/dL — ABNORMAL HIGH (ref 70–99)
Potassium: 3.6 mmol/L (ref 3.5–5.1)
Sodium: 127 mmol/L — ABNORMAL LOW (ref 135–145)

## 2019-07-27 LAB — TYPE AND SCREEN
ABO/RH(D): A POS
Antibody Screen: NEGATIVE
Unit division: 0

## 2019-07-27 LAB — BPAM RBC
Blood Product Expiration Date: 202011012359
ISSUE DATE / TIME: 202010020040
Unit Type and Rh: 6200

## 2019-07-27 LAB — PARATHYROID HORMONE, INTACT (NO CA): PTH: 261 pg/mL — ABNORMAL HIGH (ref 15–65)

## 2019-07-27 MED ORDER — SALINE SPRAY 0.65 % NA SOLN
1.0000 | NASAL | Status: DC | PRN
  Filled 2019-07-27: qty 44

## 2019-07-27 MED ORDER — DIPHENHYDRAMINE HCL 25 MG PO CAPS: 25.0000 mg | ORAL_CAPSULE | Freq: Four times a day (QID) | ORAL | Status: DC | PRN

## 2019-07-27 MED ORDER — CEFDINIR 300 MG PO CAPS
300.0000 mg | ORAL_CAPSULE | ORAL | Status: AC
  Administered 2019-07-27 – 2019-07-31 (×3): 300 mg via ORAL
  Filled 2019-07-27 (×3): qty 1

## 2019-07-27 MED ORDER — PANTOPRAZOLE SODIUM 40 MG PO TBEC
40.0000 mg | DELAYED_RELEASE_TABLET | Freq: Every day | ORAL | Status: DC
  Administered 2019-07-27 – 2019-08-01 (×5): 40 mg via ORAL
  Filled 2019-07-27 (×7): qty 1

## 2019-07-27 NOTE — Progress Notes (Signed)
md rounded in room, report pt conditions, no new order at this time, pt rest in bed, continue monitor

## 2019-07-27 NOTE — Progress Notes (Signed)
Pt oriented to self and time, refused to leave heart monitor on, not allow to place back on, notify provider, follow up new order

## 2019-07-27 NOTE — Progress Notes (Signed)
[name], male   DOB: 14-May-1951, 68 y.o.   MRN: [id number] [facility] Progress Note   Assessment/ Plan:   1.  Acute exacerbation of diastolic heart failure:  Secondary to recent cocaine use and indiscretions with sodium/fluid intake. Now on hemodialysis and with plans for coronary angiography as an outpatient following stabilization of volume status. 2.    End-stage renal disease from progressive chronic kidney disease stage V:  Started on hemodialysis after developing worsening volume overload refractory to outpatient diuretic therapy along with mild uremic symptoms.  Attempted to use his left RCF however infiltrated, got right IJ temporary catheter for dialysis placed by IR.  Consulted vascular surgery who were awaiting results of his Dopplers prior to planning on additional management/intervention.  His vascular access is the main barrier to his discharge at this time. 3.  Anemia of chronic kidney disease: Denies overt loss and recent records significant for GI bleeding with AVMs.    On IV iron/ESA started yesterday. 4.  Secondary hyperparathyroidism: Calcium and phosphorus levels currently acceptable, continue calcitriol for PTH suppression. 5.  HIV infection: Resume/continue antiretroviral therapy. 6.  Polysubstance abuse: Encouraged efforts at cessation of cocaine use. 7.  Fever: Suspected to be a respiratory origin with pneumonia-on intravenous vancomycin and cefepime per primary service.  Subjective:   Feeling somewhat better and continues to have intermittent abdominal pain (a chronic issue).  Wants to try and get out of the hospital before Monday so that he can go to outpatient dialysis and take care of his financial affairs.   Objective:   BP 126/68   Pulse 74   Temp 98.9 F (37.2 C) (Oral)   Resp 20   Ht 6\' 2"  (1.88 m)   Wt 86.5 kg   SpO2 93%   BMI 24.50 kg/m   Intake/Output Summary (Last 24 hours) at 07/27/2019 0925 Last data filed at  07/27/2019 0900 Gross per 24 hour  Intake 670 ml  Output 2100 ml  Net -1430 ml   Weight change: 2.2 kg  Physical Exam: Gen: Appears to be sitting comfortably up in bed CVS: Pulse regular rhythm and normal rate, S1 and S2 normal Resp: Clear to auscultation bilaterally without distinct rales or rhonchi Abd: Soft, obese, nontender, bowel sounds normal Ext: Trace lower extremity edema, left RCF with palpable thrill.  Right IJ temporary dialysis  Imaging: Dg Chest 2 View  Result Date: 07/25/2019 CLINICAL DATA:  Chest pain shortness of breath. EXAM: CHEST - 2 VIEW COMPARISON:  07/22/2019 FINDINGS: Heart size is enlarged following median sternotomy and CABG. Aortic atherosclerosis is demonstrated. Placement of right IJ catheter since previous exam. Bilateral interstitial prominence patchy opacities similar to prior study. Signs of remote trauma to right ribs as before. IMPRESSION: Interval placement of right IJ dialysis catheter. No signs of pneumothorax. Signs of pulmonary edema with patchy opacities, superimposed infection not excluded. Electronically Signed   By: Zetta Bills M.D.   On: 07/25/2019 15:45    Labs: BMET Recent Labs  Lab 07/21/19 8242  07/21/19 1821 07/21/19 2330 07/23/19 3536 07/24/19 0241 07/25/19 0008 07/26/19 0610 07/27/19 0322  NA 140   < > 135 135 138 136 138 135 127*  K 4.5   < > 4.4 4.2 4.5 4.6 4.3 4.1 3.6  CL 108   < > 104 104 106 104 103 98 90*  CO2 23  --  21* 20* 22 17* 22 23 23   GLUCOSE 110*   < > 152* 149* 128*  147* 147* 152* 228*  BUN 72*   < > 73* 74* 80* 92* 77* 78* 57*  CREATININE 5.55*   < > 5.71* 5.79* 5.99* 5.70* 5.42* 6.45* 5.29*  CALCIUM 9.0  --  8.6* 8.6* 9.3 9.2 8.8* 8.4* 7.9*  PHOS 5.1*  --  4.5 4.5  --  3.7 4.6  --   --    < > = values in this interval not displayed.   CBC Recent Labs  Lab 07/21/19 0928  07/24/19 0241 07/25/19 0008 07/25/19 1421 07/26/19 0610 07/27/19 0322  WBC 7.2   < > 10.2 8.1 6.8  --  6.1  NEUTROABS 5.6   --   --   --   --   --   --   HGB 9.7*   < > 9.3* 7.6* 7.5* 8.0* 8.4*  HCT 28.9*   < > 28.1* 22.9* 23.3* 24.9* 25.6*  MCV 100.3*   < > 100.7* 100.9* 99.6  --  97.0  PLT 200   < > 177 156 164  --  147*   < > = values in this interval not displayed.   Medications:    . allopurinol  100 mg Oral Daily  . aspirin EC  81 mg Oral Daily  . atorvastatin  80 mg Oral Daily  . bictegravir-emtricitabine-tenofovir AF  1 tablet Oral QPM  . calcitRIOL  0.25 mcg Oral Daily  . carvedilol  25 mg Oral BID WC  . Chlorhexidine Gluconate Cloth  6 each Topical Q0600  . [START ON 08/02/2019] darbepoetin (ARANESP) injection - DIALYSIS  60 mcg Intravenous Q Fri-HD  . diclofenac sodium  2 g Topical QID  . gabapentin  300 mg Oral QHS  . heparin injection (subcutaneous)  5,000 Units Subcutaneous Q8H  . hydrALAZINE  25 mg Oral TID  . insulin aspart  0-9 Units Subcutaneous TID WC  . isosorbide mononitrate  30 mg Oral Daily  . umeclidinium-vilanterol  1 puff Inhalation Daily  . vancomycin variable dose per unstable renal function (pharmacist dosing)   Does not apply See admin instructions   Elmarie Shiley, MD 07/27/2019, 9:25 AM

## 2019-07-27 NOTE — Progress Notes (Signed)
Family Medicine Teaching Service Daily Progress Note Intern Pager: [phone]  Patient name: Jacob [name] Medical record number: [id number] Date of birth: 09-20-1951 Age: 68 y.o. Gender: male  Primary Care Provider: [name], NP Consultants: Cardiology, Nephrology Code Status: Full  Pt Overview and Major Events to Date:  9/27- admitted, NSTEMI 9/29-unable to cannulate AV fistula 9/30-plan to go to IR for temporary catheter for HD  Antibiotics Ceftriaxone (9/29-9/30) Doxycycline 9/29-9/30) Vancomycin (10/1-?) Cefepime (10/1-?)  Assessment and Plan: Jacob [name] is a 68 y.o. male presenting with dyspnea, chest pain. PMH is significant for CHF, CAD, cardiomyopathy, HIV, DMT2, substance use disorder.  Community-acquired pneumonia Afebrile overnight.  Last fever noted on 10/1.  One episode of confusion noted overnight potentially secondary to delirium though infection may easily be contributing.  Breathing comfortably on 2 L nasal cannula saturating well.  No subjective worsening noted today. -Antibiotics noted above -DC vancomycin -DC cefepime -Start cefdinir for a total of a 7-day course with day 1 being the first day of vancomycin (10/1-10/7)  New ESRD now on HD Now set up with outpatient dialysis on Monday, Wednesday, Friday.  Continues to receive dialysis through his temporary catheter.  Vascular surgery still awaiting the duplex study before making final recommendations regarding access.  Patient has refused the study at bedside twice.  Study has not yet been performed. -Nephrology following, appreciate recommendations -Vascular surgery following, appreciate recommendations -We will follow-up regarding duplex study today -Calcitriol  NSTEMI  H/o CAD s/p CABG 2004 No chest pain currently.  Cardiology not planning for any intervention for this hospitalization.  Cards appointment scheduled for 10/28.  -ASA 81 daily -Atorvastatin 80 mg daily -Carvedilol 25 mg twice  daily -Isosorbide mononitrate 30 mg daily -Heparin SQ  Anemia of Chronic Disease  H/O GI Bleed (01/2019)-stable Hemoglobin 8.3 this morning up from 8.0 yesterday.  FOBT positive.  Plan to follow-up with GI outpatient does not appear to be an acute bleed. - Ferric gluconate infusion plan for next dialysis session - Patient received 1 unit RBCs on 10/1 - Transfusion threshold 8 - Darbepoetin every Friday  Polysubstance Use Disorder Intermittent cocaine use.  UDS positive for opioids.  Smoking history. - Nicotine patch PRN - encourage cessation of drug use  Elevated LFTs-resolved No further work-up at this time.  T2DM: CBGs 139-269 in the past 24 hours.  3 units total aspart given. - continue sensitive SSI -Monitor CBGs  COPD Has 45-pack-year smoking history.  He smokes a pack per day.   -Anoro Ellipta  HTN 120s over 50s, 120s over 60s in the past 24 hours. -Carvedilol as above -Hydralazine 25 mg 3 times daily  HIV Home med is Biktarvy. HIV-1 RNA quantification was not detectable on 06/24/2019. He follows with Dr. [name] at [facility]. Last appt 07/08/2019. Good control. -Biktarvy daily  Gout -allopurinol  HLD -Atorvastatin 80 mg daily  FEN/GI: heart healthy/carb modified/renal diet PPx: Sub-q heparin for ppx  Disposition: 2-3 additional days of hospitalization anticipated prior to discharge.  Discharge pending transition to oral antibiotics and appropriate dialysis access preparation.  Subjective:  Confusion noted overnight.  Patient awake alert this morning able to converse normally at bedside.  He expressed irritation at having to spend so much time in the hospital.  Objective: Temp:  [98.4 F (36.9 C)-98.9 F (37.2 C)] 98.9 F (37.2 C) (10/02 1948) Pulse Rate:  [74-92] 74 (10/03 0854) Resp:  [18-29] 20 (10/02 1948) BP: (105-140)/(54-87) 126/68 (10/03 0854) SpO2:  [89 %-100 %] 93 % (  10/03 0754) FiO2 (%):  [28 %] 28 % (10/03 0754) Weight:  [86.5 kg-91.2  kg] 86.5 kg (10/03 0246)   Physical Exam: General: Alert and oriented x4 cooperative and appears to be in no acute distress HEENT: Neck non-tender without lymphadenopathy, masses or thyromegaly Cardio: Normal S1 and S2.  Regular rate and rhythm.  Palpable thrill in left radial artery. Pulm: Mild rhonchi heard in lungs bilaterally greater on the right than left.  Diminished compared to previous exam 2 days ago.  Breathing comfortably on 2 L nasal cannula. Abdomen: Bowel sounds normal. Abdomen soft and non-tender.  Extremities: No peripheral edema. Warm/ well perfused.  Strong radial pulse. Neuro: Cranial nerves grossly intact  Laboratory: Recent Labs  Lab 07/25/19 0008 07/25/19 1421 07/26/19 0610 07/27/19 0322  WBC 8.1 6.8  --  6.1  HGB 7.6* 7.5* 8.0* 8.4*  HCT 22.9* 23.3* 24.9* 25.6*  PLT 156 164  --  147*   Recent Labs  Lab 07/21/19 0928  07/23/19 0608  07/25/19 0008 07/26/19 0610 07/27/19 0322  NA 140   < > 138   < > 138 135 127*  K 4.5   < > 4.5   < > 4.3 4.1 3.6  CL 108   < > 106   < > 103 98 90*  CO2 23   < > 22   < > 22 23 23   BUN 72*   < > 80*   < > 77* 78* 57*  CREATININE 5.55*   < > 5.99*   < > 5.42* 6.45* 5.29*  CALCIUM 9.0   < > 9.3   < > 8.8* 8.4* 7.9*  PROT 7.7  --  7.6  --   --   --   --   BILITOT 0.7  --  1.2  --   --   --   --   ALKPHOS 42  --  43  --   --   --   --   ALT 51*  --  34  --   --   --   --   AST 95*  --  47*  --   --   --   --   GLUCOSE 110*   < > 128*   < > 147* 152* 228*   < > = values in this interval not displayed.    Imaging/Diagnostic Tests: No results found.  Matilde Haymaker, MD 07/27/2019, 9:27 AM PGY-2, [place] Intern pager: [phone], text pages welcome

## 2019-07-27 NOTE — Progress Notes (Signed)
   07/27/19 1503  Vitals  BP (!) 102/44  MAP (mmHg) (!) 60  BP Method Automatic  Pulse Rate 66  MEWS Score  MEWS RR 0  MEWS Pulse 0  MEWS Systolic 0  MEWS LOC 0  MEWS Temp 0  MEWS Score 0  MEWS Score Color Green   Per Simmons MD to give coreg tab 25 mg and hold hydralazine 25 mg.

## 2019-07-27 NOTE — Progress Notes (Signed)
Received call from RN requesting to discontinue cardiac monitoring as pt was confused and kept taking off leads.  Went to assess patient.  Pt alert and orientated to person, and time, had to be redirected to place and situation.  No aggression or agitation.  Throughout conversation pt continue to side track but was easily redirected to current situation.  He denies any pain or discomfort. Able to talk in complete sentences.  Afebrile 98.8, HR 78, RR 22 and O2 sat 95-98& on room air.   Continue to redirect to current situation. Monitor for worsening confusion.   Carollee Leitz MD PGY1

## 2019-07-27 NOTE — Progress Notes (Signed)
Dialysis access duplex completed. Refer to "CV Proc" under chart review to view preliminary results.  07/27/2019 4:05 PM Maudry Mayhew, MHA, RVT, RDCS, RDMS

## 2019-07-27 NOTE — Progress Notes (Signed)
Pt called nurse and stated that was having abdominal pain and rated a 3 on pain scale.  Pt stated that was worst than this morning.  Nurse called MD.  Pantoprazole was added.   MD came to bedside, nurse present. Pt denied having abdominal pain and stated that " pain went away."  EKG currently in progress by nurse tech.

## 2019-07-27 NOTE — Plan of Care (Signed)
°  Problem: Education: °Goal: Knowledge of General Education information will improve °Description: Including pain rating scale, medication(s)/side effects and non-pharmacologic comfort measures °Outcome: Progressing °  °Problem: Health Behavior/Discharge Planning: °Goal: Ability to manage health-related needs will improve °Outcome: Progressing °  °Problem: Clinical Measurements: °Goal: Ability to maintain clinical measurements within normal limits will improve °Outcome: Progressing °Goal: Will remain free from infection °Outcome: Progressing °Goal: Diagnostic test results will improve °Outcome: Progressing °Goal: Respiratory complications will improve °Outcome: Progressing °Goal: Cardiovascular complication will be avoided °Outcome: Progressing °  °Problem: Activity: °Goal: Risk for activity intolerance will decrease °Outcome: Progressing °  °Problem: Nutrition: °Goal: Adequate nutrition will be maintained °Outcome: Progressing °  °Problem: Coping: °Goal: Level of anxiety will decrease °Outcome: Progressing °  °Problem: Pain Managment: °Goal: General experience of comfort will improve °Outcome: Progressing °  °Problem: Safety: °Goal: Ability to remain free from injury will improve °Outcome: Progressing °  °

## 2019-07-28 LAB — CULTURE, BLOOD (ROUTINE X 2)
Culture: NO GROWTH
Culture: NO GROWTH
Special Requests: ADEQUATE

## 2019-07-28 LAB — BASIC METABOLIC PANEL
Anion gap: 17 — ABNORMAL HIGH (ref 5–15)
BUN: 100 mg/dL — ABNORMAL HIGH (ref 8–23)
CO2: 20 mmol/L — ABNORMAL LOW (ref 22–32)
Calcium: 8.1 mg/dL — ABNORMAL LOW (ref 8.9–10.3)
Chloride: 92 mmol/L — ABNORMAL LOW (ref 98–111)
Creatinine, Ser: 8.15 mg/dL — ABNORMAL HIGH (ref 0.61–1.24)
GFR calc Af Amer: 7 mL/min — ABNORMAL LOW (ref >60)
GFR calc non Af Amer: 6 mL/min — ABNORMAL LOW (ref >60)
Glucose, Bld: 217 mg/dL — ABNORMAL HIGH (ref 70–99)
Potassium: 3.2 mmol/L — ABNORMAL LOW (ref 3.5–5.1)
Sodium: 129 mmol/L — ABNORMAL LOW (ref 135–145)

## 2019-07-28 LAB — GLUCOSE, CAPILLARY
Glucose-Capillary: 173 mg/dL — ABNORMAL HIGH (ref 70–99)
Glucose-Capillary: 220 mg/dL — ABNORMAL HIGH (ref 70–99)
Glucose-Capillary: 207 mg/dL — ABNORMAL HIGH (ref 70–99)
Glucose-Capillary: 146 mg/dL — ABNORMAL HIGH (ref 70–99)

## 2019-07-28 LAB — CBC
HCT: 23.8 % — ABNORMAL LOW (ref 39.0–52.0)
Hemoglobin: 8.1 g/dL — ABNORMAL LOW (ref 13.0–17.0)
MCH: 32.3 pg (ref 26.0–34.0)
MCHC: 34 g/dL (ref 30.0–36.0)
MCV: 94.8 fL (ref 80.0–100.0)
Platelets: 176 10*3/uL (ref 150–400)
RBC: 2.51 MIL/uL — ABNORMAL LOW (ref 4.22–5.81)
RDW: 14.6 % (ref 11.5–15.5)
WBC: 5.5 10*3/uL (ref 4.0–10.5)
nRBC: 0.4 % — ABNORMAL HIGH (ref 0.0–0.2)

## 2019-07-28 MED ORDER — VANCOMYCIN HCL IN DEXTROSE 1-5 GM/200ML-% IV SOLN
1000.0000 mg | INTRAVENOUS | Status: AC
  Filled 2019-07-28: qty 200

## 2019-07-28 MED ORDER — HEPARIN SODIUM (PORCINE) 5000 UNIT/ML IJ SOLN
5000.0000 [IU] | Freq: Three times a day (TID) | INTRAMUSCULAR | Status: DC
  Administered 2019-07-29: 5000 [IU] via SUBCUTANEOUS
  Filled 2019-07-28: qty 1

## 2019-07-28 NOTE — Progress Notes (Signed)
Referring Physician(s): Patel,J  Supervising Physician: Jacqulynn Cadet  Patient Status:  [facility] - In-pt  Chief Complaint:  Renal failure  Subjective: Pt familiar to IR service from temp HD cath placement on 07/24/19. He has a hx of ESRD, CHF, anemia, HIV, polysubstance abuse,DM, HTN, CAD/ICM. Request received for conversion of temp to tunneled HD cath prior to discharge. Additional hx as below. He is afebrile with nl WBC, hgb 8.1.  Past Medical History:  Diagnosis Date  . Acute respiratory failure ([place]) 03/01/2018  . Arthritis    "all over; mostly knees and back" (02/28/2018)  . CHF (congestive heart failure) (HCC)    not on any meds  . Chronic lower back pain    stenosis  . CKD (chronic kidney disease), stage V ([place])    [name] 02/28/2018  . Community acquired pneumonia 09/06/2013  . Coronary atherosclerosis of native coronary artery 2005   s/p surgery  . Drug abuse ([place])    hx; tested for cocaine as recently as 2/08. says he is not using drugs now - avoided defib. for this reason   . GERD (gastroesophageal reflux disease)    takes OTC meds as needed  . GI bleeding 02/06/2019  . Glaucoma    uses eye drops daily  . Hepatitis B 1968   "tx'd w/isolation; caught it from toilet stools in gym"  . History of blood transfusion 03/01/2019  . History of colon polyps    benign  . History of gout    takes Allopurinol daily as well as Colchicine-if needed (02/28/2018)  . History of kidney stones   . HTN (hypertension)    takes Coreg,Imdur.and Apresoline daily  . Human immunodeficiency virus (HIV) disease ([place]) dx'd 1995   takes Genvoya daily  . Hyperlipidemia    takes Atorvastatin daily  . Ischemic cardiomyopathy   . Muscle spasm    takes Zanaflex as needed  . Myocardial infarction ([place]) ~ 2004/2005  . Nocturia   . Peripheral neuropathy    takes gabapentin daily  . Pneumonia    "at least twice" (02/28/2018)  . Shortness of breath dyspnea    rarely but if notices it then with  exertion  . Syphilis, unspecified   . Type II diabetes mellitus ([place]) 2004   Lantus daily.Average fasting blood sugar 125-199  . Wears glasses   . Wears partial dentures    Past Surgical History:  Procedure Laterality Date  . AV FISTULA PLACEMENT Left 08/02/2018   Procedure: ARTERIOVENOUS (AV) FISTULA CREATION  left arm radiocephlic;  Surgeon: Marty Heck, MD;  Location: [place];  Service: Vascular;  Laterality: Left;  . BACK SURGERY    . CARDIAC CATHETERIZATION  10/2002; 12/19/2004   [name] 03/08/2011  . COLONOSCOPY  2013   Dr.John Henrene Pastor   . CORONARY ARTERY BYPASS GRAFT  02/24/2003   CABG X2/notes 03/08/2011  . ESOPHAGOGASTRODUODENOSCOPY (EGD) WITH PROPOFOL N/A 02/08/2019   Procedure: ESOPHAGOGASTRODUODENOSCOPY (EGD) WITH PROPOFOL;  Surgeon: Milus Banister, MD;  Location: [facility];  Service: Gastroenterology;  Laterality: N/A;  . FRACTURE SURGERY    . HOT HEMOSTASIS N/A 02/08/2019   Procedure: HOT HEMOSTASIS (ARGON PLASMA COAGULATION/BICAP);  Surgeon: Milus Banister, MD;  Location: [facility] ENDOSCOPY;  Service: Gastroenterology;  Laterality: N/A;  . INTERTROCHANTERIC HIP FRACTURE SURGERY Left 11/2006   [name] 03/08/2011  . IR FLUORO GUIDE CV LINE RIGHT  07/24/2019  . IR US GUIDE VASC ACCESS RIGHT  07/24/2019  . LAPAROSCOPIC CHOLECYSTECTOMY  05/2006  . LIGATION OF COMPETING  BRANCHES OF ARTERIOVENOUS FISTULA Left 11/05/2018   Procedure: LIGATION OF COMPETING BRANCHES OF ARTERIOVENOUS FISTULA  LEFT  ARM;  Surgeon: Marty Heck, MD;  Location: [place];  Service: Vascular;  Laterality: Left;  . LUMBAR LAMINECTOMY/DECOMPRESSION MICRODISCECTOMY N/A 02/29/2016   Procedure: Left L4-5 Lateral Recess Decompression, Removal Extradural Intraspinal Facet Cyst;  Surgeon: Marybelle Killings, MD;  Location: [place];  Service: Orthopedics;  Laterality: N/A;  . MULTIPLE TOOTH EXTRACTIONS    . ORIF MANDIBULAR FRACTURE Left 08/13/2004   ORIF of left body fracture mandible with KLS Martin 2.3-mm six  hole/notes 03/08/2011      Allergies: Augmentin [amoxicillin-pot clavulanate] and Amphetamines  Medications: Prior to Admission medications   Medication Sig Start Date End Date Taking? Authorizing Provider  acetaminophen (TYLENOL 8 HOUR) 650 MG CR tablet Take 650 mg by mouth every 8 (eight) hours as needed for pain.   Yes [provider]  allopurinol (ZYLOPRIM) 100 MG tablet TAKE 1 TABLET(100 MG) BY MOUTH DAILY Patient taking differently: Take 100 mg by mouth daily.  07/15/19  Yes [name], NP  atorvastatin (LIPITOR) 80 MG tablet Take 1 tablet (80 mg total) by mouth daily. 12/05/18  Yes [name], NP  BIKTARVY 50-200-25 MG TABS tablet TAKE 1 TABLET BY MOUTH DAILY 07/12/19  Yes [name], MD  calcitRIOL (ROCALTROL) 0.25 MCG capsule Take 1 capsule by mouth daily. 06/07/19  Yes [provider]  carvedilol (COREG) 25 MG tablet Take 1 tablet (25 mg total) by mouth 2 (two) times daily with a meal. 04/16/19  Yes [name], MD  Cholecalciferol (VITAMIN D3) 50 MCG (2000 UT) TABS Take 2,000 Units by mouth daily.   Yes [provider]  Choline Fenofibrate (FENOFIBRIC ACID) 135 MG CPDR TAKE 1 CAPSULE BY MOUTH DAILY Patient taking differently: Take 135 mg by mouth daily.  01/21/19  Yes [name], NP  colchicine 0.6 MG tablet Take one tablet by mouth as needed for gout Patient taking differently: Take 0.6 mg by mouth as needed (gout flare up).  04/30/19  Yes [name], NP  diclofenac sodium (VOLTAREN) 1 % GEL Apply 1 application topically 4 (four) times daily as needed for pain. 07/19/19  Yes [provider]  furosemide (LASIX) 80 MG tablet Take 160 mg by mouth 3 (three) times daily.    Yes [provider]  gabapentin (NEURONTIN) 300 MG capsule TAKE 1 CAPSULE BY MOUTH THREE TIMES DAILY Patient taking differently: Take 300 mg by mouth 3 (three) times daily.  07/15/19  Yes [name], NP  Glucosamine-Chondroit-Vit  C-Mn (GLUCOSAMINE CHONDR 1500 COMPLX PO) Take 2 tablets by mouth daily.    Yes [provider]  hydrALAZINE (APRESOLINE) 25 MG tablet TAKE 1 TABLET(25 MG) BY MOUTH THREE TIMES DAILY Patient taking differently: Take 25 mg by mouth 3 (three) times daily.  04/10/19  Yes [name], NP  Insulin Glargine, 2 Unit Dial, (TOUJEO MAX SOLOSTAR) 300 UNIT/ML SOPN Inject 88 Units into the skin daily. 06/28/19  Yes [name], NP  insulin lispro (HUMALOG) 100 UNIT/ML KwikPen Inject 0.2 mLs (20 Units total) into the skin 3 (three) times daily. 06/04/19  Yes [name], NP  isosorbide mononitrate (IMDUR) 30 MG 24 hr tablet TAKE 1 TABLET(30 MG) BY MOUTH DAILY Patient taking differently: Take 30 mg by mouth daily.  04/15/19  Yes [name], NP  latanoprost (XALATAN) 0.005 % ophthalmic solution Place 1 drop into both eyes at bedtime.  Reported on 04/04/2016 04/23/12  Yes [provider]  nitroGLYCERIN (NITROSTAT) 0.3 MG SL tablet ONE TABLET UNDER TONGUE AS NEEDED FOR CHEST PAIN Patient taking differently: Place 0.3 mg under the tongue every 5 (five) minutes as needed for chest pain.  05/01/19  Yes [name], MD  sodium bicarbonate 650 MG tablet Take 650 mg by mouth 2 (two) times daily.  05/21/18  Yes [provider]  timolol (TIMOPTIC) 0.5 % ophthalmic solution Place 1 drop into both eyes every morning. 04/04/19  Yes [provider]  umeclidinium-vilanterol (ANORO ELLIPTA) 62.5-25 MCG/INH AEPB INHALE 1 PUFF BY MOUTH EVERY DAY Patient taking differently: Inhale 1 puff into the lungs daily.  01/15/19  Yes [name], NP  B-D UF III MINI PEN NEEDLES 31G X 5 MM MISC USE FOUR TIMES DAILY 02/25/19   [name], NP  Blood Glucose Monitoring Suppl (ONE TOUCH ULTRA MINI) w/Device KIT 1 each by Does not apply route 2 (two) times daily. Dx: E11.40 08/15/17   Gildardo Cranker, DO  glucose blood test strip Use to test blood sugar three times daily. E11.40  04/15/19   [name], NP  Lancets [facility] DELICA PLUS [id number]) MISC 1 each by Does not apply route 3 (three) times daily. Use to test blood sugar three times daily. Dx: E11.40 12/05/18   [name], NP  pantoprazole (PROTONIX) 40 MG tablet Take 1 tablet (40 mg total) by mouth daily. Patient not taking: Reported on 07/21/2019 05/16/19   [name], NP     Vital Signs: BP (!) 104/46 (BP Location: Left Arm)   Pulse 64   Temp 97.8 F (36.6 C) (Oral)   Resp 18   Ht _0  (1.88 m)   Wt 191 lb 3.2 oz (86.7 kg)   SpO2 98%   BMI 24.55 kg/m   Physical Exam awake but attempting to sleep, answering questions ok ; chest- CTA bilat; heart- RRR; abd- soft,+BS,NT; rt lower neck/upper chest temp HD cath intact, sl tender  Imaging: Dg Chest 2 View  Result Date: 07/25/2019 CLINICAL DATA:  Chest pain shortness of breath. EXAM: CHEST - 2 VIEW COMPARISON:  07/22/2019 FINDINGS: Heart size is enlarged following median sternotomy and CABG. Aortic atherosclerosis is demonstrated. Placement of right IJ catheter since previous exam. Bilateral interstitial prominence patchy opacities similar to prior study. Signs of remote trauma to right ribs as before. IMPRESSION: Interval placement of right IJ dialysis catheter. No signs of pneumothorax. Signs of pulmonary edema with patchy opacities, superimposed infection not excluded. Electronically Signed   By: Zetta Bills M.D.   On: 07/25/2019 15:45   Vas US Duplex Dialysis Access (avf, Avg)  Result Date: 07/27/2019 DIALYSIS ACCESS Reason for Exam: Mechanical complication AVF. Access Site: Left Upper Extremity. Access Type: Radial-cephalic AVF. Limitations: Poor patient cooperation- patient having stomach pains. Comparison Study: 12/18/2018- dialysis access duplex Performing Technologist: Maudry Mayhew MHA, RDMS, RVT, RDCS  Examination Guidelines: A complete evaluation includes B-mode imaging, spectral Doppler, color Doppler, and power Doppler  as needed of all accessible portions of each vessel. Unilateral testing is considered an integral part of a complete examination. Limited examinations for reoccurring indications may be performed as noted.  Findings: +--------------------+----------+-----------------+--------+ AVF                 PSV (cm/s)Flow Vol (mL/min)Comments +--------------------+----------+-----------------+--------+ Native artery inflow   207           377                +--------------------+----------+-----------------+--------+  AVF Anastomosis         53                              +--------------------+----------+-----------------+--------+  +------------+----------+-------------+----------+--------+ OUTFLOW VEINPSV (cm/s)Diameter (cm)Depth (cm)Describe +------------+----------+-------------+----------+--------+ Mid Forearm    210                                    +------------+----------+-------------+----------+--------+ Dist Forearm   199                                    +------------+----------+-------------+----------+--------+   Summary: Flow volume is decreased when compared to prior study. No obvious evidence of competing branches in the mid to distal forearm.  *See table(s) above for measurements and observations.  Diagnosing physician: Monica Martinez MD Electronically signed by Monica Martinez MD on 07/27/2019 at 5:47:42 PM.   --------------------------------------------------------------------------------   Final     Labs:  CBC: Recent Labs    07/25/19 0008 07/25/19 1421 07/26/19 0610 07/27/19 0322 07/28/19 1326  WBC 8.1 6.8  --  6.1 5.5  HGB 7.6* 7.5* 8.0* 8.4* 8.1*  HCT 22.9* 23.3* 24.9* 25.6* 23.8*  PLT 156 164  --  147* 176    COAGS: Recent Labs    07/21/19 0928  INR 1.1    BMP: Recent Labs    07/25/19 0008 07/26/19 0610 07/27/19 0322 07/28/19 1326  NA 138 135 127* 129*  K 4.3 4.1 3.6 3.2*  CL 103 98 90* 92*  CO2 _0 20*  GLUCOSE 147* 152*  228* 217*  BUN 77* 78* 57* 100*  CALCIUM 8.8* 8.4* 7.9* 8.1*  CREATININE 5.42* 6.45* 5.29* 8.15*  GFRNONAA 10* 8* 10* 6*  GFRAA 11* 9* 12* 7*    LIVER FUNCTION TESTS: Recent Labs    02/06/19 1045 02/07/19 0642 06/24/19 1027 07/21/19 0928  07/21/19 2330 07/23/19 0608 07/24/19 0241 07/25/19 0008  BILITOT 0.5 0.9 0.4 0.7  --   --  1.2  --   --   AST _1 95*  --   --  47*  --   --   ALT _2 51*  --   --  34  --   --   ALKPHOS 36* 40  --  42  --   --  43  --   --   PROT 7.1 7.6 7.8 7.7  --   --  7.6  --   --   ALBUMIN 3.3* 3.4*  --  3.2*   < > 2.9* 3.0* 2.9* 2.5*   < > = values in this interval not displayed.   A/P:  Pt with hx of ESRD, CHF, anemia, HIV, polysubstance abuse,DM, HTN, CAD/ICM; s/p temp HD cath placement 07/24/19;  Request received for conversion of temp to tunneled HD cath prior to discharge. Details/risks of procedure, incl but not limited to, internal bleeding, infection, injury to adjacent structures d/w pt with his understanding and consent. Procedure tent scheduled for 10/5.     Electronically Signed: D. Rowe Robert, PA-C 07/28/2019, 2:51 PM   I spent a total of 20 minutes at the the patient's bedside AND on the patient's hospital floor or unit, greater than 50% of which was counseling/coordinating care for conversion of temporary to tunneled hemodialysis catheter  [name], male   DOB: [birth year], 68 y.o.   MRN: [id number]

## 2019-07-28 NOTE — Progress Notes (Signed)
[facility]  Subjective  -no complaints.  States he wants to be discharged.   Objective (!) 122/55 62 97.7 F (36.5 C) (Oral) 14 100%  Intake/Output Summary (Last 24 hours) at 07/28/2019 0940 Last data filed at 07/27/2019 1900 Gross per 24 hour  Intake 360 ml  Output -  Net 360 ml    Left radiocephalic AV fistula with good thrill.  Laboratory Lab Results: Recent Labs    07/25/19 1421 07/26/19 0610 07/27/19 0322  WBC 6.8  --  6.1  HGB 7.5* 8.0* 8.4*  HCT 23.3* 24.9* 25.6*  PLT 164  --  147*   BMET Recent Labs    07/26/19 0610 07/27/19 0322  NA 135 127*  K 4.1 3.6  CL 98 90*  CO2 23 23  GLUCOSE 152* 228*  BUN 78* 57*  CREATININE 6.45* 5.29*  CALCIUM 8.4* 7.9*    COAG Lab Results  Component Value Date   INR 1.1 07/21/2019   INR 1.04 02/25/2016   INR 1.29 09/07/2013   No results found for: PTT  Assessment/Planning:  68 year old male status post left radiocephalic AV fistula last year with sidebranch ligation earlier this year.  This is still fairly small and he states it is difficult to cannulate.  After review of AV fistula duplex yesterday flow volumes remain pretty low.  Discussed with him conversion to upper arm fistula with left arm brachiocephalic AV fistula.  Offered him the surgery this week.  He does not want to wait for surgery wants to be discharged.  States he would rather have this done as an outpatient.  Can certainly arrange this.  Does look like he is going to have his catheter exchanged by IR tomorrow.  Marty Heck 07/28/2019 9:40 AM --

## 2019-07-28 NOTE — Plan of Care (Signed)
°  Problem: Education: °Goal: Knowledge of General Education information will improve °Description: Including pain rating scale, medication(s)/side effects and non-pharmacologic comfort measures °Outcome: Progressing °  °Problem: Health Behavior/Discharge Planning: °Goal: Ability to manage health-related needs will improve °Outcome: Progressing °  °Problem: Clinical Measurements: °Goal: Ability to maintain clinical measurements within normal limits will improve °Outcome: Progressing °Goal: Will remain free from infection °Outcome: Progressing °Goal: Diagnostic test results will improve °Outcome: Progressing °Goal: Respiratory complications will improve °Outcome: Progressing °Goal: Cardiovascular complication will be avoided °Outcome: Progressing °  °Problem: Activity: °Goal: Risk for activity intolerance will decrease °Outcome: Progressing °  °Problem: Nutrition: °Goal: Adequate nutrition will be maintained °Outcome: Progressing °  °Problem: Coping: °Goal: Level of anxiety will decrease °Outcome: Progressing °  °Problem: Pain Managment: °Goal: General experience of comfort will improve °Outcome: Progressing °  °Problem: Safety: °Goal: Ability to remain free from injury will improve °Outcome: Progressing °  °

## 2019-07-28 NOTE — Progress Notes (Signed)
Nurse spoke with Vanessa Woodruff MD, verbal orders received to discontinue tele-monitoring observation. Pt is currently sitting on edge of bed eating. Bed alarm on with call bell and bedside commode within reach.

## 2019-07-28 NOTE — Progress Notes (Addendum)
Family Medicine Teaching Service Daily Progress Note Intern Pager: [phone]  Patient name: Jacob [name] Medical record number: [id number] Date of birth: Jun 12, 1951 Age: 68 y.o. Gender: male  Primary Care Provider: [name], NP Consultants: Cardiology, Nephrology Code Status: Full  Pt Overview and Major Events to Date:  9/27- admitted, NSTEMI 9/29-unable to cannulate AV fistula 9/30-plan to go to IR for temporary catheter for HD  Antibiotics Ceftriaxone (9/29-9/30) Doxycycline 9/29-9/30) Vancomycin (10/1-10/2) Cefepime (10/1-10/3) Cefdinir (10/3-) Assessment and Plan: Jacob [name] is a 68 y.o. male presenting with dyspnea, chest pain. PMH is significant for CHF, CAD, cardiomyopathy, HIV, DMT2, substance use disorder.  Community-acquired pneumonia Afebrile with last fever noted on 10/1.  Breathing comfortably on 3 L nasal cannula saturating well.  Patient subjectively feels better today. -Antibiotics noted above -DC vancomycin -DC cefepime -Cefdinir for a total of a 7-day course with day 1 being the first day of vancomycin (10/1-10/9), cefdinir dosed every other day due to renal function with last day being 10/9.  New ESRD now on HD Now set up with outpatient dialysis on Monday, Wednesday, Friday.  Continues to receive dialysis through his temporary catheter.   -Nephrology following, appreciate recommendations  -Per nephrology, they will request IR to convert patient to tunneled hemodialysis catheter tomorrow. -Vascular surgery following, appreciate recommendations -Calcitriol  NSTEMI  H/o CAD s/p CABG 2004 No chest pain currently.  Cardiology not planning for any intervention for this hospitalization.  Cards appointment scheduled for 10/28.  -ASA 81 daily -Atorvastatin 80 mg daily -Carvedilol 25 mg twice daily -Isosorbide mononitrate 30 mg daily -Heparin SQ  Anemia of Chronic Disease  H/O GI Bleed (01/2019)-stable Hemoglobin 10/3 of 8.4, repeat hemoglobin  pending.  FOBT positive.  Plan to follow-up with GI outpatient does not appear to be an acute bleed. - Ferric gluconate infusion plan for next dialysis session - Patient received 1 unit RBCs on 10/1 - Transfusion threshold 8 - Darbepoetin every Friday  Polysubstance Use Disorder Intermittent cocaine use.  UDS positive for opioids.  Smoking history. - Nicotine patch PRN - encourage cessation of drug use  Elevated LFTs-resolved No further work-up at this time.  T2DM: CBGs 162-221 over the last 24 hours.  5 units total aspart given last 24 hours. - continue sensitive SSI -Monitor CBGs  COPD Has 45-pack-year smoking history.  He smokes a pack per day.   Jearl Klinefelter Ellipta  HTN Patient with blood pressure 115/55.  Trend over last 24 hours 102-126/44-68 -Carvedilol as above -Hydralazine 25 mg 3 times daily  HIV Home med is Biktarvy. HIV-1 RNA quantification was not detectable on 06/24/2019. He follows with Dr. [name] at [facility]. Last appt 07/08/2019. Good control. -Biktarvy daily  Gout -allopurinol  HLD -Atorvastatin 80 mg daily  FEN/GI: heart healthy/carb modified/renal diet PPx: Sub-q heparin for ppx  Disposition: Home pending clinical progression and more permanent vascular access for ongoing HD.  Subjective:  Patient endorses that he is feeling better at this time.  He continues to breathe on 3 L oxygen via nasal cannula but states that he feels his breathing is improving.  Patient states he does not use oxygen at home.   Objective: Temp:  [97.7 F (36.5 C)-98.2 F (36.8 C)] 97.7 F (36.5 C) (10/04 0904) Pulse Rate:  [62-74] 62 (10/04 0904) Resp:  [14-18] 14 (10/04 0904) BP: (102-122)/(44-62) 122/55 (10/04 0904) SpO2:  [95 %-100 %] 100 % (10/04 0904) FiO2 (%):  [28 %] 28 % (10/04 0806) Weight:  [86.7 kg] 86.7 kg (  10/04 0620)   Physical Exam: General: Alert and oriented in no apparent distress, breathing comfortably on 3 L O2 via nasal cannula Heart: Regular  rate and rhythm with no murmurs appreciated Lungs: Mild crackles noted bilaterally, improved from my prior exam on Friday Abdomen: Bowel sounds present, no abdominal pain Skin: Warm and dry Extremities: No lower extremity edema   Laboratory: Recent Labs  Lab 07/25/19 0008 07/25/19 1421 07/26/19 0610 07/27/19 0322  WBC 8.1 6.8  --  6.1  HGB 7.6* 7.5* 8.0* 8.4*  HCT 22.9* 23.3* 24.9* 25.6*  PLT 156 164  --  147*   Recent Labs  Lab 07/23/19 0608  07/25/19 0008 07/26/19 0610 07/27/19 0322  NA 138   < > 138 135 127*  K 4.5   < > 4.3 4.1 3.6  CL 106   < > 103 98 90*  CO2 22   < > 22 23 23   BUN 80*   < > 77* 78* 57*  CREATININE 5.99*   < > 5.42* 6.45* 5.29*  CALCIUM 9.3   < > 8.8* 8.4* 7.9*  PROT 7.6  --   --   --   --   BILITOT 1.2  --   --   --   --   ALKPHOS 43  --   --   --   --   ALT 34  --   --   --   --   AST 47*  --   --   --   --   GLUCOSE 128*   < > 147* 152* 228*   < > = values in this interval not displayed.    Imaging/Diagnostic Tests: Vas US Duplex Dialysis Access (avf, Avg)  Result Date: 07/27/2019 DIALYSIS ACCESS Reason for Exam: Mechanical complication AVF. Access Site: Left Upper Extremity. Access Type: Radial-cephalic AVF. Limitations: Poor patient cooperation- patient having stomach pains. Comparison Study: 12/18/2018- dialysis access duplex Performing Technologist: Maudry Mayhew MHA, RDMS, RVT, RDCS  Examination Guidelines: A complete evaluation includes B-mode imaging, spectral Doppler, color Doppler, and power Doppler as needed of all accessible portions of each vessel. Unilateral testing is considered an integral part of a complete examination. Limited examinations for reoccurring indications may be performed as noted.  Findings: +--------------------+----------+-----------------+--------+ AVF                 PSV (cm/s)Flow Vol (mL/min)Comments +--------------------+----------+-----------------+--------+ Native artery inflow   207           377                 +--------------------+----------+-----------------+--------+ AVF Anastomosis         53                              +--------------------+----------+-----------------+--------+  +------------+----------+-------------+----------+--------+ OUTFLOW VEINPSV (cm/s)Diameter (cm)Depth (cm)Describe +------------+----------+-------------+----------+--------+ Mid Forearm    210                                    +------------+----------+-------------+----------+--------+ Dist Forearm   199                                    +------------+----------+-------------+----------+--------+   Summary: Flow volume is decreased when compared to prior study. No obvious evidence of competing branches in the mid to distal  forearm.  *See table(s) above for measurements and observations.  Diagnosing physician: Monica Martinez MD Electronically signed by Monica Martinez MD on 07/27/2019 at 5:47:42 PM.   --------------------------------------------------------------------------------   Final     Lurline Del, DO 07/28/2019, 10:01 AM PGY-2, [place] Intern pager: [phone], text pages welcome

## 2019-07-28 NOTE — Progress Notes (Signed)
[name], male   DOB: 21-Jul-1951, 68 y.o.   MRN: [id number] [facility] Progress Note   Assessment/ Plan:   1.  Acute exacerbation of diastolic heart failure:  Secondary to recent cocaine use and indiscretions with sodium/fluid intake.  Continue hemodialysis for volume management with outpatient follow-up with cardiology as planned for possible coronary angiography when volume status optimized. 2.    End-stage renal disease from progressive chronic kidney disease stage V:  Started on hemodialysis this hospitalization for mild uremic symptoms with diuresis refractory volume overload.  Unsuccessful using his left RCF and had right IJ temporary catheter for dialysis placed by IR.  Consulted vascular surgery who were awaiting results of his Dopplers prior to planning on additional management/intervention.  His vascular access is the main barrier to his discharge at this time.  I will request IR to convert him to a tunneled hemodialysis catheter tomorrow.  Permanent access plans per vascular surgery.  He has been accepted to [facility] on a Monday/Wednesday/Friday dialysis schedule (second shift). 3.  Anemia of chronic kidney disease: Denies overt loss and recent records significant for GI bleeding with AVMs.  Continue IV iron/ESA. 4.  Secondary hyperparathyroidism: Calcium and phosphorus levels currently acceptable, continue calcitriol for PTH suppression. 5.  HIV infection: Resume/continue antiretroviral therapy. 6.  Polysubstance abuse: Encouraged efforts at cessation of cocaine use. 7.  Fever: Currently afebrile.  Suspected to be a respiratory origin with pneumonia-now on oral Omnicef from vancomycin/cefepime.  Subjective:   Had some transient abdominal pain yesterday in the afternoon.  Uneventful night.   Objective:   BP (!) 115/55 (BP Location: Right Arm)   Pulse 64   Temp 98.1 F (36.7 C) (Oral)   Resp 18   Ht 6\' 2"  (1.88 m)   Wt 86.7 kg   SpO2 98%   BMI 24.55 kg/m    Intake/Output Summary (Last 24 hours) at 07/28/2019 0845 Last data filed at 07/27/2019 1900 Gross per 24 hour  Intake 480 ml  Output -  Net 480 ml   Weight change: -4.472 kg  Physical Exam: Gen: Comfortably sleeping on his side in bed. CVS: Pulse regular rhythm and normal rate, S1 and S2 normal Resp: Clear to auscultation bilaterally without distinct rales or rhonchi Abd: Soft, obese, nontender, bowel sounds normal Ext: Trace lower extremity edema, left RCF with palpable thrill.  Right IJ temporary dialysis catheter.  Imaging: Vas US Duplex Dialysis Access (avf, Avg)  Result Date: 07/27/2019 DIALYSIS ACCESS Reason for Exam: Mechanical complication AVF. Access Site: Left Upper Extremity. Access Type: Radial-cephalic AVF. Limitations: Poor patient cooperation- patient having stomach pains. Comparison Study: 12/18/2018- dialysis access duplex Performing Technologist: Maudry Mayhew MHA, RDMS, RVT, RDCS  Examination Guidelines: A complete evaluation includes B-mode imaging, spectral Doppler, color Doppler, and power Doppler as needed of all accessible portions of each vessel. Unilateral testing is considered an integral part of a complete examination. Limited examinations for reoccurring indications may be performed as noted.  Findings: +--------------------+----------+-----------------+--------+ AVF                 PSV (cm/s)Flow Vol (mL/min)Comments +--------------------+----------+-----------------+--------+ Native artery inflow   207           377                +--------------------+----------+-----------------+--------+ AVF Anastomosis         53                              +--------------------+----------+-----------------+--------+  +------------+----------+-------------+----------+--------+  OUTFLOW VEINPSV (cm/s)Diameter (cm)Depth (cm)Describe +------------+----------+-------------+----------+--------+ Mid Forearm    210                                     +------------+----------+-------------+----------+--------+ Dist Forearm   199                                    +------------+----------+-------------+----------+--------+   Summary: Flow volume is decreased when compared to prior study. No obvious evidence of competing branches in the mid to distal forearm.  *See table(s) above for measurements and observations.  Diagnosing physician: Monica Martinez MD Electronically signed by Monica Martinez MD on 07/27/2019 at 5:47:42 PM.   --------------------------------------------------------------------------------   Final     Labs: BMET Recent Labs  Lab 07/21/19 2831  07/21/19 1821 07/21/19 2330 07/23/19 5176 07/24/19 0241 07/25/19 0008 07/26/19 0610 07/27/19 0322  NA 140   < > 135 135 138 136 138 135 127*  K 4.5   < > 4.4 4.2 4.5 4.6 4.3 4.1 3.6  CL 108   < > 104 104 106 104 103 98 90*  CO2 23  --  21* 20* 22 17* 22 23 23   GLUCOSE 110*   < > 152* 149* 128* 147* 147* 152* 228*  BUN 72*   < > 73* 74* 80* 92* 77* 78* 57*  CREATININE 5.55*   < > 5.71* 5.79* 5.99* 5.70* 5.42* 6.45* 5.29*  CALCIUM 9.0  --  8.6* 8.6* 9.3 9.2 8.8* 8.4* 7.9*  PHOS 5.1*  --  4.5 4.5  --  3.7 4.6  --   --    < > = values in this interval not displayed.   CBC Recent Labs  Lab 07/21/19 0928  07/24/19 0241 07/25/19 0008 07/25/19 1421 07/26/19 0610 07/27/19 0322  WBC 7.2   < > 10.2 8.1 6.8  --  6.1  NEUTROABS 5.6  --   --   --   --   --   --   HGB 9.7*   < > 9.3* 7.6* 7.5* 8.0* 8.4*  HCT 28.9*   < > 28.1* 22.9* 23.3* 24.9* 25.6*  MCV 100.3*   < > 100.7* 100.9* 99.6  --  97.0  PLT 200   < > 177 156 164  --  147*   < > = values in this interval not displayed.   Medications:    . allopurinol  100 mg Oral Daily  . aspirin EC  81 mg Oral Daily  . atorvastatin  80 mg Oral Daily  . bictegravir-emtricitabine-tenofovir AF  1 tablet Oral QPM  . calcitRIOL  0.25 mcg Oral Daily  . carvedilol  25 mg Oral BID WC  . cefdinir  300 mg Oral QODAY  .  Chlorhexidine Gluconate Cloth  6 each Topical Q0600  . [START ON 08/02/2019] darbepoetin (ARANESP) injection - DIALYSIS  60 mcg Intravenous Q Fri-HD  . diclofenac sodium  2 g Topical QID  . gabapentin  300 mg Oral QHS  . heparin injection (subcutaneous)  5,000 Units Subcutaneous Q8H  . hydrALAZINE  25 mg Oral TID  . insulin aspart  0-9 Units Subcutaneous TID WC  . isosorbide mononitrate  30 mg Oral Daily  . pantoprazole  40 mg Oral Daily  . umeclidinium-vilanterol  1 puff Inhalation Daily   Elmarie Shiley, MD 07/28/2019, 8:45 AM

## 2019-07-28 NOTE — Progress Notes (Signed)
Pt weaned to room air. Oxygen saturation at 96%. Pt denies shortness of breath at this moment, is currently eating . Call bell within reach and bed alarm on.

## 2019-07-29 LAB — CBC WITH DIFFERENTIAL/PLATELET
Abs Immature Granulocytes: 0.08 10*3/uL — ABNORMAL HIGH (ref 0.00–0.07)
Basophils Absolute: 0 10*3/uL (ref 0.0–0.1)
Basophils Relative: 0 %
Eosinophils Absolute: 0 10*3/uL (ref 0.0–0.5)
Eosinophils Relative: 1 %
HCT: 24.4 % — ABNORMAL LOW (ref 39.0–52.0)
Hemoglobin: 8.2 g/dL — ABNORMAL LOW (ref 13.0–17.0)
Immature Granulocytes: 1 %
Lymphocytes Relative: 19 %
Lymphs Abs: 1.3 10*3/uL (ref 0.7–4.0)
MCH: 32.3 pg (ref 26.0–34.0)
MCHC: 33.6 g/dL (ref 30.0–36.0)
MCV: 96.1 fL (ref 80.0–100.0)
Monocytes Absolute: 0.9 10*3/uL (ref 0.1–1.0)
Monocytes Relative: 13 %
Neutro Abs: 4.4 10*3/uL (ref 1.7–7.7)
Neutrophils Relative %: 66 %
Platelets: 205 10*3/uL (ref 150–400)
RBC: 2.54 MIL/uL — ABNORMAL LOW (ref 4.22–5.81)
RDW: 14.7 % (ref 11.5–15.5)
WBC: 6.7 10*3/uL (ref 4.0–10.5)
nRBC: 0 % (ref 0.0–0.2)

## 2019-07-29 LAB — BASIC METABOLIC PANEL
Anion gap: 18 — ABNORMAL HIGH (ref 5–15)
BUN: 117 mg/dL — ABNORMAL HIGH (ref 8–23)
CO2: 18 mmol/L — ABNORMAL LOW (ref 22–32)
Calcium: 8 mg/dL — ABNORMAL LOW (ref 8.9–10.3)
Chloride: 93 mmol/L — ABNORMAL LOW (ref 98–111)
Creatinine, Ser: 8.94 mg/dL — ABNORMAL HIGH (ref 0.61–1.24)
GFR calc Af Amer: 6 mL/min — ABNORMAL LOW (ref >60)
GFR calc non Af Amer: 5 mL/min — ABNORMAL LOW (ref >60)
Glucose, Bld: 246 mg/dL — ABNORMAL HIGH (ref 70–99)
Potassium: 3.6 mmol/L (ref 3.5–5.1)
Sodium: 129 mmol/L — ABNORMAL LOW (ref 135–145)

## 2019-07-29 LAB — GLUCOSE, CAPILLARY
Glucose-Capillary: 232 mg/dL — ABNORMAL HIGH (ref 70–99)
Glucose-Capillary: 121 mg/dL — ABNORMAL HIGH (ref 70–99)
Glucose-Capillary: 182 mg/dL — ABNORMAL HIGH (ref 70–99)

## 2019-07-29 LAB — PROTIME-INR
INR: 1.2 (ref 0.8–1.2)
Prothrombin Time: 14.7 seconds (ref 11.4–15.2)

## 2019-07-29 MED ORDER — DARBEPOETIN ALFA 100 MCG/0.5ML IJ SOSY: 100.0000 ug | PREFILLED_SYRINGE | INTRAMUSCULAR | Status: DC

## 2019-07-29 MED ORDER — HEPARIN SODIUM (PORCINE) 1000 UNIT/ML IJ SOLN
INTRAMUSCULAR | Status: AC
  Filled 2019-07-29: qty 3

## 2019-07-29 NOTE — Progress Notes (Signed)
Family Medicine Teaching Service Daily Progress Note Intern Pager: [phone]  Patient name: Jacob [name] Medical record number: [id number] Date of birth: 24-Feb-1951 Age: 68 y.o. Gender: male  Primary Care Provider: [name], NP Consultants: Cardiology, Nephrology Code Status: Full  Pt Overview and Major Events to Date:  9/27- admitted, NSTEMI 9/29-unable to cannulate AV fistula 9/30-plan to go to IR for temporary catheter for HD  Antibiotics Ceftriaxone (9/29-9/30) Doxycycline 9/29-9/30) Vancomycin (10/1-10/2) Cefepime (10/1-10/3) Cefdinir (10/3-) Assessment and Plan: Jacob [name] is a 68 y.o. male presenting with dyspnea, chest pain. PMH is significant for CHF, CAD, cardiomyopathy, HIV, DMT2, substance use disorder.  Community-acquired pneumonia Afebrile with last fever noted on 10/1.  Patient weaned to room air yesterday 10/4.  Appears to be breathing well on room air. -Antibiotics noted above -S/p vancomycin, cefepime -Cefdinir for a total of a 7-dose course with day 1 being the first day of vancomycin (10/1-10/9), cefdinir dosed every other day due to renal function with last day being 10/9.  New ESRD now on HD Now set up with outpatient dialysis on Monday, Wednesday, Friday.  Continues to receive dialysis through his temporary catheter until more permanent access can be obtained.  Plan for patient to receive dialysis and patient this morning.  Approximately 1 PM patient will go for IR to place a tunneled HD catheter.  -Nephrology following, appreciate recommendations -Vascular surgery following, appreciate recommendations -Calcitriol  NSTEMI  H/o CAD s/p CABG 2004 No chest pain currently.  Cardiology not planning for any intervention for this hospitalization.  Cards appointment scheduled for 10/28.  -ASA 81 daily -Atorvastatin 80 mg daily -Carvedilol 25 mg twice daily -Isosorbide mononitrate 30 mg daily -Heparin SQ  Anemia of Chronic Disease  H/O GI  Bleed (01/2019)-stable Hemoglobin 10/5of 8.2.  FOBT positive.  Plan to follow-up with GI outpatient does not appear to be an acute bleed. - Ferric gluconate infusion plan for next dialysis session - Patient received 1 unit RBCs on 10/1 - Transfusion threshold 8 - Darbepoetin every Friday  Polysubstance Use Disorder Intermittent cocaine use.  UDS positive for opioids.  Smoking history. - Nicotine patch PRN - encourage cessation of drug use  Elevated LFTs-resolved No further work-up at this time.  T2DM: CBGs 140s-220s/24 hours.  - continue sensitive SSI -Monitor CBGs  COPD Has 45-pack-year smoking history.  He smokes a pack per day.   -Anoro Ellipta  HTN Last blood pressure 112/72.  Trend over last 24 hours 104-124/46-72 -Carvedilol as above -Hydralazine 25 mg 3 times daily  HIV Home med is Biktarvy. HIV-1 RNA quantification was not detectable on 06/24/2019. He follows with Dr. [name] at [facility]. Last appt 07/08/2019. Good control. -Biktarvy daily  Gout -allopurinol  HLD -Atorvastatin 80 mg daily  FEN/GI: heart healthy/carb modified/renal diet PPx: Sub-q heparin for ppx  Disposition: Home pending clinical progression and more permanent vascular access for ongoing HD.  Subjective:  Jacob [name] complains of diarrhea of around 3-4 bouts last night.  He states that he does not have abdominal pain but his stomach feels "queasy".  He appears to be breathing well on room air.  Objective: Temp:  [97.8 F (36.6 C)-98.2 F (36.8 C)] 98.2 F (36.8 C) (10/05 0758) Pulse Rate:  [64-74] 66 (10/05 0758) Resp:  [17-21] 21 (10/05 0758) BP: (104-124)/(46-72) 123/62 (10/05 0758) SpO2:  [94 %-98 %] 96 % (10/05 0758) Weight:  [87 kg] 87 kg (10/05 0500)   Physical Exam: General: Alert and oriented in no apparent distress Heart:  Regular rate and rhythm with no murmurs appreciated Lungs: Mild crackles and expiratory wheezing.  Breathing well on room air. Abdomen: Bowel sounds  hyperactive, no abdominal pain   Laboratory: Recent Labs  Lab 07/27/19 0322 07/28/19 1326 07/29/19 0357  WBC 6.1 5.5 6.7  HGB 8.4* 8.1* 8.2*  HCT 25.6* 23.8* 24.4*  PLT 147* 176 205   Recent Labs  Lab 07/23/19 0608  07/27/19 0322 07/28/19 1326 07/29/19 0357  NA 138   < > 127* 129* 129*  K 4.5   < > 3.6 3.2* 3.6  CL 106   < > 90* 92* 93*  CO2 22   < > 23 20* 18*  BUN 80*   < > 57* 100* 117*  CREATININE 5.99*   < > 5.29* 8.15* 8.94*  CALCIUM 9.3   < > 7.9* 8.1* 8.0*  PROT 7.6  --   --   --   --   BILITOT 1.2  --   --   --   --   ALKPHOS 43  --   --   --   --   ALT 34  --   --   --   --   AST 47*  --   --   --   --   GLUCOSE 128*   < > 228* 217* 246*   < > = values in this interval not displayed.    Imaging/Diagnostic Tests: No results found.  Lurline Del, DO 07/29/2019, 9:12 AM PGY-2, [place] Intern pager: [phone], text pages welcome

## 2019-07-29 NOTE — Progress Notes (Signed)
Called by RN stating that patient very upset that IR procedure cancelled today after being NPO all day.  Went to see patient, he was upset that the procedure was cancelled after being told dit would be done today.  He was not aggressive but did state that he felt that he was not being treated well for the past week.  After listening to his concerns, I spoke with the nurse to help him order a meal. Will follow up in am re IR procedure and time.  Pt will be NPO after midnight.  Carollee Leitz MD

## 2019-07-29 NOTE — Progress Notes (Signed)
Pt came back from Dialysis and became very agitated when he was told that he is still NPO for procedure. Pt refused all medications and said that he wants to talk with head Dr or head nurse. Notified family medicine Dr and was advised continue to keep pt NPO. Call IR to check his schedule, found out he will be going around 3pm.

## 2019-07-29 NOTE — Progress Notes (Signed)
Pt Is moaning, saying "help me" frequently upon assessment pt complains  GI discomfort active sounds noted pt last BM was this AM patient also complains hand pain, tylenol given and simethicone still pt moaning. Asked what's wrong, pt stated " pain wise I am OK" Pt lay  back and refused to answer my question for further assessment. Will monitor

## 2019-07-29 NOTE — Progress Notes (Signed)
Patient needs conversion of left radiocephalic to brachiocephalic AV fistula.  He wanted to be discharged home and have this arranged as an outpatient after discussion over the weekend.  Dr. Oneida Alar paged today that patient reportedly change his mind.  Will post for Thursday.  Marty Heck, MD [facility] Office: [phone] Pager: [place]

## 2019-07-29 NOTE — Progress Notes (Signed)
Inpatient Diabetes Program Recommendations  AACE/ADA: New Consensus Statement on Inpatient Glycemic Control (2015)  Target Ranges:  Prepandial:   less than 140 mg/dL      Peak postprandial:   less than 180 mg/dL (1-2 hours)      Critically ill patients:  140 - 180 mg/dL   Lab Results  Component Value Date   GLUCAP 232 (H) 07/29/2019   HGBA1C 9.2 (H) 06/04/2019    Review of Glycemic Control Results for REDMOND, WHITTLEY" (MRN [id number]) as of 07/29/2019 09:46  Ref. Range 07/28/2019 11:30 07/28/2019 17:08 07/28/2019 21:19 07/29/2019 06:30  Glucose-Capillary Latest Ref Range: 70 - 99 mg/dL 220 (H) 207 (H) 146 (H) 232 (H)   Diabetes history: Type 2 DM Outpatient Diabetes medications: Toujeo 88 units QD, Humalog 20 units TID Current orders for Inpatient glycemic control: Novolog 0-9 units TID  Inpatient Diabetes Program Recommendations:    Consider adding back portion of patient's basal:  -Levemir 18 units QD.   Thanks, Bronson Curb, MSN, RNC-OB Diabetes Coordinator [phone] (8a-5p)

## 2019-07-29 NOTE — Progress Notes (Signed)
[name], male   DOB: 02-23-1951, 68 y.o.   MRN: [id number] [facility] Progress Note   Assessment/ Plan:   1.  Acute exacerbation of diastolic heart failure:  Secondary to recent cocaine use and indiscretions with sodium/fluid intake.  Continue hemodialysis for volume management with outpatient follow-up with cardiology as planned for possible coronary angiography when volume status optimized.  2.    End-stage renal disease from progressive chronic kidney disease stage V:  Started on hemodialysis this hospitalization for mild uremic symptoms with diuresis refractory volume overload.  Unsuccessful using his left RCF and had right IJ temporary catheter for dialysis placed by IR.   - HD today and per MWF schedule  - Convert to tunneled dialysis catheter today  - Consulted vascular surgery for permanent access.  Appreciate their assistance.  Contacted vascular - He has been accepted to [facility] on a Monday/Wednesday/Friday dialysis schedule   3.  Anemia of chronic kidney disease: Denies overt loss and note recent records significant for GI bleeding with AVMs.  Continue IV iron.  Hasn't yet gotten ESA.  Start aranesp 10/5  4.  Secondary hyperparathyroidism: Calcium and phosphorus levels currently acceptable, continue calcitriol for PTH suppression.  5.  HIV infection: antiretroviral therapy per primary team   6.  Polysubstance abuse: Encouraged efforts at cessation of cocaine use.  7.  Fever: Currently afebrile.  Suspected to be a respiratory origin with pneumonia-now on oral Omnicef from vancomycin/cefepime.  Subjective:   Seen on HD at 9:46.  nontunneled catheter in use with BP 137/73 and HR 66.  Procedure supervised.  Hasn't been down for his tunneled line yet.  He states that he thought he would be getting the fistula here before discharge.   Review of systems:  Denies n/v No chest pain or shortness of breath    Objective:   BP 136/67 (BP Location: Right Arm)    Pulse 64   Temp 97.9 F (36.6 C) (Oral)   Resp 18   Ht 6\' 2"  (1.88 m)   Wt 87.9 kg   SpO2 93%   BMI 24.88 kg/m   Intake/Output Summary (Last 24 hours) at 07/29/2019 0945 Last data filed at 07/29/2019 0900 Gross per 24 hour  Intake 480 ml  Output 100 ml  Net 380 ml   Weight change: 0.272 kg  Physical Exam:  Gen: adult male in bed in NAD at rest CVS: Pulse regular rhythm and normal rate, S1 and S2 normal Resp: Clear to auscultation bilaterally without distinct rales or rhonchi Abd: Soft, obese, nontender, bowel sounds normal Ext: Trace lower extremity edema Access left RCF with palpable thrill.  Right chest temporary dialysis catheter  Imaging: Vas US Duplex Dialysis Access (avf, Avg)  Result Date: 07/27/2019 DIALYSIS ACCESS Reason for Exam: Mechanical complication AVF. Access Site: Left Upper Extremity. Access Type: Radial-cephalic AVF. Limitations: Poor patient cooperation- patient having stomach pains. Comparison Study: 12/18/2018- dialysis access duplex Performing Technologist: Maudry Mayhew MHA, RDMS, RVT, RDCS  Examination Guidelines: A complete evaluation includes B-mode imaging, spectral Doppler, color Doppler, and power Doppler as needed of all accessible portions of each vessel. Unilateral testing is considered an integral part of a complete examination. Limited examinations for reoccurring indications may be performed as noted.  Findings: +--------------------+----------+-----------------+--------+ AVF                 PSV (cm/s)Flow Vol (mL/min)Comments +--------------------+----------+-----------------+--------+ Native artery inflow   207           377                +--------------------+----------+-----------------+--------+  AVF Anastomosis         53                              +--------------------+----------+-----------------+--------+  +------------+----------+-------------+----------+--------+ OUTFLOW VEINPSV (cm/s)Diameter (cm)Depth  (cm)Describe +------------+----------+-------------+----------+--------+ Mid Forearm    210                                    +------------+----------+-------------+----------+--------+ Dist Forearm   199                                    +------------+----------+-------------+----------+--------+   Summary: Flow volume is decreased when compared to prior study. No obvious evidence of competing branches in the mid to distal forearm.  *See table(s) above for measurements and observations.  Diagnosing physician: Monica Martinez MD Electronically signed by Monica Martinez MD on 07/27/2019 at 5:47:42 PM.   --------------------------------------------------------------------------------   Final     Labs: BMET Recent Labs  Lab 07/23/19 6503 07/24/19 0241 07/25/19 0008 07/26/19 5465 07/27/19 0322 07/28/19 1326 07/29/19 0357  NA 138 136 138 135 127* 129* 129*  K 4.5 4.6 4.3 4.1 3.6 3.2* 3.6  CL 106 104 103 98 90* 92* 93*  CO2 22 17* 22 23 23  20* 18*  GLUCOSE 128* 147* 147* 152* 228* 217* 246*  BUN 80* 92* 77* 78* 57* 100* 117*  CREATININE 5.99* 5.70* 5.42* 6.45* 5.29* 8.15* 8.94*  CALCIUM 9.3 9.2 8.8* 8.4* 7.9* 8.1* 8.0*  PHOS  --  3.7 4.6  --   --   --   --    CBC Recent Labs  Lab 07/25/19 1421 07/26/19 0610 07/27/19 0322 07/28/19 1326 07/29/19 0357  WBC 6.8  --  6.1 5.5 6.7  NEUTROABS  --   --   --   --  4.4  HGB 7.5* 8.0* 8.4* 8.1* 8.2*  HCT 23.3* 24.9* 25.6* 23.8* 24.4*  MCV 99.6  --  97.0 94.8 96.1  PLT 164  --  147* 176 205   Medications:    . allopurinol  100 mg Oral Daily  . aspirin EC  81 mg Oral Daily  . atorvastatin  80 mg Oral Daily  . bictegravir-emtricitabine-tenofovir AF  1 tablet Oral QPM  . calcitRIOL  0.25 mcg Oral Daily  . carvedilol  25 mg Oral BID WC  . cefdinir  300 mg Oral QODAY  . Chlorhexidine Gluconate Cloth  6 each Topical Q0600  . [START ON 08/02/2019] darbepoetin (ARANESP) injection - DIALYSIS  60 mcg Intravenous Q Fri-HD  .  diclofenac sodium  2 g Topical QID  . gabapentin  300 mg Oral QHS  . heparin injection (subcutaneous)  5,000 Units Subcutaneous Q8H  . hydrALAZINE  25 mg Oral TID  . insulin aspart  0-9 Units Subcutaneous TID WC  . isosorbide mononitrate  30 mg Oral Daily  . pantoprazole  40 mg Oral Daily  . umeclidinium-vilanterol  1 puff Inhalation Daily   Claudia Desanctis 07/29/2019, 9:45 AM

## 2019-07-29 NOTE — Progress Notes (Signed)
Page to MD:  3J00 [place] please call re CDiff order/IP advice.   MD notified of advice to refrain from Cdiff testing and enteric precautions.   ID MD pager number provided to MD for further evaluation/discussion. [phone].  Awaiting update from MD for next steps.

## 2019-07-30 ENCOUNTER — Inpatient Hospital Stay (HOSPITAL_COMMUNITY): Payer: Medicare Other

## 2019-07-30 ENCOUNTER — Encounter (HOSPITAL_COMMUNITY): Payer: Self-pay | Admitting: Interventional Radiology

## 2019-07-30 HISTORY — PX: IR US GUIDE VASC ACCESS RIGHT: IMG2390

## 2019-07-30 HISTORY — PX: IR FLUORO GUIDE CV LINE RIGHT: IMG2283

## 2019-07-30 LAB — CBC
HCT: 29.3 % — ABNORMAL LOW (ref 39.0–52.0)
Hemoglobin: 9.7 g/dL — ABNORMAL LOW (ref 13.0–17.0)
MCH: 31.8 pg (ref 26.0–34.0)
MCHC: 33.1 g/dL (ref 30.0–36.0)
MCV: 96.1 fL (ref 80.0–100.0)
Platelets: 280 10*3/uL (ref 150–400)
RBC: 3.05 MIL/uL — ABNORMAL LOW (ref 4.22–5.81)
RDW: 14.6 % (ref 11.5–15.5)
WBC: 7 10*3/uL (ref 4.0–10.5)
nRBC: 0 % (ref 0.0–0.2)

## 2019-07-30 LAB — CULTURE, BLOOD (ROUTINE X 2)
Culture: NO GROWTH
Culture: NO GROWTH
Special Requests: ADEQUATE
Special Requests: ADEQUATE

## 2019-07-30 LAB — RENAL FUNCTION PANEL
Albumin: 2.2 g/dL — ABNORMAL LOW (ref 3.5–5.0)
Anion gap: 17 — ABNORMAL HIGH (ref 5–15)
BUN: 99 mg/dL — ABNORMAL HIGH (ref 8–23)
CO2: 19 mmol/L — ABNORMAL LOW (ref 22–32)
Calcium: 8.4 mg/dL — ABNORMAL LOW (ref 8.9–10.3)
Chloride: 96 mmol/L — ABNORMAL LOW (ref 98–111)
Creatinine, Ser: 7.39 mg/dL — ABNORMAL HIGH (ref 0.61–1.24)
GFR calc Af Amer: 8 mL/min — ABNORMAL LOW (ref >60)
GFR calc non Af Amer: 7 mL/min — ABNORMAL LOW (ref >60)
Glucose, Bld: 155 mg/dL — ABNORMAL HIGH (ref 70–99)
Phosphorus: 7.5 mg/dL — ABNORMAL HIGH (ref 2.5–4.6)
Potassium: 3.9 mmol/L (ref 3.5–5.1)
Sodium: 132 mmol/L — ABNORMAL LOW (ref 135–145)

## 2019-07-30 LAB — GLUCOSE, CAPILLARY
Glucose-Capillary: 160 mg/dL — ABNORMAL HIGH (ref 70–99)
Glucose-Capillary: 149 mg/dL — ABNORMAL HIGH (ref 70–99)
Glucose-Capillary: 247 mg/dL — ABNORMAL HIGH (ref 70–99)
Glucose-Capillary: 215 mg/dL — ABNORMAL HIGH (ref 70–99)

## 2019-07-30 MED ORDER — LIDOCAINE HCL 1 % IJ SOLN
INTRAMUSCULAR | Status: AC | PRN
  Administered 2019-07-30: 10 mL

## 2019-07-30 MED ORDER — DARBEPOETIN ALFA 60 MCG/0.3ML IJ SOSY
60.0000 ug | PREFILLED_SYRINGE | INTRAMUSCULAR | Status: DC
  Administered 2019-07-31: 60 ug via INTRAVENOUS
  Filled 2019-07-30: qty 0.3

## 2019-07-30 MED ORDER — HEPARIN SODIUM (PORCINE) 1000 UNIT/ML IJ SOLN
INTRAMUSCULAR | Status: AC
  Filled 2019-07-30: qty 1

## 2019-07-30 MED ORDER — MIDAZOLAM HCL 2 MG/2ML IJ SOLN
INTRAMUSCULAR | Status: AC
  Filled 2019-07-30: qty 2

## 2019-07-30 MED ORDER — FENTANYL CITRATE (PF) 100 MCG/2ML IJ SOLN
INTRAMUSCULAR | Status: AC
  Filled 2019-07-30: qty 2

## 2019-07-30 MED ORDER — CEFAZOLIN SODIUM-DEXTROSE 2-4 GM/100ML-% IV SOLN
INTRAVENOUS | Status: AC
  Administered 2019-07-30: 11:00:00 2 g
  Filled 2019-07-30: qty 100

## 2019-07-30 MED ORDER — GELATIN ABSORBABLE 12-7 MM EX MISC
CUTANEOUS | Status: AC | PRN
  Administered 2019-07-30: 1 via TOPICAL

## 2019-07-30 MED ORDER — MIDAZOLAM HCL 2 MG/2ML IJ SOLN
INTRAMUSCULAR | Status: AC | PRN
  Administered 2019-07-30: 1 mg via INTRAVENOUS

## 2019-07-30 MED ORDER — HEPARIN SODIUM (PORCINE) 5000 UNIT/ML IJ SOLN
5000.0000 [IU] | Freq: Three times a day (TID) | INTRAMUSCULAR | Status: DC
  Administered 2019-07-31 – 2019-08-01 (×4): 5000 [IU] via SUBCUTANEOUS
  Filled 2019-07-30 (×4): qty 1

## 2019-07-30 MED ORDER — FENTANYL CITRATE (PF) 100 MCG/2ML IJ SOLN
INTRAMUSCULAR | Status: AC | PRN
  Administered 2019-07-30 (×2): 25 ug via INTRAVENOUS

## 2019-07-30 MED ORDER — HEPARIN SOD (PORK) LOCK FLUSH 100 UNIT/ML IV SOLN
INTRAVENOUS | Status: AC
  Filled 2019-07-30: qty 5

## 2019-07-30 MED ORDER — LIDOCAINE HCL 1 % IJ SOLN
INTRAMUSCULAR | Status: AC
  Filled 2019-07-30: qty 20

## 2019-07-30 MED ORDER — HEPARIN SODIUM (PORCINE) 1000 UNIT/ML IJ SOLN
INTRAMUSCULAR | Status: AC | PRN
  Administered 2019-07-30: 3.2 mL via INTRAVENOUS

## 2019-07-30 MED ORDER — GELATIN ABSORBABLE 12-7 MM EX MISC
CUTANEOUS | Status: AC
  Filled 2019-07-30: qty 1

## 2019-07-30 MED ORDER — CHLORHEXIDINE GLUCONATE 4 % EX LIQD
CUTANEOUS | Status: AC
  Filled 2019-07-30: qty 15

## 2019-07-30 MED ORDER — CHLORHEXIDINE GLUCONATE CLOTH 2 % EX PADS
6.0000 | MEDICATED_PAD | Freq: Every day | CUTANEOUS | Status: DC
  Administered 2019-07-31: 6 via TOPICAL

## 2019-07-30 NOTE — Progress Notes (Signed)
FPTS Interim Progress Note  Received page from nursing stating that patient was requesting to speak with MD regarding his HD despite nursing explaining schedule to patient.  Reported to bedside in order to answer questions for patient. Patient reports frustration with inconsistencies in treatment/procedure scheduling throughout this admission. I apologized for this and reassured the patient that he would have HD based on a MWF schedule and would have a procedure with vascular surgery on 10/8 per chart review of recent notes. Patient verbalized understanding seemed to calm down after repeatedly reviewing this information. Notified both the patient and nursing that the night team is available to answer questions overnight and that the day team is available throughout the day. Patient verbalized understanding of this as well and had no further questions.   Stark Klein, MD 07/30/2019, 11:35 PM PGY-1, [place] Medicine Service pager [phone]

## 2019-07-30 NOTE — Progress Notes (Signed)
Family Medicine Teaching Service Daily Progress Note Intern Pager: [phone]  Patient name: Jacob [name] Medical record number: [id number] Date of birth: [birth year] Age: 68 y.o. Gender: male  Primary Care Provider: [name], NP Consultants: Cardiology, Nephrology Code Status: Full  Pt Overview and Major Events to Date:  9/27- admitted, NSTEMI 9/29-unable to cannulate AV fistula 9/30-plan to go to IR for temporary catheter for HD  Antibiotics Ceftriaxone (9/29-9/30) Doxycycline 9/29-9/30) Vancomycin (10/1-10/2) Cefepime (10/1-10/3) Cefdinir (10/3-) Assessment and Plan: Jacob [name] is a 68 y.o. male presenting with dyspnea, chest pain. PMH is significant for CHF, CAD, cardiomyopathy, HIV, DMT2, substance use disorder.  Community-acquired pneumonia Afebrile with last fever noted on 10/1.  Patient weaned to room air 10/4.  Appears to be breathing well on room air. -Antibiotics noted above -S/p vancomycin, cefepime -Cefdinir for a total of a 7-dose course with day 1 being the first day of vancomycin (10/1-10/9), cefdinir dosed every other day due to renal function with last day being 10/9.  New ESRD now on HD Now set up with dialysis on Monday, Wednesday, Friday.  Continues to receive dialysis through his temporary catheter until more permanent access can be obtained.  Last dialysis yesterday 10/5.  Patient's IR procedure to place a tunneled HD catheter yesterday was cancelled. -IR planning to have patient's tunneled HD catheter procedure today. -Nephrology following, appreciate recommendations -Vascular surgery following, appreciate recommendations -Calcitriol  NSTEMI  H/o CAD s/p CABG 2004 No chest pain currently.  Cardiology not planning for any intervention for this hospitalization.  Cards appointment scheduled for 10/28.  -ASA 81 daily -Atorvastatin 80 mg daily -Carvedilol 25 mg twice daily -Isosorbide mononitrate 30 mg daily -Heparin SQ  Anemia of Chronic  Disease  H/O GI Bleed (01/2019)-stable Hemoglobin 10/5 of 8.2.  FOBT positive.  Plan to follow-up with GI outpatient does not appear to be an acute bleed. - Ferric gluconate infusion plan for next dialysis session - Patient received 1 unit RBCs on 10/1 - Transfusion threshold 8 - Darbepoetin every Friday  Polysubstance Use Disorder Intermittent cocaine use.  UDS positive for opioids.  Smoking history. - Nicotine patch PRN - encourage cessation of drug use  Elevated LFTs-resolved No further work-up at this time.  T2DM: CBGs 120s-230s/24 hours.  - continue sensitive SSI -Monitor CBGs  COPD Has 45-pack-year smoking history.  He smokes a pack per day.   -Anoro Ellipta  HTN Last blood pressure 117/56.  Trend over last 24 hours 112-143/49-74 -Carvedilol as above -Hydralazine 25 mg 3 times daily  HIV Home med is Biktarvy. HIV-1 RNA quantification was not detectable on 06/24/2019. He follows with Dr. [name] at [facility]. Last appt 07/08/2019. Good control. -Biktarvy daily  Gout -allopurinol  HLD -Atorvastatin 80 mg daily  FEN/GI: heart healthy/carb modified/renal diet PPx: Sub-q heparin for ppx  Disposition: Home pending clinical progression and more permanent vascular access for ongoing HD.  Subjective:  Patient expresses frustration this morning that his procedure yesterday had been canceled.  He states that he has things to do and he is concerned that he may miss his outpatient dialysis session on Wednesday.  He expresses concern that if he does he may not be able to continue dialysis at that center.  I explained the patient the purpose of the procedure and its importance for him to have his continued outpatient dialysis.  Additionally, patient states his diarrhea has improved with only a bout or 2 since last night.  He denies other complaints or issues.  Objective:  Temp:  [97.7 F (36.5 C)-98.5 F (36.9 C)] 97.8 F (36.6 C) (10/06 0313) Pulse Rate:  [63-74] 63  (10/06 0313) Resp:  [17-20] 17 (10/06 0313) BP: (112-143)/(49-74) 117/56 (10/06 0313) SpO2:  [93 %-96 %] 94 % (10/06 0313) Weight:  [84.9 kg-87.9 kg] 84.9 kg (10/06 0314)   Physical Exam: General: Alert and oriented in no apparent distress Heart: Regular rate and rhythm with no murmurs appreciated Lungs: Faint crackles noted in midlung bilaterally, improved from yesterday.  Even well on room air Abdomen: Bowel sounds present, no abdominal pain   Laboratory: Recent Labs  Lab 07/27/19 0322 07/28/19 1326 07/29/19 0357  WBC 6.1 5.5 6.7  HGB 8.4* 8.1* 8.2*  HCT 25.6* 23.8* 24.4*  PLT 147* 176 205   Recent Labs  Lab 07/27/19 0322 07/28/19 1326 07/29/19 0357  NA 127* 129* 129*  K 3.6 3.2* 3.6  CL 90* 92* 93*  CO2 23 20* 18*  BUN 57* 100* 117*  CREATININE 5.29* 8.15* 8.94*  CALCIUM 7.9* 8.1* 8.0*  GLUCOSE 228* 217* 246*    Imaging/Diagnostic Tests: No results found.  Lurline Del, DO 07/30/2019, 8:40 AM PGY-1, [facility] Intern pager: [phone], text pages welcome

## 2019-07-30 NOTE — Care Management Important Message (Signed)
Important Message  Patient Details  Name: Jacob [name] MRN: [id number] Date of Birth: May 25, 1951   Medicare Important Message Given:  Yes     Shelda Altes 07/30/2019, 3:19 PM

## 2019-07-30 NOTE — Progress Notes (Signed)
[name], male   DOB: July 22, 1951, 68 y.o.   MRN: [id number] [facility] Progress Note   Assessment/ Plan:   1.  Acute exacerbation of diastolic heart failure:  Secondary to recent cocaine use and indiscretions with sodium/fluid intake.  Continue hemodialysis for volume management with outpatient follow-up with cardiology as planned for possible coronary angiography when volume status optimized.  2.    End-stage renal disease from progressive chronic kidney disease stage V:  Started on hemodialysis this hospitalization for mild uremic symptoms with diuresis refractory volume overload.  Unsuccessful using his left RCF and had right IJ temporary catheter for dialysis placed by IR.  Tunneled catheter then placed per IR on 10/6 - HD per MWF schedule  - Consulted vascular surgery for permanent access.  Appreciate their assistance.  They plan for conversion of left radiocephalic to brachiocephalic AV fistula on 69/6.  - He has been accepted to [facility] on a Monday/Wednesday/Friday dialysis schedule   3.  Anemia of chronic kidney disease: Denies overt loss and note recent records significant for GI bleeding with AVMs.  Continue IV iron.  Ordered aranesp 10/5 and patient charted as refusing - states he didn't refuse.  Will reorder   4.  Secondary hyperparathyroidism: Calcium and phosphorus levels currently acceptable, continue calcitriol   5.  HIV infection: antiretroviral therapy per primary team   6.  Polysubstance abuse: Encouraged efforts at cessation of cocaine use.  7.  Fever: Currently afebrile.  Suspected to be a respiratory origin with pneumonia-now on oral Omnicef from vancomycin/cefepime.  Subjective:   He had a tunneled catheter placed earlier today.  No labs this AM.  Charted as refusing the ESA.  He states no hx of malignancy and is ok with receiving ESA  Review of systems:  Denies n/v No chest pain or shortness of breath    Objective:   BP (!) 147/69    Pulse 70   Temp 97.8 F (36.6 C) (Oral)   Resp 19   Ht 6\' 2"  (1.88 m)   Wt 84.9 kg   SpO2 96%   BMI 24.04 kg/m   Intake/Output Summary (Last 24 hours) at 07/30/2019 1147 Last data filed at 07/30/2019 0941 Gross per 24 hour  Intake 480 ml  Output 100 ml  Net 380 ml   Weight change: 0.9 kg  Physical Exam:  Gen: adult male in bed in NAD at rest CVS: Pulse regular rhythm and normal rate, S1 and S2 normal Resp: Clear to auscultation bilaterally without distinct rales or rhonchi Abd: Soft, obese, nontender, bowel sounds normal Ext: Trace lower extremity edema Access left RCF with palpable thrill.  Right chest tunneled dialysis catheter  Imaging: No results found.  Labs: BMET Recent Labs  Lab 07/24/19 0241 07/25/19 0008 07/26/19 0610 07/27/19 0322 07/28/19 1326 07/29/19 0357  NA 136 138 135 127* 129* 129*  K 4.6 4.3 4.1 3.6 3.2* 3.6  CL 104 103 98 90* 92* 93*  CO2 17* 22 23 23  20* 18*  GLUCOSE 147* 147* 152* 228* 217* 246*  BUN 92* 77* 78* 57* 100* 117*  CREATININE 5.70* 5.42* 6.45* 5.29* 8.15* 8.94*  CALCIUM 9.2 8.8* 8.4* 7.9* 8.1* 8.0*  PHOS 3.7 4.6  --   --   --   --    CBC Recent Labs  Lab 07/25/19 1421 07/26/19 0610 07/27/19 0322 07/28/19 1326 07/29/19 0357  WBC 6.8  --  6.1 5.5 6.7  NEUTROABS  --   --   --   --  4.4  HGB 7.5* 8.0* 8.4* 8.1* 8.2*  HCT 23.3* 24.9* 25.6* 23.8* 24.4*  MCV 99.6  --  97.0 94.8 96.1  PLT 164  --  147* 176 205   Medications:    . allopurinol  100 mg Oral Daily  . aspirin EC  81 mg Oral Daily  . atorvastatin  80 mg Oral Daily  . bictegravir-emtricitabine-tenofovir AF  1 tablet Oral QPM  . calcitRIOL  0.25 mcg Oral Daily  . carvedilol  25 mg Oral BID WC  . cefdinir  300 mg Oral QODAY  . chlorhexidine      . Chlorhexidine Gluconate Cloth  6 each Topical Q0600  . darbepoetin (ARANESP) injection - DIALYSIS  100 mcg Intravenous Q Mon-HD  . diclofenac sodium  2 g Topical QID  . fentaNYL      . gabapentin  300 mg Oral QHS   . gelatin adsorbable      . heparin      . [START ON 07/31/2019] heparin injection (subcutaneous)  5,000 Units Subcutaneous Q8H  . heparin lock flush      . hydrALAZINE  25 mg Oral TID  . insulin aspart  0-9 Units Subcutaneous TID WC  . isosorbide mononitrate  30 mg Oral Daily  . lidocaine      . midazolam      . pantoprazole  40 mg Oral Daily  . umeclidinium-vilanterol  1 puff Inhalation Daily   Claudia Desanctis 07/30/2019, 11:47 AM

## 2019-07-30 NOTE — Procedures (Signed)
Interventional Radiology Procedure Note  Procedure: Placement of a right IJ approach tunneled HD catheter 19cm tip to cuff.  Tip is positioned at the superior cavoatrial junction and catheter is ready for immediate use.  Complications: None Recommendations:  - Ok to use - Do not submerge - Routine line care   Signed,  Zilphia Kozinski S. Marque Bango, DO    

## 2019-07-31 LAB — RENAL FUNCTION PANEL
Albumin: 2 g/dL — ABNORMAL LOW (ref 3.5–5.0)
Anion gap: 17 — ABNORMAL HIGH (ref 5–15)
BUN: 117 mg/dL — ABNORMAL HIGH (ref 8–23)
CO2: 18 mmol/L — ABNORMAL LOW (ref 22–32)
Calcium: 8.4 mg/dL — ABNORMAL LOW (ref 8.9–10.3)
Chloride: 96 mmol/L — ABNORMAL LOW (ref 98–111)
Creatinine, Ser: 7.77 mg/dL — ABNORMAL HIGH (ref 0.61–1.24)
GFR calc Af Amer: 7 mL/min — ABNORMAL LOW (ref >60)
GFR calc non Af Amer: 6 mL/min — ABNORMAL LOW (ref >60)
Glucose, Bld: 202 mg/dL — ABNORMAL HIGH (ref 70–99)
Phosphorus: 8.2 mg/dL — ABNORMAL HIGH (ref 2.5–4.6)
Potassium: 4.1 mmol/L (ref 3.5–5.1)
Sodium: 131 mmol/L — ABNORMAL LOW (ref 135–145)

## 2019-07-31 LAB — GLUCOSE, CAPILLARY
Glucose-Capillary: 190 mg/dL — ABNORMAL HIGH (ref 70–99)
Glucose-Capillary: 233 mg/dL — ABNORMAL HIGH (ref 70–99)
Glucose-Capillary: 134 mg/dL — ABNORMAL HIGH (ref 70–99)
Glucose-Capillary: 187 mg/dL — ABNORMAL HIGH (ref 70–99)
Glucose-Capillary: 160 mg/dL — ABNORMAL HIGH (ref 70–99)

## 2019-07-31 LAB — CBC
HCT: 25.8 % — ABNORMAL LOW (ref 39.0–52.0)
Hemoglobin: 8.8 g/dL — ABNORMAL LOW (ref 13.0–17.0)
MCH: 32 pg (ref 26.0–34.0)
MCHC: 34.1 g/dL (ref 30.0–36.0)
MCV: 93.8 fL (ref 80.0–100.0)
Platelets: 284 10*3/uL (ref 150–400)
RBC: 2.75 MIL/uL — ABNORMAL LOW (ref 4.22–5.81)
RDW: 14.6 % (ref 11.5–15.5)
WBC: 7 10*3/uL (ref 4.0–10.5)
nRBC: 0 % (ref 0.0–0.2)

## 2019-07-31 MED ORDER — HEPARIN SODIUM (PORCINE) 1000 UNIT/ML IJ SOLN
INTRAMUSCULAR | Status: AC
  Administered 2019-07-31: 3200 [IU]
  Filled 2019-07-31: qty 4

## 2019-07-31 MED ORDER — SEVELAMER CARBONATE 800 MG PO TABS
800.0000 mg | ORAL_TABLET | Freq: Three times a day (TID) | ORAL | Status: DC
  Administered 2019-07-31 – 2019-08-01 (×2): 800 mg via ORAL
  Filled 2019-07-31 (×2): qty 1

## 2019-07-31 MED ORDER — DARBEPOETIN ALFA 60 MCG/0.3ML IJ SOSY
PREFILLED_SYRINGE | INTRAMUSCULAR | Status: AC
  Filled 2019-07-31: qty 0.3

## 2019-07-31 MED ORDER — VANCOMYCIN HCL IN DEXTROSE 1-5 GM/200ML-% IV SOLN
1000.0000 mg | INTRAVENOUS | Status: AC
  Administered 2019-08-01: 1000 mg via INTRAVENOUS
  Filled 2019-07-31 (×2): qty 200

## 2019-07-31 NOTE — Progress Notes (Signed)
Patient going to HD today- called down to dialysis - charge nurse said to hold medications for BP this morning

## 2019-07-31 NOTE — Progress Notes (Signed)
Chaplain provided follow-up visit to [name].  [name] expressed that dialysis did not go well and that he was trying to get some rest.  [name] also explained during visit that he does not feel the staff is listening to him, a repeated statement from last visit.  On this visit, [name] expressed wanting ice-cream or a cold icy drink, but was given a cup of ice. Chaplain found nurse who was able to give him the ice-cream he requested.  Chaplain provided spiritual support and presence.  Chaplain will continue to follow-up and advocate on [name]'s behalf.

## 2019-07-31 NOTE — Progress Notes (Signed)
Patient BP is low and was given orders to hold scheduled gabapentin and hydralazine for tonight and to recheck BP in an hour.

## 2019-07-31 NOTE — Progress Notes (Signed)
Renal Navigator met with patient at HD bedside. He states he is having AVF conversion procedure tomorrow and is eager to discharge home as soon as possible afterwards. In anticipation of discharge after procedure tomorrow, 08/01/19, Renal Navigator discussed patient's next HD session being at [facility] OP HD clinic on Friday 08/02/19. This is what patient is hoping for. Renal Navigator offered to schedule [facility] transportation for this appointment and patient agreed. Renal Navigator also arranged SCAT for OP HD appointments next Monday and Wednesday also. SCAT ride times are as follows: Friday, 08/02/19: pick up at patient's home between 11:30am-12:00pm for ride to OP HD clinic/[facility]. Pick up at clinic between 5:45pm-6:15pm for ride back home. Monday, 08/05/19: pick up at patient's home between 12:00pm-12:30pm for ride to OP HD clinic/[facility]. Pick up at clinic between 5:45pm-6:15pm for ride back home. Wednesday, 08/07/19: pick up at patient's home between 12:00pm-12:30pm for ride to OP HD clinic/[facility]. Pick up at clinic between 5:45pm-6:15pm for ride back home. SCAT ride schedule given to patient in writing. Patient appreciative.  Alphonzo Cruise, [place] Renal Navigator [phone]

## 2019-07-31 NOTE — H&P (View-Only) (Signed)
    NPO past MN for brachiocephalic left UE 46/0/0298  Roxy Horseman PA-C

## 2019-07-31 NOTE — Progress Notes (Signed)
    NPO past MN for brachiocephalic left UE 06/0/1561  Roxy Horseman PA-C

## 2019-07-31 NOTE — Progress Notes (Signed)
[name], male   DOB: 1951-02-07, 68 y.o.   MRN: [id number] [facility] Progress Note   Assessment/ Plan:   1.  Acute exacerbation of diastolic heart failure:  Secondary to recent cocaine use and indiscretions with sodium/fluid intake.  Continue hemodialysis for volume management with outpatient follow-up with cardiology as planned for possible coronary angiography when volume status optimized.  2.    End-stage renal disease from progressive chronic kidney disease stage V:  Started on hemodialysis this hospitalization for mild uremic symptoms with diuresis refractory volume overload.  Unsuccessful using his left RCF and had right IJ temporary catheter for dialysis placed by IR.  Tunneled catheter then placed per IR on 10/6 - HD per MWF schedule  - Consulted vascular surgery for permanent access.  Appreciate their assistance.  They plan for conversion of left radiocephalic to brachiocephalic AV fistula on 70/0 - he is NPO after midnight tonight for same  - He has been accepted to [facility] on a Monday/Wednesday/Friday dialysis schedule   3.  Anemia of chronic kidney disease: Denies overt loss and note recent records significant for GI bleeding with AVMs.  Continue IV iron.  aranesp re-ordered   4.  Secondary hyperparathyroidism:  hyperphos - hopeful for improved clearance with tunneled catheter.  Start renvela for now.  continue calcitriol    5.  HIV infection: antiretroviral therapy per primary team   6.  Polysubstance abuse: Encouraged efforts at cessation of cocaine use.  7.  Fever: Currently afebrile.  Suspected to be a respiratory origin with pneumonia-now on oral Omnicef from vancomycin/cefepime.   Disposition - awaiting conversion of left radiocephalic to brachiochepalic AVF  Subjective:   Spoke with patient.  He states his team stated that he would be discharged.  Vascular has ordered NPO after midnight for surgery tomorrow.  Spoke with his team.    Review  of systems:  Denies n/v No chest pain or shortness of breath    Objective:   BP (!) 130/55 (BP Location: Right Arm)   Pulse 65   Temp 98.4 F (36.9 C) (Oral)   Resp 17   Ht '6\' 2"'  (1.88 m)   Wt 85.6 kg   SpO2 98%   BMI 24.23 kg/m   Intake/Output Summary (Last 24 hours) at 07/31/2019 1126 Last data filed at 07/31/2019 0849 Gross per 24 hour  Intake 480 ml  Output 1 ml  Net 479 ml   Weight change:   Physical Exam:  Gen: adult male in bed in NAD at rest CVS: Pulse regular rhythm and normal rate, S1 and S2 normal Resp: Clear to auscultation bilaterally without distinct rales or rhonchi Abd: Soft, obese, nontender, bowel sounds normal Ext: no lower extremity edema Access left RCF with palpable thrill.  Right chest tunneled dialysis catheter  Imaging: Ir Fluoro Guide Cv Line Right  Result Date: 07/30/2019 INDICATION: 68 year old male with a history of renal failure EXAM: IMAGE GUIDED PLACEMENT OF TUNNELED HEMODIALYSIS CATHETER MEDICATIONS: 2 g Ancef; The antibiotic was administered within an appropriate time interval prior to skin puncture. ANESTHESIA/SEDATION: Moderate (conscious) sedation was employed during this procedure. A total of Versed 1.0 mg and Fentanyl 50 mcg was administered intravenously. Moderate Sedation Time: 14 minutes. The patient's level of consciousness and vital signs were monitored continuously by radiology nursing throughout the procedure under my direct supervision. FLUOROSCOPY TIME:  Fluoroscopy Time: 1 minutes 0 seconds (7 mGy). COMPLICATIONS: None PROCEDURE: Informed written consent was obtained from the patient after a discussion  of the risks, benefits, and alternatives to treatment. Questions regarding the procedure were encouraged and answered. The right neck and chest were prepped with chlorhexidine in a sterile fashion, and a sterile drape was applied covering the operative field. Maximum barrier sterile technique with sterile gowns and gloves were used for  the procedure. A timeout was performed prior to the initiation of the procedure. After creating a small venotomy incision, a micropuncture kit was utilized to access the right internal jugular vein under direct, real-time ultrasound guidance after the overlying soft tissues were anesthetized with 1% lidocaine with epinephrine. Ultrasound image documentation was performed. The microwire was marked to measure appropriate internal catheter length. External tunneled length was estimated. A total tip to cuff length of 19 cm was selected. Skin and subcutaneous tissues of chest wall below the clavicle were generously infiltrated with 1% lidocaine for local anesthesia. A small stab incision was made with 11 blade scalpel. The selected hemodialysis catheter was tunneled in a retrograde fashion from the anterior chest wall to the venotomy incision. A guidewire was advanced to the level of the IVC and the micropuncture sheath was exchanged for a peel-away sheath. The catheter was then placed through the peel-away sheath with tips ultimately positioned within the superior aspect of the right atrium. Final catheter positioning was confirmed and documented with a spot radiographic image. The catheter aspirates and flushes normally. The catheter was flushed with appropriate volume heparin dwells. The catheter exit site was secured with a 0-Prolene retention suture. The venotomy incision was closed Derma bond and sterile dressing. Dressings were applied at the chest wall. Patient tolerated the procedure well and remained hemodynamically stable throughout. No complications were encountered and no significant blood loss encountered. IMPRESSION: Status post right IJ tunneled hemodialysis catheter placement. Catheter ready for use. Signed, Dulcy Fanny. Dellia Nims, RPVI [facility] Radiology Electronically Signed   By: Corrie Mckusick D.O.   On: 07/30/2019 12:31   Ir US Guide Vasc Access  Right  Result Date: 07/30/2019 INDICATION: 68 year old male with a history of renal failure EXAM: IMAGE GUIDED PLACEMENT OF TUNNELED HEMODIALYSIS CATHETER MEDICATIONS: 2 g Ancef; The antibiotic was administered within an appropriate time interval prior to skin puncture. ANESTHESIA/SEDATION: Moderate (conscious) sedation was employed during this procedure. A total of Versed 1.0 mg and Fentanyl 50 mcg was administered intravenously. Moderate Sedation Time: 14 minutes. The patient's level of consciousness and vital signs were monitored continuously by radiology nursing throughout the procedure under my direct supervision. FLUOROSCOPY TIME:  Fluoroscopy Time: 1 minutes 0 seconds (7 mGy). COMPLICATIONS: None PROCEDURE: Informed written consent was obtained from the patient after a discussion of the risks, benefits, and alternatives to treatment. Questions regarding the procedure were encouraged and answered. The right neck and chest were prepped with chlorhexidine in a sterile fashion, and a sterile drape was applied covering the operative field. Maximum barrier sterile technique with sterile gowns and gloves were used for the procedure. A timeout was performed prior to the initiation of the procedure. After creating a small venotomy incision, a micropuncture kit was utilized to access the right internal jugular vein under direct, real-time ultrasound guidance after the overlying soft tissues were anesthetized with 1% lidocaine with epinephrine. Ultrasound image documentation was performed. The microwire was marked to measure appropriate internal catheter length. External tunneled length was estimated. A total tip to cuff length of 19 cm was selected. Skin and subcutaneous tissues of chest wall below the clavicle were generously infiltrated with 1%  lidocaine for local anesthesia. A small stab incision was made with 11 blade scalpel. The selected hemodialysis catheter was tunneled in a retrograde fashion from the  anterior chest wall to the venotomy incision. A guidewire was advanced to the level of the IVC and the micropuncture sheath was exchanged for a peel-away sheath. The catheter was then placed through the peel-away sheath with tips ultimately positioned within the superior aspect of the right atrium. Final catheter positioning was confirmed and documented with a spot radiographic image. The catheter aspirates and flushes normally. The catheter was flushed with appropriate volume heparin dwells. The catheter exit site was secured with a 0-Prolene retention suture. The venotomy incision was closed Derma bond and sterile dressing. Dressings were applied at the chest wall. Patient tolerated the procedure well and remained hemodynamically stable throughout. No complications were encountered and no significant blood loss encountered. IMPRESSION: Status post right IJ tunneled hemodialysis catheter placement. Catheter ready for use. Signed, Dulcy Fanny. Dellia Nims, RPVI [facility] Radiology Electronically Signed   By: Corrie Mckusick D.O.   On: 07/30/2019 12:31    Labs: BMET Recent Labs  Lab 07/25/19 0008 07/26/19 0610 07/27/19 0322 07/28/19 1326 07/29/19 0357 07/30/19 1219 07/31/19 0318  NA 138 135 127* 129* 129* 132* 131*  K 4.3 4.1 3.6 3.2* 3.6 3.9 4.1  CL 103 98 90* 92* 93* 96* 96*  CO2 '22 23 23 ' 20* 18* 19* 18*  GLUCOSE 147* 152* 228* 217* 246* 155* 202*  BUN 77* 78* 57* 100* 117* 99* 117*  CREATININE 5.42* 6.45* 5.29* 8.15* 8.94* 7.39* 7.77*  CALCIUM 8.8* 8.4* 7.9* 8.1* 8.0* 8.4* 8.4*  PHOS 4.6  --   --   --   --  7.5* 8.2*   CBC Recent Labs  Lab 07/28/19 1326 07/29/19 0357 07/30/19 1219 07/31/19 0318  WBC 5.5 6.7 7.0 7.0  NEUTROABS  --  4.4  --   --   HGB 8.1* 8.2* 9.7* 8.8*  HCT 23.8* 24.4* 29.3* 25.8*  MCV 94.8 96.1 96.1 93.8  PLT 176 205 280 284   Medications:    . allopurinol  100 mg Oral Daily  . aspirin EC  81 mg Oral Daily  .  atorvastatin  80 mg Oral Daily  . bictegravir-emtricitabine-tenofovir AF  1 tablet Oral QPM  . calcitRIOL  0.25 mcg Oral Daily  . carvedilol  25 mg Oral BID WC  . cefdinir  300 mg Oral QODAY  . Chlorhexidine Gluconate Cloth  6 each Topical Q0600  . Chlorhexidine Gluconate Cloth  6 each Topical Q0600  . darbepoetin (ARANESP) injection - DIALYSIS  60 mcg Intravenous Q Wed-HD  . diclofenac sodium  2 g Topical QID  . gabapentin  300 mg Oral QHS  . heparin injection (subcutaneous)  5,000 Units Subcutaneous Q8H  . hydrALAZINE  25 mg Oral TID  . insulin aspart  0-9 Units Subcutaneous TID WC  . isosorbide mononitrate  30 mg Oral Daily  . pantoprazole  40 mg Oral Daily  . umeclidinium-vilanterol  1 puff Inhalation Daily   Claudia Desanctis 07/31/2019, 11:26 AM

## 2019-07-31 NOTE — Progress Notes (Signed)
Family Medicine Teaching Service Daily Progress Note Intern Pager: [phone]  Patient name: Jacob [name] Medical record number: [id number] Date of birth: [birth year] Age: 68 y.o. Gender: male  Primary Care Provider: [name], NP Consultants: Cardiology, Nephrology Code Status: Full  Pt Overview and Major Events to Date:  9/27- admitted, NSTEMI 9/29-unable to cannulate AV fistula 9/30-plan to go to IR for temporary catheter for HD  Antibiotics Ceftriaxone (9/29-9/30) Doxycycline 9/29-9/30) Vancomycin (10/1-10/2) Cefepime (10/1-10/3) Cefdinir (10/3-) Assessment and Plan: Jacob [name] is a 68 y.o. male presenting with dyspnea, chest pain. PMH is significant for CHF, CAD, cardiomyopathy, HIV, DMT2, substance use disorder.  Community-acquired pneumonia Afebrile with last fever noted on 10/1.  Patient weaned to room air 10/4.  Appears to be breathing well on room air. -Antibiotics noted above -S/p vancomycin, cefepime -Cefdinir for a total of a 7-dose course with day 1 being the first day of vancomycin (10/1-10/9), cefdinir dosed every other day due to renal function with last day being 10/9.  New ESRD now on HD Now set up with dialysis on Monday, Wednesday, Friday.  Patient received a tunneled HD catheter yesterday via IR.  Next dialysis planned for this morning.  Patient is scheduled for left radiocephalic and brachiocephalic AV fistula procedure on Thursday, 10/8.  This is an outpatient procedure but will be performed at the hospital. -Nephrology following, appreciate recommendations -Calcitriol  NSTEMI  H/o CAD s/p CABG 2004 No chest pain currently.  Cardiology not planning for any intervention for this hospitalization.  Cards appointment scheduled for 10/28.  -ASA 81 daily -Atorvastatin 80 mg daily -Carvedilol 25 mg twice daily -Isosorbide mononitrate 30 mg daily -Heparin SQ  Anemia of Chronic Disease  H/O GI Bleed (01/2019)-stable Hemoglobin 10/5 of 8.2.   FOBT positive.  Plan to follow-up with GI outpatient does not appear to be an acute bleed. - Ferric gluconate infusion plan for next dialysis session - Patient received 1 unit RBCs on 10/1 - Transfusion threshold 8 - Darbepoetin every Friday  Polysubstance Use Disorder Intermittent cocaine use.  UDS positive for opioids.  Smoking history. - Nicotine patch PRN - encourage cessation of drug use  Elevated LFTs-resolved No further work-up at this time.  T2DM: CBGs 140s-240s/24 hours.  - continue sensitive SSI -Monitor CBGs  COPD Has 45-pack-year smoking history.  He smokes a pack per day.   -Anoro Ellipta  HTN Last blood pressure 117/56.  Trend over last 24 hours 112-143/49-74 -Carvedilol as above -Hydralazine 25 mg 3 times daily  HIV Home med is Biktarvy. HIV-1 RNA quantification was not detectable on 06/24/2019. He follows with Dr. [name] at [facility]. Last appt 07/08/2019. Good control. -Biktarvy daily  Gout -allopurinol  HLD -Atorvastatin 80 mg daily  FEN/GI: heart healthy/carb modified/renal diet PPx: Sub-q heparin for ppx  Disposition: Home pending clinical progression and more permanent vascular access for ongoing HD.  Subjective:  Patient states that his stomach still feels somewhat queasy but that he is improving.  He denies abdominal pain or other complaints at this time.  Patient expressed that he wished for me to call his outpatient dialysis center and informed them that he would not be able to make it today as he is still in the hospital.  I informed patient I would call his outpatient dialysis and let them know.  Patient expecting to have dialysis inpatient today.  Objective: Temp:  [98.1 F (36.7 C)-98.4 F (36.9 C)] 98.1 F (36.7 C) (10/07 1200) Pulse Rate:  [65-80] 72 (10/07 1400)  Resp:  [16-18] 16 (10/07 1210) BP: (115-156)/(43-71) 120/63 (10/07 1400) SpO2:  [91 %-98 %] 95 % (10/07 1200) Weight:  [85.6 kg-86.9 kg] 86.9 kg (10/07 1200)    Physical Exam:  General: Alert and oriented in no apparent distress Heart: Regular rate and rhythm with no murmurs appreciated Lungs: CTA bilaterally, no wheezing, breathing well on room air. Abdomen: Bowel sounds present, no abdominal pain Skin: Warm and dry   Laboratory: Recent Labs  Lab 07/29/19 0357 07/30/19 1219 07/31/19 0318  WBC 6.7 7.0 7.0  HGB 8.2* 9.7* 8.8*  HCT 24.4* 29.3* 25.8*  PLT 205 280 284   Recent Labs  Lab 07/29/19 0357 07/30/19 1219 07/31/19 0318  NA 129* 132* 131*  K 3.6 3.9 4.1  CL 93* 96* 96*  CO2 18* 19* 18*  BUN 117* 99* 117*  CREATININE 8.94* 7.39* 7.77*  CALCIUM 8.0* 8.4* 8.4*  GLUCOSE 246* 155* 202*    Imaging/Diagnostic Tests: No results found.  Lurline Del, DO 07/31/2019, 2:37 PM PGY-1, [facility] Intern pager: [phone], text pages welcome

## 2019-08-01 ENCOUNTER — Inpatient Hospital Stay (HOSPITAL_COMMUNITY): Payer: Medicare Other | Admitting: Certified Registered"

## 2019-08-01 ENCOUNTER — Encounter (HOSPITAL_COMMUNITY)
Admission: EM | Disposition: A | Discharge status: Home / Self Care | Payer: Self-pay | Source: Home / Self Care | Attending: Family Medicine

## 2019-08-01 ENCOUNTER — Inpatient Hospital Stay (HOSPITAL_COMMUNITY): Payer: Medicare Other

## 2019-08-01 DIAGNOSIS — M79642 Pain in left hand: Secondary | ICD-10-CM

## 2019-08-01 DIAGNOSIS — Z992 Dependence on renal dialysis: Secondary | ICD-10-CM

## 2019-08-01 DIAGNOSIS — N185 Chronic kidney disease, stage 5: Secondary | ICD-10-CM

## 2019-08-01 DIAGNOSIS — N186 End stage renal disease: Secondary | ICD-10-CM

## 2019-08-01 HISTORY — PX: AV FISTULA PLACEMENT: SHX1204

## 2019-08-01 LAB — RENAL FUNCTION PANEL
Albumin: 2.2 g/dL — ABNORMAL LOW (ref 3.5–5.0)
Anion gap: 17 — ABNORMAL HIGH (ref 5–15)
BUN: 65 mg/dL — ABNORMAL HIGH (ref 8–23)
CO2: 20 mmol/L — ABNORMAL LOW (ref 22–32)
Calcium: 8.5 mg/dL — ABNORMAL LOW (ref 8.9–10.3)
Chloride: 95 mmol/L — ABNORMAL LOW (ref 98–111)
Creatinine, Ser: 5.73 mg/dL — ABNORMAL HIGH (ref 0.61–1.24)
GFR calc Af Amer: 11 mL/min — ABNORMAL LOW (ref >60)
GFR calc non Af Amer: 9 mL/min — ABNORMAL LOW (ref >60)
Glucose, Bld: 205 mg/dL — ABNORMAL HIGH (ref 70–99)
Phosphorus: 6.5 mg/dL — ABNORMAL HIGH (ref 2.5–4.6)
Potassium: 3.8 mmol/L (ref 3.5–5.1)
Sodium: 132 mmol/L — ABNORMAL LOW (ref 135–145)

## 2019-08-01 LAB — GLUCOSE, CAPILLARY
Glucose-Capillary: 170 mg/dL — ABNORMAL HIGH (ref 70–99)
Glucose-Capillary: 147 mg/dL — ABNORMAL HIGH (ref 70–99)
Glucose-Capillary: 161 mg/dL — ABNORMAL HIGH (ref 70–99)
Glucose-Capillary: 187 mg/dL — ABNORMAL HIGH (ref 70–99)

## 2019-08-01 LAB — CBC
HCT: 27.3 % — ABNORMAL LOW (ref 39.0–52.0)
Hemoglobin: 9.4 g/dL — ABNORMAL LOW (ref 13.0–17.0)
MCH: 31.8 pg (ref 26.0–34.0)
MCHC: 34.4 g/dL (ref 30.0–36.0)
MCV: 92.2 fL (ref 80.0–100.0)
Platelets: 298 10*3/uL (ref 150–400)
RBC: 2.96 MIL/uL — ABNORMAL LOW (ref 4.22–5.81)
RDW: 14.1 % (ref 11.5–15.5)
WBC: 7.8 10*3/uL (ref 4.0–10.5)
nRBC: 0.3 % — ABNORMAL HIGH (ref 0.0–0.2)

## 2019-08-01 LAB — SURGICAL PCR SCREEN
MRSA, PCR: NEGATIVE
Staphylococcus aureus: NEGATIVE

## 2019-08-01 SURGERY — ARTERIOVENOUS (AV) FISTULA CREATION
Anesthesia: Monitor Anesthesia Care | Site: Arm Upper | Laterality: Left

## 2019-08-01 MED ORDER — ASPIRIN 81 MG PO TBEC: 81.0000 mg | DELAYED_RELEASE_TABLET | Freq: Every day | ORAL | 0 refills | Status: AC

## 2019-08-01 MED ORDER — FENTANYL CITRATE (PF) 250 MCG/5ML IJ SOLN
INTRAMUSCULAR | Status: DC | PRN
  Administered 2019-08-01: 100 ug via INTRAVENOUS

## 2019-08-01 MED ORDER — 0.9 % SODIUM CHLORIDE (POUR BTL) OPTIME
TOPICAL | Status: DC | PRN
  Administered 2019-08-01: 1000 mL

## 2019-08-01 MED ORDER — LIDOCAINE-EPINEPHRINE 0.5 %-1:200000 IJ SOLN
INTRAMUSCULAR | Status: DC | PRN
  Administered 2019-08-01: 7 mL

## 2019-08-01 MED ORDER — MIDAZOLAM HCL 2 MG/2ML IJ SOLN
INTRAMUSCULAR | Status: AC
  Filled 2019-08-01: qty 2

## 2019-08-01 MED ORDER — SODIUM CHLORIDE 0.9 % IV SOLN
INTRAVENOUS | Status: DC | PRN
  Administered 2019-08-01: 11:00:00 50 ug/min via INTRAVENOUS

## 2019-08-01 MED ORDER — HYDROCODONE-ACETAMINOPHEN 5-325 MG PO TABS: 1.0000 | ORAL_TABLET | ORAL | Status: DC | PRN

## 2019-08-01 MED ORDER — TOUJEO MAX SOLOSTAR 300 UNIT/ML ~~LOC~~ SOPN: 5.0000 [IU] | PEN_INJECTOR | Freq: Every day | SUBCUTANEOUS | 3 refills | Status: DC

## 2019-08-01 MED ORDER — PROPOFOL 500 MG/50ML IV EMUL
INTRAVENOUS | Status: DC | PRN
  Administered 2019-08-01: 50 ug/kg/min via INTRAVENOUS

## 2019-08-01 MED ORDER — MIDAZOLAM HCL 5 MG/5ML IJ SOLN
INTRAMUSCULAR | Status: DC | PRN
  Administered 2019-08-01: 2 mg via INTRAVENOUS

## 2019-08-01 MED ORDER — LIDOCAINE 2% (20 MG/ML) 5 ML SYRINGE
INTRAMUSCULAR | Status: DC | PRN
  Administered 2019-08-01: 100 mg via INTRAVENOUS

## 2019-08-01 MED ORDER — SODIUM CHLORIDE 0.9 % IV SOLN
INTRAVENOUS | Status: DC | PRN
  Administered 2019-08-01: 500 mL

## 2019-08-01 MED ORDER — SODIUM CHLORIDE 0.9 % IV SOLN
INTRAVENOUS | Status: AC
  Filled 2019-08-01: qty 1.2

## 2019-08-01 MED ORDER — PHENYLEPHRINE 40 MCG/ML (10ML) SYRINGE FOR IV PUSH (FOR BLOOD PRESSURE SUPPORT)
PREFILLED_SYRINGE | INTRAVENOUS | Status: DC | PRN
  Administered 2019-08-01: 80 ug via INTRAVENOUS
  Administered 2019-08-01: 40 ug via INTRAVENOUS
  Administered 2019-08-01: 120 ug via INTRAVENOUS
  Administered 2019-08-01 (×2): 80 ug via INTRAVENOUS

## 2019-08-01 MED ORDER — SIMETHICONE 80 MG PO CHEW: 80.0000 mg | CHEWABLE_TABLET | Freq: Four times a day (QID) | ORAL | 0 refills | Status: DC | PRN

## 2019-08-01 MED ORDER — VASOPRESSIN 20 UNIT/ML IV SOLN
INTRAVENOUS | Status: AC
  Filled 2019-08-01: qty 1

## 2019-08-01 MED ORDER — TOUJEO MAX SOLOSTAR 300 UNIT/ML ~~LOC~~ SOPN: 6.0000 [IU] | PEN_INJECTOR | Freq: Every day | SUBCUTANEOUS | 0 refills | Status: DC

## 2019-08-01 MED ORDER — GABAPENTIN 300 MG PO CAPS: 300.0000 mg | ORAL_CAPSULE | Freq: Every day | ORAL | 1 refills | Status: DC

## 2019-08-01 MED ORDER — VASOPRESSIN 20 UNIT/ML IV SOLN
INTRAVENOUS | Status: DC | PRN
  Administered 2019-08-01 (×2): 2 [IU] via INTRAVENOUS

## 2019-08-01 MED ORDER — LIDOCAINE-EPINEPHRINE 0.5 %-1:200000 IJ SOLN
INTRAMUSCULAR | Status: AC
  Filled 2019-08-01: qty 1

## 2019-08-01 MED ORDER — SODIUM CHLORIDE 0.9 % IV SOLN
INTRAVENOUS | Status: DC
  Administered 2019-08-01: 10:00:00 via INTRAVENOUS

## 2019-08-01 MED ORDER — SEVELAMER CARBONATE 800 MG PO TABS: 800.0000 mg | ORAL_TABLET | Freq: Three times a day (TID) | ORAL | 0 refills | Status: AC

## 2019-08-01 MED ORDER — FENTANYL CITRATE (PF) 250 MCG/5ML IJ SOLN
INTRAMUSCULAR | Status: AC
  Filled 2019-08-01: qty 5

## 2019-08-01 MED FILL — LANTUS SOLOSTAR 100 UNITS/M: 100 | 30 days supply | Qty: 3 | Fill #0

## 2019-08-01 MED FILL — SEVELAMER CARBONATE 800MG: 800 | 30 days supply | Qty: 90 | Fill #0

## 2019-08-01 MED FILL — ASPIRIN 81MG ADULT LOW STRE: 81 | 30 days supply | Qty: 30 | Fill #0

## 2019-08-01 MED FILL — SIMETHICONE 80 MG CHEW: 80 | 30 days supply | Qty: 30 | Fill #0

## 2019-08-01 MED FILL — PENTIPS 31G X 8 MM MISC: 31G X 8 MM | 30 days supply | Qty: 100 | Fill #0

## 2019-08-01 SURGICAL SUPPLY — 38 items
ARMBAND PINK RESTRICT EXTREMIT (MISCELLANEOUS) ×3 IMPLANT
CANISTER SUCT 3000ML PPV (MISCELLANEOUS) ×3 IMPLANT
CANNULA VESSEL W/WING WO/VALVE (CANNULA) ×2 IMPLANT
CLIP LIGATING EXTRA MED SLVR (CLIP) ×2 IMPLANT
CLIP LIGATING EXTRA SM BLUE (MISCELLANEOUS) ×2 IMPLANT
CLIP VESOCCLUDE MED 6/CT (CLIP) ×1 IMPLANT
CLIP VESOCCLUDE SM WIDE 6/CT (CLIP) ×1 IMPLANT
COVER PROBE W GEL 5X96 (DRAPES) ×2 IMPLANT
COVER WAND RF STERILE (DRAPES) ×1 IMPLANT
DERMABOND ADVANCED (GAUZE/BANDAGES/DRESSINGS) ×2
DERMABOND ADVANCED .7 DNX12 (GAUZE/BANDAGES/DRESSINGS) ×1 IMPLANT
ELECT REM PT RETURN 9FT ADLT (ELECTROSURGICAL) ×3
ELECTRODE REM PT RTRN 9FT ADLT (ELECTROSURGICAL) ×1 IMPLANT
GLOVE BIO SURGEON STRL SZ 6.5 (GLOVE) ×1 IMPLANT
GLOVE BIO SURGEON STRL SZ7.5 (GLOVE) ×5 IMPLANT
GLOVE BIO SURGEONS STRL SZ 6.5 (GLOVE) ×1
GLOVE BIOGEL PI IND STRL 6.5 (GLOVE) IMPLANT
GLOVE BIOGEL PI INDICATOR 6.5 (GLOVE) ×2
GLOVE SS BIOGEL STRL SZ 7.5 (GLOVE) IMPLANT
GLOVE SUPERSENSE BIOGEL SZ 7.5 (GLOVE) ×2
GOWN STRL REUS W/ TWL LRG LVL3 (GOWN DISPOSABLE) ×2 IMPLANT
GOWN STRL REUS W/ TWL XL LVL3 (GOWN DISPOSABLE) ×1 IMPLANT
GOWN STRL REUS W/TWL LRG LVL3 (GOWN DISPOSABLE) ×4
GOWN STRL REUS W/TWL XL LVL3 (GOWN DISPOSABLE) ×2
INSERT FOGARTY SM (MISCELLANEOUS) IMPLANT
KIT BASIN OR (CUSTOM PROCEDURE TRAY) ×3 IMPLANT
KIT TURNOVER KIT B (KITS) ×3 IMPLANT
NS IRRIG 1000ML POUR BTL (IV SOLUTION) ×3 IMPLANT
PACK CV ACCESS (CUSTOM PROCEDURE TRAY) ×3 IMPLANT
PAD ARMBOARD 7.5X6 YLW CONV (MISCELLANEOUS) ×6 IMPLANT
SUT MNCRL AB 4-0 PS2 18 (SUTURE) ×3 IMPLANT
SUT PROLENE 6 0 BV (SUTURE) ×3 IMPLANT
SUT SILK 2 0 SH (SUTURE) ×2 IMPLANT
SUT VIC AB 3-0 SH 27 (SUTURE) ×2
SUT VIC AB 3-0 SH 27X BRD (SUTURE) ×1 IMPLANT
TOWEL GREEN STERILE (TOWEL DISPOSABLE) ×3 IMPLANT
UNDERPAD 30X30 (UNDERPADS AND DIAPERS) ×3 IMPLANT
WATER STERILE IRR 1000ML POUR (IV SOLUTION) ×3 IMPLANT

## 2019-08-01 NOTE — Progress Notes (Signed)
Family Medicine Teaching Service Daily Progress Note Intern Pager: [phone]  Patient name: Jacob [name] Medical record number: [id number] Date of birth: 1951-02-18 Age: 68 y.o. Gender: male  Primary Care Provider: [name], NP Consultants: Cardiology, Nephrology Code Status: Full  Pt Overview and Major Events to Date:  9/27- admitted, NSTEMI 9/29-unable to cannulate AV fistula 9/30-plan to go to IR for temporary catheter for HD  Antibiotics Ceftriaxone (9/29-9/30) Doxycycline 9/29-9/30) Vancomycin (10/1-10/2) Cefepime (10/1-10/3) Cefdinir (10/3-10/9) Assessment and Plan: Jacob [name] is a 68 y.o. male presenting with dyspnea, chest pain. PMH is significant for CHF, CAD, cardiomyopathy, HIV, DMT2, substance use disorder.  Community-acquired pneumonia Afebrile with last fever noted on 10/1.  Patient weaned to room air 10/4.  Appears to be breathing well on room air. -Antibiotics noted above -S/p vancomycin, cefepime -Cefdinir for a total of a 7-dose course with day 1 being the first day of vancomycin (10/1-10/9), cefdinir dosed every other day due to renal function with last day being 10/9.  Left hand pain Started yesterday.  No signs of erythema or swelling on exam.  Some tenderness to palpation of the third, fourth, fifth proximal metacarpals.  Soft tissue ultrasound showed "a 6 mm complex cystic area which appears to contain blood flow in the area of the patient's pain on the dorsum of the hand. I doubt that this is clinically significant. No ganglion cysts or solid mass lesions."  New ESRD now on HD Now set up with dialysis on Monday, Wednesday, Friday.  Patient received a tunneled HD catheter yesterday via IR.  Next dialysis planned for this morning.  Patient is scheduled for left radiocephalic and brachiocephalic AV fistula procedure on Thursday, 10/8.  This is an outpatient procedure but will be performed at the hospital. -Nephrology following, appreciate  recommendations -Calcitriol  NSTEMI  H/o CAD s/p CABG 2004 No chest pain currently.  Cardiology not planning for any intervention for this hospitalization.  Cards appointment scheduled for 10/28.  -ASA 81 daily -Atorvastatin 80 mg daily -Carvedilol 25 mg twice daily -Isosorbide mononitrate 30 mg daily -Heparin SQ  Anemia of Chronic Disease  H/O GI Bleed (01/2019)-stable Hemoglobin 10/8 9.4.  FOBT positive.  Plan to follow-up with GI outpatient does not appear to be an acute bleed. - Patient received 1 unit RBCs on 10/1 - Transfusion threshold 8 - Darbepoetin every Friday  Polysubstance Use Disorder Intermittent cocaine use.  UDS positive for opioids.  Smoking history. - Nicotine patch PRN - encourage cessation of drug use  Elevated LFTs-resolved No further work-up at this time.  T2DM: CBGs 130s-230s/24 hours.  - continue sensitive SSI -Monitor CBGs  COPD Has 45-pack-year smoking history.  He smokes a pack per day.   Jearl Klinefelter Ellipta  HTN Patient with soft blood pressures overnight, most recent 96/52.  Trend over past 24 hours 82-156/43-81. -Hold BP meds with current soft blood pressures.  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 HIV-1 RNA quantification was not detectable on 06/24/2019. He follows with Dr. [name] at [facility]. Last appt 07/08/2019. Good control. -Biktarvy daily  Gout -allopurinol  HLD -Atorvastatin 80 mg daily  FEN/GI: heart healthy/carb modified/renal diet PPx: Sub-q heparin for ppx  Disposition: Pending AV fistula surgery later today.  Subjective:  Patient states that today's only complaint is at left hand pain.  He states this started yesterday and is very tender along the backside of his hand.  He does not recall injuring it and does not have a good reason for it to be hurting.  He denies stomach pains this morning states his diarrhea has mostly resolved.  Objective: Temp:  [97.3 F (36.3 C)-98.8 F (37.1 C)] 98.2 F (36.8 C) (10/08 0531) Pulse  Rate:  [65-75] 70 (10/08 0531) Resp:  [16-18] 18 (10/08 0531) BP: (73-156)/(34-81) 96/52 (10/08 0531) SpO2:  [92 %-100 %] 95 % (10/08 0531) Weight:  [84.7 kg-86.9 kg] 86.1 kg (10/08 0529)   Physical Exam:  General: Alert and oriented in no apparent distress Heart: Regular rate and rhythm with no murmurs appreciated Lungs: CTA bilaterally, no wheezing Abdomen: Bowel sounds present, no abdominal pain on palpation Skin: Warm and dry Extremities: Left hand with no erythema/swelling and tenderness to palpation of the third, fourth, fifth proximal metacarpal heads.    Laboratory: Recent Labs  Lab 07/30/19 1219 07/31/19 0318 08/01/19 0325  WBC 7.0 7.0 7.8  HGB 9.7* 8.8* 9.4*  HCT 29.3* 25.8* 27.3*  PLT 280 284 298   Recent Labs  Lab 07/30/19 1219 07/31/19 0318 08/01/19 0325  NA 132* 131* 132*  K 3.9 4.1 3.8  CL 96* 96* 95*  CO2 19* 18* 20*  BUN 99* 117* 65*  CREATININE 7.39* 7.77* 5.73*  CALCIUM 8.4* 8.4* 8.5*  GLUCOSE 155* 202* 205*    Imaging/Diagnostic Tests: No results found.  Lurline Del, DO 08/01/2019, 6:31 AM PGY-1, [place] Intern pager: [phone], text pages welcome

## 2019-08-01 NOTE — Interval H&P Note (Signed)
History and Physical Interval Note:  08/01/2019 10:44 AM  Jacob [name]  has presented today for surgery, with the diagnosis of END STAGE RENAL DISEASE.  The various methods of treatment have been discussed with the patient and family. After consideration of risks, benefits and other options for treatment, the patient has consented to  Procedure(s): LEFT BRACHIOCEPHALIC ARTERIOVENOUS (AV) FISTULA CREATION (Left) as a surgical intervention.  The patient's history has been reviewed, patient examined, no change in status, stable for surgery.  I have reviewed the patient's chart and labs.  Questions were answered to the patient's satisfaction.     Curt Jews

## 2019-08-01 NOTE — Anesthesia Postprocedure Evaluation (Signed)
Anesthesia Post Note  Patient: Jacob [name]  Procedure(s) Performed: LEFT BRACHIOCEPHALIC ARTERIOVENOUS (AV) FISTULA CREATION (Left Arm Upper)     Patient location during evaluation: PACU Anesthesia Type: MAC Level of consciousness: awake and alert Pain management: pain level controlled Vital Signs Assessment: post-procedure vital signs reviewed and stable Respiratory status: spontaneous breathing, nonlabored ventilation and respiratory function stable Cardiovascular status: stable and blood pressure returned to baseline Anesthetic complications: no    Last Vitals:  Vitals:   08/01/19 1159 08/01/19 1300  BP: 106/68 (!) 115/53  Pulse:  67  Resp:    Temp:    SpO2:      Last Pain:  Vitals:   08/01/19 1145  TempSrc:   PainSc: 0-No pain                 Audry Pili

## 2019-08-01 NOTE — Transfer of Care (Signed)
Immediate Anesthesia Transfer of Care Note  Patient: Jacob [name]  Procedure(s) Performed: LEFT BRACHIOCEPHALIC ARTERIOVENOUS (AV) FISTULA CREATION (Left Arm Upper)  Patient Location: PACU  Anesthesia Type:MAC  Level of Consciousness: awake, alert , oriented and patient cooperative  Airway & Oxygen Therapy: Patient Spontanous Breathing and Patient connected to nasal cannula oxygen  Post-op Assessment: Report given to RN, Post -op Vital signs reviewed and stable and Patient moving all extremities  Post vital signs: Reviewed and stable  Last Vitals:  Vitals Value Taken Time  BP 132/59 08/01/19 1145  Temp 36 C 08/01/19 1145  Pulse 32 08/01/19 1148  Resp 14 08/01/19 1149  SpO2 62 % 08/01/19 1148  Vitals shown include unvalidated device data.  Last Pain:  Vitals:   08/01/19 0832  TempSrc: Oral  PainSc:       Patients Stated Pain Goal: 0 (18/84/16 6063)  Complications: No apparent anesthesia complications

## 2019-08-01 NOTE — Op Note (Signed)
    OPERATIVE REPORT  DATE OF SURGERY: 08/01/2019  PATIENT: Jacob [name], 68 y.o. male MRN: [id number]  DOB: 16-Jun-1951  PRE-OPERATIVE DIAGNOSIS: End-stage renal disease  POST-OPERATIVE DIAGNOSIS:  Same  PROCEDURE: For stage left brachiobasilic fistula  SURGEON:  Curt Jews, M.D.  PHYSICIAN ASSISTANT: Matt Eveland, PA-C  ANESTHESIA: Local with sedation  EBL: per anesthesia record  Total I/O In: -  Out: 20 [Blood:20]  BLOOD ADMINISTERED: none  DRAINS: none  SPECIMEN: none  COUNTS CORRECT:  YES  PATIENT DISPOSITION:  PACU - hemodynamically stable  PROCEDURE DETAILS: The patient was taken operating placed supine surgeon where the area of the left arm was prepped and draped in sterile fashion.  SonoSite ultrasound revealed very small cephalic vein but very large basilic vein.  Using local anesthesia incision made over the antecubital space and carried down to isolate the basilic vein.  Tributary branches were ligated with 3-0 silk ties and the vein was divided distally after ligation.  The vein was brought into approximation to the brachial artery which was of large caliber with minimal atherosclerotic change.  The artery was occluded proximally distally and was opened 11 blade sent alternating with Potts scissors.  The vein was cut to appropriate length and was sewn end-to-side to the artery with a running 6-0 Prolene suture.  Clamps removed and excellent thrill was noted.  The wounds irrigated with saline.  Hemostasis obtained after cautery.  Wounds were closed with 3-0 Vicryl in the subcutaneous and subcuticular tissue.  Sterile dressing was applied and the patient was transferred to the recovery room in stable condition   Jacob [name], M.D., [facility] 08/01/2019 11:54 AM

## 2019-08-01 NOTE — Progress Notes (Signed)
[name], male   DOB: 1950/11/06, 68 y.o.   MRN: [id number] [facility] Progress Note   Assessment/ Plan:   1.  Acute exacerbation of diastolic heart failure:  Secondary to recent cocaine use and indiscretions with sodium/fluid intake.  Continue hemodialysis for volume management with outpatient follow-up with cardiology as planned for possible coronary angiography when volume status optimized.  2.    End-stage renal disease from progressive chronic kidney disease stage V:  Started on hemodialysis this hospitalization for mild uremic symptoms with diuresis refractory volume overload.  Unsuccessful using his left RCF and had right IJ temporary catheter for dialysis placed by IR.  Tunneled catheter then placed per IR on 10/6 - HD per MWF schedule  - Consulted vascular surgery for permanent access.  s/p conversion of left radiocephalic to brachiocephalic AV fistula on 95/6 - He has been accepted to [facility] on a Monday/Wednesday/Friday dialysis schedule - see Terri Piedra note for where and when he needs to be there tomorrow  3.  Anemia of chronic kidney disease: Denies overt loss and note recent records significant for GI bleeding with AVMs.  Continue IV iron.  aranesp re-ordered   4.  Secondary hyperparathyroidism:  hyperphos - hopeful for improved clearance with tunneled catheter.  Start renvela for now.  continue calcitriol    5.  HIV infection: antiretroviral therapy per primary team   6.  Polysubstance abuse: Encouraged efforts at cessation of cocaine use.  7.  Fever: Currently afebrile.  Suspected to be a respiratory origin with pneumonia-now on oral Omnicef from vancomycin/cefepime.   Disposition - from our standpoint can be discharged today   Subjective:   Spoke with patient.  He is ready for discharge and knows where he is supposed to be tomorrow and when for OP dialysis - orders sent   Objective:   BP (!) 115/53   Pulse 67   Temp (!) 96.8 F (36 C)    Resp 18   Ht 6\' 2"  (1.88 m)   Wt 86.1 kg   SpO2 100%   BMI 24.37 kg/m   Intake/Output Summary (Last 24 hours) at 08/01/2019 1604 Last data filed at 08/01/2019 1152 Gross per 24 hour  Intake -  Output 220 ml  Net -220 ml   Weight change:   Physical Exam:  Gen: adult male in bed in NAD at rest CVS: Pulse regular rhythm and normal rate, S1 and S2 normal Resp: Clear to auscultation bilaterally without distinct rales or rhonchi Abd: Soft, obese, nontender, bowel sounds normal Ext: no lower extremity edema Access left AVF with palpable thrill.  Right chest tunneled dialysis catheter  Imaging: [place] Lt Upper Extrem Ltd Soft Tissue Non Vascular  Result Date: 08/01/2019 CLINICAL DATA:  Left hand pain. EXAM: ULTRASOUND LEFT UPPER EXTREMITY LIMITED TECHNIQUE: Ultrasound examination of the upper extremity soft tissues was performed in the area of clinical concern. COMPARISON:  None. FINDINGS: There is a 6 mm complex cystic area which appears to contain blood flow in the area of the patient's pain on the dorsum of the hand. I doubt that this is clinically significant. No ganglion cysts or solid mass lesions. IMPRESSION: No definitive abnormality in the area of pain. Electronically Signed   By: Lorriane Shire M.D.   On: 08/01/2019 07:52    Labs: BMET Recent Labs  Lab 07/26/19 0610 07/27/19 0322 07/28/19 1326 07/29/19 0357 07/30/19 1219 07/31/19 0318 08/01/19 0325  NA 135 127* 129* 129* 132* 131* 132*  K  4.1 3.6 3.2* 3.6 3.9 4.1 3.8  CL 98 90* 92* 93* 96* 96* 95*  CO2 23 23 20* 18* 19* 18* 20*  GLUCOSE 152* 228* 217* 246* 155* 202* 205*  BUN 78* 57* 100* 117* 99* 117* 65*  CREATININE 6.45* 5.29* 8.15* 8.94* 7.39* 7.77* 5.73*  CALCIUM 8.4* 7.9* 8.1* 8.0* 8.4* 8.4* 8.5*  PHOS  --   --   --   --  7.5* 8.2* 6.5*   CBC Recent Labs  Lab 07/29/19 0357 07/30/19 1219 07/31/19 0318 08/01/19 0325  WBC 6.7 7.0 7.0 7.8  NEUTROABS 4.4  --   --   --   HGB 8.2* 9.7* 8.8* 9.4*  HCT 24.4*  29.3* 25.8* 27.3*  MCV 96.1 96.1 93.8 92.2  PLT 205 280 284 298   Medications:    . allopurinol  100 mg Oral Daily  . aspirin EC  81 mg Oral Daily  . atorvastatin  80 mg Oral Daily  . bictegravir-emtricitabine-tenofovir AF  1 tablet Oral QPM  . calcitRIOL  0.25 mcg Oral Daily  . carvedilol  25 mg Oral BID WC  . Chlorhexidine Gluconate Cloth  6 each Topical Q0600  . darbepoetin (ARANESP) injection - DIALYSIS  60 mcg Intravenous Q Wed-HD  . diclofenac sodium  2 g Topical QID  . gabapentin  300 mg Oral QHS  . heparin injection (subcutaneous)  5,000 Units Subcutaneous Q8H  . hydrALAZINE  25 mg Oral TID  . insulin aspart  0-9 Units Subcutaneous TID WC  . isosorbide mononitrate  30 mg Oral Daily  . pantoprazole  40 mg Oral Daily  . sevelamer carbonate  800 mg Oral TID WC  . umeclidinium-vilanterol  1 puff Inhalation Daily   Jaelyn Cloninger A Mckinzee Spirito 08/01/2019, 4:04 PM

## 2019-08-01 NOTE — Anesthesia Preprocedure Evaluation (Addendum)
Anesthesia Evaluation  Patient identified by MRN, date of birth, ID band Patient awake    Reviewed: Allergy & Precautions, NPO status , Patient's Chart, lab work & pertinent test results, reviewed documented beta blocker date and time   History of Anesthesia Complications Negative for: history of anesthetic complications  Airway Mallampati: II  TM Distance: >3 FB Neck ROM: Full    Dental  (+) Dental Advisory Given, Partial Upper, Edentulous Lower   Pulmonary COPD,  COPD inhaler, Current Smoker and Patient abstained from smoking.,    Pulmonary exam normal        Cardiovascular hypertension, Pt. on medications and Pt. on home beta blockers + CAD, + Past MI, + CABG and +CHF  Normal cardiovascular exam   '20 TTE (2 months after myoperfusion) - EF 40-45%. Mild basal septal hypertrophy. Left ventricular diastolic Doppler parameters are consistent with pseudonormalization. Left ventricular diffuse hypokinesis. Left atrial size was severely dilated. There was trivial MR, TR and PR.  '20 Myoperfusion - Nuclear stress EF: 26%. BP demonstrated a normal response to exercise. There was no ST segment deviation noted during stress. Findings consistent with prior myocardial infarction. This is a high risk study. The LVEF is severely decreased (<30%). Recommended cath after dialysis begun      Neuro/Psych  Neuromuscular disease (peripheral neuropathy) negative psych ROS   GI/Hepatic GERD  Medicated and Controlled,(+)     substance abuse  alcohol use and cocaine use, Hepatitis -, B  Endo/Other  diabetes, Type 2, Insulin Dependent Hyponatremia Hypocalcemia Hypophosphatemia   Renal/GU ESRFRenal disease     Musculoskeletal  (+) Arthritis ,  Gout Chronic back pain    Abdominal   Peds  Hematology  (+) anemia , HIV,   Anesthesia Other Findings Syphilis   Reproductive/Obstetrics                             Anesthesia Physical Anesthesia Plan  ASA: III  Anesthesia Plan: MAC   Post-op Pain Management:    Induction: Intravenous  PONV Risk Score and Plan: 1 and Propofol infusion and Treatment may vary due to age or medical condition  Airway Management Planned: Natural Airway and Simple Face Mask  Additional Equipment: None  Intra-op Plan:   Post-operative Plan:   Informed Consent: I have reviewed the patients History and Physical, chart, labs and discussed the procedure including the risks, benefits and alternatives for the proposed anesthesia with the patient or authorized representative who has indicated his/her understanding and acceptance.       Plan Discussed with: CRNA and Anesthesiologist  Anesthesia Plan Comments:      Anesthesia Quick Evaluation

## 2019-08-01 NOTE — Discharge Instructions (Signed)
While you are in the hospital, we treated use for a small variety of problems.  He is a quick outline below:  Pneumonia We treated you with antibiotics for pneumonia.  We are discharging you with 1 final dose of medication for pneumonia.  Please remember to take your last dose of cefdinir on 10/9.  Heart attack While you are here you were diagnosed with a heart attack called an NSTEMI.  We were not able to complete the work-up for this while you are inpatient because of your kidney problems.  Please follow-up with cardiology in the outpatient setting to continue working up your heart problems.  Kidney failure While in the hospital, your kidneys were severely affected to the point where it was necessary to begin dialysis.  As you are aware, you need to continue receiving dialysis every Monday, Wednesday, Friday.  Please continue to work with your kidney doctor in the outpatient setting.  The last procedure he had done in the hospital was a revision of her arteriovenous fistula.  After several months, this will mature and will be able to use this fistula for dialysis instead of your temporary catheter.  Diabetes We have significantly decreased the insulin that you need to take at home.  You should now only be taking 5 units of long-acting (glargine) insulin at home.  Do not take more than 6 units a day.  Could be very dangerous for you to take more insulin than this.  Please follow-up with your primary care physician in 1 week to talk about this hospitalization and new diabetes medication.  Your insulin requirements will be changing because he recently started dialysis.

## 2019-08-01 NOTE — Evaluation (Signed)
Speech Language Pathology Evaluation Patient Details Name: Jacob [name] MRN: [id number] DOB: [birth year] Today's Date: 08/01/2019 Time: 0263-7858 SLP Time Calculation (min) (ACUTE ONLY): 17 min  Problem List:  Patient Active Problem List   Diagnosis Date Noted  . Cocaine abuse ([place])   . Hypertensive emergency   . CHF exacerbation ([place]) 07/21/2019  . Diabetic foot ulcer ([place]) 02/11/2019  . Gastric AVM   . CKD stage 4 due to type 2 diabetes mellitus ([place]) 05/30/2018  . Hyperlipidemia associated with type 2 diabetes mellitus ([place]) 05/30/2018  . Right foot ulcer ([place]) 02/28/2018  . Chronic obstructive pulmonary disease ([place]) 02/28/2018  . Anemia of chronic disease 11/20/2016  . CKD (chronic kidney disease) stage 3, GFR 30-59 ml/min 11/20/2016  . CHF (congestive heart failure) ([place]) 11/10/2016  . Pneumonia 08/02/2016  . History of lumbar laminectomy for spinal cord decompression 02/29/2016  . Type 2 diabetes mellitus with vascular disease ([place]) 06/17/2015  . HTN (hypertension) 04/26/2015  . DM (diabetes mellitus) ([place]) 04/26/2015  . Ischemic cardiomyopathy 05/12/2014  . Ulcer of lower extremity ([place]) 09/06/2013  . Chest pain 09/06/2013  . Type II or unspecified type diabetes mellitus with unspecified complication, not stated as uncontrolled 10/22/2012  . Bunion of left foot 11/25/2011  . Bunion, right foot 11/25/2011  . Polysubstance abuse ([place]) 07/28/2011  . Hip fracture, left ([place]) 04/04/2011  . Closed fracture of neck of femur ([place]) 04/04/2011  . Insomnia 02/18/2011  . CAD (coronary artery disease) 04/20/2009  . Acute on chronic combined systolic (congestive) and diastolic (congestive) heart failure ([place]) 04/20/2009  . HLD (hyperlipidemia) 11/20/2006  . Gout 11/20/2006  . TOBACCO ABUSE 11/20/2006  . Essential hypertension 11/20/2006  . Human immunodeficiency virus (HIV) disease ([place]) 09/02/2006   Past Medical History:  Past Medical History:  Diagnosis Date  . Acute  respiratory failure ([place]) 03/01/2018  . Arthritis    "all over; mostly knees and back" (02/28/2018)  . CHF (congestive heart failure) ([facility])    not on any meds  . Chronic lower back pain    stenosis  . CKD (chronic kidney disease), stage V ([place])    [name] 02/28/2018  . Community acquired pneumonia 09/06/2013  . Coronary atherosclerosis of native coronary artery 2005   s/p surgery  . Drug abuse ([place])    hx; tested for cocaine as recently as 2/08. says he is not using drugs now - avoided defib. for this reason   . GERD (gastroesophageal reflux disease)    takes OTC meds as needed  . GI bleeding 02/06/2019  . Glaucoma    uses eye drops daily  . Hepatitis B 1968   "tx'd w/isolation; caught it from toilet stools in gym"  . History of blood transfusion 03/01/2019  . History of colon polyps    benign  . History of gout    takes Allopurinol daily as well as Colchicine-if needed (02/28/2018)  . History of kidney stones   . HTN (hypertension)    takes Coreg,Imdur.and Apresoline daily  . Human immunodeficiency virus (HIV) disease ([place]) dx'd 1995   takes Genvoya daily  . Hyperlipidemia    takes Atorvastatin daily  . Ischemic cardiomyopathy   . Muscle spasm    takes Zanaflex as needed  . Myocardial infarction ([place]) ~ 2004/2005  . Nocturia   . Peripheral neuropathy    takes gabapentin daily  . Pneumonia    "at least twice" (02/28/2018)  . Shortness of breath dyspnea    rarely but if notices it  then with exertion  . Syphilis, unspecified   . Type II diabetes mellitus ([place]) 2004   Lantus daily.Average fasting blood sugar 125-199  . Wears glasses   . Wears partial dentures    Past Surgical History:  Past Surgical History:  Procedure Laterality Date  . AV FISTULA PLACEMENT Left 08/02/2018   Procedure: ARTERIOVENOUS (AV) FISTULA CREATION  left arm radiocephlic;  Surgeon: Marty Heck, MD;  Location: [employer] [facility];  Service: Vascular;  Laterality: Left;  . BACK SURGERY    . CARDIAC  CATHETERIZATION  10/2002; 12/19/2004   [name] 03/08/2011  . COLONOSCOPY  2013   Dr.John Henrene Pastor   . CORONARY ARTERY BYPASS GRAFT  02/24/2003   CABG X2/notes 03/08/2011  . ESOPHAGOGASTRODUODENOSCOPY (EGD) WITH PROPOFOL N/A 02/08/2019   Procedure: ESOPHAGOGASTRODUODENOSCOPY (EGD) WITH PROPOFOL;  Surgeon: Milus Banister, MD;  Location: [facility];  Service: Gastroenterology;  Laterality: N/A;  . FRACTURE SURGERY    . HOT HEMOSTASIS N/A 02/08/2019   Procedure: HOT HEMOSTASIS (ARGON PLASMA COAGULATION/BICAP);  Surgeon: Milus Banister, MD;  Location: [facility] ENDOSCOPY;  Service: Gastroenterology;  Laterality: N/A;  . INTERTROCHANTERIC HIP FRACTURE SURGERY Left 11/2006   [name] 03/08/2011  . IR FLUORO GUIDE CV LINE RIGHT  07/24/2019  . IR FLUORO GUIDE CV LINE RIGHT  07/30/2019  . IR US GUIDE VASC ACCESS RIGHT  07/24/2019  . IR US GUIDE VASC ACCESS RIGHT  07/30/2019  . LAPAROSCOPIC CHOLECYSTECTOMY  05/2006  . LIGATION OF COMPETING BRANCHES OF ARTERIOVENOUS FISTULA Left 11/05/2018   Procedure: LIGATION OF COMPETING BRANCHES OF ARTERIOVENOUS FISTULA  LEFT  ARM;  Surgeon: Marty Heck, MD;  Location: [place];  Service: Vascular;  Laterality: Left;  . LUMBAR LAMINECTOMY/DECOMPRESSION MICRODISCECTOMY N/A 02/29/2016   Procedure: Left L4-5 Lateral Recess Decompression, Removal Extradural Intraspinal Facet Cyst;  Surgeon: Marybelle Killings, MD;  Location: [place];  Service: Orthopedics;  Laterality: N/A;  . MULTIPLE TOOTH EXTRACTIONS    . ORIF MANDIBULAR FRACTURE Left 08/13/2004   ORIF of left body fracture mandible with KLS Martin 2.3-mm six hole/notes 03/08/2011   HPI:  Jacob [name] is a 68 y.o. male presenting with dyspnea, chest pain. PMH is significant for CHF, CAD, cardiomyopathy, HIV, DMT2, substance use disorder.   Assessment / Plan / Recommendation Clinical Impression  Orders received to administer the [facility] d/t MD suspect that pt might be expereiencing s/s of dementia. It is important to note that the  [facility] is screening measure and a low score would indicate need for further cognitive assessment. Pt obtained a score of 15 out of 30 on the [facility] version 7.1 (n=> 26). Specifically, pt displayed deficits in short term memory, orientation, mental math, problem solving and overall he demonstrated a very poor tolerance to the session. At first pt denied any deficits with memory but as session progressed he stated that he has been experiencing some "issues lately" with remembering "things." It is also important to note that baseline information was not available at the time of this evaluation. Results of assessment were reviewed with pt and all education has been provided. Given difficulty with this screening, would recommend pt follow up with neurology to better determine deficits vs baseline abilities and have medical team assure pt has appropriate support at discharge. All ST services should be deferred to next venue after better assessment for neurological disorder have been established.    SLP Assessment  SLP Recommendation/Assessment: Patient needs continued Speech Lanaguage Pathology Services SLP Visit Diagnosis: Cognitive communication deficit (R41.841)  Follow Up Recommendations  Follow up with outpatient neurology   Frequency and Duration N/A      SLP Evaluation Cognition  Overall Cognitive Status: No family/caregiver present to determine baseline cognitive functioning Arousal/Alertness: Awake/alert Orientation Level: Oriented to person;Oriented to place;Oriented to situation;Disoriented to time Attention: Selective Selective Attention: Impaired Selective Attention Impairment: Verbal basic;Functional basic Memory: Impaired Memory Impairment: Storage deficit;Decreased recall of new information;Decreased short term memory Decreased Short Term Memory: Verbal basic;Functional basic Awareness: Impaired Awareness Impairment: Intellectual impairment Problem Solving: Impaired Problem Solving  Impairment: Verbal complex;Functional complex Behaviors: Restless;Impulsive;Poor frustration tolerance Safety/Judgment: Impaired       Comprehension  Auditory Comprehension Overall Auditory Comprehension: Appears within functional limits for tasks assessed Visual Recognition/Discrimination Discrimination: Within Function Limits Reading Comprehension Reading Status: Not tested    Expression Expression Primary Mode of Expression: Verbal Verbal Expression Overall Verbal Expression: Appears within functional limits for tasks assessed Written Expression Dominant Hand: Right   Oral / Motor  Oral Motor/Sensory Function Overall Oral Motor/Sensory Function: Within functional limits Motor Speech Overall Motor Speech: Appears within functional limits for tasks assessed Respiration: Within functional limits Phonation: Normal Resonance: Within functional limits Articulation: Within functional limitis Intelligibility: Intelligible Motor Planning: Witnin functional limits Motor Speech Errors: Not applicable   GO                    Hanalei Glace 08/01/2019, 7:13 AM

## 2019-08-01 NOTE — Progress Notes (Signed)
FPTS Interim Progress Note  S:Received page that patient was experiencing hand pain. At that time, patient's hand had no swelling or visible changes, patient was given Tylenol.  2:26: Received page that patient continued to have hand pain now with edema. Reported to bedside in order to evaluate patient who reported that his hand pain was not relieved by PO Tylenol nor application of voltaren gel (patient has tube at bedside). Patient denied paresthesias or numbness in his left hand and demonstrated normal range of motion with digital flexion and extension as well as wrist flexion and extension. Pain denies any recent hand trauma.   O: BP (!) 93/43   Pulse 69   Temp 98.8 F (37.1 C) (Oral)   Resp 18   Ht 6\' 2"  (1.88 m)   Wt 84.7 kg   SpO2 95%   BMI 23.97 kg/m   Exam: Left hand with tenderness to palpation on dorsal surface in area of between 4th and 5th digits, no erythema noted on dorsal surface but mild erythema on the hypothenar area that is non tender to palpation, hand does not feel warm to touch and radial pulse on left upper extremity was palpable,  No joint tenderness nor edema at wrist or throughout the hand   A/P: Based on physical exam, patient's pain is likely secondary to MSK cause as it was previously responsive to topical analgesia and Tylenol. Patient planned to have vascular procedure for this extremity later today, so will order imaging to further assess potential etiologies of pain. Less concern for vascular deficiency as patient has palpable pulse in LUE.  -will order ultrasound of left hand  -continue to encourage conservative measures with Kpad and voltaren gel and Tylenol     Stark Klein, MD 08/01/2019, 2:30 AM PGY-1, [facility] Service pager [phone]

## 2019-08-02 ENCOUNTER — Encounter (HOSPITAL_COMMUNITY): Payer: Self-pay | Admitting: Vascular Surgery

## 2019-08-02 ENCOUNTER — Telehealth: Payer: Self-pay | Admitting: *Deleted

## 2019-08-02 DIAGNOSIS — Z7151 Drug abuse counseling and surveillance of drug abuser: Secondary | ICD-10-CM | POA: Insufficient documentation

## 2019-08-02 DIAGNOSIS — N2581 Secondary hyperparathyroidism of renal origin: Secondary | ICD-10-CM | POA: Insufficient documentation

## 2019-08-02 DIAGNOSIS — D688 Other specified coagulation defects: Secondary | ICD-10-CM | POA: Insufficient documentation

## 2019-08-02 DIAGNOSIS — I4891 Unspecified atrial fibrillation: Secondary | ICD-10-CM | POA: Insufficient documentation

## 2019-08-02 DIAGNOSIS — T829XXA Unspecified complication of cardiac and vascular prosthetic device, implant and graft, initial encounter: Secondary | ICD-10-CM | POA: Insufficient documentation

## 2019-08-02 DIAGNOSIS — T7840XA Allergy, unspecified, initial encounter: Secondary | ICD-10-CM | POA: Insufficient documentation

## 2019-08-02 LAB — CBC AND DIFFERENTIAL: Hemoglobin: 9.2 — AB (ref 13.5–17.5)

## 2019-08-02 NOTE — Telephone Encounter (Signed)
I have made the 1st attempt to contact the patient or family member in charge, in order to follow up from recently being discharged from the hospital. I left a message on voicemail but I will make another attempt at a different time.  

## 2019-08-06 ENCOUNTER — Ambulatory Visit (INDEPENDENT_AMBULATORY_CARE_PROVIDER_SITE_OTHER): Payer: Medicare Other | Admitting: Family

## 2019-08-06 ENCOUNTER — Encounter: Payer: Self-pay | Admitting: Family

## 2019-08-06 ENCOUNTER — Telehealth: Payer: Self-pay | Admitting: Internal Medicine

## 2019-08-06 ENCOUNTER — Other Ambulatory Visit: Payer: Self-pay

## 2019-08-06 DIAGNOSIS — E1140 Type 2 diabetes mellitus with diabetic neuropathy, unspecified: Secondary | ICD-10-CM | POA: Diagnosis not present

## 2019-08-06 DIAGNOSIS — I10 Essential (primary) hypertension: Secondary | ICD-10-CM

## 2019-08-06 DIAGNOSIS — I2510 Atherosclerotic heart disease of native coronary artery without angina pectoris: Secondary | ICD-10-CM

## 2019-08-06 DIAGNOSIS — D638 Anemia in other chronic diseases classified elsewhere: Secondary | ICD-10-CM | POA: Diagnosis not present

## 2019-08-06 DIAGNOSIS — R195 Other fecal abnormalities: Secondary | ICD-10-CM | POA: Diagnosis not present

## 2019-08-06 DIAGNOSIS — Z794 Long term (current) use of insulin: Secondary | ICD-10-CM

## 2019-08-06 DIAGNOSIS — N185 Chronic kidney disease, stage 5: Secondary | ICD-10-CM

## 2019-08-06 MED ORDER — UNABLE TO FIND: 0 refills | Status: DC

## 2019-08-06 NOTE — Progress Notes (Signed)
This service is provided via telemedicine  No vital signs collected/recorded due to the encounter was a telemedicine visit.   Location of patient (ex: home, work):  Home   Patient consents to a telephone visit:  Yes  Location of the provider (ex: office, home):  Office  Name of any referring provider:  Sherrie Mustache, NP  Names of all persons participating in the telemedicine service and their role in the encounter:  Marlowe Sax, NP, Ruthell Rummage CMA, and [name]   Time spent on call: Ruthell Rummage CMA, spent 12 Minutes on phone with patient.   Provider: Armonie Mettler FNP-C  [name], NP  Patient Care Team: [name], NP as PCP - General (Geriatric Medicine) [name], MD as PCP - Infectious Diseases (Infectious Diseases) [name], MD as Consulting Physician (Ophthalmology) [name], MD as Consulting Physician (Cardiology) [name], MD as Consulting Physician (Infectious Diseases) Gardiner Barefoot, DPM as Consulting Physician (Podiatry) Madelon Lips, MD as Consulting Physician (Nephrology)  Extended Emergency Contact Information Primary Emergency Contact: Bertrum Sol Address: [address]          [place], [place] [address] [place] Phone: [phone] Mobile Phone: [phone] Relation: Friend Secondary Emergency Contact: Alda Berthold [place] Phone: [phone] Relation: Sister  Code Status: Full Code  Goals of care: Advanced Directive information Advanced Directives 07/21/2019  Does Patient Have a Medical Advance Directive? Yes  Type of Paramedic of [place];Living will  Does patient want to make changes to medical advance directive? No - Guardian declined  Copy of [place] in Chart? No - copy requested  Would patient like information on creating a medical advance directive? -     Chief Complaint  Patient  presents with  . Transitions Of Care    Hospitalization from 07/21/2019 - 08/01/2019 Hypertensive emergency Patient is currently concerned states his sugar recently dropped last night. He is currently diaylisis and would like to discuss current diabetic medications.     HPI:  Pt is a 68 y.o. male seen today for transition of care post hospital admission from 07/21/2019 - 08/01/2019 for hypoxia, chest pain and shortness of breath in the setting of recent cocaine/alcohol use and food/salt indiscretions.Lab work doen showed up trending troponins.EKG done showed diffuse ST depression and had NSTEMI.Cardiology was consulted who recommended angiography but due to his CKD stage V was not on dialysis angiography was deferred until after Hemodialysis.He was febrile and CAP was suspected due to cough.CXR showed infiltrate vs pulmonary edema.He was treated with Doxycycline and Rocephin then was changed to Vacomycin and cefepime due to ongoing fevers.He developed fluid overload but resistance to diuretic.descision was made to start on Hemodialysis.His AV fistula infiltrated.IR was consulted and a temporary catheter was placed and dialysis done.His Hgb was low 7.5 he received one unit of PRBC with much improvement Hgb remained stable 8.0 a combination of anemia of chronic disease and iron deficiency anemia.FOBT positive. Outpatient GI evaluation recommended due to [facility] and new ESRD during hospitalization.Prior to his discharge home he had surgery for a new AV fistula which will need to mature.He was discharged home to continue on Hemodialysis on Monday,Wednesday and Friday as an outpatient.Outpatient follow up with cardiology scheduled for end of October( 08/21/2019).   He states doing well since discharge from the hospital.He states since starting on dialysis his blood sugars have been running low.CBG readings in the 50's-100's.He states mostly 50's 70's His  after meals CBG today was 176.He states did not take his  Humalog insulin.He states eating well and not skipping meals. He is currently taking Tujeo 88 units SQ daily,Humalog 20 units three times daily with meals.   He states was feeling down first day of dialysis having to sit with legs elevated for four hours but was advised to listen to music while on dialysis which has helped but still trying to get used to it.  Medication reconciled.    Past Medical History:  Diagnosis Date  . Acute respiratory failure ([place]) 03/01/2018  . Arthritis    "all over; mostly knees and back" (02/28/2018)  . CHF (congestive heart failure) (H[facility])    not on any meds  . Chronic lower back pain    stenosis  . CKD (chronic kidney disease), stage V ([place])    [name] 02/28/2018  . Community acquired pneumonia 09/06/2013  . Coronary atherosclerosis of native coronary artery 2005   s/p surgery  . Drug abuse ([place])    hx; tested for cocaine as recently as 2/08. says he is not using drugs now - avoided defib. for this reason   . GERD (gastroesophageal reflux disease)    takes OTC meds as needed  . GI bleeding 02/06/2019  . Glaucoma    uses eye drops daily  . Hepatitis B 1968   "tx'd w/isolation; caught it from toilet stools in gym"  . History of blood transfusion 03/01/2019  . History of colon polyps    benign  . History of gout    takes Allopurinol daily as well as Colchicine-if needed (02/28/2018)  . History of kidney stones   . HTN (hypertension)    takes Coreg,Imdur.and Apresoline daily  . Human immunodeficiency virus (HIV) disease ([place]) dx'd 1995   takes Genvoya daily  . Hyperlipidemia    takes Atorvastatin daily  . Ischemic cardiomyopathy   . Muscle spasm    takes Zanaflex as needed  . Myocardial infarction ([place]) ~ 2004/2005  . Nocturia   . Peripheral neuropathy    takes gabapentin daily  . Pneumonia    "at least twice" (02/28/2018)  . Shortness of breath dyspnea    rarely but if notices it then with exertion  . Syphilis, unspecified   . Type II diabetes  mellitus ([place]) 2004   Lantus daily.Average fasting blood sugar 125-199  . Wears glasses   . Wears partial dentures    Past Surgical History:  Procedure Laterality Date  . AV FISTULA PLACEMENT Left 08/02/2018   Procedure: ARTERIOVENOUS (AV) FISTULA CREATION  left arm radiocephlic;  Surgeon: Marty Heck, MD;  Location: [facility];  Service: Vascular;  Laterality: Left;  . AV FISTULA PLACEMENT Left 08/01/2019   Procedure: LEFT BRACHIOCEPHALIC ARTERIOVENOUS (AV) FISTULA CREATION;  Surgeon: [name], MD;  Location: [facility];  Service: Vascular;  Laterality: Left;  . BACK SURGERY    . CARDIAC CATHETERIZATION  10/2002; 12/19/2004   [name] 03/08/2011  . COLONOSCOPY  2013   Dr.John Henrene Pastor   . CORONARY ARTERY BYPASS GRAFT  02/24/2003   CABG X2/notes 03/08/2011  . ESOPHAGOGASTRODUODENOSCOPY (EGD) WITH PROPOFOL N/A 02/08/2019   Procedure: ESOPHAGOGASTRODUODENOSCOPY (EGD) WITH PROPOFOL;  Surgeon: Milus Banister, MD;  Location: [facility];  Service: Gastroenterology;  Laterality: N/A;  . FRACTURE SURGERY    . HOT HEMOSTASIS N/A 02/08/2019   Procedure: HOT HEMOSTASIS (ARGON PLASMA COAGULATION/BICAP);  Surgeon: Milus Banister, MD;  Location[facility];  Service: Gastroenterology;  Laterality: N/A;  . INTERTROCHANTERIC  HIP FRACTURE SURGERY Left 11/2006   [name] 03/08/2011  . IR FLUORO GUIDE CV LINE RIGHT  07/24/2019  . IR FLUORO GUIDE CV LINE RIGHT  07/30/2019  . IR US GUIDE VASC ACCESS RIGHT  07/24/2019  . IR US GUIDE VASC ACCESS RIGHT  07/30/2019  . LAPAROSCOPIC CHOLECYSTECTOMY  05/2006  . LIGATION OF COMPETING BRANCHES OF ARTERIOVENOUS FISTULA Left 11/05/2018   Procedure: LIGATION OF COMPETING BRANCHES OF ARTERIOVENOUS FISTULA  LEFT  ARM;  Surgeon: Marty Heck, MD;  Location: [facility];  Service: Vascular;  Laterality: Left;  . LUMBAR LAMINECTOMY/DECOMPRESSION MICRODISCECTOMY N/A 02/29/2016   Procedure: Left L4-5 Lateral Recess Decompression, Removal Extradural Intraspinal Facet Cyst;   Surgeon: Marybelle Killings, MD;  Location: [facility];  Service: Orthopedics;  Laterality: N/A;  . MULTIPLE TOOTH EXTRACTIONS    . ORIF MANDIBULAR FRACTURE Left 08/13/2004   ORIF of left body fracture mandible with KLS Martin 2.3-mm six hole/notes 03/08/2011    Allergies  Allergen Reactions  . Augmentin [Amoxicillin-Pot Clavulanate] Diarrhea    Severe diarrhea   . Amphetamines Other (See Comments)    Unknown reaction type    Outpatient Encounter Medications as of 08/06/2019  Medication Sig  . acetaminophen (TYLENOL 8 HOUR) 650 MG CR tablet Take 650 mg by mouth every 8 (eight) hours as needed for pain.  [name] allopurinol (ZYLOPRIM) 100 MG tablet TAKE 1 TABLET(100 MG) BY MOUTH DAILY  . aspirin EC 81 MG EC tablet Take 1 tablet (81 mg total) by mouth daily.  [name] atorvastatin (LIPITOR) 80 MG tablet Take 1 tablet (80 mg total) by mouth daily.  . B-D UF III MINI PEN NEEDLES 31G X 5 MM MISC USE FOUR TIMES DAILY  . BIKTARVY 50-200-25 MG TABS tablet TAKE 1 TABLET BY MOUTH DAILY  . Blood Glucose Monitoring Suppl (ONE TOUCH ULTRA MINI) w/Device KIT 1 each by Does not apply route 2 (two) times daily. Dx: E11.40  . calcitRIOL (ROCALTROL) 0.25 MCG capsule Take 1 capsule by mouth daily.  . carvedilol (COREG) 25 MG tablet Take 1 tablet (25 mg total) by mouth 2 (two) times daily with a meal.  . Choline Fenofibrate (FENOFIBRIC ACID) 135 MG CPDR TAKE 1 CAPSULE BY MOUTH DAILY  . diclofenac sodium (VOLTAREN) 1 % GEL Apply 1 application topically 4 (four) times daily as needed for pain.  [name] gabapentin (NEURONTIN) 300 MG capsule Take 1 capsule (300 mg total) by mouth at bedtime.  [name] glucose blood test strip Use to test blood sugar three times daily. E11.40  . Insulin Glargine (TOUJEO MAX SOLOSTAR [place]) Inject 88 Units into the skin every morning.  . insulin lispro (HUMALOG) 100 UNIT/ML injection Inject 20 Units into the skin 3 (three) times daily before meals.  . isosorbide mononitrate (IMDUR) 30 MG 24 hr tablet TAKE 1 TABLET(30  MG) BY MOUTH DAILY  . Lancets (ONETOUCH DELICA PLUS [id number]) MISC 1 each by Does not apply route 3 (three) times daily. Use to test blood sugar three times daily. Dx: E11.40  . latanoprost (XALATAN) 0.005 % ophthalmic solution Place 1 drop into both eyes at bedtime. Reported on 04/04/2016  . nitroGLYCERIN (NITROSTAT) 0.3 MG SL tablet ONE TABLET UNDER TONGUE AS NEEDED FOR CHEST PAIN  . pantoprazole (PROTONIX) 40 MG tablet Take 1 tablet (40 mg total) by mouth daily.  . sevelamer carbonate (RENVELA) 800 MG tablet Take 1 tablet (800 mg total) by mouth 3 (three) times daily with meals.  . simethicone (MYLICON) 80 MG chewable tablet Chew 1 tablet (  80 mg total) by mouth 4 (four) times daily as needed for flatulence.  . timolol (TIMOPTIC) 0.5 % ophthalmic solution Place 1 drop into both eyes every morning.  . umeclidinium-vilanterol (ANORO ELLIPTA) 62.5-25 MCG/INH AEPB INHALE 1 PUFF BY MOUTH EVERY DAY  . [DISCONTINUED] Insulin Glargine, 2 Unit Dial, (TOUJEO MAX SOLOSTAR) 300 UNIT/ML SOPN Inject into the skin.  . [DISCONTINUED] Cholecalciferol (VITAMIN D3) 50 MCG (2000 UT) TABS Take 2,000 Units by mouth daily.  . [DISCONTINUED] Glucosamine-Chondroit-Vit C-Mn (GLUCOSAMINE CHONDR 1500 COMPLX PO) Take 2 tablets by mouth daily.   . [DISCONTINUED] hydrALAZINE (APRESOLINE) 25 MG tablet TAKE 1 TABLET(25 MG) BY MOUTH THREE TIMES DAILY (Patient taking differently: Take 25 mg by mouth 3 (three) times daily. )  . [DISCONTINUED] Insulin Glargine, 2 Unit Dial, (TOUJEO MAX SOLOSTAR) 300 UNIT/ML SOPN Inject 6 Units into the skin daily.  . [DISCONTINUED] sodium bicarbonate 650 MG tablet Take 650 mg by mouth 2 (two) times daily.    No facility-administered encounter medications on file as of 08/06/2019.     Review of Systems  Constitutional: Negative for activity change, appetite change, chills, fatigue and fever.  HENT: Negative for congestion, rhinorrhea, sinus pressure, sinus pain, sneezing and sore throat.   Eyes:  Negative for discharge, redness and itching.  Respiratory: Negative for cough, chest tightness, shortness of breath and wheezing.   Cardiovascular: Negative for chest pain, palpitations and leg swelling.  Gastrointestinal: Negative for abdominal distention, abdominal pain, constipation, diarrhea, nausea and vomiting.  Endocrine: Negative for cold intolerance, heat intolerance, polydipsia, polyphagia and polyuria.  Genitourinary: Negative for difficulty urinating, dysuria, flank pain, frequency and urgency.       Recently started on Hemodialysis   Musculoskeletal: Negative for arthralgias and gait problem.  Skin: Negative for color change, pallor and rash.       Reports left forearm fistula and right [place] HD access   Neurological: Negative for dizziness, light-headedness, numbness and headaches.  Hematological: Does not bruise/bleed easily.  Psychiatric/Behavioral: Negative for agitation, confusion and sleep disturbance. The patient is not nervous/anxious.     Immunization History  Administered Date(s) Administered  . Fluad Quad(high Dose 65+) 06/14/2019  . Hepatitis B 08/28/2006, 10/02/2007, 04/01/2008  . Hepatitis B, adult 06/03/2014, 07/04/2014  . Influenza Split 07/28/2011  . Influenza Whole 08/28/2006, 09/10/2007, 09/15/2008, 08/03/2009, 07/26/2010  . Influenza, High Dose Seasonal PF 07/04/2018  . Influenza,inj,Quad PF,6+ Mos 07/04/2014, 07/06/2015, 07/12/2016, 07/11/2017  . Influenza-Unspecified 06/24/2013  . Pneumococcal Conjugate-13 08/18/2016  . Pneumococcal Polysaccharide-23 08/28/2006, 07/28/2011, 05/30/2018  . Tdap 06/14/2019  . Zoster 06/03/2014  . Zoster Recombinat (Shingrix) 07/24/2017, 01/09/2018   Pertinent  Health Maintenance Due  Topic Date Due  . OPHTHALMOLOGY EXAM  07/10/2019  . HEMOGLOBIN A1C  09/04/2019  . FOOT EXAM  12/06/2019  . LIPID PANEL  06/03/2020  . COLONOSCOPY  10/01/2022  . INFLUENZA VACCINE  Completed  . PNA vac Low Risk Adult  Completed   Fall  Risk  08/06/2019 07/08/2019 06/28/2019 06/18/2019 06/04/2019  Falls in the past year? 0 0 0 0 0  Comment - - - - -  Number falls in past yr: 0 0 0 0 0  Comment - - - - -  Injury with Fall? 0 0 0 0 0  Comment - - - - -  Risk Factor Category  - - - - -  Risk for fall due to : - - - - -  Risk for fall due to: Comment - - - - -  Follow up - - - - -  There were no vitals filed for this visit. There is no height or weight on file to calculate BMI. Physical Exam  Unable to complete on Telephone visit.   Labs reviewed: Recent Labs    07/21/19 0928  07/30/19 1219 07/31/19 0318 08/01/19 0325  NA 140   < > 132* 131* 132*  K 4.5   < > 3.9 4.1 3.8  CL 108   < > 96* 96* 95*  CO2 23   < > 19* 18* 20*  GLUCOSE 110*   < > 155* 202* 205*  BUN 72*   < > 99* 117* 65*  CREATININE 5.55*   < > 7.39* 7.77* 5.73*  CALCIUM 9.0   < > 8.4* 8.4* 8.5*  MG 1.8  --   --   --   --   PHOS 5.1*   < > 7.5* 8.2* 6.5*   < > = values in this interval not displayed.   Recent Labs    02/07/19 0642 06/24/19 1027 07/21/19 0928  07/23/19 0608  07/30/19 1219 07/31/19 0318 08/01/19 0325  AST 26 30 95*  --  47*  --   --   --   --   ALT 21 25 51*  --  34  --   --   --   --   ALKPHOS 40  --  42  --  43  --   --   --   --   BILITOT 0.9 0.4 0.7  --  1.2  --   --   --   --   PROT 7.6 7.8 7.7  --  7.6  --   --   --   --   ALBUMIN 3.4*  --  3.2*   < > 3.0*   < > 2.2* 2.0* 2.2*   < > = values in this interval not displayed.   Recent Labs    02/06/19 1045  07/21/19 0928  07/29/19 0357 07/30/19 1219 07/31/19 0318 08/01/19 0325  WBC 5.8   < > 7.2   < > 6.7 7.0 7.0 7.8  NEUTROABS 2.7  --  5.6  --  4.4  --   --   --   HGB 6.4*   < > 9.7*   < > 8.2* 9.7* 8.8* 9.4*  HCT 20.3*   < > 28.9*   < > 24.4* 29.3* 25.8* 27.3*  MCV 96.2   < > 100.3*   < > 96.1 96.1 93.8 92.2  PLT 199   < > 200   < > 205 280 284 298   < > = values in this interval not displayed.   Lab Results  Component Value Date   TSH 0.81 07/18/2017    Lab Results  Component Value Date   HGBA1C 9.2 (H) 06/04/2019   Lab Results  Component Value Date   CHOL 158 06/04/2019   HDL 19 (L) 06/04/2019   [place]  06/04/2019     Comment:     . LDL cholesterol not calculated. Triglyceride levels greater than 400 mg/dL invalidate calculated LDL results. . Reference range: <100 . Desirable range <100 mg/dL for primary prevention;   <70 mg/dL for patients with CHD or diabetic patients  with > or = 2 CHD risk factors. [name] LDL-C is now calculated using the Martin-Hopkins  calculation, which is a validated novel method providing  better accuracy than the Friedewald equation in the  estimation of LDL-C.  Cresenciano Genre et al. Annamaria Helling. 8453;646(80): [phone]  (  http://education.[facility].com/faq/FAQ164)    TRIG 574 (H) 06/04/2019   CHOLHDL 8.3 (H) 06/04/2019    Significant Diagnostic Results in last 30 days:  Dg Chest 2 View  Result Date: 07/25/2019 CLINICAL DATA:  Chest pain shortness of breath. EXAM: CHEST - 2 VIEW COMPARISON:  07/22/2019 FINDINGS: Heart size is enlarged following median sternotomy and CABG. Aortic atherosclerosis is demonstrated. Placement of right IJ catheter since previous exam. Bilateral interstitial prominence patchy opacities similar to prior study. Signs of remote trauma to right ribs as before. IMPRESSION: Interval placement of right IJ dialysis catheter. No signs of pneumothorax. Signs of pulmonary edema with patchy opacities, superimposed infection not excluded. Electronically Signed   By: Zetta Bills M.D.   On: 07/25/2019 15:45   Dg Chest 2 View  Result Date: 07/22/2019 CLINICAL DATA:  Respiratory distress.  Cough. EXAM: CHEST - 2 VIEW COMPARISON:  July 21, 2019 FINDINGS: Stable cardiomegaly. The hila and mediastinum are normal. No pneumothorax. Diffuse bilateral pulmonary opacities are identified, worrisome for edema. More focal opacity in the medial right lung base is stable to mildly more prominent which  could represent consolidative edema versus a superimposed infiltrate. IMPRESSION: Pulmonary edema. More focal opacity in the medial right lung base is stable to slightly more prominent. This could be part of the pulmonary edema or a superimposed infection. Electronically Signed   By: Dorise Bullion III M.D   On: 07/22/2019 15:07   Dg Abd 1 View  Result Date: 07/23/2019 CLINICAL DATA:  Persistent abdominal pain EXAM: ABDOMEN - 1 VIEW COMPARISON:  CT 11/03/2015 FINDINGS: Vascular calcifications in the pelvis. Prior cholecystectomy. No bowel obstruction, free air, or organomegaly. IMPRESSION: No acute findings. Electronically Signed   By: Rolm Baptise M.D.   On: 07/23/2019 11:17   US Abdomen Complete  Result Date: 07/23/2019 CLINICAL DATA:  Abdominal distension. EXAM: ABDOMEN ULTRASOUND COMPLETE COMPARISON:  CT scan of November 03, 2015. FINDINGS: Gallbladder: Status post cholecystectomy. Common bile duct: Diameter: Not visualized, but no definite dilatation is noted. Liver: No focal lesion identified. Increased echogenicity of hepatic parenchyma is noted. Portal vein is patent on color Doppler imaging with normal direction of blood flow towards the liver. IVC: No abnormality visualized. Pancreas: Not visualized due to overlying bowel gas. Spleen: Size and appearance within normal limits. Right Kidney: Length: 12.1 cm. 1 cm cyst is noted in lower pole. Increased echogenicity of renal parenchyma is noted. No mass or hydronephrosis visualized. Left Kidney: Length: 12.9 cm. 8 mm cyst is noted in midpole. Increased echogenicity of renal parenchyma is noted. No mass or hydronephrosis visualized. Abdominal aorta: No aneurysm visualized. Other findings: None. IMPRESSION: Increased echogenicity of renal parenchyma is noted suggesting hepatic steatosis. Increased echogenicity of renal parenchyma is noted bilaterally suggesting medical renal disease. No hydronephrosis or renal obstruction is noted. Small bilateral simple  renal cysts are noted. Pancreas not visualized due to overlying bowel gas. Status post cholecystectomy. Electronically Signed   By: Marijo Conception M.D.   On: 07/23/2019 15:24   Ir Fluoro Guide Cv Line Right  Result Date: 07/30/2019 INDICATION: 68 year old male with a history of renal failure EXAM: IMAGE GUIDED PLACEMENT OF TUNNELED HEMODIALYSIS CATHETER MEDICATIONS: 2 g Ancef; The antibiotic was administered within an appropriate time interval prior to skin puncture. ANESTHESIA/SEDATION: Moderate (conscious) sedation was employed during this procedure. A total of Versed 1.0 mg and Fentanyl 50 mcg was administered intravenously. Moderate Sedation Time: 14 minutes. The patient's level of consciousness and vital signs were monitored continuously by radiology nursing  throughout the procedure under my direct supervision. FLUOROSCOPY TIME:  Fluoroscopy Time: 1 minutes 0 seconds (7 mGy). COMPLICATIONS: None PROCEDURE: Informed written consent was obtained from the patient after a discussion of the risks, benefits, and alternatives to treatment. Questions regarding the procedure were encouraged and answered. The right neck and chest were prepped with chlorhexidine in a sterile fashion, and a sterile drape was applied covering the operative field. Maximum barrier sterile technique with sterile gowns and gloves were used for the procedure. A timeout was performed prior to the initiation of the procedure. After creating a small venotomy incision, a micropuncture kit was utilized to access the right internal jugular vein under direct, real-time ultrasound guidance after the overlying soft tissues were anesthetized with 1% lidocaine with epinephrine. Ultrasound image documentation was performed. The microwire was marked to measure appropriate internal catheter length. External tunneled length was estimated. A total tip to cuff length of 19 cm was selected. Skin and subcutaneous tissues of chest wall below the clavicle were  generously infiltrated with 1% lidocaine for local anesthesia. A small stab incision was made with 11 blade scalpel. The selected hemodialysis catheter was tunneled in a retrograde fashion from the anterior chest wall to the venotomy incision. A guidewire was advanced to the level of the IVC and the micropuncture sheath was exchanged for a peel-away sheath. The catheter was then placed through the peel-away sheath with tips ultimately positioned within the superior aspect of the right atrium. Final catheter positioning was confirmed and documented with a spot radiographic image. The catheter aspirates and flushes normally. The catheter was flushed with appropriate volume heparin dwells. The catheter exit site was secured with a 0-Prolene retention suture. The venotomy incision was closed Derma bond and sterile dressing. Dressings were applied at the chest wall. Patient tolerated the procedure well and remained hemodynamically stable throughout. No complications were encountered and no significant blood loss encountered. IMPRESSION: Status post right IJ tunneled hemodialysis catheter placement. Catheter ready for use. Signed, Dulcy Fanny. Dellia Nims, RPVI [facility] Electronically Signed   By: Corrie Mckusick D.O.   On: 07/30/2019 12:31   Ir Fluoro Guide Cv Line Right  Result Date: 07/24/2019 INDICATION: End-stage renal disease requiring dialysis. EXAM: FLUOROSCOPIC AND ULTRASOUND GUIDED PLACEMENT OF A NON-TUNNELED DIALYSIS CATHETER MEDICATIONS: None ANESTHESIA/SEDATION: Local anesthetic only FLUOROSCOPY TIME:  Fluoroscopy Time: 0 minutes 18 seconds (2 mGy). COMPLICATIONS: None immediate. PROCEDURE: Informed consent was obtained for catheter placement. The patient was placed supine on the interventional table. Ultrasound confirmed a patent right internal jugular vein. Ultrasound images were obtained for documentation. The right neck was prepped and draped in a  sterile fashion. The right neck was anesthetized with 1% lidocaine. Maximal barrier sterile technique was utilized including caps, mask, sterile gowns, sterile gloves, sterile drape, hand hygiene and skin antiseptic. A small incision was made with #11 blade scalpel. A 21 gauge needle directed into the right internal jugular vein with ultrasound guidance. A micropuncture dilator set was placed. A 16 cm Mahurkar catheter was selected. The catheter was advanced over a wire and positioned at the superior cavoatrial junction. Fluoroscopic images were obtained for documentation. Both dialysis lumens were found to aspirate and flush well. The proper amount of heparin was flushed in both lumens. The central venous lumen was flushed with normal saline. Catheter was sutured to skin. FINDINGS: Catheter tip at the superior cavoatrial junction. IMPRESSION: Successful placement of a 16 cm non-tunneled dialysis catheter using ultrasound and fluoroscopic guidance.  The catheter is ready for immediate use as clinically indicated. Electronically Signed   By: Constance Holster M.D.   On: 07/24/2019 13:41   Ir US Guide Vasc Access Right  Result Date: 07/30/2019 INDICATION: 68 year old male with a history of renal failure EXAM: IMAGE GUIDED PLACEMENT OF TUNNELED HEMODIALYSIS CATHETER MEDICATIONS: 2 g Ancef; The antibiotic was administered within an appropriate time interval prior to skin puncture. ANESTHESIA/SEDATION: Moderate (conscious) sedation was employed during this procedure. A total of Versed 1.0 mg and Fentanyl 50 mcg was administered intravenously. Moderate Sedation Time: 14 minutes. The patient's level of consciousness and vital signs were monitored continuously by radiology nursing throughout the procedure under my direct supervision. FLUOROSCOPY TIME:  Fluoroscopy Time: 1 minutes 0 seconds (7 mGy). COMPLICATIONS: None PROCEDURE: Informed written consent was obtained from the patient after a discussion of the risks,  benefits, and alternatives to treatment. Questions regarding the procedure were encouraged and answered. The right neck and chest were prepped with chlorhexidine in a sterile fashion, and a sterile drape was applied covering the operative field. Maximum barrier sterile technique with sterile gowns and gloves were used for the procedure. A timeout was performed prior to the initiation of the procedure. After creating a small venotomy incision, a micropuncture kit was utilized to access the right internal jugular vein under direct, real-time ultrasound guidance after the overlying soft tissues were anesthetized with 1% lidocaine with epinephrine. Ultrasound image documentation was performed. The microwire was marked to measure appropriate internal catheter length. External tunneled length was estimated. A total tip to cuff length of 19 cm was selected. Skin and subcutaneous tissues of chest wall below the clavicle were generously infiltrated with 1% lidocaine for local anesthesia. A small stab incision was made with 11 blade scalpel. The selected hemodialysis catheter was tunneled in a retrograde fashion from the anterior chest wall to the venotomy incision. A guidewire was advanced to the level of the IVC and the micropuncture sheath was exchanged for a peel-away sheath. The catheter was then placed through the peel-away sheath with tips ultimately positioned within the superior aspect of the right atrium. Final catheter positioning was confirmed and documented with a spot radiographic image. The catheter aspirates and flushes normally. The catheter was flushed with appropriate volume heparin dwells. The catheter exit site was secured with a 0-Prolene retention suture. The venotomy incision was closed Derma bond and sterile dressing. Dressings were applied at the chest wall. Patient tolerated the procedure well and remained hemodynamically stable throughout. No complications were encountered and no significant blood  loss encountered. IMPRESSION: Status post right IJ tunneled hemodialysis catheter placement. Catheter ready for use. Signed, Dulcy Fanny. Dellia Nims, RPVI [facility] Radiology Electronically Signed   By: Corrie Mckusick D.O.   On: 07/30/2019 12:31   Ir US Guide Vasc Access Right  Result Date: 07/24/2019 INDICATION: End-stage renal disease requiring dialysis. EXAM: FLUOROSCOPIC AND ULTRASOUND GUIDED PLACEMENT OF A NON-TUNNELED DIALYSIS CATHETER MEDICATIONS: None ANESTHESIA/SEDATION: Local anesthetic only FLUOROSCOPY TIME:  Fluoroscopy Time: 0 minutes 18 seconds (2 mGy). COMPLICATIONS: None immediate. PROCEDURE: Informed consent was obtained for catheter placement. The patient was placed supine on the interventional table. Ultrasound confirmed a patent right internal jugular vein. Ultrasound images were obtained for documentation. The right neck was prepped and draped in a sterile fashion. The right neck was anesthetized with 1% lidocaine. Maximal barrier sterile technique was utilized including caps, mask, sterile gowns, sterile gloves, sterile drape, hand hygiene and skin antiseptic. A small  incision was made with #11 blade scalpel. A 21 gauge needle directed into the right internal jugular vein with ultrasound guidance. A micropuncture dilator set was placed. A 16 cm Mahurkar catheter was selected. The catheter was advanced over a wire and positioned at the superior cavoatrial junction. Fluoroscopic images were obtained for documentation. Both dialysis lumens were found to aspirate and flush well. The proper amount of heparin was flushed in both lumens. The central venous lumen was flushed with normal saline. Catheter was sutured to skin. FINDINGS: Catheter tip at the superior cavoatrial junction. IMPRESSION: Successful placement of a 16 cm non-tunneled dialysis catheter using ultrasound and fluoroscopic guidance. The catheter is ready for immediate use as clinically  indicated. Electronically Signed   By: Constance Holster M.D.   On: 07/24/2019 13:41   Dg Chest Port 1 View  Result Date: 07/21/2019 CLINICAL DATA:  Pt arrives via [facility] from home with c/o sob that began a few hours pta, pt also reports new onset of cough this am after he smoked. Ems states O2 sats were in the low 80s upon their arrival, O2 sats improved to 99% on NRB, lung s.*comment was truncated*sob EXAM: PORTABLE CHEST 1 VIEW COMPARISON:  Radiograph 02/06/2019 FINDINGS: Sternotomy wires overlie normal cardiac silhouette. Fine interstitial pattern throughout the lungs suggests interstitial edema. No clear pleural fluid. No focal consolidation. Remote RIGHT rib fractures. IMPRESSION: Interstitial edema pattern. Electronically Signed   By: Suzy Bouchard M.D.   On: 07/21/2019 09:29   Vas US Duplex Dialysis Access (avf, Avg)  Result Date: 07/27/2019 DIALYSIS ACCESS Reason for Exam: Mechanical complication AVF. Access Site: Left Upper Extremity. Access Type: Radial-cephalic AVF. Limitations: Poor patient cooperation- patient having stomach pains. Comparison Study: 12/18/2018- dialysis access duplex Performing Technologist: Maudry Mayhew MHA, RDMS, RVT, RDCS  Examination Guidelines: A complete evaluation includes B-mode imaging, spectral Doppler, color Doppler, and power Doppler as needed of all accessible portions of each vessel. Unilateral testing is considered an integral part of a complete examination. Limited examinations for reoccurring indications may be performed as noted.  Findings: +--------------------+----------+-----------------+--------+ AVF                 PSV (cm/s)Flow Vol (mL/min)Comments +--------------------+----------+-----------------+--------+ Native artery inflow   207           377                +--------------------+----------+-----------------+--------+ AVF Anastomosis         53                               +--------------------+----------+-----------------+--------+  +------------+----------+-------------+----------+--------+ OUTFLOW VEINPSV (cm/s)Diameter (cm)Depth (cm)Describe +------------+----------+-------------+----------+--------+ Mid Forearm    210                                    +------------+----------+-------------+----------+--------+ Dist Forearm   199                                    +------------+----------+-------------+----------+--------+   Summary: Flow volume is decreased when compared to prior study. No obvious evidence of competing branches in the mid to distal forearm.  *See table(s) above for measurements and observations.  Diagnosing physician: Monica Martinez MD Electronically signed by Monica Martinez MD on 07/27/2019 at 5:47:42 PM.   --------------------------------------------------------------------------------   Final  [place] Soft Tissue Non Vascular  Result Date: 08/01/2019 CLINICAL DATA:  Left hand pain. EXAM: ULTRASOUND LEFT UPPER EXTREMITY LIMITED TECHNIQUE: Ultrasound examination of the upper extremity soft tissues was performed in the area of clinical concern. COMPARISON:  None. FINDINGS: There is a 6 mm complex cystic area which appears to contain blood flow in the area of the patient's pain on the dorsum of the hand. I doubt that this is clinically significant. No ganglion cysts or solid mass lesions. IMPRESSION: No definitive abnormality in the area of pain. Electronically Signed   By: Lorriane Shire M.D.   On: 08/01/2019 07:52    Assessment/Plan 1. Essential hypertension No home blood pressure readings for review but states B/p checked during dialysis has been stable.continue on current medication.  2. Type 2 diabetes mellitus with diabetic neuropathy, with long-term current use of insulin (HCC) Several low CBG readings post hospital discharged.Discussed with patient to check blood sugars prior to administering insulin.He  verbalized understanding. Will reduce Tujeo from 88 units to 80 units SQ daily hold if CBG < 100 . Also advised to use Humalog insulin 20 units SQ three times daily before meals if blood sugars are > 150.patient to notify provider's office if CBG readings are in the 200's and above.   3. Fecal occult blood test positive Status post 1 unit PRBC transfusion during hospital admission.FOBT positive. - Ambulatory referral to Gastroenterology for evaluation for possible Gastrointestinal bleed.   4. Anemia of chronic disease Hgb 7.5 >8.0  - Ambulatory referral to Gastroenterology  5. Coronary artery disease involving native coronary artery of native heart without angina pectoris Declines any chest pain this visit.Had NSTEMI during recent hospitalization.Has upcoming appointment with cardiology 08/21/2019.continue on ASA 81 mg tablet daily,Coreg 25 mg tablet twice daily,Imdur 30 mg 24 HR tablet daily and Nitro 0.3 mg SL PRN.on Lipitor 80 mg tablet daily.   6. CKD (chronic kidney disease), stage V (HCC) CR 7.77 > 5.73 new ESRD on Hemodialysis Monday,/wednesday and Thursday. Continue to follow up Nephrology. Avoid nephrotoxins and dose other medication for renal clearance.    Family/ staff Communication: Reviewed plan of care with patient.  Labs/tests ordered: None  Spent 23 minutes of non-face to face with patient   [name], NP

## 2019-08-06 NOTE — Telephone Encounter (Signed)
I have made the 2nd attempt to contact the patient or family member in charge, in order to follow up from recently being discharged from the hospital. I left a message on voicemail but I will make another attempt at a different time.  

## 2019-08-06 NOTE — Telephone Encounter (Signed)
Hi Linda, we have received a referral from pt's PCP for anemia and blood in stool. I called pt to schedule him for an appt with Dr. Henrene Pastor but he stated that last time he spoke with Dr. Henrene Pastor he told him that he could not do a procedure on him so pt is confused about how we can help him. He states that he is on dialysis and he can only do a virtual visit over the phone. Please advise if an appt if appropriate. Thank you/

## 2019-08-06 NOTE — Telephone Encounter (Signed)
Transition Care Management Follow-up Telephone Call  Date of discharge and from where: 08/01/2019 [facility]  How have you been since you were released from the hospital? Better, Blood sugar running low  Any questions or concerns? Yes  Wonders if he should continue taking Toujeo   Items Reviewed:  Did the pt receive and understand the discharge instructions provided? Yes   Medications obtained and verified? Yes   Any new allergies since your discharge? No   Dietary orders reviewed? Yes  Do you have support at home? Yes , son  Other (ie: DME, Home Health, etc) no  Functional Questionnaire: (I = Independent and D = Dependent) ADL's: I  Bathing/Dressing- I   Meal Prep- I  Eating- I  Maintaining continence- I  Transferring/Ambulation- I  Managing Meds- I   Follow up appointments reviewed:    PCP Hospital f/u appt confirmed? Yes  Scheduled to see Dinah on 08/06/19.  [facility] f/u appt confirmed? Yes  .  Are transportation arrangements needed? No   If their condition worsens, is the pt aware to call  their PCP or go to the ED? Yes  Was the patient provided with contact information for the PCP's office or ED? Yes  Was the pt encouraged to call back with questions or concerns? Yes

## 2019-08-07 DIAGNOSIS — E1140 Type 2 diabetes mellitus with diabetic neuropathy, unspecified: Secondary | ICD-10-CM | POA: Insufficient documentation

## 2019-08-07 LAB — CBC AND DIFFERENTIAL: Hemoglobin: 9 — AB (ref 13.5–17.5)

## 2019-08-07 NOTE — Telephone Encounter (Signed)
1. Extensive hospital stay reviewed. 2. Blood counts stable. Reason for heme + stools understood (that is why they asked him to follow up)- AVMs 3. Anemia multi factorial (renal failure, chronic disease, AVMs) 4. Now that he is on dialysis, renal can monitor blood counts regularly and transfuse prn. 4. Should be on iron daily. He should discuss Epogen, if not on, with renal. 5. No GI follow up at this time needed 6. For overt bleeding, he should go to the hospital for care Thank him for calling. JP

## 2019-08-07 NOTE — Telephone Encounter (Signed)
Cancel recent GI referral and follow up with Dr.Perry as directed.

## 2019-08-07 NOTE — Telephone Encounter (Signed)
Pt was recently hospitalized. Being referred again for anemia and melena. Pt is currently on dialysis and states he can only do virtual visits. Please advise if ok to schedule for virtual. Pt was seen in July and told to follow up in 6 months. Please advise.

## 2019-08-07 NOTE — Telephone Encounter (Signed)
Pt aware. Copy of note sent to referring provider.

## 2019-08-09 ENCOUNTER — Other Ambulatory Visit: Payer: Self-pay | Admitting: Nurse Practitioner

## 2019-08-09 DIAGNOSIS — I5042 Chronic combined systolic (congestive) and diastolic (congestive) heart failure: Secondary | ICD-10-CM

## 2019-08-14 LAB — CBC AND DIFFERENTIAL: Hemoglobin: 9.6 — AB (ref 13.5–17.5)

## 2019-08-15 ENCOUNTER — Encounter: Payer: Self-pay | Admitting: Family

## 2019-08-15 ENCOUNTER — Other Ambulatory Visit: Payer: Self-pay

## 2019-08-15 ENCOUNTER — Ambulatory Visit (INDEPENDENT_AMBULATORY_CARE_PROVIDER_SITE_OTHER): Payer: Medicare Other | Admitting: Family

## 2019-08-15 ENCOUNTER — Telehealth: Payer: Self-pay

## 2019-08-15 DIAGNOSIS — Z794 Long term (current) use of insulin: Secondary | ICD-10-CM

## 2019-08-15 DIAGNOSIS — E1140 Type 2 diabetes mellitus with diabetic neuropathy, unspecified: Secondary | ICD-10-CM

## 2019-08-15 NOTE — Progress Notes (Signed)
This service is provided via telemedicine  No vital signs collected/recorded due to the encounter was a telemedicine visit.   Location of patient (ex: home, work):  Home   Patient consents to a telephone visit:  Yes  Location of the provider (ex: office, home):  Office   Name of any referring provider:  Sherrie Mustache, NP   Names of all persons participating in the telemedicine service and their role in the encounter: Marlowe Sax, NP, Ruthell Rummage, CMA and Patient   Time spent on call:  Ruthell Rummage CMA,  Spent 9 minutes on phone with patient     [facility] clinic  Provider: Marlowe Sax, NP   Code Status: FULL Goals of Care:  Advanced Directives 07/21/2019  Does Patient Have a Medical Advance Directive? Yes  Type of Paramedic of [name];Living will  Does patient want to make changes to medical advance directive? No - Guardian declined  Copy of [place] in Chart? No - copy requested  Would patient like information on creating a medical advance directive? -     Chief Complaint  Patient presents with   Acute Visit    Patient is concerned with blood sugar    HPI: Patient is a 68 y.o. male seen today for an acute visit for evaluation of blood sugars.He states since starting dialysis  his blood sugars have been low ranging in the 110's - 130's.He states has stopped using Tujeo injection.He would like to know what he can do with the rest of the Tujeo pen injection.i've discussed with him to check with the pharmacist options if Tujeo injection are not opened.He verbalized understanding.Also takes Humalog 20 units three times before meals if blood sugars are greater than 150.He has not required Humalog injection.  Past Medical History:  Diagnosis Date   Acute respiratory failure ([place]) 03/01/2018   Arthritis    "all over; mostly knees and back" (02/28/2018)   CHF (congestive heart failure) (H[facility])    not on any meds   Chronic lower  back pain    stenosis   CKD (chronic kidney disease), stage V ([place])    [name] 02/28/2018   Community acquired pneumonia 09/06/2013   Coronary atherosclerosis of native coronary artery 2005   s/p surgery   Drug abuse ([place])    hx; tested for cocaine as recently as 2/08. says he is not using drugs now - avoided defib. for this reason    GERD (gastroesophageal reflux disease)    takes OTC meds as needed   GI bleeding 02/06/2019   Glaucoma    uses eye drops daily   Hepatitis B 1968   "tx'd w/isolation; caught it from toilet stools in gym"   History of blood transfusion 03/01/2019   History of colon polyps    benign   History of gout    takes Allopurinol daily as well as Colchicine-if needed (02/28/2018)   History of kidney stones    HTN (hypertension)    takes Coreg,Imdur.and Apresoline daily   Human immunodeficiency virus (HIV) disease ([place]) dx'd 1995   takes Genvoya daily   Hyperlipidemia    takes Atorvastatin daily   Ischemic cardiomyopathy    Muscle spasm    takes Zanaflex as needed   Myocardial infarction ([place]) ~ 2004/2005   Nocturia    Peripheral neuropathy    takes gabapentin daily   Pneumonia    "at least twice" (02/28/2018)   Shortness of breath dyspnea    rarely but if  notices it then with exertion   Syphilis, unspecified    Type II diabetes mellitus ([place]) 2004   Lantus daily.Average fasting blood sugar 125-199   Wears glasses    Wears partial dentures     Past Surgical History:  Procedure Laterality Date   AV FISTULA PLACEMENT Left 08/02/2018   Procedure: ARTERIOVENOUS (AV) FISTULA CREATION  left arm radiocephlic;  Surgeon: Marty Heck, MD;  Location: [facility] OR;  Service: Vascular;  Laterality: Left;   AV FISTULA PLACEMENT Left 08/01/2019   Procedure: LEFT BRACHIOCEPHALIC ARTERIOVENOUS (AV) FISTULA CREATION;  Surgeon: [name], MD;  Location: [facility] OR;  Service: Vascular;  Laterality: Left;   [place]  10/2002; 12/19/2004   [name] 03/08/2011   COLONOSCOPY  2013   [place]    CORONARY ARTERY BYPASS GRAFT  02/24/2003   CABG X2/notes 03/08/2011   ESOPHAGOGASTRODUODENOSCOPY (EGD) WITH PROPOFOL N/A 02/08/2019   Procedure: ESOPHAGOGASTRODUODENOSCOPY (EGD) WITH PROPOFOL;  Surgeon: Milus Banister, MD;  Location: [facility];  Service: Gastroenterology;  Laterality: N/A;   FRACTURE SURGERY     HOT HEMOSTASIS N/A 02/08/2019   Procedure: HOT HEMOSTASIS (ARGON PLASMA COAGULATION/BICAP);  Surgeon: Milus Banister, MD;  Location[facility];  Service: Gastroenterology;  Laterality: N/A;   INTERTROCHANTERIC HIP FRACTURE SURGERY Left 11/2006   [name] 03/08/2011   IR FLUORO GUIDE CV LINE RIGHT  07/24/2019   IR FLUORO GUIDE CV LINE RIGHT  07/30/2019   IR US GUIDE VASC ACCESS RIGHT  07/24/2019   IR US GUIDE VASC ACCESS RIGHT  07/30/2019   LAPAROSCOPIC CHOLECYSTECTOMY  05/2006   LIGATION OF COMPETING BRANCHES OF ARTERIOVENOUS FISTULA Left 11/05/2018   Procedure: LIGATION OF COMPETING BRANCHES OF ARTERIOVENOUS FISTULA  LEFT  ARM;  Surgeon: Marty Heck, MD;  Location: [facility] OR;  Service: Vascular;  Laterality: Left;   LUMBAR LAMINECTOMY/DECOMPRESSION MICRODISCECTOMY N/A 02/29/2016   Procedure: Left L4-5 Lateral Recess Decompression, Removal Extradural Intraspinal Facet Cyst;  Surgeon: Marybelle Killings, MD;  Location: [facility];  Service: Orthopedics;  Laterality: N/A;   MULTIPLE TOOTH EXTRACTIONS     ORIF MANDIBULAR FRACTURE Left 08/13/2004   ORIF of left body fracture mandible with KLS Martin 2.3-mm six hole/notes 03/08/2011    Allergies  Allergen Reactions   Augmentin [Amoxicillin-Pot Clavulanate] Diarrhea    Severe diarrhea    Amphetamines Other (See Comments)    Unknown reaction type    Outpatient Encounter Medications as of 08/15/2019  Medication Sig   acetaminophen (TYLENOL 8 HOUR) 650 MG CR tablet Take 650 mg by mouth every 8 (eight) hours as needed for pain.    allopurinol (ZYLOPRIM) 100 MG tablet TAKE 1 TABLET(100 MG) BY MOUTH DAILY   aspirin EC 81 MG EC tablet Take 1 tablet (81 mg total) by mouth daily.   atorvastatin (LIPITOR) 80 MG tablet Take 1 tablet (80 mg total) by mouth daily.   B-D UF III MINI PEN NEEDLES 31G X 5 MM MISC USE FOUR TIMES DAILY   BIKTARVY 50-200-25 MG TABS tablet TAKE 1 TABLET BY MOUTH DAILY   Blood Glucose Monitoring Suppl (ONE TOUCH ULTRA MINI) w/Device KIT 1 each by Does not apply route 2 (two) times daily. Dx: E11.40   calcitRIOL (ROCALTROL) 0.25 MCG capsule Take 1 capsule by mouth daily.   carvedilol (COREG) 25 MG tablet Take 1 tablet (25 mg total) by mouth 2 (two) times daily with a meal.   Choline Fenofibrate (FENOFIBRIC ACID) 135 MG CPDR TAKE 1  CAPSULE BY MOUTH DAILY   diclofenac sodium (VOLTAREN) 1 % GEL Apply 1 application topically 4 (four) times daily as needed for pain.   gabapentin (NEURONTIN) 300 MG capsule Take 1 capsule (300 mg total) by mouth at bedtime.   glucose blood test strip Use to test blood sugar three times daily. E11.40   Insulin Glargine (TOUJEO MAX SOLOSTAR [place]) Inject 80 Units into the skin every morning. Do not give if blood sugar is less than 100   insulin lispro (HUMALOG) 100 UNIT/ML injection Inject 20 Units into the skin 3 (three) times daily before meals. If blood sugars are above 150   isosorbide mononitrate (IMDUR) 30 MG 24 hr tablet TAKE 1 TABLET(30 MG) BY MOUTH DAILY   Lancets (ONETOUCH DELICA PLUS [id number]) MISC 1 each by Does not apply route 3 (three) times daily. Use to test blood sugar three times daily. Dx: E11.40   latanoprost (XALATAN) 0.005 % ophthalmic solution Place 1 drop into both eyes at bedtime. Reported on 04/04/2016   nitroGLYCERIN (NITROSTAT) 0.3 MG SL tablet ONE TABLET UNDER TONGUE AS NEEDED FOR CHEST PAIN   pantoprazole (PROTONIX) 40 MG tablet Take 1 tablet (40 mg total) by mouth daily.   sevelamer carbonate (RENVELA) 800 MG tablet Take 1 tablet (800 mg  total) by mouth 3 (three) times daily with meals.   simethicone (MYLICON) 80 MG chewable tablet Chew 1 tablet (80 mg total) by mouth 4 (four) times daily as needed for flatulence.   timolol (TIMOPTIC) 0.5 % ophthalmic solution Place 1 drop into both eyes every morning.   umeclidinium-vilanterol (ANORO ELLIPTA) 62.5-25 MCG/INH AEPB INHALE 1 PUFF BY MOUTH EVERY DAY   UNABLE TO FIND Hgb A1C to be drawn 09/09/2019 during dialysis on [address],[place] [address]  Telephone # [phone]   No facility-administered encounter medications on file as of 08/15/2019.     Review of Systems:  Review of Systems  Constitutional: Negative for appetite change, chills, fatigue and fever.  Respiratory: Negative for cough, chest tightness, shortness of breath and wheezing.   Gastrointestinal: Negative for abdominal distention, abdominal pain, constipation, diarrhea, nausea and vomiting.  Endocrine: Negative for polydipsia, polyphagia and polyuria.  Genitourinary:       On dialysis three times per week   Skin: Negative for color change, pallor and rash.  Neurological: Negative for dizziness, light-headedness and headaches.    Health Maintenance  Topic Date Due   OPHTHALMOLOGY EXAM  07/10/2019   HEMOGLOBIN A1C  09/04/2019   FOOT EXAM  12/06/2019   LIPID PANEL  06/03/2020   COLONOSCOPY  10/01/2022   TETANUS/TDAP  06/13/2029   INFLUENZA VACCINE  Completed   Hepatitis C Screening  Completed   PNA vac Low Risk Adult  Completed    Physical Exam: There were no vitals filed for this visit. There is no height or weight on file to calculate BMI. Physical Exam   Unable to complete on telephone visit.  Labs reviewed: Basic Metabolic Panel: Recent Labs    07/21/19 0928  07/30/19 1219 07/31/19 0318 08/01/19 0325  NA 140   < > 132* 131* 132*  K 4.5   < > 3.9 4.1 3.8  CL 108   < > 96* 96* 95*  CO2 23   < > 19* 18* 20*  GLUCOSE 110*   < > 155* 202* 205*  BUN 72*   < > 99* 117* 65*    CREATININE 5.55*   < > 7.39* 7.77* 5.73*  CALCIUM 9.0   < >  8.4* 8.4* 8.5*  MG 1.8  --   --   --   --   PHOS 5.1*   < > 7.5* 8.2* 6.5*   < > = values in this interval not displayed.   Liver Function Tests: Recent Labs    02/07/19 0642 06/24/19 1027 07/21/19 0928  07/23/19 0608  07/30/19 1219 07/31/19 0318 08/01/19 0325  AST 26 30 95*  --  47*  --   --   --   --   ALT 21 25 51*  --  34  --   --   --   --   ALKPHOS 40  --  42  --  43  --   --   --   --   BILITOT 0.9 0.4 0.7  --  1.2  --   --   --   --   PROT 7.6 7.8 7.7  --  7.6  --   --   --   --   ALBUMIN 3.4*  --  3.2*   < > 3.0*   < > 2.2* 2.0* 2.2*   < > = values in this interval not displayed.   Recent Labs    02/06/19 1045  LIPASE 110*   CBC: Recent Labs    02/06/19 1045  07/21/19 0928  07/29/19 0357 07/30/19 1219 07/31/19 0318 08/01/19 0325  WBC 5.8   < > 7.2   < > 6.7 7.0 7.0 7.8  NEUTROABS 2.7  --  5.6  --  4.4  --   --   --   HGB 6.4*   < > 9.7*   < > 8.2* 9.7* 8.8* 9.4*  HCT 20.3*   < > 28.9*   < > 24.4* 29.3* 25.8* 27.3*  MCV 96.2   < > 100.3*   < > 96.1 96.1 93.8 92.2  PLT 199   < > 200   < > 205 280 284 298   < > = values in this interval not displayed.   Lipid Panel: Recent Labs    11/29/18 0933 03/08/19 06/04/19 1019  CHOL 183 115   115 158  HDL 33* 18*   18* 19*  LDLCALC 114*  --   --   TRIG 240* 674*   674* 574*  CHOLHDL 5.5*  --  8.3*   Lab Results  Component Value Date   HGBA1C 9.2 (H) 06/04/2019    Procedures since last visit: Dg Chest 2 View  Result Date: 07/25/2019 CLINICAL DATA:  Chest pain shortness of breath. EXAM: CHEST - 2 VIEW COMPARISON:  07/22/2019 FINDINGS: Heart size is enlarged following median sternotomy and CABG. Aortic atherosclerosis is demonstrated. Placement of right IJ catheter since previous exam. Bilateral interstitial prominence patchy opacities similar to prior study. Signs of remote trauma to right ribs as before. IMPRESSION: Interval placement of right IJ  dialysis catheter. No signs of pneumothorax. Signs of pulmonary edema with patchy opacities, superimposed infection not excluded. Electronically Signed   By: Zetta Bills M.D.   On: 07/25/2019 15:45   Dg Chest 2 View  Result Date: 07/22/2019 CLINICAL DATA:  Respiratory distress.  Cough. EXAM: CHEST - 2 VIEW COMPARISON:  July 21, 2019 FINDINGS: Stable cardiomegaly. The hila and mediastinum are normal. No pneumothorax. Diffuse bilateral pulmonary opacities are identified, worrisome for edema. More focal opacity in the medial right lung base is stable to mildly more prominent which could represent consolidative edema versus a superimposed infiltrate. IMPRESSION: Pulmonary edema. More focal  opacity in the medial right lung base is stable to slightly more prominent. This could be part of the pulmonary edema or a superimposed infection. Electronically Signed   By: Dorise Bullion III M.D   On: 07/22/2019 15:07   Dg Abd 1 View  Result Date: 07/23/2019 CLINICAL DATA:  Persistent abdominal pain EXAM: ABDOMEN - 1 VIEW COMPARISON:  CT 11/03/2015 FINDINGS: Vascular calcifications in the pelvis. Prior cholecystectomy. No bowel obstruction, free air, or organomegaly. IMPRESSION: No acute findings. Electronically Signed   By: Rolm Baptise M.D.   On: 07/23/2019 11:17   US Abdomen Complete  Result Date: 07/23/2019 CLINICAL DATA:  Abdominal distension. EXAM: ABDOMEN ULTRASOUND COMPLETE COMPARISON:  CT scan of November 03, 2015. FINDINGS: Gallbladder: Status post cholecystectomy. Common bile duct: Diameter: Not visualized, but no definite dilatation is noted. Liver: No focal lesion identified. Increased echogenicity of hepatic parenchyma is noted. Portal vein is patent on color Doppler imaging with normal direction of blood flow towards the liver. IVC: No abnormality visualized. Pancreas: Not visualized due to overlying bowel gas. Spleen: Size and appearance within normal limits. Right Kidney: Length: 12.1 cm. 1 cm  cyst is noted in lower pole. Increased echogenicity of renal parenchyma is noted. No mass or hydronephrosis visualized. Left Kidney: Length: 12.9 cm. 8 mm cyst is noted in midpole. Increased echogenicity of renal parenchyma is noted. No mass or hydronephrosis visualized. Abdominal aorta: No aneurysm visualized. Other findings: None. IMPRESSION: Increased echogenicity of renal parenchyma is noted suggesting hepatic steatosis. Increased echogenicity of renal parenchyma is noted bilaterally suggesting medical renal disease. No hydronephrosis or renal obstruction is noted. Small bilateral simple renal cysts are noted. Pancreas not visualized due to overlying bowel gas. Status post cholecystectomy. Electronically Signed   By: Marijo Conception M.D.   On: 07/23/2019 15:24   Ir Fluoro Guide Cv Line Right  Result Date: 07/30/2019 INDICATION: 68 year old male with a history of renal failure EXAM: IMAGE GUIDED PLACEMENT OF TUNNELED HEMODIALYSIS CATHETER MEDICATIONS: 2 g Ancef; The antibiotic was administered within an appropriate time interval prior to skin puncture. ANESTHESIA/SEDATION: Moderate (conscious) sedation was employed during this procedure. A total of Versed 1.0 mg and Fentanyl 50 mcg was administered intravenously. Moderate Sedation Time: 14 minutes. The patient's level of consciousness and vital signs were monitored continuously by radiology nursing throughout the procedure under my direct supervision. FLUOROSCOPY TIME:  Fluoroscopy Time: 1 minutes 0 seconds (7 mGy). COMPLICATIONS: None PROCEDURE: Informed written consent was obtained from the patient after a discussion of the risks, benefits, and alternatives to treatment. Questions regarding the procedure were encouraged and answered. The right neck and chest were prepped with chlorhexidine in a sterile fashion, and a sterile drape was applied covering the operative field. Maximum barrier sterile technique with sterile gowns and gloves were used for the  procedure. A timeout was performed prior to the initiation of the procedure. After creating a small venotomy incision, a micropuncture kit was utilized to access the right internal jugular vein under direct, real-time ultrasound guidance after the overlying soft tissues were anesthetized with 1% lidocaine with epinephrine. Ultrasound image documentation was performed. The microwire was marked to measure appropriate internal catheter length. External tunneled length was estimated. A total tip to cuff length of 19 cm was selected. Skin and subcutaneous tissues of chest wall below the clavicle were generously infiltrated with 1% lidocaine for local anesthesia. A small stab incision was made with 11 blade scalpel. The selected hemodialysis catheter was tunneled in a retrograde fashion from  the anterior chest wall to the venotomy incision. A guidewire was advanced to the level of the IVC and the micropuncture sheath was exchanged for a peel-away sheath. The catheter was then placed through the peel-away sheath with tips ultimately positioned within the superior aspect of the right atrium. Final catheter positioning was confirmed and documented with a spot radiographic image. The catheter aspirates and flushes normally. The catheter was flushed with appropriate volume heparin dwells. The catheter exit site was secured with a 0-Prolene retention suture. The venotomy incision was closed Derma bond and sterile dressing. Dressings were applied at the chest wall. Patient tolerated the procedure well and remained hemodynamically stable throughout. No complications were encountered and no significant blood loss encountered. IMPRESSION: Status post right IJ tunneled hemodialysis catheter placement. Catheter ready for use. Signed, Dulcy Fanny. Dellia Nims, RPVI [facility] Radiology Electronically Signed   By: Corrie Mckusick D.O.   On: 07/30/2019 12:31   Ir Fluoro Guide Cv Line  Right  Result Date: 07/24/2019 INDICATION: End-stage renal disease requiring dialysis. EXAM: FLUOROSCOPIC AND ULTRASOUND GUIDED PLACEMENT OF A NON-TUNNELED DIALYSIS CATHETER MEDICATIONS: None ANESTHESIA/SEDATION: Local anesthetic only FLUOROSCOPY TIME:  Fluoroscopy Time: 0 minutes 18 seconds (2 mGy). COMPLICATIONS: None immediate. PROCEDURE: Informed consent was obtained for catheter placement. The patient was placed supine on the interventional table. Ultrasound confirmed a patent right internal jugular vein. Ultrasound images were obtained for documentation. The right neck was prepped and draped in a sterile fashion. The right neck was anesthetized with 1% lidocaine. Maximal barrier sterile technique was utilized including caps, mask, sterile gowns, sterile gloves, sterile drape, hand hygiene and skin antiseptic. A small incision was made with #11 blade scalpel. A 21 gauge needle directed into the right internal jugular vein with ultrasound guidance. A micropuncture dilator set was placed. A 16 cm Mahurkar catheter was selected. The catheter was advanced over a wire and positioned at the superior cavoatrial junction. Fluoroscopic images were obtained for documentation. Both dialysis lumens were found to aspirate and flush well. The proper amount of heparin was flushed in both lumens. The central venous lumen was flushed with normal saline. Catheter was sutured to skin. FINDINGS: Catheter tip at the superior cavoatrial junction. IMPRESSION: Successful placement of a 16 cm non-tunneled dialysis catheter using ultrasound and fluoroscopic guidance. The catheter is ready for immediate use as clinically indicated. Electronically Signed   By: Constance Holster M.D.   On: 07/24/2019 13:41   Ir US Guide Vasc Access Right  Result Date: 07/30/2019 INDICATION: 68 year old male with a history of renal failure EXAM: IMAGE GUIDED PLACEMENT OF TUNNELED HEMODIALYSIS CATHETER MEDICATIONS: 2 g Ancef; The antibiotic was  administered within an appropriate time interval prior to skin puncture. ANESTHESIA/SEDATION: Moderate (conscious) sedation was employed during this procedure. A total of Versed 1.0 mg and Fentanyl 50 mcg was administered intravenously. Moderate Sedation Time: 14 minutes. The patient's level of consciousness and vital signs were monitored continuously by radiology nursing throughout the procedure under my direct supervision. FLUOROSCOPY TIME:  Fluoroscopy Time: 1 minutes 0 seconds (7 mGy). COMPLICATIONS: None PROCEDURE: Informed written consent was obtained from the patient after a discussion of the risks, benefits, and alternatives to treatment. Questions regarding the procedure were encouraged and answered. The right neck and chest were prepped with chlorhexidine in a sterile fashion, and a sterile drape was applied covering the operative field. Maximum barrier sterile technique with sterile gowns and gloves were used for the procedure. A timeout was performed prior to  the initiation of the procedure. After creating a small venotomy incision, a micropuncture kit was utilized to access the right internal jugular vein under direct, real-time ultrasound guidance after the overlying soft tissues were anesthetized with 1% lidocaine with epinephrine. Ultrasound image documentation was performed. The microwire was marked to measure appropriate internal catheter length. External tunneled length was estimated. A total tip to cuff length of 19 cm was selected. Skin and subcutaneous tissues of chest wall below the clavicle were generously infiltrated with 1% lidocaine for local anesthesia. A small stab incision was made with 11 blade scalpel. The selected hemodialysis catheter was tunneled in a retrograde fashion from the anterior chest wall to the venotomy incision. A guidewire was advanced to the level of the IVC and the micropuncture sheath was exchanged for a peel-away sheath. The catheter was then placed through the  peel-away sheath with tips ultimately positioned within the superior aspect of the right atrium. Final catheter positioning was confirmed and documented with a spot radiographic image. The catheter aspirates and flushes normally. The catheter was flushed with appropriate volume heparin dwells. The catheter exit site was secured with a 0-Prolene retention suture. The venotomy incision was closed Derma bond and sterile dressing. Dressings were applied at the chest wall. Patient tolerated the procedure well and remained hemodynamically stable throughout. No complications were encountered and no significant blood loss encountered. IMPRESSION: Status post right IJ tunneled hemodialysis catheter placement. Catheter ready for use. Signed, Dulcy Fanny. Dellia Nims, RPVI [facility] Inter[facility] Radiology [facility] Radiology Electronically Signed   By: Corrie Mckusick D.O.   On: 07/30/2019 12:31   Ir US Guide Vasc Access Right  Result Date: 07/24/2019 INDICATION: End-stage renal disease requiring dialysis. EXAM: FLUOROSCOPIC AND ULTRASOUND GUIDED PLACEMENT OF A NON-TUNNELED DIALYSIS CATHETER MEDICATIONS: None ANESTHESIA/SEDATION: Local anesthetic only FLUOROSCOPY TIME:  Fluoroscopy Time: 0 minutes 18 seconds (2 mGy). COMPLICATIONS: None immediate. PROCEDURE: Informed consent was obtained for catheter placement. The patient was placed supine on the interventional table. Ultrasound confirmed a patent right internal jugular vein. Ultrasound images were obtained for documentation. The right neck was prepped and draped in a sterile fashion. The right neck was anesthetized with 1% lidocaine. Maximal barrier sterile technique was utilized including caps, mask, sterile gowns, sterile gloves, sterile drape, hand hygiene and skin antiseptic. A small incision was made with #11 blade scalpel. A 21 gauge needle directed into the right internal jugular vein with ultrasound guidance. A micropuncture dilator set was placed. A  16 cm Mahurkar catheter was selected. The catheter was advanced over a wire and positioned at the superior cavoatrial junction. Fluoroscopic images were obtained for documentation. Both dialysis lumens were found to aspirate and flush well. The proper amount of heparin was flushed in both lumens. The central venous lumen was flushed with normal saline. Catheter was sutured to skin. FINDINGS: Catheter tip at the superior cavoatrial junction. IMPRESSION: Successful placement of a 16 cm non-tunneled dialysis catheter using ultrasound and fluoroscopic guidance. The catheter is ready for immediate use as clinically indicated. Electronically Signed   By: Constance Holster M.D.   On: 07/24/2019 13:41   Dg Chest Port 1 View  Result Date: 07/21/2019 CLINICAL DATA:  Pt arrives via [facility] from home with c/o sob that began a few hours pta, pt also reports new onset of cough this am after he smoked. Ems states O2 sats were in the low 80s upon their arrival, O2 sats improved to 99% on NRB, lung s.*comment was truncated*sob EXAM: PORTABLE CHEST  1 VIEW COMPARISON:  Radiograph 02/06/2019 FINDINGS: Sternotomy wires overlie normal cardiac silhouette. Fine interstitial pattern throughout the lungs suggests interstitial edema. No clear pleural fluid. No focal consolidation. Remote RIGHT rib fractures. IMPRESSION: Interstitial edema pattern. Electronically Signed   By: Suzy Bouchard M.D.   On: 07/21/2019 09:29   Vas US Duplex Dialysis Access (avf, Avg)  Result Date: 07/27/2019 DIALYSIS ACCESS Reason for Exam: Mechanical complication AVF. Access Site: Left Upper Extremity. Access Type: Radial-cephalic AVF. Limitations: Poor patient cooperation- patient having stomach pains. Comparison Study: 12/18/2018- dialysis access duplex Performing Technologist: Maudry Mayhew MHA, RDMS, RVT, RDCS  Examination Guidelines: A complete evaluation includes B-mode imaging, spectral Doppler, color Doppler, and power Doppler as needed of all  accessible portions of each vessel. Unilateral testing is considered an integral part of a complete examination. Limited examinations for reoccurring indications may be performed as noted.  Findings: +--------------------+----------+-----------------+--------+  AVF                  PSV (cm/s) Flow Vol (mL/min) Comments  +--------------------+----------+-----------------+--------+  Native artery inflow    207            377                  +--------------------+----------+-----------------+--------+  AVF Anastomosis          53                                 +--------------------+----------+-----------------+--------+  +------------+----------+-------------+----------+--------+  OUTFLOW VEIN PSV (cm/s) Diameter (cm) Depth (cm) Describe  +------------+----------+-------------+----------+--------+  Mid Forearm     210                                        +------------+----------+-------------+----------+--------+  Dist Forearm    199                                        +------------+----------+-------------+----------+--------+   Summary: Flow volume is decreased when compared to prior study. No obvious evidence of competing branches in the mid to distal forearm.  *See table(s) above for measurements and observations.  Diagnosing physician: Monica Martinez MD Electronically signed by Monica Martinez MD on 07/27/2019 at 5:47:42 PM.   --------------------------------------------------------------------------------   Final    [place] Soft Tissue Non Vascular  Result Date: 08/01/2019 CLINICAL DATA:  Left hand pain. EXAM: ULTRASOUND LEFT UPPER EXTREMITY LIMITED TECHNIQUE: Ultrasound examination of the upper extremity soft tissues was performed in the area of clinical concern. COMPARISON:  None. FINDINGS: There is a 6 mm complex cystic area which appears to contain blood flow in the area of the patient's pain on the dorsum of the hand. I doubt that this is clinically significant. No ganglion  cysts or solid mass lesions. IMPRESSION: No definitive abnormality in the area of pain. Electronically Signed   By: Lorriane Shire M.D.   On: 08/01/2019 07:52    Assessment/Plan   Type 2 diabetes mellitus with diabetic neuropathy, with long-term current use of insulin (HCC) CBG readings 110's-130's.Has stopped using Tujeo due to low blood sugars.Encouraged to continue with Humalog 20 units SQ three times before meals if CBG > 150.Patient will talk with his pharmacist concerning unused Tujeo injection.Continue on ASA ,BB,  and Statin for cardiovascular event prevention. Continue on Gabapentin 300 mg capsule three times daily  - up to date on annual foot exam  But due for eye exam.He has upcoming appointment with Ophthalmology in [place].    Labs/tests ordered: None  Next appt:  09/09/2019 with Sherrie Mustache ,NP for medical management of chronic issues.   Spent 12 minutes of non-face to face with patient

## 2019-08-15 NOTE — Telephone Encounter (Signed)
Patient called stating the providers at Dialysis are concerned about low blood sugar readings:  BS readings: 110, 111, 120, and 125   Patient stopped toujeo 2 weeks ago yet he needs the ok from [name], NP to permanently discontinue medication

## 2019-08-15 NOTE — Telephone Encounter (Signed)
Spoke with patient, patient scheduled telephone visit for today to speak with Dinah. Patient unable to do visits on M/W/F due to dialysis.

## 2019-08-15 NOTE — Telephone Encounter (Signed)
He is having these numbers without taking any diabetic medications? Sounds like he needs a follow up

## 2019-08-16 ENCOUNTER — Other Ambulatory Visit: Payer: Self-pay

## 2019-08-16 MED ORDER — SIMETHICONE 80 MG PO CHEW: 80.0000 mg | CHEWABLE_TABLET | Freq: Four times a day (QID) | ORAL | 3 refills | Status: DC | PRN

## 2019-08-16 NOTE — Telephone Encounter (Signed)
Okay to fill? 

## 2019-08-16 NOTE — Telephone Encounter (Signed)
Incoming call received from patient asking for a refill on Simethicone to be sent to [facility] on file. Medication was originally prescribed in the hospital.  Please advise if ok to refill and if so how may additional refills allowed

## 2019-08-21 ENCOUNTER — Ambulatory Visit: Payer: Medicare Other | Admitting: Cardiology

## 2019-08-21 LAB — CBC AND DIFFERENTIAL: Hemoglobin: 10.7 — AB (ref 13.5–17.5)

## 2019-08-28 ENCOUNTER — Ambulatory Visit (INDEPENDENT_AMBULATORY_CARE_PROVIDER_SITE_OTHER): Payer: Medicare Other | Admitting: Podiatry

## 2019-08-28 ENCOUNTER — Ambulatory Visit: Payer: Medicare Other | Admitting: Podiatry

## 2019-08-28 ENCOUNTER — Other Ambulatory Visit: Payer: Self-pay

## 2019-08-28 DIAGNOSIS — L989 Disorder of the skin and subcutaneous tissue, unspecified: Secondary | ICD-10-CM | POA: Diagnosis not present

## 2019-08-28 DIAGNOSIS — B351 Tinea unguium: Secondary | ICD-10-CM

## 2019-08-28 DIAGNOSIS — E0842 Diabetes mellitus due to underlying condition with diabetic polyneuropathy: Secondary | ICD-10-CM

## 2019-08-28 DIAGNOSIS — L97512 Non-pressure chronic ulcer of other part of right foot with fat layer exposed: Secondary | ICD-10-CM

## 2019-08-28 DIAGNOSIS — M79676 Pain in unspecified toe(s): Secondary | ICD-10-CM | POA: Diagnosis not present

## 2019-08-28 LAB — CBC AND DIFFERENTIAL: Hemoglobin: 11.2 — AB (ref 13.5–17.5)

## 2019-09-02 NOTE — Progress Notes (Signed)
Subjective: Patient is a 68 y.o. male presenting to the office today for follow up evaluation of painful callus lesion(s) noted to the bilateral feet. Walking and bearing weight increases the pain. He has not done anything for treatment at home.  Patient also complains of elongated, thickened nails that cause pain while ambulating in shoes. He is unable to trim his own nails. Patient presents today for further treatment and evaluation.  Past Medical History:  Diagnosis Date  . Acute respiratory failure ([place]) 03/01/2018  . Arthritis    "all over; mostly knees and back" (02/28/2018)  . CHF (congestive heart failure) (HCC)    not on any meds  . Chronic lower back pain    stenosis  . CKD (chronic kidney disease), stage V ([place])    [name] 02/28/2018  . Community acquired pneumonia 09/06/2013  . Coronary atherosclerosis of native coronary artery 2005   s/p surgery  . Drug abuse ([place])    hx; tested for cocaine as recently as 2/08. says he is not using drugs now - avoided defib. for this reason   . GERD (gastroesophageal reflux disease)    takes OTC meds as needed  . GI bleeding 02/06/2019  . Glaucoma    uses eye drops daily  . Hepatitis B 1968   "tx'd w/isolation; caught it from toilet stools in gym"  . History of blood transfusion 03/01/2019  . History of colon polyps    benign  . History of gout    takes Allopurinol daily as well as Colchicine-if needed (02/28/2018)  . History of kidney stones   . HTN (hypertension)    takes Coreg,Imdur.and Apresoline daily  . Human immunodeficiency virus (HIV) disease ([place]) dx'd 1995   takes Genvoya daily  . Hyperlipidemia    takes Atorvastatin daily  . Ischemic cardiomyopathy   . Muscle spasm    takes Zanaflex as needed  . Myocardial infarction ([place]) ~ 2004/2005  . Nocturia   . Peripheral neuropathy    takes gabapentin daily  . Pneumonia    "at least twice" (02/28/2018)  . Shortness of breath dyspnea    rarely but if notices it then with  exertion  . Syphilis, unspecified   . Type II diabetes mellitus ([place]) 2004   Lantus daily.Average fasting blood sugar 125-199  . Wears glasses   . Wears partial dentures     Objective:  Physical Exam General: Alert and oriented x3 in no acute distress  Dermatology: Hyperkeratotic lesion(s) present on the bilateral feet. Pain on palpation with a central nucleated core noted. Skin is warm, dry and supple bilateral lower extremities. Negative for open lesions or macerations. Nails are tender, long, thickened and dystrophic with subungual debris, consistent with onychomycosis, 1-5 bilateral. No signs of infection noted.  Wound #1 noted to the right foot measuring approximately 0.5 x 0.7 x 0.1 cm.   To the above-noted ulceration, there is no eschar. There is a moderate amount of slough, fibrin and necrotic tissue. Granulation tissue and wound base is red. There is no malodor. There is a minimal amount of serosanginous drainage noted. Periwound integrity is intact.   Vascular: Palpable pedal pulses bilaterally. No edema or erythema noted. Capillary refill within normal limits.  Neurological: Epicritic and protective threshold diminished bilaterally.   Musculoskeletal Exam: Pain on palpation at the keratotic lesion(s) noted. Range of motion within normal limits bilateral. Muscle strength 5/5 in all groups bilateral.  Assessment: 1. Onychodystrophic nails 1-5 bilateral with hyperkeratosis of nails.  2.  Onychomycosis of nail due to dermatophyte bilateral 3. Pre-ulcerative callus lesions noted to the bilateral feet x 5 4. Ulceration right foot secondary to diabetes mellitus    Plan of Care:  1. Patient evaluated. 2. Excisional debridement of keratoic lesion(s) using a chisel blade was performed without incident.  3. Dressed with light dressing. 4. Mechanical debridement of nails 1-5 bilaterally performed using a nail nipper. Filed with dremel without incident.  5. Medically necessary  excisional debridement including subcutaneous tissue was performed using a tissue nipper and a chisel blade. Excisional debridement of all the necrotic nonviable tissue down to healthy bleeding viable tissue was performed with post-debridement measurements same as pre-. 6. The wound was cleansed and dry sterile dressing applied. 7. Return to clinic in 3 months.     Edrick Kins, DPM [facility]  Dr. Edrick Kins, [address]                                        [place], [place] [address]                Office [phone]  Fax [phone]

## 2019-09-04 ENCOUNTER — Other Ambulatory Visit: Payer: Self-pay

## 2019-09-04 DIAGNOSIS — N184 Chronic kidney disease, stage 4 (severe): Secondary | ICD-10-CM

## 2019-09-04 LAB — CBC AND DIFFERENTIAL
Hemoglobin: 11.2 — AB (ref 13.5–17.5)
Hemoglobin: 9.6 — AB (ref 13.5–17.5)

## 2019-09-09 ENCOUNTER — Other Ambulatory Visit: Payer: Medicare Other

## 2019-09-09 ENCOUNTER — Telehealth (HOSPITAL_COMMUNITY): Payer: Self-pay | Admitting: *Deleted

## 2019-09-09 ENCOUNTER — Encounter: Payer: Self-pay | Admitting: Internal Medicine

## 2019-09-09 NOTE — Telephone Encounter (Signed)
The above patient or their representative was contacted and gave the following answers to these questions:         Do you have any of the following symptoms?    NO  Fever                    Cough                   Shortness of breath  Do  you have any of the following other symptoms? n   muscle pain         vomiting,        diarrhea        rash         weakness        red eye        abdominal pain         bruising          bruising or bleeding              joint pain           severe headache    Have you been in contact with someone who was or has been sick in the past 2 weeks?  NO  Yes                 Unsure                         Unable to assess   Does the person that you were in contact with have any of the following symptoms?   Cough         shortness of breath           muscle pain         vomiting,            diarrhea            rash            weakness           fever            red eye           abdominal pain           bruising  or  bleeding                joint pain                severe headache                 COMMENTS OR ACTION PLAN FOR THIS PATIENT:          

## 2019-09-10 ENCOUNTER — Other Ambulatory Visit: Payer: Self-pay | Admitting: *Deleted

## 2019-09-10 ENCOUNTER — Encounter: Payer: Self-pay | Admitting: *Deleted

## 2019-09-10 ENCOUNTER — Ambulatory Visit (HOSPITAL_COMMUNITY)
Admission: RE | Admit: 2019-09-10 | Discharge: 2019-09-10 | Disposition: A | Discharge status: Home / Self Care | Payer: Medicare Other | Source: Ambulatory Visit | Attending: Vascular Surgery | Admitting: Vascular Surgery

## 2019-09-10 ENCOUNTER — Other Ambulatory Visit: Payer: Self-pay

## 2019-09-10 ENCOUNTER — Encounter: Payer: Self-pay | Admitting: Physician Assistant

## 2019-09-10 ENCOUNTER — Ambulatory Visit (INDEPENDENT_AMBULATORY_CARE_PROVIDER_SITE_OTHER): Payer: Self-pay | Admitting: Physician Assistant

## 2019-09-10 VITALS — BP 146/72 | HR 82 | Temp 97.5°F | Resp 16 | Ht 74.0 in | Wt 190.0 lb

## 2019-09-10 DIAGNOSIS — Z992 Dependence on renal dialysis: Secondary | ICD-10-CM

## 2019-09-10 DIAGNOSIS — N184 Chronic kidney disease, stage 4 (severe): Secondary | ICD-10-CM

## 2019-09-10 DIAGNOSIS — N186 End stage renal disease: Secondary | ICD-10-CM

## 2019-09-10 NOTE — Progress Notes (Signed)
POST OPERATIVE OFFICE NOTE    CC:  F/u for surgery  HPI:  This is a 68 y.o. male who is s/p stage left brachiobasilic fistula secondary to ESRD on 08/01/19 by Dr. Donnetta Hutching.  68 year old male status post left radiocephalic AV fistula last year with sidebranch ligation earlier this year.  This is still fairly small and he states it is difficult to cannulate.  After review of AV fistula duplex yesterday flow volumes remain pretty low.  Discussed with him conversion to upper arm fistula with left arm brachiocephalic AV fistula.  He currently is on HD via catheter placed by IR.    He denise pain, los of sensation and loss of motor.    Allergies  Allergen Reactions  . Augmentin [Amoxicillin-Pot Clavulanate] Diarrhea    Severe diarrhea   . Amphetamines Other (See Comments)    Unknown reaction type    Current Outpatient Medications  Medication Sig Dispense Refill  . acetaminophen (TYLENOL 8 HOUR) 650 MG CR tablet Take 650 mg by mouth every 8 (eight) hours as needed for pain.    [name] allopurinol (ZYLOPRIM) 100 MG tablet TAKE 1 TABLET(100 MG) BY MOUTH DAILY 90 tablet 1  . atorvastatin (LIPITOR) 80 MG tablet Take 1 tablet (80 mg total) by mouth daily. 90 tablet 3  . B-D UF III MINI PEN NEEDLES 31G X 5 MM MISC USE FOUR TIMES DAILY 100 each 11  . BIKTARVY 50-200-25 MG TABS tablet TAKE 1 TABLET BY MOUTH DAILY 30 tablet 5  . Blood Glucose Monitoring Suppl (ONE TOUCH ULTRA MINI) w/Device KIT 1 each by Does not apply route 2 (two) times daily. Dx: E11.40 1 each 0  . calcitRIOL (ROCALTROL) 0.25 MCG capsule Take 1 capsule by mouth daily.    . carvedilol (COREG) 25 MG tablet Take 1 tablet (25 mg total) by mouth 2 (two) times daily with a meal. 180 tablet 3  . Choline Fenofibrate (FENOFIBRIC ACID) 135 MG CPDR TAKE 1 CAPSULE BY MOUTH DAILY 90 capsule 1  . diclofenac sodium (VOLTAREN) 1 % GEL Apply 1 application topically 4 (four) times daily as needed for pain.    [name] gabapentin (NEURONTIN) 300 MG capsule Take 300  mg by mouth 3 (three) times daily.    [name] glucose blood test strip Use to test blood sugar three times daily. E11.40 300 each 3  . Insulin Glargine (TOUJEO MAX SOLOSTAR [place]) Inject 88 Units into the skin every morning. Do not give if blood sugar is less than 100    . insulin lispro (HUMALOG) 100 UNIT/ML injection Inject 20 Units into the skin 3 (three) times daily before meals. If blood sugars are above 150    . isosorbide mononitrate (IMDUR) 30 MG 24 hr tablet TAKE 1 TABLET(30 MG) BY MOUTH DAILY 90 tablet 1  . Lancets (ONETOUCH DELICA PLUS [id number]) MISC 1 each by Does not apply route 3 (three) times daily. Use to test blood sugar three times daily. Dx: E11.40 300 each 3  . latanoprost (XALATAN) 0.005 % ophthalmic solution Place 1 drop into both eyes at bedtime. Reported on 04/04/2016    . nitroGLYCERIN (NITROSTAT) 0.3 MG SL tablet ONE TABLET UNDER TONGUE AS NEEDED FOR CHEST PAIN 25 tablet 4  . pantoprazole (PROTONIX) 40 MG tablet Take 1 tablet (40 mg total) by mouth daily. 90 tablet 1  . simethicone (MYLICON) 80 MG chewable tablet Chew 1 tablet (80 mg total) by mouth 4 (four) times daily as needed for flatulence. 90 tablet 3  .  timolol (TIMOPTIC) 0.5 % ophthalmic solution Place 1 drop into both eyes every morning.    . umeclidinium-vilanterol (ANORO ELLIPTA) 62.5-25 MCG/INH AEPB INHALE 1 PUFF BY MOUTH EVERY DAY 60 each 5  . UNABLE TO FIND Hgb A1C to be drawn 09/09/2019 during dialysis on [address],[place] [address]  Telephone # [phone] 1 Device 0   No current facility-administered medications for this visit.      ROS:  See HPI  Physical Exam:  Findings: +--------------------+----------+-----------------+--------+ AVF                 PSV (cm/s)Flow Vol (mL/min)Comments +--------------------+----------+-----------------+--------+ Native artery inflow   243          1370                +--------------------+----------+-----------------+--------+ AVF Anastomosis         589                              +--------------------+----------+-----------------+--------+    +------------+----------+-------------+----------+---------------------+ OUTFLOW VEINPSV (cm/s)Diameter (cm)Depth (cm)      Describe        +------------+----------+-------------+----------+---------------------+ Prox UA        109        0.79        1.00                         +------------+----------+-------------+----------+---------------------+ Mid UA         134        0.67        0.83        joins deep       +------------+----------+-------------+----------+---------------------+ Dist UA        122        0.74        0.59   competing branch 0.18 +------------+----------+-------------+----------+---------------------+ AC Fossa    384 / 662     0.65        0.43                         +------------+----------+-------------+----------+---------------------+   Incision:  Well healed left AC incision Extremities:  Grip 5/5 and sensation intact left UE   Assessment/Plan:  This is a 68 y.o. male who is s/p: First stage basilic fistula creation L UE  The fistula is maturing nicely.  We will schedule him for second stage basilic vein transposition.     Roxy Horseman, PA-C [facility] [phone]  Clinic MD:  Carlis Abbott

## 2019-09-10 NOTE — H&P (View-Only) (Signed)
POST OPERATIVE OFFICE NOTE    CC:  F/u for surgery  HPI:  This is a 68 y.o. male who is s/p stage left brachiobasilic fistula secondary to ESRD on 08/01/19 by Dr. Donnetta Hutching.  68 year old male status post left radiocephalic AV fistula last year with sidebranch ligation earlier this year.  This is still fairly small and he states it is difficult to cannulate.  After review of AV fistula duplex yesterday flow volumes remain pretty low.  Discussed with him conversion to upper arm fistula with left arm brachiocephalic AV fistula.  He currently is on HD via catheter placed by IR.    He denise pain, los of sensation and loss of motor.    Allergies  Allergen Reactions  . Augmentin [Amoxicillin-Pot Clavulanate] Diarrhea    Severe diarrhea   . Amphetamines Other (See Comments)    Unknown reaction type    Current Outpatient Medications  Medication Sig Dispense Refill  . acetaminophen (TYLENOL 8 HOUR) 650 MG CR tablet Take 650 mg by mouth every 8 (eight) hours as needed for pain.    [name] allopurinol (ZYLOPRIM) 100 MG tablet TAKE 1 TABLET(100 MG) BY MOUTH DAILY 90 tablet 1  . atorvastatin (LIPITOR) 80 MG tablet Take 1 tablet (80 mg total) by mouth daily. 90 tablet 3  . B-D UF III MINI PEN NEEDLES 31G X 5 MM MISC USE FOUR TIMES DAILY 100 each 11  . BIKTARVY 50-200-25 MG TABS tablet TAKE 1 TABLET BY MOUTH DAILY 30 tablet 5  . Blood Glucose Monitoring Suppl (ONE TOUCH ULTRA MINI) w/Device KIT 1 each by Does not apply route 2 (two) times daily. Dx: E11.40 1 each 0  . calcitRIOL (ROCALTROL) 0.25 MCG capsule Take 1 capsule by mouth daily.    . carvedilol (COREG) 25 MG tablet Take 1 tablet (25 mg total) by mouth 2 (two) times daily with a meal. 180 tablet 3  . Choline Fenofibrate (FENOFIBRIC ACID) 135 MG CPDR TAKE 1 CAPSULE BY MOUTH DAILY 90 capsule 1  . diclofenac sodium (VOLTAREN) 1 % GEL Apply 1 application topically 4 (four) times daily as needed for pain.    [name] gabapentin (NEURONTIN) 300 MG capsule Take 300  mg by mouth 3 (three) times daily.    [name] glucose blood test strip Use to test blood sugar three times daily. E11.40 300 each 3  . Insulin Glargine (TOUJEO MAX SOLOSTAR [place]) Inject 88 Units into the skin every morning. Do not give if blood sugar is less than 100    . insulin lispro (HUMALOG) 100 UNIT/ML injection Inject 20 Units into the skin 3 (three) times daily before meals. If blood sugars are above 150    . isosorbide mononitrate (IMDUR) 30 MG 24 hr tablet TAKE 1 TABLET(30 MG) BY MOUTH DAILY 90 tablet 1  . Lancets (ONETOUCH DELICA PLUS [id number]) MISC 1 each by Does not apply route 3 (three) times daily. Use to test blood sugar three times daily. Dx: E11.40 300 each 3  . latanoprost (XALATAN) 0.005 % ophthalmic solution Place 1 drop into both eyes at bedtime. Reported on 04/04/2016    . nitroGLYCERIN (NITROSTAT) 0.3 MG SL tablet ONE TABLET UNDER TONGUE AS NEEDED FOR CHEST PAIN 25 tablet 4  . pantoprazole (PROTONIX) 40 MG tablet Take 1 tablet (40 mg total) by mouth daily. 90 tablet 1  . simethicone (MYLICON) 80 MG chewable tablet Chew 1 tablet (80 mg total) by mouth 4 (four) times daily as needed for flatulence. 90 tablet 3  .  timolol (TIMOPTIC) 0.5 % ophthalmic solution Place 1 drop into both eyes every morning.    . umeclidinium-vilanterol (ANORO ELLIPTA) 62.5-25 MCG/INH AEPB INHALE 1 PUFF BY MOUTH EVERY DAY 60 each 5  . UNABLE TO FIND Hgb A1C to be drawn 09/09/2019 during dialysis on [address],[place] [address]  Telephone # [phone] 1 Device 0   No current facility-administered medications for this visit.      ROS:  See HPI  Physical Exam:  Findings: +--------------------+----------+-----------------+--------+ AVF                 PSV (cm/s)Flow Vol (mL/min)Comments +--------------------+----------+-----------------+--------+ Native artery inflow   243          1370                +--------------------+----------+-----------------+--------+ AVF Anastomosis         589                              +--------------------+----------+-----------------+--------+    +------------+----------+-------------+----------+---------------------+ OUTFLOW VEINPSV (cm/s)Diameter (cm)Depth (cm)      Describe        +------------+----------+-------------+----------+---------------------+ Prox UA        109        0.79        1.00                         +------------+----------+-------------+----------+---------------------+ Mid UA         134        0.67        0.83        joins deep       +------------+----------+-------------+----------+---------------------+ Dist UA        122        0.74        0.59   competing branch 0.18 +------------+----------+-------------+----------+---------------------+ AC Fossa    384 / 662     0.65        0.43                         +------------+----------+-------------+----------+---------------------+   Incision:  Well healed left AC incision Extremities:  Grip 5/5 and sensation intact left UE   Assessment/Plan:  This is a 68 y.o. male who is s/p: First stage basilic fistula creation L UE  The fistula is maturing nicely.  We will schedule him for second stage basilic vein transposition.     Roxy Horseman, PA-C [facility] [phone]  Clinic MD:  Carlis Abbott

## 2019-09-11 ENCOUNTER — Telehealth: Payer: Self-pay

## 2019-09-11 NOTE — Telephone Encounter (Signed)
Called patient left message for him to call office at his convince. Patient called the office back and I  asked him if he would like to come in for his  blood work before his appointment on Friday with  [name] he said he knew nothing about an appointment with [name] and said he was not coming in for any lab work or any appointment and that I shouldn't call him one day ahead of time to ask him to come in. He said he has to go to dialysis on M-W-F and he knew nothing about an appointment on Friday and would not be coming to it I asked him if he would like to schedule a new appointment at his convince and he said to call him on Tuesday . Patient was very rude during the whole conversation

## 2019-09-12 ENCOUNTER — Ambulatory Visit: Payer: Medicare Other | Admitting: Nurse Practitioner

## 2019-09-13 ENCOUNTER — Ambulatory Visit: Payer: Self-pay | Admitting: Nurse Practitioner

## 2019-09-18 ENCOUNTER — Other Ambulatory Visit: Payer: Self-pay | Admitting: *Deleted

## 2019-09-18 MED ORDER — ONETOUCH DELICA PLUS LANCET33G MISC: 1.0000 | Freq: Three times a day (TID) | 3 refills | Status: DC

## 2019-09-21 ENCOUNTER — Other Ambulatory Visit: Payer: Self-pay | Admitting: Nurse Practitioner

## 2019-09-21 DIAGNOSIS — E1169 Type 2 diabetes mellitus with other specified complication: Secondary | ICD-10-CM

## 2019-09-25 ENCOUNTER — Other Ambulatory Visit: Payer: Self-pay | Admitting: *Deleted

## 2019-09-25 LAB — CBC AND DIFFERENTIAL: Hemoglobin: 9.5 — AB (ref 13.5–17.5)

## 2019-10-01 ENCOUNTER — Encounter (HOSPITAL_COMMUNITY): Payer: Self-pay | Admitting: *Deleted

## 2019-10-01 ENCOUNTER — Other Ambulatory Visit (HOSPITAL_COMMUNITY)
Admission: RE | Admit: 2019-10-01 | Discharge: 2019-10-01 | Disposition: A | Discharge status: Home / Self Care | Payer: Medicare Other | Source: Ambulatory Visit | Attending: Vascular Surgery | Admitting: Vascular Surgery

## 2019-10-01 ENCOUNTER — Other Ambulatory Visit: Payer: Self-pay

## 2019-10-01 DIAGNOSIS — Z01812 Encounter for preprocedural laboratory examination: Secondary | ICD-10-CM | POA: Diagnosis present

## 2019-10-01 DIAGNOSIS — Z20828 Contact with and (suspected) exposure to other viral communicable diseases: Secondary | ICD-10-CM | POA: Insufficient documentation

## 2019-10-01 LAB — SARS CORONAVIRUS 2 (TAT 6-24 HRS): SARS Coronavirus 2: NEGATIVE

## 2019-10-01 NOTE — Progress Notes (Signed)
   10/01/19 1617  OBSTRUCTIVE SLEEP APNEA  Have you ever been diagnosed with sleep apnea through a sleep study? No  Do you snore loudly (loud enough to be heard through closed doors)?  1  Do you often feel tired, fatigued, or sleepy during the daytime (such as falling asleep during driving or talking to someone)? 0  Has anyone observed you stop breathing during your sleep? 0  Do you have, or are you being treated for high blood pressure? 1  BMI more than 35 kg/m2? 0  Age > 50 (1-yes) 1  Neck circumference greater than:Male 16 inches or larger, Male 17inches or larger? 1 (42.5)  Male Gender (Yes=1) 1  Obstructive Sleep Apnea Score 5  Score 5 or greater  No PCP;Results sent to PCP

## 2019-10-01 NOTE — Progress Notes (Signed)
Mr. Wamble denies chest pain or shortness of breath.  Patient had Covid test 09/30/2019 and has been in quarantine since.  Mr Dietze will wear a mask in dialysis tomorrow. Mr Gerst reports that CBG run in the 140- 150. I instructed patient to take 1/2 of SS Insulin dose in am if CBG > 220. I instructed patient to check CBG after awaking and every 2 hours until arrival  to the hospital.  I Instructed patient if CBG is less than 70 to take 4 Glucose Tablets .[name] Recheck CBG in 15 minutes then call pre- op desk at [phone] for further instructions.

## 2019-10-02 LAB — CBC AND DIFFERENTIAL: Hemoglobin: 8.8 — AB (ref 13.5–17.5)

## 2019-10-03 ENCOUNTER — Ambulatory Visit (HOSPITAL_COMMUNITY): Payer: Medicare Other | Admitting: Registered Nurse

## 2019-10-03 ENCOUNTER — Encounter (HOSPITAL_COMMUNITY)
Admission: RE | Disposition: A | Discharge status: Home / Self Care | Payer: Self-pay | Source: Home / Self Care | Attending: Vascular Surgery

## 2019-10-03 ENCOUNTER — Ambulatory Visit (HOSPITAL_COMMUNITY)
Admission: RE | Admit: 2019-10-03 | Discharge: 2019-10-03 | Disposition: A | Discharge status: Home / Self Care | Payer: Medicare Other | Attending: Vascular Surgery | Admitting: Vascular Surgery

## 2019-10-03 ENCOUNTER — Encounter (HOSPITAL_COMMUNITY): Payer: Self-pay | Admitting: Vascular Surgery

## 2019-10-03 ENCOUNTER — Other Ambulatory Visit: Payer: Self-pay

## 2019-10-03 DIAGNOSIS — N186 End stage renal disease: Secondary | ICD-10-CM | POA: Diagnosis present

## 2019-10-03 DIAGNOSIS — Z880 Allergy status to penicillin: Secondary | ICD-10-CM | POA: Diagnosis not present

## 2019-10-03 DIAGNOSIS — Z992 Dependence on renal dialysis: Secondary | ICD-10-CM | POA: Diagnosis not present

## 2019-10-03 DIAGNOSIS — N185 Chronic kidney disease, stage 5: Secondary | ICD-10-CM | POA: Diagnosis not present

## 2019-10-03 DIAGNOSIS — Z79899 Other long term (current) drug therapy: Secondary | ICD-10-CM | POA: Insufficient documentation

## 2019-10-03 DIAGNOSIS — Z794 Long term (current) use of insulin: Secondary | ICD-10-CM | POA: Diagnosis not present

## 2019-10-03 HISTORY — PX: BASCILIC VEIN TRANSPOSITION: SHX5742

## 2019-10-03 HISTORY — DX: Chronic obstructive pulmonary disease, unspecified: J44.9

## 2019-10-03 HISTORY — DX: End stage renal disease: N18.6

## 2019-10-03 LAB — POCT I-STAT, CHEM 8
BUN: 28 mg/dL — ABNORMAL HIGH (ref 8–23)
Calcium, Ion: 1.08 mmol/L — ABNORMAL LOW (ref 1.15–1.40)
Chloride: 94 mmol/L — ABNORMAL LOW (ref 98–111)
Creatinine, Ser: 4.2 mg/dL — ABNORMAL HIGH (ref 0.61–1.24)
Glucose, Bld: 120 mg/dL — ABNORMAL HIGH (ref 70–99)
HCT: 32 % — ABNORMAL LOW (ref 39.0–52.0)
Hemoglobin: 10.9 g/dL — ABNORMAL LOW (ref 13.0–17.0)
Potassium: 3.3 mmol/L — ABNORMAL LOW (ref 3.5–5.1)
Sodium: 136 mmol/L (ref 135–145)
TCO2: 31 mmol/L (ref 22–32)

## 2019-10-03 LAB — GLUCOSE, CAPILLARY
Glucose-Capillary: 120 mg/dL — ABNORMAL HIGH (ref 70–99)
Glucose-Capillary: 99 mg/dL (ref 70–99)

## 2019-10-03 LAB — SURGICAL PCR SCREEN
MRSA, PCR: NEGATIVE
Staphylococcus aureus: NEGATIVE

## 2019-10-03 SURGERY — TRANSPOSITION, VEIN, BASILIC
Anesthesia: General | Site: Arm Upper | Laterality: Left

## 2019-10-03 MED ORDER — ACETAMINOPHEN 500 MG PO TABS
ORAL_TABLET | ORAL | Status: AC
  Filled 2019-10-03: qty 1

## 2019-10-03 MED ORDER — VANCOMYCIN HCL IN DEXTROSE 1-5 GM/200ML-% IV SOLN
1000.0000 mg | INTRAVENOUS | Status: AC
  Administered 2019-10-03: 1000 mg via INTRAVENOUS
  Filled 2019-10-03: qty 200

## 2019-10-03 MED ORDER — PROPOFOL 10 MG/ML IV BOLUS
INTRAVENOUS | Status: DC | PRN
  Administered 2019-10-03: 200 mg via INTRAVENOUS

## 2019-10-03 MED ORDER — SODIUM CHLORIDE 0.9 % IV SOLN
INTRAVENOUS | Status: DC | PRN
  Administered 2019-10-03: 500 mL

## 2019-10-03 MED ORDER — LIDOCAINE 2% (20 MG/ML) 5 ML SYRINGE
INTRAMUSCULAR | Status: DC | PRN
  Administered 2019-10-03: 60 mg via INTRAVENOUS

## 2019-10-03 MED ORDER — ONDANSETRON HCL 4 MG/2ML IJ SOLN
INTRAMUSCULAR | Status: AC
  Filled 2019-10-03: qty 2

## 2019-10-03 MED ORDER — PHENYLEPHRINE 40 MCG/ML (10ML) SYRINGE FOR IV PUSH (FOR BLOOD PRESSURE SUPPORT)
PREFILLED_SYRINGE | INTRAVENOUS | Status: DC | PRN
  Administered 2019-10-03 (×2): 80 ug via INTRAVENOUS
  Administered 2019-10-03 (×2): 40 ug via INTRAVENOUS
  Administered 2019-10-03: 80 ug via INTRAVENOUS
  Administered 2019-10-03: 40 ug via INTRAVENOUS
  Administered 2019-10-03: 80 ug via INTRAVENOUS

## 2019-10-03 MED ORDER — ONDANSETRON HCL 4 MG/2ML IJ SOLN
INTRAMUSCULAR | Status: DC | PRN
  Administered 2019-10-03: 4 mg via INTRAVENOUS

## 2019-10-03 MED ORDER — MUPIROCIN 2 % EX OINT: 1.0000 "application " | TOPICAL_OINTMENT | Freq: Once | CUTANEOUS | Status: AC

## 2019-10-03 MED ORDER — ACETAMINOPHEN 500 MG PO TABS
500.0000 mg | ORAL_TABLET | Freq: Once | ORAL | Status: AC
  Administered 2019-10-03: 500 mg via ORAL

## 2019-10-03 MED ORDER — PROPOFOL 10 MG/ML IV BOLUS
INTRAVENOUS | Status: AC
  Filled 2019-10-03: qty 20

## 2019-10-03 MED ORDER — PHENYLEPHRINE 40 MCG/ML (10ML) SYRINGE FOR IV PUSH (FOR BLOOD PRESSURE SUPPORT)
PREFILLED_SYRINGE | INTRAVENOUS | Status: AC
  Filled 2019-10-03: qty 10

## 2019-10-03 MED ORDER — HYDROCODONE-ACETAMINOPHEN 5-325 MG PO TABS: 1.0000 | ORAL_TABLET | Freq: Four times a day (QID) | ORAL | 0 refills | Status: DC | PRN

## 2019-10-03 MED ORDER — 0.9 % SODIUM CHLORIDE (POUR BTL) OPTIME
TOPICAL | Status: DC | PRN
  Administered 2019-10-03: 11:00:00 1000 mL

## 2019-10-03 MED ORDER — SODIUM CHLORIDE 0.9 % IV SOLN
INTRAVENOUS | Status: DC
  Administered 2019-10-03: 09:00:00 via INTRAVENOUS

## 2019-10-03 MED ORDER — LIDOCAINE HCL (PF) 0.5 % IJ SOLN
INTRAMUSCULAR | Status: AC
  Filled 2019-10-03: qty 50

## 2019-10-03 MED ORDER — EPHEDRINE 5 MG/ML INJ
INTRAVENOUS | Status: AC
  Filled 2019-10-03: qty 10

## 2019-10-03 MED ORDER — LIDOCAINE-EPINEPHRINE 0.5 %-1:200000 IJ SOLN
INTRAMUSCULAR | Status: AC
  Filled 2019-10-03: qty 1

## 2019-10-03 MED ORDER — FENTANYL CITRATE (PF) 100 MCG/2ML IJ SOLN: 25.0000 ug | INTRAMUSCULAR | Status: DC | PRN

## 2019-10-03 MED ORDER — ONDANSETRON HCL 4 MG/2ML IJ SOLN: 4.0000 mg | Freq: Once | INTRAMUSCULAR | Status: DC | PRN

## 2019-10-03 MED ORDER — LIDOCAINE 2% (20 MG/ML) 5 ML SYRINGE
INTRAMUSCULAR | Status: AC
  Filled 2019-10-03: qty 5

## 2019-10-03 MED ORDER — EPHEDRINE SULFATE-NACL 50-0.9 MG/10ML-% IV SOSY
PREFILLED_SYRINGE | INTRAVENOUS | Status: DC | PRN
  Administered 2019-10-03 (×5): 5 mg via INTRAVENOUS

## 2019-10-03 MED ORDER — FENTANYL CITRATE (PF) 250 MCG/5ML IJ SOLN
INTRAMUSCULAR | Status: AC
  Filled 2019-10-03: qty 5

## 2019-10-03 MED ORDER — FENTANYL CITRATE (PF) 100 MCG/2ML IJ SOLN
INTRAMUSCULAR | Status: DC | PRN
  Administered 2019-10-03 (×2): 25 ug via INTRAVENOUS

## 2019-10-03 MED ORDER — MUPIROCIN 2 % EX OINT
TOPICAL_OINTMENT | CUTANEOUS | Status: AC
  Administered 2019-10-03: 1 via TOPICAL
  Filled 2019-10-03: qty 22

## 2019-10-03 MED ORDER — SODIUM CHLORIDE 0.9 % IV SOLN
INTRAVENOUS | Status: AC
  Filled 2019-10-03: qty 1.2

## 2019-10-03 SURGICAL SUPPLY — 48 items
ARMBAND PINK RESTRICT EXTREMIT (MISCELLANEOUS) ×2 IMPLANT
BNDG GAUZE ELAST 4 BULKY (GAUZE/BANDAGES/DRESSINGS) ×2 IMPLANT
CANISTER SUCT 3000ML PPV (MISCELLANEOUS) ×2 IMPLANT
CANNULA VESSEL 3MM 2 BLNT TIP (CANNULA) ×2 IMPLANT
CLIP LIGATING EXTRA MED SLVR (CLIP) ×2 IMPLANT
CLIP LIGATING EXTRA SM BLUE (MISCELLANEOUS) ×2 IMPLANT
COVER PROBE W GEL 5X96 (DRAPES) ×2 IMPLANT
COVER WAND RF STERILE (DRAPES) ×2 IMPLANT
DECANTER SPIKE VIAL GLASS SM (MISCELLANEOUS) ×2 IMPLANT
DERMABOND ADVANCED (GAUZE/BANDAGES/DRESSINGS) ×1
DERMABOND ADVANCED .7 DNX12 (GAUZE/BANDAGES/DRESSINGS) ×1 IMPLANT
DRSG KUZMA FLUFF (GAUZE/BANDAGES/DRESSINGS) ×4 IMPLANT
ELECT REM PT RETURN 9FT ADLT (ELECTROSURGICAL) ×2
ELECTRODE REM PT RTRN 9FT ADLT (ELECTROSURGICAL) ×1 IMPLANT
GAUZE SPONGE 2X2 8PLY STRL LF (GAUZE/BANDAGES/DRESSINGS) ×1 IMPLANT
GLOVE BIO SURGEON STRL SZ 6.5 (GLOVE) ×4 IMPLANT
GLOVE BIO SURGEON STRL SZ7 (GLOVE) ×2 IMPLANT
GLOVE BIOGEL PI IND STRL 6.5 (GLOVE) ×2 IMPLANT
GLOVE BIOGEL PI IND STRL 7.0 (GLOVE) ×1 IMPLANT
GLOVE BIOGEL PI IND STRL 7.5 (GLOVE) ×1 IMPLANT
GLOVE BIOGEL PI IND STRL 8 (GLOVE) ×1 IMPLANT
GLOVE BIOGEL PI INDICATOR 6.5 (GLOVE) ×2
GLOVE BIOGEL PI INDICATOR 7.0 (GLOVE) ×1
GLOVE BIOGEL PI INDICATOR 7.5 (GLOVE) ×1
GLOVE BIOGEL PI INDICATOR 8 (GLOVE) ×1
GLOVE SS BIOGEL STRL SZ 7.5 (GLOVE) ×1 IMPLANT
GLOVE SUPERSENSE BIOGEL SZ 7.5 (GLOVE) ×1
GLOVE SURG SS PI 6.5 STRL IVOR (GLOVE) ×2 IMPLANT
GOWN STRL NON-REIN LRG LVL3 (GOWN DISPOSABLE) ×2 IMPLANT
GOWN STRL REUS W/ TWL LRG LVL3 (GOWN DISPOSABLE) ×3 IMPLANT
GOWN STRL REUS W/TWL LRG LVL3 (GOWN DISPOSABLE) ×3
KIT BASIN OR (CUSTOM PROCEDURE TRAY) ×2 IMPLANT
KIT TURNOVER KIT B (KITS) ×2 IMPLANT
NS IRRIG 1000ML POUR BTL (IV SOLUTION) ×2 IMPLANT
PACK CV ACCESS (CUSTOM PROCEDURE TRAY) ×2 IMPLANT
PAD ARMBOARD 7.5X6 YLW CONV (MISCELLANEOUS) ×4 IMPLANT
SPONGE GAUZE 2X2 STER 10/PKG (GAUZE/BANDAGES/DRESSINGS) ×1
SPONGE LAP 18X18 RF (DISPOSABLE) ×2 IMPLANT
SUT PROLENE 5 0 C 1 24 (SUTURE) ×2 IMPLANT
SUT PROLENE 6 0 CC (SUTURE) ×4 IMPLANT
SUT SILK 2 0 SH (SUTURE) IMPLANT
SUT SILK 3 0 (SUTURE) ×1
SUT SILK 3-0 18XBRD TIE 12 (SUTURE) ×1 IMPLANT
SUT VIC AB 3-0 SH 27 (SUTURE) ×3
SUT VIC AB 3-0 SH 27X BRD (SUTURE) ×3 IMPLANT
TOWEL GREEN STERILE (TOWEL DISPOSABLE) ×2 IMPLANT
UNDERPAD 30X30 (UNDERPADS AND DIAPERS) ×2 IMPLANT
WATER STERILE IRR 1000ML POUR (IV SOLUTION) ×2 IMPLANT

## 2019-10-03 NOTE — Interval H&P Note (Signed)
History and Physical Interval Note:  10/03/2019 9:23 AM  Jacob [name]  has presented today for surgery, with the diagnosis of end stage renal disease.  The various methods of treatment have been discussed with the patient and family. After consideration of risks, benefits and other options for treatment, the patient has consented to  Procedure(s): BASILIC VEIN TRANSPOSITION LEFT SECOND STAGE (Left) as a surgical intervention.  The patient's history has been reviewed, patient examined, no change in status, stable for surgery.  I have reviewed the patient's chart and labs.  Questions were answered to the patient's satisfaction.     Curt Jews

## 2019-10-03 NOTE — Op Note (Signed)
    OPERATIVE REPORT  DATE OF SURGERY: 10/03/2019  PATIENT: Jacob [name], 68 y.o. male MRN: [id number]  DOB: [birth year]  PRE-OPERATIVE DIAGNOSIS: End-stage renal disease  POST-OPERATIVE DIAGNOSIS:  Same  PROCEDURE: Second stage basilic vein transposition left arm  SURGEON:  Curt Jews, M.D.  PHYSICIAN ASSISTANT: Matt Eveland, PA-C  ANESTHESIA: General  EBL: per anesthesia record  Total I/O In: -  Out: 20 [Blood:20]  BLOOD ADMINISTERED: none  DRAINS: none  SPECIMEN: none  COUNTS CORRECT:  YES  PATIENT DISPOSITION:  PACU - hemodynamically stable  PROCEDURE DETAILS: The patient was taken operating placed supine position where the area of the left arm prepped draped in sterile fashion.  SonoSite ultrasound was used to visualize the level of the basilic vein which was of large caliber.  Incision was made over the antecubital space and then several additional incisions were made in the medial aspect of the upper arm to expose the vein.  The final incision was at the axilla.  Tributary branches were ligated with 3-0 and 4-0 silk ties and divided.  The vein was occluded near the arterial anastomosis and the vein was transected near the arterial anastomosis.  The vein was brought through the harvest tunnel.  The vein was marked to reduce the risk of twisting.  A subcutaneous tunnel was then created from the antecubital space to the axillary incision and the vein was brought back through this tunnel.  The vein was sewn into into itself near the arterial anastomosis with a running 6-0 Prolene suture.  Clamps were removed and excellent thrill was noted.  The wounds irrigated with saline.  Hemostasis left cautery.  Wounds were closed with 3-0 Vicryl in the subcutaneous subcuticular tissue.  Sterile dressing was applied and the patient was transferred to the recovery room in stable condition   [name], M.D., [place] [facility] 10/03/2019 12:34 PM

## 2019-10-03 NOTE — Anesthesia Procedure Notes (Signed)
Procedure Name: LMA Insertion Date/Time: 10/03/2019 10:30 AM Performed by: Trinna Post., CRNA Pre-anesthesia Checklist: Patient identified, Emergency Drugs available, Suction available, Patient being monitored and Timeout performed Patient Re-evaluated:Patient Re-evaluated prior to induction Oxygen Delivery Method: Circle system utilized Preoxygenation: Pre-oxygenation with 100% oxygen Induction Type: IV induction LMA: LMA inserted LMA Size: 4.0 Number of attempts: 1 Placement Confirmation: positive ETCO2 and breath sounds checked- equal and bilateral Tube secured with: Tape Dental Injury: Teeth and Oropharynx as per pre-operative assessment

## 2019-10-03 NOTE — Anesthesia Postprocedure Evaluation (Signed)
Anesthesia Post Note  Patient: Jacob [name]  Procedure(s) Performed: BASILIC VEIN TRANSPOSITION LEFT SECOND STAGE (Left Arm Upper)     Patient location during evaluation: PACU Anesthesia Type: General Level of consciousness: awake and alert Pain management: pain level controlled Vital Signs Assessment: post-procedure vital signs reviewed and stable Respiratory status: spontaneous breathing, nonlabored ventilation, respiratory function stable and patient connected to nasal cannula oxygen Cardiovascular status: blood pressure returned to baseline and stable Postop Assessment: no apparent nausea or vomiting Anesthetic complications: no    Last Vitals:  Vitals:   10/03/19 1250 10/03/19 1300  BP: 107/68 115/64  Pulse: 75 78  Resp: 15 16  Temp:  (!) 36.1 C  SpO2: 100% 95%    Last Pain:  Vitals:   10/03/19 1300  TempSrc:   PainSc: 0-No pain                 Neco Kling P Loman Logan

## 2019-10-03 NOTE — Transfer of Care (Signed)
Immediate Anesthesia Transfer of Care Note  Patient: Jacob [name]  Procedure(s) Performed: BASILIC VEIN TRANSPOSITION LEFT SECOND STAGE (Left Arm Upper)  Patient Location: PACU  Anesthesia Type:General  Level of Consciousness: awake, alert  and oriented  Airway & Oxygen Therapy: Patient Spontanous Breathing and Patient connected to nasal cannula oxygen  Post-op Assessment: Report given to RN and Post -op Vital signs reviewed and stable  Post vital signs: Reviewed and stable  Last Vitals:  Vitals Value Taken Time  BP 129/66 10/03/19 1235  Temp 36.9 C 10/03/19 1235  Pulse 80 10/03/19 1235  Resp 14 10/03/19 1235  SpO2 99 % 10/03/19 1235  Vitals shown include unvalidated device data.  Last Pain:  Vitals:   10/03/19 0906  TempSrc:   PainSc: 2       Patients Stated Pain Goal: 1 (82/95/62 1308)  Complications: No apparent anesthesia complications

## 2019-10-03 NOTE — Anesthesia Preprocedure Evaluation (Addendum)
Anesthesia Evaluation  Patient identified by MRN, date of birth, ID band Patient awake    Reviewed: Allergy & Precautions, NPO status , Patient's Chart, lab work & pertinent test results  Airway Mallampati: II  TM Distance: >3 FB Neck ROM: Full    Dental  (+) Partial Upper, Missing   Pulmonary COPD,  COPD inhaler, Current SmokerPatient did not abstain from smoking.,    Pulmonary exam normal breath sounds clear to auscultation       Cardiovascular hypertension, Pt. on home beta blockers + CAD, + Past MI, + CABG and +CHF  Normal cardiovascular exam Rhythm:Regular Rate:Normal  ECG: rate 73. Normal sinus rhythm Right bundle branch block  ECHO: 1. The left ventricle has mild-moderately reduced systolic function, with an ejection fraction of 40-45%. The cavity size was normal. Mild basal septal hypertrophy. Left ventricular diastolic Doppler parameters are consistent with pseudonormalization.  Elevated left ventricular end-diastolic pressure Left ventricular diffuse hypokinesis.  2. The right ventricle has normal systolic function. The cavity was normal. There is no increase in right ventricular wall thickness.  3. Left atrial size was severely dilated.  4. There was trivial MR, TR and PR.    Neuro/Psych  Neuromuscular disease negative psych ROS   GI/Hepatic GERD  Medicated and Controlled,(+)     substance abuse  ,   Endo/Other  diabetes, Insulin Dependent  Renal/GU ESRF and DialysisRenal diseaseOn M, W, F     Musculoskeletal Chronic lower back pain Gout   Abdominal   Peds  Hematology HLD   Anesthesia Other Findings end stage renal disease  Reproductive/Obstetrics                            Anesthesia Physical Anesthesia Plan  ASA: IV  Anesthesia Plan: General   Post-op Pain Management:    Induction: Intravenous  PONV Risk Score and Plan: 1 and Ondansetron, Dexamethasone and  Treatment may vary due to age or medical condition  Airway Management Planned: LMA  Additional Equipment:   Intra-op Plan:   Post-operative Plan: Extubation in OR  Informed Consent: I have reviewed the patients History and Physical, chart, labs and discussed the procedure including the risks, benefits and alternatives for the proposed anesthesia with the patient or authorized representative who has indicated his/her understanding and acceptance.     Dental advisory given  Plan Discussed with: CRNA  Anesthesia Plan Comments:         Anesthesia Quick Evaluation

## 2019-10-03 NOTE — Discharge Instructions (Signed)
° °  [facility] ° °Discharge Instructions ° °AV Fistula or Graft Surgery for Dialysis Access ° °Please refer to the following instructions for your post-procedure care. Your surgeon or physician assistant will discuss any changes with you. ° °Activity ° °You may drive the day following your surgery, if you are comfortable and no longer taking prescription pain medication. Resume full activity as the soreness in your incision resolves. ° °Bathing/Showering ° °You may shower after you go home. Keep your incision dry for 48 hours. Do not soak in a bathtub, hot tub, or swim until the incision heals completely. You may not shower if you have a hemodialysis catheter. ° °Incision Care ° °Clean your incision with mild soap and water after 48 hours. Pat the area dry with a clean towel. You do not need a bandage unless otherwise instructed. Do not apply any ointments or creams to your incision. You may have skin glue on your incision. Do not peel it off. It will come off on its own in about one week. Your arm may swell a bit after surgery. To reduce swelling use pillows to elevate your arm so it is above your heart. Your doctor will tell you if you need to lightly wrap your arm with an ACE bandage. ° °Diet ° °Resume your normal diet. There are not special food restrictions following this procedure. In order to heal from your surgery, it is CRITICAL to get adequate nutrition. Your body requires vitamins, minerals, and protein. Vegetables are the best source of vitamins and minerals. Vegetables also provide the perfect balance of protein. Processed food has little nutritional value, so try to avoid this. ° °Medications ° °Resume taking all of your medications. If your incision is causing pain, you may take over-the counter pain relievers such as acetaminophen (Tylenol). If you were prescribed a stronger pain medication, please be aware these medications can cause nausea and constipation. Prevent  nausea by taking the medication with a snack or meal. Avoid constipation by drinking plenty of fluids and eating foods with high amount of fiber, such as fruits, vegetables, and grains. Do not take Tylenol if you are taking prescription pain medications. ° ° ° ° °Follow up °Your surgeon may want to see you in the office following your access surgery. If so, this will be arranged at the time of your surgery. ° °Please call us immediately for any of the following conditions: ° °Increased pain, redness, drainage (pus) from your incision site °Fever of 101 degrees or higher °Severe or worsening pain at your incision site °Hand pain or numbness. ° °Reduce your risk of vascular disease: ° °Stop smoking. If you would like help, call [facility] at 1-800-QUIT-NOW (1-800-784-8669) or [place] at 336-586-4000 ° °Manage your cholesterol °Maintain a desired weight °Control your diabetes °Keep your blood pressure down ° °Dialysis ° °It will take several weeks to several months for your new dialysis access to be ready for use. Your surgeon will determine when it is OK to use it. Your nephrologist will continue to direct your dialysis. You can continue to use your Permcath until your new access is ready for use. ° °If you have any questions, please call the office at [phone]. ° °

## 2019-10-04 ENCOUNTER — Telehealth: Payer: Self-pay | Admitting: Nurse Practitioner

## 2019-10-04 DIAGNOSIS — R52 Pain, unspecified: Secondary | ICD-10-CM | POA: Insufficient documentation

## 2019-10-04 NOTE — Telephone Encounter (Signed)
Called and left patient message - we need to reschedule appt. That patient missed in November - We need to change his pcp to [facility] if that is okay with him.  [name] is not her on Tuesdays and Thursdays so it might be more convenient for him to see Dinah.  He needs a follow up appt.  Can be in January if this is agreeable to him.

## 2019-10-08 ENCOUNTER — Encounter: Payer: Self-pay | Admitting: Nurse Practitioner

## 2019-10-08 LAB — HM DIABETES EYE EXAM

## 2019-10-09 LAB — CBC AND DIFFERENTIAL: Hemoglobin: 8.2 — AB (ref 13.5–17.5)

## 2019-10-10 ENCOUNTER — Ambulatory Visit (INDEPENDENT_AMBULATORY_CARE_PROVIDER_SITE_OTHER): Payer: Self-pay | Admitting: Family

## 2019-10-10 ENCOUNTER — Other Ambulatory Visit: Payer: Self-pay

## 2019-10-10 ENCOUNTER — Encounter: Payer: Self-pay | Admitting: Family

## 2019-10-10 VITALS — BP 121/63 | HR 91 | Temp 97.1°F | Resp 20 | Ht 74.0 in | Wt 191.0 lb

## 2019-10-10 DIAGNOSIS — Z992 Dependence on renal dialysis: Secondary | ICD-10-CM

## 2019-10-10 DIAGNOSIS — N186 End stage renal disease: Secondary | ICD-10-CM

## 2019-10-10 DIAGNOSIS — I770 Arteriovenous fistula, acquired: Secondary | ICD-10-CM

## 2019-10-10 DIAGNOSIS — Z4889 Encounter for other specified surgical aftercare: Secondary | ICD-10-CM

## 2019-10-10 NOTE — Progress Notes (Signed)
CC: wound evaluation, previous healed incision left arm   History of Present Illness  Jacob [name] is a 68 y.o. (1951/01/07) male who is s/p second stage basilic vein transposition left arm on 10-03-19 by Dr. Donnetta Hutching.  Pt returns today at the request of his [place] to evaluate left AC wound, pt states this incision had healed, and opened up the early part of this week. He denies any known injury to left [facility] He denies fever or chills.  He is applying an unknown cream to the opened incision at his left [facility], the same cream that he was advised to apply to his AVF by hospital personnel.   He denies chest pain or dyspnea.   He dialyzes via right IJ TDC on MWF at [facility]. He is right hand dominant.   He has had previous access in his left forearm.     Past Medical History:  Diagnosis Date  . Acute respiratory failure ([place]) 03/01/2018  . Arthritis    "all over; mostly knees and back" (02/28/2018)  . CHF (congestive heart failure) (H[facility])    not on any meds  . Chronic lower back pain    stenosis  . Community acquired pneumonia 09/06/2013  . COPD (chronic obstructive pulmonary disease) ([place])   . Coronary atherosclerosis of native coronary artery 2005   s/p surgery  . Drug abuse ([place])    hx; tested for cocaine as recently as 2/08. says he is not using drugs now - avoided defib. for this reason   . ESRD (end stage renal disease) ([place])    [place] M-W-F- Richarda Blade  . GERD (gastroesophageal reflux disease)    takes OTC meds as needed  . GI bleeding 02/06/2019  . Glaucoma    uses eye drops daily  . Hepatitis B 1968   "tx'd w/isolation; caught it from toilet stools in gym"  . History of blood transfusion 03/01/2019  . History of colon polyps    benign  . History of gout    takes Allopurinol daily as well as Colchicine-if needed (02/28/2018)  . History of kidney stones   . HTN (hypertension)    takes Coreg,Imdur.and Apresoline daily  . Human immunodeficiency virus (HIV) disease ([place]) dx'd 1995    takes Genvoya daily  . Hyperlipidemia    takes Atorvastatin daily  . Ischemic cardiomyopathy   . Muscle spasm    takes Zanaflex as needed  . Myocardial infarction ([place]) ~ 2004/2005  . Nocturia   . Peripheral neuropathy    takes gabapentin daily  . Pneumonia    "at least twice" (02/28/2018)  . Shortness of breath dyspnea    rarely but if notices it then with exertion  . Syphilis, unspecified   . Type II diabetes mellitus ([place]) 2004   Lantus daily.Average fasting blood sugar 125-199  . Wears glasses   . Wears partial dentures     Social History Social History   Tobacco Use  . Smoking status: Current Every Day Smoker    Packs/day: 0.50    Years: 43.00    Pack years: 21.50    Types: Cigarettes  . Smokeless tobacco: Never Used  Substance Use Topics  . Alcohol use: Not Currently    Alcohol/week: 12.0 standard drinks    Types: 12 Standard drinks or equivalent per week    Comment: ocassional  . Drug use: No    Types: Cocaine    Comment: hx of crack/cocaine 41yrs ago 10/01/2019- none  Family History Family History  Problem Relation Age of Onset  . Heart failure Father   . Hypertension Father   . Diabetes Brother   . Heart attack Brother   . Alzheimer's disease Mother   . Stroke Sister   . Diabetes Sister   . Alzheimer's disease Sister   . Hypertension Brother   . Diabetes Brother   . Drug abuse Brother   . Colon cancer Neg Hx     Surgical History Past Surgical History:  Procedure Laterality Date  . AV FISTULA PLACEMENT Left 08/02/2018   Procedure: ARTERIOVENOUS (AV) FISTULA CREATION  left arm radiocephlic;  Surgeon: Marty Heck, MD;  Location: [facility];  Service: Vascular;  Laterality: Left;  . AV FISTULA PLACEMENT Left 08/01/2019   Procedure: LEFT BRACHIOCEPHALIC ARTERIOVENOUS (AV) FISTULA CREATION;  Surgeon: [name], MD;  Location: [place];  Service: Vascular;  Laterality: Left;  . BASCILIC VEIN TRANSPOSITION Left 10/03/2019   Procedure: BASILIC  VEIN TRANSPOSITION LEFT SECOND STAGE;  Surgeon: [name], MD;  Location: [place];  Service: Vascular;  Laterality: Left;  . CARDIAC CATHETERIZATION  10/2002; 12/19/2004   [name] 03/08/2011  . COLONOSCOPY  2013   Dr.John Henrene Pastor   . CORONARY ARTERY BYPASS GRAFT  02/24/2003   CABG X2/notes 03/08/2011  . ESOPHAGOGASTRODUODENOSCOPY (EGD) WITH PROPOFOL N/A 02/08/2019   Procedure: ESOPHAGOGASTRODUODENOSCOPY (EGD) WITH PROPOFOL;  Surgeon: Milus Banister, MD;  Location: [facility];  Service: Gastroenterology;  Laterality: N/A;  . HOT HEMOSTASIS N/A 02/08/2019   Procedure: HOT HEMOSTASIS (ARGON PLASMA COAGULATION/BICAP);  Surgeon: Milus Banister, MD;  Location: [facility] ENDOSCOPY;  Service: Gastroenterology;  Laterality: N/A;  . INTERTROCHANTERIC HIP FRACTURE SURGERY Left 11/2006   [name] 03/08/2011  . IR FLUORO GUIDE CV LINE RIGHT  07/24/2019  . IR FLUORO GUIDE CV LINE RIGHT  07/30/2019  . IR US GUIDE VASC ACCESS RIGHT  07/24/2019  . IR US GUIDE VASC ACCESS RIGHT  07/30/2019  . LAPAROSCOPIC CHOLECYSTECTOMY  05/2006  . LIGATION OF COMPETING BRANCHES OF ARTERIOVENOUS FISTULA Left 11/05/2018   Procedure: LIGATION OF COMPETING BRANCHES OF ARTERIOVENOUS FISTULA  LEFT  ARM;  Surgeon: Marty Heck, MD;  Location: [place];  Service: Vascular;  Laterality: Left;  . LUMBAR LAMINECTOMY/DECOMPRESSION MICRODISCECTOMY N/A 02/29/2016   Procedure: Left L4-5 Lateral Recess Decompression, Removal Extradural Intraspinal Facet Cyst;  Surgeon: Marybelle Killings, MD;  Location: [place];  Service: Orthopedics;  Laterality: N/A;  . MULTIPLE TOOTH EXTRACTIONS    . ORIF MANDIBULAR FRACTURE Left 08/13/2004   ORIF of left body fracture mandible with KLS Martin 2.3-mm six hole/notes 03/08/2011    Allergies  Allergen Reactions  . Augmentin [Amoxicillin-Pot Clavulanate] Diarrhea    Severe  . Amphetamines Other (See Comments)    Unknown reaction type    Current Outpatient Medications  Medication Sig Dispense Refill  . acetaminophen  (TYLENOL 8 HOUR) 650 MG CR tablet Take 1,300 mg by mouth every 8 (eight) hours as needed for pain.     [name] allopurinol (ZYLOPRIM) 100 MG tablet TAKE 1 TABLET(100 MG) BY MOUTH DAILY (Patient taking differently: Take 100 mg by mouth daily. ) 90 tablet 1  . aspirin EC 81 MG tablet Take 81 mg by mouth daily.    [name] atorvastatin (LIPITOR) 80 MG tablet Take 1 tablet (80 mg total) by mouth daily. 90 tablet 3  . B-D UF III MINI PEN NEEDLES 31G X 5 MM MISC USE FOUR TIMES DAILY 100 each 11  . BIKTARVY 50-200-25 MG  TABS tablet TAKE 1 TABLET BY MOUTH DAILY 30 tablet 5  . calcitRIOL (ROCALTROL) 0.25 MCG capsule Take 0.25 mcg by mouth daily.     . carvedilol (COREG) 25 MG tablet Take 1 tablet (25 mg total) by mouth 2 (two) times daily with a meal. 180 tablet 3  . Choline Fenofibrate (FENOFIBRIC ACID) 135 MG CPDR TAKE 1 CAPSULE BY MOUTH DAILY (Patient taking differently: Take 135 mg by mouth daily. ) 90 capsule 1  . colchicine 0.6 MG tablet Take 0.6 mg by mouth daily as needed (pain).    [name] diclofenac sodium (VOLTAREN) 1 % GEL Apply 1 application topically 4 (four) times daily as needed for pain.    [name] gabapentin (NEURONTIN) 300 MG capsule Take 300 mg by mouth 3 (three) times daily.    [name] glucose blood test strip Use to test blood sugar three times daily. E11.40 300 each 3  . HYDROcodone-acetaminophen (NORCO) 5-325 MG tablet Take 1 tablet by mouth every 6 (six) hours as needed for moderate pain. 20 tablet 0  . insulin lispro (HUMALOG) 100 UNIT/ML injection Inject 20 Units into the skin daily as needed for high blood sugar (above 150).     . isosorbide mononitrate (IMDUR) 30 MG 24 hr tablet TAKE 1 TABLET(30 MG) BY MOUTH DAILY (Patient taking differently: Take 30 mg by mouth daily. ) 90 tablet 1  . Lancets (ONETOUCH DELICA PLUS [id number]) MISC Inject 1 Device as directed 3 (three) times daily. Dx: E11.40 300 each 3  . latanoprost (XALATAN) 0.005 % ophthalmic solution Place 1 drop into both eyes at bedtime.    .  multivitamin (RENA-VIT) TABS tablet Take 1 tablet by mouth daily.    . nitroGLYCERIN (NITROSTAT) 0.3 MG SL tablet ONE TABLET UNDER TONGUE AS NEEDED FOR CHEST PAIN (Patient taking differently: Place 0.3 mg under the tongue every 5 (five) minutes as needed for chest pain. ) 25 tablet 4  . pantoprazole (PROTONIX) 40 MG tablet Take 1 tablet (40 mg total) by mouth daily. 90 tablet 1  . sevelamer carbonate (RENVELA) 800 MG tablet Take 800 mg by mouth 3 (three) times daily with meals.    . simethicone (MYLICON) 80 MG chewable tablet Chew 1 tablet (80 mg total) by mouth 4 (four) times daily as needed for flatulence. 90 tablet 3  . timolol (TIMOPTIC) 0.5 % ophthalmic solution Place 1 drop into both eyes every morning.    . umeclidinium-vilanterol (ANORO ELLIPTA) 62.5-25 MCG/INH AEPB INHALE 1 PUFF BY MOUTH EVERY DAY (Patient taking differently: Inhale 1 puff into the lungs daily. ) 60 each 5   No current facility-administered medications for this visit.     REVIEW OF SYSTEMS: see HPI for pertinent positives and negatives    PHYSICAL EXAMINATION:  Vitals:   10/10/19 1231  BP: 121/63  Pulse: 91  Resp: 20  Temp: (!) 97.1 F (36.2 C)  TempSrc: Oral  SpO2: 98%  Weight: 191 lb (86.6 kg)  Height: 6\' 2"  (1.88 m)   Body mass index is 24.52 kg/m.  General: The patient appears his stated age, in NAD.   HEENT:  No gross abnormalities Pulmonary: Respirations are non-labored, fair air movement in all fields, no rales, rhonchi, or wheezes Abdomen: Soft and non-tender with normal bowel sounds. Musculoskeletal: There are no major deformities.   Neurologic: No focal weakness or paresthesias are detected, bilateral hand grip strength is 5/5 Skin: There are no ulcer or rashes noted. See photo below.    Left AC incision opening, no  drainage, no erythema, no swelling     Psychiatric: The patient has normal affect. Cardiovascular: There is a regular rate and rhythm without significant murmur appreciated.    Left radial pulse is 1+ palpable. Right IJ TDC noted, dressing intact Left upper arm AVF with palpable thrill and audible bruit more distally    Medical Decision Making  Jacob [name] is a 68 y.o. male who is s/p second stage basilic vein transposition left arm on 10-03-19 by Dr. Donnetta Hutching.  Pt returns today at the request of his [place] to evaluate left AC wound, pt states this incision had healed, and opened up the early part of this week. He denies any known injury to left [facility] He denies fever or chills.   He dialyzes via right IJ TDC on MWF at [facility].  I discussed with Dr. Scot Dock pt pertinent HPI and exam results. Local wound care: wash gently, daily, with liquid Dial soap, pat dry, apply antibiotic ointment and cover with small gauze dressing, secured with paper tape.   I advised pt to notify [facility] if he develops fever or chills, or if the left AC shallow wound seems like it is getting infected.   Follow up as already scheduled next month.    Clemon Chambers, RN, MSN, FNP-C [facility] Office: [phone]  10/10/2019, 1:24 PM  Clinic MD: Scot Dock

## 2019-10-16 LAB — CBC AND DIFFERENTIAL: Hemoglobin: 7.4 — AB (ref 13.5–17.5)

## 2019-10-21 ENCOUNTER — Emergency Department (HOSPITAL_COMMUNITY): Payer: Medicare Other

## 2019-10-21 ENCOUNTER — Encounter: Payer: Self-pay | Admitting: Nurse Practitioner

## 2019-10-21 ENCOUNTER — Telehealth: Payer: Self-pay | Admitting: *Deleted

## 2019-10-21 ENCOUNTER — Telehealth: Payer: Self-pay

## 2019-10-21 ENCOUNTER — Other Ambulatory Visit: Payer: Self-pay

## 2019-10-21 ENCOUNTER — Emergency Department (HOSPITAL_COMMUNITY)
Admission: EM | Admit: 2019-10-21 | Discharge: 2019-10-21 | Disposition: A | Discharge status: Home / Self Care | Payer: Medicare Other | Attending: Emergency Medicine | Admitting: Emergency Medicine

## 2019-10-21 ENCOUNTER — Encounter (HOSPITAL_COMMUNITY): Payer: Self-pay | Admitting: *Deleted

## 2019-10-21 DIAGNOSIS — N186 End stage renal disease: Secondary | ICD-10-CM

## 2019-10-21 DIAGNOSIS — R748 Abnormal levels of other serum enzymes: Secondary | ICD-10-CM | POA: Diagnosis not present

## 2019-10-21 DIAGNOSIS — J449 Chronic obstructive pulmonary disease, unspecified: Secondary | ICD-10-CM | POA: Insufficient documentation

## 2019-10-21 DIAGNOSIS — Z79899 Other long term (current) drug therapy: Secondary | ICD-10-CM | POA: Diagnosis not present

## 2019-10-21 DIAGNOSIS — I132 Hypertensive heart and chronic kidney disease with heart failure and with stage 5 chronic kidney disease, or end stage renal disease: Secondary | ICD-10-CM | POA: Insufficient documentation

## 2019-10-21 DIAGNOSIS — I509 Heart failure, unspecified: Secondary | ICD-10-CM | POA: Insufficient documentation

## 2019-10-21 DIAGNOSIS — B20 Human immunodeficiency virus [HIV] disease: Secondary | ICD-10-CM | POA: Insufficient documentation

## 2019-10-21 DIAGNOSIS — D649 Anemia, unspecified: Secondary | ICD-10-CM | POA: Insufficient documentation

## 2019-10-21 DIAGNOSIS — E1122 Type 2 diabetes mellitus with diabetic chronic kidney disease: Secondary | ICD-10-CM | POA: Insufficient documentation

## 2019-10-21 DIAGNOSIS — F17210 Nicotine dependence, cigarettes, uncomplicated: Secondary | ICD-10-CM | POA: Insufficient documentation

## 2019-10-21 DIAGNOSIS — Z951 Presence of aortocoronary bypass graft: Secondary | ICD-10-CM | POA: Diagnosis not present

## 2019-10-21 DIAGNOSIS — Z7982 Long term (current) use of aspirin: Secondary | ICD-10-CM | POA: Diagnosis not present

## 2019-10-21 DIAGNOSIS — Z794 Long term (current) use of insulin: Secondary | ICD-10-CM | POA: Diagnosis not present

## 2019-10-21 LAB — COMPREHENSIVE METABOLIC PANEL
ALT: 214 U/L — ABNORMAL HIGH (ref 0–44)
AST: 177 U/L — ABNORMAL HIGH (ref 15–41)
Albumin: 3.3 g/dL — ABNORMAL LOW (ref 3.5–5.0)
Alkaline Phosphatase: 86 U/L (ref 38–126)
Anion gap: 14 (ref 5–15)
BUN: 55 mg/dL — ABNORMAL HIGH (ref 8–23)
CO2: 26 mmol/L (ref 22–32)
Calcium: 8.9 mg/dL (ref 8.9–10.3)
Chloride: 93 mmol/L — ABNORMAL LOW (ref 98–111)
Creatinine, Ser: 7.06 mg/dL — ABNORMAL HIGH (ref 0.61–1.24)
GFR calc Af Amer: 8 mL/min — ABNORMAL LOW (ref >60)
GFR calc non Af Amer: 7 mL/min — ABNORMAL LOW (ref >60)
Glucose, Bld: 210 mg/dL — ABNORMAL HIGH (ref 70–99)
Potassium: 3.5 mmol/L (ref 3.5–5.1)
Sodium: 133 mmol/L — ABNORMAL LOW (ref 135–145)
Total Bilirubin: 1 mg/dL (ref 0.3–1.2)
Total Protein: 7.4 g/dL (ref 6.5–8.1)

## 2019-10-21 LAB — TYPE AND SCREEN
ABO/RH(D): A POS
Antibody Screen: NEGATIVE

## 2019-10-21 LAB — CBC
HCT: 26.5 % — ABNORMAL LOW (ref 39.0–52.0)
Hemoglobin: 8.1 g/dL — ABNORMAL LOW (ref 13.0–17.0)
MCH: 30.9 pg (ref 26.0–34.0)
MCHC: 30.6 g/dL (ref 30.0–36.0)
MCV: 101.1 fL — ABNORMAL HIGH (ref 80.0–100.0)
Platelets: 230 10*3/uL (ref 150–400)
RBC: 2.62 MIL/uL — ABNORMAL LOW (ref 4.22–5.81)
RDW: 15.4 % (ref 11.5–15.5)
WBC: 5.4 10*3/uL (ref 4.0–10.5)
nRBC: 0 % (ref 0.0–0.2)

## 2019-10-21 LAB — POC OCCULT BLOOD, ED: Fecal Occult Bld: POSITIVE — AB

## 2019-10-21 NOTE — Discharge Instructions (Addendum)
Your hemoglobin is 8.1.  Please call Dr. Henrene Pastor for follow regarding your rectal problems and bleeding from the rectum. Return if you have increased bleeding, weakness,chest pain or shortness of breath Avoid alcohol- your liver enzymes are elevated.  Please discuss with your doctor or Dr. Henrene Pastor for recheck of these. Fluid restrict as discussed.  Call your dialysis caregiver for dialysis asap.

## 2019-10-21 NOTE — ED Provider Notes (Signed)
[facility] EMERGENCY DEPARTMENT Provider Note   CSN: [id number] Arrival date & time: 10/21/19  3474     History Chief Complaint  Patient presents with  . Anemia    Jacob [name] is a 68 y.o. male.  HPI  68 year old male end-stage renal disease on dialysis, presents to the ED with reports that he is anemic.  He had his blood drawn yesterday and reports that his hemoglobin was 7.  He has had some generalized weakness.  He denies any dyspnea.  He has noticed some blood on his toilet paper but has not had any blood in stool.  He has been seen and evaluated for this previously.  Review of his chart reveals that he has been anemic previously and has received 1 unit of packed red blood cells to have a hemoglobin below 8.  Is felt that his anemia has component of anemia of chronic disease, end-stage renal disease, as well as some due to occult rectal bleeding.  Supposed to be followed by Dr. Henrene Pastor for GI.  He has not contacted him yet.  He denies any syncope, chest pain, or dyspnea.     Past Medical History:  Diagnosis Date  . Acute respiratory failure ([place]) 03/01/2018  . Arthritis    "all over; mostly knees and back" (02/28/2018)  . CHF (congestive heart failure) (HCC)    not on any meds  . Chronic lower back pain    stenosis  . Community acquired pneumonia 09/06/2013  . COPD (chronic obstructive pulmonary disease) ([place])   . Coronary atherosclerosis of native coronary artery 2005   s/p surgery  . Drug abuse ([place])    hx; tested for cocaine as recently as 2/08. says he is not using drugs now - avoided defib. for this reason   . ESRD (end stage renal disease) ([place])    Hemo M-W-F- Richarda Blade  . GERD (gastroesophageal reflux disease)    takes OTC meds as needed  . GI bleeding 02/06/2019  . Glaucoma    uses eye drops daily  . Hepatitis B 1968   "tx'd w/isolation; caught it from toilet stools in gym"  . History of blood transfusion 03/01/2019  . History of colon polyps     benign  . History of gout    takes Allopurinol daily as well as Colchicine-if needed (02/28/2018)  . History of kidney stones   . HTN (hypertension)    takes Coreg,Imdur.and Apresoline daily  . Human immunodeficiency virus (HIV) disease ([place]) dx'd 1995   takes Genvoya daily  . Hyperlipidemia    takes Atorvastatin daily  . Ischemic cardiomyopathy   . Muscle spasm    takes Zanaflex as needed  . Myocardial infarction ([place]) ~ 2004/2005  . Nocturia   . Peripheral neuropathy    takes gabapentin daily  . Pneumonia    "at least twice" (02/28/2018)  . Shortness of breath dyspnea    rarely but if notices it then with exertion  . Syphilis, unspecified   . Type II diabetes mellitus ([place]) 2004   Lantus daily.Average fasting blood sugar 125-199  . Wears glasses   . Wears partial dentures     Patient Active Problem List   Diagnosis Date Noted  . ESRD (end stage renal disease) on dialysis ([place])   . Hand pain, left   . Cocaine abuse ([place])   . Hypertensive emergency   . CHF exacerbation ([place]) 07/21/2019  . Diabetic foot ulcer ([place]) 02/11/2019  . Gastric AVM   .  CKD stage 4 due to type 2 diabetes mellitus ([place]) 05/30/2018  . Hyperlipidemia associated with type 2 diabetes mellitus ([place]) 05/30/2018  . Right foot ulcer ([place]) 02/28/2018  . Chronic obstructive pulmonary disease ([place]) 02/28/2018  . Anemia of chronic disease 11/20/2016  . CKD (chronic kidney disease) stage 3, GFR 30-59 ml/min 11/20/2016  . CHF (congestive heart failure) ([place]) 11/10/2016  . Pneumonia 08/02/2016  . History of lumbar laminectomy for spinal cord decompression 02/29/2016  . Type 2 diabetes mellitus with vascular disease ([place]) 06/17/2015  . HTN (hypertension) 04/26/2015  . DM (diabetes mellitus) ([place]) 04/26/2015  . Ischemic cardiomyopathy 05/12/2014  . Ulcer of lower extremity ([place]) 09/06/2013  . Chest pain 09/06/2013  . Type II or unspecified type diabetes mellitus with unspecified complication, not stated as  uncontrolled 10/22/2012  . Bunion of left foot 11/25/2011  . Bunion, right foot 11/25/2011  . Polysubstance abuse ([place]) 07/28/2011  . Hip fracture, left ([place]) 04/04/2011  . Closed fracture of neck of femur ([place]) 04/04/2011  . Insomnia 02/18/2011  . CAD (coronary artery disease) 04/20/2009  . Acute on chronic combined systolic (congestive) and diastolic (congestive) heart failure ([place]) 04/20/2009  . HLD (hyperlipidemia) 11/20/2006  . Gout 11/20/2006  . TOBACCO ABUSE 11/20/2006  . Essential hypertension 11/20/2006  . Human immunodeficiency virus (HIV) disease ([place]) 09/02/2006    Past Surgical History:  Procedure Laterality Date  . AV FISTULA PLACEMENT Left 08/02/2018   Procedure: ARTERIOVENOUS (AV) FISTULA CREATION  left arm radiocephlic;  Surgeon: Marty Heck, MD;  Location: [facility];  Service: Vascular;  Laterality: Left;  . AV FISTULA PLACEMENT Left 08/01/2019   Procedure: LEFT BRACHIOCEPHALIC ARTERIOVENOUS (AV) FISTULA CREATION;  Surgeon: [name], MD;  Location: [facility];  Service: Vascular;  Laterality: Left;  . BASCILIC VEIN TRANSPOSITION Left 10/03/2019   Procedure: BASILIC VEIN TRANSPOSITION LEFT SECOND STAGE;  Surgeon: [name], MD;  Location: [facility];  Service: Vascular;  Laterality: Left;  . CARDIAC CATHETERIZATION  10/2002; 12/19/2004   [name] 03/08/2011  . COLONOSCOPY  2013   Dr.John Henrene Pastor   . CORONARY ARTERY BYPASS GRAFT  02/24/2003   CABG X2/notes 03/08/2011  . ESOPHAGOGASTRODUODENOSCOPY (EGD) WITH PROPOFOL N/A 02/08/2019   Procedure: ESOPHAGOGASTRODUODENOSCOPY (EGD) WITH PROPOFOL;  Surgeon: Milus Banister, MD;  Location: [name];  Service: Gastroenterology;  Laterality: N/A;  . HOT HEMOSTASIS N/A 02/08/2019   Procedure: HOT HEMOSTASIS (ARGON PLASMA COAGULATION/BICAP);  Surgeon: Milus Banister, MD;  Location: [facility] ENDOSCOPY;  Service: Gastroenterology;  Laterality: N/A;  . INTERTROCHANTERIC HIP FRACTURE SURGERY Left 11/2006   [name] 03/08/2011  . IR FLUORO  GUIDE CV LINE RIGHT  07/24/2019  . IR FLUORO GUIDE CV LINE RIGHT  07/30/2019  . IR US GUIDE VASC ACCESS RIGHT  07/24/2019  . IR US GUIDE VASC ACCESS RIGHT  07/30/2019  . LAPAROSCOPIC CHOLECYSTECTOMY  05/2006  . LIGATION OF COMPETING BRANCHES OF ARTERIOVENOUS FISTULA Left 11/05/2018   Procedure: LIGATION OF COMPETING BRANCHES OF ARTERIOVENOUS FISTULA  LEFT  ARM;  Surgeon: Marty Heck, MD;  Location: [facility];  Service: Vascular;  Laterality: Left;  . LUMBAR LAMINECTOMY/DECOMPRESSION MICRODISCECTOMY N/A 02/29/2016   Procedure: Left L4-5 Lateral Recess Decompression, Removal Extradural Intraspinal Facet Cyst;  Surgeon: Marybelle Killings, MD;  Location: [facility];  Service: Orthopedics;  Laterality: N/A;  . MULTIPLE TOOTH EXTRACTIONS    . ORIF MANDIBULAR FRACTURE Left 08/13/2004   ORIF of left body fracture mandible with KLS Martin 2.3-mm six hole/notes 03/08/2011  Family History  Problem Relation Age of Onset  . Heart failure Father   . Hypertension Father   . Diabetes Brother   . Heart attack Brother   . Alzheimer's disease Mother   . Stroke Sister   . Diabetes Sister   . Alzheimer's disease Sister   . Hypertension Brother   . Diabetes Brother   . Drug abuse Brother   . Colon cancer Neg Hx     Social History   Tobacco Use  . Smoking status: Current Every Day Smoker    Packs/day: 0.50    Years: 43.00    Pack years: 21.50    Types: Cigarettes  . Smokeless tobacco: Never Used  Substance Use Topics  . Alcohol use: Not Currently    Alcohol/week: 12.0 standard drinks    Types: 12 Standard drinks or equivalent per week    Comment: ocassional  . Drug use: No    Types: Cocaine    Comment: hx of crack/cocaine 52yrs ago 10/01/2019- none    Home Medications Prior to Admission medications   Medication Sig Start Date End Date Taking? Authorizing Provider  acetaminophen (TYLENOL 8 HOUR) 650 MG CR tablet Take 1,300 mg by mouth every 8 (eight) hours as needed for pain.     [provider]  allopurinol (ZYLOPRIM) 100 MG tablet TAKE 1 TABLET(100 MG) BY MOUTH DAILY Patient taking differently: Take 100 mg by mouth daily.  07/15/19   [name], NP  aspirin EC 81 MG tablet Take 81 mg by mouth daily.    [provider]  atorvastatin (LIPITOR) 80 MG tablet Take 1 tablet (80 mg total) by mouth daily. 12/05/18   [name], NP  B-D UF III MINI PEN NEEDLES 31G X 5 MM MISC USE FOUR TIMES DAILY 02/25/19   [name], NP  BIKTARVY 50-200-25 MG TABS tablet TAKE 1 TABLET BY MOUTH DAILY 07/12/19   [name], MD  calcitRIOL (ROCALTROL) 0.25 MCG capsule Take 0.25 mcg by mouth daily.  06/07/19   [provider]  carvedilol (COREG) 25 MG tablet Take 1 tablet (25 mg total) by mouth 2 (two) times daily with a meal. 04/16/19   [name], MD  Choline Fenofibrate (FENOFIBRIC ACID) 135 MG CPDR TAKE 1 CAPSULE BY MOUTH DAILY Patient taking differently: Take 135 mg by mouth daily.  09/23/19   [name], NP  colchicine 0.6 MG tablet Take 0.6 mg by mouth daily as needed (pain).    [provider]  diclofenac sodium (VOLTAREN) 1 % GEL Apply 1 application topically 4 (four) times daily as needed for pain. 07/19/19   [provider]  gabapentin (NEURONTIN) 300 MG capsule Take 300 mg by mouth 3 (three) times daily.    [provider]  glucose blood test strip Use to test blood sugar three times daily. E11.40 04/15/19   [name], NP  HYDROcodone-acetaminophen (NORCO) 5-325 MG tablet Take 1 tablet by mouth every 6 (six) hours as needed for moderate pain. 10/03/19   Dagoberto Ligas, PA-C  insulin lispro (HUMALOG) 100 UNIT/ML injection Inject 20 Units into the skin daily as needed for high blood sugar (above 150).     [provider]  isosorbide mononitrate (IMDUR) 30 MG 24 hr tablet TAKE 1 TABLET(30 MG) BY MOUTH DAILY Patient taking differently: Take 30 mg by mouth daily.  08/09/19   [name], NP    Lancets (ONETOUCH DELICA PLUS RJJOAC16S) MISC Inject 1 Device as  directed 3 (three) times daily. Dx: E11.40 09/18/19   [name], NP  latanoprost (XALATAN) 0.005 % ophthalmic solution Place 1 drop into both eyes at bedtime.    [provider]  multivitamin (RENA-VIT) TABS tablet Take 1 tablet by mouth daily.    [provider]  nitroGLYCERIN (NITROSTAT) 0.3 MG SL tablet ONE TABLET UNDER TONGUE AS NEEDED FOR CHEST PAIN Patient taking differently: Place 0.3 mg under the tongue every 5 (five) minutes as needed for chest pain.  05/01/19   [name], MD  pantoprazole (PROTONIX) 40 MG tablet Take 1 tablet (40 mg total) by mouth daily. 05/16/19   [name], NP  sevelamer carbonate (RENVELA) 800 MG tablet Take 800 mg by mouth 3 (three) times daily with meals.    [provider]  simethicone (MYLICON) 80 MG chewable tablet Chew 1 tablet (80 mg total) by mouth 4 (four) times daily as needed for flatulence. 08/16/19   [name], NP  timolol (TIMOPTIC) 0.5 % ophthalmic solution Place 1 drop into both eyes every morning. 04/04/19   [provider]  umeclidinium-vilanterol (ANORO ELLIPTA) 62.5-25 MCG/INH AEPB INHALE 1 PUFF BY MOUTH EVERY DAY Patient taking differently: Inhale 1 puff into the lungs daily.  01/15/19   [name], NP    Allergies    Augmentin [amoxicillin-pot clavulanate] and Amphetamines  Review of Systems   Review of Systems  All other systems reviewed and are negative.   Physical Exam Updated Vital Signs BP 125/60   Pulse 84   Temp 97.8 F (36.6 C) (Oral)   Resp 16   Wt 86.6 kg   SpO2 97%   BMI 24.52 kg/m   Physical Exam Vitals and nursing note reviewed.  Constitutional:      Appearance: Normal appearance.  HENT:     Head: Normocephalic and atraumatic.     Right Ear: External ear normal.     Left Ear: External ear normal.     Nose: Nose normal.     Mouth/Throat:     Mouth: Mucous membranes are  moist.  Eyes:     Pupils: Pupils are equal, round, and reactive to light.  Cardiovascular:     Rate and Rhythm: Normal rate and regular rhythm.     Comments: Dialysis catheter right chest wall no signs of infection or bleeding Pulmonary:     Effort: Pulmonary effort is normal.     Breath sounds: Normal breath sounds.  Abdominal:     General: Abdomen is flat.     Palpations: Abdomen is soft.  Musculoskeletal:        General: Normal range of motion.     Cervical back: Normal range of motion.     Comments: Left upper arm fistula in place well-healing  Skin:    General: Skin is warm and dry.     Capillary Refill: Capillary refill takes less than 2 seconds.  Neurological:     General: No focal deficit present.     Mental Status: He is alert. Mental status is at baseline.  Psychiatric:        Mood and Affect: Mood normal.     ED Results / Procedures / Treatments   Labs (all labs ordered are listed, but only abnormal results are displayed) Labs Reviewed  COMPREHENSIVE METABOLIC PANEL - Abnormal; Notable for the following components:      Result Value   Sodium 133 (*)    Chloride 93 (*)    Glucose,  Bld 210 (*)    BUN 55 (*)    Creatinine, Ser 7.06 (*)    Albumin 3.3 (*)    AST 177 (*)    ALT 214 (*)    GFR calc non Af Amer 7 (*)    GFR calc Af Amer 8 (*)    All other components within normal limits  CBC - Abnormal; Notable for the following components:   RBC 2.62 (*)    Hemoglobin 8.1 (*)    HCT 26.5 (*)    MCV 101.1 (*)    All other components within normal limits  POC OCCULT BLOOD, ED - Abnormal; Notable for the following components:   Fecal Occult Bld POSITIVE (*)    All other components within normal limits  TYPE AND SCREEN    EKG EKG Interpretation  Date/Time:  Monday October 21 2019 13:48:38 EST Ventricular Rate:  82 PR Interval:    QRS Duration: 146 QT Interval:  445 QTC Calculation: 520 R Axis:   6 Text Interpretation: Sinus rhythm Probable left  atrial enlargement Right bundle branch block Inferolateral infarct, old Confirmed by Pattricia Boss [phone]) on 10/21/2019 2:18:13 PM   Radiology DG Chest Port 1 View  Result Date: 10/21/2019 CLINICAL DATA:  Dyspnea, low hemoglobin EXAM: PORTABLE CHEST 1 VIEW COMPARISON:  07/25/2019 FINDINGS: Tunneled right dialysis catheter tip overlies the central SVC. Mild chronic interstitial prominence. Stable cardiomegaly. Evidence of prior cardiac surgery. No significant pleural effusion. No pneumothorax. IMPRESSION: Mild chronic interstitial prominence. There may be mild superimposed interstitial edema. Electronically Signed   By: Macy Mis M.D.   On: 10/21/2019 13:47    Procedures Procedures (including critical care time)  Medications Ordered in ED Medications - No data to display  ED Course  I have reviewed the triage vital signs and the nursing notes.  Pertinent labs & imaging results that were available during my care of the patient were reviewed by me and considered in my medical decision making (see chart for details).    MDM Rules/Calculators/A&P                      68 year old male with chronic anemia and hemoglobin of 8.1 here.  He appears hemodynamically stable.  He does not have a current indication for transfusion.  He did miss his dialysis today.  His potassium is normal.  His creatinine is 7.  He does not appear to be volume overloaded.  I have discussed his care with nephrology.  I have offered to discuss with the nephrology navigator to have his dialysis sooner than Wednesday.  He states that he does not need to have it sooner and will restrict his fluids.  He is a alcoholic and has continued to drink.  His transaminases are elevated.  Is advised to stop drinking alcohol.  We have discussed return precautions and need for follow-up and he voiced understanding. Final Clinical Impression(s) / ED Diagnoses Final diagnoses:  Anemia, unspecified type  ESRD (end stage renal disease)  ([place])  Elevated liver enzymes    Rx / DC Orders ED Discharge Orders    None    Your hemoglobin is 8.1.  Please call Dr. Henrene Pastor for follow regarding your rectal problems and bleeding from the rectum. Return if you have increased bleeding, weakness,chest pain or shortness of breath Avoid alcohol- your liver enzymes are elevated.  Please discuss with your doctor or Dr. Henrene Pastor for recheck of these. Fluid restrict as discussed.  Call your dialysis caregiver  for dialysis asap.    Pattricia Boss, MD 10/21/19 [phone]

## 2019-10-21 NOTE — Telephone Encounter (Signed)
Patient asked dialysis to send him to the ER.

## 2019-10-21 NOTE — Telephone Encounter (Signed)
Patient called back.  He was told he needed an evaluation before sending for a blood transfusion.  He declined to come to the [facility] office.  He declined to go to the ER.  He stated he was going to dialysis today and if there was a doctor there they could evaluate him. He called again and asked if we had gotten the information from dialysis.  Dialysis did fax lab values, but no documentation.  I explained to him that he would need to be evaluated, to determine why there was a drop in hemoglobin.

## 2019-10-21 NOTE — ED Notes (Signed)
Pt discharge instructions reviewed with the patient. The patient verbalized understanding of instructions. Pt discharged. 

## 2019-10-21 NOTE — Telephone Encounter (Signed)
We do not do a referral to the ED, generally nephrologist will help set up for transfusion due to anemia related to CKD. there must be more going on if they want him to go to the ED for evaluation

## 2019-10-21 NOTE — ED Notes (Signed)
Restriction band placed on pt's left wrist.

## 2019-10-21 NOTE — Telephone Encounter (Signed)
Patient had left message with answering service early this am. "Weak, bleeding from catheter in chest and needs blood transfusion". I called patient who confirmed that is what is going on. I instructed patient to call 911 and get to [facility] ER for evaluation. Verbalized understanding.

## 2019-10-21 NOTE — Telephone Encounter (Signed)
Patient called stating he needed a referral to the ER or short stay for a blood transfusion.  I tried to get him to come into the office for an appointment/evaluation and he declined. He said at dialysis on Saturday they told him he needed to go for an evaluation. I talked with Caren Griffins [phone]), nurse at dialysis center.  She stated his hemoglobins had been trending down and patient had been complaining of blood when wiping.  Dialysis stated they suggested he go to the ER, but he declined, said he was too weak and couldn't sit in the ER all day.   Hemoglobins   12/23 -  7.4 12/16    8.2 12/9      8.8  Dialysis stated she would fax labs to [place].   He is scheduled for dialysis today and stated he was going to try to go.

## 2019-10-21 NOTE — ED Triage Notes (Signed)
Pt was sent by dialysis center due to low Hgb.  Pt was supposed to have HD today but was told to come to the emergency room.  Pt had his last HD treatment on Saturday.  His ususal days are MWF (this was changed due to the holiday).  I called the HD center and was told that pt reported bloody stools to them and his hgb was 7.4 on 12/23, no blood was checked on Saturday.  Pt reports that he has been having weakness.  No CP and no sob.

## 2019-10-22 ENCOUNTER — Telehealth: Payer: Self-pay | Admitting: Internal Medicine

## 2019-10-22 NOTE — Telephone Encounter (Signed)
Pt returning call

## 2019-10-22 NOTE — Telephone Encounter (Signed)
Left message on machine to call back  

## 2019-10-22 NOTE — Telephone Encounter (Signed)
Pt called stating that he has been noticing blood after a bm since about a week ago. His dialysis doctor told him to call Dr. Henrene Pastor for advise.

## 2019-10-23 ENCOUNTER — Telehealth: Payer: Self-pay | Admitting: Internal Medicine

## 2019-10-23 NOTE — Telephone Encounter (Signed)
See alternate phone note  

## 2019-10-23 NOTE — Telephone Encounter (Signed)
In the ED 12/28 for rectal bleeding was told to follow up with Dr Henrene Pastor. Small amount of BRBPR last episode was Tuesday.  He states the bleeding was never a large amount but did concern him enough to go to the ED for evaluation.    ED Discharge Orders    None    Your hemoglobin is 8.1.  Please call Dr. Henrene Pastor for follow regarding your rectal problems and bleeding from the rectum. Return if you have increased bleeding, weakness,chest pain or shortness of breath Avoid alcohol- your liver enzymes are elevated.  Please discuss with your doctor or Dr. Henrene Pastor for recheck of these. Fluid restrict as discussed.  Call your dialysis caregiver for dialysis asap.    Jacob Boss, MD 10/21/19 1510  I have scheduled him for an appt with [name] Savoy on 1/14 and advised him to return call if his bleeding returns.  The pt has been advised of the information and verbalized understanding.

## 2019-10-28 ENCOUNTER — Other Ambulatory Visit: Payer: Self-pay

## 2019-10-28 DIAGNOSIS — N184 Chronic kidney disease, stage 4 (severe): Secondary | ICD-10-CM

## 2019-10-28 DIAGNOSIS — E1122 Type 2 diabetes mellitus with diabetic chronic kidney disease: Secondary | ICD-10-CM

## 2019-10-29 ENCOUNTER — Encounter: Payer: Self-pay | Admitting: Nurse Practitioner

## 2019-10-29 ENCOUNTER — Other Ambulatory Visit: Payer: Self-pay

## 2019-10-29 ENCOUNTER — Ambulatory Visit (HOSPITAL_COMMUNITY)
Admission: RE | Admit: 2019-10-29 | Discharge: 2019-10-29 | Disposition: A | Discharge status: Home / Self Care | Payer: Medicare Other | Source: Ambulatory Visit | Attending: Vascular Surgery | Admitting: Vascular Surgery

## 2019-10-29 ENCOUNTER — Ambulatory Visit (INDEPENDENT_AMBULATORY_CARE_PROVIDER_SITE_OTHER): Payer: Medicare Other | Admitting: Nurse Practitioner

## 2019-10-29 ENCOUNTER — Ambulatory Visit (INDEPENDENT_AMBULATORY_CARE_PROVIDER_SITE_OTHER): Payer: Self-pay | Admitting: Physician Assistant

## 2019-10-29 VITALS — BP 145/74 | HR 82 | Temp 97.2°F | Ht 74.0 in | Wt 198.0 lb

## 2019-10-29 DIAGNOSIS — D638 Anemia in other chronic diseases classified elsewhere: Secondary | ICD-10-CM

## 2019-10-29 DIAGNOSIS — Z794 Long term (current) use of insulin: Secondary | ICD-10-CM

## 2019-10-29 DIAGNOSIS — E1169 Type 2 diabetes mellitus with other specified complication: Secondary | ICD-10-CM

## 2019-10-29 DIAGNOSIS — E1140 Type 2 diabetes mellitus with diabetic neuropathy, unspecified: Secondary | ICD-10-CM

## 2019-10-29 DIAGNOSIS — E1122 Type 2 diabetes mellitus with diabetic chronic kidney disease: Secondary | ICD-10-CM | POA: Insufficient documentation

## 2019-10-29 DIAGNOSIS — N186 End stage renal disease: Secondary | ICD-10-CM

## 2019-10-29 DIAGNOSIS — I2510 Atherosclerotic heart disease of native coronary artery without angina pectoris: Secondary | ICD-10-CM

## 2019-10-29 DIAGNOSIS — Z992 Dependence on renal dialysis: Secondary | ICD-10-CM

## 2019-10-29 DIAGNOSIS — N184 Chronic kidney disease, stage 4 (severe): Secondary | ICD-10-CM | POA: Insufficient documentation

## 2019-10-29 DIAGNOSIS — I10 Essential (primary) hypertension: Secondary | ICD-10-CM

## 2019-10-29 DIAGNOSIS — I5042 Chronic combined systolic (congestive) and diastolic (congestive) heart failure: Secondary | ICD-10-CM

## 2019-10-29 DIAGNOSIS — K625 Hemorrhage of anus and rectum: Secondary | ICD-10-CM

## 2019-10-29 DIAGNOSIS — J449 Chronic obstructive pulmonary disease, unspecified: Secondary | ICD-10-CM

## 2019-10-29 DIAGNOSIS — E785 Hyperlipidemia, unspecified: Secondary | ICD-10-CM

## 2019-10-29 DIAGNOSIS — M109 Gout, unspecified: Secondary | ICD-10-CM

## 2019-10-29 MED ORDER — GABAPENTIN 300 MG PO CAPS: 300.0000 mg | ORAL_CAPSULE | Freq: Every day | ORAL | Status: DC

## 2019-10-29 NOTE — Progress Notes (Signed)
This service is provided via telemedicine  No vital signs collected/recorded due to the encounter was a telemedicine visit.   Location of patient (ex: home, work): Home    Patient consents to a telephone visit:  Yes  Location of the provider (ex: office, home):  [facility]  Name of any referring provider:  N/A  Names of all persons participating in the telemedicine service and their role in the encounter: Patient, Jacob [name], RMA, Sherrie Mustache, NP.   Time spent on call:  15 minutes with Medical Assistant.      Careteam: Patient Care Team: [name], NP as PCP - General (Geriatric Medicine) [name], MD as PCP - Infectious Diseases (Infectious Diseases) [name], MD as Consulting Physician (Ophthalmology) [name], MD as Consulting Physician (Cardiology) [name], MD as Consulting Physician (Infectious Diseases) Gardiner Barefoot, DPM as Consulting Physician (Podiatry) [name], Nelda Bucks, NP as Nurse Practitioner (Family Medicine) [place], [name]  Advanced Directive information Does Patient Have a Medical Advance Directive?: Yes, Type of Advance Directive: Living will, Does patient want to make changes to medical advance directive?: No - Patient declined  Allergies  Allergen Reactions  . Augmentin [Amoxicillin-Pot Clavulanate] Diarrhea    Severe  . Amphetamines Other (See Comments)    Unknown reaction type    Chief Complaint  Patient presents with  . Medical Management of Chronic Issues    4 Month Follow Up/ Telehealth     HPI: Patient is a 69 y.o. male for routine follow up via telephone visit.  Reports he has had a blood when he wiped. He went to the ED where he was stable and no indications for an infusion. Last hgb 8.8   DM- using insulin as needed, taking toujeo 88 units as needed if he feels like his blood sugar is high along with humalog 20 units.  Blood sugars in the 1 "something" sometimes over 200.  No  hypoglycemia. Not taking toujeo if blood sugar is <150 and not taking before dialysis per nephrologist.    He is off gabapentin due to CKD- neuropathy is bad.   CAD/CHF- continues to follow up with cardiologist, ongoing shortness of breath- denies chest pains at this time.   Gout- does not need colchicine, continues on allopurinol.   HIV- continues to follow up with ID and on biktarvy  GERD- controlled on protonix  Hyperlipidemia- continues on lipitor  Ongoing callus to feet and being followed by podiatry. No active wounds.   osa screening of 5, pt declines further work up.   Review of Systems:  Review of Systems  Constitutional: Negative for chills, fever and weight loss.  Respiratory: Positive for shortness of breath. Negative for cough and sputum production.   Cardiovascular: Positive for leg swelling (mild). Negative for chest pain and palpitations.  Gastrointestinal: Negative for abdominal pain, constipation, diarrhea and heartburn.  Genitourinary: Negative for dysuria, frequency and urgency.  Musculoskeletal: Negative for back pain, falls, joint pain and myalgias.  Skin: Negative.   Neurological: Negative for dizziness and headaches.  Psychiatric/Behavioral: Negative for depression. The patient does not have insomnia.     Past Medical History:  Diagnosis Date  . Acute respiratory failure (Uncertain) 03/01/2018  . Arthritis    "all over; mostly knees and back" (02/28/2018)  . CHF (congestive heart failure) (HCC)    not on any meds  . Chronic lower back pain    stenosis  . Community acquired pneumonia 09/06/2013  . COPD (chronic obstructive pulmonary disease) (  [place])   . Coronary atherosclerosis of native coronary artery 2005   s/p surgery  . Drug abuse ([place])    hx; tested for cocaine as recently as 2/08. says he is not using drugs now - avoided defib. for this reason   . ESRD (end stage renal disease) ([place])    Hemo M-W-F- Richarda Blade  . GERD (gastroesophageal reflux disease)      takes OTC meds as needed  . GI bleeding 02/06/2019  . Glaucoma    uses eye drops daily  . Hepatitis B 1968   "tx'd w/isolation; caught it from toilet stools in gym"  . History of blood transfusion 03/01/2019  . History of colon polyps    benign  . History of gout    takes Allopurinol daily as well as Colchicine-if needed (02/28/2018)  . History of kidney stones   . HTN (hypertension)    takes Coreg,Imdur.and Apresoline daily  . Human immunodeficiency virus (HIV) disease ([place]) dx'd 1995   takes Genvoya daily  . Hyperlipidemia    takes Atorvastatin daily  . Ischemic cardiomyopathy   . Muscle spasm    takes Zanaflex as needed  . Myocardial infarction ([place]) ~ 2004/2005  . Nocturia   . Peripheral neuropathy    takes gabapentin daily  . Pneumonia    "at least twice" (02/28/2018)  . Shortness of breath dyspnea    rarely but if notices it then with exertion  . Syphilis, unspecified   . Type II diabetes mellitus ([place]) 2004   Lantus daily.Average fasting blood sugar 125-199  . Wears glasses   . Wears partial dentures    Past Surgical History:  Procedure Laterality Date  . AV FISTULA PLACEMENT Left 08/02/2018   Procedure: ARTERIOVENOUS (AV) FISTULA CREATION  left arm radiocephlic;  Surgeon: Marty Heck, MD;  Location: [place];  Service: Vascular;  Laterality: Left;  . AV FISTULA PLACEMENT Left 08/01/2019   Procedure: LEFT BRACHIOCEPHALIC ARTERIOVENOUS (AV) FISTULA CREATION;  Surgeon: [name], MD;  Location: [place];  Service: Vascular;  Laterality: Left;  . BASCILIC VEIN TRANSPOSITION Left 10/03/2019   Procedure: BASILIC VEIN TRANSPOSITION LEFT SECOND STAGE;  Surgeon: [name], MD;  Location: [place];  Service: Vascular;  Laterality: Left;  . CARDIAC CATHETERIZATION  10/2002; 12/19/2004   [name] 03/08/2011  . COLONOSCOPY  2013   Dr.John Henrene Pastor   . CORONARY ARTERY BYPASS GRAFT  02/24/2003   CABG X2/notes 03/08/2011  . ESOPHAGOGASTRODUODENOSCOPY (EGD) WITH PROPOFOL N/A  02/08/2019   Procedure: ESOPHAGOGASTRODUODENOSCOPY (EGD) WITH PROPOFOL;  Surgeon: Milus Banister, MD;  Location: [place];  Service: Gastroenterology;  Laterality: N/A;  . HOT HEMOSTASIS N/A 02/08/2019   Procedure: HOT HEMOSTASIS (ARGON PLASMA COAGULATION/BICAP);  Surgeon: Milus Banister, MD;  Location: [facility] ENDOSCOPY;  Service: Gastroenterology;  Laterality: N/A;  . INTERTROCHANTERIC HIP FRACTURE SURGERY Left 11/2006   [name] 03/08/2011  . IR FLUORO GUIDE CV LINE RIGHT  07/24/2019  . IR FLUORO GUIDE CV LINE RIGHT  07/30/2019  . IR US GUIDE VASC ACCESS RIGHT  07/24/2019  . IR US GUIDE VASC ACCESS RIGHT  07/30/2019  . LAPAROSCOPIC CHOLECYSTECTOMY  05/2006  . LIGATION OF COMPETING BRANCHES OF ARTERIOVENOUS FISTULA Left 11/05/2018   Procedure: LIGATION OF COMPETING BRANCHES OF ARTERIOVENOUS FISTULA  LEFT  ARM;  Surgeon: Marty Heck, MD;  Location: [place];  Service: Vascular;  Laterality: Left;  . LUMBAR LAMINECTOMY/DECOMPRESSION MICRODISCECTOMY N/A 02/29/2016   Procedure: Left L4-5 Lateral Recess Decompression, Removal Extradural Intraspinal Facet  Cyst;  Surgeon: Marybelle Killings, MD;  Location: [place];  Service: Orthopedics;  Laterality: N/A;  . MULTIPLE TOOTH EXTRACTIONS    . ORIF MANDIBULAR FRACTURE Left 08/13/2004   ORIF of left body fracture mandible with KLS Martin 2.3-mm six hole/notes 03/08/2011   Social History:   reports that he has been smoking cigarettes. He has a 21.50 pack-year smoking history. He has never used smokeless tobacco. He reports current alcohol use of about 12.0 standard drinks of alcohol per week. He reports that he does not use drugs.  Family History  Problem Relation Age of Onset  . Heart failure Father   . Hypertension Father   . Diabetes Brother   . Heart attack Brother   . Alzheimer's disease Mother   . Stroke Sister   . Diabetes Sister   . Alzheimer's disease Sister   . Hypertension Brother   . Diabetes Brother   . Drug abuse Brother   . Colon cancer  Neg Hx     Medications: Patient's Medications  New Prescriptions   No medications on file  Previous Medications   ACETAMINOPHEN (TYLENOL 8 HOUR) 650 MG CR TABLET    Take 1,300 mg by mouth every 8 (eight) hours as needed for pain.    ALLOPURINOL (ZYLOPRIM) 100 MG TABLET    TAKE 1 TABLET(100 MG) BY MOUTH DAILY   ASPIRIN EC 81 MG TABLET    Take 81 mg by mouth daily.   ATORVASTATIN (LIPITOR) 80 MG TABLET    Take 1 tablet (80 mg total) by mouth daily.   B-D UF III MINI PEN NEEDLES 31G X 5 MM MISC    USE FOUR TIMES DAILY   BIKTARVY 50-200-25 MG TABS TABLET    TAKE 1 TABLET BY MOUTH DAILY   CALCITRIOL (ROCALTROL) 0.25 MCG CAPSULE    Take 0.25 mcg by mouth daily.    CHOLINE FENOFIBRATE (FENOFIBRIC ACID) 135 MG CPDR    TAKE 1 CAPSULE BY MOUTH DAILY   COLCHICINE 0.6 MG TABLET    Take 0.6 mg by mouth daily as needed (pain).   DICLOFENAC SODIUM (VOLTAREN) 1 % GEL    Apply 1 application topically 4 (four) times daily as needed for pain.   GABAPENTIN (NEURONTIN) 300 MG CAPSULE    Take 300 mg by mouth daily.    GLUCOSE BLOOD TEST STRIP    Use to test blood sugar three times daily. E11.40   INSULIN LISPRO (HUMALOG) 100 UNIT/ML INJECTION    Inject 20 Units into the skin daily as needed for high blood sugar (above 150).    ISOSORBIDE MONONITRATE (IMDUR) 30 MG 24 HR TABLET    TAKE 1 TABLET(30 MG) BY MOUTH DAILY   LANCETS (ONETOUCH DELICA PLUS [id number]) MISC    Inject 1 Device as directed 3 (three) times daily. Dx: E11.40   LATANOPROST (XALATAN) 0.005 % OPHTHALMIC SOLUTION    Place 1 drop into both eyes at bedtime.   MULTIVITAMIN (RENA-VIT) TABS TABLET    Take 1 tablet by mouth daily.   NITROGLYCERIN (NITROSTAT) 0.3 MG SL TABLET    ONE TABLET UNDER TONGUE AS NEEDED FOR CHEST PAIN   PANTOPRAZOLE (PROTONIX) 40 MG TABLET    Take 1 tablet (40 mg total) by mouth daily.   SEVELAMER CARBONATE (RENVELA) 800 MG TABLET    Take 800 mg by mouth 3 (three) times daily with meals.   SIMETHICONE (MYLICON) 80 MG CHEWABLE  TABLET    Chew 1 tablet (80 mg total) by mouth 4 (  four) times daily as needed for flatulence.   TIMOLOL (TIMOPTIC) 0.5 % OPHTHALMIC SOLUTION    Place 1 drop into both eyes every morning.   UMECLIDINIUM-VILANTEROL (ANORO ELLIPTA) 62.5-25 MCG/INH AEPB    INHALE 1 PUFF BY MOUTH EVERY DAY  Modified Medications   No medications on file  Discontinued Medications   CARVEDILOL (COREG) 25 MG TABLET    Take 1 tablet (25 mg total) by mouth 2 (two) times daily with a meal.   HYDROCODONE-ACETAMINOPHEN (NORCO) 5-325 MG TABLET    Take 1 tablet by mouth every 6 (six) hours as needed for moderate pain.    Physical Exam:  Vitals:   There is no height or weight on file to calculate BMI. Wt Readings from Last 3 Encounters:  10/21/19 191 lb (86.6 kg)  10/10/19 191 lb (86.6 kg)  10/03/19 191 lb (86.6 kg)     Labs reviewed: Basic Metabolic Panel: Recent Labs    07/21/19 0928 07/21/19 0943 07/30/19 1219 07/31/19 0318 08/01/19 0325 10/03/19 0926 10/21/19 1035  NA 140  --  132* 131* 132* 136 133*  K 4.5  --  3.9 4.1 3.8 3.3* 3.5  CL 108  --  96* 96* 95* 94* 93*  CO2 23   < > 19* 18* 20*  --  26  GLUCOSE 110*  --  155* 202* 205* 120* 210*  BUN 72*  --  99* 117* 65* 28* 55*  CREATININE 5.55*  --  7.39* 7.77* 5.73* 4.20* 7.06*  CALCIUM 9.0   < > 8.4* 8.4* 8.5*  --  8.9  MG 1.8  --   --   --   --   --   --   PHOS 5.1*   < > 7.5* 8.2* 6.5*  --   --    < > = values in this interval not displayed.   Liver Function Tests: Recent Labs    07/21/19 0928 07/23/19 0608 07/31/19 0318 08/01/19 0325 10/21/19 1035  AST 95* 47*  --   --  177*  ALT 51* 34  --   --  214*  ALKPHOS 42 43  --   --  86  BILITOT 0.7 1.2  --   --  1.0  PROT 7.7 7.6  --   --  7.4  ALBUMIN 3.2* 3.0* 2.0* 2.2* 3.3*   Recent Labs    02/06/19 1045  LIPASE 110*   No results for input(s): AMMONIA in the last 8760 hours. CBC: Recent Labs    02/06/19 1045 02/06/19 1817 07/21/19 0928 07/21/19 0943 07/29/19 0357  07/31/19 0318 08/01/19 0325 10/03/19 0926 10/09/19 0000 10/16/19 0000 10/21/19 1035  WBC 5.8   < > 7.2   < > 6.7 7.0 7.8  --   --   --  5.4  NEUTROABS 2.7  --  5.6  --  4.4  --   --   --   --   --   --   HGB 6.4*  --  9.7*  --  8.2* 8.8* 9.4* 10.9* 8.2* 7.4* 8.1*  HCT 20.3*  --  28.9*  --  24.4* 25.8* 27.3* 32.0*  --   --  26.5*  MCV 96.2   < > 100.3*   < > 96.1 93.8 92.2  --   --   --  101.1*  PLT 199   < > 200   < > 205 284 298  --   --   --  230   < > =  values in this interval not displayed.   Lipid Panel: Recent Labs    11/29/18 0933 03/08/19 0000 06/04/19 1019  CHOL 183 115  115 158  HDL 33* 18*  18* 19*  LDLCALC 114*  --   --   TRIG 240* 674*  674* 574*  CHOLHDL 5.5*  --  8.3*   TSH: No results for input(s): TSH in the last 8760 hours. A1C: Lab Results  Component Value Date   HGBA1C 9.2 (H) 06/04/2019     Assessment/Plan 1. ESRD (end stage renal disease) on dialysis [name]) Ongoing follow up with vascular with HD Monday, Wednesday and Friday.  Continues on calcitriol, renvela per nephrology  2. Type 2 diabetes mellitus with diabetic neuropathy, with long-term current use of insulin ([place]) - pt does not have exact blood sugar readings today during the visit however denies hypoglycemia- reports blood sugars overall much better improved since starting HD and as advised to hold toujeo on days he goes to dialysis and not to take for blood sugar <150 in the evening.  -will get Hgb A1c through blood work from dialysis, may need sooner follow up based on this.   3. Essential hypertension -does not check blood pressure at home. Recent bp in epic stable. Getting checked at diaylsis.   4. Anemia of chronic disease Stable on recent labs, being monitored closely due to ESRD and now with some bleeding from rectum.   5. Coronary artery disease involving native coronary artery of native heart without angina pectoris Without chest pains, stable on imdur 30 mg CABG in 2004,  cath recently avoided due to ESRD, continues on ASA continues to follow up with cardiology.   6. Hyperlipidemia associated with type 2 diabetes mellitus ([place]) -will have dialysis get lipid panel with next labs, continues on Lipitor.   7. Gout, unspecified cause, unspecified chronicity, unspecified site Continues on allopurinol 100 mg daily, no recent flare.  8. Chronic obstructive pulmonary disease, unspecified COPD type ([place]) -ongoing shortness of breath which is likely multifactoral due to anemia, chf, and COPD. Without increase in cough or congestion. No wheezing or sputum production at this time. Continues on anoro.   9. Chronic combined systolic and diastolic congestive heart failure ([place]) -pt with chronic shortness of breath which is likely multifactorial. No acute changes in swelling, chest pains or weight gain. Continues on HD M,W, F   10. Rectal bleeding Was seen in the ED and hgb was stable. Instructed to follow up with GI as outpt, has appt scheduled on 1/14, he has had some bleeding noted on tissue when he wipes but not a large amount of blood noted.  Next appt: 3 months with dinah, sooner if needed  Kaylanie Capili K. Harle Battiest  [facility] [phone]    Virtual Visit via Telephone Note  I connected with pt on 10/29/19 at  9:30 AM EST by telephone and verified that I am speaking with the correct person using two identifiers.  Location: Patient: home Provider: [facility] clinic    I discussed the limitations, risks, security and privacy concerns of performing an evaluation and management service by telephone and the availability of in person appointments. I also discussed with the patient that there may be a patient responsible charge related to this service. The patient expressed understanding and agreed to proceed.   I discussed the assessment and treatment plan with the patient. The patient was provided an opportunity to ask questions and all  were answered. The  patient agreed with the plan and demonstrated an understanding of the instructions.   The patient was advised to call back or seek an in-person evaluation if the symptoms worsen or if the condition fails to improve as anticipated.  I provided 22 minutes of non-face-to-face time during this encounter.  Jacob [name]. Harle Battiest Avs printed and mailed

## 2019-10-29 NOTE — Progress Notes (Signed)
POST OPERATIVE OFFICE NOTE    CC:  F/u for surgery  HPI:  This is a 69 y.o. male who is s/p second stage left upper arm basilic vein transposition.  He is currently without hand pain or numbness.  He dialyzes next-door on Mondays Wednesdays and Fridays via right IJ tunneled dialysis catheter.  He reports no issues with his catheter function  Allergies  Allergen Reactions  . Augmentin [Amoxicillin-Pot Clavulanate] Diarrhea    Severe  . Amphetamines Other (See Comments)    Unknown reaction type    Current Outpatient Medications  Medication Sig Dispense Refill  . acetaminophen (TYLENOL 8 HOUR) 650 MG CR tablet Take 1,300 mg by mouth every 8 (eight) hours as needed for pain.     [name] allopurinol (ZYLOPRIM) 100 MG tablet TAKE 1 TABLET(100 MG) BY MOUTH DAILY 90 tablet 1  . aspirin EC 81 MG tablet Take 81 mg by mouth daily.    [name] atorvastatin (LIPITOR) 80 MG tablet Take 1 tablet (80 mg total) by mouth daily. 90 tablet 3  . B-D UF III MINI PEN NEEDLES 31G X 5 MM MISC USE FOUR TIMES DAILY 100 each 11  . BIKTARVY 50-200-25 MG TABS tablet TAKE 1 TABLET BY MOUTH DAILY 30 tablet 5  . calcitRIOL (ROCALTROL) 0.25 MCG capsule Take 0.25 mcg by mouth daily.     . Choline Fenofibrate (FENOFIBRIC ACID) 135 MG CPDR TAKE 1 CAPSULE BY MOUTH DAILY 90 capsule 1  . colchicine 0.6 MG tablet Take 0.6 mg by mouth daily as needed (pain).    [name] diclofenac sodium (VOLTAREN) 1 % GEL Apply 1 application topically 4 (four) times daily as needed for pain.    [name] gabapentin (NEURONTIN) 300 MG capsule Take 1 capsule (300 mg total) by mouth daily.    [name] glucose blood test strip Use to test blood sugar three times daily. E11.40 300 each 3  . insulin lispro (HUMALOG) 100 UNIT/ML injection Inject 20 Units into the skin daily as needed for high blood sugar (above 150).     . isosorbide mononitrate (IMDUR) 30 MG 24 hr tablet TAKE 1 TABLET(30 MG) BY MOUTH DAILY 90 tablet 1  . Lancets (ONETOUCH DELICA PLUS [id number]) MISC Inject 1  Device as directed 3 (three) times daily. Dx: E11.40 300 each 3  . latanoprost (XALATAN) 0.005 % ophthalmic solution Place 1 drop into both eyes at bedtime.    . multivitamin (RENA-VIT) TABS tablet Take 1 tablet by mouth daily.    . nitroGLYCERIN (NITROSTAT) 0.3 MG SL tablet ONE TABLET UNDER TONGUE AS NEEDED FOR CHEST PAIN 25 tablet 4  . pantoprazole (PROTONIX) 40 MG tablet Take 1 tablet (40 mg total) by mouth daily. 90 tablet 1  . sevelamer carbonate (RENVELA) 800 MG tablet Take 800 mg by mouth 3 (three) times daily with meals.    . simethicone (MYLICON) 80 MG chewable tablet Chew 1 tablet (80 mg total) by mouth 4 (four) times daily as needed for flatulence. 90 tablet 3  . timolol (TIMOPTIC) 0.5 % ophthalmic solution Place 1 drop into both eyes every morning.    . umeclidinium-vilanterol (ANORO ELLIPTA) 62.5-25 MCG/INH AEPB INHALE 1 PUFF BY MOUTH EVERY DAY 60 each 5   No current facility-administered medications for this visit.     ROS:  See HPI  Physical Exam:  DIALYSIS ACCESS  Reason for Exam: Routine follow up.  Access Site: Left Upper Extremity.  Access Type: Basilic vein transposition.  History: AVF placed on 10/03/2019.  Performing Technologist:  Delorise Shiner RVT    Examination Guidelines: A complete evaluation includes B-mode imaging, spectral Doppler, color Doppler, and power Doppler as needed of all accessible portions of each vessel. Unilateral testing is considered an integral part of a complete examination. Limited examinations for reoccurring indications may be performed as noted.    Findings: +--------------------+----------+-----------------+--------+ AVF                 PSV (cm/s)Flow Vol (mL/min)Comments +--------------------+----------+-----------------+--------+ Native artery inflow   216          1814                +--------------------+----------+-----------------+--------+ AVF Anastomosis        764                               +--------------------+----------+-----------------+--------+    +------------+----------+-------------+----------+--------+ OUTFLOW VEINPSV (cm/s)Diameter (cm)Depth (cm)Describe +------------+----------+-------------+----------+--------+ Prox UA         62        1.28        1.26            +------------+----------+-------------+----------+--------+ Mid UA         147        1.00        0.65            +------------+----------+-------------+----------+--------+ Dist UA        116        0.68        0.25            +------------+----------+-------------+----------+--------+ AC Fossa       189        0.70        0.28            +------------+----------+-------------+----------+--------+       Summary: Patent arteriovenous fistula.  *See table(s) above for measurements and observations.   Diagnosing physician: Curt Jews MD    ------------------------------------------------------------------------------     Preliminary    Incision: His left upper arm incisions continue to heal without signs of infection. Extremities: Palpable radial pulse.  (Thrill detected from prior radiocephalic fistula.) 5/5 right grip strength.  Good thrill in fistula.  His fistula is visible and palpable along its course of the upper arm. Neuro: Alert and oriented x 4  Assessment/Plan:  This is a 69 y.o. male who is s/p: Left upper extremity basilic vein transposition 10/03/2019 by Dr. Donnetta Hutching.  Fistula is mature and at an appropriate depth and diameter.  Okay to begin dialysis access.  Once access via fistula is progressing without complications, removal of tunneled dialysis catheter per nephrology.  Jannet Mantis, PA-C [facility] [phone]  Clinic MD: Carlis Abbott

## 2019-11-07 ENCOUNTER — Ambulatory Visit (INDEPENDENT_AMBULATORY_CARE_PROVIDER_SITE_OTHER): Payer: Medicare Other | Admitting: Nurse Practitioner

## 2019-11-07 ENCOUNTER — Encounter: Payer: Self-pay | Admitting: Nurse Practitioner

## 2019-11-07 ENCOUNTER — Telehealth: Payer: Self-pay | Admitting: Cardiovascular Disease

## 2019-11-07 ENCOUNTER — Other Ambulatory Visit (INDEPENDENT_AMBULATORY_CARE_PROVIDER_SITE_OTHER): Payer: Medicare Other

## 2019-11-07 VITALS — BP 130/70 | HR 99 | Temp 98.0°F | Ht 72.0 in | Wt 189.0 lb

## 2019-11-07 DIAGNOSIS — K5900 Constipation, unspecified: Secondary | ICD-10-CM | POA: Diagnosis not present

## 2019-11-07 DIAGNOSIS — D649 Anemia, unspecified: Secondary | ICD-10-CM

## 2019-11-07 DIAGNOSIS — K602 Anal fissure, unspecified: Secondary | ICD-10-CM

## 2019-11-07 LAB — CBC
HCT: 26.1 % — ABNORMAL LOW (ref 39.0–52.0)
Hemoglobin: 8.2 g/dL — ABNORMAL LOW (ref 13.0–17.0)
MCHC: 31.5 g/dL (ref 30.0–36.0)
MCV: 93.6 fl (ref 78.0–100.0)
Platelets: 131 10*3/uL — ABNORMAL LOW (ref 150.0–400.0)
RBC: 2.78 Mil/uL — ABNORMAL LOW (ref 4.22–5.81)
RDW: 16.9 % — ABNORMAL HIGH (ref 11.5–15.5)
WBC: 3.8 10*3/uL — ABNORMAL LOW (ref 4.0–10.5)

## 2019-11-07 MED ORDER — DILTIAZEM GEL 2 %: 1.0000 "application " | Freq: Three times a day (TID) | CUTANEOUS | 1 refills | Status: DC

## 2019-11-07 NOTE — Progress Notes (Signed)
Primary GI: Jacob Shorts, MD Chief Complaint: rectal bleeding      IMPRESSION and PLAN:    69 yo male with HIV, ESRD on HD (Mon,Wed,Fri), HTN, DM, CAD, ? COPD, chronic anemia    1. Rectal pain and bleeding, chronic and intermittent. Anal fissure on exam. Due to discomfort DRE not possible. --Treat constipation, see #2. --Topical diltiazem.  Apply 3 times daily as directed x 6 weeks --Follow-up with me in 6 weeks time.  If anal fissures have healed (and Cardiology evaluation okay then will get him scheduled for screening colonoscopy ( last one ~ 10 years ago).   2. Constipation with hard stool.  --begin two stool softeners daily at bedtime --On fluid restriction due to dialysis.  He does not consume the full amount allowed.  I encouraged as many glasses of water daily as allowed by nephrologist --Start daily fiber supplement  3. Chronic anemia requiring periodic iron infusions and blood transfusions.  Inpatient EGD for melena and acute on chronic anemia in April 2020 with findings of nonbleeding gastric and intestinal AVMs, status post ablation .  Colonoscopy recommended at that time .  Patient says he was advised by cardiology to wait on colonoscopy until initiation of dialysis which he started in October .  -Patient has been having some shortness of breath, he is due to see cardiology soon.  I will see him back for evaluation of anal fissures and if dyspnea improves/cardiology work-up okay then get him scheduled for colonoscopy  4. Dyspnea,  Started in October at time dialysis initiated. Secondary to worsening anemia vrs cardiopulmonary (CXR for dyspnea on 10/21/2019 suggests mild interstitial edema --cbc to make sure hgb stable.   HPI:     Patient is a 69 year old male last seen by [place] during hospital admission in April 2020 for anemia and melena.  He has known history of gastric/intestinal AVMs treated on inpatient EGD at the time.  Colonoscopy was recommended, patient declined.   He said cardiology felt like he needed to start dialysis first.   Patient has continued to have intermittent bleeding for months. Blood is bright red, usually on the tissue with wiping. No black stools.  He has rectal discomfort with bowel movements. He describes stools which are hard and difficult to pass.  Recently purchased a bottle of stool softeners and wanted to know if it was okay to start them.  Due to end-stage renal disease patient is fluid restriction but says he does not usually meet the allowed amount/ No abdominal pain. Other than constipation and rectal bleeding he has no GI complaints. His main complaint is shortness of breath. Breathing okay at rest and when lying flat but has dyspnea when walking. He is awaiting visit with Cardiology.    Baseline hgb ~ 9. He has required period transfusions over the last several months.  Tranfused a unit of blood on 07/26/19 after hgb declined to 7.6. Dialysis monitors his hgb, gave him IV iron yesterday   Data Reviewed:   Hgb 8.1, MCV 101, platelets 230 AST 177, ALT 214, Alk phos 86, Tbili 1.0  (normal transaminases Sept 2020) FOBT + weeks ago  Previous GI studies:  01/29/19 EGD for melena and acute on chronic anemia Impression:  There were a few small AVMs in the stomach and duodenal bulb (4-5 total). I used APC to destroy of the AVMs and none bled during treatment. Mild duodenitis. It is doubtful to me that the AVMs noted above are responsible for  his melena and acute on chronic anemia and so I recommended that he have a colonoscopy tomorrow however he adamantly declined    Review of systems:     No chest pain, no SOB, no fevers, no urinary sx   Past Medical History:  Diagnosis Date  . Acute respiratory failure (HCC) 03/01/2018  . Arthritis    "all over; mostly knees and back" (02/28/2018)  . CHF (congestive heart failure) (HCC)    not on any meds  . Chronic lower back pain    stenosis  . Community acquired pneumonia 09/06/2013  .  COPD (chronic obstructive pulmonary disease) (HCC)   . Coronary atherosclerosis of native coronary artery 2005   s/p surgery  . Drug abuse (HCC)    hx; tested for cocaine as recently as 2/08. says he is not using drugs now - avoided defib. for this reason   . ESRD (end stage renal disease) (HCC)    [facility] M-W-F- [address]  . GERD (gastroesophageal reflux disease)    takes OTC meds as needed  . GI bleeding 02/06/2019  . Glaucoma    uses eye drops daily  . Hepatitis B 1968   "tx'd w/isolation; caught it from toilet stools in gym"  . History of blood transfusion 03/01/2019  . History of colon polyps    benign  . History of gout    takes Allopurinol daily as well as Colchicine-if needed (02/28/2018)  . History of kidney stones   . HTN (hypertension)    takes Coreg,Imdur.and Apresoline daily  . Human immunodeficiency virus (HIV) disease (HCC) dx'd 1995   takes Genvoya daily  . Hyperlipidemia    takes Atorvastatin daily  . Ischemic cardiomyopathy   . Muscle spasm    takes Zanaflex as needed  . Myocardial infarction (HCC) ~ 2004/2005  . Nocturia   . Peripheral neuropathy    takes gabapentin daily  . Pneumonia    rarely but if notices it then with exertion  . Syphilis, unspecified   . Type II diabetes mellitus (HCC) 2004   Lantus daily.Average fasting blood sugar 125-199  . Wears glasses   . Wears partial dentures     Patient's surgical history, family medical history, social history, medications and allergies were all reviewed in Epic   Serum creatinine: 7.06 mg/dL (H) 10/21/19 1035 Estimated creatinine clearance: 11.3 mL/min (A)  Current Outpatient Medications  Medication Sig Dispense Refill  . acetaminophen (TYLENOL 8 HOUR) 650 MG CR tablet Take 1,300 mg by mouth every 8 (eight) hours as needed for pain.     . allopurinol (ZYLOPRIM) 100 MG tablet TAKE 1 TABLET(100 MG) BY MOUTH DAILY 90 tablet 1  . aspirin EC 81 MG tablet Take 81 mg by mouth daily.    . atorvastatin (LIPITOR)  80 MG tablet Take 1 tablet (80 mg total) by mouth daily. 90 tablet 3  . B-D UF III MINI PEN NEEDLES 31G X 5 MM MISC USE FOUR TIMES DAILY 100 each 11  . BIKTARVY 50-200-25 MG TABS tablet TAKE 1 TABLET BY MOUTH DAILY 30 tablet 5  . calcitRIOL (ROCALTROL) 0.25 MCG capsule Take 0.25 mcg by mouth daily.     . Choline Fenofibrate (FENOFIBRIC ACID) 135 MG CPDR TAKE 1 CAPSULE BY MOUTH DAILY 90 capsule 1  . colchicine 0.6 MG tablet Take 0.6 mg by mouth daily as needed (pain).    . diclofenac sodium (VOLTAREN) 1 % GEL Apply 1 application topically 4 (four) times daily as needed for pain.    .   gabapentin (NEURONTIN) 300 MG capsule Take 1 capsule (300 mg total) by mouth daily.    . glucose blood test strip Use to test blood sugar three times daily. E11.40 300 each 3  . insulin lispro (HUMALOG) 100 UNIT/ML injection Inject 20 Units into the skin daily as needed for high blood sugar (above 150).     . isosorbide mononitrate (IMDUR) 30 MG 24 hr tablet TAKE 1 TABLET(30 MG) BY MOUTH DAILY 90 tablet 1  . Lancets (ONETOUCH DELICA PLUS LANCET33G) MISC Inject 1 Device as directed 3 (three) times daily. Dx: E11.40 300 each 3  . latanoprost (XALATAN) 0.005 % ophthalmic solution Place 1 drop into both eyes at bedtime.    . multivitamin (RENA-VIT) TABS tablet Take 1 tablet by mouth daily.    . nitroGLYCERIN (NITROSTAT) 0.3 MG SL tablet ONE TABLET UNDER TONGUE AS NEEDED FOR CHEST PAIN 25 tablet 4  . pantoprazole (PROTONIX) 40 MG tablet Take 1 tablet (40 mg total) by mouth daily. 90 tablet 1  . sevelamer carbonate (RENVELA) 800 MG tablet Take 800 mg by mouth 3 (three) times daily with meals.    . simethicone (MYLICON) 80 MG chewable tablet Chew 1 tablet (80 mg total) by mouth 4 (four) times daily as needed for flatulence. 90 tablet 3  . timolol (TIMOPTIC) 0.5 % ophthalmic solution Place 1 drop into both eyes every morning.    . umeclidinium-vilanterol (ANORO ELLIPTA) 62.5-25 MCG/INH AEPB INHALE 1 PUFF BY MOUTH EVERY DAY 60  each 5   No current facility-administered medications for this visit.    Physical Exam:     There were no vitals taken for this visit.  GENERAL:  Pleasant male in NAD PSYCH: : Cooperative, normal affect EENT:  conjunctiva pink, mucous membranes moist, neck supple without masses CARDIAC:  RRR,  no peripheral edema PULM: Normal respiratory effort, Crackles throughout both lungs ABDOMEN:  Nondistended, soft, nontender. No obvious masses, no hepatomegaly,  normal bowel sounds SKIN:  turgor, no lesions seen Musculoskeletal:  Normal muscle tone, normal strength NEURO: Alert and oriented x 3, no focal neurologic deficits     , NP 11/07/2019, 1:20 PM  

## 2019-11-07 NOTE — Telephone Encounter (Signed)
Pt c/o Shortness Of Breath: STAT if SOB developed within the last 24 hours or pt is noticeably SOB on the phone  1. Are you currently SOB (can you hear that pt is SOB on the phone)? Yes  2. How long have you been experiencing SOB? When patient has Dialysis   3. Are you SOB when sitting or when up moving around? walking  4. Are you currently experiencing any other symptoms? No

## 2019-11-07 NOTE — Patient Instructions (Addendum)
If you are age 71 or older, your body mass index should be between 23-30. Your Body mass index is 25.63 kg/m. If this is out of the aforementioned range listed, please consider follow up with your Primary Care Provider.  If you are age 53 or younger, your body mass index should be between 19-25. Your Body mass index is 25.63 kg/m. If this is out of the aformentioned range listed, please consider follow up with your Primary Care Provider.   Your provider has requested that you go to the basement level for lab work before leaving today. Press "B" on the elevator. The lab is located at the first door on the left as you exit the elevator. CBC  We have sent the following medications to your pharmacy for you to pick up at your convenience: Diltiazem Gel - this has been sent to [facility] [place] as needed.  (this is over-the-counter)  Use stool softeners two at bedtime.  Start Fiber daily. ie Benefiber, Metamucil etc.  Follow up with me on Feb. 25, 2021 at 1:30 pm.    Thank you for choosing me and [facility].   [name] Savoy, NP

## 2019-11-07 NOTE — Progress Notes (Signed)
Assessment and plan for this complex patient reviewed.

## 2019-11-07 NOTE — Telephone Encounter (Signed)
Call transferred to triage for SOB. Pt has been SOB since a hospitalization in Sept/Oct 20. He started dialysis at that hospitalization and goes now every MWF. His SOB is when he walks a short distance.  This improves with rest.  There is no chest pain. He was given oxygen at dialysis.  He said that at last ov Dr. [name] wanted to do a cath but could not because he hadn't started dialysis. He has an appointment in March but would like to be seen sooner if there is any opening on a Tue or Thurs.  He declined to see an APP.   Of note, he is having rectal bleeding and seeing a doctor today for this. He has been advised that if SOB does not improve with rest or if he develops chest pain, he needs to call EMS for eval at ED

## 2019-11-07 NOTE — Telephone Encounter (Signed)
Can try to work in on my schedule

## 2019-11-08 NOTE — Telephone Encounter (Signed)
Called patient and schedule an appointment on 12/12/19 with Dr. [name].

## 2019-11-09 LAB — BASIC METABOLIC PANEL
BUN: 61 — AB (ref 4–21)
Chloride: 98 — AB (ref 99–108)
Creatinine: 6.3 — AB (ref 0.6–1.3)
Glucose: 134
Potassium: 4.6 (ref 3.4–5.3)
Sodium: 137 (ref 137–147)

## 2019-11-09 LAB — IRON,TIBC AND FERRITIN PANEL
%SAT: 7
Ferritin: 125
Iron: 34
TIBC: 480
UIBC: 446

## 2019-11-09 LAB — COMPREHENSIVE METABOLIC PANEL
Albumin: 5 (ref 3.5–5.0)
Calcium: 9.1 (ref 8.7–10.7)

## 2019-11-09 LAB — CBC AND DIFFERENTIAL
HCT: 27 — AB (ref 41–53)
Hemoglobin: 7.9 — AB (ref 13.5–17.5)
Platelets: 150 (ref 150–399)
WBC: 3.3

## 2019-11-09 LAB — HEPATIC FUNCTION PANEL: Alkaline Phosphatase: 74 (ref 25–125)

## 2019-11-09 LAB — CBC: RBC: 2.78 — AB (ref 3.87–5.11)

## 2019-11-09 LAB — HEMOGLOBIN A1C: Hemoglobin A1C: 5

## 2019-11-11 ENCOUNTER — Telehealth: Payer: Self-pay | Admitting: Nurse Practitioner

## 2019-11-11 NOTE — Telephone Encounter (Signed)
Spoke with the patient. He is reporting blood on the tissue today after his bowel movement. He had not previously seen any blood. Bowel movements are loose. They are not painful. He is not having any pain. He is using lidocaine gel around "the opening."  He wanted to report the blood on the tissue. I have reviewed the care plan from the visit. Please advise if there is anything else he should be doing. Thanks

## 2019-11-11 NOTE — Telephone Encounter (Signed)
Seen recently for an anal fissure. On Diltiazem compound ointment. Called patient. Voicemail. Left message to call back.

## 2019-11-12 ENCOUNTER — Ambulatory Visit: Payer: Medicare Other

## 2019-11-12 ENCOUNTER — Other Ambulatory Visit: Payer: Self-pay

## 2019-11-12 NOTE — Telephone Encounter (Signed)
Patient is advised.  He specifically asks if he should take Imodium for his loose bowel movements. Advised that he not take Imodium or anything that is an anti-diarrheal medication. If he is having liquid bowel movements, he should reduce the stool softener. Discussed the goal is to have very soft unformed bowel movements, avoiding straining for a bowel movement. He expresses understanding of this goal.

## 2019-11-12 NOTE — Telephone Encounter (Signed)
Thanks Beth, the blood is most certainly from the fissure. Continue current plan. Please make sure he has follow up with me.

## 2019-11-14 ENCOUNTER — Telehealth: Payer: Self-pay | Admitting: *Deleted

## 2019-11-14 NOTE — Telephone Encounter (Signed)
Received paperwork from patient for [employer].  [employer] [phone]  Fax: [phone] Placed paperwork in [place] folder to review and sign. Patient wants paperwork faxed to [place] once completed.

## 2019-11-14 NOTE — Telephone Encounter (Signed)
Patient called and stated that he is going to drop off forms for [employer]. Stated that [name] fills out one every year. Wants it faxed after completed.

## 2019-11-15 ENCOUNTER — Encounter: Payer: Self-pay | Admitting: Nurse Practitioner

## 2019-11-18 ENCOUNTER — Ambulatory Visit (INDEPENDENT_AMBULATORY_CARE_PROVIDER_SITE_OTHER): Payer: Medicare Other | Admitting: Podiatry

## 2019-11-18 ENCOUNTER — Other Ambulatory Visit: Payer: Self-pay

## 2019-11-18 DIAGNOSIS — B351 Tinea unguium: Secondary | ICD-10-CM

## 2019-11-18 DIAGNOSIS — M79609 Pain in unspecified limb: Secondary | ICD-10-CM | POA: Diagnosis not present

## 2019-11-18 DIAGNOSIS — L989 Disorder of the skin and subcutaneous tissue, unspecified: Secondary | ICD-10-CM | POA: Diagnosis not present

## 2019-11-18 DIAGNOSIS — E0842 Diabetes mellitus due to underlying condition with diabetic polyneuropathy: Secondary | ICD-10-CM

## 2019-11-18 DIAGNOSIS — L97512 Non-pressure chronic ulcer of other part of right foot with fat layer exposed: Secondary | ICD-10-CM

## 2019-11-19 ENCOUNTER — Ambulatory Visit (INDEPENDENT_AMBULATORY_CARE_PROVIDER_SITE_OTHER): Payer: Medicare Other | Admitting: Family

## 2019-11-19 ENCOUNTER — Encounter: Payer: Self-pay | Admitting: Family

## 2019-11-19 VITALS — BP 158/60 | HR 85 | Temp 97.7°F | Ht 72.0 in | Wt 190.0 lb

## 2019-11-19 DIAGNOSIS — E1140 Type 2 diabetes mellitus with diabetic neuropathy, unspecified: Secondary | ICD-10-CM | POA: Diagnosis not present

## 2019-11-19 DIAGNOSIS — L84 Corns and callosities: Secondary | ICD-10-CM | POA: Diagnosis not present

## 2019-11-19 DIAGNOSIS — M2041 Other hammer toe(s) (acquired), right foot: Secondary | ICD-10-CM

## 2019-11-19 DIAGNOSIS — Z794 Long term (current) use of insulin: Secondary | ICD-10-CM

## 2019-11-19 DIAGNOSIS — G4700 Insomnia, unspecified: Secondary | ICD-10-CM

## 2019-11-19 DIAGNOSIS — M21619 Bunion of unspecified foot: Secondary | ICD-10-CM | POA: Diagnosis not present

## 2019-11-19 MED ORDER — MELATONIN 3 MG PO TABS: 3.0000 mg | ORAL_TABLET | Freq: Every day | ORAL | 3 refills | Status: DC

## 2019-11-19 NOTE — Progress Notes (Signed)
Provider: Caidence Higashi FNP-C  [name], NP  Patient Care Team: [name], NP as PCP - General (Geriatric Medicine) [name], MD as PCP - Infectious Diseases (Infectious Diseases) [name], MD as Consulting Physician (Ophthalmology) [name], MD as Consulting Physician (Cardiology) [name], MD as Consulting Physician (Infectious Diseases) Gardiner Barefoot, DPM as Consulting Physician (Podiatry) My Madariaga, Nelda Bucks, NP as Nurse Practitioner (Family Medicine) [place], [name]  Extended Emergency Contact Information Primary Emergency Contact: Bertrum Sol Address: [address]          [place], [place] [address] [place] Phone: [phone] Mobile Phone: [phone] Relation: Friend Secondary Emergency Contact: Alda Berthold [place] Phone: [phone] Relation: Sister  Code Status:  Full code  Goals of care: Advanced Directive information Advanced Directives 10/29/2019  Does Patient Have a Medical Advance Directive? Yes  Type of Advance Directive Living will  Does patient want to make changes to medical advance directive? No - Patient declined  Copy of [place] in Chart? -  Would patient like information on creating a medical advance directive? -     Chief Complaint  Patient presents with  . Acute Visit    fill out form for diabetic shoes    HPI:  Pt is a 69 y.o. male seen today for an acute visit for completion of forms for diabetic shoes.He usually follows up with PCP Dani Gobble but states due to his dialysis schedule unable to see PCP on Tuesdays and Thursdays when is PCP is available at the office.He has a significant medical history of Hypertension,CAD,CHF,Type 2 Diabetes with peripheral Neuropathy,COPD,Anemia,Gout,ESRD on Hemodialysis Monday,wednesday and Friday.He was seen by Clementeen Graham  On 11/18/2019.He requires diabetic shoes due to  diabetic neuropathy,hammer toes,callus and Bunions.He denies any fever,chills.  He also request medication to help him sleep at night states not sleeping well.he has taken sleep time tea but had nightmares.He stopped the sleep time tea. Type 2 DM - No log brought to visit though states since starting on dialysis CBG have improved.recalls reading today in the 140's.   He smokes 1/2 pack per day.Attributes increase smoking due to dialysis states after dialysis he usually has pain and tiredness tends to smoke more.He has not tried to quit smoking.He has nicotine Gum but has not used it.Discussed smoking cessation this visit.He will notify provider if medication desired to quit smoking.      Past Medical History:  Diagnosis Date  . Acute respiratory failure ([facility]) 03/01/2018  . Arthritis    "all over; mostly knees and back" (02/28/2018)  . CHF (congestive heart failure) (H[facility])    not on any meds  . Chronic lower back pain    stenosis  . Community acquired pneumonia 09/06/2013  . COPD (chronic obstructive pulmonary disease) ([facility])   . Coronary atherosclerosis of native coronary artery 2005   s/p surgery  . Drug abuse ([facility])    hx; tested for cocaine as recently as 2/08. says he is not using drugs now - avoided defib. for this reason   . ESRD (end stage renal disease) ([facility])    Hemo M-W-F- Richarda Blade  . GERD (gastroesophageal reflux disease)    takes OTC meds as needed  . GI bleeding 02/06/2019  . Glaucoma    uses eye drops daily  . Hepatitis B 1968   "tx'd w/isolation; caught it from toilet stools in gym"  . History of blood transfusion 03/01/2019  . History of  colon polyps    benign  . History of gout    takes Allopurinol daily as well as Colchicine-if needed (02/28/2018)  . History of kidney stones   . HTN (hypertension)    takes Coreg,Imdur.and Apresoline daily  . Human immunodeficiency virus (HIV) disease ([facility]) dx'd 1995   takes Genvoya daily  . Hyperlipidemia    takes Atorvastatin daily   . Ischemic cardiomyopathy   . Muscle spasm    takes Zanaflex as needed  . Myocardial infarction ([facility]) ~ 2004/2005  . Nocturia   . Peripheral neuropathy    takes gabapentin daily  . Pneumonia    "at least twice" (02/28/2018)  . Shortness of breath dyspnea    rarely but if notices it then with exertion  . Syphilis, unspecified   . Type II diabetes mellitus ([facility]) 2004   Lantus daily.Average fasting blood sugar 125-199  . Wears glasses   . Wears partial dentures    Past Surgical History:  Procedure Laterality Date  . AV FISTULA PLACEMENT Left 08/02/2018   Procedure: ARTERIOVENOUS (AV) FISTULA CREATION  left arm radiocephlic;  Surgeon: Marty Heck, MD;  Location: [facility];  Service: Vascular;  Laterality: Left;  . AV FISTULA PLACEMENT Left 08/01/2019   Procedure: LEFT BRACHIOCEPHALIC ARTERIOVENOUS (AV) FISTULA CREATION;  Surgeon: [name], MD;  Location: [facility];  Service: Vascular;  Laterality: Left;  . BASCILIC VEIN TRANSPOSITION Left 10/03/2019   Procedure: BASILIC VEIN TRANSPOSITION LEFT SECOND STAGE;  Surgeon: [name], MD;  Location: [facility];  Service: Vascular;  Laterality: Left;  . CARDIAC CATHETERIZATION  10/2002; 12/19/2004   [name] 03/08/2011  . COLONOSCOPY  2013   Dr.John Henrene Pastor   . CORONARY ARTERY BYPASS GRAFT  02/24/2003   CABG X2/notes 03/08/2011  . ESOPHAGOGASTRODUODENOSCOPY (EGD) WITH PROPOFOL N/A 02/08/2019   Procedure: ESOPHAGOGASTRODUODENOSCOPY (EGD) WITH PROPOFOL;  Surgeon: Milus Banister, MD;  Location: [facility];  Service: Gastroenterology;  Laterality: N/A;  . HOT HEMOSTASIS N/A 02/08/2019   Procedure: HOT HEMOSTASIS (ARGON PLASMA COAGULATION/BICAP);  Surgeon: Milus Banister, MD;  Location[facility] ENDOSCOPY;  Service: Gastroenterology;  Laterality: N/A;  . INTERTROCHANTERIC HIP FRACTURE SURGERY Left 11/2006   [name] 03/08/2011  . IR FLUORO GUIDE CV LINE RIGHT  07/24/2019  . IR FLUORO GUIDE CV LINE RIGHT  07/30/2019  . IR US GUIDE VASC ACCESS RIGHT   07/24/2019  . IR US GUIDE VASC ACCESS RIGHT  07/30/2019  . LAPAROSCOPIC CHOLECYSTECTOMY  05/2006  . LIGATION OF COMPETING BRANCHES OF ARTERIOVENOUS FISTULA Left 11/05/2018   Procedure: LIGATION OF COMPETING BRANCHES OF ARTERIOVENOUS FISTULA  LEFT  ARM;  Surgeon: Marty Heck, MD;  Location: [facility];  Service: Vascular;  Laterality: Left;  . LUMBAR LAMINECTOMY/DECOMPRESSION MICRODISCECTOMY N/A 02/29/2016   Procedure: Left L4-5 Lateral Recess Decompression, Removal Extradural Intraspinal Facet Cyst;  Surgeon: Marybelle Killings, MD;  Location: [facility];  Service: Orthopedics;  Laterality: N/A;  . MULTIPLE TOOTH EXTRACTIONS    . ORIF MANDIBULAR FRACTURE Left 08/13/2004   ORIF of left body fracture mandible with KLS Martin 2.3-mm six hole/notes 03/08/2011    Allergies  Allergen Reactions  . Augmentin [Amoxicillin-Pot Clavulanate] Diarrhea    Severe  . Amphetamines Other (See Comments)    Unknown reaction type    Outpatient Encounter Medications as of 11/19/2019  Medication Sig  . acetaminophen (TYLENOL) 500 MG tablet Take 500 mg by mouth every 6 (six) hours as needed.  [name] allopurinol (ZYLOPRIM) 100 MG tablet TAKE 1 TABLET(100 MG) BY  MOUTH DAILY  . aspirin EC 81 MG tablet Take 81 mg by mouth daily.  [name] atorvastatin (LIPITOR) 80 MG tablet Take 1 tablet (80 mg total) by mouth daily.  . B-D UF III MINI PEN NEEDLES 31G X 5 MM MISC USE FOUR TIMES DAILY  . BIKTARVY 50-200-25 MG TABS tablet TAKE 1 TABLET BY MOUTH DAILY  . calcitRIOL (ROCALTROL) 0.25 MCG capsule Take 0.25 mcg by mouth daily.   . Choline Fenofibrate (FENOFIBRIC ACID) 135 MG CPDR TAKE 1 CAPSULE BY MOUTH DAILY  . colchicine 0.6 MG tablet Take 0.6 mg by mouth daily as needed (pain).  [name] gabapentin (NEURONTIN) 300 MG capsule Take 1 capsule (300 mg total) by mouth daily.  [name] glucose blood test strip Use to test blood sugar three times daily. E11.40  . insulin lispro (HUMALOG) 100 UNIT/ML injection Inject 20 Units into the skin daily as needed for  high blood sugar (above 150).   . isosorbide mononitrate (IMDUR) 30 MG 24 hr tablet TAKE 1 TABLET(30 MG) BY MOUTH DAILY  . Lancets (ONETOUCH DELICA PLUS [id number]) MISC Inject 1 Device as directed 3 (three) times daily. Dx: E11.40  . latanoprost (XALATAN) 0.005 % ophthalmic solution Place 1 drop into both eyes at bedtime.  . multivitamin (RENA-VIT) TABS tablet Take 1 tablet by mouth daily.  . nitroGLYCERIN (NITROSTAT) 0.3 MG SL tablet ONE TABLET UNDER TONGUE AS NEEDED FOR CHEST PAIN  . pantoprazole (PROTONIX) 40 MG tablet Take 1 tablet (40 mg total) by mouth daily.  . sevelamer carbonate (RENVELA) 800 MG tablet Take 800 mg by mouth 3 (three) times daily with meals.  . simethicone (MYLICON) 80 MG chewable tablet Chew 1 tablet (80 mg total) by mouth 4 (four) times daily as needed for flatulence.  . timolol (TIMOPTIC) 0.5 % ophthalmic solution Place 1 drop into both eyes every morning.  . umeclidinium-vilanterol (ANORO ELLIPTA) 62.5-25 MCG/INH AEPB INHALE 1 PUFF BY MOUTH EVERY DAY  . [DISCONTINUED] acetaminophen (TYLENOL 8 HOUR) 650 MG CR tablet Take 1,300 mg by mouth every 8 (eight) hours as needed for pain.   . [DISCONTINUED] diclofenac sodium (VOLTAREN) 1 % GEL Apply 1 application topically 4 (four) times daily as needed for pain.  . [DISCONTINUED] diltiazem 2 % GEL Apply 1 application topically 3 (three) times daily.   No facility-administered encounter medications on file as of 11/19/2019.    Review of Systems  Constitutional: Negative for appetite change, chills, fatigue and fever.  Respiratory: Negative for cough, chest tightness, shortness of breath and wheezing.   Cardiovascular: Positive for leg swelling. Negative for chest pain and palpitations.       Chronic Left leg swelling   Gastrointestinal: Negative for abdominal distention, abdominal pain, constipation, diarrhea, nausea and vomiting.  Genitourinary:       On Hemodialysis Mon,Wed and Friday.   Musculoskeletal: Negative for  arthralgias and gait problem.  Skin: Negative for color change, pallor and rash.       Right leg callus debrided by Podiatrist 11/18/2019 patient applied band aid this morning  Neurological: Positive for numbness. Negative for dizziness, weakness, light-headedness and headaches.       Numbness and tingling of feet   Psychiatric/Behavioral: Positive for sleep disturbance. Negative for agitation and confusion. The patient is not nervous/anxious.     Immunization History  Administered Date(s) Administered  . Fluad Quad(high Dose 65+) 06/14/2019  . Hepatitis B 08/28/2006, 10/02/2007, 04/01/2008  . Hepatitis B, adult 06/03/2014, 07/04/2014  . Influenza Split 07/28/2011  . Influenza Whole  08/28/2006, 09/10/2007, 09/15/2008, 08/03/2009, 07/26/2010  . Influenza, High Dose Seasonal PF 07/04/2018  . Influenza,inj,Quad PF,6+ Mos 07/04/2014, 07/06/2015, 07/12/2016, 07/11/2017  . Influenza-Unspecified 06/24/2013  . Pneumococcal Conjugate-13 08/18/2016  . Pneumococcal Polysaccharide-23 08/28/2006, 07/28/2011, 05/30/2018  . Tdap 06/14/2019  . Zoster 06/03/2014  . Zoster Recombinat (Shingrix) 07/24/2017, 01/09/2018   Pertinent  Health Maintenance Due  Topic Date Due  . FOOT EXAM  12/06/2019  . HEMOGLOBIN A1C  02/07/2020  . LIPID PANEL  06/03/2020  . OPHTHALMOLOGY EXAM  10/07/2020  . COLONOSCOPY  10/01/2022  . INFLUENZA VACCINE  Completed  . PNA vac Low Risk Adult  Completed   Fall Risk  11/19/2019 10/29/2019 08/15/2019 08/06/2019 07/08/2019  Falls in the past year? 0 0 0 0 0  Comment - - - - -  Number falls in past yr: 0 0 0 0 0  Comment - - - - -  Injury with Fall? 0 0 0 0 0  Comment - - - - -  Risk Factor Category  - - - - -  Risk for fall due to : - - - - -  Risk for fall due to: Comment - - - - -  Follow up - - - - -    Vitals:   11/19/19 1023  BP: (!) 158/60  Pulse: 85  Temp: 97.7 F (36.5 C)  TempSrc: Oral  SpO2: 96%  Weight: 190 lb (86.2 kg)  Height: 6' (1.829 m)   Body  mass index is 25.77 kg/m. Physical Exam Vitals reviewed.  Constitutional:      General: He is not in acute distress.    Appearance: He is overweight. He is not ill-appearing.  HENT:     Head: Normocephalic.  Eyes:     General: No scleral icterus.       Right eye: No discharge.        Left eye: No discharge.     Conjunctiva/sclera: Conjunctivae normal.     Pupils: Pupils are equal, round, and reactive to light.  Cardiovascular:     Rate and Rhythm: Normal rate and regular rhythm.     Pulses: Normal pulses.          Dorsalis pedis pulses are 2+ on the right side and 2+ on the left side.     Heart sounds: Murmur present. No friction rub. No gallop.   Pulmonary:     Effort: Pulmonary effort is normal. No respiratory distress.     Breath sounds: Normal breath sounds. No wheezing, rhonchi or rales.  Chest:     Chest wall: No tenderness.  Abdominal:     General: Bowel sounds are normal. There is no distension.     Palpations: Abdomen is soft. There is no mass.     Tenderness: There is no abdominal tenderness. There is no right CVA tenderness, left CVA tenderness, guarding or rebound.  Musculoskeletal:        General: No tenderness.     Right foot: Normal range of motion. Bunion present.     Left foot: Normal range of motion. Bunion present.       Feet:  Feet:     Right foot:     Skin integrity: Callus present. No erythema or warmth.     Left foot:     Skin integrity: No erythema or warmth.     Comments: Bilateral feet decreased sensation to monofilament  Skin:    General: Skin is warm.     Coloration: Skin is not  pale.     Findings: No bruising or erythema.  Neurological:     Mental Status: He is oriented to person, place, and time.     Motor: No weakness.     Gait: Gait normal.  Psychiatric:        Mood and Affect: Mood normal.        Behavior: Behavior normal.        Thought Content: Thought content normal.        Judgment: Judgment normal.    Labs reviewed: Recent  Labs    07/21/19 0928 07/21/19 0943 07/30/19 1219 07/30/19 1219 07/31/19 0318 07/31/19 0318 08/01/19 0325 08/01/19 0325 10/03/19 0926 10/21/19 1035 11/09/19 0000  NA 140   < > 132*   < > 131*   < > 132*   < > 136 133* 137  K 4.5   < > 3.9   < > 4.1   < > 3.8   < > 3.3* 3.5 4.6  CL 108   < > 96*   < > 96*   < > 95*   < > 94* 93* 98*  CO2 23   < > 19*   < > 18*  --  20*  --   --  26  --   GLUCOSE 110*   < > 155*   < > 202*   < > 205*  --  120* 210*  --   BUN 72*   < > 99*   < > 117*   < > 65*   < > 28* 55* 61*  CREATININE 5.55*   < > 7.39*   < > 7.77*   < > 5.73*   < > 4.20* 7.06* 6.3*  CALCIUM 9.0   < > 8.4*   < > 8.4*   < > 8.5*  --   --  8.9 9.1  MG 1.8  --   --   --   --   --   --   --   --   --   --   PHOS 5.1*   < > 7.5*  --  8.2*  --  6.5*  --   --   --   --    < > = values in this interval not displayed.   Recent Labs    07/21/19 0928 07/21/19 1821 07/23/19 0608 07/24/19 0241 08/01/19 0325 10/21/19 1035 11/09/19 0000  AST 95*  --  47*  --   --  177*  --   ALT 51*  --  34  --   --  214*  --   ALKPHOS 42  --  43  --   --  86 74  BILITOT 0.7  --  1.2  --   --  1.0  --   PROT 7.7  --  7.6  --   --  7.4  --   ALBUMIN 3.2*   < > 3.0*   < > 2.2* 3.3* 5.0   < > = values in this interval not displayed.   Recent Labs    02/06/19 1045 02/06/19 1817 07/21/19 0928 07/21/19 0943 07/29/19 0357 07/30/19 1219 08/01/19 0325 08/02/19 0000 10/21/19 1035 11/07/19 1441 11/09/19 0000  WBC 5.8   < > 7.2   < > 6.7   < > 7.8  --  5.4 3.8* 3.3  NEUTROABS 2.7  --  5.6  --  4.4  --   --   --   --   --   --  HGB 6.4*   < > 9.7*   < > 8.2*   < > 9.4*   < > 8.1* 8.2 Repeated and verified X2.* 7.9*  HCT 20.3*   < > 28.9*   < > 24.4*   < > 27.3*   < > 26.5* 26.1* 27*  MCV 96.2   < > 100.3*   < > 96.1   < > 92.2  --  101.1* 93.6  --   PLT 199   < > 200   < > 205   < > 298  --  230 131.0* 150   < > = values in this interval not displayed.   Lab Results  Component Value Date   TSH  0.81 07/18/2017   Lab Results  Component Value Date   HGBA1C 5.0 11/09/2019   Lab Results  Component Value Date   CHOL 158 06/04/2019   HDL 19 (L) 06/04/2019   [place]  06/04/2019     Comment:     . LDL cholesterol not calculated. Triglyceride levels greater than 400 mg/dL invalidate calculated LDL results. . Reference range: <100 . Desirable range <100 mg/dL for primary prevention;   <70 mg/dL for patients with CHD or diabetic patients  with > or = 2 CHD risk factors. [name] LDL-C is now calculated using the Martin-Hopkins  calculation, which is a validated novel method providing  better accuracy than the Friedewald equation in the  estimation of LDL-C.  Cresenciano Genre et al. Annamaria Helling. 2993;716(96): 2061-2068  (http://education.[facility].com/faq/FAQ164)    TRIG 574 (H) 06/04/2019   CHOLHDL 8.3 (H) 06/04/2019    Significant Diagnostic Results in last 30 days:  DG Chest Port 1 View  Result Date: 10/21/2019 CLINICAL DATA:  Dyspnea, low hemoglobin EXAM: PORTABLE CHEST 1 VIEW COMPARISON:  07/25/2019 FINDINGS: Tunneled right dialysis catheter tip overlies the central SVC. Mild chronic interstitial prominence. Stable cardiomegaly. Evidence of prior cardiac surgery. No significant pleural effusion. No pneumothorax. IMPRESSION: Mild chronic interstitial prominence. There may be mild superimposed interstitial edema. Electronically Signed   By: Macy Mis M.D.   On: 10/21/2019 13:47   VAS US DUPLEX DIALYSIS ACCESS (AVF,AVG)  Result Date: 10/29/2019 DIALYSIS ACCESS Reason for Exam: Routine follow up. Access Site: Left Upper Extremity. Access Type: Basilic vein transposition. History: AVF placed on 10/03/2019. Performing Technologist: Delorise Shiner RVT  Examination Guidelines: A complete evaluation includes B-mode imaging, spectral Doppler, color Doppler, and power Doppler as needed of all accessible portions of each vessel. Unilateral testing is considered an integral part of a complete  examination. Limited examinations for reoccurring indications may be performed as noted.  Findings: +--------------------+----------+-----------------+--------+ AVF                 PSV (cm/s)Flow Vol (mL/min)Comments +--------------------+----------+-----------------+--------+ Native artery inflow   216          1814                +--------------------+----------+-----------------+--------+ AVF Anastomosis        764                              +--------------------+----------+-----------------+--------+  +------------+----------+-------------+----------+--------+ OUTFLOW VEINPSV (cm/s)Diameter (cm)Depth (cm)Describe +------------+----------+-------------+----------+--------+ Prox UA         62        1.28        1.26            +------------+----------+-------------+----------+--------+ Mid UA  147        1.00        0.65            +------------+----------+-------------+----------+--------+ Dist UA        116        0.68        0.25            +------------+----------+-------------+----------+--------+ AC Fossa       189        0.70        0.28            +------------+----------+-------------+----------+--------+   Summary: Patent arteriovenous fistula. *See table(s) above for measurements and observations.  Diagnosing physician: Curt Jews MD Electronically signed by Curt Jews MD on 10/29/2019 at 5:06:10 PM.   --------------------------------------------------------------------------------   Final     Assessment/Plan 1. Type 2 diabetes mellitus with diabetic neuropathy, with long-term current use of insulin (HCC) Lab Results  Component Value Date   HGBA1C 5.0 11/09/2019  CBG under controlled.continue on Humalog 20 units daily as needed for CBG> 150 - continue on Gabapentin 300 mg capsule daily for Neuropathy.on ASA ans Statin.  2. Hammer toe of right foot Continue to follow up with Podiatrist as directed.Paper work filled and faxed for diabetic  shoes.Faxed by CMA to [facility] [address] ,[place] [address] Tele # [phone] Fax [phone]   3. Callus of foot Continue to follow up with podiatrist.requires diabetic shoe to prevent further skin breakdown and ulceration.Paperwork filled and faxed  as above.   4. Bunion of great toe Bilateral great toe.No ulceration.continue to follow up with podiatrist.   5. Insomnia, unspecified type Will try melatonin 3 mg tablet will  increase to 6 mg tablet at bedtime if not effective.  - Melatonin 3 MG TABS; Take 1 tablet (3 mg total) by mouth at bedtime.  Dispense: 30 tablet; Refill: 3  Family/ staff Communication: Reviewed plan of care with patient  Labs/tests ordered: None   Next Appointment: Has upcoming appointment 01/28/2020 for 4 month follow up.  [name], NP

## 2019-11-19 NOTE — Patient Instructions (Signed)
- Diabetic shoes paperwork filled today will be faxed to [phone] as provided. - cutt down on smoking notify provider if medication to quit smoking desired - Take Melatoinin 3 mg tablet one by mouth daily for sleep   Smoking Tobacco Information, Adult Smoking tobacco can be harmful to your health. Tobacco contains a poisonous (toxic), colorless chemical called nicotine. Nicotine is addictive. It changes the brain and can make it hard to stop smoking. Tobacco also has other toxic chemicals that can hurt your body and raise your risk of many cancers. How can smoking tobacco affect me? Smoking tobacco puts you at risk for:  Cancer. Smoking is most commonly associated with lung cancer, but can also lead to cancer in other parts of the body.  Chronic obstructive pulmonary disease (COPD). This is a long-term lung condition that makes it hard to breathe. It also gets worse over time.  High blood pressure (hypertension), heart disease, stroke, or heart attack.  Lung infections, such as pneumonia.  Cataracts. This is when the lenses in the eyes become clouded.  Digestive problems. This may include peptic ulcers, heartburn, and gastroesophageal reflux disease (GERD).  Oral health problems, such as gum disease and tooth loss.  Loss of taste and smell. Smoking can affect your appearance by causing:  Wrinkles.  Yellow or stained teeth, fingers, and fingernails. Smoking tobacco can also affect your social life, because:  It may be challenging to find places to smoke when away from home. Many workplaces, [employer], hotels, and public places are tobacco-free.  Smoking is expensive. This is due to the cost of tobacco and the long-term costs of treating health problems from smoking.  Secondhand smoke may affect those around you. Secondhand smoke can cause lung cancer, breathing problems, and heart disease. Children of smokers have a higher risk for: ? Sudden infant death syndrome  (SIDS). ? Ear infections. ? Lung infections. If you currently smoke tobacco, quitting now can help you:  Lead a longer and healthier life.  Look, smell, breathe, and feel better over time.  Save money.  Protect others from the harms of secondhand smoke. What actions can I take to prevent health problems? Quit smoking   Do not start smoking. Quit if you already do.  Make a plan to quit smoking and commit to it. Look for programs to help you and ask your health care provider for recommendations and ideas.  Set a date and write down all the reasons you want to quit.  Let your friends and family know you are quitting so they can help and support you. Consider finding friends who also want to quit. It can be easier to quit with someone else, so that you can support each other.  Talk with your health care provider about using nicotine replacement medicines to help you quit, such as gum, lozenges, patches, sprays, or pills.  Do not replace cigarette smoking with electronic cigarettes, which are commonly called e-cigarettes. The safety of e-cigarettes is not known, and some may contain harmful chemicals.  If you try to quit but return to smoking, stay positive. It is common to slip up when you first quit, so take it one day at a time.  Be prepared for cravings. When you feel the urge to smoke, chew gum or suck on hard candy. Lifestyle  Stay busy and take care of your body.  Drink enough fluid to keep your urine pale yellow.  Get plenty of exercise and eat a healthy diet. This can help prevent  weight gain after quitting.  Monitor your eating habits. Quitting smoking can cause you to have a larger appetite than when you smoke.  Find ways to relax. Go out with friends or family to a movie or a restaurant where people do not smoke.  Ask your health care provider about having regular tests (screenings) to check for cancer. This may include blood tests, imaging tests, and other  tests.  Find ways to manage your stress, such as meditation, yoga, or exercise. Where to find support To get support to quit smoking, consider:  Asking your health care provider for more information and resources.  Taking classes to learn more about quitting smoking.  Looking for local organizations that offer resources about quitting smoking.  Joining a support group for people who want to quit smoking in your local community.  Calling the smokefree.gov counselor helpline: 1-800-Quit-Now 508-274-3774) Where to find more information You may find more information about quitting smoking from:  HelpGuide.org: www.helpguide.org  https://hall.com/: smokefree.gov  American Lung Association: www.lung.org Contact a health care provider if you:  Have problems breathing.  Notice that your lips, nose, or fingers turn blue.  Have chest pain.  Are coughing up blood.  Feel faint or you pass out.  Have other health changes that cause you to worry. Summary  Smoking tobacco can negatively affect your health, the health of those around you, your finances, and your social life.  Do not start smoking. Quit if you already do. If you need help quitting, ask your health care provider.  Think about joining a support group for people who want to quit smoking in your local community. There are many effective programs that will help you to quit this behavior. This information is not intended to replace advice given to you by your health care provider. Make sure you discuss any questions you have with your health care provider. Document Revised: 07/05/2019 Document Reviewed: 10/25/2016 Elsevier Patient Education  [place].    Insomnia Insomnia is a sleep disorder that makes it difficult to fall asleep or stay asleep. Insomnia can cause fatigue, low energy, difficulty concentrating, mood swings, and poor performance at work or school. There are three different ways to classify  insomnia:  Difficulty falling asleep.  Difficulty staying asleep.  Waking up too early in the morning. Any type of insomnia can be long-term (chronic) or short-term (acute). Both are common. Short-term insomnia usually lasts for three months or less. Chronic insomnia occurs at least three times a week for longer than three months. What are the causes? Insomnia may be caused by another condition, situation, or substance, such as:  Anxiety.  Certain medicines.  Gastroesophageal reflux disease (GERD) or other gastrointestinal conditions.  Asthma or other breathing conditions.  Restless legs syndrome, sleep apnea, or other sleep disorders.  Chronic pain.  Menopause.  Stroke.  Abuse of alcohol, tobacco, or illegal drugs.  Mental health conditions, such as depression.  Caffeine.  Neurological disorders, such as Alzheimer's disease.  An overactive thyroid (hyperthyroidism). Sometimes, the cause of insomnia may not be known. What increases the risk? Risk factors for insomnia include:  Gender. Women are affected more often than men.  Age. Insomnia is more common as you get older.  Stress.  Lack of exercise.  Irregular work schedule or working night shifts.  Traveling between different time zones.  Certain medical and mental health conditions. What are the signs or symptoms? If you have insomnia, the main symptom is having trouble falling asleep or having  trouble staying asleep. This may lead to other symptoms, such as:  Feeling fatigued or having low energy.  Feeling nervous about going to sleep.  Not feeling rested in the morning.  Having trouble concentrating.  Feeling irritable, anxious, or depressed. How is this diagnosed? This condition may be diagnosed based on:  Your symptoms and medical history. Your health care provider may ask about: ? Your sleep habits. ? Any medical conditions you have. ? Your mental health.  A physical exam. How is this  treated? Treatment for insomnia depends on the cause. Treatment may focus on treating an underlying condition that is causing insomnia. Treatment may also include:  Medicines to help you sleep.  Counseling or therapy.  Lifestyle adjustments to help you sleep better. Follow these instructions at home: Eating and drinking   Limit or avoid alcohol, caffeinated beverages, and cigarettes, especially close to bedtime. These can disrupt your sleep.  Do not eat a large meal or eat spicy foods right before bedtime. This can lead to digestive discomfort that can make it hard for you to sleep. Sleep habits   Keep a sleep diary to help you and your health care provider figure out what could be causing your insomnia. Write down: ? When you sleep. ? When you wake up during the night. ? How well you sleep. ? How rested you feel the next day. ? Any side effects of medicines you are taking. ? What you eat and drink.  Make your bedroom a dark, comfortable place where it is easy to fall asleep. ? Put up shades or blackout curtains to block light from outside. ? Use a white noise machine to block noise. ? Keep the temperature cool.  Limit screen use before bedtime. This includes: ? Watching TV. ? Using your smartphone, tablet, or computer.  Stick to a routine that includes going to bed and waking up at the same times every day and night. This can help you fall asleep faster. Consider making a quiet activity, such as reading, part of your nighttime routine.  Try to avoid taking naps during the day so that you sleep better at night.  Get out of bed if you are still awake after 15 minutes of trying to sleep. Keep the lights down, but try reading or doing a quiet activity. When you feel sleepy, go back to bed. General instructions  Take over-the-counter and prescription medicines only as told by your health care provider.  Exercise regularly, as told by your health care provider. Avoid exercise  starting several hours before bedtime.  Use relaxation techniques to manage stress. Ask your health care provider to suggest some techniques that may work well for you. These may include: ? Breathing exercises. ? Routines to release muscle tension. ? Visualizing peaceful scenes.  Make sure that you drive carefully. Avoid driving if you feel very sleepy.  Keep all follow-up visits as told by your health care provider. This is important. Contact a health care provider if:  You are tired throughout the day.  You have trouble in your daily routine due to sleepiness.  You continue to have sleep problems, or your sleep problems get worse. Get help right away if:  You have serious thoughts about hurting yourself or someone else. If you ever feel like you may hurt yourself or others, or have thoughts about taking your own life, get help right away. You can go to your nearest emergency department or call:  Your local emergency services (911 in the  [place]  A suicide crisis helpline, such as the [facility] at 972 065 9077. This is open 24 hours a day. Summary  Insomnia is a sleep disorder that makes it difficult to fall asleep or stay asleep.  Insomnia can be long-term (chronic) or short-term (acute).  Treatment for insomnia depends on the cause. Treatment may focus on treating an underlying condition that is causing insomnia.  Keep a sleep diary to help you and your health care provider figure out what could be causing your insomnia. This information is not intended to replace advice given to you by your health care provider. Make sure you discuss any questions you have with your health care provider. Document Revised: 09/22/2017 Document Reviewed: 07/20/2017 Elsevier Patient Education  2020 [employer].

## 2019-11-20 NOTE — Progress Notes (Signed)
Subjective: Patient is a 69 y.o. male presenting to the office today for follow up evaluation of painful callus lesion(s) noted to the bilateral feet. Walking and bearing weight increases the pain. He has not done anything for treatment at home.  Patient also complains of elongated, thickened nails that cause pain while ambulating in shoes. He is unable to trim his own nails.  He is also here for follow up evaluation of an ulceration of the right foot. He states the wound looks about the same. He denies any significant pain or modifying factors. He has been keeping the area clean for treatment. Patient presents today for further treatment and evaluation.  Past Medical History:  Diagnosis Date  . Acute respiratory failure ([place]) 03/01/2018  . Arthritis    "all over; mostly knees and back" (02/28/2018)  . CHF (congestive heart failure) ([facility])    not on any meds  . Chronic lower back pain    stenosis  . Community acquired pneumonia 09/06/2013  . COPD (chronic obstructive pulmonary disease) ([place])   . Coronary atherosclerosis of native coronary artery 2005   s/p surgery  . Drug abuse ([place])    hx; tested for cocaine as recently as 2/08. says he is not using drugs now - avoided defib. for this reason   . ESRD (end stage renal disease) ([place])    H[facility] M-W-F- Richarda Blade  . GERD (gastroesophageal reflux disease)    takes OTC meds as needed  . GI bleeding 02/06/2019  . Glaucoma    uses eye drops daily  . Hepatitis B 1968   "tx'd w/isolation; caught it from toilet stools in gym"  . History of blood transfusion 03/01/2019  . History of colon polyps    benign  . History of gout    takes Allopurinol daily as well as Colchicine-if needed (02/28/2018)  . History of kidney stones   . HTN (hypertension)    takes Coreg,Imdur.and Apresoline daily  . Human immunodeficiency virus (HIV) disease ([place]) dx'd 1995   takes Genvoya daily  . Hyperlipidemia    takes Atorvastatin daily  . Ischemic cardiomyopathy     . Muscle spasm    takes Zanaflex as needed  . Myocardial infarction ([facility]) ~ 2004/2005  . Nocturia   . Peripheral neuropathy    takes gabapentin daily  . Pneumonia    "at least twice" (02/28/2018)  . Shortness of breath dyspnea    rarely but if notices it then with exertion  . Syphilis, unspecified   . Type II diabetes mellitus ([place]) 2004   Lantus daily.Average fasting blood sugar 125-199  . Wears glasses   . Wears partial dentures     Objective:  Physical Exam General: Alert and oriented x3 in no acute distress  Dermatology: Hyperkeratotic lesion(s) present on the bilateral feet. Pain on palpation with a central nucleated core noted. Skin is warm, dry and supple bilateral lower extremities. Negative for open lesions or macerations. Nails are tender, long, thickened and dystrophic with subungual debris, consistent with onychomycosis, 1-5 bilateral. No signs of infection noted.  Wound #1 noted to the right foot measuring approximately 1.0 x 0.5 x 0.2 cm.   To the above-noted ulceration, there is no eschar. There is a moderate amount of slough, fibrin and necrotic tissue. Granulation tissue and wound base is red. There is no malodor. There is a minimal amount of serosanginous drainage noted. Periwound integrity is intact.   Vascular: Palpable pedal pulses bilaterally. No edema or erythema noted.  Capillary refill within normal limits.  Neurological: Epicritic and protective threshold diminished bilaterally.   Musculoskeletal Exam: Pain on palpation at the keratotic lesion(s) noted. Range of motion within normal limits bilateral. Muscle strength 5/5 in all groups bilateral.  Assessment: 1. Onychodystrophic nails 1-5 bilateral with hyperkeratosis of nails.  2. Onychomycosis of nail due to dermatophyte bilateral 3. Pre-ulcerative callus lesions noted to the bilateral feet x 5 4. Ulceration right foot secondary to diabetes mellitus    Plan of Care:  1. Patient evaluated. 2.  Excisional debridement of keratoic lesion(s) using a chisel blade was performed without incident.  3. Dressed with light dressing. 4. Mechanical debridement of nails 1-5 bilaterally performed using a nail nipper. Filed with dremel without incident.  5. Medically necessary excisional debridement including subcutaneous tissue was performed using a tissue nipper and a chisel blade. Excisional debridement of all the necrotic nonviable tissue down to healthy bleeding viable tissue was performed with post-debridement measurements same as pre-. 6. The wound was cleansed and dry sterile dressing applied. 7. Return to clinic in 4 weeks.     Edrick Kins, DPM [facility]  Dr. Edrick Kins, [address]                                        [place], [place] [address]                Office [phone]  Fax [phone]

## 2019-11-23 ENCOUNTER — Other Ambulatory Visit: Payer: Self-pay | Admitting: Nurse Practitioner

## 2019-11-23 DIAGNOSIS — J449 Chronic obstructive pulmonary disease, unspecified: Secondary | ICD-10-CM

## 2019-11-26 ENCOUNTER — Telehealth: Payer: Self-pay | Admitting: Cardiovascular Disease

## 2019-11-26 DIAGNOSIS — R600 Localized edema: Secondary | ICD-10-CM

## 2019-11-26 DIAGNOSIS — M79604 Pain in right leg: Secondary | ICD-10-CM

## 2019-11-26 NOTE — Telephone Encounter (Signed)
Can do LE ABI"s with LE arterial duplex for claudication and bilateral LE venous duplex to r/o DVT He can f/u with Dr Donnetta Hutching / [facility] Slade vascular who have seen him in past for his dialysis access

## 2019-11-26 NOTE — Telephone Encounter (Signed)
Pt advised Dr. Kyla Balzarine recommendations for vasc studies... he agreed but declined coming in for at least part of it today to r/o DVT and says he is in dialysis Wed and Fri... he is having a catheter replaced on Thursday.. I advised him that I will place the orders and he can work with the schedulers to get him in asap.... Pt to talk with Dr. Trula Slade about his symptoms as well.

## 2019-11-26 NOTE — Telephone Encounter (Signed)
Pt called to report that he has been having left leg, calf and foot, edema for over 2 weeks.   He denies claudication, no change in color, he can still wear his normal shoes.   He says no [ain but he also says that he uses a "thermal" pain cream.. he says both of his legs hurt after he sits with his feet elevated at Dialysis.   Pt also reports some exertional SOB when ealking to the dumpster but is feels fine at rest.   He saw his PCP Sherrie Mustache NP last week for Diabetic shoes and she assessed the edema and told him to stop smoking because he could have "circulation" issues.   He goes back to Dialysis tomorrow and will have them take a look at it.   He has history of Gout but says it does not hurt like Gout.   Pt is seeing Dr. [name] 12/12/19.[name] will forward to him for review.

## 2019-11-26 NOTE — Telephone Encounter (Signed)
Pt c/o swelling: STAT is pt has developed SOB within 24 hours  1) How much weight have you gained and in what time span? No  2) If swelling, where is the swelling located? Left leg   3) Are you currently taking a fluid pill? No   4) Are you currently SOB? No   5) Do you have a log of your daily weights (if so, list)? No   6) Have you gained 3 pounds in a day or 5 pounds in a week? No   7) Have you traveled recently? No   Patient is calling concerned with swelling in left leg. He states it is so swollen he cannot put a compression sock on. He has also been experiencing SOB when moving around, but is not experiencing it now. From what he is aware he has not gained any weight due to it.

## 2019-11-27 ENCOUNTER — Telehealth: Payer: Self-pay | Admitting: Nurse Practitioner

## 2019-11-27 ENCOUNTER — Encounter: Payer: Self-pay | Admitting: Internal Medicine

## 2019-11-27 NOTE — Telephone Encounter (Signed)
Discussed with pt that he was instructed to take 2 stool softeners at night. He had started to have some loose stools and was told to stop the stool softeners. Now he reports he is given iron at dialysis and he needs to take the stool softeners again. Discussed with him that he can take 2 at bed time again as needed.

## 2019-12-03 ENCOUNTER — Other Ambulatory Visit: Payer: Self-pay

## 2019-12-03 ENCOUNTER — Other Ambulatory Visit: Payer: Self-pay | Admitting: Cardiovascular Disease

## 2019-12-03 ENCOUNTER — Ambulatory Visit (HOSPITAL_COMMUNITY)
Admission: RE | Admit: 2019-12-03 | Discharge: 2019-12-03 | Disposition: A | Discharge status: Home / Self Care | Payer: Medicare Other | Source: Ambulatory Visit | Attending: Internal Medicine | Admitting: Internal Medicine

## 2019-12-03 DIAGNOSIS — R600 Localized edema: Secondary | ICD-10-CM | POA: Diagnosis not present

## 2019-12-03 DIAGNOSIS — M79604 Pain in right leg: Secondary | ICD-10-CM | POA: Insufficient documentation

## 2019-12-03 DIAGNOSIS — M79605 Pain in left leg: Secondary | ICD-10-CM | POA: Diagnosis present

## 2019-12-04 ENCOUNTER — Ambulatory Visit: Payer: Medicare Other | Admitting: Podiatry

## 2019-12-04 NOTE — Progress Notes (Deleted)
Cardiology Office Note   Date:  12/11/2019   ID:  Jacob [name], DOB September 09, 1951, MRN [id number]  PCP:  [name], NP  Cardiologist:  Dr. [name]    No chief complaint on file.     History of Present Illness:  69 y.o. with distant CABG in 2004 with EF improving from 24% to 45-50% after revascularization. Last myovue 2016 with scar no ischemia. Has done well with medical Rx and cath avoided due to CRF Now on dialysis  Followed by Dr [name] Had revision of left arm fistula January 2019  Comorbid conditions include HIV previous drug abuse HTN and HLD  Echo 02/07/19 EF 40-45% Severe LAE no significant valve disease  Myovue 12/13/18 abnormal EF 26% large inferior wall infarct   Cr 5.2 03/08/19  Hct 25.8 transfused 02/11/19  EGD AVM;s Rx did not have colonoscopy Iron Rx given   He had 2nd stage basilic vein transposition left arm with Dr Donnetta Hutching 10/03/19   Still anemia  hct 27 PLT 150  Cr 6.3 BUN 61 K 4.6 11/09/19    DM better controlled with recent A1c normal ABI's done 12/05/19 ***  Drinking to much [place] wine   Complaining of more LE edema venous duplex 11/26/19 negative DVT Also pain / ? Neuropathy in legs  Also with more dyspnea   ***   Past Medical History:  Diagnosis Date   Acute respiratory failure ([place]) 03/01/2018   Arthritis    "all over; mostly knees and back" (02/28/2018)   CHF (congestive heart failure) (H[facility])    not on any meds   Chronic lower back pain    stenosis   Community acquired pneumonia 09/06/2013   COPD (chronic obstructive pulmonary disease) ([place])    Coronary atherosclerosis of native coronary artery 2005   s/p surgery   Drug abuse ([place])    hx; tested for cocaine as recently as 2/08. says he is not using drugs now - avoided defib. for this reason    ESRD (end stage renal disease) ([place])    Hemo M-W-F- Richarda Blade   GERD (gastroesophageal reflux disease)    takes OTC meds as needed   GI bleeding 02/06/2019   Glaucoma    uses eye drops  daily   Hepatitis B 1968   "tx'd w/isolation; caught it from toilet stools in gym"   History of blood transfusion 03/01/2019   History of colon polyps    benign   History of gout    takes Allopurinol daily as well as Colchicine-if needed (02/28/2018)   History of kidney stones    HTN (hypertension)    takes Coreg,Imdur.and Apresoline daily   Human immunodeficiency virus (HIV) disease ([place]) dx'd 1995   takes Genvoya daily   Hyperlipidemia    takes Atorvastatin daily   Ischemic cardiomyopathy    Muscle spasm    takes Zanaflex as needed   Myocardial infarction ([place]) ~ 2004/2005   Nocturia    Peripheral neuropathy    takes gabapentin daily   Pneumonia    "at least twice" (02/28/2018)   Shortness of breath dyspnea    rarely but if notices it then with exertion   Syphilis, unspecified    Type II diabetes mellitus ([place]) 2004   Lantus daily.Average fasting blood sugar 125-199   Wears glasses    Wears partial dentures     Past Surgical History:  Procedure Laterality Date   AV FISTULA PLACEMENT Left 08/02/2018   Procedure: ARTERIOVENOUS (AV) FISTULA CREATION  left arm radiocephlic;  Surgeon: Marty Heck, MD;  Location: [facility];  Service: Vascular;  Laterality: Left;   AV FISTULA PLACEMENT Left 08/01/2019   Procedure: LEFT BRACHIOCEPHALIC ARTERIOVENOUS (AV) FISTULA CREATION;  Surgeon: [name], MD;  Location: [facility];  Service: Vascular;  Laterality: Left;   [place] Left 10/03/2019   Procedure: BASILIC VEIN TRANSPOSITION LEFT SECOND STAGE;  Surgeon: [name], MD;  Location: [facility] OR;  Service: Vascular;  Laterality: Left;   CARDIAC CATHETERIZATION  10/2002; 12/19/2004   [name] 03/08/2011   COLONOSCOPY  2013   [place]    CORONARY ARTERY BYPASS GRAFT  02/24/2003   CABG X2/notes 03/08/2011   ESOPHAGOGASTRODUODENOSCOPY (EGD) WITH PROPOFOL N/A 02/08/2019   Procedure: ESOPHAGOGASTRODUODENOSCOPY (EGD) WITH PROPOFOL;  Surgeon:  Milus Banister, MD;  Location: [facility];  Service: Gastroenterology;  Laterality: N/A;   HOT HEMOSTASIS N/A 02/08/2019   Procedure: HOT HEMOSTASIS (ARGON PLASMA COAGULATION/BICAP);  Surgeon: Milus Banister, MD;  Location: [facility] ENDOSCOPY;  Service: Gastroenterology;  Laterality: N/A;   INTERTROCHANTERIC HIP FRACTURE SURGERY Left 11/2006   [name] 03/08/2011   IR FLUORO GUIDE CV LINE RIGHT  07/24/2019   IR FLUORO GUIDE CV LINE RIGHT  07/30/2019   IR US GUIDE VASC ACCESS RIGHT  07/24/2019   IR US GUIDE VASC ACCESS RIGHT  07/30/2019   LAPAROSCOPIC CHOLECYSTECTOMY  05/2006   LIGATION OF COMPETING BRANCHES OF ARTERIOVENOUS FISTULA Left 11/05/2018   Procedure: LIGATION OF COMPETING BRANCHES OF ARTERIOVENOUS FISTULA  LEFT  ARM;  Surgeon: Marty Heck, MD;  Location: [facility] OR;  Service: Vascular;  Laterality: Left;   LUMBAR LAMINECTOMY/DECOMPRESSION MICRODISCECTOMY N/A 02/29/2016   Procedure: Left L4-5 Lateral Recess Decompression, Removal Extradural Intraspinal Facet Cyst;  Surgeon: Marybelle Killings, MD;  Location: [facility];  Service: Orthopedics;  Laterality: N/A;   MULTIPLE TOOTH EXTRACTIONS     ORIF MANDIBULAR FRACTURE Left 08/13/2004   ORIF of left body fracture mandible with KLS Martin 2.3-mm six hole/notes 03/08/2011     Current Outpatient Medications  Medication Sig Dispense Refill   acetaminophen (TYLENOL) 500 MG tablet Take 500 mg by mouth every 6 (six) hours as needed.     allopurinol (ZYLOPRIM) 100 MG tablet TAKE 1 TABLET(100 MG) BY MOUTH DAILY 90 tablet 1   ANORO ELLIPTA 62.5-25 MCG/INH AEPB INHALE 1 PUFF BY MOUTH EVERY DAY 60 each 5   aspirin EC 81 MG tablet Take 81 mg by mouth daily.     atorvastatin (LIPITOR) 80 MG tablet Take 1 tablet (80 mg total) by mouth daily. 90 tablet 3   B-D UF III MINI PEN NEEDLES 31G X 5 MM MISC USE FOUR TIMES DAILY 100 each 11   BIKTARVY 50-200-25 MG TABS tablet TAKE 1 TABLET BY MOUTH DAILY 30 tablet 5   calcitRIOL (ROCALTROL) 0.25 MCG  capsule Take 0.25 mcg by mouth daily.      Choline Fenofibrate (FENOFIBRIC ACID) 135 MG CPDR TAKE 1 CAPSULE BY MOUTH DAILY 90 capsule 1   colchicine 0.6 MG tablet Take 0.6 mg by mouth daily as needed (pain).     gabapentin (NEURONTIN) 300 MG capsule Take 1 capsule (300 mg total) by mouth daily.     glucose blood test strip Use to test blood sugar three times daily. E11.40 300 each 3   insulin lispro (HUMALOG) 100 UNIT/ML injection Inject 20 Units into the skin daily as needed for high blood sugar (above 150).      isosorbide mononitrate (IMDUR) 30 MG  24 hr tablet TAKE 1 TABLET(30 MG) BY MOUTH DAILY 90 tablet 1   Lancets (ONETOUCH DELICA PLUS [id number]) MISC Inject 1 Device as directed 3 (three) times daily. Dx: E11.40 300 each 3   latanoprost (XALATAN) 0.005 % ophthalmic solution Place 1 drop into both eyes at bedtime.     Melatonin 3 MG TABS Take 1 tablet (3 mg total) by mouth at bedtime. (Patient taking differently: Take 6 mg by mouth at bedtime. ) 30 tablet 3   multivitamin (RENA-VIT) TABS tablet Take 1 tablet by mouth daily.     nitroGLYCERIN (NITROSTAT) 0.3 MG SL tablet ONE TABLET UNDER TONGUE AS NEEDED FOR CHEST PAIN 25 tablet 4   pantoprazole (PROTONIX) 40 MG tablet TAKE 1 TABLET(40 MG) BY MOUTH DAILY 90 tablet 1   sevelamer carbonate (RENVELA) 800 MG tablet Take 800 mg by mouth 3 (three) times daily with meals.     simethicone (MYLICON) 80 MG chewable tablet Chew 1 tablet (80 mg total) by mouth 4 (four) times daily as needed for flatulence. 90 tablet 3   timolol (TIMOPTIC) 0.5 % ophthalmic solution Place 1 drop into both eyes every morning.     No current facility-administered medications for this visit.    Allergies:   Augmentin [amoxicillin-pot clavulanate] and Amphetamines    Social History:  The patient  reports that he has been smoking cigarettes. He has a 21.50 pack-year smoking history. He has never used smokeless tobacco. He reports previous alcohol use of about  12.0 standard drinks of alcohol per week. He reports that he does not use drugs.   Family History:  The patient's family history includes Alzheimer's disease in his mother and sister; Diabetes in his brother, brother, and sister; Drug abuse in his brother; Heart attack in his brother; Heart failure in his father; Hypertension in his brother and father; Stroke in his sister.    ROS:  General:no colds or fevers,  weight down 3 lbs by our scales but at home back to baseline Skin:no rashes or ulcers HEENT:no blurred vision, no congestion CV:see HPI PUL:see HPI GI:no diarrhea constipation or melena, no indigestion GU:no hematuria, no dysuria MS:no joint pain, no claudication Neuro:no syncope, no lightheadedness Endo:+ diabetes glucose stable, no thyroid disease  Wt Readings from Last 3 Encounters:  11/19/19 190 lb (86.2 kg)  11/07/19 189 lb (85.7 kg)  10/29/19 198 lb (89.8 kg)     PHYSICAL EXAM: VS:  There were no vitals taken for this visit. , BMI There is no height or weight on file to calculate BMI. Affect appropriate Chronically ill black male  HEENT: normal Neck supple with no adenopathy JVP normal no bruits no thyromegaly Lungs clear with no wheezing and good diaphragmatic motion Heart:  S1/S2 no murmur, no rub, gallop or click PMI normal Abdomen: benighn, BS positve, no tenderness, no AAA no bruit.  No HSM or HJR Distal pulses intact with no bruits Plus 2 LE edema Neuro non-focal Skin warm and dry No muscular weakness Left arm fistual from wrist to brachial thrill present     EKG:   10/21/17 SR rate 77 PVC possible old IMI lateral T wave inversion    Recent Labs: 07/21/2019: B Natriuretic Peptide 1,535.2; Magnesium 1.8 10/21/2019: ALT 214 11/09/2019: BUN 61; Creatinine 6.3; Hemoglobin 7.9; Platelets 150; Potassium 4.6; Sodium 137    Lipid Panel    Component Value Date/Time   CHOL 158 06/04/2019 1019   CHOL 121 08/07/2015 1041   TRIG 574 (H) 06/04/2019 1019  HDL 19 (L) 06/04/2019 1019   HDL 32 (L) 08/07/2015 1041   CHOLHDL 8.3 (H) 06/04/2019 1019   VLDL 48 (H) 03/15/2017 0924   LDLCALC  06/04/2019 1019     Comment:     . LDL cholesterol not calculated. Triglyceride levels greater than 400 mg/dL invalidate calculated LDL results. . Reference range: <100 . Desirable range <100 mg/dL for primary prevention;   <70 mg/dL for patients with CHD or diabetic patients  with > or = 2 CHD risk factors. [name] LDL-C is now calculated using the Martin-Hopkins  calculation, which is a validated novel method providing  better accuracy than the Friedewald equation in the  estimation of LDL-C.  Cresenciano Genre et al. Annamaria Helling. 1448;185(63): 2061-2068  (http://education.[employer].com/faq/FAQ164)        Other studies Reviewed: Additional studies/ records that were reviewed today include: recent OV note.  Echo: 02/07/19 EF 40-45% severe LAE trivial MR Venous Duplex LE;s no DVT 12/03/19 LE ABI's 12/05/19 ***  ASSESSMENT AND PLAN:  1.  Acute on chronic systolic and diastolic HF this is related to CRF  EF 40-45% echo April 2020  Now on dialysis ***  2. CAD no angina, Distant CABG 2004.  Myovue 12/13/18 high risk due to EF 26% large inferior wall infarct Troponin elevated recent hospitalization ? [place] with Hb 6 and fistula with higher output  Now that he is on dialysis And having more edema and dyspnea discussed having right and left heart cath ***   3. DM:  Discussed low carb diet.  Target hemoglobin A1c is 6.5 or less.  Continue current medications.  4. CRF:  On dialysis fistula revised 10/03/19 by Dr Early follows with Dr Upon ***  5. Cholesterol:  On statin labs with primary Triglycerides  up due to poor BS control   6. HTN:  Well controlled.  Continue current medications and low sodium Dash type diet.    F/U post cath ***  Jenkins Rouge, MD

## 2019-12-05 ENCOUNTER — Other Ambulatory Visit: Payer: Self-pay

## 2019-12-05 ENCOUNTER — Ambulatory Visit (HOSPITAL_COMMUNITY)
Admission: RE | Admit: 2019-12-05 | Discharge: 2019-12-05 | Disposition: A | Discharge status: Home / Self Care | Payer: Medicare Other | Source: Ambulatory Visit | Attending: Cardiovascular Disease | Admitting: Cardiovascular Disease

## 2019-12-05 DIAGNOSIS — R600 Localized edema: Secondary | ICD-10-CM | POA: Diagnosis present

## 2019-12-05 DIAGNOSIS — M79605 Pain in left leg: Secondary | ICD-10-CM | POA: Diagnosis present

## 2019-12-05 DIAGNOSIS — M79604 Pain in right leg: Secondary | ICD-10-CM | POA: Insufficient documentation

## 2019-12-10 ENCOUNTER — Telehealth: Payer: Self-pay | Admitting: *Deleted

## 2019-12-10 NOTE — Telephone Encounter (Signed)
Patient called and stated that the Melatonin 3mg  is not working. Wants to increase. Told patient to Increase to 6mg  per last ov note per Dinah.  Patient agreed and will try.    Per Dinah: OV Note Dated 11/19/19 5. Insomnia, unspecified type Will try melatonin 3 mg tablet will  increase to 6 mg tablet at bedtime if not effective.  - Melatonin 3 MG TABS; Take 1 tablet (3 mg total) by mouth at bedtime.  Dispense: 30 tablet; Refill: 3

## 2019-12-11 ENCOUNTER — Telehealth: Payer: Self-pay

## 2019-12-11 ENCOUNTER — Other Ambulatory Visit: Payer: Self-pay | Admitting: Nurse Practitioner

## 2019-12-11 DIAGNOSIS — J449 Chronic obstructive pulmonary disease, unspecified: Secondary | ICD-10-CM

## 2019-12-11 NOTE — Telephone Encounter (Signed)
Virtual Visit Pre-Appointment Phone Call  "(Name), I am calling you today to discuss your upcoming appointment. We are currently trying to limit exposure to the virus that causes COVID-19 by seeing patients at home rather than in the office."  1. "What is the BEST phone number to call the day of the visit?" - include this in appointment notes  2. "Do you have or have access to (through a family member/friend) a smartphone with video capability that we can use for your visit?" a. If yes - list this number in appt notes as "cell" (if different from BEST phone #) and list the appointment type as a VIDEO visit in appointment notes b. If no - list the appointment type as a PHONE visit in appointment notes  3. Confirm consent - "In the setting of the current Covid19 crisis, you are scheduled for a (phone or video) visit with your provider on (date) at (time).  Just as we do with many in-office visits, in order for you to participate in this visit, we must obtain consent.  If you'd like, I can send this to your mychart (if signed up) or email for you to review.  Otherwise, I can obtain your verbal consent now.  All virtual visits are billed to your insurance company just like a normal visit would be.  By agreeing to a virtual visit, we'd like you to understand that the technology does not allow for your provider to perform an examination, and thus may limit your provider's ability to fully assess your condition. If your provider identifies any concerns that need to be evaluated in person, we will make arrangements to do so.  Finally, though the technology is pretty good, we cannot assure that it will always work on either your or our end, and in the setting of a video visit, we may have to convert it to a phone-only visit.  In either situation, we cannot ensure that we have a secure connection.  Are you willing to proceed?" YES  4. Advise patient to be prepared - "Two hours prior to your appointment, go  ahead and check your blood pressure, pulse, oxygen saturation, and your weight (if you have the equipment to check those) and write them all down. When your visit starts, your provider will ask you for this information. If you have an Apple Watch or Kardia device, please plan to have heart rate information ready on the day of your appointment. Please have a pen and paper handy nearby the day of the visit as well."  5. Give patient instructions for MyChart download to smartphone OR Doximity/Doxy.me as below if video visit (depending on what platform provider is using)  6. Inform patient they will receive a phone call 15 minutes prior to their appointment time (may be from unknown caller ID) so they should be prepared to answer    TELEPHONE CALL NOTE  Jacob [name] has been deemed a candidate for a follow-up tele-health visit to limit community exposure during the Covid-19 pandemic. I spoke with the patient via phone to ensure availability of phone/video source, confirm preferred email & phone number, and discuss instructions and expectations.  I reminded Jacob [name] to be prepared with any vital sign and/or heart rhythm information that could potentially be obtained via home monitoring, at the time of his visit. I reminded Jacob [name] to expect a phone call prior to his visit.  Jacob Poe [place], RN 12/11/2019 4:33 PM   INSTRUCTIONS  FOR DOWNLOADING THE MYCHART APP TO SMARTPHONE  - The patient must first make sure to have activated MyChart and know their login information - If Apple, go to [employer] and type in MyChart in the search bar and download the app. If Android, ask patient to go to [employer] and type in [place] in the search bar and download the app. The app is free but as with any other app downloads, their phone may require them to verify saved payment information or Apple/Android password.  - The patient will need to then log into the app with their MyChart username  and password, and select [facility] as their healthcare provider to link the account. When it is time for your visit, go to the MyChart app, find appointments, and click Begin Video Visit. Be sure to Select Allow for your device to access the Microphone and Camera for your visit. You will then be connected, and your provider will be with you shortly.  **If they have any issues connecting, or need assistance please contact MyChart service desk [phone])**  **If using a computer, in order to ensure the best quality for their visit they will need to use either of the following Internet Browsers: [employer], or Google Chrome**  IF USING DOXIMITY or DOXY.ME - The patient will receive a link just prior to their visit by text.     FULL LENGTH CONSENT FOR TELE-HEALTH VISIT   I hereby voluntarily request, consent and authorize [facility] and its employed or contracted physicians, physician assistants, nurse practitioners or other licensed health care professionals (the Practitioner), to provide me with telemedicine health care services (the "Services") as deemed necessary by the treating Practitioner. I acknowledge and consent to receive the Services by the Practitioner via telemedicine. I understand that the telemedicine visit will involve communicating with the Practitioner through live audiovisual communication technology and the disclosure of certain medical information by electronic transmission. I acknowledge that I have been given the opportunity to request an in-person assessment or other available alternative prior to the telemedicine visit and am voluntarily participating in the telemedicine visit.  I understand that I have the right to withhold or withdraw my consent to the use of telemedicine in the course of my care at any time, without affecting my right to future care or treatment, and that the Practitioner or I may terminate the telemedicine visit at any time. I  understand that I have the right to inspect all information obtained and/or recorded in the course of the telemedicine visit and may receive copies of available information for a reasonable fee.  I understand that some of the potential risks of receiving the Services via telemedicine include:  [name] Delay or interruption in medical evaluation due to technological equipment failure or disruption; . Information transmitted may not be sufficient (e.g. poor resolution of images) to allow for appropriate medical decision making by the Practitioner; and/or  . In rare instances, security protocols could fail, causing a breach of personal health information.  Furthermore, I acknowledge that it is my responsibility to provide information about my medical history, conditions and care that is complete and accurate to the best of my ability. I acknowledge that Practitioner's advice, recommendations, and/or decision may be based on factors not within their control, such as incomplete or inaccurate data provided by me or distortions of diagnostic images or specimens that may result from electronic transmissions. I understand that the practice of medicine is not an exact science and  that Practitioner makes no warranties or guarantees regarding treatment outcomes. I acknowledge that I will receive a copy of this consent concurrently upon execution via email to the email address I last provided but may also request a printed copy by calling the office of [facility].    I understand that my insurance will be billed for this visit.   I have read or had this consent read to me. . I understand the contents of this consent, which adequately explains the benefits and risks of the Services being provided via telemedicine.  . I have been provided ample opportunity to ask questions regarding this consent and the Services and have had my questions answered to my satisfaction. . I give my informed consent for the services to be  provided through the use of telemedicine in my medical care  By participating in this telemedicine visit I agree to the above.

## 2019-12-11 NOTE — Telephone Encounter (Signed)
Left message for patient to call back  

## 2019-12-12 ENCOUNTER — Telehealth: Payer: Medicare Other | Admitting: Cardiovascular Disease

## 2019-12-12 ENCOUNTER — Encounter: Payer: Self-pay | Admitting: Cardiovascular Disease

## 2019-12-12 ENCOUNTER — Telehealth (INDEPENDENT_AMBULATORY_CARE_PROVIDER_SITE_OTHER): Payer: Medicare Other | Admitting: Cardiovascular Disease

## 2019-12-12 ENCOUNTER — Other Ambulatory Visit: Payer: Self-pay | Admitting: Cardiovascular Disease

## 2019-12-12 ENCOUNTER — Other Ambulatory Visit: Payer: Self-pay

## 2019-12-12 VITALS — Ht 74.0 in | Wt 190.0 lb

## 2019-12-12 DIAGNOSIS — E1122 Type 2 diabetes mellitus with diabetic chronic kidney disease: Secondary | ICD-10-CM | POA: Diagnosis not present

## 2019-12-12 DIAGNOSIS — E785 Hyperlipidemia, unspecified: Secondary | ICD-10-CM

## 2019-12-12 DIAGNOSIS — N189 Chronic kidney disease, unspecified: Secondary | ICD-10-CM

## 2019-12-12 DIAGNOSIS — I255 Ischemic cardiomyopathy: Secondary | ICD-10-CM

## 2019-12-12 DIAGNOSIS — I130 Hypertensive heart and chronic kidney disease with heart failure and stage 1 through stage 4 chronic kidney disease, or unspecified chronic kidney disease: Secondary | ICD-10-CM | POA: Diagnosis not present

## 2019-12-12 DIAGNOSIS — Z951 Presence of aortocoronary bypass graft: Secondary | ICD-10-CM

## 2019-12-12 DIAGNOSIS — I2510 Atherosclerotic heart disease of native coronary artery without angina pectoris: Secondary | ICD-10-CM

## 2019-12-12 DIAGNOSIS — I5043 Acute on chronic combined systolic (congestive) and diastolic (congestive) heart failure: Secondary | ICD-10-CM

## 2019-12-12 DIAGNOSIS — I5022 Chronic systolic (congestive) heart failure: Secondary | ICD-10-CM

## 2019-12-12 MED ORDER — SODIUM CHLORIDE 0.9% FLUSH: 3.0000 mL | Freq: Two times a day (BID) | INTRAVENOUS | Status: DC

## 2019-12-12 NOTE — H&P (View-Only) (Signed)
Virtual Visit via Video Note   This visit type was conducted due to national recommendations for restrictions regarding the COVID-19 Pandemic (e.g. social distancing) in an effort to limit this patient's exposure and mitigate transmission in our community.  Due to her co-morbid illnesses, this patient is at least at moderate risk for complications without adequate follow up.  This format is felt to be most appropriate for this patient at this time.  All issues noted in this document were discussed and addressed.  A limited physical exam was performed with this format.  Please refer to the patient's chart for her consent to telehealth for [facility].   Physician Location : Office Patient Location: Home     Date:  12/12/2019   ID:  Jacob [name], DOB Mar 19, 1951, MRN [id number]  PCP:  [name], NP  Cardiologist:  Dr. [name]        History of Present Illness:  69 y.o. with distant CABG in 2004 with EF improving from 24% to 45-50% after revascularization. Last myovue 2016 with scar no ischemia. Has done well with medical Rx and cath avoided due to CRF Now on dialysis  Followed by Dr [name] Had revision of left arm fistula January 2019  Comorbid conditions include HIV previous drug abuse HTN and HLD  No angina.   Echo 02/07/19 EF 40-45% Severe LAE no significant valve disease   Myovue 12/13/18 abnormal EF 26% large inferior wall infarct   Cr 5.2 03/08/19  Hct 25.8 transfused 02/11/19  EGD AVM;s Rx did not have colonoscopy Iron Rx given   Needs dialysis Fistula placed August 02 2018 Dr Carlis Abbott   Dr [name] has increased his lasix Still anemia with dyspnea Getting iron infusions.  BS poorly controlled eating too much bread and popsicles  A1c 9.2 Triglycerides 574  Drinking to much [place] wine   Called office with more edema and dyspnea 11/26/19  Discussed need for right and left heart cath now that he is on dialysis  Had fistula revised with 2nd stage basilic vein transposition  left arm Dr Donnetta Hutching 10/03/19  LE venous duplex 12/03/19 no DVT LE arterial duplex 12/05/19 no PVD  Been on dialysis since October M/WF still making urine weight is done a lot  Edema is worse on standing He indicates that Dr Dederding following his kidneys Now and did not want to add oral lasix   Risks of cath including stroke , MI, worsening urine output, need for emergency surgery and bleeding discussed Willing to proceed. Will need to be done from leg given fistula in left arm and grafts   He has had negative COVID test December and has had vaccine    Past Medical History:  Diagnosis Date  . Acute respiratory failure ([place]) 03/01/2018  . Arthritis    "all over; mostly knees and back" (02/28/2018)  . CHF (congestive heart failure) ([facility])    not on any meds  . Chronic lower back pain    stenosis  . Community acquired pneumonia 09/06/2013  . COPD (chronic obstructive pulmonary disease) ([place])   . Coronary atherosclerosis of native coronary artery 2005   s/p surgery  . Drug abuse ([place])    hx; tested for cocaine as recently as 2/08. says he is not using drugs now - avoided defib. for this reason   . ESRD (end stage renal disease) ([place])    Hemo M-W-F- Richarda Blade  . GERD (gastroesophageal reflux disease)    takes OTC meds as needed  .  GI bleeding 02/06/2019  . Glaucoma    uses eye drops daily  . Hepatitis B 1968   "tx'd w/isolation; caught it from toilet stools in gym"  . History of blood transfusion 03/01/2019  . History of colon polyps    benign  . History of gout    takes Allopurinol daily as well as Colchicine-if needed (02/28/2018)  . History of kidney stones   . HTN (hypertension)    takes Coreg,Imdur.and Apresoline daily  . Human immunodeficiency virus (HIV) disease ([place]) dx'd 1995   takes Genvoya daily  . Hyperlipidemia    takes Atorvastatin daily  . Ischemic cardiomyopathy   . Muscle spasm    takes Zanaflex as needed  . Myocardial infarction ([place]) ~ 2004/2005  . Nocturia   .  Peripheral neuropathy    takes gabapentin daily  . Pneumonia    "at least twice" (02/28/2018)  . Shortness of breath dyspnea    rarely but if notices it then with exertion  . Syphilis, unspecified   . Type II diabetes mellitus ([place]) 2004   Lantus daily.Average fasting blood sugar 125-199  . Wears glasses   . Wears partial dentures     Past Surgical History:  Procedure Laterality Date  . AV FISTULA PLACEMENT Left 08/02/2018   Procedure: ARTERIOVENOUS (AV) FISTULA CREATION  left arm radiocephlic;  Surgeon: Marty Heck, MD;  Location: [place];  Service: Vascular;  Laterality: Left;  . AV FISTULA PLACEMENT Left 08/01/2019   Procedure: LEFT BRACHIOCEPHALIC ARTERIOVENOUS (AV) FISTULA CREATION;  Surgeon: [name], MD;  Location: [place];  Service: Vascular;  Laterality: Left;  . BASCILIC VEIN TRANSPOSITION Left 10/03/2019   Procedure: BASILIC VEIN TRANSPOSITION LEFT SECOND STAGE;  Surgeon: [name], MD;  Location: [place];  Service: Vascular;  Laterality: Left;  . CARDIAC CATHETERIZATION  10/2002; 12/19/2004   [name] 03/08/2011  . COLONOSCOPY  2013   Dr.John Henrene Pastor   . CORONARY ARTERY BYPASS GRAFT  02/24/2003   CABG X2/notes 03/08/2011  . ESOPHAGOGASTRODUODENOSCOPY (EGD) WITH PROPOFOL N/A 02/08/2019   Procedure: ESOPHAGOGASTRODUODENOSCOPY (EGD) WITH PROPOFOL;  Surgeon: Milus Banister, MD;  Location: [place];  Service: Gastroenterology;  Laterality: N/A;  . HOT HEMOSTASIS N/A 02/08/2019   Procedure: HOT HEMOSTASIS (ARGON PLASMA COAGULATION/BICAP);  Surgeon: Milus Banister, MD;  Location: [facility];  Service: Gastroenterology;  Laterality: N/A;  . INTERTROCHANTERIC HIP FRACTURE SURGERY Left 11/2006   [name] 03/08/2011  . IR FLUORO GUIDE CV LINE RIGHT  07/24/2019  . IR FLUORO GUIDE CV LINE RIGHT  07/30/2019  . IR US GUIDE VASC ACCESS RIGHT  07/24/2019  . IR US GUIDE VASC ACCESS RIGHT  07/30/2019  . LAPAROSCOPIC CHOLECYSTECTOMY  05/2006  . LIGATION OF COMPETING BRANCHES OF  ARTERIOVENOUS FISTULA Left 11/05/2018   Procedure: LIGATION OF COMPETING BRANCHES OF ARTERIOVENOUS FISTULA  LEFT  ARM;  Surgeon: Marty Heck, MD;  Location: [place];  Service: Vascular;  Laterality: Left;  . LUMBAR LAMINECTOMY/DECOMPRESSION MICRODISCECTOMY N/A 02/29/2016   Procedure: Left L4-5 Lateral Recess Decompression, Removal Extradural Intraspinal Facet Cyst;  Surgeon: Marybelle Killings, MD;  Location: [place];  Service: Orthopedics;  Laterality: N/A;  . MULTIPLE TOOTH EXTRACTIONS    . ORIF MANDIBULAR FRACTURE Left 08/13/2004   ORIF of left body fracture mandible with KLS Martin 2.3-mm six hole/notes 03/08/2011     Current Outpatient Medications  Medication Sig Dispense Refill  . acetaminophen (TYLENOL) 500 MG tablet Take 500 mg by mouth every 6 (six) hours as needed.    [name]  allopurinol (ZYLOPRIM) 100 MG tablet TAKE 1 TABLET(100 MG) BY MOUTH DAILY 90 tablet 1  . ANORO ELLIPTA 62.5-25 MCG/INH AEPB INHALE 1 PUFF BY MOUTH EVERY DAY 60 each 5  . aspirin EC 81 MG tablet Take 81 mg by mouth daily.    [name] atorvastatin (LIPITOR) 80 MG tablet Take 1 tablet (80 mg total) by mouth daily. 90 tablet 3  . B-D UF III MINI PEN NEEDLES 31G X 5 MM MISC USE FOUR TIMES DAILY 100 each 11  . BIKTARVY 50-200-25 MG TABS tablet TAKE 1 TABLET BY MOUTH DAILY 30 tablet 5  . Choline Fenofibrate (FENOFIBRIC ACID) 135 MG CPDR TAKE 1 CAPSULE BY MOUTH DAILY 90 capsule 1  . colchicine 0.6 MG tablet Take 0.6 mg by mouth daily as needed (pain).    [name] gabapentin (NEURONTIN) 300 MG capsule Take 1 capsule (300 mg total) by mouth daily.    [name] glucose blood test strip Use to test blood sugar three times daily. E11.40 300 each 3  . insulin lispro (HUMALOG) 100 UNIT/ML injection Inject 20 Units into the skin daily as needed for high blood sugar (above 150).     . isosorbide mononitrate (IMDUR) 30 MG 24 hr tablet TAKE 1 TABLET(30 MG) BY MOUTH DAILY 90 tablet 1  . Lancets (ONETOUCH DELICA PLUS [id number]) MISC Inject 1 Device as directed 3  (three) times daily. Dx: E11.40 300 each 3  . latanoprost (XALATAN) 0.005 % ophthalmic solution Place 1 drop into both eyes at bedtime.    . Melatonin 3 MG TABS Take 1 tablet (3 mg total) by mouth at bedtime. (Patient taking differently: Take 6 mg by mouth at bedtime. ) 30 tablet 3  . multivitamin (RENA-VIT) TABS tablet Take 1 tablet by mouth daily.    . nitroGLYCERIN (NITROSTAT) 0.3 MG SL tablet ONE TABLET UNDER TONGUE AS NEEDED FOR CHEST PAIN 25 tablet 4  . pantoprazole (PROTONIX) 40 MG tablet TAKE 1 TABLET(40 MG) BY MOUTH DAILY 90 tablet 1  . sevelamer carbonate (RENVELA) 800 MG tablet Take 800 mg by mouth 3 (three) times daily with meals.    . simethicone (MYLICON) 80 MG chewable tablet Chew 1 tablet (80 mg total) by mouth 4 (four) times daily as needed for flatulence. 90 tablet 3  . timolol (TIMOPTIC) 0.5 % ophthalmic solution Place 1 drop into both eyes every morning.     No current facility-administered medications for this visit.    Allergies:   Augmentin [amoxicillin-pot clavulanate] and Amphetamines    Social History:  The patient  reports that he has been smoking cigarettes. He has a 21.50 pack-year smoking history. He has never used smokeless tobacco. He reports previous alcohol use of about 12.0 standard drinks of alcohol per week. He reports that he does not use drugs.   Family History:  The patient's family history includes Alzheimer's disease in his mother and sister; Diabetes in his brother, brother, and sister; Drug abuse in his brother; Heart attack in his brother; Heart failure in his father; Hypertension in his brother and father; Stroke in his sister.    ROS:  General:no colds or fevers,  weight down 3 lbs by our scales but at home back to baseline Skin:no rashes or ulcers HEENT:no blurred vision, no congestion CV:see HPI PUL:see HPI GI:no diarrhea constipation or melena, no indigestion GU:no hematuria, no dysuria MS:no joint pain, no claudication Neuro:no syncope,  no lightheadedness Endo:+ diabetes glucose stable, no thyroid disease  Wt Readings from Last 3 Encounters:  12/12/19  190 lb (86.2 kg)  11/19/19 190 lb (86.2 kg)  11/07/19 189 lb (85.7 kg)     PHYSICAL EXAM: VS:  Ht 6\' 2"  (1.88 m)   Wt 190 lb (86.2 kg)   BMI 24.39 kg/m  , BMI Body mass index is 24.39 kg/m.   Telephone no exam    EKG:   10/21/17 SR rate 77 PVC possible old IMI lateral T wave inversion    Recent Labs: 07/21/2019: B Natriuretic Peptide 1,535.2; Magnesium 1.8 10/21/2019: ALT 214 11/09/2019: BUN 61; Creatinine 6.3; Hemoglobin 7.9; Platelets 150; Potassium 4.6; Sodium 137    Lipid Panel    Component Value Date/Time   CHOL 158 06/04/2019 1019   CHOL 121 08/07/2015 1041   TRIG 574 (H) 06/04/2019 1019   HDL 19 (L) 06/04/2019 1019   HDL 32 (L) 08/07/2015 1041   CHOLHDL 8.3 (H) 06/04/2019 1019   VLDL 48 (H) 03/15/2017 0924   LDLCALC  06/04/2019 1019     Comment:     . LDL cholesterol not calculated. Triglyceride levels greater than 400 mg/dL invalidate calculated LDL results. . Reference range: <100 . Desirable range <100 mg/dL for primary prevention;   <70 mg/dL for patients with CHD or diabetic patients  with > or = 2 CHD risk factors. [name] LDL-C is now calculated using the Martin-Hopkins  calculation, which is a validated novel method providing  better accuracy than the Friedewald equation in the  estimation of LDL-C.  Cresenciano Genre et al. Annamaria Helling. 6237;628(31): 2061-2068  (http://education.[employer].com/faq/FAQ164)        Other studies Reviewed: Additional studies/ records that were reviewed today include: recent OV note.  ECHO 11/10/2016 ------------------------------------------------------------------- Study Conclusions  - Left ventricle: Septal and inferior wall hypokinesis The cavity   size was mildly dilated. There was moderate concentric   hypertrophy. Systolic function was mildly reduced. The estimated   ejection fraction was in the  range of 45% to 50%. Doppler   parameters are consistent with both elevated ventricular   end-diastolic filling pressure and elevated left atrial filling   pressure. - Left atrium: The atrium was mildly dilated. - Atrial septum: No defect or patent foramen ovale was identified.  ASSESSMENT AND PLAN:  1.  Acute on chronic systolic and diastolic HF this is related to CRF  EF 40-45% echo April 2020  Worsening dyspnea and edema ? CHF will arrange right and left heart cath next week on Tuesday or Thursday after a dialysis day. Lab work and COVID testing 24-48 hours before  Will then have him f/u with CHF clinic post cath   2. CAD no angina, Distant CABG 2004.  Myovue 12/13/18 high risk due to EF 26% large inferior wall infarct See above regarding cath   3. DM:  Discussed low carb diet.  Target hemoglobin A1c is 6.5 or less.  Continue current medications.  4. CRF:  On dialysis since October he indicates weight is down despite worse dyspnea and edema F/U renal ? Add oral lasix since not anuric   5. Cholesterol:  On statin labs with primary Triglycerides  up due to poor BS control   6. HTN:  Well controlled.  Continue current medications and low sodium Dash type diet.    Pre cath labs Right and left cath next week F/U CHF clinic post cath F/U with me in 6 months   Time spent reviewing chart LE duplex exams, direct patient interview and arranging cath with CHF clinic f/u 30 minutes   Jenkins Rouge, MD

## 2019-12-12 NOTE — Progress Notes (Signed)
Virtual Visit via Video Note   This visit type was conducted due to national recommendations for restrictions regarding the COVID-19 Pandemic (e.g. social distancing) in an effort to limit this patient's exposure and mitigate transmission in our community.  Due to her co-morbid illnesses, this patient is at least at moderate risk for complications without adequate follow up.  This format is felt to be most appropriate for this patient at this time.  All issues noted in this document were discussed and addressed.  A limited physical exam was performed with this format.  Please refer to the patient's chart for her consent to telehealth for [facility].   Physician Location : Office Patient Location: Home     Date:  12/12/2019   ID:  Jacob [name], DOB 11/07/1950, MRN [id number]  PCP:  [name], NP  Cardiologist:  Dr. [name]        History of Present Illness:  69 y.o. with distant CABG in 2004 with EF improving from 24% to 45-50% after revascularization. Last myovue 2016 with scar no ischemia. Has done well with medical Rx and cath avoided due to CRF Now on dialysis  Followed by Dr [name] Had revision of left arm fistula January 2019  Comorbid conditions include HIV previous drug abuse HTN and HLD  No angina.   Echo 02/07/19 EF 40-45% Severe LAE no significant valve disease   Myovue 12/13/18 abnormal EF 26% large inferior wall infarct   Cr 5.2 03/08/19  Hct 25.8 transfused 02/11/19  EGD AVM;s Rx did not have colonoscopy Iron Rx given   Needs dialysis Fistula placed August 02 2018 Dr Carlis Abbott   Dr [name] has increased his lasix Still anemia with dyspnea Getting iron infusions.  BS poorly controlled eating too much bread and popsicles  A1c 9.2 Triglycerides 574  Drinking to much [place] wine   Called office with more edema and dyspnea 11/26/19  Discussed need for right and left heart cath now that he is on dialysis  Had fistula revised with 2nd stage basilic vein transposition  left arm Dr Donnetta Hutching 10/03/19  LE venous duplex 12/03/19 no DVT LE arterial duplex 12/05/19 no PVD  Been on dialysis since October M/WF still making urine weight is done a lot  Edema is worse on standing He indicates that Dr Dederding following his kidneys Now and did not want to add oral lasix   Risks of cath including stroke , MI, worsening urine output, need for emergency surgery and bleeding discussed Willing to proceed. Will need to be done from leg given fistula in left arm and grafts   He has had negative COVID test December and has had vaccine    Past Medical History:  Diagnosis Date  . Acute respiratory failure ([place]) 03/01/2018  . Arthritis    "all over; mostly knees and back" (02/28/2018)  . CHF (congestive heart failure) ([facility])    not on any meds  . Chronic lower back pain    stenosis  . Community acquired pneumonia 09/06/2013  . COPD (chronic obstructive pulmonary disease) ([place])   . Coronary atherosclerosis of native coronary artery 2005   s/p surgery  . Drug abuse ([place])    hx; tested for cocaine as recently as 2/08. says he is not using drugs now - avoided defib. for this reason   . ESRD (end stage renal disease) ([place])    Hemo M-W-F- Richarda Blade  . GERD (gastroesophageal reflux disease)    takes OTC meds as needed  .  GI bleeding 02/06/2019  . Glaucoma    uses eye drops daily  . Hepatitis B 1968   "tx'd w/isolation; caught it from toilet stools in gym"  . History of blood transfusion 03/01/2019  . History of colon polyps    benign  . History of gout    takes Allopurinol daily as well as Colchicine-if needed (02/28/2018)  . History of kidney stones   . HTN (hypertension)    takes Coreg,Imdur.and Apresoline daily  . Human immunodeficiency virus (HIV) disease ([place]) dx'd 1995   takes Genvoya daily  . Hyperlipidemia    takes Atorvastatin daily  . Ischemic cardiomyopathy   . Muscle spasm    takes Zanaflex as needed  . Myocardial infarction ([place]) ~ 2004/2005  . Nocturia   .  Peripheral neuropathy    takes gabapentin daily  . Pneumonia    "at least twice" (02/28/2018)  . Shortness of breath dyspnea    rarely but if notices it then with exertion  . Syphilis, unspecified   . Type II diabetes mellitus ([place]) 2004   Lantus daily.Average fasting blood sugar 125-199  . Wears glasses   . Wears partial dentures     Past Surgical History:  Procedure Laterality Date  . AV FISTULA PLACEMENT Left 08/02/2018   Procedure: ARTERIOVENOUS (AV) FISTULA CREATION  left arm radiocephlic;  Surgeon: Marty Heck, MD;  Location: [place];  Service: Vascular;  Laterality: Left;  . AV FISTULA PLACEMENT Left 08/01/2019   Procedure: LEFT BRACHIOCEPHALIC ARTERIOVENOUS (AV) FISTULA CREATION;  Surgeon: [name], MD;  Location: [place];  Service: Vascular;  Laterality: Left;  . BASCILIC VEIN TRANSPOSITION Left 10/03/2019   Procedure: BASILIC VEIN TRANSPOSITION LEFT SECOND STAGE;  Surgeon: [name], MD;  Location: [place];  Service: Vascular;  Laterality: Left;  . CARDIAC CATHETERIZATION  10/2002; 12/19/2004   [name] 03/08/2011  . COLONOSCOPY  2013   Dr.John Henrene Pastor   . CORONARY ARTERY BYPASS GRAFT  02/24/2003   CABG X2/notes 03/08/2011  . ESOPHAGOGASTRODUODENOSCOPY (EGD) WITH PROPOFOL N/A 02/08/2019   Procedure: ESOPHAGOGASTRODUODENOSCOPY (EGD) WITH PROPOFOL;  Surgeon: Milus Banister, MD;  Location: [place];  Service: Gastroenterology;  Laterality: N/A;  . HOT HEMOSTASIS N/A 02/08/2019   Procedure: HOT HEMOSTASIS (ARGON PLASMA COAGULATION/BICAP);  Surgeon: Milus Banister, MD;  Location: [facility];  Service: Gastroenterology;  Laterality: N/A;  . INTERTROCHANTERIC HIP FRACTURE SURGERY Left 11/2006   [name] 03/08/2011  . IR FLUORO GUIDE CV LINE RIGHT  07/24/2019  . IR FLUORO GUIDE CV LINE RIGHT  07/30/2019  . IR US GUIDE VASC ACCESS RIGHT  07/24/2019  . IR US GUIDE VASC ACCESS RIGHT  07/30/2019  . LAPAROSCOPIC CHOLECYSTECTOMY  05/2006  . LIGATION OF COMPETING BRANCHES OF  ARTERIOVENOUS FISTULA Left 11/05/2018   Procedure: LIGATION OF COMPETING BRANCHES OF ARTERIOVENOUS FISTULA  LEFT  ARM;  Surgeon: Marty Heck, MD;  Location: [place];  Service: Vascular;  Laterality: Left;  . LUMBAR LAMINECTOMY/DECOMPRESSION MICRODISCECTOMY N/A 02/29/2016   Procedure: Left L4-5 Lateral Recess Decompression, Removal Extradural Intraspinal Facet Cyst;  Surgeon: Marybelle Killings, MD;  Location: [place];  Service: Orthopedics;  Laterality: N/A;  . MULTIPLE TOOTH EXTRACTIONS    . ORIF MANDIBULAR FRACTURE Left 08/13/2004   ORIF of left body fracture mandible with KLS Martin 2.3-mm six hole/notes 03/08/2011     Current Outpatient Medications  Medication Sig Dispense Refill  . acetaminophen (TYLENOL) 500 MG tablet Take 500 mg by mouth every 6 (six) hours as needed.    [name]  allopurinol (ZYLOPRIM) 100 MG tablet TAKE 1 TABLET(100 MG) BY MOUTH DAILY 90 tablet 1  . ANORO ELLIPTA 62.5-25 MCG/INH AEPB INHALE 1 PUFF BY MOUTH EVERY DAY 60 each 5  . aspirin EC 81 MG tablet Take 81 mg by mouth daily.    [name] atorvastatin (LIPITOR) 80 MG tablet Take 1 tablet (80 mg total) by mouth daily. 90 tablet 3  . B-D UF III MINI PEN NEEDLES 31G X 5 MM MISC USE FOUR TIMES DAILY 100 each 11  . BIKTARVY 50-200-25 MG TABS tablet TAKE 1 TABLET BY MOUTH DAILY 30 tablet 5  . Choline Fenofibrate (FENOFIBRIC ACID) 135 MG CPDR TAKE 1 CAPSULE BY MOUTH DAILY 90 capsule 1  . colchicine 0.6 MG tablet Take 0.6 mg by mouth daily as needed (pain).    [name] gabapentin (NEURONTIN) 300 MG capsule Take 1 capsule (300 mg total) by mouth daily.    [name] glucose blood test strip Use to test blood sugar three times daily. E11.40 300 each 3  . insulin lispro (HUMALOG) 100 UNIT/ML injection Inject 20 Units into the skin daily as needed for high blood sugar (above 150).     . isosorbide mononitrate (IMDUR) 30 MG 24 hr tablet TAKE 1 TABLET(30 MG) BY MOUTH DAILY 90 tablet 1  . Lancets (ONETOUCH DELICA PLUS EZMOQH47M) MISC Inject 1 Device as directed 3  (three) times daily. Dx: E11.40 300 each 3  . latanoprost (XALATAN) 0.005 % ophthalmic solution Place 1 drop into both eyes at bedtime.    . Melatonin 3 MG TABS Take 1 tablet (3 mg total) by mouth at bedtime. (Patient taking differently: Take 6 mg by mouth at bedtime. ) 30 tablet 3  . multivitamin (RENA-VIT) TABS tablet Take 1 tablet by mouth daily.    . nitroGLYCERIN (NITROSTAT) 0.3 MG SL tablet ONE TABLET UNDER TONGUE AS NEEDED FOR CHEST PAIN 25 tablet 4  . pantoprazole (PROTONIX) 40 MG tablet TAKE 1 TABLET(40 MG) BY MOUTH DAILY 90 tablet 1  . sevelamer carbonate (RENVELA) 800 MG tablet Take 800 mg by mouth 3 (three) times daily with meals.    . simethicone (MYLICON) 80 MG chewable tablet Chew 1 tablet (80 mg total) by mouth 4 (four) times daily as needed for flatulence. 90 tablet 3  . timolol (TIMOPTIC) 0.5 % ophthalmic solution Place 1 drop into both eyes every morning.     No current facility-administered medications for this visit.    Allergies:   Augmentin [amoxicillin-pot clavulanate] and Amphetamines    Social History:  The patient  reports that he has been smoking cigarettes. He has a 21.50 pack-year smoking history. He has never used smokeless tobacco. He reports previous alcohol use of about 12.0 standard drinks of alcohol per week. He reports that he does not use drugs.   Family History:  The patient's family history includes Alzheimer's disease in his mother and sister; Diabetes in his brother, brother, and sister; Drug abuse in his brother; Heart attack in his brother; Heart failure in his father; Hypertension in his brother and father; Stroke in his sister.    ROS:  General:no colds or fevers,  weight down 3 lbs by our scales but at home back to baseline Skin:no rashes or ulcers HEENT:no blurred vision, no congestion CV:see HPI PUL:see HPI GI:no diarrhea constipation or melena, no indigestion GU:no hematuria, no dysuria MS:no joint pain, no claudication Neuro:no syncope,  no lightheadedness Endo:+ diabetes glucose stable, no thyroid disease  Wt Readings from Last 3 Encounters:  12/12/19  190 lb (86.2 kg)  11/19/19 190 lb (86.2 kg)  11/07/19 189 lb (85.7 kg)     PHYSICAL EXAM: VS:  Ht 6\' 2"  (1.88 m)   Wt 190 lb (86.2 kg)   BMI 24.39 kg/m  , BMI Body mass index is 24.39 kg/m.   Telephone no exam    EKG:   10/21/17 SR rate 77 PVC possible old IMI lateral T wave inversion    Recent Labs: 07/21/2019: B Natriuretic Peptide 1,535.2; Magnesium 1.8 10/21/2019: ALT 214 11/09/2019: BUN 61; Creatinine 6.3; Hemoglobin 7.9; Platelets 150; Potassium 4.6; Sodium 137    Lipid Panel    Component Value Date/Time   CHOL 158 06/04/2019 1019   CHOL 121 08/07/2015 1041   TRIG 574 (H) 06/04/2019 1019   HDL 19 (L) 06/04/2019 1019   HDL 32 (L) 08/07/2015 1041   CHOLHDL 8.3 (H) 06/04/2019 1019   VLDL 48 (H) 03/15/2017 0924   LDLCALC  06/04/2019 1019     Comment:     . LDL cholesterol not calculated. Triglyceride levels greater than 400 mg/dL invalidate calculated LDL results. . Reference range: <100 . Desirable range <100 mg/dL for primary prevention;   <70 mg/dL for patients with CHD or diabetic patients  with > or = 2 CHD risk factors. [name] LDL-C is now calculated using the Martin-Hopkins  calculation, which is a validated novel method providing  better accuracy than the Friedewald equation in the  estimation of LDL-C.  Cresenciano Genre et al. Annamaria Helling. 0037;048(88): 2061-2068  (http://education.[employer].com/faq/FAQ164)        Other studies Reviewed: Additional studies/ records that were reviewed today include: recent OV note.  ECHO 11/10/2016 ------------------------------------------------------------------- Study Conclusions  - Left ventricle: Septal and inferior wall hypokinesis The cavity   size was mildly dilated. There was moderate concentric   hypertrophy. Systolic function was mildly reduced. The estimated   ejection fraction was in the  range of 45% to 50%. Doppler   parameters are consistent with both elevated ventricular   end-diastolic filling pressure and elevated left atrial filling   pressure. - Left atrium: The atrium was mildly dilated. - Atrial septum: No defect or patent foramen ovale was identified.  ASSESSMENT AND PLAN:  1.  Acute on chronic systolic and diastolic HF this is related to CRF  EF 40-45% echo April 2020  Worsening dyspnea and edema ? CHF will arrange right and left heart cath next week on Tuesday or Thursday after a dialysis day. Lab work and COVID testing 24-48 hours before  Will then have him f/u with CHF clinic post cath   2. CAD no angina, Distant CABG 2004.  Myovue 12/13/18 high risk due to EF 26% large inferior wall infarct See above regarding cath   3. DM:  Discussed low carb diet.  Target hemoglobin A1c is 6.5 or less.  Continue current medications.  4. CRF:  On dialysis since October he indicates weight is down despite worse dyspnea and edema F/U renal ? Add oral lasix since not anuric   5. Cholesterol:  On statin labs with primary Triglycerides  up due to poor BS control   6. HTN:  Well controlled.  Continue current medications and low sodium Dash type diet.    Pre cath labs Right and left cath next week F/U CHF clinic post cath F/U with me in 6 months   Time spent reviewing chart LE duplex exams, direct patient interview and arranging cath with CHF clinic f/u 30 minutes   Jenkins Rouge, MD

## 2019-12-12 NOTE — Patient Instructions (Signed)
Medication Instructions:  *If you need a refill on your cardiac medications before your next appointment, please call your pharmacy*  Lab Work: Your physician recommends that you return for lab work in: BMET and CBC  If you have labs (blood work) drawn today and your tests are completely normal, you will receive your results only by: [name] MyChart Message (if you have MyChart) OR . A paper copy in the mail If you have any lab test that is abnormal or we need to change your treatment, we will call you to review the results.  Testing/Procedures: Your physician has requested that you have a cardiac catheterization. Cardiac catheterization is used to diagnose and/or treat various heart conditions. Doctors may recommend this procedure for a number of different reasons. The most common reason is to evaluate chest pain. Chest pain can be a symptom of coronary artery disease (CAD), and cardiac catheterization can show whether plaque is narrowing or blocking your heart's arteries. This procedure is also used to evaluate the valves, as well as measure the blood flow and oxygen levels in different parts of your heart. For further information please visit HugeFiesta.tn. Please follow instruction sheet, as given.  Follow-Up: At [facility], you and your health needs are our priority.  As part of our continuing mission to provide you with exceptional heart care, we have created designated Provider Care Teams.  These Care Teams include your primary Cardiologist (physician) and Advanced Practice Providers (APPs -  Physician Assistants and Nurse Practitioners) who all work together to provide you with the care you need, when you need it.  Your next appointment:   To be scheduled with [facility] Failure [facility] after your cardiac catheterization. You will follow up with Dr. [name] in 6 months.     [facility] OFFICE [place],  [place] [address] Dept: [phone] Loc: [phone]  Jacob [name]  12/12/2019  You are scheduled for a Cardiac Catheterization on Thursday, February 25 with Dr. Larae Grooms.  1. Please arrive at the [facility] (Main Entrance A) at [facility]: [address] [place], [place] [address] at 5:30 AM (This time is two hours before your procedure to ensure your preparation). Free valet parking service is available.   Special note: Every effort is made to have your procedure done on time. Please understand that emergencies sometimes delay scheduled procedures.  2. Diet: Do not eat solid foods after midnight.   3. Labs: You will need to have blood drawn on  Tuesday, February 23 at [facility] [address]  Open: 7:30am - 5pm    Phone: [phone]. You do not need to be fasting.  Your Pre-procedure COVID-19 Testing will be done on 12/17/19 at [facility] at [address], [place], [place] [address]. Once you arrive at the testing site, stay in the right hand lane, go under the building overhang not the tent. If you are tested under the tent your results may not be back before your procedure. Please be on time for your appointment.  After your swab you will be given a mask to wear and instructed to go home and quarantine/no visitors until after your procedure. If you test positive you will be notified and your procedure will be cancelled.   4. Medication instructions in preparation for your procedure:   Contrast Allergy: No   If needed-  Take only 10 units of insulin the night before your procedure. Do not take any insulin on the day of the procedure.   On the morning of your procedure, take your Aspirin and any morning medicines NOT listed above.  You may use sips of water.  5. Plan for one night stay--bring personal belongings. 6. Bring a current list of your medications and current  insurance cards. 7. You MUST have a responsible person to drive you home. 8. Someone MUST be with you the first 24 hours after you arrive home or your discharge will be delayed. 9. Please wear clothes that are easy to get on and off and wear slip-on shoes.  Thank you for allowing [place] to care for you!   -- [facility]

## 2019-12-16 ENCOUNTER — Telehealth: Payer: Self-pay | Admitting: *Deleted

## 2019-12-16 NOTE — Telephone Encounter (Signed)
Pt contacted pre-catheterization scheduled at [facility] for: Thursday December 19, 2019 7:30 AM Verified arrival time and place: [facility]) at: 5:30 AM   No solid food after midnight prior to cath, clear liquids until 5 AM day of procedure. Contrast allergy: no  Hold: Insulin-AM of procedure. 1/2 HS Insulin dose PM prior to procedure  Except hold medications AM meds can be  taken pre-cath with sip of water including: ASA 81 mg   Confirmed patient has responsible adult to drive home post procedure and observe 24 hours after arriving home: yes  Currently, due to Covid-19 pandemic, only one person will be allowed with patient. Must be the same person for patient's entire stay and will be required to wear a mask. They will be asked to wait in the waiting room for the duration of the patient's stay.  Patients are required to wear a mask when they enter the hospital.      COVID-19 Pre-Screening Questions:  . In the past 7 to 10 days have you had a cough,  shortness of breath, headache, congestion, fever (100 or greater) body aches, chills, sore throat, or sudden loss of taste or sense of smell? no . Have you been around anyone with known Covid 19 in the past 7-10 days? no . Have you been around anyone who is awaiting Covid 19 test results in the past 7 to 10 days? no . Have you been around anyone who has been exposed to Covid 19, or has mentioned symptoms of Covid 19 within the past 7 to 10 days? No   Pt states he received second COVID-19 vaccine 12/07/19.

## 2019-12-16 NOTE — Telephone Encounter (Deleted)
   Pt contacted pre-catheterization scheduled at [facility] for: Thursday December 19, 2019 7:30 AM Verified arrival time and place: [facility]) at: 5:30 AM   No solid food after midnight prior to cath, clear liquids until 5 AM day of procedure. Contrast allergy: no  Hold: Insulin-AM of procedure Insulin-1/2 usual dose PM prior to procedure  Except hold medications AM meds can be  taken pre-cath with sip of water including: ASA 81 mg   Confirmed patient has responsible adult to drive home post procedure and observe 24 hours after arriving home:   Currently, due to Covid-19 pandemic, only one person will be allowed with patient. Must be the same person for patient's entire stay and will be required to wear a mask. They will be asked to wait in the waiting room for the duration of the patient's stay.  Patients are required to wear a mask when they enter the hospital.

## 2019-12-17 ENCOUNTER — Other Ambulatory Visit: Payer: Self-pay

## 2019-12-17 ENCOUNTER — Ambulatory Visit (INDEPENDENT_AMBULATORY_CARE_PROVIDER_SITE_OTHER): Payer: Medicare Other | Admitting: Family

## 2019-12-17 ENCOUNTER — Other Ambulatory Visit: Payer: Medicare Other

## 2019-12-17 ENCOUNTER — Other Ambulatory Visit (HOSPITAL_COMMUNITY)
Admission: RE | Admit: 2019-12-17 | Discharge: 2019-12-17 | Disposition: A | Discharge status: Home / Self Care | Payer: Medicare Other | Source: Ambulatory Visit | Attending: Interventional Cardiology | Admitting: Interventional Cardiology

## 2019-12-17 ENCOUNTER — Encounter: Payer: Self-pay | Admitting: Family

## 2019-12-17 DIAGNOSIS — Z01812 Encounter for preprocedural laboratory examination: Secondary | ICD-10-CM | POA: Diagnosis present

## 2019-12-17 DIAGNOSIS — Z20822 Contact with and (suspected) exposure to covid-19: Secondary | ICD-10-CM | POA: Insufficient documentation

## 2019-12-17 DIAGNOSIS — I255 Ischemic cardiomyopathy: Secondary | ICD-10-CM

## 2019-12-17 DIAGNOSIS — Z794 Long term (current) use of insulin: Secondary | ICD-10-CM | POA: Diagnosis not present

## 2019-12-17 DIAGNOSIS — E1140 Type 2 diabetes mellitus with diabetic neuropathy, unspecified: Secondary | ICD-10-CM | POA: Diagnosis not present

## 2019-12-17 DIAGNOSIS — I5022 Chronic systolic (congestive) heart failure: Secondary | ICD-10-CM

## 2019-12-17 LAB — CBC WITH DIFFERENTIAL/PLATELET
Basophils Absolute: 0 10*3/uL (ref 0.0–0.2)
Basos: 1 %
EOS (ABSOLUTE): 0.1 10*3/uL (ref 0.0–0.4)
Eos: 3 %
Hematocrit: 26 % — ABNORMAL LOW (ref 37.5–51.0)
Hemoglobin: 8.4 g/dL — ABNORMAL LOW (ref 13.0–17.7)
Lymphocytes Absolute: 0.9 10*3/uL (ref 0.7–3.1)
Lymphs: 20 %
MCH: 26.3 pg — ABNORMAL LOW (ref 26.6–33.0)
MCHC: 32.3 g/dL (ref 31.5–35.7)
MCV: 81 fL (ref 79–97)
Monocytes Absolute: 0.7 10*3/uL (ref 0.1–0.9)
Monocytes: 15 %
Neutrophils Absolute: 2.8 10*3/uL (ref 1.4–7.0)
Neutrophils: 61 %
Platelets: 145 10*3/uL — ABNORMAL LOW (ref 150–450)
RBC: 3.2 x10E6/uL — ABNORMAL LOW (ref 4.14–5.80)
RDW: 18.1 % — ABNORMAL HIGH (ref 11.6–15.4)
WBC: 4.5 10*3/uL (ref 3.4–10.8)

## 2019-12-17 LAB — BASIC METABOLIC PANEL
BUN/Creatinine Ratio: 8 — ABNORMAL LOW (ref 10–24)
BUN: 41 mg/dL — ABNORMAL HIGH (ref 8–27)
CO2: 30 mmol/L — ABNORMAL HIGH (ref 20–29)
Calcium: 8.8 mg/dL (ref 8.6–10.2)
Chloride: 97 mmol/L (ref 96–106)
Creatinine, Ser: 4.95 mg/dL — ABNORMAL HIGH (ref 0.76–1.27)
GFR calc Af Amer: 13 mL/min/{1.73_m2} — ABNORMAL LOW (ref >59)
GFR calc non Af Amer: 11 mL/min/{1.73_m2} — ABNORMAL LOW (ref >59)
Glucose: 57 mg/dL — ABNORMAL LOW (ref 65–99)
Potassium: 3 mmol/L — ABNORMAL LOW (ref 3.5–5.2)
Sodium: 139 mmol/L (ref 134–144)

## 2019-12-17 LAB — SARS CORONAVIRUS 2 (TAT 6-24 HRS): SARS Coronavirus 2: NEGATIVE

## 2019-12-17 NOTE — Progress Notes (Signed)
[name], male   DOB: Dec 23, 1950, 69 y.o.   MRN: [id number] This service is provided via telemedicine  No vital signs collected/recorded due to the encounter was a telemedicine visit.   Location of patient (ex: home, work):  HOME  Patient consents to a telephone visit:  YES  Location of the provider (ex: office, home):  OFFICE  Name of any referring provider:  Sherrie Mustache, NP  Names of all persons participating in the telemedicine service and their role in the encounter:  PATIENT, Jacob [name], [place], Marlowe Sax, NP  Time spent on call:  5:13   Provider: Kenlee Vogt FNP-C  [name], NP  Patient Care Team: [name], NP as PCP - General (Geriatric Medicine) [name], MD as PCP - Infectious Diseases (Infectious Diseases) [name], MD as Consulting Physician (Ophthalmology) [name], MD as Consulting Physician (Cardiology) [name], MD as Consulting Physician (Infectious Diseases) Gardiner Barefoot, DPM as Consulting Physician (Podiatry) [place] Hagger, Nelda Bucks, NP as Nurse Practitioner (Family Medicine) [name]  Extended Emergency Contact Information Primary Emergency Contact: Bertrum Sol Address: [address]          [place], [place] [address] [place] Phone: [phone] Mobile Phone: [phone] Relation: Friend Secondary Emergency Contact: Alda Berthold [place] Phone: [phone] Relation: Sister  Code Status: Full Code  Goals of care: Advanced Directive information Advanced Directives 10/29/2019  Does Patient Have a Medical Advance Directive? Yes  Type of Advance Directive Living will  Does patient want to make changes to medical advance directive? No - Patient declined  Copy of [place] in Chart? -  Would patient like information on creating a medical advance directive? -     Chief Complaint  Patient presents with  .  Acute Visit    complains of high blood sugar    HPI:  Pt is a 69 y.o. male seen today for an acute visit for evaluation of high blood sugars.He states just checked his blood sugars was 193.states CBG has been running from 120's-180's but on three days ago CBG was in the 215 He states just had two sugar free popsicles. He denies any signs of hypo/hyperglycemia.currently on Humalog 20 units SQ when CBG > 150 latest Hgb A1C reviewed was 5.0 had BMP done by cardi logist office today Glucose was 57. He is scheduled for right and left Hear Cath  To be done on 2/25/2021ordered by Cardiologist.    Past Medical History:  Diagnosis Date  . Acute respiratory failure ([place]) 03/01/2018  . Arthritis    "all over; mostly knees and back" (02/28/2018)  . CHF (congestive heart failure) ([facility])    not on any meds  . Chronic lower back pain    stenosis  . Community acquired pneumonia 09/06/2013  . COPD (chronic obstructive pulmonary disease) ([facility])   . Coronary atherosclerosis of native coronary artery 2005   s/p surgery  . Drug abuse ([facility])    hx; tested for cocaine as recently as 2/08. says he is not using drugs now - avoided defib. for this reason   . ESRD (end stage renal disease) ([facility])    Hemo M-W-F- Richarda Blade  . GERD (gastroesophageal reflux disease)    takes OTC meds as needed  . GI bleeding 02/06/2019  . Glaucoma    uses eye drops daily  . Hepatitis B 1968   "tx'd w/isolation; caught it from toilet stools in gym"  .  History of blood transfusion 03/01/2019  . History of colon polyps    benign  . History of gout    takes Allopurinol daily as well as Colchicine-if needed (02/28/2018)  . History of kidney stones   . HTN (hypertension)    takes Coreg,Imdur.and Apresoline daily  . Human immunodeficiency virus (HIV) disease ([facility]) dx'd 1995   takes Genvoya daily  . Hyperlipidemia    takes Atorvastatin daily  . Ischemic cardiomyopathy   . Muscle spasm    takes Zanaflex as needed  . Myocardial  infarction ([facility]) ~ 2004/2005  . Nocturia   . Peripheral neuropathy    takes gabapentin daily  . Pneumonia    "at least twice" (02/28/2018)  . Shortness of breath dyspnea    rarely but if notices it then with exertion  . Syphilis, unspecified   . Type II diabetes mellitus ([facility]) 2004   Lantus daily.Average fasting blood sugar 125-199  . Wears glasses   . Wears partial dentures    Past Surgical History:  Procedure Laterality Date  . AV FISTULA PLACEMENT Left 08/02/2018   Procedure: ARTERIOVENOUS (AV) FISTULA CREATION  left arm radiocephlic;  Surgeon: Marty Heck, MD;  Location: [facility];  Service: Vascular;  Laterality: Left;  . AV FISTULA PLACEMENT Left 08/01/2019   Procedure: LEFT BRACHIOCEPHALIC ARTERIOVENOUS (AV) FISTULA CREATION;  Surgeon: [name], MD;  Location: [facility];  Service: Vascular;  Laterality: Left;  . BASCILIC VEIN TRANSPOSITION Left 10/03/2019   Procedure: BASILIC VEIN TRANSPOSITION LEFT SECOND STAGE;  Surgeon: [name], MD;  Location: [facility];  Service: Vascular;  Laterality: Left;  . CARDIAC CATHETERIZATION  10/2002; 12/19/2004   [name] 03/08/2011  . COLONOSCOPY  2013   Dr.John Henrene Pastor   . CORONARY ARTERY BYPASS GRAFT  02/24/2003   CABG X2/notes 03/08/2011  . ESOPHAGOGASTRODUODENOSCOPY (EGD) WITH PROPOFOL N/A 02/08/2019   Procedure: ESOPHAGOGASTRODUODENOSCOPY (EGD) WITH PROPOFOL;  Surgeon: Milus Banister, MD;  Location: [facility];  Service: Gastroenterology;  Laterality: N/A;  . HOT HEMOSTASIS N/A 02/08/2019   Procedure: HOT HEMOSTASIS (ARGON PLASMA COAGULATION/BICAP);  Surgeon: Milus Banister, MD;  Location[facility] ENDOSCOPY;  Service: Gastroenterology;  Laterality: N/A;  . INTERTROCHANTERIC HIP FRACTURE SURGERY Left 11/2006   [name] 03/08/2011  . IR FLUORO GUIDE CV LINE RIGHT  07/24/2019  . IR FLUORO GUIDE CV LINE RIGHT  07/30/2019  . IR US GUIDE VASC ACCESS RIGHT  07/24/2019  . IR US GUIDE VASC ACCESS RIGHT  07/30/2019  . LAPAROSCOPIC CHOLECYSTECTOMY   05/2006  . LIGATION OF COMPETING BRANCHES OF ARTERIOVENOUS FISTULA Left 11/05/2018   Procedure: LIGATION OF COMPETING BRANCHES OF ARTERIOVENOUS FISTULA  LEFT  ARM;  Surgeon: Marty Heck, MD;  Location: [facility];  Service: Vascular;  Laterality: Left;  . LUMBAR LAMINECTOMY/DECOMPRESSION MICRODISCECTOMY N/A 02/29/2016   Procedure: Left L4-5 Lateral Recess Decompression, Removal Extradural Intraspinal Facet Cyst;  Surgeon: Marybelle Killings, MD;  Location: [facility];  Service: Orthopedics;  Laterality: N/A;  . MULTIPLE TOOTH EXTRACTIONS    . ORIF MANDIBULAR FRACTURE Left 08/13/2004   ORIF of left body fracture mandible with KLS Martin 2.3-mm six hole/notes 03/08/2011    Allergies  Allergen Reactions  . Augmentin [Amoxicillin-Pot Clavulanate] Diarrhea    Severe  . Amphetamines Other (See Comments)    Unknown reaction type    Outpatient Encounter Medications as of 12/17/2019  Medication Sig  . acetaminophen (TYLENOL) 500 MG tablet Take 1,000 mg by mouth every 6 (six) hours as needed for moderate pain.   [name]  allopurinol (ZYLOPRIM) 100 MG tablet TAKE 1 TABLET(100 MG) BY MOUTH DAILY  . ANORO ELLIPTA 62.5-25 MCG/INH AEPB INHALE 1 PUFF BY MOUTH EVERY DAY  . aspirin EC 81 MG tablet Take 81 mg by mouth daily.  [name] atorvastatin (LIPITOR) 80 MG tablet Take 1 tablet (80 mg total) by mouth daily.  . B-D UF III MINI PEN NEEDLES 31G X 5 MM MISC USE FOUR TIMES DAILY  . BIKTARVY 50-200-25 MG TABS tablet TAKE 1 TABLET BY MOUTH DAILY  . Choline Fenofibrate (FENOFIBRIC ACID) 135 MG CPDR TAKE 1 CAPSULE BY MOUTH DAILY  . colchicine 0.6 MG tablet Take 0.6 mg by mouth daily as needed (pain).  [name] diclofenac Sodium (VOLTAREN) 1 % GEL Apply 2 g topically 3 (three) times daily as needed (pain).  [name] docusate sodium (COLACE) 100 MG capsule Take 100 mg by mouth daily as needed for mild constipation or moderate constipation.  . gabapentin (NEURONTIN) 300 MG capsule Take 1 capsule (300 mg total) by mouth daily.  [name] glucose blood test  strip Use to test blood sugar three times daily. E11.40  . insulin lispro (HUMALOG) 100 UNIT/ML injection Inject 20 Units into the skin 3 (three) times daily as needed for high blood sugar (above 150).   . isosorbide mononitrate (IMDUR) 30 MG 24 hr tablet TAKE 1 TABLET(30 MG) BY MOUTH DAILY  . Lancets (ONETOUCH DELICA PLUS LNLGXQ11H) MISC Inject 1 Device as directed 3 (three) times daily. Dx: E11.40  . latanoprost (XALATAN) 0.005 % ophthalmic solution Place 1 drop into both eyes at bedtime.  . Melatonin 3 MG TABS Take 1 tablet (3 mg total) by mouth at bedtime.  . multivitamin (RENA-VIT) TABS tablet Take 1 tablet by mouth daily.  . nitroGLYCERIN (NITROSTAT) 0.3 MG SL tablet ONE TABLET UNDER TONGUE AS NEEDED FOR CHEST PAIN  . pantoprazole (PROTONIX) 40 MG tablet TAKE 1 TABLET(40 MG) BY MOUTH DAILY  . polyethylene glycol (MIRALAX / GLYCOLAX) 17 g packet Take 17 g by mouth daily as needed for moderate constipation.  . sevelamer carbonate (RENVELA) 800 MG tablet Take 800 mg by mouth 3 (three) times daily with meals.  . simethicone (MYLICON) 80 MG chewable tablet Chew 1 tablet (80 mg total) by mouth 4 (four) times daily as needed for flatulence.  . timolol (TIMOPTIC) 0.5 % ophthalmic solution Place 1 drop into both eyes every morning.  . [DISCONTINUED] Homeopathic Products ([place]) Apply 1 application topically 2 (two) times daily as needed (foot pain).   Facility-Administered Encounter Medications as of 12/17/2019  Medication  . sodium chloride flush (NS) 0.9 % injection 3 mL    Review of Systems  Constitutional: Negative for appetite change, chills, fatigue and fever.  HENT: Negative for congestion, rhinorrhea, sinus pressure, sinus pain, sneezing, sore throat and trouble swallowing.   Respiratory: Negative for cough, chest tightness, shortness of breath and wheezing.   Cardiovascular: Negative for chest pain, palpitations and leg swelling.  Gastrointestinal: Negative for abdominal  distention, abdominal pain, constipation, diarrhea, nausea and vomiting.  Genitourinary:       On hemodialysis on Mon,wed and Fri   Skin: Negative for color change, pallor and rash.  Neurological: Negative for dizziness, light-headedness and headaches.       Chronic numbness and tingling on feet   Psychiatric/Behavioral: Negative for agitation and confusion. The patient is not nervous/anxious.     Immunization History  Administered Date(s) Administered  . Fluad Quad(high Dose 65+) 06/14/2019  . Hepatitis B 08/28/2006, 10/02/2007, 04/01/2008  .  Hepatitis B, adult 06/03/2014, 07/04/2014  . Influenza Split 07/28/2011  . Influenza Whole 08/28/2006, 09/10/2007, 09/15/2008, 08/03/2009, 07/26/2010  . Influenza, High Dose Seasonal PF 07/04/2018  . Influenza,inj,Quad PF,6+ Mos 07/04/2014, 07/06/2015, 07/12/2016, 07/11/2017  . Influenza-Unspecified 06/24/2013  . PFIZER SARS-COV-2 Vaccination 11/16/2019  . Pneumococcal Conjugate-13 08/18/2016  . Pneumococcal Polysaccharide-23 08/28/2006, 07/28/2011, 05/30/2018  . Tdap 06/14/2019  . Zoster 06/03/2014  . Zoster Recombinat (Shingrix) 07/24/2017, 01/09/2018   Pertinent  Health Maintenance Due  Topic Date Due  . FOOT EXAM  12/06/2019  . HEMOGLOBIN A1C  02/07/2020  . LIPID PANEL  06/03/2020  . OPHTHALMOLOGY EXAM  10/07/2020  . COLONOSCOPY  10/01/2022  . INFLUENZA VACCINE  Completed  . PNA vac Low Risk Adult  Completed   Fall Risk  11/19/2019 10/29/2019 08/15/2019 08/06/2019 07/08/2019  Falls in the past year? 0 0 0 0 0  Comment - - - - -  Number falls in past yr: 0 0 0 0 0  Comment - - - - -  Injury with Fall? 0 0 0 0 0  Comment - - - - -  Risk Factor Category  - - - - -  Risk for fall due to : - - - - -  Risk for fall due to: Comment - - - - -  Follow up - - - - -   There were no vitals filed for this visit. There is no height or weight on file to calculate BMI. Physical Exam  Unable to complete on telephone visit.   Labs  reviewed: Recent Labs    07/21/19 0928 07/21/19 0943 07/30/19 1219 07/30/19 1219 07/31/19 0318 07/31/19 0318 08/01/19 0325 08/01/19 0325 10/03/19 0926 10/03/19 0926 10/21/19 1035 11/09/19 0000 12/17/19 1042  NA 140   < > 132*   < > 131*   < > 132*   < > 136   < > 133* 137 139  K 4.5   < > 3.9   < > 4.1   < > 3.8   < > 3.3*   < > 3.5 4.6 3.0*  CL 108   < > 96*   < > 96*   < > 95*   < > 94*   < > 93* 98* 97  CO2 23   < > 19*   < > 18*   < > 20*  --   --   --  26  --  30*  GLUCOSE 110*   < > 155*   < > 202*   < > 205*   < > 120*  --  210*  --  57*  BUN 72*   < > 99*   < > 117*   < > 65*   < > 28*   < > 55* 61* 41*  CREATININE 5.55*   < > 7.39*   < > 7.77*   < > 5.73*   < > 4.20*   < > 7.06* 6.3* 4.95*  CALCIUM 9.0   < > 8.4*   < > 8.4*   < > 8.5*   < >  --   --  8.9 9.1 8.8  MG 1.8  --   --   --   --   --   --   --   --   --   --   --   --   PHOS 5.1*   < > 7.5*  --  8.2*  --  6.5*  --   --   --   --   --   --    < > =  values in this interval not displayed.   Recent Labs    07/21/19 0928 07/21/19 1821 07/23/19 0608 07/24/19 0241 08/01/19 0325 10/21/19 1035 11/09/19 0000  AST 95*  --  47*  --   --  177*  --   ALT 51*  --  34  --   --  214*  --   ALKPHOS 42  --  43  --   --  86 74  BILITOT 0.7  --  1.2  --   --  1.0  --   PROT 7.7  --  7.6  --   --  7.4  --   ALBUMIN 3.2*   < > 3.0*   < > 2.2* 3.3* 5.0   < > = values in this interval not displayed.   Recent Labs    07/21/19 0928 07/21/19 0943 07/29/19 0357 07/30/19 1219 10/21/19 1035 10/21/19 1035 11/07/19 1441 11/09/19 0000 12/17/19 1042  WBC 7.2   < > 6.7   < > 5.4   < > 3.8* 3.3 4.5  NEUTROABS 5.6  --  4.4  --   --   --   --   --  2.8  HGB 9.7*   < > 8.2*   < > 8.1*   < > 8.2 Repeated and verified X2.* 7.9* 8.4*  HCT 28.9*   < > 24.4*   < > 26.5*   < > 26.1* 27* 26.0*  MCV 100.3*   < > 96.1   < > 101.1*  --  93.6  --  81  PLT 200   < > 205   < > 230   < > 131.0* 150 145*   < > = values in this interval not  displayed.   Lab Results  Component Value Date   TSH 0.81 07/18/2017   Lab Results  Component Value Date   HGBA1C 5.0 11/09/2019   Lab Results  Component Value Date   CHOL 158 06/04/2019   HDL 19 (L) 06/04/2019   [place]  06/04/2019     Comment:     . LDL cholesterol not calculated. Triglyceride levels greater than 400 mg/dL invalidate calculated LDL results. . Reference range: <100 . Desirable range <100 mg/dL for primary prevention;   <70 mg/dL for patients with CHD or diabetic patients  with > or = 2 CHD risk factors. [name] LDL-C is now calculated using the Martin-Hopkins  calculation, which is a validated novel method providing  better accuracy than the Friedewald equation in the  estimation of LDL-C.  Cresenciano Genre et al. Annamaria Helling. 7169;678(93): 2061-2068  (http://education.[facility].com/faq/FAQ164)    TRIG 574 (H) 06/04/2019   CHOLHDL 8.3 (H) 06/04/2019    Significant Diagnostic Results in last 30 days:  LE ART SEG MULTI (Segm & LE Reynauds)  Result Date: 12/05/2019 LOWER EXTREMITY DOPPLER STUDY High Risk Factors: Hypertension, hyperlipidemia, Diabetes, current smoker,                    coronary artery disease. Other Factors: Has been having left leg, calf and foot, edema for over 2 weeks.                He denies claudication and no rest pain.  Performing Technologist: Wilkie Aye RVT  Examination Guidelines: A complete evaluation includes at minimum, Doppler waveform signals and systolic blood pressure reading at the level of bilateral brachial, anterior tibial, and posterior tibial arteries, when vessel segments are accessible. Bilateral testing is considered an integral  part of a complete examination. Photoelectric Plethysmograph (PPG) waveforms and toe systolic pressure readings are included as required and additional duplex testing as needed. Limited examinations for reoccurring indications may be performed as noted.  ABI Findings:  +---------+------------------+-----+---------+--------+ Right    Rt Pressure (mmHg)IndexWaveform Comment  +---------+------------------+-----+---------+--------+ Brachial 142                                      +---------+------------------+-----+---------+--------+ CFA                             triphasic         +---------+------------------+-----+---------+--------+ Popliteal                       triphasic         +---------+------------------+-----+---------+--------+ ATA      170               1.20 triphasic         +---------+------------------+-----+---------+--------+ PTA      164               1.15 triphasic         +---------+------------------+-----+---------+--------+ PERO     159               1.12 triphasic         +---------+------------------+-----+---------+--------+ Cleotis Nipper               0.89 Normal            +---------+------------------+-----+---------+--------+ +---------+------------------+-----+---------+------------+ Left     Lt Pressure (mmHg)IndexWaveform Comment      +---------+------------------+-----+---------+------------+ Brachial                                 AVF dialysis +---------+------------------+-----+---------+------------+ CFA                             triphasic             +---------+------------------+-----+---------+------------+ Popliteal                       triphasic             +---------+------------------+-----+---------+------------+ ATA      166               1.17 triphasic             +---------+------------------+-----+---------+------------+ PTA      168               1.18 triphasic             +---------+------------------+-----+---------+------------+ PERO     172               1.21 triphasic             +---------+------------------+-----+---------+------------+ Great Toe124               0.87 Normal                 +---------+------------------+-----+---------+------------+ +-------+-----------+-----------+ ABI/TBIToday's ABIToday's TBI +-------+-----------+-----------+ Right  1.15       0.89        +-------+-----------+-----------+ Left   1.21       0.87        +-------+-----------+-----------+  Irregular heartbeat noted throughout exam.  Summary: Right: Resting right ankle-brachial index is within normal range. No evidence of significant right lower extremity arterial disease. The right toe-brachial index is normal. Left: Resting left ankle-brachial index is within normal range. No evidence of significant left lower extremity arterial disease. The left toe-brachial index is normal.  *See table(s) above for measurements and observations.  Electronically signed by Jenkins Rouge MD on 12/05/2019 at 8:24:22 PM.    Final    VAS [place] LOWER EXTREMITY VENOUS (DVT)  Result Date: 12/03/2019  Lower Venous DVTStudy Indications: Edema. Other Indications: Patient complains of bilateral leg swelling with the left                    worse than the right. He denies any shortness of breath. Risk Factors: None identified. Performing Technologist: Wilkie Aye RVT  Examination Guidelines: A complete evaluation includes B-mode imaging, spectral Doppler, color Doppler, and power Doppler as needed of all accessible portions of each vessel. Bilateral testing is considered an integral part of a complete examination. Limited examinations for reoccurring indications may be performed as noted. The reflux portion of the exam is performed with the patient in reverse Trendelenburg.  +---------+---------------+---------+-----------+----------+--------------+ RIGHT    CompressibilityPhasicitySpontaneityPropertiesThrombus Aging +---------+---------------+---------+-----------+----------+--------------+ CFV      Full           Yes      Yes                                  +---------+---------------+---------+-----------+----------+--------------+ SFJ      Full           Yes      Yes                                 +---------+---------------+---------+-----------+----------+--------------+ FV Prox  Full           Yes      Yes                                 +---------+---------------+---------+-----------+----------+--------------+ FV Mid   Full           Yes      Yes                                 +---------+---------------+---------+-----------+----------+--------------+ FV DistalFull           Yes      Yes                                 +---------+---------------+---------+-----------+----------+--------------+ PFV      Full                                                        +---------+---------------+---------+-----------+----------+--------------+ POP      Full           Yes      Yes                                 +---------+---------------+---------+-----------+----------+--------------+  PTV      Full           Yes      Yes                                 +---------+---------------+---------+-----------+----------+--------------+ PERO     Full           Yes      Yes                                 +---------+---------------+---------+-----------+----------+--------------+ Gastroc  Full                                                        +---------+---------------+---------+-----------+----------+--------------+ GSV      Full           Yes      Yes                                 +---------+---------------+---------+-----------+----------+--------------+   +---------+---------------+---------+-----------+----------+--------------+ LEFT     CompressibilityPhasicitySpontaneityPropertiesThrombus Aging +---------+---------------+---------+-----------+----------+--------------+ CFV      Full           Yes      Yes                                  +---------+---------------+---------+-----------+----------+--------------+ SFJ      Full           Yes      Yes                                 +---------+---------------+---------+-----------+----------+--------------+ FV Prox  Full           Yes      Yes                                 +---------+---------------+---------+-----------+----------+--------------+ FV Mid   Full           Yes      Yes                                 +---------+---------------+---------+-----------+----------+--------------+ FV DistalFull           Yes      Yes                                 +---------+---------------+---------+-----------+----------+--------------+ PFV      Full                                                        +---------+---------------+---------+-----------+----------+--------------+ POP      Full           Yes  Yes                                 +---------+---------------+---------+-----------+----------+--------------+ PTV      Full           Yes      Yes                                 +---------+---------------+---------+-----------+----------+--------------+ PERO     Full           Yes      Yes                                 +---------+---------------+---------+-----------+----------+--------------+ Gastroc  Full                                                        +---------+---------------+---------+-----------+----------+--------------+ GSV      Full           Yes      Yes                                 +---------+---------------+---------+-----------+----------+--------------+  Summary: BILATERAL: - No evidence of deep vein thrombosis seen in the lower extremities, bilaterally. - No evidence of superficial venous thrombosis in the lower extremities, bilaterally.  RIGHT: - No cystic structure found in the popliteal fossa.  LEFT: - No cystic structure found in the popliteal fossa.  *See table(s) above for measurements and  observations. Electronically signed by Jenkins Rouge MD on 12/03/2019 at 2:01:34 PM.    Final     Assessment/Plan  Type 2 diabetes mellitus with diabetic neuropathy, with long-term current use of insulin ([place]) Lab Results  Component Value Date   HGBA1C 5.0 11/09/2019  CBG in the 120's-180's.will continue on Humalog 20 units SQ for CBG > 150  - Notify provider if CBG > 200.  - continue on Gabapentin 300 mg capsule daily.on Statin and ASA for cardiovascular event prevention.   Family/ staff Communication: Reviewed plan of care with patient  Labs/tests ordered: None   Next Appointment: Has upcoming appointment 01/28/2020 for 3 months follow up.  Spent 12 minutes of non-face to face with patient    [name], NP

## 2019-12-17 NOTE — Patient Instructions (Signed)
-   low carbohydrates,low saturated fats and high vegetable diet. - continue on current medication

## 2019-12-18 ENCOUNTER — Other Ambulatory Visit: Payer: Medicare Other

## 2019-12-18 NOTE — Telephone Encounter (Signed)
See BMP results 12/17/19-BMP ordered AM of procedure at hospital.

## 2019-12-19 ENCOUNTER — Ambulatory Visit (HOSPITAL_COMMUNITY)
Admission: RE | Disposition: A | Discharge status: Home / Self Care | Payer: Self-pay | Source: Home / Self Care | Attending: Interventional Cardiology

## 2019-12-19 ENCOUNTER — Other Ambulatory Visit: Payer: Self-pay

## 2019-12-19 ENCOUNTER — Inpatient Hospital Stay (HOSPITAL_COMMUNITY)
Admission: RE | Admit: 2019-12-19 | Discharge: 2019-12-23 | DRG: 246 | Disposition: A | Discharge status: Home / Self Care | Payer: Medicare Other | Attending: Cardiology | Admitting: Cardiology

## 2019-12-19 ENCOUNTER — Ambulatory Visit: Payer: Medicare Other | Admitting: Nurse Practitioner

## 2019-12-19 DIAGNOSIS — I272 Pulmonary hypertension, unspecified: Secondary | ICD-10-CM | POA: Diagnosis present

## 2019-12-19 DIAGNOSIS — D509 Iron deficiency anemia, unspecified: Secondary | ICD-10-CM

## 2019-12-19 DIAGNOSIS — Z794 Long term (current) use of insulin: Secondary | ICD-10-CM

## 2019-12-19 DIAGNOSIS — N186 End stage renal disease: Secondary | ICD-10-CM | POA: Diagnosis present

## 2019-12-19 DIAGNOSIS — D649 Anemia, unspecified: Secondary | ICD-10-CM

## 2019-12-19 DIAGNOSIS — I25710 Atherosclerosis of autologous vein coronary artery bypass graft(s) with unstable angina pectoris: Secondary | ICD-10-CM

## 2019-12-19 DIAGNOSIS — F17210 Nicotine dependence, cigarettes, uncomplicated: Secondary | ICD-10-CM | POA: Diagnosis present

## 2019-12-19 DIAGNOSIS — N2581 Secondary hyperparathyroidism of renal origin: Secondary | ICD-10-CM | POA: Diagnosis present

## 2019-12-19 DIAGNOSIS — Z21 Asymptomatic human immunodeficiency virus [HIV] infection status: Secondary | ICD-10-CM | POA: Diagnosis present

## 2019-12-19 DIAGNOSIS — R9439 Abnormal result of other cardiovascular function study: Secondary | ICD-10-CM | POA: Diagnosis present

## 2019-12-19 DIAGNOSIS — K31811 Angiodysplasia of stomach and duodenum with bleeding: Secondary | ICD-10-CM | POA: Diagnosis present

## 2019-12-19 DIAGNOSIS — I5022 Chronic systolic (congestive) heart failure: Secondary | ICD-10-CM

## 2019-12-19 DIAGNOSIS — J449 Chronic obstructive pulmonary disease, unspecified: Secondary | ICD-10-CM | POA: Diagnosis present

## 2019-12-19 DIAGNOSIS — R0602 Shortness of breath: Secondary | ICD-10-CM | POA: Diagnosis not present

## 2019-12-19 DIAGNOSIS — Z951 Presence of aortocoronary bypass graft: Secondary | ICD-10-CM

## 2019-12-19 DIAGNOSIS — Z820 Family history of epilepsy and other diseases of the nervous system: Secondary | ICD-10-CM

## 2019-12-19 DIAGNOSIS — K31819 Angiodysplasia of stomach and duodenum without bleeding: Secondary | ICD-10-CM

## 2019-12-19 DIAGNOSIS — D631 Anemia in chronic kidney disease: Secondary | ICD-10-CM | POA: Diagnosis present

## 2019-12-19 DIAGNOSIS — I25110 Atherosclerotic heart disease of native coronary artery with unstable angina pectoris: Secondary | ICD-10-CM

## 2019-12-19 DIAGNOSIS — Z79899 Other long term (current) drug therapy: Secondary | ICD-10-CM

## 2019-12-19 DIAGNOSIS — Z87442 Personal history of urinary calculi: Secondary | ICD-10-CM

## 2019-12-19 DIAGNOSIS — I429 Cardiomyopathy, unspecified: Secondary | ICD-10-CM

## 2019-12-19 DIAGNOSIS — Z823 Family history of stroke: Secondary | ICD-10-CM

## 2019-12-19 DIAGNOSIS — E785 Hyperlipidemia, unspecified: Secondary | ICD-10-CM | POA: Diagnosis present

## 2019-12-19 DIAGNOSIS — Z20822 Contact with and (suspected) exposure to covid-19: Secondary | ICD-10-CM | POA: Diagnosis present

## 2019-12-19 DIAGNOSIS — I132 Hypertensive heart and chronic kidney disease with heart failure and with stage 5 chronic kidney disease, or end stage renal disease: Secondary | ICD-10-CM | POA: Diagnosis present

## 2019-12-19 DIAGNOSIS — Z8719 Personal history of other diseases of the digestive system: Secondary | ICD-10-CM

## 2019-12-19 DIAGNOSIS — I252 Old myocardial infarction: Secondary | ICD-10-CM

## 2019-12-19 DIAGNOSIS — E1122 Type 2 diabetes mellitus with diabetic chronic kidney disease: Secondary | ICD-10-CM | POA: Diagnosis present

## 2019-12-19 DIAGNOSIS — D62 Acute posthemorrhagic anemia: Secondary | ICD-10-CM

## 2019-12-19 DIAGNOSIS — E1142 Type 2 diabetes mellitus with diabetic polyneuropathy: Secondary | ICD-10-CM | POA: Diagnosis present

## 2019-12-19 DIAGNOSIS — Z833 Family history of diabetes mellitus: Secondary | ICD-10-CM

## 2019-12-19 DIAGNOSIS — I5020 Unspecified systolic (congestive) heart failure: Secondary | ICD-10-CM | POA: Diagnosis present

## 2019-12-19 DIAGNOSIS — K5730 Diverticulosis of large intestine without perforation or abscess without bleeding: Secondary | ICD-10-CM | POA: Diagnosis present

## 2019-12-19 DIAGNOSIS — Z8249 Family history of ischemic heart disease and other diseases of the circulatory system: Secondary | ICD-10-CM

## 2019-12-19 DIAGNOSIS — I2510 Atherosclerotic heart disease of native coronary artery without angina pectoris: Principal | ICD-10-CM | POA: Diagnosis present

## 2019-12-19 DIAGNOSIS — K921 Melena: Secondary | ICD-10-CM

## 2019-12-19 DIAGNOSIS — I255 Ischemic cardiomyopathy: Secondary | ICD-10-CM | POA: Diagnosis present

## 2019-12-19 DIAGNOSIS — Z7982 Long term (current) use of aspirin: Secondary | ICD-10-CM

## 2019-12-19 DIAGNOSIS — Z992 Dependence on renal dialysis: Secondary | ICD-10-CM

## 2019-12-19 HISTORY — PX: INTRAVASCULAR ULTRASOUND/IVUS: CATH118244

## 2019-12-19 HISTORY — PX: RIGHT/LEFT HEART CATH AND CORONARY/GRAFT ANGIOGRAPHY: CATH118267

## 2019-12-19 HISTORY — PX: CORONARY STENT INTERVENTION: CATH118234

## 2019-12-19 LAB — CBC
HCT: 29.7 % — ABNORMAL LOW (ref 39.0–52.0)
Hemoglobin: 9.3 g/dL — ABNORMAL LOW (ref 13.0–17.0)
MCH: 27.2 pg (ref 26.0–34.0)
MCHC: 31.3 g/dL (ref 30.0–36.0)
MCV: 86.8 fL (ref 80.0–100.0)
Platelets: 149 10*3/uL — ABNORMAL LOW (ref 150–400)
RBC: 3.42 MIL/uL — ABNORMAL LOW (ref 4.22–5.81)
RDW: 18.6 % — ABNORMAL HIGH (ref 11.5–15.5)
WBC: 3.5 10*3/uL — ABNORMAL LOW (ref 4.0–10.5)
nRBC: 0 % (ref 0.0–0.2)

## 2019-12-19 LAB — POCT I-STAT 7, (LYTES, BLD GAS, ICA,H+H)
Acid-Base Excess: 8 mmol/L — ABNORMAL HIGH (ref 0.0–2.0)
Bicarbonate: 31.6 mmol/L — ABNORMAL HIGH (ref 20.0–28.0)
Calcium, Ion: 1.1 mmol/L — ABNORMAL LOW (ref 1.15–1.40)
HCT: 28 % — ABNORMAL LOW (ref 39.0–52.0)
Hemoglobin: 9.5 g/dL — ABNORMAL LOW (ref 13.0–17.0)
O2 Saturation: 99 %
Potassium: 3 mmol/L — ABNORMAL LOW (ref 3.5–5.1)
Sodium: 137 mmol/L (ref 135–145)
TCO2: 33 mmol/L — ABNORMAL HIGH (ref 22–32)
pCO2 arterial: 41.5 mmHg (ref 32.0–48.0)
pH, Arterial: 7.49 — ABNORMAL HIGH (ref 7.350–7.450)
pO2, Arterial: 150 mmHg — ABNORMAL HIGH (ref 83.0–108.0)

## 2019-12-19 LAB — POCT ACTIVATED CLOTTING TIME
Activated Clotting Time: 136 seconds
Activated Clotting Time: 241 seconds
Activated Clotting Time: 274 seconds
Activated Clotting Time: 340 seconds
Activated Clotting Time: 241 seconds
Activated Clotting Time: 180 seconds
Activated Clotting Time: 197 seconds

## 2019-12-19 LAB — BASIC METABOLIC PANEL
Anion gap: 13 (ref 5–15)
BUN: 34 mg/dL — ABNORMAL HIGH (ref 8–23)
CO2: 29 mmol/L (ref 22–32)
Calcium: 8.8 mg/dL — ABNORMAL LOW (ref 8.9–10.3)
Chloride: 94 mmol/L — ABNORMAL LOW (ref 98–111)
Creatinine, Ser: 5.1 mg/dL — ABNORMAL HIGH (ref 0.61–1.24)
GFR calc Af Amer: 12 mL/min — ABNORMAL LOW (ref >60)
GFR calc non Af Amer: 11 mL/min — ABNORMAL LOW (ref >60)
Glucose, Bld: 133 mg/dL — ABNORMAL HIGH (ref 70–99)
Potassium: 5.1 mmol/L (ref 3.5–5.1)
Sodium: 136 mmol/L (ref 135–145)

## 2019-12-19 LAB — GLUCOSE, CAPILLARY
Glucose-Capillary: 128 mg/dL — ABNORMAL HIGH (ref 70–99)
Glucose-Capillary: 88 mg/dL (ref 70–99)
Glucose-Capillary: 141 mg/dL — ABNORMAL HIGH (ref 70–99)
Glucose-Capillary: 140 mg/dL — ABNORMAL HIGH (ref 70–99)

## 2019-12-19 LAB — POCT I-STAT EG7
Acid-Base Excess: 7 mmol/L — ABNORMAL HIGH (ref 0.0–2.0)
Bicarbonate: 32.1 mmol/L — ABNORMAL HIGH (ref 20.0–28.0)
Calcium, Ion: 1.13 mmol/L — ABNORMAL LOW (ref 1.15–1.40)
HCT: 30 % — ABNORMAL LOW (ref 39.0–52.0)
Hemoglobin: 10.2 g/dL — ABNORMAL LOW (ref 13.0–17.0)
O2 Saturation: 67 %
Potassium: 3.1 mmol/L — ABNORMAL LOW (ref 3.5–5.1)
Sodium: 138 mmol/L (ref 135–145)
TCO2: 33 mmol/L — ABNORMAL HIGH (ref 22–32)
pCO2, Ven: 46.7 mmHg (ref 44.0–60.0)
pH, Ven: 7.445 — ABNORMAL HIGH (ref 7.250–7.430)
pO2, Ven: 34 mmHg (ref 32.0–45.0)

## 2019-12-19 SURGERY — RIGHT/LEFT HEART CATH AND CORONARY/GRAFT ANGIOGRAPHY
Anesthesia: LOCAL

## 2019-12-19 MED ORDER — COLCHICINE 0.6 MG PO TABS: 0.6000 mg | ORAL_TABLET | Freq: Every day | ORAL | Status: DC | PRN

## 2019-12-19 MED ORDER — SODIUM CHLORIDE 0.9% FLUSH: 3.0000 mL | INTRAVENOUS | Status: DC | PRN

## 2019-12-19 MED ORDER — ASPIRIN EC 81 MG PO TBEC: 81.0000 mg | DELAYED_RELEASE_TABLET | Freq: Every day | ORAL | Status: DC

## 2019-12-19 MED ORDER — CLOPIDOGREL BISULFATE 300 MG PO TABS
ORAL_TABLET | ORAL | Status: AC
  Filled 2019-12-19: qty 2

## 2019-12-19 MED ORDER — SIMETHICONE 80 MG PO CHEW: 80.0000 mg | CHEWABLE_TABLET | Freq: Four times a day (QID) | ORAL | Status: DC | PRN

## 2019-12-19 MED ORDER — ISOSORBIDE MONONITRATE ER 30 MG PO TB24
30.0000 mg | ORAL_TABLET | Freq: Every day | ORAL | Status: DC
  Administered 2019-12-19 – 2019-12-23 (×5): 30 mg via ORAL
  Filled 2019-12-19 (×5): qty 1

## 2019-12-19 MED ORDER — ONDANSETRON HCL 4 MG/2ML IJ SOLN
4.0000 mg | Freq: Four times a day (QID) | INTRAMUSCULAR | Status: DC | PRN
  Administered 2019-12-19: 4 mg via INTRAVENOUS

## 2019-12-19 MED ORDER — FENOFIBRATE 160 MG PO TABS
160.0000 mg | ORAL_TABLET | Freq: Every day | ORAL | Status: DC
  Administered 2019-12-19 – 2019-12-23 (×5): 160 mg via ORAL
  Filled 2019-12-19 (×5): qty 1

## 2019-12-19 MED ORDER — IOHEXOL 350 MG/ML SOLN
INTRAVENOUS | Status: AC
  Filled 2019-12-19: qty 1

## 2019-12-19 MED ORDER — LIDOCAINE HCL (PF) 1 % IJ SOLN
INTRAMUSCULAR | Status: DC | PRN
  Administered 2019-12-19: 15 mL

## 2019-12-19 MED ORDER — ASPIRIN 81 MG PO CHEW
81.0000 mg | CHEWABLE_TABLET | Freq: Every day | ORAL | Status: DC
  Administered 2019-12-20 – 2019-12-23 (×4): 81 mg via ORAL
  Filled 2019-12-19 (×4): qty 1

## 2019-12-19 MED ORDER — HEPARIN (PORCINE) IN NACL 1000-0.9 UT/500ML-% IV SOLN
INTRAVENOUS | Status: DC | PRN
  Administered 2019-12-19: 500 mL

## 2019-12-19 MED ORDER — SODIUM CHLORIDE 0.9 % IV SOLN: 250.0000 mL | INTRAVENOUS | Status: DC | PRN

## 2019-12-19 MED ORDER — LIDOCAINE HCL (PF) 1 % IJ SOLN
INTRAMUSCULAR | Status: AC
  Filled 2019-12-19: qty 30

## 2019-12-19 MED ORDER — ASPIRIN 81 MG PO CHEW: 81.0000 mg | CHEWABLE_TABLET | ORAL | Status: DC

## 2019-12-19 MED ORDER — POLYETHYLENE GLYCOL 3350 17 G PO PACK: 17.0000 g | PACK | Freq: Every day | ORAL | Status: DC | PRN

## 2019-12-19 MED ORDER — UMECLIDINIUM-VILANTEROL 62.5-25 MCG/INH IN AEPB
1.0000 | INHALATION_SPRAY | Freq: Every day | RESPIRATORY_TRACT | Status: DC
  Administered 2019-12-20 – 2019-12-23 (×3): 1 via RESPIRATORY_TRACT
  Filled 2019-12-19: qty 14

## 2019-12-19 MED ORDER — IOHEXOL 350 MG/ML SOLN
INTRAVENOUS | Status: DC | PRN
  Administered 2019-12-19: 09:00:00 83 mL

## 2019-12-19 MED ORDER — VERAPAMIL HCL 2.5 MG/ML IV SOLN
INTRAVENOUS | Status: DC | PRN
  Administered 2019-12-19 (×4): 200 ug via INTRACORONARY

## 2019-12-19 MED ORDER — ACETAMINOPHEN 500 MG PO TABS: 1000.0000 mg | ORAL_TABLET | Freq: Four times a day (QID) | ORAL | Status: DC | PRN

## 2019-12-19 MED ORDER — MORPHINE SULFATE (PF) 2 MG/ML IV SOLN
INTRAVENOUS | Status: AC
  Filled 2019-12-19: qty 1

## 2019-12-19 MED ORDER — GABAPENTIN 300 MG PO CAPS
300.0000 mg | ORAL_CAPSULE | Freq: Every day | ORAL | Status: DC
  Administered 2019-12-19 – 2019-12-23 (×5): 300 mg via ORAL
  Filled 2019-12-19 (×5): qty 1

## 2019-12-19 MED ORDER — HEPARIN SODIUM (PORCINE) 5000 UNIT/ML IJ SOLN
5000.0000 [IU] | Freq: Three times a day (TID) | INTRAMUSCULAR | Status: DC
  Administered 2019-12-19 – 2019-12-20 (×2): 5000 [IU] via SUBCUTANEOUS
  Filled 2019-12-19: qty 1

## 2019-12-19 MED ORDER — MORPHINE SULFATE (PF) 2 MG/ML IV SOLN
2.0000 mg | Freq: Once | INTRAVENOUS | Status: AC
  Administered 2019-12-19: 11:00:00 2 mg via INTRAVENOUS

## 2019-12-19 MED ORDER — TIMOLOL MALEATE 0.5 % OP SOLN
1.0000 [drp] | Freq: Every day | OPHTHALMIC | Status: DC
  Administered 2019-12-21 – 2019-12-22 (×2): 1 [drp] via OPHTHALMIC
  Filled 2019-12-19: qty 5

## 2019-12-19 MED ORDER — LATANOPROST 0.005 % OP SOLN
1.0000 [drp] | Freq: Every day | OPHTHALMIC | Status: DC
  Administered 2019-12-19 – 2019-12-22 (×4): 1 [drp] via OPHTHALMIC
  Filled 2019-12-19 (×2): qty 2.5

## 2019-12-19 MED ORDER — INSULIN ASPART 100 UNIT/ML ~~LOC~~ SOLN: 0.0000 [IU] | Freq: Every day | SUBCUTANEOUS | Status: DC

## 2019-12-19 MED ORDER — TICAGRELOR 90 MG PO TABS
ORAL_TABLET | ORAL | Status: DC | PRN
  Administered 2019-12-19: 180 mg via ORAL

## 2019-12-19 MED ORDER — NITROGLYCERIN 0.4 MG SL SUBL
0.4000 mg | SUBLINGUAL_TABLET | SUBLINGUAL | Status: DC | PRN
  Administered 2019-12-19: 16:00:00 0.4 mg via SUBLINGUAL
  Filled 2019-12-19: qty 1

## 2019-12-19 MED ORDER — FENTANYL CITRATE (PF) 100 MCG/2ML IJ SOLN
INTRAMUSCULAR | Status: AC
  Filled 2019-12-19: qty 2

## 2019-12-19 MED ORDER — ANGIOPLASTY BOOK
Freq: Once | Status: AC
  Filled 2019-12-19: qty 1

## 2019-12-19 MED ORDER — SODIUM CHLORIDE 0.9 % IV SOLN: INTRAVENOUS | Status: DC

## 2019-12-19 MED ORDER — SEVELAMER CARBONATE 800 MG PO TABS
800.0000 mg | ORAL_TABLET | Freq: Three times a day (TID) | ORAL | Status: DC
  Administered 2019-12-19 – 2019-12-23 (×8): 800 mg via ORAL
  Filled 2019-12-19 (×8): qty 1

## 2019-12-19 MED ORDER — BICTEGRAVIR-EMTRICITAB-TENOFOV 50-200-25 MG PO TABS
1.0000 | ORAL_TABLET | Freq: Every day | ORAL | Status: DC
  Administered 2019-12-19 – 2019-12-23 (×5): 1 via ORAL
  Filled 2019-12-19 (×6): qty 1

## 2019-12-19 MED ORDER — VERAPAMIL HCL 2.5 MG/ML IV SOLN
INTRAVENOUS | Status: AC
  Filled 2019-12-19: qty 2

## 2019-12-19 MED ORDER — MIDAZOLAM HCL 2 MG/2ML IJ SOLN
INTRAMUSCULAR | Status: AC
  Filled 2019-12-19: qty 2

## 2019-12-19 MED ORDER — ONETOUCH DELICA PLUS LANCET33G MISC: 1.0000 | Freq: Three times a day (TID) | Status: DC

## 2019-12-19 MED ORDER — ALLOPURINOL 100 MG PO TABS
100.0000 mg | ORAL_TABLET | Freq: Every day | ORAL | Status: DC
  Administered 2019-12-19 – 2019-12-23 (×5): 100 mg via ORAL
  Filled 2019-12-19 (×5): qty 1

## 2019-12-19 MED ORDER — DOCUSATE SODIUM 100 MG PO CAPS: 100.0000 mg | ORAL_CAPSULE | Freq: Every day | ORAL | Status: DC | PRN

## 2019-12-19 MED ORDER — MIDAZOLAM HCL 2 MG/2ML IJ SOLN
INTRAMUSCULAR | Status: DC | PRN
  Administered 2019-12-19: 0.5 mg via INTRAVENOUS

## 2019-12-19 MED ORDER — INSULIN ASPART 100 UNIT/ML ~~LOC~~ SOLN
0.0000 [IU] | Freq: Three times a day (TID) | SUBCUTANEOUS | Status: DC
  Administered 2019-12-20 – 2019-12-21 (×2): 3 [IU] via SUBCUTANEOUS
  Administered 2019-12-22: 2 [IU] via SUBCUTANEOUS

## 2019-12-19 MED ORDER — INSULIN LISPRO 100 UNIT/ML ~~LOC~~ SOLN: 20.0000 [IU] | Freq: Three times a day (TID) | SUBCUTANEOUS | Status: DC | PRN

## 2019-12-19 MED ORDER — HEPARIN SODIUM (PORCINE) 1000 UNIT/ML IJ SOLN
INTRAMUSCULAR | Status: DC | PRN
  Administered 2019-12-19: 3000 [IU] via INTRAVENOUS
  Administered 2019-12-19: 9000 [IU] via INTRAVENOUS
  Administered 2019-12-19: 5000 [IU] via INTRAVENOUS

## 2019-12-19 MED ORDER — HEPARIN (PORCINE) IN NACL 1000-0.9 UT/500ML-% IV SOLN
INTRAVENOUS | Status: AC
  Filled 2019-12-19: qty 1000

## 2019-12-19 MED ORDER — LABETALOL HCL 5 MG/ML IV SOLN: 10.0000 mg | INTRAVENOUS | Status: AC | PRN

## 2019-12-19 MED ORDER — SODIUM CHLORIDE 0.9% FLUSH
3.0000 mL | Freq: Two times a day (BID) | INTRAVENOUS | Status: DC
  Administered 2019-12-19 – 2019-12-22 (×5): 3 mL via INTRAVENOUS

## 2019-12-19 MED ORDER — MELATONIN 3 MG PO TABS
3.0000 mg | ORAL_TABLET | Freq: Every day | ORAL | Status: DC
  Administered 2019-12-19: 22:00:00 3 mg via ORAL
  Filled 2019-12-19 (×5): qty 1

## 2019-12-19 MED ORDER — TICAGRELOR 90 MG PO TABS
90.0000 mg | ORAL_TABLET | Freq: Two times a day (BID) | ORAL | Status: DC
  Administered 2019-12-19 – 2019-12-23 (×8): 90 mg via ORAL
  Filled 2019-12-19 (×8): qty 1

## 2019-12-19 MED ORDER — PANTOPRAZOLE SODIUM 40 MG PO TBEC
40.0000 mg | DELAYED_RELEASE_TABLET | Freq: Every day | ORAL | Status: DC
  Administered 2019-12-19 – 2019-12-21 (×3): 40 mg via ORAL
  Filled 2019-12-19 (×3): qty 1

## 2019-12-19 MED ORDER — ATORVASTATIN CALCIUM 80 MG PO TABS
80.0000 mg | ORAL_TABLET | Freq: Every day | ORAL | Status: DC
  Administered 2019-12-19 – 2019-12-22 (×4): 80 mg via ORAL
  Filled 2019-12-19 (×3): qty 1

## 2019-12-19 MED ORDER — TICAGRELOR 90 MG PO TABS
ORAL_TABLET | ORAL | Status: AC
  Filled 2019-12-19: qty 2

## 2019-12-19 MED ORDER — ONDANSETRON HCL 4 MG/2ML IJ SOLN
INTRAMUSCULAR | Status: AC
  Filled 2019-12-19: qty 2

## 2019-12-19 MED ORDER — RENA-VITE PO TABS
1.0000 | ORAL_TABLET | Freq: Every day | ORAL | Status: DC
  Administered 2019-12-19 – 2019-12-23 (×5): 1 via ORAL
  Filled 2019-12-19 (×5): qty 1

## 2019-12-19 MED ORDER — HYDRALAZINE HCL 20 MG/ML IJ SOLN: 10.0000 mg | INTRAMUSCULAR | Status: AC | PRN

## 2019-12-19 MED ORDER — FENTANYL CITRATE (PF) 100 MCG/2ML IJ SOLN
INTRAMUSCULAR | Status: DC | PRN
  Administered 2019-12-19: 25 ug via INTRAVENOUS

## 2019-12-19 MED ORDER — ALUM & MAG HYDROXIDE-SIMETH 200-200-20 MG/5ML PO SUSP
30.0000 mL | Freq: Once | ORAL | Status: AC
  Administered 2019-12-19: 17:00:00 30 mL via ORAL
  Filled 2019-12-19: qty 30

## 2019-12-19 MED ORDER — ACETAMINOPHEN 325 MG PO TABS
650.0000 mg | ORAL_TABLET | ORAL | Status: DC | PRN
  Administered 2019-12-19 – 2019-12-21 (×3): 650 mg via ORAL
  Filled 2019-12-19 (×3): qty 2

## 2019-12-19 MED ORDER — HEPARIN SODIUM (PORCINE) 1000 UNIT/ML IJ SOLN
INTRAMUSCULAR | Status: AC
  Filled 2019-12-19: qty 1

## 2019-12-19 SURGICAL SUPPLY — 25 items
BALLN SAPPHIRE 2.0X12 (BALLOONS) ×2
BALLN ~~LOC~~ EMERGE MR 4.0X20 (BALLOONS) ×2
BALLOON SAPPHIRE 2.0X12 (BALLOONS) ×1 IMPLANT
BALLOON ~~LOC~~ EMERGE MR 4.0X20 (BALLOONS) ×1 IMPLANT
CATH DXT MULTI JL4 JR4 ANG PIG (CATHETERS) ×2 IMPLANT
CATH LAUNCHER 6FR AL1 (CATHETERS) ×1 IMPLANT
CATH OPTICROSS HD (CATHETERS) ×2 IMPLANT
CATH SWAN GANZ 7F STRAIGHT (CATHETERS) ×4 IMPLANT
CATHETER LAUNCHER 6FR AL1 (CATHETERS) ×2
KIT ENCORE 26 ADVANTAGE (KITS) ×2 IMPLANT
KIT HEART LEFT (KITS) ×2 IMPLANT
KIT HEMO VALVE WATCHDOG (MISCELLANEOUS) ×2 IMPLANT
PACK CARDIAC CATHETERIZATION (CUSTOM PROCEDURE TRAY) ×2 IMPLANT
SHEATH PINNACLE 5F 10CM (SHEATH) ×2 IMPLANT
SHEATH PINNACLE 6F 10CM (SHEATH) ×2 IMPLANT
SHEATH PINNACLE 7F 10CM (SHEATH) ×2 IMPLANT
SHEATH PROBE COVER 6X72 (BAG) ×2 IMPLANT
SLED PULL BACK IVUS (MISCELLANEOUS) ×2 IMPLANT
STENT SYNERGY XD 3.50X48 (Permanent Stent) ×1 IMPLANT
SYNERGY XD 3.50X48 (Permanent Stent) ×2 IMPLANT
TRANSDUCER W/STOPCOCK (MISCELLANEOUS) ×2 IMPLANT
TUBING CIL FLEX 10 FLL-RA (TUBING) ×2 IMPLANT
WIRE ASAHI PROWATER 180CM (WIRE) ×2 IMPLANT
WIRE EMERALD 3MM-J .025X260CM (WIRE) ×2 IMPLANT
WIRE EMERALD 3MM-J .035X150CM (WIRE) ×2 IMPLANT

## 2019-12-19 NOTE — Progress Notes (Signed)
Pt reports 7/10 L sided chest pain. Pt medicated in cath lab holding area for this and MD aware. Pt given Tylenol w/ no relief. PA paged d/t pain worsening. Nitro given w/ no relief. EKG being obtained. VSS- Will continue to monitor.

## 2019-12-19 NOTE — Progress Notes (Signed)
Site area: right groin  Site Prior to Removal:  Level 0  Pressure Applied For 25 MINUTES    Minutes Beginning at 1215  Manual:   Yes.    Patient Status During Pull:  Stable but restless.   Post Pull Groin Site:  Level 0  Post Pull Instructions Given:  Yes.    Post Pull Pulses Present:  Yes.    Dressing Applied:  Yes.    Comments:  Bed rest started at 1240 X 4 hr.

## 2019-12-19 NOTE — Progress Notes (Addendum)
    Called by nurse due to NSVT and intermittent chest pain. Patient reporting SHOB post procedure.  He is also annoyed that he had to stay in the [place].  He makes it clear that he does not want to do dialysis at [facility] and wants to go to his appt at [facility]. Dialysis tomorrow.   GEN: Well nourished, well developed, annoyed HEENT: normal  Neck: no JVD, carotid bruits, or masses Cardiac: RRR; no murmurs, rubs, or gallops,no edema  Respiratory:  clear to auscultation bilaterally, normal work of breathing GI: soft, nontender, nondistended, obese MS: no deformity or atrophy ; no right groin hematoma Skin: warm and dry, no rash Neuro:  Strength and sensation are intact Psych: irritated   Elevated LVEDP at cath.  ECG unremarkable.  I don't think he is having acute ischemia.  Oxygenating well.   1) Sx appear to be from volume overload, not stent thrombosis.  LVEDP 30 mm Hg.  PCWP elevated suggestive of volume overload.  2) Will try to discharge early tomorrow so he can go to his scheduled Friday dialysis.  If he cannot be discharged, contact Dr. Melvia Heaps to arrange for inpatient dialysis.  I discussed this plan with Dr. Posey Pronto of nephrology and Dr. Ellyn Hack who will see the patient in the morning.    3) EF of 25% may be limiting fluid removal at dialysis.  Given volume overload, he would benefit from additional volume removal to breathe better.   4) If resp status worsens tonight, would have to consider emergent dialysis.  Marland KitchenCRITICAL CARE Performed by: Larae Grooms   Total critical care time: 35 minutes  Critical care time was exclusive of separately billable procedures and treating other patients.  Critical care was necessary to treat or prevent imminent or life-threatening deterioration.  Critical care was time spent personally by me on the following activities: development of treatment plan with patient and/or surrogate as well as nursing, discussions with consultants, evaluation of  patient's response to treatment, examination of patient, obtaining history from patient or surrogate, ordering and performing treatments and interventions, ordering and review of laboratory studies, ordering and review of radiographic studies, pulse oximetry and re-evaluation of patient's condition.   Jettie Booze, MD

## 2019-12-19 NOTE — Interval H&P Note (Signed)
Cath Lab Visit (complete for each Cath Lab visit)  Clinical Evaluation Leading to the Procedure:   ACS: No.  Non-ACS:    Anginal Classification: CCS III  Anti-ischemic medical therapy: Minimal Therapy (1 class of medications)  Non-Invasive Test Results: High-risk stress test findings: cardiac mortality >3%/year  Prior CABG: Previous CABG      History and Physical Interval Note:  12/19/2019 7:31 AM  Jacob [name]  has presented today for surgery, with the diagnosis of cardiomyopathy.  The various methods of treatment have been discussed with the patient and family. After consideration of risks, benefits and other options for treatment, the patient has consented to  Procedure(s): RIGHT/LEFT HEART CATH AND CORONARY/GRAFT ANGIOGRAPHY (N/A) as a surgical intervention.  The patient's history has been reviewed, patient examined, no change in status, stable for surgery.  I have reviewed the patient's chart and labs.  Questions were answered to the patient's satisfaction.     Larae Grooms

## 2019-12-19 NOTE — Progress Notes (Signed)
   Notified by RN that patient reports constant chest pain since heart catheterization this afternoon. Trialed morphine, tylenol, and SL nitro without relief of symptoms. He describes the pain as tightness with some SOB which comes in waves but never goes away completely. States it does not feel like symptoms that prompted his heart catheterization.   Vitals with BMI 12/19/2019 12/19/2019 12/19/2019  Height - - -  Weight - - -  BMI - - -  Systolic [phone]  Diastolic 66 60 68  Pulse 56 54 66   Vitals are stable. EKG without changes from previous - no STE/D, chronic TWI. Physical exam benign, right groin site without hematoma or bleeding.   Will trial GI cocktail for possible reflux etiology. Can continue prn morphine as well.   Of note, patient is very anxious about being discharged in time for his 11am outpatient dialysis session. Suspect this is contributing to his discomfort as well.   Dr. Irish Lack updated and in agreement with the plan.   Abigail Butts, PA-C 12/19/19 4:45 PM

## 2019-12-19 NOTE — Progress Notes (Signed)
Pt had 4 beat run of vtach. Pt VSS. On call MD made aware.

## 2019-12-19 NOTE — Plan of Care (Signed)
°  Problem: Education: °Goal: Knowledge of General Education information will improve °Description: Including pain rating scale, medication(s)/side effects and non-pharmacologic comfort measures °Outcome: Progressing °  °Problem: Nutrition: °Goal: Adequate nutrition will be maintained °Outcome: Progressing °  °Problem: Coping: °Goal: Level of anxiety will decrease °Outcome: Progressing °  °Problem: Pain Managment: °Goal: General experience of comfort will improve °Outcome: Progressing °  °Problem: Safety: °Goal: Ability to remain free from injury will improve °Outcome: Progressing °  °Problem: Skin Integrity: °Goal: Risk for impaired skin integrity will decrease °Outcome: Progressing °  °

## 2019-12-20 ENCOUNTER — Ambulatory Visit (HOSPITAL_BASED_OUTPATIENT_CLINIC_OR_DEPARTMENT_OTHER): Payer: Medicare Other

## 2019-12-20 DIAGNOSIS — Z9889 Other specified postprocedural states: Secondary | ICD-10-CM

## 2019-12-20 LAB — CBC
HCT: 22.5 % — ABNORMAL LOW (ref 39.0–52.0)
HCT: 23.1 % — ABNORMAL LOW (ref 39.0–52.0)
Hemoglobin: 7 g/dL — ABNORMAL LOW (ref 13.0–17.0)
Hemoglobin: 7.3 g/dL — ABNORMAL LOW (ref 13.0–17.0)
MCH: 27.3 pg (ref 26.0–34.0)
MCH: 27.3 pg (ref 26.0–34.0)
MCHC: 31.1 g/dL (ref 30.0–36.0)
MCHC: 31.6 g/dL (ref 30.0–36.0)
MCV: 87.9 fL (ref 80.0–100.0)
MCV: 86.5 fL (ref 80.0–100.0)
Platelets: 142 K/uL — ABNORMAL LOW (ref 150–400)
Platelets: 147 10*3/uL — ABNORMAL LOW (ref 150–400)
RBC: 2.56 MIL/uL — ABNORMAL LOW (ref 4.22–5.81)
RBC: 2.67 MIL/uL — ABNORMAL LOW (ref 4.22–5.81)
RDW: 19 % — ABNORMAL HIGH (ref 11.5–15.5)
RDW: 19 % — ABNORMAL HIGH (ref 11.5–15.5)
WBC: 4.7 K/uL (ref 4.0–10.5)
WBC: 4.8 10*3/uL (ref 4.0–10.5)
nRBC: 0 % (ref 0.0–0.2)
nRBC: 0 % (ref 0.0–0.2)

## 2019-12-20 LAB — BASIC METABOLIC PANEL WITH GFR
Anion gap: 12 (ref 5–15)
BUN: 76 mg/dL — ABNORMAL HIGH (ref 8–23)
CO2: 28 mmol/L (ref 22–32)
Calcium: 8.4 mg/dL — ABNORMAL LOW (ref 8.9–10.3)
Chloride: 99 mmol/L (ref 98–111)
Creatinine, Ser: 5.94 mg/dL — ABNORMAL HIGH (ref 0.61–1.24)
GFR calc Af Amer: 10 mL/min — ABNORMAL LOW (ref >60)
GFR calc non Af Amer: 9 mL/min — ABNORMAL LOW (ref >60)
Glucose, Bld: 133 mg/dL — ABNORMAL HIGH (ref 70–99)
Potassium: 3.6 mmol/L (ref 3.5–5.1)
Sodium: 139 mmol/L (ref 135–145)

## 2019-12-20 LAB — GLUCOSE, CAPILLARY
Glucose-Capillary: 180 mg/dL — ABNORMAL HIGH (ref 70–99)
Glucose-Capillary: 152 mg/dL — ABNORMAL HIGH (ref 70–99)
Glucose-Capillary: 162 mg/dL — ABNORMAL HIGH (ref 70–99)

## 2019-12-20 LAB — PREPARE RBC (CROSSMATCH)

## 2019-12-20 LAB — OCCULT BLOOD X 1 CARD TO LAB, STOOL: Fecal Occult Bld: POSITIVE — AB

## 2019-12-20 MED ORDER — SODIUM CHLORIDE 0.9% IV SOLUTION: Freq: Once | INTRAVENOUS | Status: DC

## 2019-12-20 MED ORDER — TICAGRELOR 90 MG PO TABS: 90.0000 mg | ORAL_TABLET | Freq: Two times a day (BID) | ORAL | 1 refills | Status: DC

## 2019-12-20 MED ORDER — CALCITRIOL 0.5 MCG PO CAPS
ORAL_CAPSULE | ORAL | Status: AC
  Administered 2019-12-20: 1 ug via ORAL
  Filled 2019-12-20: qty 2

## 2019-12-20 MED ORDER — CALCITRIOL 0.5 MCG PO CAPS
1.0000 ug | ORAL_CAPSULE | ORAL | Status: DC
  Administered 2019-12-23: 1 ug via ORAL

## 2019-12-20 MED ORDER — DICLOFENAC SODIUM 1 % EX GEL
2.0000 g | Freq: Three times a day (TID) | CUTANEOUS | Status: DC | PRN
  Administered 2019-12-20: 2 g via TOPICAL
  Filled 2019-12-20: qty 100

## 2019-12-20 MED ORDER — CHLORHEXIDINE GLUCONATE CLOTH 2 % EX PADS: 6.0000 | MEDICATED_PAD | Freq: Every day | CUTANEOUS | Status: DC

## 2019-12-20 MED FILL — Clopidogrel Bisulfate Tab 300 MG (Base Equiv): ORAL | Qty: 1 | Status: AC

## 2019-12-20 MED FILL — BRILINTA 90 MG TABLET: 90 | 30 days supply | Qty: 60 | Fill #0

## 2019-12-20 NOTE — Discharge Summary (Signed)
Discharge Summary    [name],  MRN: [id number], DOB/AGE: 1950/11/03 69 y.o.  Admit date: 12/19/2019 Discharge date: 12/23/2019  Primary Care Provider: Lauree [name] Primary Cardiologist: Jacob Rouge, MD  Discharge Diagnoses    Active Problems:   CAD (coronary artery disease)   Melena   Abnormal nuclear stress test   Iron deficiency anemia   Cardiomyopathy ([place])   Acute blood loss anemia   Acute on chronic anemia   AVM (arteriovenous malformation) of duodenum, acquired   Allergies Allergies  Allergen Reactions  . Augmentin [Amoxicillin-Pot Clavulanate] Diarrhea    Severe  . Amphetamines Other (See Comments)    Unknown reaction type    Diagnostic Studies/Procedures    Cath: 12/19/19   Prox RCA lesion is 100% stenosed. SVG to RCA is widely patent.  Dist LAD lesion is 90% stenosed. Too small and distal for PCI.  3rd Diag lesion is 50% stenosed.  Prox Cx lesion is 100% stenosed. Left to left collaterals.  Ramus lesion is 100% stenosed.  SVG to ramus with Origin to Prox Graft lesion is 95% stenosed.  A drug-eluting stent was successfully placed using a SYNERGY XD 3.50X48, postdilated to >4 mm. Optimized with IVUS.  Post intervention, there is a 0% residual stenosis.  There is moderate to severe left ventricular systolic dysfunction.  There is no aortic valve stenosis.  Hemodynamic findings consistent with moderate pulmonary hypertension.  Continue dual antiplatelet therapy for 6 months at least. COnsider clopidogrel longer term if there is no interaction with his HIV meds, given the diffuse nature of CAD.   Diagnostic Dominance: Right  Intervention     _____________   History of Present Illness     Jacob [name] is a 69 y.o. male with distant CABG in 2004 with EF improving from 24% to 45-50% after revascularization. Last myovue 2016 with scar no ischemia. Had done well with medical Rx and cath avoided due to CRF. Now on  dialysis and followed by Jacob [name]. Had revision of left arm fistula January 2019.  Comorbid conditions include HIV previous drug abuse HTN and HLD.  Echo 02/07/19 EF 40-45% Severe LAE no significant valve disease   Myovue 12/13/18 abnormal EF 26% large inferior wall infarct   He called the office complaining of more episodes of dyspnea 11/26/19. He was seen by Jacob [name] and discussed need for right and left heart cath now that he was on dialysis.   Been on dialysis since October M/WF. Reported his edema was worse on standing.  Cath risks were discussed via Jacob [name] including stroke , MI, worsening urine output, need for emergency surgery and bleeding. He was willing to proceed.   Hospital Course     Consultants: Nephrology, GI  1. CAD s/p CABG: underwent outpatient cardiac cath with PCI/DES x1 to SVG-Ramus, patent SVG-RCA. Placed on DAPT with ASA/Brilinta for at least 6 months. Had some chest pain and shortness of breath post cath, but resolved the following morning. He ambulated with cardiac rehab without complications. Given his GI bleed/anemia ( noted below, ASA was stopped) -- continue Brilinta, on statin, fenofibrate  2. Upper GI Bleed/Anemia: Hgb dropped from 10>>7.3 the morning post cath. Cath site was stable. Reported he has had issues with blood in his stool over the past month. Had a colonoscopy about 5 years prior which was normal per his report. No blood noted overnight. With significant drop in Hgb he had a groin doppler which was negative for  PSA. He received I unit of PRBCs with HD and CBC improved. He continued to have heme + stools and GI consulted. Underwent EGD that showed 4 AVMs/2 bleeding which were treated with APC, clips and epi injection. Hgb 8.3 on the day of discharge.  -- recommendations for Protonix 40mg  BID for 8 weeks, then 40mg  daily. Also Sucralfate suspension 1 gram BID for 2 weeks.  -- outpt GI follow up   3. ESRD on HD: initially planned to discharge home  for outpatient HD but given his decline in H/H nephrology was consulted and he underwent inpt HD.   4. HTN: stable with current therapy.   5. HL: on high dose statin and fenofibrate   Jacob [name] was seen by Jacob [name] and determined stable for discharge home. Follow up in the office has been arranged. Medications are listed below.   _____________  Discharge Vitals Blood pressure (!) 139/93, pulse 93, temperature 98.4 F (36.9 C), temperature source Oral, resp. rate 16, height 6\' 2"  (1.88 m), weight 80 kg, SpO2 100 %.  Filed Weights   12/22/19 0843 12/23/19 0639 12/23/19 0755  Weight: 81.6 kg 82.4 kg 80 kg    Labs & Radiologic Studies    CBC Recent Labs    12/21/19 0352 12/21/19 1327 12/22/19 2112 12/23/19 0657  WBC 5.6  --   --   --   HGB 9.0*   < > 7.5* 8.3*  HCT 28.3*   < > 23.8* 26.9*  MCV 87.9  --   --   --   PLT 158  --   --   --    < > = values in this interval not displayed.   Basic Metabolic Panel Recent Labs    12/21/19 0352 12/23/19 0832  NA 138 136  K 3.8 3.8  CL 99 101  CO2 26 22  GLUCOSE 83 199*  BUN 37* 80*  CREATININE 4.47* 7.28*  CALCIUM 8.9 8.6*  PHOS  --  5.1*   Liver Function Tests Recent Labs    12/23/19 0832  ALBUMIN 2.5*   No results for input(s): LIPASE, AMYLASE in the last 72 hours. Cardiac Enzymes No results for input(s): CKTOTAL, CKMB, CKMBINDEX, TROPONINI in the last 72 hours. BNP Invalid input(s): POCBNP D-Dimer No results for input(s): DDIMER in the last 72 hours. Hemoglobin A1C No results for input(s): HGBA1C in the last 72 hours. Fasting Lipid Panel Recent Labs    12/21/19 0352  CHOL 65  HDL 17*  LDLCALC 29  TRIG 95  CHOLHDL 3.8   Thyroid Function Tests No results for input(s): TSH, T4TOTAL, T3FREE, THYROIDAB in the last 72 hours.  Invalid input(s): FREET3 _____________  CARDIAC CATHETERIZATION  Addendum Date: 12/19/2019    Prox RCA lesion is 100% stenosed. SVG to RCA is widely patent.  Dist LAD  lesion is 90% stenosed. Too small and distal for PCI.  3rd Diag lesion is 50% stenosed.  Prox Cx lesion is 100% stenosed. Left to left collaterals.  Ramus lesion is 100% stenosed.  SVG to ramus with Origin to Prox Graft lesion is 95% stenosed.  A drug-eluting stent was successfully placed using a SYNERGY XD 3.50X48, postdilated to >4 mm. Optimized with IVUS.  Post intervention, there is a 0% residual stenosis.  There is moderate to severe left ventricular systolic dysfunction.  There is no aortic valve stenosis.  Hemodynamic findings consistent with moderate pulmonary hypertension.  Continue dual antiplatelet therapy for 6 months at least.  COnsider clopidogrel longer  term if there is no interaction with his HIV meds, given the diffuse nature of CAD.   Result Date: 12/19/2019  Prox RCA lesion is 100% stenosed. SVG to RCA is widely patent.  Dist LAD lesion is 90% stenosed. Too small and distal for PCI.  3rd Diag lesion is 50% stenosed.  Prox Cx lesion is 100% stenosed. Left to left collaterals.  Ramus lesion is 100% stenosed.  SVG to ramus with Origin to Prox Graft lesion is 95% stenosed.  A drug-eluting stent was successfully placed using a SYNERGY XD 3.50X48, postdilated to >4 mm. Optimized with IVUS.  Post intervention, there is a 0% residual stenosis.  There is moderate to severe left ventricular systolic dysfunction.  There is no aortic valve stenosis.  Hemodynamic findings consistent with moderate pulmonary hypertension.  Continue dual antiplatelet therapy for 6 months at least.  COnsider clopidogrel longer term if there is no interaction with his HIV meds, given the diffuse nature of CAD.   LE ART SEG MULTI (Segm & LE Reynauds)  Result Date: 12/05/2019 LOWER EXTREMITY DOPPLER STUDY High Risk Factors: Hypertension, hyperlipidemia, Diabetes, current smoker,                    coronary artery disease. Other Factors: Has been having left leg, calf and foot, edema for over 2 weeks.                 He denies claudication and no rest pain.  Performing Technologist: Wilkie Aye RVT  Examination Guidelines: A complete evaluation includes at minimum, Doppler waveform signals and systolic blood pressure reading at the level of bilateral brachial, anterior tibial, and posterior tibial arteries, when vessel segments are accessible. Bilateral testing is considered an integral part of a complete examination. Photoelectric Plethysmograph (PPG) waveforms and toe systolic pressure readings are included as required and additional duplex testing as needed. Limited examinations for reoccurring indications may be performed as noted.  ABI Findings: +---------+------------------+-----+---------+--------+ Right    Rt Pressure (mmHg)IndexWaveform Comment  +---------+------------------+-----+---------+--------+ Brachial 142                                      +---------+------------------+-----+---------+--------+ CFA                             triphasic         +---------+------------------+-----+---------+--------+ Popliteal                       triphasic         +---------+------------------+-----+---------+--------+ ATA      170               1.20 triphasic         +---------+------------------+-----+---------+--------+ PTA      164               1.15 triphasic         +---------+------------------+-----+---------+--------+ PERO     159               1.12 triphasic         +---------+------------------+-----+---------+--------+ Cleotis Nipper               0.89 Normal            +---------+------------------+-----+---------+--------+ +---------+------------------+-----+---------+------------+ Left     Lt Pressure (mmHg)IndexWaveform Comment      +---------+------------------+-----+---------+------------+  Brachial                                 AVF dialysis +---------+------------------+-----+---------+------------+ CFA                              triphasic             +---------+------------------+-----+---------+------------+ Popliteal                       triphasic             +---------+------------------+-----+---------+------------+ ATA      166               1.17 triphasic             +---------+------------------+-----+---------+------------+ PTA      168               1.18 triphasic             +---------+------------------+-----+---------+------------+ PERO     172               1.21 triphasic             +---------+------------------+-----+---------+------------+ Great Toe124               0.87 Normal                +---------+------------------+-----+---------+------------+ +-------+-----------+-----------+ ABI/TBIToday's ABIToday's TBI +-------+-----------+-----------+ Right  1.15       0.89        +-------+-----------+-----------+ Left   1.21       0.87        +-------+-----------+-----------+  Irregular heartbeat noted throughout exam.  Summary: Right: Resting right ankle-brachial index is within normal range. No evidence of significant right lower extremity arterial disease. The right toe-brachial index is normal. Left: Resting left ankle-brachial index is within normal range. No evidence of significant left lower extremity arterial disease. The left toe-brachial index is normal.  *See table(s) above for measurements and observations.  Electronically signed by Jacob Rouge MD on 12/05/2019 at 8:24:22 PM.    Final    VAS [place] LOWER EXTREMITY ARTERIAL DUPLEX  Result Date: 12/20/2019 LOWER EXTREMITY ARTERIAL DUPLEX STUDY Indications: Post op cath. High Risk Factors: Hypertension, hyperlipidemia, coronary artery disease.  Current ABI: Comparison Study: No prior studies. Performing Technologist: Oliver Hum RVT  Examination Guidelines: A complete evaluation includes B-mode imaging, spectral Doppler, color Doppler, and power Doppler as needed of all accessible portions of each vessel. Bilateral  testing is considered an integral part of a complete examination. Limited examinations for reoccurring indications may be performed as noted.  +--------+--------+-----+--------+---------+--------+ RIGHT   PSV cm/sRatioStenosisWaveform Comments +--------+--------+-----+--------+---------+--------+ CFA Mid                      triphasic         +--------+--------+-----+--------+---------+--------+ DFA                          triphasic         +--------+--------+-----+--------+---------+--------+ SFA Prox                     triphasic         +--------+--------+-----+--------+---------+--------+  Summary: Right: There is no evidence of pseudoaneurysm.  See table(s) above for measurements and observations. Electronically signed by  Servando Snare MD on 12/20/2019 at 3:24:37 PM.    Final    VAS [place] LOWER EXTREMITY VENOUS (DVT)  Result Date: 12/03/2019  Lower Venous DVTStudy Indications: Edema. Other Indications: Patient complains of bilateral leg swelling with the left                    worse than the right. He denies any shortness of breath. Risk Factors: None identified. Performing Technologist: Wilkie Aye RVT  Examination Guidelines: A complete evaluation includes B-mode imaging, spectral Doppler, color Doppler, and power Doppler as needed of all accessible portions of each vessel. Bilateral testing is considered an integral part of a complete examination. Limited examinations for reoccurring indications may be performed as noted. The reflux portion of the exam is performed with the patient in reverse Trendelenburg.  +---------+---------------+---------+-----------+----------+--------------+ RIGHT    CompressibilityPhasicitySpontaneityPropertiesThrombus Aging +---------+---------------+---------+-----------+----------+--------------+ CFV      Full           Yes      Yes                                 +---------+---------------+---------+-----------+----------+--------------+  SFJ      Full           Yes      Yes                                 +---------+---------------+---------+-----------+----------+--------------+ FV Prox  Full           Yes      Yes                                 +---------+---------------+---------+-----------+----------+--------------+ FV Mid   Full           Yes      Yes                                 +---------+---------------+---------+-----------+----------+--------------+ FV DistalFull           Yes      Yes                                 +---------+---------------+---------+-----------+----------+--------------+ PFV      Full                                                        +---------+---------------+---------+-----------+----------+--------------+ POP      Full           Yes      Yes                                 +---------+---------------+---------+-----------+----------+--------------+ PTV      Full           Yes      Yes                                 +---------+---------------+---------+-----------+----------+--------------+  PERO     Full           Yes      Yes                                 +---------+---------------+---------+-----------+----------+--------------+ Gastroc  Full                                                        +---------+---------------+---------+-----------+----------+--------------+ GSV      Full           Yes      Yes                                 +---------+---------------+---------+-----------+----------+--------------+   +---------+---------------+---------+-----------+----------+--------------+ LEFT     CompressibilityPhasicitySpontaneityPropertiesThrombus Aging +---------+---------------+---------+-----------+----------+--------------+ CFV      Full           Yes      Yes                                 +---------+---------------+---------+-----------+----------+--------------+ SFJ      Full           Yes      Yes                                  +---------+---------------+---------+-----------+----------+--------------+ FV Prox  Full           Yes      Yes                                 +---------+---------------+---------+-----------+----------+--------------+ FV Mid   Full           Yes      Yes                                 +---------+---------------+---------+-----------+----------+--------------+ FV DistalFull           Yes      Yes                                 +---------+---------------+---------+-----------+----------+--------------+ PFV      Full                                                        +---------+---------------+---------+-----------+----------+--------------+ POP      Full           Yes      Yes                                 +---------+---------------+---------+-----------+----------+--------------+ PTV      Full           Yes  Yes                                 +---------+---------------+---------+-----------+----------+--------------+ PERO     Full           Yes      Yes                                 +---------+---------------+---------+-----------+----------+--------------+ Gastroc  Full                                                        +---------+---------------+---------+-----------+----------+--------------+ GSV      Full           Yes      Yes                                 +---------+---------------+---------+-----------+----------+--------------+  Summary: BILATERAL: - No evidence of deep vein thrombosis seen in the lower extremities, bilaterally. - No evidence of superficial venous thrombosis in the lower extremities, bilaterally.  RIGHT: - No cystic structure found in the popliteal fossa.  LEFT: - No cystic structure found in the popliteal fossa.  *See table(s) above for measurements and observations. Electronically signed by Jacob Rouge MD on 12/03/2019 at 2:01:34 PM.    Final    Disposition   Pt is being  discharged home today in good condition.  Follow-up Plans & Appointments   Follow-up Information    Tommie Raymond, NP Follow up on 01/01/2020.   Specialty: Cardiology Why: at 11:15am for your follow up appt. Contact information: [address] [place] [address] [phone]        Gerrit Heck V, DO Follow up.   Specialty: Gastroenterology Why: You will need outpatient follow up within the next 2 weeks.  Contact information: [place] [address] [phone]          Discharge Instructions    AMB Referral to Cardiac Rehabilitation - Phase II   Complete by: As directed    Diagnosis: Coronary Stents   After initial evaluation and assessments completed: Virtual Based Care may be provided alone or in conjunction with Phase 2 Cardiac Rehab based on patient barriers.: Yes   Amb Referral to Cardiac Rehabilitation   Complete by: As directed    Diagnosis: Coronary Stents   After initial evaluation and assessments completed: Virtual Based Care may be provided alone or in conjunction with Phase 2 Cardiac Rehab based on patient barriers.: Yes   Call MD for:  redness, tenderness, or signs of infection (pain, swelling, redness, odor or green/yellow discharge around incision site)   Complete by: As directed    Diet - low sodium heart healthy   Complete by: As directed    Discharge instructions   Complete by: As directed    Groin Site Care Refer to this sheet in the next few weeks. These instructions provide you with information on caring for yourself after your procedure. Your caregiver may also give you more specific instructions. Your treatment has been planned according to current medical practices, but problems sometimes occur. Call your caregiver if  you have any problems or questions after your procedure. HOME CARE INSTRUCTIONS You may shower 24 hours after the procedure. Remove the bandage (dressing) and gently wash the site with  plain soap and water. Gently pat the site dry.  Do not apply powder or lotion to the site.  Do not sit in a bathtub, swimming pool, or whirlpool for 5 to 7 days.  No bending, squatting, or lifting anything over 10 pounds (4.5 kg) as directed by your caregiver.  Inspect the site at least twice daily.  Do not drive home if you are discharged the same day of the procedure. Have someone else drive you.  You may drive 24 hours after the procedure unless otherwise instructed by your caregiver.  What to expect: Any bruising will usually fade within 1 to 2 weeks.  Blood that collects in the tissue (hematoma) may be painful to the touch. It should usually decrease in size and tenderness within 1 to 2 weeks.  SEEK IMMEDIATE MEDICAL CARE IF: You have unusual pain at the groin site or down the affected leg.  You have redness, warmth, swelling, or pain at the groin site.  You have drainage (other than a small amount of blood on the dressing).  You have chills.  You have a fever or persistent symptoms for more than 72 hours.  You have a fever and your symptoms suddenly get worse.  Your leg becomes pale, cool, tingly, or numb.  You have heavy bleeding from the site. Hold pressure on the site.   PLEASE DO NOT MISS ANY DOSES OF YOUR BRILINTA!!!!! Also keep a log of you blood pressures and bring back to your follow up appt. Please call the office with any questions.   Patients taking blood thinners should generally stay away from medicines like ibuprofen, Advil, Motrin, naproxen, and Aleve due to risk of stomach bleeding. You may take Tylenol as directed or talk to your primary doctor about alternatives.   Increase activity slowly   Complete by: As directed      Discharge Medications     Medication List    STOP taking these medications   aspirin EC 81 MG tablet     TAKE these medications   acetaminophen 500 MG tablet Commonly known as: TYLENOL Take 1,000 mg by mouth every 6 (six) hours as  needed for moderate pain.   allopurinol 100 MG tablet Commonly known as: ZYLOPRIM TAKE 1 TABLET(100 MG) BY MOUTH DAILY   Anoro Ellipta 62.5-25 MCG/INH Aepb Generic drug: umeclidinium-vilanterol INHALE 1 PUFF BY MOUTH EVERY DAY   atorvastatin 80 MG tablet Commonly known as: LIPITOR Take 1 tablet (80 mg total) by mouth daily.   B-D UF III MINI PEN NEEDLES 31G X 5 MM Misc Generic drug: Insulin Pen Needle USE FOUR TIMES DAILY   Biktarvy 50-200-25 MG Tabs tablet Generic drug: bictegravir-emtricitabine-tenofovir AF TAKE 1 TABLET BY MOUTH DAILY   colchicine 0.6 MG tablet Take 0.6 mg by mouth daily as needed (pain).   diclofenac Sodium 1 % Gel Commonly known as: VOLTAREN Apply 2 g topically 3 (three) times daily as needed (pain).   docusate sodium 100 MG capsule Commonly known as: COLACE Take 100 mg by mouth daily as needed for mild constipation or moderate constipation.   Fenofibric Acid 135 MG Cpdr TAKE 1 CAPSULE BY MOUTH DAILY   gabapentin 300 MG capsule Commonly known as: NEURONTIN Take 1 capsule (300 mg total) by mouth daily.   glucose blood test strip Use to test  blood sugar three times daily. E11.40   insulin lispro 100 UNIT/ML injection Commonly known as: HUMALOG Inject 20 Units into the skin 3 (three) times daily as needed for high blood sugar (above 150).   isosorbide mononitrate 30 MG 24 hr tablet Commonly known as: IMDUR TAKE 1 TABLET(30 MG) BY MOUTH DAILY   latanoprost 0.005 % ophthalmic solution Commonly known as: XALATAN Place 1 drop into both eyes at bedtime.   Melatonin 3 MG Tabs Take 1 tablet (3 mg total) by mouth at bedtime.   multivitamin Tabs tablet Take 1 tablet by mouth daily.   nitroGLYCERIN 0.3 MG SL tablet Commonly known as: NITROSTAT ONE TABLET UNDER TONGUE AS NEEDED FOR CHEST PAIN   OneTouch Delica Plus [id number] Misc Inject 1 Device as directed 3 (three) times daily. Dx: E11.40   pantoprazole 40 MG tablet Commonly known as:  PROTONIX Take 1 tablet (40 mg total) by mouth 2 (two) times daily. What changed: See the new instructions.   polyethylene glycol 17 g packet Commonly known as: MIRALAX / GLYCOLAX Take 17 g by mouth daily as needed for moderate constipation.   sevelamer carbonate 800 MG tablet Commonly known as: RENVELA Take 800 mg by mouth 3 (three) times daily with meals.   simethicone 80 MG chewable tablet Commonly known as: MYLICON Chew 1 tablet (80 mg total) by mouth 4 (four) times daily as needed for flatulence.   sucralfate 1 GM/10ML suspension Commonly known as: CARAFATE Take 10 mLs (1 g total) by mouth 2 (two) times daily.   ticagrelor 90 MG Tabs tablet Commonly known as: BRILINTA Take 1 tablet (90 mg total) by mouth 2 (two) times daily.   timolol 0.5 % ophthalmic solution Commonly known as: TIMOPTIC Place 1 drop into both eyes every morning.        No                               Did the patient have a percutaneous coronary intervention (stent / angioplasty)?:  Yes.     Cath/PCI Registry Performance & Quality Measures: 1. Aspirin prescribed? - No - developed GI bleed, therefore ASA stopped. 2. ADP Receptor Inhibitor (Plavix/Clopidogrel, Brilinta/Ticagrelor or Effient/Prasugrel) prescribed (includes medically managed patients)? - Yes 3. High Intensity Statin (Lipitor 40-80mg  or Crestor 20-40mg ) prescribed? - Yes 4. For EF <40%, was ACEI/ARB prescribed? - Not Applicable (EF >/= 33%) 5. For EF <40%, Aldosterone Antagonist (Spironolactone or Eplerenone) prescribed? - Not Applicable (EF >/= 00%) 6. Cardiac Rehab Phase II ordered (Included Medically managed Patients)? - Yes  Outstanding Labs/Studies   CBC at follow up  Duration of Discharge Encounter   Greater than 30 minutes including physician time.  Signed, Reino Bellis NP-C 12/23/2019, 10:59 AM   Patient seen, examined. Available data reviewed. Agree with findings, assessment, and plan as outlined by Reino Bellis,  NP.  The patient is independently interviewed and examined in the hemodialysis unit this morning.  He is awake, alert and oriented, in no distress.  JVP is normal, lung fields are clear, heart is regular rate and rhythm with a 2/6 systolic ejection murmur at the right upper sternal border, abdomen is soft and nontender, extremities show no edema.  The patient has had an acute upper GI bleed following saphenous vein graft PCI for treatment of unstable angina.  Appreciate GI evaluation and endoscopic findings noted with gastric AVMs, 2 of which were actively bleeding.  The patient's hemoglobin has stabilized.  I have reviewed his medical program and I think it would be reasonable to treat him with ticagrelor alone considering balance of ischemic and bleeding risk.  The patient is very eager for hospital discharge and he appears stable for discharge today.  Stressed the importance of adherence to antiplatelet therapy with ticagrelor.  Will discontinue aspirin.  Will arrange close outpatient follow-up with Jacob [name].  Sherren Mocha, M.D. 12/23/2019 10:59 AM

## 2019-12-20 NOTE — Progress Notes (Signed)
   12/20/19 2022  Clinical Encounter Type  Visited With Patient  Visit Type Initial  Referral From Nurse  Consult/Referral To Chaplain  Spiritual Encounters  Spiritual Needs Prayer;Emotional;Sacred text  Stress Factors  Patient Stress Factors Health changes   Chaplain responded to consult for prayer. Jacob [name] was needing to talk with Chaplain. Jacob [name] is "feeling depressed because no one is listening to me." Jacob [name] expressed lack of trust in his medical team. He also expressed lack of satisfaction with his hospital room due to the "hose set up in the window" and the draft it causes. Jacob [name] said he was told that GI will see him on Saturday, but he feels it is doubtful due to Saturday being part of the weekend. Chaplain offered ministry of prayer and presence. Chaplain would suggest a heating pad or blanket if Jacob [name] would be a safe patient to receive that type of therapeutic device and medical team is able to provide it. Chaplain suggested to Jacob [name] that his son may be able to bring him some warm slippers or a warm blanket from home, but Jacob [name] said his son works until around 6p on Saturday. Chaplains remains available for support as needs arise.   Chaplain Resident, Evelene Croon, M Div [phone] on-call pager

## 2019-12-20 NOTE — Progress Notes (Signed)
Right lower extremity pseudoaneurysm duplex has been completed. Preliminary results can be found in CV Proc through chart review.   12/20/19 12:29 PM Jacob [name] RVT

## 2019-12-20 NOTE — Consult Note (Signed)
[facility] Renal Consultation Note    Indication for Consultation:  Management of ESRD/hemodialysis; anemia, hypertension/volume and secondary hyperparathyroidism PCP: Marlowe Sax NP  HPI: Jacob [name] is a 69 y.o. male with ESRD on hemodialysis MWF at [facility] PMH: HTN, DM, Polysubstance abuse, CAD S/P CABG 2004, HFrEF,  GIB, COPD, Hepatitis B,HIV, AOCD, SHPT. Patient was admitted to [facility] 12/19/2019 for cardiac cath after after experiencing worsening  DOE. He had PCI/DES to SVG to Ramus 12/19/2019. Apparently he developed PSVT and intermittent chest pain last evening. Was seen by Dr. Irish Lack who believed symptoms were related to volume overload, not stent thrombosis. He was to be discharged to OP HD unit this AM for hemodialysis but HGB fell from 9.3 12/19/19 to 7.0 12/20/2019. He has no symptoms of retroperitoneal bleed, no CVAT RLE pseudoaneurysm duplex has been completed, no evidence of pseudoaneurysm.   He was seen by GI 11/07/2019 for rectal pain and bleeding. H/O nonbleeding gastric and intestinal AVMs, need colonoscopy but this has not been scheduled D/T cardiac issues. Last OP HGB 8.5 12/18/2019. He requires high dose ESA and iron loads to maintain HGB above 8.0.   Currently he denies SOB/chest pain. Doesn't want to have HD at [facility] but agrees as he needs transfusion. He will have HD here today and be discharged post HD per cardiology.   Past Medical History:  Diagnosis Date  . Acute respiratory failure ([place]) 03/01/2018  . Arthritis    "all over; mostly knees and back" (02/28/2018)  . CHF (congestive heart failure) (HCC)    not on any meds  . Chronic lower back pain    stenosis  . Community acquired pneumonia 09/06/2013  . COPD (chronic obstructive pulmonary disease) ([place])   . Coronary atherosclerosis of native coronary artery 2005   s/p surgery  . Drug abuse ([place])    hx; tested for cocaine as recently as 2/08. says he is not using drugs now -  avoided defib. for this reason   . ESRD (end stage renal disease) ([place])    Hemo M-W-F- Richarda Blade  . GERD (gastroesophageal reflux disease)    takes OTC meds as needed  . GI bleeding 02/06/2019  . Glaucoma    uses eye drops daily  . Hepatitis B 1968   "tx'd w/isolation; caught it from toilet stools in gym"  . History of blood transfusion 03/01/2019  . History of colon polyps    benign  . History of gout    takes Allopurinol daily as well as Colchicine-if needed (02/28/2018)  . History of kidney stones   . HTN (hypertension)    takes Coreg,Imdur.and Apresoline daily  . Human immunodeficiency virus (HIV) disease ([place]) dx'd 1995   takes Genvoya daily  . Hyperlipidemia    takes Atorvastatin daily  . Ischemic cardiomyopathy   . Muscle spasm    takes Zanaflex as needed  . Myocardial infarction ([place]) ~ 2004/2005  . Nocturia   . Peripheral neuropathy    takes gabapentin daily  . Pneumonia    "at least twice" (02/28/2018)  . Shortness of breath dyspnea    rarely but if notices it then with exertion  . Syphilis, unspecified   . Type II diabetes mellitus ([place]) 2004   Lantus daily.Average fasting blood sugar 125-199  . Wears glasses   . Wears partial dentures    Past Surgical History:  Procedure Laterality Date  . AV FISTULA PLACEMENT Left 08/02/2018   Procedure: ARTERIOVENOUS (AV) FISTULA CREATION  left  arm radiocephlic;  Surgeon: Marty Heck, MD;  Location: [facility] OR;  Service: Vascular;  Laterality: Left;  . AV FISTULA PLACEMENT Left 08/01/2019   Procedure: LEFT BRACHIOCEPHALIC ARTERIOVENOUS (AV) FISTULA CREATION;  Surgeon: [name], MD;  Location: [place];  Service: Vascular;  Laterality: Left;  . BASCILIC VEIN TRANSPOSITION Left 10/03/2019   Procedure: BASILIC VEIN TRANSPOSITION LEFT SECOND STAGE;  Surgeon: [name], MD;  Location: [place];  Service: Vascular;  Laterality: Left;  . CARDIAC CATHETERIZATION  10/2002; 12/19/2004   [name] 03/08/2011  . COLONOSCOPY  2013    Dr.John Henrene Pastor   . CORONARY ARTERY BYPASS GRAFT  02/24/2003   CABG X2/notes 03/08/2011  . CORONARY STENT INTERVENTION N/A 12/19/2019   Procedure: CORONARY STENT INTERVENTION;  Surgeon: Jettie Booze, MD;  Location: [place] CV LAB;  Service: Cardiovascular;  Laterality: N/A;  . ESOPHAGOGASTRODUODENOSCOPY (EGD) WITH PROPOFOL N/A 02/08/2019   Procedure: ESOPHAGOGASTRODUODENOSCOPY (EGD) WITH PROPOFOL;  Surgeon: Milus Banister, MD;  Location: [place];  Service: Gastroenterology;  Laterality: N/A;  . HOT HEMOSTASIS N/A 02/08/2019   Procedure: HOT HEMOSTASIS (ARGON PLASMA COAGULATION/BICAP);  Surgeon: Milus Banister, MD;  Location: [facility] ENDOSCOPY;  Service: Gastroenterology;  Laterality: N/A;  . INTERTROCHANTERIC HIP FRACTURE SURGERY Left 11/2006   [name] 03/08/2011  . INTRAVASCULAR ULTRASOUND/IVUS N/A 12/19/2019   Procedure: Intravascular Ultrasound/IVUS;  Surgeon: Jettie Booze, MD;  Location: [place] CV LAB;  Service: Cardiovascular;  Laterality: N/A;  . IR FLUORO GUIDE CV LINE RIGHT  07/24/2019  . IR FLUORO GUIDE CV LINE RIGHT  07/30/2019  . IR US GUIDE VASC ACCESS RIGHT  07/24/2019  . IR US GUIDE VASC ACCESS RIGHT  07/30/2019  . LAPAROSCOPIC CHOLECYSTECTOMY  05/2006  . LIGATION OF COMPETING BRANCHES OF ARTERIOVENOUS FISTULA Left 11/05/2018   Procedure: LIGATION OF COMPETING BRANCHES OF ARTERIOVENOUS FISTULA  LEFT  ARM;  Surgeon: Marty Heck, MD;  Location: [place];  Service: Vascular;  Laterality: Left;  . LUMBAR LAMINECTOMY/DECOMPRESSION MICRODISCECTOMY N/A 02/29/2016   Procedure: Left L4-5 Lateral Recess Decompression, Removal Extradural Intraspinal Facet Cyst;  Surgeon: Marybelle Killings, MD;  Location: [place];  Service: Orthopedics;  Laterality: N/A;  . MULTIPLE TOOTH EXTRACTIONS    . ORIF MANDIBULAR FRACTURE Left 08/13/2004   ORIF of left body fracture mandible with KLS Martin 2.3-mm six hole/notes 03/08/2011  . RIGHT/LEFT HEART CATH AND CORONARY/GRAFT ANGIOGRAPHY N/A  12/19/2019   Procedure: RIGHT/LEFT HEART CATH AND CORONARY/GRAFT ANGIOGRAPHY;  Surgeon: Jettie Booze, MD;  Location: [place] CV LAB;  Service: Cardiovascular;  Laterality: N/A;   Family History  Problem Relation Age of Onset  . Heart failure Father   . Hypertension Father   . Diabetes Brother   . Heart attack Brother   . Alzheimer's disease Mother   . Stroke Sister   . Diabetes Sister   . Alzheimer's disease Sister   . Hypertension Brother   . Diabetes Brother   . Drug abuse Brother   . Colon cancer Neg Hx    Social History:  reports that he has been smoking cigarettes. He has a 21.50 pack-year smoking history. He has never used smokeless tobacco. He reports previous alcohol use of about 12.0 standard drinks of alcohol per week. He reports that he does not use drugs. Allergies  Allergen Reactions  . Augmentin [Amoxicillin-Pot Clavulanate] Diarrhea    Severe  . Amphetamines Other (See Comments)    Unknown reaction type   Prior to Admission medications   Medication Sig  Start Date End Date Taking? Authorizing Provider  acetaminophen (TYLENOL) 500 MG tablet Take 1,000 mg by mouth every 6 (six) hours as needed for moderate pain.    Yes [provider]  allopurinol (ZYLOPRIM) 100 MG tablet TAKE 1 TABLET(100 MG) BY MOUTH DAILY 07/15/19  Yes Eubanks, [name], NP  ANORO ELLIPTA 62.5-25 MCG/INH AEPB INHALE 1 PUFF BY MOUTH EVERY DAY 12/11/19  Yes [name], Dinah C, NP  aspirin EC 81 MG tablet Take 81 mg by mouth daily.   Yes [provider]  atorvastatin (LIPITOR) 80 MG tablet Take 1 tablet (80 mg total) by mouth daily. 12/05/18  Yes [name], NP  BIKTARVY 50-200-25 MG TABS tablet TAKE 1 TABLET BY MOUTH DAILY 07/12/19  Yes [name], MD  Choline Fenofibrate (FENOFIBRIC ACID) 135 MG CPDR TAKE 1 CAPSULE BY MOUTH DAILY 09/23/19  Yes [name], NP  diclofenac Sodium (VOLTAREN) 1 % GEL Apply 2 g topically 3 (three) times daily as needed (pain).    Yes [provider]  docusate sodium (COLACE) 100 MG capsule Take 100 mg by mouth daily as needed for mild constipation or moderate constipation.   Yes [provider]  gabapentin (NEURONTIN) 300 MG capsule Take 1 capsule (300 mg total) by mouth daily. 10/29/19  Yes [name], NP  insulin lispro (HUMALOG) 100 UNIT/ML injection Inject 20 Units into the skin 3 (three) times daily as needed for high blood sugar (above 150).    Yes [provider]  isosorbide mononitrate (IMDUR) 30 MG 24 hr tablet TAKE 1 TABLET(30 MG) BY MOUTH DAILY 08/09/19  Yes Eubanks, [name], NP  latanoprost (XALATAN) 0.005 % ophthalmic solution Place 1 drop into both eyes at bedtime.   Yes [provider]  Melatonin 3 MG TABS Take 1 tablet (3 mg total) by mouth at bedtime. 11/19/19  Yes [name], Dinah C, NP  multivitamin (RENA-VIT) TABS tablet Take 1 tablet by mouth daily.   Yes [provider]  nitroGLYCERIN (NITROSTAT) 0.3 MG SL tablet ONE TABLET UNDER TONGUE AS NEEDED FOR CHEST PAIN 05/01/19  Yes [name], MD  pantoprazole (PROTONIX) 40 MG tablet TAKE 1 TABLET(40 MG) BY MOUTH DAILY 12/11/19  Yes [name], Dinah C, NP  polyethylene glycol (MIRALAX / GLYCOLAX) 17 g packet Take 17 g by mouth daily as needed for moderate constipation.   Yes [provider]  sevelamer carbonate (RENVELA) 800 MG tablet Take 800 mg by mouth 3 (three) times daily with meals.   Yes [provider]  simethicone (MYLICON) 80 MG chewable tablet Chew 1 tablet (80 mg total) by mouth 4 (four) times daily as needed for flatulence. 08/16/19  Yes [name], NP  timolol (TIMOPTIC) 0.5 % ophthalmic solution Place 1 drop into both eyes every morning. 04/04/19  Yes [provider]  B-D UF III MINI PEN NEEDLES 31G X 5 MM MISC USE FOUR TIMES DAILY 02/25/19   [name], NP  colchicine 0.6 MG tablet Take 0.6 mg by mouth daily as needed (pain).    [provider]   glucose blood test strip Use to test blood sugar three times daily. E11.40 04/15/19   [name], NP  Lancets [facility] DELICA PLUS [id number]) MISC Inject 1 Device as directed 3 (three) times daily. Dx: E11.40 09/18/19   [name], NP   Current Facility-Administered Medications  Medication Dose Route Frequency Provider Last Rate Last Admin  . 0.9 %  sodium chloride infusion (Manually  program via Guardrails IV Fluids)   Intravenous Once Valentina Gu, NP      . 0.9 %  sodium chloride infusion  250 mL Intravenous PRN Jettie Booze, MD      . acetaminophen (TYLENOL) tablet 650 mg  650 mg Oral Q4H PRN Jettie Booze, MD   650 mg at 12/20/19 0526  . allopurinol (ZYLOPRIM) tablet 100 mg  100 mg Oral Daily Jettie Booze, MD   100 mg at 12/20/19 1002  . aspirin chewable tablet 81 mg  81 mg Oral Daily Jettie Booze, MD   81 mg at 12/20/19 1002  . atorvastatin (LIPITOR) tablet 80 mg  80 mg Oral q1800 Jettie Booze, MD   80 mg at 12/19/19 1633  . bictegravir-emtricitabine-tenofovir AF (BIKTARVY) 50-200-25 MG per tablet 1 tablet  1 tablet Oral Daily Jettie Booze, MD   1 tablet at 12/20/19 1002  . calcitRIOL (ROCALTROL) capsule 1 mcg  1 mcg Oral Q M,W,F-HD Valentina Gu, NP      . Chlorhexidine Gluconate Cloth 2 % PADS 6 each  6 each Topical Q0600 Valentina Gu, NP      . colchicine tablet 0.6 mg  0.6 mg Oral Daily PRN Jettie Booze, MD      . docusate sodium (COLACE) capsule 100 mg  100 mg Oral Daily PRN Jettie Booze, MD      . fenofibrate tablet 160 mg  160 mg Oral Daily Jettie Booze, MD   160 mg at 12/20/19 1003  . gabapentin (NEURONTIN) capsule 300 mg  300 mg Oral Daily Jettie Booze, MD   300 mg at 12/20/19 1003  . insulin aspart (novoLOG) injection 0-15 Units  0-15 Units Subcutaneous TID WC Kroeger, Krista M., PA-C   3 Units at 12/20/19 1003  . insulin aspart (novoLOG) injection 0-5 Units  0-5  Units Subcutaneous QHS Kroeger, Krista M., PA-C      . isosorbide mononitrate (IMDUR) 24 hr tablet 30 mg  30 mg Oral Daily Jettie Booze, MD   30 mg at 12/20/19 1002  . latanoprost (XALATAN) 0.005 % ophthalmic solution 1 drop  1 drop Both Eyes QHS Jettie Booze, MD   1 drop at 12/19/19 2228  . Melatonin TABS 3 mg  3 mg Oral QHS Jettie Booze, MD   3 mg at 12/19/19 2153  . multivitamin (RENA-VIT) tablet 1 tablet  1 tablet Oral QHS Jettie Booze, MD   1 tablet at 12/19/19 2154  . nitroGLYCERIN (NITROSTAT) SL tablet 0.4 mg  0.4 mg Sublingual Q5 min PRN Jettie Booze, MD   0.4 mg at 12/19/19 1548  . ondansetron (ZOFRAN) injection 4 mg  4 mg Intravenous Q6H PRN Jettie Booze, MD   4 mg at 12/19/19 1301  . pantoprazole (PROTONIX) EC tablet 40 mg  40 mg Oral Daily Jettie Booze, MD   40 mg at 12/20/19 1002  . polyethylene glycol (MIRALAX / GLYCOLAX) packet 17 g  17 g Oral Daily PRN Jettie Booze, MD      . sevelamer carbonate (RENVELA) tablet 800 mg  800 mg Oral TID WC Jettie Booze, MD   800 mg at 12/20/19 1003  . simethicone (MYLICON) chewable tablet 80 mg  80 mg Oral QID PRN Jettie Booze, MD      . sodium chloride flush (NS) 0.9 % injection 3 mL  3 mL Intravenous Q12H Jettie Booze, MD  3 mL at 12/19/19 2155  . sodium chloride flush (NS) 0.9 % injection 3 mL  3 mL Intravenous PRN Jettie Booze, MD      . ticagrelor [facility]) tablet 90 mg  90 mg Oral BID Jettie Booze, MD   90 mg at 12/20/19 1002  . timolol (TIMOPTIC) 0.5 % ophthalmic solution 1 drop  1 drop Both Eyes Daily Jettie Booze, MD      . umeclidinium-vilanterol [facility] ELLIPTA) 62.5-25 MCG/INH 1 puff  1 puff Inhalation Daily Jettie Booze, MD   1 puff at 12/20/19 3875   Labs: Basic Metabolic Panel: Recent Labs  Lab 12/17/19 1042 12/17/19 1042 12/19/19 0547 12/19/19 0547 12/19/19 0814 12/19/19 0817 12/20/19 0311  NA 139  --  136    < > 138 137 139  K 3.0*   < > 5.1   < > 3.1* 3.0* 3.6  CL 97  --  94*  --   --   --  99  CO2 30*  --  29  --   --   --  28  GLUCOSE 57*  --  133*  --   --   --  133*  BUN 41*  --  34*  --   --   --  76*  CREATININE 4.95*  --  5.10*  --   --   --  5.94*  CALCIUM 8.8  --  8.8*  --   --   --  8.4*   < > = values in this interval not displayed.   Liver Function Tests: No results for input(s): AST, ALT, ALKPHOS, BILITOT, PROT, ALBUMIN in the last 168 hours. No results for input(s): LIPASE, AMYLASE in the last 168 hours. No results for input(s): AMMONIA in the last 168 hours. CBC: Recent Labs  Lab 12/17/19 1042 12/19/19 0547 12/19/19 0814 12/19/19 0817 12/20/19 0311 12/20/19 0628  WBC 4.5 3.5*  --   --  4.7 4.8  NEUTROABS 2.8  --   --   --   --   --   HGB 8.4* 9.3*   < > 9.5* 7.0* 7.3*  HCT 26.0* 29.7*   < > 28.0* 22.5* 23.1*  MCV 81 86.8  --   --  87.9 86.5  PLT 145* 149*  --   --  142* 147*   < > = values in this interval not displayed.   Cardiac Enzymes: No results for input(s): CKTOTAL, CKMB, CKMBINDEX, TROPONINI in the last 168 hours. CBG: Recent Labs  Lab 12/19/19 0954 12/19/19 1636 12/19/19 2101 12/20/19 0746 12/20/19 1058  GLUCAP 88 141* 140* 180* 152*   Iron Studies: No results for input(s): IRON, TIBC, TRANSFERRIN, FERRITIN in the last 72 hours. Studies/Results: CARDIAC CATHETERIZATION  Addendum Date: 12/19/2019    Prox RCA lesion is 100% stenosed. SVG to RCA is widely patent.  Dist LAD lesion is 90% stenosed. Too small and distal for PCI.  3rd Diag lesion is 50% stenosed.  Prox Cx lesion is 100% stenosed. Left to left collaterals.  Ramus lesion is 100% stenosed.  SVG to ramus with Origin to Prox Graft lesion is 95% stenosed.  A drug-eluting stent was successfully placed using a SYNERGY XD 3.50X48, postdilated to >4 mm. Optimized with IVUS.  Post intervention, there is a 0% residual stenosis.  There is moderate to severe left ventricular systolic  dysfunction.  There is no aortic valve stenosis.  Hemodynamic findings consistent with moderate pulmonary hypertension.  Continue dual antiplatelet therapy  for 6 months at least.  COnsider clopidogrel longer term if there is no interaction with his HIV meds, given the diffuse nature of CAD.   Result Date: 12/19/2019  Prox RCA lesion is 100% stenosed. SVG to RCA is widely patent.  Dist LAD lesion is 90% stenosed. Too small and distal for PCI.  3rd Diag lesion is 50% stenosed.  Prox Cx lesion is 100% stenosed. Left to left collaterals.  Ramus lesion is 100% stenosed.  SVG to ramus with Origin to Prox Graft lesion is 95% stenosed.  A drug-eluting stent was successfully placed using a SYNERGY XD 3.50X48, postdilated to >4 mm. Optimized with IVUS.  Post intervention, there is a 0% residual stenosis.  There is moderate to severe left ventricular systolic dysfunction.  There is no aortic valve stenosis.  Hemodynamic findings consistent with moderate pulmonary hypertension.  Continue dual antiplatelet therapy for 6 months at least.  COnsider clopidogrel longer term if there is no interaction with his HIV meds, given the diffuse nature of CAD.   VAS [place] LOWER EXTREMITY ARTERIAL DUPLEX  Result Date: 12/20/2019 LOWER EXTREMITY ARTERIAL DUPLEX STUDY Indications: Post op cath. High Risk Factors: Hypertension, hyperlipidemia, coronary artery disease.  Current ABI: Comparison Study: No prior studies. Performing Technologist: Oliver Hum RVT  Examination Guidelines: A complete evaluation includes B-mode imaging, spectral Doppler, color Doppler, and power Doppler as needed of all accessible portions of each vessel. Bilateral testing is considered an integral part of a complete examination. Limited examinations for reoccurring indications may be performed as noted.  +--------+--------+-----+--------+---------+--------+ RIGHT   PSV cm/sRatioStenosisWaveform Comments  +--------+--------+-----+--------+---------+--------+ CFA Mid                      triphasic         +--------+--------+-----+--------+---------+--------+ DFA                          triphasic         +--------+--------+-----+--------+---------+--------+ SFA Prox                     triphasic         +--------+--------+-----+--------+---------+--------+  Summary: Right: There is no evidence of pseudoaneurysm.  See table(s) above for measurements and observations.    Preliminary     ROS: As per HPI otherwise negative.   Physical Exam: Vitals:   12/19/19 1555 12/19/19 1800 12/19/19 2102 12/20/19 0356  BP: 134/66 (!) 143/79 138/62 115/64  Pulse: (!) 56 80 90 81  Resp: 16 (!) 21 19 17   Temp:   98.4 F (36.9 C) 98 F (36.7 C)  TempSrc:   Oral Oral  SpO2: 97% 96% 98% 95%  Weight:    82.1 kg  Height:         General: Chronically in no acute distress. Head: Normocephalic, atraumatic, sclera non-icteric, mucus membranes are moist Neck: Supple. JVD not elevated. Lungs: Bilateral breath sound with bibasilar crackles. No wheezing.  Breathing is unlabored. Heart: RRR with S1 S2. No murmurs, rubs, or gallops appreciated. Abdomen: Soft, non-tender, non-distended with normoactive bowel sounds. No rebound/guarding. No obvious abdominal masses. M-S:  Strength and tone appear normal for age. Lower extremities:without edema or ischemic changes, no open wounds  Neuro: Alert and oriented X 3. Moves all extremities spontaneously. Psych:  Responds to questions appropriately with a normal affect. Dialysis Access: L AVF +T/B  Dialysis Orders: [facility] MWF 4 hrs 180NRe 400/800 83 kg 2.0 K/ 2.0 Ca  L AVF -No Heparin -Venofer 100 mg IV X 10 (2/10 doses given 12/18/2019) -Calcitriol 1.0 mcg PO TIW -Mircera 200 mcg IV q 2 weeks (last 12/18/2019)  Assessment/Plan: 1.  Chest Pain/DOE: To cath lab, status post DES to SVG Ramus. DC on DAPT for 6 months per primary 2.  Symptomatic Anemia: No  acute blood loss reported, no reports of bloody stool. Has been seen by GI 11/2019 as OP for rectal bleeding. Needs colonoscopy but not scheduled D/T cardiac issues. Recent OP ESA dose 02/24 has started Fe load at OP clinic. Give 1 unit PRBCs. Check stat CBC 2 hours post HD. Follow HGB. K+ 3.6 4.0 K bath  3.  ESRD -  MWF. HD today  On schedule No heparin.  4.  Hypertension/volume  - BP controlled with trace LE edema, bibasilar crackles. EDW recently lowered at OP clinic. Attempt 1-1.5 liters in HD today. 5.  Metabolic bone disease - Ca 8.4 cont binders, VDRA 6.  Nutrition -renal/Carb mod diet.  7.  DM-per primary 8.  HIV-continue meds per primary 9.  Hepatitis B   H. Owens Shark, NP-C 12/20/2019, 1:11 PM  [employer] [phone]

## 2019-12-20 NOTE — Progress Notes (Addendum)
CARDIAC REHAB PHASE I   PRE:  Rate/Rhythm: 83 SR  BP:  Supine:   Sitting: 136/62  Standing:    SaO2: 98 RA  MODE:  Ambulation: 470 ft   POST:  Rate/Rhythm: 113 ST  BP:  Supine:   Sitting: 153/66  Standing:    SaO2: 99 RA [id number] On arrival pt in bed, frustrated and asking to go home. Dr. Ellyn Hack arrived while I was there. Dr. Ellyn Hack explained to pt that his blood count had dropped and that he was going to stay in the hospital to get blood and his dialysis. Pt upset but after explanation, he was agreeable. Dr, Ellyn Hack advised that he wanted pt to ambulate. Assisted X 1 to walk in hall. Pt slightly unsteady at first but improved with distance. He walk 470 feet. He did not c/o of any pain walking or after.  He is DOE, with room air saturation after walk 99%. I questioned pt and he states that he has been so SOB at home that he has not been able to do much of anything. Walking to the mailbox has been very difficult and required stops. He states that his SOB is much improved with walking this morning. Pt to recliner after walk with call light in reach. Completed stent discharge education. We discussed stent, ASA, Brilintia, exercise, diet and Outpt. CRP. Pt voices understanding. Will send referral to Outtpt. CRP in [place], although it would be very difficult for him to attend due to his dialysis also being on the same days as Outpt CRP. Hopefully we can work out something with this schedule.    Rodney Langton RN 12/20/2019 9:33 AM

## 2019-12-20 NOTE — Progress Notes (Addendum)
Progress Note  Patient Name: Jacob [name] Date of Encounter: 12/20/2019  Primary Cardiologist: No primary care provider on file.   Subjective   No chest pain or shortness of breath this morning. Wants to go home.   Inpatient Medications    Scheduled Meds: . allopurinol  100 mg Oral Daily  . aspirin  81 mg Oral Daily  . atorvastatin  80 mg Oral q1800  . bictegravir-emtricitabine-tenofovir AF  1 tablet Oral Daily  . fenofibrate  160 mg Oral Daily  . gabapentin  300 mg Oral Daily  . insulin aspart  0-15 Units Subcutaneous TID WC  . insulin aspart  0-5 Units Subcutaneous QHS  . isosorbide mononitrate  30 mg Oral Daily  . latanoprost  1 drop Both Eyes QHS  . Melatonin  3 mg Oral QHS  . multivitamin  1 tablet Oral QHS  . pantoprazole  40 mg Oral Daily  . sevelamer carbonate  800 mg Oral TID WC  . sodium chloride flush  3 mL Intravenous Q12H  . ticagrelor  90 mg Oral BID  . timolol  1 drop Both Eyes Daily  . umeclidinium-vilanterol  1 puff Inhalation Daily   Continuous Infusions: . sodium chloride     PRN Meds: sodium chloride, acetaminophen, colchicine, docusate sodium, nitroGLYCERIN, ondansetron (ZOFRAN) IV, polyethylene glycol, simethicone, sodium chloride flush   Vital Signs    Vitals:   12/19/19 1555 12/19/19 1800 12/19/19 2102 12/20/19 0356  BP: 134/66 (!) 143/79 138/62 115/64  Pulse: (!) 56 80 90 81  Resp: 16 (!) 21 19 17   Temp:   98.4 F (36.9 C) 98 F (36.7 C)  TempSrc:   Oral Oral  SpO2: 97% 96% 98% 95%  Weight:    82.1 kg  Height:        Intake/Output Summary (Last 24 hours) at 12/20/2019 0802 Last data filed at 12/19/2019 1500 Gross per 24 hour  Intake 86.67 ml  Output 125 ml  Net -38.33 ml   Last 3 Weights 12/20/2019 12/19/2019 12/12/2019  Weight (lbs) 181 lb 185 lb 190 lb  Weight (kg) 82.101 kg 83.915 kg 86.183 kg      Telemetry    SR with short episode of NSVT - Personally Reviewed  ECG    SR with RBBB, atrial enlargement and TWI in  anterolateral leads - Personally Reviewed  Physical Exam  Older AAM, sitting up in bed eating breakfast GEN: No acute distress.   Neck: No JVD Cardiac: RRR, no murmurs, rubs, or gallops.  Respiratory:  Mild bibasilar rales.  Nonlabored.  Good air movement. GI: Soft, nontender, non-distended  MS: No edema; No deformity. Right femoral cath site stable.  Mild bruising, but no obvious hematoma site.  No flank bruising or tenderness. Neuro:  Nonfocal  Psych: Normal affect   Labs    High Sensitivity Troponin:  No results for input(s): TROPONINIHS in the last 720 hours.    Chemistry Recent Labs  Lab 12/17/19 1042 12/17/19 1042 12/19/19 0547 12/19/19 0547 12/19/19 0814 12/19/19 0817 12/20/19 0311  NA 139  --  136   < > 138 137 139  K 3.0*   < > 5.1   < > 3.1* 3.0* 3.6  CL 97  --  94*  --   --   --  99  CO2 30*  --  29  --   --   --  28  GLUCOSE 57*  --  133*  --   --   --  133*  BUN 41*  --  34*  --   --   --  76*  CREATININE 4.95*  --  5.10*  --   --   --  5.94*  CALCIUM 8.8  --  8.8*  --   --   --  8.4*  GFRNONAA 11*  --  11*  --   --   --  9*  GFRAA 13*  --  12*  --   --   --  10*  ANIONGAP  --   --  13  --   --   --  12   < > = values in this interval not displayed.     Hematology Recent Labs  Lab 12/19/19 0547 12/19/19 3818 12/19/19 0817 12/20/19 0311 12/20/19 0628  WBC 3.5*  --   --  4.7 4.8  RBC 3.42*  --   --  2.56* 2.67*  HGB 9.3*   < > 9.5* 7.0* 7.3*  HCT 29.7*   < > 28.0* 22.5* 23.1*  MCV 86.8  --   --  87.9 86.5  MCH 27.2  --   --  27.3 27.3  MCHC 31.3  --   --  31.1 31.6  RDW 18.6*  --   --  19.0* 19.0*  PLT 149*  --   --  142* 147*   < > = values in this interval not displayed.    BNPNo results for input(s): BNP, PROBNP in the last 168 hours.   DDimer No results for input(s): DDIMER in the last 168 hours.   Radiology    CARDIAC CATHETERIZATION  Addendum Date: 12/19/2019    Prox RCA lesion is 100% stenosed. SVG to RCA is widely patent.  Dist  LAD lesion is 90% stenosed. Too small and distal for PCI.  3rd Diag lesion is 50% stenosed.  Prox Cx lesion is 100% stenosed. Left to left collaterals.  Ramus lesion is 100% stenosed.  SVG to ramus with Origin to Prox Graft lesion is 95% stenosed.  A drug-eluting stent was successfully placed using a SYNERGY XD 3.50X48, postdilated to >4 mm. Optimized with IVUS.  Post intervention, there is a 0% residual stenosis.  There is moderate to severe left ventricular systolic dysfunction.  There is no aortic valve stenosis.  Hemodynamic findings consistent with moderate pulmonary hypertension.  Continue dual antiplatelet therapy for 6 months at least.  COnsider clopidogrel longer term if there is no interaction with his HIV meds, given the diffuse nature of CAD.   Result Date: 12/19/2019  Prox RCA lesion is 100% stenosed. SVG to RCA is widely patent.  Dist LAD lesion is 90% stenosed. Too small and distal for PCI.  3rd Diag lesion is 50% stenosed.  Prox Cx lesion is 100% stenosed. Left to left collaterals.  Ramus lesion is 100% stenosed.  SVG to ramus with Origin to Prox Graft lesion is 95% stenosed.  A drug-eluting stent was successfully placed using a SYNERGY XD 3.50X48, postdilated to >4 mm. Optimized with IVUS.  Post intervention, there is a 0% residual stenosis.  There is moderate to severe left ventricular systolic dysfunction.  There is no aortic valve stenosis.  Hemodynamic findings consistent with moderate pulmonary hypertension.  Continue dual antiplatelet therapy for 6 months at least.  COnsider clopidogrel longer term if there is no interaction with his HIV meds, given the diffuse nature of CAD.    Cardiac Studies   Cath: 12/19/19   Prox RCA lesion is 100% stenosed. SVG  to RCA is widely patent.  Dist LAD lesion is 90% stenosed. Too small and distal for PCI.  3rd Diag lesion is 50% stenosed.  Prox Cx lesion is 100% stenosed. Left to left collaterals.  Ramus lesion is 100%  stenosed.  SVG to ramus with Origin to Prox Graft lesion is 95% stenosed.  A drug-eluting stent was successfully placed using a SYNERGY XD 3.50X48, postdilated to >4 mm. Optimized with IVUS.  Post intervention, there is a 0% residual stenosis.  There is moderate to severe left ventricular systolic dysfunction.  There is no aortic valve stenosis.  Hemodynamic findings consistent with moderate pulmonary hypertension.   Continue dual antiplatelet therapy for 6 months at least.  COnsider clopidogrel longer term if there is no interaction with his HIV meds, given the diffuse nature of CAD.   Diagnostic Dominance: Right  Intervention     Patient Profile     69 y.o. male with PMH of CABG in '04, ESRD on HD, COPD, HTN, HL, HIV, DM, Chronic systolic HF who presented for outpatient cardiac cath.   Assessment & Plan    1. CAD s/p CABG: underwent outpatient cardiac cath with PCI/DES x1 to SVG-Ramus, patent SVG-RCA. Placed on DAPT with ASA/Brilinta for at least 6 months. Had some chest pain and shortness of breath post cath, but now resolved this morning.  -- continue ASA/Brilinta, on statin  -- needs to work with cardiac rehab this morning  2. Anemia: Hgb dropped from 10>>7.3 this morning. Cath site stable. Reports he has had issues with blood in his stool over the past month. Had a colonoscopy about 5 years prior which was normal per his report. No blood noted overnight. With significant drop with review with MD regarding scan to r/o PSA -groin site seems relatively stable, I think ultrasound Doppler to exclude pseudoaneurysm or extravasation is not unreasonable.  He was seen by Nephrology NP along with me, they plan to do his dialysis here today and he will receive blood as well as additional volume removal as he has basal rales on exam..  -- guaiac stools   3. ESRD on HD: initially planned to discharge home for outpatient HD today. Given his decline in H/H with consult nephrology for inpt  HD needs. Suspect he can receive PRBCs with HD today.   4. HTN: stable with current therapy.   5. HL: on high dose statin and fenofibrate   For questions or updates, please contact [place] Please consult [email or web address] for contact info under    Signed, Reino Bellis, NP  12/20/2019, 8:02 AM    ATTENDING ATTESTATION  I have seen, examined and evaluated the patient this AM on rounds along with Reino Bellis, NP-C.  After reviewing all the available data and chart, we discussed the patients laboratory, study & physical findings as well as symptoms in detail. I agree with her findings, examination as well as impression recommendations as per our discussion.    Attending adjustments noted in italics.   Plan was to discharge him home today.  We will do his outpatient hemodialysis at his normal site (his preference), unfortunately with his drop in hemoglobin, we are committed to keeping him here until his blood counts normalized some.  This is a pretty significant drop in hemoglobin that is not really explained by exam or blood pressure/hemodynamic findings.  He tells me that he did have some melanotic stool today, and it is possible that he has had a GI bleed.  He clearly has end-stage  renal disease and has anemia related to that as well.  Potentially he is hemodiluted with volume overload based on basal rales.  Would like to see what his hemoglobin looks like following dialysis after a unit of blood was given.  If it is stable, he is probably ready for discharge in the afternoon provided there is no mass GI bleed found.  Apparently he was supposed be seeing gastroenterology today which had to be canceled/delayed because of his heart catheterization yesterday.  Major concern is is that he is now status post recent stent placement which would make any plans for colonoscopy less than favorable as I would not want to hold his DAPT for at least 3 months.  We will reassess in the afternoon to  determine stability for discharge. -->  Plan is to recheck hemoglobin levels within 2 hours of him completing dialysis.  Provided hemoglobin levels have improved, anticipate he should be okay to discharge.  Have discussed with the nephrology service and it does seem that his hemoglobin levels tend to run in the 8-9 range and not the 10 range that it was when he came in.  He is likely not that far away from his baseline.  Glenetta Hew, M.D., M.S. Interventional Cardiologist   Pager # [phone] Phone # [phone] [address]. [place], [place] [address]

## 2019-12-20 NOTE — Progress Notes (Signed)
   Called by RN with patient having another dark tarry stool this afternoon. I talked with the patient and he reports he has had several episodes today. Given his anemia and recently being placed on Brilinta will defer DC today. Suspect he may need a GI consult in the am pending his CBC. I updated the patient on this plan, he was not happy but agreeable to stay overnight.   SignedReino Bellis, NP-C 12/20/2019, 7:17 PM Pager: [phone]

## 2019-12-21 DIAGNOSIS — I255 Ischemic cardiomyopathy: Secondary | ICD-10-CM | POA: Diagnosis present

## 2019-12-21 DIAGNOSIS — K921 Melena: Secondary | ICD-10-CM | POA: Diagnosis not present

## 2019-12-21 DIAGNOSIS — E785 Hyperlipidemia, unspecified: Secondary | ICD-10-CM | POA: Diagnosis present

## 2019-12-21 DIAGNOSIS — R9439 Abnormal result of other cardiovascular function study: Secondary | ICD-10-CM | POA: Diagnosis present

## 2019-12-21 DIAGNOSIS — Z992 Dependence on renal dialysis: Secondary | ICD-10-CM | POA: Diagnosis not present

## 2019-12-21 DIAGNOSIS — N2581 Secondary hyperparathyroidism of renal origin: Secondary | ICD-10-CM | POA: Diagnosis present

## 2019-12-21 DIAGNOSIS — Z20822 Contact with and (suspected) exposure to covid-19: Secondary | ICD-10-CM | POA: Diagnosis present

## 2019-12-21 DIAGNOSIS — J449 Chronic obstructive pulmonary disease, unspecified: Secondary | ICD-10-CM | POA: Diagnosis present

## 2019-12-21 DIAGNOSIS — I2510 Atherosclerotic heart disease of native coronary artery without angina pectoris: Principal | ICD-10-CM

## 2019-12-21 DIAGNOSIS — K31819 Angiodysplasia of stomach and duodenum without bleeding: Secondary | ICD-10-CM

## 2019-12-21 DIAGNOSIS — I252 Old myocardial infarction: Secondary | ICD-10-CM | POA: Diagnosis not present

## 2019-12-21 DIAGNOSIS — I2583 Coronary atherosclerosis due to lipid rich plaque: Secondary | ICD-10-CM

## 2019-12-21 DIAGNOSIS — Z951 Presence of aortocoronary bypass graft: Secondary | ICD-10-CM | POA: Diagnosis not present

## 2019-12-21 DIAGNOSIS — E7800 Pure hypercholesterolemia, unspecified: Secondary | ICD-10-CM | POA: Diagnosis not present

## 2019-12-21 DIAGNOSIS — D509 Iron deficiency anemia, unspecified: Secondary | ICD-10-CM

## 2019-12-21 DIAGNOSIS — D62 Acute posthemorrhagic anemia: Secondary | ICD-10-CM

## 2019-12-21 DIAGNOSIS — F17210 Nicotine dependence, cigarettes, uncomplicated: Secondary | ICD-10-CM | POA: Diagnosis present

## 2019-12-21 DIAGNOSIS — I10 Essential (primary) hypertension: Secondary | ICD-10-CM

## 2019-12-21 DIAGNOSIS — K31811 Angiodysplasia of stomach and duodenum with bleeding: Secondary | ICD-10-CM

## 2019-12-21 DIAGNOSIS — D649 Anemia, unspecified: Secondary | ICD-10-CM | POA: Diagnosis not present

## 2019-12-21 DIAGNOSIS — Z820 Family history of epilepsy and other diseases of the nervous system: Secondary | ICD-10-CM | POA: Diagnosis not present

## 2019-12-21 DIAGNOSIS — I25709 Atherosclerosis of coronary artery bypass graft(s), unspecified, with unspecified angina pectoris: Secondary | ICD-10-CM | POA: Diagnosis not present

## 2019-12-21 DIAGNOSIS — Z823 Family history of stroke: Secondary | ICD-10-CM | POA: Diagnosis not present

## 2019-12-21 DIAGNOSIS — E1122 Type 2 diabetes mellitus with diabetic chronic kidney disease: Secondary | ICD-10-CM | POA: Diagnosis present

## 2019-12-21 DIAGNOSIS — D631 Anemia in chronic kidney disease: Secondary | ICD-10-CM | POA: Diagnosis not present

## 2019-12-21 DIAGNOSIS — I429 Cardiomyopathy, unspecified: Secondary | ICD-10-CM

## 2019-12-21 DIAGNOSIS — I272 Pulmonary hypertension, unspecified: Secondary | ICD-10-CM | POA: Diagnosis present

## 2019-12-21 DIAGNOSIS — E1142 Type 2 diabetes mellitus with diabetic polyneuropathy: Secondary | ICD-10-CM | POA: Diagnosis present

## 2019-12-21 DIAGNOSIS — Z21 Asymptomatic human immunodeficiency virus [HIV] infection status: Secondary | ICD-10-CM | POA: Diagnosis present

## 2019-12-21 DIAGNOSIS — N186 End stage renal disease: Secondary | ICD-10-CM | POA: Diagnosis not present

## 2019-12-21 DIAGNOSIS — I132 Hypertensive heart and chronic kidney disease with heart failure and with stage 5 chronic kidney disease, or end stage renal disease: Secondary | ICD-10-CM | POA: Diagnosis not present

## 2019-12-21 DIAGNOSIS — K5730 Diverticulosis of large intestine without perforation or abscess without bleeding: Secondary | ICD-10-CM | POA: Diagnosis present

## 2019-12-21 DIAGNOSIS — I5020 Unspecified systolic (congestive) heart failure: Secondary | ICD-10-CM | POA: Diagnosis present

## 2019-12-21 LAB — BASIC METABOLIC PANEL
Anion gap: 13 (ref 5–15)
BUN: 37 mg/dL — ABNORMAL HIGH (ref 8–23)
CO2: 26 mmol/L (ref 22–32)
Calcium: 8.9 mg/dL (ref 8.9–10.3)
Chloride: 99 mmol/L (ref 98–111)
Creatinine, Ser: 4.47 mg/dL — ABNORMAL HIGH (ref 0.61–1.24)
GFR calc Af Amer: 14 mL/min — ABNORMAL LOW (ref >60)
GFR calc non Af Amer: 12 mL/min — ABNORMAL LOW (ref >60)
Glucose, Bld: 83 mg/dL (ref 70–99)
Potassium: 3.8 mmol/L (ref 3.5–5.1)
Sodium: 138 mmol/L (ref 135–145)

## 2019-12-21 LAB — LIPID PANEL
Cholesterol: 65 mg/dL (ref 0–200)
HDL: 17 mg/dL — ABNORMAL LOW (ref >40)
LDL Cholesterol: 29 mg/dL (ref 0–99)
Total CHOL/HDL Ratio: 3.8 RATIO
Triglycerides: 95 mg/dL (ref <150)
VLDL: 19 mg/dL (ref 0–40)

## 2019-12-21 LAB — BPAM RBC
Blood Product Expiration Date: 202103162359
ISSUE DATE / TIME: 202102261327
Unit Type and Rh: 6200

## 2019-12-21 LAB — CBC
HCT: 28.3 % — ABNORMAL LOW (ref 39.0–52.0)
Hemoglobin: 9 g/dL — ABNORMAL LOW (ref 13.0–17.0)
MCH: 28 pg (ref 26.0–34.0)
MCHC: 31.8 g/dL (ref 30.0–36.0)
MCV: 87.9 fL (ref 80.0–100.0)
Platelets: 158 10*3/uL (ref 150–400)
RBC: 3.22 MIL/uL — ABNORMAL LOW (ref 4.22–5.81)
RDW: 19.2 % — ABNORMAL HIGH (ref 11.5–15.5)
WBC: 5.6 10*3/uL (ref 4.0–10.5)
nRBC: 0.7 % — ABNORMAL HIGH (ref 0.0–0.2)

## 2019-12-21 LAB — TYPE AND SCREEN
ABO/RH(D): A POS
Antibody Screen: NEGATIVE
Unit division: 0

## 2019-12-21 LAB — GLUCOSE, CAPILLARY
Glucose-Capillary: 97 mg/dL (ref 70–99)
Glucose-Capillary: 134 mg/dL — ABNORMAL HIGH (ref 70–99)
Glucose-Capillary: 160 mg/dL — ABNORMAL HIGH (ref 70–99)
Glucose-Capillary: 161 mg/dL — ABNORMAL HIGH (ref 70–99)

## 2019-12-21 LAB — HEMOGLOBIN AND HEMATOCRIT, BLOOD
HCT: 27 % — ABNORMAL LOW (ref 39.0–52.0)
HCT: 25.2 % — ABNORMAL LOW (ref 39.0–52.0)
Hemoglobin: 8.7 g/dL — ABNORMAL LOW (ref 13.0–17.0)
Hemoglobin: 8.1 g/dL — ABNORMAL LOW (ref 13.0–17.0)

## 2019-12-21 MED ORDER — PANTOPRAZOLE SODIUM 40 MG IV SOLR
40.0000 mg | Freq: Two times a day (BID) | INTRAVENOUS | Status: DC
  Administered 2019-12-21 (×2): 40 mg via INTRAVENOUS
  Filled 2019-12-21 (×3): qty 40

## 2019-12-21 NOTE — Progress Notes (Signed)
Progress Note  Patient Name: Jacob [name] Date of Encounter: 12/21/2019  Primary Cardiologist: No primary care provider on file.   Subjective   Denies any chest pain or SOB.  Had black tarry stools yesterday and discharge cancelled.  Hbg dropped from 9.5 to 7.3 yesterday but back up to 9 this am.  Inpatient Medications    Scheduled Meds: . sodium chloride   Intravenous Once  . allopurinol  100 mg Oral Daily  . aspirin  81 mg Oral Daily  . atorvastatin  80 mg Oral q1800  . bictegravir-emtricitabine-tenofovir AF  1 tablet Oral Daily  . calcitRIOL  1 mcg Oral Q M,W,F-HD  . fenofibrate  160 mg Oral Daily  . gabapentin  300 mg Oral Daily  . insulin aspart  0-15 Units Subcutaneous TID WC  . insulin aspart  0-5 Units Subcutaneous QHS  . isosorbide mononitrate  30 mg Oral Daily  . latanoprost  1 drop Both Eyes QHS  . Melatonin  3 mg Oral QHS  . multivitamin  1 tablet Oral QHS  . pantoprazole  40 mg Oral Daily  . sevelamer carbonate  800 mg Oral TID WC  . sodium chloride flush  3 mL Intravenous Q12H  . ticagrelor  90 mg Oral BID  . timolol  1 drop Both Eyes Daily  . umeclidinium-vilanterol  1 puff Inhalation Daily   Continuous Infusions: . sodium chloride     PRN Meds: sodium chloride, acetaminophen, colchicine, diclofenac Sodium, docusate sodium, nitroGLYCERIN, ondansetron (ZOFRAN) IV, polyethylene glycol, simethicone, sodium chloride flush   Vital Signs    Vitals:   12/20/19 1730 12/20/19 1755 12/20/19 2025 12/21/19 0401  BP: 136/73 126/67 (!) 142/83 123/60  Pulse: 87 84 94 80  Resp:  17 20 19   Temp:  (!) 97.5 F (36.4 C) 97.6 F (36.4 C) (!) 97.5 F (36.4 C)  TempSrc:  Oral Oral Oral  SpO2:  96% 100% 100%  Weight:  81.1 kg  81.5 kg  Height:        Intake/Output Summary (Last 24 hours) at 12/21/2019 6195 Last data filed at 12/20/2019 1755 Gross per 24 hour  Intake 630 ml  Output 1500 ml  Net -870 ml   Last 3 Weights 12/21/2019 12/20/2019 12/20/2019  Weight  (lbs) 179 lb 11.2 oz 178 lb 12.7 oz 179 lb 14.3 oz  Weight (kg) 81.511 kg 81.1 kg 81.6 kg      Telemetry    NSR - Personally Reviewed  ECG    No new EKG to review- Personally Reviewed  Physical Exam  GEN: Well nourished, well developed in no acute distress HEENT: Normal NECK: No JVD; No carotid bruits LYMPHATICS: No lymphadenopathy CARDIAC:RRR, no murmurs, rubs, gallops RESPIRATORY:  Clear to auscultation without rales, wheezing or rhonchi  ABDOMEN: Soft, non-tender, non-distended MUSCULOSKELETAL:  No edema; No deformity  SKIN: Warm and dry NEUROLOGIC:  Alert and oriented x 3 PSYCHIATRIC:  Normal affect    Labs    High Sensitivity Troponin:  No results for input(s): TROPONINIHS in the last 720 hours.    Chemistry Recent Labs  Lab 12/19/19 0547 12/19/19 0814 12/19/19 0817 12/20/19 0311 12/21/19 0352  NA 136   < > 137 139 138  K 5.1   < > 3.0* 3.6 3.8  CL 94*  --   --  99 99  CO2 29  --   --  28 26  GLUCOSE 133*  --   --  133* 83  BUN 34*  --   --  76* 37*  CREATININE 5.10*  --   --  5.94* 4.47*  CALCIUM 8.8*  --   --  8.4* 8.9  GFRNONAA 11*  --   --  9* 12*  GFRAA 12*  --   --  10* 14*  ANIONGAP 13  --   --  12 13   < > = values in this interval not displayed.     Hematology Recent Labs  Lab 12/20/19 0311 12/20/19 0628 12/21/19 0352  WBC 4.7 4.8 5.6  RBC 2.56* 2.67* 3.22*  HGB 7.0* 7.3* 9.0*  HCT 22.5* 23.1* 28.3*  MCV 87.9 86.5 87.9  MCH 27.3 27.3 28.0  MCHC 31.1 31.6 31.8  RDW 19.0* 19.0* 19.2*  PLT 142* 147* 158    BNPNo results for input(s): BNP, PROBNP in the last 168 hours.   DDimer No results for input(s): DDIMER in the last 168 hours.   Radiology    CARDIAC CATHETERIZATION  Addendum Date: 12/19/2019    Prox RCA lesion is 100% stenosed. SVG to RCA is widely patent.  Dist LAD lesion is 90% stenosed. Too small and distal for PCI.  3rd Diag lesion is 50% stenosed.  Prox Cx lesion is 100% stenosed. Left to left collaterals.  Ramus  lesion is 100% stenosed.  SVG to ramus with Origin to Prox Graft lesion is 95% stenosed.  A drug-eluting stent was successfully placed using a SYNERGY XD 3.50X48, postdilated to >4 mm. Optimized with IVUS.  Post intervention, there is a 0% residual stenosis.  There is moderate to severe left ventricular systolic dysfunction.  There is no aortic valve stenosis.  Hemodynamic findings consistent with moderate pulmonary hypertension.  Continue dual antiplatelet therapy for 6 months at least.  COnsider clopidogrel longer term if there is no interaction with his HIV meds, given the diffuse nature of CAD.   Result Date: 12/19/2019  Prox RCA lesion is 100% stenosed. SVG to RCA is widely patent.  Dist LAD lesion is 90% stenosed. Too small and distal for PCI.  3rd Diag lesion is 50% stenosed.  Prox Cx lesion is 100% stenosed. Left to left collaterals.  Ramus lesion is 100% stenosed.  SVG to ramus with Origin to Prox Graft lesion is 95% stenosed.  A drug-eluting stent was successfully placed using a SYNERGY XD 3.50X48, postdilated to >4 mm. Optimized with IVUS.  Post intervention, there is a 0% residual stenosis.  There is moderate to severe left ventricular systolic dysfunction.  There is no aortic valve stenosis.  Hemodynamic findings consistent with moderate pulmonary hypertension.  Continue dual antiplatelet therapy for 6 months at least.  COnsider clopidogrel longer term if there is no interaction with his HIV meds, given the diffuse nature of CAD.   VAS [place] LOWER EXTREMITY ARTERIAL DUPLEX  Result Date: 12/20/2019 LOWER EXTREMITY ARTERIAL DUPLEX STUDY Indications: Post op cath. High Risk Factors: Hypertension, hyperlipidemia, coronary artery disease.  Current ABI: Comparison Study: No prior studies. Performing Technologist: Oliver Hum RVT  Examination Guidelines: A complete evaluation includes B-mode imaging, spectral Doppler, color Doppler, and power Doppler as needed of all accessible portions  of each vessel. Bilateral testing is considered an integral part of a complete examination. Limited examinations for reoccurring indications may be performed as noted.  +--------+--------+-----+--------+---------+--------+ RIGHT   PSV cm/sRatioStenosisWaveform Comments +--------+--------+-----+--------+---------+--------+ CFA Mid                      triphasic         +--------+--------+-----+--------+---------+--------+ DFA  triphasic         +--------+--------+-----+--------+---------+--------+ SFA Prox                     triphasic         +--------+--------+-----+--------+---------+--------+  Summary: Right: There is no evidence of pseudoaneurysm.  See table(s) above for measurements and observations. Electronically signed by Servando Snare MD on 12/20/2019 at 3:24:37 PM.    Final     Cardiac Studies   Cath: 12/19/19   Prox RCA lesion is 100% stenosed. SVG to RCA is widely patent.  Dist LAD lesion is 90% stenosed. Too small and distal for PCI.  3rd Diag lesion is 50% stenosed.  Prox Cx lesion is 100% stenosed. Left to left collaterals.  Ramus lesion is 100% stenosed.  SVG to ramus with Origin to Prox Graft lesion is 95% stenosed.  A drug-eluting stent was successfully placed using a SYNERGY XD 3.50X48, postdilated to >4 mm. Optimized with IVUS.  Post intervention, there is a 0% residual stenosis.  There is moderate to severe left ventricular systolic dysfunction.  There is no aortic valve stenosis.  Hemodynamic findings consistent with moderate pulmonary hypertension.   Continue dual antiplatelet therapy for 6 months at least.  COnsider clopidogrel longer term if there is no interaction with his HIV meds, given the diffuse nature of CAD.   Diagnostic Dominance: Right  Intervention     Patient Profile     69 y.o. male with PMH of CABG in '04, ESRD on HD, COPD, HTN, HL, HIV, DM, Chronic systolic HF who presented for  outpatient cardiac cath.   Assessment & Plan    1. CAD s/p CABG:  -s/p cath with PCI/DES x1 to SVG-Ramus, patent SVG-RCA. -now on DAPT with ASA and Brilinta 90mg  BID for at least 6 months -denies any further CP or SOB -d/c cancelled yesterday due to black tarry stools and drop in Hbg to 7.3 -continue statin and long acting nitrates  2. Anemia:  -Hgb dropped from 10>>7.3 yesterday with blood in stool. -Reports he has had issues with blood in his stool over the past month.  -Had a colonoscopy about 5 years prior which was normal per his report.  -stool guaic positive yesterday -had more dark stools overnight -plan for GI consult this am -Hbg improved to 9 this am after PRBCs yesterday with HD -hemodynamically stable  3. ESRD on HD: - followed by Neprhology -had HD yesterday  4. HTN:  -BP controlled -on no BP meds at present  5. HLD: -continue Atorva 80mg  daily and fenofibrate -repeat FLP this am  For questions or updates, please contact [place] Please consult [email or web address] for contact info under    Signed, Fransico Him, MD  12/21/2019, 7:27 AM

## 2019-12-21 NOTE — Consult Note (Signed)
Consultation  Referring Provider: Cardiology/Dr. Radford Pax  primary Care Physician:  [name], NP Primary Gastroenterologist:  .  Dr. Henrene Pastor  Reason for Consultation: GI bleed  HPI: Jacob [name] is a 69 y.o. male, with history of multiple serious medical problems including coronary artery disease for which she underwent CABG x4 in 2004, congestive heart failure, COPD, end-stage renal disease on dialysis and HIV. Patient underwent outpatient cardiac cath on 12/19/2019 and required PCI/DES x1 to SVG-ramus.  Now on aspirin and Brilinta.  He was admitted post cath with complaints of chest pain and some shortness of breath.  Yesterday was noted to have drop in hemoglobin from 9.5-7.3 and had complained of dark stools.  Today hemoglobin back up to 9 transfusion of 1 unit.  Patient says he had noticed dark stools for a few days last week but this had stopped prior to catheterization.  Black stools recurred post cath and have persisted.  He denies any abdominal pain, no nausea or vomiting.  He had 2 tarry stools today.  He is complaining of severe burning type pain in his feet.  Patient is established with Dr. Henrene Pastor, last colonoscopy December 2013 with removal of 1 2 mm polyp noted to have moderate sigmoid diverticulosis.  He had undergone EGD in April 2020 while hospitalized per Dr. Ardis Hughs for melena and anemia and was found to have a few angio ectasia in the stomach and duodenal bulb disease 4-5 (total).  These were treated with APC. His baseline hemoglobin is in the 8-9 range   Past Medical History:  Diagnosis Date  . Acute respiratory failure ([place]) 03/01/2018  . Arthritis    "all over; mostly knees and back" (02/28/2018)  . CHF (congestive heart failure) ([facility])    not on any meds  . Chronic lower back pain    stenosis  . Community acquired pneumonia 09/06/2013  . COPD (chronic obstructive pulmonary disease) ([place])   . Coronary atherosclerosis of native coronary artery 2005   s/p  surgery  . Drug abuse ([place])    hx; tested for cocaine as recently as 2/08. says he is not using drugs now - avoided defib. for this reason   . ESRD (end stage renal disease) ([place])    Hemo M-W-F- Richarda Blade  . GERD (gastroesophageal reflux disease)    takes OTC meds as needed  . GI bleeding 02/06/2019  . Glaucoma    uses eye drops daily  . Hepatitis B 1968   "tx'd w/isolation; caught it from toilet stools in gym"  . History of blood transfusion 03/01/2019  . History of colon polyps    benign  . History of gout    takes Allopurinol daily as well as Colchicine-if needed (02/28/2018)  . History of kidney stones   . HTN (hypertension)    takes Coreg,Imdur.and Apresoline daily  . Human immunodeficiency virus (HIV) disease ([place]) dx'd 1995   takes Genvoya daily  . Hyperlipidemia    takes Atorvastatin daily  . Ischemic cardiomyopathy   . Muscle spasm    takes Zanaflex as needed  . Myocardial infarction ([place]) ~ 2004/2005  . Nocturia   . Peripheral neuropathy    takes gabapentin daily  . Pneumonia    "at least twice" (02/28/2018)  . Shortness of breath dyspnea    rarely but if notices it then with exertion  . Syphilis, unspecified   . Type II diabetes mellitus ([place]) 2004   Lantus daily.Average fasting blood sugar 125-199  . Wears glasses   .  Wears partial dentures     Past Surgical History:  Procedure Laterality Date  . AV FISTULA PLACEMENT Left 08/02/2018   Procedure: ARTERIOVENOUS (AV) FISTULA CREATION  left arm radiocephlic;  Surgeon: Marty Heck, MD;  Location: [facility];  Service: Vascular;  Laterality: Left;  . AV FISTULA PLACEMENT Left 08/01/2019   Procedure: LEFT BRACHIOCEPHALIC ARTERIOVENOUS (AV) FISTULA CREATION;  Surgeon: [name], MD;  Location: [facility];  Service: Vascular;  Laterality: Left;  . BASCILIC VEIN TRANSPOSITION Left 10/03/2019   Procedure: BASILIC VEIN TRANSPOSITION LEFT SECOND STAGE;  Surgeon: [name], MD;  Location: [facility];  Service: Vascular;   Laterality: Left;  . CARDIAC CATHETERIZATION  10/2002; 12/19/2004   [name] 03/08/2011  . COLONOSCOPY  2013   Dr.John Henrene Pastor   . CORONARY ARTERY BYPASS GRAFT  02/24/2003   CABG X2/notes 03/08/2011  . CORONARY STENT INTERVENTION N/A 12/19/2019   Procedure: CORONARY STENT INTERVENTION;  Surgeon: Jettie Booze, MD;  Location: [facility];  Service: Cardiovascular;  Laterality: N/A;  . ESOPHAGOGASTRODUODENOSCOPY (EGD) WITH PROPOFOL N/A 02/08/2019   Procedure: ESOPHAGOGASTRODUODENOSCOPY (EGD) WITH PROPOFOL;  Surgeon: Milus Banister, MD;  Location: [facility];  Service: Gastroenterology;  Laterality: N/A;  . HOT HEMOSTASIS N/A 02/08/2019   Procedure: HOT HEMOSTASIS (ARGON PLASMA COAGULATION/BICAP);  Surgeon: Milus Banister, MD;  Location[facility];  Service: Gastroenterology;  Laterality: N/A;  . INTERTROCHANTERIC HIP FRACTURE SURGERY Left 11/2006   [name] 03/08/2011  . INTRAVASCULAR ULTRASOUND/IVUS N/A 12/19/2019   Procedure: Intravascular Ultrasound/IVUS;  Surgeon: Jettie Booze, MD;  Location: [place] [facility];  Service: Cardiovascular;  Laterality: N/A;  . IR FLUORO GUIDE CV LINE RIGHT  07/24/2019  . IR FLUORO GUIDE CV LINE RIGHT  07/30/2019  . IR US GUIDE VASC ACCESS RIGHT  07/24/2019  . IR US GUIDE VASC ACCESS RIGHT  07/30/2019  . LAPAROSCOPIC CHOLECYSTECTOMY  05/2006  . LIGATION OF COMPETING BRANCHES OF ARTERIOVENOUS FISTULA Left 11/05/2018   Procedure: LIGATION OF COMPETING BRANCHES OF ARTERIOVENOUS FISTULA  LEFT  ARM;  Surgeon: Marty Heck, MD;  Location: [place];  Service: Vascular;  Laterality: Left;  . LUMBAR LAMINECTOMY/DECOMPRESSION MICRODISCECTOMY N/A 02/29/2016   Procedure: Left L4-5 Lateral Recess Decompression, Removal Extradural Intraspinal Facet Cyst;  Surgeon: Marybelle Killings, MD;  Location: [place];  Service: Orthopedics;  Laterality: N/A;  . MULTIPLE TOOTH EXTRACTIONS    . ORIF MANDIBULAR FRACTURE Left 08/13/2004   ORIF of left body fracture mandible  with KLS Martin 2.3-mm six hole/notes 03/08/2011  . RIGHT/LEFT HEART CATH AND CORONARY/GRAFT ANGIOGRAPHY N/A 12/19/2019   Procedure: RIGHT/LEFT HEART CATH AND CORONARY/GRAFT ANGIOGRAPHY;  Surgeon: Jettie Booze, MD;  Location: [place] [facility];  Service: Cardiovascular;  Laterality: N/A;    Prior to Admission medications   Medication Sig Start Date End Date Taking? Authorizing Provider  acetaminophen (TYLENOL) 500 MG tablet Take 1,000 mg by mouth every 6 (six) hours as needed for moderate pain.    Yes [provider]  allopurinol (ZYLOPRIM) 100 MG tablet TAKE 1 TABLET(100 MG) BY MOUTH DAILY 07/15/19  Yes Eubanks, [name], NP  ANORO ELLIPTA 62.5-25 MCG/INH AEPB INHALE 1 PUFF BY MOUTH EVERY DAY 12/11/19  Yes [name], Dinah C, NP  aspirin EC 81 MG tablet Take 81 mg by mouth daily.   Yes [provider]  atorvastatin (LIPITOR) 80 MG tablet Take 1 tablet (80 mg total) by mouth daily. 12/05/18  Yes [name], NP  BIKTARVY 50-200-25 MG TABS  tablet TAKE 1 TABLET BY MOUTH DAILY 07/12/19  Yes [name], MD  Choline Fenofibrate (FENOFIBRIC ACID) 135 MG CPDR TAKE 1 CAPSULE BY MOUTH DAILY 09/23/19  Yes [name], NP  diclofenac Sodium (VOLTAREN) 1 % GEL Apply 2 g topically 3 (three) times daily as needed (pain).   Yes [provider]  docusate sodium (COLACE) 100 MG capsule Take 100 mg by mouth daily as needed for mild constipation or moderate constipation.   Yes [provider]  gabapentin (NEURONTIN) 300 MG capsule Take 1 capsule (300 mg total) by mouth daily. 10/29/19  Yes [name], NP  insulin lispro (HUMALOG) 100 UNIT/ML injection Inject 20 Units into the skin 3 (three) times daily as needed for high blood sugar (above 150).    Yes [provider]  isosorbide mononitrate (IMDUR) 30 MG 24 hr tablet TAKE 1 TABLET(30 MG) BY MOUTH DAILY 08/09/19  Yes Eubanks, [name], NP  latanoprost (XALATAN) 0.005 % ophthalmic solution Place  1 drop into both eyes at bedtime.   Yes [provider]  Melatonin 3 MG TABS Take 1 tablet (3 mg total) by mouth at bedtime. 11/19/19  Yes [name], Dinah C, NP  multivitamin (RENA-VIT) TABS tablet Take 1 tablet by mouth daily.   Yes [provider]  nitroGLYCERIN (NITROSTAT) 0.3 MG SL tablet ONE TABLET UNDER TONGUE AS NEEDED FOR CHEST PAIN 05/01/19  Yes [name], MD  pantoprazole (PROTONIX) 40 MG tablet TAKE 1 TABLET(40 MG) BY MOUTH DAILY 12/11/19  Yes [name], Dinah C, NP  polyethylene glycol (MIRALAX / GLYCOLAX) 17 g packet Take 17 g by mouth daily as needed for moderate constipation.   Yes [provider]  sevelamer carbonate (RENVELA) 800 MG tablet Take 800 mg by mouth 3 (three) times daily with meals.   Yes [provider]  simethicone (MYLICON) 80 MG chewable tablet Chew 1 tablet (80 mg total) by mouth 4 (four) times daily as needed for flatulence. 08/16/19  Yes [name], NP  timolol (TIMOPTIC) 0.5 % ophthalmic solution Place 1 drop into both eyes every morning. 04/04/19  Yes [provider]  B-D UF III MINI PEN NEEDLES 31G X 5 MM MISC USE FOUR TIMES DAILY 02/25/19   [name], NP  colchicine 0.6 MG tablet Take 0.6 mg by mouth daily as needed (pain).    [provider]  glucose blood test strip Use to test blood sugar three times daily. E11.40 04/15/19   [name], NP  Lancets [facility] DELICA PLUS [id number]) MISC Inject 1 Device as directed 3 (three) times daily. Dx: E11.40 09/18/19   [name], NP  ticagrelor (BRILINTA) 90 MG TABS tablet Take 1 tablet (90 mg total) by mouth 2 (two) times daily. 12/20/19   Cheryln Manly, NP    Current Facility-Administered Medications  Medication Dose Route Frequency Provider Last Rate Last Admin  . 0.9 %  sodium chloride infusion (Manually program via Guardrails IV Fluids)   Intravenous Once Valentina Gu, NP      . 0.9 %  sodium chloride infusion  250 mL  Intravenous PRN Jettie Booze, MD      . acetaminophen (TYLENOL) tablet 650 mg  650 mg Oral Q4H PRN Jettie Booze, MD   650 mg at 12/21/19 0201  . allopurinol (ZYLOPRIM) tablet 100 mg  100 mg Oral Daily Jettie Booze, MD   100 mg at 12/21/19 0615  . aspirin chewable tablet 81 mg  81 mg Oral Daily Jettie Booze, MD   81 mg at 12/21/19 4540  . atorvastatin (LIPITOR) tablet 80 mg  80 mg Oral q1800 Jettie Booze, MD   80 mg at 12/19/19 1633  . bictegravir-emtricitabine-tenofovir AF (BIKTARVY) 50-200-25 MG per tablet 1 tablet  1 tablet Oral Daily Jettie Booze, MD   1 tablet at 12/21/19 0640  . calcitRIOL (ROCALTROL) capsule 1 mcg  1 mcg Oral Q M,W,F-HD Valentina Gu, NP   1 mcg at 12/20/19 1647  . colchicine tablet 0.6 mg  0.6 mg Oral Daily PRN Jettie Booze, MD      . diclofenac Sodium (VOLTAREN) 1 % topical gel 2 g  2 g Topical TID PRN Marcie Mowers, MD   2 g at 12/20/19 2322  . docusate sodium (COLACE) capsule 100 mg  100 mg Oral Daily PRN Jettie Booze, MD      . fenofibrate tablet 160 mg  160 mg Oral Daily Jettie Booze, MD   160 mg at 12/21/19 9811  . gabapentin (NEURONTIN) capsule 300 mg  300 mg Oral Daily Jettie Booze, MD   300 mg at 12/21/19 9147  . insulin aspart (novoLOG) injection 0-15 Units  0-15 Units Subcutaneous TID WC Kroeger, Krista M., PA-C   3 Units at 12/20/19 1003  . insulin aspart (novoLOG) injection 0-5 Units  0-5 Units Subcutaneous QHS Kroeger, Krista M., PA-C      . isosorbide mononitrate (IMDUR) 24 hr tablet 30 mg  30 mg Oral Daily Jettie Booze, MD   30 mg at 12/21/19 8295  . latanoprost (XALATAN) 0.005 % ophthalmic solution 1 drop  1 drop Both Eyes QHS Jettie Booze, MD   1 drop at 12/20/19 2203  . Melatonin TABS 3 mg  3 mg Oral QHS Jettie Booze, MD   3 mg at 12/19/19 2153  . multivitamin (RENA-VIT) tablet 1 tablet  1 tablet Oral QHS Jettie Booze, MD   1  tablet at 12/21/19 6213  . nitroGLYCERIN (NITROSTAT) SL tablet 0.4 mg  0.4 mg Sublingual Q5 min PRN Jettie Booze, MD   0.4 mg at 12/19/19 1548  . ondansetron (ZOFRAN) injection 4 mg  4 mg Intravenous Q6H PRN Jettie Booze, MD   4 mg at 12/19/19 1301  . pantoprazole (PROTONIX) EC tablet 40 mg  40 mg Oral Daily Jettie Booze, MD   40 mg at 12/21/19 0615  . polyethylene glycol (MIRALAX / GLYCOLAX) packet 17 g  17 g Oral Daily PRN Jettie Booze, MD      . sevelamer carbonate (RENVELA) tablet 800 mg  800 mg Oral TID WC Jettie Booze, MD   800 mg at 12/21/19 0865  . simethicone (MYLICON) chewable tablet 80 mg  80 mg Oral QID PRN Jettie Booze, MD      . sodium chloride flush (NS) 0.9 % injection 3 mL  3 mL Intravenous Q12H Jettie Booze, MD   3 mL at 12/20/19 2204  . sodium chloride flush (NS) 0.9 % injection 3 mL  3 mL Intravenous PRN Jettie Booze, MD      . ticagrelor [facility]) tablet 90 mg  90 mg Oral BID Jettie Booze, MD   90 mg at 12/21/19 0849  . timolol (TIMOPTIC) 0.5 % ophthalmic solution 1 drop  1 drop Both Eyes Daily Jettie Booze, MD   1 drop at 12/21/19 0850  . umeclidinium-vilanterol (ANORO ELLIPTA)  62.5-25 MCG/INH 1 puff  1 puff Inhalation Daily Jettie Booze, MD   1 puff at 12/21/19 3343    Allergies as of 12/12/2019 - Review Complete 12/12/2019  Allergen Reaction Noted  . Augmentin [amoxicillin-pot clavulanate] Diarrhea 12/05/2018  . Amphetamines Other (See Comments) 01/15/2012    Family History  Problem Relation Age of Onset  . Heart failure Father   . Hypertension Father   . Diabetes Brother   . Heart attack Brother   . Alzheimer's disease Mother   . Stroke Sister   . Diabetes Sister   . Alzheimer's disease Sister   . Hypertension Brother   . Diabetes Brother   . Drug abuse Brother   . Colon cancer Neg Hx     Social History   Socioeconomic History  . Marital status: Single    Spouse  name: Not on file  . Number of children: 1  . Years of education: Not on file  . Highest education level: Not on file  Occupational History  . Occupation: retired  Tobacco Use  . Smoking status: Current Every Day Smoker    Packs/day: 0.50    Years: 43.00    Pack years: 21.50    Types: Cigarettes  . Smokeless tobacco: Never Used  Substance and Sexual Activity  . Alcohol use: Not Currently    Alcohol/week: 12.0 standard drinks    Types: 12 Standard drinks or equivalent per week  . Drug use: No    Types: Cocaine    Comment: hx of crack/cocaine 92yrs ago 10/01/2019- none  . Sexual activity: Not Currently  Other Topics Concern  . Not on file  Social History Narrative   Retired.       As of 05/2015:   Diet: No salt    Caffeine   Married: Single    House: Condo, 2 stories, 1 person (self)    Pets: No    Current/Past profession: N/A   Exercise: walks daily    Living Will: Yes    DNR   POA/HPOA: No       Social Determinants of Radio broadcast assistant Strain:   . Difficulty of Paying Living Expenses: Not on file  Food Insecurity:   . Worried About Charity fundraiser in the Last Year: Not on file  . Ran Out of Food in the Last Year: Not on file  Transportation Needs:   . Lack of Transportation (Medical): Not on file  . Lack of Transportation (Non-Medical): Not on file  Physical Activity:   . Days of Exercise per Week: Not on file  . Minutes of Exercise per Session: Not on file  Stress:   . Feeling of Stress : Not on file  Social Connections:   . Frequency of Communication with Friends and Family: Not on file  . Frequency of Social Gatherings with Friends and Family: Not on file  . Attends Religious Services: Not on file  . Active Member of Clubs or Organizations: Not on file  . Attends Archivist Meetings: Not on file  . Marital Status: Not on file  Intimate Partner Violence:   . Fear of Current or Ex-Partner: Not on file  . Emotionally Abused: Not on  file  . Physically Abused: Not on file  . Sexually Abused: Not on file    Review of Systems: Pertinent positive and negative review of systems were noted in the above HPI section.  All other review of systems was otherwise negative.[name]  Physical  Exam: Vital signs in last 24 hours: Temp:  [97.5 F (36.4 C)-97.9 F (36.6 C)] 97.5 F (36.4 C) (02/27 0401) Pulse Rate:  [66-94] 80 (02/27 0401) Resp:  [15-20] 19 (02/27 0401) BP: (107-142)/(56-83) 123/60 (02/27 0401) SpO2:  [96 %-100 %] 99 % (02/27 0855) Weight:  [81.1 kg-81.6 kg] 81.5 kg (02/27 0401) Last BM Date: 12/20/19 General:   Alert,  Well-developed, well-nourished, chronically ill-appearing older African-American male cooperative in NAD Head:  Normocephalic and atraumatic. Eyes:  Sclera clear, no icterus.   Conjunctiva pale Ears:  Normal auditory acuity. Nose:  No deformity, discharge,  or lesions. Mouth:  No deformity or lesions.   Neck:  Supple; no masses or thyromegaly. Lungs:  Clear throughout to auscultation.   No wheezes, crackles, or rhonchi. Heart:  Regular rate and rhythm; no murmurs, clicks, rubs,  or gallops. Abdomen:  Soft,nontender, BS active,nonpalp mass or hsm.   Rectal:  Deferred  Msk:  Symmetrical without gross deformities. . Pulses:  Normal pulses noted. Extremities:  Without clubbing or edema. Neurologic:  Alert and  oriented x4;  grossly normal neurologically. Skin:  Intact without significant lesions or rashes.. Psych:  Alert and cooperative. Normal mood and affect.  Intake/Output from previous day: 02/26 0701 - 02/27 0700 In: 630 [Blood:630] Out: 1500  Intake/Output this shift: No intake/output data recorded.  Lab Results: Recent Labs    12/20/19 0311 12/20/19 0628 12/21/19 0352  WBC 4.7 4.8 5.6  HGB 7.0* 7.3* 9.0*  HCT 22.5* 23.1* 28.3*  PLT 142* 147* 158   BMET Recent Labs    12/19/19 0547 12/19/19 0814 12/19/19 0817 12/20/19 0311 12/21/19 0352  NA 136   < > 137 139 138  K 5.1    < > 3.0* 3.6 3.8  CL 94*  --   --  99 99  CO2 29  --   --  28 26  GLUCOSE 133*  --   --  133* 83  BUN 34*  --   --  76* 37*  CREATININE 5.10*  --   --  5.94* 4.47*  CALCIUM 8.8*  --   --  8.4* 8.9   < > = values in this interval not displayed.   LFT No results for input(s): PROT, ALBUMIN, AST, ALT, ALKPHOS, BILITOT, BILIDIR, IBILI in the last 72 hours. PT/INR No results for input(s): LABPROT, INR in the last 72 hours. Hepatitis Panel No results for input(s): HEPBSAG, HCVAB, HEPAIGM, HEPBIGM in the last 72 hours.    IMPRESSION:  #47 69 year old African-American male who we are asked to see for melena and drop in hemoglobin in setting of aspirin and Brilinta. Patient has history of gastric and duodenal AVMs on EGD April 2020, treated with APC at that time.  Suspect current bleeding is secondary to AVMs, cannot rule out peptic ulcer disease.  #2 acute on chronic anemia #3 coronary artery disease status post remote CABG x4-patient had cardiac cath 12/19/2019 with drug-eluting stent placed to SVG-RAM-and was started on dual therapy with aspirin and Brilinta  #4 end-stage renal disease on dialysis #5 congestive heart failure 6.  COPD 7.  HIV 8.  Prior history of substance abuse #9 diverticulosis #10 remote history of polyps  Plan; patient is scheduled for upper endoscopy with Dr. Bryan Lemma tomorrow morning.  Endoscopic therapy options are very limited in the setting of aspirin and Brilinta, and if bleeding found secondary to AVMs, will be unlikely to offer him treatment at this time.  If he has a high  risk lesion/ulcer etc. this may be able to be clipped.  Allow diet today, n.p.o. after midnight Start every 6 hour hemoglobins and transfuse for hemoglobin less than 7 IV PPI twice daily.  Thank you, will follow with you    Gabbrielle Mcnicholas PA-C 12/21/2019, 11:58 AM

## 2019-12-21 NOTE — H&P (View-Only) (Signed)
Consultation  Referring Provider: Cardiology/Dr. Radford Pax  primary Care Physician:  [name], NP Primary Gastroenterologist:  .  Dr. Henrene Pastor  Reason for Consultation: GI bleed  HPI: Jacob [name] is a 69 y.o. male, with history of multiple serious medical problems including coronary artery disease for which she underwent CABG x4 in 2004, congestive heart failure, COPD, end-stage renal disease on dialysis and HIV. Patient underwent outpatient cardiac cath on 12/19/2019 and required PCI/DES x1 to SVG-ramus.  Now on aspirin and Brilinta.  He was admitted post cath with complaints of chest pain and some shortness of breath.  Yesterday was noted to have drop in hemoglobin from 9.5-7.3 and had complained of dark stools.  Today hemoglobin back up to 9 transfusion of 1 unit.  Patient says he had noticed dark stools for a few days last week but this had stopped prior to catheterization.  Black stools recurred post cath and have persisted.  He denies any abdominal pain, no nausea or vomiting.  He had 2 tarry stools today.  He is complaining of severe burning type pain in his feet.  Patient is established with Dr. Henrene Pastor, last colonoscopy December 2013 with removal of 1 2 mm polyp noted to have moderate sigmoid diverticulosis.  He had undergone EGD in April 2020 while hospitalized per Dr. Ardis Hughs for melena and anemia and was found to have a few angio ectasia in the stomach and duodenal bulb disease 4-5 (total).  These were treated with APC. His baseline hemoglobin is in the 8-9 range   Past Medical History:  Diagnosis Date  . Acute respiratory failure ([place]) 03/01/2018  . Arthritis    "all over; mostly knees and back" (02/28/2018)  . CHF (congestive heart failure) (H[facility])    not on any meds  . Chronic lower back pain    stenosis  . Community acquired pneumonia 09/06/2013  . COPD (chronic obstructive pulmonary disease) ([place])   . Coronary atherosclerosis of native coronary artery 2005   s/p  surgery  . Drug abuse ([place])    hx; tested for cocaine as recently as 2/08. says he is not using drugs now - avoided defib. for this reason   . ESRD (end stage renal disease) ([place])    Hemo M-W-F- Richarda Blade  . GERD (gastroesophageal reflux disease)    takes OTC meds as needed  . GI bleeding 02/06/2019  . Glaucoma    uses eye drops daily  . Hepatitis B 1968   "tx'd w/isolation; caught it from toilet stools in gym"  . History of blood transfusion 03/01/2019  . History of colon polyps    benign  . History of gout    takes Allopurinol daily as well as Colchicine-if needed (02/28/2018)  . History of kidney stones   . HTN (hypertension)    takes Coreg,Imdur.and Apresoline daily  . Human immunodeficiency virus (HIV) disease ([place]) dx'd 1995   takes Genvoya daily  . Hyperlipidemia    takes Atorvastatin daily  . Ischemic cardiomyopathy   . Muscle spasm    takes Zanaflex as needed  . Myocardial infarction ([place]) ~ 2004/2005  . Nocturia   . Peripheral neuropathy    takes gabapentin daily  . Pneumonia    "at least twice" (02/28/2018)  . Shortness of breath dyspnea    rarely but if notices it then with exertion  . Syphilis, unspecified   . Type II diabetes mellitus ([place]) 2004   Lantus daily.Average fasting blood sugar 125-199  . Wears glasses   .  Wears partial dentures     Past Surgical History:  Procedure Laterality Date  . AV FISTULA PLACEMENT Left 08/02/2018   Procedure: ARTERIOVENOUS (AV) FISTULA CREATION  left arm radiocephlic;  Surgeon: Marty Heck, MD;  Location: [facility];  Service: Vascular;  Laterality: Left;  . AV FISTULA PLACEMENT Left 08/01/2019   Procedure: LEFT BRACHIOCEPHALIC ARTERIOVENOUS (AV) FISTULA CREATION;  Surgeon: [name], MD;  Location: [facility];  Service: Vascular;  Laterality: Left;  . BASCILIC VEIN TRANSPOSITION Left 10/03/2019   Procedure: BASILIC VEIN TRANSPOSITION LEFT SECOND STAGE;  Surgeon: [name], MD;  Location: [facility];  Service: Vascular;   Laterality: Left;  . CARDIAC CATHETERIZATION  10/2002; 12/19/2004   [name] 03/08/2011  . COLONOSCOPY  2013   Dr.John Henrene Pastor   . CORONARY ARTERY BYPASS GRAFT  02/24/2003   CABG X2/notes 03/08/2011  . CORONARY STENT INTERVENTION N/A 12/19/2019   Procedure: CORONARY STENT INTERVENTION;  Surgeon: Jettie Booze, MD;  Location: [facility];  Service: Cardiovascular;  Laterality: N/A;  . ESOPHAGOGASTRODUODENOSCOPY (EGD) WITH PROPOFOL N/A 02/08/2019   Procedure: ESOPHAGOGASTRODUODENOSCOPY (EGD) WITH PROPOFOL;  Surgeon: Milus Banister, MD;  Location: [facility];  Service: Gastroenterology;  Laterality: N/A;  . HOT HEMOSTASIS N/A 02/08/2019   Procedure: HOT HEMOSTASIS (ARGON PLASMA COAGULATION/BICAP);  Surgeon: Milus Banister, MD;  Location: [facility];  Service: Gastroenterology;  Laterality: N/A;  . INTERTROCHANTERIC HIP FRACTURE SURGERY Left 11/2006   [name] 03/08/2011  . INTRAVASCULAR ULTRASOUND/IVUS N/A 12/19/2019   Procedure: Intravascular Ultrasound/IVUS;  Surgeon: Jettie Booze, MD;  Location: [facility];  Service: Cardiovascular;  Laterality: N/A;  . IR FLUORO GUIDE CV LINE RIGHT  07/24/2019  . IR FLUORO GUIDE CV LINE RIGHT  07/30/2019  . IR US GUIDE VASC ACCESS RIGHT  07/24/2019  . IR US GUIDE VASC ACCESS RIGHT  07/30/2019  . LAPAROSCOPIC CHOLECYSTECTOMY  05/2006  . LIGATION OF COMPETING BRANCHES OF ARTERIOVENOUS FISTULA Left 11/05/2018   Procedure: LIGATION OF COMPETING BRANCHES OF ARTERIOVENOUS FISTULA  LEFT  ARM;  Surgeon: Marty Heck, MD;  Location: [facility];  Service: Vascular;  Laterality: Left;  . LUMBAR LAMINECTOMY/DECOMPRESSION MICRODISCECTOMY N/A 02/29/2016   Procedure: Left L4-5 Lateral Recess Decompression, Removal Extradural Intraspinal Facet Cyst;  Surgeon: Marybelle Killings, MD;  Location: [facility];  Service: Orthopedics;  Laterality: N/A;  . MULTIPLE TOOTH EXTRACTIONS    . ORIF MANDIBULAR FRACTURE Left 08/13/2004   ORIF of left body fracture mandible  with KLS Martin 2.3-mm six hole/notes 03/08/2011  . RIGHT/LEFT HEART CATH AND CORONARY/GRAFT ANGIOGRAPHY N/A 12/19/2019   Procedure: RIGHT/LEFT HEART CATH AND CORONARY/GRAFT ANGIOGRAPHY;  Surgeon: Jettie Booze, MD;  Location: [facility];  Service: Cardiovascular;  Laterality: N/A;    Prior to Admission medications   Medication Sig Start Date End Date Taking? Authorizing Provider  acetaminophen (TYLENOL) 500 MG tablet Take 1,000 mg by mouth every 6 (six) hours as needed for moderate pain.    Yes [provider]  allopurinol (ZYLOPRIM) 100 MG tablet TAKE 1 TABLET(100 MG) BY MOUTH DAILY 07/15/19  Yes Eubanks, [name], NP  ANORO ELLIPTA 62.5-25 MCG/INH AEPB INHALE 1 PUFF BY MOUTH EVERY DAY 12/11/19  Yes [name], Dinah C, NP  aspirin EC 81 MG tablet Take 81 mg by mouth daily.   Yes [provider]  atorvastatin (LIPITOR) 80 MG tablet Take 1 tablet (80 mg total) by mouth daily. 12/05/18  Yes [name], NP  BIKTARVY 50-200-25 MG TABS  tablet TAKE 1 TABLET BY MOUTH DAILY 07/12/19  Yes [name], MD  Choline Fenofibrate (FENOFIBRIC ACID) 135 MG CPDR TAKE 1 CAPSULE BY MOUTH DAILY 09/23/19  Yes [name], NP  diclofenac Sodium (VOLTAREN) 1 % GEL Apply 2 g topically 3 (three) times daily as needed (pain).   Yes [provider]  docusate sodium (COLACE) 100 MG capsule Take 100 mg by mouth daily as needed for mild constipation or moderate constipation.   Yes [provider]  gabapentin (NEURONTIN) 300 MG capsule Take 1 capsule (300 mg total) by mouth daily. 10/29/19  Yes [name], NP  insulin lispro (HUMALOG) 100 UNIT/ML injection Inject 20 Units into the skin 3 (three) times daily as needed for high blood sugar (above 150).    Yes [provider]  isosorbide mononitrate (IMDUR) 30 MG 24 hr tablet TAKE 1 TABLET(30 MG) BY MOUTH DAILY 08/09/19  Yes Eubanks, [name], NP  latanoprost (XALATAN) 0.005 % ophthalmic solution Place  1 drop into both eyes at bedtime.   Yes [provider]  Melatonin 3 MG TABS Take 1 tablet (3 mg total) by mouth at bedtime. 11/19/19  Yes [name], Dinah C, NP  multivitamin (RENA-VIT) TABS tablet Take 1 tablet by mouth daily.   Yes [provider]  nitroGLYCERIN (NITROSTAT) 0.3 MG SL tablet ONE TABLET UNDER TONGUE AS NEEDED FOR CHEST PAIN 05/01/19  Yes [name], MD  pantoprazole (PROTONIX) 40 MG tablet TAKE 1 TABLET(40 MG) BY MOUTH DAILY 12/11/19  Yes [name], Dinah C, NP  polyethylene glycol (MIRALAX / GLYCOLAX) 17 g packet Take 17 g by mouth daily as needed for moderate constipation.   Yes [provider]  sevelamer carbonate (RENVELA) 800 MG tablet Take 800 mg by mouth 3 (three) times daily with meals.   Yes [provider]  simethicone (MYLICON) 80 MG chewable tablet Chew 1 tablet (80 mg total) by mouth 4 (four) times daily as needed for flatulence. 08/16/19  Yes [name], NP  timolol (TIMOPTIC) 0.5 % ophthalmic solution Place 1 drop into both eyes every morning. 04/04/19  Yes [provider]  B-D UF III MINI PEN NEEDLES 31G X 5 MM MISC USE FOUR TIMES DAILY 02/25/19   [name], NP  colchicine 0.6 MG tablet Take 0.6 mg by mouth daily as needed (pain).    [provider]  glucose blood test strip Use to test blood sugar three times daily. E11.40 04/15/19   [name], NP  Lancets [facility] DELICA PLUS [id number]) MISC Inject 1 Device as directed 3 (three) times daily. Dx: E11.40 09/18/19   [name], NP  ticagrelor (BRILINTA) 90 MG TABS tablet Take 1 tablet (90 mg total) by mouth 2 (two) times daily. 12/20/19   Cheryln Manly, NP    Current Facility-Administered Medications  Medication Dose Route Frequency Provider Last Rate Last Admin  . 0.9 %  sodium chloride infusion (Manually program via Guardrails IV Fluids)   Intravenous Once Valentina Gu, NP      . 0.9 %  sodium chloride infusion  250 mL  Intravenous PRN Jettie Booze, MD      . acetaminophen (TYLENOL) tablet 650 mg  650 mg Oral Q4H PRN Jettie Booze, MD   650 mg at 12/21/19 0201  . allopurinol (ZYLOPRIM) tablet 100 mg  100 mg Oral Daily Jettie Booze, MD   100 mg at 12/21/19 0615  . aspirin chewable tablet 81 mg  81 mg Oral Daily Jettie Booze, MD   81 mg at 12/21/19 6861  . atorvastatin (LIPITOR) tablet 80 mg  80 mg Oral q1800 Jettie Booze, MD   80 mg at 12/19/19 1633  . bictegravir-emtricitabine-tenofovir AF (BIKTARVY) 50-200-25 MG per tablet 1 tablet  1 tablet Oral Daily Jettie Booze, MD   1 tablet at 12/21/19 0640  . calcitRIOL (ROCALTROL) capsule 1 mcg  1 mcg Oral Q M,W,F-HD Valentina Gu, NP   1 mcg at 12/20/19 1647  . colchicine tablet 0.6 mg  0.6 mg Oral Daily PRN Jettie Booze, MD      . diclofenac Sodium (VOLTAREN) 1 % topical gel 2 g  2 g Topical TID PRN Marcie Mowers, MD   2 g at 12/20/19 2322  . docusate sodium (COLACE) capsule 100 mg  100 mg Oral Daily PRN Jettie Booze, MD      . fenofibrate tablet 160 mg  160 mg Oral Daily Jettie Booze, MD   160 mg at 12/21/19 6837  . gabapentin (NEURONTIN) capsule 300 mg  300 mg Oral Daily Jettie Booze, MD   300 mg at 12/21/19 2902  . insulin aspart (novoLOG) injection 0-15 Units  0-15 Units Subcutaneous TID WC Kroeger, Krista M., PA-C   3 Units at 12/20/19 1003  . insulin aspart (novoLOG) injection 0-5 Units  0-5 Units Subcutaneous QHS Kroeger, Krista M., PA-C      . isosorbide mononitrate (IMDUR) 24 hr tablet 30 mg  30 mg Oral Daily Jettie Booze, MD   30 mg at 12/21/19 1115  . latanoprost (XALATAN) 0.005 % ophthalmic solution 1 drop  1 drop Both Eyes QHS Jettie Booze, MD   1 drop at 12/20/19 2203  . Melatonin TABS 3 mg  3 mg Oral QHS Jettie Booze, MD   3 mg at 12/19/19 2153  . multivitamin (RENA-VIT) tablet 1 tablet  1 tablet Oral QHS Jettie Booze, MD   1  tablet at 12/21/19 5208  . nitroGLYCERIN (NITROSTAT) SL tablet 0.4 mg  0.4 mg Sublingual Q5 min PRN Jettie Booze, MD   0.4 mg at 12/19/19 1548  . ondansetron (ZOFRAN) injection 4 mg  4 mg Intravenous Q6H PRN Jettie Booze, MD   4 mg at 12/19/19 1301  . pantoprazole (PROTONIX) EC tablet 40 mg  40 mg Oral Daily Jettie Booze, MD   40 mg at 12/21/19 0615  . polyethylene glycol (MIRALAX / GLYCOLAX) packet 17 g  17 g Oral Daily PRN Jettie Booze, MD      . sevelamer carbonate (RENVELA) tablet 800 mg  800 mg Oral TID WC Jettie Booze, MD   800 mg at 12/21/19 0223  . simethicone (MYLICON) chewable tablet 80 mg  80 mg Oral QID PRN Jettie Booze, MD      . sodium chloride flush (NS) 0.9 % injection 3 mL  3 mL Intravenous Q12H Jettie Booze, MD   3 mL at 12/20/19 2204  . sodium chloride flush (NS) 0.9 % injection 3 mL  3 mL Intravenous PRN Jettie Booze, MD      . ticagrelor [facility]) tablet 90 mg  90 mg Oral BID Jettie Booze, MD   90 mg at 12/21/19 0849  . timolol (TIMOPTIC) 0.5 % ophthalmic solution 1 drop  1 drop Both Eyes Daily Jettie Booze, MD   1 drop at 12/21/19 0850  . umeclidinium-vilanterol (ANORO ELLIPTA)  62.5-25 MCG/INH 1 puff  1 puff Inhalation Daily Jettie Booze, MD   1 puff at 12/21/19 3500    Allergies as of 12/12/2019 - Review Complete 12/12/2019  Allergen Reaction Noted  . Augmentin [amoxicillin-pot clavulanate] Diarrhea 12/05/2018  . Amphetamines Other (See Comments) 01/15/2012    Family History  Problem Relation Age of Onset  . Heart failure Father   . Hypertension Father   . Diabetes Brother   . Heart attack Brother   . Alzheimer's disease Mother   . Stroke Sister   . Diabetes Sister   . Alzheimer's disease Sister   . Hypertension Brother   . Diabetes Brother   . Drug abuse Brother   . Colon cancer Neg Hx     Social History   Socioeconomic History  . Marital status: Single    Spouse  name: Not on file  . Number of children: 1  . Years of education: Not on file  . Highest education level: Not on file  Occupational History  . Occupation: retired  Tobacco Use  . Smoking status: Current Every Day Smoker    Packs/day: 0.50    Years: 43.00    Pack years: 21.50    Types: Cigarettes  . Smokeless tobacco: Never Used  Substance and Sexual Activity  . Alcohol use: Not Currently    Alcohol/week: 12.0 standard drinks    Types: 12 Standard drinks or equivalent per week  . Drug use: No    Types: Cocaine    Comment: hx of crack/cocaine 74yrs ago 10/01/2019- none  . Sexual activity: Not Currently  Other Topics Concern  . Not on file  Social History Narrative   Retired.       As of 05/2015:   Diet: No salt    Caffeine   Married: Single    House: Condo, 2 stories, 1 person (self)    Pets: No    Current/Past profession: N/A   Exercise: walks daily    Living Will: Yes    DNR   POA/HPOA: No       Social Determinants of Radio broadcast assistant Strain:   . Difficulty of Paying Living Expenses: Not on file  Food Insecurity:   . Worried About Charity fundraiser in the Last Year: Not on file  . Ran Out of Food in the Last Year: Not on file  Transportation Needs:   . Lack of Transportation (Medical): Not on file  . Lack of Transportation (Non-Medical): Not on file  Physical Activity:   . Days of Exercise per Week: Not on file  . Minutes of Exercise per Session: Not on file  Stress:   . Feeling of Stress : Not on file  Social Connections:   . Frequency of Communication with Friends and Family: Not on file  . Frequency of Social Gatherings with Friends and Family: Not on file  . Attends Religious Services: Not on file  . Active Member of Clubs or Organizations: Not on file  . Attends Archivist Meetings: Not on file  . Marital Status: Not on file  Intimate Partner Violence:   . Fear of Current or Ex-Partner: Not on file  . Emotionally Abused: Not on  file  . Physically Abused: Not on file  . Sexually Abused: Not on file    Review of Systems: Pertinent positive and negative review of systems were noted in the above HPI section.  All other review of systems was otherwise negative.[name]  Physical  Exam: Vital signs in last 24 hours: Temp:  [97.5 F (36.4 C)-97.9 F (36.6 C)] 97.5 F (36.4 C) (02/27 0401) Pulse Rate:  [66-94] 80 (02/27 0401) Resp:  [15-20] 19 (02/27 0401) BP: (107-142)/(56-83) 123/60 (02/27 0401) SpO2:  [96 %-100 %] 99 % (02/27 0855) Weight:  [81.1 kg-81.6 kg] 81.5 kg (02/27 0401) Last BM Date: 12/20/19 General:   Alert,  Well-developed, well-nourished, chronically ill-appearing older African-American male cooperative in NAD Head:  Normocephalic and atraumatic. Eyes:  Sclera clear, no icterus.   Conjunctiva pale Ears:  Normal auditory acuity. Nose:  No deformity, discharge,  or lesions. Mouth:  No deformity or lesions.   Neck:  Supple; no masses or thyromegaly. Lungs:  Clear throughout to auscultation.   No wheezes, crackles, or rhonchi. Heart:  Regular rate and rhythm; no murmurs, clicks, rubs,  or gallops. Abdomen:  Soft,nontender, BS active,nonpalp mass or hsm.   Rectal:  Deferred  Msk:  Symmetrical without gross deformities. . Pulses:  Normal pulses noted. Extremities:  Without clubbing or edema. Neurologic:  Alert and  oriented x4;  grossly normal neurologically. Skin:  Intact without significant lesions or rashes.. Psych:  Alert and cooperative. Normal mood and affect.  Intake/Output from previous day: 02/26 0701 - 02/27 0700 In: 630 [Blood:630] Out: 1500  Intake/Output this shift: No intake/output data recorded.  Lab Results: Recent Labs    12/20/19 0311 12/20/19 0628 12/21/19 0352  WBC 4.7 4.8 5.6  HGB 7.0* 7.3* 9.0*  HCT 22.5* 23.1* 28.3*  PLT 142* 147* 158   BMET Recent Labs    12/19/19 0547 12/19/19 0814 12/19/19 0817 12/20/19 0311 12/21/19 0352  NA 136   < > 137 139 138  K 5.1    < > 3.0* 3.6 3.8  CL 94*  --   --  99 99  CO2 29  --   --  28 26  GLUCOSE 133*  --   --  133* 83  BUN 34*  --   --  76* 37*  CREATININE 5.10*  --   --  5.94* 4.47*  CALCIUM 8.8*  --   --  8.4* 8.9   < > = values in this interval not displayed.   LFT No results for input(s): PROT, ALBUMIN, AST, ALT, ALKPHOS, BILITOT, BILIDIR, IBILI in the last 72 hours. PT/INR No results for input(s): LABPROT, INR in the last 72 hours. Hepatitis Panel No results for input(s): HEPBSAG, HCVAB, HEPAIGM, HEPBIGM in the last 72 hours.    IMPRESSION:  #21 69 year old African-American male who we are asked to see for melena and drop in hemoglobin in setting of aspirin and Brilinta. Patient has history of gastric and duodenal AVMs on EGD April 2020, treated with APC at that time.  Suspect current bleeding is secondary to AVMs, cannot rule out peptic ulcer disease.  #2 acute on chronic anemia #3 coronary artery disease status post remote CABG x4-patient had cardiac cath 12/19/2019 with drug-eluting stent placed to SVG-RAM-and was started on dual therapy with aspirin and Brilinta  #4 end-stage renal disease on dialysis #5 congestive heart failure 6.  COPD 7.  HIV 8.  Prior history of substance abuse #9 diverticulosis #10 remote history of polyps  Plan; patient is scheduled for upper endoscopy with Dr. Bryan Lemma tomorrow morning.  Endoscopic therapy options are very limited in the setting of aspirin and Brilinta, and if bleeding found secondary to AVMs, will be unlikely to offer him treatment at this time.  If he has a high  risk lesion/ulcer etc. this may be able to be clipped.  Allow diet today, n.p.o. after midnight Start every 6 hour hemoglobins and transfuse for hemoglobin less than 7 IV PPI twice daily.  Thank you, will follow with you      PA-C 12/21/2019, 11:58 AM

## 2019-12-21 NOTE — Progress Notes (Addendum)
[facility] Progress Note   Subjective:  Seen in room - denies CP, dyspnea, abd pain. Was supposed to be discharged yesterday but then postponed after bloody stools. Reports "black as my shoe" stools overnight. GI consult pending.  Objective Vitals:   12/20/19 1755 12/20/19 2025 12/21/19 0401 12/21/19 0855  BP: 126/67 (!) 142/83 123/60   Pulse: 84 94 80   Resp: 17 20 19    Temp: (!) 97.5 F (36.4 C) 97.6 F (36.4 C) (!) 97.5 F (36.4 C)   TempSrc: Oral Oral Oral   SpO2: 96% 100% 100% 99%  Weight: 81.1 kg  81.5 kg   Height:       Physical Exam General: Elderly man, NAD Heart: RRR; no murmur Lungs: CTAB Abdomen: soft, non-tender Extremities: No LE edema Dialysis Access: L AVF + thrill  Additional Objective Labs: Basic Metabolic Panel: Recent Labs  Lab 12/19/19 0547 12/19/19 0814 12/19/19 0817 12/20/19 0311 12/21/19 0352  NA 136   < > 137 139 138  K 5.1   < > 3.0* 3.6 3.8  CL 94*  --   --  99 99  CO2 29  --   --  28 26  GLUCOSE 133*  --   --  133* 83  BUN 34*  --   --  76* 37*  CREATININE 5.10*  --   --  5.94* 4.47*  CALCIUM 8.8*  --   --  8.4* 8.9   < > = values in this interval not displayed.   CBC: Recent Labs  Lab 12/17/19 1042 12/19/19 0547 12/19/19 0814 12/20/19 0311 12/20/19 0628 12/21/19 0352  WBC 4.5 3.5*   < > 4.7 4.8 5.6  NEUTROABS 2.8  --   --   --   --   --   HGB 8.4* 9.3*   < > 7.0* 7.3* 9.0*  HCT 26.0* 29.7*   < > 22.5* 23.1* 28.3*  MCV 81 86.8  --  87.9 86.5 87.9  PLT 145* 149*   < > 142* 147* 158   < > = values in this interval not displayed.   CBG: Recent Labs  Lab 12/19/19 2101 12/20/19 0746 12/20/19 1058 12/20/19 2108 12/21/19 0647  GLUCAP 140* 180* 152* 162* 97   Studies/Results: VAS [place] LOWER EXTREMITY ARTERIAL DUPLEX  Result Date: 12/20/2019 LOWER EXTREMITY ARTERIAL DUPLEX STUDY Indications: Post op cath. High Risk Factors: Hypertension, hyperlipidemia, coronary artery disease.  Current ABI: Comparison  Study: No prior studies. Performing Technologist: Oliver Hum RVT  Examination Guidelines: A complete evaluation includes B-mode imaging, spectral Doppler, color Doppler, and power Doppler as needed of all accessible portions of each vessel. Bilateral testing is considered an integral part of a complete examination. Limited examinations for reoccurring indications may be performed as noted.  +--------+--------+-----+--------+---------+--------+ RIGHT   PSV cm/sRatioStenosisWaveform Comments +--------+--------+-----+--------+---------+--------+ CFA Mid                      triphasic         +--------+--------+-----+--------+---------+--------+ DFA                          triphasic         +--------+--------+-----+--------+---------+--------+ SFA Prox                     triphasic         +--------+--------+-----+--------+---------+--------+  Summary: Right: There is no evidence of pseudoaneurysm.  See table(s) above for  measurements and observations. Electronically signed by Servando Snare MD on 12/20/2019 at 3:24:37 PM.    Final    Medications: . sodium chloride     . sodium chloride   Intravenous Once  . allopurinol  100 mg Oral Daily  . aspirin  81 mg Oral Daily  . atorvastatin  80 mg Oral q1800  . bictegravir-emtricitabine-tenofovir AF  1 tablet Oral Daily  . calcitRIOL  1 mcg Oral Q M,W,F-HD  . fenofibrate  160 mg Oral Daily  . gabapentin  300 mg Oral Daily  . insulin aspart  0-15 Units Subcutaneous TID WC  . insulin aspart  0-5 Units Subcutaneous QHS  . isosorbide mononitrate  30 mg Oral Daily  . latanoprost  1 drop Both Eyes QHS  . Melatonin  3 mg Oral QHS  . multivitamin  1 tablet Oral QHS  . pantoprazole  40 mg Oral Daily  . sevelamer carbonate  800 mg Oral TID WC  . sodium chloride flush  3 mL Intravenous Q12H  . ticagrelor  90 mg Oral BID  . timolol  1 drop Both Eyes Daily  . umeclidinium-vilanterol  1 puff Inhalation Daily    Dialysis Orders: [facility]  MWF 4 hrs 180NRe 400/800 83 kg 2.0 K/ 2.0 Ca L AVF No Heparin -Venofer 100 mg IV X 10 (2/10 doses given 12/18/2019) -Calcitriol 1.0 mcg PO TIW -Mircera 200 mcg IV q 2 weeks (last 12/18/2019)  Assessment/Plan: 1.  Chest Pain/DOE: S/p LHC with DES to SVG Ramus. Will be on DAPT for 6 months. 2.  Symptomatic Anemia: Bloody + black stools overnight, no abdominal pain. Has been seen by GI 11/2019  -> had recommended colonoscopy, but needed cardiac procedure first, now done as above #1. Just given ESA  on 2/24. S/p 1U PRBCs on 2/26. GI consult pending. 3.  ESRD: Continue HD per MWF sched - next 3/1, no heparin. 4.  Hypertension/volume: BP stable, UF as tolerated. 5.  Metabolic bone disease: Ca ok, Phos pending. Continue binders, VDRA 6.  Nutrition -renal/Carb mod diet.  7.  DM-per primary 8.  HIV-continue meds per primary 9.  Hepatitis B  Veneta Penton, PA-C 12/21/2019, 10:04 AM  [facility]  Pt seen, examined and agree w A/P as above.  Kelly Splinter  MD 12/21/2019, 1:14 PM

## 2019-12-21 NOTE — Progress Notes (Signed)
Have notified [facility] need for GI consult.   They have seen pt in the past.

## 2019-12-22 ENCOUNTER — Inpatient Hospital Stay (HOSPITAL_COMMUNITY): Payer: Medicare Other | Admitting: Anesthesiology

## 2019-12-22 ENCOUNTER — Encounter (HOSPITAL_COMMUNITY): Payer: Self-pay | Admitting: Interventional Cardiology

## 2019-12-22 ENCOUNTER — Encounter (HOSPITAL_COMMUNITY)
Admission: RE | Disposition: A | Discharge status: Home / Self Care | Payer: Self-pay | Source: Home / Self Care | Attending: Interventional Cardiology

## 2019-12-22 HISTORY — PX: HEMOSTASIS CLIP PLACEMENT: SHX6857

## 2019-12-22 HISTORY — PX: HOT HEMOSTASIS: SHX5433

## 2019-12-22 HISTORY — PX: SCLEROTHERAPY: SHX6841

## 2019-12-22 HISTORY — PX: ESOPHAGOGASTRODUODENOSCOPY (EGD) WITH PROPOFOL: SHX5813

## 2019-12-22 LAB — HEMOGLOBIN AND HEMATOCRIT, BLOOD
HCT: 27.9 % — ABNORMAL LOW (ref 39.0–52.0)
HCT: 26.5 % — ABNORMAL LOW (ref 39.0–52.0)
HCT: 23.8 % — ABNORMAL LOW (ref 39.0–52.0)
Hemoglobin: 8.6 g/dL — ABNORMAL LOW (ref 13.0–17.0)
Hemoglobin: 8.3 g/dL — ABNORMAL LOW (ref 13.0–17.0)
Hemoglobin: 7.5 g/dL — ABNORMAL LOW (ref 13.0–17.0)

## 2019-12-22 LAB — GLUCOSE, CAPILLARY
Glucose-Capillary: 114 mg/dL — ABNORMAL HIGH (ref 70–99)
Glucose-Capillary: 149 mg/dL — ABNORMAL HIGH (ref 70–99)
Glucose-Capillary: 106 mg/dL — ABNORMAL HIGH (ref 70–99)
Glucose-Capillary: 175 mg/dL — ABNORMAL HIGH (ref 70–99)

## 2019-12-22 SURGERY — ESOPHAGOGASTRODUODENOSCOPY (EGD) WITH PROPOFOL
Anesthesia: Monitor Anesthesia Care

## 2019-12-22 MED ORDER — PROPOFOL 500 MG/50ML IV EMUL
INTRAVENOUS | Status: DC | PRN
  Administered 2019-12-22: 100 ug/kg/min via INTRAVENOUS

## 2019-12-22 MED ORDER — PANTOPRAZOLE SODIUM 40 MG PO TBEC
40.0000 mg | DELAYED_RELEASE_TABLET | Freq: Two times a day (BID) | ORAL | Status: DC
  Administered 2019-12-22 – 2019-12-23 (×3): 40 mg via ORAL
  Filled 2019-12-22 (×3): qty 1

## 2019-12-22 MED ORDER — SODIUM CHLORIDE (PF) 0.9 % IJ SOLN
PREFILLED_SYRINGE | INTRAMUSCULAR | Status: DC | PRN
  Administered 2019-12-22: 2 mL

## 2019-12-22 MED ORDER — EPINEPHRINE 1 MG/10ML IJ SOSY
PREFILLED_SYRINGE | INTRAMUSCULAR | Status: AC
  Filled 2019-12-22: qty 10

## 2019-12-22 MED ORDER — SUCRALFATE 1 GM/10ML PO SUSP
1.0000 g | Freq: Two times a day (BID) | ORAL | Status: DC
  Administered 2019-12-22 – 2019-12-23 (×3): 1 g via ORAL
  Filled 2019-12-22 (×3): qty 10

## 2019-12-22 SURGICAL SUPPLY — 15 items

## 2019-12-22 NOTE — Interval H&P Note (Signed)
History and Physical Interval Note:  12/22/2019 8:49 AM  Jacob [name]  has presented today for surgery, with the diagnosis of Melena.  The various methods of treatment have been discussed with the patient and family. After consideration of risks, benefits and other options for treatment, the patient has consented to  Procedure(s): ESOPHAGOGASTRODUODENOSCOPY (EGD) WITH PROPOFOL (N/A) as a surgical intervention.  The patient's history has been reviewed, patient examined, no change in status, stable for surgery.  I have reviewed the patient's chart and labs.  Questions were answered to the patient's satisfaction.     Jacob [name] [name]

## 2019-12-22 NOTE — Progress Notes (Signed)
[facility] Progress Note   Subjective:  Seen in room - no CP/dyspnea. S/p EGD this morning showing 4 AVMs - two of which were bleeding -> all treated.  Objective Vitals:   12/22/19 0645 12/22/19 0843 12/22/19 0945 12/22/19 1000  BP:  (!) 142/59 (!) 106/50 134/64  Pulse:  84 77   Resp:  14 12 16   Temp:  97.9 F (36.6 C) 97.6 F (36.4 C)   TempSrc:  Oral Axillary   SpO2:  98% 100% 100%  Weight: 81.6 kg 81.6 kg    Height:  6\' 2"  (1.88 m)     Physical Exam General: Elderly man, NAD Heart: RRR; no murmur Lungs: CTAB Abdomen: soft, non-tender Extremities: No LE edema Dialysis Access: L AVF + thrill  Additional Objective Labs: Basic Metabolic Panel: Recent Labs  Lab 12/19/19 0547 12/19/19 0814 12/19/19 0817 12/20/19 0311 12/21/19 0352  NA 136   < > 137 139 138  K 5.1   < > 3.0* 3.6 3.8  CL 94*  --   --  99 99  CO2 29  --   --  28 26  GLUCOSE 133*  --   --  133* 83  BUN 34*  --   --  76* 37*  CREATININE 5.10*  --   --  5.94* 4.47*  CALCIUM 8.8*  --   --  8.4* 8.9   < > = values in this interval not displayed.   CBC: Recent Labs  Lab 12/17/19 1042 12/19/19 0547 12/19/19 0814 12/20/19 0311 12/20/19 0311 12/20/19 0628 12/20/19 0628 12/21/19 0352 12/21/19 0352 12/21/19 1327 12/21/19 2118 12/22/19 0523  WBC 4.5 3.5*   < > 4.7  --  4.8  --  5.6  --   --   --   --   NEUTROABS 2.8  --   --   --   --   --   --   --   --   --   --   --   HGB 8.4* 9.3*   < > 7.0*   < > 7.3*   < > 9.0*   < > 8.7* 8.1* 8.6*  HCT 26.0* 29.7*   < > 22.5*   < > 23.1*   < > 28.3*   < > 27.0* 25.2* 27.9*  MCV 81 86.8  --  87.9  --  86.5  --  87.9  --   --   --   --   PLT 145* 149*   < > 142*  --  147*  --  158  --   --   --   --    < > = values in this interval not displayed.   CBG: Recent Labs  Lab 12/21/19 0647 12/21/19 1210 12/21/19 1625 12/21/19 2131 12/22/19 0730  GLUCAP 97 134* 160* 161* 114*   Studies/Results: VAS [place] LOWER EXTREMITY ARTERIAL  DUPLEX  Result Date: 12/20/2019 LOWER EXTREMITY ARTERIAL DUPLEX STUDY Indications: Post op cath. High Risk Factors: Hypertension, hyperlipidemia, coronary artery disease.  Current ABI: Comparison Study: No prior studies. Performing Technologist: Oliver Hum RVT  Examination Guidelines: A complete evaluation includes B-mode imaging, spectral Doppler, color Doppler, and power Doppler as needed of all accessible portions of each vessel. Bilateral testing is considered an integral part of a complete examination. Limited examinations for reoccurring indications may be performed as noted.  +--------+--------+-----+--------+---------+--------+ RIGHT   PSV cm/sRatioStenosisWaveform Comments +--------+--------+-----+--------+---------+--------+ CFA Mid  triphasic         +--------+--------+-----+--------+---------+--------+ DFA                          triphasic         +--------+--------+-----+--------+---------+--------+ SFA Prox                     triphasic         +--------+--------+-----+--------+---------+--------+  Summary: Right: There is no evidence of pseudoaneurysm.  See table(s) above for measurements and observations. Electronically signed by Servando Snare MD on 12/20/2019 at 3:24:37 PM.    Final    Medications: . sodium chloride Stopped (12/22/19 0946)   . sodium chloride   Intravenous Once  . allopurinol  100 mg Oral Daily  . aspirin  81 mg Oral Daily  . atorvastatin  80 mg Oral q1800  . bictegravir-emtricitabine-tenofovir AF  1 tablet Oral Daily  . calcitRIOL  1 mcg Oral Q M,W,F-HD  . fenofibrate  160 mg Oral Daily  . gabapentin  300 mg Oral Daily  . insulin aspart  0-15 Units Subcutaneous TID WC  . insulin aspart  0-5 Units Subcutaneous QHS  . isosorbide mononitrate  30 mg Oral Daily  . latanoprost  1 drop Both Eyes QHS  . Melatonin  3 mg Oral QHS  . multivitamin  1 tablet Oral QHS  . pantoprazole  40 mg Oral BID  . sevelamer carbonate   800 mg Oral TID WC  . sodium chloride flush  3 mL Intravenous Q12H  . sucralfate  1 g Oral BID  . ticagrelor  90 mg Oral BID  . timolol  1 drop Both Eyes Daily  . umeclidinium-vilanterol  1 puff Inhalation Daily    Dialysis Orders: [facility] MWF 4 hrs 180NRe 400/800 83 kg 2.0 K/ 2.0 Ca L AVF No Heparin -Venofer 100 mg IV X 10 (2/10 doses given 12/18/2019) -Calcitriol 1.0 mcg PO TIW -Mircera 200 mcg IV q 2 weeks (last 12/18/2019)  Assessment/Plan: 1. Chest Pain/DOE: S/p LHC with DES to SVG Ramus. Will be on DAPT for 6 months. 2. Symptomatic Anemia: Bloody + black stools - s/p EGD 2/28 showing 4 AVMs -> treated. Just given ESA  on 2/24. S/p 1U PRBCs on 2/26. Follow Hgb. 3. ESRD: Continue HD per MWF sched - next 3/1, no heparin. 4. Hypertension/volume: BP stable, below prior dry weight. 5. Metabolic bone disease: Ca ok, Phos pending. Continue binders, VDRA 6. Nutrition: Continue renal diet. 7. DM: Per primary 8. HIV: Continue meds per primary  Veneta Penton, PA-C 12/22/2019, 10:52 AM  [facility]

## 2019-12-22 NOTE — Progress Notes (Addendum)
Progress Note  Patient Name: SEWARD CORAN Date of Encounter: 12/22/2019  Primary Cardiologist: No primary care provider on file.   Subjective   No chest pain or SOB this am.  NPO for EGD today.  Hbg this am 8.6.    Inpatient Medications    Scheduled Meds: . sodium chloride   Intravenous Once  . allopurinol  100 mg Oral Daily  . aspirin  81 mg Oral Daily  . atorvastatin  80 mg Oral q1800  . bictegravir-emtricitabine-tenofovir AF  1 tablet Oral Daily  . calcitRIOL  1 mcg Oral Q M,W,F-HD  . fenofibrate  160 mg Oral Daily  . gabapentin  300 mg Oral Daily  . insulin aspart  0-15 Units Subcutaneous TID WC  . insulin aspart  0-5 Units Subcutaneous QHS  . isosorbide mononitrate  30 mg Oral Daily  . latanoprost  1 drop Both Eyes QHS  . Melatonin  3 mg Oral QHS  . multivitamin  1 tablet Oral QHS  . pantoprazole (PROTONIX) IV  40 mg Intravenous Q12H  . sevelamer carbonate  800 mg Oral TID WC  . sodium chloride flush  3 mL Intravenous Q12H  . ticagrelor  90 mg Oral BID  . timolol  1 drop Both Eyes Daily  . umeclidinium-vilanterol  1 puff Inhalation Daily   Continuous Infusions: . sodium chloride     PRN Meds: sodium chloride, acetaminophen, colchicine, diclofenac Sodium, docusate sodium, nitroGLYCERIN, ondansetron (ZOFRAN) IV, polyethylene glycol, simethicone, sodium chloride flush   Vital Signs    Vitals:   12/21/19 0401 12/21/19 0855 12/21/19 2144 12/22/19 0645  BP: 123/60  (!) 120/53   Pulse: 80  82   Resp: 19  18   Temp: (!) 97.5 F (36.4 C)  98.3 F (36.8 C)   TempSrc: Oral     SpO2: 100% 99% 98%   Weight: 81.5 kg   81.6 kg  Height:        Intake/Output Summary (Last 24 hours) at 12/22/2019 0735 Last data filed at 12/21/2019 2000 Gross per 24 hour  Intake 240 ml  Output --  Net 240 ml   Last 3 Weights 12/22/2019 12/21/2019 12/20/2019  Weight (lbs) 179 lb 14.4 oz 179 lb 11.2 oz 178 lb 12.7 oz  Weight (kg) 81.602 kg 81.511 kg 81.1 kg      Telemetry     NSR - Personally Reviewed  ECG    No new EKG to review- Personally Reviewed  Physical Exam  GEN: Well nourished, well developed in no acute distress HEENT: Normal NECK: No JVD; No carotid bruits LYMPHATICS: No lymphadenopathy CARDIAC:RRR, no murmurs, rubs, gallops RESPIRATORY:  Clear to auscultation without rales, wheezing or rhonchi  ABDOMEN: Soft, non-tender, non-distended MUSCULOSKELETAL:  No edema; No deformity  SKIN: Warm and dry NEUROLOGIC:  Alert and oriented x 3 PSYCHIATRIC:  Normal affect    Labs    High Sensitivity Troponin:  No results for input(s): TROPONINIHS in the last 720 hours.    Chemistry Recent Labs  Lab 12/19/19 0547 12/19/19 0814 12/19/19 0817 12/20/19 0311 12/21/19 0352  NA 136   < > 137 139 138  K 5.1   < > 3.0* 3.6 3.8  CL 94*  --   --  99 99  CO2 29  --   --  28 26  GLUCOSE 133*  --   --  133* 83  BUN 34*  --   --  76* 37*  CREATININE 5.10*  --   --  5.94* 4.47*  CALCIUM 8.8*  --   --  8.4* 8.9  GFRNONAA 11*  --   --  9* 12*  GFRAA 12*  --   --  10* 14*  ANIONGAP 13  --   --  12 13   < > = values in this interval not displayed.     Hematology Recent Labs  Lab 12/20/19 0311 12/20/19 0311 12/20/19 0628 12/20/19 0628 12/21/19 0352 12/21/19 0352 12/21/19 1327 12/21/19 2118 12/22/19 0523  WBC 4.7  --  4.8  --  5.6  --   --   --   --   RBC 2.56*  --  2.67*  --  3.22*  --   --   --   --   HGB 7.0*   < > 7.3*   < > 9.0*   < > 8.7* 8.1* 8.6*  HCT 22.5*   < > 23.1*   < > 28.3*   < > 27.0* 25.2* 27.9*  MCV 87.9  --  86.5  --  87.9  --   --   --   --   MCH 27.3  --  27.3  --  28.0  --   --   --   --   MCHC 31.1  --  31.6  --  31.8  --   --   --   --   RDW 19.0*  --  19.0*  --  19.2*  --   --   --   --   PLT 142*  --  147*  --  158  --   --   --   --    < > = values in this interval not displayed.    BNPNo results for input(s): BNP, PROBNP in the last 168 hours.   DDimer No results for input(s): DDIMER in the last 168 hours.    Radiology    VAS [place] LOWER EXTREMITY ARTERIAL DUPLEX  Result Date: 12/20/2019 LOWER EXTREMITY ARTERIAL DUPLEX STUDY Indications: Post op cath. High Risk Factors: Hypertension, hyperlipidemia, coronary artery disease.  Current ABI: Comparison Study: No prior studies. Performing Technologist: Oliver Hum RVT  Examination Guidelines: A complete evaluation includes B-mode imaging, spectral Doppler, color Doppler, and power Doppler as needed of all accessible portions of each vessel. Bilateral testing is considered an integral part of a complete examination. Limited examinations for reoccurring indications may be performed as noted.  +--------+--------+-----+--------+---------+--------+ RIGHT   PSV cm/sRatioStenosisWaveform Comments +--------+--------+-----+--------+---------+--------+ CFA Mid                      triphasic         +--------+--------+-----+--------+---------+--------+ DFA                          triphasic         +--------+--------+-----+--------+---------+--------+ SFA Prox                     triphasic         +--------+--------+-----+--------+---------+--------+  Summary: Right: There is no evidence of pseudoaneurysm.  See table(s) above for measurements and observations. Electronically signed by Servando Snare MD on 12/20/2019 at 3:24:37 PM.    Final     Cardiac Studies   Cath: 12/19/19   Prox RCA lesion is 100% stenosed. SVG to RCA is widely patent.  Dist LAD lesion is 90% stenosed. Too small and distal for PCI.  3rd Diag lesion is  50% stenosed.  Prox Cx lesion is 100% stenosed. Left to left collaterals.  Ramus lesion is 100% stenosed.  SVG to ramus with Origin to Prox Graft lesion is 95% stenosed.  A drug-eluting stent was successfully placed using a SYNERGY XD [id number], postdilated to >4 mm. Optimized with IVUS.  Post intervention, there is a 0% residual stenosis.  There is moderate to severe left ventricular systolic dysfunction.  There  is no aortic valve stenosis.  Hemodynamic findings consistent with moderate pulmonary hypertension.   Continue dual antiplatelet therapy for 6 months at least.  COnsider clopidogrel longer term if there is no interaction with his HIV meds, given the diffuse nature of CAD.   Diagnostic Dominance: Right  Intervention     Patient Profile     69 y.o. male with PMH of CABG in '04, ESRD on HD, COPD, HTN, HL, HIV, DM, Chronic systolic HF who presented for outpatient cardiac cath.   Assessment & Plan    1. CAD s/p CABG:  -s/p cath with PCI/DES x1 to SVG-Ramus, patent SVG-RCA. -denies any further CP or SOB -d/c cancelled  due to black tarry stools and drop in Hbg to 7.3 -now on DAPT with ASA and Brilinta 90mg  BID for at least 6 months -continue statin and long acting nitrates  2. Anemia:  -Hgb dropped from 10>>7.3 with heme + stool which are dark and tarry -Reports he has had issues with blood in his stool over the past month.  -Had a colonoscopy about 5 years prior which was normal per his report.  -had more dark stools overnight -appreciated GI input - plan for EGD this am -Hbg improved to 9 after PRBCs yesterday with HD but now back down to 8.1 last night and 8.6 this am -hemodynamically stable  3. ESRD on HD: - followed by Neprhology -HD per renal  4. HTN:  -BP controlled -on no BP meds at present  5. HLD: -continue Atorva 80mg  daily and fenofibrate -LDL 29 this am with TAGs 95 and HDL 17  For questions or updates, please contact [place] Please consult [email or web address] for contact info under    Signed, Fransico Him, MD  12/22/2019, 7:35 AM

## 2019-12-22 NOTE — Progress Notes (Signed)
Patient was upset about not being discharged. Reached out to MD to have them come explain why he was still staying. Discussed at great length the need to stay until the MD releases him. Verbalized understanding and agreed to stay. Will continue to monitor.

## 2019-12-22 NOTE — Anesthesia Preprocedure Evaluation (Addendum)
Anesthesia Evaluation  Patient identified by MRN, date of birth, ID band Patient awake    Reviewed: Allergy & Precautions, NPO status   History of Anesthesia Complications (+) MALIGNANT HYPERTHERMIA  Airway Mallampati: II       Dental   Pulmonary shortness of breath, pneumonia, COPD, Current Smoker and Patient abstained from smoking.,    breath sounds clear to auscultation       Cardiovascular hypertension, + CAD, + Past MI and +CHF   Rhythm:Regular Rate:Normal     Neuro/Psych  Neuromuscular disease    GI/Hepatic GERD  ,(+) Hepatitis -  Endo/Other  diabetes  Renal/GU Renal disease     Musculoskeletal  (+) Arthritis ,   Abdominal   Peds  Hematology  (+) anemia ,   Anesthesia Other Findings   Reproductive/Obstetrics                           Anesthesia Physical Anesthesia Plan  ASA: III  Anesthesia Plan: MAC   Post-op Pain Management:    Induction: Intravenous  PONV Risk Score and Plan: Midazolam  Airway Management Planned: Nasal Cannula and Simple Face Mask  Additional Equipment:   Intra-op Plan:   Post-operative Plan:   Informed Consent: I have reviewed the patients History and Physical, chart, labs and discussed the procedure including the risks, benefits and alternatives for the proposed anesthesia with the patient or authorized representative who has indicated his/her understanding and acceptance.     Dental advisory given  Plan Discussed with: Anesthesiologist and CRNA  Anesthesia Plan Comments:         Anesthesia Quick Evaluation

## 2019-12-22 NOTE — Transfer of Care (Signed)
Immediate Anesthesia Transfer of Care Note  Patient: Jacob [name]  Procedure(s) Performed: ESOPHAGOGASTRODUODENOSCOPY (EGD) WITH PROPOFOL (N/A ) HOT HEMOSTASIS (ARGON PLASMA COAGULATION/BICAP) (N/A ) HEMOSTASIS CLIP PLACEMENT SCLEROTHERAPY  Patient Location: Endoscopy Unit  Anesthesia Type:MAC  Level of Consciousness: responds to stimulation  Airway & Oxygen Therapy: Patient Spontanous Breathing and Patient connected to nasal cannula oxygen  Post-op Assessment: Report given to RN and Post -op Vital signs reviewed and stable  Post vital signs: Reviewed and stable  Last Vitals:  Vitals Value Taken Time  BP    Temp    Pulse    Resp    SpO2      Last Pain:  Vitals:   12/22/19 0843  TempSrc: Oral  PainSc: 6       Patients Stated Pain Goal: 0 (50/56/97 9480)  Complications: No apparent anesthesia complications

## 2019-12-22 NOTE — Op Note (Signed)
[facility] Patient Name: Jacob [name] Procedure Date : 12/22/2019 MRN: [id number] Attending MD: Gerrit Heck , MD Date of Birth: [birth year] CSN: [id number] Age: 69 Admit Type: Inpatient Procedure:                Upper GI endoscopy Indications:              Acute post hemorrhagic anemia, Melena                           69 yo male with a known history of gastric and                            duodenal AVMs s/p APC in 2020, now with melena and                            anemia in the setting of PCI with DES earlier this                            admission. On DAPT (Brilinta and ASA). Transfused                            1U pRBCs. Providers:                Gerrit Heck, MD, [name], RN, Josie Dixon, RN, Laverda Sorenson, Technician, Manuela Schwartz, CRNA Referring MD:              Medicines:                Monitored Anesthesia Care Complications:            No immediate complications. Estimated Blood Loss:     Estimated blood loss was minimal. Procedure:                Pre-Anesthesia Assessment:                           - Prior to the procedure, a History and Physical                            was performed, and patient medications and                            allergies were reviewed. The patient's tolerance of                            previous anesthesia was also reviewed. The risks                            and benefits of the procedure and the sedation  options and risks were discussed with the patient.                            All questions were answered, and informed consent                            was obtained. Prior Anticoagulants: The patient has                            taken ASA 81 mg and Brilinta, last dose was 1 day                            prior to procedure. ASA Grade Assessment: III - A                            patient with severe systemic disease.  After                            reviewing the risks and benefits, the patient was                            deemed in satisfactory condition to undergo the                            procedure.                           After obtaining informed consent, the endoscope was                            passed under direct vision. Throughout the                            procedure, the patient's blood pressure, pulse, and                            oxygen saturations were monitored continuously. The                            [id number]-[id number] ([id number] Olympus gastroscope was                            introduced through the mouth, and advanced to the                            second part of duodenum. The upper GI endoscopy was                            accomplished without difficulty. The patient                            tolerated the procedure well. Scope In: Scope Out: Findings:      The examined esophagus was normal.  Four 3 mm angiodysplastic lesions with bleeding were found in the       gastric fundus and in the gastric body. 2 were actively bleeding. 1 was       treated with argon plasma followed by two hemostatic clips, and injected       with 2 mL of a 1:10,000 solution of epinephrine for hemostasis. The 2nd       active bleeding lesion was treated with hemostatic clip x1. The other       two were treated with APC. There was no bleeding at the conclusion of       this exam. Estimated blood loss was minimal.      The incisura, gastric antrum and pylorus were normal.      The duodenal bulb, first portion of the duodenum and second portion of       the duodenum were normal. Impression:               - Normal esophagus.                           - Four bleeding angiodysplastic lesions in the                            stomach. Two were actively bleeding. All were                            successfully treated with a combination of APC,                            hemostatic clips and  Epinephrien injection as                            outlined above. There was no bleeding at the                            conclusion of the exam.                           - Normal incisura, antrum and pylorus.                           - Normal duodenal bulb, first portion of the                            duodenum and second portion of the duodenum.                           - No specimens collected. Recommendation:           - Return patient to hospital ward for ongoing care.                           - Clear liquid diet today and advance as tolerated.                           - Continue present medications.                           -  Use Protonix (pantoprazole) 40 mg PO BID for 8                            weeks. Would then transition to Protonix 40 mg/day                            and maintain some degree of chronic acid                            suppression therapy given need for DAPT.                           - Use sucralfate suspension 1 gram PO BID for 2                            weeks to promote further mucosal healing.                           - Repeat EGD as needed.                           - Repeat Hgb later today. Procedure Code(s):        --- Professional ---                           [phone], Esophagogastroduodenoscopy, flexible,                            transoral; with control of bleeding, any method Diagnosis Code(s):        --- Professional ---                           Y60.630, Angiodysplasia of stomach and duodenum                            with bleeding                           D62, Acute posthemorrhagic anemia                           K92.1, Melena (includes Hematochezia) CPT copyright 2019 [employer] Association. All rights reserved. The codes documented in this report are preliminary and upon coder review may  be revised to meet current compliance requirements. Gerrit Heck, MD 12/22/2019 9:59:34 AM Number of Addenda: 0

## 2019-12-22 NOTE — Anesthesia Postprocedure Evaluation (Signed)
Anesthesia Post Note  Patient: MEZIAH BLASINGAME  Procedure(s) Performed: ESOPHAGOGASTRODUODENOSCOPY (EGD) WITH PROPOFOL (N/A ) HOT HEMOSTASIS (ARGON PLASMA COAGULATION/BICAP) (N/A ) [place]     Patient location during evaluation: Endoscopy Anesthesia Type: MAC Level of consciousness: awake Pain management: pain level controlled Vital Signs Assessment: post-procedure vital signs reviewed and stable Respiratory status: spontaneous breathing Cardiovascular status: stable Postop Assessment: no apparent nausea or vomiting Anesthetic complications: no    Last Vitals:  Vitals:   12/22/19 0945 12/22/19 1000  BP: (!) 106/50 134/64  Pulse: 77   Resp: 12 16  Temp: 36.4 C   SpO2: 100% 100%    Last Pain:  Vitals:   12/22/19 1000  TempSrc:   PainSc: 0-No pain                 Samit Sylve

## 2019-12-23 DIAGNOSIS — I25709 Atherosclerosis of coronary artery bypass graft(s), unspecified, with unspecified angina pectoris: Secondary | ICD-10-CM

## 2019-12-23 LAB — HEMOGLOBIN AND HEMATOCRIT, BLOOD
HCT: 26.9 % — ABNORMAL LOW (ref 39.0–52.0)
Hemoglobin: 8.3 g/dL — ABNORMAL LOW (ref 13.0–17.0)

## 2019-12-23 LAB — RENAL FUNCTION PANEL
Albumin: 2.5 g/dL — ABNORMAL LOW (ref 3.5–5.0)
Anion gap: 13 (ref 5–15)
BUN: 80 mg/dL — ABNORMAL HIGH (ref 8–23)
CO2: 22 mmol/L (ref 22–32)
Calcium: 8.6 mg/dL — ABNORMAL LOW (ref 8.9–10.3)
Chloride: 101 mmol/L (ref 98–111)
Creatinine, Ser: 7.28 mg/dL — ABNORMAL HIGH (ref 0.61–1.24)
GFR calc Af Amer: 8 mL/min — ABNORMAL LOW (ref >60)
GFR calc non Af Amer: 7 mL/min — ABNORMAL LOW (ref >60)
Glucose, Bld: 199 mg/dL — ABNORMAL HIGH (ref 70–99)
Phosphorus: 5.1 mg/dL — ABNORMAL HIGH (ref 2.5–4.6)
Potassium: 3.8 mmol/L (ref 3.5–5.1)
Sodium: 136 mmol/L (ref 135–145)

## 2019-12-23 LAB — GLUCOSE, CAPILLARY
Glucose-Capillary: 108 mg/dL — ABNORMAL HIGH (ref 70–99)
Glucose-Capillary: 150 mg/dL — ABNORMAL HIGH (ref 70–99)

## 2019-12-23 MED ORDER — PANTOPRAZOLE SODIUM 40 MG PO TBEC: 40.0000 mg | DELAYED_RELEASE_TABLET | Freq: Two times a day (BID) | ORAL | 1 refills | Status: DC

## 2019-12-23 MED ORDER — CALCITRIOL 0.5 MCG PO CAPS
ORAL_CAPSULE | ORAL | Status: AC
  Filled 2019-12-23: qty 2

## 2019-12-23 MED ORDER — SUCRALFATE 1 GM/10ML PO SUSP: 1.0000 g | Freq: Two times a day (BID) | ORAL | 0 refills | Status: DC

## 2019-12-23 MED FILL — SUCRALFATE 1 GM/10ML SUSP: 1 | 21 days supply | Qty: 420 | Fill #0

## 2019-12-23 NOTE — Progress Notes (Signed)
[facility] Progress Note   Subjective:   Pt seen on HD. Denies CP, palpitations, dyspnea, cough, abdominal pain, N/V/D. Frustrated that he has not been able to go home yet.   Objective Vitals:   12/23/19 0639 12/23/19 0722 12/23/19 0806 12/23/19 0830  BP: 132/69  133/63 136/67  Pulse: 88  88 87  Resp: 20     Temp: 97.8 F (36.6 C)     TempSrc: Oral     SpO2: 100% 100%    Weight: 82.4 kg     Height:       Physical Exam General: Well developed, elderly appearing male. Alert and in NAD Heart: RRR, no murmurs, rubs or gallops Lungs: CTA bilaterally without wheezing, rhonchi or rales Abdomen: Soft, non-tender, non-distended. +BS Extremities: no edema b/l lower extremities Dialysis Access:  LUE AVF currently accessed  Additional Objective Labs: Basic Metabolic Panel: Recent Labs  Lab 12/19/19 0547 12/19/19 0814 12/19/19 0817 12/20/19 0311 12/21/19 0352  NA 136   < > 137 139 138  K 5.1   < > 3.0* 3.6 3.8  CL 94*  --   --  99 99  CO2 29  --   --  28 26  GLUCOSE 133*  --   --  133* 83  BUN 34*  --   --  76* 37*  CREATININE 5.10*  --   --  5.94* 4.47*  CALCIUM 8.8*  --   --  8.4* 8.9   < > = values in this interval not displayed.   CBC: Recent Labs  Lab 12/17/19 1042 12/19/19 0547 12/19/19 0814 12/20/19 0311 12/20/19 0311 12/20/19 0628 12/20/19 0628 12/21/19 0352 12/21/19 1327 12/22/19 1325 12/22/19 2112 12/23/19 0657  WBC 4.5 3.5*   < > 4.7  --  4.8  --  5.6  --   --   --   --   NEUTROABS 2.8  --   --   --   --   --   --   --   --   --   --   --   HGB 8.4* 9.3*   < > 7.0*   < > 7.3*   < > 9.0*   < > 8.3* 7.5* 8.3*  HCT 26.0* 29.7*   < > 22.5*   < > 23.1*   < > 28.3*   < > 26.5* 23.8* 26.9*  MCV 81 86.8  --  87.9  --  86.5  --  87.9  --   --   --   --   PLT 145* 149*   < > 142*  --  147*  --  158  --   --   --   --    < > = values in this interval not displayed.   Blood Culture    Component Value Date/Time   SDES BLOOD RIGHT HAND  07/25/2019 1421   SDES BLOOD RIGHT HAND 07/25/2019 1421   SPECREQUEST  07/25/2019 1421    BOTTLES DRAWN AEROBIC ONLY Blood Culture adequate volume   SPECREQUEST  07/25/2019 1421    BOTTLES DRAWN AEROBIC ONLY Blood Culture adequate volume   CULT  07/25/2019 1421    NO GROWTH 5 DAYS Performed at [facility], [place] [address] [place], [place] [address]    CULT  07/25/2019 1421    NO GROWTH 5 DAYS Performed at [facility], [place] [address], [place], [place] [address]    REPTSTATUS 07/30/2019 FINAL  07/25/2019 1421   REPTSTATUS 07/30/2019 FINAL 07/25/2019 1421    CBG: Recent Labs  Lab 12/22/19 1121 12/22/19 1705 12/22/19 2105 12/23/19 0631 12/23/19 0726  GLUCAP 149* 106* 175* 108* 150*   Medications: . sodium chloride Stopped (12/22/19 0946)   . sodium chloride   Intravenous Once  . allopurinol  100 mg Oral Daily  . aspirin  81 mg Oral Daily  . atorvastatin  80 mg Oral q1800  . bictegravir-emtricitabine-tenofovir AF  1 tablet Oral Daily  . calcitRIOL  1 mcg Oral Q M,W,F-HD  . fenofibrate  160 mg Oral Daily  . gabapentin  300 mg Oral Daily  . insulin aspart  0-15 Units Subcutaneous TID WC  . insulin aspart  0-5 Units Subcutaneous QHS  . isosorbide mononitrate  30 mg Oral Daily  . latanoprost  1 drop Both Eyes QHS  . Melatonin  3 mg Oral QHS  . multivitamin  1 tablet Oral QHS  . pantoprazole  40 mg Oral BID  . sevelamer carbonate  800 mg Oral TID WC  . sodium chloride flush  3 mL Intravenous Q12H  . sucralfate  1 g Oral BID  . ticagrelor  90 mg Oral BID  . timolol  1 drop Both Eyes Daily  . umeclidinium-vilanterol  1 puff Inhalation Daily    Dialysis Orders: [facility] MWF 4 hrs 180NRe 400/800 83 kg 2.0 K/ 2.0 Ca L AVF No Heparin -Venofer 100 mg IV X 10 (2/10 doses given 12/18/2019) -Calcitriol 1.0 mcg PO TIW -Mircera 200 mcg IV q 2 weeks (last 12/18/2019)  Assessment/Plan: 1. Chest Pain/DOE:S/p LHC withDES to SVG Ramus.Will be onDAPT for 6 months. Per  cardiology 2. Symptomatic Anemia:Bloody + black stools - s/p EGD 2/28 showing 4 AVMs -> treated.Just given ESA on 2/24. S/p 1U PRBCs on 2/26. Hgb up to 8.3 today.  3. ESRD: Continue HD per MWF schedule, no heparin on HD  4. Hypertension/volume: BP stable, below prior dry weight, lower at discharge.  5. Metabolic bone disease: Ca 8.9, no phos reported during this admission, will check with next HD if pt not discharged. Continuebinders, VDRA 6. Nutrition: Continue renal diet. 7. DM: Per primary 8. HIV: Continue meds per primary    Anice Paganini, PA-C 12/23/2019, 8:34 AM  [facility] Pager: [phone]

## 2019-12-24 ENCOUNTER — Telehealth: Payer: Self-pay | Admitting: Physician Assistant

## 2019-12-24 ENCOUNTER — Ambulatory Visit: Payer: Medicare Other | Admitting: Cardiovascular Disease

## 2019-12-24 NOTE — Telephone Encounter (Signed)
Transition of care from inpatient facility  Date of discharge: 12/23/19 Date of contact: 12/24/19 Method of contact: Phone  Patient contacted to discuss transition of care from inpatient facility. Patient was discharged from [facility] on 12/23/19 with a diagnosis of CAD s/p CABG, GI bleed, and ESRD. Reviewed discharge medications, patient reports he has protonix at home. Patient will follow up at his outpatient dialysis center tomorrow. Other follow up needed includes PCP, GI, and cardiology.  Reports he is able to complete his ADLs and has no further needs.   Anice Paganini, PA-C 12/24/2019, 1:14 PM  [facility] Pager: [phone]

## 2019-12-25 ENCOUNTER — Encounter (HOSPITAL_COMMUNITY): Payer: Self-pay | Admitting: *Deleted

## 2019-12-25 DIAGNOSIS — N186 End stage renal disease: Secondary | ICD-10-CM | POA: Diagnosis not present

## 2019-12-25 DIAGNOSIS — I132 Hypertensive heart and chronic kidney disease with heart failure and with stage 5 chronic kidney disease, or end stage renal disease: Secondary | ICD-10-CM | POA: Diagnosis not present

## 2019-12-25 DIAGNOSIS — E1122 Type 2 diabetes mellitus with diabetic chronic kidney disease: Secondary | ICD-10-CM | POA: Diagnosis not present

## 2019-12-25 DIAGNOSIS — Z992 Dependence on renal dialysis: Secondary | ICD-10-CM | POA: Diagnosis not present

## 2019-12-25 DIAGNOSIS — N2581 Secondary hyperparathyroidism of renal origin: Secondary | ICD-10-CM | POA: Diagnosis not present

## 2019-12-25 DIAGNOSIS — D509 Iron deficiency anemia, unspecified: Secondary | ICD-10-CM | POA: Diagnosis not present

## 2019-12-25 DIAGNOSIS — D631 Anemia in chronic kidney disease: Secondary | ICD-10-CM | POA: Diagnosis not present

## 2019-12-25 DIAGNOSIS — E785 Hyperlipidemia, unspecified: Secondary | ICD-10-CM | POA: Diagnosis not present

## 2019-12-25 DIAGNOSIS — I2510 Atherosclerotic heart disease of native coronary artery without angina pectoris: Secondary | ICD-10-CM | POA: Diagnosis not present

## 2019-12-25 NOTE — Progress Notes (Signed)
Referral received and verified for MD signature.  Follow up appt is 02/13/20. Pt was scheduled for 3/10 but this appt was cancelled due to needing T/TH appt because of dialysis. Insurance benefits and eligibility to be determined upon the satisfactory completion of follow up appt and clearances from the nephrologist. Maurice Small RN, BSN Cardiac and Pulmonary Rehab Nurse Navigator

## 2019-12-26 ENCOUNTER — Telehealth: Payer: Self-pay | Admitting: Nurse Practitioner

## 2019-12-26 NOTE — Telephone Encounter (Signed)
Spoke with the patient. He has decided the discomfort he feels in his right hip area is not GI related. It feels better since he has taken Tylenol. He has just come home from "walking around [place] for 2 hours." Denies constipation. He is not having any upper GI distress. He does mention he will probably go back to taking stool softeners and stopping Metamucil due to the fluid restrictions. Otherwise he states he is "good."

## 2019-12-26 NOTE — Telephone Encounter (Signed)
Left message to return my call.  

## 2019-12-27 DIAGNOSIS — Z992 Dependence on renal dialysis: Secondary | ICD-10-CM | POA: Diagnosis not present

## 2019-12-27 DIAGNOSIS — D631 Anemia in chronic kidney disease: Secondary | ICD-10-CM | POA: Diagnosis not present

## 2019-12-27 DIAGNOSIS — E1122 Type 2 diabetes mellitus with diabetic chronic kidney disease: Secondary | ICD-10-CM | POA: Diagnosis not present

## 2019-12-27 DIAGNOSIS — D509 Iron deficiency anemia, unspecified: Secondary | ICD-10-CM | POA: Diagnosis not present

## 2019-12-27 DIAGNOSIS — N186 End stage renal disease: Secondary | ICD-10-CM | POA: Diagnosis not present

## 2019-12-27 DIAGNOSIS — N2581 Secondary hyperparathyroidism of renal origin: Secondary | ICD-10-CM | POA: Diagnosis not present

## 2019-12-30 ENCOUNTER — Telehealth: Payer: Self-pay | Admitting: Nurse Practitioner

## 2019-12-30 ENCOUNTER — Telehealth: Payer: Self-pay

## 2019-12-30 DIAGNOSIS — D509 Iron deficiency anemia, unspecified: Secondary | ICD-10-CM | POA: Diagnosis not present

## 2019-12-30 DIAGNOSIS — N2581 Secondary hyperparathyroidism of renal origin: Secondary | ICD-10-CM | POA: Diagnosis not present

## 2019-12-30 DIAGNOSIS — Z992 Dependence on renal dialysis: Secondary | ICD-10-CM | POA: Diagnosis not present

## 2019-12-30 DIAGNOSIS — N186 End stage renal disease: Secondary | ICD-10-CM | POA: Diagnosis not present

## 2019-12-30 DIAGNOSIS — D631 Anemia in chronic kidney disease: Secondary | ICD-10-CM | POA: Diagnosis not present

## 2019-12-30 DIAGNOSIS — E1122 Type 2 diabetes mellitus with diabetic chronic kidney disease: Secondary | ICD-10-CM | POA: Diagnosis not present

## 2019-12-30 NOTE — Telephone Encounter (Signed)
Patient called frustrated wanting to know he has been given so much Iron even after having blood transfusion and blood work done. Patient states that he will be home around 3:30pm to discuss his concerns. Please Advise.

## 2019-12-30 NOTE — Telephone Encounter (Signed)
Patient states that Iron was being given through the tubes at Dialysis , patient states that Hemoglobin is up to date and asked Nurse why he was being given Iron? Nurse responded that he was still loosing blood.

## 2019-12-30 NOTE — Telephone Encounter (Signed)
Spoke with the patient. He had a transfusion post GI bleed. Today in dialysis, his nurse "gave me iron." The patient asked why he was getting iron when he had been given the blood transfusion and his hemoglobin had improved. The nurse told the patient "because you are bleeding." Patient states he is not seeing any blood in his stools and has not had black stools. Patient states this upsets. We discussed hemoglobin and iron storage. We very briefly touched on renal disease and decreased red blood cell production.  Patient expresses gratitude for the phone call stating "okay, that makes sense. I feel better now." Plans to keep his post hospitalization follow up with [place] on 01/07/20.

## 2019-12-30 NOTE — Telephone Encounter (Signed)
Who is prescribing the iron and how much are they recommending?

## 2019-12-31 ENCOUNTER — Encounter: Payer: Self-pay | Admitting: Podiatry

## 2019-12-31 ENCOUNTER — Ambulatory Visit (INDEPENDENT_AMBULATORY_CARE_PROVIDER_SITE_OTHER): Payer: Medicare Other

## 2019-12-31 ENCOUNTER — Ambulatory Visit: Payer: Medicare Other | Admitting: Internal Medicine

## 2019-12-31 ENCOUNTER — Other Ambulatory Visit: Payer: Self-pay

## 2019-12-31 ENCOUNTER — Ambulatory Visit (INDEPENDENT_AMBULATORY_CARE_PROVIDER_SITE_OTHER): Payer: Medicare Other | Admitting: Podiatry

## 2019-12-31 VITALS — Temp 96.9°F

## 2019-12-31 DIAGNOSIS — M79674 Pain in right toe(s): Secondary | ICD-10-CM | POA: Diagnosis not present

## 2019-12-31 DIAGNOSIS — E11621 Type 2 diabetes mellitus with foot ulcer: Secondary | ICD-10-CM

## 2019-12-31 DIAGNOSIS — E1142 Type 2 diabetes mellitus with diabetic polyneuropathy: Secondary | ICD-10-CM | POA: Diagnosis not present

## 2019-12-31 DIAGNOSIS — L97521 Non-pressure chronic ulcer of other part of left foot limited to breakdown of skin: Secondary | ICD-10-CM | POA: Diagnosis not present

## 2019-12-31 DIAGNOSIS — L97512 Non-pressure chronic ulcer of other part of right foot with fat layer exposed: Secondary | ICD-10-CM | POA: Diagnosis not present

## 2019-12-31 DIAGNOSIS — M79675 Pain in left toe(s): Secondary | ICD-10-CM

## 2019-12-31 DIAGNOSIS — B351 Tinea unguium: Secondary | ICD-10-CM

## 2019-12-31 MED ORDER — DOXYCYCLINE HYCLATE 50 MG PO CAPS: 50.0000 mg | ORAL_CAPSULE | Freq: Two times a day (BID) | ORAL | 0 refills | Status: DC

## 2019-12-31 NOTE — Progress Notes (Signed)
Subjective: Jacob [name] presents today for follow up of at risk foot care. Patient has history of diabetic ulceration plantar aspect of his right foot. He has been followed by Dr. Amalia Hailey and ulceration had healed. Patient states his dialysis nurses became concerned about the right foot during foot check at his dialysis center.   Dialysis days are MWF. Chair time is 11 a.m.   He still smokes cigarettes about 1/2 ppd.  Jacob [name] denies any drainage coming from the area, but states it is tender when he bears weight. He has not had any fever, chills, night sweats, nausea or vomiting.   He also states he has been using an Arboriculturist. States he has socks on his feet with the electric blanket.  Jacob [name] he had diarrhea with Augmentin. He has it listed as an allergy now.  Allergies  Allergen Reactions  . Augmentin [Amoxicillin-Pot Clavulanate] Diarrhea    Severe  . Amphetamines Other (See Comments)    Unknown reaction type    Objective: Pt is 22 AA male, WD, WN in NAD. AAO x 3. Vitals:   12/31/19 1451 12/31/19 1618  Temp: (!) 96.2 F (35.7 C) (!) 96.9 F (36.1 C)   Vascular Examination:  Capillary fill time to digits <3 seconds b/l. Palpable DP pulses b/l. Palpable PT pulses b/l. Pedal hair absent b/l Skin temperature gradient within normal limits b/l.  Dermatological Examination: Pedal skin with normal turgor, texture and tone bilaterally. No interdigital macerations bilaterally. Toenails 1-5 b/l elongated, dystrophic, thickened, crumbly with subungual debris and tenderness to dorsal palpation.    Ulceration #1 located left hallux.  Predebridement measurements carried out today of 1.0 cm in diameter with subdermal hemorrhage. There is no underlying flocculence.No purulence expressed from ulceration..  No periulcerative erythema, no edema, no drainage.       Ulcer #1 left hallux: Postdebridement measurements are 0.3 x 0.2 cm to level of dermis. Postdebridement,  there is no undermining, no tunneling, no visible joint or bone exposure, no probing to bone, no odor. Base of ulcer is noted to be granular.    Ulceration #2 is located submet head 4 right foot. Predebridement measurements carried out today of 2.5 x 3.0 cm with elevated hyperkeratosis with subdermal hemorrhage. There is underlying flocculence.No purulence expressed from ulceration..  No periulcerative erythema, no edema, no drainage.   Roof of ulcer is elevated hyperkeratotic lesion with subdermal hemorrhage.   Postdebridement measurements are 3.0 x 3.0 x 0.3 cm to level of muscle. cm. There is mild odor. Postdebridement, there is no undermining, no tunneling, no visible joint or bone exposure, no probing to bone. Base of ulcer is noted to be fibrogranular.    Musculoskeletal: Normal muscle strength 5/5 to all lower extremity muscle groups bilaterally, no pain crepitus or joint limitation noted with ROM b/l, bunion deformity noted b/l and hammertoes noted to the  2-5 bilaterally.  Neurological: Protective sensation diminished with 10g monofilament b/l.  Xray findings right foot: no gas in tissues, +vessel calcifications noted, no foreign body evident right foot and no bone erosion noted at location of ulceration.   Assessment: 1. Pain due to onychomycosis of toenails of both feet   2. Ulcer of right foot with fat layer exposed ([place])   3. Diabetic ulcer of toe of left foot associated with type 2 diabetes mellitus, limited to breakdown of skin ([place])   4. Diabetic peripheral neuropathy associated with type 2 diabetes mellitus ([place])    Plan: -Toenails 1-5  b/l were debrided in length and girth with sterile nail nippers and dremel without iatrogenic bleeding.  -Advised patient to discontinue use of electric blanket on his feet immediately. Informed him of possibility of injury of burning his feet. -Patient to continue soft, supportive shoe gear daily. -Patient to report any pedal injuries to  medical professional immediately. -Ulcer was debrided of nonviable necrotic tissue and was resected to the level of subcutaneous tissue. Ulcer was cleansed with wound cleanser. Iodosorb Gel was applied to base of wound with light dressing. -Wound culture and sensitivity taken of right foot ulceration. -Xray of right foot was performed and reviewed with patient and/or POA. -Patient was given written instructions on offloading and dressing changes/aftercare and was instructed to call immediately if any signs or symptoms of infection arise. He is to keep feet dry. He is to apply Neosporin Cream to left hallux once daily.  -Rx for doxycycline 50 mg po bid x one week. -Patient instructed to report to emergency department with worsening appearance of ulcer/toe/foot, increased pain, foul odor, increased redness, swelling, drainage, fever, chills, nightsweats, nausea, vomiting, increased blood sugar.  -Patient to follow up with Dr. Jacqualyn Posey for follow-up of ulcerations left hallux and submetatarsal head 4 right foot. -Patient/POA to call should there be question/concern in the interim.  Return in about 1 week (around 01/07/2020) for diabetic ulcer right, with Dr. Jacqualyn Posey.

## 2019-12-31 NOTE — Telephone Encounter (Signed)
Noted, also with CKD hgb can be low and he is following with GI

## 2019-12-31 NOTE — Patient Instructions (Addendum)
DRESSING CHANGES RIGHT FOOT:  WEAR SURGICAL SHOE AT ALL TIMES    1. KEEP RIGHT  FOOT DRY AT ALL TIMES!!!!  2. CLEANSE ULCER WITH SALINE.  3. DAB DRY WITH GAUZE SPONGE.  4. APPLY A LIGHT AMOUNT OF IODOSORB TO BASE OF ULCER.  5. APPLY OUTER DRESSING/BAND-AID AS INSTRUCTED.  6. WEAR SURGICAL SHOE DAILY AT ALL TIMES.  7. DO NOT WALK BAREFOOT!!!  8.  IF YOU EXPERIENCE ANY FEVER, CHILLS, NIGHTSWEATS, NAUSEA OR VOMITING, ELEVATED OR LOW BLOOD SUGARS, REPORT TO EMERGENCY ROOM.  9. IF YOU EXPERIENCE INCREASED REDNESS, PAIN, SWELLING, DISCOLORATION, ODOR, PUS, DRAINAGE OR WARMTH OF YOUR FOOT, REPORT TO EMERGENCY ROOM.  Corns and Calluses Corns are small areas of thickened skin that occur on the top, sides, or tip of a toe. They contain a cone-shaped core with a point that can press on a nerve below. This causes pain.  Calluses are areas of thickened skin that can occur anywhere on the body, including the hands, fingers, palms, soles of the feet, and heels. Calluses are usually larger than corns. What are the causes? Corns and calluses are caused by rubbing (friction) or pressure, such as from shoes that are too tight or do not fit properly. What increases the risk? Corns are more likely to develop in people who have misshapen toes (toe deformities), such as hammer toes. Calluses can occur with friction to any area of the skin. They are more likely to develop in people who:  Work with their hands.  Wear shoes that fit poorly, are too tight, or are high-heeled.  Have toe deformities. What are the signs or symptoms? Symptoms of a corn or callus include:  A hard growth on the skin.  Pain or tenderness under the skin.  Redness and swelling.  Increased discomfort while wearing tight-fitting shoes, if your feet are affected. If a corn or callus becomes infected, symptoms may include:  Redness and swelling that gets worse.  Pain.  Fluid, blood, or pus draining from the corn or  callus. How is this diagnosed? Corns and calluses may be diagnosed based on your symptoms, your medical history, and a physical exam. How is this treated? Treatment for corns and calluses may include:  Removing the cause of the friction or pressure. This may involve: ? Changing your shoes. ? Wearing shoe inserts (orthotics) or other protective layers in your shoes, such as a corn pad. ? Wearing gloves.  Applying medicine to the skin (topical medicine) to help soften skin in the hardened, thickened areas.  Removing layers of dead skin with a file to reduce the size of the corn or callus.  Removing the corn or callus with a scalpel or laser.  Taking antibiotic medicines, if your corn or callus is infected.  Having surgery, if a toe deformity is the cause. Follow these instructions at home:   Take over-the-counter and prescription medicines only as told by your health care provider.  If you were prescribed an antibiotic, take it as told by your health care provider. Do not stop taking it even if your condition starts to improve.  Wear shoes that fit well. Avoid wearing high-heeled shoes and shoes that are too tight or too loose.  Wear any padding, protective layers, gloves, or orthotics as told by your health care provider.  Soak your hands or feet and then use a file or pumice stone to soften your corn or callus. Do this as told by your health care provider.  Check your corn  or callus every day for symptoms of infection. Contact a health care provider if you:  Notice that your symptoms do not improve with treatment.  Have redness or swelling that gets worse.  Notice that your corn or callus becomes painful.  Have fluid, blood, or pus coming from your corn or callus.  Have new symptoms. Summary  Corns are small areas of thickened skin that occur on the top, sides, or tip of a toe.  Calluses are areas of thickened skin that can occur anywhere on the body, including the  hands, fingers, palms, and soles of the feet. Calluses are usually larger than corns.  Corns and calluses are caused by rubbing (friction) or pressure, such as from shoes that are too tight or do not fit properly.  Treatment may include wearing any padding, protective layers, gloves, or orthotics as told by your health care provider. This information is not intended to replace advice given to you by your health care provider. Make sure you discuss any questions you have with your health care provider. Document Revised: 01/30/2019 Document Reviewed: 08/23/2017 Elsevier Patient Education  [place].  Diabetes Mellitus and [place] care is an important part of your health, especially when you have diabetes. Diabetes may cause you to have problems because of poor blood flow (circulation) to your feet and legs, which can cause your skin to:  Become thinner and drier.  Break more easily.  Heal more slowly.  Peel and crack. You may also have nerve damage (neuropathy) in your legs and feet, causing decreased feeling in them. This means that you may not notice minor injuries to your feet that could lead to more serious problems. Noticing and addressing any potential problems early is the best way to prevent future foot problems. How to care for your feet Foot hygiene  Wash your feet daily with warm water and mild soap. Do not use hot water. Then, pat your feet and the areas between your toes until they are completely dry. Do not soak your feet as this can dry your skin.  Trim your toenails straight across. Do not dig under them or around the cuticle. File the edges of your nails with an emery board or nail file.  Apply a moisturizing lotion or petroleum jelly to the skin on your feet and to dry, brittle toenails. Use lotion that does not contain alcohol and is unscented. Do not apply lotion between your toes. Shoes and socks  Wear clean socks or stockings every day. Make sure they  are not too tight. Do not wear knee-high stockings since they may decrease blood flow to your legs.  Wear shoes that fit properly and have enough cushioning. Always look in your shoes before you put them on to be sure there are no objects inside.  To break in new shoes, wear them for just a few hours a day. This prevents injuries on your feet. Wounds, scrapes, corns, and calluses  Check your feet daily for blisters, cuts, bruises, sores, and redness. If you cannot see the bottom of your feet, use a mirror or ask someone for help.  Do not cut corns or calluses or try to remove them with medicine.  If you find a minor scrape, cut, or break in the skin on your feet, keep it and the skin around it clean and dry. You may clean these areas with mild soap and water. Do not clean the area with peroxide, alcohol, or iodine.  If you  have a wound, scrape, corn, or callus on your foot, look at it several times a day to make sure it is healing and not infected. Check for: ? Redness, swelling, or pain. ? Fluid or blood. ? Warmth. ? Pus or a bad smell. General instructions  Do not cross your legs. This may decrease blood flow to your feet.  Do not use heating pads or hot water bottles on your feet. They may burn your skin. If you have lost feeling in your feet or legs, you may not know this is happening until it is too late.  Protect your feet from hot and cold by wearing shoes, such as at the beach or on hot pavement.  Schedule a complete foot exam at least once a year (annually) or more often if you have foot problems. If you have foot problems, report any cuts, sores, or bruises to your health care provider immediately. Contact a health care provider if:  You have a medical condition that increases your risk of infection and you have any cuts, sores, or bruises on your feet.  You have an injury that is not healing.  You have redness on your legs or feet.  You feel burning or tingling in your  legs or feet.  You have pain or cramps in your legs and feet.  Your legs or feet are numb.  Your feet always feel cold.  You have pain around a toenail. Get help right away if:  You have a wound, scrape, corn, or callus on your foot and: ? You have pain, swelling, or redness that gets worse. ? You have fluid or blood coming from the wound, scrape, corn, or callus. ? Your wound, scrape, corn, or callus feels warm to the touch. ? You have pus or a bad smell coming from the wound, scrape, corn, or callus. ? You have a fever. ? You have a red line going up your leg. Summary  Check your feet every day for cuts, sores, red spots, swelling, and blisters.  Moisturize feet and legs daily.  Wear shoes that fit properly and have enough cushioning.  If you have foot problems, report any cuts, sores, or bruises to your health care provider immediately.  Schedule a complete foot exam at least once a year (annually) or more often if you have foot problems. This information is not intended to replace advice given to you by your health care provider. Make sure you discuss any questions you have with your health care provider. Document Revised: 07/03/2019 Document Reviewed: 11/11/2016 Elsevier Patient Education  [place].

## 2020-01-01 ENCOUNTER — Ambulatory Visit: Payer: Medicare Other | Admitting: Cardiology

## 2020-01-01 DIAGNOSIS — D631 Anemia in chronic kidney disease: Secondary | ICD-10-CM | POA: Diagnosis not present

## 2020-01-01 DIAGNOSIS — D509 Iron deficiency anemia, unspecified: Secondary | ICD-10-CM | POA: Diagnosis not present

## 2020-01-01 DIAGNOSIS — N2581 Secondary hyperparathyroidism of renal origin: Secondary | ICD-10-CM | POA: Diagnosis not present

## 2020-01-01 DIAGNOSIS — Z992 Dependence on renal dialysis: Secondary | ICD-10-CM | POA: Diagnosis not present

## 2020-01-01 DIAGNOSIS — E1122 Type 2 diabetes mellitus with diabetic chronic kidney disease: Secondary | ICD-10-CM | POA: Diagnosis not present

## 2020-01-01 DIAGNOSIS — N186 End stage renal disease: Secondary | ICD-10-CM | POA: Diagnosis not present

## 2020-01-01 NOTE — Telephone Encounter (Signed)
Called patient and no answer. Voicemail was left with office number.

## 2020-01-01 NOTE — Telephone Encounter (Signed)
Spoke with patient and advised results   

## 2020-01-02 ENCOUNTER — Other Ambulatory Visit: Payer: Self-pay | Admitting: *Deleted

## 2020-01-02 MED ORDER — DICLOFENAC SODIUM 1 % EX GEL: 2.0000 g | Freq: Three times a day (TID) | CUTANEOUS | 1 refills | Status: DC | PRN

## 2020-01-02 NOTE — Telephone Encounter (Signed)
Patient requested refill Faxed to pharmacy.  

## 2020-01-03 DIAGNOSIS — N186 End stage renal disease: Secondary | ICD-10-CM | POA: Diagnosis not present

## 2020-01-03 DIAGNOSIS — N2581 Secondary hyperparathyroidism of renal origin: Secondary | ICD-10-CM | POA: Diagnosis not present

## 2020-01-03 DIAGNOSIS — D631 Anemia in chronic kidney disease: Secondary | ICD-10-CM | POA: Diagnosis not present

## 2020-01-03 DIAGNOSIS — D509 Iron deficiency anemia, unspecified: Secondary | ICD-10-CM | POA: Diagnosis not present

## 2020-01-03 DIAGNOSIS — E1122 Type 2 diabetes mellitus with diabetic chronic kidney disease: Secondary | ICD-10-CM | POA: Diagnosis not present

## 2020-01-03 DIAGNOSIS — Z992 Dependence on renal dialysis: Secondary | ICD-10-CM | POA: Diagnosis not present

## 2020-01-03 LAB — WOUND CULTURE
MICRO NUMBER:: 10231030
SPECIMEN QUALITY:: ADEQUATE

## 2020-01-06 DIAGNOSIS — N186 End stage renal disease: Secondary | ICD-10-CM | POA: Diagnosis not present

## 2020-01-06 DIAGNOSIS — E1122 Type 2 diabetes mellitus with diabetic chronic kidney disease: Secondary | ICD-10-CM | POA: Diagnosis not present

## 2020-01-06 DIAGNOSIS — Z992 Dependence on renal dialysis: Secondary | ICD-10-CM | POA: Diagnosis not present

## 2020-01-06 DIAGNOSIS — N2581 Secondary hyperparathyroidism of renal origin: Secondary | ICD-10-CM | POA: Diagnosis not present

## 2020-01-06 DIAGNOSIS — D631 Anemia in chronic kidney disease: Secondary | ICD-10-CM | POA: Diagnosis not present

## 2020-01-06 DIAGNOSIS — D509 Iron deficiency anemia, unspecified: Secondary | ICD-10-CM | POA: Diagnosis not present

## 2020-01-07 ENCOUNTER — Ambulatory Visit (INDEPENDENT_AMBULATORY_CARE_PROVIDER_SITE_OTHER): Payer: Medicare Other | Admitting: Nurse Practitioner

## 2020-01-07 ENCOUNTER — Encounter: Payer: Self-pay | Admitting: Nurse Practitioner

## 2020-01-07 VITALS — BP 132/60 | HR 77 | Temp 97.9°F | Ht 72.0 in | Wt 175.0 lb

## 2020-01-07 DIAGNOSIS — K5520 Angiodysplasia of colon without hemorrhage: Secondary | ICD-10-CM

## 2020-01-07 DIAGNOSIS — K922 Gastrointestinal hemorrhage, unspecified: Secondary | ICD-10-CM

## 2020-01-07 NOTE — Progress Notes (Signed)
Assessment and plan noted ?

## 2020-01-07 NOTE — Patient Instructions (Signed)
.  If you are age 71 or older, your body mass index should be between 23-30. Your Body mass index is 23.73 kg/m. If this is out of the aforementioned range listed, please consider follow up with your Primary Care Provider.  If you are age 71 or younger, your body mass index should be between 19-25. Your Body mass index is 23.73 kg/m. If this is out of the aformentioned range listed, please consider follow up with your Primary Care Provider.   DECREASE Pantoprazole to once daily starting May 1st.  Follow up as needed.  Thank you for choosing me and [facility].   [name] Savoy, NP

## 2020-01-07 NOTE — Progress Notes (Signed)
IMPRESSION and PLAN:    Jacob [name] is a 69 y.o. male with a pmh significant for, not necessarily limited to HIV, ESRD on HD, HTN, DM, CAD / CABG / PCI February 2021, ? COPD, chronic anemia  # Upper GI bleed (melena) on anticoagulant / gastric AVMs.   --Secondary to gastric AVMs, status post EGD with epinephrine injection, APC and clip placement on 12/22/19.  Of note he had gastric/intestinal AVMs on EGD in April 2020 as well --Needs a total of 8 weeks of twice daily PPI.  He will continue twice daily pantoprazole through April then decrease dose to once daily the beginning of May --Patient still taking Brilinta, he will contact us if any recurrent overt GI bleeding  # acute on chronic anemia associated with upper GI bleed --After 1 unit of blood (inpatient) hemoglobin has remained stable around 8.3  # CAD / CABG / PCI / DES late February 2021. On Brillinta and ASA  # anal fissure, resolved  #Constipation, resolved --Continue Metamucil  # Unintentional weight loss --Down ~ 16 pounds since December --No focal GI symptoms --Inquires about a nutritional supplement.  Given he has ESRD I asked him to talk with nephrology about nutritional supplements.  He has some Glucerna at home which should be fine for diabetes but I am not sure if it is indicated in patients with renal failure  # colon cancer screening -Recall list for December 2023    HPI:    Primary GI: Dr. Henrene Pastor  Chief complaint : Dr. Henrene Pastor  **History comes from the chart and patient  Jacob [name] Is a 69 year old male who I saw last mid January for evaluation of rectal bleeding and constipation. Found to anal fissure on exam. Since then he was hospitalized late February  He has since been hospitalized with upper GI bleeding after starting ASA and Brillinta followed cardiac stent placement.  Inpatient EGD remarkable for angiodysplastic lesions in the stomach, 2 of which were actively bleeding.  Hemostasis achieved  with APC, epinephrine and clips. Baseline hgb hard to discern,  seems to be 8-9 range. Hgb declined to 7, he was given a unit of blood and hgb improved to low 8 range. He is here for follow up   Jacob [name] hasn't had any further GI bleeding.  He is still on pantoprazole twice daily.  He completed the Carafate.  No GI complaints, his bowels are moving well on Metamucil.  No rectal bleeding or rectal pain, he feels like the anal fissure has healed. He has unintentionally lost several pounds.  In December he was 191 pounds, today at 175 pounds.  Patient has no explanation for the weight loss.  No nausea, vomiting,  abdominal pain, or dysphagia   Data Reviewed:  EGD 12/22/2019 Normal esophagus. - Four bleeding angiodysplastic lesions in the stomach. Two were actively bleeding. All were successfully treated with a combination of APC, hemostatic clips and Epinephrien injection as outlined above. There was no bleeding at the conclusion of the exam. - Normal incisura, antrum and pylorus. - Normal duodenal bulb, first portion of the duodenum and second portion of the duodenum. - No specimens collected.  Review of systems:     No chest pain, no SOB, no fevers, no urinary sx   Past Medical History:  Diagnosis Date  . Acute respiratory failure ([place]) 03/01/2018  . Arthritis    "all over; mostly knees and back" (02/28/2018)  . CHF (congestive heart failure) (  [place])    not on any meds  . Chronic lower back pain    stenosis  . Community acquired pneumonia 09/06/2013  . COPD (chronic obstructive pulmonary disease) ([place])   . Coronary atherosclerosis of native coronary artery 2005   s/p surgery  . Drug abuse ([place])    hx; tested for cocaine as recently as 2/08. says he is not using drugs now - avoided defib. for this reason   . ESRD (end stage renal disease) ([place])    Hemo M-W-F- Richarda Blade  . GERD (gastroesophageal reflux disease)    takes OTC meds as needed  . GI bleeding 02/06/2019  . Glaucoma    uses eye drops  daily  . Hepatitis B 1968   "tx'd w/isolation; caught it from toilet stools in gym"  . History of blood transfusion 03/01/2019  . History of colon polyps    benign  . History of gout    takes Allopurinol daily as well as Colchicine-if needed (02/28/2018)  . History of kidney stones   . HTN (hypertension)    takes Coreg,Imdur.and Apresoline daily  . Human immunodeficiency virus (HIV) disease ([place]) dx'd 1995   takes Genvoya daily  . Hyperlipidemia    takes Atorvastatin daily  . Ischemic cardiomyopathy   . Muscle spasm    takes Zanaflex as needed  . Myocardial infarction ([place]) ~ 2004/2005  . Nocturia   . Peripheral neuropathy    takes gabapentin daily  . Pneumonia    "at least twice" (02/28/2018)  . Shortness of breath dyspnea    rarely but if notices it then with exertion  . Syphilis, unspecified   . Type II diabetes mellitus ([place]) 2004   Lantus daily.Average fasting blood sugar 125-199  . Wears glasses   . Wears partial dentures     Patient's surgical history, family medical history, social history, medications and allergies were all reviewed in Epic   Serum creatinine: 7.28 mg/dL (H) 12/23/19 0349 Estimated creatinine clearance: 10.4 mL/min (A)  Current Outpatient Medications  Medication Sig Dispense Refill  . acetaminophen (TYLENOL) 500 MG tablet Take 1,000 mg by mouth every 6 (six) hours as needed for moderate pain.     [name] allopurinol (ZYLOPRIM) 100 MG tablet TAKE 1 TABLET(100 MG) BY MOUTH DAILY 90 tablet 1  . ANORO ELLIPTA 62.5-25 MCG/INH AEPB INHALE 1 PUFF BY MOUTH EVERY DAY 60 each 5  . atorvastatin (LIPITOR) 80 MG tablet Take 1 tablet (80 mg total) by mouth daily. 90 tablet 3  . B-D UF III MINI PEN NEEDLES 31G X 5 MM MISC USE FOUR TIMES DAILY 100 each 11  . BIKTARVY 50-200-25 MG TABS tablet TAKE 1 TABLET BY MOUTH DAILY 30 tablet 5  . Choline Fenofibrate (FENOFIBRIC ACID) 135 MG CPDR TAKE 1 CAPSULE BY MOUTH DAILY 90 capsule 1  . colchicine 0.6 MG tablet Take 0.6 mg by  mouth daily as needed (pain).    [name] diclofenac Sodium (VOLTAREN) 1 % GEL Apply 2 g topically 3 (three) times daily as needed (pain). 100 g 1  . gabapentin (NEURONTIN) 300 MG capsule Take 1 capsule (300 mg total) by mouth daily. (Patient taking differently: Take 600 mg by mouth daily. )    . glucose blood test strip Use to test blood sugar three times daily. E11.40 300 each 3  . insulin lispro (HUMALOG) 100 UNIT/ML injection Inject 20 Units into the skin 3 (three) times daily as needed for high blood sugar (above 150).     [name]  isosorbide mononitrate (IMDUR) 30 MG 24 hr tablet TAKE 1 TABLET(30 MG) BY MOUTH DAILY 90 tablet 1  . Lancets (ONETOUCH DELICA PLUS EHOZYY48G) MISC Inject 1 Device as directed 3 (three) times daily. Dx: E11.40 300 each 3  . latanoprost (XALATAN) 0.005 % ophthalmic solution Place 1 drop into both eyes at bedtime.    . multivitamin (RENA-VIT) TABS tablet Take 1 tablet by mouth daily.    . nitroGLYCERIN (NITROSTAT) 0.3 MG SL tablet ONE TABLET UNDER TONGUE AS NEEDED FOR CHEST PAIN 25 tablet 4  . pantoprazole (PROTONIX) 40 MG tablet Take 1 tablet (40 mg total) by mouth 2 (two) times daily. 60 tablet 1  . polyethylene glycol (MIRALAX / GLYCOLAX) 17 g packet Take 17 g by mouth daily as needed for moderate constipation.    . sevelamer carbonate (RENVELA) 800 MG tablet Take 800 mg by mouth 3 (three) times daily with meals.    . simethicone (MYLICON) 80 MG chewable tablet Chew 1 tablet (80 mg total) by mouth 4 (four) times daily as needed for flatulence. 90 tablet 3  . ticagrelor (BRILINTA) 90 MG TABS tablet Take 1 tablet (90 mg total) by mouth 2 (two) times daily. 180 tablet 1  . timolol (BETIMOL) 0.5 % ophthalmic solution Apply to eye.    . timolol (TIMOPTIC) 0.5 % ophthalmic solution Place 1 drop into both eyes every morning.     No current facility-administered medications for this visit.    Physical Exam:     BP 132/60   Pulse 77   Temp 97.9 F (36.6 C)   Ht 6' (1.829 m)    Wt 175 lb (79.4 kg)   BMI 23.73 kg/m   GENERAL:  Pleasant male in NAD PSYCH: : Cooperative, normal affect CARDIAC:  RRR,  no peripheral edema PULM: Normal respiratory effort, lungs CTA bilaterally, no wheezing ABDOMEN:  Nondistended, soft, nontender. No obvious masses, no hepatomegaly,  normal bowel sounds SKIN:  turgor, no lesions seen Musculoskeletal:  Normal muscle tone, normal strength NEURO: Alert and oriented x 3, no focal neurologic deficits   [name] Savoy , NP 01/07/2020, 11:13 AM

## 2020-01-08 DIAGNOSIS — D631 Anemia in chronic kidney disease: Secondary | ICD-10-CM | POA: Diagnosis not present

## 2020-01-08 DIAGNOSIS — N2581 Secondary hyperparathyroidism of renal origin: Secondary | ICD-10-CM | POA: Diagnosis not present

## 2020-01-08 DIAGNOSIS — Z992 Dependence on renal dialysis: Secondary | ICD-10-CM | POA: Diagnosis not present

## 2020-01-08 DIAGNOSIS — N186 End stage renal disease: Secondary | ICD-10-CM | POA: Diagnosis not present

## 2020-01-08 DIAGNOSIS — E1122 Type 2 diabetes mellitus with diabetic chronic kidney disease: Secondary | ICD-10-CM | POA: Diagnosis not present

## 2020-01-08 DIAGNOSIS — D509 Iron deficiency anemia, unspecified: Secondary | ICD-10-CM | POA: Diagnosis not present

## 2020-01-09 ENCOUNTER — Encounter: Payer: Self-pay | Admitting: Podiatry

## 2020-01-09 ENCOUNTER — Ambulatory Visit (INDEPENDENT_AMBULATORY_CARE_PROVIDER_SITE_OTHER): Payer: Medicare Other | Admitting: Podiatry

## 2020-01-09 ENCOUNTER — Other Ambulatory Visit: Payer: Self-pay

## 2020-01-09 VITALS — BP 151/62 | HR 88 | Temp 96.6°F | Resp 14

## 2020-01-09 DIAGNOSIS — E11621 Type 2 diabetes mellitus with foot ulcer: Secondary | ICD-10-CM

## 2020-01-09 DIAGNOSIS — L97521 Non-pressure chronic ulcer of other part of left foot limited to breakdown of skin: Secondary | ICD-10-CM

## 2020-01-09 DIAGNOSIS — L97512 Non-pressure chronic ulcer of other part of right foot with fat layer exposed: Secondary | ICD-10-CM

## 2020-01-09 DIAGNOSIS — L84 Corns and callosities: Secondary | ICD-10-CM | POA: Diagnosis not present

## 2020-01-09 DIAGNOSIS — E1142 Type 2 diabetes mellitus with diabetic polyneuropathy: Secondary | ICD-10-CM | POA: Diagnosis not present

## 2020-01-09 MED ORDER — CIPROFLOXACIN HCL 500 MG PO TABS: 500.0000 mg | ORAL_TABLET | Freq: Every day | ORAL | 0 refills | Status: DC

## 2020-01-10 DIAGNOSIS — Z992 Dependence on renal dialysis: Secondary | ICD-10-CM | POA: Diagnosis not present

## 2020-01-10 DIAGNOSIS — N186 End stage renal disease: Secondary | ICD-10-CM | POA: Diagnosis not present

## 2020-01-10 DIAGNOSIS — D509 Iron deficiency anemia, unspecified: Secondary | ICD-10-CM | POA: Diagnosis not present

## 2020-01-10 DIAGNOSIS — D631 Anemia in chronic kidney disease: Secondary | ICD-10-CM | POA: Diagnosis not present

## 2020-01-10 DIAGNOSIS — E1122 Type 2 diabetes mellitus with diabetic chronic kidney disease: Secondary | ICD-10-CM | POA: Diagnosis not present

## 2020-01-10 DIAGNOSIS — N2581 Secondary hyperparathyroidism of renal origin: Secondary | ICD-10-CM | POA: Diagnosis not present

## 2020-01-13 DIAGNOSIS — E1122 Type 2 diabetes mellitus with diabetic chronic kidney disease: Secondary | ICD-10-CM | POA: Diagnosis not present

## 2020-01-13 DIAGNOSIS — D509 Iron deficiency anemia, unspecified: Secondary | ICD-10-CM | POA: Diagnosis not present

## 2020-01-13 DIAGNOSIS — N2581 Secondary hyperparathyroidism of renal origin: Secondary | ICD-10-CM | POA: Diagnosis not present

## 2020-01-13 DIAGNOSIS — D631 Anemia in chronic kidney disease: Secondary | ICD-10-CM | POA: Diagnosis not present

## 2020-01-13 DIAGNOSIS — Z992 Dependence on renal dialysis: Secondary | ICD-10-CM | POA: Diagnosis not present

## 2020-01-13 DIAGNOSIS — N186 End stage renal disease: Secondary | ICD-10-CM | POA: Diagnosis not present

## 2020-01-13 NOTE — Progress Notes (Signed)
Subjective: 69 year old male presents the office today for evaluation of a wound on his left big toe as well as the right submetatarsal area. He feels the wound is doing better and he is not seeing any drainage or pus.  No increase in swelling no redness.  Denies any systemic complaints such as fevers, chills, nausea, vomiting. No acute changes since last appointment, and no other complaints at this time.   Objective: AAO x3, NAD DP/PT pulses palpable bilaterally, CRT less than 3 seconds Preulcerative area on the left hallux but no definitive skin breakdown identified.  On the right foot second tarsal hyperkeratotic lesion with a central superficial wound.  There is no probing, undermining or tunneling.  There is no surrounding erythema, ascending cellulitis.  No fluctuation crepitation.  Wound appears to be almost healed. No open lesions or pre-ulcerative lesions.  No pain with calf compression, swelling, warmth, erythema  Assessment: Ulceration right foot with preulcerative area left hallux  Plan: -All treatment options discussed with the patient including all alternatives, risks, complications.  -Debrided the wound today utilizing #312 blade scalpel to remove the hyperkeratotic and nonviable tissue.  Continue daily dressing changes.  Reviewed the culture.  Prescribed ciprofloxacin daily. -Offloading the left hallux and monitoring skin breakdown. -Remain in the surgical shoe on the right foot -Monitor for any clinical signs or symptoms of infection and directed to call the office immediately should any occur or go to the ER. -Patient encouraged to call the office with any questions, concerns, change in symptoms.   Return in about 1 week (around 01/16/2020).  Wound check  Trula Slade DPM

## 2020-01-15 DIAGNOSIS — N2581 Secondary hyperparathyroidism of renal origin: Secondary | ICD-10-CM | POA: Diagnosis not present

## 2020-01-15 DIAGNOSIS — N186 End stage renal disease: Secondary | ICD-10-CM | POA: Diagnosis not present

## 2020-01-15 DIAGNOSIS — D631 Anemia in chronic kidney disease: Secondary | ICD-10-CM | POA: Diagnosis not present

## 2020-01-15 DIAGNOSIS — E1122 Type 2 diabetes mellitus with diabetic chronic kidney disease: Secondary | ICD-10-CM | POA: Diagnosis not present

## 2020-01-15 DIAGNOSIS — Z992 Dependence on renal dialysis: Secondary | ICD-10-CM | POA: Diagnosis not present

## 2020-01-15 DIAGNOSIS — D509 Iron deficiency anemia, unspecified: Secondary | ICD-10-CM | POA: Diagnosis not present

## 2020-01-16 ENCOUNTER — Telehealth (HOSPITAL_COMMUNITY): Payer: Self-pay

## 2020-01-16 ENCOUNTER — Encounter: Payer: Self-pay | Admitting: Podiatry

## 2020-01-16 ENCOUNTER — Ambulatory Visit (INDEPENDENT_AMBULATORY_CARE_PROVIDER_SITE_OTHER): Payer: Medicare Other | Admitting: Podiatry

## 2020-01-16 ENCOUNTER — Telehealth: Payer: Self-pay | Admitting: Nurse Practitioner

## 2020-01-16 ENCOUNTER — Other Ambulatory Visit: Payer: Self-pay

## 2020-01-16 ENCOUNTER — Telehealth: Payer: Self-pay | Admitting: Cardiovascular Disease

## 2020-01-16 DIAGNOSIS — L97512 Non-pressure chronic ulcer of other part of right foot with fat layer exposed: Secondary | ICD-10-CM | POA: Diagnosis not present

## 2020-01-16 DIAGNOSIS — E1142 Type 2 diabetes mellitus with diabetic polyneuropathy: Secondary | ICD-10-CM | POA: Diagnosis not present

## 2020-01-16 DIAGNOSIS — L84 Corns and callosities: Secondary | ICD-10-CM

## 2020-01-16 NOTE — Telephone Encounter (Signed)
Pt insurance is active and benefits verified through [facility] Medicare Co-pay 0, DED 0/0 met, out of pocket $7,550/$2,614.12 met, co-insurance 20%. no pre-authorization required. Passport, 01/16/2020'@10' [place], REF# [id number]  Will contact patient to see if he is interested in the Cardiac Rehab Program. If interested, patient will need to complete follow up appt. Once completed, patient will be contacted for scheduling upon review by the RN Navigator.

## 2020-01-16 NOTE — Telephone Encounter (Signed)
Patient states he is calling to inquire about whether it is necessary for him to continue taking medication he was prescribed post procedure, ticagrelor (BRILINTA) 90 MG TABS tablet or if he needs to go back to taking Aspirin. Please advise.

## 2020-01-16 NOTE — Telephone Encounter (Signed)
Left message for patient to call back  

## 2020-01-16 NOTE — Telephone Encounter (Signed)
Called patient back. No answer. Got his voicemail. I left him a message instructing him to contact prescribing provider and thought that would be his cardiologist. Instructed to continue Pantoprazole as discussed at his office visit.   He will continue twice daily pantoprazole through April then decrease dose to once daily the beginning of May. Is this okay?

## 2020-01-16 NOTE — Telephone Encounter (Signed)
Brilinta for 6 months then can change to plavix 75 mg generic daily

## 2020-01-17 ENCOUNTER — Other Ambulatory Visit: Payer: Self-pay | Admitting: Nurse Practitioner

## 2020-01-17 DIAGNOSIS — Z992 Dependence on renal dialysis: Secondary | ICD-10-CM | POA: Diagnosis not present

## 2020-01-17 DIAGNOSIS — E1122 Type 2 diabetes mellitus with diabetic chronic kidney disease: Secondary | ICD-10-CM | POA: Diagnosis not present

## 2020-01-17 DIAGNOSIS — N2581 Secondary hyperparathyroidism of renal origin: Secondary | ICD-10-CM | POA: Diagnosis not present

## 2020-01-17 DIAGNOSIS — M109 Gout, unspecified: Secondary | ICD-10-CM

## 2020-01-17 DIAGNOSIS — N186 End stage renal disease: Secondary | ICD-10-CM | POA: Diagnosis not present

## 2020-01-17 DIAGNOSIS — D631 Anemia in chronic kidney disease: Secondary | ICD-10-CM | POA: Diagnosis not present

## 2020-01-17 DIAGNOSIS — D509 Iron deficiency anemia, unspecified: Secondary | ICD-10-CM | POA: Diagnosis not present

## 2020-01-17 MED ORDER — TICAGRELOR 90 MG PO TABS: 90.0000 mg | ORAL_TABLET | Freq: Two times a day (BID) | ORAL | 3 refills | Status: DC

## 2020-01-17 NOTE — Telephone Encounter (Signed)
Yes, thanks [employer]

## 2020-01-17 NOTE — Telephone Encounter (Signed)
Follow up  Pt returning call   Please call

## 2020-01-17 NOTE — Telephone Encounter (Signed)
Called patient back with Dr. Kyla Balzarine message. Patient stated he would need a refill if he needs to stay on Brilinta. Sent in refill to patient's pharmacy.

## 2020-01-20 DIAGNOSIS — Z992 Dependence on renal dialysis: Secondary | ICD-10-CM | POA: Diagnosis not present

## 2020-01-20 DIAGNOSIS — N2581 Secondary hyperparathyroidism of renal origin: Secondary | ICD-10-CM | POA: Diagnosis not present

## 2020-01-20 DIAGNOSIS — E1122 Type 2 diabetes mellitus with diabetic chronic kidney disease: Secondary | ICD-10-CM | POA: Diagnosis not present

## 2020-01-20 DIAGNOSIS — D631 Anemia in chronic kidney disease: Secondary | ICD-10-CM | POA: Diagnosis not present

## 2020-01-20 DIAGNOSIS — D509 Iron deficiency anemia, unspecified: Secondary | ICD-10-CM | POA: Diagnosis not present

## 2020-01-20 DIAGNOSIS — N186 End stage renal disease: Secondary | ICD-10-CM | POA: Diagnosis not present

## 2020-01-20 NOTE — Progress Notes (Signed)
Subjective: 69 year old male presents the office today for evaluation of a wound on his left big toe as well as the right submetatarsal as well as along the left medial hallux.  He states the areas are doing much better and he is eager to get back into regular shoe.  Previously had some drainage but he feels the area is closed up and not having any drainage currently.  No increase in swelling or any redness of the foot. Denies any systemic complaints such as fevers, chills, nausea, vomiting. No acute changes since last appointment, and no other complaints at this time.   Objective: AAO x3, NAD DP/PT pulses palpable bilaterally, CRT less than 3 seconds Preulcerative area on the left hallux but no definitive skin breakdown identified.  On the right foot second tarsal hyperkeratotic lesion and after debridement the wound appears to be healed but it is still preulcerative.  There was some dried blood present within the callus.  There is prominence of metatarsal heads plantarly actually the right foot.  No itching, erythema and signs of infection.   No open lesions or pre-ulcerative lesions.  No pain with calf compression, swelling, warmth, erythema  Assessment: Healed ulceration  Plan: -All treatment options discussed with the patient including all alternatives, risks, complications.  -Debrided hyperkeratotic tissue without any complications or bleeding.  Appears that the wounds are healed.  Slowly transition back into regular shoe but monitor closely for any skin breakdown or reoccurrence of the ulceration.  Continue the bandage on the area daily for offloading as well. -Monitor for any clinical signs or symptoms of infection and directed to call the office immediately should any occur or go to the ER.  RTC 2 weeks or sooner if needed  Trula Slade DPM

## 2020-01-22 ENCOUNTER — Ambulatory Visit: Payer: Medicare Other | Admitting: Gastroenterology

## 2020-01-22 DIAGNOSIS — I129 Hypertensive chronic kidney disease with stage 1 through stage 4 chronic kidney disease, or unspecified chronic kidney disease: Secondary | ICD-10-CM | POA: Diagnosis not present

## 2020-01-22 DIAGNOSIS — E1122 Type 2 diabetes mellitus with diabetic chronic kidney disease: Secondary | ICD-10-CM | POA: Diagnosis not present

## 2020-01-22 DIAGNOSIS — D631 Anemia in chronic kidney disease: Secondary | ICD-10-CM | POA: Diagnosis not present

## 2020-01-22 DIAGNOSIS — N186 End stage renal disease: Secondary | ICD-10-CM | POA: Diagnosis not present

## 2020-01-22 DIAGNOSIS — D509 Iron deficiency anemia, unspecified: Secondary | ICD-10-CM | POA: Diagnosis not present

## 2020-01-22 DIAGNOSIS — N2581 Secondary hyperparathyroidism of renal origin: Secondary | ICD-10-CM | POA: Diagnosis not present

## 2020-01-22 DIAGNOSIS — Z992 Dependence on renal dialysis: Secondary | ICD-10-CM | POA: Diagnosis not present

## 2020-01-24 DIAGNOSIS — E1122 Type 2 diabetes mellitus with diabetic chronic kidney disease: Secondary | ICD-10-CM | POA: Diagnosis not present

## 2020-01-24 DIAGNOSIS — N186 End stage renal disease: Secondary | ICD-10-CM | POA: Diagnosis not present

## 2020-01-24 DIAGNOSIS — D631 Anemia in chronic kidney disease: Secondary | ICD-10-CM | POA: Diagnosis not present

## 2020-01-24 DIAGNOSIS — N2581 Secondary hyperparathyroidism of renal origin: Secondary | ICD-10-CM | POA: Diagnosis not present

## 2020-01-24 DIAGNOSIS — Z992 Dependence on renal dialysis: Secondary | ICD-10-CM | POA: Diagnosis not present

## 2020-01-24 DIAGNOSIS — D509 Iron deficiency anemia, unspecified: Secondary | ICD-10-CM | POA: Diagnosis not present

## 2020-01-27 DIAGNOSIS — N2581 Secondary hyperparathyroidism of renal origin: Secondary | ICD-10-CM | POA: Diagnosis not present

## 2020-01-27 DIAGNOSIS — D631 Anemia in chronic kidney disease: Secondary | ICD-10-CM | POA: Diagnosis not present

## 2020-01-27 DIAGNOSIS — E1122 Type 2 diabetes mellitus with diabetic chronic kidney disease: Secondary | ICD-10-CM | POA: Diagnosis not present

## 2020-01-27 DIAGNOSIS — Z992 Dependence on renal dialysis: Secondary | ICD-10-CM | POA: Diagnosis not present

## 2020-01-27 DIAGNOSIS — N186 End stage renal disease: Secondary | ICD-10-CM | POA: Diagnosis not present

## 2020-01-27 DIAGNOSIS — D509 Iron deficiency anemia, unspecified: Secondary | ICD-10-CM | POA: Diagnosis not present

## 2020-01-28 ENCOUNTER — Encounter: Payer: Self-pay | Admitting: Family

## 2020-01-28 ENCOUNTER — Ambulatory Visit (INDEPENDENT_AMBULATORY_CARE_PROVIDER_SITE_OTHER): Payer: Medicare Other | Admitting: Family

## 2020-01-28 ENCOUNTER — Other Ambulatory Visit: Payer: Self-pay

## 2020-01-28 VITALS — BP 128/72 | HR 83 | Temp 97.5°F | Ht 72.0 in | Wt 174.5 lb

## 2020-01-28 DIAGNOSIS — N186 End stage renal disease: Secondary | ICD-10-CM | POA: Diagnosis not present

## 2020-01-28 DIAGNOSIS — M109 Gout, unspecified: Secondary | ICD-10-CM

## 2020-01-28 DIAGNOSIS — Z794 Long term (current) use of insulin: Secondary | ICD-10-CM

## 2020-01-28 DIAGNOSIS — I10 Essential (primary) hypertension: Secondary | ICD-10-CM | POA: Diagnosis not present

## 2020-01-28 DIAGNOSIS — E785 Hyperlipidemia, unspecified: Secondary | ICD-10-CM

## 2020-01-28 DIAGNOSIS — E1140 Type 2 diabetes mellitus with diabetic neuropathy, unspecified: Secondary | ICD-10-CM | POA: Diagnosis not present

## 2020-01-28 DIAGNOSIS — E1169 Type 2 diabetes mellitus with other specified complication: Secondary | ICD-10-CM

## 2020-01-28 DIAGNOSIS — J449 Chronic obstructive pulmonary disease, unspecified: Secondary | ICD-10-CM

## 2020-01-28 DIAGNOSIS — K5901 Slow transit constipation: Secondary | ICD-10-CM

## 2020-01-28 DIAGNOSIS — Z992 Dependence on renal dialysis: Secondary | ICD-10-CM

## 2020-01-28 DIAGNOSIS — D638 Anemia in other chronic diseases classified elsewhere: Secondary | ICD-10-CM

## 2020-01-28 DIAGNOSIS — I5042 Chronic combined systolic (congestive) and diastolic (congestive) heart failure: Secondary | ICD-10-CM

## 2020-01-28 NOTE — Progress Notes (Signed)
Provider: Jamelle Noy FNP-C   [name], NP  Patient Care Team: [name], NP as PCP - General (Geriatric Medicine) [name], MD as PCP - Infectious Diseases (Infectious Diseases) [name], MD as PCP - Cardiology (Cardiology) [name], MD as Consulting Physician (Ophthalmology) [name], MD as Consulting Physician (Cardiology) [name], MD as Consulting Physician (Infectious Diseases) Gardiner Barefoot, DPM as Consulting Physician (Podiatry) Joesphine Schemm, Nelda Bucks, NP as Nurse Practitioner (Family Medicine) [place], [name]  Extended Emergency Contact Information Primary Emergency Contact: Bertrum Sol Address: [address]          [place], [place] [address] [place] Phone: [phone] Mobile Phone: [phone] Relation: Friend Secondary Emergency Contact: Alda Berthold [place] Phone: [phone] Relation: Sister  Code Status:Full code  Goals of care: Advanced Directive information Advanced Directives 01/28/2020  Does Patient Have a Medical Advance Directive? Yes  Type of Paramedic of [place];Living will  Does patient want to make changes to medical advance directive? No - Patient declined  Copy of [place] in Chart? Yes - validated most recent copy scanned in chart (See row information)  Would patient like information on creating a medical advance directive? -     Chief Complaint  Patient presents with  . Medical Management of Chronic Issues    3 month follow up     HPI:  Pt is a 69 y.o. male seen today for 3 month follow up for medical management of chronic diseases.Hedenies any acute issues.He states right foot plantar and left great toe ulcer resolved.He continues to follow up with Podiatrist Celesta Gentile.He wears diabetic shoes.Has numbness and tingling on feet.States Gabapentin was switched to twice daily by podiatrist.    He is status post hospitalization 12/19/2019 -12/23/2019 for episodes of dyspnea.He had cardiac cath with PCI/DES x 1 to SVG-Ramus,patent SVG-RCA.He also had GI bleed/Anemia and ASA was stopped.He was continued on Brilinta,Statin and fenofibrate.His Hgb dropped from 10 > 7.3.Cath site was stable.He had positive Hem stool. He received one unit of PRBC with much improvement of Hgb.GI was consulted and he underwent EGD which showed 4 arteriovenous malformation (AVMs)/2 bleeding which was treated with APC,clips and Epi injection on 12/22/2019. Protonix 40 mg tablet twice daily x 8 weeks then 40 mg tablet daily was recommended.Sucralfate suspension 1 gram twice daily x 2 weeks was also given.He follows up with GI specialist at [facility]. He denies any dark colored stool or bleeding today.   Type 2 DM - brought his home blood sugar log to visit.mornig CBG 120's-170's; Lunch 110's -160's and Dinner 90's -190's.He reports x 1 episodes of feeling shaky but drank apple juices and felt better.Also has a glucose tablet if blood sugars are low.On Humalog 20 units three times as needed if CBG > 150  Has seen Podiatrist 10/2019 and has up coming appointment.Also follows up with Ophthalmologist for eye exam.     CHF - denies any signs of fluid overload.latest ECHO 40 -45 % status post Cath.on Imdur 30 mg 24 Hr tablet daily.    Hyperlipidemia - no home blood pressure for review.He denies any chest pain.on Imdur and ASA. No signs of hypotension reported.   Hyperlipidemia - latest LDL reviewed 29,Chol 65,TRG 95 ( 12/21/2019).on atorvastatin 80 mg tablet daily and Fenofibrate 135 mg capsule daily.states on a renal diet.  ESRD - on dialysis M,W,Fri and continues to follow up with Dr.Upton.Has left AV fistula.on Renvela  800 mg tablet three times daily with meals.   GERD - status post GI bleed post one unit PRBC transfusion as above. Reports no more bleeding.continue to follow up with Gastroenterologist.on  Protonix 40 mg tablet twice daily x 8 weeks then 40 mg tablet daily.   Gout - reports no flare ups.on Allopurinol 100 mg tablet daily.Also on Colchicine 0.6 mg tablet daily as needed.   COPD - states no worsening cough or sputum production or wheezing.on Anoro Ellipta one puff daily.   Reports no fall episodes. His weight stable.    Past Medical History:  Diagnosis Date  . Acute respiratory failure ([place]) 03/01/2018  . Arthritis    "all over; mostly knees and back" (02/28/2018)  . CHF (congestive heart failure) (HCC)    not on any meds  . Chronic lower back pain    stenosis  . Community acquired pneumonia 09/06/2013  . COPD (chronic obstructive pulmonary disease) ([place])   . Coronary atherosclerosis of native coronary artery 2005   s/p surgery  . Drug abuse ([place])    hx; tested for cocaine as recently as 2/08. says he is not using drugs now - avoided defib. for this reason   . ESRD (end stage renal disease) ([place])    Hemo M-W-F- Richarda Blade  . GERD (gastroesophageal reflux disease)    takes OTC meds as needed  . GI bleeding 02/06/2019  . Glaucoma    uses eye drops daily  . Hepatitis B 1968   "tx'd w/isolation; caught it from toilet stools in gym"  . History of blood transfusion 03/01/2019  . History of colon polyps    benign  . History of gout    takes Allopurinol daily as well as Colchicine-if needed (02/28/2018)  . History of kidney stones   . HTN (hypertension)    takes Coreg,Imdur.and Apresoline daily  . Human immunodeficiency virus (HIV) disease ([place]) dx'd 1995   takes Genvoya daily  . Hyperlipidemia    takes Atorvastatin daily  . Ischemic cardiomyopathy   . Muscle spasm    takes Zanaflex as needed  . Myocardial infarction ([place]) ~ 2004/2005  . Nocturia   . Peripheral neuropathy    takes gabapentin daily  . Pneumonia    "at least twice" (02/28/2018)  . Shortness of breath dyspnea    rarely but if notices it then with exertion  . Syphilis, unspecified   . Type II diabetes  mellitus ([place]) 2004   Lantus daily.Average fasting blood sugar 125-199  . Wears glasses   . Wears partial dentures    Past Surgical History:  Procedure Laterality Date  . AV FISTULA PLACEMENT Left 08/02/2018   Procedure: ARTERIOVENOUS (AV) FISTULA CREATION  left arm radiocephlic;  Surgeon: Marty Heck, MD;  Location: [facility];  Service: Vascular;  Laterality: Left;  . AV FISTULA PLACEMENT Left 08/01/2019   Procedure: LEFT BRACHIOCEPHALIC ARTERIOVENOUS (AV) FISTULA CREATION;  Surgeon: [name], MD;  Location: [facility];  Service: Vascular;  Laterality: Left;  . BASCILIC VEIN TRANSPOSITION Left 10/03/2019   Procedure: BASILIC VEIN TRANSPOSITION LEFT SECOND STAGE;  Surgeon: [name], MD;  Location: [facility];  Service: Vascular;  Laterality: Left;  . CARDIAC CATHETERIZATION  10/2002; 12/19/2004   [name] 03/08/2011  . COLONOSCOPY  2013   Dr.John Henrene Pastor   . CORONARY ARTERY BYPASS GRAFT  02/24/2003   CABG X2/notes 03/08/2011  . CORONARY STENT INTERVENTION N/A 12/19/2019   Procedure: CORONARY STENT INTERVENTION;  Surgeon: Jettie Booze, MD;  Location: [facility];  Service: Cardiovascular;  Laterality: N/A;  . ESOPHAGOGASTRODUODENOSCOPY (EGD) WITH PROPOFOL N/A 02/08/2019   Procedure: ESOPHAGOGASTRODUODENOSCOPY (EGD) WITH PROPOFOL;  Surgeon: Milus Banister, MD;  Location: [facility];  Service: Gastroenterology;  Laterality: N/A;  . ESOPHAGOGASTRODUODENOSCOPY (EGD) WITH PROPOFOL N/A 12/22/2019   Procedure: ESOPHAGOGASTRODUODENOSCOPY (EGD) WITH PROPOFOL;  Surgeon: Lavena Bullion, DO;  Location: [facility];  Service: Gastroenterology;  Laterality: N/A;  . HEMOSTASIS CLIP PLACEMENT  12/22/2019   Procedure: HEMOSTASIS CLIP PLACEMENT;  Surgeon: Lavena Bullion, DO;  Location: [facility];  Service: Gastroenterology;;  . HOT HEMOSTASIS N/A 02/08/2019   Procedure: HOT HEMOSTASIS (ARGON PLASMA COAGULATION/BICAP);  Surgeon: Milus Banister, MD;  Location[facility];  Service:  Gastroenterology;  Laterality: N/A;  . HOT HEMOSTASIS N/A 12/22/2019   Procedure: HOT HEMOSTASIS (ARGON PLASMA COAGULATION/BICAP);  Surgeon: Lavena Bullion, DO;  Location: [facility] ENDOSCOPY;  Service: Gastroenterology;  Laterality: N/A;  . INTERTROCHANTERIC HIP FRACTURE SURGERY Left 11/2006   [name] 03/08/2011  . INTRAVASCULAR ULTRASOUND/IVUS N/A 12/19/2019   Procedure: Intravascular Ultrasound/IVUS;  Surgeon: Jettie Booze, MD;  Location: [facility];  Service: Cardiovascular;  Laterality: N/A;  . IR FLUORO GUIDE CV LINE RIGHT  07/24/2019  . IR FLUORO GUIDE CV LINE RIGHT  07/30/2019  . IR US GUIDE VASC ACCESS RIGHT  07/24/2019  . IR US GUIDE VASC ACCESS RIGHT  07/30/2019  . LAPAROSCOPIC CHOLECYSTECTOMY  05/2006  . LIGATION OF COMPETING BRANCHES OF ARTERIOVENOUS FISTULA Left 11/05/2018   Procedure: LIGATION OF COMPETING BRANCHES OF ARTERIOVENOUS FISTULA  LEFT  ARM;  Surgeon: Marty Heck, MD;  Location: [facility];  Service: Vascular;  Laterality: Left;  . LUMBAR LAMINECTOMY/DECOMPRESSION MICRODISCECTOMY N/A 02/29/2016   Procedure: Left L4-5 Lateral Recess Decompression, Removal Extradural Intraspinal Facet Cyst;  Surgeon: Marybelle Killings, MD;  Location: [facility];  Service: Orthopedics;  Laterality: N/A;  . MULTIPLE TOOTH EXTRACTIONS    . ORIF MANDIBULAR FRACTURE Left 08/13/2004   ORIF of left body fracture mandible with KLS Martin 2.3-mm six hole/notes 03/08/2011  . RIGHT/LEFT HEART CATH AND CORONARY/GRAFT ANGIOGRAPHY N/A 12/19/2019   Procedure: RIGHT/LEFT HEART CATH AND CORONARY/GRAFT ANGIOGRAPHY;  Surgeon: Jettie Booze, MD;  Location: [facility];  Service: Cardiovascular;  Laterality: N/A;  . SCLEROTHERAPY  12/22/2019   Procedure: SCLEROTHERAPY;  Surgeon: Lavena Bullion, DO;  Location: [facility];  Service: Gastroenterology;;    Allergies  Allergen Reactions  . Augmentin [Amoxicillin-Pot Clavulanate] Diarrhea    Severe  . Amphetamines Other (See Comments)     Unknown reaction type    Allergies as of 01/28/2020      Reactions   Augmentin [amoxicillin-pot Clavulanate] Diarrhea   Severe   Amphetamines Other (See Comments)   Unknown reaction type      Medication List       Accurate as of January 28, 2020  1:05 PM. If you have any questions, ask your nurse or doctor.        STOP taking these medications   ciprofloxacin 500 MG tablet Commonly known as: Cipro Stopped by: [name], NP     TAKE these medications   acetaminophen 500 MG tablet Commonly known as: TYLENOL Take 1,000 mg by mouth every 6 (six) hours as needed for moderate pain.   allopurinol 100 MG tablet Commonly known as: ZYLOPRIM TAKE 1 TABLET(100 MG) BY MOUTH DAILY   Anoro Ellipta 62.5-25 MCG/INH Aepb Generic drug: umeclidinium-vilanterol INHALE 1 PUFF BY MOUTH EVERY DAY  atorvastatin 80 MG tablet Commonly known as: LIPITOR Take 1 tablet (80 mg total) by mouth daily.   B-D UF III MINI PEN NEEDLES 31G X 5 MM Misc Generic drug: Insulin Pen Needle USE FOUR TIMES DAILY   Betimol 0.5 % ophthalmic solution Generic drug: timolol Apply to eye.   Biktarvy 50-200-25 MG Tabs tablet Generic drug: bictegravir-emtricitabine-tenofovir AF TAKE 1 TABLET BY MOUTH DAILY   colchicine 0.6 MG tablet Take 0.6 mg by mouth daily as needed (pain).   diclofenac Sodium 1 % Gel Commonly known as: VOLTAREN Apply 2 g topically 3 (three) times daily as needed (pain).   Fenofibric Acid 135 MG Cpdr TAKE 1 CAPSULE BY MOUTH DAILY   gabapentin 300 MG capsule Commonly known as: NEURONTIN Take 1 capsule (300 mg total) by mouth daily. What changed: when to take this   glucose blood test strip Use to test blood sugar three times daily. E11.40   insulin lispro 100 UNIT/ML injection Commonly known as: HUMALOG Inject 20 Units into the skin 3 (three) times daily as needed for high blood sugar (above 150).   isosorbide mononitrate 30 MG 24 hr tablet Commonly known as: IMDUR TAKE 1  TABLET(30 MG) BY MOUTH DAILY   latanoprost 0.005 % ophthalmic solution Commonly known as: XALATAN Place 1 drop into both eyes at bedtime.   multivitamin Tabs tablet Take 1 tablet by mouth daily.   nitroGLYCERIN 0.3 MG SL tablet Commonly known as: NITROSTAT ONE TABLET UNDER TONGUE AS NEEDED FOR CHEST PAIN   OneTouch Delica Plus [id number] Misc Inject 1 Device as directed 3 (three) times daily. Dx: E11.40   pantoprazole 40 MG tablet Commonly known as: PROTONIX Take 1 tablet (40 mg total) by mouth 2 (two) times daily.   polyethylene glycol 17 g packet Commonly known as: MIRALAX / GLYCOLAX Take 17 g by mouth daily as needed for moderate constipation.   sevelamer carbonate 800 MG tablet Commonly known as: RENVELA Take 800 mg by mouth 3 (three) times daily with meals.   simethicone 80 MG chewable tablet Commonly known as: MYLICON Chew 1 tablet (80 mg total) by mouth 4 (four) times daily as needed for flatulence.   ticagrelor 90 MG Tabs tablet Commonly known as: BRILINTA Take 1 tablet (90 mg total) by mouth 2 (two) times daily.   timolol 0.5 % ophthalmic solution Commonly known as: TIMOPTIC Place 1 drop into both eyes every morning.       Review of Systems  Constitutional: Negative for appetite change, chills, fatigue and fever.  HENT: Negative for congestion, rhinorrhea, sinus pressure, sinus pain, sneezing, sore throat and trouble swallowing.   Eyes: Positive for visual disturbance. Negative for discharge, redness and itching.       Wears eye glasses   Respiratory: Negative for cough, chest tightness, shortness of breath and wheezing.   Cardiovascular: Negative for chest pain, palpitations and leg swelling.  Gastrointestinal: Positive for constipation. Negative for abdominal distention, abdominal pain, diarrhea, nausea and vomiting.       Metamucil   Endocrine: Negative for cold intolerance, heat intolerance, polydipsia, polyphagia and polyuria.  Genitourinary: Negative  for flank pain and urgency.       On Hemo dialysis M,W,Fri  Musculoskeletal: Negative for arthralgias, gait problem and myalgias.  Skin: Negative for color change, pallor, rash and wound.  Neurological: Positive for numbness. Negative for dizziness, speech difficulty, weakness, light-headedness and headaches.  Hematological: Does not bruise/bleed easily.  Psychiatric/Behavioral: Negative for agitation, decreased concentration and sleep disturbance. The patient  is not nervous/anxious.        Sleeps 4-5 hours at night on dialysis days.     Immunization History  Administered Date(s) Administered  . Fluad Quad(high Dose 65+) 06/14/2019  . Hepatitis B 08/28/2006, 10/02/2007, 04/01/2008  . Hepatitis B, adult 06/03/2014, 07/04/2014  . Influenza Split 07/28/2011  . Influenza Whole 08/28/2006, 09/10/2007, 09/15/2008, 08/03/2009, 07/26/2010  . Influenza, High Dose Seasonal PF 07/04/2018  . Influenza,inj,Quad PF,6+ Mos 07/04/2014, 07/06/2015, 07/12/2016, 07/11/2017  . Influenza-Unspecified 06/24/2013  . PFIZER SARS-COV-2 Vaccination 11/16/2019  . Pneumococcal Conjugate-13 08/18/2016  . Pneumococcal Polysaccharide-23 08/28/2006, 07/28/2011, 05/30/2018  . Tdap 06/14/2019  . Zoster 06/03/2014  . Zoster Recombinat (Shingrix) 07/24/2017, 01/09/2018   Pertinent  Health Maintenance Due  Topic Date Due  . HEMOGLOBIN A1C  02/07/2020  . INFLUENZA VACCINE  05/24/2020  . OPHTHALMOLOGY EXAM  10/07/2020  . LIPID PANEL  12/20/2020  . FOOT EXAM  12/30/2020  . COLONOSCOPY  10/01/2022  . PNA vac Low Risk Adult  Completed   Fall Risk  01/28/2020 11/19/2019 10/29/2019 08/15/2019 08/06/2019  Falls in the past year? 0 0 0 0 0  Comment - - - - -  Number falls in past yr: 0 0 0 0 0  Comment - - - - -  Injury with Fall? 0 0 0 0 0  Comment - - - - -  Risk Factor Category  - - - - -  Risk for fall due to : - - - - -  Risk for fall due to: Comment - - - - -  Follow up - - - - -    Vitals:   01/28/20 1249    BP: 128/72  Pulse: 83  Temp: (!) 97.5 F (36.4 C)  TempSrc: Temporal  SpO2: 98%  Weight: 174 lb 8 oz (79.2 kg)  Height: 6' (1.829 m)   Body mass index is 23.67 kg/m. Physical Exam Vitals reviewed.  Constitutional:      General: He is not in acute distress.    Appearance: He is not ill-appearing.  HENT:     Head: Normocephalic.     Right Ear: Tympanic membrane, ear canal and external ear normal. There is no impacted cerumen.     Left Ear: Tympanic membrane, ear canal and external ear normal. There is no impacted cerumen.     Nose: Nose normal. No congestion or rhinorrhea.     Mouth/Throat:     Mouth: Mucous membranes are moist.     Pharynx: Oropharynx is clear. No oropharyngeal exudate or posterior oropharyngeal erythema.  Eyes:     General: No scleral icterus.       Right eye: No discharge.        Left eye: No discharge.     Extraocular Movements: Extraocular movements intact.     Conjunctiva/sclera: Conjunctivae normal.     Pupils: Pupils are equal, round, and reactive to light.     Comments: corrective lens in place   Neck:     Vascular: No carotid bruit.  Cardiovascular:     Rate and Rhythm: Normal rate and regular rhythm.     Pulses: Normal pulses.     Heart sounds: Normal heart sounds. No murmur. No friction rub. No gallop.      Comments: Left arm AV fistula thrill and bruit positive.  Pulmonary:     Effort: Pulmonary effort is normal. No respiratory distress.     Breath sounds: Normal breath sounds. No wheezing, rhonchi or rales.  Chest:  Chest wall: No tenderness.  Abdominal:     General: Bowel sounds are normal. There is no distension.     Palpations: Abdomen is soft. There is no mass.     Tenderness: There is no abdominal tenderness. There is no right CVA tenderness, left CVA tenderness, guarding or rebound.  Musculoskeletal:        General: No swelling or tenderness. Normal range of motion.     Cervical back: Normal range of motion. No rigidity or  tenderness.     Right lower leg: No edema.     Left lower leg: No edema.  Lymphadenopathy:     Cervical: No cervical adenopathy.  Skin:    General: Skin is warm.     Coloration: Skin is not pale.     Findings: No bruising, erythema or rash.     Comments: Right plantar ulcer/callus and left Great toe metatarsal callus resolve non-tender to palpation.   Neurological:     Mental Status: He is alert and oriented to person, place, and time.     Cranial Nerves: No cranial nerve deficit.     Motor: No weakness.     Coordination: Coordination normal.     Gait: Gait normal.  Psychiatric:        Mood and Affect: Mood normal.        Speech: Speech normal.        Behavior: Behavior normal.        Thought Content: Thought content normal.        Judgment: Judgment normal.    Labs reviewed: Recent Labs    07/21/19 0928 07/21/19 0943 07/31/19 0318 07/31/19 0318 08/01/19 0325 10/03/19 0926 12/20/19 0311 12/21/19 0352 12/23/19 0832  NA 140   < > 131*   < > 132*   < > 139 138 136  K 4.5   < > 4.1   < > 3.8   < > 3.6 3.8 3.8  CL 108   < > 96*   < > 95*   < > 99 99 101  CO2 23   < > 18*   < > 20*   < > 28 26 22   GLUCOSE 110*   < > 202*   < > 205*   < > 133* 83 199*  BUN 72*   < > 117*   < > 65*   < > 76* 37* 80*  CREATININE 5.55*   < > 7.77*   < > 5.73*   < > 5.94* 4.47* 7.28*  CALCIUM 9.0   < > 8.4*   < > 8.5*   < > 8.4* 8.9 8.6*  MG 1.8  --   --   --   --   --   --   --   --   PHOS 5.1*   < > 8.2*  --  6.5*  --   --   --  5.1*   < > = values in this interval not displayed.   Recent Labs    07/21/19 0928 07/21/19 1821 07/23/19 0608 07/24/19 0241 10/21/19 1035 11/09/19 0000 12/23/19 0832  AST 95*  --  47*  --  177*  --   --   ALT 51*  --  34  --  214*  --   --   ALKPHOS 42  --  43  --  86 74  --   BILITOT 0.7  --  1.2  --  1.0  --   --  PROT 7.7  --  7.6  --  7.4  --   --   ALBUMIN 3.2*   < > 3.0*   < > 3.3* 5.0 2.5*   < > = values in this interval not displayed.   Recent  Labs    07/21/19 0928 07/21/19 0943 07/29/19 0357 07/30/19 1219 12/17/19 1042 12/19/19 0547 12/20/19 0311 12/20/19 0311 12/20/19 0628 12/20/19 0628 12/21/19 0352 12/21/19 1327 12/22/19 1325 12/22/19 2112 12/23/19 0657  WBC 7.2   < > 6.7   < > 4.5   < > 4.7  --  4.8  --  5.6  --   --   --   --   NEUTROABS 5.6  --  4.4  --  2.8  --   --   --   --   --   --   --   --   --   --   HGB 9.7*   < > 8.2*   < > 8.4*   < > 7.0*   < > 7.3*   < > 9.0*   < > 8.3* 7.5* 8.3*  HCT 28.9*   < > 24.4*   < > 26.0*   < > 22.5*   < > 23.1*   < > 28.3*   < > 26.5* 23.8* 26.9*  MCV 100.3*   < > 96.1   < > 81   < > 87.9  --  86.5  --  87.9  --   --   --   --   PLT 200   < > 205   < > 145*   < > 142*  --  147*  --  158  --   --   --   --    < > = values in this interval not displayed.   Lab Results  Component Value Date   TSH 0.81 07/18/2017   Lab Results  Component Value Date   HGBA1C 5.0 11/09/2019   Lab Results  Component Value Date   CHOL 65 12/21/2019   HDL 17 (L) 12/21/2019   LDLCALC 29 12/21/2019   TRIG 95 12/21/2019   CHOLHDL 3.8 12/21/2019    Significant Diagnostic Results in last 30 days:  DG Foot Complete Right  Result Date: 12/31/2019 Please see detailed radiograph report in office note.   Assessment/Plan 1. Type 2 diabetes mellitus with diabetic neuropathy, with long-term current use of insulin (HCC) Lab Results  Component Value Date   HGBA1C 5.0 11/09/2019   CBG stable.continue onHumalog 20 units three times as needed if CBG > 150.continue on Gabapentin 100 mg tablet twice daily for neuropathy.On ASA and Statin.  - Other/Misc lab test: Hgb A1C and TSH level to be drawn during HD orders faxed to [name] at [facility] # [phone]  2. Essential hypertension B/p at goal.continue on Imdur 30 mg 24 Hr tablet daily. - Other/Misc lab test: CBC/diff,TSH level to be drawn during HD orders faxed to [facility] at [facility] # [phone] On  ASA,Brilinta,Statin and Nitro.   3. Hyperlipidemia associated with type 2 diabetes mellitus ([place]) LDL at goal. - Other/Misc lab test: Lipid panel to be drawn during Hemodialysis orders faxed to [facility] at [facility] # [phone]  4. ESRD (end stage renal disease) on dialysis [facility]) On Hemodialysis M,W and F continue to follow up with Nephrologist.continue on Renvela 800 mg tablet three times daily with meals.  - Other/Misc  lab test CBC/diff   5. Slow transit constipation Metamucil effective. - Other/Misc lab test CBC/diff   6. Gout, unspecified cause, unspecified chronicity, unspecified site Reports no flare up.continue on Allopurinol 100 mg tablet daily.Also on Colchicine 0.6 mg tablet daily as needed.  - Other/Misc lab test: CBC/diff   7. Chronic obstructive pulmonary disease, unspecified COPD type ([place]) Breath stable.continue on Anoro Ellipta   8. Chronic combined systolic and diastolic congestive heart failure (HCC) No signs of fluid overload.   9. Anemia of chronic disease Hgb at baseline s/p one PRBC transfusion due to GI bleed.  - Other/Misc lab test: CBC/diff   Family/ staff Communication: Reviewed plan of care with patient verbalized understanding.   Labs/tests ordered: Hgb A1C,Lipid panel and TSH level to be drawn during HD orders faxed to [facility] at [facility] # [phone]   Next Appointment : 6 months for medical management of chronic issues.Labs to be drawn during Hemodialysis.   [name], NP

## 2020-01-28 NOTE — Patient Instructions (Signed)
-   Continue current medication  - Labs ordered to be drawn during dialysis

## 2020-01-29 DIAGNOSIS — N186 End stage renal disease: Secondary | ICD-10-CM | POA: Diagnosis not present

## 2020-01-29 DIAGNOSIS — N2581 Secondary hyperparathyroidism of renal origin: Secondary | ICD-10-CM | POA: Diagnosis not present

## 2020-01-29 DIAGNOSIS — D509 Iron deficiency anemia, unspecified: Secondary | ICD-10-CM | POA: Diagnosis not present

## 2020-01-29 DIAGNOSIS — Z992 Dependence on renal dialysis: Secondary | ICD-10-CM | POA: Diagnosis not present

## 2020-01-29 DIAGNOSIS — D631 Anemia in chronic kidney disease: Secondary | ICD-10-CM | POA: Diagnosis not present

## 2020-01-29 DIAGNOSIS — E1122 Type 2 diabetes mellitus with diabetic chronic kidney disease: Secondary | ICD-10-CM | POA: Diagnosis not present

## 2020-01-30 ENCOUNTER — Ambulatory Visit (INDEPENDENT_AMBULATORY_CARE_PROVIDER_SITE_OTHER): Payer: Medicare Other | Admitting: Podiatry

## 2020-01-30 ENCOUNTER — Encounter: Payer: Self-pay | Admitting: Podiatry

## 2020-01-30 ENCOUNTER — Other Ambulatory Visit: Payer: Self-pay

## 2020-01-30 VITALS — Temp 98.0°F

## 2020-01-30 DIAGNOSIS — L97512 Non-pressure chronic ulcer of other part of right foot with fat layer exposed: Secondary | ICD-10-CM | POA: Diagnosis not present

## 2020-01-30 DIAGNOSIS — E1142 Type 2 diabetes mellitus with diabetic polyneuropathy: Secondary | ICD-10-CM | POA: Diagnosis not present

## 2020-01-30 DIAGNOSIS — L84 Corns and callosities: Secondary | ICD-10-CM

## 2020-01-31 DIAGNOSIS — E1122 Type 2 diabetes mellitus with diabetic chronic kidney disease: Secondary | ICD-10-CM | POA: Diagnosis not present

## 2020-01-31 DIAGNOSIS — N2581 Secondary hyperparathyroidism of renal origin: Secondary | ICD-10-CM | POA: Diagnosis not present

## 2020-01-31 DIAGNOSIS — D631 Anemia in chronic kidney disease: Secondary | ICD-10-CM | POA: Diagnosis not present

## 2020-01-31 DIAGNOSIS — N186 End stage renal disease: Secondary | ICD-10-CM | POA: Diagnosis not present

## 2020-01-31 DIAGNOSIS — Z992 Dependence on renal dialysis: Secondary | ICD-10-CM | POA: Diagnosis not present

## 2020-01-31 DIAGNOSIS — D509 Iron deficiency anemia, unspecified: Secondary | ICD-10-CM | POA: Diagnosis not present

## 2020-02-02 ENCOUNTER — Other Ambulatory Visit: Payer: Self-pay | Admitting: Nurse Practitioner

## 2020-02-03 ENCOUNTER — Other Ambulatory Visit: Payer: Self-pay | Admitting: *Deleted

## 2020-02-03 DIAGNOSIS — I5042 Chronic combined systolic (congestive) and diastolic (congestive) heart failure: Secondary | ICD-10-CM

## 2020-02-03 DIAGNOSIS — N2581 Secondary hyperparathyroidism of renal origin: Secondary | ICD-10-CM | POA: Diagnosis not present

## 2020-02-03 DIAGNOSIS — D631 Anemia in chronic kidney disease: Secondary | ICD-10-CM | POA: Diagnosis not present

## 2020-02-03 DIAGNOSIS — Z992 Dependence on renal dialysis: Secondary | ICD-10-CM | POA: Diagnosis not present

## 2020-02-03 DIAGNOSIS — E1122 Type 2 diabetes mellitus with diabetic chronic kidney disease: Secondary | ICD-10-CM | POA: Diagnosis not present

## 2020-02-03 DIAGNOSIS — D509 Iron deficiency anemia, unspecified: Secondary | ICD-10-CM | POA: Diagnosis not present

## 2020-02-03 DIAGNOSIS — N186 End stage renal disease: Secondary | ICD-10-CM | POA: Diagnosis not present

## 2020-02-03 MED ORDER — ISOSORBIDE MONONITRATE ER 30 MG PO TB24: ORAL_TABLET | ORAL | 1 refills | Status: DC

## 2020-02-03 NOTE — Progress Notes (Signed)
Subjective: 69 year old male presents the office today for evaluation of a wound on his left big toe as well as the right submetatarsal as well as along the left medial hallux. He states that he is doing much better.  He is back to regular shoes.  Denies any drainage or pus or any swelling or redness.  He feels the wounds of healed.  He states his nails do not need to be trimmed today Denies any systemic complaints such as fevers, chills, nausea, vomiting. No acute changes since last appointment, and no other complaints at this time.   Objective: AAO x3, NAD DP/PT pulses palpable bilaterally, CRT less than 3 seconds Preulcerative area on the left hallux but no definitive skin breakdown identified.  On the right foot submetatarsal is a hyperkeratotic lesion as well as the medial hallux and upon debridement with the wounds of healed.  There is no other open lesions identified at this time.   No open lesions or pre-ulcerative lesions.  No pain with calf compression, swelling, warmth, erythema  Assessment: Healed ulceration  Plan: -All treatment options discussed with the patient including all alternatives, risks, complications.  -Debrided hyperkeratotic tissue without any complications or bleeding.  The wounds remain healed.  Continue with supportive shoes and offloading.  He does not need the nails trimmed today.  He will follow up with Dr. Elisha Ponder for this.   Trula Slade DPM

## 2020-02-03 NOTE — Telephone Encounter (Signed)
[facility] 

## 2020-02-05 DIAGNOSIS — Z992 Dependence on renal dialysis: Secondary | ICD-10-CM | POA: Diagnosis not present

## 2020-02-05 DIAGNOSIS — N186 End stage renal disease: Secondary | ICD-10-CM | POA: Diagnosis not present

## 2020-02-05 DIAGNOSIS — D631 Anemia in chronic kidney disease: Secondary | ICD-10-CM | POA: Diagnosis not present

## 2020-02-05 DIAGNOSIS — N2581 Secondary hyperparathyroidism of renal origin: Secondary | ICD-10-CM | POA: Diagnosis not present

## 2020-02-05 DIAGNOSIS — D509 Iron deficiency anemia, unspecified: Secondary | ICD-10-CM | POA: Diagnosis not present

## 2020-02-05 DIAGNOSIS — E1122 Type 2 diabetes mellitus with diabetic chronic kidney disease: Secondary | ICD-10-CM | POA: Diagnosis not present

## 2020-02-05 LAB — BASIC METABOLIC PANEL
BUN: 65 — AB (ref 4–21)
Chloride: 103 (ref 99–108)
Creatinine: 5.6 — AB (ref 0.6–1.3)
Glucose: 132
Potassium: 4.1 (ref 3.4–5.3)
Sodium: 141 (ref 137–147)

## 2020-02-05 LAB — HEMOGLOBIN A1C: Hemoglobin A1C: 4.4

## 2020-02-05 LAB — HEPATIC FUNCTION PANEL: Alkaline Phosphatase: 90 (ref 25–125)

## 2020-02-05 LAB — COMPREHENSIVE METABOLIC PANEL
Albumin: 3.6 (ref 3.5–5.0)
Calcium: 9.1 (ref 8.7–10.7)
Globulin: 3.3

## 2020-02-05 LAB — IRON,TIBC AND FERRITIN PANEL
Ferritin: 65
Iron: 47
TIBC: 431
UIBC: 384

## 2020-02-05 LAB — CBC AND DIFFERENTIAL
HCT: 27 — AB (ref 41–53)
Hemoglobin: 7.9 — AB (ref 13.5–17.5)
WBC: 7.7

## 2020-02-05 LAB — CBC: RBC: 7.9 — AB (ref 3.87–5.11)

## 2020-02-05 LAB — TSH: TSH: 0.18 — AB (ref 0.41–5.90)

## 2020-02-06 ENCOUNTER — Telehealth: Payer: Self-pay | Admitting: *Deleted

## 2020-02-06 NOTE — Telephone Encounter (Signed)
Patient called and stated that the Gabapentin is not helping with his gout pain in his feet. Stated that he was taking 3 pills a day but the Dialysis Dr. Ricard Dillon it back to 2 a day because it is damaging the kidneys. Patient stated that he is also using the Voltaren gel and rubbing on them with minimum relief. Patient is wanting to know if you can prescribe some kind of pain medication to help. Please Advise.

## 2020-02-06 NOTE — Telephone Encounter (Signed)
He will need an OV to discuss any kind of pain medication

## 2020-02-06 NOTE — Telephone Encounter (Signed)
Patient will call back to schedule appointment.

## 2020-02-06 NOTE — Telephone Encounter (Signed)
LMOM to return call.

## 2020-02-06 NOTE — Progress Notes (Signed)
Date:  02/13/2020   ID:  Jacob [name], DOB 09-Sep-1951, MRN [id number]  PCP:  [name], NP  Cardiologist:  Dr. [name]    History of Present Illness:  69 y.o. with distant CABG in 2004 with EF improving from 24% to 45-50% after revascularization. Last myovue 2016 with scar no ischemia. Has done well with medical Rx and cath avoided due to CRF Now on dialysis  Followed by Dr [name] Had revision of left arm fistula January 2019  Comorbid conditions include HIV previous drug abuse HTN and HLD  Echo 02/07/19 EF 40-45% Severe LAE no significant valve disease  Myovue 12/13/18 abnormal EF 26% large inferior wall infarct   Cr 5.2 03/08/19  Hct 25.8 transfused 02/11/19 Had fistula revised with 2nd stage basilic vein transposition left arm Dr Donnetta Hutching 10/03/19   EGD AVM;s Rx did not have colonoscopy Iron Rx given   Still anemia with dyspnea Getting iron infusions.  BS poorly controlled eating too much bread and popsicles Also drinking a lot of wine A1c 9.2 Triglycerides 574  Called office with more edema and dyspnea 11/26/19  LE venous duplex 12/03/19 no DVT LE arterial duplex 12/05/19 no PVD  Been on dialysis since October 2020  M/WF still making urine weight is done a lot  Edema is worse on standing He indicates that Dr Dederding following his kidneys Now and did not want to add oral lasix   He has had negative COVID test December and has had vaccine   Right and left cath done by Dr Irish Lack 12/19/19  Had patent SVG to RCA distal LAD stenosis too small to intervene SVG RAMUS 95% with stenting  Circumflex occluded with left to left collaterals  RA 16 mmHg PA 70/28 mmHg PCWP 28 mmHg  TTE 02/06/19 EF 40-45%   Feels better post cath Not clear why Dr Dederding stopped his coreg needs to be back on beta blocker   Past Medical History:  Diagnosis Date  . Acute respiratory failure ([place]) 03/01/2018  . Arthritis    "all over; mostly knees and back" (02/28/2018)  . CHF (congestive heart  failure) ([facility])    not on any meds  . Chronic lower back pain    stenosis  . Community acquired pneumonia 09/06/2013  . COPD (chronic obstructive pulmonary disease) ([place])   . Coronary atherosclerosis of native coronary artery 2005   s/p surgery  . Drug abuse ([place])    hx; tested for cocaine as recently as 2/08. says he is not using drugs now - avoided defib. for this reason   . ESRD (end stage renal disease) ([place])    Hemo M-W-F- Richarda Blade  . GERD (gastroesophageal reflux disease)    takes OTC meds as needed  . GI bleeding 02/06/2019  . Glaucoma    uses eye drops daily  . Hepatitis B 1968   "tx'd w/isolation; caught it from toilet stools in gym"  . History of blood transfusion 03/01/2019  . History of colon polyps    benign  . History of gout    takes Allopurinol daily as well as Colchicine-if needed (02/28/2018)  . History of kidney stones   . HTN (hypertension)    takes Coreg,Imdur.and Apresoline daily  . Human immunodeficiency virus (HIV) disease ([place]) dx'd 1995   takes Genvoya daily  . Hyperlipidemia    takes Atorvastatin daily  . Ischemic cardiomyopathy   . Muscle spasm    takes Zanaflex as needed  .  Myocardial infarction ([place]) ~ 2004/2005  . Nocturia   . Peripheral neuropathy    takes gabapentin daily  . Pneumonia    "at least twice" (02/28/2018)  . Shortness of breath dyspnea    rarely but if notices it then with exertion  . Syphilis, unspecified   . Type II diabetes mellitus ([place]) 2004   Lantus daily.Average fasting blood sugar 125-199  . Wears glasses   . Wears partial dentures     Past Surgical History:  Procedure Laterality Date  . AV FISTULA PLACEMENT Left 08/02/2018   Procedure: ARTERIOVENOUS (AV) FISTULA CREATION  left arm radiocephlic;  Surgeon: Marty Heck, MD;  Location: [place];  Service: Vascular;  Laterality: Left;  . AV FISTULA PLACEMENT Left 08/01/2019   Procedure: LEFT BRACHIOCEPHALIC ARTERIOVENOUS (AV) FISTULA CREATION;  Surgeon: [name], MD;  Location: [place];  Service: Vascular;  Laterality: Left;  . BASCILIC VEIN TRANSPOSITION Left 10/03/2019   Procedure: BASILIC VEIN TRANSPOSITION LEFT SECOND STAGE;  Surgeon: [name], MD;  Location: [place];  Service: Vascular;  Laterality: Left;  . CARDIAC CATHETERIZATION  10/2002; 12/19/2004   [name] 03/08/2011  . COLONOSCOPY  2013   Dr.John Henrene Pastor   . CORONARY ARTERY BYPASS GRAFT  02/24/2003   CABG X2/notes 03/08/2011  . CORONARY STENT INTERVENTION N/A 12/19/2019   Procedure: CORONARY STENT INTERVENTION;  Surgeon: Jettie Booze, MD;  Location: [place] [facility];  Service: Cardiovascular;  Laterality: N/A;  . ESOPHAGOGASTRODUODENOSCOPY (EGD) WITH PROPOFOL N/A 02/08/2019   Procedure: ESOPHAGOGASTRODUODENOSCOPY (EGD) WITH PROPOFOL;  Surgeon: Milus Banister, MD;  Location: [place];  Service: Gastroenterology;  Laterality: N/A;  . ESOPHAGOGASTRODUODENOSCOPY (EGD) WITH PROPOFOL N/A 12/22/2019   Procedure: ESOPHAGOGASTRODUODENOSCOPY (EGD) WITH PROPOFOL;  Surgeon: Lavena Bullion, DO;  Location: [place];  Service: Gastroenterology;  Laterality: N/A;  . HEMOSTASIS CLIP PLACEMENT  12/22/2019   Procedure: HEMOSTASIS CLIP PLACEMENT;  Surgeon: Lavena Bullion, DO;  Location: [place];  Service: Gastroenterology;;  . HOT HEMOSTASIS N/A 02/08/2019   Procedure: HOT HEMOSTASIS (ARGON PLASMA COAGULATION/BICAP);  Surgeon: Milus Banister, MD;  Location[facility] END[facility];  Service: Gastroenterology;  Laterality: N/A;  . HOT HEMOSTASIS N/A 12/22/2019   Procedure: HOT HEMOSTASIS (ARGON PLASMA COAGULATION/BICAP);  Surgeon: Lavena Bullion, DO;  Location: [facility] ENDOSCOPY;  Service: Gastroenterology;  Laterality: N/A;  . INTERTROCHANTERIC HIP FRACTURE SURGERY Left 11/2006   [name] 03/08/2011  . INTRAVASCULAR ULTRASOUND/IVUS N/A 12/19/2019   Procedure: Intravascular Ultrasound/IVUS;  Surgeon: Jettie Booze, MD;  Location: [place] [facility];  Service: Cardiovascular;  Laterality: N/A;   . IR FLUORO GUIDE CV LINE RIGHT  07/24/2019  . IR FLUORO GUIDE CV LINE RIGHT  07/30/2019  . IR US GUIDE VASC ACCESS RIGHT  07/24/2019  . IR US GUIDE VASC ACCESS RIGHT  07/30/2019  . LAPAROSCOPIC CHOLECYSTECTOMY  05/2006  . LIGATION OF COMPETING BRANCHES OF ARTERIOVENOUS FISTULA Left 11/05/2018   Procedure: LIGATION OF COMPETING BRANCHES OF ARTERIOVENOUS FISTULA  LEFT  ARM;  Surgeon: Marty Heck, MD;  Location: [place];  Service: Vascular;  Laterality: Left;  . LUMBAR LAMINECTOMY/DECOMPRESSION MICRODISCECTOMY N/A 02/29/2016   Procedure: Left L4-5 Lateral Recess Decompression, Removal Extradural Intraspinal Facet Cyst;  Surgeon: Marybelle Killings, MD;  Location: [place];  Service: Orthopedics;  Laterality: N/A;  . MULTIPLE TOOTH EXTRACTIONS    . ORIF MANDIBULAR FRACTURE Left 08/13/2004   ORIF of left body fracture mandible with KLS Martin 2.3-mm six hole/notes 03/08/2011  . RIGHT/LEFT HEART CATH AND  CORONARY/GRAFT ANGIOGRAPHY N/A 12/19/2019   Procedure: RIGHT/LEFT HEART CATH AND CORONARY/GRAFT ANGIOGRAPHY;  Surgeon: Jettie Booze, MD;  Location: [place] [facility];  Service: Cardiovascular;  Laterality: N/A;  . SCLEROTHERAPY  12/22/2019   Procedure: SCLEROTHERAPY;  Surgeon: Lavena Bullion, DO;  Location: [facility] ENDOSCOPY;  Service: Gastroenterology;;     Current Outpatient Medications  Medication Sig Dispense Refill  . acetaminophen (TYLENOL) 500 MG tablet Take 1,000 mg by mouth every 6 (six) hours as needed for moderate pain.     [name] allopurinol (ZYLOPRIM) 100 MG tablet TAKE 1 TABLET(100 MG) BY MOUTH DAILY 90 tablet 1  . ANORO ELLIPTA 62.5-25 MCG/INH AEPB INHALE 1 PUFF BY MOUTH EVERY DAY 60 each 5  . atorvastatin (LIPITOR) 80 MG tablet Take 1 tablet (80 mg total) by mouth daily. 90 tablet 3  . B-D UF III MINI PEN NEEDLES 31G X 5 MM MISC USE FOUR TIMES DAILY 100 each 11  . BIKTARVY 50-200-25 MG TABS tablet TAKE 1 TABLET BY MOUTH DAILY 30 tablet 5  . Choline Fenofibrate (FENOFIBRIC ACID) 135 MG  CPDR TAKE 1 CAPSULE BY MOUTH DAILY 90 capsule 1  . colchicine 0.6 MG tablet Take 0.6 mg by mouth daily as needed (pain).    [name] diclofenac Sodium (VOLTAREN) 1 % GEL Apply 2 g topically 3 (three) times daily as needed (pain). 100 g 1  . gabapentin (NEURONTIN) 300 MG capsule Take 1 capsule (300 mg total) by mouth daily. (Patient taking differently: Take 300 mg by mouth 2 (two) times daily. )    . insulin lispro (HUMALOG) 100 UNIT/ML injection Inject 20 Units into the skin 3 (three) times daily as needed for high blood sugar (above 150).     . isosorbide mononitrate (IMDUR) 30 MG 24 hr tablet Take one tablet by mouth once daily 90 tablet 1  . Lancets (ONETOUCH DELICA PLUS [id number]) MISC Inject 1 Device as directed 3 (three) times daily. Dx: E11.40 300 each 3  . latanoprost (XALATAN) 0.005 % ophthalmic solution Place 1 drop into both eyes at bedtime.    . multivitamin (RENA-VIT) TABS tablet Take 1 tablet by mouth daily.    . nitroGLYCERIN (NITROSTAT) 0.3 MG SL tablet ONE TABLET UNDER TONGUE AS NEEDED FOR CHEST PAIN 25 tablet 4  . ONETOUCH ULTRA test strip USE TO TEST BLOOD SUGAR 3  TIMES DAILY 300 strip 1  . pantoprazole (PROTONIX) 40 MG tablet Take 1 tablet (40 mg total) by mouth 2 (two) times daily. 60 tablet 1  . sevelamer carbonate (RENVELA) 800 MG tablet Take 800 mg by mouth 3 (three) times daily with meals.    . simethicone (MYLICON) 80 MG chewable tablet Chew 1 tablet (80 mg total) by mouth 4 (four) times daily as needed for flatulence. 90 tablet 3  . ticagrelor (BRILINTA) 90 MG TABS tablet Take 1 tablet (90 mg total) by mouth 2 (two) times daily. 180 tablet 3  . timolol (BETIMOL) 0.5 % ophthalmic solution Apply to eye.    . timolol (TIMOPTIC) 0.5 % ophthalmic solution Place 1 drop into both eyes every morning.    . carvedilol (COREG) 3.125 MG tablet Take 1 tablet (3.125 mg total) by mouth 2 (two) times daily. 180 tablet 3   No current facility-administered medications for this visit.     Allergies:   Augmentin [amoxicillin-pot clavulanate] and Amphetamines    Social History:  The patient  reports that he has been smoking cigarettes. He has a 21.50 pack-year smoking history. He has  never used smokeless tobacco. He reports previous alcohol use of about 12.0 standard drinks of alcohol per week. He reports that he does not use drugs.   Family History:  The patient's family history includes Alzheimer's disease in his mother and sister; Diabetes in his brother, brother, and sister; Drug abuse in his brother; Heart attack in his brother; Heart failure in his father; Hypertension in his brother and father; Stroke in his sister.    ROS:  General:no colds or fevers,  weight down 3 lbs by our scales but at home back to baseline Skin:no rashes or ulcers HEENT:no blurred vision, no congestion CV:see HPI PUL:see HPI GI:no diarrhea constipation or melena, no indigestion GU:no hematuria, no dysuria MS:no joint pain, no claudication Neuro:no syncope, no lightheadedness Endo:+ diabetes glucose stable, no thyroid disease  Wt Readings from Last 3 Encounters:  02/13/20 170 lb (77.1 kg)  01/28/20 174 lb 8 oz (79.2 kg)  01/07/20 175 lb (79.4 kg)     PHYSICAL EXAM: VS:  BP (!) 120/52   Pulse 97   Ht 6\' 2"  (1.88 m)   Wt 170 lb (77.1 kg)   SpO2 99%   BMI 21.83 kg/m  , BMI Body mass index is 21.83 kg/m.   Affect appropriate Healthy:  appears stated age 2: normal Neck supple with no adenopathy JVP normal no bruits no thyromegaly Lungs clear with no wheezing and good diaphragmatic motion Heart:  S1/S2 no murmur, no rub, gallop or click PMI enlarged post sternotimy  Abdomen: benighn, BS positve, no tenderness, no AAA no bruit.  No HSM or HJR Distal pulses intact with no bruits No edema Neuro non-focal Skin warm and dry No muscular weakness    EKG:   12/20/19 SR Rate 110 RBBB old IMI     Recent Labs: 07/21/2019: B Natriuretic Peptide 1,535.2; Magnesium  1.8 10/21/2019: ALT 214 12/21/2019: Platelets 158 02/05/2020: BUN 65; Creatinine 5.6; Hemoglobin 7.9; Potassium 4.1; Sodium 141; TSH 0.18    Lipid Panel    Component Value Date/Time   CHOL 65 12/21/2019 0352   CHOL 121 08/07/2015 1041   TRIG 95 12/21/2019 0352   HDL 17 (L) 12/21/2019 0352   HDL 32 (L) 08/07/2015 1041   CHOLHDL 3.8 12/21/2019 0352   VLDL 19 12/21/2019 0352   LDLCALC 29 12/21/2019 0352   LDLCALC  06/04/2019 1019     Comment:     . LDL cholesterol not calculated. Triglyceride levels greater than 400 mg/dL invalidate calculated LDL results. . Reference range: <100 . Desirable range <100 mg/dL for primary prevention;   <70 mg/dL for patients with CHD or diabetic patients  with > or = 2 CHD risk factors. [name] LDL-C is now calculated using the Martin-Hopkins  calculation, which is a validated novel method providing  better accuracy than the Friedewald equation in the  estimation of LDL-C.  Cresenciano Genre et al. Annamaria Helling. 6767;209(47): 2061-2068  (http://education.[employer].com/faq/FAQ164)        Other studies Reviewed: Additional studies/ records that were reviewed today include: recent OV note.  ECHO  02/07/19 IMPRESSIONS    1. The left ventricle has mild-moderately reduced systolic function, with  an ejection fraction of 40-45%. The cavity size was normal. Mild basal  septal hypertrophy. Left ventricular diastolic Doppler parameters are  consistent with pseudonormalization.  Elevated left ventricular end-diastolic pressure Left ventricular diffuse  hypokinesis.  2. The right ventricle has normal systolic function. The cavity was  normal. There is no increase in right ventricular wall thickness.  3.  Left atrial size was severely dilated.  4. There was trivial MR, TR and PR.    Cath 12/19/19  Conclusion    Prox RCA lesion is 100% stenosed. SVG to RCA is widely patent.  Dist LAD lesion is 90% stenosed. Too small and distal for PCI.  3rd Diag  lesion is 50% stenosed.  Prox Cx lesion is 100% stenosed. Left to left collaterals.  Ramus lesion is 100% stenosed.  SVG to ramus with Origin to Prox Graft lesion is 95% stenosed.  A drug-eluting stent was successfully placed using a SYNERGY XD [id number], postdilated to >4 mm. Optimized with IVUS.  Post intervention, there is a 0% residual stenosis.  There is moderate to severe left ventricular systolic dysfunction.  There is no aortic valve stenosis.  Hemodynamic findings consistent with moderate pulmonary hypertension.   Continue dual antiplatelet therapy for 6 months at least.  COnsider clopidogrel longer term if there is no interaction with his HIV meds, given the diffuse nature of CAD.      ASSESSMENT AND PLAN:  1.  Acute on chronic systolic and diastolic HF this is related to CRF  EF 40-45% echo April 2020  Will update TTE post intervention to RAMUS graft Filling pressures high volume controlled with dialysis Add nitrates   2. CAD: Cath 12/15/19 stent SVG Ramus patent SVG to RCA. Left to left collaterals to circumflex and diffuse distal LAD disease Imdur 30 mg daily added to medical Rx  Not on ACE/ARB due to CRF Start coreg 3.125 bid initially on non dialysis days then daily   3. DM:  Discussed low carb diet.  Target hemoglobin A1c is 6.5 or less.  Continue current medications.  4. CRF:  On dialysis since October he indicates weight is down despite worse dyspnea and edema F/U renal ? Add oral lasix since not anuric   5. Cholesterol:  On statin labs with primary Triglycerides  up due to poor BS control   6. HTN:  Well controlled.  Continue current medications and low sodium Dash type diet.    7. Anemia per renal/GI  transfused 12/24/19 Hct 26.9 EGD 12/22/19 with angiodyplastic lesions in gastric fundus Rx with Argon laser   Change Brilinta to Plavix in August  TTE for EF refer to EP if EF <35% post intervention  Coreg 3.125 bid   F/U in 6 months   Jenkins Rouge, MD

## 2020-02-07 DIAGNOSIS — D509 Iron deficiency anemia, unspecified: Secondary | ICD-10-CM | POA: Diagnosis not present

## 2020-02-07 DIAGNOSIS — D631 Anemia in chronic kidney disease: Secondary | ICD-10-CM | POA: Diagnosis not present

## 2020-02-07 DIAGNOSIS — N186 End stage renal disease: Secondary | ICD-10-CM | POA: Diagnosis not present

## 2020-02-07 DIAGNOSIS — E1122 Type 2 diabetes mellitus with diabetic chronic kidney disease: Secondary | ICD-10-CM | POA: Diagnosis not present

## 2020-02-07 DIAGNOSIS — Z992 Dependence on renal dialysis: Secondary | ICD-10-CM | POA: Diagnosis not present

## 2020-02-07 DIAGNOSIS — N2581 Secondary hyperparathyroidism of renal origin: Secondary | ICD-10-CM | POA: Diagnosis not present

## 2020-02-10 DIAGNOSIS — N2581 Secondary hyperparathyroidism of renal origin: Secondary | ICD-10-CM | POA: Diagnosis not present

## 2020-02-10 DIAGNOSIS — D631 Anemia in chronic kidney disease: Secondary | ICD-10-CM | POA: Diagnosis not present

## 2020-02-10 DIAGNOSIS — E1122 Type 2 diabetes mellitus with diabetic chronic kidney disease: Secondary | ICD-10-CM | POA: Diagnosis not present

## 2020-02-10 DIAGNOSIS — D509 Iron deficiency anemia, unspecified: Secondary | ICD-10-CM | POA: Diagnosis not present

## 2020-02-10 DIAGNOSIS — Z992 Dependence on renal dialysis: Secondary | ICD-10-CM | POA: Diagnosis not present

## 2020-02-10 DIAGNOSIS — N186 End stage renal disease: Secondary | ICD-10-CM | POA: Diagnosis not present

## 2020-02-11 ENCOUNTER — Telehealth: Payer: Self-pay | Admitting: Family

## 2020-02-11 ENCOUNTER — Other Ambulatory Visit: Payer: Self-pay | Admitting: Internal Medicine

## 2020-02-11 DIAGNOSIS — D638 Anemia in other chronic diseases classified elsewhere: Secondary | ICD-10-CM

## 2020-02-11 DIAGNOSIS — D62 Acute posthemorrhagic anemia: Secondary | ICD-10-CM

## 2020-02-11 DIAGNOSIS — R946 Abnormal results of thyroid function studies: Secondary | ICD-10-CM

## 2020-02-11 NOTE — Telephone Encounter (Signed)
Spoke with patient and advised results, pt is agreeable with checking labs, order faxed to [facility] [phone] Labs abstracted

## 2020-02-11 NOTE — Telephone Encounter (Signed)
-   Lab work results received drawn during dialysis.Hemogloin is low indicates anemia due to End stage renal disease.indicies are high.will check for vitamin B12 deficiency.  - renal function high continue on dialysis.electrolytes are normal. - Thyroid level are low recommend checking total T4 and T3 level to rule out hypothyroidism or hyperthyroidism.  - Hgb A1c within normal range.continue Humalog injection as needed.

## 2020-02-11 NOTE — Telephone Encounter (Signed)
So should we sent another order to dialysis for the Vitamin B12, Total T4 and T3?

## 2020-02-11 NOTE — Telephone Encounter (Signed)
Yes. if patient is okay with labs.

## 2020-02-12 DIAGNOSIS — N186 End stage renal disease: Secondary | ICD-10-CM | POA: Diagnosis not present

## 2020-02-12 DIAGNOSIS — N2581 Secondary hyperparathyroidism of renal origin: Secondary | ICD-10-CM | POA: Diagnosis not present

## 2020-02-12 DIAGNOSIS — D631 Anemia in chronic kidney disease: Secondary | ICD-10-CM | POA: Diagnosis not present

## 2020-02-12 DIAGNOSIS — Z992 Dependence on renal dialysis: Secondary | ICD-10-CM | POA: Diagnosis not present

## 2020-02-12 DIAGNOSIS — D509 Iron deficiency anemia, unspecified: Secondary | ICD-10-CM | POA: Diagnosis not present

## 2020-02-12 DIAGNOSIS — E1122 Type 2 diabetes mellitus with diabetic chronic kidney disease: Secondary | ICD-10-CM | POA: Diagnosis not present

## 2020-02-13 ENCOUNTER — Encounter: Payer: Self-pay | Admitting: Cardiovascular Disease

## 2020-02-13 ENCOUNTER — Ambulatory Visit (INDEPENDENT_AMBULATORY_CARE_PROVIDER_SITE_OTHER): Payer: Medicare Other | Admitting: Cardiovascular Disease

## 2020-02-13 ENCOUNTER — Telehealth: Payer: Self-pay | Admitting: Cardiovascular Disease

## 2020-02-13 ENCOUNTER — Other Ambulatory Visit: Payer: Self-pay

## 2020-02-13 VITALS — BP 120/52 | HR 97 | Ht 74.0 in | Wt 170.0 lb

## 2020-02-13 DIAGNOSIS — I2510 Atherosclerotic heart disease of native coronary artery without angina pectoris: Secondary | ICD-10-CM

## 2020-02-13 MED ORDER — CARVEDILOL 3.125 MG PO TABS: 3.1250 mg | ORAL_TABLET | Freq: Two times a day (BID) | ORAL | 3 refills | Status: DC

## 2020-02-13 NOTE — Telephone Encounter (Signed)
Patient wanted to advise Dr. [name] and Jeannene Patella that his A1C level on 02/05/20 was 4.4

## 2020-02-13 NOTE — Telephone Encounter (Signed)
Tell him I said that was spectacular

## 2020-02-13 NOTE — Patient Instructions (Addendum)
Medication Instructions:  Your physician has recommended you make the following change in your medication:  1-START Carvedilol 3.125 mg by mouth twice daily.  *If you need a refill on your cardiac medications before your next appointment, please call your pharmacy*  Lab Work: If you have labs (blood work) drawn today and your tests are completely normal, you will receive your results only by: [name] MyChart Message (if you have MyChart) OR . A paper copy in the mail If you have any lab test that is abnormal or we need to change your treatment, we will call you to review the results.  Testing/Procedures: None ordered today.  Follow-Up: At [facility], you and your health needs are our priority.  As part of our continuing mission to provide you with exceptional heart care, we have created designated Provider Care Teams.  These Care Teams include your primary Cardiologist (physician) and Advanced Practice Providers (APPs -  Physician Assistants and Nurse Practitioners) who all work together to provide you with the care you need, when you need it.  We recommend signing up for the patient portal called "MyChart".  Sign up information is provided on this After Visit Summary.  MyChart is used to connect with patients for Virtual Visits (Telemedicine).  Patients are able to view lab/test results, encounter notes, upcoming appointments, etc.  Non-urgent messages can be sent to your provider as well.   To learn more about what you can do with MyChart, go to [email or web address].    Your next appointment:   6 month(s)  The format for your next appointment:   In Person  Provider:   You may see Jenkins Rouge, MD or one of the following Advanced Practice Providers on your designated Care Team:    Truitt Merle, NP  Cecilie Kicks, NP  Kathyrn Drown, NP

## 2020-02-13 NOTE — Telephone Encounter (Signed)
Called patient with Dr. Kyla Balzarine message.

## 2020-02-13 NOTE — Telephone Encounter (Signed)
Will forward to Dr. [name] so he is aware.

## 2020-02-14 DIAGNOSIS — Z992 Dependence on renal dialysis: Secondary | ICD-10-CM | POA: Diagnosis not present

## 2020-02-14 DIAGNOSIS — D509 Iron deficiency anemia, unspecified: Secondary | ICD-10-CM | POA: Diagnosis not present

## 2020-02-14 DIAGNOSIS — N186 End stage renal disease: Secondary | ICD-10-CM | POA: Diagnosis not present

## 2020-02-14 DIAGNOSIS — N2581 Secondary hyperparathyroidism of renal origin: Secondary | ICD-10-CM | POA: Diagnosis not present

## 2020-02-14 DIAGNOSIS — D631 Anemia in chronic kidney disease: Secondary | ICD-10-CM | POA: Diagnosis not present

## 2020-02-14 DIAGNOSIS — E1122 Type 2 diabetes mellitus with diabetic chronic kidney disease: Secondary | ICD-10-CM | POA: Diagnosis not present

## 2020-02-17 DIAGNOSIS — D631 Anemia in chronic kidney disease: Secondary | ICD-10-CM | POA: Diagnosis not present

## 2020-02-17 DIAGNOSIS — N2581 Secondary hyperparathyroidism of renal origin: Secondary | ICD-10-CM | POA: Diagnosis not present

## 2020-02-17 DIAGNOSIS — N186 End stage renal disease: Secondary | ICD-10-CM | POA: Diagnosis not present

## 2020-02-17 DIAGNOSIS — D509 Iron deficiency anemia, unspecified: Secondary | ICD-10-CM | POA: Diagnosis not present

## 2020-02-17 DIAGNOSIS — E1122 Type 2 diabetes mellitus with diabetic chronic kidney disease: Secondary | ICD-10-CM | POA: Diagnosis not present

## 2020-02-17 DIAGNOSIS — Z992 Dependence on renal dialysis: Secondary | ICD-10-CM | POA: Diagnosis not present

## 2020-02-19 DIAGNOSIS — N186 End stage renal disease: Secondary | ICD-10-CM | POA: Diagnosis not present

## 2020-02-19 DIAGNOSIS — D509 Iron deficiency anemia, unspecified: Secondary | ICD-10-CM | POA: Diagnosis not present

## 2020-02-19 DIAGNOSIS — Z992 Dependence on renal dialysis: Secondary | ICD-10-CM | POA: Diagnosis not present

## 2020-02-19 DIAGNOSIS — E1122 Type 2 diabetes mellitus with diabetic chronic kidney disease: Secondary | ICD-10-CM | POA: Diagnosis not present

## 2020-02-19 DIAGNOSIS — D631 Anemia in chronic kidney disease: Secondary | ICD-10-CM | POA: Diagnosis not present

## 2020-02-19 DIAGNOSIS — N2581 Secondary hyperparathyroidism of renal origin: Secondary | ICD-10-CM | POA: Diagnosis not present

## 2020-02-21 DIAGNOSIS — N186 End stage renal disease: Secondary | ICD-10-CM | POA: Diagnosis not present

## 2020-02-21 DIAGNOSIS — I129 Hypertensive chronic kidney disease with stage 1 through stage 4 chronic kidney disease, or unspecified chronic kidney disease: Secondary | ICD-10-CM | POA: Diagnosis not present

## 2020-02-21 DIAGNOSIS — D631 Anemia in chronic kidney disease: Secondary | ICD-10-CM | POA: Diagnosis not present

## 2020-02-21 DIAGNOSIS — E1122 Type 2 diabetes mellitus with diabetic chronic kidney disease: Secondary | ICD-10-CM | POA: Diagnosis not present

## 2020-02-21 DIAGNOSIS — D509 Iron deficiency anemia, unspecified: Secondary | ICD-10-CM | POA: Diagnosis not present

## 2020-02-21 DIAGNOSIS — N2581 Secondary hyperparathyroidism of renal origin: Secondary | ICD-10-CM | POA: Diagnosis not present

## 2020-02-21 DIAGNOSIS — Z992 Dependence on renal dialysis: Secondary | ICD-10-CM | POA: Diagnosis not present

## 2020-02-22 ENCOUNTER — Other Ambulatory Visit: Payer: Self-pay | Admitting: Nurse Practitioner

## 2020-02-29 ENCOUNTER — Other Ambulatory Visit: Payer: Self-pay | Admitting: Nurse Practitioner

## 2020-02-29 DIAGNOSIS — E119 Type 2 diabetes mellitus without complications: Secondary | ICD-10-CM

## 2020-03-02 DIAGNOSIS — Z992 Dependence on renal dialysis: Secondary | ICD-10-CM | POA: Diagnosis not present

## 2020-03-02 DIAGNOSIS — E1122 Type 2 diabetes mellitus with diabetic chronic kidney disease: Secondary | ICD-10-CM | POA: Diagnosis not present

## 2020-03-02 DIAGNOSIS — D631 Anemia in chronic kidney disease: Secondary | ICD-10-CM | POA: Diagnosis not present

## 2020-03-02 DIAGNOSIS — D509 Iron deficiency anemia, unspecified: Secondary | ICD-10-CM | POA: Diagnosis not present

## 2020-03-02 DIAGNOSIS — N186 End stage renal disease: Secondary | ICD-10-CM | POA: Diagnosis not present

## 2020-03-02 DIAGNOSIS — N2581 Secondary hyperparathyroidism of renal origin: Secondary | ICD-10-CM | POA: Diagnosis not present

## 2020-03-04 DIAGNOSIS — D631 Anemia in chronic kidney disease: Secondary | ICD-10-CM | POA: Diagnosis not present

## 2020-03-04 DIAGNOSIS — Z992 Dependence on renal dialysis: Secondary | ICD-10-CM | POA: Diagnosis not present

## 2020-03-04 DIAGNOSIS — E1122 Type 2 diabetes mellitus with diabetic chronic kidney disease: Secondary | ICD-10-CM | POA: Diagnosis not present

## 2020-03-04 DIAGNOSIS — N186 End stage renal disease: Secondary | ICD-10-CM | POA: Diagnosis not present

## 2020-03-04 DIAGNOSIS — D509 Iron deficiency anemia, unspecified: Secondary | ICD-10-CM | POA: Diagnosis not present

## 2020-03-04 DIAGNOSIS — N2581 Secondary hyperparathyroidism of renal origin: Secondary | ICD-10-CM | POA: Diagnosis not present

## 2020-03-05 DIAGNOSIS — Z992 Dependence on renal dialysis: Secondary | ICD-10-CM | POA: Diagnosis not present

## 2020-03-05 DIAGNOSIS — T82858A Stenosis of vascular prosthetic devices, implants and grafts, initial encounter: Secondary | ICD-10-CM | POA: Diagnosis not present

## 2020-03-05 DIAGNOSIS — Z4802 Encounter for removal of sutures: Secondary | ICD-10-CM | POA: Insufficient documentation

## 2020-03-05 DIAGNOSIS — I871 Compression of vein: Secondary | ICD-10-CM | POA: Diagnosis not present

## 2020-03-05 DIAGNOSIS — N186 End stage renal disease: Secondary | ICD-10-CM | POA: Diagnosis not present

## 2020-03-06 DIAGNOSIS — N2581 Secondary hyperparathyroidism of renal origin: Secondary | ICD-10-CM | POA: Diagnosis not present

## 2020-03-06 DIAGNOSIS — E1122 Type 2 diabetes mellitus with diabetic chronic kidney disease: Secondary | ICD-10-CM | POA: Diagnosis not present

## 2020-03-06 DIAGNOSIS — Z992 Dependence on renal dialysis: Secondary | ICD-10-CM | POA: Diagnosis not present

## 2020-03-06 DIAGNOSIS — D509 Iron deficiency anemia, unspecified: Secondary | ICD-10-CM | POA: Diagnosis not present

## 2020-03-06 DIAGNOSIS — D631 Anemia in chronic kidney disease: Secondary | ICD-10-CM | POA: Diagnosis not present

## 2020-03-06 DIAGNOSIS — N186 End stage renal disease: Secondary | ICD-10-CM | POA: Diagnosis not present

## 2020-03-09 DIAGNOSIS — D631 Anemia in chronic kidney disease: Secondary | ICD-10-CM | POA: Diagnosis not present

## 2020-03-09 DIAGNOSIS — E1122 Type 2 diabetes mellitus with diabetic chronic kidney disease: Secondary | ICD-10-CM | POA: Diagnosis not present

## 2020-03-09 DIAGNOSIS — N2581 Secondary hyperparathyroidism of renal origin: Secondary | ICD-10-CM | POA: Diagnosis not present

## 2020-03-09 DIAGNOSIS — Z992 Dependence on renal dialysis: Secondary | ICD-10-CM | POA: Diagnosis not present

## 2020-03-09 DIAGNOSIS — D509 Iron deficiency anemia, unspecified: Secondary | ICD-10-CM | POA: Diagnosis not present

## 2020-03-09 DIAGNOSIS — N186 End stage renal disease: Secondary | ICD-10-CM | POA: Diagnosis not present

## 2020-03-10 ENCOUNTER — Other Ambulatory Visit: Payer: Self-pay | Admitting: *Deleted

## 2020-03-10 MED ORDER — DICLOFENAC SODIUM 1 % EX GEL: 2.0000 g | Freq: Three times a day (TID) | CUTANEOUS | 1 refills | Status: DC | PRN

## 2020-03-10 MED ORDER — INSULIN LISPRO 100 UNIT/ML ~~LOC~~ SOLN: 20.0000 [IU] | Freq: Three times a day (TID) | SUBCUTANEOUS | 3 refills | Status: DC | PRN

## 2020-03-10 NOTE — Addendum Note (Signed)
Addended by: Rafael Bihari A on: 03/10/2020 01:06 PM   Modules accepted: Orders

## 2020-03-10 NOTE — Telephone Encounter (Signed)
Patient requested Rx for a 90 day supply.

## 2020-03-10 NOTE — Telephone Encounter (Signed)
Patient called requesting refill on his Voltaren gel. Faxed to pharmacy.

## 2020-03-10 NOTE — Telephone Encounter (Signed)
Received refill Request [facility] and sent to [facility] for approval due to [place].

## 2020-03-11 ENCOUNTER — Other Ambulatory Visit: Payer: Self-pay

## 2020-03-11 ENCOUNTER — Encounter (HOSPITAL_COMMUNITY): Payer: Self-pay | Admitting: Emergency Medicine

## 2020-03-11 ENCOUNTER — Emergency Department (HOSPITAL_COMMUNITY): Payer: Medicare Other

## 2020-03-11 ENCOUNTER — Emergency Department (HOSPITAL_COMMUNITY)
Admission: EM | Admit: 2020-03-11 | Discharge: 2020-03-11 | Disposition: A | Discharge status: Home / Self Care | Payer: Medicare Other | Attending: Emergency Medicine | Admitting: Emergency Medicine

## 2020-03-11 DIAGNOSIS — N186 End stage renal disease: Secondary | ICD-10-CM | POA: Diagnosis not present

## 2020-03-11 DIAGNOSIS — I2510 Atherosclerotic heart disease of native coronary artery without angina pectoris: Secondary | ICD-10-CM | POA: Insufficient documentation

## 2020-03-11 DIAGNOSIS — I120 Hypertensive chronic kidney disease with stage 5 chronic kidney disease or end stage renal disease: Secondary | ICD-10-CM | POA: Diagnosis not present

## 2020-03-11 DIAGNOSIS — I132 Hypertensive heart and chronic kidney disease with heart failure and with stage 5 chronic kidney disease, or end stage renal disease: Secondary | ICD-10-CM | POA: Insufficient documentation

## 2020-03-11 DIAGNOSIS — E876 Hypokalemia: Secondary | ICD-10-CM | POA: Diagnosis not present

## 2020-03-11 DIAGNOSIS — D509 Iron deficiency anemia, unspecified: Secondary | ICD-10-CM | POA: Diagnosis not present

## 2020-03-11 DIAGNOSIS — B182 Chronic viral hepatitis C: Secondary | ICD-10-CM | POA: Diagnosis not present

## 2020-03-11 DIAGNOSIS — E1122 Type 2 diabetes mellitus with diabetic chronic kidney disease: Secondary | ICD-10-CM | POA: Diagnosis not present

## 2020-03-11 DIAGNOSIS — Z794 Long term (current) use of insulin: Secondary | ICD-10-CM | POA: Diagnosis not present

## 2020-03-11 DIAGNOSIS — B20 Human immunodeficiency virus [HIV] disease: Secondary | ICD-10-CM | POA: Insufficient documentation

## 2020-03-11 DIAGNOSIS — N2581 Secondary hyperparathyroidism of renal origin: Secondary | ICD-10-CM | POA: Diagnosis not present

## 2020-03-11 DIAGNOSIS — R0602 Shortness of breath: Secondary | ICD-10-CM | POA: Diagnosis not present

## 2020-03-11 DIAGNOSIS — D649 Anemia, unspecified: Secondary | ICD-10-CM

## 2020-03-11 DIAGNOSIS — Z992 Dependence on renal dialysis: Secondary | ICD-10-CM | POA: Insufficient documentation

## 2020-03-11 DIAGNOSIS — D631 Anemia in chronic kidney disease: Secondary | ICD-10-CM | POA: Diagnosis not present

## 2020-03-11 DIAGNOSIS — I509 Heart failure, unspecified: Secondary | ICD-10-CM | POA: Diagnosis not present

## 2020-03-11 LAB — CBC WITH DIFFERENTIAL/PLATELET
Abs Immature Granulocytes: 0.01 10*3/uL (ref 0.00–0.07)
Basophils Absolute: 0 10*3/uL (ref 0.0–0.1)
Basophils Relative: 1 %
Eosinophils Absolute: 0.1 10*3/uL (ref 0.0–0.5)
Eosinophils Relative: 2 %
HCT: 23 % — ABNORMAL LOW (ref 39.0–52.0)
Hemoglobin: 7 g/dL — ABNORMAL LOW (ref 13.0–17.0)
Immature Granulocytes: 0 %
Lymphocytes Relative: 28 %
Lymphs Abs: 1.1 10*3/uL (ref 0.7–4.0)
MCH: 30.2 pg (ref 26.0–34.0)
MCHC: 30.4 g/dL (ref 30.0–36.0)
MCV: 99.1 fL (ref 80.0–100.0)
Monocytes Absolute: 0.4 10*3/uL (ref 0.1–1.0)
Monocytes Relative: 10 %
Neutro Abs: 2.4 10*3/uL (ref 1.7–7.7)
Neutrophils Relative %: 59 %
Platelets: 200 10*3/uL (ref 150–400)
RBC: 2.32 MIL/uL — ABNORMAL LOW (ref 4.22–5.81)
RDW: 18.1 % — ABNORMAL HIGH (ref 11.5–15.5)
WBC: 4 10*3/uL (ref 4.0–10.5)
nRBC: 0 % (ref 0.0–0.2)

## 2020-03-11 LAB — BASIC METABOLIC PANEL
Anion gap: 14 (ref 5–15)
BUN: 32 mg/dL — ABNORMAL HIGH (ref 8–23)
CO2: 27 mmol/L (ref 22–32)
Calcium: 8 mg/dL — ABNORMAL LOW (ref 8.9–10.3)
Chloride: 93 mmol/L — ABNORMAL LOW (ref 98–111)
Creatinine, Ser: 3.15 mg/dL — ABNORMAL HIGH (ref 0.61–1.24)
GFR calc Af Amer: 22 mL/min — ABNORMAL LOW (ref >60)
GFR calc non Af Amer: 19 mL/min — ABNORMAL LOW (ref >60)
Glucose, Bld: 168 mg/dL — ABNORMAL HIGH (ref 70–99)
Potassium: 3.1 mmol/L — ABNORMAL LOW (ref 3.5–5.1)
Sodium: 134 mmol/L — ABNORMAL LOW (ref 135–145)

## 2020-03-11 LAB — PREPARE RBC (CROSSMATCH)

## 2020-03-11 MED ORDER — SODIUM CHLORIDE 0.9 % IV SOLN
10.0000 mL/h | Freq: Once | INTRAVENOUS | Status: AC
  Administered 2020-03-11: 10 mL/h via INTRAVENOUS

## 2020-03-11 MED ORDER — FUROSEMIDE 10 MG/ML IJ SOLN
160.0000 mg | Freq: Once | INTRAVENOUS | Status: AC
  Administered 2020-03-11: 160 mg via INTRAVENOUS
  Filled 2020-03-11: qty 16

## 2020-03-11 NOTE — ED Provider Notes (Signed)
[facility] EMERGENCY DEPARTMENT Provider Note   CSN: [id number] Arrival date & time: 03/11/20  1545    History Chief Complaint  Patient presents with  . Abnormal Lab  . Shortness of Breath    Jacob [name] is a 69 y.o. male with past medical history significant for CHF, ESRD MWF, HIV, hep B, MI who presents for evaluation of abnormal lab.  Was seen at dialysis today.  States was noted to have hemoglobin which was low and they told to come to emergency department for transfusion.  History of transfusions.  He denies any melanotic or bright red blood per rectum with his stools.  States he has intermittent SOB prior to dialysis which resolves once he is dialyzed. No current SOB, CP.  Still makes urine.  Denies fever, chills, nausea, vomiting, chest pain, shortness of breath abdominal pain, diarrhea, dysuria, lower extremity edema, PND, orthopnea, rashes or lesions.  Denies additional aggravating or relieving factors.  Patient is adamant that he does not want admission at this time.  States he just here for transfusion. Patient does admit to cramping in his BLE when he goes to dialysis. Was told this was due to fluid being pulled off.  Nephrologist- [place], [facility] MWF  History obtained from patient andpast medical records. No interpretor was used.  HPI     Past Medical History:  Diagnosis Date  . Acute respiratory failure ([place]) 03/01/2018  . Arthritis    "all over; mostly knees and back" (02/28/2018)  . CHF (congestive heart failure) (HCC)    not on any meds  . Chronic lower back pain    stenosis  . Community acquired pneumonia 09/06/2013  . COPD (chronic obstructive pulmonary disease) ([place])   . Coronary atherosclerosis of native coronary artery 2005   s/p surgery  . Drug abuse ([place])    hx; tested for cocaine as recently as 2/08. says he is not using drugs now - avoided defib. for this reason   . ESRD (end stage renal disease) ([place])    Hemo M-W-F- Richarda Blade   . GERD (gastroesophageal reflux disease)    takes OTC meds as needed  . GI bleeding 02/06/2019  . Glaucoma    uses eye drops daily  . Hepatitis B 1968   "tx'd w/isolation; caught it from toilet stools in gym"  . History of blood transfusion 03/01/2019  . History of colon polyps    benign  . History of gout    takes Allopurinol daily as well as Colchicine-if needed (02/28/2018)  . History of kidney stones   . HTN (hypertension)    takes Coreg,Imdur.and Apresoline daily  . Human immunodeficiency virus (HIV) disease ([place]) dx'd 1995   takes Genvoya daily  . Hyperlipidemia    takes Atorvastatin daily  . Ischemic cardiomyopathy   . Muscle spasm    takes Zanaflex as needed  . Myocardial infarction ([place]) ~ 2004/2005  . Nocturia   . Peripheral neuropathy    takes gabapentin daily  . Pneumonia    "at least twice" (02/28/2018)  . Shortness of breath dyspnea    rarely but if notices it then with exertion  . Syphilis, unspecified   . Type II diabetes mellitus ([place]) 2004   Lantus daily.Average fasting blood sugar 125-199  . Wears glasses   . Wears partial dentures     Patient Active Problem List   Diagnosis Date Noted  . Cardiomyopathy ([place]) 12/21/2019  . Iron deficiency anemia   . Acute blood  loss anemia   . Acute on chronic anemia   . AVM (arteriovenous malformation) of duodenum, acquired   . Abnormal nuclear stress test 12/19/2019  . ESRD (end stage renal disease) on dialysis ([place])   . Hand pain, left   . Cocaine abuse ([place])   . Hypertensive emergency   . CHF exacerbation ([place]) 07/21/2019  . Diabetic foot ulcer ([place]) 02/11/2019  . Gastric AVM   . Melena 02/07/2019  . CKD stage 4 due to type 2 diabetes mellitus ([place]) 05/30/2018  . Hyperlipidemia associated with type 2 diabetes mellitus ([place]) 05/30/2018  . Right foot ulcer ([place]) 02/28/2018  . Chronic obstructive pulmonary disease ([place]) 02/28/2018  . Anemia of chronic disease 11/20/2016  . CKD (chronic kidney disease) stage  3, GFR 30-59 ml/min 11/20/2016  . CHF (congestive heart failure) ([place]) 11/10/2016  . Pneumonia 08/02/2016  . History of lumbar laminectomy for spinal cord decompression 02/29/2016  . Type 2 diabetes mellitus with vascular disease ([place]) 06/17/2015  . HTN (hypertension) 04/26/2015  . DM (diabetes mellitus) ([place]) 04/26/2015  . Ischemic cardiomyopathy 05/12/2014  . Ulcer of lower extremity ([place]) 09/06/2013  . Chest pain 09/06/2013  . Type II or unspecified type diabetes mellitus with unspecified complication, not stated as uncontrolled 10/22/2012  . Bunion of left foot 11/25/2011  . Bunion, right foot 11/25/2011  . Polysubstance abuse ([place]) 07/28/2011  . Hip fracture, left ([place]) 04/04/2011  . Closed fracture of neck of femur ([place]) 04/04/2011  . Insomnia 02/18/2011  . CAD (coronary artery disease) 04/20/2009  . Acute on chronic combined systolic (congestive) and diastolic (congestive) heart failure ([place]) 04/20/2009  . HLD (hyperlipidemia) 11/20/2006  . Gout 11/20/2006  . TOBACCO ABUSE 11/20/2006  . Essential hypertension 11/20/2006  . Human immunodeficiency virus (HIV) disease ([place]) 09/02/2006    Past Surgical History:  Procedure Laterality Date  . AV FISTULA PLACEMENT Left 08/02/2018   Procedure: ARTERIOVENOUS (AV) FISTULA CREATION  left arm radiocephlic;  Surgeon: Marty Heck, MD;  Location: [facility];  Service: Vascular;  Laterality: Left;  . AV FISTULA PLACEMENT Left 08/01/2019   Procedure: LEFT BRACHIOCEPHALIC ARTERIOVENOUS (AV) FISTULA CREATION;  Surgeon: [name], MD;  Location: [facility];  Service: Vascular;  Laterality: Left;  . BASCILIC VEIN TRANSPOSITION Left 10/03/2019   Procedure: BASILIC VEIN TRANSPOSITION LEFT SECOND STAGE;  Surgeon: [name], MD;  Location: [facility];  Service: Vascular;  Laterality: Left;  . CARDIAC CATHETERIZATION  10/2002; 12/19/2004   [name] 03/08/2011  . COLONOSCOPY  2013   Dr.John Henrene Pastor   . CORONARY ARTERY BYPASS GRAFT  02/24/2003   CABG  X2/notes 03/08/2011  . CORONARY STENT INTERVENTION N/A 12/19/2019   Procedure: CORONARY STENT INTERVENTION;  Surgeon: Jettie Booze, MD;  Location: [facility] CV LAB;  Service: Cardiovascular;  Laterality: N/A;  . ESOPHAGOGASTRODUODENOSCOPY (EGD) WITH PROPOFOL N/A 02/08/2019   Procedure: ESOPHAGOGASTRODUODENOSCOPY (EGD) WITH PROPOFOL;  Surgeon: Milus Banister, MD;  Location: [facility];  Service: Gastroenterology;  Laterality: N/A;  . ESOPHAGOGASTRODUODENOSCOPY (EGD) WITH PROPOFOL N/A 12/22/2019   Procedure: ESOPHAGOGASTRODUODENOSCOPY (EGD) WITH PROPOFOL;  Surgeon: Lavena Bullion, DO;  Location: [facility];  Service: Gastroenterology;  Laterality: N/A;  . HEMOSTASIS CLIP PLACEMENT  12/22/2019   Procedure: HEMOSTASIS CLIP PLACEMENT;  Surgeon: Lavena Bullion, DO;  Location: [facility];  Service: Gastroenterology;;  . HOT HEMOSTASIS N/A 02/08/2019   Procedure: HOT HEMOSTASIS (ARGON PLASMA COAGULATION/BICAP);  Surgeon: Milus Banister, MD;  Location: [facility] ENDOSCOPY;  Service: Gastroenterology;  Laterality: N/A;  .  HOT HEMOSTASIS N/A 12/22/2019   Procedure: HOT HEMOSTASIS (ARGON PLASMA COAGULATION/BICAP);  Surgeon: Lavena Bullion, DO;  Location: [facility] ENDOSCOPY;  Service: Gastroenterology;  Laterality: N/A;  . INTERTROCHANTERIC HIP FRACTURE SURGERY Left 11/2006   [name] 03/08/2011  . INTRAVASCULAR ULTRASOUND/IVUS N/A 12/19/2019   Procedure: Intravascular Ultrasound/IVUS;  Surgeon: Jettie Booze, MD;  Location: [facility];  Service: Cardiovascular;  Laterality: N/A;  . IR FLUORO GUIDE CV LINE RIGHT  07/24/2019  . IR FLUORO GUIDE CV LINE RIGHT  07/30/2019  . IR US GUIDE VASC ACCESS RIGHT  07/24/2019  . IR US GUIDE VASC ACCESS RIGHT  07/30/2019  . LAPAROSCOPIC CHOLECYSTECTOMY  05/2006  . LIGATION OF COMPETING BRANCHES OF ARTERIOVENOUS FISTULA Left 11/05/2018   Procedure: LIGATION OF COMPETING BRANCHES OF ARTERIOVENOUS FISTULA  LEFT  ARM;  Surgeon: Marty Heck, MD;   Location: [facility];  Service: Vascular;  Laterality: Left;  . LUMBAR LAMINECTOMY/DECOMPRESSION MICRODISCECTOMY N/A 02/29/2016   Procedure: Left L4-5 Lateral Recess Decompression, Removal Extradural Intraspinal Facet Cyst;  Surgeon: Marybelle Killings, MD;  Location: [facility];  Service: Orthopedics;  Laterality: N/A;  . MULTIPLE TOOTH EXTRACTIONS    . ORIF MANDIBULAR FRACTURE Left 08/13/2004   ORIF of left body fracture mandible with KLS Martin 2.3-mm six hole/notes 03/08/2011  . RIGHT/LEFT HEART CATH AND CORONARY/GRAFT ANGIOGRAPHY N/A 12/19/2019   Procedure: RIGHT/LEFT HEART CATH AND CORONARY/GRAFT ANGIOGRAPHY;  Surgeon: Jettie Booze, MD;  Location: [facility] CV LAB;  Service: Cardiovascular;  Laterality: N/A;  . SCLEROTHERAPY  12/22/2019   Procedure: SCLEROTHERAPY;  Surgeon: Lavena Bullion, DO;  Location: [facility] ENDOSCOPY;  Service: Gastroenterology;;       Family History  Problem Relation Age of Onset  . Heart failure Father   . Hypertension Father   . Diabetes Brother   . Heart attack Brother   . Alzheimer's disease Mother   . Stroke Sister   . Diabetes Sister   . Alzheimer's disease Sister   . Hypertension Brother   . Diabetes Brother   . Drug abuse Brother   . Colon cancer Neg Hx     Social History   Tobacco Use  . Smoking status: Current Every Day Smoker    Packs/day: 0.50    Years: 43.00    Pack years: 21.50    Types: Cigarettes  . Smokeless tobacco: Never Used  Substance Use Topics  . Alcohol use: Not Currently    Alcohol/week: 12.0 standard drinks    Types: 12 Standard drinks or equivalent per week  . Drug use: No    Types: Cocaine    Comment: hx of crack/cocaine 46yrs ago 10/01/2019- none    Home Medications Prior to Admission medications   Medication Sig Start Date End Date Taking? Authorizing Provider  acetaminophen (TYLENOL) 500 MG tablet Take 1,000 mg by mouth every 6 (six) hours as needed for moderate pain.     [provider]  allopurinol (ZYLOPRIM)  100 MG tablet TAKE 1 TABLET(100 MG) BY MOUTH DAILY 01/20/20   [name], NP  ANORO ELLIPTA 62.5-25 MCG/INH AEPB INHALE 1 PUFF BY MOUTH EVERY DAY 12/11/19   [name], Dinah C, NP  atorvastatin (LIPITOR) 80 MG tablet Take 1 tablet (80 mg total) by mouth daily. 12/05/18   [name], NP  B-D UF III MINI PEN NEEDLES 31G X 5 MM MISC USE FOUR TIMES DAILY 03/02/20   [name], NP  BIKTARVY 50-200-25 MG TABS tablet TAKE 1 TABLET BY MOUTH DAILY  02/11/20   [name], MD  carvedilol (COREG) 3.125 MG tablet Take 1 tablet (3.125 mg total) by mouth 2 (two) times daily. 02/13/20   [name], MD  Choline Fenofibrate (FENOFIBRIC ACID) 135 MG CPDR TAKE 1 CAPSULE BY MOUTH DAILY 09/23/19   [name], NP  colchicine 0.6 MG tablet Take 0.6 mg by mouth daily as needed (pain).    [provider]  diclofenac Sodium (VOLTAREN) 1 % GEL Apply 2 g topically 3 (three) times daily as needed (pain). 03/10/20   [name], NP  gabapentin (NEURONTIN) 300 MG capsule TAKE 1 CAPSULE BY MOUTH THREE TIMES DAILY 02/24/20   [name], NP  insulin lispro (HUMALOG) 100 UNIT/ML injection Inject 0.2 mLs (20 Units total) into the skin 3 (three) times daily as needed for high blood sugar (above 150). 03/10/20   [name], NP  isosorbide mononitrate (IMDUR) 30 MG 24 hr tablet Take one tablet by mouth once daily 02/03/20   [name], NP  Lancets [facility] DELICA PLUS [id number]) MISC Inject 1 Device as directed 3 (three) times daily. Dx: E11.40 09/18/19   [name], NP  latanoprost (XALATAN) 0.005 % ophthalmic solution Place 1 drop into both eyes at bedtime.    [provider]  multivitamin (RENA-VIT) TABS tablet Take 1 tablet by mouth daily.    [provider]  nitroGLYCERIN (NITROSTAT) 0.3 MG SL tablet ONE TABLET UNDER TONGUE AS NEEDED FOR CHEST PAIN 05/01/19   [name], MD  [facility] ULTRA test strip USE TO TEST BLOOD SUGAR 3  TIMES DAILY  02/03/20   [name], NP  pantoprazole (PROTONIX) 40 MG tablet Take 1 tablet (40 mg total) by mouth 2 (two) times daily. 12/23/19   Cheryln Manly, NP  sevelamer carbonate (RENVELA) 800 MG tablet Take 800 mg by mouth 3 (three) times daily with meals.    [provider]  simethicone (MYLICON) 80 MG chewable tablet Chew 1 tablet (80 mg total) by mouth 4 (four) times daily as needed for flatulence. 08/16/19   [name], NP  ticagrelor (BRILINTA) 90 MG TABS tablet Take 1 tablet (90 mg total) by mouth 2 (two) times daily. 01/17/20   [name], MD  timolol (BETIMOL) 0.5 % ophthalmic solution Apply to eye. 12/23/19   [provider]  timolol (TIMOPTIC) 0.5 % ophthalmic solution Place 1 drop into both eyes every morning. 04/04/19   [provider]    Allergies    Augmentin [amoxicillin-pot clavulanate] and Amphetamines  Review of Systems   Review of Systems  Constitutional: Negative.   HENT: Negative.   Respiratory: Negative.   Cardiovascular: Negative.   Gastrointestinal: Negative.   Genitourinary: Negative.   Musculoskeletal: Negative.   Skin: Negative.   Neurological: Negative.   All other systems reviewed and are negative.   Physical Exam Updated Vital Signs BP 129/61   Pulse 77   Temp 97.9 F (36.6 C) (Oral)   Resp 14   Ht 6\' 2"  (1.88 m)   SpO2 100%   BMI 21.83 kg/m   Physical Exam Vitals and nursing note reviewed.  Constitutional:      General: He is not in acute distress.    Appearance: He is well-developed. He is not ill-appearing, toxic-appearing or diaphoretic.     Comments: Eating a sandwich on initial evaluation. No acute distress.  HENT:     Head: Normocephalic and atraumatic.     Mouth/Throat:  Mouth: Mucous membranes are moist.  Eyes:     Pupils: Pupils are equal, round, and reactive to light.  Cardiovascular:     Rate and Rhythm: Normal rate and regular rhythm.     Pulses: Normal pulses.     Heart sounds:  Normal heart sounds.  Pulmonary:     Effort: Pulmonary effort is normal. No respiratory distress.     Breath sounds: Normal breath sounds.     Comments: Clear to auscultation bilateral without wheeze, rhonchi or rales.  Speaks in full sentences without difficulty. Abdominal:     General: Bowel sounds are normal. There is no distension.     Palpations: Abdomen is soft. There is no mass.     Tenderness: There is no abdominal tenderness. There is no right CVA tenderness, left CVA tenderness, guarding or rebound.  Musculoskeletal:        General: Normal range of motion.     Cervical back: Normal range of motion and neck supple.     Comments: Dialysis graft to left upper extremity with palpable thrill.  No active bleeding or drainage.  Moves all 4 extremities at difficulty.  Compartments soft.  Skin:    General: Skin is warm and dry.     Capillary Refill: Capillary refill takes less than 2 seconds.     Comments: Brisk cap refill. No edema, erythema, warmth.  Neurological:     General: No focal deficit present.     Mental Status: He is alert and oriented to person, place, and time.     Comments: Ambulatory in room without difficulty. CN 2-12 grossly intact.    ED Results / Procedures / Treatments   Labs (all labs ordered are listed, but only abnormal results are displayed) Labs Reviewed  BASIC METABOLIC PANEL - Abnormal; Notable for the following components:      Result Value   Sodium 134 (*)    Potassium 3.1 (*)    Chloride 93 (*)    Glucose, Bld 168 (*)    BUN 32 (*)    Creatinine, Ser 3.15 (*)    Calcium 8.0 (*)    GFR calc non Af Amer 19 (*)    GFR calc Af Amer 22 (*)    All other components within normal limits  CBC WITH DIFFERENTIAL/PLATELET - Abnormal; Notable for the following components:   RBC 2.32 (*)    Hemoglobin 7.0 (*)    HCT 23.0 (*)    RDW 18.1 (*)    All other components within normal limits  TYPE AND SCREEN  PREPARE RBC (CROSSMATCH)     EKG None  Radiology DG Chest 2 View  Result Date: 03/11/2020 CLINICAL DATA:  Shortness of breath EXAM: CHEST - 2 VIEW COMPARISON:  10/21/2019 FINDINGS: Dialysis catheter has been removed since the prior exam. Cardiac shadow remains enlarged. Aortic calcifications are again noted. Postsurgical changes are again seen. Old rib fractures are noted on the right with healing stable from the prior exam. Lungs are well aerated bilaterally without focal infiltrate. Mild interstitial prominence and vascular congestion is noted likely related to a degree of volume overload. IMPRESSION: Changes consistent with mild fluid overload. No other acute abnormality is noted. Electronically Signed   By: Inez Catalina M.D.   On: 03/11/2020 16:56    Procedures Procedures (including critical care time)  Medications Ordered in ED Medications  0.9 %  sodium chloride infusion (0 mL/hr Intravenous Stopped 03/11/20 2314)  furosemide (LASIX) 160 mg in dextrose 5 % 50  mL IVPB (0 mg Intravenous Stopped 03/11/20 2145)   ED Course  I have reviewed the triage vital signs and the nursing notes.  Pertinent labs & imaging results that were available during my care of the patient were reviewed by me and considered in my medical decision making (see chart for details).  69 year old presents for evaluation of abnormal lab. ESRD patient MWF. Dialyzed today with full session. Still makes urine. Hx of blood transfusion 2/2 ESRD. Hx of SOB prior to dialysis resolved with dialysis. Likely related to fluid overload. No current SOB at this time. Does not appear fluid overloaded at this time. No CP. Eating a subway sandwich on initial evaluation. Patient is adamant about transfusion and dc home.   Labs and imaging personally reviewed and interpreted: CBC without leukocytosis, Hgb 7.0, baseline appears to be 8 from prior labs BMP with hypokalemia at 3.1, will not replace as ESRD patient, Creatinine 3.15 improved from prior at 5 EKG  with right bundle branch and PVC, Hx of similar. No STEMI Dg chest with mild fluid overload  CONSULT with Nephrology Dr. Johnney Ou. Recommends transfuse 1 unit and 160 mg IV lasix given patient still making urine and can dc home with dialysis follow up outpatient.  Discussed plan with patient. Continues to denies CP, SOB, dizziness or lightheadedness. Plan to transfuse PRBC and likely dc home for close outpatient follow up.  Patient reassessed.  Continues to deny any symptoms.  He is ambulatory without difficulty. Will have him follow up with Nephrology.  The patient has been appropriately medically screened and/or stabilized in the ED. I have low suspicion for any other emergent medical condition which would require further screening, evaluation or treatment in the ED or require inpatient management.  Patient is hemodynamically stable and in no acute distress.  Patient able to ambulate in department prior to ED.  Evaluation does not show acute pathology that would require ongoing or additional emergent interventions while in the emergency department or further inpatient treatment.  I have discussed the diagnosis with the patient and answered all questions.  Pain is been managed while in the emergency department and patient has no further complaints prior to discharge.  Patient is comfortable with plan discussed in room and is stable for discharge at this time.  I have discussed strict return precautions for returning to the emergency department.  Patient was encouraged to follow-up with PCP/specialist refer to at discharge.   Patient discussed with attending Dr. Alvino Chapel who is in agreement with above treatment, plan and disposition.    MDM Rules/Calculators/A&P                       Final Clinical Impression(s) / ED Diagnoses Final diagnoses:  Symptomatic anemia  ESRD (end stage renal disease) ([place])  Hypokalemia    Rx / DC Orders ED Discharge Orders    None       Yomira Flitton A,  PA-C 03/11/20 2318    [name], MD 03/13/20 [phone]

## 2020-03-11 NOTE — ED Notes (Signed)
Pt tolerating blood transfusion well, VSS. Blood rate increased to 200/mLhr

## 2020-03-11 NOTE — ED Notes (Signed)
Pt expressing continued tightness/cramping in hands/feet, requested note be placed in chart for dialysis to be able to examine at next visit.

## 2020-03-11 NOTE — ED Triage Notes (Signed)
Patient arrives to ED from his dialysis center with complaints of low hemoglobin reading on his blood draw today. Patient states he is unsure of how low his hemoglobin was but was told to come to the ED for a blood transfusion. Patient states he's been SOB recently. Patient completed his full treatment at dialysis today.

## 2020-03-11 NOTE — ED Notes (Signed)
Blood Consent signed and at bedside

## 2020-03-11 NOTE — ED Notes (Signed)
Pt complaining of significant cramping to L hand/forearm, stating it "hurts so bad it's going to fall off." Pt attributes cramping to pressure & too much fluid being pulled off during dyalisis. Pt provided 2 hot packs & hot packs taped to L wrist/arm. Pt expressed relief from pain. Will continue to monitor

## 2020-03-11 NOTE — Discharge Instructions (Addendum)
Follow up with Dialysis. Return for new or worsening symptoms

## 2020-03-12 ENCOUNTER — Telehealth: Payer: Self-pay | Admitting: *Deleted

## 2020-03-12 ENCOUNTER — Other Ambulatory Visit: Payer: Self-pay | Admitting: *Deleted

## 2020-03-12 LAB — BPAM RBC
Blood Product Expiration Date: 202105252359
ISSUE DATE / TIME: 202105192019
Unit Type and Rh: 600

## 2020-03-12 LAB — TYPE AND SCREEN
ABO/RH(D): A POS
Antibody Screen: NEGATIVE
Unit division: 0

## 2020-03-12 MED ORDER — INSULIN LISPRO (1 UNIT DIAL) 100 UNIT/ML (KWIKPEN): PEN_INJECTOR | SUBCUTANEOUS | 1 refills | Status: DC

## 2020-03-12 NOTE — Telephone Encounter (Signed)
Pt called regarding medication he left in ED room 19 from his visit 03/11/20.  RNCM found medication in biohazard bag at front desk lobby with pt name attached.  RNCM returned call to pt and gave him directions on how to retrieve his medication.  Pt very pleased and stated he will pick-up after lunch today.

## 2020-03-12 NOTE — Telephone Encounter (Signed)
Patient stated that he is doing the KwikPen not the [place] in patient's medication list and faxed to pharmacy.

## 2020-03-13 DIAGNOSIS — E1122 Type 2 diabetes mellitus with diabetic chronic kidney disease: Secondary | ICD-10-CM | POA: Diagnosis not present

## 2020-03-13 DIAGNOSIS — D509 Iron deficiency anemia, unspecified: Secondary | ICD-10-CM | POA: Diagnosis not present

## 2020-03-13 DIAGNOSIS — D631 Anemia in chronic kidney disease: Secondary | ICD-10-CM | POA: Diagnosis not present

## 2020-03-13 DIAGNOSIS — Z992 Dependence on renal dialysis: Secondary | ICD-10-CM | POA: Diagnosis not present

## 2020-03-13 DIAGNOSIS — N2581 Secondary hyperparathyroidism of renal origin: Secondary | ICD-10-CM | POA: Diagnosis not present

## 2020-03-13 DIAGNOSIS — N186 End stage renal disease: Secondary | ICD-10-CM | POA: Diagnosis not present

## 2020-03-16 ENCOUNTER — Other Ambulatory Visit: Payer: Self-pay | Admitting: *Deleted

## 2020-03-16 ENCOUNTER — Telehealth: Payer: Self-pay | Admitting: Nurse Practitioner

## 2020-03-16 DIAGNOSIS — D509 Iron deficiency anemia, unspecified: Secondary | ICD-10-CM | POA: Diagnosis not present

## 2020-03-16 DIAGNOSIS — Z992 Dependence on renal dialysis: Secondary | ICD-10-CM | POA: Diagnosis not present

## 2020-03-16 DIAGNOSIS — N2581 Secondary hyperparathyroidism of renal origin: Secondary | ICD-10-CM | POA: Diagnosis not present

## 2020-03-16 DIAGNOSIS — D631 Anemia in chronic kidney disease: Secondary | ICD-10-CM | POA: Diagnosis not present

## 2020-03-16 DIAGNOSIS — E1122 Type 2 diabetes mellitus with diabetic chronic kidney disease: Secondary | ICD-10-CM | POA: Diagnosis not present

## 2020-03-16 DIAGNOSIS — N186 End stage renal disease: Secondary | ICD-10-CM | POA: Diagnosis not present

## 2020-03-16 MED ORDER — DICLOFENAC SODIUM 1 % EX GEL: 2.0000 g | Freq: Three times a day (TID) | CUTANEOUS | 1 refills | Status: DC | PRN

## 2020-03-16 NOTE — Telephone Encounter (Signed)
Left message of return call.

## 2020-03-16 NOTE — Telephone Encounter (Signed)
Patient returned your call, please call patient one more time.   

## 2020-03-16 NOTE — Telephone Encounter (Signed)
Patient called requesting 90 day supply of the Voltaren Gel.  Pended Rx and sent to [facility] for approval.

## 2020-03-17 NOTE — Telephone Encounter (Signed)
Spoke with the patient. He was sent to the ED by his dialysis team on 03/11/20. He was transfused 1 unti of blood. He states he has not passed any blood. He is not certain who is to be following this. I read the provider note from his visit. It states, "Patient was encouraged to follow-up with PCP/specialist refer to at discharge." He states he did not get any response from his dialysis team yesterday when he asked. I suggested hecall his PCP and ask for an ER follow up appointment. I also stated I would share this information with his primary GI, Dr Bryan Lemma when he returns to the office next week.  Patient thanks me for the help.

## 2020-03-18 DIAGNOSIS — Z992 Dependence on renal dialysis: Secondary | ICD-10-CM | POA: Diagnosis not present

## 2020-03-18 DIAGNOSIS — D509 Iron deficiency anemia, unspecified: Secondary | ICD-10-CM | POA: Diagnosis not present

## 2020-03-18 DIAGNOSIS — N186 End stage renal disease: Secondary | ICD-10-CM | POA: Diagnosis not present

## 2020-03-18 DIAGNOSIS — E1122 Type 2 diabetes mellitus with diabetic chronic kidney disease: Secondary | ICD-10-CM | POA: Diagnosis not present

## 2020-03-18 DIAGNOSIS — N2581 Secondary hyperparathyroidism of renal origin: Secondary | ICD-10-CM | POA: Diagnosis not present

## 2020-03-18 DIAGNOSIS — D631 Anemia in chronic kidney disease: Secondary | ICD-10-CM | POA: Diagnosis not present

## 2020-03-19 ENCOUNTER — Other Ambulatory Visit: Payer: Self-pay

## 2020-03-19 ENCOUNTER — Ambulatory Visit (INDEPENDENT_AMBULATORY_CARE_PROVIDER_SITE_OTHER): Payer: Medicare Other | Admitting: Family

## 2020-03-19 ENCOUNTER — Other Ambulatory Visit: Payer: Self-pay | Admitting: Nurse Practitioner

## 2020-03-19 ENCOUNTER — Encounter: Payer: Self-pay | Admitting: Family

## 2020-03-19 VITALS — BP 100/60 | HR 87 | Temp 98.0°F | Resp 18 | Ht 74.0 in | Wt 175.0 lb

## 2020-03-19 DIAGNOSIS — N186 End stage renal disease: Secondary | ICD-10-CM | POA: Diagnosis not present

## 2020-03-19 DIAGNOSIS — D638 Anemia in other chronic diseases classified elsewhere: Secondary | ICD-10-CM

## 2020-03-19 DIAGNOSIS — D649 Anemia, unspecified: Secondary | ICD-10-CM | POA: Diagnosis not present

## 2020-03-19 LAB — CBC WITH DIFFERENTIAL/PLATELET
Absolute Monocytes: 616 cells/uL (ref 200–950)
Basophils Absolute: 52 cells/uL (ref 0–200)
Basophils Relative: 1.1 %
Eosinophils Absolute: 99 cells/uL (ref 15–500)
Eosinophils Relative: 2.1 %
HCT: 26.6 % — ABNORMAL LOW (ref 38.5–50.0)
Hemoglobin: 8.2 g/dL — ABNORMAL LOW (ref 13.2–17.1)
Lymphs Abs: 1438 cells/uL (ref 850–3900)
MCH: 30.8 pg (ref 27.0–33.0)
MCHC: 30.8 g/dL — ABNORMAL LOW (ref 32.0–36.0)
MCV: 100 fL (ref 80.0–100.0)
MPV: 11.4 fL (ref 7.5–12.5)
Monocytes Relative: 13.1 %
Neutro Abs: 2496 cells/uL (ref 1500–7800)
Neutrophils Relative %: 53.1 %
Platelets: 222 10*3/uL (ref 140–400)
RBC: 2.66 10*6/uL — ABNORMAL LOW (ref 4.20–5.80)
RDW: 15.1 % — ABNORMAL HIGH (ref 11.0–15.0)
Total Lymphocyte: 30.6 %
WBC: 4.7 10*3/uL (ref 3.8–10.8)

## 2020-03-19 LAB — TEST AUTHORIZATION: TEST CODE:: 6399

## 2020-03-19 NOTE — Progress Notes (Signed)
Provider: Emberli Ballester FNP-C  [name], NP  Patient Care Team: [name], NP as PCP - General (Geriatric Medicine) [name], MD as PCP - Infectious Diseases (Infectious Diseases) [name], MD as PCP - Cardiology (Cardiology) [name], MD as Consulting Physician (Ophthalmology) [name], MD as Consulting Physician (Cardiology) [name], MD as Consulting Physician (Infectious Diseases) Gardiner Barefoot, DPM as Consulting Physician (Podiatry) Anabia Weatherwax, Nelda Bucks, NP as Nurse Practitioner (Family Medicine) [place], [name]  Extended Emergency Contact Information Primary Emergency Contact: Bertrum Sol Address: [address]          [place], [place] [address] [place] Phone: [phone] Mobile Phone: [phone] Relation: Friend Secondary Emergency Contact: Alda Berthold [place] Phone: [phone] Relation: Sister  Code Status:  Full Code  Goals of care: Advanced Directive information Advanced Directives 03/11/2020  Does Patient Have a Medical Advance Directive? No  Type of Advance Directive -  Does patient want to make changes to medical advance directive? -  Copy of [place] in Chart? -  Would patient like information on creating a medical advance directive? No - Patient declined     Chief Complaint  Patient presents with  . Acute Visit    ED follow up     HPI:  Pt is a 69 y.o. male seen today for an acute visit for ED follow up for anemia.He was seen during dialysis Hgb was 7.0 Nephrologist was consulted recommended Furosemide 160 mg with transfusion. He was transfused with one PRBC and discharged home.He states has occasional blood or dark stool but follows up with Gastroenterology plan for colonoscopy.Decline rectal exam.states will follow up with GI.   Past Medical History:  Diagnosis Date  . Acute respiratory failure ([place]) 03/01/2018  . Arthritis      "all over; mostly knees and back" (02/28/2018)  . CHF (congestive heart failure) ([facility])    not on any meds  . Chronic lower back pain    stenosis  . Community acquired pneumonia 09/06/2013  . COPD (chronic obstructive pulmonary disease) ([facility])   . Coronary atherosclerosis of native coronary artery 2005   s/p surgery  . Drug abuse ([place])    hx; tested for cocaine as recently as 2/08. says he is not using drugs now - avoided defib. for this reason   . ESRD (end stage renal disease) ([place])    Hemo M-W-F- Richarda Blade  . GERD (gastroesophageal reflux disease)    takes OTC meds as needed  . GI bleeding 02/06/2019  . Glaucoma    uses eye drops daily  . Hepatitis B 1968   "tx'd w/isolation; caught it from toilet stools in gym"  . History of blood transfusion 03/01/2019  . History of colon polyps    benign  . History of gout    takes Allopurinol daily as well as Colchicine-if needed (02/28/2018)  . History of kidney stones   . HTN (hypertension)    takes Coreg,Imdur.and Apresoline daily  . Human immunodeficiency virus (HIV) disease ([name]) dx'd 1995   takes Genvoya daily  . Hyperlipidemia    takes Atorvastatin daily  . Ischemic cardiomyopathy   . Muscle spasm    takes Zanaflex as needed  . Myocardial infarction ([facility]) ~ 2004/2005  . Nocturia   . Peripheral neuropathy    takes gabapentin daily  . Pneumonia    "at least twice" (02/28/2018)  . Shortness of breath dyspnea    rarely but  if notices it then with exertion  . Syphilis, unspecified   . Type II diabetes mellitus ([facility]) 2004   Lantus daily.Average fasting blood sugar 125-199  . Wears glasses   . Wears partial dentures    Past Surgical History:  Procedure Laterality Date  . AV FISTULA PLACEMENT Left 08/02/2018   Procedure: ARTERIOVENOUS (AV) FISTULA CREATION  left arm radiocephlic;  Surgeon: Marty Heck, MD;  Location: [facility];  Service: Vascular;  Laterality: Left;  . AV FISTULA PLACEMENT Left 08/01/2019   Procedure: LEFT  BRACHIOCEPHALIC ARTERIOVENOUS (AV) FISTULA CREATION;  Surgeon: [name], MD;  Location: [facility];  Service: Vascular;  Laterality: Left;  . BASCILIC VEIN TRANSPOSITION Left 10/03/2019   Procedure: BASILIC VEIN TRANSPOSITION LEFT SECOND STAGE;  Surgeon: [name], MD;  Location: [facility];  Service: Vascular;  Laterality: Left;  . CARDIAC CATHETERIZATION  10/2002; 12/19/2004   [name] 03/08/2011  . COLONOSCOPY  2013   Dr.John Henrene Pastor   . CORONARY ARTERY BYPASS GRAFT  02/24/2003   CABG X2/notes 03/08/2011  . CORONARY STENT INTERVENTION N/A 12/19/2019   Procedure: CORONARY STENT INTERVENTION;  Surgeon: Jettie Booze, MD;  Location: [facility];  Service: Cardiovascular;  Laterality: N/A;  . ESOPHAGOGASTRODUODENOSCOPY (EGD) WITH PROPOFOL N/A 02/08/2019   Procedure: ESOPHAGOGASTRODUODENOSCOPY (EGD) WITH PROPOFOL;  Surgeon: Milus Banister, MD;  Location: [facility];  Service: Gastroenterology;  Laterality: N/A;  . ESOPHAGOGASTRODUODENOSCOPY (EGD) WITH PROPOFOL N/A 12/22/2019   Procedure: ESOPHAGOGASTRODUODENOSCOPY (EGD) WITH PROPOFOL;  Surgeon: Lavena Bullion, DO;  Location: [facility];  Service: Gastroenterology;  Laterality: N/A;  . HEMOSTASIS CLIP PLACEMENT  12/22/2019   Procedure: HEMOSTASIS CLIP PLACEMENT;  Surgeon: Lavena Bullion, DO;  Location: [facility];  Service: Gastroenterology;;  . HOT HEMOSTASIS N/A 02/08/2019   Procedure: HOT HEMOSTASIS (ARGON PLASMA COAGULATION/BICAP);  Surgeon: Milus Banister, MD;  Location[name] END[facility];  Service: Gastroenterology;  Laterality: N/A;  . HOT HEMOSTASIS N/A 12/22/2019   Procedure: HOT HEMOSTASIS (ARGON PLASMA COAGULATION/BICAP);  Surgeon: Lavena Bullion, DO;  Location: [facility] ENDOSCOPY;  Service: Gastroenterology;  Laterality: N/A;  . INTERTROCHANTERIC HIP FRACTURE SURGERY Left 11/2006   [name] 03/08/2011  . INTRAVASCULAR ULTRASOUND/IVUS N/A 12/19/2019   Procedure: Intravascular Ultrasound/IVUS;  Surgeon: Jettie Booze, MD;   Location: [facility];  Service: Cardiovascular;  Laterality: N/A;  . IR FLUORO GUIDE CV LINE RIGHT  07/24/2019  . IR FLUORO GUIDE CV LINE RIGHT  07/30/2019  . IR US GUIDE VASC ACCESS RIGHT  07/24/2019  . IR US GUIDE VASC ACCESS RIGHT  07/30/2019  . LAPAROSCOPIC CHOLECYSTECTOMY  05/2006  . LIGATION OF COMPETING BRANCHES OF ARTERIOVENOUS FISTULA Left 11/05/2018   Procedure: LIGATION OF COMPETING BRANCHES OF ARTERIOVENOUS FISTULA  LEFT  ARM;  Surgeon: Marty Heck, MD;  Location: [facility];  Service: Vascular;  Laterality: Left;  . LUMBAR LAMINECTOMY/DECOMPRESSION MICRODISCECTOMY N/A 02/29/2016   Procedure: Left L4-5 Lateral Recess Decompression, Removal Extradural Intraspinal Facet Cyst;  Surgeon: Marybelle Killings, MD;  Location: [facility];  Service: Orthopedics;  Laterality: N/A;  . MULTIPLE TOOTH EXTRACTIONS    . ORIF MANDIBULAR FRACTURE Left 08/13/2004   ORIF of left body fracture mandible with KLS Martin 2.3-mm six hole/notes 03/08/2011  . RIGHT/LEFT HEART CATH AND CORONARY/GRAFT ANGIOGRAPHY N/A 12/19/2019   Procedure: RIGHT/LEFT HEART CATH AND CORONARY/GRAFT ANGIOGRAPHY;  Surgeon: Jettie Booze, MD;  Location: [facility];  Service: Cardiovascular;  Laterality: N/A;  . SCLEROTHERAPY  12/22/2019   Procedure: SCLEROTHERAPY;  Surgeon: Lavena Bullion, DO;  Location: [facility] ENDOSCOPY;  Service: Gastroenterology;;    Allergies  Allergen Reactions  . Augmentin [Amoxicillin-Pot Clavulanate] Diarrhea    Severe  . Amphetamines Other (See Comments)    Unknown reaction type    Outpatient Encounter Medications as of 03/19/2020  Medication Sig  . acetaminophen (TYLENOL) 500 MG tablet Take 1,000 mg by mouth every 6 (six) hours as needed for moderate pain.   [name] allopurinol (ZYLOPRIM) 100 MG tablet TAKE 1 TABLET(100 MG) BY MOUTH DAILY  . ANORO ELLIPTA 62.5-25 MCG/INH AEPB INHALE 1 PUFF BY MOUTH EVERY DAY  . atorvastatin (LIPITOR) 80 MG tablet Take 1 tablet (80 mg total) by mouth daily.  .  B-D UF III MINI PEN NEEDLES 31G X 5 MM MISC USE FOUR TIMES DAILY  . BIKTARVY 50-200-25 MG TABS tablet TAKE 1 TABLET BY MOUTH DAILY  . carvedilol (COREG) 3.125 MG tablet Take 1 tablet (3.125 mg total) by mouth 2 (two) times daily.  . Choline Fenofibrate (FENOFIBRIC ACID) 135 MG CPDR TAKE 1 CAPSULE BY MOUTH DAILY  . colchicine 0.6 MG tablet Take 0.6 mg by mouth daily as needed (pain).  [name] diclofenac Sodium (VOLTAREN) 1 % GEL Apply 2 g topically 3 (three) times daily as needed (pain).  [name] gabapentin (NEURONTIN) 300 MG capsule TAKE 1 CAPSULE BY MOUTH THREE TIMES DAILY  . insulin lispro (HUMALOG KWIKPEN) 100 UNIT/ML KwikPen Inject 0.9ml (20 Units total) into the skin 3 (three) times daily as needed for high blood sugar (above 150)  . isosorbide mononitrate (IMDUR) 30 MG 24 hr tablet Take one tablet by mouth once daily  . Lancets (ONETOUCH DELICA PLUS [id number]) MISC Inject 1 Device as directed 3 (three) times daily. Dx: E11.40  . latanoprost (XALATAN) 0.005 % ophthalmic solution Place 1 drop into both eyes at bedtime.  . multivitamin (RENA-VIT) TABS tablet Take 1 tablet by mouth daily.  . nitroGLYCERIN (NITROSTAT) 0.3 MG SL tablet ONE TABLET UNDER TONGUE AS NEEDED FOR CHEST PAIN  . ONETOUCH ULTRA test strip USE TO TEST BLOOD SUGAR 3  TIMES DAILY  . pantoprazole (PROTONIX) 40 MG tablet Take 1 tablet (40 mg total) by mouth 2 (two) times daily.  . sevelamer carbonate (RENVELA) 800 MG tablet Take 800 mg by mouth 3 (three) times daily with meals.  . simethicone (MYLICON) 80 MG chewable tablet Chew 1 tablet (80 mg total) by mouth 4 (four) times daily as needed for flatulence.  . ticagrelor (BRILINTA) 90 MG TABS tablet Take 1 tablet (90 mg total) by mouth 2 (two) times daily.  . timolol (BETIMOL) 0.5 % ophthalmic solution Apply to eye.  . timolol (TIMOPTIC) 0.5 % ophthalmic solution Place 1 drop into both eyes every morning.   No facility-administered encounter medications on file as of 03/19/2020.     Review of Systems  Constitutional: Negative for appetite change, chills, fatigue and fever.  Eyes: Negative for pain, discharge, redness, itching and visual disturbance.  Respiratory: Negative for cough, chest tightness, shortness of breath and wheezing.   Cardiovascular: Negative for chest pain, palpitations and leg swelling.  Gastrointestinal: Negative for abdominal distention, abdominal pain, constipation, diarrhea, nausea and vomiting.       Dark stool and bleeding sometimes  Skin: Negative for color change, pallor and rash.  Neurological: Negative for dizziness, speech difficulty, weakness, light-headedness and headaches.  Psychiatric/Behavioral: Negative for agitation and sleep disturbance. The patient is not nervous/anxious.     Immunization History  Administered Date(s) Administered  . Fluad Quad(high  Dose 65+) 06/14/2019  . Hepatitis B 08/28/2006, 10/02/2007, 04/01/2008  . Hepatitis B, adult 06/03/2014, 07/04/2014  . Influenza Split 07/28/2011  . Influenza Whole 08/28/2006, 09/10/2007, 09/15/2008, 08/03/2009, 07/26/2010  . Influenza, High Dose Seasonal PF 07/04/2018  . Influenza,inj,Quad PF,6+ Mos 07/04/2014, 07/06/2015, 07/12/2016, 07/11/2017  . Influenza-Unspecified 06/24/2013  . PFIZER SARS-COV-2 Vaccination 11/16/2019  . Pneumococcal Conjugate-13 08/18/2016  . Pneumococcal Polysaccharide-23 08/28/2006, 07/28/2011, 05/30/2018  . Tdap 06/14/2019  . Zoster 06/03/2014  . Zoster Recombinat (Shingrix) 07/24/2017, 01/09/2018   Pertinent  Health Maintenance Due  Topic Date Due  . HEMOGLOBIN A1C  05/06/2020  . INFLUENZA VACCINE  05/24/2020  . OPHTHALMOLOGY EXAM  10/07/2020  . LIPID PANEL  12/20/2020  . FOOT EXAM  12/30/2020  . COLONOSCOPY  10/01/2022  . PNA vac Low Risk Adult  Completed   Fall Risk  01/28/2020 11/19/2019 10/29/2019 08/15/2019 08/06/2019  Falls in the past year? 0 0 0 0 0  Comment - - - - -  Number falls in past yr: 0 0 0 0 0  Comment - - - - -  Injury  with Fall? 0 0 0 0 0  Comment - - - - -  Risk Factor Category  - - - - -  Risk for fall due to : - - - - -  Risk for fall due to: Comment - - - - -  Follow up - - - - -    Vitals:   03/19/20 1650  BP: 100/60  Pulse: 87  Resp: 18  Temp: 98 F (36.7 C)  SpO2: 98%  Weight: 175 lb (79.4 kg)  Height: 6\' 2"  (1.88 m)   Body mass index is 22.47 kg/m. Physical Exam Constitutional:      General: He is not in acute distress.    Appearance: He is normal weight. He is not ill-appearing.  HENT:     Head: Normocephalic.     Mouth/Throat:     Mouth: Mucous membranes are moist.     Pharynx: Oropharynx is clear. No oropharyngeal exudate or posterior oropharyngeal erythema.  Eyes:     General: No scleral icterus.       Right eye: No discharge.        Left eye: No discharge.     Extraocular Movements: Extraocular movements intact.     Conjunctiva/sclera: Conjunctivae normal.     Pupils: Pupils are equal, round, and reactive to light.     Comments: Corrective lens in place   Neck:     Vascular: No carotid bruit.  Cardiovascular:     Rate and Rhythm: Normal rate and regular rhythm.     Pulses: Normal pulses.     Heart sounds: Normal heart sounds. No murmur. No friction rub. No gallop.      Comments: Left hemodialysis AVF positive thrill and bruit.no signs of infection or bleeding noted   Pulmonary:     Effort: Pulmonary effort is normal. No respiratory distress.     Breath sounds: Normal breath sounds. No wheezing, rhonchi or rales.  Chest:     Chest wall: No tenderness.  Abdominal:     General: Bowel sounds are normal. There is no distension.     Palpations: Abdomen is soft. There is no mass.     Tenderness: There is no abdominal tenderness. There is no right CVA tenderness, left CVA tenderness, guarding or rebound.  Genitourinary:    Comments: Decline Rectal exam  Musculoskeletal:        General: No swelling  or tenderness. Normal range of motion.     Cervical back: Normal range  of motion. No rigidity or tenderness.     Right lower leg: No edema.     Left lower leg: No edema.  Lymphadenopathy:     Cervical: No cervical adenopathy.  Skin:    General: Skin is warm.     Coloration: Skin is not pale.     Findings: No bruising, erythema or rash.  Neurological:     Mental Status: He is alert and oriented to person, place, and time.     Cranial Nerves: No cranial nerve deficit.     Motor: No weakness.     Gait: Gait normal.  Psychiatric:        Mood and Affect: Mood normal.        Behavior: Behavior normal.        Thought Content: Thought content normal.        Judgment: Judgment normal.    Labs reviewed: Recent Labs    07/21/19 0928 07/21/19 0943 07/31/19 0318 07/31/19 0318 08/01/19 0325 10/03/19 0926 12/21/19 0352 12/21/19 0352 12/23/19 0832 02/05/20 0000 03/11/20 1730  NA 140   < > 131*   < > 132*   < > 138   < > 136 141 134*  K 4.5   < > 4.1   < > 3.8   < > 3.8   < > 3.8 4.1 3.1*  CL 108   < > 96*   < > 95*   < > 99   < > 101 103 93*  CO2 23   < > 18*   < > 20*   < > 26  --  22  --  27  GLUCOSE 110*   < > 202*   < > 205*   < > 83  --  199*  --  168*  BUN 72*   < > 117*   < > 65*   < > 37*   < > 80* 65* 32*  CREATININE 5.55*   < > 7.77*   < > 5.73*   < > 4.47*   < > 7.28* 5.6* 3.15*  CALCIUM 9.0   < > 8.4*   < > 8.5*   < > 8.9   < > 8.6* 9.1 8.0*  MG 1.8  --   --   --   --   --   --   --   --   --   --   PHOS 5.1*   < > 8.2*  --  6.5*  --   --   --  5.1*  --   --    < > = values in this interval not displayed.   Recent Labs    07/21/19 0928 07/21/19 1821 07/23/19 0608 07/24/19 0241 10/21/19 1035 10/21/19 1035 11/09/19 0000 12/23/19 0832 02/05/20 0000  AST 95*  --  47*  --  177*  --   --   --   --   ALT 51*  --  34  --  214*  --   --   --   --   ALKPHOS 42  --  43  --  86  --  74  --  90  BILITOT 0.7  --  1.2  --  1.0  --   --   --   --   PROT 7.7  --  7.6  --  7.4  --   --   --   --  ALBUMIN 3.2*   < > 3.0*   < > 3.3*   < > 5.0  2.5* 3.6   < > = values in this interval not displayed.   Recent Labs    07/29/19 0357 07/30/19 1219 12/17/19 1042 12/19/19 0547 12/20/19 0628 12/20/19 0628 12/21/19 0352 12/21/19 1327 12/23/19 0657 02/05/20 0000 03/11/20 1730  WBC 6.7   < > 4.5   < > 4.8   < > 5.6  --   --  7.7 4.0  NEUTROABS 4.4  --  2.8  --   --   --   --   --   --   --  2.4  HGB 8.2*   < > 8.4*   < > 7.3*   < > 9.0*   < > 8.3* 7.9* 7.0*  HCT 24.4*   < > 26.0*   < > 23.1*   < > 28.3*   < > 26.9* 27* 23.0*  MCV 96.1   < > 81   < > 86.5  --  87.9  --   --   --  99.1  PLT 205   < > 145*   < > 147*  --  158  --   --   --  200   < > = values in this interval not displayed.   Lab Results  Component Value Date   TSH 0.18 (A) 02/05/2020   Lab Results  Component Value Date   HGBA1C 4.4 02/05/2020   Lab Results  Component Value Date   CHOL 65 12/21/2019   HDL 17 (L) 12/21/2019   LDLCALC 29 12/21/2019   TRIG 95 12/21/2019   CHOLHDL 3.8 12/21/2019    Significant Diagnostic Results in last 30 days:  DG Chest 2 View  Result Date: 03/11/2020 CLINICAL DATA:  Shortness of breath EXAM: CHEST - 2 VIEW COMPARISON:  10/21/2019 FINDINGS: Dialysis catheter has been removed since the prior exam. Cardiac shadow remains enlarged. Aortic calcifications are again noted. Postsurgical changes are again seen. Old rib fractures are noted on the right with healing stable from the prior exam. Lungs are well aerated bilaterally without focal infiltrate. Mild interstitial prominence and vascular congestion is noted likely related to a degree of volume overload. IMPRESSION: Changes consistent with mild fluid overload. No other acute abnormality is noted. Electronically Signed   By: Inez Catalina M.D.   On: 03/11/2020 16:56    Assessment/Plan  Anemia of chronic disease Hgb 7.0 status post ED visit post 1 PRBC transfusion with I.V Furosemide 160 mg.Asymptomatic. will recheck CBC/diff . - CBC/diff   - Decline rectal exam with occasional  dark stool.will follow up with Gastroenterologist. - Advised to Notify provider if symptoms worsen or fail to improve.    Family/ staff Communication: Reviewed plan of care with patient verbalized understanding.  Labs/tests ordered: - CBC/diff    Next Appointment: as needed if symptoms worsen or fail to improve.   [name], NP

## 2020-03-19 NOTE — Patient Instructions (Signed)
-   Notify provider if symptoms worsen or fail to improve. - CBC done today will call you with results

## 2020-03-20 DIAGNOSIS — Z992 Dependence on renal dialysis: Secondary | ICD-10-CM | POA: Diagnosis not present

## 2020-03-20 DIAGNOSIS — N2581 Secondary hyperparathyroidism of renal origin: Secondary | ICD-10-CM | POA: Diagnosis not present

## 2020-03-20 DIAGNOSIS — D509 Iron deficiency anemia, unspecified: Secondary | ICD-10-CM | POA: Diagnosis not present

## 2020-03-20 DIAGNOSIS — N186 End stage renal disease: Secondary | ICD-10-CM | POA: Diagnosis not present

## 2020-03-20 DIAGNOSIS — E1122 Type 2 diabetes mellitus with diabetic chronic kidney disease: Secondary | ICD-10-CM | POA: Diagnosis not present

## 2020-03-20 DIAGNOSIS — D631 Anemia in chronic kidney disease: Secondary | ICD-10-CM | POA: Diagnosis not present

## 2020-03-23 DIAGNOSIS — Z992 Dependence on renal dialysis: Secondary | ICD-10-CM | POA: Diagnosis not present

## 2020-03-23 DIAGNOSIS — I129 Hypertensive chronic kidney disease with stage 1 through stage 4 chronic kidney disease, or unspecified chronic kidney disease: Secondary | ICD-10-CM | POA: Diagnosis not present

## 2020-03-23 DIAGNOSIS — N2581 Secondary hyperparathyroidism of renal origin: Secondary | ICD-10-CM | POA: Diagnosis not present

## 2020-03-23 DIAGNOSIS — E1122 Type 2 diabetes mellitus with diabetic chronic kidney disease: Secondary | ICD-10-CM | POA: Diagnosis not present

## 2020-03-23 DIAGNOSIS — D509 Iron deficiency anemia, unspecified: Secondary | ICD-10-CM | POA: Diagnosis not present

## 2020-03-23 DIAGNOSIS — N186 End stage renal disease: Secondary | ICD-10-CM | POA: Diagnosis not present

## 2020-03-23 DIAGNOSIS — D631 Anemia in chronic kidney disease: Secondary | ICD-10-CM | POA: Diagnosis not present

## 2020-03-24 ENCOUNTER — Other Ambulatory Visit: Payer: Self-pay | Admitting: Nurse Practitioner

## 2020-03-24 DIAGNOSIS — E1169 Type 2 diabetes mellitus with other specified complication: Secondary | ICD-10-CM

## 2020-03-25 DIAGNOSIS — D631 Anemia in chronic kidney disease: Secondary | ICD-10-CM | POA: Diagnosis not present

## 2020-03-25 DIAGNOSIS — E1122 Type 2 diabetes mellitus with diabetic chronic kidney disease: Secondary | ICD-10-CM | POA: Diagnosis not present

## 2020-03-25 DIAGNOSIS — N2581 Secondary hyperparathyroidism of renal origin: Secondary | ICD-10-CM | POA: Diagnosis not present

## 2020-03-25 DIAGNOSIS — Z992 Dependence on renal dialysis: Secondary | ICD-10-CM | POA: Diagnosis not present

## 2020-03-25 DIAGNOSIS — N186 End stage renal disease: Secondary | ICD-10-CM | POA: Diagnosis not present

## 2020-03-25 DIAGNOSIS — D509 Iron deficiency anemia, unspecified: Secondary | ICD-10-CM | POA: Diagnosis not present

## 2020-03-25 NOTE — Telephone Encounter (Signed)
Medication refill requested and routed to [facility] for approval because of contraindication

## 2020-03-26 ENCOUNTER — Telehealth: Payer: Self-pay

## 2020-03-26 DIAGNOSIS — E1140 Type 2 diabetes mellitus with diabetic neuropathy, unspecified: Secondary | ICD-10-CM

## 2020-03-26 DIAGNOSIS — I10 Essential (primary) hypertension: Secondary | ICD-10-CM

## 2020-03-26 NOTE — Telephone Encounter (Signed)
Recommend checking your magnesium level and your electrolytes( BMP ) which can cause you to have cramping due to dialysis.can fax orders for labs to be done during dialysis. Take vitamin B complex one by mouth daily.  Current Gabapentin is at max dose due to kidney failure would not recommend higher doses.

## 2020-03-26 NOTE — Telephone Encounter (Signed)
Jacob [name] called and stated he is  Cramping really bad in both legs, finger are stuck together and his arm are in severe pain. He stated he had dialysis on yesterday and this morning he woke up this pain, He also stated he was in last week and saw Dinah and asked her if there is anything stronger than Gabapentin that he could  Take? She told him now however he feels like there has to be something else that he can take or increase his dosage. Please advise?

## 2020-03-27 DIAGNOSIS — E1122 Type 2 diabetes mellitus with diabetic chronic kidney disease: Secondary | ICD-10-CM | POA: Diagnosis not present

## 2020-03-27 DIAGNOSIS — Z992 Dependence on renal dialysis: Secondary | ICD-10-CM | POA: Diagnosis not present

## 2020-03-27 DIAGNOSIS — D631 Anemia in chronic kidney disease: Secondary | ICD-10-CM | POA: Diagnosis not present

## 2020-03-27 DIAGNOSIS — D509 Iron deficiency anemia, unspecified: Secondary | ICD-10-CM | POA: Diagnosis not present

## 2020-03-27 DIAGNOSIS — N2581 Secondary hyperparathyroidism of renal origin: Secondary | ICD-10-CM | POA: Diagnosis not present

## 2020-03-27 DIAGNOSIS — N186 End stage renal disease: Secondary | ICD-10-CM | POA: Diagnosis not present

## 2020-03-30 DIAGNOSIS — N186 End stage renal disease: Secondary | ICD-10-CM | POA: Diagnosis not present

## 2020-03-30 DIAGNOSIS — D631 Anemia in chronic kidney disease: Secondary | ICD-10-CM | POA: Diagnosis not present

## 2020-03-30 DIAGNOSIS — D509 Iron deficiency anemia, unspecified: Secondary | ICD-10-CM | POA: Diagnosis not present

## 2020-03-30 DIAGNOSIS — Z992 Dependence on renal dialysis: Secondary | ICD-10-CM | POA: Diagnosis not present

## 2020-03-30 DIAGNOSIS — E1122 Type 2 diabetes mellitus with diabetic chronic kidney disease: Secondary | ICD-10-CM | POA: Diagnosis not present

## 2020-03-30 DIAGNOSIS — N2581 Secondary hyperparathyroidism of renal origin: Secondary | ICD-10-CM | POA: Diagnosis not present

## 2020-04-01 DIAGNOSIS — D509 Iron deficiency anemia, unspecified: Secondary | ICD-10-CM | POA: Diagnosis not present

## 2020-04-01 DIAGNOSIS — N2581 Secondary hyperparathyroidism of renal origin: Secondary | ICD-10-CM | POA: Diagnosis not present

## 2020-04-01 DIAGNOSIS — E1122 Type 2 diabetes mellitus with diabetic chronic kidney disease: Secondary | ICD-10-CM | POA: Diagnosis not present

## 2020-04-01 DIAGNOSIS — D631 Anemia in chronic kidney disease: Secondary | ICD-10-CM | POA: Diagnosis not present

## 2020-04-01 DIAGNOSIS — N186 End stage renal disease: Secondary | ICD-10-CM | POA: Diagnosis not present

## 2020-04-01 DIAGNOSIS — Z992 Dependence on renal dialysis: Secondary | ICD-10-CM | POA: Diagnosis not present

## 2020-04-02 NOTE — Telephone Encounter (Signed)
Jacob [name] is going out to buy the Vitamin B complex. He also stated that he has been doubling his dose of Gabapentin to help with the pain. It is ok to fax an order for BMP to Dialysis

## 2020-04-02 NOTE — Telephone Encounter (Signed)
Faxed BMP order to patient's Dialysis center DX.: muscle cramps

## 2020-04-03 DIAGNOSIS — D631 Anemia in chronic kidney disease: Secondary | ICD-10-CM | POA: Diagnosis not present

## 2020-04-03 DIAGNOSIS — D509 Iron deficiency anemia, unspecified: Secondary | ICD-10-CM | POA: Diagnosis not present

## 2020-04-03 DIAGNOSIS — N186 End stage renal disease: Secondary | ICD-10-CM | POA: Diagnosis not present

## 2020-04-03 DIAGNOSIS — E1122 Type 2 diabetes mellitus with diabetic chronic kidney disease: Secondary | ICD-10-CM | POA: Diagnosis not present

## 2020-04-03 DIAGNOSIS — N2581 Secondary hyperparathyroidism of renal origin: Secondary | ICD-10-CM | POA: Diagnosis not present

## 2020-04-03 DIAGNOSIS — Z992 Dependence on renal dialysis: Secondary | ICD-10-CM | POA: Diagnosis not present

## 2020-04-06 ENCOUNTER — Other Ambulatory Visit: Payer: Self-pay | Admitting: Family

## 2020-04-06 DIAGNOSIS — I10 Essential (primary) hypertension: Secondary | ICD-10-CM

## 2020-04-06 DIAGNOSIS — N186 End stage renal disease: Secondary | ICD-10-CM | POA: Diagnosis not present

## 2020-04-06 DIAGNOSIS — D509 Iron deficiency anemia, unspecified: Secondary | ICD-10-CM | POA: Diagnosis not present

## 2020-04-06 DIAGNOSIS — E1122 Type 2 diabetes mellitus with diabetic chronic kidney disease: Secondary | ICD-10-CM | POA: Diagnosis not present

## 2020-04-06 DIAGNOSIS — N2581 Secondary hyperparathyroidism of renal origin: Secondary | ICD-10-CM | POA: Diagnosis not present

## 2020-04-06 DIAGNOSIS — D631 Anemia in chronic kidney disease: Secondary | ICD-10-CM | POA: Diagnosis not present

## 2020-04-06 DIAGNOSIS — Z794 Long term (current) use of insulin: Secondary | ICD-10-CM

## 2020-04-06 DIAGNOSIS — Z992 Dependence on renal dialysis: Secondary | ICD-10-CM | POA: Diagnosis not present

## 2020-04-06 NOTE — Telephone Encounter (Signed)
This is actually a patient of Dr Blanch Media, I think. The patient does not know. Who is to be following his anemia?

## 2020-04-06 NOTE — Telephone Encounter (Signed)
Patient called and stated that the Dialysis Center has not received the orders to draw patient's Magnesium and BMP. Needing the orders sent to them.   Also, patient stated that he is already taking Vitamin B Complex, RenaVite.   Please Advise.

## 2020-04-06 NOTE — Addendum Note (Signed)
Addended byMarlowe Sax C on: 04/06/2020 04:58 PM   Modules accepted: Orders

## 2020-04-06 NOTE — Telephone Encounter (Signed)
Patient calling to follow up on previous message please advise.

## 2020-04-06 NOTE — Telephone Encounter (Signed)
Magnesium level and BMP ordered to be faxed to patient's dialysis Center.

## 2020-04-07 ENCOUNTER — Other Ambulatory Visit: Payer: Self-pay

## 2020-04-07 ENCOUNTER — Ambulatory Visit (INDEPENDENT_AMBULATORY_CARE_PROVIDER_SITE_OTHER): Payer: Medicare Other | Admitting: Podiatry

## 2020-04-07 ENCOUNTER — Encounter: Payer: Self-pay | Admitting: Podiatry

## 2020-04-07 DIAGNOSIS — E08621 Diabetes mellitus due to underlying condition with foot ulcer: Secondary | ICD-10-CM | POA: Diagnosis not present

## 2020-04-07 DIAGNOSIS — M79675 Pain in left toe(s): Secondary | ICD-10-CM

## 2020-04-07 DIAGNOSIS — N186 End stage renal disease: Secondary | ICD-10-CM

## 2020-04-07 DIAGNOSIS — L97511 Non-pressure chronic ulcer of other part of right foot limited to breakdown of skin: Secondary | ICD-10-CM

## 2020-04-07 DIAGNOSIS — B351 Tinea unguium: Secondary | ICD-10-CM | POA: Diagnosis not present

## 2020-04-07 DIAGNOSIS — E11621 Type 2 diabetes mellitus with foot ulcer: Secondary | ICD-10-CM | POA: Diagnosis not present

## 2020-04-07 DIAGNOSIS — M79674 Pain in right toe(s): Secondary | ICD-10-CM | POA: Diagnosis not present

## 2020-04-07 DIAGNOSIS — E0842 Diabetes mellitus due to underlying condition with diabetic polyneuropathy: Secondary | ICD-10-CM | POA: Diagnosis not present

## 2020-04-07 DIAGNOSIS — L97529 Non-pressure chronic ulcer of other part of left foot with unspecified severity: Secondary | ICD-10-CM

## 2020-04-07 DIAGNOSIS — Z992 Dependence on renal dialysis: Secondary | ICD-10-CM

## 2020-04-07 MED ORDER — CADEXOMER IODINE 0.9 % EX GEL: 1.0000 "application " | Freq: Every day | CUTANEOUS | 0 refills | Status: DC | PRN

## 2020-04-07 NOTE — Telephone Encounter (Signed)
Pt called back very annoyed that he has not had a response.

## 2020-04-07 NOTE — Telephone Encounter (Signed)
This patient's hemoglobin was stable when I saw him in follow-up post bleed.  We are not monitoring his chronic anemia.  If having overt GI bleed then we need to see him of course or if having recurrent anemia then we need to see him. Sounds like hgb fell again recently so I am happy to see him.

## 2020-04-07 NOTE — Patient Instructions (Signed)
DRESSING CHANGES RIGHT FIFTH TOE AND LEFT FIRST TOE:    1. KEEP BOTH FEET DRY AT ALL TIMES!!!!  2. CLEANSE ULCER WITH SALINE.  3. DAB DRY WITH GAUZE SPONGE.  4. APPLY A LIGHT AMOUNT OF IODOSORB TO BASE OF ULCER.  5. APPLY OUTER DRESSING/BAND-AID AS INSTRUCTED.  7. DO NOT WALK BAREFOOT!!!  8.  IF YOU EXPERIENCE ANY FEVER, CHILLS, NIGHTSWEATS, NAUSEA OR VOMITING, ELEVATED OR LOW BLOOD SUGARS, REPORT TO EMERGENCY ROOM.  9. IF YOU EXPERIENCE INCREASED REDNESS, PAIN, SWELLING, DISCOLORATION, ODOR, PUS, DRAINAGE OR WARMTH OF YOUR FOOT, REPORT TO EMERGENCY ROOM.

## 2020-04-08 ENCOUNTER — Telehealth: Payer: Self-pay | Admitting: Cardiovascular Disease

## 2020-04-08 ENCOUNTER — Telehealth (HOSPITAL_COMMUNITY): Payer: Self-pay

## 2020-04-08 DIAGNOSIS — D509 Iron deficiency anemia, unspecified: Secondary | ICD-10-CM | POA: Diagnosis not present

## 2020-04-08 DIAGNOSIS — D631 Anemia in chronic kidney disease: Secondary | ICD-10-CM | POA: Diagnosis not present

## 2020-04-08 DIAGNOSIS — Z992 Dependence on renal dialysis: Secondary | ICD-10-CM | POA: Diagnosis not present

## 2020-04-08 DIAGNOSIS — N186 End stage renal disease: Secondary | ICD-10-CM | POA: Diagnosis not present

## 2020-04-08 DIAGNOSIS — N2581 Secondary hyperparathyroidism of renal origin: Secondary | ICD-10-CM | POA: Diagnosis not present

## 2020-04-08 DIAGNOSIS — E1122 Type 2 diabetes mellitus with diabetic chronic kidney disease: Secondary | ICD-10-CM | POA: Diagnosis not present

## 2020-04-08 NOTE — Telephone Encounter (Signed)
New Message  Pt wants Dr. Kyla Balzarine nurse to give him a call in regards to a colonoscopy. Still have dialysis 3 times a week and Jonna Coup is not giving him a straight answer. After dialysis, having a hard time using the restroom. Wants Dr. [name] to contact Dr. Henrene Pastor. Please call to discuss

## 2020-04-08 NOTE — Telephone Encounter (Signed)
Left message for patient to call back  

## 2020-04-08 NOTE — Telephone Encounter (Signed)
Spoke with patient, told him Paulas recommendation of scheduling an appointment to come in an be seen. he just repeatly kept saying "i want to know if i need a colonoscopy and that was not addressed." I stated that he could come in to see Nevin Bloodgood and discuss the options and he said "she isnt the one who would complete it so theres not point of talking with her." I told him I would put a message in to ask about a colonoscopy.

## 2020-04-08 NOTE — Telephone Encounter (Signed)
PV Navigator referral received via InBasket. Chart reviewed.   I was able to reach Mr Sanna via his mobile number to introduce myself and my role. Reason for referral is diabetic ulcers L hallux and R 4th submetatarsal head > 2 weeks. Appointment with DPM yesterday to debride callouses around both wounds exposing clean, healing tissue. Per notes, sites negative for s/s of infection. Please see office note dated 04/07/20 for additional details.  Patient has an appointment with DPM weekly for re-assessment and follow up debridement prn. Prescription for Iodosorb gel written at this appointment and  pt says he has filled the prescription and applied a clean dressing this morning. He also reports he is wearing his Darco shoe per recommendations. He denies any pain or discomfort of extremities at this time.   Last VAS Arterial Duplex 12/20/19 results "without evidence of pseudoaneurysm". VAS [place] LE arterial seg. 12/05/19 results "no evidence of arterial disease LE".  Medical history to include NIDDM, ESRD on hemodialysis, HTN, CAD, MI (2005), HIV+.  Patient is a current every day smoker and is not interested in smoking cessation at this time.   No additional recommendations at this time due to wounds appear to be healing. Will continue to follow patient via [facility] notes and follow up phone calls and will remain available to this patient should barriers or questions arise. I left patient my contact information should he need to reach me.   Thank you for this opportunity to work with this patient,  Cletis Media RN BSN [facility] [place] [phone]

## 2020-04-08 NOTE — Telephone Encounter (Signed)
Follow up   Patient returning phone call

## 2020-04-08 NOTE — Telephone Encounter (Signed)
Called the patient's number. No answer. Left this information on his voicemail.

## 2020-04-08 NOTE — Telephone Encounter (Signed)
Patient is confused on what he needs to do about getting a colonoscopy. Encouraged patient to get an appointment with GI and discuss all his concerns with them in person. Patient stated that dialysis told him he needs to find out where all his blood is going. Informed patient to start with GI and go from there. Patient agreed to plan and will call GI to get an appointment.

## 2020-04-08 NOTE — Progress Notes (Signed)
Subjective: Patient presents seen today for high risk foot care with h/o ulceration, NIDDM with ESRD on hemodialysis. He is seen for chronic callosities and cc of painful, discolored, thick toenails which interfere with daily activities. Pain is aggravated when wearing enclosed shoe gear. Pain is getting progressively worse and relieved with periodic professional debridement.  He states he is having pain on the plantar aspect of his right foot today. Denies any redness, drainage or swelling of area. Denies any fever, chills, nightsweats, nausea or vomiting.  Does complain of cramping after dialysis treatments which he will discuss with his Nephrologist.  [name], NP is his PCP.   Current Outpatient Medications on File Prior to Visit  Medication Sig Dispense Refill  . Methoxy PEG-Epoetin Beta (MIRCERA IJ) Mircera    . acetaminophen (TYLENOL) 500 MG tablet Take 1,000 mg by mouth every 6 (six) hours as needed for moderate pain.     [name] allopurinol (ZYLOPRIM) 100 MG tablet TAKE 1 TABLET(100 MG) BY MOUTH DAILY 90 tablet 1  . ANORO ELLIPTA 62.5-25 MCG/INH AEPB INHALE 1 PUFF BY MOUTH EVERY DAY 60 each 5  . atorvastatin (LIPITOR) 80 MG tablet TAKE 1 TABLET(80 MG) BY MOUTH DAILY 90 tablet 3  . B-D UF III MINI PEN NEEDLES 31G X 5 MM MISC USE FOUR TIMES DAILY 100 each 11  . BIKTARVY 50-200-25 MG TABS tablet TAKE 1 TABLET BY MOUTH DAILY 30 tablet 5  . carvedilol (COREG) 3.125 MG tablet Take 1 tablet (3.125 mg total) by mouth 2 (two) times daily. 180 tablet 3  . Choline Fenofibrate (FENOFIBRIC ACID) 135 MG CPDR TAKE 1 CAPSULE BY MOUTH DAILY 90 capsule 1  . colchicine 0.6 MG tablet Take 0.6 mg by mouth daily as needed (pain).    [name] diclofenac Sodium (VOLTAREN) 1 % GEL Apply 2 g topically 3 (three) times daily as needed (pain). 540 g 1  . gabapentin (NEURONTIN) 300 MG capsule TAKE 1 CAPSULE BY MOUTH THREE TIMES DAILY 270 capsule 1  . insulin lispro (HUMALOG KWIKPEN) 100 UNIT/ML KwikPen Inject 0.2ml  (20 Units total) into the skin 3 (three) times daily as needed for high blood sugar (above 150) 15 pen 1  . isosorbide mononitrate (IMDUR) 30 MG 24 hr tablet Take one tablet by mouth once daily 90 tablet 1  . Lancets (ONETOUCH DELICA PLUS [id number]) MISC Inject 1 Device as directed 3 (three) times daily. Dx: E11.40 300 each 3  . latanoprost (XALATAN) 0.005 % ophthalmic solution Place 1 drop into both eyes at bedtime.    . multivitamin (RENA-VIT) TABS tablet Take 1 tablet by mouth daily.    . nitroGLYCERIN (NITROSTAT) 0.3 MG SL tablet ONE TABLET UNDER TONGUE AS NEEDED FOR CHEST PAIN 25 tablet 4  . ONETOUCH ULTRA test strip USE TO TEST BLOOD SUGAR 3  TIMES DAILY 300 strip 1  . pantoprazole (PROTONIX) 40 MG tablet Take 1 tablet (40 mg total) by mouth 2 (two) times daily. 60 tablet 1  . sevelamer carbonate (RENVELA) 800 MG tablet Take 800 mg by mouth 3 (three) times daily with meals.    . simethicone (MYLICON) 80 MG chewable tablet Chew 1 tablet (80 mg total) by mouth 4 (four) times daily as needed for flatulence. 90 tablet 3  . ticagrelor (BRILINTA) 90 MG TABS tablet Take 1 tablet (90 mg total) by mouth 2 (two) times daily. 180 tablet 3  . timolol (BETIMOL) 0.5 % ophthalmic solution Apply to eye.    . timolol (TIMOPTIC) 0.5 %  ophthalmic solution Place 1 drop into both eyes every morning.     No current facility-administered medications on file prior to visit.     Allergies  Allergen Reactions  . Augmentin [Amoxicillin-Pot Clavulanate] Diarrhea    Severe  . Amphetamines Other (See Comments)    Unknown reaction type     Objective: There were no vitals filed for this visit.  69 y.o. African American male in NAD. AAO X 3.  Vascular Examination: Capillary fill time to digits <3 seconds b/l lower extremities. Palpable DP pulses b/l. Palpable PT pulses b/l. Pedal hair absent b/l. Skin temperature gradient within normal limits b/l.  Dermatological Examination: Pedal skin with normal turgor,  texture and tone bilaterally. No interdigital macerations bilaterally. Toenails 1-5 b/l elongated, discolored, dystrophic, thickened, crumbly with subungual debris and tenderness to dorsal palpation.    Wound Location: L hallux There is a moderate amount of devitalized tissue present in the wound. Predebridement Wound Measurement:  2.0 x 1.5 cm with elevation of about 0.5 cm Postdebridement Wound Measurement: 1.5 x 0.5 x 0.1 cm Wound Base: Mixed Granular/Fibrotic Peri-wound: Calloused Exudate: None: wound tissue dry Blood Loss during debridement: <1 cc('s). Description of tissue removed from ulceration today:  Devitalized hyperkeratosis. Sign(s) of clinical bacterial infection: no clinical signs of infection noted on examination today. Material in wound which inhibits healing/promotes adjacent tissue breakdown: devitalized hyperkeratosis.   Wound Location: submet head 4 right foot There is a moderate amount of devitalized tissue present in the wound. Predebridement Wound Measurement:  2.5  x 3.0 cm Postdebridement Wound Measurement: 3.0 x 3.0 x 0.1 cm. Wound Base: Mixed Granular/Fibrotic Peri-wound: Calloused Exudate: None: wound tissue dry Blood Loss during debridement: <1 cc('s). Description of tissue removed from ulceration today:  Devitalized hyperkeratosis. Sign(s) of clinical bacterial infection: no clinical signs of infection noted on examination today. Material in wound which inhibits healing/promotes adjacent tissue breakdown: exuberant devitalized hyperkeratosis.  Musculoskeletal Examination: Normal muscle strength 5/5 to all lower extremity muscle groups bilaterally. Hallux valgus with bunion deformity noted b/l lower extremities. Hammertoes noted to the 2-5 bilaterally.  Neurological Examination: Protective sensation diminished with 10g monofilament b/l. Vibratory sensation diminished b/l.  Assessment: No diagnosis found.  Plan: -Patient/POA/Family member was  evaluated and treated and all questions answered.  -Patient/POA/Family member educated on diagnosis and treatment plan of routine ulcer debridement/wound care.  -Ulceration debridement achieved utilizing sharp debridement with sterile scalpel blade.  -Today's ulcers size post-debridement: left hallux 1.5 x 0.5 x 0.1cm; submet head 4 right foot: 3.0 x 3.0 x 0.1cm  -Ulcerations cleansed with wound cleanser. Iodosorb Gel applied to base of ulceration and secured with light dressing. -Wound responded well to today's debridement. -Patient risk factors affecting healing of ulcer: foot deformity, NIDDM with neuropathy, ESRD on hemodialysis, recurrent ulceration -Frequency of debridement needed to achieve healing: weekly -[name] given written instructions on daily wound care for L hallux and submet head 4 right foot ulcerations. -A prescription for Iodosorb Gel was sent to his pharmacy for daily application for left hallux and submet head 4 right foot ulcerations. -He has [employer] shoes at home and was instructed to wear them daily. He may wear his diabetic shoes to an event on Saturday and was instructed to return to his [employer] shoes after event. -He was asked to bring in all diabetic insoles for modification by Pedorthist.  -Frequency of debridements needed to achieve healing: weekly. -No wound culture and sensitivity ordered today as there are no clinical signs of infection  to either wound. -No radiology ordered today: no suspected bony involvement. -No Labs ordered today: no subjective signs of infection noted; afebrile. -Toenails 1-5 b/l were debrided in length and girth with sterile nail nippers and dremel without iatrogenic bleeding.  -Patient to report any pedal injuries to medical professional immediately. -Patient was given written instructions on offloading of ulceration and daily dressing changes. Strict orders were given to keep foot dry. Patient was instructed to call immediately if any  signs or symptoms of infection arise.  -Patient instructed to report to emergency department with worsening appearance of ulcer/toe/foot, increased pain, foul odor, increased redness, swelling, drainage, fever, chills, nightsweats, nausea, vomiting, increased blood sugar.  -Patient/POA to call should there be question/concern in the interim.  Return in about 1 week (around 04/14/2020) for diabetic ulcer.  Marzetta Board, DPM

## 2020-04-09 ENCOUNTER — Telehealth: Payer: Self-pay

## 2020-04-09 NOTE — Telephone Encounter (Signed)
Spoke with the patient. He asks when is he due for a colonoscopy. Advised his recall date is 09/2022. He asks "Why 10 years?" Advised this was what his doctor had decided based on his pathology report. Asked if he was having problems such as seeing blood in his stools or having tarry, black stools. He states he always has dark stool "they give me iron over at the dialysis center." Offered his an appointment to come in to be evaluated for the need of earlier colonoscopy or for an EGD. Patient states he is not sure what he is supposed to do, that he was told "You need to find out where that blood is going." He is not sure how he is supposed to "figure that out." He had labs last week and tells me he will see Dr Jimmy Footman at the dialysis center tomorrow to review them. The patient wants me to ask Dr Deterding what the patient is supposed to be doing for his anemia. Again offered an appointment to discuss. He says he wants the doctors to talk with each other and agree. He feels everyone (medical staff) is just pushing the problem off onto someone else and expecting him (the patient) to fix it.

## 2020-04-10 DIAGNOSIS — E1122 Type 2 diabetes mellitus with diabetic chronic kidney disease: Secondary | ICD-10-CM | POA: Diagnosis not present

## 2020-04-10 DIAGNOSIS — N186 End stage renal disease: Secondary | ICD-10-CM | POA: Diagnosis not present

## 2020-04-10 DIAGNOSIS — Z992 Dependence on renal dialysis: Secondary | ICD-10-CM | POA: Diagnosis not present

## 2020-04-10 DIAGNOSIS — D631 Anemia in chronic kidney disease: Secondary | ICD-10-CM | POA: Diagnosis not present

## 2020-04-10 DIAGNOSIS — D509 Iron deficiency anemia, unspecified: Secondary | ICD-10-CM | POA: Diagnosis not present

## 2020-04-10 DIAGNOSIS — N2581 Secondary hyperparathyroidism of renal origin: Secondary | ICD-10-CM | POA: Diagnosis not present

## 2020-04-13 ENCOUNTER — Telehealth: Payer: Self-pay | Admitting: Nurse Practitioner

## 2020-04-13 DIAGNOSIS — D509 Iron deficiency anemia, unspecified: Secondary | ICD-10-CM | POA: Diagnosis not present

## 2020-04-13 DIAGNOSIS — D631 Anemia in chronic kidney disease: Secondary | ICD-10-CM | POA: Diagnosis not present

## 2020-04-13 DIAGNOSIS — N2581 Secondary hyperparathyroidism of renal origin: Secondary | ICD-10-CM | POA: Diagnosis not present

## 2020-04-13 DIAGNOSIS — N186 End stage renal disease: Secondary | ICD-10-CM | POA: Diagnosis not present

## 2020-04-13 DIAGNOSIS — E1122 Type 2 diabetes mellitus with diabetic chronic kidney disease: Secondary | ICD-10-CM | POA: Diagnosis not present

## 2020-04-13 DIAGNOSIS — Z992 Dependence on renal dialysis: Secondary | ICD-10-CM | POA: Diagnosis not present

## 2020-04-14 DIAGNOSIS — H401132 Primary open-angle glaucoma, bilateral, moderate stage: Secondary | ICD-10-CM | POA: Diagnosis not present

## 2020-04-14 NOTE — Telephone Encounter (Signed)
Pt is calling regarding this message.

## 2020-04-14 NOTE — Telephone Encounter (Signed)
Jacob [name] Can he be scheduled for a direct colonoscopy for his IDA? He is on dialysis and anti-coagulation. He was last seen in office in March. I have asked for records and I am waiting for that. The patient is calling about this today.

## 2020-04-15 DIAGNOSIS — N186 End stage renal disease: Secondary | ICD-10-CM | POA: Diagnosis not present

## 2020-04-15 DIAGNOSIS — D631 Anemia in chronic kidney disease: Secondary | ICD-10-CM | POA: Diagnosis not present

## 2020-04-15 DIAGNOSIS — D509 Iron deficiency anemia, unspecified: Secondary | ICD-10-CM | POA: Diagnosis not present

## 2020-04-15 DIAGNOSIS — Z992 Dependence on renal dialysis: Secondary | ICD-10-CM | POA: Diagnosis not present

## 2020-04-15 DIAGNOSIS — N2581 Secondary hyperparathyroidism of renal origin: Secondary | ICD-10-CM | POA: Diagnosis not present

## 2020-04-15 DIAGNOSIS — E1122 Type 2 diabetes mellitus with diabetic chronic kidney disease: Secondary | ICD-10-CM | POA: Diagnosis not present

## 2020-04-15 NOTE — Telephone Encounter (Signed)
Pt called regarding this msg please call him at [phone].

## 2020-04-15 NOTE — Telephone Encounter (Signed)
Spoke with Erskine Squibb at Dialysis center [phone]), she stated that records were faxed on 04/14/20, she was advised that we had not received the records, requested that the records be faxed again to [phone]. She stated that she would refax records, attentioned to me.

## 2020-04-16 ENCOUNTER — Other Ambulatory Visit: Payer: Self-pay

## 2020-04-16 ENCOUNTER — Encounter: Payer: Self-pay | Admitting: Podiatry

## 2020-04-16 ENCOUNTER — Ambulatory Visit (INDEPENDENT_AMBULATORY_CARE_PROVIDER_SITE_OTHER): Payer: Medicare Other | Admitting: Podiatry

## 2020-04-16 DIAGNOSIS — N186 End stage renal disease: Secondary | ICD-10-CM | POA: Diagnosis not present

## 2020-04-16 DIAGNOSIS — E1159 Type 2 diabetes mellitus with other circulatory complications: Secondary | ICD-10-CM

## 2020-04-16 DIAGNOSIS — E11621 Type 2 diabetes mellitus with foot ulcer: Secondary | ICD-10-CM | POA: Diagnosis not present

## 2020-04-16 DIAGNOSIS — Z992 Dependence on renal dialysis: Secondary | ICD-10-CM

## 2020-04-16 DIAGNOSIS — L97529 Non-pressure chronic ulcer of other part of left foot with unspecified severity: Secondary | ICD-10-CM | POA: Diagnosis not present

## 2020-04-16 DIAGNOSIS — L97511 Non-pressure chronic ulcer of other part of right foot limited to breakdown of skin: Secondary | ICD-10-CM

## 2020-04-16 DIAGNOSIS — E08621 Diabetes mellitus due to underlying condition with foot ulcer: Secondary | ICD-10-CM | POA: Diagnosis not present

## 2020-04-16 NOTE — Telephone Encounter (Signed)
Ok that is fine

## 2020-04-16 NOTE — Telephone Encounter (Addendum)
I have the records. Sending the records to be scanned in. He will have to be seen.  Going to reach out to him today.  Thanks for your help on this.

## 2020-04-16 NOTE — Telephone Encounter (Signed)
Noted, you're welcome!

## 2020-04-16 NOTE — Telephone Encounter (Signed)
Jacob [name] I have the most recent lab from the patient's dialysis center. He was seen by his nephrologist on 04/10/20.  I spoke with the patient. He wants to see Dr Henrene Pastor. Per the patient request, I have made an appointment in the first available opening.  Appointment card mailed to the patient.Mr Harland thanks me for the call.

## 2020-04-17 DIAGNOSIS — D509 Iron deficiency anemia, unspecified: Secondary | ICD-10-CM | POA: Diagnosis not present

## 2020-04-17 DIAGNOSIS — N2581 Secondary hyperparathyroidism of renal origin: Secondary | ICD-10-CM | POA: Diagnosis not present

## 2020-04-17 DIAGNOSIS — Z992 Dependence on renal dialysis: Secondary | ICD-10-CM | POA: Diagnosis not present

## 2020-04-17 DIAGNOSIS — N186 End stage renal disease: Secondary | ICD-10-CM | POA: Diagnosis not present

## 2020-04-17 DIAGNOSIS — D631 Anemia in chronic kidney disease: Secondary | ICD-10-CM | POA: Diagnosis not present

## 2020-04-17 DIAGNOSIS — E1122 Type 2 diabetes mellitus with diabetic chronic kidney disease: Secondary | ICD-10-CM | POA: Diagnosis not present

## 2020-04-20 DIAGNOSIS — D631 Anemia in chronic kidney disease: Secondary | ICD-10-CM | POA: Diagnosis not present

## 2020-04-20 DIAGNOSIS — E1122 Type 2 diabetes mellitus with diabetic chronic kidney disease: Secondary | ICD-10-CM | POA: Diagnosis not present

## 2020-04-20 DIAGNOSIS — D509 Iron deficiency anemia, unspecified: Secondary | ICD-10-CM | POA: Diagnosis not present

## 2020-04-20 DIAGNOSIS — Z992 Dependence on renal dialysis: Secondary | ICD-10-CM | POA: Diagnosis not present

## 2020-04-20 DIAGNOSIS — N186 End stage renal disease: Secondary | ICD-10-CM | POA: Diagnosis not present

## 2020-04-20 DIAGNOSIS — N2581 Secondary hyperparathyroidism of renal origin: Secondary | ICD-10-CM | POA: Diagnosis not present

## 2020-04-21 ENCOUNTER — Ambulatory Visit (INDEPENDENT_AMBULATORY_CARE_PROVIDER_SITE_OTHER): Payer: Medicare Other | Admitting: Podiatry

## 2020-04-21 ENCOUNTER — Other Ambulatory Visit: Payer: Self-pay

## 2020-04-21 ENCOUNTER — Ambulatory Visit: Payer: Medicare Other | Admitting: Podiatry

## 2020-04-21 ENCOUNTER — Encounter: Payer: Self-pay | Admitting: Podiatry

## 2020-04-21 DIAGNOSIS — L97511 Non-pressure chronic ulcer of other part of right foot limited to breakdown of skin: Secondary | ICD-10-CM

## 2020-04-21 DIAGNOSIS — E0842 Diabetes mellitus due to underlying condition with diabetic polyneuropathy: Secondary | ICD-10-CM

## 2020-04-21 DIAGNOSIS — L97529 Non-pressure chronic ulcer of other part of left foot with unspecified severity: Secondary | ICD-10-CM

## 2020-04-21 DIAGNOSIS — E08621 Diabetes mellitus due to underlying condition with foot ulcer: Secondary | ICD-10-CM | POA: Diagnosis not present

## 2020-04-21 DIAGNOSIS — E11621 Type 2 diabetes mellitus with foot ulcer: Secondary | ICD-10-CM

## 2020-04-21 DIAGNOSIS — Z992 Dependence on renal dialysis: Secondary | ICD-10-CM

## 2020-04-21 DIAGNOSIS — N186 End stage renal disease: Secondary | ICD-10-CM | POA: Diagnosis not present

## 2020-04-21 MED ORDER — MUPIROCIN 2 % EX OINT: TOPICAL_OINTMENT | CUTANEOUS | 1 refills | Status: DC

## 2020-04-22 DIAGNOSIS — D631 Anemia in chronic kidney disease: Secondary | ICD-10-CM | POA: Diagnosis not present

## 2020-04-22 DIAGNOSIS — N186 End stage renal disease: Secondary | ICD-10-CM | POA: Diagnosis not present

## 2020-04-22 DIAGNOSIS — Z992 Dependence on renal dialysis: Secondary | ICD-10-CM | POA: Diagnosis not present

## 2020-04-22 DIAGNOSIS — N2581 Secondary hyperparathyroidism of renal origin: Secondary | ICD-10-CM | POA: Diagnosis not present

## 2020-04-22 DIAGNOSIS — D509 Iron deficiency anemia, unspecified: Secondary | ICD-10-CM | POA: Diagnosis not present

## 2020-04-22 DIAGNOSIS — I129 Hypertensive chronic kidney disease with stage 1 through stage 4 chronic kidney disease, or unspecified chronic kidney disease: Secondary | ICD-10-CM | POA: Diagnosis not present

## 2020-04-22 DIAGNOSIS — E1122 Type 2 diabetes mellitus with diabetic chronic kidney disease: Secondary | ICD-10-CM | POA: Diagnosis not present

## 2020-04-22 NOTE — Progress Notes (Signed)
Subjective: Patient presents seen today for high risk foot care with h/o ulceration, NIDDM with ESRD on hemodialysis. He is seen for chronic callosities and cc of painful, discolored, thick toenails which interfere with daily activities. Pain is aggravated when wearing enclosed shoe gear. Pain is getting progressively worse and relieved with periodic professional debridement.  He states he is having pain on the plantar aspect of his right foot today. Denies any redness, drainage or swelling of area. Denies any fever, chills, nightsweats, nausea or vomiting.  He voices no new problems on today's visit. States he hopes his wounds are healed.  [name], NP is his PCP.   Current Outpatient Medications on File Prior to Visit  Medication Sig Dispense Refill   acetaminophen (TYLENOL) 500 MG tablet Take 1,000 mg by mouth every 6 (six) hours as needed for moderate pain.      allopurinol (ZYLOPRIM) 100 MG tablet TAKE 1 TABLET(100 MG) BY MOUTH DAILY 90 tablet 1   ANORO ELLIPTA 62.5-25 MCG/INH AEPB INHALE 1 PUFF BY MOUTH EVERY DAY 60 each 5   atorvastatin (LIPITOR) 80 MG tablet TAKE 1 TABLET(80 MG) BY MOUTH DAILY 90 tablet 3   B-D UF III MINI PEN NEEDLES 31G X 5 MM MISC USE FOUR TIMES DAILY 100 each 11   BIKTARVY 50-200-25 MG TABS tablet TAKE 1 TABLET BY MOUTH DAILY 30 tablet 5   cadexomer iodine (IODOSORB) 0.9 % gel Apply 1 application topically daily as needed for wound care (Apply to fifth right toe and first left toe daily). 40 g 0   carvedilol (COREG) 3.125 MG tablet Take 1 tablet (3.125 mg total) by mouth 2 (two) times daily. 180 tablet 3   Choline Fenofibrate (FENOFIBRIC ACID) 135 MG CPDR TAKE 1 CAPSULE BY MOUTH DAILY 90 capsule 1   colchicine 0.6 MG tablet Take 0.6 mg by mouth daily as needed (pain).     diclofenac Sodium (VOLTAREN) 1 % GEL Apply 2 g topically 3 (three) times daily as needed (pain). 540 g 1   gabapentin (NEURONTIN) 300 MG capsule TAKE 1 CAPSULE BY MOUTH THREE  TIMES DAILY 270 capsule 1   insulin lispro (HUMALOG KWIKPEN) 100 UNIT/ML KwikPen Inject 0.76ml (20 Units total) into the skin 3 (three) times daily as needed for high blood sugar (above 150) 15 pen 1   isosorbide mononitrate (IMDUR) 30 MG 24 hr tablet Take one tablet by mouth once daily 90 tablet 1   Lancets (ONETOUCH DELICA PLUS [id number]) MISC Inject 1 Device as directed 3 (three) times daily. Dx: E11.40 300 each 3   latanoprost (XALATAN) 0.005 % ophthalmic solution Place 1 drop into both eyes at bedtime.     Methoxy PEG-Epoetin Beta (MIRCERA IJ) Mircera     multivitamin (RENA-VIT) TABS tablet Take 1 tablet by mouth daily.     nitroGLYCERIN (NITROSTAT) 0.3 MG SL tablet ONE TABLET UNDER TONGUE AS NEEDED FOR CHEST PAIN 25 tablet 4   ONETOUCH ULTRA test strip USE TO TEST BLOOD SUGAR 3  TIMES DAILY 300 strip 1   pantoprazole (PROTONIX) 40 MG tablet Take 1 tablet (40 mg total) by mouth 2 (two) times daily. 60 tablet 1   sevelamer carbonate (RENVELA) 800 MG tablet Take 800 mg by mouth 3 (three) times daily with meals.     simethicone (MYLICON) 80 MG chewable tablet Chew 1 tablet (80 mg total) by mouth 4 (four) times daily as needed for flatulence. 90 tablet 3   ticagrelor (BRILINTA) 90 MG TABS tablet Take 1  tablet (90 mg total) by mouth 2 (two) times daily. 180 tablet 3   timolol (BETIMOL) 0.5 % ophthalmic solution Apply to eye.     timolol (TIMOPTIC) 0.5 % ophthalmic solution Place 1 drop into both eyes every morning.     No current facility-administered medications on file prior to visit.     Allergies  Allergen Reactions   Augmentin [Amoxicillin-Pot Clavulanate] Diarrhea    Severe   Amphetamines Other (See Comments)    Unknown reaction type     Objective: There were no vitals filed for this visit.  69 y.o. African American male in NAD. AAO X 3.  Vascular Examination: Capillary fill time to digits <3 seconds b/l lower extremities. Palpable DP pulses b/l. Palpable PT  pulses b/l. Pedal hair absent b/l. Skin temperature gradient within normal limits b/l.  Dermatological Examination:         Pedal skin with normal turgor, texture and tone bilaterally. No interdigital macerations bilaterally. Toenails 1-5 b/l elongated, discolored, dystrophic, thickened, crumbly with subungual debris and tenderness to dorsal palpation.    Wound Location: L hallux There is a moderate amount of devitalized tissue present in the wound. Predebridement Wound Measurement:  1.0 x 1.5 x 0.1 cm Postdebridement Wound Measurement: 1.0 x 0.4 x 0.2 cm Wound Base: Mixed Granular/Fibrotic Peri-wound: Calloused Exudate: None: wound tissue dry Blood Loss during debridement: 0 cc('s). Description of tissue removed from ulceration today:  Devitalized hyperkeratosis. Sign(s) of clinical bacterial infection: no clinical signs of infection noted on examination today. Material in wound which inhibits healing/promotes adjacent tissue breakdown: devitalized hyperkeratosis.   Wound Location: submet head 4 right foot There is a moderate amount of devitalized tissue present in the wound. Predebridement Wound Measurement:  0.1 x 0.3 x 0 cm Postdebridement Wound Measurement:0.1 x 0.3 x 0.2 cm Wound Base: Mixed Granular/Fibrotic Peri-wound: Calloused Exudate: None: wound tissue dry Blood Loss during debridement: 0 cc('s). Description of tissue removed from ulceration today:  Devitalized hyperkeratosis. Sign(s) of clinical bacterial infection: no clinical signs of infection noted on examination today. Material in wound which inhibits healing/promotes adjacent tissue breakdown: exuberant devitalized hyperkeratosis.  Musculoskeletal Examination: Normal muscle strength 5/5 to all lower extremity muscle groups bilaterally. Hallux valgus with bunion deformity noted b/l lower extremities. Hammertoes noted to the 2-5 bilaterally.  Neurological Examination: Protective sensation diminished with  10g monofilament b/l. Vibratory sensation diminished b/l.  Assessment: 1. Diabetic ulcer of left great toe ([place])   2. Diabetic ulcer of other part of right foot associated with diabetes mellitus due to underlying condition, limited to breakdown of skin ([place])   3. Type 2 diabetes mellitus with vascular disease ([place])   4. ESRD (end stage renal disease) on dialysis [facility])     Plan: -Patient/POA/Family member was evaluated and treated and all questions answered.  -Patient/POA/Family member educated on diagnosis and treatment plan of routine ulcer debridement/wound care.  -Ulceration debridement achieved utilizing sharp debridement with sterile scalpel blade.  -Ulcerations cleansed with wound cleanser. Iodosorb Gel applied to base of ulceration and secured with light dressing. -Wound responded well to today's debridement. -Patient risk factors affecting healing of ulcer: foot deformity, NIDDM with neuropathy, ESRD on hemodialysis, recurrent ulceration -Frequency of debridement needed to achieve healing: weekly -[name] given written instructions on daily wound care for L hallux and submet head 4 right foot ulcerations. -Continue Iodosorb Gel dressings once daily.  -His inserts were offloaded by our Pedorthist on today's visit. -Frequency of debridements needed to achieve healing: weekly. -No  wound culture and sensitivity ordered today as there are no clinical signs of infection to either wound. -No radiology ordered today: no suspected bony involvement. -No Labs ordered today: no subjective signs of infection noted; afebrile. -Patient was given written instructions on offloading of ulceration and daily dressing changes. Strict orders were given to keep foot dry. Patient was instructed to call immediately if any signs or symptoms of infection arise.  -Patient instructed to report to emergency department with worsening appearance of ulcer/toe/foot, increased pain, foul odor, increased  redness, swelling, drainage, fever, chills, nightsweats, nausea, vomiting, increased blood sugar.  -Patient/POA to call should there be question/concern in the interim.  Return in about 1 week (around 04/23/2020) for diabetic ulcer right.  Marzetta Board, DPM

## 2020-04-24 DIAGNOSIS — D631 Anemia in chronic kidney disease: Secondary | ICD-10-CM | POA: Diagnosis not present

## 2020-04-24 DIAGNOSIS — E1122 Type 2 diabetes mellitus with diabetic chronic kidney disease: Secondary | ICD-10-CM | POA: Diagnosis not present

## 2020-04-24 DIAGNOSIS — N186 End stage renal disease: Secondary | ICD-10-CM | POA: Diagnosis not present

## 2020-04-24 DIAGNOSIS — N2581 Secondary hyperparathyroidism of renal origin: Secondary | ICD-10-CM | POA: Diagnosis not present

## 2020-04-24 DIAGNOSIS — D509 Iron deficiency anemia, unspecified: Secondary | ICD-10-CM | POA: Diagnosis not present

## 2020-04-24 DIAGNOSIS — Z992 Dependence on renal dialysis: Secondary | ICD-10-CM | POA: Diagnosis not present

## 2020-04-25 NOTE — Progress Notes (Signed)
Subjective:  [name], male    DOB: 1951-09-14,  MRN: [id number]  69 y.o. male is seen for follow up ulcer L hallux and submet head 4 right foot.  Prescribed wound care regimen: Iodosorb Gel dressing daily.  Patient is using [employer].  Patient relates wound has improved.  Patient The patient is having no constitutional symptoms, denying fever, chills, anorexia, or weight loss.  He would like to know if our Pedorthist sells diabetic insoles only. He received his diabetic shoes from another vendor, but did not get custom insoles for his plantar callus right foot.  Past Medical History:  Diagnosis Date  . Acute respiratory failure ([place]) 03/01/2018  . Arthritis    "all over; mostly knees and back" (02/28/2018)  . CHF (congestive heart failure) ([facility])    not on any meds  . Chronic lower back pain    stenosis  . Community acquired pneumonia 09/06/2013  . COPD (chronic obstructive pulmonary disease) ([place])   . Coronary atherosclerosis of native coronary artery 2005   s/p surgery  . Drug abuse ([place])    hx; tested for cocaine as recently as 2/08. says he is not using drugs now - avoided defib. for this reason   . ESRD (end stage renal disease) ([place])    Hemo M-W-F- Richarda Blade  . GERD (gastroesophageal reflux disease)    takes OTC meds as needed  . GI bleeding 02/06/2019  . Glaucoma    uses eye drops daily  . Hepatitis B 1968   "tx'd w/isolation; caught it from toilet stools in gym"  . History of blood transfusion 03/01/2019  . History of colon polyps    benign  . History of gout    takes Allopurinol daily as well as Colchicine-if needed (02/28/2018)  . History of kidney stones   . HTN (hypertension)    takes Coreg,Imdur.and Apresoline daily  . Human immunodeficiency virus (HIV) disease ([place]) dx'd 1995   takes Genvoya daily  . Hyperlipidemia    takes Atorvastatin daily  . Ischemic cardiomyopathy   . Muscle spasm    takes Zanaflex as needed  . Myocardial  infarction ([place]) ~ 2004/2005  . Nocturia   . Peripheral neuropathy    takes gabapentin daily  . Pneumonia    "at least twice" (02/28/2018)  . Shortness of breath dyspnea    rarely but if notices it then with exertion  . Syphilis, unspecified   . Type II diabetes mellitus ([place]) 2004   Lantus daily.Average fasting blood sugar 125-199  . Wears glasses   . Wears partial dentures      Past Surgical History:  Procedure Laterality Date  . AV FISTULA PLACEMENT Left 08/02/2018   Procedure: ARTERIOVENOUS (AV) FISTULA CREATION  left arm radiocephlic;  Surgeon: Marty Heck, MD;  Location: [place];  Service: Vascular;  Laterality: Left;  . AV FISTULA PLACEMENT Left 08/01/2019   Procedure: LEFT BRACHIOCEPHALIC ARTERIOVENOUS (AV) FISTULA CREATION;  Surgeon: [name], MD;  Location: [place];  Service: Vascular;  Laterality: Left;  . BASCILIC VEIN TRANSPOSITION Left 10/03/2019   Procedure: BASILIC VEIN TRANSPOSITION LEFT SECOND STAGE;  Surgeon: [name], MD;  Location: [place];  Service: Vascular;  Laterality: Left;  . CARDIAC CATHETERIZATION  10/2002; 12/19/2004   [name] 03/08/2011  . COLONOSCOPY  2013   Dr.John Henrene Pastor   . CORONARY ARTERY BYPASS GRAFT  02/24/2003   CABG X2/notes 03/08/2011  . CORONARY STENT INTERVENTION N/A 12/19/2019   Procedure: CORONARY  STENT INTERVENTION;  Surgeon: Jettie Booze, MD;  Location: [facility];  Service: Cardiovascular;  Laterality: N/A;  . ESOPHAGOGASTRODUODENOSCOPY (EGD) WITH PROPOFOL N/A 02/08/2019   Procedure: ESOPHAGOGASTRODUODENOSCOPY (EGD) WITH PROPOFOL;  Surgeon: Milus Banister, MD;  Location: [place];  Service: Gastroenterology;  Laterality: N/A;  . ESOPHAGOGASTRODUODENOSCOPY (EGD) WITH PROPOFOL N/A 12/22/2019   Procedure: ESOPHAGOGASTRODUODENOSCOPY (EGD) WITH PROPOFOL;  Surgeon: Lavena Bullion, DO;  Location: [facility];  Service: Gastroenterology;  Laterality: N/A;  . HEMOSTASIS CLIP PLACEMENT  12/22/2019   Procedure:  HEMOSTASIS CLIP PLACEMENT;  Surgeon: Lavena Bullion, DO;  Location: [facility] [facility];  Service: Gastroenterology;;  . HOT HEMOSTASIS N/A 02/08/2019   Procedure: HOT HEMOSTASIS (ARGON PLASMA COAGULATION/BICAP);  Surgeon: Milus Banister, MD;  Location: [facility] ENDOSCOPY;  Service: Gastroenterology;  Laterality: N/A;  . HOT HEMOSTASIS N/A 12/22/2019   Procedure: HOT HEMOSTASIS (ARGON PLASMA COAGULATION/BICAP);  Surgeon: Lavena Bullion, DO;  Location: [facility];  Service: Gastroenterology;  Laterality: N/A;  . INTERTROCHANTERIC HIP FRACTURE SURGERY Left 11/2006   [name] 03/08/2011  . INTRAVASCULAR ULTRASOUND/IVUS N/A 12/19/2019   Procedure: Intravascular Ultrasound/IVUS;  Surgeon: Jettie Booze, MD;  Location: [place] [facility];  Service: Cardiovascular;  Laterality: N/A;  . IR FLUORO GUIDE CV LINE RIGHT  07/24/2019  . IR FLUORO GUIDE CV LINE RIGHT  07/30/2019  . IR US GUIDE VASC ACCESS RIGHT  07/24/2019  . IR US GUIDE VASC ACCESS RIGHT  07/30/2019  . LAPAROSCOPIC CHOLECYSTECTOMY  05/2006  . LIGATION OF COMPETING BRANCHES OF ARTERIOVENOUS FISTULA Left 11/05/2018   Procedure: LIGATION OF COMPETING BRANCHES OF ARTERIOVENOUS FISTULA  LEFT  ARM;  Surgeon: Marty Heck, MD;  Location: [place];  Service: Vascular;  Laterality: Left;  . LUMBAR LAMINECTOMY/DECOMPRESSION MICRODISCECTOMY N/A 02/29/2016   Procedure: Left L4-5 Lateral Recess Decompression, Removal Extradural Intraspinal Facet Cyst;  Surgeon: Marybelle Killings, MD;  Location: [place];  Service: Orthopedics;  Laterality: N/A;  . MULTIPLE TOOTH EXTRACTIONS    . ORIF MANDIBULAR FRACTURE Left 08/13/2004   ORIF of left body fracture mandible with KLS [place] 2.3-mm six hole/notes 03/08/2011  . RIGHT/LEFT HEART CATH AND CORONARY/GRAFT ANGIOGRAPHY N/A 12/19/2019   Procedure: RIGHT/LEFT HEART CATH AND CORONARY/GRAFT ANGIOGRAPHY;  Surgeon: Jettie Booze, MD;  Location: [place] [facility];  Service: Cardiovascular;  Laterality: N/A;  . SCLEROTHERAPY   12/22/2019   Procedure: SCLEROTHERAPY;  Surgeon: Lavena Bullion, DO;  Location: [facility] ENDOSCOPY;  Service: Gastroenterology;;     Current Outpatient Medications on File Prior to Visit  Medication Sig Dispense Refill  . acetaminophen (TYLENOL) 500 MG tablet Take 1,000 mg by mouth every 6 (six) hours as needed for moderate pain.     [name] allopurinol (ZYLOPRIM) 100 MG tablet TAKE 1 TABLET(100 MG) BY MOUTH DAILY 90 tablet 1  . ANORO ELLIPTA 62.5-25 MCG/INH AEPB INHALE 1 PUFF BY MOUTH EVERY DAY 60 each 5  . atorvastatin (LIPITOR) 80 MG tablet TAKE 1 TABLET(80 MG) BY MOUTH DAILY 90 tablet 3  . B-D UF III MINI PEN NEEDLES 31G X 5 MM MISC USE FOUR TIMES DAILY 100 each 11  . BIKTARVY 50-200-25 MG TABS tablet TAKE 1 TABLET BY MOUTH DAILY 30 tablet 5  . cadexomer iodine (IODOSORB) 0.9 % gel Apply 1 application topically daily as needed for wound care (Apply to fifth right toe and first left toe daily). 40 g 0  . carvedilol (COREG) 3.125 MG tablet Take 1 tablet (3.125 mg total) by mouth 2 (two) times daily.  180 tablet 3  . Choline Fenofibrate (FENOFIBRIC ACID) 135 MG CPDR TAKE 1 CAPSULE BY MOUTH DAILY 90 capsule 1  . colchicine 0.6 MG tablet Take 0.6 mg by mouth daily as needed (pain).    [name] diclofenac Sodium (VOLTAREN) 1 % GEL Apply 2 g topically 3 (three) times daily as needed (pain). 540 g 1  . gabapentin (NEURONTIN) 300 MG capsule TAKE 1 CAPSULE BY MOUTH THREE TIMES DAILY 270 capsule 1  . insulin lispro (HUMALOG KWIKPEN) 100 UNIT/ML KwikPen Inject 0.1ml (20 Units total) into the skin 3 (three) times daily as needed for high blood sugar (above 150) 15 pen 1  . isosorbide mononitrate (IMDUR) 30 MG 24 hr tablet Take one tablet by mouth once daily 90 tablet 1  . Lancets (ONETOUCH DELICA PLUS [id number]) MISC Inject 1 Device as directed 3 (three) times daily. Dx: E11.40 300 each 3  . latanoprost (XALATAN) 0.005 % ophthalmic solution Place 1 drop into both eyes at bedtime.    . Methoxy PEG-Epoetin Beta (MIRCERA  IJ) Mircera    . multivitamin (RENA-VIT) TABS tablet Take 1 tablet by mouth daily.    . nitroGLYCERIN (NITROSTAT) 0.3 MG SL tablet ONE TABLET UNDER TONGUE AS NEEDED FOR CHEST PAIN 25 tablet 4  . ONETOUCH ULTRA test strip USE TO TEST BLOOD SUGAR 3  TIMES DAILY 300 strip 1  . pantoprazole (PROTONIX) 40 MG tablet Take 1 tablet (40 mg total) by mouth 2 (two) times daily. 60 tablet 1  . sevelamer carbonate (RENVELA) 800 MG tablet Take 800 mg by mouth 3 (three) times daily with meals.    . simethicone (MYLICON) 80 MG chewable tablet Chew 1 tablet (80 mg total) by mouth 4 (four) times daily as needed for flatulence. 90 tablet 3  . ticagrelor (BRILINTA) 90 MG TABS tablet Take 1 tablet (90 mg total) by mouth 2 (two) times daily. 180 tablet 3  . timolol (BETIMOL) 0.5 % ophthalmic solution Apply to eye.    . timolol (TIMOPTIC) 0.5 % ophthalmic solution Place 1 drop into both eyes every morning.     No current facility-administered medications on file prior to visit.     Allergies  Allergen Reactions  . Augmentin [Amoxicillin-Pot Clavulanate] Diarrhea    Severe  . Amphetamines Other (See Comments)    Unknown reaction type     Family History  Problem Relation Age of Onset  . Heart failure Father   . Hypertension Father   . Diabetes Brother   . Heart attack Brother   . Alzheimer's disease Mother   . Stroke Sister   . Diabetes Sister   . Alzheimer's disease Sister   . Hypertension Brother   . Diabetes Brother   . Drug abuse Brother   . Colon cancer Neg Hx      Social History   Occupational History  . Occupation: retired  Tobacco Use  . Smoking status: Current Every Day Smoker    Packs/day: 0.50    Years: 43.00    Pack years: 21.50    Types: Cigarettes  . Smokeless tobacco: Never Used  Vaping Use  . Vaping Use: Never used  Substance and Sexual Activity  . Alcohol use: Not Currently    Alcohol/week: 12.0 standard drinks    Types: 12 Standard drinks or equivalent per week  . Drug  use: No    Types: Cocaine    Comment: hx of crack/cocaine 63yrs ago 10/01/2019- none  . Sexual activity: Not Currently     Immunization  History  Administered Date(s) Administered  . Fluad Quad(high Dose 65+) 06/14/2019  . Hepatitis B 08/28/2006, 10/02/2007, 04/01/2008  . Hepatitis B, adult 06/03/2014, 07/04/2014  . Influenza Split 07/28/2011  . Influenza Whole 08/28/2006, 09/10/2007, 09/15/2008, 08/03/2009, 07/26/2010  . Influenza, High Dose Seasonal PF 07/04/2018  . Influenza,inj,Quad PF,6+ Mos 07/04/2014, 07/06/2015, 07/12/2016, 07/11/2017  . Influenza-Unspecified 06/24/2013  . PFIZER SARS-COV-2 Vaccination 11/16/2019  . Pneumococcal Conjugate-13 08/18/2016  . Pneumococcal Polysaccharide-23 08/28/2006, 07/28/2011, 05/30/2018  . Tdap 06/14/2019  . Zoster 06/03/2014  . Zoster Recombinat (Shingrix) 07/24/2017, 01/09/2018   Objective:  Physical Exam: There were no vitals filed for this visit.   Jacob [name] is a pleasant 69 y.o. African American male in NAD. AAO x 3.  Vascular Examination: Capillary fill time to digits <3 seconds b/l lower extremities. Palpable pedal pulses b/l LE. Pedal hair absent. Lower extremity skin temperature gradient within normal limits. No pain with calf compression b/l.  Dermatological Examination: Pedal skin with normal turgor, texture and tone bilaterally. No open wounds bilaterally. No interdigital macerations bilaterally.  Wound Location: submet head 4 right foot There is a minimal amount of devitalized tissue present in the wound. Predebridement Wound Measurement:  1.0  x 1.0 cm. Postdebridement Wound Measurement: 1.0 x 1.0 x 0.1 cm  Wound Base: Early epithelialization Peri-wound: Normal Exudate: None: wound tissue dry Blood Loss during debridement: 0 cc('s). Material in wound which inhibits healing/promotes adjacent tissue breakdown: N/A Description of tissue removed from ulceration today: hyperkeratosis Sign(s) of clinical bacterial  infection: no clinical signs of infection noted on examination today.  No images are attached to the encounter.  Wound Location: L hallux There is a minimal amount of devitalized tissue present in the wound. Predebridement Wound Measurement:  1.5  x 1.0 cm. Postdebridement Wound Measurement: 1.5 x 1.0 x 0.1 cm with early epithelialization. Wound Base: early epithelialization Peri-wound: Normal Exudate: None: wound tissue dry Blood Loss during debridement: 0 cc('s). Material in wound which inhibits healing/promotes adjacent tissue breakdown: N/A Description of tissue removed from ulceration today:  hyperkeratosis Sign(s) of clinical bacterial infection: no clinical signs of infection noted on examination today.   Musculoskeletal Examination: Normal muscle strength 5/5 to all lower extremity muscle groups bilaterally. No pain crepitus or joint limitation noted with ROM b/l. Hallux valgus with bunion deformity noted b/l lower extremities. Hammertoes noted to the 2-5 bilaterally.  Neurological Examination: Protective sensation diminished with 10g monofilament b/l.  Hemoglobin A1C 02/05/2020 11/09/2019 06/04/2019  HGBA1C 4.4 5.0 9.2(H)  Some recent data might be hidden    Lab Results  Component Value Date   WBC 4.7 03/19/2020   HGB 8.2 (L) 03/19/2020   HCT 26.6 (L) 03/19/2020   MCV 100.0 03/19/2020   PLT 222 03/19/2020     Assessment:   1. Diabetic ulcer of left great toe ([place])   2. Diabetic ulcer of other part of right foot associated with diabetes mellitus due to underlying condition, limited to breakdown of skin ([place])    Plan:  -Patient was evaluated and treated and all questions answered.  -Patient/POA/Family member educated on diagnosis and treatment plan of routine ulcer debridement/wound care.  -Ulceration debridement left hallux and submet head 4 right foot achieved utilizing sharp excisional debridement with sterile scalpel blade.  Type/amount of devitalized tissue  removed: hyperkeratosis -Ulceration cleansed with wound cleanser. Iodosorb Gel applied to base of ulceration and secured with light dressing. -Wound responded well to today's debridement. Both ulcers have significantly improved. -Patient risk factors affecting healing of  ulcer: diabetes, diabetic neuropathy, foot deformity, ESRD on hemodialysis, recurrent ulceration -Prescription written for Mupirocin Ointment. Patient is to apply to left hallux and right submet head 4 ulcer once once daily and cover with dressing.  -Will have Pedorthist's office reach out to him with cost of custom diabetic insoles.  Return in about 3 weeks (around 05/12/2020) for diabetic ulcer/ right.  Marzetta Board, DPM

## 2020-04-28 ENCOUNTER — Ambulatory Visit: Payer: Medicare Other | Admitting: Podiatry

## 2020-04-29 DIAGNOSIS — N2581 Secondary hyperparathyroidism of renal origin: Secondary | ICD-10-CM | POA: Diagnosis not present

## 2020-04-29 DIAGNOSIS — D509 Iron deficiency anemia, unspecified: Secondary | ICD-10-CM | POA: Diagnosis not present

## 2020-04-29 DIAGNOSIS — N186 End stage renal disease: Secondary | ICD-10-CM | POA: Diagnosis not present

## 2020-04-29 DIAGNOSIS — Z992 Dependence on renal dialysis: Secondary | ICD-10-CM | POA: Diagnosis not present

## 2020-04-29 DIAGNOSIS — E1122 Type 2 diabetes mellitus with diabetic chronic kidney disease: Secondary | ICD-10-CM | POA: Diagnosis not present

## 2020-04-29 DIAGNOSIS — D631 Anemia in chronic kidney disease: Secondary | ICD-10-CM | POA: Diagnosis not present

## 2020-05-01 ENCOUNTER — Other Ambulatory Visit: Payer: Self-pay | Admitting: Family

## 2020-05-01 DIAGNOSIS — N2581 Secondary hyperparathyroidism of renal origin: Secondary | ICD-10-CM | POA: Diagnosis not present

## 2020-05-01 DIAGNOSIS — N186 End stage renal disease: Secondary | ICD-10-CM | POA: Diagnosis not present

## 2020-05-01 DIAGNOSIS — D509 Iron deficiency anemia, unspecified: Secondary | ICD-10-CM | POA: Diagnosis not present

## 2020-05-01 DIAGNOSIS — D631 Anemia in chronic kidney disease: Secondary | ICD-10-CM | POA: Diagnosis not present

## 2020-05-01 DIAGNOSIS — Z992 Dependence on renal dialysis: Secondary | ICD-10-CM | POA: Diagnosis not present

## 2020-05-01 DIAGNOSIS — E1122 Type 2 diabetes mellitus with diabetic chronic kidney disease: Secondary | ICD-10-CM | POA: Diagnosis not present

## 2020-05-01 NOTE — Telephone Encounter (Signed)
Patient requesting a refill on medication "Pantoprazole". Patient last had medication refilled on 12/23/2019 by Reino Bellis. Patient had 60 pills with 1 refill. Medication pend and sent to Marlowe Sax, NP due to PCP Dewaine Oats [name], NP being out of office. Please Advise.

## 2020-05-04 DIAGNOSIS — N186 End stage renal disease: Secondary | ICD-10-CM | POA: Diagnosis not present

## 2020-05-04 DIAGNOSIS — D509 Iron deficiency anemia, unspecified: Secondary | ICD-10-CM | POA: Diagnosis not present

## 2020-05-04 DIAGNOSIS — D631 Anemia in chronic kidney disease: Secondary | ICD-10-CM | POA: Diagnosis not present

## 2020-05-04 DIAGNOSIS — N2581 Secondary hyperparathyroidism of renal origin: Secondary | ICD-10-CM | POA: Diagnosis not present

## 2020-05-04 DIAGNOSIS — Z992 Dependence on renal dialysis: Secondary | ICD-10-CM | POA: Diagnosis not present

## 2020-05-04 DIAGNOSIS — E1122 Type 2 diabetes mellitus with diabetic chronic kidney disease: Secondary | ICD-10-CM | POA: Diagnosis not present

## 2020-05-05 ENCOUNTER — Telehealth: Payer: Self-pay

## 2020-05-05 NOTE — Telephone Encounter (Signed)
FYI: Patient called to let Sherrie Mustache NP his PCP know that he is still having the cramps even though he has stopped taking his Lipitor as advised to do by his kidney doctor I asked if he would like to schedule an appointment to be seen to determine if something else could be done to help him with this situation he refused and said he just wanted to let her know but he is fine

## 2020-05-06 DIAGNOSIS — D631 Anemia in chronic kidney disease: Secondary | ICD-10-CM | POA: Diagnosis not present

## 2020-05-06 DIAGNOSIS — E1122 Type 2 diabetes mellitus with diabetic chronic kidney disease: Secondary | ICD-10-CM | POA: Diagnosis not present

## 2020-05-06 DIAGNOSIS — Z992 Dependence on renal dialysis: Secondary | ICD-10-CM | POA: Diagnosis not present

## 2020-05-06 DIAGNOSIS — N186 End stage renal disease: Secondary | ICD-10-CM | POA: Diagnosis not present

## 2020-05-06 DIAGNOSIS — N2581 Secondary hyperparathyroidism of renal origin: Secondary | ICD-10-CM | POA: Diagnosis not present

## 2020-05-06 DIAGNOSIS — D509 Iron deficiency anemia, unspecified: Secondary | ICD-10-CM | POA: Diagnosis not present

## 2020-05-08 DIAGNOSIS — D509 Iron deficiency anemia, unspecified: Secondary | ICD-10-CM | POA: Diagnosis not present

## 2020-05-08 DIAGNOSIS — D631 Anemia in chronic kidney disease: Secondary | ICD-10-CM | POA: Diagnosis not present

## 2020-05-08 DIAGNOSIS — N2581 Secondary hyperparathyroidism of renal origin: Secondary | ICD-10-CM | POA: Diagnosis not present

## 2020-05-08 DIAGNOSIS — Z992 Dependence on renal dialysis: Secondary | ICD-10-CM | POA: Diagnosis not present

## 2020-05-08 DIAGNOSIS — E1122 Type 2 diabetes mellitus with diabetic chronic kidney disease: Secondary | ICD-10-CM | POA: Diagnosis not present

## 2020-05-08 DIAGNOSIS — N186 End stage renal disease: Secondary | ICD-10-CM | POA: Diagnosis not present

## 2020-05-11 DIAGNOSIS — D631 Anemia in chronic kidney disease: Secondary | ICD-10-CM | POA: Diagnosis not present

## 2020-05-11 DIAGNOSIS — Z992 Dependence on renal dialysis: Secondary | ICD-10-CM | POA: Diagnosis not present

## 2020-05-11 DIAGNOSIS — N186 End stage renal disease: Secondary | ICD-10-CM | POA: Diagnosis not present

## 2020-05-11 DIAGNOSIS — E1122 Type 2 diabetes mellitus with diabetic chronic kidney disease: Secondary | ICD-10-CM | POA: Diagnosis not present

## 2020-05-11 DIAGNOSIS — N2581 Secondary hyperparathyroidism of renal origin: Secondary | ICD-10-CM | POA: Diagnosis not present

## 2020-05-11 DIAGNOSIS — D509 Iron deficiency anemia, unspecified: Secondary | ICD-10-CM | POA: Diagnosis not present

## 2020-05-12 ENCOUNTER — Encounter: Payer: Self-pay | Admitting: Podiatry

## 2020-05-12 ENCOUNTER — Other Ambulatory Visit: Payer: Self-pay

## 2020-05-12 ENCOUNTER — Encounter: Payer: Medicare Other | Admitting: Orthotics

## 2020-05-12 ENCOUNTER — Ambulatory Visit (INDEPENDENT_AMBULATORY_CARE_PROVIDER_SITE_OTHER): Payer: Medicare Other | Admitting: Podiatry

## 2020-05-12 DIAGNOSIS — Z8631 Personal history of diabetic foot ulcer: Secondary | ICD-10-CM | POA: Diagnosis not present

## 2020-05-12 DIAGNOSIS — E0842 Diabetes mellitus due to underlying condition with diabetic polyneuropathy: Secondary | ICD-10-CM | POA: Diagnosis not present

## 2020-05-13 DIAGNOSIS — N186 End stage renal disease: Secondary | ICD-10-CM | POA: Diagnosis not present

## 2020-05-13 DIAGNOSIS — D631 Anemia in chronic kidney disease: Secondary | ICD-10-CM | POA: Diagnosis not present

## 2020-05-13 DIAGNOSIS — N2581 Secondary hyperparathyroidism of renal origin: Secondary | ICD-10-CM | POA: Diagnosis not present

## 2020-05-13 DIAGNOSIS — D509 Iron deficiency anemia, unspecified: Secondary | ICD-10-CM | POA: Diagnosis not present

## 2020-05-13 DIAGNOSIS — Z992 Dependence on renal dialysis: Secondary | ICD-10-CM | POA: Diagnosis not present

## 2020-05-13 DIAGNOSIS — E1122 Type 2 diabetes mellitus with diabetic chronic kidney disease: Secondary | ICD-10-CM | POA: Diagnosis not present

## 2020-05-15 DIAGNOSIS — E1122 Type 2 diabetes mellitus with diabetic chronic kidney disease: Secondary | ICD-10-CM | POA: Diagnosis not present

## 2020-05-15 DIAGNOSIS — N186 End stage renal disease: Secondary | ICD-10-CM | POA: Diagnosis not present

## 2020-05-15 DIAGNOSIS — Z992 Dependence on renal dialysis: Secondary | ICD-10-CM | POA: Diagnosis not present

## 2020-05-15 DIAGNOSIS — D509 Iron deficiency anemia, unspecified: Secondary | ICD-10-CM | POA: Diagnosis not present

## 2020-05-15 DIAGNOSIS — D631 Anemia in chronic kidney disease: Secondary | ICD-10-CM | POA: Diagnosis not present

## 2020-05-15 DIAGNOSIS — N2581 Secondary hyperparathyroidism of renal origin: Secondary | ICD-10-CM | POA: Diagnosis not present

## 2020-05-15 NOTE — Progress Notes (Signed)
Subjective:  [name], male    DOB: 1951/05/03,  MRN: [id number]  69 y.o. male is seen for follow up ulcer L hallux and submet head 4 right foot.  Prescribed wound care regimen: Iodosorb Gel dressing daily.  Patient is using [employer].  Patient relates wound has improved.  Patient The patient is having no constitutional symptoms, denying fever, chills, anorexia, or weight loss.  He would like to know if our Pedorthist sells diabetic insoles only. He received his diabetic shoes from another vendor, but did not get custom insoles for his plantar callus right foot.  Past Medical History:  Diagnosis Date  . Acute respiratory failure ([place]) 03/01/2018  . Arthritis    "all over; mostly knees and back" (02/28/2018)  . CHF (congestive heart failure) ([facility])    not on any meds  . Chronic lower back pain    stenosis  . Community acquired pneumonia 09/06/2013  . COPD (chronic obstructive pulmonary disease) ([place])   . Coronary atherosclerosis of native coronary artery 2005   s/p surgery  . Drug abuse ([place])    hx; tested for cocaine as recently as 2/08. says he is not using drugs now - avoided defib. for this reason   . ESRD (end stage renal disease) ([place])    Hemo M-W-F- Richarda Blade  . GERD (gastroesophageal reflux disease)    takes OTC meds as needed  . GI bleeding 02/06/2019  . Glaucoma    uses eye drops daily  . Hepatitis B 1968   "tx'd w/isolation; caught it from toilet stools in gym"  . History of blood transfusion 03/01/2019  . History of colon polyps    benign  . History of gout    takes Allopurinol daily as well as Colchicine-if needed (02/28/2018)  . History of kidney stones   . HTN (hypertension)    takes Coreg,Imdur.and Apresoline daily  . Human immunodeficiency virus (HIV) disease ([place]) dx'd 1995   takes Genvoya daily  . Hyperlipidemia    takes Atorvastatin daily  . Ischemic cardiomyopathy   . Muscle spasm    takes Zanaflex as needed  . Myocardial  infarction ([place]) ~ 2004/2005  . Nocturia   . Peripheral neuropathy    takes gabapentin daily  . Pneumonia    "at least twice" (02/28/2018)  . Shortness of breath dyspnea    rarely but if notices it then with exertion  . Syphilis, unspecified   . Type II diabetes mellitus ([place]) 2004   Lantus daily.Average fasting blood sugar 125-199  . Wears glasses   . Wears partial dentures      Past Surgical History:  Procedure Laterality Date  . AV FISTULA PLACEMENT Left 08/02/2018   Procedure: ARTERIOVENOUS (AV) FISTULA CREATION  left arm radiocephlic;  Surgeon: Marty Heck, MD;  Location: [facility];  Service: Vascular;  Laterality: Left;  . AV FISTULA PLACEMENT Left 08/01/2019   Procedure: LEFT BRACHIOCEPHALIC ARTERIOVENOUS (AV) FISTULA CREATION;  Surgeon: [name], MD;  Location: [facility];  Service: Vascular;  Laterality: Left;  . BASCILIC VEIN TRANSPOSITION Left 10/03/2019   Procedure: BASILIC VEIN TRANSPOSITION LEFT SECOND STAGE;  Surgeon: [name], MD;  Location: [facility];  Service: Vascular;  Laterality: Left;  . CARDIAC CATHETERIZATION  10/2002; 12/19/2004   [name] 03/08/2011  . COLONOSCOPY  2013   Dr.John Henrene Pastor   . CORONARY ARTERY BYPASS GRAFT  02/24/2003   CABG X2/notes 03/08/2011  . CORONARY STENT INTERVENTION N/A 12/19/2019   Procedure: CORONARY  STENT INTERVENTION;  Surgeon: Jettie Booze, MD;  Location: [facility];  Service: Cardiovascular;  Laterality: N/A;  . ESOPHAGOGASTRODUODENOSCOPY (EGD) WITH PROPOFOL N/A 02/08/2019   Procedure: ESOPHAGOGASTRODUODENOSCOPY (EGD) WITH PROPOFOL;  Surgeon: Milus Banister, MD;  Location: [facility];  Service: Gastroenterology;  Laterality: N/A;  . ESOPHAGOGASTRODUODENOSCOPY (EGD) WITH PROPOFOL N/A 12/22/2019   Procedure: ESOPHAGOGASTRODUODENOSCOPY (EGD) WITH PROPOFOL;  Surgeon: Lavena Bullion, DO;  Location: [facility];  Service: Gastroenterology;  Laterality: N/A;  . HEMOSTASIS CLIP PLACEMENT  12/22/2019   Procedure:  HEMOSTASIS CLIP PLACEMENT;  Surgeon: Lavena Bullion, DO;  Location: [facility];  Service: Gastroenterology;;  . HOT HEMOSTASIS N/A 02/08/2019   Procedure: HOT HEMOSTASIS (ARGON PLASMA COAGULATION/BICAP);  Surgeon: Milus Banister, MD;  Location: [facility];  Service: Gastroenterology;  Laterality: N/A;  . HOT HEMOSTASIS N/A 12/22/2019   Procedure: HOT HEMOSTASIS (ARGON PLASMA COAGULATION/BICAP);  Surgeon: Lavena Bullion, DO;  Location: [facility] ENDOSCOPY;  Service: Gastroenterology;  Laterality: N/A;  . INTERTROCHANTERIC HIP FRACTURE SURGERY Left 11/2006   [name] 03/08/2011  . INTRAVASCULAR ULTRASOUND/IVUS N/A 12/19/2019   Procedure: Intravascular Ultrasound/IVUS;  Surgeon: Jettie Booze, MD;  Location: [place] [facility] [facility];  Service: Cardiovascular;  Laterality: N/A;  . IR FLUORO GUIDE CV LINE RIGHT  07/24/2019  . IR FLUORO GUIDE CV LINE RIGHT  07/30/2019  . IR US GUIDE VASC ACCESS RIGHT  07/24/2019  . IR US GUIDE VASC ACCESS RIGHT  07/30/2019  . LAPAROSCOPIC CHOLECYSTECTOMY  05/2006  . LIGATION OF COMPETING BRANCHES OF ARTERIOVENOUS FISTULA Left 11/05/2018   Procedure: LIGATION OF COMPETING BRANCHES OF ARTERIOVENOUS FISTULA  LEFT  ARM;  Surgeon: Marty Heck, MD;  Location: [place];  Service: Vascular;  Laterality: Left;  . LUMBAR LAMINECTOMY/DECOMPRESSION MICRODISCECTOMY N/A 02/29/2016   Procedure: Left L4-5 Lateral Recess Decompression, Removal Extradural Intraspinal Facet Cyst;  Surgeon: Marybelle Killings, MD;  Location: [facility];  Service: Orthopedics;  Laterality: N/A;  . MULTIPLE TOOTH EXTRACTIONS    . ORIF MANDIBULAR FRACTURE Left 08/13/2004   ORIF of left body fracture mandible with KLS Martin 2.3-mm six hole/notes 03/08/2011  . RIGHT/LEFT HEART CATH AND CORONARY/GRAFT ANGIOGRAPHY N/A 12/19/2019   Procedure: RIGHT/LEFT HEART CATH AND CORONARY/GRAFT ANGIOGRAPHY;  Surgeon: Jettie Booze, MD;  Location: [place] [facility];  Service: Cardiovascular;  Laterality: N/A;  . SCLEROTHERAPY   12/22/2019   Procedure: SCLEROTHERAPY;  Surgeon: Lavena Bullion, DO;  Location: [facility]OS[facility];  Service: Gastroenterology;;     Current Outpatient Medications on File Prior to Visit  Medication Sig Dispense Refill  . acetaminophen (TYLENOL) 500 MG tablet Take 1,000 mg by mouth every 6 (six) hours as needed for moderate pain.     [name] allopurinol (ZYLOPRIM) 100 MG tablet TAKE 1 TABLET(100 MG) BY MOUTH DAILY 90 tablet 1  . ANORO ELLIPTA 62.5-25 MCG/INH AEPB INHALE 1 PUFF BY MOUTH EVERY DAY 60 each 5  . atorvastatin (LIPITOR) 80 MG tablet TAKE 1 TABLET(80 MG) BY MOUTH DAILY 90 tablet 3  . B-D UF III MINI PEN NEEDLES 31G X 5 MM MISC USE FOUR TIMES DAILY 100 each 11  . BIKTARVY 50-200-25 MG TABS tablet TAKE 1 TABLET BY MOUTH DAILY 30 tablet 5  . cadexomer iodine (IODOSORB) 0.9 % gel Apply 1 application topically daily as needed for wound care (Apply to fifth right toe and first left toe daily). 40 g 0  . carvedilol (COREG) 3.125 MG tablet Take 1 tablet (3.125 mg total) by mouth 2 (two) times daily.  180 tablet 3  . Choline Fenofibrate (FENOFIBRIC ACID) 135 MG CPDR TAKE 1 CAPSULE BY MOUTH DAILY 90 capsule 1  . colchicine 0.6 MG tablet Take 0.6 mg by mouth daily as needed (pain).    [name] diclofenac Sodium (VOLTAREN) 1 % GEL Apply 2 g topically 3 (three) times daily as needed (pain). 540 g 1  . gabapentin (NEURONTIN) 300 MG capsule TAKE 1 CAPSULE BY MOUTH THREE TIMES DAILY 270 capsule 1  . insulin lispro (HUMALOG KWIKPEN) 100 UNIT/ML KwikPen Inject 0.80ml (20 Units total) into the skin 3 (three) times daily as needed for high blood sugar (above 150) 15 pen 1  . isosorbide mononitrate (IMDUR) 30 MG 24 hr tablet Take one tablet by mouth once daily 90 tablet 1  . Lancets (ONETOUCH DELICA PLUS ZOXWRU04V) MISC Inject 1 Device as directed 3 (three) times daily. Dx: E11.40 300 each 3  . latanoprost (XALATAN) 0.005 % ophthalmic solution Place 1 drop into both eyes at bedtime.    . Methoxy PEG-Epoetin Beta (MIRCERA  IJ) Mircera    . multivitamin (RENA-VIT) TABS tablet Take 1 tablet by mouth daily.    . mupirocin ointment (BACTROBAN) 2 % Apply to right foot and left hallux once daily. 30 g 1  . nitroGLYCERIN (NITROSTAT) 0.3 MG SL tablet ONE TABLET UNDER TONGUE AS NEEDED FOR CHEST PAIN 25 tablet 4  . ONETOUCH ULTRA test strip USE TO TEST BLOOD SUGAR 3  TIMES DAILY 300 strip 1  . pantoprazole (PROTONIX) 40 MG tablet TAKE 1 TABLET(40 MG) BY MOUTH DAILY 90 tablet 0  . sevelamer carbonate (RENVELA) 800 MG tablet Take 800 mg by mouth 3 (three) times daily with meals.    . simethicone (MYLICON) 80 MG chewable tablet Chew 1 tablet (80 mg total) by mouth 4 (four) times daily as needed for flatulence. 90 tablet 3  . ticagrelor (BRILINTA) 90 MG TABS tablet Take 1 tablet (90 mg total) by mouth 2 (two) times daily. 180 tablet 3  . timolol (BETIMOL) 0.5 % ophthalmic solution Apply to eye.    . timolol (TIMOPTIC) 0.5 % ophthalmic solution Place 1 drop into both eyes every morning.     No current facility-administered medications on file prior to visit.     Allergies  Allergen Reactions  . Augmentin [Amoxicillin-Pot Clavulanate] Diarrhea    Severe  . Amphetamines Other (See Comments)    Unknown reaction type     Family History  Problem Relation Age of Onset  . Heart failure Father   . Hypertension Father   . Diabetes Brother   . Heart attack Brother   . Alzheimer's disease Mother   . Stroke Sister   . Diabetes Sister   . Alzheimer's disease Sister   . Hypertension Brother   . Diabetes Brother   . Drug abuse Brother   . Colon cancer Neg Hx      Social History   Occupational History  . Occupation: retired  Tobacco Use  . Smoking status: Current Every Day Smoker    Packs/day: 0.50    Years: 43.00    Pack years: 21.50    Types: Cigarettes  . Smokeless tobacco: Never Used  Vaping Use  . Vaping Use: Never used  Substance and Sexual Activity  . Alcohol use: Not Currently    Alcohol/week: 12.0  standard drinks    Types: 12 Standard drinks or equivalent per week  . Drug use: No    Types: Cocaine    Comment: hx of crack/cocaine 4yrs  ago 10/01/2019- none  . Sexual activity: Not Currently     Immunization History  Administered Date(s) Administered  . Fluad Quad(high Dose 65+) 06/14/2019  . Hepatitis B 08/28/2006, 10/02/2007, 04/01/2008  . Hepatitis B, adult 06/03/2014, 07/04/2014  . Influenza Split 07/28/2011  . Influenza Whole 08/28/2006, 09/10/2007, 09/15/2008, 08/03/2009, 07/26/2010  . Influenza, High Dose Seasonal PF 07/04/2018  . Influenza,inj,Quad PF,6+ Mos 07/04/2014, 07/06/2015, 07/12/2016, 07/11/2017  . Influenza-Unspecified 06/24/2013  . PFIZER SARS-COV-2 Vaccination 11/16/2019  . Pneumococcal Conjugate-13 08/18/2016  . Pneumococcal Polysaccharide-23 08/28/2006, 07/28/2011, 05/30/2018  . Tdap 06/14/2019  . Zoster 06/03/2014  . Zoster Recombinat (Shingrix) 07/24/2017, 01/09/2018   Objective:  Physical Exam: There were no vitals filed for this visit.   Jacob [name] is a pleasant 69 y.o. African American male in NAD. AAO x 3.  Vascular Examination: Capillary fill time to digits <3 seconds b/l lower extremities. Palpable pedal pulses b/l LE. Pedal hair absent. Lower extremity skin temperature gradient within normal limits. No pain with calf compression b/l.  Dermatological Examination: Pedal skin with normal turgor, texture and tone bilaterally. No open wounds bilaterally. No interdigital macerations bilaterally.         Wound Location: submet head 4 right foot There is a minimal amount of devitalized hyperkeratotic tissue present in the wound. Predebridement Wound Measurement: 2.5 x 2.0 cm Postdebridement Wound Measurement: Ulcer healed Wound Base:Epithelial Peri-wound: Normal Exudate: None: wound tissue dry Blood Loss during debridement: 0 cc('s). Material in wound which inhibits healing/promotes adjacent tissue breakdown: N/A Description of tissue  removed from ulceration today: hyperkeratosis Sign(s) of clinical bacterial infection: no clinical signs of infection noted on examination today.  Wound Location: L hallux There is a minimal amount of devitalized hyperkeratotic tissue present in the wound. Predebridement Wound Measurement:  2.0 x 1.5 cm. Postdebridement Wound Measurement: Ulcer healed. Wound Base: Epithelial Peri-wound: Normal Exudate: None: wound tissue dry Blood Loss during debridement: 0 cc('s). Material in wound which inhibits healing/promotes adjacent tissue breakdown: N/A Description of tissue removed from ulceration today:  hyperkeratosis Sign(s) of clinical bacterial infection: no clinical signs of infection noted on examination today.   Musculoskeletal Examination: Normal muscle strength 5/5 to all lower extremity muscle groups bilaterally. No pain crepitus or joint limitation noted with ROM b/l. Hallux valgus with bunion deformity noted b/l lower extremities. Hammertoes noted to the 2-5 bilaterally.  Neurological Examination: Protective sensation diminished with 10g monofilament b/l.  Hemoglobin A1C 02/05/2020 11/09/2019 06/04/2019  HGBA1C 4.4 5.0 9.2(H)  Some recent data might be hidden    Lab Results  Component Value Date   WBC 4.7 03/19/2020   HGB 8.2 (L) 03/19/2020   HCT 26.6 (L) 03/19/2020   MCV 100.0 03/19/2020   PLT 222 03/19/2020     Assessment:   1. Healed diabetic ulcer of foot   2. Diabetes mellitus due to underlying condition with diabetic polyneuropathy, unspecified whether long term insulin use ([place])    Plan:  -Patient was evaluated and treated and all questions answered.  -Patient/POA/Family member educated on diagnosis and treatment plan of routine ulcer debridement/wound care.  -Ulceration debridement left hallux and submet head 4 right foot achieved utilizing sharp excisional debridement with sterile scalpel blade.  Type/amount of devitalized tissue removed:  hyperkeratosis -Ulceration cleansed with wound cleanser. Triple antibiotic ointment applied to both wounds. -Wounds have resolved. -Patient risk factors affecting healing of ulcer: diabetes, diabetic neuropathy, foot deformity, ESRD on hemodialysis, recurrent ulceration -He did not get diabetic shoes/insoles from [place]. Will have Pedorthist's office reach  out to him with cost of custom diabetic insoles. Patient related understanding.  Return in about 4 weeks (around 06/09/2020) for diabetic ulcer.  Marzetta Board, DPM

## 2020-05-18 DIAGNOSIS — E1122 Type 2 diabetes mellitus with diabetic chronic kidney disease: Secondary | ICD-10-CM | POA: Diagnosis not present

## 2020-05-18 DIAGNOSIS — N2581 Secondary hyperparathyroidism of renal origin: Secondary | ICD-10-CM | POA: Diagnosis not present

## 2020-05-18 DIAGNOSIS — D509 Iron deficiency anemia, unspecified: Secondary | ICD-10-CM | POA: Diagnosis not present

## 2020-05-18 DIAGNOSIS — Z992 Dependence on renal dialysis: Secondary | ICD-10-CM | POA: Diagnosis not present

## 2020-05-18 DIAGNOSIS — N186 End stage renal disease: Secondary | ICD-10-CM | POA: Diagnosis not present

## 2020-05-18 DIAGNOSIS — D631 Anemia in chronic kidney disease: Secondary | ICD-10-CM | POA: Diagnosis not present

## 2020-05-19 ENCOUNTER — Other Ambulatory Visit: Payer: Self-pay

## 2020-05-19 ENCOUNTER — Ambulatory Visit: Payer: Medicare Other

## 2020-05-20 DIAGNOSIS — Z992 Dependence on renal dialysis: Secondary | ICD-10-CM | POA: Diagnosis not present

## 2020-05-20 DIAGNOSIS — N2581 Secondary hyperparathyroidism of renal origin: Secondary | ICD-10-CM | POA: Diagnosis not present

## 2020-05-20 DIAGNOSIS — D509 Iron deficiency anemia, unspecified: Secondary | ICD-10-CM | POA: Diagnosis not present

## 2020-05-20 DIAGNOSIS — D631 Anemia in chronic kidney disease: Secondary | ICD-10-CM | POA: Diagnosis not present

## 2020-05-20 DIAGNOSIS — N186 End stage renal disease: Secondary | ICD-10-CM | POA: Diagnosis not present

## 2020-05-20 DIAGNOSIS — E1122 Type 2 diabetes mellitus with diabetic chronic kidney disease: Secondary | ICD-10-CM | POA: Diagnosis not present

## 2020-05-21 ENCOUNTER — Encounter: Payer: Self-pay | Admitting: Internal Medicine

## 2020-05-22 DIAGNOSIS — N2581 Secondary hyperparathyroidism of renal origin: Secondary | ICD-10-CM | POA: Diagnosis not present

## 2020-05-22 DIAGNOSIS — D631 Anemia in chronic kidney disease: Secondary | ICD-10-CM | POA: Diagnosis not present

## 2020-05-22 DIAGNOSIS — Z992 Dependence on renal dialysis: Secondary | ICD-10-CM | POA: Diagnosis not present

## 2020-05-22 DIAGNOSIS — E1122 Type 2 diabetes mellitus with diabetic chronic kidney disease: Secondary | ICD-10-CM | POA: Diagnosis not present

## 2020-05-22 DIAGNOSIS — N186 End stage renal disease: Secondary | ICD-10-CM | POA: Diagnosis not present

## 2020-05-22 DIAGNOSIS — D509 Iron deficiency anemia, unspecified: Secondary | ICD-10-CM | POA: Diagnosis not present

## 2020-05-23 DIAGNOSIS — N186 End stage renal disease: Secondary | ICD-10-CM | POA: Diagnosis not present

## 2020-05-23 DIAGNOSIS — I129 Hypertensive chronic kidney disease with stage 1 through stage 4 chronic kidney disease, or unspecified chronic kidney disease: Secondary | ICD-10-CM | POA: Diagnosis not present

## 2020-05-23 DIAGNOSIS — Z992 Dependence on renal dialysis: Secondary | ICD-10-CM | POA: Diagnosis not present

## 2020-05-25 DIAGNOSIS — E162 Hypoglycemia, unspecified: Secondary | ICD-10-CM | POA: Diagnosis not present

## 2020-05-25 DIAGNOSIS — D631 Anemia in chronic kidney disease: Secondary | ICD-10-CM | POA: Diagnosis not present

## 2020-05-25 DIAGNOSIS — N2581 Secondary hyperparathyroidism of renal origin: Secondary | ICD-10-CM | POA: Diagnosis not present

## 2020-05-25 DIAGNOSIS — N186 End stage renal disease: Secondary | ICD-10-CM | POA: Diagnosis not present

## 2020-05-25 DIAGNOSIS — E1122 Type 2 diabetes mellitus with diabetic chronic kidney disease: Secondary | ICD-10-CM | POA: Diagnosis not present

## 2020-05-25 DIAGNOSIS — D509 Iron deficiency anemia, unspecified: Secondary | ICD-10-CM | POA: Diagnosis not present

## 2020-05-25 DIAGNOSIS — Z992 Dependence on renal dialysis: Secondary | ICD-10-CM | POA: Diagnosis not present

## 2020-05-26 ENCOUNTER — Other Ambulatory Visit: Payer: Self-pay | Admitting: Nurse Practitioner

## 2020-05-29 DIAGNOSIS — E162 Hypoglycemia, unspecified: Secondary | ICD-10-CM | POA: Diagnosis not present

## 2020-05-29 DIAGNOSIS — D631 Anemia in chronic kidney disease: Secondary | ICD-10-CM | POA: Diagnosis not present

## 2020-05-29 DIAGNOSIS — N186 End stage renal disease: Secondary | ICD-10-CM | POA: Diagnosis not present

## 2020-05-29 DIAGNOSIS — N2581 Secondary hyperparathyroidism of renal origin: Secondary | ICD-10-CM | POA: Diagnosis not present

## 2020-05-29 DIAGNOSIS — D509 Iron deficiency anemia, unspecified: Secondary | ICD-10-CM | POA: Diagnosis not present

## 2020-05-29 DIAGNOSIS — E1122 Type 2 diabetes mellitus with diabetic chronic kidney disease: Secondary | ICD-10-CM | POA: Diagnosis not present

## 2020-05-29 DIAGNOSIS — Z992 Dependence on renal dialysis: Secondary | ICD-10-CM | POA: Diagnosis not present

## 2020-06-01 DIAGNOSIS — N2581 Secondary hyperparathyroidism of renal origin: Secondary | ICD-10-CM | POA: Diagnosis not present

## 2020-06-01 DIAGNOSIS — D509 Iron deficiency anemia, unspecified: Secondary | ICD-10-CM | POA: Diagnosis not present

## 2020-06-01 DIAGNOSIS — Z992 Dependence on renal dialysis: Secondary | ICD-10-CM | POA: Diagnosis not present

## 2020-06-01 DIAGNOSIS — E1122 Type 2 diabetes mellitus with diabetic chronic kidney disease: Secondary | ICD-10-CM | POA: Diagnosis not present

## 2020-06-01 DIAGNOSIS — E162 Hypoglycemia, unspecified: Secondary | ICD-10-CM | POA: Diagnosis not present

## 2020-06-01 DIAGNOSIS — N186 End stage renal disease: Secondary | ICD-10-CM | POA: Diagnosis not present

## 2020-06-01 DIAGNOSIS — D631 Anemia in chronic kidney disease: Secondary | ICD-10-CM | POA: Diagnosis not present

## 2020-06-03 DIAGNOSIS — E162 Hypoglycemia, unspecified: Secondary | ICD-10-CM | POA: Diagnosis not present

## 2020-06-03 DIAGNOSIS — N2581 Secondary hyperparathyroidism of renal origin: Secondary | ICD-10-CM | POA: Diagnosis not present

## 2020-06-03 DIAGNOSIS — E1122 Type 2 diabetes mellitus with diabetic chronic kidney disease: Secondary | ICD-10-CM | POA: Diagnosis not present

## 2020-06-03 DIAGNOSIS — D509 Iron deficiency anemia, unspecified: Secondary | ICD-10-CM | POA: Diagnosis not present

## 2020-06-03 DIAGNOSIS — D631 Anemia in chronic kidney disease: Secondary | ICD-10-CM | POA: Diagnosis not present

## 2020-06-03 DIAGNOSIS — Z992 Dependence on renal dialysis: Secondary | ICD-10-CM | POA: Diagnosis not present

## 2020-06-03 DIAGNOSIS — N186 End stage renal disease: Secondary | ICD-10-CM | POA: Diagnosis not present

## 2020-06-05 DIAGNOSIS — D631 Anemia in chronic kidney disease: Secondary | ICD-10-CM | POA: Diagnosis not present

## 2020-06-05 DIAGNOSIS — E162 Hypoglycemia, unspecified: Secondary | ICD-10-CM | POA: Diagnosis not present

## 2020-06-05 DIAGNOSIS — N2581 Secondary hyperparathyroidism of renal origin: Secondary | ICD-10-CM | POA: Diagnosis not present

## 2020-06-05 DIAGNOSIS — Z992 Dependence on renal dialysis: Secondary | ICD-10-CM | POA: Diagnosis not present

## 2020-06-05 DIAGNOSIS — D509 Iron deficiency anemia, unspecified: Secondary | ICD-10-CM | POA: Diagnosis not present

## 2020-06-05 DIAGNOSIS — N186 End stage renal disease: Secondary | ICD-10-CM | POA: Diagnosis not present

## 2020-06-05 DIAGNOSIS — E1122 Type 2 diabetes mellitus with diabetic chronic kidney disease: Secondary | ICD-10-CM | POA: Diagnosis not present

## 2020-06-08 DIAGNOSIS — N186 End stage renal disease: Secondary | ICD-10-CM | POA: Diagnosis not present

## 2020-06-08 DIAGNOSIS — D509 Iron deficiency anemia, unspecified: Secondary | ICD-10-CM | POA: Diagnosis not present

## 2020-06-08 DIAGNOSIS — N2581 Secondary hyperparathyroidism of renal origin: Secondary | ICD-10-CM | POA: Diagnosis not present

## 2020-06-08 DIAGNOSIS — D631 Anemia in chronic kidney disease: Secondary | ICD-10-CM | POA: Diagnosis not present

## 2020-06-08 DIAGNOSIS — E162 Hypoglycemia, unspecified: Secondary | ICD-10-CM | POA: Diagnosis not present

## 2020-06-08 DIAGNOSIS — Z992 Dependence on renal dialysis: Secondary | ICD-10-CM | POA: Diagnosis not present

## 2020-06-08 DIAGNOSIS — E1122 Type 2 diabetes mellitus with diabetic chronic kidney disease: Secondary | ICD-10-CM | POA: Diagnosis not present

## 2020-06-09 ENCOUNTER — Encounter: Payer: Self-pay | Admitting: Podiatry

## 2020-06-09 ENCOUNTER — Ambulatory Visit (INDEPENDENT_AMBULATORY_CARE_PROVIDER_SITE_OTHER): Payer: Medicare Other | Admitting: Podiatry

## 2020-06-09 ENCOUNTER — Other Ambulatory Visit: Payer: Self-pay

## 2020-06-09 DIAGNOSIS — Z992 Dependence on renal dialysis: Secondary | ICD-10-CM

## 2020-06-09 DIAGNOSIS — Z8631 Personal history of diabetic foot ulcer: Secondary | ICD-10-CM | POA: Diagnosis not present

## 2020-06-09 DIAGNOSIS — E0842 Diabetes mellitus due to underlying condition with diabetic polyneuropathy: Secondary | ICD-10-CM

## 2020-06-09 DIAGNOSIS — N186 End stage renal disease: Secondary | ICD-10-CM | POA: Diagnosis not present

## 2020-06-10 DIAGNOSIS — D631 Anemia in chronic kidney disease: Secondary | ICD-10-CM | POA: Diagnosis not present

## 2020-06-10 DIAGNOSIS — N2581 Secondary hyperparathyroidism of renal origin: Secondary | ICD-10-CM | POA: Diagnosis not present

## 2020-06-10 DIAGNOSIS — Z992 Dependence on renal dialysis: Secondary | ICD-10-CM | POA: Diagnosis not present

## 2020-06-10 DIAGNOSIS — E162 Hypoglycemia, unspecified: Secondary | ICD-10-CM | POA: Diagnosis not present

## 2020-06-10 DIAGNOSIS — N186 End stage renal disease: Secondary | ICD-10-CM | POA: Diagnosis not present

## 2020-06-10 DIAGNOSIS — E1122 Type 2 diabetes mellitus with diabetic chronic kidney disease: Secondary | ICD-10-CM | POA: Diagnosis not present

## 2020-06-10 DIAGNOSIS — D509 Iron deficiency anemia, unspecified: Secondary | ICD-10-CM | POA: Diagnosis not present

## 2020-06-11 NOTE — Progress Notes (Signed)
Subjective:  [name], male    DOB: September 17, 1951,  MRN: [id number]  69 y.o. male is seen for follow up with h/o ulcer L hallux and submet head 4 right foot.  Patient has been wearing Croc type shoes.   Patient relates left foot continues to look good.  The patient is having no constitutional symptoms, denying fever, chills, anorexia, or weight loss.  He voices no new pedal problems on today's visit.  Past Medical History:  Diagnosis Date  . Acute respiratory failure ([place]) 03/01/2018  . Arthritis    "all over; mostly knees and back" (02/28/2018)  . CHF (congestive heart failure) (HCC)    not on any meds  . Chronic lower back pain    stenosis  . Community acquired pneumonia 09/06/2013  . COPD (chronic obstructive pulmonary disease) ([place])   . Coronary atherosclerosis of native coronary artery 2005   s/p surgery  . Drug abuse ([place])    hx; tested for cocaine as recently as 2/08. says he is not using drugs now - avoided defib. for this reason   . ESRD (end stage renal disease) ([place])    Hemo M-W-F- Richarda Blade  . GERD (gastroesophageal reflux disease)    takes OTC meds as needed  . GI bleeding 02/06/2019  . Glaucoma    uses eye drops daily  . Hepatitis B 1968   "tx'd w/isolation; caught it from toilet stools in gym"  . History of blood transfusion 03/01/2019  . History of colon polyps    benign  . History of gout    takes Allopurinol daily as well as Colchicine-if needed (02/28/2018)  . History of kidney stones   . HTN (hypertension)    takes Coreg,Imdur.and Apresoline daily  . Human immunodeficiency virus (HIV) disease ([place]) dx'd 1995   takes Genvoya daily  . Hyperlipidemia    takes Atorvastatin daily  . Ischemic cardiomyopathy   . Muscle spasm    takes Zanaflex as needed  . Myocardial infarction ([place]) ~ 2004/2005  . Nocturia   . Peripheral neuropathy    takes gabapentin daily  . Pneumonia    "at least twice" (02/28/2018)  . Shortness of breath dyspnea     rarely but if notices it then with exertion  . Syphilis, unspecified   . Type II diabetes mellitus ([place]) 2004   Lantus daily.Average fasting blood sugar 125-199  . Wears glasses   . Wears partial dentures      Past Surgical History:  Procedure Laterality Date  . AV FISTULA PLACEMENT Left 08/02/2018   Procedure: ARTERIOVENOUS (AV) FISTULA CREATION  left arm radiocephlic;  Surgeon: Marty Heck, MD;  Location: [place];  Service: Vascular;  Laterality: Left;  . AV FISTULA PLACEMENT Left 08/01/2019   Procedure: LEFT BRACHIOCEPHALIC ARTERIOVENOUS (AV) FISTULA CREATION;  Surgeon: [name], MD;  Location: [place];  Service: Vascular;  Laterality: Left;  . BASCILIC VEIN TRANSPOSITION Left 10/03/2019   Procedure: BASILIC VEIN TRANSPOSITION LEFT SECOND STAGE;  Surgeon: [name], MD;  Location: [place];  Service: Vascular;  Laterality: Left;  . CARDIAC CATHETERIZATION  10/2002; 12/19/2004   [name] 03/08/2011  . COLONOSCOPY  2013   Dr.John Henrene Pastor   . CORONARY ARTERY BYPASS GRAFT  02/24/2003   CABG X2/notes 03/08/2011  . CORONARY STENT INTERVENTION N/A 12/19/2019   Procedure: CORONARY STENT INTERVENTION;  Surgeon: Jettie Booze, MD;  Location: [place] [facility];  Service: Cardiovascular;  Laterality: N/A;  . ESOPHAGOGASTRODUODENOSCOPY (EGD) WITH  PROPOFOL N/A 02/08/2019   Procedure: ESOPHAGOGASTRODUODENOSCOPY (EGD) WITH PROPOFOL;  Surgeon: Milus Banister, MD;  Location: [place];  Service: Gastroenterology;  Laterality: N/A;  . ESOPHAGOGASTRODUODENOSCOPY (EGD) WITH PROPOFOL N/A 12/22/2019   Procedure: ESOPHAGOGASTRODUODENOSCOPY (EGD) WITH PROPOFOL;  Surgeon: Lavena Bullion, DO;  Location: [place];  Service: Gastroenterology;  Laterality: N/A;  . HEMOSTASIS CLIP PLACEMENT  12/22/2019   Procedure: HEMOSTASIS CLIP PLACEMENT;  Surgeon: Lavena Bullion, DO;  Location: [place];  Service: Gastroenterology;;  . HOT HEMOSTASIS N/A 02/08/2019   Procedure: HOT HEMOSTASIS (ARGON  PLASMA COAGULATION/BICAP);  Surgeon: Milus Banister, MD;  [facility];  Service: Gastroenterology;  Laterality: N/A;  . HOT HEMOSTASIS N/A 12/22/2019   Procedure: HOT HEMOSTASIS (ARGON PLASMA COAGULATION/BICAP);  Surgeon: Lavena Bullion, DO;  Location: [facility] ENDOSCOPY;  Service: Gastroenterology;  Laterality: N/A;  . INTERTROCHANTERIC HIP FRACTURE SURGERY Left 11/2006   [name] 03/08/2011  . INTRAVASCULAR ULTRASOUND/IVUS N/A 12/19/2019   Procedure: Intravascular Ultrasound/IVUS;  Surgeon: Jettie Booze, MD;  Location: [place] [facility];  Service: Cardiovascular;  Laterality: N/A;  . IR FLUORO GUIDE CV LINE RIGHT  07/24/2019  . IR FLUORO GUIDE CV LINE RIGHT  07/30/2019  . IR US GUIDE VASC ACCESS RIGHT  07/24/2019  . IR US GUIDE VASC ACCESS RIGHT  07/30/2019  . LAPAROSCOPIC CHOLECYSTECTOMY  05/2006  . LIGATION OF COMPETING BRANCHES OF ARTERIOVENOUS FISTULA Left 11/05/2018   Procedure: LIGATION OF COMPETING BRANCHES OF ARTERIOVENOUS FISTULA  LEFT  ARM;  Surgeon: Marty Heck, MD;  Location: [place];  Service: Vascular;  Laterality: Left;  . LUMBAR LAMINECTOMY/DECOMPRESSION MICRODISCECTOMY N/A 02/29/2016   Procedure: Left L4-5 Lateral Recess Decompression, Removal Extradural Intraspinal Facet Cyst;  Surgeon: Marybelle Killings, MD;  Location: [place];  Service: Orthopedics;  Laterality: N/A;  . MULTIPLE TOOTH EXTRACTIONS    . ORIF MANDIBULAR FRACTURE Left 08/13/2004   ORIF of left body fracture mandible with KLS Martin 2.3-mm six hole/notes 03/08/2011  . RIGHT/LEFT HEART CATH AND CORONARY/GRAFT ANGIOGRAPHY N/A 12/19/2019   Procedure: RIGHT/LEFT HEART CATH AND CORONARY/GRAFT ANGIOGRAPHY;  Surgeon: Jettie Booze, MD;  Location: [place] [facility];  Service: Cardiovascular;  Laterality: N/A;  . SCLEROTHERAPY  12/22/2019   Procedure: SCLEROTHERAPY;  Surgeon: Lavena Bullion, DO;  Location: [facility] ENDOSCOPY;  Service: Gastroenterology;;     Current Outpatient Medications on File Prior to  Visit  Medication Sig Dispense Refill  . dextrose 50 % solution Dextrose 50%    . acetaminophen (TYLENOL) 500 MG tablet Take 1,000 mg by mouth every 6 (six) hours as needed for moderate pain.     [name] allopurinol (ZYLOPRIM) 100 MG tablet TAKE 1 TABLET(100 MG) BY MOUTH DAILY 90 tablet 1  . ANORO ELLIPTA 62.5-25 MCG/INH AEPB INHALE 1 PUFF BY MOUTH EVERY DAY 60 each 5  . atorvastatin (LIPITOR) 80 MG tablet TAKE 1 TABLET(80 MG) BY MOUTH DAILY 90 tablet 3  . B-D UF III MINI PEN NEEDLES 31G X 5 MM MISC USE FOUR TIMES DAILY 100 each 11  . BIKTARVY 50-200-25 MG TABS tablet TAKE 1 TABLET BY MOUTH DAILY 30 tablet 5  . cadexomer iodine (IODOSORB) 0.9 % gel Apply 1 application topically daily as needed for wound care (Apply to fifth right toe and first left toe daily). 40 g 0  . carvedilol (COREG) 3.125 MG tablet Take 1 tablet (3.125 mg total) by mouth 2 (two) times daily. 180 tablet 3  . Choline Fenofibrate (FENOFIBRIC ACID) 135 MG CPDR TAKE 1 CAPSULE  BY MOUTH DAILY 90 capsule 1  . colchicine 0.6 MG tablet Take 0.6 mg by mouth daily as needed (pain).    [name] diclofenac Sodium (VOLTAREN) 1 % GEL Apply 2 g topically 3 (three) times daily as needed (pain). 540 g 1  . gabapentin (NEURONTIN) 300 MG capsule TAKE 1 CAPSULE BY MOUTH THREE TIMES DAILY 270 capsule 1  . insulin lispro (HUMALOG KWIKPEN) 100 UNIT/ML KwikPen Inject 0.12ml (20 Units total) into the skin 3 (three) times daily as needed for high blood sugar (above 150) 15 pen 1  . isosorbide mononitrate (IMDUR) 30 MG 24 hr tablet Take one tablet by mouth once daily 90 tablet 1  . Lancets (ONETOUCH DELICA PLUS [id number]) MISC Inject 1 Device as directed 3 (three) times daily. Dx: E11.40 300 each 3  . latanoprost (XALATAN) 0.005 % ophthalmic solution Place 1 drop into both eyes at bedtime.    . lidocaine-prilocaine (EMLA) cream SMARTSIG:Sparingly Topical    . Methoxy PEG-Epoetin Beta (MIRCERA IJ) Mircera    . multivitamin (RENA-VIT) TABS tablet Take 1 tablet by  mouth daily.    . mupirocin ointment (BACTROBAN) 2 % Apply to right foot and left hallux once daily. 30 g 1  . nitroGLYCERIN (NITROSTAT) 0.3 MG SL tablet ONE TABLET UNDER TONGUE AS NEEDED FOR CHEST PAIN 25 tablet 4  . ONETOUCH ULTRA test strip CHECK BLOOD SUGAR 3 TIMES  DAILY 300 strip 3  . pantoprazole (PROTONIX) 40 MG tablet TAKE 1 TABLET(40 MG) BY MOUTH DAILY 90 tablet 0  . sevelamer carbonate (RENVELA) 800 MG tablet Take 800 mg by mouth 3 (three) times daily with meals.    . simethicone (MYLICON) 80 MG chewable tablet Chew 1 tablet (80 mg total) by mouth 4 (four) times daily as needed for flatulence. 90 tablet 3  . ticagrelor (BRILINTA) 90 MG TABS tablet Take 1 tablet (90 mg total) by mouth 2 (two) times daily. 180 tablet 3  . timolol (BETIMOL) 0.5 % ophthalmic solution Apply to eye.    . timolol (TIMOPTIC) 0.5 % ophthalmic solution Place 1 drop into both eyes every morning.     No current facility-administered medications on file prior to visit.     Allergies  Allergen Reactions  . Augmentin [Amoxicillin-Pot Clavulanate] Diarrhea    Severe  . Amphetamines Other (See Comments)    Unknown reaction type     Family History  Problem Relation Age of Onset  . Heart failure Father   . Hypertension Father   . Diabetes Brother   . Heart attack Brother   . Alzheimer's disease Mother   . Stroke Sister   . Diabetes Sister   . Alzheimer's disease Sister   . Hypertension Brother   . Diabetes Brother   . Drug abuse Brother   . Colon cancer Neg Hx      Social History   Occupational History  . Occupation: retired  Tobacco Use  . Smoking status: Current Every Day Smoker    Packs/day: 0.50    Years: 43.00    Pack years: 21.50    Types: Cigarettes  . Smokeless tobacco: Never Used  Vaping Use  . Vaping Use: Never used  Substance and Sexual Activity  . Alcohol use: Not Currently    Alcohol/week: 12.0 standard drinks    Types: 12 Standard drinks or equivalent per week  . Drug use:  No    Types: Cocaine    Comment: hx of crack/cocaine 69yrs ago 10/01/2019- none  . Sexual activity: Not  Currently     Immunization History  Administered Date(s) Administered  . Fluad Quad(high Dose 65+) 06/14/2019  . Hepatitis B 08/28/2006, 10/02/2007, 04/01/2008  . Hepatitis B, adult 06/03/2014, 07/04/2014  . Influenza Split 07/28/2011  . Influenza Whole 08/28/2006, 09/10/2007, 09/15/2008, 08/03/2009, 07/26/2010  . Influenza, High Dose Seasonal PF 07/04/2018  . Influenza,inj,Quad PF,6+ Mos 07/04/2014, 07/06/2015, 07/12/2016, 07/11/2017  . Influenza-Unspecified 06/24/2013  . PFIZER SARS-COV-2 Vaccination 11/16/2019  . Pneumococcal Conjugate-13 08/18/2016  . Pneumococcal Polysaccharide-23 08/28/2006, 07/28/2011, 05/30/2018  . Tdap 06/14/2019  . Zoster 06/03/2014  . Zoster Recombinat (Shingrix) 07/24/2017, 01/09/2018   Objective:  Physical Exam: There were no vitals filed for this visit.   BENJIMAN SEDGWICK is a pleasant 69 y.o. African American male in NAD. AAO x 3.  Vascular Examination: Capillary fill time to digits <3 seconds b/l lower extremities. Palpable pedal pulses b/l LE. Pedal hair absent. Lower extremity skin temperature gradient within normal limits. No pain with calf compression b/l.  Dermatological Examination: Pedal skin with normal turgor, texture and tone bilaterally. No open wounds bilaterally. No interdigital macerations bilaterally.   Left hallux has no subdermal hemorrhage. No erythema, no edema, no drainage, no fluctuance.  Right submet head 4 lesion has no subdermal hemorrhage. No erythema, no edema, no drainage, no fluctuance.  Musculoskeletal Examination: Normal muscle strength 5/5 to all lower extremity muscle groups bilaterally. No pain crepitus or joint limitation noted with ROM b/l. Hallux valgus with bunion deformity noted b/l lower extremities. Hammertoes noted to the 2-5 bilaterally.  Neurological Examination: Protective sensation diminished with  10g monofilament b/l.  Hemoglobin A1C 02/05/2020 11/09/2019  HGBA1C 4.4 5.0  Some recent data might be hidden    Lab Results  Component Value Date   WBC 4.7 03/19/2020   HGB 8.2 (L) 03/19/2020   HCT 26.6 (L) 03/19/2020   MCV 100.0 03/19/2020   PLT 222 03/19/2020     Assessment:   1. Healed diabetic ulcer of foot   2. ESRD on hemodialysis ([place])   3. Diabetes mellitus due to underlying condition with diabetic polyneuropathy, unspecified whether long term insulin use ([place])    Plan:  -Patient was evaluated and treated and all questions answered.  -Today, wounds remained closed. -Patient risk factors affecting recurrence of wound: diabetes, diabetic neuropathy, foot deformity, ESRD on hemodialysis, recurrent ulceration, need for offloading of lesions -He did not get diabetic shoes/insoles from [place]. He needs to bring his diabetic shoes in on next visit. I have asked him to see Pedorthist on next visit in 4 weeks. He needs offloading of these lesions to prevent recurrence of ulcerations. He related understanding.  Return in about 4 weeks (around 07/07/2020) for diabetic nail and callus trim, at risk foot care.  Marzetta Board, DPM

## 2020-06-12 DIAGNOSIS — E162 Hypoglycemia, unspecified: Secondary | ICD-10-CM | POA: Diagnosis not present

## 2020-06-12 DIAGNOSIS — E1122 Type 2 diabetes mellitus with diabetic chronic kidney disease: Secondary | ICD-10-CM | POA: Diagnosis not present

## 2020-06-12 DIAGNOSIS — D509 Iron deficiency anemia, unspecified: Secondary | ICD-10-CM | POA: Diagnosis not present

## 2020-06-12 DIAGNOSIS — N186 End stage renal disease: Secondary | ICD-10-CM | POA: Diagnosis not present

## 2020-06-12 DIAGNOSIS — Z992 Dependence on renal dialysis: Secondary | ICD-10-CM | POA: Diagnosis not present

## 2020-06-12 DIAGNOSIS — N2581 Secondary hyperparathyroidism of renal origin: Secondary | ICD-10-CM | POA: Diagnosis not present

## 2020-06-12 DIAGNOSIS — D631 Anemia in chronic kidney disease: Secondary | ICD-10-CM | POA: Diagnosis not present

## 2020-06-15 DIAGNOSIS — E1122 Type 2 diabetes mellitus with diabetic chronic kidney disease: Secondary | ICD-10-CM | POA: Diagnosis not present

## 2020-06-15 DIAGNOSIS — D631 Anemia in chronic kidney disease: Secondary | ICD-10-CM | POA: Diagnosis not present

## 2020-06-15 DIAGNOSIS — E162 Hypoglycemia, unspecified: Secondary | ICD-10-CM | POA: Diagnosis not present

## 2020-06-15 DIAGNOSIS — Z992 Dependence on renal dialysis: Secondary | ICD-10-CM | POA: Diagnosis not present

## 2020-06-15 DIAGNOSIS — N186 End stage renal disease: Secondary | ICD-10-CM | POA: Diagnosis not present

## 2020-06-15 DIAGNOSIS — N2581 Secondary hyperparathyroidism of renal origin: Secondary | ICD-10-CM | POA: Diagnosis not present

## 2020-06-15 DIAGNOSIS — D509 Iron deficiency anemia, unspecified: Secondary | ICD-10-CM | POA: Diagnosis not present

## 2020-06-16 ENCOUNTER — Ambulatory Visit (INDEPENDENT_AMBULATORY_CARE_PROVIDER_SITE_OTHER): Payer: Medicare Other | Admitting: Internal Medicine

## 2020-06-16 ENCOUNTER — Encounter: Payer: Self-pay | Admitting: Internal Medicine

## 2020-06-16 VITALS — BP 128/60 | HR 72 | Ht 74.0 in | Wt 176.0 lb

## 2020-06-16 DIAGNOSIS — D649 Anemia, unspecified: Secondary | ICD-10-CM | POA: Diagnosis not present

## 2020-06-16 DIAGNOSIS — D500 Iron deficiency anemia secondary to blood loss (chronic): Secondary | ICD-10-CM | POA: Diagnosis not present

## 2020-06-16 DIAGNOSIS — K922 Gastrointestinal hemorrhage, unspecified: Secondary | ICD-10-CM | POA: Diagnosis not present

## 2020-06-16 DIAGNOSIS — K5900 Constipation, unspecified: Secondary | ICD-10-CM | POA: Diagnosis not present

## 2020-06-16 DIAGNOSIS — Q2730 Arteriovenous malformation, site unspecified: Secondary | ICD-10-CM

## 2020-06-16 NOTE — Patient Instructions (Signed)
Please follow up as needed 

## 2020-06-17 DIAGNOSIS — N2581 Secondary hyperparathyroidism of renal origin: Secondary | ICD-10-CM | POA: Diagnosis not present

## 2020-06-17 DIAGNOSIS — E1122 Type 2 diabetes mellitus with diabetic chronic kidney disease: Secondary | ICD-10-CM | POA: Diagnosis not present

## 2020-06-17 DIAGNOSIS — D631 Anemia in chronic kidney disease: Secondary | ICD-10-CM | POA: Diagnosis not present

## 2020-06-17 DIAGNOSIS — E162 Hypoglycemia, unspecified: Secondary | ICD-10-CM | POA: Diagnosis not present

## 2020-06-17 DIAGNOSIS — D509 Iron deficiency anemia, unspecified: Secondary | ICD-10-CM | POA: Diagnosis not present

## 2020-06-17 DIAGNOSIS — Z992 Dependence on renal dialysis: Secondary | ICD-10-CM | POA: Diagnosis not present

## 2020-06-17 DIAGNOSIS — N186 End stage renal disease: Secondary | ICD-10-CM | POA: Diagnosis not present

## 2020-06-19 DIAGNOSIS — Z992 Dependence on renal dialysis: Secondary | ICD-10-CM | POA: Diagnosis not present

## 2020-06-19 DIAGNOSIS — D631 Anemia in chronic kidney disease: Secondary | ICD-10-CM | POA: Diagnosis not present

## 2020-06-19 DIAGNOSIS — E162 Hypoglycemia, unspecified: Secondary | ICD-10-CM | POA: Diagnosis not present

## 2020-06-19 DIAGNOSIS — N186 End stage renal disease: Secondary | ICD-10-CM | POA: Diagnosis not present

## 2020-06-19 DIAGNOSIS — N2581 Secondary hyperparathyroidism of renal origin: Secondary | ICD-10-CM | POA: Diagnosis not present

## 2020-06-19 DIAGNOSIS — E1122 Type 2 diabetes mellitus with diabetic chronic kidney disease: Secondary | ICD-10-CM | POA: Diagnosis not present

## 2020-06-19 DIAGNOSIS — D509 Iron deficiency anemia, unspecified: Secondary | ICD-10-CM | POA: Diagnosis not present

## 2020-06-22 DIAGNOSIS — E1122 Type 2 diabetes mellitus with diabetic chronic kidney disease: Secondary | ICD-10-CM | POA: Diagnosis not present

## 2020-06-22 DIAGNOSIS — D509 Iron deficiency anemia, unspecified: Secondary | ICD-10-CM | POA: Diagnosis not present

## 2020-06-22 DIAGNOSIS — E162 Hypoglycemia, unspecified: Secondary | ICD-10-CM | POA: Diagnosis not present

## 2020-06-22 DIAGNOSIS — N186 End stage renal disease: Secondary | ICD-10-CM | POA: Diagnosis not present

## 2020-06-22 DIAGNOSIS — D631 Anemia in chronic kidney disease: Secondary | ICD-10-CM | POA: Diagnosis not present

## 2020-06-22 DIAGNOSIS — N2581 Secondary hyperparathyroidism of renal origin: Secondary | ICD-10-CM | POA: Diagnosis not present

## 2020-06-22 DIAGNOSIS — Z992 Dependence on renal dialysis: Secondary | ICD-10-CM | POA: Diagnosis not present

## 2020-06-23 DIAGNOSIS — I129 Hypertensive chronic kidney disease with stage 1 through stage 4 chronic kidney disease, or unspecified chronic kidney disease: Secondary | ICD-10-CM | POA: Diagnosis not present

## 2020-06-23 DIAGNOSIS — N186 End stage renal disease: Secondary | ICD-10-CM | POA: Diagnosis not present

## 2020-06-23 DIAGNOSIS — Z992 Dependence on renal dialysis: Secondary | ICD-10-CM | POA: Diagnosis not present

## 2020-06-23 NOTE — Progress Notes (Signed)
HISTORY OF PRESENT ILLNESS:  Jacob [name] is a 69 y.o. male with MULTIPLE SIGNIFICANT medical problems including, but not limited to, end-stage renal disease on hemodialysis, insulin requiring diabetes mellitus, history of drug abuse HIV disease on therapy, coronary artery disease on Brilinta, GERD on PPI, hypertension, gout, COPD, cardiomyopathy with a history of CHF, and prior CABG.  He is also had several AV fistula graft revisions in his left arm.  He is noted to have chronic anemia with baseline over the past year of approximately 8.  He is followed by hematology.  He also has a history of a cold and overt GI bleeding secondary to upper GI AVMs.  For this he underwent upper endoscopy and enteroscopy with ablation therapy April 2020 and again February 2021.  Active bleeding during the most recent examination.  Patient has had prior colonoscopy October 01, 2012.  He was noted to have diverticulosis.  No neoplasia.  He was noted to be anemic during dialysis with a hemoglobin of 7.0.  He was sent to the emergency room and provided transfusion.  He tells me that his most recent hemoglobin was 10.5.  Patient was last seen in this office by myself July 2020 and the GI physician assistant March 2021.  Patient reports to me that he is feeling fairly well at this time.  He continues with dialysis.  No overt bleeding.  He does take iron.  Stools are dark times daily denies would be frank melena.  He was told that he might need colonoscopy, but was not sure.  REVIEW OF SYSTEMS:  All non-GI ROS negative unless otherwise stated in the HPI except for arthritis  Past Medical History:  Diagnosis Date  . Acute respiratory failure ([place]) 03/01/2018  . Arthritis    "all over; mostly knees and back" (02/28/2018)  . CHF (congestive heart failure) (HCC)    not on any meds  . Chronic lower back pain    stenosis  . Community acquired pneumonia 09/06/2013  . COPD (chronic obstructive pulmonary disease) ([place])   .  Coronary atherosclerosis of native coronary artery 2005   s/p surgery  . Drug abuse ([place])    hx; tested for cocaine as recently as 2/08. says he is not using drugs now - avoided defib. for this reason   . ESRD (end stage renal disease) ([place])    Hemo M-W-F- Richarda Blade  . GERD (gastroesophageal reflux disease)    takes OTC meds as needed  . GI bleeding 02/06/2019  . Glaucoma    uses eye drops daily  . Hepatitis B 1968   "tx'd w/isolation; caught it from toilet stools in gym"  . History of blood transfusion 03/01/2019  . History of colon polyps    benign  . History of gout    takes Allopurinol daily as well as Colchicine-if needed (02/28/2018)  . History of kidney stones   . HTN (hypertension)    takes Coreg,Imdur.and Apresoline daily  . Human immunodeficiency virus (HIV) disease ([place]) dx'd 1995   takes Genvoya daily  . Hyperlipidemia    takes Atorvastatin daily  . Ischemic cardiomyopathy   . Muscle spasm    takes Zanaflex as needed  . Myocardial infarction ([place]) ~ 2004/2005  . Nocturia   . Peripheral neuropathy    takes gabapentin daily  . Pneumonia    "at least twice" (02/28/2018)  . Shortness of breath dyspnea    rarely but if notices it then with exertion  . Syphilis, unspecified   .  Type II diabetes mellitus ([place]) 2004   Lantus daily.Average fasting blood sugar 125-199  . Wears glasses   . Wears partial dentures     Past Surgical History:  Procedure Laterality Date  . AV FISTULA PLACEMENT Left 08/02/2018   Procedure: ARTERIOVENOUS (AV) FISTULA CREATION  left arm radiocephlic;  Surgeon: Marty Heck, MD;  Location: [place];  Service: Vascular;  Laterality: Left;  . AV FISTULA PLACEMENT Left 08/01/2019   Procedure: LEFT BRACHIOCEPHALIC ARTERIOVENOUS (AV) FISTULA CREATION;  Surgeon: [name], MD;  Location: [place];  Service: Vascular;  Laterality: Left;  . BASCILIC VEIN TRANSPOSITION Left 10/03/2019   Procedure: BASILIC VEIN TRANSPOSITION LEFT SECOND STAGE;   Surgeon: [name], MD;  Location: [place];  Service: Vascular;  Laterality: Left;  . CARDIAC CATHETERIZATION  10/2002; 12/19/2004   [name] 03/08/2011  . COLONOSCOPY  2013   Dr.Jacob [place] Henrene Pastor   . CORONARY ARTERY BYPASS GRAFT  02/24/2003   CABG X2/notes 03/08/2011  . CORONARY STENT INTERVENTION N/A 12/19/2019   Procedure: CORONARY STENT INTERVENTION;  Surgeon: Jettie Booze, MD;  Location: [place] CV LAB;  Service: Cardiovascular;  Laterality: N/A;  . ESOPHAGOGASTRODUODENOSCOPY (EGD) WITH PROPOFOL N/A 02/08/2019   Procedure: ESOPHAGOGASTRODUODENOSCOPY (EGD) WITH PROPOFOL;  Surgeon: Milus Banister, MD;  Location: [place];  Service: Gastroenterology;  Laterality: N/A;  . ESOPHAGOGASTRODUODENOSCOPY (EGD) WITH PROPOFOL N/A 12/22/2019   Procedure: ESOPHAGOGASTRODUODENOSCOPY (EGD) WITH PROPOFOL;  Surgeon: Lavena Bullion, DO;  Location: [place];  Service: Gastroenterology;  Laterality: N/A;  . HEMOSTASIS CLIP PLACEMENT  12/22/2019   Procedure: HEMOSTASIS CLIP PLACEMENT;  Surgeon: Lavena Bullion, DO;  Location: [place];  Service: Gastroenterology;;  . HOT HEMOSTASIS N/A 02/08/2019   Procedure: HOT HEMOSTASIS (ARGON PLASMA COAGULATION/BICAP);  Surgeon: Milus Banister, MD;  Location: [facility] ENDOSCOPY;  Service: Gastroenterology;  Laterality: N/A;  . HOT HEMOSTASIS N/A 12/22/2019   Procedure: HOT HEMOSTASIS (ARGON PLASMA COAGULATION/BICAP);  Surgeon: Lavena Bullion, DO;  Location: [facility] ENDOSCOPY;  Service: Gastroenterology;  Laterality: N/A;  . INTERTROCHANTERIC HIP FRACTURE SURGERY Left 11/2006   [name] 03/08/2011  . INTRAVASCULAR ULTRASOUND/IVUS N/A 12/19/2019   Procedure: Intravascular Ultrasound/IVUS;  Surgeon: Jettie Booze, MD;  Location: [place] CV LAB;  Service: Cardiovascular;  Laterality: N/A;  . IR FLUORO GUIDE CV LINE RIGHT  07/24/2019  . IR FLUORO GUIDE CV LINE RIGHT  07/30/2019  . IR US GUIDE VASC ACCESS RIGHT  07/24/2019  . IR US GUIDE VASC ACCESS RIGHT   07/30/2019  . LAPAROSCOPIC CHOLECYSTECTOMY  05/2006  . LIGATION OF COMPETING BRANCHES OF ARTERIOVENOUS FISTULA Left 11/05/2018   Procedure: LIGATION OF COMPETING BRANCHES OF ARTERIOVENOUS FISTULA  LEFT  ARM;  Surgeon: Marty Heck, MD;  Location: [place];  Service: Vascular;  Laterality: Left;  . LUMBAR LAMINECTOMY/DECOMPRESSION MICRODISCECTOMY N/A 02/29/2016   Procedure: Left L4-5 Lateral Recess Decompression, Removal Extradural Intraspinal Facet Cyst;  Surgeon: Marybelle Killings, MD;  Location: [place];  Service: Orthopedics;  Laterality: N/A;  . MULTIPLE TOOTH EXTRACTIONS    . ORIF MANDIBULAR FRACTURE Left 08/13/2004   ORIF of left body fracture mandible with KLS Martin 2.3-mm six hole/notes 03/08/2011  . RIGHT/LEFT HEART CATH AND CORONARY/GRAFT ANGIOGRAPHY N/A 12/19/2019   Procedure: RIGHT/LEFT HEART CATH AND CORONARY/GRAFT ANGIOGRAPHY;  Surgeon: Jettie Booze, MD;  Location: [place] [facility];  Service: Cardiovascular;  Laterality: N/A;  . SCLEROTHERAPY  12/22/2019   Procedure: SCLEROTHERAPY;  Surgeon: Lavena Bullion, DO;  Location: [facility];  Service: Gastroenterology;;  Social History Jacob [name]  reports that he has been smoking cigarettes. He has a 21.50 pack-year smoking history. He has never used smokeless tobacco. He reports previous alcohol use of about 12.0 standard drinks of alcohol per week. He reports that he does not use drugs.  family history includes Alzheimer's disease in his mother and sister; Diabetes in his brother, brother, and sister; Drug abuse in his brother; Heart attack in his brother; Heart failure in his father; Hypertension in his brother and father; Stroke in his sister.  Allergies  Allergen Reactions  . Augmentin [Amoxicillin-Pot Clavulanate] Diarrhea    Severe  . Amphetamines Other (See Comments)    Unknown reaction type       PHYSICAL EXAMINATION: Vital signs: BP 128/60   Pulse 72   Ht 6\' 2"  (1.88 m)   Wt 176 lb (79.8 kg)   BMI  22.60 kg/m   Constitutional: Pleasant, chronically ill-appearing, no acute distress Psychiatric: alert and oriented x3, cooperative Eyes: extraocular movements intact, anicteric, conjunctiva pink Mouth: oral pharynx moist, no lesions Neck: supple no lymphadenopathy Cardiovascular: heart regular rate and rhythm, no murmur Lungs: clear to auscultation bilaterally Abdomen: soft, nontender, nondistended, no obvious ascites, no peritoneal signs, normal bowel sounds, no organomegaly Rectal: Omitted Extremities: no clubbing or cyanosis.  1+ lower extremity edema bilaterally.  Functioning AV fistula left upper extremity above the elbow. Skin: no lesions on visible extremities Neuro: No focal deficits.  Cranial nerves intact  ASSESSMENT:  1.  Overt and occult GI blood loss secondary to upper GI AVMs status post several sessions of endoscopy with endoscopic hemostatic therapy.  Currently stable with hemoglobin above baseline and no evidence of bleeding.  His problem with bleeding AVMs is exacerbated by his need for Brilinta therapy 2.  Colonoscopy 2013 - for neoplasia 3.  Multiple significant medical problems including coronary artery disease, on Brilinta, and end-stage renal disease on dialysis   PLAN:  1.  Continue to follow with hematology regarding chronic anemia to assure adequate iron replacement and appropriate adjuvant therapies in a patient with end-stage renal disease.  Monitor periodic blood counts and transfuse as needed. 2.  No plans for upper endoscopy unless evidence of GI bleeding 3.  No strong indication for colonoscopy given the certain nature of his GI bleeding issues and negative previous examination, as described. 4.  Continue PPI 5.  GI follow-up as needed A total time of 35 minutes was spent preparing to see the patient, reviewing outside tests and various patient encounters.  Obtaining comprehensive history and performing medically appropriate physical examination.   Counseling the patient regarding his above listed issues.  Reviewing GI medications and documenting clinical information in the health record.

## 2020-06-24 DIAGNOSIS — D509 Iron deficiency anemia, unspecified: Secondary | ICD-10-CM | POA: Diagnosis not present

## 2020-06-24 DIAGNOSIS — D631 Anemia in chronic kidney disease: Secondary | ICD-10-CM | POA: Diagnosis not present

## 2020-06-24 DIAGNOSIS — E1122 Type 2 diabetes mellitus with diabetic chronic kidney disease: Secondary | ICD-10-CM | POA: Diagnosis not present

## 2020-06-24 DIAGNOSIS — N2581 Secondary hyperparathyroidism of renal origin: Secondary | ICD-10-CM | POA: Diagnosis not present

## 2020-06-24 DIAGNOSIS — N186 End stage renal disease: Secondary | ICD-10-CM | POA: Diagnosis not present

## 2020-06-24 DIAGNOSIS — Z992 Dependence on renal dialysis: Secondary | ICD-10-CM | POA: Diagnosis not present

## 2020-06-29 DIAGNOSIS — E1122 Type 2 diabetes mellitus with diabetic chronic kidney disease: Secondary | ICD-10-CM | POA: Diagnosis not present

## 2020-06-29 DIAGNOSIS — D631 Anemia in chronic kidney disease: Secondary | ICD-10-CM | POA: Diagnosis not present

## 2020-06-29 DIAGNOSIS — Z992 Dependence on renal dialysis: Secondary | ICD-10-CM | POA: Diagnosis not present

## 2020-06-29 DIAGNOSIS — N2581 Secondary hyperparathyroidism of renal origin: Secondary | ICD-10-CM | POA: Diagnosis not present

## 2020-06-29 DIAGNOSIS — N186 End stage renal disease: Secondary | ICD-10-CM | POA: Diagnosis not present

## 2020-06-29 DIAGNOSIS — D509 Iron deficiency anemia, unspecified: Secondary | ICD-10-CM | POA: Diagnosis not present

## 2020-06-30 ENCOUNTER — Ambulatory Visit (INDEPENDENT_AMBULATORY_CARE_PROVIDER_SITE_OTHER): Payer: Medicare Other | Admitting: Internal Medicine

## 2020-06-30 ENCOUNTER — Encounter: Payer: Self-pay | Admitting: Internal Medicine

## 2020-06-30 ENCOUNTER — Other Ambulatory Visit: Payer: Self-pay

## 2020-06-30 DIAGNOSIS — B20 Human immunodeficiency virus [HIV] disease: Secondary | ICD-10-CM

## 2020-06-30 MED ORDER — BIKTARVY 50-200-25 MG PO TABS: 1.0000 | ORAL_TABLET | Freq: Every day | ORAL | 11 refills | Status: DC

## 2020-06-30 MED ORDER — BIKTARVY 50-200-25 MG PO TABS
1.0000 | ORAL_TABLET | Freq: Every day | ORAL | 11 refills | Status: DC
Start: 2020-06-30 — End: 2020-06-30

## 2020-06-30 NOTE — Addendum Note (Signed)
Addended by: Eugenia Mcalpine on: 06/30/2020 12:19 PM   Modules accepted: Orders

## 2020-06-30 NOTE — Progress Notes (Signed)
Patient request [facility] Rx be sent to [facility]. Order updated and canceled at [facility]

## 2020-06-30 NOTE — Progress Notes (Signed)
Patient Active Problem List   Diagnosis Date Noted  . Human immunodeficiency virus (HIV) disease ([place]) 09/02/2006    Priority: High  . Cardiomyopathy ([place]) 12/21/2019  . Iron deficiency anemia   . Acute blood loss anemia   . Acute on chronic anemia   . AVM (arteriovenous malformation) of duodenum, acquired   . Abnormal nuclear stress test 12/19/2019  . ESRD (end stage renal disease) on dialysis ([place])   . Hand pain, left   . Cocaine abuse ([place])   . Hypertensive emergency   . CHF exacerbation ([place]) 07/21/2019  . Diabetic foot ulcer ([place]) 02/11/2019  . Gastric AVM   . Melena 02/07/2019  . CKD stage 4 due to type 2 diabetes mellitus ([place]) 05/30/2018  . Hyperlipidemia associated with type 2 diabetes mellitus ([place]) 05/30/2018  . Right foot ulcer ([place]) 02/28/2018  . Chronic obstructive pulmonary disease ([place]) 02/28/2018  . Anemia of chronic disease 11/20/2016  . CKD (chronic kidney disease) stage 3, GFR 30-59 ml/min 11/20/2016  . CHF (congestive heart failure) ([place]) 11/10/2016  . Pneumonia 08/02/2016  . History of lumbar laminectomy for spinal cord decompression 02/29/2016  . Type 2 diabetes mellitus with vascular disease ([place]) 06/17/2015  . HTN (hypertension) 04/26/2015  . DM (diabetes mellitus) ([place]) 04/26/2015  . Ischemic cardiomyopathy 05/12/2014  . Ulcer of lower extremity ([place]) 09/06/2013  . Chest pain 09/06/2013  . Type II or unspecified type diabetes mellitus with unspecified complication, not stated as uncontrolled 10/22/2012  . Bunion of left foot 11/25/2011  . Bunion, right foot 11/25/2011  . Polysubstance abuse ([place]) 07/28/2011  . Hip fracture, left ([place]) 04/04/2011  . Closed fracture of neck of femur ([place]) 04/04/2011  . Insomnia 02/18/2011  . CAD (coronary artery disease) 04/20/2009  . Acute on chronic combined systolic (congestive) and diastolic (congestive) heart failure ([place]) 04/20/2009  . HLD (hyperlipidemia) 11/20/2006  . Gout 11/20/2006  .  TOBACCO ABUSE 11/20/2006  . Essential hypertension 11/20/2006    Patient's Medications  New Prescriptions   No medications on file  Previous Medications   ACETAMINOPHEN (TYLENOL) 650 MG CR TABLET    Take 650 mg by mouth every 8 (eight) hours as needed for pain.   ALLOPURINOL (ZYLOPRIM) 100 MG TABLET    TAKE 1 TABLET(100 MG) BY MOUTH DAILY   ANORO ELLIPTA 62.5-25 MCG/INH AEPB    INHALE 1 PUFF BY MOUTH EVERY DAY   B-D UF III MINI PEN NEEDLES 31G X 5 MM MISC    USE FOUR TIMES DAILY   CADEXOMER IODINE (IODOSORB) 0.9 % GEL    Apply 1 application topically daily as needed for wound care (Apply to fifth right toe and first left toe daily).   CARVEDILOL (COREG) 3.125 MG TABLET    Take 1 tablet (3.125 mg total) by mouth 2 (two) times daily.   CHOLINE FENOFIBRATE (FENOFIBRIC ACID) 135 MG CPDR    TAKE 1 CAPSULE BY MOUTH DAILY   COLCHICINE 0.6 MG TABLET    Take 0.6 mg by mouth daily as needed (pain).   DICLOFENAC SODIUM (VOLTAREN) 1 % GEL    Apply 2 g topically 3 (three) times daily as needed (pain).   GABAPENTIN (NEURONTIN) 300 MG CAPSULE    TAKE 1 CAPSULE BY MOUTH THREE TIMES DAILY   INSULIN LISPRO (HUMALOG KWIKPEN) 100 UNIT/ML KWIKPEN    Inject 0.56ml (20 Units total) into the skin 3 (three) times daily as needed for high blood sugar (above 150)  ISOSORBIDE MONONITRATE (IMDUR) 30 MG 24 HR TABLET    Take one tablet by mouth once daily   LANCETS (ONETOUCH DELICA PLUS [id number]) MISC    Inject 1 Device as directed 3 (three) times daily. Dx: E11.40   LATANOPROST (XALATAN) 0.005 % OPHTHALMIC SOLUTION    Place 1 drop into both eyes at bedtime.   LIDOCAINE-PRILOCAINE (EMLA) CREAM    SMARTSIG:Sparingly Topical   MULTIVITAMIN (RENA-VIT) TABS TABLET    Take 1 tablet by mouth daily.   MUPIROCIN OINTMENT (BACTROBAN) 2 %    Apply to right foot and left hallux once daily.   NITROGLYCERIN (NITROSTAT) 0.3 MG SL TABLET    ONE TABLET UNDER TONGUE AS NEEDED FOR CHEST PAIN   ONETOUCH ULTRA TEST STRIP    CHECK BLOOD  SUGAR 3 TIMES  DAILY   PANTOPRAZOLE (PROTONIX) 40 MG TABLET    TAKE 1 TABLET(40 MG) BY MOUTH DAILY   SEVELAMER CARBONATE (RENVELA) 800 MG TABLET    Take 800 mg by mouth 3 (three) times daily with meals.   SIMETHICONE (MYLICON) 80 MG CHEWABLE TABLET    Chew 1 tablet (80 mg total) by mouth 4 (four) times daily as needed for flatulence.   TICAGRELOR (BRILINTA) 90 MG TABS TABLET    Take 1 tablet (90 mg total) by mouth 2 (two) times daily.   TIMOLOL (BETIMOL) 0.5 % OPHTHALMIC SOLUTION    Apply to eye.   TIMOLOL (TIMOPTIC) 0.5 % OPHTHALMIC SOLUTION    Place 1 drop into both eyes every morning.  Modified Medications   Modified Medication Previous Medication   BICTEGRAVIR-EMTRICITABINE-TENOFOVIR AF (BIKTARVY) 50-200-25 MG TABS TABLET BIKTARVY 50-200-25 MG TABS tablet      Take 1 tablet by mouth daily.    TAKE 1 TABLET BY MOUTH DAILY  Discontinued Medications   No medications on file    Subjective: Jacob [name] is in for his routine HIV follow-up visit.  He denies any problems obtaining, taking or tolerating his Biktarvy.  He does not recall missing any doses.  He has received both doses of his Covid vaccine and plans on getting his booster in October.  He denies feeling anxious or depressed but says he struggles with hemodialysis.  He does not feel like anyone in the dialysis center listens to him.  Review of Systems: Review of Systems  Constitutional: Negative for fever and malaise/fatigue.  Respiratory: Negative for cough and shortness of breath.   Cardiovascular: Negative for chest pain.  Psychiatric/Behavioral: Negative for depression. The patient is not nervous/anxious.     Past Medical History:  Diagnosis Date  . Acute respiratory failure ([place]) 03/01/2018  . Arthritis    "all over; mostly knees and back" (02/28/2018)  . CHF (congestive heart failure) ([facility])    not on any meds  . Chronic lower back pain    stenosis  . Community acquired pneumonia 09/06/2013  . COPD (chronic obstructive pulmonary  disease) ([name])   . Coronary atherosclerosis of native coronary artery 2005   s/p surgery  . Drug abuse ([place])    hx; tested for cocaine as recently as 2/08. says he is not using drugs now - avoided defib. for this reason   . ESRD (end stage renal disease) ([place])    Hemo M-W-F- Richarda Blade  . GERD (gastroesophageal reflux disease)    takes OTC meds as needed  . GI bleeding 02/06/2019  . Glaucoma    uses eye drops daily  . Hepatitis B 1968   "tx'd w/isolation; caught it from  toilet stools in gym"  . History of blood transfusion 03/01/2019  . History of colon polyps    benign  . History of gout    takes Allopurinol daily as well as Colchicine-if needed (02/28/2018)  . History of kidney stones   . HTN (hypertension)    takes Coreg,Imdur.and Apresoline daily  . Human immunodeficiency virus (HIV) disease ([place]) dx'd 1995   takes Genvoya daily  . Hyperlipidemia    takes Atorvastatin daily  . Ischemic cardiomyopathy   . Muscle spasm    takes Zanaflex as needed  . Myocardial infarction ([place]) ~ 2004/2005  . Nocturia   . Peripheral neuropathy    takes gabapentin daily  . Pneumonia    "at least twice" (02/28/2018)  . Shortness of breath dyspnea    rarely but if notices it then with exertion  . Syphilis, unspecified   . Type II diabetes mellitus ([place]) 2004   Lantus daily.Average fasting blood sugar 125-199  . Wears glasses   . Wears partial dentures     Social History   Tobacco Use  . Smoking status: Current Every Day Smoker    Packs/day: 0.50    Years: 43.00    Pack years: 21.50    Types: Cigarettes  . Smokeless tobacco: Never Used  Vaping Use  . Vaping Use: Never used  Substance Use Topics  . Alcohol use: Yes    Alcohol/week: 12.0 standard drinks    Types: 12 Standard drinks or equivalent per week    Comment: occ  . Drug use: No    Types: Cocaine    Comment: hx of crack/cocaine 75yrs ago 10/01/2019- none    Family History  Problem Relation Age of Onset  . Heart failure  Father   . Hypertension Father   . Diabetes Brother   . Heart attack Brother   . Alzheimer's disease Mother   . Stroke Sister   . Diabetes Sister   . Alzheimer's disease Sister   . Hypertension Brother   . Diabetes Brother   . Drug abuse Brother   . Colon cancer Neg Hx     Allergies  Allergen Reactions  . Augmentin [Amoxicillin-Pot Clavulanate] Diarrhea    Severe  . Amphetamines Other (See Comments)    Unknown reaction type    Health Maintenance  Topic Date Due  . COVID-19 Vaccine (2 - Pfizer 2-dose series) 12/07/2019  . HEMOGLOBIN A1C  05/06/2020  . INFLUENZA VACCINE  05/24/2020  . OPHTHALMOLOGY EXAM  10/07/2020  . LIPID PANEL  12/20/2020  . FOOT EXAM  12/30/2020  . COLONOSCOPY  10/01/2022  . TETANUS/TDAP  06/13/2029  . Hepatitis C Screening  Completed  . PNA vac Low Risk Adult  Completed    Objective:  Vitals:   06/30/20 1029  BP: (!) 118/56  Pulse: 87  Temp: 97.8 F (36.6 C)  TempSrc: Oral  Weight: 167 lb (75.8 kg)   Body mass index is 21.44 kg/m.  Physical Exam Constitutional:      Comments: He is in good spirits.  Cardiovascular:     Rate and Rhythm: Normal rate and regular rhythm.     Heart sounds: No murmur heard.   Pulmonary:     Effort: Pulmonary effort is normal.     Breath sounds: Normal breath sounds.  Abdominal:     Palpations: Abdomen is soft.     Tenderness: There is no abdominal tenderness.  Psychiatric:        Mood and Affect: Mood normal.  Lab Results Lab Results  Component Value Date   WBC 4.7 03/19/2020   HGB 8.2 (L) 03/19/2020   HCT 26.6 (L) 03/19/2020   MCV 100.0 03/19/2020   PLT 222 03/19/2020    Lab Results  Component Value Date   CREATININE 3.15 (H) 03/11/2020   BUN 32 (H) 03/11/2020   NA 134 (L) 03/11/2020   K 3.1 (L) 03/11/2020   CL 93 (L) 03/11/2020   CO2 27 03/11/2020    Lab Results  Component Value Date   ALT 214 (H) 10/21/2019   AST 177 (H) 10/21/2019   ALKPHOS 90 02/05/2020   BILITOT 1.0  10/21/2019    Lab Results  Component Value Date   CHOL 65 12/21/2019   HDL 17 (L) 12/21/2019   LDLCALC 29 12/21/2019   TRIG 95 12/21/2019   CHOLHDL 3.8 12/21/2019   Lab Results  Component Value Date   LABRPR REACTIVE (A) 06/24/2019   RPRTITER 1:1 (H) 06/24/2019   HIV 1 RNA Quant (copies/mL)  Date Value  06/24/2019 <20 NOT DETECTED  06/18/2018 <20 DETECTED (A)  04/25/2018 <20 DETECTED (A)   CD4 T Cell Abs (/uL)  Date Value  06/24/2019 927  06/18/2018 1,220  12/12/2017 860     Problem List Items Addressed This Visit      High   Human immunodeficiency virus (HIV) disease ([place])    His infection has been under excellent, long-term control.  He will get blood work today, continue Airline pilot and follow-up in 1 year.      Relevant Medications   bictegravir-emtricitabine-tenofovir AF (BIKTARVY) 50-200-25 MG TABS tablet   Other Relevant Orders   T-helper cell (CD4)- ([facility] clinic only)   HIV-1 RNA quant-no reflex-bld   CBC   Comprehensive metabolic panel   RPR        [name], MD [facility] [place] [phone] pager   [phone] cell 06/30/2020, 10:47 AM

## 2020-06-30 NOTE — Assessment & Plan Note (Signed)
His infection has been under excellent, long-term control.  He will get blood work today, continue Airline pilot and follow-up in 1 year.

## 2020-07-01 ENCOUNTER — Encounter: Payer: Medicare Other | Admitting: Nurse Practitioner

## 2020-07-01 DIAGNOSIS — N2581 Secondary hyperparathyroidism of renal origin: Secondary | ICD-10-CM | POA: Diagnosis not present

## 2020-07-01 DIAGNOSIS — E1122 Type 2 diabetes mellitus with diabetic chronic kidney disease: Secondary | ICD-10-CM | POA: Diagnosis not present

## 2020-07-01 DIAGNOSIS — N186 End stage renal disease: Secondary | ICD-10-CM | POA: Diagnosis not present

## 2020-07-01 DIAGNOSIS — D509 Iron deficiency anemia, unspecified: Secondary | ICD-10-CM | POA: Diagnosis not present

## 2020-07-01 DIAGNOSIS — D631 Anemia in chronic kidney disease: Secondary | ICD-10-CM | POA: Diagnosis not present

## 2020-07-01 DIAGNOSIS — Z992 Dependence on renal dialysis: Secondary | ICD-10-CM | POA: Diagnosis not present

## 2020-07-01 LAB — T-HELPER CELL (CD4) - (RCID CLINIC ONLY)
CD4 % Helper T Cell: 36 % (ref 33–65)
CD4 T Cell Abs: 403 /uL (ref 400–1790)

## 2020-07-03 DIAGNOSIS — Z992 Dependence on renal dialysis: Secondary | ICD-10-CM | POA: Diagnosis not present

## 2020-07-03 DIAGNOSIS — E1122 Type 2 diabetes mellitus with diabetic chronic kidney disease: Secondary | ICD-10-CM | POA: Diagnosis not present

## 2020-07-03 DIAGNOSIS — D509 Iron deficiency anemia, unspecified: Secondary | ICD-10-CM | POA: Diagnosis not present

## 2020-07-03 DIAGNOSIS — D631 Anemia in chronic kidney disease: Secondary | ICD-10-CM | POA: Diagnosis not present

## 2020-07-03 DIAGNOSIS — N2581 Secondary hyperparathyroidism of renal origin: Secondary | ICD-10-CM | POA: Diagnosis not present

## 2020-07-03 DIAGNOSIS — N186 End stage renal disease: Secondary | ICD-10-CM | POA: Diagnosis not present

## 2020-07-03 LAB — COMPREHENSIVE METABOLIC PANEL
AG Ratio: 1.1 (calc) (ref 1.0–2.5)
ALT: 10 U/L (ref 9–46)
AST: 15 U/L (ref 10–35)
Albumin: 3.5 g/dL — ABNORMAL LOW (ref 3.6–5.1)
Alkaline phosphatase (APISO): 65 U/L (ref 35–144)
BUN/Creatinine Ratio: 12 (calc) (ref 6–22)
BUN: 89 mg/dL — ABNORMAL HIGH (ref 7–25)
CO2: 29 mmol/L (ref 20–32)
Calcium: 8.7 mg/dL (ref 8.6–10.3)
Chloride: 98 mmol/L (ref 98–110)
Creat: 7.36 mg/dL — ABNORMAL HIGH (ref 0.70–1.18)
Globulin: 3.1 g/dL (calc) (ref 1.9–3.7)
Glucose, Bld: 165 mg/dL — ABNORMAL HIGH (ref 65–99)
Potassium: 5 mmol/L (ref 3.5–5.3)
Sodium: 140 mmol/L (ref 135–146)
Total Bilirubin: 0.5 mg/dL (ref 0.2–1.2)
Total Protein: 6.6 g/dL (ref 6.1–8.1)

## 2020-07-03 LAB — RPR TITER: RPR Titer: 1:1 {titer} — ABNORMAL HIGH

## 2020-07-03 LAB — CBC
HCT: 24.8 % — ABNORMAL LOW (ref 38.5–50.0)
Hemoglobin: 8.1 g/dL — ABNORMAL LOW (ref 13.2–17.1)
MCH: 32.4 pg (ref 27.0–33.0)
MCHC: 32.7 g/dL (ref 32.0–36.0)
MCV: 99.2 fL (ref 80.0–100.0)
MPV: 12.2 fL (ref 7.5–12.5)
Platelets: 150 10*3/uL (ref 140–400)
RBC: 2.5 10*6/uL — ABNORMAL LOW (ref 4.20–5.80)
RDW: 13.6 % (ref 11.0–15.0)
WBC: 7.5 10*3/uL (ref 3.8–10.8)

## 2020-07-03 LAB — HIV-1 RNA QUANT-NO REFLEX-BLD
HIV 1 RNA Quant: <20 Copies/mL
HIV-1 RNA Quant, Log: <1.3 Log cps/mL

## 2020-07-03 LAB — RPR: RPR Ser Ql: REACTIVE — AB

## 2020-07-03 LAB — FLUORESCENT TREPONEMAL AB(FTA)-IGG-BLD: Fluorescent Treponemal ABS: REACTIVE — AB

## 2020-07-06 ENCOUNTER — Encounter: Payer: Medicare Other | Admitting: Nurse Practitioner

## 2020-07-06 DIAGNOSIS — D631 Anemia in chronic kidney disease: Secondary | ICD-10-CM | POA: Diagnosis not present

## 2020-07-06 DIAGNOSIS — N2581 Secondary hyperparathyroidism of renal origin: Secondary | ICD-10-CM | POA: Diagnosis not present

## 2020-07-06 DIAGNOSIS — N186 End stage renal disease: Secondary | ICD-10-CM | POA: Diagnosis not present

## 2020-07-06 DIAGNOSIS — Z992 Dependence on renal dialysis: Secondary | ICD-10-CM | POA: Diagnosis not present

## 2020-07-06 DIAGNOSIS — E1122 Type 2 diabetes mellitus with diabetic chronic kidney disease: Secondary | ICD-10-CM | POA: Diagnosis not present

## 2020-07-06 DIAGNOSIS — D509 Iron deficiency anemia, unspecified: Secondary | ICD-10-CM | POA: Diagnosis not present

## 2020-07-07 ENCOUNTER — Other Ambulatory Visit: Payer: Self-pay

## 2020-07-07 ENCOUNTER — Ambulatory Visit (INDEPENDENT_AMBULATORY_CARE_PROVIDER_SITE_OTHER): Payer: Medicare Other | Admitting: Nurse Practitioner

## 2020-07-07 ENCOUNTER — Encounter: Payer: Self-pay | Admitting: Nurse Practitioner

## 2020-07-07 ENCOUNTER — Telehealth: Payer: Self-pay

## 2020-07-07 DIAGNOSIS — Z0000 Encounter for general adult medical examination without abnormal findings: Secondary | ICD-10-CM

## 2020-07-07 NOTE — Progress Notes (Signed)
This service is provided via telemedicine  No vital signs collected/recorded due to the encounter was a telemedicine visit.   Location of patient (ex: home, work):  Home  Patient consents to a telephone visit:  Yes, see encounter dated 07/07/2020  Location of the provider (ex: office, home):  [place]  Name of any referring provider:  N/A  Names of all persons participating in the telemedicine service and their role in the encounter:  Sherrie Mustache, Nurse Practitioner, Carroll Kinds, CMA, and patient.   Time spent on call:  15 minutes with medical assistant

## 2020-07-07 NOTE — Progress Notes (Signed)
Subjective:   Jacob [name] is a 69 y.o. male who presents for Medicare Annual/Subsequent preventive examination.  Review of Systems           Objective:    There were no vitals filed for this visit. There is no height or weight on file to calculate BMI.  Advanced Directives 07/07/2020 03/11/2020 01/28/2020 12/19/2019 12/19/2019 10/29/2019 10/21/2019  Does Patient Have a Medical Advance Directive? Yes No Yes Yes Yes Yes No  Type of Paramedic of [place];Living will - [place];Living will Healthcare Power of [place];Living will Living will -  Does patient want to make changes to medical advance directive? - - No - Patient declined No - Patient declined - No - Patient declined -  Copy of [place] in Chart? Yes - validated most recent copy scanned in chart (See row information) - Yes - validated most recent copy scanned in chart (See row information) Yes - validated most recent copy scanned in chart (See row information) - - -  Would patient like information on creating a medical advance directive? - No - Patient declined - - - - -    Current Medications (verified) Outpatient Encounter Medications as of 07/07/2020  Medication Sig   acetaminophen (TYLENOL) 650 MG CR tablet Take 650 mg by mouth every 8 (eight) hours as needed for pain.   allopurinol (ZYLOPRIM) 100 MG tablet TAKE 1 TABLET(100 MG) BY MOUTH DAILY   ANORO ELLIPTA 62.5-25 MCG/INH AEPB INHALE 1 PUFF BY MOUTH EVERY DAY   B-D UF III MINI PEN NEEDLES 31G X 5 MM MISC USE FOUR TIMES DAILY   bictegravir-emtricitabine-tenofovir AF (BIKTARVY) 50-200-25 MG TABS tablet Take 1 tablet by mouth daily.   carvedilol (COREG) 3.125 MG tablet Take 1 tablet (3.125 mg total) by mouth 2 (two) times daily.   Choline Fenofibrate (FENOFIBRIC ACID) 135 MG CPDR TAKE 1 CAPSULE BY MOUTH DAILY   colchicine 0.6 MG tablet Take 0.6 mg by mouth daily as needed  (pain).   diclofenac Sodium (VOLTAREN) 1 % GEL Apply 2 g topically 3 (three) times daily as needed (pain).   gabapentin (NEURONTIN) 300 MG capsule TAKE 1 CAPSULE BY MOUTH THREE TIMES DAILY (Patient taking differently: Take 300 mg by mouth 2 (two) times daily. )   insulin lispro (HUMALOG KWIKPEN) 100 UNIT/ML KwikPen Inject 0.63ml (20 Units total) into the skin 3 (three) times daily as needed for high blood sugar (above 150)   isosorbide mononitrate (IMDUR) 30 MG 24 hr tablet Take one tablet by mouth once daily   Lancets (ONETOUCH DELICA PLUS NOMVEH20N) MISC Inject 1 Device as directed 3 (three) times daily. Dx: E11.40   latanoprost (XALATAN) 0.005 % ophthalmic solution Place 1 drop into both eyes at bedtime.   lidocaine-prilocaine (EMLA) cream SMARTSIG:Sparingly Topical   multivitamin (RENA-VIT) TABS tablet Take 1 tablet by mouth daily.   mupirocin ointment (BACTROBAN) 2 % Apply to right foot and left hallux once daily.   nitroGLYCERIN (NITROSTAT) 0.3 MG SL tablet ONE TABLET UNDER TONGUE AS NEEDED FOR CHEST PAIN   ONETOUCH ULTRA test strip CHECK BLOOD SUGAR 3 TIMES  DAILY   pantoprazole (PROTONIX) 40 MG tablet TAKE 1 TABLET(40 MG) BY MOUTH DAILY   sevelamer carbonate (RENVELA) 800 MG tablet Take 800 mg by mouth 3 (three) times daily with meals.   simethicone (MYLICON) 80 MG chewable tablet Chew 1 tablet (80 mg total) by mouth 4 (four) times daily as needed  for flatulence.   ticagrelor (BRILINTA) 90 MG TABS tablet Take 1 tablet (90 mg total) by mouth 2 (two) times daily.   timolol (BETIMOL) 0.5 % ophthalmic solution Apply to eye.   timolol (TIMOPTIC) 0.5 % ophthalmic solution Place 1 drop into both eyes every morning.   [DISCONTINUED] cadexomer iodine (IODOSORB) 0.9 % gel Apply 1 application topically daily as needed for wound care (Apply to fifth right toe and first left toe daily).   No facility-administered encounter medications on file as of 07/07/2020.    Allergies  (verified) Augmentin [amoxicillin-pot clavulanate] and Amphetamines   History: Past Medical History:  Diagnosis Date   Acute respiratory failure ([place]) 03/01/2018   Arthritis    "all over; mostly knees and back" (02/28/2018)   CHF (congestive heart failure) (HCC)    not on any meds   Chronic lower back pain    stenosis   Community acquired pneumonia 09/06/2013   COPD (chronic obstructive pulmonary disease) ([place])    Coronary atherosclerosis of native coronary artery 2005   s/p surgery   Drug abuse ([name])    hx; tested for cocaine as recently as 2/08. says he is not using drugs now - avoided defib. for this reason    ESRD (end stage renal disease) ([place])    Hemo M-W-F- Richarda Blade   GERD (gastroesophageal reflux disease)    takes OTC meds as needed   GI bleeding 02/06/2019   Glaucoma    uses eye drops daily   Hepatitis B 1968   "tx'd w/isolation; caught it from toilet stools in gym"   History of blood transfusion 03/01/2019   History of colon polyps    benign   History of gout    takes Allopurinol daily as well as Colchicine-if needed (02/28/2018)   History of kidney stones    HTN (hypertension)    takes Coreg,Imdur.and Apresoline daily   Human immunodeficiency virus (HIV) disease ([place]) dx'd 1995   takes Genvoya daily   Hyperlipidemia    takes Atorvastatin daily   Ischemic cardiomyopathy    Muscle spasm    takes Zanaflex as needed   Myocardial infarction ([place]) ~ 2004/2005   Nocturia    Peripheral neuropathy    takes gabapentin daily   Pneumonia    "at least twice" (02/28/2018)   Shortness of breath dyspnea    rarely but if notices it then with exertion   Syphilis, unspecified    Type II diabetes mellitus ([name]) 2004   Lantus daily.Average fasting blood sugar 125-199   Wears glasses    Wears partial dentures    Past Surgical History:  Procedure Laterality Date   AV FISTULA PLACEMENT Left 08/02/2018   Procedure: ARTERIOVENOUS (AV) FISTULA  CREATION  left arm radiocephlic;  Surgeon: Marty Heck, MD;  Location: [place];  Service: Vascular;  Laterality: Left;   AV FISTULA PLACEMENT Left 08/01/2019   Procedure: LEFT BRACHIOCEPHALIC ARTERIOVENOUS (AV) FISTULA CREATION;  Surgeon: [name], MD;  Location: [place];  Service: Vascular;  Laterality: Left;   [place] Left 10/03/2019   Procedure: BASILIC VEIN TRANSPOSITION LEFT SECOND STAGE;  Surgeon: [name], MD;  Location: [facility] OR;  Service: Vascular;  Laterality: Left;   CARDIAC CATHETERIZATION  10/2002; 12/19/2004   [name] 03/08/2011   COLONOSCOPY  2013   [place]    CORONARY ARTERY BYPASS GRAFT  02/24/2003   CABG X2/notes 03/08/2011   CORONARY STENT INTERVENTION N/A 12/19/2019   Procedure: CORONARY STENT INTERVENTION;  Surgeon:  Jettie Booze, MD;  Location: [place] [facility];  Service: Cardiovascular;  Laterality: N/A;   ESOPHAGOGASTRODUODENOSCOPY (EGD) WITH PROPOFOL N/A 02/08/2019   Procedure: ESOPHAGOGASTRODUODENOSCOPY (EGD) WITH PROPOFOL;  Surgeon: Milus Banister, MD;  Location: [place];  Service: Gastroenterology;  Laterality: N/A;   ESOPHAGOGASTRODUODENOSCOPY (EGD) WITH PROPOFOL N/A 12/22/2019   Procedure: ESOPHAGOGASTRODUODENOSCOPY (EGD) WITH PROPOFOL;  Surgeon: Lavena Bullion, DO;  Location: [place];  Service: Gastroenterology;  Laterality: N/A;   HEMOSTASIS CLIP PLACEMENT  12/22/2019   Procedure: HEMOSTASIS CLIP PLACEMENT;  Surgeon: Lavena Bullion, DO;  Location: [place];  Service: Gastroenterology;;   HOT HEMOSTASIS N/A 02/08/2019   Procedure: HOT HEMOSTASIS (ARGON PLASMA COAGULATION/BICAP);  Surgeon: Milus Banister, MD;  Location: [facility] ENDOSCOPY;  Service: Gastroenterology;  Laterality: N/A;   HOT HEMOSTASIS N/A 12/22/2019   Procedure: HOT HEMOSTASIS (ARGON PLASMA COAGULATION/BICAP);  Surgeon: Lavena Bullion, DO;  Location[facility] ENDOSCOPY;  Service: Gastroenterology;  Laterality: N/A;    INTERTROCHANTERIC HIP FRACTURE SURGERY Left 11/2006   [name] 03/08/2011   INTRAVASCULAR ULTRASOUND/IVUS N/A 12/19/2019   Procedure: Intravascular Ultrasound/IVUS;  Surgeon: Jettie Booze, MD;  Location: [place] [facility];  Service: Cardiovascular;  Laterality: N/A;   IR FLUORO GUIDE CV LINE RIGHT  07/24/2019   IR FLUORO GUIDE CV LINE RIGHT  07/30/2019   IR US GUIDE VASC ACCESS RIGHT  07/24/2019   IR US GUIDE VASC ACCESS RIGHT  07/30/2019   LAPAROSCOPIC CHOLECYSTECTOMY  05/2006   LIGATION OF COMPETING BRANCHES OF ARTERIOVENOUS FISTULA Left 11/05/2018   Procedure: LIGATION OF COMPETING BRANCHES OF ARTERIOVENOUS FISTULA  LEFT  ARM;  Surgeon: Marty Heck, MD;  Location[facility] OR;  Service: Vascular;  Laterality: Left;   LUMBAR LAMINECTOMY/DECOMPRESSION MICRODISCECTOMY N/A 02/29/2016   Procedure: Left L4-5 Lateral Recess Decompression, Removal Extradural Intraspinal Facet Cyst;  Surgeon: Marybelle Killings, MD;  Location: [facility];  Service: Orthopedics;  Laterality: N/A;   MULTIPLE TOOTH EXTRACTIONS     ORIF MANDIBULAR FRACTURE Left 08/13/2004   ORIF of left body fracture mandible with KLS Martin 2.3-mm six hole/notes 03/08/2011   RIGHT/LEFT HEART CATH AND CORONARY/GRAFT ANGIOGRAPHY N/A 12/19/2019   Procedure: RIGHT/LEFT HEART CATH AND CORONARY/GRAFT ANGIOGRAPHY;  Surgeon: Jettie Booze, MD;  Location: [place] [facility];  Service: Cardiovascular;  Laterality: N/A;   SCLEROTHERAPY  12/22/2019   Procedure: SCLEROTHERAPY;  Surgeon: Lavena Bullion, DO;  Location: [facility] ENDOSCOPY;  Service: Gastroenterology;;   Family History  Problem Relation Age of Onset   Heart failure Father    Hypertension Father    Diabetes Brother    Heart attack Brother    Alzheimer's disease Mother    Stroke Sister    Diabetes Sister    Alzheimer's disease Sister    Hypertension Brother    Diabetes Brother    Drug abuse Brother    Colon cancer Neg Hx    Social History   Socioeconomic  History   Marital status: Single    Spouse name: Not on file   Number of children: 1   Years of education: Not on file   Highest education level: Not on file  Occupational History   Occupation: retired  Tobacco Use   Smoking status: Current Every Day Smoker    Packs/day: 0.50    Years: 43.00    Pack years: 21.50    Types: Cigarettes   Smokeless tobacco: Never Used  Scientific laboratory technician Use: Never used  Substance and Sexual Activity   Alcohol  use: Yes    Alcohol/week: 12.0 standard drinks    Types: 12 Standard drinks or equivalent per week    Comment: occ   Drug use: No    Types: Cocaine    Comment: hx of crack/cocaine 57yrs ago 10/01/2019- none   Sexual activity: Yes    Partners: Male  Other Topics Concern   Not on file  Social History Narrative   Retired.       As of 05/2015:   Diet: No salt    Caffeine   Married: Single    House: Condo, 2 stories, 1 person (self)    Pets: No    Current/Past profession: N/A   Exercise: walks daily    Living Will: Yes    DNR   POA/HPOA: No       Social Determinants of Radio broadcast assistant Strain:    Difficulty of Paying Living Expenses: Not on file  Food Insecurity:    Worried About Charity fundraiser in the Last Year: Not on file   [employer] of Food in the Last Year: Not on file  Transportation Needs:    Lack of Transportation (Medical): Not on file   Lack of Transportation (Non-Medical): Not on file  Physical Activity:    Days of Exercise per Week: Not on file   Minutes of Exercise per Session: Not on file  Stress:    Feeling of Stress : Not on file  Social Connections:    Frequency of Communication with Friends and Family: Not on file   Frequency of Social Gatherings with Friends and Family: Not on file   Attends Religious Services: Not on file   Active Member of Clubs or Organizations: Not on file   Attends Archivist Meetings: Not on file   Marital Status: Not on file     Tobacco Counseling Ready to quit: Not Answered Counseling given: Not Answered   Clinical Intake:                 Diabetic?yes          Activities of Daily Living In your present state of health, do you have any difficulty performing the following activities: 12/19/2019 10/03/2019  Hearing? N N  Vision? N N  Difficulty concentrating or making decisions? N N  Walking or climbing stairs? Y N  Dressing or bathing? N N  Doing errands, shopping? N -  Some recent data might be hidden    Patient Care Team: [name], NP as PCP - General (Geriatric Medicine) [name], MD as PCP - Infectious Diseases (Infectious Diseases) [name], MD as PCP - Cardiology (Cardiology) [name], MD as Consulting Physician (Ophthalmology) [name], MD as Consulting Physician (Cardiology) [name], MD as Consulting Physician (Infectious Diseases) Gardiner Barefoot, DPM as Consulting Physician (Podiatry) [name], Nelda Bucks, NP as Nurse Practitioner (Family Medicine) Kidney, [place] any recent Medical Services you may have received from other than [facility] providers in the past year (date may be approximate).     Assessment:   This is a routine wellness examination for Zedric.  Hearing/Vision screen  Hearing Screening   125Hz  250Hz  500Hz  1000Hz  2000Hz  3000Hz  4000Hz  6000Hz  8000Hz   Right ear:           Left ear:           Comments: Patient has no hearing problems.  Vision Screening Comments: Patient has no vision problems and is scheduled for exam December  2021.  Dietary issues and exercise activities discussed:    Goals     Blood Pressure < 140/90     Exercise 3x per week (30 min per time)     Increase excise to 4 times a week      HEMOGLOBIN A1C < 7.0     LDL CALC < 100     Quit smoking / using tobacco     Starting 08/18/16, I will attempt to stop smoking over the next year.       Depression Screen PHQ 2/9 Scores  07/07/2020 06/30/2020 01/28/2020 11/19/2019 10/29/2019 07/08/2019 06/28/2019  PHQ - 2 Score 0 0 0 0 0 0 0  PHQ- 9 Score - - - - - - -    Fall Risk Fall Risk  07/07/2020 06/30/2020 01/28/2020 11/19/2019 10/29/2019  Falls in the past year? 0 0 0 0 0  Comment - - - - -  Number falls in past yr: 0 - 0 0 0  Comment - - - - -  Injury with Fall? 0 - 0 0 0  Comment - - - - -  Risk Factor Category  - - - - -  Risk for fall due to : - - - - -  Risk for fall due to: Comment - - - - -  Follow up - - - - -    Any stairs in or around the home? Yes  If so, are there any without handrails? Yes  Home free of loose throw rugs in walkways, pet beds, electrical cords, etc? Yes  Adequate lighting in your home to reduce risk of falls? Yes   ASSISTIVE DEVICES UTILIZED TO PREVENT FALLS:  Life alert? No  Use of a cane, walker or w/c? No  Grab bars in the bathroom? No  Shower chair or bench in shower? Yes  Elevated toilet seat or a handicapped toilet? Yes   TIMED UP AND GO:  Was the test performed? No .    Cognitive Function: MMSE - Mini Mental State Exam 06/28/2019 06/26/2018 07/21/2017 08/18/2016 08/07/2015  Not completed: - - - - (No Data)  Orientation to time 4 4 5 5 5   Orientation to Place 5 5 5 5 5   Registration 3 3 3 3 3   Attention/ Calculation 5 5 5 5 5   Recall 3 2 2 2 3   Language- name 2 objects 2 2 2 2 2   Language- repeat 1 1 1 1 1   Language- follow 3 step command 3 3 3 3 3   Language- read & follow direction 1 1 1 1 1   Write a sentence 1 1 1 1 1   Copy design 1 0 1 1 0  Total score 29 27 29 29 29      6CIT Screen 07/07/2020  What Year? 0 points  What month? 0 points  What time? 0 points  Count back from 20 0 points  Months in reverse 0 points  Repeat phrase 0 points  Total Score 0    Immunizations Immunization History  Administered Date(s) Administered   Fluad Quad(high Dose 65+) 06/14/2019   Hepatitis B 08/28/2006, 10/02/2007, 04/01/2008   Hepatitis B, adult 06/03/2014, 07/04/2014    Influenza Split 07/28/2011   Influenza Whole 08/28/2006, 09/10/2007, 09/15/2008, 08/03/2009, 07/26/2010   Influenza, High Dose Seasonal PF 07/04/2018   Influenza,inj,Quad PF,6+ Mos 07/04/2014, 07/06/2015, 07/12/2016, 07/11/2017   Influenza-Unspecified 06/24/2013, 06/30/2020   PFIZER SARS-COV-2 Vaccination 11/16/2019, 12/07/2019   Pneumococcal Conjugate-13 08/18/2016   Pneumococcal Polysaccharide-23 08/28/2006, 07/28/2011, 05/30/2018  Tdap 06/14/2019   Zoster 06/03/2014   Zoster Recombinat (Shingrix) 07/24/2017, 01/09/2018    TDAP status: Up to date Flu Vaccine status: Up to date Pneumococcal vaccine status: Up to date Covid-19 vaccine status: Completed vaccines  Qualifies for Shingles Vaccine? Yes   Zostavax completed Yes   Shingrix Completed?: Yes  Screening Tests Health Maintenance  Topic Date Due   HEMOGLOBIN A1C  05/06/2020   OPHTHALMOLOGY EXAM  10/07/2020   LIPID PANEL  12/20/2020   FOOT EXAM  12/30/2020   COLONOSCOPY  10/01/2022   TETANUS/TDAP  06/13/2029   INFLUENZA VACCINE  Completed   COVID-19 Vaccine  Completed   Hepatitis C Screening  Completed   PNA vac Low Risk Adult  Completed    Health Maintenance  Health Maintenance Due  Topic Date Due   HEMOGLOBIN A1C  05/06/2020    Colorectal cancer screening: Completed 2013. Repeat every 10 years  Lung Cancer Screening: (Low Dose CT Chest recommended if Age 15-80 years, 30 pack-year currently smoking OR have quit w/in 15years.) does qualify.   Lung Cancer Screening Referral: CT ordered, pt declines  Additional Screening:  Hepatitis C Screening: does qualify; Completed completed 2012   Vision Screening: Recommended annual ophthalmology exams for early detection of glaucoma and other disorders of the eye. Is the patient up to date with their annual eye exam?  Yes  Who is the provider or what is the name of the office in which the patient attends annual eye exams? [facility]  ophthalmology If pt is not established with a provider, would they like to be referred to a provider to establish care? No .   Dental Screening: Recommended annual dental exams for proper oral hygiene  Community Resource Referral / Chronic Care Management: CRR required this visit?  No   CCM required this visit?  No      Plan:     I have personally reviewed and noted the following in the patients chart:    Medical and social history  Use of alcohol, tobacco or illicit drugs   Current medications and supplements  Functional ability and status  Nutritional status  Physical activity  Advanced directives  List of other physicians  Hospitalizations, surgeries, and ER visits in previous 12 months  Vitals  Screenings to include cognitive, depression, and falls  Referrals and appointments  In addition, I have reviewed and discussed with patient certain preventive protocols, quality metrics, and best practice recommendations. A written personalized care plan for preventive services as well as general preventive health recommendations were provided to patient.     [name], NP   07/07/2020    Virtual Visit via Telephone Note  I connected with@ on 07/07/20 at 10:00 AM EDT by telephone and verified that I am speaking with the correct person using two identifiers.  Location: Patient: home Provider: [facility]   I discussed the limitations, risks, security and privacy concerns of performing an evaluation and management service by telephone and the availability of in person appointments. I also discussed with the patient that there may be a patient responsible charge related to this service. The patient expressed understanding and agreed to proceed.   I discussed the assessment and treatment plan with the patient. The patient was provided an opportunity to ask questions and all were answered. The patient agreed with the plan and demonstrated an understanding  of the instructions.   The patient was advised to call back or seek an in-person evaluation if the symptoms worsen  or if the condition fails to improve as anticipated.  I provided 20 minutes of non-face-to-face time during this encounter.  [name]. Harle Battiest Avs printed and mailed

## 2020-07-07 NOTE — Telephone Encounter (Signed)
Mr. mohd, clemons are scheduled for a virtual visit with your provider today.    Just as we do with appointments in the office, we must obtain your consent to participate.  Your consent will be active for this visit and any virtual visit you may have with one of our providers in the next 365 days.    If you have a MyChart account, I can also send a copy of this consent to you electronically.  All virtual visits are billed to your insurance company just like a traditional visit in the office.  As this is a virtual visit, video technology does not allow for your provider to perform a traditional examination.  This may limit your provider's ability to fully assess your condition.  If your provider identifies any concerns that need to be evaluated in person or the need to arrange testing such as labs, EKG, etc, we will make arrangements to do so.    Although advances in technology are sophisticated, we cannot ensure that it will always work on either your end or our end.  If the connection with a video visit is poor, we may have to switch to a telephone visit.  With either a video or telephone visit, we are not always able to ensure that we have a secure connection.   I need to obtain your verbal consent now.   Are you willing to proceed with your visit today?   YANUEL TAGG has provided verbal consent on 07/07/2020 for a virtual visit (video or telephone).    Carroll Kinds, CMA 07/07/2020  10:16 AM

## 2020-07-07 NOTE — Patient Instructions (Signed)
Jacob [name] , Thank you for taking time to come for your Medicare Wellness Visit. I appreciate your ongoing commitment to your health goals. Please review the following plan we discussed and let me know if I can assist you in the future.   Screening recommendations/referrals: Colonoscopy up to date Recommended yearly ophthalmology/optometry visit for glaucoma screening and checkup Recommended yearly dental visit for hygiene and checkup  Vaccinations: Influenza vaccine up to date Pneumococcal vaccine up to date Tdap vaccine up to date Shingles vaccine up to date    Advanced directives: to review MOST form and and we can complete in office   Conditions/risks identified: cardiovascular disease, advanced age, malnutrition, complications related to diabetes.   Next appointment: 1 year.   Preventive Care 71 Years and Older, Male Preventive care refers to lifestyle choices and visits with your health care provider that can promote health and wellness. What does preventive care include?  A yearly physical exam. This is also called an annual well check.  Dental exams once or twice a year.  Routine eye exams. Ask your health care provider how often you should have your eyes checked.  Personal lifestyle choices, including:  Daily care of your teeth and gums.  Regular physical activity.  Eating a healthy diet.  Avoiding tobacco and drug use.  Limiting alcohol use.  Practicing safe sex.  Taking low doses of aspirin every day.  Taking vitamin and mineral supplements as recommended by your health care provider. What happens during an annual well check? The services and screenings done by your health care provider during your annual well check will depend on your age, overall health, lifestyle risk factors, and family history of disease. Counseling  Your health care provider may ask you questions about your:  Alcohol use.  Tobacco use.  Drug use.  Emotional well-being.  Home  and relationship well-being.  Sexual activity.  Eating habits.  History of falls.  Memory and ability to understand (cognition).  Work and work Statistician. Screening  You may have the following tests or measurements:  Height, weight, and BMI.  Blood pressure.  Lipid and cholesterol levels. These may be checked every 5 years, or more frequently if you are over 67 years old.  Skin check.  Lung cancer screening. You may have this screening every year starting at age 84 if you have a 30-pack-year history of smoking and currently smoke or have quit within the past 15 years.  Fecal occult blood test (FOBT) of the stool. You may have this test every year starting at age 21.  Flexible sigmoidoscopy or colonoscopy. You may have a sigmoidoscopy every 5 years or a colonoscopy every 10 years starting at age 19.  Prostate cancer screening. Recommendations will vary depending on your family history and other risks.  Hepatitis C blood test.  Hepatitis B blood test.  Sexually transmitted disease (STD) testing.  Diabetes screening. This is done by checking your blood sugar (glucose) after you have not eaten for a while (fasting). You may have this done every 1-3 years.  Abdominal aortic aneurysm (AAA) screening. You may need this if you are a current or former smoker.  Osteoporosis. You may be screened starting at age 20 if you are at high risk. Talk with your health care provider about your test results, treatment options, and if necessary, the need for more tests. Vaccines  Your health care provider may recommend certain vaccines, such as:  Influenza vaccine. This is recommended every year.  Tetanus, diphtheria, and  acellular pertussis (Tdap, Td) vaccine. You may need a Td booster every 10 years.  Zoster vaccine. You may need this after age 58.  Pneumococcal 13-valent conjugate (PCV13) vaccine. One dose is recommended after age 68.  Pneumococcal polysaccharide (PPSV23) vaccine.  One dose is recommended after age 83. Talk to your health care provider about which screenings and vaccines you need and how often you need them. This information is not intended to replace advice given to you by your health care provider. Make sure you discuss any questions you have with your health care provider. Document Released: 11/06/2015 Document Revised: 06/29/2016 Document Reviewed: 08/11/2015 Elsevier Interactive Patient Education  2017 [place] Prevention in the Home Falls can cause injuries. They can happen to people of all ages. There are many things you can do to make your home safe and to help prevent falls. What can I do on the outside of my home?  Regularly fix the edges of walkways and driveways and fix any cracks.  Remove anything that might make you trip as you walk through a door, such as a raised step or threshold.  Trim any bushes or trees on the path to your home.  Use bright outdoor lighting.  Clear any walking paths of anything that might make someone trip, such as rocks or tools.  Regularly check to see if handrails are loose or broken. Make sure that both sides of any steps have handrails.  Any raised decks and porches should have guardrails on the edges.  Have any leaves, snow, or ice cleared regularly.  Use sand or salt on walking paths during winter.  Clean up any spills in your garage right away. This includes oil or grease spills. What can I do in the bathroom?  Use night lights.  Install grab bars by the toilet and in the tub and shower. Do not use towel bars as grab bars.  Use non-skid mats or decals in the tub or shower.  If you need to sit down in the shower, use a plastic, non-slip stool.  Keep the floor dry. Clean up any water that spills on the floor as soon as it happens.  Remove soap buildup in the tub or shower regularly.  Attach bath mats securely with double-sided non-slip rug tape.  Do not have throw rugs and other  things on the floor that can make you trip. What can I do in the bedroom?  Use night lights.  Make sure that you have a light by your bed that is easy to reach.  Do not use any sheets or blankets that are too big for your bed. They should not hang down onto the floor.  Have a firm chair that has side arms. You can use this for support while you get dressed.  Do not have throw rugs and other things on the floor that can make you trip. What can I do in the kitchen?  Clean up any spills right away.  Avoid walking on wet floors.  Keep items that you use a lot in easy-to-reach places.  If you need to reach something above you, use a strong step stool that has a grab bar.  Keep electrical cords out of the way.  Do not use floor polish or wax that makes floors slippery. If you must use wax, use non-skid floor wax.  Do not have throw rugs and other things on the floor that can make you trip. What can I do with my stairs?  Do not leave any items on the stairs.  Make sure that there are handrails on both sides of the stairs and use them. Fix handrails that are broken or loose. Make sure that handrails are as long as the stairways.  Check any carpeting to make sure that it is firmly attached to the stairs. Fix any carpet that is loose or worn.  Avoid having throw rugs at the top or bottom of the stairs. If you do have throw rugs, attach them to the floor with carpet tape.  Make sure that you have a light switch at the top of the stairs and the bottom of the stairs. If you do not have them, ask someone to add them for you. What else can I do to help prevent falls?  Wear shoes that:  Do not have high heels.  Have rubber bottoms.  Are comfortable and fit you well.  Are closed at the toe. Do not wear sandals.  If you use a stepladder:  Make sure that it is fully opened. Do not climb a closed stepladder.  Make sure that both sides of the stepladder are locked into place.  Ask  someone to hold it for you, if possible.  Clearly mark and make sure that you can see:  Any grab bars or handrails.  First and last steps.  Where the edge of each step is.  Use tools that help you move around (mobility aids) if they are needed. These include:  Canes.  Walkers.  Scooters.  Crutches.  Turn on the lights when you go into a dark area. Replace any light bulbs as soon as they burn out.  Set up your furniture so you have a clear path. Avoid moving your furniture around.  If any of your floors are uneven, fix them.  If there are any pets around you, be aware of where they are.  Review your medicines with your doctor. Some medicines can make you feel dizzy. This can increase your chance of falling. Ask your doctor what other things that you can do to help prevent falls. This information is not intended to replace advice given to you by your health care provider. Make sure you discuss any questions you have with your health care provider. Document Released: 08/06/2009 Document Revised: 03/17/2016 Document Reviewed: 11/14/2014 Elsevier Interactive Patient Education  2017 [employer].

## 2020-07-08 DIAGNOSIS — N186 End stage renal disease: Secondary | ICD-10-CM | POA: Diagnosis not present

## 2020-07-08 DIAGNOSIS — D509 Iron deficiency anemia, unspecified: Secondary | ICD-10-CM | POA: Diagnosis not present

## 2020-07-08 DIAGNOSIS — N2581 Secondary hyperparathyroidism of renal origin: Secondary | ICD-10-CM | POA: Diagnosis not present

## 2020-07-08 DIAGNOSIS — D631 Anemia in chronic kidney disease: Secondary | ICD-10-CM | POA: Diagnosis not present

## 2020-07-08 DIAGNOSIS — Z992 Dependence on renal dialysis: Secondary | ICD-10-CM | POA: Diagnosis not present

## 2020-07-08 DIAGNOSIS — E1122 Type 2 diabetes mellitus with diabetic chronic kidney disease: Secondary | ICD-10-CM | POA: Diagnosis not present

## 2020-07-09 ENCOUNTER — Telehealth: Payer: Self-pay | Admitting: Nurse Practitioner

## 2020-07-09 NOTE — Telephone Encounter (Signed)
I called the patient 07/09/20 to schedule another AWV for next year and the patient decided to wait a little while before scheduling his next one.

## 2020-07-10 DIAGNOSIS — Z992 Dependence on renal dialysis: Secondary | ICD-10-CM | POA: Diagnosis not present

## 2020-07-10 DIAGNOSIS — E1122 Type 2 diabetes mellitus with diabetic chronic kidney disease: Secondary | ICD-10-CM | POA: Diagnosis not present

## 2020-07-10 DIAGNOSIS — N186 End stage renal disease: Secondary | ICD-10-CM | POA: Diagnosis not present

## 2020-07-10 DIAGNOSIS — D509 Iron deficiency anemia, unspecified: Secondary | ICD-10-CM | POA: Diagnosis not present

## 2020-07-10 DIAGNOSIS — N2581 Secondary hyperparathyroidism of renal origin: Secondary | ICD-10-CM | POA: Diagnosis not present

## 2020-07-10 DIAGNOSIS — D631 Anemia in chronic kidney disease: Secondary | ICD-10-CM | POA: Diagnosis not present

## 2020-07-13 DIAGNOSIS — D509 Iron deficiency anemia, unspecified: Secondary | ICD-10-CM | POA: Diagnosis not present

## 2020-07-13 DIAGNOSIS — E1122 Type 2 diabetes mellitus with diabetic chronic kidney disease: Secondary | ICD-10-CM | POA: Diagnosis not present

## 2020-07-13 DIAGNOSIS — N186 End stage renal disease: Secondary | ICD-10-CM | POA: Diagnosis not present

## 2020-07-13 DIAGNOSIS — N2581 Secondary hyperparathyroidism of renal origin: Secondary | ICD-10-CM | POA: Diagnosis not present

## 2020-07-13 DIAGNOSIS — D631 Anemia in chronic kidney disease: Secondary | ICD-10-CM | POA: Diagnosis not present

## 2020-07-13 DIAGNOSIS — Z992 Dependence on renal dialysis: Secondary | ICD-10-CM | POA: Diagnosis not present

## 2020-07-14 ENCOUNTER — Other Ambulatory Visit: Payer: Self-pay

## 2020-07-14 ENCOUNTER — Encounter: Payer: Self-pay | Admitting: Podiatry

## 2020-07-14 ENCOUNTER — Ambulatory Visit (INDEPENDENT_AMBULATORY_CARE_PROVIDER_SITE_OTHER): Payer: Medicare Other | Admitting: Podiatry

## 2020-07-14 ENCOUNTER — Ambulatory Visit: Payer: Medicare Other | Admitting: Orthotics

## 2020-07-14 DIAGNOSIS — M79675 Pain in left toe(s): Secondary | ICD-10-CM | POA: Diagnosis not present

## 2020-07-14 DIAGNOSIS — N186 End stage renal disease: Secondary | ICD-10-CM

## 2020-07-14 DIAGNOSIS — L84 Corns and callosities: Secondary | ICD-10-CM | POA: Diagnosis not present

## 2020-07-14 DIAGNOSIS — B351 Tinea unguium: Secondary | ICD-10-CM

## 2020-07-14 DIAGNOSIS — L97511 Non-pressure chronic ulcer of other part of right foot limited to breakdown of skin: Secondary | ICD-10-CM

## 2020-07-14 DIAGNOSIS — Z8631 Personal history of diabetic foot ulcer: Secondary | ICD-10-CM

## 2020-07-14 DIAGNOSIS — E08621 Diabetes mellitus due to underlying condition with foot ulcer: Secondary | ICD-10-CM

## 2020-07-14 DIAGNOSIS — Z992 Dependence on renal dialysis: Secondary | ICD-10-CM

## 2020-07-14 DIAGNOSIS — M79674 Pain in right toe(s): Secondary | ICD-10-CM | POA: Diagnosis not present

## 2020-07-14 DIAGNOSIS — E0842 Diabetes mellitus due to underlying condition with diabetic polyneuropathy: Secondary | ICD-10-CM | POA: Diagnosis not present

## 2020-07-14 NOTE — Progress Notes (Signed)
Going to wait until January to get new shoes/inserts.

## 2020-07-15 DIAGNOSIS — D509 Iron deficiency anemia, unspecified: Secondary | ICD-10-CM | POA: Diagnosis not present

## 2020-07-15 DIAGNOSIS — E1122 Type 2 diabetes mellitus with diabetic chronic kidney disease: Secondary | ICD-10-CM | POA: Diagnosis not present

## 2020-07-15 DIAGNOSIS — N2581 Secondary hyperparathyroidism of renal origin: Secondary | ICD-10-CM | POA: Diagnosis not present

## 2020-07-15 DIAGNOSIS — Z992 Dependence on renal dialysis: Secondary | ICD-10-CM | POA: Diagnosis not present

## 2020-07-15 DIAGNOSIS — D631 Anemia in chronic kidney disease: Secondary | ICD-10-CM | POA: Diagnosis not present

## 2020-07-15 DIAGNOSIS — N186 End stage renal disease: Secondary | ICD-10-CM | POA: Diagnosis not present

## 2020-07-17 DIAGNOSIS — D509 Iron deficiency anemia, unspecified: Secondary | ICD-10-CM | POA: Diagnosis not present

## 2020-07-17 DIAGNOSIS — N186 End stage renal disease: Secondary | ICD-10-CM | POA: Diagnosis not present

## 2020-07-17 DIAGNOSIS — D631 Anemia in chronic kidney disease: Secondary | ICD-10-CM | POA: Diagnosis not present

## 2020-07-17 DIAGNOSIS — N2581 Secondary hyperparathyroidism of renal origin: Secondary | ICD-10-CM | POA: Diagnosis not present

## 2020-07-17 DIAGNOSIS — Z992 Dependence on renal dialysis: Secondary | ICD-10-CM | POA: Diagnosis not present

## 2020-07-17 DIAGNOSIS — E1122 Type 2 diabetes mellitus with diabetic chronic kidney disease: Secondary | ICD-10-CM | POA: Diagnosis not present

## 2020-07-18 ENCOUNTER — Other Ambulatory Visit: Payer: Self-pay | Admitting: Nurse Practitioner

## 2020-07-18 DIAGNOSIS — M109 Gout, unspecified: Secondary | ICD-10-CM

## 2020-07-19 NOTE — Progress Notes (Signed)
Subjective:  [name], male    DOB: 1950/10/29,  MRN: [id number]  69 y.o. male is seen for follow up with h/o chroic ulcers  L hallux and submet head 4 right foot.  Risk factors include NIDDM with ESRD on hemodialysis (MWF),  h/o diabetic foot ulcers  He is still wearing Croc type shoes.   Patient relates no new problems on today's visit.  The patient is having no constitutional symptoms, denying fever, chills, anorexia, or weight loss.  Past Medical History:  Diagnosis Date  . Acute respiratory failure ([place]) 03/01/2018  . Arthritis    "all over; mostly knees and back" (02/28/2018)  . CHF (congestive heart failure) ([facility])    not on any meds  . Chronic lower back pain    stenosis  . Community acquired pneumonia 09/06/2013  . COPD (chronic obstructive pulmonary disease) ([place])   . Coronary atherosclerosis of native coronary artery 2005   s/p surgery  . Drug abuse ([place])    hx; tested for cocaine as recently as 2/08. says he is not using drugs now - avoided defib. for this reason   . ESRD (end stage renal disease) ([place])    Hemo M-W-F- Richarda Blade  . GERD (gastroesophageal reflux disease)    takes OTC meds as needed  . GI bleeding 02/06/2019  . Glaucoma    uses eye drops daily  . Hepatitis B 1968   "tx'd w/isolation; caught it from toilet stools in gym"  . History of blood transfusion 03/01/2019  . History of colon polyps    benign  . History of gout    takes Allopurinol daily as well as Colchicine-if needed (02/28/2018)  . History of kidney stones   . HTN (hypertension)    takes Coreg,Imdur.and Apresoline daily  . Human immunodeficiency virus (HIV) disease ([place]) dx'd 1995   takes Genvoya daily  . Hyperlipidemia    takes Atorvastatin daily  . Ischemic cardiomyopathy   . Muscle spasm    takes Zanaflex as needed  . Myocardial infarction ([place]) ~ 2004/2005  . Nocturia   . Peripheral neuropathy    takes gabapentin daily  . Pneumonia    "at least twice" (02/28/2018)    . Shortness of breath dyspnea    rarely but if notices it then with exertion  . Syphilis, unspecified   . Type II diabetes mellitus ([place]) 2004   Lantus daily.Average fasting blood sugar 125-199  . Wears glasses   . Wears partial dentures      Past Surgical History:  Procedure Laterality Date  . AV FISTULA PLACEMENT Left 08/02/2018   Procedure: ARTERIOVENOUS (AV) FISTULA CREATION  left arm radiocephlic;  Surgeon: Marty Heck, MD;  Location: [facility];  Service: Vascular;  Laterality: Left;  . AV FISTULA PLACEMENT Left 08/01/2019   Procedure: LEFT BRACHIOCEPHALIC ARTERIOVENOUS (AV) FISTULA CREATION;  Surgeon: [name], MD;  Location: [facility];  Service: Vascular;  Laterality: Left;  . BASCILIC VEIN TRANSPOSITION Left 10/03/2019   Procedure: BASILIC VEIN TRANSPOSITION LEFT SECOND STAGE;  Surgeon: [name], MD;  Location: [facility];  Service: Vascular;  Laterality: Left;  . CARDIAC CATHETERIZATION  10/2002; 12/19/2004   [name] 03/08/2011  . COLONOSCOPY  2013   Dr.John Henrene Pastor   . CORONARY ARTERY BYPASS GRAFT  02/24/2003   CABG X2/notes 03/08/2011  . CORONARY STENT INTERVENTION N/A 12/19/2019   Procedure: CORONARY STENT INTERVENTION;  Surgeon: Jettie Booze, MD;  Location: [facility];  Service: Cardiovascular;  Laterality: N/A;  . ESOPHAGOGASTRODUODENOSCOPY (EGD) WITH PROPOFOL N/A 02/08/2019   Procedure: ESOPHAGOGASTRODUODENOSCOPY (EGD) WITH PROPOFOL;  Surgeon: Milus Banister, MD;  Location: [facility];  Service: Gastroenterology;  Laterality: N/A;  . ESOPHAGOGASTRODUODENOSCOPY (EGD) WITH PROPOFOL N/A 12/22/2019   Procedure: ESOPHAGOGASTRODUODENOSCOPY (EGD) WITH PROPOFOL;  Surgeon: Lavena Bullion, DO;  Location: [facility];  Service: Gastroenterology;  Laterality: N/A;  . HEMOSTASIS CLIP PLACEMENT  12/22/2019   Procedure: HEMOSTASIS CLIP PLACEMENT;  Surgeon: Lavena Bullion, DO;  Location: [facility];  Service: Gastroenterology;;  . HOT HEMOSTASIS N/A 02/08/2019    Procedure: HOT HEMOSTASIS (ARGON PLASMA COAGULATION/BICAP);  Surgeon: Milus Banister, MD;  Location: [facility] ENDOSCOPY;  Service: Gastroenterology;  Laterality: N/A;  . HOT HEMOSTASIS N/A 12/22/2019   Procedure: HOT HEMOSTASIS (ARGON PLASMA COAGULATION/BICAP);  Surgeon: Lavena Bullion, DO;  Location[facility] ENDOSCOPY;  Service: Gastroenterology;  Laterality: N/A;  . INTERTROCHANTERIC HIP FRACTURE SURGERY Left 11/2006   [name] 03/08/2011  . INTRAVASCULAR ULTRASOUND/IVUS N/A 12/19/2019   Procedure: Intravascular Ultrasound/IVUS;  Surgeon: Jettie Booze, MD;  Location: [facility];  Service: Cardiovascular;  Laterality: N/A;  . IR FLUORO GUIDE CV LINE RIGHT  07/24/2019  . IR FLUORO GUIDE CV LINE RIGHT  07/30/2019  . IR US GUIDE VASC ACCESS RIGHT  07/24/2019  . IR US GUIDE VASC ACCESS RIGHT  07/30/2019  . LAPAROSCOPIC CHOLECYSTECTOMY  05/2006  . LIGATION OF COMPETING BRANCHES OF ARTERIOVENOUS FISTULA Left 11/05/2018   Procedure: LIGATION OF COMPETING BRANCHES OF ARTERIOVENOUS FISTULA  LEFT  ARM;  Surgeon: Marty Heck, MD;  Location: [facility];  Service: Vascular;  Laterality: Left;  . LUMBAR LAMINECTOMY/DECOMPRESSION MICRODISCECTOMY N/A 02/29/2016   Procedure: Left L4-5 Lateral Recess Decompression, Removal Extradural Intraspinal Facet Cyst;  Surgeon: Marybelle Killings, MD;  Location: [facility];  Service: Orthopedics;  Laterality: N/A;  . MULTIPLE TOOTH EXTRACTIONS    . ORIF MANDIBULAR FRACTURE Left 08/13/2004   ORIF of left body fracture mandible with KLS Martin 2.3-mm six hole/notes 03/08/2011  . RIGHT/LEFT HEART CATH AND CORONARY/GRAFT ANGIOGRAPHY N/A 12/19/2019   Procedure: RIGHT/LEFT HEART CATH AND CORONARY/GRAFT ANGIOGRAPHY;  Surgeon: Jettie Booze, MD;  Location: [facility];  Service: Cardiovascular;  Laterality: N/A;  . SCLEROTHERAPY  12/22/2019   Procedure: SCLEROTHERAPY;  Surgeon: Lavena Bullion, DO;  Location: [facility] ENDOSCOPY;  Service: Gastroenterology;;     Current  Outpatient Medications on File Prior to Visit  Medication Sig Dispense Refill  . iron sucrose in sodium chloride 0.9 % 100 mL Iron Sucrose (Venofer)    . iron sucrose in sodium chloride 0.9 % 100 mL Iron Sucrose (Venofer)    . Methoxy PEG-Epoetin Beta (MIRCERA IJ) Mircera    . acetaminophen (TYLENOL) 650 MG CR tablet Take 650 mg by mouth every 8 (eight) hours as needed for pain.    [name] allopurinol (ZYLOPRIM) 100 MG tablet TAKE 1 TABLET(100 MG) BY MOUTH DAILY 90 tablet 1  . ANORO ELLIPTA 62.5-25 MCG/INH AEPB INHALE 1 PUFF BY MOUTH EVERY DAY 60 each 5  . B-D UF III MINI PEN NEEDLES 31G X 5 MM MISC USE FOUR TIMES DAILY 100 each 11  . bictegravir-emtricitabine-tenofovir AF (BIKTARVY) 50-200-25 MG TABS tablet Take 1 tablet by mouth daily. 30 tablet 11  . carvedilol (COREG) 3.125 MG tablet Take 1 tablet (3.125 mg total) by mouth 2 (two) times daily. 180 tablet 3  . Choline Fenofibrate (FENOFIBRIC ACID) 135 MG CPDR TAKE 1 CAPSULE BY MOUTH DAILY 90 capsule 1  .  colchicine 0.6 MG tablet Take 0.6 mg by mouth daily as needed (pain).    [name] diclofenac Sodium (VOLTAREN) 1 % GEL Apply 2 g topically 3 (three) times daily as needed (pain). 540 g 1  . gabapentin (NEURONTIN) 300 MG capsule TAKE 1 CAPSULE BY MOUTH THREE TIMES DAILY (Patient taking differently: Take 300 mg by mouth 2 (two) times daily. ) 270 capsule 1  . insulin lispro (HUMALOG KWIKPEN) 100 UNIT/ML KwikPen Inject 0.19ml (20 Units total) into the skin 3 (three) times daily as needed for high blood sugar (above 150) 15 pen 1  . isosorbide mononitrate (IMDUR) 30 MG 24 hr tablet Take one tablet by mouth once daily 90 tablet 1  . Lancets (ONETOUCH DELICA PLUS [id number]) MISC Inject 1 Device as directed 3 (three) times daily. Dx: E11.40 300 each 3  . latanoprost (XALATAN) 0.005 % ophthalmic solution Place 1 drop into both eyes at bedtime.    . lidocaine-prilocaine (EMLA) cream SMARTSIG:Sparingly Topical    . multivitamin (RENA-VIT) TABS tablet Take 1 tablet by  mouth daily.    . mupirocin ointment (BACTROBAN) 2 % Apply to right foot and left hallux once daily. 30 g 1  . nitroGLYCERIN (NITROSTAT) 0.3 MG SL tablet ONE TABLET UNDER TONGUE AS NEEDED FOR CHEST PAIN 25 tablet 4  . ONETOUCH ULTRA test strip CHECK BLOOD SUGAR 3 TIMES  DAILY 300 strip 3  . pantoprazole (PROTONIX) 40 MG tablet TAKE 1 TABLET(40 MG) BY MOUTH DAILY 90 tablet 0  . sevelamer carbonate (RENVELA) 800 MG tablet Take 800 mg by mouth 3 (three) times daily with meals.    . simethicone (MYLICON) 80 MG chewable tablet Chew 1 tablet (80 mg total) by mouth 4 (four) times daily as needed for flatulence. 90 tablet 3  . ticagrelor (BRILINTA) 90 MG TABS tablet Take 1 tablet (90 mg total) by mouth 2 (two) times daily. 180 tablet 3  . timolol (BETIMOL) 0.5 % ophthalmic solution Apply to eye.    . timolol (TIMOPTIC) 0.5 % ophthalmic solution Place 1 drop into both eyes every morning.     No current facility-administered medications on file prior to visit.     Allergies  Allergen Reactions  . Augmentin [Amoxicillin-Pot Clavulanate] Diarrhea    Severe  . Amphetamines Other (See Comments)    Unknown reaction type     Family History  Problem Relation Age of Onset  . Heart failure Father   . Hypertension Father   . Diabetes Brother   . Heart attack Brother   . Alzheimer's disease Mother   . Stroke Sister   . Diabetes Sister   . Alzheimer's disease Sister   . Hypertension Brother   . Diabetes Brother   . Drug abuse Brother   . Colon cancer Neg Hx      Social History   Occupational History  . Occupation: retired  Tobacco Use  . Smoking status: Current Every Day Smoker    Packs/day: 0.50    Years: 43.00    Pack years: 21.50    Types: Cigarettes  . Smokeless tobacco: Never Used  Vaping Use  . Vaping Use: Never used  Substance and Sexual Activity  . Alcohol use: Yes    Alcohol/week: 12.0 standard drinks    Types: 12 Standard drinks or equivalent per week    Comment: occ  .  Drug use: No    Types: Cocaine    Comment: hx of crack/cocaine 55yrs ago 10/01/2019- none  . Sexual activity: Yes  Partners: Male     Immunization History  Administered Date(s) Administered  . Fluad Quad(high Dose 65+) 06/14/2019  . Hepatitis B 08/28/2006, 10/02/2007, 04/01/2008  . Hepatitis B, adult 06/03/2014, 07/04/2014  . Influenza Split 07/28/2011  . Influenza Whole 08/28/2006, 09/10/2007, 09/15/2008, 08/03/2009, 07/26/2010  . Influenza, High Dose Seasonal PF 07/04/2018  . Influenza,inj,Quad PF,6+ Mos 07/04/2014, 07/06/2015, 07/12/2016, 07/11/2017  . Influenza-Unspecified 06/24/2013, 06/30/2020  . PFIZER SARS-COV-2 Vaccination 11/16/2019, 12/07/2019  . Pneumococcal Conjugate-13 08/18/2016  . Pneumococcal Polysaccharide-23 08/28/2006, 07/28/2011, 05/30/2018  . Tdap 06/14/2019  . Zoster 06/03/2014  . Zoster Recombinat (Shingrix) 07/24/2017, 01/09/2018   Objective:  Physical Exam: There were no vitals filed for this visit.   CANAAN HOLZER is a pleasant 69 y.o. African American male in NAD. AAO x 3.  Vascular Examination: Capillary fill time to digits <3 seconds b/l lower extremities. Palpable pedal pulses b/l LE. Pedal hair absent. Lower extremity skin temperature gradient within normal limits. No pain with calf compression b/l.  Dermatological Examination: Pedal skin with normal turgor, texture and tone bilaterally. No open wounds bilaterally. No interdigital macerations bilaterally.   Left hallux has minimal subdermal hemorrhage. No erythema, no edema, no drainage, no fluctuance.  Right submet head 4 lesion has minimal subdermal hemorrhage. No erythema, no edema, no drainage, no fluctuance.  Musculoskeletal Examination: Normal muscle strength 5/5 to all lower extremity muscle groups bilaterally. No pain crepitus or joint limitation noted with ROM b/l. Hallux valgus with bunion deformity noted b/l lower extremities. Hammertoes noted to the 2-5  bilaterally.  Neurological Examination: Protective sensation diminished with 10g monofilament b/l.  Hemoglobin A1C 02/05/2020 11/09/2019  HGBA1C 4.4 5.0  Some recent data might be hidden    Lab Results  Component Value Date   WBC 7.5 06/30/2020   HGB 8.1 (L) 06/30/2020   HCT 24.8 (L) 06/30/2020   MCV 99.2 06/30/2020   PLT 150 06/30/2020     Assessment:   1. Pain due to onychomycosis of toenails of both feet   2. Pre-ulcerative calluses   3. ESRD on hemodialysis ([place])   4. Diabetes mellitus due to underlying condition with diabetic polyneuropathy, unspecified whether long term insulin use ([place])    Plan:  -Patient was evaluated and treated and all questions answered.  -Toenails 1-5 b/l debrided in length and girth without iatrogenic laceration. -Preulcerative calluses pared submetatarsal head(s) 4 right foot and left hallux utilizing sterile scalpel blade without incident. Today, his wounds remain closed. -Patient's  risk factors affecting recurrence of wound: diabetes, diabetic neuropathy, foot deformity, ESRD on hemodialysis, recurrent ulceration, need for offloading of lesions. -He did speak to Pedorthist on today's visit and the decision was made to order a new pair of shoes with custom insoles.  -We will follow him closely to prevent recurrence of ulceration until he can get his new diabetic shoes.  Return in about 6 weeks (around 08/25/2020) for check previous ulcer sites b/l feet.  Marzetta Board, DPM

## 2020-07-20 DIAGNOSIS — D509 Iron deficiency anemia, unspecified: Secondary | ICD-10-CM | POA: Diagnosis not present

## 2020-07-20 DIAGNOSIS — D631 Anemia in chronic kidney disease: Secondary | ICD-10-CM | POA: Diagnosis not present

## 2020-07-20 DIAGNOSIS — E1122 Type 2 diabetes mellitus with diabetic chronic kidney disease: Secondary | ICD-10-CM | POA: Diagnosis not present

## 2020-07-20 DIAGNOSIS — Z992 Dependence on renal dialysis: Secondary | ICD-10-CM | POA: Diagnosis not present

## 2020-07-20 DIAGNOSIS — N186 End stage renal disease: Secondary | ICD-10-CM | POA: Diagnosis not present

## 2020-07-20 DIAGNOSIS — N2581 Secondary hyperparathyroidism of renal origin: Secondary | ICD-10-CM | POA: Diagnosis not present

## 2020-07-22 DIAGNOSIS — Z992 Dependence on renal dialysis: Secondary | ICD-10-CM | POA: Diagnosis not present

## 2020-07-22 DIAGNOSIS — N186 End stage renal disease: Secondary | ICD-10-CM | POA: Diagnosis not present

## 2020-07-22 DIAGNOSIS — N2581 Secondary hyperparathyroidism of renal origin: Secondary | ICD-10-CM | POA: Diagnosis not present

## 2020-07-22 DIAGNOSIS — D509 Iron deficiency anemia, unspecified: Secondary | ICD-10-CM | POA: Diagnosis not present

## 2020-07-22 DIAGNOSIS — E1122 Type 2 diabetes mellitus with diabetic chronic kidney disease: Secondary | ICD-10-CM | POA: Diagnosis not present

## 2020-07-22 DIAGNOSIS — D631 Anemia in chronic kidney disease: Secondary | ICD-10-CM | POA: Diagnosis not present

## 2020-07-23 ENCOUNTER — Other Ambulatory Visit: Payer: Self-pay

## 2020-07-23 DIAGNOSIS — N186 End stage renal disease: Secondary | ICD-10-CM | POA: Diagnosis not present

## 2020-07-23 DIAGNOSIS — Z992 Dependence on renal dialysis: Secondary | ICD-10-CM | POA: Diagnosis not present

## 2020-07-23 DIAGNOSIS — I129 Hypertensive chronic kidney disease with stage 1 through stage 4 chronic kidney disease, or unspecified chronic kidney disease: Secondary | ICD-10-CM | POA: Diagnosis not present

## 2020-07-23 DIAGNOSIS — M109 Gout, unspecified: Secondary | ICD-10-CM

## 2020-07-23 MED ORDER — ALLOPURINOL 100 MG PO TABS: ORAL_TABLET | ORAL | 1 refills | Status: DC

## 2020-07-24 ENCOUNTER — Telehealth: Payer: Self-pay

## 2020-07-24 NOTE — Telephone Encounter (Signed)
Called patient regarding him not wanting to come in office for his appointment. Patient wants MOST form filled out,but doesn't want to come in office and says he doesn't have a smart phone to do video visit. Per [name] if patient does not have smart phone to do video visit then he needs to come in.

## 2020-07-27 DIAGNOSIS — D631 Anemia in chronic kidney disease: Secondary | ICD-10-CM | POA: Diagnosis not present

## 2020-07-27 DIAGNOSIS — Z992 Dependence on renal dialysis: Secondary | ICD-10-CM | POA: Diagnosis not present

## 2020-07-27 DIAGNOSIS — E1122 Type 2 diabetes mellitus with diabetic chronic kidney disease: Secondary | ICD-10-CM | POA: Diagnosis not present

## 2020-07-27 DIAGNOSIS — N186 End stage renal disease: Secondary | ICD-10-CM | POA: Diagnosis not present

## 2020-07-27 DIAGNOSIS — N2581 Secondary hyperparathyroidism of renal origin: Secondary | ICD-10-CM | POA: Diagnosis not present

## 2020-07-27 DIAGNOSIS — D509 Iron deficiency anemia, unspecified: Secondary | ICD-10-CM | POA: Diagnosis not present

## 2020-07-29 DIAGNOSIS — N186 End stage renal disease: Secondary | ICD-10-CM | POA: Diagnosis not present

## 2020-07-29 DIAGNOSIS — Z992 Dependence on renal dialysis: Secondary | ICD-10-CM | POA: Diagnosis not present

## 2020-07-29 DIAGNOSIS — D509 Iron deficiency anemia, unspecified: Secondary | ICD-10-CM | POA: Diagnosis not present

## 2020-07-29 DIAGNOSIS — E1122 Type 2 diabetes mellitus with diabetic chronic kidney disease: Secondary | ICD-10-CM | POA: Diagnosis not present

## 2020-07-29 DIAGNOSIS — N2581 Secondary hyperparathyroidism of renal origin: Secondary | ICD-10-CM | POA: Diagnosis not present

## 2020-07-29 DIAGNOSIS — D631 Anemia in chronic kidney disease: Secondary | ICD-10-CM | POA: Diagnosis not present

## 2020-07-30 ENCOUNTER — Ambulatory Visit: Payer: Medicare Other | Admitting: Family

## 2020-07-31 ENCOUNTER — Encounter: Payer: Self-pay | Admitting: Nurse Practitioner

## 2020-07-31 ENCOUNTER — Encounter: Payer: Medicare Other | Admitting: Nurse Practitioner

## 2020-07-31 ENCOUNTER — Other Ambulatory Visit: Payer: Self-pay

## 2020-07-31 ENCOUNTER — Ambulatory Visit: Payer: Medicare Other | Admitting: Family

## 2020-07-31 DIAGNOSIS — E1122 Type 2 diabetes mellitus with diabetic chronic kidney disease: Secondary | ICD-10-CM | POA: Diagnosis not present

## 2020-07-31 DIAGNOSIS — D631 Anemia in chronic kidney disease: Secondary | ICD-10-CM | POA: Diagnosis not present

## 2020-07-31 DIAGNOSIS — D509 Iron deficiency anemia, unspecified: Secondary | ICD-10-CM | POA: Diagnosis not present

## 2020-07-31 DIAGNOSIS — Z992 Dependence on renal dialysis: Secondary | ICD-10-CM | POA: Diagnosis not present

## 2020-07-31 DIAGNOSIS — N186 End stage renal disease: Secondary | ICD-10-CM | POA: Diagnosis not present

## 2020-07-31 DIAGNOSIS — N2581 Secondary hyperparathyroidism of renal origin: Secondary | ICD-10-CM | POA: Diagnosis not present

## 2020-07-31 NOTE — Progress Notes (Signed)
This encounter was created in error - please disregard.

## 2020-08-03 DIAGNOSIS — D631 Anemia in chronic kidney disease: Secondary | ICD-10-CM | POA: Diagnosis not present

## 2020-08-03 DIAGNOSIS — E1122 Type 2 diabetes mellitus with diabetic chronic kidney disease: Secondary | ICD-10-CM | POA: Diagnosis not present

## 2020-08-03 DIAGNOSIS — Z992 Dependence on renal dialysis: Secondary | ICD-10-CM | POA: Diagnosis not present

## 2020-08-03 DIAGNOSIS — D509 Iron deficiency anemia, unspecified: Secondary | ICD-10-CM | POA: Diagnosis not present

## 2020-08-03 DIAGNOSIS — N2581 Secondary hyperparathyroidism of renal origin: Secondary | ICD-10-CM | POA: Diagnosis not present

## 2020-08-03 DIAGNOSIS — N186 End stage renal disease: Secondary | ICD-10-CM | POA: Diagnosis not present

## 2020-08-03 NOTE — Progress Notes (Signed)
Date:  08/03/2020   ID:  Jacob [name], DOB 09/28/1951, MRN [id number]  PCP:  [name], NP  Cardiologist:  Dr. [name]    History of Present Illness:  69 y.o. with distant CABG in 2004 with EF improving from 24% to 45-50% after revascularization. Last myovue 2016 with scar no ischemia. Has done well with medical Rx and cath avoided due to CRF Now on dialysis  Followed by Dr [name] Had revision of left arm fistula January 2019  Comorbid conditions include HIV previous drug abuse HTN and HLD  Echo 02/07/19 EF 40-45% Severe LAE no significant valve disease  Myovue 12/13/18 abnormal EF 26% large inferior wall infarct   Cr 5.2 03/08/19  Hct 25.8 transfused 02/11/19 Had fistula revised with 2nd stage basilic vein transposition left arm Dr Donnetta Hutching 10/03/19   EGD AVM;s Rx did not have colonoscopy Iron Rx given   Still anemia with dyspnea Getting iron infusions.  BS poorly controlled eating too much bread and popsicles Also drinking a lot of wine A1c 9.2 Triglycerides 574  Called office with more edema and dyspnea 11/26/19  LE venous duplex 12/03/19 no DVT LE arterial duplex 12/05/19 no PVD  Been on dialysis since October 2020  M/WF still making urine weight is done a lot  Edema is worse on standing He indicates that Dr Dederding following his kidneys Now and did not want to add oral lasix   He has had negative COVID test December and has had vaccine   Right and left cath done by Dr Irish Lack 12/19/19  Had patent SVG to RCA distal LAD stenosis too small to intervene SVG RAMUS 95% with stenting  Circumflex occluded with left to left collaterals  RA 16 mmHg PA 70/28 mmHg PCWP 28 mmHg  TTE 02/06/19 EF 40-45%   Feels better post cath Not clear why Dr Dederding stopped his coreg needs to be back on beta blocker  Having hard time with dialysis Been on it a year and feels horrible Dr D has increased his dry weight Neuropathic pain in RLE  Past Medical History:  Diagnosis Date  .  Acute respiratory failure ([place]) 03/01/2018  . Arthritis    "all over; mostly knees and back" (02/28/2018)  . CHF (congestive heart failure) ([facility])    not on any meds  . Chronic lower back pain    stenosis  . Community acquired pneumonia 09/06/2013  . COPD (chronic obstructive pulmonary disease) ([place])   . Coronary atherosclerosis of native coronary artery 2005   s/p surgery  . Drug abuse ([place])    hx; tested for cocaine as recently as 2/08. says he is not using drugs now - avoided defib. for this reason   . ESRD (end stage renal disease) ([place])    Hemo M-W-F- Richarda Blade  . GERD (gastroesophageal reflux disease)    takes OTC meds as needed  . GI bleeding 02/06/2019  . Glaucoma    uses eye drops daily  . Hepatitis B 1968   "tx'd w/isolation; caught it from toilet stools in gym"  . History of blood transfusion 03/01/2019  . History of colon polyps    benign  . History of gout    takes Allopurinol daily as well as Colchicine-if needed (02/28/2018)  . History of kidney stones   . HTN (hypertension)    takes Coreg,Imdur.and Apresoline daily  . Human immunodeficiency virus (HIV) disease ([place]) dx'd 1995   takes Genvoya daily  . Hyperlipidemia  takes Atorvastatin daily  . Ischemic cardiomyopathy   . Muscle spasm    takes Zanaflex as needed  . Myocardial infarction ([place]) ~ 2004/2005  . Nocturia   . Peripheral neuropathy    takes gabapentin daily  . Pneumonia    "at least twice" (02/28/2018)  . Shortness of breath dyspnea    rarely but if notices it then with exertion  . Syphilis, unspecified   . Type II diabetes mellitus ([place]) 2004   Lantus daily.Average fasting blood sugar 125-199  . Wears glasses   . Wears partial dentures     Past Surgical History:  Procedure Laterality Date  . AV FISTULA PLACEMENT Left 08/02/2018   Procedure: ARTERIOVENOUS (AV) FISTULA CREATION  left arm radiocephlic;  Surgeon: Marty Heck, MD;  Location: [place];  Service: Vascular;  Laterality: Left;  .  AV FISTULA PLACEMENT Left 08/01/2019   Procedure: LEFT BRACHIOCEPHALIC ARTERIOVENOUS (AV) FISTULA CREATION;  Surgeon: [name], MD;  Location: [place];  Service: Vascular;  Laterality: Left;  . BASCILIC VEIN TRANSPOSITION Left 10/03/2019   Procedure: BASILIC VEIN TRANSPOSITION LEFT SECOND STAGE;  Surgeon: [name], MD;  Location: [place];  Service: Vascular;  Laterality: Left;  . CARDIAC CATHETERIZATION  10/2002; 12/19/2004   [name] 03/08/2011  . COLONOSCOPY  2013   Dr.John Henrene Pastor   . CORONARY ARTERY BYPASS GRAFT  02/24/2003   CABG X2/notes 03/08/2011  . CORONARY STENT INTERVENTION N/A 12/19/2019   Procedure: CORONARY STENT INTERVENTION;  Surgeon: Jettie Booze, MD;  Location: [place] [facility];  Service: Cardiovascular;  Laterality: N/A;  . ESOPHAGOGASTRODUODENOSCOPY (EGD) WITH PROPOFOL N/A 02/08/2019   Procedure: ESOPHAGOGASTRODUODENOSCOPY (EGD) WITH PROPOFOL;  Surgeon: Milus Banister, MD;  Location: [facility] [place];  Service: Gastroenterology;  Laterality: N/A;  . ESOPHAGOGASTRODUODENOSCOPY (EGD) WITH PROPOFOL N/A 12/22/2019   Procedure: ESOPHAGOGASTRODUODENOSCOPY (EGD) WITH PROPOFOL;  Surgeon: Lavena Bullion, DO;  Location: [place];  Service: Gastroenterology;  Laterality: N/A;  . HEMOSTASIS CLIP PLACEMENT  12/22/2019   Procedure: HEMOSTASIS CLIP PLACEMENT;  Surgeon: Lavena Bullion, DO;  Location: [place];  Service: Gastroenterology;;  . HOT HEMOSTASIS N/A 02/08/2019   Procedure: HOT HEMOSTASIS (ARGON PLASMA COAGULATION/BICAP);  Surgeon: Milus Banister, MD;  Location[facility] END[facility];  Service: Gastroenterology;  Laterality: N/A;  . HOT HEMOSTASIS N/A 12/22/2019   Procedure: HOT HEMOSTASIS (ARGON PLASMA COAGULATION/BICAP);  Surgeon: Lavena Bullion, DO;  Location: [facility] ENDOSCOPY;  Service: Gastroenterology;  Laterality: N/A;  . INTERTROCHANTERIC HIP FRACTURE SURGERY Left 11/2006   [name] 03/08/2011  . INTRAVASCULAR ULTRASOUND/IVUS N/A 12/19/2019   Procedure:  Intravascular Ultrasound/IVUS;  Surgeon: Jettie Booze, MD;  Location: [place] [facility];  Service: Cardiovascular;  Laterality: N/A;  . IR FLUORO GUIDE CV LINE RIGHT  07/24/2019  . IR FLUORO GUIDE CV LINE RIGHT  07/30/2019  . IR US GUIDE VASC ACCESS RIGHT  07/24/2019  . IR US GUIDE VASC ACCESS RIGHT  07/30/2019  . LAPAROSCOPIC CHOLECYSTECTOMY  05/2006  . LIGATION OF COMPETING BRANCHES OF ARTERIOVENOUS FISTULA Left 11/05/2018   Procedure: LIGATION OF COMPETING BRANCHES OF ARTERIOVENOUS FISTULA  LEFT  ARM;  Surgeon: Marty Heck, MD;  Location: [place];  Service: Vascular;  Laterality: Left;  . LUMBAR LAMINECTOMY/DECOMPRESSION MICRODISCECTOMY N/A 02/29/2016   Procedure: Left L4-5 Lateral Recess Decompression, Removal Extradural Intraspinal Facet Cyst;  Surgeon: Marybelle Killings, MD;  Location: [place];  Service: Orthopedics;  Laterality: N/A;  . MULTIPLE TOOTH EXTRACTIONS    . ORIF MANDIBULAR FRACTURE Left 08/13/2004  ORIF of left body fracture mandible with KLS Martin 2.3-mm six hole/notes 03/08/2011  . RIGHT/LEFT HEART CATH AND CORONARY/GRAFT ANGIOGRAPHY N/A 12/19/2019   Procedure: RIGHT/LEFT HEART CATH AND CORONARY/GRAFT ANGIOGRAPHY;  Surgeon: Jettie Booze, MD;  Location: [place] [facility];  Service: Cardiovascular;  Laterality: N/A;  . SCLEROTHERAPY  12/22/2019   Procedure: SCLEROTHERAPY;  Surgeon: Lavena Bullion, DO;  Location: [facility] ENDOSCOPY;  Service: Gastroenterology;;     Current Outpatient Medications  Medication Sig Dispense Refill  . acetaminophen (TYLENOL) 650 MG CR tablet Take 650 mg by mouth every 8 (eight) hours as needed for pain.    [name] allopurinol (ZYLOPRIM) 100 MG tablet TAKE 1 TABLET(100 MG) BY MOUTH DAILY 90 tablet 1  . ANORO ELLIPTA 62.5-25 MCG/INH AEPB INHALE 1 PUFF BY MOUTH EVERY DAY 60 each 5  . B-D UF III MINI PEN NEEDLES 31G X 5 MM MISC USE FOUR TIMES DAILY 100 each 11  . bictegravir-emtricitabine-tenofovir AF (BIKTARVY) 50-200-25 MG TABS tablet Take 1  tablet by mouth daily. 30 tablet 11  . carvedilol (COREG) 3.125 MG tablet Take 1 tablet (3.125 mg total) by mouth 2 (two) times daily. 180 tablet 3  . Choline Fenofibrate (FENOFIBRIC ACID) 135 MG CPDR TAKE 1 CAPSULE BY MOUTH DAILY 90 capsule 1  . colchicine 0.6 MG tablet Take 0.6 mg by mouth daily as needed (pain).    [name] diclofenac Sodium (VOLTAREN) 1 % GEL Apply 2 g topically 3 (three) times daily as needed (pain). 540 g 1  . gabapentin (NEURONTIN) 300 MG capsule TAKE 1 CAPSULE BY MOUTH THREE TIMES DAILY (Patient taking differently: Take 300 mg by mouth 2 (two) times daily. ) 270 capsule 1  . insulin lispro (HUMALOG KWIKPEN) 100 UNIT/ML KwikPen Inject 0.52ml (20 Units total) into the skin 3 (three) times daily as needed for high blood sugar (above 150) 15 pen 1  . iron sucrose in sodium chloride 0.9 % 100 mL Iron Sucrose (Venofer)    . iron sucrose in sodium chloride 0.9 % 100 mL Iron Sucrose (Venofer)    . isosorbide mononitrate (IMDUR) 30 MG 24 hr tablet Take one tablet by mouth once daily 90 tablet 1  . Lancets (ONETOUCH DELICA PLUS ZRAQTM22Q) MISC Inject 1 Device as directed 3 (three) times daily. Dx: E11.40 300 each 3  . latanoprost (XALATAN) 0.005 % ophthalmic solution Place 1 drop into both eyes at bedtime.    . lidocaine-prilocaine (EMLA) cream SMARTSIG:Sparingly Topical    . Methoxy PEG-Epoetin Beta (MIRCERA IJ) Mircera    . multivitamin (RENA-VIT) TABS tablet Take 1 tablet by mouth daily.    . mupirocin ointment (BACTROBAN) 2 % Apply to right foot and left hallux once daily. 30 g 1  . nitroGLYCERIN (NITROSTAT) 0.3 MG SL tablet ONE TABLET UNDER TONGUE AS NEEDED FOR CHEST PAIN 25 tablet 4  . ONETOUCH ULTRA test strip CHECK BLOOD SUGAR 3 TIMES  DAILY 300 strip 3  . pantoprazole (PROTONIX) 40 MG tablet TAKE 1 TABLET(40 MG) BY MOUTH DAILY 90 tablet 0  . sevelamer carbonate (RENVELA) 800 MG tablet Take 800 mg by mouth 3 (three) times daily with meals.    . simethicone (MYLICON) 80 MG chewable  tablet Chew 1 tablet (80 mg total) by mouth 4 (four) times daily as needed for flatulence. 90 tablet 3  . ticagrelor (BRILINTA) 90 MG TABS tablet Take 1 tablet (90 mg total) by mouth 2 (two) times daily. 180 tablet 3  . timolol (BETIMOL) 0.5 % ophthalmic solution Apply  to eye.    . timolol (TIMOPTIC) 0.5 % ophthalmic solution Place 1 drop into both eyes every morning.     No current facility-administered medications for this visit.    Allergies:   Augmentin [amoxicillin-pot clavulanate] and Amphetamines    Social History:  The patient  reports that he has been smoking cigarettes. He has a 21.50 pack-year smoking history. He has never used smokeless tobacco. He reports current alcohol use of about 12.0 standard drinks of alcohol per week. He reports that he does not use drugs.   Family History:  The patient's family history includes Alzheimer's disease in his mother and sister; Diabetes in his brother, brother, and sister; Drug abuse in his brother; Heart attack in his brother; Heart failure in his father; Hypertension in his brother and father; Stroke in his sister.    ROS:  General:no colds or fevers,  weight down 3 lbs by our scales but at home back to baseline Skin:no rashes or ulcers HEENT:no blurred vision, no congestion CV:see HPI PUL:see HPI GI:no diarrhea constipation or melena, no indigestion GU:no hematuria, no dysuria MS:no joint pain, no claudication Neuro:no syncope, no lightheadedness Endo:+ diabetes glucose stable, no thyroid disease  Wt Readings from Last 3 Encounters:  06/30/20 167 lb (75.8 kg)  06/16/20 176 lb (79.8 kg)  03/19/20 175 lb (79.4 kg)     PHYSICAL EXAM: VS:  There were no vitals taken for this visit. , BMI There is no height or weight on file to calculate BMI.   Affect appropriate Chronically ill male  HEENT: normal Neck supple with no adenopathy JVP normal no bruits no thyromegaly Lungs clear with no wheezing and good diaphragmatic  motion Heart:  S1/S2 no murmur, no rub, gallop or click PMI enlarged post sternotimy  Abdomen: benighn, BS positve, no tenderness, no AAA no bruit.  No HSM or HJR Left arm fistula with thrill  No edema Neuro non-focal Skin warm and dry No muscular weakness    EKG:   12/20/19 SR Rate 29 RBBB old IMI     Recent Labs: 02/05/2020: TSH 0.18 06/30/2020: ALT 10; BUN 89; Creat 7.36; Hemoglobin 8.1; Platelets 150; Potassium 5.0; Sodium 140    Lipid Panel    Component Value Date/Time   CHOL 65 12/21/2019 0352   CHOL 121 08/07/2015 1041   TRIG 95 12/21/2019 0352   HDL 17 (L) 12/21/2019 0352   HDL 32 (L) 08/07/2015 1041   CHOLHDL 3.8 12/21/2019 0352   VLDL 19 12/21/2019 0352   LDLCALC 29 12/21/2019 0352   LDLCALC  06/04/2019 1019     Comment:     . LDL cholesterol not calculated. Triglyceride levels greater than 400 mg/dL invalidate calculated LDL results. . Reference range: <100 . Desirable range <100 mg/dL for primary prevention;   <70 mg/dL for patients with CHD or diabetic patients  with > or = 2 CHD risk factors. [name] LDL-C is now calculated using the Martin-Hopkins  calculation, which is a validated novel method providing  better accuracy than the Friedewald equation in the  estimation of LDL-C.  Cresenciano Genre et al. Annamaria Helling. 2694;854(62): 2061-2068  (http://education.[employer].com/faq/FAQ164)        Other studies Reviewed: Additional studies/ records that were reviewed today include: recent OV note.  ECHO  02/07/19 IMPRESSIONS    1. The left ventricle has mild-moderately reduced systolic function, with  an ejection fraction of 40-45%. The cavity size was normal. Mild basal  septal hypertrophy. Left ventricular diastolic Doppler parameters are  consistent  with pseudonormalization.  Elevated left ventricular end-diastolic pressure Left ventricular diffuse  hypokinesis.  2. The right ventricle has normal systolic function. The cavity was  normal. There is no  increase in right ventricular wall thickness.  3. Left atrial size was severely dilated.  4. There was trivial MR, TR and PR.    Cath 12/19/19  Conclusion    Prox RCA lesion is 100% stenosed. SVG to RCA is widely patent.  Dist LAD lesion is 90% stenosed. Too small and distal for PCI.  3rd Diag lesion is 50% stenosed.  Prox Cx lesion is 100% stenosed. Left to left collaterals.  Ramus lesion is 100% stenosed.  SVG to ramus with Origin to Prox Graft lesion is 95% stenosed.  A drug-eluting stent was successfully placed using a SYNERGY XD [id number], postdilated to >4 mm. Optimized with IVUS.  Post intervention, there is a 0% residual stenosis.  There is moderate to severe left ventricular systolic dysfunction.  There is no aortic valve stenosis.  Hemodynamic findings consistent with moderate pulmonary hypertension.   Continue dual antiplatelet therapy for 6 months at least.  COnsider clopidogrel longer term if there is no interaction with his HIV meds, given the diffuse nature of CAD.      ASSESSMENT AND PLAN:  1.  Acute on chronic systolic and diastolic HF this is related to CRF  EF 40-45% echo April 2020  Fluid controlled with dialysis Continue nitrates and coreg Update echo post intervention for EF no AICD if > 35%   2. CAD: Cath 12/15/19 stent SVG Ramus patent SVG to RCA. Left to left collaterals to circumflex and diffuse distal LAD disease Imdur 30 mg daily added to medical Rx  Not on ACE/ARB due to CRF  Continue coreg   3. DM:  Discussed low carb diet.  Target hemoglobin A1c is 6.5 or less.  Continue current medications.  4. CRF:  On dialysis since October he indicates weight is down despite worse dyspnea and edema F/U renal ? Add oral lasix since not anuric   5. Cholesterol:  On statin labs with primary Triglycerides  up due to poor BS control   6. HTN:  Well controlled.  Continue current medications and low sodium Dash type diet.    7. Anemia per renal/GI   transfused 12/24/19 Hct 26.9 EGD 12/22/19 with angiodyplastic lesions in gastric fundus Rx with Argon laser Hct still low 24.8 06/30/20 PPI and iron Rx f/u Dr Henrene Pastor and hematology/ renal   8. HIV:  F/u Dr [name] [place]    F/U in 6 months   Jenkins Rouge, MD

## 2020-08-04 ENCOUNTER — Encounter: Payer: Self-pay | Admitting: Nurse Practitioner

## 2020-08-04 ENCOUNTER — Ambulatory Visit (INDEPENDENT_AMBULATORY_CARE_PROVIDER_SITE_OTHER): Payer: Medicare Other | Admitting: Nurse Practitioner

## 2020-08-04 ENCOUNTER — Other Ambulatory Visit: Payer: Self-pay

## 2020-08-04 DIAGNOSIS — Z992 Dependence on renal dialysis: Secondary | ICD-10-CM

## 2020-08-04 DIAGNOSIS — N186 End stage renal disease: Secondary | ICD-10-CM | POA: Diagnosis not present

## 2020-08-04 DIAGNOSIS — K31819 Angiodysplasia of stomach and duodenum without bleeding: Secondary | ICD-10-CM

## 2020-08-04 DIAGNOSIS — I10 Essential (primary) hypertension: Secondary | ICD-10-CM | POA: Diagnosis not present

## 2020-08-04 DIAGNOSIS — Z794 Long term (current) use of insulin: Secondary | ICD-10-CM

## 2020-08-04 DIAGNOSIS — E785 Hyperlipidemia, unspecified: Secondary | ICD-10-CM

## 2020-08-04 DIAGNOSIS — D638 Anemia in other chronic diseases classified elsewhere: Secondary | ICD-10-CM

## 2020-08-04 DIAGNOSIS — B20 Human immunodeficiency virus [HIV] disease: Secondary | ICD-10-CM

## 2020-08-04 DIAGNOSIS — E1140 Type 2 diabetes mellitus with diabetic neuropathy, unspecified: Secondary | ICD-10-CM | POA: Diagnosis not present

## 2020-08-04 DIAGNOSIS — E1169 Type 2 diabetes mellitus with other specified complication: Secondary | ICD-10-CM

## 2020-08-04 DIAGNOSIS — M109 Gout, unspecified: Secondary | ICD-10-CM

## 2020-08-04 DIAGNOSIS — J449 Chronic obstructive pulmonary disease, unspecified: Secondary | ICD-10-CM

## 2020-08-04 DIAGNOSIS — I25118 Atherosclerotic heart disease of native coronary artery with other forms of angina pectoris: Secondary | ICD-10-CM

## 2020-08-04 DIAGNOSIS — E1142 Type 2 diabetes mellitus with diabetic polyneuropathy: Secondary | ICD-10-CM

## 2020-08-04 NOTE — Progress Notes (Signed)
Careteam: Patient Care Team: [name], NP as PCP - General (Geriatric Medicine) [name], MD as PCP - Infectious Diseases (Infectious Diseases) [name], MD as PCP - Cardiology (Cardiology) [name], MD as Consulting Physician (Ophthalmology) [name], MD as Consulting Physician (Cardiology) [name], MD as Consulting Physician (Infectious Diseases) Gardiner Barefoot, DPM as Consulting Physician (Podiatry) [name], Nelda Bucks, NP as Nurse Practitioner (Family Medicine) [place], [name]  Advanced Directive information Does Patient Have a Medical Advance Directive?: Yes, Type of Advance Directive: [place], Does patient want to make changes to medical advance directive?: No - Patient declined  Allergies  Allergen Reactions   Augmentin [Amoxicillin-Pot Clavulanate] Diarrhea    Severe   Amphetamines Other (See Comments)    Unknown reaction type    Chief Complaint  Patient presents with   Medical Management of Chronic Issues    6 month follow up vist. Patient would like to discuss cramps from ankles to legs.Patietn using mustard for cramps, but they seems like they are going awasy. He has been drinking tonic water and vinegar water.   Quality Metric Gaps    Hemoglobin A1C.     HPI: Patient is a 69 y.o. male for routine follow up.   DM- a1c at goal in April at 4.4, reports blood sugars are "up and down" Using humalog 20 units TID for blood sugar over 150.  Yesterday blood sugar was 141 at HD  Neuropathy- nephrologist decreased to twice daily dosing.   GERD- controlled on protonix  ESRD- going to HD Monday, Wednesday and Friday  CAD- occasional chest pain (not new), seeing cardiologist on 10/21. Continues on coreg and imdur.  Gout- no flares on allopurinol 100 mg daily  COPD- continues to smoke. Reports he has attempted to cut back, continues on anoro daily. Does not require rescue inhaler.   Has leg cramps-  he was taking shots of liquor and fried chicken and some beef then last night had bad cramps. Has improved with vinegar water (guy at dialysis recommended it to him and said this helps)      Review of Systems:  Review of Systems  Constitutional: Negative for chills, fever and weight loss.  HENT: Negative for hearing loss and tinnitus.   Respiratory: Negative for cough, sputum production and shortness of breath.   Cardiovascular: Positive for chest pain. Negative for palpitations and leg swelling.  Gastrointestinal: Negative for abdominal pain, constipation, diarrhea and heartburn.  Genitourinary: Negative for dysuria, frequency and urgency.  Musculoskeletal: Positive for myalgias. Negative for back pain, falls and joint pain.  Skin: Negative.   Neurological: Negative for dizziness and headaches.  Psychiatric/Behavioral: Negative for depression and memory loss. The patient does not have insomnia.     Past Medical History:  Diagnosis Date   Acute respiratory failure ([place]) 03/01/2018   Arthritis    "all over; mostly knees and back" (02/28/2018)   CHF (congestive heart failure) (HCC)    not on any meds   Chronic lower back pain    stenosis   Community acquired pneumonia 09/06/2013   COPD (chronic obstructive pulmonary disease) ([place])    Coronary atherosclerosis of native coronary artery 2005   s/p surgery   Drug abuse ([place])    hx; tested for cocaine as recently as 2/08. says he is not using drugs now - avoided defib. for this reason    ESRD (end stage renal disease) ([place])    Hemo M-W-F- Richarda Blade  GERD (gastroesophageal reflux disease)    takes OTC meds as needed   GI bleeding 02/06/2019   Glaucoma    uses eye drops daily   Hepatitis B 1968   "tx'd w/isolation; caught it from toilet stools in gym"   History of blood transfusion 03/01/2019   History of colon polyps    benign   History of gout    takes Allopurinol daily as well as Colchicine-if needed (02/28/2018)    History of kidney stones    HTN (hypertension)    takes Coreg,Imdur.and Apresoline daily   Human immunodeficiency virus (HIV) disease ([place]) dx'd 1995   takes Genvoya daily   Hyperlipidemia    takes Atorvastatin daily   Ischemic cardiomyopathy    Muscle spasm    takes Zanaflex as needed   Myocardial infarction ([place]) ~ 2004/2005   Nocturia    Peripheral neuropathy    takes gabapentin daily   Pneumonia    "at least twice" (02/28/2018)   Shortness of breath dyspnea    rarely but if notices it then with exertion   Syphilis, unspecified    Type II diabetes mellitus ([place]) 2004   Lantus daily.Average fasting blood sugar 125-199   Wears glasses    Wears partial dentures    Past Surgical History:  Procedure Laterality Date   AV FISTULA PLACEMENT Left 08/02/2018   Procedure: ARTERIOVENOUS (AV) FISTULA CREATION  left arm radiocephlic;  Surgeon: Marty Heck, MD;  Location: [place];  Service: Vascular;  Laterality: Left;   AV FISTULA PLACEMENT Left 08/01/2019   Procedure: LEFT BRACHIOCEPHALIC ARTERIOVENOUS (AV) FISTULA CREATION;  Surgeon: [name], MD;  Location: [place];  Service: Vascular;  Laterality: Left;   [place] Left 10/03/2019   Procedure: BASILIC VEIN TRANSPOSITION LEFT SECOND STAGE;  Surgeon: [name], MD;  [facility];  Service: Vascular;  Laterality: Left;   CARDIAC CATHETERIZATION  10/2002; 12/19/2004   [name] 03/08/2011   COLONOSCOPY  2013   [place]    CORONARY ARTERY BYPASS GRAFT  02/24/2003   CABG X2/notes 03/08/2011   CORONARY STENT INTERVENTION N/A 12/19/2019   Procedure: CORONARY STENT INTERVENTION;  Surgeon: Jettie Booze, MD;  Location: [place] CV LAB;  Service: Cardiovascular;  Laterality: N/A;   ESOPHAGOGASTRODUODENOSCOPY (EGD) WITH PROPOFOL N/A 02/08/2019   Procedure: ESOPHAGOGASTRODUODENOSCOPY (EGD) WITH PROPOFOL;  Surgeon: Milus Banister, MD;  Location: [place];  Service:  Gastroenterology;  Laterality: N/A;   ESOPHAGOGASTRODUODENOSCOPY (EGD) WITH PROPOFOL N/A 12/22/2019   Procedure: ESOPHAGOGASTRODUODENOSCOPY (EGD) WITH PROPOFOL;  Surgeon: Lavena Bullion, DO;  Location: [place];  Service: Gastroenterology;  Laterality: N/A;   HEMOSTASIS CLIP PLACEMENT  12/22/2019   Procedure: HEMOSTASIS CLIP PLACEMENT;  Surgeon: Lavena Bullion, DO;  Location: [place];  Service: Gastroenterology;;   HOT HEMOSTASIS N/A 02/08/2019   Procedure: HOT HEMOSTASIS (ARGON PLASMA COAGULATION/BICAP);  Surgeon: Milus Banister, MD;  Location[facility] ENDOSCOPY;  Service: Gastroenterology;  Laterality: N/A;   HOT HEMOSTASIS N/A 12/22/2019   Procedure: HOT HEMOSTASIS (ARGON PLASMA COAGULATION/BICAP);  Surgeon: Lavena Bullion, DO;  Location[facility] ENDOSCOPY;  Service: Gastroenterology;  Laterality: N/A;   INTERTROCHANTERIC HIP FRACTURE SURGERY Left 11/2006   [name] 03/08/2011   INTRAVASCULAR ULTRASOUND/IVUS N/A 12/19/2019   Procedure: Intravascular Ultrasound/IVUS;  Surgeon: Jettie Booze, MD;  Location: [place] [facility] LAB;  Service: Cardiovascular;  Laterality: N/A;   IR FLUORO GUIDE CV LINE RIGHT  07/24/2019   IR FLUORO GUIDE CV LINE RIGHT  07/30/2019  IR US GUIDE VASC ACCESS RIGHT  07/24/2019   IR US GUIDE VASC ACCESS RIGHT  07/30/2019   LAPAROSCOPIC CHOLECYSTECTOMY  05/2006   LIGATION OF COMPETING BRANCHES OF ARTERIOVENOUS FISTULA Left 11/05/2018   Procedure: LIGATION OF COMPETING BRANCHES OF ARTERIOVENOUS FISTULA  LEFT  ARM;  Surgeon: Marty Heck, MD;  Location: [facility] OR;  Service: Vascular;  Laterality: Left;   LUMBAR LAMINECTOMY/DECOMPRESSION MICRODISCECTOMY N/A 02/29/2016   Procedure: Left L4-5 Lateral Recess Decompression, Removal Extradural Intraspinal Facet Cyst;  Surgeon: Marybelle Killings, MD;  Location: [place];  Service: Orthopedics;  Laterality: N/A;   MULTIPLE TOOTH EXTRACTIONS     ORIF MANDIBULAR FRACTURE Left 08/13/2004   ORIF of left body fracture  mandible with KLS Martin 2.3-mm six hole/notes 03/08/2011   RIGHT/LEFT HEART CATH AND CORONARY/GRAFT ANGIOGRAPHY N/A 12/19/2019   Procedure: RIGHT/LEFT HEART CATH AND CORONARY/GRAFT ANGIOGRAPHY;  Surgeon: Jettie Booze, MD;  Location: [place] CV LAB;  Service: Cardiovascular;  Laterality: N/A;   SCLEROTHERAPY  12/22/2019   Procedure: SCLEROTHERAPY;  Surgeon: Lavena Bullion, DO;  Location: [facility] ENDOSCOPY;  Service: Gastroenterology;;   Social History:   reports that he has been smoking cigarettes. He has a 21.50 pack-year smoking history. He has never used smokeless tobacco. He reports current alcohol use of about 12.0 standard drinks of alcohol per week. He reports that he does not use drugs.  Family History  Problem Relation Age of Onset   Heart failure Father    Hypertension Father    Diabetes Brother    Heart attack Brother    Alzheimer's disease Mother    Stroke Sister    Diabetes Sister    Alzheimer's disease Sister    Hypertension Brother    Diabetes Brother    Drug abuse Brother    Colon cancer Neg Hx     Medications: Patient's Medications  New Prescriptions   No medications on file  Previous Medications   ACETAMINOPHEN (TYLENOL) 650 MG CR TABLET    Take 650 mg by mouth every 8 (eight) hours as needed for pain.   ALLOPURINOL (ZYLOPRIM) 100 MG TABLET    TAKE 1 TABLET(100 MG) BY MOUTH DAILY   ANORO ELLIPTA 62.5-25 MCG/INH AEPB    INHALE 1 PUFF BY MOUTH EVERY DAY   B-D UF III MINI PEN NEEDLES 31G X 5 MM MISC    USE FOUR TIMES DAILY   BICTEGRAVIR-EMTRICITABINE-TENOFOVIR AF (BIKTARVY) 50-200-25 MG TABS TABLET    Take 1 tablet by mouth daily.   CARVEDILOL (COREG) 3.125 MG TABLET    Take 1 tablet (3.125 mg total) by mouth 2 (two) times daily.   COLCHICINE 0.6 MG TABLET    Take 0.6 mg by mouth daily as needed (pain).   DICLOFENAC SODIUM (VOLTAREN) 1 % GEL    Apply 2 g topically 3 (three) times daily as needed (pain).   GABAPENTIN (NEURONTIN) 300 MG CAPSULE     TAKE 1 CAPSULE BY MOUTH THREE TIMES DAILY   INSULIN LISPRO (HUMALOG KWIKPEN) 100 UNIT/ML KWIKPEN    Inject 0.30ml (20 Units total) into the skin 3 (three) times daily as needed for high blood sugar (above 150)   IRON SUCROSE IN SODIUM CHLORIDE 0.9 % 100 ML    Iron Sucrose (Venofer)   ISOSORBIDE MONONITRATE (IMDUR) 30 MG 24 HR TABLET    Take one tablet by mouth once daily   LANCETS (ONETOUCH DELICA PLUS [id number]) MISC    Inject 1 Device as directed 3 (three) times daily. Dx: E11.40  LATANOPROST (XALATAN) 0.005 % OPHTHALMIC SOLUTION    Place 1 drop into both eyes at bedtime.   METHOXY PEG-EPOETIN BETA (MIRCERA IJ)    Mircera   MULTIVITAMIN (RENA-VIT) TABS TABLET    Take 1 tablet by mouth daily.   MUPIROCIN OINTMENT (BACTROBAN) 2 %    Apply to right foot and left hallux once daily.   NITROGLYCERIN (NITROSTAT) 0.3 MG SL TABLET    ONE TABLET UNDER TONGUE AS NEEDED FOR CHEST PAIN   ONETOUCH ULTRA TEST STRIP    CHECK BLOOD SUGAR 3 TIMES  DAILY   PANTOPRAZOLE (PROTONIX) 40 MG TABLET    TAKE 1 TABLET(40 MG) BY MOUTH DAILY   SEVELAMER CARBONATE (RENVELA) 800 MG TABLET    Take 800 mg by mouth 3 (three) times daily with meals.   SIMETHICONE (MYLICON) 80 MG CHEWABLE TABLET    Chew 1 tablet (80 mg total) by mouth 4 (four) times daily as needed for flatulence.   TICAGRELOR (BRILINTA) 90 MG TABS TABLET    Take 1 tablet (90 mg total) by mouth 2 (two) times daily.   TIMOLOL (BETIMOL) 0.5 % OPHTHALMIC SOLUTION    Apply to eye.   TIMOLOL (TIMOPTIC) 0.5 % OPHTHALMIC SOLUTION    Place 1 drop into both eyes every morning.  Modified Medications   No medications on file  Discontinued Medications   ATORVASTATIN (LIPITOR) 80 MG TABLET    Take 80 mg by mouth daily.   CHOLINE FENOFIBRATE (FENOFIBRIC ACID) 135 MG CPDR    TAKE 1 CAPSULE BY MOUTH DAILY   LIDOCAINE-PRILOCAINE (EMLA) CREAM    SMARTSIG:Sparingly Topical   METHOXY PEG-EPOETIN BETA (MIRCERA IJ)    Mircera    Physical Exam:  There were no vitals filed for  this visit. There is no height or weight on file to calculate BMI. Wt Readings from Last 3 Encounters:  06/30/20 167 lb (75.8 kg)  06/16/20 176 lb (79.8 kg)  03/19/20 175 lb (79.4 kg)      Labs reviewed: Basic Metabolic Panel: Recent Labs    12/23/19 0832 12/23/19 0832 02/05/20 0000 03/11/20 1730 06/30/20 1049  NA 136  --  141 134* 140  K 3.8   < > 4.1 3.1* 5.0  CL 101   < > 103 93* 98  CO2 22  --   --  27 29  GLUCOSE 199*  --   --  168* 165*  BUN 80*  --  65* 32* 89*  CREATININE 7.28*  --  5.6* 3.15* 7.36*  CALCIUM 8.6*   < > 9.1 8.0* 8.7  PHOS 5.1*  --   --   --   --   TSH  --   --  0.18*  --   --    < > = values in this interval not displayed.   Liver Function Tests: Recent Labs    10/21/19 1035 10/21/19 1035 11/09/19 0000 12/23/19 0832 02/05/20 0000 06/30/20 1049  AST 177*  --   --   --   --  15  ALT 214*  --   --   --   --  10  ALKPHOS 86  --  74  --  90  --   BILITOT 1.0  --   --   --   --  0.5  PROT 7.4  --   --   --   --  6.6  ALBUMIN 3.3*   < > 5.0 2.5* 3.6  --    < > =  values in this interval not displayed.   No results for input(s): LIPASE, AMYLASE in the last 8760 hours. No results for input(s): AMMONIA in the last 8760 hours. CBC: Recent Labs    12/17/19 1042 12/19/19 0547 03/11/20 1730 03/19/20 1336 06/30/20 1049  WBC 4.5   < > 4.0 4.7 7.5  NEUTROABS 2.8  --  2.4 2,496  --   HGB 8.4*   < > 7.0* 8.2* 8.1*  HCT 26.0*   < > 23.0* 26.6* 24.8*  MCV 81   < > 99.1 100.0 99.2  PLT 145*   < > 200 222 150   < > = values in this interval not displayed.   Lipid Panel: Recent Labs    12/21/19 0352  CHOL 65  HDL 17*  LDLCALC 29  TRIG 95  CHOLHDL 3.8   TSH: Recent Labs    02/05/20 0000  TSH 0.18*   A1C: Lab Results  Component Value Date   HGBA1C 4.4 02/05/2020     Assessment/Plan 1. Essential hypertension Pt reports good control when bp taken at dialysis, did not have a bp reading for today's visit.   2. Type 2 diabetes  mellitus with diabetic neuropathy, with long-term current use of insulin ([place]) -continues on humalog with meals. Denies low blood sugars.  -will have dialysis add on A1c and fax to office.  -Encouraged dietary compliance, routine foot care/monitoring and to keep up with diabetic eye exams through ophthalmology   3. Anemia of chronic disease -followed by dialysis. Continues on iron supplement.   4. ESRD (end stage renal disease) on dialysis ([place]) Continues on HD Monday, Wednesday and Friday.  5. Gout, unspecified cause, unspecified chronicity, unspecified site No recent flares, continues on allopurinol.   6. Chronic obstructive pulmonary disease, unspecified COPD type ([place]) -stable continues on anoro daily  7. Type 2 diabetes mellitus with diabetic polyneuropathy, with long-term current use of insulin (H[facility]) Neuropathy stable at this time.   8. AVM (arteriovenous malformation) of duodenum, acquired Stable, no signs of blood loss at this time. Continues on protonix  9. Hyperlipidemia associated with type 2 diabetes mellitus ([place]) -stable at this time. Encouraged dietary modifications.   10. Coronary artery disease of native artery of native heart with stable angina pectoris ([place]) Continues to have occasional chest pain followed by cardiology. Continues on brilinta and imdur  11. Human immunodeficiency virus (HIV) disease ([place]) Followed by [facility] on biktarvy     Next appt: 6 months for Routine follow up. Sooner if needed  [employer]. Harle Battiest  [facility] [phone]    Virtual Visit via telephone, unable to connect virtually.  I connected with patient on 08/04/20 at 10:00 AM EDT by telephone and verified that I am speaking with the correct person using two identifiers.  Location: Patient: home Provider: [facility]   I discussed the limitations, risks, security and privacy concerns of performing an evaluation and management service by  telephone and the availability of in person appointments. I also discussed with the patient that there may be a patient responsible charge related to this service. The patient expressed understanding and agreed to proceed.   I discussed the assessment and treatment plan with the patient. The patient was provided an opportunity to ask questions and all were answered. The patient agreed with the plan and demonstrated an understanding of the instructions.   The patient was advised to call back or seek an in-person evaluation if the symptoms worsen  or if the condition fails to improve as anticipated.  I provided 22 minutes of non-face-to-face time during this encounter.  [name]. Harle Battiest Avs printed and mailed

## 2020-08-04 NOTE — Progress Notes (Signed)
This service is provided via telemedicine  No vital signs collected/recorded due to the encounter was a telemedicine visit.   Location of patient (ex: home, work):  Home  Patient consents to a telephone visit:  Yes, see encounter dated 07/07/2020  Location of the provider (ex: office, home):  [place]  Name of any referring provider:  N/A  Names of all persons participating in the telemedicine service and their role in the encounter:  Sherrie Mustache, Nurse Practitioner, Carroll Kinds, CMA, and patient.   Time spent on call:  7 minutes with medical assistant

## 2020-08-05 DIAGNOSIS — D509 Iron deficiency anemia, unspecified: Secondary | ICD-10-CM | POA: Diagnosis not present

## 2020-08-05 DIAGNOSIS — N2581 Secondary hyperparathyroidism of renal origin: Secondary | ICD-10-CM | POA: Diagnosis not present

## 2020-08-05 DIAGNOSIS — E1122 Type 2 diabetes mellitus with diabetic chronic kidney disease: Secondary | ICD-10-CM | POA: Diagnosis not present

## 2020-08-05 DIAGNOSIS — D631 Anemia in chronic kidney disease: Secondary | ICD-10-CM | POA: Diagnosis not present

## 2020-08-05 DIAGNOSIS — N186 End stage renal disease: Secondary | ICD-10-CM | POA: Diagnosis not present

## 2020-08-05 DIAGNOSIS — Z992 Dependence on renal dialysis: Secondary | ICD-10-CM | POA: Diagnosis not present

## 2020-08-07 DIAGNOSIS — Z992 Dependence on renal dialysis: Secondary | ICD-10-CM | POA: Diagnosis not present

## 2020-08-07 DIAGNOSIS — D509 Iron deficiency anemia, unspecified: Secondary | ICD-10-CM | POA: Diagnosis not present

## 2020-08-07 DIAGNOSIS — E1122 Type 2 diabetes mellitus with diabetic chronic kidney disease: Secondary | ICD-10-CM | POA: Diagnosis not present

## 2020-08-07 DIAGNOSIS — D631 Anemia in chronic kidney disease: Secondary | ICD-10-CM | POA: Diagnosis not present

## 2020-08-07 DIAGNOSIS — N186 End stage renal disease: Secondary | ICD-10-CM | POA: Diagnosis not present

## 2020-08-07 DIAGNOSIS — N2581 Secondary hyperparathyroidism of renal origin: Secondary | ICD-10-CM | POA: Diagnosis not present

## 2020-08-08 ENCOUNTER — Other Ambulatory Visit: Payer: Self-pay | Admitting: Family

## 2020-08-10 ENCOUNTER — Other Ambulatory Visit: Payer: Self-pay | Admitting: Nurse Practitioner

## 2020-08-10 DIAGNOSIS — E1122 Type 2 diabetes mellitus with diabetic chronic kidney disease: Secondary | ICD-10-CM | POA: Diagnosis not present

## 2020-08-10 DIAGNOSIS — I5042 Chronic combined systolic (congestive) and diastolic (congestive) heart failure: Secondary | ICD-10-CM

## 2020-08-10 DIAGNOSIS — Z992 Dependence on renal dialysis: Secondary | ICD-10-CM | POA: Diagnosis not present

## 2020-08-10 DIAGNOSIS — N2581 Secondary hyperparathyroidism of renal origin: Secondary | ICD-10-CM | POA: Diagnosis not present

## 2020-08-10 DIAGNOSIS — N186 End stage renal disease: Secondary | ICD-10-CM | POA: Diagnosis not present

## 2020-08-10 DIAGNOSIS — D631 Anemia in chronic kidney disease: Secondary | ICD-10-CM | POA: Diagnosis not present

## 2020-08-10 DIAGNOSIS — D509 Iron deficiency anemia, unspecified: Secondary | ICD-10-CM | POA: Diagnosis not present

## 2020-08-11 ENCOUNTER — Telehealth: Payer: Self-pay

## 2020-08-11 NOTE — Telephone Encounter (Signed)
Thank you. Patient made aware to reach out to nephrologist.

## 2020-08-11 NOTE — Telephone Encounter (Signed)
Received call from patient asking if his medication [facility]) can cause him to have pain in his feet. Patient states he notices the pain after Dialysis. When asked if patient has discussed his pain with his PCP he declined and is for sure that it's coming from his [place]. Routing to MD and Pharmacy for advise.

## 2020-08-11 NOTE — Telephone Encounter (Signed)
No, Biktarvy should not be causing this. It is probably something related to dialysis and he should discuss with his nephrologist.

## 2020-08-12 DIAGNOSIS — N186 End stage renal disease: Secondary | ICD-10-CM | POA: Diagnosis not present

## 2020-08-12 DIAGNOSIS — N2581 Secondary hyperparathyroidism of renal origin: Secondary | ICD-10-CM | POA: Diagnosis not present

## 2020-08-12 DIAGNOSIS — D631 Anemia in chronic kidney disease: Secondary | ICD-10-CM | POA: Diagnosis not present

## 2020-08-12 DIAGNOSIS — E1122 Type 2 diabetes mellitus with diabetic chronic kidney disease: Secondary | ICD-10-CM | POA: Diagnosis not present

## 2020-08-12 DIAGNOSIS — D509 Iron deficiency anemia, unspecified: Secondary | ICD-10-CM | POA: Diagnosis not present

## 2020-08-12 DIAGNOSIS — Z992 Dependence on renal dialysis: Secondary | ICD-10-CM | POA: Diagnosis not present

## 2020-08-13 ENCOUNTER — Ambulatory Visit (INDEPENDENT_AMBULATORY_CARE_PROVIDER_SITE_OTHER): Payer: Medicare Other | Admitting: Cardiovascular Disease

## 2020-08-13 ENCOUNTER — Encounter: Payer: Self-pay | Admitting: Cardiovascular Disease

## 2020-08-13 ENCOUNTER — Other Ambulatory Visit: Payer: Self-pay

## 2020-08-13 VITALS — BP 108/62 | HR 86 | Ht 74.0 in | Wt 179.4 lb

## 2020-08-13 DIAGNOSIS — I2510 Atherosclerotic heart disease of native coronary artery without angina pectoris: Secondary | ICD-10-CM | POA: Diagnosis not present

## 2020-08-13 NOTE — Patient Instructions (Signed)
Medication Instructions:  °*If you need a refill on your cardiac medications before your next appointment, please call your pharmacy* ° °Lab Work: °If you have labs (blood work) drawn today and your tests are completely normal, you will receive your results only by: °• MyChart Message (if you have MyChart) OR °• A paper copy in the mail °If you have any lab test that is abnormal or we need to change your treatment, we will call you to review the results. ° °Testing/Procedures: °None ordered today. ° ° °Follow-Up: °At [facility], you and your health needs are our priority.  As part of our continuing mission to provide you with exceptional heart care, we have created designated Provider Care Teams.  These Care Teams include your primary Cardiologist (physician) and Advanced Practice Providers (APPs -  Physician Assistants and Nurse Practitioners) who all work together to provide you with the care you need, when you need it. ° °We recommend signing up for the patient portal called "MyChart".  Sign up information is provided on this After Visit Summary.  MyChart is used to connect with patients for Virtual Visits (Telemedicine).  Patients are able to view lab/test results, encounter notes, upcoming appointments, etc.  Non-urgent messages can be sent to your provider as well.   °To learn more about what you can do with MyChart, go to [email or web address].   ° °Your next appointment:   °6 month(s) ° °The format for your next appointment:   °In Person ° °Provider:   °You may see Peter Nishan, MD or one of the following Advanced Practice Providers on your designated Care Team:   °· Lori Gerhardt, NP °· Laura Ingold, NP °· Jill McDaniel, NP ° ° ° °

## 2020-08-14 DIAGNOSIS — N2581 Secondary hyperparathyroidism of renal origin: Secondary | ICD-10-CM | POA: Diagnosis not present

## 2020-08-14 DIAGNOSIS — N186 End stage renal disease: Secondary | ICD-10-CM | POA: Diagnosis not present

## 2020-08-14 DIAGNOSIS — D509 Iron deficiency anemia, unspecified: Secondary | ICD-10-CM | POA: Diagnosis not present

## 2020-08-14 DIAGNOSIS — E1122 Type 2 diabetes mellitus with diabetic chronic kidney disease: Secondary | ICD-10-CM | POA: Diagnosis not present

## 2020-08-14 DIAGNOSIS — D631 Anemia in chronic kidney disease: Secondary | ICD-10-CM | POA: Diagnosis not present

## 2020-08-14 DIAGNOSIS — Z992 Dependence on renal dialysis: Secondary | ICD-10-CM | POA: Diagnosis not present

## 2020-08-15 ENCOUNTER — Other Ambulatory Visit: Payer: Self-pay | Admitting: Nurse Practitioner

## 2020-08-17 DIAGNOSIS — N2581 Secondary hyperparathyroidism of renal origin: Secondary | ICD-10-CM | POA: Diagnosis not present

## 2020-08-17 DIAGNOSIS — N186 End stage renal disease: Secondary | ICD-10-CM | POA: Diagnosis not present

## 2020-08-17 DIAGNOSIS — E1122 Type 2 diabetes mellitus with diabetic chronic kidney disease: Secondary | ICD-10-CM | POA: Diagnosis not present

## 2020-08-17 DIAGNOSIS — Z992 Dependence on renal dialysis: Secondary | ICD-10-CM | POA: Diagnosis not present

## 2020-08-17 DIAGNOSIS — D509 Iron deficiency anemia, unspecified: Secondary | ICD-10-CM | POA: Diagnosis not present

## 2020-08-17 DIAGNOSIS — D631 Anemia in chronic kidney disease: Secondary | ICD-10-CM | POA: Diagnosis not present

## 2020-08-19 DIAGNOSIS — D631 Anemia in chronic kidney disease: Secondary | ICD-10-CM | POA: Diagnosis not present

## 2020-08-19 DIAGNOSIS — E1122 Type 2 diabetes mellitus with diabetic chronic kidney disease: Secondary | ICD-10-CM | POA: Diagnosis not present

## 2020-08-19 DIAGNOSIS — D509 Iron deficiency anemia, unspecified: Secondary | ICD-10-CM | POA: Diagnosis not present

## 2020-08-19 DIAGNOSIS — Z992 Dependence on renal dialysis: Secondary | ICD-10-CM | POA: Diagnosis not present

## 2020-08-19 DIAGNOSIS — N186 End stage renal disease: Secondary | ICD-10-CM | POA: Diagnosis not present

## 2020-08-19 DIAGNOSIS — N2581 Secondary hyperparathyroidism of renal origin: Secondary | ICD-10-CM | POA: Diagnosis not present

## 2020-08-20 ENCOUNTER — Telehealth: Payer: Self-pay | Admitting: *Deleted

## 2020-08-20 NOTE — Telephone Encounter (Signed)
Would need an in office visit for evaluation of pain.

## 2020-08-20 NOTE — Telephone Encounter (Signed)
Patient notified and agreed. Appointment scheduled with [facility] 11/16

## 2020-08-20 NOTE — Telephone Encounter (Signed)
Patient called and wanted to know if he could be prescribed a pain patch.  Stated that the Gabapentin is not working for the Neuropathy. Stated that it is getting worse in his right leg and has trouble walking, especially when he comes back from dialysis  Please Advise.

## 2020-08-21 DIAGNOSIS — D631 Anemia in chronic kidney disease: Secondary | ICD-10-CM | POA: Diagnosis not present

## 2020-08-21 DIAGNOSIS — D509 Iron deficiency anemia, unspecified: Secondary | ICD-10-CM | POA: Diagnosis not present

## 2020-08-21 DIAGNOSIS — E1122 Type 2 diabetes mellitus with diabetic chronic kidney disease: Secondary | ICD-10-CM | POA: Diagnosis not present

## 2020-08-21 DIAGNOSIS — Z992 Dependence on renal dialysis: Secondary | ICD-10-CM | POA: Diagnosis not present

## 2020-08-21 DIAGNOSIS — N186 End stage renal disease: Secondary | ICD-10-CM | POA: Diagnosis not present

## 2020-08-21 DIAGNOSIS — N2581 Secondary hyperparathyroidism of renal origin: Secondary | ICD-10-CM | POA: Diagnosis not present

## 2020-08-23 DIAGNOSIS — I129 Hypertensive chronic kidney disease with stage 1 through stage 4 chronic kidney disease, or unspecified chronic kidney disease: Secondary | ICD-10-CM | POA: Diagnosis not present

## 2020-08-23 DIAGNOSIS — N186 End stage renal disease: Secondary | ICD-10-CM | POA: Diagnosis not present

## 2020-08-23 DIAGNOSIS — Z992 Dependence on renal dialysis: Secondary | ICD-10-CM | POA: Diagnosis not present

## 2020-08-24 DIAGNOSIS — E1122 Type 2 diabetes mellitus with diabetic chronic kidney disease: Secondary | ICD-10-CM | POA: Diagnosis not present

## 2020-08-24 DIAGNOSIS — Z992 Dependence on renal dialysis: Secondary | ICD-10-CM | POA: Diagnosis not present

## 2020-08-24 DIAGNOSIS — N2581 Secondary hyperparathyroidism of renal origin: Secondary | ICD-10-CM | POA: Diagnosis not present

## 2020-08-24 DIAGNOSIS — D509 Iron deficiency anemia, unspecified: Secondary | ICD-10-CM | POA: Diagnosis not present

## 2020-08-24 DIAGNOSIS — N186 End stage renal disease: Secondary | ICD-10-CM | POA: Diagnosis not present

## 2020-08-24 DIAGNOSIS — D631 Anemia in chronic kidney disease: Secondary | ICD-10-CM | POA: Diagnosis not present

## 2020-08-25 ENCOUNTER — Encounter: Payer: Self-pay | Admitting: Podiatry

## 2020-08-25 ENCOUNTER — Other Ambulatory Visit: Payer: Self-pay

## 2020-08-25 ENCOUNTER — Ambulatory Visit (INDEPENDENT_AMBULATORY_CARE_PROVIDER_SITE_OTHER): Payer: Medicare Other | Admitting: Podiatry

## 2020-08-25 DIAGNOSIS — E0842 Diabetes mellitus due to underlying condition with diabetic polyneuropathy: Secondary | ICD-10-CM

## 2020-08-25 DIAGNOSIS — L84 Corns and callosities: Secondary | ICD-10-CM | POA: Diagnosis not present

## 2020-08-25 DIAGNOSIS — N186 End stage renal disease: Secondary | ICD-10-CM

## 2020-08-25 DIAGNOSIS — Z992 Dependence on renal dialysis: Secondary | ICD-10-CM

## 2020-08-26 DIAGNOSIS — D509 Iron deficiency anemia, unspecified: Secondary | ICD-10-CM | POA: Diagnosis not present

## 2020-08-26 DIAGNOSIS — E1122 Type 2 diabetes mellitus with diabetic chronic kidney disease: Secondary | ICD-10-CM | POA: Diagnosis not present

## 2020-08-26 DIAGNOSIS — N186 End stage renal disease: Secondary | ICD-10-CM | POA: Diagnosis not present

## 2020-08-26 DIAGNOSIS — N2581 Secondary hyperparathyroidism of renal origin: Secondary | ICD-10-CM | POA: Diagnosis not present

## 2020-08-26 DIAGNOSIS — Z992 Dependence on renal dialysis: Secondary | ICD-10-CM | POA: Diagnosis not present

## 2020-08-26 DIAGNOSIS — D631 Anemia in chronic kidney disease: Secondary | ICD-10-CM | POA: Diagnosis not present

## 2020-08-28 DIAGNOSIS — E1122 Type 2 diabetes mellitus with diabetic chronic kidney disease: Secondary | ICD-10-CM | POA: Diagnosis not present

## 2020-08-28 DIAGNOSIS — N2581 Secondary hyperparathyroidism of renal origin: Secondary | ICD-10-CM | POA: Diagnosis not present

## 2020-08-28 DIAGNOSIS — D509 Iron deficiency anemia, unspecified: Secondary | ICD-10-CM | POA: Diagnosis not present

## 2020-08-28 DIAGNOSIS — D631 Anemia in chronic kidney disease: Secondary | ICD-10-CM | POA: Diagnosis not present

## 2020-08-28 DIAGNOSIS — N186 End stage renal disease: Secondary | ICD-10-CM | POA: Diagnosis not present

## 2020-08-28 DIAGNOSIS — Z992 Dependence on renal dialysis: Secondary | ICD-10-CM | POA: Diagnosis not present

## 2020-08-29 ENCOUNTER — Encounter: Payer: Self-pay | Admitting: Podiatry

## 2020-08-29 NOTE — Progress Notes (Signed)
Subjective:  [name], male    DOB: Jul 15, 1951,  MRN: [id number]  69 y.o. male is seen for follow up with h/o chroic ulcers  L hallux and submet head 4 right foot.  Risk factors include NIDDM with ESRD on hemodialysis (MWF),  h/o diabetic foot ulcers.  He prefers to wear Croc type shoes.   Patient relates no new problems on today's visit.  He notices no blood/drainage in his socks. No foot odor. The patient is having no constitutional symptoms, denying fever, chills, anorexia, or weight loss.  Past Medical History:  Diagnosis Date  . Acute respiratory failure ([place]) 03/01/2018  . Arthritis    "all over; mostly knees and back" (02/28/2018)  . CHF (congestive heart failure) (H[facility])    not on any meds  . Chronic lower back pain    stenosis  . Community acquired pneumonia 09/06/2013  . COPD (chronic obstructive pulmonary disease) ([place])   . Coronary atherosclerosis of native coronary artery 2005   s/p surgery  . Drug abuse ([place])    hx; tested for cocaine as recently as 2/08. says he is not using drugs now - avoided defib. for this reason   . ESRD (end stage renal disease) ([place])    Hemo M-W-F- Richarda Blade  . GERD (gastroesophageal reflux disease)    takes OTC meds as needed  . GI bleeding 02/06/2019  . Glaucoma    uses eye drops daily  . Hepatitis B 1968   "tx'd w/isolation; caught it from toilet stools in gym"  . History of blood transfusion 03/01/2019  . History of colon polyps    benign  . History of gout    takes Allopurinol daily as well as Colchicine-if needed (02/28/2018)  . History of kidney stones   . HTN (hypertension)    takes Coreg,Imdur.and Apresoline daily  . Human immunodeficiency virus (HIV) disease ([place]) dx'd 1995   takes Genvoya daily  . Hyperlipidemia    takes Atorvastatin daily  . Ischemic cardiomyopathy   . Muscle spasm    takes Zanaflex as needed  . Myocardial infarction ([place]) ~ 2004/2005  . Nocturia   . Peripheral neuropathy    takes  gabapentin daily  . Pneumonia    "at least twice" (02/28/2018)  . Shortness of breath dyspnea    rarely but if notices it then with exertion  . Syphilis, unspecified   . Type II diabetes mellitus ([place]) 2004   Lantus daily.Average fasting blood sugar 125-199  . Wears glasses   . Wears partial dentures      Past Surgical History:  Procedure Laterality Date  . AV FISTULA PLACEMENT Left 08/02/2018   Procedure: ARTERIOVENOUS (AV) FISTULA CREATION  left arm radiocephlic;  Surgeon: Marty Heck, MD;  Location: [place];  Service: Vascular;  Laterality: Left;  . AV FISTULA PLACEMENT Left 08/01/2019   Procedure: LEFT BRACHIOCEPHALIC ARTERIOVENOUS (AV) FISTULA CREATION;  Surgeon: [name], MD;  Location: [place];  Service: Vascular;  Laterality: Left;  . BASCILIC VEIN TRANSPOSITION Left 10/03/2019   Procedure: BASILIC VEIN TRANSPOSITION LEFT SECOND STAGE;  Surgeon: [name], MD;  Location: [place];  Service: Vascular;  Laterality: Left;  . CARDIAC CATHETERIZATION  10/2002; 12/19/2004   [name] 03/08/2011  . COLONOSCOPY  2013   Dr.John Henrene Pastor   . CORONARY ARTERY BYPASS GRAFT  02/24/2003   CABG X2/notes 03/08/2011  . CORONARY STENT INTERVENTION N/A 12/19/2019   Procedure: CORONARY STENT INTERVENTION;  Surgeon: Jettie Booze, MD;  Location: [place] [facility];  Service: Cardiovascular;  Laterality: N/A;  . ESOPHAGOGASTRODUODENOSCOPY (EGD) WITH PROPOFOL N/A 02/08/2019   Procedure: ESOPHAGOGASTRODUODENOSCOPY (EGD) WITH PROPOFOL;  Surgeon: Milus Banister, MD;  Location: [place];  Service: Gastroenterology;  Laterality: N/A;  . ESOPHAGOGASTRODUODENOSCOPY (EGD) WITH PROPOFOL N/A 12/22/2019   Procedure: ESOPHAGOGASTRODUODENOSCOPY (EGD) WITH PROPOFOL;  Surgeon: Lavena Bullion, DO;  Location: [place];  Service: Gastroenterology;  Laterality: N/A;  . HEMOSTASIS CLIP PLACEMENT  12/22/2019   Procedure: HEMOSTASIS CLIP PLACEMENT;  Surgeon: Lavena Bullion, DO;  Location: [place];  Service: Gastroenterology;;  . HOT HEMOSTASIS N/A 02/08/2019   Procedure: HOT HEMOSTASIS (ARGON PLASMA COAGULATION/BICAP);  Surgeon: Milus Banister, MD;  Location: [facility] ENDOSCOPY;  Service: Gastroenterology;  Laterality: N/A;  . HOT HEMOSTASIS N/A 12/22/2019   Procedure: HOT HEMOSTASIS (ARGON PLASMA COAGULATION/BICAP);  Surgeon: Lavena Bullion, DO;  Location: [facility] ENDOSCOPY;  Service: Gastroenterology;  Laterality: N/A;  . INTERTROCHANTERIC HIP FRACTURE SURGERY Left 11/2006   [name] 03/08/2011  . INTRAVASCULAR ULTRASOUND/IVUS N/A 12/19/2019   Procedure: Intravascular Ultrasound/IVUS;  Surgeon: Jettie Booze, MD;  Location: [place] CV [facility];  Service: Cardiovascular;  Laterality: N/A;  . IR FLUORO GUIDE CV LINE RIGHT  07/24/2019  . IR FLUORO GUIDE CV LINE RIGHT  07/30/2019  . IR US GUIDE VASC ACCESS RIGHT  07/24/2019  . IR US GUIDE VASC ACCESS RIGHT  07/30/2019  . LAPAROSCOPIC CHOLECYSTECTOMY  05/2006  . LIGATION OF COMPETING BRANCHES OF ARTERIOVENOUS FISTULA Left 11/05/2018   Procedure: LIGATION OF COMPETING BRANCHES OF ARTERIOVENOUS FISTULA  LEFT  ARM;  Surgeon: Marty Heck, MD;  Location: [place];  Service: Vascular;  Laterality: Left;  . LUMBAR LAMINECTOMY/DECOMPRESSION MICRODISCECTOMY N/A 02/29/2016   Procedure: Left L4-5 Lateral Recess Decompression, Removal Extradural Intraspinal Facet Cyst;  Surgeon: Marybelle Killings, MD;  Location: [place];  Service: Orthopedics;  Laterality: N/A;  . MULTIPLE TOOTH EXTRACTIONS    . ORIF MANDIBULAR FRACTURE Left 08/13/2004   ORIF of left body fracture mandible with KLS Martin 2.3-mm six hole/notes 03/08/2011  . RIGHT/LEFT HEART CATH AND CORONARY/GRAFT ANGIOGRAPHY N/A 12/19/2019   Procedure: RIGHT/LEFT HEART CATH AND CORONARY/GRAFT ANGIOGRAPHY;  Surgeon: Jettie Booze, MD;  Location: [place] [facility];  Service: Cardiovascular;  Laterality: N/A;  . SCLEROTHERAPY  12/22/2019   Procedure: SCLEROTHERAPY;  Surgeon: Lavena Bullion, DO;   Location: [facility] ENDOSCOPY;  Service: Gastroenterology;;     Current Outpatient Medications on File Prior to Visit  Medication Sig Dispense Refill  . acetaminophen (TYLENOL) 650 MG CR tablet Take 650 mg by mouth every 8 (eight) hours as needed for pain.    [name] allopurinol (ZYLOPRIM) 100 MG tablet TAKE 1 TABLET(100 MG) BY MOUTH DAILY 90 tablet 1  . ANORO ELLIPTA 62.5-25 MCG/INH AEPB INHALE 1 PUFF BY MOUTH EVERY DAY 60 each 5  . B-D UF III MINI PEN NEEDLES 31G X 5 MM MISC USE FOUR TIMES DAILY 100 each 11  . bictegravir-emtricitabine-tenofovir AF (BIKTARVY) 50-200-25 MG TABS tablet Take 1 tablet by mouth daily. 30 tablet 11  . carvedilol (COREG) 3.125 MG tablet Take 1 tablet (3.125 mg total) by mouth 2 (two) times daily. 180 tablet 3  . Cinacalcet HCl (SENSIPAR PO) Take by mouth.    . colchicine 0.6 MG tablet Take 0.6 mg by mouth daily as needed (pain).    [name] diclofenac Sodium (VOLTAREN) 1 % GEL APPLY 2 GRAMS TOPICALLY TO THE AFFECTED AREA THREE TIMES DAILY AS NEEDED FOR PAIN 600 g  0  . gabapentin (NEURONTIN) 300 MG capsule Take 300 mg by mouth 2 (two) times daily.    . insulin lispro (HUMALOG KWIKPEN) 100 UNIT/ML KwikPen Inject 0.81ml (20 Units total) into the skin 3 (three) times daily as needed for high blood sugar (above 150) 15 pen 1  . iron sucrose in sodium chloride 0.9 % 100 mL Iron Sucrose (Venofer)    . iron sucrose in sodium chloride 0.9 % 100 mL Iron Sucrose (Venofer)    . isosorbide mononitrate (IMDUR) 30 MG 24 hr tablet TAKE 1 TABLET BY MOUTH EVERY DAY 90 tablet 1  . Lancets (ONETOUCH DELICA PLUS [id number]) MISC Inject 1 Device as directed 3 (three) times daily. Dx: E11.40 300 each 3  . latanoprost (XALATAN) 0.005 % ophthalmic solution Place 1 drop into both eyes at bedtime.    . Methoxy PEG-Epoetin Beta (MIRCERA IJ) Mircera    . multivitamin (RENA-VIT) TABS tablet Take 1 tablet by mouth daily.    . mupirocin ointment (BACTROBAN) 2 % Apply to right foot and left hallux once daily. 30 g 1   . nitroGLYCERIN (NITROSTAT) 0.3 MG SL tablet ONE TABLET UNDER TONGUE AS NEEDED FOR CHEST PAIN 25 tablet 4  . Nutritional Supplements (VITAMIN D BOOSTER PO) Take by mouth.    Glory Rosebush ULTRA test strip CHECK BLOOD SUGAR 3 TIMES  DAILY 300 strip 3  . pantoprazole (PROTONIX) 40 MG tablet TAKE 1 TABLET(40 MG) BY MOUTH DAILY 90 tablet 0  . sevelamer carbonate (RENVELA) 800 MG tablet Take 800 mg by mouth 3 (three) times daily with meals.    . simethicone (MYLICON) 80 MG chewable tablet Chew 1 tablet (80 mg total) by mouth 4 (four) times daily as needed for flatulence. 90 tablet 3  . ticagrelor (BRILINTA) 90 MG TABS tablet Take 1 tablet (90 mg total) by mouth 2 (two) times daily. 180 tablet 3  . timolol (BETIMOL) 0.5 % ophthalmic solution Apply to eye.    . timolol (TIMOPTIC) 0.5 % ophthalmic solution Place 1 drop into both eyes every morning.     No current facility-administered medications on file prior to visit.     Allergies  Allergen Reactions  . Augmentin [Amoxicillin-Pot Clavulanate] Diarrhea    Severe  . Amphetamines Other (See Comments)    Unknown reaction type     Family History  Problem Relation Age of Onset  . Heart failure Father   . Hypertension Father   . Diabetes Brother   . Heart attack Brother   . Alzheimer's disease Mother   . Stroke Sister   . Diabetes Sister   . Alzheimer's disease Sister   . Hypertension Brother   . Diabetes Brother   . Drug abuse Brother   . Colon cancer Neg Hx      Social History   Occupational History  . Occupation: retired  Tobacco Use  . Smoking status: Current Every Day Smoker    Packs/day: 0.50    Years: 43.00    Pack years: 21.50    Types: Cigarettes  . Smokeless tobacco: Never Used  Vaping Use  . Vaping Use: Never used  Substance and Sexual Activity  . Alcohol use: Yes    Alcohol/week: 12.0 standard drinks    Types: 12 Standard drinks or equivalent per week    Comment: occ  . Drug use: No    Types: Cocaine     Comment: hx of crack/cocaine 62yrs ago 10/01/2019- none  . Sexual activity: Yes    Partners:  Male     Immunization History  Administered Date(s) Administered  . Fluad Quad(high Dose 65+) 06/14/2019  . Hepatitis B 08/28/2006, 10/02/2007, 04/01/2008  . Hepatitis B, adult 06/03/2014, 07/04/2014  . Influenza Split 07/28/2011  . Influenza Whole 08/28/2006, 09/10/2007, 09/15/2008, 08/03/2009, 07/26/2010  . Influenza, High Dose Seasonal PF 07/04/2018  . Influenza,inj,Quad PF,6+ Mos 07/04/2014, 07/06/2015, 07/12/2016, 07/11/2017  . Influenza-Unspecified 06/24/2013, 06/30/2020  . PFIZER SARS-COV-2 Vaccination 11/16/2019, 12/07/2019  . Pneumococcal Conjugate-13 08/18/2016  . Pneumococcal Polysaccharide-23 08/28/2006, 07/28/2011, 05/30/2018  . Tdap 06/14/2019  . Zoster 06/03/2014  . Zoster Recombinat (Shingrix) 07/24/2017, 01/09/2018   Objective:  Physical Exam: There were no vitals filed for this visit.   Jacob [name] is a pleasant 69 y.o. African American male in NAD. AAO x 3.  Vascular Examination: Capillary fill time to digits <3 seconds b/l lower extremities. Palpable pedal pulses b/l LE. Pedal hair absent. Lower extremity skin temperature gradient within normal limits. No pain with calf compression b/l.  Dermatological Examination: Pedal skin with normal turgor, texture and tone bilaterally. No open wounds bilaterally. No interdigital macerations bilaterally.   Left hallux has minimal subdermal hemorrhage, partial thickness and measures 1.5 x 1.0 cm, predebridement. Postdebridement, measaures 1.5 x 1.0 x 0.1 cm.. No erythema, no edema, no drainage, no fluctuance. No signs of infection noted.  Right submet head 4 lesion 1.0 x 1.0 cm predebridement. Postdebridement,  1.0 x 1.0 x 0.1 cm  has minimal subdermal hemorrhage, partial thickness. No erythema, no edema, no drainage, no fluctuance. No signs of infection noted.  Musculoskeletal Examination: Normal muscle strength 5/5 to all  lower extremity muscle groups bilaterally. No pain crepitus or joint limitation noted with ROM b/l. Hallux valgus with bunion deformity noted b/l lower extremities. Hammertoes noted to the 2-5 bilaterally.  Neurological Examination: Protective sensation diminished with 10g monofilament b/l.  Hemoglobin A1C 02/05/2020 11/09/2019  HGBA1C 4.4 5.0  Some recent data might be hidden    Lab Results  Component Value Date   WBC 7.5 06/30/2020   HGB 8.1 (L) 06/30/2020   HCT 24.8 (L) 06/30/2020   MCV 99.2 06/30/2020   PLT 150 06/30/2020     Assessment:   1. Pre-ulcerative calluses   2. Diabetes mellitus due to underlying condition with diabetic polyneuropathy, unspecified whether long term insulin use ([place])   3. ESRD on hemodialysis [facility])    Plan:  -Patient was evaluated and treated and all questions answered.  -Partial thickness ulcers pared submetatarsal head(s) 4 right foot 1.0 x 1.0 x 0.1 cm and left hallux 1.5 x 1.0 x 0.1 cm utilizing sterile scalpel blade without incident. Today, his wounds remain partial thickness with no signs of infection. -Patient's  risk factors affecting recurrence of wound: diabetes, diabetic neuropathy, foot deformity, ESRD on hemodialysis, recurrent ulceration, need for offloading of lesions. -We will start process for new diabetic shoes/inserts in January per Pedorthist..  -We will follow him closely to prevent recurrence of ulceration until he can get his new diabetic shoes.  Return in about 4 weeks (around 09/22/2020) for toenail debridement w/corn(s)/callus(es).  Marzetta Board, DPM

## 2020-08-31 DIAGNOSIS — D509 Iron deficiency anemia, unspecified: Secondary | ICD-10-CM | POA: Diagnosis not present

## 2020-08-31 DIAGNOSIS — E1122 Type 2 diabetes mellitus with diabetic chronic kidney disease: Secondary | ICD-10-CM | POA: Diagnosis not present

## 2020-08-31 DIAGNOSIS — Z992 Dependence on renal dialysis: Secondary | ICD-10-CM | POA: Diagnosis not present

## 2020-08-31 DIAGNOSIS — D631 Anemia in chronic kidney disease: Secondary | ICD-10-CM | POA: Diagnosis not present

## 2020-08-31 DIAGNOSIS — N2581 Secondary hyperparathyroidism of renal origin: Secondary | ICD-10-CM | POA: Diagnosis not present

## 2020-08-31 DIAGNOSIS — N186 End stage renal disease: Secondary | ICD-10-CM | POA: Diagnosis not present

## 2020-09-02 DIAGNOSIS — N2581 Secondary hyperparathyroidism of renal origin: Secondary | ICD-10-CM | POA: Diagnosis not present

## 2020-09-02 DIAGNOSIS — E1122 Type 2 diabetes mellitus with diabetic chronic kidney disease: Secondary | ICD-10-CM | POA: Diagnosis not present

## 2020-09-02 DIAGNOSIS — D631 Anemia in chronic kidney disease: Secondary | ICD-10-CM | POA: Diagnosis not present

## 2020-09-02 DIAGNOSIS — Z992 Dependence on renal dialysis: Secondary | ICD-10-CM | POA: Diagnosis not present

## 2020-09-02 DIAGNOSIS — N186 End stage renal disease: Secondary | ICD-10-CM | POA: Diagnosis not present

## 2020-09-02 DIAGNOSIS — D509 Iron deficiency anemia, unspecified: Secondary | ICD-10-CM | POA: Diagnosis not present

## 2020-09-04 DIAGNOSIS — D509 Iron deficiency anemia, unspecified: Secondary | ICD-10-CM | POA: Diagnosis not present

## 2020-09-04 DIAGNOSIS — N2581 Secondary hyperparathyroidism of renal origin: Secondary | ICD-10-CM | POA: Diagnosis not present

## 2020-09-04 DIAGNOSIS — Z992 Dependence on renal dialysis: Secondary | ICD-10-CM | POA: Diagnosis not present

## 2020-09-04 DIAGNOSIS — D631 Anemia in chronic kidney disease: Secondary | ICD-10-CM | POA: Diagnosis not present

## 2020-09-04 DIAGNOSIS — E1122 Type 2 diabetes mellitus with diabetic chronic kidney disease: Secondary | ICD-10-CM | POA: Diagnosis not present

## 2020-09-04 DIAGNOSIS — N186 End stage renal disease: Secondary | ICD-10-CM | POA: Diagnosis not present

## 2020-09-07 DIAGNOSIS — D631 Anemia in chronic kidney disease: Secondary | ICD-10-CM | POA: Diagnosis not present

## 2020-09-07 DIAGNOSIS — N186 End stage renal disease: Secondary | ICD-10-CM | POA: Diagnosis not present

## 2020-09-07 DIAGNOSIS — N2581 Secondary hyperparathyroidism of renal origin: Secondary | ICD-10-CM | POA: Diagnosis not present

## 2020-09-07 DIAGNOSIS — D509 Iron deficiency anemia, unspecified: Secondary | ICD-10-CM | POA: Diagnosis not present

## 2020-09-07 DIAGNOSIS — E1122 Type 2 diabetes mellitus with diabetic chronic kidney disease: Secondary | ICD-10-CM | POA: Diagnosis not present

## 2020-09-07 DIAGNOSIS — Z992 Dependence on renal dialysis: Secondary | ICD-10-CM | POA: Diagnosis not present

## 2020-09-08 ENCOUNTER — Other Ambulatory Visit: Payer: Self-pay

## 2020-09-08 ENCOUNTER — Encounter: Payer: Self-pay | Admitting: Family

## 2020-09-08 ENCOUNTER — Ambulatory Visit (INDEPENDENT_AMBULATORY_CARE_PROVIDER_SITE_OTHER): Payer: Medicare Other | Admitting: Family

## 2020-09-08 ENCOUNTER — Ambulatory Visit
Admission: RE | Admit: 2020-09-08 | Discharge: 2020-09-08 | Disposition: A | Discharge status: Home / Self Care | Payer: Medicare Other | Source: Ambulatory Visit | Attending: Family | Admitting: Family

## 2020-09-08 VITALS — BP 100/60 | HR 82 | Temp 97.7°F | Resp 16 | Ht 74.0 in | Wt 181.8 lb

## 2020-09-08 DIAGNOSIS — M79671 Pain in right foot: Secondary | ICD-10-CM | POA: Diagnosis not present

## 2020-09-08 DIAGNOSIS — M25551 Pain in right hip: Secondary | ICD-10-CM

## 2020-09-08 DIAGNOSIS — W19XXXA Unspecified fall, initial encounter: Secondary | ICD-10-CM | POA: Diagnosis not present

## 2020-09-08 DIAGNOSIS — M5441 Lumbago with sciatica, right side: Secondary | ICD-10-CM

## 2020-09-08 DIAGNOSIS — S92351A Displaced fracture of fifth metatarsal bone, right foot, initial encounter for closed fracture: Secondary | ICD-10-CM | POA: Diagnosis not present

## 2020-09-08 DIAGNOSIS — M545 Low back pain, unspecified: Secondary | ICD-10-CM | POA: Diagnosis not present

## 2020-09-08 MED ORDER — PREDNISONE 10 MG PO TABS: ORAL_TABLET | ORAL | 0 refills | Status: DC

## 2020-09-08 NOTE — Progress Notes (Signed)
Provider: Jahseh Lucchese FNP-C  [name], NP  Patient Care Team: [name], NP as PCP - General (Geriatric Medicine) [name], MD as PCP - Infectious Diseases (Infectious Diseases) [name], MD as PCP - Cardiology (Cardiology) [name], MD as Consulting Physician (Ophthalmology) [name], MD as Consulting Physician (Cardiology) [name], MD as Consulting Physician (Infectious Diseases) Gardiner Barefoot, DPM as Consulting Physician (Podiatry) Franchot Pollitt, Nelda Bucks, NP as Nurse Practitioner (Family Medicine) [place], [name]  Extended Emergency Contact Information Primary Emergency Contact: Bertrum Sol Address: [address]          [place], [place] [address] [place] Phone: [phone] Mobile Phone: [phone] Relation: Friend Secondary Emergency Contact: Alda Berthold [place] Phone: [phone] Relation: Sister  Code Status: Full Code  Goals of care: Advanced Directive information Advanced Directives 09/08/2020  Does Patient Have a Medical Advance Directive? Yes  Type of Paramedic of [place];Living will;Out of facility DNR (pink MOST or yellow form)  Does patient want to make changes to medical advance directive? No - Patient declined  Copy of [place] in Chart? Yes - validated most recent copy scanned in chart (See row information)  Would patient like information on creating a medical advance directive? -     Chief Complaint  Patient presents with   Acute Visit    Neuropathy pain in right lower back shooting through right leg. Patient is requesting pain patch.    HPI:  Pt is a 69 y.o. male seen today for an acute visit for evaluation of right lower back down to right leg.He states fell off the chair trying to fix a light ball.He states he hit the the TV next to the chair. He didn't hit his head.No loss of bowel control or  bladder. Numbness and tingling on the right right more than usual.Pain worst with sitting,walking and lying during dialysis.He took oxycodone from his brother which helped with the pain.he has also used tylenol,Voltaren gel and heating pad which eased off the pain.Has also used CBD oil.  He states no loss of consciousness.he was abe to get up by himself but pain was worst with walking.He didn't go to the ED or urgent care when he fell three weeks ago.    Past Medical History:  Diagnosis Date   Acute respiratory failure ([place]) 03/01/2018   Arthritis    "all over; mostly knees and back" (02/28/2018)   CHF (congestive heart failure) ([facility])    not on any meds   Chronic lower back pain    stenosis   Community acquired pneumonia 09/06/2013   COPD (chronic obstructive pulmonary disease) ([facility])    Coronary atherosclerosis of native coronary artery 2005   s/p surgery   Drug abuse ([facility])    hx; tested for cocaine as recently as 2/08. says he is not using drugs now - avoided defib. for this reason    ESRD (end stage renal disease) ([facility] [facility])    Hemo M-W-F- Richarda Blade   GERD (gastroesophageal reflux disease)    takes OTC meds as needed   GI bleeding 02/06/2019   Glaucoma    uses eye drops daily   Hepatitis B 1968   "tx'd w/isolation; caught it from toilet stools in gym"   History of blood transfusion 03/01/2019   History of colon polyps    benign   History of gout    takes Allopurinol daily as well as Colchicine-if needed (02/28/2018)  History of kidney stones    HTN (hypertension)    takes Coreg,Imdur.and Apresoline daily   Human immunodeficiency virus (HIV) disease ([facility]) dx'd 1995   takes Genvoya daily   Hyperlipidemia    takes Atorvastatin daily   Ischemic cardiomyopathy    Muscle spasm    takes Zanaflex as needed   Myocardial infarction ([facility]) ~ 2004/2005   Nocturia    Peripheral neuropathy    takes gabapentin daily   Pneumonia    "at least twice" (02/28/2018)    Shortness of breath dyspnea    rarely but if notices it then with exertion   Syphilis, unspecified    Type II diabetes mellitus ([facility]) 2004   Lantus daily.Average fasting blood sugar 125-199   Wears glasses    Wears partial dentures    Past Surgical History:  Procedure Laterality Date   AV FISTULA PLACEMENT Left 08/02/2018   Procedure: ARTERIOVENOUS (AV) FISTULA CREATION  left arm radiocephlic;  Surgeon: Marty Heck, MD;  Location: [facility];  Service: Vascular;  Laterality: Left;   AV FISTULA PLACEMENT Left 08/01/2019   Procedure: LEFT BRACHIOCEPHALIC ARTERIOVENOUS (AV) FISTULA CREATION;  Surgeon: [name], MD;  Location: [facility];  Service: Vascular;  Laterality: Left;   [place] Left 10/03/2019   Procedure: BASILIC VEIN TRANSPOSITION LEFT SECOND STAGE;  Surgeon: [name], MD;  [facility];  Service: Vascular;  Laterality: Left;   CARDIAC CATHETERIZATION  10/2002; 12/19/2004   [name] 03/08/2011   COLONOSCOPY  2013   [facility]    CORONARY ARTERY BYPASS GRAFT  02/24/2003   CABG X2/notes 03/08/2011   CORONARY STENT INTERVENTION N/A 12/19/2019   Procedure: CORONARY STENT INTERVENTION;  Surgeon: Jettie Booze, MD;  Location: [facility];  Service: Cardiovascular;  Laterality: N/A;   ESOPHAGOGASTRODUODENOSCOPY (EGD) WITH PROPOFOL N/A 02/08/2019   Procedure: ESOPHAGOGASTRODUODENOSCOPY (EGD) WITH PROPOFOL;  Surgeon: Milus Banister, MD;  Location: [facility];  Service: Gastroenterology;  Laterality: N/A;   ESOPHAGOGASTRODUODENOSCOPY (EGD) WITH PROPOFOL N/A 12/22/2019   Procedure: ESOPHAGOGASTRODUODENOSCOPY (EGD) WITH PROPOFOL;  Surgeon: Lavena Bullion, DO;  Location: [facility];  Service: Gastroenterology;  Laterality: N/A;   HEMOSTASIS CLIP PLACEMENT  12/22/2019   Procedure: HEMOSTASIS CLIP PLACEMENT;  Surgeon: Lavena Bullion, DO;  Location: [facility];  Service: Gastroenterology;;   HOT HEMOSTASIS N/A 02/08/2019    Procedure: HOT HEMOSTASIS (ARGON PLASMA COAGULATION/BICAP);  Surgeon: Milus Banister, MD;  [facility]OS[facility];  Service: Gastroenterology;  Laterality: N/A;   HOT HEMOSTASIS N/A 12/22/2019   Procedure: HOT HEMOSTASIS (ARGON PLASMA COAGULATION/BICAP);  Surgeon: Lavena Bullion, DO;  Location: [facility] ENDOSCOPY;  Service: Gastroenterology;  Laterality: N/A;   INTERTROCHANTERIC HIP FRACTURE SURGERY Left 11/2006   [name] 03/08/2011   INTRAVASCULAR ULTRASOUND/IVUS N/A 12/19/2019   Procedure: Intravascular Ultrasound/IVUS;  Surgeon: Jettie Booze, MD;  Location: [facility];  Service: Cardiovascular;  Laterality: N/A;   IR FLUORO GUIDE CV LINE RIGHT  07/24/2019   IR FLUORO GUIDE CV LINE RIGHT  07/30/2019   IR US GUIDE VASC ACCESS RIGHT  07/24/2019   IR US GUIDE VASC ACCESS RIGHT  07/30/2019   LAPAROSCOPIC CHOLECYSTECTOMY  05/2006   LIGATION OF COMPETING BRANCHES OF ARTERIOVENOUS FISTULA Left 11/05/2018   Procedure: LIGATION OF COMPETING BRANCHES OF ARTERIOVENOUS FISTULA  LEFT  ARM;  Surgeon: Marty Heck, MD;  Location: [facility];  Service: Vascular;  Laterality: Left;   LUMBAR LAMINECTOMY/DECOMPRESSION MICRODISCECTOMY N/A 02/29/2016   Procedure: Left L4-5 Lateral Recess  Decompression, Removal Extradural Intraspinal Facet Cyst;  Surgeon: Marybelle Killings, MD;  Location: [facility];  Service: Orthopedics;  Laterality: N/A;   MULTIPLE TOOTH EXTRACTIONS     ORIF MANDIBULAR FRACTURE Left 08/13/2004   ORIF of left body fracture mandible with KLS Martin 2.3-mm six hole/notes 03/08/2011   RIGHT/LEFT HEART CATH AND CORONARY/GRAFT ANGIOGRAPHY N/A 12/19/2019   Procedure: RIGHT/LEFT HEART CATH AND CORONARY/GRAFT ANGIOGRAPHY;  Surgeon: Jettie Booze, MD;  Location: [facility];  Service: Cardiovascular;  Laterality: N/A;   SCLEROTHERAPY  12/22/2019   Procedure: SCLEROTHERAPY;  Surgeon: Lavena Bullion, DO;  Location: [facility] ENDOSCOPY;  Service: Gastroenterology;;    Allergies    Allergen Reactions   Augmentin [Amoxicillin-Pot Clavulanate] Diarrhea    Severe   Amphetamines Other (See Comments)    Unknown reaction type    Outpatient Encounter Medications as of 09/08/2020  Medication Sig   acetaminophen (TYLENOL) 650 MG CR tablet Take 650 mg by mouth every 8 (eight) hours as needed for pain.   allopurinol (ZYLOPRIM) 100 MG tablet TAKE 1 TABLET(100 MG) BY MOUTH DAILY   ANORO ELLIPTA 62.5-25 MCG/INH AEPB INHALE 1 PUFF BY MOUTH EVERY DAY   B-D UF III MINI PEN NEEDLES 31G X 5 MM MISC USE FOUR TIMES DAILY   bictegravir-emtricitabine-tenofovir AF (BIKTARVY) 50-200-25 MG TABS tablet Take 1 tablet by mouth daily.   carvedilol (COREG) 3.125 MG tablet Take 1 tablet (3.125 mg total) by mouth 2 (two) times daily.   Cinacalcet HCl (SENSIPAR PO) Take by mouth.   colchicine 0.6 MG tablet Take 0.6 mg by mouth daily as needed (pain).   diclofenac Sodium (VOLTAREN) 1 % GEL APPLY 2 GRAMS TOPICALLY TO THE AFFECTED AREA THREE TIMES DAILY AS NEEDED FOR PAIN   gabapentin (NEURONTIN) 300 MG capsule Take 300 mg by mouth 2 (two) times daily.   insulin lispro (HUMALOG KWIKPEN) 100 UNIT/ML KwikPen Inject 0.92ml (20 Units total) into the skin 3 (three) times daily as needed for high blood sugar (above 150)   iron sucrose in sodium chloride 0.9 % 100 mL Iron Sucrose (Venofer)   isosorbide mononitrate (IMDUR) 30 MG 24 hr tablet TAKE 1 TABLET BY MOUTH EVERY DAY   Lancets (ONETOUCH DELICA PLUS [id number]) MISC Inject 1 Device as directed 3 (three) times daily. Dx: E11.40   latanoprost (XALATAN) 0.005 % ophthalmic solution Place 1 drop into both eyes at bedtime.   Methoxy PEG-Epoetin Beta (MIRCERA IJ) Mircera   multivitamin (RENA-VIT) TABS tablet Take 1 tablet by mouth daily.   nitroGLYCERIN (NITROSTAT) 0.3 MG SL tablet ONE TABLET UNDER TONGUE AS NEEDED FOR CHEST PAIN   Nutritional Supplements (VITAMIN D BOOSTER PO) Take by mouth.   ONETOUCH ULTRA test strip CHECK BLOOD SUGAR 3  TIMES  DAILY   pantoprazole (PROTONIX) 40 MG tablet TAKE 1 TABLET(40 MG) BY MOUTH DAILY   sevelamer carbonate (RENVELA) 800 MG tablet Take 800 mg by mouth 3 (three) times daily with meals.   simethicone (MYLICON) 80 MG chewable tablet Chew 1 tablet (80 mg total) by mouth 4 (four) times daily as needed for flatulence.   ticagrelor (BRILINTA) 90 MG TABS tablet Take 1 tablet (90 mg total) by mouth 2 (two) times daily.   timolol (BETIMOL) 0.5 % ophthalmic solution Apply to eye.   timolol (TIMOPTIC) 0.5 % ophthalmic solution Place 1 drop into both eyes every morning.   [DISCONTINUED] mupirocin ointment (BACTROBAN) 2 % Apply to right foot and left hallux once daily.   No facility-administered encounter medications  on file as of 09/08/2020.    Review of Systems  Constitutional: Negative for appetite change, chills, fatigue and fever.  Respiratory: Negative for cough, chest tightness, shortness of breath and wheezing.   Cardiovascular: Negative for chest pain, palpitations and leg swelling.  Gastrointestinal: Negative for abdominal distention, abdominal pain, constipation, diarrhea, nausea and vomiting.  Musculoskeletal: Positive for arthralgias, back pain and gait problem. Negative for myalgias.       Has a cane but does not use it  Skin: Negative for color change, pallor and rash.  Neurological: Positive for numbness. Negative for dizziness, speech difficulty and light-headedness.  Hematological: Does not bruise/bleed easily.    Immunization History  Administered Date(s) Administered   Fluad Quad(high Dose 65+) 06/14/2019   Hepatitis B 08/28/2006, 10/02/2007, 04/01/2008   Hepatitis B, adult 06/03/2014, 07/04/2014   Influenza Split 07/28/2011   Influenza Whole 08/28/2006, 09/10/2007, 09/15/2008, 08/03/2009, 07/26/2010   Influenza, High Dose Seasonal PF 07/04/2018   Influenza,inj,Quad PF,6+ Mos 07/04/2014, 07/06/2015, 07/12/2016, 07/11/2017   Influenza-Unspecified 06/24/2013,  06/30/2020   PFIZER SARS-COV-2 Vaccination 11/16/2019, 12/07/2019   Pneumococcal Conjugate-13 08/18/2016   Pneumococcal Polysaccharide-23 08/28/2006, 07/28/2011, 05/30/2018   Tdap 06/14/2019   Zoster 06/03/2014   Zoster Recombinat (Shingrix) 07/24/2017, 01/09/2018   Pertinent  Health Maintenance Due  Topic Date Due   HEMOGLOBIN A1C  05/06/2020   OPHTHALMOLOGY EXAM  10/07/2020   LIPID PANEL  12/20/2020   FOOT EXAM  12/30/2020   COLONOSCOPY  10/01/2022   INFLUENZA VACCINE  Completed   PNA vac Low Risk Adult  Completed   Fall Risk  09/08/2020 07/07/2020 06/30/2020 01/28/2020 11/19/2019  Falls in the past year? 1 0 0 0 0  Comment Slip and fell off chair 1 month ago. - - - -  Number falls in past yr: 0 0 - 0 0  Comment - - - - -  Injury with Fall? 1 0 - 0 0  Comment - - - - -  Risk Factor Category  - - - - -  Risk for fall due to : - - - - -  Risk for fall due to: Comment - - - - -  Follow up - - - - -   Functional Status Survey:    Vitals:   09/08/20 1048  BP: 100/60  Pulse: 82  Resp: 16  Temp: 97.7 F (36.5 C)  SpO2: 98%  Weight: 181 lb 12.8 oz (82.5 kg)  Height: 6\' 2"  (1.88 m)   Body mass index is 23.34 kg/m. Physical Exam Vitals reviewed.  Constitutional:      General: He is not in acute distress.    Appearance: He is not ill-appearing.  HENT:     Head: Normocephalic.  Eyes:     General: No scleral icterus.       Right eye: No discharge.        Left eye: No discharge.     Conjunctiva/sclera: Conjunctivae normal.     Pupils: Pupils are equal, round, and reactive to light.  Cardiovascular:     Rate and Rhythm: Normal rate and regular rhythm.     Pulses: Normal pulses.     Heart sounds: Normal heart sounds. No murmur heard.  No friction rub. No gallop.   Pulmonary:     Effort: Pulmonary effort is normal. No respiratory distress.     Breath sounds: Normal breath sounds. No wheezing or rales.  Chest:     Chest wall: No tenderness.  Abdominal:  General: Bowel sounds are normal. There is no distension.     Palpations: Abdomen is soft. There is no mass.     Tenderness: There is no abdominal tenderness. There is no right CVA tenderness, left CVA tenderness, guarding or rebound.  Musculoskeletal:        General: No swelling.     Cervical back: Normal range of motion. No rigidity or tenderness.     Lumbar back: Tenderness present. No swelling. Normal range of motion. Negative right straight leg raise test and negative left straight leg raise test.     Right hip: Tenderness present. No crepitus. Normal range of motion. Normal strength.     Left hip: Normal.     Right lower leg: No edema.     Left lower leg: No edema.     Right foot: Normal range of motion and normal capillary refill. Tenderness present. No swelling or crepitus. Normal pulse.     Left foot: Normal.     Comments: Unsteady gait  Tender on right lower back,hip and across 3rd,4th and 5 th toe.   Lymphadenopathy:     Cervical: No cervical adenopathy.  Skin:    General: Skin is warm and dry.     Coloration: Skin is not pale.     Findings: No bruising, erythema, lesion or rash.  Neurological:     Mental Status: He is alert and oriented to person, place, and time.     Cranial Nerves: No cranial nerve deficit.     Motor: No weakness.     Coordination: Coordination normal.     Gait: Gait abnormal.  Psychiatric:        Mood and Affect: Mood normal.        Behavior: Behavior normal.        Thought Content: Thought content normal.        Judgment: Judgment normal.     Labs reviewed: Recent Labs    12/23/19 0832 12/23/19 0832 02/05/20 0000 03/11/20 1730 06/30/20 1049  NA 136  --  141 134* 140  K 3.8   < > 4.1 3.1* 5.0  CL 101   < > 103 93* 98  CO2 22  --   --  27 29  GLUCOSE 199*  --   --  168* 165*  BUN 80*  --  65* 32* 89*  CREATININE 7.28*  --  5.6* 3.15* 7.36*  CALCIUM 8.6*   < > 9.1 8.0* 8.7  PHOS 5.1*  --   --   --   --    < > = values in this interval  not displayed.   Recent Labs    10/21/19 1035 10/21/19 1035 11/09/19 0000 12/23/19 0832 02/05/20 0000 06/30/20 1049  AST 177*  --   --   --   --  15  ALT 214*  --   --   --   --  10  ALKPHOS 86  --  74  --  90  --   BILITOT 1.0  --   --   --   --  0.5  PROT 7.4  --   --   --   --  6.6  ALBUMIN 3.3*   < > 5.0 2.5* 3.6  --    < > = values in this interval not displayed.   Recent Labs    12/17/19 1042 12/19/19 0547 03/11/20 1730 03/19/20 1336 06/30/20 1049  WBC 4.5   < > 4.0 4.7 7.5  NEUTROABS  2.8  --  2.4 2,496  --   HGB 8.4*   < > 7.0* 8.2* 8.1*  HCT 26.0*   < > 23.0* 26.6* 24.8*  MCV 81   < > 99.1 100.0 99.2  PLT 145*   < > 200 222 150   < > = values in this interval not displayed.   Lab Results  Component Value Date   TSH 0.18 (A) 02/05/2020   Lab Results  Component Value Date   HGBA1C 4.4 02/05/2020   Lab Results  Component Value Date   CHOL 65 12/21/2019   HDL 17 (L) 12/21/2019   LDLCALC 29 12/21/2019   TRIG 95 12/21/2019   CHOLHDL 3.8 12/21/2019    Significant Diagnostic Results in last 30 days:  No results found.  Assessment/Plan 1. Acute right-sided low back pain with right-sided sciatica Status post fall from standing position on a chair.Hit back/hip on the TV.Npo loss of consciousness.Tender on right lumbar radiating to right leg.  - DG Lumbar Spine Complete; Future - DG Hip Unilat W OR W/O Pelvis 2-3 Views Right; Future - predniSONE (DELTASONE) 10 MG tablet; Take 4 tablets ( 40 mg) by mouth x 1 day then  Take 3 tablets ( 30 mg ) by mouth x 1 day then  Take 2 Tablets ( 20 mg ) by mouth x 1 day then  Take 1 Tablet  ( 10 mg ) by mouth  Then stop.  Dispense: 10 tablet; Refill: 0  2. Right hip pain Status post fall as above hit hip on TV three weeks ago.  Continue on Tylenol  - DG Hip Unilat W OR W/O Pelvis 2-3 Views Right; Future  3. Right foot pain Unclear if toes caught the chair when he fell from the chair.  - continue on Tylenol for  pain Tender across 3rd,4th and 5 th toes  - DG Foot Complete Right; Future  4. Fall encounter  [place] while standing on a chair trying to fix a light ball hitting right lower back/hip on TV that was close to the chair.No dizziness ,loss of consciousness,bladder or bowel loss or weakness.  - Fall and safety precaution advised.   Family/ staff Communication: Reviewed plan of care with patient  Labs/tests ordered:   - DG Hip Unilat W OR W/O Pelvis 2-3 Views Right; Future  - DG Foot Complete Right; Future Next Appointment: 4 months for medical management of chronic issues.  [name], NP

## 2020-09-08 NOTE — Patient Instructions (Signed)
Take Prednisone as directed . Please get lower back,right hip and right foot X-ray at [facility] at the med center.will call you with results.   Radicular Pain Radicular pain is a type of pain that spreads from your back or neck along a spinal nerve. Spinal nerves are nerves that leave the spinal cord and go to the muscles. Radicular pain is sometimes called radiculopathy, radiculitis, or a pinched nerve. When you have this type of pain, you may also have weakness, numbness, or tingling in the area of your body that is supplied by the nerve. The pain may feel sharp and burning. Depending on which spinal nerve is affected, the pain may occur in the:  Neck area (cervical radicular pain). You may also feel pain, numbness, weakness, or tingling in the arms.  Mid-spine area (thoracic radicular pain). You would feel this pain in the back and chest. This type is rare.  Lower back area (lumbar radicular pain). You would feel this pain as low back pain. You may feel pain, numbness, weakness, or tingling in the buttocks or legs. Sciatica is a type of lumbar radicular pain that shoots down the back of the leg. Radicular pain occurs when one of the spinal nerves becomes irritated or squeezed (compressed). It is often caused by something pushing on a spinal nerve, such as one of the bones of the spine (vertebrae) or one of the round cushions between vertebrae (intervertebral disks). This can result from:  An injury.  Wear and tear or aging of a disk.  The growth of a bone spur that pushes on the nerve. Radicular pain often goes away when you follow instructions from your health care provider for relieving pain at home. Follow these instructions at home: Managing pain      If directed, put ice on the affected area: ? Put ice in a plastic bag. ? Place a towel between your skin and the bag. ? Leave the ice on for 20 minutes, 2-3 times a day.  If directed, apply heat to the affected area as  often as told by your health care provider. Use the heat source that your health care provider recommends, such as a moist heat pack or a heating pad. ? Place a towel between your skin and the heat source. ? Leave the heat on for 20-30 minutes. ? Remove the heat if your skin turns bright red. This is especially important if you are unable to feel pain, heat, or cold. You may have a greater risk of getting burned. Activity   Do not sit or rest in bed for long periods of time.  Try to stay as active as possible. Ask your health care provider what type of exercise or activity is best for you.  Avoid activities that make your pain worse, such as bending and lifting.  Do not lift anything that is heavier than 10 lb (4.5 kg), or the limit that you are told, until your health care provider says that it is safe.  Practice using proper technique when lifting items. Proper lifting technique involves bending your knees and rising up.  Do strength and range-of-motion exercises only as told by your health care provider or physical therapist. General instructions  Take over-the-counter and prescription medicines only as told by your health care provider.  Pay attention to any changes in your symptoms.  Keep all follow-up visits as told by your health care provider. This is important. ? Your health care provider may send you to a  physical therapist to help with this pain. Contact a health care provider if:  Your pain and other symptoms get worse.  Your pain medicine is not helping.  Your pain has not improved after a few weeks of home care.  You have a fever. Get help right away if:  You have severe pain, weakness, or numbness.  You have difficulty with bladder or bowel control. Summary  Radicular pain is a type of pain that spreads from your back or neck along a spinal nerve.  When you have radicular pain, you may also have weakness, numbness, or tingling in the area of your body that  is supplied by the nerve.  The pain may feel sharp or burning.  Radicular pain may be treated with ice, heat, medicines, or physical therapy. This information is not intended to replace advice given to you by your health care provider. Make sure you discuss any questions you have with your health care provider. Document Revised: 04/24/2018 Document Reviewed: 04/24/2018 Elsevier Patient Education  [place].

## 2020-09-09 ENCOUNTER — Other Ambulatory Visit: Payer: Self-pay | Admitting: Family

## 2020-09-09 DIAGNOSIS — M5441 Lumbago with sciatica, right side: Secondary | ICD-10-CM

## 2020-09-09 DIAGNOSIS — M79671 Pain in right foot: Secondary | ICD-10-CM

## 2020-09-09 DIAGNOSIS — S99101A Unspecified physeal fracture of right metatarsal, initial encounter for closed fracture: Secondary | ICD-10-CM

## 2020-09-09 DIAGNOSIS — D631 Anemia in chronic kidney disease: Secondary | ICD-10-CM | POA: Diagnosis not present

## 2020-09-09 DIAGNOSIS — M25551 Pain in right hip: Secondary | ICD-10-CM

## 2020-09-09 DIAGNOSIS — N186 End stage renal disease: Secondary | ICD-10-CM | POA: Diagnosis not present

## 2020-09-09 DIAGNOSIS — Z992 Dependence on renal dialysis: Secondary | ICD-10-CM | POA: Diagnosis not present

## 2020-09-09 DIAGNOSIS — N2581 Secondary hyperparathyroidism of renal origin: Secondary | ICD-10-CM | POA: Diagnosis not present

## 2020-09-09 DIAGNOSIS — E1122 Type 2 diabetes mellitus with diabetic chronic kidney disease: Secondary | ICD-10-CM | POA: Diagnosis not present

## 2020-09-09 DIAGNOSIS — D509 Iron deficiency anemia, unspecified: Secondary | ICD-10-CM | POA: Diagnosis not present

## 2020-09-10 ENCOUNTER — Telehealth: Payer: Self-pay

## 2020-09-10 DIAGNOSIS — M25551 Pain in right hip: Secondary | ICD-10-CM

## 2020-09-10 MED ORDER — GABAPENTIN 300 MG PO CAPS: 300.0000 mg | ORAL_CAPSULE | Freq: Two times a day (BID) | ORAL | Status: DC

## 2020-09-10 MED ORDER — LIDOCAINE 5 % EX PTCH: 1.0000 | MEDICATED_PATCH | CUTANEOUS | 0 refills | Status: DC

## 2020-09-10 NOTE — Telephone Encounter (Signed)
Spoke with patient, patient states he has told Dinah that Gabapentin 300 mg three times daily is too potent and he was take off that dose.    Patient states he tried Lidocaine patches, gel and every other formulation that they have for Lidocaine and it did not work.  Patient is requesting for another recommendation. Please advise

## 2020-09-10 NOTE — Telephone Encounter (Signed)
lidoderm script send to pharmacy.

## 2020-09-10 NOTE — Telephone Encounter (Signed)
Jacob [name] is this patch consider a controlled substance? If so you will need to send to [facility]  RX is pending

## 2020-09-10 NOTE — Telephone Encounter (Signed)
Increase Gabapentin 300 mg tablet to one by mouth three times daily.  Apply Lidoderm 5 % topical patch to right hip daily for pain.

## 2020-09-10 NOTE — Addendum Note (Signed)
Addended byMarlowe Sax C on: 09/10/2020 03:04 PM   Modules accepted: Orders

## 2020-09-10 NOTE — Telephone Encounter (Signed)
Left message on voicemail for patient to return call when available   

## 2020-09-10 NOTE — Addendum Note (Signed)
Addended by: Logan Bores on: 09/10/2020 02:28 PM   Modules accepted: Orders

## 2020-09-10 NOTE — Telephone Encounter (Signed)
Incoming call received from patient stating he was seen this week for neuropathy and Dinah referred him to a specialist. Patient is questioning if [facility] can give her something stronger for the neuropathy in the interim    Please advise

## 2020-09-10 NOTE — Telephone Encounter (Signed)
Lidoderm patch is a prescription medication has not used Lidoderm.recommend patch until seen by Orthopedic.

## 2020-09-11 DIAGNOSIS — N186 End stage renal disease: Secondary | ICD-10-CM | POA: Diagnosis not present

## 2020-09-11 DIAGNOSIS — D509 Iron deficiency anemia, unspecified: Secondary | ICD-10-CM | POA: Diagnosis not present

## 2020-09-11 DIAGNOSIS — E1122 Type 2 diabetes mellitus with diabetic chronic kidney disease: Secondary | ICD-10-CM | POA: Diagnosis not present

## 2020-09-11 DIAGNOSIS — N2581 Secondary hyperparathyroidism of renal origin: Secondary | ICD-10-CM | POA: Diagnosis not present

## 2020-09-11 DIAGNOSIS — Z992 Dependence on renal dialysis: Secondary | ICD-10-CM | POA: Diagnosis not present

## 2020-09-11 DIAGNOSIS — D631 Anemia in chronic kidney disease: Secondary | ICD-10-CM | POA: Diagnosis not present

## 2020-09-13 DIAGNOSIS — N2581 Secondary hyperparathyroidism of renal origin: Secondary | ICD-10-CM | POA: Diagnosis not present

## 2020-09-13 DIAGNOSIS — N186 End stage renal disease: Secondary | ICD-10-CM | POA: Diagnosis not present

## 2020-09-13 DIAGNOSIS — E1122 Type 2 diabetes mellitus with diabetic chronic kidney disease: Secondary | ICD-10-CM | POA: Diagnosis not present

## 2020-09-13 DIAGNOSIS — D509 Iron deficiency anemia, unspecified: Secondary | ICD-10-CM | POA: Diagnosis not present

## 2020-09-13 DIAGNOSIS — D631 Anemia in chronic kidney disease: Secondary | ICD-10-CM | POA: Diagnosis not present

## 2020-09-13 DIAGNOSIS — Z992 Dependence on renal dialysis: Secondary | ICD-10-CM | POA: Diagnosis not present

## 2020-09-15 DIAGNOSIS — Z992 Dependence on renal dialysis: Secondary | ICD-10-CM | POA: Diagnosis not present

## 2020-09-15 DIAGNOSIS — D631 Anemia in chronic kidney disease: Secondary | ICD-10-CM | POA: Diagnosis not present

## 2020-09-15 DIAGNOSIS — N186 End stage renal disease: Secondary | ICD-10-CM | POA: Diagnosis not present

## 2020-09-15 DIAGNOSIS — D509 Iron deficiency anemia, unspecified: Secondary | ICD-10-CM | POA: Diagnosis not present

## 2020-09-15 DIAGNOSIS — E1122 Type 2 diabetes mellitus with diabetic chronic kidney disease: Secondary | ICD-10-CM | POA: Diagnosis not present

## 2020-09-15 DIAGNOSIS — N2581 Secondary hyperparathyroidism of renal origin: Secondary | ICD-10-CM | POA: Diagnosis not present

## 2020-09-16 ENCOUNTER — Telehealth: Payer: Self-pay | Admitting: *Deleted

## 2020-09-16 DIAGNOSIS — M25551 Pain in right hip: Secondary | ICD-10-CM

## 2020-09-16 MED ORDER — TRAMADOL HCL 50 MG PO TABS: 25.0000 mg | ORAL_TABLET | Freq: Two times a day (BID) | ORAL | 0 refills | Status: DC

## 2020-09-16 NOTE — Telephone Encounter (Signed)
Patient notified.  Pended Rx and sent to [facility] for approval.

## 2020-09-16 NOTE — Telephone Encounter (Signed)
Take Tramadol 25 mg tablet one by mouth twice daily for pain

## 2020-09-16 NOTE — Telephone Encounter (Signed)
Tramadol Script send to pharmacy.

## 2020-09-16 NOTE — Telephone Encounter (Signed)
Patient called and stated that he was seen last Tuesday by Dinah and given Lidocaine patch for his pain going down his leg into his foot.  Patient stated that this is not helping the pain. Wants to know if you will prescribe something different until he can get to his appointment with the foot Dr. On 09/24/20.  Please Advise.

## 2020-09-17 ENCOUNTER — Other Ambulatory Visit: Payer: Self-pay | Admitting: Nurse Practitioner

## 2020-09-18 DIAGNOSIS — Z992 Dependence on renal dialysis: Secondary | ICD-10-CM | POA: Diagnosis not present

## 2020-09-18 DIAGNOSIS — D509 Iron deficiency anemia, unspecified: Secondary | ICD-10-CM | POA: Diagnosis not present

## 2020-09-18 DIAGNOSIS — D631 Anemia in chronic kidney disease: Secondary | ICD-10-CM | POA: Diagnosis not present

## 2020-09-18 DIAGNOSIS — E1122 Type 2 diabetes mellitus with diabetic chronic kidney disease: Secondary | ICD-10-CM | POA: Diagnosis not present

## 2020-09-18 DIAGNOSIS — N2581 Secondary hyperparathyroidism of renal origin: Secondary | ICD-10-CM | POA: Diagnosis not present

## 2020-09-18 DIAGNOSIS — N186 End stage renal disease: Secondary | ICD-10-CM | POA: Diagnosis not present

## 2020-09-21 DIAGNOSIS — E1122 Type 2 diabetes mellitus with diabetic chronic kidney disease: Secondary | ICD-10-CM | POA: Diagnosis not present

## 2020-09-21 DIAGNOSIS — N2581 Secondary hyperparathyroidism of renal origin: Secondary | ICD-10-CM | POA: Diagnosis not present

## 2020-09-21 DIAGNOSIS — D631 Anemia in chronic kidney disease: Secondary | ICD-10-CM | POA: Diagnosis not present

## 2020-09-21 DIAGNOSIS — Z992 Dependence on renal dialysis: Secondary | ICD-10-CM | POA: Diagnosis not present

## 2020-09-21 DIAGNOSIS — N186 End stage renal disease: Secondary | ICD-10-CM | POA: Diagnosis not present

## 2020-09-21 DIAGNOSIS — D509 Iron deficiency anemia, unspecified: Secondary | ICD-10-CM | POA: Diagnosis not present

## 2020-09-21 MED ORDER — GABAPENTIN 300 MG PO CAPS
300.0000 mg | ORAL_CAPSULE | Freq: Two times a day (BID) | ORAL | 1 refills | Status: DC
Start: 2020-09-21 — End: 2020-09-22

## 2020-09-21 NOTE — Addendum Note (Signed)
Addended by: Bonney Leitz T on: 09/21/2020 08:33 AM   Modules accepted: Orders

## 2020-09-22 ENCOUNTER — Ambulatory Visit: Payer: Medicare Other | Admitting: Podiatry

## 2020-09-22 DIAGNOSIS — N186 End stage renal disease: Secondary | ICD-10-CM | POA: Diagnosis not present

## 2020-09-22 DIAGNOSIS — Z992 Dependence on renal dialysis: Secondary | ICD-10-CM | POA: Diagnosis not present

## 2020-09-22 DIAGNOSIS — I129 Hypertensive chronic kidney disease with stage 1 through stage 4 chronic kidney disease, or unspecified chronic kidney disease: Secondary | ICD-10-CM | POA: Diagnosis not present

## 2020-09-22 MED ORDER — GABAPENTIN 300 MG PO CAPS
300.0000 mg | ORAL_CAPSULE | Freq: Two times a day (BID) | ORAL | 1 refills | Status: DC
Start: 2020-09-22 — End: 2020-12-09

## 2020-09-22 NOTE — Telephone Encounter (Signed)
Patient called disappointed that we approved Gabapentin for a 30 day supply yesterday. Patient stated all his rx's need to be for a 90 day supply going forward. Patient asked that I submit a rx for the pharmacy to hold until next refill that will reflect his request for a 90 day supply.  RX sent as requested with the notation to hold for future request

## 2020-09-22 NOTE — Addendum Note (Signed)
Addended by: Logan Bores on: 09/22/2020 12:56 PM   Modules accepted: Orders

## 2020-09-23 DIAGNOSIS — D509 Iron deficiency anemia, unspecified: Secondary | ICD-10-CM | POA: Diagnosis not present

## 2020-09-23 DIAGNOSIS — E1122 Type 2 diabetes mellitus with diabetic chronic kidney disease: Secondary | ICD-10-CM | POA: Diagnosis not present

## 2020-09-23 DIAGNOSIS — D631 Anemia in chronic kidney disease: Secondary | ICD-10-CM | POA: Diagnosis not present

## 2020-09-23 DIAGNOSIS — L299 Pruritus, unspecified: Secondary | ICD-10-CM | POA: Diagnosis not present

## 2020-09-23 DIAGNOSIS — Z992 Dependence on renal dialysis: Secondary | ICD-10-CM | POA: Diagnosis not present

## 2020-09-23 DIAGNOSIS — N186 End stage renal disease: Secondary | ICD-10-CM | POA: Diagnosis not present

## 2020-09-23 DIAGNOSIS — N2581 Secondary hyperparathyroidism of renal origin: Secondary | ICD-10-CM | POA: Diagnosis not present

## 2020-09-24 ENCOUNTER — Ambulatory Visit (INDEPENDENT_AMBULATORY_CARE_PROVIDER_SITE_OTHER): Payer: Medicare Other | Admitting: Nurse Practitioner

## 2020-09-24 ENCOUNTER — Other Ambulatory Visit: Payer: Self-pay

## 2020-09-24 ENCOUNTER — Telehealth: Payer: Self-pay

## 2020-09-24 ENCOUNTER — Encounter: Payer: Medicare Other | Admitting: Nurse Practitioner

## 2020-09-24 ENCOUNTER — Ambulatory Visit (INDEPENDENT_AMBULATORY_CARE_PROVIDER_SITE_OTHER): Payer: Medicare Other | Admitting: Podiatry

## 2020-09-24 DIAGNOSIS — M25551 Pain in right hip: Secondary | ICD-10-CM

## 2020-09-24 DIAGNOSIS — L84 Corns and callosities: Secondary | ICD-10-CM

## 2020-09-24 DIAGNOSIS — K219 Gastro-esophageal reflux disease without esophagitis: Secondary | ICD-10-CM

## 2020-09-24 DIAGNOSIS — S92351A Displaced fracture of fifth metatarsal bone, right foot, initial encounter for closed fracture: Secondary | ICD-10-CM | POA: Diagnosis not present

## 2020-09-24 DIAGNOSIS — M79671 Pain in right foot: Secondary | ICD-10-CM | POA: Diagnosis not present

## 2020-09-24 MED ORDER — TRAMADOL HCL 50 MG PO TABS: 50.0000 mg | ORAL_TABLET | Freq: Two times a day (BID) | ORAL | 0 refills | Status: DC

## 2020-09-24 NOTE — Progress Notes (Signed)
Careteam: Patient Care Team: [name], NP as PCP - General (Geriatric Medicine) [name], MD as PCP - Infectious Diseases (Infectious Diseases) [name], MD as PCP - Cardiology (Cardiology) [name], MD as Consulting Physician (Ophthalmology) [name], MD as Consulting Physician (Cardiology) [name], MD as Consulting Physician (Infectious Diseases) Gardiner Barefoot, DPM as Consulting Physician (Podiatry) [name], Nelda Bucks, NP as Nurse Practitioner (Family Medicine) [place], [name]  Advanced Directive information    Allergies  Allergen Reactions  . Augmentin [Amoxicillin-Pot Clavulanate] Diarrhea    Severe  . Amphetamines Other (See Comments)    Unknown reaction type    Chief Complaint  Patient presents with  . Acute Visit    Patient complains of bad taste in mouth and would like to know if it is coming from Tramadol. Patient also states that at night his house seems to hot sleep and wants to know if that is coming from the tramadaol as well.     HPI: Patient is a 69 y.o. male requesting appt due to bad taste in his mouth. Reports he is doing a lot more burping. Also felling a lot warmer in his house at night. Started when he started his tramadol.  Reports [facility] prescribed him tramadol 50 mg twice daily for the next 5 days due to increase in foot pain, also with bad right hip pain who he saw Dinah, Np in office for.  He was prescribed due to pain in right foot and hip. Also applied boot which he feels like is helping.  Tylenol eases pain but tramadol helps much more.   He is already on protonix for GERD.   Reports symptoms are not that bothersome but aware of them.   Does not have any coating in mouth or tongue.  No pain.   Review of Systems:  Review of Systems  Constitutional: Negative for chills, fever and weight loss.  Cardiovascular: Negative for chest pain.  Gastrointestinal: Positive for  heartburn. Negative for blood in stool, constipation, diarrhea, nausea and vomiting.  Musculoskeletal: Positive for joint pain and myalgias.    Past Medical History:  Diagnosis Date  . Acute respiratory failure ([place]) 03/01/2018  . Arthritis    "all over; mostly knees and back" (02/28/2018)  . CHF (congestive heart failure) ([facility])    not on any meds  . Chronic lower back pain    stenosis  . Community acquired pneumonia 09/06/2013  . COPD (chronic obstructive pulmonary disease) ([place])   . Coronary atherosclerosis of native coronary artery 2005   s/p surgery  . Drug abuse ([place])    hx; tested for cocaine as recently as 2/08. says he is not using drugs now - avoided defib. for this reason   . ESRD (end stage renal disease) ([place])    Hemo M-W-F- Richarda Blade  . GERD (gastroesophageal reflux disease)    takes OTC meds as needed  . GI bleeding 02/06/2019  . Glaucoma    uses eye drops daily  . Hepatitis B 1968   "tx'd w/isolation; caught it from toilet stools in gym"  . History of blood transfusion 03/01/2019  . History of colon polyps    benign  . History of gout    takes Allopurinol daily as well as Colchicine-if needed (02/28/2018)  . History of kidney stones   . HTN (hypertension)    takes Coreg,Imdur.and Apresoline daily  . Human immunodeficiency virus (HIV) disease ([place]) dx'd 1995   takes  Genvoya daily  . Hyperlipidemia    takes Atorvastatin daily  . Ischemic cardiomyopathy   . Muscle spasm    takes Zanaflex as needed  . Myocardial infarction ([place]) ~ 2004/2005  . Nocturia   . Peripheral neuropathy    takes gabapentin daily  . Pneumonia    "at least twice" (02/28/2018)  . Shortness of breath dyspnea    rarely but if notices it then with exertion  . Syphilis, unspecified   . Type II diabetes mellitus ([place]) 2004   Lantus daily.Average fasting blood sugar 125-199  . Wears glasses   . Wears partial dentures    Past Surgical History:  Procedure Laterality Date  . AV FISTULA  PLACEMENT Left 08/02/2018   Procedure: ARTERIOVENOUS (AV) FISTULA CREATION  left arm radiocephlic;  Surgeon: Marty Heck, MD;  Location: [place];  Service: Vascular;  Laterality: Left;  . AV FISTULA PLACEMENT Left 08/01/2019   Procedure: LEFT BRACHIOCEPHALIC ARTERIOVENOUS (AV) FISTULA CREATION;  Surgeon: [name], MD;  Location: [place];  Service: Vascular;  Laterality: Left;  . BASCILIC VEIN TRANSPOSITION Left 10/03/2019   Procedure: BASILIC VEIN TRANSPOSITION LEFT SECOND STAGE;  Surgeon: [name], MD;  Location: [place];  Service: Vascular;  Laterality: Left;  . CARDIAC CATHETERIZATION  10/2002; 12/19/2004   [name] 03/08/2011  . COLONOSCOPY  2013   Dr.John Henrene Pastor   . CORONARY ARTERY BYPASS GRAFT  02/24/2003   CABG X2/notes 03/08/2011  . CORONARY STENT INTERVENTION N/A 12/19/2019   Procedure: CORONARY STENT INTERVENTION;  Surgeon: Jettie Booze, MD;  Location: [place] [facility];  Service: Cardiovascular;  Laterality: N/A;  . ESOPHAGOGASTRODUODENOSCOPY (EGD) WITH PROPOFOL N/A 02/08/2019   Procedure: ESOPHAGOGASTRODUODENOSCOPY (EGD) WITH PROPOFOL;  Surgeon: Milus Banister, MD;  Location: [facility];  Service: Gastroenterology;  Laterality: N/A;  . ESOPHAGOGASTRODUODENOSCOPY (EGD) WITH PROPOFOL N/A 12/22/2019   Procedure: ESOPHAGOGASTRODUODENOSCOPY (EGD) WITH PROPOFOL;  Surgeon: Lavena Bullion, DO;  Location: [facility];  Service: Gastroenterology;  Laterality: N/A;  . HEMOSTASIS CLIP PLACEMENT  12/22/2019   Procedure: HEMOSTASIS CLIP PLACEMENT;  Surgeon: Lavena Bullion, DO;  Location: [facility];  Service: Gastroenterology;;  . HOT HEMOSTASIS N/A 02/08/2019   Procedure: HOT HEMOSTASIS (ARGON PLASMA COAGULATION/BICAP);  Surgeon: Milus Banister, MD;  Location[facility];  Service: Gastroenterology;  Laterality: N/A;  . HOT HEMOSTASIS N/A 12/22/2019   Procedure: HOT HEMOSTASIS (ARGON PLASMA COAGULATION/BICAP);  Surgeon: Lavena Bullion, DO;  Location: [facility]  ENDOSCOPY;  Service: Gastroenterology;  Laterality: N/A;  . INTERTROCHANTERIC HIP FRACTURE SURGERY Left 11/2006   [name] 03/08/2011  . INTRAVASCULAR ULTRASOUND/IVUS N/A 12/19/2019   Procedure: Intravascular Ultrasound/IVUS;  Surgeon: Jettie Booze, MD;  Location: [place] CV LAB;  Service: Cardiovascular;  Laterality: N/A;  . IR FLUORO GUIDE CV LINE RIGHT  07/24/2019  . IR FLUORO GUIDE CV LINE RIGHT  07/30/2019  . IR US GUIDE VASC ACCESS RIGHT  07/24/2019  . IR US GUIDE VASC ACCESS RIGHT  07/30/2019  . LAPAROSCOPIC CHOLECYSTECTOMY  05/2006  . LIGATION OF COMPETING BRANCHES OF ARTERIOVENOUS FISTULA Left 11/05/2018   Procedure: LIGATION OF COMPETING BRANCHES OF ARTERIOVENOUS FISTULA  LEFT  ARM;  Surgeon: Marty Heck, MD;  Location: [facility];  Service: Vascular;  Laterality: Left;  . LUMBAR LAMINECTOMY/DECOMPRESSION MICRODISCECTOMY N/A 02/29/2016   Procedure: Left L4-5 Lateral Recess Decompression, Removal Extradural Intraspinal Facet Cyst;  Surgeon: Marybelle Killings, MD;  Location: [place];  Service: Orthopedics;  Laterality: N/A;  . MULTIPLE TOOTH EXTRACTIONS    .  ORIF MANDIBULAR FRACTURE Left 08/13/2004   ORIF of left body fracture mandible with KLS Martin 2.3-mm six hole/notes 03/08/2011  . RIGHT/LEFT HEART CATH AND CORONARY/GRAFT ANGIOGRAPHY N/A 12/19/2019   Procedure: RIGHT/LEFT HEART CATH AND CORONARY/GRAFT ANGIOGRAPHY;  Surgeon: Jettie Booze, MD;  Location: [place] [facility];  Service: Cardiovascular;  Laterality: N/A;  . SCLEROTHERAPY  12/22/2019   Procedure: SCLEROTHERAPY;  Surgeon: Lavena Bullion, DO;  Location: [facility] ENDOSCOPY;  Service: Gastroenterology;;   Social History:   reports that he has been smoking cigarettes. He has a 21.50 pack-year smoking history. He has never used smokeless tobacco. He reports current alcohol use of about 12.0 standard drinks of alcohol per week. He reports that he does not use drugs.  Family History  Problem Relation Age of Onset  . Heart  failure Father   . Hypertension Father   . Diabetes Brother   . Heart attack Brother   . Alzheimer's disease Mother   . Stroke Sister   . Diabetes Sister   . Alzheimer's disease Sister   . Hypertension Brother   . Diabetes Brother   . Drug abuse Brother   . Colon cancer Neg Hx     Medications: Patient's Medications  New Prescriptions   No medications on file  Previous Medications   ACETAMINOPHEN (TYLENOL) 650 MG CR TABLET    Take 650 mg by mouth every 8 (eight) hours as needed for pain.   ALLOPURINOL (ZYLOPRIM) 100 MG TABLET    TAKE 1 TABLET(100 MG) BY MOUTH DAILY   ANORO ELLIPTA 62.5-25 MCG/INH AEPB    INHALE 1 PUFF BY MOUTH EVERY DAY   B-D UF III MINI PEN NEEDLES 31G X 5 MM MISC    USE FOUR TIMES DAILY   BICTEGRAVIR-EMTRICITABINE-TENOFOVIR AF (BIKTARVY) 50-200-25 MG TABS TABLET    Take 1 tablet by mouth daily.   CARVEDILOL (COREG) 3.125 MG TABLET    Take 1 tablet (3.125 mg total) by mouth 2 (two) times daily.   CINACALCET HCL (SENSIPAR PO)    Take by mouth.    COLCHICINE 0.6 MG TABLET    Take 0.6 mg by mouth daily as needed (pain).    DICLOFENAC SODIUM (VOLTAREN) 1 % GEL    APPLY 2 GRAMS TOPICALLY TO THE AFFECTED AREA THREE TIMES DAILY AS NEEDED FOR PAIN   GABAPENTIN (NEURONTIN) 300 MG CAPSULE    Take 1 capsule (300 mg total) by mouth 2 (two) times daily.   INSULIN LISPRO (HUMALOG KWIKPEN) 100 UNIT/ML KWIKPEN    Inject 0.37ml (20 Units total) into the skin 3 (three) times daily as needed for high blood sugar (above 150)   IRON SUCROSE IN SODIUM CHLORIDE 0.9 % 100 ML    Iron Sucrose (Venofer)   ISOSORBIDE MONONITRATE (IMDUR) 30 MG 24 HR TABLET    TAKE 1 TABLET BY MOUTH EVERY DAY   LANCETS (ONETOUCH DELICA PLUS [id number]) MISC    Inject 1 Device as directed 3 (three) times daily. Dx: E11.40   LATANOPROST (XALATAN) 0.005 % OPHTHALMIC SOLUTION    Place 1 drop into both eyes at bedtime.   LIDOCAINE (LIDODERM) 5 %    Place 1 patch onto the skin daily. Apply to right hip, Remove & Discard  patch within 12 hours or as directed by MD   METHOXY PEG-EPOETIN BETA (MIRCERA IJ)    Mircera   MULTIVITAMIN (RENA-VIT) TABS TABLET    Take 1 tablet by mouth daily.   NITROGLYCERIN (NITROSTAT) 0.3 MG SL TABLET  ONE TABLET UNDER TONGUE AS NEEDED FOR CHEST PAIN   NUTRITIONAL SUPPLEMENTS (VITAMIN D BOOSTER PO)    Take by mouth.   ONETOUCH ULTRA TEST STRIP    CHECK BLOOD SUGAR 3 TIMES  DAILY   PANTOPRAZOLE (PROTONIX) 40 MG TABLET    TAKE 1 TABLET(40 MG) BY MOUTH DAILY   SEVELAMER CARBONATE (RENVELA) 800 MG TABLET    Take 800 mg by mouth 3 (three) times daily with meals.   TICAGRELOR (BRILINTA) 90 MG TABS TABLET    Take 1 tablet (90 mg total) by mouth 2 (two) times daily.   TIMOLOL (BETIMOL) 0.5 % OPHTHALMIC SOLUTION    Apply to eye.   TIMOLOL (TIMOPTIC) 0.5 % OPHTHALMIC SOLUTION    Place 1 drop into both eyes every morning.   TRAMADOL (ULTRAM) 50 MG TABLET    Take 1 tablet (50 mg total) by mouth 2 (two) times daily for 5 days.  Modified Medications   No medications on file  Discontinued Medications   PREDNISONE (DELTASONE) 10 MG TABLET    Take 4 tablets ( 40 mg) by mouth x 1 day then  Take 3 tablets ( 30 mg ) by mouth x 1 day then  Take 2 Tablets ( 20 mg ) by mouth x 1 day then  Take 1 Tablet  ( 10 mg ) by mouth  Then stop.   SIMETHICONE (MYLICON) 80 MG CHEWABLE TABLET    Chew 1 tablet (80 mg total) by mouth 4 (four) times daily as needed for flatulence.    Physical Exam:  There were no vitals filed for this visit. There is no height or weight on file to calculate BMI. Wt Readings from Last 3 Encounters:  09/08/20 181 lb 12.8 oz (82.5 kg)  08/13/20 179 lb 6.4 oz (81.4 kg)  06/30/20 167 lb (75.8 kg)      Labs reviewed: Basic Metabolic Panel: Recent Labs    12/23/19 0832 12/23/19 0832 02/05/20 0000 03/11/20 1730 06/30/20 1049  NA 136  --  141 134* 140  K 3.8   < > 4.1 3.1* 5.0  CL 101   < > 103 93* 98  CO2 22  --   --  27 29  GLUCOSE 199*  --   --  168* 165*  BUN 80*  --   65* 32* 89*  CREATININE 7.28*  --  5.6* 3.15* 7.36*  CALCIUM 8.6*   < > 9.1 8.0* 8.7  PHOS 5.1*  --   --   --   --   TSH  --   --  0.18*  --   --    < > = values in this interval not displayed.   Liver Function Tests: Recent Labs    10/21/19 1035 10/21/19 1035 11/09/19 0000 12/23/19 0832 02/05/20 0000 06/30/20 1049  AST 177*  --   --   --   --  15  ALT 214*  --   --   --   --  10  ALKPHOS 86  --  74  --  90  --   BILITOT 1.0  --   --   --   --  0.5  PROT 7.4  --   --   --   --  6.6  ALBUMIN 3.3*   < > 5.0 2.5* 3.6  --    < > = values in this interval not displayed.   No results for input(s): LIPASE, AMYLASE in the last 8760 hours. No results  for input(s): AMMONIA in the last 8760 hours. CBC: Recent Labs    12/17/19 1042 12/19/19 0547 03/11/20 1730 03/19/20 1336 06/30/20 1049  WBC 4.5   < > 4.0 4.7 7.5  NEUTROABS 2.8  --  2.4 2,496  --   HGB 8.4*   < > 7.0* 8.2* 8.1*  HCT 26.0*   < > 23.0* 26.6* 24.8*  MCV 81   < > 99.1 100.0 99.2  PLT 145*   < > 200 222 150   < > = values in this interval not displayed.   Lipid Panel: Recent Labs    12/21/19 0352  CHOL 65  HDL 17*  LDLCALC 29  TRIG 95  CHOLHDL 3.8   TSH: Recent Labs    02/05/20 0000  TSH 0.18*   A1C: Lab Results  Component Value Date   HGBA1C 4.4 02/05/2020     Assessment/Plan 1. Right hip pain Ongoing, tramadol helping, following with orthopedic on the 14th for further evaluation.   2. Right foot pain -wearing boot as needed when out and about. Tramadol is helping. Will continue on that, side effects noted but not to the point he would like to stop medication.  3. GERD Continues on Protonix, encouraged lifestyle and diet modifications.   Next appt: 01/07/2021 Jacob [name]. Harle Battiest  [facility] [phone]    Virtual Visit via telephone  I connected with patient on 09/24/20 at  3:15 PM EST by telephone and verified that I am speaking with the correct  person using two identifiers.  Location: Patient: home Provider: [facility]    I discussed the limitations, risks, security and privacy concerns of performing an evaluation and management service by telephone and the availability of in person appointments. I also discussed with the patient that there may be a patient responsible charge related to this service. The patient expressed understanding and agreed to proceed.   I discussed the assessment and treatment plan with the patient. The patient was provided an opportunity to ask questions and all were answered. The patient agreed with the plan and demonstrated an understanding of the instructions.   The patient was advised to call back or seek an in-person evaluation if the symptoms worsen or if the condition fails to improve as anticipated.  I provided 15 minutes of non-face-to-face time during this encounter.  Jacob [name]. Harle Battiest Avs printed and mailed

## 2020-09-24 NOTE — Progress Notes (Signed)
This service is provided via telemedicine  No vital signs collected/recorded due to the encounter was a telemedicine visit.   Location of patient (ex: home, work):  Home  Patient consents to a telephone visit:  Yes, see encounter dated 07/07/2020  Location of the provider (ex: office, home):  [facility]  Name of any referring provider:  N/A  Names of all persons participating in the telemedicine service and their role in the encounter:  Jessica Eubanks, Nurse Practitioner, Lional Icenogle, CMA, and patient.   Time spent on call:  8 minutes with medical assistant  

## 2020-09-24 NOTE — Progress Notes (Signed)
   Jacob [name]. Jacob [name], Jacob [name] printed and mailed  This encounter was created in error - please disregard.

## 2020-09-24 NOTE — Telephone Encounter (Signed)
Called Jacob [name] to start his visit, patient stated that he was on his way to another appointment and did not know that he was scheduled for this appointment. Informed patient to call office to reschedule appointment. He stated that he will call the office once he is done with his other appointment.

## 2020-09-24 NOTE — Progress Notes (Signed)
  Subjective:  [name], male    DOB: 11-19-1950,  MRN: [id number]  Chief Complaint  Patient presents with  . routine foot care    Nail trim and PT stated that he has a fractured bone on the right foot that he is having looked at it    69 y.o. male presents with the above complaint. History confirmed with patient. He had a fall off a chair while standing on it. PCP sent him for x-rays  Objective:  Physical Exam: warm, good capillary refill, no trophic changes or ulcerative lesions and normal DP and PT pulses.  Mild pain lateral R foot.  Radiographs: X-ray of the right foot: spiral oblique fracture of distal diaphysis of 5th metatarsal Assessment:   1. Callus of foot   2. Closed non-physeal fracture of fifth metatarsal bone of right foot, initial encounter   3. Right hip pain      Plan:  Patient was evaluated and treated and all questions answered.   -XR Reviewed with patient -Would benefit from trial of non-operative management for this injury. -WBAT in CAM Boot  -new x-rays at next visit to assess fracture healing  Return in about 1 month (around 10/25/2020) for re-check fracture and get new xray.

## 2020-09-25 DIAGNOSIS — D509 Iron deficiency anemia, unspecified: Secondary | ICD-10-CM | POA: Diagnosis not present

## 2020-09-25 DIAGNOSIS — D631 Anemia in chronic kidney disease: Secondary | ICD-10-CM | POA: Diagnosis not present

## 2020-09-25 DIAGNOSIS — Z992 Dependence on renal dialysis: Secondary | ICD-10-CM | POA: Diagnosis not present

## 2020-09-25 DIAGNOSIS — E1122 Type 2 diabetes mellitus with diabetic chronic kidney disease: Secondary | ICD-10-CM | POA: Diagnosis not present

## 2020-09-25 DIAGNOSIS — L299 Pruritus, unspecified: Secondary | ICD-10-CM | POA: Diagnosis not present

## 2020-09-25 DIAGNOSIS — N186 End stage renal disease: Secondary | ICD-10-CM | POA: Diagnosis not present

## 2020-09-25 DIAGNOSIS — N2581 Secondary hyperparathyroidism of renal origin: Secondary | ICD-10-CM | POA: Diagnosis not present

## 2020-09-28 ENCOUNTER — Telehealth: Payer: Self-pay

## 2020-09-28 ENCOUNTER — Telehealth: Payer: Self-pay | Admitting: Podiatry

## 2020-09-28 DIAGNOSIS — L299 Pruritus, unspecified: Secondary | ICD-10-CM | POA: Diagnosis not present

## 2020-09-28 DIAGNOSIS — Z992 Dependence on renal dialysis: Secondary | ICD-10-CM | POA: Diagnosis not present

## 2020-09-28 DIAGNOSIS — E1122 Type 2 diabetes mellitus with diabetic chronic kidney disease: Secondary | ICD-10-CM | POA: Diagnosis not present

## 2020-09-28 DIAGNOSIS — N2581 Secondary hyperparathyroidism of renal origin: Secondary | ICD-10-CM | POA: Diagnosis not present

## 2020-09-28 DIAGNOSIS — M25551 Pain in right hip: Secondary | ICD-10-CM

## 2020-09-28 DIAGNOSIS — D509 Iron deficiency anemia, unspecified: Secondary | ICD-10-CM | POA: Diagnosis not present

## 2020-09-28 DIAGNOSIS — N186 End stage renal disease: Secondary | ICD-10-CM | POA: Diagnosis not present

## 2020-09-28 DIAGNOSIS — D631 Anemia in chronic kidney disease: Secondary | ICD-10-CM | POA: Diagnosis not present

## 2020-09-28 MED ORDER — TRAMADOL HCL 50 MG PO TABS: 50.0000 mg | ORAL_TABLET | Freq: Two times a day (BID) | ORAL | 0 refills | Status: AC

## 2020-09-28 NOTE — Addendum Note (Signed)
Addended bySherryle Lis, Kayce Chismar R on: 09/28/2020 05:00 PM   Modules accepted: Orders

## 2020-09-28 NOTE — Telephone Encounter (Signed)
I sent 1 refill to begin taking Wednesday 12/8. This will be the last and only refill I send for this. He should take 975mg  tylenol 4 times a day otherwise. Advised to ice, elevate, and wear the boot.  Lanae Crumbly, DPM 09/28/2020

## 2020-09-28 NOTE — Telephone Encounter (Signed)
Patient stated they are still experiencing pain and has requested refill for pain meds, please advise

## 2020-09-28 NOTE — Telephone Encounter (Signed)
Pt would like a refill on medication please advise.

## 2020-09-29 DIAGNOSIS — E113293 Type 2 diabetes mellitus with mild nonproliferative diabetic retinopathy without macular edema, bilateral: Secondary | ICD-10-CM | POA: Diagnosis not present

## 2020-09-29 DIAGNOSIS — H2513 Age-related nuclear cataract, bilateral: Secondary | ICD-10-CM | POA: Diagnosis not present

## 2020-09-29 DIAGNOSIS — H524 Presbyopia: Secondary | ICD-10-CM | POA: Diagnosis not present

## 2020-09-29 DIAGNOSIS — H401132 Primary open-angle glaucoma, bilateral, moderate stage: Secondary | ICD-10-CM | POA: Diagnosis not present

## 2020-09-30 DIAGNOSIS — N2581 Secondary hyperparathyroidism of renal origin: Secondary | ICD-10-CM | POA: Diagnosis not present

## 2020-09-30 DIAGNOSIS — Z992 Dependence on renal dialysis: Secondary | ICD-10-CM | POA: Diagnosis not present

## 2020-09-30 DIAGNOSIS — D631 Anemia in chronic kidney disease: Secondary | ICD-10-CM | POA: Diagnosis not present

## 2020-09-30 DIAGNOSIS — D509 Iron deficiency anemia, unspecified: Secondary | ICD-10-CM | POA: Diagnosis not present

## 2020-09-30 DIAGNOSIS — L299 Pruritus, unspecified: Secondary | ICD-10-CM | POA: Diagnosis not present

## 2020-09-30 DIAGNOSIS — E1122 Type 2 diabetes mellitus with diabetic chronic kidney disease: Secondary | ICD-10-CM | POA: Diagnosis not present

## 2020-09-30 DIAGNOSIS — N186 End stage renal disease: Secondary | ICD-10-CM | POA: Diagnosis not present

## 2020-10-02 DIAGNOSIS — N186 End stage renal disease: Secondary | ICD-10-CM | POA: Diagnosis not present

## 2020-10-02 DIAGNOSIS — D631 Anemia in chronic kidney disease: Secondary | ICD-10-CM | POA: Diagnosis not present

## 2020-10-02 DIAGNOSIS — L299 Pruritus, unspecified: Secondary | ICD-10-CM | POA: Insufficient documentation

## 2020-10-02 DIAGNOSIS — Z992 Dependence on renal dialysis: Secondary | ICD-10-CM | POA: Diagnosis not present

## 2020-10-02 DIAGNOSIS — E1122 Type 2 diabetes mellitus with diabetic chronic kidney disease: Secondary | ICD-10-CM | POA: Diagnosis not present

## 2020-10-02 DIAGNOSIS — N2581 Secondary hyperparathyroidism of renal origin: Secondary | ICD-10-CM | POA: Diagnosis not present

## 2020-10-02 DIAGNOSIS — D509 Iron deficiency anemia, unspecified: Secondary | ICD-10-CM | POA: Diagnosis not present

## 2020-10-06 ENCOUNTER — Ambulatory Visit: Payer: Medicare Other | Admitting: Orthopaedic Surgery

## 2020-10-09 ENCOUNTER — Telehealth: Payer: Self-pay | Admitting: *Deleted

## 2020-10-09 DIAGNOSIS — D631 Anemia in chronic kidney disease: Secondary | ICD-10-CM | POA: Diagnosis not present

## 2020-10-09 DIAGNOSIS — D509 Iron deficiency anemia, unspecified: Secondary | ICD-10-CM | POA: Diagnosis not present

## 2020-10-09 DIAGNOSIS — Z992 Dependence on renal dialysis: Secondary | ICD-10-CM | POA: Diagnosis not present

## 2020-10-09 DIAGNOSIS — E1122 Type 2 diabetes mellitus with diabetic chronic kidney disease: Secondary | ICD-10-CM | POA: Diagnosis not present

## 2020-10-09 DIAGNOSIS — L299 Pruritus, unspecified: Secondary | ICD-10-CM | POA: Diagnosis not present

## 2020-10-09 DIAGNOSIS — N186 End stage renal disease: Secondary | ICD-10-CM | POA: Diagnosis not present

## 2020-10-09 DIAGNOSIS — N2581 Secondary hyperparathyroidism of renal origin: Secondary | ICD-10-CM | POA: Diagnosis not present

## 2020-10-09 MED ORDER — TRAMADOL HCL 50 MG PO TABS
50.0000 mg | ORAL_TABLET | Freq: Two times a day (BID) | ORAL | 0 refills | Status: DC | PRN
Start: 2020-10-09 — End: 2020-11-07

## 2020-10-09 NOTE — Telephone Encounter (Signed)
LMOM with message [name] sent Tramadol to pharmacy

## 2020-10-09 NOTE — Telephone Encounter (Signed)
Patient called and stated that he is still having hip and foot pain. Tylenol is not working. Patient is wanting to know if you would give him some Tramadol to help with the pain until he sees Dr. Lorin Mercy on 10/20/2020.  Please Advise.

## 2020-10-09 NOTE — Telephone Encounter (Signed)
Rx sent to pharmacy   

## 2020-10-09 NOTE — Telephone Encounter (Signed)
Patient called back wondering if Jacob [name] is going to prescribe some Tramadol for his pain until his appointment with Dr. Lorin Mercy.  Please Advise.

## 2020-10-16 ENCOUNTER — Emergency Department (HOSPITAL_COMMUNITY): Payer: Medicare Other

## 2020-10-16 ENCOUNTER — Other Ambulatory Visit: Payer: Self-pay

## 2020-10-16 ENCOUNTER — Encounter (HOSPITAL_COMMUNITY): Payer: Self-pay | Admitting: Emergency Medicine

## 2020-10-16 ENCOUNTER — Inpatient Hospital Stay (HOSPITAL_COMMUNITY)
Admission: EM | Admit: 2020-10-16 | Discharge: 2020-10-19 | DRG: 377 | Disposition: A | Discharge status: Home / Self Care | Payer: Medicare Other | Attending: Internal Medicine | Admitting: Internal Medicine

## 2020-10-16 DIAGNOSIS — I255 Ischemic cardiomyopathy: Secondary | ICD-10-CM | POA: Diagnosis present

## 2020-10-16 DIAGNOSIS — K5730 Diverticulosis of large intestine without perforation or abscess without bleeding: Secondary | ICD-10-CM | POA: Diagnosis not present

## 2020-10-16 DIAGNOSIS — D649 Anemia, unspecified: Secondary | ICD-10-CM | POA: Diagnosis not present

## 2020-10-16 DIAGNOSIS — Z20822 Contact with and (suspected) exposure to covid-19: Secondary | ICD-10-CM | POA: Diagnosis present

## 2020-10-16 DIAGNOSIS — Z955 Presence of coronary angioplasty implant and graft: Secondary | ICD-10-CM

## 2020-10-16 DIAGNOSIS — H409 Unspecified glaucoma: Secondary | ICD-10-CM | POA: Diagnosis present

## 2020-10-16 DIAGNOSIS — Z21 Asymptomatic human immunodeficiency virus [HIV] infection status: Secondary | ICD-10-CM | POA: Diagnosis present

## 2020-10-16 DIAGNOSIS — E871 Hypo-osmolality and hyponatremia: Secondary | ICD-10-CM | POA: Diagnosis not present

## 2020-10-16 DIAGNOSIS — Z8719 Personal history of other diseases of the digestive system: Secondary | ICD-10-CM

## 2020-10-16 DIAGNOSIS — K31811 Angiodysplasia of stomach and duodenum with bleeding: Principal | ICD-10-CM | POA: Diagnosis present

## 2020-10-16 DIAGNOSIS — Z951 Presence of aortocoronary bypass graft: Secondary | ICD-10-CM | POA: Diagnosis not present

## 2020-10-16 DIAGNOSIS — I2510 Atherosclerotic heart disease of native coronary artery without angina pectoris: Secondary | ICD-10-CM | POA: Diagnosis not present

## 2020-10-16 DIAGNOSIS — D696 Thrombocytopenia, unspecified: Secondary | ICD-10-CM | POA: Diagnosis present

## 2020-10-16 DIAGNOSIS — N19 Unspecified kidney failure: Secondary | ICD-10-CM | POA: Diagnosis not present

## 2020-10-16 DIAGNOSIS — S2242XA Multiple fractures of ribs, left side, initial encounter for closed fracture: Secondary | ICD-10-CM | POA: Diagnosis not present

## 2020-10-16 DIAGNOSIS — E8770 Fluid overload, unspecified: Secondary | ICD-10-CM | POA: Diagnosis present

## 2020-10-16 DIAGNOSIS — I340 Nonrheumatic mitral (valve) insufficiency: Secondary | ICD-10-CM | POA: Diagnosis not present

## 2020-10-16 DIAGNOSIS — D62 Acute posthemorrhagic anemia: Secondary | ICD-10-CM | POA: Diagnosis not present

## 2020-10-16 DIAGNOSIS — Z794 Long term (current) use of insulin: Secondary | ICD-10-CM

## 2020-10-16 DIAGNOSIS — I248 Other forms of acute ischemic heart disease: Secondary | ICD-10-CM | POA: Diagnosis present

## 2020-10-16 DIAGNOSIS — K5521 Angiodysplasia of colon with hemorrhage: Secondary | ICD-10-CM | POA: Diagnosis not present

## 2020-10-16 DIAGNOSIS — E875 Hyperkalemia: Secondary | ICD-10-CM | POA: Diagnosis not present

## 2020-10-16 DIAGNOSIS — I519 Heart disease, unspecified: Secondary | ICD-10-CM | POA: Diagnosis not present

## 2020-10-16 DIAGNOSIS — M109 Gout, unspecified: Secondary | ICD-10-CM | POA: Diagnosis present

## 2020-10-16 DIAGNOSIS — N2581 Secondary hyperparathyroidism of renal origin: Secondary | ICD-10-CM | POA: Diagnosis present

## 2020-10-16 DIAGNOSIS — Z7902 Long term (current) use of antithrombotics/antiplatelets: Secondary | ICD-10-CM

## 2020-10-16 DIAGNOSIS — Z833 Family history of diabetes mellitus: Secondary | ICD-10-CM

## 2020-10-16 DIAGNOSIS — N186 End stage renal disease: Secondary | ICD-10-CM | POA: Diagnosis not present

## 2020-10-16 DIAGNOSIS — I509 Heart failure, unspecified: Secondary | ICD-10-CM | POA: Diagnosis not present

## 2020-10-16 DIAGNOSIS — J449 Chronic obstructive pulmonary disease, unspecified: Secondary | ICD-10-CM | POA: Diagnosis present

## 2020-10-16 DIAGNOSIS — I132 Hypertensive heart and chronic kidney disease with heart failure and with stage 5 chronic kidney disease, or end stage renal disease: Secondary | ICD-10-CM | POA: Diagnosis not present

## 2020-10-16 DIAGNOSIS — Z992 Dependence on renal dialysis: Secondary | ICD-10-CM | POA: Diagnosis not present

## 2020-10-16 DIAGNOSIS — Z79899 Other long term (current) drug therapy: Secondary | ICD-10-CM

## 2020-10-16 DIAGNOSIS — R1084 Generalized abdominal pain: Secondary | ICD-10-CM | POA: Diagnosis not present

## 2020-10-16 DIAGNOSIS — F17210 Nicotine dependence, cigarettes, uncomplicated: Secondary | ICD-10-CM | POA: Diagnosis present

## 2020-10-16 DIAGNOSIS — R778 Other specified abnormalities of plasma proteins: Secondary | ICD-10-CM | POA: Diagnosis not present

## 2020-10-16 DIAGNOSIS — E1122 Type 2 diabetes mellitus with diabetic chronic kidney disease: Secondary | ICD-10-CM | POA: Diagnosis present

## 2020-10-16 DIAGNOSIS — I252 Old myocardial infarction: Secondary | ICD-10-CM

## 2020-10-16 DIAGNOSIS — Z9115 Patient's noncompliance with renal dialysis: Secondary | ICD-10-CM | POA: Diagnosis not present

## 2020-10-16 DIAGNOSIS — K922 Gastrointestinal hemorrhage, unspecified: Secondary | ICD-10-CM | POA: Diagnosis not present

## 2020-10-16 DIAGNOSIS — I200 Unstable angina: Secondary | ICD-10-CM | POA: Diagnosis not present

## 2020-10-16 DIAGNOSIS — F1010 Alcohol abuse, uncomplicated: Secondary | ICD-10-CM | POA: Diagnosis present

## 2020-10-16 DIAGNOSIS — E1142 Type 2 diabetes mellitus with diabetic polyneuropathy: Secondary | ICD-10-CM | POA: Diagnosis present

## 2020-10-16 DIAGNOSIS — Z820 Family history of epilepsy and other diseases of the nervous system: Secondary | ICD-10-CM

## 2020-10-16 DIAGNOSIS — Z8249 Family history of ischemic heart disease and other diseases of the circulatory system: Secondary | ICD-10-CM

## 2020-10-16 DIAGNOSIS — K4020 Bilateral inguinal hernia, without obstruction or gangrene, not specified as recurrent: Secondary | ICD-10-CM | POA: Diagnosis not present

## 2020-10-16 DIAGNOSIS — F1910 Other psychoactive substance abuse, uncomplicated: Secondary | ICD-10-CM | POA: Diagnosis present

## 2020-10-16 DIAGNOSIS — E1140 Type 2 diabetes mellitus with diabetic neuropathy, unspecified: Secondary | ICD-10-CM | POA: Diagnosis present

## 2020-10-16 DIAGNOSIS — I5022 Chronic systolic (congestive) heart failure: Secondary | ICD-10-CM | POA: Diagnosis not present

## 2020-10-16 DIAGNOSIS — F32A Depression, unspecified: Secondary | ICD-10-CM | POA: Diagnosis not present

## 2020-10-16 DIAGNOSIS — R079 Chest pain, unspecified: Secondary | ICD-10-CM | POA: Diagnosis not present

## 2020-10-16 DIAGNOSIS — M4317 Spondylolisthesis, lumbosacral region: Secondary | ICD-10-CM | POA: Diagnosis not present

## 2020-10-16 DIAGNOSIS — Z87442 Personal history of urinary calculi: Secondary | ICD-10-CM

## 2020-10-16 DIAGNOSIS — R109 Unspecified abdominal pain: Secondary | ICD-10-CM | POA: Diagnosis not present

## 2020-10-16 DIAGNOSIS — Z743 Need for continuous supervision: Secondary | ICD-10-CM | POA: Diagnosis not present

## 2020-10-16 DIAGNOSIS — K921 Melena: Secondary | ICD-10-CM | POA: Diagnosis not present

## 2020-10-16 DIAGNOSIS — E785 Hyperlipidemia, unspecified: Secondary | ICD-10-CM | POA: Diagnosis not present

## 2020-10-16 DIAGNOSIS — K31819 Angiodysplasia of stomach and duodenum without bleeding: Secondary | ICD-10-CM | POA: Diagnosis not present

## 2020-10-16 DIAGNOSIS — K551 Chronic vascular disorders of intestine: Secondary | ICD-10-CM | POA: Diagnosis not present

## 2020-10-16 DIAGNOSIS — Z823 Family history of stroke: Secondary | ICD-10-CM

## 2020-10-16 DIAGNOSIS — R0602 Shortness of breath: Secondary | ICD-10-CM | POA: Diagnosis not present

## 2020-10-16 DIAGNOSIS — I10 Essential (primary) hypertension: Secondary | ICD-10-CM | POA: Diagnosis present

## 2020-10-16 DIAGNOSIS — K219 Gastro-esophageal reflux disease without esophagitis: Secondary | ICD-10-CM | POA: Diagnosis present

## 2020-10-16 DIAGNOSIS — B20 Human immunodeficiency virus [HIV] disease: Secondary | ICD-10-CM | POA: Diagnosis present

## 2020-10-16 DIAGNOSIS — S2231XA Fracture of one rib, right side, initial encounter for closed fracture: Secondary | ICD-10-CM | POA: Diagnosis not present

## 2020-10-16 HISTORY — DX: Unspecified systolic (congestive) heart failure: I50.20

## 2020-10-16 LAB — COMPREHENSIVE METABOLIC PANEL
ALT: 20 U/L (ref 0–44)
AST: 32 U/L (ref 15–41)
Albumin: 3.3 g/dL — ABNORMAL LOW (ref 3.5–5.0)
Alkaline Phosphatase: 80 U/L (ref 38–126)
Anion gap: 25 — ABNORMAL HIGH (ref 5–15)
BUN: 203 mg/dL — ABNORMAL HIGH (ref 8–23)
CO2: 13 mmol/L — ABNORMAL LOW (ref 22–32)
Calcium: 8 mg/dL — ABNORMAL LOW (ref 8.9–10.3)
Chloride: 95 mmol/L — ABNORMAL LOW (ref 98–111)
Creatinine, Ser: 8.92 mg/dL — ABNORMAL HIGH (ref 0.61–1.24)
GFR, Estimated: 6 mL/min — ABNORMAL LOW (ref >60)
Glucose, Bld: 197 mg/dL — ABNORMAL HIGH (ref 70–99)
Potassium: 6.2 mmol/L — ABNORMAL HIGH (ref 3.5–5.1)
Sodium: 133 mmol/L — ABNORMAL LOW (ref 135–145)
Total Bilirubin: 0.4 mg/dL (ref 0.3–1.2)
Total Protein: 7 g/dL (ref 6.5–8.1)

## 2020-10-16 LAB — URINALYSIS, ROUTINE W REFLEX MICROSCOPIC
Bilirubin Urine: NEGATIVE
Glucose, UA: NEGATIVE mg/dL
Ketones, ur: NEGATIVE mg/dL
Leukocytes,Ua: NEGATIVE
Nitrite: NEGATIVE
Protein, ur: 30 mg/dL — AB
Specific Gravity, Urine: 1.016 (ref 1.005–1.030)
pH: 5 (ref 5.0–8.0)

## 2020-10-16 LAB — CBC
HCT: 22 % — ABNORMAL LOW (ref 39.0–52.0)
Hemoglobin: 7.5 g/dL — ABNORMAL LOW (ref 13.0–17.0)
MCH: 32.8 pg (ref 26.0–34.0)
MCHC: 34.1 g/dL (ref 30.0–36.0)
MCV: 96.1 fL (ref 80.0–100.0)
Platelets: 188 10*3/uL (ref 150–400)
RBC: 2.29 MIL/uL — ABNORMAL LOW (ref 4.22–5.81)
RDW: 16.6 % — ABNORMAL HIGH (ref 11.5–15.5)
WBC: 10.1 10*3/uL (ref 4.0–10.5)
nRBC: 0 % (ref 0.0–0.2)

## 2020-10-16 LAB — RESP PANEL BY RT-PCR (FLU A&B, COVID) ARPGX2
Influenza A by PCR: NEGATIVE
Influenza B by PCR: NEGATIVE
SARS Coronavirus 2 by RT PCR: NEGATIVE

## 2020-10-16 LAB — CBG MONITORING, ED: Glucose-Capillary: 128 mg/dL — ABNORMAL HIGH (ref 70–99)

## 2020-10-16 LAB — TROPONIN I (HIGH SENSITIVITY)
Troponin I (High Sensitivity): 68 ng/L — ABNORMAL HIGH (ref <18)
Troponin I (High Sensitivity): 73 ng/L — ABNORMAL HIGH (ref <18)

## 2020-10-16 LAB — LIPASE, BLOOD: Lipase: 70 U/L — ABNORMAL HIGH (ref 11–51)

## 2020-10-16 MED ORDER — SEVELAMER CARBONATE 800 MG PO TABS
2400.0000 mg | ORAL_TABLET | Freq: Three times a day (TID) | ORAL | Status: DC
  Filled 2020-10-16 (×2): qty 3

## 2020-10-16 MED ORDER — CINACALCET HCL 30 MG PO TABS: 90.0000 mg | ORAL_TABLET | ORAL | Status: DC

## 2020-10-16 MED ORDER — HYDROMORPHONE HCL 1 MG/ML IJ SOLN
0.5000 mg | Freq: Once | INTRAMUSCULAR | Status: AC
  Administered 2020-10-16: 21:00:00 0.5 mg via INTRAVENOUS
  Filled 2020-10-16: qty 1

## 2020-10-16 MED ORDER — DARBEPOETIN ALFA 100 MCG/0.5ML IJ SOSY: 100.0000 ug | PREFILLED_SYRINGE | INTRAMUSCULAR | Status: DC

## 2020-10-16 MED ORDER — INSULIN ASPART 100 UNIT/ML IV SOLN
5.0000 [IU] | Freq: Once | INTRAVENOUS | Status: AC
  Administered 2020-10-16: 20:00:00 5 [IU] via INTRAVENOUS

## 2020-10-16 MED ORDER — IOHEXOL 350 MG/ML SOLN
100.0000 mL | Freq: Once | INTRAVENOUS | Status: AC | PRN
  Administered 2020-10-16: 21:00:00 100 mL via INTRAVENOUS

## 2020-10-16 MED ORDER — CALCITRIOL 0.5 MCG PO CAPS
3.0000 ug | ORAL_CAPSULE | ORAL | Status: DC
  Filled 2020-10-16: qty 6

## 2020-10-16 MED ORDER — DEXTROSE 50 % IV SOLN
1.0000 | Freq: Once | INTRAVENOUS | Status: AC
  Administered 2020-10-16: 20:00:00 50 mL via INTRAVENOUS
  Filled 2020-10-16: qty 50

## 2020-10-16 MED ORDER — RENA-VITE PO TABS
1.0000 | ORAL_TABLET | Freq: Every day | ORAL | Status: DC
  Administered 2020-10-16 – 2020-10-18 (×3): 1 via ORAL
  Filled 2020-10-16 (×3): qty 1

## 2020-10-16 MED ORDER — HYDROMORPHONE HCL 1 MG/ML IJ SOLN
0.5000 mg | Freq: Once | INTRAMUSCULAR | Status: AC
  Administered 2020-10-16: 20:00:00 0.5 mg via INTRAVENOUS
  Filled 2020-10-16: qty 1

## 2020-10-16 MED ORDER — CALCIUM GLUCONATE 10 % IV SOLN
1.0000 g | Freq: Once | INTRAVENOUS | Status: AC
  Administered 2020-10-16: 20:00:00 1 g via INTRAVENOUS
  Filled 2020-10-16: qty 10

## 2020-10-16 MED ORDER — CHLORHEXIDINE GLUCONATE CLOTH 2 % EX PADS
6.0000 | MEDICATED_PAD | Freq: Every day | CUTANEOUS | Status: DC
  Administered 2020-10-18 – 2020-10-19 (×2): 6 via TOPICAL

## 2020-10-16 MED ORDER — SODIUM BICARBONATE 8.4 % IV SOLN
50.0000 meq | Freq: Once | INTRAVENOUS | Status: AC
  Administered 2020-10-16: 20:00:00 50 meq via INTRAVENOUS
  Filled 2020-10-16: qty 50

## 2020-10-16 NOTE — Progress Notes (Signed)
Patient arrived on the unit from the ED on a stretcher, assessment completed see flow sheet, placed on tele ccmd notified, patient oriented to room and staff, bed in lowest position call bell within reach will continue  to monitor.

## 2020-10-16 NOTE — ED Provider Notes (Signed)
[facility] Emergency Department Provider Note MRN:  [id number]  Arrival date & time: 10/16/20     Chief Complaint   Missed Dialysis and Alcohol Intoxication   History of Present Illness   Jacob [name] is a 69 y.o. year-old male with a history of ESRD, CHF presenting to the ED with chief complaint of missed dialysis.  Patient is uncertain how many dialysis sessions he has missed.  He has been drinking heavily over the week.  He is currently endorsing malaise and fatigue.  Feels "dehydrated".  Denies fever or cough, no chest pain.  Intermittent lower abdominal pain for the past few days   Review of Systems  A complete 10 system review of systems was obtained and all systems are negative except as noted in the HPI and PMH.   Patient's Health History    Past Medical History:  Diagnosis Date  . Acute respiratory failure ([place]) 03/01/2018  . Arthritis    "all over; mostly knees and back" (02/28/2018)  . CHF (congestive heart failure) (HCC)    not on any meds  . Chronic lower back pain    stenosis  . Community acquired pneumonia 09/06/2013  . COPD (chronic obstructive pulmonary disease) ([place])   . Coronary atherosclerosis of native coronary artery 2005   s/p surgery  . Drug abuse ([place])    hx; tested for cocaine as recently as 2/08. says he is not using drugs now - avoided defib. for this reason   . ESRD (end stage renal disease) ([place])    Hemo M-W-F- Richarda Blade  . GERD (gastroesophageal reflux disease)    takes OTC meds as needed  . GI bleeding 02/06/2019  . Glaucoma    uses eye drops daily  . Hepatitis B 1968   "tx'd w/isolation; caught it from toilet stools in gym"  . History of blood transfusion 03/01/2019  . History of colon polyps    benign  . History of gout    takes Allopurinol daily as well as Colchicine-if needed (02/28/2018)  . History of kidney stones   . HTN (hypertension)    takes Coreg,Imdur.and Apresoline daily  . Human immunodeficiency  virus (HIV) disease ([place]) dx'd 1995   takes Genvoya daily  . Hyperlipidemia    takes Atorvastatin daily  . Ischemic cardiomyopathy   . Muscle spasm    takes Zanaflex as needed  . Myocardial infarction ([place]) ~ 2004/2005  . Nocturia   . Peripheral neuropathy    takes gabapentin daily  . Pneumonia    "at least twice" (02/28/2018)  . Shortness of breath dyspnea    rarely but if notices it then with exertion  . Syphilis, unspecified   . Type II diabetes mellitus ([place]) 2004   Lantus daily.Average fasting blood sugar 125-199  . Wears glasses   . Wears partial dentures     Past Surgical History:  Procedure Laterality Date  . AV FISTULA PLACEMENT Left 08/02/2018   Procedure: ARTERIOVENOUS (AV) FISTULA CREATION  left arm radiocephlic;  Surgeon: Marty Heck, MD;  Location: [place];  Service: Vascular;  Laterality: Left;  . AV FISTULA PLACEMENT Left 08/01/2019   Procedure: LEFT BRACHIOCEPHALIC ARTERIOVENOUS (AV) FISTULA CREATION;  Surgeon: [name], MD;  Location: [place];  Service: Vascular;  Laterality: Left;  . BASCILIC VEIN TRANSPOSITION Left 10/03/2019   Procedure: BASILIC VEIN TRANSPOSITION LEFT SECOND STAGE;  Surgeon: [name], MD;  Location: [place];  Service: Vascular;  Laterality: Left;  . CARDIAC  CATHETERIZATION  10/2002; 12/19/2004   [name] 03/08/2011  . COLONOSCOPY  2013   Dr.John Henrene Pastor   . CORONARY ARTERY BYPASS GRAFT  02/24/2003   CABG X2/notes 03/08/2011  . CORONARY STENT INTERVENTION N/A 12/19/2019   Procedure: CORONARY STENT INTERVENTION;  Surgeon: Jettie Booze, MD;  Location: [place] [facility];  Service: Cardiovascular;  Laterality: N/A;  . ESOPHAGOGASTRODUODENOSCOPY (EGD) WITH PROPOFOL N/A 02/08/2019   Procedure: ESOPHAGOGASTRODUODENOSCOPY (EGD) WITH PROPOFOL;  Surgeon: Milus Banister, MD;  Location: [place];  Service: Gastroenterology;  Laterality: N/A;  . ESOPHAGOGASTRODUODENOSCOPY (EGD) WITH PROPOFOL N/A 12/22/2019   Procedure:  ESOPHAGOGASTRODUODENOSCOPY (EGD) WITH PROPOFOL;  Surgeon: Lavena Bullion, DO;  Location: [place];  Service: Gastroenterology;  Laterality: N/A;  . HEMOSTASIS CLIP PLACEMENT  12/22/2019   Procedure: HEMOSTASIS CLIP PLACEMENT;  Surgeon: Lavena Bullion, DO;  Location: [place];  Service: Gastroenterology;;  . HOT HEMOSTASIS N/A 02/08/2019   Procedure: HOT HEMOSTASIS (ARGON PLASMA COAGULATION/BICAP);  Surgeon: Milus Banister, MD;  Location: [facility] ENDOSCOPY;  Service: Gastroenterology;  Laterality: N/A;  . HOT HEMOSTASIS N/A 12/22/2019   Procedure: HOT HEMOSTASIS (ARGON PLASMA COAGULATION/BICAP);  Surgeon: Lavena Bullion, DO;  [facility] ENDOSCOPY;  Service: Gastroenterology;  Laterality: N/A;  . INTERTROCHANTERIC HIP FRACTURE SURGERY Left 11/2006   [name] 03/08/2011  . INTRAVASCULAR ULTRASOUND/IVUS N/A 12/19/2019   Procedure: Intravascular Ultrasound/IVUS;  Surgeon: Jettie Booze, MD;  Location: [place] [facility];  Service: Cardiovascular;  Laterality: N/A;  . IR FLUORO GUIDE CV LINE RIGHT  07/24/2019  . IR FLUORO GUIDE CV LINE RIGHT  07/30/2019  . IR US GUIDE VASC ACCESS RIGHT  07/24/2019  . IR US GUIDE VASC ACCESS RIGHT  07/30/2019  . LAPAROSCOPIC CHOLECYSTECTOMY  05/2006  . LIGATION OF COMPETING BRANCHES OF ARTERIOVENOUS FISTULA Left 11/05/2018   Procedure: LIGATION OF COMPETING BRANCHES OF ARTERIOVENOUS FISTULA  LEFT  ARM;  Surgeon: Marty Heck, MD;  Location: [place];  Service: Vascular;  Laterality: Left;  . LUMBAR LAMINECTOMY/DECOMPRESSION MICRODISCECTOMY N/A 02/29/2016   Procedure: Left L4-5 Lateral Recess Decompression, Removal Extradural Intraspinal Facet Cyst;  Surgeon: Marybelle Killings, MD;  Location: [place];  Service: Orthopedics;  Laterality: N/A;  . MULTIPLE TOOTH EXTRACTIONS    . ORIF MANDIBULAR FRACTURE Left 08/13/2004   ORIF of left body fracture mandible with KLS Martin 2.3-mm six hole/notes 03/08/2011  . RIGHT/LEFT HEART CATH AND CORONARY/GRAFT ANGIOGRAPHY  N/A 12/19/2019   Procedure: RIGHT/LEFT HEART CATH AND CORONARY/GRAFT ANGIOGRAPHY;  Surgeon: Jettie Booze, MD;  Location: [place] [facility];  Service: Cardiovascular;  Laterality: N/A;  . SCLEROTHERAPY  12/22/2019   Procedure: SCLEROTHERAPY;  Surgeon: Lavena Bullion, DO;  Location: [facility] ENDOSCOPY;  Service: Gastroenterology;;    Family History  Problem Relation Age of Onset  . Heart failure Father   . Hypertension Father   . Diabetes Brother   . Heart attack Brother   . Alzheimer's disease Mother   . Stroke Sister   . Diabetes Sister   . Alzheimer's disease Sister   . Hypertension Brother   . Diabetes Brother   . Drug abuse Brother   . Colon cancer Neg Hx     Social History   Socioeconomic History  . Marital status: Single    Spouse name: Not on file  . Number of children: 1  . Years of education: Not on file  . Highest education level: Not on file  Occupational History  . Occupation: retired  Tobacco Use  . Smoking status:  Current Every Day Smoker    Packs/day: 0.50    Years: 43.00    Pack years: 21.50    Types: Cigarettes  . Smokeless tobacco: Never Used  Vaping Use  . Vaping Use: Never used  Substance and Sexual Activity  . Alcohol use: Yes    Alcohol/week: 12.0 standard drinks    Types: 12 Standard drinks or equivalent per week    Comment: occ  . Drug use: No    Types: Cocaine    Comment: hx of crack/cocaine 14yrs ago 10/01/2019- none  . Sexual activity: Yes    Partners: Male  Other Topics Concern  . Not on file  Social History Narrative   Retired.       As of 05/2015:   Diet: No salt    Caffeine   Married: Single    House: [place], 2 stories, 1 person (self)    Pets: No    Current/Past profession: N/A   Exercise: walks daily    Living Will: Yes    DNR   POA/HPOA: No       Social Determinants of Radio broadcast assistant Strain: Not on file  Food Insecurity: Not on file  Transportation Needs: Not on file  Physical Activity: Not on file   Stress: Not on file  Social Connections: Not on file  Intimate Partner Violence: Not on file     Physical Exam   Vitals:   10/16/20 2215 10/16/20 2305  BP: (!) 141/60   Pulse:    Resp: 16   Temp:  (!) 97.5 F (36.4 C)  SpO2:      CONSTITUTIONAL: Chronically ill-appearing, NAD NEURO:  Alert and oriented x 3, no focal deficits EYES:  eyes equal and reactive ENT/NECK:  no LAD, no JVD CARDIO: Regular rate, well-perfused, normal S1 and S2 PULM:  CTAB, tachypneic GI/GU:  normal bowel sounds, non-distended, non-tender MSK/SPINE:  No gross deformities, no edema SKIN:  no rash, atraumatic PSYCH:  Appropriate speech and behavior  *Additional and/or pertinent findings included in MDM below  Diagnostic and Interventional Summary    EKG Interpretation  Date/Time:  Friday October 16 2020 21:03:33 EST Ventricular Rate:  122 PR Interval:    QRS Duration: 147 QT Interval:  439 QTC Calculation: 626 R Axis:   -62 Text Interpretation: Sinus tachycardia LAE, consider biatrial enlargement Right bundle branch block Inferior infarct, old When compared with ECG of EARLIER SAME DATE No significant change was found Confirmed by Delora Fuel ([id number]) on 10/16/2020 10:44:26 PM      Labs Reviewed  LIPASE, BLOOD - Abnormal; Notable for the following components:      Result Value   Lipase 70 (*)    All other components within normal limits  COMPREHENSIVE METABOLIC PANEL - Abnormal; Notable for the following components:   Sodium 133 (*)    Potassium 6.2 (*)    Chloride 95 (*)    CO2 13 (*)    Glucose, Bld 197 (*)    BUN 203 (*)    Creatinine, Ser 8.92 (*)    Calcium 8.0 (*)    Albumin 3.3 (*)    GFR, Estimated 6 (*)    Anion gap 25 (*)    All other components within normal limits  CBC - Abnormal; Notable for the following components:   RBC 2.29 (*)    Hemoglobin 7.5 (*)    HCT 22.0 (*)    RDW 16.6 (*)    All other components within normal limits  URINALYSIS, ROUTINE W REFLEX  MICROSCOPIC - Abnormal; Notable for the following components:   Hgb urine dipstick SMALL (*)    Protein, ur 30 (*)    Bacteria, UA RARE (*)    All other components within normal limits  CBG MONITORING, ED - Abnormal; Notable for the following components:   Glucose-Capillary 128 (*)    All other components within normal limits  TROPONIN I (HIGH SENSITIVITY) - Abnormal; Notable for the following components:   Troponin I (High Sensitivity) 68 (*)    All other components within normal limits  TROPONIN I (HIGH SENSITIVITY) - Abnormal; Notable for the following components:   Troponin I (High Sensitivity) 73 (*)    All other components within normal limits  RESP PANEL BY RT-PCR (FLU A&B, COVID) [id number]    CT Angio Chest/Abd/Pel for Dissection W and/or Wo Contrast  Final Result    DG Chest Port 1 View  Final Result      Medications  Chlorhexidine Gluconate Cloth 2 % PADS 6 each (has no administration in time range)  Darbepoetin Alfa (ARANESP) injection 100 mcg (has no administration in time range)  sevelamer carbonate (RENVELA) tablet 2,400 mg (has no administration in time range)  cinacalcet (SENSIPAR) tablet 90 mg (has no administration in time range)  calcitRIOL (ROCALTROL) capsule 3 mcg (has no administration in time range)  multivitamin (RENA-VIT) tablet 1 tablet (1 tablet Oral Given 10/16/20 2246)  sodium bicarbonate injection 50 mEq (50 mEq Intravenous Given 10/16/20 1933)  calcium gluconate inj 10% (1 g) URGENT USE ONLY! (1 g Intravenous Given 10/16/20 1949)  insulin aspart (novoLOG) injection 5 Units (5 Units Intravenous Given 10/16/20 2009)  dextrose 50 % solution 50 mL (50 mLs Intravenous Given 10/16/20 2001)  HYDROmorphone (DILAUDID) injection 0.5 mg (0.5 mg Intravenous Given 10/16/20 1944)  iohexol (OMNIPAQUE) 350 MG/ML injection 100 mL (100 mLs Intravenous Contrast Given 10/16/20 2036)  HYDROmorphone (DILAUDID) injection 0.5 mg (0.5 mg Intravenous Given 10/16/20 2121)      Procedures  /  Critical Care Ultrasound ED Peripheral IV (Provider)  Date/Time: 10/16/2020 7:32 PM Performed by: Maudie Flakes, MD Authorized by: Maudie Flakes, MD   Procedure details:    Indications: poor IV access     Skin Prep: chlorhexidine gluconate     Location:  Right AC   Angiocath:  18 G   Bedside Ultrasound Guided: Yes     Patient tolerated procedure without complications: Yes     Dressing applied: Yes    .Critical Care Performed by: Maudie Flakes, MD Authorized by: Maudie Flakes, MD   Critical care provider statement:    Critical care time (minutes):  45   Critical care was necessary to treat or prevent imminent or life-threatening deterioration of the following conditions: Hyperkalemia and uremia requiring urgent dialysis.   Critical care was time spent personally by me on the following activities:  Discussions with consultants, evaluation of patient's response to treatment, examination of patient, ordering and performing treatments and interventions, ordering and review of laboratory studies, ordering and review of radiographic studies, pulse oximetry, re-evaluation of patient's condition, obtaining history from patient or surrogate and review of old charts    ED Course and Medical Decision Making  I have reviewed the triage vital signs, the nursing notes, and pertinent available records from the EMR.  Listed above are laboratory and imaging tests that I personally ordered, reviewed, and interpreted and then considered in my medical decision making (see below for details).  Hyperkalemia  in the setting of missed dialysis, will consult nephrology.     EKG with possibly ischemic changes, patient initially without pain, mostly complaining of malaise.  Patient is now crying out in pain everywhere, including the chest and abdomen.  Awaiting troponin, obtaining dissection study.  Continued pain.  CT is without emergent process.  Nephrology is consulted with Dr.  Posey Pronto following, patient has received calcium, D50, insulin, bicarb.  Admitted to hospital service for further care.  Barth Kirks. Sedonia Small, MD [place] [email or web address]  Final Clinical Impressions(s) / ED Diagnoses     ICD-10-CM   1. Hyperkalemia  E87.5   2. Uremia  N19   3. Generalized abdominal pain  R10.84   4. Chest pain, unspecified type  R07.9     ED Discharge Orders    None       Discharge Instructions Discussed with and Provided to Patient:   Discharge Instructions   None       Maudie Flakes, MD 10/16/20 2312

## 2020-10-16 NOTE — ED Notes (Signed)
Md notified of pt hr sinus tach in the 120s

## 2020-10-16 NOTE — ED Notes (Signed)
Patient transported to CT 

## 2020-10-16 NOTE — ED Triage Notes (Signed)
Pt arrives to ED via [facility] to triage with c/o of missing last three dialysis appointments and now having abdominal cramping. Per pt he has ben getting drunk this week because hes depressed. Pt has been drinking liquor tonight. Denies SI/HI. States mild SOB that has worsened this week. Pt slurring words and unable to keep head up in triage.

## 2020-10-16 NOTE — Consult Note (Signed)
Reason for Consult: End-stage renal disease, hyperkalemia, uremia Referring Physician: Gerlene Fee, MD (EDP)  HPI:  69 year old African-American man with past medical history significant for hypertension, coronary artery disease, hepatitis B, HIV, congestive heart failure (EF 40 to 45%) and end-stage renal disease on hemodialysis on a Monday/Wednesday/Friday schedule.  He presented to the emergency room today with complaints of abdominal cramps and left-sided chest wall pain after 1 week of missed dialysis.  He also reports some shortness of breath on exertion today and denies any cough/sputum production or hemoptysis.  He has been exceedingly depressed over the past week or so about some familial issues and has been consuming large amounts of alcohol including today prior to admission.  He reports that he was missing dialysis because "he did not feel like going and does not trust the people there".  Hemodialysis prescription: [facility], MWF, 4 hours, Optiflux 180, BFR 400/DFR 800, EDW 80 kg, 2K/2.0 calcium, left upper arm AV fistula, 15 G needles, no heparin.  Venofer 50 mg IV weekly, calcitriol 3.25 mcg 3 times weekly, cinacalcet 90 mg 3 times weekly.  Past Medical History:  Diagnosis Date   Acute respiratory failure ([place]) 03/01/2018   Arthritis    "all over; mostly knees and back" (02/28/2018)   CHF (congestive heart failure) (H[facility])    not on any meds   Chronic lower back pain    stenosis   Community acquired pneumonia 09/06/2013   COPD (chronic obstructive pulmonary disease) ([place])    Coronary atherosclerosis of native coronary artery 2005   s/p surgery   Drug abuse ([place])    hx; tested for cocaine as recently as 2/08. says he is not using drugs now - avoided defib. for this reason    ESRD (end stage renal disease) ([place])    Hemo M-W-F- [facility] Blade   GERD (gastroesophageal reflux disease)    takes OTC meds as needed   GI bleeding 02/06/2019   Glaucoma    uses eye  drops daily   Hepatitis B 1968   "tx'd w/isolation; caught it from toilet stools in gym"   History of blood transfusion 03/01/2019   History of colon polyps    benign   History of gout    takes Allopurinol daily as well as Colchicine-if needed (02/28/2018)   History of kidney stones    HTN (hypertension)    takes Coreg,Imdur.and Apresoline daily   Human immunodeficiency virus (HIV) disease ([place]) dx'd 1995   takes Genvoya daily   Hyperlipidemia    takes Atorvastatin daily   Ischemic cardiomyopathy    Muscle spasm    takes Zanaflex as needed   Myocardial infarction ([place]) ~ 2004/2005   Nocturia    Peripheral neuropathy    takes gabapentin daily   Pneumonia    "at least twice" (02/28/2018)   Shortness of breath dyspnea    rarely but if notices it then with exertion   Syphilis, unspecified    Type II diabetes mellitus ([place]) 2004   Lantus daily.Average fasting blood sugar 125-199   Wears glasses    Wears partial dentures     Past Surgical History:  Procedure Laterality Date   AV FISTULA PLACEMENT Left 08/02/2018   Procedure: ARTERIOVENOUS (AV) FISTULA CREATION  left arm radiocephlic;  Surgeon: Marty Heck, MD;  Location[facility] OR;  Service: Vascular;  Laterality: Left;   AV FISTULA PLACEMENT Left 08/01/2019   Procedure: LEFT BRACHIOCEPHALIC ARTERIOVENOUS (AV) FISTULA CREATION;  Surgeon: [name], MD;  Location:  [place] OR;  Service: Vascular;  Laterality: Left;   BASCILIC VEIN TRANSPOSITION Left 10/03/2019   Procedure: BASILIC VEIN TRANSPOSITION LEFT SECOND STAGE;  Surgeon: [name], MD;  Location: [facility] OR;  Service: Vascular;  Laterality: Left;   CARDIAC CATHETERIZATION  10/2002; 12/19/2004   [name] 03/08/2011   COLONOSCOPY  2013   [place]    CORONARY ARTERY BYPASS GRAFT  02/24/2003   CABG X2/notes 03/08/2011   CORONARY STENT INTERVENTION N/A 12/19/2019   Procedure: CORONARY STENT INTERVENTION;  Surgeon: Jettie Booze, MD;  Location:  [place] CV LAB;  Service: Cardiovascular;  Laterality: N/A;   ESOPHAGOGASTRODUODENOSCOPY (EGD) WITH PROPOFOL N/A 02/08/2019   Procedure: ESOPHAGOGASTRODUODENOSCOPY (EGD) WITH PROPOFOL;  Surgeon: Milus Banister, MD;  Location: [place];  Service: Gastroenterology;  Laterality: N/A;   ESOPHAGOGASTRODUODENOSCOPY (EGD) WITH PROPOFOL N/A 12/22/2019   Procedure: ESOPHAGOGASTRODUODENOSCOPY (EGD) WITH PROPOFOL;  Surgeon: Lavena Bullion, DO;  Location: [place];  Service: Gastroenterology;  Laterality: N/A;   HEMOSTASIS CLIP PLACEMENT  12/22/2019   Procedure: HEMOSTASIS CLIP PLACEMENT;  Surgeon: Lavena Bullion, DO;  Location: [place];  Service: Gastroenterology;;   HOT HEMOSTASIS N/A 02/08/2019   Procedure: HOT HEMOSTASIS (ARGON PLASMA COAGULATION/BICAP);  Surgeon: Milus Banister, MD;  Location: [facility] ENDOSCOPY;  Service: Gastroenterology;  Laterality: N/A;   HOT HEMOSTASIS N/A 12/22/2019   Procedure: HOT HEMOSTASIS (ARGON PLASMA COAGULATION/BICAP);  Surgeon: Lavena Bullion, DO;  Location: [facility] ENDOSCOPY;  Service: Gastroenterology;  Laterality: N/A;   INTERTROCHANTERIC HIP FRACTURE SURGERY Left 11/2006   [name] 03/08/2011   INTRAVASCULAR ULTRASOUND/IVUS N/A 12/19/2019   Procedure: Intravascular Ultrasound/IVUS;  Surgeon: Jettie Booze, MD;  Location: [place] [facility] LAB;  Service: Cardiovascular;  Laterality: N/A;   IR FLUORO GUIDE CV LINE RIGHT  07/24/2019   IR FLUORO GUIDE CV LINE RIGHT  07/30/2019   IR US GUIDE VASC ACCESS RIGHT  07/24/2019   IR US GUIDE VASC ACCESS RIGHT  07/30/2019   LAPAROSCOPIC CHOLECYSTECTOMY  05/2006   LIGATION OF COMPETING BRANCHES OF ARTERIOVENOUS FISTULA Left 11/05/2018   Procedure: LIGATION OF COMPETING BRANCHES OF ARTERIOVENOUS FISTULA  LEFT  ARM;  Surgeon: Marty Heck, MD;  Location: [facility] OR;  Service: Vascular;  Laterality: Left;   LUMBAR LAMINECTOMY/DECOMPRESSION MICRODISCECTOMY N/A 02/29/2016   Procedure: Left L4-5 Lateral  Recess Decompression, Removal Extradural Intraspinal Facet Cyst;  Surgeon: Marybelle Killings, MD;  Location: [place];  Service: Orthopedics;  Laterality: N/A;   MULTIPLE TOOTH EXTRACTIONS     ORIF MANDIBULAR FRACTURE Left 08/13/2004   ORIF of left body fracture mandible with KLS Martin 2.3-mm six hole/notes 03/08/2011   RIGHT/LEFT HEART CATH AND CORONARY/GRAFT ANGIOGRAPHY N/A 12/19/2019   Procedure: RIGHT/LEFT HEART CATH AND CORONARY/GRAFT ANGIOGRAPHY;  Surgeon: Jettie Booze, MD;  Location: [place] [facility] LAB;  Service: Cardiovascular;  Laterality: N/A;   SCLEROTHERAPY  12/22/2019   Procedure: SCLEROTHERAPY;  Surgeon: Lavena Bullion, DO;  Location: [facility] ENDOSCOPY;  Service: Gastroenterology;;    Family History  Problem Relation Age of Onset   Heart failure Father    Hypertension Father    Diabetes Brother    Heart attack Brother    Alzheimer's disease Mother    Stroke Sister    Diabetes Sister    Alzheimer's disease Sister    Hypertension Brother    Diabetes Brother    Drug abuse Brother    Colon cancer Neg Hx     Social History:  reports that he has been smoking cigarettes. He has  a 21.50 pack-year smoking history. He has never used smokeless tobacco. He reports current alcohol use of about 12.0 standard drinks of alcohol per week. He reports that he does not use drugs.  Allergies:  Allergies  Allergen Reactions   Augmentin [Amoxicillin-Pot Clavulanate] Diarrhea    Severe   Amphetamines Other (See Comments)    Unknown reaction type    Medications:  Scheduled:  [START ON 10/17/2020] Chlorhexidine Gluconate Cloth  6 each Topical Q0600    HYDROmorphone (DILAUDID) injection  0.5 mg Intravenous Once    BMP Latest Ref Rng & Units 10/16/2020 06/30/2020 03/11/2020  Glucose 70 - 99 mg/dL 197(H) 165(H) 168(H)  BUN 8 - 23 mg/dL 203(H) 89(H) 32(H)  Creatinine 0.61 - 1.24 mg/dL 8.92(H) 7.36(H) 3.15(H)  BUN/Creat Ratio 6 - 22 (calc) - 12 -  Sodium 135 - 145 mmol/L  133(L) 140 134(L)  Potassium 3.5 - 5.1 mmol/L 6.2(H) 5.0 3.1(L)  Chloride 98 - 111 mmol/L 95(L) 98 93(L)  CO2 22 - 32 mmol/L 13(L) 29 27  Calcium 8.9 - 10.3 mg/dL 8.0(L) 8.7 8.0(L)   CBC Latest Ref Rng & Units 10/16/2020 06/30/2020 03/19/2020  WBC 4.0 - 10.5 K/uL 10.1 7.5 4.7  Hemoglobin 13.0 - 17.0 g/dL 7.5(L) 8.1(L) 8.2(L)  Hematocrit 39.0 - 52.0 % 22.0(L) 24.8(L) 26.6(L)  Platelets 150 - 400 K/uL 188 150 222     DG Chest Port 1 View  Result Date: 10/16/2020 CLINICAL DATA:  Shortness of breath. EXAM: PORTABLE CHEST 1 VIEW COMPARISON:  Chest x-ray 03/11/2020 FINDINGS: Slightly more prominent cardiac silhouette likely due to AP portable technique. Otherwise the heart size and mediastinal contours are unchanged. Aortic arch calcifications. Marked calcifications of the major vessels originating from the aortic arch. Coronary artery stent. No focal consolidation. No pulmonary edema. No pleural effusion. No pneumothorax. No acute osseous abnormality. Old left rib fractures. Old right rib fractures. IMPRESSION: No active disease. Electronically Signed   By: Iven Finn M.D.   On: 10/16/2020 19:16   CT Angio Chest/Abd/Pel for Dissection W and/or Wo Contrast  Result Date: 10/16/2020 CLINICAL DATA:  Abdominal pain, aortic dissection suspected. EXAM: CT ANGIOGRAPHY CHEST, ABDOMEN AND PELVIS TECHNIQUE: Non-contrast CT of the chest was initially obtained. Multidetector CT imaging through the chest, abdomen and pelvis was performed using the standard protocol during bolus administration of intravenous contrast. Multiplanar reconstructed images and MIPs were obtained and reviewed to evaluate the vascular anatomy. CONTRAST:  152mL OMNIPAQUE IOHEXOL 350 MG/ML SOLN COMPARISON:  CT abdomen pelvis 11/03/2015, CT abdomen pelvis 06/14/2006 FINDINGS: VASCULAR Aorta: Moderate to severe calcified and noncalcified atherosclerotic plaque. Normal caliber aorta without aneurysm, dissection, vasculitis or significant  stenosis. Celiac: Mild atherosclerotic plaque. Patent without evidence of aneurysm, dissection, vasculitis or significant stenosis. SMA: Severe atherosclerotic plaque with severe narrowing of the origin. Patent without evidence of aneurysm, dissection, vasculitis. Renals: At least moderate atherosclerotic plaque with mild to moderate stenosis of the origins. Both renal arteries are patent without evidence of aneurysm, dissection, vasculitis, fibromuscular dysplasia or significant stenosis. IMA: Patent without evidence of aneurysm, dissection, vasculitis or significant stenosis. Inflow: Moderate to severe atherosclerotic plaque. Patent without evidence of aneurysm, dissection, vasculitis or significant stenosis. Veins: No obvious venous abnormality within the limitations of this arterial phase study. Review of the MIP images confirms the above findings. NON-VASCULAR CTA CHEST FINDINGS Cardiovascular: Preferential opacification of the thoracic aorta. No evidence of thoracic aortic aneurysm or dissection. Normal heart size. No significant pericardial effusion. At least moderate four-vessel coronary artery calcifications status post  coronary artery bypass. The main pulmonary artery is normal in caliber. No central pulmonary embolus. Mediastinum/Nodes: No enlarged mediastinal, hilar, or axillary lymph nodes. Thyroid gland, trachea, and esophagus demonstrate no significant findings. Lungs/Pleura: Right lower lobe scattered peribronchovascular ground-glass airspace opacities (6:35, 47, 49). Nodular-like region within the right lower lobe measures up to 1.3 cm. Similar finding within left upper lobe (6:25). Nodular-ike region within the left upper lobe measures up to 7 mm. No pulmonary mass. No pleural effusion. No pneumothorax. Musculoskeletal: No chest wall abnormality No suspicious lytic or blastic osseous lesions. No acute displaced fracture. CTA ABDOMEN AND PELVIS FINDINGS Hepatobiliary: No focal liver abnormality is  seen. Status post cholecystectomy. No biliary dilatation. Pancreas: No focal lesion. Normal pancreatic contour. No surrounding inflammatory changes. No main pancreatic ductal dilatation. Spleen: Normal in size without focal abnormality. Adrenals/Urinary Tract: No adrenal nodule bilaterally. Bilateral kidneys enhance symmetrically. Lobulated renal parenchyma with associated scarring and suggestion of innumerable cystic lesions. Suggestion of ill-defined underlying renal lesions of which measure simple fluid (7:164, 161). Subcentimeter hypodensities are too small to characterize. No hydronephrosis. No hydroureter. The urinary bladder is unremarkable. Stomach/Bowel: Stomach is within normal limits. No evidence of bowel wall thickening or dilatation. Scattered sigmoid diverticulosis. Appendix appears normal. Lymphatic: No abdominal, pelvic, or inguinal lymphadenopathy. Reproductive: Prostate is unremarkable. Other: No intraperitoneal free fluid. No intraperitoneal free gas. No organized fluid collection. Musculoskeletal: Small fat containing left inguinal hernia. Possible tiny fat containing right inguinal hernia. No suspicious lytic or blastic osseous lesions. No acute displaced fracture. Old healed left rib fractures. Old healed left rib fractures. Multilevel degenerative changes of the spine with intervertebral disc space narrowing and vacuum phenomenon at the L4-L5 and L5-S1 levels. Trace retrolisthesis of L5 on S1. Review of the MIP images confirms the above findings. IMPRESSION: VASCULAR: 1. No aortic aneurysm or dissection. Aortic Atherosclerosis (ICD10-I70.0). 2. 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 NON-VASCULAR: 1. Vague peribronchovascular ground-glass airspace opacities, some of which are nodular-like, within the right lower lobe and left upper lobes. Findings could represent infection/inflammation with COVID 19 infection  not excluded. Non-contrast chest CT at 3-6 months is recommended. If the nodules are stable at time of repeat CT, then future CT at 18-24 months (from today's scan) is considered optional for low-risk patients, but is recommended for high-risk patients. This recommendation follows the consensus statement: Guidelines for Management of Incidental Pulmonary Nodules Detected on CT Images: From the Fleischner Society 2017; Radiology 2017; 284:228-243. 2. Interval development of multiple renal cystic lesions that are poorly visualized/evaluated. Recommend dedicated cross-sectional imaging renal protocol for further evaluation. 3. Scattered sigmoid diverticulosis with no acute diverticulitis. 4. Bilateral fat containing inguinal hernias. Electronically Signed   By: Iven Finn M.D.   On: 10/16/2020 21:10    Review of Systems  Constitutional: Positive for activity change and fatigue. Negative for appetite change, chills and fever.  HENT: Negative for congestion, dental problem, hearing loss, nosebleeds, sinus pressure, sinus pain and sore throat.   Eyes: Negative for photophobia, pain and redness.  Respiratory: Positive for shortness of breath. Negative for cough, chest tightness and wheezing.   Cardiovascular: Positive for chest pain. Negative for palpitations and leg swelling.       Left-sided chest wall pain  Gastrointestinal: Positive for abdominal pain. Negative for blood in stool, diarrhea, nausea and vomiting.  Endocrine: Negative for cold intolerance, heat intolerance and polyuria.  Genitourinary: Negative for dysuria, flank pain, frequency, hematuria and  urgency.  Musculoskeletal: Negative for arthralgias, back pain and myalgias.  Skin: Negative for rash and wound.  Neurological: Negative for tremors, weakness and headaches.   Blood pressure 138/65, pulse (!) 124, temperature 98.4 F (36.9 C), temperature source Oral, resp. rate 15, height 6\' 2"  (1.88 m), weight 81.6 kg, SpO2 99 %. Physical  Exam Vitals and nursing note reviewed.  Constitutional:      Appearance: He is normal weight. He is ill-appearing.  HENT:     Head: Normocephalic and atraumatic.     Right Ear: External ear normal.     Left Ear: External ear normal.     Nose: Nose normal.     Mouth/Throat:     Mouth: Mucous membranes are dry.     Pharynx: Oropharynx is clear.  Eyes:     Conjunctiva/sclera: Conjunctivae normal.     Pupils: Pupils are equal, round, and reactive to light.  Cardiovascular:     Rate and Rhythm: Tachycardia present.     Pulses: Normal pulses.     Heart sounds: Murmur heard.      Comments: 3/6 ESM over outflow tract Pulmonary:     Effort: Pulmonary effort is normal.     Breath sounds: Normal breath sounds. No rales.     Comments: Left-sided chest wall tenderness Chest:     Chest wall: Tenderness present.  Abdominal:     General: Abdomen is flat.     Palpations: Abdomen is soft.     Tenderness: There is abdominal tenderness. There is guarding.     Comments: Tenderness over lower quadrants  Musculoskeletal:     Cervical back: Normal range of motion and neck supple.  Skin:    General: Skin is warm and dry.     Findings: No erythema or rash.  Neurological:     Mental Status: He is alert.     Assessment/Plan: 1.  Abdominal pain/chest pain: Ongoing work-up.  Initial imaging significant for evidence of bilateral rib fractures that are old and healing.  CT scan of the abdomen and pelvis pending final report. 2.  End-stage renal disease: Missed hemodialysis over the past week with severe azotemia and mild hyperkalemia.  The plan is to undertake hemodialysis today at a lower than usual blood flow rate to avoid disequilibrium. 3.  Anemia: Concern raised with significantly elevated BUN and hemoglobin that has dropped from 11.5 (on 12/8) to 7.5 over the past 2 weeks as to whether this is evidence of an upper GI bleed.  He does have history of this in the past and may be related to his  alcoholic gastritis. 4.  Depression/alcohol abuse: We will need to discuss utility of antidepressant therapy in order to try and help him limit his alcohol intake. 5.  Secondary hyperparathyroidism: Resume phosphorus binder (Renvela 2400 mg 3 times daily) along with cinacalcet and calcitriol for PTH suppression. 6.  Hypertension: He is not excessively over his dry weight and actually appears to be without any significant volume overload-cautious ultrafiltration with dialysis. 7.  HIV infection: Resume Biktarvy 1 tablet daily.     Icesis Renn K. 10/16/2020, 9:14 PM

## 2020-10-17 ENCOUNTER — Encounter (HOSPITAL_COMMUNITY): Payer: Self-pay | Admitting: Internal Medicine

## 2020-10-17 DIAGNOSIS — E785 Hyperlipidemia, unspecified: Secondary | ICD-10-CM | POA: Diagnosis not present

## 2020-10-17 DIAGNOSIS — I5022 Chronic systolic (congestive) heart failure: Secondary | ICD-10-CM | POA: Diagnosis present

## 2020-10-17 DIAGNOSIS — N186 End stage renal disease: Secondary | ICD-10-CM | POA: Diagnosis not present

## 2020-10-17 DIAGNOSIS — R778 Other specified abnormalities of plasma proteins: Secondary | ICD-10-CM | POA: Diagnosis not present

## 2020-10-17 DIAGNOSIS — N2581 Secondary hyperparathyroidism of renal origin: Secondary | ICD-10-CM | POA: Diagnosis present

## 2020-10-17 DIAGNOSIS — K921 Melena: Secondary | ICD-10-CM

## 2020-10-17 DIAGNOSIS — E871 Hypo-osmolality and hyponatremia: Secondary | ICD-10-CM | POA: Diagnosis present

## 2020-10-17 DIAGNOSIS — E875 Hyperkalemia: Secondary | ICD-10-CM

## 2020-10-17 DIAGNOSIS — I132 Hypertensive heart and chronic kidney disease with heart failure and with stage 5 chronic kidney disease, or end stage renal disease: Secondary | ICD-10-CM | POA: Diagnosis not present

## 2020-10-17 DIAGNOSIS — I519 Heart disease, unspecified: Secondary | ICD-10-CM | POA: Diagnosis not present

## 2020-10-17 DIAGNOSIS — E1122 Type 2 diabetes mellitus with diabetic chronic kidney disease: Secondary | ICD-10-CM | POA: Diagnosis present

## 2020-10-17 DIAGNOSIS — K31819 Angiodysplasia of stomach and duodenum without bleeding: Secondary | ICD-10-CM | POA: Diagnosis not present

## 2020-10-17 DIAGNOSIS — Z9115 Patient's noncompliance with renal dialysis: Secondary | ICD-10-CM | POA: Diagnosis not present

## 2020-10-17 DIAGNOSIS — I200 Unstable angina: Secondary | ICD-10-CM | POA: Diagnosis not present

## 2020-10-17 DIAGNOSIS — D62 Acute posthemorrhagic anemia: Secondary | ICD-10-CM | POA: Diagnosis not present

## 2020-10-17 DIAGNOSIS — R079 Chest pain, unspecified: Secondary | ICD-10-CM | POA: Diagnosis not present

## 2020-10-17 DIAGNOSIS — Z21 Asymptomatic human immunodeficiency virus [HIV] infection status: Secondary | ICD-10-CM | POA: Diagnosis present

## 2020-10-17 DIAGNOSIS — Z955 Presence of coronary angioplasty implant and graft: Secondary | ICD-10-CM | POA: Diagnosis not present

## 2020-10-17 DIAGNOSIS — I340 Nonrheumatic mitral (valve) insufficiency: Secondary | ICD-10-CM | POA: Diagnosis not present

## 2020-10-17 DIAGNOSIS — K31811 Angiodysplasia of stomach and duodenum with bleeding: Secondary | ICD-10-CM | POA: Diagnosis not present

## 2020-10-17 DIAGNOSIS — E1140 Type 2 diabetes mellitus with diabetic neuropathy, unspecified: Secondary | ICD-10-CM | POA: Diagnosis present

## 2020-10-17 DIAGNOSIS — K922 Gastrointestinal hemorrhage, unspecified: Secondary | ICD-10-CM | POA: Diagnosis present

## 2020-10-17 DIAGNOSIS — K5521 Angiodysplasia of colon with hemorrhage: Secondary | ICD-10-CM | POA: Diagnosis not present

## 2020-10-17 DIAGNOSIS — Z20822 Contact with and (suspected) exposure to covid-19: Secondary | ICD-10-CM | POA: Diagnosis present

## 2020-10-17 DIAGNOSIS — F17210 Nicotine dependence, cigarettes, uncomplicated: Secondary | ICD-10-CM | POA: Diagnosis present

## 2020-10-17 DIAGNOSIS — D509 Iron deficiency anemia, unspecified: Secondary | ICD-10-CM | POA: Diagnosis not present

## 2020-10-17 DIAGNOSIS — I2510 Atherosclerotic heart disease of native coronary artery without angina pectoris: Secondary | ICD-10-CM | POA: Diagnosis not present

## 2020-10-17 DIAGNOSIS — I509 Heart failure, unspecified: Secondary | ICD-10-CM | POA: Diagnosis not present

## 2020-10-17 DIAGNOSIS — Z951 Presence of aortocoronary bypass graft: Secondary | ICD-10-CM | POA: Diagnosis not present

## 2020-10-17 DIAGNOSIS — I248 Other forms of acute ischemic heart disease: Secondary | ICD-10-CM | POA: Diagnosis not present

## 2020-10-17 DIAGNOSIS — D649 Anemia, unspecified: Secondary | ICD-10-CM | POA: Diagnosis not present

## 2020-10-17 DIAGNOSIS — Z992 Dependence on renal dialysis: Secondary | ICD-10-CM | POA: Diagnosis not present

## 2020-10-17 DIAGNOSIS — F32A Depression, unspecified: Secondary | ICD-10-CM | POA: Diagnosis present

## 2020-10-17 DIAGNOSIS — F1010 Alcohol abuse, uncomplicated: Secondary | ICD-10-CM | POA: Diagnosis present

## 2020-10-17 DIAGNOSIS — K5730 Diverticulosis of large intestine without perforation or abscess without bleeding: Secondary | ICD-10-CM | POA: Diagnosis present

## 2020-10-17 DIAGNOSIS — D696 Thrombocytopenia, unspecified: Secondary | ICD-10-CM | POA: Diagnosis present

## 2020-10-17 LAB — CBC
HCT: 19.9 % — ABNORMAL LOW (ref 39.0–52.0)
HCT: 21.3 % — ABNORMAL LOW (ref 39.0–52.0)
Hemoglobin: 6.5 g/dL — CL (ref 13.0–17.0)
Hemoglobin: 7 g/dL — ABNORMAL LOW (ref 13.0–17.0)
MCH: 31.9 pg (ref 26.0–34.0)
MCH: 31.4 pg (ref 26.0–34.0)
MCHC: 32.7 g/dL (ref 30.0–36.0)
MCHC: 32.9 g/dL (ref 30.0–36.0)
MCV: 97.5 fL (ref 80.0–100.0)
MCV: 95.5 fL (ref 80.0–100.0)
Platelets: 195 10*3/uL (ref 150–400)
Platelets: 166 10*3/uL (ref 150–400)
RBC: 2.04 MIL/uL — ABNORMAL LOW (ref 4.22–5.81)
RBC: 2.23 MIL/uL — ABNORMAL LOW (ref 4.22–5.81)
RDW: 16.5 % — ABNORMAL HIGH (ref 11.5–15.5)
RDW: 16.3 % — ABNORMAL HIGH (ref 11.5–15.5)
WBC: 7.5 10*3/uL (ref 4.0–10.5)
WBC: 7.5 10*3/uL (ref 4.0–10.5)
nRBC: 0 % (ref 0.0–0.2)
nRBC: 0 % (ref 0.0–0.2)

## 2020-10-17 LAB — GLUCOSE, CAPILLARY
Glucose-Capillary: 226 mg/dL — ABNORMAL HIGH (ref 70–99)
Glucose-Capillary: 150 mg/dL — ABNORMAL HIGH (ref 70–99)
Glucose-Capillary: 162 mg/dL — ABNORMAL HIGH (ref 70–99)
Glucose-Capillary: 122 mg/dL — ABNORMAL HIGH (ref 70–99)
Glucose-Capillary: 201 mg/dL — ABNORMAL HIGH (ref 70–99)

## 2020-10-17 LAB — ACETAMINOPHEN LEVEL: Acetaminophen (Tylenol), Serum: <10 ug/mL — ABNORMAL LOW (ref 10–30)

## 2020-10-17 LAB — TROPONIN I (HIGH SENSITIVITY)
Troponin I (High Sensitivity): 222 ng/L (ref <18)
Troponin I (High Sensitivity): 437 ng/L (ref <18)

## 2020-10-17 LAB — RENAL FUNCTION PANEL
Albumin: 2.8 g/dL — ABNORMAL LOW (ref 3.5–5.0)
Anion gap: 14 (ref 5–15)
BUN: 154 mg/dL — ABNORMAL HIGH (ref 8–23)
CO2: 21 mmol/L — ABNORMAL LOW (ref 22–32)
Calcium: 7.6 mg/dL — ABNORMAL LOW (ref 8.9–10.3)
Chloride: 93 mmol/L — ABNORMAL LOW (ref 98–111)
Creatinine, Ser: 6.76 mg/dL — ABNORMAL HIGH (ref 0.61–1.24)
GFR, Estimated: 8 mL/min — ABNORMAL LOW (ref >60)
Glucose, Bld: 296 mg/dL — ABNORMAL HIGH (ref 70–99)
Phosphorus: 5.6 mg/dL — ABNORMAL HIGH (ref 2.5–4.6)
Potassium: 4.2 mmol/L (ref 3.5–5.1)
Sodium: 128 mmol/L — ABNORMAL LOW (ref 135–145)

## 2020-10-17 LAB — CREATININE, SERUM
Creatinine, Ser: 8.61 mg/dL — ABNORMAL HIGH (ref 0.61–1.24)
GFR, Estimated: 6 mL/min — ABNORMAL LOW (ref >60)

## 2020-10-17 LAB — PREPARE RBC (CROSSMATCH)

## 2020-10-17 LAB — PROCALCITONIN: Procalcitonin: 1.04 ng/mL

## 2020-10-17 LAB — SALICYLATE LEVEL: Salicylate Lvl: <7 mg/dL — ABNORMAL LOW (ref 7.0–30.0)

## 2020-10-17 LAB — TSH: TSH: 0.748 u[IU]/mL (ref 0.350–4.500)

## 2020-10-17 LAB — LACTATE DEHYDROGENASE: LDH: 124 U/L (ref 98–192)

## 2020-10-17 MED ORDER — TRAMADOL HCL 50 MG PO TABS
ORAL_TABLET | ORAL | Status: AC
  Filled 2020-10-17: qty 1

## 2020-10-17 MED ORDER — BICTEGRAVIR-EMTRICITAB-TENOFOV 50-200-25 MG PO TABS
1.0000 | ORAL_TABLET | Freq: Every day | ORAL | Status: DC
  Administered 2020-10-17 – 2020-10-18 (×2): 1 via ORAL
  Filled 2020-10-17 (×3): qty 1

## 2020-10-17 MED ORDER — SODIUM CHLORIDE 0.9 % IV SOLN
100.0000 mg | Freq: Once | INTRAVENOUS | Status: AC
  Administered 2020-10-17: 11:00:00 100 mg via INTRAVENOUS
  Filled 2020-10-17 (×2): qty 100

## 2020-10-17 MED ORDER — LATANOPROST 0.005 % OP SOLN
1.0000 [drp] | Freq: Every day | OPHTHALMIC | Status: DC
  Administered 2020-10-17 – 2020-10-18 (×2): 1 [drp] via OPHTHALMIC
  Filled 2020-10-17 (×2): qty 2.5

## 2020-10-17 MED ORDER — ATORVASTATIN CALCIUM 40 MG PO TABS
40.0000 mg | ORAL_TABLET | Freq: Every day | ORAL | Status: DC
  Administered 2020-10-17 – 2020-10-18 (×3): 40 mg via ORAL
  Filled 2020-10-17 (×2): qty 1

## 2020-10-17 MED ORDER — ASPIRIN EC 81 MG PO TBEC
81.0000 mg | DELAYED_RELEASE_TABLET | Freq: Every day | ORAL | Status: DC
  Administered 2020-10-17: 05:00:00 81 mg via ORAL
  Filled 2020-10-17: qty 1

## 2020-10-17 MED ORDER — INSULIN ASPART 100 UNIT/ML ~~LOC~~ SOLN: 0.0000 [IU] | Freq: Three times a day (TID) | SUBCUTANEOUS | Status: DC

## 2020-10-17 MED ORDER — SODIUM CHLORIDE 0.9% IV SOLUTION: Freq: Once | INTRAVENOUS | Status: AC

## 2020-10-17 MED ORDER — NITROGLYCERIN 0.4 MG SL SUBL: 0.4000 mg | SUBLINGUAL_TABLET | SUBLINGUAL | Status: DC | PRN

## 2020-10-17 MED ORDER — TICAGRELOR 90 MG PO TABS
90.0000 mg | ORAL_TABLET | Freq: Two times a day (BID) | ORAL | Status: DC
  Administered 2020-10-17: 02:00:00 90 mg via ORAL
  Filled 2020-10-17: qty 1

## 2020-10-17 MED ORDER — PANTOPRAZOLE SODIUM 40 MG IV SOLR
40.0000 mg | Freq: Two times a day (BID) | INTRAVENOUS | Status: DC
  Administered 2020-10-17 – 2020-10-18 (×4): 40 mg via INTRAVENOUS
  Filled 2020-10-17 (×4): qty 40

## 2020-10-17 MED ORDER — LORAZEPAM 2 MG/ML IJ SOLN: 1.0000 mg | INTRAMUSCULAR | Status: DC | PRN

## 2020-10-17 MED ORDER — CARVEDILOL 3.125 MG PO TABS
3.1250 mg | ORAL_TABLET | Freq: Two times a day (BID) | ORAL | Status: DC
  Administered 2020-10-17 – 2020-10-18 (×4): 3.125 mg via ORAL
  Filled 2020-10-17 (×4): qty 1

## 2020-10-17 MED ORDER — UMECLIDINIUM-VILANTEROL 62.5-25 MCG/INH IN AEPB
1.0000 | INHALATION_SPRAY | Freq: Every day | RESPIRATORY_TRACT | Status: DC
  Administered 2020-10-17: 08:00:00 1 via RESPIRATORY_TRACT
  Filled 2020-10-17: qty 14

## 2020-10-17 MED ORDER — PANTOPRAZOLE SODIUM 40 MG PO TBEC: 40.0000 mg | DELAYED_RELEASE_TABLET | Freq: Every day | ORAL | Status: DC

## 2020-10-17 MED ORDER — SODIUM CHLORIDE 0.9 % IV SOLN
100.0000 mg | Freq: Two times a day (BID) | INTRAVENOUS | Status: DC
  Administered 2020-10-17 – 2020-10-19 (×4): 100 mg via INTRAVENOUS
  Filled 2020-10-17 (×6): qty 100

## 2020-10-17 MED ORDER — ADULT MULTIVITAMIN W/MINERALS CH: 1.0000 | ORAL_TABLET | Freq: Every day | ORAL | Status: DC

## 2020-10-17 MED ORDER — FOLIC ACID 1 MG PO TABS
1.0000 mg | ORAL_TABLET | Freq: Every day | ORAL | Status: DC
  Administered 2020-10-17 – 2020-10-18 (×2): 1 mg via ORAL
  Filled 2020-10-17 (×2): qty 1

## 2020-10-17 MED ORDER — SODIUM ZIRCONIUM CYCLOSILICATE 10 G PO PACK
10.0000 g | PACK | Freq: Once | ORAL | Status: AC
  Administered 2020-10-17: 05:00:00 10 g via ORAL
  Filled 2020-10-17: qty 1

## 2020-10-17 MED ORDER — GABAPENTIN 300 MG PO CAPS
300.0000 mg | ORAL_CAPSULE | Freq: Two times a day (BID) | ORAL | Status: DC
  Administered 2020-10-17 – 2020-10-18 (×4): 300 mg via ORAL
  Filled 2020-10-17 (×5): qty 1

## 2020-10-17 MED ORDER — LORAZEPAM 1 MG PO TABS: 1.0000 mg | ORAL_TABLET | ORAL | Status: DC | PRN

## 2020-10-17 MED ORDER — SODIUM CHLORIDE 0.9% IV SOLUTION: Freq: Once | INTRAVENOUS | Status: DC

## 2020-10-17 MED ORDER — DICLOFENAC SODIUM 1 % EX GEL
2.0000 g | Freq: Three times a day (TID) | CUTANEOUS | Status: DC | PRN
  Filled 2020-10-17: qty 100

## 2020-10-17 MED ORDER — THIAMINE HCL 100 MG PO TABS
100.0000 mg | ORAL_TABLET | Freq: Every day | ORAL | Status: DC
  Administered 2020-10-17 – 2020-10-18 (×2): 100 mg via ORAL
  Filled 2020-10-17 (×2): qty 1

## 2020-10-17 MED ORDER — HEPARIN SODIUM (PORCINE) 5000 UNIT/ML IJ SOLN
5000.0000 [IU] | Freq: Three times a day (TID) | INTRAMUSCULAR | Status: DC
  Filled 2020-10-17: qty 1

## 2020-10-17 MED ORDER — TRAMADOL HCL 50 MG PO TABS
50.0000 mg | ORAL_TABLET | Freq: Two times a day (BID) | ORAL | Status: DC | PRN
  Administered 2020-10-17 – 2020-10-18 (×2): 50 mg via ORAL
  Filled 2020-10-17: qty 1

## 2020-10-17 MED ORDER — ALLOPURINOL 100 MG PO TABS
100.0000 mg | ORAL_TABLET | Freq: Every day | ORAL | Status: DC
  Administered 2020-10-17: 09:00:00 100 mg via ORAL
  Filled 2020-10-17 (×2): qty 1

## 2020-10-17 MED ORDER — THIAMINE HCL 100 MG/ML IJ SOLN: 100.0000 mg | Freq: Every day | INTRAMUSCULAR | Status: DC

## 2020-10-17 MED ORDER — INSULIN ASPART 100 UNIT/ML ~~LOC~~ SOLN
0.0000 [IU] | SUBCUTANEOUS | Status: DC
  Administered 2020-10-17: 07:00:00 2 [IU] via SUBCUTANEOUS
  Administered 2020-10-17: 12:00:00 1 [IU] via SUBCUTANEOUS
  Administered 2020-10-17 – 2020-10-19 (×3): 2 [IU] via SUBCUTANEOUS

## 2020-10-17 MED ORDER — ISOSORBIDE MONONITRATE ER 30 MG PO TB24
30.0000 mg | ORAL_TABLET | Freq: Every day | ORAL | Status: DC
  Administered 2020-10-17 – 2020-10-18 (×2): 30 mg via ORAL
  Filled 2020-10-17 (×2): qty 1

## 2020-10-17 MED ORDER — TIMOLOL MALEATE 0.5 % OP SOLN
1.0000 [drp] | Freq: Every day | OPHTHALMIC | Status: DC
  Administered 2020-10-17 – 2020-10-18 (×2): 1 [drp] via OPHTHALMIC
  Filled 2020-10-17 (×2): qty 5

## 2020-10-17 MED ORDER — ASPIRIN 81 MG PO CHEW: 81.0000 mg | CHEWABLE_TABLET | Freq: Every day | ORAL | Status: DC

## 2020-10-17 NOTE — Progress Notes (Signed)
CRITICAL VALUE ALERT  Critical Value:  Hemoglobin 6.8  Date & Time Notied:  12/25 @0233   Provider Notified: Hilarie Fredrickson   Orders Received/Actions taken: Blood to be given during dialysis per MD

## 2020-10-17 NOTE — Progress Notes (Signed)
CRITICAL VALUE ALERT  Critical Value:  Troponin level 222  Date & Time Notied:  10/17/20 @ 0502  Provider Notified: Hal Hope   Orders Received/Actions taken: Give Asprin now.   Patient denied chest pain or SOB, no sign of distress noted, will give Asprin now per verbal order from [facility] MD

## 2020-10-17 NOTE — Consult Note (Signed)
Consultation  Referring Provider:     Dr. Hal Hope Primary Care Physician:  [name], NP Primary Gastroenterologist:        Scarlette Shorts MD Reason for Consultation:     Melena, anemia          HPI:   Jacob [name] is a 69 y.o. male with a history of CAD status post coronary stenting in February 2021 on Brilinta, history of end-stage renal disease on HD, history of recurrent GI bleeding due to upper tract AVMs, presenting to the hospital with chest discomfort, epigastric pain.  He has missed his last 3 hemodialysis sessions.  He endorses heavy alcohol drinking lately.  When asked about how much alcohol he is drinking he simply states "too much".  He states he has had some dark stools over the past 2 days.  He has a hard time quantifying exactly how many times this is happened, he thinks maybe twice.  In the ED his hemoglobin was 7.5.  3 months ago it was 8.1.  Overnight his hemoglobin dropped to 6.5 and he is now receiving a blood transfusion.  In the ER his troponins were 222 and this morning have increased to 437.  In the ED the patient was tachycardic with EKG showing ST depression and T wave inversions in the lateral leads.  He had a CT angiogram of the chest abdomen pelvis which showed severe narrowing of the origin of the superior mesenteric artery.  He also had vague peribronchovascular groundglass airspace opacities in the right lower lobe and left upper lobes.  A repeat CT was recommended in 3 to 6 months.  He also had some interval development of multiple renal cystic lesions.  Scattered sigmoid diverticulosis with no diverticulitis.  There is no active extravasation of bleeding.  He was hyperkalemic in the ED with a potassium of 6.2.  Nephrology has been consulted as well as cardiology.  He is currently receiving unit of blood.  Tested negative for Covid.  His last dose of Brilinta was about 2 days ago.  He has presented with upper tract bleeding and melena in the past.   Endoscopic evaluation as outlined below:   EGD 12/22/2019 - 4 gastric AVMs noted, 2 actively bleeding, all treated  EGD 02/08/2019 - 4-5 gastric/small bowel AVMs ablated.  It was recommended that he consider colonoscopy at that time which she adamantly declined  Colonoscopy 10/01/2012 - sigmoid diverticulosis, diminutive colon polyp removed  Past Medical History:  Diagnosis Date  . Acute respiratory failure ([place]) 03/01/2018  . Arthritis    "all over; mostly knees and back" (02/28/2018)  . Chronic lower back pain    stenosis  . Community acquired pneumonia 09/06/2013  . COPD (chronic obstructive pulmonary disease) ([place])   . Coronary atherosclerosis of native coronary artery    a. 02/2003 s/p CABG x 2 (VG->RI, VG->RPDA; b. 11/2019 PCI: LM nl, LAD 90d, D3 50, RI 100, LCX 100p, OM3 100 - fills via L->L collats from D2/dLAD, RCA 100p, VG->RPDA ok, VG->RI 95 (3.5x48 Synergy XD DES).  . Drug abuse ([place])    hx; tested for cocaine as recently as 2/08. says he is not using drugs now - avoided defib. for this reason   . ESRD (end stage renal disease) ([place])    Hemo M-W-F- Richarda Blade  . GERD (gastroesophageal reflux disease)    takes OTC meds as needed  . GI bleeding    a. 11/2019 EGD: angiodysplastic lesions w/ bleeding s/p  argon plasma/clipping/epi inj.  . Glaucoma    uses eye drops daily  . Hepatitis B 1968   "tx'd w/isolation; caught it from toilet stools in gym"  . HFrEF (heart failure with reduced ejection fraction) ([place])    a. 01/2019 Echo: EF 40-45%, diffuse HK, mild basal septal hypertrophy.  . History of blood transfusion 03/01/2019  . History of colon polyps    benign  . History of gout    takes Allopurinol daily as well as Colchicine-if needed (02/28/2018)  . History of kidney stones   . HTN (hypertension)    takes Coreg,Imdur.and Apresoline daily  . Human immunodeficiency virus (HIV) disease ([place]) dx'd 1995   takes Genvoya daily  . Hyperlipidemia    takes Atorvastatin daily  . Ischemic  cardiomyopathy    a. 01/2019 Echo: EF 40-45%, diffuse HK, mild basal septal hypertrophy. Diast dysfxn. Nl RV size/fxn. Sev dil LA. Triv MR/TR/PR.  [name] Muscle spasm    takes Zanaflex as needed  . Myocardial infarction ([place]) ~ 2004/2005  . Nocturia   . Peripheral neuropathy    takes gabapentin daily  . Pneumonia    "at least twice" (02/28/2018)  . Shortness of breath dyspnea    rarely but if notices it then with exertion  . Syphilis, unspecified   . Type II diabetes mellitus ([place]) 2004   Lantus daily.Average fasting blood sugar 125-199  . Wears glasses   . Wears partial dentures     Past Surgical History:  Procedure Laterality Date  . AV FISTULA PLACEMENT Left 08/02/2018   Procedure: ARTERIOVENOUS (AV) FISTULA CREATION  left arm radiocephlic;  Surgeon: Marty Heck, MD;  Location: [place];  Service: Vascular;  Laterality: Left;  . AV FISTULA PLACEMENT Left 08/01/2019   Procedure: LEFT BRACHIOCEPHALIC ARTERIOVENOUS (AV) FISTULA CREATION;  Surgeon: [name], MD;  Location: [facility];  Service: Vascular;  Laterality: Left;  . BASCILIC VEIN TRANSPOSITION Left 10/03/2019   Procedure: BASILIC VEIN TRANSPOSITION LEFT SECOND STAGE;  Surgeon: [name], MD;  Location: [place];  Service: Vascular;  Laterality: Left;  . CARDIAC CATHETERIZATION  10/2002; 12/19/2004   [name] 03/08/2011  . COLONOSCOPY  2013   Dr.John Henrene Pastor   . CORONARY ARTERY BYPASS GRAFT  02/24/2003   CABG X2/notes 03/08/2011  . CORONARY STENT INTERVENTION N/A 12/19/2019   Procedure: CORONARY STENT INTERVENTION;  Surgeon: Jettie Booze, MD;  Location: [facility];  Service: Cardiovascular;  Laterality: N/A;  . ESOPHAGOGASTRODUODENOSCOPY (EGD) WITH PROPOFOL N/A 02/08/2019   Procedure: ESOPHAGOGASTRODUODENOSCOPY (EGD) WITH PROPOFOL;  Surgeon: Milus Banister, MD;  Location: [facility];  Service: Gastroenterology;  Laterality: N/A;  . ESOPHAGOGASTRODUODENOSCOPY (EGD) WITH PROPOFOL N/A 12/22/2019   Procedure:  ESOPHAGOGASTRODUODENOSCOPY (EGD) WITH PROPOFOL;  Surgeon: Lavena Bullion, DO;  Location: [place];  Service: Gastroenterology;  Laterality: N/A;  . HEMOSTASIS CLIP PLACEMENT  12/22/2019   Procedure: HEMOSTASIS CLIP PLACEMENT;  Surgeon: Lavena Bullion, DO;  Location: [facility];  Service: Gastroenterology;;  . HOT HEMOSTASIS N/A 02/08/2019   Procedure: HOT HEMOSTASIS (ARGON PLASMA COAGULATION/BICAP);  Surgeon: Milus Banister, MD;  Location: [facility] ENDOSCOPY;  Service: Gastroenterology;  Laterality: N/A;  . HOT HEMOSTASIS N/A 12/22/2019   Procedure: HOT HEMOSTASIS (ARGON PLASMA COAGULATION/BICAP);  Surgeon: Lavena Bullion, DO;  Location: [facility] ENDOSCOPY;  Service: Gastroenterology;  Laterality: N/A;  . INTERTROCHANTERIC HIP FRACTURE SURGERY Left 11/2006   [name] 03/08/2011  . INTRAVASCULAR ULTRASOUND/IVUS N/A 12/19/2019   Procedure: Intravascular Ultrasound/IVUS;  Surgeon: Jettie Booze, MD;  Location: [place] [facility];  Service: Cardiovascular;  Laterality: N/A;  . IR FLUORO GUIDE CV LINE RIGHT  07/24/2019  . IR FLUORO GUIDE CV LINE RIGHT  07/30/2019  . IR US GUIDE VASC ACCESS RIGHT  07/24/2019  . IR US GUIDE VASC ACCESS RIGHT  07/30/2019  . LAPAROSCOPIC CHOLECYSTECTOMY  05/2006  . LIGATION OF COMPETING BRANCHES OF ARTERIOVENOUS FISTULA Left 11/05/2018   Procedure: LIGATION OF COMPETING BRANCHES OF ARTERIOVENOUS FISTULA  LEFT  ARM;  Surgeon: Marty Heck, MD;  Location: [facility];  Service: Vascular;  Laterality: Left;  . LUMBAR LAMINECTOMY/DECOMPRESSION MICRODISCECTOMY N/A 02/29/2016   Procedure: Left L4-5 Lateral Recess Decompression, Removal Extradural Intraspinal Facet Cyst;  Surgeon: Marybelle Killings, MD;  Location: [place];  Service: Orthopedics;  Laterality: N/A;  . MULTIPLE TOOTH EXTRACTIONS    . ORIF MANDIBULAR FRACTURE Left 08/13/2004   ORIF of left body fracture mandible with KLS Martin 2.3-mm six hole/notes 03/08/2011  . RIGHT/LEFT HEART CATH AND CORONARY/GRAFT ANGIOGRAPHY  N/A 12/19/2019   Procedure: RIGHT/LEFT HEART CATH AND CORONARY/GRAFT ANGIOGRAPHY;  Surgeon: Jettie Booze, MD;  Location: [place] [facility];  Service: Cardiovascular;  Laterality: N/A;  . SCLEROTHERAPY  12/22/2019   Procedure: SCLEROTHERAPY;  Surgeon: Lavena Bullion, DO;  Location: [facility] ENDOSCOPY;  Service: Gastroenterology;;    Family History  Problem Relation Age of Onset  . Heart failure Father   . Hypertension Father   . Diabetes Brother   . Heart attack Brother   . Alzheimer's disease Mother   . Stroke Sister   . Diabetes Sister   . Alzheimer's disease Sister   . Hypertension Brother   . Diabetes Brother   . Drug abuse Brother   . Colon cancer Neg Hx      Social History   Tobacco Use  . Smoking status: Current Every Day Smoker    Packs/day: 0.50    Years: 43.00    Pack years: 21.50    Types: Cigarettes  . Smokeless tobacco: Never Used  Vaping Use  . Vaping Use: Never used  Substance Use Topics  . Alcohol use: Yes    Alcohol/week: 12.0 standard drinks    Types: 12 Standard drinks or equivalent per week    Comment: occ  . Drug use: No    Types: Cocaine    Comment: hx of crack/cocaine 30yrs ago 10/01/2019- none    Prior to Admission medications   Medication Sig Start Date End Date Taking? Authorizing Provider  acetaminophen (TYLENOL) 650 MG CR tablet Take 650 mg by mouth every 8 (eight) hours as needed for pain.   Yes [provider]  allopurinol (ZYLOPRIM) 100 MG tablet TAKE 1 TABLET(100 MG) BY MOUTH DAILY Patient taking differently: Take 100 mg by mouth daily. TAKE 1 TABLET(100 MG) BY MOUTH DAILY 07/23/20  Yes Eubanks, [name], NP  ANORO ELLIPTA 62.5-25 MCG/INH AEPB INHALE 1 PUFF BY MOUTH EVERY DAY Patient taking differently: Inhale 1 puff into the lungs daily. 12/11/19  Yes [name], Dinah C, NP  B-D UF III MINI PEN NEEDLES 31G X 5 MM MISC USE FOUR TIMES DAILY 03/02/20  Yes [name], NP  bictegravir-emtricitabine-tenofovir AF (BIKTARVY)  50-200-25 MG TABS tablet Take 1 tablet by mouth daily. 06/30/20  Yes [name], MD  carvedilol (COREG) 3.125 MG tablet Take 1 tablet (3.125 mg total) by mouth 2 (two) times daily. 02/13/20  Yes [name], MD  Cinacalcet HCl (SENSIPAR PO) Take by mouth.  08/14/20 08/13/21 Yes [provider]  colchicine 0.6 MG tablet Take 0.6 mg by mouth daily as needed (pain).    Yes [provider]  diclofenac Sodium (VOLTAREN) 1 % GEL APPLY 2 GRAMS TOPICALLY TO THE AFFECTED AREA THREE TIMES DAILY AS NEEDED FOR PAIN Patient taking differently: Apply 2 g topically 3 (three) times daily as needed (pain). 08/17/20  Yes [name], NP  gabapentin (NEURONTIN) 300 MG capsule Take 1 capsule (300 mg total) by mouth 2 (two) times daily. 09/22/20  Yes [name], Dinah C, NP  insulin lispro (HUMALOG KWIKPEN) 100 UNIT/ML KwikPen Inject 0.31ml (20 Units total) into the skin 3 (three) times daily as needed for high blood sugar (above 150) 03/12/20  Yes [name], NP  iron sucrose in sodium chloride 0.9 % 100 mL Iron Sucrose (Venofer) 07/08/20 07/07/21 Yes [provider]  isosorbide mononitrate (IMDUR) 30 MG 24 hr tablet TAKE 1 TABLET BY MOUTH EVERY DAY Patient taking differently: Take 30 mg by mouth daily. TAKE 1 TABLET BY MOUTH EVERY DAY 08/10/20  Yes [name], NP  Lancets [facility] DELICA PLUS [id number]) MISC Inject 1 Device as directed 3 (three) times daily. Dx: E11.40 09/18/19  Yes [name], NP  latanoprost (XALATAN) 0.005 % ophthalmic solution Place 1 drop into both eyes at bedtime.   Yes [provider]  Methoxy PEG-Epoetin Beta (MIRCERA IJ) Mircera 07/06/20  Yes [provider]  multivitamin (RENA-VIT) TABS tablet Take 1 tablet by mouth daily.   Yes [provider]  nitroGLYCERIN (NITROSTAT) 0.3 MG SL tablet ONE TABLET UNDER TONGUE AS NEEDED FOR CHEST PAIN Patient taking differently: Place 0.3 mg under the tongue every 5 (five)  minutes as needed for chest pain. 05/01/19  Yes [name], MD  Nutritional Supplements (VITAMIN D BOOSTER PO) Take by mouth. 08/12/20 08/11/21 Yes [provider]  ONETOUCH ULTRA test strip CHECK BLOOD SUGAR 3 TIMES  DAILY 05/27/20  Yes [name], NP  pantoprazole (PROTONIX) 40 MG tablet TAKE 1 TABLET(40 MG) BY MOUTH DAILY Patient taking differently: Take 40 mg by mouth daily. 08/10/20  Yes [name], Dinah C, NP  sevelamer carbonate (RENVELA) 800 MG tablet Take 800 mg by mouth 3 (three) times daily with meals.   Yes [provider]  ticagrelor (BRILINTA) 90 MG TABS tablet Take 1 tablet (90 mg total) by mouth 2 (two) times daily. 01/17/20  Yes [name], MD  timolol (TIMOPTIC) 0.5 % ophthalmic solution Place 1 drop into both eyes every morning. 04/04/19  Yes [provider]  traMADol (ULTRAM) 50 MG tablet Take 1 tablet (50 mg total) by mouth every 12 (twelve) hours as needed. 10/09/20  Yes [name], NP    Current Facility-Administered Medications  Medication Dose Route Frequency Provider Last Rate Last Admin  . allopurinol (ZYLOPRIM) tablet 100 mg  100 mg Oral Daily Rise Patience, MD      . Derrill Memo ON 10/18/2020] aspirin chewable tablet 81 mg  81 mg Oral Daily Theora Gianotti, NP      . bictegravir-emtricitabine-tenofovir AF (BIKTARVY) 50-200-25 MG per tablet 1 tablet  1 tablet Oral Daily Rise Patience, MD      . Derrill Memo ON 10/19/2020] calcitRIOL (ROCALTROL) capsule 3 mcg  3 mcg Oral Q M,W,F-HD Rise Patience, MD      . carvedilol (COREG) tablet 3.125 mg  3.125 mg Oral BID WC Rise Patience, MD   3.125 mg at 10/17/20 4097  . Chlorhexidine Gluconate Cloth  2 % PADS 6 each  6 each Topical Q0600 Rise Patience, MD      . Derrill Memo ON 10/19/2020] cinacalcet (SENSIPAR) tablet 90 mg  90 mg Oral Q M,W,F-1800 Rise Patience, MD      . Derrill Memo ON 10/23/2020] Darbepoetin Alfa (ARANESP) injection 100 mcg  100 mcg  Intravenous Q Fri-HD Rise Patience, MD      . diclofenac Sodium (VOLTAREN) 1 % topical gel 2 g  2 g Topical TID PRN Rise Patience, MD      . doxycycline (VIBRAMYCIN) 100 mg in sodium chloride 0.9 % 250 mL IVPB  100 mg Intravenous Q12H Rise Patience, MD      . doxycycline (VIBRAMYCIN) 100 mg in sodium chloride 0.9 % 250 mL IVPB  100 mg Intravenous Once Rise Patience, MD      . folic acid (FOLVITE) tablet 1 mg  1 mg Oral Daily Rise Patience, MD      . gabapentin (NEURONTIN) capsule 300 mg  300 mg Oral BID Rise Patience, MD   300 mg at 10/17/20 0229  . insulin aspart (novoLOG) injection 0-6 Units  0-6 Units Subcutaneous Q4H Rise Patience, MD   2 Units at 10/17/20 0720  . isosorbide mononitrate (IMDUR) 24 hr tablet 30 mg  30 mg Oral Daily Rise Patience, MD      . latanoprost (XALATAN) 0.005 % ophthalmic solution 1 drop  1 drop Both Eyes QHS Rise Patience, MD      . LORazepam (ATIVAN) tablet 1-4 mg  1-4 mg Oral Q1H PRN Rise Patience, MD       Or  . LORazepam (ATIVAN) injection 1-4 mg  1-4 mg Intravenous Q1H PRN Rise Patience, MD      . multivitamin (RENA-VIT) tablet 1 tablet  1 tablet Oral QHS Rise Patience, MD   1 tablet at 10/16/20 2246  . multivitamin with minerals tablet 1 tablet  1 tablet Oral Daily Rise Patience, MD      . nitroGLYCERIN (NITROSTAT) SL tablet 0.4 mg  0.4 mg Sublingual Q5 min PRN Rise Patience, MD      . pantoprazole (PROTONIX) injection 40 mg  40 mg Intravenous Q12H Rise Patience, MD      . sevelamer carbonate (RENVELA) tablet 2,400 mg  2,400 mg Oral TID WC Rise Patience, MD      . thiamine tablet 100 mg  100 mg Oral Daily Rise Patience, MD       Or  . thiamine (B-1) injection 100 mg  100 mg Intravenous Daily Rise Patience, MD      . timolol (TIMOPTIC) 0.5 % ophthalmic solution 1 drop  1 drop Both Eyes Daily Rise Patience, MD      . traMADol  Veatrice Bourbon) tablet 50 mg  50 mg Oral Q12H PRN Rise Patience, MD      . umeclidinium-vilanterol [facility] ELLIPTA) 62.5-25 MCG/INH 1 puff  1 puff Inhalation Daily Rise Patience, MD   1 puff at 10/17/20 1517    Allergies as of 10/16/2020 - Review Complete 10/16/2020  Allergen Reaction Noted  . Augmentin [amoxicillin-pot clavulanate] Diarrhea 12/05/2018  . Amphetamines Other (See Comments) 01/15/2012     Review of Systems:    As per HPI, otherwise negative    Physical Exam:  Vital signs in last 24 hours: Temp:  [97.5 F (36.4 C)-98.4 F (36.9 C)] 97.6 F (36.4 C) (12/25 0743)  Pulse Rate:  [96-124] 107 (12/25 0743) Resp:  [13-22] 18 (12/25 0827) BP: (138-172)/(51-73) 164/69 (12/25 0743) SpO2:  [97 %-100 %] 98 % (12/25 0743) Weight:  [76.6 kg-81.6 kg] 76.6 kg (12/25 0445) Last BM Date: 10/16/20 General:   Pleasant male in NAD Head:  Normocephalic and atraumatic. Eyes:   No icterus.   Conjunctiva pale Ears:  Normal auditory acuity. Neck:  Supple Lungs:  respirations even and unlabored. Heart:  Regular rate and rhythm; tachycardic to low 100s Abdomen:  Soft, nondistended, nontender.  Rectal:  Not performed.  Msk:  Symmetrical without gross deformities.  Extremities:  Without edema. Neurologic:  Alert and  oriented x4;  grossly normal neurologically. Skin:  Intact without significant lesions or rashes. Psych:  Alert and cooperative. Normal affect.  LAB RESULTS: Recent Labs    10/16/20 1807 10/17/20 0139  WBC 10.1 7.5  HGB 7.5* 6.5*  HCT 22.0* 19.9*  PLT 188 195   BMET Recent Labs    10/16/20 1807 10/17/20 0139  NA 133*  --   K 6.2*  --   CL 95*  --   CO2 13*  --   GLUCOSE 197*  --   BUN 203*  --   CREATININE 8.92* 8.61*  CALCIUM 8.0*  --    LFT Recent Labs    10/16/20 1807  PROT 7.0  ALBUMIN 3.3*  AST 32  ALT 20  ALKPHOS 80  BILITOT 0.4   PT/INR No results for input(s): LABPROT, INR in the last 72 hours.  STUDIES: DG Chest Port 1  View  Result Date: 10/16/2020 CLINICAL DATA:  Shortness of breath. EXAM: PORTABLE CHEST 1 VIEW COMPARISON:  Chest x-ray 03/11/2020 FINDINGS: Slightly more prominent cardiac silhouette likely due to AP portable technique. Otherwise the heart size and mediastinal contours are unchanged. Aortic arch calcifications. Marked calcifications of the major vessels originating from the aortic arch. Coronary artery stent. No focal consolidation. No pulmonary edema. No pleural effusion. No pneumothorax. No acute osseous abnormality. Old left rib fractures. Old right rib fractures. IMPRESSION: No active disease. Electronically Signed   By: Iven Finn M.D.   On: 10/16/2020 19:16   CT Angio Chest/Abd/Pel for Dissection W and/or Wo Contrast  Result Date: 10/16/2020 CLINICAL DATA:  Abdominal pain, aortic dissection suspected. EXAM: CT ANGIOGRAPHY CHEST, ABDOMEN AND PELVIS TECHNIQUE: Non-contrast CT of the chest was initially obtained. Multidetector CT imaging through the chest, abdomen and pelvis was performed using the standard protocol during bolus administration of intravenous contrast. Multiplanar reconstructed images and MIPs were obtained and reviewed to evaluate the vascular anatomy. CONTRAST:  139mL OMNIPAQUE IOHEXOL 350 MG/ML SOLN COMPARISON:  CT abdomen pelvis 11/03/2015, CT abdomen pelvis 06/14/2006 FINDINGS: VASCULAR Aorta: Moderate to severe calcified and noncalcified atherosclerotic plaque. Normal caliber aorta without aneurysm, dissection, vasculitis or significant stenosis. Celiac: Mild atherosclerotic plaque. Patent without evidence of aneurysm, dissection, vasculitis or significant stenosis. SMA: Severe atherosclerotic plaque with severe narrowing of the origin. Patent without evidence of aneurysm, dissection, vasculitis. Renals: At least moderate atherosclerotic plaque with mild to moderate stenosis of the origins. Both renal arteries are patent without evidence of aneurysm, dissection, vasculitis,  fibromuscular dysplasia or significant stenosis. IMA: Patent without evidence of aneurysm, dissection, vasculitis or significant stenosis. Inflow: Moderate to severe atherosclerotic plaque. Patent without evidence of aneurysm, dissection, vasculitis or significant stenosis. Veins: No obvious venous abnormality within the limitations of this arterial phase study. Review of the MIP images confirms the above findings. NON-VASCULAR CTA CHEST FINDINGS Cardiovascular: Preferential opacification of  the thoracic aorta. No evidence of thoracic aortic aneurysm or dissection. Normal heart size. No significant pericardial effusion. At least moderate four-vessel coronary artery calcifications status post coronary artery bypass. The main pulmonary artery is normal in caliber. No central pulmonary embolus. Mediastinum/Nodes: No enlarged mediastinal, hilar, or axillary lymph nodes. Thyroid gland, trachea, and esophagus demonstrate no significant findings. Lungs/Pleura: Right lower lobe scattered peribronchovascular ground-glass airspace opacities (6:35, 47, 49). Nodular-like region within the right lower lobe measures up to 1.3 cm. Similar finding within left upper lobe (6:25). Nodular-ike region within the left upper lobe measures up to 7 mm. No pulmonary mass. No pleural effusion. No pneumothorax. Musculoskeletal: No chest wall abnormality No suspicious lytic or blastic osseous lesions. No acute displaced fracture. CTA ABDOMEN AND PELVIS FINDINGS Hepatobiliary: No focal liver abnormality is seen. Status post cholecystectomy. No biliary dilatation. Pancreas: No focal lesion. Normal pancreatic contour. No surrounding inflammatory changes. No main pancreatic ductal dilatation. Spleen: Normal in size without focal abnormality. Adrenals/Urinary Tract: No adrenal nodule bilaterally. Bilateral kidneys enhance symmetrically. Lobulated renal parenchyma with associated scarring and suggestion of innumerable cystic lesions. Suggestion of  ill-defined underlying renal lesions of which measure simple fluid (7:164, 161). Subcentimeter hypodensities are too small to characterize. No hydronephrosis. No hydroureter. The urinary bladder is unremarkable. Stomach/Bowel: Stomach is within normal limits. No evidence of bowel wall thickening or dilatation. Scattered sigmoid diverticulosis. Appendix appears normal. Lymphatic: No abdominal, pelvic, or inguinal lymphadenopathy. Reproductive: Prostate is unremarkable. Other: No intraperitoneal free fluid. No intraperitoneal free gas. No organized fluid collection. Musculoskeletal: Small fat containing left inguinal hernia. Possible tiny fat containing right inguinal hernia. No suspicious lytic or blastic osseous lesions. No acute displaced fracture. Old healed left rib fractures. Old healed left rib fractures. Multilevel degenerative changes of the spine with intervertebral disc space narrowing and vacuum phenomenon at the L4-L5 and L5-S1 levels. Trace retrolisthesis of L5 on S1. Review of the MIP images confirms the above findings. IMPRESSION: VASCULAR: 1. No aortic aneurysm or dissection. Aortic Atherosclerosis (ICD10-I70.0). 2. 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 NON-VASCULAR: 1. Vague peribronchovascular ground-glass airspace opacities, some of which are nodular-like, within the right lower lobe and left upper lobes. Findings could represent infection/inflammation with COVID 19 infection not excluded. Non-contrast chest CT at 3-6 months is recommended. If the nodules are stable at time of repeat CT, then future CT at 18-24 months (from today's scan) is considered optional for low-risk patients, but is recommended for high-risk patients. This recommendation follows the consensus statement: Guidelines for Management of Incidental Pulmonary Nodules Detected on CT Images: From the Fleischner Society 2017; Radiology 2017;  284:228-243. 2. Interval development of multiple renal cystic lesions that are poorly visualized/evaluated. Recommend dedicated cross-sectional imaging renal protocol for further evaluation. 3. Scattered sigmoid diverticulosis with no acute diverticulitis. 4. Bilateral fat containing inguinal hernias. Electronically Signed   By: Iven Finn M.D.   On: 10/16/2020 21:10     Impression / Plan:   69 year old male with a history of CAD with coronary stenting in February, on Brilinta, history of end-stage renal disease on HD, missing last 3 dialysis sessions, recent alcohol use, complaining of chest pain, abdominal pain and melena for 2 days.  He has a history of gastric and duodenal AVMs.  Currently receiving a unit of blood for hemoglobin that dropped in the sixes.  It does not appear that he is having significantly active bleeding right now, sounds like this is been  going on for a few days.  I do think he warrants an upper endoscopy to clear his upper tract, suspect he likely has recurrent AVM bleeding in the setting of Brilinta.  However given his chest pain, troponin elevation, hyperkalemia, he warrants cardiology evaluation first to make sure he is not having ACS (although this very well could be demand ischemia in the setting of anemia), and nephrology evaluation to determine timing of dialysis.  I would continue IV PPI for now, clear liquid diet okay today as long as he does not have significant blood loss.  We will tentatively plan for EGD in the next 1 to 2 days pending his cardiac evaluation and his course.  If he has significant bleeding in the interim we can do it sooner, please notify me the situation.  I would continue to hold Brilinta if okay with cardiology.  Recommend: - IV protonix 40mg  BID - clear liquid diet okay for now, NPO after MN in case EGD is needed tomorrow - EGD to be done at some point in time, will await cardiology / nephrology evaluation first to determine best timing -  trend Hgb post transfusion. Monitor for recurrent bleeding.  We will reassess him in the AM, call in the interim if active / significant bleeding symptoms.   Nunda Cellar, MD [facility]

## 2020-10-17 NOTE — Progress Notes (Signed)
[name], male   DOB: [birth year], 69 y.o.   MRN: [id number]  [facility] Progress Note   Assessment/ Plan:   1.  Abdominal pain/chest pain:  Chest pain is apparently resolved and seen earlier by cardiology who support holding his Brilinta in the setting of GI bleed.  His troponin level is slightly elevated in the setting of demand ischemia in a patient with coronary artery disease status post CABG/PTCA. 2.  End-stage renal disease: Missed hemodialysis over the past week with severe azotemia and mild hyperkalemia.    Not get hemodialysis last night and is on schedule for dialysis today.. 3.  GI bleed with acute blood loss Anemia: His hemoglobin level has dropped from 11.5 (on 12/8) to 7.5 yesterday and is even lower down to 6.5 today.  He has received 1 unit PRBC with plans to transfuse another unit on hemodialysis later today.  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  Depression/alcohol abuse: Discussed alcohol cessation and will likely need referral for consideration of antidepressant therapy/counseling as an outpatient. 5.  Secondary hyperparathyroidism: Resume phosphorus binder (Renvela 2400 mg 3 times daily) along with cinacalcet and calcitriol for PTH suppression. 6.  Hypertension:  Resumed oral antihypertensive therapy with cautious ultrafiltration with dialysis. 7.  HIV infection: Resumed back on Biktarvy 1 tablet daily.  Subjective:   Reports a long night with difficulty resting and inquires whether he can simply go home and go to his dialysis unit tomorrow.  Unable to get dialysis overnight due to high patient volume/staff ratio and on schedule for dialysis today.   Objective:   BP (!) 166/75   Pulse 93   Temp (!) 97.3 F (36.3 C) (Axillary)   Resp 17   Ht 6\' 2"  (1.88 m)   Wt 76.6 kg   SpO2 97%   BMI 21.68 kg/m   Physical Exam: Gen: Appears  uncomfortable/fatigued resting in bed.  Appears chronically ill. CVS: Pulse regular rhythm, normal rate, 3/6 ESM over outflow tract. Resp: Decreased breath sounds over bases, no distinct rales or rhonchi Abd: Soft, flat, mild tenderness over epigastric area. Ext: Left brachiocephalic fistula with palpable thrill.  No pedal edema.  Labs: BMET Recent Labs  Lab 10/16/20 1807 10/17/20 0139  NA 133*  --   K 6.2*  --   CL 95*  --   CO2 13*  --   GLUCOSE 197*  --   BUN 203*  --   CREATININE 8.92* 8.61*  CALCIUM 8.0*  --    CBC Recent Labs  Lab 10/16/20 1807 10/17/20 0139  WBC 10.1 7.5  HGB 7.5* 6.5*  HCT 22.0* 19.9*  MCV 96.1 97.5  PLT 188 195      Medications:    . allopurinol  100 mg Oral Daily  . [START ON 10/18/2020] aspirin  81 mg Oral Daily  . atorvastatin  40 mg Oral Daily  . bictegravir-emtricitabine-tenofovir AF  1 tablet Oral Daily  . [START ON 10/19/2020] calcitRIOL  3 mcg Oral Q M,W,F-HD  . carvedilol  3.125 mg Oral BID WC  . Chlorhexidine Gluconate Cloth  6 each Topical Q0600  . [START ON 10/19/2020] cinacalcet  90 mg Oral Q M,W,F-1800  . [START ON 10/23/2020] darbepoetin (ARANESP) injection - DIALYSIS  100 mcg Intravenous Q Fri-HD  . folic acid  1 mg Oral Daily  . gabapentin  300 mg Oral BID  . insulin aspart  0-6 Units Subcutaneous Q4H  . isosorbide mononitrate  30 mg Oral Daily  . latanoprost  1 drop Both Eyes QHS  . multivitamin  1 tablet Oral QHS  . pantoprazole (PROTONIX) IV  40 mg Intravenous Q12H  . sevelamer carbonate  2,400 mg Oral TID WC  . thiamine  100 mg Oral Daily   Or  . thiamine  100 mg Intravenous Daily  . timolol  1 drop Both Eyes Daily  . umeclidinium-vilanterol  1 puff Inhalation Daily   Elmarie Shiley, MD 10/17/2020, 11:38 AM

## 2020-10-17 NOTE — H&P (Addendum)
History and Physical    Jacob [name] MLY:[id number] DOB: [birth year] DOA: 10/16/2020  PCP: [name], NP  Patient coming from: Home.  Chief Complaint: Missed dialysis.  HPI: Jacob [name] is a 69 y.o. male with history of CAD status post stenting last 1 was in February 2021 presently on Brilinta which patient has not taken for last couple of days with history of ESRD on hemodialysis missed last 3 sessions patient is also depressed and has been drinking a lot of alcohol last 1 week presents to the ER with complaints of having missed dialysis.  In addition patient also states he has been having some left-sided chest pain and abdominal pain mostly in the epigastric area.  Denies any shortness of breath productive cough fever or chills.  Denies overdosing with any of the medications.  Denies overdosing with any over-the-counter medications.  Denies taking any cocaine recently.  ED Course: In the ER patient was tachycardic with EKG showing ST depression and T wave inversions in the lateral leads.  Initial troponin was 68 and second on a 73.  Hemoglobin is around 7.5 potassium 6.2 bicarb 13 given the patient's symptoms of chest pain and abdominal pain CT angiogram of the chest abdomen pelvis was done which shows nonspecific peribronchial groundglass opacities in the right lower lobe and left upper lobe.  There is also severe narrowing of the superior mesenteric artery.  On-call nephrologist Dr. Graylon Gunning was consulted.  At this time Dr. Posey Pronto has arranged for dialysis and also given D50 and IV insulin and calcium gluconate.  At the time of my exam patient is chest pain-free.  Covid test is negative.  Patient remains tachycardic.  Review of Systems: As per HPI, rest all negative.   Past Medical History:  Diagnosis Date  . Acute respiratory failure ([place]) 03/01/2018  . Arthritis    "all over; mostly knees and back" (02/28/2018)  . CHF (congestive heart failure) (HCC)    not on any meds  .  Chronic lower back pain    stenosis  . Community acquired pneumonia 09/06/2013  . COPD (chronic obstructive pulmonary disease) ([place])   . Coronary atherosclerosis of native coronary artery 2005   s/p surgery  . Drug abuse ([place])    hx; tested for cocaine as recently as 2/08. says he is not using drugs now - avoided defib. for this reason   . ESRD (end stage renal disease) ([place])    Hemo M-W-F- Richarda Blade  . GERD (gastroesophageal reflux disease)    takes OTC meds as needed  . GI bleeding 02/06/2019  . Glaucoma    uses eye drops daily  . Hepatitis B 1968   "tx'd w/isolation; caught it from toilet stools in gym"  . History of blood transfusion 03/01/2019  . History of colon polyps    benign  . History of gout    takes Allopurinol daily as well as Colchicine-if needed (02/28/2018)  . History of kidney stones   . HTN (hypertension)    takes Coreg,Imdur.and Apresoline daily  . Human immunodeficiency virus (HIV) disease ([place]) dx'd 1995   takes Genvoya daily  . Hyperlipidemia    takes Atorvastatin daily  . Ischemic cardiomyopathy   . Muscle spasm    takes Zanaflex as needed  . Myocardial infarction ([place]) ~ 2004/2005  . Nocturia   . Peripheral neuropathy    takes gabapentin daily  . Pneumonia    "at least twice" (02/28/2018)  . Shortness of breath dyspnea  rarely but if notices it then with exertion  . Syphilis, unspecified   . Type II diabetes mellitus ([place]) 2004   Lantus daily.Average fasting blood sugar 125-199  . Wears glasses   . Wears partial dentures     Past Surgical History:  Procedure Laterality Date  . AV FISTULA PLACEMENT Left 08/02/2018   Procedure: ARTERIOVENOUS (AV) FISTULA CREATION  left arm radiocephlic;  Surgeon: Marty Heck, MD;  Location: [facility];  Service: Vascular;  Laterality: Left;  . AV FISTULA PLACEMENT Left 08/01/2019   Procedure: LEFT BRACHIOCEPHALIC ARTERIOVENOUS (AV) FISTULA CREATION;  Surgeon: [name], MD;  Location: [facility];  Service:  Vascular;  Laterality: Left;  . BASCILIC VEIN TRANSPOSITION Left 10/03/2019   Procedure: BASILIC VEIN TRANSPOSITION LEFT SECOND STAGE;  Surgeon: [name], MD;  Location: [facility];  Service: Vascular;  Laterality: Left;  . CARDIAC CATHETERIZATION  10/2002; 12/19/2004   [name] 03/08/2011  . COLONOSCOPY  2013   Dr.John Henrene Pastor   . CORONARY ARTERY BYPASS GRAFT  02/24/2003   CABG X2/notes 03/08/2011  . CORONARY STENT INTERVENTION N/A 12/19/2019   Procedure: CORONARY STENT INTERVENTION;  Surgeon: Jettie Booze, MD;  Location: [facility];  Service: Cardiovascular;  Laterality: N/A;  . ESOPHAGOGASTRODUODENOSCOPY (EGD) WITH PROPOFOL N/A 02/08/2019   Procedure: ESOPHAGOGASTRODUODENOSCOPY (EGD) WITH PROPOFOL;  Surgeon: Milus Banister, MD;  Location: [facility];  Service: Gastroenterology;  Laterality: N/A;  . ESOPHAGOGASTRODUODENOSCOPY (EGD) WITH PROPOFOL N/A 12/22/2019   Procedure: ESOPHAGOGASTRODUODENOSCOPY (EGD) WITH PROPOFOL;  Surgeon: Lavena Bullion, DO;  Location: [facility];  Service: Gastroenterology;  Laterality: N/A;  . HEMOSTASIS CLIP PLACEMENT  12/22/2019   Procedure: HEMOSTASIS CLIP PLACEMENT;  Surgeon: Lavena Bullion, DO;  Location: [facility];  Service: Gastroenterology;;  . HOT HEMOSTASIS N/A 02/08/2019   Procedure: HOT HEMOSTASIS (ARGON PLASMA COAGULATION/BICAP);  Surgeon: Milus Banister, MD;  Location: [facility] ENDOSCOPY;  Service: Gastroenterology;  Laterality: N/A;  . HOT HEMOSTASIS N/A 12/22/2019   Procedure: HOT HEMOSTASIS (ARGON PLASMA COAGULATION/BICAP);  Surgeon: Lavena Bullion, DO;  Location: [facility] ENDOSCOPY;  Service: Gastroenterology;  Laterality: N/A;  . INTERTROCHANTERIC HIP FRACTURE SURGERY Left 11/2006   [name] 03/08/2011  . INTRAVASCULAR ULTRASOUND/IVUS N/A 12/19/2019   Procedure: Intravascular Ultrasound/IVUS;  Surgeon: Jettie Booze, MD;  Location: [place] [facility];  Service: Cardiovascular;  Laterality: N/A;  . IR FLUORO GUIDE CV LINE RIGHT   07/24/2019  . IR FLUORO GUIDE CV LINE RIGHT  07/30/2019  . IR US GUIDE VASC ACCESS RIGHT  07/24/2019  . IR US GUIDE VASC ACCESS RIGHT  07/30/2019  . LAPAROSCOPIC CHOLECYSTECTOMY  05/2006  . LIGATION OF COMPETING BRANCHES OF ARTERIOVENOUS FISTULA Left 11/05/2018   Procedure: LIGATION OF COMPETING BRANCHES OF ARTERIOVENOUS FISTULA  LEFT  ARM;  Surgeon: Marty Heck, MD;  Location: [facility];  Service: Vascular;  Laterality: Left;  . LUMBAR LAMINECTOMY/DECOMPRESSION MICRODISCECTOMY N/A 02/29/2016   Procedure: Left L4-5 Lateral Recess Decompression, Removal Extradural Intraspinal Facet Cyst;  Surgeon: Marybelle Killings, MD;  Location: [facility];  Service: Orthopedics;  Laterality: N/A;  . MULTIPLE TOOTH EXTRACTIONS    . ORIF MANDIBULAR FRACTURE Left 08/13/2004   ORIF of left body fracture mandible with KLS Martin 2.3-mm six hole/notes 03/08/2011  . RIGHT/LEFT HEART CATH AND CORONARY/GRAFT ANGIOGRAPHY N/A 12/19/2019   Procedure: RIGHT/LEFT HEART CATH AND CORONARY/GRAFT ANGIOGRAPHY;  Surgeon: Jettie Booze, MD;  Location: [facility];  Service: Cardiovascular;  Laterality: N/A;  . SCLEROTHERAPY  12/22/2019  Procedure: SCLEROTHERAPY;  Surgeon: Lavena Bullion, DO;  Location: [facility] ENDOSCOPY;  Service: Gastroenterology;;     reports that he has been smoking cigarettes. He has a 21.50 pack-year smoking history. He has never used smokeless tobacco. He reports current alcohol use of about 12.0 standard drinks of alcohol per week. He reports that he does not use drugs.  Allergies  Allergen Reactions  . Augmentin [Amoxicillin-Pot Clavulanate] Diarrhea    Severe  . Amphetamines Other (See Comments)    Unknown reaction type    Family History  Problem Relation Age of Onset  . Heart failure Father   . Hypertension Father   . Diabetes Brother   . Heart attack Brother   . Alzheimer's disease Mother   . Stroke Sister   . Diabetes Sister   . Alzheimer's disease Sister   . Hypertension Brother   .  Diabetes Brother   . Drug abuse Brother   . Colon cancer Neg Hx     Prior to Admission medications   Medication Sig Start Date End Date Taking? Authorizing Provider  acetaminophen (TYLENOL) 650 MG CR tablet Take 650 mg by mouth every 8 (eight) hours as needed for pain.   Yes [provider]  allopurinol (ZYLOPRIM) 100 MG tablet TAKE 1 TABLET(100 MG) BY MOUTH DAILY Patient taking differently: Take 100 mg by mouth daily. TAKE 1 TABLET(100 MG) BY MOUTH DAILY 07/23/20  Yes Eubanks, [name], NP  ANORO ELLIPTA 62.5-25 MCG/INH AEPB INHALE 1 PUFF BY MOUTH EVERY DAY Patient taking differently: Inhale 1 puff into the lungs daily. 12/11/19  Yes [name], Dinah C, NP  B-D UF III MINI PEN NEEDLES 31G X 5 MM MISC USE FOUR TIMES DAILY 03/02/20  Yes [name], NP  bictegravir-emtricitabine-tenofovir AF (BIKTARVY) 50-200-25 MG TABS tablet Take 1 tablet by mouth daily. 06/30/20  Yes [name], MD  carvedilol (COREG) 3.125 MG tablet Take 1 tablet (3.125 mg total) by mouth 2 (two) times daily. 02/13/20  Yes [name], MD  Cinacalcet HCl (SENSIPAR PO) Take by mouth.  08/14/20 08/13/21 Yes [provider]  colchicine 0.6 MG tablet Take 0.6 mg by mouth daily as needed (pain).    Yes [provider]  diclofenac Sodium (VOLTAREN) 1 % GEL APPLY 2 GRAMS TOPICALLY TO THE AFFECTED AREA THREE TIMES DAILY AS NEEDED FOR PAIN Patient taking differently: Apply 2 g topically 3 (three) times daily as needed (pain). 08/17/20  Yes [name], NP  gabapentin (NEURONTIN) 300 MG capsule Take 1 capsule (300 mg total) by mouth 2 (two) times daily. 09/22/20  Yes [name], Dinah C, NP  insulin lispro (HUMALOG KWIKPEN) 100 UNIT/ML KwikPen Inject 0.74ml (20 Units total) into the skin 3 (three) times daily as needed for high blood sugar (above 150) 03/12/20  Yes [name], NP  iron sucrose in sodium chloride 0.9 % 100 mL Iron Sucrose (Venofer) 07/08/20 07/07/21 Yes [provider]   isosorbide mononitrate (IMDUR) 30 MG 24 hr tablet TAKE 1 TABLET BY MOUTH EVERY DAY Patient taking differently: Take 30 mg by mouth daily. TAKE 1 TABLET BY MOUTH EVERY DAY 08/10/20  Yes [name], NP  Lancets [facility] DELICA PLUS [id number]) MISC Inject 1 Device as directed 3 (three) times daily. Dx: E11.40 09/18/19  Yes [name], NP  latanoprost (XALATAN) 0.005 % ophthalmic solution Place 1 drop into both eyes at bedtime.   Yes [provider]  Methoxy PEG-Epoetin Beta (MIRCERA IJ) Mircera 07/06/20  Yes [provider]  multivitamin (RENA-VIT) TABS tablet Take 1 tablet by mouth daily.   Yes [provider]  nitroGLYCERIN (NITROSTAT) 0.3 MG SL tablet ONE TABLET UNDER TONGUE AS NEEDED FOR CHEST PAIN Patient taking differently: Place 0.3 mg under the tongue every 5 (five) minutes as needed for chest pain. 05/01/19  Yes [name], MD  Nutritional Supplements (VITAMIN D BOOSTER PO) Take by mouth. 08/12/20 08/11/21 Yes [provider]  ONETOUCH ULTRA test strip CHECK BLOOD SUGAR 3 TIMES  DAILY 05/27/20  Yes [name], NP  pantoprazole (PROTONIX) 40 MG tablet TAKE 1 TABLET(40 MG) BY MOUTH DAILY Patient taking differently: Take 40 mg by mouth daily. 08/10/20  Yes [name], Dinah C, NP  sevelamer carbonate (RENVELA) 800 MG tablet Take 800 mg by mouth 3 (three) times daily with meals.   Yes [provider]  ticagrelor (BRILINTA) 90 MG TABS tablet Take 1 tablet (90 mg total) by mouth 2 (two) times daily. 01/17/20  Yes [name], MD  timolol (TIMOPTIC) 0.5 % ophthalmic solution Place 1 drop into both eyes every morning. 04/04/19  Yes [provider]  traMADol (ULTRAM) 50 MG tablet Take 1 tablet (50 mg total) by mouth every 12 (twelve) hours as needed. 10/09/20  Yes [name], NP    Physical Exam: Constitutional: Moderately built and nourished. Vitals:   10/16/20 2215 10/16/20 2305 10/16/20 2338 10/17/20 0017   BP: (!) 141/60  (!) 171/54 (!) 154/69  Pulse:    (!) 113  Resp: 16  18   Temp:  (!) 97.5 F (36.4 C) 97.9 F (36.6 C)   TempSrc:  Oral Oral   SpO2:   99% 98%  Weight:   76.6 kg   Height:   6\' 2"  (1.88 m)    Eyes: Anicteric no pallor. ENMT: No discharge from the ears eyes nose or mouth. Neck: No mass felt.  No neck rigidity. Respiratory: No rhonchi or crepitations. Cardiovascular: S1-S2 heard tachycardic. Abdomen: Soft nontender bowel sounds present. Musculoskeletal: No edema. Skin: No rash. Neurologic: Alert awake oriented to time place and person.  Moves all extremities. Psychiatric: Appears normal.  Normal affect.   Labs on Admission: I have personally reviewed following labs and imaging studies  CBC: Recent Labs  Lab 10/16/20 1807  WBC 10.1  HGB 7.5*  HCT 22.0*  MCV 96.1  PLT 638   Basic Metabolic Panel: Recent Labs  Lab 10/16/20 1807  NA 133*  K 6.2*  CL 95*  CO2 13*  GLUCOSE 197*  BUN 203*  CREATININE 8.92*  CALCIUM 8.0*   GFR: Estimated Creatinine Clearance: 8.2 mL/min (A) (by C-G formula based on SCr of 8.92 mg/dL (H)). Liver Function Tests: Recent Labs  Lab 10/16/20 1807  AST 32  ALT 20  ALKPHOS 80  BILITOT 0.4  PROT 7.0  ALBUMIN 3.3*   Recent Labs  Lab 10/16/20 1807  LIPASE 70*   No results for input(s): AMMONIA in the last 168 hours. Coagulation Profile: No results for input(s): INR, PROTIME in the last 168 hours. Cardiac Enzymes: No results for input(s): CKTOTAL, CKMB, CKMBINDEX, TROPONINI in the last 168 hours. BNP (last 3 results) No results for input(s): PROBNP in the last 8760 hours. HbA1C: No results for input(s): HGBA1C in the last 72 hours. CBG: Recent Labs  Lab 10/16/20 1934  GLUCAP 128*   Lipid Profile: No results for input(s): CHOL, HDL, LDLCALC, TRIG, CHOLHDL, LDLDIRECT in the last 72 hours. Thyroid Function Tests: No results for  input(s): TSH, T4TOTAL, FREET4, T3FREE, THYROIDAB in the last 72 hours. Anemia  Panel: No results for input(s): VITAMINB12, FOLATE, FERRITIN, TIBC, IRON, RETICCTPCT in the last 72 hours. Urine analysis:    Component Value Date/Time   COLORURINE YELLOW 10/16/2020 2136   APPEARANCEUR CLEAR 10/16/2020 2136   LABSPEC 1.016 10/16/2020 2136   PHURINE 5.0 10/16/2020 2136   GLUCOSEU NEGATIVE 10/16/2020 2136   GLUCOSEU NEG mg/dL 09/20/2010 2058   HGBUR SMALL (A) 10/16/2020 2136   HGBUR negative 05/31/2010 0948   BILIRUBINUR NEGATIVE 10/16/2020 2136   [place] NEGATIVE 10/16/2020 2136   PROTEINUR 30 (A) 10/16/2020 2136   UROBILINOGEN 0.2 06/24/2015 2336   NITRITE NEGATIVE 10/16/2020 2136   LEUKOCYTESUR NEGATIVE 10/16/2020 2136   Sepsis Labs: @LABRCNTIP (procalcitonin:4,lacticidven:4) ) Recent Results (from the past 240 hour(s))  Resp Panel by RT-PCR (Flu A&B, Covid) Nasopharyngeal Swab     Status: None   Collection Time: 10/16/20  7:01 PM   Specimen: Nasopharyngeal Swab; Nasopharyngeal(NP) swabs in vial transport medium  Result Value Ref Range Status   SARS Coronavirus 2 by RT PCR NEGATIVE NEGATIVE Final    Comment: (NOTE) SARS-CoV-2 target nucleic acids are NOT DETECTED.  The SARS-CoV-2 RNA is generally detectable in upper respiratory specimens during the acute phase of infection. The lowest concentration of SARS-CoV-2 viral copies this assay can detect is 138 copies/mL. A negative result does not preclude SARS-Cov-2 infection and should not be used as the sole basis for treatment or other patient management decisions. A negative result may occur with  improper specimen collection/handling, submission of specimen other than nasopharyngeal swab, presence of viral mutation(s) within the areas targeted by this assay, and inadequate number of viral copies(<138 copies/mL). A negative result must be combined with clinical observations, patient history, and epidemiological information. The expected result is Negative.  Fact Sheet for Patients:   EntrepreneurPulse.com.au  Fact Sheet for Healthcare Providers:  IncredibleEmployment.be  This test is no t yet approved or cleared by the [place] FDA and  has been authorized for detection and/or diagnosis of SARS-CoV-2 by FDA under an Emergency Use Authorization (EUA). This EUA will remain  in effect (meaning this test can be used) for the duration of the COVID-19 declaration under Section 564(b)(1) of the Act, 21 U.S.C.section 360bbb-3(b)(1), unless the authorization is terminated  or revoked sooner.       Influenza A by PCR NEGATIVE NEGATIVE Final   Influenza B by PCR NEGATIVE NEGATIVE Final    Comment: (NOTE) The Xpert Xpress SARS-CoV-2/FLU/RSV plus assay is intended as an aid in the diagnosis of influenza from Nasopharyngeal swab specimens and should not be used as a sole basis for treatment. Nasal washings and aspirates are unacceptable for Xpert Xpress SARS-CoV-2/FLU/RSV testing.  Fact Sheet for Patients: EntrepreneurPulse.com.au  Fact Sheet for Healthcare Providers: IncredibleEmployment.be  This test is not yet approved or cleared by the [place] FDA and has been authorized for detection and/or diagnosis of SARS-CoV-2 by FDA under an Emergency Use Authorization (EUA). This EUA will remain in effect (meaning this test can be used) for the duration of the COVID-19 declaration under Section 564(b)(1) of the Act, 21 U.S.C. section 360bbb-3(b)(1), unless the authorization is terminated or revoked.  Performed at [facility], [place] [address], [place], [place] [address]      Radiological Exams on Admission: DG Chest Port 1 View  Result Date: 10/16/2020 CLINICAL DATA:  Shortness of breath. EXAM: PORTABLE CHEST 1 VIEW COMPARISON:  Chest x-ray 03/11/2020 FINDINGS: Slightly more prominent  cardiac silhouette likely due to AP portable technique. Otherwise the heart size and mediastinal  contours are unchanged. Aortic arch calcifications. Marked calcifications of the major vessels originating from the aortic arch. Coronary artery stent. No focal consolidation. No pulmonary edema. No pleural effusion. No pneumothorax. No acute osseous abnormality. Old left rib fractures. Old right rib fractures. IMPRESSION: No active disease. Electronically Signed   By: Iven Finn M.D.   On: 10/16/2020 19:16   CT Angio Chest/Abd/Pel for Dissection W and/or Wo Contrast  Result Date: 10/16/2020 CLINICAL DATA:  Abdominal pain, aortic dissection suspected. EXAM: CT ANGIOGRAPHY CHEST, ABDOMEN AND PELVIS TECHNIQUE: Non-contrast CT of the chest was initially obtained. Multidetector CT imaging through the chest, abdomen and pelvis was performed using the standard protocol during bolus administration of intravenous contrast. Multiplanar reconstructed images and MIPs were obtained and reviewed to evaluate the vascular anatomy. CONTRAST:  173mL OMNIPAQUE IOHEXOL 350 MG/ML SOLN COMPARISON:  CT abdomen pelvis 11/03/2015, CT abdomen pelvis 06/14/2006 FINDINGS: VASCULAR Aorta: Moderate to severe calcified and noncalcified atherosclerotic plaque. Normal caliber aorta without aneurysm, dissection, vasculitis or significant stenosis. Celiac: Mild atherosclerotic plaque. Patent without evidence of aneurysm, dissection, vasculitis or significant stenosis. SMA: Severe atherosclerotic plaque with severe narrowing of the origin. Patent without evidence of aneurysm, dissection, vasculitis. Renals: At least moderate atherosclerotic plaque with mild to moderate stenosis of the origins. Both renal arteries are patent without evidence of aneurysm, dissection, vasculitis, fibromuscular dysplasia or significant stenosis. IMA: Patent without evidence of aneurysm, dissection, vasculitis or significant stenosis. Inflow: Moderate to severe atherosclerotic plaque. Patent without evidence of aneurysm, dissection, vasculitis or significant  stenosis. Veins: No obvious venous abnormality within the limitations of this arterial phase study. Review of the MIP images confirms the above findings. NON-VASCULAR CTA CHEST FINDINGS Cardiovascular: Preferential opacification of the thoracic aorta. No evidence of thoracic aortic aneurysm or dissection. Normal heart size. No significant pericardial effusion. At least moderate four-vessel coronary artery calcifications status post coronary artery bypass. The main pulmonary artery is normal in caliber. No central pulmonary embolus. Mediastinum/Nodes: No enlarged mediastinal, hilar, or axillary lymph nodes. Thyroid gland, trachea, and esophagus demonstrate no significant findings. Lungs/Pleura: Right lower lobe scattered peribronchovascular ground-glass airspace opacities (6:35, 47, 49). Nodular-like region within the right lower lobe measures up to 1.3 cm. Similar finding within left upper lobe (6:25). Nodular-ike region within the left upper lobe measures up to 7 mm. No pulmonary mass. No pleural effusion. No pneumothorax. Musculoskeletal: No chest wall abnormality No suspicious lytic or blastic osseous lesions. No acute displaced fracture. CTA ABDOMEN AND PELVIS FINDINGS Hepatobiliary: No focal liver abnormality is seen. Status post cholecystectomy. No biliary dilatation. Pancreas: No focal lesion. Normal pancreatic contour. No surrounding inflammatory changes. No main pancreatic ductal dilatation. Spleen: Normal in size without focal abnormality. Adrenals/Urinary Tract: No adrenal nodule bilaterally. Bilateral kidneys enhance symmetrically. Lobulated renal parenchyma with associated scarring and suggestion of innumerable cystic lesions. Suggestion of ill-defined underlying renal lesions of which measure simple fluid (7:164, 161). Subcentimeter hypodensities are too small to characterize. No hydronephrosis. No hydroureter. The urinary bladder is unremarkable. Stomach/Bowel: Stomach is within normal limits. No  evidence of bowel wall thickening or dilatation. Scattered sigmoid diverticulosis. Appendix appears normal. Lymphatic: No abdominal, pelvic, or inguinal lymphadenopathy. Reproductive: Prostate is unremarkable. Other: No intraperitoneal free fluid. No intraperitoneal free gas. No organized fluid collection. Musculoskeletal: Small fat containing left inguinal hernia. Possible tiny fat containing right inguinal hernia. No suspicious lytic or blastic osseous lesions. No acute displaced fracture. Old healed left  rib fractures. Old healed left rib fractures. Multilevel degenerative changes of the spine with intervertebral disc space narrowing and vacuum phenomenon at the L4-L5 and L5-S1 levels. Trace retrolisthesis of L5 on S1. Review of the MIP images confirms the above findings. IMPRESSION: VASCULAR: 1. No aortic aneurysm or dissection. Aortic Atherosclerosis (ICD10-I70.0). 2. 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 NON-VASCULAR: 1. Vague peribronchovascular ground-glass airspace opacities, some of which are nodular-like, within the right lower lobe and left upper lobes. Findings could represent infection/inflammation with COVID 19 infection not excluded. Non-contrast chest CT at 3-6 months is recommended. If the nodules are stable at time of repeat CT, then future CT at 18-24 months (from today's scan) is considered optional for low-risk patients, but is recommended for high-risk patients. This recommendation follows the consensus statement: Guidelines for Management of Incidental Pulmonary Nodules Detected on CT Images: From the Fleischner Society 2017; Radiology 2017; 284:228-243. 2. Interval development of multiple renal cystic lesions that are poorly visualized/evaluated. Recommend dedicated cross-sectional imaging renal protocol for further evaluation. 3. Scattered sigmoid diverticulosis with no acute diverticulitis. 4. Bilateral  fat containing inguinal hernias. Electronically Signed   By: Iven Finn M.D.   On: 10/16/2020 21:10    EKG: Independently reviewed.  Sinus tachycardia with ST depression in the lateral leads with the T wave inversions.  Assessment/Plan Principal Problem:   Fluid overload Active Problems:   Human immunodeficiency virus (HIV) disease (HCC)   Essential hypertension   CAD (coronary artery disease)   Polysubstance abuse (HCC)   Type 2 diabetes mellitus with vascular disease ([place])   Chronic obstructive pulmonary disease ([place])    1. Chest pain and abdominal pain concerning for non-ST elevation MI given the EKG changes and patient has not taken his medicines for last couple of days.  Will trend cardiac markers restart patient's Brilinta aspirin Coreg trend cardiac markers consult cardiology transfuse if any further decline in hemoglobin. 2. Possible GI bleed with worsening hemoglobin.  As per nephrologist patient's hemoglobin is around 11 2 weeks ago in the lab.  It is around 7.5.  We will keep patient on Protonix patient has had EGD in February of this year which showed AVMs.  Any further decline in hemoglobin will transfuse.  Note that patient is also on antiplatelet agents. 3. Missed dialysis for which nephrology has been consulted.  Patient received D50 IV insulin calcium Lokelma. 4. Tachycardia likely from worsening anemia and missed medications. 5. History of HIV last CD4 count in September was 403. 6. Alcohol abuse has been drinking alcohol last 1 week.  We will keep patient on CIWA. 7. Nonspecific bronchovascular markings in the right lower lobe and left upper lobe.  No definite evidence of pneumonia.  However given the history of HIV we did empirically keep on doxycycline check procalcitonin and LDH levels. 8. Depression -denies any suicidal ideation or overdosing with any medications.  Tylenol and salicylate levels are pending.  Has been drinking a lot of alcohol for which patient on  CIWA. 9. Diabetes mellitus type 2 mention in the chart presently not on medication.  Last hemoglobin A1c 8 months ago was 4.4.  Will check CBGs.  If elevated will keep patient on sliding scale coverage and recheck hemoglobin A1c.  Since patient has chest pain with worsening anemia and missed dialysis with hyperkalemia will need close monitoring for any further worsening in inpatient status.  Addendum -since patient's hemoglobin has further decreased discussed with  cardiologist on-call who advised to hold off patient's Brilinta aspirin for now until we make sure that patient's GI bleed is contained.   DVT prophylaxis: SCDs. Code Status: Full code. Family Communication: Discussed with patient. Disposition Plan: Home. Consults called: Nephrology, cardiology, gastroenterology. Admission status: Inpatient.   Rise Patience MD [facility] Pager [phone].  If 7PM-7AM, please contact night-coverage [email or web address] Password [email or web address]  10/17/2020, 1:30 AM

## 2020-10-17 NOTE — Consult Note (Addendum)
Cardiology Consult    [name] MRN: [id number], DOB/AGE: [birth year]   Admit date: 10/16/2020 Date of Consult: 10/17/2020  Primary Physician: [name], NP Primary Cardiologist: Jenkins Rouge, MD Requesting Provider: Balinda Quails  Patient Profile    Jacob [name] is a 69 y.o. male with a history of coronary artery disease status post CABG x2 in 2004, ischemic cardiomyopathy/HFmrEF (EF 40-45% April 2020), end-stage renal disease on hemodialysis, HIV, hypertension, hyperlipidemia, type 2 diabetes, depression, GIB req PRBCs, anemia on IV iron, tobacco abuse, and alcohol abuse mellitus, who is being seen today for the evaluation of chest pain, high-sensitivity troponin elevation in the setting of hyperkalemia after missing dialysis all week, and worsening normocytic anemia and at the request of Dr. Hal Hope.  Past Medical History   Past Medical History:  Diagnosis Date   Acute respiratory failure ([place]) 03/01/2018   Arthritis    "all over; mostly knees and back" (02/28/2018)   Chronic lower back pain    stenosis   Community acquired pneumonia 09/06/2013   COPD (chronic obstructive pulmonary disease) ([place])    Coronary atherosclerosis of native coronary artery    a. 02/2003 s/p CABG x 2 (VG->RI, VG->RPDA; b. 11/2019 PCI: LM nl, LAD 90d, D3 50, RI 100, LCX 100p, OM3 100 - fills via L->L collats from D2/dLAD, RCA 100p, VG->RPDA ok, VG->RI 95 (3.5x48 Synergy XD DES).   Drug abuse ([place])    hx; tested for cocaine as recently as 2/08. says he is not using drugs now - avoided defib. for this reason    ESRD (end stage renal disease) ([place])    Hemo M-W-F- Richarda Blade   GERD (gastroesophageal reflux disease)    takes OTC meds as needed   GI bleeding    a. 11/2019 EGD: angiodysplastic lesions w/ bleeding s/p argon plasma/clipping/epi inj.   Glaucoma    uses eye drops daily   Hepatitis B 1968   "tx'd w/isolation; caught it from toilet stools in gym"   HFrEF (heart failure  with reduced ejection fraction) ([place])    a. 01/2019 Echo: EF 40-45%, diffuse HK, mild basal septal hypertrophy.   History of blood transfusion 03/01/2019   History of colon polyps    benign   History of gout    takes Allopurinol daily as well as Colchicine-if needed (02/28/2018)   History of kidney stones    HTN (hypertension)    takes Coreg,Imdur.and Apresoline daily   Human immunodeficiency virus (HIV) disease ([place]) dx'd 1995   takes Genvoya daily   Hyperlipidemia    takes Atorvastatin daily   Ischemic cardiomyopathy    a. 01/2019 Echo: EF 40-45%, diffuse HK, mild basal septal hypertrophy. Diast dysfxn. Nl RV size/fxn. Sev dil LA. Triv MR/TR/PR.   Muscle spasm    takes Zanaflex as needed   Myocardial infarction ([place]) ~ 2004/2005   Nocturia    Peripheral neuropathy    takes gabapentin daily   Pneumonia    "at least twice" (02/28/2018)   Shortness of breath dyspnea    rarely but if notices it then with exertion   Syphilis, unspecified    Type II diabetes mellitus ([place]) 2004   Lantus daily.Average fasting blood sugar 125-199   Wears glasses    Wears partial dentures     Past Surgical History:  Procedure Laterality Date   AV FISTULA PLACEMENT Left 08/02/2018   Procedure: ARTERIOVENOUS (AV) FISTULA CREATION  left arm radiocephlic;  Surgeon: Marty Heck, MD;  Location:  [facility];  Service: Vascular;  Laterality: Left;   AV FISTULA PLACEMENT Left 08/01/2019   Procedure: LEFT BRACHIOCEPHALIC ARTERIOVENOUS (AV) FISTULA CREATION;  Surgeon: [name], MD;  Location: [facility];  Service: Vascular;  Laterality: Left;   [place] Left 10/03/2019   Procedure: BASILIC VEIN TRANSPOSITION LEFT SECOND STAGE;  Surgeon: [name], MD;  Location: [facility];  Service: Vascular;  Laterality: Left;   CARDIAC CATHETERIZATION  10/2002; 12/19/2004   [name] 03/08/2011   COLONOSCOPY  2013   [place]    CORONARY ARTERY BYPASS GRAFT  02/24/2003   CABG X2/notes 03/08/2011   CORONARY  STENT INTERVENTION N/A 12/19/2019   Procedure: CORONARY STENT INTERVENTION;  Surgeon: Jettie Booze, MD;  Location: [facility];  Service: Cardiovascular;  Laterality: N/A;   ESOPHAGOGASTRODUODENOSCOPY (EGD) WITH PROPOFOL N/A 02/08/2019   Procedure: ESOPHAGOGASTRODUODENOSCOPY (EGD) WITH PROPOFOL;  Surgeon: Milus Banister, MD;  Location: [facility];  Service: Gastroenterology;  Laterality: N/A;   ESOPHAGOGASTRODUODENOSCOPY (EGD) WITH PROPOFOL N/A 12/22/2019   Procedure: ESOPHAGOGASTRODUODENOSCOPY (EGD) WITH PROPOFOL;  Surgeon: Lavena Bullion, DO;  Location: [facility];  Service: Gastroenterology;  Laterality: N/A;   HEMOSTASIS CLIP PLACEMENT  12/22/2019   Procedure: HEMOSTASIS CLIP PLACEMENT;  Surgeon: Lavena Bullion, DO;  Location: [facility];  Service: Gastroenterology;;   HOT HEMOSTASIS N/A 02/08/2019   Procedure: HOT HEMOSTASIS (ARGON PLASMA COAGULATION/BICAP);  Surgeon: Milus Banister, MD;  Location[facility] ENDOSCOPY;  Service: Gastroenterology;  Laterality: N/A;   HOT HEMOSTASIS N/A 12/22/2019   Procedure: HOT HEMOSTASIS (ARGON PLASMA COAGULATION/BICAP);  Surgeon: Lavena Bullion, DO;  Location: [facility]COPY;  Service: Gastroenterology;  Laterality: N/A;   INTERTROCHANTERIC HIP FRACTURE SURGERY Left 11/2006   [name] 03/08/2011   INTRAVASCULAR ULTRASOUND/IVUS N/A 12/19/2019   Procedure: Intravascular Ultrasound/IVUS;  Surgeon: Jettie Booze, MD;  Location: [facility];  Service: Cardiovascular;  Laterality: N/A;   IR FLUORO GUIDE CV LINE RIGHT  07/24/2019   IR FLUORO GUIDE CV LINE RIGHT  07/30/2019   IR US GUIDE VASC ACCESS RIGHT  07/24/2019   IR US GUIDE VASC ACCESS RIGHT  07/30/2019   LAPAROSCOPIC CHOLECYSTECTOMY  05/2006   LIGATION OF COMPETING BRANCHES OF ARTERIOVENOUS FISTULA Left 11/05/2018   Procedure: LIGATION OF COMPETING BRANCHES OF ARTERIOVENOUS FISTULA  LEFT  ARM;  Surgeon: Marty Heck, MD;  Location: [facility] OR;  Service: Vascular;   Laterality: Left;   LUMBAR LAMINECTOMY/DECOMPRESSION MICRODISCECTOMY N/A 02/29/2016   Procedure: Left L4-5 Lateral Recess Decompression, Removal Extradural Intraspinal Facet Cyst;  Surgeon: Marybelle Killings, MD;  Location: [facility];  Service: Orthopedics;  Laterality: N/A;   MULTIPLE TOOTH EXTRACTIONS     ORIF MANDIBULAR FRACTURE Left 08/13/2004   ORIF of left body fracture mandible with KLS Martin 2.3-mm six hole/notes 03/08/2011   RIGHT/LEFT HEART CATH AND CORONARY/GRAFT ANGIOGRAPHY N/A 12/19/2019   Procedure: RIGHT/LEFT HEART CATH AND CORONARY/GRAFT ANGIOGRAPHY;  Surgeon: Jettie Booze, MD;  Location: [facility];  Service: Cardiovascular;  Laterality: N/A;   SCLEROTHERAPY  12/22/2019   Procedure: SCLEROTHERAPY;  Surgeon: Lavena Bullion, DO;  Location: [facility] ENDOSCOPY;  Service: Gastroenterology;;     Allergies  Allergies  Allergen Reactions   Augmentin [Amoxicillin-Pot Clavulanate] Diarrhea    Severe   Amphetamines Other (See Comments)    Unknown reaction type    History of Present Illness    69 y/o ? w/ the above complex PMH including coronary artery disease, ischemic cardiomyopathy, HFmrEF, hypertension, hyperlipidemia, type 2  diabetes mellitus, end-stage renal disease on hemodialysis, tobacco abuse, alcohol abuse, GI bleed, normocytic anemia, and HIV.  Cardiac history dates back to January 2004, when he presented with left arm tingling and pain and underwent diagnostic catheterization revealing severe multivessel disease.  EF was 20% at the time.  After initial deferral, he subsequently underwent CABG x2.  He continued to have LV dysfunction (25-30% 2008) on subsequent echocardiograms but was not felt to be an ICD candidate due to cocaine abuse at the time.  LV function subsequently improved slightly and has been trending in the 35 to 50% range since January 2011 with his most recent echocardiogram in 01/2019 showing an EF of 45 to 50%.  Most recent ischemic testing performed in  February 2020 was nonischemic with large inferior wall infarct.  Over the years, has had progressive renal failure and initiated hemodialysis October 2020.  In the setting of anemia, he has required outpatient iron infusions.    In February 2021, he noted worsening dyspnea and was seen in the office with subsequent right and left heart diagnostic catheterization revealing severe stenosis in the vein graft to the ramus intermedius with total occlusions of the ramus intermedius, left circumflex, and right coronary artery.  The vein graft to the RPDA was widely patent.  He had 90% apical stenosis in the LAD.  The vein graft to the ramus intermedius was successfully intervened upon with a synergy XT drug-eluting stent.  Patient was placed on aspirin and Brilinta post procedure.  Following catheterization, in the setting of melena and anemia, he underwent GI evaluation with EGD showing angiodysplastic lesions with bleeding in the gastric fundus and gastric body.  Lesions were treated with accommodation of argon plasma, clipping, and epinephrine injection.  He was last seen in cardiology clinic in October 2021, at which time he noted worsening dyspnea though he was felt to be euvolemic on examination.  Patient notes that he has been drinking more over the past few weeks in the setting of depression.  He has been drinking 1 bottle of alcohol daily and also smoking half pack a day.  He says at some point last week, he had left lateral chest pain but cannot really objectify how frequently was occurring or how long it lasted.  He denies any recent history of dyspnea on exertion.  In the setting of increased alcohol intake, he missed dialysis all 3 days this week (last session Friday, December 18).  He says that he began feeling poorly on Friday, December 24 and recognize that he would need dialysis with new dialysis centers were closed on Saturday and so he presented to the emergency department where he was found to have  a potassium of 6.2, BUN of 203, and creatinine of 8.92.  The patient currently denies this, admission notes indicate that he complained of left chest and epigastric pain on arrival.  His ECG shows sinus tachycardia rate of 127, left axis deviation, right bundle branch block, and inferior infarct.  No acute changes are noted.  Initial hemoglobin was 7.5 with hematocrit of 22 with follow-up labs revealing a hemoglobin of 6.5 and hematocrit of 19.9.  Initial troponin was mildly elevated at 68 and subsequently rose to 437 this morning.  CT angiogram of the chest, abdomen, and pelvis showed nonspecific peribronchial groundglass opacities in the right lower lobe and left upper lobe with recommendation for follow-up CT (Covid testing was negative).  Severe narrowing of the superior mesenteric artery was also noted.  In the setting  of hyperkalemia, he was treated with D50, IV insulin, and calcium gluconate.  He is currently being transfused 1 unit of packed red blood cells.  Brilinta is currently on hold and he is pending dialysis this morning.  He is somewhat groggy and complains of generalized malaise without a specific complaints.  Currently denies any chest pain or dyspnea.  Inpatient Medications     allopurinol  100 mg Oral Daily   [START ON 10/18/2020] aspirin  81 mg Oral Daily   bictegravir-emtricitabine-tenofovir AF  1 tablet Oral Daily   [START ON 10/19/2020] calcitRIOL  3 mcg Oral Q M,W,F-HD   carvedilol  3.125 mg Oral BID WC   Chlorhexidine Gluconate Cloth  6 each Topical Q0600   [START ON 10/19/2020] cinacalcet  90 mg Oral Q M,W,F-1800   [START ON 10/23/2020] darbepoetin (ARANESP) injection - DIALYSIS  100 mcg Intravenous Q Fri-HD   folic acid  1 mg Oral Daily   gabapentin  300 mg Oral BID   insulin aspart  0-6 Units Subcutaneous Q4H   isosorbide mononitrate  30 mg Oral Daily   latanoprost  1 drop Both Eyes QHS   multivitamin  1 tablet Oral QHS   multivitamin with minerals  1 tablet Oral Daily    pantoprazole (PROTONIX) IV  40 mg Intravenous Q12H   sevelamer carbonate  2,400 mg Oral TID WC   thiamine  100 mg Oral Daily   Or   thiamine  100 mg Intravenous Daily   timolol  1 drop Both Eyes Daily   umeclidinium-vilanterol  1 puff Inhalation Daily    Family History    Family History  Problem Relation Age of Onset   Heart failure Father    Hypertension Father    Diabetes Brother    Heart attack Brother    Alzheimer's disease Mother    Stroke Sister    Diabetes Sister    Alzheimer's disease Sister    Hypertension Brother    Diabetes Brother    Drug abuse Brother    Colon cancer Neg Hx    He indicated that his mother is deceased. He indicated that his father is deceased. He indicated that all of his three sisters are alive. He indicated that two of his five brothers are alive. He indicated that his maternal grandmother is deceased. He indicated that his maternal grandfather is deceased. He indicated that his paternal grandmother is deceased. He indicated that his paternal grandfather is deceased. He indicated that the status of his neg hx is unknown.   Social History    Social History   Socioeconomic History   Marital status: Single    Spouse name: Not on file   Number of children: 1   Years of education: Not on file   Highest education level: Not on file  Occupational History   Occupation: retired  Tobacco Use   Smoking status: Current Every Day Smoker    Packs/day: 0.50    Years: 43.00    Pack years: 21.50    Types: Cigarettes   Smokeless tobacco: Never Used  Scientific laboratory technician Use: Never used  Substance and Sexual Activity   Alcohol use: Yes    Alcohol/week: 12.0 standard drinks    Types: 12 Standard drinks or equivalent per week    Comment: occ   Drug use: No    Types: Cocaine    Comment: hx of crack/cocaine 65yrs ago 10/01/2019- none   Sexual activity: Yes    Partners: Male  Other Topics Concern   Not on file  Social History Narrative   Retired.        As of 05/2015:   Diet: No salt    Caffeine   Married: Single    House: Condo, 2 stories, 1 person (self)    Pets: No    Current/Past profession: N/A   Exercise: walks daily    Living Will: Yes    DNR   POA/HPOA: No       Social Determinants of Radio broadcast assistant Strain: Not on file  Food Insecurity: Not on file  Transportation Needs: Not on file  Physical Activity: Not on file  Stress: Not on file  Social Connections: Not on file  Intimate Partner Violence: Not on file     Review of Systems    General:  Generalized malaise.  No chills, fever, night sweats or weight changes.  Cardiovascular:  No chest pain, dyspnea on exertion, edema, orthopnea, palpitations, paroxysmal nocturnal dyspnea. Dermatological: No rash, lesions/masses Respiratory: No cough, dyspnea Urologic: No hematuria, dysuria Abdominal:   No nausea, vomiting, diarrhea, bright red blood per rectum, melena, or hematemesis Neurologic:  No visual changes, +++ generalized wkns, changes in mental status. All other systems reviewed and are otherwise negative except as noted above.  Physical Exam    Blood pressure (!) 164/69, pulse (!) 107, temperature 97.6 F (36.4 C), temperature source Axillary, resp. rate 18, height 6\' 2"  (1.88 m), weight 76.6 kg, SpO2 98 %.  General: Pleasant, NAD Psych: Very groggy, flat affect. Neuro: Groggy, nods off to sleep during interview.  Alert and oriented X 3 after verbal stimuli. Moves all extremities spontaneously. HEENT: Normal  Neck: Supple without bruits or JVD. Lungs:  Resp regular and unlabored, bibasilar crackles. Heart: RRR, tachycardic, 2/6 SEM loudest @ LUSB, no s3, s4, or murmurs. Abdomen: Soft, non-tender, non-distended, BS + x 4.  Extremities: No clubbing, cyanosis or edema. DP/PT2+, Radials 2+ and equal bilaterally. LUE AVF w/ + bruit/thrill.  Labs    Cardiac Enzymes Recent Labs  Lab 10/16/20 1856 10/16/20 2056 10/17/20 0139 10/17/20 0318   TROPONINIHS 68* 73* 222* 437*      Lab Results  Component Value Date   WBC 7.5 10/17/2020   HGB 6.5 (LL) 10/17/2020   HCT 19.9 (L) 10/17/2020   MCV 97.5 10/17/2020   PLT 195 10/17/2020    Recent Labs  Lab 10/16/20 1807 10/17/20 0139  NA 133*  --   K 6.2*  --   CL 95*  --   CO2 13*  --   BUN 203*  --   CREATININE 8.92* 8.61*  CALCIUM 8.0*  --   PROT 7.0  --   BILITOT 0.4  --   ALKPHOS 80  --   ALT 20  --   AST 32  --   GLUCOSE 197*  --    Lab Results  Component Value Date   CHOL 65 12/21/2019   HDL 17 (L) 12/21/2019   LDLCALC 29 12/21/2019   TRIG 95 12/21/2019    Radiology Studies    DG Chest Port 1 View  Result Date: 10/16/2020 CLINICAL DATA:  Shortness of breath. EXAM: PORTABLE CHEST 1 VIEW COMPARISON:  Chest x-ray 03/11/2020 FINDINGS: Slightly more prominent cardiac silhouette likely due to AP portable technique. Otherwise the heart size and mediastinal contours are unchanged. Aortic arch calcifications. Marked calcifications of the major vessels originating from the aortic arch. Coronary artery stent. No focal consolidation. No pulmonary edema. No  pleural effusion. No pneumothorax. No acute osseous abnormality. Old left rib fractures. Old right rib fractures. IMPRESSION: No active disease. Electronically Signed   By: Iven Finn M.D.   On: 10/16/2020 19:16   CT Angio Chest/Abd/Pel for Dissection W and/or Wo Contrast  Result Date: 10/16/2020 CLINICAL DATA:  Abdominal pain, aortic dissection suspected. EXAM: CT ANGIOGRAPHY CHEST, ABDOMEN AND PELVIS  IMPRESSION: VASCULAR: 1. No aortic aneurysm or dissection. Aortic Atherosclerosis (ICD10-I70.0). 2. 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 NON-VASCULAR: 1. Vague peribronchovascular ground-glass airspace opacities, some of which are nodular-like, within the right lower lobe and left upper lobes. Findings could represent  infection/inflammation with COVID 19 infection not excluded. Non-contrast chest CT at 3-6 months is recommended. If the nodules are stable at time of repeat CT, then future CT at 18-24 months (from today's scan) is considered optional for low-risk patients, but is recommended for high-risk patients. This recommendation follows the consensus statement: Guidelines for Management of Incidental Pulmonary Nodules Detected on CT Images: From the Fleischner Society 2017; Radiology 2017; 284:228-243. 2. Interval development of multiple renal cystic lesions that are poorly visualized/evaluated. Recommend dedicated cross-sectional imaging renal protocol for further evaluation. 3. Scattered sigmoid diverticulosis with no acute diverticulitis. 4. Bilateral fat containing inguinal hernias. Electronically Signed   By: Iven Finn M.D.   On: 10/16/2020 21:10    ECG & Cardiac Imaging    Sinus tachycardia, 127, RBBB, old inf infarct - personally reviewed.  Assessment & Plan    1.  Demand Ischemia/CAD: Patient with a history of coronary artery disease status post two-vessel bypass in 2004 followed by more recent PCI and drug-eluting stent to the vein graft  ramus intermedius in February 2021.  At last cardiology visit in October of this year, patient denied chest pain but did notice an increase in dyspnea on exertion despite stable weight and volume.  He presented to the emergency department on the evening of December 24 after missing dialysis for the entire week due to increase in alcoholism and depression.  On arrival he reported left-sided chest and epigastric pain.  ECG showed right bundle branch block with old inferior infarct but no acute changes.  High-sensitivity troponin was mildly elevated on arrival at 68 with subsequent rise to 73  222  437.  He denies chest pain this morning.  During ER stay, he was found to be acutely anemic (H&H 6.5/19.9) and hyperkalemic with potassium of 6.2.  Given anemia and concern  related to GI bleed, considering that he is greater than 6 months out from most recent PCI, it is reasonable to hold Brilinta.  We do recommend continuing aspirin if felt to be feasible by GI.  Will obtain a 2D echocardiogram to reevaluate LV function in the setting of presumed demand ischemia.  Will consider further ischemic evaluation pending echo and recovery from acute anemia and renal failure.  Consider beta-blocker nitrate therapy.  It appears that atorvastatin has fallen off of his list and I will add back.  2.  Normocytic anemia/history of GI bleed: Patient notes dark stools within the past few weeks but says these have resolved since presentation to the hospital.  H&H 6.5 and 19.9 this morning.  Currently receiving packed red blood cells.  Prior history of GI evaluation with EGD in February of this year revealing angiodysplastic lesions with bleeding in the gastric fundus and body.  As above, recommend holding Brilinta but will continue aspirin.  Continue  IV PPI therapy.  3.  End-stage renal disease: Patient noncompliant with dialysis last week.  Last session was Friday, December 18.  He was hyperkalemic on arrival and this has been treated.  Pending dialysis this morning.  4.  Hyperkalemia: In the setting of #3.  He received insulin, D50, and calcium gluconate in the emergency room.  5.  Heart failure with midrange ejection fraction/ischemic cardiomyopathy: Most recent echo in April 2020 with EF of 40-45%.  He does have crackles on examination but otherwise does not appear to be significantly volume overloaded.  Continue beta-blocker therapy. No ACEI/ARB/ARNI/Aldosterone antagonist in setting of end-stage renal disease and hyperkalemia.  Volume management per nephrology/hemodialysis.  6.  Hyperlipidemia: LDL of 29 in February 2021.  Resume atorvastatin therapy.  7.  Tobacco abuse: Currently smoking half a pack a day.  Cessation advised.  Will need additional counseling when more alert.  8.   Alcohol abuse: Drinking a bottle of alcohol daily over the past few weeks.  CIWA protocol ordered.  Will need cessation counseling and would likely benefit from social work consultation for outpatient resources.  9.  Type 2 diabetes mellitus: Per internal medicine.  10.  HIV:  Follows w/ ID as outpt.  Well-controlled per notes.  On BIKTARVY.  Signed, Murray Hodgkins, NP 10/17/2020, 8:39 AM  Agree with note by Ignacia Bayley RNP  Chronically ill-appearing 69 year old African-American male who we are asked to see because of positive enzymes.  His troponins peaked in the 400 range.  He had CABG in 2004 and ramus vein graft stenting February of this year.  Other problems as outlined including chronic renal insufficiency on hemodialysis, continue tobacco abuse, continue ethanol abuse, HIV positive, moderate LV dysfunction and severe anemia.  He has been on dual antiplatelet therapy.  Denies chest pain.  His hemoglobin upon admission was 6.5.  He is receiving unit of packed red blood cells.  Does complain of melena over the last several weeks.  His exam is benign.  He is a soft outflow tract murmur.  Plans are for EGD.  I think is reasonable to hold his Brilinta continue aspirin.  We will recheck a 2D echo and continue to follow after his EGD.  Lorretta Harp, M.D., [place], [facility], Laverta Baltimore [place] [address] [place], [place]  [address]  [phone] 10/17/2020 11:10 AM  For questions or updates, please contact   Please consult [email or web address] for contact info under Cardiology/STEMI.

## 2020-10-17 NOTE — Progress Notes (Signed)
PROGRESS NOTE    Jacob [name]  ZOX:[id number] DOB: 13-Mar-1951 DOA: 10/16/2020 PCP: [name], NP   Brief Narrative:  Jacob [name] is a 69 y.o. male with history of CAD status post stenting last 1 was in February 2021 presently on Brilinta which patient has not taken for last couple of days with history of ESRD on hemodialysis missed last 3 sessions patient is also depressed and has been drinking a lot of alcohol last 1 week presents to the ER with complaints of having missed dialysis.  In addition patient also states he has been having some left-sided chest pain and abdominal pain mostly in the epigastric area.    ED Course: In the ER patient was tachycardic with EKG showing ST depression and T wave inversions in the lateral leads.  Initial troponin was 68 and second on a 73.  Hemoglobin is around 7.5 potassium 6.2 bicarb 13 given the patient's symptoms of chest pain and abdominal pain CT angiogram of the chest abdomen pelvis was done which shows nonspecific peribronchial groundglass opacities in the right lower lobe and left upper lobe.  There is also severe narrowing of the superior mesenteric artery.  On-call nephrologist Dr. Graylon Gunning was consulted.  At this time Dr. Posey Pronto has arranged for dialysis and also given D50 and IV insulin and calcium gluconate.  At the time of my exam patient is chest pain-free.  Covid test is negative.  Patient remains tachycardic.  His hemoglobin noted to be 6.5-1 unit PRBC ordered.  Patient has history of AVMs.  GI consulted.   Assessment & Plan:   NSTEMI: -Patient has history of coronary artery disease status post two-vessel bypass in 2004 followed by recent PCI and drug-eluting stent in February 2021. -Patient presented with chest pain and elevated troponin.  Troponin trended up from 73-222-437.  EKG shows ST-T wave changes in lateral leads. -Cardiology consulted-appreciate help. -Echo is ordered and is pending. -Continue aspirin,  beta-blocker, statin, Imdur.  Hold Brilinta  in the setting of acute GI bleed.  Acute blood loss anemia: -In the setting of upper GI bleed. -Patient reports melena since 2 days.  H&H dropped to 6.5/19.9 this morning.  Currently receiving 1 unit PRBC. -EGD in February 2021 revealing angiodysplastic lesions with bleeding in the gastric fundus and body. -GI consulted-appreciate help.  Recommended to start on clear liquid and then n.p.o. after midnight -Monitor H&H closely.  Continue to hold Brilinta at this time. -Continue IV PPI.  ESRD on hemodialysis: Patient has been noncompliant with his dialysis last week. -Last session of dialysis on December 18. -Patient hyperkalemic and hypertensive upon arrival. -Nephrology consulted for dialysis.  Hyperkalemia: In the setting of ESRD and missed hemodialysis -He received insulin, D50 and calcium gluconate in the ED.  Monitor closely on telemetry.  Repeat BMP tomorrow a.m.  CAP?: -CTA chest concerning for vague peribronchovascular groundglass airspace opacities which are nodular like within the right lower lobe and left upper lobe-findings could represent infection/inflammation.  PCT: 1.04, LDH: WNL -Patient is currently maintaining oxygen saturation on room air.  Patient is started on doxycycline considering his immunocompromised status in the setting of HIV -We will continue IV antibiotics.  Patient is afebrile with no leukocytosis.  History of alcohol abuse: -Continue multivitamin, folic acid and thiamine. -On CIWA protocol. -Monitor electrolytes and monitor for DTs.  Hypertension: Continue Coreg and Imdur.  Monitor blood pressure closely  Diabetes mellitus: Last A1c 4.4% - 6 months ago.  We will repeat A1c today.  Continue sliding scale insulin  Diabetes neuropathy: Continue gabapentin  Hyperlipidemia: Continue statin  HIV: Continue Biktarvy  DVT prophylaxis: SCD Code Status: Full code Family Communication:  None present at bedside.   Plan of care discussed with patient in length and he verbalized understanding and agreed with it. Disposition Plan: To be determined  Consultants:  Cardiology GI Nephrology   procedures:   CTA chest  Antimicrobials:   Doxy  Status is: Inpatient  Remains inpatient appropriate because:Ongoing diagnostic testing needed not appropriate for outpatient work up   Dispo: The patient is from: Home              Anticipated d/c is to: Home              Anticipated d/c date is: 3 days              Patient currently is not medically stable to d/c.         Subjective: Patient seen and examined.  He is not happy that he did not get dialysis yet.  Reports melena since 2 days.  Denies epigastric pain, use of NSAIDs.  Denies chest pain, shortness of breath, leg swelling, orthopnea or PND.  Objective: Vitals:   10/17/20 0650 10/17/20 0743 10/17/20 0827 10/17/20 0854  BP: (!) 146/55 (!) 164/69  (!) 148/73  Pulse: (!) 104 (!) 107  93  Resp: 18 17 18 17   Temp: 98 F (36.7 C) 97.6 F (36.4 C)    TempSrc: Oral Axillary    SpO2: 97% 98%  97%  Weight:      Height:        Intake/Output Summary (Last 24 hours) at 10/17/2020 0924 Last data filed at 10/17/2020 0441 Gross per 24 hour  Intake 290 ml  Output --  Net 290 ml   Filed Weights   10/16/20 1800 10/16/20 2338 10/17/20 0445  Weight: 81.6 kg 76.6 kg 76.6 kg    Examination:  General exam: Appears calm and comfortable, on room air, appears tired and frustrated. Respiratory system: Clear to auscultation. Respiratory effort normal. Cardiovascular system: S1 & S2 heard, RRR. No JVD, murmurs, rubs, gallops or clicks. No pedal edema. Gastrointestinal system: Abdomen is nondistended, soft and nontender. No organomegaly or masses felt. Normal bowel sounds heard. Central nervous system: Alert and oriented. No focal neurological deficits. Extremities: Symmetric 5 x 5 power. Skin: No rashes, lesions or ulcers Psychiatry: Judgement  and insight appear normal. Mood & affect appropriate.    Data Reviewed: I have personally reviewed following labs and imaging studies  CBC: Recent Labs  Lab 10/16/20 1807 10/17/20 0139  WBC 10.1 7.5  HGB 7.5* 6.5*  HCT 22.0* 19.9*  MCV 96.1 97.5  PLT 188 696   Basic Metabolic Panel: Recent Labs  Lab 10/16/20 1807 10/17/20 0139  NA 133*  --   K 6.2*  --   CL 95*  --   CO2 13*  --   GLUCOSE 197*  --   BUN 203*  --   CREATININE 8.92* 8.61*  CALCIUM 8.0*  --    GFR: Estimated Creatinine Clearance: 8.5 mL/min (A) (by C-G formula based on SCr of 8.61 mg/dL (H)). Liver Function Tests: Recent Labs  Lab 10/16/20 1807  AST 32  ALT 20  ALKPHOS 80  BILITOT 0.4  PROT 7.0  ALBUMIN 3.3*   Recent Labs  Lab 10/16/20 1807  LIPASE 70*   No results for input(s): AMMONIA in the last 168 hours. Coagulation Profile: No results  for input(s): INR, PROTIME in the last 168 hours. Cardiac Enzymes: No results for input(s): CKTOTAL, CKMB, CKMBINDEX, TROPONINI in the last 168 hours. BNP (last 3 results) No results for input(s): PROBNP in the last 8760 hours. HbA1C: No results for input(s): HGBA1C in the last 72 hours. CBG: Recent Labs  Lab 10/16/20 1934 10/17/20 0606 10/17/20 0852  GLUCAP 128* 226* 150*   Lipid Profile: No results for input(s): CHOL, HDL, LDLCALC, TRIG, CHOLHDL, LDLDIRECT in the last 72 hours. Thyroid Function Tests: Recent Labs    10/17/20 0139  TSH 0.748   Anemia Panel: No results for input(s): VITAMINB12, FOLATE, FERRITIN, TIBC, IRON, RETICCTPCT in the last 72 hours. Sepsis Labs: Recent Labs  Lab 10/17/20 0547  PROCALCITON 1.04    Recent Results (from the past 240 hour(s))  Resp Panel by RT-PCR (Flu A&B, Covid) Nasopharyngeal Swab     Status: None   Collection Time: 10/16/20  7:01 PM   Specimen: Nasopharyngeal Swab; Nasopharyngeal(NP) swabs in vial transport medium  Result Value Ref Range Status   SARS Coronavirus 2 by RT PCR NEGATIVE  NEGATIVE Final    Comment: (NOTE) SARS-CoV-2 target nucleic acids are NOT DETECTED.  The SARS-CoV-2 RNA is generally detectable in upper respiratory specimens during the acute phase of infection. The lowest concentration of SARS-CoV-2 viral copies this assay can detect is 138 copies/mL. A negative result does not preclude SARS-Cov-2 infection and should not be used as the sole basis for treatment or other patient management decisions. A negative result may occur with  improper specimen collection/handling, submission of specimen other than nasopharyngeal swab, presence of viral mutation(s) within the areas targeted by this assay, and inadequate number of viral copies(<138 copies/mL). A negative result must be combined with clinical observations, patient history, and epidemiological information. The expected result is Negative.  Fact Sheet for Patients:  EntrepreneurPulse.com.au  Fact Sheet for Healthcare Providers:  IncredibleEmployment.be  This test is no t yet approved or cleared by the [place] FDA and  has been authorized for detection and/or diagnosis of SARS-CoV-2 by FDA under an Emergency Use Authorization (EUA). This EUA will remain  in effect (meaning this test can be used) for the duration of the COVID-19 declaration under Section 564(b)(1) of the Act, 21 U.S.C.section 360bbb-3(b)(1), unless the authorization is terminated  or revoked sooner.       Influenza A by PCR NEGATIVE NEGATIVE Final   Influenza B by PCR NEGATIVE NEGATIVE Final    Comment: (NOTE) The Xpert Xpress SARS-CoV-2/FLU/RSV plus assay is intended as an aid in the diagnosis of influenza from Nasopharyngeal swab specimens and should not be used as a sole basis for treatment. Nasal washings and aspirates are unacceptable for Xpert Xpress SARS-CoV-2/FLU/RSV testing.  Fact Sheet for Patients: EntrepreneurPulse.com.au  Fact Sheet for Healthcare  Providers: IncredibleEmployment.be  This test is not yet approved or cleared by the [place] FDA and has been authorized for detection and/or diagnosis of SARS-CoV-2 by FDA under an Emergency Use Authorization (EUA). This EUA will remain in effect (meaning this test can be used) for the duration of the COVID-19 declaration under Section 564(b)(1) of the Act, 21 U.S.C. section 360bbb-3(b)(1), unless the authorization is terminated or revoked.  Performed at [facility], [place] [address] [place], [place] [address]       Radiology Studies: DG Chest Port 1 View  Result Date: 10/16/2020 CLINICAL DATA:  Shortness of breath. EXAM: PORTABLE CHEST 1 VIEW COMPARISON:  Chest x-ray 03/11/2020 FINDINGS: Slightly more prominent  cardiac silhouette likely due to AP portable technique. Otherwise the heart size and mediastinal contours are unchanged. Aortic arch calcifications. Marked calcifications of the major vessels originating from the aortic arch. Coronary artery stent. No focal consolidation. No pulmonary edema. No pleural effusion. No pneumothorax. No acute osseous abnormality. Old left rib fractures. Old right rib fractures. IMPRESSION: No active disease. Electronically Signed   By: Iven Finn M.D.   On: 10/16/2020 19:16   CT Angio Chest/Abd/Pel for Dissection W and/or Wo Contrast  Result Date: 10/16/2020 CLINICAL DATA:  Abdominal pain, aortic dissection suspected. EXAM: CT ANGIOGRAPHY CHEST, ABDOMEN AND PELVIS TECHNIQUE: Non-contrast CT of the chest was initially obtained. Multidetector CT imaging through the chest, abdomen and pelvis was performed using the standard protocol during bolus administration of intravenous contrast. Multiplanar reconstructed images and MIPs were obtained and reviewed to evaluate the vascular anatomy. CONTRAST:  167mL OMNIPAQUE IOHEXOL 350 MG/ML SOLN COMPARISON:  CT abdomen pelvis 11/03/2015, CT abdomen pelvis 06/14/2006 FINDINGS:  VASCULAR Aorta: Moderate to severe calcified and noncalcified atherosclerotic plaque. Normal caliber aorta without aneurysm, dissection, vasculitis or significant stenosis. Celiac: Mild atherosclerotic plaque. Patent without evidence of aneurysm, dissection, vasculitis or significant stenosis. SMA: Severe atherosclerotic plaque with severe narrowing of the origin. Patent without evidence of aneurysm, dissection, vasculitis. Renals: At least moderate atherosclerotic plaque with mild to moderate stenosis of the origins. Both renal arteries are patent without evidence of aneurysm, dissection, vasculitis, fibromuscular dysplasia or significant stenosis. IMA: Patent without evidence of aneurysm, dissection, vasculitis or significant stenosis. Inflow: Moderate to severe atherosclerotic plaque. Patent without evidence of aneurysm, dissection, vasculitis or significant stenosis. Veins: No obvious venous abnormality within the limitations of this arterial phase study. Review of the MIP images confirms the above findings. NON-VASCULAR CTA CHEST FINDINGS Cardiovascular: Preferential opacification of the thoracic aorta. No evidence of thoracic aortic aneurysm or dissection. Normal heart size. No significant pericardial effusion. At least moderate four-vessel coronary artery calcifications status post coronary artery bypass. The main pulmonary artery is normal in caliber. No central pulmonary embolus. Mediastinum/Nodes: No enlarged mediastinal, hilar, or axillary lymph nodes. Thyroid gland, trachea, and esophagus demonstrate no significant findings. Lungs/Pleura: Right lower lobe scattered peribronchovascular ground-glass airspace opacities (6:35, 47, 49). Nodular-like region within the right lower lobe measures up to 1.3 cm. Similar finding within left upper lobe (6:25). Nodular-ike region within the left upper lobe measures up to 7 mm. No pulmonary mass. No pleural effusion. No pneumothorax. Musculoskeletal: No chest wall  abnormality No suspicious lytic or blastic osseous lesions. No acute displaced fracture. CTA ABDOMEN AND PELVIS FINDINGS Hepatobiliary: No focal liver abnormality is seen. Status post cholecystectomy. No biliary dilatation. Pancreas: No focal lesion. Normal pancreatic contour. No surrounding inflammatory changes. No main pancreatic ductal dilatation. Spleen: Normal in size without focal abnormality. Adrenals/Urinary Tract: No adrenal nodule bilaterally. Bilateral kidneys enhance symmetrically. Lobulated renal parenchyma with associated scarring and suggestion of innumerable cystic lesions. Suggestion of ill-defined underlying renal lesions of which measure simple fluid (7:164, 161). Subcentimeter hypodensities are too small to characterize. No hydronephrosis. No hydroureter. The urinary bladder is unremarkable. Stomach/Bowel: Stomach is within normal limits. No evidence of bowel wall thickening or dilatation. Scattered sigmoid diverticulosis. Appendix appears normal. Lymphatic: No abdominal, pelvic, or inguinal lymphadenopathy. Reproductive: Prostate is unremarkable. Other: No intraperitoneal free fluid. No intraperitoneal free gas. No organized fluid collection. Musculoskeletal: Small fat containing left inguinal hernia. Possible tiny fat containing right inguinal hernia. No suspicious lytic or blastic osseous lesions. No acute displaced fracture. Old healed left rib  fractures. Old healed left rib fractures. Multilevel degenerative changes of the spine with intervertebral disc space narrowing and vacuum phenomenon at the L4-L5 and L5-S1 levels. Trace retrolisthesis of L5 on S1. Review of the MIP images confirms the above findings. IMPRESSION: VASCULAR: 1. No aortic aneurysm or dissection. Aortic Atherosclerosis (ICD10-I70.0). 2. 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 NON-VASCULAR: 1. Vague peribronchovascular ground-glass airspace  opacities, some of which are nodular-like, within the right lower lobe and left upper lobes. Findings could represent infection/inflammation with COVID 19 infection not excluded. Non-contrast chest CT at 3-6 months is recommended. If the nodules are stable at time of repeat CT, then future CT at 18-24 months (from today's scan) is considered optional for low-risk patients, but is recommended for high-risk patients. This recommendation follows the consensus statement: Guidelines for Management of Incidental Pulmonary Nodules Detected on CT Images: From the Fleischner Society 2017; Radiology 2017; 284:228-243. 2. Interval development of multiple renal cystic lesions that are poorly visualized/evaluated. Recommend dedicated cross-sectional imaging renal protocol for further evaluation. 3. Scattered sigmoid diverticulosis with no acute diverticulitis. 4. Bilateral fat containing inguinal hernias. Electronically Signed   By: Iven Finn M.D.   On: 10/16/2020 21:10    Scheduled Meds: . allopurinol  100 mg Oral Daily  . [START ON 10/18/2020] aspirin  81 mg Oral Daily  . atorvastatin  40 mg Oral Daily  . bictegravir-emtricitabine-tenofovir AF  1 tablet Oral Daily  . [START ON 10/19/2020] calcitRIOL  3 mcg Oral Q M,W,F-HD  . carvedilol  3.125 mg Oral BID WC  . Chlorhexidine Gluconate Cloth  6 each Topical Q0600  . [START ON 10/19/2020] cinacalcet  90 mg Oral Q M,W,F-1800  . [START ON 10/23/2020] darbepoetin (ARANESP) injection - DIALYSIS  100 mcg Intravenous Q Fri-HD  . folic acid  1 mg Oral Daily  . gabapentin  300 mg Oral BID  . insulin aspart  0-6 Units Subcutaneous Q4H  . isosorbide mononitrate  30 mg Oral Daily  . latanoprost  1 drop Both Eyes QHS  . multivitamin  1 tablet Oral QHS  . pantoprazole (PROTONIX) IV  40 mg Intravenous Q12H  . sevelamer carbonate  2,400 mg Oral TID WC  . thiamine  100 mg Oral Daily   Or  . thiamine  100 mg Intravenous Daily  . timolol  1 drop Both Eyes Daily  .  umeclidinium-vilanterol  1 puff Inhalation Daily   Continuous Infusions: . doxycycline (VIBRAMYCIN) IV    . doxycycline (VIBRAMYCIN) IV       LOS: 0 days   Time spent: 40 minutes  Aadit Hagood Loann Quill, MD [facility]  If 7PM-7AM, please contact night-coverage [email or web address] 10/17/2020, 9:24 AM

## 2020-10-18 ENCOUNTER — Inpatient Hospital Stay (HOSPITAL_COMMUNITY): Payer: Medicare Other

## 2020-10-18 DIAGNOSIS — I340 Nonrheumatic mitral (valve) insufficiency: Secondary | ICD-10-CM

## 2020-10-18 DIAGNOSIS — R079 Chest pain, unspecified: Secondary | ICD-10-CM

## 2020-10-18 DIAGNOSIS — N19 Unspecified kidney failure: Secondary | ICD-10-CM

## 2020-10-18 DIAGNOSIS — I2510 Atherosclerotic heart disease of native coronary artery without angina pectoris: Secondary | ICD-10-CM

## 2020-10-18 LAB — TYPE AND SCREEN
ABO/RH(D): A POS
Antibody Screen: NEGATIVE
Unit division: 0
Unit division: 0

## 2020-10-18 LAB — BASIC METABOLIC PANEL
Anion gap: 11 (ref 5–15)
BUN: 81 mg/dL — ABNORMAL HIGH (ref 8–23)
CO2: 27 mmol/L (ref 22–32)
Calcium: 8.5 mg/dL — ABNORMAL LOW (ref 8.9–10.3)
Chloride: 97 mmol/L — ABNORMAL LOW (ref 98–111)
Creatinine, Ser: 6.01 mg/dL — ABNORMAL HIGH (ref 0.61–1.24)
GFR, Estimated: 9 mL/min — ABNORMAL LOW (ref >60)
Glucose, Bld: 104 mg/dL — ABNORMAL HIGH (ref 70–99)
Potassium: 4.2 mmol/L (ref 3.5–5.1)
Sodium: 135 mmol/L (ref 135–145)

## 2020-10-18 LAB — CBC
HCT: 25 % — ABNORMAL LOW (ref 39.0–52.0)
Hemoglobin: 8.7 g/dL — ABNORMAL LOW (ref 13.0–17.0)
MCH: 32.8 pg (ref 26.0–34.0)
MCHC: 34.8 g/dL (ref 30.0–36.0)
MCV: 94.3 fL (ref 80.0–100.0)
Platelets: 156 10*3/uL (ref 150–400)
RBC: 2.65 MIL/uL — ABNORMAL LOW (ref 4.22–5.81)
RDW: 17.2 % — ABNORMAL HIGH (ref 11.5–15.5)
WBC: 7.6 10*3/uL (ref 4.0–10.5)
nRBC: 0 % (ref 0.0–0.2)

## 2020-10-18 LAB — ECHOCARDIOGRAM COMPLETE
AR max vel: 3.53 cm2
AV Area VTI: 3.79 cm2
AV Area mean vel: 3.16 cm2
AV Mean grad: 2 mmHg
AV Peak grad: 4.1 mmHg
Ao pk vel: 1.01 m/s
Height: 74 in
S' Lateral: 5.2 cm
Weight: 2390.4 oz

## 2020-10-18 LAB — BPAM RBC
Blood Product Expiration Date: 202201192359
Blood Product Expiration Date: 202201212359
ISSUE DATE / TIME: 202112250626
ISSUE DATE / TIME: 202112251422
Unit Type and Rh: 6200
Unit Type and Rh: 6200

## 2020-10-18 LAB — GLUCOSE, CAPILLARY
Glucose-Capillary: 126 mg/dL — ABNORMAL HIGH (ref 70–99)
Glucose-Capillary: 134 mg/dL — ABNORMAL HIGH (ref 70–99)
Glucose-Capillary: 135 mg/dL — ABNORMAL HIGH (ref 70–99)
Glucose-Capillary: 148 mg/dL — ABNORMAL HIGH (ref 70–99)
Glucose-Capillary: 149 mg/dL — ABNORMAL HIGH (ref 70–99)
Glucose-Capillary: 230 mg/dL — ABNORMAL HIGH (ref 70–99)

## 2020-10-18 LAB — MAGNESIUM: Magnesium: 2.3 mg/dL (ref 1.7–2.4)

## 2020-10-18 NOTE — Anesthesia Preprocedure Evaluation (Addendum)
Anesthesia Evaluation  Patient identified by MRN, date of birth, ID band Patient awake    Reviewed: Allergy & Precautions, NPO status , Patient's Chart, lab work & pertinent test results, reviewed documented beta blocker date and time   History of Anesthesia Complications Negative for: history of anesthetic complications  Airway Mallampati: I  TM Distance: >3 FB Neck ROM: Full    Dental  (+) Missing, Dental Advisory Given, Poor Dentition   Pulmonary shortness of breath and with exertion, COPD, Current Smoker and Patient abstained from smoking.,  Current smoker, 22 pack year history  10/16/2020 SARS coronavirus NEG   breath sounds clear to auscultation       Cardiovascular hypertension, Pt. on medications and Pt. on home beta blockers (-) angina+ CAD, + Past MI, + Cardiac Stents (stents, PCI 11/2019), + CABG (CABG x2 2004) and +CHF (LVEF 40-45%)   Rhythm:Regular Rate:Normal  10/18/2020 ECHO: EF 40-45% with mild LV dysfunction, hypokinesis of lateral inferior and post walls, mod LV dilation, mod LVH, Grade 2 DD, mild MR   Last echo 01/2019:  1. The left ventricle has mild-moderately reduced systolic function, with  an ejection fraction of 40-45%. The cavity size was normal. Mild basal  septal hypertrophy. Left ventricular diastolic Doppler parameters are  consistent with pseudonormalization.  Elevated left ventricular end-diastolic pressure Left ventricular diffuse  hypokinesis.  2. The right ventricle has normal systolic function. The cavity was  normal. There is no increase in right ventricular wall thickness.  3. Left atrial size was severely dilated.  4. There was trivial MR, TR and PR.    Cath 11/2019:   Prox RCA lesion is 100% stenosed. SVG to RCA is widely patent.  Dist LAD lesion is 90% stenosed. Too small and distal for PCI.  3rd Diag lesion is 50% stenosed.  Prox Cx lesion is 100% stenosed. Left to left  collaterals.  Ramus lesion is 100% stenosed.  SVG to ramus with Origin to Prox Graft lesion is 95% stenosed.  A drug-eluting stent was successfully placed using a SYNERGY XD [id number], postdilated to >4 mm. Optimized with IVUS.  Post intervention, there is a 0% residual stenosis.  There is moderate to severe left ventricular systolic dysfunction.  There is no aortic valve stenosis.  Hemodynamic findings consistent with moderate pulmonary hypertension.  Currently on Brillinta    Neuro/Psych negative neurological ROS  negative psych ROS   GI/Hepatic GERD  Medicated and Controlled,(+)     substance abuse (has not used cocaine in years, 12 drinks/wk)  alcohol use and cocaine use, Hepatitis - (HBV treated? 1968), BEtOH abuse Anemia, melena, hx AVMs and ulcers. Currently on brillinta for stents placed in Feb 2021   Endo/Other  diabetes, Well Controlled, Type 2, Insulin DependentLast a1c 4.4, but has peripheral neuropathy and CKD 2/2 T2DM  Renal/GU ESRF and DialysisRenal diseaseHad missed last 3 dialysis session prior to admission for abdominal pain  negative genitourinary   Musculoskeletal  (+) Arthritis , Osteoarthritis,    Abdominal   Peds  Hematology  (+) Blood dyscrasia, anemia , HIV (diagnosed 1995, stable on antiretrovirals), H/H 8.7/25, plt 156   Anesthesia Other Findings   Reproductive/Obstetrics negative OB ROS                          Anesthesia Physical Anesthesia Plan  ASA: IV  Anesthesia Plan: MAC   Post-op Pain Management:    Induction:   PONV Risk Score and Plan: 2 and Propofol  infusion, TIVA and Treatment may vary due to age or medical condition  Airway Management Planned: Natural Airway and Nasal Cannula  Additional Equipment: None  Intra-op Plan:   Post-operative Plan:   Informed Consent: I have reviewed the patients History and Physical, chart, labs and discussed the procedure including the risks, benefits and  alternatives for the proposed anesthesia with the patient or authorized representative who has indicated his/her understanding and acceptance.     Dental advisory given  Plan Discussed with: CRNA and Surgeon  Anesthesia Plan Comments: ( No active bleeding, Hb stable after 1 unit prbcs transfused on admission for Hb 7)       Anesthesia Quick Evaluation

## 2020-10-18 NOTE — Progress Notes (Signed)
[name], male   DOB: 1950/10/29, 69 y.o.   MRN: [id number]  [facility] Progress Note   Assessment/ Plan:   1.  Abdominal pain/chest pain:  Chest pain is apparently resolved and seen earlier by cardiology who support holding his Brilinta in the setting of GI bleed.  With past history of coronary artery disease status post CABG/PTCA-current troponin rise thought to be from demand ischemia. 2.  End-stage renal disease: Missed hemodialysis over the past week with severe azotemia and mild hyperkalemia seen on admission labs.  Underwent hemodialysis yesterday and will order for hemodialysis again today to try and improve clearance. 3.  GI bleed with acute blood loss Anemia: His hemoglobin level has dropped from 11.5 (on 12/8) to 7.5 yesterday and is even lower down to 6.5 yesterday.  Underwent transfusion of 2 units with corresponding improvement of hemoglobin and hematocrit; he remains n.p.o. without any hematochezia and I suspect that EGD will be undertaken today with Brilinta on hold. 4.  Depression/alcohol abuse: Discussed alcohol cessation and will likely need referral for consideration of antidepressant therapy/counseling as an outpatient. 5.  Secondary hyperparathyroidism: Continue current phosphorus binder (Renvela 2400 mg 3 times daily) along with cinacalcet and calcitriol for PTH suppression. 6.  Hypertension:  Resumed oral antihypertensive therapy with cautious ultrafiltration with dialysis. 7.  HIV infection: Resumed back on Biktarvy 1 tablet daily.  Subjective:   Underwent hemodialysis yesterday without problems.  This morning complains that he is hungry "I have not eaten in 2 days".   Objective:   BP (!) 144/81 (BP Location: Right Arm)   Pulse 80   Temp 97.6 F (36.4 C) (Oral)   Resp 16   Ht 6\' 2"  (1.88 m)   Wt 67.8 kg   SpO2 94%   BMI 19.18 kg/m   Physical Exam: Gen: Chronically ill-appearing man, appears uncomfortable resting in  bed-intermittently groaning CVS: Pulse regular rhythm, normal rate, 3/6 ESM over outflow tract. Resp: Decreased breath sounds over bases, no distinct rales or rhonchi Abd: Soft, flat, mild tenderness over epigastric area. Ext: Left brachiocephalic fistula with palpable thrill.  No pedal edema.  Labs: BMET Recent Labs  Lab 10/16/20 1807 10/17/20 0139 10/17/20 1401 10/18/20 0231  NA 133*  --  128* 135  K 6.2*  --  4.2 4.2  CL 95*  --  93* 97*  CO2 13*  --  21* 27  GLUCOSE 197*  --  296* 104*  BUN 203*  --  154* 81*  CREATININE 8.92* 8.61* 6.76* 6.01*  CALCIUM 8.0*  --  7.6* 8.5*  PHOS  --   --  5.6*  --    CBC Recent Labs  Lab 10/16/20 1807 10/17/20 0139 10/17/20 1139 10/18/20 0231  WBC 10.1 7.5 7.5 7.6  HGB 7.5* 6.5* 7.0* 8.7*  HCT 22.0* 19.9* 21.3* 25.0*  MCV 96.1 97.5 95.5 94.3  PLT 188 195 166 156      Medications:    . sodium chloride   Intravenous Once  . allopurinol  100 mg Oral Daily  . aspirin  81 mg Oral Daily  . atorvastatin  40 mg Oral Daily  . bictegravir-emtricitabine-tenofovir AF  1 tablet Oral Daily  . [START ON 10/19/2020] calcitRIOL  3 mcg Oral Q M,W,F-HD  . carvedilol  3.125 mg Oral BID WC  . Chlorhexidine Gluconate Cloth  6 each Topical Q0600  . [START ON 10/19/2020] cinacalcet  90 mg Oral Q M,W,F-1800  . [START ON 10/23/2020] darbepoetin (ARANESP)  injection - DIALYSIS  100 mcg Intravenous Q Fri-HD  . folic acid  1 mg Oral Daily  . gabapentin  300 mg Oral BID  . insulin aspart  0-6 Units Subcutaneous Q4H  . isosorbide mononitrate  30 mg Oral Daily  . latanoprost  1 drop Both Eyes QHS  . multivitamin  1 tablet Oral QHS  . pantoprazole (PROTONIX) IV  40 mg Intravenous Q12H  . sevelamer carbonate  2,400 mg Oral TID WC  . thiamine  100 mg Oral Daily   Or  . thiamine  100 mg Intravenous Daily  . timolol  1 drop Both Eyes Daily  . umeclidinium-vilanterol  1 puff Inhalation Daily   Elmarie Shiley, MD 10/18/2020, 9:10 AM

## 2020-10-18 NOTE — Progress Notes (Signed)
PROGRESS NOTE    Jacob [name]  TFT:[id number] DOB: 1951-10-22 DOA: 10/16/2020 PCP: [name], NP   Brief Narrative:  Jacob [name] is a 69 y.o. male with history of CAD status post stenting last 1 was in February 2021 presently on Brilinta which patient has not taken for last couple of days with history of ESRD on hemodialysis missed last 3 sessions patient is also depressed and has been drinking a lot of alcohol last 1 week presents to the ER with complaints of having missed dialysis.  In addition patient also states he has been having some left-sided chest pain and abdominal pain mostly in the epigastric area.    ED Course: In the ER patient was tachycardic with EKG showing ST depression and T wave inversions in the lateral leads.  Initial troponin was 68 and second on a 73.  Hemoglobin is around 7.5 potassium 6.2 bicarb 13 given the patient's symptoms of chest pain and abdominal pain CT angiogram of the chest abdomen pelvis was done which shows nonspecific peribronchial groundglass opacities in the right lower lobe and left upper lobe.  There is also severe narrowing of the superior mesenteric artery.  On-call nephrologist Dr. Graylon Gunning was consulted.  At this time Dr. Posey Pronto has arranged for dialysis and also given D50 and IV insulin and calcium gluconate.  At the time of my exam patient is chest pain-free.  Covid test is negative.  Patient remains tachycardic.  His hemoglobin noted to be 6.5-1 unit PRBC ordered.  Patient has history of AVMs.  GI consulted.   Assessment & Plan:   NSTEMI/demand ischemia: -Patient has history of coronary artery disease status post two-vessel bypass in 2004 followed by recent PCI and drug-eluting stent in February 2021. -Patient presented with chest pain and elevated troponin.  Troponin trended up from 73-222-437.  EKG shows ST-T wave changes in lateral leads-likely demand ischemia in the setting of acute blood loss anemia -Cardiology  consulted-appreciate help. -Echo is ordered and is pending. -Continue aspirin, beta-blocker, statin, Imdur.  Hold Brilinta  in the setting of acute GI bleed.  Acute blood loss anemia: -In the setting of upper GI bleed. -Patient reports melena since 2 days.  H&H dropped to 6.5/19.9 -Status post 2 unit PRBC transfusion.  Hemoglobin 8.7. -EGD in February 2021 revealing angiodysplastic lesions with bleeding in the gastric fundus and body. -GI consulted-appreciate help.   -Patient is n.p.o. for possible EGD today. -Monitor H&H closely.  Continue to hold Brilinta at this time. -Continue IV PPI.  ESRD on hemodialysis: Patient has been noncompliant with his dialysis last week. -Patient hyperkalemic and hypertensive upon arrival. -Nephrology consulted for dialysis-appreciate help  Hyperkalemia: In the setting of ESRD and missed hemodialysis -He received insulin, D50 and calcium gluconate in the ED.   -Resolved  CAP?: -CTA chest concerning for vague peribronchovascular groundglass airspace opacities which are nodular like within the right lower lobe and left upper lobe-findings could represent infection/inflammation.  PCT: 1.04, LDH: WNL -Patient is currently maintaining oxygen saturation on room air.  Patient is started on doxycycline considering his immunocompromised status in the setting of HIV -We will continue IV antibiotics for 5 days.  Patient is afebrile with no leukocytosis.  History of alcohol abuse: -Continue multivitamin, folic acid and thiamine. -On CIWA protocol. -Monitor electrolytes and monitor for DTs.  Hypertension: Pressure stable this morning.   Continue Coreg and Imdur.  Monitor blood pressure closely  Diabetes mellitus: Last A1c 4.4% - 6 months ago.  We will repeat A1c -Continue sliding scale insulin  Diabetes neuropathy: Continue gabapentin  Hyperlipidemia: Continue statin  HIV: Continue Biktarvy  DVT prophylaxis: SCD Code Status: Full code Family  Communication:  None present at bedside.  Plan of care discussed with patient in length and he verbalized understanding and agreed with it. Disposition Plan: To be determined  Consultants:  Cardiology GI Nephrology   procedures:   CTA chest  Antimicrobials:   Doxy  Status is: Inpatient  Remains inpatient appropriate because:Ongoing diagnostic testing needed not appropriate for outpatient work up   Dispo: The patient is from: Home              Anticipated d/c is to: Home              Anticipated d/c date is: 3 days              Patient currently is not medically stable to d/c.   Subjective: Patient seen and examined.  Sitting comfortably on the bed.  Tells me that he is frustrated as he is hungry and ate cookie this morning which he brought from his home.  Denies any complaints including chest pain, shortness of breath, palpitation, leg swelling, lightheadedness, dizziness, further episodes of hematochezia/melena.  Objective: Vitals:   10/17/20 2359 10/18/20 0000 10/18/20 0340 10/18/20 0734  BP: 139/65 139/65 (!) 156/86 (!) 144/81  Pulse: 93 93 91 80  Resp: 18  18 16   Temp: 97.6 F (36.4 C)  97.8 F (36.6 C) 97.6 F (36.4 C)  TempSrc: Oral  Oral Oral  SpO2: 99%  100% 94%  Weight:   67.8 kg   Height:        Intake/Output Summary (Last 24 hours) at 10/18/2020 1059 Last data filed at 10/18/2020 0300 Gross per 24 hour  Intake 805 ml  Output 2000 ml  Net -1195 ml   Filed Weights   10/17/20 1330 10/17/20 1650 10/18/20 0340  Weight: 80 kg 78 kg 67.8 kg    Examination:  General exam: Appears calm and comfortable, on room air, thin and lean and frustrated. Respiratory system: Clear to auscultation. Respiratory effort normal. Cardiovascular system: S1 & S2 heard, RRR. No JVD, murmurs, rubs, gallops or clicks. No pedal edema. Gastrointestinal system: Abdomen is nondistended, soft and nontender. No organomegaly or masses felt. Normal bowel sounds heard. Central  nervous system: Alert and oriented. No focal neurological deficits. Extremities: Symmetric 5 x 5 power. Skin: No rashes, lesions or ulcers   Data Reviewed: I have personally reviewed following labs and imaging studies  CBC: Recent Labs  Lab 10/16/20 1807 10/17/20 0139 10/17/20 1139 10/18/20 0231  WBC 10.1 7.5 7.5 7.6  HGB 7.5* 6.5* 7.0* 8.7*  HCT 22.0* 19.9* 21.3* 25.0*  MCV 96.1 97.5 95.5 94.3  PLT 188 195 166 998   Basic Metabolic Panel: Recent Labs  Lab 10/16/20 1807 10/17/20 0139 10/17/20 1401 10/18/20 0231  NA 133*  --  128* 135  K 6.2*  --  4.2 4.2  CL 95*  --  93* 97*  CO2 13*  --  21* 27  GLUCOSE 197*  --  296* 104*  BUN 203*  --  154* 81*  CREATININE 8.92* 8.61* 6.76* 6.01*  CALCIUM 8.0*  --  7.6* 8.5*  MG  --   --   --  2.3  PHOS  --   --  5.6*  --    GFR: Estimated Creatinine Clearance: 10.8 mL/min (A) (by C-G formula based on SCr  of 6.01 mg/dL (H)). Liver Function Tests: Recent Labs  Lab 10/16/20 1807 10/17/20 1401  AST 32  --   ALT 20  --   ALKPHOS 80  --   BILITOT 0.4  --   PROT 7.0  --   ALBUMIN 3.3* 2.8*   Recent Labs  Lab 10/16/20 1807  LIPASE 70*   No results for input(s): AMMONIA in the last 168 hours. Coagulation Profile: No results for input(s): INR, PROTIME in the last 168 hours. Cardiac Enzymes: No results for input(s): CKTOTAL, CKMB, CKMBINDEX, TROPONINI in the last 168 hours. BNP (last 3 results) No results for input(s): PROBNP in the last 8760 hours. HbA1C: No results for input(s): HGBA1C in the last 72 hours. CBG: Recent Labs  Lab 10/17/20 1727 10/17/20 2013 10/18/20 0001 10/18/20 0347 10/18/20 0731  GLUCAP 122* 201* 148* 134* 149*   Lipid Profile: No results for input(s): CHOL, HDL, LDLCALC, TRIG, CHOLHDL, LDLDIRECT in the last 72 hours. Thyroid Function Tests: Recent Labs    10/17/20 0139  TSH 0.748   Anemia Panel: No results for input(s): VITAMINB12, FOLATE, FERRITIN, TIBC, IRON, RETICCTPCT in the last  72 hours. Sepsis Labs: Recent Labs  Lab 10/17/20 0547  PROCALCITON 1.04    Recent Results (from the past 240 hour(s))  Resp Panel by RT-PCR (Flu A&B, Covid) Nasopharyngeal Swab     Status: None   Collection Time: 10/16/20  7:01 PM   Specimen: Nasopharyngeal Swab; Nasopharyngeal(NP) swabs in vial transport medium  Result Value Ref Range Status   SARS Coronavirus 2 by RT PCR NEGATIVE NEGATIVE Final    Comment: (NOTE) SARS-CoV-2 target nucleic acids are NOT DETECTED.  The SARS-CoV-2 RNA is generally detectable in upper respiratory specimens during the acute phase of infection. The lowest concentration of SARS-CoV-2 viral copies this assay can detect is 138 copies/mL. A negative result does not preclude SARS-Cov-2 infection and should not be used as the sole basis for treatment or other patient management decisions. A negative result may occur with  improper specimen collection/handling, submission of specimen other than nasopharyngeal swab, presence of viral mutation(s) within the areas targeted by this assay, and inadequate number of viral copies(<138 copies/mL). A negative result must be combined with clinical observations, patient history, and epidemiological information. The expected result is Negative.  Fact Sheet for Patients:  EntrepreneurPulse.com.au  Fact Sheet for Healthcare Providers:  IncredibleEmployment.be  This test is no t yet approved or cleared by the [place] FDA and  has been authorized for detection and/or diagnosis of SARS-CoV-2 by FDA under an Emergency Use Authorization (EUA). This EUA will remain  in effect (meaning this test can be used) for the duration of the COVID-19 declaration under Section 564(b)(1) of the Act, 21 U.S.C.section 360bbb-3(b)(1), unless the authorization is terminated  or revoked sooner.       Influenza A by PCR NEGATIVE NEGATIVE Final   Influenza B by PCR NEGATIVE NEGATIVE Final     Comment: (NOTE) The Xpert Xpress SARS-CoV-2/FLU/RSV plus assay is intended as an aid in the diagnosis of influenza from Nasopharyngeal swab specimens and should not be used as a sole basis for treatment. Nasal washings and aspirates are unacceptable for Xpert Xpress SARS-CoV-2/FLU/RSV testing.  Fact Sheet for Patients: EntrepreneurPulse.com.au  Fact Sheet for Healthcare Providers: IncredibleEmployment.be  This test is not yet approved or cleared by the [place] FDA and has been authorized for detection and/or diagnosis of SARS-CoV-2 by FDA under an Emergency Use Authorization (EUA). This EUA will remain in  effect (meaning this test can be used) for the duration of the COVID-19 declaration under Section 564(b)(1) of the Act, 21 U.S.C. section 360bbb-3(b)(1), unless the authorization is terminated or revoked.  Performed at [facility], [place] [address] [place], [place] [address]       Radiology Studies: DG Chest Port 1 View  Result Date: 10/16/2020 CLINICAL DATA:  Shortness of breath. EXAM: PORTABLE CHEST 1 VIEW COMPARISON:  Chest x-ray 03/11/2020 FINDINGS: Slightly more prominent cardiac silhouette likely due to AP portable technique. Otherwise the heart size and mediastinal contours are unchanged. Aortic arch calcifications. Marked calcifications of the major vessels originating from the aortic arch. Coronary artery stent. No focal consolidation. No pulmonary edema. No pleural effusion. No pneumothorax. No acute osseous abnormality. Old left rib fractures. Old right rib fractures. IMPRESSION: No active disease. Electronically Signed   By: Iven Finn M.D.   On: 10/16/2020 19:16   CT Angio Chest/Abd/Pel for Dissection W and/or Wo Contrast  Result Date: 10/16/2020 CLINICAL DATA:  Abdominal pain, aortic dissection suspected. EXAM: CT ANGIOGRAPHY CHEST, ABDOMEN AND PELVIS TECHNIQUE: Non-contrast CT of the chest was initially obtained.  Multidetector CT imaging through the chest, abdomen and pelvis was performed using the standard protocol during bolus administration of intravenous contrast. Multiplanar reconstructed images and MIPs were obtained and reviewed to evaluate the vascular anatomy. CONTRAST:  188mL OMNIPAQUE IOHEXOL 350 MG/ML SOLN COMPARISON:  CT abdomen pelvis 11/03/2015, CT abdomen pelvis 06/14/2006 FINDINGS: VASCULAR Aorta: Moderate to severe calcified and noncalcified atherosclerotic plaque. Normal caliber aorta without aneurysm, dissection, vasculitis or significant stenosis. Celiac: Mild atherosclerotic plaque. Patent without evidence of aneurysm, dissection, vasculitis or significant stenosis. SMA: Severe atherosclerotic plaque with severe narrowing of the origin. Patent without evidence of aneurysm, dissection, vasculitis. Renals: At least moderate atherosclerotic plaque with mild to moderate stenosis of the origins. Both renal arteries are patent without evidence of aneurysm, dissection, vasculitis, fibromuscular dysplasia or significant stenosis. IMA: Patent without evidence of aneurysm, dissection, vasculitis or significant stenosis. Inflow: Moderate to severe atherosclerotic plaque. Patent without evidence of aneurysm, dissection, vasculitis or significant stenosis. Veins: No obvious venous abnormality within the limitations of this arterial phase study. Review of the MIP images confirms the above findings. NON-VASCULAR CTA CHEST FINDINGS Cardiovascular: Preferential opacification of the thoracic aorta. No evidence of thoracic aortic aneurysm or dissection. Normal heart size. No significant pericardial effusion. At least moderate four-vessel coronary artery calcifications status post coronary artery bypass. The main pulmonary artery is normal in caliber. No central pulmonary embolus. Mediastinum/Nodes: No enlarged mediastinal, hilar, or axillary lymph nodes. Thyroid gland, trachea, and esophagus demonstrate no significant  findings. Lungs/Pleura: Right lower lobe scattered peribronchovascular ground-glass airspace opacities (6:35, 47, 49). Nodular-like region within the right lower lobe measures up to 1.3 cm. Similar finding within left upper lobe (6:25). Nodular-ike region within the left upper lobe measures up to 7 mm. No pulmonary mass. No pleural effusion. No pneumothorax. Musculoskeletal: No chest wall abnormality No suspicious lytic or blastic osseous lesions. No acute displaced fracture. CTA ABDOMEN AND PELVIS FINDINGS Hepatobiliary: No focal liver abnormality is seen. Status post cholecystectomy. No biliary dilatation. Pancreas: No focal lesion. Normal pancreatic contour. No surrounding inflammatory changes. No main pancreatic ductal dilatation. Spleen: Normal in size without focal abnormality. Adrenals/Urinary Tract: No adrenal nodule bilaterally. Bilateral kidneys enhance symmetrically. Lobulated renal parenchyma with associated scarring and suggestion of innumerable cystic lesions. Suggestion of ill-defined underlying renal lesions of which measure simple fluid (7:164, 161). Subcentimeter hypodensities are too small to characterize.  No hydronephrosis. No hydroureter. The urinary bladder is unremarkable. Stomach/Bowel: Stomach is within normal limits. No evidence of bowel wall thickening or dilatation. Scattered sigmoid diverticulosis. Appendix appears normal. Lymphatic: No abdominal, pelvic, or inguinal lymphadenopathy. Reproductive: Prostate is unremarkable. Other: No intraperitoneal free fluid. No intraperitoneal free gas. No organized fluid collection. Musculoskeletal: Small fat containing left inguinal hernia. Possible tiny fat containing right inguinal hernia. No suspicious lytic or blastic osseous lesions. No acute displaced fracture. Old healed left rib fractures. Old healed left rib fractures. Multilevel degenerative changes of the spine with intervertebral disc space narrowing and vacuum phenomenon at the L4-L5 and  L5-S1 levels. Trace retrolisthesis of L5 on S1. Review of the MIP images confirms the above findings. IMPRESSION: VASCULAR: 1. No aortic aneurysm or dissection. Aortic Atherosclerosis (ICD10-I70.0). 2. 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 NON-VASCULAR: 1. Vague peribronchovascular ground-glass airspace opacities, some of which are nodular-like, within the right lower lobe and left upper lobes. Findings could represent infection/inflammation with COVID 19 infection not excluded. Non-contrast chest CT at 3-6 months is recommended. If the nodules are stable at time of repeat CT, then future CT at 18-24 months (from today's scan) is considered optional for low-risk patients, but is recommended for high-risk patients. This recommendation follows the consensus statement: Guidelines for Management of Incidental Pulmonary Nodules Detected on CT Images: From the Fleischner Society 2017; Radiology 2017; 284:228-243. 2. Interval development of multiple renal cystic lesions that are poorly visualized/evaluated. Recommend dedicated cross-sectional imaging renal protocol for further evaluation. 3. Scattered sigmoid diverticulosis with no acute diverticulitis. 4. Bilateral fat containing inguinal hernias. Electronically Signed   By: Iven Finn M.D.   On: 10/16/2020 21:10    Scheduled Meds: . sodium chloride   Intravenous Once  . allopurinol  100 mg Oral Daily  . aspirin  81 mg Oral Daily  . atorvastatin  40 mg Oral Daily  . bictegravir-emtricitabine-tenofovir AF  1 tablet Oral Daily  . [START ON 10/19/2020] calcitRIOL  3 mcg Oral Q M,W,F-HD  . carvedilol  3.125 mg Oral BID WC  . Chlorhexidine Gluconate Cloth  6 each Topical Q0600  . [START ON 10/19/2020] cinacalcet  90 mg Oral Q M,W,F-1800  . [START ON 10/23/2020] darbepoetin (ARANESP) injection - DIALYSIS  100 mcg Intravenous Q Fri-HD  . folic acid  1 mg Oral Daily  .  gabapentin  300 mg Oral BID  . insulin aspart  0-6 Units Subcutaneous Q4H  . isosorbide mononitrate  30 mg Oral Daily  . latanoprost  1 drop Both Eyes QHS  . multivitamin  1 tablet Oral QHS  . pantoprazole (PROTONIX) IV  40 mg Intravenous Q12H  . sevelamer carbonate  2,400 mg Oral TID WC  . thiamine  100 mg Oral Daily   Or  . thiamine  100 mg Intravenous Daily  . timolol  1 drop Both Eyes Daily  . umeclidinium-vilanterol  1 puff Inhalation Daily   Continuous Infusions: . doxycycline (VIBRAMYCIN) IV 100 mg (10/18/20 0627)     LOS: 1 day   Time spent: 40 minutes  Benay Pomeroy Loann Quill, MD [facility]  If 7PM-7AM, please contact night-coverage [email or web address] 10/18/2020, 10:59 AM

## 2020-10-18 NOTE — Progress Notes (Signed)
Jacob [name]  CC:  Melena, anemia  Subjective:  No further BM or sign of bleeding since admitted.  He is not happy about having to remain on clear liquid but really no other complaints.  Objective:  Vital signs in last 24 hours: Temp:  [97.6 F (36.4 C)-98.1 F (36.7 C)] 97.6 F (36.4 C) (12/26 0734) Pulse Rate:  [80-104] 80 (12/26 0734) Resp:  [10-18] 16 (12/26 0734) BP: (139-181)/(64-88) 144/81 (12/26 0734) SpO2:  [94 %-100 %] 94 % (12/26 0734) Weight:  [67.8 kg-80 kg] 67.8 kg (12/26 0340) Last BM Date: 10/16/20 General:  Alert, Well-developed, in NAD Heart:  Regular rate and rhythm; no murmurs Pulm:  CTAB.  No W/R/R. Abdomen:  Soft, non-distended.  BS present.  Non-tender. Extremities:  Without edema. Neurologic:  Alert and oriented x 4;  grossly normal neurologically. Psych:  Alert and cooperative. Normal mood and affect.  Intake/Output from previous day: 12/25 0701 - 12/26 0700 In: 1215 [P.O.:240; Blood:725; IV Piggyback:250] Out: 2000   Lab Results: Recent Labs    10/17/20 0139 10/17/20 1139 10/18/20 0231  WBC 7.5 7.5 7.6  HGB 6.5* 7.0* 8.7*  HCT 19.9* 21.3* 25.0*  PLT 195 166 156   BMET Recent Labs    10/16/20 1807 10/17/20 0139 10/17/20 1401 10/18/20 0231  NA 133*  --  128* 135  K 6.2*  --  4.2 4.2  CL 95*  --  93* 97*  CO2 13*  --  21* 27  GLUCOSE 197*  --  296* 104*  BUN 203*  --  154* 81*  CREATININE 8.92* 8.61* 6.76* 6.01*  CALCIUM 8.0*  --  7.6* 8.5*   LFT Recent Labs    10/16/20 1807 10/17/20 1401  PROT 7.0  --   ALBUMIN 3.3* 2.8*  AST 32  --   ALT 20  --   ALKPHOS 80  --   BILITOT 0.4  --    DG Chest Port 1 View  Result Date: 10/16/2020 CLINICAL DATA:  Shortness of breath. EXAM: PORTABLE CHEST 1 VIEW COMPARISON:  Chest x-ray 03/11/2020 FINDINGS: Slightly more prominent cardiac silhouette likely due to AP portable technique. Otherwise the heart size and mediastinal contours are unchanged. Aortic  arch calcifications. Marked calcifications of the major vessels originating from the aortic arch. Coronary artery stent. No focal consolidation. No pulmonary edema. No pleural effusion. No pneumothorax. No acute osseous abnormality. Old left rib fractures. Old right rib fractures. IMPRESSION: No active disease. Electronically Signed   By: Iven Finn M.D.   On: 10/16/2020 19:16   CT Angio Chest/Abd/Pel for Dissection W and/or Wo Contrast  Result Date: 10/16/2020 CLINICAL DATA:  Abdominal pain, aortic dissection suspected. EXAM: CT ANGIOGRAPHY CHEST, ABDOMEN AND PELVIS TECHNIQUE: Non-contrast CT of the chest was initially obtained. Multidetector CT imaging through the chest, abdomen and pelvis was performed using the standard protocol during bolus administration of intravenous contrast. Multiplanar reconstructed images and MIPs were obtained and reviewed to evaluate the vascular anatomy. CONTRAST:  180mL OMNIPAQUE IOHEXOL 350 MG/ML SOLN COMPARISON:  CT abdomen pelvis 11/03/2015, CT abdomen pelvis 06/14/2006 FINDINGS: VASCULAR Aorta: Moderate to severe calcified and noncalcified atherosclerotic plaque. Normal caliber aorta without aneurysm, dissection, vasculitis or significant stenosis. Celiac: Mild atherosclerotic plaque. Patent without evidence of aneurysm, dissection, vasculitis or significant stenosis. SMA: Severe atherosclerotic plaque with severe narrowing of the origin. Patent without evidence of aneurysm, dissection, vasculitis. Renals: At least moderate atherosclerotic plaque with mild to moderate stenosis of the origins.  Both renal arteries are patent without evidence of aneurysm, dissection, vasculitis, fibromuscular dysplasia or significant stenosis. IMA: Patent without evidence of aneurysm, dissection, vasculitis or significant stenosis. Inflow: Moderate to severe atherosclerotic plaque. Patent without evidence of aneurysm, dissection, vasculitis or significant stenosis. Veins: No obvious venous  abnormality within the limitations of this arterial phase study. Review of the MIP images confirms the above findings. NON-VASCULAR CTA CHEST FINDINGS Cardiovascular: Preferential opacification of the thoracic aorta. No evidence of thoracic aortic aneurysm or dissection. Normal heart size. No significant pericardial effusion. At least moderate four-vessel coronary artery calcifications status post coronary artery bypass. The main pulmonary artery is normal in caliber. No central pulmonary embolus. Mediastinum/Nodes: No enlarged mediastinal, hilar, or axillary lymph nodes. Thyroid gland, trachea, and esophagus demonstrate no significant findings. Lungs/Pleura: Right lower lobe scattered peribronchovascular ground-glass airspace opacities (6:35, 47, 49). Nodular-like region within the right lower lobe measures up to 1.3 cm. Similar finding within left upper lobe (6:25). Nodular-ike region within the left upper lobe measures up to 7 mm. No pulmonary mass. No pleural effusion. No pneumothorax. Musculoskeletal: No chest wall abnormality No suspicious lytic or blastic osseous lesions. No acute displaced fracture. CTA ABDOMEN AND PELVIS FINDINGS Hepatobiliary: No focal liver abnormality is seen. Status post cholecystectomy. No biliary dilatation. Pancreas: No focal lesion. Normal pancreatic contour. No surrounding inflammatory changes. No main pancreatic ductal dilatation. Spleen: Normal in size without focal abnormality. Adrenals/Urinary Tract: No adrenal nodule bilaterally. Bilateral kidneys enhance symmetrically. Lobulated renal parenchyma with associated scarring and suggestion of innumerable cystic lesions. Suggestion of ill-defined underlying renal lesions of which measure simple fluid (7:164, 161). Subcentimeter hypodensities are too small to characterize. No hydronephrosis. No hydroureter. The urinary bladder is unremarkable. Stomach/Bowel: Stomach is within normal limits. No evidence of bowel wall thickening or  dilatation. Scattered sigmoid diverticulosis. Appendix appears normal. Lymphatic: No abdominal, pelvic, or inguinal lymphadenopathy. Reproductive: Prostate is unremarkable. Other: No intraperitoneal free fluid. No intraperitoneal free gas. No organized fluid collection. Musculoskeletal: Small fat containing left inguinal hernia. Possible tiny fat containing right inguinal hernia. No suspicious lytic or blastic osseous lesions. No acute displaced fracture. Old healed left rib fractures. Old healed left rib fractures. Multilevel degenerative changes of the spine with intervertebral disc space narrowing and vacuum phenomenon at the L4-L5 and L5-S1 levels. Trace retrolisthesis of L5 on S1. Review of the MIP images confirms the above findings. IMPRESSION: VASCULAR: 1. No aortic aneurysm or dissection. Aortic Atherosclerosis (ICD10-I70.0). 2. 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 NON-VASCULAR: 1. Vague peribronchovascular ground-glass airspace opacities, some of which are nodular-like, within the right lower lobe and left upper lobes. Findings could represent infection/inflammation with COVID 19 infection not excluded. Non-contrast chest CT at 3-6 months is recommended. If the nodules are stable at time of repeat CT, then future CT at 18-24 months (from today's scan) is considered optional for low-risk patients, but is recommended for high-risk patients. This recommendation follows the consensus statement: Guidelines for Management of Incidental Pulmonary Nodules Detected on CT Images: From the Fleischner Society 2017; Radiology 2017; 284:228-243. 2. Interval development of multiple renal cystic lesions that are poorly visualized/evaluated. Recommend dedicated cross-sectional imaging renal protocol for further evaluation. 3. Scattered sigmoid diverticulosis with no acute diverticulitis. 4. Bilateral fat containing inguinal hernias.  Electronically Signed   By: Iven Finn M.D.   On: 10/16/2020 21:10   Assessment / Plan: *69 year old male with a history of CAD with coronary stenting in February, on  Brilinta, history of end-stage renal disease on HD, missing last 3 dialysis sessions, recent alcohol use, complaining of chest pain, abdominal pain and melena for 2 days.  He has a history of gastric and duodenal AVMs.  Received one unit PRBCs and this AM Hgb stable at 8.7 grams.  Does not appear that he is having significantly active bleeding right now.  Needs an upper endoscopy to clear his upper tract, suspect he likely has recurrent AVM bleeding in the setting of Brilinta.  We were/are awaiting cardiac clearance.  He is supposed to get an ECHO today.  - IV protonix 40mg  BID - clear liquid diet okay for now, NPO after MN again for EGD on Monday. - Planning tentatively for EGD on 12/27 pending ECHO results.  Orders have been placed. - trend Hgb and transfuse further if needed.   LOS: 1 day   Jacob [name]. Jacob [name]  10/18/2020, 11:54 AM

## 2020-10-18 NOTE — Progress Notes (Signed)
  Echocardiogram 2D Echocardiogram has been performed.  Jacob [name] 10/18/2020, 2:19 PM

## 2020-10-18 NOTE — Progress Notes (Signed)
Pt. Refusing to come to HD per primary nurse Luetta Nutting, RN. Pt. States "I had dialysis yesterday". Pt regular HD MWF. Dr. Posey Pronto aware ok to run pt. On MWF schedule.

## 2020-10-18 NOTE — H&P (View-Only) (Signed)
[facility] Progress Note  CC:  Melena, anemia  Subjective:  No further BM or sign of bleeding since admitted.  He is not happy about having to remain on clear liquid but really no other complaints.  Objective:  Vital signs in last 24 hours: Temp:  [97.6 F (36.4 C)-98.1 F (36.7 C)] 97.6 F (36.4 C) (12/26 0734) Pulse Rate:  [80-104] 80 (12/26 0734) Resp:  [10-18] 16 (12/26 0734) BP: (139-181)/(64-88) 144/81 (12/26 0734) SpO2:  [94 %-100 %] 94 % (12/26 0734) Weight:  [67.8 kg-80 kg] 67.8 kg (12/26 0340) Last BM Date: 10/16/20 General:  Alert, Well-developed, in NAD Heart:  Regular rate and rhythm; no murmurs Pulm:  CTAB.  No W/R/R. Abdomen:  Soft, non-distended.  BS present.  Non-tender. Extremities:  Without edema. Neurologic:  Alert and oriented x 4;  grossly normal neurologically. Psych:  Alert and cooperative. Normal mood and affect.  Intake/Output from previous day: 12/25 0701 - 12/26 0700 In: 1215 [P.O.:240; Blood:725; IV Piggyback:250] Out: 2000   Lab Results: Recent Labs    10/17/20 0139 10/17/20 1139 10/18/20 0231  WBC 7.5 7.5 7.6  HGB 6.5* 7.0* 8.7*  HCT 19.9* 21.3* 25.0*  PLT 195 166 156   BMET Recent Labs    10/16/20 1807 10/17/20 0139 10/17/20 1401 10/18/20 0231  NA 133*  --  128* 135  K 6.2*  --  4.2 4.2  CL 95*  --  93* 97*  CO2 13*  --  21* 27  GLUCOSE 197*  --  296* 104*  BUN 203*  --  154* 81*  CREATININE 8.92* 8.61* 6.76* 6.01*  CALCIUM 8.0*  --  7.6* 8.5*   LFT Recent Labs    10/16/20 1807 10/17/20 1401  PROT 7.0  --   ALBUMIN 3.3* 2.8*  AST 32  --   ALT 20  --   ALKPHOS 80  --   BILITOT 0.4  --    DG Chest Port 1 View  Result Date: 10/16/2020 CLINICAL DATA:  Shortness of breath. EXAM: PORTABLE CHEST 1 VIEW COMPARISON:  Chest x-ray 03/11/2020 FINDINGS: Slightly more prominent cardiac silhouette likely due to AP portable technique. Otherwise the heart size and mediastinal contours are unchanged. Aortic  arch calcifications. Marked calcifications of the major vessels originating from the aortic arch. Coronary artery stent. No focal consolidation. No pulmonary edema. No pleural effusion. No pneumothorax. No acute osseous abnormality. Old left rib fractures. Old right rib fractures. IMPRESSION: No active disease. Electronically Signed   By: Iven Finn M.D.   On: 10/16/2020 19:16   CT Angio Chest/Abd/Pel for Dissection W and/or Wo Contrast  Result Date: 10/16/2020 CLINICAL DATA:  Abdominal pain, aortic dissection suspected. EXAM: CT ANGIOGRAPHY CHEST, ABDOMEN AND PELVIS TECHNIQUE: Non-contrast CT of the chest was initially obtained. Multidetector CT imaging through the chest, abdomen and pelvis was performed using the standard protocol during bolus administration of intravenous contrast. Multiplanar reconstructed images and MIPs were obtained and reviewed to evaluate the vascular anatomy. CONTRAST:  125mL OMNIPAQUE IOHEXOL 350 MG/ML SOLN COMPARISON:  CT abdomen pelvis 11/03/2015, CT abdomen pelvis 06/14/2006 FINDINGS: VASCULAR Aorta: Moderate to severe calcified and noncalcified atherosclerotic plaque. Normal caliber aorta without aneurysm, dissection, vasculitis or significant stenosis. Celiac: Mild atherosclerotic plaque. Patent without evidence of aneurysm, dissection, vasculitis or significant stenosis. SMA: Severe atherosclerotic plaque with severe narrowing of the origin. Patent without evidence of aneurysm, dissection, vasculitis. Renals: At least moderate atherosclerotic plaque with mild to moderate stenosis of the origins.  Both renal arteries are patent without evidence of aneurysm, dissection, vasculitis, fibromuscular dysplasia or significant stenosis. IMA: Patent without evidence of aneurysm, dissection, vasculitis or significant stenosis. Inflow: Moderate to severe atherosclerotic plaque. Patent without evidence of aneurysm, dissection, vasculitis or significant stenosis. Veins: No obvious venous  abnormality within the limitations of this arterial phase study. Review of the MIP images confirms the above findings. NON-VASCULAR CTA CHEST FINDINGS Cardiovascular: Preferential opacification of the thoracic aorta. No evidence of thoracic aortic aneurysm or dissection. Normal heart size. No significant pericardial effusion. At least moderate four-vessel coronary artery calcifications status post coronary artery bypass. The main pulmonary artery is normal in caliber. No central pulmonary embolus. Mediastinum/Nodes: No enlarged mediastinal, hilar, or axillary lymph nodes. Thyroid gland, trachea, and esophagus demonstrate no significant findings. Lungs/Pleura: Right lower lobe scattered peribronchovascular ground-glass airspace opacities (6:35, 47, 49). Nodular-like region within the right lower lobe measures up to 1.3 cm. Similar finding within left upper lobe (6:25). Nodular-ike region within the left upper lobe measures up to 7 mm. No pulmonary mass. No pleural effusion. No pneumothorax. Musculoskeletal: No chest wall abnormality No suspicious lytic or blastic osseous lesions. No acute displaced fracture. CTA ABDOMEN AND PELVIS FINDINGS Hepatobiliary: No focal liver abnormality is seen. Status post cholecystectomy. No biliary dilatation. Pancreas: No focal lesion. Normal pancreatic contour. No surrounding inflammatory changes. No main pancreatic ductal dilatation. Spleen: Normal in size without focal abnormality. Adrenals/Urinary Tract: No adrenal nodule bilaterally. Bilateral kidneys enhance symmetrically. Lobulated renal parenchyma with associated scarring and suggestion of innumerable cystic lesions. Suggestion of ill-defined underlying renal lesions of which measure simple fluid (7:164, 161). Subcentimeter hypodensities are too small to characterize. No hydronephrosis. No hydroureter. The urinary bladder is unremarkable. Stomach/Bowel: Stomach is within normal limits. No evidence of bowel wall thickening or  dilatation. Scattered sigmoid diverticulosis. Appendix appears normal. Lymphatic: No abdominal, pelvic, or inguinal lymphadenopathy. Reproductive: Prostate is unremarkable. Other: No intraperitoneal free fluid. No intraperitoneal free gas. No organized fluid collection. Musculoskeletal: Small fat containing left inguinal hernia. Possible tiny fat containing right inguinal hernia. No suspicious lytic or blastic osseous lesions. No acute displaced fracture. Old healed left rib fractures. Old healed left rib fractures. Multilevel degenerative changes of the spine with intervertebral disc space narrowing and vacuum phenomenon at the L4-L5 and L5-S1 levels. Trace retrolisthesis of L5 on S1. Review of the MIP images confirms the above findings. IMPRESSION: VASCULAR: 1. No aortic aneurysm or dissection. Aortic Atherosclerosis (ICD10-I70.0). 2. 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 NON-VASCULAR: 1. Vague peribronchovascular ground-glass airspace opacities, some of which are nodular-like, within the right lower lobe and left upper lobes. Findings could represent infection/inflammation with COVID 19 infection not excluded. Non-contrast chest CT at 3-6 months is recommended. If the nodules are stable at time of repeat CT, then future CT at 18-24 months (from today's scan) is considered optional for low-risk patients, but is recommended for high-risk patients. This recommendation follows the consensus statement: Guidelines for Management of Incidental Pulmonary Nodules Detected on CT Images: From the Fleischner Society 2017; Radiology 2017; 284:228-243. 2. Interval development of multiple renal cystic lesions that are poorly visualized/evaluated. Recommend dedicated cross-sectional imaging renal protocol for further evaluation. 3. Scattered sigmoid diverticulosis with no acute diverticulitis. 4. Bilateral fat containing inguinal hernias.  Electronically Signed   By: Iven Finn M.D.   On: 10/16/2020 21:10   Assessment / Plan: *69 year old male with a history of CAD with coronary stenting in February, on  Brilinta, history of end-stage renal disease on HD, missing last 3 dialysis sessions, recent alcohol use, complaining of chest pain, abdominal pain and melena for 2 days.  He has a history of gastric and duodenal AVMs.  Received one unit PRBCs and this AM Hgb stable at 8.7 grams.  Does not appear that he is having significantly active bleeding right now.  Needs an upper endoscopy to clear his upper tract, suspect he likely has recurrent AVM bleeding in the setting of Brilinta.  We were/are awaiting cardiac clearance.  He is supposed to get an ECHO today.  - IV protonix 40mg  BID - clear liquid diet okay for now, NPO after MN again for EGD on Monday. - Planning tentatively for EGD on 12/27 pending ECHO results.  Orders have been placed. - trend Hgb and transfuse further if needed.   LOS: 1 day   Laban Emperor. Breann Losano  10/18/2020, 11:54 AM

## 2020-10-19 ENCOUNTER — Encounter (HOSPITAL_COMMUNITY)
Admission: EM | Disposition: A | Discharge status: Home / Self Care | Payer: Self-pay | Source: Home / Self Care | Attending: Internal Medicine

## 2020-10-19 ENCOUNTER — Inpatient Hospital Stay (HOSPITAL_COMMUNITY): Payer: Medicare Other | Admitting: Anesthesiology

## 2020-10-19 ENCOUNTER — Other Ambulatory Visit: Payer: Self-pay | Admitting: Internal Medicine

## 2020-10-19 ENCOUNTER — Telehealth: Payer: Self-pay | Admitting: Cardiovascular Disease

## 2020-10-19 DIAGNOSIS — R778 Other specified abnormalities of plasma proteins: Secondary | ICD-10-CM

## 2020-10-19 DIAGNOSIS — K31819 Angiodysplasia of stomach and duodenum without bleeding: Secondary | ICD-10-CM

## 2020-10-19 DIAGNOSIS — I200 Unstable angina: Secondary | ICD-10-CM

## 2020-10-19 HISTORY — PX: HOT HEMOSTASIS: SHX5433

## 2020-10-19 HISTORY — PX: ESOPHAGOGASTRODUODENOSCOPY (EGD) WITH PROPOFOL: SHX5813

## 2020-10-19 LAB — BASIC METABOLIC PANEL
Anion gap: 11 (ref 5–15)
BUN: 85 mg/dL — ABNORMAL HIGH (ref 8–23)
CO2: 25 mmol/L (ref 22–32)
Calcium: 8 mg/dL — ABNORMAL LOW (ref 8.9–10.3)
Chloride: 95 mmol/L — ABNORMAL LOW (ref 98–111)
Creatinine, Ser: 7.34 mg/dL — ABNORMAL HIGH (ref 0.61–1.24)
GFR, Estimated: 7 mL/min — ABNORMAL LOW (ref >60)
Glucose, Bld: 89 mg/dL (ref 70–99)
Potassium: 4.5 mmol/L (ref 3.5–5.1)
Sodium: 131 mmol/L — ABNORMAL LOW (ref 135–145)

## 2020-10-19 LAB — CBC
HCT: 24.8 % — ABNORMAL LOW (ref 39.0–52.0)
Hemoglobin: 8.5 g/dL — ABNORMAL LOW (ref 13.0–17.0)
MCH: 32.9 pg (ref 26.0–34.0)
MCHC: 34.3 g/dL (ref 30.0–36.0)
MCV: 96.1 fL (ref 80.0–100.0)
Platelets: 147 10*3/uL — ABNORMAL LOW (ref 150–400)
RBC: 2.58 MIL/uL — ABNORMAL LOW (ref 4.22–5.81)
RDW: 16.3 % — ABNORMAL HIGH (ref 11.5–15.5)
WBC: 8 10*3/uL (ref 4.0–10.5)
nRBC: 0 % (ref 0.0–0.2)

## 2020-10-19 LAB — GLUCOSE, CAPILLARY
Glucose-Capillary: 100 mg/dL — ABNORMAL HIGH (ref 70–99)
Glucose-Capillary: 106 mg/dL — ABNORMAL HIGH (ref 70–99)
Glucose-Capillary: 109 mg/dL — ABNORMAL HIGH (ref 70–99)
Glucose-Capillary: 234 mg/dL — ABNORMAL HIGH (ref 70–99)

## 2020-10-19 LAB — MAGNESIUM: Magnesium: 2.1 mg/dL (ref 1.7–2.4)

## 2020-10-19 SURGERY — ESOPHAGOGASTRODUODENOSCOPY (EGD) WITH PROPOFOL
Anesthesia: Monitor Anesthesia Care

## 2020-10-19 MED ORDER — LIDOCAINE 2% (20 MG/ML) 5 ML SYRINGE
INTRAMUSCULAR | Status: DC | PRN
  Administered 2020-10-19: 40 mg via INTRAVENOUS

## 2020-10-19 MED ORDER — PROPOFOL 10 MG/ML IV BOLUS
INTRAVENOUS | Status: DC | PRN
  Administered 2020-10-19: 10 mg via INTRAVENOUS
  Administered 2020-10-19: 40 mg via INTRAVENOUS
  Administered 2020-10-19: 10 mg via INTRAVENOUS
  Administered 2020-10-19: 30 mg via INTRAVENOUS
  Administered 2020-10-19: 10 mg via INTRAVENOUS

## 2020-10-19 MED ORDER — DOXYCYCLINE HYCLATE 100 MG PO TABS: 100.0000 mg | ORAL_TABLET | Freq: Two times a day (BID) | ORAL | Status: DC

## 2020-10-19 MED ORDER — PANTOPRAZOLE SODIUM 40 MG PO TBEC: 40.0000 mg | DELAYED_RELEASE_TABLET | Freq: Two times a day (BID) | ORAL | 0 refills | Status: DC

## 2020-10-19 MED ORDER — SODIUM CHLORIDE 0.9 % IV SOLN: INTRAVENOUS | Status: DC

## 2020-10-19 MED ORDER — SODIUM CHLORIDE 0.9 % IV SOLN: INTRAVENOUS | Status: DC | PRN

## 2020-10-19 MED ORDER — PROPOFOL 500 MG/50ML IV EMUL
INTRAVENOUS | Status: DC | PRN
  Administered 2020-10-19: 100 ug/kg/min via INTRAVENOUS

## 2020-10-19 MED ORDER — ASPIRIN 81 MG PO CHEW: 81.0000 mg | CHEWABLE_TABLET | Freq: Every day | ORAL | 0 refills | Status: DC

## 2020-10-19 MED ORDER — DOXYCYCLINE HYCLATE 100 MG PO TABS: 100.0000 mg | ORAL_TABLET | Freq: Two times a day (BID) | ORAL | 0 refills | Status: AC

## 2020-10-19 SURGICAL SUPPLY — 15 items

## 2020-10-19 NOTE — Op Note (Signed)
[facility] Patient Name: Jacob [name] Procedure Date : 10/19/2020 MRN: [id number] Attending MD: Jackquline Denmark , MD Date of Birth: 1951-10-20 CSN: [id number] Age: 69 Admit Type: Inpatient Procedure:                Upper GI endoscopy Indications:              Melena in pt with known gastric AVMs on brilinta.                            Pt with ESRD on HD with recurrent anemia. Providers:                Jackquline Denmark, MD, Particia Nearing, RN, Elspeth Cho                            Tech., Technician, Clearnce Sorrel, CRNA Referring MD:              Medicines:                Monitored Anesthesia Care Complications:            No immediate complications. Estimated Blood Loss:     Estimated blood loss: none. Procedure:                Pre-Anesthesia Assessment:                           - Prior to the procedure, a History and Physical                            was performed, and patient medications and                            allergies were reviewed. The patient's tolerance of                            previous anesthesia was also reviewed. The risks                            and benefits of the procedure and the sedation                            options and risks were discussed with the patient.                            All questions were answered, and informed consent                            was obtained. Prior Anticoagulants: The patient has                            taken Brilinta, last dose was 3 days prior to                            procedure. ASA Grade Assessment: IV - A patient  with severe systemic disease that is a constant                            threat to life. After reviewing the risks and                            benefits, the patient was deemed in satisfactory                            condition to undergo the procedure.                           After obtaining informed consent, the endoscope was                             passed under direct vision. Throughout the                            procedure, the patient's blood pressure, pulse, and                            oxygen saturations were monitored continuously. The                            [id number]-H190 ([id number]) Olympus gastroscope was                            introduced through the mouth, and advanced to the                            second part of duodenum. The upper GI endoscopy was                            accomplished without difficulty. The patient                            tolerated the procedure well. Scope In: Scope Out: Findings:      The examined esophagus was normal with well-defined Z-line at 35 cm. No       varices.      Five 2 to 4 mm angiodysplastic lesions with no bleeding were found in       the gastric fundus and in the gastric body. Coagulation for hemostasis       using argon plasma at 1 liter/minute and 20 watts was successful.       Estimated blood loss: none.      The examined duodenum was normal. Impression:               - Gastric AVMs s/p APC treatment.                           - No specimens collected. Recommendation:           - Return patient to hospital ward for ongoing care.                           -  Full liquid diet today. Advance to renal diet by                            dinner tonight.                           - Use Protonix (pantoprazole) 40 mg IV BID x 48                            hours, then transition to 40 mg p.o. once a day                            indefinitely.                           - Resume Brilinta at prior dose tomorrow.                           - Recheck CBC in a.m. and then CBC every 2 weeks at                            time of hemodialysis and transfuse as needed.                           - Repeat EGD on as needed basis, if recurrent                            melena requiring transfusion.                           - Will sign off for now. Please call us if with any                             ?/Concerns.                           - FU in GI clinic on as-needed basis. Procedure Code(s):        --- Professional ---                           [phone], Esophagogastroduodenoscopy, flexible,                            transoral; with control of bleeding, any method Diagnosis Code(s):        --- Professional ---                           N02.725, Angiodysplasia of stomach and duodenum                            without bleeding                           K92.1, Melena (includes Hematochezia) CPT copyright 2019 American [employer] Association. All rights reserved. The codes documented  in this report are preliminary and upon coder review may  be revised to meet current compliance requirements. Jackquline Denmark, MD 10/19/2020 9:21:16 AM This report has been signed electronically. Number of Addenda: 0

## 2020-10-19 NOTE — Interval H&P Note (Signed)
History and Physical Interval Note:  10/19/2020 8:31 AM  [name]  has presented today for surgery, with the diagnosis of Anemia, melena, history of AVMs.  The various methods of treatment have been discussed with the patient and family. After consideration of risks, benefits and other options for treatment, the patient has consented to  Procedure(s): ESOPHAGOGASTRODUODENOSCOPY (EGD) WITH PROPOFOL (N/A) as a surgical intervention.  The patient's history has been reviewed, patient examined, no change in status, stable for surgery.  I have reviewed the patient's chart and labs.  Questions were answered to the patient's satisfaction.     Jacob [name]

## 2020-10-19 NOTE — Progress Notes (Signed)
Progress Note  Patient Name: Jacob [name] Date of Encounter: 10/19/2020  [facility] Cardiologist: Jenkins Rouge, MD   Subjective   No CP or dyspnea  Inpatient Medications    Scheduled Meds: . sodium chloride   Intravenous Once  . allopurinol  100 mg Oral Daily  . aspirin  81 mg Oral Daily  . atorvastatin  40 mg Oral Daily  . bictegravir-emtricitabine-tenofovir AF  1 tablet Oral Daily  . calcitRIOL  3 mcg Oral Q M,W,F-HD  . carvedilol  3.125 mg Oral BID WC  . Chlorhexidine Gluconate Cloth  6 each Topical Q0600  . cinacalcet  90 mg Oral Q M,W,F-1800  . [START ON 10/23/2020] darbepoetin (ARANESP) injection - DIALYSIS  100 mcg Intravenous Q Fri-HD  . folic acid  1 mg Oral Daily  . gabapentin  300 mg Oral BID  . insulin aspart  0-6 Units Subcutaneous Q4H  . isosorbide mononitrate  30 mg Oral Daily  . latanoprost  1 drop Both Eyes QHS  . multivitamin  1 tablet Oral QHS  . pantoprazole (PROTONIX) IV  40 mg Intravenous Q12H  . sevelamer carbonate  2,400 mg Oral TID WC  . thiamine  100 mg Oral Daily   Or  . thiamine  100 mg Intravenous Daily  . timolol  1 drop Both Eyes Daily  . umeclidinium-vilanterol  1 puff Inhalation Daily   Continuous Infusions: . doxycycline (VIBRAMYCIN) IV 100 mg (10/19/20 0549)   PRN Meds: diclofenac Sodium, LORazepam **OR** LORazepam, nitroGLYCERIN, traMADol   Vital Signs    Vitals:   10/19/20 0911 10/19/20 0921 10/19/20 0930 10/19/20 1103  BP: (!) 124/52 (!) 120/49 (!) 134/50 (!) 156/75  Pulse: 91 85 85 90  Resp: 19 16 12 16   Temp: 97.7 F (36.5 C)   98.4 F (36.9 C)  TempSrc: Temporal   Oral  SpO2: 100% 100% 99% 100%  Weight:      Height:        Intake/Output Summary (Last 24 hours) at 10/19/2020 1124 Last data filed at 10/19/2020 0916 Gross per 24 hour  Intake 1120 ml  Output 0 ml  Net 1120 ml   Last 3 Weights 10/19/2020 10/19/2020 10/18/2020  Weight (lbs) 172 lb 9.9 oz 172 lb 9.9 oz 149 lb 6.4 oz  Weight (kg) 78.3 kg  78.3 kg 67.767 kg      Telemetry    Sinus with 5 beats NSVT- Personally Reviewed  Physical Exam   GEN: No acute distress.   Neck: No JVD Cardiac: RRR, no murmurs, rubs, or gallops.  Respiratory: Clear to auscultation bilaterally. GI: Soft, nontender, non-distended  MS: No edema Neuro:  Nonfocal  Psych: Normal affect   Labs    High Sensitivity Troponin:   Recent Labs  Lab 10/16/20 1856 10/16/20 2056 10/17/20 0139 10/17/20 0318  TROPONINIHS 68* 73* 222* 437*      Chemistry Recent Labs  Lab 10/16/20 1807 10/17/20 0139 10/17/20 1401 10/18/20 0231 10/19/20 0128  NA 133*  --  128* 135 131*  K 6.2*  --  4.2 4.2 4.5  CL 95*  --  93* 97* 95*  CO2 13*  --  21* 27 25  GLUCOSE 197*  --  296* 104* 89  BUN 203*  --  154* 81* 85*  CREATININE 8.92*   < > 6.76* 6.01* 7.34*  CALCIUM 8.0*  --  7.6* 8.5* 8.0*  PROT 7.0  --   --   --   --   ALBUMIN  3.3*  --  2.8*  --   --   AST 32  --   --   --   --   ALT 20  --   --   --   --   ALKPHOS 80  --   --   --   --   BILITOT 0.4  --   --   --   --   GFRNONAA 6*   < > 8* 9* 7*  ANIONGAP 25*  --  14 11 11    < > = values in this interval not displayed.     Hematology Recent Labs  Lab 10/17/20 1139 10/18/20 0231 10/19/20 0128  WBC 7.5 7.6 8.0  RBC 2.23* 2.65* 2.58*  HGB 7.0* 8.7* 8.5*  HCT 21.3* 25.0* 24.8*  MCV 95.5 94.3 96.1  MCH 31.4 32.8 32.9  MCHC 32.9 34.8 34.3  RDW 16.3* 17.2* 16.3*  PLT 166 156 147*    Radiology    ECHOCARDIOGRAM COMPLETE  Result Date: 10/18/2020    ECHOCARDIOGRAM REPORT   Patient Name:   JALIEN WEAKLAND Date of Exam: 10/18/2020 Medical Rec #:  [id number]       Height:       74.0 in Accession #:    [id number]      Weight:       149.4 lb Date of Birth:  1950/11/29        BSA:          1.920 m Patient Age:    36 years        BP:           151/82 mmHg Patient Gender: M               HR:           81 bpm. Exam Location:  Inpatient Procedure: 2D Echo, Cardiac Doppler, Color Doppler and 3D Echo  Indications:    Chest pain  History:        Patient has prior history of Echocardiogram examinations, most                 recent 02/07/2019. Cardiomyopathy, Previous Myocardial Infarction                 and CAD, Prior CABG; Risk Factors:Hypertension, Dyslipidemia,                 Diabetes and Current Smoker. CKD. GERD. ETOH abuse.  Sonographer:    Clayton Lefort RDCS (AE) Referring Phys: [place]  1. Left ventricular ejection fraction, by estimation, is 40 to 45%. The left ventricle has mildly decreased function. The left ventricle demonstrates regional wall motion abnormalities (see scoring diagram/findings for description). The left ventricular  internal cavity size was moderately dilated. There is moderate concentric left ventricular hypertrophy. Left ventricular diastolic parameters are consistent with Grade II diastolic dysfunction (pseudonormalization). Elevated left atrial pressure.  2. Right ventricular systolic function is normal. The right ventricular size is normal. There is normal pulmonary artery systolic pressure.  3. Left atrial size was moderately dilated.  4. The mitral valve is normal in structure. Mild mitral valve regurgitation. No evidence of mitral stenosis.  5. The aortic valve is normal in structure. Aortic valve regurgitation is not visualized. No aortic stenosis is present.  6. The inferior vena cava is dilated in size with >50% respiratory variability, suggesting right atrial pressure of 8 mmHg. FINDINGS  Left Ventricle: Left ventricular ejection fraction, by estimation, is  40 to 45%. The left ventricle has mildly decreased function. The left ventricle demonstrates regional wall motion abnormalities. The left ventricular internal cavity size was moderately dilated. There is moderate concentric left ventricular hypertrophy. Left ventricular diastolic parameters are consistent with Grade II diastolic dysfunction (pseudonormalization). Elevated left atrial  pressure.  LV Wall Scoring: The mid and distal lateral wall, entire inferior wall, and posterior wall are hypokinetic. Right Ventricle: The right ventricular size is normal. No increase in right ventricular wall thickness. Right ventricular systolic function is normal. There is normal pulmonary artery systolic pressure. Left Atrium: Left atrial size was moderately dilated. Right Atrium: Right atrial size was normal in size. Pericardium: There is no evidence of pericardial effusion. Mitral Valve: The mitral valve is normal in structure. Mild mitral valve regurgitation. No evidence of mitral valve stenosis. Tricuspid Valve: The tricuspid valve is normal in structure. Tricuspid valve regurgitation is not demonstrated. No evidence of tricuspid stenosis. Aortic Valve: The aortic valve is normal in structure. Aortic valve regurgitation is not visualized. No aortic stenosis is present. Aortic valve mean gradient measures 2.0 mmHg. Aortic valve peak gradient measures 4.1 mmHg. Aortic valve area, by VTI measures 3.79 cm. Pulmonic Valve: The pulmonic valve was normal in structure. Pulmonic valve regurgitation is not visualized. No evidence of pulmonic stenosis. Aorta: The aortic root is normal in size and structure. Venous: The inferior vena cava is dilated in size with greater than 50% respiratory variability, suggesting right atrial pressure of 8 mmHg. IAS/Shunts: No atrial level shunt detected by color flow Doppler.  LEFT VENTRICLE PLAX 2D LVIDd:         6.10 cm  Diastology LVIDs:         5.20 cm  LV e' medial:  5.87 cm/s LV PW:         1.60 cm  LV e' lateral: 5.87 cm/s LV IVS:        1.30 cm LVOT diam:     2.10 cm LV SV:         69 LV SV Index:   36 LVOT Area:     3.46 cm 3D Volume EF:                         3D EF:        30 %                         LV EDV:       314 ml                         LV ESV:       220 ml                         LV SV:        94 ml RIGHT VENTRICLE             IVC RV Basal diam:  3.10 cm      IVC diam: 2.60 cm RV S prime:     10.90 cm/s TAPSE (M-mode): 1.4 cm LEFT ATRIUM             Index       RIGHT ATRIUM           Index LA diam:        4.70 cm 2.45 cm/m  RA Area:  17.10 cm LA Vol (A2C):   65.5 ml 34.11 ml/m RA Volume:   41.90 ml  21.82 ml/m LA Vol (A4C):   68.4 ml 35.62 ml/m LA Biplane Vol: 68.0 ml 35.42 ml/m  AORTIC VALVE AV Area (Vmax):    3.53 cm AV Area (Vmean):   3.16 cm AV Area (VTI):     3.79 cm AV Vmax:           101.00 cm/s AV Vmean:          69.700 cm/s AV VTI:            0.183 m AV Peak Grad:      4.1 mmHg AV Mean Grad:      2.0 mmHg LVOT Vmax:         103.00 cm/s LVOT Vmean:        63.500 cm/s LVOT VTI:          0.200 m LVOT/AV VTI ratio: 1.09  AORTA Ao Root diam: 3.50 cm Ao Asc diam:  3.20 cm  SHUNTS Systemic VTI:  0.20 m Systemic Diam: 2.10 cm Ena Dawley MD Electronically signed by Ena Dawley MD Signature Date/Time: 10/18/2020/3:18:18 PM    Final     Patient Profile     69 y.o. male with past medical history of coronary artery disease status post coronary artery bypass and graft, ischemic cardiomyopathy, end-stage renal disease dialysis dependent, HIV, hypertension, hyperlipidemia, diabetes mellitus, history of GI bleed, alcohol abuse admitted in the setting of missing dialysis associated with hyperkalemia and worsening anemia for evaluation of elevated troponin.  Echocardiogram showed ejection fraction 40 to 45%, moderate left ventricular enlargement, moderate left ventricular hypertrophy, grade 2 diastolic dysfunction, moderate left atrial enlargement, mild mitral regurgitation.  Assessment & Plan    1 elevated troponin-patient denies chest pain.  Echocardiogram shows LV function that is unchanged.  Would not pursue further ischemia evaluation at this point.  Note his elevation occurred in the setting of missing dialysis with worsening renal function and in the setting of anemia.  2 coronary artery disease status post coronary artery bypass  graft-continue aspirin and statin at discharge.  Would stay off of Brilinta given worsening anemia and patient's last stent was in February.  3 end-stage renal disease-Per nephrology.  4 anemia-status post EGD showing gastric AVMs.  Further management per primary service.  Cardiology will sign off.  Please call with questions.  Continue present cardiac medications at discharge.  Follow-up Dr. [name] 6 to 12 weeks.  For questions or updates, please contact [place] Please consult [email or web address] for contact info under        Signed, Kirk Ruths, MD  10/19/2020, 11:24 AM

## 2020-10-19 NOTE — Telephone Encounter (Signed)
Patient was recently seen in ED for CP, hyperkalemia & uremia. He was discharged today and he states his instructions say to stop Brilinta and start 81 mg Aspirin.   Cardiology was consulted during stay, seen by Dr. Stanford Breed who advised in hospital note to continue all cardiac meds at discharge and f/u with Dr. [name] in 6-12 weeks.   Advised patient to continue taking his Brilinta and NOT to start taking any aspirin at this time.   Routing to Dr. [name] and his RN for follow-up.

## 2020-10-19 NOTE — Discharge Summary (Signed)
Physician Discharge Summary  Jacob [name] JQB:[id number] DOB: 04-May-1951 DOA: 10/16/2020  PCP: [name], NP  Admit date: 10/16/2020 Discharge date: 10/19/2020  Admitted From: Home  disposition: Home  Recommendations for Outpatient Follow-up:  Follow-up with PCP in 1 week Repeat CBC on next hemodialysis session Continue Protonix 40 mg p.o. twice daily Start aspirin 81 mg daily.  Discontinue Brilinta Follow-up with cardiology as a scheduled Avoid use of alcohol Take doxycycline 100 mg p.o. twice daily for 3 more days for possible pneumonia   Home Health: None Equipment/Devices: None Discharge Condition: Stable CODE STATUS: Full code Diet recommendation: Resume renal diet tonight  Brief/Interim Summary: Jacob [name] a 69 y.o.malewithhistory of CAD status post stenting last 1 was in February 2021 presently on Brilinta which patient has not taken for last couple of days with history of ESRD on hemodialysis missed last 3 sessions patient is also depressed and has been drinking a lot of alcohol last 1 week presents to the ER with complaints of having missed dialysis. In addition patient also states he has been having some left-sided chest pain and abdominal pain mostly in the epigastric area.   ED Course:In the ER patient was tachycardic with EKG showing ST depression and T wave inversions in the lateral leads. Initial troponin was 68 and second on a 73. Hemoglobin is around 7.5 potassium 6.2 bicarb 13 given the patient's symptoms of chest pain and abdominal pain CT angiogram of the chest abdomen pelvis was done which shows nonspecific peribronchial groundglass opacities in the right lower lobe and left upper lobe. There is also severe narrowing of the superior mesenteric artery. On-call nephrologist Dr. Graylon Gunning was consulted. At this time Dr. Posey Pronto has arranged for dialysis and also given D50 and IV insulin and calcium gluconate. At the time of my exam patient is  chest pain-free. Covid test is negative. Patient remains tachycardic.  His hemoglobin noted to be 6.5-1 unit PRBC ordered.  Patient has history of AVMs.  GI consulted.  NSTEMI/demand ischemia: -Patient has history of coronary artery disease status post two-vessel bypass in 2004 followed by recent PCI and drug-eluting stent in February 2021. -Patient presented with chest pain and elevated troponin.  Troponin trended up from [id number].  EKG shows ST-T wave changes in lateral leads-likely demand ischemia in the setting of acute blood loss anemia -Cardiology consulted-appreciate help. -Echo showed ejection fraction of 40 to 45% with grade 2 diastolic dysfunction and moderate LVH. -Continued aspirin, beta-blocker, statin, Imdur.  Hold Brilinta  in the setting of acute GI bleed. -Cardiology recommended that elevated troponin is likely due to demand ischemia due to missed dialysis and anemia.  No further evaluation at this time.  Cardiology signed off.  Recommended to follow-up with Dr. [name] in 6 to 12 weeks.  Discussed with Dr. Stanford Breed via secure chat-recommended to discontinue Brilinta and start aspirin at the time of discharge.  Acute blood loss anemia: -In the setting of upper GI bleed. -Patient reports melena since 2 days.  H&H dropped to 6.5/19.9 -Status post 2 unit PRBC transfusion.  Hemoglobin 8.7-->8.5. -EGD in February 2021 revealing angiodysplastic lesions with bleeding in the gastric fundus and body. -Started on PPI IV twice daily -Underwent EGD on 12/27 which showed AVMs in gastric fundus and body status post APC treatment. -Patient wishes to go home today.  Discussed with GI-okay to discharge from GI standpoint on PPI p.o. twice daily.  Repeat CBC on next dialysis session.  ESRD on hemodialysis: Patient has been  noncompliant with his dialysis last week. -Patient hyperkalemic and hypertensive upon arrival. -Nephrology consulted for dialysis-appreciate help -10/27: Patient is  due for dialysis however he refused HD today and tomorrow.  He tells me that he will go for dialysis on Wednesday.  Hyperkalemia: In the setting of ESRD and missed hemodialysis -He received insulin, D50 and calcium gluconate in the ED.   -Resolved  CAP?: -CTA chest concerning for vague peribronchovascular groundglass airspace opacities which are nodular like within the right lower lobe and left upper lobe-findings could represent infection/inflammation.  PCT: 1.04, LDH: WNL - started on doxycycline considering his immunocompromised status in the setting of HIV -We will discharge patient on doxycycline 100 mg p.o. twice daily for 3 more days to finish total 5 days of antibiotics.  History of alcohol abuse: -Continued multivitamin, folic acid and thiamine. -On CIWA protocol. -Monitored electrolytes and monitor for DTs.  Hypertension:  Blood pressure improved after hemodialysis. Continued Coreg and Imdur.    Diabetes mellitus: Last A1c 4.4% -Started on sliding scale insulin.  Resume home regimen of insulin at the time of discharge.    Diabetes neuropathy: Continued gabapentin  Hyperlipidemia: Continued statin  HIV: Continued Biktarvy  Discharge Diagnoses:  NSTEMI/demand ischemia Acute blood loss anemia in the setting of upper GI bleed ESRD on hemodialysis Hyperkalemia CAP History of alcohol abuse Hypertension Diabetes mellitus Diabetic neuropathy Hyperlipidemia HIV Hyponatremia Thrombocytopenia  Discharge Instructions  Discharge Instructions    Discharge instructions   Complete by: As directed    Follow-up with PCP in 1 week Repeat CBC on next hemodialysis session Continue Protonix 40 mg p.o. twice daily Start aspirin 81 mg daily.  Discontinue Brilinta Follow-up with cardiology as a scheduled Avoid use of alcohol Take doxycycline 100 mg p.o. twice daily for 3 more days for possible pneumonia   Increase activity slowly   Complete by: As directed       Allergies as of 10/19/2020      Reactions   Augmentin [amoxicillin-pot Clavulanate] Diarrhea   Severe   Amphetamines Other (See Comments)   Unknown reaction type      Medication List    STOP taking these medications   ticagrelor 90 MG Tabs tablet Commonly known as: BRILINTA     TAKE these medications   acetaminophen 650 MG CR tablet Commonly known as: TYLENOL Take 650 mg by mouth every 8 (eight) hours as needed for pain.   allopurinol 100 MG tablet Commonly known as: ZYLOPRIM TAKE 1 TABLET(100 MG) BY MOUTH DAILY What changed:   how much to take  how to take this  when to take this   Anoro Ellipta 62.5-25 MCG/INH Aepb Generic drug: umeclidinium-vilanterol INHALE 1 PUFF BY MOUTH EVERY DAY What changed: See the new instructions.   aspirin 81 MG chewable tablet Chew 1 tablet (81 mg total) by mouth daily.   B-D UF III MINI PEN NEEDLES 31G X 5 MM Misc Generic drug: Insulin Pen Needle USE FOUR TIMES DAILY   Biktarvy 50-200-25 MG Tabs tablet Generic drug: bictegravir-emtricitabine-tenofovir AF Take 1 tablet by mouth daily.   carvedilol 3.125 MG tablet Commonly known as: COREG Take 1 tablet (3.125 mg total) by mouth 2 (two) times daily.   colchicine 0.6 MG tablet Take 0.6 mg by mouth daily as needed (pain).   diclofenac Sodium 1 % Gel Commonly known as: VOLTAREN APPLY 2 GRAMS TOPICALLY TO THE AFFECTED AREA THREE TIMES DAILY AS NEEDED FOR PAIN What changed: See the new instructions.   doxycycline 100 MG  tablet Commonly known as: VIBRA-TABS Take 1 tablet (100 mg total) by mouth every 12 (twelve) hours for 3 days.   gabapentin 300 MG capsule Commonly known as: NEURONTIN Take 1 capsule (300 mg total) by mouth 2 (two) times daily.   insulin lispro 100 UNIT/ML KwikPen Commonly known as: HumaLOG KwikPen Inject 0.51ml (20 Units total) into the skin 3 (three) times daily as needed for high blood sugar (above 150)   iron sucrose in sodium chloride 0.9 % 100  mL Iron Sucrose (Venofer)   isosorbide mononitrate 30 MG 24 hr tablet Commonly known as: IMDUR TAKE 1 TABLET BY MOUTH EVERY DAY What changed:   how much to take  how to take this  when to take this   latanoprost 0.005 % ophthalmic solution Commonly known as: XALATAN Place 1 drop into both eyes at bedtime.   MIRCERA IJ Mircera   multivitamin Tabs tablet Take 1 tablet by mouth daily.   nitroGLYCERIN 0.3 MG SL tablet Commonly known as: NITROSTAT ONE TABLET UNDER TONGUE AS NEEDED FOR CHEST PAIN What changed: See the new instructions.   OneTouch Delica Plus [id number] Misc Inject 1 Device as directed 3 (three) times daily. Dx: E11.40   OneTouch Ultra test strip Generic drug: glucose blood CHECK BLOOD SUGAR 3 TIMES  DAILY   pantoprazole 40 MG tablet Commonly known as: PROTONIX Take 1 tablet (40 mg total) by mouth 2 (two) times daily. What changed: See the new instructions.   SENSIPAR PO Take by mouth.   sevelamer carbonate 800 MG tablet Commonly known as: RENVELA Take 800 mg by mouth 3 (three) times daily with meals.   timolol 0.5 % ophthalmic solution Commonly known as: TIMOPTIC Place 1 drop into both eyes every morning.   traMADol 50 MG tablet Commonly known as: ULTRAM Take 1 tablet (50 mg total) by mouth every 12 (twelve) hours as needed.   VITAMIN D BOOSTER PO Take by mouth.       Allergies  Allergen Reactions  . Augmentin [Amoxicillin-Pot Clavulanate] Diarrhea    Severe  . Amphetamines Other (See Comments)    Unknown reaction type    Consultations:  GI  Nephrology  Cardiology   Procedures/Studies: DG Chest Port 1 View  Result Date: 10/16/2020 CLINICAL DATA:  Shortness of breath. EXAM: PORTABLE CHEST 1 VIEW COMPARISON:  Chest x-ray 03/11/2020 FINDINGS: Slightly more prominent cardiac silhouette likely due to AP portable technique. Otherwise the heart size and mediastinal contours are unchanged. Aortic arch calcifications. Marked  calcifications of the major vessels originating from the aortic arch. Coronary artery stent. No focal consolidation. No pulmonary edema. No pleural effusion. No pneumothorax. No acute osseous abnormality. Old left rib fractures. Old right rib fractures. IMPRESSION: No active disease. Electronically Signed   By: Iven Finn M.D.   On: 10/16/2020 19:16   ECHOCARDIOGRAM COMPLETE  Result Date: 10/18/2020    ECHOCARDIOGRAM REPORT   Patient Name:   Jacob [name] Date of Exam: 10/18/2020 Medical Rec #:  [id number]       Height:       74.0 in Accession #:    [id number]      Weight:       149.4 lb Date of Birth:  1951-01-26        BSA:          1.920 m Patient Age:    1 years        BP:           151/82 mmHg Patient  Gender: M               HR:           81 bpm. Exam Location:  Inpatient Procedure: 2D Echo, Cardiac Doppler, Color Doppler and 3D Echo Indications:    Chest pain  History:        Patient has prior history of Echocardiogram examinations, most                 recent 02/07/2019. Cardiomyopathy, Previous Myocardial Infarction                 and CAD, Prior CABG; Risk Factors:Hypertension, Dyslipidemia,                 Diabetes and Current Smoker. CKD. GERD. ETOH abuse.  Sonographer:    Clayton Lefort RDCS (AE) Referring Phys: [place]  1. Left ventricular ejection fraction, by estimation, is 40 to 45%. The left ventricle has mildly decreased function. The left ventricle demonstrates regional wall motion abnormalities (see scoring diagram/findings for description). The left ventricular  internal cavity size was moderately dilated. There is moderate concentric left ventricular hypertrophy. Left ventricular diastolic parameters are consistent with Grade II diastolic dysfunction (pseudonormalization). Elevated left atrial pressure.  2. Right ventricular systolic function is normal. The right ventricular size is normal. There is normal pulmonary artery systolic pressure.  3. Left  atrial size was moderately dilated.  4. The mitral valve is normal in structure. Mild mitral valve regurgitation. No evidence of mitral stenosis.  5. The aortic valve is normal in structure. Aortic valve regurgitation is not visualized. No aortic stenosis is present.  6. The inferior vena cava is dilated in size with >50% respiratory variability, suggesting right atrial pressure of 8 mmHg. FINDINGS  Left Ventricle: Left ventricular ejection fraction, by estimation, is 40 to 45%. The left ventricle has mildly decreased function. The left ventricle demonstrates regional wall motion abnormalities. The left ventricular internal cavity size was moderately dilated. There is moderate concentric left ventricular hypertrophy. Left ventricular diastolic parameters are consistent with Grade II diastolic dysfunction (pseudonormalization). Elevated left atrial pressure.  LV Wall Scoring: The mid and distal lateral wall, entire inferior wall, and posterior wall are hypokinetic. Right Ventricle: The right ventricular size is normal. No increase in right ventricular wall thickness. Right ventricular systolic function is normal. There is normal pulmonary artery systolic pressure. Left Atrium: Left atrial size was moderately dilated. Right Atrium: Right atrial size was normal in size. Pericardium: There is no evidence of pericardial effusion. Mitral Valve: The mitral valve is normal in structure. Mild mitral valve regurgitation. No evidence of mitral valve stenosis. Tricuspid Valve: The tricuspid valve is normal in structure. Tricuspid valve regurgitation is not demonstrated. No evidence of tricuspid stenosis. Aortic Valve: The aortic valve is normal in structure. Aortic valve regurgitation is not visualized. No aortic stenosis is present. Aortic valve mean gradient measures 2.0 mmHg. Aortic valve peak gradient measures 4.1 mmHg. Aortic valve area, by VTI measures 3.79 cm. Pulmonic Valve: The pulmonic valve was normal in structure.  Pulmonic valve regurgitation is not visualized. No evidence of pulmonic stenosis. Aorta: The aortic root is normal in size and structure. Venous: The inferior vena cava is dilated in size with greater than 50% respiratory variability, suggesting right atrial pressure of 8 mmHg. IAS/Shunts: No atrial level shunt detected by color flow Doppler.  LEFT VENTRICLE PLAX 2D LVIDd:         6.10 cm  Diastology LVIDs:  5.20 cm  LV e' medial:  5.87 cm/s LV PW:         1.60 cm  LV e' lateral: 5.87 cm/s LV IVS:        1.30 cm LVOT diam:     2.10 cm LV SV:         69 LV SV Index:   36 LVOT Area:     3.46 cm 3D Volume EF:                         3D EF:        30 %                         LV EDV:       314 ml                         LV ESV:       220 ml                         LV SV:        94 ml RIGHT VENTRICLE             IVC RV Basal diam:  3.10 cm     IVC diam: 2.60 cm RV S prime:     10.90 cm/s TAPSE (M-mode): 1.4 cm LEFT ATRIUM             Index       RIGHT ATRIUM           Index LA diam:        4.70 cm 2.45 cm/m  RA Area:     17.10 cm LA Vol (A2C):   65.5 ml 34.11 ml/m RA Volume:   41.90 ml  21.82 ml/m LA Vol (A4C):   68.4 ml 35.62 ml/m LA Biplane Vol: 68.0 ml 35.42 ml/m  AORTIC VALVE AV Area (Vmax):    3.53 cm AV Area (Vmean):   3.16 cm AV Area (VTI):     3.79 cm AV Vmax:           101.00 cm/s AV Vmean:          69.700 cm/s AV VTI:            0.183 m AV Peak Grad:      4.1 mmHg AV Mean Grad:      2.0 mmHg LVOT Vmax:         103.00 cm/s LVOT Vmean:        63.500 cm/s LVOT VTI:          0.200 m LVOT/AV VTI ratio: 1.09  AORTA Ao Root diam: 3.50 cm Ao Asc diam:  3.20 cm  SHUNTS Systemic VTI:  0.20 m Systemic Diam: 2.10 cm Ena Dawley MD Electronically signed by Ena Dawley MD Signature Date/Time: 10/18/2020/3:18:18 PM    Final    CT Angio Chest/Abd/Pel for Dissection W and/or Wo Contrast  Result Date: 10/16/2020 CLINICAL DATA:  Abdominal pain, aortic dissection suspected. EXAM: CT ANGIOGRAPHY CHEST,  ABDOMEN AND PELVIS TECHNIQUE: Non-contrast CT of the chest was initially obtained. Multidetector CT imaging through the chest, abdomen and pelvis was performed using the standard protocol during bolus administration of intravenous contrast. Multiplanar reconstructed images and MIPs were obtained and reviewed to evaluate the vascular anatomy. CONTRAST:  184mL OMNIPAQUE IOHEXOL 350 MG/ML SOLN COMPARISON:  CT abdomen pelvis 11/03/2015, CT abdomen pelvis 06/14/2006 FINDINGS: VASCULAR Aorta: Moderate to severe calcified and noncalcified atherosclerotic plaque. Normal caliber aorta without aneurysm, dissection, vasculitis or significant stenosis. Celiac: Mild atherosclerotic plaque. Patent without evidence of aneurysm, dissection, vasculitis or significant stenosis. SMA: Severe atherosclerotic plaque with severe narrowing of the origin. Patent without evidence of aneurysm, dissection, vasculitis. Renals: At least moderate atherosclerotic plaque with mild to moderate stenosis of the origins. Both renal arteries are patent without evidence of aneurysm, dissection, vasculitis, fibromuscular dysplasia or significant stenosis. IMA: Patent without evidence of aneurysm, dissection, vasculitis or significant stenosis. Inflow: Moderate to severe atherosclerotic plaque. Patent without evidence of aneurysm, dissection, vasculitis or significant stenosis. Veins: No obvious venous abnormality within the limitations of this arterial phase study. Review of the MIP images confirms the above findings. NON-VASCULAR CTA CHEST FINDINGS Cardiovascular: Preferential opacification of the thoracic aorta. No evidence of thoracic aortic aneurysm or dissection. Normal heart size. No significant pericardial effusion. At least moderate four-vessel coronary artery calcifications status post coronary artery bypass. The main pulmonary artery is normal in caliber. No central pulmonary embolus. Mediastinum/Nodes: No enlarged mediastinal, hilar, or  axillary lymph nodes. Thyroid gland, trachea, and esophagus demonstrate no significant findings. Lungs/Pleura: Right lower lobe scattered peribronchovascular ground-glass airspace opacities (6:35, 47, 49). Nodular-like region within the right lower lobe measures up to 1.3 cm. Similar finding within left upper lobe (6:25). Nodular-ike region within the left upper lobe measures up to 7 mm. No pulmonary mass. No pleural effusion. No pneumothorax. Musculoskeletal: No chest wall abnormality No suspicious lytic or blastic osseous lesions. No acute displaced fracture. CTA ABDOMEN AND PELVIS FINDINGS Hepatobiliary: No focal liver abnormality is seen. Status post cholecystectomy. No biliary dilatation. Pancreas: No focal lesion. Normal pancreatic contour. No surrounding inflammatory changes. No main pancreatic ductal dilatation. Spleen: Normal in size without focal abnormality. Adrenals/Urinary Tract: No adrenal nodule bilaterally. Bilateral kidneys enhance symmetrically. Lobulated renal parenchyma with associated scarring and suggestion of innumerable cystic lesions. Suggestion of ill-defined underlying renal lesions of which measure simple fluid (7:164, 161). Subcentimeter hypodensities are too small to characterize. No hydronephrosis. No hydroureter. The urinary bladder is unremarkable. Stomach/Bowel: Stomach is within normal limits. No evidence of bowel wall thickening or dilatation. Scattered sigmoid diverticulosis. Appendix appears normal. Lymphatic: No abdominal, pelvic, or inguinal lymphadenopathy. Reproductive: Prostate is unremarkable. Other: No intraperitoneal free fluid. No intraperitoneal free gas. No organized fluid collection. Musculoskeletal: Small fat containing left inguinal hernia. Possible tiny fat containing right inguinal hernia. No suspicious lytic or blastic osseous lesions. No acute displaced fracture. Old healed left rib fractures. Old healed left rib fractures. Multilevel degenerative changes of  the spine with intervertebral disc space narrowing and vacuum phenomenon at the L4-L5 and L5-S1 levels. Trace retrolisthesis of L5 on S1. Review of the MIP images confirms the above findings. IMPRESSION: VASCULAR: 1. No aortic aneurysm or dissection. Aortic Atherosclerosis (ICD10-I70.0). 2. 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 NON-VASCULAR: 1. Vague peribronchovascular ground-glass airspace opacities, some of which are nodular-like, within the right lower lobe and left upper lobes. Findings could represent infection/inflammation with COVID 19 infection not excluded. Non-contrast chest CT at 3-6 months is recommended. If the nodules are stable at time of repeat CT, then future CT at 18-24 months (from today's scan) is considered optional for low-risk patients, but is recommended for high-risk patients. This recommendation follows the consensus statement: Guidelines for Management of Incidental Pulmonary Nodules Detected on CT Images: From the  Fleischner Society 2017; Radiology 2017; [phone]. 2. Interval development of multiple renal cystic lesions that are poorly visualized/evaluated. Recommend dedicated cross-sectional imaging renal protocol for further evaluation. 3. Scattered sigmoid diverticulosis with no acute diverticulitis. 4. Bilateral fat containing inguinal hernias. Electronically Signed   By: Iven Finn M.D.   On: 10/16/2020 21:10      Subjective: Patient seen and examined after the procedure.  He tells me that he wants to go home right now as he have to clean his house.  Refused hemodialysis today and tomorrow.   He is alert and oriented and can make decisions for himself.  He does not want to stay any longer in the hospital.  He understands the risk of not being compliant with his dialysis.  He denies any complaints including headache, blurry vision, chest pain, shortness of breath, palpitation, nausea,  vomiting, abdominal pain, leg swelling, bowel changes. Discharge Exam: Vitals:   10/19/20 0930 10/19/20 1103  BP: (!) 134/50 (!) 156/75  Pulse: 85 90  Resp: 12 16  Temp:  98.4 F (36.9 C)  SpO2: 99% 100%   Vitals:   10/19/20 0911 10/19/20 0921 10/19/20 0930 10/19/20 1103  BP: (!) 124/52 (!) 120/49 (!) 134/50 (!) 156/75  Pulse: 91 85 85 90  Resp: 19 16 12 16   Temp: 97.7 F (36.5 C)   98.4 F (36.9 C)  TempSrc: Temporal   Oral  SpO2: 100% 100% 99% 100%  Weight:      Height:        General: Pt is alert, awake, not in acute distress Cardiovascular: RRR, S1/S2 +, no rubs, no gallops Respiratory: CTA bilaterally, no wheezing, no rhonchi Abdominal: Soft, NT, ND, bowel sounds + Extremities: no edema, no cyanosis    The results of significant diagnostics from this hospitalization (including imaging, microbiology, ancillary and laboratory) are listed below for reference.     Microbiology: Recent Results (from the past 240 hour(s))  Resp Panel by RT-PCR (Flu A&B, Covid) Nasopharyngeal Swab     Status: None   Collection Time: 10/16/20  7:01 PM   Specimen: Nasopharyngeal Swab; Nasopharyngeal(NP) swabs in vial transport medium  Result Value Ref Range Status   SARS Coronavirus 2 by RT PCR NEGATIVE NEGATIVE Final    Comment: (NOTE) SARS-CoV-2 target nucleic acids are NOT DETECTED.  The SARS-CoV-2 RNA is generally detectable in upper respiratory specimens during the acute phase of infection. The lowest concentration of SARS-CoV-2 viral copies this assay can detect is 138 copies/mL. A negative result does not preclude SARS-Cov-2 infection and should not be used as the sole basis for treatment or other patient management decisions. A negative result may occur with  improper specimen collection/handling, submission of specimen other than nasopharyngeal swab, presence of viral mutation(s) within the areas targeted by this assay, and inadequate number of viral copies(<138 copies/mL).  A negative result must be combined with clinical observations, patient history, and epidemiological information. The expected result is Negative.  Fact Sheet for Patients:  EntrepreneurPulse.com.au  Fact Sheet for Healthcare Providers:  IncredibleEmployment.be  This test is no t yet approved or cleared by the [place] FDA and  has been authorized for detection and/or diagnosis of SARS-CoV-2 by FDA under an Emergency Use Authorization (EUA). This EUA will remain  in effect (meaning this test can be used) for the duration of the COVID-19 declaration under Section 564(b)(1) of the Act, 21 U.S.C.section 360bbb-3(b)(1), unless the authorization is terminated  or revoked sooner.       Influenza  A by PCR NEGATIVE NEGATIVE Final   Influenza B by PCR NEGATIVE NEGATIVE Final    Comment: (NOTE) The Xpert Xpress SARS-CoV-2/FLU/RSV plus assay is intended as an aid in the diagnosis of influenza from Nasopharyngeal swab specimens and should not be used as a sole basis for treatment. Nasal washings and aspirates are unacceptable for Xpert Xpress SARS-CoV-2/FLU/RSV testing.  Fact Sheet for Patients: EntrepreneurPulse.com.au  Fact Sheet for Healthcare Providers: IncredibleEmployment.be  This test is not yet approved or cleared by the [place] FDA and has been authorized for detection and/or diagnosis of SARS-CoV-2 by FDA under an Emergency Use Authorization (EUA). This EUA will remain in effect (meaning this test can be used) for the duration of the COVID-19 declaration under Section 564(b)(1) of the Act, 21 U.S.C. section 360bbb-3(b)(1), unless the authorization is terminated or revoked.  Performed at [facility], [place] [address] [place], [place] [address]      Labs: BNP (last 3 results) No results for input(s): BNP in the last 8760 hours. Basic Metabolic Panel: Recent Labs  Lab 10/16/20 1807  10/17/20 0139 10/17/20 1401 10/18/20 0231 10/19/20 0128  NA 133*  --  128* 135 131*  K 6.2*  --  4.2 4.2 4.5  CL 95*  --  93* 97* 95*  CO2 13*  --  21* 27 25  GLUCOSE 197*  --  296* 104* 89  BUN 203*  --  154* 81* 85*  CREATININE 8.92* 8.61* 6.76* 6.01* 7.34*  CALCIUM 8.0*  --  7.6* 8.5* 8.0*  MG  --   --   --  2.3 2.1  PHOS  --   --  5.6*  --   --    Liver Function Tests: Recent Labs  Lab 10/16/20 1807 10/17/20 1401  AST 32  --   ALT 20  --   ALKPHOS 80  --   BILITOT 0.4  --   PROT 7.0  --   ALBUMIN 3.3* 2.8*   Recent Labs  Lab 10/16/20 1807  LIPASE 70*   No results for input(s): AMMONIA in the last 168 hours. CBC: Recent Labs  Lab 10/16/20 1807 10/17/20 0139 10/17/20 1139 10/18/20 0231 10/19/20 0128  WBC 10.1 7.5 7.5 7.6 8.0  HGB 7.5* 6.5* 7.0* 8.7* 8.5*  HCT 22.0* 19.9* 21.3* 25.0* 24.8*  MCV 96.1 97.5 95.5 94.3 96.1  PLT 188 195 166 156 147*   Cardiac Enzymes: No results for input(s): CKTOTAL, CKMB, CKMBINDEX, TROPONINI in the last 168 hours. BNP: Invalid input(s): POCBNP CBG: Recent Labs  Lab 10/18/20 2050 10/19/20 0002 10/19/20 0350 10/19/20 0730 10/19/20 1156  GLUCAP 230* 100* 109* 106* 234*   D-Dimer No results for input(s): DDIMER in the last 72 hours. Hgb A1c No results for input(s): HGBA1C in the last 72 hours. Lipid Profile No results for input(s): CHOL, HDL, LDLCALC, TRIG, CHOLHDL, LDLDIRECT in the last 72 hours. Thyroid function studies Recent Labs    10/17/20 0139  TSH 0.748   Anemia work up No results for input(s): VITAMINB12, FOLATE, FERRITIN, TIBC, IRON, RETICCTPCT in the last 72 hours. Urinalysis    Component Value Date/Time   COLORURINE YELLOW 10/16/2020 2136   APPEARANCEUR CLEAR 10/16/2020 2136   LABSPEC 1.016 10/16/2020 2136   PHURINE 5.0 10/16/2020 2136   GLUCOSEU NEGATIVE 10/16/2020 2136   GLUCOSEU NEG mg/dL 09/20/2010 2058   HGBUR SMALL (A) 10/16/2020 2136   HGBUR negative 05/31/2010 0948   BILIRUBINUR  NEGATIVE 10/16/2020 2136   KETONESUR NEGATIVE 10/16/2020  2136   PROTEINUR 30 (A) 10/16/2020 2136   UROBILINOGEN 0.2 06/24/2015 2336   NITRITE NEGATIVE 10/16/2020 2136   LEUKOCYTESUR NEGATIVE 10/16/2020 2136   Sepsis Labs Invalid input(s): PROCALCITONIN,  WBC,  LACTICIDVEN Microbiology Recent Results (from the past 240 hour(s))  Resp Panel by RT-PCR (Flu A&B, Covid) Nasopharyngeal Swab     Status: None   Collection Time: 10/16/20  7:01 PM   Specimen: Nasopharyngeal Swab; Nasopharyngeal(NP) swabs in vial transport medium  Result Value Ref Range Status   SARS Coronavirus 2 by RT PCR NEGATIVE NEGATIVE Final    Comment: (NOTE) SARS-CoV-2 target nucleic acids are NOT DETECTED.  The SARS-CoV-2 RNA is generally detectable in upper respiratory specimens during the acute phase of infection. The lowest concentration of SARS-CoV-2 viral copies this assay can detect is 138 copies/mL. A negative result does not preclude SARS-Cov-2 infection and should not be used as the sole basis for treatment or other patient management decisions. A negative result may occur with  improper specimen collection/handling, submission of specimen other than nasopharyngeal swab, presence of viral mutation(s) within the areas targeted by this assay, and inadequate number of viral copies(<138 copies/mL). A negative result must be combined with clinical observations, patient history, and epidemiological information. The expected result is Negative.  Fact Sheet for Patients:  EntrepreneurPulse.com.au  Fact Sheet for Healthcare Providers:  IncredibleEmployment.be  This test is no t yet approved or cleared by the [place] FDA and  has been authorized for detection and/or diagnosis of SARS-CoV-2 by FDA under an Emergency Use Authorization (EUA). This EUA will remain  in effect (meaning this test can be used) for the duration of the COVID-19 declaration under Section 564(b)(1)  of the Act, 21 U.S.C.section 360bbb-3(b)(1), unless the authorization is terminated  or revoked sooner.       Influenza A by PCR NEGATIVE NEGATIVE Final   Influenza B by PCR NEGATIVE NEGATIVE Final    Comment: (NOTE) The Xpert Xpress SARS-CoV-2/FLU/RSV plus assay is intended as an aid in the diagnosis of influenza from Nasopharyngeal swab specimens and should not be used as a sole basis for treatment. Nasal washings and aspirates are unacceptable for Xpert Xpress SARS-CoV-2/FLU/RSV testing.  Fact Sheet for Patients: EntrepreneurPulse.com.au  Fact Sheet for Healthcare Providers: IncredibleEmployment.be  This test is not yet approved or cleared by the [place] FDA and has been authorized for detection and/or diagnosis of SARS-CoV-2 by FDA under an Emergency Use Authorization (EUA). This EUA will remain in effect (meaning this test can be used) for the duration of the COVID-19 declaration under Section 564(b)(1) of the Act, 21 U.S.C. section 360bbb-3(b)(1), unless the authorization is terminated or revoked.  Performed at [facility], [place] [address], [place], [place] [address]      Time coordinating discharge: Over 30 minutes  SIGNED:   Mckinley Jewel, MD  [facility] 10/19/2020, 1:50 PM Pager   If 7PM-7AM, please contact night-coverage [email or web address]

## 2020-10-19 NOTE — Telephone Encounter (Signed)
Patient has a question about the medication Brilenta while taking aspirin. Please call back

## 2020-10-19 NOTE — Progress Notes (Signed)
Floor nurse was called to get report for the patient to have a HD today. But patient refused to have a dialysis today according to the nurse.

## 2020-10-19 NOTE — Transfer of Care (Signed)
Immediate Anesthesia Transfer of Care Note  Patient: Jacob [name]  Procedure(s) Performed: ESOPHAGOGASTRODUODENOSCOPY (EGD) WITH PROPOFOL (N/A ) HOT HEMOSTASIS (ARGON PLASMA COAGULATION/BICAP) (N/A )  Patient Location: PACU  Anesthesia Type:MAC  Level of Consciousness: awake, alert , oriented and patient cooperative  Airway & Oxygen Therapy: Patient Spontanous Breathing and Patient connected to nasal cannula oxygen  Post-op Assessment: Report given to RN and Post -op Vital signs reviewed and stable  Post vital signs: Reviewed and stable  Last Vitals:  Vitals Value Taken Time  BP 124/52 10/19/20 0911  Temp 36.5 C 10/19/20 0911  Pulse 86 10/19/20 0915  Resp 15 10/19/20 0915  SpO2 100 % 10/19/20 0915  Vitals shown include unvalidated device data.  Last Pain:  Vitals:   10/19/20 0911  TempSrc: Temporal  PainSc: 0-No pain         Complications: No complications documented.

## 2020-10-19 NOTE — Progress Notes (Signed)
Renal Navigator received message from Renal PA/K. Stovall that patient is refusing HD today and asked that Navigator reschedule patient at outpatient clinic tomorrow.  Navigator met with patient to discuss. Patient reports, "I just want to go home. I will get back on schedule Wednesday." Navigator explained that today is his HD day and that waiting until Wednesday will mean that he only has two HD treatments this week. Navigator suggests that if he is refusing to have HD today, he should make up this treatment tomorrow. Navigator offered to schedule his transportation for tomorrow also. He refused this and again stated that he will have his next HD treatment Wednesday. Navigator states this this is his decision, but not our recommendation. Navigator encouraged him to be very careful about his fluid intake between now and Wednesday. Patient told Navigator that he will, and that he will make sure that he does not miss Wednesday at his clinic. He states he did not have a Christmas because he was here. He says he has a [place] at home and he wants to go home and cook it. He reports no needs or concerns and just wants to go home. Navigator updated Renal PA and sent secure message to Attending.  Jacob [name], [place] Renal Navigator [phone]

## 2020-10-19 NOTE — Anesthesia Postprocedure Evaluation (Signed)
Anesthesia Post Note  Patient: Jacob [name]  Procedure(s) Performed: ESOPHAGOGASTRODUODENOSCOPY (EGD) WITH PROPOFOL (N/A ) HOT HEMOSTASIS (ARGON PLASMA COAGULATION/BICAP) (N/A )     Patient location during evaluation: Endoscopy Anesthesia Type: MAC Level of consciousness: awake and alert, patient cooperative and oriented Pain management: pain level controlled Vital Signs Assessment: post-procedure vital signs reviewed and stable Respiratory status: spontaneous breathing, nonlabored ventilation and respiratory function stable Cardiovascular status: blood pressure returned to baseline and stable Postop Assessment: no apparent nausea or vomiting Anesthetic complications: no   No complications documented.  Last Vitals:  Vitals:   10/19/20 0911 10/19/20 0921  BP: (!) 124/52 (!) 120/49  Pulse: 91 85  Resp: 19 16  Temp: 36.5 C   SpO2: 100% 100%    Last Pain:  Vitals:   10/19/20 0921  TempSrc:   PainSc: 0-No pain                 [name],E. Emran Molzahn

## 2020-10-20 ENCOUNTER — Ambulatory Visit (INDEPENDENT_AMBULATORY_CARE_PROVIDER_SITE_OTHER): Payer: Medicare Other | Admitting: Orthopaedic Surgery

## 2020-10-20 ENCOUNTER — Other Ambulatory Visit: Payer: Self-pay

## 2020-10-20 ENCOUNTER — Telehealth: Payer: Self-pay | Admitting: *Deleted

## 2020-10-20 ENCOUNTER — Encounter: Payer: Self-pay | Admitting: Orthopaedic Surgery

## 2020-10-20 VITALS — BP 132/62 | HR 88 | Ht 74.0 in | Wt 172.0 lb

## 2020-10-20 DIAGNOSIS — Z9889 Other specified postprocedural states: Secondary | ICD-10-CM

## 2020-10-20 NOTE — Telephone Encounter (Signed)
Transition Care Management Follow-Up Telephone Call   Date discharged and where:10/19/2020 [facility]  How have you been since you were released from the hospital? Better, leg hurts but seeing Dr. Lorin Mercy today for it.   Any patient concerns? No  Items Reviewed:   Meds: Yes  Allergies:Yes  Dietary Changes Reviewed:Yes  Functional Questionnaire:  Independent-I Dependent-D  ADLs:I   Dressing- I    Eating-I   Maintaining continence-I   Transferring-I   Transportation-D   Meal Prep-I   Managing Meds- I  Confirmed importance and Date/Time of follow-up visits scheduled:Patient refused to schedule an appointment at this time. Stated that he will call back after talking with his ride, I expressed the importance of an appointment. Has Dialysis appointments, Dr. Lorin Mercy and Cardiologist appointments coming up.    Confirmed with patient if condition worsens to call PCP or go to the Emergency Dept. Patient was given office number and encouraged to call back with questions or concerns: Yes

## 2020-10-20 NOTE — Telephone Encounter (Signed)
Would continue brillinta and 81 mg ASA if anemia doesn't get worse

## 2020-10-20 NOTE — Progress Notes (Signed)
Office Visit Note   Patient: Jacob [name]           Date of Birth: 1950-12-04           MRN: [id number] Visit Date: 10/20/2020              Requested by: [name], NP [address] [place],  [place] [address] PCP: [name], NP   Assessment & Plan: Visit Diagnoses: History of lumbar laminectomy decompression L4-5  for intraspinal extradural facet cyst.  Plan: Patient not sure if symptoms are severe enough to consider repeat diagnostic imaging and surgical intervention.  We will check him back in 3 months.  He will call if he like to try some physical therapy.  He will continue to work on a walking program.  Lumbar MRI would require with and without contrast from his previous surgery.  Follow-Up Instructions: Return in about 3 months (around 01/18/2021).   Orders:  No orders of the defined types were placed in this encounter.  No orders of the defined types were placed in this encounter.     Procedures: No procedures performed   Clinical Data: No additional findings.   Subjective: Chief Complaint  Patient presents with  . Lower Back - Pain  . Right Hip - Pain    HPI 69 year old male on chronic dialysis seen with low back pain is been present for several months.  Patient recently stopped his Brilinta and ended up in the hospital over Christmas.  He states his back tends to bother him right after dialysis radiates into his right hip and down his right leg down toward his foot.  Patient states his symptoms are mild to moderate in severity.  No associated bowel bladder symptoms no chills or fever.  He notes he is on a lot of medication.  The day after dialysis he states his back is doing much better.  Previous decompression L4-5 by me 2017 for removal of extradural intraspinal facet cyst with decompression L4-5 level.  Review of Systems chronic renal failure on chronic dialysis.  Type 2 diabetes ischemic cardiomyopathy.  Rest systems are noncontributory to  HPI.   Objective: Vital Signs: BP 132/62   Pulse 88   Ht 6\' 2"  (1.88 m)   Wt 172 lb (78 kg)   BMI 22.08 kg/m   Physical Exam Constitutional:      Appearance: He is well-developed and well-nourished.  HENT:     Head: Normocephalic and atraumatic.  Eyes:     Extraocular Movements: EOM normal.     Pupils: Pupils are equal, round, and reactive to light.  Neck:     Thyroid: No thyromegaly.     Trachea: No tracheal deviation.  Cardiovascular:     Rate and Rhythm: Normal rate.  Pulmonary:     Effort: Pulmonary effort is normal.     Breath sounds: No wheezing.  Abdominal:     General: Bowel sounds are normal.     Palpations: Abdomen is soft.  Skin:    General: Skin is warm and dry.     Capillary Refill: Capillary refill takes less than 2 seconds.  Neurological:     Mental Status: He is alert and oriented to person, place, and time.  Psychiatric:        Mood and Affect: Mood and affect normal.        Behavior: Behavior normal.        Thought Content: Thought content normal.  Judgment: Judgment normal.     Ortho Exam patient ambulates with slight knee flexion slight hip flexion.  He has some mild atrophy quads hamstrings and calf muscles symmetrical.  Negative logroll to the hips.  Previous trochanteric nail left hip from hip fracture fixed by me.  Specialty Comments:  No specialty comments available.  Imaging: CLINICAL DATA:  Fall, hip pain  EXAM: DG HIP (WITH OR WITHOUT PELVIS) 2-3V RIGHT  COMPARISON:  None.  FINDINGS: No acute bony abnormality. Specifically, no fracture, subluxation, or dislocation. Hip joint maintained. Diffuse vascular calcifications.  IMPRESSION: No acute bony abnormality.   Electronically Signed   By: Rolm Baptise M.D.   On: 09/08/2020 20:41 CLINICAL DATA:  Fall, low back pain  EXAM: LUMBAR SPINE - COMPLETE 4+ VIEW  COMPARISON:  None.  FINDINGS: Degenerative disc disease most pronounced at L4-5 and L5-S1  with disc space narrowing, spurring and vacuum disc. Degenerative facet disease from L3-4 through L5-S1. Slight degenerative anterolisthesis of L4 on L5. No fracture. Heavily calcified aorta, branch vessels and iliac vessels. No visible aneurysm. SI joints symmetric and unremarkable.  IMPRESSION: Degenerative disc and facet disease as above.  No acute bony abnormality.   Electronically Signed   By: Rolm Baptise M.D.   On: 09/08/2020 19:58  PMFS History: Patient Active Problem List   Diagnosis Date Noted  . Uremia   . Acute GI bleeding 10/17/2020  . AVM (arteriovenous malformation) of small bowel, acquired with hemorrhage   . Fluid overload 10/16/2020  . Cardiomyopathy ([place]) 12/21/2019  . Iron deficiency anemia   . Acute blood loss anemia   . Acute on chronic anemia   . AVM (arteriovenous malformation) of duodenum, acquired   . Abnormal nuclear stress test 12/19/2019  . ESRD (end stage renal disease) on dialysis ([place])   . Hand pain, left   . Cocaine abuse ([place])   . Hypertensive emergency   . CHF exacerbation ([place]) 07/21/2019  . Diabetic foot ulcer ([place]) 02/11/2019  . Gastric AVM   . Melena 02/07/2019  . CKD stage 4 due to type 2 diabetes mellitus ([place]) 05/30/2018  . Hyperlipidemia associated with type 2 diabetes mellitus ([place]) 05/30/2018  . Right foot ulcer ([place]) 02/28/2018  . Chronic obstructive pulmonary disease ([place]) 02/28/2018  . Anemia of chronic disease 11/20/2016  . CKD (chronic kidney disease) stage 3, GFR 30-59 ml/min (HCC) 11/20/2016  . CHF (congestive heart failure) ([place]) 11/10/2016  . History of lumbar laminectomy for spinal cord decompression 02/29/2016  . Type 2 diabetes mellitus with vascular disease ([place]) 06/17/2015  . HTN (hypertension) 04/26/2015  . DM (diabetes mellitus) ([place]) 04/26/2015  . Ischemic cardiomyopathy 05/12/2014  . Ulcer of lower extremity ([place]) 09/06/2013  . Hyperkalemia 09/06/2013  . Chest pain 09/06/2013  . Type II or  unspecified type diabetes mellitus with unspecified complication, not stated as uncontrolled 10/22/2012  . Bunion of left foot 11/25/2011  . Bunion, right foot 11/25/2011  . Polysubstance abuse ([place]) 07/28/2011  . Hip fracture, left ([place]) 04/04/2011  . Closed fracture of neck of femur ([place]) 04/04/2011  . Insomnia 02/18/2011  . CAD (coronary artery disease) 04/20/2009  . Acute on chronic combined systolic (congestive) and diastolic (congestive) heart failure ([place]) 04/20/2009  . HLD (hyperlipidemia) 11/20/2006  . Gout 11/20/2006  . TOBACCO ABUSE 11/20/2006  . Essential hypertension 11/20/2006  . Human immunodeficiency virus (HIV) disease ([place]) 09/02/2006   Past Medical History:  Diagnosis Date  . Acute respiratory failure ([place]) 03/01/2018  . Arthritis    "  all over; mostly knees and back" (02/28/2018)  . Chronic lower back pain    stenosis  . Community acquired pneumonia 09/06/2013  . COPD (chronic obstructive pulmonary disease) ([place])   . Coronary atherosclerosis of native coronary artery    a. 02/2003 s/p CABG x 2 (VG->RI, VG->RPDA; b. 11/2019 PCI: LM nl, LAD 90d, D3 50, RI 100, LCX 100p, OM3 100 - fills via L->L collats from D2/dLAD, RCA 100p, VG->RPDA ok, VG->RI 95 (3.5x48 Synergy XD DES).  . Drug abuse ([place])    hx; tested for cocaine as recently as 2/08. says he is not using drugs now - avoided defib. for this reason   . ESRD (end stage renal disease) ([place])    Hemo M-W-F- Richarda Blade  . GERD (gastroesophageal reflux disease)    takes OTC meds as needed  . GI bleeding    a. 11/2019 EGD: angiodysplastic lesions w/ bleeding s/p argon plasma/clipping/epi inj.  . Glaucoma    uses eye drops daily  . Hepatitis B 1968   "tx'd w/isolation; caught it from toilet stools in gym"  . HFrEF (heart failure with reduced ejection fraction) ([place])    a. 01/2019 Echo: EF 40-45%, diffuse HK, mild basal septal hypertrophy.  . History of blood transfusion 03/01/2019  . History of colon polyps    benign  .  History of gout    takes Allopurinol daily as well as Colchicine-if needed (02/28/2018)  . History of kidney stones   . HTN (hypertension)    takes Coreg,Imdur.and Apresoline daily  . Human immunodeficiency virus (HIV) disease ([place]) dx'd 1995   takes Genvoya daily  . Hyperlipidemia    takes Atorvastatin daily  . Ischemic cardiomyopathy    a. 01/2019 Echo: EF 40-45%, diffuse HK, mild basal septal hypertrophy. Diast dysfxn. Nl RV size/fxn. Sev dil LA. Triv MR/TR/PR.  [name] Muscle spasm    takes Zanaflex as needed  . Myocardial infarction ([place]) ~ 2004/2005  . Nocturia   . Peripheral neuropathy    takes gabapentin daily  . Pneumonia    "at least twice" (02/28/2018)  . Shortness of breath dyspnea    rarely but if notices it then with exertion  . Syphilis, unspecified   . Type II diabetes mellitus ([place]) 2004   Lantus daily.Average fasting blood sugar 125-199  . Wears glasses   . Wears partial dentures     Family History  Problem Relation Age of Onset  . Heart failure Father   . Hypertension Father   . Diabetes Brother   . Heart attack Brother   . Alzheimer's disease Mother   . Stroke Sister   . Diabetes Sister   . Alzheimer's disease Sister   . Hypertension Brother   . Diabetes Brother   . Drug abuse Brother   . Colon cancer Neg Hx     Past Surgical History:  Procedure Laterality Date  . AV FISTULA PLACEMENT Left 08/02/2018   Procedure: ARTERIOVENOUS (AV) FISTULA CREATION  left arm radiocephlic;  Surgeon: Marty Heck, MD;  Location: [facility];  Service: Vascular;  Laterality: Left;  . AV FISTULA PLACEMENT Left 08/01/2019   Procedure: LEFT BRACHIOCEPHALIC ARTERIOVENOUS (AV) FISTULA CREATION;  Surgeon: [name], MD;  Location: [facility];  Service: Vascular;  Laterality: Left;  . BASCILIC VEIN TRANSPOSITION Left 10/03/2019   Procedure: BASILIC VEIN TRANSPOSITION LEFT SECOND STAGE;  Surgeon: [name], MD;  Location: [facility];  Service: Vascular;  Laterality: Left;  . CARDIAC  CATHETERIZATION  10/2002; 12/19/2004   /  notes 03/08/2011  . COLONOSCOPY  2013   Dr.John Henrene Pastor   . CORONARY ARTERY BYPASS GRAFT  02/24/2003   CABG X2/notes 03/08/2011  . CORONARY STENT INTERVENTION N/A 12/19/2019   Procedure: CORONARY STENT INTERVENTION;  Surgeon: Jettie Booze, MD;  Location: [facility];  Service: Cardiovascular;  Laterality: N/A;  . ESOPHAGOGASTRODUODENOSCOPY (EGD) WITH PROPOFOL N/A 02/08/2019   Procedure: ESOPHAGOGASTRODUODENOSCOPY (EGD) WITH PROPOFOL;  Surgeon: Milus Banister, MD;  Location: [facility];  Service: Gastroenterology;  Laterality: N/A;  . ESOPHAGOGASTRODUODENOSCOPY (EGD) WITH PROPOFOL N/A 12/22/2019   Procedure: ESOPHAGOGASTRODUODENOSCOPY (EGD) WITH PROPOFOL;  Surgeon: Lavena Bullion, DO;  Location: [facility];  Service: Gastroenterology;  Laterality: N/A;  . HEMOSTASIS CLIP PLACEMENT  12/22/2019   Procedure: HEMOSTASIS CLIP PLACEMENT;  Surgeon: Lavena Bullion, DO;  Location: [facility];  Service: Gastroenterology;;  . HOT HEMOSTASIS N/A 02/08/2019   Procedure: HOT HEMOSTASIS (ARGON PLASMA COAGULATION/BICAP);  Surgeon: Milus Banister, MD;  Location[facility];  Service: Gastroenterology;  Laterality: N/A;  . HOT HEMOSTASIS N/A 12/22/2019   Procedure: HOT HEMOSTASIS (ARGON PLASMA COAGULATION/BICAP);  Surgeon: Lavena Bullion, DO;  Location: [facility] ENDOSCOPY;  Service: Gastroenterology;  Laterality: N/A;  . INTERTROCHANTERIC HIP FRACTURE SURGERY Left 11/2006   [name] 03/08/2011  . INTRAVASCULAR ULTRASOUND/IVUS N/A 12/19/2019   Procedure: Intravascular Ultrasound/IVUS;  Surgeon: Jettie Booze, MD;  Location: [facility];  Service: Cardiovascular;  Laterality: N/A;  . IR FLUORO GUIDE CV LINE RIGHT  07/24/2019  . IR FLUORO GUIDE CV LINE RIGHT  07/30/2019  . IR US GUIDE VASC ACCESS RIGHT  07/24/2019  . IR US GUIDE VASC ACCESS RIGHT  07/30/2019  . LAPAROSCOPIC CHOLECYSTECTOMY  05/2006  . LIGATION OF COMPETING BRANCHES OF  ARTERIOVENOUS FISTULA Left 11/05/2018   Procedure: LIGATION OF COMPETING BRANCHES OF ARTERIOVENOUS FISTULA  LEFT  ARM;  Surgeon: Marty Heck, MD;  Location: [facility];  Service: Vascular;  Laterality: Left;  . LUMBAR LAMINECTOMY/DECOMPRESSION MICRODISCECTOMY N/A 02/29/2016   Procedure: Left L4-5 Lateral Recess Decompression, Removal Extradural Intraspinal Facet Cyst;  Surgeon: Marybelle Killings, MD;  Location: [facility];  Service: Orthopedics;  Laterality: N/A;  . MULTIPLE TOOTH EXTRACTIONS    . ORIF MANDIBULAR FRACTURE Left 08/13/2004   ORIF of left body fracture mandible with KLS Martin 2.3-mm six hole/notes 03/08/2011  . RIGHT/LEFT HEART CATH AND CORONARY/GRAFT ANGIOGRAPHY N/A 12/19/2019   Procedure: RIGHT/LEFT HEART CATH AND CORONARY/GRAFT ANGIOGRAPHY;  Surgeon: Jettie Booze, MD;  Location: [facility];  Service: Cardiovascular;  Laterality: N/A;  . SCLEROTHERAPY  12/22/2019   Procedure: SCLEROTHERAPY;  Surgeon: Lavena Bullion, DO;  Location: [facility] ENDOSCOPY;  Service: Gastroenterology;;   Social History   Occupational History  . Occupation: retired  Tobacco Use  . Smoking status: Current Every Day Smoker    Packs/day: 0.50    Years: 43.00    Pack years: 21.50    Types: Cigarettes  . Smokeless tobacco: Never Used  Vaping Use  . Vaping Use: Never used  Substance and Sexual Activity  . Alcohol use: Yes    Alcohol/week: 12.0 standard drinks    Types: 12 Standard drinks or equivalent per week    Comment: occ  . Drug use: No    Types: Cocaine    Comment: hx of crack/cocaine 70yrs ago 10/01/2019- none  . Sexual activity: Yes    Partners: Male

## 2020-10-21 DIAGNOSIS — N186 End stage renal disease: Secondary | ICD-10-CM | POA: Diagnosis not present

## 2020-10-21 DIAGNOSIS — D509 Iron deficiency anemia, unspecified: Secondary | ICD-10-CM | POA: Diagnosis not present

## 2020-10-21 DIAGNOSIS — D631 Anemia in chronic kidney disease: Secondary | ICD-10-CM | POA: Diagnosis not present

## 2020-10-21 DIAGNOSIS — E1122 Type 2 diabetes mellitus with diabetic chronic kidney disease: Secondary | ICD-10-CM | POA: Diagnosis not present

## 2020-10-21 DIAGNOSIS — L299 Pruritus, unspecified: Secondary | ICD-10-CM | POA: Diagnosis not present

## 2020-10-21 DIAGNOSIS — N2581 Secondary hyperparathyroidism of renal origin: Secondary | ICD-10-CM | POA: Diagnosis not present

## 2020-10-21 DIAGNOSIS — Z992 Dependence on renal dialysis: Secondary | ICD-10-CM | POA: Diagnosis not present

## 2020-10-21 NOTE — Telephone Encounter (Signed)
Left message for patient to call back  

## 2020-10-21 NOTE — Telephone Encounter (Signed)
Patient called back. Informed patient of Dr. Kyla [name] message. Patient stated his Hemoglobin was 11 today. Informed patient that it most be improving since two days ago his hemoglobin showed 8.5. Patient verbalized understanding.

## 2020-10-22 ENCOUNTER — Other Ambulatory Visit: Payer: Self-pay | Admitting: *Deleted

## 2020-10-22 NOTE — Patient Outreach (Addendum)
[facility]) Care Management  [place]  10/22/2020   Jacob [name] April 18, 1951 [id number]    EMMI Alert Follow Up  Red on EMMI Alert- General EMMI Day:  #1 Date:  10/21/2020 Red Alert Reason:  Know who to call about changes in condition? No; Other questions/problems? Yes    Outreach Attempt:  Outreach attempt #1 to patient for EMMI Red Alert follow up. No answer. RN Care Coordinator left voicemail message along with contact information.  Plan:  RN Care Coordinator will send unsuccessful outreach letter to patient.  RN Care Coordinator will make another outreach attempt to patient within 3-4 business days if no return call back from patient.   Addendum:  Received telephone call back from patient.  HIPAA verified with patient.  EMMI Red Flag Alert  Know who to call about changes in condition? No; Other questions/problems? Yes-addressed and patient stating he had questions about his medications but now have answers.  Understands he only has one more dose of Doxycycline to take and that it is an antibiotic.  Also, states he contacted his Cardiologist about taking Aspirin and Brilinta together.  Discussed with patient Brilinta was discontinued on discharge and patient stating he was told by Cardiologist to continue to take the medication.  Upon chart review, noted conversation with patient and Cardiology.  Patient stating he is going to take Brilinta and hold on Aspirin per Cardiology recommendations until seen by Cardiologist.  Confirmed patient has upcoming appointment with Cardiology on 12/02/2020.  Patient states he understands to contact Cardiology or primary care provider for any questions or concerns related to his medications or medical condition.  Does report he did attend Hemodialysis yesterday.    [name] services reviewed and discussed with patient.  Patient declines services at this time.  Does agree to receive [facility] brochure in the mail for review.  Encouraged to  contact [facility] in the future if needs arise.  Will make inactive with [facility] at this time.    [place] [facility]  RN Telephonic Care Coordinator [phone] [name]

## 2020-10-23 DIAGNOSIS — D509 Iron deficiency anemia, unspecified: Secondary | ICD-10-CM | POA: Diagnosis not present

## 2020-10-23 DIAGNOSIS — N186 End stage renal disease: Secondary | ICD-10-CM | POA: Diagnosis not present

## 2020-10-23 DIAGNOSIS — I129 Hypertensive chronic kidney disease with stage 1 through stage 4 chronic kidney disease, or unspecified chronic kidney disease: Secondary | ICD-10-CM | POA: Diagnosis not present

## 2020-10-23 DIAGNOSIS — E1122 Type 2 diabetes mellitus with diabetic chronic kidney disease: Secondary | ICD-10-CM | POA: Diagnosis not present

## 2020-10-23 DIAGNOSIS — Z992 Dependence on renal dialysis: Secondary | ICD-10-CM | POA: Diagnosis not present

## 2020-10-23 DIAGNOSIS — L299 Pruritus, unspecified: Secondary | ICD-10-CM | POA: Diagnosis not present

## 2020-10-23 DIAGNOSIS — N2581 Secondary hyperparathyroidism of renal origin: Secondary | ICD-10-CM | POA: Diagnosis not present

## 2020-10-23 DIAGNOSIS — D631 Anemia in chronic kidney disease: Secondary | ICD-10-CM | POA: Diagnosis not present

## 2020-10-24 DIAGNOSIS — W19XXXA Unspecified fall, initial encounter: Secondary | ICD-10-CM

## 2020-10-24 DIAGNOSIS — Y92009 Unspecified place in unspecified non-institutional (private) residence as the place of occurrence of the external cause: Secondary | ICD-10-CM

## 2020-10-24 HISTORY — DX: Unspecified place in unspecified non-institutional (private) residence as the place of occurrence of the external cause: Y92.009

## 2020-10-24 HISTORY — DX: Unspecified fall, initial encounter: W19.XXXA

## 2020-10-26 ENCOUNTER — Encounter: Payer: Self-pay | Admitting: *Deleted

## 2020-10-26 ENCOUNTER — Other Ambulatory Visit: Payer: Self-pay | Admitting: *Deleted

## 2020-10-26 ENCOUNTER — Telehealth: Payer: Self-pay | Admitting: *Deleted

## 2020-10-26 DIAGNOSIS — N2581 Secondary hyperparathyroidism of renal origin: Secondary | ICD-10-CM | POA: Diagnosis not present

## 2020-10-26 DIAGNOSIS — E162 Hypoglycemia, unspecified: Secondary | ICD-10-CM | POA: Diagnosis not present

## 2020-10-26 DIAGNOSIS — L299 Pruritus, unspecified: Secondary | ICD-10-CM | POA: Diagnosis not present

## 2020-10-26 DIAGNOSIS — D509 Iron deficiency anemia, unspecified: Secondary | ICD-10-CM | POA: Diagnosis not present

## 2020-10-26 DIAGNOSIS — Z992 Dependence on renal dialysis: Secondary | ICD-10-CM | POA: Diagnosis not present

## 2020-10-26 DIAGNOSIS — E1122 Type 2 diabetes mellitus with diabetic chronic kidney disease: Secondary | ICD-10-CM | POA: Diagnosis not present

## 2020-10-26 DIAGNOSIS — N186 End stage renal disease: Secondary | ICD-10-CM | POA: Diagnosis not present

## 2020-10-26 DIAGNOSIS — D631 Anemia in chronic kidney disease: Secondary | ICD-10-CM | POA: Diagnosis not present

## 2020-10-26 NOTE — Telephone Encounter (Signed)
Patient notified

## 2020-10-26 NOTE — Patient Outreach (Addendum)
[facility]) Care Management  [place]  10/26/2020   DELL BRINER 1951/03/01 [id number]   EMMI Alert Follow Up  Red on EMMI Alert- General EMMI Day:  #4 Date:  10/24/2020 Red Alert Reason:  Other questions/problems? Yes  RN Telephonic Care Coordinator Initial Assessment  Referral Date:  10/26/2020 Referral Source:  EMMI General Discharge Reason for Referral:  EMMI Red Alert Insurance:  [employer]   Outreach Attempt:  Successful telephone outreach to patient for EMMI Red Alert follow up.  HIPAA verified with patient.  Answered EMMI Red Alert-Other questions/problems? Yes.  Patient states he has question about his pain medication Tramadol.  States he is about to run out of medication, has 4 pills left.  Reports toe fracture and having to wear boot causing leg and back pain.  Requesting assistance in getting refill of pain medication.  RN Care Coordinator outreached to PCP office and spoke with Rodena Piety.  Per Rodena Piety, she will send request to provider, but patient may need to be seen in hospital follow up prior to refill being granted.  RN Care Coordinator request Rodena Piety from PCP office follow up with patient.  Outreach to patient to update him on Anita's response and discussed he needs hospital follow up and encouraged patient to schedule appointment.  Informed patient, Rodena Piety would contact him about pain medication request.  [facility] services reviewed and discussed; patient verbally agrees to services.  Social:  Lives at home alone.  Reports being independent with ADLs and IADLs.  Reports one fall in the last year resulting in toe fracture.  Patient states he uses [employer] or [employer] transportation for medical appointments, but would like other options to use.  Discussed [place] [employer] referral for transportation options and patient agrees.  DME in the home include:  Straight cane, foot boot, post op shoe, blood pressure cuff, CBG meter, shower chair with  back, raised toilet seat, and eyeglasses.  Conditions:  Per chart review and discussion with patient, PMH include but not limited to:  Arthritis, congestive heart failure, chronic low back pain, COPD, coronary artery disease with previous CABG, end stage renal disease with hemodialysis on Monday, Wednesday, and Fridays, GERD, GI bleed, hepatitis B, glaucoma, gout, hypertension, hyperlipidemia, ischemic cardiomyopathy, peripheral neuropathy, syphilis, and diabetes.  Acknowledges recent discharge from hospital on 10/19/2020 due to missed dialysis appointments leading to chest pains and diagnosed with GI bleed.  Reports he is returning home from attending hemodialysis today.  States he has spoken with Cardiology who told him to resume Brilinta and hold taking aspirin until seen at follow up appointment on 12/02/20.  Does not weight daily and does not monitor his blood pressures at home.  Monitors blood sugars three times a day.  Fasting blood sugar this morning was 117 with latest Hgb A1C being 4.4 in April 2020.  Denies any significant episodes of hypoglycemia.  Medications: Reports taking about 16 medications daily.  Manages medications himself and denies any issues with affording medications at this time.  Encounter Medications:  Outpatient Encounter Medications as of 10/26/2020  Medication Sig Note  . acetaminophen (TYLENOL) 650 MG CR tablet Take 650 mg by mouth every 8 (eight) hours as needed for pain.   [name] allopurinol (ZYLOPRIM) 100 MG tablet TAKE 1 TABLET(100 MG) BY MOUTH DAILY (Patient taking differently: Take 100 mg by mouth daily. TAKE 1 TABLET(100 MG) BY MOUTH DAILY)   . ANORO ELLIPTA 62.5-25 MCG/INH AEPB INHALE 1 PUFF BY MOUTH EVERY DAY (Patient  taking differently: Inhale 1 puff into the lungs daily.)   . bictegravir-emtricitabine-tenofovir AF (BIKTARVY) 50-200-25 MG TABS tablet Take 1 tablet by mouth daily.   . carvedilol (COREG) 3.125 MG tablet Take 1 tablet (3.125 mg total) by mouth 2 (two) times  daily.   . Cinacalcet HCl (SENSIPAR PO) Take by mouth.    . colchicine 0.6 MG tablet Take 0.6 mg by mouth daily as needed (pain).    [name] diclofenac Sodium (VOLTAREN) 1 % GEL APPLY 2 GRAMS TOPICALLY TO THE AFFECTED AREA THREE TIMES DAILY AS NEEDED FOR PAIN (Patient taking differently: Apply 2 g topically 3 (three) times daily as needed (pain).)   . gabapentin (NEURONTIN) 300 MG capsule Take 1 capsule (300 mg total) by mouth 2 (two) times daily.   . insulin lispro (HUMALOG KWIKPEN) 100 UNIT/ML KwikPen Inject 0.61ml (20 Units total) into the skin 3 (three) times daily as needed for high blood sugar (above 150)   . isosorbide mononitrate (IMDUR) 30 MG 24 hr tablet TAKE 1 TABLET BY MOUTH EVERY DAY (Patient taking differently: Take 30 mg by mouth daily. TAKE 1 TABLET BY MOUTH EVERY DAY)   . latanoprost (XALATAN) 0.005 % ophthalmic solution Place 1 drop into both eyes at bedtime.   . multivitamin (RENA-VIT) TABS tablet Take 1 tablet by mouth daily.   . pantoprazole (PROTONIX) 40 MG tablet TAKE 1 TABLET(40 MG) BY MOUTH TWICE DAILY   . sevelamer carbonate (RENVELA) 800 MG tablet Take 800 mg by mouth 3 (three) times daily with meals.   . ticagrelor (BRILINTA) 90 MG TABS tablet Take by mouth 2 (two) times daily. 10/26/2020: Reports he was told by cardiologist to continue  . timolol (TIMOPTIC) 0.5 % ophthalmic solution Place 1 drop into both eyes every morning.   . traMADol (ULTRAM) 50 MG tablet Take 1 tablet (50 mg total) by mouth every 12 (twelve) hours as needed.   [name] aspirin 81 MG chewable tablet Chew 1 tablet (81 mg total) by mouth daily. (Patient not taking: Reported on 10/26/2020) 10/26/2020: Reports not taking  . B-D UF III MINI PEN NEEDLES 31G X 5 MM MISC USE FOUR TIMES DAILY   . iron sucrose in sodium chloride 0.9 % 100 mL Iron Sucrose (Venofer)   . Lancets (ONETOUCH DELICA PLUS [id number]) MISC Inject 1 Device as directed 3 (three) times daily. Dx: E11.40   . Methoxy PEG-Epoetin Beta (MIRCERA IJ) Mircera   .  nitroGLYCERIN (NITROSTAT) 0.3 MG SL tablet ONE TABLET UNDER TONGUE AS NEEDED FOR CHEST PAIN (Patient taking differently: Place 0.3 mg under the tongue every 5 (five) minutes as needed for chest pain.)   . Nutritional Supplements (VITAMIN D BOOSTER PO) Take by mouth.   Glory Rosebush ULTRA test strip CHECK BLOOD SUGAR 3 TIMES  DAILY    No facility-administered encounter medications on file as of 10/26/2020.    Functional Status:  In your present state of health, do you have any difficulty performing the following activities: 07/07/2020 12/19/2019  Hearing? N N  Vision? Y N  Difficulty concentrating or making decisions? N N  Walking or climbing stairs? N Y  Dressing or bathing? N N  Doing errands, shopping? N N  Preparing Food and eating ? N -  Using the Toilet? N -  In the past six months, have you accidently leaked urine? N -  Do you have problems with loss of bowel control? N -  Managing your Medications? N -  Managing your Finances? N -  Housekeeping or  managing your Housekeeping? N -  Some recent data might be hidden    Fall/Depression Screening: Fall Risk  09/08/2020 07/07/2020 06/30/2020  Falls in the past year? 1 0 0  Comment Slip and fell off chair 1 month ago. - -  Number falls in past yr: 0 0 -  Comment - - -  Injury with Fall? 1 0 -  Comment - - -  Risk Factor Category  - - -  Risk for fall due to : - - -  Risk for fall due to: Comment - - -  Follow up - - -   PHQ 2/9 Scores 08/04/2020 07/07/2020 06/30/2020 01/28/2020 11/19/2019 10/29/2019 07/08/2019  PHQ - 2 Score 0 0 0 0 0 0 0  PHQ- 9 Score - - - - - - -    Goals Addressed            This Visit's Progress   . [facility]) Follow My Treatment Plan-Chronic Kidney       Timeframe:  Long-Range Goal Priority:  High Start Date:   10/26/20                          Expected End Date:  02/20/21                     Follow Up Date 11/09/20   - ask for help if I can't afford my medicines - avoid negative self-talk - call for medicine  refill 2 or 3 days before it runs out - call the doctor or nurse before I stop taking medicine - call the doctor or nurse to get help with side effects - keep follow-up appointments - keep taking my medicines, even when I feel good  -attend scheduled hemodialysis treatments   Why is this important?    Staying as healthy as you can is very important. This may mean making changes if you smoke, don't exercise or eat poorly.   A healthy lifestyle is an important goal for you.   Following the treatment plan and making changes may be hard.   Try some of these steps to help keep the disease from getting worse.     Notes:     . [facility] Learn More About My Health       Timeframe:  Short-Term Goal Priority:  Medium Start Date:   10/26/20                          Expected End Date:  11/23/20                      Follow Up Date 11/09/20   - tell my story and reason for my visit - make a list of questions - ask questions - repeat what I heard to make sure I understand - bring a list of my medicines to the visit - speak up when I don't understand    Why is this important?    The best way to learn about your health and care is by talking to the doctor and nurse.   They will answer your questions and give you information in the way that you like best.    Notes:     . [facility]) Make and Keep All Appointments       Timeframe:  Long-Range Goal Priority:  High Start Date:  10/26/20  Expected End Date:  02/20/21                     Follow Up Date 11/09/20    - arrange a ride through an agency 1 week before appointment - ask family or friend for a ride - call to cancel if needed - keep a calendar with appointment dates  -schedule post hospital follow up appointment with PCP   Why is this important?    Part of staying healthy is seeing the doctor for follow-up care.   If you forget your appointments, there are some things you can do to stay on track.    Notes:        Appointments:  Has scheduled appointments with Cardiology, Dr. [name] on 12/02/2020 and Podiatry, Dr. Sherryle Lis on 11/11/2019.  Needs to schedule hospital follow up with primary care as soon as possible and patient encouraged to do so.  Discussed scheduling at least a Telehealth visit with primary care.  Denies issues with transportation to get to appointment.   Consent:  [facility] services reviewed and discussed.  Patient agrees to services and future outreaches.  Plan: Follow-up:  Patient agrees to Care Plan and Follow-up. RN Care Coordinator will send primary MD barriers letter. RN Care Coordinator will route initial telephone assessment note to primary MD. RN Care Coordinator will send patient [place]. RN Care Coordinator will send patient [address] [place]. RN Care Coordinator will send patient Education on Smoking Cessation and GI AVM. RN Care Coordinator will place [facility] Social Work referral for other transportation options. RN Care Coordinator will make next telephone outreach to patient within the next 2 weeks and patient agrees to future outreach.   [employer]  RN Telephonic Care Coordinator [phone] [email or web address]

## 2020-10-26 NOTE — Telephone Encounter (Signed)
If he needs that due to his toe I would have him call the podiatrist. I gave limited supply to get him to his appt to Dr Lorin Mercy.

## 2020-10-26 NOTE — Telephone Encounter (Signed)
Sarah with [facility] called and stated that she follows up with patient and today patient was requesting a refill on his Tramadol. Stated that he only received #20 on 12/17 and needs a refill.  Stated since he has broken his toe and is having to wear a boot.   Pended Rx and and sent to [facility] for approval.

## 2020-10-26 NOTE — Telephone Encounter (Signed)
LMOM for patient to return call.

## 2020-10-26 NOTE — Patient Instructions (Addendum)
Goals Addressed            This Visit's Progress   . [facility]) Follow My Treatment Plan-Chronic Kidney       Timeframe:  Long-Range Goal Priority:  High Start Date:   10/26/20                          Expected End Date:  02/20/21                     Follow Up Date 11/09/20   - ask for help if I can't afford my medicines - avoid negative self-talk - call for medicine refill 2 or 3 days before it runs out - call the doctor or nurse before I stop taking medicine - call the doctor or nurse to get help with side effects - keep follow-up appointments - keep taking my medicines, even when I feel good  -attend scheduled hemodialysis treatments   Why is this important?    Staying as healthy as you can is very important. This may mean making changes if you smoke, don't exercise or eat poorly.   A healthy lifestyle is an important goal for you.   Following the treatment plan and making changes may be hard.   Try some of these steps to help keep the disease from getting worse.     Notes:     . [facility]) Learn More About My Health       Timeframe:  Short-Term Goal Priority:  Medium Start Date:   10/26/20                          Expected End Date:  11/23/20                      Follow Up Date 11/09/20   - tell my story and reason for my visit - make a list of questions - ask questions - repeat what I heard to make sure I understand - bring a list of my medicines to the visit - speak up when I don't understand    Why is this important?    The best way to learn about your health and care is by talking to the doctor and nurse.   They will answer your questions and give you information in the way that you like best.    Notes:     . [facility]) Make and Keep All Appointments       Timeframe:  Long-Range Goal Priority:  High Start Date:  10/26/20                           Expected End Date:  02/20/21                     Follow Up Date 11/09/20    - arrange a ride through an agency 1 week before  appointment - ask family or friend for a ride - call to cancel if needed - keep a calendar with appointment dates  -schedule post hospital follow up appointment with PCP   Why is this important?    Part of staying healthy is seeing the doctor for follow-up care.   If you forget your appointments, there are some things you can do to stay on track.    Notes:  Steps to Quit Smoking Smoking tobacco is the leading cause of preventable death. It can affect almost every organ in the body. Smoking puts you and people around you at risk for many serious, long-lasting (chronic) diseases. Quitting smoking can be hard, but it is one of the best things that you can do for your health. It is never too late to quit. How do I get ready to quit? When you decide to quit smoking, make a plan to help you succeed. Before you quit:  Pick a date to quit. Set a date within the next 2 weeks to give you time to prepare.  Write down the reasons why you are quitting. Keep this list in places where you will see it often.  Tell your family, friends, and co-workers that you are quitting. Their support is important.  Talk with your doctor about the choices that may help you quit.  Find out if your health insurance will pay for these treatments.  Know the people, places, things, and activities that make you want to smoke (triggers). Avoid them. What first steps can I take to quit smoking?  Throw away all cigarettes at home, at work, and in your car.  Throw away the things that you use when you smoke, such as ashtrays and lighters.  Clean your car. Make sure to empty the ashtray.  Clean your home, including curtains and carpets. What can I do to help me quit smoking? Talk with your doctor about taking medicines and seeing a counselor at the same time. You are more likely to succeed when you do both.  If you are pregnant or breastfeeding, talk with your doctor about counseling or other ways to quit  smoking. Do not take medicine to help you quit smoking unless your doctor tells you to do so. To quit smoking: Quit right away  Quit smoking totally, instead of slowly cutting back on how much you smoke over a period of time.  Go to counseling. You are more likely to quit if you go to counseling sessions regularly. Take medicine You may take medicines to help you quit. Some medicines need a prescription, and some you can buy over-the-counter. Some medicines may contain a drug called nicotine to replace the nicotine in cigarettes. Medicines may:  Help you to stop having the desire to smoke (cravings).  Help to stop the problems that come when you stop smoking (withdrawal symptoms). Your doctor may ask you to use:  Nicotine patches, gum, or lozenges.  Nicotine inhalers or sprays.  Non-nicotine medicine that is taken by mouth. Find resources Find resources and other ways to help you quit smoking and remain smoke-free after you quit. These resources are most helpful when you use them often. They include:  Online chats with a Social worker.  Phone quitlines.  Printed Furniture conservator/restorer.  Support groups or group counseling.  Text messaging programs.  Mobile phone apps. Use apps on your mobile phone or tablet that can help you stick to your quit plan. There are many free apps for mobile phones and tablets as well as websites. Examples include Quit Guide from the [employer] and smokefree.gov  What things can I do to make it easier to quit?   Talk to your family and friends. Ask them to support and encourage you.  Call a phone quitline (1-800-QUIT-NOW), reach out to support groups, or work with a Social worker.  Ask people who smoke to not smoke around you.  Avoid places that make you want to smoke,  such as: ? Bars. ? Parties. ? Smoke-break areas at work.  Spend time with people who do not smoke.  Lower the stress in your life. Stress can make you want to smoke. Try these things to help  your stress: ? Getting regular exercise. ? Doing deep-breathing exercises. ? Doing yoga. ? Meditating. ? Doing a body scan. To do this, close your eyes, focus on one area of your body at a time from head to toe. Notice which parts of your body are tense. Try to relax the muscles in those areas. How will I feel when I quit smoking? Day 1 to 3 weeks Within the first 24 hours, you may start to have some problems that come from quitting tobacco. These problems are very bad 2-3 days after you quit, but they do not often last for more than 2-3 weeks. You may get these symptoms:  Mood swings.  Feeling restless, nervous, angry, or annoyed.  Trouble concentrating.  Dizziness.  Strong desire for high-sugar foods and nicotine.  Weight gain.  Trouble pooping (constipation).  Feeling like you may vomit (nausea).  Coughing or a sore throat.  Changes in how the medicines that you take for other issues work in your body.  Depression.  Trouble sleeping (insomnia). Week 3 and afterward After the first 2-3 weeks of quitting, you may start to notice more positive results, such as:  Better sense of smell and taste.  Less coughing and sore throat.  Slower heart rate.  Lower blood pressure.  Clearer skin.  Better breathing.  Fewer sick days. Quitting smoking can be hard. Do not give up if you fail the first time. Some people need to try a few times before they succeed. Do your best to stick to your quit plan, and talk with your doctor if you have any questions or concerns. Summary  Smoking tobacco is the leading cause of preventable death. Quitting smoking can be hard, but it is one of the best things that you can do for your health.  When you decide to quit smoking, make a plan to help you succeed.  Quit smoking right away, not slowly over a period of time.  When you start quitting, seek help from your doctor, family, or friends. This information is not intended to replace advice  given to you by your health care provider. Make sure you discuss any questions you have with your health care provider. Document Revised: 07/05/2019 Document Reviewed: 12/29/2018 Elsevier Patient Education  [place].  Gastrointestinal Arteriovenous Malformations  A gastrointestinal arteriovenous malformation is a rare defect of tangled veins and arteries in the intestine. This defect can occur anywhere in the intestine. The defect creates an abnormal collection of blood vessels that can put pressure on other parts of your body that are close by. It causes blood vessels to expand (dilate) over time and sometimes bleed. Many people with this defect never have any symptoms. This defect may be present at birth (congenital). What are the causes? The cause of this condition is not known. It may be related to genetic causes or other factors. What are the signs or symptoms? Signs and symptoms often do not appear until you are older and may include:  Blood in your stool.  Black or dark red stools.  Weakness.  Tiredness.  Shortness of breath.  Iron deficiency anemia. How is this diagnosed? This condition is diagnosed based on a physical exam and a medical history. Imaging tests may also be done, such as:  X-ray.  CT scan.  Angiogram. This is a procedure used to examine the blood vessels. In this procedure, contrast dye is injected through a thin tube (catheter) into an artery. X-rays are then taken, which show if there is a blockage or problem in a blood vessel.  Endoscopy. In this procedure, your health care provider passes a thin, flexible tube (endoscope) through your mouth and down your esophagus into your stomach. A small camera is attached to the end of the tube. Images from the camera appear on a monitor in the exam room.  Small bowel enteroscopy. This is a procedure that uses a long thin scope to visualize areas in the small bowel.  Capsule endoscopy. In this procedure,  you will swallow a small capsule that has a tiny camera and transmitter in it. This capsule naturally travels through your digestive system. The camera takes photos of the small intestine along the way. How is this treated? The goal of treatment is to stop blood loss and prevent any future bleeding. Treatment may include:  Procedures to stop the bleeding or to destroy the affected vessels.  Abdominal surgery to remove the affected section of the digestive system.  Blood transfusion to replace blood loss.  Iron supplements for iron deficiency anemia. Follow these instructions at home:  Take over-the-counter and prescription medicines only as told by your health care provider. This includes iron supplements, if you are told to use them.  Keep all follow-up visits as told by your health care provider. This is important. Contact a health care provider if:  You have streaks of blood in your stool for a long time.  Your stool is black or deep red.  You have signs of anemia such as weakness, fatigue, or shortness of breath.  You have new symptoms. Get help right away if:  You have heavy bleeding during a bowel movement. Summary  Gastrointestinal arteriovenous malformation refers to an abnormal collection of blood vessels that can put pressure on other parts of your body that are close by. It causes blood vessels to dilate over time and sometimes bleed.  Many people with this defect never have any symptoms.  The goal of treatment is to stop blood loss and prevent any future bleeding. This information is not intended to replace advice given to you by your health care provider. Make sure you discuss any questions you have with your health care provider. Document Revised: 09/22/2017 Document Reviewed: 12/07/2016 Elsevier Patient Education  [place].

## 2020-10-27 ENCOUNTER — Encounter: Payer: Self-pay | Admitting: *Deleted

## 2020-10-27 ENCOUNTER — Telehealth: Payer: Self-pay | Admitting: Podiatry

## 2020-10-27 ENCOUNTER — Ambulatory Visit: Payer: Medicare Other | Admitting: *Deleted

## 2020-10-27 ENCOUNTER — Telehealth: Payer: Self-pay | Admitting: Nurse Practitioner

## 2020-10-27 NOTE — Telephone Encounter (Signed)
I can not. I already did this once, he needs to be seen before this can be prescribed.

## 2020-10-27 NOTE — Telephone Encounter (Signed)
Patient called to inform Jacob [name] that he has a pending appointment with foot specialist on 11/10/2020 and questions if she will give him enough tramadol to get him through til that appointment.  Please advise

## 2020-10-27 NOTE — Telephone Encounter (Signed)
The patient wanted to know if he can wear his surgical shoe. He fell while wearing the boot and its too heavy for him.

## 2020-10-27 NOTE — Addendum Note (Signed)
Addended by: Hubert Azure D on: 10/27/2020 09:36 AM   Modules accepted: Orders

## 2020-10-27 NOTE — Telephone Encounter (Signed)
Yes - that would be fine

## 2020-10-27 NOTE — Telephone Encounter (Signed)
Heraclio called and wants for Jacob [name] to call him back when she gets a chance at [phone].

## 2020-10-28 ENCOUNTER — Other Ambulatory Visit: Payer: Self-pay | Admitting: Orthopaedic Surgery

## 2020-10-28 ENCOUNTER — Telehealth: Payer: Self-pay

## 2020-10-28 DIAGNOSIS — M545 Low back pain, unspecified: Secondary | ICD-10-CM

## 2020-10-28 DIAGNOSIS — D631 Anemia in chronic kidney disease: Secondary | ICD-10-CM | POA: Diagnosis not present

## 2020-10-28 DIAGNOSIS — N2581 Secondary hyperparathyroidism of renal origin: Secondary | ICD-10-CM | POA: Diagnosis not present

## 2020-10-28 DIAGNOSIS — G8929 Other chronic pain: Secondary | ICD-10-CM

## 2020-10-28 DIAGNOSIS — D509 Iron deficiency anemia, unspecified: Secondary | ICD-10-CM | POA: Diagnosis not present

## 2020-10-28 DIAGNOSIS — E162 Hypoglycemia, unspecified: Secondary | ICD-10-CM | POA: Diagnosis not present

## 2020-10-28 DIAGNOSIS — N186 End stage renal disease: Secondary | ICD-10-CM | POA: Diagnosis not present

## 2020-10-28 DIAGNOSIS — E1122 Type 2 diabetes mellitus with diabetic chronic kidney disease: Secondary | ICD-10-CM | POA: Diagnosis not present

## 2020-10-28 DIAGNOSIS — L299 Pruritus, unspecified: Secondary | ICD-10-CM | POA: Diagnosis not present

## 2020-10-28 DIAGNOSIS — Z992 Dependence on renal dialysis: Secondary | ICD-10-CM | POA: Diagnosis not present

## 2020-10-28 MED ORDER — TRAMADOL HCL 50 MG PO TABS
50.0000 mg | ORAL_TABLET | Freq: Two times a day (BID) | ORAL | 0 refills | Status: DC | PRN
Start: 2020-10-28 — End: 2020-11-05

## 2020-10-28 NOTE — Telephone Encounter (Signed)
Left message on voicemail for patient to return call when available   

## 2020-10-28 NOTE — Telephone Encounter (Signed)
patient called he is requesting rx for pain until he decides to go through with surgery. [phone]

## 2020-10-28 NOTE — Telephone Encounter (Signed)
Order entered

## 2020-10-28 NOTE — Telephone Encounter (Signed)
Patient notified and agreed.  

## 2020-10-28 NOTE — Addendum Note (Signed)
Addended by: Meyer Cory on: 10/28/2020 04:57 PM   Modules accepted: Orders

## 2020-10-28 NOTE — Telephone Encounter (Signed)
I called the patient and let him know.

## 2020-10-28 NOTE — Telephone Encounter (Signed)
Please advise 

## 2020-10-28 NOTE — Telephone Encounter (Signed)
I called. Pain worse , problems walking and standing , needs MRI no contrast needed Lumbar MRI previous decompression L4-5 ,with recurrent claudication symptoms,      ROV after scan , thank you. I sent in ultram # 30 tabs to his pharmacy.

## 2020-10-30 DIAGNOSIS — L299 Pruritus, unspecified: Secondary | ICD-10-CM | POA: Diagnosis not present

## 2020-10-30 DIAGNOSIS — N186 End stage renal disease: Secondary | ICD-10-CM | POA: Diagnosis not present

## 2020-10-30 DIAGNOSIS — Z992 Dependence on renal dialysis: Secondary | ICD-10-CM | POA: Diagnosis not present

## 2020-10-30 DIAGNOSIS — D631 Anemia in chronic kidney disease: Secondary | ICD-10-CM | POA: Diagnosis not present

## 2020-10-30 DIAGNOSIS — D509 Iron deficiency anemia, unspecified: Secondary | ICD-10-CM | POA: Diagnosis not present

## 2020-10-30 DIAGNOSIS — N2581 Secondary hyperparathyroidism of renal origin: Secondary | ICD-10-CM | POA: Diagnosis not present

## 2020-10-30 DIAGNOSIS — E1122 Type 2 diabetes mellitus with diabetic chronic kidney disease: Secondary | ICD-10-CM | POA: Diagnosis not present

## 2020-10-30 DIAGNOSIS — E162 Hypoglycemia, unspecified: Secondary | ICD-10-CM | POA: Diagnosis not present

## 2020-11-02 DIAGNOSIS — E162 Hypoglycemia, unspecified: Secondary | ICD-10-CM | POA: Diagnosis not present

## 2020-11-02 DIAGNOSIS — N2581 Secondary hyperparathyroidism of renal origin: Secondary | ICD-10-CM | POA: Diagnosis not present

## 2020-11-02 DIAGNOSIS — L299 Pruritus, unspecified: Secondary | ICD-10-CM | POA: Diagnosis not present

## 2020-11-02 DIAGNOSIS — N186 End stage renal disease: Secondary | ICD-10-CM | POA: Diagnosis not present

## 2020-11-02 DIAGNOSIS — E1122 Type 2 diabetes mellitus with diabetic chronic kidney disease: Secondary | ICD-10-CM | POA: Diagnosis not present

## 2020-11-02 DIAGNOSIS — D509 Iron deficiency anemia, unspecified: Secondary | ICD-10-CM | POA: Diagnosis not present

## 2020-11-02 DIAGNOSIS — Z992 Dependence on renal dialysis: Secondary | ICD-10-CM | POA: Diagnosis not present

## 2020-11-02 DIAGNOSIS — D631 Anemia in chronic kidney disease: Secondary | ICD-10-CM | POA: Diagnosis not present

## 2020-11-03 ENCOUNTER — Other Ambulatory Visit: Payer: Self-pay | Admitting: Orthopaedic Surgery

## 2020-11-03 ENCOUNTER — Encounter: Payer: Self-pay | Admitting: *Deleted

## 2020-11-03 ENCOUNTER — Other Ambulatory Visit: Payer: Self-pay | Admitting: *Deleted

## 2020-11-03 ENCOUNTER — Telehealth: Payer: Self-pay | Admitting: Orthopaedic Surgery

## 2020-11-03 NOTE — Telephone Encounter (Signed)
Patient called advised he injured his left hip and want to know if he can take the Tramadol every 8 hours for pain? The number to contact patient is [phone]

## 2020-11-03 NOTE — Telephone Encounter (Signed)
I called patient and advised. 

## 2020-11-03 NOTE — Telephone Encounter (Signed)
Yes he can , BUT , he will run out of medication if he takes it more often.

## 2020-11-03 NOTE — Patient Instructions (Addendum)
Goals Addressed            This Visit's Progress   . [facility]) Follow My Treatment Plan-Chronic Kidney   On track    Timeframe:  Long-Range Goal Priority:  High Start Date:   10/26/20                          Expected End Date:  02/20/21                     Follow Up Date 11/11/20   - ask for help if I can't afford my medicines - avoid negative self-talk - call for medicine refill 2 or 3 days before it runs out - call the doctor or nurse before I stop taking medicine - call the doctor or nurse to get help with side effects - keep follow-up appointments - keep taking my medicines, even when I feel good  -attend scheduled hemodialysis treatments   Why is this important?    Staying as healthy as you can is very important. This may mean making changes if you smoke, don't exercise or eat poorly.   A healthy lifestyle is an important goal for you.   Following the treatment plan and making changes may be hard.   Try some of these steps to help keep the disease from getting worse.     Notes:  11/03/20-patient reporting his main goal is to make sure to attend hemodialysis and not to miss any treatments    . [facility]) Learn More About My Health   On track    Timeframe:  Short-Term Goal Priority:  Medium Start Date:   10/26/20                          Expected End Date:  11/23/20                      Follow Up Date 11/11/20   - tell my story and reason for my visit - make a list of questions - ask questions - repeat what I heard to make sure I understand - bring a list of my medicines to the visit - speak up when I don't understand    Why is this important?    The best way to learn about your health and care is by talking to the doctor and nurse.   They will answer your questions and give you information in the way that you like best.    Notes:     . [facility]) Make and Keep All Appointments   Not on track    Timeframe:  Long-Range Goal Priority:  High Start Date:  10/26/20                            Expected End Date:  02/20/21                     Follow Up Date 11/11/20    - arrange a ride through an agency 1 week before appointment - ask family or friend for a ride - call to cancel if needed - keep a calendar with appointment dates  -schedule post hospital follow up appointment with PCP   Why is this important?    Part of staying healthy is seeing the doctor for follow-up care.   If you forget your appointments,  there are some things you can do to stay on track.    Notes:     . ([facility]) Prevent Falls and Injury       Timeframe:  Short-Term Goal Priority:  High Start Date:  11/03/20                           Expected End Date:  12/21/20                     Follow Up Date 11/11/20   - always use handrails on the stairs - keep my cell phone with me always - pick up clutter from the floors - use a cane or walker - use a nightlight in the bathroom  -discuss home health therapy with providers -change positions slowly   Why is this important?    Most falls happen when it is hard for you to walk safely. Your balance may be off because of an illness. You may have pain in your knees, hip or other joints.   You may be overly tired or taking medicines that make you sleepy. You may not be able to see or hear clearly.   Falls can lead to broken bones, bruises or other injuries.   There are things you can do to help prevent falling.     Notes:        Fall Prevention in the Home, Adult Falls can cause injuries and can happen to people of all ages. There are many things you can do to make your home safe and to help prevent falls. Ask for help when making these changes. What actions can I take to prevent falls? General Instructions  Use good lighting in all rooms. Replace any light bulbs that burn out.  Turn on the lights in dark areas. Use night-lights.  Keep items that you use often in easy-to-reach places. Lower the shelves around your home if needed.  Set up  your furniture so you have a clear path. Avoid moving your furniture around.  Do not have throw rugs or other things on the floor that can make you trip.  Avoid walking on wet floors.  If any of your floors are uneven, fix them.  Add color or contrast paint or tape to clearly mark and help you see: ? Grab bars or handrails. ? First and last steps of staircases. ? Where the edge of each step is.  If you use a stepladder: ? Make sure that it is fully opened. Do not climb a closed stepladder. ? Make sure the sides of the stepladder are locked in place. ? Ask someone to hold the stepladder while you use it.  Know where your pets are when moving through your home. What can I do in the bathroom?  Keep the floor dry. Clean up any water on the floor right away.  Remove soap buildup in the tub or shower.  Use nonskid mats or decals on the floor of the tub or shower.  Attach bath mats securely with double-sided, nonslip rug tape.  If you need to sit down in the shower, use a plastic, nonslip stool.  Install grab bars by the toilet and in the tub and shower. Do not use towel bars as grab bars.      What can I do in the bedroom?  Make sure that you have a light by your bed that is easy to reach.  Do not use  any sheets or blankets for your bed that hang to the floor.  Have a firm chair with side arms that you can use for support when you get dressed. What can I do in the kitchen?  Clean up any spills right away.  If you need to reach something above you, use a step stool with a grab bar.  Keep electrical cords out of the way.  Do not use floor polish or wax that makes floors slippery. What can I do with my stairs?  Do not leave any items on the stairs.  Make sure that you have a light switch at the top and the bottom of the stairs.  Make sure that there are handrails on both sides of the stairs. Fix handrails that are broken or loose.  Install nonslip stair treads on all  your stairs.  Avoid having throw rugs at the top or bottom of the stairs.  Choose a carpet that does not hide the edge of the steps on the stairs.  Check carpeting to make sure that it is firmly attached to the stairs. Fix carpet that is loose or worn. What can I do on the outside of my home?  Use bright outdoor lighting.  Fix the edges of walkways and driveways and fix any cracks.  Remove anything that might make you trip as you walk through a door, such as a raised step or threshold.  Trim any bushes or trees on paths to your home.  Check to see if handrails are loose or broken and that both sides of all steps have handrails.  Install guardrails along the edges of any raised decks and porches.  Clear paths of anything that can make you trip, such as tools or rocks.  Have leaves, snow, or ice cleared regularly.  Use sand or salt on paths during winter.  Clean up any spills in your garage right away. This includes grease or oil spills. What other actions can I take?  Wear shoes that: ? Have a low heel. Do not wear high heels. ? Have rubber bottoms. ? Feel good on your feet and fit well. ? Are closed at the toe. Do not wear open-toe sandals.  Use tools that help you move around if needed. These include: ? Canes. ? Walkers. ? Scooters. ? Crutches.  Review your medicines with your doctor. Some medicines can make you feel dizzy. This can increase your chance of falling. Ask your doctor what else you can do to help prevent falls. Where to find more information  Centers for Disease Control and Prevention, [employer]: http://www.wolf.info/  National Institute on Aging: http://kim-miller.com/ Contact a doctor if:  You are afraid of falling at home.  You feel weak, drowsy, or dizzy at home.  You fall at home. Summary  There are many simple things that you can do to make your home safe and to help prevent falls.  Ways to make your home safe include removing things that can make you trip  and installing grab bars in the bathroom.  Ask for help when making these changes in your home. This information is not intended to replace advice given to you by your health care provider. Make sure you discuss any questions you have with your health care provider. Document Revised: 05/13/2020 Document Reviewed: 05/13/2020 Elsevier Patient Education  [name].

## 2020-11-03 NOTE — Patient Outreach (Signed)
[place] [facility]) Care Management  11/03/2020  Jacob [name] April 11, 1951 [id number]   RN Telephonic Care Coordinator Outreach  Referral Date:  10/26/2020 Referral Source:  EMMI General Discharge Reason for Referral:  EMMI [place] Insurance:  [employer]   Outreach Attempt:  Successful telephone outreach to patient for follow up 24 hour nurse line and return message left by patient.  HIPAA verified with patient.  Patient reporting he had fall last pm, tripping over boot worn on right foot due to toe fracture and foot wound.  States he hit his head and fell on left leg.  EMS was called and patient elected no to go to emergency room.  Patient reports pain in left thigh and headache that is better with ice, heat, Tylenol, and Tramadol.  States he has been able to get up this morning, get to kitchen to make sandwich and move around the house.  Reports using cane this morning.  Discussed with patient using walker to help stabilize his gait and possible need for home health physical therapy for safety evaluation.  Patient stating he does not think he needs walker and just feels "boot is too heavy" and would like to speak with providers about home health therapy, did not want Care Coordinator to contact providers for possible home health referral at this time.  Fall precautions and preventions reviewed and discussed and again patient encouraged to use walker with ambulation.  Patient states blood sugar this morning was 190 and he feels he will be able to get up and move around more later today.  Does report he has callous and wound to right foot along with fracture to right little toe.  States he gets dressing changes to foot done by podiatry.  Again patient declines offer to assist getting home health therapy arranged stating he will go to podiatrist next week first and wants to speak with orthopedist.  Does state he is more comfortable with post op shoe instead of boot and states  he was told by podiatrist he could wear post op shoe while at home up through the house.  Appointments:  Again encouraged patient to schedule appointment with primary care provider.  Has scheduled appointment with podiatry on 11/10/2020.  Plan:  RN Care Coordinator will make next telephone outreach to patient within the next 10 business days and patient agrees to care plan and future outreach.   [place] Management  RN Telephonic Care Coordinator [phone] [email or web address]

## 2020-11-03 NOTE — Telephone Encounter (Signed)
Please advise 

## 2020-11-04 ENCOUNTER — Encounter (HOSPITAL_COMMUNITY): Payer: Self-pay

## 2020-11-04 ENCOUNTER — Other Ambulatory Visit: Payer: Self-pay | Admitting: *Deleted

## 2020-11-04 ENCOUNTER — Emergency Department (HOSPITAL_COMMUNITY): Payer: Medicare Other

## 2020-11-04 ENCOUNTER — Inpatient Hospital Stay (HOSPITAL_COMMUNITY)
Admission: EM | Admit: 2020-11-04 | Discharge: 2020-11-07 | DRG: 963 | Disposition: A | Discharge status: Home / Self Care | Payer: Medicare Other | Attending: Internal Medicine | Admitting: Internal Medicine

## 2020-11-04 DIAGNOSIS — K31819 Angiodysplasia of stomach and duodenum without bleeding: Secondary | ICD-10-CM | POA: Diagnosis present

## 2020-11-04 DIAGNOSIS — N2581 Secondary hyperparathyroidism of renal origin: Secondary | ICD-10-CM | POA: Diagnosis present

## 2020-11-04 DIAGNOSIS — I2510 Atherosclerotic heart disease of native coronary artery without angina pectoris: Secondary | ICD-10-CM

## 2020-11-04 DIAGNOSIS — E1122 Type 2 diabetes mellitus with diabetic chronic kidney disease: Secondary | ICD-10-CM | POA: Diagnosis not present

## 2020-11-04 DIAGNOSIS — I5020 Unspecified systolic (congestive) heart failure: Secondary | ICD-10-CM | POA: Diagnosis not present

## 2020-11-04 DIAGNOSIS — I998 Other disorder of circulatory system: Secondary | ICD-10-CM | POA: Diagnosis not present

## 2020-11-04 DIAGNOSIS — N186 End stage renal disease: Secondary | ICD-10-CM

## 2020-11-04 DIAGNOSIS — E1142 Type 2 diabetes mellitus with diabetic polyneuropathy: Secondary | ICD-10-CM

## 2020-11-04 DIAGNOSIS — Z833 Family history of diabetes mellitus: Secondary | ICD-10-CM

## 2020-11-04 DIAGNOSIS — K219 Gastro-esophageal reflux disease without esophagitis: Secondary | ICD-10-CM | POA: Diagnosis present

## 2020-11-04 DIAGNOSIS — S72115A Nondisplaced fracture of greater trochanter of left femur, initial encounter for closed fracture: Secondary | ICD-10-CM | POA: Diagnosis not present

## 2020-11-04 DIAGNOSIS — F17210 Nicotine dependence, cigarettes, uncomplicated: Secondary | ICD-10-CM | POA: Diagnosis not present

## 2020-11-04 DIAGNOSIS — I5043 Acute on chronic combined systolic (congestive) and diastolic (congestive) heart failure: Secondary | ICD-10-CM | POA: Diagnosis present

## 2020-11-04 DIAGNOSIS — B20 Human immunodeficiency virus [HIV] disease: Secondary | ICD-10-CM | POA: Diagnosis present

## 2020-11-04 DIAGNOSIS — W010XXA Fall on same level from slipping, tripping and stumbling without subsequent striking against object, initial encounter: Secondary | ICD-10-CM | POA: Diagnosis present

## 2020-11-04 DIAGNOSIS — D631 Anemia in chronic kidney disease: Secondary | ICD-10-CM | POA: Diagnosis present

## 2020-11-04 DIAGNOSIS — I255 Ischemic cardiomyopathy: Secondary | ICD-10-CM | POA: Diagnosis not present

## 2020-11-04 DIAGNOSIS — Z794 Long term (current) use of insulin: Secondary | ICD-10-CM

## 2020-11-04 DIAGNOSIS — S065X0A Traumatic subdural hemorrhage without loss of consciousness, initial encounter: Secondary | ICD-10-CM | POA: Diagnosis not present

## 2020-11-04 DIAGNOSIS — D509 Iron deficiency anemia, unspecified: Secondary | ICD-10-CM | POA: Diagnosis not present

## 2020-11-04 DIAGNOSIS — Z813 Family history of other psychoactive substance abuse and dependence: Secondary | ICD-10-CM

## 2020-11-04 DIAGNOSIS — E782 Mixed hyperlipidemia: Secondary | ICD-10-CM | POA: Diagnosis not present

## 2020-11-04 DIAGNOSIS — Y92009 Unspecified place in unspecified non-institutional (private) residence as the place of occurrence of the external cause: Secondary | ICD-10-CM | POA: Diagnosis not present

## 2020-11-04 DIAGNOSIS — Z87442 Personal history of urinary calculi: Secondary | ICD-10-CM | POA: Diagnosis not present

## 2020-11-04 DIAGNOSIS — S72102A Unspecified trochanteric fracture of left femur, initial encounter for closed fracture: Secondary | ICD-10-CM | POA: Diagnosis present

## 2020-11-04 DIAGNOSIS — Z6822 Body mass index (BMI) 22.0-22.9, adult: Secondary | ICD-10-CM

## 2020-11-04 DIAGNOSIS — E669 Obesity, unspecified: Secondary | ICD-10-CM | POA: Diagnosis present

## 2020-11-04 DIAGNOSIS — R351 Nocturia: Secondary | ICD-10-CM | POA: Diagnosis present

## 2020-11-04 DIAGNOSIS — E162 Hypoglycemia, unspecified: Secondary | ICD-10-CM | POA: Diagnosis not present

## 2020-11-04 DIAGNOSIS — Z8719 Personal history of other diseases of the digestive system: Secondary | ICD-10-CM

## 2020-11-04 DIAGNOSIS — J449 Chronic obstructive pulmonary disease, unspecified: Secondary | ICD-10-CM | POA: Diagnosis present

## 2020-11-04 DIAGNOSIS — S065XAA Traumatic subdural hemorrhage with loss of consciousness status unknown, initial encounter: Secondary | ICD-10-CM | POA: Diagnosis present

## 2020-11-04 DIAGNOSIS — Z992 Dependence on renal dialysis: Secondary | ICD-10-CM | POA: Diagnosis not present

## 2020-11-04 DIAGNOSIS — I132 Hypertensive heart and chronic kidney disease with heart failure and with stage 5 chronic kidney disease, or end stage renal disease: Secondary | ICD-10-CM | POA: Diagnosis not present

## 2020-11-04 DIAGNOSIS — N189 Chronic kidney disease, unspecified: Secondary | ICD-10-CM

## 2020-11-04 DIAGNOSIS — Z01818 Encounter for other preprocedural examination: Secondary | ICD-10-CM | POA: Diagnosis not present

## 2020-11-04 DIAGNOSIS — S065X9A Traumatic subdural hemorrhage with loss of consciousness of unspecified duration, initial encounter: Secondary | ICD-10-CM | POA: Diagnosis not present

## 2020-11-04 DIAGNOSIS — I739 Peripheral vascular disease, unspecified: Secondary | ICD-10-CM | POA: Diagnosis not present

## 2020-11-04 DIAGNOSIS — G8929 Other chronic pain: Secondary | ICD-10-CM | POA: Diagnosis not present

## 2020-11-04 DIAGNOSIS — Z743 Need for continuous supervision: Secondary | ICD-10-CM | POA: Diagnosis not present

## 2020-11-04 DIAGNOSIS — W19XXXA Unspecified fall, initial encounter: Secondary | ICD-10-CM

## 2020-11-04 DIAGNOSIS — S92351A Displaced fracture of fifth metatarsal bone, right foot, initial encounter for closed fracture: Secondary | ICD-10-CM | POA: Diagnosis not present

## 2020-11-04 DIAGNOSIS — Z8249 Family history of ischemic heart disease and other diseases of the circulatory system: Secondary | ICD-10-CM

## 2020-11-04 DIAGNOSIS — H409 Unspecified glaucoma: Secondary | ICD-10-CM | POA: Diagnosis present

## 2020-11-04 DIAGNOSIS — Z823 Family history of stroke: Secondary | ICD-10-CM

## 2020-11-04 DIAGNOSIS — G9389 Other specified disorders of brain: Secondary | ICD-10-CM | POA: Diagnosis not present

## 2020-11-04 DIAGNOSIS — R519 Headache, unspecified: Secondary | ICD-10-CM | POA: Diagnosis not present

## 2020-11-04 DIAGNOSIS — Z955 Presence of coronary angioplasty implant and graft: Secondary | ICD-10-CM

## 2020-11-04 DIAGNOSIS — S0990XA Unspecified injury of head, initial encounter: Secondary | ICD-10-CM | POA: Diagnosis not present

## 2020-11-04 DIAGNOSIS — Z20822 Contact with and (suspected) exposure to covid-19: Secondary | ICD-10-CM | POA: Diagnosis not present

## 2020-11-04 DIAGNOSIS — Z79899 Other long term (current) drug therapy: Secondary | ICD-10-CM

## 2020-11-04 DIAGNOSIS — S92301A Fracture of unspecified metatarsal bone(s), right foot, initial encounter for closed fracture: Secondary | ICD-10-CM | POA: Diagnosis present

## 2020-11-04 DIAGNOSIS — I517 Cardiomegaly: Secondary | ICD-10-CM | POA: Diagnosis not present

## 2020-11-04 DIAGNOSIS — Z820 Family history of epilepsy and other diseases of the nervous system: Secondary | ICD-10-CM

## 2020-11-04 DIAGNOSIS — I5042 Chronic combined systolic (congestive) and diastolic (congestive) heart failure: Secondary | ICD-10-CM | POA: Diagnosis present

## 2020-11-04 DIAGNOSIS — I252 Old myocardial infarction: Secondary | ICD-10-CM

## 2020-11-04 DIAGNOSIS — L299 Pruritus, unspecified: Secondary | ICD-10-CM | POA: Diagnosis not present

## 2020-11-04 DIAGNOSIS — Z7902 Long term (current) use of antithrombotics/antiplatelets: Secondary | ICD-10-CM

## 2020-11-04 DIAGNOSIS — Z888 Allergy status to other drugs, medicaments and biological substances status: Secondary | ICD-10-CM

## 2020-11-04 DIAGNOSIS — E871 Hypo-osmolality and hyponatremia: Secondary | ICD-10-CM | POA: Diagnosis not present

## 2020-11-04 DIAGNOSIS — F1010 Alcohol abuse, uncomplicated: Secondary | ICD-10-CM | POA: Diagnosis present

## 2020-11-04 DIAGNOSIS — Z043 Encounter for examination and observation following other accident: Secondary | ICD-10-CM | POA: Diagnosis not present

## 2020-11-04 DIAGNOSIS — G4489 Other headache syndrome: Secondary | ICD-10-CM | POA: Diagnosis not present

## 2020-11-04 DIAGNOSIS — M25519 Pain in unspecified shoulder: Secondary | ICD-10-CM | POA: Diagnosis not present

## 2020-11-04 DIAGNOSIS — Z951 Presence of aortocoronary bypass graft: Secondary | ICD-10-CM

## 2020-11-04 DIAGNOSIS — Z21 Asymptomatic human immunodeficiency virus [HIV] infection status: Secondary | ICD-10-CM | POA: Diagnosis present

## 2020-11-04 NOTE — ED Triage Notes (Signed)
Pt comes via [facility], fall on Monday, possibly hit head, headache that started yesterday, dialysis told him to get check out today, pt is on Bril[place]

## 2020-11-04 NOTE — Patient Outreach (Signed)
[facility]) Care Management  11/04/2020  ROBEN SCHLIEP [birth year] [id number]   Notified that member called triage with concern of soreness after fall and not being able to make it to dialysis due to pain.  Call placed to member, state he was able to make it to dialysis today.  His neck is still sore, denies pain in back/hip.  Contacted MD office regarding increasing Tramadol dose, advised that he is able to take every 8 hours as needed.  Inquired about potential need for assessment for PT, declines, does not feel he is at risk for another fall.  Denies any urgent concerns, encouraged to contact this care manager with questions.  Agrees to follow up within the next week as previously scheduled.  Valente David, [place], MSN [place] [phone]

## 2020-11-05 ENCOUNTER — Emergency Department (HOSPITAL_COMMUNITY): Payer: Medicare Other

## 2020-11-05 ENCOUNTER — Encounter (HOSPITAL_COMMUNITY): Payer: Self-pay | Admitting: Internal Medicine

## 2020-11-05 ENCOUNTER — Other Ambulatory Visit: Payer: Self-pay

## 2020-11-05 ENCOUNTER — Ambulatory Visit: Payer: Self-pay | Admitting: *Deleted

## 2020-11-05 DIAGNOSIS — Y92009 Unspecified place in unspecified non-institutional (private) residence as the place of occurrence of the external cause: Secondary | ICD-10-CM

## 2020-11-05 DIAGNOSIS — K219 Gastro-esophageal reflux disease without esophagitis: Secondary | ICD-10-CM | POA: Diagnosis not present

## 2020-11-05 DIAGNOSIS — F1010 Alcohol abuse, uncomplicated: Secondary | ICD-10-CM | POA: Diagnosis not present

## 2020-11-05 DIAGNOSIS — Z794 Long term (current) use of insulin: Secondary | ICD-10-CM

## 2020-11-05 DIAGNOSIS — S92301A Fracture of unspecified metatarsal bone(s), right foot, initial encounter for closed fracture: Secondary | ICD-10-CM | POA: Diagnosis present

## 2020-11-05 DIAGNOSIS — F17210 Nicotine dependence, cigarettes, uncomplicated: Secondary | ICD-10-CM

## 2020-11-05 DIAGNOSIS — S72115A Nondisplaced fracture of greater trochanter of left femur, initial encounter for closed fracture: Secondary | ICD-10-CM | POA: Diagnosis not present

## 2020-11-05 DIAGNOSIS — I5042 Chronic combined systolic (congestive) and diastolic (congestive) heart failure: Secondary | ICD-10-CM

## 2020-11-05 DIAGNOSIS — N189 Chronic kidney disease, unspecified: Secondary | ICD-10-CM

## 2020-11-05 DIAGNOSIS — E782 Mixed hyperlipidemia: Secondary | ICD-10-CM

## 2020-11-05 DIAGNOSIS — S72102A Unspecified trochanteric fracture of left femur, initial encounter for closed fracture: Secondary | ICD-10-CM

## 2020-11-05 DIAGNOSIS — D631 Anemia in chronic kidney disease: Secondary | ICD-10-CM

## 2020-11-05 DIAGNOSIS — W19XXXA Unspecified fall, initial encounter: Secondary | ICD-10-CM

## 2020-11-05 DIAGNOSIS — E1142 Type 2 diabetes mellitus with diabetic polyneuropathy: Secondary | ICD-10-CM

## 2020-11-05 DIAGNOSIS — I2510 Atherosclerotic heart disease of native coronary artery without angina pectoris: Secondary | ICD-10-CM

## 2020-11-05 DIAGNOSIS — S065X9A Traumatic subdural hemorrhage with loss of consciousness of unspecified duration, initial encounter: Secondary | ICD-10-CM | POA: Diagnosis present

## 2020-11-05 DIAGNOSIS — S065XAA Traumatic subdural hemorrhage with loss of consciousness status unknown, initial encounter: Secondary | ICD-10-CM | POA: Diagnosis present

## 2020-11-05 LAB — CBC WITH DIFFERENTIAL/PLATELET
Abs Immature Granulocytes: 0.02 10*3/uL (ref 0.00–0.07)
Basophils Absolute: 0 10*3/uL (ref 0.0–0.1)
Basophils Relative: 1 %
Eosinophils Absolute: 0.1 10*3/uL (ref 0.0–0.5)
Eosinophils Relative: 1 %
HCT: 33.6 % — ABNORMAL LOW (ref 39.0–52.0)
Hemoglobin: 10.4 g/dL — ABNORMAL LOW (ref 13.0–17.0)
Immature Granulocytes: 0 %
Lymphocytes Relative: 13 %
Lymphs Abs: 1 10*3/uL (ref 0.7–4.0)
MCH: 31.9 pg (ref 26.0–34.0)
MCHC: 31 g/dL (ref 30.0–36.0)
MCV: 103.1 fL — ABNORMAL HIGH (ref 80.0–100.0)
Monocytes Absolute: 1 10*3/uL (ref 0.1–1.0)
Monocytes Relative: 12 %
Neutro Abs: 5.7 10*3/uL (ref 1.7–7.7)
Neutrophils Relative %: 73 %
Platelets: 164 10*3/uL (ref 150–400)
RBC: 3.26 MIL/uL — ABNORMAL LOW (ref 4.22–5.81)
RDW: 16.9 % — ABNORMAL HIGH (ref 11.5–15.5)
WBC: 7.9 10*3/uL (ref 4.0–10.5)
nRBC: 0 % (ref 0.0–0.2)

## 2020-11-05 LAB — CBG MONITORING, ED: Glucose-Capillary: 134 mg/dL — ABNORMAL HIGH (ref 70–99)

## 2020-11-05 LAB — GLUCOSE, CAPILLARY
Glucose-Capillary: 128 mg/dL — ABNORMAL HIGH (ref 70–99)
Glucose-Capillary: 138 mg/dL — ABNORMAL HIGH (ref 70–99)
Glucose-Capillary: 175 mg/dL — ABNORMAL HIGH (ref 70–99)

## 2020-11-05 LAB — BASIC METABOLIC PANEL
Anion gap: 14 (ref 5–15)
BUN: 31 mg/dL — ABNORMAL HIGH (ref 8–23)
CO2: 28 mmol/L (ref 22–32)
Calcium: 7.9 mg/dL — ABNORMAL LOW (ref 8.9–10.3)
Chloride: 92 mmol/L — ABNORMAL LOW (ref 98–111)
Creatinine, Ser: 5.05 mg/dL — ABNORMAL HIGH (ref 0.61–1.24)
GFR, Estimated: 12 mL/min — ABNORMAL LOW (ref >60)
Glucose, Bld: 147 mg/dL — ABNORMAL HIGH (ref 70–99)
Potassium: 4.1 mmol/L (ref 3.5–5.1)
Sodium: 134 mmol/L — ABNORMAL LOW (ref 135–145)

## 2020-11-05 LAB — HEMOGLOBIN A1C
Hgb A1c MFr Bld: 4.9 % (ref 4.8–5.6)
Mean Plasma Glucose: 93.93 mg/dL

## 2020-11-05 LAB — TYPE AND SCREEN
ABO/RH(D): A POS
Antibody Screen: NEGATIVE

## 2020-11-05 LAB — SARS CORONAVIRUS 2 (TAT 6-24 HRS): SARS Coronavirus 2: NEGATIVE

## 2020-11-05 LAB — PROTIME-INR
INR: 1.1 (ref 0.8–1.2)
Prothrombin Time: 13.8 seconds (ref 11.4–15.2)

## 2020-11-05 MED ORDER — ONDANSETRON HCL 4 MG/2ML IJ SOLN: 4.0000 mg | Freq: Four times a day (QID) | INTRAMUSCULAR | Status: DC | PRN

## 2020-11-05 MED ORDER — LORAZEPAM 1 MG PO TABS: 1.0000 mg | ORAL_TABLET | ORAL | Status: DC | PRN

## 2020-11-05 MED ORDER — TIMOLOL MALEATE 0.5 % OP SOLN
1.0000 [drp] | OPHTHALMIC | Status: DC
  Administered 2020-11-05 – 2020-11-07 (×3): 1 [drp] via OPHTHALMIC
  Filled 2020-11-05: qty 5

## 2020-11-05 MED ORDER — NITROGLYCERIN 0.4 MG SL SUBL: 0.4000 mg | SUBLINGUAL_TABLET | SUBLINGUAL | Status: DC | PRN

## 2020-11-05 MED ORDER — CHLORHEXIDINE GLUCONATE CLOTH 2 % EX PADS
6.0000 | MEDICATED_PAD | Freq: Every day | CUTANEOUS | Status: DC
  Administered 2020-11-06 – 2020-11-07 (×2): 6 via TOPICAL

## 2020-11-05 MED ORDER — CYCLOBENZAPRINE HCL 5 MG PO TABS
5.0000 mg | ORAL_TABLET | Freq: Three times a day (TID) | ORAL | Status: DC | PRN
  Administered 2020-11-05 – 2020-11-07 (×3): 5 mg via ORAL
  Filled 2020-11-05 (×4): qty 1

## 2020-11-05 MED ORDER — CARVEDILOL 3.125 MG PO TABS
3.1250 mg | ORAL_TABLET | Freq: Two times a day (BID) | ORAL | Status: DC
  Administered 2020-11-05 – 2020-11-07 (×4): 3.125 mg via ORAL
  Filled 2020-11-05 (×5): qty 1

## 2020-11-05 MED ORDER — BICTEGRAVIR-EMTRICITAB-TENOFOV 50-200-25 MG PO TABS
1.0000 | ORAL_TABLET | Freq: Every day | ORAL | Status: DC
  Administered 2020-11-05 – 2020-11-07 (×3): 1 via ORAL
  Filled 2020-11-05 (×3): qty 1

## 2020-11-05 MED ORDER — ACETAMINOPHEN 650 MG RE SUPP: 650.0000 mg | Freq: Four times a day (QID) | RECTAL | Status: DC | PRN

## 2020-11-05 MED ORDER — MORPHINE SULFATE (PF) 4 MG/ML IV SOLN: 4.0000 mg | INTRAVENOUS | Status: DC | PRN

## 2020-11-05 MED ORDER — POLYETHYLENE GLYCOL 3350 17 G PO PACK: 17.0000 g | PACK | Freq: Every day | ORAL | Status: DC | PRN

## 2020-11-05 MED ORDER — THIAMINE HCL 100 MG PO TABS
100.0000 mg | ORAL_TABLET | Freq: Every day | ORAL | Status: DC
  Administered 2020-11-05 – 2020-11-07 (×3): 100 mg via ORAL
  Filled 2020-11-05 (×3): qty 1

## 2020-11-05 MED ORDER — PANTOPRAZOLE SODIUM 40 MG PO TBEC
40.0000 mg | DELAYED_RELEASE_TABLET | Freq: Two times a day (BID) | ORAL | Status: DC
  Administered 2020-11-05 – 2020-11-07 (×5): 40 mg via ORAL
  Filled 2020-11-05 (×5): qty 1

## 2020-11-05 MED ORDER — GABAPENTIN 300 MG PO CAPS
300.0000 mg | ORAL_CAPSULE | Freq: Two times a day (BID) | ORAL | Status: DC
  Administered 2020-11-05 – 2020-11-07 (×5): 300 mg via ORAL
  Filled 2020-11-05 (×5): qty 1

## 2020-11-05 MED ORDER — HYDROMORPHONE HCL 1 MG/ML IJ SOLN: 0.5000 mg | INTRAMUSCULAR | Status: DC | PRN

## 2020-11-05 MED ORDER — OXYCODONE-ACETAMINOPHEN 5-325 MG PO TABS
1.0000 | ORAL_TABLET | ORAL | Status: DC | PRN
  Administered 2020-11-05 – 2020-11-07 (×6): 1 via ORAL
  Filled 2020-11-05 (×6): qty 1

## 2020-11-05 MED ORDER — ISOSORBIDE MONONITRATE ER 30 MG PO TB24
30.0000 mg | ORAL_TABLET | Freq: Every day | ORAL | Status: DC
  Administered 2020-11-05 – 2020-11-07 (×2): 30 mg via ORAL
  Filled 2020-11-05 (×4): qty 1

## 2020-11-05 MED ORDER — ALLOPURINOL 100 MG PO TABS
100.0000 mg | ORAL_TABLET | Freq: Every day | ORAL | Status: DC
  Administered 2020-11-05 – 2020-11-07 (×3): 100 mg via ORAL
  Filled 2020-11-05 (×4): qty 1

## 2020-11-05 MED ORDER — ADULT MULTIVITAMIN W/MINERALS CH
1.0000 | ORAL_TABLET | Freq: Every day | ORAL | Status: DC
  Administered 2020-11-05 – 2020-11-07 (×3): 1 via ORAL
  Filled 2020-11-05 (×3): qty 1

## 2020-11-05 MED ORDER — LORAZEPAM 2 MG/ML IJ SOLN: 1.0000 mg | INTRAMUSCULAR | Status: DC | PRN

## 2020-11-05 MED ORDER — LATANOPROST 0.005 % OP SOLN
1.0000 [drp] | Freq: Every day | OPHTHALMIC | Status: DC
  Administered 2020-11-05 – 2020-11-06 (×2): 1 [drp] via OPHTHALMIC
  Filled 2020-11-05 (×2): qty 2.5

## 2020-11-05 MED ORDER — THIAMINE HCL 100 MG/ML IJ SOLN: 100.0000 mg | Freq: Every day | INTRAMUSCULAR | Status: DC

## 2020-11-05 MED ORDER — FOLIC ACID 1 MG PO TABS
1.0000 mg | ORAL_TABLET | Freq: Every day | ORAL | Status: DC
  Administered 2020-11-05 – 2020-11-07 (×3): 1 mg via ORAL
  Filled 2020-11-05 (×3): qty 1

## 2020-11-05 MED ORDER — NITROGLYCERIN 0.3 MG SL SUBL
0.3000 mg | SUBLINGUAL_TABLET | SUBLINGUAL | Status: DC | PRN
  Filled 2020-11-05: qty 100

## 2020-11-05 MED ORDER — ONDANSETRON HCL 4 MG PO TABS: 4.0000 mg | ORAL_TABLET | Freq: Four times a day (QID) | ORAL | Status: DC | PRN

## 2020-11-05 MED ORDER — SEVELAMER CARBONATE 800 MG PO TABS
800.0000 mg | ORAL_TABLET | Freq: Three times a day (TID) | ORAL | Status: DC
  Administered 2020-11-05 – 2020-11-07 (×7): 800 mg via ORAL
  Filled 2020-11-05 (×7): qty 1

## 2020-11-05 MED ORDER — UMECLIDINIUM-VILANTEROL 62.5-25 MCG/INH IN AEPB
1.0000 | INHALATION_SPRAY | Freq: Every day | RESPIRATORY_TRACT | Status: DC
  Administered 2020-11-05 – 2020-11-07 (×3): 1 via RESPIRATORY_TRACT
  Filled 2020-11-05: qty 14

## 2020-11-05 MED ORDER — ACETAMINOPHEN 325 MG PO TABS: 650.0000 mg | ORAL_TABLET | Freq: Four times a day (QID) | ORAL | Status: DC | PRN

## 2020-11-05 MED ORDER — INSULIN ASPART 100 UNIT/ML ~~LOC~~ SOLN
0.0000 [IU] | Freq: Three times a day (TID) | SUBCUTANEOUS | Status: DC
  Administered 2020-11-05 (×2): 2 [IU] via SUBCUTANEOUS
  Administered 2020-11-05: 3 [IU] via SUBCUTANEOUS
  Administered 2020-11-05: 2 [IU] via SUBCUTANEOUS
  Administered 2020-11-06: 3 [IU] via SUBCUTANEOUS
  Administered 2020-11-06: 2 [IU] via SUBCUTANEOUS
  Administered 2020-11-06 – 2020-11-07 (×3): 3 [IU] via SUBCUTANEOUS

## 2020-11-05 NOTE — H&P (Signed)
History and Physical    [name] VQQ:[id number] DOB: 1950/11/08 DOA: 11/04/2020  PCP: [name], NP  Patient coming from: Home via EMS   Chief Complaint:  Chief Complaint  Patient presents with  . Headache     HPI:    70 year old male with past medical history of HIV, end-stage renal disease (HD M,W,F), coronary artery disease (S/P 2 vessel CABG 2004, Cath with DES 01/3328), systolic and diastolic congestive heart failure (Echo 09/2020 EF 40-45%), insulin-dependent diabetes mellitus type 2, alcohol abuse, hypertension, hyperlipidemia, history of multiple GI bleeds due to gastric AVMs (last in late dec 2021) who presents to [facility] Emergency Department via EMS due to complaints of severe headache.  Patient explains that 2 days ago (Tuesday) patient tripped over his own cam boot (on his right foot for 5th metatarsal fracture) resulting in a fall, striking his head on the floor.  At that time, patient experienced a severe headache as well as left hip pain.  Patient has since experienced worsening pain with attempts to ambulate.  The following day patient went to his dialysis session and due to his complaints of continued severe headache throughout the dialysis session was instructed by the dialysis nurse seek a medical evaluation since his recent fall.  Since the patient's fall, the patient's left hip pain has progressively worsened.  Patient's left hip pain is now severe in intensity, sharp in quality, worse with weightbearing and radiating distally down the leg with movement of the affected extremity or weightbearing.  Patient eventually contacted EMS with complaints who promptly brought the patient into [facility] emergency department for evaluation.  Upon evaluation in the emergency department, patient was found to have what appeared to be a small subdural hematoma at the falx which was reviewed by the neurosurgery APP.  They placed in the note in the chart  stating that since the patient was having his first CT nearly 48 hours after the fall the patient did not read a repeat CT scan unless there was a neurologic change.  Patient was also found to have a nondisplaced fracture involving the greater trochanter of the left femur.  The ER provider discussed this with Dr. Durward Fortes with orthopedic surgery who stated that he would communicate this development to the patient's orthopedic surgeon Dr. Lorin Mercy who would come see the patient in the morning.  The hospitalist group was then called to assess the patient for admission to the hospital.  Review of Systems:   Review of Systems  Musculoskeletal: Positive for falls and joint pain.  Neurological: Positive for headaches.  All other systems reviewed and are negative.   Past Medical History:  Diagnosis Date  . Acute respiratory failure ([place]) 03/01/2018  . Arthritis    "all over; mostly knees and back" (02/28/2018)  . Chronic lower back pain    stenosis  . Community acquired pneumonia 09/06/2013  . COPD (chronic obstructive pulmonary disease) ([place])   . Coronary atherosclerosis of native coronary artery    a. 02/2003 s/p CABG x 2 (VG->RI, VG->RPDA; b. 11/2019 PCI: LM nl, LAD 90d, D3 50, RI 100, LCX 100p, OM3 100 - fills via L->L collats from D2/dLAD, RCA 100p, VG->RPDA ok, VG->RI 95 (3.5x48 Synergy XD DES).  . Drug abuse ([place])    hx; tested for cocaine as recently as 2/08. says he is not using drugs now - avoided defib. for this reason   . ESRD (end stage renal disease) ([place])    Hemo M-W-F- Richarda Blade  .  GERD (gastroesophageal reflux disease)    takes OTC meds as needed  . GI bleeding    a. 11/2019 EGD: angiodysplastic lesions w/ bleeding s/p argon plasma/clipping/epi inj.  . Glaucoma    uses eye drops daily  . Hepatitis B 1968   "tx'd w/isolation; caught it from toilet stools in gym"  . HFrEF (heart failure with reduced ejection fraction) ([place])    a. 01/2019 Echo: EF 40-45%, diffuse HK, mild basal septal  hypertrophy.  . History of blood transfusion 03/01/2019  . History of colon polyps    benign  . History of gout    takes Allopurinol daily as well as Colchicine-if needed (02/28/2018)  . History of kidney stones   . HTN (hypertension)    takes Coreg,Imdur.and Apresoline daily  . Human immunodeficiency virus (HIV) disease ([place]) dx'd 1995   takes Genvoya daily  . Hyperlipidemia    takes Atorvastatin daily  . Ischemic cardiomyopathy    a. 01/2019 Echo: EF 40-45%, diffuse HK, mild basal septal hypertrophy. Diast dysfxn. Nl RV size/fxn. Sev dil LA. Triv MR/TR/PR.  [name] Muscle spasm    takes Zanaflex as needed  . Myocardial infarction ([place]) ~ 2004/2005  . Nocturia   . Peripheral neuropathy    takes gabapentin daily  . Pneumonia    "at least twice" (02/28/2018)  . Shortness of breath dyspnea    rarely but if notices it then with exertion  . Syphilis, unspecified   . Type II diabetes mellitus ([place]) 2004   Lantus daily.Average fasting blood sugar 125-199  . Wears glasses   . Wears partial dentures     Past Surgical History:  Procedure Laterality Date  . AV FISTULA PLACEMENT Left 08/02/2018   Procedure: ARTERIOVENOUS (AV) FISTULA CREATION  left arm radiocephlic;  Surgeon: Marty Heck, MD;  Location: [place];  Service: Vascular;  Laterality: Left;  . AV FISTULA PLACEMENT Left 08/01/2019   Procedure: LEFT BRACHIOCEPHALIC ARTERIOVENOUS (AV) FISTULA CREATION;  Surgeon: [name], MD;  Location: [place];  Service: Vascular;  Laterality: Left;  . BASCILIC VEIN TRANSPOSITION Left 10/03/2019   Procedure: BASILIC VEIN TRANSPOSITION LEFT SECOND STAGE;  Surgeon: [name], MD;  Location: [place];  Service: Vascular;  Laterality: Left;  . CARDIAC CATHETERIZATION  10/2002; 12/19/2004   [name] 03/08/2011  . COLONOSCOPY  2013   Dr.John Henrene Pastor   . CORONARY ARTERY BYPASS GRAFT  02/24/2003   CABG X2/notes 03/08/2011  . CORONARY STENT INTERVENTION N/A 12/19/2019   Procedure: CORONARY STENT  INTERVENTION;  Surgeon: Jettie Booze, MD;  Location: [place] CV LAB;  Service: Cardiovascular;  Laterality: N/A;  . ESOPHAGOGASTRODUODENOSCOPY (EGD) WITH PROPOFOL N/A 02/08/2019   Procedure: ESOPHAGOGASTRODUODENOSCOPY (EGD) WITH PROPOFOL;  Surgeon: Milus Banister, MD;  Location: [place];  Service: Gastroenterology;  Laterality: N/A;  . ESOPHAGOGASTRODUODENOSCOPY (EGD) WITH PROPOFOL N/A 12/22/2019   Procedure: ESOPHAGOGASTRODUODENOSCOPY (EGD) WITH PROPOFOL;  Surgeon: Lavena Bullion, DO;  Location: [place];  Service: Gastroenterology;  Laterality: N/A;  . ESOPHAGOGASTRODUODENOSCOPY (EGD) WITH PROPOFOL N/A 10/19/2020   Procedure: ESOPHAGOGASTRODUODENOSCOPY (EGD) WITH PROPOFOL;  Surgeon: Jackquline Denmark, MD;  Location[facility] ENDOSCOPY;  Service: Endoscopy;  Laterality: N/A;  . HEMOSTASIS CLIP PLACEMENT  12/22/2019   Procedure: HEMOSTASIS CLIP PLACEMENT;  Surgeon: Lavena Bullion, DO;  Location: [place];  Service: Gastroenterology;;  . HOT HEMOSTASIS N/A 02/08/2019   Procedure: HOT HEMOSTASIS (ARGON PLASMA COAGULATION/BICAP);  Surgeon: Milus Banister, MD;  Location: [facility] ENDOSCOPY;  Service: Gastroenterology;  Laterality: N/A;  .  HOT HEMOSTASIS N/A 12/22/2019   Procedure: HOT HEMOSTASIS (ARGON PLASMA COAGULATION/BICAP);  Surgeon: Lavena Bullion, DO;  Location: [facility] ENDOSCOPY;  Service: Gastroenterology;  Laterality: N/A;  . HOT HEMOSTASIS N/A 10/19/2020   Procedure: HOT HEMOSTASIS (ARGON PLASMA COAGULATION/BICAP);  Surgeon: Jackquline Denmark, MD;  Location: [facility] ENDOSCOPY;  Service: Endoscopy;  Laterality: N/A;  . INTERTROCHANTERIC HIP FRACTURE SURGERY Left 11/2006   [name] 03/08/2011  . INTRAVASCULAR ULTRASOUND/IVUS N/A 12/19/2019   Procedure: Intravascular Ultrasound/IVUS;  Surgeon: Jettie Booze, MD;  Location: [place] CV LAB;  Service: Cardiovascular;  Laterality: N/A;  . IR FLUORO GUIDE CV LINE RIGHT  07/24/2019  . IR FLUORO GUIDE CV LINE RIGHT  07/30/2019  . IR US  GUIDE VASC ACCESS RIGHT  07/24/2019  . IR US GUIDE VASC ACCESS RIGHT  07/30/2019  . LAPAROSCOPIC CHOLECYSTECTOMY  05/2006  . LIGATION OF COMPETING BRANCHES OF ARTERIOVENOUS FISTULA Left 11/05/2018   Procedure: LIGATION OF COMPETING BRANCHES OF ARTERIOVENOUS FISTULA  LEFT  ARM;  Surgeon: Marty Heck, MD;  Location: [place];  Service: Vascular;  Laterality: Left;  . LUMBAR LAMINECTOMY/DECOMPRESSION MICRODISCECTOMY N/A 02/29/2016   Procedure: Left L4-5 Lateral Recess Decompression, Removal Extradural Intraspinal Facet Cyst;  Surgeon: Marybelle Killings, MD;  Location: [place];  Service: Orthopedics;  Laterality: N/A;  . MULTIPLE TOOTH EXTRACTIONS    . ORIF MANDIBULAR FRACTURE Left 08/13/2004   ORIF of left body fracture mandible with KLS Martin 2.3-mm six hole/notes 03/08/2011  . RIGHT/LEFT HEART CATH AND CORONARY/GRAFT ANGIOGRAPHY N/A 12/19/2019   Procedure: RIGHT/LEFT HEART CATH AND CORONARY/GRAFT ANGIOGRAPHY;  Surgeon: Jettie Booze, MD;  Location: [place] CV LAB;  Service: Cardiovascular;  Laterality: N/A;  . SCLEROTHERAPY  12/22/2019   Procedure: SCLEROTHERAPY;  Surgeon: Lavena Bullion, DO;  Location: [place] ENDOSCOPY;  Service: Gastroenterology;;     reports that he has been smoking cigarettes. He has a 21.50 pack-year smoking history. He has never used smokeless tobacco. He reports current alcohol use of about 12.0 standard drinks of alcohol per week. He reports that he does not use drugs.  Allergies  Allergen Reactions  . Augmentin [Amoxicillin-Pot Clavulanate] Diarrhea    Severe  . Amphetamines Other (See Comments)    Unknown reaction type    Family History  Problem Relation Age of Onset  . Heart failure Father   . Hypertension Father   . Diabetes Brother   . Heart attack Brother   . Alzheimer's disease Mother   . Stroke Sister   . Diabetes Sister   . Alzheimer's disease Sister   . Hypertension Brother   . Diabetes Brother   . Drug abuse Brother   . Colon cancer Neg Hx       Prior to Admission medications   Medication Sig Start Date End Date Taking? Authorizing Provider  acetaminophen (TYLENOL) 650 MG CR tablet Take 650 mg by mouth every 8 (eight) hours as needed for pain.    [provider]  allopurinol (ZYLOPRIM) 100 MG tablet TAKE 1 TABLET(100 MG) BY MOUTH DAILY Patient taking differently: Take 100 mg by mouth daily. TAKE 1 TABLET(100 MG) BY MOUTH DAILY 07/23/20   [name], NP  ANORO ELLIPTA 62.5-25 MCG/INH AEPB INHALE 1 PUFF BY MOUTH EVERY DAY Patient taking differently: Inhale 1 puff into the lungs daily. 12/11/19   [name], Dinah C, NP  aspirin 81 MG chewable tablet Chew 1 tablet (81 mg total) by mouth daily. Patient not taking: Reported on 10/26/2020 10/19/20   Pahwani, Michell Heinrich,  MD  B-D UF III MINI PEN NEEDLES 31G X 5 MM MISC USE FOUR TIMES DAILY 03/02/20   [name], NP  bictegravir-emtricitabine-tenofovir AF (BIKTARVY) 50-200-25 MG TABS tablet Take 1 tablet by mouth daily. 06/30/20   [name], MD  carvedilol (COREG) 3.125 MG tablet Take 1 tablet (3.125 mg total) by mouth 2 (two) times daily. 02/13/20   [name], MD  Cinacalcet HCl (SENSIPAR PO) Take by mouth.  08/14/20 08/13/21  [provider]  colchicine 0.6 MG tablet Take 0.6 mg by mouth daily as needed (pain).     [provider]  diclofenac Sodium (VOLTAREN) 1 % GEL APPLY 2 GRAMS TOPICALLY TO THE AFFECTED AREA THREE TIMES DAILY AS NEEDED FOR PAIN Patient taking differently: Apply 2 g topically 3 (three) times daily as needed (pain). 08/17/20   [name], NP  gabapentin (NEURONTIN) 300 MG capsule Take 1 capsule (300 mg total) by mouth 2 (two) times daily. 09/22/20   [name], Dinah C, NP  insulin lispro (HUMALOG KWIKPEN) 100 UNIT/ML KwikPen Inject 0.15ml (20 Units total) into the skin 3 (three) times daily as needed for high blood sugar (above 150) 03/12/20   Dewaine Oats, [name], NP  iron sucrose in sodium chloride 0.9 % 100 mL Iron Sucrose  (Venofer) 07/08/20 07/07/21  [provider]  isosorbide mononitrate (IMDUR) 30 MG 24 hr tablet TAKE 1 TABLET BY MOUTH EVERY DAY Patient taking differently: Take 30 mg by mouth daily. TAKE 1 TABLET BY MOUTH EVERY DAY 08/10/20   [name], NP  Lancets [facility] DELICA PLUS [id number]) MISC Inject 1 Device as directed 3 (three) times daily. Dx: E11.40 09/18/19   [name], NP  latanoprost (XALATAN) 0.005 % ophthalmic solution Place 1 drop into both eyes at bedtime.    [provider]  Methoxy PEG-Epoetin Beta (MIRCERA IJ) Mircera 07/06/20   [provider]  multivitamin (RENA-VIT) TABS tablet Take 1 tablet by mouth daily.    [provider]  nitroGLYCERIN (NITROSTAT) 0.3 MG SL tablet ONE TABLET UNDER TONGUE AS NEEDED FOR CHEST PAIN Patient taking differently: Place 0.3 mg under the tongue every 5 (five) minutes as needed for chest pain. 05/01/19   [name], MD  Nutritional Supplements (VITAMIN D BOOSTER PO) Take by mouth. 08/12/20 08/11/21  [provider]  ONETOUCH ULTRA test strip CHECK BLOOD SUGAR 3 TIMES  DAILY 05/27/20   [name], NP  pantoprazole (PROTONIX) 40 MG tablet TAKE 1 TABLET(40 MG) BY MOUTH TWICE DAILY 10/19/20   Fayrene Helper, MD  sevelamer carbonate (RENVELA) 800 MG tablet Take 800 mg by mouth 3 (three) times daily with meals.    [provider]  ticagrelor (BRILINTA) 90 MG TABS tablet Take by mouth 2 (two) times daily.    [provider]  timolol (TIMOPTIC) 0.5 % ophthalmic solution Place 1 drop into both eyes every morning. 04/04/19   [provider]  traMADol (ULTRAM) 50 MG tablet Take 1 tablet (50 mg total) by mouth every 12 (twelve) hours as needed. 10/09/20   [name], NP  traMADol (ULTRAM) 50 MG tablet Take 1 tablet (50 mg total) by mouth every 12 (twelve) hours as needed. 10/28/20   Marybelle Killings, MD    Physical Exam: Vitals:   11/05/20 0430 11/05/20 0500  11/05/20 0530 11/05/20 0600  BP: (!) 152/64 (!) 152/73 (!) 160/68 (!) 155/63  Pulse: 96 93 93 94  Resp: 15 13 14 15   Temp:  TempSrc:      SpO2: 100% 100% 96% 96%    Constitutional: Patient is lethargic but arousable and oriented x3, no associated distress.   Skin: no rashes, no lesions, poor skin turgor noted. Eyes: Pupils are equally reactive to light.  No evidence of scleral icterus or conjunctival pallor.  ENMT: Moist mucous membranes noted.  Posterior pharynx clear of any exudate or lesions.   Neck: normal, supple, no masses, no thyromegaly.  No evidence of jugular venous distension.   Respiratory: clear to auscultation bilaterally, no wheezing, no crackles. Normal respiratory effort. No accessory muscle use.  Cardiovascular: Regular rate and rhythm, no murmurs / rubs / gallops. No extremity edema. 2+ pedal pulses. No carotid bruits.  Chest:   Nontender without crepitus or deformity.   Back:   Nontender without crepitus or deformity. Abdomen: Abdomen is soft and nontender.  No evidence of intra-abdominal masses.  Positive bowel sounds noted in all quadrants.   Musculoskeletal: Notable pain upon palpation of the left hip.  Additional pain with both passive and active range of motion of the left hip as well.  no contractures. Normal muscle tone.  Neurologic: CN 2-12 grossly intact. Sensation intact.  Patient moving all 4 extremities spontaneously.  Patient is following all commands.  Patient is responsive to verbal stimuli.  Psychiatric: Patient exhibits angry mood with appropriate affect.  Patient seems to possess insight as to their current situation.     Labs on Admission: I have personally reviewed following labs and imaging studies -   CBC: Recent Labs  Lab 11/05/20 0212  WBC 7.9  NEUTROABS 5.7  HGB 10.4*  HCT 33.6*  MCV 103.1*  PLT 161   Basic Metabolic Panel: Recent Labs  Lab 11/05/20 0212  NA 134*  K 4.1  CL 92*  CO2 28  GLUCOSE 147*  BUN 31*  CREATININE  5.05*  CALCIUM 7.9*   GFR: CrCl cannot be calculated (Unknown ideal weight.). Liver Function Tests: No results for input(s): AST, ALT, ALKPHOS, BILITOT, PROT, ALBUMIN in the last 168 hours. No results for input(s): LIPASE, AMYLASE in the last 168 hours. No results for input(s): AMMONIA in the last 168 hours. Coagulation Profile: Recent Labs  Lab 11/05/20 0212  INR 1.1   Cardiac Enzymes: No results for input(s): CKTOTAL, CKMB, CKMBINDEX, TROPONINI in the last 168 hours. BNP (last 3 results) No results for input(s): PROBNP in the last 8760 hours. HbA1C: No results for input(s): HGBA1C in the last 72 hours. CBG: No results for input(s): GLUCAP in the last 168 hours. Lipid Profile: No results for input(s): CHOL, HDL, LDLCALC, TRIG, CHOLHDL, LDLDIRECT in the last 72 hours. Thyroid Function Tests: No results for input(s): TSH, T4TOTAL, FREET4, T3FREE, THYROIDAB in the last 72 hours. Anemia Panel: No results for input(s): VITAMINB12, FOLATE, FERRITIN, TIBC, IRON, RETICCTPCT in the last 72 hours. Urine analysis:    Component Value Date/Time   COLORURINE YELLOW 10/16/2020 2136   APPEARANCEUR CLEAR 10/16/2020 2136   LABSPEC 1.016 10/16/2020 2136   PHURINE 5.0 10/16/2020 2136   GLUCOSEU NEGATIVE 10/16/2020 2136   GLUCOSEU NEG mg/dL 09/20/2010 2058   HGBUR SMALL (A) 10/16/2020 2136   HGBUR negative 05/31/2010 0948   BILIRUBINUR NEGATIVE 10/16/2020 2136   [place] NEGATIVE 10/16/2020 2136   PROTEINUR 30 (A) 10/16/2020 2136   UROBILINOGEN 0.2 06/24/2015 2336   NITRITE NEGATIVE 10/16/2020 2136   LEUKOCYTESUR NEGATIVE 10/16/2020 2136    Radiological Exams on Admission - Personally Reviewed: CT Head Wo Contrast  Result Date:  11/05/2020 CLINICAL DATA:  Fall, posterior head strike EXAM: CT HEAD WITHOUT CONTRAST TECHNIQUE: Contiguous axial images were obtained from the base of the skull through the vertex without intravenous contrast. COMPARISON:  CT 10/21/2016 FINDINGS: Brain: There  is some new trace hyperdense thickening along the posterior falx (3/22) concerning for trace subdural blood. Finding is however on a background of some diffuse hyperattenuating mineralization along the falx and tentorium though the remainder of which appears stable from comparison. No additional sites concerning for acute intracranial hemorrhage. No other extra-axial collection is seen. No mass effect or midline shift. No CT evident areas of infarction. Patchy areas of white matter hypoattenuation are most compatible with chronic microvascular angiopathy. Symmetric prominence of the ventricles, cisterns and sulci compatible with parenchymal volume loss. Vascular: Atherosclerotic calcification of the carotid siphons and intradural vertebral arteries. No hyperdense vessel. Skull: There are chronic areas of right occipital scarring with new areas of soft tissue thickening along the posterior scalp. No subjacent calvarial fracture is seen. No other acute osseous abnormalities or worrisome osseous lesions. Sinuses/Orbits: Paranasal sinuses and mastoid air cells are predominantly clear. Included orbital structures are unremarkable. Other: None IMPRESSION: 1. New trace hyperdense thickening along the posterior falx (3/22) concerning for trace subdural blood in the setting of fall with head strike. Finding is however on a background of some diffuse hyperattenuating mineralization along the falx and tentorium though the remainder of which appears stable from comparison. Could consider short-term (6 hour) follow-up imaging to assess for stability. 2. Chronic areas of right occipital scarring with new areas of soft tissue thickening along the posterior scalp. No subjacent calvarial fracture. Correlate for point tenderness. 3. Chronic microvascular angiopathy and parenchymal volume loss. Intracranial atherosclerosis. Critical Value/emergent results were called by telephone at the time of interpretation on 11/05/2020 at 1:18 am  to provider [facility], who verbally acknowledged these results. Electronically Signed   By: Lovena Le M.D.   On: 11/05/2020 01:19   DG Chest Port 1 View  Result Date: 11/05/2020 CLINICAL DATA:  Preoperative examination EXAM: PORTABLE CHEST 1 VIEW COMPARISON:  10/16/2020 FINDINGS: In unchanged moderate cardiomegaly with calcific atherosclerosis. Multiple old right-sided rib fractures. Remote median sternotomy. No focal airspace consolidation or pulmonary edema. IMPRESSION: Unchanged moderate cardiomegaly without focal airspace disease. Electronically Signed   By: Ulyses Jarred M.D.   On: 11/05/2020 02:24   DG Hip Unilat W or Wo Pelvis 2-3 Views Left  Result Date: 11/04/2020 CLINICAL DATA:  Fall EXAM: DG HIP (WITH OR WITHOUT PELVIS) 2-3V LEFT COMPARISON:  CT 10/16/2020, 12/08/2015 FINDINGS: SI joints are non widened. Extensive vascular calcifications. Pubic symphysis and rami appear intact. Right femoral head projects in joint. Intramedullary rod and distal fixating screw within the left femur. Suspected acute nondisplaced fracture involving the greater trochanter of the femur. IMPRESSION: Suspected acute nondisplaced fracture involving the greater trochanter of the left femur. Electronically Signed   By: Donavan Foil M.D.   On: 11/04/2020 23:16    EKG: Personally reviewed.  Rhythm is normal sinus rhythm with heart rate of 86 bpm.  Interventricular conduction delay.  No dynamic ST segment changes appreciated.  Assessment/Plan Principal Problem:   Fall at home, initial encounter   Patient suffered a fall at home, seemingly mechanical in nature approximately 2 days prior to arrival  This is resulted in 2 main injuries:  A small subdural hematoma at the falx noted on CT imaging of the head which was reviewed by neurosurgery after speaking to the ER provider stating that  the patient needed no further work-up and no further imaging.  -We will likely need to coordinate with neurosurgery as to when  it would be safe to restart his home regimen of Brilinta however.  A fracture of the greater trochanter of the left femur -Case was discussed with Dr. Durward Fortes by the ER provider who will communicate this finding to Dr. Lorin Mercy who is the patient's usual orthopedist who will come by and evaluate the patient as a consult sometime on 1/13.  In the meantime, providing patient with as needed analgesics for associated pain.  I do not anticipate surgical intervention today and therefore patient has not been made NPO.  Holding all anticoagulation.  SCDs for DVT prophylaxis.  While fall was felt to be mechanical in nature we will monitor patient on telemetry  Active Problems: End-stage renal disease on hemodialysis   Patient is typically on a Monday Wednesday Friday hemodialysis schedule  Last hemodialysis was on 1/12  We will coordinate with nephrology during this hospitalization for resumption of dialysis services  Coronary artery disease of native heart without angina pectoris   Patient is currently chest pain-free  Monitoring patient on telemetry  Holding Brilinta for now until we can clear when it can be safely resumed with surgical services   Unspecified trochanteric fracture of left femur, initial encounter for closed fracture ([place])   Please see assessment and plan above    Traumatic subdural hematoma, initial encounter ([place])   Please see assessment and plan above    Human immunodeficiency virus (HIV) disease ([place])   Continue home regimen of HAART    Mixed hyperlipidemia   Continue home regimen of lipid-lowering therapy    Chronic combined systolic (congestive) and diastolic (congestive) heart failure (H[facility])   No evidence of cardiogenic volume overload at this time    Type 2 diabetes mellitus with diabetic polyneuropathy, with long-term current use of insulin ([place])   Will perform Accu-Cheks before every meal and nightly with sliding scale insulin for  now  Hemoglobin A1c pending    Nicotine dependence, cigarettes, uncomplicated   Counseling patient on cessation  Patient declining nicotine replacement therapy    Alcohol abuse   Monitoring for evidence of withdrawal via CIWA protocol  As needed benzodiazepines for evidence of withdrawal symptoms    Fracture of metatarsal of right foot, closed   Suffered several weeks ago, addressed by patient's podiatrist during 12/2 visit using a cam boot  Continue usage of cam boot once patient is ambulatory again with outpatient follow-up with podiatry    GERD without esophagitis    Continue home regimen of Protonix twice daily considering recent gastrointestinal bleeding due to multiple gastric AVMs   Code Status:  Full code Family Communication: Deferred  Status is: Observation  The patient remains OBS appropriate and will d/c before 2 midnights.  Dispo: The patient is from: Home              Anticipated d/c is to: Home              Anticipated d/c date is: 2 days              Patient currently is not medically stable to d/c.        Vernelle Emerald MD [facility] Pager [phone]  If 7PM-7AM, please contact night-coverage [email or web address] Use universal [place] password for that web site. If you do not have the password, please call the hospital operator.  11/05/2020, 7:53 AM

## 2020-11-05 NOTE — ED Provider Notes (Signed)
[facility] EMERGENCY DEPARTMENT Provider Note   CSN: [id number] Arrival date & time: 11/04/20  2239   History Chief Complaint  Patient presents with  . Headache    Jacob [name] is a 70 y.o. male.  The history is provided by the patient.  Headache He has history of hypertension, diabetes, hyperlipidemia, systolic heart failure, HIV disease, end-stage renal disease on hemodialysis and comes in after falling at home 2 days ago.  Since that fall, he has had pain in his left hip and has been unable to walk.  He also is complaining of a generalized headache - he thinks he hit his head when he fell, but is not sure.  Headache is moderate and he rates it at 4/10.  Hip pain is moderate to severe and he rates it at 7/10.  He denies nausea or vomiting.  He states he is generally weak but denies any focal weakness.  He has a walking boot on his right lower leg and he apparently tripped because of difficulty ambulating with a walking boot.  Past Medical History:  Diagnosis Date  . Acute respiratory failure ([place]) 03/01/2018  . Arthritis    "all over; mostly knees and back" (02/28/2018)  . Chronic lower back pain    stenosis  . Community acquired pneumonia 09/06/2013  . COPD (chronic obstructive pulmonary disease) ([place])   . Coronary atherosclerosis of native coronary artery    a. 02/2003 s/p CABG x 2 (VG->RI, VG->RPDA; b. 11/2019 PCI: LM nl, LAD 90d, D3 50, RI 100, LCX 100p, OM3 100 - fills via L->L collats from D2/dLAD, RCA 100p, VG->RPDA ok, VG->RI 95 (3.5x48 Synergy XD DES).  . Drug abuse ([place])    hx; tested for cocaine as recently as 2/08. says he is not using drugs now - avoided defib. for this reason   . ESRD (end stage renal disease) ([place])    Hemo M-W-F- Richarda Blade  . GERD (gastroesophageal reflux disease)    takes OTC meds as needed  . GI bleeding    a. 11/2019 EGD: angiodysplastic lesions w/ bleeding s/p argon plasma/clipping/epi inj.  . Glaucoma    uses eye drops daily   . Hepatitis B 1968   "tx'd w/isolation; caught it from toilet stools in gym"  . HFrEF (heart failure with reduced ejection fraction) ([place])    a. 01/2019 Echo: EF 40-45%, diffuse HK, mild basal septal hypertrophy.  . History of blood transfusion 03/01/2019  . History of colon polyps    benign  . History of gout    takes Allopurinol daily as well as Colchicine-if needed (02/28/2018)  . History of kidney stones   . HTN (hypertension)    takes Coreg,Imdur.and Apresoline daily  . Human immunodeficiency virus (HIV) disease ([place]) dx'd 1995   takes Genvoya daily  . Hyperlipidemia    takes Atorvastatin daily  . Ischemic cardiomyopathy    a. 01/2019 Echo: EF 40-45%, diffuse HK, mild basal septal hypertrophy. Diast dysfxn. Nl RV size/fxn. Sev dil LA. Triv MR/TR/PR.  [name] Muscle spasm    takes Zanaflex as needed  . Myocardial infarction ([place]) ~ 2004/2005  . Nocturia   . Peripheral neuropathy    takes gabapentin daily  . Pneumonia    "at least twice" (02/28/2018)  . Shortness of breath dyspnea    rarely but if notices it then with exertion  . Syphilis, unspecified   . Type II diabetes mellitus ([place]) 2004   Lantus daily.Average fasting blood sugar 125-199  .  Wears glasses   . Wears partial dentures     Patient Active Problem List   Diagnosis Date Noted  . Uremia   . Acute GI bleeding 10/17/2020  . AVM (arteriovenous malformation) of small bowel, acquired with hemorrhage   . Fluid overload 10/16/2020  . Cardiomyopathy ([place]) 12/21/2019  . Iron deficiency anemia   . Acute blood loss anemia   . Acute on chronic anemia   . AVM (arteriovenous malformation) of duodenum, acquired   . Abnormal nuclear stress test 12/19/2019  . ESRD (end stage renal disease) on dialysis ([place])   . Hand pain, left   . Cocaine abuse ([place])   . Hypertensive emergency   . CHF exacerbation ([place]) 07/21/2019  . Diabetic foot ulcer ([place]) 02/11/2019  . Gastric AVM   . Melena 02/07/2019  . CKD stage 4 due to type 2  diabetes mellitus ([place]) 05/30/2018  . Hyperlipidemia associated with type 2 diabetes mellitus ([place]) 05/30/2018  . Right foot ulcer ([place]) 02/28/2018  . Chronic obstructive pulmonary disease ([place]) 02/28/2018  . Anemia of chronic disease 11/20/2016  . CKD (chronic kidney disease) stage 3, GFR 30-59 ml/min (HCC) 11/20/2016  . CHF (congestive heart failure) ([place]) 11/10/2016  . History of lumbar laminectomy for spinal cord decompression 02/29/2016  . Type 2 diabetes mellitus with vascular disease ([place]) 06/17/2015  . HTN (hypertension) 04/26/2015  . DM (diabetes mellitus) ([place]) 04/26/2015  . Ischemic cardiomyopathy 05/12/2014  . Ulcer of lower extremity ([place]) 09/06/2013  . Hyperkalemia 09/06/2013  . Chest pain 09/06/2013  . Type II or unspecified type diabetes mellitus with unspecified complication, not stated as uncontrolled 10/22/2012  . Bunion of left foot 11/25/2011  . Bunion, right foot 11/25/2011  . Polysubstance abuse ([place]) 07/28/2011  . Hip fracture, left ([place]) 04/04/2011  . Closed fracture of neck of femur ([place]) 04/04/2011  . Insomnia 02/18/2011  . CAD (coronary artery disease) 04/20/2009  . Acute on chronic combined systolic (congestive) and diastolic (congestive) heart failure ([place]) 04/20/2009  . HLD (hyperlipidemia) 11/20/2006  . Gout 11/20/2006  . TOBACCO ABUSE 11/20/2006  . Essential hypertension 11/20/2006  . Human immunodeficiency virus (HIV) disease ([place]) 09/02/2006    Past Surgical History:  Procedure Laterality Date  . AV FISTULA PLACEMENT Left 08/02/2018   Procedure: ARTERIOVENOUS (AV) FISTULA CREATION  left arm radiocephlic;  Surgeon: Marty Heck, MD;  Location: [place];  Service: Vascular;  Laterality: Left;  . AV FISTULA PLACEMENT Left 08/01/2019   Procedure: LEFT BRACHIOCEPHALIC ARTERIOVENOUS (AV) FISTULA CREATION;  Surgeon: [name], MD;  Location: [place];  Service: Vascular;  Laterality: Left;  . BASCILIC VEIN TRANSPOSITION Left 10/03/2019    Procedure: BASILIC VEIN TRANSPOSITION LEFT SECOND STAGE;  Surgeon: [name], MD;  Location: [place];  Service: Vascular;  Laterality: Left;  . CARDIAC CATHETERIZATION  10/2002; 12/19/2004   [name] 03/08/2011  . COLONOSCOPY  2013   Dr.John Henrene Pastor   . CORONARY ARTERY BYPASS GRAFT  02/24/2003   CABG X2/notes 03/08/2011  . CORONARY STENT INTERVENTION N/A 12/19/2019   Procedure: CORONARY STENT INTERVENTION;  Surgeon: Jettie Booze, MD;  Location: [place] CV LAB;  Service: Cardiovascular;  Laterality: N/A;  . ESOPHAGOGASTRODUODENOSCOPY (EGD) WITH PROPOFOL N/A 02/08/2019   Procedure: ESOPHAGOGASTRODUODENOSCOPY (EGD) WITH PROPOFOL;  Surgeon: Milus Banister, MD;  Location: [place];  Service: Gastroenterology;  Laterality: N/A;  . ESOPHAGOGASTRODUODENOSCOPY (EGD) WITH PROPOFOL N/A 12/22/2019   Procedure: ESOPHAGOGASTRODUODENOSCOPY (EGD) WITH PROPOFOL;  Surgeon: Lavena Bullion, DO;  Location: [place];  Service: Gastroenterology;  Laterality: N/A;  . ESOPHAGOGASTRODUODENOSCOPY (EGD) WITH PROPOFOL N/A 10/19/2020   Procedure: ESOPHAGOGASTRODUODENOSCOPY (EGD) WITH PROPOFOL;  Surgeon: Jackquline Denmark, MD;  [facility];  Service: Endoscopy;  Laterality: N/A;  . HEMOSTASIS CLIP PLACEMENT  12/22/2019   Procedure: HEMOSTASIS CLIP PLACEMENT;  Surgeon: Lavena Bullion, DO;  Location: [place];  Service: Gastroenterology;;  . HOT HEMOSTASIS N/A 02/08/2019   Procedure: HOT HEMOSTASIS (ARGON PLASMA COAGULATION/BICAP);  Surgeon: Milus Banister, MD;  Location: [facility] ENDOSCOPY;  Service: Gastroenterology;  Laterality: N/A;  . HOT HEMOSTASIS N/A 12/22/2019   Procedure: HOT HEMOSTASIS (ARGON PLASMA COAGULATION/BICAP);  Surgeon: Lavena Bullion, DO;  Location: [facility] ENDOSCOPY;  Service: Gastroenterology;  Laterality: N/A;  . HOT HEMOSTASIS N/A 10/19/2020   Procedure: HOT HEMOSTASIS (ARGON PLASMA COAGULATION/BICAP);  Surgeon: Jackquline Denmark, MD;  Location: [facility] ENDOSCOPY;  Service: Endoscopy;   Laterality: N/A;  . INTERTROCHANTERIC HIP FRACTURE SURGERY Left 11/2006   [name] 03/08/2011  . INTRAVASCULAR ULTRASOUND/IVUS N/A 12/19/2019   Procedure: Intravascular Ultrasound/IVUS;  Surgeon: Jettie Booze, MD;  Location: [place] CV LAB;  Service: Cardiovascular;  Laterality: N/A;  . IR FLUORO GUIDE CV LINE RIGHT  07/24/2019  . IR FLUORO GUIDE CV LINE RIGHT  07/30/2019  . IR US GUIDE VASC ACCESS RIGHT  07/24/2019  . IR US GUIDE VASC ACCESS RIGHT  07/30/2019  . LAPAROSCOPIC CHOLECYSTECTOMY  05/2006  . LIGATION OF COMPETING BRANCHES OF ARTERIOVENOUS FISTULA Left 11/05/2018   Procedure: LIGATION OF COMPETING BRANCHES OF ARTERIOVENOUS FISTULA  LEFT  ARM;  Surgeon: Marty Heck, MD;  Location: [place];  Service: Vascular;  Laterality: Left;  . LUMBAR LAMINECTOMY/DECOMPRESSION MICRODISCECTOMY N/A 02/29/2016   Procedure: Left L4-5 Lateral Recess Decompression, Removal Extradural Intraspinal Facet Cyst;  Surgeon: Marybelle Killings, MD;  Location: [place];  Service: Orthopedics;  Laterality: N/A;  . MULTIPLE TOOTH EXTRACTIONS    . ORIF MANDIBULAR FRACTURE Left 08/13/2004   ORIF of left body fracture mandible with KLS Martin 2.3-mm six hole/notes 03/08/2011  . RIGHT/LEFT HEART CATH AND CORONARY/GRAFT ANGIOGRAPHY N/A 12/19/2019   Procedure: RIGHT/LEFT HEART CATH AND CORONARY/GRAFT ANGIOGRAPHY;  Surgeon: Jettie Booze, MD;  Location: [place] CV LAB;  Service: Cardiovascular;  Laterality: N/A;  . SCLEROTHERAPY  12/22/2019   Procedure: SCLEROTHERAPY;  Surgeon: Lavena Bullion, DO;  Location: [facility] ENDOSCOPY;  Service: Gastroenterology;;       Family History  Problem Relation Age of Onset  . Heart failure Father   . Hypertension Father   . Diabetes Brother   . Heart attack Brother   . Alzheimer's disease Mother   . Stroke Sister   . Diabetes Sister   . Alzheimer's disease Sister   . Hypertension Brother   . Diabetes Brother   . Drug abuse Brother   . Colon cancer Neg Hx      Social History   Tobacco Use  . Smoking status: Current Every Day Smoker    Packs/day: 0.50    Years: 43.00    Pack years: 21.50    Types: Cigarettes  . Smokeless tobacco: Never Used  Vaping Use  . Vaping Use: Never used  Substance Use Topics  . Alcohol use: Yes    Alcohol/week: 12.0 standard drinks    Types: 12 Standard drinks or equivalent per week    Comment: occ  . Drug use: No    Types: Cocaine    Comment: hx of crack/cocaine 47yrs ago 10/01/2019- none    Home Medications Prior to Admission medications  Medication Sig Start Date End Date Taking? Authorizing Provider  acetaminophen (TYLENOL) 650 MG CR tablet Take 650 mg by mouth every 8 (eight) hours as needed for pain.    [provider]  allopurinol (ZYLOPRIM) 100 MG tablet TAKE 1 TABLET(100 MG) BY MOUTH DAILY Patient taking differently: Take 100 mg by mouth daily. TAKE 1 TABLET(100 MG) BY MOUTH DAILY 07/23/20   [name], NP  ANORO ELLIPTA 62.5-25 MCG/INH AEPB INHALE 1 PUFF BY MOUTH EVERY DAY Patient taking differently: Inhale 1 puff into the lungs daily. 12/11/19   [name], Dinah C, NP  aspirin 81 MG chewable tablet Chew 1 tablet (81 mg total) by mouth daily. Patient not taking: Reported on 10/26/2020 10/19/20   Mckinley Jewel, MD  B-D UF III MINI PEN NEEDLES 31G X 5 MM MISC USE FOUR TIMES DAILY 03/02/20   [name], NP  bictegravir-emtricitabine-tenofovir AF (BIKTARVY) 50-200-25 MG TABS tablet Take 1 tablet by mouth daily. 06/30/20   [name], MD  carvedilol (COREG) 3.125 MG tablet Take 1 tablet (3.125 mg total) by mouth 2 (two) times daily. 02/13/20   [name], MD  Cinacalcet HCl (SENSIPAR PO) Take by mouth.  08/14/20 08/13/21  [provider]  colchicine 0.6 MG tablet Take 0.6 mg by mouth daily as needed (pain).     [provider]  diclofenac Sodium (VOLTAREN) 1 % GEL APPLY 2 GRAMS TOPICALLY TO THE AFFECTED AREA THREE TIMES DAILY AS NEEDED FOR PAIN Patient  taking differently: Apply 2 g topically 3 (three) times daily as needed (pain). 08/17/20   [name], NP  gabapentin (NEURONTIN) 300 MG capsule Take 1 capsule (300 mg total) by mouth 2 (two) times daily. 09/22/20   [name], Dinah C, NP  insulin lispro (HUMALOG KWIKPEN) 100 UNIT/ML KwikPen Inject 0.49ml (20 Units total) into the skin 3 (three) times daily as needed for high blood sugar (above 150) 03/12/20   Dewaine Oats, [name], NP  iron sucrose in sodium chloride 0.9 % 100 mL Iron Sucrose (Venofer) 07/08/20 07/07/21  [provider]  isosorbide mononitrate (IMDUR) 30 MG 24 hr tablet TAKE 1 TABLET BY MOUTH EVERY DAY Patient taking differently: Take 30 mg by mouth daily. TAKE 1 TABLET BY MOUTH EVERY DAY 08/10/20   [name], NP  Lancets [facility] DELICA PLUS [id number]) MISC Inject 1 Device as directed 3 (three) times daily. Dx: E11.40 09/18/19   [name], NP  latanoprost (XALATAN) 0.005 % ophthalmic solution Place 1 drop into both eyes at bedtime.    [provider]  Methoxy PEG-Epoetin Beta (MIRCERA IJ) Mircera 07/06/20   [provider]  multivitamin (RENA-VIT) TABS tablet Take 1 tablet by mouth daily.    [provider]  nitroGLYCERIN (NITROSTAT) 0.3 MG SL tablet ONE TABLET UNDER TONGUE AS NEEDED FOR CHEST PAIN Patient taking differently: Place 0.3 mg under the tongue every 5 (five) minutes as needed for chest pain. 05/01/19   [name], MD  Nutritional Supplements (VITAMIN D BOOSTER PO) Take by mouth. 08/12/20 08/11/21  [provider]  ONETOUCH ULTRA test strip CHECK BLOOD SUGAR 3 TIMES  DAILY 05/27/20   [name], NP  pantoprazole (PROTONIX) 40 MG tablet TAKE 1 TABLET(40 MG) BY MOUTH TWICE DAILY 10/19/20   Fayrene Helper, MD  sevelamer carbonate (RENVELA) 800 MG tablet Take 800 mg by mouth 3 (three) times daily with meals.    [provider]  ticagrelor (BRILINTA) 90 MG TABS tablet Take  by mouth 2 (two)  times daily.    [provider]  timolol (TIMOPTIC) 0.5 % ophthalmic solution Place 1 drop into both eyes every morning. 04/04/19   [provider]  traMADol (ULTRAM) 50 MG tablet Take 1 tablet (50 mg total) by mouth every 12 (twelve) hours as needed. 10/09/20   [name], NP  traMADol (ULTRAM) 50 MG tablet Take 1 tablet (50 mg total) by mouth every 12 (twelve) hours as needed. 10/28/20   Marybelle Killings, MD    Allergies    Augmentin [amoxicillin-pot clavulanate] and Amphetamines  Review of Systems   Review of Systems  Neurological: Positive for headaches.  All other systems reviewed and are negative.   Physical Exam Updated Vital Signs BP (!) 149/69   Pulse 84   Temp 97.9 F (36.6 C) (Oral)   Resp 12   SpO2 100%   Physical Exam Vitals and nursing note reviewed.   70 year old male, resting comfortably and in no acute distress. Vital signs are significant for elevated blood pressure. Oxygen saturation is 100%, which is normal. Head is normocephalic and atraumatic. PERRLA, EOMI. Oropharynx is clear. Neck is nontender and supple without adenopathy or JVD. Back is nontender and there is no CVA tenderness. Lungs are clear without rales, wheezes, or rhonchi. Chest is nontender. Heart has regular rate and rhythm without murmur. Abdomen is soft, flat, nontender without masses or hepatosplenomegaly and peristalsis is normoactive. Extremities: No shortening of his left leg and no rotation of left leg.  There is tenderness to palpation over the left hip and there is pain with any passive movement of the left hip.  There is no edema.  AV fistula is present in the left upper arm with thrill present.  Walking boot is present on the right foot. Skin is warm and dry without rash. Neurologic: Mental status is normal, cranial nerves are intact, there are no motor or sensory deficits.  ED Results / Procedures / Treatments   Labs (all labs ordered are listed, but only  abnormal results are displayed) Labs Reviewed  BASIC METABOLIC PANEL - Abnormal; Notable for the following components:      Result Value   Sodium 134 (*)    Chloride 92 (*)    Glucose, Bld 147 (*)    BUN 31 (*)    Creatinine, Ser 5.05 (*)    Calcium 7.9 (*)    GFR, Estimated 12 (*)    All other components within normal limits  CBC WITH DIFFERENTIAL/PLATELET - Abnormal; Notable for the following components:   RBC 3.26 (*)    Hemoglobin 10.4 (*)    HCT 33.6 (*)    MCV 103.1 (*)    RDW 16.9 (*)    All other components within normal limits  SARS CORONAVIRUS 2 (TAT 6-24 HRS)  PROTIME-INR  TYPE AND SCREEN    EKG EKG Interpretation  Date/Time:  Thursday November 05 2020 01:57:52 EST Ventricular Rate:  86 PR Interval:    QRS Duration: 129 QT Interval:  460 QTC Calculation: 551 R Axis:   4 Text Interpretation: Sinus rhythm Probable left atrial enlargement IVCD, consider atypical RBBB Consider inferior infarct Abnormal T, consider ischemia, lateral leads When compared with ECG of 10/17/2023, HEART RATE has decreased Confirmed by Delora Fuel ([id number]) on 11/05/2020 3:33:50 AM   Radiology CT Head Wo Contrast  Result Date: 11/05/2020 CLINICAL DATA:  Fall, posterior head strike EXAM: CT HEAD WITHOUT CONTRAST TECHNIQUE: Contiguous axial images were obtained from  the base of the skull through the vertex without intravenous contrast. COMPARISON:  CT 10/21/2016 FINDINGS: Brain: There is some new trace hyperdense thickening along the posterior falx (3/22) concerning for trace subdural blood. Finding is however on a background of some diffuse hyperattenuating mineralization along the falx and tentorium though the remainder of which appears stable from comparison. No additional sites concerning for acute intracranial hemorrhage. No other extra-axial collection is seen. No mass effect or midline shift. No CT evident areas of infarction. Patchy areas of white matter hypoattenuation are most compatible  with chronic microvascular angiopathy. Symmetric prominence of the ventricles, cisterns and sulci compatible with parenchymal volume loss. Vascular: Atherosclerotic calcification of the carotid siphons and intradural vertebral arteries. No hyperdense vessel. Skull: There are chronic areas of right occipital scarring with new areas of soft tissue thickening along the posterior scalp. No subjacent calvarial fracture is seen. No other acute osseous abnormalities or worrisome osseous lesions. Sinuses/Orbits: Paranasal sinuses and mastoid air cells are predominantly clear. Included orbital structures are unremarkable. Other: None IMPRESSION: 1. New trace hyperdense thickening along the posterior falx (3/22) concerning for trace subdural blood in the setting of fall with head strike. Finding is however on a background of some diffuse hyperattenuating mineralization along the falx and tentorium though the remainder of which appears stable from comparison. Could consider short-term (6 hour) follow-up imaging to assess for stability. 2. Chronic areas of right occipital scarring with new areas of soft tissue thickening along the posterior scalp. No subjacent calvarial fracture. Correlate for point tenderness. 3. Chronic microvascular angiopathy and parenchymal volume loss. Intracranial atherosclerosis. Critical Value/emergent results were called by telephone at the time of interpretation on 11/05/2020 at 1:18 am to provider [facility], who verbally acknowledged these results. Electronically Signed   By: Lovena Le M.D.   On: 11/05/2020 01:19   DG Chest Port 1 View  Result Date: 11/05/2020 CLINICAL DATA:  Preoperative examination EXAM: PORTABLE CHEST 1 VIEW COMPARISON:  10/16/2020 FINDINGS: In unchanged moderate cardiomegaly with calcific atherosclerosis. Multiple old right-sided rib fractures. Remote median sternotomy. No focal airspace consolidation or pulmonary edema. IMPRESSION: Unchanged moderate cardiomegaly without  focal airspace disease. Electronically Signed   By: Ulyses Jarred M.D.   On: 11/05/2020 02:24   DG Hip Unilat W or Wo Pelvis 2-3 Views Left  Result Date: 11/04/2020 CLINICAL DATA:  Fall EXAM: DG HIP (WITH OR WITHOUT PELVIS) 2-3V LEFT COMPARISON:  CT 10/16/2020, 12/08/2015 FINDINGS: SI joints are non widened. Extensive vascular calcifications. Pubic symphysis and rami appear intact. Right femoral head projects in joint. Intramedullary rod and distal fixating screw within the left femur. Suspected acute nondisplaced fracture involving the greater trochanter of the femur. IMPRESSION: Suspected acute nondisplaced fracture involving the greater trochanter of the left femur. Electronically Signed   By: Donavan Foil M.D.   On: 11/04/2020 23:16    Procedures Procedures   Medications Ordered in ED Medications  morphine 4 MG/ML injection 4 mg (has no administration in time range)    ED Course  I have reviewed the triage vital signs and the nursing notes.  Pertinent labs & imaging results that were available during my care of the patient were reviewed by me and considered in my medical decision making (see chart for details).  MDM Rules/Calculators/A&P Fall at home with headache and left hip pain.  X-rays obtained in triage show fixation rods from prior hip fracture, probable fracture of the greater trochanter.  CT of head shows possible trace subdural blood along the falx  cerebri.  Case has been discussed with Ms. Meyran, PA on call for neurosurgery who will review the images, but feels that since the fall was 2 days ago, no further imaging is likely to be needed.  Case is discussed with Dr. Durward Fortes, on-call for the patient's orthopedic surgeon, who request patient be admitted to medicine service and patient's orthopedic physician, Dr. Lorin Mercy, will see the patient and evaluate x-rays in the morning. Case is discussed with Dr. Cyd Silence of [facility], who agrees to admit the patient. Screening labs  show stable anemia, mild thrombocytopenia which is not felt to be clinically significant. Chest x-ray and ECG showed no acute changes.  Final Clinical Impression(s) / ED Diagnoses Final diagnoses:  Fall in home, initial encounter  Nondisplaced fracture of greater trochanter of left femur, initial encounter for closed fracture ([place])  Subdural hematoma ([place])  End-stage renal disease on hemodialysis ([place])  Anemia associated with chronic renal failure  Hyponatremia    Rx / DC Orders ED Discharge Orders    None       Delora Fuel, MD 86/57/84 [phone]

## 2020-11-05 NOTE — Progress Notes (Addendum)
PROGRESS NOTE        PATIENT DETAILS Name: Jacob [name] Age: 70 y.o. Sex: male Date of Birth: [birth year] Admit Date: 11/04/2020 Admitting Physician Vernelle Emerald, MD EXH:[id number], [name], NP  Brief Narrative: Patient is a 70 y.o. male with history of HIV, ESRD on HD MWF, CAD s/p CABG 2004-s/p DES 89/3810, chronic systolic heart failure, DM-2, HTN, HLD, multiple GI bleeds due to gastric AVMs, EtOH use-who presented to the ED with a history of fall approximately 2 days prior to this hospitalization-and ongoing headache and left hip pain.  Upon further evaluation-found to have a small subdural hematoma, and left greater trochanter fracture.  Patient was subsequently admitted to the hospitalist service-see below for further details  Significant events: 1/12>> admit for evaluation s/p fall-small subdural hematoma, left greater trochanter fracture  Significant studies: 1/12>> x-ray hip-left: Nondisplaced fracture involving the greater trochanter of left femur. 1/13>> CT head: Trace subdural blood in the posterior falx 1/13>> chest x-ray: No pneumonia  Antimicrobial therapy: None  Microbiology data: None  Procedures : None  Consults: Orthopedics, nephrology  DVT Prophylaxis : SCDs Start: 11/05/20 0744  Hold pharmacological agents due to SDH  Subjective: Some pain at the left operative site-mild headache-3/10.  Irritated that he is still in the emergency room.  Assessment/Plan: Small subdural hematoma: Following a mechanical fall-mild headache continues but he has a nonfocal exam-Per neurosurgery note-no repeat CT head needed unless neurological change.    Left greater trochanter fracture: Appreciate orthopedic evaluation-plans are for nonoperative management-recommendations are for WBAT.  Obtain PT eval.  Await further recommendations from orthopedics.  ESRD: On HD-MWF-we will notify nephrology of patient's admission.  Fracture of right foot  metatarsal: Apparently suffered several weeks back-was being followed by podiatry-on cam boot.  Chronic systolic heart failure: Compensated-diuresis with HD.  CAD-S/P PCI 12/19/2019: No anginal symptoms-continue to hold Brilinta given SDH.  Will need to reach out to neurosurgery in the next few days-to determine when appropriate to resume.  If not able to resume antiplatelets due to [facility] need to touch base with cardiology at some point.  HTN: BP controlled-continue Coreg, Imdur-follow and adjust  HLD: Continue statin  DM-2: Continue SSI-follow and adjust.  Recent Labs    11/05/20 0839  GLUCAP 134*   HIV: Continue antiretrovirals.  GERD: Continue PPI.    History of GI bleeding due to AVMs: No history of hematochezia/melena-watch closely.  Tobacco abuse: Counseled-declined transdermal nicotine.  EtOH use: Watch closely for withdrawal symptoms-on Ativan per CIWA protocol  Obesity: Estimated body mass index is 22.08 kg/m as calculated from the following:   Height as of 10/20/20: 6\' 2"  (1.88 m).   Weight as of 10/20/20: 78 kg.    Diet: Diet Order            Diet renal with fluid restriction Fluid restriction: 1200 mL Fluid; Room service appropriate? Yes; Fluid consistency: Thin  Diet effective now                  Code Status: Full code   Family Communication: Hazel-(Sister)-[phone]-left voicemail on 1/13  Disposition Plan: Status is: Observation  The patient remains OBS appropriate and will d/c before 2 midnights.  Dispo: The patient is from: Home              Anticipated d/c is to: Home  Anticipated d/c date is: 2 days              Patient currently is not medically stable to d/c.   Barriers to Discharge: Left hip fracture-small SDH-await PT eval  Antimicrobial agents: Anti-infectives (From admission, onward)   Start     Dose/Rate Route Frequency Ordered Stop   11/05/20 1000  bictegravir-emtricitabine-tenofovir AF (BIKTARVY) 50-200-25  MG per tablet 1 tablet        1 tablet Oral Daily 11/05/20 0743         Time spent: 25- minutes-Greater than 50% of this time was spent in counseling, explanation of diagnosis, planning of further management, and coordination of care.  MEDICATIONS: Scheduled Meds: . allopurinol  100 mg Oral Daily  . bictegravir-emtricitabine-tenofovir AF  1 tablet Oral Daily  . carvedilol  3.125 mg Oral BID  . folic acid  1 mg Oral Daily  . gabapentin  300 mg Oral BID  . insulin aspart  0-15 Units Subcutaneous TID AC & HS  . isosorbide mononitrate  30 mg Oral Daily  . latanoprost  1 drop Both Eyes QHS  . multivitamin with minerals  1 tablet Oral Daily  . pantoprazole  40 mg Oral BID AC  . sevelamer carbonate  800 mg Oral TID WC  . thiamine  100 mg Oral Daily   Or  . thiamine  100 mg Intravenous Daily  . timolol  1 drop Both Eyes BH-q7a  . umeclidinium-vilanterol  1 puff Inhalation Daily   Continuous Infusions: PRN Meds:.acetaminophen **OR** acetaminophen, oxyCODONE-acetaminophen **OR** HYDROmorphone (DILAUDID) injection, LORazepam **OR** LORazepam, nitroGLYCERIN, ondansetron **OR** ondansetron (ZOFRAN) IV, polyethylene glycol   PHYSICAL EXAM: Vital signs: Vitals:   11/05/20 0600 11/05/20 0901 11/05/20 1000 11/05/20 1039  BP: (!) 155/63 (!) 146/62 138/69 (!) 150/81  Pulse: 94 95 89 93  Resp: 15 14 11 16   Temp:  98.3 F (36.8 C)  (!) 97.5 F (36.4 C)  TempSrc:  Oral  Oral  SpO2: 96% 97% 96% 100%   There were no vitals filed for this visit. There is no height or weight on file to calculate BMI.   Gen Exam:Alert awake-not in any distress HEENT:atraumatic, normocephalic Chest: B/L clear to auscultation anteriorly CVS:S1S2 regular Abdomen:soft non tender, non distended Extremities:no edema Neurology: Non focal Skin: no rash  I have personally reviewed following labs and imaging studies  LABORATORY DATA: CBC: Recent Labs  Lab 11/05/20 0212  WBC 7.9  NEUTROABS 5.7  HGB 10.4*   HCT 33.6*  MCV 103.1*  PLT 185    Basic Metabolic Panel: Recent Labs  Lab 11/05/20 0212  NA 134*  K 4.1  CL 92*  CO2 28  GLUCOSE 147*  BUN 31*  CREATININE 5.05*  CALCIUM 7.9*    GFR: CrCl cannot be calculated (Unknown ideal weight.).  Liver Function Tests: No results for input(s): AST, ALT, ALKPHOS, BILITOT, PROT, ALBUMIN in the last 168 hours. No results for input(s): LIPASE, AMYLASE in the last 168 hours. No results for input(s): AMMONIA in the last 168 hours.  Coagulation Profile: Recent Labs  Lab 11/05/20 0212  INR 1.1    Cardiac Enzymes: No results for input(s): CKTOTAL, CKMB, CKMBINDEX, TROPONINI in the last 168 hours.  BNP (last 3 results) No results for input(s): PROBNP in the last 8760 hours.  Lipid Profile: No results for input(s): CHOL, HDL, LDLCALC, TRIG, CHOLHDL, LDLDIRECT in the last 72 hours.  Thyroid Function Tests: No results for input(s): TSH, T4TOTAL, FREET4, T3FREE, THYROIDAB in the  last 72 hours.  Anemia Panel: No results for input(s): VITAMINB12, FOLATE, FERRITIN, TIBC, IRON, RETICCTPCT in the last 72 hours.  Urine analysis:    Component Value Date/Time   COLORURINE YELLOW 10/16/2020 2136   APPEARANCEUR CLEAR 10/16/2020 2136   LABSPEC 1.016 10/16/2020 2136   PHURINE 5.0 10/16/2020 2136   GLUCOSEU NEGATIVE 10/16/2020 2136   GLUCOSEU NEG mg/dL 09/20/2010 2058   HGBUR SMALL (A) 10/16/2020 2136   HGBUR negative 05/31/2010 0948   BILIRUBINUR NEGATIVE 10/16/2020 2136   [place] NEGATIVE 10/16/2020 2136   PROTEINUR 30 (A) 10/16/2020 2136   UROBILINOGEN 0.2 06/24/2015 2336   NITRITE NEGATIVE 10/16/2020 2136   LEUKOCYTESUR NEGATIVE 10/16/2020 2136    Sepsis Labs: Lactic Acid, Venous    Component Value Date/Time   LATICACIDVEN 0.9 11/10/2016 0757    MICROBIOLOGY: Recent Results (from the past 240 hour(s))  SARS CORONAVIRUS 2 (TAT 6-24 HRS) Nasopharyngeal Nasopharyngeal Swab     Status: None   Collection Time: 11/05/20  2:12  AM   Specimen: Nasopharyngeal Swab  Result Value Ref Range Status   SARS Coronavirus 2 NEGATIVE NEGATIVE Final    Comment: (NOTE) SARS-CoV-2 target nucleic acids are NOT DETECTED.  The SARS-CoV-2 RNA is generally detectable in upper and lower respiratory specimens during the acute phase of infection. Negative results do not preclude SARS-CoV-2 infection, do not rule out co-infections with other pathogens, and should not be used as the sole basis for treatment or other patient management decisions. Negative results must be combined with clinical observations, patient history, and epidemiological information. The expected result is Negative.  Fact Sheet for Patients: SugarRoll.be  Fact Sheet for Healthcare Providers: https://www.woods-mathews.com/  This test is not yet approved or cleared by the [place] FDA and  has been authorized for detection and/or diagnosis of SARS-CoV-2 by FDA under an Emergency Use Authorization (EUA). This EUA will remain  in effect (meaning this test can be used) for the duration of the COVID-19 declaration under Se ction 564(b)(1) of the Act, 21 U.S.C. section 360bbb-3(b)(1), unless the authorization is terminated or revoked sooner.  Performed at [facility], [place] [address], [place], [place] [address]     RADIOLOGY STUDIES/RESULTS: CT Head Wo Contrast  Result Date: 11/05/2020 CLINICAL DATA:  Fall, posterior head strike EXAM: CT HEAD WITHOUT CONTRAST TECHNIQUE: Contiguous axial images were obtained from the base of the skull through the vertex without intravenous contrast. COMPARISON:  CT 10/21/2016 FINDINGS: Brain: There is some new trace hyperdense thickening along the posterior falx (3/22) concerning for trace subdural blood. Finding is however on a background of some diffuse hyperattenuating mineralization along the falx and tentorium though the remainder of which appears stable from comparison. No  additional sites concerning for acute intracranial hemorrhage. No other extra-axial collection is seen. No mass effect or midline shift. No CT evident areas of infarction. Patchy areas of white matter hypoattenuation are most compatible with chronic microvascular angiopathy. Symmetric prominence of the ventricles, cisterns and sulci compatible with parenchymal volume loss. Vascular: Atherosclerotic calcification of the carotid siphons and intradural vertebral arteries. No hyperdense vessel. Skull: There are chronic areas of right occipital scarring with new areas of soft tissue thickening along the posterior scalp. No subjacent calvarial fracture is seen. No other acute osseous abnormalities or worrisome osseous lesions. Sinuses/Orbits: Paranasal sinuses and mastoid air cells are predominantly clear. Included orbital structures are unremarkable. Other: None IMPRESSION: 1. New trace hyperdense thickening along the posterior falx (3/22) concerning for trace subdural blood in the  setting of fall with head strike. Finding is however on a background of some diffuse hyperattenuating mineralization along the falx and tentorium though the remainder of which appears stable from comparison. Could consider short-term (6 hour) follow-up imaging to assess for stability. 2. Chronic areas of right occipital scarring with new areas of soft tissue thickening along the posterior scalp. No subjacent calvarial fracture. Correlate for point tenderness. 3. Chronic microvascular angiopathy and parenchymal volume loss. Intracranial atherosclerosis. Critical Value/emergent results were called by telephone at the time of interpretation on 11/05/2020 at 1:18 am to provider [facility], who verbally acknowledged these results. Electronically Signed   By: Lovena Le M.D.   On: 11/05/2020 01:19   DG Chest Port 1 View  Result Date: 11/05/2020 CLINICAL DATA:  Preoperative examination EXAM: PORTABLE CHEST 1 VIEW COMPARISON:  10/16/2020 FINDINGS:  In unchanged moderate cardiomegaly with calcific atherosclerosis. Multiple old right-sided rib fractures. Remote median sternotomy. No focal airspace consolidation or pulmonary edema. IMPRESSION: Unchanged moderate cardiomegaly without focal airspace disease. Electronically Signed   By: Ulyses Jarred M.D.   On: 11/05/2020 02:24   DG Hip Unilat W or Wo Pelvis 2-3 Views Left  Result Date: 11/04/2020 CLINICAL DATA:  Fall EXAM: DG HIP (WITH OR WITHOUT PELVIS) 2-3V LEFT COMPARISON:  CT 10/16/2020, 12/08/2015 FINDINGS: SI joints are non widened. Extensive vascular calcifications. Pubic symphysis and rami appear intact. Right femoral head projects in joint. Intramedullary rod and distal fixating screw within the left femur. Suspected acute nondisplaced fracture involving the greater trochanter of the femur. IMPRESSION: Suspected acute nondisplaced fracture involving the greater trochanter of the left femur. Electronically Signed   By: Donavan Foil M.D.   On: 11/04/2020 23:16     LOS: 0 days   Oren Binet, MD  [facility]    To contact the attending provider between 7A-7P or the covering provider during after hours 7P-7A, please log into the web site [email or web address] and access using universal [place] password for that web site. If you do not have the password, please call the hospital operator.  11/05/2020, 11:27 AM

## 2020-11-05 NOTE — Progress Notes (Signed)
Reviewed hip xrays, no surgery needed. Should be WBAT with walker. Expect hip pain with activity for a couple weeks. Consult pending this AM and I will see him Friday AM . Thanks my cell [phone]

## 2020-11-05 NOTE — Progress Notes (Signed)
[name] Progress Note  Jacob [name] [id number] 01-20-1951  70 year old male ESRD patient on HD MWF and [facility], admitted for observation due to Fall resulting in small subdural hematoma and L greater trochanter fracture.  Completed dialysis yesterday leaving a little over his dry weight and is due for dialysis again tomorrow.   Labs stable, K 4.1, SCR 5.05, BUN 31 with no acute indications for dialysis today.  Will write orders for HD tomorrow per regular schedule.  If discharged today can return to regular scheduled outpatient dialysis tomorrow morning.     OP Dialysis Orders: [facility] - MWF 4hrs 400/800, edw 78kg, 2K 2Ca AVF Sensipar 90qHD venofer 50mg  qwk mircera 114mcg q2wks - last 1/12\ Calcitriol 3.23mcg qHD NO heparin  Jen Mow, PA-C [name] Pager: [phone]

## 2020-11-05 NOTE — Consult Note (Signed)
Reason for Consult: Left hip pain and nondisplaced greater trochanter fracture Referring Physician: ED  Jacob [name] is an 70 y.o. male.  HPI: 70 year old black male is being seen for left lateral hip pain and nondisplaced greater trochanter fracture.  He is status post ORIF left hip for femur fracture.  I am not sure as to when this procedure was done.  Patient states that a few days ago he tripped and fell onto his left hip and his home.  Since that time has been having increased pain with weightbearing.  Patient presented to the emergency department yesterday and left hip x-ray showed:  EXAM: DG HIP (WITH OR WITHOUT PELVIS) 2-3V LEFT  COMPARISON:  CT 10/16/2020, 12/08/2015  FINDINGS: SI joints are non widened. Extensive vascular calcifications. Pubic symphysis and rami appear intact. Right femoral head projects in joint.  Intramedullary rod and distal fixating screw within the left femur. Suspected acute nondisplaced fracture involving the greater trochanter of the femur.  IMPRESSION: Suspected acute nondisplaced fracture involving the greater trochanter of the left femur.   Electronically Signed   By: Donavan Foil M.D.   On: 11/04/2020 23:16  Dr. Lorin Mercy reviewed films.     Past Medical History:  Diagnosis Date  . Acute respiratory failure ([place]) 03/01/2018  . Arthritis    "all over; mostly knees and back" (02/28/2018)  . Chronic lower back pain    stenosis  . Community acquired pneumonia 09/06/2013  . COPD (chronic obstructive pulmonary disease) ([place])   . Coronary atherosclerosis of native coronary artery    a. 02/2003 s/p CABG x 2 (VG->RI, VG->RPDA; b. 11/2019 PCI: LM nl, LAD 90d, D3 50, RI 100, LCX 100p, OM3 100 - fills via L->L collats from D2/dLAD, RCA 100p, VG->RPDA ok, VG->RI 95 (3.5x48 Synergy XD DES).  . Drug abuse ([place])    hx; tested for cocaine as recently as 2/08. says he is not using drugs now - avoided defib. for this reason   . ESRD (end stage renal  disease) ([place])    Hemo M-W-F- Richarda Blade  . GERD (gastroesophageal reflux disease)    takes OTC meds as needed  . GI bleeding    a. 11/2019 EGD: angiodysplastic lesions w/ bleeding s/p argon plasma/clipping/epi inj.  . Glaucoma    uses eye drops daily  . Hepatitis B 1968   "tx'd w/isolation; caught it from toilet stools in gym"  . HFrEF (heart failure with reduced ejection fraction) ([place])    a. 01/2019 Echo: EF 40-45%, diffuse HK, mild basal septal hypertrophy.  . History of blood transfusion 03/01/2019  . History of colon polyps    benign  . History of gout    takes Allopurinol daily as well as Colchicine-if needed (02/28/2018)  . History of kidney stones   . HTN (hypertension)    takes Coreg,Imdur.and Apresoline daily  . Human immunodeficiency virus (HIV) disease ([place]) dx'd 1995   takes Genvoya daily  . Hyperlipidemia    takes Atorvastatin daily  . Ischemic cardiomyopathy    a. 01/2019 Echo: EF 40-45%, diffuse HK, mild basal septal hypertrophy. Diast dysfxn. Nl RV size/fxn. Sev dil LA. Triv MR/TR/PR.  [name] Muscle spasm    takes Zanaflex as needed  . Myocardial infarction ([place]) ~ 2004/2005  . Nocturia   . Peripheral neuropathy    takes gabapentin daily  . Pneumonia    "at least twice" (02/28/2018)  . Shortness of breath dyspnea    rarely but if notices it then with exertion  .  Syphilis, unspecified   . Type II diabetes mellitus ([place]) 2004   Lantus daily.Average fasting blood sugar 125-199  . Wears glasses   . Wears partial dentures     Past Surgical History:  Procedure Laterality Date  . AV FISTULA PLACEMENT Left 08/02/2018   Procedure: ARTERIOVENOUS (AV) FISTULA CREATION  left arm radiocephlic;  Surgeon: Marty Heck, MD;  Location: [facility];  Service: Vascular;  Laterality: Left;  . AV FISTULA PLACEMENT Left 08/01/2019   Procedure: LEFT BRACHIOCEPHALIC ARTERIOVENOUS (AV) FISTULA CREATION;  Surgeon: [name], MD;  Location: [place];  Service: Vascular;  Laterality: Left;   . BASCILIC VEIN TRANSPOSITION Left 10/03/2019   Procedure: BASILIC VEIN TRANSPOSITION LEFT SECOND STAGE;  Surgeon: [name], MD;  Location: [facility];  Service: Vascular;  Laterality: Left;  . CARDIAC CATHETERIZATION  10/2002; 12/19/2004   [name] 03/08/2011  . COLONOSCOPY  2013   Dr.John Henrene Pastor   . CORONARY ARTERY BYPASS GRAFT  02/24/2003   CABG X2/notes 03/08/2011  . CORONARY STENT INTERVENTION N/A 12/19/2019   Procedure: CORONARY STENT INTERVENTION;  Surgeon: Jettie Booze, MD;  Location: [facility];  Service: Cardiovascular;  Laterality: N/A;  . ESOPHAGOGASTRODUODENOSCOPY (EGD) WITH PROPOFOL N/A 02/08/2019   Procedure: ESOPHAGOGASTRODUODENOSCOPY (EGD) WITH PROPOFOL;  Surgeon: Milus Banister, MD;  Location: [name] [facility];  Service: Gastroenterology;  Laterality: N/A;  . ESOPHAGOGASTRODUODENOSCOPY (EGD) WITH PROPOFOL N/A 12/22/2019   Procedure: ESOPHAGOGASTRODUODENOSCOPY (EGD) WITH PROPOFOL;  Surgeon: Lavena Bullion, DO;  Location: [facility];  Service: Gastroenterology;  Laterality: N/A;  . ESOPHAGOGASTRODUODENOSCOPY (EGD) WITH PROPOFOL N/A 10/19/2020   Procedure: ESOPHAGOGASTRODUODENOSCOPY (EGD) WITH PROPOFOL;  Surgeon: Jackquline Denmark, MD;  Location: [facility] ENDOSCOPY;  Service: Endoscopy;  Laterality: N/A;  . HEMOSTASIS CLIP PLACEMENT  12/22/2019   Procedure: HEMOSTASIS CLIP PLACEMENT;  Surgeon: Lavena Bullion, DO;  Location: [facility];  Service: Gastroenterology;;  . HOT HEMOSTASIS N/A 02/08/2019   Procedure: HOT HEMOSTASIS (ARGON PLASMA COAGULATION/BICAP);  Surgeon: Milus Banister, MD;  Location: [facility] ENDOSCOPY;  Service: Gastroenterology;  Laterality: N/A;  . HOT HEMOSTASIS N/A 12/22/2019   Procedure: HOT HEMOSTASIS (ARGON PLASMA COAGULATION/BICAP);  Surgeon: Lavena Bullion, DO;  Location: [facility] ENDOSCOPY;  Service: Gastroenterology;  Laterality: N/A;  . HOT HEMOSTASIS N/A 10/19/2020   Procedure: HOT HEMOSTASIS (ARGON PLASMA COAGULATION/BICAP);  Surgeon: Jackquline Denmark,  MD;  Location: [facility] ENDOSCOPY;  Service: Endoscopy;  Laterality: N/A;  . INTERTROCHANTERIC HIP FRACTURE SURGERY Left 11/2006   [name] 03/08/2011  . INTRAVASCULAR ULTRASOUND/IVUS N/A 12/19/2019   Procedure: Intravascular Ultrasound/IVUS;  Surgeon: Jettie Booze, MD;  Location: [place] [facility];  Service: Cardiovascular;  Laterality: N/A;  . IR FLUORO GUIDE CV LINE RIGHT  07/24/2019  . IR FLUORO GUIDE CV LINE RIGHT  07/30/2019  . IR US GUIDE VASC ACCESS RIGHT  07/24/2019  . IR US GUIDE VASC ACCESS RIGHT  07/30/2019  . LAPAROSCOPIC CHOLECYSTECTOMY  05/2006  . LIGATION OF COMPETING BRANCHES OF ARTERIOVENOUS FISTULA Left 11/05/2018   Procedure: LIGATION OF COMPETING BRANCHES OF ARTERIOVENOUS FISTULA  LEFT  ARM;  Surgeon: Marty Heck, MD;  Location: [place];  Service: Vascular;  Laterality: Left;  . LUMBAR LAMINECTOMY/DECOMPRESSION MICRODISCECTOMY N/A 02/29/2016   Procedure: Left L4-5 Lateral Recess Decompression, Removal Extradural Intraspinal Facet Cyst;  Surgeon: Marybelle Killings, MD;  Location: [place];  Service: Orthopedics;  Laterality: N/A;  . MULTIPLE TOOTH EXTRACTIONS    . ORIF MANDIBULAR FRACTURE Left 08/13/2004   ORIF of left body fracture mandible with KLS  Martin 2.3-mm six hole/notes 03/08/2011  . RIGHT/LEFT HEART CATH AND CORONARY/GRAFT ANGIOGRAPHY N/A 12/19/2019   Procedure: RIGHT/LEFT HEART CATH AND CORONARY/GRAFT ANGIOGRAPHY;  Surgeon: Jettie Booze, MD;  Location: [place] [facility];  Service: Cardiovascular;  Laterality: N/A;  . SCLEROTHERAPY  12/22/2019   Procedure: SCLEROTHERAPY;  Surgeon: Lavena Bullion, DO;  Location: [facility] ENDOSCOPY;  Service: Gastroenterology;;    Family History  Problem Relation Age of Onset  . Heart failure Father   . Hypertension Father   . Diabetes Brother   . Heart attack Brother   . Alzheimer's disease Mother   . Stroke Sister   . Diabetes Sister   . Alzheimer's disease Sister   . Hypertension Brother   . Diabetes Brother   . Drug abuse  Brother   . Colon cancer Neg Hx     Social History:  reports that he has been smoking cigarettes. He has a 21.50 pack-year smoking history. He has never used smokeless tobacco. He reports current alcohol use of about 12.0 standard drinks of alcohol per week. He reports that he does not use drugs.  Allergies:  Allergies  Allergen Reactions  . Augmentin [Amoxicillin-Pot Clavulanate] Diarrhea    Severe diarrhea  . Amphetamines Other (See Comments)    Unknown reaction type    Medications: I have reviewed the patient's current medications.  Results for orders placed or performed during the hospital encounter of 11/04/20 (from the past 48 hour(s))  Basic metabolic panel     Status: Abnormal   Collection Time: 11/05/20  2:12 AM  Result Value Ref Range   Sodium 134 (L) 135 - 145 mmol/L   Potassium 4.1 3.5 - 5.1 mmol/L   Chloride 92 (L) 98 - 111 mmol/L   CO2 28 22 - 32 mmol/L   Glucose, Bld 147 (H) 70 - 99 mg/dL    Comment: Glucose reference range applies only to samples taken after fasting for at least 8 hours.   BUN 31 (H) 8 - 23 mg/dL   Creatinine, Ser 5.05 (H) 0.61 - 1.24 mg/dL   Calcium 7.9 (L) 8.9 - 10.3 mg/dL   GFR, Estimated 12 (L) >60 mL/min    Comment: (NOTE) Calculated using the CKD-EPI Creatinine Equation (2021)    Anion gap 14 5 - 15    Comment: Performed at [facility] [address], [place], [place] [address]  CBC WITH DIFFERENTIAL     Status: Abnormal   Collection Time: 11/05/20  2:12 AM  Result Value Ref Range   WBC 7.9 4.0 - 10.5 K/uL   RBC 3.26 (L) 4.22 - 5.81 MIL/uL   Hemoglobin 10.4 (L) 13.0 - 17.0 g/dL   HCT 33.6 (L) 39.0 - 52.0 %   MCV 103.1 (H) 80.0 - 100.0 fL   MCH 31.9 26.0 - 34.0 pg   MCHC 31.0 30.0 - 36.0 g/dL   RDW 16.9 (H) 11.5 - 15.5 %   Platelets 164 150 - 400 K/uL   nRBC 0.0 0.0 - 0.2 %   Neutrophils Relative % 73 %   Neutro Abs 5.7 1.7 - 7.7 K/uL   Lymphocytes Relative 13 %   Lymphs Abs 1.0 0.7 - 4.0 K/uL   Monocytes Relative 12 %    Monocytes Absolute 1.0 0.1 - 1.0 K/uL   Eosinophils Relative 1 %   Eosinophils Absolute 0.1 0.0 - 0.5 K/uL   Basophils Relative 1 %   Basophils Absolute 0.0 0.0 - 0.1 K/uL   Immature Granulocytes 0 %  Abs Immature Granulocytes 0.02 0.00 - 0.07 K/uL    Comment: Performed at [facility], [place] [address] [place], [place] [address]  Protime-INR     Status: None   Collection Time: 11/05/20  2:12 AM  Result Value Ref Range   Prothrombin Time 13.8 11.4 - 15.2 seconds   INR 1.1 0.8 - 1.2    Comment: (NOTE) INR goal varies based on device and disease states. Performed at [facility], [place] [address], [place], [place] [address]   Type and screen [place]     Status: None   Collection Time: 11/05/20  2:12 AM  Result Value Ref Range   ABO/RH(D) A POS    Antibody Screen NEG    Sample Expiration      11/08/2020,2359 Performed at [facility], [place] [address], [place], [place] [address]   SARS CORONAVIRUS 2 (TAT 6-24 HRS) Nasopharyngeal Nasopharyngeal Swab     Status: None   Collection Time: 11/05/20  2:12 AM   Specimen: Nasopharyngeal Swab  Result Value Ref Range   SARS Coronavirus 2 NEGATIVE NEGATIVE    Comment: (NOTE) SARS-CoV-2 target nucleic acids are NOT DETECTED.  The SARS-CoV-2 RNA is generally detectable in upper and lower respiratory specimens during the acute phase of infection. Negative results do not preclude SARS-CoV-2 infection, do not rule out co-infections with other pathogens, and should not be used as the sole basis for treatment or other patient management decisions. Negative results must be combined with clinical observations, patient history, and epidemiological information. The expected result is Negative.  Fact Sheet for Patients: SugarRoll.be  Fact Sheet for Healthcare Providers: https://www.woods-mathews.com/  This test is not yet approved or cleared by the [place] FDA  and  has been authorized for detection and/or diagnosis of SARS-CoV-2 by FDA under an Emergency Use Authorization (EUA). This EUA will remain  in effect (meaning this test can be used) for the duration of the COVID-19 declaration under Se ction 564(b)(1) of the Act, 21 U.S.C. section 360bbb-3(b)(1), unless the authorization is terminated or revoked sooner.  Performed at [facility], [place] [address] [place], [place] [address]   CBG monitoring, ED     Status: Abnormal   Collection Time: 11/05/20  8:39 AM  Result Value Ref Range   Glucose-Capillary 134 (H) 70 - 99 mg/dL    Comment: Glucose reference range applies only to samples taken after fasting for at least 8 hours.   Comment 1 Notify RN    Comment 2 Document in Chart    *Note: Due to a large number of results and/or encounters for the requested time period, some results have not been displayed. A complete set of results can be found in Results Review.    CT Head Wo Contrast  Result Date: 11/05/2020 CLINICAL DATA:  Fall, posterior head strike EXAM: CT HEAD WITHOUT CONTRAST TECHNIQUE: Contiguous axial images were obtained from the base of the skull through the vertex without intravenous contrast. COMPARISON:  CT 10/21/2016 FINDINGS: Brain: There is some new trace hyperdense thickening along the posterior falx (3/22) concerning for trace subdural blood. Finding is however on a background of some diffuse hyperattenuating mineralization along the falx and tentorium though the remainder of which appears stable from comparison. No additional sites concerning for acute intracranial hemorrhage. No other extra-axial collection is seen. No mass effect or midline shift. No CT evident areas of infarction. Patchy areas of white matter hypoattenuation are most compatible with  chronic microvascular angiopathy. Symmetric prominence of the ventricles, cisterns and sulci compatible with parenchymal volume loss. Vascular: Atherosclerotic calcification  of the carotid siphons and intradural vertebral arteries. No hyperdense vessel. Skull: There are chronic areas of right occipital scarring with new areas of soft tissue thickening along the posterior scalp. No subjacent calvarial fracture is seen. No other acute osseous abnormalities or worrisome osseous lesions. Sinuses/Orbits: Paranasal sinuses and mastoid air cells are predominantly clear. Included orbital structures are unremarkable. Other: None IMPRESSION: 1. New trace hyperdense thickening along the posterior falx (3/22) concerning for trace subdural blood in the setting of fall with head strike. Finding is however on a background of some diffuse hyperattenuating mineralization along the falx and tentorium though the remainder of which appears stable from comparison. Could consider short-term (6 hour) follow-up imaging to assess for stability. 2. Chronic areas of right occipital scarring with new areas of soft tissue thickening along the posterior scalp. No subjacent calvarial fracture. Correlate for point tenderness. 3. Chronic microvascular angiopathy and parenchymal volume loss. Intracranial atherosclerosis. Critical Value/emergent results were called by telephone at the time of interpretation on 11/05/2020 at 1:18 am to provider [facility], who verbally acknowledged these results. Electronically Signed   By: Lovena Le M.D.   On: 11/05/2020 01:19   DG Chest Port 1 View  Result Date: 11/05/2020 CLINICAL DATA:  Preoperative examination EXAM: PORTABLE CHEST 1 VIEW COMPARISON:  10/16/2020 FINDINGS: In unchanged moderate cardiomegaly with calcific atherosclerosis. Multiple old right-sided rib fractures. Remote median sternotomy. No focal airspace consolidation or pulmonary edema. IMPRESSION: Unchanged moderate cardiomegaly without focal airspace disease. Electronically Signed   By: Ulyses Jarred M.D.   On: 11/05/2020 02:24   DG Hip Unilat W or Wo Pelvis 2-3 Views Left  Result Date: 11/04/2020 CLINICAL  DATA:  Fall EXAM: DG HIP (WITH OR WITHOUT PELVIS) 2-3V LEFT COMPARISON:  CT 10/16/2020, 12/08/2015 FINDINGS: SI joints are non widened. Extensive vascular calcifications. Pubic symphysis and rami appear intact. Right femoral head projects in joint. Intramedullary rod and distal fixating screw within the left femur. Suspected acute nondisplaced fracture involving the greater trochanter of the femur. IMPRESSION: Suspected acute nondisplaced fracture involving the greater trochanter of the left femur. Electronically Signed   By: Donavan Foil M.D.   On: 11/04/2020 23:16    Review of Systems  Constitutional: Positive for activity change.  HENT: Negative.   Genitourinary: Negative.   Musculoskeletal: Positive for gait problem.   Blood pressure 138/69, pulse 89, temperature 98.3 F (36.8 C), temperature source Oral, resp. rate 11, SpO2 96 %. Physical Exam HENT:     Head: Normocephalic.  Eyes:     Extraocular Movements: Extraocular movements intact.  Pulmonary:     Effort: No respiratory distress.  Musculoskeletal:     Comments: Patient has marked tenderness of the left hip greater trochanter.  Neurologically intact.  Lateral hip discomfort with hip internal/external rotation.  Neurological:     Mental Status: He is alert.  Psychiatric:        Behavior: Behavior normal.     Assessment/Plan: Nondisplaced left greater trochanter fracture.   Dr. Lorin Mercy reviewed left hip x-rays.  Patient will be admitted to the medicine service.  Per Dr. Lorin Mercy it is okay for patient to weight-bear as tolerated left lower extremity.  Recommend that he use a walker.  Surgery not indicated.  Dr Lorin Mercy will see patient tomorrow.  If any questions please contact our office.  Benjiman Core 11/05/2020, 10:19 AM

## 2020-11-05 NOTE — Progress Notes (Signed)
Patient fell 2 days ago, not on any blood thinners. Ct today shows trace sdh along the posterior falx. No surgical intervention needed. No follow up head ct needed unless neurologic change.

## 2020-11-05 NOTE — ED Notes (Signed)
Attempted report x1. 

## 2020-11-05 NOTE — ED Notes (Addendum)
This RN walked into the room and pt immediately started cussing this RN out. Pt stated "This is unprofessional to leave someone down here in the bed like this. I've got to use the damn bathroom." This RN explained that I just received report and took over for the shift that I apologized for him being upset with staff but that I could assist him with the bathroom. Pt demanded a bedside commode. This RN asked pt to give her a minute to go find one. Pt stated "Damn I'm going to shit on myself waiting on you." This RN explained that I did not have one in the room and would need to leave to get one, unless he wanted to use a bedpab and pt refused. Pt then requested main number to the hospital stating "I want the main number because I want to call the director because this is bullshit, how long am I going to be down here." This RN then explained that we had 25+ people waiting for a bed that I was not able to give him a time frame. Pt stated "I don't care if it's 50 people waiting you don't do this to people. You didn't even hang my coat up." This RN again apologized for his unpleasant visit and that we would try to make the experience today much better for him. Pt still not happy with RN's response. Will continue to monitor.

## 2020-11-05 NOTE — ED Notes (Addendum)
Pt called out stating he needed to use the restroom. This RN informed him that he had a broken hip and that the doctor wanted him on bedrest and to use a urinal or bedpan. Pt refused. Pt stated "I can't use those, my legs are too long, it makes too much of a mess and I used the bedside commode earlier so no reason I can't use it now. This RN informed pt that he could potentially cause worse damage to his injury and needed to stay in the bed. Pt stated "he did not care" and got himself out of the bed and on the the bedside commode. Will continue to monitor.

## 2020-11-06 ENCOUNTER — Inpatient Hospital Stay (HOSPITAL_COMMUNITY): Payer: Medicare Other

## 2020-11-06 DIAGNOSIS — S72115A Nondisplaced fracture of greater trochanter of left femur, initial encounter for closed fracture: Secondary | ICD-10-CM | POA: Diagnosis not present

## 2020-11-06 DIAGNOSIS — W19XXXA Unspecified fall, initial encounter: Secondary | ICD-10-CM | POA: Diagnosis not present

## 2020-11-06 DIAGNOSIS — I5042 Chronic combined systolic (congestive) and diastolic (congestive) heart failure: Secondary | ICD-10-CM | POA: Diagnosis not present

## 2020-11-06 DIAGNOSIS — Z951 Presence of aortocoronary bypass graft: Secondary | ICD-10-CM | POA: Diagnosis not present

## 2020-11-06 DIAGNOSIS — J449 Chronic obstructive pulmonary disease, unspecified: Secondary | ICD-10-CM | POA: Diagnosis present

## 2020-11-06 DIAGNOSIS — Z79899 Other long term (current) drug therapy: Secondary | ICD-10-CM | POA: Diagnosis not present

## 2020-11-06 DIAGNOSIS — I255 Ischemic cardiomyopathy: Secondary | ICD-10-CM | POA: Diagnosis present

## 2020-11-06 DIAGNOSIS — S92351A Displaced fracture of fifth metatarsal bone, right foot, initial encounter for closed fracture: Secondary | ICD-10-CM | POA: Diagnosis present

## 2020-11-06 DIAGNOSIS — S065X9A Traumatic subdural hemorrhage with loss of consciousness of unspecified duration, initial encounter: Principal | ICD-10-CM

## 2020-11-06 DIAGNOSIS — H409 Unspecified glaucoma: Secondary | ICD-10-CM | POA: Diagnosis present

## 2020-11-06 DIAGNOSIS — G8929 Other chronic pain: Secondary | ICD-10-CM | POA: Diagnosis present

## 2020-11-06 DIAGNOSIS — N186 End stage renal disease: Secondary | ICD-10-CM

## 2020-11-06 DIAGNOSIS — Z6822 Body mass index (BMI) 22.0-22.9, adult: Secondary | ICD-10-CM | POA: Diagnosis not present

## 2020-11-06 DIAGNOSIS — B20 Human immunodeficiency virus [HIV] disease: Secondary | ICD-10-CM

## 2020-11-06 DIAGNOSIS — F1010 Alcohol abuse, uncomplicated: Secondary | ICD-10-CM

## 2020-11-06 DIAGNOSIS — E782 Mixed hyperlipidemia: Secondary | ICD-10-CM | POA: Diagnosis present

## 2020-11-06 DIAGNOSIS — N2581 Secondary hyperparathyroidism of renal origin: Secondary | ICD-10-CM | POA: Diagnosis present

## 2020-11-06 DIAGNOSIS — Y92009 Unspecified place in unspecified non-institutional (private) residence as the place of occurrence of the external cause: Secondary | ICD-10-CM | POA: Diagnosis not present

## 2020-11-06 DIAGNOSIS — Z8719 Personal history of other diseases of the digestive system: Secondary | ICD-10-CM | POA: Diagnosis not present

## 2020-11-06 DIAGNOSIS — I2510 Atherosclerotic heart disease of native coronary artery without angina pectoris: Secondary | ICD-10-CM | POA: Diagnosis not present

## 2020-11-06 DIAGNOSIS — S065XAA Traumatic subdural hemorrhage with loss of consciousness status unknown, initial encounter: Secondary | ICD-10-CM | POA: Diagnosis present

## 2020-11-06 DIAGNOSIS — E871 Hypo-osmolality and hyponatremia: Secondary | ICD-10-CM | POA: Diagnosis present

## 2020-11-06 DIAGNOSIS — Z992 Dependence on renal dialysis: Secondary | ICD-10-CM | POA: Diagnosis not present

## 2020-11-06 DIAGNOSIS — K219 Gastro-esophageal reflux disease without esophagitis: Secondary | ICD-10-CM | POA: Diagnosis present

## 2020-11-06 DIAGNOSIS — K31819 Angiodysplasia of stomach and duodenum without bleeding: Secondary | ICD-10-CM | POA: Diagnosis present

## 2020-11-06 DIAGNOSIS — S065X0A Traumatic subdural hemorrhage without loss of consciousness, initial encounter: Secondary | ICD-10-CM | POA: Diagnosis not present

## 2020-11-06 DIAGNOSIS — E1142 Type 2 diabetes mellitus with diabetic polyneuropathy: Secondary | ICD-10-CM | POA: Diagnosis present

## 2020-11-06 DIAGNOSIS — Z87442 Personal history of urinary calculi: Secondary | ICD-10-CM | POA: Diagnosis not present

## 2020-11-06 DIAGNOSIS — Z20822 Contact with and (suspected) exposure to covid-19: Secondary | ICD-10-CM | POA: Diagnosis present

## 2020-11-06 DIAGNOSIS — I132 Hypertensive heart and chronic kidney disease with heart failure and with stage 5 chronic kidney disease, or end stage renal disease: Secondary | ICD-10-CM | POA: Diagnosis present

## 2020-11-06 DIAGNOSIS — Z8679 Personal history of other diseases of the circulatory system: Secondary | ICD-10-CM | POA: Diagnosis not present

## 2020-11-06 DIAGNOSIS — R519 Headache, unspecified: Secondary | ICD-10-CM | POA: Diagnosis present

## 2020-11-06 DIAGNOSIS — W010XXA Fall on same level from slipping, tripping and stumbling without subsequent striking against object, initial encounter: Secondary | ICD-10-CM | POA: Diagnosis present

## 2020-11-06 LAB — COMPREHENSIVE METABOLIC PANEL
ALT: 13 U/L (ref 0–44)
AST: 19 U/L (ref 15–41)
Albumin: 3 g/dL — ABNORMAL LOW (ref 3.5–5.0)
Alkaline Phosphatase: 100 U/L (ref 38–126)
Anion gap: 15 (ref 5–15)
BUN: 50 mg/dL — ABNORMAL HIGH (ref 8–23)
CO2: 28 mmol/L (ref 22–32)
Calcium: 8.4 mg/dL — ABNORMAL LOW (ref 8.9–10.3)
Chloride: 94 mmol/L — ABNORMAL LOW (ref 98–111)
Creatinine, Ser: 6.41 mg/dL — ABNORMAL HIGH (ref 0.61–1.24)
GFR, Estimated: 9 mL/min — ABNORMAL LOW (ref >60)
Glucose, Bld: 115 mg/dL — ABNORMAL HIGH (ref 70–99)
Potassium: 3.7 mmol/L (ref 3.5–5.1)
Sodium: 137 mmol/L (ref 135–145)
Total Bilirubin: 0.6 mg/dL (ref 0.3–1.2)
Total Protein: 7.5 g/dL (ref 6.5–8.1)

## 2020-11-06 LAB — CBC WITH DIFFERENTIAL/PLATELET
Abs Immature Granulocytes: 0.02 10*3/uL (ref 0.00–0.07)
Basophils Absolute: 0 10*3/uL (ref 0.0–0.1)
Basophils Relative: 1 %
Eosinophils Absolute: 0.2 10*3/uL (ref 0.0–0.5)
Eosinophils Relative: 3 %
HCT: 31.8 % — ABNORMAL LOW (ref 39.0–52.0)
Hemoglobin: 9.8 g/dL — ABNORMAL LOW (ref 13.0–17.0)
Immature Granulocytes: 0 %
Lymphocytes Relative: 22 %
Lymphs Abs: 1.4 10*3/uL (ref 0.7–4.0)
MCH: 31.7 pg (ref 26.0–34.0)
MCHC: 30.8 g/dL (ref 30.0–36.0)
MCV: 102.9 fL — ABNORMAL HIGH (ref 80.0–100.0)
Monocytes Absolute: 0.8 10*3/uL (ref 0.1–1.0)
Monocytes Relative: 13 %
Neutro Abs: 3.9 10*3/uL (ref 1.7–7.7)
Neutrophils Relative %: 61 %
Platelets: 172 10*3/uL (ref 150–400)
RBC: 3.09 MIL/uL — ABNORMAL LOW (ref 4.22–5.81)
RDW: 16.9 % — ABNORMAL HIGH (ref 11.5–15.5)
WBC: 6.3 10*3/uL (ref 4.0–10.5)
nRBC: 0 % (ref 0.0–0.2)

## 2020-11-06 LAB — GLUCOSE, CAPILLARY
Glucose-Capillary: 188 mg/dL — ABNORMAL HIGH (ref 70–99)
Glucose-Capillary: 146 mg/dL — ABNORMAL HIGH (ref 70–99)
Glucose-Capillary: 153 mg/dL — ABNORMAL HIGH (ref 70–99)
Glucose-Capillary: 178 mg/dL — ABNORMAL HIGH (ref 70–99)

## 2020-11-06 LAB — MAGNESIUM: Magnesium: 2.3 mg/dL (ref 1.7–2.4)

## 2020-11-06 LAB — HEPATITIS B SURFACE ANTIBODY,QUALITATIVE: Hep B S Ab: REACTIVE — AB

## 2020-11-06 LAB — HEPATITIS B SURFACE ANTIGEN: Hepatitis B Surface Ag: NONREACTIVE

## 2020-11-06 LAB — HEPATITIS B CORE ANTIBODY, TOTAL: Hep B Core Total Ab: REACTIVE — AB

## 2020-11-06 LAB — PHOSPHORUS: Phosphorus: 5.8 mg/dL — ABNORMAL HIGH (ref 2.5–4.6)

## 2020-11-06 NOTE — Progress Notes (Signed)
   Subjective:    Patient reports pain as mild.    Objective: Vital signs in last 24 hours: Temp:  [97.5 F (36.4 C)-98.3 F (36.8 C)] 98.2 F (36.8 C) (01/14 0642) Pulse Rate:  [81-95] 91 (01/14 0642) Resp:  [11-18] 18 (01/14 0642) BP: (120-150)/(62-81) 148/74 (01/14 0642) SpO2:  [96 %-100 %] 100 % (01/14 0642)  Intake/Output from previous day: 01/13 0701 - 01/14 0700 In: 300 [P.O.:300] Out: 1 [Stool:1] Intake/Output this shift: No intake/output data recorded.  Recent Labs    11/05/20 0212 11/06/20 0310  HGB 10.4* 9.8*   Recent Labs    11/05/20 0212 11/06/20 0310  WBC 7.9 6.3  RBC 3.26* 3.09*  HCT 33.6* 31.8*  PLT 164 172   Recent Labs    11/05/20 0212 11/06/20 0310  NA 134* 137  K 4.1 3.7  CL 92* 94*  CO2 28 28  BUN 31* 50*  CREATININE 5.05* 6.41*  GLUCOSE 147* 115*  CALCIUM 7.9* 8.4*   Recent Labs    11/05/20 0212  INR 1.1    Neurologically intact No results found.  Assessment/Plan:    Up with therapy, home with walker . Follow up 3  wks.  . Conservative Tx of greater troch fracture discussed. Discussed stairs, using cane, so he can get to dialysis.   Marybelle Killings 11/06/2020, 7:56 AM

## 2020-11-06 NOTE — Progress Notes (Signed)
OT Cancellation Note  Patient Details Name: Jacob [name] MRN: [id number] DOB: [birth year]   Cancelled Treatment:    Reason Eval/Treat Not Completed: Patient at procedure or test/ unavailable  Britt Bottom 11/06/2020, 2:00 PM

## 2020-11-06 NOTE — Progress Notes (Signed)
[name], male   DOB: 05/15/1951, 70 y.o.   MRN: [id number]  PROGRESS NOTE    Jacob [name]  RCV:[id number] DOB: Mar 09, 1951 DOA: 11/04/2020 PCP: [name], NP   Brief Narrative:  70 year old male with history of HIV, end-stage renal disease on hemodialysis, CAD status post CABG in 2004 and DES in 10/256, chronic systolic and diastolic heart failure, diabetes mellitus type 2, alcohol abuse, hypertension, hyperlipidemia, multiple GI bleeds due to gastric AVMs with last in late December 2021 presented with severe headache along with recent fall with subsequent left hip pain.  On presentation, he was found to have a small subdural hematoma for which neurosurgery recommended conservative management.  He was also found to have nondisplaced fracture involving the greater trochanter of left femur.  Orthopedics was consulted  Assessment & Plan:   Small subdural hematoma following a fall -CT imaging on presentation was reviewed by neurosurgery on-call who recommended conservative management and no need for repeat CT.  Aspirin and Brilinta on hold.  I have tried to communicate with neurosurgery regarding timing to restart these antiplatelets. -Mental status currently stable  Left femur greater trochanteric fracture -Conservative management as per orthopedics with outpatient follow-up with orthopedics. -PT recommends SNF placement.  Social worker consult.  Fall precautions.  Pain management.  End-stage renal disease on hemodialysis -Nephrology following.  Dialysis as per nephrology schedule  Fracture of right foot metatarsal -Apparently suffered several weeks back: Was being followed by podiatry: On cam boot  Chronic systolic and diastolic heart failure -Compensated.  Volume managed by dialysis.  CAD status post CABG in 2014 and DES in 11/2019 -Stable.  No angina.  Aspirin and Brilinta on hold.  We will follow-up with neurosurgery regarding the same.  Outpatient follow-up  with cardiology  Hypertension -Blood pressure stable.  Continue Coreg, Imdur  HIV -Continue current antiretroviral regimen.  Outpatient follow-up with ID  Hyperlipidemia -Continue statin  Diabetes mellitus type 2 -Continue CBGs with SSI  GERD -Continue PPI  History of GI bleeding due to AVMs -No history of recent hematochezia/melena.  Monitor  Tobacco abuse -Patient was counseled regarding the same by prior hospitalist.  Alcohol abuse -Continue CIWA protocol.  Continue multivitamin, thiamine and folic acid   DVT prophylaxis: SCDs Code Status: Full Family Communication: None at bedside Disposition Plan: Status is: Observation  The patient will require care spanning > 2 midnights and should be moved to inpatient because: Inpatient level of care appropriate due to severity of illness  Dispo: The patient is from: Home              Anticipated d/c is to: SNF.  Patient currently not sure if she would want to go to SNF.              Anticipated d/c date is: 1 day              Patient currently is medically stable to d/c.  Consultants: Neurosurgery consulted by ED.  Orthopedic/nephrology  Procedures: None  Antimicrobials: Antiretroviral for HIV   Subjective: Patient seen and examined at bedside.  For external.  No overnight fever, vomiting or worsening headache reported.  States that he wants to go home.  Physical therapist present at bedside stated that he should go to SNF  Objective: Vitals:   11/05/20 1039 11/05/20 1722 11/05/20 2024 11/06/20 0642  BP: (!) 150/81 131/66 120/73 (!) 148/74  Pulse: 93 81 89 91  Resp: 16 17 16 18   Temp: [name])  97.5 F (36.4 C) 98.2 F (36.8 C) 98.2 F (36.8 C) 98.2 F (36.8 C)  TempSrc: Oral Oral Oral Oral  SpO2: 100% 96% 100% 100%    Intake/Output Summary (Last 24 hours) at 11/06/2020 1209 Last data filed at 11/06/2020 0745 Gross per 24 hour  Intake 540 ml  Output --  Net 540 ml   There were no vitals filed for this  visit.  Examination:  General exam: Appears calm and comfortable. No distress. Respiratory system: Bilateral decreased breath sounds at bases with some scattered crackles Cardiovascular system: S1 & S2 heard, Rate controlled Gastrointestinal system: Abdomen is nondistended, soft and nontender. Normal bowel sounds heard. Extremities: No cyanosis, clubbing; trace lower extremity edema present.  Right foot Cam boot present Central nervous system: Alert and oriented.  Slow to respond.  Poor historian no focal neurological deficits. Moving extremities Skin: No rashes, lesions or ulcers Psychiatry: Flat affect   Data Reviewed: I have personally reviewed following labs and imaging studies  CBC: Recent Labs  Lab 11/05/20 0212 11/06/20 0310  WBC 7.9 6.3  NEUTROABS 5.7 3.9  HGB 10.4* 9.8*  HCT 33.6* 31.8*  MCV 103.1* 102.9*  PLT 164 720   Basic Metabolic Panel: Recent Labs  Lab 11/05/20 0212 11/06/20 0310  NA 134* 137  K 4.1 3.7  CL 92* 94*  CO2 28 28  GLUCOSE 147* 115*  BUN 31* 50*  CREATININE 5.05* 6.41*  CALCIUM 7.9* 8.4*  MG  --  2.3  PHOS  --  5.8*   GFR: CrCl cannot be calculated (Unknown ideal weight.). Liver Function Tests: Recent Labs  Lab 11/06/20 0310  AST 19  ALT 13  ALKPHOS 100  BILITOT 0.6  PROT 7.5  ALBUMIN 3.0*   No results for input(s): LIPASE, AMYLASE in the last 168 hours. No results for input(s): AMMONIA in the last 168 hours. Coagulation Profile: Recent Labs  Lab 11/05/20 0212  INR 1.1   Cardiac Enzymes: No results for input(s): CKTOTAL, CKMB, CKMBINDEX, TROPONINI in the last 168 hours. BNP (last 3 results) No results for input(s): PROBNP in the last 8760 hours. HbA1C: Recent Labs    11/05/20 0212  HGBA1C 4.9   CBG: Recent Labs  Lab 11/05/20 1204 11/05/20 1612 11/05/20 2203 11/06/20 0906 11/06/20 1132  GLUCAP 128* 138* 175* 188* 146*   Lipid Profile: No results for input(s): CHOL, HDL, LDLCALC, TRIG, CHOLHDL, LDLDIRECT  in the last 72 hours. Thyroid Function Tests: No results for input(s): TSH, T4TOTAL, FREET4, T3FREE, THYROIDAB in the last 72 hours. Anemia Panel: No results for input(s): VITAMINB12, FOLATE, FERRITIN, TIBC, IRON, RETICCTPCT in the last 72 hours. Sepsis Labs: No results for input(s): PROCALCITON, LATICACIDVEN in the last 168 hours.  Recent Results (from the past 240 hour(s))  SARS CORONAVIRUS 2 (TAT 6-24 HRS) Nasopharyngeal Nasopharyngeal Swab     Status: None   Collection Time: 11/05/20  2:12 AM   Specimen: Nasopharyngeal Swab  Result Value Ref Range Status   SARS Coronavirus 2 NEGATIVE NEGATIVE Final    Comment: (NOTE) SARS-CoV-2 target nucleic acids are NOT DETECTED.  The SARS-CoV-2 RNA is generally detectable in upper and lower respiratory specimens during the acute phase of infection. Negative results do not preclude SARS-CoV-2 infection, do not rule out co-infections with other pathogens, and should not be used as the sole basis for treatment or other patient management decisions. Negative results must be combined with clinical observations, patient history, and epidemiological information. The expected result is Negative.  Fact Sheet for  Patients: SugarRoll.be  Fact Sheet for Healthcare Providers: https://www.woods-mathews.com/  This test is not yet approved or cleared by the [place] FDA and  has been authorized for detection and/or diagnosis of SARS-CoV-2 by FDA under an Emergency Use Authorization (EUA). This EUA will remain  in effect (meaning this test can be used) for the duration of the COVID-19 declaration under Se ction 564(b)(1) of the Act, 21 U.S.C. section 360bbb-3(b)(1), unless the authorization is terminated or revoked sooner.  Performed at [facility], [place] [address] [place], [place] [address]          Radiology Studies: CT Head Wo Contrast  Result Date: 11/05/2020 CLINICAL DATA:  Fall,  posterior head strike EXAM: CT HEAD WITHOUT CONTRAST TECHNIQUE: Contiguous axial images were obtained from the base of the skull through the vertex without intravenous contrast. COMPARISON:  CT 10/21/2016 FINDINGS: Brain: There is some new trace hyperdense thickening along the posterior falx (3/22) concerning for trace subdural blood. Finding is however on a background of some diffuse hyperattenuating mineralization along the falx and tentorium though the remainder of which appears stable from comparison. No additional sites concerning for acute intracranial hemorrhage. No other extra-axial collection is seen. No mass effect or midline shift. No CT evident areas of infarction. Patchy areas of white matter hypoattenuation are most compatible with chronic microvascular angiopathy. Symmetric prominence of the ventricles, cisterns and sulci compatible with parenchymal volume loss. Vascular: Atherosclerotic calcification of the carotid siphons and intradural vertebral arteries. No hyperdense vessel. Skull: There are chronic areas of right occipital scarring with new areas of soft tissue thickening along the posterior scalp. No subjacent calvarial fracture is seen. No other acute osseous abnormalities or worrisome osseous lesions. Sinuses/Orbits: Paranasal sinuses and mastoid air cells are predominantly clear. Included orbital structures are unremarkable. Other: None IMPRESSION: 1. New trace hyperdense thickening along the posterior falx (3/22) concerning for trace subdural blood in the setting of fall with head strike. Finding is however on a background of some diffuse hyperattenuating mineralization along the falx and tentorium though the remainder of which appears stable from comparison. Could consider short-term (6 hour) follow-up imaging to assess for stability. 2. Chronic areas of right occipital scarring with new areas of soft tissue thickening along the posterior scalp. No subjacent calvarial fracture. Correlate  for point tenderness. 3. Chronic microvascular angiopathy and parenchymal volume loss. Intracranial atherosclerosis. Critical Value/emergent results were called by telephone at the time of interpretation on 11/05/2020 at 1:18 am to provider [facility], who verbally acknowledged these results. Electronically Signed   By: Lovena Le M.D.   On: 11/05/2020 01:19   DG Chest Port 1 View  Result Date: 11/05/2020 CLINICAL DATA:  Preoperative examination EXAM: PORTABLE CHEST 1 VIEW COMPARISON:  10/16/2020 FINDINGS: In unchanged moderate cardiomegaly with calcific atherosclerosis. Multiple old right-sided rib fractures. Remote median sternotomy. No focal airspace consolidation or pulmonary edema. IMPRESSION: Unchanged moderate cardiomegaly without focal airspace disease. Electronically Signed   By: Ulyses Jarred M.D.   On: 11/05/2020 02:24   DG Hip Unilat W or Wo Pelvis 2-3 Views Left  Result Date: 11/04/2020 CLINICAL DATA:  Fall EXAM: DG HIP (WITH OR WITHOUT PELVIS) 2-3V LEFT COMPARISON:  CT 10/16/2020, 12/08/2015 FINDINGS: SI joints are non widened. Extensive vascular calcifications. Pubic symphysis and rami appear intact. Right femoral head projects in joint. Intramedullary rod and distal fixating screw within the left femur. Suspected acute nondisplaced fracture involving the greater trochanter of the femur. IMPRESSION: Suspected acute nondisplaced fracture involving the greater  trochanter of the left femur. Electronically Signed   By: Donavan Foil M.D.   On: 11/04/2020 23:16        Scheduled Meds: . allopurinol  100 mg Oral Daily  . bictegravir-emtricitabine-tenofovir AF  1 tablet Oral Daily  . carvedilol  3.125 mg Oral BID  . Chlorhexidine Gluconate Cloth  6 each Topical Q0600  . folic acid  1 mg Oral Daily  . gabapentin  300 mg Oral BID  . insulin aspart  0-15 Units Subcutaneous TID AC & HS  . isosorbide mononitrate  30 mg Oral Daily  . latanoprost  1 drop Both Eyes QHS  . multivitamin with  minerals  1 tablet Oral Daily  . pantoprazole  40 mg Oral BID AC  . sevelamer carbonate  800 mg Oral TID WC  . thiamine  100 mg Oral Daily   Or  . thiamine  100 mg Intravenous Daily  . timolol  1 drop Both Eyes BH-q7a  . umeclidinium-vilanterol  1 puff Inhalation Daily   Continuous Infusions:        Aline August, MD [facility] 11/06/2020, 12:09 PM

## 2020-11-06 NOTE — Progress Notes (Addendum)
[facility] Progress Note   Subjective:   Patient seen and examined at bedside.  Reports he is going home today.  States head continues to "ache a little but seems better" and his hip feels about the same.  Reports BP drop at dialysis, believes he may have gained weight.  Denies CP, SOB, n/v/d, abdominal pain, weakness and fatigue   Objective Vitals:   11/05/20 1039 11/05/20 1722 11/05/20 2024 11/06/20 0642  BP: (!) 150/81 131/66 120/73 (!) 148/74  Pulse: 93 81 89 91  Resp: 16 17 16 18   Temp: (!) 97.5 F (36.4 C) 98.2 F (36.8 C) 98.2 F (36.8 C) 98.2 F (36.8 C)  TempSrc: Oral Oral Oral Oral  SpO2: 100% 96% 100% 100%   Physical Exam General:thin male in NAD Heart:RRR, no mrg Lungs:CTAB, nml WOB Abdomen:soft, NTND Extremities:no LE edema Dialysis Access: AVF +b   There were no vitals filed for this visit.  Intake/Output Summary (Last 24 hours) at 11/06/2020 1025 Last data filed at 11/06/2020 0745 Gross per 24 hour  Intake 540 ml  Output 1 ml  Net 539 ml    Additional Objective Labs: Basic Metabolic Panel: Recent Labs  Lab 11/05/20 0212 11/06/20 0310  NA 134* 137  K 4.1 3.7  CL 92* 94*  CO2 28 28  GLUCOSE 147* 115*  BUN 31* 50*  CREATININE 5.05* 6.41*  CALCIUM 7.9* 8.4*  PHOS  --  5.8*   Liver Function Tests: Recent Labs  Lab 11/06/20 0310  AST 19  ALT 13  ALKPHOS 100  BILITOT 0.6  PROT 7.5  ALBUMIN 3.0*   CBC: Recent Labs  Lab 11/05/20 0212 11/06/20 0310  WBC 7.9 6.3  NEUTROABS 5.7 3.9  HGB 10.4* 9.8*  HCT 33.6* 31.8*  MCV 103.1* 102.9*  PLT 164 172   BCBG: Recent Labs  Lab 11/05/20 0839 11/05/20 1204 11/05/20 1612 11/05/20 2203 11/06/20 0906  GLUCAP 134* 128* 138* 175* 188*   Studies/Results: CT Head Wo Contrast  Result Date: 11/05/2020 CLINICAL DATA:  Fall, posterior head strike EXAM: CT HEAD WITHOUT CONTRAST TECHNIQUE: Contiguous axial images were obtained from the base of the skull through the vertex without  intravenous contrast. COMPARISON:  CT 10/21/2016 FINDINGS: Brain: There is some new trace hyperdense thickening along the posterior falx (3/22) concerning for trace subdural blood. Finding is however on a background of some diffuse hyperattenuating mineralization along the falx and tentorium though the remainder of which appears stable from comparison. No additional sites concerning for acute intracranial hemorrhage. No other extra-axial collection is seen. No mass effect or midline shift. No CT evident areas of infarction. Patchy areas of white matter hypoattenuation are most compatible with chronic microvascular angiopathy. Symmetric prominence of the ventricles, cisterns and sulci compatible with parenchymal volume loss. Vascular: Atherosclerotic calcification of the carotid siphons and intradural vertebral arteries. No hyperdense vessel. Skull: There are chronic areas of right occipital scarring with new areas of soft tissue thickening along the posterior scalp. No subjacent calvarial fracture is seen. No other acute osseous abnormalities or worrisome osseous lesions. Sinuses/Orbits: Paranasal sinuses and mastoid air cells are predominantly clear. Included orbital structures are unremarkable. Other: None IMPRESSION: 1. New trace hyperdense thickening along the posterior falx (3/22) concerning for trace subdural blood in the setting of fall with head strike. Finding is however on a background of some diffuse hyperattenuating mineralization along the falx and tentorium though the remainder of which appears stable from comparison. Could consider short-term (6 hour) follow-up imaging to  assess for stability. 2. Chronic areas of right occipital scarring with new areas of soft tissue thickening along the posterior scalp. No subjacent calvarial fracture. Correlate for point tenderness. 3. Chronic microvascular angiopathy and parenchymal volume loss. Intracranial atherosclerosis. Critical Value/emergent results were  called by telephone at the time of interpretation on 11/05/2020 at 1:18 am to provider [facility], who verbally acknowledged these results. Electronically Signed   By: Lovena Le M.D.   On: 11/05/2020 01:19   DG Chest Port 1 View  Result Date: 11/05/2020 CLINICAL DATA:  Preoperative examination EXAM: PORTABLE CHEST 1 VIEW COMPARISON:  10/16/2020 FINDINGS: In unchanged moderate cardiomegaly with calcific atherosclerosis. Multiple old right-sided rib fractures. Remote median sternotomy. No focal airspace consolidation or pulmonary edema. IMPRESSION: Unchanged moderate cardiomegaly without focal airspace disease. Electronically Signed   By: Ulyses Jarred M.D.   On: 11/05/2020 02:24   DG Hip Unilat W or Wo Pelvis 2-3 Views Left  Result Date: 11/04/2020 CLINICAL DATA:  Fall EXAM: DG HIP (WITH OR WITHOUT PELVIS) 2-3V LEFT COMPARISON:  CT 10/16/2020, 12/08/2015 FINDINGS: SI joints are non widened. Extensive vascular calcifications. Pubic symphysis and rami appear intact. Right femoral head projects in joint. Intramedullary rod and distal fixating screw within the left femur. Suspected acute nondisplaced fracture involving the greater trochanter of the femur. IMPRESSION: Suspected acute nondisplaced fracture involving the greater trochanter of the left femur. Electronically Signed   By: Donavan Foil M.D.   On: 11/04/2020 23:16    Medications:  . allopurinol  100 mg Oral Daily  . bictegravir-emtricitabine-tenofovir AF  1 tablet Oral Daily  . carvedilol  3.125 mg Oral BID  . Chlorhexidine Gluconate Cloth  6 each Topical Q0600  . folic acid  1 mg Oral Daily  . gabapentin  300 mg Oral BID  . insulin aspart  0-15 Units Subcutaneous TID AC & HS  . isosorbide mononitrate  30 mg Oral Daily  . latanoprost  1 drop Both Eyes QHS  . multivitamin with minerals  1 tablet Oral Daily  . pantoprazole  40 mg Oral BID AC  . sevelamer carbonate  800 mg Oral TID WC  . thiamine  100 mg Oral Daily   Or  . thiamine  100  mg Intravenous Daily  . timolol  1 drop Both Eyes BH-q7a  . umeclidinium-vilanterol  1 puff Inhalation Daily    Dialysis Orders: [facility] - MWF 4hrs 400/800, edw 78kg, 2K 2Ca AVF Sensipar 90qHD venofer 50mg  qwk mircera 157mcg q2wks - last 1/12 Calcitriol 3.61mcg qHD NO heparin  Assessment/Plan: 1. Small subdural hematoma - s/p fall, still with mild HA.  Per neurosurgery no repeat imaging needed. 2. L greater trochanter Frx - ortho eval, non surgical management 3. ESRD - on HD MWF.  HD today per regular schedule.  4. Anemia of CKD- Hgb 9.8, ESA just dosed. Hx GIB. 5. Secondary hyperparathyroidism - Ca in goal, phos close to goal. Continue binders, VDRA. 6. HTN/volume - BP mildly elevated.  Expect improvement post HD.  Continue home meds.  Does not appear volume overloaded.  May need to increase EDW 0.5kg.  7. Nutrition - Renal diet w/fluid restrictions.   Jen Mow, PA-C [name] 11/06/2020,10:25 AM  LOS: 0 days   Pt seen, examined and agree w assess/plan as above with additions as indicated. Make sure no IV heparin w/ HD for next 1-2 months.  [facility] 11/06/2020, 1:16 PM

## 2020-11-06 NOTE — Evaluation (Signed)
Physical Therapy Evaluation Patient Details Name: Jacob [name] MRN: [id number] DOB: 11-Feb-1951 Today's Date: 11/06/2020   History of Present Illness  70 y.o. male with history of HIV, ESRD on HD MWF, CAD s/p CABG 2004-s/p DES 83/1517, chronic systolic heart failure, DM-2, HTN, HLD, multiple GI bleeds due to gastric AVMs, EtOH use, and R foot fx,-who presented to the ED with a history of fall approximately 2 days prior to this hospitalization-and ongoing headache and left hip pain.  Upon further evaluation-found to have a small subdural hematoma, and left nondisplaced greater trochanter fracture.  Clinical Impression  PTA, patient lives alone and reports independence with no AD. Patient requires minA for OOB mobility with SPC due to impaired balance and tendency to catch L foot on R CAM boot. Patient negotiated flight of stairs with L rail and SPC and minA throughout for balance and sequencing. Patient with decreased safety awareness and decreased awareness of deficits. Encouraged patient to use RW for stability and support, patient refused during session. Patient presents with decreased activity tolerance, impaired balance, generalized weakness, and impaired functional mobility. Patient agitated when discussing d/c planning and recommendation due to patient having to negotiate 14 stairs by himself to allow HHPT to access home, patient states "let me run my house". Recommend SNF and 24 hours assistance/supervision following discharge.     Follow Up Recommendations SNF;Supervision/Assistance - 24 hour (Will likely refuse, if so, HHPT and 24 hour S/A recommended)    Equipment Recommendations  Rolling Anibal Quinby with 5" wheels    Recommendations for Other Services       Precautions / Restrictions Precautions Precautions: Fall Precaution Comments: CAM boot R foot since 07/2020 for 5th metatarsal fx Restrictions Weight Bearing Restrictions: Yes LLE Weight Bearing: Weight bearing as tolerated       Mobility  Bed Mobility Overal bed mobility: Needs Assistance Bed Mobility: Supine to Sit     Supine to sit: Supervision     General bed mobility comments: supervision for safety, use of bed rails    Transfers Overall transfer level: Needs assistance Equipment used: Straight cane Transfers: Sit to/from Stand Sit to Stand: Supervision            Ambulation/Gait Ambulation/Gait assistance: Min assist Gait Distance (Feet): 100 Feet Assistive device: Straight cane Gait Pattern/deviations: Step-to pattern;Decreased stride length;Drifts right/left;Scissoring Gait velocity: decreased   General Gait Details: Patient refused use of RW. Patient with mild scissoring, drifting L/R, and L foot catching on R CAM boot. MinA to maintain balance throughout ambulation, unaware of need for assistance.  Stairs Stairs: Yes Stairs assistance: Min assist Stair Management: One rail Left;Step to pattern;Forwards;With cane Number of Stairs: 12 General stair comments: Cues for sequencing with poor follow through. MinA required for balance. L hip adduction with descending stairs. Unaware of need for assistance. Decreased safety awareness.  Wheelchair Mobility    Modified Rankin (Stroke Patients Only)       Balance Overall balance assessment: Needs assistance Sitting-balance support: No upper extremity supported;Feet supported Sitting balance-Leahy Scale: Fair     Standing balance support: Single extremity supported;During functional activity Standing balance-Leahy Scale: Poor Standing balance comment: requires minA for balance with ambulation                             Pertinent Vitals/Pain Pain Assessment: Faces Faces Pain Scale: Hurts a little bit Pain Location: L hip Pain Descriptors / Indicators: Grimacing Pain Intervention(s): Monitored during session  Home Living Family/patient expects to be discharged to:: Private residence Living Arrangements:  Alone Available Help at Discharge: Family;Friend(s);Available PRN/intermittently Type of Home: Apartment ("condo" - 2nd floor) Home Access: Stairs to enter Entrance Stairs-Rails: Left Entrance Stairs-Number of Steps: 14   Home Equipment: Tub bench;Cane - single point      Prior Function Level of Independence: Independent         Comments: has been wearing CAM boot since 07/2020     Hand Dominance        Extremity/Trunk Assessment   Upper Extremity Assessment Upper Extremity Assessment: Defer to OT evaluation    Lower Extremity Assessment Lower Extremity Assessment: Generalized weakness       Communication   Communication: No difficulties  Cognition Arousal/Alertness: Awake/alert Behavior During Therapy: Agitated Overall Cognitive Status: Impaired/Different from baseline Area of Impairment: Safety/judgement                         Safety/Judgement: Decreased awareness of safety;Decreased awareness of deficits            General Comments      Exercises     Assessment/Plan    PT Assessment Patient needs continued PT services  PT Problem List Decreased strength;Decreased activity tolerance;Decreased balance;Decreased mobility;Decreased coordination;Decreased safety awareness       PT Treatment Interventions DME instruction;Gait training;Stair training;Functional mobility training;Therapeutic activities;Therapeutic exercise;Balance training;Patient/family education    PT Goals (Current goals can be found in the Care Plan section)  Acute Rehab PT Goals Patient Stated Goal: to get out of here PT Goal Formulation: With patient Time For Goal Achievement: 11/20/20 Potential to Achieve Goals: Fair    Frequency Min 3X/week   Barriers to discharge Decreased caregiver support      Co-evaluation               AM-PAC PT "6 Clicks" Mobility  Outcome Measure Help needed turning from your back to your side while in a flat bed without using  bedrails?: A Little Help needed moving from lying on your back to sitting on the side of a flat bed without using bedrails?: A Little Help needed moving to and from a bed to a chair (including a wheelchair)?: A Little Help needed standing up from a chair using your arms (e.g., wheelchair or bedside chair)?: A Little Help needed to walk in hospital room?: A Little Help needed climbing 3-5 steps with a railing? : A Little 6 Click Score: 18    End of Session Equipment Utilized During Treatment: Gait belt;Other (comment) (CAM boot on R foot) Activity Tolerance: Patient tolerated treatment well Patient left: in bed;with call bell/phone within reach;with bed alarm set Nurse Communication: Mobility status PT Visit Diagnosis: Unsteadiness on feet (R26.81);Other abnormalities of gait and mobility (R26.89);Muscle weakness (generalized) (M62.81);History of falling (Z91.81)    Time: 3762-8315 PT Time Calculation (min) (ACUTE ONLY): 35 min   Charges:   PT Evaluation $PT Eval Low Complexity: 1 Low PT Treatments $Gait Training: 8-22 mins        Tawnee Clegg A. Gilford Rile PT, DPT Acute Rehabilitation Services Pager [phone] Office [phone]   Alda Lea 11/06/2020, 9:25 AM

## 2020-11-07 DIAGNOSIS — I2510 Atherosclerotic heart disease of native coronary artery without angina pectoris: Secondary | ICD-10-CM | POA: Diagnosis not present

## 2020-11-07 DIAGNOSIS — N186 End stage renal disease: Secondary | ICD-10-CM | POA: Diagnosis not present

## 2020-11-07 DIAGNOSIS — I5042 Chronic combined systolic (congestive) and diastolic (congestive) heart failure: Secondary | ICD-10-CM | POA: Diagnosis not present

## 2020-11-07 DIAGNOSIS — W19XXXA Unspecified fall, initial encounter: Secondary | ICD-10-CM | POA: Diagnosis not present

## 2020-11-07 LAB — GLUCOSE, CAPILLARY: Glucose-Capillary: 160 mg/dL — ABNORMAL HIGH (ref 70–99)

## 2020-11-07 MED ORDER — TICAGRELOR 90 MG PO TABS
90.0000 mg | ORAL_TABLET | Freq: Two times a day (BID) | ORAL | Status: DC
  Administered 2020-11-07: 90 mg via ORAL
  Filled 2020-11-07 (×2): qty 1

## 2020-11-07 MED ORDER — THIAMINE HCL 100 MG PO TABS: 100.0000 mg | ORAL_TABLET | Freq: Every day | ORAL | 0 refills | Status: DC

## 2020-11-07 MED ORDER — OXYCODONE-ACETAMINOPHEN 5-325 MG PO TABS: 1.0000 | ORAL_TABLET | ORAL | 0 refills | Status: DC | PRN

## 2020-11-07 MED ORDER — POLYETHYLENE GLYCOL 3350 17 G PO PACK: 17.0000 g | PACK | Freq: Every day | ORAL | 0 refills | Status: DC | PRN

## 2020-11-07 MED ORDER — FOLIC ACID 1 MG PO TABS: 1.0000 mg | ORAL_TABLET | Freq: Every day | ORAL | 0 refills | Status: DC

## 2020-11-07 NOTE — Progress Notes (Signed)
Discharge instructions addressed; Pt in stable condition; Family member picked him up at the [employer] entrance.

## 2020-11-07 NOTE — Progress Notes (Signed)
Occupational Therapy Evaluation Patient Details Name: Jacob [name] MRN: [id number] DOB: Dec 22, 1950 Today's Date: 11/07/2020    History of Present Illness 70 y.o. male with history of HIV, ESRD on HD MWF, CAD s/p CABG 2004-s/p DES 76/7209, chronic systolic heart failure, DM-2, HTN, HLD, multiple GI bleeds due to gastric AVMs, EtOH use, and R foot fx,-who presented to the ED with a history of fall approximately 2 days prior to this hospitalization-and ongoing headache and left hip pain.  Upon further evaluation-found to have a small subdural hematoma, and left nondisplaced greater trochanter fracture.   Clinical Impression   Prior to hospitalization, pt was independent with ADLs/ADL mobility/IADLs without mobility device. Pt lives alone in a 2nd level apartment with 14 STE and unilateral railing. Pt reports having PRN S/A from friends/family to perform more complex IADLs (e.g. driving, shopping). Today, pt received semi-reclined in bed, pt agreeable to OT eval. Pt presents with adequate strength/ROM to all four extremities and appears to be Ox4. Pt did not c/o of pain this date. Currently, pt performs bed mobility with Mod I, requires min guard-supervision for functional mobility/transfers using SPC, Mod I-supervision for LB and UB self-care. Educated pt on R CAM boot management/wearing schedule, fall prevention, activity pacing, energy conservation, AE/DME, and safety awareness using SPC for support. Pt reports having PRN S/A from friends/family upon d/c home. Please see recs below, will continue to follow pt in house as able. Pt would benefit from continued skilled acute care OT services to maximize ADLs/ADL mobility and ensure safety.       Follow Up Recommendations  Home health OT;Supervision/Assistance - 24 hour    Equipment Recommendations  Other (comment) (has all necessary DME/AE)    Recommendations for Other Services Other (comment) (None)     Precautions / Restrictions  Precautions Precautions: Fall Precaution Comments: CAM boot R foot since 07/2020 for 5th metatarsal fx Restrictions Weight Bearing Restrictions: Yes LLE Weight Bearing: Weight bearing as tolerated      Mobility Bed Mobility Overal bed mobility: Modified Independent Bed Mobility: Rolling;Supine to Sit Rolling: Modified independent (Device/Increase time)   Supine to sit: Modified independent (Device/Increase time)     General bed mobility comments: Mod I using bed railing    Transfers Overall transfer level: Needs assistance Equipment used: Straight cane Transfers: Sit to/from Stand Sit to Stand: Supervision         General transfer comment: sit>stand from bed with supervision, ambulated from bed>bathroom>chair with supervision-min guard using SPC for support    Balance Overall balance assessment: Needs assistance Sitting-balance support: No upper extremity supported;Feet supported Sitting balance-Leahy Scale: Good     Standing balance support: Single extremity supported;During functional activity Standing balance-Leahy Scale: Fair Standing balance comment: min guard-supervision for functional ADL mobility using cane       ADL either performed or assessed with clinical judgement   ADL Overall ADL's : Needs assistance/impaired Eating/Feeding: Independent   Grooming: Wash/dry hands;Wash/dry face;Oral care;Supervision/safety;Min guard;Standing Grooming Details (indicate cue type and reason): stood at sink to perform grooming activities with min guard-supervision/safety using cane for support Upper Body Bathing: Supervision/ safety;Set up   Lower Body Bathing: Min guard;Sitting/lateral leans;Sit to/from stand (with grab bars)   Upper Body Dressing : Modified independent   Lower Body Dressing: Modified independent;Sitting/lateral leans;Sit to/from stand Lower Body Dressing Details (indicate cue type and reason): able to don bilateral socks and R CAM boot with Mod I  sitting EOB Toilet Transfer: Supervision/safety;Min guard;Ambulation;Comfort height toilet;Grab bars (with SPC)  Toileting- Clothing Manipulation and Hygiene: Modified independent;Sitting/lateral lean;Sit to/from stand   Scientist, research (medical): Supervision/safety;Min guard;Ambulation;Tub bench;Grab bars (with SPC)   Functional mobility during ADLs: Min guard;Supervision/safety;Cane       Vision Baseline Vision/History: Wears glasses Wears Glasses: At all times Patient Visual Report: No change from baseline Vision Assessment?: No apparent visual deficits     Perception Perception Perception Tested?: No   Praxis Praxis Praxis tested?: Not tested    Pertinent Vitals/Pain Pain Assessment: No/denies pain Pain Location: L hip Pain Descriptors / Indicators: Discomfort Pain Intervention(s): Monitored during session;Repositioned     Hand Dominance Right   Extremity/Trunk Assessment Upper Extremity Assessment Upper Extremity Assessment: Overall WFL for tasks assessed   Lower Extremity Assessment Lower Extremity Assessment: Defer to PT evaluation   Cervical / Trunk Assessment Cervical / Trunk Assessment: Normal   Communication Communication Communication: No difficulties   Cognition Arousal/Alertness: Awake/alert Behavior During Therapy: WFL for tasks assessed/performed Overall Cognitive Status: No family/caregiver present to determine baseline cognitive functioning Area of Impairment: Safety/judgement     Safety/Judgement: Decreased awareness of safety;Decreased awareness of deficits     General Comments: seems to be improving with safety management and understands reasoning for wearing CAM boot on R foot; not fully aware of severity of deficits   General Comments  skin intact, R CAM boot in place     Home Living Family/patient expects to be discharged to:: Private residence Living Arrangements: Alone Available Help at Discharge: Family;Friend(s);Available  PRN/intermittently Type of Home: Apartment (2nd floor "condo") Home Access: Stairs to enter Entrance Stairs-Number of Steps: 14 Entrance Stairs-Rails: Left       Bathroom Shower/Tub: Teacher, early years/pre: Standard (with a toilet riser attached) Bathroom Accessibility: Yes How Accessible:  (walker and cane) Home Equipment: Tub bench;Cane - single point          Prior Functioning/Environment Level of Independence: Independent        Comments: has been wearing CAM boot since 07/2020        OT Problem List: Decreased activity tolerance;Impaired balance (sitting and/or standing);Decreased safety awareness      OT Treatment/Interventions: Self-care/ADL training;Therapeutic exercise;Neuromuscular education;Energy conservation;Therapeutic activities    OT Goals(Current goals can be found in the care plan section) Acute Rehab OT Goals Patient Stated Goal:  (return home) OT Goal Formulation: With patient Time For Goal Achievement: 11/21/20 Potential to Achieve Goals: Good  OT Frequency: Min 2X/week    AM-PAC OT "6 Clicks" Daily Activity     Outcome Measure Help from another person eating meals?: None Help from another person taking care of personal grooming?: A Little Help from another person toileting, which includes using toliet, bedpan, or urinal?: A Little Help from another person bathing (including washing, rinsing, drying)?: A Little Help from another person to put on and taking off regular upper body clothing?: None Help from another person to put on and taking off regular lower body clothing?: None 6 Click Score: 21   End of Session Equipment Utilized During Treatment: Gait belt;Other (comment) [name]) Nurse Communication: Mobility status  Activity Tolerance: Patient tolerated treatment well Patient left: in chair;with call bell/phone within reach;with bed alarm set;with nursing/sitter in room  OT Visit Diagnosis: Unsteadiness on feet (R26.81);Muscle  weakness (generalized) (M62.81);History of falling (Z91.81)                Time: 9381-8299 OT Time Calculation (min): 23 min Charges:  OT General Charges $OT Visit: 1 Visit OT Evaluation $OT Eval Moderate Complexity:  1 Mod OT Treatments $Self Care/Home Management : 8-22 mins  [name], OTR/L [facility] [place] 11/07/2020, 10:24 AM

## 2020-11-07 NOTE — Progress Notes (Signed)
Patient awake sitting in recliner.

## 2020-11-07 NOTE — TOC Transition Note (Signed)
Transition of Care [facility]) - CM/SW Discharge Note   Patient Details  Name: EULA MAZZOLA MRN: [id number] Date of Birth: [birth year]  [facility]) CM/SW Contact:  Bartholomew Crews, RN Phone Number: [phone] 11/07/2020, 11:12 AM   Clinical Narrative:     Spoke with patient at the bedside to discuss transition plans. Patient was dressed and sitting up in the chair. He stated that he was awaiting his pastor to pick him and transport home. Verified demographics in Epic. Encouraged follow up with PCP. Stated that he uses "[facility]" for dialysis transportation. He stated that he has a home care agency. Discussed recommendations for [facility] therapy, patient declined stating that he doesn't need it. Asked about RW, he stated that he has his cane. No further TOC needs identified.   Final next level of care: Home/Self Care Barriers to Discharge: No Barriers Identified   Patient Goals and CMS Choice Patient states their goals for this hospitalization and ongoing recovery are:: return home CMS Medicare.gov Compare Post Acute Care list provided to:: Patient Choice offered to / list presented to : Patient  Discharge Placement                       Discharge Plan and Services                DME Arranged: N/A DME Agency: NA       HH Arranged: Refused HH [place] Agency: NA        Social Determinants of Health (SDOH) Interventions     Readmission Risk Interventions Readmission Risk Prevention Plan 07/22/2019  Transportation Screening Complete  Medication Review Press photographer) Complete  HRI or [place] Complete  SW Recovery Care/Counseling Consult Complete  [place] Not Applicable  Some recent data might be hidden

## 2020-11-07 NOTE — Progress Notes (Signed)
Subjective: HD yesterday on schedule tolerated, patient seen and examined up in chair at bedside, just finished working with occupational therapy noted for discharge home  Objective Vital signs in last 24 hours: Vitals:   11/06/20 2034 11/07/20 0500 11/07/20 0837 11/07/20 0840  BP: (!) 156/76 (!) 143/76  (!) 129/58  Pulse: 89 91  85  Resp: 17 17  16   Temp: 98.8 F (37.1 C) 98.7 F (37.1 C)  98.7 F (37.1 C)  TempSrc: Oral Oral  Oral  SpO2: 98% 98% 99% 99%  Weight:       Weight change:   Physical Exam General alert,:thin male  NAD Heart:RRR, no mrg Lungs:CTAB, nml WOB Abdomen:soft, NTND Extremities:no LE edema Dialysis Access:  Left arm AVF positive bruit     Dialysis Orders: [facility] - MWF 4hrs 400/800, edw 78kg, 2K 2Ca AVF Sensipar 90qHD venofer 50mg  qwk mircera 144mcg q2wks - last 1/12 Calcitriol 3.60mcg qHD NO heparin  Problem/Plan: 1. Small subdural hematoma - s/p fall, still with mild HA.  Per neurosurgery no repeat imaging needed.,  No heparin on dialysis for now.  Noted was resumed on Brilinta 2. L greater trochanter Frx - ortho eval, non surgical management 3. ESRD - on HD MWF.  HD yesterday per regular schedule.  4. Anemia of CKD- Hgb 9.8, ESA just dosed. Hx GIB. 5. Secondary hyperparathyroidism - Ca in goal, phos 5.8 continue binders, VDRA. 6. HTN/volume - BP this a.m. stable.   Yesterday slightly elevated improved with UF with HD continue home meds.  Does not appear volume overloaded.  May need to increase EDW 0.5kg.  7.HIV-on meds 8. Nutrition - Renal diet w/fluid restrictions.  Ernest Haber, PA-C [facility] [phone] 11/07/2020,10:10 AM  LOS: 1 day   Labs: Basic Metabolic Panel: Recent Labs  Lab 11/05/20 0212 11/06/20 0310  NA 134* 137  K 4.1 3.7  CL 92* 94*  CO2 28 28  GLUCOSE 147* 115*  BUN 31* 50*  CREATININE 5.05* 6.41*  CALCIUM 7.9* 8.4*  PHOS  --  5.8*   Liver Function Tests: Recent Labs  Lab 11/06/20 0310   AST 19  ALT 13  ALKPHOS 100  BILITOT 0.6  PROT 7.5  ALBUMIN 3.0*   No results for input(s): LIPASE, AMYLASE in the last 168 hours. No results for input(s): AMMONIA in the last 168 hours. CBC: Recent Labs  Lab 11/05/20 0212 11/06/20 0310  WBC 7.9 6.3  NEUTROABS 5.7 3.9  HGB 10.4* 9.8*  HCT 33.6* 31.8*  MCV 103.1* 102.9*  PLT 164 172   Cardiac Enzymes: No results for input(s): CKTOTAL, CKMB, CKMBINDEX, TROPONINI in the last 168 hours. CBG: Recent Labs  Lab 11/06/20 0906 11/06/20 1132 11/06/20 1803 11/06/20 2032 11/07/20 0647  GLUCAP 188* 146* 153* 178* 160*    Studies/Results: CT HEAD WO CONTRAST  Result Date: 11/06/2020 CLINICAL DATA:  Subdural hematoma.  Fall at home. EXAM: CT HEAD WITHOUT CONTRAST TECHNIQUE: Contiguous axial images were obtained from the base of the skull through the vertex without intravenous contrast. COMPARISON:  November 05, 2020. FINDINGS: Brain: No change and suspected trace subdural hemorrhage along the posterior falx (see series 5, image 47 and series 3, image 20). No new hemorrhage. No evidence of acute large vascular territory infarct. No abnormal mass effect. No midline shift. No hydrocephalus. No mass lesion. Vascular: Calcific atherosclerosis. Skull: No acute fracture. Sinuses/Orbits: Mild scattered ethmoid air cell mucosal thickening without air-fluid levels. Unremarkable orbits. Other: No mastoid effusions. IMPRESSION: No change in  suspected trace subdural hemorrhage along the posterior falx. No new hemorrhage. Electronically Signed   By: Margaretha Sheffield MD   On: 11/06/2020 20:04   Medications:  . allopurinol  100 mg Oral Daily  . bictegravir-emtricitabine-tenofovir AF  1 tablet Oral Daily  . carvedilol  3.125 mg Oral BID  . Chlorhexidine Gluconate Cloth  6 each Topical Q0600  . folic acid  1 mg Oral Daily  . gabapentin  300 mg Oral BID  . insulin aspart  0-15 Units Subcutaneous TID AC & HS  . isosorbide mononitrate  30 mg Oral Daily   . latanoprost  1 drop Both Eyes QHS  . multivitamin with minerals  1 tablet Oral Daily  . pantoprazole  40 mg Oral BID AC  . sevelamer carbonate  800 mg Oral TID WC  . thiamine  100 mg Oral Daily   Or  . thiamine  100 mg Intravenous Daily  . ticagrelor  90 mg Oral BID  . timolol  1 drop Both Eyes BH-q7a  . umeclidinium-vilanterol  1 puff Inhalation Daily

## 2020-11-07 NOTE — Discharge Summary (Signed)
Physician Discharge Summary  Jacob [name] YFV:[id number] DOB: [birth year] DOA: 11/04/2020  PCP: [name], NP  Admit date: 11/04/2020 Discharge date: 11/07/2020  Admitted From: Home Disposition: Home.  Patient refused SNF placement  Recommendations for Outpatient Follow-up:  1. Follow up with PCP in 1 week with repeat CBC/BMP 2. Outpatient follow-up with orthopedics and cardiology 3. Outpatient follow-up with dialysis unit as scheduled 4. Outpatient follow-up with ID as scheduled 5. Follow up in ED if symptoms worsen or new appear   Home Health: Home health PT/OT Equipment/Devices: None  Discharge Condition: Stable CODE STATUS: Full Diet recommendation: Heart healthy/carb modified/renal hemodialysis diet  Brief/Interim Summary: 70 year old male with history of HIV, end-stage renal disease on hemodialysis, CAD status post CABG in 2004 and DES in 10/6382, chronic systolic and diastolic heart failure, diabetes mellitus type 2, alcohol abuse, hypertension, hyperlipidemia, multiple GI bleeds due to gastric AVMs with last in late December 2021 presented with severe headache along with recent fall with subsequent left hip pain.  On presentation, he was found to have a small subdural hematoma for which neurosurgery recommended conservative management.  He was also found to have nondisplaced fracture involving the greater trochanter of left femur.  Orthopedics was consulted.  Orthopedics recommended conservative management with outpatient follow-up with orthopedics.  PT recommended SNF placement.  Patient is adamant that he will not go to SNF.  Repeat CT of the brain showed stable subdural small hematoma and neurosurgery cleared the patient for resumption of Brilinta.  He will be discharged home today with home health PT/OT.   Discharge Diagnoses:   Small subdural hematoma following a fall -CT imaging on presentation was reviewed by neurosurgery on-call who recommended conservative  management.  Brilinta on hold for now.  Communicated with Joelene Millin Meyran/NP for neurosurgery via secure chat on 11/06/2020 who recommended to repeat a CT scan of the brain and if no worsening/stable small subdural hematoma, Brilinta can be resumed.  CT of the brain repeated yesterday did not show any worsening. -Mental status currently stable.  Will resume Brilinta.  Outpatient follow-up.  Left femur greater trochanteric fracture -Conservative management as per orthopedics with outpatient follow-up with orthopedics. -PT recommended SNF placement. Patient is adamant that he will not go to SNF. - He will be discharged home today with home health PT/OT.  End-stage renal disease on hemodialysis -Nephrology following.  Dialysis as per nephrology schedule.  Outpatient follow-up with dialysis unit as scheduled  Fracture of right foot metatarsal -Apparently suffered several weeks back: Was being followed by podiatry: On cam boot  Chronic systolic and diastolic heart failure -Compensated.  Volume managed by dialysis.  CAD status post CABG in 2014 and DES in 11/2019 -Stable.  No angina.    [name] plan as above.  Outpatient follow-up with cardiology.  Hypertension -Blood pressure stable.  Continue Coreg, Imdur  HIV -Continue current antiretroviral regimen.  Outpatient follow-up with ID  Hyperlipidemia -Continue statin  Diabetes mellitus type 2 -Continue carb modified diet.  Outpatient follow-up  GERD -Continue PPI  History of GI bleeding due to AVMs -No history of recent hematochezia/melena.  Monitor  Tobacco abuse -Patient was counseled regarding the same by prior hospitalist.  Alcohol abuse -Currently on CIWA protocol. Continue multivitamin, thiamine and folic acid on discharge.   Discharge Instructions  Discharge Instructions    Ambulatory referral to Cardiology   Complete by: As directed    Diet - low sodium heart healthy   Complete by: As directed    Renal  hemodialysis   Diet Carb Modified   Complete by: As directed    Increase activity slowly   Complete by: As directed      Allergies as of 11/07/2020      Reactions   Augmentin [amoxicillin-pot Clavulanate] Diarrhea   Severe diarrhea   Amphetamines Other (See Comments)   Unknown reaction type      Medication List    STOP taking these medications   traMADol 50 MG tablet Commonly known as: ULTRAM     TAKE these medications   acetaminophen 650 MG CR tablet Commonly known as: TYLENOL Take 650 mg by mouth 3 (three) times daily.   allopurinol 100 MG tablet Commonly known as: ZYLOPRIM TAKE 1 TABLET(100 MG) BY MOUTH DAILY What changed:   how much to take  how to take this  when to take this  additional instructions   Anoro Ellipta 62.5-25 MCG/INH Aepb Generic drug: umeclidinium-vilanterol INHALE 1 PUFF BY MOUTH EVERY DAY What changed: See the new instructions.   B-D UF III MINI PEN NEEDLES 31G X 5 MM Misc Generic drug: Insulin Pen Needle USE FOUR TIMES DAILY   Biktarvy 50-200-25 MG Tabs tablet Generic drug: bictegravir-emtricitabine-tenofovir AF Take 1 tablet by mouth daily.   carvedilol 3.125 MG tablet Commonly known as: COREG Take 1 tablet (3.125 mg total) by mouth 2 (two) times daily.   colchicine 0.6 MG tablet Take 0.6-1.2 mg by mouth daily as needed (gout flare-up).   diclofenac Sodium 1 % Gel Commonly known as: VOLTAREN APPLY 2 GRAMS TOPICALLY TO THE AFFECTED AREA THREE TIMES DAILY AS NEEDED FOR PAIN What changed: See the new instructions.   folic acid 1 MG tablet Commonly known as: FOLVITE Take 1 tablet (1 mg total) by mouth daily. Start taking on: November 08, 2020   gabapentin 300 MG capsule Commonly known as: NEURONTIN Take 1 capsule (300 mg total) by mouth 2 (two) times daily.   insulin lispro 100 UNIT/ML KwikPen Commonly known as: HumaLOG KwikPen Inject 0.71ml (20 Units total) into the skin 3 (three) times daily as needed for high blood sugar  (above 150) What changed:   how much to take  how to take this  when to take this  reasons to take this  additional instructions   iron sucrose in sodium chloride 0.9 % 100 mL Iron Sucrose (Venofer)   isosorbide mononitrate 30 MG 24 hr tablet Commonly known as: IMDUR TAKE 1 TABLET BY MOUTH EVERY DAY What changed:   how much to take  how to take this  when to take this   latanoprost 0.005 % ophthalmic solution Commonly known as: XALATAN Place 1 drop into both eyes at bedtime.   MIRCERA IJ Mircera   multivitamin Tabs tablet Take 1 tablet by mouth daily.   nitroGLYCERIN 0.3 MG SL tablet Commonly known as: NITROSTAT ONE TABLET UNDER TONGUE AS NEEDED FOR CHEST PAIN What changed: See the new instructions.   OneTouch Delica Plus [id number] Misc Inject 1 Device as directed 3 (three) times daily. Dx: E11.40   OneTouch Ultra test strip Generic drug: glucose blood CHECK BLOOD SUGAR 3 TIMES  DAILY   oxyCODONE-acetaminophen 5-325 MG tablet Commonly known as: PERCOCET/ROXICET Take 1 tablet by mouth every 4 (four) hours as needed for severe pain.   pantoprazole 40 MG tablet Commonly known as: PROTONIX TAKE 1 TABLET(40 MG) BY MOUTH TWICE DAILY What changed: See the new instructions.   polyethylene glycol 17 g packet Commonly known as: MIRALAX / GLYCOLAX Take 17 g  by mouth daily as needed for mild constipation.   SENSIPAR PO Take by mouth.   sevelamer carbonate 800 MG tablet Commonly known as: RENVELA Take 800 mg by mouth 3 (three) times daily with meals.   thiamine 100 MG tablet Take 1 tablet (100 mg total) by mouth daily. Start taking on: November 08, 2020   ticagrelor 90 MG Tabs tablet Commonly known as: BRILINTA Take 90 mg by mouth 2 (two) times daily.   timolol 0.5 % ophthalmic solution Commonly known as: TIMOPTIC Place 1 drop into both eyes daily.   VITAMIN D BOOSTER PO Take 1 each by mouth 2 (two) times daily.       Follow-up Information     Marybelle Killings, MD Follow up in 3 week(s).   Specialty: Orthopedic Surgery Contact information: [address] [place] [address] [phone]              Allergies  Allergen Reactions  . Augmentin [Amoxicillin-Pot Clavulanate] Diarrhea    Severe diarrhea  . Amphetamines Other (See Comments)    Unknown reaction type    Consultations:  Orthopedics/nephrology.  Neurosurgery communicated by ED provider and subsequently via secure chat by me on 11/06/2020   Procedures/Studies: CT HEAD WO CONTRAST  Result Date: 11/06/2020 CLINICAL DATA:  Subdural hematoma.  Fall at home. EXAM: CT HEAD WITHOUT CONTRAST TECHNIQUE: Contiguous axial images were obtained from the base of the skull through the vertex without intravenous contrast. COMPARISON:  November 05, 2020. FINDINGS: Brain: No change and suspected trace subdural hemorrhage along the posterior falx (see series 5, image 47 and series 3, image 20). No new hemorrhage. No evidence of acute large vascular territory infarct. No abnormal mass effect. No midline shift. No hydrocephalus. No mass lesion. Vascular: Calcific atherosclerosis. Skull: No acute fracture. Sinuses/Orbits: Mild scattered ethmoid air cell mucosal thickening without air-fluid levels. Unremarkable orbits. Other: No mastoid effusions. IMPRESSION: No change in suspected trace subdural hemorrhage along the posterior falx. No new hemorrhage. Electronically Signed   By: Margaretha Sheffield MD   On: 11/06/2020 20:04   CT Head Wo Contrast  Result Date: 11/05/2020 CLINICAL DATA:  Fall, posterior head strike EXAM: CT HEAD WITHOUT CONTRAST TECHNIQUE: Contiguous axial images were obtained from the base of the skull through the vertex without intravenous contrast. COMPARISON:  CT 10/21/2016 FINDINGS: Brain: There is some new trace hyperdense thickening along the posterior falx (3/22) concerning for trace subdural blood. Finding is however on a background of some diffuse hyperattenuating  mineralization along the falx and tentorium though the remainder of which appears stable from comparison. No additional sites concerning for acute intracranial hemorrhage. No other extra-axial collection is seen. No mass effect or midline shift. No CT evident areas of infarction. Patchy areas of white matter hypoattenuation are most compatible with chronic microvascular angiopathy. Symmetric prominence of the ventricles, cisterns and sulci compatible with parenchymal volume loss. Vascular: Atherosclerotic calcification of the carotid siphons and intradural vertebral arteries. No hyperdense vessel. Skull: There are chronic areas of right occipital scarring with new areas of soft tissue thickening along the posterior scalp. No subjacent calvarial fracture is seen. No other acute osseous abnormalities or worrisome osseous lesions. Sinuses/Orbits: Paranasal sinuses and mastoid air cells are predominantly clear. Included orbital structures are unremarkable. Other: None IMPRESSION: 1. New trace hyperdense thickening along the posterior falx (3/22) concerning for trace subdural blood in the setting of fall with head strike. Finding is however on a background of some diffuse hyperattenuating mineralization along the falx and  tentorium though the remainder of which appears stable from comparison. Could consider short-term (6 hour) follow-up imaging to assess for stability. 2. Chronic areas of right occipital scarring with new areas of soft tissue thickening along the posterior scalp. No subjacent calvarial fracture. Correlate for point tenderness. 3. Chronic microvascular angiopathy and parenchymal volume loss. Intracranial atherosclerosis. Critical Value/emergent results were called by telephone at the time of interpretation on 11/05/2020 at 1:18 am to provider [facility], who verbally acknowledged these results. Electronically Signed   By: Lovena Le M.D.   On: 11/05/2020 01:19   DG Chest Port 1 View  Result Date:  11/05/2020 CLINICAL DATA:  Preoperative examination EXAM: PORTABLE CHEST 1 VIEW COMPARISON:  10/16/2020 FINDINGS: In unchanged moderate cardiomegaly with calcific atherosclerosis. Multiple old right-sided rib fractures. Remote median sternotomy. No focal airspace consolidation or pulmonary edema. IMPRESSION: Unchanged moderate cardiomegaly without focal airspace disease. Electronically Signed   By: Ulyses Jarred M.D.   On: 11/05/2020 02:24   DG Chest Port 1 View  Result Date: 10/16/2020 CLINICAL DATA:  Shortness of breath. EXAM: PORTABLE CHEST 1 VIEW COMPARISON:  Chest x-ray 03/11/2020 FINDINGS: Slightly more prominent cardiac silhouette likely due to AP portable technique. Otherwise the heart size and mediastinal contours are unchanged. Aortic arch calcifications. Marked calcifications of the major vessels originating from the aortic arch. Coronary artery stent. No focal consolidation. No pulmonary edema. No pleural effusion. No pneumothorax. No acute osseous abnormality. Old left rib fractures. Old right rib fractures. IMPRESSION: No active disease. Electronically Signed   By: Iven Finn M.D.   On: 10/16/2020 19:16   ECHOCARDIOGRAM COMPLETE  Result Date: 10/18/2020    ECHOCARDIOGRAM REPORT   Patient Name:   Jacob [name] Date of Exam: 10/18/2020 Medical Rec #:  [id number]       Height:       74.0 in Accession #:    [id number]      Weight:       149.4 lb Date of Birth:  [birth year]        BSA:          1.920 m Patient Age:    70 years        BP:           151/82 mmHg Patient Gender: M               HR:           81 bpm. Exam Location:  Inpatient Procedure: 2D Echo, Cardiac Doppler, Color Doppler and 3D Echo Indications:    Chest pain  History:        Patient has prior history of Echocardiogram examinations, most                 recent 02/07/2019. Cardiomyopathy, Previous Myocardial Infarction                 and CAD, Prior CABG; Risk Factors:Hypertension, Dyslipidemia,                 Diabetes and  Current Smoker. CKD. GERD. ETOH abuse.  Sonographer:    Clayton Lefort RDCS (AE) Referring Phys: [place]  1. Left ventricular ejection fraction, by estimation, is 40 to 45%. The left ventricle has mildly decreased function. The left ventricle demonstrates regional wall motion abnormalities (see scoring diagram/findings for description). The left ventricular  internal cavity size was moderately dilated. There is moderate concentric left ventricular hypertrophy. Left ventricular diastolic parameters are consistent with Grade II  diastolic dysfunction (pseudonormalization). Elevated left atrial pressure.  2. Right ventricular systolic function is normal. The right ventricular size is normal. There is normal pulmonary artery systolic pressure.  3. Left atrial size was moderately dilated.  4. The mitral valve is normal in structure. Mild mitral valve regurgitation. No evidence of mitral stenosis.  5. The aortic valve is normal in structure. Aortic valve regurgitation is not visualized. No aortic stenosis is present.  6. The inferior vena cava is dilated in size with >50% respiratory variability, suggesting right atrial pressure of 8 mmHg. FINDINGS  Left Ventricle: Left ventricular ejection fraction, by estimation, is 40 to 45%. The left ventricle has mildly decreased function. The left ventricle demonstrates regional wall motion abnormalities. The left ventricular internal cavity size was moderately dilated. There is moderate concentric left ventricular hypertrophy. Left ventricular diastolic parameters are consistent with Grade II diastolic dysfunction (pseudonormalization). Elevated left atrial pressure.  LV Wall Scoring: The mid and distal lateral wall, entire inferior wall, and posterior wall are hypokinetic. Right Ventricle: The right ventricular size is normal. No increase in right ventricular wall thickness. Right ventricular systolic function is normal. There is normal pulmonary  artery systolic pressure. Left Atrium: Left atrial size was moderately dilated. Right Atrium: Right atrial size was normal in size. Pericardium: There is no evidence of pericardial effusion. Mitral Valve: The mitral valve is normal in structure. Mild mitral valve regurgitation. No evidence of mitral valve stenosis. Tricuspid Valve: The tricuspid valve is normal in structure. Tricuspid valve regurgitation is not demonstrated. No evidence of tricuspid stenosis. Aortic Valve: The aortic valve is normal in structure. Aortic valve regurgitation is not visualized. No aortic stenosis is present. Aortic valve mean gradient measures 2.0 mmHg. Aortic valve peak gradient measures 4.1 mmHg. Aortic valve area, by VTI measures 3.79 cm. Pulmonic Valve: The pulmonic valve was normal in structure. Pulmonic valve regurgitation is not visualized. No evidence of pulmonic stenosis. Aorta: The aortic root is normal in size and structure. Venous: The inferior vena cava is dilated in size with greater than 50% respiratory variability, suggesting right atrial pressure of 8 mmHg. IAS/Shunts: No atrial level shunt detected by color flow Doppler.  LEFT VENTRICLE PLAX 2D LVIDd:         6.10 cm  Diastology LVIDs:         5.20 cm  LV e' medial:  5.87 cm/s LV PW:         1.60 cm  LV e' lateral: 5.87 cm/s LV IVS:        1.30 cm LVOT diam:     2.10 cm LV SV:         69 LV SV Index:   36 LVOT Area:     3.46 cm 3D Volume EF:                         3D EF:        30 %                         LV EDV:       314 ml                         LV ESV:       220 ml                         LV  SV:        94 ml RIGHT VENTRICLE             IVC RV Basal diam:  3.10 cm     IVC diam: 2.60 cm RV S prime:     10.90 cm/s TAPSE (M-mode): 1.4 cm LEFT ATRIUM             Index       RIGHT ATRIUM           Index LA diam:        4.70 cm 2.45 cm/m  RA Area:     17.10 cm LA Vol (A2C):   65.5 ml 34.11 ml/m RA Volume:   41.90 ml  21.82 ml/m LA Vol (A4C):   68.4 ml 35.62  ml/m LA Biplane Vol: 68.0 ml 35.42 ml/m  AORTIC VALVE AV Area (Vmax):    3.53 cm AV Area (Vmean):   3.16 cm AV Area (VTI):     3.79 cm AV Vmax:           101.00 cm/s AV Vmean:          69.700 cm/s AV VTI:            0.183 m AV Peak Grad:      4.1 mmHg AV Mean Grad:      2.0 mmHg LVOT Vmax:         103.00 cm/s LVOT Vmean:        63.500 cm/s LVOT VTI:          0.200 m LVOT/AV VTI ratio: 1.09  AORTA Ao Root diam: 3.50 cm Ao Asc diam:  3.20 cm  SHUNTS Systemic VTI:  0.20 m Systemic Diam: 2.10 cm Ena Dawley MD Electronically signed by Ena Dawley MD Signature Date/Time: 10/18/2020/3:18:18 PM    Final    CT Angio Chest/Abd/Pel for Dissection W and/or Wo Contrast  Result Date: 10/16/2020 CLINICAL DATA:  Abdominal pain, aortic dissection suspected. EXAM: CT ANGIOGRAPHY CHEST, ABDOMEN AND PELVIS TECHNIQUE: Non-contrast CT of the chest was initially obtained. Multidetector CT imaging through the chest, abdomen and pelvis was performed using the standard protocol during bolus administration of intravenous contrast. Multiplanar reconstructed images and MIPs were obtained and reviewed to evaluate the vascular anatomy. CONTRAST:  137mL OMNIPAQUE IOHEXOL 350 MG/ML SOLN COMPARISON:  CT abdomen pelvis 11/03/2015, CT abdomen pelvis 06/14/2006 FINDINGS: VASCULAR Aorta: Moderate to severe calcified and noncalcified atherosclerotic plaque. Normal caliber aorta without aneurysm, dissection, vasculitis or significant stenosis. Celiac: Mild atherosclerotic plaque. Patent without evidence of aneurysm, dissection, vasculitis or significant stenosis. SMA: Severe atherosclerotic plaque with severe narrowing of the origin. Patent without evidence of aneurysm, dissection, vasculitis. Renals: At least moderate atherosclerotic plaque with mild to moderate stenosis of the origins. Both renal arteries are patent without evidence of aneurysm, dissection, vasculitis, fibromuscular dysplasia or significant stenosis. IMA: Patent  without evidence of aneurysm, dissection, vasculitis or significant stenosis. Inflow: Moderate to severe atherosclerotic plaque. Patent without evidence of aneurysm, dissection, vasculitis or significant stenosis. Veins: No obvious venous abnormality within the limitations of this arterial phase study. Review of the MIP images confirms the above findings. NON-VASCULAR CTA CHEST FINDINGS Cardiovascular: Preferential opacification of the thoracic aorta. No evidence of thoracic aortic aneurysm or dissection. Normal heart size. No significant pericardial effusion. At least moderate four-vessel coronary artery calcifications status post coronary artery bypass. The main pulmonary artery is normal in caliber. No central pulmonary embolus. Mediastinum/Nodes: No enlarged mediastinal, hilar, or axillary lymph nodes. Thyroid gland, trachea,  and esophagus demonstrate no significant findings. Lungs/Pleura: Right lower lobe scattered peribronchovascular ground-glass airspace opacities (6:35, 47, 49). Nodular-like region within the right lower lobe measures up to 1.3 cm. Similar finding within left upper lobe (6:25). Nodular-ike region within the left upper lobe measures up to 7 mm. No pulmonary mass. No pleural effusion. No pneumothorax. Musculoskeletal: No chest wall abnormality No suspicious lytic or blastic osseous lesions. No acute displaced fracture. CTA ABDOMEN AND PELVIS FINDINGS Hepatobiliary: No focal liver abnormality is seen. Status post cholecystectomy. No biliary dilatation. Pancreas: No focal lesion. Normal pancreatic contour. No surrounding inflammatory changes. No main pancreatic ductal dilatation. Spleen: Normal in size without focal abnormality. Adrenals/Urinary Tract: No adrenal nodule bilaterally. Bilateral kidneys enhance symmetrically. Lobulated renal parenchyma with associated scarring and suggestion of innumerable cystic lesions. Suggestion of ill-defined underlying renal lesions of which measure simple  fluid (7:164, 161). Subcentimeter hypodensities are too small to characterize. No hydronephrosis. No hydroureter. The urinary bladder is unremarkable. Stomach/Bowel: Stomach is within normal limits. No evidence of bowel wall thickening or dilatation. Scattered sigmoid diverticulosis. Appendix appears normal. Lymphatic: No abdominal, pelvic, or inguinal lymphadenopathy. Reproductive: Prostate is unremarkable. Other: No intraperitoneal free fluid. No intraperitoneal free gas. No organized fluid collection. Musculoskeletal: Small fat containing left inguinal hernia. Possible tiny fat containing right inguinal hernia. No suspicious lytic or blastic osseous lesions. No acute displaced fracture. Old healed left rib fractures. Old healed left rib fractures. Multilevel degenerative changes of the spine with intervertebral disc space narrowing and vacuum phenomenon at the L4-L5 and L5-S1 levels. Trace retrolisthesis of L5 on S1. Review of the MIP images confirms the above findings. IMPRESSION: VASCULAR: 1. No aortic aneurysm or dissection. Aortic Atherosclerosis (ICD10-I70.0). 2. 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 NON-VASCULAR: 1. Vague peribronchovascular ground-glass airspace opacities, some of which are nodular-like, within the right lower lobe and left upper lobes. Findings could represent infection/inflammation with COVID 19 infection not excluded. Non-contrast chest CT at 3-6 months is recommended. If the nodules are stable at time of repeat CT, then future CT at 18-24 months (from today's scan) is considered optional for low-risk patients, but is recommended for high-risk patients. This recommendation follows the consensus statement: Guidelines for Management of Incidental Pulmonary Nodules Detected on CT Images: From the Fleischner Society 2017; Radiology 2017; 284:228-243. 2. Interval development of multiple renal cystic lesions  that are poorly visualized/evaluated. Recommend dedicated cross-sectional imaging renal protocol for further evaluation. 3. Scattered sigmoid diverticulosis with no acute diverticulitis. 4. Bilateral fat containing inguinal hernias. Electronically Signed   By: Iven Finn M.D.   On: 10/16/2020 21:10   DG Hip Unilat W or Wo Pelvis 2-3 Views Left  Result Date: 11/04/2020 CLINICAL DATA:  Fall EXAM: DG HIP (WITH OR WITHOUT PELVIS) 2-3V LEFT COMPARISON:  CT 10/16/2020, 12/08/2015 FINDINGS: SI joints are non widened. Extensive vascular calcifications. Pubic symphysis and rami appear intact. Right femoral head projects in joint. Intramedullary rod and distal fixating screw within the left femur. Suspected acute nondisplaced fracture involving the greater trochanter of the femur. IMPRESSION: Suspected acute nondisplaced fracture involving the greater trochanter of the left femur. Electronically Signed   By: Donavan Foil M.D.   On: 11/04/2020 23:16       Subjective: Patient seen and examined at bedside.  Awake, answers questions.  Poor historian.  Adamant that he wants to go home today.  Does not want to go to SNF at all.  Denies any worsening  headache, nausea, vomiting or shortness of breath.  Discharge Exam: Vitals:   11/07/20 0837 11/07/20 0840  BP:  (!) 129/58  Pulse:  85  Resp:  16  Temp:  98.7 F (37.1 C)  SpO2: 99% 99%    General: Pt is alert, awake, not in acute distress.  Poor historian Cardiovascular: rate controlled, S1/S2 + Respiratory: bilateral decreased breath sounds at bases with scattered crackles Abdominal: Soft, NT, ND, bowel sounds + Extremities: Trace lower extremity edema present; right foot Cam boot present.  No cyanosis    The results of significant diagnostics from this hospitalization (including imaging, microbiology, ancillary and laboratory) are listed below for reference.     Microbiology: Recent Results (from the past 240 hour(s))  SARS CORONAVIRUS 2  (TAT 6-24 HRS) Nasopharyngeal Nasopharyngeal Swab     Status: None   Collection Time: 11/05/20  2:12 AM   Specimen: Nasopharyngeal Swab  Result Value Ref Range Status   SARS Coronavirus 2 NEGATIVE NEGATIVE Final    Comment: (NOTE) SARS-CoV-2 target nucleic acids are NOT DETECTED.  The SARS-CoV-2 RNA is generally detectable in upper and lower respiratory specimens during the acute phase of infection. Negative results do not preclude SARS-CoV-2 infection, do not rule out co-infections with other pathogens, and should not be used as the sole basis for treatment or other patient management decisions. Negative results must be combined with clinical observations, patient history, and epidemiological information. The expected result is Negative.  Fact Sheet for Patients: SugarRoll.be  Fact Sheet for Healthcare Providers: https://www.woods-mathews.com/  This test is not yet approved or cleared by the [place] FDA and  has been authorized for detection and/or diagnosis of SARS-CoV-2 by FDA under an Emergency Use Authorization (EUA). This EUA will remain  in effect (meaning this test can be used) for the duration of the COVID-19 declaration under Se ction 564(b)(1) of the Act, 21 U.S.C. section 360bbb-3(b)(1), unless the authorization is terminated or revoked sooner.  Performed at [facility], [place] [address], [place], [place] [address]      Labs: BNP (last 3 results) No results for input(s): BNP in the last 8760 hours. Basic Metabolic Panel: Recent Labs  Lab 11/05/20 0212 11/06/20 0310  NA 134* 137  K 4.1 3.7  CL 92* 94*  CO2 28 28  GLUCOSE 147* 115*  BUN 31* 50*  CREATININE 5.05* 6.41*  CALCIUM 7.9* 8.4*  MG  --  2.3  PHOS  --  5.8*   Liver Function Tests: Recent Labs  Lab 11/06/20 0310  AST 19  ALT 13  ALKPHOS 100  BILITOT 0.6  PROT 7.5  ALBUMIN 3.0*   No results for input(s): LIPASE, AMYLASE in the last 168  hours. No results for input(s): AMMONIA in the last 168 hours. CBC: Recent Labs  Lab 11/05/20 0212 11/06/20 0310  WBC 7.9 6.3  NEUTROABS 5.7 3.9  HGB 10.4* 9.8*  HCT 33.6* 31.8*  MCV 103.1* 102.9*  PLT 164 172   Cardiac Enzymes: No results for input(s): CKTOTAL, CKMB, CKMBINDEX, TROPONINI in the last 168 hours. BNP: Invalid input(s): POCBNP CBG: Recent Labs  Lab 11/06/20 0906 11/06/20 1132 11/06/20 1803 11/06/20 2032 11/07/20 0647  GLUCAP 188* 146* 153* 178* 160*   D-Dimer No results for input(s): DDIMER in the last 72 hours. Hgb A1c Recent Labs    11/05/20 0212  HGBA1C 4.9   Lipid Profile No results for input(s): CHOL, HDL, LDLCALC, TRIG, CHOLHDL, LDLDIRECT in the last 72 hours. Thyroid function studies  No results for input(s): TSH, T4TOTAL, T3FREE, THYROIDAB in the last 72 hours.  Invalid input(s): FREET3 Anemia work up No results for input(s): VITAMINB12, FOLATE, FERRITIN, TIBC, IRON, RETICCTPCT in the last 72 hours. Urinalysis    Component Value Date/Time   COLORURINE YELLOW 10/16/2020 2136   APPEARANCEUR CLEAR 10/16/2020 2136   LABSPEC 1.016 10/16/2020 2136   PHURINE 5.0 10/16/2020 2136   GLUCOSEU NEGATIVE 10/16/2020 2136   GLUCOSEU NEG mg/dL 09/20/2010 2058   HGBUR SMALL (A) 10/16/2020 2136   HGBUR negative 05/31/2010 0948   BILIRUBINUR NEGATIVE 10/16/2020 2136   [place] NEGATIVE 10/16/2020 2136   PROTEINUR 30 (A) 10/16/2020 2136   UROBILINOGEN 0.2 06/24/2015 2336   NITRITE NEGATIVE 10/16/2020 2136   LEUKOCYTESUR NEGATIVE 10/16/2020 2136   Sepsis Labs Invalid input(s): PROCALCITONIN,  WBC,  LACTICIDVEN Microbiology Recent Results (from the past 240 hour(s))  SARS CORONAVIRUS 2 (TAT 6-24 HRS) Nasopharyngeal Nasopharyngeal Swab     Status: None   Collection Time: 11/05/20  2:12 AM   Specimen: Nasopharyngeal Swab  Result Value Ref Range Status   SARS Coronavirus 2 NEGATIVE NEGATIVE Final    Comment: (NOTE) SARS-CoV-2 target nucleic acids  are NOT DETECTED.  The SARS-CoV-2 RNA is generally detectable in upper and lower respiratory specimens during the acute phase of infection. Negative results do not preclude SARS-CoV-2 infection, do not rule out co-infections with other pathogens, and should not be used as the sole basis for treatment or other patient management decisions. Negative results must be combined with clinical observations, patient history, and epidemiological information. The expected result is Negative.  Fact Sheet for Patients: SugarRoll.be  Fact Sheet for Healthcare Providers: https://www.woods-mathews.com/  This test is not yet approved or cleared by the [place] FDA and  has been authorized for detection and/or diagnosis of SARS-CoV-2 by FDA under an Emergency Use Authorization (EUA). This EUA will remain  in effect (meaning this test can be used) for the duration of the COVID-19 declaration under Se ction 564(b)(1) of the Act, 21 U.S.C. section 360bbb-3(b)(1), unless the authorization is terminated or revoked sooner.  Performed at [facility], [place] [address] [place], [place] [address]      Time coordinating discharge: 35 minutes  SIGNED:   Aline August, MD  [facility] 11/07/2020, 9:49 AM

## 2020-11-09 ENCOUNTER — Telehealth: Payer: Self-pay | Admitting: Nurse Practitioner

## 2020-11-09 DIAGNOSIS — N2581 Secondary hyperparathyroidism of renal origin: Secondary | ICD-10-CM | POA: Diagnosis not present

## 2020-11-09 DIAGNOSIS — N186 End stage renal disease: Secondary | ICD-10-CM | POA: Diagnosis not present

## 2020-11-09 DIAGNOSIS — Z992 Dependence on renal dialysis: Secondary | ICD-10-CM | POA: Diagnosis not present

## 2020-11-09 DIAGNOSIS — D631 Anemia in chronic kidney disease: Secondary | ICD-10-CM | POA: Diagnosis not present

## 2020-11-09 DIAGNOSIS — E1122 Type 2 diabetes mellitus with diabetic chronic kidney disease: Secondary | ICD-10-CM | POA: Diagnosis not present

## 2020-11-09 DIAGNOSIS — D509 Iron deficiency anemia, unspecified: Secondary | ICD-10-CM | POA: Diagnosis not present

## 2020-11-09 DIAGNOSIS — E162 Hypoglycemia, unspecified: Secondary | ICD-10-CM | POA: Diagnosis not present

## 2020-11-09 DIAGNOSIS — L299 Pruritus, unspecified: Secondary | ICD-10-CM | POA: Diagnosis not present

## 2020-11-09 NOTE — Telephone Encounter (Signed)
Transition of care contact from inpatient facility  Date of Discharge: 11/07/20 Date of Contact: 11/09/20 Method of contact: Phone  Attempted to contact patient to discuss transition of care from inpatient admission. Patient did not answer the phone. Message was left on the patient's voicemail with call back number [phone].

## 2020-11-10 ENCOUNTER — Ambulatory Visit: Payer: Medicare Other | Admitting: Podiatry

## 2020-11-10 ENCOUNTER — Telehealth: Payer: Self-pay | Admitting: Nephrology

## 2020-11-10 ENCOUNTER — Telehealth: Payer: Self-pay

## 2020-11-10 ENCOUNTER — Other Ambulatory Visit: Payer: Self-pay | Admitting: *Deleted

## 2020-11-10 ENCOUNTER — Encounter: Payer: Self-pay | Admitting: *Deleted

## 2020-11-10 ENCOUNTER — Telehealth: Payer: Self-pay | Admitting: *Deleted

## 2020-11-10 ENCOUNTER — Telehealth: Payer: Self-pay | Admitting: Podiatry

## 2020-11-10 NOTE — Telephone Encounter (Signed)
Patient has requested refill on pain meds

## 2020-11-10 NOTE — Patient Outreach (Signed)
[facility]) Care Management  11/10/2020  Jacob [name] 09-15-1951 [id number]   Notified that member discharged from hospital on 1/15 after being readmitted for a fall.  Per notes, he had subdural hematoma and left hip fracture, both required no intervention.  SNF was recommended but he refused, will have home health for PT and OT.    Call placed to member, state he is still having pain in his foot and hip.  He was given Percocet for pain control when discharged, state he only has 4 left and requesting refill.  This care manager inquired about home health, state he is supposed to have visits but it will be hard as he will have to go up/down the stairs whenever they come to visit.  Advised to the importance of therapy, will await call from agency.  State he does travel up/down the stairs on dialysis days, MWF.  Noted that he was scheduled for follow up with podiatrist today, state he will need to reschedule due to transportation issues.  Also noted that he should follow up with orthopedic physician within the next 3 weeks.  Calls were placed to podiatrist, visit rescheduled for 2/1 as well as appointment for ortho for 2/1.  Member advised of these appointments, also made aware that he has MRI scheduled for same day.  He expresses frustration regarding MRI, state he does not feel he need it, will call ortho office to request cancellation.  Advised to call transportation services to secure ride to these appointments.  Denies any urgent concerns, encouraged to contact this care manager with questions.  Agrees to follow up within the next week.  Goals Addressed            This Visit's Progress   . [facility]) Follow My Treatment Plan-Chronic Kidney   On track    Timeframe:  Long-Range Goal Priority:  High Start Date:   10/26/20                          Expected End Date:  02/20/21                     Follow Up Date 11/11/20   - ask for help if I can't afford my medicines - avoid  negative self-talk - call for medicine refill 2 or 3 days before it runs out - call the doctor or nurse before I stop taking medicine - call the doctor or nurse to get help with side effects - keep follow-up appointments - keep taking my medicines, even when I feel good  -attend scheduled hemodialysis treatments   Why is this important?    Staying as healthy as you can is very important. This may mean making changes if you smoke, don't exercise or eat poorly.   A healthy lifestyle is an important goal for you.   Following the treatment plan and making changes may be hard.   Try some of these steps to help keep the disease from getting worse.     Notes:  11/03/20-patient reporting his main goal is to make sure to attend hemodialysis and not to miss any treatments    . [facility]) Learn More About My Health   On track    Timeframe:  Short-Term Goal Priority:  Medium Start Date:   10/26/20  Expected End Date:  11/23/20                      Follow Up Date 11/11/20   - tell my story and reason for my visit - make a list of questions - ask questions - repeat what I heard to make sure I understand - bring a list of my medicines to the visit - speak up when I don't understand    Why is this important?    The best way to learn about your health and care is by talking to the doctor and nurse.   They will answer your questions and give you information in the way that you like best.    Notes:     . [facility]) Make and Keep All Appointments   On track    Timeframe:  Long-Range Goal Priority:  High Start Date:  10/26/20                           Expected End Date:  02/20/21                     Follow Up Date 11/11/20    - arrange a ride through an agency 1 week before appointment - ask family or friend for a ride - call to cancel if needed - keep a calendar with appointment dates  -schedule post hospital follow up appointment with PCP   Why is this important?     Part of staying healthy is seeing the doctor for follow-up care.   If you forget your appointments, there are some things you can do to stay on track.    Notes:     . ([facility]) Prevent Falls and Injury   On track    Timeframe:  Short-Term Goal Priority:  High Start Date:  11/03/20                           Expected End Date:  12/21/20                     Follow Up Date 11/11/20   - always use handrails on the stairs - keep my cell phone with me always - pick up clutter from the floors - use a cane or walker - use a nightlight in the bathroom  -discuss home health therapy with providers -change positions slowly   Why is this important?    Most falls happen when it is hard for you to walk safely. Your balance may be off because of an illness. You may have pain in your knees, hip or other joints.   You may be overly tired or taking medicines that make you sleepy. You may not be able to see or hear clearly.   Falls can lead to broken bones, bruises or other injuries.   There are things you can do to help prevent falling.     Notes:       Valente David, RN, MSN [place] [phone]

## 2020-11-10 NOTE — Telephone Encounter (Signed)
Transition of care contact from inpatient facility  Date of discharge: 11/07/20 Date of contact:  11/10/20 Method: Phone Spoke to: Patient  Patient contacted to discuss transition of care from recent inpatient hospitalization. Patient was admitted to [facility] from 1/12-1/15/22 with discharge diagnosis of small subdural hematoma and L femur fracture following fall.  Discharged home with [facility]   Medication changes were reviewed.  Patient will follow up with his/her outpatient HD unit on: He was able to go to dialysis Monday. No concerns today.

## 2020-11-10 NOTE — Telephone Encounter (Signed)
Let patient know that he did not have a Lspine MRI, to keep that appointment

## 2020-11-10 NOTE — Telephone Encounter (Signed)
error 

## 2020-11-10 NOTE — Telephone Encounter (Signed)
Transition Care Management Follow-Up Telephone Call   Date discharged and where:11/07/2020 [place]  How have you been since you were released from the hospital? Better. Had Brain bleed from fall  Any patient concerns? No  Items Reviewed:   Meds: Yes. Took off of Tramadol and placed on Oxycodone  Allergies:Yes  Dietary Changes Reviewed:Yes  Functional Questionnaire:  Independent-I Dependent-D  ADLs:I   Dressing- I    Eating-I   Maintaining continence-I   Transferring-I   Transportation-D   Meal Prep-I   Managing Meds- I  Confirmed importance and Date/Time of follow-up visits scheduled:Patient refused an appointment, stated that he has 4 appointments scheduled before 2/1 and he has to figure out how to get to those. Patient stated that he would call back to schedule an appointment later.    Confirmed with patient if condition worsens to call PCP or go to the Emergency Dept. Patient was given office number and encouraged to call back with questions or concerns: Yes

## 2020-11-10 NOTE — Telephone Encounter (Signed)
His foot fracture should be healed and relatively asymptomatic now, if he needs pain medication for his hip fracture he'll have to discuss with his orthopedist, I will re-evaluate his fracture at his next appointment if pain medication is appropriate

## 2020-11-10 NOTE — Telephone Encounter (Signed)
Patient called he stated he just had a MRI done at the hospital he wants to know if he will need to keep his appointment at  [place]. [phone]

## 2020-11-11 ENCOUNTER — Ambulatory Visit: Payer: Self-pay | Admitting: *Deleted

## 2020-11-11 ENCOUNTER — Telehealth: Payer: Self-pay

## 2020-11-11 DIAGNOSIS — Z992 Dependence on renal dialysis: Secondary | ICD-10-CM | POA: Diagnosis not present

## 2020-11-11 DIAGNOSIS — D631 Anemia in chronic kidney disease: Secondary | ICD-10-CM | POA: Diagnosis not present

## 2020-11-11 DIAGNOSIS — E1122 Type 2 diabetes mellitus with diabetic chronic kidney disease: Secondary | ICD-10-CM | POA: Diagnosis not present

## 2020-11-11 DIAGNOSIS — T85690S Other mechanical complication of epidural and subdural infusion catheter, sequela: Secondary | ICD-10-CM | POA: Insufficient documentation

## 2020-11-11 DIAGNOSIS — N186 End stage renal disease: Secondary | ICD-10-CM | POA: Diagnosis not present

## 2020-11-11 DIAGNOSIS — L299 Pruritus, unspecified: Secondary | ICD-10-CM | POA: Diagnosis not present

## 2020-11-11 DIAGNOSIS — D509 Iron deficiency anemia, unspecified: Secondary | ICD-10-CM | POA: Diagnosis not present

## 2020-11-11 DIAGNOSIS — N2581 Secondary hyperparathyroidism of renal origin: Secondary | ICD-10-CM | POA: Diagnosis not present

## 2020-11-11 DIAGNOSIS — E162 Hypoglycemia, unspecified: Secondary | ICD-10-CM | POA: Diagnosis not present

## 2020-11-11 NOTE — Telephone Encounter (Signed)
Pt called asking if he can get a refill on oxycodone he is currently down the 3 pills.

## 2020-11-11 NOTE — Telephone Encounter (Signed)
Please advise 

## 2020-11-12 ENCOUNTER — Other Ambulatory Visit: Payer: Self-pay | Admitting: Orthopaedic Surgery

## 2020-11-12 MED ORDER — OXYCODONE-ACETAMINOPHEN 5-325 MG PO TABS: 1.0000 | ORAL_TABLET | Freq: Three times a day (TID) | ORAL | 0 refills | Status: DC | PRN

## 2020-11-12 NOTE — Telephone Encounter (Signed)
Ucall. I sent in refill thanks

## 2020-11-13 ENCOUNTER — Other Ambulatory Visit: Payer: Self-pay | Admitting: *Deleted

## 2020-11-13 DIAGNOSIS — L299 Pruritus, unspecified: Secondary | ICD-10-CM | POA: Diagnosis not present

## 2020-11-13 DIAGNOSIS — E1122 Type 2 diabetes mellitus with diabetic chronic kidney disease: Secondary | ICD-10-CM | POA: Diagnosis not present

## 2020-11-13 DIAGNOSIS — D631 Anemia in chronic kidney disease: Secondary | ICD-10-CM | POA: Diagnosis not present

## 2020-11-13 DIAGNOSIS — E162 Hypoglycemia, unspecified: Secondary | ICD-10-CM | POA: Diagnosis not present

## 2020-11-13 DIAGNOSIS — N2581 Secondary hyperparathyroidism of renal origin: Secondary | ICD-10-CM | POA: Diagnosis not present

## 2020-11-13 DIAGNOSIS — D509 Iron deficiency anemia, unspecified: Secondary | ICD-10-CM | POA: Diagnosis not present

## 2020-11-13 DIAGNOSIS — Z992 Dependence on renal dialysis: Secondary | ICD-10-CM | POA: Diagnosis not present

## 2020-11-13 DIAGNOSIS — N186 End stage renal disease: Secondary | ICD-10-CM | POA: Diagnosis not present

## 2020-11-13 NOTE — Patient Outreach (Signed)
[facility]) Care Management  11/13/2020  LAFE CLERK 1951/01/02 [id number]   Outgoing call placed to member to follow up on request for pain medication refill and voice message received.  Member immediately state he no longer want to work with [facility], does not feel program has been of any assistance. Attempted to review benefits unfortunately member abruptly ended call.  Will close case at this time, will notify PCP of case closure.  Valente David, [place], MSN [place] [phone]

## 2020-11-16 ENCOUNTER — Other Ambulatory Visit: Payer: Self-pay | Admitting: Nurse Practitioner

## 2020-11-16 DIAGNOSIS — D631 Anemia in chronic kidney disease: Secondary | ICD-10-CM | POA: Diagnosis not present

## 2020-11-16 DIAGNOSIS — E162 Hypoglycemia, unspecified: Secondary | ICD-10-CM | POA: Diagnosis not present

## 2020-11-16 DIAGNOSIS — E1122 Type 2 diabetes mellitus with diabetic chronic kidney disease: Secondary | ICD-10-CM | POA: Diagnosis not present

## 2020-11-16 DIAGNOSIS — Z992 Dependence on renal dialysis: Secondary | ICD-10-CM | POA: Diagnosis not present

## 2020-11-16 DIAGNOSIS — D509 Iron deficiency anemia, unspecified: Secondary | ICD-10-CM | POA: Diagnosis not present

## 2020-11-16 DIAGNOSIS — L299 Pruritus, unspecified: Secondary | ICD-10-CM | POA: Diagnosis not present

## 2020-11-16 DIAGNOSIS — N186 End stage renal disease: Secondary | ICD-10-CM | POA: Diagnosis not present

## 2020-11-16 DIAGNOSIS — N2581 Secondary hyperparathyroidism of renal origin: Secondary | ICD-10-CM | POA: Diagnosis not present

## 2020-11-16 NOTE — Telephone Encounter (Signed)
Patient has request refill on medication "Diclofenac 1% Gel". Patient last refill was 08/17/2020 with 600grams. Patient was directed to use 2g to affected area 3 times as needed for pain. Patient should have 60 grams left which should last 10 days. Medication pend and sent to PCP Dewaine Oats [name], NP for approval.

## 2020-11-18 DIAGNOSIS — L299 Pruritus, unspecified: Secondary | ICD-10-CM | POA: Diagnosis not present

## 2020-11-18 DIAGNOSIS — D509 Iron deficiency anemia, unspecified: Secondary | ICD-10-CM | POA: Diagnosis not present

## 2020-11-18 DIAGNOSIS — N186 End stage renal disease: Secondary | ICD-10-CM | POA: Diagnosis not present

## 2020-11-18 DIAGNOSIS — D631 Anemia in chronic kidney disease: Secondary | ICD-10-CM | POA: Diagnosis not present

## 2020-11-18 DIAGNOSIS — N2581 Secondary hyperparathyroidism of renal origin: Secondary | ICD-10-CM | POA: Diagnosis not present

## 2020-11-18 DIAGNOSIS — Z992 Dependence on renal dialysis: Secondary | ICD-10-CM | POA: Diagnosis not present

## 2020-11-18 DIAGNOSIS — E162 Hypoglycemia, unspecified: Secondary | ICD-10-CM | POA: Diagnosis not present

## 2020-11-18 DIAGNOSIS — E1122 Type 2 diabetes mellitus with diabetic chronic kidney disease: Secondary | ICD-10-CM | POA: Diagnosis not present

## 2020-11-20 ENCOUNTER — Telehealth: Payer: Self-pay | Admitting: Orthopaedic Surgery

## 2020-11-20 ENCOUNTER — Other Ambulatory Visit: Payer: Self-pay | Admitting: Orthopaedic Surgery

## 2020-11-20 DIAGNOSIS — L299 Pruritus, unspecified: Secondary | ICD-10-CM | POA: Diagnosis not present

## 2020-11-20 DIAGNOSIS — D509 Iron deficiency anemia, unspecified: Secondary | ICD-10-CM | POA: Diagnosis not present

## 2020-11-20 DIAGNOSIS — N2581 Secondary hyperparathyroidism of renal origin: Secondary | ICD-10-CM | POA: Diagnosis not present

## 2020-11-20 DIAGNOSIS — D631 Anemia in chronic kidney disease: Secondary | ICD-10-CM | POA: Diagnosis not present

## 2020-11-20 DIAGNOSIS — N186 End stage renal disease: Secondary | ICD-10-CM | POA: Diagnosis not present

## 2020-11-20 DIAGNOSIS — E162 Hypoglycemia, unspecified: Secondary | ICD-10-CM | POA: Diagnosis not present

## 2020-11-20 DIAGNOSIS — E1122 Type 2 diabetes mellitus with diabetic chronic kidney disease: Secondary | ICD-10-CM | POA: Diagnosis not present

## 2020-11-20 DIAGNOSIS — Z992 Dependence on renal dialysis: Secondary | ICD-10-CM | POA: Diagnosis not present

## 2020-11-20 MED ORDER — TRAMADOL HCL 50 MG PO TABS: 50.0000 mg | ORAL_TABLET | Freq: Two times a day (BID) | ORAL | 0 refills | Status: DC | PRN

## 2020-11-20 NOTE — Telephone Encounter (Signed)
Ucall. OK to send in ultram  # 20   one po bid prn pain thanks.     Time to stop percocet.

## 2020-11-20 NOTE — Telephone Encounter (Signed)
Please advise 

## 2020-11-20 NOTE — Addendum Note (Signed)
Addended by: Meyer Cory on: 11/20/2020 05:20 PM   Modules accepted: Orders

## 2020-11-20 NOTE — Telephone Encounter (Signed)
Pt would like to know if he can be perscribed more oxycodone until Friday!

## 2020-11-20 NOTE — Telephone Encounter (Signed)
Called to pharmacy. I called patient and advised. 

## 2020-11-23 DIAGNOSIS — Z992 Dependence on renal dialysis: Secondary | ICD-10-CM | POA: Diagnosis not present

## 2020-11-23 DIAGNOSIS — I129 Hypertensive chronic kidney disease with stage 1 through stage 4 chronic kidney disease, or unspecified chronic kidney disease: Secondary | ICD-10-CM | POA: Diagnosis not present

## 2020-11-23 DIAGNOSIS — L299 Pruritus, unspecified: Secondary | ICD-10-CM | POA: Diagnosis not present

## 2020-11-23 DIAGNOSIS — N2581 Secondary hyperparathyroidism of renal origin: Secondary | ICD-10-CM | POA: Diagnosis not present

## 2020-11-23 DIAGNOSIS — E1122 Type 2 diabetes mellitus with diabetic chronic kidney disease: Secondary | ICD-10-CM | POA: Diagnosis not present

## 2020-11-23 DIAGNOSIS — D509 Iron deficiency anemia, unspecified: Secondary | ICD-10-CM | POA: Diagnosis not present

## 2020-11-23 DIAGNOSIS — N186 End stage renal disease: Secondary | ICD-10-CM | POA: Diagnosis not present

## 2020-11-23 DIAGNOSIS — E162 Hypoglycemia, unspecified: Secondary | ICD-10-CM | POA: Diagnosis not present

## 2020-11-23 DIAGNOSIS — D631 Anemia in chronic kidney disease: Secondary | ICD-10-CM | POA: Diagnosis not present

## 2020-11-23 NOTE — Progress Notes (Incomplete)
Date:  11/23/2020   ID:  Jacob [name], DOB [birth year], MRN [id number]  PCP:  [name], NP  Cardiologist:  Dr. [name]    History of Present Illness:  70 y.o. with distant CABG in 2004 with EF improving from 24% to 45-50% after revascularization. Now on dialysis  Followed by Dr [name] Had revision of left arm fistula January 2019  Comorbid conditions include HIV previous drug abuse HTN and HLD and DM  Echo 02/07/19 EF 40-45% Severe LAE no significant valve disease  Myovue 12/13/18 abnormal EF 26% large inferior wall infarct  Right and left cath done by Dr Irish Lack 12/19/19  Had patent SVG to RCA distal LAD stenosis too small to intervene SVG RAMUS 95% with stenting  Circumflex occluded with left to left collaterals  RA 16 mmHg PA 70/28 mmHg PCWP 28 mmHg   Has chronic anemia with multiple transfusions   EGD/Colon with gastric AVM;s   Admitted 11/04/20 with headache/hip pain after fall Noted to have small subdural hematoma and left non displaced trochanteric fracture After repeat CT stable Was d/c on Brilinta only no ASA    ***  Feels better post cath Not clear why Dr Dederding stopped his coreg needs to be back on beta blocker  Having hard time with dialysis Been on it a year and feels horrible Dr D has increased his dry weight Neuropathic pain in RLE  Past Medical History:  Diagnosis Date  . Acute respiratory failure ([place]) 03/01/2018  . Arthritis    "all over; mostly knees and back" (02/28/2018)  . Chronic lower back pain    stenosis  . Community acquired pneumonia 09/06/2013  . COPD (chronic obstructive pulmonary disease) ([place])   . Coronary atherosclerosis of native coronary artery    a. 02/2003 s/p CABG x 2 (VG->RI, VG->RPDA; b. 11/2019 PCI: LM nl, LAD 90d, D3 50, RI 100, LCX 100p, OM3 100 - fills via L->L collats from D2/dLAD, RCA 100p, VG->RPDA ok, VG->RI 95 (3.5x48 Synergy XD DES).  . Drug abuse ([place])    hx; tested for cocaine as recently as 2/08. says he is  not using drugs now - avoided defib. for this reason   . ESRD (end stage renal disease) ([place])    Hemo M-W-F- Richarda Blade  . Fall at home 10/2020  . GERD (gastroesophageal reflux disease)    takes OTC meds as needed  . GI bleeding    a. 11/2019 EGD: angiodysplastic lesions w/ bleeding s/p argon plasma/clipping/epi inj.  . Glaucoma    uses eye drops daily  . Hepatitis B 1968   "tx'd w/isolation; caught it from toilet stools in gym"  . HFrEF (heart failure with reduced ejection fraction) ([place])    a. 01/2019 Echo: EF 40-45%, diffuse HK, mild basal septal hypertrophy.  . History of blood transfusion 03/01/2019  . History of colon polyps    benign  . History of gout    takes Allopurinol daily as well as Colchicine-if needed (02/28/2018)  . History of kidney stones   . HTN (hypertension)    takes Coreg,Imdur.and Apresoline daily  . Human immunodeficiency virus (HIV) disease ([place]) dx'd 1995   takes Genvoya daily  . Hyperlipidemia    takes Atorvastatin daily  . Ischemic cardiomyopathy    a. 01/2019 Echo: EF 40-45%, diffuse HK, mild basal septal hypertrophy. Diast dysfxn. Nl RV size/fxn. Sev dil LA. Triv MR/TR/PR.  [name] Muscle spasm    takes Zanaflex as needed  .  Myocardial infarction ([place]) ~ 2004/2005  . Nocturia   . Peripheral neuropathy    takes gabapentin daily  . Pneumonia    "at least twice" (02/28/2018)  . Shortness of breath dyspnea    rarely but if notices it then with exertion  . Syphilis, unspecified   . Type II diabetes mellitus ([place]) 2004   Lantus daily.Average fasting blood sugar 125-199  . Wears glasses   . Wears partial dentures     Past Surgical History:  Procedure Laterality Date  . AV FISTULA PLACEMENT Left 08/02/2018   Procedure: ARTERIOVENOUS (AV) FISTULA CREATION  left arm radiocephlic;  Surgeon: Marty Heck, MD;  Location: [facility];  Service: Vascular;  Laterality: Left;  . AV FISTULA PLACEMENT Left 08/01/2019   Procedure: LEFT BRACHIOCEPHALIC ARTERIOVENOUS (AV)  FISTULA CREATION;  Surgeon: [name], MD;  Location: [facility];  Service: Vascular;  Laterality: Left;  . BASCILIC VEIN TRANSPOSITION Left 10/03/2019   Procedure: BASILIC VEIN TRANSPOSITION LEFT SECOND STAGE;  Surgeon: [name], MD;  Location: [facility];  Service: Vascular;  Laterality: Left;  . CARDIAC CATHETERIZATION  10/2002; 12/19/2004   [name] 03/08/2011  . COLONOSCOPY  2013   Dr.John Henrene Pastor   . CORONARY ARTERY BYPASS GRAFT  02/24/2003   CABG X2/notes 03/08/2011  . CORONARY STENT INTERVENTION N/A 12/19/2019   Procedure: CORONARY STENT INTERVENTION;  Surgeon: Jettie Booze, MD;  Location: [facility];  Service: Cardiovascular;  Laterality: N/A;  . ESOPHAGOGASTRODUODENOSCOPY (EGD) WITH PROPOFOL N/A 02/08/2019   Procedure: ESOPHAGOGASTRODUODENOSCOPY (EGD) WITH PROPOFOL;  Surgeon: Milus Banister, MD;  Location: [facility];  Service: Gastroenterology;  Laterality: N/A;  . ESOPHAGOGASTRODUODENOSCOPY (EGD) WITH PROPOFOL N/A 12/22/2019   Procedure: ESOPHAGOGASTRODUODENOSCOPY (EGD) WITH PROPOFOL;  Surgeon: Lavena Bullion, DO;  Location: [facility];  Service: Gastroenterology;  Laterality: N/A;  . ESOPHAGOGASTRODUODENOSCOPY (EGD) WITH PROPOFOL N/A 10/19/2020   Procedure: ESOPHAGOGASTRODUODENOSCOPY (EGD) WITH PROPOFOL;  Surgeon: Jackquline Denmark, MD;  Location[facility] END[facility]COPY;  Service: Endoscopy;  Laterality: N/A;  . HEMOSTASIS CLIP PLACEMENT  12/22/2019   Procedure: HEMOSTASIS CLIP PLACEMENT;  Surgeon: Lavena Bullion, DO;  Location: [facility];  Service: Gastroenterology;;  . HOT HEMOSTASIS N/A 02/08/2019   Procedure: HOT HEMOSTASIS (ARGON PLASMA COAGULATION/BICAP);  Surgeon: Milus Banister, MD;  Location: [facility] ENDOSCOPY;  Service: Gastroenterology;  Laterality: N/A;  . HOT HEMOSTASIS N/A 12/22/2019   Procedure: HOT HEMOSTASIS (ARGON PLASMA COAGULATION/BICAP);  Surgeon: Lavena Bullion, DO;  Location: [facility] ENDOSCOPY;  Service: Gastroenterology;  Laterality: N/A;  . HOT HEMOSTASIS  N/A 10/19/2020   Procedure: HOT HEMOSTASIS (ARGON PLASMA COAGULATION/BICAP);  Surgeon: Jackquline Denmark, MD;  Location: [facility] ENDOSCOPY;  Service: Endoscopy;  Laterality: N/A;  . INTERTROCHANTERIC HIP FRACTURE SURGERY Left 11/2006   [name] 03/08/2011  . INTRAVASCULAR ULTRASOUND/IVUS N/A 12/19/2019   Procedure: Intravascular Ultrasound/IVUS;  Surgeon: Jettie Booze, MD;  Location: [place] [facility];  Service: Cardiovascular;  Laterality: N/A;  . IR FLUORO GUIDE CV LINE RIGHT  07/24/2019  . IR FLUORO GUIDE CV LINE RIGHT  07/30/2019  . IR US GUIDE VASC ACCESS RIGHT  07/24/2019  . IR US GUIDE VASC ACCESS RIGHT  07/30/2019  . LAPAROSCOPIC CHOLECYSTECTOMY  05/2006  . LIGATION OF COMPETING BRANCHES OF ARTERIOVENOUS FISTULA Left 11/05/2018   Procedure: LIGATION OF COMPETING BRANCHES OF ARTERIOVENOUS FISTULA  LEFT  ARM;  Surgeon: Marty Heck, MD;  Location: [place];  Service: Vascular;  Laterality: Left;  . LUMBAR LAMINECTOMY/DECOMPRESSION MICRODISCECTOMY N/A 02/29/2016   Procedure: Left L4-5  Lateral Recess Decompression, Removal Extradural Intraspinal Facet Cyst;  Surgeon: Marybelle Killings, MD;  Location: [place];  Service: Orthopedics;  Laterality: N/A;  . MULTIPLE TOOTH EXTRACTIONS    . ORIF MANDIBULAR FRACTURE Left 08/13/2004   ORIF of left body fracture mandible with KLS Martin 2.3-mm six hole/notes 03/08/2011  . RIGHT/LEFT HEART CATH AND CORONARY/GRAFT ANGIOGRAPHY N/A 12/19/2019   Procedure: RIGHT/LEFT HEART CATH AND CORONARY/GRAFT ANGIOGRAPHY;  Surgeon: Jettie Booze, MD;  Location: [place] CV [facility];  Service: Cardiovascular;  Laterality: N/A;  . SCLEROTHERAPY  12/22/2019   Procedure: SCLEROTHERAPY;  Surgeon: Lavena Bullion, DO;  Location: [facility] ENDOSCOPY;  Service: Gastroenterology;;     Current Outpatient Medications  Medication Sig Dispense Refill  . acetaminophen (TYLENOL) 650 MG CR tablet Take 650 mg by mouth 3 (three) times daily.    [name] allopurinol (ZYLOPRIM) 100 MG tablet TAKE 1  TABLET(100 MG) BY MOUTH DAILY (Patient taking differently: Take 100 mg by mouth daily.) 90 tablet 1  . ANORO ELLIPTA 62.5-25 MCG/INH AEPB INHALE 1 PUFF BY MOUTH EVERY DAY (Patient taking differently: Inhale 1 puff into the lungs daily.) 60 each 5  . B-D UF III MINI PEN NEEDLES 31G X 5 MM MISC USE FOUR TIMES DAILY 100 each 11  . bictegravir-emtricitabine-tenofovir AF (BIKTARVY) 50-200-25 MG TABS tablet Take 1 tablet by mouth daily. 30 tablet 11  . carvedilol (COREG) 3.125 MG tablet Take 1 tablet (3.125 mg total) by mouth 2 (two) times daily. 180 tablet 3  . Cinacalcet HCl (SENSIPAR PO) Take by mouth.     . colchicine 0.6 MG tablet Take 0.6-1.2 mg by mouth daily as needed (gout flare-up).    . diclofenac Sodium (VOLTAREN) 1 % GEL APPLY 2 GRAMS TO THE AFFECTED AREA THREE TIMES DAILY AS NEEDED FOR PAIN 696 g 0  . folic acid (FOLVITE) 1 MG tablet Take 1 tablet (1 mg total) by mouth daily. 30 tablet 0  . gabapentin (NEURONTIN) 300 MG capsule Take 1 capsule (300 mg total) by mouth 2 (two) times daily. 180 capsule 1  . insulin lispro (HUMALOG KWIKPEN) 100 UNIT/ML KwikPen Inject 0.98ml (20 Units total) into the skin 3 (three) times daily as needed for high blood sugar (above 150) (Patient taking differently: Inject 20 Units into the skin 3 (three) times daily as needed (blood sugar >150 mg/dl).) 15 pen 1  . iron sucrose in sodium chloride 0.9 % 100 mL Iron Sucrose (Venofer)    . isosorbide mononitrate (IMDUR) 30 MG 24 hr tablet TAKE 1 TABLET BY MOUTH EVERY DAY (Patient taking differently: Take 30 mg by mouth daily. TAKE 1 TABLET BY MOUTH EVERY DAY) 90 tablet 1  . Lancets (ONETOUCH DELICA PLUS VELFYB01B) MISC Inject 1 Device as directed 3 (three) times daily. Dx: E11.40 300 each 3  . latanoprost (XALATAN) 0.005 % ophthalmic solution Place 1 drop into both eyes at bedtime.    . Methoxy PEG-Epoetin Beta (MIRCERA IJ) Mircera    . multivitamin (RENA-VIT) TABS tablet Take 1 tablet by mouth daily.    . nitroGLYCERIN  (NITROSTAT) 0.3 MG SL tablet ONE TABLET UNDER TONGUE AS NEEDED FOR CHEST PAIN (Patient taking differently: Place 0.3 mg under the tongue every 5 (five) minutes as needed for chest pain (max 3 doses).) 25 tablet 4  . Nutritional Supplements (VITAMIN D BOOSTER PO) Take 1 each by mouth 2 (two) times daily.    Glory Rosebush ULTRA test strip CHECK BLOOD SUGAR 3 TIMES  DAILY 300 strip 3  .  oxyCODONE-acetaminophen (PERCOCET/ROXICET) 5-325 MG tablet Take 1 tablet by mouth every 8 (eight) hours as needed for severe pain. 14 tablet 0  . pantoprazole (PROTONIX) 40 MG tablet TAKE 1 TABLET(40 MG) BY MOUTH TWICE DAILY (Patient taking differently: Take 40 mg by mouth 2 (two) times daily.) 180 tablet 0  . polyethylene glycol (MIRALAX / GLYCOLAX) 17 g packet Take 17 g by mouth daily as needed for mild constipation. 14 each 0  . sevelamer carbonate (RENVELA) 800 MG tablet Take 800 mg by mouth 3 (three) times daily with meals.    . thiamine 100 MG tablet Take 1 tablet (100 mg total) by mouth daily. 30 tablet 0  . ticagrelor (BRILINTA) 90 MG TABS tablet Take 90 mg by mouth 2 (two) times daily.    . timolol (TIMOPTIC) 0.5 % ophthalmic solution Place 1 drop into both eyes daily.    . traMADol (ULTRAM) 50 MG tablet Take 1 tablet (50 mg total) by mouth 2 (two) times daily as needed. 20 tablet 0   No current facility-administered medications for this visit.    Allergies:   Augmentin [amoxicillin-pot clavulanate] and Amphetamines    Social History:  The patient  reports that he has been smoking cigarettes. He has a 21.50 pack-year smoking history. He has never used smokeless tobacco. He reports current alcohol use of about 12.0 standard drinks of alcohol per week.  Drug: Cocaine.   Family History:  The patient's family history includes Alzheimer's disease in his mother and sister; Diabetes in his brother, brother, and sister; Drug abuse in his brother; Heart attack in his brother; Heart failure in his father; Hypertension in  his brother and father; Stroke in his sister.    ROS:  General:no colds or fevers,  weight down 3 lbs by our scales but at home back to baseline Skin:no rashes or ulcers HEENT:no blurred vision, no congestion CV:see HPI PUL:see HPI GI:no diarrhea constipation or melena, no indigestion GU:no hematuria, no dysuria MS:no joint pain, no claudication Neuro:no syncope, no lightheadedness Endo:+ diabetes glucose stable, no thyroid disease  Wt Readings from Last 3 Encounters:  11/06/20 75.3 kg  10/20/20 78 kg  10/19/20 78.3 kg     PHYSICAL EXAM: VS:  There were no vitals taken for this visit. , BMI There is no height or weight on file to calculate BMI.   Affect appropriate Chronically ill male  HEENT: normal Neck supple with no adenopathy JVP normal no bruits no thyromegaly Lungs clear with no wheezing and good diaphragmatic motion Heart:  S1/S2 no murmur, no rub, gallop or click PMI enlarged post sternotimy  Abdomen: benighn, BS positve, no tenderness, no AAA no bruit.  No HSM or HJR Left arm fistula with thrill  No edema Neuro non-focal Skin warm and dry No muscular weakness    EKG:   12/20/19 SR Rate 28 RBBB old IMI     Recent Labs: 10/17/2020: TSH 0.748 11/06/2020: ALT 13; BUN 50; Creatinine, Ser 6.41; Hemoglobin 9.8; Magnesium 2.3; Platelets 172; Potassium 3.7; Sodium 137    Lipid Panel    Component Value Date/Time   CHOL 65 12/21/2019 0352   CHOL 121 08/07/2015 1041   TRIG 95 12/21/2019 0352   HDL 17 (L) 12/21/2019 0352   HDL 32 (L) 08/07/2015 1041   CHOLHDL 3.8 12/21/2019 0352   VLDL 19 12/21/2019 0352   LDLCALC 29 12/21/2019 0352   LDLCALC  06/04/2019 1019     Comment:     . LDL cholesterol not calculated.  Triglyceride levels greater than 400 mg/dL invalidate calculated LDL results. . Reference range: <100 . Desirable range <100 mg/dL for primary prevention;   <70 mg/dL for patients with CHD or diabetic patients  with > or = 2 CHD risk  factors. [name] LDL-C is now calculated using the Martin-Hopkins  calculation, which is a validated novel method providing  better accuracy than the Friedewald equation in the  estimation of LDL-C.  Cresenciano Genre et al. Annamaria Helling. 1610;960(45): 2061-2068  (http://education.[employer].com/faq/FAQ164)        Other studies Reviewed: Additional studies/ records that were reviewed today include: recent OV note.  ECHO  02/07/19 IMPRESSIONS    1. The left ventricle has mild-moderately reduced systolic function, with  an ejection fraction of 40-45%. The cavity size was normal. Mild basal  septal hypertrophy. Left ventricular diastolic Doppler parameters are  consistent with pseudonormalization.  Elevated left ventricular end-diastolic pressure Left ventricular diffuse  hypokinesis.  2. The right ventricle has normal systolic function. The cavity was  normal. There is no increase in right ventricular wall thickness.  3. Left atrial size was severely dilated.  4. There was trivial MR, TR and PR.    Cath 12/19/19  Conclusion    Prox RCA lesion is 100% stenosed. SVG to RCA is widely patent.  Dist LAD lesion is 90% stenosed. Too small and distal for PCI.  3rd Diag lesion is 50% stenosed.  Prox Cx lesion is 100% stenosed. Left to left collaterals.  Ramus lesion is 100% stenosed.  SVG to ramus with Origin to Prox Graft lesion is 95% stenosed.  A drug-eluting stent was successfully placed using a SYNERGY XD 3.50X48, postdilated to >4 mm. Optimized with IVUS.  Post intervention, there is a 0% residual stenosis.  There is moderate to severe left ventricular systolic dysfunction.  There is no aortic valve stenosis.  Hemodynamic findings consistent with moderate pulmonary hypertension.   Continue dual antiplatelet therapy for 6 months at least.  COnsider clopidogrel longer term if there is no interaction with his HIV meds, given the diffuse nature of CAD.      ASSESSMENT AND  PLAN:  1.  Acute on chronic systolic and diastolic HF this is related to CRF  EF 40-45% echo 10/18/20  Fluid controlled with dialysis Continue nitrates and coreg   2. CAD: Cath 12/15/19 stent SVG Ramus patent SVG to RCA. Left to left collaterals to circumflex and diffuse distal LAD disease Imdur 30 mg daily added to medical Rx  Not on ACE/ARB due to CRF  Continue coreg Recent SDH from fall change to plavix monotherapy CT 11/06/20 trace subdural along posterior falx.   3. DM:  Discussed low carb diet.  Target hemoglobin A1c is 6.5 or less.  Continue current medications.  4. CRF:  On dialysis since October 2021  he indicates weight is down despite worse dyspnea and edema F/U renal ? Add oral lasix since not anuric   5. Cholesterol:  On statin labs with primary Triglycerides  up due to poor BS control   6. HTN:  Well controlled.  Continue current medications and low sodium Dash type diet.    7. Anemia per renal/GI  transfused 12/24/19 Hct 26.9 EGD 12/22/19 with angiodyplastic lesions in gastric fundus Rx with Argon laser Hct 33/6 11/05/20  8. HIV:  F/u Dr [name] on [place]    F/U in 6 months   Jenkins Rouge, MD

## 2020-11-24 ENCOUNTER — Ambulatory Visit: Payer: Medicare Other | Admitting: Orthopaedic Surgery

## 2020-11-24 ENCOUNTER — Inpatient Hospital Stay (HOSPITAL_COMMUNITY)
Admission: EM | Admit: 2020-11-24 | Discharge: 2020-11-25 | DRG: 377 | Discharge status: Against Medical Advice | Payer: Medicare Other | Attending: Internal Medicine | Admitting: Internal Medicine

## 2020-11-24 ENCOUNTER — Inpatient Hospital Stay (HOSPITAL_COMMUNITY): Payer: Medicare Other

## 2020-11-24 ENCOUNTER — Encounter (HOSPITAL_COMMUNITY): Payer: Self-pay

## 2020-11-24 ENCOUNTER — Other Ambulatory Visit: Payer: Medicare Other

## 2020-11-24 ENCOUNTER — Other Ambulatory Visit: Payer: Self-pay

## 2020-11-24 ENCOUNTER — Ambulatory Visit: Payer: Medicare Other | Admitting: Podiatry

## 2020-11-24 DIAGNOSIS — E785 Hyperlipidemia, unspecified: Secondary | ICD-10-CM | POA: Diagnosis not present

## 2020-11-24 DIAGNOSIS — I255 Ischemic cardiomyopathy: Secondary | ICD-10-CM | POA: Diagnosis present

## 2020-11-24 DIAGNOSIS — R404 Transient alteration of awareness: Secondary | ICD-10-CM | POA: Diagnosis not present

## 2020-11-24 DIAGNOSIS — E8889 Other specified metabolic disorders: Secondary | ICD-10-CM | POA: Diagnosis not present

## 2020-11-24 DIAGNOSIS — Z992 Dependence on renal dialysis: Secondary | ICD-10-CM | POA: Diagnosis not present

## 2020-11-24 DIAGNOSIS — Z20822 Contact with and (suspected) exposure to covid-19: Secondary | ICD-10-CM | POA: Diagnosis present

## 2020-11-24 DIAGNOSIS — D62 Acute posthemorrhagic anemia: Secondary | ICD-10-CM | POA: Diagnosis not present

## 2020-11-24 DIAGNOSIS — Z794 Long term (current) use of insulin: Secondary | ICD-10-CM

## 2020-11-24 DIAGNOSIS — N2581 Secondary hyperparathyroidism of renal origin: Secondary | ICD-10-CM | POA: Diagnosis not present

## 2020-11-24 DIAGNOSIS — Z7951 Long term (current) use of inhaled steroids: Secondary | ICD-10-CM

## 2020-11-24 DIAGNOSIS — E1142 Type 2 diabetes mellitus with diabetic polyneuropathy: Secondary | ICD-10-CM

## 2020-11-24 DIAGNOSIS — K922 Gastrointestinal hemorrhage, unspecified: Secondary | ICD-10-CM | POA: Diagnosis present

## 2020-11-24 DIAGNOSIS — Z87442 Personal history of urinary calculi: Secondary | ICD-10-CM

## 2020-11-24 DIAGNOSIS — F1010 Alcohol abuse, uncomplicated: Secondary | ICD-10-CM | POA: Diagnosis present

## 2020-11-24 DIAGNOSIS — K921 Melena: Secondary | ICD-10-CM | POA: Diagnosis not present

## 2020-11-24 DIAGNOSIS — I499 Cardiac arrhythmia, unspecified: Secondary | ICD-10-CM | POA: Diagnosis not present

## 2020-11-24 DIAGNOSIS — F17200 Nicotine dependence, unspecified, uncomplicated: Secondary | ICD-10-CM

## 2020-11-24 DIAGNOSIS — Z955 Presence of coronary angioplasty implant and graft: Secondary | ICD-10-CM

## 2020-11-24 DIAGNOSIS — F17210 Nicotine dependence, cigarettes, uncomplicated: Secondary | ICD-10-CM | POA: Diagnosis present

## 2020-11-24 DIAGNOSIS — S72112D Displaced fracture of greater trochanter of left femur, subsequent encounter for closed fracture with routine healing: Secondary | ICD-10-CM

## 2020-11-24 DIAGNOSIS — I4510 Unspecified right bundle-branch block: Secondary | ICD-10-CM | POA: Diagnosis present

## 2020-11-24 DIAGNOSIS — Z8719 Personal history of other diseases of the digestive system: Secondary | ICD-10-CM

## 2020-11-24 DIAGNOSIS — Z888 Allergy status to other drugs, medicaments and biological substances status: Secondary | ICD-10-CM

## 2020-11-24 DIAGNOSIS — U071 COVID-19: Secondary | ICD-10-CM | POA: Diagnosis present

## 2020-11-24 DIAGNOSIS — D631 Anemia in chronic kidney disease: Secondary | ICD-10-CM | POA: Diagnosis present

## 2020-11-24 DIAGNOSIS — S065X9A Traumatic subdural hemorrhage with loss of consciousness of unspecified duration, initial encounter: Secondary | ICD-10-CM | POA: Diagnosis not present

## 2020-11-24 DIAGNOSIS — Y92009 Unspecified place in unspecified non-institutional (private) residence as the place of occurrence of the external cause: Secondary | ICD-10-CM

## 2020-11-24 DIAGNOSIS — Z8249 Family history of ischemic heart disease and other diseases of the circulatory system: Secondary | ICD-10-CM

## 2020-11-24 DIAGNOSIS — K31819 Angiodysplasia of stomach and duodenum without bleeding: Secondary | ICD-10-CM | POA: Diagnosis not present

## 2020-11-24 DIAGNOSIS — I5042 Chronic combined systolic (congestive) and diastolic (congestive) heart failure: Secondary | ICD-10-CM | POA: Diagnosis present

## 2020-11-24 DIAGNOSIS — Z5329 Procedure and treatment not carried out because of patient's decision for other reasons: Secondary | ICD-10-CM | POA: Diagnosis not present

## 2020-11-24 DIAGNOSIS — H409 Unspecified glaucoma: Secondary | ICD-10-CM | POA: Diagnosis present

## 2020-11-24 DIAGNOSIS — R531 Weakness: Secondary | ICD-10-CM | POA: Diagnosis not present

## 2020-11-24 DIAGNOSIS — W19XXXD Unspecified fall, subsequent encounter: Secondary | ICD-10-CM | POA: Diagnosis present

## 2020-11-24 DIAGNOSIS — B20 Human immunodeficiency virus [HIV] disease: Secondary | ICD-10-CM | POA: Diagnosis not present

## 2020-11-24 DIAGNOSIS — I5043 Acute on chronic combined systolic (congestive) and diastolic (congestive) heart failure: Secondary | ICD-10-CM | POA: Diagnosis present

## 2020-11-24 DIAGNOSIS — Z833 Family history of diabetes mellitus: Secondary | ICD-10-CM

## 2020-11-24 DIAGNOSIS — M109 Gout, unspecified: Secondary | ICD-10-CM | POA: Diagnosis not present

## 2020-11-24 DIAGNOSIS — I10 Essential (primary) hypertension: Secondary | ICD-10-CM | POA: Diagnosis not present

## 2020-11-24 DIAGNOSIS — I132 Hypertensive heart and chronic kidney disease with heart failure and with stage 5 chronic kidney disease, or end stage renal disease: Secondary | ICD-10-CM | POA: Diagnosis not present

## 2020-11-24 DIAGNOSIS — I2510 Atherosclerotic heart disease of native coronary artery without angina pectoris: Secondary | ICD-10-CM | POA: Diagnosis not present

## 2020-11-24 DIAGNOSIS — Z880 Allergy status to penicillin: Secondary | ICD-10-CM

## 2020-11-24 DIAGNOSIS — Z951 Presence of aortocoronary bypass graft: Secondary | ICD-10-CM | POA: Diagnosis not present

## 2020-11-24 DIAGNOSIS — Z820 Family history of epilepsy and other diseases of the nervous system: Secondary | ICD-10-CM

## 2020-11-24 DIAGNOSIS — I252 Old myocardial infarction: Secondary | ICD-10-CM

## 2020-11-24 DIAGNOSIS — N186 End stage renal disease: Secondary | ICD-10-CM

## 2020-11-24 DIAGNOSIS — Z823 Family history of stroke: Secondary | ICD-10-CM

## 2020-11-24 DIAGNOSIS — Z79899 Other long term (current) drug therapy: Secondary | ICD-10-CM

## 2020-11-24 DIAGNOSIS — K219 Gastro-esophageal reflux disease without esophagitis: Secondary | ICD-10-CM | POA: Diagnosis present

## 2020-11-24 DIAGNOSIS — R195 Other fecal abnormalities: Secondary | ICD-10-CM | POA: Diagnosis not present

## 2020-11-24 DIAGNOSIS — J449 Chronic obstructive pulmonary disease, unspecified: Secondary | ICD-10-CM | POA: Diagnosis present

## 2020-11-24 DIAGNOSIS — E1122 Type 2 diabetes mellitus with diabetic chronic kidney disease: Secondary | ICD-10-CM | POA: Diagnosis present

## 2020-11-24 DIAGNOSIS — D649 Anemia, unspecified: Secondary | ICD-10-CM | POA: Diagnosis not present

## 2020-11-24 DIAGNOSIS — S065X9D Traumatic subdural hemorrhage with loss of consciousness of unspecified duration, subsequent encounter: Secondary | ICD-10-CM

## 2020-11-24 DIAGNOSIS — I509 Heart failure, unspecified: Secondary | ICD-10-CM | POA: Diagnosis not present

## 2020-11-24 DIAGNOSIS — Z743 Need for continuous supervision: Secondary | ICD-10-CM | POA: Diagnosis not present

## 2020-11-24 DIAGNOSIS — Z9115 Patient's noncompliance with renal dialysis: Secondary | ICD-10-CM

## 2020-11-24 DIAGNOSIS — Z7902 Long term (current) use of antithrombotics/antiplatelets: Secondary | ICD-10-CM | POA: Diagnosis not present

## 2020-11-24 DIAGNOSIS — R6889 Other general symptoms and signs: Secondary | ICD-10-CM | POA: Diagnosis not present

## 2020-11-24 LAB — BASIC METABOLIC PANEL
Anion gap: 18 — ABNORMAL HIGH (ref 5–15)
BUN: 89 mg/dL — ABNORMAL HIGH (ref 8–23)
CO2: 24 mmol/L (ref 22–32)
Calcium: 7.6 mg/dL — ABNORMAL LOW (ref 8.9–10.3)
Chloride: 99 mmol/L (ref 98–111)
Creatinine, Ser: 6.13 mg/dL — ABNORMAL HIGH (ref 0.61–1.24)
GFR, Estimated: 9 mL/min — ABNORMAL LOW (ref >60)
Glucose, Bld: 146 mg/dL — ABNORMAL HIGH (ref 70–99)
Potassium: 3.7 mmol/L (ref 3.5–5.1)
Sodium: 141 mmol/L (ref 135–145)

## 2020-11-24 LAB — CBC WITH DIFFERENTIAL/PLATELET
Abs Immature Granulocytes: 0 10*3/uL (ref 0.00–0.07)
Basophils Absolute: 0 10*3/uL (ref 0.0–0.1)
Basophils Relative: 0 %
Eosinophils Absolute: 0 10*3/uL (ref 0.0–0.5)
Eosinophils Relative: 0 %
HCT: 16.1 % — ABNORMAL LOW (ref 39.0–52.0)
Hemoglobin: 4.7 g/dL — CL (ref 13.0–17.0)
Lymphocytes Relative: 12 %
Lymphs Abs: 0.7 10*3/uL (ref 0.7–4.0)
MCH: 31.3 pg (ref 26.0–34.0)
MCHC: 29.2 g/dL — ABNORMAL LOW (ref 30.0–36.0)
MCV: 107.3 fL — ABNORMAL HIGH (ref 80.0–100.0)
Monocytes Absolute: 0.1 10*3/uL (ref 0.1–1.0)
Monocytes Relative: 2 %
Neutro Abs: 4.9 10*3/uL (ref 1.7–7.7)
Neutrophils Relative %: 86 %
Platelets: 180 10*3/uL (ref 150–400)
RBC: 1.5 MIL/uL — ABNORMAL LOW (ref 4.22–5.81)
RDW: 18.1 % — ABNORMAL HIGH (ref 11.5–15.5)
WBC: 5.7 10*3/uL (ref 4.0–10.5)
nRBC: 0 % (ref 0.0–0.2)
nRBC: 0 /100 WBC

## 2020-11-24 LAB — PROTIME-INR
INR: 1.2 (ref 0.8–1.2)
Prothrombin Time: 14.7 seconds (ref 11.4–15.2)

## 2020-11-24 LAB — PREPARE RBC (CROSSMATCH)

## 2020-11-24 LAB — I-STAT VENOUS BLOOD GAS, ED
Acid-Base Excess: 4 mmol/L — ABNORMAL HIGH (ref 0.0–2.0)
Bicarbonate: 26.4 mmol/L (ref 20.0–28.0)
Calcium, Ion: 0.88 mmol/L — CL (ref 1.15–1.40)
HCT: 15 % — ABNORMAL LOW (ref 39.0–52.0)
Hemoglobin: 5.1 g/dL — CL (ref 13.0–17.0)
O2 Saturation: 95 %
Potassium: 3.6 mmol/L (ref 3.5–5.1)
Sodium: 142 mmol/L (ref 135–145)
TCO2: 27 mmol/L (ref 22–32)
pCO2, Ven: 30.1 mmHg — ABNORMAL LOW (ref 44.0–60.0)
pH, Ven: 7.551 — ABNORMAL HIGH (ref 7.250–7.430)
pO2, Ven: 65 mmHg — ABNORMAL HIGH (ref 32.0–45.0)

## 2020-11-24 LAB — GLUCOSE, CAPILLARY: Glucose-Capillary: 96 mg/dL (ref 70–99)

## 2020-11-24 LAB — SARS CORONAVIRUS 2 BY RT PCR (HOSPITAL ORDER, PERFORMED IN ~~LOC~~ HOSPITAL LAB): SARS Coronavirus 2: POSITIVE — AB

## 2020-11-24 LAB — POC OCCULT BLOOD, ED: Fecal Occult Bld: POSITIVE — AB

## 2020-11-24 LAB — TROPONIN I (HIGH SENSITIVITY): Troponin I (High Sensitivity): 73 ng/L — ABNORMAL HIGH (ref <18)

## 2020-11-24 MED ORDER — BICTEGRAVIR-EMTRICITAB-TENOFOV 50-200-25 MG PO TABS
1.0000 | ORAL_TABLET | Freq: Every day | ORAL | Status: DC
  Administered 2020-11-25: 1 via ORAL
  Filled 2020-11-24: qty 1

## 2020-11-24 MED ORDER — UMECLIDINIUM-VILANTEROL 62.5-25 MCG/INH IN AEPB
1.0000 | INHALATION_SPRAY | Freq: Every day | RESPIRATORY_TRACT | Status: DC
  Administered 2020-11-25: 1 via RESPIRATORY_TRACT
  Filled 2020-11-24: qty 14

## 2020-11-24 MED ORDER — INSULIN ASPART 100 UNIT/ML ~~LOC~~ SOLN: 0.0000 [IU] | Freq: Four times a day (QID) | SUBCUTANEOUS | Status: DC

## 2020-11-24 MED ORDER — LATANOPROST 0.005 % OP SOLN
1.0000 [drp] | Freq: Every day | OPHTHALMIC | Status: DC
  Administered 2020-11-24: 1 [drp] via OPHTHALMIC
  Filled 2020-11-24: qty 2.5

## 2020-11-24 MED ORDER — TIMOLOL MALEATE 0.5 % OP SOLN
1.0000 [drp] | Freq: Every day | OPHTHALMIC | Status: DC
  Administered 2020-11-24 – 2020-11-25 (×2): 1 [drp] via OPHTHALMIC
  Filled 2020-11-24: qty 5

## 2020-11-24 MED ORDER — ALBUTEROL SULFATE HFA 108 (90 BASE) MCG/ACT IN AERS
1.0000 | INHALATION_SPRAY | RESPIRATORY_TRACT | Status: DC | PRN
  Filled 2020-11-24: qty 6.7

## 2020-11-24 MED ORDER — PANTOPRAZOLE SODIUM 40 MG IV SOLR
40.0000 mg | Freq: Once | INTRAVENOUS | Status: AC
  Administered 2020-11-24: 40 mg via INTRAVENOUS
  Filled 2020-11-24: qty 40

## 2020-11-24 MED ORDER — SODIUM CHLORIDE 0.9 % IV SOLN: 10.0000 mL/h | Freq: Once | INTRAVENOUS | Status: DC

## 2020-11-24 MED ORDER — LORAZEPAM 2 MG/ML IJ SOLN: 0.5000 mg | INTRAMUSCULAR | Status: DC | PRN

## 2020-11-24 MED ORDER — PANTOPRAZOLE SODIUM 40 MG IV SOLR
40.0000 mg | Freq: Two times a day (BID) | INTRAVENOUS | Status: DC
  Administered 2020-11-25: 40 mg via INTRAVENOUS
  Filled 2020-11-24: qty 40

## 2020-11-24 NOTE — ED Triage Notes (Signed)
Pt BIB EMS from home due to weakness. Per patient he called EMS because he felt dizzy and weak. Pt is dialysis patient M&W&F. Pt had dialysis yesterday full session. EMS reports pt was very difficult to wake up. Pt states he took tramadol at midnight and again at 12pm. Pt reports that is usually has he take it but this time he had a different response to it.

## 2020-11-24 NOTE — H&P (Signed)
History and Physical    PLEASE NOTE THAT [employer]AGON DICT[employer] SOFTWARE WAS USED IN THE CONSTRUCTION OF THIS NOTE.   Jacob [name] MOQ:[id number] DOB: [birth year] DOA: 11/24/2020  PCP: [name], NP Patient coming from: home   I have personally briefly reviewed patient's old medical records in [place]  Chief Complaint: Dark-colored stool  HPI: Jacob [name] is a 70 y.o. male with medical history significant for coronary artery disease status post CABG in 2004 followed by placement drug-eluting stent February 3546, chronic systolic/diastolic heart failure, with echocardiogram in December 2021 showing LVEF 40 to 45%, end-stage renal disease on hemodialysis on Monday, Wednesday, Friday schedule, upper GI bleeds requiring blood transfusion, anemia chronic kidney disease with baseline hemoglobin 7-9, type 2 diabetes mellitus complicated by polyperipheral neuropathy, HIV, GERD, COPD daily alcohol consumption, who is admitted to [facility] on 11/24/2020 with acute upper GI bleed after presenting from home to [facility] Emergency Department complaining of dark-colored stool.   The patient reports 2 days of dark-colored stool, which he states is new for him, and denies any associated hematochezia.  Denies any associated abdominal pain, diarrhea, nausea, vomiting, hematemesis.  Denies any recent trauma or travel.  Denies any associated subjective fever, chills, rigors, or generalized myalgias.  In the setting of placement of drug-eluting stent 1 year ago in February 2021, he is on Brilinta, but is otherwise on no blood thinning agents at home, including no aspirin.  He reports that this presenting dark-colored stool in the absence of additional symptoms, is similar to that which she has experienced at the time of previous episodes of acute upper gastrointestinal bleed.   He denies any recent chest pain, shortness of breath, palpitations, diaphoresis, dizziness, presyncope, or syncope.  Over the  last week denies any associated headache, neck stiffness, rhinitis, rhinorrhea, sore throat, wheezing, cough, or rash.  Denies any known COVID-19 exposures.  Denies any systemic NSAID use.  He reports that he typically consumes 1-2 beers per day, but denies any routine consumption in excess of this.  Most recent prior consumption of alcohol occurred yesterday.  Denies any known underlying liver disease.   Per chart review, it appears that the patient's patient has a history of prior acute upper gastrointestinal bleeding, in the setting of gastric AVM.  Of note, EGD performed in February 2021 was notable for angiodysplastic lesions with evidence of active bleeding, requiring argon plasma, clipping, and epinephrine injection.   Medical history is also notable for end-stage renal disease on hemodialysis on Monday, Wednesday, Friday schedule, with the patient reporting he attended and completed a full hemodialysis session yesterday (11/23/20).     ED Course:  Vital signs in the ED were notable for the following: Temperature max 98.2; heart rate 77-80; blood pressure 108/55 - 114/58; respiratory rate 14-25; oxygen saturation 96% on room air.  Labs were notable for the following: BMP was notable for the following: Sodium 141, potassium 3.7, bicarbonate 24, glucose 146.  CBC was notable for white cell count of 5700, hemoglobin 4.7 with macrocytic hypochromic findings as well as elevated RDW, by way of comparison to most recent prior hemoglobin value of 9.8 on 11/06/2020, platelets 180.  INR 1.2.  Screening nasopharyngeal COVID-19 PCR was performed in the ED this evening and found to be positive, relative to negative COVID-19 PCR performed on 11/05/2020.  EKG, compared to most recent prior on 11/05/2020, showed sinus rhythm with multiple PVCs, ventricular rate 86, right bundle branch block, T wave inversion in  leads V1 through V4, which is unchanged from most recent prior EKG, no evidence of ST changes, including  no evidence of ST elevation.  Fecal occult blood test performed in the ED was found to be positive, with evidence of dark-colored stool in the rectal vault.  The emergency department physician, Dr. Dina Rich, discussed the patient's case with the on-call gastroenterologist, Dr. Ardis Hughs of [place], who will formally consult and plans to evaluate the patient in person.  In the meantime, Dr. Ardis Hughs recommends holding home Brilinta and starting IV PPI twice daily. Dr. Jeneen Rinks did not feel SBP prophylaxis was warranted at this time in the absence of any known underlying liver disease, and also felt that octreotide was not indicated at the present time.   While in the ED, the following were administered: Protonix 40 mg IV x1; the patient was typed and screened for 2 units PBC, with initiation of transfusion of the first such unit.    Review of Systems: As per HPI otherwise 10 point review of systems negative.   Past Medical History:  Diagnosis Date  . Acute respiratory failure ([place]) 03/01/2018  . Arthritis    "all over; mostly knees and back" (02/28/2018)  . Chronic lower back pain    stenosis  . Community acquired pneumonia 09/06/2013  . COPD (chronic obstructive pulmonary disease) ([place])   . Coronary atherosclerosis of native coronary artery    a. 02/2003 s/p CABG x 2 (VG->RI, VG->RPDA; b. 11/2019 PCI: LM nl, LAD 90d, D3 50, RI 100, LCX 100p, OM3 100 - fills via L->L collats from D2/dLAD, RCA 100p, VG->RPDA ok, VG->RI 95 (3.5x48 Synergy XD DES).  . Drug abuse ([place])    hx; tested for cocaine as recently as 2/08. says he is not using drugs now - avoided defib. for this reason   . ESRD (end stage renal disease) ([place])    Hemo M-W-F- Richarda Blade  . Fall at home 10/2020  . GERD (gastroesophageal reflux disease)    takes OTC meds as needed  . GI bleeding    a. 11/2019 EGD: angiodysplastic lesions w/ bleeding s/p argon plasma/clipping/epi inj.  . Glaucoma    uses eye drops daily  . Hepatitis B 1968   "tx'd  w/isolation; caught it from toilet stools in gym"  . HFrEF (heart failure with reduced ejection fraction) ([place])    a. 01/2019 Echo: EF 40-45%, diffuse HK, mild basal septal hypertrophy.  . History of blood transfusion 03/01/2019  . History of colon polyps    benign  . History of gout    takes Allopurinol daily as well as Colchicine-if needed (02/28/2018)  . History of kidney stones   . HTN (hypertension)    takes Coreg,Imdur.and Apresoline daily  . Human immunodeficiency virus (HIV) disease ([place]) dx'd 1995   takes Genvoya daily  . Hyperlipidemia    takes Atorvastatin daily  . Ischemic cardiomyopathy    a. 01/2019 Echo: EF 40-45%, diffuse HK, mild basal septal hypertrophy. Diast dysfxn. Nl RV size/fxn. Sev dil LA. Triv MR/TR/PR.  [name] Muscle spasm    takes Zanaflex as needed  . Myocardial infarction ([place]) ~ 2004/2005  . Nocturia   . Peripheral neuropathy    takes gabapentin daily  . Pneumonia    "at least twice" (02/28/2018)  . Shortness of breath dyspnea    rarely but if notices it then with exertion  . Syphilis, unspecified   . Type II diabetes mellitus ([place]) 2004   Lantus daily.Average fasting blood sugar 125-199  .  Wears glasses   . Wears partial dentures     Past Surgical History:  Procedure Laterality Date  . AV FISTULA PLACEMENT Left 08/02/2018   Procedure: ARTERIOVENOUS (AV) FISTULA CREATION  left arm radiocephlic;  Surgeon: Marty Heck, MD;  Location: [place];  Service: Vascular;  Laterality: Left;  . AV FISTULA PLACEMENT Left 08/01/2019   Procedure: LEFT BRACHIOCEPHALIC ARTERIOVENOUS (AV) FISTULA CREATION;  Surgeon: [name], MD;  Location: [place];  Service: Vascular;  Laterality: Left;  . BASCILIC VEIN TRANSPOSITION Left 10/03/2019   Procedure: BASILIC VEIN TRANSPOSITION LEFT SECOND STAGE;  Surgeon: [name], MD;  Location: [place];  Service: Vascular;  Laterality: Left;  . CARDIAC CATHETERIZATION  10/2002; 12/19/2004   [name] 03/08/2011  . COLONOSCOPY  2013    Dr.John Henrene Pastor   . CORONARY ARTERY BYPASS GRAFT  02/24/2003   CABG X2/notes 03/08/2011  . CORONARY STENT INTERVENTION N/A 12/19/2019   Procedure: CORONARY STENT INTERVENTION;  Surgeon: Jettie Booze, MD;  Location: [place] CV LAB;  Service: Cardiovascular;  Laterality: N/A;  . ESOPHAGOGASTRODUODENOSCOPY (EGD) WITH PROPOFOL N/A 02/08/2019   Procedure: ESOPHAGOGASTRODUODENOSCOPY (EGD) WITH PROPOFOL;  Surgeon: Milus Banister, MD;  Location: [place];  Service: Gastroenterology;  Laterality: N/A;  . ESOPHAGOGASTRODUODENOSCOPY (EGD) WITH PROPOFOL N/A 12/22/2019   Procedure: ESOPHAGOGASTRODUODENOSCOPY (EGD) WITH PROPOFOL;  Surgeon: Lavena Bullion, DO;  Location: [place];  Service: Gastroenterology;  Laterality: N/A;  . ESOPHAGOGASTRODUODENOSCOPY (EGD) WITH PROPOFOL N/A 10/19/2020   Procedure: ESOPHAGOGASTRODUODENOSCOPY (EGD) WITH PROPOFOL;  Surgeon: Jackquline Denmark, MD;  Location: [facility] ENDOSCOPY;  Service: Endoscopy;  Laterality: N/A;  . HEMOSTASIS CLIP PLACEMENT  12/22/2019   Procedure: HEMOSTASIS CLIP PLACEMENT;  Surgeon: Lavena Bullion, DO;  Location: [place];  Service: Gastroenterology;;  . HOT HEMOSTASIS N/A 02/08/2019   Procedure: HOT HEMOSTASIS (ARGON PLASMA COAGULATION/BICAP);  Surgeon: Milus Banister, MD;  Location: [facility] ENDOSCOPY;  Service: Gastroenterology;  Laterality: N/A;  . HOT HEMOSTASIS N/A 12/22/2019   Procedure: HOT HEMOSTASIS (ARGON PLASMA COAGULATION/BICAP);  Surgeon: Lavena Bullion, DO;  Location: [facility] ENDOSCOPY;  Service: Gastroenterology;  Laterality: N/A;  . HOT HEMOSTASIS N/A 10/19/2020   Procedure: HOT HEMOSTASIS (ARGON PLASMA COAGULATION/BICAP);  Surgeon: Jackquline Denmark, MD;  Location: [facility] ENDOSCOPY;  Service: Endoscopy;  Laterality: N/A;  . INTERTROCHANTERIC HIP FRACTURE SURGERY Left 11/2006   [name] 03/08/2011  . INTRAVASCULAR ULTRASOUND/IVUS N/A 12/19/2019   Procedure: Intravascular Ultrasound/IVUS;  Surgeon: Jettie Booze, MD;  Location: [place] CV LAB;  Service: Cardiovascular;  Laterality: N/A;  . IR FLUORO GUIDE CV LINE RIGHT  07/24/2019  . IR FLUORO GUIDE CV LINE RIGHT  07/30/2019  . IR US GUIDE VASC ACCESS RIGHT  07/24/2019  . IR US GUIDE VASC ACCESS RIGHT  07/30/2019  . LAPAROSCOPIC CHOLECYSTECTOMY  05/2006  . LIGATION OF COMPETING BRANCHES OF ARTERIOVENOUS FISTULA Left 11/05/2018   Procedure: LIGATION OF COMPETING BRANCHES OF ARTERIOVENOUS FISTULA  LEFT  ARM;  Surgeon: Marty Heck, MD;  Location: [place];  Service: Vascular;  Laterality: Left;  . LUMBAR LAMINECTOMY/DECOMPRESSION MICRODISCECTOMY N/A 02/29/2016   Procedure: Left L4-5 Lateral Recess Decompression, Removal Extradural Intraspinal Facet Cyst;  Surgeon: Marybelle Killings, MD;  Location: [place];  Service: Orthopedics;  Laterality: N/A;  . MULTIPLE TOOTH EXTRACTIONS    . ORIF MANDIBULAR FRACTURE Left 08/13/2004   ORIF of left body fracture mandible with KLS Martin 2.3-mm six hole/notes 03/08/2011  . RIGHT/LEFT HEART CATH AND CORONARY/GRAFT ANGIOGRAPHY N/A 12/19/2019   Procedure: RIGHT/LEFT HEART CATH  AND CORONARY/GRAFT ANGIOGRAPHY;  Surgeon: Corky Crafts, MD;  Location: [facility] INVASIVE CV LAB;  Service: Cardiovascular;  Laterality: N/A;  . SCLEROTHERAPY  12/22/2019   Procedure: SCLEROTHERAPY;  Surgeon: Shellia Cleverly, DO;  Location: [facility] ENDOSCOPY;  Service: Gastroenterology;;    Social History:  reports that he has been smoking cigarettes. He has a 21.50 pack-year smoking history. He has never used smokeless tobacco. He reports current alcohol use of about 12.0 standard drinks of alcohol per week.  Drug: Cocaine.   Allergies  Allergen Reactions  . Augmentin [Amoxicillin-Pot Clavulanate] Diarrhea    Severe diarrhea  . Amphetamines Other (See Comments)    Unknown reaction type    Family History  Problem Relation Age of Onset  . Heart failure Father   . Hypertension Father   . Diabetes Brother   . Heart attack Brother   . Alzheimer's disease Mother    . Stroke Sister   . Diabetes Sister   . Alzheimer's disease Sister   . Hypertension Brother   . Diabetes Brother   . Drug abuse Brother   . Colon cancer Neg Hx      Prior to Admission medications   Medication Sig Start Date End Date Taking? Authorizing Provider  acetaminophen (TYLENOL) 650 MG CR tablet Take 650 mg by mouth 3 (three) times daily.    [provider]  allopurinol (ZYLOPRIM) 100 MG tablet TAKE 1 TABLET(100 MG) BY MOUTH DAILY Patient taking differently: Take 100 mg by mouth daily. 07/23/20   Sharon Seller, NP  ANORO ELLIPTA 62.5-25 MCG/INH AEPB INHALE 1 PUFF BY MOUTH EVERY DAY Patient taking differently: Inhale 1 puff into the lungs daily. 12/11/19   [name], Dinah C, NP  B-D UF III MINI PEN NEEDLES 31G X 5 MM MISC USE FOUR TIMES DAILY 03/02/20   Sharon Seller, NP  bictegravir-emtricitabine-tenofovir AF (BIKTARVY) 50-200-25 MG TABS tablet Take 1 tablet by mouth daily. 06/30/20   [name], MD  carvedilol (COREG) 3.125 MG tablet Take 1 tablet (3.125 mg total) by mouth 2 (two) times daily. 02/13/20   [name], MD  Cinacalcet HCl (SENSIPAR PO) Take by mouth.  08/14/20 08/13/21  [provider]  colchicine 0.6 MG tablet Take 0.6-1.2 mg by mouth daily as needed (gout flare-up).    [provider]  diclofenac Sodium (VOLTAREN) 1 % GEL APPLY 2 GRAMS TO THE AFFECTED AREA THREE TIMES DAILY AS NEEDED FOR PAIN 11/16/20   Sharon Seller, NP  folic acid (FOLVITE) 1 MG tablet Take 1 tablet (1 mg total) by mouth daily. 11/08/20   Glade Lloyd, MD  gabapentin (NEURONTIN) 300 MG capsule Take 1 capsule (300 mg total) by mouth 2 (two) times daily. 09/22/20   [name], Dinah C, NP  insulin lispro (HUMALOG KWIKPEN) 100 UNIT/ML KwikPen Inject 0.71ml (20 Units total) into the skin 3 (three) times daily as needed for high blood sugar (above 150) Patient taking differently: Inject 20 Units into the skin 3 (three) times daily as needed (blood sugar >150  mg/dl). 04/11/14   Sharon Seller, NP  iron sucrose in sodium chloride 0.9 % 100 mL Iron Sucrose (Venofer) 07/08/20 07/07/21  [provider]  isosorbide mononitrate (IMDUR) 30 MG 24 hr tablet TAKE 1 TABLET BY MOUTH EVERY DAY Patient taking differently: Take 30 mg by mouth daily. TAKE 1 TABLET BY MOUTH EVERY DAY 08/10/20   Sharon Seller, NP  Lancets [facility] DELICA PLUS [place]) MISC Inject 1 Device as directed 3 (three)  times daily. Dx: E11.40 09/18/19   [name], NP  latanoprost (XALATAN) 0.005 % ophthalmic solution Place 1 drop into both eyes at bedtime.    [provider]  Methoxy PEG-Epoetin Beta (MIRCERA IJ) Mircera 07/06/20   [provider]  multivitamin (RENA-VIT) TABS tablet Take 1 tablet by mouth daily.    [provider]  nitroGLYCERIN (NITROSTAT) 0.3 MG SL tablet ONE TABLET UNDER TONGUE AS NEEDED FOR CHEST PAIN Patient taking differently: Place 0.3 mg under the tongue every 5 (five) minutes as needed for chest pain (max 3 doses). 05/01/19   [name], MD  Nutritional Supplements (VITAMIN D BOOSTER PO) Take 1 each by mouth 2 (two) times daily. 08/12/20 08/11/21  [provider]  ONETOUCH ULTRA test strip CHECK BLOOD SUGAR 3 TIMES  DAILY 05/27/20   [name], NP  oxyCODONE-acetaminophen (PERCOCET/ROXICET) 5-325 MG tablet Take 1 tablet by mouth every 8 (eight) hours as needed for severe pain. 11/12/20   Marybelle Killings, MD  pantoprazole (PROTONIX) 40 MG tablet TAKE 1 TABLET(40 MG) BY MOUTH TWICE DAILY Patient taking differently: Take 40 mg by mouth 2 (two) times daily. 10/19/20   Fayrene Helper, MD  polyethylene glycol (MIRALAX / GLYCOLAX) 17 g packet Take 17 g by mouth daily as needed for mild constipation. 11/07/20   Aline August, MD  sevelamer carbonate (RENVELA) 800 MG tablet Take 800 mg by mouth 3 (three) times daily with meals.    [provider]  thiamine 100 MG tablet Take 1 tablet (100 mg total)  by mouth daily. 11/08/20   Aline August, MD  ticagrelor (BRILINTA) 90 MG TABS tablet Take 90 mg by mouth 2 (two) times daily.    [provider]  timolol (TIMOPTIC) 0.5 % ophthalmic solution Place 1 drop into both eyes daily. 04/04/19   [provider]  traMADol (ULTRAM) 50 MG tablet Take 1 tablet (50 mg total) by mouth 2 (two) times daily as needed. 11/20/20   Marybelle Killings, MD     Objective    Physical Exam: Vitals:   11/24/20 1511 11/24/20 1600  BP: (!) 114/58 (!) 108/55  Pulse: 77 80  Resp: (!) 25 14  Temp: 98.2 F (36.8 C)   TempSrc: Oral   SpO2: 96% 96%    General: appears to be stated age; alert, oriented Skin: warm, dry, no rash Head:  AT/[place] Mouth:  Oral mucosa membranes appear dry, normal dentition Neck: supple; trachea midline Heart:  RRR; did not appreciate any M/R/G Lungs: CTAB, did not appreciate any wheezes, rales, or rhonchi Abdomen: + BS; soft, ND, NT Vascular: 2+ pedal pulses b/l; 2+ radial pulses b/l Extremities: no peripheral edema, no muscle wasting Neuro: strength and sensation intact in upper and lower extremities b/l   Labs on Admission: I have personally reviewed following labs and imaging studies  CBC: Recent Labs  Lab 11/24/20 1525 11/24/20 1606  WBC 5.7  --   NEUTROABS 4.9  --   HGB 4.7* 5.1*  HCT 16.1* 15.0*  MCV 107.3*  --   PLT 180  --    Basic Metabolic Panel: Recent Labs  Lab 11/24/20 1525 11/24/20 1606  NA 141 142  K 3.7 3.6  CL 99  --   CO2 24  --   GLUCOSE 146*  --   BUN 89*  --   CREATININE 6.13*  --   CALCIUM 7.6*  --    GFR: CrCl cannot be calculated (Unknown ideal weight.).  Liver Function Tests: No results for input(s): AST, ALT, ALKPHOS, BILITOT, PROT, ALBUMIN in the last 168 hours. No results for input(s): LIPASE, AMYLASE in the last 168 hours. No results for input(s): AMMONIA in the last 168 hours. Coagulation Profile: Recent Labs  Lab 11/24/20 1525  INR 1.2   Cardiac Enzymes: No  results for input(s): CKTOTAL, CKMB, CKMBINDEX, TROPONINI in the last 168 hours. BNP (last 3 results) No results for input(s): PROBNP in the last 8760 hours. HbA1C: No results for input(s): HGBA1C in the last 72 hours. CBG: No results for input(s): GLUCAP in the last 168 hours. Lipid Profile: No results for input(s): CHOL, HDL, LDLCALC, TRIG, CHOLHDL, LDLDIRECT in the last 72 hours. Thyroid Function Tests: No results for input(s): TSH, T4TOTAL, FREET4, T3FREE, THYROIDAB in the last 72 hours. Anemia Panel: No results for input(s): VITAMINB12, FOLATE, FERRITIN, TIBC, IRON, RETICCTPCT in the last 72 hours. Urine analysis:    Component Value Date/Time   COLORURINE YELLOW 10/16/2020 2136   APPEARANCEUR CLEAR 10/16/2020 2136   LABSPEC 1.016 10/16/2020 2136   PHURINE 5.0 10/16/2020 2136   GLUCOSEU NEGATIVE 10/16/2020 2136   GLUCOSEU NEG mg/dL 09/20/2010 2058   HGBUR SMALL (A) 10/16/2020 2136   HGBUR negative 05/31/2010 0948   BILIRUBINUR NEGATIVE 10/16/2020 2136   [place] NEGATIVE 10/16/2020 2136   PROTEINUR 30 (A) 10/16/2020 2136   UROBILINOGEN 0.2 06/24/2015 2336   NITRITE NEGATIVE 10/16/2020 2136   LEUKOCYTESUR NEGATIVE 10/16/2020 2136    Radiological Exams on Admission: No results found.   EKG: Independently reviewed, with result as described above.    Assessment/Plan   Jacob [name] is a 70 y.o. male with medical history significant for coronary artery disease status post CABG in 2004 followed by placement drug-eluting stent February 1610, chronic systolic/diastolic heart failure, with echocardiogram in December 2021 showing LVEF 40 to 45%, end-stage renal disease on hemodialysis on Monday, Wednesday, Friday schedule, upper GI bleeds requiring blood transfusion, anemia chronic kidney disease with baseline hemoglobin 7-9, type 2 diabetes mellitus complicated by polyperipheral neuropathy, HIV, GERD, COPD daily alcohol consumption, who is admitted to [facility] on  11/24/2020 with acute upper GI bleed after presenting from home to [facility] Emergency Department complaining of dark-colored stool.     Principal Problem:   Acute upper GI bleed Active Problems:   Human immunodeficiency virus (HIV) disease (HCC)   TOBACCO ABUSE   Chronic combined systolic (congestive) and diastolic (congestive) heart failure (HCC)   Ischemic cardiomyopathy   Type 2 diabetes mellitus with diabetic polyneuropathy, with long-term current use of insulin (HCC)   Chronic obstructive pulmonary disease (HCC)   ESRD on hemodialysis ([place])   COVID-19 virus infection       #) Acute Upper GI Bleed: diagnosis on the basis of patient's report of 2 days of new onset dark-colored stool, with fecal occult blood test performed in the ED today found to be positive, concomitant with presenting acute on chronic anemia, as further quantified below.  In the setting of placement drug-eluting stent 1 year ago, the patient is on Brilinta, but otherwise on no blood thinning agents at home, including aspirin.  Denies any NSAID use.  No known history of liver disease.  Reports typical consumption 1-2 beers per day.  Has a history of prior upper gastrointestinal bleeds requiring blood transfusion, with history of gastric AVMs and EGD performed in February 2021 showing angioplastic lesions with evidence of active bleeding, requiring management with argon plasma, clipping, and epinephrine injection.  Of note,  utility of presenting BUN level is limited in the context of end-stage renal disease on hemodialysis.  Particularly given daily consumption of alcohol, differential includes esophagitis, gastritis, peptic ulcer disease, as well as known history of gastric AVMs and angiodysplasia.  Given relative hemodynamic stability at this time, presentation does not appear to be consistent with variceal bleed at this point.  Consequently, will refrain from initiation of octreotide at this time. In the absence of known liver  disease, initiation of SBP prophylaxis not appear to be warranted.  At this time, the patient appears hemodynamically stable, with normotensive blood pressures in the absence of any associated tachycardia.  Aside from the presenting dark-colored stool, the patient is otherwise asymptomatic, denying any nausea, vomiting, abdominal pain, chest pain, shortness of breath, palpitations, or dizziness.  Case was discussed with the on-call gastroenterologist, Dr. Ardis Hughs of [place], who will formally consult and plans to evaluate the patient in person.  In the meantime, Dr. Ardis Hughs recommends holding home Brilinta and starting IV PPI twice daily.   Patient was typed and screened for 2 units PBC while in the ED. we will initiate transfusion of first unit at this time, over the course of 2 hours, refraining from faster infusion at this time in the context of hemodynamic stability as well as in the setting of known history of end-stage renal disease on hemodialysis as well as chronic combined heart failure.  Close monitoring of serial H&H values, as further described low.       Plan: NPO. Refraining from pharmacologic DVT prophylaxis.  Per gastroenterology consult, will hold home Brilinta and start Protonix 40 mg IV twice daily, while awaiting additional recommendations following formal evaluation by Dr. Ardis Hughs of GI.  Monitor on telemetry. Monitor continuous pulse-ox. Maintain at least 2 large bore IV's. Check INR in the AM. Q4H H&H's have been ordered through 9 AM tomorrow morning, 11/25/2020. Will closely monitor these ensuing Hgb levels and correlate these data points with the patient's overall clinical picture including vital signs to determine need for subsequent transfusion.  Additional work-up and management of associated acute on chronic anemia, as further described below.  Repeat CMP in the morning.      #) Acute blood loss anemia superimposed on anemia of chronic kidney disease: In the setting of  baseline hemoglobin range of 7-9, presenting hemoglobin noted to be 4.7 relative to most recent prior value of 9.8 on 11/06/2020 in the setting of suspected acute upper GI bleed, as above, and further supported by finding of hypochromic and elevated RDW laboratory values at presentation.. At this time, patient appears hemodynamically stable and asymptomatic, as further described above.  We will add on iron studies as well as additional laboratory evaluation for patient's anemia, as further described below.  Transfusion of 2 units PRBC to be initiated now, with first unit administered over 2 hours, per rationale above.  I have ordered a repeat H&H to be checked following completion of transfusion of the second unit PRBC.     Plan: work-up and management for presenting suspected acute upper GI bleed, as above, including close monitoring of Q4H H&H's.transfusion of 2 units PRBC, as further described above .with clinical evaluation for determination of need for blood transfusion, as further described above. Monitor on telemetry. Monitor continuous pulse-ox. NPO. Refraining from pharmacologic DVT prophylaxis. Check INR in the morning.  GI formally consulted, with plan to evaluate the patient in person for need for endoscopic evaluation.  Holding home Brilinta, per GI recommendations, as above.  We will add the following labs onto the pretransfusion specimens: Ferritin, TIBC, total iron, MMA, folic acid, reticulocyte count, and peripheral smear.       #) positive COVID-19 result: routine screening COVID-19 testing performed in the ED today was found to be positive, relative to negative COVID-19 PCR performed on 11/05/2020. This result is in the context of no recent respiratory symptoms and no additional recent symptoms felt to be consistent with COVID-19 infection, as the patient's only symptom at this time appears to be his presenting complaint of 2 days of dark-colored stool in the setting of presenting acute  upper gastrointestinal bleed, as above. Of note, this positive COVID-19 finding does not represent the reason for admission. Presentation does not appear to be associated with acute hypoxic respiratory distress/failure, with patient maintaining O2 sats greater than 94% on room air. Consequently, there does not appear to be an indication at this time for initiation of dexamethasone per treatment guidance recommendations from [facility]'s Covid Treatment Guidelines.   As the patient is being admitted for a completely different reason relative to the positive COVID-19 screening result, and as this patient appears asymptomatic from a COVID-19 standpoint, criteria do not appear to be met for initiation of a 3 day course of remdesivir at this time per treatment guidance recommendations from [facility]'s Covid Treatment Guidelines.  We will closely monitor for ensuing development of respiratory symptoms or evidence of acute hypoxic respiratory distress, particularly in the setting of criteria for high-risk qualifiers, including the patient's age greater than 71 as well as multiple comorbidities.  Will initiate supportive care, as further described below.  No known COVID-19 exposures.  Has received 2 doses of the Pfizer COVID-19 vaccine.    Plan: Check chest x-ray.  Add on gentle amatory markers, with plan to repeat in the morning for means of interval trend . Airborne and contact precautions. Monitor continuous pulse oximetry, with close monitoring for development of new supplemental oxygen requirement, as above. prn supplemental O2 to maintain O2 sats greater than or equal to 94%. Refraining from initiation of dexamethasone and remdesivir, as above.  Add on procalcitonin level.  Repeat CBC in the morning.  As needed albuterol.      #) Chronic systolic/diastolic heart failure: With most recent echocardiogram in December 2021 moderate the dilatedly left ventricle, LVEF 40 to 45%, mild to moderate concentric  LVH, and grade 2 diastolic dysfunction.  Felt to be on the basis of ischemic cardiomyopathy in the setting of known history of coronary artery disease.  Is an uric in the setting of end-stage renal disease on hemodialysis.  No clinical evidence to suggest acute decompensated heart failure at this time, although will closely monitor for development of ensuing evidence of acute volume overload given plan for transfusion of multiple units PVC in the setting of presenting acute upper gastrointestinal bleed, as further described above.  Plan: Monitor strict I's and O's and daily weights.  Monitor on continuous pulse oximetry.  Add on serum magnesium level.  Repeat BMP in the morning.  In the setting of presenting acute gastrointestinal bleed, will also hold home Coreg for now, as well as the patient's additional home antihypertensive medications, which include Imdur.     #) End-stage renal disease: On hemodialysis on a Monday, Wednesday, Friday schedule, with most recent hemodialysis session occurring yesterday, which was noted to be a complete session.  Outpatient medical management includes Renvela and Sensipar, which will initially be held in the setting of n.p.o. status context  of presenting acute upper gastrointestinal bleed.  Plan: Monitor strict I's and O's and daily weights.  Repeat BMP in the morning.  Add on serum magnesium level plan to repeat this level in the morning.  Check serum phosphorus level as well as ionized calcium level.  Holding home Renvela and Sensipar for now in the setting of current n.p.o. status, as above.  Close monitoring for development of acute volume overload, in the setting of plan for transfusion of 2 units PRBC, as above.  Depending upon the duration of his hospitalization, may need to consult nephrology for assistance with initiation of hemodialysis during this hospitalization.       #) Chronic alcohol abuse: The patient reports that he consumes 1-2 beers on a daily  basis, without any recent significant increases from this typical volume.  He reports that most recent consumption of alcohol occurred yesterday, denies any history of alcohol withdrawal symptoms when temporarily discontinuing his alcohol consumption.  Denies any known underlying liver disease.  Plan: Given the patient's report of minimal daily alcohol consumption I have refrained from initiation of CIWA protocol for now.  Following resolution of current n.p.o. status, will resume thiamine and multivitamin.       #) Type 2 diabetes mellitus: Complicated by polyperipheral neuropathy.  Does not appear to be on any basal insulin as an outpatient but rather is on Humalog 20 units subcu 3 times daily, which the patient reports that he takes on a as needed basis for preprandial blood sugars in excess of 150.  Blood sugar per presenting BMP noted to be 146.  In the context of current abuses, will refrain from scheduling a short acting insulin at this time, but rather proceed with a sliding scale evaluation, as below.  Plan: Hold home Humalog for now.  Monitor Accu-Cheks before every meal and at bedtime with very low-dose sliding scale insulin.       #) HIV: On bictegravir-emtricitabine-tenofovir as an outpatient.  Plan: Will attempt additional chart review to further evaluate most recent CD4 counts.  We will continue home bictegravir-emtricitabine-tenofovir.       #) COPD: In the context of a long smoking history, the patient has a documented history of COPD, for which she is on the following respiratory regimen at home: [place].  No evidence of acute exacerbation of underlying COPD at this time.  We will closely monitor patient's respiratory status, particularly the setting of positive nasopharyngeal COVID-19 PCR finding, as further outlined above.  Plan: Continue home Anoro Ellipta.  Will counseled the patient on the importance of complete smoking discontinuation in the setting of his  underlying history of COPD.  As needed albuterol inhaler ordered.  Monitor continuous pulse oximetry.     #) Chronic tobacco abuse: The patient reports that he is a current smoker, having smoked half pack per day for at least the last 40 years.  Plan: Counseled the patient on the importance of complete smoking discontinuation given the presence of multiple comorbidities, including that of a history of coronary artery disease as well as underlying COPD.     DVT prophylaxis: SCDs Code Status: Full code Family Communication: none Disposition Plan: Per Rounding Team Consults called: Patient's case discussed with the on-call gastroenterologist, Dr. Ardis Hughs of [place], as further described above Admission status: Inpatient; PCU  COVID-19 Vaccination status: Has received 2 doses of the [place] COVID-19 vaccine.    Of note, this patient was added by me to the following Admit List/Treatment Team:  mcadmits  PLEASE NOTE THAT DRAGON DICTATION SOFTWARE WAS USED IN THE CONSTRUCTION OF THIS NOTE.   Rhetta Mura DO [facility] Pager [phone] From [place]  11/24/2020, 7:57 PM

## 2020-11-24 NOTE — ED Provider Notes (Addendum)
[facility]ERGENCY DEPARTMENT Provider Note   CSN: [id number] Arrival date & time: 11/24/20  1502     History Chief Complaint  Patient presents with  . Weakness    Jacob [name] is a 70 y.o. male.  HPI   70 year old male with past medical history of HIV, ESRD on HD MWF, CAD status post CABG and stent, CHF, DM, alcohol abuse, HTN, HLD, GI bleeds requiring transfusion secondary to gastric AVMs presents the emergency department with weakness and black stool.  Patient is drowsy on exam, very poor historian.  He reports that he took a dose of his tramadol just prior to arrival.  He does arouse to name and has oriented responses just falls asleep easily.  Denies any fever or abdominal pain.  No vomiting.  Denies any recent alcohol use.  Past Medical History:  Diagnosis Date  . Acute respiratory failure ([place]) 03/01/2018  . Arthritis    "all over; mostly knees and back" (02/28/2018)  . Chronic lower back pain    stenosis  . Community acquired pneumonia 09/06/2013  . COPD (chronic obstructive pulmonary disease) ([place])   . Coronary atherosclerosis of native coronary artery    a. 02/2003 s/p CABG x 2 (VG->RI, VG->RPDA; b. 11/2019 PCI: LM nl, LAD 90d, D3 50, RI 100, LCX 100p, OM3 100 - fills via L->L collats from D2/dLAD, RCA 100p, VG->RPDA ok, VG->RI 95 (3.5x48 Synergy XD DES).  . Drug abuse ([place])    hx; tested for cocaine as recently as 2/08. says he is not using drugs now - avoided defib. for this reason   . ESRD (end stage renal disease) ([place])    Hemo M-W-F- Richarda Blade  . Fall at home 10/2020  . GERD (gastroesophageal reflux disease)    takes OTC meds as needed  . GI bleeding    a. 11/2019 EGD: angiodysplastic lesions w/ bleeding s/p argon plasma/clipping/epi inj.  . Glaucoma    uses eye drops daily  . Hepatitis B 1968   "tx'd w/isolation; caught it from toilet stools in gym"  . HFrEF (heart failure with reduced ejection fraction) ([place])    a. 01/2019 Echo: EF 40-45%,  diffuse HK, mild basal septal hypertrophy.  . History of blood transfusion 03/01/2019  . History of colon polyps    benign  . History of gout    takes Allopurinol daily as well as Colchicine-if needed (02/28/2018)  . History of kidney stones   . HTN (hypertension)    takes Coreg,Imdur.and Apresoline daily  . Human immunodeficiency virus (HIV) disease ([place]) dx'd 1995   takes Genvoya daily  . Hyperlipidemia    takes Atorvastatin daily  . Ischemic cardiomyopathy    a. 01/2019 Echo: EF 40-45%, diffuse HK, mild basal septal hypertrophy. Diast dysfxn. Nl RV size/fxn. Sev dil LA. Triv MR/TR/PR.  [name] Muscle spasm    takes Zanaflex as needed  . Myocardial infarction ([place]) ~ 2004/2005  . Nocturia   . Peripheral neuropathy    takes gabapentin daily  . Pneumonia    "at least twice" (02/28/2018)  . Shortness of breath dyspnea    rarely but if notices it then with exertion  . Syphilis, unspecified   . Type II diabetes mellitus ([place]) 2004   Lantus daily.Average fasting blood sugar 125-199  . Wears glasses   . Wears partial dentures     Patient Active Problem List   Diagnosis Date Noted  . Subdural hematoma ([place]) 11/06/2020  . Fall at home, initial encounter  11/05/2020  . Nicotine dependence, cigarettes, uncomplicated 78/24/2353  . Alcohol abuse 11/05/2020  . GERD without esophagitis 11/05/2020  . Unspecified trochanteric fracture of left femur, initial encounter for closed fracture ([place]) 11/05/2020  . Traumatic subdural hematoma, initial encounter ([place]) 11/05/2020  . Fracture of metatarsal of right foot, closed 11/05/2020  . Nondisplaced fracture of greater trochanter of left femur, initial encounter for closed fracture ([place])   . Uremia   . Acute GI bleeding 10/17/2020  . AVM (arteriovenous malformation) of small bowel, acquired with hemorrhage   . Fluid overload 10/16/2020  . Cardiomyopathy ([place]) 12/21/2019  . Iron deficiency anemia   . Acute blood loss anemia   . Acute on chronic anemia    . AVM (arteriovenous malformation) of duodenum, acquired   . Abnormal nuclear stress test 12/19/2019  . ESRD on hemodialysis ([place])   . Hand pain, left   . Cocaine abuse ([place])   . Hypertensive emergency   . CHF exacerbation ([place]) 07/21/2019  . Diabetic foot ulcer ([place]) 02/11/2019  . Gastric AVM   . Melena 02/07/2019  . CKD stage 4 due to type 2 diabetes mellitus ([place]) 05/30/2018  . Hyperlipidemia associated with type 2 diabetes mellitus ([place]) 05/30/2018  . Right foot ulcer ([place]) 02/28/2018  . Chronic obstructive pulmonary disease ([place]) 02/28/2018  . Anemia of chronic disease 11/20/2016  . CKD (chronic kidney disease) stage 3, GFR 30-59 ml/min (HCC) 11/20/2016  . CHF (congestive heart failure) ([place]) 11/10/2016  . History of lumbar laminectomy for spinal cord decompression 02/29/2016  . Type 2 diabetes mellitus with diabetic polyneuropathy, with long-term current use of insulin ([place]) 06/17/2015  . HTN (hypertension) 04/26/2015  . DM (diabetes mellitus) ([place]) 04/26/2015  . Ischemic cardiomyopathy 05/12/2014  . Ulcer of lower extremity ([place]) 09/06/2013  . Hyperkalemia 09/06/2013  . Chest pain 09/06/2013  . Type II or unspecified type diabetes mellitus with unspecified complication, not stated as uncontrolled 10/22/2012  . Bunion of left foot 11/25/2011  . Bunion, right foot 11/25/2011  . Polysubstance abuse ([place]) 07/28/2011  . Hip fracture, left ([place]) 04/04/2011  . Closed fracture of neck of femur ([place]) 04/04/2011  . Insomnia 02/18/2011  . Coronary artery disease involving native heart without angina pectoris 04/20/2009  . Chronic combined systolic (congestive) and diastolic (congestive) heart failure ([place]) 04/20/2009  . Mixed hyperlipidemia 11/20/2006  . Gout 11/20/2006  . TOBACCO ABUSE 11/20/2006  . Essential hypertension 11/20/2006  . Human immunodeficiency virus (HIV) disease ([place]) 09/02/2006    Past Surgical History:  Procedure Laterality Date  . AV FISTULA PLACEMENT  Left 08/02/2018   Procedure: ARTERIOVENOUS (AV) FISTULA CREATION  left arm radiocephlic;  Surgeon: Marty Heck, MD;  Location: [facility];  Service: Vascular;  Laterality: Left;  . AV FISTULA PLACEMENT Left 08/01/2019   Procedure: LEFT BRACHIOCEPHALIC ARTERIOVENOUS (AV) FISTULA CREATION;  Surgeon: [name], MD;  Location: [facility];  Service: Vascular;  Laterality: Left;  . BASCILIC VEIN TRANSPOSITION Left 10/03/2019   Procedure: BASILIC VEIN TRANSPOSITION LEFT SECOND STAGE;  Surgeon: [name], MD;  Location: [facility];  Service: Vascular;  Laterality: Left;  . CARDIAC CATHETERIZATION  10/2002; 12/19/2004   [name] 03/08/2011  . COLONOSCOPY  2013   Dr.John Henrene Pastor   . CORONARY ARTERY BYPASS GRAFT  02/24/2003   CABG X2/notes 03/08/2011  . CORONARY STENT INTERVENTION N/A 12/19/2019   Procedure: CORONARY STENT INTERVENTION;  Surgeon: Jettie Booze, MD;  Location: [facility] LAB;  Service: Cardiovascular;  Laterality: N/A;  .  ESOPHAGOGASTRODUODENOSCOPY (EGD) WITH PROPOFOL N/A 02/08/2019   Procedure: ESOPHAGOGASTRODUODENOSCOPY (EGD) WITH PROPOFOL;  Surgeon: Milus Banister, MD;  Location: [facility];  Service: Gastroenterology;  Laterality: N/A;  . ESOPHAGOGASTRODUODENOSCOPY (EGD) WITH PROPOFOL N/A 12/22/2019   Procedure: ESOPHAGOGASTRODUODENOSCOPY (EGD) WITH PROPOFOL;  Surgeon: Lavena Bullion, DO;  Location: [facility];  Service: Gastroenterology;  Laterality: N/A;  . ESOPHAGOGASTRODUODENOSCOPY (EGD) WITH PROPOFOL N/A 10/19/2020   Procedure: ESOPHAGOGASTRODUODENOSCOPY (EGD) WITH PROPOFOL;  Surgeon: Jackquline Denmark, MD;  Location: [facility] ENDOSCOPY;  Service: Endoscopy;  Laterality: N/A;  . HEMOSTASIS CLIP PLACEMENT  12/22/2019   Procedure: HEMOSTASIS CLIP PLACEMENT;  Surgeon: Lavena Bullion, DO;  Location: [facility];  Service: Gastroenterology;;  . HOT HEMOSTASIS N/A 02/08/2019   Procedure: HOT HEMOSTASIS (ARGON PLASMA COAGULATION/BICAP);  Surgeon: Milus Banister, MD;  Location: [facility]  ENDOSCOPY;  Service: Gastroenterology;  Laterality: N/A;  . HOT HEMOSTASIS N/A 12/22/2019   Procedure: HOT HEMOSTASIS (ARGON PLASMA COAGULATION/BICAP);  Surgeon: Lavena Bullion, DO;  Location[facility] ENDOSCOPY;  Service: Gastroenterology;  Laterality: N/A;  . HOT HEMOSTASIS N/A 10/19/2020   Procedure: HOT HEMOSTASIS (ARGON PLASMA COAGULATION/BICAP);  Surgeon: Jackquline Denmark, MD;  Location: [facility] ENDOSCOPY;  Service: Endoscopy;  Laterality: N/A;  . INTERTROCHANTERIC HIP FRACTURE SURGERY Left 11/2006   [name] 03/08/2011  . INTRAVASCULAR ULTRASOUND/IVUS N/A 12/19/2019   Procedure: Intravascular Ultrasound/IVUS;  Surgeon: Jettie Booze, MD;  Location: [facility] CV LAB;  Service: Cardiovascular;  Laterality: N/A;  . IR FLUORO GUIDE CV LINE RIGHT  07/24/2019  . IR FLUORO GUIDE CV LINE RIGHT  07/30/2019  . IR US GUIDE VASC ACCESS RIGHT  07/24/2019  . IR US GUIDE VASC ACCESS RIGHT  07/30/2019  . LAPAROSCOPIC CHOLECYSTECTOMY  05/2006  . LIGATION OF COMPETING BRANCHES OF ARTERIOVENOUS FISTULA Left 11/05/2018   Procedure: LIGATION OF COMPETING BRANCHES OF ARTERIOVENOUS FISTULA  LEFT  ARM;  Surgeon: Marty Heck, MD;  Location: [facility];  Service: Vascular;  Laterality: Left;  . LUMBAR LAMINECTOMY/DECOMPRESSION MICRODISCECTOMY N/A 02/29/2016   Procedure: Left L4-5 Lateral Recess Decompression, Removal Extradural Intraspinal Facet Cyst;  Surgeon: Marybelle Killings, MD;  Location: [facility];  Service: Orthopedics;  Laterality: N/A;  . MULTIPLE TOOTH EXTRACTIONS    . ORIF MANDIBULAR FRACTURE Left 08/13/2004   ORIF of left body fracture mandible with KLS Martin 2.3-mm six hole/notes 03/08/2011  . RIGHT/LEFT HEART CATH AND CORONARY/GRAFT ANGIOGRAPHY N/A 12/19/2019   Procedure: RIGHT/LEFT HEART CATH AND CORONARY/GRAFT ANGIOGRAPHY;  Surgeon: Jettie Booze, MD;  Location: [facility] CV LAB;  Service: Cardiovascular;  Laterality: N/A;  . SCLEROTHERAPY  12/22/2019   Procedure: SCLEROTHERAPY;  Surgeon: Lavena Bullion, DO;  Location: [facility] ENDOSCOPY;  Service: Gastroenterology;;       Family History  Problem Relation Age of Onset  . Heart failure Father   . Hypertension Father   . Diabetes Brother   . Heart attack Brother   . Alzheimer's disease Mother   . Stroke Sister   . Diabetes Sister   . Alzheimer's disease Sister   . Hypertension Brother   . Diabetes Brother   . Drug abuse Brother   . Colon cancer Neg Hx     Social History   Tobacco Use  . Smoking status: Current Every Day Smoker    Packs/day: 0.50    Years: 43.00    Pack years: 21.50    Types: Cigarettes  . Smokeless tobacco: Never Used  Vaping Use  . Vaping Use: Never used  Substance Use Topics  . Alcohol  use: Yes    Alcohol/week: 12.0 standard drinks    Types: 12 Standard drinks or equivalent per week    Comment: occ    Home Medications Prior to Admission medications   Medication Sig Start Date End Date Taking? Authorizing Provider  acetaminophen (TYLENOL) 650 MG CR tablet Take 650 mg by mouth 3 (three) times daily.    [provider]  allopurinol (ZYLOPRIM) 100 MG tablet TAKE 1 TABLET(100 MG) BY MOUTH DAILY Patient taking differently: Take 100 mg by mouth daily. 07/23/20   [name], NP  ANORO ELLIPTA 62.5-25 MCG/INH AEPB INHALE 1 PUFF BY MOUTH EVERY DAY Patient taking differently: Inhale 1 puff into the lungs daily. 12/11/19   [name], Dinah C, NP  B-D UF III MINI PEN NEEDLES 31G X 5 MM MISC USE FOUR TIMES DAILY 03/02/20   [name], NP  bictegravir-emtricitabine-tenofovir AF (BIKTARVY) 50-200-25 MG TABS tablet Take 1 tablet by mouth daily. 06/30/20   [name], MD  carvedilol (COREG) 3.125 MG tablet Take 1 tablet (3.125 mg total) by mouth 2 (two) times daily. 02/13/20   [name], MD  Cinacalcet HCl (SENSIPAR PO) Take by mouth.  08/14/20 08/13/21  [provider]  colchicine 0.6 MG tablet Take 0.6-1.2 mg by mouth daily as needed (gout flare-up).    [provider]   diclofenac Sodium (VOLTAREN) 1 % GEL APPLY 2 GRAMS TO THE AFFECTED AREA THREE TIMES DAILY AS NEEDED FOR PAIN 11/16/20   [name], NP  folic acid (FOLVITE) 1 MG tablet Take 1 tablet (1 mg total) by mouth daily. 11/08/20   Aline August, MD  gabapentin (NEURONTIN) 300 MG capsule Take 1 capsule (300 mg total) by mouth 2 (two) times daily. 09/22/20   [name], Dinah C, NP  insulin lispro (HUMALOG KWIKPEN) 100 UNIT/ML KwikPen Inject 0.47ml (20 Units total) into the skin 3 (three) times daily as needed for high blood sugar (above 150) Patient taking differently: Inject 20 Units into the skin 3 (three) times daily as needed (blood sugar >150 mg/dl). 03/12/20   [name], NP  iron sucrose in sodium chloride 0.9 % 100 mL Iron Sucrose (Venofer) 07/08/20 07/07/21  [provider]  isosorbide mononitrate (IMDUR) 30 MG 24 hr tablet TAKE 1 TABLET BY MOUTH EVERY DAY Patient taking differently: Take 30 mg by mouth daily. TAKE 1 TABLET BY MOUTH EVERY DAY 08/10/20   [name], NP  Lancets [facility] DELICA PLUS [id number]) MISC Inject 1 Device as directed 3 (three) times daily. Dx: E11.40 09/18/19   [name], NP  latanoprost (XALATAN) 0.005 % ophthalmic solution Place 1 drop into both eyes at bedtime.    [provider]  Methoxy PEG-Epoetin Beta (MIRCERA IJ) Mircera 07/06/20   [provider]  multivitamin (RENA-VIT) TABS tablet Take 1 tablet by mouth daily.    [provider]  nitroGLYCERIN (NITROSTAT) 0.3 MG SL tablet ONE TABLET UNDER TONGUE AS NEEDED FOR CHEST PAIN Patient taking differently: Place 0.3 mg under the tongue every 5 (five) minutes as needed for chest pain (max 3 doses). 05/01/19   [name], MD  Nutritional Supplements (VITAMIN D BOOSTER PO) Take 1 each by mouth 2 (two) times daily. 08/12/20 08/11/21  [provider]  ONETOUCH ULTRA test strip CHECK BLOOD SUGAR 3 TIMES  DAILY 05/27/20   [name], NP   oxyCODONE-acetaminophen (PERCOCET/ROXICET) 5-325 MG tablet Take 1 tablet by mouth every 8 (eight) hours as needed for severe pain.  11/12/20   Marybelle Killings, MD  pantoprazole (PROTONIX) 40 MG tablet TAKE 1 TABLET(40 MG) BY MOUTH TWICE DAILY Patient taking differently: Take 40 mg by mouth 2 (two) times daily. 10/19/20   Fayrene Helper, MD  polyethylene glycol (MIRALAX / GLYCOLAX) 17 g packet Take 17 g by mouth daily as needed for mild constipation. 11/07/20   Aline August, MD  sevelamer carbonate (RENVELA) 800 MG tablet Take 800 mg by mouth 3 (three) times daily with meals.    [provider]  thiamine 100 MG tablet Take 1 tablet (100 mg total) by mouth daily. 11/08/20   Aline August, MD  ticagrelor (BRILINTA) 90 MG TABS tablet Take 90 mg by mouth 2 (two) times daily.    [provider]  timolol (TIMOPTIC) 0.5 % ophthalmic solution Place 1 drop into both eyes daily. 04/04/19   [provider]  traMADol (ULTRAM) 50 MG tablet Take 1 tablet (50 mg total) by mouth 2 (two) times daily as needed. 11/20/20   Marybelle Killings, MD    Allergies    Augmentin [amoxicillin-pot clavulanate] and Amphetamines  Review of Systems   Review of Systems  Constitutional: Negative for fever.  Respiratory: Positive for shortness of breath.   Cardiovascular: Negative for chest pain.  Gastrointestinal: Negative for abdominal pain, diarrhea and vomiting.       + Black stool  Genitourinary:       + Still makes urine  Skin: Negative for rash.  Neurological: Negative for headaches.    Physical Exam Updated Vital Signs BP (!) 108/55   Pulse 80   Temp 98.2 F (36.8 C) (Oral)   Resp 14   SpO2 96%   Physical Exam Vitals and nursing note reviewed. Exam conducted with a chaperone present.  Constitutional:      Appearance: Normal appearance.     Comments: Drowsy, arouses to name, has oriented questions but falls asleep very easily  HENT:     Head: Normocephalic.     Mouth/Throat:      Mouth: Mucous membranes are dry.  Cardiovascular:     Rate and Rhythm: Normal rate.  Pulmonary:     Effort: Pulmonary effort is normal. No respiratory distress.  Abdominal:     General: There is no distension.     Palpations: Abdomen is soft.     Tenderness: There is no abdominal tenderness.  Genitourinary:    Rectum: Guaiac result positive.     Comments: Black stool at rectum Skin:    General: Skin is warm.  Neurological:     Mental Status: He is oriented to person, place, and time.     Comments: Very drowsy, appears intoxicated or under the influence, does arouse and have oriented answers but falls asleep quickly  Psychiatric:        Mood and Affect: Mood normal.     ED Results / Procedures / Treatments   Labs (all labs ordered are listed, but only abnormal results are displayed) Labs Reviewed  CBC WITH DIFFERENTIAL/PLATELET - Abnormal; Notable for the following components:      Result Value   RBC 1.50 (*)    Hemoglobin 4.7 (*)    HCT 16.1 (*)    MCV 107.3 (*)    MCHC 29.2 (*)    RDW 18.1 (*)    All other components within normal limits  BASIC METABOLIC PANEL - Abnormal; Notable for the following components:   Glucose, Bld 146 (*)    BUN 89 (*)  Creatinine, Ser 6.13 (*)    Calcium 7.6 (*)    GFR, Estimated 9 (*)    Anion gap 18 (*)    All other components within normal limits  I-STAT VENOUS BLOOD GAS, ED - Abnormal; Notable for the following components:   pH, Ven 7.551 (*)    pCO2, Ven 30.1 (*)    pO2, Ven 65.0 (*)    Acid-Base Excess 4.0 (*)    Calcium, Ion 0.88 (*)    HCT 15.0 (*)    Hemoglobin 5.1 (*)    All other components within normal limits  POC OCCULT BLOOD, ED - Abnormal; Notable for the following components:   Fecal Occult Bld POSITIVE (*)    All other components within normal limits  SARS CORONAVIRUS 2 BY RT PCR (HOSPITAL ORDER, [facility])  PROTIME-INR  TYPE AND SCREEN  PREPARE RBC (CROSSMATCH)  TROPONIN I (HIGH  SENSITIVITY)    EKG EKG Interpretation  Date/Time:  Tuesday November 24 2020 15:03:28 EST Ventricular Rate:  86 PR Interval:    QRS Duration: 137 QT Interval:  520 QTC Calculation: 593 R Axis:   -26 Text Interpretation: Sinus rhythm Multiform ventricular premature complexes Right bundle branch block Left ventricular hypertrophy No significant change since last tracing Confirmed by Deno Etienne [id number]) on 11/24/2020 3:05:52 PM   Radiology No results found.  Procedures .Critical Care Performed by: Lorelle Gibbs, DO Authorized by: Lorelle Gibbs, DO   Critical care provider statement:    Critical care time (minutes):  45   Critical care was time spent personally by me on the following activities:  Discussions with consultants, evaluation of patient's response to treatment, examination of patient, ordering and performing treatments and interventions, ordering and review of laboratory studies, ordering and review of radiographic studies, pulse oximetry, re-evaluation of patient's condition, obtaining history from patient or surrogate and review of old charts   I assumed direction of critical care for this patient from another provider in my specialty: no       Medications Ordered in ED Medications  0.9 %  sodium chloride infusion (has no administration in time range)    ED Course  I have reviewed the triage vital signs and the nursing notes.  Pertinent labs & imaging results that were available during my care of the patient were reviewed by me and considered in my medical decision making (see chart for details).    MDM Rules/Calculators/A&P                          70 year old male presents the emergency department black stool and weakness.  Vitals are stable on arrival, EKG shows no significant change from previous.  No active chest pain.  Blood work shows a hemoglobin of 4.7, baseline appears to be 9-10.  Kidney function is at baseline.  Patient is Covid positive.  Type  and screen ordered, blood transfusion ordered.  He is fecal occult positive with black stool.  Dr. Ardis Hughs from gastroenterology notified.  They recommend twice daily PPI and transfusion and will consult. No plan for emergent scope, on Brilinta.  Dr. Velia Meyer admitting the patient.  Patient stable at time of admission.  Patients evaluation and results requires admission for further treatment and care. Patient agrees with admission plan, offers no new complaints and is stable/unchanged at time of admit.  Final Clinical Impression(s) / ED Diagnoses Final diagnoses:  None    Rx / DC Orders  ED Discharge Orders    None       Lorelle Gibbs, DO 11/24/20 1929    Lorelle Gibbs, DO 11/24/20 2154

## 2020-11-25 DIAGNOSIS — I5042 Chronic combined systolic (congestive) and diastolic (congestive) heart failure: Secondary | ICD-10-CM

## 2020-11-25 DIAGNOSIS — Z7902 Long term (current) use of antithrombotics/antiplatelets: Secondary | ICD-10-CM

## 2020-11-25 DIAGNOSIS — K921 Melena: Principal | ICD-10-CM

## 2020-11-25 DIAGNOSIS — B20 Human immunodeficiency virus [HIV] disease: Secondary | ICD-10-CM

## 2020-11-25 DIAGNOSIS — K922 Gastrointestinal hemorrhage, unspecified: Secondary | ICD-10-CM

## 2020-11-25 DIAGNOSIS — R195 Other fecal abnormalities: Secondary | ICD-10-CM

## 2020-11-25 DIAGNOSIS — N186 End stage renal disease: Secondary | ICD-10-CM

## 2020-11-25 DIAGNOSIS — D649 Anemia, unspecified: Secondary | ICD-10-CM

## 2020-11-25 DIAGNOSIS — J449 Chronic obstructive pulmonary disease, unspecified: Secondary | ICD-10-CM

## 2020-11-25 DIAGNOSIS — Z992 Dependence on renal dialysis: Secondary | ICD-10-CM

## 2020-11-25 DIAGNOSIS — K31819 Angiodysplasia of stomach and duodenum without bleeding: Secondary | ICD-10-CM

## 2020-11-25 LAB — GLUCOSE, CAPILLARY
Glucose-Capillary: 101 mg/dL — ABNORMAL HIGH (ref 70–99)
Glucose-Capillary: 108 mg/dL — ABNORMAL HIGH (ref 70–99)
Glucose-Capillary: 111 mg/dL — ABNORMAL HIGH (ref 70–99)
Glucose-Capillary: 121 mg/dL — ABNORMAL HIGH (ref 70–99)
Glucose-Capillary: 135 mg/dL — ABNORMAL HIGH (ref 70–99)
Glucose-Capillary: 97 mg/dL (ref 70–99)

## 2020-11-25 LAB — PHOSPHORUS: Phosphorus: 5.3 mg/dL — ABNORMAL HIGH (ref 2.5–4.6)

## 2020-11-25 LAB — CBC
HCT: 22.2 % — ABNORMAL LOW (ref 39.0–52.0)
Hemoglobin: 7 g/dL — ABNORMAL LOW (ref 13.0–17.0)
MCH: 31.7 pg (ref 26.0–34.0)
MCHC: 31.5 g/dL (ref 30.0–36.0)
MCV: 100.5 fL — ABNORMAL HIGH (ref 80.0–100.0)
Platelets: 171 10*3/uL (ref 150–400)
RBC: 2.21 MIL/uL — ABNORMAL LOW (ref 4.22–5.81)
RDW: 18.5 % — ABNORMAL HIGH (ref 11.5–15.5)
WBC: 6.8 10*3/uL (ref 4.0–10.5)
nRBC: 0 % (ref 0.0–0.2)

## 2020-11-25 LAB — RETICULOCYTES
Immature Retic Fract: 36.4 % — ABNORMAL HIGH (ref 2.3–15.9)
RBC.: 2.21 MIL/uL — ABNORMAL LOW (ref 4.22–5.81)
Retic Count, Absolute: 128.6 10*3/uL (ref 19.0–186.0)
Retic Ct Pct: 5.8 % — ABNORMAL HIGH (ref 0.4–3.1)

## 2020-11-25 LAB — PREPARE RBC (CROSSMATCH)

## 2020-11-25 LAB — COMPREHENSIVE METABOLIC PANEL
ALT: 13 U/L (ref 0–44)
AST: 22 U/L (ref 15–41)
Albumin: 2.4 g/dL — ABNORMAL LOW (ref 3.5–5.0)
Alkaline Phosphatase: 73 U/L (ref 38–126)
Anion gap: 14 (ref 5–15)
BUN: 105 mg/dL — ABNORMAL HIGH (ref 8–23)
CO2: 28 mmol/L (ref 22–32)
Calcium: 7.7 mg/dL — ABNORMAL LOW (ref 8.9–10.3)
Chloride: 100 mmol/L (ref 98–111)
Creatinine, Ser: 6.84 mg/dL — ABNORMAL HIGH (ref 0.61–1.24)
GFR, Estimated: 8 mL/min — ABNORMAL LOW (ref >60)
Glucose, Bld: 127 mg/dL — ABNORMAL HIGH (ref 70–99)
Potassium: 4.1 mmol/L (ref 3.5–5.1)
Sodium: 142 mmol/L (ref 135–145)
Total Bilirubin: 1.1 mg/dL (ref 0.3–1.2)
Total Protein: 6.1 g/dL — ABNORMAL LOW (ref 6.5–8.1)

## 2020-11-25 LAB — FIBRINOGEN: Fibrinogen: 461 mg/dL (ref 210–475)

## 2020-11-25 LAB — FOLATE: Folate: 49.5 ng/mL (ref >5.9)

## 2020-11-25 LAB — PROCALCITONIN: Procalcitonin: 0.78 ng/mL

## 2020-11-25 LAB — C-REACTIVE PROTEIN: CRP: 5 mg/dL — ABNORMAL HIGH (ref <1.0)

## 2020-11-25 LAB — FERRITIN: Ferritin: 299 ng/mL (ref 24–336)

## 2020-11-25 LAB — PROTIME-INR
INR: 1.2 (ref 0.8–1.2)
Prothrombin Time: 14.7 seconds (ref 11.4–15.2)

## 2020-11-25 LAB — D-DIMER, QUANTITATIVE: D-Dimer, Quant: 0.86 ug/mL-FEU — ABNORMAL HIGH (ref 0.00–0.50)

## 2020-11-25 LAB — IRON AND TIBC
Iron: 45 ug/dL (ref 45–182)
Saturation Ratios: 14 % — ABNORMAL LOW (ref 17.9–39.5)
TIBC: 314 ug/dL (ref 250–450)
UIBC: 269 ug/dL

## 2020-11-25 LAB — LACTATE DEHYDROGENASE: LDH: 172 U/L (ref 98–192)

## 2020-11-25 LAB — MAGNESIUM: Magnesium: 2.1 mg/dL (ref 1.7–2.4)

## 2020-11-25 MED ORDER — CHLORHEXIDINE GLUCONATE CLOTH 2 % EX PADS: 6.0000 | MEDICATED_PAD | Freq: Every day | CUTANEOUS | Status: DC

## 2020-11-25 MED ORDER — SODIUM CHLORIDE 0.9% IV SOLUTION: Freq: Once | INTRAVENOUS | Status: AC

## 2020-11-25 NOTE — Plan of Care (Signed)
  Problem: Education: Goal: Knowledge of risk factors and measures for prevention of condition will improve Outcome: Progressing   Problem: Coping: Goal: Psychosocial and spiritual needs will be supported Outcome: Progressing   Problem: Respiratory: Goal: Will maintain a patent airway Outcome: Progressing Goal: Complications related to the disease process, condition or treatment will be avoided or minimized Outcome: Progressing   

## 2020-11-25 NOTE — Progress Notes (Addendum)
Patient has decided to leave AMA. RN and CN informed patient of risks of leaving AMA. Patient verbalized understanding and is still insistent on going home. RN provided AMA paperwork and witnessed signature. With patient's permission, RN spoke to patient's sister and RN advised patient's sister of risks of patient not treating GI bleed as well and she verbalized understanding. AMA form filed in patient's chart - CN has paged MD.

## 2020-11-25 NOTE — Progress Notes (Signed)
PROGRESS NOTE  Jacob [name]:[id number] DOB: 1951/02/12 DOA: 11/24/2020 PCP: [name], NP   LOS: 1 day   Brief Narrative / Interim history: 70 year old male with history of CAD with history of CABG, also drug-eluting stent in February 2021, chronic combined CHF with EF of 40-45% in December 2021, ESRD on HD MWF, recurrent upper GI bleeds requiring blood transfusion, smoker, drinks EtOH but has not had anything in the last month, comes into the hospital with black stools.  This has been going on for the last couple of days.  His hemoglobin was found to be in the 4 range and was given 2 units of red blood cells.  Incidentally he was also Covid positive but has no symptoms  Subjective / 24h Interval events: He is upset about his recurrent GI bleed, does not know why he is getting these  Assessment & Plan: Principal Problem Upper GI bleed with acute blood loss anemia, underlying chronic anemia in the setting of renal disease-patient was given 2 units of packed red blood cells, hemoglobin improved to 7.0.  Do an additional unit.  He has a history of gastric AVMs with EGD in February 2021 showing angioplastic lesions s/p APC, clipping and epinephrine injection.  GI consulted, appreciate input.  Appears hemodynamically stable.  Continue PPI  Active Problems COVID-19 positive, incidental-no symptoms.  ESRD-nephrology consulted  Chronic alcohol use-1-2 beers per day, told to admit her that last EtOH was yesterday, told me that he has not drank this year.  Monitor for EtOH withdrawals  DM2 -On sliding scale  HIV -continue home medications  COPD, ongoing tobacco use-continue inhalers, no wheezing   Scheduled Meds: . bictegravir-emtricitabine-tenofovir AF  1 tablet Oral Daily  . insulin aspart  0-6 Units Subcutaneous Q6H  . latanoprost  1 drop Both Eyes QHS  . pantoprazole (PROTONIX) IV  40 mg Intravenous Q12H  . timolol  1 drop Both Eyes Daily  . umeclidinium-vilanterol  1  puff Inhalation Daily   Continuous Infusions: . sodium chloride     PRN Meds:.albuterol, LORazepam  Diet Orders (From admission, onward)    Start     Ordered   11/24/20 1942  Diet NPO time specified  Diet effective now        11/24/20 1942          DVT prophylaxis: SCDs Start: 11/24/20 1942     Code Status: Full Code  Family Communication: no family at bedside   Status is: Inpatient  Remains inpatient appropriate because:Inpatient level of care appropriate due to severity of illness   Dispo: The patient is from: Home              Anticipated d/c is to: Home              Anticipated d/c date is: 3 days              Patient currently is not medically stable to d/c.   Difficult to place patient No  Level of care: Progressive  Consultants:  GI Nephrology   Procedures:  None   Microbiology  None   Antimicrobials: None     Objective: Vitals:   11/25/20 0125 11/25/20 0311 11/25/20 0759 11/25/20 0842  BP: 114/61 (!) 109/56 116/69 123/63  Pulse: 74 73 79 74  Resp: 16 18 18 20   Temp: (!) 97.5 F (36.4 C) 97.6 F (36.4 C) 97.6 F (36.4 C) (!) 97.5 F (36.4 C)  TempSrc: Oral Axillary Oral Oral  SpO2:  100% 100% 100% 100%    Intake/Output Summary (Last 24 hours) at 11/25/2020 1044 Last data filed at 11/25/2020 0445 Gross per 24 hour  Intake 1771.33 ml  Output --  Net 1771.33 ml   There were no vitals filed for this visit.  Examination:  Constitutional: NAD Eyes: no scleral icterus ENMT: Mucous membranes are moist.  Neck: normal, supple Respiratory: clear to auscultation bilaterally, no wheezing, no crackles. Normal respiratory effort. Cardiovascular: Regular rate and rhythm, no murmurs / rubs / gallops. No LE edema. Abdomen: non distended, no tenderness.  Musculoskeletal: no clubbing / cyanosis.  Skin: no rashes Neurologic: CN 2-12 grossly intact. Strength 5/5 in all 4.   Data Reviewed: I have independently reviewed following labs and imaging  studies   CBC: Recent Labs  Lab 11/24/20 1525 11/24/20 1606 11/25/20 0637  WBC 5.7  --  6.8  NEUTROABS 4.9  --   --   HGB 4.7* 5.1* 7.0*  HCT 16.1* 15.0* 22.2*  MCV 107.3*  --  100.5*  PLT 180  --  342   Basic Metabolic Panel: Recent Labs  Lab 11/24/20 1525 11/24/20 1606 11/25/20 0637  NA 141 142 142  K 3.7 3.6 4.1  CL 99  --  100  CO2 24  --  28  GLUCOSE 146*  --  127*  BUN 89*  --  105*  CREATININE 6.13*  --  6.84*  CALCIUM 7.6*  --  7.7*  MG  --   --  2.1  PHOS  --   --  5.3*   Liver Function Tests: Recent Labs  Lab 11/25/20 0637  AST 22  ALT 13  ALKPHOS 73  BILITOT 1.1  PROT 6.1*  ALBUMIN 2.4*   Coagulation Profile: Recent Labs  Lab 11/24/20 1525 11/25/20 0524  INR 1.2 1.2   HbA1C: No results for input(s): HGBA1C in the last 72 hours. CBG: Recent Labs  Lab 11/24/20 2306 11/25/20 0056 11/25/20 0545 11/25/20 0803  GLUCAP 96 101* 111* 108*    Recent Results (from the past 240 hour(s))  SARS Coronavirus 2 by RT PCR (hospital order, performed in [facility] hospital lab) Nasopharyngeal Nasopharyngeal Swab     Status: Abnormal   Collection Time: 11/24/20  4:39 PM   Specimen: Nasopharyngeal Swab  Result Value Ref Range Status   SARS Coronavirus 2 POSITIVE (A) NEGATIVE Final    Comment: RESULT CALLED TO, READ BACK BY AND VERIFIED WITH: RN MISSY E. [id number] FCP (NOTE) SARS-CoV-2 target nucleic acids are DETECTED  SARS-CoV-2 RNA is generally detectable in upper respiratory specimens  during the acute phase of infection.  Positive results are indicative  of the presence of the identified virus, but do not rule out bacterial infection or co-infection with other pathogens not detected by the test.  Clinical correlation with patient history and  other diagnostic information is necessary to determine patient infection status.  The expected result is negative.  Fact Sheet for Patients:   StrictlyIdeas.no   Fact Sheet  for Healthcare Providers:   BankingDealers.co.za    This test is not yet approved or cleared by the [place] FDA and  has been authorized for detection and/or diagnosis of SARS-CoV-2 by FDA under an Emergency Use Authorization (EUA).  This EUA will remain in effect (meaning this test can  be used) for the duration of  the COVID-19 declaration under Section 564(b)(1) of the Act, 21 U.S.C. section 360-bbb-3(b)(1), unless the authorization is terminated or revoked sooner.  Performed at  [facility], [place] [address], [place], [place] [address]      Radiology Studies: DG CHEST PORT 1 VIEW  Result Date: 11/24/2020 CLINICAL DATA:  Weakness, COVID-19 positivity EXAM: PORTABLE CHEST 1 VIEW COMPARISON:  11/05/2020 FINDINGS: Cardiac shadow is enlarged but stable. Postsurgical changes are again seen. Aortic calcifications are noted. Lungs are well aerated bilaterally. Mild patchy airspace opacities are noted consistent with multifocal pneumonia. No bony abnormality is noted. IMPRESSION: Patchy airspace opacity consistent with the known history of COVID-19 positivity. Electronically Signed   By: Inez Catalina M.D.   On: 11/24/2020 20:24   Marzetta Board, MD, PhD [facility]  Between 7 am - 7 pm I am available, please contact me via Amion or Securechat  Between 7 pm - 7 am I am not available, please contact night coverage MD/APP via Amion

## 2020-11-25 NOTE — Progress Notes (Signed)
IV removed and patient ambulated off unit.

## 2020-11-25 NOTE — Consult Note (Signed)
Referring Provider:  ? Primary Care Physician:  [name], NP Primary Gastroenterologist:  Dr. Henrene Pastor  Reason for Consultation:  Anemia  HPI: Jacob [name] is a 70 y.o. male with medical history significant for coronary artery disease status post CABG in 2004 followed by placement drug-eluting stent February 2021 on Brilinta, chronic systolic/diastolic heart failure with echocardiogram in December 2021 showing LVEF 40 to 45%, end-stage renal disease on hemodialysis on Monday, Wednesday, Friday schedule, upper GI bleeds requiring blood transfusion, anemia chronic kidney disease with baseline hemoglobin 7-9 gram range, type 2 diabetes mellitus complicated by polyperipheral neuropathy, HIV, GERD, COPD, daily alcohol consumption who is admitted to [facility] on 11/24/2020 after he presented with complaints of black stools x 2 days and feeling weak.  He did not offer much information as he was disgruntled about his situation and just wants to eat.  He denies abdominal pain.  Gastric AVMs s/p APC on EGD in 09/2020.  EGD 11/2019 showed four bleeding angiodysplastic lesions in the stomach. Two were actively bleeding. All were successfully treated with a combination of APC, hemostatic clips and Epinephrine injection as outlined above. There was no bleeding at the conclusion of the exam.  Last colonoscopy 09/2012 showed a single 2 mm polyps and diverticulosis.     Past Medical History:  Diagnosis Date  . Acute respiratory failure ([place]) 03/01/2018  . Arthritis    "all over; mostly knees and back" (02/28/2018)  . Chronic lower back pain    stenosis  . Community acquired pneumonia 09/06/2013  . COPD (chronic obstructive pulmonary disease) ([place])   . Coronary atherosclerosis of native coronary artery    a. 02/2003 s/p CABG x 2 (VG->RI, VG->RPDA; b. 11/2019 PCI: LM nl, LAD 90d, D3 50, RI 100, LCX 100p, OM3 100 - fills via L->L collats from D2/dLAD, RCA 100p, VG->RPDA ok, VG->RI 95 (3.5x48  Synergy XD DES).  . Drug abuse ([place])    hx; tested for cocaine as recently as 2/08. says he is not using drugs now - avoided defib. for this reason   . ESRD (end stage renal disease) ([place])    Hemo M-W-F- [facility]  . Fall at home 10/2020  . GERD (gastroesophageal reflux disease)    takes OTC meds as needed  . GI bleeding    a. 11/2019 EGD: angiodysplastic lesions w/ bleeding s/p argon plasma/clipping/epi inj.  . Glaucoma    uses eye drops daily  . Hepatitis B 1968   "tx'd w/isolation; caught it from toilet stools in gym"  . HFrEF (heart failure with reduced ejection fraction) ([place])    a. 01/2019 Echo: EF 40-45%, diffuse HK, mild basal septal hypertrophy.  . History of blood transfusion 03/01/2019  . History of colon polyps    benign  . History of gout    takes Allopurinol daily as well as Colchicine-if needed (02/28/2018)  . History of kidney stones   . HTN (hypertension)    takes Coreg,Imdur.and Apresoline daily  . Human immunodeficiency virus (HIV) disease ([place]) dx'd 1995   takes Genvoya daily  . Hyperlipidemia    takes Atorvastatin daily  . Ischemic cardiomyopathy    a. 01/2019 Echo: EF 40-45%, diffuse HK, mild basal septal hypertrophy. Diast dysfxn. Nl RV size/fxn. Sev dil LA. Triv MR/TR/PR.  [name] Muscle spasm    takes Zanaflex as needed  . Myocardial infarction ([place]) ~ 2004/2005  . Nocturia   . Peripheral neuropathy    takes gabapentin daily  . Pneumonia    "  at least twice" (02/28/2018)  . Shortness of breath dyspnea    rarely but if notices it then with exertion  . Syphilis, unspecified   . Type II diabetes mellitus ([place]) 2004   Lantus daily.Average fasting blood sugar 125-199  . Wears glasses   . Wears partial dentures     Past Surgical History:  Procedure Laterality Date  . AV FISTULA PLACEMENT Left 08/02/2018   Procedure: ARTERIOVENOUS (AV) FISTULA CREATION  left arm radiocephlic;  Surgeon: Marty Heck, MD;  Location: [place];  Service: Vascular;  Laterality:  Left;  . AV FISTULA PLACEMENT Left 08/01/2019   Procedure: LEFT BRACHIOCEPHALIC ARTERIOVENOUS (AV) FISTULA CREATION;  Surgeon: [name], MD;  Location: [place];  Service: Vascular;  Laterality: Left;  . BASCILIC VEIN TRANSPOSITION Left 10/03/2019   Procedure: BASILIC VEIN TRANSPOSITION LEFT SECOND STAGE;  Surgeon: [name], MD;  Location: [place];  Service: Vascular;  Laterality: Left;  . CARDIAC CATHETERIZATION  10/2002; 12/19/2004   [name] 03/08/2011  . COLONOSCOPY  2013   Dr.John Henrene Pastor   . CORONARY ARTERY BYPASS GRAFT  02/24/2003   CABG X2/notes 03/08/2011  . CORONARY STENT INTERVENTION N/A 12/19/2019   Procedure: CORONARY STENT INTERVENTION;  Surgeon: Jettie Booze, MD;  Location: [place] CV LAB;  Service: Cardiovascular;  Laterality: N/A;  . ESOPHAGOGASTRODUODENOSCOPY (EGD) WITH PROPOFOL N/A 02/08/2019   Procedure: ESOPHAGOGASTRODUODENOSCOPY (EGD) WITH PROPOFOL;  Surgeon: Milus Banister, MD;  Location: [place];  Service: Gastroenterology;  Laterality: N/A;  . ESOPHAGOGASTRODUODENOSCOPY (EGD) WITH PROPOFOL N/A 12/22/2019   Procedure: ESOPHAGOGASTRODUODENOSCOPY (EGD) WITH PROPOFOL;  Surgeon: Lavena Bullion, DO;  Location: [place];  Service: Gastroenterology;  Laterality: N/A;  . ESOPHAGOGASTRODUODENOSCOPY (EGD) WITH PROPOFOL N/A 10/19/2020   Procedure: ESOPHAGOGASTRODUODENOSCOPY (EGD) WITH PROPOFOL;  Surgeon: Jackquline Denmark, MD;  Location: [facility] ENDOSCOPY;  Service: Endoscopy;  Laterality: N/A;  . HEMOSTASIS CLIP PLACEMENT  12/22/2019   Procedure: HEMOSTASIS CLIP PLACEMENT;  Surgeon: Lavena Bullion, DO;  Location: [place];  Service: Gastroenterology;;  . HOT HEMOSTASIS N/A 02/08/2019   Procedure: HOT HEMOSTASIS (ARGON PLASMA COAGULATION/BICAP);  Surgeon: Milus Banister, MD;  Location: [facility] ENDOSCOPY;  Service: Gastroenterology;  Laterality: N/A;  . HOT HEMOSTASIS N/A 12/22/2019   Procedure: HOT HEMOSTASIS (ARGON PLASMA COAGULATION/BICAP);  Surgeon: Lavena Bullion, DO;  Location: [facility] ENDOSCOPY;  Service: Gastroenterology;  Laterality: N/A;  . HOT HEMOSTASIS N/A 10/19/2020   Procedure: HOT HEMOSTASIS (ARGON PLASMA COAGULATION/BICAP);  Surgeon: Jackquline Denmark, MD;  Location: [facility] ENDOSCOPY;  Service: Endoscopy;  Laterality: N/A;  . INTERTROCHANTERIC HIP FRACTURE SURGERY Left 11/2006   [name] 03/08/2011  . INTRAVASCULAR ULTRASOUND/IVUS N/A 12/19/2019   Procedure: Intravascular Ultrasound/IVUS;  Surgeon: Jettie Booze, MD;  Location: [place] CV LAB;  Service: Cardiovascular;  Laterality: N/A;  . IR FLUORO GUIDE CV LINE RIGHT  07/24/2019  . IR FLUORO GUIDE CV LINE RIGHT  07/30/2019  . IR US GUIDE VASC ACCESS RIGHT  07/24/2019  . IR US GUIDE VASC ACCESS RIGHT  07/30/2019  . LAPAROSCOPIC CHOLECYSTECTOMY  05/2006  . LIGATION OF COMPETING BRANCHES OF ARTERIOVENOUS FISTULA Left 11/05/2018   Procedure: LIGATION OF COMPETING BRANCHES OF ARTERIOVENOUS FISTULA  LEFT  ARM;  Surgeon: Marty Heck, MD;  Location: [place];  Service: Vascular;  Laterality: Left;  . LUMBAR LAMINECTOMY/DECOMPRESSION MICRODISCECTOMY N/A 02/29/2016   Procedure: Left L4-5 Lateral Recess Decompression, Removal Extradural Intraspinal Facet Cyst;  Surgeon: Marybelle Killings, MD;  Location: [place];  Service: Orthopedics;  Laterality: N/A;  .  MULTIPLE TOOTH EXTRACTIONS    . ORIF MANDIBULAR FRACTURE Left 08/13/2004   ORIF of left body fracture mandible with KLS Martin 2.3-mm six hole/notes 03/08/2011  . RIGHT/LEFT HEART CATH AND CORONARY/GRAFT ANGIOGRAPHY N/A 12/19/2019   Procedure: RIGHT/LEFT HEART CATH AND CORONARY/GRAFT ANGIOGRAPHY;  Surgeon: Jettie Booze, MD;  Location: [place] CV LAB;  Service: Cardiovascular;  Laterality: N/A;  . SCLEROTHERAPY  12/22/2019   Procedure: SCLEROTHERAPY;  Surgeon: Lavena Bullion, DO;  Location: [place] ENDOSCOPY;  Service: Gastroenterology;;    Prior to Admission medications   Medication Sig Start Date End Date Taking? Authorizing Provider   acetaminophen (TYLENOL) 650 MG CR tablet Take 650 mg by mouth 3 (three) times daily.    [provider]  allopurinol (ZYLOPRIM) 100 MG tablet TAKE 1 TABLET(100 MG) BY MOUTH DAILY Patient taking differently: Take 100 mg by mouth daily. 07/23/20   [name], NP  ANORO ELLIPTA 62.5-25 MCG/INH AEPB INHALE 1 PUFF BY MOUTH EVERY DAY Patient taking differently: Inhale 1 puff into the lungs daily. 12/11/19   [name], Dinah C, NP  B-D UF III MINI PEN NEEDLES 31G X 5 MM MISC USE FOUR TIMES DAILY 03/02/20   [name], NP  bictegravir-emtricitabine-tenofovir AF (BIKTARVY) 50-200-25 MG TABS tablet Take 1 tablet by mouth daily. 06/30/20   [name], MD  carvedilol (COREG) 3.125 MG tablet Take 1 tablet (3.125 mg total) by mouth 2 (two) times daily. 02/13/20   [name], MD  Cinacalcet HCl (SENSIPAR PO) Take by mouth.  08/14/20 08/13/21  [provider]  colchicine 0.6 MG tablet Take 0.6-1.2 mg by mouth daily as needed (gout flare-up).    [provider]  diclofenac Sodium (VOLTAREN) 1 % GEL APPLY 2 GRAMS TO THE AFFECTED AREA THREE TIMES DAILY AS NEEDED FOR PAIN 11/16/20   [name], NP  folic acid (FOLVITE) 1 MG tablet Take 1 tablet (1 mg total) by mouth daily. 11/08/20   Aline August, MD  gabapentin (NEURONTIN) 300 MG capsule Take 1 capsule (300 mg total) by mouth 2 (two) times daily. 09/22/20   [name], Dinah C, NP  insulin lispro (HUMALOG KWIKPEN) 100 UNIT/ML KwikPen Inject 0.63ml (20 Units total) into the skin 3 (three) times daily as needed for high blood sugar (above 150) Patient taking differently: Inject 20 Units into the skin 3 (three) times daily as needed (blood sugar >150 mg/dl). 03/12/20   [name], NP  iron sucrose in sodium chloride 0.9 % 100 mL Iron Sucrose (Venofer) 07/08/20 07/07/21  [provider]  isosorbide mononitrate (IMDUR) 30 MG 24 hr tablet TAKE 1 TABLET BY MOUTH EVERY DAY Patient taking differently: Take 30 mg  by mouth daily. TAKE 1 TABLET BY MOUTH EVERY DAY 08/10/20   [name], NP  Lancets [facility] DELICA PLUS [id number]) MISC Inject 1 Device as directed 3 (three) times daily. Dx: E11.40 09/18/19   [name], NP  latanoprost (XALATAN) 0.005 % ophthalmic solution Place 1 drop into both eyes at bedtime.    [provider]  Methoxy PEG-Epoetin Beta (MIRCERA IJ) Mircera 07/06/20   [provider]  multivitamin (RENA-VIT) TABS tablet Take 1 tablet by mouth daily.    [provider]  nitroGLYCERIN (NITROSTAT) 0.3 MG SL tablet ONE TABLET UNDER TONGUE AS NEEDED FOR CHEST PAIN Patient taking differently: Place 0.3 mg under the tongue every 5 (five) minutes as needed for chest pain (max 3 doses). 05/01/19   [name], MD  Nutritional  Supplements (VITAMIN D BOOSTER PO) Take 1 each by mouth 2 (two) times daily. 08/12/20 08/11/21  [provider]  ONETOUCH ULTRA test strip CHECK BLOOD SUGAR 3 TIMES  DAILY 05/27/20   [name], NP  oxyCODONE-acetaminophen (PERCOCET/ROXICET) 5-325 MG tablet Take 1 tablet by mouth every 8 (eight) hours as needed for severe pain. 11/12/20   Marybelle Killings, MD  pantoprazole (PROTONIX) 40 MG tablet TAKE 1 TABLET(40 MG) BY MOUTH TWICE DAILY Patient taking differently: Take 40 mg by mouth 2 (two) times daily. 10/19/20   Fayrene Helper, MD  polyethylene glycol (MIRALAX / GLYCOLAX) 17 g packet Take 17 g by mouth daily as needed for mild constipation. 11/07/20   Aline August, MD  sevelamer carbonate (RENVELA) 800 MG tablet Take 800 mg by mouth 3 (three) times daily with meals.    [provider]  thiamine 100 MG tablet Take 1 tablet (100 mg total) by mouth daily. 11/08/20   Aline August, MD  ticagrelor (BRILINTA) 90 MG TABS tablet Take 90 mg by mouth 2 (two) times daily.    [provider]  timolol (TIMOPTIC) 0.5 % ophthalmic solution Place 1 drop into both eyes daily. 04/04/19   [provider]   traMADol (ULTRAM) 50 MG tablet Take 1 tablet (50 mg total) by mouth 2 (two) times daily as needed. 11/20/20   Marybelle Killings, MD    Current Facility-Administered Medications  Medication Dose Route Frequency Provider Last Rate Last Admin  . 0.9 %  sodium chloride infusion  10 mL/hr Intravenous Once Howerter, Justin B, DO      . albuterol (VENTOLIN HFA) 108 (90 Base) MCG/ACT inhaler 1-2 puff  1-2 puff Inhalation Q4H PRN Howerter, Justin B, DO      . bictegravir-emtricitabine-tenofovir AF (BIKTARVY) 50-200-25 MG per tablet 1 tablet  1 tablet Oral Daily Howerter, Justin B, DO   1 tablet at 11/25/20 0932  . insulin aspart (novoLOG) injection 0-6 Units  0-6 Units Subcutaneous Q6H Howerter, Justin B, DO      . latanoprost (XALATAN) 0.005 % ophthalmic solution 1 drop  1 drop Both Eyes QHS Howerter, Justin B, DO   1 drop at 11/24/20 2332  . LORazepam (ATIVAN) injection 0.5 mg  0.5 mg Intravenous Q4H PRN Howerter, Justin B, DO      . pantoprazole (PROTONIX) injection 40 mg  40 mg Intravenous Q12H Howerter, Justin B, DO   40 mg at 11/25/20 0618  . timolol (TIMOPTIC) 0.5 % ophthalmic solution 1 drop  1 drop Both Eyes Daily Howerter, Justin B, DO   1 drop at 11/25/20 0923  . umeclidinium-vilanterol (ANORO ELLIPTA) 62.5-25 MCG/INH 1 puff  1 puff Inhalation Daily Howerter, Justin B, DO   1 puff at 11/25/20 2353    Allergies as of 11/24/2020 - Review Complete 11/24/2020  Allergen Reaction Noted  . Augmentin [amoxicillin-pot clavulanate] Diarrhea 12/05/2018  . Amphetamines Other (See Comments) 01/15/2012    Family History  Problem Relation Age of Onset  . Heart failure Father   . Hypertension Father   . Diabetes Brother   . Heart attack Brother   . Alzheimer's disease Mother   . Stroke Sister   . Diabetes Sister   . Alzheimer's disease Sister   . Hypertension Brother   . Diabetes Brother   . Drug abuse Brother   . Colon cancer Neg Hx     Social History   Socioeconomic History  . Marital  status: Single    Spouse name:  Not on file  . Number of children: 1  . Years of education: Not on file  . Highest education level: Not on file  Occupational History  . Occupation: retired  Tobacco Use  . Smoking status: Current Every Day Smoker    Packs/day: 0.50    Years: 43.00    Pack years: 21.50    Types: Cigarettes  . Smokeless tobacco: Never Used  Vaping Use  . Vaping Use: Never used  Substance and Sexual Activity  . Alcohol use: Yes    Alcohol/week: 12.0 standard drinks    Types: 12 Standard drinks or equivalent per week    Comment: occ  . Drug use: Not on file    Comment: hx of crack/cocaine 3yrs ago 10/01/2019- none  . Sexual activity: Yes    Partners: Male  Other Topics Concern  . Not on file  Social History Narrative   Retired.       As of 05/2015:   Diet: No salt    Caffeine   Married: Single    House: Condo, 2 stories, 1 person (self)    Pets: No    Current/Past profession: N/A   Exercise: walks daily    Living Will: Yes    DNR   POA/HPOA: No       Social Determinants of Health   Financial Resource Strain: Low Risk   . Difficulty of Paying Living Expenses: Not hard at all  Food Insecurity: No Food Insecurity  . Worried About Charity fundraiser in the Last Year: Never true  . Ran Out of Food in the Last Year: Never true  Transportation Needs: No Transportation Needs  . Lack of Transportation (Medical): No  . Lack of Transportation (Non-Medical): No  Physical Activity: Not on file  Stress: Not on file  Social Connections: Not on file  Intimate Partner Violence: Not on file    Review of Systems: ROS is O/W negative except as mentioned in HPI.  Physical Exam: Vital signs in last 24 hours: Temp:  [97.5 F (36.4 C)-98.2 F (36.8 C)] 97.7 F (36.5 C) (02/02 1155) Pulse Rate:  [73-90] 78 (02/02 1155) Resp:  [14-25] 18 (02/02 1155) BP: (100-136)/(53-70) 119/68 (02/02 1155) SpO2:  [96 %-100 %] 100 % (02/02 1155)   General: Alert,  Well-developed, well-nourished, in NAD; uncooperative and agitated Head:  Normocephalic and atraumatic. Eyes:  Sclera clear, no icterus.  Conjunctiva pink. Ears:  Normal auditory acuity. Mouth:  No deformity or lesions.   Lungs:  Clear throughout to auscultation.  No wheezes, crackles, or rhonchi.  Heart:  Regular rate and rhythm; no murmurs, clicks, rubs, or gallops. Abdomen:  Soft, non-distended.  BS present.  Non-tender. Msk:  Symmetrical without gross deformities. Pulses:  Normal pulses noted. Extremities:  Without clubbing or edema. Neurologic:  Alert and oriented x 4;  grossly normal neurologically. Skin:  Intact without significant lesions or rashes. Psych:  Alert and cooperative. Normal mood and affect.  Intake/Output from previous day: 02/01 0701 - 02/02 0700 In: 1771.3 [Blood:1771.3] Out: -   Lab Results: Recent Labs    11/24/20 1525 11/24/20 1606 11/25/20 0637  WBC 5.7  --  6.8  HGB 4.7* 5.1* 7.0*  HCT 16.1* 15.0* 22.2*  PLT 180  --  171   BMET Recent Labs    11/24/20 1525 11/24/20 1606 11/25/20 0637  NA 141 142 142  K 3.7 3.6 4.1  CL 99  --  100  CO2 24  --  28  GLUCOSE 146*  --  127*  BUN 89*  --  105*  CREATININE 6.13*  --  6.84*  CALCIUM 7.6*  --  7.7*   LFT Recent Labs    11/25/20 0637  PROT 6.1*  ALBUMIN 2.4*  AST 22  ALT 13  ALKPHOS 73  BILITOT 1.1   PT/INR Recent Labs    11/24/20 1525 11/25/20 0524  LABPROT 14.7 14.7  INR 1.2 1.2   Studies/Results: DG CHEST PORT 1 VIEW  Result Date: 11/24/2020 CLINICAL DATA:  Weakness, COVID-19 positivity EXAM: PORTABLE CHEST 1 VIEW COMPARISON:  11/05/2020 FINDINGS: Cardiac shadow is enlarged but stable. Postsurgical changes are again seen. Aortic calcifications are noted. Lungs are well aerated bilaterally. Mild patchy airspace opacities are noted consistent with multifocal pneumonia. No bony abnormality is noted. IMPRESSION: Patchy airspace opacity consistent with the known history of COVID-19  positivity. Electronically Signed   By: Inez Catalina M.D.   On: 11/24/2020 20:24   IMPRESSION:  *Acute on chronic anemia in the setting of CKD:  Baseline hemoglobin range of 7-9 grams, presenting hemoglobin noted to be 4.7 relative to most recent prior value of 9.8 on 11/06/2020.  Transfuse 2 units PRBCs and increased to 7.0 grams. *Heme positive stools:  Came in with reports of black stools and was heme positive. *History of bleeding gastric AVMs requiring intervention *Covid positive *ESRD on HD MWF *HIV *CAD s/p DES in 11/2019 on Brilinta  PLAN: -Currently patient patient wants to eat and is not agreeable to any procedures.  I am placing him on a soft diet.  ? EGD if he is agreeable when Dr. Rush Landmark sees him. -Monitor Hgb and transfuse prn. -Continue pantoprazole 40 mg IV BID for now.   Laban Emperor. Nicola Heinemann  11/25/2020, 12:41 PM

## 2020-11-25 NOTE — Consult Note (Signed)
[facility] Renal Consultation Note    Indication for Consultation:  Management of ESRD/hemodialysis; anemia, hypertension/volume and secondary hyperparathyroidism  HPI: Jacob [name] is a 70 y.o. male with a past medical history significant for hypertension, coronary artery disease, hepatitis B, HIV, congestive heart failure (EF 40 to 45%), end-stage renal disease on hemodialysis (MWF), Etoh use, chronic GI bleeds (requiring multiple transfusions due to gastric AVM's), and recent hospitalization for a fall complicated by small subdural hematoma and L greater trochanter fracture who presented to [facility] ED 11/24/20 with a few days history of weakness and black stools.  In the ED he was somewhat somnolent but VSS.  Labs were notalbe for an Hgb of 4.7, covid -19 PCR + (was negative at last hospitalization 11/05/20).  He was admitted for blood transfusions and we were consulted to provide dialysis during his hospitalization.  He is currently angry and wants to go home and have dialysis at his "regular unit".  He also reports that he completed his covid vaccination series as well as having the booster.  He reports some DOE but no fevers, chills, cough.  Past Medical History:  Diagnosis Date  . Acute respiratory failure ([place]) 03/01/2018  . Arthritis    "all over; mostly knees and back" (02/28/2018)  . Chronic lower back pain    stenosis  . Community acquired pneumonia 09/06/2013  . COPD (chronic obstructive pulmonary disease) ([place])   . Coronary atherosclerosis of native coronary artery    a. 02/2003 s/p CABG x 2 (VG->RI, VG->RPDA; b. 11/2019 PCI: LM nl, LAD 90d, D3 50, RI 100, LCX 100p, OM3 100 - fills via L->L collats from D2/dLAD, RCA 100p, VG->RPDA ok, VG->RI 95 (3.5x48 Synergy XD DES).  . Drug abuse ([place])    hx; tested for cocaine as recently as 2/08. says he is not using drugs now - avoided defib. for this reason   . ESRD (end stage renal disease) ([place])    Hemo M-W-F- [facility]a Blade  . Fall at  home 10/2020  . GERD (gastroesophageal reflux disease)    takes OTC meds as needed  . GI bleeding    a. 11/2019 EGD: angiodysplastic lesions w/ bleeding s/p argon plasma/clipping/epi inj.  . Glaucoma    uses eye drops daily  . Hepatitis B 1968   "tx'd w/isolation; caught it from toilet stools in gym"  . HFrEF (heart failure with reduced ejection fraction) ([place])    a. 01/2019 Echo: EF 40-45%, diffuse HK, mild basal septal hypertrophy.  . History of blood transfusion 03/01/2019  . History of colon polyps    benign  . History of gout    takes Allopurinol daily as well as Colchicine-if needed (02/28/2018)  . History of kidney stones   . HTN (hypertension)    takes Coreg,Imdur.and Apresoline daily  . Human immunodeficiency virus (HIV) disease ([place]) dx'd 1995   takes Genvoya daily  . Hyperlipidemia    takes Atorvastatin daily  . Ischemic cardiomyopathy    a. 01/2019 Echo: EF 40-45%, diffuse HK, mild basal septal hypertrophy. Diast dysfxn. Nl RV size/fxn. Sev dil LA. Triv MR/TR/PR.  [name] Muscle spasm    takes Zanaflex as needed  . Myocardial infarction ([place]) ~ 2004/2005  . Nocturia   . Peripheral neuropathy    takes gabapentin daily  . Pneumonia    "at least twice" (02/28/2018)  . Shortness of breath dyspnea    rarely but if notices it then with exertion  . Syphilis, unspecified   . Type  II diabetes mellitus ([place]) 2004   Lantus daily.Average fasting blood sugar 125-199  . Wears glasses   . Wears partial dentures    Past Surgical History:  Procedure Laterality Date  . AV FISTULA PLACEMENT Left 08/02/2018   Procedure: ARTERIOVENOUS (AV) FISTULA CREATION  left arm radiocephlic;  Surgeon: Marty Heck, MD;  Location: [place];  Service: Vascular;  Laterality: Left;  . AV FISTULA PLACEMENT Left 08/01/2019   Procedure: LEFT BRACHIOCEPHALIC ARTERIOVENOUS (AV) FISTULA CREATION;  Surgeon: [name], MD;  Location: [place];  Service: Vascular;  Laterality: Left;  . BASCILIC VEIN  TRANSPOSITION Left 10/03/2019   Procedure: BASILIC VEIN TRANSPOSITION LEFT SECOND STAGE;  Surgeon: [name], MD;  Location: [place];  Service: Vascular;  Laterality: Left;  . CARDIAC CATHETERIZATION  10/2002; 12/19/2004   [name] 03/08/2011  . COLONOSCOPY  2013   Dr.John Henrene Pastor   . CORONARY ARTERY BYPASS GRAFT  02/24/2003   CABG X2/notes 03/08/2011  . CORONARY STENT INTERVENTION N/A 12/19/2019   Procedure: CORONARY STENT INTERVENTION;  Surgeon: Jettie Booze, MD;  Location: [place] CV LAB;  Service: Cardiovascular;  Laterality: N/A;  . ESOPHAGOGASTRODUODENOSCOPY (EGD) WITH PROPOFOL N/A 02/08/2019   Procedure: ESOPHAGOGASTRODUODENOSCOPY (EGD) WITH PROPOFOL;  Surgeon: Milus Banister, MD;  Location: [place];  Service: Gastroenterology;  Laterality: N/A;  . ESOPHAGOGASTRODUODENOSCOPY (EGD) WITH PROPOFOL N/A 12/22/2019   Procedure: ESOPHAGOGASTRODUODENOSCOPY (EGD) WITH PROPOFOL;  Surgeon: Lavena Bullion, DO;  Location: [place];  Service: Gastroenterology;  Laterality: N/A;  . ESOPHAGOGASTRODUODENOSCOPY (EGD) WITH PROPOFOL N/A 10/19/2020   Procedure: ESOPHAGOGASTRODUODENOSCOPY (EGD) WITH PROPOFOL;  Surgeon: Jackquline Denmark, MD;  Location: [facility] ENDOSCOPY;  Service: Endoscopy;  Laterality: N/A;  . HEMOSTASIS CLIP PLACEMENT  12/22/2019   Procedure: HEMOSTASIS CLIP PLACEMENT;  Surgeon: Lavena Bullion, DO;  Location: [place];  Service: Gastroenterology;;  . HOT HEMOSTASIS N/A 02/08/2019   Procedure: HOT HEMOSTASIS (ARGON PLASMA COAGULATION/BICAP);  Surgeon: Milus Banister, MD;  Location: [facility] ENDOSCOPY;  Service: Gastroenterology;  Laterality: N/A;  . HOT HEMOSTASIS N/A 12/22/2019   Procedure: HOT HEMOSTASIS (ARGON PLASMA COAGULATION/BICAP);  Surgeon: Lavena Bullion, DO;  Location: [facility] ENDOSCOPY;  Service: Gastroenterology;  Laterality: N/A;  . HOT HEMOSTASIS N/A 10/19/2020   Procedure: HOT HEMOSTASIS (ARGON PLASMA COAGULATION/BICAP);  Surgeon: Jackquline Denmark, MD;  Location: [facility]  ENDOSCOPY;  Service: Endoscopy;  Laterality: N/A;  . INTERTROCHANTERIC HIP FRACTURE SURGERY Left 11/2006   [name] 03/08/2011  . INTRAVASCULAR ULTRASOUND/IVUS N/A 12/19/2019   Procedure: Intravascular Ultrasound/IVUS;  Surgeon: Jettie Booze, MD;  Location: [place] CV [facility];  Service: Cardiovascular;  Laterality: N/A;  . IR FLUORO GUIDE CV LINE RIGHT  07/24/2019  . IR FLUORO GUIDE CV LINE RIGHT  07/30/2019  . IR US GUIDE VASC ACCESS RIGHT  07/24/2019  . IR US GUIDE VASC ACCESS RIGHT  07/30/2019  . LAPAROSCOPIC CHOLECYSTECTOMY  05/2006  . LIGATION OF COMPETING BRANCHES OF ARTERIOVENOUS FISTULA Left 11/05/2018   Procedure: LIGATION OF COMPETING BRANCHES OF ARTERIOVENOUS FISTULA  LEFT  ARM;  Surgeon: Marty Heck, MD;  Location: [place];  Service: Vascular;  Laterality: Left;  . LUMBAR LAMINECTOMY/DECOMPRESSION MICRODISCECTOMY N/A 02/29/2016   Procedure: Left L4-5 Lateral Recess Decompression, Removal Extradural Intraspinal Facet Cyst;  Surgeon: Marybelle Killings, MD;  Location: [place];  Service: Orthopedics;  Laterality: N/A;  . MULTIPLE TOOTH EXTRACTIONS    . ORIF MANDIBULAR FRACTURE Left 08/13/2004   ORIF of left body fracture mandible with KLS Martin 2.3-mm six hole/notes 03/08/2011  .  RIGHT/LEFT HEART CATH AND CORONARY/GRAFT ANGIOGRAPHY N/A 12/19/2019   Procedure: RIGHT/LEFT HEART CATH AND CORONARY/GRAFT ANGIOGRAPHY;  Surgeon: Jettie Booze, MD;  Location: [place] CV LAB;  Service: Cardiovascular;  Laterality: N/A;  . SCLEROTHERAPY  12/22/2019   Procedure: SCLEROTHERAPY;  Surgeon: Lavena Bullion, DO;  Location: [facility] ENDOSCOPY;  Service: Gastroenterology;;   Family History:   Family History  Problem Relation Age of Onset  . Heart failure Father   . Hypertension Father   . Diabetes Brother   . Heart attack Brother   . Alzheimer's disease Mother   . Stroke Sister   . Diabetes Sister   . Alzheimer's disease Sister   . Hypertension Brother   . Diabetes Brother   . Drug abuse  Brother   . Colon cancer Neg Hx    Social History:  reports that he has been smoking cigarettes. He has a 21.50 pack-year smoking history. He has never used smokeless tobacco. He reports current alcohol use of about 12.0 standard drinks of alcohol per week.  Drug: Cocaine. Allergies  Allergen Reactions  . Augmentin [Amoxicillin-Pot Clavulanate] Diarrhea    Severe diarrhea  . Amphetamines Other (See Comments)    Unknown reaction type   Prior to Admission medications   Medication Sig Start Date End Date Taking? Authorizing Provider  acetaminophen (TYLENOL) 650 MG CR tablet Take 650 mg by mouth 3 (three) times daily.    [provider]  allopurinol (ZYLOPRIM) 100 MG tablet TAKE 1 TABLET(100 MG) BY MOUTH DAILY Patient taking differently: Take 100 mg by mouth daily. 07/23/20   [name], NP  ANORO ELLIPTA 62.5-25 MCG/INH AEPB INHALE 1 PUFF BY MOUTH EVERY DAY Patient taking differently: Inhale 1 puff into the lungs daily. 12/11/19   [name], Dinah C, NP  B-D UF III MINI PEN NEEDLES 31G X 5 MM MISC USE FOUR TIMES DAILY 03/02/20   [name], NP  bictegravir-emtricitabine-tenofovir AF (BIKTARVY) 50-200-25 MG TABS tablet Take 1 tablet by mouth daily. 06/30/20   [name], MD  carvedilol (COREG) 3.125 MG tablet Take 1 tablet (3.125 mg total) by mouth 2 (two) times daily. 02/13/20   [name], MD  Cinacalcet HCl (SENSIPAR PO) Take by mouth.  08/14/20 08/13/21  [provider]  colchicine 0.6 MG tablet Take 0.6-1.2 mg by mouth daily as needed (gout flare-up).    [provider]  diclofenac Sodium (VOLTAREN) 1 % GEL APPLY 2 GRAMS TO THE AFFECTED AREA THREE TIMES DAILY AS NEEDED FOR PAIN 11/16/20   [name], NP  folic acid (FOLVITE) 1 MG tablet Take 1 tablet (1 mg total) by mouth daily. 11/08/20   Aline August, MD  gabapentin (NEURONTIN) 300 MG capsule Take 1 capsule (300 mg total) by mouth 2 (two) times daily. 09/22/20   [name], Dinah C, NP   insulin lispro (HUMALOG KWIKPEN) 100 UNIT/ML KwikPen Inject 0.39ml (20 Units total) into the skin 3 (three) times daily as needed for high blood sugar (above 150) Patient taking differently: Inject 20 Units into the skin 3 (three) times daily as needed (blood sugar >150 mg/dl). 03/12/20   [name], NP  iron sucrose in sodium chloride 0.9 % 100 mL Iron Sucrose (Venofer) 07/08/20 07/07/21  [provider]  isosorbide mononitrate (IMDUR) 30 MG 24 hr tablet TAKE 1 TABLET BY MOUTH EVERY DAY Patient taking differently: Take 30 mg by mouth daily. TAKE 1 TABLET BY MOUTH EVERY DAY 08/10/20   [name], NP  Lancets (  ONETOUCH DELICA PLUS [id number]) MISC Inject 1 Device as directed 3 (three) times daily. Dx: E11.40 09/18/19   [name], NP  latanoprost (XALATAN) 0.005 % ophthalmic solution Place 1 drop into both eyes at bedtime.    [provider]  Methoxy PEG-Epoetin Beta (MIRCERA IJ) Mircera 07/06/20   [provider]  multivitamin (RENA-VIT) TABS tablet Take 1 tablet by mouth daily.    [provider]  nitroGLYCERIN (NITROSTAT) 0.3 MG SL tablet ONE TABLET UNDER TONGUE AS NEEDED FOR CHEST PAIN Patient taking differently: Place 0.3 mg under the tongue every 5 (five) minutes as needed for chest pain (max 3 doses). 05/01/19   [name], MD  Nutritional Supplements (VITAMIN D BOOSTER PO) Take 1 each by mouth 2 (two) times daily. 08/12/20 08/11/21  [provider]  ONETOUCH ULTRA test strip CHECK BLOOD SUGAR 3 TIMES  DAILY 05/27/20   [name], NP  oxyCODONE-acetaminophen (PERCOCET/ROXICET) 5-325 MG tablet Take 1 tablet by mouth every 8 (eight) hours as needed for severe pain. 11/12/20   Marybelle Killings, MD  pantoprazole (PROTONIX) 40 MG tablet TAKE 1 TABLET(40 MG) BY MOUTH TWICE DAILY Patient taking differently: Take 40 mg by mouth 2 (two) times daily. 10/19/20   Fayrene Helper, MD  polyethylene glycol (MIRALAX / GLYCOLAX) 17 g  packet Take 17 g by mouth daily as needed for mild constipation. 11/07/20   Aline August, MD  sevelamer carbonate (RENVELA) 800 MG tablet Take 800 mg by mouth 3 (three) times daily with meals.    [provider]  thiamine 100 MG tablet Take 1 tablet (100 mg total) by mouth daily. 11/08/20   Aline August, MD  ticagrelor (BRILINTA) 90 MG TABS tablet Take 90 mg by mouth 2 (two) times daily.    [provider]  timolol (TIMOPTIC) 0.5 % ophthalmic solution Place 1 drop into both eyes daily. 04/04/19   [provider]  traMADol (ULTRAM) 50 MG tablet Take 1 tablet (50 mg total) by mouth 2 (two) times daily as needed. 11/20/20   Marybelle Killings, MD   Current Facility-Administered Medications  Medication Dose Route Frequency Provider Last Rate Last Admin  . 0.9 %  sodium chloride infusion  10 mL/hr Intravenous Once Howerter, Justin B, DO      . albuterol (VENTOLIN HFA) 108 (90 Base) MCG/ACT inhaler 1-2 puff  1-2 puff Inhalation Q4H PRN Howerter, Justin B, DO      . bictegravir-emtricitabine-tenofovir AF (BIKTARVY) 50-200-25 MG per tablet 1 tablet  1 tablet Oral Daily Howerter, Justin B, DO   1 tablet at 11/25/20 0932  . insulin aspart (novoLOG) injection 0-6 Units  0-6 Units Subcutaneous Q6H Howerter, Justin B, DO      . latanoprost (XALATAN) 0.005 % ophthalmic solution 1 drop  1 drop Both Eyes QHS Howerter, Justin B, DO   1 drop at 11/24/20 2332  . LORazepam (ATIVAN) injection 0.5 mg  0.5 mg Intravenous Q4H PRN Howerter, Justin B, DO      . pantoprazole (PROTONIX) injection 40 mg  40 mg Intravenous Q12H Howerter, Justin B, DO   40 mg at 11/25/20 0618  . timolol (TIMOPTIC) 0.5 % ophthalmic solution 1 drop  1 drop Both Eyes Daily Howerter, Justin B, DO   1 drop at 11/25/20 0923  . umeclidinium-vilanterol (ANORO ELLIPTA) 62.5-25 MCG/INH 1 puff  1 puff Inhalation Daily Howerter, Justin B, DO   1 puff at 11/25/20 9371   Labs: Basic Metabolic Panel: Recent Labs  Lab 11/24/20 1525  11/24/20 1606 11/25/20 0637  NA 141 142 142  K 3.7 3.6 4.1  CL 99  --  100  CO2 24  --  28  GLUCOSE 146*  --  127*  BUN 89*  --  105*  CREATININE 6.13*  --  6.84*  CALCIUM 7.6*  --  7.7*  PHOS  --   --  5.3*   Liver Function Tests: Recent Labs  Lab 11/25/20 0637  AST 22  ALT 13  ALKPHOS 73  BILITOT 1.1  PROT 6.1*  ALBUMIN 2.4*   No results for input(s): LIPASE, AMYLASE in the last 168 hours. No results for input(s): AMMONIA in the last 168 hours. CBC: Recent Labs  Lab 11/24/20 1525 11/24/20 1606 11/25/20 0637  WBC 5.7  --  6.8  NEUTROABS 4.9  --   --   HGB 4.7* 5.1* 7.0*  HCT 16.1* 15.0* 22.2*  MCV 107.3*  --  100.5*  PLT 180  --  171   Cardiac Enzymes: No results for input(s): CKTOTAL, CKMB, CKMBINDEX, TROPONINI in the last 168 hours. CBG: Recent Labs  Lab 11/24/20 2306 11/25/20 0056 11/25/20 0545 11/25/20 0803  GLUCAP 96 101* 111* 108*   Iron Studies:  Recent Labs    11/25/20 0637  IRON 45  TIBC 314  FERRITIN 299   Studies/Results: DG CHEST PORT 1 VIEW  Result Date: 11/24/2020 CLINICAL DATA:  Weakness, COVID-19 positivity EXAM: PORTABLE CHEST 1 VIEW COMPARISON:  11/05/2020 FINDINGS: Cardiac shadow is enlarged but stable. Postsurgical changes are again seen. Aortic calcifications are noted. Lungs are well aerated bilaterally. Mild patchy airspace opacities are noted consistent with multifocal pneumonia. No bony abnormality is noted. IMPRESSION: Patchy airspace opacity consistent with the known history of COVID-19 positivity. Electronically Signed   By: Inez Catalina M.D.   On: 11/24/2020 20:24    ROS: Pertinent items are noted in HPI. Physical Exam: Vitals:   11/25/20 0125 11/25/20 0311 11/25/20 0759 11/25/20 0842  BP: 114/61 (!) 109/56 116/69 123/63  Pulse: 74 73 79 74  Resp: 16 18 18 20   Temp: (!) 97.5 F (36.4 C) 97.6 F (36.4 C) 97.6 F (36.4 C) (!) 97.5 F (36.4 C)  TempSrc: Oral Axillary Oral Oral  SpO2: 100% 100% 100% 100%       Weight change:   Intake/Output Summary (Last 24 hours) at 11/25/2020 1126 Last data filed at 11/25/2020 0445 Gross per 24 hour  Intake 1771.33 ml  Output --  Net 1771.33 ml   BP 123/63 (BP Location: Right Arm)   Pulse 74   Temp (!) 97.5 F (36.4 C) (Oral)   Resp 20   SpO2 100%  General appearance: distracted, fatigued, uncooperative and agitated Head: Normocephalic, without obvious abnormality, atraumatic Resp: clear to auscultation bilaterally Cardio: regular rate and rhythm, S1, S2 normal, no murmur, click, rub or gallop GI: soft, non-tender; bowel sounds normal; no masses,  no organomegaly Extremities: extremities normal, atraumatic, no cyanosis or edema and LUE AVF +T/B Dialysis Access:  Hemodialysis prescription: [facility], MWF, 4 hours, Optiflux 180, BFR 400/DFR 800, EDW 78 kg, 2K/2.0 calcium, left upper arm AV fistula, 15 G needles, no heparin.  Venofer 50 mg IV weekly, calcitriol 3.25 mcg 3 times weekly, cinacalcet 90 mg 3 times weekly  Assessment/Plan: 1.  ABLA- has history of chronic UGI bleeds requiring transfusions.  Gi consulted 2. covid-19+ - appears incidental and no symptoms. 3.  ESRD -  Normally MWF but he is refusing dialysis  today.  Appears a little confused and has recent SDH and may need repeat CT of head.  Will reassess tomorrow. 4.  Hypertension/volume  -  Currently stable 5.  Anemia  -  As above 6. Small subdural hematoma - s/p fall last month 7. Left greater trochanter fracture- non surgical management per ortho. 8.  Metabolic bone disease -  Continue with outpatient meds when taking po 9.  Nutrition -  Renal diet. 10. HIV - on meds  Donetta Potts, MD [place] Pager [phone] 11/25/2020, 11:26 AM

## 2020-11-26 ENCOUNTER — Telehealth: Payer: Self-pay | Admitting: *Deleted

## 2020-11-26 ENCOUNTER — Telehealth: Payer: Self-pay | Admitting: Podiatry

## 2020-11-26 LAB — TYPE AND SCREEN
ABO/RH(D): A POS
Antibody Screen: NEGATIVE
Unit division: 0
Unit division: 0
Unit division: 0

## 2020-11-26 LAB — BPAM RBC
Blood Product Expiration Date: 202202082359
Blood Product Expiration Date: 202202282359
Blood Product Expiration Date: 202202282359
ISSUE DATE / TIME: 202202012014
ISSUE DATE / TIME: 202202020043
ISSUE DATE / TIME: 202202020818
Unit Type and Rh: 6200
Unit Type and Rh: 6200
Unit Type and Rh: 6200

## 2020-11-26 LAB — CALCIUM, IONIZED: Calcium, Ionized, Serum: 4.2 mg/dL — ABNORMAL LOW (ref 4.5–5.6)

## 2020-11-26 LAB — PATHOLOGIST SMEAR REVIEW

## 2020-11-26 SURGERY — ENTEROSCOPY
Anesthesia: Monitor Anesthesia Care
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
## 2020-11-26 NOTE — Telephone Encounter (Signed)
I'm not sure I understand, he should continue his dialysis schedule and see me. If he has difficulty scheduling conflicting with his dialysis sessions he may need to see another provider (or see me in [place])

## 2020-11-26 NOTE — Discharge Summary (Signed)
Physician Against Medical Advice Discharge Summary  Jacob [name] BTD:[id number] DOB: 06-Jun-1951 DOA: 11/24/2020  PCP: [name], NP  Admit date: 11/24/2020 Discharge date: 11/26/2020  Admitted From: home Disposition:  Home, left AMA  Recommendations for Outpatient Follow-up:  1. Follow up with PCP ASAP  Discharge Condition: guarded, left AMA while having a GI Bleed CODE STATUS: Full code Diet recommendation: renal   HPI: 70 year old male with history of CAD with history of CABG, also drug-eluting stent in February 2021, chronic combined CHF with EF of 40-45% in December 2021, ESRD on HD MWF, recurrent upper GI bleeds requiring blood transfusion, smoker, drinks EtOH but has not had anything in the last month, comes into the hospital with black stools.  This has been going on for the last couple of days.  His hemoglobin was found to be in the 4 range and was given 2 units of red blood cells.  Incidentally he was also Covid positive but has no symptoms  Hospital Course / Discharge diagnoses: Principal Problem Upper GI bleed with acute blood loss anemia, underlying chronic anemia in the setting of renal disease-patient was given 2 units of packed red blood cells, hemoglobin improved to 7.0 and transfused a 3rd unit. GI consulted and plans were in place to get an EGD on 2/3 however 2/2 in the evening patient decided to leave AMA. This provider could not discuss with patient since it was after hours.   Active Problems COVID-19 positive, incidental-no symptoms. ESRD-continue outpatient HD Chronic alcohol use-no signs of withdrawal however didn't spend too much time here DM2  HIV  COPD, ongoing tobacco use-continue inhalers, no wheezing  Sepsis ruled out   Discharge Instructions   Allergies as of 11/25/2020      Reactions   Augmentin [amoxicillin-pot Clavulanate] Diarrhea   Severe diarrhea   Amphetamines Other (See Comments)   Unknown reaction      Medication List     ASK your doctor about these medications   acetaminophen 650 MG CR tablet Commonly known as: TYLENOL Take 650 mg by mouth 3 (three) times daily.   allopurinol 100 MG tablet Commonly known as: ZYLOPRIM TAKE 1 TABLET(100 MG) BY MOUTH DAILY   Anoro Ellipta 62.5-25 MCG/INH Aepb Generic drug: umeclidinium-vilanterol INHALE 1 PUFF BY MOUTH EVERY DAY   B-D UF III MINI PEN NEEDLES 31G X 5 MM Misc Generic drug: Insulin Pen Needle USE FOUR TIMES DAILY   Biktarvy 50-200-25 MG Tabs tablet Generic drug: bictegravir-emtricitabine-tenofovir AF Take 1 tablet by mouth daily.   carvedilol 3.125 MG tablet Commonly known as: COREG Take 1 tablet (3.125 mg total) by mouth 2 (two) times daily.   colchicine 0.6 MG tablet Take 0.6-1.2 mg by mouth daily as needed (gout flare-up).   diclofenac Sodium 1 % Gel Commonly known as: VOLTAREN APPLY 2 GRAMS TO THE AFFECTED AREA THREE TIMES DAILY AS NEEDED FOR PAIN   folic acid 1 MG tablet Commonly known as: FOLVITE Take 1 tablet (1 mg total) by mouth daily.   gabapentin 300 MG capsule Commonly known as: NEURONTIN Take 1 capsule (300 mg total) by mouth 2 (two) times daily.   insulin lispro 100 UNIT/ML KwikPen Commonly known as: HumaLOG KwikPen Inject 0.5ml (20 Units total) into the skin 3 (three) times daily as needed for high blood sugar (above 150)   iron sucrose in sodium chloride 0.9 % 100 mL Iron Sucrose (Venofer)   isosorbide mononitrate 30 MG 24 hr tablet Commonly known as: IMDUR TAKE 1 TABLET  BY MOUTH EVERY DAY   latanoprost 0.005 % ophthalmic solution Commonly known as: XALATAN Place 1 drop into both eyes at bedtime.   MIRCERA IJ Mircera   multivitamin Tabs tablet Take 1 tablet by mouth daily.   nitroGLYCERIN 0.3 MG SL tablet Commonly known as: NITROSTAT ONE TABLET UNDER TONGUE AS NEEDED FOR CHEST PAIN   OneTouch Delica Plus [id number] Misc Inject 1 Device as directed 3 (three) times daily. Dx: E11.40   OneTouch Ultra test  strip Generic drug: glucose blood CHECK BLOOD SUGAR 3 TIMES  DAILY   oxyCODONE-acetaminophen 5-325 MG tablet Commonly known as: PERCOCET/ROXICET Take 1 tablet by mouth every 8 (eight) hours as needed for severe pain.   pantoprazole 40 MG tablet Commonly known as: PROTONIX TAKE 1 TABLET(40 MG) BY MOUTH TWICE DAILY   polyethylene glycol 17 g packet Commonly known as: MIRALAX / GLYCOLAX Take 17 g by mouth daily as needed for mild constipation.   SENSIPAR PO Take by mouth.   sevelamer carbonate 800 MG tablet Commonly known as: RENVELA Take 800 mg by mouth 3 (three) times daily with meals.   thiamine 100 MG tablet Take 1 tablet (100 mg total) by mouth daily.   ticagrelor 90 MG Tabs tablet Commonly known as: BRILINTA Take 90 mg by mouth 2 (two) times daily.   timolol 0.5 % ophthalmic solution Commonly known as: TIMOPTIC Place 1 drop into both eyes daily.   traMADol 50 MG tablet Commonly known as: ULTRAM Take 1 tablet (50 mg total) by mouth 2 (two) times daily as needed.   VITAMIN D BOOSTER PO Take 1 each by mouth 2 (two) times daily.       Consultations:  None   Procedures/Studies:  CT HEAD WO CONTRAST  Result Date: 11/06/2020 CLINICAL DATA:  Subdural hematoma.  Fall at home. EXAM: CT HEAD WITHOUT CONTRAST TECHNIQUE: Contiguous axial images were obtained from the base of the skull through the vertex without intravenous contrast. COMPARISON:  November 05, 2020. FINDINGS: Brain: No change and suspected trace subdural hemorrhage along the posterior falx (see series 5, image 47 and series 3, image 20). No new hemorrhage. No evidence of acute large vascular territory infarct. No abnormal mass effect. No midline shift. No hydrocephalus. No mass lesion. Vascular: Calcific atherosclerosis. Skull: No acute fracture. Sinuses/Orbits: Mild scattered ethmoid air cell mucosal thickening without air-fluid levels. Unremarkable orbits. Other: No mastoid effusions. IMPRESSION: No change in  suspected trace subdural hemorrhage along the posterior falx. No new hemorrhage. Electronically Signed   By: Margaretha Sheffield MD   On: 11/06/2020 20:04   CT Head Wo Contrast  Result Date: 11/05/2020 CLINICAL DATA:  Fall, posterior head strike EXAM: CT HEAD WITHOUT CONTRAST TECHNIQUE: Contiguous axial images were obtained from the base of the skull through the vertex without intravenous contrast. COMPARISON:  CT 10/21/2016 FINDINGS: Brain: There is some new trace hyperdense thickening along the posterior falx (3/22) concerning for trace subdural blood. Finding is however on a background of some diffuse hyperattenuating mineralization along the falx and tentorium though the remainder of which appears stable from comparison. No additional sites concerning for acute intracranial hemorrhage. No other extra-axial collection is seen. No mass effect or midline shift. No CT evident areas of infarction. Patchy areas of white matter hypoattenuation are most compatible with chronic microvascular angiopathy. Symmetric prominence of the ventricles, cisterns and sulci compatible with parenchymal volume loss. Vascular: Atherosclerotic calcification of the carotid siphons and intradural vertebral arteries. No hyperdense vessel. Skull: There are chronic areas of  right occipital scarring with new areas of soft tissue thickening along the posterior scalp. No subjacent calvarial fracture is seen. No other acute osseous abnormalities or worrisome osseous lesions. Sinuses/Orbits: Paranasal sinuses and mastoid air cells are predominantly clear. Included orbital structures are unremarkable. Other: None IMPRESSION: 1. New trace hyperdense thickening along the posterior falx (3/22) concerning for trace subdural blood in the setting of fall with head strike. Finding is however on a background of some diffuse hyperattenuating mineralization along the falx and tentorium though the remainder of which appears stable from comparison. Could  consider short-term (6 hour) follow-up imaging to assess for stability. 2. Chronic areas of right occipital scarring with new areas of soft tissue thickening along the posterior scalp. No subjacent calvarial fracture. Correlate for point tenderness. 3. Chronic microvascular angiopathy and parenchymal volume loss. Intracranial atherosclerosis. Critical Value/emergent results were called by telephone at the time of interpretation on 11/05/2020 at 1:18 am to provider [facility], who verbally acknowledged these results. Electronically Signed   By: Lovena Le M.D.   On: 11/05/2020 01:19   DG CHEST PORT 1 VIEW  Result Date: 11/24/2020 CLINICAL DATA:  Weakness, COVID-19 positivity EXAM: PORTABLE CHEST 1 VIEW COMPARISON:  11/05/2020 FINDINGS: Cardiac shadow is enlarged but stable. Postsurgical changes are again seen. Aortic calcifications are noted. Lungs are well aerated bilaterally. Mild patchy airspace opacities are noted consistent with multifocal pneumonia. No bony abnormality is noted. IMPRESSION: Patchy airspace opacity consistent with the known history of COVID-19 positivity. Electronically Signed   By: Inez Catalina M.D.   On: 11/24/2020 20:24   DG Chest Port 1 View  Result Date: 11/05/2020 CLINICAL DATA:  Preoperative examination EXAM: PORTABLE CHEST 1 VIEW COMPARISON:  10/16/2020 FINDINGS: In unchanged moderate cardiomegaly with calcific atherosclerosis. Multiple old right-sided rib fractures. Remote median sternotomy. No focal airspace consolidation or pulmonary edema. IMPRESSION: Unchanged moderate cardiomegaly without focal airspace disease. Electronically Signed   By: Ulyses Jarred M.D.   On: 11/05/2020 02:24   DG Hip Unilat W or Wo Pelvis 2-3 Views Left  Result Date: 11/04/2020 CLINICAL DATA:  Fall EXAM: DG HIP (WITH OR WITHOUT PELVIS) 2-3V LEFT COMPARISON:  CT 10/16/2020, 12/08/2015 FINDINGS: SI joints are non widened. Extensive vascular calcifications. Pubic symphysis and rami appear intact. Right  femoral head projects in joint. Intramedullary rod and distal fixating screw within the left femur. Suspected acute nondisplaced fracture involving the greater trochanter of the femur. IMPRESSION: Suspected acute nondisplaced fracture involving the greater trochanter of the left femur. Electronically Signed   By: Donavan Foil M.D.   On: 11/04/2020 23:16      The results of significant diagnostics from this hospitalization (including imaging, microbiology, ancillary and laboratory) are listed below for reference.     Microbiology: Recent Results (from the past 240 hour(s))  SARS Coronavirus 2 by RT PCR (hospital order, performed in [facility] hospital lab) Nasopharyngeal Nasopharyngeal Swab     Status: Abnormal   Collection Time: 11/24/20  4:39 PM   Specimen: Nasopharyngeal Swab  Result Value Ref Range Status   SARS Coronavirus 2 POSITIVE (A) NEGATIVE Final    Comment: RESULT CALLED TO, READ BACK BY AND VERIFIED WITH: RN MISSY E. [id number] FCP (NOTE) SARS-CoV-2 target nucleic acids are DETECTED  SARS-CoV-2 RNA is generally detectable in upper respiratory specimens  during the acute phase of infection.  Positive results are indicative  of the presence of the identified virus, but do not rule out bacterial infection or co-infection with other pathogens not detected  by the test.  Clinical correlation with patient history and  other diagnostic information is necessary to determine patient infection status.  The expected result is negative.  Fact Sheet for Patients:   StrictlyIdeas.no   Fact Sheet for Healthcare Providers:   BankingDealers.co.za    This test is not yet approved or cleared by the [place] FDA and  has been authorized for detection and/or diagnosis of SARS-CoV-2 by FDA under an Emergency Use Authorization (EUA).  This EUA will remain in effect (meaning this test can  be used) for the duration of  the COVID-19  declaration under Section 564(b)(1) of the Act, 21 U.S.C. section 360-bbb-3(b)(1), unless the authorization is terminated or revoked sooner.  Performed at [facility], [place] [address], [place], [place] [address]      Labs: Basic Metabolic Panel: Recent Labs  Lab 11/24/20 1525 11/24/20 1606 11/25/20 0637  NA 141 142 142  K 3.7 3.6 4.1  CL 99  --  100  CO2 24  --  28  GLUCOSE 146*  --  127*  BUN 89*  --  105*  CREATININE 6.13*  --  6.84*  CALCIUM 7.6*  --  7.7*  MG  --   --  2.1  PHOS  --   --  5.3*   Liver Function Tests: Recent Labs  Lab 11/25/20 0637  AST 22  ALT 13  ALKPHOS 73  BILITOT 1.1  PROT 6.1*  ALBUMIN 2.4*   CBC: Recent Labs  Lab 11/24/20 1525 11/24/20 1606 11/25/20 0637  WBC 5.7  --  6.8  NEUTROABS 4.9  --   --   HGB 4.7* 5.1* 7.0*  HCT 16.1* 15.0* 22.2*  MCV 107.3*  --  100.5*  PLT 180  --  171   CBG: Recent Labs  Lab 11/25/20 0545 11/25/20 0803 11/25/20 1159 11/25/20 1629 11/25/20 1839  GLUCAP 111* 108* 97 135* 121*   Hgb A1c No results for input(s): HGBA1C in the last 72 hours. Lipid Profile No results for input(s): CHOL, HDL, LDLCALC, TRIG, CHOLHDL, LDLDIRECT in the last 72 hours. Thyroid function studies No results for input(s): TSH, T4TOTAL, T3FREE, THYROIDAB in the last 72 hours.  Invalid input(s): FREET3 Urinalysis    Component Value Date/Time   COLORURINE YELLOW 10/16/2020 2136   APPEARANCEUR CLEAR 10/16/2020 2136   LABSPEC 1.016 10/16/2020 2136   PHURINE 5.0 10/16/2020 2136   GLUCOSEU NEGATIVE 10/16/2020 2136   GLUCOSEU NEG mg/dL 09/20/2010 2058   HGBUR SMALL (A) 10/16/2020 2136   HGBUR negative 05/31/2010 0948   BILIRUBINUR NEGATIVE 10/16/2020 2136   [place] NEGATIVE 10/16/2020 2136   PROTEINUR 30 (A) 10/16/2020 2136   UROBILINOGEN 0.2 06/24/2015 2336   NITRITE NEGATIVE 10/16/2020 2136   LEUKOCYTESUR NEGATIVE 10/16/2020 2136    FURTHER DISCHARGE INSTRUCTIONS:   Get Medicines reviewed and  adjusted: Please take all your medications with you for your next visit with your Primary MD   Laboratory/radiological data: Please request your Primary MD to go over all hospital tests and procedure/radiological results at the follow up, please ask your Primary MD to get all Hospital records sent to his/her office.   In some cases, they will be blood work, cultures and biopsy results pending at the time of your discharge. Please request that your primary care M.D. goes through all the records of your hospital data and follows up on these results.   Also Note the following: If you experience worsening of your admission symptoms, develop shortness of  breath, life threatening emergency, suicidal or homicidal thoughts you must seek medical attention immediately by calling 911 or calling your MD immediately  if symptoms less severe.   You must read complete instructions/literature along with all the possible adverse reactions/side effects for all the Medicines you take and that have been prescribed to you. Take any new Medicines after you have completely understood and accpet all the possible adverse reactions/side effects.    Do not drive when taking Pain medications or sleeping medications (Benzodaizepines)   Do not take more than prescribed Pain, Sleep and Anxiety Medications. It is not advisable to combine anxiety,sleep and pain medications without talking with your primary care practitioner   Special Instructions: If you have smoked or chewed Tobacco  in the last 2 yrs please stop smoking, stop any regular Alcohol  and or any Recreational drug use.   Wear Seat belts while driving.   Please note: You were cared for by a hospitalist during your hospital stay. Once you are discharged, your primary care physician will handle any further medical issues. Please note that NO REFILLS for any discharge medications will be authorized once you are discharged, as it is imperative that you return to your  primary care physician (or establish a relationship with a primary care physician if you do not have one) for your post hospital discharge needs so that they can reassess your need for medications and monitor your lab values.  Time coordinating discharge: 5 minutes  SIGNED:  Marzetta Board, MD, PhD 11/26/2020, 11:40 AM

## 2020-11-26 NOTE — Telephone Encounter (Signed)
Patient called and stated should he wait to get an appointment with Dr. Sherryle Lis since he has not been to dialysis? He is concerned about his right foot.

## 2020-11-26 NOTE — Telephone Encounter (Signed)
I have made the 1st attempt to contact the patient or family member in charge, in order to follow up from recently being discharged from the hospital. I left a message on voicemail but I will make another attempt at a different time.  

## 2020-11-26 NOTE — Telephone Encounter (Signed)
Pt stated that he will just call back when he can to get scheduled. He went to the hospital this week and needs to figure out what's best for him.

## 2020-11-26 NOTE — Telephone Encounter (Signed)
OK - thanks

## 2020-11-27 ENCOUNTER — Ambulatory Visit: Payer: Medicare Other | Admitting: Orthopaedic Surgery

## 2020-11-27 DIAGNOSIS — Z20822 Contact with and (suspected) exposure to covid-19: Secondary | ICD-10-CM | POA: Diagnosis not present

## 2020-11-27 LAB — METHYLMALONIC ACID, SERUM: Methylmalonic Acid, Quantitative: 603 nmol/L — ABNORMAL HIGH (ref 0–378)

## 2020-11-28 DIAGNOSIS — N186 End stage renal disease: Secondary | ICD-10-CM | POA: Diagnosis not present

## 2020-11-28 DIAGNOSIS — D509 Iron deficiency anemia, unspecified: Secondary | ICD-10-CM | POA: Diagnosis not present

## 2020-11-28 DIAGNOSIS — Z992 Dependence on renal dialysis: Secondary | ICD-10-CM | POA: Diagnosis not present

## 2020-11-28 DIAGNOSIS — N2581 Secondary hyperparathyroidism of renal origin: Secondary | ICD-10-CM | POA: Diagnosis not present

## 2020-11-28 DIAGNOSIS — D631 Anemia in chronic kidney disease: Secondary | ICD-10-CM | POA: Diagnosis not present

## 2020-11-28 DIAGNOSIS — E1122 Type 2 diabetes mellitus with diabetic chronic kidney disease: Secondary | ICD-10-CM | POA: Diagnosis not present

## 2020-11-30 ENCOUNTER — Encounter: Payer: Self-pay | Admitting: Nurse Practitioner

## 2020-11-30 ENCOUNTER — Telehealth: Payer: Self-pay

## 2020-11-30 ENCOUNTER — Telehealth (INDEPENDENT_AMBULATORY_CARE_PROVIDER_SITE_OTHER): Payer: Medicare Other | Admitting: Nurse Practitioner

## 2020-11-30 ENCOUNTER — Telehealth: Payer: Self-pay | Admitting: *Deleted

## 2020-11-30 ENCOUNTER — Other Ambulatory Visit: Payer: Self-pay

## 2020-11-30 DIAGNOSIS — D62 Acute posthemorrhagic anemia: Secondary | ICD-10-CM

## 2020-11-30 DIAGNOSIS — U071 COVID-19: Secondary | ICD-10-CM | POA: Diagnosis not present

## 2020-11-30 DIAGNOSIS — N186 End stage renal disease: Secondary | ICD-10-CM | POA: Diagnosis not present

## 2020-11-30 DIAGNOSIS — Z992 Dependence on renal dialysis: Secondary | ICD-10-CM

## 2020-11-30 MED ORDER — FOLIC ACID 1 MG PO TABS: 1.0000 mg | ORAL_TABLET | Freq: Every day | ORAL | 0 refills | Status: DC

## 2020-11-30 NOTE — Progress Notes (Signed)
Careteam: Patient Care Team: [name], NP as PCP - General (Geriatric Medicine) [name], MD as PCP - Infectious Diseases (Infectious Diseases) [name], MD as PCP - Cardiology (Cardiology) [name], MD as Consulting Physician (Ophthalmology) [name], MD as Consulting Physician (Cardiology) [name], MD as Consulting Physician (Infectious Diseases) Gardiner Barefoot, DPM as Consulting Physician (Podiatry) [name], Nelda Bucks, NP as Nurse Practitioner (Family Medicine) [place], [name]  Advanced Directive information    Allergies  Allergen Reactions  . Augmentin [Amoxicillin-Pot Clavulanate] Diarrhea    Severe diarrhea  . Mucinex Fast-Max Other (See Comments)    Intensive sweating   . Amphetamines Other (See Comments)    Unknown reaction    Chief Complaint  Patient presents with  . Acute Visit    Patient requested visit to discuss medications and other questions.      HPI: Patient is a 70 y.o. male due to medication questions.  On 2/1 he went to the hospital due to back stools. Pt with hx of CAD with history of CABG, also drug-eluting stent in February 2021, chronic combined CHF with EF of 40-45% in December 2021, ESRD on HD, recurrent upper GI bleeds requiring blood transfusion, smoker, drinks EtOH but had not had anything in the last month,His hemoglobin was found to be in the 4 range and was given 2 units of red blood cells. Incidentally he was also Covid positive but had no symptoms. Now having headaches.   During hospitalization GI consulted and plans were in place to get an EGD on 2/3 however 2/2 in the evening patient decided to leave AMA.   Continues to go to dialysis Tuesday, Thursday and Saturday. Goes late and goes in the back door due COVID    Review of Systems:  Review of Systems  Constitutional: Positive for chills and malaise/fatigue. Negative for fever.  HENT: Negative for congestion.   Respiratory: Negative for  cough, sputum production and shortness of breath.   Gastrointestinal: Negative for abdominal pain and constipation.  Musculoskeletal: Positive for neck pain.  Neurological: Positive for weakness and headaches. Negative for dizziness and tingling.  Psychiatric/Behavioral: Positive for depression.    Past Medical History:  Diagnosis Date  . Acute respiratory failure ([place]) 03/01/2018  . Arthritis    "all over; mostly knees and back" (02/28/2018)  . Chronic lower back pain    stenosis  . Community acquired pneumonia 09/06/2013  . COPD (chronic obstructive pulmonary disease) ([name])   . Coronary atherosclerosis of native coronary artery    a. 02/2003 s/p CABG x 2 (VG->RI, VG->RPDA; b. 11/2019 PCI: LM nl, LAD 90d, D3 50, RI 100, LCX 100p, OM3 100 - fills via L->L collats from D2/dLAD, RCA 100p, VG->RPDA ok, VG->RI 95 (3.5x48 Synergy XD DES).  . Drug abuse ([place])    hx; tested for cocaine as recently as 2/08. says he is not using drugs now - avoided defib. for this reason   . ESRD (end stage renal disease) ([place])    Hemo M-W-F- Richarda Blade  . Fall at home 10/2020  . GERD (gastroesophageal reflux disease)    takes OTC meds as needed  . GI bleeding    a. 11/2019 EGD: angiodysplastic lesions w/ bleeding s/p argon plasma/clipping/epi inj.  . Glaucoma    uses eye drops daily  . Hepatitis B 1968   "tx'd w/isolation; caught it from toilet stools in gym"  . HFrEF (heart failure with reduced ejection fraction) ([place])    a. 01/2019  Echo: EF 40-45%, diffuse HK, mild basal septal hypertrophy.  . History of blood transfusion 03/01/2019  . History of colon polyps    benign  . History of gout    takes Allopurinol daily as well as Colchicine-if needed (02/28/2018)  . History of kidney stones   . HTN (hypertension)    takes Coreg,Imdur.and Apresoline daily  . Human immunodeficiency virus (HIV) disease ([place]) dx'd 1995   takes Genvoya daily  . Hyperlipidemia    takes Atorvastatin daily  . Ischemic cardiomyopathy     a. 01/2019 Echo: EF 40-45%, diffuse HK, mild basal septal hypertrophy. Diast dysfxn. Nl RV size/fxn. Sev dil LA. Triv MR/TR/PR.  [name] Muscle spasm    takes Zanaflex as needed  . Myocardial infarction ([place]) ~ 2004/2005  . Nocturia   . Peripheral neuropathy    takes gabapentin daily  . Pneumonia    "at least twice" (02/28/2018)  . Shortness of breath dyspnea    rarely but if notices it then with exertion  . Syphilis, unspecified   . Type II diabetes mellitus ([place]) 2004   Lantus daily.Average fasting blood sugar 125-199  . Wears glasses   . Wears partial dentures    Past Surgical History:  Procedure Laterality Date  . AV FISTULA PLACEMENT Left 08/02/2018   Procedure: ARTERIOVENOUS (AV) FISTULA CREATION  left arm radiocephlic;  Surgeon: Marty Heck, MD;  Location: [place];  Service: Vascular;  Laterality: Left;  . AV FISTULA PLACEMENT Left 08/01/2019   Procedure: LEFT BRACHIOCEPHALIC ARTERIOVENOUS (AV) FISTULA CREATION;  Surgeon: [name], MD;  Location: [facility];  Service: Vascular;  Laterality: Left;  . BASCILIC VEIN TRANSPOSITION Left 10/03/2019   Procedure: BASILIC VEIN TRANSPOSITION LEFT SECOND STAGE;  Surgeon: [name], MD;  Location: [facility];  Service: Vascular;  Laterality: Left;  . CARDIAC CATHETERIZATION  10/2002; 12/19/2004   [name] 03/08/2011  . COLONOSCOPY  2013   Dr.John Henrene Pastor   . CORONARY ARTERY BYPASS GRAFT  02/24/2003   CABG X2/notes 03/08/2011  . CORONARY STENT INTERVENTION N/A 12/19/2019   Procedure: CORONARY STENT INTERVENTION;  Surgeon: Jettie Booze, MD;  Location: [facility];  Service: Cardiovascular;  Laterality: N/A;  . ESOPHAGOGASTRODUODENOSCOPY (EGD) WITH PROPOFOL N/A 02/08/2019   Procedure: ESOPHAGOGASTRODUODENOSCOPY (EGD) WITH PROPOFOL;  Surgeon: Milus Banister, MD;  Location: [facility];  Service: Gastroenterology;  Laterality: N/A;  . ESOPHAGOGASTRODUODENOSCOPY (EGD) WITH PROPOFOL N/A 12/22/2019   Procedure:  ESOPHAGOGASTRODUODENOSCOPY (EGD) WITH PROPOFOL;  Surgeon: Lavena Bullion, DO;  Location: [facility];  Service: Gastroenterology;  Laterality: N/A;  . ESOPHAGOGASTRODUODENOSCOPY (EGD) WITH PROPOFOL N/A 10/19/2020   Procedure: ESOPHAGOGASTRODUODENOSCOPY (EGD) WITH PROPOFOL;  Surgeon: Jackquline Denmark, MD;  Location: [facility] ENDOSCOPY;  Service: Endoscopy;  Laterality: N/A;  . HEMOSTASIS CLIP PLACEMENT  12/22/2019   Procedure: HEMOSTASIS CLIP PLACEMENT;  Surgeon: Lavena Bullion, DO;  Location: [place];  Service: Gastroenterology;;  . HOT HEMOSTASIS N/A 02/08/2019   Procedure: HOT HEMOSTASIS (ARGON PLASMA COAGULATION/BICAP);  Surgeon: Milus Banister, MD;  Location: [facility] ENDOSCOPY;  Service: Gastroenterology;  Laterality: N/A;  . HOT HEMOSTASIS N/A 12/22/2019   Procedure: HOT HEMOSTASIS (ARGON PLASMA COAGULATION/BICAP);  Surgeon: Lavena Bullion, DO;  Location: [facility] ENDOSCOPY;  Service: Gastroenterology;  Laterality: N/A;  . HOT HEMOSTASIS N/A 10/19/2020   Procedure: HOT HEMOSTASIS (ARGON PLASMA COAGULATION/BICAP);  Surgeon: Jackquline Denmark, MD;  [facility];  Service: Endoscopy;  Laterality: N/A;  . INTERTROCHANTERIC HIP FRACTURE SURGERY Left 11/2006   [name] 03/08/2011  . INTRAVASCULAR  ULTRASOUND/IVUS N/A 12/19/2019   Procedure: Intravascular Ultrasound/IVUS;  Surgeon: Jettie Booze, MD;  Location: [place] [facility];  Service: Cardiovascular;  Laterality: N/A;  . IR FLUORO GUIDE CV LINE RIGHT  07/24/2019  . IR FLUORO GUIDE CV LINE RIGHT  07/30/2019  . IR US GUIDE VASC ACCESS RIGHT  07/24/2019  . IR US GUIDE VASC ACCESS RIGHT  07/30/2019  . LAPAROSCOPIC CHOLECYSTECTOMY  05/2006  . LIGATION OF COMPETING BRANCHES OF ARTERIOVENOUS FISTULA Left 11/05/2018   Procedure: LIGATION OF COMPETING BRANCHES OF ARTERIOVENOUS FISTULA  LEFT  ARM;  Surgeon: Marty Heck, MD;  Location: [facility];  Service: Vascular;  Laterality: Left;  . LUMBAR LAMINECTOMY/DECOMPRESSION MICRODISCECTOMY N/A  02/29/2016   Procedure: Left L4-5 Lateral Recess Decompression, Removal Extradural Intraspinal Facet Cyst;  Surgeon: Marybelle Killings, MD;  Location: [place];  Service: Orthopedics;  Laterality: N/A;  . MULTIPLE TOOTH EXTRACTIONS    . ORIF MANDIBULAR FRACTURE Left 08/13/2004   ORIF of left body fracture mandible with KLS Martin 2.3-mm six hole/notes 03/08/2011  . RIGHT/LEFT HEART CATH AND CORONARY/GRAFT ANGIOGRAPHY N/A 12/19/2019   Procedure: RIGHT/LEFT HEART CATH AND CORONARY/GRAFT ANGIOGRAPHY;  Surgeon: Jettie Booze, MD;  Location: [place] [facility];  Service: Cardiovascular;  Laterality: N/A;  . SCLEROTHERAPY  12/22/2019   Procedure: SCLEROTHERAPY;  Surgeon: Lavena Bullion, DO;  Location: [facility] ENDOSCOPY;  Service: Gastroenterology;;   Social History:   reports that he has been smoking cigarettes. He has a 21.50 pack-year smoking history. He has never used smokeless tobacco. He reports previous alcohol use of about 12.0 standard drinks of alcohol per week. He reports previous drug use. Drug: Cocaine.  Family History  Problem Relation Age of Onset  . Heart failure Father   . Hypertension Father   . Diabetes Brother   . Heart attack Brother   . Alzheimer's disease Mother   . Stroke Sister   . Diabetes Sister   . Alzheimer's disease Sister   . Hypertension Brother   . Diabetes Brother   . Drug abuse Brother   . Colon cancer Neg Hx     Medications: Patient's Medications  New Prescriptions   No medications on file  Previous Medications   ACETAMINOPHEN (TYLENOL) 650 MG CR TABLET    Take 650 mg by mouth 3 (three) times daily.   ALLOPURINOL (ZYLOPRIM) 100 MG TABLET    TAKE 1 TABLET(100 MG) BY MOUTH DAILY   ANORO ELLIPTA 62.5-25 MCG/INH AEPB    INHALE 1 PUFF BY MOUTH EVERY DAY   B-D UF III MINI PEN NEEDLES 31G X 5 MM MISC    USE FOUR TIMES DAILY   BICTEGRAVIR-EMTRICITABINE-TENOFOVIR AF (BIKTARVY) 50-200-25 MG TABS TABLET    Take 1 tablet by mouth daily.   CARVEDILOL (COREG) 3.125 MG  TABLET    Take 1 tablet (3.125 mg total) by mouth 2 (two) times daily.   CINACALCET HCL (SENSIPAR PO)    Take by mouth.    COLCHICINE 0.6 MG TABLET    Take 0.6-1.2 mg by mouth daily as needed (gout flare-up).   DICLOFENAC SODIUM (VOLTAREN) 1 % GEL    APPLY 2 GRAMS TO THE AFFECTED AREA THREE TIMES DAILY AS NEEDED FOR PAIN   FOLIC ACID (FOLVITE) 1 MG TABLET    Take 1 tablet (1 mg total) by mouth daily.   GABAPENTIN (NEURONTIN) 300 MG CAPSULE    Take 1 capsule (300 mg total) by mouth 2 (two) times daily.   INSULIN LISPRO (HUMALOG KWIKPEN) 100  UNIT/ML KWIKPEN    Inject 0.1ml (20 Units total) into the skin 3 (three) times daily as needed for high blood sugar (above 150)   IRON SUCROSE IN SODIUM CHLORIDE 0.9 % 100 ML    Iron Sucrose (Venofer)   ISOSORBIDE MONONITRATE (IMDUR) 30 MG 24 HR TABLET    TAKE 1 TABLET BY MOUTH EVERY DAY   LANCETS (ONETOUCH DELICA PLUS [id number]) MISC    Inject 1 Device as directed 3 (three) times daily. Dx: E11.40   LATANOPROST (XALATAN) 0.005 % OPHTHALMIC SOLUTION    Place 1 drop into both eyes at bedtime.   METHOXY PEG-EPOETIN BETA (MIRCERA IJ)    Mircera   MULTIVITAMIN (RENA-VIT) TABS TABLET    Take 1 tablet by mouth daily.   NITROGLYCERIN (NITROSTAT) 0.3 MG SL TABLET    ONE TABLET UNDER TONGUE AS NEEDED FOR CHEST PAIN   NUTRITIONAL SUPPLEMENTS (VITAMIN D BOOSTER PO)    Take 1 each by mouth 2 (two) times daily.   ONETOUCH ULTRA TEST STRIP    CHECK BLOOD SUGAR 3 TIMES  DAILY   PANTOPRAZOLE (PROTONIX) 40 MG TABLET    TAKE 1 TABLET(40 MG) BY MOUTH TWICE DAILY   POLYETHYLENE GLYCOL (MIRALAX / GLYCOLAX) 17 G PACKET    Take 17 g by mouth daily as needed for mild constipation.   SEVELAMER CARBONATE (RENVELA) 800 MG TABLET    Take 800 mg by mouth 3 (three) times daily with meals.   TICAGRELOR (BRILINTA) 90 MG TABS TABLET    Take 90 mg by mouth 2 (two) times daily.   TIMOLOL (TIMOPTIC) 0.5 % OPHTHALMIC SOLUTION    Place 1 drop into both eyes daily.   TRAMADOL (ULTRAM) 50 MG TABLET     Take 1 tablet (50 mg total) by mouth 2 (two) times daily as needed.  Modified Medications   No medications on file  Discontinued Medications   OXYCODONE-ACETAMINOPHEN (PERCOCET/ROXICET) 5-325 MG TABLET    Take 1 tablet by mouth every 8 (eight) hours as needed for severe pain.   THIAMINE 100 MG TABLET    Take 1 tablet (100 mg total) by mouth daily.    Physical Exam:  There were no vitals filed for this visit. There is no height or weight on file to calculate BMI. Wt Readings from Last 3 Encounters:  11/25/20 166 lb 7.2 oz (75.5 kg)  11/06/20 166 lb 0.1 oz (75.3 kg)  10/20/20 172 lb (78 kg)    Labs reviewed: Basic Metabolic Panel: Recent Labs    02/05/20 0000 03/11/20 1730 10/17/20 0139 10/17/20 1401 10/18/20 0231 10/19/20 0128 11/05/20 0212 11/06/20 0310 11/24/20 1525 11/24/20 1606 11/25/20 0637  NA 141   < >  --  128*   < > 131*   < > 137 141 142 142  K 4.1   < >  --  4.2   < > 4.5   < > 3.7 3.7 3.6 4.1  CL 103   < >  --  93*   < > 95*   < > 94* 99  --  100  CO2  --    < >  --  21*   < > 25   < > 28 24  --  28  GLUCOSE  --    < >  --  296*   < > 89   < > 115* 146*  --  127*  BUN 65*   < >  --  154*   < > 85*   < >  50* 89*  --  105*  CREATININE 5.6*   < > 8.61* 6.76*   < > 7.34*   < > 6.41* 6.13*  --  6.84*  CALCIUM 9.1   < >  --  7.6*   < > 8.0*   < > 8.4* 7.6*  --  7.7*  MG  --   --   --   --    < > 2.1  --  2.3  --   --  2.1  PHOS  --   --   --  5.6*  --   --   --  5.8*  --   --  5.3*  TSH 0.18*  --  0.748  --   --   --   --   --   --   --   --    < > = values in this interval not displayed.   Liver Function Tests: Recent Labs    10/16/20 1807 10/17/20 1401 11/06/20 0310 11/25/20 0637  AST 32  --  19 22  ALT 20  --  13 13  ALKPHOS 80  --  100 73  BILITOT 0.4  --  0.6 1.1  PROT 7.0  --  7.5 6.1*  ALBUMIN 3.3* 2.8* 3.0* 2.4*   Recent Labs    10/16/20 1807  LIPASE 70*   No results for input(s): AMMONIA in the last 8760 hours. CBC: Recent Labs     11/05/20 0212 11/06/20 0310 11/24/20 1525 11/24/20 1606 11/25/20 0637  WBC 7.9 6.3 5.7  --  6.8  NEUTROABS 5.7 3.9 4.9  --   --   HGB 10.4* 9.8* 4.7* 5.1* 7.0*  HCT 33.6* 31.8* 16.1* 15.0* 22.2*  MCV 103.1* 102.9* 107.3*  --  100.5*  PLT 164 172 180  --  171   Lipid Panel: Recent Labs    12/21/19 0352  CHOL 65  HDL 17*  LDLCALC 29  TRIG 95  CHOLHDL 3.8   TSH: Recent Labs    02/05/20 0000 10/17/20 0139  TSH 0.18* 0.748   A1C: Lab Results  Component Value Date   HGBA1C 4.9 11/05/2020     Assessment/Plan 1. COVID-19 virus infection -doing well except having headaches. Tested positive on 2/1, reports diaylsis center will retest him tomorrow. Can use tylenol 650 mg by mouth three times daily as needed for headaches. Denies shortness of breath, chest pains, cough and congestion.   2. Acute blood loss anemia -transfused 2 units, requested labs be sent from HD. Denies any symptoms at this time. Recommend in office follow up at this time.   3. ESRD (end stage renal disease) on dialysis ([place]) Continues on HD now going Tuesday, Thursday and Saturday  Plan to have blood work fwd to office   [place] [facility]> he will arrange this as soon as he can get a ride.   [name]. Harle Battiest  [facility] [phone]    Virtual Visit via telephone  I connected with patient on 11/30/20 at  2:00 PM EST by telephone and verified that I am speaking with the correct person using two identifiers.  Location: Patient: home Provider: office    I discussed the limitations, risks, security and privacy concerns of performing an evaluation and management service by telephone and the availability of in person appointments. I also discussed with the patient that there may be a patient responsible charge related to this service. The patient expressed understanding  and agreed to proceed.   I discussed the assessment and treatment plan with the  patient. The patient was provided an opportunity to ask questions and all were answered. The patient agreed with the plan and demonstrated an understanding of the instructions.   The patient was advised to call back or seek an in-person evaluation if the symptoms worsen or if the condition fails to improve as anticipated.  I provided 18 minutes of non-face-to-face time during this encounter.  [name]. Harle Battiest Avs printed and mailed

## 2020-11-30 NOTE — Progress Notes (Signed)
   This service is provided via telemedicine  No vital signs collected/recorded due to the encounter was a telemedicine visit.   Location of patient (ex: home, work):  Home  Patient consents to a telephone visit: Yes, see telephone visit dated 07/07/2020  Location of the provider (ex: office, home):  [facility], Office   Name of any referring provider:  N/A  Names of all persons participating in the telemedicine service and their role in the encounter:  S.Chrae B/CMA, Sherrie Mustache, NP, and Patient   Time spent on call:  9 min with medical assistant

## 2020-11-30 NOTE — Telephone Encounter (Signed)
Patient called requesting Home Health to come into home.  I explained to patient that he would need a Face to Face visit first in order to do the referral. He agreed but stated that he is in [place] due to Covid. Stated that he will call the office back after his Quarantine and schedule an appointment.   Patient also requested a refill on his Folic Acid he received in the hospital. Pended Rx and sent to [facility] for approval.

## 2020-11-30 NOTE — Telephone Encounter (Signed)
Patient had a telephone visit with [name], NP and following visit [name] asked that I called the dialysis center and ask for a faxed copy labs to be collected on Thursday. Per Patient we need to ask for Rachelle.  I called number provided and asked for Rachelle, who was at lunch at the time of call. A detailed message was taken by a lady in which I provided phone and fax number along with reason for call. The lady plans to give Ernst Bowler the message when she returns.

## 2020-12-01 DIAGNOSIS — D631 Anemia in chronic kidney disease: Secondary | ICD-10-CM | POA: Diagnosis not present

## 2020-12-01 DIAGNOSIS — N2581 Secondary hyperparathyroidism of renal origin: Secondary | ICD-10-CM | POA: Diagnosis not present

## 2020-12-01 DIAGNOSIS — D509 Iron deficiency anemia, unspecified: Secondary | ICD-10-CM | POA: Diagnosis not present

## 2020-12-01 DIAGNOSIS — N186 End stage renal disease: Secondary | ICD-10-CM | POA: Diagnosis not present

## 2020-12-01 DIAGNOSIS — E1122 Type 2 diabetes mellitus with diabetic chronic kidney disease: Secondary | ICD-10-CM | POA: Diagnosis not present

## 2020-12-01 DIAGNOSIS — Z992 Dependence on renal dialysis: Secondary | ICD-10-CM | POA: Diagnosis not present

## 2020-12-01 DIAGNOSIS — U071 COVID-19: Secondary | ICD-10-CM | POA: Diagnosis not present

## 2020-12-02 ENCOUNTER — Ambulatory Visit: Payer: Medicare Other | Admitting: Cardiovascular Disease

## 2020-12-03 DIAGNOSIS — D509 Iron deficiency anemia, unspecified: Secondary | ICD-10-CM | POA: Diagnosis not present

## 2020-12-03 DIAGNOSIS — N186 End stage renal disease: Secondary | ICD-10-CM | POA: Diagnosis not present

## 2020-12-03 DIAGNOSIS — Z992 Dependence on renal dialysis: Secondary | ICD-10-CM | POA: Diagnosis not present

## 2020-12-03 DIAGNOSIS — D631 Anemia in chronic kidney disease: Secondary | ICD-10-CM | POA: Diagnosis not present

## 2020-12-03 DIAGNOSIS — E1122 Type 2 diabetes mellitus with diabetic chronic kidney disease: Secondary | ICD-10-CM | POA: Diagnosis not present

## 2020-12-03 DIAGNOSIS — U071 COVID-19: Secondary | ICD-10-CM | POA: Diagnosis not present

## 2020-12-03 DIAGNOSIS — N2581 Secondary hyperparathyroidism of renal origin: Secondary | ICD-10-CM | POA: Diagnosis not present

## 2020-12-07 DIAGNOSIS — D509 Iron deficiency anemia, unspecified: Secondary | ICD-10-CM | POA: Diagnosis not present

## 2020-12-07 DIAGNOSIS — E1122 Type 2 diabetes mellitus with diabetic chronic kidney disease: Secondary | ICD-10-CM | POA: Diagnosis not present

## 2020-12-07 DIAGNOSIS — Z992 Dependence on renal dialysis: Secondary | ICD-10-CM | POA: Diagnosis not present

## 2020-12-07 DIAGNOSIS — N186 End stage renal disease: Secondary | ICD-10-CM | POA: Diagnosis not present

## 2020-12-07 DIAGNOSIS — N2581 Secondary hyperparathyroidism of renal origin: Secondary | ICD-10-CM | POA: Diagnosis not present

## 2020-12-07 DIAGNOSIS — D631 Anemia in chronic kidney disease: Secondary | ICD-10-CM | POA: Diagnosis not present

## 2020-12-09 ENCOUNTER — Other Ambulatory Visit: Payer: Self-pay

## 2020-12-09 DIAGNOSIS — N2581 Secondary hyperparathyroidism of renal origin: Secondary | ICD-10-CM | POA: Diagnosis not present

## 2020-12-09 DIAGNOSIS — E1122 Type 2 diabetes mellitus with diabetic chronic kidney disease: Secondary | ICD-10-CM | POA: Diagnosis not present

## 2020-12-09 DIAGNOSIS — D509 Iron deficiency anemia, unspecified: Secondary | ICD-10-CM | POA: Diagnosis not present

## 2020-12-09 DIAGNOSIS — Z992 Dependence on renal dialysis: Secondary | ICD-10-CM | POA: Diagnosis not present

## 2020-12-09 DIAGNOSIS — N186 End stage renal disease: Secondary | ICD-10-CM | POA: Diagnosis not present

## 2020-12-09 DIAGNOSIS — D631 Anemia in chronic kidney disease: Secondary | ICD-10-CM | POA: Diagnosis not present

## 2020-12-09 MED ORDER — GABAPENTIN 300 MG PO CAPS: 300.0000 mg | ORAL_CAPSULE | Freq: Every day | ORAL | 1 refills | Status: DC

## 2020-12-09 NOTE — Telephone Encounter (Signed)
Patient request refill. Medication came up with HIGH warning. Routed to Sherrie Mustache, NP for approval.

## 2020-12-10 ENCOUNTER — Ambulatory Visit (INDEPENDENT_AMBULATORY_CARE_PROVIDER_SITE_OTHER): Payer: Medicare Other

## 2020-12-10 ENCOUNTER — Other Ambulatory Visit: Payer: Self-pay

## 2020-12-10 ENCOUNTER — Ambulatory Visit (INDEPENDENT_AMBULATORY_CARE_PROVIDER_SITE_OTHER): Payer: Medicare Other | Admitting: Podiatry

## 2020-12-10 ENCOUNTER — Telehealth: Payer: Self-pay | Admitting: Podiatry

## 2020-12-10 DIAGNOSIS — E0842 Diabetes mellitus due to underlying condition with diabetic polyneuropathy: Secondary | ICD-10-CM | POA: Diagnosis not present

## 2020-12-10 DIAGNOSIS — S92351A Displaced fracture of fifth metatarsal bone, right foot, initial encounter for closed fracture: Secondary | ICD-10-CM

## 2020-12-10 DIAGNOSIS — L84 Corns and callosities: Secondary | ICD-10-CM | POA: Diagnosis not present

## 2020-12-10 NOTE — Telephone Encounter (Signed)
Patient wants to know if he could apply a heat pad to area with pain, Please Advise

## 2020-12-10 NOTE — Telephone Encounter (Signed)
Yes that's fine 

## 2020-12-11 ENCOUNTER — Emergency Department (HOSPITAL_COMMUNITY)
Admission: EM | Admit: 2020-12-11 | Discharge: 2020-12-11 | Disposition: A | Discharge status: Home / Self Care | Payer: Medicare Other | Attending: Emergency Medicine | Admitting: Emergency Medicine

## 2020-12-11 ENCOUNTER — Emergency Department (HOSPITAL_COMMUNITY): Payer: Medicare Other

## 2020-12-11 ENCOUNTER — Other Ambulatory Visit: Payer: Self-pay

## 2020-12-11 DIAGNOSIS — N186 End stage renal disease: Secondary | ICD-10-CM | POA: Insufficient documentation

## 2020-12-11 DIAGNOSIS — I517 Cardiomegaly: Secondary | ICD-10-CM | POA: Diagnosis not present

## 2020-12-11 DIAGNOSIS — Z743 Need for continuous supervision: Secondary | ICD-10-CM | POA: Diagnosis not present

## 2020-12-11 DIAGNOSIS — Z8616 Personal history of COVID-19: Secondary | ICD-10-CM | POA: Insufficient documentation

## 2020-12-11 DIAGNOSIS — J449 Chronic obstructive pulmonary disease, unspecified: Secondary | ICD-10-CM | POA: Diagnosis not present

## 2020-12-11 DIAGNOSIS — F17210 Nicotine dependence, cigarettes, uncomplicated: Secondary | ICD-10-CM | POA: Diagnosis not present

## 2020-12-11 DIAGNOSIS — Z21 Asymptomatic human immunodeficiency virus [HIV] infection status: Secondary | ICD-10-CM | POA: Insufficient documentation

## 2020-12-11 DIAGNOSIS — N2581 Secondary hyperparathyroidism of renal origin: Secondary | ICD-10-CM | POA: Diagnosis not present

## 2020-12-11 DIAGNOSIS — E1142 Type 2 diabetes mellitus with diabetic polyneuropathy: Secondary | ICD-10-CM | POA: Insufficient documentation

## 2020-12-11 DIAGNOSIS — D649 Anemia, unspecified: Secondary | ICD-10-CM

## 2020-12-11 DIAGNOSIS — R7889 Finding of other specified substances, not normally found in blood: Secondary | ICD-10-CM | POA: Diagnosis not present

## 2020-12-11 DIAGNOSIS — I2510 Atherosclerotic heart disease of native coronary artery without angina pectoris: Secondary | ICD-10-CM | POA: Insufficient documentation

## 2020-12-11 DIAGNOSIS — D509 Iron deficiency anemia, unspecified: Secondary | ICD-10-CM | POA: Diagnosis not present

## 2020-12-11 DIAGNOSIS — I120 Hypertensive chronic kidney disease with stage 5 chronic kidney disease or end stage renal disease: Secondary | ICD-10-CM | POA: Diagnosis not present

## 2020-12-11 DIAGNOSIS — D631 Anemia in chronic kidney disease: Secondary | ICD-10-CM | POA: Diagnosis not present

## 2020-12-11 DIAGNOSIS — Z951 Presence of aortocoronary bypass graft: Secondary | ICD-10-CM | POA: Insufficient documentation

## 2020-12-11 DIAGNOSIS — Z794 Long term (current) use of insulin: Secondary | ICD-10-CM | POA: Insufficient documentation

## 2020-12-11 DIAGNOSIS — Z79899 Other long term (current) drug therapy: Secondary | ICD-10-CM | POA: Insufficient documentation

## 2020-12-11 DIAGNOSIS — Z992 Dependence on renal dialysis: Secondary | ICD-10-CM | POA: Insufficient documentation

## 2020-12-11 DIAGNOSIS — R0602 Shortness of breath: Secondary | ICD-10-CM | POA: Diagnosis not present

## 2020-12-11 DIAGNOSIS — E1122 Type 2 diabetes mellitus with diabetic chronic kidney disease: Secondary | ICD-10-CM | POA: Insufficient documentation

## 2020-12-11 DIAGNOSIS — R0600 Dyspnea, unspecified: Secondary | ICD-10-CM | POA: Diagnosis present

## 2020-12-11 LAB — CBC WITH DIFFERENTIAL/PLATELET
Abs Immature Granulocytes: 0.02 10*3/uL (ref 0.00–0.07)
Basophils Absolute: 0 10*3/uL (ref 0.0–0.1)
Basophils Relative: 1 %
Eosinophils Absolute: 0.1 10*3/uL (ref 0.0–0.5)
Eosinophils Relative: 2 %
HCT: 25.4 % — ABNORMAL LOW (ref 39.0–52.0)
Hemoglobin: 7.5 g/dL — ABNORMAL LOW (ref 13.0–17.0)
Immature Granulocytes: 0 %
Lymphocytes Relative: 18 %
Lymphs Abs: 1 10*3/uL (ref 0.7–4.0)
MCH: 30.5 pg (ref 26.0–34.0)
MCHC: 29.5 g/dL — ABNORMAL LOW (ref 30.0–36.0)
MCV: 103.3 fL — ABNORMAL HIGH (ref 80.0–100.0)
Monocytes Absolute: 0.6 10*3/uL (ref 0.1–1.0)
Monocytes Relative: 10 %
Neutro Abs: 3.9 10*3/uL (ref 1.7–7.7)
Neutrophils Relative %: 69 %
Platelets: 208 10*3/uL (ref 150–400)
RBC: 2.46 MIL/uL — ABNORMAL LOW (ref 4.22–5.81)
RDW: 15.8 % — ABNORMAL HIGH (ref 11.5–15.5)
WBC: 5.6 10*3/uL (ref 4.0–10.5)
nRBC: 0 % (ref 0.0–0.2)

## 2020-12-11 LAB — I-STAT CHEM 8, ED
BUN: 27 mg/dL — ABNORMAL HIGH (ref 8–23)
Calcium, Ion: 0.92 mmol/L — ABNORMAL LOW (ref 1.15–1.40)
Chloride: 94 mmol/L — ABNORMAL LOW (ref 98–111)
Creatinine, Ser: 3.2 mg/dL — ABNORMAL HIGH (ref 0.61–1.24)
Glucose, Bld: 133 mg/dL — ABNORMAL HIGH (ref 70–99)
HCT: 25 % — ABNORMAL LOW (ref 39.0–52.0)
Hemoglobin: 8.5 g/dL — ABNORMAL LOW (ref 13.0–17.0)
Potassium: 3.8 mmol/L (ref 3.5–5.1)
Sodium: 138 mmol/L (ref 135–145)
TCO2: 36 mmol/L — ABNORMAL HIGH (ref 22–32)

## 2020-12-11 LAB — COMPREHENSIVE METABOLIC PANEL
ALT: 14 U/L (ref 0–44)
AST: 20 U/L (ref 15–41)
Albumin: 3.4 g/dL — ABNORMAL LOW (ref 3.5–5.0)
Alkaline Phosphatase: 113 U/L (ref 38–126)
Anion gap: 13 (ref 5–15)
BUN: 19 mg/dL (ref 8–23)
CO2: 29 mmol/L (ref 22–32)
Calcium: 8.5 mg/dL — ABNORMAL LOW (ref 8.9–10.3)
Chloride: 93 mmol/L — ABNORMAL LOW (ref 98–111)
Creatinine, Ser: 3.08 mg/dL — ABNORMAL HIGH (ref 0.61–1.24)
GFR, Estimated: 21 mL/min — ABNORMAL LOW (ref >60)
Glucose, Bld: 133 mg/dL — ABNORMAL HIGH (ref 70–99)
Potassium: 3.3 mmol/L — ABNORMAL LOW (ref 3.5–5.1)
Sodium: 135 mmol/L (ref 135–145)
Total Bilirubin: 0.7 mg/dL (ref 0.3–1.2)
Total Protein: 8.1 g/dL (ref 6.5–8.1)

## 2020-12-11 LAB — PREPARE RBC (CROSSMATCH)

## 2020-12-11 LAB — TROPONIN I (HIGH SENSITIVITY): Troponin I (High Sensitivity): 71 ng/L — ABNORMAL HIGH (ref <18)

## 2020-12-11 LAB — POC OCCULT BLOOD, ED: Fecal Occult Bld: POSITIVE — AB

## 2020-12-11 MED ORDER — SODIUM CHLORIDE 0.9% IV SOLUTION: Freq: Once | INTRAVENOUS | Status: AC

## 2020-12-11 NOTE — ED Provider Notes (Signed)
[facility] EMERGENCY DEPARTMENT Provider Note   CSN: [id number] Arrival date & time: 12/11/20  1149     History Chief Complaint  Patient presents with  . Shortness of Breath    SENT FROM DIALYSIS WITH LOW HGB    Jacob [name] is a 70 y.o. male.  HPI      Cramps and dyspnea at dialysis Wednesday Has had dyspnea with walking Was continuing to feel shortness of breath, director at dialysis called EMS to bring him here. Hgb 6.8 No chest pain Dyspnea not worse laying down flat, no leg swelling No cough, no fever No lightheadedness except pinpoint area left No medication changes No black or bloody stools in last 2 weeks Feeling more fatigue Stool has not been black in the past and does have history of GI bleed but no symptoms like that now   Past Medical History:  Diagnosis Date  . Acute respiratory failure ([place]) 03/01/2018  . Arthritis    "all over; mostly knees and back" (02/28/2018)  . Chronic lower back pain    stenosis  . Community acquired pneumonia 09/06/2013  . COPD (chronic obstructive pulmonary disease) ([place])   . Coronary atherosclerosis of native coronary artery    a. 02/2003 s/p CABG x 2 (VG->RI, VG->RPDA; b. 11/2019 PCI: LM nl, LAD 90d, D3 50, RI 100, LCX 100p, OM3 100 - fills via L->L collats from D2/dLAD, RCA 100p, VG->RPDA ok, VG->RI 95 (3.5x48 Synergy XD DES).  . Drug abuse ([place])    hx; tested for cocaine as recently as 2/08. says he is not using drugs now - avoided defib. for this reason   . ESRD (end stage renal disease) ([place])    Hemo M-W-F- Richarda Blade  . Fall at home 10/2020  . GERD (gastroesophageal reflux disease)    takes OTC meds as needed  . GI bleeding    a. 11/2019 EGD: angiodysplastic lesions w/ bleeding s/p argon plasma/clipping/epi inj.  . Glaucoma    uses eye drops daily  . Hepatitis B 1968   "tx'd w/isolation; caught it from toilet stools in gym"  . HFrEF (heart failure with reduced ejection fraction) ([place])    a. 01/2019  Echo: EF 40-45%, diffuse HK, mild basal septal hypertrophy.  . History of blood transfusion 03/01/2019  . History of colon polyps    benign  . History of gout    takes Allopurinol daily as well as Colchicine-if needed (02/28/2018)  . History of kidney stones   . HTN (hypertension)    takes Coreg,Imdur.and Apresoline daily  . Human immunodeficiency virus (HIV) disease ([place]) dx'd 1995   takes Genvoya daily  . Hyperlipidemia    takes Atorvastatin daily  . Ischemic cardiomyopathy    a. 01/2019 Echo: EF 40-45%, diffuse HK, mild basal septal hypertrophy. Diast dysfxn. Nl RV size/fxn. Sev dil LA. Triv MR/TR/PR.  [name] Muscle spasm    takes Zanaflex as needed  . Myocardial infarction ([place]) ~ 2004/2005  . Nocturia   . Peripheral neuropathy    takes gabapentin daily  . Pneumonia    "at least twice" (02/28/2018)  . Shortness of breath dyspnea    rarely but if notices it then with exertion  . Syphilis, unspecified   . Type II diabetes mellitus ([place]) 2004   Lantus daily.Average fasting blood sugar 125-199  . Wears glasses   . Wears partial dentures     Patient Active Problem List   Diagnosis Date Noted  . Acute upper GI bleed  11/24/2020  . COVID-19 virus infection 11/24/2020  . Subdural hematoma ([place]) 11/06/2020  . Fall at home, initial encounter 11/05/2020  . Nicotine dependence, cigarettes, uncomplicated 84/66/5993  . Alcohol abuse 11/05/2020  . GERD without esophagitis 11/05/2020  . Unspecified trochanteric fracture of left femur, initial encounter for closed fracture ([place]) 11/05/2020  . Traumatic subdural hematoma, initial encounter ([place]) 11/05/2020  . Fracture of metatarsal of right foot, closed 11/05/2020  . Nondisplaced fracture of greater trochanter of left femur, initial encounter for closed fracture ([place])   . Uremia   . Acute GI bleeding 10/17/2020  . AVM (arteriovenous malformation) of small bowel, acquired with hemorrhage   . Fluid overload 10/16/2020  . Cardiomyopathy ([place])  12/21/2019  . Iron deficiency anemia   . Acute blood loss anemia   . Acute on chronic anemia   . AVM (arteriovenous malformation) of duodenum, acquired   . Abnormal nuclear stress test 12/19/2019  . ESRD on hemodialysis ([place])   . Hand pain, left   . Cocaine abuse ([place])   . Hypertensive emergency   . CHF exacerbation ([place]) 07/21/2019  . Diabetic foot ulcer ([place]) 02/11/2019  . Gastric AVM   . Melena 02/07/2019  . CKD stage 4 due to type 2 diabetes mellitus ([place]) 05/30/2018  . Hyperlipidemia associated with type 2 diabetes mellitus ([place]) 05/30/2018  . Right foot ulcer ([place]) 02/28/2018  . Chronic obstructive pulmonary disease ([place]) 02/28/2018  . Anemia of chronic disease 11/20/2016  . CKD (chronic kidney disease) stage 3, GFR 30-59 ml/min (HCC) 11/20/2016  . CHF (congestive heart failure) ([place]) 11/10/2016  . History of lumbar laminectomy for spinal cord decompression 02/29/2016  . Type 2 diabetes mellitus with diabetic polyneuropathy, with long-term current use of insulin ([place]) 06/17/2015  . HTN (hypertension) 04/26/2015  . DM (diabetes mellitus) ([place]) 04/26/2015  . Ischemic cardiomyopathy 05/12/2014  . Ulcer of lower extremity ([place]) 09/06/2013  . Hyperkalemia 09/06/2013  . Chest pain 09/06/2013  . Type II or unspecified type diabetes mellitus with unspecified complication, not stated as uncontrolled 10/22/2012  . Bunion of left foot 11/25/2011  . Bunion, right foot 11/25/2011  . Polysubstance abuse ([place]) 07/28/2011  . Hip fracture, left ([place]) 04/04/2011  . Closed fracture of neck of femur ([place]) 04/04/2011  . Insomnia 02/18/2011  . Coronary artery disease involving native heart without angina pectoris 04/20/2009  . Chronic combined systolic (congestive) and diastolic (congestive) heart failure ([place]) 04/20/2009  . Mixed hyperlipidemia 11/20/2006  . Gout 11/20/2006  . TOBACCO ABUSE 11/20/2006  . Essential hypertension 11/20/2006  . Human immunodeficiency virus (HIV) disease ([place])  09/02/2006    Past Surgical History:  Procedure Laterality Date  . AV FISTULA PLACEMENT Left 08/02/2018   Procedure: ARTERIOVENOUS (AV) FISTULA CREATION  left arm radiocephlic;  Surgeon: Marty Heck, MD;  Location: [facility];  Service: Vascular;  Laterality: Left;  . AV FISTULA PLACEMENT Left 08/01/2019   Procedure: LEFT BRACHIOCEPHALIC ARTERIOVENOUS (AV) FISTULA CREATION;  Surgeon: [name], MD;  Location: [facility];  Service: Vascular;  Laterality: Left;  . BASCILIC VEIN TRANSPOSITION Left 10/03/2019   Procedure: BASILIC VEIN TRANSPOSITION LEFT SECOND STAGE;  Surgeon: [name], MD;  Location: [facility];  Service: Vascular;  Laterality: Left;  . CARDIAC CATHETERIZATION  10/2002; 12/19/2004   [name] 03/08/2011  . COLONOSCOPY  2013   Dr.John Henrene Pastor   . CORONARY ARTERY BYPASS GRAFT  02/24/2003   CABG X2/notes 03/08/2011  . CORONARY STENT INTERVENTION N/A 12/19/2019   Procedure: CORONARY STENT INTERVENTION;  Surgeon: Jettie Booze, MD;  Location: [facility] CV LAB;  Service: Cardiovascular;  Laterality: N/A;  . ESOPHAGOGASTRODUODENOSCOPY (EGD) WITH PROPOFOL N/A 02/08/2019   Procedure: ESOPHAGOGASTRODUODENOSCOPY (EGD) WITH PROPOFOL;  Surgeon: Milus Banister, MD;  Location: [facility];  Service: Gastroenterology;  Laterality: N/A;  . ESOPHAGOGASTRODUODENOSCOPY (EGD) WITH PROPOFOL N/A 12/22/2019   Procedure: ESOPHAGOGASTRODUODENOSCOPY (EGD) WITH PROPOFOL;  Surgeon: Lavena Bullion, DO;  Location: [facility];  Service: Gastroenterology;  Laterality: N/A;  . ESOPHAGOGASTRODUODENOSCOPY (EGD) WITH PROPOFOL N/A 10/19/2020   Procedure: ESOPHAGOGASTRODUODENOSCOPY (EGD) WITH PROPOFOL;  Surgeon: Jackquline Denmark, MD;  Location: [facility] ENDOSCOPY;  Service: Endoscopy;  Laterality: N/A;  . HEMOSTASIS CLIP PLACEMENT  12/22/2019   Procedure: HEMOSTASIS CLIP PLACEMENT;  Surgeon: Lavena Bullion, DO;  Location: [facility];  Service: Gastroenterology;;  . HOT HEMOSTASIS N/A 02/08/2019   Procedure:  HOT HEMOSTASIS (ARGON PLASMA COAGULATION/BICAP);  Surgeon: Milus Banister, MD;  Location[facility];  Service: Gastroenterology;  Laterality: N/A;  . HOT HEMOSTASIS N/A 12/22/2019   Procedure: HOT HEMOSTASIS (ARGON PLASMA COAGULATION/BICAP);  Surgeon: Lavena Bullion, DO;  Location[facility];  Service: Gastroenterology;  Laterality: N/A;  . HOT HEMOSTASIS N/A 10/19/2020   Procedure: HOT HEMOSTASIS (ARGON PLASMA COAGULATION/BICAP);  Surgeon: Jackquline Denmark, MD;  Location: [facility] ENDOSCOPY;  Service: Endoscopy;  Laterality: N/A;  . INTERTROCHANTERIC HIP FRACTURE SURGERY Left 11/2006   [name] 03/08/2011  . INTRAVASCULAR ULTRASOUND/IVUS N/A 12/19/2019   Procedure: Intravascular Ultrasound/IVUS;  Surgeon: Jettie Booze, MD;  Location: [facility] CV LAB;  Service: Cardiovascular;  Laterality: N/A;  . IR FLUORO GUIDE CV LINE RIGHT  07/24/2019  . IR FLUORO GUIDE CV LINE RIGHT  07/30/2019  . IR US GUIDE VASC ACCESS RIGHT  07/24/2019  . IR US GUIDE VASC ACCESS RIGHT  07/30/2019  . LAPAROSCOPIC CHOLECYSTECTOMY  05/2006  . LIGATION OF COMPETING BRANCHES OF ARTERIOVENOUS FISTULA Left 11/05/2018   Procedure: LIGATION OF COMPETING BRANCHES OF ARTERIOVENOUS FISTULA  LEFT  ARM;  Surgeon: Marty Heck, MD;  Location: [facility];  Service: Vascular;  Laterality: Left;  . LUMBAR LAMINECTOMY/DECOMPRESSION MICRODISCECTOMY N/A 02/29/2016   Procedure: Left L4-5 Lateral Recess Decompression, Removal Extradural Intraspinal Facet Cyst;  Surgeon: Marybelle Killings, MD;  Location: [employer];  Service: Orthopedics;  Laterality: N/A;  . MULTIPLE TOOTH EXTRACTIONS    . ORIF MANDIBULAR FRACTURE Left 08/13/2004   ORIF of left body fracture mandible with KLS Martin 2.3-mm six hole/notes 03/08/2011  . RIGHT/LEFT HEART CATH AND CORONARY/GRAFT ANGIOGRAPHY N/A 12/19/2019   Procedure: RIGHT/LEFT HEART CATH AND CORONARY/GRAFT ANGIOGRAPHY;  Surgeon: Jettie Booze, MD;  Location: [facility] CV LAB;  Service: Cardiovascular;   Laterality: N/A;  . SCLEROTHERAPY  12/22/2019   Procedure: SCLEROTHERAPY;  Surgeon: Lavena Bullion, DO;  Location: [facility] ENDOSCOPY;  Service: Gastroenterology;;       Family History  Problem Relation Age of Onset  . Heart failure Father   . Hypertension Father   . Diabetes Brother   . Heart attack Brother   . Alzheimer's disease Mother   . Stroke Sister   . Diabetes Sister   . Alzheimer's disease Sister   . Hypertension Brother   . Diabetes Brother   . Drug abuse Brother   . Colon cancer Neg Hx     Social History   Tobacco Use  . Smoking status: Current Every Day Smoker    Packs/day: 0.50    Years: 43.00    Pack years: 21.50    Types: Cigarettes  . Smokeless  tobacco: Never Used  Vaping Use  . Vaping Use: Never used  Substance Use Topics  . Alcohol use: Not Currently    Alcohol/week: 12.0 standard drinks    Types: 12 Standard drinks or equivalent per week  . Drug use: Not Currently    Types: Cocaine    Comment: hx of crack/cocaine 57yrs ago 10/01/2019- none    Home Medications Prior to Admission medications   Medication Sig Start Date End Date Taking? Authorizing Provider  acetaminophen (TYLENOL) 650 MG CR tablet Take 650 mg by mouth 3 (three) times daily.    [provider]  allopurinol (ZYLOPRIM) 100 MG tablet TAKE 1 TABLET(100 MG) BY MOUTH DAILY 07/23/20   Eubanks, [name], NP  ANORO ELLIPTA 62.5-25 MCG/INH AEPB INHALE 1 PUFF BY MOUTH EVERY DAY 12/11/19   [name], Dinah C, NP  B-D UF III MINI PEN NEEDLES 31G X 5 MM MISC USE FOUR TIMES DAILY 03/02/20   [name], NP  bictegravir-emtricitabine-tenofovir AF (BIKTARVY) 50-200-25 MG TABS tablet Take 1 tablet by mouth daily. 06/30/20   [name], MD  carvedilol (COREG) 3.125 MG tablet Take 1 tablet (3.125 mg total) by mouth 2 (two) times daily. 02/13/20   [name], MD  Cinacalcet HCl (SENSIPAR PO) Take by mouth.  08/14/20 08/13/21  [provider]  colchicine 0.6 MG tablet Take 0.6-1.2  mg by mouth daily as needed (gout flare-up).    [provider]  diclofenac Sodium (VOLTAREN) 1 % GEL APPLY 2 GRAMS TO THE AFFECTED AREA THREE TIMES DAILY AS NEEDED FOR PAIN 11/16/20   [name], NP  folic acid (FOLVITE) 1 MG tablet Take 1 tablet (1 mg total) by mouth daily. 11/30/20   [name], NP  gabapentin (NEURONTIN) 300 MG capsule Take 1 capsule (300 mg total) by mouth daily. Dose change due to renal function 12/09/20   [name], NP  insulin lispro (HUMALOG KWIKPEN) 100 UNIT/ML KwikPen Inject 0.14ml (20 Units total) into the skin 3 (three) times daily as needed for high blood sugar (above 150) Patient taking differently: Inject 0.44ml (20 Units total) into the skin 3 (three) times daily as needed for high blood sugar (above 150) 03/12/20   [name], NP  iron sucrose in sodium chloride 0.9 % 100 mL Iron Sucrose (Venofer) 07/08/20 07/07/21  [provider]  isosorbide mononitrate (IMDUR) 30 MG 24 hr tablet TAKE 1 TABLET BY MOUTH EVERY DAY 08/10/20   [name], NP  Lancets (ONETOUCH DELICA PLUS [id number]) MISC Inject 1 Device as directed 3 (three) times daily. Dx: E11.40 09/18/19   [name], NP  latanoprost (XALATAN) 0.005 % ophthalmic solution Place 1 drop into both eyes at bedtime.    [provider]  Methoxy PEG-Epoetin Beta (MIRCERA IJ) Mircera 07/06/20   [provider]  multivitamin (RENA-VIT) TABS tablet Take 1 tablet by mouth daily.    [provider]  nitroGLYCERIN (NITROSTAT) 0.3 MG SL tablet ONE TABLET UNDER TONGUE AS NEEDED FOR CHEST PAIN 05/01/19   [name], MD  Nutritional Supplements (VITAMIN D BOOSTER PO) Take 1 each by mouth 2 (two) times daily. 08/12/20 08/11/21  [provider]  ONETOUCH ULTRA test strip CHECK BLOOD SUGAR 3 TIMES  DAILY 05/27/20   [name], NP  pantoprazole (PROTONIX) 40 MG tablet TAKE 1 TABLET(40 MG) BY MOUTH TWICE DAILY 10/19/20   Fayrene Helper, MD  polyethylene glycol (MIRALAX / GLYCOLAX) 17 g packet Take  17 g by mouth daily as needed for mild constipation. 11/07/20   Aline August, MD  sevelamer carbonate (RENVELA) 800 MG tablet Take 800 mg by mouth 3 (three) times daily with meals.    [provider]  ticagrelor (BRILINTA) 90 MG TABS tablet Take 90 mg by mouth 2 (two) times daily.    [provider]  timolol (TIMOPTIC) 0.5 % ophthalmic solution Place 1 drop into both eyes daily. 04/04/19   [provider]  traMADol (ULTRAM) 50 MG tablet Take 1 tablet (50 mg total) by mouth 2 (two) times daily as needed. 11/20/20   Marybelle Killings, MD    Allergies    Augmentin [amoxicillin-pot clavulanate], Mucinex fast-max, and Amphetamines  Review of Systems   Review of Systems  Constitutional: Positive for fatigue. Negative for fever.  HENT: Negative for sore throat.   Eyes: Negative for visual disturbance.  Respiratory: Positive for shortness of breath. Negative for cough.   Cardiovascular: Negative for chest pain.  Gastrointestinal: Negative for abdominal pain, blood in stool, diarrhea, nausea and vomiting.  Genitourinary: Negative for difficulty urinating (still making urine).  Musculoskeletal: Negative for back pain and neck stiffness.  Skin: Negative for rash.  Neurological: Positive for light-headedness (if sitting to standing). Negative for syncope and headaches.    Physical Exam Updated Vital Signs BP 133/65   Pulse 96   Temp 98.3 F (36.8 C) (Oral)   Resp 20   Ht 6\' 2"  (1.88 m)   SpO2 100%   BMI 21.37 kg/m   Physical Exam Vitals and nursing note reviewed.  Constitutional:      General: He is not in acute distress.    Appearance: He is well-developed and well-nourished. He is not diaphoretic.  HENT:     Head: Normocephalic and atraumatic.  Eyes:     Extraocular Movements: EOM normal.     Conjunctiva/sclera: Conjunctivae normal.  Cardiovascular:     Rate and Rhythm: Normal rate and regular  rhythm.     Pulses: Intact distal pulses.     Heart sounds: Normal heart sounds. No murmur heard. No friction rub. No gallop.   Pulmonary:     Effort: Pulmonary effort is normal. No respiratory distress.     Breath sounds: Normal breath sounds. No wheezing or rales.  Abdominal:     General: There is no distension.     Palpations: Abdomen is soft.     Tenderness: There is no abdominal tenderness. There is no guarding.  Musculoskeletal:        General: No edema.     Cervical back: Normal range of motion.  Skin:    General: Skin is warm and dry.  Neurological:     Mental Status: He is alert and oriented to person, place, and time.     ED Results / Procedures / Treatments   Labs (all labs ordered are listed, but only abnormal results are displayed) Labs Reviewed  CBC WITH DIFFERENTIAL/PLATELET - Abnormal; Notable for the following components:      Result Value   RBC 2.46 (*)    Hemoglobin 7.5 (*)    HCT 25.4 (*)    MCV 103.3 (*)    MCHC 29.5 (*)    RDW 15.8 (*)    All other components within normal limits  COMPREHENSIVE METABOLIC PANEL - Abnormal; Notable for the following components:   Potassium 3.3 (*)    Chloride 93 (*)    Glucose, Bld 133 (*)    Creatinine, Ser 3.08 (*)  Calcium 8.5 (*)    Albumin 3.4 (*)    GFR, Estimated 21 (*)    All other components within normal limits  I-STAT CHEM 8, ED - Abnormal; Notable for the following components:   Chloride 94 (*)    BUN 27 (*)    Creatinine, Ser 3.20 (*)    Glucose, Bld 133 (*)    Calcium, Ion 0.92 (*)    TCO2 36 (*)    Hemoglobin 8.5 (*)    HCT 25.0 (*)    All other components within normal limits  POC OCCULT BLOOD, ED - Abnormal; Notable for the following components:   Fecal Occult Bld POSITIVE (*)    All other components within normal limits  TROPONIN I (HIGH SENSITIVITY) - Abnormal; Notable for the following components:   Troponin I (High Sensitivity) 71 (*)    All other components within normal limits   TYPE AND SCREEN  PREPARE RBC (CROSSMATCH)  TROPONIN I (HIGH SENSITIVITY)    EKG EKG Interpretation  Date/Time:  Friday December 11 2020 12:02:52 EST Ventricular Rate:  95 PR Interval:    QRS Duration: 141 QT Interval:  459 QTC Calculation: 578 R Axis:   -4 Text Interpretation: Sinus rhythm Multiform ventricular premature complexes Right bundle branch block Abnormal inferior Q waves Probable lateral infarct, old Since prior ECG< rate has increased Confirmed by Gareth Morgan [phone]) on 12/11/2020 2:12:15 PM   Radiology DG Chest Portable 1 View  Result Date: 12/11/2020 CLINICAL DATA:  Shortness of breath EXAM: PORTABLE CHEST 1 VIEW COMPARISON:  11/24/2020 FINDINGS: Cardiomegaly status post median sternotomy and CABG. Both lungs are clear. The visualized skeletal structures are unremarkable. IMPRESSION: Cardiomegaly without acute abnormality of the lungs in AP portable projection. Electronically Signed   By: Eddie Candle M.D.   On: 12/11/2020 13:05   DG Foot Complete Right  Result Date: 12/10/2020 Please see detailed radiograph report in office note.   Procedures .Critical Care Performed by: Gareth Morgan, MD Authorized by: Gareth Morgan, MD   Critical care provider statement:    Critical care time (minutes):  30   Critical care was time spent personally by me on the following activities:  Examination of patient, ordering and performing treatments and interventions, ordering and review of laboratory studies, ordering and review of radiographic studies, pulse oximetry, re-evaluation of patient's condition, obtaining history from patient or surrogate and review of old charts     Medications Ordered in ED Medications  0.9 %  sodium chloride infusion (Manually program via Guardrails IV Fluids) ( Intravenous Stopped 12/11/20 1646)    ED Course  I have reviewed the triage vital signs and the nursing notes.  Pertinent labs & imaging results that were available during my  care of the patient were reviewed by me and considered in my medical decision making (see chart for details).    MDM Rules/Calculators/A&P                          70yo male with history of CAD, CABG, chronic combined CHF with EF 40-45%, ESRD on dialysis MWF, recurrent upper GI bleeds requiring blood transfusion, smoking, HIV, DM, COPD, recent admission 2/1 for GI bleed and found to have asymptomatic COVID infection, presents with concern for symptomatic anemia from dialysis with reported hgb 6.8, dyspnea, fatigue.  Given history, have low suspicion for other etiology of symptoms. CXR does not show signs of pneumonia or pulmonary edema. No signs of arrhythmia, troponin at baseline  70s, no chest pain, doubt ACS.  Has had symptomatic anemia recently and received transfusion for hgb in 7s with improvement. Hgb reportedly 6.8 at dialysis however is 7.5 here. (istat reads 8 but suspect inaccurate.)  He does not have symptoms of GI bleed at this time and he is confident in this.  On rectal exam, he has no signs of acute GI bleeding with light colored stool which is chronically hemoccult positive.  Discussed risks and benefits of transfusion and possible admission for symptomatic anemia however he would like transfusion with outpatient follow up.  Given I do not see any signs of acute GI bleed by patient history or exam (with light brown stool),  feel if he is feeling improved following transfusion without signs of volume overload, it is reasonable for him to follow up as an outpatient. He had dialysis today.  Care signed out to Dr. Ralene Bathe with reevaluation pending.    Final Clinical Impression(s) / ED Diagnoses Final diagnoses:  Symptomatic anemia  ESRD on dialysis [facility])    Rx / DC Orders ED Discharge Orders    None       Gareth Morgan, MD 12/11/20 2138

## 2020-12-11 NOTE — ED Provider Notes (Signed)
Patient care assumed at 1500.  Pt with ESRD on HD here for evaluation of symptomatic anemia from dialysis - received full session today.  Plan to transfuse one unit PRBC and recheck.     Following transfusion pt is feeling improved.  He is able to ambulate without difficulty in the department.  Plan to d/c home without patient follow up and return precautions.    Quintella Reichert, MD 12/11/20 [phone]

## 2020-12-11 NOTE — ED Triage Notes (Signed)
Pt sent from dialysis for SOB and low Hgb. Pt did complete dialysis

## 2020-12-11 NOTE — ED Notes (Signed)
Infusion rate of Blood increased to 175 after 15 minute check. V/S stable, pt given ginger-ale per request.

## 2020-12-12 LAB — BPAM RBC
Blood Product Expiration Date: 202203132359
ISSUE DATE / TIME: 202202181452
Unit Type and Rh: 6200

## 2020-12-12 LAB — TYPE AND SCREEN
ABO/RH(D): A POS
Antibody Screen: NEGATIVE
Unit division: 0

## 2020-12-13 ENCOUNTER — Telehealth: Payer: Self-pay | Admitting: Surgery

## 2020-12-13 ENCOUNTER — Encounter: Payer: Self-pay | Admitting: Podiatry

## 2020-12-13 NOTE — Progress Notes (Signed)
  Subjective:  [name], male    DOB: 1951-05-31,  MRN: [id number]   70 y.o. male returns for follow-up of right foot fracture and large painful callus  Objective:  Physical Exam: warm, good capillary refill, no trophic changes or ulcerative lesions and normal DP and PT pulses.  He has no pain and edema around the distal fifth metatarsal.  There is a large preulcerative callus submet 5 on the right foot  Radiographs: X-ray of the right foot: spiral oblique fracture of distal diaphysis of 5th metatarsal is still visible with bony bridging distally Assessment:   1. Closed non-physeal fracture of fifth metatarsal bone of right foot, initial encounter   2. Pre-ulcerative calluses   3. Diabetes mellitus due to underlying condition with diabetic polyneuropathy, unspecified whether long term insulin use ([place])      Plan:  Patient was evaluated and treated and all questions answered.   -X-ray reviewed with patient.  He may resume regular activity and regular shoes. -Debrided callus on the right foot to prevent ulceration as it was quite large today.  He will return for the remainder of his at risk diabetic foot care with Dr. Elisha Ponder in the future  Return if symptoms worsen or fail to improve.

## 2020-12-13 NOTE — Telephone Encounter (Signed)
ED RNCM received call from patient to clarify his follow up appointment date. RNCM provided clarification no further ED CM needs identified.

## 2020-12-14 ENCOUNTER — Telehealth (INDEPENDENT_AMBULATORY_CARE_PROVIDER_SITE_OTHER): Payer: Medicare Other | Admitting: Nurse Practitioner

## 2020-12-14 ENCOUNTER — Other Ambulatory Visit: Payer: Self-pay

## 2020-12-14 ENCOUNTER — Encounter: Payer: Self-pay | Admitting: Nurse Practitioner

## 2020-12-14 DIAGNOSIS — D631 Anemia in chronic kidney disease: Secondary | ICD-10-CM | POA: Diagnosis not present

## 2020-12-14 DIAGNOSIS — Z992 Dependence on renal dialysis: Secondary | ICD-10-CM | POA: Diagnosis not present

## 2020-12-14 DIAGNOSIS — Z794 Long term (current) use of insulin: Secondary | ICD-10-CM | POA: Diagnosis not present

## 2020-12-14 DIAGNOSIS — N186 End stage renal disease: Secondary | ICD-10-CM | POA: Diagnosis not present

## 2020-12-14 DIAGNOSIS — E1140 Type 2 diabetes mellitus with diabetic neuropathy, unspecified: Secondary | ICD-10-CM | POA: Diagnosis not present

## 2020-12-14 DIAGNOSIS — E1122 Type 2 diabetes mellitus with diabetic chronic kidney disease: Secondary | ICD-10-CM | POA: Diagnosis not present

## 2020-12-14 DIAGNOSIS — D62 Acute posthemorrhagic anemia: Secondary | ICD-10-CM

## 2020-12-14 DIAGNOSIS — D509 Iron deficiency anemia, unspecified: Secondary | ICD-10-CM | POA: Diagnosis not present

## 2020-12-14 DIAGNOSIS — N2581 Secondary hyperparathyroidism of renal origin: Secondary | ICD-10-CM | POA: Diagnosis not present

## 2020-12-14 NOTE — Progress Notes (Signed)
   This service is provided via telemedicine  No vital signs collected/recorded due to the encounter was a telemedicine visit.   Location of patient (ex: home, work):  Home  Patient consents to a telephone visit: Yes, see telephone visit dated 07/07/2020   Location of the provider (ex: office, home):  [facility], Office   Name of any referring provider:  N/A  Names of all persons participating in the telemedicine service and their role in the encounter:  S.Chrae B/CMA, Sherrie Mustache, NP, and Patient   Time spent on call:  9 min with medical assistant

## 2020-12-14 NOTE — Progress Notes (Signed)
Careteam: Patient Care Team: [name], NP as PCP - General (Geriatric Medicine) [name], MD as PCP - Infectious Diseases (Infectious Diseases) [name], MD as PCP - Cardiology (Cardiology) [name], MD as Consulting Physician (Ophthalmology) [name], MD as Consulting Physician (Cardiology) [name], MD as Consulting Physician (Infectious Diseases) Gardiner Barefoot, DPM as Consulting Physician (Podiatry) [name], Nelda Bucks, NP as Nurse Practitioner (Family Medicine) [place], [name]  Advanced Directive information    Allergies  Allergen Reactions  . Augmentin [Amoxicillin-Pot Clavulanate] Diarrhea    Severe diarrhea  . Mucinex Fast-Max Other (See Comments)    Intensive sweating   . Amphetamines Other (See Comments)    Unknown reaction    Chief Complaint  Patient presents with  . Medication Management    Per patient request need to discuss medications via telephone visit. Patient is currently taking gabapentin twice daily and was unaware that [name] changed to once daily. Moderate fall risk.      HPI: Patient is a 70 y.o. male   Having trouble with his access and has to go to vascular center tomorrow.  At dialysis today.  Went back to ED 12/11/20. hgb was low 6.8 he was transfused 1 unit PRBC.    Review of Systems:  Review of Systems  Constitutional: Negative for chills, fever, malaise/fatigue and weight loss.  HENT: Negative for tinnitus.   Respiratory: Negative for cough, sputum production and shortness of breath.   Cardiovascular: Negative for chest pain, palpitations and leg swelling.  Gastrointestinal: Negative for abdominal pain, blood in stool, constipation, diarrhea and heartburn.  Genitourinary:       On HD  Musculoskeletal: Negative for back pain, joint pain and myalgias.  Skin: Negative.   Neurological: Positive for tingling and sensory change. Negative for dizziness and headaches.  Psychiatric/Behavioral:  Negative for depression and memory loss. The patient does not have insomnia.     Past Medical History:  Diagnosis Date  . Acute respiratory failure ([place]) 03/01/2018  . Arthritis    "all over; mostly knees and back" (02/28/2018)  . Chronic lower back pain    stenosis  . Community acquired pneumonia 09/06/2013  . COPD (chronic obstructive pulmonary disease) ([place])   . Coronary atherosclerosis of native coronary artery    a. 02/2003 s/p CABG x 2 (VG->RI, VG->RPDA; b. 11/2019 PCI: LM nl, LAD 90d, D3 50, RI 100, LCX 100p, OM3 100 - fills via L->L collats from D2/dLAD, RCA 100p, VG->RPDA ok, VG->RI 95 (3.5x48 Synergy XD DES).  . Drug abuse ([place])    hx; tested for cocaine as recently as 2/08. says he is not using drugs now - avoided defib. for this reason   . ESRD (end stage renal disease) ([place])    Hemo M-W-F- Richarda Blade  . Fall at home 10/2020  . GERD (gastroesophageal reflux disease)    takes OTC meds as needed  . GI bleeding    a. 11/2019 EGD: angiodysplastic lesions w/ bleeding s/p argon plasma/clipping/epi inj.  . Glaucoma    uses eye drops daily  . Hepatitis B 1968   "tx'd w/isolation; caught it from toilet stools in gym"  . HFrEF (heart failure with reduced ejection fraction) ([place])    a. 01/2019 Echo: EF 40-45%, diffuse HK, mild basal septal hypertrophy.  . History of blood transfusion 03/01/2019  . History of colon polyps    benign  . History of gout    takes Allopurinol daily as well as Colchicine-if needed (02/28/2018)  .  History of kidney stones   . HTN (hypertension)    takes Coreg,Imdur.and Apresoline daily  . Human immunodeficiency virus (HIV) disease ([place]) dx'd 1995   takes Genvoya daily  . Hyperlipidemia    takes Atorvastatin daily  . Ischemic cardiomyopathy    a. 01/2019 Echo: EF 40-45%, diffuse HK, mild basal septal hypertrophy. Diast dysfxn. Nl RV size/fxn. Sev dil LA. Triv MR/TR/PR.  [name] Muscle spasm    takes Zanaflex as needed  . Myocardial infarction ([place]) ~ 2004/2005  .  Nocturia   . Peripheral neuropathy    takes gabapentin daily  . Pneumonia    "at least twice" (02/28/2018)  . Shortness of breath dyspnea    rarely but if notices it then with exertion  . Syphilis, unspecified   . Type II diabetes mellitus ([place]) 2004   Lantus daily.Average fasting blood sugar 125-199  . Wears glasses   . Wears partial dentures    Past Surgical History:  Procedure Laterality Date  . AV FISTULA PLACEMENT Left 08/02/2018   Procedure: ARTERIOVENOUS (AV) FISTULA CREATION  left arm radiocephlic;  Surgeon: Marty Heck, MD;  Location: [place];  Service: Vascular;  Laterality: Left;  . AV FISTULA PLACEMENT Left 08/01/2019   Procedure: LEFT BRACHIOCEPHALIC ARTERIOVENOUS (AV) FISTULA CREATION;  Surgeon: [name], MD;  Location: [place];  Service: Vascular;  Laterality: Left;  . BASCILIC VEIN TRANSPOSITION Left 10/03/2019   Procedure: BASILIC VEIN TRANSPOSITION LEFT SECOND STAGE;  Surgeon: [name], MD;  Location: [facility];  Service: Vascular;  Laterality: Left;  . CARDIAC CATHETERIZATION  10/2002; 12/19/2004   [name] 03/08/2011  . COLONOSCOPY  2013   Dr.John Henrene Pastor   . CORONARY ARTERY BYPASS GRAFT  02/24/2003   CABG X2/notes 03/08/2011  . CORONARY STENT INTERVENTION N/A 12/19/2019   Procedure: CORONARY STENT INTERVENTION;  Surgeon: Jettie Booze, MD;  Location: [place] [facility];  Service: Cardiovascular;  Laterality: N/A;  . ESOPHAGOGASTRODUODENOSCOPY (EGD) WITH PROPOFOL N/A 02/08/2019   Procedure: ESOPHAGOGASTRODUODENOSCOPY (EGD) WITH PROPOFOL;  Surgeon: Milus Banister, MD;  Location: [facility];  Service: Gastroenterology;  Laterality: N/A;  . ESOPHAGOGASTRODUODENOSCOPY (EGD) WITH PROPOFOL N/A 12/22/2019   Procedure: ESOPHAGOGASTRODUODENOSCOPY (EGD) WITH PROPOFOL;  Surgeon: Lavena Bullion, DO;  Location: [place];  Service: Gastroenterology;  Laterality: N/A;  . ESOPHAGOGASTRODUODENOSCOPY (EGD) WITH PROPOFOL N/A 10/19/2020   Procedure:  ESOPHAGOGASTRODUODENOSCOPY (EGD) WITH PROPOFOL;  Surgeon: Jackquline Denmark, MD;  Location[facility] ENDOSCOPY;  Service: Endoscopy;  Laterality: N/A;  . HEMOSTASIS CLIP PLACEMENT  12/22/2019   Procedure: HEMOSTASIS CLIP PLACEMENT;  Surgeon: Lavena Bullion, DO;  Location: [place];  Service: Gastroenterology;;  . HOT HEMOSTASIS N/A 02/08/2019   Procedure: HOT HEMOSTASIS (ARGON PLASMA COAGULATION/BICAP);  Surgeon: Milus Banister, MD;  Location: [name] ENDOSCOPY;  Service: Gastroenterology;  Laterality: N/A;  . HOT HEMOSTASIS N/A 12/22/2019   Procedure: HOT HEMOSTASIS (ARGON PLASMA COAGULATION/BICAP);  Surgeon: Lavena Bullion, DO;  Location: [facility] ENDOSCOPY;  Service: Gastroenterology;  Laterality: N/A;  . HOT HEMOSTASIS N/A 10/19/2020   Procedure: HOT HEMOSTASIS (ARGON PLASMA COAGULATION/BICAP);  Surgeon: Jackquline Denmark, MD;  Location: [facility] ENDOSCOPY;  Service: Endoscopy;  Laterality: N/A;  . INTERTROCHANTERIC HIP FRACTURE SURGERY Left 11/2006   [name] 03/08/2011  . INTRAVASCULAR ULTRASOUND/IVUS N/A 12/19/2019   Procedure: Intravascular Ultrasound/IVUS;  Surgeon: Jettie Booze, MD;  Location: [place] CV [facility];  Service: Cardiovascular;  Laterality: N/A;  . IR FLUORO GUIDE CV LINE RIGHT  07/24/2019  . IR FLUORO GUIDE CV LINE RIGHT  07/30/2019  . IR US GUIDE VASC ACCESS RIGHT  07/24/2019  . IR US GUIDE VASC ACCESS RIGHT  07/30/2019  . LAPAROSCOPIC CHOLECYSTECTOMY  05/2006  . LIGATION OF COMPETING BRANCHES OF ARTERIOVENOUS FISTULA Left 11/05/2018   Procedure: LIGATION OF COMPETING BRANCHES OF ARTERIOVENOUS FISTULA  LEFT  ARM;  Surgeon: Marty Heck, MD;  Location: [place];  Service: Vascular;  Laterality: Left;  . LUMBAR LAMINECTOMY/DECOMPRESSION MICRODISCECTOMY N/A 02/29/2016   Procedure: Left L4-5 Lateral Recess Decompression, Removal Extradural Intraspinal Facet Cyst;  Surgeon: Marybelle Killings, MD;  Location: [place];  Service: Orthopedics;  Laterality: N/A;  . MULTIPLE TOOTH EXTRACTIONS    . ORIF  MANDIBULAR FRACTURE Left 08/13/2004   ORIF of left body fracture mandible with KLS Martin 2.3-mm six hole/notes 03/08/2011  . RIGHT/LEFT HEART CATH AND CORONARY/GRAFT ANGIOGRAPHY N/A 12/19/2019   Procedure: RIGHT/LEFT HEART CATH AND CORONARY/GRAFT ANGIOGRAPHY;  Surgeon: Jettie Booze, MD;  Location: [place] [facility];  Service: Cardiovascular;  Laterality: N/A;  . SCLEROTHERAPY  12/22/2019   Procedure: SCLEROTHERAPY;  Surgeon: Lavena Bullion, DO;  Location: [facility] ENDOSCOPY;  Service: Gastroenterology;;   Social History:   reports that he has been smoking cigarettes. He has a 21.50 pack-year smoking history. He has never used smokeless tobacco. He reports previous alcohol use of about 12.0 standard drinks of alcohol per week. He reports previous drug use. Drug: Cocaine.  Family History  Problem Relation Age of Onset  . Heart failure Father   . Hypertension Father   . Diabetes Brother   . Heart attack Brother   . Alzheimer's disease Mother   . Stroke Sister   . Diabetes Sister   . Alzheimer's disease Sister   . Hypertension Brother   . Diabetes Brother   . Drug abuse Brother   . Colon cancer Neg Hx     Medications: Patient's Medications  New Prescriptions   No medications on file  Previous Medications   ACETAMINOPHEN (TYLENOL) 650 MG CR TABLET    Take 650 mg by mouth 3 (three) times daily.   ALLOPURINOL (ZYLOPRIM) 100 MG TABLET    TAKE 1 TABLET(100 MG) BY MOUTH DAILY   ANORO ELLIPTA 62.5-25 MCG/INH AEPB    INHALE 1 PUFF BY MOUTH EVERY DAY   B-D UF III MINI PEN NEEDLES 31G X 5 MM MISC    USE FOUR TIMES DAILY   BICTEGRAVIR-EMTRICITABINE-TENOFOVIR AF (BIKTARVY) 50-200-25 MG TABS TABLET    Take 1 tablet by mouth daily.   CARVEDILOL (COREG) 3.125 MG TABLET    Take 1 tablet (3.125 mg total) by mouth 2 (two) times daily.   CINACALCET HCL (SENSIPAR PO)    Take by mouth.    COLCHICINE 0.6 MG TABLET    Take 0.6-1.2 mg by mouth daily as needed (gout flare-up).   DICLOFENAC SODIUM  (VOLTAREN) 1 % GEL    APPLY 2 GRAMS TO THE AFFECTED AREA THREE TIMES DAILY AS NEEDED FOR PAIN   FOLIC ACID (FOLVITE) 1 MG TABLET    Take 1 tablet (1 mg total) by mouth daily.   GABAPENTIN (NEURONTIN) 300 MG CAPSULE    Take 1 capsule (300 mg total) by mouth daily. Dose change due to renal function   INSULIN LISPRO (HUMALOG KWIKPEN) 100 UNIT/ML KWIKPEN    Inject 0.86ml (20 Units total) into the skin 3 (three) times daily as needed for high blood sugar (above 150)   IRON SUCROSE IN SODIUM CHLORIDE 0.9 % 100 ML    Iron Sucrose (Venofer)  ISOSORBIDE MONONITRATE (IMDUR) 30 MG 24 HR TABLET    TAKE 1 TABLET BY MOUTH EVERY DAY   LANCETS (ONETOUCH DELICA PLUS [id number]) MISC    Inject 1 Device as directed 3 (three) times daily. Dx: E11.40   LATANOPROST (XALATAN) 0.005 % OPHTHALMIC SOLUTION    Place 1 drop into both eyes at bedtime.   METHOXY PEG-EPOETIN BETA (MIRCERA IJ)    Mircera   MULTIVITAMIN (RENA-VIT) TABS TABLET    Take 1 tablet by mouth daily.   NITROGLYCERIN (NITROSTAT) 0.3 MG SL TABLET    ONE TABLET UNDER TONGUE AS NEEDED FOR CHEST PAIN   NUTRITIONAL SUPPLEMENTS (VITAMIN D BOOSTER PO)    Take 1 each by mouth 2 (two) times daily.   ONETOUCH ULTRA TEST STRIP    CHECK BLOOD SUGAR 3 TIMES  DAILY   PANTOPRAZOLE (PROTONIX) 40 MG TABLET    TAKE 1 TABLET(40 MG) BY MOUTH TWICE DAILY   POLYETHYLENE GLYCOL (MIRALAX / GLYCOLAX) 17 G PACKET    Take 17 g by mouth daily as needed for mild constipation.   SEVELAMER CARBONATE (RENVELA) 800 MG TABLET    Take 800 mg by mouth 3 (three) times daily with meals.   TICAGRELOR (BRILINTA) 90 MG TABS TABLET    Take 90 mg by mouth 2 (two) times daily.   TIMOLOL (TIMOPTIC) 0.5 % OPHTHALMIC SOLUTION    Place 1 drop into both eyes daily.   TRAMADOL (ULTRAM) 50 MG TABLET    Take 1 tablet (50 mg total) by mouth 2 (two) times daily as needed.  Modified Medications   No medications on file  Discontinued Medications   No medications on file    Physical Exam:  There were no  vitals filed for this visit. There is no height or weight on file to calculate BMI. Wt Readings from Last 3 Encounters:  11/25/20 166 lb 7.2 oz (75.5 kg)  11/06/20 166 lb 0.1 oz (75.3 kg)  10/20/20 172 lb (78 kg)      Labs reviewed: Basic Metabolic Panel: Recent Labs    02/05/20 0000 03/11/20 1730 10/17/20 0139 10/17/20 1401 10/18/20 0231 10/19/20 0128 11/05/20 0212 11/06/20 0310 11/24/20 1525 11/24/20 1606 11/25/20 0637 12/11/20 1239 12/11/20 1317  NA 141   < >  --  128*   < > 131*   < > 137 141   < > 142 135 138  K 4.1   < >  --  4.2   < > 4.5   < > 3.7 3.7   < > 4.1 3.3* 3.8  CL 103   < >  --  93*   < > 95*   < > 94* 99  --  100 93* 94*  CO2  --    < >  --  21*   < > 25   < > 28 24  --  28 29  --   GLUCOSE  --    < >  --  296*   < > 89   < > 115* 146*  --  127* 133* 133*  BUN 65*   < >  --  154*   < > 85*   < > 50* 89*  --  105* 19 27*  CREATININE 5.6*   < > 8.61* 6.76*   < > 7.34*   < > 6.41* 6.13*  --  6.84* 3.08* 3.20*  CALCIUM 9.1   < >  --  7.6*   < > 8.0*   < >  8.4* 7.6*  --  7.7* 8.5*  --   MG  --   --   --   --    < > 2.1  --  2.3  --   --  2.1  --   --   PHOS  --   --   --  5.6*  --   --   --  5.8*  --   --  5.3*  --   --   TSH 0.18*  --  0.748  --   --   --   --   --   --   --   --   --   --    < > = values in this interval not displayed.   Liver Function Tests: Recent Labs    11/06/20 0310 11/25/20 0637 12/11/20 1239  AST 19 22 20   ALT 13 13 14   ALKPHOS 100 73 113  BILITOT 0.6 1.1 0.7  PROT 7.5 6.1* 8.1  ALBUMIN 3.0* 2.4* 3.4*   Recent Labs    10/16/20 1807  LIPASE 70*   No results for input(s): AMMONIA in the last 8760 hours. CBC: Recent Labs    11/06/20 0310 11/24/20 1525 11/24/20 1606 11/25/20 0637 12/11/20 1239 12/11/20 1317  WBC 6.3 5.7  --  6.8 5.6  --   NEUTROABS 3.9 4.9  --   --  3.9  --   HGB 9.8* 4.7*   < > 7.0* 7.5* 8.5*  HCT 31.8* 16.1*   < > 22.2* 25.4* 25.0*  MCV 102.9* 107.3*  --  100.5* 103.3*  --   PLT 172 180  --   171 208  --    < > = values in this interval not displayed.   Lipid Panel: Recent Labs    12/21/19 0352  CHOL 65  HDL 17*  LDLCALC 29  TRIG 95  CHOLHDL 3.8   TSH: Recent Labs    02/05/20 0000 10/17/20 0139  TSH 0.18* 0.748   A1C: Lab Results  Component Value Date   HGBA1C 4.9 11/05/2020     Assessment/Plan 1. Acute blood loss anemia -went back to ED on 12/11/20 from HD due to worsening shortness of breath, received 1 unit PRBC and symptoms have improved, he has not followed up with GI, wants a new referral at this time.  - Ambulatory referral to Gastroenterology  2. Type 2 diabetes mellitus with diabetic neuropathy, with long-term current use of insulin (HCC) A1c of 4.9 last month. Reports blood sugars have been well controlled at HD. No hypoglycemia reported. Continues on insulin, will need to review blood sugars.  Gabapentin decreased due to renal function to daily.  He will use gabapentin 300 mg by mouth daily at this time.   Next appt: at dialysis today, will follow up in OFFICE tomorrow.  Jacob [name]. Harle Battiest  [facility] [phone]    Virtual Visit via telephone  I connected with patient on 12/14/20 at  1:00 PM EST by telephone and verified that I am speaking with the correct person using two identifiers.  Location: Patient: dialysis Provider: [facility]   I discussed the limitations, risks, security and privacy concerns of performing an evaluation and management service by telephone and the availability of in person appointments. I also discussed with the patient that there may be a patient responsible charge related to this service. The patient expressed understanding and agreed to proceed.   I discussed the assessment and treatment  plan with the patient. The patient was provided an opportunity to ask questions and all were answered. The patient agreed with the plan and demonstrated an understanding of the instructions.   The  patient was advised to call back or seek an in-person evaluation if the symptoms worsen or if the condition fails to improve as anticipated.  I provided 16 minutes of non-face-to-face time during this encounter.  Jacob [name]. Harle Battiest Avs printed and mailed

## 2020-12-15 ENCOUNTER — Ambulatory Visit (INDEPENDENT_AMBULATORY_CARE_PROVIDER_SITE_OTHER): Payer: Medicare Other | Admitting: Family

## 2020-12-15 ENCOUNTER — Encounter: Payer: Self-pay | Admitting: Family

## 2020-12-15 ENCOUNTER — Other Ambulatory Visit: Payer: Self-pay

## 2020-12-15 VITALS — BP 116/60 | HR 90 | Temp 97.3°F | Resp 16 | Ht 74.0 in | Wt 177.4 lb

## 2020-12-15 DIAGNOSIS — I10 Essential (primary) hypertension: Secondary | ICD-10-CM | POA: Diagnosis not present

## 2020-12-15 DIAGNOSIS — E1169 Type 2 diabetes mellitus with other specified complication: Secondary | ICD-10-CM

## 2020-12-15 DIAGNOSIS — M109 Gout, unspecified: Secondary | ICD-10-CM

## 2020-12-15 DIAGNOSIS — K5901 Slow transit constipation: Secondary | ICD-10-CM | POA: Diagnosis not present

## 2020-12-15 DIAGNOSIS — I5042 Chronic combined systolic (congestive) and diastolic (congestive) heart failure: Secondary | ICD-10-CM

## 2020-12-15 DIAGNOSIS — M25551 Pain in right hip: Secondary | ICD-10-CM | POA: Diagnosis not present

## 2020-12-15 DIAGNOSIS — N186 End stage renal disease: Secondary | ICD-10-CM | POA: Diagnosis not present

## 2020-12-15 DIAGNOSIS — M79671 Pain in right foot: Secondary | ICD-10-CM

## 2020-12-15 DIAGNOSIS — Z794 Long term (current) use of insulin: Secondary | ICD-10-CM

## 2020-12-15 DIAGNOSIS — E1140 Type 2 diabetes mellitus with diabetic neuropathy, unspecified: Secondary | ICD-10-CM

## 2020-12-15 DIAGNOSIS — K219 Gastro-esophageal reflux disease without esophagitis: Secondary | ICD-10-CM

## 2020-12-15 DIAGNOSIS — I25118 Atherosclerotic heart disease of native coronary artery with other forms of angina pectoris: Secondary | ICD-10-CM

## 2020-12-15 DIAGNOSIS — E785 Hyperlipidemia, unspecified: Secondary | ICD-10-CM | POA: Diagnosis not present

## 2020-12-15 DIAGNOSIS — Z992 Dependence on renal dialysis: Secondary | ICD-10-CM

## 2020-12-15 DIAGNOSIS — J449 Chronic obstructive pulmonary disease, unspecified: Secondary | ICD-10-CM | POA: Diagnosis not present

## 2020-12-15 DIAGNOSIS — B20 Human immunodeficiency virus [HIV] disease: Secondary | ICD-10-CM

## 2020-12-15 NOTE — Progress Notes (Signed)
Provider: Braxtyn Bojarski FNP-C   [name], NP  Patient Care Team: [name], NP as PCP - General (Geriatric Medicine) [name], MD as PCP - Infectious Diseases (Infectious Diseases) [name], MD as PCP - Cardiology (Cardiology) [name], MD as Consulting Physician (Ophthalmology) [name], MD as Consulting Physician (Cardiology) [name], MD as Consulting Physician (Infectious Diseases) Gardiner Barefoot, DPM as Consulting Physician (Podiatry) Kaylee Wombles, Nelda Bucks, NP as Nurse Practitioner (Family Medicine) [place], [name]  Extended Emergency Contact Information Primary Emergency Contact: Alda Berthold [place] Phone: [phone] Relation: Sister Secondary Emergency Contact: Domenic Polite Jr.,Willie Address: [address]          [place], [place] [address] [place] Phone: [phone] Mobile Phone: [phone] Relation: Friend  Code Status:  Full Code  Goals of care: Advanced Directive information Advanced Directives 12/15/2020  Does Patient Have a Medical Advance Directive? Yes  Type of Paramedic of [place];Living will;Out of facility DNR (pink MOST or yellow form)  Does patient want to make changes to medical advance directive? No - Patient declined  Copy of [place] in Chart? Yes - validated most recent copy scanned in chart (See row information)  Would patient like information on creating a medical advance directive? -     Chief Complaint  Patient presents with  . Medical Management of Chronic Issues    Routine Visit.   [name] Health Maintenance    Discuss the need for Eye exam.     HPI:  Pt is a 70 y.o. [name] seen today for medical management of chronic diseases.He has a medical history of Hypertension,CAD s/p CABG x 2 in 02/2003,chronic combined CHF with EF 40 -45%,type 2 DM with peripheral Neuropathy,COPD,HIV ,GERD with GI bleed,ESRD on  dialysis on M-W-F , Gout,  Type 2 DM  With Peripheral Neuropathy -  Latest Hgb A1C 4.4 (02/05/2020 )CBG 188 - 200's.currently on Humalog 20 units three times daily if CBG> 150 on Gabapentin 300 mg capsule daily for neuropathy. Follows up with podiatrist at [facility] last seen 12/10/2020.Wears diabetic shoes but states wears clogs at home and when going for dialysis.  Also states has seen Ophthalmologist late last year follows up with Opthalmology [name] at [facility] no records on chart for review.will request records.   CHF - reports shortness of breath with his daily activity of daily living or going up and down the stairs.Request Home health Nurse assist to assist with his ADL's. On Hemodialysis.No signs of fluid overload.   Hypertension - B/p well controlled today.No home readings for review.He denies any headache,dizziness,palpitation or chest pain.Does get shortness of breath getting around doing activities such making his bed or going up and down the stairs.  Gout - on allopurinol 100 mg tablet daily.Has had no recent flare ups.also on colchine 0.6 mg as needed.  COPD - on Anoro Ellipta inhaler daily.He denies any wheezing.Has shortness of breath with activity as above.He denies any cough.He continues to smoke one pack of cigarettes per day.Not ready to quit smoking.     GERD - symptoms under controlled.does not understand why her has to continue on Protonix though he is status post ED 12/11/2020 for symptomatic Anemia Hgb 6.8 was transfused with 1 unit of PRBC.Had no signs of GI bleed.He denies any dark or bloody stool today.Had a Virtual visit with PCP Dani Gobble was referred to Gastroenterology.     CAD -S/p CABG X 2 in  02/2003.on Coreg 3.125 mg tablet twice daily and Imdur 30 mg 24 Hr tablet daily.On Ticagrelor 90 mg tablet twice daily. Also on Nitro as needed.Has not required any Nitro for chest pain. Follows up with Cardiologist Dr.Pter [name] last seen  01/24/2020.Has upcoming appointment.    HIV - on biktarvy follows up with ID   Constipation - uses Metamucil as needed.includes veggies in diet.Reports no blood in the stool or straining.   He needs assistance with making the bed,helping down the steps and assist with picking up his medication.Gets shortness of breath with activity.Has appointment to fill out paperwork to move to another apartment without stairs.  He continues to complain of right hip and right foot pain fell 09/08/2020.He sustained oblique extra-articular displaced minimally angulated fracture of the distal right fifth metatarsal diaphysis.Follows up with [facility] Dr.Gallaway has appointment 3/8/2022on Tramadol 50 mg tablet twice daily as needed.    Past Medical History:  Diagnosis Date  . Acute respiratory failure ([place]) 03/01/2018  . Arthritis    "all over; mostly knees and back" (02/28/2018)  . Chronic lower back pain    stenosis  . Community acquired pneumonia 09/06/2013  . COPD (chronic obstructive pulmonary disease) ([place])   . Coronary atherosclerosis of native coronary artery    a. 02/2003 s/p CABG x 2 (VG->RI, VG->RPDA; b. 11/2019 PCI: LM nl, LAD 90d, D3 50, RI 100, LCX 100p, OM3 100 - fills via L->L collats from D2/dLAD, RCA 100p, VG->RPDA ok, VG->RI 95 (3.5x48 Synergy XD DES).  . Drug abuse ([place])    hx; tested for cocaine as recently as 2/08. says he is not using drugs now - avoided defib. for this reason   . ESRD (end stage renal disease) ([place])    Hemo M-W-F- Richarda Blade  . Fall at home 10/2020  . GERD (gastroesophageal reflux disease)    takes OTC meds as needed  . GI bleeding    a. 11/2019 EGD: angiodysplastic lesions w/ bleeding s/p argon plasma/clipping/epi inj.  . Glaucoma    uses eye drops daily  . Hepatitis B 1968   "tx'd w/isolation; caught it from toilet stools in gym"  . HFrEF (heart failure with reduced ejection fraction) ([place])    a. 01/2019 Echo: EF 40-45%, diffuse HK, mild basal  septal hypertrophy.  . History of blood transfusion 03/01/2019  . History of colon polyps    benign  . History of gout    takes Allopurinol daily as well as Colchicine-if needed (02/28/2018)  . History of kidney stones   . HTN (hypertension)    takes Coreg,Imdur.and Apresoline daily  . Human immunodeficiency virus (HIV) disease ([place]) dx'd 1995   takes Genvoya daily  . Hyperlipidemia    takes Atorvastatin daily  . Ischemic cardiomyopathy    a. 01/2019 Echo: EF 40-45%, diffuse HK, mild basal septal hypertrophy. Diast dysfxn. Nl RV size/fxn. Sev dil LA. Triv MR/TR/PR.  [name] Muscle spasm    takes Zanaflex as needed  . Myocardial infarction ([place]) ~ 2004/2005  . Nocturia   . Peripheral neuropathy    takes gabapentin daily  . Pneumonia    "at least twice" (02/28/2018)  . Shortness of breath dyspnea    rarely but if notices it then with exertion  . Syphilis, unspecified   . Type II diabetes mellitus ([place]) 2004   Lantus daily.Average fasting blood sugar 125-199  . Wears glasses   . Wears partial dentures    Past Surgical History:  Procedure Laterality Date  .  AV FISTULA PLACEMENT Left 08/02/2018   Procedure: ARTERIOVENOUS (AV) FISTULA CREATION  left arm radiocephlic;  Surgeon: Marty Heck, MD;  Location: [place];  Service: Vascular;  Laterality: Left;  . AV FISTULA PLACEMENT Left 08/01/2019   Procedure: LEFT BRACHIOCEPHALIC ARTERIOVENOUS (AV) FISTULA CREATION;  Surgeon: [name], MD;  Location: [place];  Service: Vascular;  Laterality: Left;  . BASCILIC VEIN TRANSPOSITION Left 10/03/2019   Procedure: BASILIC VEIN TRANSPOSITION LEFT SECOND STAGE;  Surgeon: [name], MD;  Location: [place];  Service: Vascular;  Laterality: Left;  . CARDIAC CATHETERIZATION  10/2002; 12/19/2004   [name] 03/08/2011  . COLONOSCOPY  2013   Dr.John Henrene Pastor   . CORONARY ARTERY BYPASS GRAFT  02/24/2003   CABG X2/notes 03/08/2011  . CORONARY STENT INTERVENTION N/A 12/19/2019   Procedure: CORONARY STENT  INTERVENTION;  Surgeon: Jettie Booze, MD;  Location: [place] CV LAB;  Service: Cardiovascular;  Laterality: N/A;  . ESOPHAGOGASTRODUODENOSCOPY (EGD) WITH PROPOFOL N/A 02/08/2019   Procedure: ESOPHAGOGASTRODUODENOSCOPY (EGD) WITH PROPOFOL;  Surgeon: Milus Banister, MD;  Location: [place];  Service: Gastroenterology;  Laterality: N/A;  . ESOPHAGOGASTRODUODENOSCOPY (EGD) WITH PROPOFOL N/A 12/22/2019   Procedure: ESOPHAGOGASTRODUODENOSCOPY (EGD) WITH PROPOFOL;  Surgeon: Lavena Bullion, DO;  Location: [place];  Service: Gastroenterology;  Laterality: N/A;  . ESOPHAGOGASTRODUODENOSCOPY (EGD) WITH PROPOFOL N/A 10/19/2020   Procedure: ESOPHAGOGASTRODUODENOSCOPY (EGD) WITH PROPOFOL;  Surgeon: Jackquline Denmark, MD;  [facility] ENDOSCOPY;  Service: Endoscopy;  Laterality: N/A;  . HEMOSTASIS CLIP PLACEMENT  12/22/2019   Procedure: HEMOSTASIS CLIP PLACEMENT;  Surgeon: Lavena Bullion, DO;  Location: [place];  Service: Gastroenterology;;  . HOT HEMOSTASIS N/A 02/08/2019   Procedure: HOT HEMOSTASIS (ARGON PLASMA COAGULATION/BICAP);  Surgeon: Milus Banister, MD;  Location: [facility] ENDOSCOPY;  Service: Gastroenterology;  Laterality: N/A;  . HOT HEMOSTASIS N/A 12/22/2019   Procedure: HOT HEMOSTASIS (ARGON PLASMA COAGULATION/BICAP);  Surgeon: Lavena Bullion, DO;  Location: [facility] ENDOSCOPY;  Service: Gastroenterology;  Laterality: N/A;  . HOT HEMOSTASIS N/A 10/19/2020   Procedure: HOT HEMOSTASIS (ARGON PLASMA COAGULATION/BICAP);  Surgeon: Jackquline Denmark, MD;  Location: [facility] ENDOSCOPY;  Service: Endoscopy;  Laterality: N/A;  . INTERTROCHANTERIC HIP FRACTURE SURGERY Left 11/2006   [name] 03/08/2011  . INTRAVASCULAR ULTRASOUND/IVUS N/A 12/19/2019   Procedure: Intravascular Ultrasound/IVUS;  Surgeon: Jettie Booze, MD;  Location: [place] CV LAB;  Service: Cardiovascular;  Laterality: N/A;  . IR FLUORO GUIDE CV LINE RIGHT  07/24/2019  . IR FLUORO GUIDE CV LINE RIGHT  07/30/2019  . IR US  GUIDE VASC ACCESS RIGHT  07/24/2019  . IR US GUIDE VASC ACCESS RIGHT  07/30/2019  . LAPAROSCOPIC CHOLECYSTECTOMY  05/2006  . LIGATION OF COMPETING BRANCHES OF ARTERIOVENOUS FISTULA Left 11/05/2018   Procedure: LIGATION OF COMPETING BRANCHES OF ARTERIOVENOUS FISTULA  LEFT  ARM;  Surgeon: Marty Heck, MD;  Location: [place];  Service: Vascular;  Laterality: Left;  . LUMBAR LAMINECTOMY/DECOMPRESSION MICRODISCECTOMY N/A 02/29/2016   Procedure: Left L4-5 Lateral Recess Decompression, Removal Extradural Intraspinal Facet Cyst;  Surgeon: Marybelle Killings, MD;  Location: [place];  Service: Orthopedics;  Laterality: N/A;  . MULTIPLE TOOTH EXTRACTIONS    . ORIF MANDIBULAR FRACTURE Left 08/13/2004   ORIF of left body fracture mandible with KLS Martin 2.3-mm six hole/notes 03/08/2011  . RIGHT/LEFT HEART CATH AND CORONARY/GRAFT ANGIOGRAPHY N/A 12/19/2019   Procedure: RIGHT/LEFT HEART CATH AND CORONARY/GRAFT ANGIOGRAPHY;  Surgeon: Jettie Booze, MD;  Location: [place] CV LAB;  Service: Cardiovascular;  Laterality: N/A;  .  SCLEROTHERAPY  12/22/2019   Procedure: SCLEROTHERAPY;  Surgeon: Lavena Bullion, DO;  Location: [facility] ENDOSCOPY;  Service: Gastroenterology;;    Allergies  Allergen Reactions  . Augmentin [Amoxicillin-Pot Clavulanate] Diarrhea    Severe diarrhea  . Mucinex Fast-Max Other (See Comments)    Intensive sweating   . Amphetamines Other (See Comments)    Unknown reaction    Allergies as of 12/15/2020      Reactions   Augmentin [amoxicillin-pot Clavulanate] Diarrhea   Severe diarrhea   Mucinex Fast-max Other (See Comments)   Intensive sweating    Amphetamines Other (See Comments)   Unknown reaction      Medication List       Accurate as of December 15, 2020 11:59 AM. If you have any questions, ask your nurse or doctor.        acetaminophen 650 MG CR tablet Commonly known as: TYLENOL Take 650 mg by mouth 3 (three) times daily.   allopurinol 100 MG tablet Commonly known  as: ZYLOPRIM TAKE 1 TABLET(100 MG) BY MOUTH DAILY   Anoro Ellipta 62.5-25 MCG/INH Aepb Generic drug: umeclidinium-vilanterol INHALE 1 PUFF BY MOUTH EVERY DAY   B-D UF III MINI PEN NEEDLES 31G X 5 MM Misc Generic drug: Insulin Pen Needle USE FOUR TIMES DAILY   Biktarvy 50-200-25 MG Tabs tablet Generic drug: bictegravir-emtricitabine-tenofovir AF Take 1 tablet by mouth daily.   carvedilol 3.125 MG tablet Commonly known as: COREG Take 1 tablet (3.125 mg total) by mouth 2 (two) times daily.   colchicine 0.6 MG tablet Take 0.6-1.2 mg by mouth daily as needed (gout flare-up).   diclofenac Sodium 1 % Gel Commonly known as: VOLTAREN APPLY 2 GRAMS TO THE AFFECTED AREA THREE TIMES DAILY AS NEEDED FOR PAIN   folic acid 1 MG tablet Commonly known as: FOLVITE Take 1 tablet (1 mg total) by mouth daily.   gabapentin 300 MG capsule Commonly known as: NEURONTIN Take 1 capsule (300 mg total) by mouth daily. Dose change due to renal function   insulin lispro 100 UNIT/ML KwikPen Commonly known as: HumaLOG KwikPen Inject 0.51m (20 Units total) into the skin 3 (three) times daily as needed for high blood sugar (above 150)   iron sucrose in sodium chloride 0.9 % 100 mL Iron Sucrose (Venofer)   isosorbide mononitrate 30 MG 24 hr tablet Commonly known as: IMDUR TAKE 1 TABLET BY MOUTH EVERY DAY   latanoprost 0.005 % ophthalmic solution Commonly known as: XALATAN Place 1 drop into both eyes at bedtime.   MIRCERA IJ Mircera   multivitamin Tabs tablet Take 1 tablet by mouth daily.   nitroGLYCERIN 0.3 MG SL tablet Commonly known as: NITROSTAT ONE TABLET UNDER TONGUE AS NEEDED FOR CHEST PAIN   OneTouch Delica Plus [id number] Inject 1 Device as directed 3 (three) times daily. Dx: E11.40   OneTouch Ultra test strip Generic drug: glucose blood CHECK BLOOD SUGAR 3 TIMES  DAILY   pantoprazole 40 MG tablet Commonly known as: PROTONIX TAKE 1 TABLET(40 MG) BY MOUTH TWICE DAILY    polyethylene glycol 17 g packet Commonly known as: MIRALAX / GLYCOLAX Take 17 g by mouth daily as needed for mild constipation.   SENSIPAR PO Take by mouth.   sevelamer carbonate 800 MG tablet Commonly known as: RENVELA Take 800 mg by mouth 3 (three) times daily with meals.   ticagrelor 90 MG Tabs tablet Commonly known as: BRILINTA Take 90 mg by mouth 2 (two) times daily.   timolol 0.5 % ophthalmic solution  Commonly known as: TIMOPTIC Place 1 drop into both eyes daily.   traMADol 50 MG tablet Commonly known as: ULTRAM Take 1 tablet (50 mg total) by mouth 2 (two) times daily as needed.   VITAMIN D BOOSTER PO Take 1 each by mouth 2 (two) times daily.       Review of Systems  Constitutional: Negative for appetite change, chills, fatigue and fever.  HENT: Negative for congestion, hearing loss, rhinorrhea, sinus pressure, sinus pain, sneezing, sore throat and trouble swallowing.   Eyes: Negative for discharge, redness and itching.  Respiratory: Negative for cough, chest tightness, shortness of breath and wheezing.   Cardiovascular: Negative for chest pain, palpitations and leg swelling.  Gastrointestinal: Negative for abdominal distention, abdominal pain, constipation, diarrhea, nausea and vomiting.  Endocrine: Negative for cold intolerance, heat intolerance, polydipsia, polyphagia and polyuria.  Genitourinary:       On HD three times per week.   Musculoskeletal: Negative for arthralgias, back pain, gait problem, joint swelling and myalgias.  Skin: Negative for color change, pallor and rash.  Neurological: Positive for numbness. Negative for dizziness, speech difficulty, weakness, light-headedness and headaches.  Hematological: Does not bruise/bleed easily.  Psychiatric/Behavioral: Negative for agitation, confusion and sleep disturbance. The patient is not nervous/anxious.     Immunization History  Administered Date(s) Administered  . Fluad Quad(high Dose 65+) 06/14/2019   . Hepatitis B 08/28/2006, 10/02/2007, 04/01/2008  . Hepatitis B, adult 06/03/2014, 07/04/2014  . Influenza Split 07/28/2011  . Influenza Whole 08/28/2006, 09/10/2007, 09/15/2008, 08/03/2009, 07/26/2010  . Influenza, High Dose Seasonal PF 07/04/2018  . Influenza,inj,Quad PF,6+ Mos 07/04/2014, 07/06/2015, 07/12/2016, 07/11/2017  . Influenza-Unspecified 06/24/2013, 06/30/2020  . PFIZER(Purple Top)SARS-COV-2 Vaccination 11/16/2019, 12/07/2019, 07/23/2020  . Pneumococcal Conjugate-13 08/18/2016  . Pneumococcal Polysaccharide-23 08/28/2006, 07/28/2011, 05/30/2018  . Tdap 06/14/2019  . Zoster 06/03/2014  . Zoster Recombinat (Shingrix) 07/24/2017, 01/09/2018   Pertinent  Health Maintenance Due  Topic Date Due  . OPHTHALMOLOGY EXAM  10/07/2020  . LIPID PANEL  12/20/2020  . FOOT EXAM  12/30/2020  . HEMOGLOBIN A1C  02/03/2021  . COLONOSCOPY (Pts 45-56yr Insurance coverage will need to be confirmed)  10/01/2022  . INFLUENZA VACCINE  Completed  . PNA vac Low Risk Adult  Completed   Fall Risk  12/15/2020 12/14/2020 11/03/2020 09/08/2020 07/07/2020  Falls in the past year? 0 '1 1 1 ' 0  Comment - - - Slip and fell off chair 1 month ago. -  Number falls in past yr: 0 1 1 0 0  Comment - - Fall 11/02/20 - -  Injury with Fall? 0 0 0 1 0  Comment - - - - -  Risk Factor Category  - - - - -  Risk for fall due to : - History of fall(s) History of fall(s);Impaired balance/gait;Impaired mobility;Impaired vision;Medication side effect;Orthopedic patient - -  Risk for fall due to: Comment - - - - -  Follow up - - Falls prevention discussed;Education provided;Falls evaluation completed - -   Functional Status Survey:    Vitals:   12/15/20 1007  BP: 116/60  Pulse: 90  Resp: 16  Temp: (!) 97.3 F (36.3 C)  SpO2: 92%  Weight: 177 lb 6.4 oz (80.5 kg)  Height: '6\' 2"'  (1.88 m)   Body mass index is 22.78 kg/m. Physical Exam Vitals reviewed.  Constitutional:      General: He is not in acute distress.     Appearance: He is normal weight. He is not ill-appearing.  HENT:  Head: Normocephalic.     Right Ear: Tympanic membrane, ear canal and external ear normal. There is no impacted cerumen.     Left Ear: Tympanic membrane, ear canal and external ear normal. There is no impacted cerumen.     Nose: Nose normal. No congestion or rhinorrhea.     Mouth/Throat:     Mouth: Mucous membranes are moist.     Pharynx: Oropharynx is clear. No oropharyngeal exudate or posterior oropharyngeal erythema.  Eyes:     General: No scleral icterus.       Right eye: No discharge.        Left eye: No discharge.     Extraocular Movements: Extraocular movements intact.     Conjunctiva/sclera: Conjunctivae normal.     Pupils: Pupils are equal, round, and reactive to light.     Comments: Corrective lens in place   Neck:     Vascular: No carotid bruit.  Cardiovascular:     Rate and Rhythm: Normal rate and regular rhythm.     Pulses: Normal pulses.     Heart sounds: Normal heart sounds. No murmur heard. No friction rub. No gallop.   Pulmonary:     Effort: Pulmonary effort is normal. No respiratory distress.     Breath sounds: Normal breath sounds. No wheezing, rhonchi or rales.  Chest:     Chest wall: No tenderness.  Abdominal:     General: Bowel sounds are normal. There is no distension.     Palpations: Abdomen is soft. There is no mass.     Tenderness: There is no abdominal tenderness. There is no right CVA tenderness, left CVA tenderness, guarding or rebound.  Musculoskeletal:        General: No swelling or tenderness.     Cervical back: Normal range of motion. No rigidity or tenderness.     Right lower leg: No edema.     Left lower leg: No edema.     Right foot: Normal range of motion and normal capillary refill. Bunion present. No tenderness or crepitus. Normal pulse.     Left foot: Normal range of motion and normal capillary refill. No tenderness. Normal pulse.     Comments: Unsteady gait ambulates  with a cane   Lymphadenopathy:     Cervical: No cervical adenopathy.  Skin:    General: Skin is warm and dry.     Coloration: Skin is not pale.     Findings: No bruising, erythema, lesion or rash.  Neurological:     Mental Status: He is alert and oriented to person, place, and time.     Cranial Nerves: No cranial nerve deficit.     Motor: No weakness.     Coordination: Coordination normal.     Gait: Gait abnormal.  Psychiatric:        Mood and Affect: Mood normal.        Behavior: Behavior normal.        Thought Content: Thought content normal.        Judgment: Judgment normal.     Labs reviewed: Recent Labs    10/17/20 1401 10/18/20 0231 10/19/20 0128 11/05/20 0212 11/06/20 0310 11/24/20 1525 11/24/20 1606 11/25/20 0637 12/11/20 1239 12/11/20 1317  NA 128*   < > 131*   < > 137 141   < > 142 135 138  K 4.2   < > 4.5   < > 3.7 3.7   < > 4.1 3.3* 3.8  CL 93*   < >  95*   < > 94* 99  --  100 93* 94*  CO2 21*   < > 25   < > 28 24  --  28 29  --   GLUCOSE 296*   < > 89   < > 115* 146*  --  127* 133* 133*  BUN 154*   < > 85*   < > 50* 89*  --  105* 19 27*  CREATININE 6.76*   < > 7.34*   < > 6.41* 6.13*  --  6.84* 3.08* 3.20*  CALCIUM 7.6*   < > 8.0*   < > 8.4* 7.6*  --  7.7* 8.5*  --   MG  --    < > 2.1  --  2.3  --   --  2.1  --   --   PHOS 5.6*  --   --   --  5.8*  --   --  5.3*  --   --    < > = values in this interval not displayed.   Recent Labs    11/06/20 0310 11/25/20 0637 12/11/20 1239  AST '19 22 20  ' ALT '13 13 14  ' ALKPHOS 100 73 113  BILITOT 0.6 1.1 0.7  PROT 7.5 6.1* 8.1  ALBUMIN 3.0* 2.4* 3.4*   Recent Labs    11/06/20 0310 11/24/20 1525 11/24/20 1606 11/25/20 0637 12/11/20 1239 12/11/20 1317  WBC 6.3 5.7  --  6.8 5.6  --   NEUTROABS 3.9 4.9  --   --  3.9  --   HGB 9.8* 4.7*   < > 7.0* 7.5* 8.5*  HCT 31.8* 16.1*   < > 22.2* 25.4* 25.0*  MCV 102.9* 107.3*  --  100.5* 103.3*  --   PLT 172 180  --  171 208  --    < > = values in this interval  not displayed.   Lab Results  Component Value Date   TSH 0.748 10/17/2020   Lab Results  Component Value Date   HGBA1C 4.9 11/05/2020   Lab Results  Component Value Date   CHOL 65 12/21/2019   HDL 17 (L) 12/21/2019   LDLCALC 29 12/21/2019   TRIG 95 12/21/2019   CHOLHDL 3.8 12/21/2019    Significant Diagnostic Results in last 30 days:  DG Chest Portable 1 View  Result Date: 12/11/2020 CLINICAL DATA:  Shortness of breath EXAM: PORTABLE CHEST 1 VIEW COMPARISON:  11/24/2020 FINDINGS: Cardiomegaly status post median sternotomy and CABG. Both lungs are clear. The visualized skeletal structures are unremarkable. IMPRESSION: Cardiomegaly without acute abnormality of the lungs in AP portable projection. Electronically Signed   By: Eddie Candle M.D.   On: 12/11/2020 13:05   DG CHEST PORT 1 VIEW  Result Date: 11/24/2020 CLINICAL DATA:  Weakness, COVID-19 positivity EXAM: PORTABLE CHEST 1 VIEW COMPARISON:  11/05/2020 FINDINGS: Cardiac shadow is enlarged but stable. Postsurgical changes are again seen. Aortic calcifications are noted. Lungs are well aerated bilaterally. Mild patchy airspace opacities are noted consistent with multifocal pneumonia. No bony abnormality is noted. IMPRESSION: Patchy airspace opacity consistent with the known history of COVID-19 positivity. Electronically Signed   By: Inez Catalina M.D.   On: 11/24/2020 20:24   DG Foot Complete Right  Result Date: 12/10/2020 Please see detailed radiograph report in office note.   Assessment/Plan 1. Essential hypertension B/p well controlled today.  - continue on Coreg and Imdur. - advised to eat a low carbohydrates,low saturated fats and high vegetable diet -  CBC with Differential/Platelet - CMP with eGFR([facility]) - TSH  2. Type 2 diabetes mellitus with diabetic neuropathy, with long-term current use of insulin (HCC) Lab Results  Component Value Date   HGBA1C 4.9 11/05/2020  Recalls Home CBG in the 180's-200's. - dietary  modification as above advised. - continue on Humalog 20 units three times daily if CBG > 150  - Annual foot exam up to date. - Up to date with eye exam but will need records from Ophthalmology for review.  - continue on Gabapentin 300 mg Capsule daily for Neuropathy.  - Immunization reviewed is upto date.  - Hemoglobin A1C  3. Coronary artery disease of native artery of native heart with stable angina pectoris ([place]) - chest pain free. - continue on Imdur,Coreg and ticagrelor. - continue to follow up with Cardiologist as directed.  - smoking cessation advised but not ready to quit.   4. Hyperlipidemia associated with type 2 diabetes mellitus ([place]) Latest LDL at goal.off Statin. - advised to eat a low carbohydrates,low saturated fats and high vegetable diet - Lipid Panel  5. Chronic obstructive pulmonary disease, unspecified COPD type ([place]) Breathing Stable.Reports SOB with Activity but could be multifactorial due to CHF and ESRD.No signs of fluid overload.bilateral lungs Clear.  - continue on Anoro Ellipta  - CMP with eGFR([facility]) - Ambulatory referral to [facility] for Nurse Assistant to assist with ADL's.  6. ESRD (end stage renal disease) on dialysis ([place]) Continue on Renvela 800 mg three times with meals. - continue on HD on M-W-F - CMP with eGFR([facility])  7. Gout, unspecified cause, unspecified chronicity, unspecified site No flare up  - continue on Allopurinol 100 mg tablet daily and Colchicine as needed   8. Gastroesophageal reflux disease without esophagitis Status post PRBC one unit transfusion 12/11/2020.No signs of GI bleed reported.Has referral order to GI in place. - continue on Protonix.   9. Human immunodeficiency virus (HIV) disease ([place]) - continue Biktarvy  - continue to follow up with ID   10. Slow transit constipation - continue on metamucil   11. Chronic combined systolic and diastolic congestive heart failure (HCC) No signs of fluid overload but has  shortness of breath with exertion when doing his activity of daily living.  - Ambulatory referral to [place] Nurse assistant to assist with His ADL's. - continue on Coreg and Imdur - continue to follow up with Cardiologist  - 5 lbs weight gain over three months but on HD   12. Right hip pain Continue on Tramadol 50 mg tablet twice daily as needed and Gabapentin 300 mg capsule daily. - Ambulatory referral to [place] as above   13. Right foot pain S/p oblique extra-articular displaced minimally angulated fracture of the distal right fifth metatarsal diaphysis.Follows up with [facility] specialist  - Ambulatory referral to [facility] as above.   Family/ staff Communication: Reviewed plan of care with patient verbalized understanding   Labs/tests ordered: - CBC with Differential/Platelet - CMP with eGFR([facility]) - TSH - Lipid Panel - Hemoglobin A1C  Next Appointment : 6 months for medical managemet of chronic issues.Labs today.  [name], NP

## 2020-12-15 NOTE — Patient Instructions (Signed)
-   labs drawn today will call you with results.

## 2020-12-16 DIAGNOSIS — D631 Anemia in chronic kidney disease: Secondary | ICD-10-CM | POA: Diagnosis not present

## 2020-12-16 DIAGNOSIS — N2581 Secondary hyperparathyroidism of renal origin: Secondary | ICD-10-CM | POA: Diagnosis not present

## 2020-12-16 DIAGNOSIS — N186 End stage renal disease: Secondary | ICD-10-CM | POA: Diagnosis not present

## 2020-12-16 DIAGNOSIS — E1122 Type 2 diabetes mellitus with diabetic chronic kidney disease: Secondary | ICD-10-CM | POA: Diagnosis not present

## 2020-12-16 DIAGNOSIS — D509 Iron deficiency anemia, unspecified: Secondary | ICD-10-CM | POA: Diagnosis not present

## 2020-12-16 DIAGNOSIS — Z992 Dependence on renal dialysis: Secondary | ICD-10-CM | POA: Diagnosis not present

## 2020-12-16 LAB — CBC WITH DIFFERENTIAL/PLATELET
Absolute Monocytes: 636 cells/uL (ref 200–950)
Basophils Absolute: 42 cells/uL (ref 0–200)
Basophils Relative: 0.7 %
Eosinophils Absolute: 222 cells/uL (ref 15–500)
Eosinophils Relative: 3.7 %
HCT: 26 % — ABNORMAL LOW (ref 38.5–50.0)
Hemoglobin: 8.2 g/dL — ABNORMAL LOW (ref 13.2–17.1)
Lymphs Abs: 1350 cells/uL (ref 850–3900)
MCH: 31.2 pg (ref 27.0–33.0)
MCHC: 31.5 g/dL — ABNORMAL LOW (ref 32.0–36.0)
MCV: 98.9 fL (ref 80.0–100.0)
MPV: 11.5 fL (ref 7.5–12.5)
Monocytes Relative: 10.6 %
Neutro Abs: 3750 cells/uL (ref 1500–7800)
Neutrophils Relative %: 62.5 %
Platelets: 192 10*3/uL (ref 140–400)
RBC: 2.63 10*6/uL — ABNORMAL LOW (ref 4.20–5.80)
RDW: 14.3 % (ref 11.0–15.0)
Total Lymphocyte: 22.5 %
WBC: 6 10*3/uL (ref 3.8–10.8)

## 2020-12-16 LAB — COMPLETE METABOLIC PANEL WITH GFR
AG Ratio: 1 (calc) (ref 1.0–2.5)
ALT: 7 U/L — ABNORMAL LOW (ref 9–46)
AST: 12 U/L (ref 10–35)
Albumin: 3.6 g/dL (ref 3.6–5.1)
Alkaline phosphatase (APISO): 110 U/L (ref 35–144)
BUN/Creatinine Ratio: 9 (calc) (ref 6–22)
BUN: 51 mg/dL — ABNORMAL HIGH (ref 7–25)
CO2: 29 mmol/L (ref 20–32)
Calcium: 8.7 mg/dL (ref 8.6–10.3)
Chloride: 95 mmol/L — ABNORMAL LOW (ref 98–110)
Creat: 5.77 mg/dL — ABNORMAL HIGH (ref 0.70–1.18)
GFR, Est African American: 10 mL/min/{1.73_m2} — ABNORMAL LOW (ref >=60)
GFR, Est Non African American: 9 mL/min/{1.73_m2} — ABNORMAL LOW (ref >=60)
Globulin: 3.5 g/dL (calc) (ref 1.9–3.7)
Glucose, Bld: 112 mg/dL — ABNORMAL HIGH (ref 65–99)
Potassium: 4 mmol/L (ref 3.5–5.3)
Sodium: 138 mmol/L (ref 135–146)
Total Bilirubin: 0.3 mg/dL (ref 0.2–1.2)
Total Protein: 7.1 g/dL (ref 6.1–8.1)

## 2020-12-16 LAB — LIPID PANEL
Cholesterol: 143 mg/dL (ref <200)
HDL: 31 mg/dL — ABNORMAL LOW (ref >=40)
LDL Cholesterol (Calc): 79 mg/dL (calc)
Non-HDL Cholesterol (Calc): 112 mg/dL (calc) (ref <130)
Total CHOL/HDL Ratio: 4.6 (calc) (ref <5.0)
Triglycerides: 248 mg/dL — ABNORMAL HIGH (ref <150)

## 2020-12-16 LAB — HEMOGLOBIN A1C
Hgb A1c MFr Bld: 4.6 % of total Hgb (ref <5.7)
Mean Plasma Glucose: 85 mg/dL
eAG (mmol/L): 4.7 mmol/L

## 2020-12-16 LAB — TSH: TSH: 0.49 mIU/L (ref 0.40–4.50)

## 2020-12-17 ENCOUNTER — Other Ambulatory Visit: Payer: Self-pay | Admitting: Family

## 2020-12-17 ENCOUNTER — Telehealth: Payer: Self-pay

## 2020-12-17 DIAGNOSIS — J449 Chronic obstructive pulmonary disease, unspecified: Secondary | ICD-10-CM

## 2020-12-17 NOTE — Telephone Encounter (Signed)
Agree with you, he needs an appt for evaluation.

## 2020-12-17 NOTE — Telephone Encounter (Signed)
Patient aware of Jessica's reply and states the pain is off and on and he will call if it get's to the point where he feels the need to be seen

## 2020-12-17 NOTE — Telephone Encounter (Signed)
Incoming call received from patient c/o right side pain in hip/kidney area, patient with pain x 1 month or greater. Patient denies injury to the area of concern and also denies pain or discomfort when urinating.  Patient states pain is more significant when ambulating.  I offered patient an appointment to be seen today and he replied " I have to go to the grocery store today." I offered patient an appointment for tomorrow (pm) after dialysis and patient states " I am so tired and in pain after dialysis, it is best that I go home following that appointment."  Patient asked that I forward this message to [name] and have her advise on what could potentially be going on. I stressed again that he would need an appointment.  Please advise

## 2020-12-18 ENCOUNTER — Telehealth: Payer: Self-pay

## 2020-12-18 DIAGNOSIS — D509 Iron deficiency anemia, unspecified: Secondary | ICD-10-CM | POA: Diagnosis not present

## 2020-12-18 DIAGNOSIS — E1122 Type 2 diabetes mellitus with diabetic chronic kidney disease: Secondary | ICD-10-CM | POA: Diagnosis not present

## 2020-12-18 DIAGNOSIS — N2581 Secondary hyperparathyroidism of renal origin: Secondary | ICD-10-CM | POA: Diagnosis not present

## 2020-12-18 DIAGNOSIS — D631 Anemia in chronic kidney disease: Secondary | ICD-10-CM | POA: Diagnosis not present

## 2020-12-18 DIAGNOSIS — Z992 Dependence on renal dialysis: Secondary | ICD-10-CM | POA: Diagnosis not present

## 2020-12-18 DIAGNOSIS — N186 End stage renal disease: Secondary | ICD-10-CM | POA: Diagnosis not present

## 2020-12-18 NOTE — Telephone Encounter (Signed)
LMOM to return call.

## 2020-12-18 NOTE — Telephone Encounter (Signed)
Incoming call received from patient staing the PA seen him at dialysis this morning from the [facility] on [address] (not sure of his name). Jacob [name] asked about the dietary recommendations from [place] following most recent labs. Th PA  him he does not need to follow a high vegetable diet, as vegetables are high in potassium and can cause him to swell up. Patient was also told he does not need to drink too much water as that can also contribute to him swelling.  Jacob [name] is confused with the contradicting recommendations from 2 different providers, in 2 different specialities. Patient states " I guess Jacob [name] needs to consult with the PA since she is my PCP."  Patient aware that I will send this message to Jacob [name] and Jacob [name] and they will have Jacob [name] return his call with recommendations or advice.

## 2020-12-18 NOTE — Telephone Encounter (Signed)
Patent notified and agreed.

## 2020-12-18 NOTE — Telephone Encounter (Signed)
Follow renal appropriate diet and fluid as allowed by Nephrologist.

## 2020-12-19 ENCOUNTER — Other Ambulatory Visit: Payer: Self-pay | Admitting: Family

## 2020-12-19 DIAGNOSIS — J449 Chronic obstructive pulmonary disease, unspecified: Secondary | ICD-10-CM

## 2020-12-21 ENCOUNTER — Encounter (HOSPITAL_COMMUNITY): Payer: Self-pay | Admitting: *Deleted

## 2020-12-21 ENCOUNTER — Other Ambulatory Visit: Payer: Self-pay

## 2020-12-21 ENCOUNTER — Inpatient Hospital Stay (HOSPITAL_COMMUNITY)
Admission: EM | Admit: 2020-12-21 | Discharge: 2020-12-24 | DRG: 377 | Disposition: A | Discharge status: Home / Self Care | Payer: Medicare Other | Attending: Internal Medicine | Admitting: Internal Medicine

## 2020-12-21 ENCOUNTER — Encounter (HOSPITAL_COMMUNITY): Payer: Self-pay

## 2020-12-21 ENCOUNTER — Emergency Department (HOSPITAL_COMMUNITY)
Admission: EM | Admit: 2020-12-21 | Discharge: 2020-12-21 | Disposition: A | Discharge status: Home / Self Care | Payer: Medicare Other | Source: Home / Self Care | Attending: Emergency Medicine | Admitting: Emergency Medicine

## 2020-12-21 ENCOUNTER — Emergency Department (HOSPITAL_COMMUNITY): Payer: Medicare Other

## 2020-12-21 ENCOUNTER — Telehealth: Payer: Self-pay | Admitting: *Deleted

## 2020-12-21 DIAGNOSIS — D62 Acute posthemorrhagic anemia: Secondary | ICD-10-CM | POA: Diagnosis present

## 2020-12-21 DIAGNOSIS — Z833 Family history of diabetes mellitus: Secondary | ICD-10-CM

## 2020-12-21 DIAGNOSIS — D649 Anemia, unspecified: Secondary | ICD-10-CM | POA: Insufficient documentation

## 2020-12-21 DIAGNOSIS — E1142 Type 2 diabetes mellitus with diabetic polyneuropathy: Secondary | ICD-10-CM | POA: Insufficient documentation

## 2020-12-21 DIAGNOSIS — I5042 Chronic combined systolic (congestive) and diastolic (congestive) heart failure: Secondary | ICD-10-CM | POA: Diagnosis present

## 2020-12-21 DIAGNOSIS — N2581 Secondary hyperparathyroidism of renal origin: Secondary | ICD-10-CM | POA: Diagnosis not present

## 2020-12-21 DIAGNOSIS — Z992 Dependence on renal dialysis: Secondary | ICD-10-CM | POA: Insufficient documentation

## 2020-12-21 DIAGNOSIS — E1122 Type 2 diabetes mellitus with diabetic chronic kidney disease: Secondary | ICD-10-CM | POA: Insufficient documentation

## 2020-12-21 DIAGNOSIS — N186 End stage renal disease: Secondary | ICD-10-CM

## 2020-12-21 DIAGNOSIS — I252 Old myocardial infarction: Secondary | ICD-10-CM

## 2020-12-21 DIAGNOSIS — M109 Gout, unspecified: Secondary | ICD-10-CM | POA: Diagnosis present

## 2020-12-21 DIAGNOSIS — R110 Nausea: Secondary | ICD-10-CM | POA: Insufficient documentation

## 2020-12-21 DIAGNOSIS — K31819 Angiodysplasia of stomach and duodenum without bleeding: Secondary | ICD-10-CM | POA: Diagnosis not present

## 2020-12-21 DIAGNOSIS — Z8249 Family history of ischemic heart disease and other diseases of the circulatory system: Secondary | ICD-10-CM

## 2020-12-21 DIAGNOSIS — Z8616 Personal history of COVID-19: Secondary | ICD-10-CM | POA: Diagnosis not present

## 2020-12-21 DIAGNOSIS — K922 Gastrointestinal hemorrhage, unspecified: Secondary | ICD-10-CM

## 2020-12-21 DIAGNOSIS — K219 Gastro-esophageal reflux disease without esophagitis: Secondary | ICD-10-CM | POA: Diagnosis present

## 2020-12-21 DIAGNOSIS — I132 Hypertensive heart and chronic kidney disease with heart failure and with stage 5 chronic kidney disease, or end stage renal disease: Secondary | ICD-10-CM | POA: Diagnosis present

## 2020-12-21 DIAGNOSIS — Z888 Allergy status to other drugs, medicaments and biological substances status: Secondary | ICD-10-CM | POA: Diagnosis not present

## 2020-12-21 DIAGNOSIS — B20 Human immunodeficiency virus [HIV] disease: Secondary | ICD-10-CM | POA: Diagnosis present

## 2020-12-21 DIAGNOSIS — Z955 Presence of coronary angioplasty implant and graft: Secondary | ICD-10-CM

## 2020-12-21 DIAGNOSIS — Z881 Allergy status to other antibiotic agents status: Secondary | ICD-10-CM | POA: Diagnosis not present

## 2020-12-21 DIAGNOSIS — R109 Unspecified abdominal pain: Secondary | ICD-10-CM | POA: Diagnosis not present

## 2020-12-21 DIAGNOSIS — F17210 Nicotine dependence, cigarettes, uncomplicated: Secondary | ICD-10-CM | POA: Insufficient documentation

## 2020-12-21 DIAGNOSIS — D696 Thrombocytopenia, unspecified: Secondary | ICD-10-CM | POA: Diagnosis present

## 2020-12-21 DIAGNOSIS — F1010 Alcohol abuse, uncomplicated: Secondary | ICD-10-CM | POA: Diagnosis present

## 2020-12-21 DIAGNOSIS — E1140 Type 2 diabetes mellitus with diabetic neuropathy, unspecified: Secondary | ICD-10-CM | POA: Insufficient documentation

## 2020-12-21 DIAGNOSIS — H409 Unspecified glaucoma: Secondary | ICD-10-CM | POA: Diagnosis present

## 2020-12-21 DIAGNOSIS — I129 Hypertensive chronic kidney disease with stage 1 through stage 4 chronic kidney disease, or unspecified chronic kidney disease: Secondary | ICD-10-CM | POA: Diagnosis not present

## 2020-12-21 DIAGNOSIS — Z951 Presence of aortocoronary bypass graft: Secondary | ICD-10-CM

## 2020-12-21 DIAGNOSIS — Z7901 Long term (current) use of anticoagulants: Secondary | ICD-10-CM | POA: Insufficient documentation

## 2020-12-21 DIAGNOSIS — Z79899 Other long term (current) drug therapy: Secondary | ICD-10-CM | POA: Diagnosis not present

## 2020-12-21 DIAGNOSIS — Z823 Family history of stroke: Secondary | ICD-10-CM

## 2020-12-21 DIAGNOSIS — D631 Anemia in chronic kidney disease: Secondary | ICD-10-CM | POA: Diagnosis present

## 2020-12-21 DIAGNOSIS — Z21 Asymptomatic human immunodeficiency virus [HIV] infection status: Secondary | ICD-10-CM | POA: Insufficient documentation

## 2020-12-21 DIAGNOSIS — Z7951 Long term (current) use of inhaled steroids: Secondary | ICD-10-CM | POA: Diagnosis not present

## 2020-12-21 DIAGNOSIS — I2510 Atherosclerotic heart disease of native coronary artery without angina pectoris: Secondary | ICD-10-CM | POA: Diagnosis not present

## 2020-12-21 DIAGNOSIS — E785 Hyperlipidemia, unspecified: Secondary | ICD-10-CM | POA: Diagnosis present

## 2020-12-21 DIAGNOSIS — I255 Ischemic cardiomyopathy: Secondary | ICD-10-CM | POA: Diagnosis present

## 2020-12-21 DIAGNOSIS — I959 Hypotension, unspecified: Secondary | ICD-10-CM | POA: Diagnosis present

## 2020-12-21 DIAGNOSIS — Z794 Long term (current) use of insulin: Secondary | ICD-10-CM | POA: Insufficient documentation

## 2020-12-21 DIAGNOSIS — Z20822 Contact with and (suspected) exposure to covid-19: Secondary | ICD-10-CM | POA: Diagnosis present

## 2020-12-21 DIAGNOSIS — Z7902 Long term (current) use of antithrombotics/antiplatelets: Secondary | ICD-10-CM | POA: Diagnosis not present

## 2020-12-21 DIAGNOSIS — I120 Hypertensive chronic kidney disease with stage 5 chronic kidney disease or end stage renal disease: Secondary | ICD-10-CM | POA: Diagnosis not present

## 2020-12-21 DIAGNOSIS — J449 Chronic obstructive pulmonary disease, unspecified: Secondary | ICD-10-CM | POA: Diagnosis not present

## 2020-12-21 DIAGNOSIS — K31811 Angiodysplasia of stomach and duodenum with bleeding: Secondary | ICD-10-CM | POA: Diagnosis not present

## 2020-12-21 DIAGNOSIS — Q2730 Arteriovenous malformation, site unspecified: Secondary | ICD-10-CM

## 2020-12-21 DIAGNOSIS — K921 Melena: Secondary | ICD-10-CM | POA: Diagnosis not present

## 2020-12-21 DIAGNOSIS — Z820 Family history of epilepsy and other diseases of the nervous system: Secondary | ICD-10-CM

## 2020-12-21 DIAGNOSIS — Z743 Need for continuous supervision: Secondary | ICD-10-CM | POA: Diagnosis not present

## 2020-12-21 DIAGNOSIS — K3189 Other diseases of stomach and duodenum: Secondary | ICD-10-CM | POA: Diagnosis not present

## 2020-12-21 DIAGNOSIS — R6889 Other general symptoms and signs: Secondary | ICD-10-CM | POA: Diagnosis not present

## 2020-12-21 DIAGNOSIS — I10 Essential (primary) hypertension: Secondary | ICD-10-CM | POA: Diagnosis present

## 2020-12-21 DIAGNOSIS — I4510 Unspecified right bundle-branch block: Secondary | ICD-10-CM | POA: Diagnosis not present

## 2020-12-21 DIAGNOSIS — D509 Iron deficiency anemia, unspecified: Secondary | ICD-10-CM | POA: Diagnosis not present

## 2020-12-21 DIAGNOSIS — R9431 Abnormal electrocardiogram [ECG] [EKG]: Secondary | ICD-10-CM | POA: Diagnosis not present

## 2020-12-21 LAB — CBC WITH DIFFERENTIAL/PLATELET
Abs Immature Granulocytes: 0.02 10*3/uL (ref 0.00–0.07)
Basophils Absolute: 0 10*3/uL (ref 0.0–0.1)
Basophils Relative: 0 %
Eosinophils Absolute: 0.1 10*3/uL (ref 0.0–0.5)
Eosinophils Relative: 1 %
HCT: 20.6 % — ABNORMAL LOW (ref 39.0–52.0)
Hemoglobin: 6.2 g/dL — CL (ref 13.0–17.0)
Immature Granulocytes: 0 %
Lymphocytes Relative: 13 %
Lymphs Abs: 1 10*3/uL (ref 0.7–4.0)
MCH: 31 pg (ref 26.0–34.0)
MCHC: 30.1 g/dL (ref 30.0–36.0)
MCV: 103 fL — ABNORMAL HIGH (ref 80.0–100.0)
Monocytes Absolute: 0.6 10*3/uL (ref 0.1–1.0)
Monocytes Relative: 8 %
Neutro Abs: 5.8 10*3/uL (ref 1.7–7.7)
Neutrophils Relative %: 78 %
Platelets: 168 10*3/uL (ref 150–400)
RBC: 2 MIL/uL — ABNORMAL LOW (ref 4.22–5.81)
RDW: 16.3 % — ABNORMAL HIGH (ref 11.5–15.5)
WBC: 7.5 10*3/uL (ref 4.0–10.5)
nRBC: 0 % (ref 0.0–0.2)

## 2020-12-21 LAB — CBC
HCT: 19.3 % — ABNORMAL LOW (ref 39.0–52.0)
Hemoglobin: 6 g/dL — CL (ref 13.0–17.0)
MCH: 30.8 pg (ref 26.0–34.0)
MCHC: 31.1 g/dL (ref 30.0–36.0)
MCV: 99 fL (ref 80.0–100.0)
Platelets: 147 10*3/uL — ABNORMAL LOW (ref 150–400)
RBC: 1.95 MIL/uL — ABNORMAL LOW (ref 4.22–5.81)
RDW: 19 % — ABNORMAL HIGH (ref 11.5–15.5)
WBC: 7.1 10*3/uL (ref 4.0–10.5)
nRBC: 0 % (ref 0.0–0.2)

## 2020-12-21 LAB — COMPREHENSIVE METABOLIC PANEL
ALT: 9 U/L (ref 0–44)
AST: 16 U/L (ref 15–41)
Albumin: 2.8 g/dL — ABNORMAL LOW (ref 3.5–5.0)
Alkaline Phosphatase: 79 U/L (ref 38–126)
Anion gap: 14 (ref 5–15)
BUN: 81 mg/dL — ABNORMAL HIGH (ref 8–23)
CO2: 26 mmol/L (ref 22–32)
Calcium: 8.7 mg/dL — ABNORMAL LOW (ref 8.9–10.3)
Chloride: 101 mmol/L (ref 98–111)
Creatinine, Ser: 5.47 mg/dL — ABNORMAL HIGH (ref 0.61–1.24)
GFR, Estimated: 10 mL/min — ABNORMAL LOW (ref >60)
Glucose, Bld: 130 mg/dL — ABNORMAL HIGH (ref 70–99)
Potassium: 3.8 mmol/L (ref 3.5–5.1)
Sodium: 141 mmol/L (ref 135–145)
Total Bilirubin: 0.8 mg/dL (ref 0.3–1.2)
Total Protein: 6.8 g/dL (ref 6.5–8.1)

## 2020-12-21 LAB — BASIC METABOLIC PANEL
Anion gap: 15 (ref 5–15)
BUN: 121 mg/dL — ABNORMAL HIGH (ref 8–23)
CO2: 24 mmol/L (ref 22–32)
Calcium: 9.1 mg/dL (ref 8.9–10.3)
Chloride: 103 mmol/L (ref 98–111)
Creatinine, Ser: 7.43 mg/dL — ABNORMAL HIGH (ref 0.61–1.24)
GFR, Estimated: 7 mL/min — ABNORMAL LOW (ref >60)
Glucose, Bld: 85 mg/dL (ref 70–99)
Potassium: 4.1 mmol/L (ref 3.5–5.1)
Sodium: 142 mmol/L (ref 135–145)

## 2020-12-21 LAB — PREPARE RBC (CROSSMATCH)

## 2020-12-21 LAB — POC OCCULT BLOOD, ED: Fecal Occult Bld: POSITIVE — AB

## 2020-12-21 LAB — LIPASE, BLOOD: Lipase: 73 U/L — ABNORMAL HIGH (ref 11–51)

## 2020-12-21 MED ORDER — SODIUM CHLORIDE 0.9 % IV SOLN: 10.0000 mL/h | Freq: Once | INTRAVENOUS | Status: DC

## 2020-12-21 MED ORDER — ONDANSETRON HCL 4 MG/2ML IJ SOLN
4.0000 mg | Freq: Once | INTRAMUSCULAR | Status: AC
  Administered 2020-12-21: 4 mg via INTRAVENOUS
  Filled 2020-12-21: qty 2

## 2020-12-21 MED ORDER — FENTANYL CITRATE (PF) 100 MCG/2ML IJ SOLN
50.0000 ug | Freq: Once | INTRAMUSCULAR | Status: AC
Start: 2020-12-21 — End: 2020-12-21
  Administered 2020-12-21: 50 ug via INTRAVENOUS
  Filled 2020-12-21: qty 2

## 2020-12-21 MED ORDER — SODIUM CHLORIDE 0.9 % IV SOLN
10.0000 mL/h | Freq: Once | INTRAVENOUS | Status: AC
  Administered 2020-12-21: 10 mL/h via INTRAVENOUS

## 2020-12-21 NOTE — ED Notes (Signed)
Patient refusing to sign AMA form. RN and EDP explained to patient in depth that refusing to be admitted to the hospital for recommended treatment and going home is considered leaving against medical advice. Patient refusing dc vitals at this time.

## 2020-12-21 NOTE — ED Notes (Signed)
Pt is ambulatory to the bathroom.

## 2020-12-21 NOTE — ED Provider Notes (Signed)
[facility] EMERGENCY DEPARTMENT Provider Note   CSN: [id number] Arrival date & time: 12/21/20  3762     History Chief Complaint  Patient presents with  . Abdominal Pain    Jacob [name] is a 70 y.o. male.  HPI Patient presents from dialysis with hypotension.  States his belly has been bothering him.  Had some nausea and upper abdominal pain.  States he is felt some acid in his throat but has not actually vomited.  Pain is dull.  States he did have a bowel movement at around 11:00 last night that was normal.  States he feels a little bad all over.  Reportedly had sats of 70s at dialysis.  Did a 200 cc bolus and had 40 minutes of dialysis and then transferred into here.  No fevers.  Patient feels as if he could be anemic again.  States because 1 foot is swollen with a more than the other.  Pain is a little crampy.  No fevers.  No difficulty breathing.    Past Medical History:  Diagnosis Date  . Acute respiratory failure ([place]) 03/01/2018  . Arthritis    "all over; mostly knees and back" (02/28/2018)  . Chronic lower back pain    stenosis  . Community acquired pneumonia 09/06/2013  . COPD (chronic obstructive pulmonary disease) ([place])   . Coronary atherosclerosis of native coronary artery    a. 02/2003 s/p CABG x 2 (VG->RI, VG->RPDA; b. 11/2019 PCI: LM nl, LAD 90d, D3 50, RI 100, LCX 100p, OM3 100 - fills via L->L collats from D2/dLAD, RCA 100p, VG->RPDA ok, VG->RI 95 (3.5x48 Synergy XD DES).  . Drug abuse ([place])    hx; tested for cocaine as recently as 2/08. says he is not using drugs now - avoided defib. for this reason   . ESRD (end stage renal disease) ([place])    Hemo M-W-F- Richarda Blade  . Fall at home 10/2020  . GERD (gastroesophageal reflux disease)    takes OTC meds as needed  . GI bleeding    a. 11/2019 EGD: angiodysplastic lesions w/ bleeding s/p argon plasma/clipping/epi inj.  . Glaucoma    uses eye drops daily  . Hepatitis B 1968   "tx'd w/isolation; caught it  from toilet stools in gym"  . HFrEF (heart failure with reduced ejection fraction) ([place])    a. 01/2019 Echo: EF 40-45%, diffuse HK, mild basal septal hypertrophy.  . History of blood transfusion 03/01/2019  . History of colon polyps    benign  . History of gout    takes Allopurinol daily as well as Colchicine-if needed (02/28/2018)  . History of kidney stones   . HTN (hypertension)    takes Coreg,Imdur.and Apresoline daily  . Human immunodeficiency virus (HIV) disease ([place]) dx'd 1995   takes Genvoya daily  . Hyperlipidemia    takes Atorvastatin daily  . Ischemic cardiomyopathy    a. 01/2019 Echo: EF 40-45%, diffuse HK, mild basal septal hypertrophy. Diast dysfxn. Nl RV size/fxn. Sev dil LA. Triv MR/TR/PR.  [name] Muscle spasm    takes Zanaflex as needed  . Myocardial infarction ([place]) ~ 2004/2005  . Nocturia   . Peripheral neuropathy    takes gabapentin daily  . Pneumonia    "at least twice" (02/28/2018)  . Shortness of breath dyspnea    rarely but if notices it then with exertion  . Syphilis, unspecified   . Type II diabetes mellitus ([place]) 2004   Lantus daily.Average fasting blood sugar 125-199  .  Wears glasses   . Wears partial dentures     Patient Active Problem List   Diagnosis Date Noted  . Acute upper GI bleed 11/24/2020  . COVID-19 virus infection 11/24/2020  . Subdural hematoma ([place]) 11/06/2020  . Fall at home, initial encounter 11/05/2020  . Nicotine dependence, cigarettes, uncomplicated 12/45/8099  . Alcohol abuse 11/05/2020  . GERD without esophagitis 11/05/2020  . Unspecified trochanteric fracture of left femur, initial encounter for closed fracture ([place]) 11/05/2020  . Traumatic subdural hematoma, initial encounter ([place]) 11/05/2020  . Fracture of metatarsal of right foot, closed 11/05/2020  . Nondisplaced fracture of greater trochanter of left femur, initial encounter for closed fracture ([place])   . Uremia   . Acute GI bleeding 10/17/2020  . AVM (arteriovenous  malformation) of small bowel, acquired with hemorrhage   . Fluid overload 10/16/2020  . Cardiomyopathy ([place]) 12/21/2019  . Iron deficiency anemia   . Acute blood loss anemia   . Acute on chronic anemia   . AVM (arteriovenous malformation) of duodenum, acquired   . Abnormal nuclear stress test 12/19/2019  . ESRD on hemodialysis ([place])   . Hand pain, left   . Cocaine abuse ([place])   . Hypertensive emergency   . CHF exacerbation ([place]) 07/21/2019  . Diabetic foot ulcer ([place]) 02/11/2019  . Gastric AVM   . Melena 02/07/2019  . CKD stage 4 due to type 2 diabetes mellitus ([place]) 05/30/2018  . Hyperlipidemia associated with type 2 diabetes mellitus ([place]) 05/30/2018  . Right foot ulcer ([place]) 02/28/2018  . Chronic obstructive pulmonary disease ([place]) 02/28/2018  . Anemia of chronic disease 11/20/2016  . CKD (chronic kidney disease) stage 3, GFR 30-59 ml/min (HCC) 11/20/2016  . CHF (congestive heart failure) ([place]) 11/10/2016  . History of lumbar laminectomy for spinal cord decompression 02/29/2016  . Type 2 diabetes mellitus with diabetic polyneuropathy, with long-term current use of insulin ([place]) 06/17/2015  . HTN (hypertension) 04/26/2015  . DM (diabetes mellitus) ([place]) 04/26/2015  . Ischemic cardiomyopathy 05/12/2014  . Ulcer of lower extremity ([place]) 09/06/2013  . Hyperkalemia 09/06/2013  . Chest pain 09/06/2013  . Type II or unspecified type diabetes mellitus with unspecified complication, not stated as uncontrolled 10/22/2012  . Bunion of left foot 11/25/2011  . Bunion, right foot 11/25/2011  . Polysubstance abuse ([place]) 07/28/2011  . Hip fracture, left ([place]) 04/04/2011  . Closed fracture of neck of femur ([place]) 04/04/2011  . Insomnia 02/18/2011  . Coronary artery disease involving native heart without angina pectoris 04/20/2009  . Chronic combined systolic (congestive) and diastolic (congestive) heart failure ([place]) 04/20/2009  . Mixed hyperlipidemia 11/20/2006  . Gout 11/20/2006  .  TOBACCO ABUSE 11/20/2006  . Essential hypertension 11/20/2006  . Human immunodeficiency virus (HIV) disease ([place]) 09/02/2006    Past Surgical History:  Procedure Laterality Date  . AV FISTULA PLACEMENT Left 08/02/2018   Procedure: ARTERIOVENOUS (AV) FISTULA CREATION  left arm radiocephlic;  Surgeon: Marty Heck, MD;  Location: [place];  Service: Vascular;  Laterality: Left;  . AV FISTULA PLACEMENT Left 08/01/2019   Procedure: LEFT BRACHIOCEPHALIC ARTERIOVENOUS (AV) FISTULA CREATION;  Surgeon: [name], MD;  Location: [place];  Service: Vascular;  Laterality: Left;  . BASCILIC VEIN TRANSPOSITION Left 10/03/2019   Procedure: BASILIC VEIN TRANSPOSITION LEFT SECOND STAGE;  Surgeon: [name], MD;  Location: [place];  Service: Vascular;  Laterality: Left;  . CARDIAC CATHETERIZATION  10/2002; 12/19/2004   [name] 03/08/2011  . COLONOSCOPY  2013   Dr.John Henrene Pastor   .  CORONARY ARTERY BYPASS GRAFT  02/24/2003   CABG X2/notes 03/08/2011  . CORONARY STENT INTERVENTION N/A 12/19/2019   Procedure: CORONARY STENT INTERVENTION;  Surgeon: Jettie Booze, MD;  Location: [place] [facility];  Service: Cardiovascular;  Laterality: N/A;  . ESOPHAGOGASTRODUODENOSCOPY (EGD) WITH PROPOFOL N/A 02/08/2019   Procedure: ESOPHAGOGASTRODUODENOSCOPY (EGD) WITH PROPOFOL;  Surgeon: Milus Banister, MD;  Location: [place];  Service: Gastroenterology;  Laterality: N/A;  . ESOPHAGOGASTRODUODENOSCOPY (EGD) WITH PROPOFOL N/A 12/22/2019   Procedure: ESOPHAGOGASTRODUODENOSCOPY (EGD) WITH PROPOFOL;  Surgeon: Lavena Bullion, DO;  Location: [place];  Service: Gastroenterology;  Laterality: N/A;  . ESOPHAGOGASTRODUODENOSCOPY (EGD) WITH PROPOFOL N/A 10/19/2020   Procedure: ESOPHAGOGASTRODUODENOSCOPY (EGD) WITH PROPOFOL;  Surgeon: Jackquline Denmark, MD;  Location: [facility] ENDOSCOPY;  Service: Endoscopy;  Laterality: N/A;  . HEMOSTASIS CLIP PLACEMENT  12/22/2019   Procedure: HEMOSTASIS CLIP PLACEMENT;  Surgeon:  Lavena Bullion, DO;  Location: [place];  Service: Gastroenterology;;  . HOT HEMOSTASIS N/A 02/08/2019   Procedure: HOT HEMOSTASIS (ARGON PLASMA COAGULATION/BICAP);  Surgeon: Milus Banister, MD;  Location: [facility] ENDOSCOPY;  Service: Gastroenterology;  Laterality: N/A;  . HOT HEMOSTASIS N/A 12/22/2019   Procedure: HOT HEMOSTASIS (ARGON PLASMA COAGULATION/BICAP);  Surgeon: Lavena Bullion, DO;  Location: [facility] ENDOSCOPY;  Service: Gastroenterology;  Laterality: N/A;  . HOT HEMOSTASIS N/A 10/19/2020   Procedure: HOT HEMOSTASIS (ARGON PLASMA COAGULATION/BICAP);  Surgeon: Jackquline Denmark, MD;  Location: [facility] ENDOSCOPY;  Service: Endoscopy;  Laterality: N/A;  . INTERTROCHANTERIC HIP FRACTURE SURGERY Left 11/2006   [name] 03/08/2011  . INTRAVASCULAR ULTRASOUND/IVUS N/A 12/19/2019   Procedure: Intravascular Ultrasound/IVUS;  Surgeon: Jettie Booze, MD;  Location: [place] CV LAB;  Service: Cardiovascular;  Laterality: N/A;  . IR FLUORO GUIDE CV LINE RIGHT  07/24/2019  . IR FLUORO GUIDE CV LINE RIGHT  07/30/2019  . IR US GUIDE VASC ACCESS RIGHT  07/24/2019  . IR US GUIDE VASC ACCESS RIGHT  07/30/2019  . LAPAROSCOPIC CHOLECYSTECTOMY  05/2006  . LIGATION OF COMPETING BRANCHES OF ARTERIOVENOUS FISTULA Left 11/05/2018   Procedure: LIGATION OF COMPETING BRANCHES OF ARTERIOVENOUS FISTULA  LEFT  ARM;  Surgeon: Marty Heck, MD;  Location: [place];  Service: Vascular;  Laterality: Left;  . LUMBAR LAMINECTOMY/DECOMPRESSION MICRODISCECTOMY N/A 02/29/2016   Procedure: Left L4-5 Lateral Recess Decompression, Removal Extradural Intraspinal Facet Cyst;  Surgeon: Marybelle Killings, MD;  Location: [place];  Service: Orthopedics;  Laterality: N/A;  . MULTIPLE TOOTH EXTRACTIONS    . ORIF MANDIBULAR FRACTURE Left 08/13/2004   ORIF of left body fracture mandible with KLS Martin 2.3-mm six hole/notes 03/08/2011  . RIGHT/LEFT HEART CATH AND CORONARY/GRAFT ANGIOGRAPHY N/A 12/19/2019   Procedure: RIGHT/LEFT HEART CATH AND  CORONARY/GRAFT ANGIOGRAPHY;  Surgeon: Jettie Booze, MD;  Location: [place] CV LAB;  Service: Cardiovascular;  Laterality: N/A;  . SCLEROTHERAPY  12/22/2019   Procedure: SCLEROTHERAPY;  Surgeon: Lavena Bullion, DO;  Location: [facility] ENDOSCOPY;  Service: Gastroenterology;;       Family History  Problem Relation Age of Onset  . Heart failure Father   . Hypertension Father   . Diabetes Brother   . Heart attack Brother   . Alzheimer's disease Mother   . Stroke Sister   . Diabetes Sister   . Alzheimer's disease Sister   . Hypertension Brother   . Diabetes Brother   . Drug abuse Brother   . Colon cancer Neg Hx     Social History   Tobacco Use  . Smoking status: Current Every Day  Smoker    Packs/day: 0.50    Years: 43.00    Pack years: 21.50    Types: Cigarettes  . Smokeless tobacco: Never Used  Vaping Use  . Vaping Use: Never used  Substance Use Topics  . Alcohol use: Not Currently    Alcohol/week: 12.0 standard drinks    Types: 12 Standard drinks or equivalent per week  . Drug use: Not Currently    Types: Cocaine    Comment: hx of crack/cocaine 39yrs ago 10/01/2019- none    Home Medications Prior to Admission medications   Medication Sig Start Date End Date Taking? Authorizing Provider  acetaminophen (TYLENOL) 650 MG CR tablet Take 650 mg by mouth 3 (three) times daily.    [provider]  allopurinol (ZYLOPRIM) 100 MG tablet TAKE 1 TABLET(100 MG) BY MOUTH DAILY 07/23/20   [name], NP  ANORO ELLIPTA 62.5-25 MCG/INH AEPB INHALE 1 PUFF BY MOUTH EVERY DAY 12/21/20   [name], NP  B-D UF III MINI PEN NEEDLES 31G X 5 MM MISC USE FOUR TIMES DAILY 03/02/20   [name], NP  bictegravir-emtricitabine-tenofovir AF (BIKTARVY) 50-200-25 MG TABS tablet Take 1 tablet by mouth daily. 06/30/20   [name], MD  carvedilol (COREG) 3.125 MG tablet Take 1 tablet (3.125 mg total) by mouth 2 (two) times daily. 02/13/20   [name], MD   Cinacalcet HCl (SENSIPAR PO) Take by mouth.  08/14/20 08/13/21  [provider]  colchicine 0.6 MG tablet Take 0.6-1.2 mg by mouth daily as needed (gout flare-up).    [provider]  diclofenac Sodium (VOLTAREN) 1 % GEL APPLY 2 GRAMS TO THE AFFECTED AREA THREE TIMES DAILY AS NEEDED FOR PAIN 11/16/20   [name], NP  folic acid (FOLVITE) 1 MG tablet Take 1 tablet (1 mg total) by mouth daily. 11/30/20   [name], NP  gabapentin (NEURONTIN) 300 MG capsule Take 1 capsule (300 mg total) by mouth daily. Dose change due to renal function 12/09/20   [name], NP  insulin lispro (HUMALOG KWIKPEN) 100 UNIT/ML KwikPen Inject 0.78ml (20 Units total) into the skin 3 (three) times daily as needed for high blood sugar (above 150) Patient taking differently: Inject 0.24ml (20 Units total) into the skin 3 (three) times daily as needed for high blood sugar (above 150) 03/12/20   [name], NP  iron sucrose in sodium chloride 0.9 % 100 mL Iron Sucrose (Venofer) 07/08/20 07/07/21  [provider]  isosorbide mononitrate (IMDUR) 30 MG 24 hr tablet TAKE 1 TABLET BY MOUTH EVERY DAY 08/10/20   [name], NP  Lancets (ONETOUCH DELICA PLUS [id number]) MISC Inject 1 Device as directed 3 (three) times daily. Dx: E11.40 09/18/19   [name], NP  latanoprost (XALATAN) 0.005 % ophthalmic solution Place 1 drop into both eyes at bedtime.    [provider]  Methoxy PEG-Epoetin Beta (MIRCERA IJ) Mircera 07/06/20   [provider]  multivitamin (RENA-VIT) TABS tablet Take 1 tablet by mouth daily.    [provider]  nitroGLYCERIN (NITROSTAT) 0.3 MG SL tablet ONE TABLET UNDER TONGUE AS NEEDED FOR CHEST PAIN 05/01/19   [name], MD  Nutritional Supplements (VITAMIN D BOOSTER PO) Take 1 each by mouth 2 (two) times daily. 08/12/20 08/11/21  [provider]  ONETOUCH ULTRA test strip CHECK BLOOD SUGAR 3 TIMES  DAILY 05/27/20    [name], NP  pantoprazole (PROTONIX) 40 MG tablet  TAKE 1 TABLET(40 MG) BY MOUTH TWICE DAILY 10/19/20   Fayrene Helper, MD  polyethylene glycol (MIRALAX / GLYCOLAX) 17 g packet Take 17 g by mouth daily as needed for mild constipation. 11/07/20   Aline August, MD  sevelamer carbonate (RENVELA) 800 MG tablet Take 800 mg by mouth 3 (three) times daily with meals.    [provider]  ticagrelor (BRILINTA) 90 MG TABS tablet Take 90 mg by mouth 2 (two) times daily.    [provider]  timolol (TIMOPTIC) 0.5 % ophthalmic solution Place 1 drop into both eyes daily. 04/04/19   [provider]  traMADol (ULTRAM) 50 MG tablet Take 1 tablet (50 mg total) by mouth 2 (two) times daily as needed. 11/20/20   Marybelle Killings, MD    Allergies    Augmentin [amoxicillin-pot clavulanate], Mucinex fast-max, and Amphetamines  Review of Systems   Review of Systems  Constitutional: Positive for appetite change. Negative for fatigue and fever.  HENT: Negative for congestion.   Respiratory: Negative for shortness of breath.   Cardiovascular: Positive for leg swelling.  Gastrointestinal: Positive for abdominal pain and nausea. Negative for constipation and vomiting.  Genitourinary: Negative for frequency.  Musculoskeletal: Negative for back pain.  Skin: Negative for rash.  Neurological: Negative for weakness.  Psychiatric/Behavioral: Negative for confusion.    Physical Exam Updated Vital Signs BP (!) 121/55   Pulse 91   Temp 98.8 F (37.1 C) (Oral)   Resp 16   Ht 6\' 2"  (1.88 m)   Wt 80.3 kg   SpO2 98%   BMI 22.73 kg/m   Physical Exam Vitals and nursing note reviewed.  Cardiovascular:     Rate and Rhythm: Normal rate and regular rhythm.  Pulmonary:     Breath sounds: No wheezing or rhonchi.  Abdominal:     General: There is no distension. There are no signs of injury.     Tenderness: There is abdominal tenderness.     Comments: Mild upper abdominal tenderness  without rebound or guarding.  No hernias palpated.  Skin:    General: Skin is warm.     Capillary Refill: Capillary refill takes less than 2 seconds.  Neurological:     Mental Status: He is alert and oriented to person, place, and time.     ED Results / Procedures / Treatments   Labs (all labs ordered are listed, but only abnormal results are displayed) Labs Reviewed  COMPREHENSIVE METABOLIC PANEL - Abnormal; Notable for the following components:      Result Value   Glucose, Bld 130 (*)    BUN 81 (*)    Creatinine, Ser 5.47 (*)    Calcium 8.7 (*)    Albumin 2.8 (*)    GFR, Estimated 10 (*)    All other components within normal limits  LIPASE, BLOOD - Abnormal; Notable for the following components:   Lipase 73 (*)    All other components within normal limits  CBC WITH DIFFERENTIAL/PLATELET - Abnormal; Notable for the following components:   RBC 2.00 (*)    Hemoglobin 6.2 (*)    HCT 20.6 (*)    MCV 103.0 (*)    RDW 16.3 (*)    All other components within normal limits  TYPE AND SCREEN  PREPARE RBC (CROSSMATCH)    EKG EKG Interpretation  Date/Time:  Monday December 21 2020 08:30:37 EST Ventricular Rate:  92 PR Interval:    QRS Duration: 199 QT Interval:  451 QTC Calculation: 558  R Axis:   18 Text Interpretation: Sinus rhythm Atrial premature complex Right bundle branch block No significant change since last tracing Confirmed by [name] [phone]) on 12/21/2020 8:34:10 AM   Radiology DG Abdomen Acute W/Chest  Result Date: 12/21/2020 CLINICAL DATA:  Abdominal pain, vomiting EXAM: DG ABDOMEN ACUTE WITH 1 VIEW CHEST COMPARISON:  07/23/2019 FINDINGS: Prior CABG. Cardiomegaly. Aortic atherosclerosis. Areas of scarring in the upper lobes bilaterally. No acute confluent opacities or effusions. Prior cholecystectomy. Nonobstructive bowel gas pattern. No free air or organomegaly. Vascular calcifications noted. No visible aneurysm. IMPRESSION: No bowel obstruction or free  air. Cardiomegaly.  No acute cardiopulmonary disease. Electronically Signed   By: Rolm Baptise M.D.   On: 12/21/2020 09:41    Procedures Procedures   Medications Ordered in ED Medications  0.9 %  sodium chloride infusion (has no administration in time range)  ondansetron (ZOFRAN) injection 4 mg (4 mg Intravenous Given 12/21/20 0851)  fentaNYL (SUBLIMAZE) injection 50 mcg (50 mcg Intravenous Given 12/21/20 0910)    ED Course  I have reviewed the triage vital signs and the nursing notes.  Pertinent labs & imaging results that were available during my care of the patient were reviewed by me and considered in my medical decision making (see chart for details).    MDM Rules/Calculators/A&P                          Patient presents with hypotension and difficulty breathing at dialysis.  Upper abdominal pain.  Thinks he is anemic which she in fact is.  Requiring transfusion again.  I think patient benefit from admission to the hospital for further work-up of his recurrent GI bleeds and likely current GI bleed with anemia.  However patient is not willing to stay.  Patient gets upset that he has to have further treatment.  States he just wants some blood.  In order to try and minimize harm to the patient we will transfuse 1 unit and have discussed with Dr. Moshe Cipro from nephrology.  They will arrange to get him dialyzed tomorrow morning since he only had part of a treatment done today.  Hopefully they can arrange for more transfusions to.  Patient leaving AMA.  Care turned over to Dr. Tyrone Nine.  CRITICAL CARE Performed by: [name] Total critical care time: 30 minutes Critical care time was exclusive of separately billable procedures and treating other patients. Critical care was necessary to treat or prevent imminent or life-threatening deterioration. Critical care was time spent personally by me on the following activities: development of treatment plan with patient and/or surrogate as  well as nursing, discussions with consultants, evaluation of patient's response to treatment, examination of patient, obtaining history from patient or surrogate, ordering and performing treatments and interventions, ordering and review of laboratory studies, ordering and review of radiographic studies, pulse oximetry and re-evaluation of patient's condition.  Final Clinical Impression(s) / ED Diagnoses Final diagnoses:  Anemia, unspecified type  Gastrointestinal hemorrhage, unspecified gastrointestinal hemorrhage type  End stage renal disease on dialysis [facility])    Rx / DC Orders ED Discharge Orders    None       [name], MD 12/21/20 1539

## 2020-12-21 NOTE — ED Notes (Signed)
Lunch tray ordered 

## 2020-12-21 NOTE — ED Triage Notes (Signed)
Pt comes via [facility], pt left AMA several hours earlier for GI bleed. Received one unit and left, continues to have bleeding and SOB

## 2020-12-21 NOTE — ED Triage Notes (Signed)
Patient presents to ed via [facility] states he started having abd. Pain last pm  Tried to have a bowel movement around 1am was only able to have a small one with much straining drank some medication that his doctor had give him for the same at 3am went to dialysis this am was only able to stay on dialysis for 25mins because of the pain.

## 2020-12-21 NOTE — ED Provider Notes (Addendum)
See patient in signout from Dr. Alvino Chapel.  Briefly the patient is a 70 year old male on dialysis with a history of recurrent GI bleeds.  Found to have a GI bleed currently.  Patient is refusing to stay in the hospital.  Plan to transfuse 1 unit of blood and then discharge [facility].  Patient's unit of blood has completed and he is still unwilling to stay in the hospital.  We will have him follow-up with his dialysis center tomorrow.  The patient requested a discussion with me in the room as he does not feel he is leaving his medical advice.  I had a long discussion with him explaining that he had lost a significant amount of blood and with concern for GI bleed this could worsen causing him permanent death or disability and that the recommendation is to come to the hospital.  He was somewhat confused because he felt that his plan to follow-up with dialysis in the morning was consistent with the medical advice given.  I stressed that this is a alternative but not equal plan in accordance with safety and that he could get worse overnight.  He stated understanding but refused to sign AMA paperwork stating that he was not leaving [place].      Deno Etienne, DO 12/21/20 1614

## 2020-12-21 NOTE — ED Notes (Signed)
Lunch tray given. Appetite good.

## 2020-12-21 NOTE — Discharge Instructions (Signed)
You have been rescheduled for dialysis tomorrow.  Show up at [facility] for a 7:00 appointment.  You are leaving against our advice to have further work-up for the recurrent anemia.

## 2020-12-21 NOTE — ED Notes (Signed)
Patient got up off the stretcher and had a stool in the sink and all over the floor, instructed patient to please not get up and not to use the sink. Patient had been given a bedpan. Patient cleaned up and linens changed. Stool was black

## 2020-12-21 NOTE — Telephone Encounter (Signed)
Patient was just calling to let you know that he was at the ER. Stated that his Hemoglobin has dropped again. Stated that this is the 5th time in the ER. Stated that he was given iron on Friday and it didn't even last for the whole weekend. Stated that they are going to send him to GI.   FYI

## 2020-12-21 NOTE — ED Provider Notes (Signed)
[facility] EMERGENCY DEPARTMENT Provider Note   CSN: [id number] Arrival date & time: 12/21/20  2027     History Chief Complaint  Patient presents with  . GI Bleeding    Jacob [name] is a 70 y.o. male.  HPI Patient returns to the ED by EMS, following discharge about 4 hours prior, after being treated with 1 unit of packed red cells.  He returned because he is "bleeding and having trouble breathing.  He states that he had 2 bowel movements which were largely blood, after he went home earlier.  He states the blood was dripping onto his underwear.  He denies vomiting blood.  He denies fever, chills, cough, focal weakness or paresthesia.  There are no other known modifying factors.    Past Medical History:  Diagnosis Date  . Acute respiratory failure ([place]) 03/01/2018  . Arthritis    "all over; mostly knees and back" (02/28/2018)  . Chronic lower back pain    stenosis  . Community acquired pneumonia 09/06/2013  . COPD (chronic obstructive pulmonary disease) ([place])   . Coronary atherosclerosis of native coronary artery    a. 02/2003 s/p CABG x 2 (VG->RI, VG->RPDA; b. 11/2019 PCI: LM nl, LAD 90d, D3 50, RI 100, LCX 100p, OM3 100 - fills via L->L collats from D2/dLAD, RCA 100p, VG->RPDA ok, VG->RI 95 (3.5x48 Synergy XD DES).  . Drug abuse ([place])    hx; tested for cocaine as recently as 2/08. says he is not using drugs now - avoided defib. for this reason   . ESRD (end stage renal disease) ([place])    Hemo M-W-F- Richarda Blade  . Fall at home 10/2020  . GERD (gastroesophageal reflux disease)    takes OTC meds as needed  . GI bleeding    a. 11/2019 EGD: angiodysplastic lesions w/ bleeding s/p argon plasma/clipping/epi inj.  . Glaucoma    uses eye drops daily  . Hepatitis B 1968   "tx'd w/isolation; caught it from toilet stools in gym"  . HFrEF (heart failure with reduced ejection fraction) ([place])    a. 01/2019 Echo: EF 40-45%, diffuse HK, mild basal septal hypertrophy.  .  History of blood transfusion 03/01/2019  . History of colon polyps    benign  . History of gout    takes Allopurinol daily as well as Colchicine-if needed (02/28/2018)  . History of kidney stones   . HTN (hypertension)    takes Coreg,Imdur.and Apresoline daily  . Human immunodeficiency virus (HIV) disease ([place]) dx'd 1995   takes Genvoya daily  . Hyperlipidemia    takes Atorvastatin daily  . Ischemic cardiomyopathy    a. 01/2019 Echo: EF 40-45%, diffuse HK, mild basal septal hypertrophy. Diast dysfxn. Nl RV size/fxn. Sev dil LA. Triv MR/TR/PR.  [name] Muscle spasm    takes Zanaflex as needed  . Myocardial infarction ([place]) ~ 2004/2005  . Nocturia   . Peripheral neuropathy    takes gabapentin daily  . Pneumonia    "at least twice" (02/28/2018)  . Shortness of breath dyspnea    rarely but if notices it then with exertion  . Syphilis, unspecified   . Type II diabetes mellitus ([place]) 2004   Lantus daily.Average fasting blood sugar 125-199  . Wears glasses   . Wears partial dentures     Patient Active Problem List   Diagnosis Date Noted  . Acute upper GI bleed 11/24/2020  . COVID-19 virus infection 11/24/2020  . Subdural hematoma ([place]) 11/06/2020  . Fall  at home, initial encounter 11/05/2020  . Nicotine dependence, cigarettes, uncomplicated 82/95/6213  . Alcohol abuse 11/05/2020  . GERD without esophagitis 11/05/2020  . Unspecified trochanteric fracture of left femur, initial encounter for closed fracture ([place]) 11/05/2020  . Traumatic subdural hematoma, initial encounter ([place]) 11/05/2020  . Fracture of metatarsal of right foot, closed 11/05/2020  . Nondisplaced fracture of greater trochanter of left femur, initial encounter for closed fracture ([place])   . Uremia   . Acute GI bleeding 10/17/2020  . AVM (arteriovenous malformation) of small bowel, acquired with hemorrhage   . Fluid overload 10/16/2020  . Cardiomyopathy ([place]) 12/21/2019  . Iron deficiency anemia   . Acute blood loss anemia    . Acute on chronic anemia   . AVM (arteriovenous malformation) of duodenum, acquired   . Abnormal nuclear stress test 12/19/2019  . ESRD on hemodialysis ([place])   . Hand pain, left   . Cocaine abuse ([place])   . Hypertensive emergency   . CHF exacerbation ([place]) 07/21/2019  . Diabetic foot ulcer ([place]) 02/11/2019  . Gastric AVM   . Melena 02/07/2019  . CKD stage 4 due to type 2 diabetes mellitus ([place]) 05/30/2018  . Hyperlipidemia associated with type 2 diabetes mellitus ([place]) 05/30/2018  . Right foot ulcer ([place]) 02/28/2018  . Chronic obstructive pulmonary disease ([place]) 02/28/2018  . Anemia of chronic disease 11/20/2016  . CKD (chronic kidney disease) stage 3, GFR 30-59 ml/min (HCC) 11/20/2016  . CHF (congestive heart failure) ([place]) 11/10/2016  . History of lumbar laminectomy for spinal cord decompression 02/29/2016  . Type 2 diabetes mellitus with diabetic polyneuropathy, with long-term current use of insulin ([place]) 06/17/2015  . HTN (hypertension) 04/26/2015  . DM (diabetes mellitus) ([place]) 04/26/2015  . Ischemic cardiomyopathy 05/12/2014  . Ulcer of lower extremity ([place]) 09/06/2013  . Hyperkalemia 09/06/2013  . Chest pain 09/06/2013  . Type II or unspecified type diabetes mellitus with unspecified complication, not stated as uncontrolled 10/22/2012  . Bunion of left foot 11/25/2011  . Bunion, right foot 11/25/2011  . Polysubstance abuse ([place]) 07/28/2011  . Hip fracture, left ([place]) 04/04/2011  . Closed fracture of neck of femur ([place]) 04/04/2011  . Insomnia 02/18/2011  . Coronary artery disease involving native heart without angina pectoris 04/20/2009  . Chronic combined systolic (congestive) and diastolic (congestive) heart failure ([place]) 04/20/2009  . Mixed hyperlipidemia 11/20/2006  . Gout 11/20/2006  . TOBACCO ABUSE 11/20/2006  . Essential hypertension 11/20/2006  . Human immunodeficiency virus (HIV) disease ([place]) 09/02/2006    Past Surgical History:  Procedure Laterality Date   . AV FISTULA PLACEMENT Left 08/02/2018   Procedure: ARTERIOVENOUS (AV) FISTULA CREATION  left arm radiocephlic;  Surgeon: Marty Heck, MD;  Location: [place];  Service: Vascular;  Laterality: Left;  . AV FISTULA PLACEMENT Left 08/01/2019   Procedure: LEFT BRACHIOCEPHALIC ARTERIOVENOUS (AV) FISTULA CREATION;  Surgeon: [name], MD;  Location: [place];  Service: Vascular;  Laterality: Left;  . BASCILIC VEIN TRANSPOSITION Left 10/03/2019   Procedure: BASILIC VEIN TRANSPOSITION LEFT SECOND STAGE;  Surgeon: [name], MD;  Location: [place];  Service: Vascular;  Laterality: Left;  . CARDIAC CATHETERIZATION  10/2002; 12/19/2004   [name] 03/08/2011  . COLONOSCOPY  2013   Dr.John Henrene Pastor   . CORONARY ARTERY BYPASS GRAFT  02/24/2003   CABG X2/notes 03/08/2011  . CORONARY STENT INTERVENTION N/A 12/19/2019   Procedure: CORONARY STENT INTERVENTION;  Surgeon: Jettie Booze, MD;  Location: [place] CV LAB;  Service: Cardiovascular;  Laterality: N/A;  . ESOPHAGOGASTRODUODENOSCOPY (EGD) WITH PROPOFOL N/A 02/08/2019   Procedure: ESOPHAGOGASTRODUODENOSCOPY (EGD) WITH PROPOFOL;  Surgeon: Milus Banister, MD;  Location: [place];  Service: Gastroenterology;  Laterality: N/A;  . ESOPHAGOGASTRODUODENOSCOPY (EGD) WITH PROPOFOL N/A 12/22/2019   Procedure: ESOPHAGOGASTRODUODENOSCOPY (EGD) WITH PROPOFOL;  Surgeon: Lavena Bullion, DO;  Location: [place];  Service: Gastroenterology;  Laterality: N/A;  . ESOPHAGOGASTRODUODENOSCOPY (EGD) WITH PROPOFOL N/A 10/19/2020   Procedure: ESOPHAGOGASTRODUODENOSCOPY (EGD) WITH PROPOFOL;  Surgeon: Jackquline Denmark, MD;  Location: [facility] ENDOSCOPY;  Service: Endoscopy;  Laterality: N/A;  . HEMOSTASIS CLIP PLACEMENT  12/22/2019   Procedure: HEMOSTASIS CLIP PLACEMENT;  Surgeon: Lavena Bullion, DO;  Location: [place];  Service: Gastroenterology;;  . HOT HEMOSTASIS N/A 02/08/2019   Procedure: HOT HEMOSTASIS (ARGON PLASMA COAGULATION/BICAP);  Surgeon: Milus Banister, MD;  Location: [facility] ENDOSCOPY;  Service: Gastroenterology;  Laterality: N/A;  . HOT HEMOSTASIS N/A 12/22/2019   Procedure: HOT HEMOSTASIS (ARGON PLASMA COAGULATION/BICAP);  Surgeon: Lavena Bullion, DO;  Location: [facility] ENDOSCOPY;  Service: Gastroenterology;  Laterality: N/A;  . HOT HEMOSTASIS N/A 10/19/2020   Procedure: HOT HEMOSTASIS (ARGON PLASMA COAGULATION/BICAP);  Surgeon: Jackquline Denmark, MD;  Location[facility]COPY;  Service: Endoscopy;  Laterality: N/A;  . INTERTROCHANTERIC HIP FRACTURE SURGERY Left 11/2006   [name] 03/08/2011  . INTRAVASCULAR ULTRASOUND/IVUS N/A 12/19/2019   Procedure: Intravascular Ultrasound/IVUS;  Surgeon: Jettie Booze, MD;  Location: [place] CV LAB;  Service: Cardiovascular;  Laterality: N/A;  . IR FLUORO GUIDE CV LINE RIGHT  07/24/2019  . IR FLUORO GUIDE CV LINE RIGHT  07/30/2019  . IR US GUIDE VASC ACCESS RIGHT  07/24/2019  . IR US GUIDE VASC ACCESS RIGHT  07/30/2019  . LAPAROSCOPIC CHOLECYSTECTOMY  05/2006  . LIGATION OF COMPETING BRANCHES OF ARTERIOVENOUS FISTULA Left 11/05/2018   Procedure: LIGATION OF COMPETING BRANCHES OF ARTERIOVENOUS FISTULA  LEFT  ARM;  Surgeon: Marty Heck, MD;  Location: [place];  Service: Vascular;  Laterality: Left;  . LUMBAR LAMINECTOMY/DECOMPRESSION MICRODISCECTOMY N/A 02/29/2016   Procedure: Left L4-5 Lateral Recess Decompression, Removal Extradural Intraspinal Facet Cyst;  Surgeon: Marybelle Killings, MD;  Location: [place];  Service: Orthopedics;  Laterality: N/A;  . MULTIPLE TOOTH EXTRACTIONS    . ORIF MANDIBULAR FRACTURE Left 08/13/2004   ORIF of left body fracture mandible with KLS Martin 2.3-mm six hole/notes 03/08/2011  . RIGHT/LEFT HEART CATH AND CORONARY/GRAFT ANGIOGRAPHY N/A 12/19/2019   Procedure: RIGHT/LEFT HEART CATH AND CORONARY/GRAFT ANGIOGRAPHY;  Surgeon: Jettie Booze, MD;  Location: [place] CV LAB;  Service: Cardiovascular;  Laterality: N/A;  . SCLEROTHERAPY  12/22/2019   Procedure:  SCLEROTHERAPY;  Surgeon: Lavena Bullion, DO;  Location: [facility] ENDOSCOPY;  Service: Gastroenterology;;       Family History  Problem Relation Age of Onset  . Heart failure Father   . Hypertension Father   . Diabetes Brother   . Heart attack Brother   . Alzheimer's disease Mother   . Stroke Sister   . Diabetes Sister   . Alzheimer's disease Sister   . Hypertension Brother   . Diabetes Brother   . Drug abuse Brother   . Colon cancer Neg Hx     Social History   Tobacco Use  . Smoking status: Current Every Day Smoker    Packs/day: 0.50    Years: 43.00    Pack years: 21.50    Types: Cigarettes  . Smokeless tobacco: Never Used  Vaping Use  . Vaping Use: Never used  Substance Use  Topics  . Alcohol use: Not Currently    Alcohol/week: 12.0 standard drinks    Types: 12 Standard drinks or equivalent per week  . Drug use: Not Currently    Types: Cocaine    Comment: hx of crack/cocaine 90yrs ago 10/01/2019- none    Home Medications Prior to Admission medications   Medication Sig Start Date End Date Taking? Authorizing Provider  acetaminophen (TYLENOL) 650 MG CR tablet Take 650 mg by mouth 3 (three) times daily.    [provider]  allopurinol (ZYLOPRIM) 100 MG tablet TAKE 1 TABLET(100 MG) BY MOUTH DAILY 07/23/20   [name], NP  ANORO ELLIPTA 62.5-25 MCG/INH AEPB INHALE 1 PUFF BY MOUTH EVERY DAY 12/21/20   [name], NP  B-D UF III MINI PEN NEEDLES 31G X 5 MM MISC USE FOUR TIMES DAILY 03/02/20   [name], NP  bictegravir-emtricitabine-tenofovir AF (BIKTARVY) 50-200-25 MG TABS tablet Take 1 tablet by mouth daily. 06/30/20   [name], MD  carvedilol (COREG) 3.125 MG tablet Take 1 tablet (3.125 mg total) by mouth 2 (two) times daily. 02/13/20   [name], MD  Cinacalcet HCl (SENSIPAR PO) Take by mouth.  08/14/20 08/13/21  [provider]  colchicine 0.6 MG tablet Take 0.6-1.2 mg by mouth daily as needed (gout flare-up).    [provider]  diclofenac Sodium (VOLTAREN) 1 % GEL APPLY 2 GRAMS TO THE AFFECTED AREA THREE TIMES DAILY AS NEEDED FOR PAIN 11/16/20   [name], NP  folic acid (FOLVITE) 1 MG tablet Take 1 tablet (1 mg total) by mouth daily. 11/30/20   [name], NP  gabapentin (NEURONTIN) 300 MG capsule Take 1 capsule (300 mg total) by mouth daily. Dose change due to renal function 12/09/20   [name], NP  insulin lispro (HUMALOG KWIKPEN) 100 UNIT/ML KwikPen Inject 0.26ml (20 Units total) into the skin 3 (three) times daily as needed for high blood sugar (above 150) Patient taking differently: Inject 0.18ml (20 Units total) into the skin 3 (three) times daily as needed for high blood sugar (above 150) 03/12/20   [name], NP  iron sucrose in sodium chloride 0.9 % 100 mL Iron Sucrose (Venofer) 07/08/20 07/07/21  [provider]  isosorbide mononitrate (IMDUR) 30 MG 24 hr tablet TAKE 1 TABLET BY MOUTH EVERY DAY 08/10/20   [name], NP  Lancets (ONETOUCH DELICA PLUS FUXNAT55D) MISC Inject 1 Device as directed 3 (three) times daily. Dx: E11.40 09/18/19   [name], NP  latanoprost (XALATAN) 0.005 % ophthalmic solution Place 1 drop into both eyes at bedtime.    [provider]  Methoxy PEG-Epoetin Beta (MIRCERA IJ) Mircera 07/06/20   [provider]  multivitamin (RENA-VIT) TABS tablet Take 1 tablet by mouth daily.    [provider]  nitroGLYCERIN (NITROSTAT) 0.3 MG SL tablet ONE TABLET UNDER TONGUE AS NEEDED FOR CHEST PAIN 05/01/19   [name], MD  Nutritional Supplements (VITAMIN D BOOSTER PO) Take 1 each by mouth 2 (two) times daily. 08/12/20 08/11/21  [provider]  ONETOUCH ULTRA test strip CHECK BLOOD SUGAR 3 TIMES  DAILY 05/27/20   [name], NP  pantoprazole (PROTONIX) 40 MG tablet TAKE 1 TABLET(40 MG) BY MOUTH TWICE DAILY 10/19/20   Fayrene Helper, MD  polyethylene glycol (MIRALAX / GLYCOLAX) 17 g  packet Take 17 g by mouth daily as needed for mild constipation. 11/07/20   Aline August,  MD  sevelamer carbonate (RENVELA) 800 MG tablet Take 800 mg by mouth 3 (three) times daily with meals.    [provider]  ticagrelor (BRILINTA) 90 MG TABS tablet Take 90 mg by mouth 2 (two) times daily.    [provider]  timolol (TIMOPTIC) 0.5 % ophthalmic solution Place 1 drop into both eyes daily. 04/04/19   [provider]  traMADol (ULTRAM) 50 MG tablet Take 1 tablet (50 mg total) by mouth 2 (two) times daily as needed. 11/20/20   Marybelle Killings, MD    Allergies    Augmentin [amoxicillin-pot clavulanate], Mucinex fast-max, and Amphetamines  Review of Systems   Review of Systems  All other systems reviewed and are negative.   Physical Exam Updated Vital Signs BP (!) 90/50   Pulse 94   Temp 97.7 F (36.5 C) (Oral)   Resp (!) 22   SpO2 100%   Physical Exam Vitals and nursing note reviewed.  Constitutional:      General: He is not in acute distress.    Appearance: He is well-developed and well-nourished. He is ill-appearing. He is not toxic-appearing or diaphoretic.  HENT:     Head: Normocephalic and atraumatic.     Right Ear: External ear normal.     Left Ear: External ear normal.  Eyes:     Extraocular Movements: EOM normal.     Conjunctiva/sclera: Conjunctivae normal.     Pupils: Pupils are equal, round, and reactive to light.  Neck:     Trachea: Phonation normal.  Cardiovascular:     Rate and Rhythm: Regular rhythm. Tachycardia present.  Pulmonary:     Effort: Pulmonary effort is normal. No respiratory distress.     Breath sounds: No stridor. No wheezing.  Chest:     Chest wall: No bony tenderness.  Abdominal:     Palpations: Abdomen is soft.     Tenderness: There is no abdominal tenderness.  Genitourinary:    Comments: Anus is tender with exam but no deformity.  Brownish-red stool in rectal vault.  No fecal impaction.  No rectal  mass. Musculoskeletal:        General: Normal range of motion.     Cervical back: Normal range of motion and neck supple.  Skin:    General: Skin is warm, dry and intact.  Neurological:     Mental Status: He is alert and oriented to person, place, and time.     Cranial Nerves: No cranial nerve deficit.     Sensory: No sensory deficit.     Motor: No abnormal muscle tone.     Coordination: Coordination normal.  Psychiatric:        Mood and Affect: Mood and affect and mood normal.        Behavior: Behavior normal.        Thought Content: Thought content normal.        Judgment: Judgment normal.     ED Results / Procedures / Treatments   Labs (all labs ordered are listed, but only abnormal results are displayed) Labs Reviewed  CBC - Abnormal; Notable for the following components:      Result Value   RBC 1.95 (*)    Hemoglobin 6.0 (*)    HCT 19.3 (*)    RDW 19.0 (*)    Platelets 147 (*)    All other components within normal limits  BASIC METABOLIC PANEL - Abnormal; Notable for the following components:   BUN 121 (*)  Creatinine, Ser 7.43 (*)    GFR, Estimated 7 (*)    All other components within normal limits  POC OCCULT BLOOD, ED - Abnormal; Notable for the following components:   Fecal Occult Bld POSITIVE (*)    All other components within normal limits  RESP PANEL BY RT-PCR (FLU A&B, COVID) ARPGX2  TYPE AND SCREEN  PREPARE RBC (CROSSMATCH)    EKG None  Radiology DG Abdomen Acute W/Chest  Result Date: 12/21/2020 CLINICAL DATA:  Abdominal pain, vomiting EXAM: DG ABDOMEN ACUTE WITH 1 VIEW CHEST COMPARISON:  07/23/2019 FINDINGS: Prior CABG. Cardiomegaly. Aortic atherosclerosis. Areas of scarring in the upper lobes bilaterally. No acute confluent opacities or effusions. Prior cholecystectomy. Nonobstructive bowel gas pattern. No free air or organomegaly. Vascular calcifications noted. No visible aneurysm. IMPRESSION: No bowel obstruction or free air. Cardiomegaly.  No  acute cardiopulmonary disease. Electronically Signed   By: Rolm Baptise M.D.   On: 12/21/2020 09:41    Procedures .Critical Care Performed by: Daleen Bo, MD Authorized by: Daleen Bo, MD   Critical care provider statement:    Critical care time (minutes):  35   Critical care start time:  12/21/2020 9:40 PM   Critical care end time:  12/21/2020 10:36 PM   Critical care time was exclusive of:  Separately billable procedures and treating other patients   Critical care was necessary to treat or prevent imminent or life-threatening deterioration of the following conditions:  Circulatory failure   Critical care was time spent personally by me on the following activities:  Blood draw for specimens, development of treatment plan with patient or surrogate, discussions with consultants, evaluation of patient's response to treatment, examination of patient, obtaining history from patient or surrogate, ordering and performing treatments and interventions, ordering and review of laboratory studies, pulse oximetry, re-evaluation of patient's condition, review of old charts and ordering and review of radiographic studies     Medications Ordered in ED Medications  0.9 %  sodium chloride infusion (10 mL/hr Intravenous New Bag/Given 12/21/20 2249)    ED Course  I have reviewed the triage vital signs and the nursing notes.  Pertinent labs & imaging results that were available during my care of the patient were reviewed by me and considered in my medical decision making (see chart for details).    MDM Rules/Calculators/A&P                           Patient Vitals for the past 24 hrs:  BP Temp Temp src Pulse Resp SpO2  12/21/20 2250 (!) 90/50 97.7 F (36.5 C) Oral 94 (!) 22 100 %  12/21/20 2245 (!) 90/50 -- -- -- (!) 24 --  12/21/20 2230 (!) 99/46 -- -- -- 19 --  12/21/20 2215 (!) 98/49 -- -- -- (!) 31 --  12/21/20 2200 (!) 90/44 -- -- -- 15 --  12/21/20 2145 (!) 90/46 -- -- -- (!) 24 --   12/21/20 2130 (!) 94/51 -- -- -- 14 --  12/21/20 2115 (!) 98/54 -- -- -- (!) 23 --  12/21/20 2045 (!) 109/49 97.7 F (36.5 C) Oral 100 (!) 23 100 %    10:52 PM Reevaluation with update and discussion. After initial assessment and treatment, an updated evaluation reveals no change in status, 5 discussed with the patient all questions were answered. Daleen Bo   Medical Decision Making:  This patient is presenting for evaluation of ongoing GI bleed, which does require a range  of treatment options, and is a complaint that involves a moderate risk of morbidity and mortality. The differential diagnoses include blood loss, decreased blood production. I decided to review old records, and in summary elderly patient on dialysis presenting with intermittent rectal bleeding, and anemia.  He left AMA prior to evaluation earlier with ED recommendation for admission.  I did not require additional historical information from anyone.  Clinical Laboratory Tests Ordered, included CBC. Review indicates anemia.       Specimen of stool indicating brown color with blood. .  Critical Interventions-clinical evaluation, laboratory testing, blood transfusion, observation and reassessment  After These Interventions, the Patient was reevaluated and was found with persistent anemia despite blood transfusion today.  BUN higher than baseline indicating upper GI bleed.  Stool is not bright red indicating higher bleed.  Patient reports having upper endoscopy endoscopy in the past.  Upper endoscopy indicated angiodysplastic lesions in the gastric fundus.  His endoscopist was Dr. Lyndel Safe.  He has recently been referred to GI for GI bleeding, but not done that yet  CRITICAL CARE-yes Performed by: Daleen Bo  Nursing Notes Reviewed/ Care Coordinated Applicable Imaging Reviewed Interpretation of Laboratory Data incorporated into ED treatment   10:53 PM-Consult complete with hospitalist. Patient case explained and  discussed.  He agrees to admit patient for further evaluation and treatment. Call ended at 10:52 PM    Final Clinical Impression(s) / ED Diagnoses Final diagnoses:  Symptomatic anemia  End stage renal disease ([place])    Rx / DC Orders ED Discharge Orders    None       Daleen Bo, MD 12/21/20 2253

## 2020-12-21 NOTE — ED Notes (Signed)
Transported to u/s

## 2020-12-21 NOTE — Telephone Encounter (Signed)
Pharmacy sent message stating that they did not receive previous Rx. Fax sent 12/19/2020 stating "we did not receive rx on 12/17/2020"   Pended Rx and sent to [facility] for approval due to [place].

## 2020-12-22 ENCOUNTER — Inpatient Hospital Stay (HOSPITAL_COMMUNITY): Payer: Medicare Other | Admitting: Certified Registered Nurse Anesthetist

## 2020-12-22 ENCOUNTER — Encounter (HOSPITAL_COMMUNITY)
Admission: EM | Disposition: A | Discharge status: Home / Self Care | Payer: Self-pay | Source: Home / Self Care | Attending: Internal Medicine

## 2020-12-22 ENCOUNTER — Other Ambulatory Visit: Payer: Self-pay

## 2020-12-22 ENCOUNTER — Encounter (HOSPITAL_COMMUNITY): Payer: Self-pay | Admitting: Internal Medicine

## 2020-12-22 DIAGNOSIS — N186 End stage renal disease: Secondary | ICD-10-CM | POA: Diagnosis not present

## 2020-12-22 DIAGNOSIS — K31819 Angiodysplasia of stomach and duodenum without bleeding: Secondary | ICD-10-CM | POA: Diagnosis not present

## 2020-12-22 DIAGNOSIS — K3189 Other diseases of stomach and duodenum: Secondary | ICD-10-CM | POA: Diagnosis not present

## 2020-12-22 DIAGNOSIS — D631 Anemia in chronic kidney disease: Secondary | ICD-10-CM | POA: Diagnosis not present

## 2020-12-22 DIAGNOSIS — D649 Anemia, unspecified: Secondary | ICD-10-CM | POA: Diagnosis not present

## 2020-12-22 DIAGNOSIS — Z7951 Long term (current) use of inhaled steroids: Secondary | ICD-10-CM | POA: Diagnosis not present

## 2020-12-22 DIAGNOSIS — J449 Chronic obstructive pulmonary disease, unspecified: Secondary | ICD-10-CM | POA: Diagnosis present

## 2020-12-22 DIAGNOSIS — Z888 Allergy status to other drugs, medicaments and biological substances status: Secondary | ICD-10-CM | POA: Diagnosis not present

## 2020-12-22 DIAGNOSIS — Z79899 Other long term (current) drug therapy: Secondary | ICD-10-CM | POA: Diagnosis not present

## 2020-12-22 DIAGNOSIS — I5042 Chronic combined systolic (congestive) and diastolic (congestive) heart failure: Secondary | ICD-10-CM | POA: Diagnosis not present

## 2020-12-22 DIAGNOSIS — Z992 Dependence on renal dialysis: Secondary | ICD-10-CM | POA: Diagnosis not present

## 2020-12-22 DIAGNOSIS — Z7902 Long term (current) use of antithrombotics/antiplatelets: Secondary | ICD-10-CM | POA: Diagnosis not present

## 2020-12-22 DIAGNOSIS — Z20822 Contact with and (suspected) exposure to covid-19: Secondary | ICD-10-CM | POA: Diagnosis not present

## 2020-12-22 DIAGNOSIS — R392 Extrarenal uremia: Secondary | ICD-10-CM | POA: Diagnosis not present

## 2020-12-22 DIAGNOSIS — N2581 Secondary hyperparathyroidism of renal origin: Secondary | ICD-10-CM | POA: Diagnosis not present

## 2020-12-22 DIAGNOSIS — Z794 Long term (current) use of insulin: Secondary | ICD-10-CM | POA: Diagnosis not present

## 2020-12-22 DIAGNOSIS — B20 Human immunodeficiency virus [HIV] disease: Secondary | ICD-10-CM | POA: Diagnosis present

## 2020-12-22 DIAGNOSIS — K31811 Angiodysplasia of stomach and duodenum with bleeding: Secondary | ICD-10-CM | POA: Diagnosis not present

## 2020-12-22 DIAGNOSIS — Z955 Presence of coronary angioplasty implant and graft: Secondary | ICD-10-CM | POA: Diagnosis not present

## 2020-12-22 DIAGNOSIS — I132 Hypertensive heart and chronic kidney disease with heart failure and with stage 5 chronic kidney disease, or end stage renal disease: Secondary | ICD-10-CM | POA: Diagnosis not present

## 2020-12-22 DIAGNOSIS — R0602 Shortness of breath: Secondary | ICD-10-CM | POA: Diagnosis not present

## 2020-12-22 DIAGNOSIS — D62 Acute posthemorrhagic anemia: Secondary | ICD-10-CM | POA: Diagnosis not present

## 2020-12-22 DIAGNOSIS — K219 Gastro-esophageal reflux disease without esophagitis: Secondary | ICD-10-CM | POA: Diagnosis not present

## 2020-12-22 DIAGNOSIS — I959 Hypotension, unspecified: Secondary | ICD-10-CM | POA: Diagnosis present

## 2020-12-22 DIAGNOSIS — K922 Gastrointestinal hemorrhage, unspecified: Secondary | ICD-10-CM | POA: Diagnosis present

## 2020-12-22 DIAGNOSIS — K921 Melena: Secondary | ICD-10-CM

## 2020-12-22 DIAGNOSIS — Z881 Allergy status to other antibiotic agents status: Secondary | ICD-10-CM | POA: Diagnosis not present

## 2020-12-22 DIAGNOSIS — I120 Hypertensive chronic kidney disease with stage 5 chronic kidney disease or end stage renal disease: Secondary | ICD-10-CM | POA: Diagnosis not present

## 2020-12-22 DIAGNOSIS — D500 Iron deficiency anemia secondary to blood loss (chronic): Secondary | ICD-10-CM | POA: Diagnosis not present

## 2020-12-22 DIAGNOSIS — E1142 Type 2 diabetes mellitus with diabetic polyneuropathy: Secondary | ICD-10-CM | POA: Diagnosis present

## 2020-12-22 DIAGNOSIS — Z951 Presence of aortocoronary bypass graft: Secondary | ICD-10-CM | POA: Diagnosis not present

## 2020-12-22 DIAGNOSIS — E1122 Type 2 diabetes mellitus with diabetic chronic kidney disease: Secondary | ICD-10-CM | POA: Diagnosis not present

## 2020-12-22 DIAGNOSIS — I2510 Atherosclerotic heart disease of native coronary artery without angina pectoris: Secondary | ICD-10-CM | POA: Diagnosis not present

## 2020-12-22 DIAGNOSIS — E785 Hyperlipidemia, unspecified: Secondary | ICD-10-CM | POA: Diagnosis not present

## 2020-12-22 DIAGNOSIS — Z8616 Personal history of COVID-19: Secondary | ICD-10-CM | POA: Diagnosis not present

## 2020-12-22 HISTORY — PX: ESOPHAGOGASTRODUODENOSCOPY (EGD) WITH PROPOFOL: SHX5813

## 2020-12-22 HISTORY — PX: HOT HEMOSTASIS: SHX5433

## 2020-12-22 HISTORY — PX: HEMOSTASIS CLIP PLACEMENT: SHX6857

## 2020-12-22 HISTORY — PX: HEMOSTASIS CONTROL: SHX6838

## 2020-12-22 LAB — BASIC METABOLIC PANEL
Anion gap: 15 (ref 5–15)
BUN: 135 mg/dL — ABNORMAL HIGH (ref 8–23)
CO2: 23 mmol/L (ref 22–32)
Calcium: 9 mg/dL (ref 8.9–10.3)
Chloride: 102 mmol/L (ref 98–111)
Creatinine, Ser: 7.37 mg/dL — ABNORMAL HIGH (ref 0.61–1.24)
GFR, Estimated: 7 mL/min — ABNORMAL LOW (ref >60)
Glucose, Bld: 109 mg/dL — ABNORMAL HIGH (ref 70–99)
Potassium: 4.2 mmol/L (ref 3.5–5.1)
Sodium: 140 mmol/L (ref 135–145)

## 2020-12-22 LAB — CBC
HCT: 20.9 % — ABNORMAL LOW (ref 39.0–52.0)
Hemoglobin: 6.5 g/dL — CL (ref 13.0–17.0)
MCH: 30.5 pg (ref 26.0–34.0)
MCHC: 31.1 g/dL (ref 30.0–36.0)
MCV: 98.1 fL (ref 80.0–100.0)
Platelets: 129 10*3/uL — ABNORMAL LOW (ref 150–400)
RBC: 2.13 MIL/uL — ABNORMAL LOW (ref 4.22–5.81)
RDW: 18.9 % — ABNORMAL HIGH (ref 11.5–15.5)
WBC: 7.6 10*3/uL (ref 4.0–10.5)
nRBC: 0 % (ref 0.0–0.2)

## 2020-12-22 LAB — TYPE AND SCREEN
ABO/RH(D): A POS
Antibody Screen: NEGATIVE
Unit division: 0
Unit division: 0

## 2020-12-22 LAB — BPAM RBC
Blood Product Expiration Date: 202203222359
Blood Product Expiration Date: 202203222359
ISSUE DATE / TIME: 202202281254
ISSUE DATE / TIME: 202202281752
Unit Type and Rh: 6200
Unit Type and Rh: 6200

## 2020-12-22 LAB — RESP PANEL BY RT-PCR (FLU A&B, COVID) ARPGX2
Influenza A by PCR: NEGATIVE
Influenza B by PCR: NEGATIVE
SARS Coronavirus 2 by RT PCR: NEGATIVE

## 2020-12-22 LAB — HEMOGLOBIN AND HEMATOCRIT, BLOOD
HCT: 18.3 % — ABNORMAL LOW (ref 39.0–52.0)
Hemoglobin: 6.1 g/dL — CL (ref 13.0–17.0)

## 2020-12-22 LAB — PREPARE RBC (CROSSMATCH)

## 2020-12-22 LAB — GLUCOSE, CAPILLARY
Glucose-Capillary: 104 mg/dL — ABNORMAL HIGH (ref 70–99)
Glucose-Capillary: 107 mg/dL — ABNORMAL HIGH (ref 70–99)
Glucose-Capillary: 114 mg/dL — ABNORMAL HIGH (ref 70–99)

## 2020-12-22 LAB — CBG MONITORING, ED
Glucose-Capillary: 146 mg/dL — ABNORMAL HIGH (ref 70–99)
Glucose-Capillary: 148 mg/dL — ABNORMAL HIGH (ref 70–99)

## 2020-12-22 SURGERY — ESOPHAGOGASTRODUODENOSCOPY (EGD) WITH PROPOFOL
Anesthesia: Monitor Anesthesia Care

## 2020-12-22 MED ORDER — ZOLPIDEM TARTRATE 5 MG PO TABS
5.0000 mg | ORAL_TABLET | Freq: Once | ORAL | Status: AC
  Administered 2020-12-22: 5 mg via ORAL
  Filled 2020-12-22: qty 1

## 2020-12-22 MED ORDER — UMECLIDINIUM-VILANTEROL 62.5-25 MCG/INH IN AEPB
1.0000 | INHALATION_SPRAY | Freq: Every day | RESPIRATORY_TRACT | Status: DC
  Administered 2020-12-23 – 2020-12-24 (×2): 1 via RESPIRATORY_TRACT
  Filled 2020-12-22: qty 14

## 2020-12-22 MED ORDER — LIDOCAINE HCL (PF) 1 % IJ SOLN: 5.0000 mL | INTRAMUSCULAR | Status: DC | PRN

## 2020-12-22 MED ORDER — SODIUM CHLORIDE 0.9% IV SOLUTION: Freq: Once | INTRAVENOUS | Status: AC

## 2020-12-22 MED ORDER — PANTOPRAZOLE SODIUM 40 MG IV SOLR
40.0000 mg | Freq: Two times a day (BID) | INTRAVENOUS | Status: DC
  Administered 2020-12-22 (×3): 40 mg via INTRAVENOUS
  Filled 2020-12-22 (×3): qty 40

## 2020-12-22 MED ORDER — HEPARIN SODIUM (PORCINE) 1000 UNIT/ML DIALYSIS
1000.0000 [IU] | INTRAMUSCULAR | Status: DC | PRN
  Filled 2020-12-22: qty 1

## 2020-12-22 MED ORDER — THIAMINE HCL 100 MG/ML IJ SOLN
100.0000 mg | Freq: Every day | INTRAMUSCULAR | Status: DC
  Administered 2020-12-22 – 2020-12-23 (×2): 100 mg via INTRAVENOUS
  Filled 2020-12-22 (×2): qty 2

## 2020-12-22 MED ORDER — PANTOPRAZOLE SODIUM 40 MG IV SOLR: 40.0000 mg | Freq: Once | INTRAVENOUS | Status: DC

## 2020-12-22 MED ORDER — LATANOPROST 0.005 % OP SOLN
1.0000 [drp] | Freq: Every day | OPHTHALMIC | Status: DC
  Administered 2020-12-23: 1 [drp] via OPHTHALMIC
  Filled 2020-12-22 (×2): qty 2.5

## 2020-12-22 MED ORDER — PROPOFOL 500 MG/50ML IV EMUL
INTRAVENOUS | Status: DC | PRN
  Administered 2020-12-22: 50 ug/kg/min via INTRAVENOUS

## 2020-12-22 MED ORDER — SODIUM CHLORIDE 0.9% IV SOLUTION: Freq: Once | INTRAVENOUS | Status: DC

## 2020-12-22 MED ORDER — TIMOLOL MALEATE 0.5 % OP SOLN
1.0000 [drp] | Freq: Every day | OPHTHALMIC | Status: DC
  Administered 2020-12-22 – 2020-12-24 (×3): 1 [drp] via OPHTHALMIC
  Filled 2020-12-22 (×2): qty 5

## 2020-12-22 MED ORDER — SODIUM CHLORIDE 0.9 % IV SOLN: 100.0000 mL | INTRAVENOUS | Status: DC | PRN

## 2020-12-22 MED ORDER — LIDOCAINE-PRILOCAINE 2.5-2.5 % EX CREA
1.0000 "application " | TOPICAL_CREAM | CUTANEOUS | Status: DC | PRN
  Filled 2020-12-22: qty 5

## 2020-12-22 MED ORDER — LIDOCAINE 2% (20 MG/ML) 5 ML SYRINGE
INTRAMUSCULAR | Status: DC | PRN
  Administered 2020-12-22: 60 mg via INTRAVENOUS

## 2020-12-22 MED ORDER — CHLORHEXIDINE GLUCONATE CLOTH 2 % EX PADS
6.0000 | MEDICATED_PAD | Freq: Every day | CUTANEOUS | Status: DC
  Administered 2020-12-22: 6 via TOPICAL

## 2020-12-22 MED ORDER — PANTOPRAZOLE SODIUM 40 MG IV SOLR: 40.0000 mg | Freq: Two times a day (BID) | INTRAVENOUS | Status: DC

## 2020-12-22 MED ORDER — INSULIN ASPART 100 UNIT/ML ~~LOC~~ SOLN
0.0000 [IU] | SUBCUTANEOUS | Status: DC
  Administered 2020-12-23: 2 [IU] via SUBCUTANEOUS
  Administered 2020-12-23: 1 [IU] via SUBCUTANEOUS

## 2020-12-22 MED ORDER — PENTAFLUOROPROP-TETRAFLUOROETH EX AERO
1.0000 "application " | INHALATION_SPRAY | CUTANEOUS | Status: DC | PRN
  Filled 2020-12-22: qty 116

## 2020-12-22 MED ORDER — ALTEPLASE 2 MG IJ SOLR
2.0000 mg | Freq: Once | INTRAMUSCULAR | Status: DC | PRN
  Filled 2020-12-22: qty 2

## 2020-12-22 MED ORDER — SODIUM CHLORIDE 0.9 % IV SOLN
8.0000 mg/h | INTRAVENOUS | Status: DC
  Administered 2020-12-23 (×2): 8 mg/h via INTRAVENOUS
  Filled 2020-12-22 (×3): qty 80

## 2020-12-22 MED ORDER — PROPOFOL 10 MG/ML IV BOLUS
INTRAVENOUS | Status: DC | PRN
  Administered 2020-12-22: 30 mg via INTRAVENOUS

## 2020-12-22 MED ORDER — PHENYLEPHRINE HCL-NACL 10-0.9 MG/250ML-% IV SOLN
INTRAVENOUS | Status: DC | PRN
  Administered 2020-12-22: 40 ug/min via INTRAVENOUS

## 2020-12-22 MED ORDER — SODIUM CHLORIDE 0.9 % IV SOLN
100.0000 mL | INTRAVENOUS | Status: DC | PRN
  Administered 2020-12-23: 100 mL via INTRAVENOUS

## 2020-12-22 SURGICAL SUPPLY — 14 items

## 2020-12-22 NOTE — Progress Notes (Signed)
PROGRESS NOTE    Jacob [name]  AYT:[id number] DOB: [birth year] DOA: 12/21/2020 PCP: [name], NP    Brief Narrative:  Jacob [name] is a 70 y.o. male with history of recurrent GI bleed 2/2 AVMs, CAD on Brilinta, HIV, ESRD on HD MWF, essential hypertension, COPD, type 2 diabetes mellitus, Hx Covid-19 viral infection who presented to the ED with frank rectal bleeding. Patient was seen in the ED earlier that day in which his hemoglobin was found to be six he was transfused 1 unit PRBC and he left AGAINST MEDICAL ADVICE. Patient then returned to the ED after multiple episodes of blood in the stool. Patient denies any abdominal pain, no nausea/vomiting, no chest pain, no shortness of breath.  Patient admitted December 2021 with EGD showing gastric AVMs which were cauterized. Bleeding complicated by use of Brilinta.  In the ER, 7.7 F, HR 100, RR 23, BP 90/46, SPO2 100% on room air. 141, potassium 3.8, chloride 101, CO2 26, glucose 130, BUN 81, creatinine 5.47. Lipase 73, AST 16, ALT nine, total bilirubin 0.8. WBC 7.5, hemoglobin 6.2, MCV 113.0, platelets 168. FOBT positive. Acute abdominal series with no obstruction or free air, cardiomegaly and no acute cardiopulmonary disease process. Covid-19/influenza A/B PCR negative. Patient was transfused 1 unit PRBC. Gastroenterology was consulted. Hospital service consulted for further evaluation and management of acute GI bleed likely secondary to gastric AVMs.   Assessment & Plan:   Principal Problem:   Acute GI bleeding Active Problems:   Human immunodeficiency virus (HIV) disease ([place])   Essential hypertension   Coronary artery disease involving native heart without angina pectoris   Type 2 diabetes mellitus with diabetic polyneuropathy, with long-term current use of insulin (HCC)   Gastric AVM   ESRD on hemodialysis ([place])   GI bleed   GI bleed likely secondary to AVMs, recurrent Patient representing to the ED with bloody stools,  low been 6.2. History of AVMs requiring cauterization in December 2021. This is complicated by use of Brilinta.  --[facility] following; appreciate assistance --s/p 1u pRBC 3/1; transfuse 2 additional units --Repeat hemoglobin following transfusion --Protonix 40 mg IV q12h --Holding Brilinta --Plan EGD today, n.p.o. --Transfuse for hemoglobin <8.0 given cardiac history  Essential hypertension CAD s/p DES Patient underwent heart catheterization 12/19/2019 with DES placements. Continues on Brilinta outpatient. Hypotensive on arrival likely secondary to GI bleed. --Hold home carvedilol 3.125mg  BID and isosorbide mononitrate 30 mg PO daily --Transfusion as above. --Otila Kluver monitor blood pressure closely; currently asymptomatic with mild hypotension  COPD Continue Anoro Ellipta 1 puff daily  ESRD on HD Dialyzes on a Monday/Wednesday/Friday schedule. Partial HD session on 12/21/20. Potassium within normal limits, BUN 355; complicated by GI bleed as above. --Nephrology consulted to continue HD while inpatient  Type 2 diabetes mellitus Hemoglobin A1c 4.6 on 12/15/2020, well controlled. Home regimen includes Humalog 20 units TID prn for glucose above 150. --SSI for coverage --CBDs qAC/HS  HIV CD4 count 36 06/30/2020. Follows with ID, Dr. [name] outpatient. --Will restart Biktarvy following EGD  History of Covid-19 viral infection Patient tested positive for Covid-19 on 11/24/2020, was asymptomatic. Repeat Covid-19 PCR on admission negative. Oxygenating well on room air. Is fully vaccinated against Covid-19 with [employer] vaccination.    DVT prophylaxis: SCDs, chemical DVT prophylaxis contraindicated in acute GI bleed   Code Status: Full Code Family Communication: Updated patient extensively at bedside, no family present.  Disposition Plan:  Level of care: Telemetry Medical Status is: Inpatient  Remains inpatient appropriate  because:Ongoing diagnostic testing needed not appropriate for  outpatient work up, Unsafe d/c plan, IV treatments appropriate due to intensity of illness or inability to take PO and Inpatient level of care appropriate due to severity of illness   Dispo: The patient is from: Home              Anticipated d/c is to: Home              Patient currently is not medically stable to d/c.   Difficult to place patient No   Consultants:   [facility]  Procedures:   None  Antimicrobials:   None   Subjective: Patient seen and examined bedside, resting comfortably. Angry that he cannot have anything to eat or drink. Discussed with him extensively that he continues with blood in the stools and is pending EGD later today. Patient upset with this and states his stomach is hurting in which she wants to eat and drink right now. Instructed patient that we will not be ordering any food or drink until evaluated by GI physician especially with pending EGD later today. No other complaints or concerns at this time. Denies headache, no chest pain, no palpitations, no shortness of breath. No acute concerns this morning per nursing staff.  Objective: Vitals:   12/22/20 1025 12/22/20 1030 12/22/20 1045 12/22/20 1100  BP:    (!) 87/46  Pulse:    98  Resp:  16 (!) 31 18  Temp:      TempSrc:      SpO2: 100%   100%    Intake/Output Summary (Last 24 hours) at 12/22/2020 1136 Last data filed at 12/22/2020 0142 Gross per 24 hour  Intake 723 ml  Output --  Net 723 ml   There were no vitals filed for this visit.  Examination:  General exam: Slightly irritated/agitated, chronically ill in appearance Respiratory system: Clear to auscultation. Respiratory effort normal. Oxygenating well on room air Cardiovascular system: S1 & S2 heard, RRR. No JVD, murmurs, rubs, gallops or clicks. No pedal edema. Gastrointestinal system: Abdomen is nondistended, soft and nontender. No organomegaly or masses felt. Normal bowel sounds heard. Central nervous system: Alert and  oriented. No focal neurological deficits. Extremities: Symmetric 5 x 5 power. Skin: No rashes, lesions or ulcers Psychiatry: Judgement and insight appear poor. Mood & affect appropriate.     Data Reviewed: I have personally reviewed following labs and imaging studies  CBC: Recent Labs  Lab 12/21/20 0834 12/21/20 2052 12/22/20 0223 12/22/20 0932  WBC 7.5 7.1 7.6  --   NEUTROABS 5.8  --   --   --   HGB 6.2* 6.0* 6.5* 6.1*  HCT 20.6* 19.3* 20.9* 18.3*  MCV 103.0* 99.0 98.1  --   PLT 168 147* 129*  --    Basic Metabolic Panel: Recent Labs  Lab 12/21/20 0834 12/21/20 2052 12/22/20 0223  NA 141 142 140  K 3.8 4.1 4.2  CL 101 103 102  CO2 26 24 23   GLUCOSE 130* 85 109*  BUN 81* 121* 135*  CREATININE 5.47* 7.43* 7.37*  CALCIUM 8.7* 9.1 9.0   GFR: Estimated Creatinine Clearance: 10.4 mL/min (A) (by C-G formula based on SCr of 7.37 mg/dL (H)). Liver Function Tests: Recent Labs  Lab 12/21/20 0834  AST 16  ALT 9  ALKPHOS 79  BILITOT 0.8  PROT 6.8  ALBUMIN 2.8*   Recent Labs  Lab 12/21/20 0834  LIPASE 73*   No results for input(s): AMMONIA in  the last 168 hours. Coagulation Profile: No results for input(s): INR, PROTIME in the last 168 hours. Cardiac Enzymes: No results for input(s): CKTOTAL, CKMB, CKMBINDEX, TROPONINI in the last 168 hours. BNP (last 3 results) No results for input(s): PROBNP in the last 8760 hours. HbA1C: No results for input(s): HGBA1C in the last 72 hours. CBG: Recent Labs  Lab 12/22/20 0818  GLUCAP 146*   Lipid Profile: No results for input(s): CHOL, HDL, LDLCALC, TRIG, CHOLHDL, LDLDIRECT in the last 72 hours. Thyroid Function Tests: No results for input(s): TSH, T4TOTAL, FREET4, T3FREE, THYROIDAB in the last 72 hours. Anemia Panel: No results for input(s): VITAMINB12, FOLATE, FERRITIN, TIBC, IRON, RETICCTPCT in the last 72 hours. Sepsis Labs: No results for input(s): PROCALCITON, LATICACIDVEN in the last 168 hours.  Recent  Results (from the past 240 hour(s))  Resp Panel by RT-PCR (Flu A&B, Covid) Nasopharyngeal Swab     Status: None   Collection Time: 12/21/20  9:28 PM   Specimen: Nasopharyngeal Swab; Nasopharyngeal(NP) swabs in vial transport medium  Result Value Ref Range Status   SARS Coronavirus 2 by RT PCR NEGATIVE NEGATIVE Final    Comment: (NOTE) SARS-CoV-2 target nucleic acids are NOT DETECTED.  The SARS-CoV-2 RNA is generally detectable in upper respiratory specimens during the acute phase of infection. The lowest concentration of SARS-CoV-2 viral copies this assay can detect is 138 copies/mL. A negative result does not preclude SARS-Cov-2 infection and should not be used as the sole basis for treatment or other patient management decisions. A negative result may occur with  improper specimen collection/handling, submission of specimen other than nasopharyngeal swab, presence of viral mutation(s) within the areas targeted by this assay, and inadequate number of viral copies(<138 copies/mL). A negative result must be combined with clinical observations, patient history, and epidemiological information. The expected result is Negative.  Fact Sheet for Patients:  EntrepreneurPulse.com.au  Fact Sheet for Healthcare Providers:  IncredibleEmployment.be  This test is no t yet approved or cleared by the [place] FDA and  has been authorized for detection and/or diagnosis of SARS-CoV-2 by FDA under an Emergency Use Authorization (EUA). This EUA will remain  in effect (meaning this test can be used) for the duration of the COVID-19 declaration under Section 564(b)(1) of the Act, 21 U.S.C.section 360bbb-3(b)(1), unless the authorization is terminated  or revoked sooner.       Influenza A by PCR NEGATIVE NEGATIVE Final   Influenza B by PCR NEGATIVE NEGATIVE Final    Comment: (NOTE) The Xpert Xpress SARS-CoV-2/FLU/RSV plus assay is intended as an aid in the  diagnosis of influenza from Nasopharyngeal swab specimens and should not be used as a sole basis for treatment. Nasal washings and aspirates are unacceptable for Xpert Xpress SARS-CoV-2/FLU/RSV testing.  Fact Sheet for Patients: EntrepreneurPulse.com.au  Fact Sheet for Healthcare Providers: IncredibleEmployment.be  This test is not yet approved or cleared by the [place] FDA and has been authorized for detection and/or diagnosis of SARS-CoV-2 by FDA under an Emergency Use Authorization (EUA). This EUA will remain in effect (meaning this test can be used) for the duration of the COVID-19 declaration under Section 564(b)(1) of the Act, 21 U.S.C. section 360bbb-3(b)(1), unless the authorization is terminated or revoked.  Performed at [facility], [place] [address] [place], [place] [address]          Radiology Studies: DG Abdomen Acute W/Chest  Result Date: 12/21/2020 CLINICAL DATA:  Abdominal pain, vomiting EXAM: DG ABDOMEN ACUTE WITH 1 VIEW CHEST  COMPARISON:  07/23/2019 FINDINGS: Prior CABG. Cardiomegaly. Aortic atherosclerosis. Areas of scarring in the upper lobes bilaterally. No acute confluent opacities or effusions. Prior cholecystectomy. Nonobstructive bowel gas pattern. No free air or organomegaly. Vascular calcifications noted. No visible aneurysm. IMPRESSION: No bowel obstruction or free air. Cardiomegaly.  No acute cardiopulmonary disease. Electronically Signed   By: Rolm Baptise M.D.   On: 12/21/2020 09:41        Scheduled Meds: . sodium chloride   Intravenous Once  . Chlorhexidine Gluconate Cloth  6 each Topical Q0600  . insulin aspart  0-6 Units Subcutaneous Q4H  . latanoprost  1 drop Both Eyes QHS  . pantoprazole (PROTONIX) IV  40 mg Intravenous Q12H  . thiamine injection  100 mg Intravenous Daily  . timolol  1 drop Both Eyes Daily  . umeclidinium-vilanterol  1 puff Inhalation Daily   Continuous Infusions: .  sodium chloride    . sodium chloride       LOS: 0 days    Time spent: 39 minutes spent on chart review, discussion with nursing staff, consultants, updating family and interview/physical exam; more than 50% of that time was spent in counseling and/or coordination of care.    Tamasha Laplante J [place] DO [facility] Available via Epic secure chat 7am-7pm After these hours, please refer to coverage provider listed on [email or web address] 12/22/2020, 11:36 AM

## 2020-12-22 NOTE — Anesthesia Postprocedure Evaluation (Signed)
Anesthesia Post Note  Patient: Jacob [name]  Procedure(s) Performed: ESOPHAGOGASTRODUODENOSCOPY (EGD) WITH PROPOFOL (N/A ) HOT HEMOSTASIS (ARGON PLASMA COAGULATION/BICAP) (N/A ) HEMOSTASIS CONTROL/hemospray     Patient location during evaluation: PACU Anesthesia Type: MAC Level of consciousness: awake and alert and oriented Pain management: pain level controlled Vital Signs Assessment: post-procedure vital signs reviewed and stable Respiratory status: spontaneous breathing, nonlabored ventilation and respiratory function stable Cardiovascular status: blood pressure returned to baseline Postop Assessment: no apparent nausea or vomiting Anesthetic complications: no   No complications documented.  Last Vitals:  Vitals:   12/22/20 1328 12/22/20 1512  BP: (!) 108/29 (!) 117/41  Pulse: 94 84  Resp: 20 19  Temp: (!) 36.3 C 36.8 C  SpO2: 99% 100%    Last Pain:  Vitals:   12/22/20 1512  TempSrc: Axillary  PainSc: 0-No pain                 Brennan Bailey

## 2020-12-22 NOTE — Consult Note (Addendum)
[facility] Renal Consultation Note    Indication for Consultation:  Management of ESRD/hemodialysis; anemia, hypertension/volume and secondary hyperparathyroidism  ZOX:WRUEAVW, Jacob American, NP  HPI: Jacob [name] is a 70 year old male with end-stage renal disease who receives hemodialysis on Monday/Wednesday/Friday at [facility].  Reviewed outpatient HD center records. Patient last HD treatment 12/21/20; however, only had HD treatment for 1 hour. Patient presented to the ER on 12/21/2020 with complaints of shortness of breath and bloody stools.  Previous notes were reviewed.  Noted past hospitalizations for GI bleed/symptomatic anemia.  Patient seen and examined at bedside patient in the ER. He was receiving first unit of PRBC for Hgb of 6.1 (2 units were ordered).  RN reports patient had 7 dark-tarry stools.  In addition, patient states "I feel weak and feel I will pass-out". He denies CP, nausea, vomiting, fever, chills, and abdominal pain. An EGD was performed this afternoon. Plan for patient to be admitted by Medical Service and for patient to receive HD after EGD completed.  Pt seen by me after EGD-  Found a nodule in the duodenal bulb actively bleeding-  Treated with clips and hemostatic spray but still having residual bloody stools-  Many customer service c/o-  Also belly pain and nausea  Pertinent Test:  Abdominal/Chest X-Ray: Abdomen: No bowel obstruction or free air. Chest: Cardiomegaly with no acute cardiopulmonary disease.  Past Medical History:  Diagnosis Date  . Acute respiratory failure ([place]) 03/01/2018  . Arthritis    "all over; mostly knees and back" (02/28/2018)  . Chronic lower back pain    stenosis  . Community acquired pneumonia 09/06/2013  . COPD (chronic obstructive pulmonary disease) ([place])   . Coronary atherosclerosis of native coronary artery    a. 02/2003 s/p CABG x 2 (VG->RI, VG->RPDA; b. 11/2019 PCI: LM nl, LAD 90d, D3 50, RI 100, LCX 100p, OM3  100 - fills via L->L collats from D2/dLAD, RCA 100p, VG->RPDA ok, VG->RI 95 (3.5x48 Synergy XD DES).  . Drug abuse ([place])    hx; tested for cocaine as recently as 2/08. says he is not using drugs now - avoided defib. for this reason   . ESRD (end stage renal disease) ([place])    Hemo M-W-F- Richarda Blade  . Fall at home 10/2020  . GERD (gastroesophageal reflux disease)    takes OTC meds as needed  . GI bleeding    a. 11/2019 EGD: angiodysplastic lesions w/ bleeding s/p argon plasma/clipping/epi inj.  . Glaucoma    uses eye drops daily  . Hepatitis B 1968   "tx'd w/isolation; caught it from toilet stools in gym"  . HFrEF (heart failure with reduced ejection fraction) ([place])    a. 01/2019 Echo: EF 40-45%, diffuse HK, mild basal septal hypertrophy.  . History of blood transfusion 03/01/2019  . History of colon polyps    benign  . History of gout    takes Allopurinol daily as well as Colchicine-if needed (02/28/2018)  . History of kidney stones   . HTN (hypertension)    takes Coreg,Imdur.and Apresoline daily  . Human immunodeficiency virus (HIV) disease ([place]) dx'd 1995   takes Genvoya daily  . Hyperlipidemia    takes Atorvastatin daily  . Ischemic cardiomyopathy    a. 01/2019 Echo: EF 40-45%, diffuse HK, mild basal septal hypertrophy. Diast dysfxn. Nl RV size/fxn. Sev dil LA. Triv MR/TR/PR.  [name] Muscle spasm    takes Zanaflex as needed  . Myocardial infarction ([place]) ~ 2004/2005  . Nocturia   .  Peripheral neuropathy    takes gabapentin daily  . Pneumonia    "at least twice" (02/28/2018)  . Shortness of breath dyspnea    rarely but if notices it then with exertion  . Syphilis, unspecified   . Type II diabetes mellitus ([place]) 2004   Lantus daily.Average fasting blood sugar 125-199  . Wears glasses   . Wears partial dentures    Past Surgical History:  Procedure Laterality Date  . AV FISTULA PLACEMENT Left 08/02/2018   Procedure: ARTERIOVENOUS (AV) FISTULA CREATION  left arm radiocephlic;   Surgeon: Marty Heck, MD;  Location: [place];  Service: Vascular;  Laterality: Left;  . AV FISTULA PLACEMENT Left 08/01/2019   Procedure: LEFT BRACHIOCEPHALIC ARTERIOVENOUS (AV) FISTULA CREATION;  Surgeon: [name], MD;  Location: [place];  Service: Vascular;  Laterality: Left;  . BASCILIC VEIN TRANSPOSITION Left 10/03/2019   Procedure: BASILIC VEIN TRANSPOSITION LEFT SECOND STAGE;  Surgeon: [name], MD;  Location: [place];  Service: Vascular;  Laterality: Left;  . CARDIAC CATHETERIZATION  10/2002; 12/19/2004   [name] 03/08/2011  . COLONOSCOPY  2013   Dr.John Henrene Pastor   . CORONARY ARTERY BYPASS GRAFT  02/24/2003   CABG X2/notes 03/08/2011  . CORONARY STENT INTERVENTION N/A 12/19/2019   Procedure: CORONARY STENT INTERVENTION;  Surgeon: Jettie Booze, MD;  Location: [place] CV LAB;  Service: Cardiovascular;  Laterality: N/A;  . ESOPHAGOGASTRODUODENOSCOPY (EGD) WITH PROPOFOL N/A 02/08/2019   Procedure: ESOPHAGOGASTRODUODENOSCOPY (EGD) WITH PROPOFOL;  Surgeon: Milus Banister, MD;  Location: [place];  Service: Gastroenterology;  Laterality: N/A;  . ESOPHAGOGASTRODUODENOSCOPY (EGD) WITH PROPOFOL N/A 12/22/2019   Procedure: ESOPHAGOGASTRODUODENOSCOPY (EGD) WITH PROPOFOL;  Surgeon: Lavena Bullion, DO;  Location: [place];  Service: Gastroenterology;  Laterality: N/A;  . ESOPHAGOGASTRODUODENOSCOPY (EGD) WITH PROPOFOL N/A 10/19/2020   Procedure: ESOPHAGOGASTRODUODENOSCOPY (EGD) WITH PROPOFOL;  Surgeon: Jackquline Denmark, MD;  Location[facility] ENDOSCOPY;  Service: Endoscopy;  Laterality: N/A;  . HEMOSTASIS CLIP PLACEMENT  12/22/2019   Procedure: HEMOSTASIS CLIP PLACEMENT;  Surgeon: Lavena Bullion, DO;  Location: [place];  Service: Gastroenterology;;  . HOT HEMOSTASIS N/A 02/08/2019   Procedure: HOT HEMOSTASIS (ARGON PLASMA COAGULATION/BICAP);  Surgeon: Milus Banister, MD;  Location: [facility] ENDOSCOPY;  Service: Gastroenterology;  Laterality: N/A;  . HOT HEMOSTASIS N/A 12/22/2019    Procedure: HOT HEMOSTASIS (ARGON PLASMA COAGULATION/BICAP);  Surgeon: Lavena Bullion, DO;  Location: [facility] ENDOSCOPY;  Service: Gastroenterology;  Laterality: N/A;  . HOT HEMOSTASIS N/A 10/19/2020   Procedure: HOT HEMOSTASIS (ARGON PLASMA COAGULATION/BICAP);  Surgeon: Jackquline Denmark, MD;  Location: [facility] ENDOSCOPY;  Service: Endoscopy;  Laterality: N/A;  . INTERTROCHANTERIC HIP FRACTURE SURGERY Left 11/2006   [name] 03/08/2011  . INTRAVASCULAR ULTRASOUND/IVUS N/A 12/19/2019   Procedure: Intravascular Ultrasound/IVUS;  Surgeon: Jettie Booze, MD;  Location: [place] CV LAB;  Service: Cardiovascular;  Laterality: N/A;  . IR FLUORO GUIDE CV LINE RIGHT  07/24/2019  . IR FLUORO GUIDE CV LINE RIGHT  07/30/2019  . IR US GUIDE VASC ACCESS RIGHT  07/24/2019  . IR US GUIDE VASC ACCESS RIGHT  07/30/2019  . LAPAROSCOPIC CHOLECYSTECTOMY  05/2006  . LIGATION OF COMPETING BRANCHES OF ARTERIOVENOUS FISTULA Left 11/05/2018   Procedure: LIGATION OF COMPETING BRANCHES OF ARTERIOVENOUS FISTULA  LEFT  ARM;  Surgeon: Marty Heck, MD;  Location: [place];  Service: Vascular;  Laterality: Left;  . LUMBAR LAMINECTOMY/DECOMPRESSION MICRODISCECTOMY N/A 02/29/2016   Procedure: Left L4-5 Lateral Recess Decompression, Removal Extradural Intraspinal Facet Cyst;  Surgeon: Thana Farr  Lorin Mercy, MD;  Location: [place];  Service: Orthopedics;  Laterality: N/A;  . MULTIPLE TOOTH EXTRACTIONS    . ORIF MANDIBULAR FRACTURE Left 08/13/2004   ORIF of left body fracture mandible with KLS Martin 2.3-mm six hole/notes 03/08/2011  . RIGHT/LEFT HEART CATH AND CORONARY/GRAFT ANGIOGRAPHY N/A 12/19/2019   Procedure: RIGHT/LEFT HEART CATH AND CORONARY/GRAFT ANGIOGRAPHY;  Surgeon: Jettie Booze, MD;  Location: [place] CV LAB;  Service: Cardiovascular;  Laterality: N/A;  . SCLEROTHERAPY  12/22/2019   Procedure: SCLEROTHERAPY;  Surgeon: Lavena Bullion, DO;  Location: [facility] ENDOSCOPY;  Service: Gastroenterology;;   Family History  Problem  Relation Age of Onset  . Heart failure Father   . Hypertension Father   . Diabetes Brother   . Heart attack Brother   . Alzheimer's disease Mother   . Stroke Sister   . Diabetes Sister   . Alzheimer's disease Sister   . Hypertension Brother   . Diabetes Brother   . Drug abuse Brother   . Colon cancer Neg Hx    Social History:  reports that he has been smoking cigarettes. He has a 21.50 pack-year smoking history. He has never used smokeless tobacco. He reports previous alcohol use of about 12.0 standard drinks of alcohol per week. He reports previous drug use. Drug: Cocaine. Allergies  Allergen Reactions  . Augmentin [Amoxicillin-Pot Clavulanate] Diarrhea    Severe diarrhea  . Mucinex Fast-Max Other (See Comments)    Intensive sweating   . Amphetamines Other (See Comments)    Unknown reaction   Prior to Admission medications   Medication Sig Start Date End Date Taking? Authorizing Provider  acetaminophen (TYLENOL) 650 MG CR tablet Take 650 mg by mouth 3 (three) times daily.   Yes [provider]  allopurinol (ZYLOPRIM) 100 MG tablet TAKE 1 TABLET(100 MG) BY MOUTH DAILY Patient taking differently: Take 100 mg by mouth daily. 07/23/20  Yes Eubanks, Jacob American, NP  ANORO ELLIPTA 62.5-25 MCG/INH AEPB INHALE 1 PUFF BY MOUTH EVERY DAY Patient taking differently: Inhale 1 puff into the lungs daily. 12/21/20  Yes [name], NP  B-D UF III MINI PEN NEEDLES 31G X 5 MM MISC USE FOUR TIMES DAILY 03/02/20  Yes [name], NP  bictegravir-emtricitabine-tenofovir AF (BIKTARVY) 50-200-25 MG TABS tablet Take 1 tablet by mouth daily. 06/30/20  Yes [name], MD  carvedilol (COREG) 3.125 MG tablet Take 1 tablet (3.125 mg total) by mouth 2 (two) times daily. 02/13/20  Yes [name], MD  Cinacalcet HCl (SENSIPAR PO) Take 1 tablet by mouth 3 (three) times a week. At dialysis 08/14/20 08/13/21 Yes [provider]  colchicine 0.6 MG tablet Take 0.6-1.2 mg by mouth  daily as needed (gout flare-up).   Yes [provider]  diclofenac Sodium (VOLTAREN) 1 % GEL APPLY 2 GRAMS TO THE AFFECTED AREA THREE TIMES DAILY AS NEEDED FOR PAIN Patient taking differently: Apply 2 g topically 3 (three) times daily as needed (pain). 11/16/20  Yes [name], NP  folic acid (FOLVITE) 1 MG tablet Take 1 tablet (1 mg total) by mouth daily. 11/30/20  Yes [name], NP  gabapentin (NEURONTIN) 300 MG capsule Take 1 capsule (300 mg total) by mouth daily. Dose change due to renal function 12/09/20  Yes Eubanks, Jacob American, NP  insulin lispro (HUMALOG KWIKPEN) 100 UNIT/ML KwikPen Inject 0.54ml (20 Units total) into the skin 3 (three) times daily as needed for high blood sugar (above 150) Patient taking differently: Inject 0.92ml (  20 Units total) into the skin 3 (three) times daily as needed for high blood sugar (above 150) 03/12/20  Yes [name], NP  iron sucrose in sodium chloride 0.9 % 100 mL Iron Sucrose (Venofer) 07/08/20 07/07/21 Yes [provider]  isosorbide mononitrate (IMDUR) 30 MG 24 hr tablet TAKE 1 TABLET BY MOUTH EVERY DAY Patient taking differently: Take 30 mg by mouth daily. 08/10/20  Yes [name], NP  Lancets (ONETOUCH DELICA PLUS [id number]) MISC Inject 1 Device as directed 3 (three) times daily. Dx: E11.40 09/18/19  Yes [name], NP  latanoprost (XALATAN) 0.005 % ophthalmic solution Place 1 drop into both eyes at bedtime.   Yes [provider]  Methoxy PEG-Epoetin Beta (MIRCERA IJ) Mircera 07/06/20  Yes [provider]  multivitamin (RENA-VIT) TABS tablet Take 1 tablet by mouth daily.   Yes [provider]  nitroGLYCERIN (NITROSTAT) 0.3 MG SL tablet ONE TABLET UNDER TONGUE AS NEEDED FOR CHEST PAIN Patient taking differently: Place 0.3 mg under the tongue every 5 (five) minutes as needed for chest pain. 05/01/19  Yes [name], MD  Nutritional Supplements (VITAMIN D BOOSTER PO) Take 1 each  by mouth 2 (two) times daily. 08/12/20 08/11/21 Yes [provider]  ONETOUCH ULTRA test strip CHECK BLOOD SUGAR 3 TIMES  DAILY Patient taking differently: 1 each by Other route 3 (three) times daily. 05/27/20  Yes [name], NP  pantoprazole (PROTONIX) 40 MG tablet TAKE 1 TABLET(40 MG) BY MOUTH TWICE DAILY Patient taking differently: Take 40 mg by mouth 2 (two) times daily. 10/19/20  Yes Fayrene Helper, MD  polyethylene glycol (MIRALAX / GLYCOLAX) 17 g packet Take 17 g by mouth daily as needed for mild constipation. 11/07/20  Yes Aline August, MD  sevelamer carbonate (RENVELA) 800 MG tablet Take 800 mg by mouth 3 (three) times daily with meals.   Yes [provider]  ticagrelor (BRILINTA) 90 MG TABS tablet Take 90 mg by mouth 2 (two) times daily.   Yes [provider]  timolol (TIMOPTIC) 0.5 % ophthalmic solution Place 1 drop into both eyes daily. 04/04/19  Yes [provider]  traMADol (ULTRAM) 50 MG tablet Take 1 tablet (50 mg total) by mouth 2 (two) times daily as needed. 11/20/20  Yes Marybelle Killings, MD   Current Facility-Administered Medications  Medication Dose Route Frequency Provider Last Rate Last Admin  . 0.9 %  sodium chloride infusion (Manually program via Guardrails IV Fluids)   Intravenous Once [place] Eric J, DO   Held at 12/22/20 1124  . 0.9 %  sodium chloride infusion (Manually program via Guardrails IV Fluids)   Intravenous Once [place], CRNA      . 0.9 %  sodium chloride infusion  100 mL Intravenous PRN Jacob [name] E, NP      . 0.9 %  sodium chloride infusion  100 mL Intravenous PRN Jacob [name] E, NP      . alteplase (CATHFLO ACTIVASE) injection 2 mg  2 mg Intracatheter Once PRN Adelfa Koh, NP      . Chlorhexidine Gluconate Cloth 2 % PADS 6 each  6 each Topical Q0600 Adelfa Koh, NP      . heparin injection 1,000 Units  1,000 Units Dialysis PRN Adelfa Koh, NP      . insulin  aspart (novoLOG) injection 0-6 Units  0-6 Units Subcutaneous Q4H Rise Patience, MD      . latanoprost Ivin Poot)  0.005 % ophthalmic solution 1 drop  1 drop Both Eyes QHS Rise Patience, MD      . lidocaine (PF) (XYLOCAINE) 1 % injection 5 mL  5 mL Intradermal PRN Adelfa Koh, NP      . lidocaine-prilocaine (EMLA) cream 1 application  1 application Topical PRN Adelfa Koh, NP      . pantoprazole (PROTONIX) injection 40 mg  40 mg Intravenous Q12H Rise Patience, MD   40 mg at 12/22/20 1012  . pentafluoroprop-tetrafluoroeth (GEBAUERS) aerosol 1 application  1 application Topical PRN Jacob [name] E, NP      . thiamine (B-1) injection 100 mg  100 mg Intravenous Daily Rise Patience, MD   100 mg at 12/22/20 1015  . timolol (TIMOPTIC) 0.5 % ophthalmic solution 1 drop  1 drop Both Eyes Daily Rise Patience, MD   1 drop at 12/22/20 1016  . umeclidinium-vilanterol (ANORO ELLIPTA) 62.5-25 MCG/INH 1 puff  1 puff Inhalation Daily Rise Patience, MD       Current Outpatient Medications  Medication Sig Dispense Refill  . acetaminophen (TYLENOL) 650 MG CR tablet Take 650 mg by mouth 3 (three) times daily.    [name] allopurinol (ZYLOPRIM) 100 MG tablet TAKE 1 TABLET(100 MG) BY MOUTH DAILY (Patient taking differently: Take 100 mg by mouth daily.) 90 tablet 1  . ANORO ELLIPTA 62.5-25 MCG/INH AEPB INHALE 1 PUFF BY MOUTH EVERY DAY (Patient taking differently: Inhale 1 puff into the lungs daily.) 60 each 5  . B-D UF III MINI PEN NEEDLES 31G X 5 MM MISC USE FOUR TIMES DAILY 100 each 11  . bictegravir-emtricitabine-tenofovir AF (BIKTARVY) 50-200-25 MG TABS tablet Take 1 tablet by mouth daily. 30 tablet 11  . carvedilol (COREG) 3.125 MG tablet Take 1 tablet (3.125 mg total) by mouth 2 (two) times daily. 180 tablet 3  . Cinacalcet HCl (SENSIPAR PO) Take 1 tablet by mouth 3 (three) times a week. At dialysis    . colchicine 0.6 MG tablet Take 0.6-1.2 mg by mouth daily as  needed (gout flare-up).    . diclofenac Sodium (VOLTAREN) 1 % GEL APPLY 2 GRAMS TO THE AFFECTED AREA THREE TIMES DAILY AS NEEDED FOR PAIN (Patient taking differently: Apply 2 g topically 3 (three) times daily as needed (pain).) 557 g 0  . folic acid (FOLVITE) 1 MG tablet Take 1 tablet (1 mg total) by mouth daily. 90 tablet 0  . gabapentin (NEURONTIN) 300 MG capsule Take 1 capsule (300 mg total) by mouth daily. Dose change due to renal function 90 capsule 1  . insulin lispro (HUMALOG KWIKPEN) 100 UNIT/ML KwikPen Inject 0.78ml (20 Units total) into the skin 3 (three) times daily as needed for high blood sugar (above 150) (Patient taking differently: Inject 0.25ml (20 Units total) into the skin 3 (three) times daily as needed for high blood sugar (above 150)) 15 pen 1  . iron sucrose in sodium chloride 0.9 % 100 mL Iron Sucrose (Venofer)    . isosorbide mononitrate (IMDUR) 30 MG 24 hr tablet TAKE 1 TABLET BY MOUTH EVERY DAY (Patient taking differently: Take 30 mg by mouth daily.) 90 tablet 1  . Lancets (ONETOUCH DELICA PLUS [id number]) MISC Inject 1 Device as directed 3 (three) times daily. Dx: E11.40 300 each 3  . latanoprost (XALATAN) 0.005 % ophthalmic solution Place 1 drop into both eyes at bedtime.    . Methoxy PEG-Epoetin Beta (MIRCERA IJ) Mircera    . multivitamin (RENA-VIT) TABS tablet Take  1 tablet by mouth daily.    . nitroGLYCERIN (NITROSTAT) 0.3 MG SL tablet ONE TABLET UNDER TONGUE AS NEEDED FOR CHEST PAIN (Patient taking differently: Place 0.3 mg under the tongue every 5 (five) minutes as needed for chest pain.) 25 tablet 4  . Nutritional Supplements (VITAMIN D BOOSTER PO) Take 1 each by mouth 2 (two) times daily.    Glory Rosebush ULTRA test strip CHECK BLOOD SUGAR 3 TIMES  DAILY (Patient taking differently: 1 each by Other route 3 (three) times daily.) 300 strip 3  . pantoprazole (PROTONIX) 40 MG tablet TAKE 1 TABLET(40 MG) BY MOUTH TWICE DAILY (Patient taking differently: Take 40 mg by mouth 2  (two) times daily.) 180 tablet 0  . polyethylene glycol (MIRALAX / GLYCOLAX) 17 g packet Take 17 g by mouth daily as needed for mild constipation. 14 each 0  . sevelamer carbonate (RENVELA) 800 MG tablet Take 800 mg by mouth 3 (three) times daily with meals.    . ticagrelor (BRILINTA) 90 MG TABS tablet Take 90 mg by mouth 2 (two) times daily.    . timolol (TIMOPTIC) 0.5 % ophthalmic solution Place 1 drop into both eyes daily.    . traMADol (ULTRAM) 50 MG tablet Take 1 tablet (50 mg total) by mouth 2 (two) times daily as needed. 20 tablet 0   Labs: Basic Metabolic Panel: Recent Labs  Lab 12/21/20 0834 12/21/20 2052 12/22/20 0223  NA 141 142 140  K 3.8 4.1 4.2  CL 101 103 102  CO2 26 24 23   GLUCOSE 130* 85 109*  BUN 81* 121* 135*  CREATININE 5.47* 7.43* 7.37*  CALCIUM 8.7* 9.1 9.0   Liver Function Tests: Recent Labs  Lab 12/21/20 0834  AST 16  ALT 9  ALKPHOS 79  BILITOT 0.8  PROT 6.8  ALBUMIN 2.8*   Recent Labs  Lab 12/21/20 0834  LIPASE 73*   CBC: Recent Labs  Lab 12/21/20 0834 12/21/20 2052 12/22/20 0223 12/22/20 0932  WBC 7.5 7.1 7.6  --   NEUTROABS 5.8  --   --   --   HGB 6.2* 6.0* 6.5* 6.1*  HCT 20.6* 19.3* 20.9* 18.3*  MCV 103.0* 99.0 98.1  --   PLT 168 147* 129*  --    CBG: Recent Labs  Lab 12/22/20 0818 12/22/20 1219  GLUCAP 146* 148*   Studies/Results: DG Abdomen Acute W/Chest  Result Date: 12/21/2020 CLINICAL DATA:  Abdominal pain, vomiting EXAM: DG ABDOMEN ACUTE WITH 1 VIEW CHEST COMPARISON:  07/23/2019 FINDINGS: Prior CABG. Cardiomegaly. Aortic atherosclerosis. Areas of scarring in the upper lobes bilaterally. No acute confluent opacities or effusions. Prior cholecystectomy. Nonobstructive bowel gas pattern. No free air or organomegaly. Vascular calcifications noted. No visible aneurysm. IMPRESSION: No bowel obstruction or free air. Cardiomegaly.  No acute cardiopulmonary disease. Electronically Signed   By: Rolm Baptise M.D.   On: 12/21/2020  09:41    ROS:  General: No weight loss, fever, chills  HEENT: No recent headaches or visual changes Neurologic: No dizziness Cardiac: No recent episodes of chest pain/pressure,  shortness of breath at rest or DOE.  Pulmonary: Patient reports feeling SOB. Hematologic: History GIBs/Symptomatic anemia Gastrointestinal: Patient reports blooding stools prior to admission to ER; currently passing frequent tarry stools in ER. Skin: No rashes or lesions  Physical Exam: Vitals:   12/22/20 1522 12/22/20 1531 12/22/20 1622 12/22/20 1630  BP: (!) 129/41 (!) 150/43 (!) 123/58 (!) 122/59  Pulse: 84 84    Resp: 20 18  (!)  22  Temp:      TempSrc:      SpO2:  100%  99%  Weight:      Height:         General: Ill appearing; On O2 va [place]; In no acute respiratory distress Head: NCAT sclera not icteric  Lungs: CTA bilaterally. No wheeze, rales or rhonchi. Breathing mildly labored. Heart: S1 and S2; No murmur, rubs or gallops.  Abdomen: soft, nontender, +BS, no guarding, no rebound tenderness Lower extremities: no edema bilateral hips or lower extremities Neuro: AAOx3. Moves all extremities spontaneously. Psych:  Responds to questions appropriately with a normal affect. Dialysis Access: L AVF (+) Bruit/Thrill  Dialysis Orders:  MWF - [facility]  4hrs, BFR 400, DFR 500,  EDW 76kg, 2K/ 2Ca Access: L AVF Mircera 170mcg q2wks - last 12/09/20 Calcitriol 3.57mcg PO qHD-last 12/21/20 Renvela 800mg  3 tablets with meals  Last Labs: Hgb 6.1 (2 units PRBCs ordered in ER), TSAT 13 (12/03/20 OP HD), K 4.2, Ca 9.0, P 3.9 (12/03/20 OP HD), PTH 712 (12/03/20 OP HD)  Assessment/Plan: 1.  GIB: Hgb 6.1; Orders to receive 2 units PRBCs. EGD performed this afternoon. Awaiting results.  Nodule in duodenal bulb actively bleeding -  Treated with clips and hemostatic spray with significant slowing 2.  ESRD - On HD Mon/Wed/Fri. Only received 1 hour tx on 12/21/20 in outpatient HD center. Plan is for patient to  receive HD today after EGD completed. Is very uremic due to missed HD and GIB 3.  Hypertension/volume  - Noted soft BP trend earlier during consult visit. Patient received PRBCs today. No evidence of fluid volume overload on exam. Currently blood pressures appear to be improving. Continue to monitor trend. 4.  Anemia of CKD - Hgb 6.1; patient received 2 units PRBCs; will order Aranesp  will increase dose and give sooner 5.  Secondary Hyperparathyroidism -  Ca 9.0. Patient is currently NPO Calcitriol and binders on hold for now. Will monitor trend. May initiate Hectorol. 6.  Nutrition - Patient is currently NPO. Advance to renal diet as tolerated when patient is clinically stable.  Jacob Poet, NP [facility] 12/22/2020, 4:59 PM   Patient seen and examined, agree with above note with above modifications. Pt known to [place]-  ESRD and history of GIB.  He presented yesterday initially -  Was hoping that he could be managed as OP but returned early this AM.  S/p EGD-  Results noted above-  Transfusing.  Is very uremic in the setting of missed HD and GIB so will do HD tonight off schedule.  Likely will need next treatment on Thursday off schedule. Maximize anemia treatment but hold bone meds for now while NPO Corliss Parish, MD 12/22/2020

## 2020-12-22 NOTE — Plan of Care (Signed)
  Problem: Education: Goal: Knowledge of General Education information will improve Description: Including pain rating scale, medication(s)/side effects and non-pharmacologic comfort measures Outcome: Progressing   Problem: Clinical Measurements: Goal: Ability to maintain clinical measurements within normal limits will improve Outcome: Progressing   Problem: Clinical Measurements: Goal: Respiratory complications will improve Outcome: Progressing   Problem: Clinical Measurements: Goal: Cardiovascular complication will be avoided Outcome: Progressing   Problem: Pain Managment: Goal: General experience of comfort will improve Outcome: Progressing   Problem: Safety: Goal: Ability to remain free from injury will improve Outcome: Progressing   

## 2020-12-22 NOTE — Op Note (Signed)
[facility] Patient Name: Jacob [name] Procedure Date : 12/22/2020 MRN: [id number] Attending MD: Gatha Mayer , MD Date of Birth: 1951-10-09 CSN: [id number] Age: 70 Admit Type: Inpatient Procedure:                Upper GI endoscopy Indications:              Melena Providers:                Gatha Mayer, MD, Burtis Junes, RN, Cherylynn Ridges,                            Technician, Edmonia James, CRNA Referring MD:              Medicines:                Propofol per Anesthesia, Monitored Anesthesia Care Complications:            No immediate complications. Estimated Blood Loss:     Estimated blood loss was minimal. Procedure:                Pre-Anesthesia Assessment:                           - Prior to the procedure, a History and Physical                            was performed, and patient medications and                            allergies were reviewed. The patient's tolerance of                            previous anesthesia was also reviewed. The risks                            and benefits of the procedure and the sedation                            options and risks were discussed with the patient.                            All questions were answered, and informed consent                            was obtained. Prior Anticoagulants: The patient                            last took antiplatelet medication 2 days prior to                            the procedure (ticagrelor). ASA Grade Assessment:                            IV - A patient with severe systemic disease that is  a constant threat to life. After reviewing the                            risks and benefits, the patient was deemed in                            satisfactory condition to undergo the procedure.                           After obtaining informed consent, the endoscope was                            passed under direct vision. Throughout the                             procedure, the patient's blood pressure, pulse, and                            oxygen saturations were monitored continuously. The                            GIF-H190 ([id number]) Olympus gastroscope was                            introduced through the mouth, and advanced to the                            second part of duodenum. The upper GI endoscopy was                            somewhat difficult due to poor endoscopic                            visualization. Successful completion of the                            procedure was aided by performing the maneuvers                            documented (below) in this report. The patient                            tolerated the procedure well. Scope In: Scope Out: Findings:      Red blood was found in the duodenal bulb.      A single nodule was found in the duodenal bulb. Coagulation for       hemostasis using argon plasma was partially successful. For hemostasis,       four hemostatic clips were successfully placed (MR conditional).       Bleeding substantially slowed. Estimated blood loss was minimal. To stop       active bleeding, hemostatic spray was deployed. Three sprays were       applied. There was no bleeding at the end of the procedure. Estimated       blood loss: none.  The exam was otherwise without abnormality.      The cardia and gastric fundus were normal on retroflexion. Impression:               - Blood in the duodenal bulb.                           - Nodule found in the duodenum. steady                            non-pulsatile bleeding seen at base, very close to                            distal aspect pylorus so positioning difficult - ?                            AVM Treated with argon plasma coagulation (APC) -                            partially successful Clips (MR conditional) were                            placed. hemostatic spray applied. No bleeding at end                           - The  examination was otherwise normal.                           - No specimens collected. Recommendation:           - Return patient to hospital ward for ongoing care.                           - NPO.                           - If it appears he is still bleeding would                            transfuse platelets to counteract ticagrelor and                            also uremic PLT's                           Can relook also - if needed                           PPI infusion to keep pH > 4 and promote clotting                           check INR Procedure Code(s):        --- Professional ---                           43255, Esophagogastroduodenoscopy, flexible,  transoral; with control of bleeding, any method Diagnosis Code(s):        --- Professional ---                           K92.2, Gastrointestinal hemorrhage, unspecified                           K31.89, Other diseases of stomach and duodenum                           K92.1, Melena (includes Hematochezia) CPT copyright 2019 [employer]. All rights reserved. The codes documented in this report are preliminary and upon coder review may  be revised to meet current compliance requirements. Gatha Mayer, MD 12/22/2020 3:07:27 PM This report has been signed electronically. Number of Addenda: 0

## 2020-12-22 NOTE — H&P (Signed)
History and Physical    Jacob [name] UMP:[id number] DOB: 05-10-1951 DOA: 12/21/2020  PCP: [name], NP  Patient coming from: Home.  Chief Complaint: Rectal bleeding.  HPI: Jacob [name] is a 70 y.o. male with history of recurrent GI bleed he has had endoscopy in December 2021 when EGD showed gastric AVMs which were cauterized presently on Brilinta for history of CAD presents to the ER for the second time in the last 24 hours for frank rectal bleeding.  Had come to the ER earlier yesterday morning with rectal bleeding at that time hemoglobin was found to be around 6 was transfused PRBC and patient left home.  After which patient had again multiple episodes of bleeding presents to the ER again.  Denies any abdominal pain nausea vomiting chest pain or shortness of breath.  ED Course: In the ER patient blood pressure was in the low normal with a hemoglobin again around 6 despite transfusion early in the morning.  Had another bout of rectal bleeding in the ER.  PRBC transfusion has been initiated and started on Protonix IV admitted for acute GI bleed likely from gastric AVMs.  Review of Systems: As per HPI, rest all negative.   Past Medical History:  Diagnosis Date  . Acute respiratory failure ([place]) 03/01/2018  . Arthritis    "all over; mostly knees and back" (02/28/2018)  . Chronic lower back pain    stenosis  . Community acquired pneumonia 09/06/2013  . COPD (chronic obstructive pulmonary disease) ([place])   . Coronary atherosclerosis of native coronary artery    a. 02/2003 s/p CABG x 2 (VG->RI, VG->RPDA; b. 11/2019 PCI: LM nl, LAD 90d, D3 50, RI 100, LCX 100p, OM3 100 - fills via L->L collats from D2/dLAD, RCA 100p, VG->RPDA ok, VG->RI 95 (3.5x48 Synergy XD DES).  . Drug abuse ([place])    hx; tested for cocaine as recently as 2/08. says he is not using drugs now - avoided defib. for this reason   . ESRD (end stage renal disease) ([place])    Hemo M-W-F- [facility] Blade  . Fall at home 10/2020   . GERD (gastroesophageal reflux disease)    takes OTC meds as needed  . GI bleeding    a. 11/2019 EGD: angiodysplastic lesions w/ bleeding s/p argon plasma/clipping/epi inj.  . Glaucoma    uses eye drops daily  . Hepatitis B 1968   "tx'd w/isolation; caught it from toilet stools in gym"  . HFrEF (heart failure with reduced ejection fraction) ([place])    a. 01/2019 Echo: EF 40-45%, diffuse HK, mild basal septal hypertrophy.  . History of blood transfusion 03/01/2019  . History of colon polyps    benign  . History of gout    takes Allopurinol daily as well as Colchicine-if needed (02/28/2018)  . History of kidney stones   . HTN (hypertension)    takes Coreg,Imdur.and Apresoline daily  . Human immunodeficiency virus (HIV) disease ([place]) dx'd 1995   takes Genvoya daily  . Hyperlipidemia    takes Atorvastatin daily  . Ischemic cardiomyopathy    a. 01/2019 Echo: EF 40-45%, diffuse HK, mild basal septal hypertrophy. Diast dysfxn. Nl RV size/fxn. Sev dil LA. Triv MR/TR/PR.  [name] Muscle spasm    takes Zanaflex as needed  . Myocardial infarction ([place]) ~ 2004/2005  . Nocturia   . Peripheral neuropathy    takes gabapentin daily  . Pneumonia    "at least twice" (02/28/2018)  . Shortness of breath dyspnea  rarely but if notices it then with exertion  . Syphilis, unspecified   . Type II diabetes mellitus ([place]) 2004   Lantus daily.Average fasting blood sugar 125-199  . Wears glasses   . Wears partial dentures     Past Surgical History:  Procedure Laterality Date  . AV FISTULA PLACEMENT Left 08/02/2018   Procedure: ARTERIOVENOUS (AV) FISTULA CREATION  left arm radiocephlic;  Surgeon: Marty Heck, MD;  Location: [facility];  Service: Vascular;  Laterality: Left;  . AV FISTULA PLACEMENT Left 08/01/2019   Procedure: LEFT BRACHIOCEPHALIC ARTERIOVENOUS (AV) FISTULA CREATION;  Surgeon: [name], MD;  Location: [facility];  Service: Vascular;  Laterality: Left;  . BASCILIC VEIN TRANSPOSITION Left  10/03/2019   Procedure: BASILIC VEIN TRANSPOSITION LEFT SECOND STAGE;  Surgeon: [name], MD;  Location: [facility];  Service: Vascular;  Laterality: Left;  . CARDIAC CATHETERIZATION  10/2002; 12/19/2004   [name] 03/08/2011  . COLONOSCOPY  2013   Dr.John Henrene Pastor   . CORONARY ARTERY BYPASS GRAFT  02/24/2003   CABG X2/notes 03/08/2011  . CORONARY STENT INTERVENTION N/A 12/19/2019   Procedure: CORONARY STENT INTERVENTION;  Surgeon: Jettie Booze, MD;  Location: [facility];  Service: Cardiovascular;  Laterality: N/A;  . ESOPHAGOGASTRODUODENOSCOPY (EGD) WITH PROPOFOL N/A 02/08/2019   Procedure: ESOPHAGOGASTRODUODENOSCOPY (EGD) WITH PROPOFOL;  Surgeon: Milus Banister, MD;  Location: [facility];  Service: Gastroenterology;  Laterality: N/A;  . ESOPHAGOGASTRODUODENOSCOPY (EGD) WITH PROPOFOL N/A 12/22/2019   Procedure: ESOPHAGOGASTRODUODENOSCOPY (EGD) WITH PROPOFOL;  Surgeon: Lavena Bullion, DO;  Location: [facility];  Service: Gastroenterology;  Laterality: N/A;  . ESOPHAGOGASTRODUODENOSCOPY (EGD) WITH PROPOFOL N/A 10/19/2020   Procedure: ESOPHAGOGASTRODUODENOSCOPY (EGD) WITH PROPOFOL;  Surgeon: Jackquline Denmark, MD;  Location: [facility] ENDOSCOPY;  Service: Endoscopy;  Laterality: N/A;  . HEMOSTASIS CLIP PLACEMENT  12/22/2019   Procedure: HEMOSTASIS CLIP PLACEMENT;  Surgeon: Lavena Bullion, DO;  Location: [facility];  Service: Gastroenterology;;  . HOT HEMOSTASIS N/A 02/08/2019   Procedure: HOT HEMOSTASIS (ARGON PLASMA COAGULATION/BICAP);  Surgeon: Milus Banister, MD;  Location: [facility] ENDOSCOPY;  Service: Gastroenterology;  Laterality: N/A;  . HOT HEMOSTASIS N/A 12/22/2019   Procedure: HOT HEMOSTASIS (ARGON PLASMA COAGULATION/BICAP);  Surgeon: Lavena Bullion, DO;  Location: [facility] ENDOSCOPY;  Service: Gastroenterology;  Laterality: N/A;  . HOT HEMOSTASIS N/A 10/19/2020   Procedure: HOT HEMOSTASIS (ARGON PLASMA COAGULATION/BICAP);  Surgeon: Jackquline Denmark, MD;  Location: [facility] ENDOSCOPY;   Service: Endoscopy;  Laterality: N/A;  . INTERTROCHANTERIC HIP FRACTURE SURGERY Left 11/2006   [name] 03/08/2011  . INTRAVASCULAR ULTRASOUND/IVUS N/A 12/19/2019   Procedure: Intravascular Ultrasound/IVUS;  Surgeon: Jettie Booze, MD;  Location: [facility];  Service: Cardiovascular;  Laterality: N/A;  . IR FLUORO GUIDE CV LINE RIGHT  07/24/2019  . IR FLUORO GUIDE CV LINE RIGHT  07/30/2019  . IR US GUIDE VASC ACCESS RIGHT  07/24/2019  . IR US GUIDE VASC ACCESS RIGHT  07/30/2019  . LAPAROSCOPIC CHOLECYSTECTOMY  05/2006  . LIGATION OF COMPETING BRANCHES OF ARTERIOVENOUS FISTULA Left 11/05/2018   Procedure: LIGATION OF COMPETING BRANCHES OF ARTERIOVENOUS FISTULA  LEFT  ARM;  Surgeon: Marty Heck, MD;  Location: [facility];  Service: Vascular;  Laterality: Left;  . LUMBAR LAMINECTOMY/DECOMPRESSION MICRODISCECTOMY N/A 02/29/2016   Procedure: Left L4-5 Lateral Recess Decompression, Removal Extradural Intraspinal Facet Cyst;  Surgeon: Marybelle Killings, MD;  Location: [facility];  Service: Orthopedics;  Laterality: N/A;  . MULTIPLE TOOTH EXTRACTIONS    . ORIF MANDIBULAR FRACTURE Left 08/13/2004  ORIF of left body fracture mandible with KLS Martin 2.3-mm six hole/notes 03/08/2011  . RIGHT/LEFT HEART CATH AND CORONARY/GRAFT ANGIOGRAPHY N/A 12/19/2019   Procedure: RIGHT/LEFT HEART CATH AND CORONARY/GRAFT ANGIOGRAPHY;  Surgeon: Jettie Booze, MD;  Location: [facility] CV LAB;  Service: Cardiovascular;  Laterality: N/A;  . SCLEROTHERAPY  12/22/2019   Procedure: SCLEROTHERAPY;  Surgeon: Lavena Bullion, DO;  Location: [facility] ENDOSCOPY;  Service: Gastroenterology;;     reports that he has been smoking cigarettes. He has a 21.50 pack-year smoking history. He has never used smokeless tobacco. He reports previous alcohol use of about 12.0 standard drinks of alcohol per week. He reports previous drug use. Drug: Cocaine.  Allergies  Allergen Reactions  . Augmentin [Amoxicillin-Pot Clavulanate] Diarrhea     Severe diarrhea  . Mucinex Fast-Max Other (See Comments)    Intensive sweating   . Amphetamines Other (See Comments)    Unknown reaction    Family History  Problem Relation Age of Onset  . Heart failure Father   . Hypertension Father   . Diabetes Brother   . Heart attack Brother   . Alzheimer's disease Mother   . Stroke Sister   . Diabetes Sister   . Alzheimer's disease Sister   . Hypertension Brother   . Diabetes Brother   . Drug abuse Brother   . Colon cancer Neg Hx     Prior to Admission medications   Medication Sig Start Date End Date Taking? Authorizing Provider  acetaminophen (TYLENOL) 650 MG CR tablet Take 650 mg by mouth 3 (three) times daily.    [provider]  allopurinol (ZYLOPRIM) 100 MG tablet TAKE 1 TABLET(100 MG) BY MOUTH DAILY 07/23/20   [name], NP  ANORO ELLIPTA 62.5-25 MCG/INH AEPB INHALE 1 PUFF BY MOUTH EVERY DAY 12/21/20   [name], NP  B-D UF III MINI PEN NEEDLES 31G X 5 MM MISC USE FOUR TIMES DAILY 03/02/20   [name], NP  bictegravir-emtricitabine-tenofovir AF (BIKTARVY) 50-200-25 MG TABS tablet Take 1 tablet by mouth daily. 06/30/20   [name], MD  carvedilol (COREG) 3.125 MG tablet Take 1 tablet (3.125 mg total) by mouth 2 (two) times daily. 02/13/20   [name], MD  Cinacalcet HCl (SENSIPAR PO) Take by mouth.  08/14/20 08/13/21  [provider]  colchicine 0.6 MG tablet Take 0.6-1.2 mg by mouth daily as needed (gout flare-up).    [provider]  diclofenac Sodium (VOLTAREN) 1 % GEL APPLY 2 GRAMS TO THE AFFECTED AREA THREE TIMES DAILY AS NEEDED FOR PAIN 11/16/20   [name], NP  folic acid (FOLVITE) 1 MG tablet Take 1 tablet (1 mg total) by mouth daily. 11/30/20   [name], NP  gabapentin (NEURONTIN) 300 MG capsule Take 1 capsule (300 mg total) by mouth daily. Dose change due to renal function 12/09/20   [name], NP  insulin lispro (HUMALOG KWIKPEN) 100 UNIT/ML  KwikPen Inject 0.75ml (20 Units total) into the skin 3 (three) times daily as needed for high blood sugar (above 150) Patient taking differently: Inject 0.64ml (20 Units total) into the skin 3 (three) times daily as needed for high blood sugar (above 150) 03/12/20   [name], NP  iron sucrose in sodium chloride 0.9 % 100 mL Iron Sucrose (Venofer) 07/08/20 07/07/21  [provider]  isosorbide mononitrate (IMDUR) 30 MG 24 hr tablet TAKE 1 TABLET BY MOUTH EVERY DAY 08/10/20   [name], NP  Lancets (ONETOUCH DELICA PLUS [id number]) MISC Inject 1 Device as directed 3 (three) times daily. Dx: E11.40 09/18/19   [name], NP  latanoprost (XALATAN) 0.005 % ophthalmic solution Place 1 drop into both eyes at bedtime.    [provider]  Methoxy PEG-Epoetin Beta (MIRCERA IJ) Mircera 07/06/20   [provider]  multivitamin (RENA-VIT) TABS tablet Take 1 tablet by mouth daily.    [provider]  nitroGLYCERIN (NITROSTAT) 0.3 MG SL tablet ONE TABLET UNDER TONGUE AS NEEDED FOR CHEST PAIN 05/01/19   [name], MD  Nutritional Supplements (VITAMIN D BOOSTER PO) Take 1 each by mouth 2 (two) times daily. 08/12/20 08/11/21  [provider]  ONETOUCH ULTRA test strip CHECK BLOOD SUGAR 3 TIMES  DAILY 05/27/20   [name], NP  pantoprazole (PROTONIX) 40 MG tablet TAKE 1 TABLET(40 MG) BY MOUTH TWICE DAILY 10/19/20   Fayrene Helper, MD  polyethylene glycol (MIRALAX / GLYCOLAX) 17 g packet Take 17 g by mouth daily as needed for mild constipation. 11/07/20   Aline August, MD  sevelamer carbonate (RENVELA) 800 MG tablet Take 800 mg by mouth 3 (three) times daily with meals.    [provider]  ticagrelor (BRILINTA) 90 MG TABS tablet Take 90 mg by mouth 2 (two) times daily.    [provider]  timolol (TIMOPTIC) 0.5 % ophthalmic solution Place 1 drop into both eyes daily. 04/04/19   [provider]  traMADol (ULTRAM)  50 MG tablet Take 1 tablet (50 mg total) by mouth 2 (two) times daily as needed. 11/20/20   Marybelle Killings, MD    Physical Exam: Constitutional: Moderately built and nourished. Vitals:   12/21/20 2315 12/21/20 2330 12/21/20 2345 12/22/20 0015  BP: (!) 115/48 (!) 108/56 (!) 97/53 (!) 110/58  Pulse:      Resp: (!) 21 17 (!) 27   Temp:      TempSrc:      SpO2:       Eyes: Anicteric no pallor. ENMT: No discharge from the ears eyes nose or mouth. Neck: No mass felt.  No neck rigidity. Respiratory: No rhonchi or crepitations. Cardiovascular: S1-S2 heard. Abdomen: Soft nontender bowel sounds present. Musculoskeletal: No edema. Skin: No rash. Neurologic: Alert awake oriented to time place and person.  Moves all extremities. Psychiatric: Appears normal.  Normal affect.   Labs on Admission: I have personally reviewed following labs and imaging studies  CBC: Recent Labs  Lab 12/15/20 1105 12/21/20 0834 12/21/20 2052  WBC 6.0 7.5 7.1  NEUTROABS 3,750 5.8  --   HGB 8.2* 6.2* 6.0*  HCT 26.0* 20.6* 19.3*  MCV 98.9 103.0* 99.0  PLT 192 168 956*   Basic Metabolic Panel: Recent Labs  Lab 12/15/20 1105 12/21/20 0834 12/21/20 2052  NA 138 141 142  K 4.0 3.8 4.1  CL 95* 101 103  CO2 29 26 24   GLUCOSE 112* 130* 85  BUN 51* 81* 121*  CREATININE 5.77* 5.47* 7.43*  CALCIUM 8.7 8.7* 9.1   GFR: Estimated Creatinine Clearance: 10.4 mL/min (A) (by C-G formula based on SCr of 7.43 mg/dL (H)). Liver Function Tests: Recent Labs  Lab 12/15/20 1105 12/21/20 0834  AST 12 16  ALT 7* 9  ALKPHOS  --  79  BILITOT 0.3 0.8  PROT 7.1 6.8  ALBUMIN  --  2.8*   Recent Labs  Lab 12/21/20 0834  LIPASE 73*   No results for input(s): AMMONIA in the last 168 hours.  Coagulation Profile: No results for input(s): INR, PROTIME in the last 168 hours. Cardiac Enzymes: No results for input(s): CKTOTAL, CKMB, CKMBINDEX, TROPONINI in the last 168 hours. BNP (last 3 results) No results for  input(s): PROBNP in the last 8760 hours. HbA1C: No results for input(s): HGBA1C in the last 72 hours. CBG: No results for input(s): GLUCAP in the last 168 hours. Lipid Profile: No results for input(s): CHOL, HDL, LDLCALC, TRIG, CHOLHDL, LDLDIRECT in the last 72 hours. Thyroid Function Tests: No results for input(s): TSH, T4TOTAL, FREET4, T3FREE, THYROIDAB in the last 72 hours. Anemia Panel: No results for input(s): VITAMINB12, FOLATE, FERRITIN, TIBC, IRON, RETICCTPCT in the last 72 hours. Urine analysis:    Component Value Date/Time   COLORURINE YELLOW 10/16/2020 2136   APPEARANCEUR CLEAR 10/16/2020 2136   LABSPEC 1.016 10/16/2020 2136   PHURINE 5.0 10/16/2020 2136   GLUCOSEU NEGATIVE 10/16/2020 2136   GLUCOSEU NEG mg/dL 09/20/2010 2058   HGBUR SMALL (A) 10/16/2020 2136   HGBUR negative 05/31/2010 0948   BILIRUBINUR NEGATIVE 10/16/2020 2136   [place] NEGATIVE 10/16/2020 2136   PROTEINUR 30 (A) 10/16/2020 2136   UROBILINOGEN 0.2 06/24/2015 2336   NITRITE NEGATIVE 10/16/2020 2136   LEUKOCYTESUR NEGATIVE 10/16/2020 2136   Sepsis Labs: @LABRCNTIP (procalcitonin:4,lacticidven:4) ) Recent Results (from the past 240 hour(s))  Resp Panel by RT-PCR (Flu A&B, Covid) Nasopharyngeal Swab     Status: None   Collection Time: 12/21/20  9:28 PM   Specimen: Nasopharyngeal Swab; Nasopharyngeal(NP) swabs in vial transport medium  Result Value Ref Range Status   SARS Coronavirus 2 by RT PCR NEGATIVE NEGATIVE Final    Comment: (NOTE) SARS-CoV-2 target nucleic acids are NOT DETECTED.  The SARS-CoV-2 RNA is generally detectable in upper respiratory specimens during the acute phase of infection. The lowest concentration of SARS-CoV-2 viral copies this assay can detect is 138 copies/mL. A negative result does not preclude SARS-Cov-2 infection and should not be used as the sole basis for treatment or other patient management decisions. A negative result may occur with  improper specimen  collection/handling, submission of specimen other than nasopharyngeal swab, presence of viral mutation(s) within the areas targeted by this assay, and inadequate number of viral copies(<138 copies/mL). A negative result must be combined with clinical observations, patient history, and epidemiological information. The expected result is Negative.  Fact Sheet for Patients:  EntrepreneurPulse.com.au  Fact Sheet for Healthcare Providers:  IncredibleEmployment.be  This test is no t yet approved or cleared by the [place] FDA and  has been authorized for detection and/or diagnosis of SARS-CoV-2 by FDA under an Emergency Use Authorization (EUA). This EUA will remain  in effect (meaning this test can be used) for the duration of the COVID-19 declaration under Section 564(b)(1) of the Act, 21 U.S.C.section 360bbb-3(b)(1), unless the authorization is terminated  or revoked sooner.       Influenza A by PCR NEGATIVE NEGATIVE Final   Influenza B by PCR NEGATIVE NEGATIVE Final    Comment: (NOTE) The Xpert Xpress SARS-CoV-2/FLU/RSV plus assay is intended as an aid in the diagnosis of influenza from Nasopharyngeal swab specimens and should not be used as a sole basis for treatment. Nasal washings and aspirates are unacceptable for Xpert Xpress SARS-CoV-2/FLU/RSV testing.  Fact Sheet for Patients: EntrepreneurPulse.com.au  Fact Sheet for Healthcare Providers: IncredibleEmployment.be  This test is not yet approved or cleared by the [place] FDA and has been authorized for detection and/or diagnosis of SARS-CoV-2 by FDA under an Emergency Use Authorization (EUA). This  EUA will remain in effect (meaning this test can be used) for the duration of the COVID-19 declaration under Section 564(b)(1) of the Act, 21 U.S.C. section 360bbb-3(b)(1), unless the authorization is terminated or revoked.  Performed at [facility], [place] [address] [place], [place] [address]      Radiological Exams on Admission: DG Abdomen Acute W/Chest  Result Date: 12/21/2020 CLINICAL DATA:  Abdominal pain, vomiting EXAM: DG ABDOMEN ACUTE WITH 1 VIEW CHEST COMPARISON:  07/23/2019 FINDINGS: Prior CABG. Cardiomegaly. Aortic atherosclerosis. Areas of scarring in the upper lobes bilaterally. No acute confluent opacities or effusions. Prior cholecystectomy. Nonobstructive bowel gas pattern. No free air or organomegaly. Vascular calcifications noted. No visible aneurysm. IMPRESSION: No bowel obstruction or free air. Cardiomegaly.  No acute cardiopulmonary disease. Electronically Signed   By: Rolm Baptise M.D.   On: 12/21/2020 09:41    EKG: Independently reviewed.  Normal sinus rhythm with PVCs.  Assessment/Plan Principal Problem:   Acute GI bleeding Active Problems:   Human immunodeficiency virus (HIV) disease ([place])   Essential hypertension   Coronary artery disease involving native heart without angina pectoris   Type 2 diabetes mellitus with diabetic polyneuropathy, with long-term current use of insulin ([place])   Gastric AVM   ESRD on hemodialysis ([place])    1. Acute GI bleeding -has received PRBC transfusion.  Follow CBC after transfusion.  We will keep patient n.p.o. in anticipation of possible EGD.  Likely source could be upper GI with history of gastric AVMs previously cauterized in December 2021.  Have sent secure chat message to [place] [facility] Dr. Ardis Hughs.  Protonix IV. 2. Acute blood loss anemia follow CBC after transfusion. 3. ESRD on hemodialysis consult nephrology for dialysis. 4. CAD status post stenting denies any chest pain.  Holding Brilinta due to acute GI bleed. 5. Diabetes mellitus type 2 we will keep patient on sliding scale coverage for now. 6. HIV presently NPO. 7. Recently diagnosed with Covid positive infection last month during last admission.  Presently asymptomatic.  Covid test was  negative this time. 8. History of alcohol abuse -we will keep patient on thiamine and closely monitor for any withdrawal signs.   DVT prophylaxis: SCDs.  Avoiding anticoagulation due to acute GI bleed. Code Status: Full code. Family Communication: Discussed with patient. Disposition Plan: Home. Consults called: [facility] communicated to secure chat. Admission status: Observation.   Rise Patience MD [facility] Pager [phone].  If 7PM-7AM, please contact night-coverage [email or web address] Password [email or web address]  12/22/2020, 1:22 AM

## 2020-12-22 NOTE — Anesthesia Procedure Notes (Signed)
Procedure Name: MAC Performed by: Treyce Spillers B, CRNA Pre-anesthesia Checklist: Patient identified, Emergency Drugs available, Suction available, Patient being monitored and Timeout performed Patient Re-evaluated:Patient Re-evaluated prior to induction Oxygen Delivery Method: Nasal cannula Preoxygenation: Pre-oxygenation with 100% oxygen Induction Type: IV induction Airway Equipment and Method: Bite block Placement Confirmation: positive ETCO2 Dental Injury: Teeth and Oropharynx as per pre-operative assessment        

## 2020-12-22 NOTE — ED Notes (Signed)
Pt had large black tarry stool with clots noted

## 2020-12-22 NOTE — ED Notes (Signed)
Patient assisted back to bed after large tarry stool on floor and in bedside commode. All linens and bed pad changed. He continues to have moaning in his sleep and ringing call bell in his sleep. Doesn't remember what he was needing when Rn responds to bedside. Repeating Q1-3 minutes. Patient reassured that he is ok and positioning aids placed.

## 2020-12-22 NOTE — Progress Notes (Signed)
Pt refused and signed AMA to finish full 3 hour tx. Pt completed only 1:50 and states, "I did 1:40 yesterday so I don't need to do the full tx tonight and I don't feel good". HD RN tried to explain to pt the importance of completed full HD tx , but pt refused to listen. Notified on call Dr. Joelyn Oms on pt signing off. He states, "That's ok"

## 2020-12-22 NOTE — Anesthesia Preprocedure Evaluation (Signed)
Anesthesia Evaluation  Patient identified by MRN, date of birth, ID band Patient awake    Reviewed: Allergy & Precautions, NPO status , Patient's Chart, lab work & pertinent test results, reviewed documented beta blocker date and time   History of Anesthesia Complications Negative for: history of anesthetic complications  Airway Mallampati: II  TM Distance: >3 FB Neck ROM: Full    Dental  (+) Partial Upper, Missing,    Pulmonary COPD,  COPD inhaler, Current Smoker and Patient abstained from smoking.,    Pulmonary exam normal        Cardiovascular hypertension, Pt. on home beta blockers and Pt. on medications + CAD, + Past MI (2005) and +CHF  Normal cardiovascular exam  01/2019 Echo: EF 40-45%, diffuse HK, mild basal septal hypertrophy   Neuro/Psych negative neurological ROS  negative psych ROS   GI/Hepatic GERD  ,(+) Hepatitis -, B  Endo/Other  diabetes, Type 2, Insulin Dependent  Renal/GU Dialysis and ESRFRenal disease  negative genitourinary   Musculoskeletal  (+) Arthritis ,   Abdominal   Peds  Hematology  (+) anemia , HIV, Hgb 6.1, getting 1u pRBCs now   Anesthesia Other Findings Day of surgery medications reviewed with patient.  Reproductive/Obstetrics negative OB ROS                             Anesthesia Physical Anesthesia Plan  ASA: IV  Anesthesia Plan: MAC   Post-op Pain Management:    Induction:   PONV Risk Score and Plan: Treatment may vary due to age or medical condition and Propofol infusion  Airway Management Planned: Natural Airway and Nasal Cannula  Additional Equipment:   Intra-op Plan:   Post-operative Plan:   Informed Consent: I have reviewed the patients History and Physical, chart, labs and discussed the procedure including the risks, benefits and alternatives for the proposed anesthesia with the patient or authorized representative who has indicated  his/her understanding and acceptance.       Plan Discussed with: CRNA  Anesthesia Plan Comments:         Anesthesia Quick Evaluation

## 2020-12-22 NOTE — ED Notes (Signed)
MD paged due to status change in patient

## 2020-12-22 NOTE — Transfer of Care (Signed)
Immediate Anesthesia Transfer of Care Note  Patient: Jacob [name]  Procedure(s) Performed: ESOPHAGOGASTRODUODENOSCOPY (EGD) WITH PROPOFOL (N/A ) HOT HEMOSTASIS (ARGON PLASMA COAGULATION/BICAP) (N/A )  Patient Location: Endoscopy Unit  Anesthesia Type:MAC  Level of Consciousness: drowsy  Airway & Oxygen Therapy: Patient Spontanous Breathing  Post-op Assessment: Report given to RN and Post -op Vital signs reviewed and stable  Post vital signs: Reviewed and stable  Last Vitals:  Vitals Value Taken Time  BP 117/41 12/22/20 1512  Temp 36.8 C 12/22/20 1512  Pulse    Resp 18 12/22/20 1512  SpO2    Vitals shown include unvalidated device data.  Last Pain:  Vitals:   12/22/20 1512  TempSrc: Axillary  PainSc:          Complications: No complications documented.

## 2020-12-22 NOTE — Consult Note (Addendum)
Referring Provider:  [facility]         Primary Care Physician:  [name], NP Primary Gastroenterologist:  Scarlette Shorts, MD             We were asked to see this patient for:    Recurrent GI bleeding              ASSESSMENT / PLAN:   # Recurrent GI bleeding with melena  X 3 days ( on Brillinta) and acute on chronic anemia.  Bleeding probably related to gastric / intestinal AVMs --Hgb 6, down from 8.2 one week ago.  Minimal improvement in hgb after 1 unit of blood >> 6.5.  --Patient is NPO. He will need another EGD. The risks and benefits of EGD were discussed and the patient agrees to proceed.  --Last Brillinta was two days ago per patient. If procedure will not be done today then will give him a diet.   # COVID positive on 11/24/20. Asymptomatic. Negative SARS study yesterday  # CAD / remote CABG / stent Feb 2021 / chronic Brillinta / chronic systolic and diastolic heart failure.   # ESRD on HD Mon,W,Fr  # HIV on therapy  # DM  # Hx of Etoh abuse, none in a year    [facility] Attending   I have taken an interval history, reviewed the chart and examined the patient. I agree with the Advanced Practitioner's note, impression and recommendations.   EGD and Tx appropriate  The risks and benefits as well as alternatives of endoscopic procedure(s) have been discussed and reviewed. All questions answered. The patient agrees to proceed.  Gatha Mayer, MD, [facility] 12/22/2020 1:39 PM         HPI:                                                                                                                             Chief Complaint: black stool  Jacob [name] is a 70 y.o. male with multiple medical problems such as ESRD on HD, DM, HIV on therapy, CAD with prior CABG, HTN,, CHF, COPD, GERD, COVID infection  chronic anemia, intestinal AVMs s/p several endoscopic evaluation with hemostatic therapy  Patient presented to ED yesterday  with black stools x 2 days and hgb of 6, down from 8 a week ago. His dialysis session yesterday was cut short and patient sent to ED. Patient has a history of GI bleeding secondary to intestinal AVMs, last EGD with APC was in late December. Last dose of Brillinta was two days ago. On Sunday patient developed non-radiating stabbing mid upper abdominal discomfort which last until he got to ED yesterday. Pain started after eating fish for dinner. He has never had this discomfort before.  Lipase mildly elevated but this is chronic. Liver chemistries normal. WBC normal.  PREVIOUS ENDOSCOPIC EVALUATIONS / PERTINENT STUDIES   10/19/20 EGD for GI bleed Gastric AVMs s/p APC  treatment. - No specimens collected  12/22/19 EGD --bleeding gastric AVMs  April 2020 EGD --Gastric and duodenal bulb AVMs  Dec 2013 screening colonoscopy --moderate diverticulosis --2 mm polyp ( removed but not retrieved)   Past Medical History:  Diagnosis Date  . Acute respiratory failure ([place]) 03/01/2018  . Arthritis    "all over; mostly knees and back" (02/28/2018)  . Chronic lower back pain    stenosis  . Community acquired pneumonia 09/06/2013  . COPD (chronic obstructive pulmonary disease) ([place])   . Coronary atherosclerosis of native coronary artery    a. 02/2003 s/p CABG x 2 (VG->RI, VG->RPDA; b. 11/2019 PCI: LM nl, LAD 90d, D3 50, RI 100, LCX 100p, OM3 100 - fills via L->L collats from D2/dLAD, RCA 100p, VG->RPDA ok, VG->RI 95 (3.5x48 Synergy XD DES).  . Drug abuse ([place])    hx; tested for cocaine as recently as 2/08. says he is not using drugs now - avoided defib. for this reason   . ESRD (end stage renal disease) ([place])    Hemo M-W-F- Richarda Blade  . Fall at home 10/2020  . GERD (gastroesophageal reflux disease)    takes OTC meds as needed  . GI bleeding    a. 11/2019 EGD: angiodysplastic lesions w/ bleeding s/p argon plasma/clipping/epi inj.  . Glaucoma    uses eye drops daily  . Hepatitis B 1968   "tx'd  w/isolation; caught it from toilet stools in gym"  . HFrEF (heart failure with reduced ejection fraction) ([place])    a. 01/2019 Echo: EF 40-45%, diffuse HK, mild basal septal hypertrophy.  . History of blood transfusion 03/01/2019  . History of colon polyps    benign  . History of gout    takes Allopurinol daily as well as Colchicine-if needed (02/28/2018)  . History of kidney stones   . HTN (hypertension)    takes Coreg,Imdur.and Apresoline daily  . Human immunodeficiency virus (HIV) disease ([place]) dx'd 1995   takes Genvoya daily  . Hyperlipidemia    takes Atorvastatin daily  . Ischemic cardiomyopathy    a. 01/2019 Echo: EF 40-45%, diffuse HK, mild basal septal hypertrophy. Diast dysfxn. Nl RV size/fxn. Sev dil LA. Triv MR/TR/PR.  [name] Muscle spasm    takes Zanaflex as needed  . Myocardial infarction ([place]) ~ 2004/2005  . Nocturia   . Peripheral neuropathy    takes gabapentin daily  . Pneumonia    "at least twice" (02/28/2018)  . Shortness of breath dyspnea    rarely but if notices it then with exertion  . Syphilis, unspecified   . Type II diabetes mellitus ([place]) 2004   Lantus daily.Average fasting blood sugar 125-199  . Wears glasses   . Wears partial dentures     Past Surgical History:  Procedure Laterality Date  . AV FISTULA PLACEMENT Left 08/02/2018   Procedure: ARTERIOVENOUS (AV) FISTULA CREATION  left arm radiocephlic;  Surgeon: Marty Heck, MD;  Location: [place];  Service: Vascular;  Laterality: Left;  . AV FISTULA PLACEMENT Left 08/01/2019   Procedure: LEFT BRACHIOCEPHALIC ARTERIOVENOUS (AV) FISTULA CREATION;  Surgeon: [name], MD;  Location: [place];  Service: Vascular;  Laterality: Left;  . BASCILIC VEIN TRANSPOSITION Left 10/03/2019   Procedure: BASILIC VEIN TRANSPOSITION LEFT SECOND STAGE;  Surgeon: [name], MD;  Location: [place];  Service: Vascular;  Laterality: Left;  . CARDIAC CATHETERIZATION  10/2002; 12/19/2004   [name] 03/08/2011  . COLONOSCOPY  2013    Dr.John Henrene Pastor   .  CORONARY ARTERY BYPASS GRAFT  02/24/2003   CABG X2/notes 03/08/2011  . CORONARY STENT INTERVENTION N/A 12/19/2019   Procedure: CORONARY STENT INTERVENTION;  Surgeon: Jettie Booze, MD;  Location: [place] [facility];  Service: Cardiovascular;  Laterality: N/A;  . ESOPHAGOGASTRODUODENOSCOPY (EGD) WITH PROPOFOL N/A 02/08/2019   Procedure: ESOPHAGOGASTRODUODENOSCOPY (EGD) WITH PROPOFOL;  Surgeon: Milus Banister, MD;  Location: [place];  Service: Gastroenterology;  Laterality: N/A;  . ESOPHAGOGASTRODUODENOSCOPY (EGD) WITH PROPOFOL N/A 12/22/2019   Procedure: ESOPHAGOGASTRODUODENOSCOPY (EGD) WITH PROPOFOL;  Surgeon: Lavena Bullion, DO;  Location: [place];  Service: Gastroenterology;  Laterality: N/A;  . ESOPHAGOGASTRODUODENOSCOPY (EGD) WITH PROPOFOL N/A 10/19/2020   Procedure: ESOPHAGOGASTRODUODENOSCOPY (EGD) WITH PROPOFOL;  Surgeon: Jackquline Denmark, MD;  Location: [facility] ENDOSCOPY;  Service: Endoscopy;  Laterality: N/A;  . HEMOSTASIS CLIP PLACEMENT  12/22/2019   Procedure: HEMOSTASIS CLIP PLACEMENT;  Surgeon: Lavena Bullion, DO;  Location: [place];  Service: Gastroenterology;;  . HOT HEMOSTASIS N/A 02/08/2019   Procedure: HOT HEMOSTASIS (ARGON PLASMA COAGULATION/BICAP);  Surgeon: Milus Banister, MD;  Location: [facility] ENDOSCOPY;  Service: Gastroenterology;  Laterality: N/A;  . HOT HEMOSTASIS N/A 12/22/2019   Procedure: HOT HEMOSTASIS (ARGON PLASMA COAGULATION/BICAP);  Surgeon: Lavena Bullion, DO;  Location: [facility] ENDOSCOPY;  Service: Gastroenterology;  Laterality: N/A;  . HOT HEMOSTASIS N/A 10/19/2020   Procedure: HOT HEMOSTASIS (ARGON PLASMA COAGULATION/BICAP);  Surgeon: Jackquline Denmark, MD;  Location: [facility] ENDOSCOPY;  Service: Endoscopy;  Laterality: N/A;  . INTERTROCHANTERIC HIP FRACTURE SURGERY Left 11/2006   [name] 03/08/2011  . INTRAVASCULAR ULTRASOUND/IVUS N/A 12/19/2019   Procedure: Intravascular Ultrasound/IVUS;  Surgeon: Jettie Booze, MD;  Location: [place] CV [facility];  Service: Cardiovascular;  Laterality: N/A;  . IR FLUORO GUIDE CV LINE RIGHT  07/24/2019  . IR FLUORO GUIDE CV LINE RIGHT  07/30/2019  . IR US GUIDE VASC ACCESS RIGHT  07/24/2019  . IR US GUIDE VASC ACCESS RIGHT  07/30/2019  . LAPAROSCOPIC CHOLECYSTECTOMY  05/2006  . LIGATION OF COMPETING BRANCHES OF ARTERIOVENOUS FISTULA Left 11/05/2018   Procedure: LIGATION OF COMPETING BRANCHES OF ARTERIOVENOUS FISTULA  LEFT  ARM;  Surgeon: Marty Heck, MD;  Location: [place];  Service: Vascular;  Laterality: Left;  . LUMBAR LAMINECTOMY/DECOMPRESSION MICRODISCECTOMY N/A 02/29/2016   Procedure: Left L4-5 Lateral Recess Decompression, Removal Extradural Intraspinal Facet Cyst;  Surgeon: Marybelle Killings, MD;  Location: [place];  Service: Orthopedics;  Laterality: N/A;  . MULTIPLE TOOTH EXTRACTIONS    . ORIF MANDIBULAR FRACTURE Left 08/13/2004   ORIF of left body fracture mandible with KLS Martin 2.3-mm six hole/notes 03/08/2011  . RIGHT/LEFT HEART CATH AND CORONARY/GRAFT ANGIOGRAPHY N/A 12/19/2019   Procedure: RIGHT/LEFT HEART CATH AND CORONARY/GRAFT ANGIOGRAPHY;  Surgeon: Jettie Booze, MD;  Location: [place] [facility];  Service: Cardiovascular;  Laterality: N/A;  . SCLEROTHERAPY  12/22/2019   Procedure: SCLEROTHERAPY;  Surgeon: Lavena Bullion, DO;  Location: [facility] ENDOSCOPY;  Service: Gastroenterology;;    Prior to Admission medications   Medication Sig Start Date End Date Taking? Authorizing Provider  acetaminophen (TYLENOL) 650 MG CR tablet Take 650 mg by mouth 3 (three) times daily.    [provider]  allopurinol (ZYLOPRIM) 100 MG tablet TAKE 1 TABLET(100 MG) BY MOUTH DAILY 07/23/20   [name], NP  ANORO ELLIPTA 62.5-25 MCG/INH AEPB INHALE 1 PUFF BY MOUTH EVERY DAY 12/21/20   [name], NP  B-D UF III MINI PEN NEEDLES 31G X 5 MM MISC USE FOUR TIMES DAILY 03/02/20  [name], NP  bictegravir-emtricitabine-tenofovir AF (BIKTARVY) 50-200-25 MG TABS  tablet Take 1 tablet by mouth daily. 06/30/20   [name], MD  carvedilol (COREG) 3.125 MG tablet Take 1 tablet (3.125 mg total) by mouth 2 (two) times daily. 02/13/20   [name], MD  Cinacalcet HCl (SENSIPAR PO) Take by mouth.  08/14/20 08/13/21  [provider]  colchicine 0.6 MG tablet Take 0.6-1.2 mg by mouth daily as needed (gout flare-up).    [provider]  diclofenac Sodium (VOLTAREN) 1 % GEL APPLY 2 GRAMS TO THE AFFECTED AREA THREE TIMES DAILY AS NEEDED FOR PAIN 11/16/20   [name], NP  folic acid (FOLVITE) 1 MG tablet Take 1 tablet (1 mg total) by mouth daily. 11/30/20   [name], NP  gabapentin (NEURONTIN) 300 MG capsule Take 1 capsule (300 mg total) by mouth daily. Dose change due to renal function 12/09/20   [name], NP  insulin lispro (HUMALOG KWIKPEN) 100 UNIT/ML KwikPen Inject 0.57ml (20 Units total) into the skin 3 (three) times daily as needed for high blood sugar (above 150) Patient taking differently: Inject 0.59ml (20 Units total) into the skin 3 (three) times daily as needed for high blood sugar (above 150) 03/12/20   [name], NP  iron sucrose in sodium chloride 0.9 % 100 mL Iron Sucrose (Venofer) 07/08/20 07/07/21  [provider]  isosorbide mononitrate (IMDUR) 30 MG 24 hr tablet TAKE 1 TABLET BY MOUTH EVERY DAY 08/10/20   [name], NP  Lancets (ONETOUCH DELICA PLUS [id number]) MISC Inject 1 Device as directed 3 (three) times daily. Dx: E11.40 09/18/19   [name], NP  latanoprost (XALATAN) 0.005 % ophthalmic solution Place 1 drop into both eyes at bedtime.    [provider]  Methoxy PEG-Epoetin Beta (MIRCERA IJ) Mircera 07/06/20   [provider]  multivitamin (RENA-VIT) TABS tablet Take 1 tablet by mouth daily.    [provider]  nitroGLYCERIN (NITROSTAT) 0.3 MG SL tablet ONE TABLET UNDER TONGUE AS NEEDED FOR CHEST PAIN 05/01/19   [name], MD  Nutritional  Supplements (VITAMIN D BOOSTER PO) Take 1 each by mouth 2 (two) times daily. 08/12/20 08/11/21  [provider]  ONETOUCH ULTRA test strip CHECK BLOOD SUGAR 3 TIMES  DAILY 05/27/20   [name], NP  pantoprazole (PROTONIX) 40 MG tablet TAKE 1 TABLET(40 MG) BY MOUTH TWICE DAILY 10/19/20   Fayrene Helper, MD  polyethylene glycol (MIRALAX / GLYCOLAX) 17 g packet Take 17 g by mouth daily as needed for mild constipation. 11/07/20   Aline August, MD  sevelamer carbonate (RENVELA) 800 MG tablet Take 800 mg by mouth 3 (three) times daily with meals.    [provider]  ticagrelor (BRILINTA) 90 MG TABS tablet Take 90 mg by mouth 2 (two) times daily.    [provider]  timolol (TIMOPTIC) 0.5 % ophthalmic solution Place 1 drop into both eyes daily. 04/04/19   [provider]  traMADol (ULTRAM) 50 MG tablet Take 1 tablet (50 mg total) by mouth 2 (two) times daily as needed. 11/20/20   Marybelle Killings, MD    Current Facility-Administered Medications  Medication Dose Route Frequency Provider Last Rate Last Admin  . 0.9 %  sodium chloride infusion  100 mL Intravenous PRN Tobie Poet E, NP      . 0.9 %  sodium chloride infusion  100 mL Intravenous PRN Adelfa Koh, NP      .  alteplase (CATHFLO ACTIVASE) injection 2 mg  2 mg Intracatheter Once PRN Adelfa Koh, NP      . Chlorhexidine Gluconate Cloth 2 % PADS 6 each  6 each Topical Q0600 Adelfa Koh, NP      . heparin injection 1,000 Units  1,000 Units Dialysis PRN Adelfa Koh, NP      . insulin aspart (novoLOG) injection 0-6 Units  0-6 Units Subcutaneous Q4H Rise Patience, MD      . latanoprost (XALATAN) 0.005 % ophthalmic solution 1 drop  1 drop Both Eyes QHS Rise Patience, MD      . lidocaine (PF) (XYLOCAINE) 1 % injection 5 mL  5 mL Intradermal PRN Adelfa Koh, NP      . lidocaine-prilocaine (EMLA) cream 1 application  1 application Topical PRN  Adelfa Koh, NP      . pantoprazole (PROTONIX) injection 40 mg  40 mg Intravenous Q12H Rise Patience, MD   40 mg at 12/22/20 0143  . pentafluoroprop-tetrafluoroeth (GEBAUERS) aerosol 1 application  1 application Topical PRN Tobie Poet E, NP      . thiamine (B-1) injection 100 mg  100 mg Intravenous Daily Rise Patience, MD      . timolol (TIMOPTIC) 0.5 % ophthalmic solution 1 drop  1 drop Both Eyes Daily Rise Patience, MD      . umeclidinium-vilanterol (ANORO ELLIPTA) 62.5-25 MCG/INH 1 puff  1 puff Inhalation Daily Rise Patience, MD       Current Outpatient Medications  Medication Sig Dispense Refill  . acetaminophen (TYLENOL) 650 MG CR tablet Take 650 mg by mouth 3 (three) times daily.    [name] allopurinol (ZYLOPRIM) 100 MG tablet TAKE 1 TABLET(100 MG) BY MOUTH DAILY 90 tablet 1  . ANORO ELLIPTA 62.5-25 MCG/INH AEPB INHALE 1 PUFF BY MOUTH EVERY DAY 60 each 5  . B-D UF III MINI PEN NEEDLES 31G X 5 MM MISC USE FOUR TIMES DAILY 100 each 11  . bictegravir-emtricitabine-tenofovir AF (BIKTARVY) 50-200-25 MG TABS tablet Take 1 tablet by mouth daily. 30 tablet 11  . carvedilol (COREG) 3.125 MG tablet Take 1 tablet (3.125 mg total) by mouth 2 (two) times daily. 180 tablet 3  . Cinacalcet HCl (SENSIPAR PO) Take by mouth.     . colchicine 0.6 MG tablet Take 0.6-1.2 mg by mouth daily as needed (gout flare-up).    . diclofenac Sodium (VOLTAREN) 1 % GEL APPLY 2 GRAMS TO THE AFFECTED AREA THREE TIMES DAILY AS NEEDED FOR PAIN 921 g 0  . folic acid (FOLVITE) 1 MG tablet Take 1 tablet (1 mg total) by mouth daily. 90 tablet 0  . gabapentin (NEURONTIN) 300 MG capsule Take 1 capsule (300 mg total) by mouth daily. Dose change due to renal function 90 capsule 1  . insulin lispro (HUMALOG KWIKPEN) 100 UNIT/ML KwikPen Inject 0.25ml (20 Units total) into the skin 3 (three) times daily as needed for high blood sugar (above 150) (Patient taking differently: Inject 0.80ml (20 Units  total) into the skin 3 (three) times daily as needed for high blood sugar (above 150)) 15 pen 1  . iron sucrose in sodium chloride 0.9 % 100 mL Iron Sucrose (Venofer)    . isosorbide mononitrate (IMDUR) 30 MG 24 hr tablet TAKE 1 TABLET BY MOUTH EVERY DAY 90 tablet 1  . Lancets (ONETOUCH DELICA PLUS [id number]) MISC Inject 1 Device as directed 3 (three) times daily. Dx: E11.40 300 each 3  . latanoprost (  XALATAN) 0.005 % ophthalmic solution Place 1 drop into both eyes at bedtime.    . Methoxy PEG-Epoetin Beta (MIRCERA IJ) Mircera    . multivitamin (RENA-VIT) TABS tablet Take 1 tablet by mouth daily.    . nitroGLYCERIN (NITROSTAT) 0.3 MG SL tablet ONE TABLET UNDER TONGUE AS NEEDED FOR CHEST PAIN 25 tablet 4  . Nutritional Supplements (VITAMIN D BOOSTER PO) Take 1 each by mouth 2 (two) times daily.    Glory Rosebush ULTRA test strip CHECK BLOOD SUGAR 3 TIMES  DAILY 300 strip 3  . pantoprazole (PROTONIX) 40 MG tablet TAKE 1 TABLET(40 MG) BY MOUTH TWICE DAILY 180 tablet 0  . polyethylene glycol (MIRALAX / GLYCOLAX) 17 g packet Take 17 g by mouth daily as needed for mild constipation. 14 each 0  . sevelamer carbonate (RENVELA) 800 MG tablet Take 800 mg by mouth 3 (three) times daily with meals.    . ticagrelor (BRILINTA) 90 MG TABS tablet Take 90 mg by mouth 2 (two) times daily.    . timolol (TIMOPTIC) 0.5 % ophthalmic solution Place 1 drop into both eyes daily.    . traMADol (ULTRAM) 50 MG tablet Take 1 tablet (50 mg total) by mouth 2 (two) times daily as needed. 20 tablet 0    Allergies as of 12/21/2020 - Review Complete 12/21/2020  Allergen Reaction Noted  . Augmentin [amoxicillin-pot clavulanate] Diarrhea 12/05/2018  . Mucinex fast-max Other (See Comments) 11/30/2020  . Amphetamines Other (See Comments) 01/15/2012    Family History  Problem Relation Age of Onset  . Heart failure Father   . Hypertension Father   . Diabetes Brother   . Heart attack Brother   . Alzheimer's disease Mother   .  Stroke Sister   . Diabetes Sister   . Alzheimer's disease Sister   . Hypertension Brother   . Diabetes Brother   . Drug abuse Brother   . Colon cancer Neg Hx     Social History   Socioeconomic History  . Marital status: Single    Spouse name: Not on file  . Number of children: 1  . Years of education: Not on file  . Highest education level: Not on file  Occupational History  . Occupation: retired  Tobacco Use  . Smoking status: Current Every Day Smoker    Packs/day: 0.50    Years: 43.00    Pack years: 21.50    Types: Cigarettes  . Smokeless tobacco: Never Used  Vaping Use  . Vaping Use: Never used  Substance and Sexual Activity  . Alcohol use: Not Currently    Alcohol/week: 12.0 standard drinks    Types: 12 Standard drinks or equivalent per week  . Drug use: Not Currently    Types: Cocaine    Comment: hx of crack/cocaine 10yrs ago 10/01/2019- none  . Sexual activity: Yes    Partners: Male  Other Topics Concern  . Not on file  Social History Narrative   Retired.       As of 05/2015:   Diet: No salt    Caffeine   Married: Single    House: Condo, 2 stories, 1 person (self)    Pets: No    Current/Past profession: N/A   Exercise: walks daily    Living Will: Yes    DNR   POA/HPOA: No       Social Determinants of Health   Financial Resource Strain: Low Risk   . Difficulty of Paying Living Expenses: Not hard at all  Food Insecurity: No Food Insecurity  . Worried About Charity fundraiser in the Last Year: Never true  . Ran Out of Food in the Last Year: Never true  Transportation Needs: No Transportation Needs  . Lack of Transportation (Medical): No  . Lack of Transportation (Non-Medical): No  Physical Activity: Not on file  Stress: Not on file  Social Connections: Not on file  Intimate Partner Violence: Not on file    Review of Systems: All systems reviewed and negative except where noted in HPI.  OBJECTIVE:    Physical Exam: Vital signs in last 24  hours: Temp:  [97.1 F (36.2 C)-98.8 F (37.1 C)] 98 F (36.7 C) (03/01 0125) Pulse Rate:  [47-108] 99 (03/01 0645) Resp:  [12-33] 15 (03/01 0645) BP: (90-135)/(41-121) 109/52 (03/01 0645) SpO2:  [70 %-100 %] 99 % (03/01 0645)   General:   Alert  male in NAD Psych:   cooperative. Slightly agitated. Eyes:  Pupils equal, sclera clear, no icterus.   Conjunctiva pink. Ears:  Normal auditory acuity. Nose:  No deformity, discharge,  or lesions. Neck:  Supple; no masses Lungs:  Clear throughout to auscultation.   No wheezes, crackles, or rhonchi.  Heart:  Regular rate and rhythm; no lower extremity edema Abdomen:  Soft, non-distended, nontender, BS active, no palp mass   Rectal:  Deferred  Msk:  Symmetrical without gross deformities. . Neurologic:  Alert and  oriented x4;  grossly normal neurologically. Skin:  Intact without significant lesions or rashes.  There were no vitals filed for this visit.   Scheduled inpatient medications . Chlorhexidine Gluconate Cloth  6 each Topical Q0600  . insulin aspart  0-6 Units Subcutaneous Q4H  . latanoprost  1 drop Both Eyes QHS  . pantoprazole (PROTONIX) IV  40 mg Intravenous Q12H  . thiamine injection  100 mg Intravenous Daily  . timolol  1 drop Both Eyes Daily  . umeclidinium-vilanterol  1 puff Inhalation Daily      Intake/Output from previous day: 02/28 0701 - 03/01 0700 In: 723 [I.V.:40; Blood:683] Out: -  Intake/Output this shift: No intake/output data recorded.   Lab Results: Recent Labs    12/21/20 0834 12/21/20 2052 12/22/20 0223  WBC 7.5 7.1 7.6  HGB 6.2* 6.0* 6.5*  HCT 20.6* 19.3* 20.9*  PLT 168 147* 129*   BMET Recent Labs    12/21/20 0834 12/21/20 2052 12/22/20 0223  NA 141 142 140  K 3.8 4.1 4.2  CL 101 103 102  CO2 26 24 23   GLUCOSE 130* 85 109*  BUN 81* 121* 135*  CREATININE 5.47* 7.43* 7.37*  CALCIUM 8.7* 9.1 9.0   LFT Recent Labs    12/21/20 0834  PROT 6.8  ALBUMIN 2.8*  AST 16  ALT 9   ALKPHOS 79  BILITOT 0.8   PT/INR No results for input(s): LABPROT, INR in the last 72 hours. Hepatitis Panel No results for input(s): HEPBSAG, HCVAB, HEPAIGM, HEPBIGM in the last 72 hours.   . CBC Latest Ref Rng & Units 12/22/2020 12/21/2020 12/21/2020  WBC 4.0 - 10.5 K/uL 7.6 7.1 7.5  Hemoglobin 13.0 - 17.0 g/dL 6.5(LL) 6.0(LL) 6.2(LL)  Hematocrit 39.0 - 52.0 % 20.9(L) 19.3(L) 20.6(L)  Platelets 150 - 400 K/uL 129(L) 147(L) 168    . CMP Latest Ref Rng & Units 12/22/2020 12/21/2020 12/21/2020  Glucose 70 - 99 mg/dL 109(H) 85 130(H)  BUN 8 - 23 mg/dL 135(H) 121(H) 81(H)  Creatinine 0.61 - 1.24 mg/dL 7.37(H) 7.43(H) 5.47(H)  Sodium 135 - 145 mmol/L 140 142 141  Potassium 3.5 - 5.1 mmol/L 4.2 4.1 3.8  Chloride 98 - 111 mmol/L 102 103 101  CO2 22 - 32 mmol/L 23 24 26   Calcium 8.9 - 10.3 mg/dL 9.0 9.1 8.7(L)  Total Protein 6.5 - 8.1 g/dL - - 6.8  Total Bilirubin 0.3 - 1.2 mg/dL - - 0.8  Alkaline Phos 38 - 126 U/L - - 79  AST 15 - 41 U/L - - 16  ALT 0 - 44 U/L - - 9   Studies/Results: DG Abdomen Acute W/Chest  Result Date: 12/21/2020 CLINICAL DATA:  Abdominal pain, vomiting EXAM: DG ABDOMEN ACUTE WITH 1 VIEW CHEST COMPARISON:  07/23/2019 FINDINGS: Prior CABG. Cardiomegaly. Aortic atherosclerosis. Areas of scarring in the upper lobes bilaterally. No acute confluent opacities or effusions. Prior cholecystectomy. Nonobstructive bowel gas pattern. No free air or organomegaly. Vascular calcifications noted. No visible aneurysm. IMPRESSION: No bowel obstruction or free air. Cardiomegaly.  No acute cardiopulmonary disease. Electronically Signed   By: Rolm Baptise M.D.   On: 12/21/2020 09:41    Principal Problem:   Acute GI bleeding Active Problems:   Human immunodeficiency virus (HIV) disease ([place])   Essential hypertension   Coronary artery disease involving native heart without angina pectoris   Type 2 diabetes mellitus with diabetic polyneuropathy, with long-term current use of insulin  ([place])   Gastric AVM   ESRD on hemodialysis ([place])    [name] Savoy, NP-C @  12/22/2020, 9:31 AM

## 2020-12-22 NOTE — ED Notes (Signed)
Pt very verbally aggressive demanding that bedside commode be provided immediately, This nurse and tech attempting to find, one when pt continues to call out on call light. Explained to pt that every time search efforts are interrupted to answer light it takes longer to find commode. Pt then mumbling rude comments toward staff. Pt upset that tissues provided for cleaning pt up, demanding wash cloths. Wet wash cloths provided for pt. Pt sitting on bedside commode at this time.

## 2020-12-23 ENCOUNTER — Ambulatory Visit: Payer: Medicare Other | Admitting: Podiatry

## 2020-12-23 ENCOUNTER — Encounter (HOSPITAL_COMMUNITY): Payer: Self-pay | Admitting: Internal Medicine

## 2020-12-23 ENCOUNTER — Telehealth: Payer: Self-pay

## 2020-12-23 DIAGNOSIS — K922 Gastrointestinal hemorrhage, unspecified: Secondary | ICD-10-CM | POA: Diagnosis not present

## 2020-12-23 LAB — BASIC METABOLIC PANEL
Anion gap: 14 (ref 5–15)
BUN: 107 mg/dL — ABNORMAL HIGH (ref 8–23)
CO2: 23 mmol/L (ref 22–32)
Calcium: 8.4 mg/dL — ABNORMAL LOW (ref 8.9–10.3)
Chloride: 106 mmol/L (ref 98–111)
Creatinine, Ser: 5.75 mg/dL — ABNORMAL HIGH (ref 0.61–1.24)
GFR, Estimated: 10 mL/min — ABNORMAL LOW (ref >60)
Glucose, Bld: 110 mg/dL — ABNORMAL HIGH (ref 70–99)
Potassium: 3.6 mmol/L (ref 3.5–5.1)
Sodium: 143 mmol/L (ref 135–145)

## 2020-12-23 LAB — CBC
HCT: 20.6 % — ABNORMAL LOW (ref 39.0–52.0)
Hemoglobin: 7.2 g/dL — ABNORMAL LOW (ref 13.0–17.0)
MCH: 32.1 pg (ref 26.0–34.0)
MCHC: 35 g/dL (ref 30.0–36.0)
MCV: 92 fL (ref 80.0–100.0)
Platelets: 108 10*3/uL — ABNORMAL LOW (ref 150–400)
RBC: 2.24 MIL/uL — ABNORMAL LOW (ref 4.22–5.81)
RDW: 18.2 % — ABNORMAL HIGH (ref 11.5–15.5)
WBC: 7.5 10*3/uL (ref 4.0–10.5)
nRBC: 0 % (ref 0.0–0.2)

## 2020-12-23 LAB — GLUCOSE, CAPILLARY
Glucose-Capillary: 110 mg/dL — ABNORMAL HIGH (ref 70–99)
Glucose-Capillary: 185 mg/dL — ABNORMAL HIGH (ref 70–99)
Glucose-Capillary: 208 mg/dL — ABNORMAL HIGH (ref 70–99)
Glucose-Capillary: 88 mg/dL (ref 70–99)
Glucose-Capillary: 88 mg/dL (ref 70–99)
Glucose-Capillary: 97 mg/dL (ref 70–99)

## 2020-12-23 LAB — MAGNESIUM: Magnesium: 1.9 mg/dL (ref 1.7–2.4)

## 2020-12-23 LAB — HEMOGLOBIN AND HEMATOCRIT, BLOOD
HCT: 23 % — ABNORMAL LOW (ref 39.0–52.0)
Hemoglobin: 7.9 g/dL — ABNORMAL LOW (ref 13.0–17.0)

## 2020-12-23 LAB — PREPARE RBC (CROSSMATCH)

## 2020-12-23 MED ORDER — DIPHENHYDRAMINE HCL 25 MG PO CAPS
25.0000 mg | ORAL_CAPSULE | Freq: Once | ORAL | Status: AC
  Administered 2020-12-23: 25 mg via ORAL
  Filled 2020-12-23: qty 1

## 2020-12-23 MED ORDER — BICTEGRAVIR-EMTRICITAB-TENOFOV 50-200-25 MG PO TABS
1.0000 | ORAL_TABLET | Freq: Every day | ORAL | Status: DC
  Administered 2020-12-23 – 2020-12-24 (×2): 1 via ORAL
  Filled 2020-12-23 (×2): qty 1

## 2020-12-23 MED ORDER — SODIUM CHLORIDE 0.9% IV SOLUTION: Freq: Once | INTRAVENOUS | Status: AC

## 2020-12-23 MED ORDER — PANTOPRAZOLE SODIUM 40 MG PO TBEC
40.0000 mg | DELAYED_RELEASE_TABLET | Freq: Every day | ORAL | Status: DC
  Administered 2020-12-24: 40 mg via ORAL
  Filled 2020-12-23: qty 1

## 2020-12-23 MED ORDER — DARBEPOETIN ALFA 200 MCG/0.4ML IJ SOSY
200.0000 ug | PREFILLED_SYRINGE | Freq: Once | INTRAMUSCULAR | Status: AC
  Administered 2020-12-23: 200 ug via SUBCUTANEOUS
  Filled 2020-12-23: qty 0.4

## 2020-12-23 MED ORDER — MELATONIN 3 MG PO TABS
3.0000 mg | ORAL_TABLET | Freq: Every evening | ORAL | Status: DC | PRN
  Administered 2020-12-23: 3 mg via ORAL
  Filled 2020-12-23: qty 1

## 2020-12-23 MED ORDER — ACETAMINOPHEN 325 MG PO TABS
650.0000 mg | ORAL_TABLET | Freq: Four times a day (QID) | ORAL | Status: DC | PRN
  Administered 2020-12-23 (×2): 650 mg via ORAL
  Filled 2020-12-23 (×2): qty 2

## 2020-12-23 MED ORDER — CHLORHEXIDINE GLUCONATE CLOTH 2 % EX PADS: 6.0000 | MEDICATED_PAD | Freq: Every day | CUTANEOUS | Status: DC

## 2020-12-23 MED ORDER — PANTOPRAZOLE SODIUM 40 MG PO TBEC: 40.0000 mg | DELAYED_RELEASE_TABLET | Freq: Two times a day (BID) | ORAL | Status: DC

## 2020-12-23 MED ORDER — DARBEPOETIN ALFA 200 MCG/0.4ML IJ SOSY
200.0000 ug | PREFILLED_SYRINGE | Freq: Once | INTRAMUSCULAR | Status: DC
  Filled 2020-12-23: qty 0.4

## 2020-12-23 MED ORDER — ACETAMINOPHEN 325 MG PO TABS
650.0000 mg | ORAL_TABLET | Freq: Once | ORAL | Status: AC
  Administered 2020-12-23: 650 mg via ORAL
  Filled 2020-12-23: qty 2

## 2020-12-23 NOTE — Progress Notes (Signed)
PROGRESS NOTE    COSTAS SENA  QIO:[id number] DOB: [birth year] DOA: 12/21/2020 PCP: [name], NP    Brief Narrative:  Jacob [name] is a 70 y.o. male with history of recurrent GI bleed 2/2 AVMs, CAD on Brilinta, HIV, ESRD on HD MWF, essential hypertension, COPD, type 2 diabetes mellitus, Hx Covid-19 viral infection who presented to the ED with frank rectal bleeding. Patient was seen in the ED earlier that day in which his hemoglobin was found to be six he was transfused 1 unit PRBC and he left AGAINST MEDICAL ADVICE. Patient then returned to the ED after multiple episodes of blood in the stool. Patient denies any abdominal pain, no nausea/vomiting, no chest pain, no shortness of breath.  Patient admitted December 2021 with EGD showing gastric AVMs which were cauterized. Bleeding complicated by use of Brilinta.  In the ER, 7.7 F, HR 100, RR 23, BP 90/46, SPO2 100% on room air. 141, potassium 3.8, chloride 101, CO2 26, glucose 130, BUN 81, creatinine 5.47. Lipase 73, AST 16, ALT nine, total bilirubin 0.8. WBC 7.5, hemoglobin 6.2, MCV 113.0, platelets 168. FOBT positive. Acute abdominal series with no obstruction or free air, cardiomegaly and no acute cardiopulmonary disease process. Covid-19/influenza A/B PCR negative. Patient was transfused 1 unit PRBC. Gastroenterology was consulted. Hospital service consulted for further evaluation and management of acute GI bleed likely secondary to gastric AVMs.   Assessment & Plan:   Principal Problem:   Acute GI bleeding Active Problems:   Human immunodeficiency virus (HIV) disease ([place])   Essential hypertension   Coronary artery disease involving native heart without angina pectoris   Type 2 diabetes mellitus with diabetic polyneuropathy, with long-term current use of insulin (H[facility])   Gastric AVM   ESRD on hemodialysis ([place])   Duodenal hemorrhage   GI bleed likely secondary to AVMs, recurrent Patient representing to the ED with bloody  stools, low been 6.2. History of AVMs requiring cauterization in December 2021. This is complicated by use of Brilinta. EGD 3/1 with findings of blood in the duodenal bulb nodule and duodenum with steady nonpulsatile bleeding status post APC and clipping x4. --[facility] following; appreciate assistance --s/p 3u pRBC 3/1; 1u pRBC 3/2 --Hgb 6.2>6.1>7.2>7.9 --Protonix gtt x 48hrs followed by Protonix 40 mg p.o. twice daily x 4 weeks then once daily. --Continue to hold Brilinta --monitor H/H q6h  Essential hypertension CAD s/p DES Patient underwent heart catheterization 12/19/2019 with DES placements. Continues on Brilinta outpatient. Hypotensive on arrival likely secondary to GI bleed. --Hold home carvedilol 3.125mg  BID and isosorbide mononitrate 30 mg PO daily --Transfusion as above. --Otila Kluver monitor blood pressure closely; currently asymptomatic with mild hypotension  COPD Continue Anoro Ellipta 1 puff daily  ESRD on HD Dialy[facility] on a Monday/Wednesday/Friday schedule. Partial HD session on 12/21/20. Potassium within normal limits, BUN 324; complicated by GI bleed as above. --Nephrology consulted to continue HD while inpatient; signed off early yesterday from HD session  Type 2 diabetes mellitus Hemoglobin A1c 4.6 on 12/15/2020, well controlled. Home regimen includes Humalog 20 units TID prn for glucose above 150. --SSI for coverage --CBDs qAC/HS  HIV CD4 count 36 06/30/2020. Follows with ID, Dr. [name] outpatient. --Restart Biktarvy   History of Covid-19 viral infection Patient tested positive for Covid-19 on 11/24/2020, was asymptomatic. Repeat Covid-19 PCR on admission negative. Oxygenating well on room air. Is fully vaccinated against Covid-19 with [employer] vaccination.    DVT prophylaxis: SCDs, chemical DVT prophylaxis contraindicated in acute GI bleed  Code Status: Full Code Family Communication: Updated patient extensively at bedside, no family present.  Disposition Plan:  Level  of care: Telemetry Medical Status is: Inpatient  Remains inpatient appropriate because:Ongoing diagnostic testing needed not appropriate for outpatient work up, Unsafe d/c plan, IV treatments appropriate due to intensity of illness or inability to take PO and Inpatient level of care appropriate due to severity of illness   Dispo: The patient is from: Home              Anticipated d/c is to: Home              Patient currently is not medically stable to d/c.   Difficult to place patient No   Consultants:   [facility]  Procedures:   None  Antimicrobials:   None   Subjective: Patient seen and examined bedside, resting comfortably.  Patient angry that he cannot eat and would like to be discharged.  Discussed with him continues on Protonix drip for a total of 48 hours and needed additional unit of blood transfused this morning.  States he feels like he is in a "prison". No other complaints or concerns at this time. Denies headache, no chest pain, no palpitations, no shortness of breath. No acute concerns this morning per nursing staff.  Objective: Vitals:   12/23/20 0421 12/23/20 0451 12/23/20 0733 12/23/20 0913  BP: (!) 107/48 (!) 113/58 (!) 119/53   Pulse: 78 87 87   Resp: 18  16   Temp: 98 F (36.7 C) 98.6 F (37 C) 98.5 F (36.9 C)   TempSrc:  Axillary    SpO2: 100% 100% 100% 100%  Weight:      Height:        Intake/Output Summary (Last 24 hours) at 12/23/2020 1111 Last data filed at 12/23/2020 0800 Gross per 24 hour  Intake 3122.5 ml  Output 0 ml  Net 3122.5 ml   Filed Weights   12/22/20 1328 12/22/20 1900 12/22/20 2100  Weight: 80.3 kg 81.8 kg 81.8 kg    Examination:  General exam: Slightly irritated/agitated, chronically ill in appearance Respiratory system: Clear to auscultation. Respiratory effort normal. Oxygenating well on room air Cardiovascular system: S1 & S2 heard, RRR. No JVD, murmurs, rubs, gallops or clicks. No pedal  edema. Gastrointestinal system: Abdomen is nondistended, soft and nontender. No organomegaly or masses felt. Normal bowel sounds heard. Central nervous system: Alert and oriented. No focal neurological deficits. Extremities: Symmetric 5 x 5 power. Skin: No rashes, lesions or ulcers Psychiatry: Judgement and insight appear poor. Mood & affect appropriate.     Data Reviewed: I have personally reviewed following labs and imaging studies  CBC: Recent Labs  Lab 12/21/20 0834 12/21/20 2052 12/22/20 0223 12/22/20 0932 12/23/20 0125 12/23/20 0859  WBC 7.5 7.1 7.6  --  7.5  --   NEUTROABS 5.8  --   --   --   --   --   HGB 6.2* 6.0* 6.5* 6.1* 7.2* 7.9*  HCT 20.6* 19.3* 20.9* 18.3* 20.6* 23.0*  MCV 103.0* 99.0 98.1  --  92.0  --   PLT 168 147* 129*  --  108*  --    Basic Metabolic Panel: Recent Labs  Lab 12/21/20 0834 12/21/20 2052 12/22/20 0223 12/23/20 0125  NA 141 142 140 143  K 3.8 4.1 4.2 3.6  CL 101 103 102 106  CO2 26 24 23 23   GLUCOSE 130* 85 109* 110*  BUN 81* 121* 135* 107*  CREATININE 5.47* 7.43* 7.37* 5.75*  CALCIUM 8.7* 9.1 9.0 8.4*  MG  --   --   --  1.9   GFR: Estimated Creatinine Clearance: 13.6 mL/min (A) (by C-G formula based on SCr of 5.75 mg/dL (H)). Liver Function Tests: Recent Labs  Lab 12/21/20 0834  AST 16  ALT 9  ALKPHOS 79  BILITOT 0.8  PROT 6.8  ALBUMIN 2.8*   Recent Labs  Lab 12/21/20 0834  LIPASE 73*   No results for input(s): AMMONIA in the last 168 hours. Coagulation Profile: No results for input(s): INR, PROTIME in the last 168 hours. Cardiac Enzymes: No results for input(s): CKTOTAL, CKMB, CKMBINDEX, TROPONINI in the last 168 hours. BNP (last 3 results) No results for input(s): PROBNP in the last 8760 hours. HbA1C: No results for input(s): HGBA1C in the last 72 hours. CBG: Recent Labs  Lab 12/22/20 1820 12/22/20 2136 12/22/20 2348 12/23/20 0423 12/23/20 0729  GLUCAP 107* 114* 104* 110* 97   Lipid Profile: No  results for input(s): CHOL, HDL, LDLCALC, TRIG, CHOLHDL, LDLDIRECT in the last 72 hours. Thyroid Function Tests: No results for input(s): TSH, T4TOTAL, FREET4, T3FREE, THYROIDAB in the last 72 hours. Anemia Panel: No results for input(s): VITAMINB12, FOLATE, FERRITIN, TIBC, IRON, RETICCTPCT in the last 72 hours. Sepsis Labs: No results for input(s): PROCALCITON, LATICACIDVEN in the last 168 hours.  Recent Results (from the past 240 hour(s))  Resp Panel by RT-PCR (Flu A&B, Covid) Nasopharyngeal Swab     Status: None   Collection Time: 12/21/20  9:28 PM   Specimen: Nasopharyngeal Swab; Nasopharyngeal(NP) swabs in vial transport medium  Result Value Ref Range Status   SARS Coronavirus 2 by RT PCR NEGATIVE NEGATIVE Final    Comment: (NOTE) SARS-CoV-2 target nucleic acids are NOT DETECTED.  The SARS-CoV-2 RNA is generally detectable in upper respiratory specimens during the acute phase of infection. The lowest concentration of SARS-CoV-2 viral copies this assay can detect is 138 copies/mL. A negative result does not preclude SARS-Cov-2 infection and should not be used as the sole basis for treatment or other patient management decisions. A negative result may occur with  improper specimen collection/handling, submission of specimen other than nasopharyngeal swab, presence of viral mutation(s) within the areas targeted by this assay, and inadequate number of viral copies(<138 copies/mL). A negative result must be combined with clinical observations, patient history, and epidemiological information. The expected result is Negative.  Fact Sheet for Patients:  EntrepreneurPulse.com.au  Fact Sheet for Healthcare Providers:  IncredibleEmployment.be  This test is no t yet approved or cleared by the [place] FDA and  has been authorized for detection and/or diagnosis of SARS-CoV-2 by FDA under an Emergency Use Authorization (EUA). This EUA will remain   in effect (meaning this test can be used) for the duration of the COVID-19 declaration under Section 564(b)(1) of the Act, 21 U.S.C.section 360bbb-3(b)(1), unless the authorization is terminated  or revoked sooner.       Influenza A by PCR NEGATIVE NEGATIVE Final   Influenza B by PCR NEGATIVE NEGATIVE Final    Comment: (NOTE) The Xpert Xpress SARS-CoV-2/FLU/RSV plus assay is intended as an aid in the diagnosis of influenza from Nasopharyngeal swab specimens and should not be used as a sole basis for treatment. Nasal washings and aspirates are unacceptable for Xpert Xpress SARS-CoV-2/FLU/RSV testing.  Fact Sheet for Patients: EntrepreneurPulse.com.au  Fact Sheet for Healthcare Providers: IncredibleEmployment.be  This test is not yet approved or cleared by the [place] and  has been authorized for detection and/or diagnosis of SARS-CoV-2 by FDA under an Emergency Use Authorization (EUA). This EUA will remain in effect (meaning this test can be used) for the duration of the COVID-19 declaration under Section 564(b)(1) of the Act, 21 U.S.C. section 360bbb-3(b)(1), unless the authorization is terminated or revoked.  Performed at [facility], [place] [address] [place], [place] [address]          Radiology Studies: No results found.      Scheduled Meds: . sodium chloride   Intravenous Once  . bictegravir-emtricitabine-tenofovir AF  1 tablet Oral Daily  . Chlorhexidine Gluconate Cloth  6 each Topical Q0600  . insulin aspart  0-6 Units Subcutaneous Q4H  . latanoprost  1 drop Both Eyes QHS  . [START ON 12/26/2020] pantoprazole  40 mg Oral BID  . thiamine injection  100 mg Intravenous Daily  . timolol  1 drop Both Eyes Daily  . umeclidinium-vilanterol  1 puff Inhalation Daily   Continuous Infusions: . sodium chloride    . sodium chloride    . pantoprozole (PROTONIX) infusion 8 mg/hr (12/23/20 0112)     LOS: 1 day     Time spent: 38 minutes spent on chart review, discussion with nursing staff, consultants, updating family and interview/physical exam; more than 50% of that time was spent in counseling and/or coordination of care.    Camdyn Beske J [place] DO [facility] Available via Epic secure chat 7am-7pm After these hours, please refer to coverage provider listed on [email or web address] 12/23/2020, 11:11 AM

## 2020-12-23 NOTE — Telephone Encounter (Signed)
Patient called office and was concerned about not being able to eat. Patient is aware that he will need to discuss this with the medical staff and the hospital that is handling his care about this.Patient also upset about not being able to go home.  Message routed to Sherrie Mustache, NP

## 2020-12-23 NOTE — Plan of Care (Signed)
  Problem: Education: Goal: Knowledge of General Education information will improve Description: Including pain rating scale, medication(s)/side effects and non-pharmacologic comfort measures Outcome: Progressing   Problem: Fluid Volume: Goal: Will show no signs and symptoms of excessive bleeding Outcome: Progressing   Problem: Clinical Measurements: Goal: Complications related to the disease process, condition or treatment will be avoided or minimized Outcome: Progressing

## 2020-12-23 NOTE — Progress Notes (Signed)
Subjective:  Had HD overnight-  No volume removal, signed off early -  hgb 7.2 this AM-  Looks like total of 4 units PRB given - says no more bloody BMs-  Wants to go home- may leave AMA   Objective Vital signs in last 24 hours: Vitals:   12/22/20 2137 12/23/20 0421 12/23/20 0451 12/23/20 0733  BP: (!) 121/57 (!) 107/48 (!) 113/58 (!) 119/53  Pulse: 100 78 87 87  Resp: 18 18  16   Temp: 98 F (36.7 C) 98 F (36.7 C) 98.6 F (37 C) 98.5 F (36.9 C)  TempSrc:   Axillary   SpO2:  100% 100% 100%  Weight:      Height:       Weight change:   Intake/Output Summary (Last 24 hours) at 12/23/2020 0900 Last data filed at 12/22/2020 2100 Gross per 24 hour  Intake 790 ml  Output 0 ml  Net 790 ml    Dialysis Orders:  MWF - [facility]  4hrs, BFR 400, DFR 500,  EDW 76kg, 2K/ 2Ca Access: L AVF Mircera 153mcg q2wks - last 12/09/20 Calcitriol 3.52mcg PO qHD-last 12/21/20 Renvela 800mg  3 tablets with meals  Last Labs: Hgb 6.1 (2 units PRBCs ordered in ER), TSAT 13 (12/03/20 OP HD), K 4.2, Ca 9.0, P 3.9 (12/03/20 OP HD), PTH 712 (12/03/20 OP HD)  Assessment/Plan: 1.  GIB: Hgb 6.1   Nodule in duodenal bulb actively bleeding -  Treated with clips and hemostatic spray with significant slowing.  Looks like total of 4 units of blood given-  Also on protonix drip-  Does not seem stable at this time for discharge 2.  ESRD - On HD Mon/Wed/Fri. Only received 1 hour tx on 12/21/20 in outpatient HD center- got another 2 hours maybe last night prior to signoff.  My plan would be to do HD tomorrow again off schedule due to uremia-  He is saying that he wont do that- may sign out AMA.  Will write orders 3.  Hypertension/volume  -  Patient received PRBCs. No evidence of fluid volume overload on exam. BP controlled- no meds 4.  Anemia of CKD - Hgb 6.1; patient received 4 units PRBCs; will order Aranesp -increase dose and give sooner- give 200 today  5.  Secondary Hyperparathyroidism -  Ca 9.0. Patient is  currently NPO Calcitriol and binders on hold for now. Will monitor trend. May initiate Hectorol. 6.  Nutrition - Patient is currently NPO. Advance to renal diet as tolerated when patient is clinically stable.     Louis Meckel    Labs: Basic Metabolic Panel: Recent Labs  Lab 12/21/20 2052 12/22/20 0223 12/23/20 0125  NA 142 140 143  K 4.1 4.2 3.6  CL 103 102 106  CO2 24 23 23   GLUCOSE 85 109* 110*  BUN 121* 135* 107*  CREATININE 7.43* 7.37* 5.75*  CALCIUM 9.1 9.0 8.4*   Liver Function Tests: Recent Labs  Lab 12/21/20 0834  AST 16  ALT 9  ALKPHOS 79  BILITOT 0.8  PROT 6.8  ALBUMIN 2.8*   Recent Labs  Lab 12/21/20 0834  LIPASE 73*   No results for input(s): AMMONIA in the last 168 hours. CBC: Recent Labs  Lab 12/21/20 0834 12/21/20 2052 12/22/20 0223 12/22/20 0932 12/23/20 0125  WBC 7.5 7.1 7.6  --  7.5  NEUTROABS 5.8  --   --   --   --   HGB 6.2* 6.0* 6.5* 6.1* 7.2*  HCT 20.6*  19.3* 20.9* 18.3* 20.6*  MCV 103.0* 99.0 98.1  --  92.0  PLT 168 147* 129*  --  108*   Cardiac Enzymes: No results for input(s): CKTOTAL, CKMB, CKMBINDEX, TROPONINI in the last 168 hours. CBG: Recent Labs  Lab 12/22/20 1820 12/22/20 2136 12/22/20 2348 12/23/20 0423 12/23/20 0729  GLUCAP 107* 114* 104* 110* 97    Iron Studies: No results for input(s): IRON, TIBC, TRANSFERRIN, FERRITIN in the last 72 hours. Studies/Results: DG Abdomen Acute W/Chest  Result Date: 12/21/2020 CLINICAL DATA:  Abdominal pain, vomiting EXAM: DG ABDOMEN ACUTE WITH 1 VIEW CHEST COMPARISON:  07/23/2019 FINDINGS: Prior CABG. Cardiomegaly. Aortic atherosclerosis. Areas of scarring in the upper lobes bilaterally. No acute confluent opacities or effusions. Prior cholecystectomy. Nonobstructive bowel gas pattern. No free air or organomegaly. Vascular calcifications noted. No visible aneurysm. IMPRESSION: No bowel obstruction or free air. Cardiomegaly.  No acute cardiopulmonary disease.  Electronically Signed   By: Rolm Baptise M.D.   On: 12/21/2020 09:41   Medications: Infusions: . sodium chloride    . sodium chloride    . pantoprozole (PROTONIX) infusion 8 mg/hr (12/23/20 0112)    Scheduled Medications: . sodium chloride   Intravenous Once  . Chlorhexidine Gluconate Cloth  6 each Topical Q0600  . insulin aspart  0-6 Units Subcutaneous Q4H  . latanoprost  1 drop Both Eyes QHS  . [START ON 12/26/2020] pantoprazole  40 mg Intravenous Q12H  . pantoprazole (PROTONIX) IV  40 mg Intravenous Once  . thiamine injection  100 mg Intravenous Daily  . timolol  1 drop Both Eyes Daily  . umeclidinium-vilanterol  1 puff Inhalation Daily    have reviewed scheduled and prn medications.  Physical Exam: General: agitated, NAD Heart: RRR Lungs: mostly clear Abdomen: tender Extremities: min edema Dialysis Access: L  AVF patent    12/23/2020,9:00 AM  LOS: 1 day

## 2020-12-23 NOTE — Progress Notes (Addendum)
Daily Rounding Note  12/23/2020, 9:38 AM  LOS: 1 day   SUBJECTIVE:   Chief complaint:   Upper GI bleed, melena.  Status post treatment of gastric AVMs.  Patient feels well other than feeling very hungry as he has been n.p.o. restricted only to ice chips.  Never has had nausea vomiting.  Not dizzy.  No abdominal pain.  No stools.  OBJECTIVE:         Vital signs in last 24 hours:    Temp:  [97.3 F (36.3 C)-98.6 F (37 C)] 98.5 F (36.9 C) (03/02 0733) Pulse Rate:  [78-100] 87 (03/02 0733) Resp:  [13-31] 16 (03/02 0733) BP: (87-150)/(29-63) 119/53 (03/02 0733) SpO2:  [98 %-100 %] 100 % (03/02 0913) Weight:  [80.3 kg-81.8 kg] 81.8 kg (03/01 2100)   Filed Weights   12/22/20 1328 12/22/20 1900 12/22/20 2100  Weight: 80.3 kg 81.8 kg 81.8 kg   General: Looks a bit tired but not ill. Heart: RRR. Chest: Diminished but clear breath sounds.  No labored breathing.  No cough Abdomen: Soft, not tender, not distended.  Active bowel sounds Extremities: No CCE. Neuro/Psych: Alert.  Oriented x3.  Moves all 4 limbs.  No tremor.  Intake/Output from previous day: 03/01 0701 - 03/02 0700 In: 790 [Blood:790] Out: 0   Intake/Output this shift: No intake/output data recorded.  Lab Results: Recent Labs    12/21/20 2052 12/22/20 0223 12/22/20 0932 12/23/20 0125  WBC 7.1 7.6  --  7.5  HGB 6.0* 6.5* 6.1* 7.2*  HCT 19.3* 20.9* 18.3* 20.6*  PLT 147* 129*  --  108*   BMET Recent Labs    12/21/20 2052 12/22/20 0223 12/23/20 0125  NA 142 140 143  K 4.1 4.2 3.6  CL 103 102 106  CO2 24 23 23   GLUCOSE 85 109* 110*  BUN 121* 135* 107*  CREATININE 7.43* 7.37* 5.75*  CALCIUM 9.1 9.0 8.4*   LFT Recent Labs    12/21/20 0834  PROT 6.8  ALBUMIN 2.8*  AST 16  ALT 9  ALKPHOS 79  BILITOT 0.8    Studies/Results: No results found. Scheduled Meds: . sodium chloride   Intravenous Once  . Chlorhexidine Gluconate Cloth  6  each Topical Q0600  . darbepoetin (ARANESP) injection - DIALYSIS  200 mcg Intravenous Once  . insulin aspart  0-6 Units Subcutaneous Q4H  . latanoprost  1 drop Both Eyes QHS  . [START ON 12/26/2020] pantoprazole  40 mg Intravenous Q12H  . pantoprazole (PROTONIX) IV  40 mg Intravenous Once  . thiamine injection  100 mg Intravenous Daily  . timolol  1 drop Both Eyes Daily  . umeclidinium-vilanterol  1 puff Inhalation Daily   Continuous Infusions: . sodium chloride    . sodium chloride    . pantoprozole (PROTONIX) infusion 8 mg/hr (12/23/20 0112)   PRN Meds:.sodium chloride, sodium chloride, alteplase, heparin, lidocaine (PF), lidocaine-prilocaine, pentafluoroprop-tetrafluoroeth ASSESMENT:   *   Melena, upper GI bleed in setting of Brilinta.  History of previous bleeding from gastroduodenal AVMs. 12/22/2020 EGD: Duodenal nodule with nonpulsatile bleeding at the base close to pylorus, question AVM.  Area treated with APC and endoclips, hemospray.  Bleeding resolved by the end of the case.  *   ABL anemia, on top of anemia of chronic disease. Hgb 6 >> 7.2.  Status post 4 PRBCs.  On Aranesp.  *   Chronic Brilinta, on hold.  Cardiac stent 11/2019  *  ESRD.  On hemodialysis  *   Thrombocytopenia  *    Chronic mild elevation of lipase.   PLAN   *   Ordered carb modified, renal diet.  *    Recheck CBC in the morning, Hgb post transfusion today.   Azucena Freed  12/23/2020, 9:38 AM Phone [phone]    [facility] Attending   I have taken an interval history, reviewed the chart and examined the patient. I agree with the Advanced Practitioner's note, impression and recommendations.   I believe he has stopped bleeding.  I am going to transition him to 40 mg pantoprazole daily which she can stay on at discharge  If he is okay tomorrow he can go home  He does not need a GI follow-up as an outpatient his primary care and nephrology doctors can follow-up his hemoglobin.  Should there  be more signs of bleeding we could see him back.  He should stay off his Brilinta for 5 more days  Gatha Mayer, MD, [facility] 12/23/2020 6:05 PM

## 2020-12-24 ENCOUNTER — Encounter (HOSPITAL_COMMUNITY): Payer: Self-pay | Admitting: Internal Medicine

## 2020-12-24 DIAGNOSIS — K922 Gastrointestinal hemorrhage, unspecified: Secondary | ICD-10-CM | POA: Diagnosis not present

## 2020-12-24 LAB — CBC
HCT: 20.6 % — ABNORMAL LOW (ref 39.0–52.0)
Hemoglobin: 7.1 g/dL — ABNORMAL LOW (ref 13.0–17.0)
MCH: 32.1 pg (ref 26.0–34.0)
MCHC: 34.5 g/dL (ref 30.0–36.0)
MCV: 93.2 fL (ref 80.0–100.0)
Platelets: 104 10*3/uL — ABNORMAL LOW (ref 150–400)
RBC: 2.21 MIL/uL — ABNORMAL LOW (ref 4.22–5.81)
RDW: 17.8 % — ABNORMAL HIGH (ref 11.5–15.5)
WBC: 6.5 10*3/uL (ref 4.0–10.5)
nRBC: 0 % (ref 0.0–0.2)

## 2020-12-24 LAB — BASIC METABOLIC PANEL
Anion gap: 11 (ref 5–15)
BUN: 113 mg/dL — ABNORMAL HIGH (ref 8–23)
CO2: 23 mmol/L (ref 22–32)
Calcium: 8.4 mg/dL — ABNORMAL LOW (ref 8.9–10.3)
Chloride: 104 mmol/L (ref 98–111)
Creatinine, Ser: 6.7 mg/dL — ABNORMAL HIGH (ref 0.61–1.24)
GFR, Estimated: 8 mL/min — ABNORMAL LOW (ref >60)
Glucose, Bld: 109 mg/dL — ABNORMAL HIGH (ref 70–99)
Potassium: 3.9 mmol/L (ref 3.5–5.1)
Sodium: 138 mmol/L (ref 135–145)

## 2020-12-24 LAB — PREPARE RBC (CROSSMATCH)

## 2020-12-24 LAB — GLUCOSE, CAPILLARY
Glucose-Capillary: 107 mg/dL — ABNORMAL HIGH (ref 70–99)
Glucose-Capillary: 118 mg/dL — ABNORMAL HIGH (ref 70–99)
Glucose-Capillary: 179 mg/dL — ABNORMAL HIGH (ref 70–99)

## 2020-12-24 MED ORDER — THIAMINE HCL 100 MG PO TABS
100.0000 mg | ORAL_TABLET | Freq: Every day | ORAL | Status: DC
  Administered 2020-12-24: 100 mg via ORAL
  Filled 2020-12-24: qty 1

## 2020-12-24 MED ORDER — SODIUM CHLORIDE 0.9% IV SOLUTION: Freq: Once | INTRAVENOUS | Status: DC

## 2020-12-24 MED ORDER — TICAGRELOR 90 MG PO TABS: 90.0000 mg | ORAL_TABLET | Freq: Two times a day (BID) | ORAL | Status: DC

## 2020-12-24 MED ORDER — CALCITRIOL 0.5 MCG PO CAPS: 3.5000 ug | ORAL_CAPSULE | ORAL | Status: DC

## 2020-12-24 NOTE — Discharge Summary (Signed)
Physician Discharge Summary  MATTHIAS BOGUS JIR:[id number] DOB: 11-17-1950 DOA: 12/21/2020  PCP: [name], NP  Admit date: 12/21/2020 Discharge date: 12/24/2020  Admitted From: Home  Disposition: Home   Recommendations for Outpatient Follow-up:  1. Follow up with PCP in 1-2 weeks 2. Follow-up with cardiology as scheduled 3. Hold Brilinta for 5 days after discharge per GI recommendation; encouraged to discuss with cardiology whether he can can discontinue this given his recurrent GI bleeds now that has been greater than 1 year since his stent placement. 4. Please obtain CBC in one week  Home Health: No Equipment/Devices: None  Discharge Condition: Stable CODE STATUS: Full code Diet recommendation: Renal diet  History of present illness:  Jacob [name] is a 70 y.o.malewithhistory of recurrent GI bleed 2/2 AVMs, CAD on Brilinta, HIV, ESRD on HD MWF, essential hypertension, COPD, type 2 diabetes mellitus, Hx Covid-19 viral infection who presented to the ED with frank rectal bleeding. Patient was seen in the ED earlier that day in which his hemoglobin was found to be six he was transfused 1 unit PRBC and he left AGAINST MEDICAL ADVICE. Patient then returned to the ED after multiple episodes of blood in the stool. Patient denies any abdominal pain, no nausea/vomiting, no chest pain, no shortness of breath.  Patient admitted December 2021 with EGD showing gastric AVMs which were cauterized. Bleeding complicated by use of Brilinta.  In the ER, 7.7 F, HR 100, RR 23, BP 90/46, SPO2 100% on room air. 141, potassium 3.8, chloride 101, CO2 26, glucose 130, BUN 81, creatinine 5.47. Lipase 73, AST 16, ALT nine, total bilirubin 0.8. WBC 7.5, hemoglobin 6.2, MCV 113.0, platelets 168. FOBT positive. Acute abdominal series with no obstruction or free air, cardiomegaly and no acute cardiopulmonary disease process. Covid-19/influenza A/B PCR negative. Patient was transfused 1 unit PRBC.  Gastroenterology was consulted. Hospital service consulted for further evaluation and management of acute GI bleed likely secondary to gastric AVMs.  Hospital course:  GI bleed likely secondary to AVMs, recurrent Patient representing to the ED with bloody stools, low been 6.2. History of AVMs requiring cauterization in December 2021. This is complicated by use of Brilinta. EGD 3/1 with findings of blood in the duodenal bulb nodule and duodenum with steady nonpulsatile bleeding status post APC and clipping x4.  Patient was transfused total 5 units PRBCs during hospitalization.  Continue Protonix upon discharge.  Continue to hold Brilinta for 5 days per gastroenterology.  Discussed with patient need to discuss with cardiology if this medication can be discontinued given his recurrent GI bleeds, and stent placement now greater than 1 year out.  Recommend CBC in 1 week.  Essential hypertension CAD s/p DES Patient underwent heart catheterization 12/19/2019 with DES placements. Continues on Brilinta outpatient. Hypotensive on arrival likely secondary to GI bleed.  Patient transfuse as above.  May resume home carvedilol and isosorbide mononitrate.  Holding Brilinta for 5 days per GI recommendations.  Discussion with cardiology outpatient as above.  COPD Continue Anoro Ellipta 1 puff daily  ESRD on HD Dialyzes on a Monday/Wednesday/Friday schedule. Partial HD session on 12/21/20. Potassium within normal limits, BUN 751; complicated by GI bleed as above.  Nephrology followed while inpatient and patient continued on HD during his hospitalization.  Type 2 diabetes mellitus Hemoglobin A1c 4.6 on 12/15/2020, well controlled. Home regimen includes Humalog 20 units TID prn for glucose above 150.  HIV CD4 count 36 06/30/2020. Follows with ID, Dr. [name] outpatient.  Continue [employer]  History of Covid-19 viral infection Patient tested positive for Covid-19 on 11/24/2020, was asymptomatic. Repeat Covid-19  PCR on admission negative. Oxygenating well on room air. Is fully vaccinated against Covid-19 with [employer] vaccination.   Discharge Diagnoses:  Active Problems:   Human immunodeficiency virus (HIV) disease ([place])   Essential hypertension   Coronary artery disease involving native heart without angina pectoris   Type 2 diabetes mellitus with diabetic polyneuropathy, with long-term current use of insulin (HCC)   Gastric AVM   ESRD on hemodialysis [facility])    Discharge Instructions  Discharge Instructions    Call MD for:  difficulty breathing, headache or visual disturbances   Complete by: As directed    Call MD for:  extreme fatigue   Complete by: As directed    Call MD for:  persistant dizziness or light-headedness   Complete by: As directed    Call MD for:  persistant nausea and vomiting   Complete by: As directed    Call MD for:  severe uncontrolled pain   Complete by: As directed    Call MD for:  temperature >100.4   Complete by: As directed    Diet - low sodium heart healthy   Complete by: As directed    Increase activity slowly   Complete by: As directed    No wound care   Complete by: As directed      Allergies as of 12/24/2020      Reactions   Augmentin [amoxicillin-pot Clavulanate] Diarrhea   Severe diarrhea   Mucinex Fast-max Other (See Comments)   Intensive sweating    Amphetamines Other (See Comments)   Unknown reaction      Medication List    TAKE these medications   acetaminophen 650 MG CR tablet Commonly known as: TYLENOL Take 650 mg by mouth 3 (three) times daily.   allopurinol 100 MG tablet Commonly known as: ZYLOPRIM TAKE 1 TABLET(100 MG) BY MOUTH DAILY What changed:   how much to take  how to take this  when to take this  additional instructions   Anoro Ellipta 62.5-25 MCG/INH Aepb Generic drug: umeclidinium-vilanterol INHALE 1 PUFF BY MOUTH EVERY DAY What changed: See the new instructions.   B-D UF III MINI PEN NEEDLES 31G X 5 MM  Misc Generic drug: Insulin Pen Needle USE FOUR TIMES DAILY   Biktarvy 50-200-25 MG Tabs tablet Generic drug: bictegravir-emtricitabine-tenofovir AF Take 1 tablet by mouth daily.   carvedilol 3.125 MG tablet Commonly known as: COREG Take 1 tablet (3.125 mg total) by mouth 2 (two) times daily.   colchicine 0.6 MG tablet Take 0.6-1.2 mg by mouth daily as needed (gout flare-up).   diclofenac Sodium 1 % Gel Commonly known as: VOLTAREN APPLY 2 GRAMS TO THE AFFECTED AREA THREE TIMES DAILY AS NEEDED FOR PAIN What changed: See the new instructions.   folic acid 1 MG tablet Commonly known as: FOLVITE Take 1 tablet (1 mg total) by mouth daily.   gabapentin 300 MG capsule Commonly known as: NEURONTIN Take 1 capsule (300 mg total) by mouth daily. Dose change due to renal function   insulin lispro 100 UNIT/ML KwikPen Commonly known as: HumaLOG KwikPen Inject 0.48ml (20 Units total) into the skin 3 (three) times daily as needed for high blood sugar (above 150)   iron sucrose in sodium chloride 0.9 % 100 mL Iron Sucrose (Venofer)   isosorbide mononitrate 30 MG 24 hr tablet Commonly known as: IMDUR TAKE 1 TABLET BY MOUTH EVERY DAY What changed:  how much to take  how to take this  when to take this  additional instructions   latanoprost 0.005 % ophthalmic solution Commonly known as: XALATAN Place 1 drop into both eyes at bedtime.   MIRCERA IJ Mircera   multivitamin Tabs tablet Take 1 tablet by mouth daily.   nitroGLYCERIN 0.3 MG SL tablet Commonly known as: NITROSTAT ONE TABLET UNDER TONGUE AS NEEDED FOR CHEST PAIN What changed: See the new instructions.   OneTouch Delica Plus [id number] Misc Inject 1 Device as directed 3 (three) times daily. Dx: E11.40   OneTouch Ultra test strip Generic drug: glucose blood CHECK BLOOD SUGAR 3 TIMES  DAILY What changed: See the new instructions.   pantoprazole 40 MG tablet Commonly known as: PROTONIX TAKE 1 TABLET(40 MG) BY  MOUTH TWICE DAILY What changed: See the new instructions.   polyethylene glycol 17 g packet Commonly known as: MIRALAX / GLYCOLAX Take 17 g by mouth daily as needed for mild constipation.   SENSIPAR PO Take 1 tablet by mouth 3 (three) times a week. At dialysis   sevelamer carbonate 800 MG tablet Commonly known as: RENVELA Take 800 mg by mouth 3 (three) times daily with meals.   ticagrelor 90 MG Tabs tablet Commonly known as: BRILINTA Take 1 tablet (90 mg total) by mouth 2 (two) times daily. Start taking on: December 29, 2020 What changed: These instructions start on December 29, 2020. If you are unsure what to do until then, ask your doctor or other care provider.   timolol 0.5 % ophthalmic solution Commonly known as: TIMOPTIC Place 1 drop into both eyes daily.   traMADol 50 MG tablet Commonly known as: ULTRAM Take 1 tablet (50 mg total) by mouth 2 (two) times daily as needed.   VITAMIN D BOOSTER PO Take 1 each by mouth 2 (two) times daily.       Follow-up Information    [name], NP. Schedule an appointment as soon as possible for a visit in 1 week(s).   Specialty: Geriatric Medicine Contact information: [address]. [place] [address] [phone]        [name], MD .   Specialty: Infectious Diseases Contact information: [place]. [place] [address] [phone]        [name], MD. Go to.   Specialty: Cardiology Contact information: [address] [place] [address] [phone]              Allergies  Allergen Reactions  . Augmentin [Amoxicillin-Pot Clavulanate] Diarrhea    Severe diarrhea  . Mucinex Fast-Max Other (See Comments)    Intensive sweating   . Amphetamines Other (See Comments)    Unknown reaction    Consultations:  [facility]  Nephrology   Procedures/Studies: DG Chest Portable 1 View  Result Date: 12/11/2020 CLINICAL DATA:  Shortness of breath EXAM:  PORTABLE CHEST 1 VIEW COMPARISON:  11/24/2020 FINDINGS: Cardiomegaly status post median sternotomy and CABG. Both lungs are clear. The visualized skeletal structures are unremarkable. IMPRESSION: Cardiomegaly without acute abnormality of the lungs in AP portable projection. Electronically Signed   By: Eddie Candle M.D.   On: 12/11/2020 13:05   DG CHEST PORT 1 VIEW  Result Date: 11/24/2020 CLINICAL DATA:  Weakness, COVID-19 positivity EXAM: PORTABLE CHEST 1 VIEW COMPARISON:  11/05/2020 FINDINGS: Cardiac shadow is enlarged but stable. Postsurgical changes are again seen. Aortic calcifications are noted. Lungs are well aerated bilaterally. Mild patchy airspace opacities are  noted consistent with multifocal pneumonia. No bony abnormality is noted. IMPRESSION: Patchy airspace opacity consistent with the known history of COVID-19 positivity. Electronically Signed   By: Inez Catalina M.D.   On: 11/24/2020 20:24   DG Abdomen Acute W/Chest  Result Date: 12/21/2020 CLINICAL DATA:  Abdominal pain, vomiting EXAM: DG ABDOMEN ACUTE WITH 1 VIEW CHEST COMPARISON:  07/23/2019 FINDINGS: Prior CABG. Cardiomegaly. Aortic atherosclerosis. Areas of scarring in the upper lobes bilaterally. No acute confluent opacities or effusions. Prior cholecystectomy. Nonobstructive bowel gas pattern. No free air or organomegaly. Vascular calcifications noted. No visible aneurysm. IMPRESSION: No bowel obstruction or free air. Cardiomegaly.  No acute cardiopulmonary disease. Electronically Signed   By: Rolm Baptise M.D.   On: 12/21/2020 09:41   DG Foot Complete Right  Result Date: 12/10/2020 Please see detailed radiograph report in office note.    Subjective: Seen and examined bedside, resting comfortably.  Plan to transfuse 1 additional unit with HD today and then discharge home.  No other complaints or concerns at this time.  Discussed with patient regarding holding Brilinta for 5 days per GI and to discuss need for continued use with  cardiology with upcoming visit.  Denies headache, no visual changes, no chest pain, no palpitations, no shortness of breath, no abdominal pain.  No acute events overnight per nursing staff.  Discharge Exam: Vitals:   12/24/20 1530 12/24/20 1555  BP: 113/62   Pulse: 78 82  Resp:    Temp:  98.6 F (37 C)  SpO2:  99%   Vitals:   12/24/20 1430 12/24/20 1500 12/24/20 1530 12/24/20 1555  BP: 133/60 (!) 111/40 113/62   Pulse: 83 79 78 82  Resp:      Temp:    98.6 F (37 C)  TempSrc:    Oral  SpO2:    99%  Weight:      Height:        General: Pt is alert, awake, not in acute distress Cardiovascular: RRR, S1/S2 +, no rubs, no gallops Respiratory: CTA bilaterally, no wheezing, no rhonchi Abdominal: Soft, NT, ND, bowel sounds + Extremities: no edema, no cyanosis    The results of significant diagnostics from this hospitalization (including imaging, microbiology, ancillary and laboratory) are listed below for reference.     Microbiology: Recent Results (from the past 240 hour(s))  Resp Panel by RT-PCR (Flu A&B, Covid) Nasopharyngeal Swab     Status: None   Collection Time: 12/21/20  9:28 PM   Specimen: Nasopharyngeal Swab; Nasopharyngeal(NP) swabs in vial transport medium  Result Value Ref Range Status   SARS Coronavirus 2 by RT PCR NEGATIVE NEGATIVE Final    Comment: (NOTE) SARS-CoV-2 target nucleic acids are NOT DETECTED.  The SARS-CoV-2 RNA is generally detectable in upper respiratory specimens during the acute phase of infection. The lowest concentration of SARS-CoV-2 viral copies this assay can detect is 138 copies/mL. A negative result does not preclude SARS-Cov-2 infection and should not be used as the sole basis for treatment or other patient management decisions. A negative result may occur with  improper specimen collection/handling, submission of specimen other than nasopharyngeal swab, presence of viral mutation(s) within the areas targeted by this assay, and  inadequate number of viral copies(<138 copies/mL). A negative result must be combined with clinical observations, patient history, and epidemiological information. The expected result is Negative.  Fact Sheet for Patients:  EntrepreneurPulse.com.au  Fact Sheet for Healthcare Providers:  IncredibleEmployment.be  This test is no t yet approved or cleared by  the [employer] and  has been authorized for detection and/or diagnosis of SARS-CoV-2 by FDA under an Emergency Use Authorization (EUA). This EUA will remain  in effect (meaning this test can be used) for the duration of the COVID-19 declaration under Section 564(b)(1) of the Act, 21 U.S.C.section 360bbb-3(b)(1), unless the authorization is terminated  or revoked sooner.       Influenza A by PCR NEGATIVE NEGATIVE Final   Influenza B by PCR NEGATIVE NEGATIVE Final    Comment: (NOTE) The Xpert Xpress SARS-CoV-2/FLU/RSV plus assay is intended as an aid in the diagnosis of influenza from Nasopharyngeal swab specimens and should not be used as a sole basis for treatment. Nasal washings and aspirates are unacceptable for Xpert Xpress SARS-CoV-2/FLU/RSV testing.  Fact Sheet for Patients: EntrepreneurPulse.com.au  Fact Sheet for Healthcare Providers: IncredibleEmployment.be  This test is not yet approved or cleared by the [place] FDA and has been authorized for detection and/or diagnosis of SARS-CoV-2 by FDA under an Emergency Use Authorization (EUA). This EUA will remain in effect (meaning this test can be used) for the duration of the COVID-19 declaration under Section 564(b)(1) of the Act, 21 U.S.C. section 360bbb-3(b)(1), unless the authorization is terminated or revoked.  Performed at [facility], [place] [address] [place], [place] [address]      Labs: BNP (last 3 results) No results for input(s): BNP in the last 8760 hours. Basic  Metabolic Panel: Recent Labs  Lab 12/21/20 0834 12/21/20 2052 12/22/20 0223 12/23/20 0125 12/24/20 0240  NA 141 142 140 143 138  K 3.8 4.1 4.2 3.6 3.9  CL 101 103 102 106 104  CO2 26 24 23 23 23   GLUCOSE 130* 85 109* 110* 109*  BUN 81* 121* 135* 107* 113*  CREATININE 5.47* 7.43* 7.37* 5.75* 6.70*  CALCIUM 8.7* 9.1 9.0 8.4* 8.4*  MG  --   --   --  1.9  --    Liver Function Tests: Recent Labs  Lab 12/21/20 0834  AST 16  ALT 9  ALKPHOS 79  BILITOT 0.8  PROT 6.8  ALBUMIN 2.8*   Recent Labs  Lab 12/21/20 0834  LIPASE 73*   No results for input(s): AMMONIA in the last 168 hours. CBC: Recent Labs  Lab 12/21/20 0834 12/21/20 2052 12/22/20 0223 12/22/20 0932 12/23/20 0125 12/23/20 0859 12/24/20 0240  WBC 7.5 7.1 7.6  --  7.5  --  6.5  NEUTROABS 5.8  --   --   --   --   --   --   HGB 6.2* 6.0* 6.5* 6.1* 7.2* 7.9* 7.1*  HCT 20.6* 19.3* 20.9* 18.3* 20.6* 23.0* 20.6*  MCV 103.0* 99.0 98.1  --  92.0  --  93.2  PLT 168 147* 129*  --  108*  --  104*   Cardiac Enzymes: No results for input(s): CKTOTAL, CKMB, CKMBINDEX, TROPONINI in the last 168 hours. BNP: Invalid input(s): POCBNP CBG: Recent Labs  Lab 12/23/20 1946 12/23/20 2330 12/24/20 0325 12/24/20 0715 12/24/20 1124  GLUCAP 185* 88 107* 118* 179*   D-Dimer No results for input(s): DDIMER in the last 72 hours. Hgb A1c No results for input(s): HGBA1C in the last 72 hours. Lipid Profile No results for input(s): CHOL, HDL, LDLCALC, TRIG, CHOLHDL, LDLDIRECT in the last 72 hours. Thyroid function studies No results for input(s): TSH, T4TOTAL, T3FREE, THYROIDAB in the last 72 hours.  Invalid input(s): FREET3 Anemia work up No results for input(s): VITAMINB12, FOLATE, FERRITIN, TIBC, IRON,  RETICCTPCT in the last 72 hours. Urinalysis    Component Value Date/Time   COLORURINE YELLOW 10/16/2020 2136   APPEARANCEUR CLEAR 10/16/2020 2136   LABSPEC 1.016 10/16/2020 2136   PHURINE 5.0 10/16/2020 2136    GLUCOSEU NEGATIVE 10/16/2020 2136   GLUCOSEU NEG mg/dL 09/20/2010 2058   HGBUR SMALL (A) 10/16/2020 2136   HGBUR negative 05/31/2010 0948   BILIRUBINUR NEGATIVE 10/16/2020 2136   [place] NEGATIVE 10/16/2020 2136   PROTEINUR 30 (A) 10/16/2020 2136   UROBILINOGEN 0.2 06/24/2015 2336   NITRITE NEGATIVE 10/16/2020 2136   LEUKOCYTESUR NEGATIVE 10/16/2020 2136   Sepsis Labs Invalid input(s): PROCALCITONIN,  WBC,  LACTICIDVEN Microbiology Recent Results (from the past 240 hour(s))  Resp Panel by RT-PCR (Flu A&B, Covid) Nasopharyngeal Swab     Status: None   Collection Time: 12/21/20  9:28 PM   Specimen: Nasopharyngeal Swab; Nasopharyngeal(NP) swabs in vial transport medium  Result Value Ref Range Status   SARS Coronavirus 2 by RT PCR NEGATIVE NEGATIVE Final    Comment: (NOTE) SARS-CoV-2 target nucleic acids are NOT DETECTED.  The SARS-CoV-2 RNA is generally detectable in upper respiratory specimens during the acute phase of infection. The lowest concentration of SARS-CoV-2 viral copies this assay can detect is 138 copies/mL. A negative result does not preclude SARS-Cov-2 infection and should not be used as the sole basis for treatment or other patient management decisions. A negative result may occur with  improper specimen collection/handling, submission of specimen other than nasopharyngeal swab, presence of viral mutation(s) within the areas targeted by this assay, and inadequate number of viral copies(<138 copies/mL). A negative result must be combined with clinical observations, patient history, and epidemiological information. The expected result is Negative.  Fact Sheet for Patients:  EntrepreneurPulse.com.au  Fact Sheet for Healthcare Providers:  IncredibleEmployment.be  This test is no t yet approved or cleared by the [place] FDA and  has been authorized for detection and/or diagnosis of SARS-CoV-2 by FDA under an Emergency Use  Authorization (EUA). This EUA will remain  in effect (meaning this test can be used) for the duration of the COVID-19 declaration under Section 564(b)(1) of the Act, 21 U.S.C.section 360bbb-3(b)(1), unless the authorization is terminated  or revoked sooner.       Influenza A by PCR NEGATIVE NEGATIVE Final   Influenza B by PCR NEGATIVE NEGATIVE Final    Comment: (NOTE) The Xpert Xpress SARS-CoV-2/FLU/RSV plus assay is intended as an aid in the diagnosis of influenza from Nasopharyngeal swab specimens and should not be used as a sole basis for treatment. Nasal washings and aspirates are unacceptable for Xpert Xpress SARS-CoV-2/FLU/RSV testing.  Fact Sheet for Patients: EntrepreneurPulse.com.au  Fact Sheet for Healthcare Providers: IncredibleEmployment.be  This test is not yet approved or cleared by the [place] FDA and has been authorized for detection and/or diagnosis of SARS-CoV-2 by FDA under an Emergency Use Authorization (EUA). This EUA will remain in effect (meaning this test can be used) for the duration of the COVID-19 declaration under Section 564(b)(1) of the Act, 21 U.S.C. section 360bbb-3(b)(1), unless the authorization is terminated or revoked.  Performed at [facility], [place] [address], [place], [place] [address]      Time coordinating discharge: Over 30 minutes  SIGNED:   Eric J [place] DO  [facility] 12/24/2020, 4:29 PM

## 2020-12-24 NOTE — Progress Notes (Signed)
Daily Rounding Note  12/24/2020, 11:09 AM  LOS: 2 days   SUBJECTIVE:   Chief complaint: UGI bleed, melena.  Gastric AVMs treated.  Frustrated by delay in getting to HD  Small stools yest, blood tinged.  None overnight or this AM.   Feels well.  OBJECTIVE:         Vital signs in last 24 hours:    Temp:  [98.1 F (36.7 C)-98.7 F (37.1 C)] 98.7 F (37.1 C) (03/03 0815) Pulse Rate:  [85-87] 87 (03/03 0323) Resp:  [13-20] 13 (03/03 0815) BP: (115-133)/(54-67) 116/54 (03/03 0815) SpO2:  [100 %] 100 % (03/03 0905) Last BM Date: 12/24/20 Filed Weights   12/22/20 1328 12/22/20 1900 12/22/20 2100  Weight: 80.3 kg 81.8 kg 81.8 kg   General: looks well.  Comfortable.  alert   Heart: RRR Chest: clear bil.  No dyspnea Abdomen: soft, NT, ND.  Active BS  Extremities: No tremors, no weakness. Neuro/Psych: Frustrated, fluid speech. Moves all 4 limbs. Able to stand and walk without assistance.  Intake/Output from previous day: 03/02 0701 - 03/03 0700 In: 2352.5 [I.V.:20; Blood:2332.5] Out: -   Intake/Output this shift: Total I/O In: 240 [P.O.:240] Out: -   Lab Results: Recent Labs    12/22/20 0223 12/22/20 0932 12/23/20 0125 12/23/20 0859 12/24/20 0240  WBC 7.6  --  7.5  --  6.5  HGB 6.5*   < > 7.2* 7.9* 7.1*  HCT 20.9*   < > 20.6* 23.0* 20.6*  PLT 129*  --  108*  --  104*   < > = values in this interval not displayed.   BMET Recent Labs    12/22/20 0223 12/23/20 0125 12/24/20 0240  NA 140 143 138  K 4.2 3.6 3.9  CL 102 106 104  CO2 23 23 23   GLUCOSE 109* 110* 109*  BUN 135* 107* 113*  CREATININE 7.37* 5.75* 6.70*  CALCIUM 9.0 8.4* 8.4*    Scheduled Meds: . sodium chloride   Intravenous Once  . sodium chloride   Intravenous Once  . bictegravir-emtricitabine-tenofovir AF  1 tablet Oral Daily  . [START ON 12/25/2020] calcitRIOL  3.5 mcg Oral Q M,W,F-HD  . Chlorhexidine Gluconate Cloth  6 each  Topical Q0600  . insulin aspart  0-6 Units Subcutaneous Q4H  . latanoprost  1 drop Both Eyes QHS  . pantoprazole  40 mg Oral QAC breakfast  . thiamine  100 mg Oral Daily  . timolol  1 drop Both Eyes Daily  . umeclidinium-vilanterol  1 puff Inhalation Daily   Continuous Infusions: . sodium chloride    . sodium chloride 100 mL (12/23/20 1444)   PRN Meds:.sodium chloride, sodium chloride, acetaminophen, alteplase, heparin, lidocaine (PF), lidocaine-prilocaine, melatonin, pentafluoroprop-tetrafluoroeth   ASSESMENT:   *   Melena, UGIB in setting of Brilinta.  Recurrent bleeding from gastroduodenal AVMs. 12/22/2020 EGD.  Nonpulsatile bleeding at base of duodenal nodule close to pylorus, ?  AVM.  This was treated with APC, hemospray, endoclips.  *    ABL anemia superimposed on AOCD. Hgb 6 >> 7.9 >> 7.1.  On Aranesp, Dr. Moshe Cipro increased dosage and ordered administration sooner than normal schedule..  *   Noncritical thrombocytopenia.  *   ESRD.  Normally on MWF HD schedule.  Dr. Moshe Cipro wants additional HD session today to address uremia though patient refusing.  *   HIV.  On Biktarvy.     PLAN   *   GI  signing off. Call if reinvolvement needed. Follow-up with PCP and renal. Plans for GI follow-up.    Azucena Freed  12/24/2020, 11:09 AM Phone [phone]

## 2020-12-24 NOTE — Progress Notes (Signed)
Subjective:  hgb 7.9 to 7.1- my plan was for HD today due to uremia-  He disagrees "I can just get it tomorrow at [facility] (our other name for [facility])'  Unit of blood ordered for today -  Ate breakfast   Objective Vital signs in last 24 hours: Vitals:   12/23/20 2104 12/24/20 0323 12/24/20 0815 12/24/20 0905  BP: 133/67 (!) 115/57 (!) 116/54   Pulse: 85 87    Resp: 18 20 13    Temp: 98.1 F (36.7 C) 98.6 F (37 C) 98.7 F (37.1 C)   TempSrc: Oral  Oral   SpO2: 100% 100% 100% 100%  Weight:      Height:       Weight change:   Intake/Output Summary (Last 24 hours) at 12/24/2020 0915 Last data filed at 12/24/2020 0754 Gross per 24 hour  Intake 260 ml  Output --  Net 260 ml    Dialysis Orders:  MWF - [facility]  4hrs, BFR 400, DFR 500,  EDW 76kg, 2K/ 2Ca Access: L AVF Mircera 144mcg q2wks - last 12/09/20 Calcitriol 3.78mcg PO qHD-last 12/21/20 Renvela 800mg  3 tablets with meals  Last Labs: Hgb 6.1 (2 units PRBCs ordered in ER), TSAT 13 (12/03/20 OP HD), K 4.2, Ca 9.0, P 3.9 (12/03/20 OP HD), PTH 712 (12/03/20 OP HD)  Assessment/Plan: 1.  GIB: Hgb 6.1 on admit  Nodule in duodenal bulb actively bleeding -  Treated with clips and hemostatic spray with significant slowing.  Looks like total of 4 units of blood given-  Also on protonix drip- hgb 7.9 to 7.1 last 24 hours- to get blood today  2.  ESRD - On HD Mon/Wed/Fri. Only received 1 hour tx on 12/21/20 in outpatient HD center- got another 2 hours maybe Tuesday night prior to signoff.  My plan was to do HD today again off schedule due to uremia-  He is saying that he wont do that- may sign out AMA.  If leaves yes can go to OP unit tomorrow-  If still here will try again to do HD here tomorrow 3.  Hypertension/volume  -  Patient received PRBCs. No evidence of fluid volume overload on exam. BP controlled- no meds 4.  Anemia of CKD - Hgb 6.1; patient received 4 units PRBCs; have ordered Aranesp -increase dose and give sooner-  give 200  5.  Secondary Hyperparathyroidism -  Ca 9.0.  Calcitriol and binders on hold since NPO. Will resume at least calcitriol.  6.  Nutrition -  Advance to renal diet      Louis Meckel    Labs: Basic Metabolic Panel: Recent Labs  Lab 12/22/20 0223 12/23/20 0125 12/24/20 0240  NA 140 143 138  K 4.2 3.6 3.9  CL 102 106 104  CO2 23 23 23   GLUCOSE 109* 110* 109*  BUN 135* 107* 113*  CREATININE 7.37* 5.75* 6.70*  CALCIUM 9.0 8.4* 8.4*   Liver Function Tests: Recent Labs  Lab 12/21/20 0834  AST 16  ALT 9  ALKPHOS 79  BILITOT 0.8  PROT 6.8  ALBUMIN 2.8*   Recent Labs  Lab 12/21/20 0834  LIPASE 73*   No results for input(s): AMMONIA in the last 168 hours. CBC: Recent Labs  Lab 12/21/20 0834 12/21/20 2052 12/22/20 0223 12/22/20 0932 12/23/20 0125 12/23/20 0859 12/24/20 0240  WBC 7.5 7.1 7.6  --  7.5  --  6.5  NEUTROABS 5.8  --   --   --   --   --   --  HGB 6.2* 6.0* 6.5*   < > 7.2* 7.9* 7.1*  HCT 20.6* 19.3* 20.9*   < > 20.6* 23.0* 20.6*  MCV 103.0* 99.0 98.1  --  92.0  --  93.2  PLT 168 147* 129*  --  108*  --  104*   < > = values in this interval not displayed.   Cardiac Enzymes: No results for input(s): CKTOTAL, CKMB, CKMBINDEX, TROPONINI in the last 168 hours. CBG: Recent Labs  Lab 12/23/20 1620 12/23/20 1946 12/23/20 2330 12/24/20 0325 12/24/20 0715  GLUCAP 88 185* 88 107* 118*    Iron Studies: No results for input(s): IRON, TIBC, TRANSFERRIN, FERRITIN in the last 72 hours. Studies/Results: No results found. Medications: Infusions: . sodium chloride    . sodium chloride 100 mL (12/23/20 1444)    Scheduled Medications: . sodium chloride   Intravenous Once  . sodium chloride   Intravenous Once  . bictegravir-emtricitabine-tenofovir AF  1 tablet Oral Daily  . Chlorhexidine Gluconate Cloth  6 each Topical Q0600  . insulin aspart  0-6 Units Subcutaneous Q4H  . latanoprost  1 drop Both Eyes QHS  . pantoprazole  40 mg Oral  QAC breakfast  . thiamine  100 mg Oral Daily  . timolol  1 drop Both Eyes Daily  . umeclidinium-vilanterol  1 puff Inhalation Daily    have reviewed scheduled and prn medications.  Physical Exam: General: agitated, NAD Heart: RRR Lungs: mostly clear Abdomen: tender Extremities: min edema Dialysis Access: L  AVF patent    12/24/2020,9:15 AM  LOS: 2 days

## 2020-12-24 NOTE — Plan of Care (Signed)
Problem: Education: Goal: Knowledge of General Education information will improve Description: Including pain rating scale, medication(s)/side effects and non-pharmacologic comfort measures 12/24/2020 1734 by Morene Rankins, LPN Outcome: Adequate for Discharge 12/24/2020 1734 by Morene Rankins, LPN Outcome: Adequate for Discharge   Problem: Health Behavior/Discharge Planning: Goal: Ability to manage health-related needs will improve 12/24/2020 1734 by Morene Rankins, LPN Outcome: Adequate for Discharge 12/24/2020 1734 by Morene Rankins, LPN Outcome: Adequate for Discharge   Problem: Clinical Measurements: Goal: Ability to maintain clinical measurements within normal limits will improve 12/24/2020 1734 by Morene Rankins, LPN Outcome: Adequate for Discharge 12/24/2020 1734 by Morene Rankins, LPN Outcome: Adequate for Discharge Goal: Will remain free from infection 12/24/2020 1734 by Morene Rankins, LPN Outcome: Adequate for Discharge 12/24/2020 1734 by Morene Rankins, LPN Outcome: Adequate for Discharge Goal: Diagnostic test results will improve 12/24/2020 1734 by Morene Rankins, LPN Outcome: Adequate for Discharge 12/24/2020 1734 by Morene Rankins, LPN Outcome: Adequate for Discharge Goal: Respiratory complications will improve 12/24/2020 1734 by Morene Rankins, LPN Outcome: Adequate for Discharge 12/24/2020 1734 by Morene Rankins, LPN Outcome: Adequate for Discharge Goal: Cardiovascular complication will be avoided 12/24/2020 1734 by Morene Rankins, LPN Outcome: Adequate for Discharge 12/24/2020 1734 by Morene Rankins, LPN Outcome: Adequate for Discharge   Problem: Activity: Goal: Risk for activity intolerance will decrease 12/24/2020 1734 by Morene Rankins, LPN Outcome: Adequate for Discharge 12/24/2020 1734 by Morene Rankins, LPN Outcome: Adequate for Discharge   Problem: Nutrition: Goal: Adequate nutrition will be maintained 12/24/2020 1734 by Morene Rankins,  LPN Outcome: Adequate for Discharge 12/24/2020 1734 by Morene Rankins, LPN Outcome: Adequate for Discharge   Problem: Coping: Goal: Level of anxiety will decrease 12/24/2020 1734 by Morene Rankins, LPN Outcome: Adequate for Discharge 12/24/2020 1734 by Morene Rankins, LPN Outcome: Adequate for Discharge   Problem: Elimination: Goal: Will not experience complications related to bowel motility 12/24/2020 1734 by Morene Rankins, LPN Outcome: Adequate for Discharge 12/24/2020 1734 by Morene Rankins, LPN Outcome: Adequate for Discharge Goal: Will not experience complications related to urinary retention 12/24/2020 1734 by Morene Rankins, LPN Outcome: Adequate for Discharge 12/24/2020 1734 by Morene Rankins, LPN Outcome: Adequate for Discharge   Problem: Pain Managment: Goal: General experience of comfort will improve 12/24/2020 1734 by Morene Rankins, LPN Outcome: Adequate for Discharge 12/24/2020 1734 by Morene Rankins, LPN Outcome: Adequate for Discharge   Problem: Safety: Goal: Ability to remain free from injury will improve 12/24/2020 1734 by Morene Rankins, LPN Outcome: Adequate for Discharge 12/24/2020 1734 by Morene Rankins, LPN Outcome: Adequate for Discharge   Problem: Skin Integrity: Goal: Risk for impaired skin integrity will decrease 12/24/2020 1734 by Morene Rankins, LPN Outcome: Adequate for Discharge 12/24/2020 1734 by Morene Rankins, LPN Outcome: Adequate for Discharge   Problem: Education: Goal: Ability to identify signs and symptoms of gastrointestinal bleeding will improve 12/24/2020 1734 by Morene Rankins, LPN Outcome: Adequate for Discharge 12/24/2020 1734 by Morene Rankins, LPN Outcome: Adequate for Discharge   Problem: Bowel/Gastric: Goal: Will show no signs and symptoms of gastrointestinal bleeding 12/24/2020 1734 by Morene Rankins, LPN Outcome: Adequate for Discharge 12/24/2020 1734 by Morene Rankins, LPN Outcome: Adequate for Discharge    Problem: Fluid Volume: Goal: Will show no signs and symptoms of excessive bleeding 12/24/2020 1734 by Morene Rankins, LPN Outcome: Adequate for Discharge 12/24/2020 1734 by  Morene Rankins, LPN Outcome: Adequate for Discharge   Problem: Clinical Measurements: Goal: Complications related to the disease process, condition or treatment will be avoided or minimized 12/24/2020 1734 by Morene Rankins, LPN Outcome: Adequate for Discharge 12/24/2020 1734 by Morene Rankins, LPN Outcome: Adequate for Discharge

## 2020-12-25 ENCOUNTER — Telehealth: Payer: Self-pay | Admitting: *Deleted

## 2020-12-25 ENCOUNTER — Other Ambulatory Visit: Payer: Self-pay

## 2020-12-25 DIAGNOSIS — Z992 Dependence on renal dialysis: Secondary | ICD-10-CM | POA: Diagnosis not present

## 2020-12-25 DIAGNOSIS — N2581 Secondary hyperparathyroidism of renal origin: Secondary | ICD-10-CM | POA: Diagnosis not present

## 2020-12-25 DIAGNOSIS — D631 Anemia in chronic kidney disease: Secondary | ICD-10-CM | POA: Diagnosis not present

## 2020-12-25 DIAGNOSIS — N186 End stage renal disease: Secondary | ICD-10-CM | POA: Diagnosis not present

## 2020-12-25 LAB — TYPE AND SCREEN
ABO/RH(D): A POS
Antibody Screen: NEGATIVE
Unit division: 0
Unit division: 0
Unit division: 0
Unit division: 0
Unit division: 0

## 2020-12-25 LAB — BPAM RBC
Blood Product Expiration Date: 202203142359
Blood Product Expiration Date: 202203222359
Blood Product Expiration Date: 202203232359
Blood Product Expiration Date: 202203232359
Blood Product Expiration Date: 202203262359
ISSUE DATE / TIME: 202202282227
ISSUE DATE / TIME: 202203011148
ISSUE DATE / TIME: 202203011449
ISSUE DATE / TIME: 202203020426
ISSUE DATE / TIME: 202203031213
Unit Type and Rh: 6200
Unit Type and Rh: 6200
Unit Type and Rh: 6200
Unit Type and Rh: 6200
Unit Type and Rh: 6200

## 2020-12-25 NOTE — Telephone Encounter (Signed)
Transition Care Management Follow-Up Telephone Call   Date discharged and where:12/24/2020 Jacob [name]  How have you been since you were released from the hospital? Better but having to go to a lot of specialist and don't have time to schedule appointment  Any patient concerns? No  Items Reviewed:   Meds: Yes  Allergies:Yes  Dietary Changes Reviewed:Yes  Functional Questionnaire:  Independent-I Dependent-D  ADLs:I   Dressing- I    Eating-I   Maintaining continence-I   Transferring-I   Transportation-D   Meal Prep-I   Managing Meds- I  Confirmed importance and Date/Time of follow-up visits scheduled:Patient stated that he has too many Specialist appointments at this time and he will call back to schedule with our office.    Confirmed with patient if condition worsens to call PCP or go to the Emergency Dept. Patient was given office number and encouraged to call back with questions or concerns: Yes

## 2020-12-25 NOTE — Patient Outreach (Signed)
[facility]) Care Management  12/25/2020  Jacob [name] July 14, 1951 [id number]   [employer] [ACO] Patient: Jacob [name]  Patient transitioned home last evening, noted to be at a very high risk score for unplanned readmission risk.    Spoke with the patient via cell phone, HIPAA confirmed,  this morning regarding the benefits of [place] [facility] services.  Explained that [place] [facility] is a covered benefit of insurance and primary care provider. Rhona Raider, NP [facility]  Patient immediately states, "I don't want your services after the ordeal I went through with my medicines." Tried to inquire further and the patient hung up.  Patient declined services with [facility].  Natividad Brood, RN BSN [facility] Liaison  [phone] business mobile phone Toll free office [phone]  Fax number: [phone] [email or web address]

## 2020-12-28 ENCOUNTER — Telehealth: Payer: Self-pay | Admitting: Cardiovascular Disease

## 2020-12-28 DIAGNOSIS — N2581 Secondary hyperparathyroidism of renal origin: Secondary | ICD-10-CM | POA: Diagnosis not present

## 2020-12-28 DIAGNOSIS — D631 Anemia in chronic kidney disease: Secondary | ICD-10-CM | POA: Diagnosis not present

## 2020-12-28 DIAGNOSIS — Z992 Dependence on renal dialysis: Secondary | ICD-10-CM | POA: Diagnosis not present

## 2020-12-28 DIAGNOSIS — N186 End stage renal disease: Secondary | ICD-10-CM | POA: Diagnosis not present

## 2020-12-28 NOTE — Telephone Encounter (Signed)
His stent was > 1 year go. Given his GI bleed, reasonable to d/c. BUT I will leave final decision to Dr. [name].

## 2020-12-28 NOTE — Telephone Encounter (Signed)
Pt c/o medication issue:  1. Name of Medication: ticagrelor (BRILINTA) 90 MG TABS tablet  2. How are you currently taking this medication (dosage and times per day)? Not currently taking   3. Are you having a reaction (difficulty breathing--STAT)? Yes  4. What is your medication issue? Jacob [name] is calling stating the hospital took him off of this medication due to stating it was the cause of his bleeding. He is requesting Dr. Kyla Balzarine recommendations in regards to this. Please advise.

## 2020-12-28 NOTE — Telephone Encounter (Signed)
Will forward to PharmD for advisement. 

## 2020-12-29 ENCOUNTER — Ambulatory Visit (INDEPENDENT_AMBULATORY_CARE_PROVIDER_SITE_OTHER): Payer: Medicare Other | Admitting: Podiatry

## 2020-12-29 ENCOUNTER — Ambulatory Visit: Payer: Medicare Other | Admitting: Podiatry

## 2020-12-29 ENCOUNTER — Other Ambulatory Visit: Payer: Self-pay

## 2020-12-29 ENCOUNTER — Telehealth: Payer: Self-pay | Admitting: Cardiovascular Disease

## 2020-12-29 ENCOUNTER — Telehealth: Payer: Self-pay

## 2020-12-29 DIAGNOSIS — E0842 Diabetes mellitus due to underlying condition with diabetic polyneuropathy: Secondary | ICD-10-CM

## 2020-12-29 DIAGNOSIS — L84 Corns and callosities: Secondary | ICD-10-CM

## 2020-12-29 DIAGNOSIS — L97511 Non-pressure chronic ulcer of other part of right foot limited to breakdown of skin: Secondary | ICD-10-CM

## 2020-12-29 DIAGNOSIS — E08621 Diabetes mellitus due to underlying condition with foot ulcer: Secondary | ICD-10-CM

## 2020-12-29 NOTE — Telephone Encounter (Signed)
Patient called and left a message to return call to [phone].  Called the patient back. He wanted to know if there would be MD in office this coming Thursday to sign a form for his diabetic shoes. To Dr. Mariea Clonts to confirm willing to sign form.   To Dr. Mariea Clonts

## 2020-12-29 NOTE — Telephone Encounter (Signed)
Called and let patient know that once Dr. [name] reviews and decides, the patient will be called back.  Currently, he remains off Brilinta.

## 2020-12-29 NOTE — Telephone Encounter (Signed)
Of course.  Normally NP will fill everything out and I sign the forms in these cases.

## 2020-12-29 NOTE — Telephone Encounter (Signed)
Follow Up:     Pt is calling to find out what was decided about the Brilinta?[name]

## 2020-12-29 NOTE — Progress Notes (Signed)
Patient presented today for a foam casting for 3 pair custom diabetic shoe inserts.  Patient is measured with a Brannok device to be a size 12 wide.  Diabetic shoes are chosen from the [employer] The shoes are chosen are 1270  The patient will be contacted when the shoes and inserts are ready to be picked up.

## 2020-12-30 DIAGNOSIS — N186 End stage renal disease: Secondary | ICD-10-CM | POA: Diagnosis not present

## 2020-12-30 DIAGNOSIS — D631 Anemia in chronic kidney disease: Secondary | ICD-10-CM | POA: Diagnosis not present

## 2020-12-30 DIAGNOSIS — N2581 Secondary hyperparathyroidism of renal origin: Secondary | ICD-10-CM | POA: Diagnosis not present

## 2020-12-30 DIAGNOSIS — Z992 Dependence on renal dialysis: Secondary | ICD-10-CM | POA: Diagnosis not present

## 2020-12-30 NOTE — Telephone Encounter (Signed)
Patient called again to inquire about Dr.Reed signing forms as he had not heard back yet. Patient is aware of Dr.Reed's response.  I will forward message to Dinah as she will be the provider seeing patient tomorrow. Webb Silversmith will need to complete form as Dr.Reed indicated and give to her to sign once all other components complete.

## 2020-12-30 NOTE — Telephone Encounter (Signed)
Will complete form with patient during visit.

## 2020-12-30 NOTE — Telephone Encounter (Signed)
Jacob [name] D, RPH-CPP at 12/28/2020 4:35 PM  Status: Signed    His stent was > 1 year go. Given his GI bleed, reasonable to d/c. BUT I will leave final decision to Dr. [name].     Received message from Pharm D. Will go to DOD for further advisement.  Called patient, left message for patient to call back.

## 2020-12-30 NOTE — Telephone Encounter (Signed)
Apnt confirmed by patient.

## 2020-12-30 NOTE — Telephone Encounter (Signed)
Continue to hold brillinta just take ASA 81 mg

## 2020-12-30 NOTE — Telephone Encounter (Signed)
Called patient, made him aware of Dr. Kyla Balzarine recommendations. Patient agreed to plan.

## 2020-12-30 NOTE — Telephone Encounter (Signed)
DOD, Dr. Burt Knack advised that it was okay to stop Brilinta, but see if GI doctor is okay with him starting Aspirin 81 mg daily. Patient stated he does not have a GI doctor at this time. Will have provider advise at his follow up appointment.

## 2020-12-30 NOTE — Telephone Encounter (Signed)
Patient is returning call.  °

## 2020-12-31 ENCOUNTER — Encounter: Payer: Self-pay | Admitting: Family

## 2021-01-04 ENCOUNTER — Other Ambulatory Visit: Payer: Self-pay | Admitting: Orthopaedic Surgery

## 2021-01-04 DIAGNOSIS — D631 Anemia in chronic kidney disease: Secondary | ICD-10-CM | POA: Diagnosis not present

## 2021-01-04 DIAGNOSIS — N2581 Secondary hyperparathyroidism of renal origin: Secondary | ICD-10-CM | POA: Diagnosis not present

## 2021-01-04 DIAGNOSIS — N186 End stage renal disease: Secondary | ICD-10-CM | POA: Diagnosis not present

## 2021-01-04 DIAGNOSIS — Z992 Dependence on renal dialysis: Secondary | ICD-10-CM | POA: Diagnosis not present

## 2021-01-04 NOTE — Telephone Encounter (Signed)
Please advise 

## 2021-01-04 NOTE — Telephone Encounter (Signed)
Has not been back to office, needs ROV ucall thanks

## 2021-01-05 ENCOUNTER — Encounter: Payer: Self-pay | Admitting: Podiatry

## 2021-01-05 ENCOUNTER — Ambulatory Visit (INDEPENDENT_AMBULATORY_CARE_PROVIDER_SITE_OTHER): Payer: Medicare Other | Admitting: Podiatry

## 2021-01-05 ENCOUNTER — Other Ambulatory Visit: Payer: Self-pay

## 2021-01-05 DIAGNOSIS — M79675 Pain in left toe(s): Secondary | ICD-10-CM

## 2021-01-05 DIAGNOSIS — Z992 Dependence on renal dialysis: Secondary | ICD-10-CM | POA: Diagnosis not present

## 2021-01-05 DIAGNOSIS — L84 Corns and callosities: Secondary | ICD-10-CM | POA: Diagnosis not present

## 2021-01-05 DIAGNOSIS — M2012 Hallux valgus (acquired), left foot: Secondary | ICD-10-CM

## 2021-01-05 DIAGNOSIS — N186 End stage renal disease: Secondary | ICD-10-CM

## 2021-01-05 DIAGNOSIS — M2041 Other hammer toe(s) (acquired), right foot: Secondary | ICD-10-CM

## 2021-01-05 DIAGNOSIS — E0822 Diabetes mellitus due to underlying condition with diabetic chronic kidney disease: Secondary | ICD-10-CM | POA: Diagnosis not present

## 2021-01-05 DIAGNOSIS — M2011 Hallux valgus (acquired), right foot: Secondary | ICD-10-CM

## 2021-01-05 DIAGNOSIS — M79674 Pain in right toe(s): Secondary | ICD-10-CM

## 2021-01-05 DIAGNOSIS — B351 Tinea unguium: Secondary | ICD-10-CM

## 2021-01-05 DIAGNOSIS — Z794 Long term (current) use of insulin: Secondary | ICD-10-CM

## 2021-01-05 DIAGNOSIS — M2042 Other hammer toe(s) (acquired), left foot: Secondary | ICD-10-CM

## 2021-01-05 NOTE — Progress Notes (Signed)
  Subjective:  [name], male    DOB: [birth year],  MRN: [id number]  70 y.o. male presents with at risk foot care with h/o NIDDM with ESRD on hemodialysis and callus(es) b/l feet and painful thick toenails that are difficult to trim. Painful toenails interfere with ambulation. Aggravating factors include wearing enclosed shoe gear. Pain is relieved with periodic professional debridement. Painful calluses are aggravated when weightbearing with and without shoegear. Pain is relieved with periodic professional debridement..    Dialysis days are MWF.   PCP: [name], NP and last visit was:   Review of Systems: Negative except as noted in the HPI.   Allergies  Allergen Reactions  . Augmentin [Amoxicillin-Pot Clavulanate] Diarrhea    Severe diarrhea  . Mucinex Fast-Max Other (See Comments)    Intensive sweating   . Amphetamines Other (See Comments)    Unknown reaction    Objective:  There were no vitals filed for this visit. Constitutional Patient is a pleasant 70 y.o. African American male WD, WN in NAD. AAO x 3.  Vascular Capillary fill time to digits <3 seconds b/l lower extremities. Palpable pedal pulses b/l LE. Pedal hair absent. Lower extremity skin temperature gradient within normal limits. No pain with calf compression b/l. No edema noted b/l lower extremities. No cyanosis or clubbing noted.  Neurologic Normal speech. Pt has subjective symptoms of neuropathy. Protective sensation diminished with 10g monofilament b/l.  Dermatologic Pedal skin with normal turgor, texture and tone bilaterally. No open wounds bilaterally. No interdigital macerations bilaterally. Toenails 1-5 b/l elongated, discolored, dystrophic, thickened, crumbly with subungual debris and tenderness to dorsal palpation. Porokeratotic lesion(s) L hallux and submet head 4 right foot with subdermal hemorrhage. No erythema, no edema, no drainage, no fluctuance.  Orthopedic: Normal muscle strength 5/5  to all lower extremity muscle groups bilaterally. No pain crepitus or joint limitation noted with ROM b/l. Hallux valgus with bunion deformity noted b/l lower extremities. Hammertoes noted to the b/l lower extremities. Utilizes cane for ambulation assistance.   Hemoglobin A1C Latest Ref Rng & Units 12/15/2020 11/05/2020 02/05/2020  HGBA1C <5.7 % of total Hgb 4.6 4.9 4.4  Some recent data might be hidden    Assessment:   1. Pain due to onychomycosis of toenails of both feet   2. Pre-ulcerative calluses   3. Hallux valgus, acquired, bilateral   4. Acquired hammertoes of both feet   5. Diabetes mellitus due to underlying condition with chronic kidney disease on chronic dialysis, with long-term current use of insulin ([place])    Plan:  Patient was evaluated and treated and all questions answered.  Onychomycosis with pain -Nails palliatively debridement as below. -Educated on self-care  Procedure: Nail Debridement Rationale: Pain Type of Debridement: manual, sharp debridement. Instrumentation: Nail nipper, rotary burr. Number of Nails: 10  -Examined patient. -Patient to continue soft, supportive shoe gear daily. -Toenails 1-5 b/l were debrided in length and girth with sterile nail nippers and dremel without iatrogenic bleeding.  -Preulcerativel porokeratotic lesion(s) L hallux and submet head 4 right foot pared and enucleated with sterile scalpel blade without incident. -Patient to report any pedal injuries to medical professional immediately. -Patient/POA to call should there be question/concern in the interim.  Return in about 7 weeks (around 02/23/2021).  Marzetta Board, DPM

## 2021-01-06 DIAGNOSIS — N186 End stage renal disease: Secondary | ICD-10-CM | POA: Diagnosis not present

## 2021-01-06 DIAGNOSIS — Z992 Dependence on renal dialysis: Secondary | ICD-10-CM | POA: Diagnosis not present

## 2021-01-06 DIAGNOSIS — N2581 Secondary hyperparathyroidism of renal origin: Secondary | ICD-10-CM | POA: Diagnosis not present

## 2021-01-06 DIAGNOSIS — D631 Anemia in chronic kidney disease: Secondary | ICD-10-CM | POA: Diagnosis not present

## 2021-01-06 NOTE — Progress Notes (Deleted)
Cardiology Office Note   Date:  01/06/2021   ID:  Jacob [name], DOB 07/28/1951, MRN [id number]  PCP:  [name], NP  Cardiologist: Dr. Jenkins Rouge, MD  No chief complaint on file.     History of Present Illness: Jacob [name] is a 70 y.o. male who presents for hospital follow-up, seen for Dr. Admission.   Jacob [name] has a history of CAD s/p CABG x2 in 2004, ischemic cardiomyopathy/systolic CHF with an LVEF at 40 to 45% 01/2019, ESRD on HD, HIV, HTN, HLD, DM2, depression, GI bleed requiring PRBCs, anemia on IV iron, tobacco and alcohol abuse.  He was recently seen in hospital consultation 10/17/2020 for the evaluation of chest pain with elevated troponin in the setting of hyperkalemia after missed dialysis x1 week.  Troponin levels found to be 73, 222, 437.  Hemoglobin markedly decreased at 6.5 with a potassium at 6.2.  His Brilinta was held due to greater than 6 months since PCI.  He underwent an echocardiogram      1 elevated troponin-patient denies chest pain.  Echocardiogram shows LV function that is unchanged.  Would not pursue further ischemia evaluation at this point.  Note his elevation occurred in the setting of missing dialysis with worsening renal function and in the setting of anemia.  2 coronary artery disease status post coronary artery bypass graft-continue aspirin and statin at discharge.  Would stay off of Brilinta given worsening anemia and patient's last stent was in February.  3 end-stage renal disease-Per nephrology.  4 anemia-status post EGD showing gastric AVMs.  Further management per primary service.  Cardiology will sign off.  Please call with questions.  Continue present cardiac medications at discharge.  Follow-up Dr. [name] 6 to 12 weeks.   His cardiac history dates back to 2004 when he presented with left arm tingling and pain and underwent diagnostic catheterization revealing severe multivessel disease.  EF was 20% at the time.   After initial deferral, he subsequently underwent CABG x2.  He continued to have LV dysfunction (25-30% 2008) on subsequent echocardiograms but was not felt to be an ICD candidate due to cocaine abuse at the time.  LV function subsequently improved slightly and has been trending in the 35 to 50% range since January 2011 with his most recent echocardiogram in 01/2019 showing an EF of 45 to 50%.  Most recent ischemic testing performed in February 2020 was nonischemic with large inferior wall infarct.  Over the years, has had progressive renal failure and initiated hemodialysis October 2020.  In the setting of anemia, he has required outpatient iron infusions.    Iermedius with total occlusions of the ramus intn February 2021, he noted worsening dyspnea and was seen in the office with subsequent right and left heart diagnostic catheterization revealing severe stenosis in the vein graft to the ramus intermedius, left circumflex, and right coronary artery.  The vein graft to the RPDA was widely patent.  He had 90% apical stenosis in the LAD.  The vein graft to the ramus intermedius was successfully intervened upon with a synergy XT drug-eluting stent.  Patient was placed on aspirin and Brilinta post procedure.  Following catheterization, in the setting of melena and anemia, he underwent GI evaluation with EGD showing angiodysplastic lesions with bleeding in the gastric fundus and gastric body.  Lesions were treated with accommodation of argon plasma, clipping, and epinephrine injection.  He was last seen in cardiology clinic in October 2021, at which time he  noted worsening dyspnea though he was felt to be euvolemic on examination.     1.  Demand Ischemia/CAD: Patient with a history of coronary artery disease status post two-vessel bypass in 2004 followed by more recent PCI and drug-eluting stent to the vein graft  ramus intermedius in February 2021.  At last cardiology visit in October of this year, patient  denied chest pain but did notice an increase in dyspnea on exertion despite stable weight and volume.  He presented to the emergency department on the evening of December 24 after missing dialysis for the entire week due to increase in alcoholism and depression.  On arrival he reported left-sided chest and epigastric pain.  ECG showed right bundle branch block with old inferior infarct but no acute changes.  High-sensitivity troponin was mildly elevated on arrival at 68 with subsequent rise to 73  222  437.  He denies chest pain this morning.  During ER stay, he was found to be acutely anemic (H&H 6.5/19.9) and hyperkalemic with potassium of 6.2.  Given anemia and concern related to GI bleed, considering that he is greater than 6 months out from most recent PCI, it is reasonable to hold Brilinta.  We do recommend continuing aspirin if felt to be feasible by GI.  Will obtain a 2D echocardiogram to reevaluate LV function in the setting of presumed demand ischemia.  Will consider further ischemic evaluation pending echo and recovery from acute anemia and renal failure.  Consider beta-blocker nitrate therapy.  It appears that atorvastatin has fallen off of his list and I will add back.  2.  Normocytic anemia/history of GI bleed: Patient notes dark stools within the past few weeks but says these have resolved since presentation to the hospital.  H&H 6.5 and 19.9 this morning.  Currently receiving packed red blood cells.  Prior history of GI evaluation with EGD in February of this year revealing angiodysplastic lesions with bleeding in the gastric fundus and body.  As above, recommend holding Brilinta but will continue aspirin.  Continue IV PPI therapy.  3.  End-stage renal disease: Patient noncompliant with dialysis last week.  Last session was Friday, December 18.  He was hyperkalemic on arrival and this has been treated.  Pending dialysis this morning.  4.  Hyperkalemia: In the setting of #3.  He received  insulin, D50, and calcium gluconate in the emergency room.  5.  Heart failure with midrange ejection fraction/ischemic cardiomyopathy: Most recent echo in April 2020 with EF of 40-45%.  He does have crackles on examination but otherwise does not appear to be significantly volume overloaded.  Continue beta-blocker therapy. No ACEI/ARB/ARNI/Aldosterone antagonist in setting of end-stage renal disease and hyperkalemia.  Volume management per nephrology/hemodialysis.  6.  Hyperlipidemia: LDL of 29 in February 2021.  Resume atorvastatin therapy.  7.  Tobacco abuse: Currently smoking half a pack a day.  Cessation advised.  Will need additional counseling when more alert.  8.  Alcohol abuse: Drinking a bottle of alcohol daily over the past few weeks.  CIWA protocol ordered.  Will need cessation counseling and would likely benefit from social work consultation for outpatient resources.  9.  Type 2 diabetes mellitus: Per internal medicine.  10.  HIV:  Follows w/ ID as outpt.  Well-controlled per notes.  On BIKTARVY.   Past Medical History:  Diagnosis Date  . Acute respiratory failure ([place]) 03/01/2018  . Arthritis    "all over; mostly knees and back" (02/28/2018)  . Chronic lower back  pain    stenosis  . Community acquired pneumonia 09/06/2013  . COPD (chronic obstructive pulmonary disease) ([place])   . Coronary atherosclerosis of native coronary artery    a. 02/2003 s/p CABG x 2 (VG->RI, VG->RPDA; b. 11/2019 PCI: LM nl, LAD 90d, D3 50, RI 100, LCX 100p, OM3 100 - fills via L->L collats from D2/dLAD, RCA 100p, VG->RPDA ok, VG->RI 95 (3.5x48 Synergy XD DES).  . Drug abuse ([place])    hx; tested for cocaine as recently as 2/08. says he is not using drugs now - avoided defib. for this reason   . ESRD (end stage renal disease) ([place])    Hemo M-W-F- Richarda Blade  . Fall at home 10/2020  . GERD (gastroesophageal reflux disease)    takes OTC meds as needed  . GI bleeding    a. 11/2019 EGD: angiodysplastic  lesions w/ bleeding s/p argon plasma/clipping/epi inj.  . Glaucoma    uses eye drops daily  . Hepatitis B 1968   "tx'd w/isolation; caught it from toilet stools in gym"  . HFrEF (heart failure with reduced ejection fraction) ([place])    a. 01/2019 Echo: EF 40-45%, diffuse HK, mild basal septal hypertrophy.  . History of blood transfusion 03/01/2019  . History of colon polyps    benign  . History of gout    takes Allopurinol daily as well as Colchicine-if needed (02/28/2018)  . History of kidney stones   . HTN (hypertension)    takes Coreg,Imdur.and Apresoline daily  . Human immunodeficiency virus (HIV) disease ([place]) dx'd 78   on [place] as of 12/2020.    [name] Hyperlipidemia    takes Atorvastatin daily  . Ischemic cardiomyopathy    a. 01/2019 Echo: EF 40-45%, diffuse HK, mild basal septal hypertrophy. Diast dysfxn. Nl RV size/fxn. Sev dil LA. Triv MR/TR/PR.  [name] Muscle spasm    takes Zanaflex as needed  . Myocardial infarction ([place]) ~ 2004/2005  . Nocturia   . Peripheral neuropathy    takes gabapentin daily  . Pneumonia    "at least twice" (02/28/2018)  . Syphilis, unspecified   . Type II diabetes mellitus ([place]) 2004   Lantus daily.Average fasting blood sugar 125-199  . Wears glasses   . Wears partial dentures     Past Surgical History:  Procedure Laterality Date  . AV FISTULA PLACEMENT Left 08/02/2018   Procedure: ARTERIOVENOUS (AV) FISTULA CREATION  left arm radiocephlic;  Surgeon: Marty Heck, MD;  Location: [facility];  Service: Vascular;  Laterality: Left;  . AV FISTULA PLACEMENT Left 08/01/2019   Procedure: LEFT BRACHIOCEPHALIC ARTERIOVENOUS (AV) FISTULA CREATION;  Surgeon: [name], MD;  Location: [facility];  Service: Vascular;  Laterality: Left;  . BASCILIC VEIN TRANSPOSITION Left 10/03/2019   Procedure: BASILIC VEIN TRANSPOSITION LEFT SECOND STAGE;  Surgeon: [name], MD;  Location: [facility];  Service: Vascular;  Laterality: Left;  . CARDIAC CATHETERIZATION  10/2002;  12/19/2004   [name] 03/08/2011  . COLONOSCOPY  2013   Dr.John Henrene Pastor   . CORONARY ARTERY BYPASS GRAFT  02/24/2003   CABG X2/notes 03/08/2011  . CORONARY STENT INTERVENTION N/A 12/19/2019   Procedure: CORONARY STENT INTERVENTION;  Surgeon: Jettie Booze, MD;  Location: [facility];  Service: Cardiovascular;  Laterality: N/A;  . ESOPHAGOGASTRODUODENOSCOPY (EGD) WITH PROPOFOL N/A 02/08/2019   Procedure: ESOPHAGOGASTRODUODENOSCOPY (EGD) WITH PROPOFOL;  Surgeon: Milus Banister, MD;  Location: [facility];  Service: Gastroenterology;  Laterality: N/A;  . ESOPHAGOGASTRODUODENOSCOPY (EGD) WITH PROPOFOL N/A 12/22/2019  Procedure: ESOPHAGOGASTRODUODENOSCOPY (EGD) WITH PROPOFOL;  Surgeon: Lavena Bullion, DO;  Location: [facility];  Service: Gastroenterology;  Laterality: N/A;  . ESOPHAGOGASTRODUODENOSCOPY (EGD) WITH PROPOFOL N/A 10/19/2020   Procedure: ESOPHAGOGASTRODUODENOSCOPY (EGD) WITH PROPOFOL;  Surgeon: Jackquline Denmark, MD;  Location: [facility] ENDOSCOPY;  Service: Endoscopy;  Laterality: N/A;  . ESOPHAGOGASTRODUODENOSCOPY (EGD) WITH PROPOFOL N/A 12/22/2020   Procedure: ESOPHAGOGASTRODUODENOSCOPY (EGD) WITH PROPOFOL;  Surgeon: Gatha Mayer, MD;  Location: [facility];  Service: Endoscopy;  Laterality: N/A;  . HEMOSTASIS CLIP PLACEMENT  12/22/2019   Procedure: HEMOSTASIS CLIP PLACEMENT;  Surgeon: Lavena Bullion, DO;  Location: [facility];  Service: Gastroenterology;;  . HEMOSTASIS CLIP PLACEMENT  12/22/2020   Procedure: HEMOSTASIS CLIP PLACEMENT;  Surgeon: Gatha Mayer, MD;  Location[facility];  Service: Endoscopy;;  . HEMOSTASIS CONTROL  12/22/2020   Procedure: HEMOSTASIS CONTROL/hemospray;  Surgeon: Gatha Mayer, MD;  Location: [facility];  Service: Endoscopy;;  . HOT HEMOSTASIS N/A 02/08/2019   Procedure: HOT HEMOSTASIS (ARGON PLASMA COAGULATION/BICAP);  Surgeon: Milus Banister, MD;  Location: [facility] ENDOSCOPY;  Service: Gastroenterology;  Laterality: N/A;  . HOT HEMOSTASIS  N/A 12/22/2019   Procedure: HOT HEMOSTASIS (ARGON PLASMA COAGULATION/BICAP);  Surgeon: Lavena Bullion, DO;  Location: [facility] ENDOSCOPY;  Service: Gastroenterology;  Laterality: N/A;  . HOT HEMOSTASIS N/A 10/19/2020   Procedure: HOT HEMOSTASIS (ARGON PLASMA COAGULATION/BICAP);  Surgeon: Jackquline Denmark, MD;  Location: [facility] ENDOSCOPY;  Service: Endoscopy;  Laterality: N/A;  . HOT HEMOSTASIS N/A 12/22/2020   Procedure: HOT HEMOSTASIS (ARGON PLASMA COAGULATION/BICAP);  Surgeon: Gatha Mayer, MD;  Location: [facility] ENDOSCOPY;  Service: Endoscopy;  Laterality: N/A;  . INTERTROCHANTERIC HIP FRACTURE SURGERY Left 11/2006   [name] 03/08/2011  . INTRAVASCULAR ULTRASOUND/IVUS N/A 12/19/2019   Procedure: Intravascular Ultrasound/IVUS;  Surgeon: Jettie Booze, MD;  Location: [facility];  Service: Cardiovascular;  Laterality: N/A;  . IR FLUORO GUIDE CV LINE RIGHT  07/24/2019  . IR FLUORO GUIDE CV LINE RIGHT  07/30/2019  . IR US GUIDE VASC ACCESS RIGHT  07/24/2019  . IR US GUIDE VASC ACCESS RIGHT  07/30/2019  . LAPAROSCOPIC CHOLECYSTECTOMY  05/2006  . LIGATION OF COMPETING BRANCHES OF ARTERIOVENOUS FISTULA Left 11/05/2018   Procedure: LIGATION OF COMPETING BRANCHES OF ARTERIOVENOUS FISTULA  LEFT  ARM;  Surgeon: Marty Heck, MD;  Location: [facility];  Service: Vascular;  Laterality: Left;  . LUMBAR LAMINECTOMY/DECOMPRESSION MICRODISCECTOMY N/A 02/29/2016   Procedure: Left L4-5 Lateral Recess Decompression, Removal Extradural Intraspinal Facet Cyst;  Surgeon: Marybelle Killings, MD;  Location: [facility];  Service: Orthopedics;  Laterality: N/A;  . MULTIPLE TOOTH EXTRACTIONS    . ORIF MANDIBULAR FRACTURE Left 08/13/2004   ORIF of left body fracture mandible with KLS Martin 2.3-mm six hole/notes 03/08/2011  . RIGHT/LEFT HEART CATH AND CORONARY/GRAFT ANGIOGRAPHY N/A 12/19/2019   Procedure: RIGHT/LEFT HEART CATH AND CORONARY/GRAFT ANGIOGRAPHY;  Surgeon: Jettie Booze, MD;  Location: [facility];  Service:  Cardiovascular;  Laterality: N/A;  . SCLEROTHERAPY  12/22/2019   Procedure: SCLEROTHERAPY;  Surgeon: Lavena Bullion, DO;  Location: [facility] ENDOSCOPY;  Service: Gastroenterology;;     Current Outpatient Medications  Medication Sig Dispense Refill  . acetaminophen (TYLENOL) 650 MG CR tablet Take 650 mg by mouth 3 (three) times daily.    [name] allopurinol (ZYLOPRIM) 100 MG tablet TAKE 1 TABLET(100 MG) BY MOUTH DAILY (Patient taking differently: Take 100 mg by mouth daily.) 90 tablet 1  . ANORO ELLIPTA 62.5-25 MCG/INH AEPB INHALE 1 PUFF BY MOUTH  EVERY DAY (Patient taking differently: Inhale 1 puff into the lungs daily.) 60 each 5  . B-D UF III MINI PEN NEEDLES 31G X 5 MM MISC USE FOUR TIMES DAILY 100 each 11  . bictegravir-emtricitabine-tenofovir AF (BIKTARVY) 50-200-25 MG TABS tablet Take 1 tablet by mouth daily. 30 tablet 11  . carvedilol (COREG) 3.125 MG tablet Take 1 tablet (3.125 mg total) by mouth 2 (two) times daily. 180 tablet 3  . Cinacalcet HCl (SENSIPAR PO) Take 1 tablet by mouth 3 (three) times a week. At dialysis    . colchicine 0.6 MG tablet Take 0.6-1.2 mg by mouth daily as needed (gout flare-up).    . diclofenac Sodium (VOLTAREN) 1 % GEL APPLY 2 GRAMS TO THE AFFECTED AREA THREE TIMES DAILY AS NEEDED FOR PAIN (Patient taking differently: Apply 2 g topically 3 (three) times daily as needed (pain).) 517 g 0  . folic acid (FOLVITE) 1 MG tablet Take 1 tablet (1 mg total) by mouth daily. 90 tablet 0  . gabapentin (NEURONTIN) 300 MG capsule Take 1 capsule (300 mg total) by mouth daily. Dose change due to renal function 90 capsule 1  . insulin lispro (HUMALOG KWIKPEN) 100 UNIT/ML KwikPen Inject 0.71ml (20 Units total) into the skin 3 (three) times daily as needed for high blood sugar (above 150) (Patient taking differently: Inject 0.25ml (20 Units total) into the skin 3 (three) times daily as needed for high blood sugar (above 150)) 15 pen 1  . iron sucrose in sodium chloride 0.9 % 100 mL Iron  Sucrose (Venofer)    . isosorbide mononitrate (IMDUR) 30 MG 24 hr tablet TAKE 1 TABLET BY MOUTH EVERY DAY (Patient taking differently: Take 30 mg by mouth daily.) 90 tablet 1  . Lancets (ONETOUCH DELICA PLUS [id number]) MISC Inject 1 Device as directed 3 (three) times daily. Dx: E11.40 300 each 3  . latanoprost (XALATAN) 0.005 % ophthalmic solution Place 1 drop into both eyes at bedtime.    . Methoxy PEG-Epoetin Beta (MIRCERA IJ) Mircera    . multivitamin (RENA-VIT) TABS tablet Take 1 tablet by mouth daily.    . nitroGLYCERIN (NITROSTAT) 0.3 MG SL tablet ONE TABLET UNDER TONGUE AS NEEDED FOR CHEST PAIN (Patient taking differently: Place 0.3 mg under the tongue every 5 (five) minutes as needed for chest pain.) 25 tablet 4  . Nutritional Supplements (VITAMIN D BOOSTER PO) Take 1 each by mouth 2 (two) times daily.    Glory Rosebush ULTRA test strip CHECK BLOOD SUGAR 3 TIMES  DAILY (Patient taking differently: 1 each by Other route 3 (three) times daily.) 300 strip 3  . pantoprazole (PROTONIX) 40 MG tablet TAKE 1 TABLET(40 MG) BY MOUTH TWICE DAILY (Patient taking differently: Take 40 mg by mouth 2 (two) times daily.) 180 tablet 0  . polyethylene glycol (MIRALAX / GLYCOLAX) 17 g packet Take 17 g by mouth daily as needed for mild constipation. 14 each 0  . sevelamer carbonate (RENVELA) 800 MG tablet Take 800 mg by mouth 3 (three) times daily with meals.    . timolol (TIMOPTIC) 0.5 % ophthalmic solution Place 1 drop into both eyes daily.    . traMADol (ULTRAM) 50 MG tablet Take 1 tablet (50 mg total) by mouth 2 (two) times daily as needed. 20 tablet 0   No current facility-administered medications for this visit.    Allergies:   Augmentin [amoxicillin-pot clavulanate], Mucinex fast-max, and Amphetamines    Social History:  The patient  reports that he has been smoking  cigarettes. He has a 21.50 pack-year smoking history. He has never used smokeless tobacco. He reports previous alcohol use of about 12.0  standard drinks of alcohol per week. He reports previous drug use. Drug: Cocaine.   Family History:  The patient's ***family history includes Alzheimer's disease in his mother and sister; Diabetes in his brother, brother, and sister; Drug abuse in his brother; Heart attack in his brother; Heart failure in his father; Hypertension in his brother and father; Stroke in his sister.    ROS:  Please see the history of present illness.   Otherwise, review of systems are positive for {NONE DEFAULTED:18576::"none"}.   All other systems are reviewed and negative.    PHYSICAL EXAM: VS:  There were no vitals taken for this visit. , BMI There is no height or weight on file to calculate BMI. GEN: Well nourished, well developed, in no acute distress HEENT: normal Neck: no JVD, carotid bruits, or masses Cardiac: ***RRR; no murmurs, rubs, or gallops,no edema  Respiratory:  clear to auscultation bilaterally, normal work of breathing GI: soft, nontender, nondistended, + BS MS: no deformity or atrophy Skin: warm and dry, no rash Neuro:  Strength and sensation are intact Psych: euthymic mood, full affect   EKG:  EKG {ACTION; IS/IS XBO:[id number]} ordered today. The ekg ordered today demonstrates ***   Recent Labs: 12/15/2020: TSH 0.49 12/21/2020: ALT 9 12/23/2020: Magnesium 1.9 12/24/2020: BUN 113; Creatinine, Ser 6.70; Hemoglobin 7.1; Platelets 104; Potassium 3.9; Sodium 138    Lipid Panel    Component Value Date/Time   CHOL 143 12/15/2020 1105   CHOL 121 08/07/2015 1041   TRIG 248 (H) 12/15/2020 1105   HDL 31 (L) 12/15/2020 1105   HDL 32 (L) 08/07/2015 1041   CHOLHDL 4.6 12/15/2020 1105   VLDL 19 12/21/2019 0352   LDLCALC 79 12/15/2020 1105      Wt Readings from Last 3 Encounters:  12/24/20 177 lb 4 oz (80.4 kg)  12/21/20 177 lb (80.3 kg)  12/15/20 177 lb 6.4 oz (80.5 kg)      Other studies Reviewed: Additional studies/ records that were reviewed today include: ***. Review of the above  records demonstrates: ***   ASSESSMENT AND PLAN:  1.  ***   Current medicines are reviewed at length with the patient today.  The patient {ACTIONS; HAS/DOES NOT HAVE:19233} concerns regarding medicines.  The following changes have been made:  {PLAN; NO CHANGE:13088:s}  Labs/ tests ordered today include: *** No orders of the defined types were placed in this encounter.    Disposition:   FU with *** in {gen number [id number]} {Days to years:10300}  Signed, Kathyrn Drown, NP  01/06/2021 11:29 AM    [facility] [address], [place], [place]  [address] Phone: [phone]; Fax: [phone]

## 2021-01-07 ENCOUNTER — Ambulatory Visit: Payer: Medicare Other | Admitting: Family

## 2021-01-07 ENCOUNTER — Telehealth: Payer: Self-pay | Admitting: *Deleted

## 2021-01-07 ENCOUNTER — Ambulatory Visit: Payer: Medicare Other | Admitting: Cardiology

## 2021-01-07 ENCOUNTER — Encounter (HOSPITAL_COMMUNITY): Payer: Self-pay

## 2021-01-07 ENCOUNTER — Observation Stay (HOSPITAL_COMMUNITY)
Admission: EM | Admit: 2021-01-07 | Discharge: 2021-01-09 | Disposition: A | Discharge status: Home / Self Care | Payer: Medicare Other | Attending: Internal Medicine | Admitting: Internal Medicine

## 2021-01-07 DIAGNOSIS — K921 Melena: Secondary | ICD-10-CM | POA: Diagnosis present

## 2021-01-07 DIAGNOSIS — Z7902 Long term (current) use of antithrombotics/antiplatelets: Secondary | ICD-10-CM

## 2021-01-07 DIAGNOSIS — N186 End stage renal disease: Secondary | ICD-10-CM | POA: Insufficient documentation

## 2021-01-07 DIAGNOSIS — D649 Anemia, unspecified: Secondary | ICD-10-CM

## 2021-01-07 DIAGNOSIS — I5022 Chronic systolic (congestive) heart failure: Secondary | ICD-10-CM

## 2021-01-07 DIAGNOSIS — I509 Heart failure, unspecified: Secondary | ICD-10-CM

## 2021-01-07 DIAGNOSIS — F17210 Nicotine dependence, cigarettes, uncomplicated: Secondary | ICD-10-CM | POA: Insufficient documentation

## 2021-01-07 DIAGNOSIS — K31811 Angiodysplasia of stomach and duodenum with bleeding: Principal | ICD-10-CM

## 2021-01-07 DIAGNOSIS — I5042 Chronic combined systolic (congestive) and diastolic (congestive) heart failure: Secondary | ICD-10-CM | POA: Insufficient documentation

## 2021-01-07 DIAGNOSIS — Z951 Presence of aortocoronary bypass graft: Secondary | ICD-10-CM | POA: Diagnosis not present

## 2021-01-07 DIAGNOSIS — B20 Human immunodeficiency virus [HIV] disease: Secondary | ICD-10-CM | POA: Diagnosis present

## 2021-01-07 DIAGNOSIS — E119 Type 2 diabetes mellitus without complications: Secondary | ICD-10-CM

## 2021-01-07 DIAGNOSIS — D631 Anemia in chronic kidney disease: Secondary | ICD-10-CM | POA: Diagnosis not present

## 2021-01-07 DIAGNOSIS — I132 Hypertensive heart and chronic kidney disease with heart failure and with stage 5 chronic kidney disease, or end stage renal disease: Secondary | ICD-10-CM | POA: Diagnosis not present

## 2021-01-07 DIAGNOSIS — K922 Gastrointestinal hemorrhage, unspecified: Secondary | ICD-10-CM | POA: Diagnosis not present

## 2021-01-07 DIAGNOSIS — E1142 Type 2 diabetes mellitus with diabetic polyneuropathy: Secondary | ICD-10-CM | POA: Diagnosis not present

## 2021-01-07 DIAGNOSIS — K31819 Angiodysplasia of stomach and duodenum without bleeding: Secondary | ICD-10-CM | POA: Diagnosis present

## 2021-01-07 DIAGNOSIS — D62 Acute posthemorrhagic anemia: Secondary | ICD-10-CM | POA: Diagnosis present

## 2021-01-07 DIAGNOSIS — I5043 Acute on chronic combined systolic (congestive) and diastolic (congestive) heart failure: Secondary | ICD-10-CM | POA: Diagnosis present

## 2021-01-07 DIAGNOSIS — J449 Chronic obstructive pulmonary disease, unspecified: Secondary | ICD-10-CM | POA: Diagnosis not present

## 2021-01-07 DIAGNOSIS — Z20822 Contact with and (suspected) exposure to covid-19: Secondary | ICD-10-CM | POA: Insufficient documentation

## 2021-01-07 DIAGNOSIS — I2510 Atherosclerotic heart disease of native coronary artery without angina pectoris: Secondary | ICD-10-CM | POA: Diagnosis not present

## 2021-01-07 DIAGNOSIS — Z79899 Other long term (current) drug therapy: Secondary | ICD-10-CM | POA: Diagnosis not present

## 2021-01-07 DIAGNOSIS — Z992 Dependence on renal dialysis: Secondary | ICD-10-CM | POA: Diagnosis not present

## 2021-01-07 DIAGNOSIS — E1122 Type 2 diabetes mellitus with diabetic chronic kidney disease: Secondary | ICD-10-CM | POA: Diagnosis not present

## 2021-01-07 DIAGNOSIS — K449 Diaphragmatic hernia without obstruction or gangrene: Secondary | ICD-10-CM | POA: Diagnosis not present

## 2021-01-07 DIAGNOSIS — I10 Essential (primary) hypertension: Secondary | ICD-10-CM | POA: Diagnosis present

## 2021-01-07 DIAGNOSIS — Z794 Long term (current) use of insulin: Secondary | ICD-10-CM | POA: Diagnosis not present

## 2021-01-07 LAB — COMPREHENSIVE METABOLIC PANEL
ALT: 13 U/L (ref 0–44)
AST: 21 U/L (ref 15–41)
Albumin: 3.1 g/dL — ABNORMAL LOW (ref 3.5–5.0)
Alkaline Phosphatase: 86 U/L (ref 38–126)
Anion gap: 11 (ref 5–15)
BUN: 64 mg/dL — ABNORMAL HIGH (ref 8–23)
CO2: 30 mmol/L (ref 22–32)
Calcium: 9.3 mg/dL (ref 8.9–10.3)
Chloride: 95 mmol/L — ABNORMAL LOW (ref 98–111)
Creatinine, Ser: 5.19 mg/dL — ABNORMAL HIGH (ref 0.61–1.24)
GFR, Estimated: 11 mL/min — ABNORMAL LOW (ref >60)
Glucose, Bld: 128 mg/dL — ABNORMAL HIGH (ref 70–99)
Potassium: 4.3 mmol/L (ref 3.5–5.1)
Sodium: 136 mmol/L (ref 135–145)
Total Bilirubin: 0.6 mg/dL (ref 0.3–1.2)
Total Protein: 6.5 g/dL (ref 6.5–8.1)

## 2021-01-07 LAB — POC OCCULT BLOOD, ED: Fecal Occult Bld: POSITIVE — AB

## 2021-01-07 LAB — CBC
HCT: 26.2 % — ABNORMAL LOW (ref 39.0–52.0)
Hemoglobin: 8 g/dL — ABNORMAL LOW (ref 13.0–17.0)
MCH: 30.3 pg (ref 26.0–34.0)
MCHC: 30.5 g/dL (ref 30.0–36.0)
MCV: 99.2 fL (ref 80.0–100.0)
Platelets: 146 10*3/uL — ABNORMAL LOW (ref 150–400)
RBC: 2.64 MIL/uL — ABNORMAL LOW (ref 4.22–5.81)
RDW: 15.9 % — ABNORMAL HIGH (ref 11.5–15.5)
WBC: 5.7 10*3/uL (ref 4.0–10.5)
nRBC: 0 % (ref 0.0–0.2)

## 2021-01-07 LAB — GLUCOSE, CAPILLARY: Glucose-Capillary: 161 mg/dL — ABNORMAL HIGH (ref 70–99)

## 2021-01-07 LAB — SARS CORONAVIRUS 2 (TAT 6-24 HRS): SARS Coronavirus 2: NEGATIVE

## 2021-01-07 MED ORDER — RENA-VITE PO TABS
1.0000 | ORAL_TABLET | Freq: Every day | ORAL | Status: DC
  Administered 2021-01-08: 1 via ORAL
  Filled 2021-01-07 (×2): qty 1

## 2021-01-07 MED ORDER — INSULIN ASPART 100 UNIT/ML ~~LOC~~ SOLN
0.0000 [IU] | Freq: Three times a day (TID) | SUBCUTANEOUS | Status: DC
  Administered 2021-01-08: 2 [IU] via SUBCUTANEOUS

## 2021-01-07 MED ORDER — FOLIC ACID 1 MG PO TABS
1.0000 mg | ORAL_TABLET | Freq: Every day | ORAL | Status: DC
  Administered 2021-01-08: 1 mg via ORAL
  Filled 2021-01-07 (×2): qty 1

## 2021-01-07 MED ORDER — LATANOPROST 0.005 % OP SOLN
1.0000 [drp] | Freq: Every day | OPHTHALMIC | Status: DC
  Administered 2021-01-07 – 2021-01-08 (×2): 1 [drp] via OPHTHALMIC
  Filled 2021-01-07: qty 2.5

## 2021-01-07 MED ORDER — BICTEGRAVIR-EMTRICITAB-TENOFOV 50-200-25 MG PO TABS
1.0000 | ORAL_TABLET | Freq: Every day | ORAL | Status: DC
  Administered 2021-01-08: 1 via ORAL
  Filled 2021-01-07 (×2): qty 1

## 2021-01-07 MED ORDER — ACETAMINOPHEN 500 MG PO TABS
1000.0000 mg | ORAL_TABLET | Freq: Three times a day (TID) | ORAL | Status: DC
  Administered 2021-01-07 – 2021-01-09 (×5): 1000 mg via ORAL
  Filled 2021-01-07 (×5): qty 2

## 2021-01-07 MED ORDER — ISOSORBIDE MONONITRATE ER 30 MG PO TB24
30.0000 mg | ORAL_TABLET | Freq: Every day | ORAL | Status: DC
Start: 2021-01-08 — End: 2021-01-09
  Administered 2021-01-08: 30 mg via ORAL
  Filled 2021-01-07 (×2): qty 1

## 2021-01-07 MED ORDER — POLYETHYLENE GLYCOL 3350 17 G PO PACK: 17.0000 g | PACK | Freq: Every day | ORAL | Status: DC | PRN

## 2021-01-07 MED ORDER — UMECLIDINIUM-VILANTEROL 62.5-25 MCG/INH IN AEPB
1.0000 | INHALATION_SPRAY | Freq: Every day | RESPIRATORY_TRACT | Status: DC
  Administered 2021-01-08: 1 via RESPIRATORY_TRACT
  Filled 2021-01-07: qty 14

## 2021-01-07 MED ORDER — DICLOFENAC SODIUM 1 % EX GEL
2.0000 g | Freq: Three times a day (TID) | CUTANEOUS | Status: DC | PRN
  Filled 2021-01-07: qty 100

## 2021-01-07 MED ORDER — PANTOPRAZOLE SODIUM 40 MG PO TBEC
40.0000 mg | DELAYED_RELEASE_TABLET | Freq: Two times a day (BID) | ORAL | Status: DC
  Administered 2021-01-08 (×2): 40 mg via ORAL
  Filled 2021-01-07 (×4): qty 1

## 2021-01-07 MED ORDER — TIMOLOL MALEATE 0.5 % OP SOLN
1.0000 [drp] | Freq: Every day | OPHTHALMIC | Status: DC
  Administered 2021-01-08: 1 [drp] via OPHTHALMIC
  Filled 2021-01-07: qty 5

## 2021-01-07 MED ORDER — GABAPENTIN 300 MG PO CAPS
300.0000 mg | ORAL_CAPSULE | Freq: Every day | ORAL | Status: DC
Start: 2021-01-07 — End: 2021-01-09
  Administered 2021-01-07 – 2021-01-08 (×2): 300 mg via ORAL
  Filled 2021-01-07 (×3): qty 1

## 2021-01-07 MED ORDER — COLCHICINE 0.6 MG PO TABS: 0.6000 mg | ORAL_TABLET | Freq: Every day | ORAL | Status: DC | PRN

## 2021-01-07 MED ORDER — NITROGLYCERIN 0.4 MG SL SUBL: 0.4000 mg | SUBLINGUAL_TABLET | SUBLINGUAL | Status: DC | PRN

## 2021-01-07 MED ORDER — SEVELAMER CARBONATE 800 MG PO TABS: 2400.0000 mg | ORAL_TABLET | Freq: Every day | ORAL | Status: DC

## 2021-01-07 MED ORDER — ALLOPURINOL 100 MG PO TABS
100.0000 mg | ORAL_TABLET | Freq: Every day | ORAL | Status: DC
  Administered 2021-01-08: 100 mg via ORAL
  Filled 2021-01-07 (×2): qty 1

## 2021-01-07 MED ORDER — SEVELAMER CARBONATE 800 MG PO TABS
2400.0000 mg | ORAL_TABLET | Freq: Every day | ORAL | Status: DC
  Administered 2021-01-08: 2400 mg via ORAL
  Filled 2021-01-07: qty 3

## 2021-01-07 MED ORDER — CARVEDILOL 3.125 MG PO TABS
3.1250 mg | ORAL_TABLET | Freq: Two times a day (BID) | ORAL | Status: DC
  Administered 2021-01-08 (×2): 3.125 mg via ORAL
  Filled 2021-01-07 (×3): qty 1

## 2021-01-07 NOTE — ED Triage Notes (Signed)
Pt reports black tarry stools since yesterday.Pt reports he stated taking a new medication called renvela and a possible side effect is black tarry stools. Pt is also dialysis patient M&W&F.Pt went yesterday and had full session.

## 2021-01-07 NOTE — ED Provider Notes (Signed)
[facility] EMERGENCY DEPARTMENT Provider Note   CSN: [id number] Arrival date & time: 01/07/21  1023     History Chief Complaint  Patient presents with  . Rectal Bleeding    Jacob [name] is a 70 y.o. male.  The history is provided by the patient. No language interpreter was used.  Rectal Bleeding Quality:  Black and tarry Amount:  Moderate Duration:  1 day Timing:  Constant Chronicity:  Recurrent Similar prior episodes: yes   Relieved by:  Nothing Ineffective treatments:  None tried Associated symptoms: no abdominal pain   Risk factors: anticoagulant use    Pt has a history of renal failure on dialysis .  Pt had an egd for upper gi bleed on 2/20.  Pt continues to have chronic bleeding.  Pt here today because stool was dark and he wanted to get hemoglobin checked     Past Medical History:  Diagnosis Date  . Acute respiratory failure ([place]) 03/01/2018  . Arthritis    "all over; mostly knees and back" (02/28/2018)  . Chronic lower back pain    stenosis  . Community acquired pneumonia 09/06/2013  . COPD (chronic obstructive pulmonary disease) ([place])   . Coronary atherosclerosis of native coronary artery    a. 02/2003 s/p CABG x 2 (VG->RI, VG->RPDA; b. 11/2019 PCI: LM nl, LAD 90d, D3 50, RI 100, LCX 100p, OM3 100 - fills via L->L collats from D2/dLAD, RCA 100p, VG->RPDA ok, VG->RI 95 (3.5x48 Synergy XD DES).  . Drug abuse ([place])    hx; tested for cocaine as recently as 2/08. says he is not using drugs now - avoided defib. for this reason   . ESRD (end stage renal disease) ([place])    Hemo M-W-F- Richarda Blade  . Fall at home 10/2020  . GERD (gastroesophageal reflux disease)    takes OTC meds as needed  . GI bleeding    a. 11/2019 EGD: angiodysplastic lesions w/ bleeding s/p argon plasma/clipping/epi inj.  . Glaucoma    uses eye drops daily  . Hepatitis B 1968   "tx'd w/isolation; caught it from toilet stools in gym"  . HFrEF (heart failure with reduced ejection  fraction) ([place])    a. 01/2019 Echo: EF 40-45%, diffuse HK, mild basal septal hypertrophy.  . History of blood transfusion 03/01/2019  . History of colon polyps    benign  . History of gout    takes Allopurinol daily as well as Colchicine-if needed (02/28/2018)  . History of kidney stones   . HTN (hypertension)    takes Coreg,Imdur.and Apresoline daily  . Human immunodeficiency virus (HIV) disease ([place]) dx'd 76   on [facility] as of 12/2020.    [name] Hyperlipidemia    takes Atorvastatin daily  . Ischemic cardiomyopathy    a. 01/2019 Echo: EF 40-45%, diffuse HK, mild basal septal hypertrophy. Diast dysfxn. Nl RV size/fxn. Sev dil LA. Triv MR/TR/PR.  [name] Muscle spasm    takes Zanaflex as needed  . Myocardial infarction ([place]) ~ 2004/2005  . Nocturia   . Peripheral neuropathy    takes gabapentin daily  . Pneumonia    "at least twice" (02/28/2018)  . Syphilis, unspecified   . Type II diabetes mellitus ([place]) 2004   Lantus daily.Average fasting blood sugar 125-199  . Wears glasses   . Wears partial dentures     Patient Active Problem List   Diagnosis Date Noted  . Acute upper GI bleed 11/24/2020  . COVID-19 virus infection 11/24/2020  .  Other mechanical complication of cranial or spinal infusion catheter, sequela 11/11/2020  . Subdural hematoma ([place]) 11/06/2020  . Fall at home, initial encounter 11/05/2020  . Nicotine dependence, cigarettes, uncomplicated 24/40/1027  . Alcohol abuse 11/05/2020  . GERD without esophagitis 11/05/2020  . Unspecified trochanteric fracture of left femur, initial encounter for closed fracture ([place]) 11/05/2020  . Traumatic subdural hematoma, initial encounter ([place]) 11/05/2020  . Fracture of metatarsal of right foot, closed 11/05/2020  . Nondisplaced fracture of greater trochanter of left femur, initial encounter for closed fracture ([place])   . Uremia   . AVM (arteriovenous malformation) of small bowel, acquired with hemorrhage   . Fluid overload 10/16/2020  .  Pruritus, unspecified 10/02/2020  . Encounter for removal of sutures 03/05/2020  . Cardiomyopathy ([place]) 12/21/2019  . Iron deficiency anemia   . Acute blood loss anemia   . Acute on chronic anemia   . AVM (arteriovenous malformation) of duodenum, acquired   . Abnormal nuclear stress test 12/19/2019  . Pain, unspecified 10/04/2019  . Type 2 diabetes mellitus with diabetic neuropathy, unspecified ([place]) 08/07/2019  . Allergy, unspecified, initial encounter 08/02/2019  . Complication of vascular dialysis catheter 08/02/2019  . Drug abuse counseling and surveillance of drug abuser 08/02/2019  . Other specified coagulation defects ([place]) 08/02/2019  . Secondary hyperparathyroidism of renal origin ([place]) 08/02/2019  . Unspecified atrial fibrillation ([place]) 08/02/2019  . ESRD on hemodialysis ([place])   . Hand pain, left   . Cocaine abuse ([place])   . Hypertensive emergency   . CHF exacerbation ([place]) 07/21/2019  . Diabetic foot ulcer ([place]) 02/11/2019  . Gastric AVM   . Melena 02/07/2019  . CKD stage 4 due to type 2 diabetes mellitus ([place]) 05/30/2018  . Hyperlipidemia associated with type 2 diabetes mellitus ([place]) 05/30/2018  . Right foot ulcer ([place]) 02/28/2018  . Chronic obstructive pulmonary disease ([place]) 02/28/2018  . Anemia of chronic disease 11/20/2016  . CKD (chronic kidney disease) stage 3, GFR 30-59 ml/min (H[facility]) 11/20/2016  . CHF (congestive heart failure) ([place]) 11/10/2016  . History of lumbar laminectomy for spinal cord decompression 02/29/2016  . Type 2 diabetes mellitus with diabetic polyneuropathy, with long-term current use of insulin ([place]) 06/17/2015  . HTN (hypertension) 04/26/2015  . DM (diabetes mellitus) ([place]) 04/26/2015  . Ischemic cardiomyopathy 05/12/2014  . Ulcer of lower extremity ([place]) 09/06/2013  . Hyperkalemia 09/06/2013  . Chest pain 09/06/2013  . Type II or unspecified type diabetes mellitus with unspecified complication, not stated as uncontrolled 10/22/2012  .  Bunion of left foot 11/25/2011  . Bunion, right foot 11/25/2011  . Polysubstance abuse ([place]) 07/28/2011  . Hip fracture, left ([place]) 04/04/2011  . Closed fracture of neck of femur ([place]) 04/04/2011  . Insomnia 02/18/2011  . Coronary artery disease involving native heart without angina pectoris 04/20/2009  . Chronic combined systolic (congestive) and diastolic (congestive) heart failure ([place]) 04/20/2009  . Mixed hyperlipidemia 11/20/2006  . Gout 11/20/2006  . TOBACCO ABUSE 11/20/2006  . Essential hypertension 11/20/2006  . Human immunodeficiency virus (HIV) disease ([place]) 09/02/2006    Past Surgical History:  Procedure Laterality Date  . AV FISTULA PLACEMENT Left 08/02/2018   Procedure: ARTERIOVENOUS (AV) FISTULA CREATION  left arm radiocephlic;  Surgeon: Marty Heck, MD;  Location: [facility];  Service: Vascular;  Laterality: Left;  . AV FISTULA PLACEMENT Left 08/01/2019   Procedure: LEFT BRACHIOCEPHALIC ARTERIOVENOUS (AV) FISTULA CREATION;  Surgeon: [name], MD;  Location: [facility];  Service: Vascular;  Laterality: Left;  .  BASCILIC VEIN TRANSPOSITION Left 10/03/2019   Procedure: BASILIC VEIN TRANSPOSITION LEFT SECOND STAGE;  Surgeon: [name], MD;  Location: [facility];  Service: Vascular;  Laterality: Left;  . CARDIAC CATHETERIZATION  10/2002; 12/19/2004   [name] 03/08/2011  . COLONOSCOPY  2013   Dr.John Henrene Pastor   . CORONARY ARTERY BYPASS GRAFT  02/24/2003   CABG X2/notes 03/08/2011  . CORONARY STENT INTERVENTION N/A 12/19/2019   Procedure: CORONARY STENT INTERVENTION;  Surgeon: Jettie Booze, MD;  Location: [facility] CV LAB;  Service: Cardiovascular;  Laterality: N/A;  . ESOPHAGOGASTRODUODENOSCOPY (EGD) WITH PROPOFOL N/A 02/08/2019   Procedure: ESOPHAGOGASTRODUODENOSCOPY (EGD) WITH PROPOFOL;  Surgeon: Milus Banister, MD;  Location: [facility];  Service: Gastroenterology;  Laterality: N/A;  . ESOPHAGOGASTRODUODENOSCOPY (EGD) WITH PROPOFOL N/A 12/22/2019   Procedure:  ESOPHAGOGASTRODUODENOSCOPY (EGD) WITH PROPOFOL;  Surgeon: Lavena Bullion, DO;  Location: [facility];  Service: Gastroenterology;  Laterality: N/A;  . ESOPHAGOGASTRODUODENOSCOPY (EGD) WITH PROPOFOL N/A 10/19/2020   Procedure: ESOPHAGOGASTRODUODENOSCOPY (EGD) WITH PROPOFOL;  Surgeon: Jackquline Denmark, MD;  Location: [facility]COPY;  Service: Endoscopy;  Laterality: N/A;  . ESOPHAGOGASTRODUODENOSCOPY (EGD) WITH PROPOFOL N/A 12/22/2020   Procedure: ESOPHAGOGASTRODUODENOSCOPY (EGD) WITH PROPOFOL;  Surgeon: Gatha Mayer, MD;  Location: [facility];  Service: Endoscopy;  Laterality: N/A;  . HEMOSTASIS CLIP PLACEMENT  12/22/2019   Procedure: HEMOSTASIS CLIP PLACEMENT;  Surgeon: Lavena Bullion, DO;  Location: [facility] ENDOSCOPY;  Service: Gastroenterology;;  . HEMOSTASIS CLIP PLACEMENT  12/22/2020   Procedure: HEMOSTASIS CLIP PLACEMENT;  Surgeon: Gatha Mayer, MD;  Location: [facility] ENDOSCOPY;  Service: Endoscopy;;  . HEMOSTASIS CONTROL  12/22/2020   Procedure: HEMOSTASIS CONTROL/hemospray;  Surgeon: Gatha Mayer, MD;  Location: [facility];  Service: Endoscopy;;  . HOT HEMOSTASIS N/A 02/08/2019   Procedure: HOT HEMOSTASIS (ARGON PLASMA COAGULATION/BICAP);  Surgeon: Milus Banister, MD;  Location: [facility] ENDOSCOPY;  Service: Gastroenterology;  Laterality: N/A;  . HOT HEMOSTASIS N/A 12/22/2019   Procedure: HOT HEMOSTASIS (ARGON PLASMA COAGULATION/BICAP);  Surgeon: Lavena Bullion, DO;  Location: [facility] ENDOSCOPY;  Service: Gastroenterology;  Laterality: N/A;  . HOT HEMOSTASIS N/A 10/19/2020   Procedure: HOT HEMOSTASIS (ARGON PLASMA COAGULATION/BICAP);  Surgeon: Jackquline Denmark, MD;  Location: [facility] ENDOSCOPY;  Service: Endoscopy;  Laterality: N/A;  . HOT HEMOSTASIS N/A 12/22/2020   Procedure: HOT HEMOSTASIS (ARGON PLASMA COAGULATION/BICAP);  Surgeon: Gatha Mayer, MD;  Location: [facility] ENDOSCOPY;  Service: Endoscopy;  Laterality: N/A;  . INTERTROCHANTERIC HIP FRACTURE SURGERY Left 11/2006   [name] 03/08/2011  .  INTRAVASCULAR ULTRASOUND/IVUS N/A 12/19/2019   Procedure: Intravascular Ultrasound/IVUS;  Surgeon: Jettie Booze, MD;  Location: [facility] CV LAB;  Service: Cardiovascular;  Laterality: N/A;  . IR FLUORO GUIDE CV LINE RIGHT  07/24/2019  . IR FLUORO GUIDE CV LINE RIGHT  07/30/2019  . IR US GUIDE VASC ACCESS RIGHT  07/24/2019  . IR US GUIDE VASC ACCESS RIGHT  07/30/2019  . LAPAROSCOPIC CHOLECYSTECTOMY  05/2006  . LIGATION OF COMPETING BRANCHES OF ARTERIOVENOUS FISTULA Left 11/05/2018   Procedure: LIGATION OF COMPETING BRANCHES OF ARTERIOVENOUS FISTULA  LEFT  ARM;  Surgeon: Marty Heck, MD;  Location: [facility];  Service: Vascular;  Laterality: Left;  . LUMBAR LAMINECTOMY/DECOMPRESSION MICRODISCECTOMY N/A 02/29/2016   Procedure: Left L4-5 Lateral Recess Decompression, Removal Extradural Intraspinal Facet Cyst;  Surgeon: Marybelle Killings, MD;  Location: [facility];  Service: Orthopedics;  Laterality: N/A;  . MULTIPLE TOOTH EXTRACTIONS    . ORIF MANDIBULAR FRACTURE Left 08/13/2004   ORIF of left body fracture  mandible with KLS Martin 2.3-mm six hole/notes 03/08/2011  . RIGHT/LEFT HEART CATH AND CORONARY/GRAFT ANGIOGRAPHY N/A 12/19/2019   Procedure: RIGHT/LEFT HEART CATH AND CORONARY/GRAFT ANGIOGRAPHY;  Surgeon: Jettie Booze, MD;  Location: [facility] CV LAB;  Service: Cardiovascular;  Laterality: N/A;  . SCLEROTHERAPY  12/22/2019   Procedure: SCLEROTHERAPY;  Surgeon: Lavena Bullion, DO;  Location: [facility] ENDOSCOPY;  Service: Gastroenterology;;       Family History  Problem Relation Age of Onset  . Heart failure Father   . Hypertension Father   . Diabetes Brother   . Heart attack Brother   . Alzheimer's disease Mother   . Stroke Sister   . Diabetes Sister   . Alzheimer's disease Sister   . Hypertension Brother   . Diabetes Brother   . Drug abuse Brother   . Colon cancer Neg Hx     Social History   Tobacco Use  . Smoking status: Current Every Day Smoker    Packs/day: 0.50     Years: 43.00    Pack years: 21.50    Types: Cigarettes  . Smokeless tobacco: Never Used  Vaping Use  . Vaping Use: Never used  Substance Use Topics  . Alcohol use: Not Currently    Alcohol/week: 12.0 standard drinks    Types: 12 Standard drinks or equivalent per week  . Drug use: Not Currently    Types: Cocaine    Comment: hx of crack/cocaine 71yrs ago 10/01/2019- none    Home Medications Prior to Admission medications   Medication Sig Start Date End Date Taking? Authorizing Provider  acetaminophen (TYLENOL) 650 MG CR tablet Take 650-1,300 mg by mouth 3 (three) times daily.    [provider]  allopurinol (ZYLOPRIM) 100 MG tablet TAKE 1 TABLET(100 MG) BY MOUTH DAILY Patient taking differently: Take 100 mg by mouth daily. 07/23/20   [name], NP  ANORO ELLIPTA 62.5-25 MCG/INH AEPB INHALE 1 PUFF BY MOUTH EVERY DAY Patient taking differently: Inhale 1 puff into the lungs daily. 12/21/20   [name], NP  B-D UF III MINI PEN NEEDLES 31G X 5 MM MISC USE FOUR TIMES DAILY 03/02/20   [name], NP  bictegravir-emtricitabine-tenofovir AF (BIKTARVY) 50-200-25 MG TABS tablet Take 1 tablet by mouth daily. 06/30/20   [name], MD  carvedilol (COREG) 3.125 MG tablet Take 1 tablet (3.125 mg total) by mouth 2 (two) times daily. 02/13/20   [name], MD  Cinacalcet HCl (SENSIPAR PO) Take 1 tablet by mouth 3 (three) times a week. At dialysis 08/14/20 08/13/21  [provider]  colchicine 0.6 MG tablet Take 0.6-1.2 mg by mouth daily as needed (gout flare-up).    [provider]  diclofenac Sodium (VOLTAREN) 1 % GEL APPLY 2 GRAMS TO THE AFFECTED AREA THREE TIMES DAILY AS NEEDED FOR PAIN Patient taking differently: Apply 2 g topically 3 (three) times daily as needed (pain). 11/16/20   [name], NP  folic acid (FOLVITE) 1 MG tablet Take 1 tablet (1 mg total) by mouth daily. 11/30/20   [name], NP  gabapentin (NEURONTIN) 300 MG  capsule Take 1 capsule (300 mg total) by mouth daily. Dose change due to renal function 12/09/20   [name], NP  insulin lispro (HUMALOG KWIKPEN) 100 UNIT/ML KwikPen Inject 0.95ml (20 Units total) into the skin 3 (three) times daily as needed for high blood sugar (above 150) Patient taking differently: Inject 0.51ml (20 Units total) into the skin 3 (three)  times daily as needed for high blood sugar (above 150) 03/12/20   [name], NP  iron sucrose in sodium chloride 0.9 % 100 mL Iron Sucrose (Venofer) 07/08/20 07/07/21  [provider]  isosorbide mononitrate (IMDUR) 30 MG 24 hr tablet TAKE 1 TABLET BY MOUTH EVERY DAY Patient taking differently: Take 30 mg by mouth daily. 08/10/20   [name], NP  Lancets [facility] DELICA PLUS [id number]) MISC Inject 1 Device as directed 3 (three) times daily. Dx: E11.40 09/18/19   [name], NP  latanoprost (XALATAN) 0.005 % ophthalmic solution Place 1 drop into both eyes at bedtime.    [provider]  Methoxy PEG-Epoetin Beta (MIRCERA IJ) Mircera 12/25/20   [provider]  multivitamin (RENA-VIT) TABS tablet Take 1 tablet by mouth daily.    [provider]  nitroGLYCERIN (NITROSTAT) 0.3 MG SL tablet ONE TABLET UNDER TONGUE AS NEEDED FOR CHEST PAIN Patient taking differently: Place 0.3 mg under the tongue every 5 (five) minutes as needed for chest pain. 05/01/19   [name], MD  Nutritional Supplements (VITAMIN D BOOSTER PO) Take 1 each by mouth 2 (two) times daily. 08/12/20 08/11/21  [provider]  ONETOUCH ULTRA test strip CHECK BLOOD SUGAR 3 TIMES  DAILY Patient taking differently: 1 each by Other route 3 (three) times daily. 05/27/20   [name], NP  pantoprazole (PROTONIX) 40 MG tablet TAKE 1 TABLET(40 MG) BY MOUTH TWICE DAILY Patient taking differently: Take 40 mg by mouth 2 (two) times daily. 10/19/20   Fayrene Helper, MD  polyethylene glycol (MIRALAX / GLYCOLAX) 17  g packet Take 17 g by mouth daily as needed for mild constipation. 11/07/20   Aline August, MD  sevelamer carbonate (RENVELA) 800 MG tablet Take 800 mg by mouth 3 (three) times daily with meals.    [provider]  timolol (TIMOPTIC) 0.5 % ophthalmic solution Place 1 drop into both eyes daily. 04/04/19   [provider]  traMADol (ULTRAM) 50 MG tablet Take 1 tablet (50 mg total) by mouth 2 (two) times daily as needed. 11/20/20   Marybelle Killings, MD    Allergies    Augmentin [amoxicillin-pot clavulanate], Mucinex fast-max, and Amphetamines  Review of Systems   Review of Systems  Gastrointestinal: Positive for hematochezia. Negative for abdominal pain.  All other systems reviewed and are negative.   Physical Exam Updated Vital Signs BP 123/60 (BP Location: Right Arm)   Pulse 93   Temp 98.5 F (36.9 C)   Resp 18   SpO2 99%   Physical Exam Vitals and nursing note reviewed.  Constitutional:      Appearance: He is well-developed.  HENT:     Head: Normocephalic and atraumatic.  Eyes:     Conjunctiva/sclera: Conjunctivae normal.  Cardiovascular:     Rate and Rhythm: Normal rate and regular rhythm.     Heart sounds: No murmur heard.   Pulmonary:     Effort: Pulmonary effort is normal. No respiratory distress.     Breath sounds: Normal breath sounds.  Abdominal:     Palpations: Abdomen is soft.     Tenderness: There is no abdominal tenderness.  Genitourinary:    Rectum: Guaiac result positive.  Musculoskeletal:     Cervical back: Neck supple.  Skin:    General: Skin is warm and dry.  Neurological:     Mental Status: He is alert.  Psychiatric:        Mood and Affect:  Mood normal.     ED Results / Procedures / Treatments   Labs (all labs ordered are listed, but only abnormal results are displayed) Labs Reviewed  CBC - Abnormal; Notable for the following components:      Result Value   RBC 2.64 (*)    Hemoglobin 8.0 (*)    HCT 26.2 (*)    RDW 15.9  (*)    Platelets 146 (*)    All other components within normal limits  COMPREHENSIVE METABOLIC PANEL - Abnormal; Notable for the following components:   Chloride 95 (*)    Glucose, Bld 128 (*)    BUN 64 (*)    Creatinine, Ser 5.19 (*)    Albumin 3.1 (*)    GFR, Estimated 11 (*)    All other components within normal limits  POC OCCULT BLOOD, ED - Abnormal; Notable for the following components:   Fecal Occult Bld POSITIVE (*)    All other components within normal limits    EKG None  Radiology No results found.  Procedures Procedures   Medications Ordered in ED Medications - No data to display  ED Course  I have reviewed the triage vital signs and the nursing notes.  Pertinent labs & imaging results that were available during my care of the patient were reviewed by me and considered in my medical decision making (see chart for details).  Clinical Course as of 01/07/21 1435  Thu Jan 07, 2021  1433 CBC(!) [LS]    Clinical Course User Index [LS] Fransico Meadow, PA-C   MDM Rules/Calculators/A&P                          MDM:  Pt has dark stool.  Hemoglobin today is 8.  This is improved from previous hemoglobin.  Pt had an endoscopy on 3/1 with hemostatic clips  I spoke with Gi Nancy Marus who will see and consult. Hospitalist consulted for admission   Final Clinical Impression(s) / ED Diagnoses Final diagnoses:  Chronic GI bleeding  Chronic anemia    Rx / DC Orders ED Discharge Orders    None       Sidney Ace 01/07/21 1512    Isla Pence, MD 01/07/21 1551

## 2021-01-07 NOTE — H&P (Signed)
History and Physical:    Jacob [name]   FFM:[id number] DOB: [birth year] DOA: 01/07/2021  Referring MD/provider: Dr.Curatolo PCP: [name], NP   Patient coming from: Home  Chief Complaint: Melena  History of Present Illness:   Jacob [name] is an 70 y.o. male with PMH significant for ESRD on HD MWF, HIV, COPD, DM 2, CAD on Brilinta and history of recurrent GI bleed secondary to AVM who presents with melena.  Of note patient was recently treated in house 2 weeks ago for bloody stools.  He had signed out [place] however had returned after several more stools.  Patient had AVMs cauterized in December 2021 however his need for Brilinta for his CAD complicated issues of GI bleeding.  EGD done on 3 1 showed duodenal bulb nodule with steady nonpulsatile bleeding and was treated with APC and clipping x4.  He had needed 5 units of PRBCs during that time.  Patient was discharged home on Protonix with Brilinta being held for 5 days.  Patient states that he did okay until yesterday when melena recurred.  He was hoping that it would go away but he had several more episodes of black stools overnight so decided to come into the ED.  Patient denies any chest pain or shortness of breath.  He is just annoyed that he has to be in the hospital.  He states he does not like the food here.  ED Course:  The patient was noted to be hemodynamically stable.  With hemoglobin of 8.0, increased from 7.1 on discharge 12/24/2020. Plan was initially to discharge patient home as he was hemodynamically stable without need for transfusion however low [facility] who follows the patient requested admission for endoscopy, possible capsule endoscopy as warranted.   ROS:   ROS   Review of Systems: As per HPI  Past Medical History:   Past Medical History:  Diagnosis Date  . Acute respiratory failure ([place]) 03/01/2018  . Arthritis    "all over; mostly knees and back" (02/28/2018)  . Chronic lower  back pain    stenosis  . Community acquired pneumonia 09/06/2013  . COPD (chronic obstructive pulmonary disease) ([place])   . Coronary atherosclerosis of native coronary artery    a. 02/2003 s/p CABG x 2 (VG->RI, VG->RPDA; b. 11/2019 PCI: LM nl, LAD 90d, D3 50, RI 100, LCX 100p, OM3 100 - fills via L->L collats from D2/dLAD, RCA 100p, VG->RPDA ok, VG->RI 95 (3.5x48 Synergy XD DES).  . Drug abuse ([place])    hx; tested for cocaine as recently as 2/08. says he is not using drugs now - avoided defib. for this reason   . ESRD (end stage renal disease) ([place])    Hemo M-W-F- Richarda Blade  . Fall at home 10/2020  . GERD (gastroesophageal reflux disease)    takes OTC meds as needed  . GI bleeding    a. 11/2019 EGD: angiodysplastic lesions w/ bleeding s/p argon plasma/clipping/epi inj.  . Glaucoma    uses eye drops daily  . Hepatitis B 1968   "tx'd w/isolation; caught it from toilet stools in gym"  . HFrEF (heart failure with reduced ejection fraction) ([place])    a. 01/2019 Echo: EF 40-45%, diffuse HK, mild basal septal hypertrophy.  . History of blood transfusion 03/01/2019  . History of colon polyps    benign  . History of gout    takes Allopurinol daily as well as Colchicine-if needed (02/28/2018)  . History of kidney  stones   . HTN (hypertension)    takes Coreg,Imdur.and Apresoline daily  . Human immunodeficiency virus (HIV) disease ([employer]) dx'd 89   on [place] as of 12/2020.    [name] Hyperlipidemia    takes Atorvastatin daily  . Ischemic cardiomyopathy    a. 01/2019 Echo: EF 40-45%, diffuse HK, mild basal septal hypertrophy. Diast dysfxn. Nl RV size/fxn. Sev dil LA. Triv MR/TR/PR.  [name] Muscle spasm    takes Zanaflex as needed  . Myocardial infarction ([place]) ~ 2004/2005  . Nocturia   . Peripheral neuropathy    takes gabapentin daily  . Pneumonia    "at least twice" (02/28/2018)  . Syphilis, unspecified   . Type II diabetes mellitus ([place]) 2004   Lantus daily.Average fasting blood sugar 125-199  . Wears  glasses   . Wears partial dentures     Past Surgical History:   Past Surgical History:  Procedure Laterality Date  . AV FISTULA PLACEMENT Left 08/02/2018   Procedure: ARTERIOVENOUS (AV) FISTULA CREATION  left arm radiocephlic;  Surgeon: Marty Heck, MD;  Location: [place];  Service: Vascular;  Laterality: Left;  . AV FISTULA PLACEMENT Left 08/01/2019   Procedure: LEFT BRACHIOCEPHALIC ARTERIOVENOUS (AV) FISTULA CREATION;  Surgeon: [name], MD;  Location: [place];  Service: Vascular;  Laterality: Left;  . BASCILIC VEIN TRANSPOSITION Left 10/03/2019   Procedure: BASILIC VEIN TRANSPOSITION LEFT SECOND STAGE;  Surgeon: [name], MD;  Location: [place];  Service: Vascular;  Laterality: Left;  . CARDIAC CATHETERIZATION  10/2002; 12/19/2004   [name] 03/08/2011  . COLONOSCOPY  2013   Dr.John Henrene Pastor   . CORONARY ARTERY BYPASS GRAFT  02/24/2003   CABG X2/notes 03/08/2011  . CORONARY STENT INTERVENTION N/A 12/19/2019   Procedure: CORONARY STENT INTERVENTION;  Surgeon: Jettie Booze, MD;  Location: [place] [facility];  Service: Cardiovascular;  Laterality: N/A;  . ESOPHAGOGASTRODUODENOSCOPY (EGD) WITH PROPOFOL N/A 02/08/2019   Procedure: ESOPHAGOGASTRODUODENOSCOPY (EGD) WITH PROPOFOL;  Surgeon: Milus Banister, MD;  Location: [place];  Service: Gastroenterology;  Laterality: N/A;  . ESOPHAGOGASTRODUODENOSCOPY (EGD) WITH PROPOFOL N/A 12/22/2019   Procedure: ESOPHAGOGASTRODUODENOSCOPY (EGD) WITH PROPOFOL;  Surgeon: Lavena Bullion, DO;  Location: [place];  Service: Gastroenterology;  Laterality: N/A;  . ESOPHAGOGASTRODUODENOSCOPY (EGD) WITH PROPOFOL N/A 10/19/2020   Procedure: ESOPHAGOGASTRODUODENOSCOPY (EGD) WITH PROPOFOL;  Surgeon: Jackquline Denmark, MD;  Location[facility] ENDOSCOPY;  Service: Endoscopy;  Laterality: N/A;  . ESOPHAGOGASTRODUODENOSCOPY (EGD) WITH PROPOFOL N/A 12/22/2020   Procedure: ESOPHAGOGASTRODUODENOSCOPY (EGD) WITH PROPOFOL;  Surgeon: Gatha Mayer, MD;   Location: [place];  Service: Endoscopy;  Laterality: N/A;  . HEMOSTASIS CLIP PLACEMENT  12/22/2019   Procedure: HEMOSTASIS CLIP PLACEMENT;  Surgeon: Lavena Bullion, DO;  Location: [place] ENDOSCOPY;  Service: Gastroenterology;;  . HEMOSTASIS CLIP PLACEMENT  12/22/2020   Procedure: HEMOSTASIS CLIP PLACEMENT;  Surgeon: Gatha Mayer, MD;  Location: [facility] ENDOSCOPY;  Service: Endoscopy;;  . HEMOSTASIS CONTROL  12/22/2020   Procedure: HEMOSTASIS CONTROL/hemospray;  Surgeon: Gatha Mayer, MD;  Location: [place];  Service: Endoscopy;;  . HOT HEMOSTASIS N/A 02/08/2019   Procedure: HOT HEMOSTASIS (ARGON PLASMA COAGULATION/BICAP);  Surgeon: Milus Banister, MD;  Location: [facility] ENDOSCOPY;  Service: Gastroenterology;  Laterality: N/A;  . HOT HEMOSTASIS N/A 12/22/2019   Procedure: HOT HEMOSTASIS (ARGON PLASMA COAGULATION/BICAP);  Surgeon: Lavena Bullion, DO;  Location: [facility]COPY;  Service: Gastroenterology;  Laterality: N/A;  . HOT HEMOSTASIS N/A 10/19/2020   Procedure: HOT HEMOSTASIS (ARGON PLASMA COAGULATION/BICAP);  Surgeon: Jackquline Denmark,  MD;  Location: [place] ENDOSCOPY;  Service: Endoscopy;  Laterality: N/A;  . HOT HEMOSTASIS N/A 12/22/2020   Procedure: HOT HEMOSTASIS (ARGON PLASMA COAGULATION/BICAP);  Surgeon: Gatha Mayer, MD;  Location[name] END[facility];  Service: Endoscopy;  Laterality: N/A;  . INTERTROCHANTERIC HIP FRACTURE SURGERY Left 11/2006   [name] 03/08/2011  . INTRAVASCULAR ULTRASOUND/IVUS N/A 12/19/2019   Procedure: Intravascular Ultrasound/IVUS;  Surgeon: Jettie Booze, MD;  Location: [place] CV LAB;  Service: Cardiovascular;  Laterality: N/A;  . IR FLUORO GUIDE CV LINE RIGHT  07/24/2019  . IR FLUORO GUIDE CV LINE RIGHT  07/30/2019  . IR US GUIDE VASC ACCESS RIGHT  07/24/2019  . IR US GUIDE VASC ACCESS RIGHT  07/30/2019  . LAPAROSCOPIC CHOLECYSTECTOMY  05/2006  . LIGATION OF COMPETING BRANCHES OF ARTERIOVENOUS FISTULA Left 11/05/2018   Procedure: LIGATION OF COMPETING BRANCHES  OF ARTERIOVENOUS FISTULA  LEFT  ARM;  Surgeon: Marty Heck, MD;  Location: [place];  Service: Vascular;  Laterality: Left;  . LUMBAR LAMINECTOMY/DECOMPRESSION MICRODISCECTOMY N/A 02/29/2016   Procedure: Left L4-5 Lateral Recess Decompression, Removal Extradural Intraspinal Facet Cyst;  Surgeon: Marybelle Killings, MD;  Location: [place];  Service: Orthopedics;  Laterality: N/A;  . MULTIPLE TOOTH EXTRACTIONS    . ORIF MANDIBULAR FRACTURE Left 08/13/2004   ORIF of left body fracture mandible with KLS Martin 2.3-mm six hole/notes 03/08/2011  . RIGHT/LEFT HEART CATH AND CORONARY/GRAFT ANGIOGRAPHY N/A 12/19/2019   Procedure: RIGHT/LEFT HEART CATH AND CORONARY/GRAFT ANGIOGRAPHY;  Surgeon: Jettie Booze, MD;  Location: [place] CV LAB;  Service: Cardiovascular;  Laterality: N/A;  . SCLEROTHERAPY  12/22/2019   Procedure: SCLEROTHERAPY;  Surgeon: Lavena Bullion, DO;  Location: [facility] ENDOSCOPY;  Service: Gastroenterology;;    Social History:   Social History   Socioeconomic History  . Marital status: Single    Spouse name: Not on file  . Number of children: 1  . Years of education: Not on file  . Highest education level: Not on file  Occupational History  . Occupation: retired  Tobacco Use  . Smoking status: Current Every Day Smoker    Packs/day: 0.50    Years: 43.00    Pack years: 21.50    Types: Cigarettes  . Smokeless tobacco: Never Used  Vaping Use  . Vaping Use: Never used  Substance and Sexual Activity  . Alcohol use: Not Currently    Alcohol/week: 12.0 standard drinks    Types: 12 Standard drinks or equivalent per week  . Drug use: Not Currently    Types: Cocaine    Comment: hx of crack/cocaine 19yrs ago 10/01/2019- none  . Sexual activity: Yes    Partners: Male  Other Topics Concern  . Not on file  Social History Narrative   Retired.       As of 05/2015:   Diet: No salt    Caffeine   Married: Single    House: Condo, 2 stories, 1 person (self)    Pets: No     Current/Past profession: N/A   Exercise: walks daily    Living Will: Yes    DNR   POA/HPOA: No       Social Determinants of Health   Financial Resource Strain: Low Risk   . Difficulty of Paying Living Expenses: Not hard at all  Food Insecurity: No Food Insecurity  . Worried About Charity fundraiser in the Last Year: Never true  . Ran Out of Food in the Last Year: Never true  Transportation Needs:  No Transportation Needs  . Lack of Transportation (Medical): No  . Lack of Transportation (Non-Medical): No  Physical Activity: Not on file  Stress: Not on file  Social Connections: Not on file  Intimate Partner Violence: Not on file    Allergies   Augmentin [amoxicillin-pot clavulanate], Mucinex fast-max, and Amphetamines  Family history:   Family History  Problem Relation Age of Onset  . Heart failure Father   . Hypertension Father   . Diabetes Brother   . Heart attack Brother   . Alzheimer's disease Mother   . Stroke Sister   . Diabetes Sister   . Alzheimer's disease Sister   . Hypertension Brother   . Diabetes Brother   . Drug abuse Brother   . Colon cancer Neg Hx     Current Medications:   Prior to Admission medications   Medication Sig Start Date End Date Taking? Authorizing Provider  acetaminophen (TYLENOL) 650 MG CR tablet Take 650-1,300 mg by mouth 3 (three) times daily.   Yes [provider]  allopurinol (ZYLOPRIM) 100 MG tablet TAKE 1 TABLET(100 MG) BY MOUTH DAILY Patient taking differently: Take 100 mg by mouth daily. 07/23/20  Yes Eubanks, [name], NP  ANORO ELLIPTA 62.5-25 MCG/INH AEPB INHALE 1 PUFF BY MOUTH EVERY DAY Patient taking differently: Inhale 1 puff into the lungs daily. 12/21/20  Yes [name], NP  B-D UF III MINI PEN NEEDLES 31G X 5 MM MISC USE FOUR TIMES DAILY 03/02/20  Yes [name], NP  bictegravir-emtricitabine-tenofovir AF (BIKTARVY) 50-200-25 MG TABS tablet Take 1 tablet by mouth daily. 06/30/20  Yes [name], MD  carvedilol (COREG) 3.125 MG tablet Take 1 tablet (3.125 mg total) by mouth 2 (two) times daily. 02/13/20  Yes [name], MD  Cinacalcet HCl (SENSIPAR PO) Take 1 tablet by mouth 3 (three) times a week. At dialysis 08/14/20 08/13/21 Yes [provider]  colchicine 0.6 MG tablet Take 0.6-1.2 mg by mouth daily as needed (gout flare-up).   Yes [provider]  diclofenac Sodium (VOLTAREN) 1 % GEL APPLY 2 GRAMS TO THE AFFECTED AREA THREE TIMES DAILY AS NEEDED FOR PAIN Patient taking differently: Apply 2 g topically 3 (three) times daily as needed (pain). 11/16/20  Yes [name], NP  folic acid (FOLVITE) 1 MG tablet Take 1 tablet (1 mg total) by mouth daily. 11/30/20  Yes [name], NP  gabapentin (NEURONTIN) 300 MG capsule Take 1 capsule (300 mg total) by mouth daily. Dose change due to renal function 12/09/20  Yes Eubanks, [name], NP  insulin lispro (HUMALOG KWIKPEN) 100 UNIT/ML KwikPen Inject 0.56ml (20 Units total) into the skin 3 (three) times daily as needed for high blood sugar (above 150) Patient taking differently: Inject 0.65ml (20 Units total) into the skin 3 (three) times daily as needed for high blood sugar (above 150) 03/12/20  Yes [name], NP  iron sucrose in sodium chloride 0.9 % 100 mL Iron Sucrose (Venofer) 07/08/20 07/07/21 Yes [provider]  isosorbide mononitrate (IMDUR) 30 MG 24 hr tablet TAKE 1 TABLET BY MOUTH EVERY DAY Patient taking differently: Take 30 mg by mouth daily. 08/10/20  Yes [name], NP  Lancets (ONETOUCH DELICA PLUS [id number]) MISC Inject 1 Device as directed 3 (three) times daily. Dx: E11.40 09/18/19  Yes [name], NP  latanoprost (XALATAN) 0.005 % ophthalmic solution Place 1 drop into both eyes at bedtime.   Yes [provider]  Methoxy  PEG-Epoetin Beta (MIRCERA IJ) Mircera 12/25/20  Yes [provider]  multivitamin (RENA-VIT) TABS tablet Take 1 tablet by mouth  daily.   Yes [provider]  nitroGLYCERIN (NITROSTAT) 0.3 MG SL tablet ONE TABLET UNDER TONGUE AS NEEDED FOR CHEST PAIN Patient taking differently: Place 0.3 mg under the tongue every 5 (five) minutes as needed for chest pain. 05/01/19  Yes [name], MD  ONETOUCH ULTRA test strip CHECK BLOOD SUGAR 3 TIMES  DAILY Patient taking differently: 1 each by Other route 3 (three) times daily. 05/27/20  Yes [name], NP  pantoprazole (PROTONIX) 40 MG tablet TAKE 1 TABLET(40 MG) BY MOUTH TWICE DAILY Patient taking differently: Take 40 mg by mouth 2 (two) times daily. 10/19/20  Yes Fayrene Helper, MD  polyethylene glycol (MIRALAX / GLYCOLAX) 17 g packet Take 17 g by mouth daily as needed for mild constipation. 11/07/20  Yes Aline August, MD  sevelamer carbonate (RENVELA) 800 MG tablet Take 2,400 mg by mouth daily.   Yes [provider]  timolol (TIMOPTIC) 0.5 % ophthalmic solution Place 1 drop into both eyes daily. 04/04/19  Yes [provider]  traMADol (ULTRAM) 50 MG tablet Take 1 tablet (50 mg total) by mouth 2 (two) times daily as needed. 11/20/20  Yes Marybelle Killings, MD    Physical Exam:   Vitals:   01/07/21 1032 01/07/21 1430 01/07/21 1831  BP: 123/60 132/64 134/69  Pulse: 93 89 93  Resp: 18 16 19   Temp: 98.5 F (36.9 C)  98.3 F (36.8 C)  SpO2: 99% 98% 100%     Physical Exam: Blood pressure 134/69, pulse 93, temperature 98.3 F (36.8 C), resp. rate 19, SpO2 100 %. Gen: Irritable gentleman lying in bed in no distress requesting food. Eyes: sclera anicteric, conjuctiva mildly injected bilaterally CVS: S1-S2, regulary, no gallops Respiratory:  decreased air entry likely secondary to decreased inspiratory effort GI: Protuberant abdomen that is soft and nontender.  Normoactive bowel sounds. LE: No edema. No cyanosis Neuro: grossly nonfocal.    Data Review:    Labs: Basic Metabolic Panel: Recent Labs  Lab 01/07/21 1044  NA 136  K 4.3   CL 95*  CO2 30  GLUCOSE 128*  BUN 64*  CREATININE 5.19*  CALCIUM 9.3   Liver Function Tests: Recent Labs  Lab 01/07/21 1044  AST 21  ALT 13  ALKPHOS 86  BILITOT 0.6  PROT 6.5  ALBUMIN 3.1*   No results for input(s): LIPASE, AMYLASE in the last 168 hours. No results for input(s): AMMONIA in the last 168 hours. CBC: Recent Labs  Lab 01/07/21 1044  WBC 5.7  HGB 8.0*  HCT 26.2*  MCV 99.2  PLT 146*   Cardiac Enzymes: No results for input(s): CKTOTAL, CKMB, CKMBINDEX, TROPONINI in the last 168 hours.  BNP (last 3 results) No results for input(s): PROBNP in the last 8760 hours. CBG: No results for input(s): GLUCAP in the last 168 hours.  Urinalysis    Component Value Date/Time   COLORURINE YELLOW 10/16/2020 2136   APPEARANCEUR CLEAR 10/16/2020 2136   LABSPEC 1.016 10/16/2020 2136   PHURINE 5.0 10/16/2020 2136   GLUCOSEU NEGATIVE 10/16/2020 2136   GLUCOSEU NEG mg/dL 09/20/2010 2058   HGBUR SMALL (A) 10/16/2020 2136   HGBUR negative 05/31/2010 0948   BILIRUBINUR NEGATIVE 10/16/2020 2136   [place] NEGATIVE 10/16/2020 2136   PROTEINUR 30 (A) 10/16/2020 2136   UROBILINOGEN 0.2 06/24/2015 2336   NITRITE NEGATIVE 10/16/2020 2136  LEUKOCYTESUR NEGATIVE 10/16/2020 2136      Radiographic Studies: No results found.  EKG: Independently reviewed.  Ordered and pending   Assessment/Plan:   Principal Problem:   Melena Active Problems:   Human immunodeficiency virus (HIV) disease (HCC)   Chronic combined systolic (congestive) and diastolic (congestive) heart failure (HCC)   HTN (hypertension)   DM (diabetes mellitus) (HCC)   Type 2 diabetes mellitus with diabetic polyneuropathy, with long-term current use of insulin (HCC)   CHF (congestive heart failure) (HCC)   Chronic obstructive pulmonary disease (HCC)   Acute blood loss anemia   AVM (arteriovenous malformation) of duodenum, acquired  70 year old man with ESRD on HD, HIV is admitted with recurrent  melena.  He is known to have multiple AVMs.  GI bleed/melena Patient is hemodynamically stable and H&H is actually improved since discharge 2 weeks ago.  However [facility] wanted the patient admitted for possible capsule endoscopy, they have been consulted and are aware of the patient. Continue pantoprazole  ESRD Discussed with nephrology Dr. Candiss Norse, patient will need routine HD in the morning  HIV Continue Biktarvy for home doses  DM2 Patient apparently uses lispro 20 units 3 times daily as needed for blood sugar above 150 We will hold this in place patient on sensitive dose SSI AC at bedtime  HTN Continue carvedilol  CAD Continue carvedilol, Imdur Of note patient does not appear to be on Brilinta given ongoing GI bleed Patient denies chest pain  CHF No evidence of fluid overload Continue carvedilol Fluid management per hemodialysis  Gout Continue colchicine and allopurinol per home doses  COPD No evidence for acute flare, continue Anoro Ellipta  Glaucoma Continue latanoprost and timolol  Other information:   DVT prophylaxis: Subcu heparin ordered. Code Status: Full Family Communication: Patient was in communication with his daughter Disposition Plan: Home Consults called: Low Hinton Rao, nephrology Admission status: Observation  Javonna Balli Tublu Galdino Hinchman [facility]  If 7PM-7AM, please contact night-coverage [email or web address] Password [facility] 01/07/2021, 6:38 PM

## 2021-01-07 NOTE — Telephone Encounter (Signed)
Patient called and stated that he has had Black Stools since yesterday. Stated that Dialysis told him to cancel his Cardiologist appointment for today and go on to the ER to be evaluated. Patient canceled appointment with Cardiologist and called to inform us he was going to the ER.   FYI

## 2021-01-07 NOTE — Progress Notes (Signed)
Pt refused blood pressure medication. On call provider, Ander Slade, made aware via text page. RN education patient.

## 2021-01-07 NOTE — Consult Note (Addendum)
[facility] Consult: 3:26 PM 01/07/2021  LOS: 0 days    Referring Provider: ED Dr. Gilford Raid. Primary Care Physician:  [name], NP Primary Gastroenterologist:  Dr. Henrene Pastor     Reason for Consultation: Dark stools.  Anemia.   HPI: Jacob [name] is a 70 y.o. male.  PMH Diastolic, systolic heart failure.  CABG 2004.  CAD.  Drug-eluting stent placed February 2021.  End-stage renal disease with hemodialysis MWF.  HIV.  COPD. 09/2012 colonoscopy for cancer screening.  Tiny polyp removed but too small to retrieve and not sent for pathology at ascending colon.  Sigmoid diverticulosis.  Dr. Blanch Media recommendation was for repeat colonoscopy in 10 years.   Patient with recurrent issues of heme positive stools, hematochezia, melena, transfusion requiring anemia 01/2019 EGD.  APC destroyed a few small AVMs in the stomach and duodenal bulb.  Mild duodenitis. 11/2019 EGD.  4 AVMs, 2 actively bleeding all of these areas were treated with a combination of APC, endoclips and epinephrine. 10/19/2020 EGD for melena.  Gastric AVMs treated with APC. 12/22/2020 EGD for melena.  There was blood in the duodenal bulb.  A nodule at the duodenal bulb with steady, nonpulsatile bleeding at the base close to the distal aspect of pylorus so question if this was an AVM.  Area was treated with APC and endoclips, hemospray.  No bleeding at conclusion of procedure.  Received blood during hospitalization earlier this month for anemia. Asymptomatic COVID-19 positive infection in early February. Receives Venofer with dialysis periodically At discharge on 3/3 he was advised to restart taking Brilinta on March 8.  After he returned home from this recent admission his stools were brown.  He was eating well.  No abdominal pain no weakness, no  shortness of breath.  He resumed his Brilinta.  Last evening after hemodialysis he wiped and saw black stool again. Return to the ED early this morning and while waiting in the waiting area for several hours had another black stool which had red blood tinge to it. No shortness of breath. Continues to consume alcohol on a daily basis.  During admission Hgb nadir 6..  MCV high 90s. Hgb 8.  This compares with 7.1 on 3/3. Platelets 146.  Compares with 108 on March 2.  Cardiology evaluated the patient today as well.  Feel he is having demand ischemia.  They feel it is safe to hold Brilinta but recommend continuing aspirin if GI okay with this.   Family Hx: Alzheimer's disease in his mother and sister; Diabetes in his brother, brother, and sister; Drug abuse in his brother; Heart attack in his brother; Heart failure in his father; Hypertension in his brother and father; Stroke in his sister.  Past Medical History:  Diagnosis Date  . Acute respiratory failure ([place]) 03/01/2018  . Arthritis    "all over; mostly knees and back" (02/28/2018)  . Chronic lower back pain    stenosis  . Community acquired pneumonia 09/06/2013  . COPD (chronic obstructive pulmonary disease) ([place])   . Coronary atherosclerosis of native coronary artery  a. 02/2003 s/p CABG x 2 (VG->RI, VG->RPDA; b. 11/2019 PCI: LM nl, LAD 90d, D3 50, RI 100, LCX 100p, OM3 100 - fills via L->L collats from D2/dLAD, RCA 100p, VG->RPDA ok, VG->RI 95 (3.5x48 Synergy XD DES).  . Drug abuse ([place])    hx; tested for cocaine as recently as 2/08. says he is not using drugs now - avoided defib. for this reason   . ESRD (end stage renal disease) ([place])    Hemo M-W-F- Richarda Blade  . Fall at home 10/2020  . GERD (gastroesophageal reflux disease)    takes OTC meds as needed  . GI bleeding    a. 11/2019 EGD: angiodysplastic lesions w/ bleeding s/p argon plasma/clipping/epi inj.  . Glaucoma    uses eye drops daily  . Hepatitis B 1968   "tx'd w/isolation;  caught it from toilet stools in gym"  . HFrEF (heart failure with reduced ejection fraction) ([place])    a. 01/2019 Echo: EF 40-45%, diffuse HK, mild basal septal hypertrophy.  . History of blood transfusion 03/01/2019  . History of colon polyps    benign  . History of gout    takes Allopurinol daily as well as Colchicine-if needed (02/28/2018)  . History of kidney stones   . HTN (hypertension)    takes Coreg,Imdur.and Apresoline daily  . Human immunodeficiency virus (HIV) disease ([place]) dx'd 56   on [place] as of 12/2020.    [name] Hyperlipidemia    takes Atorvastatin daily  . Ischemic cardiomyopathy    a. 01/2019 Echo: EF 40-45%, diffuse HK, mild basal septal hypertrophy. Diast dysfxn. Nl RV size/fxn. Sev dil LA. Triv MR/TR/PR.  [name] Muscle spasm    takes Zanaflex as needed  . Myocardial infarction ([place]) ~ 2004/2005  . Nocturia   . Peripheral neuropathy    takes gabapentin daily  . Pneumonia    "at least twice" (02/28/2018)  . Syphilis, unspecified   . Type II diabetes mellitus ([place]) 2004   Lantus daily.Average fasting blood sugar 125-199  . Wears glasses   . Wears partial dentures     Past Surgical History:  Procedure Laterality Date  . AV FISTULA PLACEMENT Left 08/02/2018   Procedure: ARTERIOVENOUS (AV) FISTULA CREATION  left arm radiocephlic;  Surgeon: Marty Heck, MD;  Location: [place];  Service: Vascular;  Laterality: Left;  . AV FISTULA PLACEMENT Left 08/01/2019   Procedure: LEFT BRACHIOCEPHALIC ARTERIOVENOUS (AV) FISTULA CREATION;  Surgeon: [name], MD;  Location: [place];  Service: Vascular;  Laterality: Left;  . BASCILIC VEIN TRANSPOSITION Left 10/03/2019   Procedure: BASILIC VEIN TRANSPOSITION LEFT SECOND STAGE;  Surgeon: [name], MD;  Location: [place];  Service: Vascular;  Laterality: Left;  . CARDIAC CATHETERIZATION  10/2002; 12/19/2004   [name] 03/08/2011  . COLONOSCOPY  2013   Dr.John Henrene Pastor   . CORONARY ARTERY BYPASS GRAFT  02/24/2003   CABG X2/notes  03/08/2011  . CORONARY STENT INTERVENTION N/A 12/19/2019   Procedure: CORONARY STENT INTERVENTION;  Surgeon: Jettie Booze, MD;  Location: [place] CV LAB;  Service: Cardiovascular;  Laterality: N/A;  . ESOPHAGOGASTRODUODENOSCOPY (EGD) WITH PROPOFOL N/A 02/08/2019   Procedure: ESOPHAGOGASTRODUODENOSCOPY (EGD) WITH PROPOFOL;  Surgeon: Milus Banister, MD;  Location: [place];  Service: Gastroenterology;  Laterality: N/A;  . ESOPHAGOGASTRODUODENOSCOPY (EGD) WITH PROPOFOL N/A 12/22/2019   Procedure: ESOPHAGOGASTRODUODENOSCOPY (EGD) WITH PROPOFOL;  Surgeon: Lavena Bullion, DO;  Location: [place];  Service: Gastroenterology;  Laterality: N/A;  . ESOPHAGOGASTRODUODENOSCOPY (EGD) WITH PROPOFOL N/A 10/19/2020  Procedure: ESOPHAGOGASTRODUODENOSCOPY (EGD) WITH PROPOFOL;  Surgeon: Jackquline Denmark, MD;  Location: [facility] ENDOSCOPY;  Service: Endoscopy;  Laterality: N/A;  . ESOPHAGOGASTRODUODENOSCOPY (EGD) WITH PROPOFOL N/A 12/22/2020   Procedure: ESOPHAGOGASTRODUODENOSCOPY (EGD) WITH PROPOFOL;  Surgeon: Gatha Mayer, MD;  Location: [place];  Service: Endoscopy;  Laterality: N/A;  . HEMOSTASIS CLIP PLACEMENT  12/22/2019   Procedure: HEMOSTASIS CLIP PLACEMENT;  Surgeon: Lavena Bullion, DO;  Location: [place] ENDOSCOPY;  Service: Gastroenterology;;  . HEMOSTASIS CLIP PLACEMENT  12/22/2020   Procedure: HEMOSTASIS CLIP PLACEMENT;  Surgeon: Gatha Mayer, MD;  Location: [facility] ENDOSCOPY;  Service: Endoscopy;;  . HEMOSTASIS CONTROL  12/22/2020   Procedure: HEMOSTASIS CONTROL/hemospray;  Surgeon: Gatha Mayer, MD;  Location: [place];  Service: Endoscopy;;  . HOT HEMOSTASIS N/A 02/08/2019   Procedure: HOT HEMOSTASIS (ARGON PLASMA COAGULATION/BICAP);  Surgeon: Milus Banister, MD;  Location: [facility] ENDOSCOPY;  Service: Gastroenterology;  Laterality: N/A;  . HOT HEMOSTASIS N/A 12/22/2019   Procedure: HOT HEMOSTASIS (ARGON PLASMA COAGULATION/BICAP);  Surgeon: Lavena Bullion, DO;  Location: [facility]  ENDOSCOPY;  Service: Gastroenterology;  Laterality: N/A;  . HOT HEMOSTASIS N/A 10/19/2020   Procedure: HOT HEMOSTASIS (ARGON PLASMA COAGULATION/BICAP);  Surgeon: Jackquline Denmark, MD;  Location: [facility] ENDOSCOPY;  Service: Endoscopy;  Laterality: N/A;  . HOT HEMOSTASIS N/A 12/22/2020   Procedure: HOT HEMOSTASIS (ARGON PLASMA COAGULATION/BICAP);  Surgeon: Gatha Mayer, MD;  Location: [facility] ENDOSCOPY;  Service: Endoscopy;  Laterality: N/A;  . INTERTROCHANTERIC HIP FRACTURE SURGERY Left 11/2006   [name] 03/08/2011  . INTRAVASCULAR ULTRASOUND/IVUS N/A 12/19/2019   Procedure: Intravascular Ultrasound/IVUS;  Surgeon: Jettie Booze, MD;  Location: [place] CV LAB;  Service: Cardiovascular;  Laterality: N/A;  . IR FLUORO GUIDE CV LINE RIGHT  07/24/2019  . IR FLUORO GUIDE CV LINE RIGHT  07/30/2019  . IR US GUIDE VASC ACCESS RIGHT  07/24/2019  . IR US GUIDE VASC ACCESS RIGHT  07/30/2019  . LAPAROSCOPIC CHOLECYSTECTOMY  05/2006  . LIGATION OF COMPETING BRANCHES OF ARTERIOVENOUS FISTULA Left 11/05/2018   Procedure: LIGATION OF COMPETING BRANCHES OF ARTERIOVENOUS FISTULA  LEFT  ARM;  Surgeon: Marty Heck, MD;  Location: [place];  Service: Vascular;  Laterality: Left;  . LUMBAR LAMINECTOMY/DECOMPRESSION MICRODISCECTOMY N/A 02/29/2016   Procedure: Left L4-5 Lateral Recess Decompression, Removal Extradural Intraspinal Facet Cyst;  Surgeon: Marybelle Killings, MD;  Location: [place];  Service: Orthopedics;  Laterality: N/A;  . MULTIPLE TOOTH EXTRACTIONS    . ORIF MANDIBULAR FRACTURE Left 08/13/2004   ORIF of left body fracture mandible with KLS Martin 2.3-mm six hole/notes 03/08/2011  . RIGHT/LEFT HEART CATH AND CORONARY/GRAFT ANGIOGRAPHY N/A 12/19/2019   Procedure: RIGHT/LEFT HEART CATH AND CORONARY/GRAFT ANGIOGRAPHY;  Surgeon: Jettie Booze, MD;  Location: [place] CV LAB;  Service: Cardiovascular;  Laterality: N/A;  . SCLEROTHERAPY  12/22/2019   Procedure: SCLEROTHERAPY;  Surgeon: Lavena Bullion, DO;   Location: [place] ENDOSCOPY;  Service: Gastroenterology;;    Prior to Admission medications   Medication Sig Start Date End Date Taking? Authorizing Provider  acetaminophen (TYLENOL) 650 MG CR tablet Take 650-1,300 mg by mouth 3 (three) times daily.   Yes [provider]  allopurinol (ZYLOPRIM) 100 MG tablet TAKE 1 TABLET(100 MG) BY MOUTH DAILY Patient taking differently: Take 100 mg by mouth daily. 07/23/20  Yes Eubanks, [name], NP  ANORO ELLIPTA 62.5-25 MCG/INH AEPB INHALE 1 PUFF BY MOUTH EVERY DAY Patient taking differently: Inhale 1 puff into the lungs daily. 12/21/20  Yes [name], NP  B-D UF III MINI PEN NEEDLES 31G X 5 MM MISC USE FOUR TIMES DAILY 03/02/20  Yes [name], NP  bictegravir-emtricitabine-tenofovir AF (BIKTARVY) 50-200-25 MG TABS tablet Take 1 tablet by mouth daily. 06/30/20  Yes [name], MD  carvedilol (COREG) 3.125 MG tablet Take 1 tablet (3.125 mg total) by mouth 2 (two) times daily. 02/13/20  Yes [name], MD  Cinacalcet HCl (SENSIPAR PO) Take 1 tablet by mouth 3 (three) times a week. At dialysis 08/14/20 08/13/21 Yes [provider]  colchicine 0.6 MG tablet Take 0.6-1.2 mg by mouth daily as needed (gout flare-up).   Yes [provider]  diclofenac Sodium (VOLTAREN) 1 % GEL APPLY 2 GRAMS TO THE AFFECTED AREA THREE TIMES DAILY AS NEEDED FOR PAIN Patient taking differently: Apply 2 g topically 3 (three) times daily as needed (pain). 11/16/20  Yes [name], NP  folic acid (FOLVITE) 1 MG tablet Take 1 tablet (1 mg total) by mouth daily. 11/30/20  Yes [name], NP  gabapentin (NEURONTIN) 300 MG capsule Take 1 capsule (300 mg total) by mouth daily. Dose change due to renal function 12/09/20  Yes Eubanks, [name], NP  insulin lispro (HUMALOG KWIKPEN) 100 UNIT/ML KwikPen Inject 0.57ml (20 Units total) into the skin 3 (three) times daily as needed for high blood sugar (above 150) Patient taking differently:  Inject 0.32ml (20 Units total) into the skin 3 (three) times daily as needed for high blood sugar (above 150) 03/12/20  Yes [name], NP  iron sucrose in sodium chloride 0.9 % 100 mL Iron Sucrose (Venofer) 07/08/20 07/07/21 Yes [provider]  isosorbide mononitrate (IMDUR) 30 MG 24 hr tablet TAKE 1 TABLET BY MOUTH EVERY DAY Patient taking differently: Take 30 mg by mouth daily. 08/10/20  Yes [name], NP  Lancets (ONETOUCH DELICA PLUS [id number]) MISC Inject 1 Device as directed 3 (three) times daily. Dx: E11.40 09/18/19  Yes [name], NP  latanoprost (XALATAN) 0.005 % ophthalmic solution Place 1 drop into both eyes at bedtime.   Yes [provider]  Methoxy PEG-Epoetin Beta (MIRCERA IJ) Mircera 12/25/20  Yes [provider]  multivitamin (RENA-VIT) TABS tablet Take 1 tablet by mouth daily.   Yes [provider]  nitroGLYCERIN (NITROSTAT) 0.3 MG SL tablet ONE TABLET UNDER TONGUE AS NEEDED FOR CHEST PAIN Patient taking differently: Place 0.3 mg under the tongue every 5 (five) minutes as needed for chest pain. 05/01/19  Yes [name], MD  ONETOUCH ULTRA test strip CHECK BLOOD SUGAR 3 TIMES  DAILY Patient taking differently: 1 each by Other route 3 (three) times daily. 05/27/20  Yes [name], NP  pantoprazole (PROTONIX) 40 MG tablet TAKE 1 TABLET(40 MG) BY MOUTH TWICE DAILY Patient taking differently: Take 40 mg by mouth 2 (two) times daily. 10/19/20  Yes Fayrene Helper, MD  polyethylene glycol (MIRALAX / GLYCOLAX) 17 g packet Take 17 g by mouth daily as needed for mild constipation. 11/07/20  Yes Aline August, MD  sevelamer carbonate (RENVELA) 800 MG tablet Take 2,400 mg by mouth daily.   Yes [provider]  timolol (TIMOPTIC) 0.5 % ophthalmic solution Place 1 drop into both eyes daily. 04/04/19  Yes [provider]  traMADol (ULTRAM) 50 MG tablet Take 1 tablet (50 mg total) by mouth 2 (two) times daily as  needed. 11/20/20  Yes Marybelle Killings, MD    Scheduled Meds:  Infusions:  PRN Meds:  Allergies as of 01/07/2021 - Review Complete 01/07/2021  Allergen Reaction Noted  . Augmentin [amoxicillin-pot clavulanate] Diarrhea 12/05/2018  . Mucinex fast-max Other (See Comments) 11/30/2020  . Amphetamines Other (See Comments) 01/15/2012    Family History  Problem Relation Age of Onset  . Heart failure Father   . Hypertension Father   . Diabetes Brother   . Heart attack Brother   . Alzheimer's disease Mother   . Stroke Sister   . Diabetes Sister   . Alzheimer's disease Sister   . Hypertension Brother   . Diabetes Brother   . Drug abuse Brother   . Colon cancer Neg Hx     Social History   Socioeconomic History  . Marital status: Single    Spouse name: Not on file  . Number of children: 1  . Years of education: Not on file  . Highest education level: Not on file  Occupational History  . Occupation: retired  Tobacco Use  . Smoking status: Current Every Day Smoker    Packs/day: 0.50    Years: 43.00    Pack years: 21.50    Types: Cigarettes  . Smokeless tobacco: Never Used  Vaping Use  . Vaping Use: Never used  Substance and Sexual Activity  . Alcohol use: Not Currently    Alcohol/week: 12.0 standard drinks    Types: 12 Standard drinks or equivalent per week  . Drug use: Not Currently    Types: Cocaine    Comment: hx of crack/cocaine 60yrs ago 10/01/2019- none  . Sexual activity: Yes    Partners: Male  Other Topics Concern  . Not on file  Social History Narrative   Retired.       As of 05/2015:   Diet: No salt    Caffeine   Married: Single    House: Condo, 2 stories, 1 person (self)    Pets: No    Current/Past profession: N/A   Exercise: walks daily    Living Will: Yes    DNR   POA/HPOA: No       Social Determinants of Health   Financial Resource Strain: Low Risk   . Difficulty of Paying Living Expenses: Not hard at all  Food Insecurity: No Food  Insecurity  . Worried About Charity fundraiser in the Last Year: Never true  . Ran Out of Food in the Last Year: Never true  Transportation Needs: No Transportation Needs  . Lack of Transportation (Medical): No  . Lack of Transportation (Non-Medical): No  Physical Activity: Not on file  Stress: Not on file  Social Connections: Not on file  Intimate Partner Violence: Not on file    REVIEW OF SYSTEMS: Constitutional: Fatigue. ENT:  No nose bleeds Pulm: Dyspnea on exertion.  No cough CV:  No palpitations, no LE edema.  GU:  No hematuria, no frequency GI: See HPI Heme: Other than the dark stools, no unusual or excessive bleeding or bruising. Transfusions: On multiple occasions. Neuro:  No headaches, no peripheral tingling or numbness.  No syncope, no seizures Derm:  No itching, no rash or sores.  Endocrine:  No sweats or chills.  No polyuria or dysuria Immunization: Not reviewed. Travel:  None beyond local counties in last few months.    PHYSICAL EXAM: Vital signs in last 24 hours: Vitals:   01/07/21 1032 01/07/21 1430  BP: 123/60 132/64  Pulse: 93 89  Resp: 18 16  Temp: 98.5 F (36.9 C)   SpO2: 99% 98%   Wt  Readings from Last 3 Encounters:  12/24/20 80.4 kg  12/21/20 80.3 kg  12/15/20 80.5 kg    General: Patient is pleasant, comfortable.  Looks chronically ill.  NAD. Head: No asymmetry or signs of head trauma Eyes: No icterus.  Conjunctiva pale. Ears: Hearing intact Nose: No congestion or discharge Mouth: Moist, pink, clear oropharynx.  Tongue midline. Neck: No JVD, no masses, no thyromegaly Lungs: Clear bilaterally with generally reduced breath sounds.  No labored breathing or cough. Heart: RRR.  No MRG.  S1, S2 present. Abdomen: Soft.  Not tender or distended.  No HSM, masses, bruits, hernias..   Rectal: Deferred rectal exam.  Stool submitted earlier today was FOBT positive. Musc/Skeltl: No gross joint deformities or swelling. Extremities: No CCE. Neurologic:  Oriented x3.  Moves all 4 limbs.  No asterixis or tremors. Skin: No obvious sores or rashes. Nodes: No cervical adenopathy Psych: Calm, pleasant, cooperative.  Intake/Output from previous day: No intake/output data recorded. Intake/Output this shift: No intake/output data recorded.  LAB RESULTS: Recent Labs    01/07/21 1044  WBC 5.7  HGB 8.0*  HCT 26.2*  PLT 146*   BMET Lab Results  Component Value Date   NA 136 01/07/2021   NA 138 12/24/2020   NA 143 12/23/2020   K 4.3 01/07/2021   K 3.9 12/24/2020   K 3.6 12/23/2020   CL 95 (L) 01/07/2021   CL 104 12/24/2020   CL 106 12/23/2020   CO2 30 01/07/2021   CO2 23 12/24/2020   CO2 23 12/23/2020   GLUCOSE 128 (H) 01/07/2021   GLUCOSE 109 (H) 12/24/2020   GLUCOSE 110 (H) 12/23/2020   BUN 64 (H) 01/07/2021   BUN 113 (H) 12/24/2020   BUN 107 (H) 12/23/2020   CREATININE 5.19 (H) 01/07/2021   CREATININE 6.70 (H) 12/24/2020   CREATININE 5.75 (H) 12/23/2020   CALCIUM 9.3 01/07/2021   CALCIUM 8.4 (L) 12/24/2020   CALCIUM 8.4 (L) 12/23/2020   LFT Recent Labs    01/07/21 1044  PROT 6.5  ALBUMIN 3.1*  AST 21  ALT 13  ALKPHOS 86  BILITOT 0.6   PT/INR Lab Results  Component Value Date   INR 1.2 11/25/2020   INR 1.2 11/24/2020   INR 1.1 11/05/2020   Hepatitis Panel No results for input(s): HEPBSAG, HCVAB, HEPAIGM, HEPBIGM in the last 72 hours. C-Diff No components found for: CDIFF Lipase     Component Value Date/Time   LIPASE 73 (H) 12/21/2020 0834    Drugs of Abuse     Component Value Date/Time   LABOPIA POSITIVE (A) 10/21/2016 2146   COCAINSCRNUR POSITIVE (A) 10/21/2016 2146   COCAINSCRNUR NEGATIVE 09/07/2013 0122   LABBENZ NONE DETECTED 10/21/2016 2146   LABBENZ NEGATIVE 09/07/2013 0122   AMPHETMU NONE DETECTED 10/21/2016 2146   THCU NONE DETECTED 10/21/2016 2146   LABBARB NONE DETECTED 10/21/2016 2146     RADIOLOGY STUDIES: No results found.    IMPRESSION:   *   Anemia.  Consistently FOBT  positive.  Multiple EGDs in the past year with ablation of bleeding and nonbleeding AVMs. Had EGD 16 days ago for melena.  There was bleeding at nodule in the duodenal bulb which was treated with APC, endoclips and hemospray.  Has also had eradication of bleeding and nonbleeding AVMs within the last 4 months and dating back to April 2020.  *    End-stage renal disease  *    Coronary artery disease on Brilinta.  This is now on hold.  His last intervention with drug-eluting stent placement was 1 year ago.  PLAN:     *   Suspect will need upper GI evaluation of some sort. Note the patient is not that interested in a colonoscopy.  A neighbor of his just died and had had a recent colonoscopy so he is fearful of undergoing colonoscopy.  *    Allow diet as there is no nausea or vomiting.  We will make him n.p.o. after midnight in case the MD wants to pursue endoscopy.   Azucena Freed  01/07/2021, 3:26 PM Phone [phone]  GI ATTENDING  History, laboratories, multiple endoscopy reports and x-rays reviewed.  Patient seen and examined.  Agree with comprehensive consultation note as outlined above.  The patient is well-known to me.  Has had recurrent problems with upper GI bleeding and overt GI bleeding due mostly to AVMs.  Recently placed back on Brilinta.  Clinically stable.  Agree with transfusion to appropriate hemoglobin.  Holding Brilinta.  Dialysis tomorrow.  We will plan for upper endoscopic evaluation with possible hemostatic therapy Saturday.  The patient is high risk.The nature of the procedure, as well as the risks, benefits, and alternatives were carefully and thoroughly reviewed with the patient. Ample time for discussion and questions allowed. The patient understood, was satisfied, and agreed to proceed.  Docia Chuck. Geri Seminole., M.D. [facility] Division of Gastroenterology

## 2021-01-07 NOTE — ED Notes (Signed)
Pt ambulated to the bathroom with the use of personal cane.

## 2021-01-08 ENCOUNTER — Other Ambulatory Visit: Payer: Self-pay | Admitting: *Deleted

## 2021-01-08 DIAGNOSIS — I509 Heart failure, unspecified: Secondary | ICD-10-CM | POA: Diagnosis not present

## 2021-01-08 DIAGNOSIS — D62 Acute posthemorrhagic anemia: Secondary | ICD-10-CM | POA: Diagnosis not present

## 2021-01-08 DIAGNOSIS — I5042 Chronic combined systolic (congestive) and diastolic (congestive) heart failure: Secondary | ICD-10-CM | POA: Diagnosis not present

## 2021-01-08 DIAGNOSIS — K921 Melena: Secondary | ICD-10-CM | POA: Diagnosis not present

## 2021-01-08 DIAGNOSIS — K31811 Angiodysplasia of stomach and duodenum with bleeding: Secondary | ICD-10-CM | POA: Diagnosis not present

## 2021-01-08 DIAGNOSIS — K31819 Angiodysplasia of stomach and duodenum without bleeding: Secondary | ICD-10-CM | POA: Diagnosis not present

## 2021-01-08 DIAGNOSIS — E1142 Type 2 diabetes mellitus with diabetic polyneuropathy: Secondary | ICD-10-CM | POA: Diagnosis not present

## 2021-01-08 DIAGNOSIS — J449 Chronic obstructive pulmonary disease, unspecified: Secondary | ICD-10-CM

## 2021-01-08 DIAGNOSIS — Z794 Long term (current) use of insulin: Secondary | ICD-10-CM | POA: Diagnosis not present

## 2021-01-08 DIAGNOSIS — Z7902 Long term (current) use of antithrombotics/antiplatelets: Secondary | ICD-10-CM | POA: Diagnosis not present

## 2021-01-08 DIAGNOSIS — I10 Essential (primary) hypertension: Secondary | ICD-10-CM | POA: Diagnosis not present

## 2021-01-08 LAB — RENAL FUNCTION PANEL
Albumin: 2.7 g/dL — ABNORMAL LOW (ref 3.5–5.0)
Anion gap: 11 (ref 5–15)
BUN: 73 mg/dL — ABNORMAL HIGH (ref 8–23)
CO2: 26 mmol/L (ref 22–32)
Calcium: 8.7 mg/dL — ABNORMAL LOW (ref 8.9–10.3)
Chloride: 100 mmol/L (ref 98–111)
Creatinine, Ser: 6.28 mg/dL — ABNORMAL HIGH (ref 0.61–1.24)
GFR, Estimated: 9 mL/min — ABNORMAL LOW (ref >60)
Glucose, Bld: 151 mg/dL — ABNORMAL HIGH (ref 70–99)
Phosphorus: 4.7 mg/dL — ABNORMAL HIGH (ref 2.5–4.6)
Potassium: 4.1 mmol/L (ref 3.5–5.1)
Sodium: 137 mmol/L (ref 135–145)

## 2021-01-08 LAB — PREPARE RBC (CROSSMATCH)

## 2021-01-08 LAB — GLUCOSE, CAPILLARY
Glucose-Capillary: 127 mg/dL — ABNORMAL HIGH (ref 70–99)
Glucose-Capillary: 207 mg/dL — ABNORMAL HIGH (ref 70–99)
Glucose-Capillary: 76 mg/dL (ref 70–99)
Glucose-Capillary: 146 mg/dL — ABNORMAL HIGH (ref 70–99)

## 2021-01-08 LAB — CBC
HCT: 21.8 % — ABNORMAL LOW (ref 39.0–52.0)
Hemoglobin: 6.9 g/dL — CL (ref 13.0–17.0)
MCH: 30.8 pg (ref 26.0–34.0)
MCHC: 31.7 g/dL (ref 30.0–36.0)
MCV: 97.3 fL (ref 80.0–100.0)
Platelets: 145 10*3/uL — ABNORMAL LOW (ref 150–400)
RBC: 2.24 MIL/uL — ABNORMAL LOW (ref 4.22–5.81)
RDW: 15.7 % — ABNORMAL HIGH (ref 11.5–15.5)
WBC: 4.5 10*3/uL (ref 4.0–10.5)
nRBC: 0 % (ref 0.0–0.2)

## 2021-01-08 LAB — MAGNESIUM: Magnesium: 2.2 mg/dL (ref 1.7–2.4)

## 2021-01-08 MED ORDER — CHLORHEXIDINE GLUCONATE CLOTH 2 % EX PADS
6.0000 | MEDICATED_PAD | Freq: Every day | CUTANEOUS | Status: DC
  Administered 2021-01-08: 6 via TOPICAL

## 2021-01-08 MED ORDER — CALCITRIOL 0.5 MCG PO CAPS
ORAL_CAPSULE | ORAL | Status: AC
  Filled 2021-01-08: qty 1

## 2021-01-08 MED ORDER — LIDOCAINE-PRILOCAINE 2.5-2.5 % EX CREA
1.0000 | TOPICAL_CREAM | CUTANEOUS | Status: DC | PRN
Start: 2021-01-08 — End: 2021-01-08

## 2021-01-08 MED ORDER — PENTAFLUOROPROP-TETRAFLUOROETH EX AERO: 1.0000 "application " | INHALATION_SPRAY | CUTANEOUS | Status: DC | PRN

## 2021-01-08 MED ORDER — ALTEPLASE 2 MG IJ SOLR: 2.0000 mg | Freq: Once | INTRAMUSCULAR | Status: DC | PRN

## 2021-01-08 MED ORDER — HEPARIN SODIUM (PORCINE) 1000 UNIT/ML DIALYSIS: 1000.0000 [IU] | INTRAMUSCULAR | Status: DC | PRN

## 2021-01-08 MED ORDER — SODIUM CHLORIDE 0.9 % IV SOLN: 100.0000 mL | INTRAVENOUS | Status: DC | PRN

## 2021-01-08 MED ORDER — LIDOCAINE HCL (PF) 1 % IJ SOLN: 5.0000 mL | INTRAMUSCULAR | Status: DC | PRN

## 2021-01-08 MED ORDER — DIPHENHYDRAMINE HCL 50 MG/ML IJ SOLN
25.0000 mg | Freq: Once | INTRAMUSCULAR | Status: DC
  Filled 2021-01-08: qty 1

## 2021-01-08 MED ORDER — CALCITRIOL 0.5 MCG PO CAPS
3.0000 ug | ORAL_CAPSULE | ORAL | Status: DC
  Administered 2021-01-08: 3 ug via ORAL

## 2021-01-08 MED ORDER — SODIUM CHLORIDE 0.9% IV SOLUTION: Freq: Once | INTRAVENOUS | Status: DC

## 2021-01-08 MED ORDER — DIPHENHYDRAMINE HCL 25 MG PO CAPS
25.0000 mg | ORAL_CAPSULE | Freq: Once | ORAL | Status: AC
  Administered 2021-01-08: 25 mg via ORAL
  Filled 2021-01-08: qty 1

## 2021-01-08 MED ORDER — CALCITRIOL 0.5 MCG PO CAPS
ORAL_CAPSULE | ORAL | Status: AC
  Filled 2021-01-08: qty 6

## 2021-01-08 NOTE — Progress Notes (Addendum)
Daily Rounding Note  01/08/2021, 11:37 AM  LOS: 0 days   SUBJECTIVE:   Chief complaint: dark stools.  anemia   Stools less dark, more deep brown today.  Does not want to have another EGD. C/o hoarseness after previous EGDs.  Was really hoping he could go home today.   Frustrated that he is still bleeding after being treated for same earlier this month.   Also tells me that his PCP NP Dewaine Oats at [facility] is making arrangements for him to be seen by [facility] but no confirmed appt yet.    OBJECTIVE:         Vital signs in last 24 hours:    Temp:  [97.9 F (36.6 C)-98.4 F (36.9 C)] 97.9 F (36.6 C) (03/18 0905) Pulse Rate:  [89-98] 89 (03/18 0905) Resp:  [16-20] 20 (03/18 0905) BP: (126-148)/(59-69) 126/69 (03/18 0905) SpO2:  [96 %-100 %] 97 % (03/18 0905) Weight:  [78.5 kg] 78.5 kg (03/17 1932) Last BM Date: 01/08/21 Filed Weights   01/07/21 1932  Weight: 78.5 kg   General: NAD.  Not ill looking   Heart: RRR Chest: clear bil.  No dyspnea or cough.  Vocal quality mildly raspy/rough. Abdomen: soft, NT, ND.  No HSM, masses, bruits, hernias.  Active BS  Extremities: no CCE Neuro/Psych:  Oriented x 3.  Alert.    Intake/Output from previous day: 03/17 0701 - 03/18 0700 In: 120 [P.O.:120] Out: 0   Intake/Output this shift: Total I/O In: 120 [P.O.:120] Out: -   Lab Results: Recent Labs    01/07/21 1044  WBC 5.7  HGB 8.0*  HCT 26.2*  PLT 146*   BMET Recent Labs    01/07/21 1044  NA 136  K 4.3  CL 95*  CO2 30  GLUCOSE 128*  BUN 64*  CREATININE 5.19*  CALCIUM 9.3   LFT Recent Labs    01/07/21 1044  PROT 6.5  ALBUMIN 3.1*  AST 21  ALT 13  ALKPHOS 86  BILITOT 0.6   PT/INR No results for input(s): LABPROT, INR in the last 72 hours. Hepatitis Panel No results for input(s): HEPBSAG, HCVAB, HEPAIGM, HEPBIGM in the last 72 hours.  Studies/Results: No results found.   Scheduled  Meds: . acetaminophen  1,000 mg Oral TID  . allopurinol  100 mg Oral Daily  . bictegravir-emtricitabine-tenofovir AF  1 tablet Oral Daily  . calcitRIOL  3 mcg Oral Q M,W,F-HD  . carvedilol  3.125 mg Oral BID  . Chlorhexidine Gluconate Cloth  6 each Topical Q0600  . folic acid  1 mg Oral Daily  . gabapentin  300 mg Oral Daily  . insulin aspart  0-6 Units Subcutaneous TID WC  . isosorbide mononitrate  30 mg Oral Daily  . latanoprost  1 drop Both Eyes QHS  . multivitamin  1 tablet Oral Daily  . pantoprazole  40 mg Oral BID  . timolol  1 drop Both Eyes Daily  . umeclidinium-vilanterol  1 puff Inhalation Daily   Continuous Infusions: PRN Meds:.colchicine, diclofenac Sodium, nitroGLYCERIN, polyethylene glycol   ASSESMENT:   *  Recurrent, acute on chronic blood loss anemia.   Multiple endos in last 2 years with clipping, APC of bleeding and non-bleeding AVMs.  Latest EGD w clipping of (? AVM) at nodule at base of duodenal bulb.   Fup Hgb pndg  Protonix 40 mg po bid in place.    *   Thrombocytopenia.  Platlets 104 >> 146  *   ESRD.  HD MWF  *    HIV on Bitkarvy.    *   Chronic Brilinta for 11/2019 cardiac DES.  Cards APP note of 3/17 is still not cosigned but mentions ok to hold Brilinta, and replace w ASA. From GI standpoint prefer permanent discontinution of Brilinta but cards MD needs to ok this.     PLAN   *   Upper endoscopy tomorrow, pt reluctantly agreeing to proceed.  NPO after midnight.    *   Await Cards MD opinion re Brilinta.    Azucena Freed  01/08/2021, 11:37 AM Phone [phone]  GI ATTENDING  Interval history data reviewed.  Agree with interval progress note.  Patient in dialysis.  Plans for upper endoscopy tomorrow to rule out recurrent AVMs or other pathology.  At this point, given the recurrent nature of his bleeding that requires hospitalization, wondering if it would be reasonable to stop his Brilinta since it has been over 1 year since his drug-eluting  stent was placed.  This will be left solely to cardiology discretion.  Patient is high risk for his endoscopic procedure.  Docia Chuck. Geri Seminole., M.D. [facility] Division of Gastroenterology

## 2021-01-08 NOTE — Consult Note (Signed)
[facility] Renal Consultation Note    Indication for Consultation:  Management of ESRD/hemodialysis, anemia, hypertension/volume, and secondary hyperparathyroidism.  HPI: Jacob [name] is a 70 y.o. male with PMH including ESRD on dialysis MWF, COPD, HFrEF, and recurrent GI bleeds due to AVMs, presenting to the ED on 01/07/21 with melena. He was recently admitted from 12/21/20-12/24/20 with melena (prior to this admission he presented to ED with the same but left AMA), and underwent EGD 3/1 with APC and clipping x4. Received 5 units PRBC during that admission. Brilinta was resumed after discharge. Mr. Leeson reports he was doing well but began having melena again on 3/16 so he presented to the ED for eval. Hgb was 8.0. GI recommended admission for EGD. Nephrology consulted for management of ESRD during admission.   Patient dialyzes on MWF schedule. His last dialysis session was 01/06/21 and he did complete his entire session. Missed HD on 3/11. His last outpatient hemoglobin was 8.9 on 01/06/21 and he received mircera 19mcg on 01/06/21.   At present, patient expresses frustration with recurrent GI bleeds. He denies any CP, palpitations, dizziness, SOB, orthopnea, cough, abdominal pain, nausea, vomiting and diarrhea. He does report a scratchy throat since yesterday. No fevers, chills or fatigue.   Past Medical History:  Diagnosis Date  . Acute respiratory failure ([place]) 03/01/2018  . Arthritis    "all over; mostly knees and back" (02/28/2018)  . Chronic lower back pain    stenosis  . Community acquired pneumonia 09/06/2013  . COPD (chronic obstructive pulmonary disease) ([place])   . Coronary atherosclerosis of native coronary artery    a. 02/2003 s/p CABG x 2 (VG->RI, VG->RPDA; b. 11/2019 PCI: LM nl, LAD 90d, D3 50, RI 100, LCX 100p, OM3 100 - fills via L->L collats from D2/dLAD, RCA 100p, VG->RPDA ok, VG->RI 95 (3.5x48 Synergy XD DES).  . Drug abuse ([place])    hx; tested for cocaine as recently  as 2/08. says he is not using drugs now - avoided defib. for this reason   . ESRD (end stage renal disease) ([place])    Hemo M-W-F- Richarda Blade  . Fall at home 10/2020  . GERD (gastroesophageal reflux disease)    takes OTC meds as needed  . GI bleeding    a. 11/2019 EGD: angiodysplastic lesions w/ bleeding s/p argon plasma/clipping/epi inj.  . Glaucoma    uses eye drops daily  . Hepatitis B 1968   "tx'd w/isolation; caught it from toilet stools in gym"  . HFrEF (heart failure with reduced ejection fraction) ([place])    a. 01/2019 Echo: EF 40-45%, diffuse HK, mild basal septal hypertrophy.  . History of blood transfusion 03/01/2019  . History of colon polyps    benign  . History of gout    takes Allopurinol daily as well as Colchicine-if needed (02/28/2018)  . History of kidney stones   . HTN (hypertension)    takes Coreg,Imdur.and Apresoline daily  . Human immunodeficiency virus (HIV) disease ([place]) dx'd 70   on [place] as of 12/2020.    [name] Hyperlipidemia    takes Atorvastatin daily  . Ischemic cardiomyopathy    a. 01/2019 Echo: EF 40-45%, diffuse HK, mild basal septal hypertrophy. Diast dysfxn. Nl RV size/fxn. Sev dil LA. Triv MR/TR/PR.  [name] Muscle spasm    takes Zanaflex as needed  . Myocardial infarction ([place]) ~ 2004/2005  . Nocturia   . Peripheral neuropathy    takes gabapentin daily  . Pneumonia    "at least  twice" (02/28/2018)  . Syphilis, unspecified   . Type II diabetes mellitus ([place]) 2004   Lantus daily.Average fasting blood sugar 125-199  . Wears glasses   . Wears partial dentures    Past Surgical History:  Procedure Laterality Date  . AV FISTULA PLACEMENT Left 08/02/2018   Procedure: ARTERIOVENOUS (AV) FISTULA CREATION  left arm radiocephlic;  Surgeon: Marty Heck, MD;  Location: [place];  Service: Vascular;  Laterality: Left;  . AV FISTULA PLACEMENT Left 08/01/2019   Procedure: LEFT BRACHIOCEPHALIC ARTERIOVENOUS (AV) FISTULA CREATION;  Surgeon: [name], MD;   Location: [place];  Service: Vascular;  Laterality: Left;  . BASCILIC VEIN TRANSPOSITION Left 10/03/2019   Procedure: BASILIC VEIN TRANSPOSITION LEFT SECOND STAGE;  Surgeon: [name], MD;  Location: [place];  Service: Vascular;  Laterality: Left;  . CARDIAC CATHETERIZATION  10/2002; 12/19/2004   [name] 03/08/2011  . COLONOSCOPY  2013   Dr.John Henrene Pastor   . CORONARY ARTERY BYPASS GRAFT  02/24/2003   CABG X2/notes 03/08/2011  . CORONARY STENT INTERVENTION N/A 12/19/2019   Procedure: CORONARY STENT INTERVENTION;  Surgeon: Jettie Booze, MD;  Location: [place] CV LAB;  Service: Cardiovascular;  Laterality: N/A;  . ESOPHAGOGASTRODUODENOSCOPY (EGD) WITH PROPOFOL N/A 02/08/2019   Procedure: ESOPHAGOGASTRODUODENOSCOPY (EGD) WITH PROPOFOL;  Surgeon: Milus Banister, MD;  Location: [place];  Service: Gastroenterology;  Laterality: N/A;  . ESOPHAGOGASTRODUODENOSCOPY (EGD) WITH PROPOFOL N/A 12/22/2019   Procedure: ESOPHAGOGASTRODUODENOSCOPY (EGD) WITH PROPOFOL;  Surgeon: Lavena Bullion, DO;  Location: [place];  Service: Gastroenterology;  Laterality: N/A;  . ESOPHAGOGASTRODUODENOSCOPY (EGD) WITH PROPOFOL N/A 10/19/2020   Procedure: ESOPHAGOGASTRODUODENOSCOPY (EGD) WITH PROPOFOL;  Surgeon: Jackquline Denmark, MD;  Location: [facility] ENDOSCOPY;  Service: Endoscopy;  Laterality: N/A;  . ESOPHAGOGASTRODUODENOSCOPY (EGD) WITH PROPOFOL N/A 12/22/2020   Procedure: ESOPHAGOGASTRODUODENOSCOPY (EGD) WITH PROPOFOL;  Surgeon: Gatha Mayer, MD;  Location: [place];  Service: Endoscopy;  Laterality: N/A;  . HEMOSTASIS CLIP PLACEMENT  12/22/2019   Procedure: HEMOSTASIS CLIP PLACEMENT;  Surgeon: Lavena Bullion, DO;  Location: [place] ENDOSCOPY;  Service: Gastroenterology;;  . HEMOSTASIS CLIP PLACEMENT  12/22/2020   Procedure: HEMOSTASIS CLIP PLACEMENT;  Surgeon: Gatha Mayer, MD;  Location: [facility] ENDOSCOPY;  Service: Endoscopy;;  . HEMOSTASIS CONTROL  12/22/2020   Procedure: HEMOSTASIS CONTROL/hemospray;  Surgeon:  Gatha Mayer, MD;  Location: [place];  Service: Endoscopy;;  . HOT HEMOSTASIS N/A 02/08/2019   Procedure: HOT HEMOSTASIS (ARGON PLASMA COAGULATION/BICAP);  Surgeon: Milus Banister, MD;  Location: [facility] ENDOSCOPY;  Service: Gastroenterology;  Laterality: N/A;  . HOT HEMOSTASIS N/A 12/22/2019   Procedure: HOT HEMOSTASIS (ARGON PLASMA COAGULATION/BICAP);  Surgeon: Lavena Bullion, DO;  Location: [facility] ENDOSCOPY;  Service: Gastroenterology;  Laterality: N/A;  . HOT HEMOSTASIS N/A 10/19/2020   Procedure: HOT HEMOSTASIS (ARGON PLASMA COAGULATION/BICAP);  Surgeon: Jackquline Denmark, MD;  Location: [facility] ENDOSCOPY;  Service: Endoscopy;  Laterality: N/A;  . HOT HEMOSTASIS N/A 12/22/2020   Procedure: HOT HEMOSTASIS (ARGON PLASMA COAGULATION/BICAP);  Surgeon: Gatha Mayer, MD;  Location: [facility] ENDOSCOPY;  Service: Endoscopy;  Laterality: N/A;  . INTERTROCHANTERIC HIP FRACTURE SURGERY Left 11/2006   [name] 03/08/2011  . INTRAVASCULAR ULTRASOUND/IVUS N/A 12/19/2019   Procedure: Intravascular Ultrasound/IVUS;  Surgeon: Jettie Booze, MD;  Location: [place] CV LAB;  Service: Cardiovascular;  Laterality: N/A;  . IR FLUORO GUIDE CV LINE RIGHT  07/24/2019  . IR FLUORO GUIDE CV LINE RIGHT  07/30/2019  . IR US GUIDE VASC ACCESS RIGHT  07/24/2019  . IR  US GUIDE VASC ACCESS RIGHT  07/30/2019  . LAPAROSCOPIC CHOLECYSTECTOMY  05/2006  . LIGATION OF COMPETING BRANCHES OF ARTERIOVENOUS FISTULA Left 11/05/2018   Procedure: LIGATION OF COMPETING BRANCHES OF ARTERIOVENOUS FISTULA  LEFT  ARM;  Surgeon: Marty Heck, MD;  Location: [place];  Service: Vascular;  Laterality: Left;  . LUMBAR LAMINECTOMY/DECOMPRESSION MICRODISCECTOMY N/A 02/29/2016   Procedure: Left L4-5 Lateral Recess Decompression, Removal Extradural Intraspinal Facet Cyst;  Surgeon: Marybelle Killings, MD;  Location: [place];  Service: Orthopedics;  Laterality: N/A;  . MULTIPLE TOOTH EXTRACTIONS    . ORIF MANDIBULAR FRACTURE Left 08/13/2004   ORIF of left body  fracture mandible with KLS Martin 2.3-mm six hole/notes 03/08/2011  . RIGHT/LEFT HEART CATH AND CORONARY/GRAFT ANGIOGRAPHY N/A 12/19/2019   Procedure: RIGHT/LEFT HEART CATH AND CORONARY/GRAFT ANGIOGRAPHY;  Surgeon: Jettie Booze, MD;  Location: [place] CV LAB;  Service: Cardiovascular;  Laterality: N/A;  . SCLEROTHERAPY  12/22/2019   Procedure: SCLEROTHERAPY;  Surgeon: Lavena Bullion, DO;  Location: [facility] ENDOSCOPY;  Service: Gastroenterology;;   Family History  Problem Relation Age of Onset  . Heart failure Father   . Hypertension Father   . Diabetes Brother   . Heart attack Brother   . Alzheimer's disease Mother   . Stroke Sister   . Diabetes Sister   . Alzheimer's disease Sister   . Hypertension Brother   . Diabetes Brother   . Drug abuse Brother   . Colon cancer Neg Hx    Social History:  reports that he has been smoking cigarettes. He has a 21.50 pack-year smoking history. He has never used smokeless tobacco. He reports previous alcohol use of about 12.0 standard drinks of alcohol per week. He reports previous drug use. Drug: Cocaine.  ROS: As per HPI otherwise negative.  Physical Exam: Vitals:   01/07/21 2333 01/08/21 0342 01/08/21 0857 01/08/21 0905  BP: 126/65 (!) 126/59  126/69  Pulse: 98 92 91 89  Resp: 18 16 16 20   Temp: 98.4 F (36.9 C) 97.9 F (36.6 C)  97.9 F (36.6 C)  SpO2: 96% 99% 100% 97%  Weight:      Height:         General: Well developed, well nourished, in no acute distress. Head: Normocephalic, atraumatic, sclera non-icteric, mucus membranes are moist. Neck: JVD not elevated. Lungs: Clear bilaterally to auscultation without wheezes, rales, or rhonchi. Breathing is unlabored. On RA.  Heart: RRR with normal S1, S2. No murmurs, rubs, or gallops appreciated. Abdomen: Soft, non-tender, non-distended with normoactive bowel sounds. No rebound/guarding. No obvious abdominal masses. Musculoskeletal:  Strength and tone appear normal for age. Lower  extremities: No edema bilateral lower extremities.  Neuro: Alert and oriented X 3. Moves all extremities spontaneously. Psych:  Responds to questions appropriately with a normal affect. Dialysis Access: LUE AVF + bruit  Allergies  Allergen Reactions  . Augmentin [Amoxicillin-Pot Clavulanate] Diarrhea    Severe diarrhea  . Mucinex Fast-Max Other (See Comments)    Intensive sweating   . Amphetamines Other (See Comments)    Unknown reaction   Prior to Admission medications   Medication Sig Start Date End Date Taking? Authorizing Provider  acetaminophen (TYLENOL) 650 MG CR tablet Take 650-1,300 mg by mouth 3 (three) times daily.   Yes [provider]  allopurinol (ZYLOPRIM) 100 MG tablet TAKE 1 TABLET(100 MG) BY MOUTH DAILY Patient taking differently: Take 100 mg by mouth daily. 07/23/20  Yes [name], NP  ANORO ELLIPTA 62.5-25 MCG/INH  AEPB INHALE 1 PUFF BY MOUTH EVERY DAY Patient taking differently: Inhale 1 puff into the lungs daily. 12/21/20  Yes [name], NP  B-D UF III MINI PEN NEEDLES 31G X 5 MM MISC USE FOUR TIMES DAILY 03/02/20  Yes [name], NP  bictegravir-emtricitabine-tenofovir AF (BIKTARVY) 50-200-25 MG TABS tablet Take 1 tablet by mouth daily. 06/30/20  Yes [name], MD  carvedilol (COREG) 3.125 MG tablet Take 1 tablet (3.125 mg total) by mouth 2 (two) times daily. 02/13/20  Yes [name], MD  Cinacalcet HCl (SENSIPAR PO) Take 1 tablet by mouth 3 (three) times a week. At dialysis 08/14/20 08/13/21 Yes [provider]  colchicine 0.6 MG tablet Take 0.6-1.2 mg by mouth daily as needed (gout flare-up).   Yes [provider]  diclofenac Sodium (VOLTAREN) 1 % GEL APPLY 2 GRAMS TO THE AFFECTED AREA THREE TIMES DAILY AS NEEDED FOR PAIN Patient taking differently: Apply 2 g topically 3 (three) times daily as needed (pain). 11/16/20  Yes [name], NP  folic acid (FOLVITE) 1 MG tablet Take 1 tablet (1 mg total) by  mouth daily. 11/30/20  Yes [name], NP  gabapentin (NEURONTIN) 300 MG capsule Take 1 capsule (300 mg total) by mouth daily. Dose change due to renal function 12/09/20  Yes Eubanks, [name], NP  insulin lispro (HUMALOG KWIKPEN) 100 UNIT/ML KwikPen Inject 0.25ml (20 Units total) into the skin 3 (three) times daily as needed for high blood sugar (above 150) Patient taking differently: Inject 0.68ml (20 Units total) into the skin 3 (three) times daily as needed for high blood sugar (above 150) 03/12/20  Yes [name], NP  iron sucrose in sodium chloride 0.9 % 100 mL Iron Sucrose (Venofer) 07/08/20 07/07/21 Yes [provider]  isosorbide mononitrate (IMDUR) 30 MG 24 hr tablet TAKE 1 TABLET BY MOUTH EVERY DAY Patient taking differently: Take 30 mg by mouth daily. 08/10/20  Yes [name], NP  Lancets (ONETOUCH DELICA PLUS SEGBTD17O) MISC Inject 1 Device as directed 3 (three) times daily. Dx: E11.40 09/18/19  Yes [name], NP  latanoprost (XALATAN) 0.005 % ophthalmic solution Place 1 drop into both eyes at bedtime.   Yes [provider]  Methoxy PEG-Epoetin Beta (MIRCERA IJ) Mircera 12/25/20  Yes [provider]  multivitamin (RENA-VIT) TABS tablet Take 1 tablet by mouth daily.   Yes [provider]  nitroGLYCERIN (NITROSTAT) 0.3 MG SL tablet ONE TABLET UNDER TONGUE AS NEEDED FOR CHEST PAIN Patient taking differently: Place 0.3 mg under the tongue every 5 (five) minutes as needed for chest pain. 05/01/19  Yes [name], MD  ONETOUCH ULTRA test strip CHECK BLOOD SUGAR 3 TIMES  DAILY Patient taking differently: 1 each by Other route 3 (three) times daily. 05/27/20  Yes [name], NP  pantoprazole (PROTONIX) 40 MG tablet TAKE 1 TABLET(40 MG) BY MOUTH TWICE DAILY Patient taking differently: Take 40 mg by mouth 2 (two) times daily. 10/19/20  Yes Fayrene Helper, MD  polyethylene glycol (MIRALAX / GLYCOLAX) 17 g packet Take 17 g by  mouth daily as needed for mild constipation. 11/07/20  Yes Aline August, MD  sevelamer carbonate (RENVELA) 800 MG tablet Take 2,400 mg by mouth daily.   Yes [provider]  timolol (TIMOPTIC) 0.5 % ophthalmic solution Place 1 drop into both eyes daily. 04/04/19  Yes [provider]  traMADol (ULTRAM) 50 MG tablet Take 1 tablet (50 mg total) by  mouth 2 (two) times daily as needed. 11/20/20  Yes Marybelle Killings, MD   Current Facility-Administered Medications  Medication Dose Route Frequency Provider Last Rate Last Admin  . acetaminophen (TYLENOL) tablet 1,000 mg  1,000 mg Oral TID Bonnell Public Tublu, MD   1,000 mg at 01/07/21 2207  . allopurinol (ZYLOPRIM) tablet 100 mg  100 mg Oral Daily Bonnell Public Tublu, MD      . bictegravir-emtricitabine-tenofovir AF (BIKTARVY) 50-200-25 MG per tablet 1 tablet  1 tablet Oral Daily Bonnell Public Tublu, MD      . carvedilol (COREG) tablet 3.125 mg  3.125 mg Oral BID Bonnell Public Tublu, MD      . Chlorhexidine Gluconate Cloth 2 % PADS 6 each  6 each Topical Q0600 Gean Quint, MD   6 each at 01/08/21 0746  . colchicine tablet 0.6-1.2 mg  0.6-1.2 mg Oral Daily PRN Bonnell Public Tublu, MD      . diclofenac Sodium (VOLTAREN) 1 % topical gel 2 g  2 g Topical TID PRN Bonnell Public Tublu, MD      . folic acid (FOLVITE) tablet 1 mg  1 mg Oral Daily Bonnell Public Tublu, MD      . gabapentin (NEURONTIN) capsule 300 mg  300 mg Oral Daily Bonnell Public Tublu, MD   300 mg at 01/07/21 2207  . insulin aspart (novoLOG) injection 0-6 Units  0-6 Units Subcutaneous TID WC Bonnell Public Tublu, MD      . isosorbide mononitrate (IMDUR) 24 hr tablet 30 mg  30 mg Oral Daily Chatterjee, Srobona Tublu, MD      . latanoprost (XALATAN) 0.005 % ophthalmic solution 1 drop  1 drop Both Eyes QHS Vashti Hey, MD   1 drop at 01/07/21 2209  . multivitamin (RENA-VIT) tablet 1 tablet  1 tablet Oral Daily  Bonnell Public Tublu, MD      . nitroGLYCERIN (NITROSTAT) SL tablet 0.4 mg  0.4 mg Sublingual Q5 min PRN Bonnell Public Tublu, MD      . pantoprazole (PROTONIX) EC tablet 40 mg  40 mg Oral BID Bonnell Public Tublu, MD      . polyethylene glycol (MIRALAX / GLYCOLAX) packet 17 g  17 g Oral Daily PRN Bonnell Public Tublu, MD      . sevelamer carbonate (RENVELA) tablet 2,400 mg  2,400 mg Oral Q breakfast Bonnell Public Tublu, MD   2,400 mg at 01/08/21 0746  . timolol (TIMOPTIC) 0.5 % ophthalmic solution 1 drop  1 drop Both Eyes Daily Chatterjee, Dewaine Oats Tublu, MD      . umeclidinium-vilanterol (ANORO ELLIPTA) 62.5-25 MCG/INH 1 puff  1 puff Inhalation Daily Bonnell Public Tublu, MD   1 puff at 01/08/21 0856   Labs: Basic Metabolic Panel: Recent Labs  Lab 01/07/21 1044  NA 136  K 4.3  CL 95*  CO2 30  GLUCOSE 128*  BUN 64*  CREATININE 5.19*  CALCIUM 9.3   Liver Function Tests: Recent Labs  Lab 01/07/21 1044  AST 21  ALT 13  ALKPHOS 86  BILITOT 0.6  PROT 6.5  ALBUMIN 3.1*   CBC: Recent Labs  Lab 01/07/21 1044  WBC 5.7  HGB 8.0*  HCT 26.2*  MCV 99.2  PLT 146*   CBG: Recent Labs  Lab 01/07/21 2202 01/08/21 0650  GLUCAP 161* 127*   Outpatient Dialysis Orders:  Center: [facility] on MWF. 180NRE, 4 hous, BFR 400, DFR 500 (due to dialysate shortage), EDW 77kg, 2K, 2Ca, AVF 15g No heparin  Mircera 116mcg q 2 weeks- last dose 01/06/21 Calcitriol 3.31mcg PO q HD Phosphorus binder: Renvela 800mg , take 3 tabs PO TID with meals  Assessment/Plan: 1.  Recurrent GI bleed: Pt with history of recurrent bleeds due to AVMs. Most recently had EGD on 12/22/20. Hgb 8.0, declined from 8.6 two days ago. GI planning repeat EGD.  2.  ESRD:  K+ controlled, not overtly uremic on exam. Planned for dialysis today. Continue MWF schedule. 3.  Hypertension/volume: BP overall controlled (refused BP meds this AM). Continue current medications. No evidence of  volume overload on exam. UF with HD as tolerated.  4.  Anemia: Worsening anemia due to #1. Recently received dose of ESA. Transfuse PRN.  5.  Metabolic bone disease: Corrected calcium 10. On high dose of calcitriol, will reduce slightly. Renvela can rarely cause deposition of sevelamer crystals in the GI tract leading to GI bleed and abdominal pain, though these were not noted on recent EGD. Will hold renvela for now and recheck a phos level, consider trial of a different binder.  6.  Nutrition:  Renal/carb modified with fluid restrictions. 7. HIV: On biktarvy 8. T2DM: Contiune insulin per primary team 9. CAD:  On carvedilol and imdur. Appears brilinta is on hold due to GI bleeds.   Anice Paganini, PA-C 01/08/2021, 9:10 AM  [facility] Pager: [phone]

## 2021-01-08 NOTE — Plan of Care (Signed)
  Problem: Bowel/Gastric: Goal: Will show no signs and symptoms of gastrointestinal bleeding Outcome: Progressing   Problem: Fluid Volume: Goal: Compliance with measures to maintain balanced fluid volume will improve Outcome: Progressing   Problem: Nutritional: Goal: Ability to make healthy dietary choices will improve Outcome: Progressing

## 2021-01-08 NOTE — Progress Notes (Signed)
New Admission Note:  Arrival Method:Via stretcher from ED to 57m16  Mental Orientation: Alert & Oriented x4 Telemetry: CCMD verified Assessment: Completed Skin: Refer to flowsheet IV: None Pain: 0/10 Tubes: Safety Measures: Safety Fall Prevention Plan discussed with patient. Admission: Completed 5 [place] Orientation: Patient has been orientated to the room, unit and the staff.  Orders have been reviewed and are being implemented. Will continue to monitor the patient. Call light has been placed within reach and bed alarm has been activated.   Vassie Moselle, RN  Phone Number: [phone]

## 2021-01-08 NOTE — Progress Notes (Signed)
Informed by Tele monitor Tech that patient had 9 beats of VTach . Informed MD and patient claims he tried to get out of bed. Patient denies of any discomforts.

## 2021-01-08 NOTE — [place] Notes (Addendum)
PROGRESS NOTE  JOSHVA LABRECK WLN:[id number] DOB: [birth year] DOA: 01/07/2021 PCP: [name], NP   LOS: 0 days   Brief narrative:  Jacob [name] is an 70 y.o. male with past medical history significant for ESRD on HD MWF, HIV, COPD, diabetes mellitus type 2,, CAD on Brilinta and history of recurrent GI bleed secondary to AVM still with melena..  Of note patient was recently treated in house 2 weeks ago for bloody stools.  He had signed out [place] however had returned after several more stools.  Patient had AVMs cauterized in December 2021 however his need for Brilinta for his CAD complicated issues of GI bleeding.  EGD done on 3/ 1 showed duodenal bulb nodule with steady nonpulsatile bleeding and was treated with APC and clipping x4.  He had needed 5 units of PRBCs during that time.  Patient was discharged home on Protonix with Brilinta being held for 5 days.  In the ED, patient was noted to be hemodynamically stable.  With hemoglobin of 8.0, increased from 7.1 on discharge 12/24/2020. Plan was initially to discharge patient home as he was hemodynamically stable without need for transfusion however low [facility] who follows the patient requested admission for endoscopy, possible capsule endoscopy as warranted.  Assessment/Plan:  Principal Problem:   Melena Active Problems:   Human immunodeficiency virus (HIV) disease (HCC)   Chronic combined systolic (congestive) and diastolic (congestive) heart failure (HCC)   HTN (hypertension)   DM (diabetes mellitus) ([place])   Type 2 diabetes mellitus with diabetic polyneuropathy, with long-term current use of insulin (HCC)   CHF (congestive heart failure) (HCC)   Chronic obstructive pulmonary disease (HCC)   Acute blood loss anemia   AVM (arteriovenous malformation) of duodenum, acquired   Recurrent GI bleed/melena Fecal occult blood was positive.  She has remained hemodynamically stable.  Hemoglobin of 8.0 today.  GI has seen  the patient and recommend EGD 01/09/2021.  Continue Protonix.  CBC Latest Ref Rng & Units 01/07/2021 12/24/2020 12/23/2020  WBC 4.0 - 10.5 K/uL 5.7 6.5 -  Hemoglobin 13.0 - 17.0 g/dL 8.0(L) 7.1(L) 7.9(L)  Hematocrit 39.0 - 52.0 % 26.2(L) 20.6(L) 23.0(L)  Platelets 150 - 400 K/uL 146(L) 104(L) -    ESRD on Monday Wednesday Friday Nephrology on board for hemodialysis.  HIV Continue Biktarvy   Diabetes mellitus type 2. Continue sliding scale insulin Accu-Cheks, diabetic diet.  Patient uses lispro 20 units 3 times a day as needed for blood glucose level more than 150 at home.  History of POC glucose of 127  Essential HTN Continue carvedilol closely monitor blood pressure.  Latest blood pressure of 126/69  Coronary artery disease. No acute chest pain.  Continue carvedilol, Imdur.  Brilinta on hold at this time.  Congestive heart failure. Continue Coreg.  Fluid management as per hemodialysis.  No acute decompensation.  Gout Continue colchicine and allopurinol   COPD continue Anoro Ellipta.  Currently compensated.  Glaucoma Continue latanoprost and timolol  DVT prophylaxis: SCDs Start: 01/07/21 1903  Code Status: Full code  Family Communication: None  Status is: Observation  The patient will require care spanning > 2 midnights and should be moved to inpatient because: IV treatments appropriate due to intensity of illness or inability to take PO and Inpatient level of care appropriate due to severity of illness  Dispo: The patient is from: Home              Anticipated d/c is to: Home  Patient currently is not medically stable to d/c.   Difficult to place patient No   Consultants:  Nephrology  GI  Procedures:  None yet  Anti-infectives:  Phillips Odor  Anti-infectives (From admission, onward)   Start     Dose/Rate Route Frequency Ordered Stop   01/08/21 1000  bictegravir-emtricitabine-tenofovir AF (BIKTARVY) 50-200-25 MG per tablet 1 tablet         1 tablet Oral Daily 01/07/21 1902        Subjective:  Today, patient was seen and examined at bedside.  Denies any dizziness lightheadedness chest pain palpitation, had bowel movement but was unsure whether he had any blood.  nursing staff reported few beats of V. tach.  Objective: Vitals:   01/08/21 0857 01/08/21 0905  BP:  126/69  Pulse: 91 89  Resp: 16 20  Temp:  97.9 F (36.6 C)  SpO2: 100% 97%    Intake/Output Summary (Last 24 hours) at 01/08/2021 1049 Last data filed at 01/08/2021 0752 Gross per 24 hour  Intake 240 ml  Output 0 ml  Net 240 ml   Filed Weights   01/07/21 1932  Weight: 78.5 kg   Body mass index is 22.22 kg/m.   Physical Exam:  GENERAL: Patient is alert awake and oriented. Not in obvious distress. HENT: Mild pallor noted.. Pupils equally reactive to light. Oral mucosa is moist, mild congestion  ingestion NECK: is supple, no gross swelling noted. CHEST:  Diminished breath sounds bilaterally. CVS: S1 and S2 heard, no murmur. Regular rate and rhythm.  ABDOMEN: Soft, protuberant abdomen, non-tender, bowel sounds are present. EXTREMITIES: No edema.  Left upper extremity AV fistula in place. CNS: Cranial nerves are intact. No focal motor deficits. SKIN: warm and dry without rashes.  Data Review: I have personally reviewed the following laboratory data and studies,  CBC: Recent Labs  Lab 01/07/21 1044  WBC 5.7  HGB 8.0*  HCT 26.2*  MCV 99.2  PLT 308*   Basic Metabolic Panel: Recent Labs  Lab 01/07/21 1044  NA 136  K 4.3  CL 95*  CO2 30  GLUCOSE 128*  BUN 64*  CREATININE 5.19*  CALCIUM 9.3   Liver Function Tests: Recent Labs  Lab 01/07/21 1044  AST 21  ALT 13  ALKPHOS 86  BILITOT 0.6  PROT 6.5  ALBUMIN 3.1*   No results for input(s): LIPASE, AMYLASE in the last 168 hours. No results for input(s): AMMONIA in the last 168 hours. Cardiac Enzymes: No results for input(s): CKTOTAL, CKMB, CKMBINDEX, TROPONINI in the last 168  hours. BNP (last 3 results) No results for input(s): BNP in the last 8760 hours.  ProBNP (last 3 results) No results for input(s): PROBNP in the last 8760 hours.  CBG: Recent Labs  Lab 01/07/21 2202 01/08/21 0650  GLUCAP 161* 127*   Recent Results (from the past 240 hour(s))  SARS CORONAVIRUS 2 (TAT 6-24 HRS) Nasopharyngeal Nasopharyngeal Swab     Status: None   Collection Time: 01/07/21  3:10 PM   Specimen: Nasopharyngeal Swab  Result Value Ref Range Status   SARS Coronavirus 2 NEGATIVE NEGATIVE Final    Comment: (NOTE) SARS-CoV-2 target nucleic acids are NOT DETECTED.  The SARS-CoV-2 RNA is generally detectable in upper and lower respiratory specimens during the acute phase of infection. Negative results do not preclude SARS-CoV-2 infection, do not rule out co-infections with other pathogens, and should not be used as the sole basis for treatment or other patient management decisions. Negative results must be  combined with clinical observations, patient history, and epidemiological information. The expected result is Negative.  Fact Sheet for Patients: SugarRoll.be  Fact Sheet for Healthcare Providers: https://www.woods-mathews.com/  This test is not yet approved or cleared by the [place] FDA and  has been authorized for detection and/or diagnosis of SARS-CoV-2 by FDA under an Emergency Use Authorization (EUA). This EUA will remain  in effect (meaning this test can be used) for the duration of the COVID-19 declaration under Se ction 564(b)(1) of the Act, 21 U.S.C. section 360bbb-3(b)(1), unless the authorization is terminated or revoked sooner.  Performed at [facility], [place] [address], [place], [place] [address]      Studies: No results found.    Flora Lipps, MD  [facility] 01/08/2021  If 7PM-7AM, please contact night-coverage

## 2021-01-08 NOTE — H&P (View-Only) (Signed)
Daily Rounding Note  01/08/2021, 11:37 AM  LOS: 0 days   SUBJECTIVE:   Chief complaint: dark stools.  anemia   Stools less dark, more deep brown today.  Does not want to have another EGD. C/o hoarseness after previous EGDs.  Was really hoping he could go home today.   Frustrated that he is still bleeding after being treated for same earlier this month.   Also tells me that his PCP NP Dewaine Oats at [facility] senior care is making arrangements for him to be seen by [facility] but no confirmed appt yet.    OBJECTIVE:         Vital signs in last 24 hours:    Temp:  [97.9 F (36.6 C)-98.4 F (36.9 C)] 97.9 F (36.6 C) (03/18 0905) Pulse Rate:  [89-98] 89 (03/18 0905) Resp:  [16-20] 20 (03/18 0905) BP: (126-148)/(59-69) 126/69 (03/18 0905) SpO2:  [96 %-100 %] 97 % (03/18 0905) Weight:  [78.5 kg] 78.5 kg (03/17 1932) Last BM Date: 01/08/21 Filed Weights   01/07/21 1932  Weight: 78.5 kg   General: NAD.  Not ill looking   Heart: RRR Chest: clear bil.  No dyspnea or cough.  Vocal quality mildly raspy/rough. Abdomen: soft, NT, ND.  No HSM, masses, bruits, hernias.  Active BS  Extremities: no CCE Neuro/Psych:  Oriented x 3.  Alert.    Intake/Output from previous day: 03/17 0701 - 03/18 0700 In: 120 [P.O.:120] Out: 0   Intake/Output this shift: Total I/O In: 120 [P.O.:120] Out: -   Lab Results: Recent Labs    01/07/21 1044  WBC 5.7  HGB 8.0*  HCT 26.2*  PLT 146*   BMET Recent Labs    01/07/21 1044  NA 136  K 4.3  CL 95*  CO2 30  GLUCOSE 128*  BUN 64*  CREATININE 5.19*  CALCIUM 9.3   LFT Recent Labs    01/07/21 1044  PROT 6.5  ALBUMIN 3.1*  AST 21  ALT 13  ALKPHOS 86  BILITOT 0.6   PT/INR No results for input(s): LABPROT, INR in the last 72 hours. Hepatitis Panel No results for input(s): HEPBSAG, HCVAB, HEPAIGM, HEPBIGM in the last 72 hours.  Studies/Results: No results found.   Scheduled  Meds: . acetaminophen  1,000 mg Oral TID  . allopurinol  100 mg Oral Daily  . bictegravir-emtricitabine-tenofovir AF  1 tablet Oral Daily  . calcitRIOL  3 mcg Oral Q M,W,F-HD  . carvedilol  3.125 mg Oral BID  . Chlorhexidine Gluconate Cloth  6 each Topical Q0600  . folic acid  1 mg Oral Daily  . gabapentin  300 mg Oral Daily  . insulin aspart  0-6 Units Subcutaneous TID WC  . isosorbide mononitrate  30 mg Oral Daily  . latanoprost  1 drop Both Eyes QHS  . multivitamin  1 tablet Oral Daily  . pantoprazole  40 mg Oral BID  . timolol  1 drop Both Eyes Daily  . umeclidinium-vilanterol  1 puff Inhalation Daily   Continuous Infusions: PRN Meds:.colchicine, diclofenac Sodium, nitroGLYCERIN, polyethylene glycol   ASSESMENT:   *  Recurrent, acute on chronic blood loss anemia.   Multiple endos in last 2 years with clipping, APC of bleeding and non-bleeding AVMs.  Latest EGD w clipping of (? AVM) at nodule at base of duodenal bulb.   Fup Hgb pndg  Protonix 40 mg po bid in place.    *   Thrombocytopenia.  Platlets 104 >> 146  *   ESRD.  HD MWF  *    HIV on Bitkarvy.    *   Chronic Brilinta for 11/2019 cardiac DES.  Cards APP note of 3/17 is still not cosigned but mentions ok to hold Brilinta, and replace w ASA. From GI standpoint prefer permanent discontinution of Brilinta but cards MD needs to ok this.     PLAN   *   Upper endoscopy tomorrow, pt reluctantly agreeing to proceed.  NPO after midnight.    *   Await Cards MD opinion re Brilinta.    Azucena Freed  01/08/2021, 11:37 AM Phone [phone]  GI ATTENDING  Interval history data reviewed.  Agree with interval progress note.  Patient in dialysis.  Plans for upper endoscopy tomorrow to rule out recurrent AVMs or other pathology.  At this point, given the recurrent nature of his bleeding that requires hospitalization, wondering if it would be reasonable to stop his Brilinta since it has been over 1 year since his drug-eluting  stent was placed.  This will be left solely to cardiology discretion.  Patient is high risk for his endoscopic procedure.  Docia Chuck. Geri [place]in[place]., M.D. [facility] Division of Gastroenterology

## 2021-01-08 NOTE — Procedures (Signed)
Patient seen and examined on Hemodialysis. Hgb 6.9, getting 1 u pRBCs  BP 133/62   Pulse 79   Temp 97.6 F (36.4 C) (Oral)   Resp 20   Ht 6\' 2"  (1.88 m)   Wt 76.7 kg   SpO2 100%   BMI 21.71 kg/m   QB 400 mL/ min, UF goal even.  Tolerating treatment without complaints at this time.   Madelon Lips MD [facility] 4:23 PM

## 2021-01-08 NOTE — Patient Outreach (Addendum)
[facility]) Care Management  01/08/2021  Jacob [name] May 18, 1951 [id number]   [place] coordination- [facility] multidisciplinary care discussion (MCD)- covering for [place] RN CM  Pt reviewed in with [facility] MCD team and [place] healthcare staff Not active with [facility] Pt not preferring [facility] services Pt noted re admitted on 01/07/21 related to GI bleed  No recommendations, No follow up   [place]. Lavina Hamman, RN, BSN, [place] Coordinator Office number [phone] Mobile number [phone]  Main [facility] number [phone] Fax number [phone]

## 2021-01-08 NOTE — [place] Notes (Signed)
Pt.'s Hgb level is 6.9, Nephrologist ordered 1 Unit .

## 2021-01-09 ENCOUNTER — Encounter (HOSPITAL_COMMUNITY): Payer: Self-pay | Admitting: Internal Medicine

## 2021-01-09 ENCOUNTER — Observation Stay (HOSPITAL_COMMUNITY): Payer: Medicare Other | Admitting: Certified Registered Nurse Anesthetist

## 2021-01-09 ENCOUNTER — Encounter (HOSPITAL_COMMUNITY)
Admission: EM | Disposition: A | Discharge status: Home / Self Care | Payer: Self-pay | Source: Home / Self Care | Attending: Emergency Medicine

## 2021-01-09 DIAGNOSIS — K449 Diaphragmatic hernia without obstruction or gangrene: Secondary | ICD-10-CM | POA: Diagnosis not present

## 2021-01-09 DIAGNOSIS — Z992 Dependence on renal dialysis: Secondary | ICD-10-CM | POA: Diagnosis not present

## 2021-01-09 DIAGNOSIS — I10 Essential (primary) hypertension: Secondary | ICD-10-CM | POA: Diagnosis not present

## 2021-01-09 DIAGNOSIS — E1142 Type 2 diabetes mellitus with diabetic polyneuropathy: Secondary | ICD-10-CM | POA: Diagnosis not present

## 2021-01-09 DIAGNOSIS — B20 Human immunodeficiency virus [HIV] disease: Secondary | ICD-10-CM

## 2021-01-09 DIAGNOSIS — N186 End stage renal disease: Secondary | ICD-10-CM | POA: Diagnosis not present

## 2021-01-09 DIAGNOSIS — K921 Melena: Secondary | ICD-10-CM | POA: Diagnosis not present

## 2021-01-09 DIAGNOSIS — Z794 Long term (current) use of insulin: Secondary | ICD-10-CM

## 2021-01-09 DIAGNOSIS — I5042 Chronic combined systolic (congestive) and diastolic (congestive) heart failure: Secondary | ICD-10-CM | POA: Diagnosis not present

## 2021-01-09 DIAGNOSIS — K31811 Angiodysplasia of stomach and duodenum with bleeding: Secondary | ICD-10-CM | POA: Diagnosis not present

## 2021-01-09 DIAGNOSIS — E875 Hyperkalemia: Secondary | ICD-10-CM | POA: Diagnosis not present

## 2021-01-09 DIAGNOSIS — D631 Anemia in chronic kidney disease: Secondary | ICD-10-CM | POA: Diagnosis not present

## 2021-01-09 DIAGNOSIS — I509 Heart failure, unspecified: Secondary | ICD-10-CM

## 2021-01-09 DIAGNOSIS — Z7902 Long term (current) use of antithrombotics/antiplatelets: Secondary | ICD-10-CM | POA: Diagnosis not present

## 2021-01-09 DIAGNOSIS — D62 Acute posthemorrhagic anemia: Secondary | ICD-10-CM | POA: Diagnosis not present

## 2021-01-09 DIAGNOSIS — I2510 Atherosclerotic heart disease of native coronary artery without angina pectoris: Secondary | ICD-10-CM | POA: Diagnosis not present

## 2021-01-09 DIAGNOSIS — E1122 Type 2 diabetes mellitus with diabetic chronic kidney disease: Secondary | ICD-10-CM | POA: Diagnosis not present

## 2021-01-09 DIAGNOSIS — I132 Hypertensive heart and chronic kidney disease with heart failure and with stage 5 chronic kidney disease, or end stage renal disease: Secondary | ICD-10-CM | POA: Diagnosis not present

## 2021-01-09 DIAGNOSIS — J449 Chronic obstructive pulmonary disease, unspecified: Secondary | ICD-10-CM | POA: Diagnosis not present

## 2021-01-09 DIAGNOSIS — Z20822 Contact with and (suspected) exposure to covid-19: Secondary | ICD-10-CM | POA: Diagnosis not present

## 2021-01-09 DIAGNOSIS — E782 Mixed hyperlipidemia: Secondary | ICD-10-CM | POA: Diagnosis not present

## 2021-01-09 HISTORY — PX: ESOPHAGOGASTRODUODENOSCOPY (EGD) WITH PROPOFOL: SHX5813

## 2021-01-09 HISTORY — PX: HOT HEMOSTASIS: SHX5433

## 2021-01-09 LAB — BPAM RBC
Blood Product Expiration Date: 202204122359
ISSUE DATE / TIME: 202203181622
Unit Type and Rh: 6200

## 2021-01-09 LAB — CBC
HCT: 23.2 % — ABNORMAL LOW (ref 39.0–52.0)
Hemoglobin: 7.6 g/dL — ABNORMAL LOW (ref 13.0–17.0)
MCH: 30.6 pg (ref 26.0–34.0)
MCHC: 32.8 g/dL (ref 30.0–36.0)
MCV: 93.5 fL (ref 80.0–100.0)
Platelets: 122 10*3/uL — ABNORMAL LOW (ref 150–400)
RBC: 2.48 MIL/uL — ABNORMAL LOW (ref 4.22–5.81)
RDW: 15.9 % — ABNORMAL HIGH (ref 11.5–15.5)
WBC: 4.6 10*3/uL (ref 4.0–10.5)
nRBC: 0 % (ref 0.0–0.2)

## 2021-01-09 LAB — TYPE AND SCREEN
ABO/RH(D): A POS
Antibody Screen: NEGATIVE
Unit division: 0

## 2021-01-09 LAB — BASIC METABOLIC PANEL
Anion gap: 7 (ref 5–15)
BUN: 31 mg/dL — ABNORMAL HIGH (ref 8–23)
CO2: 30 mmol/L (ref 22–32)
Calcium: 8.2 mg/dL — ABNORMAL LOW (ref 8.9–10.3)
Chloride: 99 mmol/L (ref 98–111)
Creatinine, Ser: 3.75 mg/dL — ABNORMAL HIGH (ref 0.61–1.24)
GFR, Estimated: 16 mL/min — ABNORMAL LOW (ref >60)
Glucose, Bld: 121 mg/dL — ABNORMAL HIGH (ref 70–99)
Potassium: 3.6 mmol/L (ref 3.5–5.1)
Sodium: 136 mmol/L (ref 135–145)

## 2021-01-09 LAB — PHOSPHORUS: Phosphorus: 4.9 mg/dL — ABNORMAL HIGH (ref 2.5–4.6)

## 2021-01-09 LAB — GLUCOSE, CAPILLARY
Glucose-Capillary: 127 mg/dL — ABNORMAL HIGH (ref 70–99)
Glucose-Capillary: 131 mg/dL — ABNORMAL HIGH (ref 70–99)

## 2021-01-09 SURGERY — ESOPHAGOGASTRODUODENOSCOPY (EGD) WITH PROPOFOL
Anesthesia: Monitor Anesthesia Care

## 2021-01-09 MED ORDER — LIDOCAINE HCL (CARDIAC) PF 100 MG/5ML IV SOSY
PREFILLED_SYRINGE | INTRAVENOUS | Status: DC | PRN
  Administered 2021-01-09: 60 mg via INTRATRACHEAL

## 2021-01-09 MED ORDER — PROPOFOL 500 MG/50ML IV EMUL
INTRAVENOUS | Status: DC | PRN
  Administered 2021-01-09: 100 ug/kg/min via INTRAVENOUS

## 2021-01-09 MED ORDER — PROPOFOL 10 MG/ML IV BOLUS
INTRAVENOUS | Status: DC | PRN
  Administered 2021-01-09 (×7): 20 mg via INTRAVENOUS

## 2021-01-09 MED ORDER — LANTHANUM CARBONATE 500 MG PO CHEW: 1000.0000 mg | CHEWABLE_TABLET | Freq: Three times a day (TID) | ORAL | Status: DC

## 2021-01-09 MED ORDER — LACTATED RINGERS IV SOLN: INTRAVENOUS | Status: DC | PRN

## 2021-01-09 SURGICAL SUPPLY — 14 items

## 2021-01-09 NOTE — Transfer of Care (Signed)
Immediate Anesthesia Transfer of Care Note  Patient: Jacob [name]  Procedure(s) Performed: ESOPHAGOGASTRODUODENOSCOPY (EGD) WITH PROPOFOL (N/A ) HOT HEMOSTASIS (ARGON PLASMA COAGULATION/BICAP) (N/A )  Patient Location: PACU  Anesthesia Type:MAC  Level of Consciousness: drowsy  Airway & Oxygen Therapy: Patient Spontanous Breathing  Post-op Assessment: Report given to RN and Post -op Vital signs reviewed and stable  Post vital signs: Reviewed and stable  Last Vitals:  Vitals Value Taken Time  BP    Temp    Pulse 71 01/09/21 0925  Resp 18 01/09/21 0925  SpO2 100 % 01/09/21 0925  Vitals shown include unvalidated device data.  Last Pain:  Vitals:   01/09/21 0810  TempSrc: Temporal  PainSc: 0-No pain         Complications: No complications documented.

## 2021-01-09 NOTE — Op Note (Signed)
[facility] Patient Name: Jacob [name] Procedure Date : 01/09/2021 MRN: [id number] Attending MD: Docia Chuck. Henrene Pastor , MD Date of Birth: 1951-08-28 CSN: [id number] Age: 70 Admit Type: Inpatient Procedure:                Upper GI endoscopy with control of bleeding?"APC Indications:              Melena Providers:                Docia Chuck. Henrene Pastor, MD, Particia Nearing, RN, Elspeth Cho                            Tech., Technician, Clearnce Sorrel, CRNA Referring MD:             [facility] Medicines:                Monitored Anesthesia Care Complications:            No immediate complications. Estimated Blood Loss:     Estimated blood loss: none. Procedure:                Pre-Anesthesia Assessment:                           - Prior to the procedure, a History and Physical                            was performed, and patient medications and                            allergies were reviewed. The patient's tolerance of                            previous anesthesia was also reviewed. The risks                            and benefits of the procedure and the sedation                            options and risks were discussed with the patient.                            All questions were answered, and informed consent                            was obtained. Prior Anticoagulants: The patient has                            taken Plavix (clopidogrel), last dose was 3 days                            prior to procedure. ASA Grade Assessment: III - A                            patient with severe systemic disease. After  reviewing the risks and benefits, the patient was                            deemed in satisfactory condition to undergo the                            procedure.                           After obtaining informed consent, the endoscope was                            passed under direct vision. Throughout the                             procedure, the patient's blood pressure, pulse, and                            oxygen saturations were monitored continuously. The                            GIF-H190 ([id number]) Olympus gastroscope was                            introduced through the mouth, and advanced to the                            third part of duodenum. The upper GI endoscopy was                            accomplished without difficulty. The patient                            tolerated the procedure well. Scope In: Scope Out: Findings:      The esophagus was normal.      Three 1 to 4 mm angiodysplastic lesions with bleeding were found in the       stomach. There was fresh minor amounts of pooled blood in the fundus.       This was cleared. All identified AVMs were treated with coagulation for       hemostasis using argon plasma, which was was successful.      The stomach was normal except for small sliding hiatal hernia.      The examined duodenum was normal to the third portion.      The cardia and gastric fundus were normal on retroflexion. Impression:               1. Bleeding gastric AVMs status post successful APC                            therapy                           2. Otherwise unremarkable upper endoscopy. Recommendation:           1. Resume diet  2. Observe for any evidence of rebleeding                           3. Transfuse as needed                           4. Hopefully, off Brilinta, he will have a more                            sustained response.                           We will follow Procedure Code(s):        --- Professional ---                           [id number], Esophagogastroduodenoscopy, flexible,                            transoral; with control of bleeding, any method Diagnosis Code(s):        --- Professional ---                           B63.893, Angiodysplasia of stomach and duodenum                            with bleeding                            K92.1, Melena (includes Hematochezia) CPT copyright 2019 American Medical Association. All rights reserved. The codes documented in this report are preliminary and upon coder review may  be revised to meet current compliance requirements. Docia Chuck. Henrene Pastor, MD 01/09/2021 9:36:25 AM This report has been signed electronically. Number of Addenda: 0

## 2021-01-09 NOTE — Anesthesia Postprocedure Evaluation (Signed)
Anesthesia Post Note  Patient: Jacob [name]  Procedure(s) Performed: ESOPHAGOGASTRODUODENOSCOPY (EGD) WITH PROPOFOL (N/A ) HOT HEMOSTASIS (ARGON PLASMA COAGULATION/BICAP) (N/A )     Patient location during evaluation: Endoscopy Anesthesia Type: MAC Level of consciousness: awake and alert Pain management: pain level controlled Vital Signs Assessment: post-procedure vital signs reviewed and stable Respiratory status: spontaneous breathing, nonlabored ventilation, respiratory function stable and patient connected to nasal cannula oxygen Cardiovascular status: stable and blood pressure returned to baseline Postop Assessment: no apparent nausea or vomiting Anesthetic complications: no   No complications documented.  Last Vitals:  Vitals:   01/09/21 0956 01/09/21 1016  BP: (!) 121/57 137/69  Pulse: 78 78  Resp: 18 18  Temp: 36.6 C (!) 36.4 C  SpO2: 100% 100%    Last Pain:  Vitals:   01/09/21 1016  TempSrc: Oral  PainSc:                  March Rummage Zackrey Dyar

## 2021-01-09 NOTE — [place] Notes (Signed)
Jacob [name] Progress Note   Subjective:   S/p endoscopy this morning, showed bleeding gastric AVMs treated with APC therapy. Wants to go home. No SOB, CP, dizziness.   Objective Vitals:   01/09/21 0941 01/09/21 0946 01/09/21 0956 01/09/21 1016  BP: (!) 101/53 (!) 107/51 (!) 121/57 137/69  Pulse: 73 71 78 78  Resp: 20 13 18 18   Temp:   97.9 F (36.6 C) (!) 97.5 F (36.4 C)  TempSrc:    Oral  SpO2: 100% 100% 100% 100%  Weight:      Height:       Physical Exam General: WDWN male, alert and in NAD Heart: RRR, no murmur, rubs or gallops Lungs: CTA bilaterally without wheezing, rhonchi or rales Abdomen: Soft, non-tender, non-distended, +BS Extremities: No edema b/l lower extremities Dialysis Access: LUE AVF + t/b  Additional Objective Labs: Basic Metabolic Panel: Recent Labs  Lab 01/07/21 1044 01/08/21 1335 01/09/21 0257  NA 136 137 136  K 4.3 4.1 3.6  CL 95* 100 99  CO2 30 26 30   GLUCOSE 128* 151* 121*  BUN 64* 73* 31*  CREATININE 5.19* 6.28* 3.75*  CALCIUM 9.3 8.7* 8.2*  PHOS  --  4.7* 4.9*   Liver Function Tests: Recent Labs  Lab 01/07/21 1044 01/08/21 1335  AST 21  --   ALT 13  --   ALKPHOS 86  --   BILITOT 0.6  --   PROT 6.5  --   ALBUMIN 3.1* 2.7*   CBC: Recent Labs  Lab 01/07/21 1044 01/08/21 1300 01/09/21 0257  WBC 5.7 4.5 4.6  HGB 8.0* 6.9* 7.6*  HCT 26.2* 21.8* 23.2*  MCV 99.2 97.3 93.5  PLT 146* 145* 122*   Blood Culture    Component Value Date/Time   SDES BLOOD RIGHT HAND 07/25/2019 1421   SDES BLOOD RIGHT HAND 07/25/2019 1421   SPECREQUEST  07/25/2019 1421    BOTTLES DRAWN AEROBIC ONLY Blood Culture adequate volume   SPECREQUEST  07/25/2019 1421    BOTTLES DRAWN AEROBIC ONLY Blood Culture adequate volume   CULT  07/25/2019 1421    NO GROWTH 5 DAYS Performed at [place] [address], [place], [place] [address]    CULT  07/25/2019 1421    NO GROWTH 5 DAYS Performed at [facility], [place] [address] [place], [place] [address]    REPTSTATUS 07/30/2019 FINAL 07/25/2019 1421   REPTSTATUS 07/30/2019 FINAL 07/25/2019 1421   CBG: Recent Labs  Lab 01/08/21 1139 01/08/21 1801 01/08/21 2058 01/09/21 0744 01/09/21 0928  GLUCAP 207* 76 146* 127* 131*   Medications:  . sodium chloride   Intravenous Once  . acetaminophen  1,000 mg Oral TID  . allopurinol  100 mg Oral Daily  . bictegravir-emtricitabine-tenofovir AF  1 tablet Oral Daily  . calcitRIOL  3 mcg Oral Q M,W,F-HD  . carvedilol  3.125 mg Oral BID  . Chlorhexidine Gluconate Cloth  6 each Topical Q0600  . folic acid  1 mg Oral Daily  . gabapentin  300 mg Oral Daily  . insulin aspart  0-6 Units Subcutaneous TID WC  . isosorbide mononitrate  30 mg Oral Daily  . latanoprost  1 drop Both Eyes QHS  . multivitamin  1 tablet Oral Daily  . pantoprazole  40 mg Oral BID  . timolol  1 drop Both Eyes Daily  . umeclidinium-vilanterol  1 puff Inhalation Daily    Outpatient Dialysis Orders: Center: [facility] on MWF.  180NRE, 4 hous, BFR 400, DFR 500 (due to dialysate shortage), EDW 77kg, 2K, 2Ca, AVF 15g No heparin Mircera 111mcg q 2 weeks- last dose 01/06/21 Calcitriol 3.71mcg PO q HD Phosphorus binder: Renvela 800mg , take 3 tabs PO TID with meals  Assessment/Plan: 1. Recurrent GI bleed: Pt with history of recurrent bleeds due to AVMs. S/p EGD this AM with treatment of bleeding AVMs.  2. ESRD: Continue MWF schedule. Next HD will be [place] 3. HTN/volume:  Blood pressure controlled. No evidence of volume overload on exam today.  4. Anemia: Received 1 unit PRBC on HD on 3/18 and AVMs treated this morning. Hgb 7.6 today. Continue ESA.  5. Secondary hyperparathyroidism:  Corrected calcium high, VDRA dose reduced on 3/18. Renvela can rarely cause deposition of sevelamer crystals in the GI tract leading to GI bleed and abdominal pain, though these were not noted on recent EGD. Phos is 4.9. Due to recurrent admissions for GI  bleeds, will trial lanthanum as binder.  6. Nutrition:  Renal/carb modified with fluid restrictions. 7. HIV: On biktarvy 8. T2DM: Contiune insulin per primary team 9. CAD:  On carvedilol and imdur. Appears brilinta is on hold due to GI bleeds.    Anice Paganini, PA-C 01/09/2021, 10:30 AM  [name] Pager: [phone]

## 2021-01-09 NOTE — Discharge Summary (Signed)
Physician Discharge Summary  Jacob [name] KVQ:[id number] DOB: December 04, 1950 DOA: 01/07/2021   AMA DISCHARGE   PCP: [name], NP  Admit date: 01/07/2021 Discharge date: 01/09/2021  Admitted From: Observation Disposition: AMA DISCHARGE  Recommendations for Outpatient Follow-up:  1. Follow up with PCP in 1-2 weeks 2. F/UYP WITH GI AS DISCUSSED  Home Health:No Equipment/Devices:NO NEW EQUIPMENT  Discharge Condition:Fair CODE STATUS:Full code Diet recommendation: PT LEFT AMA-UNABLE TO DISCUSS  Brief/Interim Summary:  AMA DISCHARGE  Jacob Belisle Newbyis an 70 y.o.malewith past medical history significant for ESRD on HD MWF, HIV, COPD, diabetes mellitus type 2,, CAD on Brilinta and history of recurrent GI bleed secondary to AVM still with melena.. Of note patient was recently treated in house 2 weeks ago for bloody stools. He had signed out [facility] however had returned after several more stools. Patient had AVMs cauterized in December 2021 however his need for Brilinta for his CAD complicated issues of GI bleeding. EGD done on 3/ 1 showed duodenal bulb nodule with steady nonpulsatile bleeding and was treated with APC and clipping x4. He had needed 5 units of PRBCs during that time. Patient was discharged home on Protonix with Brilinta being held for 5 days.  In the ED, patient was noted to be hemodynamically stable. With hemoglobin of 8.0, increased from 7.1 on discharge 12/24/2020. Plan was initially to discharge patient home as he was hemodynamically stable without need for transfusion however low [facility] who follows the patient requested admission for endoscopy, possible capsule endoscopy as warranted.  Pt underwent EGD this AM.  I discussed procedure with Dr Henrene Pastor who wanted to watch pt overnight.  Pt refused and left AMA  While in the hospital he was treated as follows; Recurrent GI bleed/melena Fecal occult blood was positive.  She has remained  hemodynamically stable.  Hemoglobin of 7.6 today.  GI has seen the patient and performed EGD 01/09/2021.  Continue Protonix.  ESRD on Monday Wednesday Friday Nephrology on board for hemodialysis.  HIV Continue Biktarvy   Diabetes mellitus type 2. Continue sliding scale insulin Accu-Cheks, diabetic diet.  Patient uses lispro 20 units 3 times a day as needed for blood glucose level more than 150 at home.  Essential HTN Continue carvedilol closely monitor blood pressure.  Coronary artery disease. No acute chest pain.  Continue carvedilol, Imdur.  Brilinta on hold - pt left AMA, unable to duiscuss furthert, med rec completed for administartive/EPIC requirements but I was unable to discuss anticoagulation with pt as he left abruptly. Tried calling, N/A  Congestive heart failure. Continue Coreg.  Fluid management as per hemodialysis.  No acute decompensation.  Gout Continue colchicine and allopurinol   COPD continue Anoro Ellipta.  Currently compensated.  Glaucoma Continue latanoprost and timolol   Discharge Diagnoses:  Principal Problem:   Melena Active Problems:   Human immunodeficiency virus (HIV) disease ([place])   Chronic combined systolic (congestive) and diastolic (congestive) heart failure (HCC)   HTN (hypertension)   DM (diabetes mellitus) ([place])   Type 2 diabetes mellitus with diabetic polyneuropathy, with long-term current use of insulin (HCC)   CHF (congestive heart failure) (HCC)   Chronic obstructive pulmonary disease (HCC)   Acute blood loss anemia   AVM (arteriovenous malformation) of duodenum, acquired   Gastric hemorrhage due to angiodysplasia of stomach    Discharge Instructions PT LEFT AMA, UNABLE TO DISCUSS MED REC WITH PT   Allergies  Allergen Reactions  . Augmentin [Amoxicillin-Pot Clavulanate] Diarrhea    Severe  diarrhea  . Mucinex Fast-Max Other (See Comments)    Intensive sweating   . Amphetamines Other (See Comments)    Unknown  reaction    Consultations:  GI, NEPHROLOGY   Procedures/Studies: DG Chest Portable 1 View  Result Date: 12/11/2020 CLINICAL DATA:  Shortness of breath EXAM: PORTABLE CHEST 1 VIEW COMPARISON:  11/24/2020 FINDINGS: Cardiomegaly status post median sternotomy and CABG. Both lungs are clear. The visualized skeletal structures are unremarkable. IMPRESSION: Cardiomegaly without acute abnormality of the lungs in AP portable projection. Electronically Signed   By: Eddie Candle M.D.   On: 12/11/2020 13:05   DG Abdomen Acute W/Chest  Result Date: 12/21/2020 CLINICAL DATA:  Abdominal pain, vomiting EXAM: DG ABDOMEN ACUTE WITH 1 VIEW CHEST COMPARISON:  07/23/2019 FINDINGS: Prior CABG. Cardiomegaly. Aortic atherosclerosis. Areas of scarring in the upper lobes bilaterally. No acute confluent opacities or effusions. Prior cholecystectomy. Nonobstructive bowel gas pattern. No free air or organomegaly. Vascular calcifications noted. No visible aneurysm. IMPRESSION: No bowel obstruction or free air. Cardiomegaly.  No acute cardiopulmonary disease. Electronically Signed   By: Rolm Baptise M.D.   On: 12/21/2020 09:41   DG Foot Complete Right  Result Date: 12/10/2020 Please see detailed radiograph report in office note.      Subjective: LEFT AMA, NO OBVIOUS COMPLICATIONS IN THE BRIEF PERIOD FOLLOWING EGD  Discharge Exam: Vitals:   01/09/21 0956 01/09/21 1016  BP: (!) 121/57 137/69  Pulse: 78 78  Resp: 18 18  Temp: 97.9 F (36.6 C) (!) 97.5 F (36.4 C)  SpO2: 100% 100%   Vitals:   01/09/21 0941 01/09/21 0946 01/09/21 0956 01/09/21 1016  BP: (!) 101/53 (!) 107/51 (!) 121/57 137/69  Pulse: 73 71 78 78  Resp: 20 13 18 18   Temp:   97.9 F (36.6 C) (!) 97.5 F (36.4 C)  TempSrc:    Oral  SpO2: 100% 100% 100% 100%  Weight:      Height:        General: Pt is alert, awake, not in acute distress Cardiovascular: RRR, S1/S2 +, no rubs, no gallops Respiratory: CTA bilaterally, no wheezing, no  rhonchi Abdominal: Soft, NT, ND, bowel sounds + Extremities: no edema, no cyanosis    The results of significant diagnostics from this hospitalization (including imaging, microbiology, ancillary and laboratory) are listed below for reference.     Microbiology: Recent Results (from the past 240 hour(s))  SARS CORONAVIRUS 2 (TAT 6-24 HRS) Nasopharyngeal Nasopharyngeal Swab     Status: None   Collection Time: 01/07/21  3:10 PM   Specimen: Nasopharyngeal Swab  Result Value Ref Range Status   SARS Coronavirus 2 NEGATIVE NEGATIVE Final    Comment: (NOTE) SARS-CoV-2 target nucleic acids are NOT DETECTED.  The SARS-CoV-2 RNA is generally detectable in upper and lower respiratory specimens during the acute phase of infection. Negative results do not preclude SARS-CoV-2 infection, do not rule out co-infections with other pathogens, and should not be used as the sole basis for treatment or other patient management decisions. Negative results must be combined with clinical observations, patient history, and epidemiological information. The expected result is Negative.  Fact Sheet for Patients: SugarRoll.be  Fact Sheet for Healthcare Providers: https://www.woods-mathews.com/  This test is not yet approved or cleared by the [place] FDA and  has been authorized for detection and/or diagnosis of SARS-CoV-2 by FDA under an Emergency Use Authorization (EUA). This EUA will remain  in effect (meaning this test can be used) for the duration of the COVID-19  declaration under Se ction 564(b)(1) of the Act, 21 U.S.C. section 360bbb-3(b)(1), unless the authorization is terminated or revoked sooner.  Performed at [facility], [place] [address] [place], [place] [address]      Labs: BNP (last 3 results) No results for input(s): BNP in the last 8760 hours. Basic Metabolic Panel: Recent Labs  Lab 01/07/21 1044 01/08/21 1106 01/08/21 1335  01/09/21 0257  NA 136  --  137 136  K 4.3  --  4.1 3.6  CL 95*  --  100 99  CO2 30  --  26 30  GLUCOSE 128*  --  151* 121*  BUN 64*  --  73* 31*  CREATININE 5.19*  --  6.28* 3.75*  CALCIUM 9.3  --  8.7* 8.2*  MG  --  2.2  --   --   PHOS  --   --  4.7* 4.9*   Liver Function Tests: Recent Labs  Lab 01/07/21 1044 01/08/21 1335  AST 21  --   ALT 13  --   ALKPHOS 86  --   BILITOT 0.6  --   PROT 6.5  --   ALBUMIN 3.1* 2.7*   No results for input(s): LIPASE, AMYLASE in the last 168 hours. No results for input(s): AMMONIA in the last 168 hours. CBC: Recent Labs  Lab 01/07/21 1044 01/08/21 1300 01/09/21 0257  WBC 5.7 4.5 4.6  HGB 8.0* 6.9* 7.6*  HCT 26.2* 21.8* 23.2*  MCV 99.2 97.3 93.5  PLT 146* 145* 122*   Cardiac Enzymes: No results for input(s): CKTOTAL, CKMB, CKMBINDEX, TROPONINI in the last 168 hours. BNP: Invalid input(s): POCBNP CBG: Recent Labs  Lab 01/08/21 1139 01/08/21 1801 01/08/21 2058 01/09/21 0744 01/09/21 0928  GLUCAP 207* 76 146* 127* 131*   D-Dimer No results for input(s): DDIMER in the last 72 hours. Hgb A1c No results for input(s): HGBA1C in the last 72 hours. Lipid Profile No results for input(s): CHOL, HDL, LDLCALC, TRIG, CHOLHDL, LDLDIRECT in the last 72 hours. Thyroid function studies No results for input(s): TSH, T4TOTAL, T3FREE, THYROIDAB in the last 72 hours.  Invalid input(s): FREET3 Anemia work up No results for input(s): VITAMINB12, FOLATE, FERRITIN, TIBC, IRON, RETICCTPCT in the last 72 hours. Urinalysis    Component Value Date/Time   COLORURINE YELLOW 10/16/2020 2136   APPEARANCEUR CLEAR 10/16/2020 2136   LABSPEC 1.016 10/16/2020 2136   PHURINE 5.0 10/16/2020 2136   GLUCOSEU NEGATIVE 10/16/2020 2136   GLUCOSEU NEG mg/dL 09/20/2010 2058   HGBUR SMALL (A) 10/16/2020 2136   HGBUR negative 05/31/2010 0948   BILIRUBINUR NEGATIVE 10/16/2020 2136   [place] NEGATIVE 10/16/2020 2136   PROTEINUR 30 (A) 10/16/2020 2136    UROBILINOGEN 0.2 06/24/2015 2336   NITRITE NEGATIVE 10/16/2020 2136   LEUKOCYTESUR NEGATIVE 10/16/2020 2136   Sepsis Labs Invalid input(s): PROCALCITONIN,  WBC,  LACTICIDVEN Microbiology Recent Results (from the past 240 hour(s))  SARS CORONAVIRUS 2 (TAT 6-24 HRS) Nasopharyngeal Nasopharyngeal Swab     Status: None   Collection Time: 01/07/21  3:10 PM   Specimen: Nasopharyngeal Swab  Result Value Ref Range Status   SARS Coronavirus 2 NEGATIVE NEGATIVE Final    Comment: (NOTE) SARS-CoV-2 target nucleic acids are NOT DETECTED.  The SARS-CoV-2 RNA is generally detectable in upper and lower respiratory specimens during the acute phase of infection. Negative results do not preclude SARS-CoV-2 infection, do not rule out co-infections with other pathogens, and should not be used as the sole basis for treatment or  other patient management decisions. Negative results must be combined with clinical observations, patient history, and epidemiological information. The expected result is Negative.  Fact Sheet for Patients: SugarRoll.be  Fact Sheet for Healthcare Providers: https://www.woods-mathews.com/  This test is not yet approved or cleared by the [place] FDA and  has been authorized for detection and/or diagnosis of SARS-CoV-2 by FDA under an Emergency Use Authorization (EUA). This EUA will remain  in effect (meaning this test can be used) for the duration of the COVID-19 declaration under Se ction 564(b)(1) of the Act, 21 U.S.C. section 360bbb-3(b)(1), unless the authorization is terminated or revoked sooner.  Performed at [facility], [place] [address], [place], [place] [address]      Time coordinating discharge: Over 30 minutes  SIGNED:   Nicolette Bang, MD  [facility] 01/09/2021, 12:39 PM Pager   If 7PM-7AM, please contact night-coverage [email or web address] Password [email or web address]

## 2021-01-09 NOTE — Anesthesia Preprocedure Evaluation (Addendum)
Anesthesia Evaluation  Patient identified by MRN, date of birth, ID band Patient awake    Reviewed: Allergy & Precautions, NPO status , Patient's Chart, lab work & pertinent test results  Airway Mallampati: II  TM Distance: >3 FB Neck ROM: Full    Dental no notable dental hx.    Pulmonary COPD, Current Smoker and Patient abstained from smoking.,    Pulmonary exam normal        Cardiovascular hypertension, Pt. on medications and Pt. on home beta blockers + CAD, + Past MI, + Cardiac Stents (02/21) and + CABG (X 2 in 05/04)   Rhythm:Regular Rate:Normal     Neuro/Psych negative neurological ROS  negative psych ROS   GI/Hepatic GERD  Medicated,(+)     substance abuse (+ test 12/01/20)  cocaine use, Hepatitis -, BGIB 2/2 AVM   Endo/Other  diabetes, Poorly Controlled, Type 2, Insulin Dependent  Renal/GU ESRF and DialysisRenal disease (HD M/W/F)  negative genitourinary   Musculoskeletal  (+) Arthritis , Osteoarthritis,    Abdominal (+)  Abdomen: soft. Bowel sounds: normal.  Peds  Hematology  (+) anemia , HIV,   Anesthesia Other Findings   Reproductive/Obstetrics                            Anesthesia Physical Anesthesia Plan  ASA: IV  Anesthesia Plan: MAC   Post-op Pain Management:    Induction: Intravenous  PONV Risk Score and Plan: 0 and Propofol infusion  Airway Management Planned: Simple Face Mask, Natural Airway and Nasal Cannula  Additional Equipment: None  Intra-op Plan:   Post-operative Plan:   Informed Consent: I have reviewed the patients History and Physical, chart, labs and discussed the procedure including the risks, benefits and alternatives for the proposed anesthesia with the patient or authorized representative who has indicated his/her understanding and acceptance.     Dental advisory given  Plan Discussed with: CRNA  Anesthesia Plan Comments: (ECHO  12/21: 1. Left ventricular ejection fraction, by estimation, is 40 to 45%. The  left ventricle has mildly decreased function. The left ventricle  demonstrates regional wall motion abnormalities (see scoring  diagram/findings for description). The left ventricular  internal cavity size was moderately dilated. There is moderate concentric  left ventricular hypertrophy. Left ventricular diastolic parameters are  consistent with Grade II diastolic dysfunction (pseudonormalization).  Elevated left atrial pressure.  2. Right ventricular systolic function is normal. The right ventricular  size is normal. There is normal pulmonary artery systolic pressure.  3. Left atrial size was moderately dilated.  4. The mitral valve is normal in structure. Mild mitral valve  regurgitation. No evidence of mitral stenosis.  5. The aortic valve is normal in structure. Aortic valve regurgitation is  not visualized. No aortic stenosis is present.  6. The inferior vena cava is dilated in size with >50% respiratory  variability, suggesting right atrial pressure of 8 mmHg.  Lab Results      Component                Value               Date                      WBC                      4.6  01/09/2021                HGB                      7.6 (L)             01/09/2021                HCT                      23.2 (L)            01/09/2021                MCV                      93.5                01/09/2021                PLT                      122 (L)             01/09/2021           Lab Results      Component                Value               Date                      NA                       136                 01/09/2021                K                        3.6                 01/09/2021                CO2                      30                  01/09/2021                GLUCOSE                  121 (H)             01/09/2021                BUN                      31 (H)               01/09/2021                CREATININE               3.75 (H)            01/09/2021                CALCIUM  8.2 (L)             01/09/2021                GFRNONAA                 16 (L)              01/09/2021                GFRAA                    10 (L)              12/15/2020          )       Anesthesia Quick Evaluation

## 2021-01-09 NOTE — Interval H&P Note (Signed)
History and Physical Interval Note:  01/09/2021 9:37 AM  Jacob [name]  has presented today for surgery, with the diagnosis of anemia, black stool.  The various methods of treatment have been discussed with the patient and family. After consideration of risks, benefits and other options for treatment, the patient has consented to  Procedure(s): ESOPHAGOGASTRODUODENOSCOPY (EGD) WITH PROPOFOL (N/A) HOT HEMOSTASIS (ARGON PLASMA COAGULATION/BICAP) (N/A) as a surgical intervention.  The patient's history has been reviewed, patient examined, no change in status, stable for surgery.  I have reviewed the patient's chart and labs.  Questions were answered to the patient's satisfaction.     Scarlette Shorts

## 2021-01-10 ENCOUNTER — Encounter (HOSPITAL_COMMUNITY): Payer: Self-pay | Admitting: Internal Medicine

## 2021-01-11 DIAGNOSIS — N186 End stage renal disease: Secondary | ICD-10-CM | POA: Diagnosis not present

## 2021-01-11 DIAGNOSIS — Z992 Dependence on renal dialysis: Secondary | ICD-10-CM | POA: Diagnosis not present

## 2021-01-11 DIAGNOSIS — N2581 Secondary hyperparathyroidism of renal origin: Secondary | ICD-10-CM | POA: Diagnosis not present

## 2021-01-11 NOTE — Telephone Encounter (Signed)
Transition of care contact from inpatient facility  Date of discharge: 01/09/21 Date of contact: 3/22/ Method: Phone Spoke to: Patient  Patient contacted to discuss transition of care from recent inpatient hospitalization. Patient was admitted to [facility] from 01/07/21 to 01/09/21... with discharge diagnosis of .Recurrent GI Bleed.  Medication changes were reviewed.  Patient will follow up with his/her outpatient HD unit on: next HD Wed 01/13/21

## 2021-01-12 ENCOUNTER — Ambulatory Visit (INDEPENDENT_AMBULATORY_CARE_PROVIDER_SITE_OTHER): Payer: Medicare Other | Admitting: Family

## 2021-01-12 ENCOUNTER — Encounter: Payer: Self-pay | Admitting: Family

## 2021-01-12 ENCOUNTER — Other Ambulatory Visit: Payer: Self-pay

## 2021-01-12 VITALS — BP 138/70 | HR 80 | Temp 97.1°F | Resp 16 | Ht 74.0 in | Wt 173.4 lb

## 2021-01-12 DIAGNOSIS — L84 Corns and callosities: Secondary | ICD-10-CM

## 2021-01-12 DIAGNOSIS — Z794 Long term (current) use of insulin: Secondary | ICD-10-CM | POA: Diagnosis not present

## 2021-01-12 DIAGNOSIS — M21611 Bunion of right foot: Secondary | ICD-10-CM

## 2021-01-12 DIAGNOSIS — I5042 Chronic combined systolic (congestive) and diastolic (congestive) heart failure: Secondary | ICD-10-CM

## 2021-01-12 DIAGNOSIS — M109 Gout, unspecified: Secondary | ICD-10-CM | POA: Diagnosis not present

## 2021-01-12 DIAGNOSIS — K219 Gastro-esophageal reflux disease without esophagitis: Secondary | ICD-10-CM | POA: Diagnosis not present

## 2021-01-12 DIAGNOSIS — M25551 Pain in right hip: Secondary | ICD-10-CM

## 2021-01-12 DIAGNOSIS — I10 Essential (primary) hypertension: Secondary | ICD-10-CM | POA: Diagnosis not present

## 2021-01-12 DIAGNOSIS — I25118 Atherosclerotic heart disease of native coronary artery with other forms of angina pectoris: Secondary | ICD-10-CM

## 2021-01-12 DIAGNOSIS — E1140 Type 2 diabetes mellitus with diabetic neuropathy, unspecified: Secondary | ICD-10-CM

## 2021-01-12 DIAGNOSIS — D638 Anemia in other chronic diseases classified elsewhere: Secondary | ICD-10-CM | POA: Diagnosis not present

## 2021-01-12 DIAGNOSIS — M21612 Bunion of left foot: Secondary | ICD-10-CM | POA: Diagnosis not present

## 2021-01-12 LAB — CBC WITH DIFFERENTIAL/PLATELET
Absolute Monocytes: 676 cells/uL (ref 200–950)
Basophils Absolute: 39 cells/uL (ref 0–200)
Basophils Relative: 0.8 %
Eosinophils Absolute: 221 cells/uL (ref 15–500)
Eosinophils Relative: 4.5 %
HCT: 28.9 % — ABNORMAL LOW (ref 38.5–50.0)
Hemoglobin: 9 g/dL — ABNORMAL LOW (ref 13.2–17.1)
Lymphs Abs: 1186 cells/uL (ref 850–3900)
MCH: 29.4 pg (ref 27.0–33.0)
MCHC: 31.1 g/dL — ABNORMAL LOW (ref 32.0–36.0)
MCV: 94.4 fL (ref 80.0–100.0)
MPV: 11.7 fL (ref 7.5–12.5)
Monocytes Relative: 13.8 %
Neutro Abs: 2778 cells/uL (ref 1500–7800)
Neutrophils Relative %: 56.7 %
Platelets: 150 10*3/uL (ref 140–400)
RBC: 3.06 10*6/uL — ABNORMAL LOW (ref 4.20–5.80)
RDW: 14 % (ref 11.0–15.0)
Total Lymphocyte: 24.2 %
WBC: 4.9 10*3/uL (ref 3.8–10.8)

## 2021-01-12 LAB — BASIC METABOLIC PANEL WITH GFR
BUN/Creatinine Ratio: 9 (calc) (ref 6–22)
BUN: 44 mg/dL — ABNORMAL HIGH (ref 7–25)
CO2: 30 mmol/L (ref 20–32)
Calcium: 9.5 mg/dL (ref 8.6–10.3)
Chloride: 94 mmol/L — ABNORMAL LOW (ref 98–110)
Creat: 5.13 mg/dL — ABNORMAL HIGH (ref 0.70–1.18)
GFR, Est African American: 12 mL/min/{1.73_m2} — ABNORMAL LOW (ref >=60)
GFR, Est Non African American: 10 mL/min/{1.73_m2} — ABNORMAL LOW (ref >=60)
Glucose, Bld: 125 mg/dL — ABNORMAL HIGH (ref 65–99)
Potassium: 4 mmol/L (ref 3.5–5.3)
Sodium: 136 mmol/L (ref 135–146)

## 2021-01-12 MED ORDER — PANTOPRAZOLE SODIUM 40 MG PO TBEC: DELAYED_RELEASE_TABLET | ORAL | 1 refills | Status: DC

## 2021-01-12 MED ORDER — FOLIC ACID 1 MG PO TABS: 1.0000 mg | ORAL_TABLET | Freq: Every day | ORAL | 1 refills | Status: DC

## 2021-01-12 MED ORDER — ALLOPURINOL 100 MG PO TABS: ORAL_TABLET | ORAL | 1 refills | Status: DC

## 2021-01-12 MED ORDER — DICLOFENAC SODIUM 1 % EX GEL: CUTANEOUS | 0 refills | Status: DC

## 2021-01-12 MED ORDER — ISOSORBIDE MONONITRATE ER 30 MG PO TB24: ORAL_TABLET | ORAL | 1 refills | Status: DC

## 2021-01-12 NOTE — Progress Notes (Signed)
Provider: Mahlani Berninger FNP-C   [name], NP  Patient Care Team: [name], NP as PCP - General (Geriatric Medicine) [name], MD as PCP - Infectious Diseases (Infectious Diseases) [name], MD as PCP - Cardiology (Cardiology) [name], MD as Consulting Physician (Ophthalmology) [name], MD as Consulting Physician (Cardiology) [name], MD as Consulting Physician (Infectious Diseases) Gardiner Barefoot, DPM as Consulting Physician (Podiatry) Naheem Mosco, Nelda Bucks, NP as Nurse Practitioner (Family Medicine) [place], [name]  Extended Emergency Contact Information Primary Emergency Contact: Alda Berthold [place] Phone: [phone] Relation: Sister Secondary Emergency ContactMORONI, NESTER Home Phone: [phone] Relation: Sister  Code Status:  Full Code  Goals of care: Advanced Directive information Advanced Directives 01/12/2021  Does Patient Have a Medical Advance Directive? Yes  Type of Paramedic of [place];Living will;Out of facility DNR (pink MOST or yellow form)  Does patient want to make changes to medical advance directive? No - Patient declined  Copy of [place] in Chart? Yes - validated most recent copy scanned in chart (See row information)  Would patient like information on creating a medical advance directive? -     Chief Complaint  Patient presents with  . Hospitalization Follow-up    Hospital Follow Up.    HPI:  Pt is a 70 y.o. male seen today for follow up Hospital visit and medical management of chronic diseases.He has a medical history of Type 2 diabetes Mellitus with Neuropathy,CAD on Brilinta,COPD,HIV managed by infectious disease,ESRD on HD on mon,wed and Fri,GERD with recurrent GI bleed secondary to AVM.He is status post hospital admission from 01/07/2021 - 01/09/2021 for GI bleed.He was also in the hospital 2 weeks prior to this admission  with GI bleed but left against medical advice but returned after he continued to have several bloody stool.EGD done showed 3/1 duodenal bulb nodule with steady non-pulsatile bleeding and was treated with APC and clipping x 4.Hos Hgb 7.1 was transfused with 5 units of PRBC.His hgb improved to 8.0 his Hgb on discharge was 7.6 EGD done and GI recommended overnight observation but left he refused and left AMA. follow up outpatient with GI.continued on Protonix 40 mg tablet twice daily.He denies any bloody stool today.  He states was seen by podiatrist Dr.Jennifer Adah Perl 01/11/21 was for callus and bunions on feet.He brought in a form to be filled for diabetic shoes.Has neuropathy on feet due to Type 2 DM .CBG this morning was 125 has been averaging in the 120's. Medication reviewed and discussed with patient.His hospital labs also reviewed and discussed with patient.    Past Medical History:  Diagnosis Date  . Acute respiratory failure ([place]) 03/01/2018  . Arthritis    "all over; mostly knees and back" (02/28/2018)  . Chronic lower back pain    stenosis  . Community acquired pneumonia 09/06/2013  . COPD (chronic obstructive pulmonary disease) ([place])   . Coronary atherosclerosis of native coronary artery    a. 02/2003 s/p CABG x 2 (VG->RI, VG->RPDA; b. 11/2019 PCI: LM nl, LAD 90d, D3 50, RI 100, LCX 100p, OM3 100 - fills via L->L collats from D2/dLAD, RCA 100p, VG->RPDA ok, VG->RI 95 (3.5x48 Synergy XD DES).  . Drug abuse ([place])    hx; tested for cocaine as recently as 2/08. says he is not using drugs now - avoided defib. for this reason   . ESRD (end stage renal disease) ([place])    Hemo M-W-F- Richarda Blade  .  Fall at home 10/2020  . GERD (gastroesophageal reflux disease)    takes OTC meds as needed  . GI bleeding    a. 11/2019 EGD: angiodysplastic lesions w/ bleeding s/p argon plasma/clipping/epi inj.  . Glaucoma    uses eye drops daily  . Hepatitis B 1968   "tx'd w/isolation; caught it from toilet stools  in gym"  . HFrEF (heart failure with reduced ejection fraction) ([place])    a. 01/2019 Echo: EF 40-45%, diffuse HK, mild basal septal hypertrophy.  . History of blood transfusion 03/01/2019  . History of colon polyps    benign  . History of gout    takes Allopurinol daily as well as Colchicine-if needed (02/28/2018)  . History of kidney stones   . HTN (hypertension)    takes Coreg,Imdur.and Apresoline daily  . Human immunodeficiency virus (HIV) disease ([place]) dx'd 71   on [place] as of 12/2020.    [name] Hyperlipidemia    takes Atorvastatin daily  . Ischemic cardiomyopathy    a. 01/2019 Echo: EF 40-45%, diffuse HK, mild basal septal hypertrophy. Diast dysfxn. Nl RV size/fxn. Sev dil LA. Triv MR/TR/PR.  [name] Muscle spasm    takes Zanaflex as needed  . Myocardial infarction ([place]) ~ 2004/2005  . Nocturia   . Peripheral neuropathy    takes gabapentin daily  . Pneumonia    "at least twice" (02/28/2018)  . Syphilis, unspecified   . Type II diabetes mellitus ([place]) 2004   Lantus daily.Average fasting blood sugar 125-199  . Wears glasses   . Wears partial dentures    Past Surgical History:  Procedure Laterality Date  . AV FISTULA PLACEMENT Left 08/02/2018   Procedure: ARTERIOVENOUS (AV) FISTULA CREATION  left arm radiocephlic;  Surgeon: Marty Heck, MD;  Location: [facility];  Service: Vascular;  Laterality: Left;  . AV FISTULA PLACEMENT Left 08/01/2019   Procedure: LEFT BRACHIOCEPHALIC ARTERIOVENOUS (AV) FISTULA CREATION;  Surgeon: [name], MD;  Location: [facility];  Service: Vascular;  Laterality: Left;  . BASCILIC VEIN TRANSPOSITION Left 10/03/2019   Procedure: BASILIC VEIN TRANSPOSITION LEFT SECOND STAGE;  Surgeon: [name], MD;  Location: [facility];  Service: Vascular;  Laterality: Left;  . CARDIAC CATHETERIZATION  10/2002; 12/19/2004   [name] 03/08/2011  . COLONOSCOPY  2013   Dr.John Henrene Pastor   . CORONARY ARTERY BYPASS GRAFT  02/24/2003   CABG X2/notes 03/08/2011  . CORONARY STENT  INTERVENTION N/A 12/19/2019   Procedure: CORONARY STENT INTERVENTION;  Surgeon: Jettie Booze, MD;  Location: [facility];  Service: Cardiovascular;  Laterality: N/A;  . ESOPHAGOGASTRODUODENOSCOPY (EGD) WITH PROPOFOL N/A 02/08/2019   Procedure: ESOPHAGOGASTRODUODENOSCOPY (EGD) WITH PROPOFOL;  Surgeon: Milus Banister, MD;  Location: [facility];  Service: Gastroenterology;  Laterality: N/A;  . ESOPHAGOGASTRODUODENOSCOPY (EGD) WITH PROPOFOL N/A 12/22/2019   Procedure: ESOPHAGOGASTRODUODENOSCOPY (EGD) WITH PROPOFOL;  Surgeon: Lavena Bullion, DO;  Location: [facility];  Service: Gastroenterology;  Laterality: N/A;  . ESOPHAGOGASTRODUODENOSCOPY (EGD) WITH PROPOFOL N/A 10/19/2020   Procedure: ESOPHAGOGASTRODUODENOSCOPY (EGD) WITH PROPOFOL;  Surgeon: Jackquline Denmark, MD;  Location: [facility]OSCOPY;  Service: Endoscopy;  Laterality: N/A;  . ESOPHAGOGASTRODUODENOSCOPY (EGD) WITH PROPOFOL N/A 12/22/2020   Procedure: ESOPHAGOGASTRODUODENOSCOPY (EGD) WITH PROPOFOL;  Surgeon: Gatha Mayer, MD;  Location: [facility];  Service: Endoscopy;  Laterality: N/A;  . ESOPHAGOGASTRODUODENOSCOPY (EGD) WITH PROPOFOL N/A 01/09/2021   Procedure: ESOPHAGOGASTRODUODENOSCOPY (EGD) WITH PROPOFOL;  Surgeon: Irene Shipper, MD;  Location: [facility] ENDOSCOPY;  Service: Endoscopy;  Laterality: N/A;  . HEMOSTASIS CLIP  PLACEMENT  12/22/2019   Procedure: HEMOSTASIS CLIP PLACEMENT;  Surgeon: Lavena Bullion, DO;  Location: [facility] ENDOSCOPY;  Service: Gastroenterology;;  . HEMOSTASIS CLIP PLACEMENT  12/22/2020   Procedure: HEMOSTASIS CLIP PLACEMENT;  Surgeon: Gatha Mayer, MD;  Location: [facility] ENDOSCOPY;  Service: Endoscopy;;  . HEMOSTASIS CONTROL  12/22/2020   Procedure: HEMOSTASIS CONTROL/hemospray;  Surgeon: Gatha Mayer, MD;  Location: [facility];  Service: Endoscopy;;  . HOT HEMOSTASIS N/A 02/08/2019   Procedure: HOT HEMOSTASIS (ARGON PLASMA COAGULATION/BICAP);  Surgeon: Milus Banister, MD;  Location: [facility] ENDOSCOPY;   Service: Gastroenterology;  Laterality: N/A;  . HOT HEMOSTASIS N/A 12/22/2019   Procedure: HOT HEMOSTASIS (ARGON PLASMA COAGULATION/BICAP);  Surgeon: Lavena Bullion, DO;  Location: [facility] ENDOSCOPY;  Service: Gastroenterology;  Laterality: N/A;  . HOT HEMOSTASIS N/A 10/19/2020   Procedure: HOT HEMOSTASIS (ARGON PLASMA COAGULATION/BICAP);  Surgeon: Jackquline Denmark, MD;  Location: [facility] ENDOSCOPY;  Service: Endoscopy;  Laterality: N/A;  . HOT HEMOSTASIS N/A 12/22/2020   Procedure: HOT HEMOSTASIS (ARGON PLASMA COAGULATION/BICAP);  Surgeon: Gatha Mayer, MD;  [facility] END[facility];  Service: Endoscopy;  Laterality: N/A;  . HOT HEMOSTASIS N/A 01/09/2021   Procedure: HOT HEMOSTASIS (ARGON PLASMA COAGULATION/BICAP);  Surgeon: Irene Shipper, MD;  Location: [facility] ENDOSCOPY;  Service: Endoscopy;  Laterality: N/A;  . INTERTROCHANTERIC HIP FRACTURE SURGERY Left 11/2006   [name] 03/08/2011  . INTRAVASCULAR ULTRASOUND/IVUS N/A 12/19/2019   Procedure: Intravascular Ultrasound/IVUS;  Surgeon: Jettie Booze, MD;  Location: [place] CV LAB;  Service: Cardiovascular;  Laterality: N/A;  . IR FLUORO GUIDE CV LINE RIGHT  07/24/2019  . IR FLUORO GUIDE CV LINE RIGHT  07/30/2019  . IR US GUIDE VASC ACCESS RIGHT  07/24/2019  . IR US GUIDE VASC ACCESS RIGHT  07/30/2019  . LAPAROSCOPIC CHOLECYSTECTOMY  05/2006  . LIGATION OF COMPETING BRANCHES OF ARTERIOVENOUS FISTULA Left 11/05/2018   Procedure: LIGATION OF COMPETING BRANCHES OF ARTERIOVENOUS FISTULA  LEFT  ARM;  Surgeon: Marty Heck, MD;  Location: [facility];  Service: Vascular;  Laterality: Left;  . LUMBAR LAMINECTOMY/DECOMPRESSION MICRODISCECTOMY N/A 02/29/2016   Procedure: Left L4-5 Lateral Recess Decompression, Removal Extradural Intraspinal Facet Cyst;  Surgeon: Marybelle Killings, MD;  Location: [facility];  Service: Orthopedics;  Laterality: N/A;  . MULTIPLE TOOTH EXTRACTIONS    . ORIF MANDIBULAR FRACTURE Left 08/13/2004   ORIF of left body fracture mandible with KLS Martin  2.3-mm six hole/notes 03/08/2011  . RIGHT/LEFT HEART CATH AND CORONARY/GRAFT ANGIOGRAPHY N/A 12/19/2019   Procedure: RIGHT/LEFT HEART CATH AND CORONARY/GRAFT ANGIOGRAPHY;  Surgeon: Jettie Booze, MD;  Location: [place] [facility] LAB;  Service: Cardiovascular;  Laterality: N/A;  . SCLEROTHERAPY  12/22/2019   Procedure: SCLEROTHERAPY;  Surgeon: Lavena Bullion, DO;  Location: [facility] ENDOSCOPY;  Service: Gastroenterology;;    Allergies  Allergen Reactions  . Augmentin [Amoxicillin-Pot Clavulanate] Diarrhea    Severe diarrhea  . Mucinex Fast-Max Other (See Comments)    Intensive sweating   . Amphetamines Other (See Comments)    Unknown reaction    Allergies as of 01/12/2021      Reactions   Augmentin [amoxicillin-pot Clavulanate] Diarrhea   Severe diarrhea   Mucinex Fast-max Other (See Comments)   Intensive sweating    Amphetamines Other (See Comments)   Unknown reaction      Medication List       Accurate as of January 12, 2021 11:06 AM. If you have any questions, ask your nurse or doctor.  STOP taking these medications   sevelamer carbonate 800 MG tablet Commonly known as: RENVELA Stopped by: [name], NP   traMADol 50 MG tablet Commonly known as: ULTRAM Stopped by: [name], NP     TAKE these medications   acetaminophen 650 MG CR tablet Commonly known as: TYLENOL Take 650-1,300 mg by mouth 3 (three) times daily.   allopurinol 100 MG tablet Commonly known as: ZYLOPRIM TAKE 1 TABLET(100 MG) BY MOUTH DAILY   Anoro Ellipta 62.5-25 MCG/INH Aepb Generic drug: umeclidinium-vilanterol INHALE 1 PUFF BY MOUTH EVERY DAY   B-D UF III MINI PEN NEEDLES 31G X 5 MM Misc Generic drug: Insulin Pen Needle USE FOUR TIMES DAILY   Biktarvy 50-200-25 MG Tabs tablet Generic drug: bictegravir-emtricitabine-tenofovir AF Take 1 tablet by mouth daily.   carvedilol 3.125 MG tablet Commonly known as: COREG Take 1 tablet (3.125 mg total) by mouth 2 (two) times  daily.   colchicine 0.6 MG tablet Take 0.6-1.2 mg by mouth daily as needed (gout flare-up).   diclofenac Sodium 1 % Gel Commonly known as: VOLTAREN APPLY 2 GRAMS TO THE AFFECTED AREA THREE TIMES DAILY AS NEEDED FOR PAIN   folic acid 1 MG tablet Commonly known as: FOLVITE Take 1 tablet (1 mg total) by mouth daily.   gabapentin 300 MG capsule Commonly known as: NEURONTIN Take 1 capsule (300 mg total) by mouth daily. Dose change due to renal function   insulin lispro 100 UNIT/ML KwikPen Commonly known as: HumaLOG KwikPen Inject 0.11m (20 Units total) into the skin 3 (three) times daily as needed for high blood sugar (above 150)   iron sucrose in sodium chloride 0.9 % 100 mL Iron Sucrose (Venofer)   isosorbide mononitrate 30 MG 24 hr tablet Commonly known as: IMDUR TAKE 1 TABLET BY MOUTH EVERY DAY   lanthanum 1000 MG chewable tablet Commonly known as: FOSRENOL Chew 1 tablet by mouth in the morning, at noon, and at bedtime. With meals.   latanoprost 0.005 % ophthalmic solution Commonly known as: XALATAN Place 1 drop into both eyes at bedtime.   MIRCERA IJ Mircera   multivitamin Tabs tablet Take 1 tablet by mouth daily.   nitroGLYCERIN 0.3 MG SL tablet Commonly known as: NITROSTAT ONE TABLET UNDER TONGUE AS NEEDED FOR CHEST PAIN   OneTouch Delica Plus [id number] Inject 1 Device as directed 3 (three) times daily. Dx: E11.40   OneTouch Ultra test strip Generic drug: glucose blood CHECK BLOOD SUGAR 3 TIMES  DAILY   pantoprazole 40 MG tablet Commonly known as: PROTONIX TAKE 1 TABLET(40 MG) BY MOUTH TWICE DAILY   polyethylene glycol 17 g packet Commonly known as: MIRALAX / GLYCOLAX Take 17 g by mouth daily as needed for mild constipation.   SENSIPAR PO Take 1 tablet by mouth 3 (three) times a week. At dialysis   timolol 0.5 % ophthalmic solution Commonly known as: TIMOPTIC Place 1 drop into both eyes daily.       Review of Systems  Constitutional:  Negative for appetite change, chills, fatigue and fever.  HENT: Negative for congestion, rhinorrhea, sinus pressure, sinus pain, sneezing and sore throat.   Eyes: Negative for discharge, redness and itching.  Respiratory: Negative for cough, chest tightness, shortness of breath and wheezing.   Cardiovascular: Negative for chest pain, palpitations and leg swelling.  Gastrointestinal: Negative for abdominal distention, abdominal pain, constipation, diarrhea, nausea and vomiting.  Endocrine: Negative for cold intolerance, heat intolerance, polydipsia, polyphagia and polyuria.  Genitourinary:  On HD on Mon-Wed-Fri   Musculoskeletal: Positive for arthralgias and joint swelling. Negative for gait problem and myalgias.       Right hip pain  Bunion,callus on feet   Skin: Negative for color change, pallor and rash.  Neurological: Positive for numbness. Negative for dizziness, speech difficulty, weakness, light-headedness and headaches.       Numbness and tingling on the feet   Hematological: Does not bruise/bleed easily.  Psychiatric/Behavioral: Negative for agitation, behavioral problems, confusion and sleep disturbance. The patient is not nervous/anxious.     Immunization History  Administered Date(s) Administered  . Fluad Quad(high Dose 65+) 06/14/2019  . Hepatitis B 08/28/2006, 10/02/2007, 04/01/2008  . Hepatitis B, adult 06/03/2014, 07/04/2014  . Influenza Split 07/28/2011  . Influenza Whole 08/28/2006, 09/10/2007, 09/15/2008, 08/03/2009, 07/26/2010  . Influenza, High Dose Seasonal PF 07/04/2018  . Influenza,inj,Quad PF,6+ Mos 07/04/2014, 07/06/2015, 07/12/2016, 07/11/2017  . Influenza-Unspecified 06/24/2013, 06/30/2020  . PFIZER(Purple Top)SARS-COV-2 Vaccination 11/16/2019, 12/07/2019, 07/23/2020  . Pneumococcal Conjugate-13 08/18/2016  . Pneumococcal Polysaccharide-23 08/28/2006, 07/28/2011, 05/30/2018  . Tdap 06/14/2019  . Zoster 06/03/2014  . Zoster Recombinat (Shingrix)  07/24/2017, 01/09/2018   Pertinent  Health Maintenance Due  Topic Date Due  . OPHTHALMOLOGY EXAM  10/07/2020  . FOOT EXAM  12/30/2020  . HEMOGLOBIN A1C  03/14/2021  . LIPID PANEL  12/15/2021  . COLONOSCOPY (Pts 45-45yr Insurance coverage will need to be confirmed)  10/01/2022  . INFLUENZA VACCINE  Completed  . PNA vac Low Risk Adult  Completed   Fall Risk  01/12/2021 12/15/2020 12/14/2020 11/03/2020 09/08/2020  Falls in the past year? 0 0 _0 Comment - - - - Slip and fell off chair 1 month ago.  Number falls in past yr: 0 0 1 1 0  Comment - - - Fall 11/02/20 -  Injury with Fall? 0 0 0 0 1  Comment - - - - -  Risk Factor Category  - - - - -  Risk for fall due to : - - History of fall(s) History of fall(s);Impaired balance/gait;Impaired mobility;Impaired vision;Medication side effect;Orthopedic patient -  Risk for fall due to: Comment - - - - -  Follow up - - - Falls prevention discussed;Education provided;Falls evaluation completed -   Functional Status Survey:    Vitals:   01/12/21 1032  BP: 138/70  Pulse: 80  Resp: 16  Temp: (!) 97.1 F (36.2 C)  SpO2: 98%  Weight: 173 lb 6.4 oz (78.7 kg)  Height: _1  (1.88 m)   Body mass index is 22.26 kg/m. Physical Exam Vitals reviewed.  Constitutional:      General: He is not in acute distress.    Appearance: He is normal weight. He is not ill-appearing.  HENT:     Head: Normocephalic.     Right Ear: Tympanic membrane, ear canal and external ear normal. There is no impacted cerumen.     Left Ear: Tympanic membrane, ear canal and external ear normal. There is no impacted cerumen.     Nose: Nose normal. No congestion or rhinorrhea.     Mouth/Throat:     Mouth: Mucous membranes are moist.     Pharynx: Oropharynx is clear. No oropharyngeal exudate or posterior oropharyngeal erythema.  Eyes:     General: No scleral icterus.       Right eye: No discharge.        Left eye: No discharge.     Extraocular Movements: Extraocular  movements intact.  Conjunctiva/sclera: Conjunctivae normal.     Pupils: Pupils are equal, round, and reactive to light.  Neck:     Vascular: No carotid bruit.  Cardiovascular:     Rate and Rhythm: Normal rate and regular rhythm.     Pulses: Normal pulses.     Heart sounds: Normal heart sounds. No murmur heard. No friction rub. No gallop.   Pulmonary:     Effort: Pulmonary effort is normal. No respiratory distress.     Breath sounds: Normal breath sounds. No wheezing, rhonchi or rales.  Chest:     Chest wall: No tenderness.  Abdominal:     General: Bowel sounds are normal. There is no distension.     Palpations: Abdomen is soft. There is no mass.     Tenderness: There is no abdominal tenderness. There is no right CVA tenderness, left CVA tenderness, guarding or rebound.  Musculoskeletal:        General: No swelling or tenderness.     Cervical back: Normal range of motion. No rigidity or tenderness.     Right lower leg: No edema.     Left lower leg: No edema.     Right foot: Normal range of motion. Bunion present.     Left foot: Normal range of motion. Bunion present.       Feet:     Comments: Unsteady gait ambulates with a walker   Feet:     Right foot:     Skin integrity: Callus present. No ulcer, skin breakdown, erythema, warmth or fissure.     Left foot:     Skin integrity: No ulcer, skin breakdown, erythema, warmth or fissure.  Lymphadenopathy:     Cervical: No cervical adenopathy.  Skin:    General: Skin is warm and dry.     Coloration: Skin is not pale.     Findings: No bruising, erythema or rash.  Neurological:     Mental Status: He is alert and oriented to person, place, and time.     Cranial Nerves: No cranial nerve deficit.     Motor: No weakness.     Gait: Gait abnormal.  Psychiatric:        Mood and Affect: Mood normal.        Behavior: Behavior normal.        Thought Content: Thought content normal.        Judgment: Judgment normal.     Labs  reviewed: Recent Labs    11/25/20 0637 12/11/20 1239 12/23/20 0125 12/24/20 0240 01/07/21 1044 01/08/21 1106 01/08/21 1335 01/09/21 0257  NA 142   < > 143   < > 136  --  137 136  K 4.1   < > 3.6   < > 4.3  --  4.1 3.6  CL 100   < > 106   < > 95*  --  100 99  CO2 28   < > 23   < > 30  --  26 30  GLUCOSE 127*   < > 110*   < > 128*  --  151* 121*  BUN 105*   < > 107*   < > 64*  --  73* 31*  CREATININE 6.84*   < > 5.75*   < > 5.19*  --  6.28* 3.75*  CALCIUM 7.7*   < > 8.4*   < > 9.3  --  8.7* 8.2*  MG 2.1  --  1.9  --   --  2.2  --   --  PHOS 5.3*  --   --   --   --   --  4.7* 4.9*   < > = values in this interval not displayed.   Recent Labs    12/11/20 1239 12/15/20 1105 12/21/20 0834 01/07/21 1044 01/08/21 1335  AST _0 --   ALT 14 7* 9 13  --   ALKPHOS 113  --  79 86  --   BILITOT 0.7 0.3 0.8 0.6  --   PROT 8.1 7.1 6.8 6.5  --   ALBUMIN 3.4*  --  2.8* 3.1* 2.7*   Recent Labs    12/11/20 1239 12/11/20 1317 12/15/20 1105 12/21/20 0834 12/21/20 2052 01/07/21 1044 01/08/21 1300 01/09/21 0257  WBC 5.6  --  6.0 7.5   < > 5.7 4.5 4.6  NEUTROABS 3.9  --  3,750 5.8  --   --   --   --   HGB 7.5*   < > 8.2* 6.2*   < > 8.0* 6.9* 7.6*  HCT 25.4*   < > 26.0* 20.6*   < > 26.2* 21.8* 23.2*  MCV 103.3*  --  98.9 103.0*   < > 99.2 97.3 93.5  PLT 208  --  192 168   < > 146* 145* 122*   < > = values in this interval not displayed.   Lab Results  Component Value Date   TSH 0.49 12/15/2020   Lab Results  Component Value Date   HGBA1C 4.6 12/15/2020   Lab Results  Component Value Date   CHOL 143 12/15/2020   HDL 31 (L) 12/15/2020   LDLCALC 79 12/15/2020   TRIG 248 (H) 12/15/2020   CHOLHDL 4.6 12/15/2020    Significant Diagnostic Results in last 30 days:  DG Abdomen Acute W/Chest  Result Date: 12/21/2020 CLINICAL DATA:  Abdominal pain, vomiting EXAM: DG ABDOMEN ACUTE WITH 1 VIEW CHEST COMPARISON:  07/23/2019 FINDINGS: Prior CABG. Cardiomegaly. Aortic  atherosclerosis. Areas of scarring in the upper lobes bilaterally. No acute confluent opacities or effusions. Prior cholecystectomy. Nonobstructive bowel gas pattern. No free air or organomegaly. Vascular calcifications noted. No visible aneurysm. IMPRESSION: No bowel obstruction or free air. Cardiomegaly.  No acute cardiopulmonary disease. Electronically Signed   By: Rolm Baptise M.D.   On: 12/21/2020 09:41    Assessment/Plan 1. Gout, unspecified cause, unspecified chronicity, unspecified site Reports no recent gout flare  - continue on allopurinol  - allopurinol (ZYLOPRIM) 100 MG tablet; TAKE 1 TABLET(100 MG) BY MOUTH DAILY  Dispense: 90 tablet; Refill: 1  2. Chronic combined systolic and diastolic congestive heart failure (HCC) No signs of fluid overload since on HD three times per week.  Continue imdur and coreg  - isosorbide mononitrate (IMDUR) 30 MG 24 hr tablet; TAKE 1 TABLET BY MOUTH EVERY DAY  Dispense: 90 tablet; Refill: 1  3. Essential hypertension B/p well controlled  - continue on Imdur and coreg  - BMP with eGFR([facility])  4. Type 2 diabetes mellitus with diabetic neuropathy, with long-term current use of insulin (HCC) Lab Results  Component Value Date   HGBA1C 4.6 12/15/2020   CBG well controlled.No hypoglycemia reported. Continue on Humalog Kwik pen as needed for CBG > 150  - continue on Gabapentin for neuropathy  - up to date on foot exam. Continue to follow up with Podiatrist form completed for diabetic shoes to be faxed by [facility]  - has upcoming appointment with Ophthalmology.   5. Coronary artery disease of native artery  of native heart with stable angina pectoris (HCC) Stable.continue to controlled high risk factors.  - continue on Nitro as needed   6. Gastroesophageal reflux disease without esophagitis Status post hospital admission as above for GI bleed. Continue on Protonix  -will recheck CBC today. - pantoprazole (PROTONIX) 40 MG tablet; TAKE 1 TABLET(40 MG)  BY MOUTH TWICE DAILY  Dispense: 180 tablet; Refill: 1  7. Anemia of chronic disease Hgb 7.6 on discharge. Will recheck CBC today - CBC with Differential/Platelet  8. Bunion of left foot Left great toe no ulceration noted. - continue to follow up with podiatrist  Diabetic shoes filled out today and signed by MD   9. Bunion, right foot Right great toe no ulceration noted. - continue to follow up with podiatrist  Diabetic shoes filled out today and signed by MD   10. Callus of foot Tender to palpation no erythema or drainage noted.  Continue to follow up with podiatrist  High risk for ulceration due to Type 2 DM. - Diabetic foot shoes form filled out and given to MD to sign then be faxed by [facility] Rodena Piety May.   11.right hip pain  Continue on extra strength Tylenol as needed for pain along with lidocaine patch daily  - States he will contact Orthopedic specialist fr evaluation declined referral states already established.   Family/ staff Communication: Reviewed plan of care with patient verbalized understanding.  Labs/tests ordered:   - CBC with Differential/Platelet - BMP with eGFR([facility])  Next Appointment : Has appointment in place already.  [name], NP

## 2021-01-12 NOTE — Patient Instructions (Signed)
-   labs done today will call you with results

## 2021-01-13 ENCOUNTER — Telehealth: Payer: Self-pay | Admitting: *Deleted

## 2021-01-13 DIAGNOSIS — N186 End stage renal disease: Secondary | ICD-10-CM | POA: Diagnosis not present

## 2021-01-13 DIAGNOSIS — N2581 Secondary hyperparathyroidism of renal origin: Secondary | ICD-10-CM | POA: Diagnosis not present

## 2021-01-13 DIAGNOSIS — Z992 Dependence on renal dialysis: Secondary | ICD-10-CM | POA: Diagnosis not present

## 2021-01-13 NOTE — Telephone Encounter (Signed)
noted 

## 2021-01-13 NOTE — Telephone Encounter (Signed)
Received from from [place] to fax to [place] [phone] for [place].  Form filled out and faxed to Fax: [phone]  They faxed form back requesting OV notes that shows foot exam to go along with the CMN Form.   Will fax once note is completed.

## 2021-01-14 ENCOUNTER — Other Ambulatory Visit: Payer: Self-pay

## 2021-01-14 ENCOUNTER — Telehealth (INDEPENDENT_AMBULATORY_CARE_PROVIDER_SITE_OTHER): Payer: Medicare Other | Admitting: Nurse Practitioner

## 2021-01-14 DIAGNOSIS — M5441 Lumbago with sciatica, right side: Secondary | ICD-10-CM | POA: Diagnosis not present

## 2021-01-14 NOTE — Telephone Encounter (Signed)
Form faxed back to Fax: [phone] with OV notes attached.

## 2021-01-14 NOTE — Progress Notes (Signed)
This service is provided via telemedicine  No vital signs collected/recorded due to the encounter was a telemedicine visit.   Location of patient (ex: home, work):  Home  Patient consents to a telephone visit:  Yes, see encounter dated 07/07/2020  Location of the provider (ex: office, home):  [facility]  Name of any referring provider:  N/A  Names of all persons participating in the telemedicine service and their role in the encounter:  Jessica Eubanks, Nurse Practitioner, Chales Pelissier, CMA, and patient.   Time spent on call:  8 minutes with medical assistant  

## 2021-01-14 NOTE — Progress Notes (Signed)
Careteam: Patient Care Team: [name], NP as PCP - General (Geriatric Medicine) [name], MD as PCP - Infectious Diseases (Infectious Diseases) [name], MD as PCP - Cardiology (Cardiology) [name], MD as Consulting Physician (Ophthalmology) [name], MD as Consulting Physician (Cardiology) [name], MD as Consulting Physician (Infectious Diseases) Gardiner Barefoot, DPM as Consulting Physician (Podiatry) [name], Nelda Bucks, NP as Nurse Practitioner (Family Medicine) [place], [name]  Advanced Directive information    Allergies  Allergen Reactions  . Augmentin [Amoxicillin-Pot Clavulanate] Diarrhea    Severe diarrhea  . Mucinex Fast-Max Other (See Comments)    Intensive sweating   . Amphetamines Other (See Comments)    Unknown reaction    Chief Complaint  Patient presents with  . Acute Visit    Patient would like to discuss medication and treatment plan. Patient would like to know if he could get pain medication for pain. Pain in right hip down to foot. It gets worse after dialysis.     HPI: Patient is a 70 y.o. male to discuss pain.  Reports he is having pain in right hip to right foot.  Reports he can not sleep at night due to pain.  Wants refill on tramadol. Dr Lorin Mercy wanted to see him prior to refill.  He has to be in a heated chair when at dialysis.  He has order for MR lumbar spine.   Review of Systems:  Review of Systems  Constitutional: Negative for chills and fever.  Musculoskeletal: Positive for back pain, joint pain and myalgias.  Neurological: Positive for tingling.    Past Medical History:  Diagnosis Date  . Acute respiratory failure ([place]) 03/01/2018  . Arthritis    "all over; mostly knees and back" (02/28/2018)  . Chronic lower back pain    stenosis  . Community acquired pneumonia 09/06/2013  . COPD (chronic obstructive pulmonary disease) ([place])   . Coronary atherosclerosis of native coronary artery    a.  02/2003 s/p CABG x 2 (VG->RI, VG->RPDA; b. 11/2019 PCI: LM nl, LAD 90d, D3 50, RI 100, LCX 100p, OM3 100 - fills via L->L collats from D2/dLAD, RCA 100p, VG->RPDA ok, VG->RI 95 (3.5x48 Synergy XD DES).  . Drug abuse ([place])    hx; tested for cocaine as recently as 2/08. says he is not using drugs now - avoided defib. for this reason   . ESRD (end stage renal disease) ([place])    Hemo M-W-F- Richarda Blade  . Fall at home 10/2020  . GERD (gastroesophageal reflux disease)    takes OTC meds as needed  . GI bleeding    a. 11/2019 EGD: angiodysplastic lesions w/ bleeding s/p argon plasma/clipping/epi inj.  . Glaucoma    uses eye drops daily  . Hepatitis B 1968   "tx'd w/isolation; caught it from toilet stools in gym"  . HFrEF (heart failure with reduced ejection fraction) ([place])    a. 01/2019 Echo: EF 40-45%, diffuse HK, mild basal septal hypertrophy.  . History of blood transfusion 03/01/2019  . History of colon polyps    benign  . History of gout    takes Allopurinol daily as well as Colchicine-if needed (02/28/2018)  . History of kidney stones   . HTN (hypertension)    takes Coreg,Imdur.and Apresoline daily  . Human immunodeficiency virus (HIV) disease ([place]) dx'd 72   on [place] as of 12/2020.    [name] Hyperlipidemia    takes Atorvastatin daily  . Ischemic cardiomyopathy  a. 01/2019 Echo: EF 40-45%, diffuse HK, mild basal septal hypertrophy. Diast dysfxn. Nl RV size/fxn. Sev dil LA. Triv MR/TR/PR.  [name] Muscle spasm    takes Zanaflex as needed  . Myocardial infarction ([place]) ~ 2004/2005  . Nocturia   . Peripheral neuropathy    takes gabapentin daily  . Pneumonia    "at least twice" (02/28/2018)  . Syphilis, unspecified   . Type II diabetes mellitus ([place]) 2004   Lantus daily.Average fasting blood sugar 125-199  . Wears glasses   . Wears partial dentures    Past Surgical History:  Procedure Laterality Date  . AV FISTULA PLACEMENT Left 08/02/2018   Procedure: ARTERIOVENOUS (AV) FISTULA CREATION   left arm radiocephlic;  Surgeon: Marty Heck, MD;  Location: [place];  Service: Vascular;  Laterality: Left;  . AV FISTULA PLACEMENT Left 08/01/2019   Procedure: LEFT BRACHIOCEPHALIC ARTERIOVENOUS (AV) FISTULA CREATION;  Surgeon: [name], MD;  Location: [place];  Service: Vascular;  Laterality: Left;  . BASCILIC VEIN TRANSPOSITION Left 10/03/2019   Procedure: BASILIC VEIN TRANSPOSITION LEFT SECOND STAGE;  Surgeon: [name], MD;  Location: [place];  Service: Vascular;  Laterality: Left;  . CARDIAC CATHETERIZATION  10/2002; 12/19/2004   [name] 03/08/2011  . COLONOSCOPY  2013   Dr.John Henrene Pastor   . CORONARY ARTERY BYPASS GRAFT  02/24/2003   CABG X2/notes 03/08/2011  . CORONARY STENT INTERVENTION N/A 12/19/2019   Procedure: CORONARY STENT INTERVENTION;  Surgeon: Jettie Booze, MD;  Location: [place] CV LAB;  Service: Cardiovascular;  Laterality: N/A;  . ESOPHAGOGASTRODUODENOSCOPY (EGD) WITH PROPOFOL N/A 02/08/2019   Procedure: ESOPHAGOGASTRODUODENOSCOPY (EGD) WITH PROPOFOL;  Surgeon: Milus Banister, MD;  Location: [place];  Service: Gastroenterology;  Laterality: N/A;  . ESOPHAGOGASTRODUODENOSCOPY (EGD) WITH PROPOFOL N/A 12/22/2019   Procedure: ESOPHAGOGASTRODUODENOSCOPY (EGD) WITH PROPOFOL;  Surgeon: Lavena Bullion, DO;  Location: [place];  Service: Gastroenterology;  Laterality: N/A;  . ESOPHAGOGASTRODUODENOSCOPY (EGD) WITH PROPOFOL N/A 10/19/2020   Procedure: ESOPHAGOGASTRODUODENOSCOPY (EGD) WITH PROPOFOL;  Surgeon: Jackquline Denmark, MD;  Location[facility] ENDOSCOPY;  Service: Endoscopy;  Laterality: N/A;  . ESOPHAGOGASTRODUODENOSCOPY (EGD) WITH PROPOFOL N/A 12/22/2020   Procedure: ESOPHAGOGASTRODUODENOSCOPY (EGD) WITH PROPOFOL;  Surgeon: Gatha Mayer, MD;  Location: [place];  Service: Endoscopy;  Laterality: N/A;  . ESOPHAGOGASTRODUODENOSCOPY (EGD) WITH PROPOFOL N/A 01/09/2021   Procedure: ESOPHAGOGASTRODUODENOSCOPY (EGD) WITH PROPOFOL;  Surgeon: Irene Shipper, MD;   Location: [facility] ENDOSCOPY;  Service: Endoscopy;  Laterality: N/A;  . HEMOSTASIS CLIP PLACEMENT  12/22/2019   Procedure: HEMOSTASIS CLIP PLACEMENT;  Surgeon: Lavena Bullion, DO;  Location: [place] ENDOSCOPY;  Service: Gastroenterology;;  . HEMOSTASIS CLIP PLACEMENT  12/22/2020   Procedure: HEMOSTASIS CLIP PLACEMENT;  Surgeon: Gatha Mayer, MD;  Location: [facility] ENDOSCOPY;  Service: Endoscopy;;  . HEMOSTASIS CONTROL  12/22/2020   Procedure: HEMOSTASIS CONTROL/hemospray;  Surgeon: Gatha Mayer, MD;  Location: [place];  Service: Endoscopy;;  . HOT HEMOSTASIS N/A 02/08/2019   Procedure: HOT HEMOSTASIS (ARGON PLASMA COAGULATION/BICAP);  Surgeon: Milus Banister, MD;  Location: [facility] ENDOSCOPY;  Service: Gastroenterology;  Laterality: N/A;  . HOT HEMOSTASIS N/A 12/22/2019   Procedure: HOT HEMOSTASIS (ARGON PLASMA COAGULATION/BICAP);  Surgeon: Lavena Bullion, DO;  Location: [facility] ENDOSCOPY;  Service: Gastroenterology;  Laterality: N/A;  . HOT HEMOSTASIS N/A 10/19/2020   Procedure: HOT HEMOSTASIS (ARGON PLASMA COAGULATION/BICAP);  Surgeon: Jackquline Denmark, MD;  Location: [facility]COPY;  Service: Endoscopy;  Laterality: N/A;  . HOT HEMOSTASIS N/A 12/22/2020   Procedure: HOT HEMOSTASIS (ARGON  PLASMA COAGULATION/BICAP);  Surgeon: Gatha Mayer, MD;  Location: [facility] ENDOSCOPY;  Service: Endoscopy;  Laterality: N/A;  . HOT HEMOSTASIS N/A 01/09/2021   Procedure: HOT HEMOSTASIS (ARGON PLASMA COAGULATION/BICAP);  Surgeon: Irene Shipper, MD;  Location: [facility] END[facility];  Service: Endoscopy;  Laterality: N/A;  . INTERTROCHANTERIC HIP FRACTURE SURGERY Left 11/2006   [name] 03/08/2011  . INTRAVASCULAR ULTRASOUND/IVUS N/A 12/19/2019   Procedure: Intravascular Ultrasound/IVUS;  Surgeon: Jettie Booze, MD;  Location: [place] CV LAB;  Service: Cardiovascular;  Laterality: N/A;  . IR FLUORO GUIDE CV LINE RIGHT  07/24/2019  . IR FLUORO GUIDE CV LINE RIGHT  07/30/2019  . IR US GUIDE VASC ACCESS RIGHT  07/24/2019  . IR US GUIDE  VASC ACCESS RIGHT  07/30/2019  . LAPAROSCOPIC CHOLECYSTECTOMY  05/2006  . LIGATION OF COMPETING BRANCHES OF ARTERIOVENOUS FISTULA Left 11/05/2018   Procedure: LIGATION OF COMPETING BRANCHES OF ARTERIOVENOUS FISTULA  LEFT  ARM;  Surgeon: Marty Heck, MD;  Location: [place];  Service: Vascular;  Laterality: Left;  . LUMBAR LAMINECTOMY/DECOMPRESSION MICRODISCECTOMY N/A 02/29/2016   Procedure: Left L4-5 Lateral Recess Decompression, Removal Extradural Intraspinal Facet Cyst;  Surgeon: Marybelle Killings, MD;  Location: [place];  Service: Orthopedics;  Laterality: N/A;  . MULTIPLE TOOTH EXTRACTIONS    . ORIF MANDIBULAR FRACTURE Left 08/13/2004   ORIF of left body fracture mandible with KLS Martin 2.3-mm six hole/notes 03/08/2011  . RIGHT/LEFT HEART CATH AND CORONARY/GRAFT ANGIOGRAPHY N/A 12/19/2019   Procedure: RIGHT/LEFT HEART CATH AND CORONARY/GRAFT ANGIOGRAPHY;  Surgeon: Jettie Booze, MD;  Location: [place] CV LAB;  Service: Cardiovascular;  Laterality: N/A;  . SCLEROTHERAPY  12/22/2019   Procedure: SCLEROTHERAPY;  Surgeon: Lavena Bullion, DO;  Location: [facility] ENDOSCOPY;  Service: Gastroenterology;;   Social History:   reports that he has been smoking cigarettes. He has a 21.50 pack-year smoking history. He has never used smokeless tobacco. He reports previous alcohol use of about 12.0 standard drinks of alcohol per week. He reports previous drug use. Drug: Cocaine.  Family History  Problem Relation Age of Onset  . Heart failure Father   . Hypertension Father   . Diabetes Brother   . Heart attack Brother   . Alzheimer's disease Mother   . Stroke Sister   . Diabetes Sister   . Alzheimer's disease Sister   . Hypertension Brother   . Diabetes Brother   . Drug abuse Brother   . Colon cancer Neg Hx     Medications: Patient's Medications  New Prescriptions   No medications on file  Previous Medications   ACETAMINOPHEN (TYLENOL) 650 MG CR TABLET    Take 650-1,300 mg by mouth 3  (three) times daily.   ALLOPURINOL (ZYLOPRIM) 100 MG TABLET    TAKE 1 TABLET(100 MG) BY MOUTH DAILY   ANORO ELLIPTA 62.5-25 MCG/INH AEPB    INHALE 1 PUFF BY MOUTH EVERY DAY   B-D UF III MINI PEN NEEDLES 31G X 5 MM MISC    USE FOUR TIMES DAILY   BICTEGRAVIR-EMTRICITABINE-TENOFOVIR AF (BIKTARVY) 50-200-25 MG TABS TABLET    Take 1 tablet by mouth daily.   CARVEDILOL (COREG) 3.125 MG TABLET    Take 1 tablet (3.125 mg total) by mouth 2 (two) times daily.   CINACALCET HCL (SENSIPAR PO)    Take 1 tablet by mouth 3 (three) times a week. At dialysis   COLCHICINE 0.6 MG TABLET    Take 0.6-1.2 mg by mouth daily as needed (gout flare-up).   DICLOFENAC  SODIUM (VOLTAREN) 1 % GEL    APPLY 2 GRAMS TO THE AFFECTED AREA THREE TIMES DAILY AS NEEDED FOR PAIN   FOLIC ACID (FOLVITE) 1 MG TABLET    Take 1 tablet (1 mg total) by mouth daily.   GABAPENTIN (NEURONTIN) 300 MG CAPSULE    Take 1 capsule (300 mg total) by mouth daily. Dose change due to renal function   INSULIN LISPRO (HUMALOG KWIKPEN) 100 UNIT/ML KWIKPEN    Inject 0.40ml (20 Units total) into the skin 3 (three) times daily as needed for high blood sugar (above 150)   IRON SUCROSE IN SODIUM CHLORIDE 0.9 % 100 ML    Iron Sucrose (Venofer)   ISOSORBIDE MONONITRATE (IMDUR) 30 MG 24 HR TABLET    TAKE 1 TABLET BY MOUTH EVERY DAY   LANCETS (ONETOUCH DELICA PLUS [id number]) MISC    Inject 1 Device as directed 3 (three) times daily. Dx: E11.40   LANTHANUM (FOSRENOL) 1000 MG CHEWABLE TABLET    Chew 1 tablet by mouth in the morning, at noon, and at bedtime. With meals.   LATANOPROST (XALATAN) 0.005 % OPHTHALMIC SOLUTION    Place 1 drop into both eyes at bedtime.   METHOXY PEG-EPOETIN BETA (MIRCERA IJ)    Mircera   MULTIVITAMIN (RENA-VIT) TABS TABLET    Take 1 tablet by mouth daily.   NITROGLYCERIN (NITROSTAT) 0.3 MG SL TABLET    ONE TABLET UNDER TONGUE AS NEEDED FOR CHEST PAIN   ONETOUCH ULTRA TEST STRIP    CHECK BLOOD SUGAR 3 TIMES  DAILY   PANTOPRAZOLE (PROTONIX) 40  MG TABLET    TAKE 1 TABLET(40 MG) BY MOUTH TWICE DAILY   POLYETHYLENE GLYCOL (MIRALAX / GLYCOLAX) 17 G PACKET    Take 17 g by mouth daily as needed for mild constipation.   TIMOLOL (TIMOPTIC) 0.5 % OPHTHALMIC SOLUTION    Place 1 drop into both eyes daily.   TRAMADOL (ULTRAM) 50 MG TABLET    Take by mouth every 12 (twelve) hours as needed.  Modified Medications   No medications on file  Discontinued Medications   No medications on file    Physical Exam:  There were no vitals filed for this visit. There is no height or weight on file to calculate BMI. Wt Readings from Last 3 Encounters:  01/12/21 173 lb 6.4 oz (78.7 kg)  01/09/21 168 lb 10.4 oz (76.5 kg)  12/24/20 177 lb 4 oz (80.4 kg)      Labs reviewed: Basic Metabolic Panel: Recent Labs    02/05/20 0000 03/11/20 1730 10/17/20 0139 10/17/20 1401 11/25/20 0637 12/11/20 1239 12/15/20 1105 12/21/20 0834 12/23/20 0125 12/24/20 0240 01/08/21 1106 01/08/21 1335 01/09/21 0257 01/12/21 1139  NA 141   < >  --    < > 142   < > 138   < > 143   < >  --  137 136 136  K 4.1   < >  --    < > 4.1   < > 4.0   < > 3.6   < >  --  4.1 3.6 4.0  CL 103   < >  --    < > 100   < > 95*   < > 106   < >  --  100 99 94*  CO2  --    < >  --    < > 28   < > 29   < > 23   < >  --  26 30 30   GLUCOSE  --    < >  --    < > 127*   < > 112*   < > 110*   < >  --  151* 121* 125*  BUN 65*   < >  --    < > 105*   < > 51*   < > 107*   < >  --  73* 31* 44*  CREATININE 5.6*   < > 8.61*   < > 6.84*   < > 5.77*   < > 5.75*   < >  --  6.28* 3.75* 5.13*  CALCIUM 9.1   < >  --    < > 7.7*   < > 8.7   < > 8.4*   < >  --  8.7* 8.2* 9.5  MG  --   --   --    < > 2.1  --   --   --  1.9  --  2.2  --   --   --   PHOS  --   --   --    < > 5.3*  --   --   --   --   --   --  4.7* 4.9*  --   TSH 0.18*  --  0.748  --   --   --  0.49  --   --   --   --   --   --   --    < > = values in this interval not displayed.   Liver Function Tests: Recent Labs    12/11/20 1239  12/15/20 1105 12/21/20 0834 01/07/21 1044 01/08/21 1335  AST 20 12 16 21   --   ALT 14 7* 9 13  --   ALKPHOS 113  --  79 86  --   BILITOT 0.7 0.3 0.8 0.6  --   PROT 8.1 7.1 6.8 6.5  --   ALBUMIN 3.4*  --  2.8* 3.1* 2.7*   Recent Labs    10/16/20 1807 12/21/20 0834  LIPASE 70* 73*   No results for input(s): AMMONIA in the last 8760 hours. CBC: Recent Labs    12/15/20 1105 12/21/20 0834 12/21/20 2052 01/08/21 1300 01/09/21 0257 01/12/21 1139  WBC 6.0 7.5   < > 4.5 4.6 4.9  NEUTROABS 3,750 5.8  --   --   --  2,778  HGB 8.2* 6.2*   < > 6.9* 7.6* 9.0*  HCT 26.0* 20.6*   < > 21.8* 23.2* 28.9*  MCV 98.9 103.0*   < > 97.3 93.5 94.4  PLT 192 168   < > 145* 122* 150   < > = values in this interval not displayed.   Lipid Panel: Recent Labs    12/15/20 1105  CHOL 143  HDL 31*  LDLCALC 79  TRIG 248*  CHOLHDL 4.6   TSH: Recent Labs    02/05/20 0000 10/17/20 0139 12/15/20 1105  TSH 0.18* 0.748 0.49   A1C: Lab Results  Component Value Date   HGBA1C 4.6 12/15/2020     Assessment/Plan 1. Acute right-sided low back pain with right-sided sciatica -requesting tramadol from our office but has been getting from Dr Lorin Mercy. He has MRI order of lumbar spine, encouraged him to call to get imaging and follow up with orthopedic at this time since he has been prescribing pain medication. Pt agrees with plan.  Next appt 06/15/2021 [name]. [place], [place]  [facility] [phone]    Virtual Visit via telephone.   I connected with patient on 01/14/21 at  8:30 AM EDT by telephone and verified that I am speaking with the correct person using two identifiers.  Location: Patient: home Provider: [facility].   I discussed the limitations, risks, security and privacy concerns of performing an evaluation and management service by telephone and the availability of in person appointments. I also discussed with the patient that there may be a patient  responsible charge related to this service. The patient expressed understanding and agreed to proceed.   I discussed the assessment and treatment plan with the patient. The patient was provided an opportunity to ask questions and all were answered. The patient agreed with the plan and demonstrated an understanding of the instructions.   The patient was advised to call back or seek an in-person evaluation if the symptoms worsen or if the condition fails to improve as anticipated.  I provided 11 minutes of non-face-to-face time during this encounter.  [name]. Harle Battiest Avs printed and mailed

## 2021-01-15 DIAGNOSIS — Z992 Dependence on renal dialysis: Secondary | ICD-10-CM | POA: Diagnosis not present

## 2021-01-15 DIAGNOSIS — N186 End stage renal disease: Secondary | ICD-10-CM | POA: Diagnosis not present

## 2021-01-15 DIAGNOSIS — N2581 Secondary hyperparathyroidism of renal origin: Secondary | ICD-10-CM | POA: Diagnosis not present

## 2021-01-18 DIAGNOSIS — D631 Anemia in chronic kidney disease: Secondary | ICD-10-CM | POA: Diagnosis not present

## 2021-01-18 DIAGNOSIS — N186 End stage renal disease: Secondary | ICD-10-CM | POA: Diagnosis not present

## 2021-01-18 DIAGNOSIS — Z992 Dependence on renal dialysis: Secondary | ICD-10-CM | POA: Diagnosis not present

## 2021-01-18 DIAGNOSIS — N2581 Secondary hyperparathyroidism of renal origin: Secondary | ICD-10-CM | POA: Diagnosis not present

## 2021-01-20 DIAGNOSIS — N186 End stage renal disease: Secondary | ICD-10-CM | POA: Diagnosis not present

## 2021-01-20 DIAGNOSIS — N2581 Secondary hyperparathyroidism of renal origin: Secondary | ICD-10-CM | POA: Diagnosis not present

## 2021-01-20 DIAGNOSIS — Z992 Dependence on renal dialysis: Secondary | ICD-10-CM | POA: Diagnosis not present

## 2021-01-20 DIAGNOSIS — D631 Anemia in chronic kidney disease: Secondary | ICD-10-CM | POA: Diagnosis not present

## 2021-01-20 NOTE — Progress Notes (Deleted)
Cardiology Office Note    Date:  01/20/2021   ID:  Jacob [name], [name] 02-12-1951, MRN [id number]   PCP:  [name], NP   [place]  Cardiologist:  Jenkins Rouge, MD *** Advanced Practice Provider:  No care team member to display Electrophysiologist:  None   [phone]   No chief complaint on file.   History of Present Illness:  Jacob [name] is a 70 y.o. male CAD status post CABG x2 in 2004, DES to the SVG to RI 11/2019 ischemic cardiomyopathy ejection fraction 40 to 45% April 2020  ESRD on HD, DM 2, HIV, hypertension, tobacco abuse, alcohol.  Patient had a GI bleed secondary to AVMs was discharged 12/24/2020 AVMs required cauterization 10/25 was complicated by use of Brilinta.  He required 5 units packed RBCs and Brilinta was held for 5 days per GI.  Patient was readmitted with GI bleed 2 weeks later but left [facility] 01/09/2021  Past Medical History:  Diagnosis Date  . Acute respiratory failure ([place]) 03/01/2018  . Arthritis    "all over; mostly knees and back" (02/28/2018)  . Chronic lower back pain    stenosis  . Community acquired pneumonia 09/06/2013  . COPD (chronic obstructive pulmonary disease) ([place])   . Coronary atherosclerosis of native coronary artery    a. 02/2003 s/p CABG x 2 (VG->RI, VG->RPDA; b. 11/2019 PCI: LM nl, LAD 90d, D3 50, RI 100, LCX 100p, OM3 100 - fills via L->L collats from D2/dLAD, RCA 100p, VG->RPDA ok, VG->RI 95 (3.5x48 Synergy XD DES).  . Drug abuse ([place])    hx; tested for cocaine as recently as 2/08. says he is not using drugs now - avoided defib. for this reason   . ESRD (end stage renal disease) ([place])    Hemo M-W-F- Richarda Blade  . Fall at home 10/2020  . GERD (gastroesophageal reflux disease)    takes OTC meds as needed  . GI bleeding    a. 11/2019 EGD: angiodysplastic lesions w/ bleeding s/p argon plasma/clipping/epi inj.  . Glaucoma    uses eye drops daily  . Hepatitis B 1968   "tx'd w/isolation; caught it from  toilet stools in gym"  . HFrEF (heart failure with reduced ejection fraction) ([place])    a. 01/2019 Echo: EF 40-45%, diffuse HK, mild basal septal hypertrophy.  . History of blood transfusion 03/01/2019  . History of colon polyps    benign  . History of gout    takes Allopurinol daily as well as Colchicine-if needed (02/28/2018)  . History of kidney stones   . HTN (hypertension)    takes Coreg,Imdur.and Apresoline daily  . Human immunodeficiency virus (HIV) disease ([place]) dx'd 62   on [place] as of 12/2020.    [name] Hyperlipidemia    takes Atorvastatin daily  . Ischemic cardiomyopathy    a. 01/2019 Echo: EF 40-45%, diffuse HK, mild basal septal hypertrophy. Diast dysfxn. Nl RV size/fxn. Sev dil LA. Triv MR/TR/PR.  [name] Muscle spasm    takes Zanaflex as needed  . Myocardial infarction ([place]) ~ 2004/2005  . Nocturia   . Peripheral neuropathy    takes gabapentin daily  . Pneumonia    "at least twice" (02/28/2018)  . Syphilis, unspecified   . Type II diabetes mellitus ([place]) 2004   Lantus daily.Average fasting blood sugar 125-199  . Wears glasses   . Wears partial dentures     Past Surgical History:  Procedure Laterality Date  . AV FISTULA  PLACEMENT Left 08/02/2018   Procedure: ARTERIOVENOUS (AV) FISTULA CREATION  left arm radiocephlic;  Surgeon: Marty Heck, MD;  Location: [place];  Service: Vascular;  Laterality: Left;  . AV FISTULA PLACEMENT Left 08/01/2019   Procedure: LEFT BRACHIOCEPHALIC ARTERIOVENOUS (AV) FISTULA CREATION;  Surgeon: [name], MD;  Location: [place];  Service: Vascular;  Laterality: Left;  . BASCILIC VEIN TRANSPOSITION Left 10/03/2019   Procedure: BASILIC VEIN TRANSPOSITION LEFT SECOND STAGE;  Surgeon: [name], MD;  Location: [place];  Service: Vascular;  Laterality: Left;  . CARDIAC CATHETERIZATION  10/2002; 12/19/2004   [name] 03/08/2011  . COLONOSCOPY  2013   Dr.John Henrene Pastor   . CORONARY ARTERY BYPASS GRAFT  02/24/2003   CABG X2/notes 03/08/2011  . CORONARY  STENT INTERVENTION N/A 12/19/2019   Procedure: CORONARY STENT INTERVENTION;  Surgeon: Jettie Booze, MD;  Location: [place] CV [facility];  Service: Cardiovascular;  Laterality: N/A;  . ESOPHAGOGASTRODUODENOSCOPY (EGD) WITH PROPOFOL N/A 02/08/2019   Procedure: ESOPHAGOGASTRODUODENOSCOPY (EGD) WITH PROPOFOL;  Surgeon: Milus Banister, MD;  Location: [place];  Service: Gastroenterology;  Laterality: N/A;  . ESOPHAGOGASTRODUODENOSCOPY (EGD) WITH PROPOFOL N/A 12/22/2019   Procedure: ESOPHAGOGASTRODUODENOSCOPY (EGD) WITH PROPOFOL;  Surgeon: Lavena Bullion, DO;  Location: [place];  Service: Gastroenterology;  Laterality: N/A;  . ESOPHAGOGASTRODUODENOSCOPY (EGD) WITH PROPOFOL N/A 10/19/2020   Procedure: ESOPHAGOGASTRODUODENOSCOPY (EGD) WITH PROPOFOL;  Surgeon: Jackquline Denmark, MD;  Location[facility] ENDOSCOPY;  Service: Endoscopy;  Laterality: N/A;  . ESOPHAGOGASTRODUODENOSCOPY (EGD) WITH PROPOFOL N/A 12/22/2020   Procedure: ESOPHAGOGASTRODUODENOSCOPY (EGD) WITH PROPOFOL;  Surgeon: Gatha Mayer, MD;  Location: [place];  Service: Endoscopy;  Laterality: N/A;  . ESOPHAGOGASTRODUODENOSCOPY (EGD) WITH PROPOFOL N/A 01/09/2021   Procedure: ESOPHAGOGASTRODUODENOSCOPY (EGD) WITH PROPOFOL;  Surgeon: Irene Shipper, MD;  Location: [facility] ENDOSCOPY;  Service: Endoscopy;  Laterality: N/A;  . HEMOSTASIS CLIP PLACEMENT  12/22/2019   Procedure: HEMOSTASIS CLIP PLACEMENT;  Surgeon: Lavena Bullion, DO;  Location: [place] ENDOSCOPY;  Service: Gastroenterology;;  . HEMOSTASIS CLIP PLACEMENT  12/22/2020   Procedure: HEMOSTASIS CLIP PLACEMENT;  Surgeon: Gatha Mayer, MD;  Location: [facility] ENDOSCOPY;  Service: Endoscopy;;  . HEMOSTASIS CONTROL  12/22/2020   Procedure: HEMOSTASIS CONTROL/hemospray;  Surgeon: Gatha Mayer, MD;  Location: [place];  Service: Endoscopy;;  . HOT HEMOSTASIS N/A 02/08/2019   Procedure: HOT HEMOSTASIS (ARGON PLASMA COAGULATION/BICAP);  Surgeon: Milus Banister, MD;  Location: [facility] ENDOSCOPY;   Service: Gastroenterology;  Laterality: N/A;  . HOT HEMOSTASIS N/A 12/22/2019   Procedure: HOT HEMOSTASIS (ARGON PLASMA COAGULATION/BICAP);  Surgeon: Lavena Bullion, DO;  Location: [facility] ENDOSCOPY;  Service: Gastroenterology;  Laterality: N/A;  . HOT HEMOSTASIS N/A 10/19/2020   Procedure: HOT HEMOSTASIS (ARGON PLASMA COAGULATION/BICAP);  Surgeon: Jackquline Denmark, MD;  Location: [facility] ENDOSCOPY;  Service: Endoscopy;  Laterality: N/A;  . HOT HEMOSTASIS N/A 12/22/2020   Procedure: HOT HEMOSTASIS (ARGON PLASMA COAGULATION/BICAP);  Surgeon: Gatha Mayer, MD;  Location: [facility] ENDOSCOPY;  Service: Endoscopy;  Laterality: N/A;  . HOT HEMOSTASIS N/A 01/09/2021   Procedure: HOT HEMOSTASIS (ARGON PLASMA COAGULATION/BICAP);  Surgeon: Irene Shipper, MD;  Location: [facility] ENDOSCOPY;  Service: Endoscopy;  Laterality: N/A;  . INTERTROCHANTERIC HIP FRACTURE SURGERY Left 11/2006   [name] 03/08/2011  . INTRAVASCULAR ULTRASOUND/IVUS N/A 12/19/2019   Procedure: Intravascular Ultrasound/IVUS;  Surgeon: Jettie Booze, MD;  Location: [place] CV LAB;  Service: Cardiovascular;  Laterality: N/A;  . IR FLUORO GUIDE CV LINE RIGHT  07/24/2019  . IR FLUORO GUIDE CV LINE RIGHT  07/30/2019  . IR US GUIDE VASC ACCESS RIGHT  07/24/2019  . IR US GUIDE VASC ACCESS RIGHT  07/30/2019  . LAPAROSCOPIC CHOLECYSTECTOMY  05/2006  . LIGATION OF COMPETING BRANCHES OF ARTERIOVENOUS FISTULA Left 11/05/2018   Procedure: LIGATION OF COMPETING BRANCHES OF ARTERIOVENOUS FISTULA  LEFT  ARM;  Surgeon: Marty Heck, MD;  Location: [place];  Service: Vascular;  Laterality: Left;  . LUMBAR LAMINECTOMY/DECOMPRESSION MICRODISCECTOMY N/A 02/29/2016   Procedure: Left L4-5 Lateral Recess Decompression, Removal Extradural Intraspinal Facet Cyst;  Surgeon: Marybelle Killings, MD;  Location: [place];  Service: Orthopedics;  Laterality: N/A;  . MULTIPLE TOOTH EXTRACTIONS    . ORIF MANDIBULAR FRACTURE Left 08/13/2004   ORIF of left body fracture mandible with KLS Martin  2.3-mm six hole/notes 03/08/2011  . RIGHT/LEFT HEART CATH AND CORONARY/GRAFT ANGIOGRAPHY N/A 12/19/2019   Procedure: RIGHT/LEFT HEART CATH AND CORONARY/GRAFT ANGIOGRAPHY;  Surgeon: Jettie Booze, MD;  Location: [place] [facility];  Service: Cardiovascular;  Laterality: N/A;  . SCLEROTHERAPY  12/22/2019   Procedure: Clide Deutscher;  Surgeon: Lavena Bullion, DO;  Location: [place] ENDOSCOPY;  Service: Gastroenterology;;    Current Medications: No outpatient medications have been marked as taking for the 01/26/21 encounter (Appointment) with Imogene Burn, PA-C.     Allergies:   Augmentin [amoxicillin-pot clavulanate], Mucinex fast-max, and Amphetamines   Social History   Socioeconomic History  . Marital status: Single    Spouse name: Not on file  . Number of children: 1  . Years of education: Not on file  . Highest education level: Not on file  Occupational History  . Occupation: retired  Tobacco Use  . Smoking status: Current Every Day Smoker    Packs/day: 0.50    Years: 43.00    Pack years: 21.50    Types: Cigarettes  . Smokeless tobacco: Never Used  Vaping Use  . Vaping Use: Never used  Substance and Sexual Activity  . Alcohol use: Not Currently    Alcohol/week: 12.0 standard drinks    Types: 12 Standard drinks or equivalent per week  . Drug use: Not Currently    Types: Cocaine    Comment: hx of crack/cocaine 75yrs ago 10/01/2019- none  . Sexual activity: Yes    Partners: Male  Other Topics Concern  . Not on file  Social History Narrative   Retired.       As of 05/2015:   Diet: No salt    Caffeine   Married: Single    House: Condo, 2 stories, 1 person (self)    Pets: No    Current/Past profession: N/A   Exercise: walks daily    Living Will: Yes    DNR   POA/HPOA: No       Social Determinants of Health   Financial Resource Strain: Low Risk   . Difficulty of Paying Living Expenses: Not hard at all  Food Insecurity: No Food Insecurity  . Worried About  Charity fundraiser in the Last Year: Never true  . Ran Out of Food in the Last Year: Never true  Transportation Needs: No Transportation Needs  . Lack of Transportation (Medical): No  . Lack of Transportation (Non-Medical): No  Physical Activity: Not on file  Stress: Not on file  Social Connections: Not on file     Family History:  The patient's ***family history includes Alzheimer's disease in his mother and sister; Diabetes in his brother, brother, and sister; Drug abuse in his brother; Heart attack in his  brother; Heart failure in his father; Hypertension in his brother and father; Stroke in his sister.   ROS:   Please see the history of present illness.    ROS All other systems reviewed and are negative.   PHYSICAL EXAM:   VS:  There were no vitals taken for this visit.  Physical Exam  GEN: Well nourished, well developed, in no acute distress  HEENT: normal  Neck: no JVD, carotid bruits, or masses Cardiac:RRR; no murmurs, rubs, or gallops  Respiratory:  clear to auscultation bilaterally, normal work of breathing GI: soft, nontender, nondistended, + BS Ext: without cyanosis, clubbing, or edema, Good distal pulses bilaterally MS: no deformity or atrophy  Skin: warm and dry, no rash Neuro:  Alert and Oriented x 3, Strength and sensation are intact Psych: euthymic mood, full affect  Wt Readings from Last 3 Encounters:  01/12/21 173 lb 6.4 oz (78.7 kg)  01/09/21 168 lb 10.4 oz (76.5 kg)  12/24/20 177 lb 4 oz (80.4 kg)      Studies/Labs Reviewed:   EKG:  EKG is*** ordered today.  The ekg ordered today demonstrates ***  Recent Labs: 12/15/2020: TSH 0.49 01/07/2021: ALT 13 01/08/2021: Magnesium 2.2 01/12/2021: BUN 44; Creat 5.13; Hemoglobin 9.0; Platelets 150; Potassium 4.0; Sodium 136   Lipid Panel    Component Value Date/Time   CHOL 143 12/15/2020 1105   CHOL 121 08/07/2015 1041   TRIG 248 (H) 12/15/2020 1105   HDL 31 (L) 12/15/2020 1105   HDL 32 (L) 08/07/2015 1041    CHOLHDL 4.6 12/15/2020 1105   VLDL 19 12/21/2019 0352   LDLCALC 79 12/15/2020 1105    Additional studies/ records that were reviewed today include:  Echo 10/18/20 IMPRESSIONS     1. Left ventricular ejection fraction, by estimation, is 40 to 45%. The  left ventricle has mildly decreased function. The left ventricle  demonstrates regional wall motion abnormalities (see scoring  diagram/findings for description). The left ventricular   internal cavity size was moderately dilated. There is moderate concentric  left ventricular hypertrophy. Left ventricular diastolic parameters are  consistent with Grade II diastolic dysfunction (pseudonormalization).  Elevated left atrial pressure.   2. Right ventricular systolic function is normal. The right ventricular  size is normal. There is normal pulmonary artery systolic pressure.   3. Left atrial size was moderately dilated.   4. The mitral valve is normal in structure. Mild mitral valve  regurgitation. No evidence of mitral stenosis.   5. The aortic valve is normal in structure. Aortic valve regurgitation is  not visualized. No aortic stenosis is present.   6. The inferior vena cava is dilated in size with >50% respiratory  variability, suggesting right atrial pressure of 8 mmHg.     Risk Assessment/Calculations:   {Does this patient have ATRIAL FIBRILLATION?[phone]}     ASSESSMENT:    No diagnosis found.   PLAN:  In order of problems listed above:  CAD status post CABG x2 in 2004, DES to the SVG to RI 11/2019 Brilinta and aspirin  Recurrent GI bleeds 3 since December most recent in March required 5 units transfusion.  On Brilinta.  Ischemic cardiomyopathy ejection fraction 40 to 45%  Hypertension  ESRD on HD  Shared Decision Making/Informed Consent   {Are you ordering a CV Procedure (e.g. stress test, cath, DCCV, TEE, etc)?   Press F2        :[id number]}    Medication Adjustments/Labs and Tests Ordered: Current  medicines are reviewed at length  with the patient today.  Concerns regarding medicines are outlined above.  Medication changes, Labs and Tests ordered today are listed in the Patient Instructions below. There are no Patient Instructions on file for this visit.   Signed, Ermalinda Barrios, PA-C  01/20/2021 2:14 PM    [facility] [address], [place], [place]  [address] Phone: [phone]; Fax: [phone]

## 2021-01-21 DIAGNOSIS — Z992 Dependence on renal dialysis: Secondary | ICD-10-CM | POA: Diagnosis not present

## 2021-01-21 DIAGNOSIS — I129 Hypertensive chronic kidney disease with stage 1 through stage 4 chronic kidney disease, or unspecified chronic kidney disease: Secondary | ICD-10-CM | POA: Diagnosis not present

## 2021-01-21 DIAGNOSIS — N186 End stage renal disease: Secondary | ICD-10-CM | POA: Diagnosis not present

## 2021-01-21 LAB — CBC AND DIFFERENTIAL: Hemoglobin: 7.9 — AB (ref 13.5–17.5)

## 2021-01-22 ENCOUNTER — Telehealth: Payer: Self-pay

## 2021-01-22 DIAGNOSIS — D62 Acute posthemorrhagic anemia: Secondary | ICD-10-CM

## 2021-01-22 DIAGNOSIS — N186 End stage renal disease: Secondary | ICD-10-CM | POA: Diagnosis not present

## 2021-01-22 DIAGNOSIS — N2581 Secondary hyperparathyroidism of renal origin: Secondary | ICD-10-CM | POA: Diagnosis not present

## 2021-01-22 DIAGNOSIS — Z992 Dependence on renal dialysis: Secondary | ICD-10-CM | POA: Diagnosis not present

## 2021-01-22 MED ORDER — IRON (FERROUS SULFATE) 325 (65 FE) MG PO TABS: 325.0000 mg | ORAL_TABLET | Freq: Two times a day (BID) | ORAL | 11 refills | Status: DC

## 2021-01-22 NOTE — Telephone Encounter (Signed)
Patient is aware of Jessica's response and verbalized understanding. Patient asked that I send rx for Iron to see if insurance will conver  High risk or very high risk warning populated when attempting to refill medication. RX request sent to PCP for review and approval if warranted.

## 2021-01-22 NOTE — Telephone Encounter (Signed)
Patient called to inform [name] that [facility] denied his referral with the reasoning that they will perform the same actions as [facility].  Patient also wanted to let you know that he will see Dr.Yates on 02/02/2021 for hip pain.  Patient states he will call me back after 11 am to see what [name], NP reply is as he was at dialysis at the time of call.

## 2021-01-22 NOTE — Telephone Encounter (Signed)
Labs are available to view under media

## 2021-01-22 NOTE — Telephone Encounter (Signed)
Thank you for the update!

## 2021-01-22 NOTE — Telephone Encounter (Signed)
Spoke with patient, patient states he had 8 surgeries by [facility] and they are not helping him . Patient is questioning if he needs to go to [facility] outpatient for low hemoglobin to receive blood. Patient states his hemoglobin was about 7.9 per the labs from the dialysis center and they are planning to give him iron on Monday.   I called Dialysis Center and requested labs results

## 2021-01-22 NOTE — Telephone Encounter (Signed)
He can try an iron supplement. Ferrous sulfate 325 mg by mouth twice daily-- this is over the counter. His iron stores are low so the iron transfusion will be the best thing for him. If he starts having symptoms such as shortness of breath, chest pains, overwhelming fatigue or blood in the stools would recommend going to the hospital to be evaluated.

## 2021-01-22 NOTE — Telephone Encounter (Signed)
Patient called back stating that he spoke with the nurse from dialysis and was told he can not take an iron supplement in combination with dialysis treatment   I canceled rx off medication list and called the pharmacy to cancel (left message on pharmacy/provider line)

## 2021-01-22 NOTE — Telephone Encounter (Signed)
Patient called x 3 and left messages on clinical intake stating he needs to know what [name] is going to do to help him. Patient asked if [name] can prescribe an iron supplement. Patient states he had issues with iron supplement in the past with diarrhea yet he is willing to try it again .   Please advise to this message and previous message regarding request for outpatient blood transfusion

## 2021-01-24 ENCOUNTER — Encounter (HOSPITAL_COMMUNITY): Payer: Self-pay

## 2021-01-24 ENCOUNTER — Observation Stay (HOSPITAL_COMMUNITY): Payer: Medicare Other

## 2021-01-24 ENCOUNTER — Inpatient Hospital Stay (HOSPITAL_COMMUNITY)
Admission: EM | Admit: 2021-01-24 | Discharge: 2021-01-26 | DRG: 377 | Disposition: A | Discharge status: Home Health | Payer: Medicare Other | Attending: Internal Medicine | Admitting: Internal Medicine

## 2021-01-24 ENCOUNTER — Other Ambulatory Visit: Payer: Self-pay

## 2021-01-24 DIAGNOSIS — I252 Old myocardial infarction: Secondary | ICD-10-CM

## 2021-01-24 DIAGNOSIS — E1122 Type 2 diabetes mellitus with diabetic chronic kidney disease: Secondary | ICD-10-CM | POA: Diagnosis present

## 2021-01-24 DIAGNOSIS — Z955 Presence of coronary angioplasty implant and graft: Secondary | ICD-10-CM

## 2021-01-24 DIAGNOSIS — B20 Human immunodeficiency virus [HIV] disease: Secondary | ICD-10-CM | POA: Diagnosis not present

## 2021-01-24 DIAGNOSIS — N2581 Secondary hyperparathyroidism of renal origin: Secondary | ICD-10-CM | POA: Diagnosis not present

## 2021-01-24 DIAGNOSIS — I10 Essential (primary) hypertension: Secondary | ICD-10-CM | POA: Diagnosis present

## 2021-01-24 DIAGNOSIS — I5042 Chronic combined systolic (congestive) and diastolic (congestive) heart failure: Secondary | ICD-10-CM | POA: Diagnosis present

## 2021-01-24 DIAGNOSIS — Z9049 Acquired absence of other specified parts of digestive tract: Secondary | ICD-10-CM

## 2021-01-24 DIAGNOSIS — H409 Unspecified glaucoma: Secondary | ICD-10-CM | POA: Diagnosis present

## 2021-01-24 DIAGNOSIS — K922 Gastrointestinal hemorrhage, unspecified: Secondary | ICD-10-CM | POA: Diagnosis present

## 2021-01-24 DIAGNOSIS — Z8616 Personal history of COVID-19: Secondary | ICD-10-CM | POA: Diagnosis not present

## 2021-01-24 DIAGNOSIS — Z7902 Long term (current) use of antithrombotics/antiplatelets: Secondary | ICD-10-CM | POA: Diagnosis not present

## 2021-01-24 DIAGNOSIS — K219 Gastro-esophageal reflux disease without esophagitis: Secondary | ICD-10-CM | POA: Diagnosis not present

## 2021-01-24 DIAGNOSIS — Z992 Dependence on renal dialysis: Secondary | ICD-10-CM

## 2021-01-24 DIAGNOSIS — Z79899 Other long term (current) drug therapy: Secondary | ICD-10-CM | POA: Diagnosis not present

## 2021-01-24 DIAGNOSIS — E785 Hyperlipidemia, unspecified: Secondary | ICD-10-CM | POA: Diagnosis not present

## 2021-01-24 DIAGNOSIS — I2510 Atherosclerotic heart disease of native coronary artery without angina pectoris: Secondary | ICD-10-CM | POA: Diagnosis not present

## 2021-01-24 DIAGNOSIS — Z951 Presence of aortocoronary bypass graft: Secondary | ICD-10-CM

## 2021-01-24 DIAGNOSIS — Z21 Asymptomatic human immunodeficiency virus [HIV] infection status: Secondary | ICD-10-CM | POA: Diagnosis present

## 2021-01-24 DIAGNOSIS — F17210 Nicotine dependence, cigarettes, uncomplicated: Secondary | ICD-10-CM | POA: Diagnosis present

## 2021-01-24 DIAGNOSIS — N186 End stage renal disease: Secondary | ICD-10-CM

## 2021-01-24 DIAGNOSIS — E1142 Type 2 diabetes mellitus with diabetic polyneuropathy: Secondary | ICD-10-CM

## 2021-01-24 DIAGNOSIS — Z794 Long term (current) use of insulin: Secondary | ICD-10-CM

## 2021-01-24 DIAGNOSIS — I132 Hypertensive heart and chronic kidney disease with heart failure and with stage 5 chronic kidney disease, or end stage renal disease: Secondary | ICD-10-CM | POA: Diagnosis present

## 2021-01-24 DIAGNOSIS — D62 Acute posthemorrhagic anemia: Secondary | ICD-10-CM | POA: Diagnosis present

## 2021-01-24 DIAGNOSIS — D649 Anemia, unspecified: Secondary | ICD-10-CM

## 2021-01-24 DIAGNOSIS — E1140 Type 2 diabetes mellitus with diabetic neuropathy, unspecified: Secondary | ICD-10-CM

## 2021-01-24 DIAGNOSIS — Z743 Need for continuous supervision: Secondary | ICD-10-CM | POA: Diagnosis not present

## 2021-01-24 DIAGNOSIS — R0602 Shortness of breath: Secondary | ICD-10-CM

## 2021-01-24 DIAGNOSIS — Q2730 Arteriovenous malformation, site unspecified: Secondary | ICD-10-CM

## 2021-01-24 DIAGNOSIS — I517 Cardiomegaly: Secondary | ICD-10-CM | POA: Diagnosis not present

## 2021-01-24 DIAGNOSIS — Z833 Family history of diabetes mellitus: Secondary | ICD-10-CM | POA: Diagnosis not present

## 2021-01-24 DIAGNOSIS — I255 Ischemic cardiomyopathy: Secondary | ICD-10-CM | POA: Diagnosis present

## 2021-01-24 DIAGNOSIS — J449 Chronic obstructive pulmonary disease, unspecified: Secondary | ICD-10-CM | POA: Diagnosis present

## 2021-01-24 DIAGNOSIS — M109 Gout, unspecified: Secondary | ICD-10-CM | POA: Diagnosis present

## 2021-01-24 DIAGNOSIS — R197 Diarrhea, unspecified: Secondary | ICD-10-CM | POA: Diagnosis not present

## 2021-01-24 DIAGNOSIS — K31811 Angiodysplasia of stomach and duodenum with bleeding: Principal | ICD-10-CM | POA: Diagnosis present

## 2021-01-24 DIAGNOSIS — R6889 Other general symptoms and signs: Secondary | ICD-10-CM | POA: Diagnosis not present

## 2021-01-24 DIAGNOSIS — E8889 Other specified metabolic disorders: Secondary | ICD-10-CM | POA: Diagnosis present

## 2021-01-24 LAB — CBC WITH DIFFERENTIAL/PLATELET
Abs Immature Granulocytes: 0.02 10*3/uL (ref 0.00–0.07)
Basophils Absolute: 0 10*3/uL (ref 0.0–0.1)
Basophils Relative: 1 %
Eosinophils Absolute: 0.1 10*3/uL (ref 0.0–0.5)
Eosinophils Relative: 3 %
HCT: 22.1 % — ABNORMAL LOW (ref 39.0–52.0)
Hemoglobin: 6.7 g/dL — CL (ref 13.0–17.0)
Immature Granulocytes: 0 %
Lymphocytes Relative: 23 %
Lymphs Abs: 1.1 10*3/uL (ref 0.7–4.0)
MCH: 27.9 pg (ref 26.0–34.0)
MCHC: 30.3 g/dL (ref 30.0–36.0)
MCV: 92.1 fL (ref 80.0–100.0)
Monocytes Absolute: 0.5 10*3/uL (ref 0.1–1.0)
Monocytes Relative: 10 %
Neutro Abs: 3.1 10*3/uL (ref 1.7–7.7)
Neutrophils Relative %: 63 %
Platelets: 178 10*3/uL (ref 150–400)
RBC: 2.4 MIL/uL — ABNORMAL LOW (ref 4.22–5.81)
RDW: 14.6 % (ref 11.5–15.5)
WBC: 4.9 10*3/uL (ref 4.0–10.5)
nRBC: 0 % (ref 0.0–0.2)

## 2021-01-24 LAB — BASIC METABOLIC PANEL
Anion gap: 16 — ABNORMAL HIGH (ref 5–15)
BUN: 75 mg/dL — ABNORMAL HIGH (ref 8–23)
CO2: 26 mmol/L (ref 22–32)
Calcium: 8.9 mg/dL (ref 8.9–10.3)
Chloride: 95 mmol/L — ABNORMAL LOW (ref 98–111)
Creatinine, Ser: 8.39 mg/dL — ABNORMAL HIGH (ref 0.61–1.24)
GFR, Estimated: 6 mL/min — ABNORMAL LOW (ref >60)
Glucose, Bld: 156 mg/dL — ABNORMAL HIGH (ref 70–99)
Potassium: 4.9 mmol/L (ref 3.5–5.1)
Sodium: 137 mmol/L (ref 135–145)

## 2021-01-24 LAB — GLUCOSE, CAPILLARY: Glucose-Capillary: 184 mg/dL — ABNORMAL HIGH (ref 70–99)

## 2021-01-24 LAB — PREPARE RBC (CROSSMATCH)

## 2021-01-24 LAB — CBG MONITORING, ED: Glucose-Capillary: 95 mg/dL (ref 70–99)

## 2021-01-24 LAB — POC OCCULT BLOOD, ED: Fecal Occult Bld: POSITIVE — AB

## 2021-01-24 MED ORDER — CALCIUM ACETATE (PHOS BINDER) 667 MG PO CAPS
1334.0000 mg | ORAL_CAPSULE | Freq: Three times a day (TID) | ORAL | Status: DC
  Administered 2021-01-24 – 2021-01-26 (×4): 1334 mg via ORAL
  Filled 2021-01-24 (×4): qty 2

## 2021-01-24 MED ORDER — CALCIUM ACETATE 667 MG PO TABS: 667.0000 mg | ORAL_TABLET | ORAL | Status: DC

## 2021-01-24 MED ORDER — FOLIC ACID 1 MG PO TABS
1.0000 mg | ORAL_TABLET | Freq: Every day | ORAL | Status: DC
  Administered 2021-01-25 – 2021-01-26 (×2): 1 mg via ORAL
  Filled 2021-01-24 (×2): qty 1

## 2021-01-24 MED ORDER — LATANOPROST 0.005 % OP SOLN
1.0000 [drp] | Freq: Every day | OPHTHALMIC | Status: DC
  Administered 2021-01-24 – 2021-01-25 (×2): 1 [drp] via OPHTHALMIC
  Filled 2021-01-24: qty 2.5

## 2021-01-24 MED ORDER — ALLOPURINOL 100 MG PO TABS
100.0000 mg | ORAL_TABLET | Freq: Every day | ORAL | Status: DC
  Administered 2021-01-25 – 2021-01-26 (×2): 100 mg via ORAL
  Filled 2021-01-24 (×2): qty 1

## 2021-01-24 MED ORDER — INSULIN ASPART 100 UNIT/ML ~~LOC~~ SOLN
0.0000 [IU] | Freq: Three times a day (TID) | SUBCUTANEOUS | Status: DC
  Administered 2021-01-25 – 2021-01-26 (×2): 1 [IU] via SUBCUTANEOUS

## 2021-01-24 MED ORDER — ONDANSETRON HCL 4 MG/2ML IJ SOLN: 4.0000 mg | Freq: Four times a day (QID) | INTRAMUSCULAR | Status: DC | PRN

## 2021-01-24 MED ORDER — CARVEDILOL 3.125 MG PO TABS
3.1250 mg | ORAL_TABLET | Freq: Two times a day (BID) | ORAL | Status: DC
  Administered 2021-01-24 – 2021-01-26 (×4): 3.125 mg via ORAL
  Filled 2021-01-24 (×4): qty 1

## 2021-01-24 MED ORDER — TIMOLOL MALEATE 0.5 % OP SOLN
1.0000 [drp] | Freq: Every morning | OPHTHALMIC | Status: DC
  Administered 2021-01-25 – 2021-01-26 (×2): 1 [drp] via OPHTHALMIC
  Filled 2021-01-24: qty 5

## 2021-01-24 MED ORDER — GABAPENTIN 300 MG PO CAPS
300.0000 mg | ORAL_CAPSULE | Freq: Every day | ORAL | Status: DC
  Administered 2021-01-24 – 2021-01-26 (×3): 300 mg via ORAL
  Filled 2021-01-24 (×3): qty 1

## 2021-01-24 MED ORDER — ONDANSETRON HCL 4 MG PO TABS: 4.0000 mg | ORAL_TABLET | Freq: Four times a day (QID) | ORAL | Status: DC | PRN

## 2021-01-24 MED ORDER — PANTOPRAZOLE SODIUM 40 MG PO TBEC
40.0000 mg | DELAYED_RELEASE_TABLET | Freq: Two times a day (BID) | ORAL | Status: DC
  Administered 2021-01-24 – 2021-01-26 (×3): 40 mg via ORAL
  Filled 2021-01-24 (×3): qty 1

## 2021-01-24 MED ORDER — BICTEGRAVIR-EMTRICITAB-TENOFOV 50-200-25 MG PO TABS
1.0000 | ORAL_TABLET | Freq: Every day | ORAL | Status: DC
  Administered 2021-01-25 – 2021-01-26 (×2): 1 via ORAL
  Filled 2021-01-24 (×2): qty 1

## 2021-01-24 MED ORDER — DICLOFENAC SODIUM 1 % EX GEL
2.0000 g | Freq: Three times a day (TID) | CUTANEOUS | Status: DC | PRN
  Filled 2021-01-24: qty 100

## 2021-01-24 MED ORDER — CALCIUM ACETATE (PHOS BINDER) 667 MG PO CAPS
667.0000 mg | ORAL_CAPSULE | ORAL | Status: DC
  Administered 2021-01-25: 667 mg via ORAL
  Filled 2021-01-24: qty 1

## 2021-01-24 MED ORDER — PANTOPRAZOLE SODIUM 40 MG IV SOLR
40.0000 mg | Freq: Once | INTRAVENOUS | Status: AC
  Administered 2021-01-24: 40 mg via INTRAVENOUS
  Filled 2021-01-24: qty 40

## 2021-01-24 MED ORDER — ISOSORBIDE MONONITRATE ER 30 MG PO TB24
30.0000 mg | ORAL_TABLET | Freq: Every day | ORAL | Status: DC
  Administered 2021-01-25 – 2021-01-26 (×2): 30 mg via ORAL
  Filled 2021-01-24 (×2): qty 1

## 2021-01-24 MED ORDER — CHLORHEXIDINE GLUCONATE CLOTH 2 % EX PADS
6.0000 | MEDICATED_PAD | Freq: Every day | CUTANEOUS | Status: DC
  Administered 2021-01-25 – 2021-01-26 (×2): 6 via TOPICAL

## 2021-01-24 MED ORDER — UMECLIDINIUM-VILANTEROL 62.5-25 MCG/INH IN AEPB
1.0000 | INHALATION_SPRAY | Freq: Every day | RESPIRATORY_TRACT | Status: DC
  Administered 2021-01-25 – 2021-01-26 (×2): 1 via RESPIRATORY_TRACT
  Filled 2021-01-24: qty 14

## 2021-01-24 MED ORDER — SODIUM CHLORIDE 0.9 % IV SOLN
125.0000 mg | INTRAVENOUS | Status: DC
  Administered 2021-01-25: 125 mg via INTRAVENOUS
  Filled 2021-01-24: qty 10

## 2021-01-24 MED ORDER — ALBUTEROL SULFATE (2.5 MG/3ML) 0.083% IN NEBU: 2.5000 mg | INHALATION_SOLUTION | Freq: Four times a day (QID) | RESPIRATORY_TRACT | Status: DC | PRN

## 2021-01-24 MED ORDER — SODIUM CHLORIDE 0.9% FLUSH
3.0000 mL | Freq: Two times a day (BID) | INTRAVENOUS | Status: DC
  Administered 2021-01-24 – 2021-01-25 (×2): 3 mL via INTRAVENOUS

## 2021-01-24 MED ORDER — SODIUM CHLORIDE 0.9% IV SOLUTION: Freq: Once | INTRAVENOUS | Status: AC

## 2021-01-24 NOTE — ED Provider Notes (Signed)
[facility] EMERGENCY DEPARTMENT Provider Note   CSN: [id number] Arrival date & time: 01/24/21  4801     History Chief Complaint  Patient presents with  . GI Bleeding    Jacob [name] is a 70 y.o. male.  Pt is a 70y/o male with hx of ESRD on HD MWF, HIV, COPD, diabetes mellitus type 2, CAD had been on Brilinta but most recently on ASA and history of recurrent GI bleed secondary to AVM.  Patient had AVMs cauterized in December 2021, EGD done on 3/1 showed duodenal bulb nodule with steady nonpulsatile bleeding and was treated with APC and clipping x4.  Patient was discharged home on Protonix with Brilinta being held for 5 days, but then returned for recurrent bleeding and [place] did an EGD but did not need ongoing therapy.  Pt reports since last hospitalization he has been on ASA.  He noted a black stool last night and 1 this morning with weakness and feeling dizzy and slightly SOB with standing.  He denies any abd pain.  No chest pain.  Last dialysis was Friday and full course.  He reports this is exactly like the prior issues.        Past Medical History:  Diagnosis Date  . Acute respiratory failure ([place]) 03/01/2018  . Arthritis    "all over; mostly knees and back" (02/28/2018)  . Chronic lower back pain    stenosis  . Community acquired pneumonia 09/06/2013  . COPD (chronic obstructive pulmonary disease) ([place])   . Coronary atherosclerosis of native coronary artery    a. 02/2003 s/p CABG x 2 (VG->RI, VG->RPDA; b. 11/2019 PCI: LM nl, LAD 90d, D3 50, RI 100, LCX 100p, OM3 100 - fills via L->L collats from D2/dLAD, RCA 100p, VG->RPDA ok, VG->RI 95 (3.5x48 Synergy XD DES).  . Drug abuse ([place])    hx; tested for cocaine as recently as 2/08. says he is not using drugs now - avoided defib. for this reason   . ESRD (end stage renal disease) ([place])    Hemo M-W-F- Richarda Blade  . Fall at home 10/2020  . GERD (gastroesophageal reflux disease)    takes OTC meds as needed  . GI  bleeding    a. 11/2019 EGD: angiodysplastic lesions w/ bleeding s/p argon plasma/clipping/epi inj.  . Glaucoma    uses eye drops daily  . Hepatitis B 1968   "tx'd w/isolation; caught it from toilet stools in gym"  . HFrEF (heart failure with reduced ejection fraction) ([place])    a. 01/2019 Echo: EF 40-45%, diffuse HK, mild basal septal hypertrophy.  . History of blood transfusion 03/01/2019  . History of colon polyps    benign  . History of gout    takes Allopurinol daily as well as Colchicine-if needed (02/28/2018)  . History of kidney stones   . HTN (hypertension)    takes Coreg,Imdur.and Apresoline daily  . Human immunodeficiency virus (HIV) disease ([place]) dx'd 14   on [place] as of 12/2020.    [name] Hyperlipidemia    takes Atorvastatin daily  . Ischemic cardiomyopathy    a. 01/2019 Echo: EF 40-45%, diffuse HK, mild basal septal hypertrophy. Diast dysfxn. Nl RV size/fxn. Sev dil LA. Triv MR/TR/PR.  [name] Muscle spasm    takes Zanaflex as needed  . Myocardial infarction ([place]) ~ 2004/2005  . Nocturia   . Peripheral neuropathy    takes gabapentin daily  . Pneumonia    "at least twice" (02/28/2018)  . Syphilis, unspecified   .  Type II diabetes mellitus ([place]) 2004   Lantus daily.Average fasting blood sugar 125-199  . Wears glasses   . Wears partial dentures     Patient Active Problem List   Diagnosis Date Noted  . Gastric hemorrhage due to angiodysplasia of stomach   . Acute upper GI bleed 11/24/2020  . COVID-19 virus infection 11/24/2020  . Other mechanical complication of cranial or spinal infusion catheter, sequela 11/11/2020  . Subdural hematoma ([place]) 11/06/2020  . Fall at home, initial encounter 11/05/2020  . Nicotine dependence, cigarettes, uncomplicated 41/96/2229  . Alcohol abuse 11/05/2020  . GERD without esophagitis 11/05/2020  . Unspecified trochanteric fracture of left femur, initial encounter for closed fracture ([place]) 11/05/2020  . Traumatic subdural hematoma, initial  encounter ([place]) 11/05/2020  . Fracture of metatarsal of right foot, closed 11/05/2020  . Nondisplaced fracture of greater trochanter of left femur, initial encounter for closed fracture ([place])   . Uremia   . AVM (arteriovenous malformation) of small bowel, acquired with hemorrhage   . Fluid overload 10/16/2020  . Pruritus, unspecified 10/02/2020  . Encounter for removal of sutures 03/05/2020  . Cardiomyopathy ([place]) 12/21/2019  . Iron deficiency anemia   . Acute blood loss anemia   . Acute on chronic anemia   . AVM (arteriovenous malformation) of duodenum, acquired   . Abnormal nuclear stress test 12/19/2019  . Pain, unspecified 10/04/2019  . Type 2 diabetes mellitus with diabetic neuropathy, unspecified ([place]) 08/07/2019  . Allergy, unspecified, initial encounter 08/02/2019  . Complication of vascular dialysis catheter 08/02/2019  . Drug abuse counseling and surveillance of drug abuser 08/02/2019  . Other specified coagulation defects ([place]) 08/02/2019  . Secondary hyperparathyroidism of renal origin ([place]) 08/02/2019  . Unspecified atrial fibrillation ([place]) 08/02/2019  . ESRD on hemodialysis ([place])   . Hand pain, left   . Cocaine abuse ([place])   . Hypertensive emergency   . CHF exacerbation ([place]) 07/21/2019  . Diabetic foot ulcer ([place]) 02/11/2019  . Gastric AVM   . Melena 02/07/2019  . CKD stage 4 due to type 2 diabetes mellitus ([place]) 05/30/2018  . Hyperlipidemia associated with type 2 diabetes mellitus ([place]) 05/30/2018  . Right foot ulcer ([place]) 02/28/2018  . Chronic obstructive pulmonary disease ([place]) 02/28/2018  . Anemia of chronic disease 11/20/2016  . CKD (chronic kidney disease) stage 3, GFR 30-59 ml/min (HCC) 11/20/2016  . CHF (congestive heart failure) ([place]) 11/10/2016  . History of lumbar laminectomy for spinal cord decompression 02/29/2016  . Type 2 diabetes mellitus with diabetic polyneuropathy, with long-term current use of insulin ([place]) 06/17/2015  . HTN (hypertension)  04/26/2015  . DM (diabetes mellitus) ([place]) 04/26/2015  . Ischemic cardiomyopathy 05/12/2014  . Ulcer of lower extremity ([place]) 09/06/2013  . Hyperkalemia 09/06/2013  . Chest pain 09/06/2013  . Type II or unspecified type diabetes mellitus with unspecified complication, not stated as uncontrolled 10/22/2012  . Bunion of left foot 11/25/2011  . Bunion, right foot 11/25/2011  . Polysubstance abuse ([place]) 07/28/2011  . Hip fracture, left ([place]) 04/04/2011  . Closed fracture of neck of femur ([place]) 04/04/2011  . Insomnia 02/18/2011  . Coronary artery disease involving native heart without angina pectoris 04/20/2009  . Chronic combined systolic (congestive) and diastolic (congestive) heart failure ([place]) 04/20/2009  . Mixed hyperlipidemia 11/20/2006  . Gout 11/20/2006  . TOBACCO ABUSE 11/20/2006  . Essential hypertension 11/20/2006  . Human immunodeficiency virus (HIV) disease ([place]) 09/02/2006    Past Surgical History:  Procedure Laterality Date  . AV FISTULA PLACEMENT  Left 08/02/2018   Procedure: ARTERIOVENOUS (AV) FISTULA CREATION  left arm radiocephlic;  Surgeon: Marty Heck, MD;  Location: [facility];  Service: Vascular;  Laterality: Left;  . AV FISTULA PLACEMENT Left 08/01/2019   Procedure: LEFT BRACHIOCEPHALIC ARTERIOVENOUS (AV) FISTULA CREATION;  Surgeon: [name], MD;  Location: [facility];  Service: Vascular;  Laterality: Left;  . BASCILIC VEIN TRANSPOSITION Left 10/03/2019   Procedure: BASILIC VEIN TRANSPOSITION LEFT SECOND STAGE;  Surgeon: [name], MD;  Location: [facility];  Service: Vascular;  Laterality: Left;  . CARDIAC CATHETERIZATION  10/2002; 12/19/2004   [name] 03/08/2011  . COLONOSCOPY  2013   Dr.John Henrene Pastor   . CORONARY ARTERY BYPASS GRAFT  02/24/2003   CABG X2/notes 03/08/2011  . CORONARY STENT INTERVENTION N/A 12/19/2019   Procedure: CORONARY STENT INTERVENTION;  Surgeon: Jettie Booze, MD;  Location: [facility] CV LAB;  Service: Cardiovascular;  Laterality:  N/A;  . ESOPHAGOGASTRODUODENOSCOPY (EGD) WITH PROPOFOL N/A 02/08/2019   Procedure: ESOPHAGOGASTRODUODENOSCOPY (EGD) WITH PROPOFOL;  Surgeon: Milus Banister, MD;  Location: [facility];  Service: Gastroenterology;  Laterality: N/A;  . ESOPHAGOGASTRODUODENOSCOPY (EGD) WITH PROPOFOL N/A 12/22/2019   Procedure: ESOPHAGOGASTRODUODENOSCOPY (EGD) WITH PROPOFOL;  Surgeon: Lavena Bullion, DO;  Location: [facility];  Service: Gastroenterology;  Laterality: N/A;  . ESOPHAGOGASTRODUODENOSCOPY (EGD) WITH PROPOFOL N/A 10/19/2020   Procedure: ESOPHAGOGASTRODUODENOSCOPY (EGD) WITH PROPOFOL;  Surgeon: Jackquline Denmark, MD;  Location: [facility] ENDOSCOPY;  Service: Endoscopy;  Laterality: N/A;  . ESOPHAGOGASTRODUODENOSCOPY (EGD) WITH PROPOFOL N/A 12/22/2020   Procedure: ESOPHAGOGASTRODUODENOSCOPY (EGD) WITH PROPOFOL;  Surgeon: Gatha Mayer, MD;  Location: [facility];  Service: Endoscopy;  Laterality: N/A;  . ESOPHAGOGASTRODUODENOSCOPY (EGD) WITH PROPOFOL N/A 01/09/2021   Procedure: ESOPHAGOGASTRODUODENOSCOPY (EGD) WITH PROPOFOL;  Surgeon: Irene Shipper, MD;  Location: [facility] ENDOSCOPY;  Service: Endoscopy;  Laterality: N/A;  . HEMOSTASIS CLIP PLACEMENT  12/22/2019   Procedure: HEMOSTASIS CLIP PLACEMENT;  Surgeon: Lavena Bullion, DO;  Location: [facility] ENDOSCOPY;  Service: Gastroenterology;;  . HEMOSTASIS CLIP PLACEMENT  12/22/2020   Procedure: HEMOSTASIS CLIP PLACEMENT;  Surgeon: Gatha Mayer, MD;  Location: [facility] ENDOSCOPY;  Service: Endoscopy;;  . HEMOSTASIS CONTROL  12/22/2020   Procedure: HEMOSTASIS CONTROL/hemospray;  Surgeon: Gatha Mayer, MD;  Location: [facility];  Service: Endoscopy;;  . HOT HEMOSTASIS N/A 02/08/2019   Procedure: HOT HEMOSTASIS (ARGON PLASMA COAGULATION/BICAP);  Surgeon: Milus Banister, MD;  Location: [facility] ENDOSCOPY;  Service: Gastroenterology;  Laterality: N/A;  . HOT HEMOSTASIS N/A 12/22/2019   Procedure: HOT HEMOSTASIS (ARGON PLASMA COAGULATION/BICAP);  Surgeon: Lavena Bullion, DO;   Location: [facility] ENDOSCOPY;  Service: Gastroenterology;  Laterality: N/A;  . HOT HEMOSTASIS N/A 10/19/2020   Procedure: HOT HEMOSTASIS (ARGON PLASMA COAGULATION/BICAP);  Surgeon: Jackquline Denmark, MD;  Location: [facility] ENDOSCOPY;  Service: Endoscopy;  Laterality: N/A;  . HOT HEMOSTASIS N/A 12/22/2020   Procedure: HOT HEMOSTASIS (ARGON PLASMA COAGULATION/BICAP);  Surgeon: Gatha Mayer, MD;  Location: [facility] ENDOSCOPY;  Service: Endoscopy;  Laterality: N/A;  . HOT HEMOSTASIS N/A 01/09/2021   Procedure: HOT HEMOSTASIS (ARGON PLASMA COAGULATION/BICAP);  Surgeon: Irene Shipper, MD;  Location: [facility] ENDOSCOPY;  Service: Endoscopy;  Laterality: N/A;  . INTERTROCHANTERIC HIP FRACTURE SURGERY Left 11/2006   [name] 03/08/2011  . INTRAVASCULAR ULTRASOUND/IVUS N/A 12/19/2019   Procedure: Intravascular Ultrasound/IVUS;  Surgeon: Jettie Booze, MD;  Location: [facility] CV LAB;  Service: Cardiovascular;  Laterality: N/A;  . IR FLUORO GUIDE CV LINE RIGHT  07/24/2019  . IR FLUORO GUIDE CV LINE RIGHT  07/30/2019  .  IR US GUIDE VASC ACCESS RIGHT  07/24/2019  . IR US GUIDE VASC ACCESS RIGHT  07/30/2019  . LAPAROSCOPIC CHOLECYSTECTOMY  05/2006  . LIGATION OF COMPETING BRANCHES OF ARTERIOVENOUS FISTULA Left 11/05/2018   Procedure: LIGATION OF COMPETING BRANCHES OF ARTERIOVENOUS FISTULA  LEFT  ARM;  Surgeon: Marty Heck, MD;  Location: [facility];  Service: Vascular;  Laterality: Left;  . LUMBAR LAMINECTOMY/DECOMPRESSION MICRODISCECTOMY N/A 02/29/2016   Procedure: Left L4-5 Lateral Recess Decompression, Removal Extradural Intraspinal Facet Cyst;  Surgeon: Marybelle Killings, MD;  Location: [facility];  Service: Orthopedics;  Laterality: N/A;  . MULTIPLE TOOTH EXTRACTIONS    . ORIF MANDIBULAR FRACTURE Left 08/13/2004   ORIF of left body fracture mandible with KLS Martin 2.3-mm six hole/notes 03/08/2011  . RIGHT/LEFT HEART CATH AND CORONARY/GRAFT ANGIOGRAPHY N/A 12/19/2019   Procedure: RIGHT/LEFT HEART CATH AND CORONARY/GRAFT ANGIOGRAPHY;   Surgeon: Jettie Booze, MD;  Location: [facility] CV LAB;  Service: Cardiovascular;  Laterality: N/A;  . SCLEROTHERAPY  12/22/2019   Procedure: SCLEROTHERAPY;  Surgeon: Lavena Bullion, DO;  Location: [facility] ENDOSCOPY;  Service: Gastroenterology;;       Family History  Problem Relation Age of Onset  . Heart failure Father   . Hypertension Father   . Diabetes Brother   . Heart attack Brother   . Alzheimer's disease Mother   . Stroke Sister   . Diabetes Sister   . Alzheimer's disease Sister   . Hypertension Brother   . Diabetes Brother   . Drug abuse Brother   . Colon cancer Neg Hx     Social History   Tobacco Use  . Smoking status: Current Every Day Smoker    Packs/day: 0.50    Years: 43.00    Pack years: 21.50    Types: Cigarettes  . Smokeless tobacco: Never Used  Vaping Use  . Vaping Use: Never used  Substance Use Topics  . Alcohol use: Yes    Alcohol/week: 12.0 standard drinks    Types: 12 Standard drinks or equivalent per week    Comment: occassional  . Drug use: Not Currently    Types: Cocaine    Comment: hx of crack/cocaine 82yrs ago 10/01/2019- none    Home Medications Prior to Admission medications   Medication Sig Start Date End Date Taking? Authorizing Provider  acetaminophen (TYLENOL) 650 MG CR tablet Take 650-1,300 mg by mouth 3 (three) times daily.    [provider]  allopurinol (ZYLOPRIM) 100 MG tablet TAKE 1 TABLET(100 MG) BY MOUTH DAILY 01/12/21   [name], Dinah C, NP  ANORO ELLIPTA 62.5-25 MCG/INH AEPB INHALE 1 PUFF BY MOUTH EVERY DAY 12/21/20   [name], NP  B-D UF III MINI PEN NEEDLES 31G X 5 MM MISC USE FOUR TIMES DAILY 03/02/20   [name], NP  bictegravir-emtricitabine-tenofovir AF (BIKTARVY) 50-200-25 MG TABS tablet Take 1 tablet by mouth daily. 06/30/20   [name], MD  carvedilol (COREG) 3.125 MG tablet Take 1 tablet (3.125 mg total) by mouth 2 (two) times daily. 02/13/20   [name], MD  Cinacalcet HCl  (SENSIPAR PO) Take 1 tablet by mouth 3 (three) times a week. At dialysis 08/14/20 08/13/21  [provider]  colchicine 0.6 MG tablet Take 0.6-1.2 mg by mouth daily as needed (gout flare-up).    [provider]  diclofenac Sodium (VOLTAREN) 1 % GEL APPLY 2 GRAMS TO THE AFFECTED AREA THREE TIMES DAILY AS NEEDED FOR PAIN 01/12/21   [name], Nelda Bucks,  NP  folic acid (FOLVITE) 1 MG tablet Take 1 tablet (1 mg total) by mouth daily. 01/12/21   [name], Dinah C, NP  gabapentin (NEURONTIN) 300 MG capsule Take 1 capsule (300 mg total) by mouth daily. Dose change due to renal function 12/09/20   [name], NP  insulin lispro (HUMALOG KWIKPEN) 100 UNIT/ML KwikPen Inject 0.39ml (20 Units total) into the skin 3 (three) times daily as needed for high blood sugar (above 150) Patient taking differently: Inject 0.8ml (20 Units total) into the skin 3 (three) times daily as needed for high blood sugar (above 150) 03/12/20   [name], NP  iron sucrose in sodium chloride 0.9 % 100 mL Iron Sucrose (Venofer) 07/08/20 07/07/21  [provider]  isosorbide mononitrate (IMDUR) 30 MG 24 hr tablet TAKE 1 TABLET BY MOUTH EVERY DAY 01/12/21   [name], Dinah C, NP  Lancets (ONETOUCH DELICA PLUS ZOXWRU04V) MISC Inject 1 Device as directed 3 (three) times daily. Dx: E11.40 09/18/19   [name], NP  lanthanum (FOSRENOL) 1000 MG chewable tablet Chew 1 tablet by mouth in the morning, at noon, and at bedtime. With meals. 01/11/21   [provider]  latanoprost (XALATAN) 0.005 % ophthalmic solution Place 1 drop into both eyes at bedtime.    [provider]  Methoxy PEG-Epoetin Beta (MIRCERA IJ) Mircera 12/25/20   [provider]  multivitamin (RENA-VIT) TABS tablet Take 1 tablet by mouth daily.    [provider]  nitroGLYCERIN (NITROSTAT) 0.3 MG SL tablet ONE TABLET UNDER TONGUE AS NEEDED FOR CHEST PAIN 05/01/19   [name], MD  [facility] ULTRA test strip  CHECK BLOOD SUGAR 3 TIMES  DAILY 05/27/20   [name], NP  pantoprazole (PROTONIX) 40 MG tablet TAKE 1 TABLET(40 MG) BY MOUTH TWICE DAILY 01/12/21   [name], Dinah C, NP  polyethylene glycol (MIRALAX / GLYCOLAX) 17 g packet Take 17 g by mouth daily as needed for mild constipation. 11/07/20   Aline August, MD  timolol (TIMOPTIC) 0.5 % ophthalmic solution Place 1 drop into both eyes daily. 04/04/19   [provider]  traMADol (ULTRAM) 50 MG tablet Take by mouth every 12 (twelve) hours as needed.    [provider]    Allergies    Augmentin [amoxicillin-pot clavulanate], Mucinex fast-max, and Amphetamines  Review of Systems   Review of Systems  All other systems reviewed and are negative.   Physical Exam Updated Vital Signs BP 125/67   Pulse 82   Temp 97.8 F (36.6 C) (Oral)   Resp 15   SpO2 99%   Physical Exam Vitals and nursing note reviewed.  Constitutional:      General: He is not in acute distress.    Appearance: He is well-developed. He is ill-appearing.  HENT:     Head: Normocephalic and atraumatic.  Eyes:     Conjunctiva/sclera: Conjunctivae normal.     Pupils: Pupils are equal, round, and reactive to light.  Cardiovascular:     Rate and Rhythm: Normal rate and regular rhythm.     Pulses: Normal pulses.     Heart sounds: Murmur heard.    Pulmonary:     Effort: Pulmonary effort is normal. No respiratory distress.     Breath sounds: Normal breath sounds. No wheezing or rales.  Abdominal:     General: There is no distension.     Palpations: Abdomen is soft.     Tenderness: There is no abdominal tenderness. There  is no guarding or rebound.  Genitourinary:    Rectum: Guaiac result positive.     Comments: Black stool Musculoskeletal:        General: No tenderness. Normal range of motion.     Cervical back: Normal range of motion and neck supple.     Right lower leg: No edema.     Left lower leg: No edema.  Skin:    General: Skin is warm  and dry.     Findings: No erythema or rash.  Neurological:     Mental Status: He is alert and oriented to person, place, and time. Mental status is at baseline.  Psychiatric:        Mood and Affect: Mood normal.        Behavior: Behavior normal.        Thought Content: Thought content normal.     ED Results / Procedures / Treatments   Labs (all labs ordered are listed, but only abnormal results are displayed) Labs Reviewed  CBC WITH DIFFERENTIAL/PLATELET - Abnormal; Notable for the following components:      Result Value   RBC 2.40 (*)    Hemoglobin 6.7 (*)    HCT 22.1 (*)    All other components within normal limits  BASIC METABOLIC PANEL - Abnormal; Notable for the following components:   Chloride 95 (*)    Glucose, Bld 156 (*)    BUN 75 (*)    Creatinine, Ser 8.39 (*)    GFR, Estimated 6 (*)    Anion gap 16 (*)    All other components within normal limits  POC OCCULT BLOOD, ED - Abnormal; Notable for the following components:   Fecal Occult Bld POSITIVE (*)    All other components within normal limits  TYPE AND SCREEN  PREPARE RBC (CROSSMATCH)    EKG None  Radiology No results found.  Procedures Procedures   Medications Ordered in ED Medications - No data to display  ED Course  I have reviewed the triage vital signs and the nursing notes.  Pertinent labs & imaging results that were available during my care of the patient were reviewed by me and considered in my medical decision making (see chart for details).    MDM Rules/Calculators/A&P                          Patient presenting today with concern for recurrent GI bleeding.  Black stool for the last 12 hours and feeling more weak.  This has been an ongoing issue with the patient as he was having to take Brilinta due to complicated CAD but has been on aspirin since his last hospitalization.  He has required cauterization of AVM and clipping of pulsatile bleeding in the duodenum.  Patient continues to take  his Protonix denying any abdominal pain.  He is currently hemodynamically stable.  On exam his stool is black but no gross blood present.  Will give patient Protonix.  Hemoccult is positive.  Patient reports that his hemoglobin tends to be between 8 and 9 and when it was checked on Friday was already down to 7.  When he spoke with his doctor today they recommended coming to the emergency room as he was starting to feel worse.  11:40 AM Hemoglobin today is 6.7.  Patient will be transfused 2 units.  Last hemoglobin last month was 9 when leaving the hospital.  Patient also given IV Protonix.  Will discuss with [place]  [facility] and pt will need admission.  CMP at baseline today except for BUN of 75.  MDM Number of Diagnoses or Management Options   Amount and/or Complexity of Data Reviewed Clinical lab tests: ordered and reviewed Independent visualization of images, tracings, or specimens: yes  Risk of Complications, Morbidity, and/or Mortality Presenting problems: moderate Diagnostic procedures: moderate Management options: moderate  Patient Progress Patient progress: stable   CRITICAL CARE Performed by: Rafi Kenneth Total critical care time: 30 minutes Critical care time was exclusive of separately billable procedures and treating other patients. Critical care was necessary to treat or prevent imminent or life-threatening deterioration. Critical care was time spent personally by me on the following activities: development of treatment plan with patient and/or surrogate as well as nursing, discussions with consultants, evaluation of patient's response to treatment, examination of patient, obtaining history from patient or surrogate, ordering and performing treatments and interventions, ordering and review of laboratory studies, ordering and review of radiographic studies, pulse oximetry and re-evaluation of patient's condition.    Final Clinical Impression(s) / ED Diagnoses Final diagnoses:   Acute GI bleeding  Symptomatic anemia    Rx / DC Orders ED Discharge Orders    None       Blanchie Dessert, MD 01/24/21 1142

## 2021-01-24 NOTE — Consult Note (Addendum)
Referring Provider:  EDP, Dr. Maryan Rued Primary Care Physician:  [name], NP Primary Gastroenterologist:  Dr. Henrene Pastor  Reason for Consultation:  Anemia, GI bleed  HPI: Jacob [name] is a 70 y.o. male with PMH of Diastolic, systolic heart failure.  CABG 2004.  CAD.  Drug-eluting stent placed February 2021.  End-stage renal disease with hemodialysis MWF.  HIV.  COPD.  Patient with recurrent issues of heme positive stools, hematochezia, melena, transfusion requiring anemia.  Says that his blood counts were low at dialysis on Friday.  Last night he started having black stools and again this AM.  Hgb here was 6.7 grams down from 9 grams just 12 days ago.  Receiving one unit PRBCs.  He is not currently on his Brilinta, only ASA 81 mg since [facility].  01/09/2021 EGD for GI bleed 1. Bleeding gastric AVMs status post successful APC therapy 2. Otherwise unremarkable upper endoscopy.  12/22/2020 EGD for GI bleed - Blood in the duodenal bulb. - Nodule found in the duodenum. steady non-pulsatile bleeding seen at base, very close to distal aspect pylorus so positioning difficult - ? AVM Treated with argon plasma coagulation (APC) - partially successful Clips (MR conditional) were placed. hemostatic spray applied. No bleeding at end - The examination was otherwise normal.  10/19/20 EGD for GI bleed Gastric AVMs s/p APC treatment. - No specimens collected  12/22/19 EGD --bleeding gastric AVMs  April 2020 EGD --Gastric and duodenal bulb AVMs  Dec 2013 screening colonoscopy --moderate diverticulosis --2 mm polyp ( removed but not retrieved)    Past Medical History:  Diagnosis Date  . Acute respiratory failure ([place]) 03/01/2018  . Arthritis    "all over; mostly knees and back" (02/28/2018)  . Chronic lower back pain    stenosis  . Community acquired pneumonia 09/06/2013  . COPD (chronic obstructive pulmonary disease) ([place])   . Coronary  atherosclerosis of native coronary artery    a. 02/2003 s/p CABG x 2 (VG->RI, VG->RPDA; b. 11/2019 PCI: LM nl, LAD 90d, D3 50, RI 100, LCX 100p, OM3 100 - fills via L->L collats from D2/dLAD, RCA 100p, VG->RPDA ok, VG->RI 95 (3.5x48 Synergy XD DES).  . Drug abuse ([place])    hx; tested for cocaine as recently as 2/08. says he is not using drugs now - avoided defib. for this reason   . ESRD (end stage renal disease) ([place])    Hemo M-W-F- Richarda Blade  . Fall at home 10/2020  . GERD (gastroesophageal reflux disease)    takes OTC meds as needed  . GI bleeding    a. 11/2019 EGD: angiodysplastic lesions w/ bleeding s/p argon plasma/clipping/epi inj.  . Glaucoma    uses eye drops daily  . Hepatitis B 1968   "tx'd w/isolation; caught it from toilet stools in gym"  . HFrEF (heart failure with reduced ejection fraction) ([place])    a. 01/2019 Echo: EF 40-45%, diffuse HK, mild basal septal hypertrophy.  . History of blood transfusion 03/01/2019  . History of colon polyps    benign  . History of gout    takes Allopurinol daily as well as Colchicine-if needed (02/28/2018)  . History of kidney stones   . HTN (hypertension)    takes Coreg,Imdur.and Apresoline daily  . Human immunodeficiency virus (HIV) disease ([place]) dx'd 48   on [place] as of 12/2020.    [name] Hyperlipidemia    takes Atorvastatin daily  . Ischemic cardiomyopathy  a. 01/2019 Echo: EF 40-45%, diffuse HK, mild basal septal hypertrophy. Diast dysfxn. Nl RV size/fxn. Sev dil LA. Triv MR/TR/PR.  [name] Muscle spasm    takes Zanaflex as needed  . Myocardial infarction ([place]) ~ 2004/2005  . Nocturia   . Peripheral neuropathy    takes gabapentin daily  . Pneumonia    "at least twice" (02/28/2018)  . Syphilis, unspecified   . Type II diabetes mellitus ([place]) 2004   Lantus daily.Average fasting blood sugar 125-199  . Wears glasses   . Wears partial dentures     Past Surgical History:  Procedure Laterality Date  . AV FISTULA PLACEMENT Left 08/02/2018    Procedure: ARTERIOVENOUS (AV) FISTULA CREATION  left arm radiocephlic;  Surgeon: Marty Heck, MD;  Location: [place];  Service: Vascular;  Laterality: Left;  . AV FISTULA PLACEMENT Left 08/01/2019   Procedure: LEFT BRACHIOCEPHALIC ARTERIOVENOUS (AV) FISTULA CREATION;  Surgeon: [name], MD;  Location: [place];  Service: Vascular;  Laterality: Left;  . BASCILIC VEIN TRANSPOSITION Left 10/03/2019   Procedure: BASILIC VEIN TRANSPOSITION LEFT SECOND STAGE;  Surgeon: [name], MD;  Location: [place];  Service: Vascular;  Laterality: Left;  . CARDIAC CATHETERIZATION  10/2002; 12/19/2004   [name] 03/08/2011  . COLONOSCOPY  2013   Dr.John Henrene Pastor   . CORONARY ARTERY BYPASS GRAFT  02/24/2003   CABG X2/notes 03/08/2011  . CORONARY STENT INTERVENTION N/A 12/19/2019   Procedure: CORONARY STENT INTERVENTION;  Surgeon: Jettie Booze, MD;  Location: [place] [facility];  Service: Cardiovascular;  Laterality: N/A;  . ESOPHAGOGASTRODUODENOSCOPY (EGD) WITH PROPOFOL N/A 02/08/2019   Procedure: ESOPHAGOGASTRODUODENOSCOPY (EGD) WITH PROPOFOL;  Surgeon: Milus Banister, MD;  Location: [place];  Service: Gastroenterology;  Laterality: N/A;  . ESOPHAGOGASTRODUODENOSCOPY (EGD) WITH PROPOFOL N/A 12/22/2019   Procedure: ESOPHAGOGASTRODUODENOSCOPY (EGD) WITH PROPOFOL;  Surgeon: Lavena Bullion, DO;  Location: [place];  Service: Gastroenterology;  Laterality: N/A;  . ESOPHAGOGASTRODUODENOSCOPY (EGD) WITH PROPOFOL N/A 10/19/2020   Procedure: ESOPHAGOGASTRODUODENOSCOPY (EGD) WITH PROPOFOL;  Surgeon: Jackquline Denmark, MD;  Location: [facility] ENDOSCOPY;  Service: Endoscopy;  Laterality: N/A;  . ESOPHAGOGASTRODUODENOSCOPY (EGD) WITH PROPOFOL N/A 12/22/2020   Procedure: ESOPHAGOGASTRODUODENOSCOPY (EGD) WITH PROPOFOL;  Surgeon: Gatha Mayer, MD;  Location: [place];  Service: Endoscopy;  Laterality: N/A;  . ESOPHAGOGASTRODUODENOSCOPY (EGD) WITH PROPOFOL N/A 01/09/2021   Procedure: ESOPHAGOGASTRODUODENOSCOPY  (EGD) WITH PROPOFOL;  Surgeon: Irene Shipper, MD;  Location: [facility] ENDOSCOPY;  Service: Endoscopy;  Laterality: N/A;  . HEMOSTASIS CLIP PLACEMENT  12/22/2019   Procedure: HEMOSTASIS CLIP PLACEMENT;  Surgeon: Lavena Bullion, DO;  Location: [place] ENDOSCOPY;  Service: Gastroenterology;;  . HEMOSTASIS CLIP PLACEMENT  12/22/2020   Procedure: HEMOSTASIS CLIP PLACEMENT;  Surgeon: Gatha Mayer, MD;  Location: [facility] ENDOSCOPY;  Service: Endoscopy;;  . HEMOSTASIS CONTROL  12/22/2020   Procedure: HEMOSTASIS CONTROL/hemospray;  Surgeon: Gatha Mayer, MD;  Location: [place];  Service: Endoscopy;;  . HOT HEMOSTASIS N/A 02/08/2019   Procedure: HOT HEMOSTASIS (ARGON PLASMA COAGULATION/BICAP);  Surgeon: Milus Banister, MD;  Location: [facility] ENDOSCOPY;  Service: Gastroenterology;  Laterality: N/A;  . HOT HEMOSTASIS N/A 12/22/2019   Procedure: HOT HEMOSTASIS (ARGON PLASMA COAGULATION/BICAP);  Surgeon: Lavena Bullion, DO;  Location: [facility] ENDOSCOPY;  Service: Gastroenterology;  Laterality: N/A;  . HOT HEMOSTASIS N/A 10/19/2020   Procedure: HOT HEMOSTASIS (ARGON PLASMA COAGULATION/BICAP);  Surgeon: Jackquline Denmark, MD;  Location: [facility] ENDOSCOPY;  Service: Endoscopy;  Laterality: N/A;  . HOT HEMOSTASIS N/A 12/22/2020   Procedure: HOT HEMOSTASIS (  ARGON PLASMA COAGULATION/BICAP);  Surgeon: Gatha Mayer, MD;  Location: [facility] ENDOSCOPY;  Service: Endoscopy;  Laterality: N/A;  . HOT HEMOSTASIS N/A 01/09/2021   Procedure: HOT HEMOSTASIS (ARGON PLASMA COAGULATION/BICAP);  Surgeon: Irene Shipper, MD;  Location: [facility] ENDOSCOPY;  Service: Endoscopy;  Laterality: N/A;  . INTERTROCHANTERIC HIP FRACTURE SURGERY Left 11/2006   [name] 03/08/2011  . INTRAVASCULAR ULTRASOUND/IVUS N/A 12/19/2019   Procedure: Intravascular Ultrasound/IVUS;  Surgeon: Jettie Booze, MD;  Location: [place] CV LAB;  Service: Cardiovascular;  Laterality: N/A;  . IR FLUORO GUIDE CV LINE RIGHT  07/24/2019  . IR FLUORO GUIDE CV LINE RIGHT  07/30/2019  . IR US  GUIDE VASC ACCESS RIGHT  07/24/2019  . IR US GUIDE VASC ACCESS RIGHT  07/30/2019  . LAPAROSCOPIC CHOLECYSTECTOMY  05/2006  . LIGATION OF COMPETING BRANCHES OF ARTERIOVENOUS FISTULA Left 11/05/2018   Procedure: LIGATION OF COMPETING BRANCHES OF ARTERIOVENOUS FISTULA  LEFT  ARM;  Surgeon: Marty Heck, MD;  Location: [place];  Service: Vascular;  Laterality: Left;  . LUMBAR LAMINECTOMY/DECOMPRESSION MICRODISCECTOMY N/A 02/29/2016   Procedure: Left L4-5 Lateral Recess Decompression, Removal Extradural Intraspinal Facet Cyst;  Surgeon: Marybelle Killings, MD;  Location: [place];  Service: Orthopedics;  Laterality: N/A;  . MULTIPLE TOOTH EXTRACTIONS    . ORIF MANDIBULAR FRACTURE Left 08/13/2004   ORIF of left body fracture mandible with KLS Martin 2.3-mm six hole/notes 03/08/2011  . RIGHT/LEFT HEART CATH AND CORONARY/GRAFT ANGIOGRAPHY N/A 12/19/2019   Procedure: RIGHT/LEFT HEART CATH AND CORONARY/GRAFT ANGIOGRAPHY;  Surgeon: Jettie Booze, MD;  Location: [place] CV LAB;  Service: Cardiovascular;  Laterality: N/A;  . SCLEROTHERAPY  12/22/2019   Procedure: SCLEROTHERAPY;  Surgeon: Lavena Bullion, DO;  Location: [place] ENDOSCOPY;  Service: Gastroenterology;;    Prior to Admission medications   Medication Sig Start Date End Date Taking? Authorizing Provider  allopurinol (ZYLOPRIM) 100 MG tablet TAKE 1 TABLET(100 MG) BY MOUTH DAILY Patient taking differently: Take 100 mg by mouth daily. 01/12/21  Yes [name], Dinah C, NP  bictegravir-emtricitabine-tenofovir AF (BIKTARVY) 50-200-25 MG TABS tablet Take 1 tablet by mouth daily. 06/30/20  Yes [name], MD  carvedilol (COREG) 3.125 MG tablet Take 1 tablet (3.125 mg total) by mouth 2 (two) times daily. 02/13/20  Yes [name], MD  folic acid (FOLVITE) 1 MG tablet Take 1 tablet (1 mg total) by mouth daily. 01/12/21  Yes [name], Dinah C, NP  gabapentin (NEURONTIN) 300 MG capsule Take 1 capsule (300 mg total) by mouth daily. Dose change due to renal  function 12/09/20  Yes Eubanks, [name], NP  insulin lispro (HUMALOG KWIKPEN) 100 UNIT/ML KwikPen Inject 0.38ml (20 Units total) into the skin 3 (three) times daily as needed for high blood sugar (above 150) Patient taking differently: Inject 0.27ml (20 Units total) into the skin 3 (three) times daily as needed for high blood sugar (above 150) 03/12/20  Yes [name], NP  isosorbide mononitrate (IMDUR) 30 MG 24 hr tablet TAKE 1 TABLET BY MOUTH EVERY DAY 01/12/21  Yes [name], Dinah C, NP  acetaminophen (TYLENOL) 650 MG CR tablet Take 650-1,300 mg by mouth 3 (three) times daily.    [provider]  Celedonio Miyamoto 62.5-25 MCG/INH AEPB INHALE 1 PUFF BY MOUTH EVERY DAY 12/21/20   [name], NP  B-D UF III MINI PEN NEEDLES 31G X 5 MM MISC USE FOUR TIMES DAILY 03/02/20   [name], NP  Calcium Acetate 667 MG TABS Take by  mouth. 01/20/21   [provider]  Cinacalcet HCl (SENSIPAR PO) Take 1 tablet by mouth 3 (three) times a week. At dialysis 08/14/20 08/13/21  [provider]  colchicine 0.6 MG tablet Take 0.6-1.2 mg by mouth daily as needed (gout flare-up).    [provider]  diclofenac Sodium (VOLTAREN) 1 % GEL APPLY 2 GRAMS TO THE AFFECTED AREA THREE TIMES DAILY AS NEEDED FOR PAIN 01/12/21   [name], Dinah C, NP  iron sucrose in sodium chloride 0.9 % 100 mL Iron Sucrose (Venofer) 07/08/20 07/07/21  [provider]  Lancets (ONETOUCH DELICA PLUS [id number]) MISC Inject 1 Device as directed 3 (three) times daily. Dx: E11.40 09/18/19   [name], NP  latanoprost (XALATAN) 0.005 % ophthalmic solution Place 1 drop into both eyes at bedtime.    [provider]  Methoxy PEG-Epoetin Beta (MIRCERA IJ) Mircera 12/25/20   [provider]  multivitamin (RENA-VIT) TABS tablet Take 1 tablet by mouth daily.    [provider]  nitroGLYCERIN (NITROSTAT) 0.3 MG SL tablet ONE TABLET UNDER TONGUE AS NEEDED FOR CHEST PAIN 05/01/19    [name], MD  [facility] ULTRA test strip CHECK BLOOD SUGAR 3 TIMES  DAILY 05/27/20   [name], NP  pantoprazole (PROTONIX) 40 MG tablet TAKE 1 TABLET(40 MG) BY MOUTH TWICE DAILY 01/12/21   [name], Dinah C, NP  polyethylene glycol (MIRALAX / GLYCOLAX) 17 g packet Take 17 g by mouth daily as needed for mild constipation. 11/07/20   Aline August, MD  timolol (TIMOPTIC) 0.5 % ophthalmic solution Place 1 drop into both eyes daily. 04/04/19   [provider]  traMADol (ULTRAM) 50 MG tablet Take by mouth every 12 (twelve) hours as needed.    [provider]    Current Facility-Administered Medications  Medication Dose Route Frequency Provider Last Rate Last Admin  . ondansetron (ZOFRAN) tablet 4 mg  4 mg Oral Q6H PRN Fuller Plan A, MD       Or  . ondansetron (ZOFRAN) injection 4 mg  4 mg Intravenous Q6H PRN Smith, Rondell A, MD      . sodium chloride flush (NS) 0.9 % injection 3 mL  3 mL Intravenous Q12H Norval Morton, MD       Current Outpatient Medications  Medication Sig Dispense Refill  . allopurinol (ZYLOPRIM) 100 MG tablet TAKE 1 TABLET(100 MG) BY MOUTH DAILY (Patient taking differently: Take 100 mg by mouth daily.) 90 tablet 1  . bictegravir-emtricitabine-tenofovir AF (BIKTARVY) 50-200-25 MG TABS tablet Take 1 tablet by mouth daily. 30 tablet 11  . carvedilol (COREG) 3.125 MG tablet Take 1 tablet (3.125 mg total) by mouth 2 (two) times daily. 275 tablet 3  . folic acid (FOLVITE) 1 MG tablet Take 1 tablet (1 mg total) by mouth daily. 90 tablet 1  . gabapentin (NEURONTIN) 300 MG capsule Take 1 capsule (300 mg total) by mouth daily. Dose change due to renal function 90 capsule 1  . insulin lispro (HUMALOG KWIKPEN) 100 UNIT/ML KwikPen Inject 0.45ml (20 Units total) into the skin 3 (three) times daily as needed for high blood sugar (above 150) (Patient taking differently: Inject 0.66ml (20 Units total) into the skin 3 (three) times daily as needed for high blood  sugar (above 150)) 15 pen 1  . isosorbide mononitrate (IMDUR) 30 MG 24 hr tablet TAKE 1 TABLET BY MOUTH EVERY DAY 90 tablet 1  . acetaminophen (TYLENOL) 650 MG CR tablet Take 650-1,300 mg by mouth  3 (three) times daily.    Jearl Klinefelter ELLIPTA 62.5-25 MCG/INH AEPB INHALE 1 PUFF BY MOUTH EVERY DAY 60 each 5  . B-D UF III MINI PEN NEEDLES 31G X 5 MM MISC USE FOUR TIMES DAILY 100 each 11  . Calcium Acetate 667 MG TABS Take by mouth.    . Cinacalcet HCl (SENSIPAR PO) Take 1 tablet by mouth 3 (three) times a week. At dialysis    . colchicine 0.6 MG tablet Take 0.6-1.2 mg by mouth daily as needed (gout flare-up).    . diclofenac Sodium (VOLTAREN) 1 % GEL APPLY 2 GRAMS TO THE AFFECTED AREA THREE TIMES DAILY AS NEEDED FOR PAIN 600 g 0  . iron sucrose in sodium chloride 0.9 % 100 mL Iron Sucrose (Venofer)    . Lancets (ONETOUCH DELICA PLUS [id number]) MISC Inject 1 Device as directed 3 (three) times daily. Dx: E11.40 300 each 3  . latanoprost (XALATAN) 0.005 % ophthalmic solution Place 1 drop into both eyes at bedtime.    . Methoxy PEG-Epoetin Beta (MIRCERA IJ) Mircera    . multivitamin (RENA-VIT) TABS tablet Take 1 tablet by mouth daily.    . nitroGLYCERIN (NITROSTAT) 0.3 MG SL tablet ONE TABLET UNDER TONGUE AS NEEDED FOR CHEST PAIN 25 tablet 4  . ONETOUCH ULTRA test strip CHECK BLOOD SUGAR 3 TIMES  DAILY 300 strip 3  . pantoprazole (PROTONIX) 40 MG tablet TAKE 1 TABLET(40 MG) BY MOUTH TWICE DAILY 180 tablet 1  . polyethylene glycol (MIRALAX / GLYCOLAX) 17 g packet Take 17 g by mouth daily as needed for mild constipation. 14 each 0  . timolol (TIMOPTIC) 0.5 % ophthalmic solution Place 1 drop into both eyes daily.    . traMADol (ULTRAM) 50 MG tablet Take by mouth every 12 (twelve) hours as needed.      Allergies as of 01/24/2021 - Review Complete 01/24/2021  Allergen Reaction Noted  . Augmentin [amoxicillin-pot clavulanate] Diarrhea 12/05/2018  . Mucinex fast-max Other (See Comments) 11/30/2020  .  Amphetamines Other (See Comments) 01/15/2012    Family History  Problem Relation Age of Onset  . Heart failure Father   . Hypertension Father   . Diabetes Brother   . Heart attack Brother   . Alzheimer's disease Mother   . Stroke Sister   . Diabetes Sister   . Alzheimer's disease Sister   . Hypertension Brother   . Diabetes Brother   . Drug abuse Brother   . Colon cancer Neg Hx     Social History   Socioeconomic History  . Marital status: Single    Spouse name: Not on file  . Number of children: 1  . Years of education: Not on file  . Highest education level: Not on file  Occupational History  . Occupation: retired  Tobacco Use  . Smoking status: Current Every Day Smoker    Packs/day: 0.50    Years: 43.00    Pack years: 21.50    Types: Cigarettes  . Smokeless tobacco: Never Used  Vaping Use  . Vaping Use: Never used  Substance and Sexual Activity  . Alcohol use: Yes    Alcohol/week: 12.0 standard drinks    Types: 12 Standard drinks or equivalent per week    Comment: occassional  . Drug use: Not Currently    Types: Cocaine    Comment: hx of crack/cocaine 18yrs ago 10/01/2019- none  . Sexual activity: Yes    Partners: Male  Other Topics Concern  . Not on file  Social History Narrative   Retired.       As of 05/2015:   Diet: No salt    Caffeine   Married: Single    House: Condo, 2 stories, 1 person (self)    Pets: No    Current/Past profession: N/A   Exercise: walks daily    Living Will: Yes    DNR   POA/HPOA: No       Social Determinants of Health   Financial Resource Strain: Low Risk   . Difficulty of Paying Living Expenses: Not hard at all  Food Insecurity: No Food Insecurity  . Worried About Charity fundraiser in the Last Year: Never true  . Ran Out of Food in the Last Year: Never true  Transportation Needs: No Transportation Needs  . Lack of Transportation (Medical): No  . Lack of Transportation (Non-Medical): No  Physical Activity: Not on  file  Stress: Not on file  Social Connections: Not on file  Intimate Partner Violence: Not on file    Review of Systems: ROS is O/W negative except as mentioned in HPI.  Physical Exam: Vital signs in last 24 hours: Temp:  [97.8 F (36.6 C)] 97.8 F (36.6 C) (04/03 1009) Pulse Rate:  [82-85] 85 (04/03 1205) Resp:  [15-19] 18 (04/03 1205) BP: (125-127)/(60-67) 127/60 (04/03 1205) SpO2:  [99 %] 99 % (04/03 1205)   General:  Alert, Well-developed, well-nourished, pleasant and cooperative in NAD Head:  Normocephalic and atraumatic. Eyes:  Sclera clear, no icterus.  Conjunctiva pink. Ears:  Normal auditory acuity. Mouth:  No deformity or lesions.   Lungs:  Clear throughout to auscultation.  No wheezes, crackles, or rhonchi.  Heart:  Regular rate and rhythm; no murmurs, clicks, rubs, or gallops. Abdomen:  Soft, non-distended.  BS present.  Non-tender.   Msk:  Symmetrical without gross deformities. Pulses:  Normal pulses noted. Extremities:  Without clubbing or edema. Neurologic:  Alert and  oriented x4;  grossly normal neurologically. Skin:  Intact without significant lesions or rashes. Psych:  Alert and cooperative. Normal mood and affect.  Lab Results: Recent Labs    01/24/21 1013  WBC 4.9  HGB 6.7*  HCT 22.1*  PLT 178   BMET Recent Labs    01/24/21 1013  NA 137  K 4.9  CL 95*  CO2 26  GLUCOSE 156*  BUN 75*  CREATININE 8.39*  CALCIUM 8.9   IMPRESSION:  # Recurrent GI bleeding with black stools just since yesterday and acute on chronic anemia.  Bleeding probably related to gastric / intestinal AVMs.  ? Ulcers related to previous APC treatment. --Hgb 6.7 grams, down from 9.0 grams just 12 days ago.  Receiving one unit PRBCs.  # CAD / remote CABG / stent Feb 0160 / chronic systolic and diastolic heart failure:  Has not been on his Brilinta, only ASA 81 mg daily [facility].  # ESRD on HD Mon,W,Fr  # HIV on  therapy  PLAN: -Will plan for enteroscopy on 4/4 with intervention if needed. -Monitor Hgb and transfuse further prn. -Pantoprazole 40 mg BID for now. -Ok for clear liquids then NPO after midnight.  Laban Emperor. Zehr  01/24/2021, 12:53 PM    Attending physician's note   I have taken an interval history, reviewed the chart and examined the patient. I agree with the Advanced Practitioner's note, impression and recommendations.   Recurrent melena despite being off Brilinta. Hb 8.3 to 6.7. Multiple prev  EGDs showing Gastric AVMs s/p APC (most recently March 19). EGD 3/1 did show a bleeding duodenal nodule s/p APC/Clips/hemospray.    Multiple comorbid conditions including ESRD on HD (MWF), HIV, CAD s/p CABG with PTCA/DES Feb 2021, CHF, COPD, DM2, HTN.  Plan: -IV Protonix. -Agree with transfusion. Keep Hb>7. -Enteroscopy in AM with Dr Loletha Carrow. If neg VCE after prep. Not keen on getting colon. (last colon 09/2012- neg) -Can continue ASA    Carmell Austria, MD [facility] [phone]

## 2021-01-24 NOTE — Consult Note (Addendum)
[facility] Renal Consultation Note    I have seen and examined this patient and agree with the plan of care    Sherril Croon 01/25/2021, 3:05 PM  Indication for Consultation:  Management of ESRD/hemodialysis, anemia, hypertension/volume, and secondary hyperparathyroidism.  HPI: Jacob [name] is a 70 y.o. male with PMH including ESRD on dialysis, anemia with recurrent GI bleed (most recently admitted in 12/2020), COPD, CAD, HFrEF, T2DM and HTN who presented to the ED today with dark stools and weakness. Patient reported approximately 12 hours of dark colored stools. He also reported feeling slightly dizzy and short of breath, similarly to how he felt during past episodes of GI bleeding. His outpatient records show hemoglobin trending down recently, from 8.3 on 3/25 to 7/9 on 3/30. He does receive ESA with dialysis, last dose was mircera 200 mcg on 01/20/21. In the ED, Hgb was 6.7 and FOBT positive. PRBC were ordered and GI was consulted. Nephrology has been consulted for management of ESRD during his admission. His last dialysis was Friday, 01/22/21 and he completed the full treatment. Left 0.6kg below his EDW. VS stable, SpO2 100% on room air.   At present, patient reports feeling slightly weak and SOB. Denies cough, orthopnea, CP, palpitations, abdominal pain and nausea. No fevers/chills reported. Denies any recent issues with dialysis including AVF issues. He is a bit frustrated about the recurrent hospitalizations, especially because today is his [birth year].   Past Medical History:  Diagnosis Date  . Acute respiratory failure ([place]) 03/01/2018  . Arthritis    "all over; mostly knees and back" (02/28/2018)  . Chronic lower back pain    stenosis  . Community acquired pneumonia 09/06/2013  . COPD (chronic obstructive pulmonary disease) ([place])   . Coronary atherosclerosis of native coronary artery    a. 02/2003 s/p CABG x 2 (VG->RI, VG->RPDA; b. 11/2019 PCI: LM nl, LAD 90d, D3 50, RI 100,  LCX 100p, OM3 100 - fills via L->L collats from D2/dLAD, RCA 100p, VG->RPDA ok, VG->RI 95 (3.5x48 Synergy XD DES).  . Drug abuse ([place])    hx; tested for cocaine as recently as 2/08. says he is not using drugs now - avoided defib. for this reason   . ESRD (end stage renal disease) ([place])    Hemo M-W-F- Richarda Blade  . Fall at home 10/2020  . GERD (gastroesophageal reflux disease)    takes OTC meds as needed  . GI bleeding    a. 11/2019 EGD: angiodysplastic lesions w/ bleeding s/p argon plasma/clipping/epi inj.  . Glaucoma    uses eye drops daily  . Hepatitis B 1968   "tx'd w/isolation; caught it from toilet stools in gym"  . HFrEF (heart failure with reduced ejection fraction) ([place])    a. 01/2019 Echo: EF 40-45%, diffuse HK, mild basal septal hypertrophy.  . History of blood transfusion 03/01/2019  . History of colon polyps    benign  . History of gout    takes Allopurinol daily as well as Colchicine-if needed (02/28/2018)  . History of kidney stones   . HTN (hypertension)    takes Coreg,Imdur.and Apresoline daily  . Human immunodeficiency virus (HIV) disease ([place]) dx'd 13   on [place] as of 12/2020.    [name] Hyperlipidemia    takes Atorvastatin daily  . Ischemic cardiomyopathy    a. 01/2019 Echo: EF 40-45%, diffuse HK, mild basal septal hypertrophy. Diast dysfxn. Nl RV size/fxn. Sev dil LA. Triv MR/TR/PR.  [name] Muscle spasm    takes  Zanaflex as needed  . Myocardial infarction ([place]) ~ 2004/2005  . Nocturia   . Peripheral neuropathy    takes gabapentin daily  . Pneumonia    "at least twice" (02/28/2018)  . Syphilis, unspecified   . Type II diabetes mellitus ([place]) 2004   Lantus daily.Average fasting blood sugar 125-199  . Wears glasses   . Wears partial dentures    Past Surgical History:  Procedure Laterality Date  . AV FISTULA PLACEMENT Left 08/02/2018   Procedure: ARTERIOVENOUS (AV) FISTULA CREATION  left arm radiocephlic;  Surgeon: Marty Heck, MD;  Location: [place];  Service:  Vascular;  Laterality: Left;  . AV FISTULA PLACEMENT Left 08/01/2019   Procedure: LEFT BRACHIOCEPHALIC ARTERIOVENOUS (AV) FISTULA CREATION;  Surgeon: [name], MD;  Location: [place];  Service: Vascular;  Laterality: Left;  . BASCILIC VEIN TRANSPOSITION Left 10/03/2019   Procedure: BASILIC VEIN TRANSPOSITION LEFT SECOND STAGE;  Surgeon: [name], MD;  Location: [place];  Service: Vascular;  Laterality: Left;  . CARDIAC CATHETERIZATION  10/2002; 12/19/2004   [name] 03/08/2011  . COLONOSCOPY  2013   Dr.John Henrene Pastor   . CORONARY ARTERY BYPASS GRAFT  02/24/2003   CABG X2/notes 03/08/2011  . CORONARY STENT INTERVENTION N/A 12/19/2019   Procedure: CORONARY STENT INTERVENTION;  Surgeon: Jettie Booze, MD;  Location: [place] CV LAB;  Service: Cardiovascular;  Laterality: N/A;  . ESOPHAGOGASTRODUODENOSCOPY (EGD) WITH PROPOFOL N/A 02/08/2019   Procedure: ESOPHAGOGASTRODUODENOSCOPY (EGD) WITH PROPOFOL;  Surgeon: Milus Banister, MD;  Location: [place];  Service: Gastroenterology;  Laterality: N/A;  . ESOPHAGOGASTRODUODENOSCOPY (EGD) WITH PROPOFOL N/A 12/22/2019   Procedure: ESOPHAGOGASTRODUODENOSCOPY (EGD) WITH PROPOFOL;  Surgeon: Lavena Bullion, DO;  Location: [place];  Service: Gastroenterology;  Laterality: N/A;  . ESOPHAGOGASTRODUODENOSCOPY (EGD) WITH PROPOFOL N/A 10/19/2020   Procedure: ESOPHAGOGASTRODUODENOSCOPY (EGD) WITH PROPOFOL;  Surgeon: Jackquline Denmark, MD;  Location: [facility] ENDOSCOPY;  Service: Endoscopy;  Laterality: N/A;  . ESOPHAGOGASTRODUODENOSCOPY (EGD) WITH PROPOFOL N/A 12/22/2020   Procedure: ESOPHAGOGASTRODUODENOSCOPY (EGD) WITH PROPOFOL;  Surgeon: Gatha Mayer, MD;  Location: [place];  Service: Endoscopy;  Laterality: N/A;  . ESOPHAGOGASTRODUODENOSCOPY (EGD) WITH PROPOFOL N/A 01/09/2021   Procedure: ESOPHAGOGASTRODUODENOSCOPY (EGD) WITH PROPOFOL;  Surgeon: Irene Shipper, MD;  Location: [facility] ENDOSCOPY;  Service: Endoscopy;  Laterality: N/A;  . HEMOSTASIS CLIP  PLACEMENT  12/22/2019   Procedure: HEMOSTASIS CLIP PLACEMENT;  Surgeon: Lavena Bullion, DO;  Location: [place] ENDOSCOPY;  Service: Gastroenterology;;  . HEMOSTASIS CLIP PLACEMENT  12/22/2020   Procedure: HEMOSTASIS CLIP PLACEMENT;  Surgeon: Gatha Mayer, MD;  Location: [facility] ENDOSCOPY;  Service: Endoscopy;;  . HEMOSTASIS CONTROL  12/22/2020   Procedure: HEMOSTASIS CONTROL/hemospray;  Surgeon: Gatha Mayer, MD;  Location: [place];  Service: Endoscopy;;  . HOT HEMOSTASIS N/A 02/08/2019   Procedure: HOT HEMOSTASIS (ARGON PLASMA COAGULATION/BICAP);  Surgeon: Milus Banister, MD;  Location: [facility] ENDOSCOPY;  Service: Gastroenterology;  Laterality: N/A;  . HOT HEMOSTASIS N/A 12/22/2019   Procedure: HOT HEMOSTASIS (ARGON PLASMA COAGULATION/BICAP);  Surgeon: Lavena Bullion, DO;  Location: [facility] ENDOSCOPY;  Service: Gastroenterology;  Laterality: N/A;  . HOT HEMOSTASIS N/A 10/19/2020   Procedure: HOT HEMOSTASIS (ARGON PLASMA COAGULATION/BICAP);  Surgeon: Jackquline Denmark, MD;  Location: [facility] ENDOSCOPY;  Service: Endoscopy;  Laterality: N/A;  . HOT HEMOSTASIS N/A 12/22/2020   Procedure: HOT HEMOSTASIS (ARGON PLASMA COAGULATION/BICAP);  Surgeon: Gatha Mayer, MD;  Location: [facility] ENDOSCOPY;  Service: Endoscopy;  Laterality: N/A;  . HOT HEMOSTASIS N/A 01/09/2021   Procedure: HOT HEMOSTASIS (  ARGON PLASMA COAGULATION/BICAP);  Surgeon: Irene Shipper, MD;  Location: [facility] ENDOSCOPY;  Service: Endoscopy;  Laterality: N/A;  . INTERTROCHANTERIC HIP FRACTURE SURGERY Left 11/2006   [name] 03/08/2011  . INTRAVASCULAR ULTRASOUND/IVUS N/A 12/19/2019   Procedure: Intravascular Ultrasound/IVUS;  Surgeon: Jettie Booze, MD;  Location: [place] CV LAB;  Service: Cardiovascular;  Laterality: N/A;  . IR FLUORO GUIDE CV LINE RIGHT  07/24/2019  . IR FLUORO GUIDE CV LINE RIGHT  07/30/2019  . IR US GUIDE VASC ACCESS RIGHT  07/24/2019  . IR US GUIDE VASC ACCESS RIGHT  07/30/2019  . LAPAROSCOPIC CHOLECYSTECTOMY  05/2006  . LIGATION  OF COMPETING BRANCHES OF ARTERIOVENOUS FISTULA Left 11/05/2018   Procedure: LIGATION OF COMPETING BRANCHES OF ARTERIOVENOUS FISTULA  LEFT  ARM;  Surgeon: Marty Heck, MD;  Location: [place];  Service: Vascular;  Laterality: Left;  . LUMBAR LAMINECTOMY/DECOMPRESSION MICRODISCECTOMY N/A 02/29/2016   Procedure: Left L4-5 Lateral Recess Decompression, Removal Extradural Intraspinal Facet Cyst;  Surgeon: Marybelle Killings, MD;  Location: [place];  Service: Orthopedics;  Laterality: N/A;  . MULTIPLE TOOTH EXTRACTIONS    . ORIF MANDIBULAR FRACTURE Left 08/13/2004   ORIF of left body fracture mandible with KLS Jaryan Chicoine 2.3-mm six hole/notes 03/08/2011  . RIGHT/LEFT HEART CATH AND CORONARY/GRAFT ANGIOGRAPHY N/A 12/19/2019   Procedure: RIGHT/LEFT HEART CATH AND CORONARY/GRAFT ANGIOGRAPHY;  Surgeon: Jettie Booze, MD;  Location: [place] CV LAB;  Service: Cardiovascular;  Laterality: N/A;  . SCLEROTHERAPY  12/22/2019   Procedure: SCLEROTHERAPY;  Surgeon: Lavena Bullion, DO;  Location: [facility] ENDOSCOPY;  Service: Gastroenterology;;   Family History  Problem Relation Age of Onset  . Heart failure Father   . Hypertension Father   . Diabetes Brother   . Heart attack Brother   . Alzheimer's disease Mother   . Stroke Sister   . Diabetes Sister   . Alzheimer's disease Sister   . Hypertension Brother   . Diabetes Brother   . Drug abuse Brother   . Colon cancer Neg Hx    Social History:  reports that he has been smoking cigarettes. He has a 21.50 pack-year smoking history. He has never used smokeless tobacco. He reports current alcohol use of about 12.0 standard drinks of alcohol per week. He reports previous drug use. Drug: Cocaine.  ROS: As per HPI otherwise negative.  Physical Exam: Vitals:   01/24/21 1010 01/24/21 1015 01/24/21 1205 01/24/21 1313  BP: 125/67 127/63 127/60 125/61  Pulse: 82 84 85 75  Resp: 15 19 18 15   Temp:    (!) 97.5 F (36.4 C)  TempSrc:    Oral  SpO2: 99% 99% 99%       General: Well developed, well nourished, in no acute distress. Head: Normocephalic, atraumatic, sclera non-icteric, mucus membranes are moist. Neck: JVD not elevated. Lungs: Clear bilaterally to auscultation without wheezes, rales, or rhonchi. Breathing is unlabored on RA. Heart: RRR with normal S1, S2. No murmurs, rubs, or gallops appreciated. Abdomen: Soft, non-tender, non-distended with normoactive bowel sounds. No rebound/guarding. No obvious abdominal masses. Musculoskeletal:  Strength and tone appear normal for age. Lower extremities: No edema or ischemic changes, no open wounds. Neuro: Alert and oriented X 3. Moves all extremities spontaneously. Psych:  Responds to questions appropriately with a normal affect. Dialysis Access: LUE AVF + bruit  Allergies  Allergen Reactions  . Augmentin [Amoxicillin-Pot Clavulanate] Diarrhea and Other (See Comments)    Severe diarrhea  . Mucinex Fast-Max Other (See Comments)    Intense  sweating   . Amphetamines Other (See Comments)    Unknown reaction   Prior to Admission medications   Medication Sig Start Date End Date Taking? Authorizing Provider  acetaminophen (TYLENOL) 650 MG CR tablet Take 1,300 mg by mouth 3 (three) times daily.   Yes [provider]  allopurinol (ZYLOPRIM) 100 MG tablet TAKE 1 TABLET(100 MG) BY MOUTH DAILY Patient taking differently: Take 100 mg by mouth daily. 01/12/21  Yes [name], Dinah C, NP  ANORO ELLIPTA 62.5-25 MCG/INH AEPB INHALE 1 PUFF BY MOUTH EVERY DAY Patient taking differently: Inhale 1 puff into the lungs daily. 12/21/20  Yes [name], NP  aspirin EC 81 MG tablet Take 81 mg by mouth in the morning. Swallow whole.   Yes [provider]  bictegravir-emtricitabine-tenofovir AF (BIKTARVY) 50-200-25 MG TABS tablet Take 1 tablet by mouth daily. 06/30/20  Yes [name], MD  carvedilol (COREG) 3.125 MG tablet Take 1 tablet (3.125 mg total) by mouth 2 (two) times daily. 02/13/20  Yes  [name], MD  colchicine 0.6 MG tablet Take 0.6-1.2 mg by mouth daily as needed (as directed for gout flares).   Yes [provider]  diclofenac Sodium (VOLTAREN) 1 % GEL APPLY 2 GRAMS TO THE AFFECTED AREA THREE TIMES DAILY AS NEEDED FOR PAIN Patient taking differently: No sig reported 01/12/21  Yes [name], Dinah C, NP  folic acid (FOLVITE) 1 MG tablet Take 1 tablet (1 mg total) by mouth daily. 01/12/21  Yes [name], Dinah C, NP  gabapentin (NEURONTIN) 300 MG capsule Take 1 capsule (300 mg total) by mouth daily. Dose change due to renal function Patient taking differently: Take 300 mg by mouth daily. 12/09/20  Yes [name], NP  insulin lispro (HUMALOG KWIKPEN) 100 UNIT/ML KwikPen Inject 0.25ml (20 Units total) into the skin 3 (three) times daily as needed for high blood sugar (above 150) Patient taking differently: Inject 20 Units into the skin 3 (three) times daily as needed (for a BGL greater than 150). 03/12/20  Yes [name], NP  isosorbide mononitrate (IMDUR) 30 MG 24 hr tablet TAKE 1 TABLET BY MOUTH EVERY DAY Patient taking differently: Take 30 mg by mouth daily. 01/12/21  Yes [name], Dinah C, NP  latanoprost (XALATAN) 0.005 % ophthalmic solution Place 1 drop into both eyes at bedtime.   Yes [provider]  lidocaine-prilocaine (EMLA) cream Apply 1 application topically every Monday, Wednesday, and Friday with hemodialysis.   Yes [provider]  multivitamin (RENA-VIT) TABS tablet Take 1 tablet by mouth daily.   Yes [provider]  nitroGLYCERIN (NITROSTAT) 0.3 MG SL tablet ONE TABLET UNDER TONGUE AS NEEDED FOR CHEST PAIN Patient taking differently: Place 0.3 mg under the tongue as needed for chest pain. 05/01/19  Yes [name], MD  pantoprazole (PROTONIX) 40 MG tablet TAKE 1 TABLET(40 MG) BY MOUTH TWICE DAILY Patient taking differently: Take 40 mg by mouth 2 (two) times daily before a meal. 01/12/21  Yes [name], Dinah C, NP   polyethylene glycol (MIRALAX / GLYCOLAX) 17 g packet Take 17 g by mouth daily as needed for mild constipation. Patient taking differently: Take 17 g by mouth daily as needed for mild constipation (MIX AND DRINK). 11/07/20  Yes Aline August, MD  timolol (TIMOPTIC) 0.5 % ophthalmic solution Place 1 drop into both eyes in the morning. 04/04/19  Yes [provider]  B-D UF III MINI PEN NEEDLES 31G X 5 MM MISC USE FOUR TIMES DAILY 03/02/20  [name], NP  Calcium Acetate 667 MG TABS Take 667-1,334 mg by mouth See admin instructions. Take 1,334 mg by mouth three times a day with meals and 667 mg with each snack 01/20/21   [provider]  Cinacalcet HCl (SENSIPAR PO) Take 1 tablet by mouth 3 (three) times a week. At dialysis 08/14/20 08/13/21  [provider]  iron sucrose in sodium chloride 0.9 % 100 mL Iron Sucrose (Venofer) 07/08/20 07/07/21  [provider]  Lancets (ONETOUCH DELICA PLUS [id number]) MISC Inject 1 Device as directed 3 (three) times daily. Dx: E11.40 09/18/19   [name], NP  Methoxy PEG-Epoetin Beta (MIRCERA IJ) Mircera 12/25/20   [provider]  ONETOUCH ULTRA test strip CHECK BLOOD SUGAR 3 TIMES  DAILY Patient taking differently: 1 each by Other route 3 (three) times daily. 05/27/20   [name], NP  traMADol (ULTRAM) 50 MG tablet Take by mouth every 12 (twelve) hours as needed. Patient not taking: No sig reported    [provider]   Current Facility-Administered Medications  Medication Dose Route Frequency Provider Last Rate Last Admin  . ondansetron (ZOFRAN) tablet 4 mg  4 mg Oral Q6H PRN Fuller Plan A, MD       Or  . ondansetron (ZOFRAN) injection 4 mg  4 mg Intravenous Q6H PRN Smith, Rondell A, MD      . sodium chloride flush (NS) 0.9 % injection 3 mL  3 mL Intravenous Q12H Norval Morton, MD       Current Outpatient Medications  Medication Sig Dispense Refill  . acetaminophen (TYLENOL) 650 MG  CR tablet Take 1,300 mg by mouth 3 (three) times daily.    [name] allopurinol (ZYLOPRIM) 100 MG tablet TAKE 1 TABLET(100 MG) BY MOUTH DAILY (Patient taking differently: Take 100 mg by mouth daily.) 90 tablet 1  . ANORO ELLIPTA 62.5-25 MCG/INH AEPB INHALE 1 PUFF BY MOUTH EVERY DAY (Patient taking differently: Inhale 1 puff into the lungs daily.) 60 each 5  . aspirin EC 81 MG tablet Take 81 mg by mouth in the morning. Swallow whole.    . bictegravir-emtricitabine-tenofovir AF (BIKTARVY) 50-200-25 MG TABS tablet Take 1 tablet by mouth daily. 30 tablet 11  . carvedilol (COREG) 3.125 MG tablet Take 1 tablet (3.125 mg total) by mouth 2 (two) times daily. 180 tablet 3  . colchicine 0.6 MG tablet Take 0.6-1.2 mg by mouth daily as needed (as directed for gout flares).    . diclofenac Sodium (VOLTAREN) 1 % GEL APPLY 2 GRAMS TO THE AFFECTED AREA THREE TIMES DAILY AS NEEDED FOR PAIN (Patient taking differently: No sig reported) 299 g 0  . folic acid (FOLVITE) 1 MG tablet Take 1 tablet (1 mg total) by mouth daily. 90 tablet 1  . gabapentin (NEURONTIN) 300 MG capsule Take 1 capsule (300 mg total) by mouth daily. Dose change due to renal function (Patient taking differently: Take 300 mg by mouth daily.) 90 capsule 1  . insulin lispro (HUMALOG KWIKPEN) 100 UNIT/ML KwikPen Inject 0.30ml (20 Units total) into the skin 3 (three) times daily as needed for high blood sugar (above 150) (Patient taking differently: Inject 20 Units into the skin 3 (three) times daily as needed (for a BGL greater than 150).) 15 pen 1  . isosorbide mononitrate (IMDUR) 30 MG 24 hr tablet TAKE 1 TABLET BY MOUTH EVERY DAY (Patient taking differently: Take 30 mg by mouth daily.) 90 tablet 1  . latanoprost (XALATAN) 0.005 % ophthalmic  solution Place 1 drop into both eyes at bedtime.    . lidocaine-prilocaine (EMLA) cream Apply 1 application topically every Monday, Wednesday, and Friday with hemodialysis.    [name] multivitamin (RENA-VIT) TABS tablet Take 1  tablet by mouth daily.    . nitroGLYCERIN (NITROSTAT) 0.3 MG SL tablet ONE TABLET UNDER TONGUE AS NEEDED FOR CHEST PAIN (Patient taking differently: Place 0.3 mg under the tongue as needed for chest pain.) 25 tablet 4  . pantoprazole (PROTONIX) 40 MG tablet TAKE 1 TABLET(40 MG) BY MOUTH TWICE DAILY (Patient taking differently: Take 40 mg by mouth 2 (two) times daily before a meal.) 180 tablet 1  . polyethylene glycol (MIRALAX / GLYCOLAX) 17 g packet Take 17 g by mouth daily as needed for mild constipation. (Patient taking differently: Take 17 g by mouth daily as needed for mild constipation (MIX AND DRINK).) 14 each 0  . timolol (TIMOPTIC) 0.5 % ophthalmic solution Place 1 drop into both eyes in the morning.    . B-D UF III MINI PEN NEEDLES 31G X 5 MM MISC USE FOUR TIMES DAILY 100 each 11  . Calcium Acetate 667 MG TABS Take 667-1,334 mg by mouth See admin instructions. Take 1,334 mg by mouth three times a day with meals and 667 mg with each snack    . Cinacalcet HCl (SENSIPAR PO) Take 1 tablet by mouth 3 (three) times a week. At dialysis    . iron sucrose in sodium chloride 0.9 % 100 mL Iron Sucrose (Venofer)    . Lancets (ONETOUCH DELICA PLUS [id number]) MISC Inject 1 Device as directed 3 (three) times daily. Dx: E11.40 300 each 3  . Methoxy PEG-Epoetin Beta (MIRCERA IJ) Mircera    . ONETOUCH ULTRA test strip CHECK BLOOD SUGAR 3 TIMES  DAILY (Patient taking differently: 1 each by Other route 3 (three) times daily.) 300 strip 3  . traMADol (ULTRAM) 50 MG tablet Take by mouth every 12 (twelve) hours as needed. (Patient not taking: No sig reported)     Labs: Basic Metabolic Panel: Recent Labs  Lab 01/24/21 1013  NA 137  K 4.9  CL 95*  CO2 26  GLUCOSE 156*  BUN 75*  CREATININE 8.39*  CALCIUM 8.9   CBC: Recent Labs  Lab 01/24/21 1013  WBC 4.9  NEUTROABS 3.1  HGB 6.7*  HCT 22.1*  MCV 92.1  PLT 178   Outpatient Dialysis Orders: Center: [facility]  on MWF . Time: 4  hours, 180NRe, BFR 400, DFR 500, EDW 77kg, 2K/2Ca, AVF 15g, no heparin mircera 200 mcg given 01/20/21 Venofer 100mg  IV q HD x 5 doses ordered but not started yet (last tsat 6.0 on 3/25) Calcitriol 3.39mcg PO q HD  Assessment/Plan: 1.  Recurrent GI bleed/anemia: Hgb 6.7 on admission. History of recurrent GI bleed with most recent endoscopy in 12/2020. PRBC ordered, management of bleeding per GI. ESA recently dosed. Last outpatient tsat was 6%. Will order dose of IV Fe with dialysis tomorrow. 2.  ESRD:  Dialyzes on MWF schedule. No evidence of uremia or volume overload on exam. Continue MWF schedule with HD tomorrow 3.  Hypertension/volume: BP controlled, not volume overloaded on exam. Was leaving below his outpatient EDW, may need to be lowered a bit, will follow weights.  4.  Metabolic bone disease: Calcium controlled. Continue calcitriol with HD. Phosphorus binder changed from sevelemar to fosrenol last month due to recurrent GI bleed but fosrenol caused GI upset, so binder changed to calcium acetate. Will resume calcium  acetate here once tolerating PO intake 5.  Nutrition:  Advance diet per GI 6. T2DM: Management per primary team   Anice Paganini, PA-C 01/24/2021, 1:34 PM  [facility] Pager: [phone]

## 2021-01-24 NOTE — ED Notes (Addendum)
Dr. Maryan Rued notified of low H&H.

## 2021-01-24 NOTE — ED Triage Notes (Signed)
BIB EMS for black tarry stool. Pt was told to take iron and has not started yet. Dialysis Friday and was told he was anemic. History of same.

## 2021-01-24 NOTE — ED Notes (Signed)
ED Provider at bedside. 

## 2021-01-24 NOTE — H&P (Addendum)
History and Physical    Jacob [name] RDE:[id number] DOB: February 16, 1951 DOA: 01/24/2021  Referring MD/NP/PA: Blanchie Dessert, MD PCP: [name], NP  Patient coming from: Home via EMS  Chief Complaint: Black stools  I have personally briefly reviewed patient's old medical records in [place]   HPI: Jacob [name] is a 70 y.o. male with medical history significant of ESRD on HD(MWF), CAD, HIV, COPD, diabetes mellitus type 2, and recurrent GI bleed secondary to AVMs presents with complaints of black stools starting yesterday.  Patient reported having a bowel movement with dark blood stool that started out formed and turned to liquid.  Noted some abdominal discomfort while having the bowel movement and had some mild lightheadedness with standing.  He had a second black stool this morning.  Denies any loss of consciousness or recent fall.  He states he has been to the hospital approximately 9 times with bleeding this year alone.  He had been hospitalized 2/28-3/3 for GI bleed at which time patient underwent EGD on 3/1 by Dr. Carlean Purl and was found to have a single nodule in the duodenal bulb with active bleeding for which argon plasma was partially successful and hemostasis was achieved with hemostatic clips patient had required 5 units of PRBCs during that admission.  At that time he had been continued on Brilinta due to his complex history of coronary artery disease.  However, patient returned to the hospital on 3/17-3/19 for GI bleed.  Patient underwent EGD on 3/19 where his wound had three 1-4 mm  angiodysplastic lesions with bleeding in the stomach treated with argon plasma.  Patient was advised to be monitored overnight thereafter, but left AGAINST MEDICAL ADVICE.  He has been advised to discontinue Brilinta, but to okay to stay on aspirin.  He reports that he has only been taking the aspirin and has not taken Brilinta since.  ED Course: Upon admission into the emergency department  patient had stable vital signs.  Labs significant for hemoglobin 6.7( Hbg 9 on 3/22), potassium 4.9, CO2 26, BUN 7 5, creatinine 2.39, and anion gap 16.  Stool guaiacs were noted to be positive.  Patient was typed and screened and ordered 2 units of packed red blood cells.  [facility] was formally consulted.   Review of Systems  Constitutional: Negative for chills and fever.  HENT: Negative for ear discharge and nosebleeds.   Eyes: Negative for photophobia and pain.  Respiratory: Positive for shortness of breath.   Cardiovascular: Negative for chest pain and leg swelling.  Gastrointestinal: Positive for abdominal pain and blood in stool. Negative for nausea and vomiting.  Genitourinary: Negative for dysuria and hematuria.  Musculoskeletal: Negative for falls.  Neurological: Positive for dizziness. Negative for loss of consciousness.  Endo/Heme/Allergies: Bruises/bleeds easily.  Psychiatric/Behavioral: Positive for suicidal ideas. Negative for memory loss.    Past Medical History:  Diagnosis Date  . Acute respiratory failure ([place]) 03/01/2018  . Arthritis    "all over; mostly knees and back" (02/28/2018)  . Chronic lower back pain    stenosis  . Community acquired pneumonia 09/06/2013  . COPD (chronic obstructive pulmonary disease) ([place])   . Coronary atherosclerosis of native coronary artery    a. 02/2003 s/p CABG x 2 (VG->RI, VG->RPDA; b. 11/2019 PCI: LM nl, LAD 90d, D3 50, RI 100, LCX 100p, OM3 100 - fills via L->L collats from D2/dLAD, RCA 100p, VG->RPDA ok, VG->RI 95 (3.5x48 Synergy XD DES).  . Drug abuse ([place])  hx; tested for cocaine as recently as 2/08. says he is not using drugs now - avoided defib. for this reason   . ESRD (end stage renal disease) ([place])    Hemo M-W-F- Richarda Blade  . Fall at home 10/2020  . GERD (gastroesophageal reflux disease)    takes OTC meds as needed  . GI bleeding    a. 11/2019 EGD: angiodysplastic lesions w/ bleeding s/p argon plasma/clipping/epi inj.  .  Glaucoma    uses eye drops daily  . Hepatitis B 1968   "tx'd w/isolation; caught it from toilet stools in gym"  . HFrEF (heart failure with reduced ejection fraction) ([place])    a. 01/2019 Echo: EF 40-45%, diffuse HK, mild basal septal hypertrophy.  . History of blood transfusion 03/01/2019  . History of colon polyps    benign  . History of gout    takes Allopurinol daily as well as Colchicine-if needed (02/28/2018)  . History of kidney stones   . HTN (hypertension)    takes Coreg,Imdur.and Apresoline daily  . Human immunodeficiency virus (HIV) disease ([place]) dx'd 31   on [place] as of 12/2020.    [name] Hyperlipidemia    takes Atorvastatin daily  . Ischemic cardiomyopathy    a. 01/2019 Echo: EF 40-45%, diffuse HK, mild basal septal hypertrophy. Diast dysfxn. Nl RV size/fxn. Sev dil LA. Triv MR/TR/PR.  [name] Muscle spasm    takes Zanaflex as needed  . Myocardial infarction ([place]) ~ 2004/2005  . Nocturia   . Peripheral neuropathy    takes gabapentin daily  . Pneumonia    "at least twice" (02/28/2018)  . Syphilis, unspecified   . Type II diabetes mellitus ([place]) 2004   Lantus daily.Average fasting blood sugar 125-199  . Wears glasses   . Wears partial dentures     Past Surgical History:  Procedure Laterality Date  . AV FISTULA PLACEMENT Left 08/02/2018   Procedure: ARTERIOVENOUS (AV) FISTULA CREATION  left arm radiocephlic;  Surgeon: Marty Heck, MD;  Location: [facility];  Service: Vascular;  Laterality: Left;  . AV FISTULA PLACEMENT Left 08/01/2019   Procedure: LEFT BRACHIOCEPHALIC ARTERIOVENOUS (AV) FISTULA CREATION;  Surgeon: [name], MD;  Location: [facility];  Service: Vascular;  Laterality: Left;  . BASCILIC VEIN TRANSPOSITION Left 10/03/2019   Procedure: BASILIC VEIN TRANSPOSITION LEFT SECOND STAGE;  Surgeon: [name], MD;  Location: [facility];  Service: Vascular;  Laterality: Left;  . CARDIAC CATHETERIZATION  10/2002; 12/19/2004   [name] 03/08/2011  . COLONOSCOPY  2013   Dr.John  Henrene Pastor   . CORONARY ARTERY BYPASS GRAFT  02/24/2003   CABG X2/notes 03/08/2011  . CORONARY STENT INTERVENTION N/A 12/19/2019   Procedure: CORONARY STENT INTERVENTION;  Surgeon: Jettie Booze, MD;  Location: [facility];  Service: Cardiovascular;  Laterality: N/A;  . ESOPHAGOGASTRODUODENOSCOPY (EGD) WITH PROPOFOL N/A 02/08/2019   Procedure: ESOPHAGOGASTRODUODENOSCOPY (EGD) WITH PROPOFOL;  Surgeon: Milus Banister, MD;  Location: [facility];  Service: Gastroenterology;  Laterality: N/A;  . ESOPHAGOGASTRODUODENOSCOPY (EGD) WITH PROPOFOL N/A 12/22/2019   Procedure: ESOPHAGOGASTRODUODENOSCOPY (EGD) WITH PROPOFOL;  Surgeon: Lavena Bullion, DO;  Location: [facility];  Service: Gastroenterology;  Laterality: N/A;  . ESOPHAGOGASTRODUODENOSCOPY (EGD) WITH PROPOFOL N/A 10/19/2020   Procedure: ESOPHAGOGASTRODUODENOSCOPY (EGD) WITH PROPOFOL;  Surgeon: Jackquline Denmark, MD;  Location[facility] END[facility]COPY;  Service: Endoscopy;  Laterality: N/A;  . ESOPHAGOGASTRODUODENOSCOPY (EGD) WITH PROPOFOL N/A 12/22/2020   Procedure: ESOPHAGOGASTRODUODENOSCOPY (EGD) WITH PROPOFOL;  Surgeon: Gatha Mayer, MD;  Location: [facility];  Service: Endoscopy;  Laterality: N/A;  . ESOPHAGOGASTRODUODENOSCOPY (EGD) WITH PROPOFOL N/A 01/09/2021   Procedure: ESOPHAGOGASTRODUODENOSCOPY (EGD) WITH PROPOFOL;  Surgeon: Irene Shipper, MD;  Location: [facility] ENDOSCOPY;  Service: Endoscopy;  Laterality: N/A;  . HEMOSTASIS CLIP PLACEMENT  12/22/2019   Procedure: HEMOSTASIS CLIP PLACEMENT;  Surgeon: Lavena Bullion, DO;  Location: [facility] ENDOSCOPY;  Service: Gastroenterology;;  . HEMOSTASIS CLIP PLACEMENT  12/22/2020   Procedure: HEMOSTASIS CLIP PLACEMENT;  Surgeon: Gatha Mayer, MD;  Location: [facility] ENDOSCOPY;  Service: Endoscopy;;  . HEMOSTASIS CONTROL  12/22/2020   Procedure: HEMOSTASIS CONTROL/hemospray;  Surgeon: Gatha Mayer, MD;  Location: [facility];  Service: Endoscopy;;  . HOT HEMOSTASIS N/A 02/08/2019   Procedure: HOT HEMOSTASIS  (ARGON PLASMA COAGULATION/BICAP);  Surgeon: Milus Banister, MD;  Location: [facility] ENDOSCOPY;  Service: Gastroenterology;  Laterality: N/A;  . HOT HEMOSTASIS N/A 12/22/2019   Procedure: HOT HEMOSTASIS (ARGON PLASMA COAGULATION/BICAP);  Surgeon: Lavena Bullion, DO;  Location: [facility] ENDOSCOPY;  Service: Gastroenterology;  Laterality: N/A;  . HOT HEMOSTASIS N/A 10/19/2020   Procedure: HOT HEMOSTASIS (ARGON PLASMA COAGULATION/BICAP);  Surgeon: Jackquline Denmark, MD;  Location: [facility] ENDOSCOPY;  Service: Endoscopy;  Laterality: N/A;  . HOT HEMOSTASIS N/A 12/22/2020   Procedure: HOT HEMOSTASIS (ARGON PLASMA COAGULATION/BICAP);  Surgeon: Gatha Mayer, MD;  Location: [facility] ENDOSCOPY;  Service: Endoscopy;  Laterality: N/A;  . HOT HEMOSTASIS N/A 01/09/2021   Procedure: HOT HEMOSTASIS (ARGON PLASMA COAGULATION/BICAP);  Surgeon: Irene Shipper, MD;  Location: [facility] ENDOSCOPY;  Service: Endoscopy;  Laterality: N/A;  . INTERTROCHANTERIC HIP FRACTURE SURGERY Left 11/2006   [name] 03/08/2011  . INTRAVASCULAR ULTRASOUND/IVUS N/A 12/19/2019   Procedure: Intravascular Ultrasound/IVUS;  Surgeon: Jettie Booze, MD;  Location: [place] CV LAB;  Service: Cardiovascular;  Laterality: N/A;  . IR FLUORO GUIDE CV LINE RIGHT  07/24/2019  . IR FLUORO GUIDE CV LINE RIGHT  07/30/2019  . IR US GUIDE VASC ACCESS RIGHT  07/24/2019  . IR US GUIDE VASC ACCESS RIGHT  07/30/2019  . LAPAROSCOPIC CHOLECYSTECTOMY  05/2006  . LIGATION OF COMPETING BRANCHES OF ARTERIOVENOUS FISTULA Left 11/05/2018   Procedure: LIGATION OF COMPETING BRANCHES OF ARTERIOVENOUS FISTULA  LEFT  ARM;  Surgeon: Marty Heck, MD;  Location: [facility];  Service: Vascular;  Laterality: Left;  . LUMBAR LAMINECTOMY/DECOMPRESSION MICRODISCECTOMY N/A 02/29/2016   Procedure: Left L4-5 Lateral Recess Decompression, Removal Extradural Intraspinal Facet Cyst;  Surgeon: Marybelle Killings, MD;  Location: [facility];  Service: Orthopedics;  Laterality: N/A;  . MULTIPLE TOOTH EXTRACTIONS    .  ORIF MANDIBULAR FRACTURE Left 08/13/2004   ORIF of left body fracture mandible with KLS Martin 2.3-mm six hole/notes 03/08/2011  . RIGHT/LEFT HEART CATH AND CORONARY/GRAFT ANGIOGRAPHY N/A 12/19/2019   Procedure: RIGHT/LEFT HEART CATH AND CORONARY/GRAFT ANGIOGRAPHY;  Surgeon: Jettie Booze, MD;  Location: [name] CV [facility];  Service: Cardiovascular;  Laterality: N/A;  . SCLEROTHERAPY  12/22/2019   Procedure: SCLEROTHERAPY;  Surgeon: Lavena Bullion, DO;  Location: [place] ENDOSCOPY;  Service: Gastroenterology;;     reports that he has been smoking cigarettes. He has a 21.50 pack-year smoking history. He has never used smokeless tobacco. He reports current alcohol use of about 12.0 standard drinks of alcohol per week. He reports previous drug use. Drug: Cocaine.  Allergies  Allergen Reactions  . Augmentin [Amoxicillin-Pot Clavulanate] Diarrhea    Severe diarrhea  . Mucinex Fast-Max Other (See Comments)    Intensive sweating   . Amphetamines Other (See Comments)    Unknown reaction  Family History  Problem Relation Age of Onset  . Heart failure Father   . Hypertension Father   . Diabetes Brother   . Heart attack Brother   . Alzheimer's disease Mother   . Stroke Sister   . Diabetes Sister   . Alzheimer's disease Sister   . Hypertension Brother   . Diabetes Brother   . Drug abuse Brother   . Colon cancer Neg Hx     Prior to Admission medications   Medication Sig Start Date End Date Taking? Authorizing Provider  acetaminophen (TYLENOL) 650 MG CR tablet Take 650-1,300 mg by mouth 3 (three) times daily.    [provider]  allopurinol (ZYLOPRIM) 100 MG tablet TAKE 1 TABLET(100 MG) BY MOUTH DAILY 01/12/21   [name], Dinah C, NP  ANORO ELLIPTA 62.5-25 MCG/INH AEPB INHALE 1 PUFF BY MOUTH EVERY DAY 12/21/20   [name], NP  B-D UF III MINI PEN NEEDLES 31G X 5 MM MISC USE FOUR TIMES DAILY 03/02/20   [name], NP  bictegravir-emtricitabine-tenofovir AF  (BIKTARVY) 50-200-25 MG TABS tablet Take 1 tablet by mouth daily. 06/30/20   [name], MD  carvedilol (COREG) 3.125 MG tablet Take 1 tablet (3.125 mg total) by mouth 2 (two) times daily. 02/13/20   [name], MD  Cinacalcet HCl (SENSIPAR PO) Take 1 tablet by mouth 3 (three) times a week. At dialysis 08/14/20 08/13/21  [provider]  colchicine 0.6 MG tablet Take 0.6-1.2 mg by mouth daily as needed (gout flare-up).    [provider]  diclofenac Sodium (VOLTAREN) 1 % GEL APPLY 2 GRAMS TO THE AFFECTED AREA THREE TIMES DAILY AS NEEDED FOR PAIN 01/12/21   [name], Dinah C, NP  folic acid (FOLVITE) 1 MG tablet Take 1 tablet (1 mg total) by mouth daily. 01/12/21   [name], Dinah C, NP  gabapentin (NEURONTIN) 300 MG capsule Take 1 capsule (300 mg total) by mouth daily. Dose change due to renal function 12/09/20   [name], NP  insulin lispro (HUMALOG KWIKPEN) 100 UNIT/ML KwikPen Inject 0.24ml (20 Units total) into the skin 3 (three) times daily as needed for high blood sugar (above 150) Patient taking differently: Inject 0.55ml (20 Units total) into the skin 3 (three) times daily as needed for high blood sugar (above 150) 03/12/20   [name], NP  iron sucrose in sodium chloride 0.9 % 100 mL Iron Sucrose (Venofer) 07/08/20 07/07/21  [provider]  isosorbide mononitrate (IMDUR) 30 MG 24 hr tablet TAKE 1 TABLET BY MOUTH EVERY DAY 01/12/21   [name], Dinah C, NP  Lancets (ONETOUCH DELICA PLUS LYYTKP54S) MISC Inject 1 Device as directed 3 (three) times daily. Dx: E11.40 09/18/19   [name], NP  lanthanum (FOSRENOL) 1000 MG chewable tablet Chew 1 tablet by mouth in the morning, at noon, and at bedtime. With meals. 01/11/21   [provider]  latanoprost (XALATAN) 0.005 % ophthalmic solution Place 1 drop into both eyes at bedtime.    [provider]  Methoxy PEG-Epoetin Beta (MIRCERA IJ) Mircera 12/25/20   [provider]   multivitamin (RENA-VIT) TABS tablet Take 1 tablet by mouth daily.    [provider]  nitroGLYCERIN (NITROSTAT) 0.3 MG SL tablet ONE TABLET UNDER TONGUE AS NEEDED FOR CHEST PAIN 05/01/19   [name], MD  [facility] ULTRA test strip CHECK BLOOD SUGAR 3 TIMES  DAILY 05/27/20   [name], NP  pantoprazole (PROTONIX) 40 MG tablet  TAKE 1 TABLET(40 MG) BY MOUTH TWICE DAILY 01/12/21   [name], Dinah C, NP  polyethylene glycol (MIRALAX / GLYCOLAX) 17 g packet Take 17 g by mouth daily as needed for mild constipation. 11/07/20   Aline August, MD  timolol (TIMOPTIC) 0.5 % ophthalmic solution Place 1 drop into both eyes daily. 04/04/19   [provider]  traMADol (ULTRAM) 50 MG tablet Take by mouth every 12 (twelve) hours as needed.    [provider]    Physical Exam:  Constitutional: Elderly male who appears to be in no acute distress Vitals:   01/24/21 1009 01/24/21 1010 01/24/21 1015 01/24/21 1205  BP: 125/67 125/67 127/63 127/60  Pulse: 83 82 84 85  Resp: 15 15 19 18   Temp: 97.8 F (36.6 C)     TempSrc: Oral     SpO2: 99% 99% 99% 99%   Eyes: PERRL, lids and conjunctivae normal ENMT: Mucous membranes are moist. Posterior pharynx clear of any exudate or lesions.  Neck: normal, supple, no masses, no thyromegaly.  No JVD. Respiratory: clear to auscultation bilaterally, no wheezing, no crackles. Normal respiratory effort. No accessory muscle use.  Cardiovascular: Regular rate and rhythm, no murmurs / rubs / gallops. No extremity edema. 2+ pedal pulses. No carotid bruits.  Left upper extremity fistula appreciated Abdomen: no tenderness, no masses palpated. No hepatosplenomegaly. Bowel sounds positive.  Musculoskeletal: no clubbing / cyanosis. No joint deformity upper and lower extremities. Good ROM, no contractures. Normal muscle tone.  Skin: no rashes, lesions, ulcers. No induration Neurologic: CN 2-12 grossly intact. Sensation intact, DTR normal. Strength 5/5  in all 4.  Psychiatric: Normal judgment and insight. Alert and oriented x 3. Normal mood.     Labs on Admission: I have personally reviewed following labs and imaging studies  CBC: Recent Labs  Lab 01/24/21 1013  WBC 4.9  NEUTROABS 3.1  HGB 6.7*  HCT 22.1*  MCV 92.1  PLT 604   Basic Metabolic Panel: Recent Labs  Lab 01/24/21 1013  NA 137  K 4.9  CL 95*  CO2 26  GLUCOSE 156*  BUN 75*  CREATININE 8.39*  CALCIUM 8.9   GFR: Estimated Creatinine Clearance: 8.9 mL/min (A) (by C-G formula based on SCr of 8.39 mg/dL (H)). Liver Function Tests: No results for input(s): AST, ALT, ALKPHOS, BILITOT, PROT, ALBUMIN in the last 168 hours. No results for input(s): LIPASE, AMYLASE in the last 168 hours. No results for input(s): AMMONIA in the last 168 hours. Coagulation Profile: No results for input(s): INR, PROTIME in the last 168 hours. Cardiac Enzymes: No results for input(s): CKTOTAL, CKMB, CKMBINDEX, TROPONINI in the last 168 hours. BNP (last 3 results) No results for input(s): PROBNP in the last 8760 hours. HbA1C: No results for input(s): HGBA1C in the last 72 hours. CBG: No results for input(s): GLUCAP in the last 168 hours. Lipid Profile: No results for input(s): CHOL, HDL, LDLCALC, TRIG, CHOLHDL, LDLDIRECT in the last 72 hours. Thyroid Function Tests: No results for input(s): TSH, T4TOTAL, FREET4, T3FREE, THYROIDAB in the last 72 hours. Anemia Panel: No results for input(s): VITAMINB12, FOLATE, FERRITIN, TIBC, IRON, RETICCTPCT in the last 72 hours. Urine analysis:    Component Value Date/Time   COLORURINE YELLOW 10/16/2020 2136   APPEARANCEUR CLEAR 10/16/2020 2136   LABSPEC 1.016 10/16/2020 2136   PHURINE 5.0 10/16/2020 2136   GLUCOSEU NEGATIVE 10/16/2020 2136   GLUCOSEU NEG mg/dL 09/20/2010 2058   HGBUR SMALL (A) 10/16/2020 2136   HGBUR negative 05/31/2010 0948  [place] NEGATIVE 10/16/2020 2136   [place] NEGATIVE 10/16/2020 2136   PROTEINUR 30 (A)  10/16/2020 2136   UROBILINOGEN 0.2 06/24/2015 2336   NITRITE NEGATIVE 10/16/2020 2136   LEUKOCYTESUR NEGATIVE 10/16/2020 2136   Sepsis Labs: No results found for this or any previous visit (from the past 240 hour(s)).   Radiological Exams on Admission: DG CHEST PORT 1 VIEW  Result Date: 01/24/2021 CLINICAL DATA:  Shortness of breath. EXAM: PORTABLE CHEST 1 VIEW COMPARISON:  12/11/2020 FINDINGS: Lungs are adequately inflated without focal airspace consolidation or effusion. Subtle hazy prominence of the perihilar markings unchanged likely mild chronic vascular congestion. Moderate stable cardiomegaly. Remainder of the exam is unchanged. IMPRESSION: Moderate stable cardiomegaly with suggestion of minimal chronic vascular congestion. Electronically Signed   By: Marin Olp M.D.   On: 01/24/2021 15:01      Assessment/Plan Acute blood loss secondary to recurrent GI bleed history of  AVM: Patient presents with complaints of having at least 2 dark black stools since yesterday evening.  Hemoglobin was noted to be 6.7, but previously had been 9 on 3/22.  Stool guaiacs were noted to be positive. [facility] have been formally consulted and had recently performed EGDs on 3/1 and 3/19 which have revealed a bleeding duodenal nodule treated with APC and hemostatic clips and AVMs treated with APC respectively.  Patient was typed and screened and ordered 2 units of packed red blood cells. -Admit to a medical telemetry bed -Clear liquid diet and n.p.o. after midnight -Continue with transfusion of 2 units of packed red blood cells -Continue to monitor H&H and transfuse blood products to maintain goal hemoglobin above 8 g/dL -Appreciate [facility] consultative services, we will follow for further recommendations  ESRD on HD: Patient normally dialyzes Monday, Wednesday, and Friday.  He last had a complete hemodialysis session on 4/1. -Dr. Johnney Ou nephrology formally consulted.  Patient would likely need routine  hemodialysis in a.m.  HIV: Last CD4 count was 403 on 06/30/2020. -Continue Biktarvy  Essential hypertension -Continue Coreg  CAD: Patient had been advised to stop Brilinta during last hospitalization,  but had been continued on aspirin. -Continue Coreg and Imdur -Hold aspirin  Combined systolic and diastolic CHF: Chronic.  Patient appears to be currently euvolemic.  Last echocardiogram revealed EF of 40-45% with grade 2 diastolic dysfunction.  Diabetes mellitus type 2 with polyneuropathy: Hemoglobin A1c the last noted to be 4.6 on 12/15/2020. -Hypoglycemic Protocols -CBGs q AC with very Sensitive SSI -Consider resuming home insulin regimen once able to be placed back on a renal diet -Continue gabapentin  COPD, without exacerbation: Chest x-ray showed stable cardiomegaly with minimal vascular congestion -Continue Anoro -Albuterol nebs as needed  Glaucoma -Continue latanoprost and timolol DVT prophylaxis: SCDs Code Status: Full Family Communication: Patient stated that he would update family Disposition Plan: Likely home Consults called: [facility] Admission status: Observation, possibly requiring less than 2 midnight stay depending on source of bleed  Norval Morton MD [facility]   If 7PM-7AM, please contact night-coverage   01/24/2021, 12:28 PM

## 2021-01-24 NOTE — H&P (View-Only) (Signed)
Referring Provider:  EDP, Dr. Maryan Rued Primary Care Physician:  [name], NP Primary Gastroenterologist:  Dr. Henrene Pastor  Reason for Consultation:  Anemia, GI bleed  HPI: Jacob [name] is a 70 y.o. male with PMH of Diastolic, systolic heart failure.  CABG 2004.  CAD.  Drug-eluting stent placed February 2021.  End-stage renal disease with hemodialysis MWF.  HIV.  COPD.  Patient with recurrent issues of heme positive stools, hematochezia, melena, transfusion requiring anemia.  Says that his blood counts were low at dialysis on Friday.  Last night he started having black stools and again this AM.  Hgb here was 6.7 grams down from 9 grams just 12 days ago.  Receiving one unit PRBCs.  He is not currently on his Brilinta, only ASA 81 mg since [facility].  01/09/2021 EGD for GI bleed 1. Bleeding gastric AVMs status post successful APC therapy 2. Otherwise unremarkable upper endoscopy.  12/22/2020 EGD for GI bleed - Blood in the duodenal bulb. - Nodule found in the duodenum. steady non-pulsatile bleeding seen at base, very close to distal aspect pylorus so positioning difficult - ? AVM Treated with argon plasma coagulation (APC) - partially successful Clips (MR conditional) were placed. hemostatic spray applied. No bleeding at end - The examination was otherwise normal.  10/19/20 EGD for GI bleed Gastric AVMs s/p APC treatment. - No specimens collected  12/22/19 EGD --bleeding gastric AVMs  April 2020 EGD --Gastric and duodenal bulb AVMs  Dec 2013 screening colonoscopy --moderate diverticulosis --2 mm polyp ( removed but not retrieved)    Past Medical History:  Diagnosis Date  . Acute respiratory failure ([place]) 03/01/2018  . Arthritis    "all over; mostly knees and back" (02/28/2018)  . Chronic lower back pain    stenosis  . Community acquired pneumonia 09/06/2013  . COPD (chronic obstructive pulmonary disease) ([place])   . Coronary  atherosclerosis of native coronary artery    a. 02/2003 s/p CABG x 2 (VG->RI, VG->RPDA; b. 11/2019 PCI: LM nl, LAD 90d, D3 50, RI 100, LCX 100p, OM3 100 - fills via L->L collats from D2/dLAD, RCA 100p, VG->RPDA ok, VG->RI 95 (3.5x48 Synergy XD DES).  . Drug abuse ([place])    hx; tested for cocaine as recently as 2/08. says he is not using drugs now - avoided defib. for this reason   . ESRD (end stage renal disease) ([place])    Hemo M-W-F- Richarda Blade  . Fall at home 10/2020  . GERD (gastroesophageal reflux disease)    takes OTC meds as needed  . GI bleeding    a. 11/2019 EGD: angiodysplastic lesions w/ bleeding s/p argon plasma/clipping/epi inj.  . Glaucoma    uses eye drops daily  . Hepatitis B 1968   "tx'd w/isolation; caught it from toilet stools in gym"  . HFrEF (heart failure with reduced ejection fraction) ([place])    a. 01/2019 Echo: EF 40-45%, diffuse HK, mild basal septal hypertrophy.  . History of blood transfusion 03/01/2019  . History of colon polyps    benign  . History of gout    takes Allopurinol daily as well as Colchicine-if needed (02/28/2018)  . History of kidney stones   . HTN (hypertension)    takes Coreg,Imdur.and Apresoline daily  . Human immunodeficiency virus (HIV) disease ([place]) dx'd 31   on [place] as of 12/2020.    [name] Hyperlipidemia    takes Atorvastatin daily  . Ischemic cardiomyopathy  a. 01/2019 Echo: EF 40-45%, diffuse HK, mild basal septal hypertrophy. Diast dysfxn. Nl RV size/fxn. Sev dil LA. Triv MR/TR/PR.  [name] Muscle spasm    takes Zanaflex as needed  . Myocardial infarction ([place]) ~ 2004/2005  . Nocturia   . Peripheral neuropathy    takes gabapentin daily  . Pneumonia    "at least twice" (02/28/2018)  . Syphilis, unspecified   . Type II diabetes mellitus ([place]) 2004   Lantus daily.Average fasting blood sugar 125-199  . Wears glasses   . Wears partial dentures     Past Surgical History:  Procedure Laterality Date  . AV FISTULA PLACEMENT Left 08/02/2018    Procedure: ARTERIOVENOUS (AV) FISTULA CREATION  left arm radiocephlic;  Surgeon: Marty Heck, MD;  Location: [place];  Service: Vascular;  Laterality: Left;  . AV FISTULA PLACEMENT Left 08/01/2019   Procedure: LEFT BRACHIOCEPHALIC ARTERIOVENOUS (AV) FISTULA CREATION;  Surgeon: [name], MD;  Location: [place];  Service: Vascular;  Laterality: Left;  . BASCILIC VEIN TRANSPOSITION Left 10/03/2019   Procedure: BASILIC VEIN TRANSPOSITION LEFT SECOND STAGE;  Surgeon: [name], MD;  Location: [place];  Service: Vascular;  Laterality: Left;  . CARDIAC CATHETERIZATION  10/2002; 12/19/2004   [name] 03/08/2011  . COLONOSCOPY  2013   Dr.John Henrene Pastor   . CORONARY ARTERY BYPASS GRAFT  02/24/2003   CABG X2/notes 03/08/2011  . CORONARY STENT INTERVENTION N/A 12/19/2019   Procedure: CORONARY STENT INTERVENTION;  Surgeon: Jettie Booze, MD;  Location: [place] CV LAB;  Service: Cardiovascular;  Laterality: N/A;  . ESOPHAGOGASTRODUODENOSCOPY (EGD) WITH PROPOFOL N/A 02/08/2019   Procedure: ESOPHAGOGASTRODUODENOSCOPY (EGD) WITH PROPOFOL;  Surgeon: Milus Banister, MD;  Location: [place];  Service: Gastroenterology;  Laterality: N/A;  . ESOPHAGOGASTRODUODENOSCOPY (EGD) WITH PROPOFOL N/A 12/22/2019   Procedure: ESOPHAGOGASTRODUODENOSCOPY (EGD) WITH PROPOFOL;  Surgeon: Lavena Bullion, DO;  Location: [place];  Service: Gastroenterology;  Laterality: N/A;  . ESOPHAGOGASTRODUODENOSCOPY (EGD) WITH PROPOFOL N/A 10/19/2020   Procedure: ESOPHAGOGASTRODUODENOSCOPY (EGD) WITH PROPOFOL;  Surgeon: Jackquline Denmark, MD;  Location: [facility] ENDOSCOPY;  Service: Endoscopy;  Laterality: N/A;  . ESOPHAGOGASTRODUODENOSCOPY (EGD) WITH PROPOFOL N/A 12/22/2020   Procedure: ESOPHAGOGASTRODUODENOSCOPY (EGD) WITH PROPOFOL;  Surgeon: Gatha Mayer, MD;  Location: [place];  Service: Endoscopy;  Laterality: N/A;  . ESOPHAGOGASTRODUODENOSCOPY (EGD) WITH PROPOFOL N/A 01/09/2021   Procedure: ESOPHAGOGASTRODUODENOSCOPY  (EGD) WITH PROPOFOL;  Surgeon: Irene Shipper, MD;  Location: [facility] ENDOSCOPY;  Service: Endoscopy;  Laterality: N/A;  . HEMOSTASIS CLIP PLACEMENT  12/22/2019   Procedure: HEMOSTASIS CLIP PLACEMENT;  Surgeon: Lavena Bullion, DO;  Location: [place] ENDOSCOPY;  Service: Gastroenterology;;  . HEMOSTASIS CLIP PLACEMENT  12/22/2020   Procedure: HEMOSTASIS CLIP PLACEMENT;  Surgeon: Gatha Mayer, MD;  Location: [facility] ENDOSCOPY;  Service: Endoscopy;;  . HEMOSTASIS CONTROL  12/22/2020   Procedure: HEMOSTASIS CONTROL/hemospray;  Surgeon: Gatha Mayer, MD;  Location: [place];  Service: Endoscopy;;  . HOT HEMOSTASIS N/A 02/08/2019   Procedure: HOT HEMOSTASIS (ARGON PLASMA COAGULATION/BICAP);  Surgeon: Milus Banister, MD;  Location: [facility] ENDOSCOPY;  Service: Gastroenterology;  Laterality: N/A;  . HOT HEMOSTASIS N/A 12/22/2019   Procedure: HOT HEMOSTASIS (ARGON PLASMA COAGULATION/BICAP);  Surgeon: Lavena Bullion, DO;  Location: [facility] ENDOSCOPY;  Service: Gastroenterology;  Laterality: N/A;  . HOT HEMOSTASIS N/A 10/19/2020   Procedure: HOT HEMOSTASIS (ARGON PLASMA COAGULATION/BICAP);  Surgeon: Jackquline Denmark, MD;  Location: [facility] ENDOSCOPY;  Service: Endoscopy;  Laterality: N/A;  . HOT HEMOSTASIS N/A 12/22/2020   Procedure: HOT HEMOSTASIS (  ARGON PLASMA COAGULATION/BICAP);  Surgeon: Gatha Mayer, MD;  Location: [name] ENDOSCOPY;  Service: Endoscopy;  Laterality: N/A;  . HOT HEMOSTASIS N/A 01/09/2021   Procedure: HOT HEMOSTASIS (ARGON PLASMA COAGULATION/BICAP);  Surgeon: Irene Shipper, MD;  Location: [facility] ENDOSCOPY;  Service: Endoscopy;  Laterality: N/A;  . INTERTROCHANTERIC HIP FRACTURE SURGERY Left 11/2006   [name] 03/08/2011  . INTRAVASCULAR ULTRASOUND/IVUS N/A 12/19/2019   Procedure: Intravascular Ultrasound/IVUS;  Surgeon: Jettie Booze, MD;  Location: [place] CV LAB;  Service: Cardiovascular;  Laterality: N/A;  . IR FLUORO GUIDE CV LINE RIGHT  07/24/2019  . IR FLUORO GUIDE CV LINE RIGHT  07/30/2019  . IR US  GUIDE VASC ACCESS RIGHT  07/24/2019  . IR US GUIDE VASC ACCESS RIGHT  07/30/2019  . LAPAROSCOPIC CHOLECYSTECTOMY  05/2006  . LIGATION OF COMPETING BRANCHES OF ARTERIOVENOUS FISTULA Left 11/05/2018   Procedure: LIGATION OF COMPETING BRANCHES OF ARTERIOVENOUS FISTULA  LEFT  ARM;  Surgeon: Marty Heck, MD;  Location: [place];  Service: Vascular;  Laterality: Left;  . LUMBAR LAMINECTOMY/DECOMPRESSION MICRODISCECTOMY N/A 02/29/2016   Procedure: Left L4-5 Lateral Recess Decompression, Removal Extradural Intraspinal Facet Cyst;  Surgeon: Marybelle Killings, MD;  Location: [place];  Service: Orthopedics;  Laterality: N/A;  . MULTIPLE TOOTH EXTRACTIONS    . ORIF MANDIBULAR FRACTURE Left 08/13/2004   ORIF of left body fracture mandible with KLS Martin 2.3-mm six hole/notes 03/08/2011  . RIGHT/LEFT HEART CATH AND CORONARY/GRAFT ANGIOGRAPHY N/A 12/19/2019   Procedure: RIGHT/LEFT HEART CATH AND CORONARY/GRAFT ANGIOGRAPHY;  Surgeon: Jettie Booze, MD;  Location: [place] CV LAB;  Service: Cardiovascular;  Laterality: N/A;  . SCLEROTHERAPY  12/22/2019   Procedure: SCLEROTHERAPY;  Surgeon: Lavena Bullion, DO;  Location: [place] ENDOSCOPY;  Service: Gastroenterology;;    Prior to Admission medications   Medication Sig Start Date End Date Taking? Authorizing Provider  allopurinol (ZYLOPRIM) 100 MG tablet TAKE 1 TABLET(100 MG) BY MOUTH DAILY Patient taking differently: Take 100 mg by mouth daily. 01/12/21  Yes [name], Dinah C, NP  bictegravir-emtricitabine-tenofovir AF (BIKTARVY) 50-200-25 MG TABS tablet Take 1 tablet by mouth daily. 06/30/20  Yes [name], MD  carvedilol (COREG) 3.125 MG tablet Take 1 tablet (3.125 mg total) by mouth 2 (two) times daily. 02/13/20  Yes [name], MD  folic acid (FOLVITE) 1 MG tablet Take 1 tablet (1 mg total) by mouth daily. 01/12/21  Yes [name], Dinah C, NP  gabapentin (NEURONTIN) 300 MG capsule Take 1 capsule (300 mg total) by mouth daily. Dose change due to renal  function 12/09/20  Yes Eubanks, [name], NP  insulin lispro (HUMALOG KWIKPEN) 100 UNIT/ML KwikPen Inject 0.68ml (20 Units total) into the skin 3 (three) times daily as needed for high blood sugar (above 150) Patient taking differently: Inject 0.27ml (20 Units total) into the skin 3 (three) times daily as needed for high blood sugar (above 150) 03/12/20  Yes [name], NP  isosorbide mononitrate (IMDUR) 30 MG 24 hr tablet TAKE 1 TABLET BY MOUTH EVERY DAY 01/12/21  Yes [name], Dinah C, NP  acetaminophen (TYLENOL) 650 MG CR tablet Take 650-1,300 mg by mouth 3 (three) times daily.    [provider]  Celedonio Miyamoto 62.5-25 MCG/INH AEPB INHALE 1 PUFF BY MOUTH EVERY DAY 12/21/20   [name], NP  B-D UF III MINI PEN NEEDLES 31G X 5 MM MISC USE FOUR TIMES DAILY 03/02/20   [name], NP  Calcium Acetate 667 MG TABS Take by  mouth. 01/20/21   [provider]  Cinacalcet HCl (SENSIPAR PO) Take 1 tablet by mouth 3 (three) times a week. At dialysis 08/14/20 08/13/21  [provider]  colchicine 0.6 MG tablet Take 0.6-1.2 mg by mouth daily as needed (gout flare-up).    [provider]  diclofenac Sodium (VOLTAREN) 1 % GEL APPLY 2 GRAMS TO THE AFFECTED AREA THREE TIMES DAILY AS NEEDED FOR PAIN 01/12/21   [name], Dinah C, NP  iron sucrose in sodium chloride 0.9 % 100 mL Iron Sucrose (Venofer) 07/08/20 07/07/21  [provider]  Lancets (ONETOUCH DELICA PLUS [id number]) MISC Inject 1 Device as directed 3 (three) times daily. Dx: E11.40 09/18/19   [name], NP  latanoprost (XALATAN) 0.005 % ophthalmic solution Place 1 drop into both eyes at bedtime.    [provider]  Methoxy PEG-Epoetin Beta (MIRCERA IJ) Mircera 12/25/20   [provider]  multivitamin (RENA-VIT) TABS tablet Take 1 tablet by mouth daily.    [provider]  nitroGLYCERIN (NITROSTAT) 0.3 MG SL tablet ONE TABLET UNDER TONGUE AS NEEDED FOR CHEST PAIN 05/01/19    [name], MD  [facility] ULTRA test strip CHECK BLOOD SUGAR 3 TIMES  DAILY 05/27/20   [name], NP  pantoprazole (PROTONIX) 40 MG tablet TAKE 1 TABLET(40 MG) BY MOUTH TWICE DAILY 01/12/21   [name], Dinah C, NP  polyethylene glycol (MIRALAX / GLYCOLAX) 17 g packet Take 17 g by mouth daily as needed for mild constipation. 11/07/20   Aline August, MD  timolol (TIMOPTIC) 0.5 % ophthalmic solution Place 1 drop into both eyes daily. 04/04/19   [provider]  traMADol (ULTRAM) 50 MG tablet Take by mouth every 12 (twelve) hours as needed.    [provider]    Current Facility-Administered Medications  Medication Dose Route Frequency Provider Last Rate Last Admin  . ondansetron (ZOFRAN) tablet 4 mg  4 mg Oral Q6H PRN Fuller Plan A, MD       Or  . ondansetron (ZOFRAN) injection 4 mg  4 mg Intravenous Q6H PRN Smith, Rondell A, MD      . sodium chloride flush (NS) 0.9 % injection 3 mL  3 mL Intravenous Q12H Norval Morton, MD       Current Outpatient Medications  Medication Sig Dispense Refill  . allopurinol (ZYLOPRIM) 100 MG tablet TAKE 1 TABLET(100 MG) BY MOUTH DAILY (Patient taking differently: Take 100 mg by mouth daily.) 90 tablet 1  . bictegravir-emtricitabine-tenofovir AF (BIKTARVY) 50-200-25 MG TABS tablet Take 1 tablet by mouth daily. 30 tablet 11  . carvedilol (COREG) 3.125 MG tablet Take 1 tablet (3.125 mg total) by mouth 2 (two) times daily. 462 tablet 3  . folic acid (FOLVITE) 1 MG tablet Take 1 tablet (1 mg total) by mouth daily. 90 tablet 1  . gabapentin (NEURONTIN) 300 MG capsule Take 1 capsule (300 mg total) by mouth daily. Dose change due to renal function 90 capsule 1  . insulin lispro (HUMALOG KWIKPEN) 100 UNIT/ML KwikPen Inject 0.29ml (20 Units total) into the skin 3 (three) times daily as needed for high blood sugar (above 150) (Patient taking differently: Inject 0.20ml (20 Units total) into the skin 3 (three) times daily as needed for high blood  sugar (above 150)) 15 pen 1  . isosorbide mononitrate (IMDUR) 30 MG 24 hr tablet TAKE 1 TABLET BY MOUTH EVERY DAY 90 tablet 1  . acetaminophen (TYLENOL) 650 MG CR tablet Take 650-1,300 mg by mouth  3 (three) times daily.    Jearl Klinefelter ELLIPTA 62.5-25 MCG/INH AEPB INHALE 1 PUFF BY MOUTH EVERY DAY 60 each 5  . B-D UF III MINI PEN NEEDLES 31G X 5 MM MISC USE FOUR TIMES DAILY 100 each 11  . Calcium Acetate 667 MG TABS Take by mouth.    . Cinacalcet HCl (SENSIPAR PO) Take 1 tablet by mouth 3 (three) times a week. At dialysis    . colchicine 0.6 MG tablet Take 0.6-1.2 mg by mouth daily as needed (gout flare-up).    . diclofenac Sodium (VOLTAREN) 1 % GEL APPLY 2 GRAMS TO THE AFFECTED AREA THREE TIMES DAILY AS NEEDED FOR PAIN 600 g 0  . iron sucrose in sodium chloride 0.9 % 100 mL Iron Sucrose (Venofer)    . Lancets (ONETOUCH DELICA PLUS [id number]) MISC Inject 1 Device as directed 3 (three) times daily. Dx: E11.40 300 each 3  . latanoprost (XALATAN) 0.005 % ophthalmic solution Place 1 drop into both eyes at bedtime.    . Methoxy PEG-Epoetin Beta (MIRCERA IJ) Mircera    . multivitamin (RENA-VIT) TABS tablet Take 1 tablet by mouth daily.    . nitroGLYCERIN (NITROSTAT) 0.3 MG SL tablet ONE TABLET UNDER TONGUE AS NEEDED FOR CHEST PAIN 25 tablet 4  . ONETOUCH ULTRA test strip CHECK BLOOD SUGAR 3 TIMES  DAILY 300 strip 3  . pantoprazole (PROTONIX) 40 MG tablet TAKE 1 TABLET(40 MG) BY MOUTH TWICE DAILY 180 tablet 1  . polyethylene glycol (MIRALAX / GLYCOLAX) 17 g packet Take 17 g by mouth daily as needed for mild constipation. 14 each 0  . timolol (TIMOPTIC) 0.5 % ophthalmic solution Place 1 drop into both eyes daily.    . traMADol (ULTRAM) 50 MG tablet Take by mouth every 12 (twelve) hours as needed.      Allergies as of 01/24/2021 - Review Complete 01/24/2021  Allergen Reaction Noted  . Augmentin [amoxicillin-pot clavulanate] Diarrhea 12/05/2018  . Mucinex fast-max Other (See Comments) 11/30/2020  .  Amphetamines Other (See Comments) 01/15/2012    Family History  Problem Relation Age of Onset  . Heart failure Father   . Hypertension Father   . Diabetes Brother   . Heart attack Brother   . Alzheimer's disease Mother   . Stroke Sister   . Diabetes Sister   . Alzheimer's disease Sister   . Hypertension Brother   . Diabetes Brother   . Drug abuse Brother   . Colon cancer Neg Hx     Social History   Socioeconomic History  . Marital status: Single    Spouse name: Not on file  . Number of children: 1  . Years of education: Not on file  . Highest education level: Not on file  Occupational History  . Occupation: retired  Tobacco Use  . Smoking status: Current Every Day Smoker    Packs/day: 0.50    Years: 43.00    Pack years: 21.50    Types: Cigarettes  . Smokeless tobacco: Never Used  Vaping Use  . Vaping Use: Never used  Substance and Sexual Activity  . Alcohol use: Yes    Alcohol/week: 12.0 standard drinks    Types: 12 Standard drinks or equivalent per week    Comment: occassional  . Drug use: Not Currently    Types: Cocaine    Comment: hx of crack/cocaine 46yrs ago 10/01/2019- none  . Sexual activity: Yes    Partners: Male  Other Topics Concern  . Not on file  Social History Narrative   Retired.       As of 05/2015:   Diet: No salt    Caffeine   Married: Single    House: Condo, 2 stories, 1 person (self)    Pets: No    Current/Past profession: N/A   Exercise: walks daily    Living Will: Yes    DNR   POA/HPOA: No       Social Determinants of Health   Financial Resource Strain: Low Risk   . Difficulty of Paying Living Expenses: Not hard at all  Food Insecurity: No Food Insecurity  . Worried About Charity fundraiser in the Last Year: Never true  . Ran Out of Food in the Last Year: Never true  Transportation Needs: No Transportation Needs  . Lack of Transportation (Medical): No  . Lack of Transportation (Non-Medical): No  Physical Activity: Not on  file  Stress: Not on file  Social Connections: Not on file  Intimate Partner Violence: Not on file    Review of Systems: ROS is O/W negative except as mentioned in HPI.  Physical Exam: Vital signs in last 24 hours: Temp:  [97.8 F (36.6 C)] 97.8 F (36.6 C) (04/03 1009) Pulse Rate:  [82-85] 85 (04/03 1205) Resp:  [15-19] 18 (04/03 1205) BP: (125-127)/(60-67) 127/60 (04/03 1205) SpO2:  [99 %] 99 % (04/03 1205)   General:  Alert, Well-developed, well-nourished, pleasant and cooperative in NAD Head:  Normocephalic and atraumatic. Eyes:  Sclera clear, no icterus.  Conjunctiva pink. Ears:  Normal auditory acuity. Mouth:  No deformity or lesions.   Lungs:  Clear throughout to auscultation.  No wheezes, crackles, or rhonchi.  Heart:  Regular rate and rhythm; no murmurs, clicks, rubs, or gallops. Abdomen:  Soft, non-distended.  BS present.  Non-tender.   Msk:  Symmetrical without gross deformities. Pulses:  Normal pulses noted. Extremities:  Without clubbing or edema. Neurologic:  Alert and  oriented x4;  grossly normal neurologically. Skin:  Intact without significant lesions or rashes. Psych:  Alert and cooperative. Normal mood and affect.  Lab Results: Recent Labs    01/24/21 1013  WBC 4.9  HGB 6.7*  HCT 22.1*  PLT 178   BMET Recent Labs    01/24/21 1013  NA 137  K 4.9  CL 95*  CO2 26  GLUCOSE 156*  BUN 75*  CREATININE 8.39*  CALCIUM 8.9   IMPRESSION:  # Recurrent GI bleeding with black stools just since yesterday and acute on chronic anemia.  Bleeding probably related to gastric / intestinal AVMs.  ? Ulcers related to previous APC treatment. --Hgb 6.7 grams, down from 9.0 grams just 12 days ago.  Receiving one unit PRBCs.  # CAD / remote CABG / stent Feb 6948 / chronic systolic and diastolic heart failure:  Has not been on his Brilinta, only ASA 81 mg daily [facility].  # ESRD on HD Mon,W,Fr  # HIV on  therapy  PLAN: -Will plan for enteroscopy on 4/4 with intervention if needed. -Monitor Hgb and transfuse further prn. -Pantoprazole 40 mg BID for now. -Ok for clear liquids then NPO after midnight.  Laban Emperor. Zehr  01/24/2021, 12:53 PM    Attending physician's note   I have taken an interval history, reviewed the chart and examined the patient. I agree with the Advanced Practitioner's note, impression and recommendations.   Recurrent melena despite being off Brilinta. Hb 8.3 to 6.7. Multiple prev  EGDs showing Gastric AVMs s/p APC (most recently March 19). EGD 3/1 did show a bleeding duodenal nodule s/p APC/Clips/hemospray.    Multiple comorbid conditions including ESRD on HD (MWF), HIV, CAD s/p CABG with PTCA/DES Feb 2021, CHF, COPD, DM2, HTN.  Plan: -IV Protonix. -Agree with transfusion. Keep Hb>7. -Enteroscopy in AM with Dr Loletha Carrow. If neg VCE after prep. Not keen on getting colon. (last colon 09/2012- neg) -Can continue ASA    Carmell Austria, MD [facility] [phone]

## 2021-01-24 NOTE — ED Notes (Signed)
Pt is tolerating blood transfusion. No sign of allergic reaction. Call bell within reach.

## 2021-01-25 ENCOUNTER — Encounter (HOSPITAL_COMMUNITY): Payer: Self-pay | Admitting: Internal Medicine

## 2021-01-25 ENCOUNTER — Observation Stay (HOSPITAL_COMMUNITY): Payer: Medicare Other | Admitting: Certified Registered Nurse Anesthetist

## 2021-01-25 ENCOUNTER — Encounter (HOSPITAL_COMMUNITY)
Admission: EM | Disposition: A | Discharge status: Home Health | Payer: Self-pay | Source: Home / Self Care | Attending: Internal Medicine

## 2021-01-25 DIAGNOSIS — E1142 Type 2 diabetes mellitus with diabetic polyneuropathy: Secondary | ICD-10-CM | POA: Diagnosis not present

## 2021-01-25 DIAGNOSIS — M109 Gout, unspecified: Secondary | ICD-10-CM | POA: Diagnosis present

## 2021-01-25 DIAGNOSIS — D62 Acute posthemorrhagic anemia: Secondary | ICD-10-CM

## 2021-01-25 DIAGNOSIS — I2510 Atherosclerotic heart disease of native coronary artery without angina pectoris: Secondary | ICD-10-CM | POA: Diagnosis not present

## 2021-01-25 DIAGNOSIS — H409 Unspecified glaucoma: Secondary | ICD-10-CM | POA: Diagnosis present

## 2021-01-25 DIAGNOSIS — B9681 Helicobacter pylori [H. pylori] as the cause of diseases classified elsewhere: Secondary | ICD-10-CM | POA: Diagnosis not present

## 2021-01-25 DIAGNOSIS — Z833 Family history of diabetes mellitus: Secondary | ICD-10-CM | POA: Diagnosis not present

## 2021-01-25 DIAGNOSIS — E785 Hyperlipidemia, unspecified: Secondary | ICD-10-CM | POA: Diagnosis not present

## 2021-01-25 DIAGNOSIS — K922 Gastrointestinal hemorrhage, unspecified: Secondary | ICD-10-CM

## 2021-01-25 DIAGNOSIS — K31811 Angiodysplasia of stomach and duodenum with bleeding: Secondary | ICD-10-CM | POA: Diagnosis not present

## 2021-01-25 DIAGNOSIS — N250 Renal osteodystrophy: Secondary | ICD-10-CM | POA: Diagnosis not present

## 2021-01-25 DIAGNOSIS — I5042 Chronic combined systolic (congestive) and diastolic (congestive) heart failure: Secondary | ICD-10-CM | POA: Diagnosis not present

## 2021-01-25 DIAGNOSIS — Z79899 Other long term (current) drug therapy: Secondary | ICD-10-CM | POA: Diagnosis not present

## 2021-01-25 DIAGNOSIS — K2970 Gastritis, unspecified, without bleeding: Secondary | ICD-10-CM

## 2021-01-25 DIAGNOSIS — Z951 Presence of aortocoronary bypass graft: Secondary | ICD-10-CM | POA: Diagnosis not present

## 2021-01-25 DIAGNOSIS — K921 Melena: Secondary | ICD-10-CM | POA: Diagnosis not present

## 2021-01-25 DIAGNOSIS — R531 Weakness: Secondary | ICD-10-CM | POA: Diagnosis not present

## 2021-01-25 DIAGNOSIS — K2950 Unspecified chronic gastritis without bleeding: Secondary | ICD-10-CM | POA: Diagnosis not present

## 2021-01-25 DIAGNOSIS — Z794 Long term (current) use of insulin: Secondary | ICD-10-CM | POA: Diagnosis not present

## 2021-01-25 DIAGNOSIS — E1122 Type 2 diabetes mellitus with diabetic chronic kidney disease: Secondary | ICD-10-CM | POA: Diagnosis not present

## 2021-01-25 DIAGNOSIS — N2581 Secondary hyperparathyroidism of renal origin: Secondary | ICD-10-CM | POA: Diagnosis not present

## 2021-01-25 DIAGNOSIS — I252 Old myocardial infarction: Secondary | ICD-10-CM | POA: Diagnosis not present

## 2021-01-25 DIAGNOSIS — Z7902 Long term (current) use of antithrombotics/antiplatelets: Secondary | ICD-10-CM | POA: Diagnosis not present

## 2021-01-25 DIAGNOSIS — K3189 Other diseases of stomach and duodenum: Secondary | ICD-10-CM | POA: Diagnosis not present

## 2021-01-25 DIAGNOSIS — Z992 Dependence on renal dialysis: Secondary | ICD-10-CM | POA: Diagnosis not present

## 2021-01-25 DIAGNOSIS — I10 Essential (primary) hypertension: Secondary | ICD-10-CM | POA: Diagnosis not present

## 2021-01-25 DIAGNOSIS — E1129 Type 2 diabetes mellitus with other diabetic kidney complication: Secondary | ICD-10-CM | POA: Diagnosis not present

## 2021-01-25 DIAGNOSIS — I255 Ischemic cardiomyopathy: Secondary | ICD-10-CM | POA: Diagnosis present

## 2021-01-25 DIAGNOSIS — F17210 Nicotine dependence, cigarettes, uncomplicated: Secondary | ICD-10-CM | POA: Diagnosis present

## 2021-01-25 DIAGNOSIS — N186 End stage renal disease: Secondary | ICD-10-CM | POA: Diagnosis not present

## 2021-01-25 DIAGNOSIS — D500 Iron deficiency anemia secondary to blood loss (chronic): Secondary | ICD-10-CM | POA: Diagnosis not present

## 2021-01-25 DIAGNOSIS — I132 Hypertensive heart and chronic kidney disease with heart failure and with stage 5 chronic kidney disease, or end stage renal disease: Secondary | ICD-10-CM | POA: Diagnosis not present

## 2021-01-25 DIAGNOSIS — I5020 Unspecified systolic (congestive) heart failure: Secondary | ICD-10-CM | POA: Diagnosis not present

## 2021-01-25 DIAGNOSIS — Q2730 Arteriovenous malformation, site unspecified: Secondary | ICD-10-CM | POA: Diagnosis not present

## 2021-01-25 DIAGNOSIS — K219 Gastro-esophageal reflux disease without esophagitis: Secondary | ICD-10-CM | POA: Diagnosis present

## 2021-01-25 DIAGNOSIS — Z8616 Personal history of COVID-19: Secondary | ICD-10-CM | POA: Diagnosis not present

## 2021-01-25 DIAGNOSIS — J449 Chronic obstructive pulmonary disease, unspecified: Secondary | ICD-10-CM | POA: Diagnosis present

## 2021-01-25 HISTORY — PX: BIOPSY: SHX5522

## 2021-01-25 HISTORY — PX: HOT HEMOSTASIS: SHX5433

## 2021-01-25 HISTORY — PX: ENTEROSCOPY: SHX5533

## 2021-01-25 LAB — POCT I-STAT, CHEM 8
BUN: 26 mg/dL — ABNORMAL HIGH (ref 8–23)
Calcium, Ion: 0.98 mmol/L — ABNORMAL LOW (ref 1.15–1.40)
Chloride: 99 mmol/L (ref 98–111)
Creatinine, Ser: 3.6 mg/dL — ABNORMAL HIGH (ref 0.61–1.24)
Glucose, Bld: 115 mg/dL — ABNORMAL HIGH (ref 70–99)
HCT: 32 % — ABNORMAL LOW (ref 39.0–52.0)
Hemoglobin: 10.9 g/dL — ABNORMAL LOW (ref 13.0–17.0)
Potassium: 3.7 mmol/L (ref 3.5–5.1)
Sodium: 138 mmol/L (ref 135–145)
TCO2: 29 mmol/L (ref 22–32)

## 2021-01-25 LAB — TYPE AND SCREEN
ABO/RH(D): A POS
Antibody Screen: NEGATIVE
Unit division: 0
Unit division: 0

## 2021-01-25 LAB — CBC
HCT: 26.5 % — ABNORMAL LOW (ref 39.0–52.0)
Hemoglobin: 8.9 g/dL — ABNORMAL LOW (ref 13.0–17.0)
MCH: 28.9 pg (ref 26.0–34.0)
MCHC: 33.6 g/dL (ref 30.0–36.0)
MCV: 86 fL (ref 80.0–100.0)
Platelets: 149 10*3/uL — ABNORMAL LOW (ref 150–400)
RBC: 3.08 MIL/uL — ABNORMAL LOW (ref 4.22–5.81)
RDW: 15.9 % — ABNORMAL HIGH (ref 11.5–15.5)
WBC: 6.4 10*3/uL (ref 4.0–10.5)
nRBC: 0 % (ref 0.0–0.2)

## 2021-01-25 LAB — BPAM RBC
Blood Product Expiration Date: 202204242359
Blood Product Expiration Date: 202204262359
ISSUE DATE / TIME: 202204031303
ISSUE DATE / TIME: 202204031629
Unit Type and Rh: 6200
Unit Type and Rh: 6200

## 2021-01-25 LAB — RENAL FUNCTION PANEL
Albumin: 3.2 g/dL — ABNORMAL LOW (ref 3.5–5.0)
Anion gap: 18 — ABNORMAL HIGH (ref 5–15)
BUN: 83 mg/dL — ABNORMAL HIGH (ref 8–23)
CO2: 25 mmol/L (ref 22–32)
Calcium: 9.1 mg/dL (ref 8.9–10.3)
Chloride: 94 mmol/L — ABNORMAL LOW (ref 98–111)
Creatinine, Ser: 8.56 mg/dL — ABNORMAL HIGH (ref 0.61–1.24)
GFR, Estimated: 6 mL/min — ABNORMAL LOW (ref >60)
Glucose, Bld: 109 mg/dL — ABNORMAL HIGH (ref 70–99)
Phosphorus: 10.9 mg/dL — ABNORMAL HIGH (ref 2.5–4.6)
Potassium: 4.5 mmol/L (ref 3.5–5.1)
Sodium: 137 mmol/L (ref 135–145)

## 2021-01-25 LAB — GLUCOSE, CAPILLARY
Glucose-Capillary: 136 mg/dL — ABNORMAL HIGH (ref 70–99)
Glucose-Capillary: 148 mg/dL — ABNORMAL HIGH (ref 70–99)
Glucose-Capillary: 184 mg/dL — ABNORMAL HIGH (ref 70–99)
Glucose-Capillary: 113 mg/dL — ABNORMAL HIGH (ref 70–99)

## 2021-01-25 SURGERY — ESOPHAGOGASTRODUODENOSCOPY (EGD) WITH PROPOFOL
Anesthesia: Monitor Anesthesia Care

## 2021-01-25 SURGERY — ENTEROSCOPY
Anesthesia: Monitor Anesthesia Care

## 2021-01-25 MED ORDER — GLUCAGON HCL RDNA (DIAGNOSTIC) 1 MG IJ SOLR
INTRAMUSCULAR | Status: DC | PRN
  Administered 2021-01-25: .5 mg via INTRAVENOUS

## 2021-01-25 MED ORDER — PROPOFOL 10 MG/ML IV BOLUS
INTRAVENOUS | Status: DC | PRN
  Administered 2021-01-25 (×5): 20 mg via INTRAVENOUS
  Administered 2021-01-25: 30 mg via INTRAVENOUS

## 2021-01-25 MED ORDER — GLUCAGON HCL RDNA (DIAGNOSTIC) 1 MG IJ SOLR
INTRAMUSCULAR | Status: AC
  Filled 2021-01-25: qty 1

## 2021-01-25 MED ORDER — PROPOFOL 500 MG/50ML IV EMUL
INTRAVENOUS | Status: DC | PRN
  Administered 2021-01-25: 120 ug/kg/min via INTRAVENOUS
  Administered 2021-01-25: 100 ug/kg/min via INTRAVENOUS

## 2021-01-25 MED ORDER — LIDOCAINE 2% (20 MG/ML) 5 ML SYRINGE
INTRAMUSCULAR | Status: DC | PRN
  Administered 2021-01-25: 40 mg via INTRAVENOUS

## 2021-01-25 MED ORDER — ACETAMINOPHEN 325 MG PO TABS
650.0000 mg | ORAL_TABLET | Freq: Once | ORAL | Status: AC
  Administered 2021-01-25: 650 mg via ORAL
  Filled 2021-01-25: qty 2

## 2021-01-25 MED ORDER — SODIUM CHLORIDE 0.9 % IV SOLN: INTRAVENOUS | Status: DC | PRN

## 2021-01-25 MED ORDER — PHENYLEPHRINE 40 MCG/ML (10ML) SYRINGE FOR IV PUSH (FOR BLOOD PRESSURE SUPPORT)
PREFILLED_SYRINGE | INTRAVENOUS | Status: DC | PRN
  Administered 2021-01-25 (×2): 80 ug via INTRAVENOUS

## 2021-01-25 MED ORDER — SODIUM CHLORIDE 0.9 % IV SOLN: INTRAVENOUS | Status: DC

## 2021-01-25 NOTE — Anesthesia Preprocedure Evaluation (Addendum)
Anesthesia Evaluation  Patient identified by MRN, date of birth, ID band Patient awake    Reviewed: Allergy & Precautions, NPO status , Patient's Chart, lab work & pertinent test results, reviewed documented beta blocker date and time   History of Anesthesia Complications Negative for: history of anesthetic complications  Airway Mallampati: II  TM Distance: >3 FB Neck ROM: Full    Dental  (+) Partial Upper, Missing,    Pulmonary COPD, Current Smoker and Patient abstained from smoking.,    Pulmonary exam normal        Cardiovascular hypertension, Pt. on medications and Pt. on home beta blockers + CAD, + Past MI (2004) and +CHF  Normal cardiovascular exam     Neuro/Psych negative neurological ROS  negative psych ROS   GI/Hepatic GERD  Medicated and Controlled,(+) Hepatitis -, BGIB   Endo/Other  diabetes, Type 2, Insulin Dependent  Renal/GU ESRF and DialysisRenal disease (M/W/F)  negative genitourinary   Musculoskeletal  (+) Arthritis ,   Abdominal   Peds  Hematology  (+) anemia , HIV, Hgb 8.9   Anesthesia Other Findings Day of surgery medications reviewed with patient.  Reproductive/Obstetrics negative OB ROS                            Anesthesia Physical Anesthesia Plan  ASA: III  Anesthesia Plan: MAC   Post-op Pain Management:    Induction:   PONV Risk Score and Plan: Treatment may vary due to age or medical condition and Propofol infusion  Airway Management Planned: Natural Airway and Nasal Cannula  Additional Equipment: None  Intra-op Plan:   Post-operative Plan:   Informed Consent: I have reviewed the patients History and Physical, chart, labs and discussed the procedure including the risks, benefits and alternatives for the proposed anesthesia with the patient or authorized representative who has indicated his/her understanding and acceptance.       Plan Discussed  with: CRNA  Anesthesia Plan Comments:        Anesthesia Quick Evaluation

## 2021-01-25 NOTE — Progress Notes (Signed)
PROGRESS NOTE        PATIENT DETAILS Name: Jacob [name] Age: 70 y.o. Sex: male Date of Birth: 12/01/1950 Admit Date: 01/24/2021 Admitting Physician Norval Morton, MD EPP:IRJJOAC, [name], NP  Brief Narrative: Patient is a 70 y.o. male with history of ESRD on HD, CAD-most recent PCI February 2021, recurrent GI bleeding due to AVMs (numerous recent admissions), DM-2, HIV, COPD-presented with upper GI bleeding and acute blood loss anemia.  See below for further details.  Significant events: 4/3>> admit for upper GI bleeding and acute blood loss anemia.  Significant studies: 4/3>> chest x-ray: No pneumonia  Antimicrobial therapy: None  Microbiology data: None  Procedures : None  Consults: GI, nephrology  DVT Prophylaxis : SCDs Start: 01/24/21 1237    Subjective: Earlier this morning in hemodialysis-denies any further melena since admission.   Assessment/Plan: Upper GI bleeding with acute blood loss anemia: Recurrent issue-likely due to AVMs-s/p PRBC transfusion on admission-hemoglobin now stable.  On PPI-GI following with plans for EGD/enteroscopy.    ESRD: On HD-nephrology following and directing care  HIV (CD4 count 403 September 2021): Continue antiretrovirals  HTN: Stable-continue Coreg and Imdur  CAD: Hold aspirin-no longer on Brilinta-last PCI February 2021.  On Coreg/Imdur.  Chronic combined systolic and diastolic heart failure (EF 40-45% by echo on 09/2020): Euvolemic-volume removal with HD  COPD: No exacerbation-continue bronchodilators  DM-2 (A1c 4.6 on 12/15/2020): Continue SSI-once diet resumed-may need to be started on usual insulin regimen.  Recent Labs    01/24/21 1659 01/24/21 2102 01/25/21 0546  GLUCAP 95 184* 136*   Glaucoma: Continue latanoprost and timolol  Peripheral neuropathy: Stable-continue Neurontin  Gout: No flare-continue allopurinol   Diet: Diet Order            Diet NPO time specified   Diet effective midnight                  Code Status: Full code   Family Communication: None at bedside   Disposition Plan: Status is: Observation  The patient will require care spanning > 2 midnights and should be moved to inpatient because: Inpatient level of care appropriate due to severity of illness  Dispo: The patient is from: Home              Anticipated d/c is to: Home              Patient currently is not medically stable to d/c.   Difficult to place patient No     Barriers to Discharge: GI bleeding-requiring PRBC transfusion-awaiting EGD/enteroscopy  Antimicrobial agents: Anti-infectives (From admission, onward)   Start     Dose/Rate Route Frequency Ordered Stop   01/25/21 1000  bictegravir-emtricitabine-tenofovir AF (BIKTARVY) 50-200-25 MG per tablet 1 tablet        1 tablet Oral Daily 01/24/21 1339         Time spent: 25- minutes-Greater than 50% of this time was spent in counseling, explanation of diagnosis, planning of further management, and coordination of care.  MEDICATIONS: Scheduled Meds: . allopurinol  100 mg Oral Daily  . bictegravir-emtricitabine-tenofovir AF  1 tablet Oral Daily  . calcium acetate  1,334 mg Oral TID WC   And  . calcium acetate  667 mg Oral With snacks  . carvedilol  3.125 mg Oral BID  . Chlorhexidine Gluconate Cloth  6 each Topical  O1308  . folic acid  1 mg Oral Daily  . gabapentin  300 mg Oral Daily  . insulin aspart  0-6 Units Subcutaneous TID WC  . isosorbide mononitrate  30 mg Oral Daily  . latanoprost  1 drop Both Eyes QHS  . pantoprazole  40 mg Oral BID AC  . sodium chloride flush  3 mL Intravenous Q12H  . timolol  1 drop Both Eyes q AM  . umeclidinium-vilanterol  1 puff Inhalation Daily   Continuous Infusions: . ferric gluconate (FERRLECIT/NULECIT) IV     PRN Meds:.albuterol, diclofenac Sodium, ondansetron **OR** ondansetron (ZOFRAN) IV   PHYSICAL EXAM: Vital signs: Vitals:   01/25/21 0830 01/25/21  0900 01/25/21 0930 01/25/21 1000  BP: 139/63 (!) 136/57 (!) 107/51 122/60  Pulse: 85 80  76  Resp: 14  16   Temp:      TempSrc:      SpO2:      Weight:      Height:       Filed Weights   01/24/21 1840 01/25/21 0725  Weight: 77 kg 76.4 kg   Body mass index is 21.63 kg/m.   Gen Exam:Alert awake-not in any distress HEENT:atraumatic, normocephalic Chest: B/L clear to auscultation anteriorly CVS:S1S2 regular Abdomen:soft non tender, non distended Extremities:no edema Neurology: Non focal Skin: no rash  I have personally reviewed following labs and imaging studies  LABORATORY DATA: CBC: Recent Labs  Lab 01/24/21 1013 01/25/21 0427  WBC 4.9 6.4  NEUTROABS 3.1  --   HGB 6.7* 8.9*  HCT 22.1* 26.5*  MCV 92.1 86.0  PLT 178 149*    Basic Metabolic Panel: Recent Labs  Lab 01/24/21 1013 01/25/21 0427  NA 137 137  K 4.9 4.5  CL 95* 94*  CO2 26 25  GLUCOSE 156* 109*  BUN 75* 83*  CREATININE 8.39* 8.56*  CALCIUM 8.9 9.1  PHOS  --  10.9*    GFR: Estimated Creatinine Clearance: 8.4 mL/min (A) (by C-G formula based on SCr of 8.56 mg/dL (H)).  Liver Function Tests: Recent Labs  Lab 01/25/21 0427  ALBUMIN 3.2*   No results for input(s): LIPASE, AMYLASE in the last 168 hours. No results for input(s): AMMONIA in the last 168 hours.  Coagulation Profile: No results for input(s): INR, PROTIME in the last 168 hours.  Cardiac Enzymes: No results for input(s): CKTOTAL, CKMB, CKMBINDEX, TROPONINI in the last 168 hours.  BNP (last 3 results) No results for input(s): PROBNP in the last 8760 hours.  Lipid Profile: No results for input(s): CHOL, HDL, LDLCALC, TRIG, CHOLHDL, LDLDIRECT in the last 72 hours.  Thyroid Function Tests: No results for input(s): TSH, T4TOTAL, FREET4, T3FREE, THYROIDAB in the last 72 hours.  Anemia Panel: No results for input(s): VITAMINB12, FOLATE, FERRITIN, TIBC, IRON, RETICCTPCT in the last 72 hours.  Urine analysis:    Component  Value Date/Time   COLORURINE YELLOW 10/16/2020 2136   APPEARANCEUR CLEAR 10/16/2020 2136   LABSPEC 1.016 10/16/2020 2136   PHURINE 5.0 10/16/2020 2136   GLUCOSEU NEGATIVE 10/16/2020 2136   GLUCOSEU NEG mg/dL 09/20/2010 2058   HGBUR SMALL (A) 10/16/2020 2136   HGBUR negative 05/31/2010 0948   BILIRUBINUR NEGATIVE 10/16/2020 2136   KETONESUR NEGATIVE 10/16/2020 2136   PROTEINUR 30 (A) 10/16/2020 2136   UROBILINOGEN 0.2 06/24/2015 2336   NITRITE NEGATIVE 10/16/2020 2136   LEUKOCYTESUR NEGATIVE 10/16/2020 2136    Sepsis Labs: Lactic Acid, Venous    Component Value Date/Time   LATICACIDVEN 0.9 11/10/2016 0757  MICROBIOLOGY: No results found for this or any previous visit (from the past 240 hour(s)).  RADIOLOGY STUDIES/RESULTS: DG CHEST PORT 1 VIEW  Result Date: 01/24/2021 CLINICAL DATA:  Shortness of breath. EXAM: PORTABLE CHEST 1 VIEW COMPARISON:  12/11/2020 FINDINGS: Lungs are adequately inflated without focal airspace consolidation or effusion. Subtle hazy prominence of the perihilar markings unchanged likely mild chronic vascular congestion. Moderate stable cardiomegaly. Remainder of the exam is unchanged. IMPRESSION: Moderate stable cardiomegaly with suggestion of minimal chronic vascular congestion. Electronically Signed   By: Marin Olp M.D.   On: 01/24/2021 15:01     LOS: 0 days   Oren Binet, MD  [facility]    To contact the attending provider between 7A-7P or the covering provider during after hours 7P-7A, please log into the web site [email or web address] and access using universal [place] password for that web site. If you do not have the password, please call the hospital operator.  01/25/2021, 10:15 AM

## 2021-01-25 NOTE — Transfer of Care (Signed)
Immediate Anesthesia Transfer of Care Note  Patient: Jacob [name]  Procedure(s) Performed: ENTEROSCOPY (N/A ) HOT HEMOSTASIS (ARGON PLASMA COAGULATION/BICAP) (N/A ) BIOPSY  Patient Location: Endoscopy Unit  Anesthesia Type:MAC  Level of Consciousness: awake and alert   Airway & Oxygen Therapy: Patient Spontanous Breathing and Patient connected to nasal cannula oxygen  Post-op Assessment: Report given to RN and Post -op Vital signs reviewed and stable  Post vital signs: Reviewed and stable  Last Vitals:  Vitals Value Taken Time  BP 124/44 01/25/21 1351  Temp    Pulse 72 01/25/21 1352  Resp 24 01/25/21 1352  SpO2 100 % 01/25/21 1352  Vitals shown include unvalidated device data.  Last Pain:  Vitals:   01/25/21 1222  TempSrc: Oral  PainSc: 0-No pain         Complications: No complications documented.

## 2021-01-25 NOTE — Interval H&P Note (Signed)
History and Physical Interval Note:  01/25/2021 12:36 PM  Jacob [name]  has presented today for surgery, with the diagnosis of Anemia and GI bleed.  The various methods of treatment have been discussed with the patient and family. After consideration of risks, benefits and other options for treatment, the patient has consented to  Procedure(s): ENTEROSCOPY (N/A) as a surgical intervention.  The patient's history has been reviewed, patient examined, no change in status, stable for surgery.  I have reviewed the patient's chart and labs.  Questions were answered to the patient's satisfaction.    Patient's Hgb 8.9 this AM after Tfx yesterday.  He has just finished HD and is stable.  No CP or dyspnea or abdominal pain at present. He was agreeable to a small bowel enteroscopy and had no questions about it.  [name]

## 2021-01-25 NOTE — Op Note (Signed)
[facility] Patient Name: Jacob [name] Procedure Date : 01/25/2021 MRN: [id number] Attending MD: Estill Cotta. Loletha Carrow , MD Date of Birth: 05-03-1951 CSN: [id number] Age: 70 Admit Type: Inpatient Procedure:                Small bowel enteroscopy Indications:              Iron deficiency anemia secondary to chronic blood                            loss, Melena, Hx UGI AVMs requiring therapy (many                            prior scopes) Providers:                Estill Cotta. Loletha Carrow, MD, Baird Cancer, RN, Tyna Jaksch                            Technician Referring MD:             [facility] Medicines:                Monitored Anesthesia Care Complications:            No immediate complications. Estimated Blood Loss:     Estimated blood loss: none. Procedure:                Pre-Anesthesia Assessment:                           - Prior to the procedure, a History and Physical                            was performed, and patient medications and                            allergies were reviewed. The patient's tolerance of                            previous anesthesia was also reviewed. The risks                            and benefits of the procedure and the sedation                            options and risks were discussed with the patient.                            All questions were answered, and informed consent                            was obtained. Prior Anticoagulants: The patient has                            taken no previous anticoagulant or antiplatelet  agents. ASA Grade Assessment: IV - A patient with                            severe systemic disease that is a constant threat                            to life. After reviewing the risks and benefits,                            the patient was deemed in satisfactory condition to                            undergo the procedure.                           After obtaining informed  consent, the endoscope was                            passed under direct vision. Throughout the                            procedure, the patient's blood pressure, pulse, and                            oxygen saturations were monitored continuously. The                            PCF-H190DL ([id number]) Olympus pediatric colonoscope                            was introduced through the mouth and advanced to                            the proximal jejunum. The GIF-H190 ([id number])                            Olympus gastroscope was introduced through the and                            advanced to the. The small bowel enteroscopy was                            performed with difficulty due to the patient's                            agitation. The patient tolerated the procedure                            (required hig dose sedation and frequently coughed). Scope In: Scope Out: Findings:      The esophagus was normal.      Localized severe inflammation characterized by congestion (edema) and       erythema was found in the gastric fundus, in the gastric body and on the  greater curvature of the gastric body. Biopsies were taken with a cold       forceps for histology.      The exam of the stomach was otherwise normal.      A few angioectasias with bleeding (scant fresh blood seen in bulb on       scope insertion, then not active upon scope withdrawal) were found in       the duodenal bulb. They were all difficult to visualize, a few under       mucosal folds and one in the proximal superior/posterior bulb.       Coagulation for hemostasis using argon plasma at 1 liter/minute and 20       watts was successful.      Benign-appearing nodular mucosa was found in the duodenal bulb and in       the first portion of the duodenum.      The exam of the duodenum was otherwise normal.      There was no evidence of significant pathology in the proximal jejunum. Impression:               - Normal  esophagus.                           - Gastritis. Biopsied.                           - A few bleeding angioectasias in the duodenum.                            Treated with argon plasma coagulation (APC).                           - Nodular mucosa in the duodenal bulb and in the                            first portion of the duodenum.                           - The examined portion of the jejunum was normal. Recommendation:           - Return patient to hospital ward for ongoing care.                           - Cardiac diet.                           CBC tomorrow AM Procedure Code(s):        --- Professional ---                           [phone], 59, Small intestinal endoscopy, enteroscopy                            beyond second portion of duodenum, not including                            ileum; with control of bleeding (eg, injection,  bipolar cautery, unipolar cautery, laser, heater                            probe, stapler, plasma coagulator)                           44361, Small intestinal endoscopy, enteroscopy                            beyond second portion of duodenum, not including                            ileum; with biopsy, single or multiple Diagnosis Code(s):        --- Professional ---                           K29.70, Gastritis, unspecified, without bleeding                           K31.811, Angiodysplasia of stomach and duodenum                            with bleeding                           K31.89, Other diseases of stomach and duodenum                           D50.0, Iron deficiency anemia secondary to blood                            loss (chronic)                           K92.1, Melena (includes Hematochezia) CPT copyright 2019 American Medical Association. All rights reserved. The codes documented in this report are preliminary and upon coder review may  be revised to meet current compliance requirements. [name] L. Loletha Carrow,  MD 01/25/2021 1:53:24 PM This report has been signed electronically. Number of Addenda: 0

## 2021-01-25 NOTE — Anesthesia Postprocedure Evaluation (Signed)
Anesthesia Post Note  Patient: ISAAK DELMUNDO  Procedure(s) Performed: ENTEROSCOPY (N/A ) HOT HEMOSTASIS (ARGON PLASMA COAGULATION/BICAP) (N/A ) BIOPSY     Patient location during evaluation: PACU Anesthesia Type: MAC Level of consciousness: awake and alert and oriented Pain management: pain level controlled Vital Signs Assessment: post-procedure vital signs reviewed and stable Respiratory status: spontaneous breathing, nonlabored ventilation and respiratory function stable Cardiovascular status: blood pressure returned to baseline Postop Assessment: no apparent nausea or vomiting Anesthetic complications: no   No complications documented.  Last Vitals:  Vitals:   01/25/21 1409 01/25/21 1443  BP: (!) 132/49 129/63  Pulse: 73 75  Resp: 14 20  Temp:  36.8 C  SpO2: 100% 100%    Last Pain:  Vitals:   01/25/21 1409  TempSrc:   PainSc: 0-No pain                 Brennan Bailey

## 2021-01-25 NOTE — Progress Notes (Signed)
[facility] Progress Note   Subjective:  Seen on HD, 2.5L UFG and tolerating. No CP, dyspnea, abdominal pain. No melena today. Scheduled for enteroscopy today.  Objective Vitals:   01/25/21 0327 01/25/21 0725 01/25/21 0730 01/25/21 0735  BP: 130/68  128/65 132/62  Pulse: 79  85   Resp:   13 13  Temp: 97.8 F (36.6 C)  (!) 97.5 F (36.4 C)   TempSrc: Oral  Oral   SpO2: 100%  98%   Weight:  76.4 kg    Height:       Physical Exam General: Well appearing man, NAD. Room air. Heart: RRR; no murmur Lungs: CTA anteriorly Abdomen: soft, non-tender Extremities: No LE edema Dialysis Access: LUE AVF + bruit  Additional Objective Labs: Basic Metabolic Panel: Recent Labs  Lab 01/24/21 1013 01/25/21 0427  NA 137 137  K 4.9 4.5  CL 95* 94*  CO2 26 25  GLUCOSE 156* 109*  BUN 75* 83*  CREATININE 8.39* 8.56*  CALCIUM 8.9 9.1  PHOS  --  10.9*   Liver Function Tests: Recent Labs  Lab 01/25/21 0427  ALBUMIN 3.2*   CBC: Recent Labs  Lab 01/24/21 1013 01/25/21 0427  WBC 4.9 6.4  NEUTROABS 3.1  --   HGB 6.7* 8.9*  HCT 22.1* 26.5*  MCV 92.1 86.0  PLT 178 149*   Studies/Results: DG CHEST PORT 1 VIEW  Result Date: 01/24/2021 CLINICAL DATA:  Shortness of breath. EXAM: PORTABLE CHEST 1 VIEW COMPARISON:  12/11/2020 FINDINGS: Lungs are adequately inflated without focal airspace consolidation or effusion. Subtle hazy prominence of the perihilar markings unchanged likely mild chronic vascular congestion. Moderate stable cardiomegaly. Remainder of the exam is unchanged. IMPRESSION: Moderate stable cardiomegaly with suggestion of minimal chronic vascular congestion. Electronically Signed   By: Marin Olp M.D.   On: 01/24/2021 15:01   Medications: . ferric gluconate (FERRLECIT/NULECIT) IV     . allopurinol  100 mg Oral Daily  . bictegravir-emtricitabine-tenofovir AF  1 tablet Oral Daily  . calcium acetate  1,334 mg Oral TID WC   And  . calcium acetate  667 mg  Oral With snacks  . carvedilol  3.125 mg Oral BID  . Chlorhexidine Gluconate Cloth  6 each Topical Q0600  . folic acid  1 mg Oral Daily  . gabapentin  300 mg Oral Daily  . insulin aspart  0-6 Units Subcutaneous TID WC  . isosorbide mononitrate  30 mg Oral Daily  . latanoprost  1 drop Both Eyes QHS  . pantoprazole  40 mg Oral BID AC  . sodium chloride flush  3 mL Intravenous Q12H  . timolol  1 drop Both Eyes q AM  . umeclidinium-vilanterol  1 puff Inhalation Daily    Dialysis Orders: MWF at [facility] 4 hours, 180NRe, BFR 400, DFR 500, EDW 77kg, 2K/2Ca, AVF 15g, no heparin - Mircera 200 mcg given 01/20/21 - Venofer 100mg  IV q HD x 5 doses ordered but not started yet (last tsat 6.0 on 3/25) - Calcitriol 3.68mcg PO q HD  Assessment/Plan: 1.  Recurrent GI bleed/anemia: Hgb 6.7 on admit -> 1U PRBCs and 8.9 today. History of recurrent GI bleed with most recent endoscopy in 12/2020. ESA just dosed. GI consulted, for enteroscopy today. 2.  ESRD: Continue HD per MWF schedule. HD now, 2.5L UF, no heparin. 3.  Hypertension/volume: BP controlled, below prior EDW. UF as tolerated. 4.  Metabolic bone disease: Ca ok, Phos very high but NPO - restart binders once eating (Phoslo).  5.  Nutrition:  Advance diet per GI 6. T2DM: Management per primary team  7. HIV: Continue home meds   Veneta Penton, Hershal Coria 01/25/2021, 8:13 AM  [employer]

## 2021-01-25 NOTE — Anesthesia Procedure Notes (Signed)
Procedure Name: MAC Date/Time: 01/25/2021 12:51 PM Performed by: Reece Agar, CRNA Pre-anesthesia Checklist: Patient identified, Emergency Drugs available, Suction available and Patient being monitored Patient Re-evaluated:Patient Re-evaluated prior to induction Oxygen Delivery Method: Nasal cannula

## 2021-01-26 ENCOUNTER — Telehealth: Payer: Self-pay

## 2021-01-26 ENCOUNTER — Telehealth: Payer: Self-pay | Admitting: *Deleted

## 2021-01-26 ENCOUNTER — Ambulatory Visit: Payer: Medicare Other | Admitting: Physician Assistant

## 2021-01-26 DIAGNOSIS — Q2730 Arteriovenous malformation, site unspecified: Secondary | ICD-10-CM

## 2021-01-26 DIAGNOSIS — K31819 Angiodysplasia of stomach and duodenum without bleeding: Secondary | ICD-10-CM

## 2021-01-26 DIAGNOSIS — I255 Ischemic cardiomyopathy: Secondary | ICD-10-CM

## 2021-01-26 DIAGNOSIS — I25810 Atherosclerosis of coronary artery bypass graft(s) without angina pectoris: Secondary | ICD-10-CM

## 2021-01-26 DIAGNOSIS — I10 Essential (primary) hypertension: Secondary | ICD-10-CM

## 2021-01-26 DIAGNOSIS — E1122 Type 2 diabetes mellitus with diabetic chronic kidney disease: Secondary | ICD-10-CM

## 2021-01-26 LAB — RENAL FUNCTION PANEL
Albumin: 3.2 g/dL — ABNORMAL LOW (ref 3.5–5.0)
Anion gap: 14 (ref 5–15)
BUN: 37 mg/dL — ABNORMAL HIGH (ref 8–23)
CO2: 26 mmol/L (ref 22–32)
Calcium: 9.3 mg/dL (ref 8.9–10.3)
Chloride: 97 mmol/L — ABNORMAL LOW (ref 98–111)
Creatinine, Ser: 5.9 mg/dL — ABNORMAL HIGH (ref 0.61–1.24)
GFR, Estimated: 10 mL/min — ABNORMAL LOW (ref >60)
Glucose, Bld: 117 mg/dL — ABNORMAL HIGH (ref 70–99)
Phosphorus: 6.9 mg/dL — ABNORMAL HIGH (ref 2.5–4.6)
Potassium: 3.7 mmol/L (ref 3.5–5.1)
Sodium: 137 mmol/L (ref 135–145)

## 2021-01-26 LAB — CBC
HCT: 29.2 % — ABNORMAL LOW (ref 39.0–52.0)
Hemoglobin: 9.3 g/dL — ABNORMAL LOW (ref 13.0–17.0)
MCH: 28.8 pg (ref 26.0–34.0)
MCHC: 31.8 g/dL (ref 30.0–36.0)
MCV: 90.4 fL (ref 80.0–100.0)
Platelets: 161 10*3/uL (ref 150–400)
RBC: 3.23 MIL/uL — ABNORMAL LOW (ref 4.22–5.81)
RDW: 15.7 % — ABNORMAL HIGH (ref 11.5–15.5)
WBC: 4.8 10*3/uL (ref 4.0–10.5)
nRBC: 0 % (ref 0.0–0.2)

## 2021-01-26 LAB — GLUCOSE, CAPILLARY: Glucose-Capillary: 160 mg/dL — ABNORMAL HIGH (ref 70–99)

## 2021-01-26 LAB — SURGICAL PATHOLOGY

## 2021-01-26 MED ORDER — ASPIRIN EC 81 MG PO TBEC: 81.0000 mg | DELAYED_RELEASE_TABLET | Freq: Every morning | ORAL | 11 refills | Status: DC

## 2021-01-26 NOTE — Telephone Encounter (Signed)
Transition Care Management Follow-Up Telephone Call   Date discharged and where:01/26/2021 [place]  How have you been since you were released from the hospital? Better but doesn't want to schedule a follow up because he is stretched too far with Dr. Thomasene Lot.   Any patient concerns? no  Items Reviewed:   Meds: Y  Allergies:Y  Dietary Changes Reviewed:Y  Functional Questionnaire:  Independent-I Dependent-D  ADLs:I Home Health   Dressing- I    Eating-I   Maintaining continence-I   Transferring-I   Transportation-D   Meal Prep- I   Managing Meds-  I  Confirmed importance and Date/Time of follow-up visits scheduled:Patient does NOT want to schedule an appointment at this time because he is too stretched for appointments all ready. Stated that the Dialysis will draw his blood.     Confirmed with patient if condition worsens to call PCP or go to the Emergency Dept. Patient was given office number and encouraged to call back with questions or concerns: Yes

## 2021-01-26 NOTE — Progress Notes (Signed)
AVS paperwork provided to pt. All questions answered. 

## 2021-01-26 NOTE — Telephone Encounter (Signed)
Incoming call received from patient stating he received a call from [place] today in reference to a referral that was placed on 12/2020. Patient states he told the caller that he have been hospitalized twice since referral was placed for the issues associated with referral. Jacob [name] states the caller hung up on him.  Jacob [name] would also like to inform [name] that he does not feel the need to have follow-up blood work done here (recommended at hospital discharge) as he has blood work checked frequently at dialysis. Patient was asked to have blood work faxed to our office when drawn at dialysis for continuity of care.

## 2021-01-26 NOTE — Discharge Summary (Signed)
PATIENT DETAILS Name: Jacob [name] Age: 70 y.o. Sex: male Date of Birth: 1951-06-15 MRN: [id number]. Admitting Physician: Norval Morton, MD [id number], [name], NP  Admit Date: 01/24/2021 Discharge date: 01/26/2021  Recommendations for Outpatient Follow-up:  1. Follow up with PCP in 1-2 weeks 2. Please obtain CMP/CBC in one week 3. Please follow EGD biopsy results  Admitted From:  Home   Disposition: [place]:  Yes  Equipment/Devices: None  Discharge Condition: Stable  CODE STATUS: FULL CODE  Diet recommendation:  Diet Order            Diet - low sodium heart healthy           Diet Carb Modified           Diet Carb Modified Fluid consistency: Thin; Room service appropriate? Yes  Diet effective now                  Brief Narrative: Patient is a 70 y.o. male with history of ESRD on HD, CAD-most recent PCI February 2021, recurrent GI bleeding due to AVMs (numerous recent admissions), DM-2, HIV, COPD-presented with upper GI bleeding and acute blood loss anemia.  See below for further details.  Significant events: 4/3>> admit for upper GI bleeding and acute blood loss anemia.  Significant studies: 4/3>> chest x-ray: No pneumonia  Antimicrobial therapy: None  Microbiology data: None  Procedures : 4/5>> EGD: Localized severe inflammation in gastric fundus-biopsies pending.  A few angiectasia's with bleeding in the duodenal bulb-s/p APC  Consults: GI, nephrology  Brief Hospital Course: Upper GI bleeding with acute blood loss anemia: Recurrent issue-due to AVMs-underwent EGD on 4/4-treated with APC.  Patient has been very anxious to go home right after EGD-his hemoglobin this morning stable-he is again adamant on leaving the hospital.  Discussed with Dr. Erick Alley MD-no further procedures planned this admission-we will discharge home with PPI.  Hold aspirin for 5 more days.  Please recheck CBC in 1 week.  ESRD: On HD-nephrology  followed closely-resume outpatient hemodialysis at his usual schedule.  HIV (CD4 count 403 September 2021): Continue antiretrovirals  HTN: Stable-continue Coreg and Imdur  CAD: Hold aspirin x5 days-no longer on Brilinta-last PCI February 2021.  On Coreg/Imdur.  Chronic combined systolic and diastolic heart failure (EF 40-45% by echo on 09/2020): Euvolemic-volume removal with HD  COPD: No exacerbation-continue bronchodilators  DM-2 (A1c 4.6 on 12/15/2020):  CBG stable-resume usual insulin regimen on discharge.  Glaucoma: Continue latanoprost and timolol  Peripheral neuropathy: Stable-continue Neurontin  Gout: No flare-continue allopurinol  Discharge Diagnoses:  Principal Problem:   GI bleed Active Problems:   Human immunodeficiency virus (HIV) disease ([place])   Coronary artery disease involving native heart without angina pectoris   HTN (hypertension)   Type 2 diabetes mellitus with diabetic polyneuropathy, with long-term current use of insulin ([facility])   AVM (arteriovenous malformation)   ESRD on hemodialysis ([place])   Acute blood loss anemia   Discharge Instructions:  Activity:  As tolerated  Discharge Instructions    Diet - low sodium heart healthy   Complete by: As directed    Diet Carb Modified   Complete by: As directed    Discharge instructions   Complete by: As directed    Follow with Primary MD  [name], NP in 1-2 weeks  Follow with your dialysis center at the usual schedule  Hold aspirin for 5 more days  Please ask your primary care practitioner or your primary GI  MD to follow-up on endoscopy biopsy results.  Please get a complete blood count and chemistry panel checked by your Primary MD at your next visit, and again as instructed by your Primary MD.  Get Medicines reviewed and adjusted: Please take all your medications with you for your next visit with your Primary MD  Laboratory/radiological data: Please request your Primary MD to go  over all hospital tests and procedure/radiological results at the follow up, please ask your Primary MD to get all Hospital records sent to his/her office.  In some cases, they will be blood work, cultures and biopsy results pending at the time of your discharge. Please request that your primary care M.D. follows up on these results.  Also Note the following: If you experience worsening of your admission symptoms, develop shortness of breath, life threatening emergency, suicidal or homicidal thoughts you must seek medical attention immediately by calling 911 or calling your MD immediately  if symptoms less severe.  You must read complete instructions/literature along with all the possible adverse reactions/side effects for all the Medicines you take and that have been prescribed to you. Take any new Medicines after you have completely understood and accpet all the possible adverse reactions/side effects.   Do not drive when taking Pain medications or sleeping medications (Benzodaizepines)  Do not take more than prescribed Pain, Sleep and Anxiety Medications. It is not advisable to combine anxiety,sleep and pain medications without talking with your primary care practitioner  Special Instructions: If you have smoked or chewed Tobacco  in the last 2 yrs please stop smoking, stop any regular Alcohol  and or any Recreational drug use.  Wear Seat belts while driving.  Please note: You were cared for by a hospitalist during your hospital stay. Once you are discharged, your primary care physician will handle any further medical issues. Please note that NO REFILLS for any discharge medications will be authorized once you are discharged, as it is imperative that you return to your primary care physician (or establish a relationship with a primary care physician if you do not have one) for your post hospital discharge needs so that they can reassess your need for medications and monitor your lab values.    Increase activity slowly   Complete by: As directed    No dressing needed   Complete by: As directed      Allergies as of 01/26/2021      Reactions   Augmentin [amoxicillin-pot Clavulanate] Diarrhea, Other (See Comments)   Severe diarrhea   Mucinex Fast-max Other (See Comments)   Intense sweating    Amphetamines Other (See Comments)   Unknown reaction      Medication List    TAKE these medications   acetaminophen 650 MG CR tablet Commonly known as: TYLENOL Take 1,300 mg by mouth 3 (three) times daily.   allopurinol 100 MG tablet Commonly known as: ZYLOPRIM TAKE 1 TABLET(100 MG) BY MOUTH DAILY What changed:   how much to take  how to take this  when to take this  additional instructions   Anoro Ellipta 62.5-25 MCG/INH Aepb Generic drug: umeclidinium-vilanterol INHALE 1 PUFF BY MOUTH EVERY DAY What changed: See the new instructions.   aspirin EC 81 MG tablet Take 1 tablet (81 mg total) by mouth in the morning. Swallow whole. Start taking on: January 31, 2021 What changed: These instructions start on January 31, 2021. If you are unsure what to do until then, ask your doctor or other care provider.  B-D UF III MINI PEN NEEDLES 31G X 5 MM Misc Generic drug: Insulin Pen Needle USE FOUR TIMES DAILY   Biktarvy 50-200-25 MG Tabs tablet Generic drug: bictegravir-emtricitabine-tenofovir AF Take 1 tablet by mouth daily.   Calcium Acetate 667 MG Tabs Take 667-1,334 mg by mouth See admin instructions. Take 1,334 mg by mouth three times a day with meals and 667 mg with each snack   carvedilol 3.125 MG tablet Commonly known as: COREG Take 1 tablet (3.125 mg total) by mouth 2 (two) times daily.   colchicine 0.6 MG tablet Take 0.6-1.2 mg by mouth daily as needed (as directed for gout flares).   diclofenac Sodium 1 % Gel Commonly known as: VOLTAREN APPLY 2 GRAMS TO THE AFFECTED AREA THREE TIMES DAILY AS NEEDED FOR PAIN What changed:   how much to take  how to take  this  when to take this  reasons to take this  additional instructions   folic acid 1 MG tablet Commonly known as: FOLVITE Take 1 tablet (1 mg total) by mouth daily.   gabapentin 300 MG capsule Commonly known as: NEURONTIN Take 1 capsule (300 mg total) by mouth daily. Dose change due to renal function What changed: additional instructions   insulin lispro 100 UNIT/ML KwikPen Commonly known as: HumaLOG KwikPen Inject 0.60ml (20 Units total) into the skin 3 (three) times daily as needed for high blood sugar (above 150) What changed:   how much to take  how to take this  when to take this  reasons to take this  additional instructions   iron sucrose in sodium chloride 0.9 % 100 mL Iron Sucrose (Venofer)   isosorbide mononitrate 30 MG 24 hr tablet Commonly known as: IMDUR TAKE 1 TABLET BY MOUTH EVERY DAY What changed:   how much to take  how to take this  when to take this  additional instructions   latanoprost 0.005 % ophthalmic solution Commonly known as: XALATAN Place 1 drop into both eyes at bedtime.   lidocaine-prilocaine cream Commonly known as: EMLA Apply 1 application topically every Monday, Wednesday, and Friday with hemodialysis.   MIRCERA IJ Mircera   multivitamin Tabs tablet Take 1 tablet by mouth daily.   nitroGLYCERIN 0.3 MG SL tablet Commonly known as: NITROSTAT ONE TABLET UNDER TONGUE AS NEEDED FOR CHEST PAIN What changed: See the new instructions.   OneTouch Delica Plus [id number] Misc Inject 1 Device as directed 3 (three) times daily. Dx: E11.40   OneTouch Ultra test strip Generic drug: glucose blood CHECK BLOOD SUGAR 3 TIMES  DAILY What changed: See the new instructions.   pantoprazole 40 MG tablet Commonly known as: PROTONIX TAKE 1 TABLET(40 MG) BY MOUTH TWICE DAILY What changed:   how much to take  how to take this  when to take this  additional instructions   polyethylene glycol 17 g packet Commonly known as:  MIRALAX / GLYCOLAX Take 17 g by mouth daily as needed for mild constipation. What changed: reasons to take this   SENSIPAR PO Take 1 tablet by mouth 3 (three) times a week. At dialysis   timolol 0.5 % ophthalmic solution Commonly known as: TIMOPTIC Place 1 drop into both eyes in the morning.   traMADol 50 MG tablet Commonly known as: ULTRAM Take by mouth every 12 (twelve) hours as needed.            Discharge Care Instructions  (From admission, onward)         Start  Ordered   01/26/21 0000  No dressing needed        01/26/21 0845          Allergies  Allergen Reactions  . Augmentin [Amoxicillin-Pot Clavulanate] Diarrhea and Other (See Comments)    Severe diarrhea  . Mucinex Fast-Max Other (See Comments)    Intense sweating   . Amphetamines Other (See Comments)    Unknown reaction     Other Procedures/Studies: DG CHEST PORT 1 VIEW  Result Date: 01/24/2021 CLINICAL DATA:  Shortness of breath. EXAM: PORTABLE CHEST 1 VIEW COMPARISON:  12/11/2020 FINDINGS: Lungs are adequately inflated without focal airspace consolidation or effusion. Subtle hazy prominence of the perihilar markings unchanged likely mild chronic vascular congestion. Moderate stable cardiomegaly. Remainder of the exam is unchanged. IMPRESSION: Moderate stable cardiomegaly with suggestion of minimal chronic vascular congestion. Electronically Signed   By: Marin Olp M.D.   On: 01/24/2021 15:01     TODAY-DAY OF DISCHARGE:  Subjective:   Jacob [name] today has no headache,no chest abdominal pain,no new weakness tingling or numbness, feels much better wants to go home today.   Objective:   Blood pressure (!) 119/53, pulse 76, temperature 98.9 F (37.2 C), temperature source Oral, resp. rate 18, height 6\' 2"  (1.88 m), weight 74.7 kg, SpO2 98 %.  Intake/Output Summary (Last 24 hours) at 01/26/2021 0845 Last data filed at 01/26/2021 0403 Gross per 24 hour  Intake 790 ml  Output 1605 ml  Net  -815 ml   Filed Weights   01/25/21 0725 01/25/21 1140 01/25/21 1222  Weight: 76.4 kg 74.7 kg 74.7 kg    Exam: Awake Alert, Oriented *3, No new F.N deficits, Normal affect [place].AT,PERRAL Supple Neck,No JVD, No cervical lymphadenopathy appriciated.  Symmetrical Chest wall movement, Good air movement bilaterally, CTAB RRR,No Gallops,Rubs or new Murmurs, No Parasternal Heave +ve B.Sounds, Abd Soft, Non tender, No organomegaly appriciated, No rebound -guarding or rigidity. No Cyanosis, Clubbing or edema, No new Rash or bruise   PERTINENT RADIOLOGIC STUDIES: DG CHEST PORT 1 VIEW  Result Date: 01/24/2021 CLINICAL DATA:  Shortness of breath. EXAM: PORTABLE CHEST 1 VIEW COMPARISON:  12/11/2020 FINDINGS: Lungs are adequately inflated without focal airspace consolidation or effusion. Subtle hazy prominence of the perihilar markings unchanged likely mild chronic vascular congestion. Moderate stable cardiomegaly. Remainder of the exam is unchanged. IMPRESSION: Moderate stable cardiomegaly with suggestion of minimal chronic vascular congestion. Electronically Signed   By: Marin Olp M.D.   On: 01/24/2021 15:01     PERTINENT LAB RESULTS: CBC: Recent Labs    01/25/21 0427 01/25/21 1243 01/26/21 0602  WBC 6.4  --  4.8  HGB 8.9* 10.9* 9.3*  HCT 26.5* 32.0* 29.2*  PLT 149*  --  161   CMET CMP     Component Value Date/Time   NA 137 01/26/2021 0602   NA 141 02/05/2020 0000   K 3.7 01/26/2021 0602   CL 97 (L) 01/26/2021 0602   CO2 26 01/26/2021 0602   GLUCOSE 117 (H) 01/26/2021 0602   BUN 37 (H) 01/26/2021 0602   BUN 65 (A) 02/05/2020 0000   CREATININE 5.90 (H) 01/26/2021 0602   CREATININE 5.13 (H) 01/12/2021 1139   CALCIUM 9.3 01/26/2021 0602   PROT 6.5 01/07/2021 1044   PROT 7.8 08/07/2015 1041   ALBUMIN 3.2 (L) 01/26/2021 0602   ALBUMIN 3.8 08/07/2015 1041   AST 21 01/07/2021 1044   ALT 13 01/07/2021 1044   ALKPHOS 86 01/07/2021 1044   BILITOT 0.6 01/07/2021 1044  BILITOT 0.5  08/07/2015 1041   GFRNONAA 10 (L) 01/26/2021 0602   GFRNONAA 10 (L) 01/12/2021 1139   GFRAA 12 (L) 01/12/2021 1139    GFR Estimated Creatinine Clearance: 12 mL/min (A) (by C-G formula based on SCr of 5.9 mg/dL (H)). No results for input(s): LIPASE, AMYLASE in the last 72 hours. No results for input(s): CKTOTAL, CKMB, CKMBINDEX, TROPONINI in the last 72 hours. Invalid input(s): POCBNP No results for input(s): DDIMER in the last 72 hours. No results for input(s): HGBA1C in the last 72 hours. No results for input(s): CHOL, HDL, LDLCALC, TRIG, CHOLHDL, LDLDIRECT in the last 72 hours. No results for input(s): TSH, T4TOTAL, T3FREE, THYROIDAB in the last 72 hours.  Invalid input(s): FREET3 No results for input(s): VITAMINB12, FOLATE, FERRITIN, TIBC, IRON, RETICCTPCT in the last 72 hours. Coags: No results for input(s): INR in the last 72 hours.  Invalid input(s): PT Microbiology: No results found for this or any previous visit (from the past 240 hour(s)).  FURTHER DISCHARGE INSTRUCTIONS:  Get Medicines reviewed and adjusted: Please take all your medications with you for your next visit with your Primary MD  Laboratory/radiological data: Please request your Primary MD to go over all hospital tests and procedure/radiological results at the follow up, please ask your Primary MD to get all Hospital records sent to his/her office.  In some cases, they will be blood work, cultures and biopsy results pending at the time of your discharge. Please request that your primary care M.D. goes through all the records of your hospital data and follows up on these results.  Also Note the following: If you experience worsening of your admission symptoms, develop shortness of breath, life threatening emergency, suicidal or homicidal thoughts you must seek medical attention immediately by calling 911 or calling your MD immediately  if symptoms less severe.  You must read complete instructions/literature  along with all the possible adverse reactions/side effects for all the Medicines you take and that have been prescribed to you. Take any new Medicines after you have completely understood and accpet all the possible adverse reactions/side effects.   Do not drive when taking Pain medications or sleeping medications (Benzodaizepines)  Do not take more than prescribed Pain, Sleep and Anxiety Medications. It is not advisable to combine anxiety,sleep and pain medications without talking with your primary care practitioner  Special Instructions: If you have smoked or chewed Tobacco  in the last 2 yrs please stop smoking, stop any regular Alcohol  and or any Recreational drug use.  Wear Seat belts while driving.  Please note: You were cared for by a hospitalist during your hospital stay. Once you are discharged, your primary care physician will handle any further medical issues. Please note that NO REFILLS for any discharge medications will be authorized once you are discharged, as it is imperative that you return to your primary care physician (or establish a relationship with a primary care physician if you do not have one) for your post hospital discharge needs so that they can reassess your need for medications and monitor your lab values.  Total Time spent coordinating discharge including counseling, education and face to face time equals 35 minutes.  SignedOren Binet 01/26/2021 8:45 AM

## 2021-01-26 NOTE — Telephone Encounter (Signed)
I agree his lab work is being monitored closely at dialysis. Thank you

## 2021-01-26 NOTE — Plan of Care (Signed)

## 2021-01-27 ENCOUNTER — Encounter (HOSPITAL_COMMUNITY): Payer: Self-pay | Admitting: Gastroenterology

## 2021-01-27 ENCOUNTER — Other Ambulatory Visit: Payer: Self-pay

## 2021-01-27 DIAGNOSIS — Z992 Dependence on renal dialysis: Secondary | ICD-10-CM | POA: Diagnosis not present

## 2021-01-27 DIAGNOSIS — N186 End stage renal disease: Secondary | ICD-10-CM | POA: Diagnosis not present

## 2021-01-27 DIAGNOSIS — D509 Iron deficiency anemia, unspecified: Secondary | ICD-10-CM | POA: Diagnosis not present

## 2021-01-27 DIAGNOSIS — D631 Anemia in chronic kidney disease: Secondary | ICD-10-CM | POA: Diagnosis not present

## 2021-01-27 DIAGNOSIS — N2581 Secondary hyperparathyroidism of renal origin: Secondary | ICD-10-CM | POA: Diagnosis not present

## 2021-01-27 MED ORDER — DOXYCYCLINE HYCLATE 100 MG PO CAPS: 100.0000 mg | ORAL_CAPSULE | Freq: Two times a day (BID) | ORAL | 0 refills | Status: AC

## 2021-01-27 MED ORDER — BISMUTH SUBSALICYLATE 262 MG PO TABS: 524.0000 mg | ORAL_TABLET | Freq: Four times a day (QID) | ORAL | 0 refills | Status: DC

## 2021-01-27 MED ORDER — METRONIDAZOLE 250 MG PO TABS: 250.0000 mg | ORAL_TABLET | Freq: Four times a day (QID) | ORAL | 0 refills | Status: AC

## 2021-01-29 LAB — CBC AND DIFFERENTIAL: Hemoglobin: 9.7 — AB (ref 13.5–17.5)

## 2021-02-01 ENCOUNTER — Other Ambulatory Visit: Payer: Self-pay | Admitting: Nurse Practitioner

## 2021-02-01 DIAGNOSIS — N186 End stage renal disease: Secondary | ICD-10-CM | POA: Diagnosis not present

## 2021-02-01 DIAGNOSIS — N2581 Secondary hyperparathyroidism of renal origin: Secondary | ICD-10-CM | POA: Diagnosis not present

## 2021-02-01 DIAGNOSIS — D509 Iron deficiency anemia, unspecified: Secondary | ICD-10-CM | POA: Diagnosis not present

## 2021-02-01 DIAGNOSIS — D631 Anemia in chronic kidney disease: Secondary | ICD-10-CM | POA: Diagnosis not present

## 2021-02-01 DIAGNOSIS — Z992 Dependence on renal dialysis: Secondary | ICD-10-CM | POA: Diagnosis not present

## 2021-02-01 DIAGNOSIS — E1122 Type 2 diabetes mellitus with diabetic chronic kidney disease: Secondary | ICD-10-CM | POA: Diagnosis not present

## 2021-02-02 ENCOUNTER — Ambulatory Visit: Payer: Medicare Other | Admitting: Orthopaedic Surgery

## 2021-02-03 DIAGNOSIS — D509 Iron deficiency anemia, unspecified: Secondary | ICD-10-CM | POA: Diagnosis not present

## 2021-02-03 DIAGNOSIS — D631 Anemia in chronic kidney disease: Secondary | ICD-10-CM | POA: Diagnosis not present

## 2021-02-03 DIAGNOSIS — N2581 Secondary hyperparathyroidism of renal origin: Secondary | ICD-10-CM | POA: Diagnosis not present

## 2021-02-03 DIAGNOSIS — Z992 Dependence on renal dialysis: Secondary | ICD-10-CM | POA: Diagnosis not present

## 2021-02-03 DIAGNOSIS — N186 End stage renal disease: Secondary | ICD-10-CM | POA: Diagnosis not present

## 2021-02-03 DIAGNOSIS — E1122 Type 2 diabetes mellitus with diabetic chronic kidney disease: Secondary | ICD-10-CM | POA: Diagnosis not present

## 2021-02-04 ENCOUNTER — Ambulatory Visit (INDEPENDENT_AMBULATORY_CARE_PROVIDER_SITE_OTHER): Payer: Medicare Other | Admitting: Nurse Practitioner

## 2021-02-04 ENCOUNTER — Encounter: Payer: Self-pay | Admitting: Nurse Practitioner

## 2021-02-04 ENCOUNTER — Other Ambulatory Visit: Payer: Self-pay

## 2021-02-04 VITALS — BP 140/70 | HR 92 | Temp 97.8°F | Ht 74.0 in | Wt 172.2 lb

## 2021-02-04 DIAGNOSIS — I25118 Atherosclerotic heart disease of native coronary artery with other forms of angina pectoris: Secondary | ICD-10-CM

## 2021-02-04 DIAGNOSIS — Z794 Long term (current) use of insulin: Secondary | ICD-10-CM | POA: Diagnosis not present

## 2021-02-04 DIAGNOSIS — I10 Essential (primary) hypertension: Secondary | ICD-10-CM

## 2021-02-04 DIAGNOSIS — E1140 Type 2 diabetes mellitus with diabetic neuropathy, unspecified: Secondary | ICD-10-CM

## 2021-02-04 DIAGNOSIS — D62 Acute posthemorrhagic anemia: Secondary | ICD-10-CM

## 2021-02-04 DIAGNOSIS — K219 Gastro-esophageal reflux disease without esophagitis: Secondary | ICD-10-CM | POA: Diagnosis not present

## 2021-02-04 DIAGNOSIS — I5042 Chronic combined systolic (congestive) and diastolic (congestive) heart failure: Secondary | ICD-10-CM

## 2021-02-04 NOTE — Progress Notes (Signed)
Careteam: Patient Care Team: [name], NP as PCP - General (Geriatric Medicine) [name], MD as PCP - Infectious Diseases (Infectious Diseases) [name], MD as PCP - Cardiology (Cardiology) [name], MD as Consulting Physician (Ophthalmology) [name], MD as Consulting Physician (Cardiology) [name], MD as Consulting Physician (Infectious Diseases) Gardiner Barefoot, DPM as Consulting Physician (Podiatry) [name], Nelda Bucks, NP as Nurse Practitioner (Family Medicine) Kid[place], [place]:  [place] Directive information Does Patient Have a Medical Advance Directive?: Yes, Type of Advance Directive: Healthcare Power of [place];Living will;Out of facility DNR (pink MOST or yellow form), Does patient want to make changes to medical advance directive?: No - Patient declined  Allergies  Allergen Reactions  . Augmentin [Amoxicillin-Pot Clavulanate] Diarrhea and Other (See Comments)    Severe diarrhea  . Mucinex Fast-Max Other (See Comments)    Intense sweating   . Amphetamines Other (See Comments)    Unknown reaction    Chief Complaint  Patient presents with  . [facility] follow up visit. Discuss need for COVID booster (2nd), Eye exam. Patient still having pain on right side.Patient was hospitalized for GI bleed.      HPI: Patient is a 70 y.o. male for hospital follow up.  Pt was found to be very anemic and EGD was done which showed Localized severe inflammation in gastric fundus-biopsies pending.  A few angiectasia's with bleeding in the duodenal bulb-s/p APC Taking protonix twice daily and flagyl with doxycyline.  Abdominal pain somewhat better.  No changes in diet. Reports stools continue to be dark.  Received iron at HD yesterday.  He was instructed to hold ASA for 5 days and  started back taking ASA back yesterday Unsure what hgb was yesterday but was obtained at HD.  Continues on HD  per regular schedule and they obtain labs.   DM- humalog 20 units TID when over 150. Reports rarely low blood sugar. a1c of 4.6 in february     Review of Systems:  Review of Systems  Constitutional: Negative for chills, fever and weight loss.  HENT: Negative for tinnitus.   Respiratory: Negative for cough, sputum production and shortness of breath.   Cardiovascular: Negative for chest pain, palpitations and leg swelling.  Gastrointestinal: Positive for abdominal pain (has improved). Negative for constipation, diarrhea and heartburn.  Genitourinary: Negative for dysuria, frequency and urgency.  Musculoskeletal: Negative for back pain, falls, joint pain and myalgias.  Skin: Negative.   Neurological: Negative for dizziness and headaches.  Psychiatric/Behavioral: Negative for depression and memory loss. The patient does not have insomnia.     Past Medical History:  Diagnosis Date  . Acute respiratory failure ([place]) 03/01/2018  . Arthritis    "all over; mostly knees and back" (02/28/2018)  . Chronic lower back pain    stenosis  . Community acquired pneumonia 09/06/2013  . COPD (chronic obstructive pulmonary disease) ([place])   . Coronary atherosclerosis of native coronary artery    a. 02/2003 s/p CABG x 2 (VG->RI, VG->RPDA; b. 11/2019 PCI: LM nl, LAD 90d, D3 50, RI 100, LCX 100p, OM3 100 - fills via L->L collats from D2/dLAD, RCA 100p, VG->RPDA ok, VG->RI 95 (3.5x48 Synergy XD DES).  . Drug abuse ([place])    hx; tested for cocaine as recently as 2/08. says he is not using drugs now - avoided defib. for this reason   . ESRD (end stage renal disease) ([place])  Hemo M-W-F- Richarda Blade  . Fall at home 10/2020  . GERD (gastroesophageal reflux disease)    takes OTC meds as needed  . GI bleeding    a. 11/2019 EGD: angiodysplastic lesions w/ bleeding s/p argon plasma/clipping/epi inj.  . Glaucoma    uses eye drops daily  . Hepatitis B 1968   "tx'd w/isolation; caught it from toilet stools in gym"  .  HFrEF (heart failure with reduced ejection fraction) ([place])    a. 01/2019 Echo: EF 40-45%, diffuse HK, mild basal septal hypertrophy.  . History of blood transfusion 03/01/2019  . History of colon polyps    benign  . History of gout    takes Allopurinol daily as well as Colchicine-if needed (02/28/2018)  . History of kidney stones   . HTN (hypertension)    takes Coreg,Imdur.and Apresoline daily  . Human immunodeficiency virus (HIV) disease ([place]) dx'd 11   on [place] as of 12/2020.    [name] Hyperlipidemia    takes Atorvastatin daily  . Ischemic cardiomyopathy    a. 01/2019 Echo: EF 40-45%, diffuse HK, mild basal septal hypertrophy. Diast dysfxn. Nl RV size/fxn. Sev dil LA. Triv MR/TR/PR.  [name] Muscle spasm    takes Zanaflex as needed  . Myocardial infarction ([place]) ~ 2004/2005  . Nocturia   . Peripheral neuropathy    takes gabapentin daily  . Pneumonia    "at least twice" (02/28/2018)  . Syphilis, unspecified   . Type II diabetes mellitus ([place]) 2004   Lantus daily.Average fasting blood sugar 125-199  . Wears glasses   . Wears partial dentures    Past Surgical History:  Procedure Laterality Date  . AV FISTULA PLACEMENT Left 08/02/2018   Procedure: ARTERIOVENOUS (AV) FISTULA CREATION  left arm radiocephlic;  Surgeon: Marty Heck, MD;  Location: [place];  Service: Vascular;  Laterality: Left;  . AV FISTULA PLACEMENT Left 08/01/2019   Procedure: LEFT BRACHIOCEPHALIC ARTERIOVENOUS (AV) FISTULA CREATION;  Surgeon: [name], MD;  Location: [place];  Service: Vascular;  Laterality: Left;  . BASCILIC VEIN TRANSPOSITION Left 10/03/2019   Procedure: BASILIC VEIN TRANSPOSITION LEFT SECOND STAGE;  Surgeon: [name], MD;  Location: [place];  Service: Vascular;  Laterality: Left;  . BIOPSY  01/25/2021   Procedure: BIOPSY;  Surgeon: Doran Stabler, MD;  Location: [facility] ENDOSCOPY;  Service: Gastroenterology;;  . CARDIAC CATHETERIZATION  10/2002; 12/19/2004   [name] 03/08/2011  . COLONOSCOPY  2013    Dr.John Henrene Pastor   . CORONARY ARTERY BYPASS GRAFT  02/24/2003   CABG X2/notes 03/08/2011  . CORONARY STENT INTERVENTION N/A 12/19/2019   Procedure: CORONARY STENT INTERVENTION;  Surgeon: Jettie Booze, MD;  Location: [place] CV LAB;  Service: Cardiovascular;  Laterality: N/A;  . ENTEROSCOPY N/A 01/25/2021   Procedure: ENTEROSCOPY;  Surgeon: Doran Stabler, MD;  Location: [place];  Service: Gastroenterology;  Laterality: N/A;  . ESOPHAGOGASTRODUODENOSCOPY (EGD) WITH PROPOFOL N/A 02/08/2019   Procedure: ESOPHAGOGASTRODUODENOSCOPY (EGD) WITH PROPOFOL;  Surgeon: Milus Banister, MD;  Location: [place];  Service: Gastroenterology;  Laterality: N/A;  . ESOPHAGOGASTRODUODENOSCOPY (EGD) WITH PROPOFOL N/A 12/22/2019   Procedure: ESOPHAGOGASTRODUODENOSCOPY (EGD) WITH PROPOFOL;  Surgeon: Lavena Bullion, DO;  Location: [place];  Service: Gastroenterology;  Laterality: N/A;  . ESOPHAGOGASTRODUODENOSCOPY (EGD) WITH PROPOFOL N/A 10/19/2020   Procedure: ESOPHAGOGASTRODUODENOSCOPY (EGD) WITH PROPOFOL;  Surgeon: Jackquline Denmark, MD;  Location: [facility] ENDOSCOPY;  Service: Endoscopy;  Laterality: N/A;  . ESOPHAGOGASTRODUODENOSCOPY (EGD) WITH PROPOFOL N/A 12/22/2020   Procedure:  ESOPHAGOGASTRODUODENOSCOPY (EGD) WITH PROPOFOL;  Surgeon: Gatha Mayer, MD;  Location[facility]COPY;  Service: Endoscopy;  Laterality: N/A;  . ESOPHAGOGASTRODUODENOSCOPY (EGD) WITH PROPOFOL N/A 01/09/2021   Procedure: ESOPHAGOGASTRODUODENOSCOPY (EGD) WITH PROPOFOL;  Surgeon: Irene Shipper, MD;  Location: [facility] ENDOSCOPY;  Service: Endoscopy;  Laterality: N/A;  . HEMOSTASIS CLIP PLACEMENT  12/22/2019   Procedure: HEMOSTASIS CLIP PLACEMENT;  Surgeon: Lavena Bullion, DO;  Location: [place] ENDOSCOPY;  Service: Gastroenterology;;  . HEMOSTASIS CLIP PLACEMENT  12/22/2020   Procedure: HEMOSTASIS CLIP PLACEMENT;  Surgeon: Gatha Mayer, MD;  Location: [facility] ENDOSCOPY;  Service: Endoscopy;;  . HEMOSTASIS CONTROL  12/22/2020    Procedure: HEMOSTASIS CONTROL/hemospray;  Surgeon: Gatha Mayer, MD;  Location: [place];  Service: Endoscopy;;  . HOT HEMOSTASIS N/A 02/08/2019   Procedure: HOT HEMOSTASIS (ARGON PLASMA COAGULATION/BICAP);  Surgeon: Milus Banister, MD;  Location: [facility] ENDOSCOPY;  Service: Gastroenterology;  Laterality: N/A;  . HOT HEMOSTASIS N/A 12/22/2019   Procedure: HOT HEMOSTASIS (ARGON PLASMA COAGULATION/BICAP);  Surgeon: Lavena Bullion, DO;  Location: [facility] ENDOSCOPY;  Service: Gastroenterology;  Laterality: N/A;  . HOT HEMOSTASIS N/A 10/19/2020   Procedure: HOT HEMOSTASIS (ARGON PLASMA COAGULATION/BICAP);  Surgeon: Jackquline Denmark, MD;  Location: [facility] ENDOSCOPY;  Service: Endoscopy;  Laterality: N/A;  . HOT HEMOSTASIS N/A 12/22/2020   Procedure: HOT HEMOSTASIS (ARGON PLASMA COAGULATION/BICAP);  Surgeon: Gatha Mayer, MD;  Location: [facility] ENDOSCOPY;  Service: Endoscopy;  Laterality: N/A;  . HOT HEMOSTASIS N/A 01/09/2021   Procedure: HOT HEMOSTASIS (ARGON PLASMA COAGULATION/BICAP);  Surgeon: Irene Shipper, MD;  Location: [facility] ENDOSCOPY;  Service: Endoscopy;  Laterality: N/A;  . HOT HEMOSTASIS N/A 01/25/2021   Procedure: HOT HEMOSTASIS (ARGON PLASMA COAGULATION/BICAP);  Surgeon: Doran Stabler, MD;  Location: [place];  Service: Gastroenterology;  Laterality: N/A;  . INTERTROCHANTERIC HIP FRACTURE SURGERY Left 11/2006   [name] 03/08/2011  . INTRAVASCULAR ULTRASOUND/IVUS N/A 12/19/2019   Procedure: Intravascular Ultrasound/IVUS;  Surgeon: Jettie Booze, MD;  Location: [place] CV LAB;  Service: Cardiovascular;  Laterality: N/A;  . IR FLUORO GUIDE CV LINE RIGHT  07/24/2019  . IR FLUORO GUIDE CV LINE RIGHT  07/30/2019  . IR US GUIDE VASC ACCESS RIGHT  07/24/2019  . IR US GUIDE VASC ACCESS RIGHT  07/30/2019  . LAPAROSCOPIC CHOLECYSTECTOMY  05/2006  . LIGATION OF COMPETING BRANCHES OF ARTERIOVENOUS FISTULA Left 11/05/2018   Procedure: LIGATION OF COMPETING BRANCHES OF ARTERIOVENOUS FISTULA  LEFT  ARM;   Surgeon: Marty Heck, MD;  Location: [place];  Service: Vascular;  Laterality: Left;  . LUMBAR LAMINECTOMY/DECOMPRESSION MICRODISCECTOMY N/A 02/29/2016   Procedure: Left L4-5 Lateral Recess Decompression, Removal Extradural Intraspinal Facet Cyst;  Surgeon: Marybelle Killings, MD;  Location: [place];  Service: Orthopedics;  Laterality: N/A;  . MULTIPLE TOOTH EXTRACTIONS    . ORIF MANDIBULAR FRACTURE Left 08/13/2004   ORIF of left body fracture mandible with KLS Martin 2.3-mm six hole/notes 03/08/2011  . RIGHT/LEFT HEART CATH AND CORONARY/GRAFT ANGIOGRAPHY N/A 12/19/2019   Procedure: RIGHT/LEFT HEART CATH AND CORONARY/GRAFT ANGIOGRAPHY;  Surgeon: Jettie Booze, MD;  Location: [place] CV LAB;  Service: Cardiovascular;  Laterality: N/A;  . SCLEROTHERAPY  12/22/2019   Procedure: SCLEROTHERAPY;  Surgeon: Lavena Bullion, DO;  Location: [facility] ENDOSCOPY;  Service: Gastroenterology;;   Social History:   reports that he has been smoking cigarettes. He has a 21.50 pack-year smoking history. He has never used smokeless tobacco. He reports previous alcohol use of about 12.0 standard drinks of alcohol per  week. He reports previous drug use. Drug: Cocaine.  Family History  Problem Relation Age of Onset  . Heart failure Father   . Hypertension Father   . Diabetes Brother   . Heart attack Brother   . Alzheimer's disease Mother   . Stroke Sister   . Diabetes Sister   . Alzheimer's disease Sister   . Hypertension Brother   . Diabetes Brother   . Drug abuse Brother   . Colon cancer Neg Hx     Medications: Patient's Medications  New Prescriptions   No medications on file  Previous Medications   ACETAMINOPHEN (TYLENOL) 650 MG CR TABLET    Take 1,300 mg by mouth 3 (three) times daily.   ALLOPURINOL (ZYLOPRIM) 100 MG TABLET    TAKE 1 TABLET(100 MG) BY MOUTH DAILY   ANORO ELLIPTA 62.5-25 MCG/INH AEPB    INHALE 1 PUFF BY MOUTH EVERY DAY   ASPIRIN EC 81 MG TABLET    Take 1 tablet (81 mg total) by  mouth in the morning. Swallow whole.   B-D UF III MINI PEN NEEDLES 31G X 5 MM MISC    USE FOUR TIMES DAILY   BICTEGRAVIR-EMTRICITABINE-TENOFOVIR AF (BIKTARVY) 50-200-25 MG TABS TABLET    Take 1 tablet by mouth daily.   CALCIUM ACETATE 667 MG TABS    Take 667-1,334 mg by mouth See admin instructions. Take 1,334 mg by mouth three times a day with meals and 667 mg with each snack   CARVEDILOL (COREG) 3.125 MG TABLET    Take 1 tablet (3.125 mg total) by mouth 2 (two) times daily.   CINACALCET HCL (SENSIPAR PO)    Take 1 tablet by mouth 3 (three) times a week. At dialysis   COLCHICINE 0.6 MG TABLET    Take 0.6-1.2 mg by mouth daily as needed (as directed for gout flares).   DICLOFENAC SODIUM (VOLTAREN) 1 % GEL    APPLY 2 GRAMS TO THE AFFECTED AREA THREE TIMES DAILY AS NEEDED FOR PAIN   DOXYCYCLINE (VIBRAMYCIN) 100 MG CAPSULE    Take 1 capsule (100 mg total) by mouth 2 (two) times daily for 10 days.   FOLIC ACID (FOLVITE) 1 MG TABLET    Take 1 tablet (1 mg total) by mouth daily.   GABAPENTIN (NEURONTIN) 300 MG CAPSULE    Take 1 capsule (300 mg total) by mouth daily. Dose change due to renal function   INSULIN LISPRO (HUMALOG) 100 UNIT/ML [id number]    INJECT 20 UNITS UNDER THE SKIN THREE TIMES DAILY AS NEEDED FOR HIGH BLOOD SUGAR(ABOVE 150)   IRON SUCROSE IN SODIUM CHLORIDE 0.9 % 100 ML    Iron Sucrose (Venofer)   ISOSORBIDE MONONITRATE (IMDUR) 30 MG 24 HR TABLET    TAKE 1 TABLET BY MOUTH EVERY DAY   LANCETS (ONETOUCH DELICA PLUS [id number]) MISC    Inject 1 Device as directed 3 (three) times daily. Dx: E11.40   LATANOPROST (XALATAN) 0.005 % OPHTHALMIC SOLUTION    Place 1 drop into both eyes at bedtime.   LIDOCAINE-PRILOCAINE (EMLA) CREAM    Apply 1 application topically every Monday, Wednesday, and Friday with hemodialysis.   METHOXY PEG-EPOETIN BETA (MIRCERA IJ)    Mircera   METRONIDAZOLE (FLAGYL) 250 MG TABLET    Take 1 tablet (250 mg total) by mouth 4 (four) times daily for 10 days.   MULTIVITAMIN  (RENA-VIT) TABS TABLET    Take 1 tablet by mouth daily.   NITROGLYCERIN (NITROSTAT) 0.3 MG SL TABLET  ONE TABLET UNDER TONGUE AS NEEDED FOR CHEST PAIN   ONETOUCH ULTRA TEST STRIP    CHECK BLOOD SUGAR 3 TIMES  DAILY   PANTOPRAZOLE (PROTONIX) 40 MG TABLET    TAKE 1 TABLET(40 MG) BY MOUTH TWICE DAILY   POLYETHYLENE GLYCOL (MIRALAX / GLYCOLAX) 17 G PACKET    Take 17 g by mouth daily as needed for mild constipation.   TIMOLOL (TIMOPTIC) 0.5 % OPHTHALMIC SOLUTION    Place 1 drop into both eyes in the morning.  Modified Medications   No medications on file  Discontinued Medications   BISMUTH SUBSALICYLATE 737 MG TABS    Take 2 tablets (524 mg total) by mouth in the morning, at noon, in the evening, and at bedtime for 10 days.   TRAMADOL (ULTRAM) 50 MG TABLET    Take by mouth every 12 (twelve) hours as needed.    Physical Exam:  Vitals:   02/04/21 1022  BP: 140/70  Pulse: 92  Temp: 97.8 F (36.6 C)  TempSrc: Temporal  SpO2: 97%  Weight: 172 lb 3.2 oz (78.1 kg)  Height: 6\' 2"  (1.88 m)   Body mass index is 22.11 kg/m. Wt Readings from Last 3 Encounters:  02/04/21 172 lb 3.2 oz (78.1 kg)  01/25/21 164 lb 10.9 oz (74.7 kg)  01/12/21 173 lb 6.4 oz (78.7 kg)    Physical Exam Constitutional:      General: He is not in acute distress.    Appearance: He is well-developed. He is not diaphoretic.  HENT:     Head: Normocephalic and atraumatic.     Mouth/Throat:     Pharynx: No oropharyngeal exudate.  Eyes:     Conjunctiva/sclera: Conjunctivae normal.     Pupils: Pupils are equal, round, and reactive to light.  Cardiovascular:     Rate and Rhythm: Normal rate and regular rhythm.     Heart sounds: Normal heart sounds.  Pulmonary:     Effort: Pulmonary effort is normal.     Comments: diminished  Abdominal:     General: Bowel sounds are normal. There is no distension.     Palpations: Abdomen is soft.     Tenderness: There is no abdominal tenderness. There is no guarding.   Musculoskeletal:        General: No tenderness.     Cervical back: Normal range of motion and neck supple.  Skin:    General: Skin is warm and dry.  Neurological:     Mental Status: He is alert and oriented to person, place, and time.    Labs reviewed: Basic Metabolic Panel: Recent Labs    10/17/20 0139 10/17/20 1401 11/25/20 0637 12/11/20 1239 12/15/20 1105 12/21/20 0834 12/23/20 0125 12/24/20 0240 01/08/21 1106 01/08/21 1335 01/09/21 0257 01/12/21 1139 01/24/21 1013 01/25/21 0427 01/25/21 1243 01/26/21 0602  NA  --    < > 142   < > 138   < > 143   < >  --    < > 136   < > 137 137 138 137  K  --    < > 4.1   < > 4.0   < > 3.6   < >  --    < > 3.6   < > 4.9 4.5 3.7 3.7  CL  --    < > 100   < > 95*   < > 106   < >  --    < > 99   < > 95* 94*  99 97*  CO2  --    < > 28   < > 29   < > 23   < >  --    < > 30   < > 26 25  --  26  GLUCOSE  --    < > 127*   < > 112*   < > 110*   < >  --    < > 121*   < > 156* 109* 115* 117*  BUN  --    < > 105*   < > 51*   < > 107*   < >  --    < > 31*   < > 75* 83* 26* 37*  CREATININE 8.61*   < > 6.84*   < > 5.77*   < > 5.75*   < >  --    < > 3.75*   < > 8.39* 8.56* 3.60* 5.90*  CALCIUM  --    < > 7.7*   < > 8.7   < > 8.4*   < >  --    < > 8.2*   < > 8.9 9.1  --  9.3  MG  --    < > 2.1  --   --   --  1.9  --  2.2  --   --   --   --   --   --   --   PHOS  --    < > 5.3*  --   --   --   --   --   --    < > 4.9*  --   --  10.9*  --  6.9*  TSH 0.748  --   --   --  0.49  --   --   --   --   --   --   --   --   --   --   --    < > = values in this interval not displayed.   Liver Function Tests: Recent Labs    12/11/20 1239 12/15/20 1105 12/21/20 0834 01/07/21 1044 01/08/21 1335 01/25/21 0427 01/26/21 0602  AST 20 12 16 21   --   --   --   ALT 14 7* 9 13  --   --   --   ALKPHOS 113  --  79 86  --   --   --   BILITOT 0.7 0.3 0.8 0.6  --   --   --   PROT 8.1 7.1 6.8 6.5  --   --   --   ALBUMIN 3.4*  --  2.8* 3.1* 2.7* 3.2* 3.2*   Recent  Labs    10/16/20 1807 12/21/20 0834  LIPASE 70* 73*   No results for input(s): AMMONIA in the last 8760 hours. CBC: Recent Labs    12/21/20 0834 12/21/20 2052 01/12/21 1139 01/24/21 1013 01/25/21 0427 01/25/21 1243 01/26/21 0602  WBC 7.5   < > 4.9 4.9 6.4  --  4.8  NEUTROABS 5.8  --  2,778 3.1  --   --   --   HGB 6.2*   < > 9.0* 6.7* 8.9* 10.9* 9.3*  HCT 20.6*   < > 28.9* 22.1* 26.5* 32.0* 29.2*  MCV 103.0*   < > 94.4 92.1 86.0  --  90.4  PLT 168   < > 150 178 149*  --  161   < > = values in  this interval not displayed.   Lipid Panel: Recent Labs    12/15/20 1105  CHOL 143  HDL 31*  LDLCALC 79  TRIG 248*  CHOLHDL 4.6   TSH: Recent Labs    10/17/20 0139 12/15/20 1105  TSH 0.748 0.49   A1C: Lab Results  Component Value Date   HGBA1C 4.6 12/15/2020     Assessment/Plan 1. Acute blood loss anemia hgb per HD was 9.7, had iron infusion. Continues to be monitored at HD. No signs of bleeding at this time.   2. Chronic combined systolic and diastolic congestive heart failure (HCC) Stable at this time. Continue HD Monday, Wednesday and Friday.  3. Type 2 diabetes mellitus with diabetic neuropathy, with long-term current use of insulin (HCC) A1c at goal. He is using humalog 20 units TID PRN blood sugar over 150. No hypoglycemic episodes. Encouraged dietary compliance, routine foot care/monitoring and to keep up with diabetic eye exams through ophthalmology    4. Essential hypertension - controlled on coreg and continues HD  5. Gastroesophageal reflux disease without esophagitis Stable, continues on Protonix 40 mg BID.   6. Coronary artery disease of native artery of native heart with stable angina pectoris (HCC) Stable. Has restarted ASA at this time.   Next appt: 06/15/2021  [name]. [place], [facility] [phone]

## 2021-02-05 DIAGNOSIS — Z992 Dependence on renal dialysis: Secondary | ICD-10-CM | POA: Diagnosis not present

## 2021-02-05 DIAGNOSIS — N2581 Secondary hyperparathyroidism of renal origin: Secondary | ICD-10-CM | POA: Diagnosis not present

## 2021-02-05 DIAGNOSIS — N186 End stage renal disease: Secondary | ICD-10-CM | POA: Diagnosis not present

## 2021-02-05 DIAGNOSIS — D509 Iron deficiency anemia, unspecified: Secondary | ICD-10-CM | POA: Diagnosis not present

## 2021-02-05 DIAGNOSIS — E1122 Type 2 diabetes mellitus with diabetic chronic kidney disease: Secondary | ICD-10-CM | POA: Diagnosis not present

## 2021-02-05 DIAGNOSIS — D631 Anemia in chronic kidney disease: Secondary | ICD-10-CM | POA: Diagnosis not present

## 2021-02-08 DIAGNOSIS — N2581 Secondary hyperparathyroidism of renal origin: Secondary | ICD-10-CM | POA: Diagnosis not present

## 2021-02-08 DIAGNOSIS — Z992 Dependence on renal dialysis: Secondary | ICD-10-CM | POA: Diagnosis not present

## 2021-02-08 DIAGNOSIS — N186 End stage renal disease: Secondary | ICD-10-CM | POA: Diagnosis not present

## 2021-02-08 DIAGNOSIS — D631 Anemia in chronic kidney disease: Secondary | ICD-10-CM | POA: Diagnosis not present

## 2021-02-09 ENCOUNTER — Telehealth: Payer: Self-pay | Admitting: Internal Medicine

## 2021-02-09 NOTE — Telephone Encounter (Signed)
Attempted to call pt back but got fast busy signal. Pt is supposed to do urea breath test 4 weeks after finishing the antibiotics and we will contact him when it is time to do this test. Will try again.

## 2021-02-10 DIAGNOSIS — N2581 Secondary hyperparathyroidism of renal origin: Secondary | ICD-10-CM | POA: Diagnosis not present

## 2021-02-10 DIAGNOSIS — Z992 Dependence on renal dialysis: Secondary | ICD-10-CM | POA: Diagnosis not present

## 2021-02-10 DIAGNOSIS — N186 End stage renal disease: Secondary | ICD-10-CM | POA: Diagnosis not present

## 2021-02-10 DIAGNOSIS — D631 Anemia in chronic kidney disease: Secondary | ICD-10-CM | POA: Diagnosis not present

## 2021-02-11 NOTE — Telephone Encounter (Signed)
Pt called stating that he finished antibiotics last Monday. He is available to have breath test on Tuesdays or Thursdays due to having dialysis the other days. Pls call him at [phone].

## 2021-02-11 NOTE — Telephone Encounter (Signed)
Spoke with pt and let him know he has to be off antibiotics for 8 weeks. We will let him know when it is time, mail him the order, and [facility] day [facility] wants to [facility]. He is aware.

## 2021-02-12 ENCOUNTER — Encounter (HOSPITAL_COMMUNITY): Payer: Self-pay | Admitting: *Deleted

## 2021-02-12 ENCOUNTER — Inpatient Hospital Stay (HOSPITAL_COMMUNITY)
Admission: EM | Admit: 2021-02-12 | Discharge: 2021-02-16 | DRG: 377 | Disposition: A | Discharge status: Home / Self Care | Payer: Medicare Other | Attending: Internal Medicine | Admitting: Internal Medicine

## 2021-02-12 ENCOUNTER — Other Ambulatory Visit: Payer: Self-pay

## 2021-02-12 DIAGNOSIS — N186 End stage renal disease: Secondary | ICD-10-CM | POA: Diagnosis not present

## 2021-02-12 DIAGNOSIS — Z79899 Other long term (current) drug therapy: Secondary | ICD-10-CM

## 2021-02-12 DIAGNOSIS — R6889 Other general symptoms and signs: Secondary | ICD-10-CM | POA: Diagnosis not present

## 2021-02-12 DIAGNOSIS — Z20822 Contact with and (suspected) exposure to covid-19: Secondary | ICD-10-CM | POA: Diagnosis not present

## 2021-02-12 DIAGNOSIS — D62 Acute posthemorrhagic anemia: Secondary | ICD-10-CM | POA: Diagnosis present

## 2021-02-12 DIAGNOSIS — K219 Gastro-esophageal reflux disease without esophagitis: Secondary | ICD-10-CM | POA: Diagnosis not present

## 2021-02-12 DIAGNOSIS — E1122 Type 2 diabetes mellitus with diabetic chronic kidney disease: Secondary | ICD-10-CM | POA: Diagnosis present

## 2021-02-12 DIAGNOSIS — I10 Essential (primary) hypertension: Secondary | ICD-10-CM | POA: Diagnosis present

## 2021-02-12 DIAGNOSIS — K922 Gastrointestinal hemorrhage, unspecified: Secondary | ICD-10-CM

## 2021-02-12 DIAGNOSIS — Z992 Dependence on renal dialysis: Secondary | ICD-10-CM | POA: Diagnosis not present

## 2021-02-12 DIAGNOSIS — Z951 Presence of aortocoronary bypass graft: Secondary | ICD-10-CM

## 2021-02-12 DIAGNOSIS — F17200 Nicotine dependence, unspecified, uncomplicated: Secondary | ICD-10-CM | POA: Diagnosis present

## 2021-02-12 DIAGNOSIS — F17210 Nicotine dependence, cigarettes, uncomplicated: Secondary | ICD-10-CM | POA: Diagnosis present

## 2021-02-12 DIAGNOSIS — I252 Old myocardial infarction: Secondary | ICD-10-CM | POA: Diagnosis not present

## 2021-02-12 DIAGNOSIS — K31811 Angiodysplasia of stomach and duodenum with bleeding: Principal | ICD-10-CM | POA: Diagnosis present

## 2021-02-12 DIAGNOSIS — N2581 Secondary hyperparathyroidism of renal origin: Secondary | ICD-10-CM | POA: Diagnosis not present

## 2021-02-12 DIAGNOSIS — D649 Anemia, unspecified: Secondary | ICD-10-CM

## 2021-02-12 DIAGNOSIS — I120 Hypertensive chronic kidney disease with stage 5 chronic kidney disease or end stage renal disease: Secondary | ICD-10-CM | POA: Diagnosis not present

## 2021-02-12 DIAGNOSIS — Z8249 Family history of ischemic heart disease and other diseases of the circulatory system: Secondary | ICD-10-CM

## 2021-02-12 DIAGNOSIS — F1410 Cocaine abuse, uncomplicated: Secondary | ICD-10-CM | POA: Diagnosis present

## 2021-02-12 DIAGNOSIS — I2510 Atherosclerotic heart disease of native coronary artery without angina pectoris: Secondary | ICD-10-CM | POA: Diagnosis present

## 2021-02-12 DIAGNOSIS — K5521 Angiodysplasia of colon with hemorrhage: Secondary | ICD-10-CM

## 2021-02-12 DIAGNOSIS — R109 Unspecified abdominal pain: Secondary | ICD-10-CM | POA: Diagnosis not present

## 2021-02-12 DIAGNOSIS — Z888 Allergy status to other drugs, medicaments and biological substances status: Secondary | ICD-10-CM | POA: Diagnosis not present

## 2021-02-12 DIAGNOSIS — I132 Hypertensive heart and chronic kidney disease with heart failure and with stage 5 chronic kidney disease, or end stage renal disease: Secondary | ICD-10-CM | POA: Diagnosis not present

## 2021-02-12 DIAGNOSIS — K921 Melena: Secondary | ICD-10-CM | POA: Diagnosis not present

## 2021-02-12 DIAGNOSIS — E782 Mixed hyperlipidemia: Secondary | ICD-10-CM | POA: Diagnosis not present

## 2021-02-12 DIAGNOSIS — I5043 Acute on chronic combined systolic (congestive) and diastolic (congestive) heart failure: Secondary | ICD-10-CM | POA: Diagnosis present

## 2021-02-12 DIAGNOSIS — Z823 Family history of stroke: Secondary | ICD-10-CM

## 2021-02-12 DIAGNOSIS — Z7982 Long term (current) use of aspirin: Secondary | ICD-10-CM

## 2021-02-12 DIAGNOSIS — B20 Human immunodeficiency virus [HIV] disease: Secondary | ICD-10-CM

## 2021-02-12 DIAGNOSIS — D696 Thrombocytopenia, unspecified: Secondary | ICD-10-CM | POA: Diagnosis not present

## 2021-02-12 DIAGNOSIS — Z794 Long term (current) use of insulin: Secondary | ICD-10-CM

## 2021-02-12 DIAGNOSIS — G8929 Other chronic pain: Secondary | ICD-10-CM | POA: Diagnosis present

## 2021-02-12 DIAGNOSIS — D631 Anemia in chronic kidney disease: Secondary | ICD-10-CM | POA: Diagnosis not present

## 2021-02-12 DIAGNOSIS — Z743 Need for continuous supervision: Secondary | ICD-10-CM | POA: Diagnosis not present

## 2021-02-12 DIAGNOSIS — Z833 Family history of diabetes mellitus: Secondary | ICD-10-CM | POA: Diagnosis not present

## 2021-02-12 DIAGNOSIS — Z981 Arthrodesis status: Secondary | ICD-10-CM

## 2021-02-12 DIAGNOSIS — E1142 Type 2 diabetes mellitus with diabetic polyneuropathy: Secondary | ICD-10-CM

## 2021-02-12 DIAGNOSIS — Z881 Allergy status to other antibiotic agents status: Secondary | ICD-10-CM

## 2021-02-12 DIAGNOSIS — E875 Hyperkalemia: Secondary | ICD-10-CM | POA: Diagnosis not present

## 2021-02-12 DIAGNOSIS — I4891 Unspecified atrial fibrillation: Secondary | ICD-10-CM | POA: Diagnosis not present

## 2021-02-12 DIAGNOSIS — J449 Chronic obstructive pulmonary disease, unspecified: Secondary | ICD-10-CM | POA: Diagnosis present

## 2021-02-12 DIAGNOSIS — I5042 Chronic combined systolic (congestive) and diastolic (congestive) heart failure: Secondary | ICD-10-CM | POA: Diagnosis present

## 2021-02-12 DIAGNOSIS — Z820 Family history of epilepsy and other diseases of the nervous system: Secondary | ICD-10-CM

## 2021-02-12 DIAGNOSIS — E8779 Other fluid overload: Secondary | ICD-10-CM | POA: Diagnosis not present

## 2021-02-12 DIAGNOSIS — N250 Renal osteodystrophy: Secondary | ICD-10-CM | POA: Diagnosis not present

## 2021-02-12 LAB — COMPREHENSIVE METABOLIC PANEL
ALT: 12 U/L (ref 0–44)
AST: 16 U/L (ref 15–41)
Albumin: 2.9 g/dL — ABNORMAL LOW (ref 3.5–5.0)
Alkaline Phosphatase: 64 U/L (ref 38–126)
Anion gap: 12 (ref 5–15)
BUN: 77 mg/dL — ABNORMAL HIGH (ref 8–23)
CO2: 28 mmol/L (ref 22–32)
Calcium: 8.8 mg/dL — ABNORMAL LOW (ref 8.9–10.3)
Chloride: 99 mmol/L (ref 98–111)
Creatinine, Ser: 6.76 mg/dL — ABNORMAL HIGH (ref 0.61–1.24)
GFR, Estimated: 8 mL/min — ABNORMAL LOW (ref >60)
Glucose, Bld: 124 mg/dL — ABNORMAL HIGH (ref 70–99)
Potassium: 4.7 mmol/L (ref 3.5–5.1)
Sodium: 139 mmol/L (ref 135–145)
Total Bilirubin: 0.5 mg/dL (ref 0.3–1.2)
Total Protein: 6.3 g/dL — ABNORMAL LOW (ref 6.5–8.1)

## 2021-02-12 LAB — CBC WITH DIFFERENTIAL/PLATELET
Abs Immature Granulocytes: 0.02 10*3/uL (ref 0.00–0.07)
Basophils Absolute: 0 10*3/uL (ref 0.0–0.1)
Basophils Relative: 1 %
Eosinophils Absolute: 0.1 10*3/uL (ref 0.0–0.5)
Eosinophils Relative: 2 %
HCT: 20 % — ABNORMAL LOW (ref 39.0–52.0)
Hemoglobin: 5.9 g/dL — CL (ref 13.0–17.0)
Immature Granulocytes: 0 %
Lymphocytes Relative: 20 %
Lymphs Abs: 1.2 10*3/uL (ref 0.7–4.0)
MCH: 28.9 pg (ref 26.0–34.0)
MCHC: 29.5 g/dL — ABNORMAL LOW (ref 30.0–36.0)
MCV: 98 fL (ref 80.0–100.0)
Monocytes Absolute: 0.5 10*3/uL (ref 0.1–1.0)
Monocytes Relative: 9 %
Neutro Abs: 4 10*3/uL (ref 1.7–7.7)
Neutrophils Relative %: 68 %
Platelets: 190 10*3/uL (ref 150–400)
RBC: 2.04 MIL/uL — ABNORMAL LOW (ref 4.22–5.81)
RDW: 18.1 % — ABNORMAL HIGH (ref 11.5–15.5)
WBC: 5.9 10*3/uL (ref 4.0–10.5)
nRBC: 0 % (ref 0.0–0.2)

## 2021-02-12 LAB — HEMOGLOBIN AND HEMATOCRIT, BLOOD
HCT: 21.6 % — ABNORMAL LOW (ref 39.0–52.0)
Hemoglobin: 6.9 g/dL — CL (ref 13.0–17.0)

## 2021-02-12 LAB — POC OCCULT BLOOD, ED: Fecal Occult Bld: POSITIVE — AB

## 2021-02-12 LAB — PREPARE RBC (CROSSMATCH)

## 2021-02-12 LAB — GLUCOSE, CAPILLARY
Glucose-Capillary: 134 mg/dL — ABNORMAL HIGH (ref 70–99)
Glucose-Capillary: 157 mg/dL — ABNORMAL HIGH (ref 70–99)

## 2021-02-12 LAB — SARS CORONAVIRUS 2 (TAT 6-24 HRS): SARS Coronavirus 2: NEGATIVE

## 2021-02-12 MED ORDER — CARVEDILOL 3.125 MG PO TABS
3.1250 mg | ORAL_TABLET | Freq: Two times a day (BID) | ORAL | Status: DC
  Administered 2021-02-12 – 2021-02-16 (×6): 3.125 mg via ORAL
  Filled 2021-02-12 (×6): qty 1

## 2021-02-12 MED ORDER — CALCIUM ACETATE (PHOS BINDER) 667 MG PO CAPS
1334.0000 mg | ORAL_CAPSULE | Freq: Three times a day (TID) | ORAL | Status: DC
  Administered 2021-02-12 – 2021-02-16 (×8): 1334 mg via ORAL
  Filled 2021-02-12 (×8): qty 2

## 2021-02-12 MED ORDER — GABAPENTIN 300 MG PO CAPS
300.0000 mg | ORAL_CAPSULE | Freq: Every day | ORAL | Status: DC
  Administered 2021-02-14 – 2021-02-16 (×3): 300 mg via ORAL
  Filled 2021-02-12 (×3): qty 1

## 2021-02-12 MED ORDER — UMECLIDINIUM-VILANTEROL 62.5-25 MCG/INH IN AEPB
1.0000 | INHALATION_SPRAY | Freq: Every day | RESPIRATORY_TRACT | Status: DC
  Administered 2021-02-14 – 2021-02-16 (×4): 1 via RESPIRATORY_TRACT
  Filled 2021-02-12: qty 14

## 2021-02-12 MED ORDER — DOCUSATE SODIUM 283 MG RE ENEM
1.0000 | ENEMA | RECTAL | Status: DC | PRN
  Filled 2021-02-12: qty 1

## 2021-02-12 MED ORDER — INSULIN ASPART 100 UNIT/ML ~~LOC~~ SOLN: 0.0000 [IU] | Freq: Three times a day (TID) | SUBCUTANEOUS | Status: DC

## 2021-02-12 MED ORDER — ZOLPIDEM TARTRATE 5 MG PO TABS
5.0000 mg | ORAL_TABLET | Freq: Every evening | ORAL | Status: DC | PRN
  Administered 2021-02-13 – 2021-02-15 (×2): 5 mg via ORAL
  Filled 2021-02-12 (×2): qty 1

## 2021-02-12 MED ORDER — CAMPHOR-MENTHOL 0.5-0.5 % EX LOTN
1.0000 "application " | TOPICAL_LOTION | Freq: Three times a day (TID) | CUTANEOUS | Status: DC | PRN
  Filled 2021-02-12: qty 222

## 2021-02-12 MED ORDER — ACETAMINOPHEN 325 MG PO TABS
650.0000 mg | ORAL_TABLET | Freq: Four times a day (QID) | ORAL | Status: DC | PRN
  Administered 2021-02-12 – 2021-02-16 (×5): 650 mg via ORAL
  Filled 2021-02-12 (×5): qty 2

## 2021-02-12 MED ORDER — ACETAMINOPHEN 650 MG RE SUPP: 650.0000 mg | Freq: Four times a day (QID) | RECTAL | Status: DC | PRN

## 2021-02-12 MED ORDER — SODIUM CHLORIDE 0.9% IV SOLUTION: Freq: Once | INTRAVENOUS | Status: AC

## 2021-02-12 MED ORDER — NICOTINE 14 MG/24HR TD PT24
14.0000 mg | MEDICATED_PATCH | Freq: Every day | TRANSDERMAL | Status: DC
  Filled 2021-02-12 (×2): qty 1

## 2021-02-12 MED ORDER — PANTOPRAZOLE SODIUM 40 MG IV SOLR
40.0000 mg | Freq: Once | INTRAVENOUS | Status: AC
  Administered 2021-02-12: 40 mg via INTRAVENOUS
  Filled 2021-02-12: qty 40

## 2021-02-12 MED ORDER — RENA-VITE PO TABS
1.0000 | ORAL_TABLET | Freq: Every day | ORAL | Status: DC
  Administered 2021-02-14 – 2021-02-15 (×2): 1 via ORAL
  Filled 2021-02-12 (×3): qty 1

## 2021-02-12 MED ORDER — LATANOPROST 0.005 % OP SOLN
1.0000 [drp] | Freq: Every day | OPHTHALMIC | Status: DC
  Administered 2021-02-12 – 2021-02-15 (×4): 1 [drp] via OPHTHALMIC
  Filled 2021-02-12: qty 2.5

## 2021-02-12 MED ORDER — TIMOLOL MALEATE 0.5 % OP SOLN
1.0000 [drp] | Freq: Every morning | OPHTHALMIC | Status: DC
  Administered 2021-02-14 – 2021-02-16 (×3): 1 [drp] via OPHTHALMIC
  Filled 2021-02-12: qty 5

## 2021-02-12 MED ORDER — ALLOPURINOL 100 MG PO TABS
100.0000 mg | ORAL_TABLET | Freq: Every day | ORAL | Status: DC
  Administered 2021-02-14 – 2021-02-16 (×3): 100 mg via ORAL
  Filled 2021-02-12 (×4): qty 1

## 2021-02-12 MED ORDER — SORBITOL 70 % SOLN
30.0000 mL | Status: DC | PRN
  Filled 2021-02-12: qty 30

## 2021-02-12 MED ORDER — CALCIUM CARBONATE ANTACID 1250 MG/5ML PO SUSP
500.0000 mg | Freq: Four times a day (QID) | ORAL | Status: DC | PRN
  Filled 2021-02-12 (×2): qty 5

## 2021-02-12 MED ORDER — SODIUM CHLORIDE 0.9% IV SOLUTION: Freq: Once | INTRAVENOUS | Status: DC

## 2021-02-12 MED ORDER — HYDRALAZINE HCL 20 MG/ML IJ SOLN: 5.0000 mg | INTRAMUSCULAR | Status: DC | PRN

## 2021-02-12 MED ORDER — HYDROXYZINE HCL 25 MG PO TABS
25.0000 mg | ORAL_TABLET | Freq: Three times a day (TID) | ORAL | Status: DC | PRN
Start: 2021-02-12 — End: 2021-02-16

## 2021-02-12 MED ORDER — BICTEGRAVIR-EMTRICITAB-TENOFOV 50-200-25 MG PO TABS
1.0000 | ORAL_TABLET | Freq: Every day | ORAL | Status: DC
  Administered 2021-02-14 – 2021-02-16 (×3): 1 via ORAL
  Filled 2021-02-12 (×5): qty 1

## 2021-02-12 MED ORDER — ISOSORBIDE MONONITRATE ER 30 MG PO TB24
30.0000 mg | ORAL_TABLET | Freq: Every day | ORAL | Status: DC
  Administered 2021-02-16: 30 mg via ORAL
  Filled 2021-02-12: qty 1

## 2021-02-12 MED ORDER — PANTOPRAZOLE SODIUM 40 MG IV SOLR
40.0000 mg | Freq: Two times a day (BID) | INTRAVENOUS | Status: DC
  Administered 2021-02-12 – 2021-02-14 (×3): 40 mg via INTRAVENOUS
  Filled 2021-02-12 (×4): qty 40

## 2021-02-12 MED ORDER — ONDANSETRON HCL 4 MG/2ML IJ SOLN
4.0000 mg | Freq: Four times a day (QID) | INTRAMUSCULAR | Status: DC | PRN
  Administered 2021-02-14: 4 mg via INTRAVENOUS
  Filled 2021-02-12: qty 2

## 2021-02-12 MED ORDER — ONDANSETRON HCL 4 MG PO TABS: 4.0000 mg | ORAL_TABLET | Freq: Four times a day (QID) | ORAL | Status: DC | PRN

## 2021-02-12 NOTE — Progress Notes (Signed)
Dr. Cyd Silence paged about patients Repeat H&H (6.9/21.6) after 2U PRBC given.

## 2021-02-12 NOTE — Progress Notes (Addendum)
HOSPITAL MEDICINE OVERNIGHT EVENT NOTE    Notified by nursing that posttransfusion CBC reveals a hemoglobin of 6.9, up from 5.9 earlier today.    This is after 2 units of packed red blood cells and unfortunately is not an appropriate rise.    Per discussion with nursing patient continues to exhibit bloody bowel movements, approximately 3 episodes since arrival to the unit.  Patient is currently hemodynamically stable however.  Ordering 1 additional unit of packed red blood cells.  Patient will require continued close clinical monitoring.  Jacob Emerald  MD [facility]

## 2021-02-12 NOTE — ED Notes (Signed)
GI PA at at bedside.

## 2021-02-12 NOTE — ED Notes (Signed)
Up to bedside commode moderate dark liquid stool, x 2. Assisted patient and cleaned returned to bed.

## 2021-02-12 NOTE — Consult Note (Addendum)
Consultation  Referring Provider: Dr. Maryan Rued     Primary Care Physician:  [name], NP Primary Gastroenterologist: Dr. Fuller Plan      Reason for Consultation: GI bleed            HPI:   Jacob [name] is a 70 y.o. male with a past medical history of ESRD on HD Monday Wednesday Friday, HIV, COPD, diabetes, CAD and recurrent GI bleed secondary to AVM, who presented to the ER today with a complaint of increased fatigue and melena.    01/24/2021 consult by our service for anemia and a GI bleed.  At that time underwent small bowel endoscopy as below with duodenal AVMs treated with APC.  01/25/2021 biopsies came back with chronic active gastritis positive for H. pylori.  Patient was started on Flagyl 250 4 times daily x10 days, bismuth subsalicylate, doxycycline and omeprazole.  Is recommended he have a urea breath test in 4 weeks to confirm eradication.    Today, the patient tells me that he was doing well, but was on antibiotics (he was not sure what for but after review of notes it looks like he was positive for H. pylori at time of last enteroscopy) and these are somewhat hard for him to take.  Apparently he finished these on Monday, 02/08/2021.  Explains that this morning he got up around 4 and had a hard-boiled egg and felt fine until he went to sit out for the bus at 5:30 in the morning and felt like he needed to have a bowel movement.  When he got to dialysis he proceeded to have a dark tarry stool.  Per nursing he has had 3 more since being here.  Patient is very frustrated that this keeps happening again and again" I am going to bleed out".  Tells me this seems to be worse than any other prior episode he has had.  When he got to dialysis they could not keep his pressure up and so he came to the ER.  Associated symptoms include increased fatigue.    Denies fever, chills, abdominal pain or symptoms that awaken him from sleep.     GI history: 01/25/2021 small bowel endoscopy Dr. Loletha Carrow:  Gastritis and a few bleeding angioectasias in the duodenum treated with APC, nodular mucosa in the duodenal bulb and first portion of the duodenum  01/09/2021 EGD for GI bleed 1. Bleeding gastric AVMs status post successful APC therapy 2. Otherwise unremarkable upper endoscopy.   12/22/2020 EGD for GI bleed - Blood in the duodenal bulb. - Nodule found in the duodenum. steady non-pulsatile bleeding seen at base, very close to distal aspect pylorus so positioning difficult - ? AVM Treated with argon plasma coagulation (APC) - partially successful Clips (MR conditional) were placed. hemostatic spray applied. No bleeding at end - The examination was otherwise normal.   10/19/20 EGD for GI bleed Gastric AVMs s/p APC treatment. - No specimens collected   12/22/19 EGD --bleeding gastric AVMs   April 2020 EGD --Gastric and duodenal bulb AVMs   Dec 2013 screening colonoscopy --moderate diverticulosis --2 mm polyp ( removed but not retrieved)   Past Medical History:  Diagnosis Date  . Acute respiratory failure ([place]) 03/01/2018  . Arthritis    "all over; mostly knees and back" (02/28/2018)  . Chronic lower back pain    stenosis  . Community acquired pneumonia 09/06/2013  . COPD (chronic obstructive pulmonary disease) ([place])   . Coronary atherosclerosis of native coronary  artery    a. 02/2003 s/p CABG x 2 (VG->RI, VG->RPDA; b. 11/2019 PCI: LM nl, LAD 90d, D3 50, RI 100, LCX 100p, OM3 100 - fills via L->L collats from D2/dLAD, RCA 100p, VG->RPDA ok, VG->RI 95 (3.5x48 Synergy XD DES).  . Drug abuse ([place])    hx; tested for cocaine as recently as 2/08. says he is not using drugs now - avoided defib. for this reason   . ESRD (end stage renal disease) ([place])    Hemo M-W-F- Richarda Blade  . Fall at home 10/2020  . GERD (gastroesophageal reflux disease)    takes OTC meds as needed  . GI bleeding    a. 11/2019 EGD: angiodysplastic lesions w/ bleeding s/p argon plasma/clipping/epi inj.  . Glaucoma    uses  eye drops daily  . Hepatitis B 1968   "tx'd w/isolation; caught it from toilet stools in gym"  . HFrEF (heart failure with reduced ejection fraction) ([place])    a. 01/2019 Echo: EF 40-45%, diffuse HK, mild basal septal hypertrophy.  . History of blood transfusion 03/01/2019  . History of colon polyps    benign  . History of gout    takes Allopurinol daily as well as Colchicine-if needed (02/28/2018)  . History of kidney stones   . HTN (hypertension)    takes Coreg,Imdur.and Apresoline daily  . Human immunodeficiency virus (HIV) disease ([place]) dx'd 2   on [place] as of 12/2020.    [name] Hyperlipidemia    takes Atorvastatin daily  . Ischemic cardiomyopathy    a. 01/2019 Echo: EF 40-45%, diffuse HK, mild basal septal hypertrophy. Diast dysfxn. Nl RV size/fxn. Sev dil LA. Triv MR/TR/PR.  [name] Muscle spasm    takes Zanaflex as needed  . Myocardial infarction ([place]) ~ 2004/2005  . Nocturia   . Peripheral neuropathy    takes gabapentin daily  . Pneumonia    "at least twice" (02/28/2018)  . Syphilis, unspecified   . Type II diabetes mellitus ([place]) 2004   Lantus daily.Average fasting blood sugar 125-199  . Wears glasses   . Wears partial dentures     Past Surgical History:  Procedure Laterality Date  . AV FISTULA PLACEMENT Left 08/02/2018   Procedure: ARTERIOVENOUS (AV) FISTULA CREATION  left arm radiocephlic;  Surgeon: Marty Heck, MD;  Location: [place];  Service: Vascular;  Laterality: Left;  . AV FISTULA PLACEMENT Left 08/01/2019   Procedure: LEFT BRACHIOCEPHALIC ARTERIOVENOUS (AV) FISTULA CREATION;  Surgeon: [name], MD;  Location: [place];  Service: Vascular;  Laterality: Left;  . BASCILIC VEIN TRANSPOSITION Left 10/03/2019   Procedure: BASILIC VEIN TRANSPOSITION LEFT SECOND STAGE;  Surgeon: [name], MD;  Location: [place];  Service: Vascular;  Laterality: Left;  . BIOPSY  01/25/2021   Procedure: BIOPSY;  Surgeon: Doran Stabler, MD;  Location: [facility] ENDOSCOPY;  Service:  Gastroenterology;;  . CARDIAC CATHETERIZATION  10/2002; 12/19/2004   [name] 03/08/2011  . COLONOSCOPY  2013   Dr.John Henrene Pastor   . CORONARY ARTERY BYPASS GRAFT  02/24/2003   CABG X2/notes 03/08/2011  . CORONARY STENT INTERVENTION N/A 12/19/2019   Procedure: CORONARY STENT INTERVENTION;  Surgeon: Jettie Booze, MD;  Location: [place] CV LAB;  Service: Cardiovascular;  Laterality: N/A;  . ENTEROSCOPY N/A 01/25/2021   Procedure: ENTEROSCOPY;  Surgeon: Doran Stabler, MD;  Location: [place];  Service: Gastroenterology;  Laterality: N/A;  . ESOPHAGOGASTRODUODENOSCOPY (EGD) WITH PROPOFOL N/A 02/08/2019   Procedure: ESOPHAGOGASTRODUODENOSCOPY (EGD) WITH PROPOFOL;  Surgeon:  Milus Banister, MD;  Location: [facility] ENDOSCOPY;  Service: Gastroenterology;  Laterality: N/A;  . ESOPHAGOGASTRODUODENOSCOPY (EGD) WITH PROPOFOL N/A 12/22/2019   Procedure: ESOPHAGOGASTRODUODENOSCOPY (EGD) WITH PROPOFOL;  Surgeon: Lavena Bullion, DO;  Location: [place];  Service: Gastroenterology;  Laterality: N/A;  . ESOPHAGOGASTRODUODENOSCOPY (EGD) WITH PROPOFOL N/A 10/19/2020   Procedure: ESOPHAGOGASTRODUODENOSCOPY (EGD) WITH PROPOFOL;  Surgeon: Jackquline Denmark, MD;  Location: [facility] ENDOSCOPY;  Service: Endoscopy;  Laterality: N/A;  . ESOPHAGOGASTRODUODENOSCOPY (EGD) WITH PROPOFOL N/A 12/22/2020   Procedure: ESOPHAGOGASTRODUODENOSCOPY (EGD) WITH PROPOFOL;  Surgeon: Gatha Mayer, MD;  Location: [place];  Service: Endoscopy;  Laterality: N/A;  . ESOPHAGOGASTRODUODENOSCOPY (EGD) WITH PROPOFOL N/A 01/09/2021   Procedure: ESOPHAGOGASTRODUODENOSCOPY (EGD) WITH PROPOFOL;  Surgeon: Irene Shipper, MD;  Location: [facility] ENDOSCOPY;  Service: Endoscopy;  Laterality: N/A;  . HEMOSTASIS CLIP PLACEMENT  12/22/2019   Procedure: HEMOSTASIS CLIP PLACEMENT;  Surgeon: Lavena Bullion, DO;  Location: [place] ENDOSCOPY;  Service: Gastroenterology;;  . HEMOSTASIS CLIP PLACEMENT  12/22/2020   Procedure: HEMOSTASIS CLIP PLACEMENT;  Surgeon:  Gatha Mayer, MD;  Location: [facility] ENDOSCOPY;  Service: Endoscopy;;  . HEMOSTASIS CONTROL  12/22/2020   Procedure: HEMOSTASIS CONTROL/hemospray;  Surgeon: Gatha Mayer, MD;  Location: [place];  Service: Endoscopy;;  . HOT HEMOSTASIS N/A 02/08/2019   Procedure: HOT HEMOSTASIS (ARGON PLASMA COAGULATION/BICAP);  Surgeon: Milus Banister, MD;  Location: [facility] ENDOSCOPY;  Service: Gastroenterology;  Laterality: N/A;  . HOT HEMOSTASIS N/A 12/22/2019   Procedure: HOT HEMOSTASIS (ARGON PLASMA COAGULATION/BICAP);  Surgeon: Lavena Bullion, DO;  Location: [facility] ENDOSCOPY;  Service: Gastroenterology;  Laterality: N/A;  . HOT HEMOSTASIS N/A 10/19/2020   Procedure: HOT HEMOSTASIS (ARGON PLASMA COAGULATION/BICAP);  Surgeon: Jackquline Denmark, MD;  Location: [facility] ENDOSCOPY;  Service: Endoscopy;  Laterality: N/A;  . HOT HEMOSTASIS N/A 12/22/2020   Procedure: HOT HEMOSTASIS (ARGON PLASMA COAGULATION/BICAP);  Surgeon: Gatha Mayer, MD;  Location: [name] ENDOSCOPY;  Service: Endoscopy;  Laterality: N/A;  . HOT HEMOSTASIS N/A 01/09/2021   Procedure: HOT HEMOSTASIS (ARGON PLASMA COAGULATION/BICAP);  Surgeon: Irene Shipper, MD;  Location: [facility] ENDOSCOPY;  Service: Endoscopy;  Laterality: N/A;  . HOT HEMOSTASIS N/A 01/25/2021   Procedure: HOT HEMOSTASIS (ARGON PLASMA COAGULATION/BICAP);  Surgeon: Doran Stabler, MD;  Location: [place];  Service: Gastroenterology;  Laterality: N/A;  . INTERTROCHANTERIC HIP FRACTURE SURGERY Left 11/2006   [name] 03/08/2011  . INTRAVASCULAR ULTRASOUND/IVUS N/A 12/19/2019   Procedure: Intravascular Ultrasound/IVUS;  Surgeon: Jettie Booze, MD;  Location: [place] CV LAB;  Service: Cardiovascular;  Laterality: N/A;  . IR FLUORO GUIDE CV LINE RIGHT  07/24/2019  . IR FLUORO GUIDE CV LINE RIGHT  07/30/2019  . IR US GUIDE VASC ACCESS RIGHT  07/24/2019  . IR US GUIDE VASC ACCESS RIGHT  07/30/2019  . LAPAROSCOPIC CHOLECYSTECTOMY  05/2006  . LIGATION OF COMPETING BRANCHES OF ARTERIOVENOUS  FISTULA Left 11/05/2018   Procedure: LIGATION OF COMPETING BRANCHES OF ARTERIOVENOUS FISTULA  LEFT  ARM;  Surgeon: Marty Heck, MD;  Location: [place];  Service: Vascular;  Laterality: Left;  . LUMBAR LAMINECTOMY/DECOMPRESSION MICRODISCECTOMY N/A 02/29/2016   Procedure: Left L4-5 Lateral Recess Decompression, Removal Extradural Intraspinal Facet Cyst;  Surgeon: Marybelle Killings, MD;  Location: [place];  Service: Orthopedics;  Laterality: N/A;  . MULTIPLE TOOTH EXTRACTIONS    . ORIF MANDIBULAR FRACTURE Left 08/13/2004   ORIF of left body fracture mandible with KLS Martin 2.3-mm six hole/notes 03/08/2011  . RIGHT/LEFT HEART CATH AND CORONARY/GRAFT ANGIOGRAPHY N/A 12/19/2019  Procedure: RIGHT/LEFT HEART CATH AND CORONARY/GRAFT ANGIOGRAPHY;  Surgeon: Jettie Booze, MD;  Location: [place] CV LAB;  Service: Cardiovascular;  Laterality: N/A;  . SCLEROTHERAPY  12/22/2019   Procedure: SCLEROTHERAPY;  Surgeon: Lavena Bullion, DO;  Location: [facility] ENDOSCOPY;  Service: Gastroenterology;;    Family History  Problem Relation Age of Onset  . Heart failure Father   . Hypertension Father   . Diabetes Brother   . Heart attack Brother   . Alzheimer's disease Mother   . Stroke Sister   . Diabetes Sister   . Alzheimer's disease Sister   . Hypertension Brother   . Diabetes Brother   . Drug abuse Brother   . Colon cancer Neg Hx     Social History   Tobacco Use  . Smoking status: Current Every Day Smoker    Packs/day: 0.50    Years: 43.00    Pack years: 21.50    Types: Cigarettes  . Smokeless tobacco: Never Used  Vaping Use  . Vaping Use: Never used  Substance Use Topics  . Alcohol use: Not Currently    Alcohol/week: 12.0 standard drinks    Types: 12 Standard drinks or equivalent per week    Comment: occassional  . Drug use: Not Currently    Types: Cocaine    Comment: hx of crack/cocaine 56yrs ago 10/01/2019- none    Prior to Admission medications   Medication Sig Start Date End Date  Taking? Authorizing Provider  acetaminophen (TYLENOL) 650 MG CR tablet Take 1,300 mg by mouth 3 (three) times daily.    [provider]  allopurinol (ZYLOPRIM) 100 MG tablet TAKE 1 TABLET(100 MG) BY MOUTH DAILY Patient taking differently: Take 100 mg by mouth daily. 01/12/21   [name], Dinah C, NP  ANORO ELLIPTA 62.5-25 MCG/INH AEPB INHALE 1 PUFF BY MOUTH EVERY DAY Patient taking differently: Inhale 1 puff into the lungs daily. 12/21/20   [name], NP  aspirin EC 81 MG tablet Take 1 tablet (81 mg total) by mouth in the morning. Swallow whole. 01/31/21   Ghimire, Henreitta Leber, MD  B-D UF III MINI PEN NEEDLES 31G X 5 MM MISC USE FOUR TIMES DAILY 03/02/20   [name], NP  bictegravir-emtricitabine-tenofovir AF (BIKTARVY) 50-200-25 MG TABS tablet Take 1 tablet by mouth daily. 06/30/20   [name], MD  Calcium Acetate 667 MG TABS Take [phone] mg by mouth See admin instructions. Take 1,334 mg by mouth three times a day with meals and 667 mg with each snack 01/20/21   [provider]  carvedilol (COREG) 3.125 MG tablet Take 1 tablet (3.125 mg total) by mouth 2 (two) times daily. 02/13/20   [name], MD  Cinacalcet HCl (SENSIPAR PO) Take 1 tablet by mouth 3 (three) times a week. At dialysis 08/14/20 08/13/21  [provider]  colchicine 0.6 MG tablet Take 0.6-1.2 mg by mouth daily as needed (as directed for gout flares).    [provider]  diclofenac Sodium (VOLTAREN) 1 % GEL APPLY 2 GRAMS TO THE AFFECTED AREA THREE TIMES DAILY AS NEEDED FOR PAIN Patient taking differently: Apply 2 g topically 3 (three) times daily as needed (for pain). 01/12/21   [name], Dinah C, NP  folic acid (FOLVITE) 1 MG tablet Take 1 tablet (1 mg total) by mouth daily. 01/12/21   [name], Dinah C, NP  gabapentin (NEURONTIN) 300 MG capsule Take 1 capsule (300 mg total) by mouth daily. Dose change due to renal function Patient taking  differently: Take 300 mg by mouth daily.  12/09/20   [name], NP  insulin lispro (HUMALOG) 100 UNIT/ML KwikPen INJECT 20 UNITS UNDER THE SKIN THREE TIMES DAILY AS NEEDED FOR HIGH BLOOD SUGAR(ABOVE 150) 02/01/21   [name], NP  iron sucrose in sodium chloride 0.9 % 100 mL Iron Sucrose (Venofer) 07/08/20 07/07/21  [provider]  isosorbide mononitrate (IMDUR) 30 MG 24 hr tablet TAKE 1 TABLET BY MOUTH EVERY DAY Patient taking differently: Take 30 mg by mouth daily. 01/12/21   [name], Dinah C, NP  Lancets (ONETOUCH DELICA PLUS [id number]) MISC Inject 1 Device as directed 3 (three) times daily. Dx: E11.40 09/18/19   [name], NP  latanoprost (XALATAN) 0.005 % ophthalmic solution Place 1 drop into both eyes at bedtime.    [provider]  lidocaine-prilocaine (EMLA) cream Apply 1 application topically every Monday, Wednesday, and Friday with hemodialysis.    [provider]  Methoxy PEG-Epoetin Beta (MIRCERA IJ) Mircera 12/25/20   [provider]  multivitamin (RENA-VIT) TABS tablet Take 1 tablet by mouth daily.    [provider]  nitroGLYCERIN (NITROSTAT) 0.3 MG SL tablet ONE TABLET UNDER TONGUE AS NEEDED FOR CHEST PAIN Patient taking differently: Place 0.3 mg under the tongue as needed for chest pain. 05/01/19   [name], MD  ONETOUCH ULTRA test strip CHECK BLOOD SUGAR 3 TIMES  DAILY Patient taking differently: 1 each by Other route 3 (three) times daily. 05/27/20   [name], NP  pantoprazole (PROTONIX) 40 MG tablet TAKE 1 TABLET(40 MG) BY MOUTH TWICE DAILY Patient taking differently: Take 40 mg by mouth 2 (two) times daily before a meal. 01/12/21   [name], Dinah C, NP  polyethylene glycol (MIRALAX / GLYCOLAX) 17 g packet Take 17 g by mouth daily as needed for mild constipation. Patient taking differently: Take 17 g by mouth daily as needed for mild constipation (MIX AND DRINK). 11/07/20   Aline August, MD  timolol (TIMOPTIC) 0.5 % ophthalmic solution Place 1  drop into both eyes in the morning. 04/04/19   [provider]    Current Facility-Administered Medications  Medication Dose Route Frequency Provider Last Rate Last Admin  . 0.9 %  sodium chloride infusion (Manually program via Guardrails IV Fluids)   Intravenous Once Blanchie Dessert, MD      . pantoprazole (PROTONIX) injection 40 mg  40 mg Intravenous Once Blanchie Dessert, MD       Current Outpatient Medications  Medication Sig Dispense Refill  . acetaminophen (TYLENOL) 650 MG CR tablet Take 1,300 mg by mouth 3 (three) times daily.    [name] allopurinol (ZYLOPRIM) 100 MG tablet TAKE 1 TABLET(100 MG) BY MOUTH DAILY (Patient taking differently: Take 100 mg by mouth daily.) 90 tablet 1  . ANORO ELLIPTA 62.5-25 MCG/INH AEPB INHALE 1 PUFF BY MOUTH EVERY DAY (Patient taking differently: Inhale 1 puff into the lungs daily.) 60 each 5  . aspirin EC 81 MG tablet Take 1 tablet (81 mg total) by mouth in the morning. Swallow whole. 30 tablet 11  . B-D UF III MINI PEN NEEDLES 31G X 5 MM MISC USE FOUR TIMES DAILY 100 each 11  . bictegravir-emtricitabine-tenofovir AF (BIKTARVY) 50-200-25 MG TABS tablet Take 1 tablet by mouth daily. 30 tablet 11  . Calcium Acetate 667 MG TABS Take 667-1,334 mg by mouth See admin instructions. Take 1,334 mg by mouth three times a day with meals and 667 mg with each snack    .  carvedilol (COREG) 3.125 MG tablet Take 1 tablet (3.125 mg total) by mouth 2 (two) times daily. 180 tablet 3  . Cinacalcet HCl (SENSIPAR PO) Take 1 tablet by mouth 3 (three) times a week. At dialysis    . colchicine 0.6 MG tablet Take 0.6-1.2 mg by mouth daily as needed (as directed for gout flares).    . diclofenac Sodium (VOLTAREN) 1 % GEL APPLY 2 GRAMS TO THE AFFECTED AREA THREE TIMES DAILY AS NEEDED FOR PAIN (Patient taking differently: Apply 2 g topically 3 (three) times daily as needed (for pain).) 166 g 0  . folic acid (FOLVITE) 1 MG tablet Take 1 tablet (1 mg total) by mouth daily. 90 tablet  1  . gabapentin (NEURONTIN) 300 MG capsule Take 1 capsule (300 mg total) by mouth daily. Dose change due to renal function (Patient taking differently: Take 300 mg by mouth daily.) 90 capsule 1  . insulin lispro (HUMALOG) 100 UNIT/ML KwikPen INJECT 20 UNITS UNDER THE SKIN THREE TIMES DAILY AS NEEDED FOR HIGH BLOOD SUGAR(ABOVE 150) 45 mL 1  . iron sucrose in sodium chloride 0.9 % 100 mL Iron Sucrose (Venofer)    . isosorbide mononitrate (IMDUR) 30 MG 24 hr tablet TAKE 1 TABLET BY MOUTH EVERY DAY (Patient taking differently: Take 30 mg by mouth daily.) 90 tablet 1  . Lancets (ONETOUCH DELICA PLUS [id number]) MISC Inject 1 Device as directed 3 (three) times daily. Dx: E11.40 300 each 3  . latanoprost (XALATAN) 0.005 % ophthalmic solution Place 1 drop into both eyes at bedtime.    . lidocaine-prilocaine (EMLA) cream Apply 1 application topically every Monday, Wednesday, and Friday with hemodialysis.    . Methoxy PEG-Epoetin Beta (MIRCERA IJ) Mircera    . multivitamin (RENA-VIT) TABS tablet Take 1 tablet by mouth daily.    . nitroGLYCERIN (NITROSTAT) 0.3 MG SL tablet ONE TABLET UNDER TONGUE AS NEEDED FOR CHEST PAIN (Patient taking differently: Place 0.3 mg under the tongue as needed for chest pain.) 25 tablet 4  . ONETOUCH ULTRA test strip CHECK BLOOD SUGAR 3 TIMES  DAILY (Patient taking differently: 1 each by Other route 3 (three) times daily.) 300 strip 3  . pantoprazole (PROTONIX) 40 MG tablet TAKE 1 TABLET(40 MG) BY MOUTH TWICE DAILY (Patient taking differently: Take 40 mg by mouth 2 (two) times daily before a meal.) 180 tablet 1  . polyethylene glycol (MIRALAX / GLYCOLAX) 17 g packet Take 17 g by mouth daily as needed for mild constipation. (Patient taking differently: Take 17 g by mouth daily as needed for mild constipation (MIX AND DRINK).) 14 each 0  . timolol (TIMOPTIC) 0.5 % ophthalmic solution Place 1 drop into both eyes in the morning.      Allergies as of 02/12/2021 - Review Complete  02/04/2021  Allergen Reaction Noted  . Augmentin [amoxicillin-pot clavulanate] Diarrhea and Other (See Comments) 12/05/2018  . Mucinex fast-max Other (See Comments) 11/30/2020  . Amphetamines Other (See Comments) 01/15/2012     Review of Systems:    Constitutional: No weight loss, fever or chills Skin: No rash  Cardiovascular: No chest pain Respiratory: No SOB  Gastrointestinal: See HPI and otherwise negative Genitourinary: No dysuria  Neurological: No headache, dizziness or syncope Musculoskeletal: No new muscle or joint pain Hematologic: No bruising Psychiatric: No history of depression or anxiety    Physical Exam:  Vital signs in last 24 hours: Temp:  [97.5 F (36.4 C)] 97.5 F (36.4 C) (04/22 0903) Pulse Rate:  [89-100] 100 (04/22 1000)  Resp:  [15-18] 15 (04/22 1000) BP: (96-99)/(42-56) 98/42 (04/22 1000) SpO2:  [96 %-100 %] 100 % (04/22 1000) Weight:  [78.5 kg] 78.5 kg (04/22 0903)   General:   Pleasant AA male appears to be in NAD, Well developed, Well nourished, alert and cooperative Head:  Normocephalic and atraumatic. Eyes:   PEERL, EOMI. No icterus. Conjunctiva pink. Ears:  Normal auditory acuity. Neck:  Supple Throat: Oral cavity and pharynx without inflammation, swelling or lesion.  Lungs: Respirations even and unlabored. Lungs clear to auscultation bilaterally.   No wheezes, crackles, or rhonchi.  Heart: Normal S1, S2. No MRG. Regular rate and rhythm. No peripheral edema, cyanosis or pallor.  Abdomen:  Soft, nondistended, nontender. No rebound or guarding. Normal bowel sounds. No appreciable masses or hepatomegaly. Rectal:  Not performed.  Msk:  Symmetrical without gross deformities. Peripheral pulses intact.  Extremities:  Without edema, no deformity or joint abnormality.  Neurologic:  Alert and  oriented x4;  grossly normal neurologically.  Skin:   Dry and intact without significant lesions or rashes. Psychiatric: Demonstrates good judgement and reason  without abnormal affect or behaviors.   LAB RESULTS: Recent Labs    02/12/21 0855  WBC 5.9  HGB 5.9*  HCT 20.0*  PLT 190   BMET Recent Labs    02/12/21 0855  NA 139  K 4.7  CL 99  CO2 28  GLUCOSE 124*  BUN 77*  CREATININE 6.76*  CALCIUM 8.8*   LFT Recent Labs    02/12/21 0855  PROT 6.3*  ALBUMIN 2.9*  AST 16  ALT 12  ALKPHOS 64  BILITOT 0.5      Impression / Plan:   Impression: 1.  Acute on chronic anemia: History of the same related to gastric/intestinal AVMs, hemoglobin 5.9 today (9.3 on 01/26/2021), for melenic movements today, no abdominal pain; most likely bleeding AVMs 2.  CAD/remote CABG/stent February 5784/ONGEXBM systolic/diastolic heart failure 3.  ESRD on HD Monday Wednesday Friday 4.  HIV on therapy  Plan: 1.  We will plan for repeat enteroscopy tomorrow 4/23 with intervention as needed 2.  Transfuse to hemoglobin greater than 7.  Then continue to monitor hemoglobin every 8 hours with transfusion as needed. 3.  Continue Pantoprazole 40 mg twice daily 4.  Okay for clear liquids today then n.p.o. after midnight 5.  Could consider subcu octreotide in the future  6. **Of note COVID test pending at time of my exam** 7.  Please await any further recommendations from Dr. Silverio Decamp later today  Thank you for your kind consultation, we will continue to follow.  Lavone Nian [facility]  02/12/2021, 11:03 AM   Attending physician's note   I have taken a history, examined the patient and reviewed the chart. I agree with the Advanced Practitioner's note, impression and recommendations.  70 year old male with history of end-stage renal disease on hemodialysis, HIV, COPD, diabetes, CAD with known gastric and duodenal AVMs admitted with symptomatic anemia and melena  His presentation is concerning for recurrent GI bleed from AVM's We will plan for push enteroscopy tomorrow with APC  He will need close monitoring of H&H weekly during dialysis, IV iron infusion  and darbepoetin as needed He may also benefit from subcutaneous Sandostatin for management of AVMs Follow-up with hematology  The risks and benefits as well as alternatives of endoscopic procedure(s) have been discussed and reviewed. All questions answered. The patient agrees to proceed.    Damaris Hippo , MD [phone]

## 2021-02-12 NOTE — H&P (Addendum)
History and Physical    Jacob [name] OFB:[id number] DOB: [birth year] DOA: 02/12/2021  PCP: [name], NP Consultants:  Nephrology; McDonald - podiatry; Tomma Ehinger - orthopedics; Perrie.Foyer - cardiology Patient coming from: Home; NOK: Virgina Organ, [phone]  Chief Complaint: Recurrent GI bleeding  HPI: Jacob [name] is a 70 y.o. male with medical history significant of DM; CAD; HIV; HTN; chronic systolic CHF; ESRD on MWF HD; prior cocaine abuse; and chronic back pain presenting with GI bleeding.  He was last hospitalized from 4/3-5 with the same; EGD showed AVMs treated with APC with home PPI and ASA held through 4/10.  He reports that he started ASA again on 4/10 as instructed.  He had no apparent issues until last night, when he woke up overnight feeling bloated when he got up for HD.  He noticed rumbling in his belly but went to HD anyway and had a large tarry BM upon arrival about 0600.  He attempted to do HD and got through about 40 minutes when it happened again and he was sent to the ER.  He has had 3-4 stools while in the ER.  He is frustrated that this keeps happening.    ED Course:  Recurrent GI bleeds, AVMs with APC and clipping.  Previously on Brilinta but taken off in March and transitioned to ASA.  Another bleed earlier this month, s/p EGD, gastric antral edema, transfused and given IV iron.  Hgb 9.6 on 4/14, more weak and tired and had black BM at HD today (back on ASA) with 4 more, heme positive.  Hgb 5.9.  [facility] to see.  Only got 45 minutes of HD today.  Protonix and blood ordered.  Review of Systems: As per HPI; otherwise review of systems reviewed and negative.    COVID Vaccine Status:   Complete  Past Medical History:  Diagnosis Date  . Acute respiratory failure ([place]) 03/01/2018  . Arthritis    "all over; mostly knees and back" (02/28/2018)  . Chronic lower back pain    stenosis  . Community acquired pneumonia 09/06/2013  . COPD (chronic obstructive  pulmonary disease) ([place])   . Coronary atherosclerosis of native coronary artery    a. 02/2003 s/p CABG x 2 (VG->RI, VG->RPDA; b. 11/2019 PCI: LM nl, LAD 90d, D3 50, RI 100, LCX 100p, OM3 100 - fills via L->L collats from D2/dLAD, RCA 100p, VG->RPDA ok, VG->RI 95 (3.5x48 Synergy XD DES).  . Drug abuse ([place])    hx; tested for cocaine as recently as 2/08. says he is not using drugs now - avoided defib. for this reason   . ESRD (end stage renal disease) ([place])    Hemo M-W-F- Richarda Blade  . Fall at home 10/2020  . GERD (gastroesophageal reflux disease)    takes OTC meds as needed  . GI bleeding    a. 11/2019 EGD: angiodysplastic lesions w/ bleeding s/p argon plasma/clipping/epi inj.  . Glaucoma    uses eye drops daily  . Hepatitis B 1968   "tx'd w/isolation; caught it from toilet stools in gym"  . HFrEF (heart failure with reduced ejection fraction) ([place])    a. 01/2019 Echo: EF 40-45%, diffuse HK, mild basal septal hypertrophy.  . History of blood transfusion 03/01/2019  . History of colon polyps    benign  . History of gout    takes Allopurinol daily as well as Colchicine-if needed (02/28/2018)  . History of kidney stones   . HTN (hypertension)  takes Coreg,Imdur.and Apresoline daily  . Human immunodeficiency virus (HIV) disease ([place]) dx'd 62   on [place] as of 12/2020.    [name] Hyperlipidemia    takes Atorvastatin daily  . Ischemic cardiomyopathy    a. 01/2019 Echo: EF 40-45%, diffuse HK, mild basal septal hypertrophy. Diast dysfxn. Nl RV size/fxn. Sev dil LA. Triv MR/TR/PR.  [name] Muscle spasm    takes Zanaflex as needed  . Myocardial infarction ([place]) ~ 2004/2005  . Nocturia   . Peripheral neuropathy    takes gabapentin daily  . Pneumonia    "at least twice" (02/28/2018)  . Syphilis, unspecified   . Type II diabetes mellitus ([place]) 2004   Lantus daily.Average fasting blood sugar 125-199  . Wears glasses   . Wears partial dentures     Past Surgical History:  Procedure Laterality Date  .  AV FISTULA PLACEMENT Left 08/02/2018   Procedure: ARTERIOVENOUS (AV) FISTULA CREATION  left arm radiocephlic;  Surgeon: Marty Heck, MD;  Location: [place];  Service: Vascular;  Laterality: Left;  . AV FISTULA PLACEMENT Left 08/01/2019   Procedure: LEFT BRACHIOCEPHALIC ARTERIOVENOUS (AV) FISTULA CREATION;  Surgeon: [name], MD;  Location: [place];  Service: Vascular;  Laterality: Left;  . BASCILIC VEIN TRANSPOSITION Left 10/03/2019   Procedure: BASILIC VEIN TRANSPOSITION LEFT SECOND STAGE;  Surgeon: [name], MD;  Location: [place];  Service: Vascular;  Laterality: Left;  . BIOPSY  01/25/2021   Procedure: BIOPSY;  Surgeon: [place] Stabler, MD;  Location: [facility] ENDOSCOPY;  Service: Gastroenterology;;  . CARDIAC CATHETERIZATION  10/2002; 12/19/2004   [name] 03/08/2011  . COLONOSCOPY  2013   Dr.John Henrene Pastor   . CORONARY ARTERY BYPASS GRAFT  02/24/2003   CABG X2/notes 03/08/2011  . CORONARY STENT INTERVENTION N/A 12/19/2019   Procedure: CORONARY STENT INTERVENTION;  Surgeon: Jettie Booze, MD;  Location: [place] CV LAB;  Service: Cardiovascular;  Laterality: N/A;  . ENTEROSCOPY N/A 01/25/2021   Procedure: ENTEROSCOPY;  Surgeon: [place] Stabler, MD;  Location: [place];  Service: Gastroenterology;  Laterality: N/A;  . ESOPHAGOGASTRODUODENOSCOPY (EGD) WITH PROPOFOL N/A 02/08/2019   Procedure: ESOPHAGOGASTRODUODENOSCOPY (EGD) WITH PROPOFOL;  Surgeon: Milus Banister, MD;  Location: [place];  Service: Gastroenterology;  Laterality: N/A;  . ESOPHAGOGASTRODUODENOSCOPY (EGD) WITH PROPOFOL N/A 12/22/2019   Procedure: ESOPHAGOGASTRODUODENOSCOPY (EGD) WITH PROPOFOL;  Surgeon: Lavena Bullion, DO;  Location: [place];  Service: Gastroenterology;  Laterality: N/A;  . ESOPHAGOGASTRODUODENOSCOPY (EGD) WITH PROPOFOL N/A 10/19/2020   Procedure: ESOPHAGOGASTRODUODENOSCOPY (EGD) WITH PROPOFOL;  Surgeon: Jackquline Denmark, MD;  Location: [facility] ENDOSCOPY;  Service: Endoscopy;  Laterality:  N/A;  . ESOPHAGOGASTRODUODENOSCOPY (EGD) WITH PROPOFOL N/A 12/22/2020   Procedure: ESOPHAGOGASTRODUODENOSCOPY (EGD) WITH PROPOFOL;  Surgeon: Gatha Mayer, MD;  Location: [place];  Service: Endoscopy;  Laterality: N/A;  . ESOPHAGOGASTRODUODENOSCOPY (EGD) WITH PROPOFOL N/A 01/09/2021   Procedure: ESOPHAGOGASTRODUODENOSCOPY (EGD) WITH PROPOFOL;  Surgeon: Irene Shipper, MD;  Location: [facility] END[facility]COPY;  Service: Endoscopy;  Laterality: N/A;  . HEMOSTASIS CLIP PLACEMENT  12/22/2019   Procedure: HEMOSTASIS CLIP PLACEMENT;  Surgeon: Lavena Bullion, DO;  Location: [place] ENDOSCOPY;  Service: Gastroenterology;;  . HEMOSTASIS CLIP PLACEMENT  12/22/2020   Procedure: HEMOSTASIS CLIP PLACEMENT;  Surgeon: Gatha Mayer, MD;  Location: [facility] ENDOSCOPY;  Service: Endoscopy;;  . HEMOSTASIS CONTROL  12/22/2020   Procedure: HEMOSTASIS CONTROL/hemospray;  Surgeon: Gatha Mayer, MD;  Location: [place];  Service: Endoscopy;;  . HOT HEMOSTASIS N/A 02/08/2019   Procedure: HOT HEMOSTASIS (ARGON  PLASMA COAGULATION/BICAP);  Surgeon: Milus Banister, MD;  Location: [facility] ENDOSCOPY;  Service: Gastroenterology;  Laterality: N/A;  . HOT HEMOSTASIS N/A 12/22/2019   Procedure: HOT HEMOSTASIS (ARGON PLASMA COAGULATION/BICAP);  Surgeon: Lavena Bullion, DO;  Location: [facility] ENDOSCOPY;  Service: Gastroenterology;  Laterality: N/A;  . HOT HEMOSTASIS N/A 10/19/2020   Procedure: HOT HEMOSTASIS (ARGON PLASMA COAGULATION/BICAP);  Surgeon: Jackquline Denmark, MD;  Location: [facility] ENDOSCOPY;  Service: Endoscopy;  Laterality: N/A;  . HOT HEMOSTASIS N/A 12/22/2020   Procedure: HOT HEMOSTASIS (ARGON PLASMA COAGULATION/BICAP);  Surgeon: Gatha Mayer, MD;  Location: [facility] ENDOSCOPY;  Service: Endoscopy;  Laterality: N/A;  . HOT HEMOSTASIS N/A 01/09/2021   Procedure: HOT HEMOSTASIS (ARGON PLASMA COAGULATION/BICAP);  Surgeon: Irene Shipper, MD;  Location: [facility] ENDOSCOPY;  Service: Endoscopy;  Laterality: N/A;  . HOT HEMOSTASIS N/A 01/25/2021   Procedure: HOT  HEMOSTASIS (ARGON PLASMA COAGULATION/BICAP);  Surgeon: [place] Stabler, MD;  Location: [place];  Service: Gastroenterology;  Laterality: N/A;  . INTERTROCHANTERIC HIP FRACTURE SURGERY Left 11/2006   [name] 03/08/2011  . INTRAVASCULAR ULTRASOUND/IVUS N/A 12/19/2019   Procedure: Intravascular Ultrasound/IVUS;  Surgeon: Jettie Booze, MD;  Location: [place] CV LAB;  Service: Cardiovascular;  Laterality: N/A;  . IR FLUORO GUIDE CV LINE RIGHT  07/24/2019  . IR FLUORO GUIDE CV LINE RIGHT  07/30/2019  . IR US GUIDE VASC ACCESS RIGHT  07/24/2019  . IR US GUIDE VASC ACCESS RIGHT  07/30/2019  . LAPAROSCOPIC CHOLECYSTECTOMY  05/2006  . LIGATION OF COMPETING BRANCHES OF ARTERIOVENOUS FISTULA Left 11/05/2018   Procedure: LIGATION OF COMPETING BRANCHES OF ARTERIOVENOUS FISTULA  LEFT  ARM;  Surgeon: Marty Heck, MD;  Location: [place];  Service: Vascular;  Laterality: Left;  . LUMBAR LAMINECTOMY/DECOMPRESSION MICRODISCECTOMY N/A 02/29/2016   Procedure: Left L4-5 Lateral Recess Decompression, Removal Extradural Intraspinal Facet Cyst;  Surgeon: Marybelle Killings, MD;  Location: [place];  Service: Orthopedics;  Laterality: N/A;  . MULTIPLE TOOTH EXTRACTIONS    . ORIF MANDIBULAR FRACTURE Left 08/13/2004   ORIF of left body fracture mandible with KLS Martin 2.3-mm six hole/notes 03/08/2011  . RIGHT/LEFT HEART CATH AND CORONARY/GRAFT ANGIOGRAPHY N/A 12/19/2019   Procedure: RIGHT/LEFT HEART CATH AND CORONARY/GRAFT ANGIOGRAPHY;  Surgeon: Jettie Booze, MD;  Location: [place] CV LAB;  Service: Cardiovascular;  Laterality: N/A;  . SCLEROTHERAPY  12/22/2019   Procedure: SCLEROTHERAPY;  Surgeon: Lavena Bullion, DO;  Location: [facility] ENDOSCOPY;  Service: Gastroenterology;;    Social History   Socioeconomic History  . Marital status: Single    Spouse name: Not on file  . Number of children: 1  . Years of education: Not on file  . Highest education level: Not on file  Occupational History  .  Occupation: retired  Tobacco Use  . Smoking status: Current Every Day Smoker    Packs/day: 0.50    Years: 43.00    Pack years: 21.50    Types: Cigarettes  . Smokeless tobacco: Never Used  Vaping Use  . Vaping Use: Never used  Substance and Sexual Activity  . Alcohol use: Not Currently    Alcohol/week: 12.0 standard drinks    Types: 12 Standard drinks or equivalent per week    Comment: occassional  . Drug use: Not Currently    Types: Cocaine    Comment: hx of crack/cocaine 63yrs ago 10/01/2019- none  . Sexual activity: Yes    Partners: Male  Other Topics Concern  . Not on file  Social History Narrative  Retired.       As of 05/2015:   Diet: No salt    Caffeine   Married: Single    House: Condo, 2 stories, 1 person (self)    Pets: No    Current/Past profession: N/A   Exercise: walks daily    Living Will: Yes    DNR   POA/HPOA: No       Social Determinants of Health   Financial Resource Strain: Low Risk   . Difficulty of Paying Living Expenses: Not hard at all  Food Insecurity: No Food Insecurity  . Worried About Charity fundraiser in the Last Year: Never true  . Ran Out of Food in the Last Year: Never true  Transportation Needs: No Transportation Needs  . Lack of Transportation (Medical): No  . Lack of Transportation (Non-Medical): No  Physical Activity: Not on file  Stress: Not on file  Social Connections: Not on file  Intimate Partner Violence: Not on file    Allergies  Allergen Reactions  . Augmentin [Amoxicillin-Pot Clavulanate] Diarrhea and Other (See Comments)    Severe diarrhea  . Mucinex Fast-Max Other (See Comments)    Intense sweating   . Amphetamines Other (See Comments)    Unknown reaction    Family History  Problem Relation Age of Onset  . Heart failure Father   . Hypertension Father   . Diabetes Brother   . Heart attack Brother   . Alzheimer's disease Mother   . Stroke Sister   . Diabetes Sister   . Alzheimer's disease Sister   .  Hypertension Brother   . Diabetes Brother   . Drug abuse Brother   . Colon cancer Neg Hx     Prior to Admission medications   Medication Sig Start Date End Date Taking? Authorizing Provider  acetaminophen (TYLENOL) 650 MG CR tablet Take 1,300 mg by mouth 3 (three) times daily.    [provider]  allopurinol (ZYLOPRIM) 100 MG tablet TAKE 1 TABLET(100 MG) BY MOUTH DAILY Patient taking differently: Take 100 mg by mouth daily. 01/12/21   [name], Dinah C, NP  ANORO ELLIPTA 62.5-25 MCG/INH AEPB INHALE 1 PUFF BY MOUTH EVERY DAY Patient taking differently: Inhale 1 puff into the lungs daily. 12/21/20   [name], NP  aspirin EC 81 MG tablet Take 1 tablet (81 mg total) by mouth in the morning. Swallow whole. 01/31/21   Ghimire, Henreitta Leber, MD  B-D UF III MINI PEN NEEDLES 31G X 5 MM MISC USE FOUR TIMES DAILY 03/02/20   [name], NP  bictegravir-emtricitabine-tenofovir AF (BIKTARVY) 50-200-25 MG TABS tablet Take 1 tablet by mouth daily. 06/30/20   [name], MD  Calcium Acetate 667 MG TABS Take [phone] mg by mouth See admin instructions. Take 1,334 mg by mouth three times a day with meals and 667 mg with each snack 01/20/21   [provider]  carvedilol (COREG) 3.125 MG tablet Take 1 tablet (3.125 mg total) by mouth 2 (two) times daily. 02/13/20   [name], MD  Cinacalcet HCl (SENSIPAR PO) Take 1 tablet by mouth 3 (three) times a week. At dialysis 08/14/20 08/13/21  [provider]  colchicine 0.6 MG tablet Take 0.6-1.2 mg by mouth daily as needed (as directed for gout flares).    [provider]  diclofenac Sodium (VOLTAREN) 1 % GEL APPLY 2 GRAMS TO THE AFFECTED AREA THREE TIMES DAILY AS NEEDED FOR PAIN Patient taking differently: Apply 2 g topically 3 (three)  times daily as needed (for pain). 01/12/21   [name], Dinah C, NP  folic acid (FOLVITE) 1 MG tablet Take 1 tablet (1 mg total) by mouth daily. 01/12/21   [name], Dinah C, NP  gabapentin  (NEURONTIN) 300 MG capsule Take 1 capsule (300 mg total) by mouth daily. Dose change due to renal function Patient taking differently: Take 300 mg by mouth daily. 12/09/20   [name], NP  insulin lispro (HUMALOG) 100 UNIT/ML KwikPen INJECT 20 UNITS UNDER THE SKIN THREE TIMES DAILY AS NEEDED FOR HIGH BLOOD SUGAR(ABOVE 150) 02/01/21   [name], NP  iron sucrose in sodium chloride 0.9 % 100 mL Iron Sucrose (Venofer) 07/08/20 07/07/21  [provider]  isosorbide mononitrate (IMDUR) 30 MG 24 hr tablet TAKE 1 TABLET BY MOUTH EVERY DAY Patient taking differently: Take 30 mg by mouth daily. 01/12/21   [name], Dinah C, NP  Lancets (ONETOUCH DELICA PLUS HENIDP82U) MISC Inject 1 Device as directed 3 (three) times daily. Dx: E11.40 09/18/19   [name], NP  latanoprost (XALATAN) 0.005 % ophthalmic solution Place 1 drop into both eyes at bedtime.    [provider]  lidocaine-prilocaine (EMLA) cream Apply 1 application topically every Monday, Wednesday, and Friday with hemodialysis.    [provider]  Methoxy PEG-Epoetin Beta (MIRCERA IJ) Mircera 12/25/20   [provider]  multivitamin (RENA-VIT) TABS tablet Take 1 tablet by mouth daily.    [provider]  nitroGLYCERIN (NITROSTAT) 0.3 MG SL tablet ONE TABLET UNDER TONGUE AS NEEDED FOR CHEST PAIN Patient taking differently: Place 0.3 mg under the tongue as needed for chest pain. 05/01/19   [name], MD  ONETOUCH ULTRA test strip CHECK BLOOD SUGAR 3 TIMES  DAILY Patient taking differently: 1 each by Other route 3 (three) times daily. 05/27/20   [name], NP  pantoprazole (PROTONIX) 40 MG tablet TAKE 1 TABLET(40 MG) BY MOUTH TWICE DAILY Patient taking differently: Take 40 mg by mouth 2 (two) times daily before a meal. 01/12/21   [name], Dinah C, NP  polyethylene glycol (MIRALAX / GLYCOLAX) 17 g packet Take 17 g by mouth daily as needed for mild constipation. Patient taking  differently: Take 17 g by mouth daily as needed for mild constipation (MIX AND DRINK). 11/07/20   Aline August, MD  timolol (TIMOPTIC) 0.5 % ophthalmic solution Place 1 drop into both eyes in the morning. 04/04/19   [provider]    Physical Exam: Vitals:   02/12/21 1400 02/12/21 1439 02/12/21 1523 02/12/21 1543  BP: 112/60 110/67 136/64 122/60  Pulse:  (!) 104 100 99  Resp: 14 18 (!) 23 (!) 21  Temp:  97.9 F (36.6 C) 97.8 F (36.6 C) 97.7 F (36.5 C)  TempSrc:  Oral Oral   SpO2:  100% 100%   Weight:      Height:         . General:  Appears calm and comfortable and is in NAD . Eyes:  PERRL, EOMI, normal lids, iris . ENT:  grossly normal hearing, lips & tongue, mmm;poor dentition . Neck:  no LAD, masses or thyromegaly . Cardiovascular:  RRR, no m/r/g. No LE edema.  [name] Respiratory:   CTA bilaterally with no wheezes/rales/rhonchi.  Normal respiratory effort. . Abdomen:  soft, NT, ND, NABS . Skin:  no rash or induration seen on limited exam . Musculoskeletal:  grossly normal tone BUE/BLE, good ROM, no bony abnormality . Psychiatric:  blunted mood and affect,  speech fluent and appropriate, AOx3 . Neurologic:  CN 2-12 grossly intact, moves all extremities in coordinated fashion    Radiological Exams on Admission: Independently reviewed - see discussion in A/P where applicable  No results found.  EKG: pending   Labs on Admission: I have personally reviewed the available labs and imaging studies at the time of the admission.  Pertinent labs:   Glucose 124 BUN 77/Creatinine 6.76/GFR 8 Albumin 2.9 WBC 5.9 Hgb 5.9; 9.3 on 4/5 Heme positive   Assessment/Plan Principal Problem:   Acute upper GI bleeding Active Problems:   Human immunodeficiency virus (HIV) disease ([place])   Mixed hyperlipidemia   TOBACCO ABUSE   Essential hypertension   Coronary artery disease involving native heart without angina pectoris   Chronic combined systolic (congestive) and  diastolic (congestive) heart failure (HCC)   Type 2 diabetes mellitus with diabetic polyneuropathy, with long-term current use of insulin (HCC)   ESRD on hemodialysis (HCC)    Recurrent UGI Bleeding -Patient' is presenting with melena, suggestive of recurrent upper GI bleeding. -Patient has history of AVMs, most recently treated with APC 2-3 weeks ago -Most likely diagnosis is recurrent AVM associated with resumption of ASA -The patient is not tachycardic with mildly low blood pressure, suggesting subacute volume loss.  -Will admit to progressive bed -GI consulted by ED, will follow up recommendations -NPO after MN for possible EGD/push enteroscopy tomorrow (clear liquids today) -Start IV pantoprazole 40 BID  -Zofran IV for nausea -Avoid NSAIDs and SQ heparin -Maintain IV access (2 large bore IVs if possible). -Hold ASA for now  ABLA -Patient with recurrent anemia secondary to upper GI bleeding.  -His Hgb decreased from 9.3 to 5.9 today.  -Type and screen were done in ED.  -Two units of blood were ordered by ED.  -Monitor closely and follow Hgb as per GI, transfuse as necessary for Hbg <7   ESRD on HD -Patient on chronic MWF HD; he only completed 40 minutes of HD at today's session -Nephrology prn order set utilized -He does not appear to be volume overloaded or otherwise in need of acute HD -Nephrology notified that patient will need HD -Continue Allopurinol for gout -Continue Sensipar, Rena-vit  HIV -CD4 count 403 in 06/2020 -Continue Biktarvy  HTN -Continue Coreg - only takes once daily but very low dose and needs to be dosed BID so will increase for now  CAD -PCI in 11/2019 -Stopped Brilinta previously due to recurrent bleeds -May need to also stop ASA for the same reason -Continue Imdur  Chronic combined systolic and diastolic heart failure  -Echo in 09/2020 with EF 40-45% -Appears compensated at this time despite only finishing a portion of HD today -Volume  management via HD  COPD -Continue Anoro  DM -Recent A1c 4.6, indicating probable need for NO treatment -He is only maintained on SSI -Will start very sensitive-scale SSI for now but consider stopping treatment at this time -Continue Neurontin  Glaucoma -Continue Xalatan, Timoptic  Tobacco dependence -Encourage cessation.   -This was discussed with the patient and should be reviewed on an ongoing basis.   -Patch ordered  H/o polysubstance abuse -Reports no ETOH use in weeks to months -Also reports no cocaine use but appears to be less certain about timing of last use -Ongoing cessation efforts should be encouraged -UDS pending     Note: This patient has been tested and is pending for the novel coronavirus COVID-19. He has been fully vaccinated against COVID-19.     DVT  prophylaxis: SCDs Code Status:  Full - confirmed with patient Family Communication: None present.  Disposition Plan:  The patient is from: home  Anticipated d/c is to: home without [facility] services   Anticipated d/c date will depend on clinical response to treatment, likely 2-3 days  Patient is currently: acutely ill Consults called: GI Admission status: Admit - It is my clinical opinion that admission to [facility] is reasonable and necessary because of the expectation that this patient will require hospital care that crosses at least 2 midnights to treat this condition based on the medical complexity of the problems presented.  Given the aforementioned information, the predictability of an adverse outcome is felt to be significant.     Karmen Bongo MD [facility]   How to contact the [facility] Attending or Consulting provider [place] or covering provider during after hours [place], for this patient?  1. Check the care team in [facility] and look for a) attending/consulting [facility] provider listed and b) the [facility] team listed 2. Log into [email or web address] and use [place]'s universal password to access. If you do not  have the password, please contact the hospital operator. 3. Locate the [facility] provider you are looking for under [facility] and page to a number that you can be directly reached. 4. If you still have difficulty reaching the provider, please page the [facility] (Director on Call) for the Hospitalists listed on amion for assistance.   02/12/2021, 4:26 PM

## 2021-02-12 NOTE — H&P (View-Only) (Signed)
Consultation  Referring Provider: Dr. Maryan Rued     Primary Care Physician:  [name], NP Primary Gastroenterologist: Dr. Fuller Plan      Reason for Consultation: GI bleed            HPI:   Jacob [name] is a 70 y.o. male with a past medical history of ESRD on HD Monday Wednesday Friday, HIV, COPD, diabetes, CAD and recurrent GI bleed secondary to AVM, who presented to the ER today with a complaint of increased fatigue and melena.    01/24/2021 consult by our service for anemia and a GI bleed.  At that time underwent small bowel endoscopy as below with duodenal AVMs treated with APC.  01/25/2021 biopsies came back with chronic active gastritis positive for H. pylori.  Patient was started on Flagyl 250 4 times daily x10 days, bismuth subsalicylate, doxycycline and omeprazole.  Is recommended he have a urea breath test in 4 weeks to confirm eradication.    Today, the patient tells me that he was doing well, but was on antibiotics (he was not sure what for but after review of notes it looks like he was positive for H. pylori at time of last enteroscopy) and these are somewhat hard for him to take.  Apparently he finished these on Monday, 02/08/2021.  Explains that this morning he got up around 4 and had a hard-boiled egg and felt fine until he went to sit out for the bus at 5:30 in the morning and felt like he needed to have a bowel movement.  When he got to dialysis he proceeded to have a dark tarry stool.  Per nursing he has had 3 more since being here.  Patient is very frustrated that this keeps happening again and again" I am going to bleed out".  Tells me this seems to be worse than any other prior episode he has had.  When he got to dialysis they could not keep his pressure up and so he came to the ER.  Associated symptoms include increased fatigue.    Denies fever, chills, abdominal pain or symptoms that awaken him from sleep.     GI history: 01/25/2021 small bowel endoscopy Dr. Loletha Carrow:  Gastritis and a few bleeding angioectasias in the duodenum treated with APC, nodular mucosa in the duodenal bulb and first portion of the duodenum  01/09/2021 EGD for GI bleed 1. Bleeding gastric AVMs status post successful APC therapy 2. Otherwise unremarkable upper endoscopy.   12/22/2020 EGD for GI bleed - Blood in the duodenal bulb. - Nodule found in the duodenum. steady non-pulsatile bleeding seen at base, very close to distal aspect pylorus so positioning difficult - ? AVM Treated with argon plasma coagulation (APC) - partially successful Clips (MR conditional) were placed. hemostatic spray applied. No bleeding at end - The examination was otherwise normal.   10/19/20 EGD for GI bleed Gastric AVMs s/p APC treatment. - No specimens collected   12/22/19 EGD --bleeding gastric AVMs   April 2020 EGD --Gastric and duodenal bulb AVMs   Dec 2013 screening colonoscopy --moderate diverticulosis --2 mm polyp ( removed but not retrieved)   Past Medical History:  Diagnosis Date  . Acute respiratory failure ([place]) 03/01/2018  . Arthritis    "all over; mostly knees and back" (02/28/2018)  . Chronic lower back pain    stenosis  . Community acquired pneumonia 09/06/2013  . COPD (chronic obstructive pulmonary disease) ([place])   . Coronary atherosclerosis of native coronary  artery    a. 02/2003 s/p CABG x 2 (VG->RI, VG->RPDA; b. 11/2019 PCI: LM nl, LAD 90d, D3 50, RI 100, LCX 100p, OM3 100 - fills via L->L collats from D2/dLAD, RCA 100p, VG->RPDA ok, VG->RI 95 (3.5x48 Synergy XD DES).  . Drug abuse ([place])    hx; tested for cocaine as recently as 2/08. says he is not using drugs now - avoided defib. for this reason   . ESRD (end stage renal disease) ([place])    Hemo M-W-F- Richarda Blade  . Fall at home 10/2020  . GERD (gastroesophageal reflux disease)    takes OTC meds as needed  . GI bleeding    a. 11/2019 EGD: angiodysplastic lesions w/ bleeding s/p argon plasma/clipping/epi inj.  . Glaucoma    uses  eye drops daily  . Hepatitis B 1968   "tx'd w/isolation; caught it from toilet stools in gym"  . HFrEF (heart failure with reduced ejection fraction) ([place])    a. 01/2019 Echo: EF 40-45%, diffuse HK, mild basal septal hypertrophy.  . History of blood transfusion 03/01/2019  . History of colon polyps    benign  . History of gout    takes Allopurinol daily as well as Colchicine-if needed (02/28/2018)  . History of kidney stones   . HTN (hypertension)    takes Coreg,Imdur.and Apresoline daily  . Human immunodeficiency virus (HIV) disease ([place]) dx'd 56   on [place] as of 12/2020.    [name] Hyperlipidemia    takes Atorvastatin daily  . Ischemic cardiomyopathy    a. 01/2019 Echo: EF 40-45%, diffuse HK, mild basal septal hypertrophy. Diast dysfxn. Nl RV size/fxn. Sev dil LA. Triv MR/TR/PR.  [name] Muscle spasm    takes Zanaflex as needed  . Myocardial infarction ([place]) ~ 2004/2005  . Nocturia   . Peripheral neuropathy    takes gabapentin daily  . Pneumonia    "at least twice" (02/28/2018)  . Syphilis, unspecified   . Type II diabetes mellitus ([place]) 2004   Lantus daily.Average fasting blood sugar 125-199  . Wears glasses   . Wears partial dentures     Past Surgical History:  Procedure Laterality Date  . AV FISTULA PLACEMENT Left 08/02/2018   Procedure: ARTERIOVENOUS (AV) FISTULA CREATION  left arm radiocephlic;  Surgeon: Marty Heck, MD;  Location: [place];  Service: Vascular;  Laterality: Left;  . AV FISTULA PLACEMENT Left 08/01/2019   Procedure: LEFT BRACHIOCEPHALIC ARTERIOVENOUS (AV) FISTULA CREATION;  Surgeon: [name], MD;  Location: [place];  Service: Vascular;  Laterality: Left;  . BASCILIC VEIN TRANSPOSITION Left 10/03/2019   Procedure: BASILIC VEIN TRANSPOSITION LEFT SECOND STAGE;  Surgeon: [name], MD;  Location: [place];  Service: Vascular;  Laterality: Left;  . BIOPSY  01/25/2021   Procedure: BIOPSY;  Surgeon: Doran Stabler, MD;  Location: [facility] ENDOSCOPY;  Service:  Gastroenterology;;  . CARDIAC CATHETERIZATION  10/2002; 12/19/2004   [name] 03/08/2011  . COLONOSCOPY  2013   Dr.John Henrene Pastor   . CORONARY ARTERY BYPASS GRAFT  02/24/2003   CABG X2/notes 03/08/2011  . CORONARY STENT INTERVENTION N/A 12/19/2019   Procedure: CORONARY STENT INTERVENTION;  Surgeon: Jettie Booze, MD;  Location: [place] CV LAB;  Service: Cardiovascular;  Laterality: N/A;  . ENTEROSCOPY N/A 01/25/2021   Procedure: ENTEROSCOPY;  Surgeon: Doran Stabler, MD;  Location: [place];  Service: Gastroenterology;  Laterality: N/A;  . ESOPHAGOGASTRODUODENOSCOPY (EGD) WITH PROPOFOL N/A 02/08/2019   Procedure: ESOPHAGOGASTRODUODENOSCOPY (EGD) WITH PROPOFOL;  Surgeon:  Milus Banister, MD;  Location: [facility] ENDOSCOPY;  Service: Gastroenterology;  Laterality: N/A;  . ESOPHAGOGASTRODUODENOSCOPY (EGD) WITH PROPOFOL N/A 12/22/2019   Procedure: ESOPHAGOGASTRODUODENOSCOPY (EGD) WITH PROPOFOL;  Surgeon: Lavena Bullion, DO;  Location: [place];  Service: Gastroenterology;  Laterality: N/A;  . ESOPHAGOGASTRODUODENOSCOPY (EGD) WITH PROPOFOL N/A 10/19/2020   Procedure: ESOPHAGOGASTRODUODENOSCOPY (EGD) WITH PROPOFOL;  Surgeon: Jackquline Denmark, MD;  Location: [facility] ENDOSCOPY;  Service: Endoscopy;  Laterality: N/A;  . ESOPHAGOGASTRODUODENOSCOPY (EGD) WITH PROPOFOL N/A 12/22/2020   Procedure: ESOPHAGOGASTRODUODENOSCOPY (EGD) WITH PROPOFOL;  Surgeon: Gatha Mayer, MD;  Location: [place];  Service: Endoscopy;  Laterality: N/A;  . ESOPHAGOGASTRODUODENOSCOPY (EGD) WITH PROPOFOL N/A 01/09/2021   Procedure: ESOPHAGOGASTRODUODENOSCOPY (EGD) WITH PROPOFOL;  Surgeon: Irene Shipper, MD;  Location: [facility] ENDOSCOPY;  Service: Endoscopy;  Laterality: N/A;  . HEMOSTASIS CLIP PLACEMENT  12/22/2019   Procedure: HEMOSTASIS CLIP PLACEMENT;  Surgeon: Lavena Bullion, DO;  Location: [place] ENDOSCOPY;  Service: Gastroenterology;;  . HEMOSTASIS CLIP PLACEMENT  12/22/2020   Procedure: HEMOSTASIS CLIP PLACEMENT;  Surgeon:  Gatha Mayer, MD;  Location: [facility] ENDOSCOPY;  Service: Endoscopy;;  . HEMOSTASIS CONTROL  12/22/2020   Procedure: HEMOSTASIS CONTROL/hemospray;  Surgeon: Gatha Mayer, MD;  Location: [place];  Service: Endoscopy;;  . HOT HEMOSTASIS N/A 02/08/2019   Procedure: HOT HEMOSTASIS (ARGON PLASMA COAGULATION/BICAP);  Surgeon: Milus Banister, MD;  Location: [facility] ENDOSCOPY;  Service: Gastroenterology;  Laterality: N/A;  . HOT HEMOSTASIS N/A 12/22/2019   Procedure: HOT HEMOSTASIS (ARGON PLASMA COAGULATION/BICAP);  Surgeon: Lavena Bullion, DO;  Location: [facility] ENDOSCOPY;  Service: Gastroenterology;  Laterality: N/A;  . HOT HEMOSTASIS N/A 10/19/2020   Procedure: HOT HEMOSTASIS (ARGON PLASMA COAGULATION/BICAP);  Surgeon: Jackquline Denmark, MD;  Location: [facility] ENDOSCOPY;  Service: Endoscopy;  Laterality: N/A;  . HOT HEMOSTASIS N/A 12/22/2020   Procedure: HOT HEMOSTASIS (ARGON PLASMA COAGULATION/BICAP);  Surgeon: Gatha Mayer, MD;  Location: [facility] ENDOSCOPY;  Service: Endoscopy;  Laterality: N/A;  . HOT HEMOSTASIS N/A 01/09/2021   Procedure: HOT HEMOSTASIS (ARGON PLASMA COAGULATION/BICAP);  Surgeon: Irene Shipper, MD;  Location: [facility] ENDOSCOPY;  Service: Endoscopy;  Laterality: N/A;  . HOT HEMOSTASIS N/A 01/25/2021   Procedure: HOT HEMOSTASIS (ARGON PLASMA COAGULATION/BICAP);  Surgeon: Doran Stabler, MD;  Location: [place];  Service: Gastroenterology;  Laterality: N/A;  . INTERTROCHANTERIC HIP FRACTURE SURGERY Left 11/2006   [name] 03/08/2011  . INTRAVASCULAR ULTRASOUND/IVUS N/A 12/19/2019   Procedure: Intravascular Ultrasound/IVUS;  Surgeon: Jettie Booze, MD;  Location: [place] CV LAB;  Service: Cardiovascular;  Laterality: N/A;  . IR FLUORO GUIDE CV LINE RIGHT  07/24/2019  . IR FLUORO GUIDE CV LINE RIGHT  07/30/2019  . IR US GUIDE VASC ACCESS RIGHT  07/24/2019  . IR US GUIDE VASC ACCESS RIGHT  07/30/2019  . LAPAROSCOPIC CHOLECYSTECTOMY  05/2006  . LIGATION OF COMPETING BRANCHES OF ARTERIOVENOUS  FISTULA Left 11/05/2018   Procedure: LIGATION OF COMPETING BRANCHES OF ARTERIOVENOUS FISTULA  LEFT  ARM;  Surgeon: Marty Heck, MD;  Location: [place];  Service: Vascular;  Laterality: Left;  . LUMBAR LAMINECTOMY/DECOMPRESSION MICRODISCECTOMY N/A 02/29/2016   Procedure: Left L4-5 Lateral Recess Decompression, Removal Extradural Intraspinal Facet Cyst;  Surgeon: Marybelle Killings, MD;  Location: [place];  Service: Orthopedics;  Laterality: N/A;  . MULTIPLE TOOTH EXTRACTIONS    . ORIF MANDIBULAR FRACTURE Left 08/13/2004   ORIF of left body fracture mandible with KLS Martin 2.3-mm six hole/notes 03/08/2011  . RIGHT/LEFT HEART CATH AND CORONARY/GRAFT ANGIOGRAPHY N/A 12/19/2019  Procedure: RIGHT/LEFT HEART CATH AND CORONARY/GRAFT ANGIOGRAPHY;  Surgeon: Jettie Booze, MD;  Location: [place] CV LAB;  Service: Cardiovascular;  Laterality: N/A;  . SCLEROTHERAPY  12/22/2019   Procedure: SCLEROTHERAPY;  Surgeon: Lavena Bullion, DO;  Location: [facility] ENDOSCOPY;  Service: Gastroenterology;;    Family History  Problem Relation Age of Onset  . Heart failure Father   . Hypertension Father   . Diabetes Brother   . Heart attack Brother   . Alzheimer's disease Mother   . Stroke Sister   . Diabetes Sister   . Alzheimer's disease Sister   . Hypertension Brother   . Diabetes Brother   . Drug abuse Brother   . Colon cancer Neg Hx     Social History   Tobacco Use  . Smoking status: Current Every Day Smoker    Packs/day: 0.50    Years: 43.00    Pack years: 21.50    Types: Cigarettes  . Smokeless tobacco: Never Used  Vaping Use  . Vaping Use: Never used  Substance Use Topics  . Alcohol use: Not Currently    Alcohol/week: 12.0 standard drinks    Types: 12 Standard drinks or equivalent per week    Comment: occassional  . Drug use: Not Currently    Types: Cocaine    Comment: hx of crack/cocaine 66yrs ago 10/01/2019- none    Prior to Admission medications   Medication Sig Start Date End Date  Taking? Authorizing Provider  acetaminophen (TYLENOL) 650 MG CR tablet Take 1,300 mg by mouth 3 (three) times daily.    [provider]  allopurinol (ZYLOPRIM) 100 MG tablet TAKE 1 TABLET(100 MG) BY MOUTH DAILY Patient taking differently: Take 100 mg by mouth daily. 01/12/21   [name], Dinah C, NP  ANORO ELLIPTA 62.5-25 MCG/INH AEPB INHALE 1 PUFF BY MOUTH EVERY DAY Patient taking differently: Inhale 1 puff into the lungs daily. 12/21/20   [name], NP  aspirin EC 81 MG tablet Take 1 tablet (81 mg total) by mouth in the morning. Swallow whole. 01/31/21   Ghimire, Henreitta Leber, MD  B-D UF III MINI PEN NEEDLES 31G X 5 MM MISC USE FOUR TIMES DAILY 03/02/20   [name], NP  bictegravir-emtricitabine-tenofovir AF (BIKTARVY) 50-200-25 MG TABS tablet Take 1 tablet by mouth daily. 06/30/20   [name], MD  Calcium Acetate 667 MG TABS Take 305 506 1469 mg by mouth See admin instructions. Take 1,334 mg by mouth three times a day with meals and 667 mg with each snack 01/20/21   [provider]  carvedilol (COREG) 3.125 MG tablet Take 1 tablet (3.125 mg total) by mouth 2 (two) times daily. 02/13/20   [name], MD  Cinacalcet HCl (SENSIPAR PO) Take 1 tablet by mouth 3 (three) times a week. At dialysis 08/14/20 08/13/21  [provider]  colchicine 0.6 MG tablet Take 0.6-1.2 mg by mouth daily as needed (as directed for gout flares).    [provider]  diclofenac Sodium (VOLTAREN) 1 % GEL APPLY 2 GRAMS TO THE AFFECTED AREA THREE TIMES DAILY AS NEEDED FOR PAIN Patient taking differently: Apply 2 g topically 3 (three) times daily as needed (for pain). 01/12/21   [name], Dinah C, NP  folic acid (FOLVITE) 1 MG tablet Take 1 tablet (1 mg total) by mouth daily. 01/12/21   [name], Dinah C, NP  gabapentin (NEURONTIN) 300 MG capsule Take 1 capsule (300 mg total) by mouth daily. Dose change due to renal function Patient taking  differently: Take 300 mg by mouth daily.  12/09/20   [name], NP  insulin lispro (HUMALOG) 100 UNIT/ML KwikPen INJECT 20 UNITS UNDER THE SKIN THREE TIMES DAILY AS NEEDED FOR HIGH BLOOD SUGAR(ABOVE 150) 02/01/21   [name], NP  iron sucrose in sodium chloride 0.9 % 100 mL Iron Sucrose (Venofer) 07/08/20 07/07/21  [provider]  isosorbide mononitrate (IMDUR) 30 MG 24 hr tablet TAKE 1 TABLET BY MOUTH EVERY DAY Patient taking differently: Take 30 mg by mouth daily. 01/12/21   [name], Dinah C, NP  Lancets (ONETOUCH DELICA PLUS [id number]) MISC Inject 1 Device as directed 3 (three) times daily. Dx: E11.40 09/18/19   [name], NP  latanoprost (XALATAN) 0.005 % ophthalmic solution Place 1 drop into both eyes at bedtime.    [provider]  lidocaine-prilocaine (EMLA) cream Apply 1 application topically every Monday, Wednesday, and Friday with hemodialysis.    [provider]  Methoxy PEG-Epoetin Beta (MIRCERA IJ) Mircera 12/25/20   [provider]  multivitamin (RENA-VIT) TABS tablet Take 1 tablet by mouth daily.    [provider]  nitroGLYCERIN (NITROSTAT) 0.3 MG SL tablet ONE TABLET UNDER TONGUE AS NEEDED FOR CHEST PAIN Patient taking differently: Place 0.3 mg under the tongue as needed for chest pain. 05/01/19   [name], MD  ONETOUCH ULTRA test strip CHECK BLOOD SUGAR 3 TIMES  DAILY Patient taking differently: 1 each by Other route 3 (three) times daily. 05/27/20   [name], NP  pantoprazole (PROTONIX) 40 MG tablet TAKE 1 TABLET(40 MG) BY MOUTH TWICE DAILY Patient taking differently: Take 40 mg by mouth 2 (two) times daily before a meal. 01/12/21   [name], Dinah C, NP  polyethylene glycol (MIRALAX / GLYCOLAX) 17 g packet Take 17 g by mouth daily as needed for mild constipation. Patient taking differently: Take 17 g by mouth daily as needed for mild constipation (MIX AND DRINK). 11/07/20   Aline August, MD  timolol (TIMOPTIC) 0.5 % ophthalmic solution Place 1  drop into both eyes in the morning. 04/04/19   [provider]    Current Facility-Administered Medications  Medication Dose Route Frequency Provider Last Rate Last Admin  . 0.9 %  sodium chloride infusion (Manually program via Guardrails IV Fluids)   Intravenous Once Blanchie Dessert, MD      . pantoprazole (PROTONIX) injection 40 mg  40 mg Intravenous Once Blanchie Dessert, MD       Current Outpatient Medications  Medication Sig Dispense Refill  . acetaminophen (TYLENOL) 650 MG CR tablet Take 1,300 mg by mouth 3 (three) times daily.    [name] allopurinol (ZYLOPRIM) 100 MG tablet TAKE 1 TABLET(100 MG) BY MOUTH DAILY (Patient taking differently: Take 100 mg by mouth daily.) 90 tablet 1  . ANORO ELLIPTA 62.5-25 MCG/INH AEPB INHALE 1 PUFF BY MOUTH EVERY DAY (Patient taking differently: Inhale 1 puff into the lungs daily.) 60 each 5  . aspirin EC 81 MG tablet Take 1 tablet (81 mg total) by mouth in the morning. Swallow whole. 30 tablet 11  . B-D UF III MINI PEN NEEDLES 31G X 5 MM MISC USE FOUR TIMES DAILY 100 each 11  . bictegravir-emtricitabine-tenofovir AF (BIKTARVY) 50-200-25 MG TABS tablet Take 1 tablet by mouth daily. 30 tablet 11  . Calcium Acetate 667 MG TABS Take 667-1,334 mg by mouth See admin instructions. Take 1,334 mg by mouth three times a day with meals and 667 mg with each snack    .  carvedilol (COREG) 3.125 MG tablet Take 1 tablet (3.125 mg total) by mouth 2 (two) times daily. 180 tablet 3  . Cinacalcet HCl (SENSIPAR PO) Take 1 tablet by mouth 3 (three) times a week. At dialysis    . colchicine 0.6 MG tablet Take 0.6-1.2 mg by mouth daily as needed (as directed for gout flares).    . diclofenac Sodium (VOLTAREN) 1 % GEL APPLY 2 GRAMS TO THE AFFECTED AREA THREE TIMES DAILY AS NEEDED FOR PAIN (Patient taking differently: Apply 2 g topically 3 (three) times daily as needed (for pain).) 267 g 0  . folic acid (FOLVITE) 1 MG tablet Take 1 tablet (1 mg total) by mouth daily. 90 tablet  1  . gabapentin (NEURONTIN) 300 MG capsule Take 1 capsule (300 mg total) by mouth daily. Dose change due to renal function (Patient taking differently: Take 300 mg by mouth daily.) 90 capsule 1  . insulin lispro (HUMALOG) 100 UNIT/ML KwikPen INJECT 20 UNITS UNDER THE SKIN THREE TIMES DAILY AS NEEDED FOR HIGH BLOOD SUGAR(ABOVE 150) 45 mL 1  . iron sucrose in sodium chloride 0.9 % 100 mL Iron Sucrose (Venofer)    . isosorbide mononitrate (IMDUR) 30 MG 24 hr tablet TAKE 1 TABLET BY MOUTH EVERY DAY (Patient taking differently: Take 30 mg by mouth daily.) 90 tablet 1  . Lancets (ONETOUCH DELICA PLUS [id number]) MISC Inject 1 Device as directed 3 (three) times daily. Dx: E11.40 300 each 3  . latanoprost (XALATAN) 0.005 % ophthalmic solution Place 1 drop into both eyes at bedtime.    . lidocaine-prilocaine (EMLA) cream Apply 1 application topically every Monday, Wednesday, and Friday with hemodialysis.    . Methoxy PEG-Epoetin Beta (MIRCERA IJ) Mircera    . multivitamin (RENA-VIT) TABS tablet Take 1 tablet by mouth daily.    . nitroGLYCERIN (NITROSTAT) 0.3 MG SL tablet ONE TABLET UNDER TONGUE AS NEEDED FOR CHEST PAIN (Patient taking differently: Place 0.3 mg under the tongue as needed for chest pain.) 25 tablet 4  . ONETOUCH ULTRA test strip CHECK BLOOD SUGAR 3 TIMES  DAILY (Patient taking differently: 1 each by Other route 3 (three) times daily.) 300 strip 3  . pantoprazole (PROTONIX) 40 MG tablet TAKE 1 TABLET(40 MG) BY MOUTH TWICE DAILY (Patient taking differently: Take 40 mg by mouth 2 (two) times daily before a meal.) 180 tablet 1  . polyethylene glycol (MIRALAX / GLYCOLAX) 17 g packet Take 17 g by mouth daily as needed for mild constipation. (Patient taking differently: Take 17 g by mouth daily as needed for mild constipation (MIX AND DRINK).) 14 each 0  . timolol (TIMOPTIC) 0.5 % ophthalmic solution Place 1 drop into both eyes in the morning.      Allergies as of 02/12/2021 - Review Complete  02/04/2021  Allergen Reaction Noted  . Augmentin [amoxicillin-pot clavulanate] Diarrhea and Other (See Comments) 12/05/2018  . Mucinex fast-max Other (See Comments) 11/30/2020  . Amphetamines Other (See Comments) 01/15/2012     Review of Systems:    Constitutional: No weight loss, fever or chills Skin: No rash  Cardiovascular: No chest pain Respiratory: No SOB  Gastrointestinal: See HPI and otherwise negative Genitourinary: No dysuria  Neurological: No headache, dizziness or syncope Musculoskeletal: No new muscle or joint pain Hematologic: No bruising Psychiatric: No history of depression or anxiety    Physical Exam:  Vital signs in last 24 hours: Temp:  [97.5 F (36.4 C)] 97.5 F (36.4 C) (04/22 0903) Pulse Rate:  [89-100] 100 (04/22 1000)  Resp:  [15-18] 15 (04/22 1000) BP: (96-99)/(42-56) 98/42 (04/22 1000) SpO2:  [96 %-100 %] 100 % (04/22 1000) Weight:  [78.5 kg] 78.5 kg (04/22 0903)   General:   Pleasant AA male appears to be in NAD, Well developed, Well nourished, alert and cooperative Head:  Normocephalic and atraumatic. Eyes:   PEERL, EOMI. No icterus. Conjunctiva pink. Ears:  Normal auditory acuity. Neck:  Supple Throat: Oral cavity and pharynx without inflammation, swelling or lesion.  Lungs: Respirations even and unlabored. Lungs clear to auscultation bilaterally.   No wheezes, crackles, or rhonchi.  Heart: Normal S1, S2. No MRG. Regular rate and rhythm. No peripheral edema, cyanosis or pallor.  Abdomen:  Soft, nondistended, nontender. No rebound or guarding. Normal bowel sounds. No appreciable masses or hepatomegaly. Rectal:  Not performed.  Msk:  Symmetrical without gross deformities. Peripheral pulses intact.  Extremities:  Without edema, no deformity or joint abnormality.  Neurologic:  Alert and  oriented x4;  grossly normal neurologically.  Skin:   Dry and intact without significant lesions or rashes. Psychiatric: Demonstrates good judgement and reason  without abnormal affect or behaviors.   LAB RESULTS: Recent Labs    02/12/21 0855  WBC 5.9  HGB 5.9*  HCT 20.0*  PLT 190   BMET Recent Labs    02/12/21 0855  NA 139  K 4.7  CL 99  CO2 28  GLUCOSE 124*  BUN 77*  CREATININE 6.76*  CALCIUM 8.8*   LFT Recent Labs    02/12/21 0855  PROT 6.3*  ALBUMIN 2.9*  AST 16  ALT 12  ALKPHOS 64  BILITOT 0.5      Impression / Plan:   Impression: 1.  Acute on chronic anemia: History of the same related to gastric/intestinal AVMs, hemoglobin 5.9 today (9.3 on 01/26/2021), for melenic movements today, no abdominal pain; most likely bleeding AVMs 2.  CAD/remote CABG/stent February 8119/[id number] systolic/diastolic heart failure 3.  ESRD on HD Monday Wednesday Friday 4.  HIV on therapy  Plan: 1.  We will plan for repeat enteroscopy tomorrow 4/23 with intervention as needed 2.  Transfuse to hemoglobin greater than 7.  Then continue to monitor hemoglobin every 8 hours with transfusion as needed. 3.  Continue Pantoprazole 40 mg twice daily 4.  Okay for clear liquids today then n.p.o. after midnight 5.  Could consider subcu octreotide in the future  6. **Of note COVID test pending at time of my exam** 7.  Please await any further recommendations from Dr. Silverio Decamp later today  Thank you for your kind consultation, we will continue to follow.  Lavone Nian [facility]  02/12/2021, 11:03 AM   Attending physician's note   I have taken a history, examined the patient and reviewed the chart. I agree with the Advanced Practitioner's note, impression and recommendations.  70 year old male with history of end-stage renal disease on hemodialysis, HIV, COPD, diabetes, CAD with known gastric and duodenal AVMs admitted with symptomatic anemia and melena  His presentation is concerning for recurrent GI bleed from AVM's We will plan for push enteroscopy tomorrow with APC  He will need close monitoring of H&H weekly during dialysis, IV iron infusion  and darbepoetin as needed He may also benefit from subcutaneous Sandostatin for management of AVMs Follow-up with hematology  The risks and benefits as well as alternatives of endoscopic procedure(s) have been discussed and reviewed. All questions answered. The patient agrees to proceed.    Damaris Hippo , MD [phone]

## 2021-02-12 NOTE — ED Triage Notes (Signed)
Patient presents to ed via [facility] states he was fine when he went to bed last pm woke up at 3am had normal BM this am approx. 6 was at dialysis went to bathroom and had a dark stool, c/o abd. Cramping after BM cramping goes away. Had BM in ED was dark. States his blood pressure was down at dialysis today and he was only able to stay on dialysis 40 mins. Left arm restricted.

## 2021-02-12 NOTE — ED Provider Notes (Signed)
[facility] EMERGENCY DEPARTMENT Provider Note   CSN: [id number] Arrival date & time: 02/12/21  4098     History No chief complaint on file.   Jacob [name] is a 70 y.o. male.  Pt is a 70y/o male with hx of ESRD on HD MWF, HIV, COPD,DM, CAD and recurrent GI bleed secondary to AVM.  Patient had AVMs cauterized in December 2021, EGD done on 3/1 showed duodenal bulb nodule with steady nonpulsatile bleeding and was treated with APC and clipping x4, but has returned for recurrent bleeding multiple times in the last 2 months most recently on 01/24/2021.  He had EGD that showed severe inflammation in the gastric antrum and had multiple biopsies done as well as a few angiectasia's with bleeding in the duodenal bulb-s/p APC.  Patient had held aspirin for 5 days after his discharge, received an iron transfusion on 4/13 and has been having his hemoglobin followed at dialysis.  He did complete a course of doxycycline and Flagyl and finished a few days.  He is also resumed his aspirin.  He noticed on Monday that he was feeling more fatigued when walking out to the bus and going up and down his apartment stairs.  That has consistently progressed and today he noticed a large black stool.  He reports his stool has gone back to being brown until this morning.  He would get severe abdominal cramping across the middle of his abdomen and then had a large bowel movement.  He was at dialysis when this occurred and reports that his blood pressure kept dropping at dialysis.  He then had a second large black stool here which the nurse noted is black but nonbloody.  His last hemoglobin he believes was checked last week and was in the 8 range from 9.6 the week before.  He currently denies any abdominal pain after having the large bowel movement.  He denies any hematemesis or any plain nausea or vomiting.  He has been eating a normal diet.  He has been taking all medications as prescribed.  He denies feeling  short of breath at rest but does note worsening exertional dyspnea.  He denies any chest pain.        Past Medical History:  Diagnosis Date  . Acute respiratory failure ([place]) 03/01/2018  . Arthritis    "all over; mostly knees and back" (02/28/2018)  . Chronic lower back pain    stenosis  . Community acquired pneumonia 09/06/2013  . COPD (chronic obstructive pulmonary disease) ([place])   . Coronary atherosclerosis of native coronary artery    a. 02/2003 s/p CABG x 2 (VG->RI, VG->RPDA; b. 11/2019 PCI: LM nl, LAD 90d, D3 50, RI 100, LCX 100p, OM3 100 - fills via L->L collats from D2/dLAD, RCA 100p, VG->RPDA ok, VG->RI 95 (3.5x48 Synergy XD DES).  . Drug abuse ([place])    hx; tested for cocaine as recently as 2/08. says he is not using drugs now - avoided defib. for this reason   . ESRD (end stage renal disease) ([place])    Hemo M-W-F- Richarda Blade  . Fall at home 10/2020  . GERD (gastroesophageal reflux disease)    takes OTC meds as needed  . GI bleeding    a. 11/2019 EGD: angiodysplastic lesions w/ bleeding s/p argon plasma/clipping/epi inj.  . Glaucoma    uses eye drops daily  . Hepatitis B 1968   "tx'd w/isolation; caught it from toilet stools in gym"  . HFrEF (heart failure  with reduced ejection fraction) ([place])    a. 01/2019 Echo: EF 40-45%, diffuse HK, mild basal septal hypertrophy.  . History of blood transfusion 03/01/2019  . History of colon polyps    benign  . History of gout    takes Allopurinol daily as well as Colchicine-if needed (02/28/2018)  . History of kidney stones   . HTN (hypertension)    takes Coreg,Imdur.and Apresoline daily  . Human immunodeficiency virus (HIV) disease ([place]) dx'd 14   on [place] as of 12/2020.    [name] Hyperlipidemia    takes Atorvastatin daily  . Ischemic cardiomyopathy    a. 01/2019 Echo: EF 40-45%, diffuse HK, mild basal septal hypertrophy. Diast dysfxn. Nl RV size/fxn. Sev dil LA. Triv MR/TR/PR.  [name] Muscle spasm    takes Zanaflex as needed  . Myocardial  infarction ([place]) ~ 2004/2005  . Nocturia   . Peripheral neuropathy    takes gabapentin daily  . Pneumonia    "at least twice" (02/28/2018)  . Syphilis, unspecified   . Type II diabetes mellitus ([place]) 2004   Lantus daily.Average fasting blood sugar 125-199  . Wears glasses   . Wears partial dentures     Patient Active Problem List   Diagnosis Date Noted  . Gastric hemorrhage due to angiodysplasia of stomach   . GI bleed 11/24/2020  . COVID-19 virus infection 11/24/2020  . Other mechanical complication of cranial or spinal infusion catheter, sequela 11/11/2020  . Subdural hematoma ([place]) 11/06/2020  . Fall at home, initial encounter 11/05/2020  . Nicotine dependence, cigarettes, uncomplicated 62/22/9798  . Alcohol abuse 11/05/2020  . GERD without esophagitis 11/05/2020  . Unspecified trochanteric fracture of left femur, initial encounter for closed fracture ([place]) 11/05/2020  . Traumatic subdural hematoma, initial encounter ([place]) 11/05/2020  . Fracture of metatarsal of right foot, closed 11/05/2020  . Nondisplaced fracture of greater trochanter of left femur, initial encounter for closed fracture ([place])   . Uremia   . AVM (arteriovenous malformation) of small bowel, acquired with hemorrhage   . Fluid overload 10/16/2020  . Pruritus, unspecified 10/02/2020  . Encounter for removal of sutures 03/05/2020  . Cardiomyopathy ([place]) 12/21/2019  . Iron deficiency anemia   . Acute blood loss anemia   . Acute on chronic anemia   . AVM (arteriovenous malformation) of duodenum, acquired   . Abnormal nuclear stress test 12/19/2019  . Pain, unspecified 10/04/2019  . Type 2 diabetes mellitus with diabetic neuropathy, unspecified ([place]) 08/07/2019  . Allergy, unspecified, initial encounter 08/02/2019  . Complication of vascular dialysis catheter 08/02/2019  . Drug abuse counseling and surveillance of drug abuser 08/02/2019  . Other specified coagulation defects ([place]) 08/02/2019  . Secondary  hyperparathyroidism of renal origin ([place]) 08/02/2019  . Unspecified atrial fibrillation ([place]) 08/02/2019  . ESRD on hemodialysis ([place])   . Hand pain, left   . Cocaine abuse ([place])   . Hypertensive emergency   . CHF exacerbation ([place]) 07/21/2019  . Diabetic foot ulcer ([place]) 02/11/2019  . AVM (arteriovenous malformation)   . Melena 02/07/2019  . CKD stage 4 due to type 2 diabetes mellitus ([place]) 05/30/2018  . Hyperlipidemia associated with type 2 diabetes mellitus ([place]) 05/30/2018  . Right foot ulcer ([place]) 02/28/2018  . Chronic obstructive pulmonary disease ([place]) 02/28/2018  . Anemia of chronic disease 11/20/2016  . CKD (chronic kidney disease) stage 3, GFR 30-59 ml/min (HCC) 11/20/2016  . CHF (congestive heart failure) ([place]) 11/10/2016  . History of lumbar laminectomy for spinal cord decompression 02/29/2016  .  Type 2 diabetes mellitus with diabetic polyneuropathy, with long-term current use of insulin ([place]) 06/17/2015  . HTN (hypertension) 04/26/2015  . DM (diabetes mellitus) ([place]) 04/26/2015  . Ischemic cardiomyopathy 05/12/2014  . Ulcer of lower extremity ([place]) 09/06/2013  . Hyperkalemia 09/06/2013  . Chest pain 09/06/2013  . Type II or unspecified type diabetes mellitus with unspecified complication, not stated as uncontrolled 10/22/2012  . Bunion of left foot 11/25/2011  . Bunion, right foot 11/25/2011  . Polysubstance abuse ([place]) 07/28/2011  . Hip fracture, left ([place]) 04/04/2011  . Closed fracture of neck of femur ([place]) 04/04/2011  . Insomnia 02/18/2011  . Coronary artery disease involving native heart without angina pectoris 04/20/2009  . Chronic combined systolic (congestive) and diastolic (congestive) heart failure ([place]) 04/20/2009  . Mixed hyperlipidemia 11/20/2006  . Gout 11/20/2006  . TOBACCO ABUSE 11/20/2006  . Essential hypertension 11/20/2006  . Human immunodeficiency virus (HIV) disease ([place]) 09/02/2006    Past Surgical History:  Procedure Laterality Date  . AV  FISTULA PLACEMENT Left 08/02/2018   Procedure: ARTERIOVENOUS (AV) FISTULA CREATION  left arm radiocephlic;  Surgeon: Marty Heck, MD;  Location: [place];  Service: Vascular;  Laterality: Left;  . AV FISTULA PLACEMENT Left 08/01/2019   Procedure: LEFT BRACHIOCEPHALIC ARTERIOVENOUS (AV) FISTULA CREATION;  Surgeon: [name], MD;  Location: [place];  Service: Vascular;  Laterality: Left;  . BASCILIC VEIN TRANSPOSITION Left 10/03/2019   Procedure: BASILIC VEIN TRANSPOSITION LEFT SECOND STAGE;  Surgeon: [name], MD;  Location: [place];  Service: Vascular;  Laterality: Left;  . BIOPSY  01/25/2021   Procedure: BIOPSY;  Surgeon: Doran Stabler, MD;  Location: [facility] ENDOSCOPY;  Service: Gastroenterology;;  . CARDIAC CATHETERIZATION  10/2002; 12/19/2004   [name] 03/08/2011  . COLONOSCOPY  2013   Dr.John Henrene Pastor   . CORONARY ARTERY BYPASS GRAFT  02/24/2003   CABG X2/notes 03/08/2011  . CORONARY STENT INTERVENTION N/A 12/19/2019   Procedure: CORONARY STENT INTERVENTION;  Surgeon: Jettie Booze, MD;  Location: [place] CV LAB;  Service: Cardiovascular;  Laterality: N/A;  . ENTEROSCOPY N/A 01/25/2021   Procedure: ENTEROSCOPY;  Surgeon: Doran Stabler, MD;  Location: [place];  Service: Gastroenterology;  Laterality: N/A;  . ESOPHAGOGASTRODUODENOSCOPY (EGD) WITH PROPOFOL N/A 02/08/2019   Procedure: ESOPHAGOGASTRODUODENOSCOPY (EGD) WITH PROPOFOL;  Surgeon: Milus Banister, MD;  Location: [place];  Service: Gastroenterology;  Laterality: N/A;  . ESOPHAGOGASTRODUODENOSCOPY (EGD) WITH PROPOFOL N/A 12/22/2019   Procedure: ESOPHAGOGASTRODUODENOSCOPY (EGD) WITH PROPOFOL;  Surgeon: Lavena Bullion, DO;  Location: [place];  Service: Gastroenterology;  Laterality: N/A;  . ESOPHAGOGASTRODUODENOSCOPY (EGD) WITH PROPOFOL N/A 10/19/2020   Procedure: ESOPHAGOGASTRODUODENOSCOPY (EGD) WITH PROPOFOL;  Surgeon: Jackquline Denmark, MD;  Location: [facility] ENDOSCOPY;  Service: Endoscopy;  Laterality: N/A;   . ESOPHAGOGASTRODUODENOSCOPY (EGD) WITH PROPOFOL N/A 12/22/2020   Procedure: ESOPHAGOGASTRODUODENOSCOPY (EGD) WITH PROPOFOL;  Surgeon: Gatha Mayer, MD;  Location: [place];  Service: Endoscopy;  Laterality: N/A;  . ESOPHAGOGASTRODUODENOSCOPY (EGD) WITH PROPOFOL N/A 01/09/2021   Procedure: ESOPHAGOGASTRODUODENOSCOPY (EGD) WITH PROPOFOL;  Surgeon: Irene Shipper, MD;  Location: [facility] ENDOSCOPY;  Service: Endoscopy;  Laterality: N/A;  . HEMOSTASIS CLIP PLACEMENT  12/22/2019   Procedure: HEMOSTASIS CLIP PLACEMENT;  Surgeon: Lavena Bullion, DO;  Location: [place] ENDOSCOPY;  Service: Gastroenterology;;  . HEMOSTASIS CLIP PLACEMENT  12/22/2020   Procedure: HEMOSTASIS CLIP PLACEMENT;  Surgeon: Gatha Mayer, MD;  Location: [place];  Service: Endoscopy;;  . HEMOSTASIS CONTROL  12/22/2020   Procedure: HEMOSTASIS CONTROL/hemospray;  Surgeon: Gatha Mayer, MD;  Location: [place];  Service: Endoscopy;;  . HOT HEMOSTASIS N/A 02/08/2019   Procedure: HOT HEMOSTASIS (ARGON PLASMA COAGULATION/BICAP);  Surgeon: Milus Banister, MD;  Location: [facility] ENDOSCOPY;  Service: Gastroenterology;  Laterality: N/A;  . HOT HEMOSTASIS N/A 12/22/2019   Procedure: HOT HEMOSTASIS (ARGON PLASMA COAGULATION/BICAP);  Surgeon: Lavena Bullion, DO;  Location: [facility] ENDOSCOPY;  Service: Gastroenterology;  Laterality: N/A;  . HOT HEMOSTASIS N/A 10/19/2020   Procedure: HOT HEMOSTASIS (ARGON PLASMA COAGULATION/BICAP);  Surgeon: Jackquline Denmark, MD;  Location: [facility] ENDOSCOPY;  Service: Endoscopy;  Laterality: N/A;  . HOT HEMOSTASIS N/A 12/22/2020   Procedure: HOT HEMOSTASIS (ARGON PLASMA COAGULATION/BICAP);  Surgeon: Gatha Mayer, MD;  Location: [facility] ENDOSCOPY;  Service: Endoscopy;  Laterality: N/A;  . HOT HEMOSTASIS N/A 01/09/2021   Procedure: HOT HEMOSTASIS (ARGON PLASMA COAGULATION/BICAP);  Surgeon: Irene Shipper, MD;  Location: [facility] ENDOSCOPY;  Service: Endoscopy;  Laterality: N/A;  . HOT HEMOSTASIS N/A 01/25/2021   Procedure: HOT  HEMOSTASIS (ARGON PLASMA COAGULATION/BICAP);  Surgeon: Doran Stabler, MD;  Location: [place];  Service: Gastroenterology;  Laterality: N/A;  . INTERTROCHANTERIC HIP FRACTURE SURGERY Left 11/2006   [name] 03/08/2011  . INTRAVASCULAR ULTRASOUND/IVUS N/A 12/19/2019   Procedure: Intravascular Ultrasound/IVUS;  Surgeon: Jettie Booze, MD;  Location: [place] CV LAB;  Service: Cardiovascular;  Laterality: N/A;  . IR FLUORO GUIDE CV LINE RIGHT  07/24/2019  . IR FLUORO GUIDE CV LINE RIGHT  07/30/2019  . IR US GUIDE VASC ACCESS RIGHT  07/24/2019  . IR US GUIDE VASC ACCESS RIGHT  07/30/2019  . LAPAROSCOPIC CHOLECYSTECTOMY  05/2006  . LIGATION OF COMPETING BRANCHES OF ARTERIOVENOUS FISTULA Left 11/05/2018   Procedure: LIGATION OF COMPETING BRANCHES OF ARTERIOVENOUS FISTULA  LEFT  ARM;  Surgeon: Marty Heck, MD;  Location: [place];  Service: Vascular;  Laterality: Left;  . LUMBAR LAMINECTOMY/DECOMPRESSION MICRODISCECTOMY N/A 02/29/2016   Procedure: Left L4-5 Lateral Recess Decompression, Removal Extradural Intraspinal Facet Cyst;  Surgeon: Marybelle Killings, MD;  Location: [place];  Service: Orthopedics;  Laterality: N/A;  . MULTIPLE TOOTH EXTRACTIONS    . ORIF MANDIBULAR FRACTURE Left 08/13/2004   ORIF of left body fracture mandible with KLS Martin 2.3-mm six hole/notes 03/08/2011  . RIGHT/LEFT HEART CATH AND CORONARY/GRAFT ANGIOGRAPHY N/A 12/19/2019   Procedure: RIGHT/LEFT HEART CATH AND CORONARY/GRAFT ANGIOGRAPHY;  Surgeon: Jettie Booze, MD;  Location: [place] CV LAB;  Service: Cardiovascular;  Laterality: N/A;  . SCLEROTHERAPY  12/22/2019   Procedure: SCLEROTHERAPY;  Surgeon: Lavena Bullion, DO;  Location: [facility] ENDOSCOPY;  Service: Gastroenterology;;       Family History  Problem Relation Age of Onset  . Heart failure Father   . Hypertension Father   . Diabetes Brother   . Heart attack Brother   . Alzheimer's disease Mother   . Stroke Sister   . Diabetes Sister   .  Alzheimer's disease Sister   . Hypertension Brother   . Diabetes Brother   . Drug abuse Brother   . Colon cancer Neg Hx     Social History   Tobacco Use  . Smoking status: Current Every Day Smoker    Packs/day: 0.50    Years: 43.00    Pack years: 21.50    Types: Cigarettes  . Smokeless tobacco: Never Used  Vaping Use  . Vaping Use: Never used  Substance Use Topics  . Alcohol use: Not Currently    Alcohol/week: 12.0 standard drinks  Types: 12 Standard drinks or equivalent per week    Comment: occassional  . Drug use: Not Currently    Types: Cocaine    Comment: hx of crack/cocaine 84yrs ago 10/01/2019- none    Home Medications Prior to Admission medications   Medication Sig Start Date End Date Taking? Authorizing Provider  acetaminophen (TYLENOL) 650 MG CR tablet Take 1,300 mg by mouth 3 (three) times daily.    [provider]  allopurinol (ZYLOPRIM) 100 MG tablet TAKE 1 TABLET(100 MG) BY MOUTH DAILY Patient taking differently: Take 100 mg by mouth daily. 01/12/21   [name], Dinah C, NP  ANORO ELLIPTA 62.5-25 MCG/INH AEPB INHALE 1 PUFF BY MOUTH EVERY DAY Patient taking differently: Inhale 1 puff into the lungs daily. 12/21/20   [name], NP  aspirin EC 81 MG tablet Take 1 tablet (81 mg total) by mouth in the morning. Swallow whole. 01/31/21   Ghimire, Henreitta Leber, MD  B-D UF III MINI PEN NEEDLES 31G X 5 MM MISC USE FOUR TIMES DAILY 03/02/20   [name], NP  bictegravir-emtricitabine-tenofovir AF (BIKTARVY) 50-200-25 MG TABS tablet Take 1 tablet by mouth daily. 06/30/20   [name], MD  Calcium Acetate 667 MG TABS Take [phone] mg by mouth See admin instructions. Take 1,334 mg by mouth three times a day with meals and 667 mg with each snack 01/20/21   [provider]  carvedilol (COREG) 3.125 MG tablet Take 1 tablet (3.125 mg total) by mouth 2 (two) times daily. 02/13/20   [name], MD  Cinacalcet HCl (SENSIPAR PO) Take 1 tablet by mouth 3  (three) times a week. At dialysis 08/14/20 08/13/21  [provider]  colchicine 0.6 MG tablet Take 0.6-1.2 mg by mouth daily as needed (as directed for gout flares).    [provider]  diclofenac Sodium (VOLTAREN) 1 % GEL APPLY 2 GRAMS TO THE AFFECTED AREA THREE TIMES DAILY AS NEEDED FOR PAIN Patient taking differently: Apply 2 g topically 3 (three) times daily as needed (for pain). 01/12/21   [name], Dinah C, NP  folic acid (FOLVITE) 1 MG tablet Take 1 tablet (1 mg total) by mouth daily. 01/12/21   [name], Dinah C, NP  gabapentin (NEURONTIN) 300 MG capsule Take 1 capsule (300 mg total) by mouth daily. Dose change due to renal function Patient taking differently: Take 300 mg by mouth daily. 12/09/20   [name], NP  insulin lispro (HUMALOG) 100 UNIT/ML KwikPen INJECT 20 UNITS UNDER THE SKIN THREE TIMES DAILY AS NEEDED FOR HIGH BLOOD SUGAR(ABOVE 150) 02/01/21   [name], NP  iron sucrose in sodium chloride 0.9 % 100 mL Iron Sucrose (Venofer) 07/08/20 07/07/21  [provider]  isosorbide mononitrate (IMDUR) 30 MG 24 hr tablet TAKE 1 TABLET BY MOUTH EVERY DAY Patient taking differently: Take 30 mg by mouth daily. 01/12/21   [name], Dinah C, NP  Lancets (ONETOUCH DELICA PLUS [id number]) MISC Inject 1 Device as directed 3 (three) times daily. Dx: E11.40 09/18/19   [name], NP  latanoprost (XALATAN) 0.005 % ophthalmic solution Place 1 drop into both eyes at bedtime.    [provider]  lidocaine-prilocaine (EMLA) cream Apply 1 application topically every Monday, Wednesday, and Friday with hemodialysis.    [provider]  Methoxy PEG-Epoetin Beta (MIRCERA IJ) Mircera 12/25/20   [provider]  multivitamin (RENA-VIT) TABS tablet Take 1 tablet by mouth daily.    [provider]  nitroGLYCERIN (NITROSTAT) 0.3  MG SL tablet ONE TABLET UNDER TONGUE AS NEEDED FOR CHEST PAIN Patient taking differently: Place 0.3 mg under  the tongue as needed for chest pain. 05/01/19   [name], MD  ONETOUCH ULTRA test strip CHECK BLOOD SUGAR 3 TIMES  DAILY Patient taking differently: 1 each by Other route 3 (three) times daily. 05/27/20   [name], NP  pantoprazole (PROTONIX) 40 MG tablet TAKE 1 TABLET(40 MG) BY MOUTH TWICE DAILY Patient taking differently: Take 40 mg by mouth 2 (two) times daily before a meal. 01/12/21   [name], Dinah C, NP  polyethylene glycol (MIRALAX / GLYCOLAX) 17 g packet Take 17 g by mouth daily as needed for mild constipation. Patient taking differently: Take 17 g by mouth daily as needed for mild constipation (MIX AND DRINK). 11/07/20   Aline August, MD  timolol (TIMOPTIC) 0.5 % ophthalmic solution Place 1 drop into both eyes in the morning. 04/04/19   [provider]    Allergies    Augmentin [amoxicillin-pot clavulanate], Mucinex fast-max, and Amphetamines  Review of Systems   Review of Systems  All other systems reviewed and are negative.   Physical Exam Updated Vital Signs BP (!) 98/42   Pulse 100   Temp (!) 97.5 F (36.4 C) (Oral)   Resp 15   Ht 6\' 2"  (1.88 m)   Wt 78.5 kg   SpO2 100%   BMI 22.21 kg/m   Physical Exam Vitals and nursing note reviewed.  Constitutional:      General: He is not in acute distress.    Appearance: He is well-developed. He is ill-appearing.  HENT:     Head: Normocephalic and atraumatic.  Eyes:     Pupils: Pupils are equal, round, and reactive to light.     Comments: Pale conjunctive a  Cardiovascular:     Rate and Rhythm: Normal rate and regular rhythm.     Heart sounds: Murmur heard.   Systolic murmur is present with a grade of 3/6.   Pulmonary:     Effort: Pulmonary effort is normal. No respiratory distress.     Breath sounds: Normal breath sounds. No wheezing or rales.     Comments: Well-healed midline sternotomy scar Abdominal:     General: There is no distension.     Palpations: Abdomen is soft.     Tenderness:  There is no abdominal tenderness. There is no guarding or rebound.  Genitourinary:    Comments: Black stool noted on rectal exam. Musculoskeletal:        General: No tenderness. Normal range of motion.     Cervical back: Normal range of motion and neck supple.     Right lower leg: No edema.     Left lower leg: No edema.     Comments: Fistula present in the left upper extremity  Skin:    General: Skin is warm and dry.     Coloration: Skin is pale.     Findings: No erythema or rash.  Neurological:     Mental Status: He is alert and oriented to person, place, and time. Mental status is at baseline.  Psychiatric:        Mood and Affect: Mood normal.        Behavior: Behavior normal.     ED Results / Procedures / Treatments   Labs (all labs ordered are listed, but only abnormal results are displayed) Labs Reviewed  CBC WITH DIFFERENTIAL/PLATELET - Abnormal; Notable for the following components:  Result Value   RBC 2.04 (*)    Hemoglobin 5.9 (*)    HCT 20.0 (*)    MCHC 29.5 (*)    RDW 18.1 (*)    All other components within normal limits  COMPREHENSIVE METABOLIC PANEL - Abnormal; Notable for the following components:   Glucose, Bld 124 (*)    BUN 77 (*)    Creatinine, Ser 6.76 (*)    Calcium 8.8 (*)    Total Protein 6.3 (*)    Albumin 2.9 (*)    GFR, Estimated 8 (*)    All other components within normal limits  POC OCCULT BLOOD, ED - Abnormal; Notable for the following components:   Fecal Occult Bld POSITIVE (*)    All other components within normal limits  TYPE AND SCREEN  PREPARE RBC (CROSSMATCH)    EKG None  Radiology No results found.  Procedures Procedures   Medications Ordered in ED Medications - No data to display  ED Course  I have reviewed the triage vital signs and the nursing notes.  Pertinent labs & imaging results that were available during my care of the patient were reviewed by me and considered in my medical decision making (see chart for  details).    MDM Rules/Calculators/A&P                          Patient is a 70 year old male with multiple medical problems returning today with 2 large black bowel movements this morning.  This is in the setting of known recurrent GI bleeding.  Patient has been seen for 5 times in the last few months for similar symptoms.  He did receive an iron transfusion approximately 1 week ago and has resumed his aspirin as recommended.  He also completed his course of doxycycline and Flagyl a few days ago.  He was having abdominal pain while he was at dialysis prior to having a large bowel movements but is not having any pain currently.  Blood pressure is 99/63.  He is awake alert and talking.  Concern for recurrent bleeding given patient's history.  He denies any chest pain or shortness of breath at this time but does note that it is present during exertion.  Last full course of dialysis was Wednesday and he noted that his pressures were dropping low then but did receive 45 minutes today.  He does have black stool noted on exam.  Hemoccult pending.  Labs are pending and then will discuss with [facility].  10:35 AM Hemoglobin today has decreased to 5.9.  Platelet count is within normal limits.  White count is within normal limits.  CMP with normal potassium today and anion gap.  GI call is pending.  Patient ordered 2 units of blood.  Blood pressure remains in the upper 90s.  CRITICAL CARE Performed by: Alverto Shedd Total critical care time: 30 minutes Critical care time was exclusive of separately billable procedures and treating other patients. Critical care was necessary to treat or prevent imminent or life-threatening deterioration. Critical care was time spent personally by me on the following activities: development of treatment plan with patient and/or surrogate as well as nursing, discussions with consultants, evaluation of patient's response to treatment, examination of patient, obtaining history  from patient or surrogate, ordering and performing treatments and interventions, ordering and review of laboratory studies, ordering and review of radiographic studies, pulse oximetry and re-evaluation of patient's condition.  MDM Number of Diagnoses or Management Options  Amount and/or Complexity of Data Reviewed Clinical lab tests: ordered and reviewed Decide to obtain previous medical records or to obtain history from someone other than the patient: yes Obtain history from someone other than the patient: yes Review and summarize past medical records: yes Discuss the patient with other providers: yes Independent visualization of images, tracings, or specimens: yes  Risk of Complications, Morbidity, and/or Mortality Presenting problems: high Diagnostic procedures: high Management options: high  Patient Progress Patient progress: stable   Final Clinical Impression(s) / ED Diagnoses Final diagnoses:  Acute GI bleeding  Symptomatic anemia    Rx / DC Orders ED Discharge Orders    None       Blanchie Dessert, MD 02/12/21 1037

## 2021-02-12 NOTE — ED Notes (Signed)
Informed Dr. Maryan Rued of pt's POSITIVE occult blood result.

## 2021-02-13 ENCOUNTER — Encounter (HOSPITAL_COMMUNITY)
Admission: EM | Disposition: A | Discharge status: Home / Self Care | Payer: Self-pay | Source: Home / Self Care | Attending: Internal Medicine

## 2021-02-13 ENCOUNTER — Inpatient Hospital Stay (HOSPITAL_COMMUNITY): Payer: Medicare Other | Admitting: Certified Registered Nurse Anesthetist

## 2021-02-13 ENCOUNTER — Encounter (HOSPITAL_COMMUNITY): Payer: Self-pay | Admitting: Internal Medicine

## 2021-02-13 DIAGNOSIS — K31811 Angiodysplasia of stomach and duodenum with bleeding: Principal | ICD-10-CM

## 2021-02-13 DIAGNOSIS — K921 Melena: Secondary | ICD-10-CM

## 2021-02-13 HISTORY — PX: ENTEROSCOPY: SHX5533

## 2021-02-13 HISTORY — PX: HOT HEMOSTASIS: SHX5433

## 2021-02-13 HISTORY — PX: SCLEROTHERAPY: SHX6841

## 2021-02-13 LAB — CBC
HCT: 19.6 % — ABNORMAL LOW (ref 39.0–52.0)
HCT: 26.1 % — ABNORMAL LOW (ref 39.0–52.0)
Hemoglobin: 6.5 g/dL — CL (ref 13.0–17.0)
Hemoglobin: 9 g/dL — ABNORMAL LOW (ref 13.0–17.0)
MCH: 31.1 pg (ref 26.0–34.0)
MCH: 30.6 pg (ref 26.0–34.0)
MCHC: 33.2 g/dL (ref 30.0–36.0)
MCHC: 34.5 g/dL (ref 30.0–36.0)
MCV: 93.8 fL (ref 80.0–100.0)
MCV: 88.8 fL (ref 80.0–100.0)
Platelets: 94 10*3/uL — ABNORMAL LOW (ref 150–400)
Platelets: 81 10*3/uL — ABNORMAL LOW (ref 150–400)
RBC: 2.09 MIL/uL — ABNORMAL LOW (ref 4.22–5.81)
RBC: 2.94 MIL/uL — ABNORMAL LOW (ref 4.22–5.81)
RDW: 15.9 % — ABNORMAL HIGH (ref 11.5–15.5)
RDW: 15.5 % (ref 11.5–15.5)
WBC: 10.9 10*3/uL — ABNORMAL HIGH (ref 4.0–10.5)
WBC: 10.6 10*3/uL — ABNORMAL HIGH (ref 4.0–10.5)
nRBC: 0.2 % (ref 0.0–0.2)
nRBC: 0.4 % — ABNORMAL HIGH (ref 0.0–0.2)

## 2021-02-13 LAB — POCT I-STAT, CHEM 8
BUN: 108 mg/dL — ABNORMAL HIGH (ref 8–23)
Calcium, Ion: 1.14 mmol/L — ABNORMAL LOW (ref 1.15–1.40)
Chloride: 99 mmol/L (ref 98–111)
Creatinine, Ser: 5.5 mg/dL — ABNORMAL HIGH (ref 0.61–1.24)
Glucose, Bld: 164 mg/dL — ABNORMAL HIGH (ref 70–99)
HCT: 18 % — ABNORMAL LOW (ref 39.0–52.0)
Hemoglobin: 6.1 g/dL — CL (ref 13.0–17.0)
Potassium: 6.1 mmol/L — ABNORMAL HIGH (ref 3.5–5.1)
Sodium: 135 mmol/L (ref 135–145)
TCO2: 29 mmol/L (ref 22–32)

## 2021-02-13 LAB — BASIC METABOLIC PANEL
Anion gap: 15 (ref 5–15)
Anion gap: 13 (ref 5–15)
BUN: 138 mg/dL — ABNORMAL HIGH (ref 8–23)
BUN: 91 mg/dL — ABNORMAL HIGH (ref 8–23)
CO2: 23 mmol/L (ref 22–32)
CO2: 24 mmol/L (ref 22–32)
Calcium: 9 mg/dL (ref 8.9–10.3)
Calcium: 8 mg/dL — ABNORMAL LOW (ref 8.9–10.3)
Chloride: 98 mmol/L (ref 98–111)
Chloride: 100 mmol/L (ref 98–111)
Creatinine, Ser: 8.06 mg/dL — ABNORMAL HIGH (ref 0.61–1.24)
Creatinine, Ser: 5.52 mg/dL — ABNORMAL HIGH (ref 0.61–1.24)
GFR, Estimated: 7 mL/min — ABNORMAL LOW (ref >60)
GFR, Estimated: 10 mL/min — ABNORMAL LOW (ref >60)
Glucose, Bld: 160 mg/dL — ABNORMAL HIGH (ref 70–99)
Glucose, Bld: 163 mg/dL — ABNORMAL HIGH (ref 70–99)
Potassium: 5.4 mmol/L — ABNORMAL HIGH (ref 3.5–5.1)
Potassium: 5.4 mmol/L — ABNORMAL HIGH (ref 3.5–5.1)
Sodium: 136 mmol/L (ref 135–145)
Sodium: 137 mmol/L (ref 135–145)

## 2021-02-13 LAB — HEMOGLOBIN AND HEMATOCRIT, BLOOD
HCT: 22.6 % — ABNORMAL LOW (ref 39.0–52.0)
Hemoglobin: 7.2 g/dL — ABNORMAL LOW (ref 13.0–17.0)

## 2021-02-13 LAB — MRSA PCR SCREENING: MRSA by PCR: NEGATIVE

## 2021-02-13 LAB — GLUCOSE, CAPILLARY
Glucose-Capillary: 152 mg/dL — ABNORMAL HIGH (ref 70–99)
Glucose-Capillary: 129 mg/dL — ABNORMAL HIGH (ref 70–99)
Glucose-Capillary: 161 mg/dL — ABNORMAL HIGH (ref 70–99)
Glucose-Capillary: 132 mg/dL — ABNORMAL HIGH (ref 70–99)

## 2021-02-13 LAB — PROTIME-INR
INR: 1.2 (ref 0.8–1.2)
Prothrombin Time: 15.3 seconds — ABNORMAL HIGH (ref 11.4–15.2)

## 2021-02-13 LAB — PREPARE RBC (CROSSMATCH)

## 2021-02-13 LAB — RAPID URINE DRUG SCREEN, HOSP PERFORMED
Amphetamines: NOT DETECTED
Barbiturates: NOT DETECTED
Benzodiazepines: NOT DETECTED
Cocaine: NOT DETECTED
Opiates: NOT DETECTED
Tetrahydrocannabinol: NOT DETECTED

## 2021-02-13 SURGERY — ENTEROSCOPY
Anesthesia: Monitor Anesthesia Care

## 2021-02-13 MED ORDER — INSULIN ASPART 100 UNIT/ML ~~LOC~~ SOLN
0.0000 [IU] | SUBCUTANEOUS | Status: DC
  Administered 2021-02-13 – 2021-02-15 (×4): 1 [IU] via SUBCUTANEOUS

## 2021-02-13 MED ORDER — LIDOCAINE 2% (20 MG/ML) 5 ML SYRINGE
INTRAMUSCULAR | Status: DC | PRN
  Administered 2021-02-13: 60 mg via INTRAVENOUS

## 2021-02-13 MED ORDER — PROPOFOL 500 MG/50ML IV EMUL
INTRAVENOUS | Status: DC | PRN
  Administered 2021-02-13: 120 ug/kg/min via INTRAVENOUS

## 2021-02-13 MED ORDER — ONDANSETRON HCL 4 MG/2ML IJ SOLN
INTRAMUSCULAR | Status: DC | PRN
  Administered 2021-02-13: 4 mg via INTRAVENOUS

## 2021-02-13 MED ORDER — SUCCINYLCHOLINE CHLORIDE 20 MG/ML IJ SOLN
INTRAMUSCULAR | Status: DC | PRN
  Administered 2021-02-13: 120 mg via INTRAVENOUS

## 2021-02-13 MED ORDER — OXYCODONE HCL 5 MG/5ML PO SOLN: 5.0000 mg | Freq: Once | ORAL | Status: DC | PRN

## 2021-02-13 MED ORDER — ALBUMIN HUMAN 5 % IV SOLN: INTRAVENOUS | Status: DC | PRN

## 2021-02-13 MED ORDER — HYDRALAZINE HCL 20 MG/ML IJ SOLN: 10.0000 mg | Freq: Four times a day (QID) | INTRAMUSCULAR | Status: DC | PRN

## 2021-02-13 MED ORDER — ONDANSETRON HCL 4 MG/2ML IJ SOLN: 4.0000 mg | Freq: Once | INTRAMUSCULAR | Status: DC | PRN

## 2021-02-13 MED ORDER — PHENYLEPHRINE HCL-NACL 10-0.9 MG/250ML-% IV SOLN
INTRAVENOUS | Status: DC | PRN
  Administered 2021-02-13: 25 ug/min via INTRAVENOUS

## 2021-02-13 MED ORDER — SODIUM CHLORIDE 0.9% IV SOLUTION: Freq: Once | INTRAVENOUS | Status: DC

## 2021-02-13 MED ORDER — SODIUM CHLORIDE (PF) 0.9 % IJ SOLN
PREFILLED_SYRINGE | INTRAMUSCULAR | Status: DC | PRN
  Administered 2021-02-13: 3 mL

## 2021-02-13 MED ORDER — CHLORHEXIDINE GLUCONATE CLOTH 2 % EX PADS
6.0000 | MEDICATED_PAD | Freq: Every day | CUTANEOUS | Status: DC
  Administered 2021-02-13 – 2021-02-16 (×4): 6 via TOPICAL

## 2021-02-13 MED ORDER — CLONIDINE HCL 0.1 MG PO TABS: 0.1000 mg | ORAL_TABLET | Freq: Four times a day (QID) | ORAL | Status: DC | PRN

## 2021-02-13 MED ORDER — OXYCODONE HCL 5 MG PO TABS: 5.0000 mg | ORAL_TABLET | Freq: Once | ORAL | Status: DC | PRN

## 2021-02-13 MED ORDER — FENTANYL CITRATE (PF) 100 MCG/2ML IJ SOLN: 25.0000 ug | INTRAMUSCULAR | Status: DC | PRN

## 2021-02-13 MED ORDER — SODIUM CHLORIDE 0.9 % IV SOLN: INTRAVENOUS | Status: DC

## 2021-02-13 MED ORDER — PROPOFOL 10 MG/ML IV BOLUS
INTRAVENOUS | Status: DC | PRN
  Administered 2021-02-13: 90 mg via INTRAVENOUS

## 2021-02-13 NOTE — Anesthesia Preprocedure Evaluation (Addendum)
Anesthesia Evaluation  Patient identified by MRN, date of birth, ID band Patient awake    Reviewed: Allergy & Precautions, NPO status , Patient's Chart, lab work & pertinent test results  Airway Mallampati: II  TM Distance: >3 FB     Dental  (+) Poor Dentition,    Pulmonary Current Smoker and Patient abstained from smoking.,     + decreased breath sounds      Cardiovascular hypertension,  Rhythm:Regular Rate:Tachycardia + Systolic murmurs    Neuro/Psych    GI/Hepatic   Endo/Other  diabetes  Renal/GU      Musculoskeletal   Abdominal   Peds  Hematology   Anesthesia Other Findings   Reproductive/Obstetrics                             Anesthesia Physical Anesthesia Plan  ASA: IV and emergent  Anesthesia Plan: MAC   Post-op Pain Management:    Induction: Intravenous  PONV Risk Score and Plan: Ondansetron and Propofol infusion  Airway Management Planned: Nasal Cannula and Natural Airway  Additional Equipment:   Intra-op Plan:   Post-operative Plan:   Informed Consent: I have reviewed the patients History and Physical, chart, labs and discussed the procedure including the risks, benefits and alternatives for the proposed anesthesia with the patient or authorized representative who has indicated his/her understanding and acceptance.       Plan Discussed with: CRNA and Anesthesiologist  Anesthesia Plan Comments:        Anesthesia Quick Evaluation

## 2021-02-13 NOTE — Consult Note (Signed)
Renal Service Consult Note [facility]  Jacob [name] 02/13/2021 Jacob Blazing, MD Requesting Physician: Dr. Candiss [name], Jacob [name].   Reason for Consult: ESRD pt w/ anemia, GI bleed HPI: The patient is a 70 y.o. year-old w/ hx of DM2, CAD/ MI/ ICM, HTN, gout, chronic syst CHF, hep B, GI bleed, ESRD on HD, COPD, HIV presented w/ tarry bloody stools < 24 hrs. In ED had 3-4 dark stools. Hx recurrent GIB"s, prior AVM's rx'd w/ clipping and APC. In March Brilinta was switched to ASA due to GIB. Early this month had another bleed d/t gastric source , got prbc's and IV iron. Lab showed Hb 5.9.  Had 45 min of HD yesterday. Admitted yesterday and is getting prbc's this am.  Asked to see for dialysis.    Pt seen in room, no abd pain, no SOB or cough, no CP or fevers.      ROS  denies CP  no joint pain   no HA  no blurry vision  no rash      Past Medical History  Past Medical History:  Diagnosis Date  . Acute respiratory failure ([place]) 03/01/2018  . Arthritis    "all over; mostly knees and back" (02/28/2018)  . Chronic lower back pain    stenosis  . Community acquired pneumonia 09/06/2013  . COPD (chronic obstructive pulmonary disease) ([place])   . Coronary atherosclerosis of native coronary artery    a. 02/2003 s/p CABG x 2 (VG->RI, VG->RPDA; b. 11/2019 PCI: LM nl, LAD 90d, D3 50, RI 100, LCX 100p, OM3 100 - fills via L->L collats from D2/dLAD, RCA 100p, VG->RPDA ok, VG->RI 95 (3.5x48 Synergy XD DES).  . Drug abuse ([place])    hx; tested for cocaine as recently as 2/08. says he is not using drugs now - avoided defib. for this reason   . ESRD (end stage renal disease) ([place])    Hemo M-W-F- Jacob [name]  . Fall at home 10/2020  . GERD (gastroesophageal reflux disease)    takes OTC meds as needed  . GI bleeding    a. 11/2019 EGD: angiodysplastic lesions w/ bleeding s/p argon plasma/clipping/epi inj.  . Glaucoma    uses eye drops daily  . Hepatitis B 1968   "tx'd w/isolation; caught it from  toilet stools in gym"  . HFrEF (heart failure with reduced ejection fraction) ([place])    a. 01/2019 Echo: EF 40-45%, diffuse HK, mild basal septal hypertrophy.  . History of blood transfusion 03/01/2019  . History of colon polyps    benign  . History of gout    takes Allopurinol daily as well as Colchicine-if needed (02/28/2018)  . History of kidney stones   . HTN (hypertension)    takes Coreg,Imdur.and Apresoline daily  . Human immunodeficiency virus (HIV) disease ([place]) dx'd 36   on [place] as of 12/2020.    [name] Hyperlipidemia    takes Atorvastatin daily  . Ischemic cardiomyopathy    a. 01/2019 Echo: EF 40-45%, diffuse HK, mild basal septal hypertrophy. Diast dysfxn. Nl RV size/fxn. Sev dil LA. Triv MR/TR/PR.  [name] Muscle spasm    takes Zanaflex as needed  . Myocardial infarction ([place]) ~ 2004/2005  . Nocturia   . Peripheral neuropathy    takes gabapentin daily  . Pneumonia    "at least twice" (02/28/2018)  . Syphilis, unspecified   . Type II diabetes mellitus ([place]) 2004   Lantus daily.Average fasting blood sugar 125-199  . Wears glasses   .  Wears partial dentures    Past Surgical History  Past Surgical History:  Procedure Laterality Date  . AV FISTULA PLACEMENT Left 08/02/2018   Procedure: ARTERIOVENOUS (AV) FISTULA CREATION  left arm radiocephlic;  Surgeon: Jacob Heck, MD;  Location: [place];  Service: Vascular;  Laterality: Left;  . AV FISTULA PLACEMENT Left 08/01/2019   Procedure: LEFT BRACHIOCEPHALIC ARTERIOVENOUS (AV) FISTULA CREATION;  Surgeon: Jacob Posner, MD;  Location: [place];  Service: Vascular;  Laterality: Left;  . BASCILIC VEIN TRANSPOSITION Left 10/03/2019   Procedure: BASILIC VEIN TRANSPOSITION LEFT SECOND STAGE;  Surgeon: Jacob Posner, MD;  Location: [place];  Service: Vascular;  Laterality: Left;  . BIOPSY  01/25/2021   Procedure: BIOPSY;  Surgeon: Jacob Stabler, MD;  Location: [facility] ENDOSCOPY;  Service: Gastroenterology;;  . CARDIAC CATHETERIZATION  10/2002;  12/19/2004   Jacob [name] 03/08/2011  . COLONOSCOPY  2013   Dr.John Henrene [name]   . CORONARY ARTERY BYPASS GRAFT  02/24/2003   CABG X2/notes 03/08/2011  . CORONARY STENT INTERVENTION N/A 12/19/2019   Procedure: CORONARY STENT INTERVENTION;  Surgeon: Jacob Booze, MD;  Location: [place] CV LAB;  Service: Cardiovascular;  Laterality: N/A;  . ENTEROSCOPY N/A 01/25/2021   Procedure: ENTEROSCOPY;  Surgeon: Jacob Stabler, MD;  Location: [place];  Service: Gastroenterology;  Laterality: N/A;  . ESOPHAGOGASTRODUODENOSCOPY (EGD) WITH PROPOFOL N/A 02/08/2019   Procedure: ESOPHAGOGASTRODUODENOSCOPY (EGD) WITH PROPOFOL;  Surgeon: Jacob Banister, MD;  Location: [place];  Service: Gastroenterology;  Laterality: N/A;  . ESOPHAGOGASTRODUODENOSCOPY (EGD) WITH PROPOFOL N/A 12/22/2019   Procedure: ESOPHAGOGASTRODUODENOSCOPY (EGD) WITH PROPOFOL;  Surgeon: Jacob Bullion, DO;  Location: [place];  Service: Gastroenterology;  Laterality: N/A;  . ESOPHAGOGASTRODUODENOSCOPY (EGD) WITH PROPOFOL N/A 10/19/2020   Procedure: ESOPHAGOGASTRODUODENOSCOPY (EGD) WITH PROPOFOL;  Surgeon: Jacob Denmark, MD;  Location[facility] ENDOSCOPY;  Service: Endoscopy;  Laterality: N/A;  . ESOPHAGOGASTRODUODENOSCOPY (EGD) WITH PROPOFOL N/A 12/22/2020   Procedure: ESOPHAGOGASTRODUODENOSCOPY (EGD) WITH PROPOFOL;  Surgeon: Jacob Mayer, MD;  Location: [place];  Service: Endoscopy;  Laterality: N/A;  . ESOPHAGOGASTRODUODENOSCOPY (EGD) WITH PROPOFOL N/A 01/09/2021   Procedure: ESOPHAGOGASTRODUODENOSCOPY (EGD) WITH PROPOFOL;  Surgeon: Jacob Shipper, MD;  Location: [facility] ENDOSCOPY;  Service: Endoscopy;  Laterality: N/A;  . HEMOSTASIS CLIP PLACEMENT  12/22/2019   Procedure: HEMOSTASIS CLIP PLACEMENT;  Surgeon: Jacob Bullion, DO;  Location: [place] ENDOSCOPY;  Service: Gastroenterology;;  . HEMOSTASIS CLIP PLACEMENT  12/22/2020   Procedure: HEMOSTASIS CLIP PLACEMENT;  Surgeon: Jacob Mayer, MD;  Location: [facility] ENDOSCOPY;  Service:  Endoscopy;;  . HEMOSTASIS CONTROL  12/22/2020   Procedure: HEMOSTASIS CONTROL/hemospray;  Surgeon: Jacob Mayer, MD;  Location: [place];  Service: Endoscopy;;  . HOT HEMOSTASIS N/A 02/08/2019   Procedure: HOT HEMOSTASIS (ARGON PLASMA COAGULATION/BICAP);  Surgeon: Jacob Banister, MD;  Location: [facility] ENDOSCOPY;  Service: Gastroenterology;  Laterality: N/A;  . HOT HEMOSTASIS N/A 12/22/2019   Procedure: HOT HEMOSTASIS (ARGON PLASMA COAGULATION/BICAP);  Surgeon: Jacob Bullion, DO;  Location: [facility] ENDOSCOPY;  Service: Gastroenterology;  Laterality: N/A;  . HOT HEMOSTASIS N/A 10/19/2020   Procedure: HOT HEMOSTASIS (ARGON PLASMA COAGULATION/BICAP);  Surgeon: Jacob Denmark, MD;  Location: [facility] ENDOSCOPY;  Service: Endoscopy;  Laterality: N/A;  . HOT HEMOSTASIS N/A 12/22/2020   Procedure: HOT HEMOSTASIS (ARGON PLASMA COAGULATION/BICAP);  Surgeon: Jacob Mayer, MD;  Location: [facility] ENDOSCOPY;  Service: Endoscopy;  Laterality: N/A;  . HOT HEMOSTASIS N/A 01/09/2021   Procedure: HOT HEMOSTASIS (ARGON PLASMA COAGULATION/BICAP);  Surgeon: Jacob Shipper, MD;  Location: [facility] ENDOSCOPY;  Service: Endoscopy;  Laterality: N/A;  . HOT HEMOSTASIS N/A 01/25/2021   Procedure: HOT HEMOSTASIS (ARGON PLASMA COAGULATION/BICAP);  Surgeon: Jacob Stabler, MD;  Location: [place];  Service: Gastroenterology;  Laterality: N/A;  . INTERTROCHANTERIC HIP FRACTURE SURGERY Left 11/2006   Jacob [name] 03/08/2011  . INTRAVASCULAR ULTRASOUND/IVUS N/A 12/19/2019   Procedure: Intravascular Ultrasound/IVUS;  Surgeon: Jacob Booze, MD;  Location: [place] CV LAB;  Service: Cardiovascular;  Laterality: N/A;  . IR FLUORO GUIDE CV LINE RIGHT  07/24/2019  . IR FLUORO GUIDE CV LINE RIGHT  07/30/2019  . IR US GUIDE VASC ACCESS RIGHT  07/24/2019  . IR US GUIDE VASC ACCESS RIGHT  07/30/2019  . LAPAROSCOPIC CHOLECYSTECTOMY  05/2006  . LIGATION OF COMPETING BRANCHES OF ARTERIOVENOUS FISTULA Left 11/05/2018   Procedure: LIGATION OF COMPETING  BRANCHES OF ARTERIOVENOUS FISTULA  LEFT  ARM;  Surgeon: Jacob Heck, MD;  Location: [place];  Service: Vascular;  Laterality: Left;  . LUMBAR LAMINECTOMY/DECOMPRESSION MICRODISCECTOMY N/A 02/29/2016   Procedure: Left L4-5 Lateral Recess Decompression, Removal Extradural Intraspinal Facet Cyst;  Surgeon: Marybelle Killings, MD;  Location: [place];  Service: Orthopedics;  Laterality: N/A;  . MULTIPLE TOOTH EXTRACTIONS    . ORIF MANDIBULAR FRACTURE Left 08/13/2004   ORIF of left body fracture mandible with KLS Martin 2.3-mm six hole/notes 03/08/2011  . RIGHT/LEFT HEART CATH AND CORONARY/GRAFT ANGIOGRAPHY N/A 12/19/2019   Procedure: RIGHT/LEFT HEART CATH AND CORONARY/GRAFT ANGIOGRAPHY;  Surgeon: Jacob Booze, MD;  Location: [place] CV LAB;  Service: Cardiovascular;  Laterality: N/A;  . SCLEROTHERAPY  12/22/2019   Procedure: SCLEROTHERAPY;  Surgeon: Jacob Bullion, DO;  Location: [facility] ENDOSCOPY;  Service: Gastroenterology;;   Family History  Family History  Problem Relation Age of Onset  . Heart failure Father   . Hypertension Father   . Diabetes Brother   . Heart attack Brother   . Alzheimer's disease Mother   . Stroke Sister   . Diabetes Sister   . Alzheimer's disease Sister   . Hypertension Brother   . Diabetes Brother   . Drug abuse Brother   . Colon cancer Neg Hx    Social History  reports that he has been smoking cigarettes. He has a 21.50 pack-year smoking history. He has never used smokeless tobacco. He reports previous alcohol use of about 12.0 standard drinks of alcohol per week. He reports previous drug use. Drug: Cocaine. Allergies  Allergies  Allergen Reactions  . Augmentin [Amoxicillin-Pot Clavulanate] Diarrhea and Other (See Comments)    Severe diarrhea  . Mucinex Fast-Max Other (See Comments)    Intense sweating   . Amphetamines Other (See Comments)    Unknown reaction   Home medications Prior to Admission medications   Medication Sig Start Date End Date  Taking? Authorizing Provider  acetaminophen (TYLENOL) 650 MG CR tablet Take 1,300 mg by mouth 3 (three) times daily.   Yes [provider]  allopurinol (ZYLOPRIM) 100 MG tablet TAKE 1 TABLET(100 MG) BY MOUTH DAILY Patient taking differently: Take 100 mg by mouth daily. 01/12/21  Yes [name], Dinah C, NP  ANORO ELLIPTA 62.5-25 MCG/INH AEPB INHALE 1 PUFF BY MOUTH EVERY DAY Patient taking differently: Inhale 1 puff into the lungs daily. 12/21/20  Yes [name], NP  aspirin EC 81 MG tablet Take 1 tablet (81 mg total) by mouth in the morning. Swallow whole. 01/31/21  Yes Ghimire, Henreitta Leber, MD  bictegravir-emtricitabine-tenofovir AF (BIKTARVY) 50-200-25 MG TABS tablet Take  1 tablet by mouth daily. 06/30/20  Yes [name], MD  Calcium Acetate 667 MG TABS Take [phone] mg by mouth See admin instructions. Take 1,334 mg by mouth three times a day with meals and 667 mg with each snack 01/20/21  Yes [provider]  carvedilol (COREG) 3.125 MG tablet Take 1 tablet (3.125 mg total) by mouth 2 (two) times daily. 02/13/20  Yes [name], MD  Cinacalcet HCl (SENSIPAR PO) Take 1 tablet by mouth 3 (three) times a week. At dialysis 08/14/20 08/13/21 Yes [provider]  colchicine 0.6 MG tablet Take 0.6-1.2 mg by mouth daily as needed (as directed for gout flares).   Yes [provider]  diclofenac Sodium (VOLTAREN) 1 % GEL APPLY 2 GRAMS TO THE AFFECTED AREA THREE TIMES DAILY AS NEEDED FOR PAIN Patient taking differently: Apply 2 g topically 3 (three) times daily as needed (for pain). 01/12/21  Yes [name], Dinah C, NP  folic acid (FOLVITE) 1 MG tablet Take 1 tablet (1 mg total) by mouth daily. 01/12/21  Yes [name], Dinah C, NP  gabapentin (NEURONTIN) 300 MG capsule Take 1 capsule (300 mg total) by mouth daily. Dose change due to renal function Patient taking differently: Take 300 mg by mouth daily. 12/09/20  Yes [name], NP  insulin lispro (HUMALOG) 100  UNIT/ML KwikPen INJECT 20 UNITS UNDER THE SKIN THREE TIMES DAILY AS NEEDED FOR HIGH BLOOD SUGAR(ABOVE 150) 02/01/21  Yes [name], NP  iron sucrose in sodium chloride 0.9 % 100 mL Iron Sucrose (Venofer) 07/08/20 07/07/21 Yes [provider]  isosorbide mononitrate (IMDUR) 30 MG 24 hr tablet TAKE 1 TABLET BY MOUTH EVERY DAY Patient taking differently: Take 30 mg by mouth daily. 01/12/21  Yes [name], Dinah C, NP  latanoprost (XALATAN) 0.005 % ophthalmic solution Place 1 drop into both eyes at bedtime.   Yes [provider]  lidocaine-prilocaine (EMLA) cream Apply 1 application topically every Monday, Wednesday, and Friday with hemodialysis.   Yes [provider]  multivitamin (RENA-VIT) TABS tablet Take 1 tablet by mouth daily.   Yes [provider]  nitroGLYCERIN (NITROSTAT) 0.3 MG SL tablet ONE TABLET UNDER TONGUE AS NEEDED FOR CHEST PAIN Patient taking differently: Place 0.3 mg under the tongue as needed for chest pain. 05/01/19  Yes [name], MD  pantoprazole (PROTONIX) 40 MG tablet TAKE 1 TABLET(40 MG) BY MOUTH TWICE DAILY Patient taking differently: Take 40 mg by mouth 2 (two) times daily before a meal. 01/12/21  Yes [name], Dinah C, NP  polyethylene glycol (MIRALAX / GLYCOLAX) 17 g packet Take 17 g by mouth daily as needed for mild constipation. Patient taking differently: Take 17 g by mouth daily as needed for mild constipation (MIX AND DRINK). 11/07/20  Yes Aline August, MD  timolol (TIMOPTIC) 0.5 % ophthalmic solution Place 1 drop into both eyes in the morning. 04/04/19  Yes [provider]  B-D UF III MINI PEN NEEDLES 31G X 5 MM MISC USE FOUR TIMES DAILY 03/02/20   [name], NP  Lancets [facility] DELICA PLUS [id number]) MISC Inject 1 Device as directed 3 (three) times daily. Dx: E11.40 09/18/19   [name], NP  Methoxy PEG-Epoetin Beta (MIRCERA IJ) Mircera 12/25/20   [provider]  ONETOUCH ULTRA test strip  CHECK BLOOD SUGAR 3 TIMES  DAILY Patient taking differently: 1 each by Other route 3 (three) times daily. 05/27/20   [name], NP     Vitals:  02/13/21 0033 02/13/21 0212 02/13/21 0335 02/13/21 0758  BP: (!) 115/59 116/64 110/68 93/79  Pulse: 96 (!) 102 99 (!) 105  Resp: 15 20 16    Temp: (!) 97.5 F (36.4 C) 98 F (36.7 C) 98 F (36.7 C) 98 F (36.7 C)  TempSrc: Oral Oral Oral Oral  SpO2: 99% 99% 98% 100%  Weight:      Height:       Exam Gen alert, no distress No rash, cyanosis or gangrene Sclera anicteric, throat clear  No jvd or bruits Chest clear bilat to bases, no rales/ wheezing RRR no MRG Abd soft ntnd no mass or ascites +bs GU normal MS no joint effusions or deformity Ext no LE or UE edema, no wounds or ulcers Neuro is alert, Ox 3 , nf  LUA AVF+bruit      Home meds:  - allopurinol 100/ colchicine prn  - sensipar tiw/ phoslo 2 ac tid  - protonix 40 bid  - imdur 30 qd/ sl ntg prn/ asa 81  - anoro ellipta 1 puff qd  - biktarvy 1 qd  - coreg 3.125 bid  - neurontin 300 qd  - insulin lispro 20u tid ac prn  - prn's/ vitamins/ supplements    OP HD: [facility] MWF  4h  400/500  75kg  2/2 bath  AVF  15g  Hep none  - mircera 225 ug last 4/13 , due 4/27  - calc 3.75 ut tiw po    Na 136  K 5.4  CO2 23  BUN 138 Cr 8.0  Ca 9.0  Hb 7.2  WBC 5K    FOB +      BP 95- 114/ 55- 70  HR 90- 100 RR 18- 22  Temp afeb  RA 100%    Assessment/ Plan: 1. GI bleed - recurrent issue, prior AVM"s / gastritis. Getting prbc's this am. 2. ESRD - on HD MWF. Had short HD yest < 1 hr. Plan short 2- 3 hr HD this am pre endoscopy to stabilize vol and K+.  3. Hyperkalemia - rx w/ HD 4. Anemia ckd - next esa due 4/27 5. MBD ckd - cont vdra, binders  6. HIV - biktarvy 7. COPD 8. Chron systolic CHF 9. HTN / vol - up 2-3kg, UF same w/ HD today, no SOB or resp issues, exam stable 10. DM2 - per pmd      Kelly Splinter  MD 02/13/2021, 8:32 AM  Recent Labs  Lab 02/12/21 0855  02/12/21 2054 02/13/21 0637  WBC 5.9  --   --   HGB 5.9* 6.9* 7.2*   Recent Labs  Lab 02/12/21 0855 02/13/21 0637  K 4.7 5.4*  BUN 77* 138*  CREATININE 6.76* 8.06*  CALCIUM 8.8* 9.0

## 2021-02-13 NOTE — Anesthesia Postprocedure Evaluation (Signed)
Anesthesia Post Note  Patient: Jacob [name]  Procedure(s) Performed: ENTEROSCOPY (N/A ) HOT HEMOSTASIS (ARGON PLASMA COAGULATION/BICAP) (N/A ) SCLEROTHERAPY     Patient location during evaluation: PACU Anesthesia Type: General Level of consciousness: awake and alert Pain management: pain level controlled Vital Signs Assessment: post-procedure vital signs reviewed and stable Respiratory status: spontaneous breathing, nonlabored ventilation, respiratory function stable and patient connected to nasal cannula oxygen Cardiovascular status: blood pressure returned to baseline and stable Postop Assessment: no apparent nausea or vomiting Anesthetic complications: no   No complications documented.  Last Vitals:  Vitals:   02/13/21 1437 02/13/21 1501  BP: (!) 146/54 139/74  Pulse: 100 99  Resp: 20 19  Temp:  36.4 C  SpO2: 100% 98%    Last Pain:  Vitals:   02/13/21 1501  TempSrc: Axillary  PainSc:                  [name]

## 2021-02-13 NOTE — Progress Notes (Signed)
Anesthesiology Note:  Jacob [name] is a 70 y.o. male with PMHof  systolic heart failure, CAD with CABG 2004 and a drug-eluting stent placed February 2021. he also has end-stage renal disease with hemodialysis MWF. HIV. COPD.  He has a history of recurrent bleeding gastric and duodenal AVMs.  He was admitted yesterday with melena and a hemoglobin of 5.9.  He underwent hemodialysis this morning and had been transfused 3 units of packed red blood cells since admission. The  hemoglobin was 7.2 this morning and he received an additional unit of packed red blood cells on on dialysis.  His potassium was also 5.4 prior to dialysis.  He then underwent upper endoscopy and enteroscopy by Dr. Lyndel Safe.  This required general endotracheal anesthesia.  He was found to have active bleeding sites of gastric and duodenal AVMs.  These were cauterized and injected.  A CBC done during the procedure revealed a hemoglobin of 6.5 with hematocrit of 19.6 and Bmet showed potassium 5.4.  He was then transfused an additional 2 units of packed red blood cells during and after the endoscopy procedure today.  He remained hemodynamically stable throughout and after the procedure.  Roberts Gaudy

## 2021-02-13 NOTE — Anesthesia Procedure Notes (Signed)
Procedure Name: MAC Date/Time: 02/13/2021 12:05 PM Performed by: Dorthea Cove, CRNA Pre-anesthesia Checklist: Patient identified, Emergency Drugs available, Suction available, Patient being monitored and Timeout performed Patient Re-evaluated:Patient Re-evaluated prior to induction Oxygen Delivery Method: Nasal cannula Preoxygenation: Pre-oxygenation with 100% oxygen Induction Type: IV induction Placement Confirmation: positive ETCO2 and CO2 detector Dental Injury: Teeth and Oropharynx as per pre-operative assessment

## 2021-02-13 NOTE — Progress Notes (Addendum)
PROGRESS NOTE                                                                                                                                                                                                             Patient Demographics:    Jacob [name], is a 70 y.o. male, DOB - [birth year], ZDG:[id number]  Outpatient Primary MD for the patient is Jacob Chandler, NP    LOS - 1  Admit date - 02/12/2021    Chief Complaint  Patient presents with  . Abdominal Pain  . Rectal Bleeding       Brief Narrative (HPI from H&P) -  Jacob [name] is a 70 y.o. male with medical history significant of DM; CAD; HIV; HTN; chronic systolic CHF; ESRD on MWF HD; prior cocaine abuse; and chronic back pain presenting with GI bleeding.  He was last hospitalized from 4/3-5 with the same; EGD showed AVMs treated with APC with home PPI and ASA held through 4/10.  He reports that he started ASA again on 4/10 as instructed.  He had no apparent issues until last night, when he woke up overnight feeling bloated followed by black stool, he was diagnosed with UGI bleed and admitted.   Subjective:    Jacob [name] today has, No headache, No chest pain, No abdominal pain - No Nausea, No new weakness tingling or numbness, no SOB   Assessment  & Plan :    1. Recurrent UGI bleeding causing acute blood loss related anemia.  This happened few weeks ago when EGD showed upper GI AVMs in the Duodenum, he is currently getting 2 units of packed RBC on 02/13/2021, on IV PPI , bowel rest, GI consulted will undergo likely EGD again soon.  2.  ESRD.  MWF schedule, GI consulted will undergo dialysis on 02/13/2021 as he missed almost 3 hours of dialysis on 02/12/2021.  3.  Essential hypertension.  Placed on scheduled oral home medications along with as needed blood pressure medications as well.  4.  CAD with chronic combined systolic and diastolic heart failure EF 40%.   No acute issues, holding ASA due to GI bleed, currently compensated.  Coreg will be continued.  5.  History of HIV.  CD4 count 6 months ago was 403, on Biktarvy continue  6.  COPD.  Supportive care.  7.  History of glaucoma.  On home eyedrops.  8.  Polysubstance abuse including EtOH, cocaine and tobacco abuse.  Counseled to quit.  Says has not had alcohol in months, does not give a clear answer on cocaine use. UDS negative.  9.  DM type II.  For now every 4 hours Accu-Cheks with sliding scale.  Lab Results  Component Value Date   HGBA1C 4.6 12/15/2020   CBG (last 3)  Recent Labs    02/12/21 1639 02/12/21 1959 02/13/21 0757  GLUCAP 134* 157* 152*        Condition - Extremely Guarded  Family Communication  :   Jacob [name] [phone] 02/13/21   Code Status :  Full  Consults  :  GI, Renal  PUD Prophylaxis : PPI   Procedures  :     EGD      Disposition Plan  :    Status is: Inpatient  Remains inpatient appropriate because:IV treatments appropriate due to intensity of illness or inability to take PO   Dispo: The patient is from: Home              Anticipated d/c is to: Home              Patient currently is not medically stable to d/c.   Difficult to place patient No  DVT Prophylaxis  :   SCDs    Lab Results  Component Value Date   PLT 190 02/12/2021    Diet :  Diet Order            Diet NPO time specified  Diet effective now                  Inpatient Medications  Scheduled Meds: . [MAR Hold] sodium chloride   Intravenous Once  . [MAR Hold] sodium chloride   Intravenous Once  . [MAR Hold] allopurinol  100 mg Oral Daily  . [MAR Hold] bictegravir-emtricitabine-tenofovir AF  1 tablet Oral Daily  . [MAR Hold] calcium acetate  1,334 mg Oral TID with meals  . [MAR Hold] carvedilol  3.125 mg Oral BID  . [MAR Hold] Chlorhexidine Gluconate Cloth  6 each Topical Q0600  . [MAR Hold] gabapentin  300 mg Oral Daily  . [MAR Hold] insulin aspart  0-6  Units Subcutaneous TID WC  . [MAR Hold] isosorbide mononitrate  30 mg Oral Daily  . [MAR Hold] latanoprost  1 drop Both Eyes QHS  . [MAR Hold] multivitamin  1 tablet Oral Daily  . [MAR Hold] nicotine  14 mg Transdermal Daily  . [MAR Hold] pantoprazole (PROTONIX) IV  40 mg Intravenous Q12H  . [MAR Hold] timolol  1 drop Both Eyes q AM  . [MAR Hold] umeclidinium-vilanterol  1 puff Inhalation Daily   Continuous Infusions: . sodium chloride     PRN Meds:.[MAR Hold] acetaminophen **OR** [DISCONTINUED] acetaminophen, [MAR Hold] calcium carbonate (dosed in mg elemental calcium), [MAR Hold] camphor-menthol **AND** [MAR Hold] hydrOXYzine, [MAR Hold] docusate sodium, [MAR Hold] hydrALAZINE, [DISCONTINUED] ondansetron **OR** [MAR Hold] ondansetron (ZOFRAN) IV, [MAR Hold] sorbitol, [MAR Hold] zolpidem  Antibiotics  :    Anti-infectives (From admission, onward)   Start     Dose/Rate Route Frequency Ordered Stop   02/12/21 1230  [MAR Hold]  bictegravir-emtricitabine-tenofovir AF (BIKTARVY) 50-200-25 MG per tablet 1 tablet        (MAR Hold since Sat 02/13/2021 at 1136.Hold Reason: Transfer to a Procedural area.)   1 tablet Oral Daily 02/12/21 1209  Time Spent in minutes  30   Lala Lund M.D on 02/13/2021 at 11:41 AM  To page go to [email or web address]   [facility] -  Office  [phone]    See all Orders from today for further details    Objective:   Vitals:   02/13/21 1100 02/13/21 1105 02/13/21 1115 02/13/21 1135  BP: 91/77 (!) 114/49 128/61 (!) 116/102  Pulse: (!) 107 (!) 110 (!) 101 (!) 110  Resp: 20 (!) 21 19 20   Temp:  97.7 F (36.5 C)    TempSrc: Oral Oral    SpO2: 100% 100% 100% 98%  Weight:    76.5 kg  Height:    6\' 2"  (1.88 m)    Wt Readings from Last 3 Encounters:  02/13/21 76.5 kg  02/04/21 78.1 kg  01/25/21 74.7 kg     Intake/Output Summary (Last 24 hours) at 02/13/2021 1141 Last data filed at 02/13/2021 1105 Gross per 24 hour  Intake 616.67 ml   Output -250 ml  Net 866.67 ml     Physical Exam  Awake Alert, No new F.N deficits, Normal affect [place].AT,PERRAL Supple Neck,No JVD, No cervical lymphadenopathy appriciated.  Symmetrical Chest wall movement, Good air movement bilaterally, CTAB RRR,No Gallops,Rubs or new Murmurs, No Parasternal Heave +ve B.Sounds, Abd Soft, No tenderness, No organomegaly appriciated, No rebound - guarding or rigidity. No Cyanosis, Clubbing or edema, No new Rash or bruise       Data Review:    CBC Recent Labs  Lab 02/12/21 0855 02/12/21 2054 02/13/21 0637  WBC 5.9  --   --   HGB 5.9* 6.9* 7.2*  HCT 20.0* 21.6* 22.6*  PLT 190  --   --   MCV 98.0  --   --   MCH 28.9  --   --   MCHC 29.5*  --   --   RDW 18.1*  --   --   LYMPHSABS 1.2  --   --   MONOABS 0.5  --   --   EOSABS 0.1  --   --   BASOSABS 0.0  --   --     Recent Labs  Lab 02/12/21 0855 02/13/21 0637  NA 139 136  K 4.7 5.4*  CL 99 98  CO2 28 23  GLUCOSE 124* 160*  BUN 77* 138*  CREATININE 6.76* 8.06*  CALCIUM 8.8* 9.0  AST 16  --   ALT 12  --   ALKPHOS 64  --   BILITOT 0.5  --   ALBUMIN 2.9*  --     ------------------------------------------------------------------------------------------------------------------ No results for input(s): CHOL, HDL, LDLCALC, TRIG, CHOLHDL, LDLDIRECT in the last 72 hours.  Lab Results  Component Value Date   HGBA1C 4.6 12/15/2020   ------------------------------------------------------------------------------------------------------------------ No results for input(s): TSH, T4TOTAL, T3FREE, THYROIDAB in the last 72 hours.  Invalid input(s): FREET3  Cardiac Enzymes No results for input(s): CKMB, TROPONINI, MYOGLOBIN in the last 168 hours.  Invalid input(s): CK ------------------------------------------------------------------------------------------------------------------    Component Value Date/Time   BNP 1,535.2 (H) 07/21/2019 0867    Micro Results Recent Results (from  the past 240 hour(s))  SARS CORONAVIRUS 2 (TAT 6-24 HRS) Nasopharyngeal Nasopharyngeal Swab     Status: None   Collection Time: 02/12/21 12:18 PM   Specimen: Nasopharyngeal Swab  Result Value Ref Range Status   SARS Coronavirus 2 NEGATIVE NEGATIVE Final    Comment: (NOTE) SARS-CoV-2 target nucleic acids are NOT DETECTED.  The SARS-CoV-2 RNA is generally detectable in upper and lower  respiratory specimens during the acute phase of infection. Negative results do not preclude SARS-CoV-2 infection, do not rule out co-infections with other pathogens, and should not be used as the sole basis for treatment or other patient management decisions. Negative results must be combined with clinical observations, patient history, and epidemiological information. The expected result is Negative.  Fact Sheet for Patients: SugarRoll.be  Fact Sheet for Healthcare Providers: https://www.woods-mathews.com/  This test is not yet approved or cleared by the [place] FDA and  has been authorized for detection and/or diagnosis of SARS-CoV-2 by FDA under an Emergency Use Authorization (EUA). This EUA will remain  in effect (meaning this test can be used) for the duration of the COVID-19 declaration under Se ction 564(b)(1) of the Act, 21 U.S.C. section 360bbb-3(b)(1), unless the authorization is terminated or revoked sooner.  Performed at [facility], [place] [address] [place], [place] [address]   MRSA PCR Screening     Status: None   Collection Time: 02/12/21 10:00 PM   Specimen: Nasal Mucosa; Nasopharyngeal  Result Value Ref Range Status   MRSA by PCR NEGATIVE NEGATIVE Final    Comment:        The GeneXpert MRSA Assay (FDA approved for NASAL specimens only), is one component of a comprehensive MRSA colonization surveillance program. It is not intended to diagnose MRSA infection nor to guide or monitor treatment for MRSA infections. Performed  at [facility], [place] [address], [place],  [address]     Radiology Reports DG CHEST PORT 1 VIEW  Result Date: 01/24/2021 CLINICAL DATA:  Shortness of breath. EXAM: PORTABLE CHEST 1 VIEW COMPARISON:  12/11/2020 FINDINGS: Lungs are adequately inflated without focal airspace consolidation or effusion. Subtle hazy prominence of the perihilar markings unchanged likely mild chronic vascular congestion. Moderate stable cardiomegaly. Remainder of the exam is unchanged. IMPRESSION: Moderate stable cardiomegaly with suggestion of minimal chronic vascular congestion. Electronically Signed   By: Marin Olp M.D.   On: 01/24/2021 15:01

## 2021-02-13 NOTE — Progress Notes (Signed)
Patient off unit to dialysis with blood transfusion running. RN ebony in dialysis aware

## 2021-02-13 NOTE — Anesthesia Procedure Notes (Signed)
Procedure Name: Intubation Date/Time: 02/13/2021 12:36 PM Performed by: Dorthea Cove, CRNA Pre-anesthesia Checklist: Patient identified, Emergency Drugs available, Suction available and Patient being monitored Patient Re-evaluated:Patient Re-evaluated prior to induction Oxygen Delivery Method: Circle system utilized Preoxygenation: Pre-oxygenation with 100% oxygen Induction Type: IV induction, Rapid sequence and Cricoid Pressure applied Laryngoscope Size: Mac and 4 Grade View: Grade I Tube type: Oral Tube size: 7.5 mm Number of attempts: 1 Airway Equipment and Method: Stylet and Oral airway Placement Confirmation: ETT inserted through vocal cords under direct vision,  positive ETCO2 and breath sounds checked- equal and bilateral Secured at: 22 cm Tube secured with: Tape Dental Injury: Teeth and Oropharynx as per pre-operative assessment

## 2021-02-13 NOTE — Op Note (Signed)
[facility] Patient Name: Jacob [name] Procedure Date : 02/13/2021 MRN: [id number] Attending MD: Jackquline Denmark , MD Date of Birth: Apr 04, 1951 CSN: [id number] Age: 70 Admit Type: Inpatient Procedure:                Small bowel enteroscopy Indications:              Recurrent Melena. Providers:                Jackquline Denmark, MD, Particia Nearing, RN, Ladona Ridgel,                            Technician Referring MD:              Medicines:                General Anesthesia Complications:            No immediate complications. Estimated Blood Loss:     Estimated blood loss was minimal. Procedure:                Pre-Anesthesia Assessment:                           - Prior to the procedure, a History and Physical                            was performed, and patient medications and                            allergies were reviewed. The patient's tolerance of                            previous anesthesia was also reviewed. The risks                            and benefits of the procedure and the sedation                            options and risks were discussed with the patient.                            All questions were answered, and informed consent                            was obtained. Prior Anticoagulants: The patient has                            taken no previous anticoagulant or antiplatelet                            agents. ASA Grade Assessment: III - A patient with                            severe systemic disease. After reviewing the risks  and benefits, the patient was deemed in                            satisfactory condition to undergo the procedure.                           After obtaining informed consent, the endoscope was                            passed under direct vision. Throughout the                            procedure, the patient's blood pressure, pulse, and                            oxygen saturations were  monitored continuously. The                            [id number]-H190DL ([id number]) Olympus pediatric colonoscope                            was introduced through the mouth and advanced to                            the proximal jejunum. Then we switched to GIF-H190                            ([id number]) Olympus gastroscope was introduced                            through the mouth and advanced to the second                            portion of the duodenum. The small bowel                            enteroscopy was performed with moderate difficulty                            due to active bleeding. The patient tolerated the                            procedure well. Scope In: Scope Out: Findings:      The esophagus was normal with well-defined Z-line.      There was blood throughout the stomach, duodenum and jejunum.      A single 6 mm angiodysplastic lesion with no bleeding was found in the       gastric body. Coagulation for hemostasis using argon plasma at 1       liter/minute and 20 watts was successful. Scar was noted in antrum from       ? previous PEG placement.      A single AVM vs Dieulafoy lesion with active bleeding was found in the       duodenal bulb, best visualized on the retroflexed examination (with  EGD       scope). Area was successfully injected with 3 mL of a 1:10,000 solution       of epinephrine for hemostasis. Coagulation for hemostasis using argon       plasma at 1 liter/minute and 20 watts was successful. The duodenum was       nodular in appearance.      There was no evidence of significant pathology in the entire examined       portion of jejunum. Examination to some extent was limited due to       presence of blood. Impression:               - Actively bleeding AVM vs Dieulafoy lesion in the                            duodenal bulb s/p epi/APC.                           - Gastric AVM s/p APC                           - Nodular duodenum. Recommendation:            - Return patient to hospital ward for ongoing care.                           - Clear liquid diet.                           - Give Protonix (pantoprazole): 8 mg/hr IV by                            continuous infusion x 72 hrs                           - Trend CBC. He was given 2 units of PRBC at the                            time of EGD/enteroscopy.                           - If active recurrent bleeding, IR consultation for                            mesenteric angio/GDA embolization.                           - If there is no further bleeding, would recommend                            relook EGD in 6-8 weeks                           - The findings and recommendations were discussed                            with the patient's family. Procedure Code(s):        ---  Professional ---                           [phone], Small intestinal endoscopy, enteroscopy                            beyond second portion of duodenum, not including                            ileum; with control of bleeding (eg, injection,                            bipolar cautery, unipolar cautery, laser, heater                            probe, stapler, plasma coagulator) Diagnosis Code(s):        --- Professional ---                           K31.811, Angiodysplasia of stomach and duodenum                            with bleeding                           K92.1, Melena (includes Hematochezia) CPT copyright 2019 American Medical Association. All rights reserved. The codes documented in this report are preliminary and upon coder review may  be revised to meet current compliance requirements. Jackquline Denmark, MD 02/13/2021 1:56:10 PM This report has been signed electronically. Number of Addenda: 0

## 2021-02-13 NOTE — Transfer of Care (Signed)
Immediate Anesthesia Transfer of Care Note  Patient: Jacob [name]  Procedure(s) Performed: ENTEROSCOPY (N/A ) HOT HEMOSTASIS (ARGON PLASMA COAGULATION/BICAP) (N/A ) SCLEROTHERAPY  Patient Location: Endoscopy Unit  Anesthesia Type:General  Level of Consciousness: awake, alert  and oriented  Airway & Oxygen Therapy: Patient Spontanous Breathing and Patient connected to face mask oxygen  Post-op Assessment: Report given to RN and Post -op Vital signs reviewed and stable  Post vital signs: Reviewed and stable  Last Vitals:  Vitals Value Taken Time  BP 148/43 02/13/21 1351  Temp    Pulse 94 02/13/21 1352  Resp 23 02/13/21 1352  SpO2 100 % 02/13/21 1352  Vitals shown include unvalidated device data.  Last Pain:  Vitals:   02/13/21 1141  TempSrc: Oral  PainSc:          Complications: No complications documented.

## 2021-02-13 NOTE — Procedures (Signed)
   I was present at this dialysis session, have reviewed the session itself and made  appropriate changes Kelly Splinter MD [place] pager [phone]   02/13/2021, 9:47 AM

## 2021-02-13 NOTE — Progress Notes (Addendum)
82: RN noticed H&H was not drawn. Phlebotomy was in hall, asked if they had labs for patient. Phlebotomist stated she can only see H&H and BMP due at 0600.   H&H order was placed at 0220 for a lab draw for 0412. Apparently phlebotomy or lab or unable to see it. Charge RN looked at orders and found nothing wrong.   0500: Phlebotomy was making her way around and would grab his labs.   0600: RN looked at chart review no labs have been drawn for this patient. Lab called and stated to call phlebotomy. Phlebotomy called 3 times no response.   0625: Phlebotomy called back, headed to unit now to grab his labs first.

## 2021-02-13 NOTE — Interval H&P Note (Signed)
History and Physical Interval Note:  02/13/2021 11:52 AM  Jacob [name]  has presented today for surgery, with the diagnosis of GI Bleed.  The various methods of treatment have been discussed with the patient and family. After consideration of risks, benefits and other options for treatment, the patient has consented to  Procedure(s): ENTEROSCOPY (N/A) as a surgical intervention.  The patient's history has been reviewed, patient examined, no change in status, stable for surgery.  I have reviewed the patient's chart and labs.  Questions were answered to the patient's satisfaction.     Jackquline Denmark

## 2021-02-14 DIAGNOSIS — D649 Anemia, unspecified: Secondary | ICD-10-CM

## 2021-02-14 LAB — CBC WITH DIFFERENTIAL/PLATELET
Abs Immature Granulocytes: 0.03 10*3/uL (ref 0.00–0.07)
Basophils Absolute: 0 10*3/uL (ref 0.0–0.1)
Basophils Relative: 1 %
Eosinophils Absolute: 0.2 10*3/uL (ref 0.0–0.5)
Eosinophils Relative: 2 %
HCT: 23.9 % — ABNORMAL LOW (ref 39.0–52.0)
Hemoglobin: 8 g/dL — ABNORMAL LOW (ref 13.0–17.0)
Immature Granulocytes: 0 %
Lymphocytes Relative: 17 %
Lymphs Abs: 1.4 10*3/uL (ref 0.7–4.0)
MCH: 30.2 pg (ref 26.0–34.0)
MCHC: 33.5 g/dL (ref 30.0–36.0)
MCV: 90.2 fL (ref 80.0–100.0)
Monocytes Absolute: 1 10*3/uL (ref 0.1–1.0)
Monocytes Relative: 12 %
Neutro Abs: 5.8 10*3/uL (ref 1.7–7.7)
Neutrophils Relative %: 68 %
Platelets: 79 10*3/uL — ABNORMAL LOW (ref 150–400)
RBC: 2.65 MIL/uL — ABNORMAL LOW (ref 4.22–5.81)
RDW: 16 % — ABNORMAL HIGH (ref 11.5–15.5)
WBC: 8.3 10*3/uL (ref 4.0–10.5)
nRBC: 0.2 % (ref 0.0–0.2)

## 2021-02-14 LAB — BASIC METABOLIC PANEL
Anion gap: 11 (ref 5–15)
BUN: 117 mg/dL — ABNORMAL HIGH (ref 8–23)
CO2: 25 mmol/L (ref 22–32)
Calcium: 8.8 mg/dL — ABNORMAL LOW (ref 8.9–10.3)
Chloride: 101 mmol/L (ref 98–111)
Creatinine, Ser: 7.07 mg/dL — ABNORMAL HIGH (ref 0.61–1.24)
GFR, Estimated: 8 mL/min — ABNORMAL LOW (ref >60)
Glucose, Bld: 139 mg/dL — ABNORMAL HIGH (ref 70–99)
Potassium: 4.9 mmol/L (ref 3.5–5.1)
Sodium: 137 mmol/L (ref 135–145)

## 2021-02-14 LAB — GLUCOSE, CAPILLARY
Glucose-Capillary: 118 mg/dL — ABNORMAL HIGH (ref 70–99)
Glucose-Capillary: 127 mg/dL — ABNORMAL HIGH (ref 70–99)
Glucose-Capillary: 128 mg/dL — ABNORMAL HIGH (ref 70–99)
Glucose-Capillary: 133 mg/dL — ABNORMAL HIGH (ref 70–99)
Glucose-Capillary: 156 mg/dL — ABNORMAL HIGH (ref 70–99)
Glucose-Capillary: 167 mg/dL — ABNORMAL HIGH (ref 70–99)
Glucose-Capillary: 168 mg/dL — ABNORMAL HIGH (ref 70–99)

## 2021-02-14 LAB — CBC
HCT: 23.2 % — ABNORMAL LOW (ref 39.0–52.0)
HCT: 21.2 % — ABNORMAL LOW (ref 39.0–52.0)
Hemoglobin: 7.7 g/dL — ABNORMAL LOW (ref 13.0–17.0)
Hemoglobin: 7 g/dL — ABNORMAL LOW (ref 13.0–17.0)
MCH: 29.7 pg (ref 26.0–34.0)
MCH: 30.6 pg (ref 26.0–34.0)
MCHC: 33.2 g/dL (ref 30.0–36.0)
MCHC: 33 g/dL (ref 30.0–36.0)
MCV: 89.6 fL (ref 80.0–100.0)
MCV: 92.6 fL (ref 80.0–100.0)
Platelets: 86 10*3/uL — ABNORMAL LOW (ref 150–400)
Platelets: 85 10*3/uL — ABNORMAL LOW (ref 150–400)
RBC: 2.59 MIL/uL — ABNORMAL LOW (ref 4.22–5.81)
RBC: 2.29 MIL/uL — ABNORMAL LOW (ref 4.22–5.81)
RDW: 15.7 % — ABNORMAL HIGH (ref 11.5–15.5)
RDW: 16.3 % — ABNORMAL HIGH (ref 11.5–15.5)
WBC: 8 10*3/uL (ref 4.0–10.5)
WBC: 6.5 10*3/uL (ref 4.0–10.5)
nRBC: 0.3 % — ABNORMAL HIGH (ref 0.0–0.2)
nRBC: 0 % (ref 0.0–0.2)

## 2021-02-14 LAB — MAGNESIUM: Magnesium: 1.7 mg/dL (ref 1.7–2.4)

## 2021-02-14 LAB — BRAIN NATRIURETIC PEPTIDE: B Natriuretic Peptide: 343.7 pg/mL — ABNORMAL HIGH (ref 0.0–100.0)

## 2021-02-14 MED ORDER — PANTOPRAZOLE SODIUM 40 MG IV SOLR
40.0000 mg | Freq: Two times a day (BID) | INTRAVENOUS | Status: DC
  Administered 2021-02-14 – 2021-02-16 (×4): 40 mg via INTRAVENOUS
  Filled 2021-02-14 (×3): qty 40

## 2021-02-14 MED ORDER — MISOPROSTOL 100 MCG PO TABS: 100.0000 ug | ORAL_TABLET | Freq: Four times a day (QID) | ORAL | Status: DC

## 2021-02-14 MED ORDER — MISOPROSTOL 100 MCG PO TABS
100.0000 ug | ORAL_TABLET | Freq: Four times a day (QID) | ORAL | Status: DC
  Administered 2021-02-14 – 2021-02-16 (×7): 100 ug via ORAL
  Filled 2021-02-14 (×10): qty 1

## 2021-02-14 MED ORDER — SUCRALFATE 1 GM/10ML PO SUSP
1.0000 g | Freq: Three times a day (TID) | ORAL | Status: DC
  Administered 2021-02-14: 1 g via ORAL
  Filled 2021-02-14: qty 10

## 2021-02-14 MED ORDER — MAGNESIUM SULFATE IN D5W 1-5 GM/100ML-% IV SOLN
1.0000 g | Freq: Once | INTRAVENOUS | Status: AC
  Administered 2021-02-14: 1 g via INTRAVENOUS
  Filled 2021-02-14: qty 100

## 2021-02-14 MED ORDER — PANTOPRAZOLE SODIUM 40 MG IV SOLR: 40.0000 mg | Freq: Two times a day (BID) | INTRAVENOUS | Status: DC

## 2021-02-14 NOTE — Progress Notes (Signed)
PROGRESS NOTE                                                                                                                                                                                                             Patient Demographics:    Jacob [name], is a 70 y.o. male, DOB - 1951-08-04, VOJ:[id number]  Outpatient Primary MD for the patient is [name], NP    LOS - 2  Admit date - 02/12/2021    Chief Complaint  Patient presents with  . Abdominal Pain  . Rectal Bleeding       Brief Narrative (HPI from H&P) -  Jacob [name] is a 70 y.o. male with medical history significant of DM; CAD; HIV; HTN; chronic systolic CHF; ESRD on MWF HD; prior cocaine abuse; and chronic back pain presenting with GI bleeding.  He was last hospitalized from 4/3-5 with the same; EGD showed AVMs treated with APC with home PPI and ASA held through 4/10.  He reports that he started ASA again on 4/10 as instructed.  He had no apparent issues until last night, when he woke up overnight feeling bloated followed by black stool, he was diagnosed with UGI bleed and admitted.   Subjective:   Patient in bed, appears comfortable, denies any headache, no fever, no chest pain or pressure, no shortness of breath , no abdominal pain. No new focal weakness.   Assessment  & Plan :    1. Recurrent UGI bleeding causing acute blood loss related anemia.  This happened few weeks ago when EGD showed upper GI AVMs in the Duodenum, repeat EGD on 02/13/21 showed Actively bleeding AVM vs Dieulafoy lesion in the duodenal bulb s/p epi/APC.  Gastric AVM s/p APC, he has received 2 units of packed RBC on 02/13/2021, on IV PPI , clears, monitor H&H, GI consulted managing GI issues & Meds [facility] GI 02/14/21.  2.  ESRD.  MWF schedule, Renal on board.  3.  Essential hypertension.  Placed on scheduled oral home medications along with as needed blood pressure medications as  well.  4.  CAD with chronic combined systolic and diastolic heart failure EF 40%.  No acute issues, holding ASA due to GI bleed, currently compensated.  Coreg will be continued.  5.  History of HIV.  CD4 count 6 months ago was  403, on Biktarvy continue  6.  COPD.  Supportive care.  7.  History of glaucoma.  On home eyedrops.  8.  Polysubstance abuse including EtOH, cocaine and tobacco abuse.  Counseled to quit.  Says has not had alcohol in months, does not give a clear answer on cocaine use. UDS negative.  9.  DM type II.  For now every 4 hours Accu-Cheks with sliding scale.  Lab Results  Component Value Date   HGBA1C 4.6 12/15/2020   CBG (last 3)  Recent Labs    02/14/21 0015 02/14/21 0337 02/14/21 0728  GLUCAP 128* 118* 133*        Condition - Extremely Guarded  Family Communication  :   Tivis Ringer [phone] 02/13/21   Code Status :  Full  Consults  :  GI, Renal  PUD Prophylaxis : PPI   Procedures  :     EGD - Actively bleeding AVM vs Dieulafoy lesion in the duodenal bulb s/p epi/APC.  Gastric AVM s/p APC. Nodular duodenum.      Disposition Plan  :    Status is: Inpatient  Remains inpatient appropriate because:IV treatments appropriate due to intensity of illness or inability to take PO   Dispo: The patient is from: Home              Anticipated d/c is to: Home              Patient currently is not medically stable to d/c.   Difficult to place patient No  DVT Prophylaxis  :   SCDs    Lab Results  Component Value Date   PLT 79 (L) 02/14/2021    Diet :  Diet Order            Diet clear liquid Room service appropriate? Yes; Fluid consistency: Thin  Diet effective now                  Inpatient Medications  Scheduled Meds: . allopurinol  100 mg Oral Daily  . bictegravir-emtricitabine-tenofovir AF  1 tablet Oral Daily  . calcium acetate  1,334 mg Oral TID with meals  . carvedilol  3.125 mg Oral BID  . Chlorhexidine Gluconate Cloth  6  each Topical Q0600  . gabapentin  300 mg Oral Daily  . insulin aspart  0-6 Units Subcutaneous Q4H  . isosorbide mononitrate  30 mg Oral Daily  . latanoprost  1 drop Both Eyes QHS  . multivitamin  1 tablet Oral Daily  . nicotine  14 mg Transdermal Daily  . pantoprazole (PROTONIX) IV  40 mg Intravenous Q12H  . timolol  1 drop Both Eyes q AM  . umeclidinium-vilanterol  1 puff Inhalation Daily   Continuous Infusions:  PRN Meds:.acetaminophen **OR** [DISCONTINUED] acetaminophen, calcium carbonate (dosed in mg elemental calcium), camphor-menthol **AND** hydrOXYzine, cloNIDine, docusate sodium, hydrALAZINE, [DISCONTINUED] ondansetron **OR** ondansetron (ZOFRAN) IV, sorbitol, zolpidem  Antibiotics  :    Anti-infectives (From admission, onward)   Start     Dose/Rate Route Frequency Ordered Stop   02/12/21 1230  bictegravir-emtricitabine-tenofovir AF (BIKTARVY) 50-200-25 MG per tablet 1 tablet        1 tablet Oral Daily 02/12/21 1209         Time Spent in minutes  30   Lala Lund M.D on 02/14/2021 at 10:33 AM  To page go to [email or web address]   [facility] -  Office  [phone]    See all Orders from today for further details  Objective:   Vitals:   02/13/21 2000 02/14/21 0000 02/14/21 0324 02/14/21 0729  BP: 115/64 (!) 100/51 (!) 124/57 106/80  Pulse: 100 97 91 86  Resp: 20 18 19 16   Temp: 99.8 F (37.7 C) 98.1 F (36.7 C) 98.1 F (36.7 C) 98.2 F (36.8 C)  TempSrc: Oral Oral Oral Oral  SpO2: 100% 96% 100% 100%  Weight:      Height:        Wt Readings from Last 3 Encounters:  02/13/21 76.5 kg  02/04/21 78.1 kg  01/25/21 74.7 kg     Intake/Output Summary (Last 24 hours) at 02/14/2021 1033 Last data filed at 02/13/2021 1821 Gross per 24 hour  Intake 1660.67 ml  Output -350 ml  Net 2010.67 ml     Physical Exam  Awake Alert, No new F.N deficits, Normal affect New .AT,PERRAL Supple Neck,No JVD, No cervical lymphadenopathy appriciated.  Symmetrical  Chest wall movement, Good air movement bilaterally, CTAB RRR,No Gallops, Rubs or new Murmurs, No Parasternal Heave +ve B.Sounds, Abd Soft, No tenderness, No organomegaly appriciated, No rebound - guarding or rigidity. No Cyanosis, Clubbing or edema, No new Rash or bruise      Data Review:    CBC Recent Labs  Lab 02/12/21 0855 02/12/21 2054 02/13/21 1253 02/13/21 1309 02/13/21 2113 02/14/21 0057 02/14/21 0613  WBC 5.9  --   --  10.9* 10.6* 8.0 8.3  HGB 5.9*   < > 6.1* 6.5* 9.0* 7.7* 8.0*  HCT 20.0*   < > 18.0* 19.6* 26.1* 23.2* 23.9*  PLT 190  --   --  94* 81* 86* 79*  MCV 98.0  --   --  93.8 88.8 89.6 90.2  MCH 28.9  --   --  31.1 30.6 29.7 30.2  MCHC 29.5*  --   --  33.2 34.5 33.2 33.5  RDW 18.1*  --   --  15.9* 15.5 15.7* 16.0*  LYMPHSABS 1.2  --   --   --   --   --  1.4  MONOABS 0.5  --   --   --   --   --  1.0  EOSABS 0.1  --   --   --   --   --  0.2  BASOSABS 0.0  --   --   --   --   --  0.0   < > = values in this interval not displayed.    Recent Labs  Lab 02/12/21 0855 02/13/21 0637 02/13/21 1253 02/13/21 1309 02/13/21 2113 02/14/21 0057 02/14/21 0613  NA 139 136 135 137  --   --  137  K 4.7 5.4* 6.1* 5.4*  --   --  4.9  CL 99 98 99 100  --   --  101  CO2 28 23  --  24  --   --  25  GLUCOSE 124* 160* 164* 163*  --   --  139*  BUN 77* 138* 108* 91*  --   --  117*  CREATININE 6.76* 8.06* 5.50* 5.52*  --   --  7.07*  CALCIUM 8.8* 9.0  --  8.0*  --   --  8.8*  AST 16  --   --   --   --   --   --   ALT 12  --   --   --   --   --   --   ALKPHOS 64  --   --   --   --   --   --  BILITOT 0.5  --   --   --   --   --   --   ALBUMIN 2.9*  --   --   --   --   --   --   MG  --   --   --   --   --   --  1.7  INR  --   --   --   --  1.2  --   --   BNP  --   --   --   --   --  343.7*  --     ------------------------------------------------------------------------------------------------------------------ No results for input(s): CHOL, HDL, LDLCALC, TRIG, CHOLHDL,  LDLDIRECT in the last 72 hours.  Lab Results  Component Value Date   HGBA1C 4.6 12/15/2020   ------------------------------------------------------------------------------------------------------------------ No results for input(s): TSH, T4TOTAL, T3FREE, THYROIDAB in the last 72 hours.  Invalid input(s): FREET3  Cardiac Enzymes No results for input(s): CKMB, TROPONINI, MYOGLOBIN in the last 168 hours.  Invalid input(s): CK ------------------------------------------------------------------------------------------------------------------    Component Value Date/Time   BNP 343.7 (H) 02/14/2021 0057    Micro Results Recent Results (from the past 240 hour(s))  SARS CORONAVIRUS 2 (TAT 6-24 HRS) Nasopharyngeal Nasopharyngeal Swab     Status: None   Collection Time: 02/12/21 12:18 PM   Specimen: Nasopharyngeal Swab  Result Value Ref Range Status   SARS Coronavirus 2 NEGATIVE NEGATIVE Final    Comment: (NOTE) SARS-CoV-2 target nucleic acids are NOT DETECTED.  The SARS-CoV-2 RNA is generally detectable in upper and lower respiratory specimens during the acute phase of infection. Negative results do not preclude SARS-CoV-2 infection, do not rule out co-infections with other pathogens, and should not be used as the sole basis for treatment or other patient management decisions. Negative results must be combined with clinical observations, patient history, and epidemiological information. The expected result is Negative.  Fact Sheet for Patients: SugarRoll.be  Fact Sheet for Healthcare Providers: https://www.woods-mathews.com/  This test is not yet approved or cleared by the [place] FDA and  has been authorized for detection and/or diagnosis of SARS-CoV-2 by FDA under an Emergency Use Authorization (EUA). This EUA will remain  in effect (meaning this test can be used) for the duration of the COVID-19 declaration under Se ction  564(b)(1) of the Act, 21 U.S.C. section 360bbb-3(b)(1), unless the authorization is terminated or revoked sooner.  Performed at [facility], [place] [address] [place], [place] [address]   MRSA PCR Screening     Status: None   Collection Time: 02/12/21 10:00 PM   Specimen: Nasal Mucosa; Nasopharyngeal  Result Value Ref Range Status   MRSA by PCR NEGATIVE NEGATIVE Final    Comment:        The GeneXpert MRSA Assay (FDA approved for NASAL specimens only), is one component of a comprehensive MRSA colonization surveillance program. It is not intended to diagnose MRSA infection nor to guide or monitor treatment for MRSA infections. Performed at [facility], [place] [address], [place], [place] [address]     Radiology Reports DG CHEST PORT 1 VIEW  Result Date: 01/24/2021 CLINICAL DATA:  Shortness of breath. EXAM: PORTABLE CHEST 1 VIEW COMPARISON:  12/11/2020 FINDINGS: Lungs are adequately inflated without focal airspace consolidation or effusion. Subtle hazy prominence of the perihilar markings unchanged likely mild chronic vascular congestion. Moderate stable cardiomegaly. Remainder of the exam is unchanged. IMPRESSION: Moderate stable cardiomegaly with suggestion of minimal chronic vascular congestion. Electronically Signed   By:  Marin Olp M.D.   On: 01/24/2021 15:01

## 2021-02-14 NOTE — Progress Notes (Addendum)
Subjective: Reports some mild abdominal discomfort, HD yesterday tolerated after recannulation of AV fistula x3 (no problem with experienced cannula tor)   Objective Vital signs in last 24 hours: Vitals:   02/13/21 2000 02/14/21 0000 02/14/21 0324 02/14/21 0729  BP: 115/64 (!) 100/51 (!) 124/57 106/80  Pulse: 100 97 91 86  Resp: 20 18 19 16   Temp: 99.8 F (37.7 C) 98.1 F (36.7 C) 98.1 F (36.7 C) 98.2 F (36.8 C)  TempSrc: Oral Oral Oral Oral  SpO2: 100% 96% 100% 100%  Weight:      Height:       Weight change: -1.972 kg  Physical Exam: General: Alert, NAD Heart: RRR no MRG Lungs: CTA nonlabored breathing Abdomen: Bowel sounds positive normoactive soft nondistended slightly tender mid abdomen no rebound, no ascites Extremities: No pedal edema Dialysis Access: Positive bruit left upper arm AV fistula no bruising or swelling noted   Home meds:  - allopurinol 100/ colchicine prn  - sensipar tiw/ phoslo 2 ac tid  - protonix 40 bid  - imdur 30 qd/ sl ntg prn/ asa 81  - anoro ellipta 1 puff qd  - biktarvy 1 qd  - coreg 3.125 bid  - neurontin 300 qd  - insulin lispro 20u tid ac prn  - prn's/ vitamins/ supplements    OP HD: [facility] MWF  4h  400/500  75kg  2/2 bath  AVF  15g  Hep none  - mircera 225 ug last 4/13 , due 4/27  - calc 3.75 ut tiw po    Na 136  K 5.4  CO2 23  BUN 138 Cr 8.0  Ca 9.0  Hb 7.2  WBC 5K    FOB +      BP 95- 114/ 55- 70  HR 90- 100 RR 18- 22  Temp afeb  RA 100%    Problem/Plan: 1. GI bleed - recurrent issue, prior AVM"s / gastritis, 2 units transfuse packed red blood cells 4/23, EGD 4/23 noted active bleeding AVM versus Dieulafoy lesion duodenum/gastric AVM status post APC treatment per GI/admit 2. ESRD - on HD MWF. Had short HD OP on 4/22 < 1 hr. , had short yesterday 4/23 pre-EGD 2- 3 hr HD. This am K =4.9 volume stable 3. Hyperkalemia -admit 6.1 now resolved 4.9 rx w/ HD 4. Anemia ckd -admit Hgb 7.2, this a.m. Hgb 8.0 next esa Aranesp 200 due  4/27 5. MBD ckd - cont vdra, binders  6. HIV - biktarvy 7. COPD 8. Chron systolic CHF= no shortness of breath hemodynamically stable 9. HTN / vol - up 1kg ,no SOB or resp issues, exam stable , needing standing weights prepostdialysis 10. DM2 - per pmd  Ernest Haber, PA-C [place] [phone] 02/14/2021,11:42 AM  LOS: 2 days   Pt seen, examined and agree w A/P as above.  Kelly Splinter  MD 02/14/2021, 12:37 PM    Labs: Basic Metabolic Panel: Recent Labs  Lab 02/13/21 0637 02/13/21 1253 02/13/21 1309 02/14/21 0613  NA 136 135 137 137  K 5.4* 6.1* 5.4* 4.9  CL 98 99 100 101  CO2 23  --  24 25  GLUCOSE 160* 164* 163* 139*  BUN 138* 108* 91* 117*  CREATININE 8.06* 5.50* 5.52* 7.07*  CALCIUM 9.0  --  8.0* 8.8*   Liver Function Tests: Recent Labs  Lab 02/12/21 0855  AST 16  ALT 12  ALKPHOS 64  BILITOT 0.5  PROT 6.3*  ALBUMIN 2.9*   No results  for input(s): LIPASE, AMYLASE in the last 168 hours. No results for input(s): AMMONIA in the last 168 hours. CBC: Recent Labs  Lab 02/12/21 0855 02/12/21 2054 02/13/21 1309 02/13/21 2113 02/14/21 0057 02/14/21 0613  WBC 5.9  --  10.9* 10.6* 8.0 8.3  NEUTROABS 4.0  --   --   --   --  5.8  HGB 5.9*   < > 6.5* 9.0* 7.7* 8.0*  HCT 20.0*   < > 19.6* 26.1* 23.2* 23.9*  MCV 98.0  --  93.8 88.8 89.6 90.2  PLT 190  --  94* 81* 86* 79*   < > = values in this interval not displayed.   Cardiac Enzymes: No results for input(s): CKTOTAL, CKMB, CKMBINDEX, TROPONINI in the last 168 hours. CBG: Recent Labs  Lab 02/13/21 2012 02/13/21 2056 02/14/21 0015 02/14/21 0337 02/14/21 0728  GLUCAP 161* 132* 128* 118* 133*    Studies/Results: No results found. Medications:  . allopurinol  100 mg Oral Daily  . bictegravir-emtricitabine-tenofovir AF  1 tablet Oral Daily  . calcium acetate  1,334 mg Oral TID with meals  . carvedilol  3.125 mg Oral BID  . Chlorhexidine Gluconate Cloth  6 each Topical Q0600  .  gabapentin  300 mg Oral Daily  . insulin aspart  0-6 Units Subcutaneous Q4H  . isosorbide mononitrate  30 mg Oral Daily  . latanoprost  1 drop Both Eyes QHS  . multivitamin  1 tablet Oral Daily  . nicotine  14 mg Transdermal Daily  . pantoprazole (PROTONIX) IV  40 mg Intravenous Q12H  . timolol  1 drop Both Eyes q AM  . umeclidinium-vilanterol  1 puff Inhalation Daily

## 2021-02-14 NOTE — Progress Notes (Addendum)
Progress Note  Chief Complaint:    GI bleed    Attending physician's note   I have taken an interval history, reviewed the chart and examined the patient. I agree with the Advanced Practitioner's note, impression and recommendations.   S/p enteroscopy with evidence of active GI bleed and duodenal bulb, possible secondary to AVM versus Dieulafoy lesion.  It was intervened endoscopically with epi injection and argon plasma coagulation  Hemoglobin improved s/p PRBC transfusion.  Continue to monitor and transfuse if below 7 Pantoprazole 40 mg twice daily We will add misoprostol before meals and at bedtime for 2 weeks to improve mucosal healing  advance diet as tolerated Hemoglobin remained stable with no further bleeding, okay to discharge home tomorrow from GI standpoint. We will arrange for GI outpatient office follow-up    I have spent 25 minutes of patient care (this includes precharting, chart review, review of results, face-to-face time used for counseling as well as treatment plan and follow-up. The patient was provided an opportunity to ask questions and all were answered. The patient agreed with the plan and demonstrated an understanding of the instructions.  Damaris Hippo , MD [phone]      ASSESSMENT / PLAN:    70 yo male with ESRD on HD MTF, HIV, DM, COPD, CAD post remote CABG / DES 2021, chronic combined systolic and diastolic heart failure, polysubstance abuse, recurrent GI bleeds secondary to AVMs. Admitted 4/22 with anemia and GI bleed.   # Recurrent GI bleed,  requiring 6 units of blood this admission, secondary to actively bleeding AVM vrs Dieulafoy lesion in duodenal bulb s/p epi injection / APC yesterday. --Hgb improved today 7.7 >> 8. --Continue BID PPI.  --Will likely be okay for discharge tomorrow from GI standpoint.  Needs repeat EGD in 6-8 weeks. I wll ask our office to arrange a follow up first to make sure he is medically stable for the  procedure.    # ESRD on HD. Last two sessions cut short due to GI bleed / EGD. Got 2-3 hours HD this am.   # Chronic thrombocytopenia  Enteroscopy 02/13/21 Actively bleeding AVM vs Dieulafoy lesion in the duodenal bulb s/p epi/APC. - Gastric AVM s/p APC - Nodular duodenum.      SUBJECTIVE:   Feels okay. Had one small brown stool this am, no further bleeding. Wants diet advancement.     OBJECTIVE:    Scheduled inpatient medications:  . allopurinol  100 mg Oral Daily  . bictegravir-emtricitabine-tenofovir AF  1 tablet Oral Daily  . calcium acetate  1,334 mg Oral TID with meals  . carvedilol  3.125 mg Oral BID  . Chlorhexidine Gluconate Cloth  6 each Topical Q0600  . gabapentin  300 mg Oral Daily  . insulin aspart  0-6 Units Subcutaneous Q4H  . isosorbide mononitrate  30 mg Oral Daily  . latanoprost  1 drop Both Eyes QHS  . multivitamin  1 tablet Oral Daily  . nicotine  14 mg Transdermal Daily  . pantoprazole (PROTONIX) IV  40 mg Intravenous Q12H  . timolol  1 drop Both Eyes q AM  . umeclidinium-vilanterol  1 puff Inhalation Daily   Continuous inpatient infusions:  PRN inpatient medications: acetaminophen **OR** [DISCONTINUED] acetaminophen, calcium carbonate (dosed in mg elemental calcium), camphor-menthol **AND** hydrOXYzine, cloNIDine, docusate sodium, hydrALAZINE, [DISCONTINUED] ondansetron **OR** ondansetron (ZOFRAN) IV, sorbitol, zolpidem  Vital signs in last 24 hours: Temp:  [97.5 F (36.4 C)-99.8 F (37.7 C)] 98.2 F (36.8  C) (04/24 0729) Pulse Rate:  [86-106] 86 (04/24 0729) Resp:  [12-23] 16 (04/24 0729) BP: (100-149)/(24-98) 106/80 (04/24 0729) SpO2:  [95 %-100 %] 100 % (04/24 0729) Last BM Date: 02/14/21  Intake/Output Summary (Last 24 hours) at 02/14/2021 1139 Last data filed at 02/13/2021 1821 Gross per 24 hour  Intake 1660.67 ml  Output --  Net 1660.67 ml     Physical Exam:  . General: Alert male in NAD . Heart:  Regular rate and rhythm.   . Pulmonary: Normal respiratory effort . Abdomen: Soft, nondistended, nontender. Normal bowel sounds.  . Neurologic: Alert and oriented . Psych: Pleasant. Cooperative.   Filed Weights   02/12/21 0903 02/13/21 0852 02/13/21 1135  Weight: 78.5 kg 76.5 kg 76.5 kg    Intake/Output from previous day: 04/23 0701 - 04/24 0700 In: 1660.7 [P.O.:120; I.V.:300; Blood:990.7; IV Piggyback:250] Out: -350  Intake/Output this shift: No intake/output data recorded.    Lab Results: Recent Labs    02/13/21 2113 02/14/21 0057 02/14/21 0613  WBC 10.6* 8.0 8.3  HGB 9.0* 7.7* 8.0*  HCT 26.1* 23.2* 23.9*  PLT 81* 86* 79*   BMET Recent Labs    02/13/21 0637 02/13/21 1253 02/13/21 1309 02/14/21 0613  NA 136 135 137 137  K 5.4* 6.1* 5.4* 4.9  CL 98 99 100 101  CO2 23  --  24 25  GLUCOSE 160* 164* 163* 139*  BUN 138* 108* 91* 117*  CREATININE 8.06* 5.50* 5.52* 7.07*  CALCIUM 9.0  --  8.0* 8.8*   LFT Recent Labs    02/12/21 0855  PROT 6.3*  ALBUMIN 2.9*  AST 16  ALT 12  ALKPHOS 64  BILITOT 0.5   PT/INR Recent Labs    02/13/21 2113  LABPROT 15.3*  INR 1.2   Hepatitis Panel No results for input(s): HEPBSAG, HCVAB, HEPAIGM, HEPBIGM in the last 72 hours.  No results found.   Principal Problem:   Acute upper GI bleeding Active Problems:   Human immunodeficiency virus (HIV) disease ([place])   Mixed hyperlipidemia   TOBACCO ABUSE   Essential hypertension   Coronary artery disease involving native heart without angina pectoris   Chronic combined systolic (congestive) and diastolic (congestive) heart failure ([place])   Type 2 diabetes mellitus with diabetic polyneuropathy, with long-term current use of insulin ([place])   ESRD on hemodialysis ([place])     LOS: 2 days   [name] Savoy ,NP 02/14/2021, 11:39 AM

## 2021-02-14 NOTE — Evaluation (Signed)
Physical Therapy Evaluation Patient Details Name: Jacob [name] MRN: [id number] DOB: 10-25-1950 Today's Date: 02/14/2021   History of Present Illness  Pt is a 70 y.o. male admitted 02/12/21 with recurrent upper GIB with associated anemia. S/p enteroscopy 4/23. PMH includes DM, CAD, HIV, HTN, CHF, ESRD (HD MWF), chronic back pain, prior cocaine abuse. Of note, recent hospitalization 10/2020 with fall, small SDH, L greater trochanter fx.    Clinical Impression  Pt presents with an overall decrease in functional mobility secondary to above. PTA, pt reports mod indep with SPC, lives alone, does not drive. Today, pt mod indep with bed-level activity; pt adamantly declines attempts at OOB activity due to fatigue and pain s/p enteroscopy yesterday (has been getting up to [facility] with nursing; RN reports pt with some unsteadiness). Max encouragement and education on importance of mobility. Pt would benefit from continued acute PT services to maximize functional mobility and independence prior to d/c home.     Follow Up Recommendations No PT follow up (pending progress)    Equipment Recommendations  None recommended by PT    Recommendations for Other Services       Precautions / Restrictions Precautions Precautions: Fall Restrictions Weight Bearing Restrictions: No      Mobility  Bed Mobility Overal bed mobility: Modified Independent                  Transfers Overall transfer level: Needs assistance               General transfer comment: Pt adamantly declining attempts at [facility] mobility due to c/o fatigue and stomach discomfort; has been getting from bed<>BSC with assist from nursing  Ambulation/Gait             General Gait Details: pt declines  Stairs            Wheelchair Mobility    Modified Rankin (Stroke Patients Only)       Balance Overall balance assessment: Needs assistance   Sitting balance-Leahy Scale: Fair         Standing balance  comment: NT                             Pertinent Vitals/Pain Pain Assessment: 0-10 Pain Score: 6  Pain Location: Stomach Pain Descriptors / Indicators: Sore Pain Intervention(s): Limited activity within patient's tolerance    Home Living Family/patient expects to be discharged to:: Private residence Living Arrangements: Alone Available Help at Discharge: Family;Friend(s);Available PRN/intermittently Type of Home: Apartment Home Access: Stairs to enter Entrance Stairs-Rails: Left Entrance Stairs-Number of Steps: 14 Home Layout: One level Home Equipment: Tub bench;Cane - single point Additional Comments: Chronic R hip pain - pt reports ortho appt next week to discuss potential hip replacement    Prior Function Level of Independence: Independent with assistive device(s)         Comments: Mod indep with SPC. Does not drive; uses transportation services     Hand Dominance   Dominant Hand: Right    Extremity/Trunk Assessment   Upper Extremity Assessment Upper Extremity Assessment: Overall WFL for tasks assessed    Lower Extremity Assessment Lower Extremity Assessment: Generalized weakness;RLE deficits/detail RLE Deficits / Details: Strength functionally >3/5 throughout; pt endorses numbness/tingling in R foot; pain in 3rd toe RLE Sensation: decreased light touch       Communication   Communication: No difficulties  Cognition Arousal/Alertness: Awake/alert Behavior During Therapy: Flat affect Overall Cognitive Status: Within Functional  Limits for tasks assessed                                 General Comments: [facility] for simple tasks; difficult to reason with regarding importance of mobility      General Comments General comments (skin integrity, edema, etc.): Pt reports it's "too soon" to work with PT after having procedure (enteroscopy) yesterday; educ on importance of mobility, activity recommendations, MD orders, pt still declines OOB  activity. Pt initially hesistant to stay on acute caseload, but ultimately requests PT return for another session. Educ on importance of foot skin checks secondary to R foot decreased sensation    Exercises     Assessment/Plan    PT Assessment Patient needs continued PT services  PT Problem List Decreased activity tolerance;Decreased balance;Decreased mobility;Pain       PT Treatment Interventions DME instruction;Gait training;Stair training;Functional mobility training;Therapeutic activities;Therapeutic exercise;Balance training;Patient/family education    PT Goals (Current goals can be found in the Care Plan section)  Acute Rehab PT Goals Patient Stated Goal: Rest and get home PT Goal Formulation: With patient Time For Goal Achievement: 02/28/21 Potential to Achieve Goals: Good    Frequency Min 3X/week   Barriers to discharge Decreased caregiver support      Co-evaluation               AM-PAC PT "6 Clicks" Mobility  Outcome Measure Help needed turning from your back to your side while in a flat bed without using bedrails?: None Help needed moving from lying on your back to sitting on the side of a flat bed without using bedrails?: A Little Help needed moving to and from a bed to a chair (including a wheelchair)?: A Little Help needed standing up from a chair using your arms (e.g., wheelchair or bedside chair)?: A Little Help needed to walk in hospital room?: A Little Help needed climbing 3-5 steps with a railing? : A Little 6 Click Score: 19    End of Session   Activity Tolerance: Patient limited by fatigue;Patient limited by pain Patient left: in bed;with call bell/phone within reach;with bed alarm set Nurse Communication: Mobility status PT Visit Diagnosis: Other abnormalities of gait and mobility (R26.89)    Time: 5284-1324 PT Time Calculation (min) (ACUTE ONLY): 16 min   Charges:   PT Evaluation $PT Eval Moderate Complexity: [place],  PT, DPT Acute Rehabilitation Services  Pager [phone] Office [place] 02/14/2021, 4:19 PM

## 2021-02-15 ENCOUNTER — Inpatient Hospital Stay (HOSPITAL_COMMUNITY): Payer: Medicare Other

## 2021-02-15 ENCOUNTER — Telehealth: Payer: Self-pay

## 2021-02-15 ENCOUNTER — Encounter (HOSPITAL_COMMUNITY): Payer: Self-pay | Admitting: Gastroenterology

## 2021-02-15 ENCOUNTER — Other Ambulatory Visit: Payer: Self-pay

## 2021-02-15 DIAGNOSIS — K5521 Angiodysplasia of colon with hemorrhage: Secondary | ICD-10-CM

## 2021-02-15 LAB — BASIC METABOLIC PANEL
Anion gap: 13 (ref 5–15)
BUN: 111 mg/dL — ABNORMAL HIGH (ref 8–23)
CO2: 22 mmol/L (ref 22–32)
Calcium: 8.9 mg/dL (ref 8.9–10.3)
Chloride: 100 mmol/L (ref 98–111)
Creatinine, Ser: 7.58 mg/dL — ABNORMAL HIGH (ref 0.61–1.24)
GFR, Estimated: 7 mL/min — ABNORMAL LOW (ref >60)
Glucose, Bld: 122 mg/dL — ABNORMAL HIGH (ref 70–99)
Potassium: 5.2 mmol/L — ABNORMAL HIGH (ref 3.5–5.1)
Sodium: 135 mmol/L (ref 135–145)

## 2021-02-15 LAB — CBC
HCT: 23.3 % — ABNORMAL LOW (ref 39.0–52.0)
HCT: 27.2 % — ABNORMAL LOW (ref 39.0–52.0)
Hemoglobin: 7.4 g/dL — ABNORMAL LOW (ref 13.0–17.0)
Hemoglobin: 9.1 g/dL — ABNORMAL LOW (ref 13.0–17.0)
MCH: 30.1 pg (ref 26.0–34.0)
MCH: 30.6 pg (ref 26.0–34.0)
MCHC: 31.8 g/dL (ref 30.0–36.0)
MCHC: 33.5 g/dL (ref 30.0–36.0)
MCV: 94.7 fL (ref 80.0–100.0)
MCV: 91.6 fL (ref 80.0–100.0)
Platelets: 96 10*3/uL — ABNORMAL LOW (ref 150–400)
Platelets: 68 10*3/uL — ABNORMAL LOW (ref 150–400)
RBC: 2.46 MIL/uL — ABNORMAL LOW (ref 4.22–5.81)
RBC: 2.97 MIL/uL — ABNORMAL LOW (ref 4.22–5.81)
RDW: 16.1 % — ABNORMAL HIGH (ref 11.5–15.5)
RDW: 16.6 % — ABNORMAL HIGH (ref 11.5–15.5)
WBC: 6.2 10*3/uL (ref 4.0–10.5)
WBC: 5.5 10*3/uL (ref 4.0–10.5)
nRBC: 0 % (ref 0.0–0.2)
nRBC: 0 % (ref 0.0–0.2)

## 2021-02-15 LAB — GLUCOSE, CAPILLARY
Glucose-Capillary: 107 mg/dL — ABNORMAL HIGH (ref 70–99)
Glucose-Capillary: 79 mg/dL (ref 70–99)
Glucose-Capillary: 200 mg/dL — ABNORMAL HIGH (ref 70–99)
Glucose-Capillary: 118 mg/dL — ABNORMAL HIGH (ref 70–99)

## 2021-02-15 LAB — HEMOGLOBIN AND HEMATOCRIT, BLOOD
HCT: 29.7 % — ABNORMAL LOW (ref 39.0–52.0)
Hemoglobin: 10.2 g/dL — ABNORMAL LOW (ref 13.0–17.0)

## 2021-02-15 LAB — PREPARE RBC (CROSSMATCH)

## 2021-02-15 MED ORDER — SODIUM CHLORIDE 0.9% IV SOLUTION: Freq: Once | INTRAVENOUS | Status: DC

## 2021-02-15 MED ORDER — IOHEXOL 350 MG/ML SOLN
75.0000 mL | Freq: Once | INTRAVENOUS | Status: AC | PRN
  Administered 2021-02-15: 75 mL via INTRAVENOUS

## 2021-02-15 NOTE — Progress Notes (Addendum)
IR was requested for "recurent UGI AVM bleed, may need embolization."   Patient had drop in hgb from 8 to 7 this morning and had black stool.  VSS stable, not in acute distress this morning per Dr. Candiss Norse.   Discussed with Dr. Dwaine Gale who suggested CTA abdomen prior to IR intervetion. CTA ordered.   Will review the result and determine plan.    Armando Gang Maile Linford PA-C 02/15/2021 12:55 PM

## 2021-02-15 NOTE — Progress Notes (Signed)
Subjective: seen on HD today.   Tol treatment well.  Frustrated by recurrent GI bleeding.  For 2u pRBC with HD today.   Objective Vital signs in last 24 hours: Vitals:   02/15/21 0400 02/15/21 0750 02/15/21 0755 02/15/21 0805  BP: 118/61  (!) 108/43 (!) 103/53  Pulse: 69  68   Resp: 10  12 14   Temp: 97.7 F (36.5 C)  97.9 F (36.6 C)   TempSrc: Oral  Oral   SpO2: 100%  100%   Weight:  80.5 kg    Height:       Weight change:   Physical Exam: General: Alert, NAD Heart: RRR no MRG Lungs: CTA nonlabored breathing Abdomen:soft Extremities: No pedal edema Dialysis Access: LUE AVF +t/b   Home meds:  - allopurinol 100/ colchicine prn  - sensipar tiw/ phoslo 2 ac tid  - protonix 40 bid  - imdur 30 qd/ sl ntg prn/ asa 81  - anoro ellipta 1 puff qd  - biktarvy 1 qd  - coreg 3.125 bid  - neurontin 300 qd  - insulin lispro 20u tid ac prn  - prn's/ vitamins/ supplements    OP HD: [facility] MWF  4h  400/500  75kg  2/2 bath  AVF  15g  Hep none  - mircera 225 ug last 4/13 , due 4/27  - calc 3.75 ut tiw po    OP HD: [facility] MWF  4h  400/500  75kg  2/2 bath  AVF  15g  Hep none  - mircera 225 ug last 4/13 , due 4/27  - calc 3.75 ut tiw po    Problem/Plan: 1. GI bleed - recurrent issue, prior AVM"s / gastritis, 2 units transfuse packed red blood cells 4/23, EGD 4/23 noted active bleeding AVM versus Dieulafoy lesion duodenum/gastric AVM status post APC treatment per GI/admit. Another 2 u pRBC today and cytotec x 2 wks noted.  PPI BID. 2. ESRD - on HD MWF. HD on schedule today.  3. Hyperkalemia -2K dialysate, renal diet.  4. Anemia ckd and GIB -admit Hgb 7.2, this a.m. Hgb 7.4 from 8 yesterday. Next esa Aranesp 200 due 4/27 5. MBD ckd - cont vdra, binders  6. HIV - biktarvy 7. COPD 8. Chron systolic CHF= no shortness of breath hemodynamically stable 9. HTN / vol - pre HD wt 80.5kg, UF goal today  10. DM2 - per pmd  Jannifer Hick MD [facility] Pager  [phone]    Labs: Basic Metabolic Panel: Recent Labs  Lab 02/13/21 1309 02/14/21 0613 02/15/21 0651  NA 137 137 135  K 5.4* 4.9 5.2*  CL 100 101 100  CO2 24 25 22   GLUCOSE 163* 139* 122*  BUN 91* 117* 111*  CREATININE 5.52* 7.07* 7.58*  CALCIUM 8.0* 8.8* 8.9   Liver Function Tests: Recent Labs  Lab 02/12/21 0855  AST 16  ALT 12  ALKPHOS 64  BILITOT 0.5  PROT 6.3*  ALBUMIN 2.9*   No results for input(s): LIPASE, AMYLASE in the last 168 hours. No results for input(s): AMMONIA in the last 168 hours. CBC: Recent Labs  Lab 02/12/21 0855 02/12/21 2054 02/13/21 2113 02/14/21 0057 02/14/21 0613 02/14/21 1836 02/15/21 0651  WBC 5.9   < > 10.6* 8.0 8.3 6.5 6.2  NEUTROABS 4.0  --   --   --  5.8  --   --   HGB 5.9*   < > 9.0* 7.7* 8.0* 7.0* 7.4*  HCT 20.0*   < > 26.1*  23.2* 23.9* 21.2* 23.3*  MCV 98.0   < > 88.8 89.6 90.2 92.6 94.7  PLT 190   < > 81* 86* 79* 85* 96*   < > = values in this interval not displayed.   Cardiac Enzymes: No results for input(s): CKTOTAL, CKMB, CKMBINDEX, TROPONINI in the last 168 hours. CBG: Recent Labs  Lab 02/14/21 1217 02/14/21 1733 02/14/21 1928 02/14/21 2330 02/15/21 0440  GLUCAP 127* 156* 167* 168* 107*    Studies/Results: No results found. Medications:  . sodium chloride   Intravenous Once  . allopurinol  100 mg Oral Daily  . bictegravir-emtricitabine-tenofovir AF  1 tablet Oral Daily  . calcium acetate  1,334 mg Oral TID with meals  . carvedilol  3.125 mg Oral BID  . Chlorhexidine Gluconate Cloth  6 each Topical Q0600  . gabapentin  300 mg Oral Daily  . insulin aspart  0-6 Units Subcutaneous Q4H  . isosorbide mononitrate  30 mg Oral Daily  . latanoprost  1 drop Both Eyes QHS  . misoprostol  100 mcg Oral Q6H  . multivitamin  1 tablet Oral Daily  . nicotine  14 mg Transdermal Daily  . pantoprazole (PROTONIX) IV  40 mg Intravenous Q12H  . timolol  1 drop Both Eyes q AM  . umeclidinium-vilanterol  1 puff  Inhalation Daily

## 2021-02-15 NOTE — Progress Notes (Addendum)
Daily Rounding Note  02/15/2021, 12:20 PM  LOS: 3 days   SUBJECTIVE:   Chief complaint: Melena.  Blood loss anemia.  Bleeding AVM versus bleeding Dula Foy's lesion.  No further stools yesterday or today by patient's own report.  However when I spoke with the IR PA she had been told that the patient had a dark stool sometime overnight.  IR was asked to see the patient because the Hgb had gone from 8-7 yesterday but it 7.4 this morning. Patient very frustrated that he keeps having these recurrent bleeds.  Also frustrated because he is not being allowed to eat anything other than full liquids. I see that he is on the schedule for CT angiography in IR today, its not clear to me who ordered this so I messaged the interventional PA to discuss this as it may not be necessary.  OBJECTIVE:         Vital signs in last 24 hours:    Temp:  [97.6 F (36.4 C)-98.6 F (37 C)] 98.5 F (36.9 C) (04/25 1030) Pulse Rate:  [68-83] 83 (04/25 1129) Resp:  [10-20] 13 (04/25 0940) BP: (103-141)/(39-109) 131/60 (04/25 1129) SpO2:  [99 %-100 %] 100 % (04/25 1030) Weight:  [80.5 kg] 80.5 kg (04/25 0750) Last BM Date: 02/14/21 Filed Weights   02/13/21 0852 02/13/21 1135 02/15/21 0750  Weight: 76.5 kg 76.5 kg 80.5 kg   General: Looks well.  Frustrated.  Comfortable. Heart: RRR Chest: No labored breathing or cough Abdomen: Not tender or distended.  Active bowel sounds. Extremities: No CCE. Neuro/Psych: Oriented x3.  Fully alert.  Asking appropriate questions.  Intake/Output from previous day: 04/24 0701 - 04/25 0700 In: 249.7 [P.O.:200; IV Piggyback:49.7] Out: 300 [Urine:300]  Intake/Output this shift: Total I/O In: 21.7 [Blood:21.7] Out: -   Lab Results: Recent Labs    02/14/21 0613 02/14/21 1836 02/15/21 0651  WBC 8.3 6.5 6.2  HGB 8.0* 7.0* 7.4*  HCT 23.9* 21.2* 23.3*  PLT 79* 85* 96*   BMET Recent Labs    02/13/21 1309  02/14/21 0613 02/15/21 0651  NA 137 137 135  K 5.4* 4.9 5.2*  CL 100 101 100  CO2 24 25 22   GLUCOSE 163* 139* 122*  BUN 91* 117* 111*  CREATININE 5.52* 7.07* 7.58*  CALCIUM 8.0* 8.8* 8.9   LFT No results for input(s): PROT, ALBUMIN, AST, ALT, ALKPHOS, BILITOT, BILIDIR, IBILI in the last 72 hours. PT/INR Recent Labs    02/13/21 2113  LABPROT 15.3*  INR 1.2   Hepatitis Panel No results for input(s): HEPBSAG, HCVAB, HEPAIGM, HEPBIGM in the last 72 hours.  Studies/Results: No results found.  ASSESMENT:   *   Acute upper GI bleed, tarry melenic stool Enteroscopy with active bleeding at duodenal bulb, ?  Secondary to AVM vs Dielafoy lesion.  Area treated with epi injection and APC.  Nonbleeding gastric AVM in the gastric body, treated with APC.  Nodular duodenum. Continues Protonix 40 mg bid.  2 weeks of AC/ HS misoprostol added 4/24. Several recent EGDs and enteroscopy's on 3/1, 3/19 and 01/25/2021.  At 01/25/2021 pathology revealed chronic active gastritis with H. pylori.  He was started on Flagyl, bismuth, Doxycyclin, omeprazole for 10-day course with plans for urea breath test in about a month.  *     Blood loss anemia. 6 PRBCs thus far.  Hgb stable: Since yesterday morning 7.7 >> 8 > 7 >> 7.4 today. Nadir 6.1.  *  thrombocytopenia.  *    ESRD.   PLAN   *    Spoke with the IR PA and there are no plans to take him for an intervention so it is okay to feed the patient today  Azucena Freed  02/15/2021, 12:20 PM Phone [phone]  GI ATTENDING  Interval history data reviewed.  Agree with interval progress note.  Patient well-known to our service.  Recurrent GI bleed secondary to AVMs of the stomach and small bowel.  Has had multiple therapeutic endoscopies.  Patient's principal risk factor is his renal disease.  He had been on anticoagulation therapy previously.  His frustration is understandable, but unfortunately this is the nature of this problem, often.  Agree with  advancing diet and monitoring stools and blood counts.  When all is stable, okay for discharge.  He should be monitored closely at the time of dialysis for as needed transfusion to hopefully reduce the need for hospitalization.  Docia Chuck. Geri Seminole., M.D. [facility] Division of Gastroenterology

## 2021-02-15 NOTE — Progress Notes (Signed)
PT Cancellation Note  Patient Details Name: Jacob [name] MRN: [id number] DOB: September 18, 1951   Cancelled Treatment:    Reason Eval/Treat Not Completed: Patient at procedure or test/unavailable - at HD, will check back.  Stacie Glaze, PT DPT Acute Rehabilitation Services Pager [phone]  Office [phone]    Jacob [name] 02/15/2021, 8:42 AM

## 2021-02-15 NOTE — Procedures (Signed)
I was present at this dialysis session, have reviewed the session itself and made  appropriate changes Tolerating treatment well.  AVF no issues.  D/w RN bedside.  For 2u pRBC with treatment today. Jannifer Hick MD [facility] pager [phone]   02/15/2021, 9:08 AM

## 2021-02-15 NOTE — Progress Notes (Signed)
PROGRESS NOTE                                                                                                                                                                                                             Patient Demographics:    Jacob [name], is a 70 y.o. male, DOB - 1950/11/15, TKZ:[id number]  Outpatient Primary MD for the patient is [name], NP    LOS - 3  Admit date - 02/12/2021    Chief Complaint  Patient presents with  . Abdominal Pain  . Rectal Bleeding       Brief Narrative (HPI from H&P) -  Jacob [name] is a 70 y.o. male with medical history significant of DM; CAD; HIV; HTN; chronic systolic CHF; ESRD on MWF HD; prior cocaine abuse; and chronic back pain presenting with GI bleeding.  He was last hospitalized from 4/3-5 with the same; EGD showed AVMs treated with APC with home PPI and ASA held through 4/10.  He reports that he started ASA again on 4/10 as instructed.  He had no apparent issues until last night, when he woke up overnight feeling bloated followed by black stool, he was diagnosed with UGI bleed and admitted.   Subjective:   Patient in bed, appears comfortable, denies any headache, no fever, no chest pain or pressure, no shortness of breath , no abdominal pain, did have some dark stools . No new focal weakness.   Assessment  & Plan :    1. Recurrent UGI bleeding causing acute blood loss related anemia.  This happened few weeks ago when EGD showed upper GI AVMs in the Duodenum, repeat EGD on 02/13/21 showed Actively bleeding AVM vs Dieulafoy lesion in the duodenal bulb s/p epi/APC.  Gastric AVM s/p APC, he has received 2 units of packed RBC on 02/13/2021, and getting another 2 units on 02/15/2021 with dialysis, on combination of vasopressin and IV PPI , clears, monitor H&H, GI following, also have consulted IR in case rebleeding occurs and he requires embolization.  2.  ESRD.  MWF  schedule, Renal on board.  3.  Essential hypertension.  Placed on scheduled oral home medications along with as needed blood pressure medications as well.  4.  CAD with chronic combined systolic and diastolic heart failure EF 40%.  No acute issues, holding ASA due  to GI bleed, currently compensated.  Coreg and Imdur will be continued.  5.  History of HIV.  CD4 count 6 months ago was 403, on Biktarvy continue  6.  COPD.  Supportive care.  7.  History of glaucoma.  On home eyedrops.  8.  Polysubstance abuse including EtOH, cocaine and tobacco abuse.  Counseled to quit.  Says has not had alcohol in months, does not give a clear answer on cocaine use. UDS negative.  9.  DM type II.  For now every 4 hours Accu-Cheks with sliding scale.  Lab Results  Component Value Date   HGBA1C 4.6 12/15/2020   CBG (last 3)  Recent Labs    02/14/21 1928 02/14/21 2330 02/15/21 0440  GLUCAP 167* 168* 107*        Condition - Extremely Guarded  Family Communication  :   Tivis Ringer [phone] 02/13/21   Code Status :  Full  Consults  :  GI, Renal, IR  PUD Prophylaxis : PPI   Procedures  :     EGD - Actively bleeding AVM vs Dieulafoy lesion in the duodenal bulb s/p epi/APC.  Gastric AVM s/p APC. Nodular duodenum.      Disposition Plan  :    Status is: Inpatient  Remains inpatient appropriate because:IV treatments appropriate due to intensity of illness or inability to take PO   Dispo: The patient is from: Home              Anticipated d/c is to: Home              Patient currently is not medically stable to d/c.   Difficult to place patient No  DVT Prophylaxis  :   SCDs    Lab Results  Component Value Date   PLT 96 (L) 02/15/2021    Diet :  Diet Order            Diet full liquid Room service appropriate? Yes; Fluid consistency: Thin  Diet effective now                  Inpatient Medications  Scheduled Meds: . sodium chloride   Intravenous Once  . allopurinol   100 mg Oral Daily  . bictegravir-emtricitabine-tenofovir AF  1 tablet Oral Daily  . calcium acetate  1,334 mg Oral TID with meals  . carvedilol  3.125 mg Oral BID  . Chlorhexidine Gluconate Cloth  6 each Topical Q0600  . gabapentin  300 mg Oral Daily  . insulin aspart  0-6 Units Subcutaneous Q4H  . isosorbide mononitrate  30 mg Oral Daily  . latanoprost  1 drop Both Eyes QHS  . misoprostol  100 mcg Oral Q6H  . multivitamin  1 tablet Oral Daily  . nicotine  14 mg Transdermal Daily  . pantoprazole (PROTONIX) IV  40 mg Intravenous Q12H  . timolol  1 drop Both Eyes q AM  . umeclidinium-vilanterol  1 puff Inhalation Daily   Continuous Infusions:  PRN Meds:.acetaminophen **OR** [DISCONTINUED] acetaminophen, calcium carbonate (dosed in mg elemental calcium), camphor-menthol **AND** hydrOXYzine, cloNIDine, docusate sodium, hydrALAZINE, [DISCONTINUED] ondansetron **OR** ondansetron (ZOFRAN) IV, sorbitol, zolpidem  Antibiotics  :    Anti-infectives (From admission, onward)   Start     Dose/Rate Route Frequency Ordered Stop   02/12/21 1230  bictegravir-emtricitabine-tenofovir AF (BIKTARVY) 50-200-25 MG per tablet 1 tablet        1 tablet Oral Daily 02/12/21 1209         Time Spent in  minutes  30   Lala Lund M.D on 02/15/2021 at 9:29 AM  To page go to [email or web address]   [facility] -  Office  [phone]    See all Orders from today for further details    Objective:   Vitals:   02/15/21 0805 02/15/21 0830 02/15/21 0900 02/15/21 0924  BP: (!) 103/53 (!) 123/52 126/65 (!) 105/39  Pulse:    80  Resp: 14   17  Temp:    98.6 F (37 C)  TempSrc:    Oral  SpO2:    100%  Weight:      Height:        Wt Readings from Last 3 Encounters:  02/15/21 80.5 kg  02/04/21 78.1 kg  01/25/21 74.7 kg     Intake/Output Summary (Last 24 hours) at 02/15/2021 0929 Last data filed at 02/15/2021 0200 Gross per 24 hour  Intake 249.66 ml  Output 300 ml  Net -50.34 ml      Physical Exam  Awake Alert, No new F.N deficits, Normal affect [place].AT,PERRAL Supple Neck,No JVD, No cervical lymphadenopathy appriciated.  Symmetrical Chest wall movement, Good air movement bilaterally, CTAB RRR,No Gallops, Rubs or new Murmurs, No Parasternal Heave +ve B.Sounds, Abd Soft, No tenderness, No organomegaly appriciated, No rebound - guarding or rigidity. No Cyanosis, Clubbing or edema, No new Rash or bruise    Data Review:    CBC Recent Labs  Lab 02/12/21 0855 02/12/21 2054 02/13/21 2113 02/14/21 0057 02/14/21 0613 02/14/21 1836 02/15/21 0651  WBC 5.9   < > 10.6* 8.0 8.3 6.5 6.2  HGB 5.9*   < > 9.0* 7.7* 8.0* 7.0* 7.4*  HCT 20.0*   < > 26.1* 23.2* 23.9* 21.2* 23.3*  PLT 190   < > 81* 86* 79* 85* 96*  MCV 98.0   < > 88.8 89.6 90.2 92.6 94.7  MCH 28.9   < > 30.6 29.7 30.2 30.6 30.1  MCHC 29.5*   < > 34.5 33.2 33.5 33.0 31.8  RDW 18.1*   < > 15.5 15.7* 16.0* 16.3* 16.1*  LYMPHSABS 1.2  --   --   --  1.4  --   --   MONOABS 0.5  --   --   --  1.0  --   --   EOSABS 0.1  --   --   --  0.2  --   --   BASOSABS 0.0  --   --   --  0.0  --   --    < > = values in this interval not displayed.    Recent Labs  Lab 02/12/21 0855 02/13/21 7425 02/13/21 1253 02/13/21 1309 02/13/21 2113 02/14/21 0057 02/14/21 0613 02/15/21 0651  NA 139 136 135 137  --   --  137 135  K 4.7 5.4* 6.1* 5.4*  --   --  4.9 5.2*  CL 99 98 99 100  --   --  101 100  CO2 28 23  --  24  --   --  25 22  GLUCOSE 124* 160* 164* 163*  --   --  139* 122*  BUN 77* 138* 108* 91*  --   --  117* 111*  CREATININE 6.76* 8.06* 5.50* 5.52*  --   --  7.07* 7.58*  CALCIUM 8.8* 9.0  --  8.0*  --   --  8.8* 8.9  AST 16  --   --   --   --   --   --   --  ALT 12  --   --   --   --   --   --   --   ALKPHOS 64  --   --   --   --   --   --   --   BILITOT 0.5  --   --   --   --   --   --   --   ALBUMIN 2.9*  --   --   --   --   --   --   --   MG  --   --   --   --   --   --  1.7  --   INR  --   --   --    --  1.2  --   --   --   BNP  --   --   --   --   --  343.7*  --   --     ------------------------------------------------------------------------------------------------------------------ No results for input(s): CHOL, HDL, LDLCALC, TRIG, CHOLHDL, LDLDIRECT in the last 72 hours.  Lab Results  Component Value Date   HGBA1C 4.6 12/15/2020   ------------------------------------------------------------------------------------------------------------------ No results for input(s): TSH, T4TOTAL, T3FREE, THYROIDAB in the last 72 hours.  Invalid input(s): FREET3  Cardiac Enzymes No results for input(s): CKMB, TROPONINI, MYOGLOBIN in the last 168 hours.  Invalid input(s): CK ------------------------------------------------------------------------------------------------------------------    Component Value Date/Time   BNP 343.7 (H) 02/14/2021 0057    Micro Results Recent Results (from the past 240 hour(s))  SARS CORONAVIRUS 2 (TAT 6-24 HRS) Nasopharyngeal Nasopharyngeal Swab     Status: None   Collection Time: 02/12/21 12:18 PM   Specimen: Nasopharyngeal Swab  Result Value Ref Range Status   SARS Coronavirus 2 NEGATIVE NEGATIVE Final    Comment: (NOTE) SARS-CoV-2 target nucleic acids are NOT DETECTED.  The SARS-CoV-2 RNA is generally detectable in upper and lower respiratory specimens during the acute phase of infection. Negative results do not preclude SARS-CoV-2 infection, do not rule out co-infections with other pathogens, and should not be used as the sole basis for treatment or other patient management decisions. Negative results must be combined with clinical observations, patient history, and epidemiological information. The expected result is Negative.  Fact Sheet for Patients: SugarRoll.be  Fact Sheet for Healthcare Providers: https://www.woods-mathews.com/  This test is not yet approved or cleared by the [place] FDA  and  has been authorized for detection and/or diagnosis of SARS-CoV-2 by FDA under an Emergency Use Authorization (EUA). This EUA will remain  in effect (meaning this test can be used) for the duration of the COVID-19 declaration under Se ction 564(b)(1) of the Act, 21 U.S.C. section 360bbb-3(b)(1), unless the authorization is terminated or revoked sooner.  Performed at [facility], [place] [address] [place], [place] [address]   MRSA PCR Screening     Status: None   Collection Time: 02/12/21 10:00 PM   Specimen: Nasal Mucosa; Nasopharyngeal  Result Value Ref Range Status   MRSA by PCR NEGATIVE NEGATIVE Final    Comment:        The GeneXpert MRSA Assay (FDA approved for NASAL specimens only), is one component of a comprehensive MRSA colonization surveillance program. It is not intended to diagnose MRSA infection nor to guide or monitor treatment for MRSA infections. Performed at [facility], [place] [address] [place], [place] [id number]     Radiology Reports DG CHEST PORT 1 VIEW  Result Date: 01/24/2021  CLINICAL DATA:  Shortness of breath. EXAM: PORTABLE CHEST 1 VIEW COMPARISON:  12/11/2020 FINDINGS: Lungs are adequately inflated without focal airspace consolidation or effusion. Subtle hazy prominence of the perihilar markings unchanged likely mild chronic vascular congestion. Moderate stable cardiomegaly. Remainder of the exam is unchanged. IMPRESSION: Moderate stable cardiomegaly with suggestion of minimal chronic vascular congestion. Electronically Signed   By: Marin Olp M.D.   On: 01/24/2021 15:01

## 2021-02-15 NOTE — Telephone Encounter (Signed)
Patient is still hospitalized. Appointment scheduled and information mailed to the patient's address.

## 2021-02-15 NOTE — Progress Notes (Signed)
Pt off unit to dialysis

## 2021-02-15 NOTE — Telephone Encounter (Signed)
-----   Message from Willia Craze, NP sent at 02/14/2021 12:34 PM EDT ----- Eustaquio Maize, please get this patient a hospital follow up with [facility] in about 3 weeks. He was admitted with GI bleed.  Thanks

## 2021-02-16 ENCOUNTER — Ambulatory Visit: Payer: Medicare Other | Admitting: Orthopaedic Surgery

## 2021-02-16 LAB — BPAM RBC
Blood Product Expiration Date: 202205092359
Blood Product Expiration Date: 202205092359
Blood Product Expiration Date: 202205092359
Blood Product Expiration Date: 202205092359
Blood Product Expiration Date: 202205132359
Blood Product Expiration Date: 202205132359
Blood Product Expiration Date: 202205172359
Blood Product Expiration Date: 202205172359
Blood Product Expiration Date: 202205172359
Blood Product Expiration Date: 202205182359
ISSUE DATE / TIME: 202204210955
ISSUE DATE / TIME: 202204221139
ISSUE DATE / TIME: 202204221521
ISSUE DATE / TIME: 202204230008
ISSUE DATE / TIME: 202204230805
ISSUE DATE / TIME: 202204231258
ISSUE DATE / TIME: 202204231415
ISSUE DATE / TIME: 202204250915
ISSUE DATE / TIME: 202204250915
Unit Type and Rh: 6200
Unit Type and Rh: 6200
Unit Type and Rh: 6200
Unit Type and Rh: 6200
Unit Type and Rh: 6200
Unit Type and Rh: 6200
Unit Type and Rh: 6200
Unit Type and Rh: 6200
Unit Type and Rh: 6200
Unit Type and Rh: 6200

## 2021-02-16 LAB — TYPE AND SCREEN
ABO/RH(D): A POS
Antibody Screen: NEGATIVE
Unit division: 0
Unit division: 0
Unit division: 0
Unit division: 0
Unit division: 0
Unit division: 0
Unit division: 0
Unit division: 0
Unit division: 0
Unit division: 0

## 2021-02-16 LAB — BASIC METABOLIC PANEL
Anion gap: 11 (ref 5–15)
BUN: 47 mg/dL — ABNORMAL HIGH (ref 8–23)
CO2: 26 mmol/L (ref 22–32)
Calcium: 8 mg/dL — ABNORMAL LOW (ref 8.9–10.3)
Chloride: 96 mmol/L — ABNORMAL LOW (ref 98–111)
Creatinine, Ser: 5.52 mg/dL — ABNORMAL HIGH (ref 0.61–1.24)
GFR, Estimated: 10 mL/min — ABNORMAL LOW (ref >60)
Glucose, Bld: 110 mg/dL — ABNORMAL HIGH (ref 70–99)
Potassium: 4.1 mmol/L (ref 3.5–5.1)
Sodium: 133 mmol/L — ABNORMAL LOW (ref 135–145)

## 2021-02-16 LAB — CBC
HCT: 28.6 % — ABNORMAL LOW (ref 39.0–52.0)
Hemoglobin: 9.5 g/dL — ABNORMAL LOW (ref 13.0–17.0)
MCH: 30.6 pg (ref 26.0–34.0)
MCHC: 33.2 g/dL (ref 30.0–36.0)
MCV: 92.3 fL (ref 80.0–100.0)
Platelets: 74 10*3/uL — ABNORMAL LOW (ref 150–400)
RBC: 3.1 MIL/uL — ABNORMAL LOW (ref 4.22–5.81)
RDW: 16.6 % — ABNORMAL HIGH (ref 11.5–15.5)
WBC: 5.8 10*3/uL (ref 4.0–10.5)
nRBC: 0.3 % — ABNORMAL HIGH (ref 0.0–0.2)

## 2021-02-16 LAB — GLUCOSE, CAPILLARY
Glucose-Capillary: 121 mg/dL — ABNORMAL HIGH (ref 70–99)
Glucose-Capillary: 134 mg/dL — ABNORMAL HIGH (ref 70–99)
Glucose-Capillary: 138 mg/dL — ABNORMAL HIGH (ref 70–99)

## 2021-02-16 MED ORDER — PANTOPRAZOLE SODIUM 40 MG PO TBEC: 40.0000 mg | DELAYED_RELEASE_TABLET | Freq: Two times a day (BID) | ORAL | 2 refills | Status: DC

## 2021-02-16 MED ORDER — MISOPROSTOL 100 MCG PO TABS: 100.0000 ug | ORAL_TABLET | Freq: Four times a day (QID) | ORAL | 0 refills | Status: DC

## 2021-02-16 NOTE — Progress Notes (Signed)
IR was requested for "recurent UGI AVM bleed, may need embolization."   4/25: Patient had drop in hgb from 8 to 7 this morning and had black stool.  VSS stable, not in acute distress this morning per Dr. Candiss Norse.  Discussed with Dr. Dwaine Gale who suggested CTA abdomen prior to IR intervetion. CTA ordered.   4/26: Patient is hemodynamically stable with Hgb of 9.5, VSS.  CTA reviewed with Dr. Dwaine Gale.  CTA showed advanced atherosclerotic calcification of the SMA and focal area of high-grade narrowing of the proximal SMA. Per Dr. Dwaine Gale, embolization of GDA could put the patient at high risk for GI ischemia.  No IR intervention is indicated at the moment. Please call IR for question or concerns.    Jacob Vandermeulen Lemmie Evens Aurea Aronov PA-C 02/16/2021 10:09 AM

## 2021-02-16 NOTE — Progress Notes (Signed)
Physical Therapy Treatment Patient Details Name: Jacob [name] MRN: [id number] DOB: 06-26-1951 Today's Date: 02/16/2021    History of Present Illness Pt is a 70 y.o. male admitted 02/12/21 with recurrent upper GIB with associated anemia. S/p enteroscopy 4/23. PMH includes DM, CAD, HIV, HTN, CHF, ESRD (HD MWF), chronic back pain, prior cocaine abuse. Of note, recent hospitalization 10/2020 with fall, small SDH, L greater trochanter fx.    PT Comments    Pt in bed, upset that the doctor had discharged him this morning and his paperwork was not yet finished. Pt reluctantly agreeable to ambulating with PT. Pt is mod I for bed mobility, transfers and ambulation of 40 feet with SPC. Pt does note R lateral foot swelling and decreased sensation with long distance ambulation, which is why he uses SPC. Pt states he thinks it might be related to R hip pain he also gets. Pt reports he had an appointment with [facility] today, but he will have to reschedule. D/c recommendations remain appropriate at this time.    Follow Up Recommendations  No PT follow up (pending progress)     Equipment Recommendations  None recommended by PT       Precautions / Restrictions Precautions Precautions: Fall Restrictions Weight Bearing Restrictions: No    Mobility  Bed Mobility Overal bed mobility: Modified Independent             General bed mobility comments: HoB elevated and use of bedrail    Transfers Overall transfer level: Modified independent               General transfer comment: use of SPC to steady with powerup into standing  Ambulation/Gait Ambulation/Gait assistance: Modified independent (Device/Increase time) Gait Distance (Feet): 40 Feet Assistive device: Straight cane Gait Pattern/deviations: Step-through pattern;Decreased weight shift to right;Trunk flexed Gait velocity: WFL Gait velocity interpretation: 1.31 - 2.62 ft/sec, indicative of limited community ambulator General Gait  Details: uses SPC for steadying,         Balance Overall balance assessment: Needs assistance   Sitting balance-Leahy Scale: Fair     Standing balance support: Single extremity supported Standing balance-Leahy Scale: Poor Standing balance comment: requires SPC for R sided support                            Cognition Arousal/Alertness: Awake/alert Behavior During Therapy: Flat affect Overall Cognitive Status: Within Functional Limits for tasks assessed                                 General Comments: pt frustrated he has not been discharged yet, reporting paperwork is taking too long         General Comments General comments (skin integrity, edema, etc.): Pt reports swelling down lateral side of R foot into 5th toe and decreased sensation, pt states that sometimes that impedes his ability to walk to mailbox and back. Also reports ocassional R hip pain that seems to correlate with R foot symptoms Reports he had appointment with [facility] today but will not be able to make appointment. Plans to reschedule appointment      Pertinent Vitals/Pain Pain Assessment: No/denies pain           PT Goals (current goals can now be found in the care plan section) Acute Rehab PT Goals Patient Stated Goal: Rest and get home PT Goal Formulation: With patient Time For  Goal Achievement: 02/28/21 Potential to Achieve Goals: Good    Frequency    Min 3X/week      PT Plan Current plan remains appropriate       AM-PAC PT "6 Clicks" Mobility   Outcome Measure  Help needed turning from your back to your side while in a flat bed without using bedrails?: None Help needed moving from lying on your back to sitting on the side of a flat bed without using bedrails?: A Little Help needed moving to and from a bed to a chair (including a wheelchair)?: A Little Help needed standing up from a chair using your arms (e.g., wheelchair or bedside chair)?: A Little Help  needed to walk in hospital room?: A Little Help needed climbing 3-5 steps with a railing? : A Little 6 Click Score: 19    End of Session   Activity Tolerance: Patient limited by fatigue;Patient limited by pain Patient left: in bed;with call bell/phone within reach;with bed alarm set Nurse Communication: Mobility status PT Visit Diagnosis: Other abnormalities of gait and mobility (R26.89)     Time: 1610-9604 PT Time Calculation (min) (ACUTE ONLY): 8 min  Charges:  $Gait Training: 8-22 mins                     Jacob [name] B. [name] PT, DPT Acute Rehabilitation Services Pager [phone] Office [phone]    [place] 02/16/2021, 10:26 AM

## 2021-02-16 NOTE — Progress Notes (Signed)
Pt discharged in stable condition with all belongings in his possession and having received discharge teaching. Pt was VERY impatient about leaving and refused to wait for his ride to call to confirm they had arrived. Pt demanded to be left at the front and he would find his way to his ride.

## 2021-02-16 NOTE — Discharge Instructions (Signed)
Follow with Primary MD [name], NP in 7 days   Get CBC, CMP, 2 view Chest X ray -  checked next visit within 1 week by Primary MD    Activity: As tolerated with Full fall precautions use walker/cane & assistance as needed  Disposition Home   Diet: Renal - Low Carb, 1.5lit/day fluid restriction.   Special Instructions: If you have smoked or chewed Tobacco  in the last 2 yrs please stop smoking, stop any regular Alcohol  and or any Recreational drug use.  On your next visit with your primary care physician please Get Medicines reviewed and adjusted.  Please request your Prim.MD to go over all Hospital Tests and Procedure/Radiological results at the follow up, please get all Hospital records sent to your Prim MD by signing hospital release before you go home.  If you experience worsening of your admission symptoms, develop shortness of breath, life threatening emergency, suicidal or homicidal thoughts you must seek medical attention immediately by calling 911 or calling your MD immediately  if symptoms less severe.  You Must read complete instructions/literature along with all the possible adverse reactions/side effects for all the Medicines you take and that have been prescribed to you. Take any new Medicines after you have completely understood and accpet all the possible adverse reactions/side effects.

## 2021-02-16 NOTE — Discharge Summary (Signed)
Jacob [name] HRC:[id number] DOB: 1950-12-23 DOA: 02/12/2021  PCP: [name], NP  Admit date: 02/12/2021  Discharge date: 02/16/2021  Admitted From: Home  Disposition:  Home   Recommendations for Outpatient Follow-up:   Follow up with PCP in 1-2 weeks  PCP Please obtain BMP/CBC, 2 view CXR in 1week,  (see Discharge instructions)   PCP Please follow up on the following pending results: Monitor CBC, aspirin has been discontinued for now, likely okay to resume once cleared by GI to do so in the next 3 to 4-week.   Home Health: None Equipment/Devices: None  Consultations: GI, Renal Discharge Condition: Stable    CODE STATUS: Full    Diet Recommendation: Renal-low carbohydrate diet with 1.5 L fluid restriction per day    Chief Complaint  Patient presents with  . Abdominal Pain  . Rectal Bleeding     Brief history of present illness from the day of admission and additional interim summary    Jacob [name] a 70 y.o.malewith medical history significant ofDM; CAD; HIV; HTN; chronic systolic CHF; ESRD on MWF HD; prior cocaine abuse; and chronic back pain presenting with GI bleeding. He was last hospitalized from 4/3-5 with the same; EGD showed AVMs treated with APC with home PPI and ASA held through 4/10. He reports that he started ASA again on 4/10 as instructed. He had no apparent issues until last night, when he woke up overnight feeling bloated followed by black stool, he was diagnosed with UGI bleed and admitted.                                                                 Hospital Course    1. Recurrent UGI bleeding causing acute blood loss related anemia.  This happened few weeks ago when EGD showed upper GI AVMs in the Duodenum, repeat EGD on 02/13/21 showed Actively bleeding AVM vs  Dieulafoy lesion in the duodenal bulb s/p epi/APC.  Gastric AVM s/p APC, he has received 2 units of packed RBC on 02/13/2021, and getting another 2 units on 02/15/2021 with dialysis, now H&H stable, will be discharged home on twice daily PPI and misoprostol if approved by his insurance, continue to hold aspirin, outpatient PCP and GI follow-up.  Note he refused SNF and refused to stay any longer in the hospital, he says he does not think the physicians in this hospital know what they are doing and he wants to be discharged home right away.  2.  ESRD.  MWF schedule, Renal on board.  3.  Essential hypertension.  Placed on scheduled oral home medications along with as needed blood pressure medications as well.  4.  CAD with chronic combined systolic and diastolic heart failure EF 40%.  No acute issues, holding ASA due to GI bleed, currently compensated.  Coreg and Imdur will be continued.  We will request PCP to readdress aspirin resumption once cleared by GI to do so.  5.  History of HIV.  CD4 count 6 months ago was 403, on Biktarvy continue, follow with Dr. [name] outpatient post discharge.  6.  COPD.  Supportive care.  7.  History of glaucoma.  On home eyedrops.  8.  Polysubstance abuse including EtOH, cocaine and tobacco abuse.  Counseled to quit all.  Says has not had alcohol in months, does not give a clear answer on cocaine use. UDS negative.  9.  DM type II.  For now continue home regimen.    Lab Results  Component Value Date   HGBA1C 4.6 12/15/2020      Discharge diagnosis     Principal Problem:   Acute upper GI bleeding Active Problems:   Human immunodeficiency virus (HIV) disease ([place])   Mixed hyperlipidemia   TOBACCO ABUSE   Essential hypertension   Coronary artery disease involving native heart without angina pectoris   Chronic combined systolic (congestive) and diastolic (congestive) heart failure (HCC)   Type 2 diabetes mellitus with diabetic polyneuropathy,  with long-term current use of insulin (HCC)   Symptomatic anemia   ESRD on hemodialysis ([place])   Angiodysplasia of intestine with hemorrhage    Discharge instructions    Discharge Instructions    Diet - low sodium heart healthy   Complete by: As directed    Discharge instructions   Complete by: As directed    Follow with Primary MD [name], NP in 7 days   Get CBC, CMP, 2 view Chest X ray -  checked next visit within 1 week by Primary MD    Activity: As tolerated with Full fall precautions use walker/cane & assistance as needed  Disposition Home   Diet: Renal - Low Carb, 1.5lit/day fluid restriction.   Accuchecks 4 times/day, Once in AM empty stomach and then before each meal. Log in all results and show them to your Prim.MD in 3 days. If any glucose reading is under 80 or above 300 call your Prim MD immidiately. Follow Low glucose instructions for glucose under 80 as instructed.  Special Instructions: If you have smoked or chewed Tobacco  in the last 2 yrs please stop smoking, stop any regular Alcohol  and or any Recreational drug use.  On your next visit with your primary care physician please Get Medicines reviewed and adjusted.  Please request your Prim.MD to go over all Hospital Tests and Procedure/Radiological results at the follow up, please get all Hospital records sent to your Prim MD by signing hospital release before you go home.  If you experience worsening of your admission symptoms, develop shortness of breath, life threatening emergency, suicidal or homicidal thoughts you must seek medical attention immediately by calling 911 or calling your MD immediately  if symptoms less severe.  You Must read complete instructions/literature along with all the possible adverse reactions/side effects for all the Medicines you take and that have been prescribed to you. Take any new Medicines after you have completely understood and accpet all the possible adverse  reactions/side effects.   Increase activity slowly   Complete by: As directed    No wound care   Complete by: As directed       Discharge Medications   Allergies as of 02/16/2021      Reactions   Augmentin [amoxicillin-pot Clavulanate] Diarrhea, Other (See Comments)   Severe diarrhea   Mucinex  Fast-max Other (See Comments)   Intense sweating    Amphetamines Other (See Comments)   Unknown reaction      Medication List    STOP taking these medications   aspirin EC 81 MG tablet     TAKE these medications   acetaminophen 650 MG CR tablet Commonly known as: TYLENOL Take 1,300 mg by mouth 3 (three) times daily.   allopurinol 100 MG tablet Commonly known as: ZYLOPRIM TAKE 1 TABLET(100 MG) BY MOUTH DAILY What changed:   how much to take  how to take this  when to take this  additional instructions   Anoro Ellipta 62.5-25 MCG/INH Aepb Generic drug: umeclidinium-vilanterol INHALE 1 PUFF BY MOUTH EVERY DAY What changed: See the new instructions.   B-D UF III MINI PEN NEEDLES 31G X 5 MM Misc Generic drug: Insulin Pen Needle USE FOUR TIMES DAILY   Biktarvy 50-200-25 MG Tabs tablet Generic drug: bictegravir-emtricitabine-tenofovir AF Take 1 tablet by mouth daily.   Calcium Acetate 667 MG Tabs Take 667-1,334 mg by mouth See admin instructions. Take 1,334 mg by mouth three times a day with meals and 667 mg with each snack   carvedilol 3.125 MG tablet Commonly known as: COREG Take 1 tablet (3.125 mg total) by mouth 2 (two) times daily.   colchicine 0.6 MG tablet Take 0.6-1.2 mg by mouth daily as needed (as directed for gout flares).   diclofenac Sodium 1 % Gel Commonly known as: VOLTAREN APPLY 2 GRAMS TO THE AFFECTED AREA THREE TIMES DAILY AS NEEDED FOR PAIN What changed:   how much to take  how to take this  when to take this  reasons to take this  additional instructions   folic acid 1 MG tablet Commonly known as: FOLVITE Take 1 tablet (1 mg total)  by mouth daily.   gabapentin 300 MG capsule Commonly known as: NEURONTIN Take 1 capsule (300 mg total) by mouth daily. Dose change due to renal function What changed: additional instructions   insulin lispro 100 UNIT/ML KwikPen Commonly known as: HUMALOG INJECT 20 UNITS UNDER THE SKIN THREE TIMES DAILY AS NEEDED FOR HIGH BLOOD SUGAR(ABOVE 150)   iron sucrose in sodium chloride 0.9 % 100 mL Iron Sucrose (Venofer)   isosorbide mononitrate 30 MG 24 hr tablet Commonly known as: IMDUR TAKE 1 TABLET BY MOUTH EVERY DAY What changed:   how much to take  how to take this  when to take this  additional instructions   latanoprost 0.005 % ophthalmic solution Commonly known as: XALATAN Place 1 drop into both eyes at bedtime.   lidocaine-prilocaine cream Commonly known as: EMLA Apply 1 application topically every Monday, Wednesday, and Friday with hemodialysis.   MIRCERA IJ Mircera   misoprostol 100 MCG tablet Commonly known as: CYTOTEC Take 1 tablet (100 mcg total) by mouth every 6 (six) hours.   multivitamin Tabs tablet Take 1 tablet by mouth daily.   nitroGLYCERIN 0.3 MG SL tablet Commonly known as: NITROSTAT ONE TABLET UNDER TONGUE AS NEEDED FOR CHEST PAIN What changed: See the new instructions.   OneTouch Delica Plus [id number] Misc Inject 1 Device as directed 3 (three) times daily. Dx: E11.40   OneTouch Ultra test strip Generic drug: glucose blood CHECK BLOOD SUGAR 3 TIMES  DAILY What changed: See the new instructions.   pantoprazole 40 MG tablet Commonly known as: PROTONIX Take 1 tablet (40 mg total) by mouth 2 (two) times daily before a meal.   polyethylene glycol 17 g packet Commonly known  as: MIRALAX / GLYCOLAX Take 17 g by mouth daily as needed for mild constipation. What changed: reasons to take this   SENSIPAR PO Take 1 tablet by mouth 3 (three) times a week. At dialysis   timolol 0.5 % ophthalmic solution Commonly known as: TIMOPTIC Place 1 drop  into both eyes in the morning.        Follow-up Information    [name], NP. Schedule an appointment as soon as possible for a visit in 1 week(s).   Specialty: Geriatric Medicine Contact information: [place]. [place] [address] [phone]        [name], MD .   Specialty: Infectious Diseases Contact information: [place] [place] [address] [phone]        [name], MD. Schedule an appointment as soon as possible for a visit in 1 week(s).   Specialty: Cardiology Contact information: [address] [place] [address] [phone]        Mauri Pole, MD. Schedule an appointment as soon as possible for a visit in 1 week(s).   Specialty: Gastroenterology Contact information: [place] [address] [phone]               Major procedures and Radiology Reports - PLEASE review detailed and final reports thoroughly  -        CT ANGIO ABDOMEN W &/OR WO CONTRAST  Result Date: 02/15/2021 CLINICAL DATA:  70 year old male with melena. Evaluate for upper GI bleed. EXAM: CT ANGIOGRAPHY ABDOMEN TECHNIQUE: Multidetector CT imaging of the abdomen was performed using the standard protocol during bolus administration of intravenous contrast. Multiplanar reconstructed images and MIPs were obtained and reviewed to evaluate the vascular anatomy. CONTRAST:  38mL OMNIPAQUE IOHEXOL 350 MG/ML SOLN COMPARISON:  CT of the chest abdomen pelvis dated 10/16/2020. FINDINGS: Evaluation for GI bleed is limited as precontrast images are not provided. VASCULAR Aorta: Advanced atherosclerotic calcification. No aneurysmal dilatation or dissection. No periaortic fluid collection or edema. Celiac: There is atherosclerotic calcification of the origin of the celiac axis. The celiac axis and its major branches are patent. SMA: Advanced atherosclerotic calcification of the SMA. There is focal  area of high-grade narrowing of the proximal SMA. The SMA remains patent. Renals: Advanced atherosclerotic calcification of the renal arteries. 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 The renal arteries remain patent however with diminished flow. IMA: The IMA is patent. Inflow: Patent without evidence of aneurysm, dissection, vasculitis or significant stenosis. Veins: No obvious venous abnormality within the limitations of this arterial phase study. Review of the MIP images confirms the above findings. NON-VASCULAR Lower chest: The visualized lung bases are clear. There is mild cardiomegaly and coronary vascular calcification. No intra-abdominal free air or free fluid Hepatobiliary: The liver is unremarkable. No intrahepatic biliary ductal dilatation. Cholecystectomy. No retained calcified stone noted in the central CBD. Pancreas: Unremarkable. No pancreatic ductal dilatation or surrounding inflammatory changes. Spleen: Normal in size without focal abnormality. Adrenals/Urinary Tract: The adrenal glands unremarkable. Mild bilateral renal parenchyma atrophy. There is no hydronephrosis on either side. Several subcentimeter bilateral renal hypodense lesions are too small to characterize. Stomach/Bowel: There is no bowel obstruction or active inflammation. The appendix is partially visualized. The visualized appendix is however unremarkable. No extravasated intraluminal contrast noted to suggest active GI bleed. Lymphatic: No adenopathy. Other: None Musculoskeletal: Osteopenia with degenerative changes of the spine. No acute osseous pathology. IMPRESSION: 1. No acute intra-abdominal  pathology. No CT evidence of active GI bleed. 2. Aortic Atherosclerosis (ICD10-I70.0). Electronically Signed   By: Anner Crete M.D.   On: 02/15/2021 21:46   DG CHEST PORT 1 VIEW  Result Date: 01/24/2021 CLINICAL DATA:  Shortness of breath. EXAM: PORTABLE CHEST 1 VIEW COMPARISON:  12/11/2020 FINDINGS: Lungs are adequately  inflated without focal airspace consolidation or effusion. Subtle hazy prominence of the perihilar markings unchanged likely mild chronic vascular congestion. Moderate stable cardiomegaly. Remainder of the exam is unchanged. IMPRESSION: Moderate stable cardiomegaly with suggestion of minimal chronic vascular congestion. Electronically Signed   By: Marin Olp M.D.   On: 01/24/2021 15:01    Micro Results    Recent Results (from the past 240 hour(s))  SARS CORONAVIRUS 2 (TAT 6-24 HRS) Nasopharyngeal Nasopharyngeal Swab     Status: None   Collection Time: 02/12/21 12:18 PM   Specimen: Nasopharyngeal Swab  Result Value Ref Range Status   SARS Coronavirus 2 NEGATIVE NEGATIVE Final    Comment: (NOTE) SARS-CoV-2 target nucleic acids are NOT DETECTED.  The SARS-CoV-2 RNA is generally detectable in upper and lower respiratory specimens during the acute phase of infection. Negative results do not preclude SARS-CoV-2 infection, do not rule out co-infections with other pathogens, and should not be used as the sole basis for treatment or other patient management decisions. Negative results must be combined with clinical observations, patient history, and epidemiological information. The expected result is Negative.  Fact Sheet for Patients: SugarRoll.be  Fact Sheet for Healthcare Providers: https://www.woods-mathews.com/  This test is not yet approved or cleared by the [place] FDA and  has been authorized for detection and/or diagnosis of SARS-CoV-2 by FDA under an Emergency Use Authorization (EUA). This EUA will remain  in effect (meaning this test can be used) for the duration of the COVID-19 declaration under Se ction 564(b)(1) of the Act, 21 U.S.C. section 360bbb-3(b)(1), unless the authorization is terminated or revoked sooner.  Performed at [facility], [place] [address], [place], [place] [address]   MRSA PCR Screening     Status:  None   Collection Time: 02/12/21 10:00 PM   Specimen: Nasal Mucosa; Nasopharyngeal  Result Value Ref Range Status   MRSA by PCR NEGATIVE NEGATIVE Final    Comment:        The GeneXpert MRSA Assay (FDA approved for NASAL specimens only), is one component of a comprehensive MRSA colonization surveillance program. It is not intended to diagnose MRSA infection nor to guide or monitor treatment for MRSA infections. Performed at [facility], [place] [address], [place], [place] [address]     Today   Subjective    Jacob [name] today has no headache,no chest abdominal pain,no new weakness tingling or numbness, feels much better wants to go home today.    Objective   Blood pressure (!) 148/68, pulse 75, temperature 97.7 F (36.5 C), temperature source Axillary, resp. rate 17, height 6\' 2"  (1.88 m), weight 78.2 kg, SpO2 98 %.   Intake/Output Summary (Last 24 hours) at 02/16/2021 0958 Last data filed at 02/16/2021 0855 Gross per 24 hour  Intake 261.67 ml  Output 2000 ml  Net -1738.33 ml    Exam  Awake Alert, No new F.N deficits, Normal affect [place].AT,PERRAL Supple Neck,No JVD, No cervical lymphadenopathy appriciated.  Symmetrical Chest wall movement, Good air movement bilaterally, CTAB RRR,No Gallops,Rubs or new Murmurs, No Parasternal Heave +ve B.Sounds, Abd Soft, Non tender, No organomegaly appriciated, No rebound -guarding or rigidity. No Cyanosis,  Clubbing or edema, No new Rash or bruise   Data Review   CBC w Diff:  Lab Results  Component Value Date   WBC 5.8 02/16/2021   HGB 9.5 (L) 02/16/2021   HGB 8.4 (L) 12/17/2019   HCT 28.6 (L) 02/16/2021   HCT 26.0 (L) 12/17/2019   PLT 74 (L) 02/16/2021   PLT 145 (L) 12/17/2019   LYMPHOPCT 17 02/14/2021   MONOPCT 12 02/14/2021   EOSPCT 2 02/14/2021   BASOPCT 1 02/14/2021    CMP:  Lab Results  Component Value Date   NA 133 (L) 02/16/2021   NA 141 02/05/2020   K 4.1 02/16/2021   CL 96 (L) 02/16/2021   CO2 26  02/16/2021   BUN 47 (H) 02/16/2021   BUN 65 (A) 02/05/2020   CREATININE 5.52 (H) 02/16/2021   CREATININE 5.13 (H) 01/12/2021   GLU 132 02/05/2020   PROT 6.3 (L) 02/12/2021   PROT 7.8 08/07/2015   ALBUMIN 2.9 (L) 02/12/2021   ALBUMIN 3.8 08/07/2015   BILITOT 0.5 02/12/2021   BILITOT 0.5 08/07/2015   ALKPHOS 64 02/12/2021   AST 16 02/12/2021   ALT 12 02/12/2021  .   Total Time in preparing paper work, data evaluation and todays exam - 51 minutes  Lala Lund M.D on 02/16/2021 at 9:58 AM  [facility]

## 2021-02-16 NOTE — Care Management Important Message (Signed)
Important Message  Patient Details  Name: DEQUAVIUS KUHNER MRN: [id number] Date of Birth: 1951-01-06   Medicare Important Message Given:  Yes     Amaliya Whitelaw P [place] 02/16/2021, 2:21 PM

## 2021-02-17 ENCOUNTER — Telehealth: Payer: Self-pay | Admitting: *Deleted

## 2021-02-17 DIAGNOSIS — D631 Anemia in chronic kidney disease: Secondary | ICD-10-CM | POA: Diagnosis not present

## 2021-02-17 DIAGNOSIS — N186 End stage renal disease: Secondary | ICD-10-CM | POA: Diagnosis not present

## 2021-02-17 DIAGNOSIS — N2581 Secondary hyperparathyroidism of renal origin: Secondary | ICD-10-CM | POA: Diagnosis not present

## 2021-02-17 DIAGNOSIS — Z992 Dependence on renal dialysis: Secondary | ICD-10-CM | POA: Diagnosis not present

## 2021-02-17 NOTE — Telephone Encounter (Signed)
Transition Care Management Follow-Up Telephone Call   Date discharged and where:02/16/2021 [facility]  How have you been since you were released from the hospital? Better, but busy with appointments  Any patient concerns? No  Items Reviewed:   Meds: Yes  Allergies:Yes  Dietary Changes Reviewed:Yes  Functional Questionnaire:  Independent-I Dependent-D  ADLs:I   Dressing- I    Eating- I   Maintaining continence-I   Transferring- I   Transportation-D   Meal Prep-I   Managing Meds- I  Confirmed importance and Date/Time of follow-up visits scheduled:Appointment 02/25/2021 with Amy   Confirmed with patient if condition worsens to call PCP or go to the Emergency Dept. Patient was given office number and encouraged to call back with questions or concerns: Yes

## 2021-02-17 NOTE — Telephone Encounter (Signed)
I have made the 1st attempt to contact the patient or family member in charge, in order to follow up from recently being discharged from the hospital. I left a message on voicemail but I will make another attempt at a different time.  

## 2021-02-19 DIAGNOSIS — Z992 Dependence on renal dialysis: Secondary | ICD-10-CM | POA: Diagnosis not present

## 2021-02-19 DIAGNOSIS — D631 Anemia in chronic kidney disease: Secondary | ICD-10-CM | POA: Diagnosis not present

## 2021-02-19 DIAGNOSIS — N186 End stage renal disease: Secondary | ICD-10-CM | POA: Diagnosis not present

## 2021-02-19 DIAGNOSIS — N2581 Secondary hyperparathyroidism of renal origin: Secondary | ICD-10-CM | POA: Diagnosis not present

## 2021-02-20 DIAGNOSIS — N186 End stage renal disease: Secondary | ICD-10-CM | POA: Diagnosis not present

## 2021-02-20 DIAGNOSIS — Z992 Dependence on renal dialysis: Secondary | ICD-10-CM | POA: Diagnosis not present

## 2021-02-20 DIAGNOSIS — I129 Hypertensive chronic kidney disease with stage 1 through stage 4 chronic kidney disease, or unspecified chronic kidney disease: Secondary | ICD-10-CM | POA: Diagnosis not present

## 2021-02-22 ENCOUNTER — Telehealth: Payer: Self-pay

## 2021-02-22 DIAGNOSIS — N2581 Secondary hyperparathyroidism of renal origin: Secondary | ICD-10-CM | POA: Diagnosis not present

## 2021-02-22 DIAGNOSIS — N186 End stage renal disease: Secondary | ICD-10-CM | POA: Diagnosis not present

## 2021-02-22 DIAGNOSIS — Z992 Dependence on renal dialysis: Secondary | ICD-10-CM | POA: Diagnosis not present

## 2021-02-22 NOTE — Telephone Encounter (Signed)
-----   Message from Yevette Edwards, RN sent at 02/09/2021  4:35 PM EDT ----- Regarding: Hold Hold Pantoprazole, urea breath test due on or after 5/16

## 2021-02-22 NOTE — Telephone Encounter (Signed)
Spoke with patient to remind him to hold Pantoprazole starting tomorrow. Patient is aware that I am mailing a lab order for his Urea breath test and he will need to take the order with him to [facility] at his convenience after 03/08/21. I have mailed a letter with this information as well - pt confirmed address. Advised patient to call back if he has not received the letter and order within 2 weeks. Answered all of patient's questions, he verbalized understanding and had no concerns at the end of the call.

## 2021-02-23 ENCOUNTER — Ambulatory Visit: Payer: Medicare Other | Admitting: Podiatry

## 2021-02-23 ENCOUNTER — Ambulatory Visit (INDEPENDENT_AMBULATORY_CARE_PROVIDER_SITE_OTHER): Payer: Medicare Other | Admitting: Podiatry

## 2021-02-23 ENCOUNTER — Other Ambulatory Visit: Payer: Self-pay

## 2021-02-23 DIAGNOSIS — E1140 Type 2 diabetes mellitus with diabetic neuropathy, unspecified: Secondary | ICD-10-CM | POA: Diagnosis not present

## 2021-02-23 DIAGNOSIS — Z794 Long term (current) use of insulin: Secondary | ICD-10-CM

## 2021-02-23 DIAGNOSIS — E0822 Diabetes mellitus due to underlying condition with diabetic chronic kidney disease: Secondary | ICD-10-CM

## 2021-02-23 DIAGNOSIS — M2042 Other hammer toe(s) (acquired), left foot: Secondary | ICD-10-CM | POA: Diagnosis not present

## 2021-02-23 DIAGNOSIS — L84 Corns and callosities: Secondary | ICD-10-CM | POA: Diagnosis not present

## 2021-02-23 DIAGNOSIS — M2041 Other hammer toe(s) (acquired), right foot: Secondary | ICD-10-CM | POA: Diagnosis not present

## 2021-02-23 DIAGNOSIS — Z992 Dependence on renal dialysis: Secondary | ICD-10-CM

## 2021-02-23 DIAGNOSIS — N186 End stage renal disease: Secondary | ICD-10-CM

## 2021-02-23 NOTE — Progress Notes (Signed)
The patient presented to the office to day to pick up diabetic shoes and 3 pair of diabetic custom inserts.  1 pair of inserts were put in the shoes and the shoes were fitted to the patient. The patient states they are comfortable and free of defect. He was satisfied with the fit of the shoe. Instructions for break in and wear were dispensed. The patient signed the delivery documentation and break in instruction form.  If any concerns or questions arise, he is instructed to call

## 2021-02-24 DIAGNOSIS — N186 End stage renal disease: Secondary | ICD-10-CM | POA: Diagnosis not present

## 2021-02-24 DIAGNOSIS — N2581 Secondary hyperparathyroidism of renal origin: Secondary | ICD-10-CM | POA: Diagnosis not present

## 2021-02-24 DIAGNOSIS — Z992 Dependence on renal dialysis: Secondary | ICD-10-CM | POA: Diagnosis not present

## 2021-02-25 ENCOUNTER — Encounter: Payer: Self-pay | Admitting: Orthopedic Surgery

## 2021-02-25 ENCOUNTER — Ambulatory Visit (INDEPENDENT_AMBULATORY_CARE_PROVIDER_SITE_OTHER): Payer: Medicare Other | Admitting: Orthopedic Surgery

## 2021-02-25 ENCOUNTER — Other Ambulatory Visit: Payer: Self-pay

## 2021-02-25 ENCOUNTER — Other Ambulatory Visit: Payer: Self-pay | Admitting: Orthopedic Surgery

## 2021-02-25 VITALS — BP 136/64 | HR 82 | Temp 98.6°F | Ht 74.0 in | Wt 170.4 lb

## 2021-02-25 DIAGNOSIS — M25551 Pain in right hip: Secondary | ICD-10-CM

## 2021-02-25 DIAGNOSIS — K219 Gastro-esophageal reflux disease without esophagitis: Secondary | ICD-10-CM | POA: Diagnosis not present

## 2021-02-25 DIAGNOSIS — Z7689 Persons encountering health services in other specified circumstances: Secondary | ICD-10-CM | POA: Diagnosis not present

## 2021-02-25 DIAGNOSIS — E1140 Type 2 diabetes mellitus with diabetic neuropathy, unspecified: Secondary | ICD-10-CM | POA: Diagnosis not present

## 2021-02-25 DIAGNOSIS — D62 Acute posthemorrhagic anemia: Secondary | ICD-10-CM | POA: Diagnosis not present

## 2021-02-25 DIAGNOSIS — E1142 Type 2 diabetes mellitus with diabetic polyneuropathy: Secondary | ICD-10-CM | POA: Diagnosis not present

## 2021-02-25 DIAGNOSIS — Z992 Dependence on renal dialysis: Secondary | ICD-10-CM | POA: Diagnosis not present

## 2021-02-25 DIAGNOSIS — N186 End stage renal disease: Secondary | ICD-10-CM | POA: Diagnosis not present

## 2021-02-25 DIAGNOSIS — Z794 Long term (current) use of insulin: Secondary | ICD-10-CM | POA: Diagnosis not present

## 2021-02-25 DIAGNOSIS — I5042 Chronic combined systolic (congestive) and diastolic (congestive) heart failure: Secondary | ICD-10-CM

## 2021-02-25 DIAGNOSIS — I25118 Atherosclerotic heart disease of native coronary artery with other forms of angina pectoris: Secondary | ICD-10-CM

## 2021-02-25 DIAGNOSIS — I10 Essential (primary) hypertension: Secondary | ICD-10-CM

## 2021-02-25 DIAGNOSIS — F17200 Nicotine dependence, unspecified, uncomplicated: Secondary | ICD-10-CM

## 2021-02-25 DIAGNOSIS — D638 Anemia in other chronic diseases classified elsewhere: Secondary | ICD-10-CM

## 2021-02-25 NOTE — Patient Instructions (Signed)
Gastrointestinal Bleeding Gastrointestinal (GI) bleeding is bleeding somewhere along the path that food travels through the body (digestive tract). This path is anywhere between the mouth and the opening of the butt (anus). You may have blood in your poop (stool) or have black poop. If you throw up (vomit), there may be blood in it. This condition can be mild, serious, or even life-threatening. If you have a lot of bleeding, you may need to stay in the hospital. What are the causes? This condition may be caused by:  Irritation and swelling of the esophagus (esophagitis). The esophagus is part of the body that moves food from your mouth to your stomach.  Swollen veins in the butt (hemorrhoids).  Areas of painful tearing in the opening of the butt (anal fissures). These are often caused by passing hard poop.  Pouches that form on the colon over time (diverticulosis).  Irritation and swelling (diverticulitis) in areas where pouches have formed on the colon.  Growths (polyps) or cancer. Colon cancer often starts out as growths that are not cancer.  Irritation of the stomach lining (gastritis).  Sores (ulcers) in the stomach. What increases the risk? You are more likely to develop this condition if you:  Have a certain type of infection in your stomach (Helicobacter pylori infection).  Take certain medicines.  Smoke.  Drink alcohol. What are the signs or symptoms? Common symptoms of this condition include:  Throwing up (vomiting) material that has bright red blood in it. It may look like coffee grounds.  Changes in your poop. The poop may: ? Have red blood in it. ? Be black, look like tar, and smell stronger than normal. ? Be red.  Pain or cramping in the belly (abdomen). How is this treated? Treatment for this condition depends on the cause of the bleeding. For example:  Sometimes, the bleeding can be stopped during a procedure that is done to find the problem (endoscopy or  colonoscopy).  Medicines can be used to: ? Help control irritation, swelling, or infection. ? Reduce acid in your stomach.  Certain problems can be treated with: ? Creams. ? Medicines that are put in the butt (suppositories). ? Warm baths.  Surgery is sometimes needed.  If you lose a lot of blood, you may need a blood transfusion. If bleeding is mild, you may be allowed to go home. If there is a lot of bleeding, you will need to stay in the hospital. Follow these instructions at home:  Take over-the-counter and prescription medicines only as told by your doctor.  Eat foods that have a lot of fiber in them. These foods include beans, whole grains, and fresh fruits and vegetables. You can also try eating 1-3 prunes each day.  Drink enough fluid to keep your pee (urine) pale yellow.  Keep all follow-up visits as told by your doctor. This is important.   Contact a doctor if:  Your symptoms do not get better. Get help right away if:  Your bleeding does not stop.  You feel dizzy or you pass out (faint).  You feel weak.  You have very bad cramps in your back or belly.  You pass large clumps of blood (clots) in your poop.  Your symptoms are getting worse.  You have chest pain or fast heartbeats. Summary  GI bleeding is bleeding somewhere along the path that food travels through the body (digestive tract).  This bleeding can be caused by many things. Treatment depends on the cause of the bleeding.    Take medicines only as told by your doctor.  Keep all follow-up visits as told by your doctor. This is important. This information is not intended to replace advice given to you by your health care provider. Make sure you discuss any questions you have with your health care provider. Document Revised: 05/23/2018 Document Reviewed: 05/23/2018 Elsevier Patient Education  2021 [employer].  

## 2021-02-25 NOTE — Progress Notes (Signed)
Careteam: Patient Care Team: [name], NP as PCP - General (Geriatric Medicine) [name], MD as PCP - Infectious Diseases (Infectious Diseases) [name], MD as PCP - Cardiology (Cardiology) [name], MD as Consulting Physician (Ophthalmology) [name], MD as Consulting Physician (Cardiology) [name], MD as Consulting Physician (Infectious Diseases) Gardiner Barefoot, DPM as Consulting Physician (Podiatry) [name], Nelda Bucks, NP as Nurse Practitioner (Family Medicine) [place], [name]  Seen by: Windell Moulding, AGNP-C  PLACE OF SERVICE:  [place]  Advanced Directive information    Allergies  Allergen Reactions  . Augmentin [Amoxicillin-Pot Clavulanate] Diarrhea and Other (See Comments)    Severe diarrhea  . Mucinex Fast-Max Other (See Comments)    Intense sweating   . Amphetamines Other (See Comments)    Unknown reaction    Chief Complaint  Patient presents with  . [facility] discharge follow up. Patient has been doing well, has ortho referral for pain in foot on 03/09/20.     HPI: Patient is a 70 y.o. male seen for transition of care due to recent hospitalization at [facility] 04/22-04/26 for acute upper GI bleed.   Hospitalized three times since March 2022 for GI bleed. 04/03-5 EGD showed multiple AVMs. He was given APC and PPI, advised to hold aspirin. He restarted aspiring 04/10 as instructed. 04/22 he presented to the ED due to increased bloating and black stools. 04/23 EGD showed active bleeding in duodenal bulb. Received 2 units PRBC. Discharged with misoprostol and PPI. Advised to hold aspirin. Today, he denies any signs of bleeding. Admits to past alcohol abuse as cause for GI bleeds. He was tempted to drink a few days ago, but refrained from drinking. He reports he has not had a alcoholic beverage since April 3rd. Continues to take misoprostol and protonix as prescribed. Denies abdominal pain, bloating,  heartburn, black stools or coffee ground emesis.   Scheduled to see cardiology 05/10 and GI 05/12 next week.   Right lower back and hip pain. Scheduled to see [facility] this month. Taking Neurontin daily for pain. He denies using NSAIDS.   Went to dialysis yesterday. They did not draw labs.   Continues to smoke. He does not have a plan to quit.   Review of Systems:  Review of Systems  Constitutional: Negative for chills, fever and malaise/fatigue.  HENT: Negative.   Respiratory: Negative for cough, shortness of breath and wheezing.        Tobacco use  Cardiovascular: Negative for chest pain and leg swelling.       Left upper arm fistula  Gastrointestinal: Negative for abdominal pain, blood in stool, constipation, diarrhea, heartburn, nausea and vomiting.       History of GI bleed x 3  Genitourinary: Negative for dysuria and hematuria.  Musculoskeletal: Positive for back pain, falls and myalgias.       Right hip pain  Skin: Negative.   Neurological: Negative for dizziness, weakness and headaches.  Psychiatric/Behavioral: Negative for depression. The patient is not nervous/anxious.     Past Medical History:  Diagnosis Date  . Acute respiratory failure ([place]) 03/01/2018  . Arthritis    "all over; mostly knees and back" (02/28/2018)  . Chronic lower back pain    stenosis  . Community acquired pneumonia 09/06/2013  . COPD (chronic obstructive pulmonary disease) ([place])   . Coronary atherosclerosis of native coronary artery    a. 02/2003 s/p CABG x 2 (VG->RI, VG->RPDA; b. 11/2019 PCI:  LM nl, LAD 90d, D3 50, RI 100, LCX 100p, OM3 100 - fills via L->L collats from D2/dLAD, RCA 100p, VG->RPDA ok, VG->RI 95 (3.5x48 Synergy XD DES).  . Drug abuse ([place])    hx; tested for cocaine as recently as 2/08. says he is not using drugs now - avoided defib. for this reason   . ESRD (end stage renal disease) ([name])    Hemo M-W-F- Richarda Blade  . Fall at home 10/2020  . GERD (gastroesophageal reflux disease)     takes OTC meds as needed  . GI bleeding    a. 11/2019 EGD: angiodysplastic lesions w/ bleeding s/p argon plasma/clipping/epi inj.  . Glaucoma    uses eye drops daily  . Hepatitis B 1968   "tx'd w/isolation; caught it from toilet stools in gym"  . HFrEF (heart failure with reduced ejection fraction) ([place])    a. 01/2019 Echo: EF 40-45%, diffuse HK, mild basal septal hypertrophy.  . History of blood transfusion 03/01/2019  . History of colon polyps    benign  . History of gout    takes Allopurinol daily as well as Colchicine-if needed (02/28/2018)  . History of kidney stones   . HTN (hypertension)    takes Coreg,Imdur.and Apresoline daily  . Human immunodeficiency virus (HIV) disease ([place]) dx'd 60   on [place] as of 12/2020.    [name] Hyperlipidemia    takes Atorvastatin daily  . Ischemic cardiomyopathy    a. 01/2019 Echo: EF 40-45%, diffuse HK, mild basal septal hypertrophy. Diast dysfxn. Nl RV size/fxn. Sev dil LA. Triv MR/TR/PR.  [name] Muscle spasm    takes Zanaflex as needed  . Myocardial infarction ([place]) ~ 2004/2005  . Nocturia   . Peripheral neuropathy    takes gabapentin daily  . Pneumonia    "at least twice" (02/28/2018)  . Syphilis, unspecified   . Type II diabetes mellitus ([place]) 2004   Lantus daily.Average fasting blood sugar 125-199  . Wears glasses   . Wears partial dentures    Past Surgical History:  Procedure Laterality Date  . AV FISTULA PLACEMENT Left 08/02/2018   Procedure: ARTERIOVENOUS (AV) FISTULA CREATION  left arm radiocephlic;  Surgeon: Marty Heck, MD;  Location: [place];  Service: Vascular;  Laterality: Left;  . AV FISTULA PLACEMENT Left 08/01/2019   Procedure: LEFT BRACHIOCEPHALIC ARTERIOVENOUS (AV) FISTULA CREATION;  Surgeon: [name], MD;  Location: [place];  Service: Vascular;  Laterality: Left;  . BASCILIC VEIN TRANSPOSITION Left 10/03/2019   Procedure: BASILIC VEIN TRANSPOSITION LEFT SECOND STAGE;  Surgeon: [name], MD;  Location: [place];   Service: Vascular;  Laterality: Left;  . BIOPSY  01/25/2021   Procedure: BIOPSY;  Surgeon: Doran Stabler, MD;  Location: [facility] ENDOSCOPY;  Service: Gastroenterology;;  . CARDIAC CATHETERIZATION  10/2002; 12/19/2004   [name] 03/08/2011  . COLONOSCOPY  2013   Dr.John Henrene Pastor   . CORONARY ARTERY BYPASS GRAFT  02/24/2003   CABG X2/notes 03/08/2011  . CORONARY STENT INTERVENTION N/A 12/19/2019   Procedure: CORONARY STENT INTERVENTION;  Surgeon: Jettie Booze, MD;  Location: [place] CV LAB;  Service: Cardiovascular;  Laterality: N/A;  . ENTEROSCOPY N/A 01/25/2021   Procedure: ENTEROSCOPY;  Surgeon: Doran Stabler, MD;  Location: [place];  Service: Gastroenterology;  Laterality: N/A;  . ENTEROSCOPY N/A 02/13/2021   Procedure: ENTEROSCOPY;  Surgeon: Jackquline Denmark, MD;  Location: [facility] ENDOSCOPY;  Service: Endoscopy;  Laterality: N/A;  . ESOPHAGOGASTRODUODENOSCOPY (EGD) WITH PROPOFOL N/A 02/08/2019  Procedure: ESOPHAGOGASTRODUODENOSCOPY (EGD) WITH PROPOFOL;  Surgeon: Milus Banister, MD;  Location: [facility] ENDOSCOPY;  Service: Gastroenterology;  Laterality: N/A;  . ESOPHAGOGASTRODUODENOSCOPY (EGD) WITH PROPOFOL N/A 12/22/2019   Procedure: ESOPHAGOGASTRODUODENOSCOPY (EGD) WITH PROPOFOL;  Surgeon: Lavena Bullion, DO;  Location: [place];  Service: Gastroenterology;  Laterality: N/A;  . ESOPHAGOGASTRODUODENOSCOPY (EGD) WITH PROPOFOL N/A 10/19/2020   Procedure: ESOPHAGOGASTRODUODENOSCOPY (EGD) WITH PROPOFOL;  Surgeon: Jackquline Denmark, MD;  Location: [facility] ENDOSCOPY;  Service: Endoscopy;  Laterality: N/A;  . ESOPHAGOGASTRODUODENOSCOPY (EGD) WITH PROPOFOL N/A 12/22/2020   Procedure: ESOPHAGOGASTRODUODENOSCOPY (EGD) WITH PROPOFOL;  Surgeon: Gatha Mayer, MD;  Location: [place];  Service: Endoscopy;  Laterality: N/A;  . ESOPHAGOGASTRODUODENOSCOPY (EGD) WITH PROPOFOL N/A 01/09/2021   Procedure: ESOPHAGOGASTRODUODENOSCOPY (EGD) WITH PROPOFOL;  Surgeon: Irene Shipper, MD;  Location: [facility] ENDOSCOPY;   Service: Endoscopy;  Laterality: N/A;  . HEMOSTASIS CLIP PLACEMENT  12/22/2019   Procedure: HEMOSTASIS CLIP PLACEMENT;  Surgeon: Lavena Bullion, DO;  Location: [place] ENDOSCOPY;  Service: Gastroenterology;;  . HEMOSTASIS CLIP PLACEMENT  12/22/2020   Procedure: HEMOSTASIS CLIP PLACEMENT;  Surgeon: Gatha Mayer, MD;  Location: [facility] ENDOSCOPY;  Service: Endoscopy;;  . HEMOSTASIS CONTROL  12/22/2020   Procedure: HEMOSTASIS CONTROL/hemospray;  Surgeon: Gatha Mayer, MD;  Location: [place];  Service: Endoscopy;;  . HOT HEMOSTASIS N/A 02/08/2019   Procedure: HOT HEMOSTASIS (ARGON PLASMA COAGULATION/BICAP);  Surgeon: Milus Banister, MD;  Location: [facility] ENDOSCOPY;  Service: Gastroenterology;  Laterality: N/A;  . HOT HEMOSTASIS N/A 12/22/2019   Procedure: HOT HEMOSTASIS (ARGON PLASMA COAGULATION/BICAP);  Surgeon: Lavena Bullion, DO;  Location: [facility] ENDOSCOPY;  Service: Gastroenterology;  Laterality: N/A;  . HOT HEMOSTASIS N/A 10/19/2020   Procedure: HOT HEMOSTASIS (ARGON PLASMA COAGULATION/BICAP);  Surgeon: Jackquline Denmark, MD;  Location: [facility] ENDOSCOPY;  Service: Endoscopy;  Laterality: N/A;  . HOT HEMOSTASIS N/A 12/22/2020   Procedure: HOT HEMOSTASIS (ARGON PLASMA COAGULATION/BICAP);  Surgeon: Gatha Mayer, MD;  Location: [facility] ENDOSCOPY;  Service: Endoscopy;  Laterality: N/A;  . HOT HEMOSTASIS N/A 01/09/2021   Procedure: HOT HEMOSTASIS (ARGON PLASMA COAGULATION/BICAP);  Surgeon: Irene Shipper, MD;  Location: [facility] ENDOSCOPY;  Service: Endoscopy;  Laterality: N/A;  . HOT HEMOSTASIS N/A 01/25/2021   Procedure: HOT HEMOSTASIS (ARGON PLASMA COAGULATION/BICAP);  Surgeon: Doran Stabler, MD;  Location: [place];  Service: Gastroenterology;  Laterality: N/A;  . HOT HEMOSTASIS N/A 02/13/2021   Procedure: HOT HEMOSTASIS (ARGON PLASMA COAGULATION/BICAP);  Surgeon: Jackquline Denmark, MD;  Location: [facility] ENDOSCOPY;  Service: Endoscopy;  Laterality: N/A;  . INTERTROCHANTERIC HIP FRACTURE SURGERY Left 11/2006   [name]  03/08/2011  . INTRAVASCULAR ULTRASOUND/IVUS N/A 12/19/2019   Procedure: Intravascular Ultrasound/IVUS;  Surgeon: Jettie Booze, MD;  Location: [place] CV LAB;  Service: Cardiovascular;  Laterality: N/A;  . IR FLUORO GUIDE CV LINE RIGHT  07/24/2019  . IR FLUORO GUIDE CV LINE RIGHT  07/30/2019  . IR US GUIDE VASC ACCESS RIGHT  07/24/2019  . IR US GUIDE VASC ACCESS RIGHT  07/30/2019  . LAPAROSCOPIC CHOLECYSTECTOMY  05/2006  . LIGATION OF COMPETING BRANCHES OF ARTERIOVENOUS FISTULA Left 11/05/2018   Procedure: LIGATION OF COMPETING BRANCHES OF ARTERIOVENOUS FISTULA  LEFT  ARM;  Surgeon: Marty Heck, MD;  Location: [place];  Service: Vascular;  Laterality: Left;  . LUMBAR LAMINECTOMY/DECOMPRESSION MICRODISCECTOMY N/A 02/29/2016   Procedure: Left L4-5 Lateral Recess Decompression, Removal Extradural Intraspinal Facet Cyst;  Surgeon: Marybelle Killings, MD;  Location: [place];  Service: Orthopedics;  Laterality: N/A;  . MULTIPLE TOOTH EXTRACTIONS    .  ORIF MANDIBULAR FRACTURE Left 08/13/2004   ORIF of left body fracture mandible with KLS Martin 2.3-mm six hole/notes 03/08/2011  . RIGHT/LEFT HEART CATH AND CORONARY/GRAFT ANGIOGRAPHY N/A 12/19/2019   Procedure: RIGHT/LEFT HEART CATH AND CORONARY/GRAFT ANGIOGRAPHY;  Surgeon: Jettie Booze, MD;  Location: [place] CV LAB;  Service: Cardiovascular;  Laterality: N/A;  . SCLEROTHERAPY  12/22/2019   Procedure: SCLEROTHERAPY;  Surgeon: Lavena Bullion, DO;  Location: [facility] ENDOSCOPY;  Service: Gastroenterology;;  . Clide Deutscher  02/13/2021   Procedure: SCLEROTHERAPY;  Surgeon: Jackquline Denmark, MD;  Location: [facility] ENDOSCOPY;  Service: Endoscopy;;   Social History:   reports that he has been smoking cigarettes. He has a 21.50 pack-year smoking history. He has never used smokeless tobacco. He reports previous alcohol use of about 12.0 standard drinks of alcohol per week. He reports previous drug use. Drug: Cocaine.  Family History  Problem Relation Age of  Onset  . Heart failure Father   . Hypertension Father   . Diabetes Brother   . Heart attack Brother   . Alzheimer's disease Mother   . Stroke Sister   . Diabetes Sister   . Alzheimer's disease Sister   . Hypertension Brother   . Diabetes Brother   . Drug abuse Brother   . Colon cancer Neg Hx     Medications: Patient's Medications  New Prescriptions   No medications on file  Previous Medications   ACETAMINOPHEN (TYLENOL) 650 MG CR TABLET    Take 1,300 mg by mouth 3 (three) times daily.   ALLOPURINOL (ZYLOPRIM) 100 MG TABLET    TAKE 1 TABLET(100 MG) BY MOUTH DAILY   ANORO ELLIPTA 62.5-25 MCG/INH AEPB    INHALE 1 PUFF BY MOUTH EVERY DAY   B-D UF III MINI PEN NEEDLES 31G X 5 MM MISC    USE FOUR TIMES DAILY   BICTEGRAVIR-EMTRICITABINE-TENOFOVIR AF (BIKTARVY) 50-200-25 MG TABS TABLET    Take 1 tablet by mouth daily.   CALCIUM ACETATE 667 MG TABS    Take 667-1,334 mg by mouth See admin instructions. Take 1,334 mg by mouth three times a day with meals and 667 mg with each snack   CARVEDILOL (COREG) 3.125 MG TABLET    Take 1 tablet (3.125 mg total) by mouth 2 (two) times daily.   CINACALCET HCL (SENSIPAR PO)    Take 1 tablet by mouth 3 (three) times a week. At dialysis   COLCHICINE 0.6 MG TABLET    Take 0.6-1.2 mg by mouth daily as needed (as directed for gout flares).   DICLOFENAC SODIUM (VOLTAREN) 1 % GEL    APPLY 2 GRAMS TO THE AFFECTED AREA THREE TIMES DAILY AS NEEDED FOR PAIN   FOLIC ACID (FOLVITE) 1 MG TABLET    Take 1 tablet (1 mg total) by mouth daily.   GABAPENTIN (NEURONTIN) 300 MG CAPSULE    Take 1 capsule (300 mg total) by mouth daily. Dose change due to renal function   INSULIN LISPRO (HUMALOG) 100 UNIT/ML KWIKPEN    INJECT 20 UNITS UNDER THE SKIN THREE TIMES DAILY AS NEEDED FOR HIGH BLOOD SUGAR(ABOVE 150)   IRON SUCROSE IN SODIUM CHLORIDE 0.9 % 100 ML    Iron Sucrose (Venofer)   ISOSORBIDE MONONITRATE (IMDUR) 30 MG 24 HR TABLET    TAKE 1 TABLET BY MOUTH EVERY DAY   LANCETS  (ONETOUCH DELICA PLUS [id number]) MISC    Inject 1 Device as directed 3 (three) times daily. Dx: E11.40   LATANOPROST (XALATAN) 0.005 % OPHTHALMIC SOLUTION  Place 1 drop into both eyes at bedtime.   LIDOCAINE-PRILOCAINE (EMLA) CREAM    Apply 1 application topically every Monday, Wednesday, and Friday with hemodialysis.   METHOXY PEG-EPOETIN BETA (MIRCERA IJ)    Mircera   MISOPROSTOL (CYTOTEC) 100 MCG TABLET    Take 1 tablet (100 mcg total) by mouth every 6 (six) hours.   MULTIVITAMIN (RENA-VIT) TABS TABLET    Take 1 tablet by mouth daily.   NITROGLYCERIN (NITROSTAT) 0.3 MG SL TABLET    ONE TABLET UNDER TONGUE AS NEEDED FOR CHEST PAIN   ONETOUCH ULTRA TEST STRIP    CHECK BLOOD SUGAR 3 TIMES  DAILY   PANTOPRAZOLE (PROTONIX) 40 MG TABLET    Take 1 tablet (40 mg total) by mouth 2 (two) times daily before a meal.   POLYETHYLENE GLYCOL (MIRALAX / GLYCOLAX) 17 G PACKET    Take 17 g by mouth daily as needed for mild constipation.   TIMOLOL (TIMOPTIC) 0.5 % OPHTHALMIC SOLUTION    Place 1 drop into both eyes in the morning.  Modified Medications   No medications on file  Discontinued Medications   No medications on file    Physical Exam:  There were no vitals filed for this visit. There is no height or weight on file to calculate BMI. Wt Readings from Last 3 Encounters:  02/15/21 172 lb 6.4 oz (78.2 kg)  02/04/21 172 lb 3.2 oz (78.1 kg)  01/25/21 164 lb 10.9 oz (74.7 kg)    Physical Exam Vitals reviewed.  Constitutional:      General: He is not in acute distress. HENT:     Head: Normocephalic.  Eyes:     General:        Right eye: No discharge.        Left eye: No discharge.  Cardiovascular:     Rate and Rhythm: Normal rate and regular rhythm.     Pulses: Normal pulses.     Heart sounds: No murmur heard.     Comments: Left arm fistula + bruit Pulmonary:     Effort: Pulmonary effort is normal. No respiratory distress.     Breath sounds: Normal breath sounds. No wheezing.   Abdominal:     General: Bowel sounds are normal. There is no distension.     Palpations: Abdomen is soft.     Tenderness: There is no abdominal tenderness.  Musculoskeletal:     Cervical back: Normal range of motion.     Right lower leg: No edema.     Left lower leg: No edema.  Lymphadenopathy:     Cervical: No cervical adenopathy.  Skin:    General: Skin is warm and dry.     Capillary Refill: Capillary refill takes less than 2 seconds.  Neurological:     General: No focal deficit present.     Mental Status: He is alert and oriented to person, place, and time.     Gait: Gait abnormal.     Comments: cane  Psychiatric:        Mood and Affect: Mood normal.        Behavior: Behavior normal.     Labs reviewed: Basic Metabolic Panel: Recent Labs    10/17/20 0139 10/17/20 1401 12/15/20 1105 12/21/20 0834 12/23/20 0125 12/24/20 0240 01/08/21 1106 01/08/21 1335 01/09/21 0257 01/12/21 1139 01/25/21 0427 01/25/21 1243 01/26/21 0602 02/12/21 0855 02/14/21 0613 02/15/21 0651 02/16/21 0630  NA  --    < > 138   < > 143   < >  --    < >  136   < > 137   < > 137   < > 137 135 133*  K  --    < > 4.0   < > 3.6   < >  --    < > 3.6   < > 4.5   < > 3.7   < > 4.9 5.2* 4.1  CL  --    < > 95*   < > 106   < >  --    < > 99   < > 94*   < > 97*   < > 101 100 96*  CO2  --    < > 29   < > 23   < >  --    < > 30   < > 25  --  26   < > 25 22 26   GLUCOSE  --    < > 112*   < > 110*   < >  --    < > 121*   < > 109*   < > 117*   < > 139* 122* 110*  BUN  --    < > 51*   < > 107*   < >  --    < > 31*   < > 83*   < > 37*   < > 117* 111* 47*  CREATININE 8.61*   < > 5.77*   < > 5.75*   < >  --    < > 3.75*   < > 8.56*   < > 5.90*   < > 7.07* 7.58* 5.52*  CALCIUM  --    < > 8.7   < > 8.4*   < >  --    < > 8.2*   < > 9.1  --  9.3   < > 8.8* 8.9 8.0*  MG  --    < >  --   --  1.9  --  2.2  --   --   --   --   --   --   --  1.7  --   --   PHOS  --    < >  --   --   --   --   --    < > 4.9*  --  10.9*  --   6.9*  --   --   --   --   TSH 0.748  --  0.49  --   --   --   --   --   --   --   --   --   --   --   --   --   --    < > = values in this interval not displayed.   Liver Function Tests: Recent Labs    12/21/20 0834 01/07/21 1044 01/08/21 1335 01/25/21 0427 01/26/21 0602 02/12/21 0855  AST 16 21  --   --   --  16  ALT 9 13  --   --   --  12  ALKPHOS 79 86  --   --   --  64  BILITOT 0.8 0.6  --   --   --  0.5  PROT 6.8 6.5  --   --   --  6.3*  ALBUMIN 2.8* 3.1*   < > 3.2* 3.2* 2.9*   < > = values in this interval not displayed.   Recent Labs  10/16/20 1807 12/21/20 0834  LIPASE 70* 73*   No results for input(s): AMMONIA in the last 8760 hours. CBC: Recent Labs    01/24/21 1013 01/25/21 0427 02/12/21 0855 02/12/21 2054 02/14/21 3785 02/14/21 1836 02/15/21 0651 02/15/21 1320 02/15/21 2038 02/16/21 0730  WBC 4.9   < > 5.9   < > 8.3   < > 6.2  --  5.5 5.8  NEUTROABS 3.1  --  4.0  --  5.8  --   --   --   --   --   HGB 6.7*   < > 5.9*   < > 8.0*   < > 7.4* 10.2* 9.1* 9.5*  HCT 22.1*   < > 20.0*   < > 23.9*   < > 23.3* 29.7* 27.2* 28.6*  MCV 92.1   < > 98.0   < > 90.2   < > 94.7  --  91.6 92.3  PLT 178   < > 190   < > 79*   < > 96*  --  68* 74*   < > = values in this interval not displayed.   Lipid Panel: Recent Labs    12/15/20 1105  CHOL 143  HDL 31*  LDLCALC 79  TRIG 248*  CHOLHDL 4.6   TSH: Recent Labs    10/17/20 0139 12/15/20 1105  TSH 0.748 0.49   A1C: Lab Results  Component Value Date   HGBA1C 4.6 12/15/2020     Assessment/Plan 1. Acute blood loss anemia - no reports of bleeding or black stools - 2 units PRBC last hospitalization due to upper GI bleed - f/u with GI 05/12- needs to evaluate need for restarting aspirin - cbc/diff- today  2. Type 2 diabetes mellitus with diabetic polyneuropathy, with long-term current use of insulin (HCC) - A1c 4.6 02/22 - no reported hypoglycemia - cont gabapentin for neuropathy - cont diabetic shoe  use  - CMP  3. Anemia of chronic disease - CBC with Differential/Platelet; Future - CMP  4. Chronic combined systolic and diastolic congestive heart failure (HCC) - no weight fluctuations, ankle edema or sob - cont imdue and coreg - cont DASH diet  5. Essential hypertension - controlled - cont imdur and coreg - cont DASH diet  6. Gastroesophageal reflux disease without esophagitis - stable with protonix - continue to avoid food triggers - recommend smoking cessation  7. Right hip pain - ambulating with cane - cont neurontin for pain - scheduled to see [facility] 05/17  8. ESRD (end stage renal disease) on dialysis ([place]) - cont dialysis   9. TOBACCO ABUSE - he does not have a plan to quit - cigarette amount varies, averaging between 1/2- 1ppd  10. Encounter for support and coordination of transition of care - DG Chest 2 View; Future   Total time: 31 minutes. Greater than 50% of total time spent doing patient education and coordination of care regarding smoking cessation, medication education and signs of bleeding.    Next appt: Visit date not found [place], [facility] [phone]

## 2021-02-26 ENCOUNTER — Telehealth: Payer: Self-pay

## 2021-02-26 DIAGNOSIS — Z992 Dependence on renal dialysis: Secondary | ICD-10-CM | POA: Diagnosis not present

## 2021-02-26 DIAGNOSIS — N186 End stage renal disease: Secondary | ICD-10-CM | POA: Diagnosis not present

## 2021-02-26 DIAGNOSIS — N2581 Secondary hyperparathyroidism of renal origin: Secondary | ICD-10-CM | POA: Diagnosis not present

## 2021-02-26 LAB — CBC WITH DIFFERENTIAL/PLATELET
Absolute Monocytes: 686 cells/uL (ref 200–950)
Basophils Absolute: 92 cells/uL (ref 0–200)
Basophils Relative: 1.4 %
Eosinophils Absolute: 231 cells/uL (ref 15–500)
Eosinophils Relative: 3.5 %
HCT: 33.1 % — ABNORMAL LOW (ref 38.5–50.0)
Hemoglobin: 10.4 g/dL — ABNORMAL LOW (ref 13.2–17.1)
Lymphs Abs: 1624 cells/uL (ref 850–3900)
MCH: 29.3 pg (ref 27.0–33.0)
MCHC: 31.4 g/dL — ABNORMAL LOW (ref 32.0–36.0)
MCV: 93.2 fL (ref 80.0–100.0)
MPV: 12 fL (ref 7.5–12.5)
Monocytes Relative: 10.4 %
Neutro Abs: 3967 cells/uL (ref 1500–7800)
Neutrophils Relative %: 60.1 %
Platelets: 198 10*3/uL (ref 140–400)
RBC: 3.55 10*6/uL — ABNORMAL LOW (ref 4.20–5.80)
RDW: 14.5 % (ref 11.0–15.0)
Total Lymphocyte: 24.6 %
WBC: 6.6 10*3/uL (ref 3.8–10.8)

## 2021-02-26 LAB — COMPREHENSIVE METABOLIC PANEL
AG Ratio: 1.1 (calc) (ref 1.0–2.5)
ALT: 10 U/L (ref 9–46)
AST: 13 U/L (ref 10–35)
Albumin: 3.8 g/dL (ref 3.6–5.1)
Alkaline phosphatase (APISO): 78 U/L (ref 35–144)
BUN/Creatinine Ratio: 7 (calc) (ref 6–22)
BUN: 55 mg/dL — ABNORMAL HIGH (ref 7–25)
CO2: 25 mmol/L (ref 20–32)
Calcium: 9.5 mg/dL (ref 8.6–10.3)
Chloride: 91 mmol/L — ABNORMAL LOW (ref 98–110)
Creat: 8 mg/dL — ABNORMAL HIGH (ref 0.70–1.18)
Globulin: 3.4 g/dL (calc) (ref 1.9–3.7)
Glucose, Bld: 119 mg/dL (ref 65–139)
Potassium: 4.1 mmol/L (ref 3.5–5.3)
Sodium: 136 mmol/L (ref 135–146)
Total Bilirubin: 0.4 mg/dL (ref 0.2–1.2)
Total Protein: 7.2 g/dL (ref 6.1–8.1)

## 2021-02-26 LAB — HEMOGLOBIN A1C
Hgb A1c MFr Bld: 4.9 % of total Hgb (ref <5.7)
Mean Plasma Glucose: 94 mg/dL
eAG (mmol/L): 5.2 mmol/L

## 2021-02-26 NOTE — Telephone Encounter (Signed)
Called patient, patient states he just had dialysis today and next one is on Monday

## 2021-02-26 NOTE — Telephone Encounter (Signed)
[facility] called with notification of critical lab:  Creatinine- High-8.0 (verified by repeat analysis)   To Amy

## 2021-02-26 NOTE — Telephone Encounter (Signed)
He is a dialysis patient. He states he was seen Wednesday. Can we call and ask him when his next scheduled dialysis is?

## 2021-02-26 NOTE — Telephone Encounter (Signed)
Patient called to have me reiterate what Jacob [name] had told him. Patient aware of lab results dated 02/25/2021. I have also faxed labs to the dialysis center.

## 2021-03-01 ENCOUNTER — Encounter: Payer: Self-pay | Admitting: Physician Assistant

## 2021-03-01 DIAGNOSIS — D631 Anemia in chronic kidney disease: Secondary | ICD-10-CM | POA: Diagnosis not present

## 2021-03-01 DIAGNOSIS — N2581 Secondary hyperparathyroidism of renal origin: Secondary | ICD-10-CM | POA: Diagnosis not present

## 2021-03-01 DIAGNOSIS — N186 End stage renal disease: Secondary | ICD-10-CM | POA: Diagnosis not present

## 2021-03-01 DIAGNOSIS — Z992 Dependence on renal dialysis: Secondary | ICD-10-CM | POA: Diagnosis not present

## 2021-03-01 NOTE — Progress Notes (Addendum)
Cardiology Office Note    Date:  03/02/2021   ID:  Jacob [name], DOB [birth year], MRN [id number]  PCP:  [name], NP  Cardiologist:  Jenkins Rouge, MD  Electrophysiologist:  None   Chief Complaint: f/u recent GI bleeding  History of Present Illness:   Jacob [name] is a 70 y.o. male with history of coronary artery disease status post CABG x2 in 2004, DES to VG-RI in 2/201, ischemic cardiomyopathy/chronic combined CHF, end-stage renal disease on hemodialysis, HIV, hypertension, hyperlipidemia, type 2 diabetes, depression, GI bleeding requiring transfusions - recurrent due to AVMs, anemia previously treated with IV iron, SDH, tobacco abuse, prior cocaine use, RBBB, peripheral neuropathy, gout, GERD.   Cardiac history dates back to 2004 when he presented with left arm tingling/pain, EF 20% and cath showing multivessel disease. After initial deferral, he underwent CABGx2. LV dysfunction persisted but he was not previously felt to be ICD candidate due to cocaine abuse at the time. LV dysfunction has fluctuated since that time from 35-50%. Last cath was in 11/2019 with DES to VG-RI. Last echo 09/2020 showed EF 40-45%, grade 2 DD, moderately dilated LV, mod LVH, normal PASP, mild MR, moderately dilated LAE. Over the years, has had progressive renal failure and initiated hemodialysis October 2020. In the setting of anemia, he has required outpatient iron infusions. EGD in February 2021 revealed angiodysplastic lesions with bleeding in the gastric fundus and body. He was last seen by cardiology in 09/2020 in the hospital during an admission for elevated troponin in the setting of ABL anemia/UGI bleed Hgb 6.5 and missed HD. Since that time he has had 6 additional medical admissions for multiple issues including small SDH and left femur fx following a fall, incidental Covid-19, and recurrent UGIB/anemia requiring tranfusions, several EGDs, and AVMs s/p clipping several times. After the admission in  January for SDH, his [name] was resumed. Cardiology was not consulted during that admission. Brilinta was no longer on his list in March. He has since been on ASA periodically interrupted for his GI bleeding. During some of these admission he left AMA. His last admission was 4/22-4/26 for recurrent GIB again requring transfusion/EGD. He refused SNF and declined to stay in the hospital any longer per DC summary.  He is seen back for follow-up today overall stable from cardiac standpoint. He has not had any recent angina. He has a history of atypical pain about midway down the torso in the left axillary line without any acceleration in symptoms. He has chronic unchanged DOE. He is most bothered by chronic right low back pain/hip pain worse at rest than ambulation. He is seeing orthopedics for this and is taking Tramadol but does not feel it works as well as Oxycodone. He states he was previously told there is bone degeneration in this area. Denies any symptoms of recurrent bleeding. He is not on atorvastatin for unclear reasons, see below.   Labwork independently reviewed: 02/2021 Hgb 10.4, K 4.1, Cr 8.0, LFTs ok 11/2020 LDL 79 (K[facility]) 11/2019 LDL 29   Past Medical History:  Diagnosis Date  . Acute respiratory failure ([place]) 03/01/2018  . Anemia   . Arthritis    "all over; mostly knees and back" (02/28/2018)  . Chronic combined systolic and diastolic CHF (congestive heart failure) ([place])   . Chronic lower back pain    stenosis  . Community acquired pneumonia 09/06/2013  . COPD (chronic obstructive pulmonary disease) ([place])   . Coronary atherosclerosis of native coronary artery  a. 02/2003 s/p CABG x 2 (VG->RI, VG->RPDA; b. 11/2019 PCI: LM nl, LAD 90d, D3 50, RI 100, LCX 100p, OM3 100 - fills via L->L collats from D2/dLAD, RCA 100p, VG->RPDA ok, VG->RI 95 (3.5x48 Synergy XD DES).  . Drug abuse ([place])    hx; tested for cocaine as recently as 2/08. says he is not using drugs now - avoided defib. for this  reason   . ESRD (end stage renal disease) ([place])    Hemo M-W-F- Richarda Blade  . Fall at home 10/2020  . GERD (gastroesophageal reflux disease)    takes OTC meds as needed  . GI bleeding    a. 11/2019 EGD: angiodysplastic lesions w/ bleeding s/p argon plasma/clipping/epi inj. Multiple admissions for the same.  . Glaucoma    uses eye drops daily  . Hepatitis B 1968   "tx'd w/isolation; caught it from toilet stools in gym"  . History of blood transfusion 03/01/2019  . History of colon polyps    benign  . History of gout    takes Allopurinol daily as well as Colchicine-if needed (02/28/2018)  . History of kidney stones   . HTN (hypertension)    takes Coreg,Imdur.and Apresoline daily  . Human immunodeficiency virus (HIV) disease ([place]) dx'd 67   on [place] as of 12/2020.    [name] Hyperlipidemia    takes Atorvastatin daily  . Ischemic cardiomyopathy    a. 01/2019 Echo: EF 40-45%, diffuse HK, mild basal septal hypertrophy. Diast dysfxn. Nl RV size/fxn. Sev dil LA. Triv MR/TR/PR.  [name] Muscle spasm    takes Zanaflex as needed  . Myocardial infarction ([place]) ~ 2004/2005  . Nocturia   . Peripheral neuropathy    takes gabapentin daily  . Pneumonia    "at least twice" (02/28/2018)  . SDH (subdural hematoma) ([place])   . Syphilis, unspecified   . Type II diabetes mellitus ([place]) 2004   Lantus daily.Average fasting blood sugar 125-199  . Wears glasses   . Wears partial dentures     Past Surgical History:  Procedure Laterality Date  . AV FISTULA PLACEMENT Left 08/02/2018   Procedure: ARTERIOVENOUS (AV) FISTULA CREATION  left arm radiocephlic;  Surgeon: Marty Heck, MD;  Location: [place];  Service: Vascular;  Laterality: Left;  . AV FISTULA PLACEMENT Left 08/01/2019   Procedure: LEFT BRACHIOCEPHALIC ARTERIOVENOUS (AV) FISTULA CREATION;  Surgeon: [name], MD;  Location: [place];  Service: Vascular;  Laterality: Left;  . BASCILIC VEIN TRANSPOSITION Left 10/03/2019   Procedure: BASILIC VEIN  TRANSPOSITION LEFT SECOND STAGE;  Surgeon: [name], MD;  Location: [place];  Service: Vascular;  Laterality: Left;  . BIOPSY  01/25/2021   Procedure: BIOPSY;  Surgeon: Doran Stabler, MD;  Location: [facility] ENDOSCOPY;  Service: Gastroenterology;;  . CARDIAC CATHETERIZATION  10/2002; 12/19/2004   [name] 03/08/2011  . COLONOSCOPY  2013   Dr.John Henrene Pastor   . CORONARY ARTERY BYPASS GRAFT  02/24/2003   CABG X2/notes 03/08/2011  . CORONARY STENT INTERVENTION N/A 12/19/2019   Procedure: CORONARY STENT INTERVENTION;  Surgeon: Jettie Booze, MD;  Location: [place] CV LAB;  Service: Cardiovascular;  Laterality: N/A;  . ENTEROSCOPY N/A 01/25/2021   Procedure: ENTEROSCOPY;  Surgeon: Doran Stabler, MD;  Location: [place];  Service: Gastroenterology;  Laterality: N/A;  . ENTEROSCOPY N/A 02/13/2021   Procedure: ENTEROSCOPY;  Surgeon: Jackquline Denmark, MD;  Location: [facility] ENDOSCOPY;  Service: Endoscopy;  Laterality: N/A;  . ESOPHAGOGASTRODUODENOSCOPY (EGD) WITH PROPOFOL N/A 02/08/2019  Procedure: ESOPHAGOGASTRODUODENOSCOPY (EGD) WITH PROPOFOL;  Surgeon: Milus Banister, MD;  Location: [facility] ENDOSCOPY;  Service: Gastroenterology;  Laterality: N/A;  . ESOPHAGOGASTRODUODENOSCOPY (EGD) WITH PROPOFOL N/A 12/22/2019   Procedure: ESOPHAGOGASTRODUODENOSCOPY (EGD) WITH PROPOFOL;  Surgeon: Lavena Bullion, DO;  Location: [place];  Service: Gastroenterology;  Laterality: N/A;  . ESOPHAGOGASTRODUODENOSCOPY (EGD) WITH PROPOFOL N/A 10/19/2020   Procedure: ESOPHAGOGASTRODUODENOSCOPY (EGD) WITH PROPOFOL;  Surgeon: Jackquline Denmark, MD;  Location: [facility] ENDOSCOPY;  Service: Endoscopy;  Laterality: N/A;  . ESOPHAGOGASTRODUODENOSCOPY (EGD) WITH PROPOFOL N/A 12/22/2020   Procedure: ESOPHAGOGASTRODUODENOSCOPY (EGD) WITH PROPOFOL;  Surgeon: Gatha Mayer, MD;  Location: [place];  Service: Endoscopy;  Laterality: N/A;  . ESOPHAGOGASTRODUODENOSCOPY (EGD) WITH PROPOFOL N/A 01/09/2021   Procedure:  ESOPHAGOGASTRODUODENOSCOPY (EGD) WITH PROPOFOL;  Surgeon: Irene Shipper, MD;  Location: [facility] ENDOSCOPY;  Service: Endoscopy;  Laterality: N/A;  . HEMOSTASIS CLIP PLACEMENT  12/22/2019   Procedure: HEMOSTASIS CLIP PLACEMENT;  Surgeon: Lavena Bullion, DO;  Location: [place] ENDOSCOPY;  Service: Gastroenterology;;  . HEMOSTASIS CLIP PLACEMENT  12/22/2020   Procedure: HEMOSTASIS CLIP PLACEMENT;  Surgeon: Gatha Mayer, MD;  Location: [facility] ENDOSCOPY;  Service: Endoscopy;;  . HEMOSTASIS CONTROL  12/22/2020   Procedure: HEMOSTASIS CONTROL/hemospray;  Surgeon: Gatha Mayer, MD;  Location: [place];  Service: Endoscopy;;  . HOT HEMOSTASIS N/A 02/08/2019   Procedure: HOT HEMOSTASIS (ARGON PLASMA COAGULATION/BICAP);  Surgeon: Milus Banister, MD;  Location: [facility] ENDOSCOPY;  Service: Gastroenterology;  Laterality: N/A;  . HOT HEMOSTASIS N/A 12/22/2019   Procedure: HOT HEMOSTASIS (ARGON PLASMA COAGULATION/BICAP);  Surgeon: Lavena Bullion, DO;  Location: [facility] ENDOSCOPY;  Service: Gastroenterology;  Laterality: N/A;  . HOT HEMOSTASIS N/A 10/19/2020   Procedure: HOT HEMOSTASIS (ARGON PLASMA COAGULATION/BICAP);  Surgeon: Jackquline Denmark, MD;  Location: [facility] ENDOSCOPY;  Service: Endoscopy;  Laterality: N/A;  . HOT HEMOSTASIS N/A 12/22/2020   Procedure: HOT HEMOSTASIS (ARGON PLASMA COAGULATION/BICAP);  Surgeon: Gatha Mayer, MD;  Location: [facility] ENDOSCOPY;  Service: Endoscopy;  Laterality: N/A;  . HOT HEMOSTASIS N/A 01/09/2021   Procedure: HOT HEMOSTASIS (ARGON PLASMA COAGULATION/BICAP);  Surgeon: Irene Shipper, MD;  Location: [facility] ENDOSCOPY;  Service: Endoscopy;  Laterality: N/A;  . HOT HEMOSTASIS N/A 01/25/2021   Procedure: HOT HEMOSTASIS (ARGON PLASMA COAGULATION/BICAP);  Surgeon: Doran Stabler, MD;  Location: [place];  Service: Gastroenterology;  Laterality: N/A;  . HOT HEMOSTASIS N/A 02/13/2021   Procedure: HOT HEMOSTASIS (ARGON PLASMA COAGULATION/BICAP);  Surgeon: Jackquline Denmark, MD;  Location: [facility] ENDOSCOPY;   Service: Endoscopy;  Laterality: N/A;  . INTERTROCHANTERIC HIP FRACTURE SURGERY Left 11/2006   [name] 03/08/2011  . INTRAVASCULAR ULTRASOUND/IVUS N/A 12/19/2019   Procedure: Intravascular Ultrasound/IVUS;  Surgeon: Jettie Booze, MD;  Location: [place] CV LAB;  Service: Cardiovascular;  Laterality: N/A;  . IR FLUORO GUIDE CV LINE RIGHT  07/24/2019  . IR FLUORO GUIDE CV LINE RIGHT  07/30/2019  . IR US GUIDE VASC ACCESS RIGHT  07/24/2019  . IR US GUIDE VASC ACCESS RIGHT  07/30/2019  . LAPAROSCOPIC CHOLECYSTECTOMY  05/2006  . LIGATION OF COMPETING BRANCHES OF ARTERIOVENOUS FISTULA Left 11/05/2018   Procedure: LIGATION OF COMPETING BRANCHES OF ARTERIOVENOUS FISTULA  LEFT  ARM;  Surgeon: Marty Heck, MD;  Location: [place];  Service: Vascular;  Laterality: Left;  . LUMBAR LAMINECTOMY/DECOMPRESSION MICRODISCECTOMY N/A 02/29/2016   Procedure: Left L4-5 Lateral Recess Decompression, Removal Extradural Intraspinal Facet Cyst;  Surgeon: Marybelle Killings, MD;  Location: [place];  Service: Orthopedics;  Laterality: N/A;  . MULTIPLE TOOTH EXTRACTIONS    .  ORIF MANDIBULAR FRACTURE Left 08/13/2004   ORIF of left body fracture mandible with KLS Martin 2.3-mm six hole/notes 03/08/2011  . RIGHT/LEFT HEART CATH AND CORONARY/GRAFT ANGIOGRAPHY N/A 12/19/2019   Procedure: RIGHT/LEFT HEART CATH AND CORONARY/GRAFT ANGIOGRAPHY;  Surgeon: Jettie Booze, MD;  Location: [place] CV LAB;  Service: Cardiovascular;  Laterality: N/A;  . SCLEROTHERAPY  12/22/2019   Procedure: SCLEROTHERAPY;  Surgeon: Lavena Bullion, DO;  Location: [place];  Service: Gastroenterology;;  . Clide Deutscher  02/13/2021   Procedure: Clide Deutscher;  Surgeon: Jackquline Denmark, MD;  Location: [facility] ENDOSCOPY;  Service: Endoscopy;;    Current Medications: Current Meds  Medication Sig  . acetaminophen (TYLENOL) 650 MG CR tablet Take 1,300 mg by mouth 3 (three) times daily.  [name] allopurinol (ZYLOPRIM) 100 MG tablet TAKE 1 TABLET(100 MG) BY  MOUTH DAILY  . ANORO ELLIPTA 62.5-25 MCG/INH AEPB INHALE 1 PUFF BY MOUTH EVERY DAY  . B-D UF III MINI PEN NEEDLES 31G X 5 MM MISC USE FOUR TIMES DAILY  . bictegravir-emtricitabine-tenofovir AF (BIKTARVY) 50-200-25 MG TABS tablet Take 1 tablet by mouth daily.  . Calcium Acetate 667 MG TABS Take 667-1,334 mg by mouth See admin instructions. Take 1,334 mg by mouth three times a day with meals and 667 mg with each snack  . carvedilol (COREG) 3.125 MG tablet Take 1 tablet (3.125 mg total) by mouth 2 (two) times daily.  . Cinacalcet HCl (SENSIPAR PO) Take 1 tablet by mouth 3 (three) times a week. At dialysis  . colchicine 0.6 MG tablet Take 0.6-1.2 mg by mouth daily as needed (as directed for gout flares).  . diclofenac Sodium (VOLTAREN) 1 % GEL APPLY 2 GRAMS TO THE AFFECTED AREA THREE TIMES DAILY AS NEEDED FOR PAIN  . folic acid (FOLVITE) 1 MG tablet Take 1 tablet (1 mg total) by mouth daily.  [name] gabapentin (NEURONTIN) 300 MG capsule Take 1 capsule (300 mg total) by mouth daily. Dose change due to renal function  . glucose blood (ONETOUCH ULTRA) test strip Check blood sugar three times daily. Dx:E11.40  . insulin lispro (HUMALOG) 100 UNIT/ML KwikPen INJECT 20 UNITS UNDER THE SKIN THREE TIMES DAILY AS NEEDED FOR HIGH BLOOD SUGAR(ABOVE 150)  . iron sucrose in sodium chloride 0.9 % 100 mL Iron Sucrose (Venofer)  . isosorbide mononitrate (IMDUR) 30 MG 24 hr tablet TAKE 1 TABLET BY MOUTH EVERY DAY  . Lancets (ONETOUCH DELICA PLUS OVFIEP32R) MISC Inject 1 Device as directed 3 (three) times daily. Dx: E11.40  . latanoprost (XALATAN) 0.005 % ophthalmic solution Place 1 drop into both eyes at bedtime.  . lidocaine-prilocaine (EMLA) cream Apply 1 application topically every Monday, Wednesday, and Friday with hemodialysis.  . Methoxy PEG-Epoetin Beta (MIRCERA IJ) Mircera  . misoprostol (CYTOTEC) 100 MCG tablet Take 1 tablet (100 mcg total) by mouth every 6 (six) hours.  . multivitamin (RENA-VIT) TABS tablet Take  1 tablet by mouth daily.  . nitroGLYCERIN (NITROSTAT) 0.3 MG SL tablet ONE TABLET UNDER TONGUE AS NEEDED FOR CHEST PAIN  . polyethylene glycol (MIRALAX / GLYCOLAX) 17 g packet Take 17 g by mouth daily as needed for mild constipation.  . timolol (TIMOPTIC) 0.5 % ophthalmic solution Place 1 drop into both eyes in the morning.  Pt states he was told to hold Protonix until upcoming "breathalyzer test" to recheck bacteria.   Allergies:   Augmentin [amoxicillin-pot clavulanate], Mucinex fast-max, and Amphetamines   Social History   Socioeconomic History  . Marital status: Single    Spouse name: Not  on file  . Number of children: 1  . Years of education: Not on file  . Highest education level: Not on file  Occupational History  . Occupation: retired  Tobacco Use  . Smoking status: Current Every Day Smoker    Packs/day: 0.50    Years: 43.00    Pack years: 21.50    Types: Cigarettes  . Smokeless tobacco: Never Used  Vaping Use  . Vaping Use: Never used  Substance and Sexual Activity  . Alcohol use: Not Currently    Alcohol/week: 12.0 standard drinks    Types: 12 Standard drinks or equivalent per week    Comment: occassional  . Drug use: Not Currently    Types: Cocaine    Comment: hx of crack/cocaine 56yrs ago 10/01/2019- none  . Sexual activity: Yes    Partners: Male  Other Topics Concern  . Not on file  Social History Narrative   Retired.       As of 05/2015:   Diet: No salt    Caffeine   Married: Single    House: Condo, 2 stories, 1 person (self)    Pets: No    Current/Past profession: N/A   Exercise: walks daily    Living Will: Yes    DNR   POA/HPOA: No       Social Determinants of Health   Financial Resource Strain: Low Risk   . Difficulty of Paying Living Expenses: Not hard at all  Food Insecurity: No Food Insecurity  . Worried About Charity fundraiser in the Last Year: Never true  . Ran Out of Food in the Last Year: Never true  Transportation Needs: No  Transportation Needs  . Lack of Transportation (Medical): No  . Lack of Transportation (Non-Medical): No  Physical Activity: Not on file  Stress: Not on file  Social Connections: Not on file     Family History:  The patient's family history includes Alzheimer's disease in his mother and sister; Diabetes in his brother, brother, and sister; Drug abuse in his brother; Heart attack in his brother; Heart failure in his father; Hypertension in his brother and father; Stroke in his sister. There is no history of Colon cancer.  ROS:   Please see the history of present illness. All other systems are reviewed and otherwise negative.    EKGs/Labs/Other Studies Reviewed:    Studies reviewed are outlined and summarized above. Reports included below if pertinent.  2D echo 09/2020  1. Left ventricular ejection fraction, by estimation, is 40 to 45%. The  left ventricle has mildly decreased function. The left ventricle  demonstrates regional wall motion abnormalities (see scoring  diagram/findings for description). The left ventricular  internal cavity size was moderately dilated. There is moderate concentric  left ventricular hypertrophy. Left ventricular diastolic parameters are  consistent with Grade II diastolic dysfunction (pseudonormalization).  Elevated left atrial pressure.  2. Right ventricular systolic function is normal. The right ventricular  size is normal. There is normal pulmonary artery systolic pressure.  3. Left atrial size was moderately dilated.  4. The mitral valve is normal in structure. Mild mitral valve  regurgitation. No evidence of mitral stenosis.  5. The aortic valve is normal in structure. Aortic valve regurgitation is  not visualized. No aortic stenosis is present.  6. The inferior vena cava is dilated in size with >50% respiratory  variability, suggesting right atrial pressure of 8 mmHg.   R/LHC 11/2019  Prox RCA lesion is 100% stenosed. SVG to RCA  is  widely patent.  Dist LAD lesion is 90% stenosed. Too small and distal for PCI.  3rd Diag lesion is 50% stenosed.  Prox Cx lesion is 100% stenosed. Left to left collaterals.  Ramus lesion is 100% stenosed.  SVG to ramus with Origin to Prox Graft lesion is 95% stenosed.  A drug-eluting stent was successfully placed using a SYNERGY XD 3.50X48, postdilated to >4 mm. Optimized with IVUS.  Post intervention, there is a 0% residual stenosis.  There is moderate to severe left ventricular systolic dysfunction.  There is no aortic valve stenosis.  Hemodynamic findings consistent with moderate pulmonary hypertension.   Continue dual antiplatelet therapy for 6 months at least.  COnsider clopidogrel longer term if there is no interaction with his HIV meds, given the diffuse nature of CAD.      EKG:  EKG is not ordered today  Recent Labs: 12/15/2020: TSH 0.49 02/14/2021: B Natriuretic Peptide 343.7; Magnesium 1.7 02/25/2021: ALT 10; BUN 55; Creat 8.0; Hemoglobin 10.4; Platelets 198; Potassium 4.1; Sodium 136  Recent Lipid Panel    Component Value Date/Time   CHOL 143 12/15/2020 1105   CHOL 121 08/07/2015 1041   TRIG 248 (H) 12/15/2020 1105   HDL 31 (L) 12/15/2020 1105   HDL 32 (L) 08/07/2015 1041   CHOLHDL 4.6 12/15/2020 1105   VLDL 19 12/21/2019 0352   LDLCALC 79 12/15/2020 1105    PHYSICAL EXAM:    VS:  BP (!) 110/56   Pulse 82   Ht 6\' 2"  (1.88 m)   Wt 171 lb (77.6 kg)   SpO2 99%   BMI 21.96 kg/m   BMI: Body mass index is 21.96 kg/m.  GEN: Thin AAM male in no acute distress HEENT: normocephalic, atraumatic Neck: no JVD, carotid bruits, or masses Cardiac: RRR; no murmurs, rubs, or gallops, no edema, good pulses in LE Respiratory:  clear to auscultation bilaterally, normal work of breathing GI: soft, nontender, nondistended, + BS MS: no deformity or atrophy, left arm fistula Skin: warm and dry, no rash Neuro:  Alert and Oriented x 3, Strength and sensation are intact,  follows commands Psych: euthymic mood, full affect  Wt Readings from Last 3 Encounters:  03/02/21 171 lb (77.6 kg)  02/25/21 170 lb 6.4 oz (77.3 kg)  02/15/21 172 lb 6.4 oz (78.2 kg)     ASSESSMENT & PLAN:   1. CAD with HLD goal LDL <70 - clinically appears stable without any accelerating angina or dyspnea. His major issues recently have been with recurrent GI bleeding and anemia. Therefore would continue conservative management of his coronary disease with medical therapy. He has follow-up with GI this week on 03/04/21 per his report, at which time I asked him to discuss whether they feel he can restart his aspirin. He should stay off Brilinta indefinitely - not only has he completed >1 year of therapy but he now carries a history of subdural hematoma which is a contraindication to Brilinta. (Notes from Dr. [name] earlier this year also indicate he was OK with only monotherapy with ASA.) Continue low dose beta blocker as tolerated. His VS are monitored with HD sessions. He states Dr. Jimmy Footman stopped his atorvastatin. I do not see anything in the chart to corroborate this. It seemed to fall off his medicine list in December 2021 when all the notes inferred the plan to continue statin. We'll have our nurse reach out to renal and if they do not have any evidence that it was intentionally stopped, will  plan to restart atorvastatin 40mg  daily with recheck liver/lipids in 2 months.  2. Chronic combined CHF/ICM - appears euvolemic today. Volume managed by HD.  Last echo 09/2020 showed EF 40-45% therefore the issue of ICD does not need to be revisited. Continue BB and Imdur as tolerated. Avoid ACEi/ARB/ARNI/spironolactone due to renal disease.  3. Recurrent GI bleeding - crux of issues lately, requring multiple readmissions. Keep follow-up as scheduled with GI - suggest their routine surveillance of his hemoglobin levels.  4. Essential HTN - controlled today. No changes made.  5. Right hip pain - he  also reports chronic right hip pain. He has excellent pulses in his right lower extremity as well as a normal hair pattern that is not consistent with occlusive PAD. Have encouraged f/u with orthopedic team as planned.  Disposition: F/u with Dr. [name] in 6 months.   Medication Adjustments/Labs and Tests Ordered: Current medicines are reviewed at length with the patient today.  Concerns regarding medicines are outlined above. Medication changes, Labs and Tests ordered today are summarized above and listed in the Patient Instructions accessible in Encounters.   Signed, Charlie Pitter, PA-C  03/02/2021 10:56 AM    [address], [place], [place]  [address] Phone: [phone]; Fax: [phone]

## 2021-03-02 ENCOUNTER — Encounter: Payer: Self-pay | Admitting: Physician Assistant

## 2021-03-02 ENCOUNTER — Telehealth: Payer: Self-pay | Admitting: Cardiovascular Disease

## 2021-03-02 ENCOUNTER — Ambulatory Visit (INDEPENDENT_AMBULATORY_CARE_PROVIDER_SITE_OTHER): Payer: Medicare Other | Admitting: Physician Assistant

## 2021-03-02 ENCOUNTER — Other Ambulatory Visit: Payer: Self-pay | Admitting: *Deleted

## 2021-03-02 ENCOUNTER — Other Ambulatory Visit: Payer: Self-pay

## 2021-03-02 VITALS — BP 110/56 | HR 82 | Ht 74.0 in | Wt 171.0 lb

## 2021-03-02 DIAGNOSIS — I2510 Atherosclerotic heart disease of native coronary artery without angina pectoris: Secondary | ICD-10-CM | POA: Diagnosis not present

## 2021-03-02 DIAGNOSIS — Z8719 Personal history of other diseases of the digestive system: Secondary | ICD-10-CM

## 2021-03-02 DIAGNOSIS — E785 Hyperlipidemia, unspecified: Secondary | ICD-10-CM

## 2021-03-02 DIAGNOSIS — M25551 Pain in right hip: Secondary | ICD-10-CM | POA: Diagnosis not present

## 2021-03-02 DIAGNOSIS — I10 Essential (primary) hypertension: Secondary | ICD-10-CM

## 2021-03-02 DIAGNOSIS — I255 Ischemic cardiomyopathy: Secondary | ICD-10-CM | POA: Diagnosis not present

## 2021-03-02 DIAGNOSIS — I5042 Chronic combined systolic (congestive) and diastolic (congestive) heart failure: Secondary | ICD-10-CM

## 2021-03-02 DIAGNOSIS — Z79899 Other long term (current) drug therapy: Secondary | ICD-10-CM

## 2021-03-02 MED ORDER — ATORVASTATIN CALCIUM 10 MG PO TABS: 10.0000 mg | ORAL_TABLET | Freq: Every day | ORAL | 3 refills | Status: DC

## 2021-03-02 MED ORDER — ONETOUCH ULTRA VI STRP: ORAL_STRIP | 3 refills | Status: DC

## 2021-03-02 NOTE — Patient Instructions (Addendum)
Medication Instructions:  Your physician recommends that you continue on your current medications as directed. Please refer to the Current Medication list given to you today.  *If you need a refill on your cardiac medications before your next appointment, please call your pharmacy*   Lab Work: None ordered  If you have labs (blood work) drawn today and your tests are completely normal, you will receive your results only by: [name] MyChart Message (if you have MyChart) OR . A paper copy in the mail If you have any lab test that is abnormal or we need to change your treatment, we will call you to review the results.   Testing/Procedures: None ordered   Follow-Up: At [facility], you and your health needs are our priority.  As part of our continuing mission to provide you with exceptional heart care, we have created designated Provider Care Teams.  These Care Teams include your primary Cardiologist (physician) and Advanced Practice Providers (APPs -  Physician Assistants and Nurse Practitioners) who all work together to provide you with the care you need, when you need it.  We recommend signing up for the patient portal called "MyChart".  Sign up information is provided on this After Visit Summary.  MyChart is used to connect with patients for Virtual Visits (Telemedicine).  Patients are able to view lab/test results, encounter notes, upcoming appointments, etc.  Non-urgent messages can be sent to your provider as well.   To learn more about what you can do with MyChart, go to [email or web address].    Your next appointment:   6 month(s)  The format for your next appointment:   In Person  Provider:   You may see Jenkins Rouge, MD or one of the following Advanced Practice Providers on your designated Care Team:    Kathyrn Drown, NP    Other Instructions Please talk to your GI Dr to see if they want you to restart the Aspirin or stay off of it.

## 2021-03-02 NOTE — Telephone Encounter (Signed)
Received fax from [facility].

## 2021-03-02 NOTE — Telephone Encounter (Signed)
This may be the answer we were looking for. If we did not uncover any reasons from nephrology to stop, we can try a lower dose this time. He was previously prescribed anywhere from 20 to 80mg . Can trial atorvastatin 10mg  daily with recheck LFTs/lipids in 2 months. Let us know if cramping recurs.

## 2021-03-02 NOTE — Telephone Encounter (Signed)
Returned call to pt.  He has been made aware we would like to try Atorvastatin 10 mg daily to see if he can tolerate this and come in 05/04/2021 for fasting lipid/lft. Pt agreed and rx sent to [facility], per pt request.

## 2021-03-02 NOTE — Telephone Encounter (Signed)
Pt c/o medication issue:  1. Name of Medication:  Atorvastation  2. How are you currently taking this medication (dosage and times per day)? Na   3. Are you having a reaction (difficulty breathing--STAT)? Na   4. What is your medication issue? He stated he was seen today and wanted to like Danna know he was taken off of this med because he cause him to cramp

## 2021-03-03 DIAGNOSIS — N186 End stage renal disease: Secondary | ICD-10-CM | POA: Diagnosis not present

## 2021-03-03 DIAGNOSIS — D631 Anemia in chronic kidney disease: Secondary | ICD-10-CM | POA: Diagnosis not present

## 2021-03-03 DIAGNOSIS — N2581 Secondary hyperparathyroidism of renal origin: Secondary | ICD-10-CM | POA: Diagnosis not present

## 2021-03-03 DIAGNOSIS — Z992 Dependence on renal dialysis: Secondary | ICD-10-CM | POA: Diagnosis not present

## 2021-03-04 ENCOUNTER — Other Ambulatory Visit: Payer: Self-pay

## 2021-03-04 ENCOUNTER — Encounter: Payer: Self-pay | Admitting: Physician Assistant

## 2021-03-04 ENCOUNTER — Ambulatory Visit (INDEPENDENT_AMBULATORY_CARE_PROVIDER_SITE_OTHER)
Admission: RE | Admit: 2021-03-04 | Discharge: 2021-03-04 | Disposition: A | Discharge status: Home / Self Care | Payer: Medicare Other | Source: Ambulatory Visit | Attending: Orthopedic Surgery | Admitting: Orthopedic Surgery

## 2021-03-04 ENCOUNTER — Ambulatory Visit (INDEPENDENT_AMBULATORY_CARE_PROVIDER_SITE_OTHER): Payer: Medicare Other | Admitting: Physician Assistant

## 2021-03-04 ENCOUNTER — Telehealth: Payer: Self-pay | Admitting: Physician Assistant

## 2021-03-04 VITALS — BP 130/60 | HR 70 | Ht 74.0 in | Wt 170.4 lb

## 2021-03-04 DIAGNOSIS — Z8719 Personal history of other diseases of the digestive system: Secondary | ICD-10-CM

## 2021-03-04 DIAGNOSIS — Q2730 Arteriovenous malformation, site unspecified: Secondary | ICD-10-CM

## 2021-03-04 DIAGNOSIS — I25118 Atherosclerotic heart disease of native coronary artery with other forms of angina pectoris: Secondary | ICD-10-CM | POA: Diagnosis not present

## 2021-03-04 DIAGNOSIS — I517 Cardiomegaly: Secondary | ICD-10-CM | POA: Diagnosis not present

## 2021-03-04 NOTE — Telephone Encounter (Signed)
Patient called to follow up on lab hemoglobin count

## 2021-03-04 NOTE — Progress Notes (Signed)
Reviewed and agree with management plan.  Aerial Dilley T. Lamarkus Nebel, MD FACG [phone]  

## 2021-03-04 NOTE — Telephone Encounter (Signed)
Returned patient  call letting him know that [facility] said that his results would be in tomorrow and they would fax them over then.

## 2021-03-04 NOTE — Patient Instructions (Signed)
If you are age 72 or older, your body mass index should be between 23-30. Your Body mass index is 21.87 kg/m. If this is out of the aforementioned range listed, please consider follow up with your Primary Care Provider.  If you are age 13 or younger, your body mass index should be between 19-25. Your Body mass index is 21.87 kg/m. If this is out of the aformentioned range listed, please consider follow up with your Primary Care Provider.   Your provider has requested that you go to the basement level for lab work before leaving today. Press "B" on the elevator. The lab is located at the first door on the left as you exit the elevator.  Your provider has requested that you have an  x ray before leaving today. Please go to the basement floor to our Radiology department for the test.  You can restart Protonix on Monday.   Due to recent changes in healthcare laws, you may see the results of your imaging and laboratory studies on MyChart before your provider has had a chance to review them.  We understand that in some cases there may be results that are confusing or concerning to you. Not all laboratory results come back in the same time frame and the provider may be waiting for multiple results in order to interpret others.  Please give [place] 48 hours in order for your provider to thoroughly review all the results before contacting the office for clarification of your results.   Thank you for choosing me and [facility].  Ellouise Newer, PA-C

## 2021-03-04 NOTE — Progress Notes (Addendum)
Chief Complaint: Follow-up hospitalization for GI bleed  HPI:    Jacob [name] is a 70 year old male with a past medical history as listed below including ESRD on HD, HIV, COPD, CAD and multiple others, known to Dr. Fuller Plan, who presents to clinic today for follow-up after being seen in the hospital for GI bleed.     01/24/2021 consult by our service for anemia and a GI bleed.  At that time underwent small bowel endoscopy as below with duodenal AVMs treated with APC.  01/25/2021 biopsies came back with chronic active gastritis positive for H. pylori.  Patient was started on Flagyl 250 4 times daily x10 days, bismuth subsalicylate, doxycycline and omeprazole.  It was recommended he have a urea breath test in 4 weeks to confirm eradication.    02/12/2021 patient consulted by our service in the hospital for fatigue and melena.  At that time had gotten up that very morning and had multiple dark tarry stools.  At that time plan was to repeat an enteroscopy and continue pantoprazole 40 mg twice daily.    02/13/2021 enteroscopy with actively bleeding AVM versus Dula Foy lesion in the duodenal bulb status post epi/APC and gastric AVMs status post APC.  Patient was given Protonix infusion for 72 hours and transfuse with 2 further units of PRBCs.  Recommended that if he had active recurrent bleeding then IR consultation be done for mesenteric angio/GDA embolization.  No further bleeding recommend relook EGD in 6 to 8 weeks.    02/15/2021 patient was seen by our service and rounding that time patient had had a dark stool overnight and IR was asked to see because hemoglobin is gone from 8-7.4 overnight.  At that time there is no plans for further procedures and his diet was advanced.    02/25/2021 CBC with a hemoglobin 10.4 (9.5 on 4/26).    03/02/2021 patient seen in follow-up by cardiology.  It was recommended he discuss with [place] whether or not he can restart his aspirin.  He was discussed he should stay off Brilinta  indefinitely.    Today, the patient tells me that he is doing well, he has seen no further dark stools and is in a good mood.  He is having some reflux breakthrough since he has stopped his Protonix in preparation to do his H. pylori breath test on Monday 03/08/21.  Overall no new complaints.    Denies fever, chills, blood in his stool, abdominal pain or symptoms that awaken him from sleep.     GI history: 01/25/2021 small bowel endoscopy Dr. Loletha Carrow: Gastritis and a few bleeding angioectasias in the duodenum treated with APC, nodular mucosa in the duodenal bulb and first portion of the duodenum  01/09/2021 EGD for GI bleed 1. Bleeding gastric AVMs status post successful APC therapy 2. Otherwise unremarkable upper endoscopy.  12/22/2020 EGD for GI bleed - Blood in the duodenal bulb. - Nodule found in the duodenum. steady non-pulsatile bleeding seen at base, very close to distal aspect pylorus so positioning difficult - ? AVM Treated with argon plasma coagulation (APC) - partially successful Clips (MR conditional) were placed. hemostatic spray applied. No bleeding at end - The examination was otherwise normal.  10/19/20 EGD for GI bleed Gastric AVMs s/p APC treatment. - No specimens collected  12/22/19 EGD --bleeding gastric AVMs  April 2020 EGD --Gastric and duodenal bulb AVMs  Dec 2013 screening colonoscopy --moderate diverticulosis --2 mm polyp ( removed but not retrieved)  Past Medical History:  Diagnosis  Date  . Acute respiratory failure ([place]) 03/01/2018  . Anemia   . Arthritis    "all over; mostly knees and back" (02/28/2018)  . Chronic combined systolic and diastolic CHF (congestive heart failure) ([place])   . Chronic lower back pain    stenosis  . Community acquired pneumonia 09/06/2013  . COPD (chronic obstructive pulmonary disease) ([place])   . Coronary atherosclerosis of native coronary artery    a. 02/2003 s/p CABG x 2 (VG->RI, VG->RPDA; b. 11/2019 PCI: LM nl, LAD 90d, D3 50,  RI 100, LCX 100p, OM3 100 - fills via L->L collats from D2/dLAD, RCA 100p, VG->RPDA ok, VG->RI 95 (3.5x48 Synergy XD DES).  . Drug abuse ([place])    hx; tested for cocaine as recently as 2/08. says he is not using drugs now - avoided defib. for this reason   . ESRD (end stage renal disease) ([place])    Hemo M-W-F- Richarda Blade  . Fall at home 10/2020  . GERD (gastroesophageal reflux disease)    takes OTC meds as needed  . GI bleeding    a. 11/2019 EGD: angiodysplastic lesions w/ bleeding s/p argon plasma/clipping/epi inj. Multiple admissions for the same.  . Glaucoma    uses eye drops daily  . Hepatitis B 1968   "tx'd w/isolation; caught it from toilet stools in gym"  . History of blood transfusion 03/01/2019  . History of colon polyps    benign  . History of gout    takes Allopurinol daily as well as Colchicine-if needed (02/28/2018)  . History of kidney stones   . HTN (hypertension)    takes Coreg,Imdur.and Apresoline daily  . Human immunodeficiency virus (HIV) disease ([place]) dx'd 31   on Tat Momoli as of 12/2020.    [name] Hyperlipidemia   . Ischemic cardiomyopathy    a. 01/2019 Echo: EF 40-45%, diffuse HK, mild basal septal hypertrophy. Diast dysfxn. Nl RV size/fxn. Sev dil LA. Triv MR/TR/PR.  [name] Muscle spasm    takes Zanaflex as needed  . Myocardial infarction ([place]) ~ 2004/2005  . Nocturia   . Peripheral neuropathy    takes gabapentin daily  . Pneumonia    "at least twice" (02/28/2018)  . SDH (subdural hematoma) ([place])   . Syphilis, unspecified   . Type II diabetes mellitus ([place]) 2004   Lantus daily.Average fasting blood sugar 125-199  . Wears glasses   . Wears partial dentures     Past Surgical History:  Procedure Laterality Date  . AV FISTULA PLACEMENT Left 08/02/2018   Procedure: ARTERIOVENOUS (AV) FISTULA CREATION  left arm radiocephlic;  Surgeon: Marty Heck, MD;  Location: [place];  Service: Vascular;  Laterality: Left;  . AV FISTULA PLACEMENT Left 08/01/2019   Procedure: LEFT  BRACHIOCEPHALIC ARTERIOVENOUS (AV) FISTULA CREATION;  Surgeon: [name], MD;  Location: [place];  Service: Vascular;  Laterality: Left;  . BASCILIC VEIN TRANSPOSITION Left 10/03/2019   Procedure: BASILIC VEIN TRANSPOSITION LEFT SECOND STAGE;  Surgeon: [name], MD;  Location: [place];  Service: Vascular;  Laterality: Left;  . BIOPSY  01/25/2021   Procedure: BIOPSY;  Surgeon: Doran Stabler, MD;  Location: [facility] ENDOSCOPY;  Service: Gastroenterology;;  . CARDIAC CATHETERIZATION  10/2002; 12/19/2004   [name] 03/08/2011  . COLONOSCOPY  2013   Dr.John Henrene Pastor   . CORONARY ARTERY BYPASS GRAFT  02/24/2003   CABG X2/notes 03/08/2011  . CORONARY STENT INTERVENTION N/A 12/19/2019   Procedure: CORONARY STENT INTERVENTION;  Surgeon: Jettie Booze, MD;  Location:  [place] INVASIVE CV LAB;  Service: Cardiovascular;  Laterality: N/A;  . ENTEROSCOPY N/A 01/25/2021   Procedure: ENTEROSCOPY;  Surgeon: Doran Stabler, MD;  Location: [place];  Service: Gastroenterology;  Laterality: N/A;  . ENTEROSCOPY N/A 02/13/2021   Procedure: ENTEROSCOPY;  Surgeon: Jackquline Denmark, MD;  Location: [facility] ENDOSCOPY;  Service: Endoscopy;  Laterality: N/A;  . ESOPHAGOGASTRODUODENOSCOPY (EGD) WITH PROPOFOL N/A 02/08/2019   Procedure: ESOPHAGOGASTRODUODENOSCOPY (EGD) WITH PROPOFOL;  Surgeon: Milus Banister, MD;  Location: [place];  Service: Gastroenterology;  Laterality: N/A;  . ESOPHAGOGASTRODUODENOSCOPY (EGD) WITH PROPOFOL N/A 12/22/2019   Procedure: ESOPHAGOGASTRODUODENOSCOPY (EGD) WITH PROPOFOL;  Surgeon: Lavena Bullion, DO;  Location: [place];  Service: Gastroenterology;  Laterality: N/A;  . ESOPHAGOGASTRODUODENOSCOPY (EGD) WITH PROPOFOL N/A 10/19/2020   Procedure: ESOPHAGOGASTRODUODENOSCOPY (EGD) WITH PROPOFOL;  Surgeon: Jackquline Denmark, MD;  Location: [facility] ENDOSCOPY;  Service: Endoscopy;  Laterality: N/A;  . ESOPHAGOGASTRODUODENOSCOPY (EGD) WITH PROPOFOL N/A 12/22/2020   Procedure: ESOPHAGOGASTRODUODENOSCOPY (EGD)  WITH PROPOFOL;  Surgeon: Gatha Mayer, MD;  Location: [place];  Service: Endoscopy;  Laterality: N/A;  . ESOPHAGOGASTRODUODENOSCOPY (EGD) WITH PROPOFOL N/A 01/09/2021   Procedure: ESOPHAGOGASTRODUODENOSCOPY (EGD) WITH PROPOFOL;  Surgeon: Irene Shipper, MD;  Location: [facility] ENDOSCOPY;  Service: Endoscopy;  Laterality: N/A;  . HEMOSTASIS CLIP PLACEMENT  12/22/2019   Procedure: HEMOSTASIS CLIP PLACEMENT;  Surgeon: Lavena Bullion, DO;  Location: [place] ENDOSCOPY;  Service: Gastroenterology;;  . HEMOSTASIS CLIP PLACEMENT  12/22/2020   Procedure: HEMOSTASIS CLIP PLACEMENT;  Surgeon: Gatha Mayer, MD;  Location: [facility] ENDOSCOPY;  Service: Endoscopy;;  . HEMOSTASIS CONTROL  12/22/2020   Procedure: HEMOSTASIS CONTROL/hemospray;  Surgeon: Gatha Mayer, MD;  Location: [place];  Service: Endoscopy;;  . HOT HEMOSTASIS N/A 02/08/2019   Procedure: HOT HEMOSTASIS (ARGON PLASMA COAGULATION/BICAP);  Surgeon: Milus Banister, MD;  Location: [facility] ENDOSCOPY;  Service: Gastroenterology;  Laterality: N/A;  . HOT HEMOSTASIS N/A 12/22/2019   Procedure: HOT HEMOSTASIS (ARGON PLASMA COAGULATION/BICAP);  Surgeon: Lavena Bullion, DO;  Location: [facility] ENDOSCOPY;  Service: Gastroenterology;  Laterality: N/A;  . HOT HEMOSTASIS N/A 10/19/2020   Procedure: HOT HEMOSTASIS (ARGON PLASMA COAGULATION/BICAP);  Surgeon: Jackquline Denmark, MD;  Location: [facility] ENDOSCOPY;  Service: Endoscopy;  Laterality: N/A;  . HOT HEMOSTASIS N/A 12/22/2020   Procedure: HOT HEMOSTASIS (ARGON PLASMA COAGULATION/BICAP);  Surgeon: Gatha Mayer, MD;  Location: [facility] ENDOSCOPY;  Service: Endoscopy;  Laterality: N/A;  . HOT HEMOSTASIS N/A 01/09/2021   Procedure: HOT HEMOSTASIS (ARGON PLASMA COAGULATION/BICAP);  Surgeon: Irene Shipper, MD;  Location: [facility] ENDOSCOPY;  Service: Endoscopy;  Laterality: N/A;  . HOT HEMOSTASIS N/A 01/25/2021   Procedure: HOT HEMOSTASIS (ARGON PLASMA COAGULATION/BICAP);  Surgeon: Doran Stabler, MD;  Location: [place];  Service:  Gastroenterology;  Laterality: N/A;  . HOT HEMOSTASIS N/A 02/13/2021   Procedure: HOT HEMOSTASIS (ARGON PLASMA COAGULATION/BICAP);  Surgeon: Jackquline Denmark, MD;  Location: [facility] ENDOSCOPY;  Service: Endoscopy;  Laterality: N/A;  . INTERTROCHANTERIC HIP FRACTURE SURGERY Left 11/2006   [name] 03/08/2011  . INTRAVASCULAR ULTRASOUND/IVUS N/A 12/19/2019   Procedure: Intravascular Ultrasound/IVUS;  Surgeon: Jettie Booze, MD;  Location: [place] CV LAB;  Service: Cardiovascular;  Laterality: N/A;  . IR FLUORO GUIDE CV LINE RIGHT  07/24/2019  . IR FLUORO GUIDE CV LINE RIGHT  07/30/2019  . IR US GUIDE VASC ACCESS RIGHT  07/24/2019  . IR US GUIDE VASC ACCESS RIGHT  07/30/2019  . LAPAROSCOPIC CHOLECYSTECTOMY  05/2006  . LIGATION OF COMPETING BRANCHES OF ARTERIOVENOUS FISTULA Left 11/05/2018  Procedure: LIGATION OF COMPETING BRANCHES OF ARTERIOVENOUS FISTULA  LEFT  ARM;  Surgeon: Marty Heck, MD;  Location: [place];  Service: Vascular;  Laterality: Left;  . LUMBAR LAMINECTOMY/DECOMPRESSION MICRODISCECTOMY N/A 02/29/2016   Procedure: Left L4-5 Lateral Recess Decompression, Removal Extradural Intraspinal Facet Cyst;  Surgeon: Marybelle Killings, MD;  Location: [place];  Service: Orthopedics;  Laterality: N/A;  . MULTIPLE TOOTH EXTRACTIONS    . ORIF MANDIBULAR FRACTURE Left 08/13/2004   ORIF of left body fracture mandible with KLS Martin 2.3-mm six hole/notes 03/08/2011  . RIGHT/LEFT HEART CATH AND CORONARY/GRAFT ANGIOGRAPHY N/A 12/19/2019   Procedure: RIGHT/LEFT HEART CATH AND CORONARY/GRAFT ANGIOGRAPHY;  Surgeon: Jettie Booze, MD;  Location: [place] CV LAB;  Service: Cardiovascular;  Laterality: N/A;  . SCLEROTHERAPY  12/22/2019   Procedure: SCLEROTHERAPY;  Surgeon: Lavena Bullion, DO;  Location: [facility] ENDOSCOPY;  Service: Gastroenterology;;  . Clide Deutscher  02/13/2021   Procedure: Clide Deutscher;  Surgeon: Jackquline Denmark, MD;  Location: [facility] ENDOSCOPY;  Service: Endoscopy;;    Current Outpatient  Medications  Medication Sig Dispense Refill  . acetaminophen (TYLENOL) 650 MG CR tablet Take 1,300 mg by mouth 3 (three) times daily.    [name] allopurinol (ZYLOPRIM) 100 MG tablet TAKE 1 TABLET(100 MG) BY MOUTH DAILY 90 tablet 1  . ANORO ELLIPTA 62.5-25 MCG/INH AEPB INHALE 1 PUFF BY MOUTH EVERY DAY 60 each 5  . atorvastatin (LIPITOR) 10 MG tablet Take 1 tablet (10 mg total) by mouth daily. 90 tablet 3  . B-D UF III MINI PEN NEEDLES 31G X 5 MM MISC USE FOUR TIMES DAILY 100 each 11  . bictegravir-emtricitabine-tenofovir AF (BIKTARVY) 50-200-25 MG TABS tablet Take 1 tablet by mouth daily. 30 tablet 11  . Calcium Acetate 667 MG TABS Take 667-1,334 mg by mouth See admin instructions. Take 1,334 mg by mouth three times a day with meals and 667 mg with each snack    . carvedilol (COREG) 3.125 MG tablet Take 1 tablet (3.125 mg total) by mouth 2 (two) times daily. 180 tablet 3  . Cinacalcet HCl (SENSIPAR PO) Take 1 tablet by mouth 3 (three) times a week. At dialysis    . colchicine 0.6 MG tablet Take 0.6-1.2 mg by mouth daily as needed (as directed for gout flares).    . diclofenac Sodium (VOLTAREN) 1 % GEL APPLY 2 GRAMS TO THE AFFECTED AREA THREE TIMES DAILY AS NEEDED FOR PAIN 621 g 0  . folic acid (FOLVITE) 1 MG tablet Take 1 tablet (1 mg total) by mouth daily. 90 tablet 1  . gabapentin (NEURONTIN) 300 MG capsule Take 1 capsule (300 mg total) by mouth daily. Dose change due to renal function 90 capsule 1  . glucose blood (ONETOUCH ULTRA) test strip Check blood sugar three times daily. Dx:E11.40 300 strip 3  . insulin lispro (HUMALOG) 100 UNIT/ML KwikPen INJECT 20 UNITS UNDER THE SKIN THREE TIMES DAILY AS NEEDED FOR HIGH BLOOD SUGAR(ABOVE 150) 45 mL 1  . iron sucrose in sodium chloride 0.9 % 100 mL Iron Sucrose (Venofer)    . isosorbide mononitrate (IMDUR) 30 MG 24 hr tablet TAKE 1 TABLET BY MOUTH EVERY DAY 90 tablet 1  . Lancets (ONETOUCH DELICA PLUS [id number]) MISC Inject 1 Device as directed 3 (three)  times daily. Dx: E11.40 300 each 3  . latanoprost (XALATAN) 0.005 % ophthalmic solution Place 1 drop into both eyes at bedtime.    . lidocaine-prilocaine (EMLA) cream Apply 1 application topically every Monday, Wednesday, and Friday with hemodialysis.    [name]  Methoxy PEG-Epoetin Beta (MIRCERA IJ) Mircera    . misoprostol (CYTOTEC) 100 MCG tablet Take 1 tablet (100 mcg total) by mouth every 6 (six) hours. 120 tablet 0  . multivitamin (RENA-VIT) TABS tablet Take 1 tablet by mouth daily.    . nitroGLYCERIN (NITROSTAT) 0.3 MG SL tablet ONE TABLET UNDER TONGUE AS NEEDED FOR CHEST PAIN 25 tablet 4  . pantoprazole (PROTONIX) 40 MG tablet Take 1 tablet (40 mg total) by mouth 2 (two) times daily before a meal. (Patient not taking: Reported on 03/02/2021) 60 tablet 2  . polyethylene glycol (MIRALAX / GLYCOLAX) 17 g packet Take 17 g by mouth daily as needed for mild constipation. 14 each 0  . timolol (TIMOPTIC) 0.5 % ophthalmic solution Place 1 drop into both eyes in the morning.     No current facility-administered medications for this visit.    Allergies as of 03/04/2021 - Review Complete 03/02/2021  Allergen Reaction Noted  . Augmentin [amoxicillin-pot clavulanate] Diarrhea and Other (See Comments) 12/05/2018  . Mucinex fast-max Other (See Comments) 11/30/2020  . Amphetamines Other (See Comments) 01/15/2012    Family History  Problem Relation Age of Onset  . Heart failure Father   . Hypertension Father   . Diabetes Brother   . Heart attack Brother   . Alzheimer's disease Mother   . Stroke Sister   . Diabetes Sister   . Alzheimer's disease Sister   . Hypertension Brother   . Diabetes Brother   . Drug abuse Brother   . Colon cancer Neg Hx     Social History   Socioeconomic History  . Marital status: Single    Spouse name: Not on file  . Number of children: 1  . Years of education: Not on file  . Highest education level: Not on file  Occupational History  . Occupation: retired   Tobacco Use  . Smoking status: Current Every Day Smoker    Packs/day: 0.50    Years: 43.00    Pack years: 21.50    Types: Cigarettes  . Smokeless tobacco: Never Used  Vaping Use  . Vaping Use: Never used  Substance and Sexual Activity  . Alcohol use: Not Currently    Alcohol/week: 12.0 standard drinks    Types: 12 Standard drinks or equivalent per week    Comment: occassional  . Drug use: Not Currently    Types: Cocaine    Comment: hx of crack/cocaine 25yrs ago 10/01/2019- none  . Sexual activity: Yes    Partners: Male  Other Topics Concern  . Not on file  Social History Narrative   Retired.       As of 05/2015:   Diet: No salt    Caffeine   Married: Single    House: Condo, 2 stories, 1 person (self)    Pets: No    Current/Past profession: N/A   Exercise: walks daily    Living Will: Yes    DNR   POA/HPOA: No       Social Determinants of Health   Financial Resource Strain: Low Risk   . Difficulty of Paying Living Expenses: Not hard at all  Food Insecurity: No Food Insecurity  . Worried About Charity fundraiser in the Last Year: Never true  . Ran Out of Food in the Last Year: Never true  Transportation Needs: No Transportation Needs  . Lack of Transportation (Medical): No  . Lack of Transportation (Non-Medical): No  Physical Activity: Not on file  Stress: Not on  file  Social Connections: Not on file  Intimate Partner Violence: Not on file    Review of Systems:    Constitutional: No weight loss, fever or chills Cardiovascular: No chest pain Respiratory: No SOB  Gastrointestinal: See HPI and otherwise negative   Physical Exam:  Vital signs: BP 130/60   Pulse 70   Ht 6\' 2"  (1.88 m)   Wt 170 lb 6 oz (77.3 kg)   BMI 21.87 kg/m   Constitutional:   Pleasant AA male appears to be in NAD, Well developed, Well nourished, alert and cooperative Respiratory: Respirations even and unlabored. Lungs clear to auscultation bilaterally.   No wheezes, crackles, or  rhonchi.  Cardiovascular: Normal S1, S2. No MRG. Regular rate and rhythm. No peripheral edema, cyanosis or pallor.  Gastrointestinal:  Soft, nondistended, nontender. No rebound or guarding. Normal bowel sounds. No appreciable masses or hepatomegaly. Rectal:  Not performed.  Msk:  Symmetrical without gross deformities. Without edema, no deformity or joint abnormality. +ambulates with cane Psychiatric: Oriented to person, place and time. Demonstrates good judgement and reason without abnormal affect or behaviors.  RELEVANT LABS AND IMAGING: CBC    Component Value Date/Time   WBC 6.6 02/25/2021 1317   RBC 3.55 (L) 02/25/2021 1317   HGB 10.4 (L) 02/25/2021 1317   HGB 8.4 (L) 12/17/2019 1042   HCT 33.1 (L) 02/25/2021 1317   HCT 26.0 (L) 12/17/2019 1042   PLT 198 02/25/2021 1317   PLT 145 (L) 12/17/2019 1042   MCV 93.2 02/25/2021 1317   MCV 81 12/17/2019 1042   MCH 29.3 02/25/2021 1317   MCHC 31.4 (L) 02/25/2021 1317   RDW 14.5 02/25/2021 1317   RDW 18.1 (H) 12/17/2019 1042   LYMPHSABS 1,624 02/25/2021 1317   LYMPHSABS 0.9 12/17/2019 1042   MONOABS 1.0 02/14/2021 0613   EOSABS 231 02/25/2021 1317   EOSABS 0.1 12/17/2019 1042   BASOSABS 92 02/25/2021 1317   BASOSABS 0.0 12/17/2019 1042    CMP     Component Value Date/Time   NA 136 02/25/2021 1317   NA 141 02/05/2020 0000   K 4.1 02/25/2021 1317   CL 91 (L) 02/25/2021 1317   CO2 25 02/25/2021 1317   GLUCOSE 119 02/25/2021 1317   BUN 55 (H) 02/25/2021 1317   BUN 65 (A) 02/05/2020 0000   CREATININE 8.0 (H) 02/25/2021 1317   CALCIUM 9.5 02/25/2021 1317   PROT 7.2 02/25/2021 1317   PROT 7.8 08/07/2015 1041   ALBUMIN 2.9 (L) 02/12/2021 0855   ALBUMIN 3.8 08/07/2015 1041   AST 13 02/25/2021 1317   ALT 10 02/25/2021 1317   ALKPHOS 64 02/12/2021 0855   BILITOT 0.4 02/25/2021 1317   BILITOT 0.5 08/07/2015 1041   GFRNONAA 10 (L) 02/16/2021 0630   GFRNONAA 10 (L) 01/12/2021 1139   GFRAA 12 (L) 01/12/2021 1139     Assessment: 1.  History of bleeding AVMs: See HPI, recent hospitalization with gastric and duodenal AVMs treated with APC, hemoglobin now stable no further signs of bleeding  Plan: 1.  Apparently patient had labs done at hemodialysis yesterday.  Will get these to compare hemoglobin. 2.  Told the patient that hopefully he will not have any more bleeds as they have stopped his Brilinta.  I do believe it is okay for him to restart his aspirin.  He wants to wait until Monday.  Explained that there is no way to know for sure if this will affect his future bleeding, but if the bleeding recurs again  then we may need to discuss holding his aspirin as well. 3.  Patient to follow in clinic with [place] as needed in the future.  Ellouise Newer, PA-C [facility] 03/04/2021, 10:34 AM  Cc: [name], NP   Addendum 03/05/2021 2:59 PM  Received labs with a hemoglobin of 10.1.  Iron low at 42.  Transferrin low at 10.  TIBC normal.  Hemoglobin remained stable.  No change in plan.  Ellouise Newer, PA-C

## 2021-03-05 ENCOUNTER — Ambulatory Visit: Payer: Medicare Other | Admitting: Physician Assistant

## 2021-03-05 ENCOUNTER — Telehealth: Payer: Self-pay

## 2021-03-05 DIAGNOSIS — N186 End stage renal disease: Secondary | ICD-10-CM | POA: Diagnosis not present

## 2021-03-05 DIAGNOSIS — D631 Anemia in chronic kidney disease: Secondary | ICD-10-CM | POA: Diagnosis not present

## 2021-03-05 DIAGNOSIS — Z992 Dependence on renal dialysis: Secondary | ICD-10-CM | POA: Diagnosis not present

## 2021-03-05 DIAGNOSIS — N2581 Secondary hyperparathyroidism of renal origin: Secondary | ICD-10-CM | POA: Diagnosis not present

## 2021-03-05 NOTE — Telephone Encounter (Signed)
-----   Message from Yevette Edwards, RN sent at 02/09/2021  4:32 PM EDT ----- Regarding: Urea Breath Test Urea breath test due on or after 5/16

## 2021-03-05 NOTE — Telephone Encounter (Signed)
Urea breath test order and letter were mailed on 02/22/21. Lm on vm for patient to return call to confirm that he has received the order.

## 2021-03-05 NOTE — Telephone Encounter (Signed)
Spoke with patient, he states that he has received the urea breath test order and address to [facility]. Patient states that he will complete the test on Monday. Patient had no concerns at the end of the call.

## 2021-03-08 DIAGNOSIS — N2581 Secondary hyperparathyroidism of renal origin: Secondary | ICD-10-CM | POA: Diagnosis not present

## 2021-03-08 DIAGNOSIS — N186 End stage renal disease: Secondary | ICD-10-CM | POA: Diagnosis not present

## 2021-03-08 DIAGNOSIS — D631 Anemia in chronic kidney disease: Secondary | ICD-10-CM | POA: Diagnosis not present

## 2021-03-08 DIAGNOSIS — A048 Other specified bacterial intestinal infections: Secondary | ICD-10-CM | POA: Diagnosis not present

## 2021-03-08 DIAGNOSIS — Z992 Dependence on renal dialysis: Secondary | ICD-10-CM | POA: Diagnosis not present

## 2021-03-09 ENCOUNTER — Ambulatory Visit (INDEPENDENT_AMBULATORY_CARE_PROVIDER_SITE_OTHER): Payer: Medicare Other | Admitting: Orthopaedic Surgery

## 2021-03-09 ENCOUNTER — Encounter: Payer: Self-pay | Admitting: Orthopaedic Surgery

## 2021-03-09 ENCOUNTER — Other Ambulatory Visit: Payer: Self-pay

## 2021-03-09 VITALS — BP 116/63 | HR 92

## 2021-03-09 DIAGNOSIS — G8929 Other chronic pain: Secondary | ICD-10-CM | POA: Diagnosis not present

## 2021-03-09 DIAGNOSIS — M545 Low back pain, unspecified: Secondary | ICD-10-CM

## 2021-03-09 DIAGNOSIS — Z9889 Other specified postprocedural states: Secondary | ICD-10-CM

## 2021-03-09 NOTE — Progress Notes (Signed)
Office Visit Note   Patient: Jacob [name]           Date of Birth: Nov 29, 1950           MRN: [id number] Visit Date: 03/09/2021              Requested by: [name], NP [place],  [place] [address] PCP: [name], NP   Assessment & Plan: Visit Diagnoses:  1. Chronic bilateral low back pain, unspecified whether sciatica present   2. History of lumbar laminectomy for spinal cord decompression     Plan: Discussed with the patient would recommend he avoid stronger pain medication particular with his past history.  We will proceed with MRI scan with and without contrast to rule out residual compression causing him referred right hip pain down his right leg.  Previous L4-5 decompression removal of intraspinal extradural facet cyst 2017.  Follow-up after scan.  Follow-Up Instructions: No follow-ups on file.   Orders:  Orders Placed This Encounter  Procedures  . MR Lumbar Spine W Wo Contrast   No orders of the defined types were placed in this encounter.     Procedures: No procedures performed   Clinical Data: No additional findings.   Subjective: Chief Complaint  Patient presents with  . Lower Back - Pain, Follow-up    HPI 70 year old male on dialysis Monday Wednesday Friday last seen in December 2021.  He states he was in the hospital with pneumonia problems but did not have COVID.  He is continuing to have back pain and right buttocks pain that radiates down his leg and has to put tiger balm on it daily.  He states the pain is worse after he gets dialyzed.  Just prior to dialysis he states that is when his pain is the least.  He has diabetes A1c last was 4.9 he is on insulin.  Patient states the Ultram did not help him he is wanting something stronger.  Past history of positive cocaine drug screens several years ago. Previous L4-5 decompression with removal of extradural intraspinal facet cyst.  Patient also has ischemic cardiomyopathy.  He  is amatory without any assistive aids. [place] PDMP reviewed.  Patient had previous left intertrochanteric fracture fixed with a short trochanteric nail.  Later fall with questionable greater trochanteric fracture healed and unchanged position.pos. Hx of HIV Review of Systems all the systems noncontributory to HPI.   Objective: Vital Signs: BP 116/63   Pulse 92   Physical Exam Constitutional:      Appearance: He is well-developed.  HENT:     Head: Normocephalic and atraumatic.  Eyes:     Pupils: Pupils are equal, round, and reactive to light.  Neck:     Thyroid: No thyromegaly.     Trachea: No tracheal deviation.  Cardiovascular:     Rate and Rhythm: Normal rate.  Pulmonary:     Effort: Pulmonary effort is normal.     Breath sounds: No wheezing.  Abdominal:     General: Bowel sounds are normal.     Palpations: Abdomen is soft.  Skin:    General: Skin is warm and dry.     Capillary Refill: Capillary refill takes less than 2 seconds.  Neurological:     Mental Status: He is alert and oriented to person, place, and time.  Psychiatric:        Behavior: Behavior normal.        Thought Content: Thought content normal.  Judgment: Judgment normal.     Ortho Exam patient is amatory negative Trendelenburg gait.  He has some tenderness over the trochanter some pain with straight leg raising and sciatic notch tenderness more on the right than left.  Well-healed lumbar incision.  Negative logroll hips right and left.  Specialty Comments:  No specialty comments available.  Imaging: No results found.   PMFS History: Patient Active Problem List   Diagnosis Date Noted  . Acute upper GI bleeding 02/12/2021  . Gastric hemorrhage due to angiodysplasia of stomach   . GI bleed 11/24/2020  . COVID-19 virus infection 11/24/2020  . Other mechanical complication of cranial or spinal infusion catheter, sequela 11/11/2020  . Subdural hematoma ([place]) 11/06/2020  . Fall at home,  initial encounter 11/05/2020  . Nicotine dependence, cigarettes, uncomplicated 24/58/0998  . Alcohol abuse 11/05/2020  . GERD without esophagitis 11/05/2020  . Unspecified trochanteric fracture of left femur, initial encounter for closed fracture ([place]) 11/05/2020  . Traumatic subdural hematoma, initial encounter ([name]) 11/05/2020  . Fracture of metatarsal of right foot, closed 11/05/2020  . Nondisplaced fracture of greater trochanter of left femur, initial encounter for closed fracture ([place])   . Uremia   . Angiodysplasia of intestine with hemorrhage   . Fluid overload 10/16/2020  . Pruritus, unspecified 10/02/2020  . Encounter for removal of sutures 03/05/2020  . Cardiomyopathy ([place]) 12/21/2019  . Iron deficiency anemia   . Acute blood loss anemia   . Acute on chronic anemia   . AVM (arteriovenous malformation) of duodenum, acquired   . Abnormal nuclear stress test 12/19/2019  . Pain, unspecified 10/04/2019  . Type 2 diabetes mellitus with diabetic neuropathy, unspecified ([place]) 08/07/2019  . Allergy, unspecified, initial encounter 08/02/2019  . Complication of vascular dialysis catheter 08/02/2019  . Drug abuse counseling and surveillance of drug abuser 08/02/2019  . Other specified coagulation defects ([place]) 08/02/2019  . Secondary hyperparathyroidism of renal origin ([place]) 08/02/2019  . Unspecified atrial fibrillation ([place]) 08/02/2019  . ESRD on hemodialysis ([place])   . Hand pain, left   . Cocaine abuse ([place])   . Hypertensive emergency   . CHF exacerbation ([place]) 07/21/2019  . Diabetic foot ulcer ([place]) 02/11/2019  . AVM (arteriovenous malformation)   . Melena 02/07/2019  . Symptomatic anemia 02/06/2019  . CKD stage 4 due to type 2 diabetes mellitus ([place]) 05/30/2018  . Hyperlipidemia associated with type 2 diabetes mellitus ([place]) 05/30/2018  . Right foot ulcer ([place]) 02/28/2018  . Chronic obstructive pulmonary disease ([place]) 02/28/2018  . Anemia of chronic disease 11/20/2016  . CKD  (chronic kidney disease) stage 3, GFR 30-59 ml/min (HCC) 11/20/2016  . CHF (congestive heart failure) ([place]) 11/10/2016  . History of lumbar laminectomy for spinal cord decompression 02/29/2016  . Type 2 diabetes mellitus with diabetic polyneuropathy, with long-term current use of insulin ([place]) 06/17/2015  . HTN (hypertension) 04/26/2015  . DM (diabetes mellitus) ([place]) 04/26/2015  . Ischemic cardiomyopathy 05/12/2014  . Ulcer of lower extremity ([place]) 09/06/2013  . Hyperkalemia 09/06/2013  . Chest pain 09/06/2013  . Type II or unspecified type diabetes mellitus with unspecified complication, not stated as uncontrolled 10/22/2012  . Bunion of left foot 11/25/2011  . Bunion, right foot 11/25/2011  . Polysubstance abuse ([place]) 07/28/2011  . Hip fracture, left ([place]) 04/04/2011  . Closed fracture of neck of femur ([place]) 04/04/2011  . Insomnia 02/18/2011  . Coronary artery disease involving native heart without angina pectoris 04/20/2009  . Chronic combined systolic (congestive) and diastolic (congestive) heart  failure ([place]) 04/20/2009  . Mixed hyperlipidemia 11/20/2006  . Gout 11/20/2006  . TOBACCO ABUSE 11/20/2006  . Essential hypertension 11/20/2006  . Human immunodeficiency virus (HIV) disease ([place]) 09/02/2006   Past Medical History:  Diagnosis Date  . Acute respiratory failure ([place]) 03/01/2018  . Anemia   . Arthritis    "all over; mostly knees and back" (02/28/2018)  . Chronic combined systolic and diastolic CHF (congestive heart failure) ([place])   . Chronic lower back pain    stenosis  . Community acquired pneumonia 09/06/2013  . COPD (chronic obstructive pulmonary disease) ([place])   . Coronary atherosclerosis of native coronary artery    a. 02/2003 s/p CABG x 2 (VG->RI, VG->RPDA; b. 11/2019 PCI: LM nl, LAD 90d, D3 50, RI 100, LCX 100p, OM3 100 - fills via L->L collats from D2/dLAD, RCA 100p, VG->RPDA ok, VG->RI 95 (3.5x48 Synergy XD DES).  . Drug abuse ([place])    hx; tested for cocaine as  recently as 2/08. says he is not using drugs now - avoided defib. for this reason   . ESRD (end stage renal disease) ([place])    Hemo M-W-F- Richarda Blade  . Fall at home 10/2020  . GERD (gastroesophageal reflux disease)    takes OTC meds as needed  . GI bleeding    a. 11/2019 EGD: angiodysplastic lesions w/ bleeding s/p argon plasma/clipping/epi inj. Multiple admissions for the same.  . Glaucoma    uses eye drops daily  . Hepatitis B 1968   "tx'd w/isolation; caught it from toilet stools in gym"  . History of blood transfusion 03/01/2019  . History of colon polyps    benign  . History of gout    takes Allopurinol daily as well as Colchicine-if needed (02/28/2018)  . History of kidney stones   . HTN (hypertension)    takes Coreg,Imdur.and Apresoline daily  . Human immunodeficiency virus (HIV) disease ([place]) dx'd 39   on [place] as of 12/2020.    [name] Hyperlipidemia   . Ischemic cardiomyopathy    a. 01/2019 Echo: EF 40-45%, diffuse HK, mild basal septal hypertrophy. Diast dysfxn. Nl RV size/fxn. Sev dil LA. Triv MR/TR/PR.  [name] Muscle spasm    takes Zanaflex as needed  . Myocardial infarction ([place]) ~ 2004/2005  . Nocturia   . Peripheral neuropathy    takes gabapentin daily  . Pneumonia    "at least twice" (02/28/2018)  . SDH (subdural hematoma) ([place])   . Syphilis, unspecified   . Type II diabetes mellitus ([place]) 2004   Lantus daily.Average fasting blood sugar 125-199  . Wears glasses   . Wears partial dentures     Family History  Problem Relation Age of Onset  . Heart failure Father   . Hypertension Father   . Diabetes Brother   . Heart attack Brother   . Alzheimer's disease Mother   . Stroke Sister   . Diabetes Sister   . Alzheimer's disease Sister   . Hypertension Brother   . Diabetes Brother   . Drug abuse Brother   . Colon cancer Neg Hx     Past Surgical History:  Procedure Laterality Date  . AV FISTULA PLACEMENT Left 08/02/2018   Procedure: ARTERIOVENOUS (AV) FISTULA  CREATION  left arm radiocephlic;  Surgeon: Marty Heck, MD;  Location: [facility];  Service: Vascular;  Laterality: Left;  . AV FISTULA PLACEMENT Left 08/01/2019   Procedure: LEFT BRACHIOCEPHALIC ARTERIOVENOUS (AV) FISTULA CREATION;  Surgeon: [name], MD;  Location: [place];  Service: Vascular;  Laterality: Left;  . BASCILIC VEIN TRANSPOSITION Left 10/03/2019   Procedure: BASILIC VEIN TRANSPOSITION LEFT SECOND STAGE;  Surgeon: [name], MD;  Location: [place];  Service: Vascular;  Laterality: Left;  . BIOPSY  01/25/2021   Procedure: BIOPSY;  Surgeon: Doran Stabler, MD;  Location: [facility] ENDOSCOPY;  Service: Gastroenterology;;  . CARDIAC CATHETERIZATION  10/2002; 12/19/2004   [name] 03/08/2011  . COLONOSCOPY  2013   Dr.John Henrene Pastor   . CORONARY ARTERY BYPASS GRAFT  02/24/2003   CABG X2/notes 03/08/2011  . CORONARY STENT INTERVENTION N/A 12/19/2019   Procedure: CORONARY STENT INTERVENTION;  Surgeon: Jettie Booze, MD;  Location: [place] CV LAB;  Service: Cardiovascular;  Laterality: N/A;  . ENTEROSCOPY N/A 01/25/2021   Procedure: ENTEROSCOPY;  Surgeon: Doran Stabler, MD;  Location[place];  Service: Gastroenterology;  Laterality: N/A;  . ENTEROSCOPY N/A 02/13/2021   Procedure: ENTEROSCOPY;  Surgeon: Jackquline Denmark, MD;  Location: [facility]COPY;  Service: Endoscopy;  Laterality: N/A;  . ESOPHAGOGASTRODUODENOSCOPY (EGD) WITH PROPOFOL N/A 02/08/2019   Procedure: ESOPHAGOGASTRODUODENOSCOPY (EGD) WITH PROPOFOL;  Surgeon: Milus Banister, MD;  Location: [place];  Service: Gastroenterology;  Laterality: N/A;  . ESOPHAGOGASTRODUODENOSCOPY (EGD) WITH PROPOFOL N/A 12/22/2019   Procedure: ESOPHAGOGASTRODUODENOSCOPY (EGD) WITH PROPOFOL;  Surgeon: Lavena Bullion, DO;  Location: [place];  Service: Gastroenterology;  Laterality: N/A;  . ESOPHAGOGASTRODUODENOSCOPY (EGD) WITH PROPOFOL N/A 10/19/2020   Procedure: ESOPHAGOGASTRODUODENOSCOPY (EGD) WITH PROPOFOL;  Surgeon: Jackquline Denmark, MD;  Location[name];  Service: Endoscopy;  Laterality: N/A;  . ESOPHAGOGASTRODUODENOSCOPY (EGD) WITH PROPOFOL N/A 12/22/2020   Procedure: ESOPHAGOGASTRODUODENOSCOPY (EGD) WITH PROPOFOL;  Surgeon: Gatha Mayer, MD;  Location: [place];  Service: Endoscopy;  Laterality: N/A;  . ESOPHAGOGASTRODUODENOSCOPY (EGD) WITH PROPOFOL N/A 01/09/2021   Procedure: ESOPHAGOGASTRODUODENOSCOPY (EGD) WITH PROPOFOL;  Surgeon: Irene Shipper, MD;  Location: [facility] ENDOSCOPY;  Service: Endoscopy;  Laterality: N/A;  . HEMOSTASIS CLIP PLACEMENT  12/22/2019   Procedure: HEMOSTASIS CLIP PLACEMENT;  Surgeon: Lavena Bullion, DO;  Location: [place] ENDOSCOPY;  Service: Gastroenterology;;  . HEMOSTASIS CLIP PLACEMENT  12/22/2020   Procedure: HEMOSTASIS CLIP PLACEMENT;  Surgeon: Gatha Mayer, MD;  Location: [facility] ENDOSCOPY;  Service: Endoscopy;;  . HEMOSTASIS CONTROL  12/22/2020   Procedure: HEMOSTASIS CONTROL/hemospray;  Surgeon: Gatha Mayer, MD;  Location: [place];  Service: Endoscopy;;  . HOT HEMOSTASIS N/A 02/08/2019   Procedure: HOT HEMOSTASIS (ARGON PLASMA COAGULATION/BICAP);  Surgeon: Milus Banister, MD;  Location: [facility] ENDOSCOPY;  Service: Gastroenterology;  Laterality: N/A;  . HOT HEMOSTASIS N/A 12/22/2019   Procedure: HOT HEMOSTASIS (ARGON PLASMA COAGULATION/BICAP);  Surgeon: Lavena Bullion, DO;  Location: [facility] ENDOSCOPY;  Service: Gastroenterology;  Laterality: N/A;  . HOT HEMOSTASIS N/A 10/19/2020   Procedure: HOT HEMOSTASIS (ARGON PLASMA COAGULATION/BICAP);  Surgeon: Jackquline Denmark, MD;  Location: [facility] ENDOSCOPY;  Service: Endoscopy;  Laterality: N/A;  . HOT HEMOSTASIS N/A 12/22/2020   Procedure: HOT HEMOSTASIS (ARGON PLASMA COAGULATION/BICAP);  Surgeon: Gatha Mayer, MD;  Location: [facility] ENDOSCOPY;  Service: Endoscopy;  Laterality: N/A;  . HOT HEMOSTASIS N/A 01/09/2021   Procedure: HOT HEMOSTASIS (ARGON PLASMA COAGULATION/BICAP);  Surgeon: Irene Shipper, MD;  Location: [facility] ENDOSCOPY;  Service:  Endoscopy;  Laterality: N/A;  . HOT HEMOSTASIS N/A 01/25/2021   Procedure: HOT HEMOSTASIS (ARGON PLASMA COAGULATION/BICAP);  Surgeon: Doran Stabler, MD;  Location: [place];  Service: Gastroenterology;  Laterality: N/A;  . HOT HEMOSTASIS N/A 02/13/2021   Procedure: HOT HEMOSTASIS (ARGON PLASMA  COAGULATION/BICAP);  Surgeon: Jackquline Denmark, MD;  Location: [facility] ENDOSCOPY;  Service: Endoscopy;  Laterality: N/A;  . INTERTROCHANTERIC HIP FRACTURE SURGERY Left 11/2006   [name] 03/08/2011  . INTRAVASCULAR ULTRASOUND/IVUS N/A 12/19/2019   Procedure: Intravascular Ultrasound/IVUS;  Surgeon: Jettie Booze, MD;  Location: [place] CV LAB;  Service: Cardiovascular;  Laterality: N/A;  . IR FLUORO GUIDE CV LINE RIGHT  07/24/2019  . IR FLUORO GUIDE CV LINE RIGHT  07/30/2019  . IR US GUIDE VASC ACCESS RIGHT  07/24/2019  . IR US GUIDE VASC ACCESS RIGHT  07/30/2019  . LAPAROSCOPIC CHOLECYSTECTOMY  05/2006  . LIGATION OF COMPETING BRANCHES OF ARTERIOVENOUS FISTULA Left 11/05/2018   Procedure: LIGATION OF COMPETING BRANCHES OF ARTERIOVENOUS FISTULA  LEFT  ARM;  Surgeon: Marty Heck, MD;  Location: [place];  Service: Vascular;  Laterality: Left;  . LUMBAR LAMINECTOMY/DECOMPRESSION MICRODISCECTOMY N/A 02/29/2016   Procedure: Left L4-5 Lateral Recess Decompression, Removal Extradural Intraspinal Facet Cyst;  Surgeon: Marybelle Killings, MD;  Location: [place];  Service: Orthopedics;  Laterality: N/A;  . MULTIPLE TOOTH EXTRACTIONS    . ORIF MANDIBULAR FRACTURE Left 08/13/2004   ORIF of left body fracture mandible with KLS Martin 2.3-mm six hole/notes 03/08/2011  . RIGHT/LEFT HEART CATH AND CORONARY/GRAFT ANGIOGRAPHY N/A 12/19/2019   Procedure: RIGHT/LEFT HEART CATH AND CORONARY/GRAFT ANGIOGRAPHY;  Surgeon: Jettie Booze, MD;  Location: [place] CV LAB;  Service: Cardiovascular;  Laterality: N/A;  . SCLEROTHERAPY  12/22/2019   Procedure: SCLEROTHERAPY;  Surgeon: Lavena Bullion, DO;  Location: [facility]  ENDOSCOPY;  Service: Gastroenterology;;  . Clide Deutscher  02/13/2021   Procedure: Clide Deutscher;  Surgeon: Jackquline Denmark, MD;  Location: [facility] ENDOSCOPY;  Service: Endoscopy;;   Social History   Occupational History  . Occupation: retired  Tobacco Use  . Smoking status: Current Every Day Smoker    Packs/day: 0.50    Years: 43.00    Pack years: 21.50    Types: Cigarettes  . Smokeless tobacco: Never Used  Vaping Use  . Vaping Use: Never used  Substance and Sexual Activity  . Alcohol use: Not Currently    Alcohol/week: 12.0 standard drinks    Types: 12 Standard drinks or equivalent per week    Comment: occassional  . Drug use: Not Currently    Types: Cocaine    Comment: hx of crack/cocaine 70yrs ago 10/01/2019- none  . Sexual activity: Yes    Partners: Male

## 2021-03-10 ENCOUNTER — Telehealth: Payer: Self-pay | Admitting: Orthopaedic Surgery

## 2021-03-10 DIAGNOSIS — Z992 Dependence on renal dialysis: Secondary | ICD-10-CM | POA: Diagnosis not present

## 2021-03-10 DIAGNOSIS — N2581 Secondary hyperparathyroidism of renal origin: Secondary | ICD-10-CM | POA: Diagnosis not present

## 2021-03-10 DIAGNOSIS — N186 End stage renal disease: Secondary | ICD-10-CM | POA: Diagnosis not present

## 2021-03-10 DIAGNOSIS — D631 Anemia in chronic kidney disease: Secondary | ICD-10-CM | POA: Diagnosis not present

## 2021-03-10 NOTE — Telephone Encounter (Signed)
[place] with [facility] called stating on the MRI referral it states to do with and without contrast but when the pt got there he let them know he does dialysis. So Randell Patient would like to know if Dr. Lorin Mercy still wants contrast?

## 2021-03-10 NOTE — Telephone Encounter (Signed)
Called and advised that dr. Lorin Mercy is aware pt is on dialysis and still wants contrast

## 2021-03-10 NOTE — Telephone Encounter (Signed)
[phone]  Option 1 followed by option 3

## 2021-03-10 NOTE — Telephone Encounter (Signed)
Wants the CB #?

## 2021-03-11 ENCOUNTER — Telehealth: Payer: Self-pay | Admitting: *Deleted

## 2021-03-11 NOTE — Telephone Encounter (Signed)
Patient called requesting the results from the X-Rays you had ordered. Stated that he has not heard back from anyone yet and was wondering what the results were. Please Advise.

## 2021-03-11 NOTE — Telephone Encounter (Signed)
Patient notified and agreed.  

## 2021-03-11 NOTE — Telephone Encounter (Signed)
I only ordered chest xray for follow up from hospitalization. CXR negative for acute cardiopulmonary disease.

## 2021-03-12 DIAGNOSIS — D631 Anemia in chronic kidney disease: Secondary | ICD-10-CM | POA: Diagnosis not present

## 2021-03-12 DIAGNOSIS — Z992 Dependence on renal dialysis: Secondary | ICD-10-CM | POA: Diagnosis not present

## 2021-03-12 DIAGNOSIS — N2581 Secondary hyperparathyroidism of renal origin: Secondary | ICD-10-CM | POA: Diagnosis not present

## 2021-03-12 DIAGNOSIS — N186 End stage renal disease: Secondary | ICD-10-CM | POA: Diagnosis not present

## 2021-03-15 DIAGNOSIS — D631 Anemia in chronic kidney disease: Secondary | ICD-10-CM | POA: Diagnosis not present

## 2021-03-15 DIAGNOSIS — N2581 Secondary hyperparathyroidism of renal origin: Secondary | ICD-10-CM | POA: Diagnosis not present

## 2021-03-15 DIAGNOSIS — Z992 Dependence on renal dialysis: Secondary | ICD-10-CM | POA: Diagnosis not present

## 2021-03-15 DIAGNOSIS — N186 End stage renal disease: Secondary | ICD-10-CM | POA: Diagnosis not present

## 2021-03-16 ENCOUNTER — Ambulatory Visit (INDEPENDENT_AMBULATORY_CARE_PROVIDER_SITE_OTHER): Payer: Medicare Other | Admitting: Podiatry

## 2021-03-16 ENCOUNTER — Other Ambulatory Visit: Payer: Self-pay

## 2021-03-16 ENCOUNTER — Ambulatory Visit (INDEPENDENT_AMBULATORY_CARE_PROVIDER_SITE_OTHER): Payer: Medicare Other

## 2021-03-16 ENCOUNTER — Other Ambulatory Visit: Payer: Self-pay | Admitting: Podiatry

## 2021-03-16 ENCOUNTER — Encounter: Payer: Self-pay | Admitting: Podiatry

## 2021-03-16 DIAGNOSIS — M79675 Pain in left toe(s): Secondary | ICD-10-CM

## 2021-03-16 DIAGNOSIS — L97511 Non-pressure chronic ulcer of other part of right foot limited to breakdown of skin: Secondary | ICD-10-CM

## 2021-03-16 DIAGNOSIS — E08621 Diabetes mellitus due to underlying condition with foot ulcer: Secondary | ICD-10-CM

## 2021-03-16 DIAGNOSIS — B351 Tinea unguium: Secondary | ICD-10-CM | POA: Diagnosis not present

## 2021-03-16 DIAGNOSIS — L84 Corns and callosities: Secondary | ICD-10-CM

## 2021-03-16 DIAGNOSIS — E0842 Diabetes mellitus due to underlying condition with diabetic polyneuropathy: Secondary | ICD-10-CM | POA: Diagnosis not present

## 2021-03-16 DIAGNOSIS — M79674 Pain in right toe(s): Secondary | ICD-10-CM | POA: Diagnosis not present

## 2021-03-16 MED ORDER — DOXYCYCLINE HYCLATE 100 MG PO CAPS: 100.0000 mg | ORAL_CAPSULE | Freq: Two times a day (BID) | ORAL | 0 refills | Status: AC

## 2021-03-16 NOTE — Patient Instructions (Signed)
DRESSING CHANGES right foot:   PHARMACY SHOPPING LIST: 1. Saline or Wound Cleanser for cleaning wound 2. 2 x 2 inch sterile gauze for cleaning wound 3. IODOSORB GEL  A. WEAR SURGICAL SHOE OR WALKING BOOT AT ALL TIMES.  B. IF PRESCRIBED ORAL ANTIBIOTICS, TAKE ALL MEDICATION AS PRESCRIBED UNTIL ALL ARE GONE.  C. IF DOCTOR HAS DESIGNATED NONWEIGHTBEARING STATUS, PLEASE ADHERE TO INSTRUCTIONS.   1. KEEP RIGHT FOOT DRY AT ALL TIMES!!!!  2. CLEANSE ULCER WITH SALINE OR WOUND CLEANSER.  3. DAB DRY WITH GAUZE SPONGE.  4. APPLY A LIGHT AMOUNT OF Iodosorb Gel TO BASE OF ULCER.  5. APPLY OUTER DRESSING AS INSTRUCTED.  6. WEAR SURGICAL SHOE/BOOT DAILY AT ALL TIMES. IF SUPPLIED, WEAR HEEL PROTECTORS AT ALL TIMES WHEN IN BED.  7. DO NOT WALK BAREFOOT!!!  8.  IF YOU EXPERIENCE ANY FEVER, CHILLS, NIGHTSWEATS, NAUSEA OR VOMITING, ELEVATED OR LOW BLOOD SUGARS, REPORT TO EMERGENCY ROOM.  9. IF YOU EXPERIENCE INCREASED REDNESS, PAIN, SWELLING, DISCOLORATION, ODOR, PUS, DRAINAGE OR WARMTH OF YOUR FOOT, REPORT TO EMERGENCY ROOM.

## 2021-03-16 NOTE — Progress Notes (Addendum)
Subjective:  [name], male    DOB: 09-Feb-1951,  MRN: [id number]  70 y.o. male presents with at risk foot care with history of diabetic neuropathy.    Patient's blood sugar was 199 mg/dl this morning at 11 am.  He states his right foot is tender today. Denies any redness or drainage. States foot still swells at times since he sustained his right foot fracture. He denies any fever, chills, night sweats, nausea, vomiting or malaise.  He continues his dialysis treatments on MWF.  PCP: [name], NP and last visit was: 02/04/2021.  Review of Systems: Negative except as noted in the HPI.   Allergies  Allergen Reactions  . Augmentin [Amoxicillin-Pot Clavulanate] Diarrhea and Other (See Comments)    Severe diarrhea  . Mucinex Fast-Max Other (See Comments)    Intense sweating   . Amphetamines Other (See Comments)    Unknown reaction   Objective:  There were no vitals filed for this visit. Constitutional Patient is a pleasant 70 y.o. African American male WD, WN in NAD. AAO x 3.  Vascular Capillary fill time to digits <3 seconds b/l lower extremities. Palpable pedal pulses b/l LE. Pedal hair absent. Lower extremity skin temperature gradient within normal limits. No pain with calf compression b/l. No cyanosis or clubbing noted.  Neurologic Normal speech. Pt has subjective symptoms of neuropathy. Protective sensation diminished with 10g monofilament b/l. Vibratory sensation diminished b/l.  Dermatologic Pedal skin with normal turgor, texture and tone bilaterally.No interdigital macerations bilaterally. Toenails 1-5 b/l elongated, discolored, dystrophic, thickened, crumbly with subungual debris and tenderness to dorsal palpation. Porokeratotic lesion(s) L hallux. No erythema, no edema, no drainage, no fluctuance.   Wound Location: submet head 4 right foot There is a moderate amount of devitalized tissue present in the wound. Predebridement Wound Measurement:  2.0  x  2.0 x cm with tenderness to palpation. Postdebridement Wound Measurement: 1.5 x 1.5 x 0.3 cm. Wound Base: postdebridement, fibrogranular Peri-wound: Normal Exudate: Scant/small amount serous exudate <1/2 cc Blood Loss during debridement: 0 cc('s). Material in wound which inhibits healing/promotes adjacent tissue breakdown:  necrotic tissue. Description of tissue removed from ulceration today:  necrotic tissue. Sign(s) of clinical bacterial infection: pain on palpation, serous exudate  Orthopedic: Normal muscle strength 5/5 to all lower extremity muscle groups bilaterally. No pain crepitus or joint limitation noted with ROM b/l. Hallux valgus with bunion deformity noted b/l lower extremities. Hammertoe(s) noted to the 2-5 bilaterally. Utilizes cane for ambulation assistance.   Hemoglobin A1C Latest Ref Rng & Units 02/25/2021 12/15/2020 11/05/2020  HGBA1C <5.7 % of total Hgb 4.9 4.6 4.9  Some recent data might be hidden   Xray findings Right foot: No gas in tissues right foot. Vessel calcifications noted right foot. Fracture noted 5th metatarsal head right foot, oblique from head through proximal shaft with mild medial displacement. No bone erosion noted at location of ulceration submet head 4 right foot.   Assessment:   1. Pain due to onychomycosis of toenails of both feet   2. Diabetic ulcer of other part of right foot associated with diabetes mellitus due to underlying condition, limited to breakdown of skin ([place])   3. Pre-ulcerative calluses   4. Diabetes mellitus due to underlying condition with diabetic polyneuropathy, unspecified whether long term insulin use ([place])    Plan:  Patient was evaluated and treated and all questions answered.  Onychomycosis with pain -Nails palliatively debridement as below. -Educated on self-care  Procedure: Nail Debridement Rationale: Pain  Type of Debridement: manual, sharp debridement. Instrumentation: Nail nipper, rotary burr. Number of Nails:  10   -Patient was evaluated and treated and all questions answered.  -Patient/POA/Family member educated on diagnosis and treatment plan of routine ulcer debridement/wound care.  Written wound care instructions given to patient on today's visit. He is to resume his offloaded Darco shoe for his right foot. -Ulceration debridement achieved utilizing sharp excisional debridement with sterile tissue nipper and sterile scalpel blade. Type/amount of devitalized tissue removed: necrotic tissue -Today's ulcer size post-debridement: 1.5 x 1.5 x 0.3 cm. -Ulceration cleansed with wound cleanser. Iodosorb Gel applied to base of ulceration and secured with light dressing. -Wound responded well to today's debridement. -Patient risk factors affecting healing of ulcer: diabetes, foot deformity, neuropathy, ESRD on hemodialysis, recurrence -[name] given written instructions on daily wound care for submet head 4 right foot ulceration. -Rx for Doxycyline 100 mg, #20, to be taken twice daily for 10 days. -Frequency of debridements needed to achieve healing: weekly to biweekly -Wound culture and sensitivity ordered today for right foot diabetic ulcer. -Radiology ordered today: X-Ray: right foot. Phoned patient with xray findings. -Toenails 1-5 b/l were debrided in length and girth with sterile nail nippers and dremel without iatrogenic bleeding.  -Painful porokeratotic lesion(s) L hallux pared and enucleated with sterile scalpel blade without incident. Total number of lesions debrided=1. -Patient to report any pedal injuries to medical professional immediately. -Patient/POA to call should there be question/concern in the interim. -He will follow up with Dr. Lanae Crumbly for ulcer submet head 4 right foot.  Return in about 1 week (around 03/23/2021).  Marzetta Board, DPM

## 2021-03-17 DIAGNOSIS — N186 End stage renal disease: Secondary | ICD-10-CM | POA: Diagnosis not present

## 2021-03-17 DIAGNOSIS — N2581 Secondary hyperparathyroidism of renal origin: Secondary | ICD-10-CM | POA: Diagnosis not present

## 2021-03-17 DIAGNOSIS — D631 Anemia in chronic kidney disease: Secondary | ICD-10-CM | POA: Diagnosis not present

## 2021-03-17 DIAGNOSIS — Z992 Dependence on renal dialysis: Secondary | ICD-10-CM | POA: Diagnosis not present

## 2021-03-18 ENCOUNTER — Telehealth: Payer: Self-pay

## 2021-03-18 ENCOUNTER — Telehealth: Payer: Self-pay | Admitting: Physician Assistant

## 2021-03-18 DIAGNOSIS — A048 Other specified bacterial intestinal infections: Secondary | ICD-10-CM

## 2021-03-18 NOTE — Telephone Encounter (Signed)
Spoke with patient in regards to results and recommendations. Patient is aware that ID will contact him directly to set up an appt in regards to management of H. Pylori. Patient verbalized understanding and had no concerns at the end of the call.

## 2021-03-18 NOTE — Telephone Encounter (Signed)
I saw this patient first in 2007 for an ERCP however he recently has had 2 office visits with Dr. Henrene Pastor, in 2020 and 2021, so Dr. Henrene Pastor has assumed his [facility] care. I co-signed Ellouise Newer, PA-C 02/2021 office note by mistake without noting that the patient had changed to Dr. Henrene Pastor.   As Dr. Henrene Pastor is out of the office this week I will recommend mgmt until Dr. Henrene Pastor returns next week.   The patient was provided this H pylori treatment regimen by Dr. Loletha Carrow:  Metronidazole 250 mg four times daily x 10 days;  Disp #40 tablets, RF zero Bismuth subsalicylate 182 mg four times daily x 10 days;  Disp #40 tablets, RF zero Doxycycline 100 mg twice daily x 10 days;  Disp# 20 tablets, RF zero Omeprazole 20 mg twice daily x 10 days (available over the counter)  Follow up H pylori urea breath test is positive  Recommend:  Talicia 4 capsules tid with food x 14 days If he is not able to obtain due to cost or another reason then refer to ID for mgmt of his H pylori Further follow and mgmt per Dr. Henrene Pastor

## 2021-03-18 NOTE — Telephone Encounter (Signed)
Called patient, he is asking if his Phillips Odor is causing his H. Pylori infection. RN assured him that his Phillips Odor is not causing his infection and is in fact prescribed in order to keep his immune system healthy. Patient verbalized understanding and has no further questions.   Beryle Flock, RN

## 2021-03-18 NOTE — Telephone Encounter (Signed)
The patient will need retreatment for the H. pylori, however this is a patient of Dr. Fuller Plan.  I performed the upper endoscopy while the patient was admitted and prescribed the H. pylori treatment after those pathology results while Dr. Fuller Plan was out of the office.  I have forwarded this to Dr. Fuller Plan for attention.  - HD

## 2021-03-18 NOTE — Telephone Encounter (Signed)
-----   Message from [facility] sent at 03/18/2021  1:40 PM EDT ----- Patient is a Dr. Hale Bogus B20 patient and was referred by [facility] for H. pylori infection. I have scheduled him for 04/06/21 with Dr.  Salon due to his dialysis he can only come in tuesdays or thursdays. He did want to ask about continuing his Phillips Odor, best contact number is [phone] or [phone]

## 2021-03-18 NOTE — Telephone Encounter (Signed)
Dr. Loletha Carrow, a copy of results are in your IN box. Urea breath test was "detected." Please advise, thanks.

## 2021-03-18 NOTE — Telephone Encounter (Signed)
As per my prior note please refer to ID for further mgmt.

## 2021-03-18 NOTE — Telephone Encounter (Signed)
Inbound call from patient requesting his results for the urea breath test please.

## 2021-03-19 ENCOUNTER — Telehealth: Payer: Self-pay | Admitting: Podiatry

## 2021-03-19 ENCOUNTER — Telehealth (INDEPENDENT_AMBULATORY_CARE_PROVIDER_SITE_OTHER): Payer: Medicare Other | Admitting: Podiatry

## 2021-03-19 DIAGNOSIS — D631 Anemia in chronic kidney disease: Secondary | ICD-10-CM | POA: Diagnosis not present

## 2021-03-19 DIAGNOSIS — N2581 Secondary hyperparathyroidism of renal origin: Secondary | ICD-10-CM | POA: Diagnosis not present

## 2021-03-19 DIAGNOSIS — N186 End stage renal disease: Secondary | ICD-10-CM | POA: Diagnosis not present

## 2021-03-19 DIAGNOSIS — Z992 Dependence on renal dialysis: Secondary | ICD-10-CM | POA: Diagnosis not present

## 2021-03-19 MED ORDER — SODIUM CHLORIDE 0.9 % IR SOLN: 1 refills | Status: DC

## 2021-03-19 NOTE — Telephone Encounter (Signed)
Patient was called. Thanks Doren Custard!

## 2021-03-19 NOTE — Telephone Encounter (Signed)
Called patient back. He states he was having right hip pain which radiates down to his foot. States he called  Dr. Lorin Mercy' office for pain medication becauseTramadol was not working. He cannot get more pain medication until after his MRI is done on Mar 23, 2021.   He has taken two tylenol and is elevating his foot. He also states he could not find wound cleanser at home and has been using hydrogen peroxide to clean his right foot ulcer. I advised him to discontinue this and I would call Rx for normal saline for him to clean the right foot ulcer.  Patient also states right Darco shoe is too tight and he has started wearing the boot on his right foot.  He is scheduled to see Dr. Sherryle Lis on 03/25/2021 for follow up of diabetic foot ulcer. He is on doxycycline 100 mg po bid.   He was advised to report to ED if he is unable to tolerate pain. He has written instructions for wound care of right foot.

## 2021-03-19 NOTE — Telephone Encounter (Signed)
Patient stated he was in the other day for removal of painful callus. Still experiencing pain and wanted a message sent over to [name] for pain medication, Also wanted to know if he should put boot on every time he needs to walk, Please Advise

## 2021-03-20 LAB — WOUND CULTURE
MICRO NUMBER:: 11928356
RESULT:: NO GROWTH
SPECIMEN QUALITY:: ADEQUATE

## 2021-03-20 LAB — HOUSE ACCOUNT TRACKING

## 2021-03-22 DIAGNOSIS — N2581 Secondary hyperparathyroidism of renal origin: Secondary | ICD-10-CM | POA: Diagnosis not present

## 2021-03-22 DIAGNOSIS — N186 End stage renal disease: Secondary | ICD-10-CM | POA: Diagnosis not present

## 2021-03-22 DIAGNOSIS — Z992 Dependence on renal dialysis: Secondary | ICD-10-CM | POA: Diagnosis not present

## 2021-03-23 ENCOUNTER — Ambulatory Visit
Admission: RE | Admit: 2021-03-23 | Discharge: 2021-03-23 | Disposition: A | Discharge status: Home / Self Care | Payer: Medicare Other | Source: Ambulatory Visit | Attending: Orthopaedic Surgery | Admitting: Orthopaedic Surgery

## 2021-03-23 ENCOUNTER — Other Ambulatory Visit: Payer: Self-pay

## 2021-03-23 DIAGNOSIS — N186 End stage renal disease: Secondary | ICD-10-CM | POA: Diagnosis not present

## 2021-03-23 DIAGNOSIS — M545 Low back pain, unspecified: Secondary | ICD-10-CM

## 2021-03-23 DIAGNOSIS — I129 Hypertensive chronic kidney disease with stage 1 through stage 4 chronic kidney disease, or unspecified chronic kidney disease: Secondary | ICD-10-CM | POA: Diagnosis not present

## 2021-03-23 DIAGNOSIS — M4807 Spinal stenosis, lumbosacral region: Secondary | ICD-10-CM | POA: Diagnosis not present

## 2021-03-23 DIAGNOSIS — M5127 Other intervertebral disc displacement, lumbosacral region: Secondary | ICD-10-CM | POA: Diagnosis not present

## 2021-03-23 DIAGNOSIS — Z992 Dependence on renal dialysis: Secondary | ICD-10-CM | POA: Diagnosis not present

## 2021-03-23 DIAGNOSIS — G8929 Other chronic pain: Secondary | ICD-10-CM

## 2021-03-23 MED ORDER — GADOBUTROL 1 MMOL/ML IV SOLN
7.5000 mL | Freq: Once | INTRAVENOUS | Status: AC | PRN
  Administered 2021-03-23: 7.5 mL via INTRAVENOUS

## 2021-03-23 NOTE — Progress Notes (Signed)
Thanks

## 2021-03-24 DIAGNOSIS — N186 End stage renal disease: Secondary | ICD-10-CM | POA: Diagnosis not present

## 2021-03-24 DIAGNOSIS — Z992 Dependence on renal dialysis: Secondary | ICD-10-CM | POA: Diagnosis not present

## 2021-03-24 DIAGNOSIS — D631 Anemia in chronic kidney disease: Secondary | ICD-10-CM | POA: Diagnosis not present

## 2021-03-24 DIAGNOSIS — N2581 Secondary hyperparathyroidism of renal origin: Secondary | ICD-10-CM | POA: Diagnosis not present

## 2021-03-25 ENCOUNTER — Other Ambulatory Visit: Payer: Self-pay

## 2021-03-25 ENCOUNTER — Encounter: Payer: Self-pay | Admitting: Podiatry

## 2021-03-25 ENCOUNTER — Ambulatory Visit (INDEPENDENT_AMBULATORY_CARE_PROVIDER_SITE_OTHER): Payer: Medicare Other | Admitting: Podiatry

## 2021-03-25 DIAGNOSIS — S92351G Displaced fracture of fifth metatarsal bone, right foot, subsequent encounter for fracture with delayed healing: Secondary | ICD-10-CM | POA: Diagnosis not present

## 2021-03-25 DIAGNOSIS — L84 Corns and callosities: Secondary | ICD-10-CM | POA: Diagnosis not present

## 2021-03-26 DIAGNOSIS — Z992 Dependence on renal dialysis: Secondary | ICD-10-CM | POA: Diagnosis not present

## 2021-03-26 DIAGNOSIS — N186 End stage renal disease: Secondary | ICD-10-CM | POA: Diagnosis not present

## 2021-03-26 DIAGNOSIS — D631 Anemia in chronic kidney disease: Secondary | ICD-10-CM | POA: Diagnosis not present

## 2021-03-26 DIAGNOSIS — N2581 Secondary hyperparathyroidism of renal origin: Secondary | ICD-10-CM | POA: Diagnosis not present

## 2021-03-29 ENCOUNTER — Telehealth: Payer: Self-pay | Admitting: *Deleted

## 2021-03-29 DIAGNOSIS — N186 End stage renal disease: Secondary | ICD-10-CM | POA: Diagnosis not present

## 2021-03-29 DIAGNOSIS — D631 Anemia in chronic kidney disease: Secondary | ICD-10-CM | POA: Diagnosis not present

## 2021-03-29 DIAGNOSIS — N2581 Secondary hyperparathyroidism of renal origin: Secondary | ICD-10-CM | POA: Diagnosis not present

## 2021-03-29 DIAGNOSIS — Z992 Dependence on renal dialysis: Secondary | ICD-10-CM | POA: Diagnosis not present

## 2021-03-29 NOTE — Telephone Encounter (Signed)
Patient called and wanted to know if you wanted him to Continue Misoprostol 100mg  One every 6 hours. Was given in the hospital.  Patient stated that she only has 6 tablets left.  Please Advise.

## 2021-03-29 NOTE — Telephone Encounter (Signed)
Would have him follow up with GI doctors in regards to this.

## 2021-03-29 NOTE — Telephone Encounter (Signed)
Patient notified and agreed.  

## 2021-03-29 NOTE — Progress Notes (Signed)
  Subjective:  [name], male    DOB: 09/16/1951,  MRN: [id number]   70 y.o. male returns for follow-up of right foot fracture and large painful callus  Objective:  Physical Exam: warm, good capillary refill, no trophic changes or ulcerative lesions and normal DP and PT pulses.  He has no pain and edema around the distal fifth metatarsal.  There is a large preulcerative callus submet 5 on the right foot  Radiographs: Previous x-rays reviewed, there is bridging across the fracture site Assessment:   1. Pre-ulcerative calluses   2. Closed non-physeal fracture of fifth metatarsal bone of right foot with delayed healing, subsequent encounter      Plan:  Patient was evaluated and treated and all questions answered.   -All symptomatic hyperkeratoses were safely debrided with a sterile #15 blade to patient's level of comfort without incident. We discussed preventative and palliative care of these lesions including supportive and accommodative shoegear, padding, prefabricated and custom molded accommodative orthoses, use of a pumice stone and lotions/creams daily.  I think the pain he is having is mostly right lower extremity is due to his hip issues he will see his orthopedist for this  Return if symptoms worsen or fail to improve.

## 2021-03-30 ENCOUNTER — Ambulatory Visit (INDEPENDENT_AMBULATORY_CARE_PROVIDER_SITE_OTHER): Payer: Medicare Other | Admitting: Orthopaedic Surgery

## 2021-03-30 ENCOUNTER — Encounter: Payer: Self-pay | Admitting: Orthopaedic Surgery

## 2021-03-30 ENCOUNTER — Telehealth: Payer: Self-pay | Admitting: Orthopaedic Surgery

## 2021-03-30 DIAGNOSIS — M48061 Spinal stenosis, lumbar region without neurogenic claudication: Secondary | ICD-10-CM | POA: Diagnosis not present

## 2021-03-30 MED ORDER — OXYCODONE-ACETAMINOPHEN 5-325 MG PO TABS: 1.0000 | ORAL_TABLET | Freq: Two times a day (BID) | ORAL | 0 refills | Status: DC

## 2021-03-30 NOTE — Progress Notes (Signed)
Office Visit Note   Patient: Jacob [name]           Date of Birth: Mar 24, 1951           MRN: [id number] Visit Date: 03/30/2021              Requested by: [name], NP [place],  [place] [id number] PCP: [name], NP   Assessment & Plan: Visit Diagnoses:  1. Lumbar foraminal stenosis           With L4-5 degenerative anterolisthesis.  Plan: Patient has anterolisthesis at L4-5 which is new progressed from 3 to 5 mm with lateral recess stenosis and some foraminal stenosis.  He is having more right than left side symptoms I recommend a single epidural steroid injection.  He is currently seeing his internist for low-grade infection in his lung that has been treated with antibiotics.  We discussed normal treatment options when patients have some degrees of foraminal and central stenosis with anterolisthesis however with past history of hip fractures and poor bone quality from chronic dialysis he is not really a good candidate for instrumented fusion due to problems with hardware failure and screw pullout from the bone risks.  We discussed this in detail.  20 tablets Percocet sent and he can take 1 twice a day if needed for pain.  He states he skips it the day of his dialysis.  We will set him up for single epidural injection likely at the L4-5 level and he can follow-up with me in 6 weeks.   Follow-Up Instructions: Return in about 6 weeks (around 05/11/2021).   Orders:  No orders of the defined types were placed in this encounter.  Meds ordered this encounter  Medications  . oxyCODONE-acetaminophen (PERCOCET/ROXICET) 5-325 MG tablet    Sig: Take 1 tablet by mouth 2 (two) times daily.    Dispense:  20 tablet    Refill:  0      Procedures: No procedures performed   Clinical Data: No additional findings.   Subjective: Chief Complaint  Patient presents with  . Lower Back - Pain, Follow-up    MRI review    HPI 70 year old male returns with ongoing  problems with severe back pain.  Previous surgery with removal of a left side facet cyst at L4-5 2017 surgery by me.  He is a new MRI scan states she needs something for pain.  He has had past history of substance abuse.  PDMP reviewed.  Previous history of fractures chronic dialysis.  Diabetes and he just takes insulin if sugar goes above 150.  Patient has more radicular symptoms in his right leg and left leg.  He has a callus has been buffing and trimming on and has no active plantar foot lesions currently.  Review of Systems all the systems updated unchanged from previous visit.   Objective: Vital Signs: BP (!) 139/57   Pulse 97   Ht 6\' 2"  (1.88 m)   Wt 172 lb (78 kg)   BMI 22.08 kg/m   Physical Exam Constitutional:      Appearance: He is well-developed.  HENT:     Head: Normocephalic and atraumatic.  Eyes:     Pupils: Pupils are equal, round, and reactive to light.  Neck:     Thyroid: No thyromegaly.     Trachea: No tracheal deviation.  Cardiovascular:     Rate and Rhythm: Normal rate.  Pulmonary:     Effort: Pulmonary effort  is normal.     Breath sounds: No wheezing.  Abdominal:     General: Bowel sounds are normal.     Palpations: Abdomen is soft.  Skin:    General: Skin is warm and dry.     Capillary Refill: Capillary refill takes less than 2 seconds.  Neurological:     Mental Status: He is alert and oriented to person, place, and time.  Psychiatric:        Behavior: Behavior normal.        Thought Content: Thought content normal.        Judgment: Judgment normal.     Ortho Exam patient has some pain with straight leg raising at 90 degrees right and left.  Anterior tib EHL is active.  No plantar foot lesions.  Specialty Comments:  No specialty comments available.  Imaging: CLINICAL DATA:  Dialysis patient.  Low back pain.  EXAM: MRI LUMBAR SPINE WITHOUT AND WITH CONTRAST  TECHNIQUE: Multiplanar and multiecho pulse sequences of the lumbar spine  were obtained without and with intravenous contrast.  CONTRAST:  7.39mL GADAVIST GADOBUTROL 1 MMOL/ML IV SOLN  COMPARISON:  Radiography 09/08/2020.  MRI 12/29/2015  FINDINGS: Segmentation: 5 lumbar type vertebral bodies as numbered previously.  Alignment: 5 mm degenerative anterolisthesis at L4-5, increased a mm or 2 since 2017.  Vertebrae: Discogenic endplate marrow changes at L4-5 and L5-S1 with mild edema and enhancement could contribute to low back pain.  Conus medullaris and cauda equina: Conus extends to the L1 level. Conus and cauda equina appear normal.  Paraspinal and other soft tissues: Small renal cysts. No acute finding.  Disc levels:  T11-12 and T12-L1: Normal  L1-2 and L2-3: Minimal disc bulges.  No stenosis.  L3-4: Minimal disc bulge. Mild facet and ligamentous hypertrophy. Mild narrowing of the lateral recesses but no apparent neural compression. The facet arthritis could contribute to low back pain. The facet arthritis has worsened slightly since 2017.  L4-5: Bilateral facet degeneration with facet and ligamentous hypertrophy. 5 mm of anterolisthesis, increased from about 3 mm in 2017. Worsened disc degeneration with broad-based herniation of the disc. Multifactorial spinal stenosis at this level that could cause neural compression on either or both sides. Bilateral foraminal stenosis could affect the exiting L4 nerves as well. Synovial cyst previously seen associated with the facet on the left is not clearly recognizable.  L5-S1: Disc degeneration with loss of disc height. Endplate osteophytes and shallow protrusion of the disc. Facet degeneration and hypertrophy. Stenosis of both subarticular lateral recesses and neural foramina could cause neural compression on either or both sides. Findings have worsened since the previous exam.  IMPRESSION: L3-4: Mild bulging of the disc. Facet degeneration and hypertrophy which is progressive since  2017. No sign of neural compressive stenosis. The facet arthritis could contribute to low back pain.  L4-5: Worsening of disease at this level. Bilateral facet degeneration and hypertrophy with anterolisthesis of 5 mm, increased from 3 mm previously. Broad-based herniation of the disc. Stenosis of the canal, lateral recesses and neural foramina could cause neural compression on either or both sides. Discogenic endplate marrow changes could relate to low back pain. The facet arthropathy could relate to low back pain.  L5-S1: Worsening of disease at this level. Disc degeneration with loss of disc height, endplate osteophytes and shallow protrusion of the disc. Facet degeneration and hypertrophy. Stenosis of the subarticular lateral recesses and neural foramina that could cause neural compression on either or both sides. Endplate edematous changes could  relate to low back pain.   Electronically Signed   By: Nelson Chimes M.D.   On: 03/23/2021 14:41   PMFS History: Patient Active Problem List   Diagnosis Date Noted  . Lumbar foraminal stenosis 03/30/2021  . Acute upper GI bleeding 02/12/2021  . Gastric hemorrhage due to angiodysplasia of stomach   . GI bleed 11/24/2020  . COVID-19 virus infection 11/24/2020  . Other mechanical complication of cranial or spinal infusion catheter, sequela 11/11/2020  . Subdural hematoma ([place]) 11/06/2020  . Fall at home, initial encounter 11/05/2020  . Nicotine dependence, cigarettes, uncomplicated 04/16/7627  . Alcohol abuse 11/05/2020  . GERD without esophagitis 11/05/2020  . Unspecified trochanteric fracture of left femur, initial encounter for closed fracture ([place]) 11/05/2020  . Traumatic subdural hematoma, initial encounter () 11/05/2020  . Fracture of metatarsal of right foot, closed 11/05/2020  . Nondisplaced fracture of greater trochanter of left femur, initial encounter for closed fracture ([place])   . Uremia   . Angiodysplasia of  intestine with hemorrhage   . Fluid overload 10/16/2020  . Pruritus, unspecified 10/02/2020  . Encounter for removal of sutures 03/05/2020  . Cardiomyopathy ([place]) 12/21/2019  . Iron deficiency anemia   . Acute blood loss anemia   . Acute on chronic anemia   . AVM (arteriovenous malformation) of duodenum, acquired   . Abnormal nuclear stress test 12/19/2019  . Pain, unspecified 10/04/2019  . Type 2 diabetes mellitus with diabetic neuropathy, unspecified ([place]) 08/07/2019  . Allergy, unspecified, initial encounter 08/02/2019  . Complication of vascular dialysis catheter 08/02/2019  . Drug abuse counseling and surveillance of drug abuser 08/02/2019  . Other specified coagulation defects ([place]) 08/02/2019  . Secondary hyperparathyroidism of renal origin ([place]) 08/02/2019  . Unspecified atrial fibrillation ([place]) 08/02/2019  . ESRD on hemodialysis ([place])   . Hand pain, left   . Cocaine abuse ([place])   . Hypertensive emergency   . CHF exacerbation ([place]) 07/21/2019  . Diabetic foot ulcer ([place]) 02/11/2019  . AVM (arteriovenous malformation)   . Melena 02/07/2019  . Symptomatic anemia 02/06/2019  . CKD stage 4 due to type 2 diabetes mellitus ([place]) 05/30/2018  . Hyperlipidemia associated with type 2 diabetes mellitus ([place]) 05/30/2018  . Right foot ulcer ([place]) 02/28/2018  . Chronic obstructive pulmonary disease ([place]) 02/28/2018  . Anemia of chronic disease 11/20/2016  . CKD (chronic kidney disease) stage 3, GFR 30-59 ml/min (HCC) 11/20/2016  . CHF (congestive heart failure) ([place]) 11/10/2016  . History of lumbar laminectomy for spinal cord decompression 02/29/2016  . Type 2 diabetes mellitus with diabetic polyneuropathy, with long-term current use of insulin ([place]) 06/17/2015  . HTN (hypertension) 04/26/2015  . DM (diabetes mellitus) ([place]) 04/26/2015  . Ischemic cardiomyopathy 05/12/2014  . Ulcer of lower extremity ([place]) 09/06/2013  . Hyperkalemia 09/06/2013  . Chest pain 09/06/2013  . Type II  or unspecified type diabetes mellitus with unspecified complication, not stated as uncontrolled 10/22/2012  . Bunion of left foot 11/25/2011  . Bunion, right foot 11/25/2011  . Polysubstance abuse ([place]) 07/28/2011  . Hip fracture, left ([place]) 04/04/2011  . Closed fracture of neck of femur ([place]) 04/04/2011  . Insomnia 02/18/2011  . Coronary artery disease involving native heart without angina pectoris 04/20/2009  . Chronic combined systolic (congestive) and diastolic (congestive) heart failure ([place]) 04/20/2009  . Mixed hyperlipidemia 11/20/2006  . Gout 11/20/2006  . TOBACCO ABUSE 11/20/2006  . Essential hypertension 11/20/2006  . Human immunodeficiency virus (HIV) disease ([place]) 09/02/2006   Past Medical History:  Diagnosis Date  . Acute respiratory failure ([place]) 03/01/2018  . Anemia   . Arthritis    "all over; mostly knees and back" (02/28/2018)  . Chronic combined systolic and diastolic CHF (congestive heart failure) ([place])   . Chronic lower back pain    stenosis  . Community acquired pneumonia 09/06/2013  . COPD (chronic obstructive pulmonary disease) ([place])   . Coronary atherosclerosis of native coronary artery    a. 02/2003 s/p CABG x 2 (VG->RI, VG->RPDA; b. 11/2019 PCI: LM nl, LAD 90d, D3 50, RI 100, LCX 100p, OM3 100 - fills via L->L collats from D2/dLAD, RCA 100p, VG->RPDA ok, VG->RI 95 (3.5x48 Synergy XD DES).  . Drug abuse ([place])    hx; tested for cocaine as recently as 2/08. says he is not using drugs now - avoided defib. for this reason   . ESRD (end stage renal disease) ([place])    Hemo M-W-F- Richarda Blade  . Fall at home 10/2020  . GERD (gastroesophageal reflux disease)    takes OTC meds as needed  . GI bleeding    a. 11/2019 EGD: angiodysplastic lesions w/ bleeding s/p argon plasma/clipping/epi inj. Multiple admissions for the same.  . Glaucoma    uses eye drops daily  . Hepatitis B 1968   "tx'd w/isolation; caught it from toilet stools in gym"  . History of blood transfusion  03/01/2019  . History of colon polyps    benign  . History of gout    takes Allopurinol daily as well as Colchicine-if needed (02/28/2018)  . History of kidney stones   . HTN (hypertension)    takes Coreg,Imdur.and Apresoline daily  . Human immunodeficiency virus (HIV) disease ([place]) dx'd 27   on [place] as of 12/2020.    [name] Hyperlipidemia   . Ischemic cardiomyopathy    a. 01/2019 Echo: EF 40-45%, diffuse HK, mild basal septal hypertrophy. Diast dysfxn. Nl RV size/fxn. Sev dil LA. Triv MR/TR/PR.  [name] Muscle spasm    takes Zanaflex as needed  . Myocardial infarction ([place]) ~ 2004/2005  . Nocturia   . Peripheral neuropathy    takes gabapentin daily  . Pneumonia    "at least twice" (02/28/2018)  . SDH (subdural hematoma) ([place])   . Syphilis, unspecified   . Type II diabetes mellitus ([place]) 2004   Lantus daily.Average fasting blood sugar 125-199  . Wears glasses   . Wears partial dentures     Family History  Problem Relation Age of Onset  . Heart failure Father   . Hypertension Father   . Diabetes Brother   . Heart attack Brother   . Alzheimer's disease Mother   . Stroke Sister   . Diabetes Sister   . Alzheimer's disease Sister   . Hypertension Brother   . Diabetes Brother   . Drug abuse Brother   . Colon cancer Neg Hx     Past Surgical History:  Procedure Laterality Date  . AV FISTULA PLACEMENT Left 08/02/2018   Procedure: ARTERIOVENOUS (AV) FISTULA CREATION  left arm radiocephlic;  Surgeon: Marty Heck, MD;  Location: [facility];  Service: Vascular;  Laterality: Left;  . AV FISTULA PLACEMENT Left 08/01/2019   Procedure: LEFT BRACHIOCEPHALIC ARTERIOVENOUS (AV) FISTULA CREATION;  Surgeon: [name], MD;  Location: [facility];  Service: Vascular;  Laterality: Left;  . BASCILIC VEIN TRANSPOSITION Left 10/03/2019   Procedure: BASILIC VEIN TRANSPOSITION LEFT SECOND STAGE;  Surgeon: [name], MD;  Location: [facility];  Service: Vascular;  Laterality: Left;  .  BIOPSY  01/25/2021    Procedure: BIOPSY;  Surgeon: Doran Stabler, MD;  Location: [facility] ENDOSCOPY;  Service: Gastroenterology;;  . CARDIAC CATHETERIZATION  10/2002; 12/19/2004   [name] 03/08/2011  . COLONOSCOPY  2013   Dr.John Henrene Pastor   . CORONARY ARTERY BYPASS GRAFT  02/24/2003   CABG X2/notes 03/08/2011  . CORONARY STENT INTERVENTION N/A 12/19/2019   Procedure: CORONARY STENT INTERVENTION;  Surgeon: Jettie Booze, MD;  Location: [facility] CV LAB;  Service: Cardiovascular;  Laterality: N/A;  . ENTEROSCOPY N/A 01/25/2021   Procedure: ENTEROSCOPY;  Surgeon: Doran Stabler, MD;  Location: [facility];  Service: Gastroenterology;  Laterality: N/A;  . ENTEROSCOPY N/A 02/13/2021   Procedure: ENTEROSCOPY;  Surgeon: Jackquline Denmark, MD;  Location: [facility] ENDOSCOPY;  Service: Endoscopy;  Laterality: N/A;  . ESOPHAGOGASTRODUODENOSCOPY (EGD) WITH PROPOFOL N/A 02/08/2019   Procedure: ESOPHAGOGASTRODUODENOSCOPY (EGD) WITH PROPOFOL;  Surgeon: Milus Banister, MD;  Location: [facility];  Service: Gastroenterology;  Laterality: N/A;  . ESOPHAGOGASTRODUODENOSCOPY (EGD) WITH PROPOFOL N/A 12/22/2019   Procedure: ESOPHAGOGASTRODUODENOSCOPY (EGD) WITH PROPOFOL;  Surgeon: Lavena Bullion, DO;  Location: [facility];  Service: Gastroenterology;  Laterality: N/A;  . ESOPHAGOGASTRODUODENOSCOPY (EGD) WITH PROPOFOL N/A 10/19/2020   Procedure: ESOPHAGOGASTRODUODENOSCOPY (EGD) WITH PROPOFOL;  Surgeon: Jackquline Denmark, MD;  Location: [facility] ENDOSCOPY;  Service: Endoscopy;  Laterality: N/A;  . ESOPHAGOGASTRODUODENOSCOPY (EGD) WITH PROPOFOL N/A 12/22/2020   Procedure: ESOPHAGOGASTRODUODENOSCOPY (EGD) WITH PROPOFOL;  Surgeon: Gatha Mayer, MD;  Location: [facility];  Service: Endoscopy;  Laterality: N/A;  . ESOPHAGOGASTRODUODENOSCOPY (EGD) WITH PROPOFOL N/A 01/09/2021   Procedure: ESOPHAGOGASTRODUODENOSCOPY (EGD) WITH PROPOFOL;  Surgeon: Irene Shipper, MD;  Location: [facility] ENDOSCOPY;  Service: Endoscopy;  Laterality: N/A;  . HEMOSTASIS CLIP  PLACEMENT  12/22/2019   Procedure: HEMOSTASIS CLIP PLACEMENT;  Surgeon: Lavena Bullion, DO;  Location: [facility] ENDOSCOPY;  Service: Gastroenterology;;  . HEMOSTASIS CLIP PLACEMENT  12/22/2020   Procedure: HEMOSTASIS CLIP PLACEMENT;  Surgeon: Gatha Mayer, MD;  Location[facility];  Service: Endoscopy;;  . HEMOSTASIS CONTROL  12/22/2020   Procedure: HEMOSTASIS CONTROL/hemospray;  Surgeon: Gatha Mayer, MD;  Location: [facility];  Service: Endoscopy;;  . HOT HEMOSTASIS N/A 02/08/2019   Procedure: HOT HEMOSTASIS (ARGON PLASMA COAGULATION/BICAP);  Surgeon: Milus Banister, MD;  Location: [facility] ENDOSCOPY;  Service: Gastroenterology;  Laterality: N/A;  . HOT HEMOSTASIS N/A 12/22/2019   Procedure: HOT HEMOSTASIS (ARGON PLASMA COAGULATION/BICAP);  Surgeon: Lavena Bullion, DO;  Location: [facility] ENDOSCOPY;  Service: Gastroenterology;  Laterality: N/A;  . HOT HEMOSTASIS N/A 10/19/2020   Procedure: HOT HEMOSTASIS (ARGON PLASMA COAGULATION/BICAP);  Surgeon: Jackquline Denmark, MD;  Location: [facility] ENDOSCOPY;  Service: Endoscopy;  Laterality: N/A;  . HOT HEMOSTASIS N/A 12/22/2020   Procedure: HOT HEMOSTASIS (ARGON PLASMA COAGULATION/BICAP);  Surgeon: Gatha Mayer, MD;  Location: [facility] ENDOSCOPY;  Service: Endoscopy;  Laterality: N/A;  . HOT HEMOSTASIS N/A 01/09/2021   Procedure: HOT HEMOSTASIS (ARGON PLASMA COAGULATION/BICAP);  Surgeon: Irene Shipper, MD;  Location: [facility] ENDOSCOPY;  Service: Endoscopy;  Laterality: N/A;  . HOT HEMOSTASIS N/A 01/25/2021   Procedure: HOT HEMOSTASIS (ARGON PLASMA COAGULATION/BICAP);  Surgeon: Doran Stabler, MD;  Location: [facility];  Service: Gastroenterology;  Laterality: N/A;  . HOT HEMOSTASIS N/A 02/13/2021   Procedure: HOT HEMOSTASIS (ARGON PLASMA COAGULATION/BICAP);  Surgeon: Jackquline Denmark, MD;  Location: [facility] ENDOSCOPY;  Service: Endoscopy;  Laterality: N/A;  . INTERTROCHANTERIC HIP FRACTURE SURGERY Left 11/2006   [name] 03/08/2011  . INTRAVASCULAR ULTRASOUND/IVUS N/A 12/19/2019    Procedure: Intravascular  Ultrasound/IVUS;  Surgeon: Jettie Booze, MD;  Location: [facility] CV LAB;  Service: Cardiovascular;  Laterality: N/A;  . IR FLUORO GUIDE CV LINE RIGHT  07/24/2019  . IR FLUORO GUIDE CV LINE RIGHT  07/30/2019  . IR US GUIDE VASC ACCESS RIGHT  07/24/2019  . IR US GUIDE VASC ACCESS RIGHT  07/30/2019  . LAPAROSCOPIC CHOLECYSTECTOMY  05/2006  . LIGATION OF COMPETING BRANCHES OF ARTERIOVENOUS FISTULA Left 11/05/2018   Procedure: LIGATION OF COMPETING BRANCHES OF ARTERIOVENOUS FISTULA  LEFT  ARM;  Surgeon: Marty Heck, MD;  Location: [facility];  Service: Vascular;  Laterality: Left;  . LUMBAR LAMINECTOMY/DECOMPRESSION MICRODISCECTOMY N/A 02/29/2016   Procedure: Left L4-5 Lateral Recess Decompression, Removal Extradural Intraspinal Facet Cyst;  Surgeon: Marybelle Killings, MD;  Location: [facility];  Service: Orthopedics;  Laterality: N/A;  . MULTIPLE TOOTH EXTRACTIONS    . ORIF MANDIBULAR FRACTURE Left 08/13/2004   ORIF of left body fracture mandible with KLS Martin 2.3-mm six hole/notes 03/08/2011  . RIGHT/LEFT HEART CATH AND CORONARY/GRAFT ANGIOGRAPHY N/A 12/19/2019   Procedure: RIGHT/LEFT HEART CATH AND CORONARY/GRAFT ANGIOGRAPHY;  Surgeon: Jettie Booze, MD;  Location: [facility] LAB;  Service: Cardiovascular;  Laterality: N/A;  . SCLEROTHERAPY  12/22/2019   Procedure: SCLEROTHERAPY;  Surgeon: Lavena Bullion, DO;  Location: [facility] ENDOSCOPY;  Service: Gastroenterology;;  . Clide Deutscher  02/13/2021   Procedure: Clide Deutscher;  Surgeon: Jackquline Denmark, MD;  Location: [facility] ENDOSCOPY;  Service: Endoscopy;;   Social History   Occupational History  . Occupation: retired  Tobacco Use  . Smoking status: Current Every Day Smoker    Packs/day: 0.50    Years: 43.00    Pack years: 21.50    Types: Cigarettes  . Smokeless tobacco: Never Used  Vaping Use  . Vaping Use: Never used  Substance and Sexual Activity  . Alcohol use: Not Currently    Alcohol/week: 12.0 standard  drinks    Types: 12 Standard drinks or equivalent per week    Comment: occassional  . Drug use: Not Currently    Types: Cocaine    Comment: hx of crack/cocaine 63yrs ago 10/01/2019- none  . Sexual activity: Yes    Partners: Male

## 2021-03-30 NOTE — Telephone Encounter (Signed)
Pt called stating his pharmacy won't release his oxycodone rx without an autho so he would like Dr.Yates to call with the autho and then he would like to be notified.   [phone]

## 2021-03-30 NOTE — Telephone Encounter (Signed)
Prior authorization sent in to Cover My Meds.  Awaiting response.  Jacob [name] Key: [id number] - PA Case ID: [id number]  I called patient to advise I have sent in to Cover My Meds because his insurance requires prior authorization and I am awaiting a response. He states that he has spoken with Ronalee Belts at the pharmacy and his medication will be ready at 5. I advised I have not gotten insurance approval at this time.

## 2021-03-30 NOTE — Addendum Note (Signed)
Addended by: Meyer Cory on: 03/30/2021 02:05 PM   Modules accepted: Orders

## 2021-03-30 NOTE — Telephone Encounter (Signed)
I called pharmacy. Insurance requires prior [place]. Tammy to check fax for information sent from pharmacy.

## 2021-03-31 DIAGNOSIS — N186 End stage renal disease: Secondary | ICD-10-CM | POA: Diagnosis not present

## 2021-03-31 DIAGNOSIS — Z992 Dependence on renal dialysis: Secondary | ICD-10-CM | POA: Diagnosis not present

## 2021-03-31 DIAGNOSIS — D631 Anemia in chronic kidney disease: Secondary | ICD-10-CM | POA: Diagnosis not present

## 2021-03-31 DIAGNOSIS — N2581 Secondary hyperparathyroidism of renal origin: Secondary | ICD-10-CM | POA: Diagnosis not present

## 2021-04-02 DIAGNOSIS — N2581 Secondary hyperparathyroidism of renal origin: Secondary | ICD-10-CM | POA: Diagnosis not present

## 2021-04-02 DIAGNOSIS — D631 Anemia in chronic kidney disease: Secondary | ICD-10-CM | POA: Diagnosis not present

## 2021-04-02 DIAGNOSIS — Z992 Dependence on renal dialysis: Secondary | ICD-10-CM | POA: Diagnosis not present

## 2021-04-02 DIAGNOSIS — N186 End stage renal disease: Secondary | ICD-10-CM | POA: Diagnosis not present

## 2021-04-05 DIAGNOSIS — D631 Anemia in chronic kidney disease: Secondary | ICD-10-CM | POA: Diagnosis not present

## 2021-04-05 DIAGNOSIS — R0789 Other chest pain: Secondary | ICD-10-CM | POA: Diagnosis not present

## 2021-04-05 DIAGNOSIS — L299 Pruritus, unspecified: Secondary | ICD-10-CM | POA: Diagnosis not present

## 2021-04-05 DIAGNOSIS — N186 End stage renal disease: Secondary | ICD-10-CM | POA: Diagnosis not present

## 2021-04-05 DIAGNOSIS — N2581 Secondary hyperparathyroidism of renal origin: Secondary | ICD-10-CM | POA: Diagnosis not present

## 2021-04-05 DIAGNOSIS — R079 Chest pain, unspecified: Secondary | ICD-10-CM | POA: Diagnosis not present

## 2021-04-05 DIAGNOSIS — R0902 Hypoxemia: Secondary | ICD-10-CM | POA: Diagnosis not present

## 2021-04-05 DIAGNOSIS — Z992 Dependence on renal dialysis: Secondary | ICD-10-CM | POA: Diagnosis not present

## 2021-04-05 DIAGNOSIS — I491 Atrial premature depolarization: Secondary | ICD-10-CM | POA: Diagnosis not present

## 2021-04-06 ENCOUNTER — Other Ambulatory Visit: Payer: Self-pay

## 2021-04-06 ENCOUNTER — Encounter: Payer: Self-pay | Admitting: Internal Medicine

## 2021-04-06 ENCOUNTER — Ambulatory Visit (INDEPENDENT_AMBULATORY_CARE_PROVIDER_SITE_OTHER): Payer: Medicare Other | Admitting: Internal Medicine

## 2021-04-06 DIAGNOSIS — B20 Human immunodeficiency virus [HIV] disease: Secondary | ICD-10-CM | POA: Diagnosis not present

## 2021-04-06 DIAGNOSIS — A048 Other specified bacterial intestinal infections: Secondary | ICD-10-CM | POA: Diagnosis not present

## 2021-04-06 DIAGNOSIS — M48061 Spinal stenosis, lumbar region without neurogenic claudication: Secondary | ICD-10-CM

## 2021-04-06 NOTE — Progress Notes (Signed)
Patient Active Problem List   Diagnosis Date Noted   Helicobacter pylori (H. pylori) infection 04/06/2021    Priority: High   Human immunodeficiency virus (HIV) disease ([place]) 09/02/2006    Priority: High   Lumbar foraminal stenosis 03/30/2021   Gastric hemorrhage due to angiodysplasia of stomach    GI bleed 11/24/2020   COVID-19 virus infection 11/24/2020   Other mechanical complication of cranial or spinal infusion catheter, sequela 11/11/2020   Subdural hematoma ([place]) 11/06/2020   Fall at home, initial encounter 11/05/2020   Nicotine dependence, cigarettes, uncomplicated 13/05/6577   Alcohol abuse 11/05/2020   Unspecified trochanteric fracture of left femur, initial encounter for closed fracture ([place]) 11/05/2020   Traumatic subdural hematoma, initial encounter ([place]) 11/05/2020   Fracture of metatarsal of right foot, closed 11/05/2020   Nondisplaced fracture of greater trochanter of left femur, initial encounter for closed fracture ([place])    Uremia    Angiodysplasia of intestine with hemorrhage    Pruritus, unspecified 10/02/2020   Iron deficiency anemia    Acute blood loss anemia    Acute on chronic anemia    Abnormal nuclear stress test 12/19/2019   Type 2 diabetes mellitus with diabetic neuropathy, unspecified ([place]) 08/07/2019   Allergy, unspecified, initial encounter 46/96/2952   Complication of vascular dialysis catheter 08/02/2019   Drug abuse counseling and surveillance of drug abuser 08/02/2019   Other specified coagulation defects ([place]) 08/02/2019   Secondary hyperparathyroidism of renal origin ([place]) 08/02/2019   Unspecified atrial fibrillation ([place]) 08/02/2019   ESRD on hemodialysis ([place])    Cocaine abuse ([place])    CHF exacerbation ([place]) 07/21/2019   Diabetic foot ulcer ([place]) 02/11/2019   AVM (arteriovenous malformation)    Melena 02/07/2019   Symptomatic anemia 02/06/2019   Hyperlipidemia associated with type 2 diabetes mellitus ([place]) 05/30/2018    Right foot ulcer ([place]) 02/28/2018   Chronic obstructive pulmonary disease ([place]) 02/28/2018   Anemia of chronic disease 11/20/2016   CHF (congestive heart failure) ([place]) 11/10/2016   History of lumbar laminectomy for spinal cord decompression 02/29/2016   Type 2 diabetes mellitus with diabetic polyneuropathy, with long-term current use of insulin ([place]) 06/17/2015   HTN (hypertension) 04/26/2015   DM (diabetes mellitus) ([place]) 04/26/2015   Ischemic cardiomyopathy 05/12/2014   Bunion of left foot 11/25/2011   Bunion, right foot 11/25/2011   Polysubstance abuse ([place]) 07/28/2011   Hip fracture, left ([place]) 04/04/2011   Closed fracture of neck of femur ([place]) 04/04/2011   Insomnia 02/18/2011   Coronary artery disease involving native heart without angina pectoris 04/20/2009   Chronic combined systolic (congestive) and diastolic (congestive) heart failure ([place]) 04/20/2009   Mixed hyperlipidemia 11/20/2006   Gout 11/20/2006   TOBACCO ABUSE 11/20/2006   Essential hypertension 11/20/2006    Patient's Medications  New Prescriptions   No medications on file  Previous Medications   ACETAMINOPHEN (TYLENOL) 650 MG CR TABLET    Take 1,300 mg by mouth 3 (three) times daily.   ALLOPURINOL (ZYLOPRIM) 100 MG TABLET    TAKE 1 TABLET(100 MG) BY MOUTH DAILY   ANORO ELLIPTA 62.5-25 MCG/INH AEPB    INHALE 1 PUFF BY MOUTH EVERY DAY   ATORVASTATIN (LIPITOR) 10 MG TABLET    Take 1 tablet (10 mg total) by mouth daily.   B-D UF III MINI PEN NEEDLES 31G X 5 MM MISC    USE FOUR TIMES DAILY   BICTEGRAVIR-EMTRICITABINE-TENOFOVIR AF (BIKTARVY) 50-200-25 MG TABS TABLET  Take 1 tablet by mouth daily.   CALCIUM ACETATE 667 MG TABS    Take 667-1,334 mg by mouth See admin instructions. Take 1,334 mg by mouth three times a day with meals and 667 mg with each snack   CARVEDILOL (COREG) 3.125 MG TABLET    Take 1 tablet (3.125 mg total) by mouth 2 (two) times daily.   CINACALCET HCL (SENSIPAR PO)    Take 1 tablet by mouth 3  (three) times a week. At dialysis   COLCHICINE 0.6 MG TABLET    Take 0.6-1.2 mg by mouth daily as needed (as directed for gout flares).   DICLOFENAC SODIUM (VOLTAREN) 1 % GEL    APPLY 2 GRAMS TO THE AFFECTED AREA THREE TIMES DAILY AS NEEDED FOR PAIN   FOLIC ACID (FOLVITE) 1 MG TABLET    Take 1 tablet (1 mg total) by mouth daily.   GABAPENTIN (NEURONTIN) 300 MG CAPSULE    Take 1 capsule (300 mg total) by mouth daily. Dose change due to renal function   GLUCOSE BLOOD (ONETOUCH ULTRA) TEST STRIP    Check blood sugar three times daily. Dx:E11.40   INSULIN LISPRO (HUMALOG) 100 UNIT/ML KWIKPEN    INJECT 20 UNITS UNDER THE SKIN THREE TIMES DAILY AS NEEDED FOR HIGH BLOOD SUGAR(ABOVE 150)   IRON SUCROSE IN SODIUM CHLORIDE 0.9 % 100 ML    Iron Sucrose (Venofer)   ISOSORBIDE MONONITRATE (IMDUR) 30 MG 24 HR TABLET    TAKE 1 TABLET BY MOUTH EVERY DAY   LANCETS (ONETOUCH DELICA PLUS [id number]) MISC    Inject 1 Device as directed 3 (three) times daily. Dx: E11.40   LATANOPROST (XALATAN) 0.005 % OPHTHALMIC SOLUTION    Place 1 drop into both eyes at bedtime.   LIDOCAINE-PRILOCAINE (EMLA) CREAM    Apply 1 application topically every Monday, Wednesday, and Friday with hemodialysis.   METHOXY PEG-EPOETIN BETA (MIRCERA IJ)    Mircera   MISOPROSTOL (CYTOTEC) 100 MCG TABLET    Take 1 tablet (100 mcg total) by mouth every 6 (six) hours.   MULTIVITAMIN (RENA-VIT) TABS TABLET    Take 1 tablet by mouth daily.   NITROGLYCERIN (NITROSTAT) 0.3 MG SL TABLET    ONE TABLET UNDER TONGUE AS NEEDED FOR CHEST PAIN   OXYCODONE-ACETAMINOPHEN (PERCOCET/ROXICET) 5-325 MG TABLET    Take 1 tablet by mouth 2 (two) times daily.   PANTOPRAZOLE (PROTONIX) 40 MG TABLET    Take 1 tablet (40 mg total) by mouth 2 (two) times daily before a meal.   POLYETHYLENE GLYCOL (MIRALAX / GLYCOLAX) 17 G PACKET    Take 17 g by mouth daily as needed for mild constipation.   SODIUM CHLORIDE IRRIGATION 0.9 % IRRIGATION    Cleanse right foot ulcer with saline  once daily.   TIMOLOL (TIMOPTIC) 0.5 % OPHTHALMIC SOLUTION    Place 1 drop into both eyes in the morning.  Modified Medications   No medications on file  Discontinued Medications   No medications on file    Subjective: Jacob [name] is in for an unscheduled visit.  He was referred back to me by GI following recent diagnosis of H. pylori infection.  He has had chronic problems with acid reflux.  He developed melena in March and underwent upper endoscopy on 01/25/2021.  He was found to have active gastritis with H. pylori infection.  He does not recall this but he was prescribed metronidazole and doxycycline to take along with his omeprazole.  He was also prescribed Pepto-Bismol but was  told not to take it because of his end-stage renal disease.  His pharmacy said that he did pick up the prescriptions.  He was given a 10-day supply.  He was then was told to stop his omeprazole.    He was readmitted to the hospital in late April with persistent melena and underwent repeat endoscopy which revealed a bleeding duodenal AVM.  I cannot find the test results but 1 phone note says that a follow-up Helicobacter breath test was positive in late May..  A phone note says that she was going to be prescribed omeprazole, amoxicillin and rifabutin in late May but his pharmacy notes that the only other antibiotic prescribed and picked up was doxycycline on 03/16/2021.  He does not recall taking that.  He has not resumed taking omeprazole.  He has not had any recent melena.  He is still bothered by acid reflux.  He denies any problems obtaining, taking or tolerating his Biktarvy and does not recall missing any doses when he has been at home.  He thinks he may have missed a few doses when he was hospitalized.  Today, he is primarily focused on his ongoing pain.  He says that he is scheduled to see Dr. Laurence Spates for some sort of injection to help him with his pain.  He has lots of questions for me about what to expect.  Review of  Systems: Review of Systems  Constitutional:  Negative for chills, diaphoresis, fever and weight loss.  Respiratory:  Negative for cough.   Cardiovascular:  Negative for chest pain.  Gastrointestinal:  Positive for heartburn. Negative for abdominal pain, diarrhea, nausea and vomiting.  Musculoskeletal:  Positive for back pain.  Psychiatric/Behavioral:  Negative for depression.    Past Medical History:  Diagnosis Date   Acute respiratory failure ([place]) 03/01/2018   Anemia    Arthritis    "all over; mostly knees and back" (02/28/2018)   Chronic combined systolic and diastolic CHF (congestive heart failure) (HCC)    Chronic lower back pain    stenosis   Community acquired pneumonia 09/06/2013   COPD (chronic obstructive pulmonary disease) ([place])    Coronary atherosclerosis of native coronary artery    a. 02/2003 s/p CABG x 2 (VG->RI, VG->RPDA; b. 11/2019 PCI: LM nl, LAD 90d, D3 50, RI 100, LCX 100p, OM3 100 - fills via L->L collats from D2/dLAD, RCA 100p, VG->RPDA ok, VG->RI 95 (3.5x48 Synergy XD DES).   Drug abuse ([place])    hx; tested for cocaine as recently as 2/08. says he is not using drugs now - avoided defib. for this reason    ESRD (end stage renal disease) ([place])    Hemo M-W-F- [facility]   Fall at home 10/2020   GERD (gastroesophageal reflux disease)    takes OTC meds as needed   GI bleeding    a. 11/2019 EGD: angiodysplastic lesions w/ bleeding s/p argon plasma/clipping/epi inj. Multiple admissions for the same.   Glaucoma    uses eye drops daily   Hepatitis B 1968   "tx'd w/isolation; caught it from toilet stools in gym"   History of blood transfusion 03/01/2019   History of colon polyps    benign   History of gout    takes Allopurinol daily as well as Colchicine-if needed (02/28/2018)   History of kidney stones    HTN (hypertension)    takes Coreg,Imdur.and Apresoline daily   Human immunodeficiency virus (HIV) disease ([place]) dx'd 1995   on [place] as of  12/2020.      Hyperlipidemia    Ischemic cardiomyopathy    a. 01/2019 Echo: EF 40-45%, diffuse HK, mild basal septal hypertrophy. Diast dysfxn. Nl RV size/fxn. Sev dil LA. Triv MR/TR/PR.   Muscle spasm    takes Zanaflex as needed   Myocardial infarction ([place]) ~ 2004/2005   Nocturia    Peripheral neuropathy    takes gabapentin daily   Pneumonia    "at least twice" (02/28/2018)   SDH (subdural hematoma) (HCC)    Syphilis, unspecified    Type II diabetes mellitus ([place]) 2004   Lantus daily.Average fasting blood sugar 125-199   Wears glasses    Wears partial dentures     Social History   Tobacco Use   Smoking status: Every Day    Packs/day: 0.50    Years: 43.00    Pack years: 21.50    Types: Cigarettes   Smokeless tobacco: Never  Vaping Use   Vaping Use: Never used  Substance Use Topics   Alcohol use: Not Currently    Alcohol/week: 12.0 standard drinks    Types: 12 Standard drinks or equivalent per week    Comment: occassional   Drug use: Not Currently    Types: Cocaine    Comment: hx of crack/cocaine 60yrs ago 10/01/2019- none    Family History  Problem Relation Age of Onset   Heart failure Father    Hypertension Father    Diabetes Brother    Heart attack Brother    Alzheimer's disease Mother    Stroke Sister    Diabetes Sister    Alzheimer's disease Sister    Hypertension Brother    Diabetes Brother    Drug abuse Brother    Colon cancer Neg Hx     Allergies  Allergen Reactions   Augmentin [Amoxicillin-Pot Clavulanate] Diarrhea and Other (See Comments)    Severe diarrhea   Mucinex Fast-Max Other (See Comments)    Intense sweating    Amphetamines Other (See Comments)    Unknown reaction    Health Maintenance  Topic Date Due   OPHTHALMOLOGY EXAM  10/07/2020   COVID-19 Vaccine (4 - Booster for Export series) 10/22/2020   FOOT EXAM  12/30/2020   INFLUENZA VACCINE  05/24/2021   HEMOGLOBIN A1C  05/28/2021   LIPID PANEL  12/15/2021   COLONOSCOPY (Pts 45-46yrs Insurance  coverage will need to be confirmed)  10/01/2022   TETANUS/TDAP  06/13/2029   Hepatitis C Screening  Completed   PNA vac Low Risk Adult  Completed   Zoster Vaccines- Shingrix  Completed   HPV VACCINES  Aged Out    Objective:  Vitals:   04/06/21 1441  BP: (!) 144/63  Pulse: 79  Temp: 98.1 F (36.7 C)  TempSrc: Oral  Weight: 181 lb (82.1 kg)   Body mass index is 23.24 kg/m.  Physical Exam Constitutional:      Comments: He seems a little overwhelmed by all that has been going on recently but is generally in good spirits.  Cardiovascular:     Rate and Rhythm: Normal rate and regular rhythm.     Heart sounds: No murmur heard. Pulmonary:     Effort: Pulmonary effort is normal.     Breath sounds: Normal breath sounds.  Abdominal:     Palpations: Abdomen is soft.     Tenderness: There is no abdominal tenderness.  Psychiatric:        Mood and Affect: Mood normal.    Lab Results Lab Results  Component  Value Date   WBC 6.6 02/25/2021   HGB 10.4 (L) 02/25/2021   HCT 33.1 (L) 02/25/2021   MCV 93.2 02/25/2021   PLT 198 02/25/2021    Lab Results  Component Value Date   CREATININE 8.0 (H) 02/25/2021   BUN 55 (H) 02/25/2021   NA 136 02/25/2021   K 4.1 02/25/2021   CL 91 (L) 02/25/2021   CO2 25 02/25/2021    Lab Results  Component Value Date   ALT 10 02/25/2021   AST 13 02/25/2021   ALKPHOS 64 02/12/2021   BILITOT 0.4 02/25/2021    Lab Results  Component Value Date   CHOL 143 12/15/2020   HDL 31 (L) 12/15/2020   LDLCALC 79 12/15/2020   TRIG 248 (H) 12/15/2020   CHOLHDL 4.6 12/15/2020   Lab Results  Component Value Date   LABRPR REACTIVE (A) 06/30/2020   RPRTITER 1:1 (H) 06/30/2020   HIV 1 RNA Quant  Date Value  06/30/2020 <20 Copies/mL  06/24/2019 <20 NOT DETECTED copies/mL  06/18/2018 <20 DETECTED copies/mL (A)   CD4 T Cell Abs (/uL)  Date Value  06/30/2020 403  06/24/2019 927  06/18/2018 1,220     Problem List Items Addressed This Visit        High   Human immunodeficiency virus (HIV) disease ([place])    His infection has been under excellent, long-term control.  He will get repeat blood work today and continue [employer].       Relevant Orders   T-helper cell (CD4)- ([facility] clinic only)   HIV-1 RNA quant-no reflex-bld   Helicobacter pylori (H. pylori) infection    He has failed 1 course of treatment for Helicobacter pylori.  I will repeat his breath test today to have documentation of ongoing positivity.  If still positive I will treat him with omeprazole, clarithromycin and renally adjusted amoxicillin (penicillin allergy is listed in his records but he had only diarrhea with a previous course of amoxicillin).  I also asked our pharmacist to check and see if bismuth can be given in patients on hemodialysis.  I will treat him for 2 weeks and arrange phone follow-up in 3 weeks.       Relevant Orders   H. pylori breath test     Unprioritized   Lumbar foraminal stenosis    I encouraged him to address any questions he has about his upcoming procedures with Drs. Lorin Mercy and Ernestina Patches.          [name], MD [facility] [phone] pager   [phone] cell 04/06/2021, 4:41 PM

## 2021-04-06 NOTE — Assessment & Plan Note (Addendum)
I encouraged him to address any questions he has about his upcoming procedures with Drs. Lorin Mercy and Ernestina Patches.

## 2021-04-06 NOTE — Assessment & Plan Note (Signed)
He has failed 1 course of treatment for Helicobacter pylori.  I will repeat his breath test today to have documentation of ongoing positivity.  If still positive I will treat him with omeprazole, clarithromycin and renally adjusted amoxicillin (penicillin allergy is listed in his records but he had only diarrhea with a previous course of amoxicillin).  I also asked our pharmacist to check and see if bismuth can be given in patients on hemodialysis.  I will treat him for 2 weeks and arrange phone follow-up in 3 weeks.

## 2021-04-06 NOTE — Assessment & Plan Note (Signed)
His infection has been under excellent, long-term control.  He will get repeat blood work today and continue [employer].

## 2021-04-07 DIAGNOSIS — N186 End stage renal disease: Secondary | ICD-10-CM | POA: Diagnosis not present

## 2021-04-07 DIAGNOSIS — L299 Pruritus, unspecified: Secondary | ICD-10-CM | POA: Diagnosis not present

## 2021-04-07 DIAGNOSIS — N2581 Secondary hyperparathyroidism of renal origin: Secondary | ICD-10-CM | POA: Diagnosis not present

## 2021-04-07 DIAGNOSIS — Z992 Dependence on renal dialysis: Secondary | ICD-10-CM | POA: Diagnosis not present

## 2021-04-07 DIAGNOSIS — D631 Anemia in chronic kidney disease: Secondary | ICD-10-CM | POA: Diagnosis not present

## 2021-04-07 LAB — T-HELPER CELL (CD4) - (RCID CLINIC ONLY)
CD4 % Helper T Cell: 31 % — ABNORMAL LOW (ref 33–65)
CD4 T Cell Abs: 406 /uL (ref 400–1790)

## 2021-04-08 ENCOUNTER — Telehealth: Payer: Self-pay | Admitting: Student-PharmD

## 2021-04-08 ENCOUNTER — Other Ambulatory Visit: Payer: Self-pay | Admitting: Pharmacist

## 2021-04-08 DIAGNOSIS — K219 Gastro-esophageal reflux disease without esophagitis: Secondary | ICD-10-CM

## 2021-04-08 MED ORDER — BISMUTH SUBSALICYLATE 262 MG PO CHEW
524.0000 mg | CHEWABLE_TABLET | Freq: Four times a day (QID) | ORAL | 0 refills | Status: DC
Start: 2021-04-08 — End: 2021-04-13

## 2021-04-08 MED ORDER — CLARITHROMYCIN 500 MG PO TABS: 500.0000 mg | ORAL_TABLET | Freq: Every day | ORAL | 0 refills | Status: DC

## 2021-04-08 MED ORDER — AMOXICILLIN 500 MG PO TABS: 500.0000 mg | ORAL_TABLET | Freq: Two times a day (BID) | ORAL | 0 refills | Status: DC

## 2021-04-08 NOTE — Telephone Encounter (Signed)
Asked by Dr. [name] to send in prescriptions that have been renally dosed (patient is on HD MWF) for bismuth subsalicylate, pantoprazole, clarithromycin, and amoxicillin to treat H. pylori.   Called patient to let him know that we are sending in the following medications to treat his H. pylori infection: -Bismuth subsalicylate 088 mg QID -Pantoprazole 40 mg BID -Clarithromycin 500 mg daily -Amoxicillin 500 mg q12h  He states that he has enough pantoprazole at home since he stopped taking it a while back so will not send prescription for this in. His preferred pharmacy is the [facility] on [address] sent these prescriptions there.   Scheduled patient for visit with Cassie for Tuesday 6/21 to review how to take all medications since there has been confusion in the past for him. Will coordinate a ride to pick him up so he can come to the visit.   Counseled him to make sure he has picked up all THREE new medications from his pharmacy and bring those along with the pantoprazole he has at home to this visit. Will provide him with a dosing calendar and ensure he he knows how to take all four medications as prescribed.   Rebbeca Paul, PharmD PGY1 Pharmacy Resident 04/08/2021 3:55 PM

## 2021-04-08 NOTE — Progress Notes (Signed)
.  is

## 2021-04-09 ENCOUNTER — Telehealth: Payer: Self-pay | Admitting: Nurse Practitioner

## 2021-04-09 ENCOUNTER — Telehealth: Payer: Self-pay | Admitting: Pharmacist

## 2021-04-09 DIAGNOSIS — L299 Pruritus, unspecified: Secondary | ICD-10-CM | POA: Diagnosis not present

## 2021-04-09 DIAGNOSIS — Z992 Dependence on renal dialysis: Secondary | ICD-10-CM | POA: Diagnosis not present

## 2021-04-09 DIAGNOSIS — D631 Anemia in chronic kidney disease: Secondary | ICD-10-CM | POA: Diagnosis not present

## 2021-04-09 DIAGNOSIS — N2581 Secondary hyperparathyroidism of renal origin: Secondary | ICD-10-CM | POA: Diagnosis not present

## 2021-04-09 DIAGNOSIS — N186 End stage renal disease: Secondary | ICD-10-CM | POA: Diagnosis not present

## 2021-04-09 LAB — HIV-1 RNA QUANT-NO REFLEX-BLD
HIV 1 RNA Quant: <20 Copies/mL — ABNORMAL HIGH
HIV-1 RNA Quant, Log: <1.3 Log cps/mL — ABNORMAL HIGH

## 2021-04-09 LAB — H. PYLORI BREATH TEST: H. pylori Breath Test: DETECTED — AB

## 2021-04-09 NOTE — Telephone Encounter (Signed)
Received call from [place]. He is very, very confused. I have found that Pepto bismol is ok in dialysis patients with no dose adjustments,but his dialysis nurse told him not to take it as it can cause increased risk of bleeding. He is refusing to take it one way or another. I had to tell him and spell clarithromycin and amoxicillin for him 5-6 times and he seemed still confused. He asked if he could take Pepcid in place of Pepto Bismol. I told him no and that I would need to talk to Dr. [name] first to see what he would like to do since he cannot take Pepto Bismol. Will call him back once I discuss with Dr. [name].

## 2021-04-09 NOTE — Telephone Encounter (Signed)
   Jacob [name] DOB: [birth year] MRN: [id number]   RIDER WAIVER AND RELEASE OF LIABILITY  For purposes of improving physical access to our facilities, [facility] is pleased to partner with third parties to provide [place] patients or other authorized individuals the option of convenient, on-demand ground transportation services (the Technical brewer") through use of the technology service that enables users to request on-demand ground transportation from independent third-party providers.  By opting to use and accept these [employer], I, the undersigned, hereby agree on behalf of myself, and on behalf of any minor child using the Government social research officer for whom I am the parent or legal guardian, as follows:  Government social research officer provided to me are provided by independent third-party transportation providers who are not [employer] or employees and who are unaffiliated with [employer]. [facility] is neither a transportation carrier nor a common or public carrier. [facility] has no control over the quality or safety of the transportation that occurs as a result of the [employer]. [facility] cannot guarantee that any third-party transportation provider will complete any arranged transportation service. [facility] makes no representation, warranty, or guarantee regarding the reliability, timeliness, quality, safety, suitability, or availability of any of the Transport Services or that they will be error free. I fully understand that traveling by vehicle involves risks and dangers of serious bodily injury, including permanent disability, paralysis, and death. I agree, on behalf of myself and on behalf of any minor child using the Transport Services for whom I am the parent or legal guardian, that the entire risk arising out of my use of the [employer] remains solely with me, to the maximum extent permitted under applicable law. The [employer] are provided "as  is" and "as available." [facility] disclaims all representations and warranties, express, implied or statutory, not expressly set out in these terms, including the implied warranties of merchantability and fitness for a particular purpose. I hereby waive and release [facility], its agents, employees, officers, directors, representatives, insurers, attorneys, assigns, successors, subsidiaries, and affiliates from any and all past, present, or future claims, demands, liabilities, actions, causes of action, or suits of any kind directly or indirectly arising from acceptance and use of the [employer]. I further waive and release [place] and its affiliates from all present and future liability and responsibility for any injury or death to persons or damages to property caused by or related to the use of the [employer]. I have read this Waiver and Release of Liability, and I understand the terms used in it and their legal significance. This Waiver is freely and voluntarily given with the understanding that my right (as well as the right of any minor child for whom I am the parent or legal guardian using the [employer]) to legal recourse against  in connection with the [employer] is knowingly surrendered in return for use of these services.   I attest that I read the consent document to [name], gave Mr. Cadman the opportunity to ask questions and answered the questions asked (if any). I affirm that [name] then provided consent for he's participation in this program.     Jacob [name]

## 2021-04-12 ENCOUNTER — Telehealth: Payer: Self-pay | Admitting: Student-PharmD

## 2021-04-12 DIAGNOSIS — Z992 Dependence on renal dialysis: Secondary | ICD-10-CM | POA: Diagnosis not present

## 2021-04-12 DIAGNOSIS — D631 Anemia in chronic kidney disease: Secondary | ICD-10-CM | POA: Diagnosis not present

## 2021-04-12 DIAGNOSIS — N186 End stage renal disease: Secondary | ICD-10-CM | POA: Diagnosis not present

## 2021-04-12 DIAGNOSIS — L299 Pruritus, unspecified: Secondary | ICD-10-CM | POA: Diagnosis not present

## 2021-04-12 DIAGNOSIS — N2581 Secondary hyperparathyroidism of renal origin: Secondary | ICD-10-CM | POA: Diagnosis not present

## 2021-04-12 NOTE — Telephone Encounter (Signed)
Called patient's pharmacy, confirmed that he has picked up his amoxicillin and clarithromycin. Then called the patient to remind him of his appointment tomorrow 6/21 at 10am and to bring his amoxicillin, clarithromycin, and pantoprazole to his appointment to educate him on his treatment regimen for H. pylori. He confirmed that he would do so.   Will discuss with Dr. [name] prior to his visit in the morning regarding a fourth medication for him since he refuses to take Cl[place].   Rebbeca Paul, PharmD PGY1 Pharmacy Resident 04/12/2021 3:22 PM

## 2021-04-13 ENCOUNTER — Ambulatory Visit (INDEPENDENT_AMBULATORY_CARE_PROVIDER_SITE_OTHER): Payer: Medicare Other | Admitting: Pharmacist

## 2021-04-13 ENCOUNTER — Other Ambulatory Visit: Payer: Self-pay

## 2021-04-13 ENCOUNTER — Telehealth: Payer: Self-pay | Admitting: Student-PharmD

## 2021-04-13 DIAGNOSIS — B20 Human immunodeficiency virus [HIV] disease: Secondary | ICD-10-CM

## 2021-04-13 DIAGNOSIS — A048 Other specified bacterial intestinal infections: Secondary | ICD-10-CM

## 2021-04-13 NOTE — Progress Notes (Signed)
HPI: Jacob [name] is a 70 y.o. male who presents to the [place] clinic for H. pylori follow-up.   Patient Active Problem List   Diagnosis Date Noted   Helicobacter pylori (H. pylori) infection 04/06/2021   Lumbar foraminal stenosis 03/30/2021   Gastric hemorrhage due to angiodysplasia of stomach    GI bleed 11/24/2020   COVID-19 virus infection 11/24/2020   Other mechanical complication of cranial or spinal infusion catheter, sequela 11/11/2020   Subdural hematoma ([place]) 11/06/2020   Fall at home, initial encounter 11/05/2020   Nicotine dependence, cigarettes, uncomplicated 56/21/3086   Alcohol abuse 11/05/2020   Unspecified trochanteric fracture of left femur, initial encounter for closed fracture ([place]) 11/05/2020   Traumatic subdural hematoma, initial encounter ([place]) 11/05/2020   Fracture of metatarsal of right foot, closed 11/05/2020   Nondisplaced fracture of greater trochanter of left femur, initial encounter for closed fracture ([place])    Uremia    Angiodysplasia of intestine with hemorrhage    Pruritus, unspecified 10/02/2020   Iron deficiency anemia    Acute blood loss anemia    Acute on chronic anemia    Abnormal nuclear stress test 12/19/2019   Type 2 diabetes mellitus with diabetic neuropathy, unspecified ([place]) 08/07/2019   Allergy, unspecified, initial encounter 57/84/6962   Complication of vascular dialysis catheter 08/02/2019   Drug abuse counseling and surveillance of drug abuser 08/02/2019   Other specified coagulation defects ([place]) 08/02/2019   Secondary hyperparathyroidism of renal origin ([place]) 08/02/2019   Unspecified atrial fibrillation ([place]) 08/02/2019   ESRD on hemodialysis ([place])    Cocaine abuse ([place])    CHF exacerbation ([place]) 07/21/2019   Diabetic foot ulcer ([place]) 02/11/2019   AVM (arteriovenous malformation)    Melena 02/07/2019   Symptomatic anemia 02/06/2019   Hyperlipidemia associated with type 2 diabetes mellitus ([place]) 05/30/2018   Right  foot ulcer ([place]) 02/28/2018   Chronic obstructive pulmonary disease ([place]) 02/28/2018   Anemia of chronic disease 11/20/2016   CHF (congestive heart failure) ([place]) 11/10/2016   History of lumbar laminectomy for spinal cord decompression 02/29/2016   Type 2 diabetes mellitus with diabetic polyneuropathy, with long-term current use of insulin ([place]) 06/17/2015   HTN (hypertension) 04/26/2015   DM (diabetes mellitus) ([place]) 04/26/2015   Ischemic cardiomyopathy 05/12/2014   Bunion of left foot 11/25/2011   Bunion, right foot 11/25/2011   Polysubstance abuse ([place]) 07/28/2011   Hip fracture, left ([place]) 04/04/2011   Closed fracture of neck of femur ([place]) 04/04/2011   Insomnia 02/18/2011   Coronary artery disease involving native heart without angina pectoris 04/20/2009   Chronic combined systolic (congestive) and diastolic (congestive) heart failure ([place]) 04/20/2009   Mixed hyperlipidemia 11/20/2006   Gout 11/20/2006   TOBACCO ABUSE 11/20/2006   Essential hypertension 11/20/2006   Human immunodeficiency virus (HIV) disease ([place]) 09/02/2006    Patient's Medications  New Prescriptions   No medications on file  Previous Medications   ACETAMINOPHEN (TYLENOL) 650 MG CR TABLET    Take 1,300 mg by mouth 3 (three) times daily.   ALLOPURINOL (ZYLOPRIM) 100 MG TABLET    TAKE 1 TABLET(100 MG) BY MOUTH DAILY   AMOXICILLIN (AMOXIL) 500 MG TABLET    Take 1 tablet (500 mg total) by mouth 2 (two) times daily.   ANORO ELLIPTA 62.5-25 MCG/INH AEPB    INHALE 1 PUFF BY MOUTH EVERY DAY   ATORVASTATIN (LIPITOR) 10 MG TABLET    Take 1 tablet (10 mg total) by mouth daily.   B-D UF  III MINI PEN NEEDLES 31G X 5 MM MISC    USE FOUR TIMES DAILY   BICTEGRAVIR-EMTRICITABINE-TENOFOVIR AF (BIKTARVY) 50-200-25 MG TABS TABLET    Take 1 tablet by mouth daily.   BISMUTH SUBSALICYLATE (PEPTO-BISMOL) 262 MG CHEWABLE TABLET    Chew 2 tablets (524 mg total) by mouth in the morning, at noon, in the evening, and at bedtime.    CALCIUM ACETATE 667 MG TABS    Take 667-1,334 mg by mouth See admin instructions. Take 1,334 mg by mouth three times a day with meals and 667 mg with each snack   CARVEDILOL (COREG) 3.125 MG TABLET    Take 1 tablet (3.125 mg total) by mouth 2 (two) times daily.   CINACALCET HCL (SENSIPAR PO)    Take 1 tablet by mouth 3 (three) times a week. At dialysis   CLARITHROMYCIN (BIAXIN) 500 MG TABLET    Take 1 tablet (500 mg total) by mouth daily.   COLCHICINE 0.6 MG TABLET    Take 0.6-1.2 mg by mouth daily as needed (as directed for gout flares).   DICLOFENAC SODIUM (VOLTAREN) 1 % GEL    APPLY 2 GRAMS TO THE AFFECTED AREA THREE TIMES DAILY AS NEEDED FOR PAIN   FOLIC ACID (FOLVITE) 1 MG TABLET    Take 1 tablet (1 mg total) by mouth daily.   GABAPENTIN (NEURONTIN) 300 MG CAPSULE    Take 1 capsule (300 mg total) by mouth daily. Dose change due to renal function   GLUCOSE BLOOD (ONETOUCH ULTRA) TEST STRIP    Check blood sugar three times daily. Dx:E11.40   INSULIN LISPRO (HUMALOG) 100 UNIT/ML KWIKPEN    INJECT 20 UNITS UNDER THE SKIN THREE TIMES DAILY AS NEEDED FOR HIGH BLOOD SUGAR(ABOVE 150)   IRON SUCROSE IN SODIUM CHLORIDE 0.9 % 100 ML    Iron Sucrose (Venofer)   ISOSORBIDE MONONITRATE (IMDUR) 30 MG 24 HR TABLET    TAKE 1 TABLET BY MOUTH EVERY DAY   LANCETS (ONETOUCH DELICA PLUS [id number]) MISC    Inject 1 Device as directed 3 (three) times daily. Dx: E11.40   LATANOPROST (XALATAN) 0.005 % OPHTHALMIC SOLUTION    Place 1 drop into both eyes at bedtime.   LIDOCAINE-PRILOCAINE (EMLA) CREAM    Apply 1 application topically every Monday, Wednesday, and Friday with hemodialysis.   METHOXY PEG-EPOETIN BETA (MIRCERA IJ)    Mircera   MISOPROSTOL (CYTOTEC) 100 MCG TABLET    Take 1 tablet (100 mcg total) by mouth every 6 (six) hours.   MULTIVITAMIN (RENA-VIT) TABS TABLET    Take 1 tablet by mouth daily.   NITROGLYCERIN (NITROSTAT) 0.3 MG SL TABLET    ONE TABLET UNDER TONGUE AS NEEDED FOR CHEST PAIN    OXYCODONE-ACETAMINOPHEN (PERCOCET/ROXICET) 5-325 MG TABLET    Take 1 tablet by mouth 2 (two) times daily.   PANTOPRAZOLE (PROTONIX) 40 MG TABLET    Take 1 tablet (40 mg total) by mouth 2 (two) times daily before a meal.   POLYETHYLENE GLYCOL (MIRALAX / GLYCOLAX) 17 G PACKET    Take 17 g by mouth daily as needed for mild constipation.   SODIUM CHLORIDE IRRIGATION 0.9 % IRRIGATION    Cleanse right foot ulcer with saline once daily.   TIMOLOL (TIMOPTIC) 0.5 % OPHTHALMIC SOLUTION    Place 1 drop into both eyes in the morning.  Modified Medications   No medications on file  Discontinued Medications   No medications on file    Allergies: Allergies  Allergen Reactions  Augmentin [Amoxicillin-Pot Clavulanate] Diarrhea and Other (See Comments)    Severe diarrhea   Mucinex Fast-Max Other (See Comments)    Intense sweating    Amphetamines Other (See Comments)    Unknown reaction    Past Medical History: Past Medical History:  Diagnosis Date   Acute respiratory failure ([place]) 03/01/2018   Anemia    Arthritis    "all over; mostly knees and back" (02/28/2018)   Chronic combined systolic and diastolic CHF (congestive heart failure) ([place])    Chronic lower back pain    stenosis   Community acquired pneumonia 09/06/2013   COPD (chronic obstructive pulmonary disease) ([place])    Coronary atherosclerosis of native coronary artery    a. 02/2003 s/p CABG x 2 (VG->RI, VG->RPDA; b. 11/2019 PCI: LM nl, LAD 90d, D3 50, RI 100, LCX 100p, OM3 100 - fills via L->L collats from D2/dLAD, RCA 100p, VG->RPDA ok, VG->RI 95 (3.5x48 Synergy XD DES).   Drug abuse ([place])    hx; tested for cocaine as recently as 2/08. says he is not using drugs now - avoided defib. for this reason    ESRD (end stage renal disease) ([place])    Hemo M-W-F- [facility]   Fall at home 10/2020   GERD (gastroesophageal reflux disease)    takes OTC meds as needed   GI bleeding    a. 11/2019 EGD: angiodysplastic lesions w/ bleeding s/p argon  plasma/clipping/epi inj. Multiple admissions for the same.   Glaucoma    uses eye drops daily   Hepatitis B 1968   "tx'd w/isolation; caught it from toilet stools in gym"   History of blood transfusion 03/01/2019   History of colon polyps    benign   History of gout    takes Allopurinol daily as well as Colchicine-if needed (02/28/2018)   History of kidney stones    HTN (hypertension)    takes Coreg,Imdur.and Apresoline daily   Human immunodeficiency virus (HIV) disease ([place]) dx'd 1995   on [place] as of 12/2020.     Hyperlipidemia    Ischemic cardiomyopathy    a. 01/2019 Echo: EF 40-45%, diffuse HK, mild basal septal hypertrophy. Diast dysfxn. Nl RV size/fxn. Sev dil LA. Triv MR/TR/PR.   Muscle spasm    takes Zanaflex as needed   Myocardial infarction ([place]) ~ 2004/2005   Nocturia    Peripheral neuropathy    takes gabapentin daily   Pneumonia    "at least twice" (02/28/2018)   SDH (subdural hematoma) ([facility])    Syphilis, unspecified    Type II diabetes mellitus ([place]) 2004   Lantus daily.Average fasting blood sugar 125-199   Wears glasses    Wears partial dentures     Social History: Social History   Socioeconomic History   Marital status: Single    Spouse name: Not on file   Number of children: 1   Years of education: Not on file   Highest education level: Not on file  Occupational History   Occupation: retired  Tobacco Use   Smoking status: Every Day    Packs/day: 0.50    Years: 43.00    Pack years: 21.50    Types: Cigarettes   Smokeless tobacco: Never  Vaping Use   Vaping Use: Never used  Substance and Sexual Activity   Alcohol use: Not Currently    Alcohol/week: 12.0 standard drinks    Types: 12 Standard drinks or equivalent per week    Comment: occassional   Drug use: Not Currently  Types: Cocaine    Comment: hx of crack/cocaine 52yrs ago 10/01/2019- none   Sexual activity: Yes    Partners: Male    Comment: ACCEPTED CONDOMS  Other Topics Concern   Not on  file  Social History Narrative   Retired.       As of 05/2015:   Diet: No salt    Caffeine   Married: Single    House: Condo, 2 stories, 1 person (self)    Pets: No    Current/Past profession: N/A   Exercise: walks daily    Living Will: Yes    DNR   POA/HPOA: No       Social Determinants of Radio broadcast assistant Strain: Low Risk    Difficulty of Paying Living Expenses: Not hard at all  Food Insecurity: No Food Insecurity   Worried About Charity fundraiser in the Last Year: Never true   Arboriculturist in the Last Year: Never true  Transportation Needs: No Transportation Needs   Lack of Transportation (Medical): No   Lack of Transportation (Non-Medical): No  Physical Activity: Not on file  Stress: Not on file  Social Connections: Not on file    Labs: Lab Results  Component Value Date   HIV1RNAQUANT <20 (H) 04/06/2021   HIV1RNAQUANT <20 06/30/2020   HIV1RNAQUANT <20 NOT DETECTED 06/24/2019   CD4TABS 406 04/06/2021   CD4TABS 403 06/30/2020   CD4TABS 927 06/24/2019    RPR and STI Lab Results  Component Value Date   LABRPR REACTIVE (A) 06/30/2020   LABRPR REACTIVE (A) 06/24/2019   LABRPR REACTIVE (A) 12/12/2017   LABRPR REACTIVE (A) 06/27/2017   LABRPR REACTIVE (A) 12/29/2015   RPRTITER 1:1 (H) 06/30/2020   RPRTITER 1:1 (H) 06/24/2019   RPRTITER 1:1 (H) 12/12/2017   RPRTITER 1:1 06/27/2017   RPRTITER 1:1 12/29/2015    No flowsheet data found.  Hepatitis B Lab Results  Component Value Date   HEPBSAB Reactive (A) 11/06/2020   HEPBSAG NON REACTIVE 11/06/2020   HEPBCAB Reactive (A) 11/06/2020   Hepatitis C No results found for: HEPCAB, HCVRNAPCRQN Hepatitis A Lab Results  Component Value Date   HAV REACTIVE (A) 04/25/2018   Lipids: Lab Results  Component Value Date   CHOL 143 12/15/2020   TRIG 248 (H) 12/15/2020   HDL 31 (L) 12/15/2020   CHOLHDL 4.6 12/15/2020   VLDL 19 12/21/2019   LDLCALC 79 12/15/2020    H. pylori  Regimen: -Clarithromycin 500 mg daily -Amoxicillin 500 mg BID -Pantoprazole 40 mg BID  Assessment: Mr. Saxton presents for H. pylori follow up. He has failed 1 course of treatment for H.pylori with metronidazole, doxycyline, and pantoprazole in April 2022. His H. pylori breath test on 04/06/21 was detected. He brings medication bottles for his new regimen of amoxicillin, clarithromycin, and pantoprazole with him today. The original plan per Dr. [name] was to also take bismuth subsalicylate, which has been shown to be safe in hemodialysis, but the patient's dialysis nurse told him it could increase his risk of bleeding so he refused to take it. Discussed with Dr. [name], okay to continue with only clarithromycin, amoxicillin, and pantoprazole, which have been renally dosed for HD.    Discussed importance of taking all medications as prescribed with no missed doses for the full 14 days and he expressed understanding. He mentions that he does not use a pill box at home, he just keeps his medications on his nightstand. However he accepted offer for me  to fill two weeks of AM/PM pill boxes for his H.pylori regimen to ensure he takes them appropriately. Filled pill boxes and also provided patient with a calendar detailing what he is taking and for how long. He will start taking the medications tomorrow morning (6/22) and complete them on 7/5 evening.   Plan: -Take clarithromycin, amoxicillin, pantoprazole as prescribed (regimen above) for 2 weeks using pill boxes filled in office today -Follow up virtual visit 7/7 with Dr. Francena Hanly, PharmD PGY1 Pharmacy Resident 04/13/2021 9:56 AM

## 2021-04-13 NOTE — Telephone Encounter (Signed)
Received a call from Mr. Plack asking if we could fax the names of the new medications he will be taking for H. pylori to his dialysis center [facility], Fax # [phone]). His updated medication list with these new medications has now been faxed there.   Rebbeca Paul, PharmD PGY1 Pharmacy Resident 04/13/2021 2:55 PM

## 2021-04-14 ENCOUNTER — Other Ambulatory Visit: Payer: Self-pay | Admitting: Family

## 2021-04-14 ENCOUNTER — Other Ambulatory Visit: Payer: Self-pay | Admitting: Nurse Practitioner

## 2021-04-14 ENCOUNTER — Other Ambulatory Visit: Payer: Self-pay | Admitting: Cardiovascular Disease

## 2021-04-14 DIAGNOSIS — N2581 Secondary hyperparathyroidism of renal origin: Secondary | ICD-10-CM | POA: Diagnosis not present

## 2021-04-14 DIAGNOSIS — N186 End stage renal disease: Secondary | ICD-10-CM | POA: Diagnosis not present

## 2021-04-14 DIAGNOSIS — Z992 Dependence on renal dialysis: Secondary | ICD-10-CM | POA: Diagnosis not present

## 2021-04-14 DIAGNOSIS — L299 Pruritus, unspecified: Secondary | ICD-10-CM | POA: Diagnosis not present

## 2021-04-14 DIAGNOSIS — D631 Anemia in chronic kidney disease: Secondary | ICD-10-CM | POA: Diagnosis not present

## 2021-04-14 NOTE — Telephone Encounter (Signed)
High risk or very high risk warning populated when attempting to refill medication. RX request sent to PCP for review and approval if warranted.   

## 2021-04-15 ENCOUNTER — Encounter: Payer: Self-pay | Admitting: Physical Medicine and Rehabilitation

## 2021-04-15 ENCOUNTER — Other Ambulatory Visit: Payer: Self-pay

## 2021-04-15 ENCOUNTER — Ambulatory Visit: Payer: Self-pay

## 2021-04-15 ENCOUNTER — Ambulatory Visit (INDEPENDENT_AMBULATORY_CARE_PROVIDER_SITE_OTHER): Payer: Medicare Other | Admitting: Physical Medicine and Rehabilitation

## 2021-04-15 VITALS — BP 139/66 | HR 76

## 2021-04-15 DIAGNOSIS — M5416 Radiculopathy, lumbar region: Secondary | ICD-10-CM | POA: Diagnosis not present

## 2021-04-15 MED ORDER — BETAMETHASONE SOD PHOS & ACET 6 (3-3) MG/ML IJ SUSP
12.0000 mg | Freq: Once | INTRAMUSCULAR | Status: AC
  Administered 2021-04-15: 12 mg

## 2021-04-15 NOTE — Patient Instructions (Signed)

## 2021-04-15 NOTE — Progress Notes (Signed)
Pt state lower back pain that travels to his right hip and to his right foot. Pt state walking, standing, sitting and laying down makes the pain worse. Pt state when he comes back from dialysis and having to climbing fourteen steps to his apartment. Pt state he takes pain meds to help ease his pain. Pt stae he is on anti-bio since yesterday.  Numeric Pain Rating Scale and Functional Assessment Average Pain 7   In the last MONTH (on 0-10 scale) has pain interfered with the following?  1. General activity like being  able to carry out your everyday physical activities such as walking, climbing stairs, carrying groceries, or moving a chair?  Rating(10)   +Driver, -BT, -Dye Allergies.

## 2021-04-16 DIAGNOSIS — N186 End stage renal disease: Secondary | ICD-10-CM | POA: Diagnosis not present

## 2021-04-16 DIAGNOSIS — L299 Pruritus, unspecified: Secondary | ICD-10-CM | POA: Diagnosis not present

## 2021-04-16 DIAGNOSIS — Z992 Dependence on renal dialysis: Secondary | ICD-10-CM | POA: Diagnosis not present

## 2021-04-16 DIAGNOSIS — N2581 Secondary hyperparathyroidism of renal origin: Secondary | ICD-10-CM | POA: Diagnosis not present

## 2021-04-16 DIAGNOSIS — D631 Anemia in chronic kidney disease: Secondary | ICD-10-CM | POA: Diagnosis not present

## 2021-04-18 NOTE — Progress Notes (Signed)
Jacob [name] - 70 y.o. male MRN [id number]  Date of birth: December 22, 1950  Office Visit Note: Visit Date: 04/15/2021 PCP: [name], NP Referred by: [name], NP  Subjective: Chief Complaint  Patient presents with   Lower Back - Pain   Right Hip - Pain   Right Foot - Pain   Right Leg - Pain   HPI:  Jacob [name] is a 70 y.o. male who comes in today at the request of Dr. Rodell Perna for planned Right L5-S1 Lumbar Interlaminar epidural steroid injection with fluoroscopic guidance.  The patient has failed conservative care including home exercise, medications, time and activity modification.  This injection will be diagnostic and hopefully therapeutic.  Please see requesting physician notes for further details and justification.  MRI does show facet arthropathy listhesis of L4 on L5 with lateral recess narrowing also lateral recess narrowing L5-S1.  Disc herniation.  Patient's course is complicated by diabetes with history of polyneuropathy, end-stage renal disease on dialysis and HIV.   ROS Otherwise per HPI.  Assessment & Plan: Visit Diagnoses:    ICD-10-CM   1. Lumbar radiculopathy  M54.16 XR C-ARM NO REPORT    Epidural Steroid injection    betamethasone acetate-betamethasone sodium phosphate (CELESTONE) injection 12 mg      Plan: No additional findings.   Meds & Orders:  Meds ordered this encounter  Medications   betamethasone acetate-betamethasone sodium phosphate (CELESTONE) injection 12 mg    Orders Placed This Encounter  Procedures   XR C-ARM NO REPORT   Epidural Steroid injection    Follow-up: No follow-ups on file.   Procedures: No procedures performed  Lumbar Epidural Steroid Injection - Interlaminar Approach with Fluoroscopic Guidance  Patient: Jacob [name]      Date of Birth: [birth year] MRN: [id number] PCP: [name], NP      Visit Date: 04/15/2021   Universal Protocol:     Consent Given By: the patient  Position:  PRONE  Additional Comments: Vital signs were monitored before and after the procedure. Patient was prepped and draped in the usual sterile fashion. The correct patient, procedure, and site was verified.   Injection Procedure Details:   Procedure diagnoses: Lumbar radiculopathy [M54.16]   Meds Administered:  Meds ordered this encounter  Medications   betamethasone acetate-betamethasone sodium phosphate (CELESTONE) injection 12 mg     Laterality: Right  Location/Site:  L5-S1  Needle: 3.5 in., 20 ga. Tuohy  Needle Placement: Paramedian epidural  Findings:   -Comments: Excellent flow of contrast into the epidural space.  Procedure Details: Using a paramedian approach from the side mentioned above, the region overlying the inferior lamina was localized under fluoroscopic visualization and the soft tissues overlying this structure were infiltrated with 4 ml. of 1% Lidocaine without Epinephrine. The Tuohy needle was inserted into the epidural space using a paramedian approach.   The epidural space was localized using loss of resistance along with counter oblique bi-planar fluoroscopic views.  After negative aspirate for air, blood, and CSF, a 2 ml. volume of Isovue-250 was injected into the epidural space and the flow of contrast was observed. Radiographs were obtained for documentation purposes.    The injectate was administered into the level noted above.   Additional Comments:  No complications occurred Dressing: 2 x 2 sterile gauze and Band-Aid    Post-procedure details: Patient was observed during the procedure. Post-procedure instructions were reviewed.  Patient left the clinic in stable condition.   Clinical History:  MRI LUMBAR SPINE WITHOUT AND WITH CONTRAST   TECHNIQUE: Multiplanar and multiecho pulse sequences of the lumbar spine were obtained without and with intravenous contrast.   CONTRAST:  7.57mL GADAVIST GADOBUTROL 1 MMOL/ML IV SOLN   COMPARISON:   Radiography 09/08/2020.  MRI 12/29/2015   FINDINGS: Segmentation: 5 lumbar type vertebral bodies as numbered previously.   Alignment: 5 mm degenerative anterolisthesis at L4-5, increased a mm or 2 since 2017.   Vertebrae: Discogenic endplate marrow changes at L4-5 and L5-S1 with mild edema and enhancement could contribute to low back pain.   Conus medullaris and cauda equina: Conus extends to the L1 level. Conus and cauda equina appear normal.   Paraspinal and other soft tissues: Small renal cysts. No acute finding.   Disc levels:   T11-12 and T12-L1: Normal   L1-2 and L2-3: Minimal disc bulges.  No stenosis.   L3-4: Minimal disc bulge. Mild facet and ligamentous hypertrophy. Mild narrowing of the lateral recesses but no apparent neural compression. The facet arthritis could contribute to low back pain. The facet arthritis has worsened slightly since 2017.   L4-5: Bilateral facet degeneration with facet and ligamentous hypertrophy. 5 mm of anterolisthesis, increased from about 3 mm in 2017. Worsened disc degeneration with broad-based herniation of the disc. Multifactorial spinal stenosis at this level that could cause neural compression on either or both sides. Bilateral foraminal stenosis could affect the exiting L4 nerves as well. Synovial cyst previously seen associated with the facet on the left is not clearly recognizable.   L5-S1: Disc degeneration with loss of disc height. Endplate osteophytes and shallow protrusion of the disc. Facet degeneration and hypertrophy. Stenosis of both subarticular lateral recesses and neural foramina could cause neural compression on either or both sides. Findings have worsened since the previous exam.   IMPRESSION: L3-4: Mild bulging of the disc. Facet degeneration and hypertrophy which is progressive since 2017. No sign of neural compressive stenosis. The facet arthritis could contribute to low back pain.   L4-5: Worsening of  disease at this level. Bilateral facet degeneration and hypertrophy with anterolisthesis of 5 mm, increased from 3 mm previously. Broad-based herniation of the disc. Stenosis of the canal, lateral recesses and neural foramina could cause neural compression on either or both sides. Discogenic endplate marrow changes could relate to low back pain. The facet arthropathy could relate to low back pain.   L5-S1: Worsening of disease at this level. Disc degeneration with loss of disc height, endplate osteophytes and shallow protrusion of the disc. Facet degeneration and hypertrophy. Stenosis of the subarticular lateral recesses and neural foramina that could cause neural compression on either or both sides. Endplate edematous changes could relate to low back pain.     Electronically Signed   By: Nelson Chimes M.D.   On: 03/23/2021 14:41     Objective:  VS:  HT:    WT:   BMI:     BP:139/66  HR:76bpm  TEMP: ( )  RESP:  Physical Exam Vitals and nursing note reviewed.  Constitutional:      General: He is not in acute distress.    Appearance: Normal appearance. He is not ill-appearing.  HENT:     Head: Normocephalic and atraumatic.     Right Ear: External ear normal.     Left Ear: External ear normal.     Nose: No congestion.  Eyes:     Extraocular Movements: Extraocular movements intact.  Cardiovascular:     Rate and Rhythm: Normal  rate.     Pulses: Normal pulses.  Pulmonary:     Effort: Pulmonary effort is normal. No respiratory distress.  Abdominal:     General: There is no distension.     Palpations: Abdomen is soft.  Musculoskeletal:        General: No tenderness or signs of injury.     Cervical back: Neck supple.     Right lower leg: No edema.     Left lower leg: No edema.     Comments: Patient has good distal strength without clonus.  Skin:    Findings: No erythema or rash.  Neurological:     General: No focal deficit present.     Mental Status: He is alert and  oriented to person, place, and time.     Sensory: No sensory deficit.     Motor: No weakness or abnormal muscle tone.     Coordination: Coordination normal.  Psychiatric:        Mood and Affect: Mood normal.        Behavior: Behavior normal.     Imaging: No results found.

## 2021-04-18 NOTE — Procedures (Signed)
Lumbar Epidural Steroid Injection - Interlaminar Approach with Fluoroscopic Guidance  Patient: Jacob [name]      Date of Birth: Feb 22, 1951 MRN: [id number] PCP: [name], NP      Visit Date: 04/15/2021   Universal Protocol:     Consent Given By: the patient  Position: PRONE  Additional Comments: Vital signs were monitored before and after the procedure. Patient was prepped and draped in the usual sterile fashion. The correct patient, procedure, and site was verified.   Injection Procedure Details:   Procedure diagnoses: Lumbar radiculopathy [M54.16]   Meds Administered:  Meds ordered this encounter  Medications   betamethasone acetate-betamethasone sodium phosphate (CELESTONE) injection 12 mg     Laterality: Right  Location/Site:  L5-S1  Needle: 3.5 in., 20 ga. Tuohy  Needle Placement: Paramedian epidural  Findings:   -Comments: Excellent flow of contrast into the epidural space.  Procedure Details: Using a paramedian approach from the side mentioned above, the region overlying the inferior lamina was localized under fluoroscopic visualization and the soft tissues overlying this structure were infiltrated with 4 ml. of 1% Lidocaine without Epinephrine. The Tuohy needle was inserted into the epidural space using a paramedian approach.   The epidural space was localized using loss of resistance along with counter oblique bi-planar fluoroscopic views.  After negative aspirate for air, blood, and CSF, a 2 ml. volume of Isovue-250 was injected into the epidural space and the flow of contrast was observed. Radiographs were obtained for documentation purposes.    The injectate was administered into the level noted above.   Additional Comments:  No complications occurred Dressing: 2 x 2 sterile gauze and Band-Aid    Post-procedure details: Patient was observed during the procedure. Post-procedure instructions were reviewed.  Patient left the clinic in stable  condition.

## 2021-04-19 DIAGNOSIS — N2581 Secondary hyperparathyroidism of renal origin: Secondary | ICD-10-CM | POA: Diagnosis not present

## 2021-04-19 DIAGNOSIS — Z992 Dependence on renal dialysis: Secondary | ICD-10-CM | POA: Diagnosis not present

## 2021-04-19 DIAGNOSIS — N186 End stage renal disease: Secondary | ICD-10-CM | POA: Diagnosis not present

## 2021-04-21 DIAGNOSIS — Z992 Dependence on renal dialysis: Secondary | ICD-10-CM | POA: Diagnosis not present

## 2021-04-21 DIAGNOSIS — N2581 Secondary hyperparathyroidism of renal origin: Secondary | ICD-10-CM | POA: Diagnosis not present

## 2021-04-21 DIAGNOSIS — N186 End stage renal disease: Secondary | ICD-10-CM | POA: Diagnosis not present

## 2021-04-22 DIAGNOSIS — Z992 Dependence on renal dialysis: Secondary | ICD-10-CM | POA: Diagnosis not present

## 2021-04-22 DIAGNOSIS — I129 Hypertensive chronic kidney disease with stage 1 through stage 4 chronic kidney disease, or unspecified chronic kidney disease: Secondary | ICD-10-CM | POA: Diagnosis not present

## 2021-04-22 DIAGNOSIS — N186 End stage renal disease: Secondary | ICD-10-CM | POA: Diagnosis not present

## 2021-04-23 DIAGNOSIS — N186 End stage renal disease: Secondary | ICD-10-CM | POA: Diagnosis not present

## 2021-04-23 DIAGNOSIS — Z992 Dependence on renal dialysis: Secondary | ICD-10-CM | POA: Diagnosis not present

## 2021-04-23 DIAGNOSIS — N2581 Secondary hyperparathyroidism of renal origin: Secondary | ICD-10-CM | POA: Diagnosis not present

## 2021-04-23 DIAGNOSIS — D509 Iron deficiency anemia, unspecified: Secondary | ICD-10-CM | POA: Diagnosis not present

## 2021-04-23 DIAGNOSIS — E1122 Type 2 diabetes mellitus with diabetic chronic kidney disease: Secondary | ICD-10-CM | POA: Diagnosis not present

## 2021-04-23 DIAGNOSIS — D631 Anemia in chronic kidney disease: Secondary | ICD-10-CM | POA: Diagnosis not present

## 2021-04-26 DIAGNOSIS — D631 Anemia in chronic kidney disease: Secondary | ICD-10-CM | POA: Diagnosis not present

## 2021-04-26 DIAGNOSIS — Z992 Dependence on renal dialysis: Secondary | ICD-10-CM | POA: Diagnosis not present

## 2021-04-26 DIAGNOSIS — N2581 Secondary hyperparathyroidism of renal origin: Secondary | ICD-10-CM | POA: Diagnosis not present

## 2021-04-26 DIAGNOSIS — N186 End stage renal disease: Secondary | ICD-10-CM | POA: Diagnosis not present

## 2021-04-26 DIAGNOSIS — E1122 Type 2 diabetes mellitus with diabetic chronic kidney disease: Secondary | ICD-10-CM | POA: Diagnosis not present

## 2021-04-26 DIAGNOSIS — D509 Iron deficiency anemia, unspecified: Secondary | ICD-10-CM | POA: Diagnosis not present

## 2021-04-28 ENCOUNTER — Telehealth: Payer: Self-pay | Admitting: Orthopaedic Surgery

## 2021-04-28 ENCOUNTER — Other Ambulatory Visit: Payer: Self-pay | Admitting: Surgical

## 2021-04-28 ENCOUNTER — Telehealth: Payer: Self-pay | Admitting: Physical Medicine and Rehabilitation

## 2021-04-28 DIAGNOSIS — D509 Iron deficiency anemia, unspecified: Secondary | ICD-10-CM | POA: Diagnosis not present

## 2021-04-28 DIAGNOSIS — N186 End stage renal disease: Secondary | ICD-10-CM | POA: Diagnosis not present

## 2021-04-28 DIAGNOSIS — N2581 Secondary hyperparathyroidism of renal origin: Secondary | ICD-10-CM | POA: Diagnosis not present

## 2021-04-28 DIAGNOSIS — Z992 Dependence on renal dialysis: Secondary | ICD-10-CM | POA: Diagnosis not present

## 2021-04-28 DIAGNOSIS — D631 Anemia in chronic kidney disease: Secondary | ICD-10-CM | POA: Diagnosis not present

## 2021-04-28 DIAGNOSIS — E1122 Type 2 diabetes mellitus with diabetic chronic kidney disease: Secondary | ICD-10-CM | POA: Diagnosis not present

## 2021-04-28 MED ORDER — OXYCODONE-ACETAMINOPHEN 5-325 MG PO TABS: 1.0000 | ORAL_TABLET | Freq: Every day | ORAL | 0 refills | Status: DC | PRN

## 2021-04-28 NOTE — Telephone Encounter (Signed)
Pt called stating he got an inj about 2 weeks and it hasn't helped at all. Ptwould like a CB to discuss further.   [phone]

## 2021-04-28 NOTE — Telephone Encounter (Signed)
Patient called asked if he can get something called into his pharmacy for the pain in his right hip. Patient said the injection did not work  that Dr. Ernestina Patches gave him. Patient said it's been two weeks tomorrow. Patient said he can hardly move and can't sleep at night. Patient uses the [facility] on [address]. The number to contact patient is [phone]

## 2021-04-28 NOTE — Telephone Encounter (Signed)
Please advise 

## 2021-04-29 ENCOUNTER — Other Ambulatory Visit: Payer: Self-pay

## 2021-04-29 ENCOUNTER — Telehealth (INDEPENDENT_AMBULATORY_CARE_PROVIDER_SITE_OTHER): Payer: Medicare Other | Admitting: Internal Medicine

## 2021-04-29 ENCOUNTER — Telehealth: Payer: Self-pay | Admitting: Orthopaedic Surgery

## 2021-04-29 ENCOUNTER — Other Ambulatory Visit: Payer: Self-pay | Admitting: Orthopaedic Surgery

## 2021-04-29 ENCOUNTER — Encounter: Payer: Self-pay | Admitting: Internal Medicine

## 2021-04-29 DIAGNOSIS — A048 Other specified bacterial intestinal infections: Secondary | ICD-10-CM

## 2021-04-29 NOTE — Telephone Encounter (Signed)
Dr. Lucia Estelle you please advise? Per your last office note, patient was to try a single injection.  He has gotten no relief from injection two weeks ago and can hardly walk.

## 2021-04-29 NOTE — Telephone Encounter (Signed)
Pt called stating he returned cal to Dr. Lorin Mercy. Pt ask for a call back. Phone number is [phone].

## 2021-04-29 NOTE — Telephone Encounter (Signed)
Pt called asking for a call back from dr. Lorin Mercy about his pain. Pt states injection did not help. Please call pt at [phone].

## 2021-04-29 NOTE — Telephone Encounter (Signed)
Duplicate message has been sent to Dr. Lorin Mercy to advise.

## 2021-04-29 NOTE — Telephone Encounter (Signed)
Please see below. Dr. Lorin Mercy wants patient to see Dr. Louanne Skye.

## 2021-04-29 NOTE — Telephone Encounter (Signed)
noted 

## 2021-04-29 NOTE — Telephone Encounter (Signed)
Please advise 

## 2021-04-29 NOTE — Progress Notes (Signed)
Virtual Visit via Telephone Note  I connected with [name] on 04/29/21 at  9:15 AM EDT by telephone and verified that I am speaking with the correct person using two identifiers.  Location: Patient: Home Provider: [facility]   I discussed the limitations, risks, security and privacy concerns of performing an evaluation and management service by telephone and the availability of in person appointments. I also discussed with the patient that there may be a patient responsible charge related to this service. The patient expressed understanding and agreed to proceed.   History of Present Illness: I called and spoke with Jacob [name] this morning.  He completed his clarithromycin and amoxicillin 2 days ago for persistent Helicobacter pylori infection.  He thinks he is also stopped his pantoprazole.  His heartburn has resolved and he has not had any recent melena.  He has not missed any doses of his Biktarvy.   Observations/Objective: HIV 1 RNA Quant  Date Value  04/06/2021 <20 Copies/mL (H)  06/30/2020 <20 Copies/mL  06/24/2019 <20 NOT DETECTED copies/mL   CD4 T Cell Abs (/uL)  Date Value  04/06/2021 406  06/30/2020 403  06/24/2019 927     Assessment and Plan: Instructed him to make sure that he has stopped his pantoprazole.  He will follow-up here in 4 weeks for a repeat Helicobacter pylori breath test.  His HIV infection remains under excellent long-term control.  Follow Up Instructions: Continue Biktarvy Discontinue pantoprazole Follow-up in 4 weeks   I discussed the assessment and treatment plan with the patient. The patient was provided an opportunity to ask questions and all were answered. The patient agreed with the plan and demonstrated an understanding of the instructions.   The patient was advised to call back or seek an in-person evaluation if the symptoms worsen or if the condition fails to improve as anticipated.  I provided 16 minutes of non-face-to-face time during this  encounter.   [name], MD

## 2021-04-30 DIAGNOSIS — N2581 Secondary hyperparathyroidism of renal origin: Secondary | ICD-10-CM | POA: Diagnosis not present

## 2021-04-30 DIAGNOSIS — D509 Iron deficiency anemia, unspecified: Secondary | ICD-10-CM | POA: Diagnosis not present

## 2021-04-30 DIAGNOSIS — Z992 Dependence on renal dialysis: Secondary | ICD-10-CM | POA: Diagnosis not present

## 2021-04-30 DIAGNOSIS — N186 End stage renal disease: Secondary | ICD-10-CM | POA: Diagnosis not present

## 2021-04-30 DIAGNOSIS — E1122 Type 2 diabetes mellitus with diabetic chronic kidney disease: Secondary | ICD-10-CM | POA: Diagnosis not present

## 2021-04-30 DIAGNOSIS — D631 Anemia in chronic kidney disease: Secondary | ICD-10-CM | POA: Diagnosis not present

## 2021-04-30 NOTE — Telephone Encounter (Signed)
Appt has been made for 06/10/21, due to his dialysis schedule this is the first appt I can get him on.  He has Dialysis on Mon, Wed, Fri. Dr. Louanne Skye is in surgery on Tuesdays and that only leaves Thursdays for pt to be seen.

## 2021-05-02 ENCOUNTER — Emergency Department (HOSPITAL_COMMUNITY)
Admission: EM | Admit: 2021-05-02 | Discharge: 2021-05-02 | Disposition: A | Discharge status: Home / Self Care | Payer: Medicare Other | Source: Home / Self Care | Attending: Emergency Medicine | Admitting: Emergency Medicine

## 2021-05-02 ENCOUNTER — Other Ambulatory Visit: Payer: Self-pay

## 2021-05-02 ENCOUNTER — Encounter (HOSPITAL_COMMUNITY): Payer: Self-pay | Admitting: Emergency Medicine

## 2021-05-02 ENCOUNTER — Emergency Department (HOSPITAL_COMMUNITY): Payer: Medicare Other

## 2021-05-02 DIAGNOSIS — E1122 Type 2 diabetes mellitus with diabetic chronic kidney disease: Secondary | ICD-10-CM | POA: Insufficient documentation

## 2021-05-02 DIAGNOSIS — E8809 Other disorders of plasma-protein metabolism, not elsewhere classified: Secondary | ICD-10-CM | POA: Diagnosis not present

## 2021-05-02 DIAGNOSIS — I499 Cardiac arrhythmia, unspecified: Secondary | ICD-10-CM | POA: Diagnosis not present

## 2021-05-02 DIAGNOSIS — D6959 Other secondary thrombocytopenia: Secondary | ICD-10-CM | POA: Diagnosis not present

## 2021-05-02 DIAGNOSIS — N281 Cyst of kidney, acquired: Secondary | ICD-10-CM | POA: Diagnosis not present

## 2021-05-02 DIAGNOSIS — Z955 Presence of coronary angioplasty implant and graft: Secondary | ICD-10-CM | POA: Diagnosis not present

## 2021-05-02 DIAGNOSIS — Z888 Allergy status to other drugs, medicaments and biological substances status: Secondary | ICD-10-CM | POA: Diagnosis not present

## 2021-05-02 DIAGNOSIS — N186 End stage renal disease: Secondary | ICD-10-CM | POA: Insufficient documentation

## 2021-05-02 DIAGNOSIS — D649 Anemia, unspecified: Secondary | ICD-10-CM | POA: Diagnosis not present

## 2021-05-02 DIAGNOSIS — Z794 Long term (current) use of insulin: Secondary | ICD-10-CM | POA: Diagnosis not present

## 2021-05-02 DIAGNOSIS — Z21 Asymptomatic human immunodeficiency virus [HIV] infection status: Secondary | ICD-10-CM | POA: Insufficient documentation

## 2021-05-02 DIAGNOSIS — Z8616 Personal history of COVID-19: Secondary | ICD-10-CM | POA: Insufficient documentation

## 2021-05-02 DIAGNOSIS — E875 Hyperkalemia: Secondary | ICD-10-CM | POA: Diagnosis not present

## 2021-05-02 DIAGNOSIS — R579 Shock, unspecified: Secondary | ICD-10-CM | POA: Diagnosis not present

## 2021-05-02 DIAGNOSIS — K31811 Angiodysplasia of stomach and duodenum with bleeding: Secondary | ICD-10-CM | POA: Diagnosis not present

## 2021-05-02 DIAGNOSIS — K922 Gastrointestinal hemorrhage, unspecified: Secondary | ICD-10-CM | POA: Diagnosis not present

## 2021-05-02 DIAGNOSIS — Z7982 Long term (current) use of aspirin: Secondary | ICD-10-CM | POA: Insufficient documentation

## 2021-05-02 DIAGNOSIS — Z20822 Contact with and (suspected) exposure to covid-19: Secondary | ICD-10-CM | POA: Diagnosis not present

## 2021-05-02 DIAGNOSIS — R1110 Vomiting, unspecified: Secondary | ICD-10-CM | POA: Diagnosis not present

## 2021-05-02 DIAGNOSIS — R109 Unspecified abdominal pain: Secondary | ICD-10-CM

## 2021-05-02 DIAGNOSIS — K920 Hematemesis: Secondary | ICD-10-CM | POA: Diagnosis not present

## 2021-05-02 DIAGNOSIS — Z743 Need for continuous supervision: Secondary | ICD-10-CM | POA: Diagnosis not present

## 2021-05-02 DIAGNOSIS — Z992 Dependence on renal dialysis: Secondary | ICD-10-CM | POA: Insufficient documentation

## 2021-05-02 DIAGNOSIS — G9340 Encephalopathy, unspecified: Secondary | ICD-10-CM | POA: Diagnosis not present

## 2021-05-02 DIAGNOSIS — R531 Weakness: Secondary | ICD-10-CM | POA: Diagnosis not present

## 2021-05-02 DIAGNOSIS — I4510 Unspecified right bundle-branch block: Secondary | ICD-10-CM | POA: Diagnosis not present

## 2021-05-02 DIAGNOSIS — J449 Chronic obstructive pulmonary disease, unspecified: Secondary | ICD-10-CM | POA: Insufficient documentation

## 2021-05-02 DIAGNOSIS — N2581 Secondary hyperparathyroidism of renal origin: Secondary | ICD-10-CM | POA: Diagnosis not present

## 2021-05-02 DIAGNOSIS — Z9049 Acquired absence of other specified parts of digestive tract: Secondary | ICD-10-CM | POA: Diagnosis not present

## 2021-05-02 DIAGNOSIS — I5042 Chronic combined systolic (congestive) and diastolic (congestive) heart failure: Secondary | ICD-10-CM | POA: Diagnosis not present

## 2021-05-02 DIAGNOSIS — R1013 Epigastric pain: Secondary | ICD-10-CM | POA: Diagnosis not present

## 2021-05-02 DIAGNOSIS — R58 Hemorrhage, not elsewhere classified: Secondary | ICD-10-CM | POA: Diagnosis not present

## 2021-05-02 DIAGNOSIS — E1129 Type 2 diabetes mellitus with other diabetic kidney complication: Secondary | ICD-10-CM | POA: Diagnosis not present

## 2021-05-02 DIAGNOSIS — I132 Hypertensive heart and chronic kidney disease with heart failure and with stage 5 chronic kidney disease, or end stage renal disease: Secondary | ICD-10-CM | POA: Insufficient documentation

## 2021-05-02 DIAGNOSIS — F17210 Nicotine dependence, cigarettes, uncomplicated: Secondary | ICD-10-CM | POA: Insufficient documentation

## 2021-05-02 DIAGNOSIS — R195 Other fecal abnormalities: Secondary | ICD-10-CM | POA: Diagnosis not present

## 2021-05-02 DIAGNOSIS — D62 Acute posthemorrhagic anemia: Secondary | ICD-10-CM | POA: Diagnosis not present

## 2021-05-02 DIAGNOSIS — R110 Nausea: Secondary | ICD-10-CM | POA: Insufficient documentation

## 2021-05-02 DIAGNOSIS — D696 Thrombocytopenia, unspecified: Secondary | ICD-10-CM | POA: Diagnosis not present

## 2021-05-02 DIAGNOSIS — B20 Human immunodeficiency virus [HIV] disease: Secondary | ICD-10-CM | POA: Diagnosis not present

## 2021-05-02 DIAGNOSIS — R0689 Other abnormalities of breathing: Secondary | ICD-10-CM | POA: Diagnosis not present

## 2021-05-02 DIAGNOSIS — K31819 Angiodysplasia of stomach and duodenum without bleeding: Secondary | ICD-10-CM | POA: Diagnosis not present

## 2021-05-02 DIAGNOSIS — A048 Other specified bacterial intestinal infections: Secondary | ICD-10-CM | POA: Diagnosis not present

## 2021-05-02 DIAGNOSIS — I5040 Unspecified combined systolic (congestive) and diastolic (congestive) heart failure: Secondary | ICD-10-CM | POA: Diagnosis not present

## 2021-05-02 DIAGNOSIS — K625 Hemorrhage of anus and rectum: Secondary | ICD-10-CM | POA: Diagnosis not present

## 2021-05-02 DIAGNOSIS — R1011 Right upper quadrant pain: Secondary | ICD-10-CM | POA: Diagnosis not present

## 2021-05-02 DIAGNOSIS — R1012 Left upper quadrant pain: Secondary | ICD-10-CM | POA: Diagnosis not present

## 2021-05-02 DIAGNOSIS — Z951 Presence of aortocoronary bypass graft: Secondary | ICD-10-CM | POA: Insufficient documentation

## 2021-05-02 DIAGNOSIS — I10 Essential (primary) hypertension: Secondary | ICD-10-CM | POA: Diagnosis not present

## 2021-05-02 DIAGNOSIS — E1142 Type 2 diabetes mellitus with diabetic polyneuropathy: Secondary | ICD-10-CM | POA: Insufficient documentation

## 2021-05-02 DIAGNOSIS — K5730 Diverticulosis of large intestine without perforation or abscess without bleeding: Secondary | ICD-10-CM | POA: Diagnosis not present

## 2021-05-02 DIAGNOSIS — Q2730 Arteriovenous malformation, site unspecified: Secondary | ICD-10-CM | POA: Diagnosis not present

## 2021-05-02 DIAGNOSIS — R1084 Generalized abdominal pain: Secondary | ICD-10-CM | POA: Diagnosis not present

## 2021-05-02 DIAGNOSIS — I2510 Atherosclerotic heart disease of native coronary artery without angina pectoris: Secondary | ICD-10-CM | POA: Insufficient documentation

## 2021-05-02 DIAGNOSIS — G9349 Other encephalopathy: Secondary | ICD-10-CM | POA: Diagnosis not present

## 2021-05-02 DIAGNOSIS — D689 Coagulation defect, unspecified: Secondary | ICD-10-CM | POA: Diagnosis not present

## 2021-05-02 DIAGNOSIS — Z7951 Long term (current) use of inhaled steroids: Secondary | ICD-10-CM | POA: Insufficient documentation

## 2021-05-02 DIAGNOSIS — D631 Anemia in chronic kidney disease: Secondary | ICD-10-CM | POA: Diagnosis not present

## 2021-05-02 DIAGNOSIS — R578 Other shock: Secondary | ICD-10-CM | POA: Diagnosis not present

## 2021-05-02 DIAGNOSIS — K921 Melena: Secondary | ICD-10-CM | POA: Diagnosis not present

## 2021-05-02 DIAGNOSIS — Z880 Allergy status to penicillin: Secondary | ICD-10-CM | POA: Diagnosis not present

## 2021-05-02 DIAGNOSIS — R6889 Other general symptoms and signs: Secondary | ICD-10-CM | POA: Diagnosis not present

## 2021-05-02 DIAGNOSIS — K3189 Other diseases of stomach and duodenum: Secondary | ICD-10-CM | POA: Diagnosis not present

## 2021-05-02 DIAGNOSIS — N2889 Other specified disorders of kidney and ureter: Secondary | ICD-10-CM | POA: Diagnosis not present

## 2021-05-02 DIAGNOSIS — R1033 Periumbilical pain: Secondary | ICD-10-CM | POA: Insufficient documentation

## 2021-05-02 DIAGNOSIS — Z79899 Other long term (current) drug therapy: Secondary | ICD-10-CM | POA: Insufficient documentation

## 2021-05-02 DIAGNOSIS — R0902 Hypoxemia: Secondary | ICD-10-CM | POA: Diagnosis not present

## 2021-05-02 LAB — LIPASE, BLOOD: Lipase: 87 U/L — ABNORMAL HIGH (ref 11–51)

## 2021-05-02 LAB — CBC
HCT: 31 % — ABNORMAL LOW (ref 39.0–52.0)
Hemoglobin: 9.5 g/dL — ABNORMAL LOW (ref 13.0–17.0)
MCH: 27.9 pg (ref 26.0–34.0)
MCHC: 30.6 g/dL (ref 30.0–36.0)
MCV: 90.9 fL (ref 80.0–100.0)
Platelets: 124 10*3/uL — ABNORMAL LOW (ref 150–400)
RBC: 3.41 MIL/uL — ABNORMAL LOW (ref 4.22–5.81)
RDW: 19.2 % — ABNORMAL HIGH (ref 11.5–15.5)
WBC: 5.7 10*3/uL (ref 4.0–10.5)
nRBC: 0 % (ref 0.0–0.2)

## 2021-05-02 LAB — COMPREHENSIVE METABOLIC PANEL
ALT: 15 U/L (ref 0–44)
AST: 22 U/L (ref 15–41)
Albumin: 3.1 g/dL — ABNORMAL LOW (ref 3.5–5.0)
Alkaline Phosphatase: 44 U/L (ref 38–126)
Anion gap: 14 (ref 5–15)
BUN: 103 mg/dL — ABNORMAL HIGH (ref 8–23)
CO2: 29 mmol/L (ref 22–32)
Calcium: 8.6 mg/dL — ABNORMAL LOW (ref 8.9–10.3)
Chloride: 93 mmol/L — ABNORMAL LOW (ref 98–111)
Creatinine, Ser: 7.65 mg/dL — ABNORMAL HIGH (ref 0.61–1.24)
GFR, Estimated: 7 mL/min — ABNORMAL LOW (ref >60)
Glucose, Bld: 165 mg/dL — ABNORMAL HIGH (ref 70–99)
Potassium: 5 mmol/L (ref 3.5–5.1)
Sodium: 136 mmol/L (ref 135–145)
Total Bilirubin: 0.6 mg/dL (ref 0.3–1.2)
Total Protein: 6.9 g/dL (ref 6.5–8.1)

## 2021-05-02 MED ORDER — FAMOTIDINE IN NACL 20-0.9 MG/50ML-% IV SOLN
20.0000 mg | Freq: Once | INTRAVENOUS | Status: AC
  Administered 2021-05-02: 20 mg via INTRAVENOUS
  Filled 2021-05-02: qty 50

## 2021-05-02 MED ORDER — ONDANSETRON HCL 4 MG/2ML IJ SOLN
4.0000 mg | Freq: Once | INTRAMUSCULAR | Status: DC
  Filled 2021-05-02: qty 2

## 2021-05-02 NOTE — ED Triage Notes (Signed)
Pt to triage via [facility] from home.  EMS called out for SOB but on arrival pt denied SOB and states he was having generalized abd pan and epigastric pain.  Recently diagnosed with H. Pylori.  Vomited brown emesis x 1 this morning.  On arrival pt denies abd pain and reports bilateral hip pain, leg pain, arm pain, and feet numbness.    20g R FA.

## 2021-05-02 NOTE — Discharge Instructions (Addendum)
You have been seen and discharged from the emergency department.  Follow-up with your primary provider for reevaluation and further care. Take home medications as prescribed. If you have any worsening symptoms, severe abdominal pain or further concerns for your health please return to an emergency department for further evaluation.

## 2021-05-02 NOTE — ED Notes (Signed)
Patient transported to CT 

## 2021-05-02 NOTE — ED Provider Notes (Signed)
[facility] EMERGENCY DEPARTMENT Provider Note   CSN: [id number] Arrival date & time: 05/02/21  4259     History No chief complaint on file.   Jacob [name] is a 70 y.o. male.  HPI  70 year old male with past medical history of COPD, GI bleeding, end-stage renal disease on hemodialysis, "problems with his pancreas", HTN, HLD presents to the emergency department with concern for abdominal discomfort.  Patient reportedly called EMS for shortness of breath but here he denies that complaint.  He states he is having mild periumbilical and upper abdominal pain.  He was recently treated for H. pylori infection, completed his antibiotics and discontinued his famotidine and Protonix 2 days ago.  He states he woke up this morning feeling unwell with discomfort in his upper abdomen.  Mild nausea but no vomiting/diarrhea.  No dark stools.  No fever.  Past Medical History:  Diagnosis Date   Acute respiratory failure ([place]) 03/01/2018   Anemia    Arthritis    "all over; mostly knees and back" (02/28/2018)   Chronic combined systolic and diastolic CHF (congestive heart failure) (HCC)    Chronic lower back pain    stenosis   Community acquired pneumonia 09/06/2013   COPD (chronic obstructive pulmonary disease) ([place])    Coronary atherosclerosis of native coronary artery    a. 02/2003 s/p CABG x 2 (VG->RI, VG->RPDA; b. 11/2019 PCI: LM nl, LAD 90d, D3 50, RI 100, LCX 100p, OM3 100 - fills via L->L collats from D2/dLAD, RCA 100p, VG->RPDA ok, VG->RI 95 (3.5x48 Synergy XD DES).   Drug abuse ([place])    hx; tested for cocaine as recently as 2/08. says he is not using drugs now - avoided defib. for this reason    ESRD (end stage renal disease) ([place])    Hemo M-W-F- [facility] Blade   Fall at home 10/2020   GERD (gastroesophageal reflux disease)    takes OTC meds as needed   GI bleeding    a. 11/2019 EGD: angiodysplastic lesions w/ bleeding s/p argon plasma/clipping/epi inj. Multiple admissions for  the same.   Glaucoma    uses eye drops daily   Hepatitis B 1968   "tx'd w/isolation; caught it from toilet stools in gym"   History of blood transfusion 03/01/2019   History of colon polyps    benign   History of gout    takes Allopurinol daily as well as Colchicine-if needed (02/28/2018)   History of kidney stones    HTN (hypertension)    takes Coreg,Imdur.and Apresoline daily   Human immunodeficiency virus (HIV) disease ([place]) dx'd 1995   on [place] as of 12/2020.     Hyperlipidemia    Ischemic cardiomyopathy    a. 01/2019 Echo: EF 40-45%, diffuse HK, mild basal septal hypertrophy. Diast dysfxn. Nl RV size/fxn. Sev dil LA. Triv MR/TR/PR.   Muscle spasm    takes Zanaflex as needed   Myocardial infarction ([place]) ~ 2004/2005   Nocturia    Peripheral neuropathy    takes gabapentin daily   Pneumonia    "at least twice" (02/28/2018)   SDH (subdural hematoma) (HCC)    Syphilis, unspecified    Type II diabetes mellitus ([place]) 2004   Lantus daily.Average fasting blood sugar 125-199   Wears glasses    Wears partial dentures     Patient Active Problem List   Diagnosis Date Noted   Helicobacter pylori (H. pylori) infection 04/06/2021   Lumbar foraminal stenosis 03/30/2021   Gastric hemorrhage due  to angiodysplasia of stomach    GI bleed 11/24/2020   COVID-19 virus infection 11/24/2020   Other mechanical complication of cranial or spinal infusion catheter, sequela 11/11/2020   Subdural hematoma ([place]) 11/06/2020   Fall at home, initial encounter 11/05/2020   Nicotine dependence, cigarettes, uncomplicated 65/99/3570   Alcohol abuse 11/05/2020   Unspecified trochanteric fracture of left femur, initial encounter for closed fracture ([place]) 11/05/2020   Traumatic subdural hematoma, initial encounter ([place]) 11/05/2020   Fracture of metatarsal of right foot, closed 11/05/2020   Nondisplaced fracture of greater trochanter of left femur, initial encounter for closed fracture ([place])    Uremia     Angiodysplasia of intestine with hemorrhage    Pruritus, unspecified 10/02/2020   Iron deficiency anemia    Acute blood loss anemia    Acute on chronic anemia    Abnormal nuclear stress test 12/19/2019   Type 2 diabetes mellitus with diabetic neuropathy, unspecified ([place]) 08/07/2019   Allergy, unspecified, initial encounter 17/79/3903   Complication of vascular dialysis catheter 08/02/2019   Drug abuse counseling and surveillance of drug abuser 08/02/2019   Other specified coagulation defects ([place]) 08/02/2019   Secondary hyperparathyroidism of renal origin ([place]) 08/02/2019   Unspecified atrial fibrillation ([employer]) 08/02/2019   ESRD on hemodialysis ([place])    Cocaine abuse ([place])    CHF exacerbation ([place]) 07/21/2019   Diabetic foot ulcer ([place]) 02/11/2019   AVM (arteriovenous malformation)    [place]ena 02/07/2019   Symptomatic anemia 02/06/2019   Hyperlipidemia associated with type 2 diabetes mellitus ([place]) 05/30/2018   Right foot ulcer ([place]) 02/28/2018   Chronic obstructive pulmonary disease ([place]) 02/28/2018   Anemia of chronic disease 11/20/2016   CHF (congestive heart failure) ([place]) 11/10/2016   History of lumbar laminectomy for spinal cord decompression 02/29/2016   Type 2 diabetes mellitus with diabetic polyneuropathy, with long-term current use of insulin ([place]) 06/17/2015   HTN (hypertension) 04/26/2015   DM (diabetes mellitus) ([place]) 04/26/2015   Ischemic cardiomyopathy 05/12/2014   Bunion of left foot 11/25/2011   Bunion, right foot 11/25/2011   Polysubstance abuse ([place]) 07/28/2011   Hip fracture, left ([place]) 04/04/2011   Closed fracture of neck of femur ([place]) 04/04/2011   Insomnia 02/18/2011   Coronary artery disease involving native heart without angina pectoris 04/20/2009   Chronic combined systolic (congestive) and diastolic (congestive) heart failure ([place]) 04/20/2009   Mixed hyperlipidemia 11/20/2006   Gout 11/20/2006   TOBACCO ABUSE 11/20/2006   Essential hypertension  11/20/2006   Human immunodeficiency virus (HIV) disease ([place]) 09/02/2006    Past Surgical History:  Procedure Laterality Date   AV FISTULA PLACEMENT Left 08/02/2018   Procedure: ARTERIOVENOUS (AV) FISTULA CREATION  left arm radiocephlic;  Surgeon: Marty Heck, MD;  Location: [place];  Service: Vascular;  Laterality: Left;   AV FISTULA PLACEMENT Left 08/01/2019   Procedure: LEFT BRACHIOCEPHALIC ARTERIOVENOUS (AV) FISTULA CREATION;  Surgeon: [name], MD;  Location: [place];  Service: Vascular;  Laterality: Left;   [place] Left 10/03/2019   Procedure: BASILIC VEIN TRANSPOSITION LEFT SECOND STAGE;  Surgeon: [name], MD;  Location: [place];  Service: Vascular;  Laterality: Left;   BIOPSY  01/25/2021   Procedure: BIOPSY;  Surgeon: Doran Stabler, MD;  Location: [facility] ENDOSCOPY;  Service: Gastroenterology;;   CARDIAC CATHETERIZATION  10/2002; 12/19/2004   [name] 03/08/2011   COLONOSCOPY  2013   [place]    CORONARY ARTERY BYPASS GRAFT  02/24/2003   CABG X2/notes 03/08/2011  CORONARY STENT INTERVENTION N/A 12/19/2019   Procedure: CORONARY STENT INTERVENTION;  Surgeon: Jettie Booze, MD;  Location: [place] CV LAB;  Service: Cardiovascular;  Laterality: N/A;   ENTEROSCOPY N/A 01/25/2021   Procedure: ENTEROSCOPY;  Surgeon: Doran Stabler, MD;  Location: [place];  Service: Gastroenterology;  Laterality: N/A;   ENTEROSCOPY N/A 02/13/2021   Procedure: ENTEROSCOPY;  Surgeon: Jackquline Denmark, MD;  Location: [facility] ENDOSCOPY;  Service: Endoscopy;  Laterality: N/A;   ESOPHAGOGASTRODUODENOSCOPY (EGD) WITH PROPOFOL N/A 02/08/2019   Procedure: ESOPHAGOGASTRODUODENOSCOPY (EGD) WITH PROPOFOL;  Surgeon: Milus Banister, MD;  Location: [place];  Service: Gastroenterology;  Laterality: N/A;   ESOPHAGOGASTRODUODENOSCOPY (EGD) WITH PROPOFOL N/A 12/22/2019   Procedure: ESOPHAGOGASTRODUODENOSCOPY (EGD) WITH PROPOFOL;  Surgeon: Lavena Bullion, DO;  Location: [place];  Service: Gastroenterology;  Laterality: N/A;   ESOPHAGOGASTRODUODENOSCOPY (EGD) WITH PROPOFOL N/A 10/19/2020   Procedure: ESOPHAGOGASTRODUODENOSCOPY (EGD) WITH PROPOFOL;  Surgeon: Jackquline Denmark, MD;  Location: [facility] ENDOSCOPY;  Service: Endoscopy;  Laterality: N/A;   ESOPHAGOGASTRODUODENOSCOPY (EGD) WITH PROPOFOL N/A 12/22/2020   Procedure: ESOPHAGOGASTRODUODENOSCOPY (EGD) WITH PROPOFOL;  Surgeon: Gatha Mayer, MD;  Location: [place];  Service: Endoscopy;  Laterality: N/A;   ESOPHAGOGASTRODUODENOSCOPY (EGD) WITH PROPOFOL N/A 01/09/2021   Procedure: ESOPHAGOGASTRODUODENOSCOPY (EGD) WITH PROPOFOL;  Surgeon: Irene Shipper, MD;  Location: [facility] ENDOSCOPY;  Service: Endoscopy;  Laterality: N/A;   HEMOSTASIS CLIP PLACEMENT  12/22/2019   Procedure: HEMOSTASIS CLIP PLACEMENT;  Surgeon: Lavena Bullion, DO;  Location: [place] ENDOSCOPY;  Service: Gastroenterology;;   HEMOSTASIS CLIP PLACEMENT  12/22/2020   Procedure: HEMOSTASIS CLIP PLACEMENT;  Surgeon: Gatha Mayer, MD;  Location: [facility] ENDOSCOPY;  Service: Endoscopy;;   HEMOSTASIS CONTROL  12/22/2020   Procedure: HEMOSTASIS CONTROL/hemospray;  Surgeon: Gatha Mayer, MD;  Location: [place];  Service: Endoscopy;;   HOT HEMOSTASIS N/A 02/08/2019   Procedure: HOT HEMOSTASIS (ARGON PLASMA COAGULATION/BICAP);  Surgeon: Milus Banister, MD;  Location: [facility] ENDOSCOPY;  Service: Gastroenterology;  Laterality: N/A;   HOT HEMOSTASIS N/A 12/22/2019   Procedure: HOT HEMOSTASIS (ARGON PLASMA COAGULATION/BICAP);  Surgeon: Lavena Bullion, DO;  Location: [facility] ENDOSCOPY;  Service: Gastroenterology;  Laterality: N/A;   HOT HEMOSTASIS N/A 10/19/2020   Procedure: HOT HEMOSTASIS (ARGON PLASMA COAGULATION/BICAP);  Surgeon: Jackquline Denmark, MD;  Location: [facility] ENDOSCOPY;  Service: Endoscopy;  Laterality: N/A;   HOT HEMOSTASIS N/A 12/22/2020   Procedure: HOT HEMOSTASIS (ARGON PLASMA COAGULATION/BICAP);  Surgeon: Gatha Mayer, MD;  Location: [facility] ENDOSCOPY;  Service:  Endoscopy;  Laterality: N/A;   HOT HEMOSTASIS N/A 01/09/2021   Procedure: HOT HEMOSTASIS (ARGON PLASMA COAGULATION/BICAP);  Surgeon: Irene Shipper, MD;  Location: [facility] ENDOSCOPY;  Service: Endoscopy;  Laterality: N/A;   HOT HEMOSTASIS N/A 01/25/2021   Procedure: HOT HEMOSTASIS (ARGON PLASMA COAGULATION/BICAP);  Surgeon: Doran Stabler, MD;  Location: [place];  Service: Gastroenterology;  Laterality: N/A;   HOT HEMOSTASIS N/A 02/13/2021   Procedure: HOT HEMOSTASIS (ARGON PLASMA COAGULATION/BICAP);  Surgeon: Jackquline Denmark, MD;  Location: [facility] ENDOSCOPY;  Service: Endoscopy;  Laterality: N/A;   INTERTROCHANTERIC HIP FRACTURE SURGERY Left 11/2006   [name] 03/08/2011   INTRAVASCULAR ULTRASOUND/IVUS N/A 12/19/2019   Procedure: Intravascular Ultrasound/IVUS;  Surgeon: Jettie Booze, MD;  Location: [place] CV LAB;  Service: Cardiovascular;  Laterality: N/A;   IR FLUORO GUIDE CV LINE RIGHT  07/24/2019   IR FLUORO GUIDE CV LINE RIGHT  07/30/2019   IR US GUIDE VASC ACCESS RIGHT  07/24/2019   IR US GUIDE VASC ACCESS RIGHT  07/30/2019   LAPAROSCOPIC CHOLECYSTECTOMY  05/2006   LIGATION OF COMPETING BRANCHES OF ARTERIOVENOUS FISTULA Left 11/05/2018   Procedure: LIGATION OF COMPETING BRANCHES OF ARTERIOVENOUS FISTULA  LEFT  ARM;  Surgeon: Marty Heck, MD;  Location: [place];  Service: Vascular;  Laterality: Left;   LUMBAR LAMINECTOMY/DECOMPRESSION MICRODISCECTOMY N/A 02/29/2016   Procedure: Left L4-5 Lateral Recess Decompression, Removal Extradural Intraspinal Facet Cyst;  Surgeon: Marybelle Killings, MD;  Location: [place];  Service: Orthopedics;  Laterality: N/A;   MULTIPLE TOOTH EXTRACTIONS     ORIF MANDIBULAR FRACTURE Left 08/13/2004   ORIF of left body fracture mandible with KLS Martin 2.3-mm six hole/notes 03/08/2011   RIGHT/LEFT HEART CATH AND CORONARY/GRAFT ANGIOGRAPHY N/A 12/19/2019   Procedure: RIGHT/LEFT HEART CATH AND CORONARY/GRAFT ANGIOGRAPHY;  Surgeon: Jettie Booze, MD;  Location: [place] CV LAB;  Service: Cardiovascular;  Laterality: N/A;   SCLEROTHERAPY  12/22/2019   Procedure: SCLEROTHERAPY;  Surgeon: Lavena Bullion, DO;  Location: [facility] ENDOSCOPY;  Service: Gastroenterology;;   Clide Deutscher  02/13/2021   Procedure: Clide Deutscher;  Surgeon: Jackquline Denmark, MD;  Location: [facility] ENDOSCOPY;  Service: Endoscopy;;       Family History  Problem Relation Age of Onset   Heart failure Father    Hypertension Father    Diabetes Brother    Heart attack Brother    Alzheimer's disease Mother    Stroke Sister    Diabetes Sister    Alzheimer's disease Sister    Hypertension Brother    Diabetes Brother    Drug abuse Brother    Colon cancer Neg Hx     Social History   Tobacco Use   Smoking status: Every Day    Packs/day: 0.50    Years: 43.00    Pack years: 21.50    Types: Cigarettes   Smokeless tobacco: Never  Vaping Use   Vaping Use: Never used  Substance Use Topics   Alcohol use: Not Currently    Alcohol/week: 12.0 standard drinks    Types: 12 Standard drinks or equivalent per week    Comment: occassional   Drug use: Not Currently    Types: Cocaine    Comment: hx of crack/cocaine 30yrs ago 10/01/2019- none    Home Medications Prior to Admission medications   Medication Sig Start Date End Date Taking? Authorizing Provider  acetaminophen (TYLENOL) 650 MG CR tablet Take 1,300 mg by mouth 3 (three) times daily.   Yes [provider]  allopurinol (ZYLOPRIM) 100 MG tablet TAKE 1 TABLET(100 MG) BY MOUTH DAILY 01/12/21  Yes [name], Dinah C, NP  ANORO ELLIPTA 62.5-25 MCG/INH AEPB INHALE 1 PUFF BY MOUTH EVERY DAY Patient taking differently: Inhale 1 puff into the lungs daily. 12/21/20  Yes [name], NP  aspirin EC 81 MG tablet Take 81 mg by mouth daily. Swallow whole.   Yes [provider]  atorvastatin (LIPITOR) 10 MG tablet Take 1 tablet (10 mg total) by mouth daily. 03/02/21 05/31/21 Yes Dunn, Dayna N, PA-C   bictegravir-emtricitabine-tenofovir AF (BIKTARVY) 50-200-25 MG TABS tablet Take 1 tablet by mouth daily. 06/30/20  Yes [name], MD  Calcium Acetate 667 MG TABS Take [phone] mg by mouth See admin instructions. Take 1,334 mg by mouth three times a day with meals and 667 mg with each snack 01/20/21  Yes [provider]  carvedilol (COREG) 3.125 MG tablet TAKE 1 TABLET(3.125 MG) BY MOUTH TWICE DAILY Patient taking differently: Take 3.125 mg by mouth 2 (two) times daily. 04/14/21  Yes [name], MD  Cinacalcet HCl (SENSIPAR PO) Take 1 tablet by mouth 3 (three) times a week. At dialysis 08/14/20 08/13/21 Yes [provider]  colchicine 0.6 MG tablet TAKE 1 TABLET BY MOUTH AS NEEDED FOR GOUT Patient taking differently: Take 0.6 mg by mouth as needed (gout flares). 04/14/21  Yes [name], Dinah C, NP  diclofenac Sodium (VOLTAREN) 1 % GEL APPLY 2 GRAMS TOPICALLY TO THE AFFECTED AREA THREE TIMES DAILY AS NEEDED FOR PAIN 04/14/21  Yes [name], NP  folic acid (FOLVITE) 1 MG tablet Take 1 tablet (1 mg total) by mouth daily. 01/12/21  Yes [name], Dinah C, NP  gabapentin (NEURONTIN) 300 MG capsule Take 1 capsule (300 mg total) by mouth daily. Dose change due to renal function 12/09/20  Yes Eubanks, [name], NP  insulin lispro (HUMALOG) 100 UNIT/ML KwikPen INJECT 20 UNITS UNDER THE SKIN THREE TIMES DAILY AS NEEDED FOR HIGH BLOOD SUGAR(ABOVE 150) 02/01/21  Yes [name], NP  isosorbide mononitrate (IMDUR) 30 MG 24 hr tablet TAKE 1 TABLET BY MOUTH EVERY DAY 01/12/21  Yes [name], Dinah C, NP  latanoprost (XALATAN) 0.005 % ophthalmic solution Place 1 drop into both eyes at bedtime.   Yes [provider]  lidocaine-prilocaine (EMLA) cream Apply 1 application topically every Monday, Wednesday, and Friday with hemodialysis.   Yes [provider]  Methoxy PEG-Epoetin Beta (MIRCERA IJ) Mircera 12/25/20  Yes [provider]  multivitamin (RENA-VIT) TABS  tablet Take 1 tablet by mouth daily.   Yes [provider]  nitroGLYCERIN (NITROSTAT) 0.3 MG SL tablet PLACE 1 TABLET UNDER THE TONGUE AS NEEDED FOR CHEST PAIN 04/14/21  Yes [name], MD  oxyCODONE-acetaminophen (PERCOCET/ROXICET) 5-325 MG tablet Take 1 tablet by mouth daily as needed for severe pain. 04/28/21  Yes Magnant, Charles L, PA-C  polyethylene glycol (MIRALAX / GLYCOLAX) 17 g packet Take 17 g by mouth daily as needed for mild constipation. 11/07/20  Yes Aline August, MD  timolol (TIMOPTIC) 0.5 % ophthalmic solution Place 1 drop into both eyes in the morning. 04/04/19  Yes [provider]  amoxicillin (AMOXIL) 500 MG tablet Take 1 tablet (500 mg total) by mouth 2 (two) times daily. Patient not taking: No sig reported 04/08/21   Kuppelweiser, Cassie L, RPH-CPP  B-D UF III MINI PEN NEEDLES 31G X 5 MM MISC USE FOUR TIMES DAILY 03/02/20   [name], NP  clarithromycin (BIAXIN) 500 MG tablet Take 1 tablet (500 mg total) by mouth daily. Patient not taking: No sig reported 04/08/21   Kuppelweiser, Cassie L, RPH-CPP  glucose blood (ONETOUCH ULTRA) test strip Check blood sugar three times daily. Dx:E11.40 03/02/21   [name], NP  Lancets [facility] DELICA PLUS [id number]) MISC Inject 1 Device as directed 3 (three) times daily. Dx: E11.40 09/18/19   [name], NP  misoprostol (CYTOTEC) 100 MCG tablet Take 1 tablet (100 mcg total) by mouth every 6 (six) hours. Patient not taking: Reported on 05/02/2021 02/16/21   Thurnell Lose, MD  pantoprazole (PROTONIX) 40 MG tablet Take 1 tablet (40 mg total) by mouth 2 (two) times daily before a meal. Patient not taking: Reported on 05/02/2021 02/16/21   Thurnell Lose, MD  sodium chloride irrigation 0.9 % irrigation Cleanse right foot ulcer with saline once daily. Patient not taking: Reported on 05/02/2021 03/19/21   Marzetta Board, DPM    Allergies    Augmentin [amoxicillin-pot clavulanate], Mucinex fast-max,  and Amphetamines  Review of Systems   Review of Systems  Constitutional:  Negative for chills and fever.  HENT:  Negative for congestion.   Eyes:  Negative for visual disturbance.  Respiratory:  Negative for shortness of breath.   Cardiovascular:  Negative for chest pain.  Gastrointestinal:  Positive for abdominal pain and nausea. Negative for diarrhea and vomiting.  Genitourinary:  Negative for dysuria.  Skin:  Negative for rash.  Neurological:  Negative for headaches.   Physical Exam Updated Vital Signs BP (!) 149/71   Pulse 84   Temp 98.1 F (36.7 C) (Oral)   Resp 16   SpO2 98%   Physical Exam Vitals and nursing note reviewed.  Constitutional:      General: He is not in acute distress.    Appearance: Normal appearance.  HENT:     Head: Normocephalic.     Mouth/Throat:     Mouth: Mucous membranes are moist.  Cardiovascular:     Rate and Rhythm: Normal rate.  Pulmonary:     Effort: Pulmonary effort is normal. No respiratory distress.  Abdominal:     Palpations: Abdomen is soft.     Tenderness: There is no guarding.     Comments: Mild mid abdominal TTP  Skin:    General: Skin is warm.  Neurological:     Mental Status: He is alert and oriented to person, place, and time. Mental status is at baseline.  Psychiatric:        Mood and Affect: Mood normal.    ED Results / Procedures / Treatments   Labs (all labs ordered are listed, but only abnormal results are displayed) Labs Reviewed  LIPASE, BLOOD - Abnormal; Notable for the following components:      Result Value   Lipase 87 (*)    All other components within normal limits  COMPREHENSIVE METABOLIC PANEL - Abnormal; Notable for the following components:   Chloride 93 (*)    Glucose, Bld 165 (*)    BUN 103 (*)    Creatinine, Ser 7.65 (*)    Calcium 8.6 (*)    Albumin 3.1 (*)    GFR, Estimated 7 (*)    All other components within normal limits  CBC - Abnormal; Notable for the following components:   RBC  3.41 (*)    Hemoglobin 9.5 (*)    HCT 31.0 (*)    RDW 19.2 (*)    Platelets 124 (*)    All other components within normal limits  URINALYSIS, ROUTINE W REFLEX MICROSCOPIC    EKG EKG Interpretation  Date/Time:  Sunday May 02 2021 07:41:56 EDT Ventricular Rate:  79 PR Interval:  176 QRS Duration: 148 QT Interval:  472 QTC Calculation: 541 R Axis:   -48 Text Interpretation: Normal sinus rhythm Left axis deviation Right bundle branch block Inferior infarct , age undetermined Abnormal ECG Similar to previous Confirmed by Lavenia Atlas [phone]) on 05/02/2021 8:11:32 AM  Radiology No results found.  Procedures Procedures   Medications Ordered in ED Medications  famotidine (PEPCID) IVPB 20 mg premix (20 mg Intravenous Not Given 05/02/21 1019)  ondansetron (ZOFRAN) injection 4 mg (4 mg Intravenous Not Given 05/02/21 1020)    ED Course  I have reviewed the triage vital signs and the nursing notes.  Pertinent labs & imaging results that were available during my care of the patient were reviewed by me and considered in my medical decision making (see chart for details).    MDM Rules/Calculators/A&P  70 year old male presents the emergency department with abdominal pain.  There was mention of shortness of breath when EMS picked him up however patient denies that here.  He denies any chest pain, shortness of breath.  He is not dyspneic on exam.  Vitals are normal.  Patient complains of generalized mainly upper abdominal pain.  Benign exam.  Blood work is baseline, lipase is slightly elevated however this is baseline as well.  CT of the abdomen pelvis shows no findings of acute pancreatitis, does identify severe atherosclerotic disease involving the SMA and renal arteries, this is a known finding for the patient.  He is recently had a CTA, appears that he has collateral blood flow.  Patient educated on these findings and the need for outpatient follow-up.  He has  been able to eat and drink without any nausea/vomiting.  Patient will be discharged and treated as an outpatient.  Discharge plan and strict return to ED precautions discussed, patient verbalizes understanding and agreement.  Final Clinical Impression(s) / ED Diagnoses Final diagnoses:  Abdominal pain, unspecified abdominal location    Rx / DC Orders ED Discharge Orders     None        Lorelle Gibbs, DO 05/02/21 1524

## 2021-05-03 ENCOUNTER — Observation Stay (HOSPITAL_COMMUNITY)
Admission: EM | Admit: 2021-05-03 | Discharge: 2021-05-04 | Disposition: A | Discharge status: Home / Self Care | Payer: Medicare Other | Source: Home / Self Care | Attending: Emergency Medicine | Admitting: Emergency Medicine

## 2021-05-03 ENCOUNTER — Encounter (HOSPITAL_COMMUNITY)
Admission: EM | Disposition: A | Discharge status: Home / Self Care | Payer: Self-pay | Source: Home / Self Care | Attending: Emergency Medicine

## 2021-05-03 ENCOUNTER — Emergency Department (HOSPITAL_COMMUNITY): Payer: Medicare Other

## 2021-05-03 ENCOUNTER — Other Ambulatory Visit: Payer: Medicare Other

## 2021-05-03 ENCOUNTER — Encounter (HOSPITAL_COMMUNITY): Payer: Self-pay | Admitting: Anesthesiology

## 2021-05-03 ENCOUNTER — Encounter (HOSPITAL_COMMUNITY): Payer: Self-pay | Admitting: Emergency Medicine

## 2021-05-03 DIAGNOSIS — I132 Hypertensive heart and chronic kidney disease with heart failure and with stage 5 chronic kidney disease, or end stage renal disease: Secondary | ICD-10-CM | POA: Insufficient documentation

## 2021-05-03 DIAGNOSIS — K922 Gastrointestinal hemorrhage, unspecified: Secondary | ICD-10-CM | POA: Diagnosis not present

## 2021-05-03 DIAGNOSIS — K921 Melena: Secondary | ICD-10-CM

## 2021-05-03 DIAGNOSIS — G9340 Encephalopathy, unspecified: Secondary | ICD-10-CM

## 2021-05-03 DIAGNOSIS — Z7982 Long term (current) use of aspirin: Secondary | ICD-10-CM | POA: Insufficient documentation

## 2021-05-03 DIAGNOSIS — F17210 Nicotine dependence, cigarettes, uncomplicated: Secondary | ICD-10-CM | POA: Insufficient documentation

## 2021-05-03 DIAGNOSIS — N186 End stage renal disease: Secondary | ICD-10-CM | POA: Diagnosis not present

## 2021-05-03 DIAGNOSIS — R579 Shock, unspecified: Secondary | ICD-10-CM | POA: Diagnosis not present

## 2021-05-03 DIAGNOSIS — E1122 Type 2 diabetes mellitus with diabetic chronic kidney disease: Secondary | ICD-10-CM | POA: Insufficient documentation

## 2021-05-03 DIAGNOSIS — I5040 Unspecified combined systolic (congestive) and diastolic (congestive) heart failure: Secondary | ICD-10-CM | POA: Diagnosis not present

## 2021-05-03 DIAGNOSIS — I252 Old myocardial infarction: Secondary | ICD-10-CM | POA: Insufficient documentation

## 2021-05-03 DIAGNOSIS — Z20822 Contact with and (suspected) exposure to covid-19: Secondary | ICD-10-CM | POA: Insufficient documentation

## 2021-05-03 DIAGNOSIS — E1129 Type 2 diabetes mellitus with other diabetic kidney complication: Secondary | ICD-10-CM

## 2021-05-03 DIAGNOSIS — A048 Other specified bacterial intestinal infections: Secondary | ICD-10-CM

## 2021-05-03 DIAGNOSIS — Z539 Procedure and treatment not carried out, unspecified reason: Secondary | ICD-10-CM | POA: Insufficient documentation

## 2021-05-03 DIAGNOSIS — Z992 Dependence on renal dialysis: Secondary | ICD-10-CM

## 2021-05-03 DIAGNOSIS — D696 Thrombocytopenia, unspecified: Secondary | ICD-10-CM | POA: Insufficient documentation

## 2021-05-03 DIAGNOSIS — Z951 Presence of aortocoronary bypass graft: Secondary | ICD-10-CM | POA: Insufficient documentation

## 2021-05-03 DIAGNOSIS — I5042 Chronic combined systolic (congestive) and diastolic (congestive) heart failure: Secondary | ICD-10-CM | POA: Insufficient documentation

## 2021-05-03 DIAGNOSIS — K31811 Angiodysplasia of stomach and duodenum with bleeding: Secondary | ICD-10-CM

## 2021-05-03 DIAGNOSIS — Z21 Asymptomatic human immunodeficiency virus [HIV] infection status: Secondary | ICD-10-CM

## 2021-05-03 DIAGNOSIS — Z794 Long term (current) use of insulin: Secondary | ICD-10-CM | POA: Insufficient documentation

## 2021-05-03 DIAGNOSIS — Z955 Presence of coronary angioplasty implant and graft: Secondary | ICD-10-CM | POA: Insufficient documentation

## 2021-05-03 DIAGNOSIS — N2581 Secondary hyperparathyroidism of renal origin: Secondary | ICD-10-CM | POA: Diagnosis not present

## 2021-05-03 DIAGNOSIS — I4891 Unspecified atrial fibrillation: Secondary | ICD-10-CM | POA: Insufficient documentation

## 2021-05-03 DIAGNOSIS — D631 Anemia in chronic kidney disease: Secondary | ICD-10-CM | POA: Insufficient documentation

## 2021-05-03 DIAGNOSIS — K625 Hemorrhage of anus and rectum: Secondary | ICD-10-CM | POA: Diagnosis not present

## 2021-05-03 DIAGNOSIS — J449 Chronic obstructive pulmonary disease, unspecified: Secondary | ICD-10-CM | POA: Insufficient documentation

## 2021-05-03 DIAGNOSIS — I2510 Atherosclerotic heart disease of native coronary artery without angina pectoris: Secondary | ICD-10-CM | POA: Insufficient documentation

## 2021-05-03 LAB — GLUCOSE, CAPILLARY
Glucose-Capillary: 168 mg/dL — ABNORMAL HIGH (ref 70–99)
Glucose-Capillary: 143 mg/dL — ABNORMAL HIGH (ref 70–99)
Glucose-Capillary: 108 mg/dL — ABNORMAL HIGH (ref 70–99)
Glucose-Capillary: 138 mg/dL — ABNORMAL HIGH (ref 70–99)
Glucose-Capillary: 160 mg/dL — ABNORMAL HIGH (ref 70–99)
Glucose-Capillary: 114 mg/dL — ABNORMAL HIGH (ref 70–99)

## 2021-05-03 LAB — LACTIC ACID, PLASMA: Lactic Acid, Venous: 2.3 mmol/L (ref 0.5–1.9)

## 2021-05-03 LAB — I-STAT CHEM 8, ED
BUN: >130 mg/dL — ABNORMAL HIGH (ref 8–23)
Calcium, Ion: 0.98 mmol/L — ABNORMAL LOW (ref 1.15–1.40)
Chloride: 99 mmol/L (ref 98–111)
Creatinine, Ser: 9.4 mg/dL — ABNORMAL HIGH (ref 0.61–1.24)
Glucose, Bld: 169 mg/dL — ABNORMAL HIGH (ref 70–99)
HCT: 25 % — ABNORMAL LOW (ref 39.0–52.0)
Hemoglobin: 8.5 g/dL — ABNORMAL LOW (ref 13.0–17.0)
Potassium: 4.8 mmol/L (ref 3.5–5.1)
Sodium: 138 mmol/L (ref 135–145)
TCO2: 24 mmol/L (ref 22–32)

## 2021-05-03 LAB — RESP PANEL BY RT-PCR (FLU A&B, COVID) ARPGX2
Influenza A by PCR: NEGATIVE
Influenza B by PCR: NEGATIVE
SARS Coronavirus 2 by RT PCR: NEGATIVE

## 2021-05-03 LAB — COMPREHENSIVE METABOLIC PANEL
ALT: 15 U/L (ref 0–44)
AST: 25 U/L (ref 15–41)
Albumin: 2.8 g/dL — ABNORMAL LOW (ref 3.5–5.0)
Alkaline Phosphatase: 31 U/L — ABNORMAL LOW (ref 38–126)
Anion gap: 20 — ABNORMAL HIGH (ref 5–15)
BUN: 157 mg/dL — ABNORMAL HIGH (ref 8–23)
CO2: 21 mmol/L — ABNORMAL LOW (ref 22–32)
Calcium: 8.1 mg/dL — ABNORMAL LOW (ref 8.9–10.3)
Chloride: 97 mmol/L — ABNORMAL LOW (ref 98–111)
Creatinine, Ser: 8.8 mg/dL — ABNORMAL HIGH (ref 0.61–1.24)
GFR, Estimated: 6 mL/min — ABNORMAL LOW (ref >60)
Glucose, Bld: 170 mg/dL — ABNORMAL HIGH (ref 70–99)
Potassium: 5.2 mmol/L — ABNORMAL HIGH (ref 3.5–5.1)
Sodium: 138 mmol/L (ref 135–145)
Total Bilirubin: 0.7 mg/dL (ref 0.3–1.2)
Total Protein: 6 g/dL — ABNORMAL LOW (ref 6.5–8.1)

## 2021-05-03 LAB — CBC WITH DIFFERENTIAL/PLATELET
Abs Immature Granulocytes: 0.03 10*3/uL (ref 0.00–0.07)
Basophils Absolute: 0 10*3/uL (ref 0.0–0.1)
Basophils Relative: 1 %
Eosinophils Absolute: 0.1 10*3/uL (ref 0.0–0.5)
Eosinophils Relative: 1 %
HCT: 22.7 % — ABNORMAL LOW (ref 39.0–52.0)
Hemoglobin: 7 g/dL — ABNORMAL LOW (ref 13.0–17.0)
Immature Granulocytes: 1 %
Lymphocytes Relative: 17 %
Lymphs Abs: 1.1 10*3/uL (ref 0.7–4.0)
MCH: 28.5 pg (ref 26.0–34.0)
MCHC: 30.8 g/dL (ref 30.0–36.0)
MCV: 92.3 fL (ref 80.0–100.0)
Monocytes Absolute: 0.6 10*3/uL (ref 0.1–1.0)
Monocytes Relative: 9 %
Neutro Abs: 4.7 10*3/uL (ref 1.7–7.7)
Neutrophils Relative %: 71 %
Platelets: 101 10*3/uL — ABNORMAL LOW (ref 150–400)
RBC: 2.46 MIL/uL — ABNORMAL LOW (ref 4.22–5.81)
RDW: 19.5 % — ABNORMAL HIGH (ref 11.5–15.5)
WBC: 6.5 10*3/uL (ref 4.0–10.5)
nRBC: 0 % (ref 0.0–0.2)

## 2021-05-03 LAB — CBC
HCT: 22.2 % — ABNORMAL LOW (ref 39.0–52.0)
Hemoglobin: 7.2 g/dL — ABNORMAL LOW (ref 13.0–17.0)
MCH: 28.9 pg (ref 26.0–34.0)
MCHC: 32.4 g/dL (ref 30.0–36.0)
MCV: 89.2 fL (ref 80.0–100.0)
Platelets: 80 10*3/uL — ABNORMAL LOW (ref 150–400)
RBC: 2.49 MIL/uL — ABNORMAL LOW (ref 4.22–5.81)
RDW: 17.3 % — ABNORMAL HIGH (ref 11.5–15.5)
WBC: 5.5 10*3/uL (ref 4.0–10.5)
nRBC: 0 % (ref 0.0–0.2)

## 2021-05-03 LAB — BASIC METABOLIC PANEL
Anion gap: 11 (ref 5–15)
BUN: 96 mg/dL — ABNORMAL HIGH (ref 8–23)
CO2: 27 mmol/L (ref 22–32)
Calcium: 8 mg/dL — ABNORMAL LOW (ref 8.9–10.3)
Chloride: 98 mmol/L (ref 98–111)
Creatinine, Ser: 5.98 mg/dL — ABNORMAL HIGH (ref 0.61–1.24)
GFR, Estimated: 9 mL/min — ABNORMAL LOW (ref >60)
Glucose, Bld: 147 mg/dL — ABNORMAL HIGH (ref 70–99)
Potassium: 4.4 mmol/L (ref 3.5–5.1)
Sodium: 136 mmol/L (ref 135–145)

## 2021-05-03 LAB — POC OCCULT BLOOD, ED: Fecal Occult Bld: POSITIVE — AB

## 2021-05-03 LAB — PROTIME-INR
INR: 1.3 — ABNORMAL HIGH (ref 0.8–1.2)
Prothrombin Time: 15.8 seconds — ABNORMAL HIGH (ref 11.4–15.2)

## 2021-05-03 LAB — MRSA NEXT GEN BY PCR, NASAL: MRSA by PCR Next Gen: NOT DETECTED

## 2021-05-03 LAB — TROPONIN I (HIGH SENSITIVITY): Troponin I (High Sensitivity): 33 ng/L — ABNORMAL HIGH (ref <18)

## 2021-05-03 LAB — HEMOGLOBIN A1C
Hgb A1c MFr Bld: 5.8 % — ABNORMAL HIGH (ref 4.8–5.6)
Mean Plasma Glucose: 119.76 mg/dL

## 2021-05-03 LAB — MAGNESIUM: Magnesium: 2.1 mg/dL (ref 1.7–2.4)

## 2021-05-03 LAB — PREPARE RBC (CROSSMATCH)

## 2021-05-03 LAB — PHOSPHORUS: Phosphorus: 4.2 mg/dL (ref 2.5–4.6)

## 2021-05-03 SURGERY — LEFT HEART CATH AND CORONARY ANGIOGRAPHY
Anesthesia: LOCAL

## 2021-05-03 MED ORDER — TIMOLOL MALEATE 0.5 % OP SOLN
1.0000 [drp] | Freq: Every day | OPHTHALMIC | Status: DC
  Administered 2021-05-03: 1 [drp] via OPHTHALMIC
  Filled 2021-05-03: qty 5

## 2021-05-03 MED ORDER — BICTEGRAVIR-EMTRICITAB-TENOFOV 50-200-25 MG PO TABS
1.0000 | ORAL_TABLET | Freq: Every day | ORAL | Status: DC
  Administered 2021-05-03: 1 via ORAL
  Filled 2021-05-03 (×2): qty 1

## 2021-05-03 MED ORDER — CHLORHEXIDINE GLUCONATE CLOTH 2 % EX PADS
6.0000 | MEDICATED_PAD | Freq: Every day | CUTANEOUS | Status: DC
  Administered 2021-05-03 – 2021-05-04 (×2): 6 via TOPICAL

## 2021-05-03 MED ORDER — SODIUM CHLORIDE 0.9 % IV SOLN
50.0000 ug/h | INTRAVENOUS | Status: DC
  Administered 2021-05-03 – 2021-05-04 (×3): 50 ug/h via INTRAVENOUS
  Filled 2021-05-03 (×6): qty 1

## 2021-05-03 MED ORDER — HYDROMORPHONE HCL 1 MG/ML IJ SOLN
0.5000 mg | INTRAMUSCULAR | Status: AC | PRN
  Administered 2021-05-04 (×2): 0.5 mg via INTRAVENOUS
  Filled 2021-05-03 (×2): qty 1

## 2021-05-03 MED ORDER — LATANOPROST 0.005 % OP SOLN
1.0000 [drp] | Freq: Every day | OPHTHALMIC | Status: DC
  Administered 2021-05-03: 1 [drp] via OPHTHALMIC
  Filled 2021-05-03: qty 2.5

## 2021-05-03 MED ORDER — ONDANSETRON HCL 4 MG/2ML IJ SOLN: 4.0000 mg | Freq: Four times a day (QID) | INTRAMUSCULAR | Status: DC | PRN

## 2021-05-03 MED ORDER — FENTANYL CITRATE (PF) 100 MCG/2ML IJ SOLN
25.0000 ug | Freq: Once | INTRAMUSCULAR | Status: AC
  Administered 2021-05-03: 25 ug via INTRAVENOUS
  Filled 2021-05-03: qty 2

## 2021-05-03 MED ORDER — CHLORHEXIDINE GLUCONATE CLOTH 2 % EX PADS
6.0000 | MEDICATED_PAD | Freq: Every day | CUTANEOUS | Status: DC
  Administered 2021-05-03: 6 via TOPICAL

## 2021-05-03 MED ORDER — SODIUM CHLORIDE 0.9 % IV SOLN: 100.0000 mL | INTRAVENOUS | Status: DC | PRN

## 2021-05-03 MED ORDER — SODIUM CHLORIDE 0.9 % IV BOLUS
250.0000 mL | Freq: Once | INTRAVENOUS | Status: AC
  Administered 2021-05-03: 250 mL via INTRAVENOUS

## 2021-05-03 MED ORDER — PANTOPRAZOLE SODIUM 40 MG IV SOLR: 40.0000 mg | Freq: Two times a day (BID) | INTRAVENOUS | Status: DC

## 2021-05-03 MED ORDER — LIDOCAINE HCL (PF) 1 % IJ SOLN: 5.0000 mL | INTRAMUSCULAR | Status: DC | PRN

## 2021-05-03 MED ORDER — ALBUMIN HUMAN 25 % IV SOLN
INTRAVENOUS | Status: AC
  Administered 2021-05-03: 25 g via INTRAVENOUS
  Filled 2021-05-03: qty 100

## 2021-05-03 MED ORDER — POLYETHYLENE GLYCOL 3350 17 G PO PACK: 17.0000 g | PACK | Freq: Every day | ORAL | Status: DC | PRN

## 2021-05-03 MED ORDER — ALBUMIN HUMAN 25 % IV SOLN: 25.0000 g | Freq: Once | INTRAVENOUS | Status: AC

## 2021-05-03 MED ORDER — UMECLIDINIUM-VILANTEROL 62.5-25 MCG/INH IN AEPB
1.0000 | INHALATION_SPRAY | Freq: Every day | RESPIRATORY_TRACT | Status: DC
  Administered 2021-05-03 – 2021-05-04 (×2): 1 via RESPIRATORY_TRACT
  Filled 2021-05-03: qty 14

## 2021-05-03 MED ORDER — HEPARIN SODIUM (PORCINE) 1000 UNIT/ML DIALYSIS: 1000.0000 [IU] | INTRAMUSCULAR | Status: DC | PRN

## 2021-05-03 MED ORDER — SODIUM CHLORIDE 0.9 % IV SOLN
10.0000 mL/h | Freq: Once | INTRAVENOUS | Status: AC
  Administered 2021-05-03: 10 mL/h via INTRAVENOUS

## 2021-05-03 MED ORDER — PENTAFLUOROPROP-TETRAFLUOROETH EX AERO: 1.0000 "application " | INHALATION_SPRAY | CUTANEOUS | Status: DC | PRN

## 2021-05-03 MED ORDER — ATORVASTATIN CALCIUM 10 MG PO TABS
10.0000 mg | ORAL_TABLET | Freq: Every day | ORAL | Status: DC
  Administered 2021-05-03: 10 mg via ORAL
  Filled 2021-05-03: qty 1

## 2021-05-03 MED ORDER — ALTEPLASE 2 MG IJ SOLR: 2.0000 mg | Freq: Once | INTRAMUSCULAR | Status: DC | PRN

## 2021-05-03 MED ORDER — INSULIN ASPART 100 UNIT/ML IJ SOLN
0.0000 [IU] | INTRAMUSCULAR | Status: DC
  Administered 2021-05-03: 2 [IU] via SUBCUTANEOUS
  Administered 2021-05-03 (×2): 3 [IU] via SUBCUTANEOUS
  Administered 2021-05-03: 2 [IU] via SUBCUTANEOUS

## 2021-05-03 MED ORDER — DOCUSATE SODIUM 100 MG PO CAPS: 100.0000 mg | ORAL_CAPSULE | Freq: Two times a day (BID) | ORAL | Status: DC | PRN

## 2021-05-03 MED ORDER — PANTOPRAZOLE INFUSION (NEW) - SIMPLE MED
8.0000 mg/h | INTRAVENOUS | Status: DC
  Administered 2021-05-03 – 2021-05-04 (×4): 8 mg/h via INTRAVENOUS
  Filled 2021-05-03 (×4): qty 80
  Filled 2021-05-03: qty 100

## 2021-05-03 MED ORDER — LIDOCAINE-PRILOCAINE 2.5-2.5 % EX CREA
1.0000 "application " | TOPICAL_CREAM | CUTANEOUS | Status: DC | PRN
  Filled 2021-05-03: qty 5

## 2021-05-03 MED ORDER — PANTOPRAZOLE 80MG IVPB - SIMPLE MED
80.0000 mg | Freq: Once | INTRAVENOUS | Status: AC
  Administered 2021-05-03: 80 mg via INTRAVENOUS
  Filled 2021-05-03: qty 80

## 2021-05-03 MED ORDER — SODIUM CHLORIDE 0.9 % IV SOLN
20.0000 ug | Freq: Once | INTRAVENOUS | Status: AC
  Administered 2021-05-03: 20 ug via INTRAVENOUS
  Filled 2021-05-03: qty 5

## 2021-05-03 NOTE — Progress Notes (Signed)
NAME:  Jacob [name], MRN:  [id number], DOB:  Dec 22, 1950, LOS: 0 ADMISSION DATE:  05/03/2021, CONSULTATION DATE:  05/03/2021 REFERRING MD:  Veatrice Kells - EDP CHIEF COMPLAINT:  Hematemesis/Melena  History of Present Illness:  Jacob [name] is an 70 y.o. who presented to [facility] ED 7/11 with c/o blood in emesis and stool.  PMHx significant for HTN, T2DM, COPD, combined systolic/diastolic CHF, CAD with MI [2004/2005, s/p CABG x 2 (2012) and DES x 1 (2021)], ESRD (on HD MWF), GERD with recent H. Pylori diagnosis and GI bleeding (s/p multiple EGD), HIV (well-controlled, diagnoses 1995), remote Hepatitis B (1968), gout, glaucoma.  Initially presented to [facility] ED 7/10AM via EMS with c/o SOB, epigastric pain and brown emesis. Lipase was elevated at 87 (appeared to be chronic). CT A/P was obtained demonstrating atherosclerotic disease involving the SMA and renal arteries. Patient able to eat and drink and was not dyspneic on exam in the ED; subsequently discharged home.  Patient returned to ED via EMS 7/10PM with bloody emesis and melena. Reported one episode of bloody/dark emesis, 3 black bowel movements and LUQ pain.   ED workup was significant for a +FOBT, Hgb of 7, Plt 101, lactate 2.3, AG 20, INR 1.3. CXR negative for infiltrate. CT A/P demonstrated extensive atherosclerosis and sigmoid diverticulosis, no other acute findings. COVID -, BCx pending, 2U PRBC was ordered with appropriate response. Protonix infusion, octreotide and DDAVP ordered.  PCCM was consulted for admission.  Pertinent Medical History:  Chronic combined systolic and diastolic CHF, COPD, CAD, CABG x2 2012, Coronary stent placement 2021, ESRD MWF dialysis, recent H. Pylori diagnosis, GERD, GI bleeding, EGD (in 2020, 21 (x2), 22 (X2)) Glaucoma, Hepatitis B (1968), gout,  HTN , HIV (dx'd 1995), Myocardial infarction  (~ 2004/2005), DM2  Significant Hospital Events: Including procedures, antibiotic start and stop dates in addition to  other pertinent events   7/11 Admit. CT A/P with sigmoid diverticulosis, no other acute findings. 2U PRBC with appropriate response. BCx pending. Nephro c/s for HD, GI c/s for ?EGD. Somnolent.  Interim History / Subjective:  No significant events overnight Received 2U PRBCs for Hgb 7.0 with appropriate response Drowsy this morning, dozing off during conversation but answering questions appropriately Denies fever/chills, CP/SOB, n/v or abdominal pain Reports that he did go to dialysis Friday (7/8) per usual schedule  Objective   Blood pressure (!) 139/55, pulse 74, temperature 98.6 F (37 C), temperature source Oral, resp. rate 12, weight 74.6 kg, SpO2 99 %. On RA        Intake/Output Summary (Last 24 hours) at 05/03/2021 0917 Last data filed at 05/03/2021 0800 Gross per 24 hour  Intake 1337.01 ml  Output --  Net 1337.01 ml   Filed Weights   05/03/21 0500  Weight: 74.6 kg   Physical Examination: General: Chronically ill-appearing elderly gentleman, in NAD. HEENT: [place]/AT, anicteric sclera, PERRL, moist mucous membranes. Neuro:  Drowsy, but wakes easily to voice. Answering questions appropriately but dozing off during conversation.  Responds to verbal stimuli. Following commands consistently. Moves all 4 extremities spontaneously. CV: RRR, no m/g/r. PULM: Breathing even and unlabored on RA. Lung fields CTAB in upper fields, slight bibasilar crackles noted. GI: Soft, nontender, nondistended. Normoactive bowel sounds. Extremities: No LE edema noted. LUE AVF noted with +thrill, bruit. Skin: Warm/dry, no rashes.  Resolved Hospital Problem List:    Assessment & Plan:  Hemorrhagic shock in the setting of GIB?Upper vs lower, unclear etiology. +FOBT, Hgb 7 +H. Pylori (03/2021) History of  GERD, GIB Presented 7/10AM with SOB, brown emesis - later re-presented to ED with hematemesis/melena. Patient unfortunately has had several past occurrences of GIB with multiple EGDs. CT A/P 7/10  demonstrated sigmoid diverticulosis, no other acute findings. Query sequelae of H. Pylori infection, PUD vs. diverticular bleed. S/p 2U PRBCs 7/11AM and DDAVP. - GI consulted, appreciate recommendations - Continue octreotide, Protonix BID - Possible EGD, timing pending GI recs - S/p 2U PRBCs with appropriate bump in Hgb - Trend H&H, transfuse for Hgb < 7.0 - Trend LA - Goal MAP > 65  Acute Blood Loss Anemia Thrombocytopenia In the setting of GIB. Hgb 9.5 > 7.0, +FOBT. S/p 2U PRBCs since admission. - Trend CBC - Transfuse for Hgb < 7.0, Plt < 10K  Acute encephalopathy in the setting of hemorrhagic shock, ABLA, ESRD Drowsy/somnolent, likely multifactorial in the setting of the above. BUN increasing in the setting of GIB, HD need. - Trend BMP, specifically BUN in the setting of GIB - Hopeful for HD today, 7/11 - Monitor neurologic status closely  ESRD on hemodialysis Hyperkalemia Dialyzes at [facility] MWF via LUE AVF. EDW per last Nephrology note 75kg. Last session 7/8 prior to admission. - Nephrology consulted for dialysis, appreciate assistance - Plan for HD today, if able - Trend BMP - Monitor I&Os - Avoid nephrotoxic agents as able - Ensure adequate renal perfusion  HX CHF HX CAD, S/P CABG and stent placement HX HTN HX HLD - Holding AC/antiplatelet medications in the setting of active GIB - Hold home antihypertensives, given hypotension - Continue Lipitor - Resume medications as clinically appropriate  HX COPD - Continue Ellipta - DuoNebs PRN  HX HIV On Biktarvy 50-200-25 - Continue Biktarvy  DM2 - Continue SSI (Renal) - CBGs Q4H while NPO  Best Practice (right click and "Reselect all SmartList Selections" daily)   Diet/type: NPO DVT prophylaxis: SCD GI prophylaxis: PPI Lines: N/A Foley:  N/A Code Status:  full code Last date of multidisciplinary goals of care discussion [Pending]  Critical care time: 35 minutes   Lestine Mount, PA-C [facility] 05/03/21 9:17 AM  Please see [email or web address] for pager details.  From 7A-7P if no response, please call [phone] After hours, please call ELink [phone]

## 2021-05-03 NOTE — H&P (Addendum)
NAME:  Jacob [name], MRN:  [id number], DOB:  1951/09/17, LOS: 0 ADMISSION DATE:  05/03/2021, CONSULTATION DATE:  05/03/2021 REFERRING MD:  Randal Buba, CHIEF COMPLAINT:  blood in emesis and stool.  History of Present Illness:  Jacob [name] is an 70 y.o. who presented to the [facility] ED on 7/11 with c/o blood in emesis and stool.  History is somewhat limited due to patient lethargy requiring stimulation for exam and questions.  He has a pertinent medical history of Chronic combined systolic and diastolic CHF , COPD, CAD, CABG x2 2012, Coronary stent placement 2021, ESRD MWF dialysis, GERD, recent H. Pylori diagnosis, GI bleeding, EGD (in 2020, 21 (x2), 22 (X2)) Glaucoma, Hepatitis B (1968), gout,  HTN , HIV (dx'd 1995), Myocardial infarction  (~ 2004/2005), DM2.  He presented to the [facility] ED the morning of 7/10 by ems with complaints of SOB. He reported vomiting some brown emesis and epigastric pain. Lipase was elevated at 87 but appears to be chronically elevated. An abdominal CT scan was obtained which showed atherosclerotic disease involving the SMA and renal arteries. He was able to eat and drink and was no dyspneic on exam in the ED. He was discharged home.  He was brought in again by EMS the evening of 7/10 with complaints of bloody emesis and stool. It is reported that the patient had vomited one time on 7/10 with dark emesis and had 3 BM with black stool. He also reported complaints of LUQ pain.   ED workup was significant for a +FOBT, HGB of 7, PLT 101, lactate 2.3, AG 20, INR 1.3. CXR negative for infiltrate. COVID -, BC pending, CT abdomen and pelvis pending. 1 U PRBC was ordered. Protonix infusion and DDAVP ordered.  PCCM was consulted for admission.  Pertinent  Medical History  Chronic combined systolic and diastolic CHF, COPD, CAD, CABG x2 2012, Coronary stent placement 2021, ESRD MWF dialysis, recent H. Pylori diagnosis, GERD, GI bleeding, EGD (in 2020, 21 (x2), 22 (X2)) Glaucoma,  Hepatitis B (1968), gout,  HTN , HIV (dx'd 1995), Myocardial infarction  (~ 2004/2005), DM2  Significant Hospital Events: Including procedures, antibiotic start and stop dates in addition to other pertinent events   7/11 Admit. CT abd pelvis> 1 U PRBC. BC>  Interim History / Subjective:  See above  Subjective exam is somewhat limited due to patient lethargy requiring stimulation for exam and questions.  Denies chest pain and SOB. Endorses hematemesis and melena earlier today. Denies abdominal pain on examination.  Objective   Blood pressure (!) 84/64, pulse 93, temperature 97.7 F (36.5 C), temperature source Oral, resp. rate 17, SpO2 99 %. On RA        Intake/Output Summary (Last 24 hours) at 05/03/2021 0246 Last data filed at 05/03/2021 0142 Gross per 24 hour  Intake 350 ml  Output --  Net 350 ml   There were no vitals filed for this visit.  Examination: General:  in bed, chronically ill appearing, lethargic HEENT: MM pink/moist, anicteric, atraumatic Neuro: GCS 13, Eyes open to stimulation, follows commands, oriented, RASS -2, PERRL 66mm CV: S1S2, NSR, no m/r/g appreciated PULM:  clear in the upper lobes and in the lower lobes, Trachea midline, chest expansion symmetric GI: soft, bsx4 hypoactive, nontender   Extremities: warm/dry, no pretibial edema, capillary refill less than 3 seconds  Skin: LUE fistula, no rashes or lesions   Resolved Hospital Problem list     Assessment & Plan:  GI Bleed- ?Upper vs lower, unclear  etiology. +FOBT, Hgb 7 Shock, Hemorraghic- secondary to above Hx GERD Hx H Pylori Hx GIB CT scan 7/10 demonstrated diverticulosis. ? Diverticular bleed.  LFT WNL. -GI consulted. Appreciate assistance and workup -Admit to ICU  -2 U PRBC ordered by ED provider. Follow up HBG post transfusion. -Goal MAP greater than 65. Hope hypotension will improve post transfusion. -Continue NPO -Follow up CT ABD/Pelvis -Admit to ICU -Give 81mcg DDAVP -Start  octreotide gtt -Protonix infusion complete. On protonix BID. -Continue to monitor neuro and respiratory status closely -Follow up BC -Zofran for antiemetic  ESRD (MWF Dialysis) -Am Nephrology consult -Hopefully BP will improve post transfusion  Acute Blood Loss Anemia Thrombocytopenia In the setting of GIB HGB 9.5>7.0, +FOBT -Transfuse PRBC if HBG less than 7 -Obtain AM CBC to trend H&H -Goal PLT above 50K  Hyperkalemia K 5.2 on BMP. HX ESRD -Monitor on AM BMP -Hopefully HD today  HX CHF HX CAD, S/P CABG and stent placement HX HTN HX HLD -Holding anticoagulation and antiplatelet due to active bleeding -Holding on home antihypertensives in the setting of hypotension -Continue lipitor 10 -Reevaluate today for resumption of medications  HX COPD -PRN duonebs -Continue Ellepta  HX HIV On Biktarvy 50-200-25 -Continue biktarvy  DM2 -SSI renal -Blood Glucose goal 140-180.  Best Practice (right click and "Reselect all SmartList Selections" daily)   Diet/type: NPO DVT prophylaxis: SCD GI prophylaxis: PPI Lines: N/A Foley:  N/A Code Status:  full code Last date of multidisciplinary goals of care discussion [Pending]  Labs   CBC: Recent Labs  Lab 05/02/21 0743 05/03/21 0046 05/03/21 0101  WBC 5.7 6.5  --   NEUTROABS  --  4.7  --   HGB 9.5* 7.0* 8.5*  HCT 31.0* 22.7* 25.0*  MCV 90.9 92.3  --   PLT 124* 101*  --     Basic Metabolic Panel: Recent Labs  Lab 05/02/21 0743 05/03/21 0046 05/03/21 0101  NA 136 138 138  K 5.0 5.2* 4.8  CL 93* 97* 99  CO2 29 21*  --   GLUCOSE 165* 170* 169*  BUN 103* 157* >130*  CREATININE 7.65* 8.80* 9.40*  CALCIUM 8.6* 8.1*  --    GFR: CrCl cannot be calculated (Unknown ideal weight.). Recent Labs  Lab 05/02/21 0743 05/03/21 0046  WBC 5.7 6.5  LATICACIDVEN  --  2.3*    Liver Function Tests: Recent Labs  Lab 05/02/21 0743 05/03/21 0046  AST 22 25  ALT 15 15  ALKPHOS 44 31*  BILITOT 0.6 0.7  PROT 6.9  6.0*  ALBUMIN 3.1* 2.8*   Recent Labs  Lab 05/02/21 0743  LIPASE 87*   No results for input(s): AMMONIA in the last 168 hours.  ABG    Component Value Date/Time   PHART 7.490 (H) 12/19/2019 0817   PCO2ART 41.5 12/19/2019 0817   PO2ART 150.0 (H) 12/19/2019 0817   HCO3 26.4 11/24/2020 1606   TCO2 24 05/03/2021 0101   ACIDBASEDEF 3.0 (H) 11/10/2016 0518   O2SAT 95.0 11/24/2020 1606     Coagulation Profile: Recent Labs  Lab 05/03/21 0128  INR 1.3*    Cardiac Enzymes: No results for input(s): CKTOTAL, CKMB, CKMBINDEX, TROPONINI in the last 168 hours.  HbA1C: Hemoglobin A1C  Date/Time Value Ref Range Status  02/05/2020 12:00 AM 4.4  Final  11/09/2019 12:00 AM 5.0  Final   Hgb A1c MFr Bld  Date/Time Value Ref Range Status  02/25/2021 01:17 PM 4.9 <5.7 % of total Hgb Final    Comment:  For the purpose of screening for the presence of diabetes: . <5.7%       Consistent with the absence of diabetes 5.7-6.4%    Consistent with increased risk for diabetes             (prediabetes) > or =6.5%  Consistent with diabetes . This assay result is consistent with a decreased risk of diabetes. . Currently, no consensus exists regarding use of hemoglobin A1c for diagnosis of diabetes in children. . According to American Diabetes Association (ADA) guidelines, hemoglobin A1c <7.0% represents optimal control in non-pregnant diabetic patients. Different metrics may apply to specific patient populations.  Standards of Medical Care in Diabetes(ADA). [name]   12/15/2020 11:05 AM 4.6 <5.7 % of total Hgb Final    Comment:    For the purpose of screening for the presence of diabetes: . <5.7%       Consistent with the absence of diabetes 5.7-6.4%    Consistent with increased risk for diabetes             (prediabetes) > or =6.5%  Consistent with diabetes . This assay result is consistent with a decreased risk of diabetes. . Currently, no consensus exists regarding use  of hemoglobin A1c for diagnosis of diabetes in children. . According to American Diabetes Association (ADA) guidelines, hemoglobin A1c <7.0% represents optimal control in non-pregnant diabetic patients. Different metrics may apply to specific patient populations.  Standards of Medical Care in Diabetes(ADA). .     CBG: No results for input(s): GLUCAP in the last 168 hours.  Review of Systems:   Patient continually falling asleep and having to be stimulated during ROS  Positives in bold  Gen: fever, chills, weight change, fatigue, night sweats HEENT:  blurred vision, double vision, hearing loss, tinnitus, sinus congestion, rhinorrhea, sore throat, neck stiffness, dysphagia PULM:  shortness of breath, cough, sputum production, hemoptysis, wheezing CV: chest pain, edema, orthopnea, paroxysmal nocturnal dyspnea, palpitations GI:  abdominal pain, nausea, vomiting, diarrhea, hematochezia, melena, constipation, change in bowel habits GU: dysuria, hematuria, polyuria, oliguria, urethral discharge Endocrine: hot or cold intolerance, polyuria, polyphagia or appetite change Derm: rash, dry skin, scaling or peeling skin change Heme: easy bruising, bleeding, bleeding gums Neuro: headache, numbness, weakness, slurred speech, loss of memory or consciousness   Past Medical History:  He,  has a past medical history of Acute respiratory failure ([place]) (03/01/2018), Anemia, Arthritis, Chronic combined systolic and diastolic CHF (congestive heart failure) ([place]), Chronic lower back pain, Community acquired pneumonia (09/06/2013), COPD (chronic obstructive pulmonary disease) ([place]), Coronary atherosclerosis of native coronary artery, Drug abuse ([place]), ESRD (end stage renal disease) ([place]), Fall at home (10/2020), GERD (gastroesophageal reflux disease), GI bleeding, Glaucoma, Hepatitis B (1968), History of blood transfusion (03/01/2019), History of colon polyps, History of gout, History of kidney stones, HTN  (hypertension), Human immunodeficiency virus (HIV) disease ([place]) (dx'd 1995), Hyperlipidemia, Ischemic cardiomyopathy, Muscle spasm, Myocardial infarction ([place]) (~ 2004/2005), Nocturia, Peripheral neuropathy, Pneumonia, SDH (subdural hematoma) ([place]), Syphilis, unspecified, Type II diabetes mellitus ([place]) (2004), Wears glasses, and Wears partial dentures.   Surgical History:   Past Surgical History:  Procedure Laterality Date   AV FISTULA PLACEMENT Left 08/02/2018   Procedure: ARTERIOVENOUS (AV) FISTULA CREATION  left arm radiocephlic;  Surgeon: Marty Heck, MD;  Location: [place];  Service: Vascular;  Laterality: Left;   AV FISTULA PLACEMENT Left 08/01/2019   Procedure: LEFT BRACHIOCEPHALIC ARTERIOVENOUS (AV) FISTULA CREATION;  Surgeon: [name], MD;  Location: [place];  Service: Vascular;  Laterality: Left;   BASCILIC VEIN TRANSPOSITION Left 10/03/2019   Procedure: BASILIC VEIN TRANSPOSITION LEFT SECOND STAGE;  Surgeon: [name], MD;  Location: [facility] OR;  Service: Vascular;  Laterality: Left;   BIOPSY  01/25/2021   Procedure: BIOPSY;  Surgeon: Doran Stabler, MD;  Location: [facility] ENDOSCOPY;  Service: Gastroenterology;;   CARDIAC CATHETERIZATION  10/2002; 12/19/2004   [name] 03/08/2011   COLONOSCOPY  2013   [place]    CORONARY ARTERY BYPASS GRAFT  02/24/2003   CABG X2/notes 03/08/2011   CORONARY STENT INTERVENTION N/A 12/19/2019   Procedure: CORONARY STENT INTERVENTION;  Surgeon: Jettie Booze, MD;  Location: [place] CV LAB;  Service: Cardiovascular;  Laterality: N/A;   ENTEROSCOPY N/A 01/25/2021   Procedure: ENTEROSCOPY;  Surgeon: Doran Stabler, MD;  Location: [place];  Service: Gastroenterology;  Laterality: N/A;   ENTEROSCOPY N/A 02/13/2021   Procedure: ENTEROSCOPY;  Surgeon: Jackquline Denmark, MD;  Location[facility] ENDOSCOPY;  Service: Endoscopy;  Laterality: N/A;   ESOPHAGOGASTRODUODENOSCOPY (EGD) WITH PROPOFOL N/A 02/08/2019   Procedure:  ESOPHAGOGASTRODUODENOSCOPY (EGD) WITH PROPOFOL;  Surgeon: Milus Banister, MD;  Location: [place];  Service: Gastroenterology;  Laterality: N/A;   ESOPHAGOGASTRODUODENOSCOPY (EGD) WITH PROPOFOL N/A 12/22/2019   Procedure: ESOPHAGOGASTRODUODENOSCOPY (EGD) WITH PROPOFOL;  Surgeon: Lavena Bullion, DO;  Location: [place];  Service: Gastroenterology;  Laterality: N/A;   ESOPHAGOGASTRODUODENOSCOPY (EGD) WITH PROPOFOL N/A 10/19/2020   Procedure: ESOPHAGOGASTRODUODENOSCOPY (EGD) WITH PROPOFOL;  Surgeon: Jackquline Denmark, MD;  Location: [facility] ENDOSCOPY;  Service: Endoscopy;  Laterality: N/A;   ESOPHAGOGASTRODUODENOSCOPY (EGD) WITH PROPOFOL N/A 12/22/2020   Procedure: ESOPHAGOGASTRODUODENOSCOPY (EGD) WITH PROPOFOL;  Surgeon: Gatha Mayer, MD;  Location: [place];  Service: Endoscopy;  Laterality: N/A;   ESOPHAGOGASTRODUODENOSCOPY (EGD) WITH PROPOFOL N/A 01/09/2021   Procedure: ESOPHAGOGASTRODUODENOSCOPY (EGD) WITH PROPOFOL;  Surgeon: Irene Shipper, MD;  Location: [facility] ENDOSCOPY;  Service: Endoscopy;  Laterality: N/A;   HEMOSTASIS CLIP PLACEMENT  12/22/2019   Procedure: HEMOSTASIS CLIP PLACEMENT;  Surgeon: Lavena Bullion, DO;  Location: [place] ENDOSCOPY;  Service: Gastroenterology;;   HEMOSTASIS CLIP PLACEMENT  12/22/2020   Procedure: HEMOSTASIS CLIP PLACEMENT;  Surgeon: Gatha Mayer, MD;  Location: [facility] ENDOSCOPY;  Service: Endoscopy;;   HEMOSTASIS CONTROL  12/22/2020   Procedure: HEMOSTASIS CONTROL/hemospray;  Surgeon: Gatha Mayer, MD;  Location: [place];  Service: Endoscopy;;   HOT HEMOSTASIS N/A 02/08/2019   Procedure: HOT HEMOSTASIS (ARGON PLASMA COAGULATION/BICAP);  Surgeon: Milus Banister, MD;  Location: [facility] ENDOSCOPY;  Service: Gastroenterology;  Laterality: N/A;   HOT HEMOSTASIS N/A 12/22/2019   Procedure: HOT HEMOSTASIS (ARGON PLASMA COAGULATION/BICAP);  Surgeon: Lavena Bullion, DO;  Location: [facility] ENDOSCOPY;  Service: Gastroenterology;  Laterality: N/A;   HOT HEMOSTASIS N/A  10/19/2020   Procedure: HOT HEMOSTASIS (ARGON PLASMA COAGULATION/BICAP);  Surgeon: Jackquline Denmark, MD;  Location: [facility] ENDOSCOPY;  Service: Endoscopy;  Laterality: N/A;   HOT HEMOSTASIS N/A 12/22/2020   Procedure: HOT HEMOSTASIS (ARGON PLASMA COAGULATION/BICAP);  Surgeon: Gatha Mayer, MD;  Location: [facility]COPY;  Service: Endoscopy;  Laterality: N/A;   HOT HEMOSTASIS N/A 01/09/2021   Procedure: HOT HEMOSTASIS (ARGON PLASMA COAGULATION/BICAP);  Surgeon: Irene Shipper, MD;  Location: [facility] ENDOSCOPY;  Service: Endoscopy;  Laterality: N/A;   HOT HEMOSTASIS N/A 01/25/2021   Procedure: HOT HEMOSTASIS (ARGON PLASMA COAGULATION/BICAP);  Surgeon: Doran Stabler, MD;  Location: [place];  Service: Gastroenterology;  Laterality: N/A;   HOT HEMOSTASIS N/A 02/13/2021   Procedure: HOT HEMOSTASIS (ARGON PLASMA COAGULATION/BICAP);  Surgeon:  Jackquline Denmark, MD;  Location: [facility] ENDOSCOPY;  Service: Endoscopy;  Laterality: N/A;   INTERTROCHANTERIC HIP FRACTURE SURGERY Left 11/2006   [name] 03/08/2011   INTRAVASCULAR ULTRASOUND/IVUS N/A 12/19/2019   Procedure: Intravascular Ultrasound/IVUS;  Surgeon: Jettie Booze, MD;  Location: [place] CV LAB;  Service: Cardiovascular;  Laterality: N/A;   IR FLUORO GUIDE CV LINE RIGHT  07/24/2019   IR FLUORO GUIDE CV LINE RIGHT  07/30/2019   IR US GUIDE VASC ACCESS RIGHT  07/24/2019   IR US GUIDE VASC ACCESS RIGHT  07/30/2019   LAPAROSCOPIC CHOLECYSTECTOMY  05/2006   LIGATION OF COMPETING BRANCHES OF ARTERIOVENOUS FISTULA Left 11/05/2018   Procedure: LIGATION OF COMPETING BRANCHES OF ARTERIOVENOUS FISTULA  LEFT  ARM;  Surgeon: Marty Heck, MD;  Location: [facility] OR;  Service: Vascular;  Laterality: Left;   LUMBAR LAMINECTOMY/DECOMPRESSION MICRODISCECTOMY N/A 02/29/2016   Procedure: Left L4-5 Lateral Recess Decompression, Removal Extradural Intraspinal Facet Cyst;  Surgeon: Marybelle Killings, MD;  Location: [place];  Service: Orthopedics;  Laterality: N/A;   MULTIPLE TOOTH  EXTRACTIONS     ORIF MANDIBULAR FRACTURE Left 08/13/2004   ORIF of left body fracture mandible with KLS Martin 2.3-mm six hole/notes 03/08/2011   RIGHT/LEFT HEART CATH AND CORONARY/GRAFT ANGIOGRAPHY N/A 12/19/2019   Procedure: RIGHT/LEFT HEART CATH AND CORONARY/GRAFT ANGIOGRAPHY;  Surgeon: Jettie Booze, MD;  Location: [place] CV LAB;  Service: Cardiovascular;  Laterality: N/A;   SCLEROTHERAPY  12/22/2019   Procedure: SCLEROTHERAPY;  Surgeon: Lavena Bullion, DO;  Location: [place];  Service: Gastroenterology;;   Clide Deutscher  02/13/2021   Procedure: Clide Deutscher;  Surgeon: Jackquline Denmark, MD;  Location: [facility] ENDOSCOPY;  Service: Endoscopy;;     Social History:   reports that he has been smoking cigarettes. He has a 21.50 pack-year smoking history. He has never used smokeless tobacco. He reports previous alcohol use of about 12.0 standard drinks of alcohol per week. He reports previous drug use. Drug: Cocaine.   Family History:  His family history includes Alzheimer's disease in his mother and sister; Diabetes in his brother, brother, and sister; Drug abuse in his brother; Heart attack in his brother; Heart failure in his father; Hypertension in his brother and father; Stroke in his sister. There is no history of Colon cancer.   Allergies Allergies  Allergen Reactions   Augmentin [Amoxicillin-Pot Clavulanate] Diarrhea and Other (See Comments)    Severe diarrhea   Mucinex Fast-Max Other (See Comments)    Intense sweating    Amphetamines Other (See Comments)    Unknown reaction     Home Medications  Prior to Admission medications   Medication Sig Start Date End Date Taking? Authorizing Provider  acetaminophen (TYLENOL) 650 MG CR tablet Take 1,300 mg by mouth 3 (three) times daily.   Yes [provider]  allopurinol (ZYLOPRIM) 100 MG tablet TAKE 1 TABLET(100 MG) BY MOUTH DAILY Patient taking differently: Take 100 mg by mouth daily. 01/12/21  Yes [name], Dinah C,  NP  ANORO ELLIPTA 62.5-25 MCG/INH AEPB INHALE 1 PUFF BY MOUTH EVERY DAY Patient taking differently: Inhale 1 puff into the lungs daily. 12/21/20  Yes [name], NP  aspirin EC 81 MG tablet Take 81 mg by mouth daily. Swallow whole.   Yes [provider]  atorvastatin (LIPITOR) 10 MG tablet Take 1 tablet (10 mg total) by mouth daily. 03/02/21 05/31/21 Yes Dunn, Dayna N, PA-C  bictegravir-emtricitabine-tenofovir AF (BIKTARVY) 50-200-25 MG TABS tablet Take 1 tablet by mouth daily. 06/30/20  Yes  [name], MD  Calcium Acetate 667 MG TABS Take [phone] mg by mouth See admin instructions. Take 1,334 mg by mouth three times a day with meals and 667 mg with each snack 01/20/21  Yes [provider]  carvedilol (COREG) 3.125 MG tablet TAKE 1 TABLET(3.125 MG) BY MOUTH TWICE DAILY Patient taking differently: Take 3.125 mg by mouth 2 (two) times daily. 04/14/21  Yes [name], MD  colchicine 0.6 MG tablet TAKE 1 TABLET BY MOUTH AS NEEDED FOR GOUT Patient taking differently: Take 0.6 mg by mouth as needed (gout flares). 04/14/21  Yes [name], Dinah C, NP  diclofenac Sodium (VOLTAREN) 1 % GEL APPLY 2 GRAMS TOPICALLY TO THE AFFECTED AREA THREE TIMES DAILY AS NEEDED FOR PAIN Patient taking differently: Apply 2 g topically 3 (three) times daily as needed (pain). 04/14/21  Yes [name], NP  folic acid (FOLVITE) 1 MG tablet Take 1 tablet (1 mg total) by mouth daily. 01/12/21  Yes [name], Dinah C, NP  gabapentin (NEURONTIN) 300 MG capsule Take 1 capsule (300 mg total) by mouth daily. Dose change due to renal function 12/09/20  Yes Eubanks, [name], NP  insulin lispro (HUMALOG) 100 UNIT/ML KwikPen INJECT 20 UNITS UNDER THE SKIN THREE TIMES DAILY AS NEEDED FOR HIGH BLOOD SUGAR(ABOVE 150) Patient taking differently: Inject 20 Units into the skin 3 (three) times daily as needed (blood sugar over 150). 02/01/21  Yes [name], NP  isosorbide mononitrate (IMDUR) 30 MG 24 hr  tablet TAKE 1 TABLET BY MOUTH EVERY DAY Patient taking differently: Take 30 mg by mouth daily. 01/12/21  Yes [name], Dinah C, NP  latanoprost (XALATAN) 0.005 % ophthalmic solution Place 1 drop into both eyes at bedtime.   Yes [provider]  lidocaine-prilocaine (EMLA) cream Apply 1 application topically every Monday, Wednesday, and Friday with hemodialysis.   Yes [provider]  Methoxy PEG-Epoetin Beta (MIRCERA IJ) Mircera 12/25/20  Yes [provider]  multivitamin (RENA-VIT) TABS tablet Take 1 tablet by mouth daily.   Yes [provider]  nitroGLYCERIN (NITROSTAT) 0.3 MG SL tablet PLACE 1 TABLET UNDER THE TONGUE AS NEEDED FOR CHEST PAIN Patient taking differently: Place 0.3 mg under the tongue every 5 (five) minutes as needed for chest pain. 04/14/21  Yes [name], MD  oxyCODONE-acetaminophen (PERCOCET/ROXICET) 5-325 MG tablet Take 1 tablet by mouth daily as needed for severe pain. 04/28/21  Yes Magnant, Charles L, PA-C  pantoprazole (PROTONIX) 40 MG tablet Take 1 tablet (40 mg total) by mouth 2 (two) times daily before a meal. 02/16/21  Yes Thurnell Lose, MD  polyethylene glycol (MIRALAX / GLYCOLAX) 17 g packet Take 17 g by mouth daily as needed for mild constipation. 11/07/20  Yes Aline August, MD  timolol (TIMOPTIC) 0.5 % ophthalmic solution Place 1 drop into both eyes in the morning. 04/04/19  Yes [provider]  amoxicillin (AMOXIL) 500 MG tablet Take 1 tablet (500 mg total) by mouth 2 (two) times daily. Patient not taking: No sig reported 04/08/21   Kuppelweiser, Cassie L, RPH-CPP  B-D UF III MINI PEN NEEDLES 31G X 5 MM MISC USE FOUR TIMES DAILY 03/02/20   [name], NP  clarithromycin (BIAXIN) 500 MG tablet Take 1 tablet (500 mg total) by mouth daily. Patient not taking: No sig reported 04/08/21   Kuppelweiser, Cassie L, RPH-CPP  glucose blood (ONETOUCH ULTRA) test strip Check blood sugar three times daily. Dx:E11.40 03/02/21    [name],  NP  Lancets (ONETOUCH DELICA PLUS [id number]) MISC Inject 1 Device as directed 3 (three) times daily. Dx: E11.40 09/18/19   [name], NP  misoprostol (CYTOTEC) 100 MCG tablet Take 1 tablet (100 mcg total) by mouth every 6 (six) hours. Patient not taking: Reported on 05/02/2021 02/16/21   Thurnell Lose, MD  sodium chloride irrigation 0.9 % irrigation Cleanse right foot ulcer with saline once daily. Patient not taking: No sig reported 03/19/21   Marzetta Board, DPM     Critical care time: 62 Minutes    Redmond School., MSN, APRN, AGACNP-BC [facility]  05/03/2021 , 3:38 AM  Please see [email or web address] for pager details  If no response, please call [phone] After hours, please call Elink at [phone]

## 2021-05-03 NOTE — Anesthesia Preprocedure Evaluation (Deleted)
Anesthesia Evaluation    Reviewed: Allergy & Precautions, Patient's Chart, lab work & pertinent test results  History of Anesthesia Complications Negative for: history of anesthetic complications  Airway        Dental  (+)    Pulmonary COPD, Current Smoker and Patient abstained from smoking.,           Cardiovascular hypertension, Pt. on medications and Pt. on home beta blockers + CAD, + Past MI (2004), + Cardiac Stents, + CABG and +CHF       Neuro/Psych negative neurological ROS  negative psych ROS   GI/Hepatic GERD  Medicated and Controlled,(+) Hepatitis -, BGIB   Endo/Other  diabetes, Type 2, Insulin Dependent  Renal/GU ESRF and DialysisRenal disease (M/W/F)  negative genitourinary   Musculoskeletal  (+) Arthritis ,   Abdominal   Peds  Hematology  (+) anemia , HIV,   Anesthesia Other Findings Day of surgery medications reviewed with patient.  Reproductive/Obstetrics negative OB ROS                             Anesthesia Physical  Anesthesia Plan  ASA: 3  Anesthesia Plan: MAC   Post-op Pain Management:    Induction:   PONV Risk Score and Plan: 0 and Propofol infusion  Airway Management Planned: Natural Airway  Additional Equipment: None  Intra-op Plan:   Post-operative Plan:   Informed Consent:   Plan Discussed with: Anesthesiologist  Anesthesia Plan Comments:         Anesthesia Quick Evaluation

## 2021-05-03 NOTE — Progress Notes (Signed)
eLink Physician-Brief Progress Note Patient Name: DEAIRE MCWHIRTER DOB: 1951/05/11 MRN: [id number]   Date of Service  05/03/2021  HPI/Events of Note  9M with hx of HIV, L9KC, systolic HF, COPD, CAD s/p CABG, ESRD on HD, and GI bleeding. He presents with report of hematemesis and melena c/f GI bleed.  Hgb 7.0. BUN >130. INR 1.3. Platelets 101. Received ddAVP and started on Protonix drip. Ordered for 1u pRBCs.   Some hypotension since arrival (low of 93/47 with MAP 62). Currently 125/54 (MAP 71). Remainder of vitals within normal limits.  First pRBC unit is transfusing now.   eICU Interventions   # Neuro: - Mildly encephalopathic: Likely uremic encephalopathy in setting of BUN > 130. Will need HD today.  # Cardiac: - Transfuse 1u PRBCs.  # GI: - GI consult for presumed upper GI bleeding. Protonix infusion. ddAVP. NPO. Monitor H/H.  # Renal: - HD today as above. Consult nephrology.  DVT PPX: SCDs only (bleeding) GI PPX: Protonix infusion     Intervention Category Evaluation Type: New Patient Evaluation  Charlott Rakes 05/03/2021, 3:46 AM

## 2021-05-03 NOTE — ED Triage Notes (Signed)
Pt bib ems from home c/o blood in emesis and stool. Pt stated he vomited one time today and had a BM 3x today with black stool. Pt c/o abdominal pain in the LRQ that radiates down the left side of his body.  HR 94 RA 98% CBG 215 BP 102/56   50CC NS

## 2021-05-03 NOTE — Progress Notes (Signed)
[facility] Progress Note Patient Name: Jacob [name] DOB: 1951-05-05 MRN: [id number]   Date of Service  05/03/2021  HPI/Events of Note  Patient is c /o pain, he is NPO.  eICU Interventions  Dilaudid 0.5 mg iv Q 4 hours PRN x 2 doses ordered.        Kerry Kass Adaline Trejos 05/03/2021, 11:27 PM

## 2021-05-03 NOTE — ED Notes (Signed)
Pt is lethargic. He keeps dozing off while documenting.

## 2021-05-03 NOTE — ED Notes (Signed)
Informed Dr. Lars Masson of pt's POSITIVE occult blood result

## 2021-05-03 NOTE — Consult Note (Addendum)
Referring Provider:  [facility]         Primary Care Physician:  [name], NP Primary Gastroenterologist:   Scarlette Shorts, MD            We were asked to see this patient for:   GI bleeding               ASSESSMENT / PLAN:   # 70 yo male with recurrent / chronic GI bleeding. Known history of gastric and small bowel AVMs. Presenting with dark stool, ? Brown emesis and hgb ~ 7 ( down from ~ 9).  --Will most likely require repeat EGD tomorrow. Clears okay today. NPO after MN --PPI infusion in progress --Hgb improved to 8.5 grams after 2 u PRBC  # Small amount of biliary pneumobilia on CT scan ( chronic). Remote ERCP  # ESRD.  MWF schedule.  He is very uremic with BUN > 130. Awaiting Nephrology visit but nursing staff think he may be going for dialysis today.   # CAD with chronic combined systolic and diastolic heart failure EF 40%. ASA on hold due to GI bleed. PP infusion is ongoing.    # History of HIV, on Biktarvy  # History of polysubstance abuse including EtOH, cocaine and tobacco abuse.     # Chronically elevated lipase. Secondary to decreased renal clearance?   # Mild coagulopathy, thrombocytopenia, hypoalbuminemia. Most of this findings are explainable but underlying liver disease not totally excluded.   Patient seen and evaluated as well.  He is chronically ill with multiple medical problems as we have outlined.  He is once again presented with a possible recurrent GI bleed and we plan for an EGD/enteroscopy tomorrow to evaluate.  I have fully explained the risks benefits and indications to the patient and he understands and agrees to proceed.Gatha Mayer, MD, [facility] 05/03/2021 1:17 PM   HPI:                                                                                                                             Chief Complaint:  dark stools, anemia  Jacob [name] is a 70 y.o. male with a past medical history significant for  HTN, CAD / CABG, ischemic cardiomyopathy, DM, HIV, ESRD on HD M / W/ Fr.,HIV, COPD, diabetes, polysubstance abuse, glaucome,  and recurrent GI bleed secondary to AVM. See PMH for additional medical history.   Patient has a history of recurrent GI bleeding secondary to AVMs ( most recent was late April 2022). He has required multiple EGDs with APC of gastric and small bowel AVMs ( most recent was late April 2022)  Patient brought to ED yesterday with sob , abdominal pain and brown emeisis. . Recently diagnosed with H.pylori. in ED his hgb was 7 ( BL hgb  hard to discern with frequent GI bleeds but it was mid 9.5  in early May. Non cont CTAP shows a small  amount of biliary air, negative pancreas, s/p cholecystectomy ( see full report below)  History mainly comes from nursing staff  and record. Patient is very sleepy, keeps falling asleep during interview. BUN > 130. He actually denies any recent abdominal pain or emesis but probably too sleep to remember. However he does endorse dark stools for about one week. He no  longer takes anticoagulant at home but does take a daily baby ASA. BID PPI on home med list. Patient says he completed the course of antibiotics for H.pylori last week.  We apparently referred him to ID for a persistent infection.   PREVIOUS ENDOSCOPIC EVALUATIONS / PERTINENT STUDIES   10/19/20 EGD for GI bleed Gastric AVMs s/p APC treatment. - No specimens collected   12/22/19 EGD --bleeding gastric AVMs   April 2020 EGD --Gastric and duodenal bulb AVMs   Dec 2013 screening colonoscopy --moderate diverticulosis --2 mm polyp ( removed but not retrieved)  December 22, 2020 EGD for melena Blood in the duodenal bulb. - Nodule found in the duodenum. steady non-pulsatile bleeding seen at base, very close to distal aspect pylorus so positioning difficult - ? AVM Treated with argon plasma coagulation (APC) - partially successful Clips (MR conditional) were placed. hemostatic spray applied.  No bleeding at end - The examination was otherwise normal.  January 09, 2021 EGD for  GI bleed -  Bleeding gastric AVMs status post successful APC therapy -- Otherwise unremarkable upper endoscopy  January 25, 2021 EGD for anemia / melena -Normal esophagus. - Gastritis. Biopsied. - A few bleeding angioectasias in the duodenum. Treated with argon plasma coagulation (APC). - Nodular mucosa in the duodenal bulb and in the first portion of the duodenum. - The examined portion of the jejunum was normal.  05/02/21 Non con CTAP IMPRESSION: 1. No acute abnormality. 2. Extensive, severe calcified atherosclerotic changes, as described above. These include the coronary arteries, superior mesenteric artery and renal arteries. 3. Sigmoid colon diverticulosis. 4. Stable cardiomegaly.    Past Medical History:  Diagnosis Date   Acute respiratory failure ([place]) 03/01/2018   Anemia    Arthritis    "all over; mostly knees and back" (02/28/2018)   Chronic combined systolic and diastolic CHF (congestive heart failure) (HCC)    Chronic lower back pain    stenosis   Community acquired pneumonia 09/06/2013   COPD (chronic obstructive pulmonary disease) ([place])    Coronary atherosclerosis of native coronary artery    a. 02/2003 s/p CABG x 2 (VG->RI, VG->RPDA; b. 11/2019 PCI: LM nl, LAD 90d, D3 50, RI 100, LCX 100p, OM3 100 - fills via L->L collats from D2/dLAD, RCA 100p, VG->RPDA ok, VG->RI 95 (3.5x48 Synergy XD DES).   Drug abuse ([place])    hx; tested for cocaine as recently as 2/08. says he is not using drugs now - avoided defib. for this reason    ESRD (end stage renal disease) ([place])    Hemo M-W-F- Richarda Blade   Fall at home 10/2020   GERD (gastroesophageal reflux disease)    takes OTC meds as needed   GI bleeding    a. 11/2019 EGD: angiodysplastic lesions w/ bleeding s/p argon plasma/clipping/epi inj. Multiple admissions for the same.   Glaucoma    uses eye drops daily   Hepatitis B 1968   "tx'd  w/isolation; caught it from toilet stools in gym"   History of blood transfusion 03/01/2019   History of colon polyps    benign   History of gout  takes Allopurinol daily as well as Colchicine-if needed (02/28/2018)   History of kidney stones    HTN (hypertension)    takes Coreg,Imdur.and Apresoline daily   Human immunodeficiency virus (HIV) disease ([place]) dx'd 1995   on [place] as of 12/2020.     Hyperlipidemia    Ischemic cardiomyopathy    a. 01/2019 Echo: EF 40-45%, diffuse HK, mild basal septal hypertrophy. Diast dysfxn. Nl RV size/fxn. Sev dil LA. Triv MR/TR/PR.   Muscle spasm    takes Zanaflex as needed   Myocardial infarction ([place]) ~ 2004/2005   Nocturia    Peripheral neuropathy    takes gabapentin daily   Pneumonia    "at least twice" (02/28/2018)   SDH (subdural hematoma) (HCC)    Syphilis, unspecified    Type II diabetes mellitus ([place]) 2004   Lantus daily.Average fasting blood sugar 125-199   Wears glasses    Wears partial dentures     Past Surgical History:  Procedure Laterality Date   AV FISTULA PLACEMENT Left 08/02/2018   Procedure: ARTERIOVENOUS (AV) FISTULA CREATION  left arm radiocephlic;  Surgeon: Marty Heck, MD;  Location: [place];  Service: Vascular;  Laterality: Left;   AV FISTULA PLACEMENT Left 08/01/2019   Procedure: LEFT BRACHIOCEPHALIC ARTERIOVENOUS (AV) FISTULA CREATION;  Surgeon: [name], MD;  Location: [place];  Service: Vascular;  Laterality: Left;   [place] Left 10/03/2019   Procedure: BASILIC VEIN TRANSPOSITION LEFT SECOND STAGE;  Surgeon: [name], MD;  Location: [place];  Service: Vascular;  Laterality: Left;   BIOPSY  01/25/2021   Procedure: BIOPSY;  Surgeon: Doran Stabler, MD;  Location: [facility] END[facility]COPY;  Service: Gastroenterology;;   CARDIAC CATHETERIZATION  10/2002; 12/19/2004   [name] 03/08/2011   COLONOSCOPY  2013   [place]    CORONARY ARTERY BYPASS GRAFT  02/24/2003   CABG X2/notes 03/08/2011    CORONARY STENT INTERVENTION N/A 12/19/2019   Procedure: CORONARY STENT INTERVENTION;  Surgeon: Jettie Booze, MD;  Location: [place] CV LAB;  Service: Cardiovascular;  Laterality: N/A;   ENTEROSCOPY N/A 01/25/2021   Procedure: ENTEROSCOPY;  Surgeon: Doran Stabler, MD;  Location: [place];  Service: Gastroenterology;  Laterality: N/A;   ENTEROSCOPY N/A 02/13/2021   Procedure: ENTEROSCOPY;  Surgeon: Jackquline Denmark, MD;  Location: [facility] ENDOSCOPY;  Service: Endoscopy;  Laterality: N/A;   ESOPHAGOGASTRODUODENOSCOPY (EGD) WITH PROPOFOL N/A 02/08/2019   Procedure: ESOPHAGOGASTRODUODENOSCOPY (EGD) WITH PROPOFOL;  Surgeon: Milus Banister, MD;  Location: [place];  Service: Gastroenterology;  Laterality: N/A;   ESOPHAGOGASTRODUODENOSCOPY (EGD) WITH PROPOFOL N/A 12/22/2019   Procedure: ESOPHAGOGASTRODUODENOSCOPY (EGD) WITH PROPOFOL;  Surgeon: Lavena Bullion, DO;  Location: [place];  Service: Gastroenterology;  Laterality: N/A;   ESOPHAGOGASTRODUODENOSCOPY (EGD) WITH PROPOFOL N/A 10/19/2020   Procedure: ESOPHAGOGASTRODUODENOSCOPY (EGD) WITH PROPOFOL;  Surgeon: Jackquline Denmark, MD;  Location: [name] ENDOSCOPY;  Service: Endoscopy;  Laterality: N/A;   ESOPHAGOGASTRODUODENOSCOPY (EGD) WITH PROPOFOL N/A 12/22/2020   Procedure: ESOPHAGOGASTRODUODENOSCOPY (EGD) WITH PROPOFOL;  Surgeon: Gatha Mayer, MD;  Location: [place];  Service: Endoscopy;  Laterality: N/A;   ESOPHAGOGASTRODUODENOSCOPY (EGD) WITH PROPOFOL N/A 01/09/2021   Procedure: ESOPHAGOGASTRODUODENOSCOPY (EGD) WITH PROPOFOL;  Surgeon: Irene Shipper, MD;  Location: [facility] ENDOSCOPY;  Service: Endoscopy;  Laterality: N/A;   HEMOSTASIS CLIP PLACEMENT  12/22/2019   Procedure: HEMOSTASIS CLIP PLACEMENT;  Surgeon: Lavena Bullion, DO;  Location: [place];  Service: Gastroenterology;;   HEMOSTASIS CLIP PLACEMENT  12/22/2020   Procedure: HEMOSTASIS CLIP PLACEMENT;  Surgeon: Gatha Mayer, MD;  Location: [facility] ENDOSCOPY;  Service:  Endoscopy;;   HEMOSTASIS CONTROL  12/22/2020   Procedure: HEMOSTASIS CONTROL/hemospray;  Surgeon: Gatha Mayer, MD;  Location: [place];  Service: Endoscopy;;   HOT HEMOSTASIS N/A 02/08/2019   Procedure: HOT HEMOSTASIS (ARGON PLASMA COAGULATION/BICAP);  Surgeon: Milus Banister, MD;  Location: [facility] ENDOSCOPY;  Service: Gastroenterology;  Laterality: N/A;   HOT HEMOSTASIS N/A 12/22/2019   Procedure: HOT HEMOSTASIS (ARGON PLASMA COAGULATION/BICAP);  Surgeon: Lavena Bullion, DO;  Location: [facility] ENDOSCOPY;  Service: Gastroenterology;  Laterality: N/A;   HOT HEMOSTASIS N/A 10/19/2020   Procedure: HOT HEMOSTASIS (ARGON PLASMA COAGULATION/BICAP);  Surgeon: Jackquline Denmark, MD;  Location: [facility] ENDOSCOPY;  Service: Endoscopy;  Laterality: N/A;   HOT HEMOSTASIS N/A 12/22/2020   Procedure: HOT HEMOSTASIS (ARGON PLASMA COAGULATION/BICAP);  Surgeon: Gatha Mayer, MD;  Location: [facility] ENDOSCOPY;  Service: Endoscopy;  Laterality: N/A;   HOT HEMOSTASIS N/A 01/09/2021   Procedure: HOT HEMOSTASIS (ARGON PLASMA COAGULATION/BICAP);  Surgeon: Irene Shipper, MD;  Location: [facility] ENDOSCOPY;  Service: Endoscopy;  Laterality: N/A;   HOT HEMOSTASIS N/A 01/25/2021   Procedure: HOT HEMOSTASIS (ARGON PLASMA COAGULATION/BICAP);  Surgeon: Doran Stabler, MD;  Location: [place];  Service: Gastroenterology;  Laterality: N/A;   HOT HEMOSTASIS N/A 02/13/2021   Procedure: HOT HEMOSTASIS (ARGON PLASMA COAGULATION/BICAP);  Surgeon: Jackquline Denmark, MD;  Location: [facility] ENDOSCOPY;  Service: Endoscopy;  Laterality: N/A;   INTERTROCHANTERIC HIP FRACTURE SURGERY Left 11/2006   [name] 03/08/2011   INTRAVASCULAR ULTRASOUND/IVUS N/A 12/19/2019   Procedure: Intravascular Ultrasound/IVUS;  Surgeon: Jettie Booze, MD;  Location: [place] CV LAB;  Service: Cardiovascular;  Laterality: N/A;   IR FLUORO GUIDE CV LINE RIGHT  07/24/2019   IR FLUORO GUIDE CV LINE RIGHT  07/30/2019   IR US GUIDE VASC ACCESS RIGHT  07/24/2019   IR US GUIDE VASC  ACCESS RIGHT  07/30/2019   LAPAROSCOPIC CHOLECYSTECTOMY  05/2006   LIGATION OF COMPETING BRANCHES OF ARTERIOVENOUS FISTULA Left 11/05/2018   Procedure: LIGATION OF COMPETING BRANCHES OF ARTERIOVENOUS FISTULA  LEFT  ARM;  Surgeon: Marty Heck, MD;  Location: [facility] OR;  Service: Vascular;  Laterality: Left;   LUMBAR LAMINECTOMY/DECOMPRESSION MICRODISCECTOMY N/A 02/29/2016   Procedure: Left L4-5 Lateral Recess Decompression, Removal Extradural Intraspinal Facet Cyst;  Surgeon: Marybelle Killings, MD;  Location: [place];  Service: Orthopedics;  Laterality: N/A;   MULTIPLE TOOTH EXTRACTIONS     ORIF MANDIBULAR FRACTURE Left 08/13/2004   ORIF of left body fracture mandible with KLS Martin 2.3-mm six hole/notes 03/08/2011   RIGHT/LEFT HEART CATH AND CORONARY/GRAFT ANGIOGRAPHY N/A 12/19/2019   Procedure: RIGHT/LEFT HEART CATH AND CORONARY/GRAFT ANGIOGRAPHY;  Surgeon: Jettie Booze, MD;  Location: [place] CV LAB;  Service: Cardiovascular;  Laterality: N/A;   SCLEROTHERAPY  12/22/2019   Procedure: SCLEROTHERAPY;  Surgeon: Lavena Bullion, DO;  Location: [facility] ENDOSCOPY;  Service: Gastroenterology;;   Clide Deutscher  02/13/2021   Procedure: Clide Deutscher;  Surgeon: Jackquline Denmark, MD;  Location: [facility] ENDOSCOPY;  Service: Endoscopy;;    Prior to Admission medications   Medication Sig Start Date End Date Taking? Authorizing Provider  acetaminophen (TYLENOL) 650 MG CR tablet Take 1,300 mg by mouth 3 (three) times daily.   Yes [provider]  allopurinol (ZYLOPRIM) 100 MG tablet TAKE 1 TABLET(100 MG) BY MOUTH DAILY Patient taking differently: Take 100 mg by mouth daily. 01/12/21  Yes [name], Dinah C, NP  ANORO ELLIPTA 62.5-25 MCG/INH AEPB INHALE 1 PUFF BY MOUTH  EVERY DAY Patient taking differently: Inhale 1 puff into the lungs daily. 12/21/20  Yes [name], NP  aspirin EC 81 MG tablet Take 81 mg by mouth daily. Swallow whole.   Yes [provider]  atorvastatin (LIPITOR) 10 MG  tablet Take 1 tablet (10 mg total) by mouth daily. 03/02/21 05/31/21 Yes Dunn, Dayna N, PA-C  bictegravir-emtricitabine-tenofovir AF (BIKTARVY) 50-200-25 MG TABS tablet Take 1 tablet by mouth daily. 06/30/20  Yes [name], MD  Calcium Acetate 667 MG TABS Take [phone] mg by mouth See admin instructions. Take 1,334 mg by mouth three times a day with meals and 667 mg with each snack 01/20/21  Yes [provider]  carvedilol (COREG) 3.125 MG tablet TAKE 1 TABLET(3.125 MG) BY MOUTH TWICE DAILY Patient taking differently: Take 3.125 mg by mouth 2 (two) times daily. 04/14/21  Yes [name], MD  colchicine 0.6 MG tablet TAKE 1 TABLET BY MOUTH AS NEEDED FOR GOUT Patient taking differently: Take 0.6 mg by mouth as needed (gout flares). 04/14/21  Yes [name], Dinah C, NP  diclofenac Sodium (VOLTAREN) 1 % GEL APPLY 2 GRAMS TOPICALLY TO THE AFFECTED AREA THREE TIMES DAILY AS NEEDED FOR PAIN Patient taking differently: Apply 2 g topically 3 (three) times daily as needed (pain). 04/14/21  Yes [name], NP  folic acid (FOLVITE) 1 MG tablet Take 1 tablet (1 mg total) by mouth daily. 01/12/21  Yes [name], Dinah C, NP  gabapentin (NEURONTIN) 300 MG capsule Take 1 capsule (300 mg total) by mouth daily. Dose change due to renal function 12/09/20  Yes Eubanks, [name], NP  insulin lispro (HUMALOG) 100 UNIT/ML KwikPen INJECT 20 UNITS UNDER THE SKIN THREE TIMES DAILY AS NEEDED FOR HIGH BLOOD SUGAR(ABOVE 150) Patient taking differently: Inject 20 Units into the skin 3 (three) times daily as needed (blood sugar over 150). 02/01/21  Yes [name], NP  isosorbide mononitrate (IMDUR) 30 MG 24 hr tablet TAKE 1 TABLET BY MOUTH EVERY DAY Patient taking differently: Take 30 mg by mouth daily. 01/12/21  Yes [name], Dinah C, NP  latanoprost (XALATAN) 0.005 % ophthalmic solution Place 1 drop into both eyes at bedtime.   Yes [provider]  lidocaine-prilocaine (EMLA) cream Apply 1 application  topically every Monday, Wednesday, and Friday with hemodialysis.   Yes [provider]  Methoxy PEG-Epoetin Beta (MIRCERA IJ) Mircera 12/25/20  Yes [provider]  multivitamin (RENA-VIT) TABS tablet Take 1 tablet by mouth daily.   Yes [provider]  nitroGLYCERIN (NITROSTAT) 0.3 MG SL tablet PLACE 1 TABLET UNDER THE TONGUE AS NEEDED FOR CHEST PAIN Patient taking differently: Place 0.3 mg under the tongue every 5 (five) minutes as needed for chest pain. 04/14/21  Yes [name], MD  oxyCODONE-acetaminophen (PERCOCET/ROXICET) 5-325 MG tablet Take 1 tablet by mouth daily as needed for severe pain. 04/28/21  Yes Magnant, Charles L, PA-C  pantoprazole (PROTONIX) 40 MG tablet Take 1 tablet (40 mg total) by mouth 2 (two) times daily before a meal. 02/16/21  Yes Thurnell Lose, MD  polyethylene glycol (MIRALAX / GLYCOLAX) 17 g packet Take 17 g by mouth daily as needed for mild constipation. 11/07/20  Yes Aline August, MD  timolol (TIMOPTIC) 0.5 % ophthalmic solution Place 1 drop into both eyes in the morning. 04/04/19  Yes [provider]  amoxicillin (AMOXIL) 500 MG tablet Take 1 tablet (500 mg total) by mouth 2 (two) times daily. Patient not taking: No sig reported  04/08/21   Kuppelweiser, Cassie L, RPH-CPP  B-D UF III MINI PEN NEEDLES 31G X 5 MM MISC USE FOUR TIMES DAILY 03/02/20   [name], NP  clarithromycin (BIAXIN) 500 MG tablet Take 1 tablet (500 mg total) by mouth daily. Patient not taking: No sig reported 04/08/21   Kuppelweiser, Cassie L, RPH-CPP  glucose blood (ONETOUCH ULTRA) test strip Check blood sugar three times daily. Dx:E11.40 03/02/21   [name], NP  Lancets [facility] DELICA PLUS [id number]) MISC Inject 1 Device as directed 3 (three) times daily. Dx: E11.40 09/18/19   [name], NP  misoprostol (CYTOTEC) 100 MCG tablet Take 1 tablet (100 mcg total) by mouth every 6 (six) hours. Patient not taking: Reported on 05/02/2021  02/16/21   Thurnell Lose, MD  sodium chloride irrigation 0.9 % irrigation Cleanse right foot ulcer with saline once daily. Patient not taking: No sig reported 03/19/21   Marzetta Board, DPM    Current Facility-Administered Medications  Medication Dose Route Frequency Provider Last Rate Last Admin   atorvastatin (LIPITOR) tablet 10 mg  10 mg Oral Daily Collier Bullock, MD       bictegravir-emtricitabine-tenofovir AF (BIKTARVY) 50-200-25 MG per tablet 1 tablet  1 tablet Oral Daily Collier Bullock, MD       Chlorhexidine Gluconate Cloth 2 % PADS 6 each  6 each Topical Daily Collier Bullock, MD   6 each at 05/03/21 0345   docusate sodium (COLACE) capsule 100 mg  100 mg Oral BID PRN Collier Bullock, MD       insulin aspart (novoLOG) injection 0-15 Units  0-15 Units Subcutaneous Q4H Collier Bullock, MD   2 Units at 05/03/21 0804   latanoprost (XALATAN) 0.005 % ophthalmic solution 1 drop  1 drop Both Eyes QHS Collier Bullock, MD       octreotide (SANDOSTATIN) 500 mcg in sodium chloride 0.9 % 250 mL (2 mcg/mL) infusion  50 mcg/hr Intravenous Continuous Collier Bullock, MD 25 mL/hr at 05/03/21 0800 50 mcg/hr at 05/03/21 0800   ondansetron (ZOFRAN) injection 4 mg  4 mg Intravenous Q6H PRN Collier Bullock, MD       [START ON 05/06/2021] pantoprazole (PROTONIX) injection 40 mg  40 mg Intravenous Q12H Palumbo, April, MD       pantoprozole (PROTONIX) 80 mg /NS 100 mL infusion  8 mg/hr Intravenous Continuous Palumbo, April, MD 10 mL/hr at 05/03/21 0800 8 mg/hr at 05/03/21 0800   polyethylene glycol (MIRALAX / GLYCOLAX) packet 17 g  17 g Oral Daily PRN Collier Bullock, MD       timolol (TIMOPTIC) 0.5 % ophthalmic solution 1 drop  1 drop Both Eyes Daily Collier Bullock, MD       umeclidinium-vilanterol (ANORO ELLIPTA) 62.5-25 MCG/INH 1 puff  1 puff Inhalation Daily Collier Bullock, MD   1 puff at 05/03/21 0818    Allergies as of 05/03/2021 - Review Complete 05/03/2021  Allergen Reaction Noted    Augmentin [amoxicillin-pot clavulanate] Diarrhea and Other (See Comments) 12/05/2018   Mucinex fast-max Other (See Comments) 11/30/2020   Amphetamines Other (See Comments) 01/15/2012    Family History  Problem Relation Age of Onset   Heart failure Father    Hypertension Father    Diabetes Brother    Heart attack Brother    Alzheimer's disease Mother    Stroke Sister    Diabetes Sister    Alzheimer's disease Sister    Hypertension Brother    Diabetes Brother    Drug abuse Brother  Colon cancer Neg Hx     Social History   Socioeconomic History   Marital status: Single    Spouse name: Not on file   Number of children: 1   Years of education: Not on file   Highest education level: Not on file  Occupational History   Occupation: retired  Tobacco Use   Smoking status: Every Day    Packs/day: 0.50    Years: 43.00    Pack years: 21.50    Types: Cigarettes   Smokeless tobacco: Never  Vaping Use   Vaping Use: Never used  Substance and Sexual Activity   Alcohol use: Not Currently    Alcohol/week: 12.0 standard drinks    Types: 12 Standard drinks or equivalent per week    Comment: occassional   Drug use: Not Currently    Types: Cocaine    Comment: hx of crack/cocaine 38yrs ago 10/01/2019- none   Sexual activity: Yes    Partners: Male    Comment: Declined condoms  Other Topics Concern   Not on file  Social History Narrative   Retired.       As of 05/2015:   Diet: No salt    Caffeine   Married: Single    House: Condo, 2 stories, 1 person (self)    Pets: No    Current/Past profession: N/A   Exercise: walks daily    Living Will: Yes    DNR   POA/HPOA: No       Social Determinants of Radio broadcast assistant Strain: Low Risk    Difficulty of Paying Living Expenses: Not hard at all  Food Insecurity: No Food Insecurity   Worried About Charity fundraiser in the Last Year: Never true   Arboriculturist in the Last Year: Never true  Transportation Needs: No  Transportation Needs   Lack of Transportation (Medical): No   Lack of Transportation (Non-Medical): No  Physical Activity: Not on file  Stress: Not on file  Social Connections: Not on file  Intimate Partner Violence: Not on file    Review of Systems: All systems reviewed and negative except where noted in HPI.    OBJECTIVE:    Physical Exam: Vital signs in last 24 hours: Temp:  [97.1 F (36.2 C)-98.6 F (37 C)] 98.6 F (37 C) (07/11 0800) Pulse Rate:  [74-99] 74 (07/11 0800) Resp:  [10-24] 12 (07/11 0800) BP: (83-152)/(22-71) 139/55 (07/11 0800) SpO2:  [94 %-100 %] 99 % (07/11 0818) Weight:  [74.6 kg] 74.6 kg (07/11 0500)   General:   Sleepy male in NAD Psych: cooperative.  Eyes:  Pupils equal, sclera clear, no icterus.   Conjunctiva pink. Ears:  Normal auditory acuity. Nose:  No deformity, discharge,  or lesions. Neck:  Supple; no masses Lungs:  Clear throughout to auscultation.   No wheezes, crackles, or rhonchi.  Heart:  Regular rate, no lower extremity edema Abdomen:  Soft, non-distended, nontender, BS active, no palp mass   Rectal:  Deferred  Msk:  Symmetrical without gross deformities. . Neurologic:  sleepy,  grossly normal neurologically. Skin:  Intact without significant lesions or rashes.  Filed Weights   05/03/21 0500  Weight: 74.6 kg     Scheduled inpatient medications  atorvastatin  10 mg Oral Daily   bictegravir-emtricitabine-tenofovir AF  1 tablet Oral Daily   Chlorhexidine Gluconate Cloth  6 each Topical Daily   insulin aspart  0-15 Units Subcutaneous Q4H   latanoprost  1 drop Both Eyes  QHS   [START ON 05/06/2021] pantoprazole  40 mg Intravenous Q12H   timolol  1 drop Both Eyes Daily   umeclidinium-vilanterol  1 puff Inhalation Daily      Intake/Output from previous day: 07/10 0701 - 07/11 0700 In: 787.9 [I.V.:72.9; Blood:315; IV Piggyback:399.9] Out: -  Intake/Output this shift: Total I/O In: 549.2 [I.V.:35; Blood:514.2] Out: -     Lab Results: Recent Labs    05/02/21 0743 05/03/21 0046 05/03/21 0101  WBC 5.7 6.5  --   HGB 9.5* 7.0* 8.5*  HCT 31.0* 22.7* 25.0*  PLT 124* 101*  --    BMET Recent Labs    05/02/21 0743 05/03/21 0046 05/03/21 0101  NA 136 138 138  K 5.0 5.2* 4.8  CL 93* 97* 99  CO2 29 21*  --   GLUCOSE 165* 170* 169*  BUN 103* 157* >130*  CREATININE 7.65* 8.80* 9.40*  CALCIUM 8.6* 8.1*  --    LFT Recent Labs    05/03/21 0046  PROT 6.0*  ALBUMIN 2.8*  AST 25  ALT 15  ALKPHOS 31*  BILITOT 0.7   PT/INR Recent Labs    05/03/21 0128  LABPROT 15.8*  INR 1.3*   Hepatitis Panel No results for input(s): HEPBSAG, HCVAB, HEPAIGM, HEPBIGM in the last 72 hours.   . CBC Latest Ref Rng & Units 05/03/2021 05/03/2021 05/02/2021  WBC 4.0 - 10.5 K/uL - 6.5 5.7  Hemoglobin 13.0 - 17.0 g/dL 8.5(L) 7.0(L) 9.5(L)  Hematocrit 39.0 - 52.0 % 25.0(L) 22.7(L) 31.0(L)  Platelets 150 - 400 K/uL - 101(L) 124(L)    . CMP Latest Ref Rng & Units 05/03/2021 05/03/2021 05/02/2021  Glucose 70 - 99 mg/dL 169(H) 170(H) 165(H)  BUN 8 - 23 mg/dL >130(H) 157(H) 103(H)  Creatinine 0.61 - 1.24 mg/dL 9.40(H) 8.80(H) 7.65(H)  Sodium 135 - 145 mmol/L 138 138 136  Potassium 3.5 - 5.1 mmol/L 4.8 5.2(H) 5.0  Chloride 98 - 111 mmol/L 99 97(L) 93(L)  CO2 22 - 32 mmol/L - 21(L) 29  Calcium 8.9 - 10.3 mg/dL - 8.1(L) 8.6(L)  Total Protein 6.5 - 8.1 g/dL - 6.0(L) 6.9  Total Bilirubin 0.3 - 1.2 mg/dL - 0.7 0.6  Alkaline Phos 38 - 126 U/L - 31(L) 44  AST 15 - 41 U/L - 25 22  ALT 0 - 44 U/L - 15 15   Studies/Results: CT Abdomen Pelvis Wo Contrast  Result Date: 05/02/2021 CLINICAL DATA:  Epigastric and diffuse abdominal pain. Episode of vomiting this morning. Recently diagnosed with H. pylori infection. EXAM: CT ABDOMEN AND PELVIS WITHOUT CONTRAST TECHNIQUE: Multidetector CT imaging of the abdomen and pelvis was performed following the standard protocol without IV contrast. COMPARISON:  02/15/2021 FINDINGS: Lower  chest: Stable enlarged heart. Dense coronary artery calcifications. Post CABG changes. Clear lung bases. Hepatobiliary: No focal liver abnormality is seen. Status post cholecystectomy. No biliary dilatation. Small amount of interval biliary air. Pancreas: Unremarkable. No pancreatic ductal dilatation or surrounding inflammatory changes. Spleen: Normal in size without focal abnormality. Adrenals/Urinary Tract: Unremarkable adrenal glands. Dense bilateral renal vascular calcifications with no definite calculi seen. Small right renal cysts, including a small hemorrhagic or proteinaceous cyst. Unremarkable ureters and urinary bladder. Stomach/Bowel: Multiple sigmoid colon diverticula without evidence of diverticulitis. Normal appearing appendix. Unremarkable stomach and small bowel. Vascular/Lymphatic: Extensive dense atheromatous arterial calcifications, including severe calcifications throughout the proximal superior mesenteric artery and both proximal renal arteries. No aneurysm seen. No enlarged lymph nodes. Reproductive: Minimally enlarged prostate gland. Other: Moderate left and  minimal right inguinal hernias containing fat. Musculoskeletal: Left hip fixation hardware. Old, healed bilateral rib fractures. Lumbar and lower thoracic spine degenerative changes. IMPRESSION: 1. No acute abnormality. 2. Extensive, severe calcified atherosclerotic changes, as described above. These include the coronary arteries, superior mesenteric artery and renal arteries. 3. Sigmoid colon diverticulosis. 4. Stable cardiomegaly. Electronically Signed   By: Claudie Revering M.D.   On: 05/02/2021 12:42   DG Chest Portable 1 View  Result Date: 05/03/2021 CLINICAL DATA:  Vomiting and blood in stool, initial encounter EXAM: PORTABLE CHEST 1 VIEW COMPARISON:  03/04/2021 FINDINGS: Cardiac shadow is enlarged. Postsurgical changes are again seen. Aortic calcifications are noted. The lungs are clear bilaterally. Chronic rib fractures on the  right are noted. IMPRESSION: No acute abnormality noted. Electronically Signed   By: Inez Catalina M.D.   On: 05/03/2021 01:15    Active Problems:   Shock ([place])   GI bleed   Thrombocytopenia ([place])    [name] Savoy, NP-C @  05/03/2021, 9:26 AM

## 2021-05-03 NOTE — ED Notes (Signed)
Pt BP 83/55 (62) started NS bolus notified provider.

## 2021-05-03 NOTE — H&P (Deleted)
NAME:  Jacob [name], MRN:  [id number], DOB:  [birth year], LOS: 0 ADMISSION DATE:  05/03/2021, CONSULTATION DATE:  05/03/2021 REFERRING MD:  Veatrice Kells - EDP CHIEF COMPLAINT:  Hematemesis/Melena  History of Present Illness:  Jacob [name] is an 70 y.o. who presented to [facility] ED 7/11 with c/o blood in emesis and stool.  PMHx significant for HTN, T2DM, COPD, combined systolic/diastolic CHF, CAD with MI [2004/2005, s/p CABG x 2 (2012) and DES x 1 (2021)], ESRD (on HD MWF), GERD with recent H. Pylori diagnosis and GI bleeding (s/p multiple EGD), HIV (well-controlled, diagnoses 1995), remote Hepatitis B (1968), gout, glaucoma.  Initially presented to [facility] ED 7/10AM via EMS with c/o SOB, epigastric pain and brown emesis. Lipase was elevated at 87 (appeared to be chronic). CT A/P was obtained demonstrating atherosclerotic disease involving the SMA and renal arteries. Patient able to eat and drink and was not dyspneic on exam in the ED; subsequently discharged home.  Patient returned to ED via EMS 7/10PM with bloody emesis and melena. Reported one episode of bloody/dark emesis, 3 black bowel movements and LUQ pain.   ED workup was significant for a +FOBT, Hgb of 7, Plt 101, lactate 2.3, AG 20, INR 1.3. CXR negative for infiltrate. CT A/P demonstrated extensive atherosclerosis and sigmoid diverticulosis, no other acute findings. COVID -, BCx pending, 2U PRBC was ordered with appropriate response. Protonix infusion, octreotide and DDAVP ordered.  PCCM was consulted for admission.  Pertinent Medical History:  Chronic combined systolic and diastolic CHF, COPD, CAD, CABG x2 2012, Coronary stent placement 2021, ESRD MWF dialysis, recent H. Pylori diagnosis, GERD, GI bleeding, EGD (in 2020, 21 (x2), 22 (X2)) Glaucoma, Hepatitis B (1968), gout,  HTN , HIV (dx'd 1995), Myocardial infarction  (~ 2004/2005), DM2  Significant Hospital Events: Including procedures, antibiotic start and stop dates in addition to  other pertinent events   7/11 Admit. CT A/P with sigmoid diverticulosis, no other acute findings. 2U PRBC with appropriate response. BCx pending. Nephro c/s for HD, GI c/s for ?EGD. Somnolent.  Interim History / Subjective:  No significant events overnight Received 2U PRBCs for Hgb 7.0 with appropriate response Drowsy this morning, dozing off during conversation but answering questions appropriately Denies fever/chills, CP/SOB, n/v or abdominal pain Reports that he did go to dialysis Friday (7/8) per usual schedule  Objective   Blood pressure (!) 139/55, pulse 74, temperature 98.6 F (37 C), temperature source Oral, resp. rate 12, weight 74.6 kg, SpO2 99 %. On RA        Intake/Output Summary (Last 24 hours) at 05/03/2021 0835 Last data filed at 05/03/2021 0755 Gross per 24 hour  Intake 1302.03 ml  Output --  Net 1302.03 ml    Filed Weights   05/03/21 0500  Weight: 74.6 kg   Physical Examination: General: Chronically ill-appearing elderly gentleman, in NAD. HEENT: [place]/AT, anicteric sclera, PERRL, moist mucous membranes. Neuro:  Drowsy, but wakes easily to voice. Answering questions appropriately but dozing off during conversation.  Responds to verbal stimuli. Following commands consistently. Moves all 4 extremities spontaneously. CV: RRR, no m/g/r. PULM: Breathing even and unlabored on RA. Lung fields CTAB in upper fields, slight bibasilar crackles noted. GI: Soft, nontender, nondistended. Normoactive bowel sounds. Extremities: No LE edema noted. LUE AVF noted with +thrill, bruit. Skin: Warm/dry, no rashes.  Resolved Hospital Problem List:    Assessment & Plan:  Hemorrhagic shock in the setting of GIB?Upper vs lower, unclear etiology. +FOBT, Hgb 7 +H. Pylori (03/2021) History  of GERD, GIB Presented 7/10AM with SOB, brown emesis - later re-presented to ED with hematemesis/melena. Patient unfortunately has had several past occurrences of GIB with multiple EGDs. CT A/P 7/10  demonstrated sigmoid diverticulosis, no other acute findings. Query sequelae of H. Pylori infection, PUD vs. diverticular bleed. S/p 2U PRBCs 7/11AM and DDAVP. - GI consulted, appreciate recommendations - Continue octreotide, Protonix BID - Possible EGD, timing pending GI recs - S/p 2U PRBCs with appropriate bump in Hgb - Trend H&H, transfuse for Hgb < 7.0 - Trend LA - Goal MAP > 65  Acute Blood Loss Anemia Thrombocytopenia In the setting of GIB. Hgb 9.5 > 7.0, +FOBT. S/p 2U PRBCs since admission. - Trend CBC - Transfuse for Hgb < 7.0, Plt < 10K  Acute encephalopathy in the setting of hemorrhagic shock, ABLA, ESRD Drowsy/somnolent, likely multifactorial in the setting of the above. BUN increasing in the setting of GIB, HD need. - Trend BMP, specifically BUN in the setting of GIB - Hopeful for HD today, 7/11 - Monitor neurologic status closely  ESRD on hemodialysis Hyperkalemia Dialyzes at [facility] MWF via LUE AVF. EDW per last Nephrology note 75kg. Last session 7/8 prior to admission. - Nephrology consulted for dialysis, appreciate assistance - Plan for HD today, if able - Trend BMP - Monitor I&Os - Avoid nephrotoxic agents as able - Ensure adequate renal perfusion  HX CHF HX CAD, S/P CABG and stent placement HX HTN HX HLD - Holding AC/antiplatelet medications in the setting of active GIB - Hold home antihypertensives, given hypotension - Continue Lipitor - Resume medications as clinically appropriate  HX COPD - Continue Ellipta - DuoNebs PRN  HX HIV On Biktarvy 50-200-25 - Continue Biktarvy  DM2 - Continue SSI (Renal) - CBGs Q4H while NPO  Best Practice (right click and "Reselect all SmartList Selections" daily)   Diet/type: NPO DVT prophylaxis: SCD GI prophylaxis: PPI Lines: N/A Foley:  N/A Code Status:  full code Last date of multidisciplinary goals of care discussion [Pending]  Critical care time: 35 minutes   Lestine Mount, PA-C [facility] 05/03/21 9:01 AM  Please see [email or web address] for pager details.  From 7A-7P if no response, please call [phone] After hours, please call ELink [phone]

## 2021-05-03 NOTE — Consult Note (Signed)
[facility] Renal Consultation Note    Indication for Consultation:  Management of ESRD/hemodialysis; anemia, hypertension/volume and secondary hyperparathyroidism  EXH:[id number], [name], NP  HPI: Jacob [name] is a 70 y.o. male with ESRD on HD MWF at [facility].  Past medical history significant for DM2, CAD s/p CABG x2 (2012) and stent placement in 8938, combined systolic/diastolic HF, HTN, gout, COPD, HIV, recent H pylori diagnosis and recurrent GIB.  Of note patient has been compliant with prescribed dialysis regimen, completing last full HD on 04/30/21.    Patient seen and examined at bedside.  Majority of history obtained from chart review due to patient lethargy.  Wakes to verbal stimuli but continuously falls asleep during conversation.  Denies CP, SOB, abdominal pain, bleeding, and n/v/d.    Initially presented to ED on 7/10 due to SOB, epigastric pain and brown emesis.  Abd CT with no acute findings, symptoms improved and patient discharged home.  Returned to ED later that evening due to LUA pain with bloody emesis and stool.  Pertinent findings in work up include hypotension, BUN 157, K 5.2>4.8, Hgb drop 9.5>7.0, +FOBT and CXR with no acute abnormalities. Patient admitted for further evaluation and management.   Past Medical History:  Diagnosis Date   Acute respiratory failure ([place]) 03/01/2018   Anemia    Arthritis    "all over; mostly knees and back" (02/28/2018)   Chronic combined systolic and diastolic CHF (congestive heart failure) (HCC)    Chronic lower back pain    stenosis   Community acquired pneumonia 09/06/2013   COPD (chronic obstructive pulmonary disease) ([name])    Coronary atherosclerosis of native coronary artery    a. 02/2003 s/p CABG x 2 (VG->RI, VG->RPDA; b. 11/2019 PCI: LM nl, LAD 90d, D3 50, RI 100, LCX 100p, OM3 100 - fills via L->L collats from D2/dLAD, RCA 100p, VG->RPDA ok, VG->RI 95 (3.5x48 Synergy XD DES).   Drug abuse ([place])    hx; tested for cocaine  as recently as 2/08. says he is not using drugs now - avoided defib. for this reason    ESRD (end stage renal disease) ([place])    Hemo M-W-F- [facility]   Fall at home 10/2020   GERD (gastroesophageal reflux disease)    takes OTC meds as needed   GI bleeding    a. 11/2019 EGD: angiodysplastic lesions w/ bleeding s/p argon plasma/clipping/epi inj. Multiple admissions for the same.   Glaucoma    uses eye drops daily   Hepatitis B 1968   "tx'd w/isolation; caught it from toilet stools in gym"   History of blood transfusion 03/01/2019   History of colon polyps    benign   History of gout    takes Allopurinol daily as well as Colchicine-if needed (02/28/2018)   History of kidney stones    HTN (hypertension)    takes Coreg,Imdur.and Apresoline daily   Human immunodeficiency virus (HIV) disease ([place]) dx'd 1995   on [place] as of 12/2020.     Hyperlipidemia    Ischemic cardiomyopathy    a. 01/2019 Echo: EF 40-45%, diffuse HK, mild basal septal hypertrophy. Diast dysfxn. Nl RV size/fxn. Sev dil LA. Triv MR/TR/PR.   Muscle spasm    takes Zanaflex as needed   Myocardial infarction ([place]) ~ 2004/2005   Nocturia    Peripheral neuropathy    takes gabapentin daily   Pneumonia    "at least twice" (02/28/2018)   SDH (subdural hematoma) (HCC)    Syphilis, unspecified  Type II diabetes mellitus ([place]) 2004   Lantus daily.Average fasting blood sugar 125-199   Wears glasses    Wears partial dentures    Past Surgical History:  Procedure Laterality Date   AV FISTULA PLACEMENT Left 08/02/2018   Procedure: ARTERIOVENOUS (AV) FISTULA CREATION  left arm radiocephlic;  Surgeon: Marty Heck, MD;  Location: [place];  Service: Vascular;  Laterality: Left;   AV FISTULA PLACEMENT Left 08/01/2019   Procedure: LEFT BRACHIOCEPHALIC ARTERIOVENOUS (AV) FISTULA CREATION;  Surgeon: [name], MD;  Location: [place];  Service: Vascular;  Laterality: Left;   [place] Left 10/03/2019    Procedure: BASILIC VEIN TRANSPOSITION LEFT SECOND STAGE;  Surgeon: [name], MD;  Location: [place];  Service: Vascular;  Laterality: Left;   BIOPSY  01/25/2021   Procedure: BIOPSY;  Surgeon: Doran Stabler, MD;  Location: [facility] ENDOSCOPY;  Service: Gastroenterology;;   CARDIAC CATHETERIZATION  10/2002; 12/19/2004   [name] 03/08/2011   COLONOSCOPY  2013   [place]    CORONARY ARTERY BYPASS GRAFT  02/24/2003   CABG X2/notes 03/08/2011   CORONARY STENT INTERVENTION N/A 12/19/2019   Procedure: CORONARY STENT INTERVENTION;  Surgeon: Jettie Booze, MD;  Location: [place] CV LAB;  Service: Cardiovascular;  Laterality: N/A;   ENTEROSCOPY N/A 01/25/2021   Procedure: ENTEROSCOPY;  Surgeon: Doran Stabler, MD;  Location: [place];  Service: Gastroenterology;  Laterality: N/A;   ENTEROSCOPY N/A 02/13/2021   Procedure: ENTEROSCOPY;  Surgeon: Jackquline Denmark, MD;  Location: [facility] ENDOSCOPY;  Service: Endoscopy;  Laterality: N/A;   ESOPHAGOGASTRODUODENOSCOPY (EGD) WITH PROPOFOL N/A 02/08/2019   Procedure: ESOPHAGOGASTRODUODENOSCOPY (EGD) WITH PROPOFOL;  Surgeon: Milus Banister, MD;  Location: [place];  Service: Gastroenterology;  Laterality: N/A;   ESOPHAGOGASTRODUODENOSCOPY (EGD) WITH PROPOFOL N/A 12/22/2019   Procedure: ESOPHAGOGASTRODUODENOSCOPY (EGD) WITH PROPOFOL;  Surgeon: Lavena Bullion, DO;  Location: [place];  Service: Gastroenterology;  Laterality: N/A;   ESOPHAGOGASTRODUODENOSCOPY (EGD) WITH PROPOFOL N/A 10/19/2020   Procedure: ESOPHAGOGASTRODUODENOSCOPY (EGD) WITH PROPOFOL;  Surgeon: Jackquline Denmark, MD;  Location: [facility] ENDOSCOPY;  Service: Endoscopy;  Laterality: N/A;   ESOPHAGOGASTRODUODENOSCOPY (EGD) WITH PROPOFOL N/A 12/22/2020   Procedure: ESOPHAGOGASTRODUODENOSCOPY (EGD) WITH PROPOFOL;  Surgeon: Gatha Mayer, MD;  Location: [place];  Service: Endoscopy;  Laterality: N/A;   ESOPHAGOGASTRODUODENOSCOPY (EGD) WITH PROPOFOL N/A 01/09/2021   Procedure:  ESOPHAGOGASTRODUODENOSCOPY (EGD) WITH PROPOFOL;  Surgeon: Irene Shipper, MD;  Location: [facility] ENDOSCOPY;  Service: Endoscopy;  Laterality: N/A;   HEMOSTASIS CLIP PLACEMENT  12/22/2019   Procedure: HEMOSTASIS CLIP PLACEMENT;  Surgeon: Lavena Bullion, DO;  Location: [place] ENDOSCOPY;  Service: Gastroenterology;;   HEMOSTASIS CLIP PLACEMENT  12/22/2020   Procedure: HEMOSTASIS CLIP PLACEMENT;  Surgeon: Gatha Mayer, MD;  Location: [facility] ENDOSCOPY;  Service: Endoscopy;;   HEMOSTASIS CONTROL  12/22/2020   Procedure: HEMOSTASIS CONTROL/hemospray;  Surgeon: Gatha Mayer, MD;  Location: [place];  Service: Endoscopy;;   HOT HEMOSTASIS N/A 02/08/2019   Procedure: HOT HEMOSTASIS (ARGON PLASMA COAGULATION/BICAP);  Surgeon: Milus Banister, MD;  Location: [facility] ENDOSCOPY;  Service: Gastroenterology;  Laterality: N/A;   HOT HEMOSTASIS N/A 12/22/2019   Procedure: HOT HEMOSTASIS (ARGON PLASMA COAGULATION/BICAP);  Surgeon: Lavena Bullion, DO;  Location: [facility] ENDOSCOPY;  Service: Gastroenterology;  Laterality: N/A;   HOT HEMOSTASIS N/A 10/19/2020   Procedure: HOT HEMOSTASIS (ARGON PLASMA COAGULATION/BICAP);  Surgeon: Jackquline Denmark, MD;  Location: [facility] ENDOSCOPY;  Service: Endoscopy;  Laterality: N/A;   HOT HEMOSTASIS N/A 12/22/2020   Procedure:  HOT HEMOSTASIS (ARGON PLASMA COAGULATION/BICAP);  Surgeon: Gatha Mayer, MD;  Location: [facility] ENDOSCOPY;  Service: Endoscopy;  Laterality: N/A;   HOT HEMOSTASIS N/A 01/09/2021   Procedure: HOT HEMOSTASIS (ARGON PLASMA COAGULATION/BICAP);  Surgeon: Irene Shipper, MD;  Location: [facility] ENDOSCOPY;  Service: Endoscopy;  Laterality: N/A;   HOT HEMOSTASIS N/A 01/25/2021   Procedure: HOT HEMOSTASIS (ARGON PLASMA COAGULATION/BICAP);  Surgeon: Doran Stabler, MD;  Location: [place];  Service: Gastroenterology;  Laterality: N/A;   HOT HEMOSTASIS N/A 02/13/2021   Procedure: HOT HEMOSTASIS (ARGON PLASMA COAGULATION/BICAP);  Surgeon: Jackquline Denmark, MD;  Location: [facility] ENDOSCOPY;  Service:  Endoscopy;  Laterality: N/A;   INTERTROCHANTERIC HIP FRACTURE SURGERY Left 11/2006   [name] 03/08/2011   INTRAVASCULAR ULTRASOUND/IVUS N/A 12/19/2019   Procedure: Intravascular Ultrasound/IVUS;  Surgeon: Jettie Booze, MD;  Location: [place] CV LAB;  Service: Cardiovascular;  Laterality: N/A;   IR FLUORO GUIDE CV LINE RIGHT  07/24/2019   IR FLUORO GUIDE CV LINE RIGHT  07/30/2019   IR US GUIDE VASC ACCESS RIGHT  07/24/2019   IR US GUIDE VASC ACCESS RIGHT  07/30/2019   LAPAROSCOPIC CHOLECYSTECTOMY  05/2006   LIGATION OF COMPETING BRANCHES OF ARTERIOVENOUS FISTULA Left 11/05/2018   Procedure: LIGATION OF COMPETING BRANCHES OF ARTERIOVENOUS FISTULA  LEFT  ARM;  Surgeon: Marty Heck, MD;  Location: [facility] OR;  Service: Vascular;  Laterality: Left;   LUMBAR LAMINECTOMY/DECOMPRESSION MICRODISCECTOMY N/A 02/29/2016   Procedure: Left L4-5 Lateral Recess Decompression, Removal Extradural Intraspinal Facet Cyst;  Surgeon: Marybelle Killings, MD;  Location: [place];  Service: Orthopedics;  Laterality: N/A;   MULTIPLE TOOTH EXTRACTIONS     ORIF MANDIBULAR FRACTURE Left 08/13/2004   ORIF of left body fracture mandible with KLS Martin 2.3-mm six hole/notes 03/08/2011   RIGHT/LEFT HEART CATH AND CORONARY/GRAFT ANGIOGRAPHY N/A 12/19/2019   Procedure: RIGHT/LEFT HEART CATH AND CORONARY/GRAFT ANGIOGRAPHY;  Surgeon: Jettie Booze, MD;  Location: [place] CV LAB;  Service: Cardiovascular;  Laterality: N/A;   SCLEROTHERAPY  12/22/2019   Procedure: SCLEROTHERAPY;  Surgeon: Lavena Bullion, DO;  Location: [place] ENDOSCOPY;  Service: Gastroenterology;;   Clide Deutscher  02/13/2021   Procedure: Clide Deutscher;  Surgeon: Jackquline Denmark, MD;  Location: [facility] ENDOSCOPY;  Service: Endoscopy;;   Family History  Problem Relation Age of Onset   Heart failure Father    Hypertension Father    Diabetes Brother    Heart attack Brother    Alzheimer's disease Mother    Stroke Sister    Diabetes Sister    Alzheimer's disease  Sister    Hypertension Brother    Diabetes Brother    Drug abuse Brother    Colon cancer Neg Hx    Social History:  reports that he has been smoking cigarettes. He has a 21.50 pack-year smoking history. He has never used smokeless tobacco. He reports previous alcohol use of about 12.0 standard drinks of alcohol per week. He reports previous drug use. Drug: Cocaine. Allergies  Allergen Reactions   Augmentin [Amoxicillin-Pot Clavulanate] Diarrhea and Other (See Comments)    Severe diarrhea   Mucinex Fast-Max Other (See Comments)    Intense sweating    Amphetamines Other (See Comments)    Unknown reaction   Prior to Admission medications   Medication Sig Start Date End Date Taking? Authorizing Provider  acetaminophen (TYLENOL) 650 MG CR tablet Take 1,300 mg by mouth 3 (three) times daily.   Yes [provider]  allopurinol (ZYLOPRIM) 100 MG tablet TAKE 1 TABLET(100  MG) BY MOUTH DAILY Patient taking differently: Take 100 mg by mouth daily. 01/12/21  Yes [name], Dinah C, NP  ANORO ELLIPTA 62.5-25 MCG/INH AEPB INHALE 1 PUFF BY MOUTH EVERY DAY Patient taking differently: Inhale 1 puff into the lungs daily. 12/21/20  Yes [name], NP  aspirin EC 81 MG tablet Take 81 mg by mouth daily. Swallow whole.   Yes [provider]  atorvastatin (LIPITOR) 10 MG tablet Take 1 tablet (10 mg total) by mouth daily. 03/02/21 05/31/21 Yes Dunn, Dayna N, PA-C  bictegravir-emtricitabine-tenofovir AF (BIKTARVY) 50-200-25 MG TABS tablet Take 1 tablet by mouth daily. 06/30/20  Yes [name], MD  Calcium Acetate 667 MG TABS Take [phone] mg by mouth See admin instructions. Take 1,334 mg by mouth three times a day with meals and 667 mg with each snack 01/20/21  Yes [provider]  carvedilol (COREG) 3.125 MG tablet TAKE 1 TABLET(3.125 MG) BY MOUTH TWICE DAILY Patient taking differently: Take 3.125 mg by mouth 2 (two) times daily. 04/14/21  Yes [name], MD  colchicine 0.6 MG  tablet TAKE 1 TABLET BY MOUTH AS NEEDED FOR GOUT Patient taking differently: Take 0.6 mg by mouth as needed (gout flares). 04/14/21  Yes [name], Dinah C, NP  diclofenac Sodium (VOLTAREN) 1 % GEL APPLY 2 GRAMS TOPICALLY TO THE AFFECTED AREA THREE TIMES DAILY AS NEEDED FOR PAIN Patient taking differently: Apply 2 g topically 3 (three) times daily as needed (pain). 04/14/21  Yes [name], NP  folic acid (FOLVITE) 1 MG tablet Take 1 tablet (1 mg total) by mouth daily. 01/12/21  Yes [name], Dinah C, NP  gabapentin (NEURONTIN) 300 MG capsule Take 1 capsule (300 mg total) by mouth daily. Dose change due to renal function 12/09/20  Yes Eubanks, [name], NP  insulin lispro (HUMALOG) 100 UNIT/ML KwikPen INJECT 20 UNITS UNDER THE SKIN THREE TIMES DAILY AS NEEDED FOR HIGH BLOOD SUGAR(ABOVE 150) Patient taking differently: Inject 20 Units into the skin 3 (three) times daily as needed (blood sugar over 150). 02/01/21  Yes [name], NP  isosorbide mononitrate (IMDUR) 30 MG 24 hr tablet TAKE 1 TABLET BY MOUTH EVERY DAY Patient taking differently: Take 30 mg by mouth daily. 01/12/21  Yes [name], Dinah C, NP  latanoprost (XALATAN) 0.005 % ophthalmic solution Place 1 drop into both eyes at bedtime.   Yes [provider]  lidocaine-prilocaine (EMLA) cream Apply 1 application topically every Monday, Wednesday, and Friday with hemodialysis.   Yes [provider]  Methoxy PEG-Epoetin Beta (MIRCERA IJ) Mircera 12/25/20  Yes [provider]  multivitamin (RENA-VIT) TABS tablet Take 1 tablet by mouth daily.   Yes [provider]  nitroGLYCERIN (NITROSTAT) 0.3 MG SL tablet PLACE 1 TABLET UNDER THE TONGUE AS NEEDED FOR CHEST PAIN Patient taking differently: Place 0.3 mg under the tongue every 5 (five) minutes as needed for chest pain. 04/14/21  Yes [name], MD  oxyCODONE-acetaminophen (PERCOCET/ROXICET) 5-325 MG tablet Take 1 tablet by mouth daily as needed for severe  pain. 04/28/21  Yes Magnant, Charles L, PA-C  pantoprazole (PROTONIX) 40 MG tablet Take 1 tablet (40 mg total) by mouth 2 (two) times daily before a meal. 02/16/21  Yes Thurnell Lose, MD  polyethylene glycol (MIRALAX / GLYCOLAX) 17 g packet Take 17 g by mouth daily as needed for mild constipation. 11/07/20  Yes Aline August, MD  timolol (TIMOPTIC) 0.5 % ophthalmic solution Place 1 drop into both eyes in the  morning. 04/04/19  Yes [provider]  amoxicillin (AMOXIL) 500 MG tablet Take 1 tablet (500 mg total) by mouth 2 (two) times daily. Patient not taking: No sig reported 04/08/21   Kuppelweiser, Cassie L, RPH-CPP  B-D UF III MINI PEN NEEDLES 31G X 5 MM MISC USE FOUR TIMES DAILY 03/02/20   [name], NP  clarithromycin (BIAXIN) 500 MG tablet Take 1 tablet (500 mg total) by mouth daily. Patient not taking: No sig reported 04/08/21   Kuppelweiser, Cassie L, RPH-CPP  glucose blood (ONETOUCH ULTRA) test strip Check blood sugar three times daily. Dx:E11.40 03/02/21   [name], NP  Lancets [facility] DELICA PLUS [id number]) MISC Inject 1 Device as directed 3 (three) times daily. Dx: E11.40 09/18/19   [name], NP  misoprostol (CYTOTEC) 100 MCG tablet Take 1 tablet (100 mcg total) by mouth every 6 (six) hours. Patient not taking: Reported on 05/02/2021 02/16/21   Thurnell Lose, MD  sodium chloride irrigation 0.9 % irrigation Cleanse right foot ulcer with saline once daily. Patient not taking: No sig reported 03/19/21   Marzetta Board, DPM   Current Facility-Administered Medications  Medication Dose Route Frequency Provider Last Rate Last Admin   atorvastatin (LIPITOR) tablet 10 mg  10 mg Oral Daily Collier Bullock, MD   10 mg at 05/03/21 1027   bictegravir-emtricitabine-tenofovir AF (BIKTARVY) 50-200-25 MG per tablet 1 tablet  1 tablet Oral Daily Collier Bullock, MD   1 tablet at 05/03/21 1035   Chlorhexidine Gluconate Cloth 2 % PADS 6 each  6 each Topical  Daily Collier Bullock, MD   6 each at 05/03/21 0345   Chlorhexidine Gluconate Cloth 2 % PADS 6 each  6 each Topical Q0600 Danijela Vessey, Ria Comment, PA       docusate sodium (COLACE) capsule 100 mg  100 mg Oral BID PRN Collier Bullock, MD       insulin aspart (novoLOG) injection 0-15 Units  0-15 Units Subcutaneous Q4H Collier Bullock, MD   2 Units at 05/03/21 0804   latanoprost (XALATAN) 0.005 % ophthalmic solution 1 drop  1 drop Both Eyes QHS Collier Bullock, MD       octreotide (SANDOSTATIN) 500 mcg in sodium chloride 0.9 % 250 mL (2 mcg/mL) infusion  50 mcg/hr Intravenous Continuous Collier Bullock, MD 25 mL/hr at 05/03/21 0800 50 mcg/hr at 05/03/21 0800   ondansetron (ZOFRAN) injection 4 mg  4 mg Intravenous Q6H PRN Collier Bullock, MD       [START ON 05/06/2021] pantoprazole (PROTONIX) injection 40 mg  40 mg Intravenous Q12H Palumbo, April, MD       pantoprozole (PROTONIX) 80 mg /NS 100 mL infusion  8 mg/hr Intravenous Continuous Palumbo, April, MD 10 mL/hr at 05/03/21 1032 8 mg/hr at 05/03/21 1032   polyethylene glycol (MIRALAX / GLYCOLAX) packet 17 g  17 g Oral Daily PRN Collier Bullock, MD       timolol (TIMOPTIC) 0.5 % ophthalmic solution 1 drop  1 drop Both Eyes Daily Collier Bullock, MD   1 drop at 05/03/21 1036   umeclidinium-vilanterol (ANORO ELLIPTA) 62.5-25 MCG/INH 1 puff  1 puff Inhalation Daily Collier Bullock, MD   1 puff at 05/03/21 0818   Labs: Basic Metabolic Panel: Recent Labs  Lab 05/02/21 0743 05/03/21 0046 05/03/21 0101  NA 136 138 138  K 5.0 5.2* 4.8  CL 93* 97* 99  CO2 29 21*  --   GLUCOSE 165* 170* 169*  BUN 103* 157* >130*  CREATININE 7.65*  8.80* 9.40*  CALCIUM 8.6* 8.1*  --    Liver Function Tests: Recent Labs  Lab 05/02/21 0743 05/03/21 0046  AST 22 25  ALT 15 15  ALKPHOS 44 31*  BILITOT 0.6 0.7  PROT 6.9 6.0*  ALBUMIN 3.1* 2.8*   Recent Labs  Lab 05/02/21 0743  LIPASE 87*   CBC: Recent Labs  Lab 05/02/21 0743 05/03/21 0046  05/03/21 0101  WBC 5.7 6.5  --   NEUTROABS  --  4.7  --   HGB 9.5* 7.0* 8.5*  HCT 31.0* 22.7* 25.0*  MCV 90.9 92.3  --   PLT 124* 101*  --    CBG: Recent Labs  Lab 05/03/21 0408 05/03/21 0718 05/03/21 1102  GLUCAP 168* 143* 108*   Studies/Results: CT Abdomen Pelvis Wo Contrast  Result Date: 05/02/2021 CLINICAL DATA:  Epigastric and diffuse abdominal pain. Episode of vomiting this morning. Recently diagnosed with H. pylori infection. EXAM: CT ABDOMEN AND PELVIS WITHOUT CONTRAST TECHNIQUE: Multidetector CT imaging of the abdomen and pelvis was performed following the standard protocol without IV contrast. COMPARISON:  02/15/2021 FINDINGS: Lower chest: Stable enlarged heart. Dense coronary artery calcifications. Post CABG changes. Clear lung bases. Hepatobiliary: No focal liver abnormality is seen. Status post cholecystectomy. No biliary dilatation. Small amount of interval biliary air. Pancreas: Unremarkable. No pancreatic ductal dilatation or surrounding inflammatory changes. Spleen: Normal in size without focal abnormality. Adrenals/Urinary Tract: Unremarkable adrenal glands. Dense bilateral renal vascular calcifications with no definite calculi seen. Small right renal cysts, including a small hemorrhagic or proteinaceous cyst. Unremarkable ureters and urinary bladder. Stomach/Bowel: Multiple sigmoid colon diverticula without evidence of diverticulitis. Normal appearing appendix. Unremarkable stomach and small bowel. Vascular/Lymphatic: Extensive dense atheromatous arterial calcifications, including severe calcifications throughout the proximal superior mesenteric artery and both proximal renal arteries. No aneurysm seen. No enlarged lymph nodes. Reproductive: Minimally enlarged prostate gland. Other: Moderate left and minimal right inguinal hernias containing fat. Musculoskeletal: Left hip fixation hardware. Old, healed bilateral rib fractures. Lumbar and lower thoracic spine degenerative  changes. IMPRESSION: 1. No acute abnormality. 2. Extensive, severe calcified atherosclerotic changes, as described above. These include the coronary arteries, superior mesenteric artery and renal arteries. 3. Sigmoid colon diverticulosis. 4. Stable cardiomegaly. Electronically Signed   By: Claudie Revering M.D.   On: 05/02/2021 12:42   DG Chest Portable 1 View  Result Date: 05/03/2021 CLINICAL DATA:  Vomiting and blood in stool, initial encounter EXAM: PORTABLE CHEST 1 VIEW COMPARISON:  03/04/2021 FINDINGS: Cardiac shadow is enlarged. Postsurgical changes are again seen. Aortic calcifications are noted. The lungs are clear bilaterally. Chronic rib fractures on the right are noted. IMPRESSION: No acute abnormality noted. Electronically Signed   By: Inez Catalina M.D.   On: 05/03/2021 01:15    ROS:  See HPI. Unable to completed full ROS due to patient lethargy.    Physical Exam: Vitals:   05/03/21 0523 05/03/21 0600 05/03/21 0800 05/03/21 0818  BP: (!) 138/42 (!) 141/22 (!) 139/55   Pulse: 93 85 74   Resp: 13 (!) 24 12   Temp: 98.6 F (37 C)  98.6 F (37 C)   TempSrc: Oral  Oral   SpO2: 98% 98% 96% 99%  Weight:         General: WDWN male in NAD Head: NCAT sclera not icteric MMM Neck: Supple. Lungs: mostly CTAB with crackles in b/l bases Heart: RRR. No murmur, rubs or gallops.  Abdomen: soft, nontender, +BS, no guarding, no rebound tenderness.  Lower extremities:no edema b/l  Neuro: lethargic, awakes to verbal stimuli but quickly returns to sleep.  Follows commands. Dialysis Access: LU AVF +b/t  Dialysis Orders:  MWF - [facility]  4hrs, BFR 400, DFR AF 1.5,  EDW 77kg, 2K/ 2Ca  Access: LU AVF  Heparin none Mircera 100 mcg q2wks - last 230mcg on 6/22   Assessment/Plan:  Recurrent GIB - known Hx gastric/small bowel AVMs. s/p 2 units pRBC 7/11.  CT angio ABD/pelvis ordered. Likely to have repeat EGD tomorrow per GI.  Lethargy/encephalopathy- in setting of acute GIB and BUN markedly elevated at  157.  Plan for HD today and see if symptoms improve post.   ESRD - on HD MWF.  Plan for HD at bedside today.  No heparin. K 4.8.  Hypertension/volume  - Blood pressure initially soft, now improved to goal. Does not appear grossly volume overloaded although crackles noted on exam.  Under dry weight if weights correct.  Plan for UF 1-2L as tolerated.   Anemia of CKD - Hgb drop 9.5>7.0 yesterday due to #1.  Now improved to 8.4 s/p 2units pRBC.   Secondary Hyperparathyroidism - Ca in goal. Will check phos.  Not on VDRA. Continue binders, phoslo 2 AC TID and 1 with snacks when eating.   Nutrition - Renal diet w/fluid restrictions when advanced.  DM2 - per PMD  Hx CAD s/p CABG x2 (2012) and stent placement in 8978/[id number] systolic/diastolic HF - per PMD Hx COPD Hx HIV  Jen Mow, PA-C [name] 05/03/2021, 12:08 PM

## 2021-05-03 NOTE — ED Provider Notes (Signed)
[facility] EMERGENCY DEPARTMENT Provider Note   CSN: [id number] Arrival date & time: 05/03/21  9163     History Chief Complaint  Patient presents with   Abdominal Pain    Jacob [name] is a 70 y.o. male.  The history is provided by the patient. The history is limited by the condition of the patient.  Abdominal Pain Pain location:  RUQ, LUQ and periumbilical Pain quality comment:  Discomfort Pain radiates to:  Does not radiate Pain severity:  Moderate Onset quality:  Gradual Duration:  2 days Timing:  Constant Progression:  Unchanged Chronicity:  Recurrent Context: not recent travel   Relieved by:  Nothing Worsened by:  Nothing Ineffective treatments:  None tried Associated symptoms: vomiting   Associated symptoms: no fever   Risk factors: being elderly   Patient seen  yesterday for abdominal discomfort returns for same but also is having BPBPR 1 episode and hematemesis x 2.  No fevers.      Past Medical History:  Diagnosis Date   Acute respiratory failure ([place]) 03/01/2018   Anemia    Arthritis    "all over; mostly knees and back" (02/28/2018)   Chronic combined systolic and diastolic CHF (congestive heart failure) (HCC)    Chronic lower back pain    stenosis   Community acquired pneumonia 09/06/2013   COPD (chronic obstructive pulmonary disease) ([place])    Coronary atherosclerosis of native coronary artery    a. 02/2003 s/p CABG x 2 (VG->RI, VG->RPDA; b. 11/2019 PCI: LM nl, LAD 90d, D3 50, RI 100, LCX 100p, OM3 100 - fills via L->L collats from D2/dLAD, RCA 100p, VG->RPDA ok, VG->RI 95 (3.5x48 Synergy XD DES).   Drug abuse ([place])    hx; tested for cocaine as recently as 2/08. says he is not using drugs now - avoided defib. for this reason    ESRD (end stage renal disease) ([place])    Hemo M-W-F- Richarda Blade   Fall at home 10/2020   GERD (gastroesophageal reflux disease)    takes OTC meds as needed   GI bleeding    a. 11/2019 EGD: angiodysplastic lesions  w/ bleeding s/p argon plasma/clipping/epi inj. Multiple admissions for the same.   Glaucoma    uses eye drops daily   Hepatitis B 1968   "tx'd w/isolation; caught it from toilet stools in gym"   History of blood transfusion 03/01/2019   History of colon polyps    benign   History of gout    takes Allopurinol daily as well as Colchicine-if needed (02/28/2018)   History of kidney stones    HTN (hypertension)    takes Coreg,Imdur.and Apresoline daily   Human immunodeficiency virus (HIV) disease ([place]) dx'd 1995   on [place] as of 12/2020.     Hyperlipidemia    Ischemic cardiomyopathy    a. 01/2019 Echo: EF 40-45%, diffuse HK, mild basal septal hypertrophy. Diast dysfxn. Nl RV size/fxn. Sev dil LA. Triv MR/TR/PR.   Muscle spasm    takes Zanaflex as needed   Myocardial infarction ([place]) ~ 2004/2005   Nocturia    Peripheral neuropathy    takes gabapentin daily   Pneumonia    "at least twice" (02/28/2018)   SDH (subdural hematoma) (HCC)    Syphilis, unspecified    Type II diabetes mellitus ([place]) 2004   Lantus daily.Average fasting blood sugar 125-199   Wears glasses    Wears partial dentures     Patient Active Problem List   Diagnosis Date  Noted   Helicobacter pylori (H. pylori) infection 04/06/2021   Lumbar foraminal stenosis 03/30/2021   Gastric hemorrhage due to angiodysplasia of stomach    GI bleed 11/24/2020   COVID-19 virus infection 11/24/2020   Other mechanical complication of cranial or spinal infusion catheter, sequela 11/11/2020   Subdural hematoma ([place]) 11/06/2020   Fall at home, initial encounter 11/05/2020   Nicotine dependence, cigarettes, uncomplicated 28/31/5176   Alcohol abuse 11/05/2020   Unspecified trochanteric fracture of left femur, initial encounter for closed fracture ([place]) 11/05/2020   Traumatic subdural hematoma, initial encounter ([place]) 11/05/2020   Fracture of metatarsal of right foot, closed 11/05/2020   Nondisplaced fracture of greater trochanter of  left femur, initial encounter for closed fracture ([place])    Uremia    Angiodysplasia of intestine with hemorrhage    Pruritus, unspecified 10/02/2020   Iron deficiency anemia    Acute blood loss anemia    Acute on chronic anemia    Abnormal nuclear stress test 12/19/2019   Type 2 diabetes mellitus with diabetic neuropathy, unspecified ([place]) 08/07/2019   Allergy, unspecified, initial encounter 16/04/3709   Complication of vascular dialysis catheter 08/02/2019   Drug abuse counseling and surveillance of drug abuser 08/02/2019   Other specified coagulation defects ([place]) 08/02/2019   Secondary hyperparathyroidism of renal origin ([place]) 08/02/2019   Unspecified atrial fibrillation ([place]) 08/02/2019   ESRD on hemodialysis ([place])    Cocaine abuse ([place])    CHF exacerbation ([place]) 07/21/2019   Diabetic foot ulcer ([place]) 02/11/2019   AVM (arteriovenous malformation)    Melena 02/07/2019   Symptomatic anemia 02/06/2019   Hyperlipidemia associated with type 2 diabetes mellitus ([place]) 05/30/2018   Right foot ulcer ([place]) 02/28/2018   Chronic obstructive pulmonary disease ([place]) 02/28/2018   Anemia of chronic disease 11/20/2016   CHF (congestive heart failure) ([place]) 11/10/2016   History of lumbar laminectomy for spinal cord decompression 02/29/2016   Type 2 diabetes mellitus with diabetic polyneuropathy, with long-term current use of insulin ([place]) 06/17/2015   HTN (hypertension) 04/26/2015   DM (diabetes mellitus) ([place]) 04/26/2015   Ischemic cardiomyopathy 05/12/2014   Bunion of left foot 11/25/2011   Bunion, right foot 11/25/2011   Polysubstance abuse ([place]) 07/28/2011   Hip fracture, left ([place]) 04/04/2011   Closed fracture of neck of femur ([place]) 04/04/2011   Insomnia 02/18/2011   Coronary artery disease involving native heart without angina pectoris 04/20/2009   Chronic combined systolic (congestive) and diastolic (congestive) heart failure ([employer]) 04/20/2009   Mixed hyperlipidemia 11/20/2006   Gout  11/20/2006   TOBACCO ABUSE 11/20/2006   Essential hypertension 11/20/2006   Human immunodeficiency virus (HIV) disease ([place]) 09/02/2006    Past Surgical History:  Procedure Laterality Date   AV FISTULA PLACEMENT Left 08/02/2018   Procedure: ARTERIOVENOUS (AV) FISTULA CREATION  left arm radiocephlic;  Surgeon: Marty Heck, MD;  Location: [facility] OR;  Service: Vascular;  Laterality: Left;   AV FISTULA PLACEMENT Left 08/01/2019   Procedure: LEFT BRACHIOCEPHALIC ARTERIOVENOUS (AV) FISTULA CREATION;  Surgeon: [name], MD;  Location: [place];  Service: Vascular;  Laterality: Left;   BASCILIC VEIN TRANSPOSITION Left 10/03/2019   Procedure: BASILIC VEIN TRANSPOSITION LEFT SECOND STAGE;  Surgeon: [name], MD;  Location: [place];  Service: Vascular;  Laterality: Left;   BIOPSY  01/25/2021   Procedure: BIOPSY;  Surgeon: Doran Stabler, MD;  Location: [facility] ENDOSCOPY;  Service: Gastroenterology;;   CARDIAC CATHETERIZATION  10/2002; 12/19/2004   [name] 03/08/2011   COLONOSCOPY  2013   [place]    CORONARY ARTERY BYPASS GRAFT  02/24/2003   CABG X2/notes 03/08/2011   CORONARY STENT INTERVENTION N/A 12/19/2019   Procedure: CORONARY STENT INTERVENTION;  Surgeon: Jettie Booze, MD;  Location: [place] CV LAB;  Service: Cardiovascular;  Laterality: N/A;   ENTEROSCOPY N/A 01/25/2021   Procedure: ENTEROSCOPY;  Surgeon: Doran Stabler, MD;  Location: [place];  Service: Gastroenterology;  Laterality: N/A;   ENTEROSCOPY N/A 02/13/2021   Procedure: ENTEROSCOPY;  Surgeon: Jackquline Denmark, MD;  Location: [facility] ENDOSCOPY;  Service: Endoscopy;  Laterality: N/A;   ESOPHAGOGASTRODUODENOSCOPY (EGD) WITH PROPOFOL N/A 02/08/2019   Procedure: ESOPHAGOGASTRODUODENOSCOPY (EGD) WITH PROPOFOL;  Surgeon: Milus Banister, MD;  Location: [place];  Service: Gastroenterology;  Laterality: N/A;   ESOPHAGOGASTRODUODENOSCOPY (EGD) WITH PROPOFOL N/A 12/22/2019   Procedure: ESOPHAGOGASTRODUODENOSCOPY (EGD)  WITH PROPOFOL;  Surgeon: Lavena Bullion, DO;  Location: [place];  Service: Gastroenterology;  Laterality: N/A;   ESOPHAGOGASTRODUODENOSCOPY (EGD) WITH PROPOFOL N/A 10/19/2020   Procedure: ESOPHAGOGASTRODUODENOSCOPY (EGD) WITH PROPOFOL;  Surgeon: Jackquline Denmark, MD;  Location: [facility] ENDOSCOPY;  Service: Endoscopy;  Laterality: N/A;   ESOPHAGOGASTRODUODENOSCOPY (EGD) WITH PROPOFOL N/A 12/22/2020   Procedure: ESOPHAGOGASTRODUODENOSCOPY (EGD) WITH PROPOFOL;  Surgeon: Gatha Mayer, MD;  Location: [place];  Service: Endoscopy;  Laterality: N/A;   ESOPHAGOGASTRODUODENOSCOPY (EGD) WITH PROPOFOL N/A 01/09/2021   Procedure: ESOPHAGOGASTRODUODENOSCOPY (EGD) WITH PROPOFOL;  Surgeon: Irene Shipper, MD;  Location: [facility] ENDOSCOPY;  Service: Endoscopy;  Laterality: N/A;   HEMOSTASIS CLIP PLACEMENT  12/22/2019   Procedure: HEMOSTASIS CLIP PLACEMENT;  Surgeon: Lavena Bullion, DO;  Location: [place] ENDOSCOPY;  Service: Gastroenterology;;   HEMOSTASIS CLIP PLACEMENT  12/22/2020   Procedure: HEMOSTASIS CLIP PLACEMENT;  Surgeon: Gatha Mayer, MD;  Location: [facility] ENDOSCOPY;  Service: Endoscopy;;   HEMOSTASIS CONTROL  12/22/2020   Procedure: HEMOSTASIS CONTROL/hemospray;  Surgeon: Gatha Mayer, MD;  Location: [place];  Service: Endoscopy;;   HOT HEMOSTASIS N/A 02/08/2019   Procedure: HOT HEMOSTASIS (ARGON PLASMA COAGULATION/BICAP);  Surgeon: Milus Banister, MD;  Location: [facility] ENDOSCOPY;  Service: Gastroenterology;  Laterality: N/A;   HOT HEMOSTASIS N/A 12/22/2019   Procedure: HOT HEMOSTASIS (ARGON PLASMA COAGULATION/BICAP);  Surgeon: Lavena Bullion, DO;  Location: [facility] ENDOSCOPY;  Service: Gastroenterology;  Laterality: N/A;   HOT HEMOSTASIS N/A 10/19/2020   Procedure: HOT HEMOSTASIS (ARGON PLASMA COAGULATION/BICAP);  Surgeon: Jackquline Denmark, MD;  Location: [facility] ENDOSCOPY;  Service: Endoscopy;  Laterality: N/A;   HOT HEMOSTASIS N/A 12/22/2020   Procedure: HOT HEMOSTASIS (ARGON PLASMA COAGULATION/BICAP);   Surgeon: Gatha Mayer, MD;  Location: [facility] ENDOSCOPY;  Service: Endoscopy;  Laterality: N/A;   HOT HEMOSTASIS N/A 01/09/2021   Procedure: HOT HEMOSTASIS (ARGON PLASMA COAGULATION/BICAP);  Surgeon: Irene Shipper, MD;  Location: [facility] ENDOSCOPY;  Service: Endoscopy;  Laterality: N/A;   HOT HEMOSTASIS N/A 01/25/2021   Procedure: HOT HEMOSTASIS (ARGON PLASMA COAGULATION/BICAP);  Surgeon: Doran Stabler, MD;  Location: [place];  Service: Gastroenterology;  Laterality: N/A;   HOT HEMOSTASIS N/A 02/13/2021   Procedure: HOT HEMOSTASIS (ARGON PLASMA COAGULATION/BICAP);  Surgeon: Jackquline Denmark, MD;  Location: [facility] ENDOSCOPY;  Service: Endoscopy;  Laterality: N/A;   INTERTROCHANTERIC HIP FRACTURE SURGERY Left 11/2006   [name] 03/08/2011   INTRAVASCULAR ULTRASOUND/IVUS N/A 12/19/2019   Procedure: Intravascular Ultrasound/IVUS;  Surgeon: Jettie Booze, MD;  Location: [place] CV LAB;  Service: Cardiovascular;  Laterality: N/A;   IR FLUORO GUIDE CV LINE RIGHT  07/24/2019   IR FLUORO GUIDE CV LINE RIGHT  07/30/2019   IR US GUIDE VASC ACCESS RIGHT  07/24/2019   IR US GUIDE VASC ACCESS RIGHT  07/30/2019   LAPAROSCOPIC CHOLECYSTECTOMY  05/2006   LIGATION OF COMPETING BRANCHES OF ARTERIOVENOUS FISTULA Left 11/05/2018   Procedure: LIGATION OF COMPETING BRANCHES OF ARTERIOVENOUS FISTULA  LEFT  ARM;  Surgeon: Marty Heck, MD;  Location: [facility] OR;  Service: Vascular;  Laterality: Left;   LUMBAR LAMINECTOMY/DECOMPRESSION MICRODISCECTOMY N/A 02/29/2016   Procedure: Left L4-5 Lateral Recess Decompression, Removal Extradural Intraspinal Facet Cyst;  Surgeon: Marybelle Killings, MD;  Location: [place];  Service: Orthopedics;  Laterality: N/A;   MULTIPLE TOOTH EXTRACTIONS     ORIF MANDIBULAR FRACTURE Left 08/13/2004   ORIF of left body fracture mandible with KLS Martin 2.3-mm six hole/notes 03/08/2011   RIGHT/LEFT HEART CATH AND CORONARY/GRAFT ANGIOGRAPHY N/A 12/19/2019   Procedure: RIGHT/LEFT HEART CATH AND CORONARY/GRAFT  ANGIOGRAPHY;  Surgeon: Jettie Booze, MD;  Location: [place] CV LAB;  Service: Cardiovascular;  Laterality: N/A;   SCLEROTHERAPY  12/22/2019   Procedure: SCLEROTHERAPY;  Surgeon: Lavena Bullion, DO;  Location: [facility] ENDOSCOPY;  Service: Gastroenterology;;   Clide Deutscher  02/13/2021   Procedure: Clide Deutscher;  Surgeon: Jackquline Denmark, MD;  Location: [facility] ENDOSCOPY;  Service: Endoscopy;;       Family History  Problem Relation Age of Onset   Heart failure Father    Hypertension Father    Diabetes Brother    Heart attack Brother    Alzheimer's disease Mother    Stroke Sister    Diabetes Sister    Alzheimer's disease Sister    Hypertension Brother    Diabetes Brother    Drug abuse Brother    Colon cancer Neg Hx     Social History   Tobacco Use   Smoking status: Every Day    Packs/day: 0.50    Years: 43.00    Pack years: 21.50    Types: Cigarettes   Smokeless tobacco: Never  Vaping Use   Vaping Use: Never used  Substance Use Topics   Alcohol use: Not Currently    Alcohol/week: 12.0 standard drinks    Types: 12 Standard drinks or equivalent per week    Comment: occassional   Drug use: Not Currently    Types: Cocaine    Comment: hx of crack/cocaine 63yrs ago 10/01/2019- none    Home Medications Prior to Admission medications   Medication Sig Start Date End Date Taking? Authorizing Provider  acetaminophen (TYLENOL) 650 MG CR tablet Take 1,300 mg by mouth 3 (three) times daily.   Yes [provider]  allopurinol (ZYLOPRIM) 100 MG tablet TAKE 1 TABLET(100 MG) BY MOUTH DAILY Patient taking differently: Take 100 mg by mouth daily. 01/12/21  Yes [name], Dinah C, NP  ANORO ELLIPTA 62.5-25 MCG/INH AEPB INHALE 1 PUFF BY MOUTH EVERY DAY Patient taking differently: Inhale 1 puff into the lungs daily. 12/21/20  Yes [name], NP  aspirin EC 81 MG tablet Take 81 mg by mouth daily. Swallow whole.   Yes [provider]  atorvastatin (LIPITOR) 10 MG  tablet Take 1 tablet (10 mg total) by mouth daily. 03/02/21 05/31/21 Yes Dunn, Dayna N, PA-C  bictegravir-emtricitabine-tenofovir AF (BIKTARVY) 50-200-25 MG TABS tablet Take 1 tablet by mouth daily. 06/30/20  Yes [name], MD  Calcium Acetate 667 MG TABS Take [phone] mg by mouth See admin instructions. Take 1,334 mg by mouth three times a day with meals and 667 mg with each snack 01/20/21  Yes [provider]  carvedilol (COREG) 3.125 MG tablet TAKE 1 TABLET(3.125 MG) BY MOUTH TWICE DAILY Patient taking differently: Take 3.125 mg by mouth 2 (two) times daily. 04/14/21  Yes [name], MD  colchicine 0.6 MG tablet TAKE 1 TABLET BY MOUTH AS NEEDED FOR GOUT Patient taking differently: Take 0.6 mg by mouth as needed (gout flares). 04/14/21  Yes [name], Dinah C, NP  diclofenac Sodium (VOLTAREN) 1 % GEL APPLY 2 GRAMS TOPICALLY TO THE AFFECTED AREA THREE TIMES DAILY AS NEEDED FOR PAIN Patient taking differently: Apply 2 g topically 3 (three) times daily as needed (pain). 04/14/21  Yes [name], NP  folic acid (FOLVITE) 1 MG tablet Take 1 tablet (1 mg total) by mouth daily. 01/12/21  Yes [name], Dinah C, NP  gabapentin (NEURONTIN) 300 MG capsule Take 1 capsule (300 mg total) by mouth daily. Dose change due to renal function 12/09/20  Yes Eubanks, [name], NP  insulin lispro (HUMALOG) 100 UNIT/ML KwikPen INJECT 20 UNITS UNDER THE SKIN THREE TIMES DAILY AS NEEDED FOR HIGH BLOOD SUGAR(ABOVE 150) Patient taking differently: Inject 20 Units into the skin 3 (three) times daily as needed (blood sugar over 150). 02/01/21  Yes [name], NP  isosorbide mononitrate (IMDUR) 30 MG 24 hr tablet TAKE 1 TABLET BY MOUTH EVERY DAY Patient taking differently: Take 30 mg by mouth daily. 01/12/21  Yes [name], Dinah C, NP  latanoprost (XALATAN) 0.005 % ophthalmic solution Place 1 drop into both eyes at bedtime.   Yes [provider]  lidocaine-prilocaine (EMLA) cream Apply 1 application  topically every Monday, Wednesday, and Friday with hemodialysis.   Yes [provider]  Methoxy PEG-Epoetin Beta (MIRCERA IJ) Mircera 12/25/20  Yes [provider]  multivitamin (RENA-VIT) TABS tablet Take 1 tablet by mouth daily.   Yes [provider]  nitroGLYCERIN (NITROSTAT) 0.3 MG SL tablet PLACE 1 TABLET UNDER THE TONGUE AS NEEDED FOR CHEST PAIN Patient taking differently: Place 0.3 mg under the tongue every 5 (five) minutes as needed for chest pain. 04/14/21  Yes [name], MD  oxyCODONE-acetaminophen (PERCOCET/ROXICET) 5-325 MG tablet Take 1 tablet by mouth daily as needed for severe pain. 04/28/21  Yes Magnant, Charles L, PA-C  pantoprazole (PROTONIX) 40 MG tablet Take 1 tablet (40 mg total) by mouth 2 (two) times daily before a meal. 02/16/21  Yes Thurnell Lose, MD  polyethylene glycol (MIRALAX / GLYCOLAX) 17 g packet Take 17 g by mouth daily as needed for mild constipation. 11/07/20  Yes Aline August, MD  timolol (TIMOPTIC) 0.5 % ophthalmic solution Place 1 drop into both eyes in the morning. 04/04/19  Yes [provider]  amoxicillin (AMOXIL) 500 MG tablet Take 1 tablet (500 mg total) by mouth 2 (two) times daily. Patient not taking: No sig reported 04/08/21   Kuppelweiser, Cassie L, RPH-CPP  B-D UF III MINI PEN NEEDLES 31G X 5 MM MISC USE FOUR TIMES DAILY 03/02/20   [name], NP  clarithromycin (BIAXIN) 500 MG tablet Take 1 tablet (500 mg total) by mouth daily. Patient not taking: No sig reported 04/08/21   Kuppelweiser, Cassie L, RPH-CPP  glucose blood (ONETOUCH ULTRA) test strip Check blood sugar three times daily. Dx:E11.40 03/02/21   [name], NP  Lancets [facility] DELICA PLUS [id number]) MISC Inject 1 Device as directed 3 (three) times daily. Dx: E11.40 09/18/19   [name], NP  misoprostol (CYTOTEC) 100 MCG tablet Take 1 tablet (100 mcg total) by mouth  every 6 (six) hours. Patient not taking: Reported on 05/02/2021  02/16/21   Thurnell Lose, MD  sodium chloride irrigation 0.9 % irrigation Cleanse right foot ulcer with saline once daily. Patient not taking: No sig reported 03/19/21   Marzetta Board, DPM    Allergies    Augmentin [amoxicillin-pot clavulanate], Mucinex fast-max, and Amphetamines  Review of Systems   Review of Systems  Constitutional:  Negative for fever.  HENT:  Negative for drooling.   Eyes:  Negative for redness.  Respiratory:  Negative for wheezing.   Cardiovascular:  Negative for leg swelling.  Gastrointestinal:  Positive for abdominal pain, anal bleeding and vomiting.  Genitourinary:  Negative for testicular pain.  Musculoskeletal:  Negative for neck stiffness.  Skin:  Positive for pallor.  Neurological:  Negative for facial asymmetry.  Psychiatric/Behavioral:  Negative for agitation.   All other systems reviewed and are negative.  Physical Exam Updated Vital Signs BP (!) 121/43   Pulse 88   Temp (!) 97.5 F (36.4 C) (Oral)   Resp 12   SpO2 99%   Physical Exam Vitals and nursing note reviewed. Exam conducted with a chaperone present.  Constitutional:      General: He is not in acute distress.    Appearance: Normal appearance.  HENT:     Head: Normocephalic and atraumatic.     Nose: Nose normal.  Eyes:     Conjunctiva/sclera: Conjunctivae normal.     Pupils: Pupils are equal, round, and reactive to light.  Cardiovascular:     Rate and Rhythm: Normal rate and regular rhythm.     Pulses: Normal pulses.     Heart sounds: Normal heart sounds.  Pulmonary:     Effort: Pulmonary effort is normal.     Breath sounds: Normal breath sounds.  Abdominal:     General: Bowel sounds are normal.     Palpations: Abdomen is soft.     Tenderness: There is no abdominal tenderness. There is no guarding.     Hernia: No hernia is present.  Genitourinary:    Rectum: Guaiac result positive.  Musculoskeletal:        General: Normal range of motion.     Cervical back:  Normal range of motion and neck supple.  Skin:    General: Skin is warm and dry.     Capillary Refill: Capillary refill takes less than 2 seconds.  Neurological:     Mental Status: He is alert.     Deep Tendon Reflexes: Reflexes normal.  Psychiatric:        Mood and Affect: Mood normal.        Behavior: Behavior normal.    ED Results / Procedures / Treatments   Labs (all labs ordered are listed, but only abnormal results are displayed) Results for orders placed or performed during the hospital encounter of 05/03/21  Resp Panel by RT-PCR (Flu A&B, Covid) Nasopharyngeal Swab   Specimen: Nasopharyngeal Swab; Nasopharyngeal(NP) swabs in vial transport medium  Result Value Ref Range   SARS Coronavirus 2 by RT PCR NEGATIVE NEGATIVE   Influenza A by PCR NEGATIVE NEGATIVE   Influenza B by PCR NEGATIVE NEGATIVE  CBC with Differential/Platelet  Result Value Ref Range   WBC 6.5 4.0 - 10.5 K/uL   RBC 2.46 (L) 4.22 - 5.81 MIL/uL   Hemoglobin 7.0 (L) 13.0 - 17.0 g/dL   HCT 22.7 (L) 39.0 - 52.0 %   MCV 92.3 80.0 - 100.0 fL   MCH 28.5 26.0 -  34.0 pg   MCHC 30.8 30.0 - 36.0 g/dL   RDW 19.5 (H) 11.5 - 15.5 %   Platelets 101 (L) 150 - 400 K/uL   nRBC 0.0 0.0 - 0.2 %   Neutrophils Relative % 71 %   Neutro Abs 4.7 1.7 - 7.7 K/uL   Lymphocytes Relative 17 %   Lymphs Abs 1.1 0.7 - 4.0 K/uL   Monocytes Relative 9 %   Monocytes Absolute 0.6 0.1 - 1.0 K/uL   Eosinophils Relative 1 %   Eosinophils Absolute 0.1 0.0 - 0.5 K/uL   Basophils Relative 1 %   Basophils Absolute 0.0 0.0 - 0.1 K/uL   Smear Review MORPHOLOGY UNREMARKABLE    Immature Granulocytes 1 %   Abs Immature Granulocytes 0.03 0.00 - 0.07 K/uL  Comprehensive metabolic panel  Result Value Ref Range   Sodium 138 135 - 145 mmol/L   Potassium 5.2 (H) 3.5 - 5.1 mmol/L   Chloride 97 (L) 98 - 111 mmol/L   CO2 21 (L) 22 - 32 mmol/L   Glucose, Bld 170 (H) 70 - 99 mg/dL   BUN 157 (H) 8 - 23 mg/dL   Creatinine, Ser 8.80 (H) 0.61 - 1.24  mg/dL   Calcium 8.1 (L) 8.9 - 10.3 mg/dL   Total Protein 6.0 (L) 6.5 - 8.1 g/dL   Albumin 2.8 (L) 3.5 - 5.0 g/dL   AST 25 15 - 41 U/L   ALT 15 0 - 44 U/L   Alkaline Phosphatase 31 (L) 38 - 126 U/L   Total Bilirubin 0.7 0.3 - 1.2 mg/dL   GFR, Estimated 6 (L) >60 mL/min   Anion gap 20 (H) 5 - 15  Lactic acid, plasma  Result Value Ref Range   Lactic Acid, Venous 2.3 (HH) 0.5 - 1.9 mmol/L  Protime-INR  Result Value Ref Range   Prothrombin Time 15.8 (H) 11.4 - 15.2 seconds   INR 1.3 (H) 0.8 - 1.2  I-stat chem 8, ED (not at [facility] or [facility])  Result Value Ref Range   Sodium 138 135 - 145 mmol/L   Potassium 4.8 3.5 - 5.1 mmol/L   Chloride 99 98 - 111 mmol/L   BUN >130 (H) 8 - 23 mg/dL   Creatinine, Ser 9.40 (H) 0.61 - 1.24 mg/dL   Glucose, Bld 169 (H) 70 - 99 mg/dL   Calcium, Ion 0.98 (L) 1.15 - 1.40 mmol/L   TCO2 24 22 - 32 mmol/L   Hemoglobin 8.5 (L) 13.0 - 17.0 g/dL   HCT 25.0 (L) 39.0 - 52.0 %  POC occult blood, ED  Result Value Ref Range   Fecal Occult Bld POSITIVE (A) NEGATIVE  Type and screen  Result Value Ref Range   ABO/RH(D) A POS    Antibody Screen NEG    Sample Expiration 05/06/2021,2359    Unit Number [id number]    Blood Component Type RED CELLS,LR    Unit division 00    Status of Unit ALLOCATED    Transfusion Status OK TO TRANSFUSE    Crossmatch Result Compatible    Unit Number [id number]    Blood Component Type RED CELLS,LR    Unit division 00    Status of Unit ISSUED    Transfusion Status OK TO TRANSFUSE    Crossmatch Result      Compatible Performed at [facility], [address], [place], [place] [address]   Prepare RBC (crossmatch)  Result Value Ref Range   Order Confirmation  ORDER PROCESSED BY BLOOD BANK Performed at [facility], [place] [address] [place],  [address]   BP[facility]  Result Value Ref Range   Blood Product Unit Number [id number]    PRODUCT CODE S0109N23    Unit Type and Rh 6200    Blood Product  Expiration Date 557322025427    ISSUE DATE / TIME [id number]    Blood Product Unit Number [id number]    PRODUCT CODE R4854O27    Unit Type and Rh 6200    Blood Product Expiration Date [id number]    *Note: Due to a large number of results and/or encounters for the requested time period, some results have not been displayed. A complete set of results can be found in Results Review.   Epidural Steroid injection  Result Date: 04/15/2021 Magnus Sinning, MD     04/18/2021  3:11 PM Lumbar Epidural Steroid Injection - Interlaminar Approach with Fluoroscopic Guidance Patient: Jacob [name]     Date of Birth: 13-Jan-1951 MRN: [id number] PCP: [name], NP     Visit Date: 04/15/2021  Universal Protocol:   Consent Given By: the patient Position: PRONE Additional Comments: Vital signs were monitored before and after the procedure. Patient was prepped and draped in the usual sterile fashion. The correct patient, procedure, and site was verified. Injection Procedure Details: Procedure diagnoses: Lumbar radiculopathy [M54.16] Meds Administered: Meds ordered this encounter Medications  betamethasone acetate-betamethasone sodium phosphate (CELESTONE) injection 12 mg  Laterality: Right Location/Site:  L5-S1 Needle: 3.5 in., 20 ga. Tuohy Needle Placement: Paramedian epidural Findings:  -Comments: Excellent flow of contrast into the epidural space. Procedure Details: Using a paramedian approach from the side mentioned above, the region overlying the inferior lamina was localized under fluoroscopic visualization and the soft tissues overlying this structure were infiltrated with 4 ml. of 1% Lidocaine without Epinephrine. The Tuohy needle was inserted into the epidural space using a paramedian approach. The epidural space was localized using loss of resistance along with counter oblique bi-planar fluoroscopic views.  After negative aspirate for air, blood, and CSF, a 2 ml. volume of Isovue-250 was injected into  the epidural space and the flow of contrast was observed. Radiographs were obtained for documentation purposes.  The injectate was administered into the level noted above. Additional Comments: No complications occurred Dressing: 2 x 2 sterile gauze and Band-Aid  Post-procedure details: Patient was observed during the procedure. Post-procedure instructions were reviewed. Patient left the clinic in stable condition.   CT Abdomen Pelvis Wo Contrast  Result Date: 05/02/2021 CLINICAL DATA:  Epigastric and diffuse abdominal pain. Episode of vomiting this morning. Recently diagnosed with H. pylori infection. EXAM: CT ABDOMEN AND PELVIS WITHOUT CONTRAST TECHNIQUE: Multidetector CT imaging of the abdomen and pelvis was performed following the standard protocol without IV contrast. COMPARISON:  02/15/2021 FINDINGS: Lower chest: Stable enlarged heart. Dense coronary artery calcifications. Post CABG changes. Clear lung bases. Hepatobiliary: No focal liver abnormality is seen. Status post cholecystectomy. No biliary dilatation. Small amount of interval biliary air. Pancreas: Unremarkable. No pancreatic ductal dilatation or surrounding inflammatory changes. Spleen: Normal in size without focal abnormality. Adrenals/Urinary Tract: Unremarkable adrenal glands. Dense bilateral renal vascular calcifications with no definite calculi seen. Small right renal cysts, including a small hemorrhagic or proteinaceous cyst. Unremarkable ureters and urinary bladder. Stomach/Bowel: Multiple sigmoid colon diverticula without evidence of diverticulitis. Normal appearing appendix. Unremarkable stomach and small bowel. Vascular/Lymphatic: Extensive dense atheromatous arterial calcifications, including severe calcifications throughout the proximal superior mesenteric artery and both proximal  renal arteries. No aneurysm seen. No enlarged lymph nodes. Reproductive: Minimally enlarged prostate gland. Other: Moderate left and minimal right inguinal  hernias containing fat. Musculoskeletal: Left hip fixation hardware. Old, healed bilateral rib fractures. Lumbar and lower thoracic spine degenerative changes. IMPRESSION: 1. No acute abnormality. 2. Extensive, severe calcified atherosclerotic changes, as described above. These include the coronary arteries, superior mesenteric artery and renal arteries. 3. Sigmoid colon diverticulosis. 4. Stable cardiomegaly. Electronically Signed   By: Claudie Revering M.D.   On: 05/02/2021 12:42   DG Chest Portable 1 View  Result Date: 05/03/2021 CLINICAL DATA:  Vomiting and blood in stool, initial encounter EXAM: PORTABLE CHEST 1 VIEW COMPARISON:  03/04/2021 FINDINGS: Cardiac shadow is enlarged. Postsurgical changes are again seen. Aortic calcifications are noted. The lungs are clear bilaterally. Chronic rib fractures on the right are noted. IMPRESSION: No acute abnormality noted. Electronically Signed   By: Inez Catalina M.D.   On: 05/03/2021 01:15   XR C-ARM NO REPORT  Result Date: 04/15/2021 Please see Notes tab for imaging impression.    EKG None  Radiology CT Abdomen Pelvis Wo Contrast  Result Date: 05/02/2021 CLINICAL DATA:  Epigastric and diffuse abdominal pain. Episode of vomiting this morning. Recently diagnosed with H. pylori infection. EXAM: CT ABDOMEN AND PELVIS WITHOUT CONTRAST TECHNIQUE: Multidetector CT imaging of the abdomen and pelvis was performed following the standard protocol without IV contrast. COMPARISON:  02/15/2021 FINDINGS: Lower chest: Stable enlarged heart. Dense coronary artery calcifications. Post CABG changes. Clear lung bases. Hepatobiliary: No focal liver abnormality is seen. Status post cholecystectomy. No biliary dilatation. Small amount of interval biliary air. Pancreas: Unremarkable. No pancreatic ductal dilatation or surrounding inflammatory changes. Spleen: Normal in size without focal abnormality. Adrenals/Urinary Tract: Unremarkable adrenal glands. Dense bilateral renal  vascular calcifications with no definite calculi seen. Small right renal cysts, including a small hemorrhagic or proteinaceous cyst. Unremarkable ureters and urinary bladder. Stomach/Bowel: Multiple sigmoid colon diverticula without evidence of diverticulitis. Normal appearing appendix. Unremarkable stomach and small bowel. Vascular/Lymphatic: Extensive dense atheromatous arterial calcifications, including severe calcifications throughout the proximal superior mesenteric artery and both proximal renal arteries. No aneurysm seen. No enlarged lymph nodes. Reproductive: Minimally enlarged prostate gland. Other: Moderate left and minimal right inguinal hernias containing fat. Musculoskeletal: Left hip fixation hardware. Old, healed bilateral rib fractures. Lumbar and lower thoracic spine degenerative changes. IMPRESSION: 1. No acute abnormality. 2. Extensive, severe calcified atherosclerotic changes, as described above. These include the coronary arteries, superior mesenteric artery and renal arteries. 3. Sigmoid colon diverticulosis. 4. Stable cardiomegaly. Electronically Signed   By: Claudie Revering M.D.   On: 05/02/2021 12:42   DG Chest Portable 1 View  Result Date: 05/03/2021 CLINICAL DATA:  Vomiting and blood in stool, initial encounter EXAM: PORTABLE CHEST 1 VIEW COMPARISON:  03/04/2021 FINDINGS: Cardiac shadow is enlarged. Postsurgical changes are again seen. Aortic calcifications are noted. The lungs are clear bilaterally. Chronic rib fractures on the right are noted. IMPRESSION: No acute abnormality noted. Electronically Signed   By: Inez Catalina M.D.   On: 05/03/2021 01:15    Procedures Procedures   Medications Ordered in ED Medications  pantoprozole (PROTONIX) 80 mg /NS 100 mL infusion (8 mg/hr Intravenous New Bag/Given 05/03/21 0102)  pantoprazole (PROTONIX) injection 40 mg (has no administration in time range)  desmopressin (DDAVP) 20 mcg in sodium chloride 0.9 % 50 mL IVPB (has no administration  in time range)  0.9 %  sodium chloride infusion (has no administration in time range)  pantoprazole (PROTONIX)  80 mg /NS 100 mL IVPB (0 mg Intravenous Stopped 05/03/21 0121)  sodium chloride 0.9 % bolus 250 mL (0 mLs Intravenous Stopped 05/03/21 0142)    ED Course  I have reviewed the triage vital signs and the nursing notes.  Pertinent labs & imaging results that were available during my care of the patient were reviewed by me and considered in my medical decision making (see chart for details).  MDM Reviewed: previous chart, nursing note and vitals Reviewed previous: labs and CT scan Interpretation: labs, x-ray and CT scan (NACPD, elevated BUN and CR,) Total time providing critical care: 75-105 minutes (blood transfusion). This excludes time spent performing separately reportable procedures and services. Consults: critical care and gastrointestinal (secure chat sent to GI)  CRITICAL CARE Performed by: Sophiana Milanese K Babita Amaker-Rasch Total critical care time: 90 minutes Critical care time was exclusive of separately billable procedures and treating other patients. Critical care was necessary to treat or prevent imminent or life-threatening deterioration. Critical care was time spent personally by me on the following activities: development of treatment plan with patient and/or surrogate as well as nursing, discussions with consultants, evaluation of patient's response to treatment, examination of patient, obtaining history from patient or surrogate, ordering and performing treatments and interventions, ordering and review of laboratory studies, ordering and review of radiographic studies, pulse oximetry and re-evaluation of patient's condition.       Final Clinical Impression(s) / ED Diagnoses Final diagnoses:  Rectal bleeding   Admit to PCCM    Lashane Whelpley, MD 05/03/21 0246

## 2021-05-03 NOTE — Progress Notes (Signed)
This RN notified by coworker of patient c/o chest pain while off unit; MD notified and orders obtained. Upon returning to unit, patient still c/o of chest pain 4/10, oxygen [place] 2L on, EKG being obtained, fentanyl administered at this time. EKG read acute MI/STEMI; MD R. Agarwala paged and to bedside. Charge RN aware and code STEMI called.  Nelson Agarwala at bedside, EKG appears same as previous per MD. Orders placed for serial troponin.

## 2021-05-03 NOTE — Progress Notes (Signed)
Cath Lab Team/Interventional Cardiology Note:  Code STEMI activated by [facility] staff. EKG unchanged from yesterday with baseline subtle ST abnormality. This is not a change today. Pt with chest pain. He has been seen by the PCCM team today. Plans for troponin cycling in place.   Please call our consult team if a formal consultation is needed.   [name] 05/03/2021 4:26 PM

## 2021-05-03 NOTE — Progress Notes (Signed)
This RN notified by Dialysis RN that pt was complaining of chest pain. Upon assessment, patient rates pain at 4/10 and throbbing. Applied 2L [place] and notified pt's nurse Candace Cruise, RN) and Dr. Lynetta Mare. MD provided new orders of EKG and one time dose of fentanyl (see MAR). MD gave instructions to hold nitroglycerin for now, as BP 93/52.

## 2021-05-03 NOTE — Plan of Care (Signed)
  Problem: Education: Goal: Knowledge of General Education information will improve Description Including pain rating scale, medication(s)/side effects and non-pharmacologic comfort measures Outcome: Progressing   

## 2021-05-03 NOTE — ED Notes (Signed)
CT ready for pt in room 1. Pt just started blood transfusion will tell oncoming nurse to call CT when ready to transport.

## 2021-05-04 ENCOUNTER — Other Ambulatory Visit: Payer: Self-pay

## 2021-05-04 ENCOUNTER — Encounter: Payer: Self-pay | Admitting: Family

## 2021-05-04 ENCOUNTER — Ambulatory Visit: Payer: Self-pay | Admitting: Family

## 2021-05-04 ENCOUNTER — Ambulatory Visit (INDEPENDENT_AMBULATORY_CARE_PROVIDER_SITE_OTHER): Payer: Medicare Other | Admitting: Family

## 2021-05-04 ENCOUNTER — Inpatient Hospital Stay (HOSPITAL_COMMUNITY)
Admission: EM | Admit: 2021-05-04 | Discharge: 2021-05-08 | DRG: 377 | Disposition: A | Discharge status: Home / Self Care | Payer: Medicare Other | Attending: Internal Medicine | Admitting: Internal Medicine

## 2021-05-04 ENCOUNTER — Encounter (HOSPITAL_COMMUNITY): Payer: Self-pay | Admitting: Emergency Medicine

## 2021-05-04 ENCOUNTER — Encounter (HOSPITAL_COMMUNITY)
Admission: EM | Disposition: A | Discharge status: Home / Self Care | Payer: Self-pay | Source: Home / Self Care | Attending: Emergency Medicine

## 2021-05-04 ENCOUNTER — Telehealth: Payer: Self-pay | Admitting: *Deleted

## 2021-05-04 ENCOUNTER — Other Ambulatory Visit: Payer: Medicare Other

## 2021-05-04 VITALS — BP 130/70 | HR 113 | Temp 97.7°F | Resp 18 | Ht 74.0 in | Wt 175.4 lb

## 2021-05-04 DIAGNOSIS — J449 Chronic obstructive pulmonary disease, unspecified: Secondary | ICD-10-CM | POA: Diagnosis present

## 2021-05-04 DIAGNOSIS — M109 Gout, unspecified: Secondary | ICD-10-CM | POA: Diagnosis present

## 2021-05-04 DIAGNOSIS — Z7982 Long term (current) use of aspirin: Secondary | ICD-10-CM

## 2021-05-04 DIAGNOSIS — R531 Weakness: Secondary | ICD-10-CM

## 2021-05-04 DIAGNOSIS — M479 Spondylosis, unspecified: Secondary | ICD-10-CM | POA: Diagnosis present

## 2021-05-04 DIAGNOSIS — Z955 Presence of coronary angioplasty implant and graft: Secondary | ICD-10-CM

## 2021-05-04 DIAGNOSIS — Z992 Dependence on renal dialysis: Secondary | ICD-10-CM

## 2021-05-04 DIAGNOSIS — Z951 Presence of aortocoronary bypass graft: Secondary | ICD-10-CM

## 2021-05-04 DIAGNOSIS — D649 Anemia, unspecified: Secondary | ICD-10-CM

## 2021-05-04 DIAGNOSIS — G9349 Other encephalopathy: Secondary | ICD-10-CM | POA: Diagnosis present

## 2021-05-04 DIAGNOSIS — R195 Other fecal abnormalities: Secondary | ICD-10-CM

## 2021-05-04 DIAGNOSIS — K449 Diaphragmatic hernia without obstruction or gangrene: Secondary | ICD-10-CM | POA: Diagnosis present

## 2021-05-04 DIAGNOSIS — E785 Hyperlipidemia, unspecified: Secondary | ICD-10-CM | POA: Diagnosis present

## 2021-05-04 DIAGNOSIS — K219 Gastro-esophageal reflux disease without esophagitis: Secondary | ICD-10-CM | POA: Diagnosis present

## 2021-05-04 DIAGNOSIS — N2581 Secondary hyperparathyroidism of renal origin: Secondary | ICD-10-CM | POA: Diagnosis present

## 2021-05-04 DIAGNOSIS — I10 Essential (primary) hypertension: Secondary | ICD-10-CM | POA: Diagnosis present

## 2021-05-04 DIAGNOSIS — E1122 Type 2 diabetes mellitus with diabetic chronic kidney disease: Secondary | ICD-10-CM | POA: Diagnosis present

## 2021-05-04 DIAGNOSIS — R1084 Generalized abdominal pain: Secondary | ICD-10-CM

## 2021-05-04 DIAGNOSIS — I252 Old myocardial infarction: Secondary | ICD-10-CM

## 2021-05-04 DIAGNOSIS — R578 Other shock: Secondary | ICD-10-CM | POA: Diagnosis present

## 2021-05-04 DIAGNOSIS — E875 Hyperkalemia: Secondary | ICD-10-CM | POA: Diagnosis present

## 2021-05-04 DIAGNOSIS — Z888 Allergy status to other drugs, medicaments and biological substances status: Secondary | ICD-10-CM

## 2021-05-04 DIAGNOSIS — Z20822 Contact with and (suspected) exposure to covid-19: Secondary | ICD-10-CM | POA: Diagnosis present

## 2021-05-04 DIAGNOSIS — F17210 Nicotine dependence, cigarettes, uncomplicated: Secondary | ICD-10-CM | POA: Diagnosis present

## 2021-05-04 DIAGNOSIS — I255 Ischemic cardiomyopathy: Secondary | ICD-10-CM | POA: Diagnosis present

## 2021-05-04 DIAGNOSIS — D62 Acute posthemorrhagic anemia: Secondary | ICD-10-CM | POA: Diagnosis present

## 2021-05-04 DIAGNOSIS — H409 Unspecified glaucoma: Secondary | ICD-10-CM | POA: Diagnosis present

## 2021-05-04 DIAGNOSIS — Z794 Long term (current) use of insulin: Secondary | ICD-10-CM

## 2021-05-04 DIAGNOSIS — Z880 Allergy status to penicillin: Secondary | ICD-10-CM

## 2021-05-04 DIAGNOSIS — E119 Type 2 diabetes mellitus without complications: Secondary | ICD-10-CM

## 2021-05-04 DIAGNOSIS — Z87442 Personal history of urinary calculi: Secondary | ICD-10-CM

## 2021-05-04 DIAGNOSIS — Z833 Family history of diabetes mellitus: Secondary | ICD-10-CM

## 2021-05-04 DIAGNOSIS — K625 Hemorrhage of anus and rectum: Secondary | ICD-10-CM | POA: Diagnosis not present

## 2021-05-04 DIAGNOSIS — Z8249 Family history of ischemic heart disease and other diseases of the circulatory system: Secondary | ICD-10-CM

## 2021-05-04 DIAGNOSIS — B20 Human immunodeficiency virus [HIV] disease: Secondary | ICD-10-CM | POA: Diagnosis present

## 2021-05-04 DIAGNOSIS — I2510 Atherosclerotic heart disease of native coronary artery without angina pectoris: Secondary | ICD-10-CM | POA: Diagnosis present

## 2021-05-04 DIAGNOSIS — N186 End stage renal disease: Secondary | ICD-10-CM

## 2021-05-04 DIAGNOSIS — E8809 Other disorders of plasma-protein metabolism, not elsewhere classified: Secondary | ICD-10-CM | POA: Diagnosis present

## 2021-05-04 DIAGNOSIS — Z79899 Other long term (current) drug therapy: Secondary | ICD-10-CM

## 2021-05-04 DIAGNOSIS — K31811 Angiodysplasia of stomach and duodenum with bleeding: Principal | ICD-10-CM | POA: Diagnosis present

## 2021-05-04 DIAGNOSIS — Z86010 Personal history of colonic polyps: Secondary | ICD-10-CM

## 2021-05-04 DIAGNOSIS — M545 Low back pain, unspecified: Secondary | ICD-10-CM | POA: Diagnosis present

## 2021-05-04 DIAGNOSIS — K922 Gastrointestinal hemorrhage, unspecified: Secondary | ICD-10-CM | POA: Diagnosis present

## 2021-05-04 DIAGNOSIS — Z9049 Acquired absence of other specified parts of digestive tract: Secondary | ICD-10-CM

## 2021-05-04 DIAGNOSIS — D689 Coagulation defect, unspecified: Secondary | ICD-10-CM | POA: Diagnosis present

## 2021-05-04 DIAGNOSIS — D6959 Other secondary thrombocytopenia: Secondary | ICD-10-CM | POA: Diagnosis present

## 2021-05-04 DIAGNOSIS — Q2730 Arteriovenous malformation, site unspecified: Secondary | ICD-10-CM

## 2021-05-04 DIAGNOSIS — D631 Anemia in chronic kidney disease: Secondary | ICD-10-CM | POA: Diagnosis present

## 2021-05-04 DIAGNOSIS — I132 Hypertensive heart and chronic kidney disease with heart failure and with stage 5 chronic kidney disease, or end stage renal disease: Secondary | ICD-10-CM | POA: Diagnosis present

## 2021-05-04 DIAGNOSIS — E1142 Type 2 diabetes mellitus with diabetic polyneuropathy: Secondary | ICD-10-CM | POA: Diagnosis present

## 2021-05-04 DIAGNOSIS — I5042 Chronic combined systolic (congestive) and diastolic (congestive) heart failure: Secondary | ICD-10-CM | POA: Diagnosis present

## 2021-05-04 DIAGNOSIS — M170 Bilateral primary osteoarthritis of knee: Secondary | ICD-10-CM | POA: Diagnosis present

## 2021-05-04 DIAGNOSIS — G8929 Other chronic pain: Secondary | ICD-10-CM | POA: Diagnosis present

## 2021-05-04 LAB — BASIC METABOLIC PANEL
Anion gap: 11 (ref 5–15)
BUN: 115 mg/dL — ABNORMAL HIGH (ref 8–23)
CO2: 25 mmol/L (ref 22–32)
Calcium: 7.8 mg/dL — ABNORMAL LOW (ref 8.9–10.3)
Chloride: 100 mmol/L (ref 98–111)
Creatinine, Ser: 6.96 mg/dL — ABNORMAL HIGH (ref 0.61–1.24)
GFR, Estimated: 8 mL/min — ABNORMAL LOW (ref >60)
Glucose, Bld: 117 mg/dL — ABNORMAL HIGH (ref 70–99)
Potassium: 5.4 mmol/L — ABNORMAL HIGH (ref 3.5–5.1)
Sodium: 136 mmol/L (ref 135–145)

## 2021-05-04 LAB — CBC
HCT: 21.6 % — ABNORMAL LOW (ref 39.0–52.0)
Hemoglobin: 7.2 g/dL — ABNORMAL LOW (ref 13.0–17.0)
MCH: 29.6 pg (ref 26.0–34.0)
MCHC: 33.3 g/dL (ref 30.0–36.0)
MCV: 88.9 fL (ref 80.0–100.0)
Platelets: 79 10*3/uL — ABNORMAL LOW (ref 150–400)
RBC: 2.43 MIL/uL — ABNORMAL LOW (ref 4.22–5.81)
RDW: 17.6 % — ABNORMAL HIGH (ref 11.5–15.5)
WBC: 5.6 10*3/uL (ref 4.0–10.5)
nRBC: 0 % (ref 0.0–0.2)

## 2021-05-04 LAB — GLUCOSE, CAPILLARY
Glucose-Capillary: 98 mg/dL (ref 70–99)
Glucose-Capillary: 117 mg/dL — ABNORMAL HIGH (ref 70–99)

## 2021-05-04 LAB — PHOSPHORUS: Phosphorus: 5.3 mg/dL — ABNORMAL HIGH (ref 2.5–4.6)

## 2021-05-04 LAB — TROPONIN I (HIGH SENSITIVITY): Troponin I (High Sensitivity): 49 ng/L — ABNORMAL HIGH (ref <18)

## 2021-05-04 LAB — MAGNESIUM: Magnesium: 2.1 mg/dL (ref 1.7–2.4)

## 2021-05-04 SURGERY — CANCELLED PROCEDURE
Anesthesia: Monitor Anesthesia Care

## 2021-05-04 NOTE — ED Notes (Signed)
Pt left AMA.did not want to wait.

## 2021-05-04 NOTE — Patient Instructions (Signed)
Send to ED for evaluation of dark stool,shortness of breath and generalized weakness

## 2021-05-04 NOTE — Telephone Encounter (Signed)
Patient called and stated that he was just released from the hospital due to [facility] stools and they told him to follow up with his PCP.   I offered patient an appointment to follow up with [place] today with Dinah and patient refused. I offered a day everyday this week and patient wasn't able to make any day and wanted to know what [name] wanted him to do about the black stools.   Stated that he has been having them for about 2 days now and weak.   Patient called back while typing note and scheduled an appointment with Dinah for this afternoon.

## 2021-05-04 NOTE — Discharge Summary (Signed)
Physician Discharge Summary      [name] MRN: [id number] DOB/AGE: [birth year] 70 y.o.  Admit Date: 05/03/2021 Discharge Date: 05/04/2021  Discharge Diagnoses:  Active Problems:   Shock ([place])   GI bleed   Thrombocytopenia ([place])  History of Present Illness: Jacob [name] is an 70 y.o. who presented to [facility] ED 7/11 with c/o blood in emesis and stool.   Hospital Course:  Jacob [name] is an 70 y.o. who presented to [facility] ED 7/11 with c/o blood in emesis and stool.   PMHx significant for HTN, T2DM, COPD, combined systolic/diastolic CHF, CAD with MI [2004/2005, s/p CABG x 2 (2012) and DES x 1 (2021)], ESRD (on HD MWF), GERD with recent H. Pylori diagnosis and GI bleeding (s/p multiple EGD), HIV (well-controlled, diagnoses 1995), remote Hepatitis B (1968), gout, glaucoma.   Initially presented to [facility] ED 7/10AM via EMS with c/o SOB, epigastric pain and brown emesis. Lipase was elevated at 87 (appeared to be chronic). CT A/P was obtained demonstrating atherosclerotic disease involving the SMA and renal arteries. Patient able to eat and drink and was not dyspneic on exam in the ED; subsequently discharged home. Patient returned to ED via EMS 7/10PM with bloody emesis and melena. Reported one episode of bloody/dark emesis, 3 black bowel movements and LUQ pain.   ED workup was significant for a +FOBT, Hgb of 7, Plt 101, lactate 2.3, AG 20, INR 1.3. CXR negative for infiltrate. CT A/P demonstrated extensive atherosclerosis and sigmoid diverticulosis, no other acute findings. COVID -, BCx pending, 2U PRBC was ordered with appropriate response. Protonix infusion, octreotide and DDAVP ordered.   PCCM was consulted for admission. GI was consulted with plan for EGD. On 7/11AM, patient was more confused and somnolent, likely secondary to increased BUN (due for dialysis and with GIB). Nephrology was consulted and patient was dialyzed with improvement in mentation. During HD, patient did  complain of mild chest pain for which and EKG was obtained; there was some concern for ST changes on EKG and code STEMI was called, but rescinded as Cardiology felt these changes were chronic. Troponins were trended and very slightly elevated to 40s (previously as high as 600, chronically elevated since 2020). EGD was postponed due to elevated troponin and patient expressed that he did not want to proceed with procedure, stating "I've had that procedure multiple times and they never find anything" and "I've been living with this already this long, I just want to go home". Patient appeared mentally clear to make his own medical decisions and Dr. Lynetta Mare with CCM discussed postponing EGD and further outpatient workup with GI attending Dr. Carlean Purl. Given patient's wishes to go home and clinical stability, patient was discharged home with plan for ambulatory outpatient GI follow up.   Prior to discharge, discussed resumption of all home medications with patient with recommendation for NSAID avoidance. Discussed return to home outpatient HD schedule beginning tomorrow, 7/13. Notified patient that an outpatient referral has been placed for gastroenterology and that he will be contacted to set up an appointment. Patient verbalized understanding.  Discharge Plan by Active Problems: Hemorrhagic shock in the setting of GIB - ?Upper vs lower, unclear etiology. +FOBT, Hgb 7 +H. Pylori (03/2021) History of GERD, GIB Presented 7/10AM with SOB, brown emesis - later re-presented to ED with hematemesis/melena. Patient unfortunately has had several past occurrences of GIB with multiple EGDs. CT A/P 7/10 demonstrated sigmoid diverticulosis, no other acute findings. Query sequelae of H. Pylori infection (treated),  PUD vs. diverticular bleed. S/p 2U PRBCs 7/11AM and DDAVP. EGD recommended, postponed due to patient wishes to go home and clinical stability. - GI consulted, appreciate recommendations - EGD initially planned for  today, 7/12 - delayed due to elevated troponin - Patient expressed wishes to go home and f/u as outpatient; discussed with Dr. Carlean Purl and this would be acceptable to postpone EGD at this time - Discussed benefit vs. potential risks of discharge home today and not proceeding with scope, including: ongoing bleeding, decreased blood counts/anemia, syncope/dizziness related to anemia, cardiac abnormalities, potential need for readmission - Continue PPI BID - Patient counseled re: NSAID avoidance, concerning symptoms for which to monitor - Ambulatory Referral to Gastroenterology placed   Acute Blood Loss Anemia Thrombocytopenia In the setting of GIB. Hgb 9.5 > 7.0, +FOBT. S/p 2U PRBCs since admission. - Monitor as outpatient with HD labs   Acute encephalopathy in the setting of hemorrhagic shock, ABLA, ESRD Drowsy/somnolent, likely multifactorial in the setting of the above. BUN increasing in the setting of GIB, HD need. - Resume outpatient HD per MWF schedule beginning tomorrow, 7/13 - F/u with outpatient Nephrologist - Trend BMP with HD labs   ESRD on hemodialysis Hyperkalemia Dialyzes at [facility] MWF via LUE AVF. EDW per last Nephrology note 75kg. Last session 7/8 prior to admission. - Nephrology consulted, appreciate assistance with HD - HD per usual outpatient schedule MWF - Counseled re: NSAID avoidance   Elevated troponin History of CHF History of CAD with MI, s/p CABG and stent placement History of HTN History of HLD Patient noted chest pain 7/11PM, prompting EKG. ST changes noted and Code STEMI called; Cardiology deemed old changes and nonacute. Troponins trended and mildly elevated (have been for several months to years). - Resume home ASA, Coreg - Continue home Lipitor  HX COPD - Continue home Ellipta   HX HIV On Biktarvy 50-200-25 - Continue home Biktarvy   DM2 - Continue blood glucose checks at home - Continue home insulin regimen  Significant Hospital  Tests/Studies: Please see Radiology reports below.  Consults: PCCM, Gastroenterology, Nephrology  Discharge Exam: BP (!) 131/53   Pulse 90   Temp 99.6 F (37.6 C) (Oral)   Resp 17   Wt 82.8 kg   SpO2 96%   BMI 23.44 kg/m   Physical Examination: General: Chronically ill-appearing elderly gentleman in NAD. HEENT: [place]/AT, anicteric sclera, PERRL, moist mucous membranes. Neuro: Awake, oriented x 4. Responds to verbal stimuli. Following commands consistently. Moves all 4 extremities spontaneously. Strength 5/5 in all 4 extremities. CV: RRR, no m/g/r. PULM: Breathing even and unlabored on RA. Lung fields CTAB. GI: Soft, nontender, nondistended. Normoactive bowel sounds. Extremities: No LE edema noted. Skin: Warm/dry, no rashes.  Labs at Discharge: Lab Results  Component Value Date   CREATININE 6.96 (H) 05/04/2021   BUN 115 (H) 05/04/2021   NA 136 05/04/2021   K 5.4 (H) 05/04/2021   CL 100 05/04/2021   CO2 25 05/04/2021   Lab Results  Component Value Date   WBC 5.6 05/04/2021   HGB 7.2 (L) 05/04/2021   HCT 21.6 (L) 05/04/2021   MCV 88.9 05/04/2021   PLT 79 (L) 05/04/2021   Lab Results  Component Value Date   ALT 15 05/03/2021   AST 25 05/03/2021   ALKPHOS 31 (L) 05/03/2021   BILITOT 0.7 05/03/2021   Lab Results  Component Value Date   INR 1.3 (H) 05/03/2021   INR 1.2 02/13/2021   INR 1.2 11/25/2020   Current  Radiology Studies: CT Abdomen Pelvis Wo Contrast  Result Date: 05/02/2021 CLINICAL DATA:  Epigastric and diffuse abdominal pain. Episode of vomiting this morning. Recently diagnosed with H. pylori infection. EXAM: CT ABDOMEN AND PELVIS WITHOUT CONTRAST TECHNIQUE: Multidetector CT imaging of the abdomen and pelvis was performed following the standard protocol without IV contrast. COMPARISON:  02/15/2021 FINDINGS: Lower chest: Stable enlarged heart. Dense coronary artery calcifications. Post CABG changes. Clear lung bases. Hepatobiliary: No focal liver  abnormality is seen. Status post cholecystectomy. No biliary dilatation. Small amount of interval biliary air. Pancreas: Unremarkable. No pancreatic ductal dilatation or surrounding inflammatory changes. Spleen: Normal in size without focal abnormality. Adrenals/Urinary Tract: Unremarkable adrenal glands. Dense bilateral renal vascular calcifications with no definite calculi seen. Small right renal cysts, including a small hemorrhagic or proteinaceous cyst. Unremarkable ureters and urinary bladder. Stomach/Bowel: Multiple sigmoid colon diverticula without evidence of diverticulitis. Normal appearing appendix. Unremarkable stomach and small bowel. Vascular/Lymphatic: Extensive dense atheromatous arterial calcifications, including severe calcifications throughout the proximal superior mesenteric artery and both proximal renal arteries. No aneurysm seen. No enlarged lymph nodes. Reproductive: Minimally enlarged prostate gland. Other: Moderate left and minimal right inguinal hernias containing fat. Musculoskeletal: Left hip fixation hardware. Old, healed bilateral rib fractures. Lumbar and lower thoracic spine degenerative changes. IMPRESSION: 1. No acute abnormality. 2. Extensive, severe calcified atherosclerotic changes, as described above. These include the coronary arteries, superior mesenteric artery and renal arteries. 3. Sigmoid colon diverticulosis. 4. Stable cardiomegaly. Electronically Signed   By: Claudie Revering M.D.   On: 05/02/2021 12:42   DG Chest Portable 1 View  Result Date: 05/03/2021 CLINICAL DATA:  Vomiting and blood in stool, initial encounter EXAM: PORTABLE CHEST 1 VIEW COMPARISON:  03/04/2021 FINDINGS: Cardiac shadow is enlarged. Postsurgical changes are again seen. Aortic calcifications are noted. The lungs are clear bilaterally. Chronic rib fractures on the right are noted. IMPRESSION: No acute abnormality noted. Electronically Signed   By: Inez Catalina M.D.   On: 05/03/2021 01:15     Disposition:  Discharge disposition: 01-Home or Self Care     Allergies as of 05/04/2021       Reactions   Augmentin [amoxicillin-pot Clavulanate] Diarrhea, Other (See Comments)   Severe diarrhea   Mucinex Fast-max Other (See Comments)   Intense sweating    Amphetamines Other (See Comments)   Unknown reaction        Medication List     STOP taking these medications    amoxicillin 500 MG tablet Commonly known as: AMOXIL   clarithromycin 500 MG tablet Commonly known as: BIAXIN   misoprostol 100 MCG tablet Commonly known as: CYTOTEC   sodium chloride irrigation 0.9 % irrigation       TAKE these medications    acetaminophen 650 MG CR tablet Commonly known as: TYLENOL Take 1,300 mg by mouth 3 (three) times daily.   allopurinol 100 MG tablet Commonly known as: ZYLOPRIM TAKE 1 TABLET(100 MG) BY MOUTH DAILY What changed:  how much to take how to take this when to take this additional instructions   Anoro Ellipta 62.5-25 MCG/INH Aepb Generic drug: umeclidinium-vilanterol INHALE 1 PUFF BY MOUTH EVERY DAY What changed: See the new instructions.   aspirin EC 81 MG tablet Take 81 mg by mouth daily. Swallow whole.   atorvastatin 10 MG tablet Commonly known as: LIPITOR Take 1 tablet (10 mg total) by mouth daily.   B-D UF III MINI PEN NEEDLES 31G X 5 MM Misc Generic drug: Insulin Pen Needle USE FOUR TIMES DAILY  Biktarvy 50-200-25 MG Tabs tablet Generic drug: bictegravir-emtricitabine-tenofovir AF Take 1 tablet by mouth daily.   Calcium Acetate 667 MG Tabs Take 667-1,334 mg by mouth See admin instructions. Take 1,334 mg by mouth three times a day with meals and 667 mg with each snack   carvedilol 3.125 MG tablet Commonly known as: COREG TAKE 1 TABLET(3.125 MG) BY MOUTH TWICE DAILY What changed: See the new instructions.   colchicine 0.6 MG tablet TAKE 1 TABLET BY MOUTH AS NEEDED FOR GOUT What changed: See the new instructions.   diclofenac  Sodium 1 % Gel Commonly known as: VOLTAREN APPLY 2 GRAMS TOPICALLY TO THE AFFECTED AREA THREE TIMES DAILY AS NEEDED FOR PAIN What changed:  how much to take how to take this when to take this reasons to take this additional instructions   folic acid 1 MG tablet Commonly known as: FOLVITE Take 1 tablet (1 mg total) by mouth daily.   gabapentin 300 MG capsule Commonly known as: NEURONTIN Take 1 capsule (300 mg total) by mouth daily. Dose change due to renal function   insulin lispro 100 UNIT/ML KwikPen Commonly known as: HUMALOG INJECT 20 UNITS UNDER THE SKIN THREE TIMES DAILY AS NEEDED FOR HIGH BLOOD SUGAR(ABOVE 150) What changed:  how much to take how to take this when to take this reasons to take this additional instructions   isosorbide mononitrate 30 MG 24 hr tablet Commonly known as: IMDUR TAKE 1 TABLET BY MOUTH EVERY DAY What changed:  how much to take how to take this when to take this additional instructions   latanoprost 0.005 % ophthalmic solution Commonly known as: XALATAN Place 1 drop into both eyes at bedtime.   lidocaine-prilocaine cream Commonly known as: EMLA Apply 1 application topically every Monday, Wednesday, and Friday with hemodialysis.   MIRCERA IJ Mircera   multivitamin Tabs tablet Take 1 tablet by mouth daily.   nitroGLYCERIN 0.3 MG SL tablet Commonly known as: NITROSTAT PLACE 1 TABLET UNDER THE TONGUE AS NEEDED FOR CHEST PAIN What changed: See the new instructions.   OneTouch Delica Plus [id number] Misc Inject 1 Device as directed 3 (three) times daily. Dx: E11.40   OneTouch Ultra test strip Generic drug: glucose blood Check blood sugar three times daily. Dx:E11.40   oxyCODONE-acetaminophen 5-325 MG tablet Commonly known as: PERCOCET/ROXICET Take 1 tablet by mouth daily as needed for severe pain.   pantoprazole 40 MG tablet Commonly known as: PROTONIX Take 1 tablet (40 mg total) by mouth 2 (two) times daily before a meal.    polyethylene glycol 17 g packet Commonly known as: MIRALAX / GLYCOLAX Take 17 g by mouth daily as needed for mild constipation.   timolol 0.5 % ophthalmic solution Commonly known as: TIMOPTIC Place 1 drop into both eyes in the morning.       Discharged Condition: fair  45 minutes of time have been dedicated to discharge assessment, planning and discharge instructions.   Lestine Mount, PA-C [facility] 05/04/21 10:17 AM  Please see [email or web address] for pager details.  From 7A-7P if no response, please call [phone] After hours, please call ELink [phone]

## 2021-05-04 NOTE — Progress Notes (Signed)
Endo just called me and told me that patient is unable to do endoscopy procedure until cleared by cardiology due to patient's elevated troponin. I told endo that patient was wanting to leave AMA anyway.  I let CCM [place] Crane, PA-C know at this time.   Montez Hageman RN

## 2021-05-04 NOTE — Care Management Obs Status (Signed)
[facility] NOTIFICATION   Patient Details  Name: Jacob [name] MRN: [id number] Date of Birth: 29-Jun-1951   Medicare Observation Status Notification Given:  Yes    Ella Bodo, RN 05/04/2021, 10:28 AM

## 2021-05-04 NOTE — Telephone Encounter (Signed)
Noted thank you

## 2021-05-04 NOTE — ED Provider Notes (Signed)
Emergency Medicine Provider Triage Evaluation Note  Jacob [name] , a 70 y.o. male  was evaluated in triage.  Pt complains of presents emerged part chief complaint of dark tarry stools, recently seen here last night, lab work was obtained, it was unremarkable, hemoglobin stable at 7.2, he has no other complaints at this time.  Review of Systems  Positive: Melena, shortness of breath Negative: Chest pain pedal edema  Physical Exam  BP (!) 109/53 (BP Location: Right Arm)   Pulse 89   Temp 97.9 F (36.6 C) (Oral)   Resp 18   SpO2 100%  Gen:   Awake, no distress   Resp:  Normal effort  MSK:   Moves extremities without difficulty  Other:    Medical Decision Making  Medically screening exam initiated at 5:49 PM.  Appropriate orders placed.  Jacob [name] was informed that the remainder of the evaluation will be completed by another provider, this initial triage assessment does not replace that evaluation, and the importance of remaining in the ED until their evaluation is complete.  Patient presents with dark tarry stools, patient will need further work-up here in the emergency department.   Taygen, Acklin, PA-C 05/04/21 Mayer, Atkins, DO 05/04/21 1936

## 2021-05-04 NOTE — Progress Notes (Signed)
IV's removed from patient. Patient given disposable scrub pants to wear home. Discharge paperwork gone over with patient. All questions answered. Patient states he has friend coming to pick him up from the hospital. Patient took cell phone, phone charger, pants, wallet, shoes, and socks with him. Patient refused last set of vitals. I will wheel him downstairs now.   Montez Hageman, RN

## 2021-05-04 NOTE — Progress Notes (Signed)
Provider: Clair Alfieri FNP-C  [name], NP  Patient Care Team: [name], NP as PCP - General (Geriatric Medicine) [name], MD as PCP - Infectious Diseases (Infectious Diseases) [name], MD as PCP - Cardiology (Cardiology) [name], MD as Consulting Physician (Ophthalmology) [name], MD as Consulting Physician (Cardiology) [name], MD as Consulting Physician (Infectious Diseases) Gardiner Barefoot, DPM as Consulting Physician (Podiatry) [facility] [place], Nelda Bucks, NP as Nurse Practitioner (Family Medicine) [place], [name]  Extended Emergency Contact Information Primary Emergency Contact: [place] Phone: [phone] Relation: Sister Secondary Emergency Contact: [facility] Phone: [phone] Relation: None  Code Status:Full Code  Goals of care: Advanced Directive information Advanced Directives 05/04/2021  Does Patient Have a Medical Advance Directive? No  Type of Advance Directive -  Does patient want to make changes to medical advance directive? -  Copy of [place] in Chart? -  Would patient like information on creating a medical advance directive? No - Patient declined  Pre-existing out of facility DNR order (yellow form or pink MOST form) -     Chief Complaint  Patient presents with   Acute Visit    Complains of black stool and weakness.     HPI:  Pt is a 70 y.o. male seen today for an acute visit for evaluation of black stool  and generalized weakness.states has been to the hospital for weakness.states hemoglobin was 7.2 today and was not given blood.He feels too weak to do anything.  He complains of generalized abdominal pain and feeling short of breath.  He denies any cough,nausea or vomiting,fever or chills.    Past Medical History:  Diagnosis Date   Acute respiratory failure ([place]) 03/01/2018   Anemia    Arthritis    "all over; mostly knees and back" (02/28/2018)   Chronic  combined systolic and diastolic CHF (congestive heart failure) (HCC)    Chronic lower back pain    stenosis   Community acquired pneumonia 09/06/2013   COPD (chronic obstructive pulmonary disease) ([place])    Coronary atherosclerosis of native coronary artery    a. 02/2003 s/p CABG x 2 (VG->RI, VG->RPDA; b. 11/2019 PCI: LM nl, LAD 90d, D3 50, RI 100, LCX 100p, OM3 100 - fills via L->L collats from D2/dLAD, RCA 100p, VG->RPDA ok, VG->RI 95 (3.5x48 Synergy XD DES).   Drug abuse ([place])    hx; tested for cocaine as recently as 2/08. says he is not using drugs now - avoided defib. for this reason    ESRD (end stage renal disease) ([place])    [place] M-W-F- [facility]   Fall at home 10/2020   GERD (gastroesophageal reflux disease)    takes OTC meds as needed   GI bleeding    a. 11/2019 EGD: angiodysplastic lesions w/ bleeding s/p argon plasma/clipping/epi inj. Multiple admissions for the same.   Glaucoma    uses eye drops daily   Hepatitis B 1968   "tx'd w/isolation; caught it from toilet stools in gym"   History of blood transfusion 03/01/2019   History of colon polyps    benign   History of gout    takes Allopurinol daily as well as Colchicine-if needed (02/28/2018)   History of kidney stones    HTN (hypertension)    takes Coreg,Imdur.and Apresoline daily   Human immunodeficiency virus (HIV) disease ([place]) dx'd 1995   on [place] as of 12/2020.     Hyperlipidemia    Ischemic cardiomyopathy    a.  01/2019 Echo: EF 40-45%, diffuse HK, mild basal septal hypertrophy. Diast dysfxn. Nl RV size/fxn. Sev dil LA. Triv MR/TR/PR.   Muscle spasm    takes Zanaflex as needed   Myocardial infarction ([place]) ~ 2004/2005   Nocturia    Peripheral neuropathy    takes gabapentin daily   Pneumonia    "at least twice" (02/28/2018)   SDH (subdural hematoma) (HCC)    Syphilis, unspecified    Type II diabetes mellitus ([place]) 2004   Lantus daily.Average fasting blood sugar 125-199   Wears glasses    Wears partial dentures     Past Surgical History:  Procedure Laterality Date   AV FISTULA PLACEMENT Left 08/02/2018   Procedure: ARTERIOVENOUS (AV) FISTULA CREATION  left arm radiocephlic;  Surgeon: Marty Heck, MD;  Location: [place];  Service: Vascular;  Laterality: Left;   AV FISTULA PLACEMENT Left 08/01/2019   Procedure: LEFT BRACHIOCEPHALIC ARTERIOVENOUS (AV) FISTULA CREATION;  Surgeon: [name], MD;  Location: [place];  Service: Vascular;  Laterality: Left;   [place] Left 10/03/2019   Procedure: BASILIC VEIN TRANSPOSITION LEFT SECOND STAGE;  Surgeon: [name], MD;  Location: [place];  Service: Vascular;  Laterality: Left;   BIOPSY  01/25/2021   Procedure: BIOPSY;  Surgeon: Doran Stabler, MD;  Location: [name] ENDOSCOPY;  Service: Gastroenterology;;   CARDIAC CATHETERIZATION  10/2002; 12/19/2004   [name] 03/08/2011   COLONOSCOPY  2013   [place]    CORONARY ARTERY BYPASS GRAFT  02/24/2003   CABG X2/notes 03/08/2011   CORONARY STENT INTERVENTION N/A 12/19/2019   Procedure: CORONARY STENT INTERVENTION;  Surgeon: Jettie Booze, MD;  Location: [place] CV LAB;  Service: Cardiovascular;  Laterality: N/A;   ENTEROSCOPY N/A 01/25/2021   Procedure: ENTEROSCOPY;  Surgeon: Doran Stabler, MD;  Location: [place];  Service: Gastroenterology;  Laterality: N/A;   ENTEROSCOPY N/A 02/13/2021   Procedure: ENTEROSCOPY;  Surgeon: Jackquline Denmark, MD;  Location: [facility] ENDOSCOPY;  Service: Endoscopy;  Laterality: N/A;   ESOPHAGOGASTRODUODENOSCOPY (EGD) WITH PROPOFOL N/A 02/08/2019   Procedure: ESOPHAGOGASTRODUODENOSCOPY (EGD) WITH PROPOFOL;  Surgeon: Milus Banister, MD;  Location: [place];  Service: Gastroenterology;  Laterality: N/A;   ESOPHAGOGASTRODUODENOSCOPY (EGD) WITH PROPOFOL N/A 12/22/2019   Procedure: ESOPHAGOGASTRODUODENOSCOPY (EGD) WITH PROPOFOL;  Surgeon: Lavena Bullion, DO;  Location: [place];  Service: Gastroenterology;  Laterality: N/A;    ESOPHAGOGASTRODUODENOSCOPY (EGD) WITH PROPOFOL N/A 10/19/2020   Procedure: ESOPHAGOGASTRODUODENOSCOPY (EGD) WITH PROPOFOL;  Surgeon: Jackquline Denmark, MD;  Location: [facility] ENDOSCOPY;  Service: Endoscopy;  Laterality: N/A;   ESOPHAGOGASTRODUODENOSCOPY (EGD) WITH PROPOFOL N/A 12/22/2020   Procedure: ESOPHAGOGASTRODUODENOSCOPY (EGD) WITH PROPOFOL;  Surgeon: Gatha Mayer, MD;  Location: [place];  Service: Endoscopy;  Laterality: N/A;   ESOPHAGOGASTRODUODENOSCOPY (EGD) WITH PROPOFOL N/A 01/09/2021   Procedure: ESOPHAGOGASTRODUODENOSCOPY (EGD) WITH PROPOFOL;  Surgeon: Irene Shipper, MD;  Location: [facility] ENDOSCOPY;  Service: Endoscopy;  Laterality: N/A;   HEMOSTASIS CLIP PLACEMENT  12/22/2019   Procedure: HEMOSTASIS CLIP PLACEMENT;  Surgeon: Lavena Bullion, DO;  Location: [place] ENDOSCOPY;  Service: Gastroenterology;;   HEMOSTASIS CLIP PLACEMENT  12/22/2020   Procedure: HEMOSTASIS CLIP PLACEMENT;  Surgeon: Gatha Mayer, MD;  Location: [facility] ENDOSCOPY;  Service: Endoscopy;;   HEMOSTASIS CONTROL  12/22/2020   Procedure: HEMOSTASIS CONTROL/hemospray;  Surgeon: Gatha Mayer, MD;  Location: [place];  Service: Endoscopy;;   HOT HEMOSTASIS N/A 02/08/2019   Procedure: HOT HEMOSTASIS (ARGON PLASMA COAGULATION/BICAP);  Surgeon: Milus Banister, MD;  Location:  [place] [facility]OSCOPY;  Service: Gastroenterology;  Laterality: N/A;   HOT HEMOSTASIS N/A 12/22/2019   Procedure: HOT HEMOSTASIS (ARGON PLASMA COAGULATION/BICAP);  Surgeon: Lavena Bullion, DO;  Location: [facility] ENDOSCOPY;  Service: Gastroenterology;  Laterality: N/A;   HOT HEMOSTASIS N/A 10/19/2020   Procedure: HOT HEMOSTASIS (ARGON PLASMA COAGULATION/BICAP);  Surgeon: Jackquline Denmark, MD;  Location: [facility] ENDOSCOPY;  Service: Endoscopy;  Laterality: N/A;   HOT HEMOSTASIS N/A 12/22/2020   Procedure: HOT HEMOSTASIS (ARGON PLASMA COAGULATION/BICAP);  Surgeon: Gatha Mayer, MD;  Location: [facility] ENDOSCOPY;  Service: Endoscopy;  Laterality: N/A;   HOT HEMOSTASIS N/A 01/09/2021    Procedure: HOT HEMOSTASIS (ARGON PLASMA COAGULATION/BICAP);  Surgeon: Irene Shipper, MD;  Location: [facility] ENDOSCOPY;  Service: Endoscopy;  Laterality: N/A;   HOT HEMOSTASIS N/A 01/25/2021   Procedure: HOT HEMOSTASIS (ARGON PLASMA COAGULATION/BICAP);  Surgeon: Doran Stabler, MD;  Location: [place];  Service: Gastroenterology;  Laterality: N/A;   HOT HEMOSTASIS N/A 02/13/2021   Procedure: HOT HEMOSTASIS (ARGON PLASMA COAGULATION/BICAP);  Surgeon: Jackquline Denmark, MD;  Location: [facility] ENDOSCOPY;  Service: Endoscopy;  Laterality: N/A;   INTERTROCHANTERIC HIP FRACTURE SURGERY Left 11/2006   [name] 03/08/2011   INTRAVASCULAR ULTRASOUND/IVUS N/A 12/19/2019   Procedure: Intravascular Ultrasound/IVUS;  Surgeon: Jettie Booze, MD;  Location: [place] CV LAB;  Service: Cardiovascular;  Laterality: N/A;   IR FLUORO GUIDE CV LINE RIGHT  07/24/2019   IR FLUORO GUIDE CV LINE RIGHT  07/30/2019   IR US GUIDE VASC ACCESS RIGHT  07/24/2019   IR US GUIDE VASC ACCESS RIGHT  07/30/2019   LAPAROSCOPIC CHOLECYSTECTOMY  05/2006   LIGATION OF COMPETING BRANCHES OF ARTERIOVENOUS FISTULA Left 11/05/2018   Procedure: LIGATION OF COMPETING BRANCHES OF ARTERIOVENOUS FISTULA  LEFT  ARM;  Surgeon: Marty Heck, MD;  Location[facility] OR;  Service: Vascular;  Laterality: Left;   LUMBAR LAMINECTOMY/DECOMPRESSION MICRODISCECTOMY N/A 02/29/2016   Procedure: Left L4-5 Lateral Recess Decompression, Removal Extradural Intraspinal Facet Cyst;  Surgeon: Marybelle Killings, MD;  Location: [place];  Service: Orthopedics;  Laterality: N/A;   MULTIPLE TOOTH EXTRACTIONS     ORIF MANDIBULAR FRACTURE Left 08/13/2004   ORIF of left body fracture mandible with KLS Martin 2.3-mm six hole/notes 03/08/2011   RIGHT/LEFT HEART CATH AND CORONARY/GRAFT ANGIOGRAPHY N/A 12/19/2019   Procedure: RIGHT/LEFT HEART CATH AND CORONARY/GRAFT ANGIOGRAPHY;  Surgeon: Jettie Booze, MD;  Location: [place] CV LAB;  Service: Cardiovascular;  Laterality: N/A;    SCLEROTHERAPY  12/22/2019   Procedure: SCLEROTHERAPY;  Surgeon: Lavena Bullion, DO;  Location: [place] ENDOSCOPY;  Service: Gastroenterology;;   Clide Deutscher  02/13/2021   Procedure: Clide Deutscher;  Surgeon: Jackquline Denmark, MD;  Location: [facility] ENDOSCOPY;  Service: Endoscopy;;    Allergies  Allergen Reactions   Augmentin [Amoxicillin-Pot Clavulanate] Diarrhea and Other (See Comments)    Severe diarrhea   Mucinex Fast-Max Other (See Comments)    Intense sweating    Amphetamines Other (See Comments)    Unknown reaction    Outpatient Encounter Medications as of 05/04/2021  Medication Sig   acetaminophen (TYLENOL) 650 MG CR tablet Take 1,300 mg by mouth 3 (three) times daily.   allopurinol (ZYLOPRIM) 100 MG tablet TAKE 1 TABLET(100 MG) BY MOUTH DAILY   ANORO ELLIPTA 62.5-25 MCG/INH AEPB INHALE 1 PUFF BY MOUTH EVERY DAY   aspirin EC 81 MG tablet Take 81 mg by mouth daily. Swallow whole.   atorvastatin (LIPITOR) 10 MG tablet Take 1 tablet (10 mg total) by mouth daily.   B-D UF  III MINI PEN NEEDLES 31G X 5 MM MISC USE FOUR TIMES DAILY   bictegravir-emtricitabine-tenofovir AF (BIKTARVY) 50-200-25 MG TABS tablet Take 1 tablet by mouth daily.   Calcium Acetate 667 MG TABS Take 667-1,334 mg by mouth See admin instructions. Take 1,334 mg by mouth three times a day with meals and 667 mg with each snack   carvedilol (COREG) 3.125 MG tablet TAKE 1 TABLET(3.125 MG) BY MOUTH TWICE DAILY   colchicine 0.6 MG tablet TAKE 1 TABLET BY MOUTH AS NEEDED FOR GOUT   diclofenac Sodium (VOLTAREN) 1 % GEL APPLY 2 GRAMS TOPICALLY TO THE AFFECTED AREA THREE TIMES DAILY AS NEEDED FOR PAIN   folic acid (FOLVITE) 1 MG tablet Take 1 tablet (1 mg total) by mouth daily.   gabapentin (NEURONTIN) 300 MG capsule Take 1 capsule (300 mg total) by mouth daily. Dose change due to renal function   glucose blood (ONETOUCH ULTRA) test strip Check blood sugar three times daily. Dx:E11.40   insulin lispro (HUMALOG) 100 UNIT/ML KwikPen  INJECT 20 UNITS UNDER THE SKIN THREE TIMES DAILY AS NEEDED FOR HIGH BLOOD SUGAR(ABOVE 150)   isosorbide mononitrate (IMDUR) 30 MG 24 hr tablet TAKE 1 TABLET BY MOUTH EVERY DAY   Lancets (ONETOUCH DELICA PLUS [id number]) MISC Inject 1 Device as directed 3 (three) times daily. Dx: E11.40   latanoprost (XALATAN) 0.005 % ophthalmic solution Place 1 drop into both eyes at bedtime.   lidocaine-prilocaine (EMLA) cream Apply 1 application topically every Monday, Wednesday, and Friday with hemodialysis.   Methoxy PEG-Epoetin Beta (MIRCERA IJ) Mircera   multivitamin (RENA-VIT) TABS tablet Take 1 tablet by mouth daily.   nitroGLYCERIN (NITROSTAT) 0.3 MG SL tablet PLACE 1 TABLET UNDER THE TONGUE AS NEEDED FOR CHEST PAIN   oxyCODONE-acetaminophen (PERCOCET/ROXICET) 5-325 MG tablet Take 1 tablet by mouth daily as needed for severe pain.   pantoprazole (PROTONIX) 40 MG tablet Take 1 tablet (40 mg total) by mouth 2 (two) times daily before a meal.   polyethylene glycol (MIRALAX / GLYCOLAX) 17 g packet Take 17 g by mouth daily as needed for mild constipation.   timolol (TIMOPTIC) 0.5 % ophthalmic solution Place 1 drop into both eyes in the morning.   [DISCONTINUED] 0.9 %  sodium chloride infusion    [DISCONTINUED] 0.9 %  sodium chloride infusion    [DISCONTINUED] alteplase (CATHFLO ACTIVASE) injection 2 mg    [DISCONTINUED] atorvastatin (LIPITOR) tablet 10 mg    [DISCONTINUED] bictegravir-emtricitabine-tenofovir AF (BIKTARVY) 50-200-25 MG per tablet 1 tablet    [DISCONTINUED] Chlorhexidine Gluconate Cloth 2 % PADS 6 each    [DISCONTINUED] Chlorhexidine Gluconate Cloth 2 % PADS 6 each    [DISCONTINUED] docusate sodium (COLACE) capsule 100 mg    [DISCONTINUED] heparin injection 1,000 Units    [DISCONTINUED] insulin aspart (novoLOG) injection 0-15 Units    [DISCONTINUED] latanoprost (XALATAN) 0.005 % ophthalmic solution 1 drop    [DISCONTINUED] lidocaine (PF) (XYLOCAINE) 1 % injection 5 mL    [DISCONTINUED]  lidocaine-prilocaine (EMLA) cream 1 application    [DISCONTINUED] octreotide (SANDOSTATIN) 500 mcg in sodium chloride 0.9 % 250 mL (2 mcg/mL) infusion    [DISCONTINUED] ondansetron (ZOFRAN) injection 4 mg    [DISCONTINUED] pantoprazole (PROTONIX) injection 40 mg    [DISCONTINUED] pantoprozole (PROTONIX) 80 mg /NS 100 mL infusion    [DISCONTINUED] pentafluoroprop-tetrafluoroeth (GEBAUERS) aerosol 1 application    [DISCONTINUED] polyethylene glycol (MIRALAX / GLYCOLAX) packet 17 g    [DISCONTINUED] timolol (TIMOPTIC) 0.5 % ophthalmic solution 1 drop    [DISCONTINUED] umeclidinium-vilanterol (  ANORO ELLIPTA) 62.5-25 MCG/INH 1 puff    No facility-administered encounter medications on file as of 05/04/2021.    Review of Systems  Constitutional:  Positive for fatigue. Negative for appetite change, chills, fever and unexpected weight change.  HENT:  Negative for congestion, dental problem, ear discharge, ear pain, facial swelling, hearing loss, nosebleeds, postnasal drip, rhinorrhea, sinus pressure, sinus pain, sneezing, sore throat, tinnitus and trouble swallowing.   Eyes:  Negative for pain, discharge, redness, itching and visual disturbance.  Respiratory:  Negative for cough, chest tightness and wheezing.        Reports short of breath   Cardiovascular:  Negative for chest pain, palpitations and leg swelling.  Gastrointestinal:  Positive for abdominal pain and blood in stool. Negative for abdominal distention, constipation, diarrhea, nausea and vomiting.  Musculoskeletal:  Positive for gait problem. Negative for arthralgias, back pain, joint swelling, myalgias, neck pain and neck stiffness.  Skin:  Negative for color change, pallor, rash and wound.  Neurological:  Negative for dizziness, syncope, speech difficulty, light-headedness, numbness and headaches.       Generalized weakness    Immunization History  Administered Date(s) Administered   Fluad Quad(high Dose 65+) 06/14/2019   Hepatitis B  08/28/2006, 10/02/2007, 04/01/2008   Hepatitis B, adult 06/03/2014, 07/04/2014   Influenza Split 07/28/2011   Influenza Whole 08/28/2006, 09/10/2007, 09/15/2008, 08/03/2009, 07/26/2010   Influenza, High Dose Seasonal PF 07/04/2018   Influenza,inj,Quad PF,6+ Mos 07/04/2014, 07/06/2015, 07/12/2016, 07/11/2017   Influenza-Unspecified 06/24/2013, 06/30/2020   PFIZER(Purple Top)SARS-COV-2 Vaccination 11/16/2019, 12/07/2019, 07/23/2020   Pneumococcal Conjugate-13 08/18/2016   Pneumococcal Polysaccharide-23 08/28/2006, 07/28/2011, 05/30/2018   Tdap 06/14/2019   Zoster Recombinat (Shingrix) 07/24/2017, 01/09/2018   Zoster, Live 06/03/2014   Pertinent  Health Maintenance Due  Topic Date Due   OPHTHALMOLOGY EXAM  10/07/2020   FOOT EXAM  12/30/2020   INFLUENZA VACCINE  05/24/2021   HEMOGLOBIN A1C  08/03/2021   LIPID PANEL  12/15/2021   COLONOSCOPY (Pts 45-27yrs Insurance coverage will need to be confirmed)  10/01/2022   PNA vac Low Risk Adult  Completed   Fall Risk  05/04/2021 01/12/2021 12/15/2020 12/14/2020 11/03/2020  Falls in the past year? 0 0 0 1 1  Comment - - - - -  Number falls in past yr: 0 0 0 1 1  Comment - - - - Fall 11/02/20  Injury with Fall? 0 0 0 0 0  Comment - - - - -  Risk Factor Category  - - - - -  Risk for fall due to : No Fall Risks - - History of fall(s) History of fall(s);Impaired balance/gait;Impaired mobility;Impaired vision;Medication side effect;Orthopedic patient  Risk for fall due to: Comment - - - - -  Follow up Falls evaluation completed - - - Falls prevention discussed;Education provided;Falls evaluation completed   Functional Status Survey:    Vitals:   05/04/21 1438  BP: 130/70  Pulse: (!) 113  Resp: 18  Temp: 97.7 F (36.5 C)  SpO2: (!) 85%  Weight: 175 lb 6.4 oz (79.6 kg)  Height: 6\' 2"  (1.88 m)   Body mass index is 22.52 kg/m. Physical Exam Vitals reviewed.  Constitutional:      General: He is not in acute distress.    Appearance: Normal  appearance. He is normal weight. He is not ill-appearing or diaphoretic.  HENT:     Head: Normocephalic.     Nose: Nose normal. No congestion or rhinorrhea.     Mouth/Throat:     Mouth: Mucous  membranes are moist.     Pharynx: Oropharynx is clear. No oropharyngeal exudate or posterior oropharyngeal erythema.  Eyes:     General: No scleral icterus.       Right eye: No discharge.        Left eye: No discharge.     Extraocular Movements: Extraocular movements intact.     Conjunctiva/sclera: Conjunctivae normal.     Pupils: Pupils are equal, round, and reactive to light.  Neck:     Vascular: No carotid bruit.  Cardiovascular:     Rate and Rhythm: Normal rate and regular rhythm.     Pulses: Normal pulses.     Heart sounds: Normal heart sounds. No murmur heard.   No friction rub. No gallop.     Comments: Left Arm dialysis access dressing dry clean and intact  Pulmonary:     Effort: Pulmonary effort is normal. No respiratory distress.     Breath sounds: Normal breath sounds. No wheezing, rhonchi or rales.  Chest:     Chest wall: No tenderness.  Abdominal:     General: Bowel sounds are normal. There is no distension.     Palpations: Abdomen is soft. There is no mass.     Tenderness: There is no abdominal tenderness. There is no right CVA tenderness, left CVA tenderness, guarding or rebound.  Musculoskeletal:        General: No swelling or tenderness. Normal range of motion.     Cervical back: Normal range of motion. No rigidity or tenderness.     Right lower leg: No edema.     Left lower leg: No edema.     Comments: Unsteady gait walks with a cane   Lymphadenopathy:     Cervical: No cervical adenopathy.  Skin:    General: Skin is warm and dry.     Coloration: Skin is not pale.     Findings: No bruising, erythema, lesion or rash.  Neurological:     Mental Status: He is alert and oriented to person, place, and time.     Cranial Nerves: No cranial nerve deficit.     Sensory: No  sensory deficit.     Motor: No weakness.     Coordination: Coordination normal.     Gait: Gait abnormal.  Psychiatric:        Mood and Affect: Mood normal.        Speech: Speech normal.        Behavior: Behavior normal.        Thought Content: Thought content normal.        Judgment: Judgment normal.    Labs reviewed: Recent Labs    01/26/21 0602 02/12/21 0855 02/14/21 0613 02/15/21 0651 05/03/21 0046 05/03/21 0101 05/03/21 2135 05/04/21 0734  NA 137   < > 137   < > 138 138 136 136  K 3.7   < > 4.9   < > 5.2* 4.8 4.4 5.4*  CL 97*   < > 101   < > 97* 99 98 100  CO2 26   < > 25   < > 21*  --  27 25  GLUCOSE 117*   < > 139*   < > 170* 169* 147* 117*  BUN 37*   < > 117*   < > 157* >130* 96* 115*  CREATININE 5.90*   < > 7.07*   < > 8.80* 9.40* 5.98* 6.96*  CALCIUM 9.3   < > 8.8*   < > 8.1*  --  8.0* 7.8*  MG  --   --  1.7  --   --   --  2.1 2.1  PHOS 6.9*  --   --   --   --   --  4.2 5.3*   < > = values in this interval not displayed.   Recent Labs    02/12/21 0855 02/25/21 1317 05/02/21 0743 05/03/21 0046  AST 16 13 22 25   ALT 12 10 15 15   ALKPHOS 64  --  44 31*  BILITOT 0.5 0.4 0.6 0.7  PROT 6.3* 7.2 6.9 6.0*  ALBUMIN 2.9*  --  3.1* 2.8*   Recent Labs    02/14/21 0613 02/14/21 1836 02/25/21 1317 05/02/21 0743 05/03/21 0046 05/03/21 0101 05/03/21 2135 05/04/21 0734  WBC 8.3   < > 6.6   < > 6.5  --  5.5 5.6  NEUTROABS 5.8  --  3,967  --  4.7  --   --   --   HGB 8.0*   < > 10.4*   < > 7.0* 8.5* 7.2* 7.2*  HCT 23.9*   < > 33.1*   < > 22.7* 25.0* 22.2* 21.6*  MCV 90.2   < > 93.2   < > 92.3  --  89.2 88.9  PLT 79*   < > 198   < > 101*  --  80* 79*   < > = values in this interval not displayed.   Lab Results  Component Value Date   TSH 0.49 12/15/2020   Lab Results  Component Value Date   HGBA1C 5.8 (H) 05/03/2021   Lab Results  Component Value Date   CHOL 143 12/15/2020   HDL 31 (L) 12/15/2020   LDLCALC 79 12/15/2020   TRIG 248 (H) 12/15/2020    CHOLHDL 4.6 12/15/2020    Significant Diagnostic Results in last 30 days:  Epidural Steroid injection  Result Date: 04/15/2021 Magnus Sinning, MD     04/18/2021  3:11 PM Lumbar Epidural Steroid Injection - Interlaminar Approach with Fluoroscopic Guidance Patient: Jacob [name]     Date of Birth: [birth year] MRN: [id number] PCP: [name], NP     Visit Date: 04/15/2021  Universal Protocol:   Consent Given By: the patient Position: PRONE Additional Comments: Vital signs were monitored before and after the procedure. Patient was prepped and draped in the usual sterile fashion. The correct patient, procedure, and site was verified. Injection Procedure Details: Procedure diagnoses: Lumbar radiculopathy [M54.16] Meds Administered: Meds ordered this encounter Medications  betamethasone acetate-betamethasone sodium phosphate (CELESTONE) injection 12 mg  Laterality: Right Location/Site:  L5-S1 Needle: 3.5 in., 20 ga. Tuohy Needle Placement: Paramedian epidural Findings:  -Comments: Excellent flow of contrast into the epidural space. Procedure Details: Using a paramedian approach from the side mentioned above, the region overlying the inferior lamina was localized under fluoroscopic visualization and the soft tissues overlying this structure were infiltrated with 4 ml. of 1% Lidocaine without Epinephrine. The Tuohy needle was inserted into the epidural space using a paramedian approach. The epidural space was localized using loss of resistance along with counter oblique bi-planar fluoroscopic views.  After negative aspirate for air, blood, and CSF, a 2 ml. volume of Isovue-250 was injected into the epidural space and the flow of contrast was observed. Radiographs were obtained for documentation purposes.  The injectate was administered into the level noted above. Additional Comments: No complications occurred Dressing: 2 x 2 sterile gauze and Band-Aid  Post-procedure details: Patient was observed during  the  procedure. Post-procedure instructions were reviewed. Patient left the clinic in stable condition.   CT Abdomen Pelvis Wo Contrast  Result Date: 05/02/2021 CLINICAL DATA:  Epigastric and diffuse abdominal pain. Episode of vomiting this morning. Recently diagnosed with H. pylori infection. EXAM: CT ABDOMEN AND PELVIS WITHOUT CONTRAST TECHNIQUE: Multidetector CT imaging of the abdomen and pelvis was performed following the standard protocol without IV contrast. COMPARISON:  02/15/2021 FINDINGS: Lower chest: Stable enlarged heart. Dense coronary artery calcifications. Post CABG changes. Clear lung bases. Hepatobiliary: No focal liver abnormality is seen. Status post cholecystectomy. No biliary dilatation. Small amount of interval biliary air. Pancreas: Unremarkable. No pancreatic ductal dilatation or surrounding inflammatory changes. Spleen: Normal in size without focal abnormality. Adrenals/Urinary Tract: Unremarkable adrenal glands. Dense bilateral renal vascular calcifications with no definite calculi seen. Small right renal cysts, including a small hemorrhagic or proteinaceous cyst. Unremarkable ureters and urinary bladder. Stomach/Bowel: Multiple sigmoid colon diverticula without evidence of diverticulitis. Normal appearing appendix. Unremarkable stomach and small bowel. Vascular/Lymphatic: Extensive dense atheromatous arterial calcifications, including severe calcifications throughout the proximal superior mesenteric artery and both proximal renal arteries. No aneurysm seen. No enlarged lymph nodes. Reproductive: Minimally enlarged prostate gland. Other: Moderate left and minimal right inguinal hernias containing fat. Musculoskeletal: Left hip fixation hardware. Old, healed bilateral rib fractures. Lumbar and lower thoracic spine degenerative changes. IMPRESSION: 1. No acute abnormality. 2. Extensive, severe calcified atherosclerotic changes, as described above. These include the coronary arteries, superior  mesenteric artery and renal arteries. 3. Sigmoid colon diverticulosis. 4. Stable cardiomegaly. Electronically Signed   By: Claudie Revering M.D.   On: 05/02/2021 12:42   DG Chest Portable 1 View  Result Date: 05/03/2021 CLINICAL DATA:  Vomiting and blood in stool, initial encounter EXAM: PORTABLE CHEST 1 VIEW COMPARISON:  03/04/2021 FINDINGS: Cardiac shadow is enlarged. Postsurgical changes are again seen. Aortic calcifications are noted. The lungs are clear bilaterally. Chronic rib fractures on the right are noted. IMPRESSION: No acute abnormality noted. Electronically Signed   By: Inez Catalina M.D.   On: 05/03/2021 01:15   XR C-ARM NO REPORT  Result Date: 04/15/2021 Please see Notes tab for imaging impression.   Assessment/Plan 1. Dark stools Decline rectal exam to guaiac stool.  Has had dark stool with abdominal pain  Hgb 7.2 today   2. Generalized weakness Reports weak unable to do anything.suspect due to anemia.  3. Generalized abdominal pain Worst with bowel movement   Family/ staff Communication: Reviewed plan of care with patient verbalized understanding.  Labs/tests ordered: None   Next Appointment: send to ED via EMS for further evaluation.left in stable condition at 3: 22 pm   Time spent with patient 250 minutes >50% time spent counseling; reviewing medical record; tests; labs; and developing future plan of care.send to ED for further evaluation of dark stool,generalized weakness and shortness of breath.     [name], NP

## 2021-05-04 NOTE — Progress Notes (Addendum)
When I went to introduce myself and do bedside rounding with patient and night shift RN, patient demanded that he "speak to the doctor now because I want to go home and rest". I reminded patient that he has an endoscopy procedure today. Patient then stated "I have had that procedure done many times and it doesn't do anything. I want to go home". I will let CCM Dr. Lynetta Mare know when he rounds on the unit.  Montez Hageman RN

## 2021-05-04 NOTE — ED Triage Notes (Signed)
Pt states he was discharged today. Endorses dark stools since last night. Last HD today. Labs drawn at 730 this morning.

## 2021-05-04 NOTE — ED Triage Notes (Signed)
Patient arrived from home complaining of ongoing dark tarry stools and was discharged from hospital yesterday. Patient reports same was happening while in hospital. Hgb 7.2. denies pain, alert and oriented

## 2021-05-04 NOTE — Care Management CC44 (Signed)
Condition Code 44 Documentation Completed  Patient Details  Name: Jacob [name] MRN: [id number] Date of Birth: Jun 11, 1951   Condition Code 44 given:  Yes Patient signature on Condition Code 44 notice:  Yes Documentation of 2 MD's agreement:  Yes Code 44 added to claim:  Yes    Ella Bodo, RN 05/04/2021, 10:28 AM

## 2021-05-04 NOTE — Progress Notes (Signed)
Dr. Lynetta Mare spoke with patient and plan is discharge. Both R arm IV's removed at this time. Patient now ordering breakfast and will get dressed after he eats. Will discharge patient after he eats and finds a ride. VSS.   Montez Hageman RN

## 2021-05-05 ENCOUNTER — Emergency Department (HOSPITAL_COMMUNITY): Payer: Medicare Other

## 2021-05-05 ENCOUNTER — Ambulatory Visit: Payer: Self-pay | Admitting: Family

## 2021-05-05 DIAGNOSIS — N281 Cyst of kidney, acquired: Secondary | ICD-10-CM | POA: Diagnosis not present

## 2021-05-05 DIAGNOSIS — R531 Weakness: Secondary | ICD-10-CM | POA: Diagnosis not present

## 2021-05-05 DIAGNOSIS — K920 Hematemesis: Secondary | ICD-10-CM | POA: Diagnosis present

## 2021-05-05 DIAGNOSIS — I5040 Unspecified combined systolic (congestive) and diastolic (congestive) heart failure: Secondary | ICD-10-CM | POA: Diagnosis not present

## 2021-05-05 DIAGNOSIS — Q2730 Arteriovenous malformation, site unspecified: Secondary | ICD-10-CM

## 2021-05-05 DIAGNOSIS — E1142 Type 2 diabetes mellitus with diabetic polyneuropathy: Secondary | ICD-10-CM | POA: Diagnosis not present

## 2021-05-05 DIAGNOSIS — Z794 Long term (current) use of insulin: Secondary | ICD-10-CM | POA: Diagnosis not present

## 2021-05-05 DIAGNOSIS — I252 Old myocardial infarction: Secondary | ICD-10-CM | POA: Diagnosis not present

## 2021-05-05 DIAGNOSIS — N186 End stage renal disease: Secondary | ICD-10-CM | POA: Diagnosis not present

## 2021-05-05 DIAGNOSIS — I2510 Atherosclerotic heart disease of native coronary artery without angina pectoris: Secondary | ICD-10-CM | POA: Diagnosis not present

## 2021-05-05 DIAGNOSIS — D689 Coagulation defect, unspecified: Secondary | ICD-10-CM | POA: Diagnosis not present

## 2021-05-05 DIAGNOSIS — K921 Melena: Secondary | ICD-10-CM | POA: Diagnosis not present

## 2021-05-05 DIAGNOSIS — Z955 Presence of coronary angioplasty implant and graft: Secondary | ICD-10-CM | POA: Diagnosis not present

## 2021-05-05 DIAGNOSIS — D649 Anemia, unspecified: Secondary | ICD-10-CM | POA: Diagnosis not present

## 2021-05-05 DIAGNOSIS — I10 Essential (primary) hypertension: Secondary | ICD-10-CM | POA: Diagnosis not present

## 2021-05-05 DIAGNOSIS — Z951 Presence of aortocoronary bypass graft: Secondary | ICD-10-CM | POA: Diagnosis not present

## 2021-05-05 DIAGNOSIS — E875 Hyperkalemia: Secondary | ICD-10-CM | POA: Diagnosis present

## 2021-05-05 DIAGNOSIS — D6959 Other secondary thrombocytopenia: Secondary | ICD-10-CM | POA: Diagnosis not present

## 2021-05-05 DIAGNOSIS — K3189 Other diseases of stomach and duodenum: Secondary | ICD-10-CM | POA: Diagnosis not present

## 2021-05-05 DIAGNOSIS — K922 Gastrointestinal hemorrhage, unspecified: Secondary | ICD-10-CM | POA: Diagnosis not present

## 2021-05-05 DIAGNOSIS — E1122 Type 2 diabetes mellitus with diabetic chronic kidney disease: Secondary | ICD-10-CM | POA: Diagnosis present

## 2021-05-05 DIAGNOSIS — Z9049 Acquired absence of other specified parts of digestive tract: Secondary | ICD-10-CM | POA: Diagnosis not present

## 2021-05-05 DIAGNOSIS — E1129 Type 2 diabetes mellitus with other diabetic kidney complication: Secondary | ICD-10-CM | POA: Diagnosis not present

## 2021-05-05 DIAGNOSIS — Z992 Dependence on renal dialysis: Secondary | ICD-10-CM

## 2021-05-05 DIAGNOSIS — D631 Anemia in chronic kidney disease: Secondary | ICD-10-CM | POA: Diagnosis not present

## 2021-05-05 DIAGNOSIS — Z888 Allergy status to other drugs, medicaments and biological substances status: Secondary | ICD-10-CM | POA: Diagnosis not present

## 2021-05-05 DIAGNOSIS — B20 Human immunodeficiency virus [HIV] disease: Secondary | ICD-10-CM | POA: Diagnosis present

## 2021-05-05 DIAGNOSIS — D62 Acute posthemorrhagic anemia: Secondary | ICD-10-CM | POA: Diagnosis not present

## 2021-05-05 DIAGNOSIS — K31819 Angiodysplasia of stomach and duodenum without bleeding: Secondary | ICD-10-CM | POA: Diagnosis not present

## 2021-05-05 DIAGNOSIS — N2581 Secondary hyperparathyroidism of renal origin: Secondary | ICD-10-CM | POA: Diagnosis not present

## 2021-05-05 DIAGNOSIS — E8809 Other disorders of plasma-protein metabolism, not elsewhere classified: Secondary | ICD-10-CM | POA: Diagnosis not present

## 2021-05-05 DIAGNOSIS — J449 Chronic obstructive pulmonary disease, unspecified: Secondary | ICD-10-CM | POA: Diagnosis present

## 2021-05-05 DIAGNOSIS — I5042 Chronic combined systolic (congestive) and diastolic (congestive) heart failure: Secondary | ICD-10-CM | POA: Diagnosis present

## 2021-05-05 DIAGNOSIS — I132 Hypertensive heart and chronic kidney disease with heart failure and with stage 5 chronic kidney disease, or end stage renal disease: Secondary | ICD-10-CM | POA: Diagnosis not present

## 2021-05-05 DIAGNOSIS — Z20822 Contact with and (suspected) exposure to covid-19: Secondary | ICD-10-CM | POA: Diagnosis not present

## 2021-05-05 DIAGNOSIS — G9349 Other encephalopathy: Secondary | ICD-10-CM | POA: Diagnosis not present

## 2021-05-05 DIAGNOSIS — K31811 Angiodysplasia of stomach and duodenum with bleeding: Secondary | ICD-10-CM | POA: Diagnosis not present

## 2021-05-05 DIAGNOSIS — R578 Other shock: Secondary | ICD-10-CM | POA: Diagnosis not present

## 2021-05-05 DIAGNOSIS — K449 Diaphragmatic hernia without obstruction or gangrene: Secondary | ICD-10-CM | POA: Diagnosis not present

## 2021-05-05 DIAGNOSIS — Z880 Allergy status to penicillin: Secondary | ICD-10-CM | POA: Diagnosis not present

## 2021-05-05 LAB — CBC
HCT: 21.4 % — ABNORMAL LOW (ref 39.0–52.0)
Hemoglobin: 6.7 g/dL — CL (ref 13.0–17.0)
MCH: 29.6 pg (ref 26.0–34.0)
MCHC: 31.3 g/dL (ref 30.0–36.0)
MCV: 94.7 fL (ref 80.0–100.0)
Platelets: 84 10*3/uL — ABNORMAL LOW (ref 150–400)
RBC: 2.26 MIL/uL — ABNORMAL LOW (ref 4.22–5.81)
RDW: 18 % — ABNORMAL HIGH (ref 11.5–15.5)
WBC: 5 10*3/uL (ref 4.0–10.5)
nRBC: 0.6 % — ABNORMAL HIGH (ref 0.0–0.2)

## 2021-05-05 LAB — TYPE AND SCREEN
ABO/RH(D): A POS
Antibody Screen: NEGATIVE
Unit division: 0
Unit division: 0

## 2021-05-05 LAB — CBG MONITORING, ED
Glucose-Capillary: 114 mg/dL — ABNORMAL HIGH (ref 70–99)
Glucose-Capillary: 123 mg/dL — ABNORMAL HIGH (ref 70–99)
Glucose-Capillary: 130 mg/dL — ABNORMAL HIGH (ref 70–99)

## 2021-05-05 LAB — BPAM RBC
Blood Product Expiration Date: 202207262359
Blood Product Expiration Date: 202207272359
ISSUE DATE / TIME: 202207110217
ISSUE DATE / TIME: 202207110443
Unit Type and Rh: 6200
Unit Type and Rh: 6200

## 2021-05-05 LAB — BASIC METABOLIC PANEL
Anion gap: 14 (ref 5–15)
BUN: 141 mg/dL — ABNORMAL HIGH (ref 8–23)
CO2: 22 mmol/L (ref 22–32)
Calcium: 7.9 mg/dL — ABNORMAL LOW (ref 8.9–10.3)
Chloride: 101 mmol/L (ref 98–111)
Creatinine, Ser: 8.5 mg/dL — ABNORMAL HIGH (ref 0.61–1.24)
GFR, Estimated: 6 mL/min — ABNORMAL LOW (ref >60)
Glucose, Bld: 109 mg/dL — ABNORMAL HIGH (ref 70–99)
Potassium: 5.4 mmol/L — ABNORMAL HIGH (ref 3.5–5.1)
Sodium: 137 mmol/L (ref 135–145)

## 2021-05-05 LAB — PREPARE RBC (CROSSMATCH)

## 2021-05-05 LAB — HEMOGLOBIN AND HEMATOCRIT, BLOOD
HCT: 24.5 % — ABNORMAL LOW (ref 39.0–52.0)
HCT: 25 % — ABNORMAL LOW (ref 39.0–52.0)
HCT: 25.5 % — ABNORMAL LOW (ref 39.0–52.0)
Hemoglobin: 7.9 g/dL — ABNORMAL LOW (ref 13.0–17.0)
Hemoglobin: 8.3 g/dL — ABNORMAL LOW (ref 13.0–17.0)
Hemoglobin: 8.1 g/dL — ABNORMAL LOW (ref 13.0–17.0)

## 2021-05-05 LAB — GLUCOSE, CAPILLARY: Glucose-Capillary: 139 mg/dL — ABNORMAL HIGH (ref 70–99)

## 2021-05-05 LAB — HEPATITIS B SURFACE ANTIGEN: Hepatitis B Surface Ag: NONREACTIVE

## 2021-05-05 LAB — PROTIME-INR
INR: 1 (ref 0.8–1.2)
Prothrombin Time: 13.3 seconds (ref 11.4–15.2)

## 2021-05-05 LAB — AMMONIA: Ammonia: 49 umol/L — ABNORMAL HIGH (ref 9–35)

## 2021-05-05 LAB — SARS CORONAVIRUS 2 (TAT 6-24 HRS): SARS Coronavirus 2: NEGATIVE

## 2021-05-05 MED ORDER — UMECLIDINIUM-VILANTEROL 62.5-25 MCG/INH IN AEPB
1.0000 | INHALATION_SPRAY | Freq: Every day | RESPIRATORY_TRACT | Status: DC
  Administered 2021-05-07 – 2021-05-08 (×2): 1 via RESPIRATORY_TRACT
  Filled 2021-05-05: qty 14

## 2021-05-05 MED ORDER — BICTEGRAVIR-EMTRICITAB-TENOFOV 50-200-25 MG PO TABS
1.0000 | ORAL_TABLET | Freq: Every day | ORAL | Status: DC
  Administered 2021-05-05 – 2021-05-08 (×4): 1 via ORAL
  Filled 2021-05-05 (×4): qty 1

## 2021-05-05 MED ORDER — FOLIC ACID 1 MG PO TABS
1.0000 mg | ORAL_TABLET | Freq: Every day | ORAL | Status: DC
  Administered 2021-05-05 – 2021-05-08 (×4): 1 mg via ORAL
  Filled 2021-05-05 (×4): qty 1

## 2021-05-05 MED ORDER — SODIUM CHLORIDE 0.9 % IV SOLN: 100.0000 mL | INTRAVENOUS | Status: DC | PRN

## 2021-05-05 MED ORDER — LATANOPROST 0.005 % OP SOLN
1.0000 [drp] | Freq: Every day | OPHTHALMIC | Status: DC
  Administered 2021-05-06 – 2021-05-07 (×3): 1 [drp] via OPHTHALMIC
  Filled 2021-05-05: qty 2.5

## 2021-05-05 MED ORDER — DARBEPOETIN ALFA 150 MCG/0.3ML IJ SOSY
150.0000 ug | PREFILLED_SYRINGE | INTRAMUSCULAR | Status: DC
  Administered 2021-05-05: 150 ug via INTRAVENOUS

## 2021-05-05 MED ORDER — ACETAMINOPHEN 325 MG PO TABS
650.0000 mg | ORAL_TABLET | Freq: Four times a day (QID) | ORAL | Status: DC | PRN
  Administered 2021-05-06: 650 mg via ORAL
  Filled 2021-05-05: qty 2

## 2021-05-05 MED ORDER — THIAMINE HCL 100 MG/ML IJ SOLN
100.0000 mg | Freq: Every day | INTRAMUSCULAR | Status: DC
  Administered 2021-05-05 – 2021-05-08 (×4): 100 mg via INTRAVENOUS
  Filled 2021-05-05 (×4): qty 2

## 2021-05-05 MED ORDER — CHLORHEXIDINE GLUCONATE CLOTH 2 % EX PADS: 6.0000 | MEDICATED_PAD | Freq: Every day | CUTANEOUS | Status: DC

## 2021-05-05 MED ORDER — LIDOCAINE-PRILOCAINE 2.5-2.5 % EX CREA
1.0000 "application " | TOPICAL_CREAM | CUTANEOUS | Status: DC | PRN
  Filled 2021-05-05: qty 5

## 2021-05-05 MED ORDER — SODIUM CHLORIDE 0.9% IV SOLUTION: Freq: Once | INTRAVENOUS | Status: AC

## 2021-05-05 MED ORDER — ATORVASTATIN CALCIUM 10 MG PO TABS
10.0000 mg | ORAL_TABLET | Freq: Every day | ORAL | Status: DC
  Administered 2021-05-05 – 2021-05-08 (×4): 10 mg via ORAL
  Filled 2021-05-05 (×4): qty 1

## 2021-05-05 MED ORDER — RENA-VITE PO TABS
1.0000 | ORAL_TABLET | Freq: Every day | ORAL | Status: DC
  Administered 2021-05-05 – 2021-05-08 (×4): 1 via ORAL
  Filled 2021-05-05 (×4): qty 1

## 2021-05-05 MED ORDER — HEPARIN SODIUM (PORCINE) 1000 UNIT/ML DIALYSIS
1000.0000 [IU] | INTRAMUSCULAR | Status: DC | PRN
  Filled 2021-05-05: qty 1

## 2021-05-05 MED ORDER — LIDOCAINE HCL (PF) 1 % IJ SOLN: 5.0000 mL | INTRAMUSCULAR | Status: DC | PRN

## 2021-05-05 MED ORDER — ONDANSETRON HCL 4 MG/2ML IJ SOLN: 4.0000 mg | Freq: Four times a day (QID) | INTRAMUSCULAR | Status: DC | PRN

## 2021-05-05 MED ORDER — PANTOPRAZOLE SODIUM 40 MG IV SOLR
40.0000 mg | Freq: Two times a day (BID) | INTRAVENOUS | Status: DC
  Administered 2021-05-05 – 2021-05-07 (×5): 40 mg via INTRAVENOUS
  Filled 2021-05-05 (×5): qty 40

## 2021-05-05 MED ORDER — INSULIN ASPART 100 UNIT/ML IJ SOLN
0.0000 [IU] | INTRAMUSCULAR | Status: DC
  Administered 2021-05-05 (×2): 1 [IU] via SUBCUTANEOUS
  Administered 2021-05-07: 2 [IU] via SUBCUTANEOUS

## 2021-05-05 MED ORDER — ONDANSETRON HCL 4 MG PO TABS: 4.0000 mg | ORAL_TABLET | Freq: Four times a day (QID) | ORAL | Status: DC | PRN

## 2021-05-05 MED ORDER — PENTAFLUOROPROP-TETRAFLUOROETH EX AERO
1.0000 "application " | INHALATION_SPRAY | CUTANEOUS | Status: DC | PRN
  Filled 2021-05-05: qty 116

## 2021-05-05 MED ORDER — CALCIUM ACETATE (PHOS BINDER) 667 MG PO CAPS
1334.0000 mg | ORAL_CAPSULE | Freq: Three times a day (TID) | ORAL | Status: DC
  Administered 2021-05-05 – 2021-05-08 (×5): 1334 mg via ORAL
  Filled 2021-05-05 (×6): qty 2

## 2021-05-05 MED ORDER — IOHEXOL 350 MG/ML SOLN
100.0000 mL | Freq: Once | INTRAVENOUS | Status: AC | PRN
  Administered 2021-05-05: 100 mL via INTRAVENOUS

## 2021-05-05 MED ORDER — TIMOLOL MALEATE 0.5 % OP SOLN
1.0000 [drp] | Freq: Every morning | OPHTHALMIC | Status: DC
  Administered 2021-05-06: 1 [drp] via OPHTHALMIC
  Filled 2021-05-05: qty 5

## 2021-05-05 MED ORDER — ACETAMINOPHEN 650 MG RE SUPP: 650.0000 mg | Freq: Four times a day (QID) | RECTAL | Status: DC | PRN

## 2021-05-05 MED ORDER — CALCIUM ACETATE (PHOS BINDER) 667 MG PO CAPS: 667.0000 mg | ORAL_CAPSULE | ORAL | Status: DC | PRN

## 2021-05-05 MED ORDER — DARBEPOETIN ALFA 150 MCG/0.3ML IJ SOSY
PREFILLED_SYRINGE | INTRAMUSCULAR | Status: AC
  Filled 2021-05-05: qty 0.3

## 2021-05-05 MED ORDER — DIPHENHYDRAMINE HCL 25 MG PO CAPS
25.0000 mg | ORAL_CAPSULE | Freq: Every evening | ORAL | Status: DC | PRN
  Administered 2021-05-06: 25 mg via ORAL
  Filled 2021-05-05: qty 1

## 2021-05-05 MED ORDER — GABAPENTIN 300 MG PO CAPS
300.0000 mg | ORAL_CAPSULE | Freq: Every day | ORAL | Status: DC
  Administered 2021-05-05 – 2021-05-08 (×4): 300 mg via ORAL
  Filled 2021-05-05 (×4): qty 1

## 2021-05-05 MED ORDER — PANTOPRAZOLE SODIUM 40 MG PO TBEC: 40.0000 mg | DELAYED_RELEASE_TABLET | Freq: Two times a day (BID) | ORAL | Status: DC

## 2021-05-05 NOTE — ED Provider Notes (Signed)
[facility] EMERGENCY DEPARTMENT Provider Note   CSN: [id number] Arrival date & time: 05/04/21  1552     History Chief Complaint  Patient presents with   Blood In Stools    Jacob [name] is a 70 y.o. male.  HPI     Past Medical History:  Diagnosis Date   Acute respiratory failure ([place]) 03/01/2018   Anemia    Arthritis    "all over; mostly knees and back" (02/28/2018)   Chronic combined systolic and diastolic CHF (congestive heart failure) (HCC)    Chronic lower back pain    stenosis   Community acquired pneumonia 09/06/2013   COPD (chronic obstructive pulmonary disease) ([place])    Coronary atherosclerosis of native coronary artery    a. 02/2003 s/p CABG x 2 (VG->RI, VG->RPDA; b. 11/2019 PCI: LM nl, LAD 90d, D3 50, RI 100, LCX 100p, OM3 100 - fills via L->L collats from D2/dLAD, RCA 100p, VG->RPDA ok, VG->RI 95 (3.5x48 Synergy XD DES).   Drug abuse ([place])    hx; tested for cocaine as recently as 2/08. says he is not using drugs now - avoided defib. for this reason    ESRD (end stage renal disease) ([place])    Hemo M-W-F- Richarda Blade   Fall at home 10/2020   GERD (gastroesophageal reflux disease)    takes OTC meds as needed   GI bleeding    a. 11/2019 EGD: angiodysplastic lesions w/ bleeding s/p argon plasma/clipping/epi inj. Multiple admissions for the same.   Glaucoma    uses eye drops daily   Hepatitis B 1968   "tx'd w/isolation; caught it from toilet stools in gym"   History of blood transfusion 03/01/2019   History of colon polyps    benign   History of gout    takes Allopurinol daily as well as Colchicine-if needed (02/28/2018)   History of kidney stones    HTN (hypertension)    takes Coreg,Imdur.and Apresoline daily   Human immunodeficiency virus (HIV) disease ([place]) dx'd 1995   on [place] as of 12/2020.     Hyperlipidemia    Ischemic cardiomyopathy    a. 01/2019 Echo: EF 40-45%, diffuse HK, mild basal septal hypertrophy. Diast dysfxn. Nl RV size/fxn.  Sev dil LA. Triv MR/TR/PR.   Muscle spasm    takes Zanaflex as needed   Myocardial infarction ([place]) ~ 2004/2005   Nocturia    Peripheral neuropathy    takes gabapentin daily   Pneumonia    "at least twice" (02/28/2018)   SDH (subdural hematoma) (HCC)    Syphilis, unspecified    Type II diabetes mellitus ([place]) 2004   Lantus daily.Average fasting blood sugar 125-199   Wears glasses    Wears partial dentures     Patient Active Problem List   Diagnosis Date Noted   Thrombocytopenia ([place])    Helicobacter pylori (H. pylori) infection 04/06/2021   Lumbar foraminal stenosis 03/30/2021   Gastric hemorrhage due to angiodysplasia of stomach    GI bleed 11/24/2020   COVID-19 virus infection 11/24/2020   Other mechanical complication of cranial or spinal infusion catheter, sequela 11/11/2020   Subdural hematoma ([place]) 11/06/2020   Fall at home, initial encounter 11/05/2020   Nicotine dependence, cigarettes, uncomplicated 37/62/8315   Alcohol abuse 11/05/2020   Unspecified trochanteric fracture of left femur, initial encounter for closed fracture ([place]) 11/05/2020   Traumatic subdural hematoma, initial encounter ([place]) 11/05/2020   Fracture of metatarsal of right foot, closed 11/05/2020   Nondisplaced fracture of  greater trochanter of left femur, initial encounter for closed fracture ([place])    Uremia    Angiodysplasia of intestine with hemorrhage    Pruritus, unspecified 10/02/2020   Iron deficiency anemia    Acute blood loss anemia    Acute on chronic anemia    Abnormal nuclear stress test 12/19/2019   Type 2 diabetes mellitus with diabetic neuropathy, unspecified ([place]) 08/07/2019   Allergy, unspecified, initial encounter 97/98/9211   Complication of vascular dialysis catheter 08/02/2019   Drug abuse counseling and surveillance of drug abuser 08/02/2019   Other specified coagulation defects ([place]) 08/02/2019   Secondary hyperparathyroidism of renal origin ([place]) 08/02/2019   Unspecified  atrial fibrillation ([place]) 08/02/2019   ESRD on hemodialysis (HCC)    Cocaine abuse ([place])    CHF exacerbation ([place]) 07/21/2019   Diabetic foot ulcer ([place]) 02/11/2019   AVM (arteriovenous malformation)    Melena 02/07/2019   Symptomatic anemia 02/06/2019   Hyperlipidemia associated with type 2 diabetes mellitus ([place]) 05/30/2018   Right foot ulcer ([place]) 02/28/2018   Chronic obstructive pulmonary disease ([place]) 02/28/2018   Anemia of chronic disease 11/20/2016   CHF (congestive heart failure) ([place]) 11/10/2016   History of lumbar laminectomy for spinal cord decompression 02/29/2016   Type 2 diabetes mellitus with diabetic polyneuropathy, with long-term current use of insulin ([place]) 06/17/2015   HTN (hypertension) 04/26/2015   DM (diabetes mellitus) ([place]) 04/26/2015   Ischemic cardiomyopathy 05/12/2014   Shock ([place]) 09/06/2013   Bunion of left foot 11/25/2011   Bunion, right foot 11/25/2011   Polysubstance abuse ([place]) 07/28/2011   Hip fracture, left ([place]) 04/04/2011   Closed fracture of neck of femur ([place]) 04/04/2011   Insomnia 02/18/2011   Coronary artery disease involving native heart without angina pectoris 04/20/2009   Chronic combined systolic (congestive) and diastolic (congestive) heart failure ([place]) 04/20/2009   Mixed hyperlipidemia 11/20/2006   Gout 11/20/2006   TOBACCO ABUSE 11/20/2006   Essential hypertension 11/20/2006   Human immunodeficiency virus (HIV) disease ([place]) 09/02/2006    Past Surgical History:  Procedure Laterality Date   AV FISTULA PLACEMENT Left 08/02/2018   Procedure: ARTERIOVENOUS (AV) FISTULA CREATION  left arm radiocephlic;  Surgeon: Marty Heck, MD;  Location: [place];  Service: Vascular;  Laterality: Left;   AV FISTULA PLACEMENT Left 08/01/2019   Procedure: LEFT BRACHIOCEPHALIC ARTERIOVENOUS (AV) FISTULA CREATION;  Surgeon: [name], MD;  Location: [place];  Service: Vascular;  Laterality: Left;   [place] Left 10/03/2019    Procedure: BASILIC VEIN TRANSPOSITION LEFT SECOND STAGE;  Surgeon: [name], MD;  Location: [place];  Service: Vascular;  Laterality: Left;   BIOPSY  01/25/2021   Procedure: BIOPSY;  Surgeon: Doran Stabler, MD;  Location: [facility] ENDOSCOPY;  Service: Gastroenterology;;   CARDIAC CATHETERIZATION  10/2002; 12/19/2004   [name] 03/08/2011   COLONOSCOPY  2013   [place]    CORONARY ARTERY BYPASS GRAFT  02/24/2003   CABG X2/notes 03/08/2011   CORONARY STENT INTERVENTION N/A 12/19/2019   Procedure: CORONARY STENT INTERVENTION;  Surgeon: Jettie Booze, MD;  Location: [place] CV LAB;  Service: Cardiovascular;  Laterality: N/A;   ENTEROSCOPY N/A 01/25/2021   Procedure: ENTEROSCOPY;  Surgeon: Doran Stabler, MD;  Location: [place];  Service: Gastroenterology;  Laterality: N/A;   ENTEROSCOPY N/A 02/13/2021   Procedure: ENTEROSCOPY;  Surgeon: Jackquline Denmark, MD;  Location[facility]COPY;  Service: Endoscopy;  Laterality: N/A;   ESOPHAGOGASTRODUODENOSCOPY (EGD) WITH PROPOFOL N/A 02/08/2019  Procedure: ESOPHAGOGASTRODUODENOSCOPY (EGD) WITH PROPOFOL;  Surgeon: Milus Banister, MD;  Location: [facility] ENDOSCOPY;  Service: Gastroenterology;  Laterality: N/A;   ESOPHAGOGASTRODUODENOSCOPY (EGD) WITH PROPOFOL N/A 12/22/2019   Procedure: ESOPHAGOGASTRODUODENOSCOPY (EGD) WITH PROPOFOL;  Surgeon: Lavena Bullion, DO;  Location: [place];  Service: Gastroenterology;  Laterality: N/A;   ESOPHAGOGASTRODUODENOSCOPY (EGD) WITH PROPOFOL N/A 10/19/2020   Procedure: ESOPHAGOGASTRODUODENOSCOPY (EGD) WITH PROPOFOL;  Surgeon: Jackquline Denmark, MD;  Location[facility]COPY;  Service: Endoscopy;  Laterality: N/A;   ESOPHAGOGASTRODUODENOSCOPY (EGD) WITH PROPOFOL N/A 12/22/2020   Procedure: ESOPHAGOGASTRODUODENOSCOPY (EGD) WITH PROPOFOL;  Surgeon: Gatha Mayer, MD;  Location: [place];  Service: Endoscopy;  Laterality: N/A;   ESOPHAGOGASTRODUODENOSCOPY (EGD) WITH PROPOFOL N/A 01/09/2021   Procedure:  ESOPHAGOGASTRODUODENOSCOPY (EGD) WITH PROPOFOL;  Surgeon: Irene Shipper, MD;  Location: [facility] ENDOSCOPY;  Service: Endoscopy;  Laterality: N/A;   HEMOSTASIS CLIP PLACEMENT  12/22/2019   Procedure: HEMOSTASIS CLIP PLACEMENT;  Surgeon: Lavena Bullion, DO;  Location: [place] ENDOSCOPY;  Service: Gastroenterology;;   HEMOSTASIS CLIP PLACEMENT  12/22/2020   Procedure: HEMOSTASIS CLIP PLACEMENT;  Surgeon: Gatha Mayer, MD;  Location: [facility] ENDOSCOPY;  Service: Endoscopy;;   HEMOSTASIS CONTROL  12/22/2020   Procedure: HEMOSTASIS CONTROL/hemospray;  Surgeon: Gatha Mayer, MD;  Location: [place];  Service: Endoscopy;;   HOT HEMOSTASIS N/A 02/08/2019   Procedure: HOT HEMOSTASIS (ARGON PLASMA COAGULATION/BICAP);  Surgeon: Milus Banister, MD;  Location: [facility]OSCOPY;  Service: Gastroenterology;  Laterality: N/A;   HOT HEMOSTASIS N/A 12/22/2019   Procedure: HOT HEMOSTASIS (ARGON PLASMA COAGULATION/BICAP);  Surgeon: Lavena Bullion, DO;  Location: [facility] ENDOSCOPY;  Service: Gastroenterology;  Laterality: N/A;   HOT HEMOSTASIS N/A 10/19/2020   Procedure: HOT HEMOSTASIS (ARGON PLASMA COAGULATION/BICAP);  Surgeon: Jackquline Denmark, MD;  Location: [facility] ENDOSCOPY;  Service: Endoscopy;  Laterality: N/A;   HOT HEMOSTASIS N/A 12/22/2020   Procedure: HOT HEMOSTASIS (ARGON PLASMA COAGULATION/BICAP);  Surgeon: Gatha Mayer, MD;  Location: [facility] ENDOSCOPY;  Service: Endoscopy;  Laterality: N/A;   HOT HEMOSTASIS N/A 01/09/2021   Procedure: HOT HEMOSTASIS (ARGON PLASMA COAGULATION/BICAP);  Surgeon: Irene Shipper, MD;  Location: [facility] ENDOSCOPY;  Service: Endoscopy;  Laterality: N/A;   HOT HEMOSTASIS N/A 01/25/2021   Procedure: HOT HEMOSTASIS (ARGON PLASMA COAGULATION/BICAP);  Surgeon: Doran Stabler, MD;  Location: [place];  Service: Gastroenterology;  Laterality: N/A;   HOT HEMOSTASIS N/A 02/13/2021   Procedure: HOT HEMOSTASIS (ARGON PLASMA COAGULATION/BICAP);  Surgeon: Jackquline Denmark, MD;  Location: [facility] ENDOSCOPY;  Service:  Endoscopy;  Laterality: N/A;   INTERTROCHANTERIC HIP FRACTURE SURGERY Left 11/2006   [name] 03/08/2011   INTRAVASCULAR ULTRASOUND/IVUS N/A 12/19/2019   Procedure: Intravascular Ultrasound/IVUS;  Surgeon: Jettie Booze, MD;  Location: [place] CV LAB;  Service: Cardiovascular;  Laterality: N/A;   IR FLUORO GUIDE CV LINE RIGHT  07/24/2019   IR FLUORO GUIDE CV LINE RIGHT  07/30/2019   IR US GUIDE VASC ACCESS RIGHT  07/24/2019   IR US GUIDE VASC ACCESS RIGHT  07/30/2019   LAPAROSCOPIC CHOLECYSTECTOMY  05/2006   LIGATION OF COMPETING BRANCHES OF ARTERIOVENOUS FISTULA Left 11/05/2018   Procedure: LIGATION OF COMPETING BRANCHES OF ARTERIOVENOUS FISTULA  LEFT  ARM;  Surgeon: Marty Heck, MD;  [facility];  Service: Vascular;  Laterality: Left;   LUMBAR LAMINECTOMY/DECOMPRESSION MICRODISCECTOMY N/A 02/29/2016   Procedure: Left L4-5 Lateral Recess Decompression, Removal Extradural Intraspinal Facet Cyst;  Surgeon: Marybelle Killings, MD;  Location: [place];  Service: Orthopedics;  Laterality: N/A;   MULTIPLE TOOTH EXTRACTIONS  ORIF MANDIBULAR FRACTURE Left 08/13/2004   ORIF of left body fracture mandible with KLS Martin 2.3-mm six hole/notes 03/08/2011   RIGHT/LEFT HEART CATH AND CORONARY/GRAFT ANGIOGRAPHY N/A 12/19/2019   Procedure: RIGHT/LEFT HEART CATH AND CORONARY/GRAFT ANGIOGRAPHY;  Surgeon: Jettie Booze, MD;  Location: [place] CV LAB;  Service: Cardiovascular;  Laterality: N/A;   SCLEROTHERAPY  12/22/2019   Procedure: SCLEROTHERAPY;  Surgeon: Lavena Bullion, DO;  Location: [facility] ENDOSCOPY;  Service: Gastroenterology;;   Clide Deutscher  02/13/2021   Procedure: Clide Deutscher;  Surgeon: Jackquline Denmark, MD;  Location: [facility] ENDOSCOPY;  Service: Endoscopy;;       Family History  Problem Relation Age of Onset   Heart failure Father    Hypertension Father    Diabetes Brother    Heart attack Brother    Alzheimer's disease Mother    Stroke Sister    Diabetes Sister    Alzheimer's  disease Sister    Hypertension Brother    Diabetes Brother    Drug abuse Brother    Colon cancer Neg Hx     Social History   Tobacco Use   Smoking status: Every Day    Packs/day: 0.50    Years: 43.00    Pack years: 21.50    Types: Cigarettes   Smokeless tobacco: Never  Vaping Use   Vaping Use: Never used  Substance Use Topics   Alcohol use: Not Currently    Alcohol/week: 12.0 standard drinks    Types: 12 Standard drinks or equivalent per week    Comment: occassional   Drug use: Not Currently    Types: Cocaine    Comment: hx of crack/cocaine 75yrs ago 10/01/2019- none    Home Medications Prior to Admission medications   Medication Sig Start Date End Date Taking? Authorizing Provider  acetaminophen (TYLENOL) 650 MG CR tablet Take 1,300 mg by mouth 3 (three) times daily.    [provider]  allopurinol (ZYLOPRIM) 100 MG tablet TAKE 1 TABLET(100 MG) BY MOUTH DAILY 01/12/21   [name], Nelda Bucks, NP  ANORO ELLIPTA 62.5-25 MCG/INH AEPB INHALE 1 PUFF BY MOUTH EVERY DAY 12/21/20   [name], NP  aspirin EC 81 MG tablet Take 81 mg by mouth daily. Swallow whole.    [provider]  atorvastatin (LIPITOR) 10 MG tablet Take 1 tablet (10 mg total) by mouth daily. 03/02/21 05/31/21  Dunn, Nedra Hai, PA-C  B-D UF III MINI PEN NEEDLES 31G X 5 MM MISC USE FOUR TIMES DAILY 03/02/20   [name], NP  bictegravir-emtricitabine-tenofovir AF (BIKTARVY) 50-200-25 MG TABS tablet Take 1 tablet by mouth daily. 06/30/20   [name], MD  Calcium Acetate 667 MG TABS Take [phone] mg by mouth See admin instructions. Take 1,334 mg by mouth three times a day with meals and 667 mg with each snack 01/20/21   [provider]  carvedilol (COREG) 3.125 MG tablet TAKE 1 TABLET(3.125 MG) BY MOUTH TWICE DAILY 04/14/21   [name], MD  colchicine 0.6 MG tablet TAKE 1 TABLET BY MOUTH AS NEEDED FOR GOUT 04/14/21   [name], Dinah C, NP  diclofenac Sodium (VOLTAREN) 1 % GEL APPLY 2  GRAMS TOPICALLY TO THE AFFECTED AREA THREE TIMES DAILY AS NEEDED FOR PAIN 04/14/21   [name], NP  folic acid (FOLVITE) 1 MG tablet Take 1 tablet (1 mg total) by mouth daily. 01/12/21   [name], Dinah C, NP  gabapentin (NEURONTIN) 300 MG capsule Take 1 capsule (300 mg total) by mouth  daily. Dose change due to renal function 12/09/20   [name], NP  glucose blood (ONETOUCH ULTRA) test strip Check blood sugar three times daily. Dx:E11.40 03/02/21   [name], NP  insulin lispro (HUMALOG) 100 UNIT/ML KwikPen INJECT 20 UNITS UNDER THE SKIN THREE TIMES DAILY AS NEEDED FOR HIGH BLOOD SUGAR(ABOVE 150) 02/01/21   [name], NP  isosorbide mononitrate (IMDUR) 30 MG 24 hr tablet TAKE 1 TABLET BY MOUTH EVERY DAY 01/12/21   [name], Dinah C, NP  Lancets (ONETOUCH DELICA PLUS [id number]) MISC Inject 1 Device as directed 3 (three) times daily. Dx: E11.40 09/18/19   [name], NP  latanoprost (XALATAN) 0.005 % ophthalmic solution Place 1 drop into both eyes at bedtime.    [provider]  lidocaine-prilocaine (EMLA) cream Apply 1 application topically every Monday, Wednesday, and Friday with hemodialysis.    [provider]  Methoxy PEG-Epoetin Beta (MIRCERA IJ) Mircera 12/25/20   [provider]  multivitamin (RENA-VIT) TABS tablet Take 1 tablet by mouth daily.    [provider]  nitroGLYCERIN (NITROSTAT) 0.3 MG SL tablet PLACE 1 TABLET UNDER THE TONGUE AS NEEDED FOR CHEST PAIN 04/14/21   [name], MD  oxyCODONE-acetaminophen (PERCOCET/ROXICET) 5-325 MG tablet Take 1 tablet by mouth daily as needed for severe pain. 04/28/21   Magnant, Charles L, PA-C  pantoprazole (PROTONIX) 40 MG tablet Take 1 tablet (40 mg total) by mouth 2 (two) times daily before a meal. 02/16/21   Thurnell Lose, MD  polyethylene glycol (MIRALAX / GLYCOLAX) 17 g packet Take 17 g by mouth daily as needed for mild constipation. 11/07/20   Aline August, MD  timolol  (TIMOPTIC) 0.5 % ophthalmic solution Place 1 drop into both eyes in the morning. 04/04/19   [provider]    Allergies    Augmentin [amoxicillin-pot clavulanate], Mucinex fast-max, and Amphetamines  Review of Systems   Review of Systems  Physical Exam Updated Vital Signs BP 116/61   Pulse 87   Temp 98.2 F (36.8 C) (Oral)   Resp 10   SpO2 100%   Physical Exam  ED Results / Procedures / Treatments   Labs (all labs ordered are listed, but only abnormal results are displayed) Labs Reviewed  CBC - Abnormal; Notable for the following components:      Result Value   RBC 2.26 (*)    Hemoglobin 6.7 (*)    HCT 21.4 (*)    RDW 18.0 (*)    Platelets 84 (*)    nRBC 0.6 (*)    All other components within normal limits  BASIC METABOLIC PANEL - Abnormal; Notable for the following components:   Potassium 5.4 (*)    Glucose, Bld 109 (*)    BUN 141 (*)    Creatinine, Ser 8.50 (*)    Calcium 7.9 (*)    GFR, Estimated 6 (*)    All other components within normal limits  TYPE AND SCREEN    EKG None  Radiology No results found.  Procedures Procedures   Medications Ordered in ED Medications - No data to display  ED Course  I have reviewed the triage vital signs and the nursing notes.  Pertinent labs & imaging results that were available during my care of the patient were reviewed by me and considered in my medical decision making (see chart for details).    MDM Rules/Calculators/A&P  Patient presents with anemia.  Left AMA yesterday for same thing.  Did follow-up with PCP.  Hemoglobin is dropped more.  However vitals overall are maintained.  Likely require transfusion but also is a dialysis patient and unsure what day was dialyzed last.  Was previously in ICU but I think that he does not need ICU level care at this time.  Will discuss with hospitalist. Reviewing notes it appears that there was no immediate plans for GI  intervention.  CRITICAL CARE Performed by: [name] Total critical care time: 30 minutes Critical care time was exclusive of separately billable procedures and treating other patients. Critical care was necessary to treat or prevent imminent or life-threatening deterioration. Critical care was time spent personally by me on the following activities: development of treatment plan with patient and/or surrogate as well as nursing, discussions with consultants, evaluation of patient's response to treatment, examination of patient, obtaining history from patient or surrogate, ordering and performing treatments and interventions, ordering and review of laboratory studies, ordering and review of radiographic studies, pulse oximetry and re-evaluation of patient's condition.  Final Clinical Impression(s) / ED Diagnoses Final diagnoses:  Gastrointestinal hemorrhage, unspecified gastrointestinal hemorrhage type  Symptomatic anemia    Rx / DC Orders ED Discharge Orders     None        [name], MD 05/05/21 [phone]

## 2021-05-05 NOTE — Progress Notes (Addendum)
[name] Progress Note   Subjective:   Patient discharged home yesterday morning.  Returned last night and left again coming back this AM due to ongoing melena, weakness, fatigue and confusion.  Denies CP, SOB, n/v/d and abdominal pain.  Appears a little confused.  Believes he had dialysis yesterday and initially did not recall going home from the hospital. In the ED Hgb 6.7, dropped from 7.2 earlier that day.  BUN elevated at 141.   Objective Vitals:   05/05/21 1025 05/05/21 1030 05/05/21 1045 05/05/21 1057  BP: (!) 115/55 127/63 127/63 132/68  Pulse: 85 88 88 91  Resp: 19 16 16    Temp: 98.2 F (36.8 C) 98 F (36.7 C) 98.3 F (36.8 C)   TempSrc: Oral Oral Oral   SpO2: 99%  99%    Physical Exam General:chronically ill appearing male in NAD Heart:RRR, no mrg Lungs:CTAB, nml WOB on RA Abdomen:soft, NTND Extremities:no LE edema Dialysis Access: LU AVF in use   Filed Weights    Intake/Output Summary (Last 24 hours) at 05/05/2021 1106 Last data filed at 05/05/2021 0930 Gross per 24 hour  Intake 315 ml  Output --  Net 315 ml    Additional Objective Labs: Basic Metabolic Panel: Recent Labs  Lab 05/03/21 2135 05/04/21 0734 05/05/21 0308  NA 136 136 137  K 4.4 5.4* 5.4*  CL 98 100 101  CO2 27 25 22   GLUCOSE 147* 117* 109*  BUN 96* 115* 141*  CREATININE 5.98* 6.96* 8.50*  CALCIUM 8.0* 7.8* 7.9*  PHOS 4.2 5.3*  --    Liver Function Tests: Recent Labs  Lab 05/02/21 0743 05/03/21 0046  AST 22 25  ALT 15 15  ALKPHOS 44 31*  BILITOT 0.6 0.7  PROT 6.9 6.0*  ALBUMIN 3.1* 2.8*   Recent Labs  Lab 05/02/21 0743  LIPASE 87*   CBC: Recent Labs  Lab 05/02/21 0743 05/03/21 0046 05/03/21 0101 05/03/21 2135 05/04/21 0734 05/05/21 0308  WBC 5.7 6.5  --  5.5 5.6 5.0  NEUTROABS  --  4.7  --   --   --   --   HGB 9.5* 7.0*   < > 7.2* 7.2* 6.7*  HCT 31.0* 22.7*   < > 22.2* 21.6* 21.4*  MCV 90.9 92.3  --  89.2 88.9 94.7  PLT 124* 101*  --  80*  79* 84*   < > = values in this interval not displayed.   Blood Culture    Component Value Date/Time   SDES BLOOD RIGHT FOREARM 05/03/2021 0052   SPECREQUEST  05/03/2021 0052    BOTTLES DRAWN AEROBIC AND ANAEROBIC Blood Culture adequate volume   CULT  05/03/2021 0052    NO GROWTH 2 DAYS Performed at [facility], [place] [address], [place], [place] [address]    REPTSTATUS PENDING 05/03/2021 0052    CBG: Recent Labs  Lab 05/03/21 1924 05/03/21 2310 05/04/21 0321 05/04/21 0729 05/05/21 0730  GLUCAP 160* 114* 98 117* 114*   Medications:  sodium chloride     sodium chloride      sodium chloride   Intravenous Once   atorvastatin  10 mg Oral Daily   bictegravir-emtricitabine-tenofovir AF  1 tablet Oral Daily   calcium acetate  1,334 mg Oral TID WC   Chlorhexidine Gluconate Cloth  6 each Topical N4627   folic acid  1 mg Oral Daily   gabapentin  300 mg Oral Daily   insulin aspart  0-9 Units Subcutaneous Q4H  latanoprost  1 drop Both Eyes QHS   multivitamin  1 tablet Oral Daily   pantoprazole (PROTONIX) IV  40 mg Intravenous Q12H   thiamine injection  100 mg Intravenous Daily   timolol  1 drop Both Eyes q AM   umeclidinium-vilanterol  1 puff Inhalation Daily    Dialysis Orders: MWF - [facility]  4hrs, BFR 400, DFR AF 1.5,  EDW 76.5 kg, 2K/ 2Ca   Access: LU AVF  Heparin none Mircera 100 mcg q2wks - last 261mcg on 6/22   Assessment/Plan:  Recurrent GIB - known Hx gastric/small bowel AVMs. s/p 2 units pRBC 7/11. Additional 2units ordered for today. needs cardiac clearance prior to EGD per last admit.   Lethargy/confusion - in setting of acute GIB and uremia, BUN 141 today.  Hopeful for improvement post HD.  ESRD - on HD MWF.  HD today per regular schedule.  K 5.6.   Hypertension/volume  - Blood pressure in goal. Does not appear grossly volume overloaded.UF as tolerated.   Anemia of CKD - Hgb drop 7.2>6.7 due to #1. 2 units pRBC ordered. Will order aranesp to be given with  HD today.   Secondary Hyperparathyroidism - Ca and phos in goal.  Not on VDRA. Continue binders, phoslo 2 AC TID and 1 with snacks when eating.  Nutrition - Renal diet w/fluid restrictions when advanced.  DM2 - per PMD Hx CAD s/p CABG x2 (2012) and stent placement in 3545/[id number] systolic/diastolic HF - per PMD Hx COPD Hx HIV  Jen Mow, PA-C [name] 05/05/2021,11:06 AM  LOS: 0 days   Pt seen, examined and agree w A/P as above.  Kelly Splinter  MD 05/05/2021, 3:51 PM

## 2021-05-05 NOTE — Procedures (Signed)
   I was present at this dialysis session, have reviewed the session itself and made  appropriate changes Kelly Splinter MD [place] pager [phone]   05/05/2021, 3:52 PM

## 2021-05-05 NOTE — ED Provider Notes (Signed)
[facility] EMERGENCY DEPARTMENT Provider Note   CSN: [id number] Arrival date & time: 05/04/21  1552     History Chief Complaint  Patient presents with   Blood In Stools    Jacob [name] is a 70 y.o. male.  HPI Patient presents with anemia.  History of same.  States he has continued to have bleeding.  Recent admission for the same but then left AMA.  History of GI bleeds.  History of HIV.  History of dialysis.  In the morning and was sent back to the ER.  States he has had black stool and blood visible.  States he is feeling little more confused.    Past Medical History:  Diagnosis Date   Acute respiratory failure ([place]) 03/01/2018   Anemia    Arthritis    "all over; mostly knees and back" (02/28/2018)   Chronic combined systolic and diastolic CHF (congestive heart failure) (HCC)    Chronic lower back pain    stenosis   Community acquired pneumonia 09/06/2013   COPD (chronic obstructive pulmonary disease) ([place])    Coronary atherosclerosis of native coronary artery    a. 02/2003 s/p CABG x 2 (VG->RI, VG->RPDA; b. 11/2019 PCI: LM nl, LAD 90d, D3 50, RI 100, LCX 100p, OM3 100 - fills via L->L collats from D2/dLAD, RCA 100p, VG->RPDA ok, VG->RI 95 (3.5x48 Synergy XD DES).   Drug abuse ([place])    hx; tested for cocaine as recently as 2/08. says he is not using drugs now - avoided defib. for this reason    ESRD (end stage renal disease) ([place])    Hemo M-W-F- Richarda Blade   Fall at home 10/2020   GERD (gastroesophageal reflux disease)    takes OTC meds as needed   GI bleeding    a. 11/2019 EGD: angiodysplastic lesions w/ bleeding s/p argon plasma/clipping/epi inj. Multiple admissions for the same.   Glaucoma    uses eye drops daily   Hepatitis B 1968   "tx'd w/isolation; caught it from toilet stools in gym"   History of blood transfusion 03/01/2019   History of colon polyps    benign   History of gout    takes Allopurinol daily as well as Colchicine-if needed  (02/28/2018)   History of kidney stones    HTN (hypertension)    takes Coreg,Imdur.and Apresoline daily   Human immunodeficiency virus (HIV) disease ([place]) dx'd 1995   on [place] as of 12/2020.     Hyperlipidemia    Ischemic cardiomyopathy    a. 01/2019 Echo: EF 40-45%, diffuse HK, mild basal septal hypertrophy. Diast dysfxn. Nl RV size/fxn. Sev dil LA. Triv MR/TR/PR.   Muscle spasm    takes Zanaflex as needed   Myocardial infarction ([place]) ~ 2004/2005   Nocturia    Peripheral neuropathy    takes gabapentin daily   Pneumonia    "at least twice" (02/28/2018)   SDH (subdural hematoma) (HCC)    Syphilis, unspecified    Type II diabetes mellitus ([place]) 2004   Lantus daily.Average fasting blood sugar 125-199   Wears glasses    Wears partial dentures     Patient Active Problem List   Diagnosis Date Noted   Thrombocytopenia ([place])    Helicobacter pylori (H. pylori) infection 04/06/2021   Lumbar foraminal stenosis 03/30/2021   Gastric hemorrhage due to angiodysplasia of stomach    GI bleed 11/24/2020   COVID-19 virus infection 11/24/2020   Other mechanical complication of cranial or spinal infusion catheter,  sequela 11/11/2020   Subdural hematoma ([place]) 11/06/2020   Fall at home, initial encounter 11/05/2020   Nicotine dependence, cigarettes, uncomplicated 13/05/6577   Alcohol abuse 11/05/2020   Unspecified trochanteric fracture of left femur, initial encounter for closed fracture ([place]) 11/05/2020   Traumatic subdural hematoma, initial encounter ([place]) 11/05/2020   Fracture of metatarsal of right foot, closed 11/05/2020   Nondisplaced fracture of greater trochanter of left femur, initial encounter for closed fracture ([place])    Uremia    Angiodysplasia of intestine with hemorrhage    Pruritus, unspecified 10/02/2020   Iron deficiency anemia    Acute blood loss anemia    Acute on chronic anemia    Abnormal nuclear stress test 12/19/2019   Type 2 diabetes mellitus with diabetic neuropathy,  unspecified ([place]) 08/07/2019   Allergy, unspecified, initial encounter 46/96/2952   Complication of vascular dialysis catheter 08/02/2019   Drug abuse counseling and surveillance of drug abuser 08/02/2019   Other specified coagulation defects ([place]) 08/02/2019   Secondary hyperparathyroidism of renal origin ([place]) 08/02/2019   Unspecified atrial fibrillation ([place]) 08/02/2019   ESRD on hemodialysis ([place])    Cocaine abuse ([place])    CHF exacerbation ([place]) 07/21/2019   Diabetic foot ulcer ([place]) 02/11/2019   AVM (arteriovenous malformation)    Melena 02/07/2019   Symptomatic anemia 02/06/2019   Hyperlipidemia associated with type 2 diabetes mellitus ([place]) 05/30/2018   Right foot ulcer ([place]) 02/28/2018   Chronic obstructive pulmonary disease ([place]) 02/28/2018   Anemia of chronic disease 11/20/2016   CHF (congestive heart failure) ([place]) 11/10/2016   History of lumbar laminectomy for spinal cord decompression 02/29/2016   Type 2 diabetes mellitus with diabetic polyneuropathy, with long-term current use of insulin ([place]) 06/17/2015   HTN (hypertension) 04/26/2015   DM (diabetes mellitus) ([place]) 04/26/2015   Ischemic cardiomyopathy 05/12/2014   Shock ([place]) 09/06/2013   Bunion of left foot 11/25/2011   Bunion, right foot 11/25/2011   Polysubstance abuse ([place]) 07/28/2011   Hip fracture, left ([place]) 04/04/2011   Closed fracture of neck of femur ([place]) 04/04/2011   Insomnia 02/18/2011   Coronary artery disease involving native heart without angina pectoris 04/20/2009   Chronic combined systolic (congestive) and diastolic (congestive) heart failure ([place]) 04/20/2009   Mixed hyperlipidemia 11/20/2006   Gout 11/20/2006   TOBACCO ABUSE 11/20/2006   Essential hypertension 11/20/2006   Human immunodeficiency virus (HIV) disease ([place]) 09/02/2006    Past Surgical History:  Procedure Laterality Date   AV FISTULA PLACEMENT Left 08/02/2018   Procedure: ARTERIOVENOUS (AV) FISTULA CREATION  left arm  radiocephlic;  Surgeon: Marty Heck, MD;  Location: [place];  Service: Vascular;  Laterality: Left;   AV FISTULA PLACEMENT Left 08/01/2019   Procedure: LEFT BRACHIOCEPHALIC ARTERIOVENOUS (AV) FISTULA CREATION;  Surgeon: [name], MD;  Location: [place];  Service: Vascular;  Laterality: Left;   [place] Left 10/03/2019   Procedure: BASILIC VEIN TRANSPOSITION LEFT SECOND STAGE;  Surgeon: [name], MD;  Location: [place];  Service: Vascular;  Laterality: Left;   BIOPSY  01/25/2021   Procedure: BIOPSY;  Surgeon: Doran Stabler, MD;  Location: [facility] ENDOSCOPY;  Service: Gastroenterology;;   CARDIAC CATHETERIZATION  10/2002; 12/19/2004   [name] 03/08/2011   COLONOSCOPY  2013   [place]    CORONARY ARTERY BYPASS GRAFT  02/24/2003   CABG X2/notes 03/08/2011   CORONARY STENT INTERVENTION N/A 12/19/2019   Procedure: CORONARY STENT INTERVENTION;  Surgeon: Jettie Booze, MD;  Location: [name]  CV LAB;  Service: Cardiovascular;  Laterality: N/A;   ENTEROSCOPY N/A 01/25/2021   Procedure: ENTEROSCOPY;  Surgeon: Doran Stabler, MD;  Location: [place];  Service: Gastroenterology;  Laterality: N/A;   ENTEROSCOPY N/A 02/13/2021   Procedure: ENTEROSCOPY;  Surgeon: Jackquline Denmark, MD;  Location: [facility] ENDOSCOPY;  Service: Endoscopy;  Laterality: N/A;   ESOPHAGOGASTRODUODENOSCOPY (EGD) WITH PROPOFOL N/A 02/08/2019   Procedure: ESOPHAGOGASTRODUODENOSCOPY (EGD) WITH PROPOFOL;  Surgeon: Milus Banister, MD;  Location: [place];  Service: Gastroenterology;  Laterality: N/A;   ESOPHAGOGASTRODUODENOSCOPY (EGD) WITH PROPOFOL N/A 12/22/2019   Procedure: ESOPHAGOGASTRODUODENOSCOPY (EGD) WITH PROPOFOL;  Surgeon: Lavena Bullion, DO;  Location: [place];  Service: Gastroenterology;  Laterality: N/A;   ESOPHAGOGASTRODUODENOSCOPY (EGD) WITH PROPOFOL N/A 10/19/2020   Procedure: ESOPHAGOGASTRODUODENOSCOPY (EGD) WITH PROPOFOL;  Surgeon: Jackquline Denmark, MD;  Location: [facility]  ENDOSCOPY;  Service: Endoscopy;  Laterality: N/A;   ESOPHAGOGASTRODUODENOSCOPY (EGD) WITH PROPOFOL N/A 12/22/2020   Procedure: ESOPHAGOGASTRODUODENOSCOPY (EGD) WITH PROPOFOL;  Surgeon: Gatha Mayer, MD;  Location: [place];  Service: Endoscopy;  Laterality: N/A;   ESOPHAGOGASTRODUODENOSCOPY (EGD) WITH PROPOFOL N/A 01/09/2021   Procedure: ESOPHAGOGASTRODUODENOSCOPY (EGD) WITH PROPOFOL;  Surgeon: Irene Shipper, MD;  Location: [facility] ENDOSCOPY;  Service: Endoscopy;  Laterality: N/A;   HEMOSTASIS CLIP PLACEMENT  12/22/2019   Procedure: HEMOSTASIS CLIP PLACEMENT;  Surgeon: Lavena Bullion, DO;  Location: [place] ENDOSCOPY;  Service: Gastroenterology;;   HEMOSTASIS CLIP PLACEMENT  12/22/2020   Procedure: HEMOSTASIS CLIP PLACEMENT;  Surgeon: Gatha Mayer, MD;  Location: [facility] ENDOSCOPY;  Service: Endoscopy;;   HEMOSTASIS CONTROL  12/22/2020   Procedure: HEMOSTASIS CONTROL/hemospray;  Surgeon: Gatha Mayer, MD;  Location: [place];  Service: Endoscopy;;   HOT HEMOSTASIS N/A 02/08/2019   Procedure: HOT HEMOSTASIS (ARGON PLASMA COAGULATION/BICAP);  Surgeon: Milus Banister, MD;  Location: [facility] ENDOSCOPY;  Service: Gastroenterology;  Laterality: N/A;   HOT HEMOSTASIS N/A 12/22/2019   Procedure: HOT HEMOSTASIS (ARGON PLASMA COAGULATION/BICAP);  Surgeon: Lavena Bullion, DO;  Location: [facility] ENDOSCOPY;  Service: Gastroenterology;  Laterality: N/A;   HOT HEMOSTASIS N/A 10/19/2020   Procedure: HOT HEMOSTASIS (ARGON PLASMA COAGULATION/BICAP);  Surgeon: Jackquline Denmark, MD;  Location: [facility] ENDOSCOPY;  Service: Endoscopy;  Laterality: N/A;   HOT HEMOSTASIS N/A 12/22/2020   Procedure: HOT HEMOSTASIS (ARGON PLASMA COAGULATION/BICAP);  Surgeon: Gatha Mayer, MD;  Location: [facility] ENDOSCOPY;  Service: Endoscopy;  Laterality: N/A;   HOT HEMOSTASIS N/A 01/09/2021   Procedure: HOT HEMOSTASIS (ARGON PLASMA COAGULATION/BICAP);  Surgeon: Irene Shipper, MD;  Location: [facility] ENDOSCOPY;  Service: Endoscopy;  Laterality: N/A;   HOT  HEMOSTASIS N/A 01/25/2021   Procedure: HOT HEMOSTASIS (ARGON PLASMA COAGULATION/BICAP);  Surgeon: Doran Stabler, MD;  Location: [place];  Service: Gastroenterology;  Laterality: N/A;   HOT HEMOSTASIS N/A 02/13/2021   Procedure: HOT HEMOSTASIS (ARGON PLASMA COAGULATION/BICAP);  Surgeon: Jackquline Denmark, MD;  Location: [facility] ENDOSCOPY;  Service: Endoscopy;  Laterality: N/A;   INTERTROCHANTERIC HIP FRACTURE SURGERY Left 11/2006   [name] 03/08/2011   INTRAVASCULAR ULTRASOUND/IVUS N/A 12/19/2019   Procedure: Intravascular Ultrasound/IVUS;  Surgeon: Jettie Booze, MD;  Location: [place] CV LAB;  Service: Cardiovascular;  Laterality: N/A;   IR FLUORO GUIDE CV LINE RIGHT  07/24/2019   IR FLUORO GUIDE CV LINE RIGHT  07/30/2019   IR US GUIDE VASC ACCESS RIGHT  07/24/2019   IR US GUIDE VASC ACCESS RIGHT  07/30/2019   LAPAROSCOPIC CHOLECYSTECTOMY  05/2006   LIGATION OF COMPETING BRANCHES OF ARTERIOVENOUS FISTULA Left 11/05/2018   Procedure:  LIGATION OF COMPETING BRANCHES OF ARTERIOVENOUS FISTULA  LEFT  ARM;  Surgeon: Marty Heck, MD;  [facility];  Service: Vascular;  Laterality: Left;   LUMBAR LAMINECTOMY/DECOMPRESSION MICRODISCECTOMY N/A 02/29/2016   Procedure: Left L4-5 Lateral Recess Decompression, Removal Extradural Intraspinal Facet Cyst;  Surgeon: Marybelle Killings, MD;  Location: [place];  Service: Orthopedics;  Laterality: N/A;   MULTIPLE TOOTH EXTRACTIONS     ORIF MANDIBULAR FRACTURE Left 08/13/2004   ORIF of left body fracture mandible with KLS Martin 2.3-mm six hole/notes 03/08/2011   RIGHT/LEFT HEART CATH AND CORONARY/GRAFT ANGIOGRAPHY N/A 12/19/2019   Procedure: RIGHT/LEFT HEART CATH AND CORONARY/GRAFT ANGIOGRAPHY;  Surgeon: Jettie Booze, MD;  Location: [place] CV LAB;  Service: Cardiovascular;  Laterality: N/A;   SCLEROTHERAPY  12/22/2019   Procedure: SCLEROTHERAPY;  Surgeon: Lavena Bullion, DO;  Location: [facility] ENDOSCOPY;  Service: Gastroenterology;;   Clide Deutscher   02/13/2021   Procedure: Clide Deutscher;  Surgeon: Jackquline Denmark, MD;  Location: [facility] ENDOSCOPY;  Service: Endoscopy;;       Family History  Problem Relation Age of Onset   Heart failure Father    Hypertension Father    Diabetes Brother    Heart attack Brother    Alzheimer's disease Mother    Stroke Sister    Diabetes Sister    Alzheimer's disease Sister    Hypertension Brother    Diabetes Brother    Drug abuse Brother    Colon cancer Neg Hx     Social History   Tobacco Use   Smoking status: Every Day    Packs/day: 0.50    Years: 43.00    Pack years: 21.50    Types: Cigarettes   Smokeless tobacco: Never  Vaping Use   Vaping Use: Never used  Substance Use Topics   Alcohol use: Not Currently    Alcohol/week: 12.0 standard drinks    Types: 12 Standard drinks or equivalent per week    Comment: occassional   Drug use: Not Currently    Types: Cocaine    Comment: hx of crack/cocaine 90yrs ago 10/01/2019- none    Home Medications Prior to Admission medications   Medication Sig Start Date End Date Taking? Authorizing Provider  acetaminophen (TYLENOL) 650 MG CR tablet Take 1,300 mg by mouth 3 (three) times daily.   Yes [provider]  allopurinol (ZYLOPRIM) 100 MG tablet TAKE 1 TABLET(100 MG) BY MOUTH DAILY Patient taking differently: Take 100 mg by mouth daily. 01/12/21  Yes [name], Dinah C, NP  ANORO ELLIPTA 62.5-25 MCG/INH AEPB INHALE 1 PUFF BY MOUTH EVERY DAY Patient taking differently: Inhale 1 puff into the lungs daily. 12/21/20  Yes [name], NP  aspirin EC 81 MG tablet Take 81 mg by mouth daily. Swallow whole.   Yes [provider]  atorvastatin (LIPITOR) 10 MG tablet Take 1 tablet (10 mg total) by mouth daily. 03/02/21 05/31/21 Yes Dunn, Dayna N, PA-C  B-D UF III MINI PEN NEEDLES 31G X 5 MM MISC USE FOUR TIMES DAILY 03/02/20  Yes [name], NP  bictegravir-emtricitabine-tenofovir AF (BIKTARVY) 50-200-25 MG TABS tablet Take 1 tablet by  mouth daily. 06/30/20  Yes [name], MD  Calcium Acetate 667 MG TABS Take [phone] mg by mouth See admin instructions. Take 1,334 mg by mouth three times a day with meals and 667 mg with each snack 01/20/21  Yes [provider]  carvedilol (COREG) 3.125 MG tablet TAKE 1 TABLET(3.125 MG) BY MOUTH TWICE DAILY Patient taking differently:  Take 3.125 mg by mouth 2 (two) times daily with a meal. 04/14/21  Yes [name], MD  colchicine 0.6 MG tablet TAKE 1 TABLET BY MOUTH AS NEEDED FOR GOUT Patient taking differently: Take 0.6 mg by mouth daily as needed (gout flares). 04/14/21  Yes [name], Dinah C, NP  diclofenac Sodium (VOLTAREN) 1 % GEL APPLY 2 GRAMS TOPICALLY TO THE AFFECTED AREA THREE TIMES DAILY AS NEEDED FOR PAIN 04/14/21  Yes [name], NP  folic acid (FOLVITE) 1 MG tablet Take 1 tablet (1 mg total) by mouth daily. 01/12/21  Yes [name], Dinah C, NP  gabapentin (NEURONTIN) 300 MG capsule Take 1 capsule (300 mg total) by mouth daily. Dose change due to renal function 12/09/20  Yes Sherrie Mustache K, NP  glucose blood (ONETOUCH ULTRA) test strip Check blood sugar three times daily. Dx:E11.40 03/02/21  Yes Eubanks, [name], NP  insulin lispro (HUMALOG) 100 UNIT/ML KwikPen INJECT 20 UNITS UNDER THE SKIN THREE TIMES DAILY AS NEEDED FOR HIGH BLOOD SUGAR(ABOVE 150) Patient taking differently: Inject 20 Units into the skin 3 (three) times daily as needed (blood sugar above 150). 02/01/21  Yes [name], NP  isosorbide mononitrate (IMDUR) 30 MG 24 hr tablet TAKE 1 TABLET BY MOUTH EVERY DAY Patient taking differently: Take 30 mg by mouth daily. 01/12/21  Yes [name], Dinah C, NP  Lancets (ONETOUCH DELICA PLUS [id number]) MISC Inject 1 Device as directed 3 (three) times daily. Dx: E11.40 09/18/19  Yes [name], NP  latanoprost (XALATAN) 0.005 % ophthalmic solution Place 1 drop into both eyes at bedtime.   Yes [provider]  lidocaine-prilocaine (EMLA) cream  Apply 1 application topically every Monday, Wednesday, and Friday with hemodialysis.   Yes [provider]  Methoxy PEG-Epoetin Beta (MIRCERA IJ) Mircera 12/25/20  Yes [provider]  multivitamin (RENA-VIT) TABS tablet Take 1 tablet by mouth daily.   Yes [provider]  nitroGLYCERIN (NITROSTAT) 0.3 MG SL tablet PLACE 1 TABLET UNDER THE TONGUE AS NEEDED FOR CHEST PAIN Patient taking differently: Place 0.3 mg under the tongue every 5 (five) minutes as needed for chest pain. 04/14/21  Yes [name], MD  oxyCODONE-acetaminophen (PERCOCET/ROXICET) 5-325 MG tablet Take 1 tablet by mouth daily as needed for severe pain.   Yes [provider]  pantoprazole (PROTONIX) 40 MG tablet Take 1 tablet (40 mg total) by mouth 2 (two) times daily before a meal. 02/16/21  Yes Thurnell Lose, MD  polyethylene glycol (MIRALAX / GLYCOLAX) 17 g packet Take 17 g by mouth daily as needed for mild constipation. 11/07/20  Yes Aline August, MD  timolol (TIMOPTIC) 0.5 % ophthalmic solution Place 1 drop into both eyes in the morning. 04/04/19  Yes [provider]    Allergies    Augmentin [amoxicillin-pot clavulanate], Mucinex fast-max, and Amphetamines  Review of Systems   Review of Systems  Constitutional:  Negative for appetite change.  HENT:  Negative for congestion.   Respiratory:  Positive for shortness of breath.   Cardiovascular:  Negative for chest pain.  Gastrointestinal:  Positive for abdominal pain and blood in stool.  Genitourinary:  Negative for flank pain.  Musculoskeletal:  Negative for back pain.  Skin:  Negative for rash.  Neurological:  Positive for light-headedness.  Psychiatric/Behavioral:  Negative for confusion.    Physical Exam Updated Vital Signs BP 124/62   Pulse 85   Temp 98.4 F (36.9 C) (Oral)   Resp 12   SpO2  100%   Physical Exam Vitals and nursing note reviewed.  Constitutional:      Appearance: Normal appearance.  HENT:      Head: Atraumatic.  Eyes:     Pupils: Pupils are equal, round, and reactive to light.  Cardiovascular:     Rate and Rhythm: Regular rhythm.  Pulmonary:     Breath sounds: No wheezing or rhonchi.  Abdominal:     Comments: Mild abdominal tenderness out rebound or guarding.  No hernia palpated.  Musculoskeletal:        General: No tenderness.  Skin:    General: Skin is warm.     Capillary Refill: Capillary refill takes less than 2 seconds.  Neurological:     Mental Status: He is alert and oriented to person, place, and time.    ED Results / Procedures / Treatments   Labs (all labs ordered are listed, but only abnormal results are displayed) Labs Reviewed  CBC - Abnormal; Notable for the following components:      Result Value   RBC 2.26 (*)    Hemoglobin 6.7 (*)    HCT 21.4 (*)    RDW 18.0 (*)    Platelets 84 (*)    nRBC 0.6 (*)    All other components within normal limits  BASIC METABOLIC PANEL - Abnormal; Notable for the following components:   Potassium 5.4 (*)    Glucose, Bld 109 (*)    BUN 141 (*)    Creatinine, Ser 8.50 (*)    Calcium 7.9 (*)    GFR, Estimated 6 (*)    All other components within normal limits  CBG MONITORING, ED - Abnormal; Notable for the following components:   Glucose-Capillary 114 (*)    All other components within normal limits  PROTIME-INR  HEMOGLOBIN AND HEMATOCRIT, BLOOD  HEMOGLOBIN AND HEMATOCRIT, BLOOD  HEMOGLOBIN AND HEMATOCRIT, BLOOD  TYPE AND SCREEN  PREPARE RBC (CROSSMATCH)    EKG None  Radiology No results found.  Procedures Procedures   Medications Ordered in ED Medications  acetaminophen (TYLENOL) tablet 650 mg (has no administration in time range)    Or  acetaminophen (TYLENOL) suppository 650 mg (has no administration in time range)  ondansetron (ZOFRAN) tablet 4 mg (has no administration in time range)    Or  ondansetron (ZOFRAN) injection 4 mg (has no administration in time range)  umeclidinium-vilanterol  (ANORO ELLIPTA) 62.5-25 MCG/INH 1 puff (has no administration in time range)  atorvastatin (LIPITOR) tablet 10 mg (has no administration in time range)  bictegravir-emtricitabine-tenofovir AF (BIKTARVY) 50-200-25 MG per tablet 1 tablet (has no administration in time range)  multivitamin (RENA-VIT) tablet 1 tablet (has no administration in time range)  timolol (TIMOPTIC) 0.5 % ophthalmic solution 1 drop (0 drops Both Eyes Hold 05/05/21 0735)  latanoprost (XALATAN) 0.005 % ophthalmic solution 1 drop (has no administration in time range)  gabapentin (NEURONTIN) capsule 300 mg (has no administration in time range)  calcium acetate (PHOSLO) capsule 667 mg (has no administration in time range)  calcium acetate (PHOSLO) capsule 1,334 mg (has no administration in time range)  insulin aspart (novoLOG) injection 0-9 Units (0 Units Subcutaneous Not Given 05/05/21 0737)  pantoprazole (PROTONIX) injection 40 mg (40 mg Intravenous Given 05/05/21 0735)  Chlorhexidine Gluconate Cloth 2 % PADS 6 each (0 each Topical Hold 05/05/21 0635)  0.9 %  sodium chloride infusion (Manually program via Guardrails IV Fluids) ( Intravenous New Bag/Given 05/05/21 0867)    ED Course  I have reviewed the  triage vital signs and the nursing notes.  Pertinent labs & imaging results that were available during my care of the patient were reviewed by me and considered in my medical decision making (see chart for details).    MDM Rules/Calculators/A&P                          Patient presents with anemia.  History of same.  Left AMA from the hospital yesterday for the same thing.  Hemoglobin is decreased.  More lightheaded but blood pressure maintained.  Refused rectal exam.  Hemoglobin has gone down to 6.7 now.  Will admit to internal medicine.  Also is end-stage renal disease and due for dialysis.   Final Clinical Impression(s) / ED Diagnoses Final diagnoses:  Gastrointestinal hemorrhage, unspecified gastrointestinal hemorrhage type   Symptomatic anemia  End stage renal disease on dialysis [facility])    Rx / DC Orders ED Discharge Orders     None        [name], MD 05/05/21 0740

## 2021-05-05 NOTE — ED Notes (Signed)
MD Pickering notified of critical Hemoglobin

## 2021-05-05 NOTE — Progress Notes (Signed)
NEW ADMISSION NOTE New Admission Note:   Arrival Method: patient arrived from ED Mental Orientation: alert and oriented x 4, forgetful Telemetry: 17M-13 NSR Assessment: Completed Skin: dry and intact. IV: R AC SL Pain: denies any pain. Tubes: N/A Safety Measures: Safety Fall Prevention Plan has been given, discussed and signed Admission: Completed [facility] [place] Orientation: Patient has been orientated to the room, unit and staff.  Family:  Orders have been reviewed and implemented. Will continue to monitor the patient. Call light has been placed within reach and bed alarm has been activated.   Amaryllis Dyke, RN

## 2021-05-05 NOTE — Progress Notes (Signed)
Patient Name: Jacob [name] Date of Encounter: 05/05/2021, 1:39 PM    Subjective  Patient is confused denies having the bleeding that has been reported, does not remember being in the hospital yesterday says he does not know where he is right now and wants to go home.  He just got back from dialysis, he is in his hall bed in the ER.   He has known history of chronic recurrent upper GI bleeding and AVMs and a questionable Dieulafoy lesion in the upper intestine recently treated by Dr. Lyndel Safe.  He came back into the hospital this week with suspected melena decline in hemoglobin and he refused to have further evaluation with enteroscopy that I recommended and went home.  That was yesterday and he is back now with further decline in hemoglobin and reports of melena.  Here is the HPI from the consult of July 11 Jacob [name] is a 70 y.o. male with a past medical history significant for HTN, CAD / CABG, ischemic cardiomyopathy, DM, HIV, ESRD on HD M / W/ Fr.,HIV, COPD, diabetes, polysubstance abuse, glaucome,  and recurrent GI bleed secondary to AVM. See PMH for additional medical history.   Patient has a history of recurrent GI bleeding secondary to AVMs ( most recent was late April 2022). He has required multiple EGDs with APC of gastric and small bowel AVMs ( most recent was late April 2022)   Patient brought to ED yesterday with sob , abdominal pain and brown emeisis. . Recently diagnosed with H.pylori. in ED his hgb was 7 ( BL hgb  hard to discern with frequent GI bleeds but it was mid 9.5  in early May. Non cont CTAP shows a small amount of biliary air, negative pancreas, s/p cholecystectomy ( see full report below)   History mainly comes from nursing staff  and record. Patient is very sleepy, keeps falling asleep during interview. BUN > 130. He actually denies any recent abdominal pain or emesis but probably too sleep to remember. However he does endorse dark stools for about one week. He  no  longer takes anticoagulant at home but does take a daily baby ASA. BID PPI on home med list. Patient says he completed the course of antibiotics for H.pylori last week.  We apparently referred him to ID for a persistent infection.  Objective  BP 130/65 (BP Location: Right Arm)   Pulse 90   Temp 98.2 F (36.8 C) (Oral)   Resp 15   SpO2 99%  Chronically ill-appearing black man in no acute distress He is somewhat agitated says he wants to go home says he has not had anything to eat in several days does not remember being in the hospital yesterday  CBC Latest Ref Rng & Units 05/05/2021 05/04/2021 05/03/2021  WBC 4.0 - 10.5 K/uL 5.0 5.6 5.5  Hemoglobin 13.0 - 17.0 g/dL 6.7(LL) 7.2(L) 7.2(L)  Hematocrit 39.0 - 52.0 % 21.4(L) 21.6(L) 22.2(L)  Platelets 150 - 400 K/uL 84(L) 79(L) 80(L)   Lab Results  Component Value Date   INR 1.0 05/05/2021   INR 1.3 (H) 05/03/2021   INR 1.2 02/13/2021     Recent Labs  Lab 05/02/21 0743 05/03/21 0046 05/03/21 0101 05/03/21 2135 05/04/21 0734 05/05/21 0308  NA 136 138 138 136 136 137  K 5.0 5.2* 4.8 4.4 5.4* 5.4*  CL 93* 97* 99 98 100 101  CO2 29 21*  --  27 25 22   GLUCOSE 165* 170* 169* 147* 117* 109*  BUN 103* 157* >130* 96* 115* 141*  CREATININE 7.65* 8.80* 9.40* 5.98* 6.96* 8.50*  CALCIUM 8.6* 8.1*  --  8.0* 7.8* 7.9*  MG  --   --   --  2.1 2.1  --   PHOS  --   --   --  4.2 5.3*  --       Assessment and Plan  Recurrent GI bleeding with AVMs gastric and small bowel questionable Dieulafoy lesion recently here with recurrent melena and bleeding  Confusion likely metabolic encephalopathy question other  End-stage renal disease  Coronary artery disease with combined systolic and diastolic heart failure  Chronically elevated lipase  Chronic mild thrombocytopenia query liver disease   He needs another evaluation with enteroscopy but his mental status and attitude will not allow me to set that up.  His confusion needs to be sorted  out.  I will see him tomorrow I will let him eat.  This is not urgent.   Gatha Mayer, MD, [facility] 05/05/2021 1:39 PM

## 2021-05-05 NOTE — Progress Notes (Signed)
Hemodialysis- Patient completed approx 2.75 hours of treatment before signing off treatment. Received 2 units prbcs along with Aranesp. No fluid removed due to excessive cramping and generalized pain. Offered medication but denied. Bed not available yet. Report called to [facility] in ED. Patient remains alert x3 but confused/irritable at times.

## 2021-05-05 NOTE — ED Notes (Signed)
Dr. Gardner at bedside 

## 2021-05-05 NOTE — H&P (Signed)
History and Physical    Jacob [name] DGL:[id number] DOB: July 06, 1951 DOA: 05/04/2021  PCP: [name], NP  Patient coming from: Home  I have personally briefly reviewed patient's old medical records in [place]  Chief Complaint: GI bleed  HPI: Jacob [name] is a 70 y.o. male with medical history significant of ESRD, cocaine and EtOH abuse (EtOH not for months, cocaine sounds more recent though); HIV on HAART, HTN, DM2.  Pt battling with recurrent GI bleeds over past several months.  Admitted in April with GIB due to bleeding AVMs and a Dieulafoy lesion in duodenal bulb.  Pt admitted 2 days ago on 7/11 with GIB and hemorrhagic shock.  Also AMS due to uremia.  Pt got dialysis.  Plan was for repeat EGD according to Dr. Carlean Purl note on 7/11 but pt requested to leave on 7/12.  Unfortunately pt back in to ED this AM, 7/13, with recurrent melena, hematochezia.  Also says he has mild confusion.  No CP, no SOB.   ED Course: HGB 6.7 down from 7.3 yesterday.  BUN 141  Platelets 84 (stable).   Review of Systems: As per HPI, otherwise all review of systems negative.  Past Medical History:  Diagnosis Date   Acute respiratory failure ([place]) 03/01/2018   Anemia    Arthritis    "all over; mostly knees and back" (02/28/2018)   Chronic combined systolic and diastolic CHF (congestive heart failure) (HCC)    Chronic lower back pain    stenosis   Community acquired pneumonia 09/06/2013   COPD (chronic obstructive pulmonary disease) ([place])    Coronary atherosclerosis of native coronary artery    a. 02/2003 s/p CABG x 2 (VG->RI, VG->RPDA; b. 11/2019 PCI: LM nl, LAD 90d, D3 50, RI 100, LCX 100p, OM3 100 - fills via L->L collats from D2/dLAD, RCA 100p, VG->RPDA ok, VG->RI 95 (3.5x48 Synergy XD DES).   Drug abuse ([place])    hx; tested for cocaine as recently as 2/08. says he is not using drugs now - avoided defib. for this reason    ESRD (end stage renal disease) ([place])    H[place] M-W-F-  [facility]   Fall at home 10/2020   GERD (gastroesophageal reflux disease)    takes OTC meds as needed   GI bleeding    a. 11/2019 EGD: angiodysplastic lesions w/ bleeding s/p argon plasma/clipping/epi inj. Multiple admissions for the same.   Glaucoma    uses eye drops daily   Hepatitis B 1968   "tx'd w/isolation; caught it from toilet stools in gym"   History of blood transfusion 03/01/2019   History of colon polyps    benign   History of gout    takes Allopurinol daily as well as Colchicine-if needed (02/28/2018)   History of kidney stones    HTN (hypertension)    takes Coreg,Imdur.and Apresoline daily   Human immunodeficiency virus (HIV) disease ([place]) dx'd 1995   on [place] as of 12/2020.     Hyperlipidemia    Ischemic cardiomyopathy    a. 01/2019 Echo: EF 40-45%, diffuse HK, mild basal septal hypertrophy. Diast dysfxn. Nl RV size/fxn. Sev dil LA. Triv MR/TR/PR.   Muscle spasm    takes Zanaflex as needed   Myocardial infarction ([place]) ~ 2004/2005   Nocturia    Peripheral neuropathy    takes gabapentin daily   Pneumonia    "at least twice" (02/28/2018)   SDH (subdural hematoma) (HCC)    Syphilis, unspecified  Type II diabetes mellitus ([place]) 2004   Lantus daily.Average fasting blood sugar 125-199   Wears glasses    Wears partial dentures     Past Surgical History:  Procedure Laterality Date   AV FISTULA PLACEMENT Left 08/02/2018   Procedure: ARTERIOVENOUS (AV) FISTULA CREATION  left arm radiocephlic;  Surgeon: Marty Heck, MD;  Location: [place];  Service: Vascular;  Laterality: Left;   AV FISTULA PLACEMENT Left 08/01/2019   Procedure: LEFT BRACHIOCEPHALIC ARTERIOVENOUS (AV) FISTULA CREATION;  Surgeon: [name], MD;  Location: [place];  Service: Vascular;  Laterality: Left;   [place] Left 10/03/2019   Procedure: BASILIC VEIN TRANSPOSITION LEFT SECOND STAGE;  Surgeon: [name], MD;  Location: [place];  Service: Vascular;  Laterality: Left;    BIOPSY  01/25/2021   Procedure: BIOPSY;  Surgeon: Doran Stabler, MD;  Location: [facility] ENDOSCOPY;  Service: Gastroenterology;;   CARDIAC CATHETERIZATION  10/2002; 12/19/2004   [name] 03/08/2011   COLONOSCOPY  2013   [place]    CORONARY ARTERY BYPASS GRAFT  02/24/2003   CABG X2/notes 03/08/2011   CORONARY STENT INTERVENTION N/A 12/19/2019   Procedure: CORONARY STENT INTERVENTION;  Surgeon: Jettie Booze, MD;  Location: [place] CV LAB;  Service: Cardiovascular;  Laterality: N/A;   ENTEROSCOPY N/A 01/25/2021   Procedure: ENTEROSCOPY;  Surgeon: Doran Stabler, MD;  Location: [place];  Service: Gastroenterology;  Laterality: N/A;   ENTEROSCOPY N/A 02/13/2021   Procedure: ENTEROSCOPY;  Surgeon: Jackquline Denmark, MD;  Location: [facility] ENDOSCOPY;  Service: Endoscopy;  Laterality: N/A;   ESOPHAGOGASTRODUODENOSCOPY (EGD) WITH PROPOFOL N/A 02/08/2019   Procedure: ESOPHAGOGASTRODUODENOSCOPY (EGD) WITH PROPOFOL;  Surgeon: Milus Banister, MD;  Location: [place];  Service: Gastroenterology;  Laterality: N/A;   ESOPHAGOGASTRODUODENOSCOPY (EGD) WITH PROPOFOL N/A 12/22/2019   Procedure: ESOPHAGOGASTRODUODENOSCOPY (EGD) WITH PROPOFOL;  Surgeon: Lavena Bullion, DO;  Location: [place];  Service: Gastroenterology;  Laterality: N/A;   ESOPHAGOGASTRODUODENOSCOPY (EGD) WITH PROPOFOL N/A 10/19/2020   Procedure: ESOPHAGOGASTRODUODENOSCOPY (EGD) WITH PROPOFOL;  Surgeon: Jackquline Denmark, MD;  Location: [facility] ENDOSCOPY;  Service: Endoscopy;  Laterality: N/A;   ESOPHAGOGASTRODUODENOSCOPY (EGD) WITH PROPOFOL N/A 12/22/2020   Procedure: ESOPHAGOGASTRODUODENOSCOPY (EGD) WITH PROPOFOL;  Surgeon: Gatha Mayer, MD;  Location: [place];  Service: Endoscopy;  Laterality: N/A;   ESOPHAGOGASTRODUODENOSCOPY (EGD) WITH PROPOFOL N/A 01/09/2021   Procedure: ESOPHAGOGASTRODUODENOSCOPY (EGD) WITH PROPOFOL;  Surgeon: Irene Shipper, MD;  Location: [facility] ENDOSCOPY;  Service: Endoscopy;  Laterality: N/A;   HEMOSTASIS CLIP  PLACEMENT  12/22/2019   Procedure: HEMOSTASIS CLIP PLACEMENT;  Surgeon: Lavena Bullion, DO;  Location: [place] ENDOSCOPY;  Service: Gastroenterology;;   HEMOSTASIS CLIP PLACEMENT  12/22/2020   Procedure: HEMOSTASIS CLIP PLACEMENT;  Surgeon: Gatha Mayer, MD;  Location: [facility] ENDOSCOPY;  Service: Endoscopy;;   HEMOSTASIS CONTROL  12/22/2020   Procedure: HEMOSTASIS CONTROL/hemospray;  Surgeon: Gatha Mayer, MD;  Location: [place];  Service: Endoscopy;;   HOT HEMOSTASIS N/A 02/08/2019   Procedure: HOT HEMOSTASIS (ARGON PLASMA COAGULATION/BICAP);  Surgeon: Milus Banister, MD;  Location: [facility] ENDOSCOPY;  Service: Gastroenterology;  Laterality: N/A;   HOT HEMOSTASIS N/A 12/22/2019   Procedure: HOT HEMOSTASIS (ARGON PLASMA COAGULATION/BICAP);  Surgeon: Lavena Bullion, DO;  Location: [facility] ENDOSCOPY;  Service: Gastroenterology;  Laterality: N/A;   HOT HEMOSTASIS N/A 10/19/2020   Procedure: HOT HEMOSTASIS (ARGON PLASMA COAGULATION/BICAP);  Surgeon: Jackquline Denmark, MD;  Location: [facility] ENDOSCOPY;  Service: Endoscopy;  Laterality: N/A;   HOT HEMOSTASIS N/A 12/22/2020  Procedure: HOT HEMOSTASIS (ARGON PLASMA COAGULATION/BICAP);  Surgeon: Gatha Mayer, MD;  Location: [facility] ENDOSCOPY;  Service: Endoscopy;  Laterality: N/A;   HOT HEMOSTASIS N/A 01/09/2021   Procedure: HOT HEMOSTASIS (ARGON PLASMA COAGULATION/BICAP);  Surgeon: Irene Shipper, MD;  Location: [facility] ENDOSCOPY;  Service: Endoscopy;  Laterality: N/A;   HOT HEMOSTASIS N/A 01/25/2021   Procedure: HOT HEMOSTASIS (ARGON PLASMA COAGULATION/BICAP);  Surgeon: Doran Stabler, MD;  Location: [place];  Service: Gastroenterology;  Laterality: N/A;   HOT HEMOSTASIS N/A 02/13/2021   Procedure: HOT HEMOSTASIS (ARGON PLASMA COAGULATION/BICAP);  Surgeon: Jackquline Denmark, MD;  Location: [facility] ENDOSCOPY;  Service: Endoscopy;  Laterality: N/A;   INTERTROCHANTERIC HIP FRACTURE SURGERY Left 11/2006   [name] 03/08/2011   INTRAVASCULAR ULTRASOUND/IVUS N/A 12/19/2019   Procedure:  Intravascular Ultrasound/IVUS;  Surgeon: Jettie Booze, MD;  Location: [place] CV LAB;  Service: Cardiovascular;  Laterality: N/A;   IR FLUORO GUIDE CV LINE RIGHT  07/24/2019   IR FLUORO GUIDE CV LINE RIGHT  07/30/2019   IR US GUIDE VASC ACCESS RIGHT  07/24/2019   IR US GUIDE VASC ACCESS RIGHT  07/30/2019   LAPAROSCOPIC CHOLECYSTECTOMY  05/2006   LIGATION OF COMPETING BRANCHES OF ARTERIOVENOUS FISTULA Left 11/05/2018   Procedure: LIGATION OF COMPETING BRANCHES OF ARTERIOVENOUS FISTULA  LEFT  ARM;  Surgeon: Marty Heck, MD;  Location: [facility] OR;  Service: Vascular;  Laterality: Left;   LUMBAR LAMINECTOMY/DECOMPRESSION MICRODISCECTOMY N/A 02/29/2016   Procedure: Left L4-5 Lateral Recess Decompression, Removal Extradural Intraspinal Facet Cyst;  Surgeon: Marybelle Killings, MD;  Location: [place];  Service: Orthopedics;  Laterality: N/A;   MULTIPLE TOOTH EXTRACTIONS     ORIF MANDIBULAR FRACTURE Left 08/13/2004   ORIF of left body fracture mandible with KLS Martin 2.3-mm six hole/notes 03/08/2011   RIGHT/LEFT HEART CATH AND CORONARY/GRAFT ANGIOGRAPHY N/A 12/19/2019   Procedure: RIGHT/LEFT HEART CATH AND CORONARY/GRAFT ANGIOGRAPHY;  Surgeon: Jettie Booze, MD;  Location: [place] CV LAB;  Service: Cardiovascular;  Laterality: N/A;   SCLEROTHERAPY  12/22/2019   Procedure: SCLEROTHERAPY;  Surgeon: Lavena Bullion, DO;  Location: [facility] ENDOSCOPY;  Service: Gastroenterology;;   Clide Deutscher  02/13/2021   Procedure: Clide Deutscher;  Surgeon: Jackquline Denmark, MD;  Location: [facility] ENDOSCOPY;  Service: Endoscopy;;     reports that he has been smoking cigarettes. He has a 21.50 pack-year smoking history. He has never used smokeless tobacco. He reports previous alcohol use of about 12.0 standard drinks of alcohol per week. He reports previous drug use. Drug: Cocaine.  Allergies  Allergen Reactions   Augmentin [Amoxicillin-Pot Clavulanate] Diarrhea and Other (See Comments)    Severe diarrhea   Mucinex  Fast-Max Other (See Comments)    Intense sweating    Amphetamines Other (See Comments)    Unknown reaction    Family History  Problem Relation Age of Onset   Heart failure Father    Hypertension Father    Diabetes Brother    Heart attack Brother    Alzheimer's disease Mother    Stroke Sister    Diabetes Sister    Alzheimer's disease Sister    Hypertension Brother    Diabetes Brother    Drug abuse Brother    Colon cancer Neg Hx      Prior to Admission medications   Medication Sig Start Date End Date Taking? Authorizing Provider  acetaminophen (TYLENOL) 650 MG CR tablet Take 1,300 mg by mouth 3 (three) times daily.    [provider]  allopurinol (ZYLOPRIM) 100 MG  tablet TAKE 1 TABLET(100 MG) BY MOUTH DAILY 01/12/21   [name], Nelda Bucks, NP  ANORO ELLIPTA 62.5-25 MCG/INH AEPB INHALE 1 PUFF BY MOUTH EVERY DAY 12/21/20   [name], NP  aspirin EC 81 MG tablet Take 81 mg by mouth daily. Swallow whole.    [provider]  atorvastatin (LIPITOR) 10 MG tablet Take 1 tablet (10 mg total) by mouth daily. 03/02/21 05/31/21  Dunn, Nedra Hai, PA-C  B-D UF III MINI PEN NEEDLES 31G X 5 MM MISC USE FOUR TIMES DAILY 03/02/20   [name], NP  bictegravir-emtricitabine-tenofovir AF (BIKTARVY) 50-200-25 MG TABS tablet Take 1 tablet by mouth daily. 06/30/20   [name], MD  Calcium Acetate 667 MG TABS Take [phone] mg by mouth See admin instructions. Take 1,334 mg by mouth three times a day with meals and 667 mg with each snack 01/20/21   [provider]  carvedilol (COREG) 3.125 MG tablet TAKE 1 TABLET(3.125 MG) BY MOUTH TWICE DAILY 04/14/21   [name], MD  colchicine 0.6 MG tablet TAKE 1 TABLET BY MOUTH AS NEEDED FOR GOUT 04/14/21   [name], Dinah C, NP  diclofenac Sodium (VOLTAREN) 1 % GEL APPLY 2 GRAMS TOPICALLY TO THE AFFECTED AREA THREE TIMES DAILY AS NEEDED FOR PAIN 04/14/21   [name], NP  folic acid (FOLVITE) 1 MG tablet Take 1 tablet (1 mg  total) by mouth daily. 01/12/21   [name], Dinah C, NP  gabapentin (NEURONTIN) 300 MG capsule Take 1 capsule (300 mg total) by mouth daily. Dose change due to renal function 12/09/20   [name], NP  glucose blood (ONETOUCH ULTRA) test strip Check blood sugar three times daily. Dx:E11.40 03/02/21   [name], NP  insulin lispro (HUMALOG) 100 UNIT/ML KwikPen INJECT 20 UNITS UNDER THE SKIN THREE TIMES DAILY AS NEEDED FOR HIGH BLOOD SUGAR(ABOVE 150) 02/01/21   [name], NP  isosorbide mononitrate (IMDUR) 30 MG 24 hr tablet TAKE 1 TABLET BY MOUTH EVERY DAY 01/12/21   [name], Dinah C, NP  Lancets (ONETOUCH DELICA PLUS [id number]) MISC Inject 1 Device as directed 3 (three) times daily. Dx: E11.40 09/18/19   [name], NP  latanoprost (XALATAN) 0.005 % ophthalmic solution Place 1 drop into both eyes at bedtime.    [provider]  lidocaine-prilocaine (EMLA) cream Apply 1 application topically every Monday, Wednesday, and Friday with hemodialysis.    [provider]  Methoxy PEG-Epoetin Beta (MIRCERA IJ) Mircera 12/25/20   [provider]  multivitamin (RENA-VIT) TABS tablet Take 1 tablet by mouth daily.    [provider]  nitroGLYCERIN (NITROSTAT) 0.3 MG SL tablet PLACE 1 TABLET UNDER THE TONGUE AS NEEDED FOR CHEST PAIN 04/14/21   [name], MD  pantoprazole (PROTONIX) 40 MG tablet Take 1 tablet (40 mg total) by mouth 2 (two) times daily before a meal. 02/16/21   Thurnell Lose, MD  polyethylene glycol (MIRALAX / GLYCOLAX) 17 g packet Take 17 g by mouth daily as needed for mild constipation. 11/07/20   Aline August, MD  timolol (TIMOPTIC) 0.5 % ophthalmic solution Place 1 drop into both eyes in the morning. 04/04/19   [provider]    Physical Exam: Vitals:   05/05/21 0345 05/05/21 0400 05/05/21 0415 05/05/21 0500  BP: 121/65 (!) 122/53 127/68 116/61  Pulse:   92 87  Resp: 13 10 12 10   Temp:      TempSrc:  SpO2:   98% 100%    Constitutional: NAD, calm, comfortable Eyes: PERRL, lids and conjunctivae normal ENMT: Mucous membranes are moist. Posterior pharynx clear of any exudate or lesions.Normal dentition.  Neck: normal, supple, no masses, no thyromegaly Respiratory: clear to auscultation bilaterally, no wheezing, no crackles. Normal respiratory effort. No accessory muscle use.  Cardiovascular: Regular rate and rhythm, no murmurs / rubs / gallops. No extremity edema. 2+ pedal pulses. No carotid bruits.  Abdomen: no tenderness, no masses palpated. No hepatosplenomegaly. Bowel sounds positive.  Musculoskeletal: no clubbing / cyanosis. No joint deformity upper and lower extremities. Good ROM, no contractures. Normal muscle tone.  Skin: no rashes, lesions, ulcers. No induration Neurologic: CN 2-12 grossly intact. Sensation intact, DTR normal. Strength 5/5 in all 4.  Psychiatric: Normal judgment and insight. Alert and oriented x 3. Normal mood.    Labs on Admission: I have personally reviewed following labs and imaging studies  CBC: Recent Labs  Lab 05/02/21 0743 05/03/21 0046 05/03/21 0101 05/03/21 2135 05/04/21 0734 05/05/21 0308  WBC 5.7 6.5  --  5.5 5.6 5.0  NEUTROABS  --  4.7  --   --   --   --   HGB 9.5* 7.0* 8.5* 7.2* 7.2* 6.7*  HCT 31.0* 22.7* 25.0* 22.2* 21.6* 21.4*  MCV 90.9 92.3  --  89.2 88.9 94.7  PLT 124* 101*  --  80* 79* 84*   Basic Metabolic Panel: Recent Labs  Lab 05/02/21 0743 05/03/21 0046 05/03/21 0101 05/03/21 2135 05/04/21 0734 05/05/21 0308  NA 136 138 138 136 136 137  K 5.0 5.2* 4.8 4.4 5.4* 5.4*  CL 93* 97* 99 98 100 101  CO2 29 21*  --  27 25 22   GLUCOSE 165* 170* 169* 147* 117* 109*  BUN 103* 157* >130* 96* 115* 141*  CREATININE 7.65* 8.80* 9.40* 5.98* 6.96* 8.50*  CALCIUM 8.6* 8.1*  --  8.0* 7.8* 7.9*  MG  --   --   --  2.1 2.1  --   PHOS  --   --   --  4.2 5.3*  --    GFR: Estimated Creatinine Clearance: 8.8 mL/min (A) (by C-G formula  based on SCr of 8.5 mg/dL (H)). Liver Function Tests: Recent Labs  Lab 05/02/21 0743 05/03/21 0046  AST 22 25  ALT 15 15  ALKPHOS 44 31*  BILITOT 0.6 0.7  PROT 6.9 6.0*  ALBUMIN 3.1* 2.8*   Recent Labs  Lab 05/02/21 0743  LIPASE 87*   No results for input(s): AMMONIA in the last 168 hours. Coagulation Profile: Recent Labs  Lab 05/03/21 0128  INR 1.3*   Cardiac Enzymes: No results for input(s): CKTOTAL, CKMB, CKMBINDEX, TROPONINI in the last 168 hours. BNP (last 3 results) No results for input(s): PROBNP in the last 8760 hours. HbA1C: Recent Labs    05/03/21 0046  HGBA1C 5.8*   CBG: Recent Labs  Lab 05/03/21 1502 05/03/21 1924 05/03/21 2310 05/04/21 0321 05/04/21 0729  GLUCAP 138* 160* 114* 98 117*   Lipid Profile: No results for input(s): CHOL, HDL, LDLCALC, TRIG, CHOLHDL, LDLDIRECT in the last 72 hours. Thyroid Function Tests: No results for input(s): TSH, T4TOTAL, FREET4, T3FREE, THYROIDAB in the last 72 hours. Anemia Panel: No results for input(s): VITAMINB12, FOLATE, FERRITIN, TIBC, IRON, RETICCTPCT in the last 72 hours. Urine analysis:    Component Value Date/Time   COLORURINE YELLOW 10/16/2020 2136   APPEARANCEUR CLEAR 10/16/2020 2136   LABSPEC 1.016 10/16/2020 2136   PHURINE 5.0  10/16/2020 2136   GLUCOSEU NEGATIVE 10/16/2020 2136   GLUCOSEU NEG mg/dL 09/20/2010 2058   HGBUR SMALL (A) 10/16/2020 2136   HGBUR negative 05/31/2010 0948   BILIRUBINUR NEGATIVE 10/16/2020 2136   [place] NEGATIVE 10/16/2020 2136   PROTEINUR 30 (A) 10/16/2020 2136   UROBILINOGEN 0.2 06/24/2015 2336   NITRITE NEGATIVE 10/16/2020 2136   LEUKOCYTESUR NEGATIVE 10/16/2020 2136    Radiological Exams on Admission: No results found.  EKG: Independently reviewed.  Assessment/Plan Principal Problem:   GI bleed Active Problems:   Human immunodeficiency virus (HIV) disease (HCC)   Essential hypertension   DM (diabetes mellitus) ([facility])   AVM (arteriovenous  malformation)   ESRD on hemodialysis ([place])   Acute blood loss anemia    Recurrent GIB - Probably AVMs again Will try to localize with CTA AP Message sent to GI for AM consult Protonix IV Tele monitor NPO ABLA - due to GIB 1u PRBC transfusion Stay 2u ahead May need these additional units with dialysis, nephrology aware ESRD - With mild encephalopathy due to uremia Notified nephrology, likely needs dialysis Also a good idea because his platelets not likely functioning with uremia. HTN - Holding home BP meds DM2 - Sensitive SSI Q4H HIV - Cont HAART Thrombocytopenia - Mild, stable Platelet transfusion not indicated at this time  DVT prophylaxis: SCDs Code Status: Full Family Communication: No family in room Disposition Plan: TBD Consults called: Sent message to Dr. Carlean Purl for AM consult Admission status: Admit to inpatient  Severity of Illness: The appropriate patient status for this patient is INPATIENT. Inpatient status is judged to be reasonable and necessary in order to provide the required intensity of service to ensure the patient's safety. The patient's presenting symptoms, physical exam findings, and initial radiographic and laboratory data in the context of their chronic comorbidities is felt to place them at high risk for further clinical deterioration. Furthermore, it is not anticipated that the patient will be medically stable for discharge from the hospital within 2 midnights of admission. The following factors support the patient status of inpatient.   IP status for recurrent GIB with HGB 6.7.   * I certify that at the point of admission it is my clinical judgment that the patient will require inpatient hospital care spanning beyond 2 midnights from the point of admission due to high intensity of service, high risk for further deterioration and high frequency of surveillance required.*   Elle Vezina M. DO [facility]  How to contact the [facility]  Attending or Consulting provider [place] or covering provider during after hours [place], for this patient?  Check the care team in [facility] and look for a) attending/consulting [facility] provider listed and b) the [facility] team listed Log into [email or web address]  Amion Physician Scheduling and messaging for groups and whole hospitals  On call and physician scheduling software for group practices, residents, hospitalists and other medical providers for call, clinic, rotation and shift schedules. On[employer] is a hospital-wide system for scheduling doctors and paging doctors on call. EasyPlot is for scientific plotting and data analysis.  [email or web address]  and use [place]'s universal password to access. If you do not have the password, please contact the hospital operator.  Locate the [facility] provider you are looking for under [facility] and page to a number that you can be directly reached. If you still have difficulty reaching the provider, please page the [facility] (Director on Call) for the Hospitalists listed on amion for assistance.  05/05/2021, 5:47 AM

## 2021-05-05 NOTE — ED Notes (Signed)
Checked patient blood sugar it was 123 notified RN of blood sugar

## 2021-05-05 NOTE — Progress Notes (Signed)
PROGRESS NOTE    Jacob [name]  OZD:[id number] DOB: February 15, 1951 DOA: 05/04/2021 PCP: [name], NP   Brief Narrative: 70 year old with past medical history significant for ESRD on hemodialysis, history of cocaine and alcohol abuse (EtOH not 4 months), HIV on H AA ART, hypertension, diabetes type 2.  Patient with recurrent GI bleed over past several months.  Admitted in April with GI bleed due to bleeding AVM and  Dieulafoy lesion in the duodenal bulb. Patient admitted 2 days ago on 7/11 with GI bleed and hemorrhagic shock.  He also had altered mental status due to uremia.  Patient was dialyzed.  Plan was to perform endoscopy by Dr. Carlean Purl note on 7/11 patient requested to leave on 7/12.   Patient returns 7/13 with recurrent melena hematochezia.  Hemoglobin 6.9.  He has received 2 unit of packed red blood cells during hemodialysis today on 7/13   Assessment & Plan:   Principal Problem:   GI bleed Active Problems:   Human immunodeficiency virus (HIV) disease ([place])   Essential hypertension   DM (diabetes mellitus) ([place])   AVM (arteriovenous malformation)   ESRD on hemodialysis ([place])   Acute blood loss anemia  1-Recurrent GI bleed Patient presented with hematochezia, reported 1 cup of bright red blood -Hb 6.9 on admission -Continue with IV Protonix -CTA  abdomen and pelvis negative for source of bleeding -He received 2 units of packed red blood cells with hemodialysis 7/13 -GI has been consulted, patient will need enteroscopy when confusion improved.    2-ABLA  due to GI bleed: Received 2 unit of packed red blood cell 7/13  3-ESRD: Mild encephalopathy due to uremia Hemodialysis 7/13 Hyperkalemia; correction with HD>   Hypertension: Hold BP meds.   Diabetes type 2: Continue with correction insulin  HIV: Continue with HAART.     Estimated body mass index is 22.52 kg/m as calculated from the following:   Height as of an earlier encounter on 05/04/21: 6\' 2"  (1.88  m).   Weight as of an earlier encounter on 05/04/21: 79.6 kg.   DVT prophylaxis: SCD Code Status: Full Code Family Communication: care discussed with patient.  Disposition Plan:  Status is: Inpatient  Remains inpatient appropriate because:IV treatments appropriate due to intensity of illness or inability to take PO  Dispo: The patient is from: Home              Anticipated d/c is to: Home              Patient currently is not medically stable to d/c.   Difficult to place patient No        Consultants:  GI bleed   Procedures:  HD  Antimicrobials:    Subjective: Patient seen in HD. He report one cup blood in the stool yesterday.   Objective: Vitals:   05/05/21 0830 05/05/21 0836 05/05/21 0840 05/05/21 0900  BP: 120/67 116/61 (!) 119/56 122/60  Pulse: 84 82 77 85  Resp: 18  17 18   Temp: 98.4 F (36.9 C)     TempSrc: Oral     SpO2: 99%      No intake or output data in the 24 hours ending 05/05/21 0918 Filed Weights    Examination:  General exam: Appears calm and comfortable  Respiratory system: Clear to auscultation. Respiratory effort normal. Cardiovascular system: S1 & S2 heard, RRR.  Gastrointestinal system: Abdomen is nondistended, soft and nontender. No organomegaly or masses felt. Normal bowel sounds heard. Central nervous system: Alert  and oriented.  Extremities: Symmetric 5 x 5 power.    Data Reviewed: I have personally reviewed following labs and imaging studies  CBC: Recent Labs  Lab 05/02/21 0743 05/03/21 0046 05/03/21 0101 05/03/21 2135 05/04/21 0734 05/05/21 0308  WBC 5.7 6.5  --  5.5 5.6 5.0  NEUTROABS  --  4.7  --   --   --   --   HGB 9.5* 7.0* 8.5* 7.2* 7.2* 6.7*  HCT 31.0* 22.7* 25.0* 22.2* 21.6* 21.4*  MCV 90.9 92.3  --  89.2 88.9 94.7  PLT 124* 101*  --  80* 79* 84*   Basic Metabolic Panel: Recent Labs  Lab 05/02/21 0743 05/03/21 0046 05/03/21 0101 05/03/21 2135 05/04/21 0734 05/05/21 0308  NA 136 138 138 136 136  137  K 5.0 5.2* 4.8 4.4 5.4* 5.4*  CL 93* 97* 99 98 100 101  CO2 29 21*  --  27 25 22   GLUCOSE 165* 170* 169* 147* 117* 109*  BUN 103* 157* >130* 96* 115* 141*  CREATININE 7.65* 8.80* 9.40* 5.98* 6.96* 8.50*  CALCIUM 8.6* 8.1*  --  8.0* 7.8* 7.9*  MG  --   --   --  2.1 2.1  --   PHOS  --   --   --  4.2 5.3*  --    GFR: Estimated Creatinine Clearance: 8.8 mL/min (A) (by C-G formula based on SCr of 8.5 mg/dL (H)). Liver Function Tests: Recent Labs  Lab 05/02/21 0743 05/03/21 0046  AST 22 25  ALT 15 15  ALKPHOS 44 31*  BILITOT 0.6 0.7  PROT 6.9 6.0*  ALBUMIN 3.1* 2.8*   Recent Labs  Lab 05/02/21 0743  LIPASE 87*   No results for input(s): AMMONIA in the last 168 hours. Coagulation Profile: Recent Labs  Lab 05/03/21 0128 05/05/21 0539  INR 1.3* 1.0   Cardiac Enzymes: No results for input(s): CKTOTAL, CKMB, CKMBINDEX, TROPONINI in the last 168 hours. BNP (last 3 results) No results for input(s): PROBNP in the last 8760 hours. HbA1C: Recent Labs    05/03/21 0046  HGBA1C 5.8*   CBG: Recent Labs  Lab 05/03/21 1924 05/03/21 2310 05/04/21 0321 05/04/21 0729 05/05/21 0730  GLUCAP 160* 114* 98 117* 114*   Lipid Profile: No results for input(s): CHOL, HDL, LDLCALC, TRIG, CHOLHDL, LDLDIRECT in the last 72 hours. Thyroid Function Tests: No results for input(s): TSH, T4TOTAL, FREET4, T3FREE, THYROIDAB in the last 72 hours. Anemia Panel: No results for input(s): VITAMINB12, FOLATE, FERRITIN, TIBC, IRON, RETICCTPCT in the last 72 hours. Sepsis Labs: Recent Labs  Lab 05/03/21 0046  LATICACIDVEN 2.3*    Recent Results (from the past 240 hour(s))  Resp Panel by RT-PCR (Flu A&B, Covid) Nasopharyngeal Swab     Status: None   Collection Time: 05/03/21 12:37 AM   Specimen: Nasopharyngeal Swab; Nasopharyngeal(NP) swabs in vial transport medium  Result Value Ref Range Status   SARS Coronavirus 2 by RT PCR NEGATIVE NEGATIVE Final    Comment: (NOTE) SARS-CoV-2 target  nucleic acids are NOT DETECTED.  The SARS-CoV-2 RNA is generally detectable in upper respiratory specimens during the acute phase of infection. The lowest concentration of SARS-CoV-2 viral copies this assay can detect is 138 copies/mL. A negative result does not preclude SARS-Cov-2 infection and should not be used as the sole basis for treatment or other patient management decisions. A negative result may occur with  improper specimen collection/handling, submission of specimen other than nasopharyngeal swab, presence of viral mutation(s) within the  areas targeted by this assay, and inadequate number of viral copies(<138 copies/mL). A negative result must be combined with clinical observations, patient history, and epidemiological information. The expected result is Negative.  Fact Sheet for Patients:  EntrepreneurPulse.com.au  Fact Sheet for Healthcare Providers:  IncredibleEmployment.be  This test is no t yet approved or cleared by the [place] FDA and  has been authorized for detection and/or diagnosis of SARS-CoV-2 by FDA under an Emergency Use Authorization (EUA). This EUA will remain  in effect (meaning this test can be used) for the duration of the COVID-19 declaration under Section 564(b)(1) of the Act, 21 U.S.C.section 360bbb-3(b)(1), unless the authorization is terminated  or revoked sooner.       Influenza A by PCR NEGATIVE NEGATIVE Final   Influenza B by PCR NEGATIVE NEGATIVE Final    Comment: (NOTE) The Xpert Xpress SARS-CoV-2/FLU/RSV plus assay is intended as an aid in the diagnosis of influenza from Nasopharyngeal swab specimens and should not be used as a sole basis for treatment. Nasal washings and aspirates are unacceptable for Xpert Xpress SARS-CoV-2/FLU/RSV testing.  Fact Sheet for Patients: EntrepreneurPulse.com.au  Fact Sheet for Healthcare  Providers: IncredibleEmployment.be  This test is not yet approved or cleared by the [place] FDA and has been authorized for detection and/or diagnosis of SARS-CoV-2 by FDA under an Emergency Use Authorization (EUA). This EUA will remain in effect (meaning this test can be used) for the duration of the COVID-19 declaration under Section 564(b)(1) of the Act, 21 U.S.C. section 360bbb-3(b)(1), unless the authorization is terminated or revoked.  Performed at [facility], [place] [address], [place], [place] [address]   Blood culture (routine x 2)     Status: None (Preliminary result)   Collection Time: 05/03/21 12:52 AM   Specimen: BLOOD RIGHT FOREARM  Result Value Ref Range Status   Specimen Description BLOOD RIGHT FOREARM  Final   Special Requests   Final    BOTTLES DRAWN AEROBIC AND ANAEROBIC Blood Culture adequate volume   Culture   Final    NO GROWTH 2 DAYS Performed at [facility], [place] [address] [place], [place] [address]    Report Status PENDING  Incomplete  MRSA Next Gen by PCR, Nasal     Status: None   Collection Time: 05/03/21  3:41 AM   Specimen: Nasal Mucosa; Nasal Swab  Result Value Ref Range Status   MRSA by PCR Next Gen NOT DETECTED NOT DETECTED Final    Comment: (NOTE) The GeneXpert MRSA Assay (FDA approved for NASAL specimens only), is one component of a comprehensive MRSA colonization surveillance program. It is not intended to diagnose MRSA infection nor to guide or monitor treatment for MRSA infections. Test performance is not FDA approved in patients less than 51 years old. Performed at [facility], [place] [address], [place], [place] [address]          Radiology Studies: No results found.      Scheduled Meds:  sodium chloride   Intravenous Once   atorvastatin  10 mg Oral Daily   bictegravir-emtricitabine-tenofovir AF  1 tablet Oral Daily   calcium acetate  1,334 mg Oral TID WC   Chlorhexidine Gluconate  Cloth  6 each Topical Q0600   gabapentin  300 mg Oral Daily   insulin aspart  0-9 Units Subcutaneous Q4H   latanoprost  1 drop Both Eyes QHS   multivitamin  1 tablet Oral Daily   pantoprazole (PROTONIX) IV  40  mg Intravenous Q12H   timolol  1 drop Both Eyes q AM   umeclidinium-vilanterol  1 puff Inhalation Daily   Continuous Infusions:  sodium chloride     sodium chloride       LOS: 0 days    Time spent: 35 minutes    Meg Niemeier A Shylo Dillenbeck, MD [facility]   If 7PM-7AM, please contact night-coverage [email or web address]  05/05/2021, 9:18 AM

## 2021-05-06 ENCOUNTER — Other Ambulatory Visit: Payer: Medicare Other

## 2021-05-06 DIAGNOSIS — Q2730 Arteriovenous malformation, site unspecified: Secondary | ICD-10-CM | POA: Diagnosis not present

## 2021-05-06 DIAGNOSIS — K921 Melena: Secondary | ICD-10-CM | POA: Diagnosis not present

## 2021-05-06 LAB — RENAL FUNCTION PANEL
Albumin: 3.1 g/dL — ABNORMAL LOW (ref 3.5–5.0)
Anion gap: 11 (ref 5–15)
BUN: 70 mg/dL — ABNORMAL HIGH (ref 8–23)
CO2: 26 mmol/L (ref 22–32)
Calcium: 7.5 mg/dL — ABNORMAL LOW (ref 8.9–10.3)
Chloride: 98 mmol/L (ref 98–111)
Creatinine, Ser: 6.67 mg/dL — ABNORMAL HIGH (ref 0.61–1.24)
GFR, Estimated: 8 mL/min — ABNORMAL LOW (ref >60)
Glucose, Bld: 132 mg/dL — ABNORMAL HIGH (ref 70–99)
Phosphorus: 5.1 mg/dL — ABNORMAL HIGH (ref 2.5–4.6)
Potassium: 4 mmol/L (ref 3.5–5.1)
Sodium: 135 mmol/L (ref 135–145)

## 2021-05-06 LAB — GLUCOSE, CAPILLARY
Glucose-Capillary: 101 mg/dL — ABNORMAL HIGH (ref 70–99)
Glucose-Capillary: 103 mg/dL — ABNORMAL HIGH (ref 70–99)
Glucose-Capillary: 104 mg/dL — ABNORMAL HIGH (ref 70–99)
Glucose-Capillary: 113 mg/dL — ABNORMAL HIGH (ref 70–99)
Glucose-Capillary: 116 mg/dL — ABNORMAL HIGH (ref 70–99)
Glucose-Capillary: 120 mg/dL — ABNORMAL HIGH (ref 70–99)
Glucose-Capillary: 57 mg/dL — ABNORMAL LOW (ref 70–99)

## 2021-05-06 LAB — CBC
HCT: 23.9 % — ABNORMAL LOW (ref 39.0–52.0)
HCT: 23.8 % — ABNORMAL LOW (ref 39.0–52.0)
Hemoglobin: 8 g/dL — ABNORMAL LOW (ref 13.0–17.0)
Hemoglobin: 7.7 g/dL — ABNORMAL LOW (ref 13.0–17.0)
MCH: 29.5 pg (ref 26.0–34.0)
MCH: 29.5 pg (ref 26.0–34.0)
MCHC: 33.5 g/dL (ref 30.0–36.0)
MCHC: 32.4 g/dL (ref 30.0–36.0)
MCV: 88.2 fL (ref 80.0–100.0)
MCV: 91.2 fL (ref 80.0–100.0)
Platelets: 82 10*3/uL — ABNORMAL LOW (ref 150–400)
Platelets: 81 10*3/uL — ABNORMAL LOW (ref 150–400)
RBC: 2.71 MIL/uL — ABNORMAL LOW (ref 4.22–5.81)
RBC: 2.61 MIL/uL — ABNORMAL LOW (ref 4.22–5.81)
RDW: 18.4 % — ABNORMAL HIGH (ref 11.5–15.5)
RDW: 18.8 % — ABNORMAL HIGH (ref 11.5–15.5)
WBC: 5.1 10*3/uL (ref 4.0–10.5)
WBC: 4.6 10*3/uL (ref 4.0–10.5)
nRBC: 0.4 % — ABNORMAL HIGH (ref 0.0–0.2)
nRBC: 0 % (ref 0.0–0.2)

## 2021-05-06 LAB — BASIC METABOLIC PANEL
Anion gap: 10 (ref 5–15)
BUN: 61 mg/dL — ABNORMAL HIGH (ref 8–23)
CO2: 25 mmol/L (ref 22–32)
Calcium: 7.7 mg/dL — ABNORMAL LOW (ref 8.9–10.3)
Chloride: 99 mmol/L (ref 98–111)
Creatinine, Ser: 5.8 mg/dL — ABNORMAL HIGH (ref 0.61–1.24)
GFR, Estimated: 10 mL/min — ABNORMAL LOW (ref >60)
Glucose, Bld: 108 mg/dL — ABNORMAL HIGH (ref 70–99)
Potassium: 4.3 mmol/L (ref 3.5–5.1)
Sodium: 134 mmol/L — ABNORMAL LOW (ref 135–145)

## 2021-05-06 MED ORDER — OXYCODONE-ACETAMINOPHEN 5-325 MG PO TABS
1.0000 | ORAL_TABLET | Freq: Three times a day (TID) | ORAL | Status: DC | PRN
  Administered 2021-05-06 – 2021-05-07 (×2): 1 via ORAL
  Filled 2021-05-06 (×2): qty 1

## 2021-05-06 NOTE — Plan of Care (Signed)
  Problem: Bowel/Gastric: Goal: Will show no signs and symptoms of gastrointestinal bleeding Outcome: Progressing   Problem: Fluid Volume: Goal: Will show no signs and symptoms of excessive bleeding Outcome: Progressing   Problem: Fluid Volume: Goal: Compliance with measures to maintain balanced fluid volume will improve Outcome: Progressing

## 2021-05-06 NOTE — Progress Notes (Signed)
[facility] Progress Note   Subjective:   seen in room, multiple c/o's , none physical  Objective Vitals:   05/05/21 1700 05/05/21 2109 05/06/21 0452 05/06/21 1036  BP:  (!) 110/50 (!) 114/48 (!) 113/47  Pulse:  79 71 78  Resp:  18 17 16   Temp:  98.3 F (36.8 C) 98 F (36.7 C) 98 F (36.7 C)  TempSrc:  Oral Oral Oral  SpO2:  99% 100% 99%  Weight: 78.4 kg 78.4 kg    Height: 6\' 2"  (1.88 m)      Physical Exam General:chronically ill appearing male in NAD Heart:RRR, no mrg Lungs:CTAB, nml WOB on RA Abdomen:soft, NTND Extremities:no LE edema Dialysis Access: LU AVF in use   Filed Weights   05/05/21 1700 05/05/21 2109  Weight: 78.4 kg 78.4 kg    Intake/Output Summary (Last 24 hours) at 05/06/2021 1412 Last data filed at 05/06/2021 1100 Gross per 24 hour  Intake 220 ml  Output 0 ml  Net 220 ml     Additional Objective Labs: Basic Metabolic Panel: Recent Labs  Lab 05/03/21 2135 05/04/21 0734 05/05/21 0308 05/05/21 2314 05/06/21 1056  NA 136 136 137 134* 135  K 4.4 5.4* 5.4* 4.3 4.0  CL 98 100 101 99 98  CO2 27 25 22 25 26   GLUCOSE 147* 117* 109* 108* 132*  BUN 96* 115* 141* 61* 70*  CREATININE 5.98* 6.96* 8.50* 5.80* 6.67*  CALCIUM 8.0* 7.8* 7.9* 7.7* 7.5*  PHOS 4.2 5.3*  --   --  5.1*    Liver Function Tests: Recent Labs  Lab 05/02/21 0743 05/03/21 0046 05/06/21 1056  AST 22 25  --   ALT 15 15  --   ALKPHOS 44 31*  --   BILITOT 0.6 0.7  --   PROT 6.9 6.0*  --   ALBUMIN 3.1* 2.8* 3.1*    Recent Labs  Lab 05/02/21 0743  LIPASE 87*    CBC: Recent Labs  Lab 05/03/21 0046 05/03/21 0101 05/03/21 2135 05/04/21 0734 05/05/21 0308 05/05/21 1313 05/05/21 2126 05/05/21 2314 05/06/21 1056  WBC 6.5  --  5.5 5.6 5.0  --   --  5.1 4.6  NEUTROABS 4.7  --   --   --   --   --   --   --   --   HGB 7.0*   < > 7.2* 7.2* 6.7*   < > 8.1* 8.0* 7.7*  HCT 22.7*   < > 22.2* 21.6* 21.4*   < > 25.5* 23.9* 23.8*  MCV 92.3  --  89.2 88.9  94.7  --   --  88.2 91.2  PLT 101*  --  80* 79* 84*  --   --  82* 81*   < > = values in this interval not displayed.    Blood Culture    Component Value Date/Time   SDES BLOOD RIGHT FOREARM 05/03/2021 0052   SPECREQUEST  05/03/2021 0052    BOTTLES DRAWN AEROBIC AND ANAEROBIC Blood Culture adequate volume   CULT  05/03/2021 0052    NO GROWTH 3 DAYS Performed at [facility], [place] [address], [place], [place] [address]    REPTSTATUS PENDING 05/03/2021 0052    CBG: Recent Labs  Lab 05/05/21 2109 05/05/21 2344 05/06/21 0452 05/06/21 0757 05/06/21 1201  GLUCAP 139* 104* 103* 116* 120*    Medications:    atorvastatin  10 mg Oral Daily   bictegravir-emtricitabine-tenofovir AF  1 tablet Oral Daily  calcium acetate  1,334 mg Oral TID WC   Chlorhexidine Gluconate Cloth  6 each Topical Q0600   darbepoetin (ARANESP) injection - DIALYSIS  150 mcg Intravenous Q Wed-HD   folic acid  1 mg Oral Daily   gabapentin  300 mg Oral Daily   insulin aspart  0-9 Units Subcutaneous Q4H   latanoprost  1 drop Both Eyes QHS   multivitamin  1 tablet Oral Daily   pantoprazole (PROTONIX) IV  40 mg Intravenous Q12H   thiamine injection  100 mg Intravenous Daily   timolol  1 drop Both Eyes q AM   umeclidinium-vilanterol  1 puff Inhalation Daily    Dialysis Orders: MWF - [facility]  4hrs, BFR 400, DFR AF 1.5,  EDW 76.5 kg, 2K/ 2Ca Hep none  LUA AVF  Mircera 100 mcg q2wks - last 280mcg on 6/22   Assessment/Plan:  Recurrent GIB - known Hx gastric/small bowel AVMs. s/p 2units on 7/13. GI planning enteroscopy tomorrow.  Lethargy/confusion - in setting of acute GIB and uremia, high BUN. Appears improved today.   ESRD - on HD MWF.  HD Friday.   Hypertension/volume  - Blood pressure in goal. Does not appear grossly volume overloaded.UF as tolerated. 2kg up  Anemia of CKD - Hgb drop 7.2>6.7 due to #1. 2 units pRBC ordered. Will order aranesp to be given with HD today.   Secondary Hyperparathyroidism  - Ca and phos in goal.  Not on VDRA. Continue binders, phoslo 2 AC TID and 1 with snacks when eating.  Nutrition - Renal diet w/fluid restrictions when advanced.  DM2 - per PMD Hx CAD s/p CABG x2 (2012) and stent placement in 0100/[id number] systolic/diastolic HF - per PMD Hx COPD Hx HIV   Kelly Splinter  MD 05/06/2021, 2:12 PM

## 2021-05-06 NOTE — H&P (View-Only) (Signed)
   Patient Name: Jacob [name] Date of Encounter: 05/06/2021, 11:07 AM    Subjective  More alert but still confused   Objective  BP (!) 113/47   Pulse 78   Temp 98 F (36.7 C) (Oral)   Resp 16   Ht 6\' 2"  (1.88 m)   Wt 78.4 kg   SpO2 99%   BMI 22.19 kg/m  NAD Lungs cta Cor +s4 Abd benign  CBC Latest Ref Rng & Units 05/05/2021 05/05/2021 05/05/2021  WBC 4.0 - 10.5 K/uL 5.1 - -  Hemoglobin 13.0 - 17.0 g/dL 8.0(L) 8.1(L) 8.3(L)  Hematocrit 39.0 - 52.0 % 23.9(L) 25.5(L) 25.0(L)  Platelets 150 - 400 K/uL 82(L) - -    Lab Results  Component Value Date   INR 1.0 05/05/2021   INR 1.3 (H) 05/03/2021   INR 1.2 02/13/2021      Assessment and Plan  Recurrent GI bleeding with AVMs gastric and small bowel questionable Dieulafoy lesion recently here with recurrent melena and bleeding   Confusion likely metabolic encephalopathy question other   End-stage renal disease   Coronary artery disease with combined systolic and diastolic heart failure   Chronically elevated lipase   Chronic mild thrombocytopenia query liver disease     We had a good conversation where I explained his bleeding issues are chronic and recurrent and he needs a repeat enteroscopy to treat current acute problem and cannot guarantee that will not have more future problems.  The risks and benefits as well as alternatives of endoscopic procedure(s) have been discussed and reviewed. All questions answered. The patient agrees to proceed.  Enteroscopy tomorrow   Gatha Mayer, MD, [facility] 05/06/2021 11:07 AM

## 2021-05-06 NOTE — Progress Notes (Signed)
   Patient Name: Jacob [name] Date of Encounter: 05/06/2021, 11:07 AM    Subjective  More alert but still confused   Objective  BP (!) 113/47   Pulse 78   Temp 98 F (36.7 C) (Oral)   Resp 16   Ht 6\' 2"  (1.88 m)   Wt 78.4 kg   SpO2 99%   BMI 22.19 kg/m  NAD Lungs cta Cor +s4 Abd benign  CBC Latest Ref Rng & Units 05/05/2021 05/05/2021 05/05/2021  WBC 4.0 - 10.5 K/uL 5.1 - -  Hemoglobin 13.0 - 17.0 g/dL 8.0(L) 8.1(L) 8.3(L)  Hematocrit 39.0 - 52.0 % 23.9(L) 25.5(L) 25.0(L)  Platelets 150 - 400 K/uL 82(L) - -    Lab Results  Component Value Date   INR 1.0 05/05/2021   INR 1.3 (H) 05/03/2021   INR 1.2 02/13/2021      Assessment and Plan  Recurrent GI bleeding with AVMs gastric and small bowel questionable Dieulafoy lesion recently here with recurrent melena and bleeding   Confusion likely metabolic encephalopathy question other   End-stage renal disease   Coronary artery disease with combined systolic and diastolic heart failure   Chronically elevated lipase   Chronic mild thrombocytopenia query liver disease     We had a good conversation where I explained his bleeding issues are chronic and recurrent and he needs a repeat enteroscopy to treat current acute problem and cannot guarantee that will not have more future problems.  The risks and benefits as well as alternatives of endoscopic procedure(s) have been discussed and reviewed. All questions answered. The patient agrees to proceed.  Enteroscopy tomorrow   Gatha Mayer, MD, [facility] 05/06/2021 11:07 AM

## 2021-05-06 NOTE — Progress Notes (Signed)
PROGRESS NOTE    Jacob [name]  ESP:[id number] DOB: [birth year] DOA: 05/04/2021 PCP: [name], NP   Brief Narrative: 70 year old with past medical history significant for ESRD on hemodialysis, history of cocaine and alcohol abuse (EtOH not 4 months), HIV on H AA ART, hypertension, diabetes type 2.  Patient with recurrent GI bleed over past several months.  Admitted in April with GI bleed due to bleeding AVM and  Dieulafoy lesion in the duodenal bulb. Patient admitted 2 days ago on 7/11 with GI bleed and hemorrhagic shock.  He also had altered mental status due to uremia.  Patient was dialyzed.  Plan was to perform endoscopy by Dr. Carlean Purl note on 7/11 patient requested to leave on 7/12.   Patient returns 7/13 with recurrent melena hematochezia.  Hemoglobin 6.9.  He has received 2 unit of packed red blood cells during hemodialysis today on 7/13   Assessment & Plan:   Principal Problem:   GI bleed Active Problems:   Human immunodeficiency virus (HIV) disease ([place])   Essential hypertension   DM (diabetes mellitus) ([place])   AVM (arteriovenous malformation)   ESRD on hemodialysis ([place])   Acute blood loss anemia  1-Recurrent GI bleed Patient presented with hematochezia, reported 1 cup of bright red blood -Hb 6.9 on admission -Continue with IV Protonix -CTA  abdomen and pelvis negative for source of bleeding -He received 2 units of packed red blood cells with hemodialysis 7/13 -GI has been consulted, patient will need enteroscopy when confusion improved.  Plan for enteroscopy tomorrow.  Hb 7.7  2-ABLA  due to GI bleed: Received 2 unit of packed red blood cell 7/13 Monitor hb.   3-ESRD: Mild encephalopathy due to uremia Hemodialysis 7/13 Hyperkalemia; correction with HD>   Hypertension: Hold BP meds.   Diabetes type 2: Continue with correction insulin  HIV: Continue with HAART.     Estimated body mass index is 22.19 kg/m as calculated from the following:    Height as of this encounter: 6\' 2"  (1.88 m).   Weight as of this encounter: 78.4 kg.   DVT prophylaxis: SCD Code Status: Full Code Family Communication: care discussed with patient.  Disposition Plan:  Status is: Inpatient  Remains inpatient appropriate because:IV treatments appropriate due to intensity of illness or inability to take PO  Dispo: The patient is from: Home              Anticipated d/c is to: Home              Patient currently is not medically stable to d/c.   Difficult to place patient No        Consultants:  GI bleed   Procedures:  HD  Antimicrobials:    Subjective: No further blood in the stool/  He is alert and oriented times 3 and situation. Appears less confuse today.   Objective: Vitals:   05/05/21 1700 05/05/21 2109 05/06/21 0452 05/06/21 1036  BP:  (!) 110/50 (!) 114/48 (!) 113/47  Pulse:  79 71 78  Resp:  18 17 16   Temp:  98.3 F (36.8 C) 98 F (36.7 C) 98 F (36.7 C)  TempSrc:  Oral Oral Oral  SpO2:  99% 100% 99%  Weight: 78.4 kg 78.4 kg    Height: 6\' 2"  (1.88 m)       Intake/Output Summary (Last 24 hours) at 05/06/2021 1534 Last data filed at 05/06/2021 1332 Gross per 24 hour  Intake 220 ml  Output 0 ml  Net 220 ml   Filed Weights   05/05/21 1700 05/05/21 2109  Weight: 78.4 kg 78.4 kg    Examination:  General exam: NAD Respiratory system: CTA Cardiovascular system: S 1, S 2 RRR Gastrointestinal system: BS present , soft, nt Central nervous system: Alert.  Extremities: Symmetric power   Data Reviewed: I have personally reviewed following labs and imaging studies  CBC: Recent Labs  Lab 05/03/21 0046 05/03/21 0101 05/03/21 2135 05/04/21 0734 05/05/21 0308 05/05/21 1313 05/05/21 1708 05/05/21 2126 05/05/21 2314 05/06/21 1056  WBC 6.5  --  5.5 5.6 5.0  --   --   --  5.1 4.6  NEUTROABS 4.7  --   --   --   --   --   --   --   --   --   HGB 7.0*   < > 7.2* 7.2* 6.7* 7.9* 8.3* 8.1* 8.0* 7.7*  HCT 22.7*   < >  22.2* 21.6* 21.4* 24.5* 25.0* 25.5* 23.9* 23.8*  MCV 92.3  --  89.2 88.9 94.7  --   --   --  88.2 91.2  PLT 101*  --  80* 79* 84*  --   --   --  82* 81*   < > = values in this interval not displayed.    Basic Metabolic Panel: Recent Labs  Lab 05/03/21 2135 05/04/21 0734 05/05/21 0308 05/05/21 2314 05/06/21 1056  NA 136 136 137 134* 135  K 4.4 5.4* 5.4* 4.3 4.0  CL 98 100 101 99 98  CO2 27 25 22 25 26   GLUCOSE 147* 117* 109* 108* 132*  BUN 96* 115* 141* 61* 70*  CREATININE 5.98* 6.96* 8.50* 5.80* 6.67*  CALCIUM 8.0* 7.8* 7.9* 7.7* 7.5*  MG 2.1 2.1  --   --   --   PHOS 4.2 5.3*  --   --  5.1*    GFR: Estimated Creatinine Clearance: 11.1 mL/min (A) (by C-G formula based on SCr of 6.67 mg/dL (H)). Liver Function Tests: Recent Labs  Lab 05/02/21 0743 05/03/21 0046 05/06/21 1056  AST 22 25  --   ALT 15 15  --   ALKPHOS 44 31*  --   BILITOT 0.6 0.7  --   PROT 6.9 6.0*  --   ALBUMIN 3.1* 2.8* 3.1*    Recent Labs  Lab 05/02/21 0743  LIPASE 87*    Recent Labs  Lab 05/05/21 1708  AMMONIA 49*   Coagulation Profile: Recent Labs  Lab 05/03/21 0128 05/05/21 0539  INR 1.3* 1.0    Cardiac Enzymes: No results for input(s): CKTOTAL, CKMB, CKMBINDEX, TROPONINI in the last 168 hours. BNP (last 3 results) No results for input(s): PROBNP in the last 8760 hours. HbA1C: No results for input(s): HGBA1C in the last 72 hours.  CBG: Recent Labs  Lab 05/05/21 2109 05/05/21 2344 05/06/21 0452 05/06/21 0757 05/06/21 1201  GLUCAP 139* 104* 103* 116* 120*    Lipid Profile: No results for input(s): CHOL, HDL, LDLCALC, TRIG, CHOLHDL, LDLDIRECT in the last 72 hours. Thyroid Function Tests: No results for input(s): TSH, T4TOTAL, FREET4, T3FREE, THYROIDAB in the last 72 hours. Anemia Panel: No results for input(s): VITAMINB12, FOLATE, FERRITIN, TIBC, IRON, RETICCTPCT in the last 72 hours. Sepsis Labs: Recent Labs  Lab 05/03/21 0046  LATICACIDVEN 2.3*     Recent  Results (from the past 240 hour(s))  Resp Panel by RT-PCR (Flu A&B, Covid) Nasopharyngeal Swab     Status: None   Collection Time: 05/03/21  12:37 AM   Specimen: Nasopharyngeal Swab; Nasopharyngeal(NP) swabs in vial transport medium  Result Value Ref Range Status   SARS Coronavirus 2 by RT PCR NEGATIVE NEGATIVE Final    Comment: (NOTE) SARS-CoV-2 target nucleic acids are NOT DETECTED.  The SARS-CoV-2 RNA is generally detectable in upper respiratory specimens during the acute phase of infection. The lowest concentration of SARS-CoV-2 viral copies this assay can detect is 138 copies/mL. A negative result does not preclude SARS-Cov-2 infection and should not be used as the sole basis for treatment or other patient management decisions. A negative result may occur with  improper specimen collection/handling, submission of specimen other than nasopharyngeal swab, presence of viral mutation(s) within the areas targeted by this assay, and inadequate number of viral copies(<138 copies/mL). A negative result must be combined with clinical observations, patient history, and epidemiological information. The expected result is Negative.  Fact Sheet for Patients:  EntrepreneurPulse.com.au  Fact Sheet for Healthcare Providers:  IncredibleEmployment.be  This test is no t yet approved or cleared by the [place] FDA and  has been authorized for detection and/or diagnosis of SARS-CoV-2 by FDA under an Emergency Use Authorization (EUA). This EUA will remain  in effect (meaning this test can be used) for the duration of the COVID-19 declaration under Section 564(b)(1) of the Act, 21 U.S.C.section 360bbb-3(b)(1), unless the authorization is terminated  or revoked sooner.       Influenza A by PCR NEGATIVE NEGATIVE Final   Influenza B by PCR NEGATIVE NEGATIVE Final    Comment: (NOTE) The Xpert Xpress SARS-CoV-2/FLU/RSV plus assay is intended as an aid in the  diagnosis of influenza from Nasopharyngeal swab specimens and should not be used as a sole basis for treatment. Nasal washings and aspirates are unacceptable for Xpert Xpress SARS-CoV-2/FLU/RSV testing.  Fact Sheet for Patients: EntrepreneurPulse.com.au  Fact Sheet for Healthcare Providers: IncredibleEmployment.be  This test is not yet approved or cleared by the [place] FDA and has been authorized for detection and/or diagnosis of SARS-CoV-2 by FDA under an Emergency Use Authorization (EUA). This EUA will remain in effect (meaning this test can be used) for the duration of the COVID-19 declaration under Section 564(b)(1) of the Act, 21 U.S.C. section 360bbb-3(b)(1), unless the authorization is terminated or revoked.  Performed at [facility], [place] [address] [place], [place] [address]   Blood culture (routine x 2)     Status: None (Preliminary result)   Collection Time: 05/03/21 12:52 AM   Specimen: BLOOD RIGHT FOREARM  Result Value Ref Range Status   Specimen Description BLOOD RIGHT FOREARM  Final   Special Requests   Final    BOTTLES DRAWN AEROBIC AND ANAEROBIC Blood Culture adequate volume   Culture   Final    NO GROWTH 3 DAYS Performed at [facility], [place] [address], [place], [place] [address]    Report Status PENDING  Incomplete  MRSA Next Gen by PCR, Nasal     Status: None   Collection Time: 05/03/21  3:41 AM   Specimen: Nasal Mucosa; Nasal Swab  Result Value Ref Range Status   MRSA by PCR Next Gen NOT DETECTED NOT DETECTED Final    Comment: (NOTE) The GeneXpert MRSA Assay (FDA approved for NASAL specimens only), is one component of a comprehensive MRSA colonization surveillance program. It is not intended to diagnose MRSA infection nor to guide or monitor treatment for MRSA infections. Test performance is not FDA approved in patients less than 66 years old.  Performed at [facility], [place] [address],  [place], [place] [address]   SARS CORONAVIRUS 2 (TAT 6-24 HRS) Nasopharyngeal Nasopharyngeal Swab     Status: None   Collection Time: 05/05/21  4:02 PM   Specimen: Nasopharyngeal Swab  Result Value Ref Range Status   SARS Coronavirus 2 NEGATIVE NEGATIVE Final    Comment: (NOTE) SARS-CoV-2 target nucleic acids are NOT DETECTED.  The SARS-CoV-2 RNA is generally detectable in upper and lower respiratory specimens during the acute phase of infection. Negative results do not preclude SARS-CoV-2 infection, do not rule out co-infections with other pathogens, and should not be used as the sole basis for treatment or other patient management decisions. Negative results must be combined with clinical observations, patient history, and epidemiological information. The expected result is Negative.  Fact Sheet for Patients: SugarRoll.be  Fact Sheet for Healthcare Providers: https://www.woods-mathews.com/  This test is not yet approved or cleared by the [place] FDA and  has been authorized for detection and/or diagnosis of SARS-CoV-2 by FDA under an Emergency Use Authorization (EUA). This EUA will remain  in effect (meaning this test can be used) for the duration of the COVID-19 declaration under Se ction 564(b)(1) of the Act, 21 U.S.C. section 360bbb-3(b)(1), unless the authorization is terminated or revoked sooner.  Performed at [facility], [place] [address], [place], [place] [address]           Radiology Studies: CT Angio Abd/Pel w/ and/or w/o  Result Date: 05/05/2021 CLINICAL DATA:  anemia. History of same. States he has continued to have bleeding. Recent admission for the same but then left AMA. History of GI bleeds. History of HIV. History of dialysis. In the morning and was sent back to the ER. States he has had black stool and blood visible. States he is feeling little more confused. EXAM: CTA ABDOMEN AND PELVIS WITHOUT AND WITH  CONTRAST TECHNIQUE: Multidetector CT imaging of the abdomen and pelvis was performed using the standard protocol during bolus administration of intravenous contrast. Multiplanar reconstructed images and MIPs were obtained and reviewed to evaluate the vascular anatomy. CONTRAST:  132mL OMNIPAQUE IOHEXOL 350 MG/ML SOLN COMPARISON:  None. FINDINGS: VASCULAR Aorta: Moderate calcified atheromatous plaque. No aneurysm, dissection, or stenosis. Celiac: Partially calcified ostial plaque without significant stenosis, patent distally with unremarkable distal branch anatomy. SMA: Heavily calcified plaque extending from the ostium 4 cm, resulting in at least moderate stenosis, atheromatous but patent distally. Renals: Single bilaterally, both with calcified ostial plaque resulting in stenosis of at indeterminate severity. IMA: Patent without evidence of aneurysm, dissection, vasculitis or significant stenosis. Inflow: Moderate calcified plaque. Mild stenosis in the left common iliac artery. Proximal stenosis in bilateral internal iliac arteries. External iliac arteries patent. Proximal Outflow: Atheromatous, patent Veins: Patent hepatic veins, portal vein, SM V, splenic vein, bilateral renal veins, iliac venous system and IVC. Review of the MIP images confirms the above findings. NON-VASCULAR Lower chest: Sternotomy wires. No pleural or pericardial effusion. Coronary calcifications. Hepatobiliary: No focal liver abnormality is seen. Status post cholecystectomy. No biliary dilatation. Pancreas: Unremarkable. No pancreatic ductal dilatation or surrounding inflammatory changes. Spleen: Normal in size without focal abnormality. Adrenals/Urinary Tract: adrenal glands unremarkable. Small bilateral renal cysts. Calcifications in bilateral renal pelves predominately arterial. No hydronephrosis or ureterectasis. Urinary bladder is incompletely distended. Stomach/Bowel: Stomach is nondistended. Small bowel decompressed. Normal appendix.  The colon is nondilated. A few scattered sigmoid diverticula. No evidence of active extravasation. Lymphatic: No abdominal or pelvic adenopathy. Reproductive: Prostate  is unremarkable. Other: No ascites.  No free air. Musculoskeletal: Spondylitic changes in the lower lumbar spine. IM rod with sliding screw transfix left femoral neck. No acute fracture or worrisome bone lesion. IMPRESSION: 1. No evidence of active GI bleeding. 2. Scattered sigmoid diverticula 3. Coronary and aortoiliac atherosclerosis (ICD10-170.0) involving visceral and renal branches. Electronically Signed   By: Lucrezia Europe M.D.   On: 05/05/2021 14:30        Scheduled Meds:  atorvastatin  10 mg Oral Daily   bictegravir-emtricitabine-tenofovir AF  1 tablet Oral Daily   calcium acetate  1,334 mg Oral TID WC   Chlorhexidine Gluconate Cloth  6 each Topical Q0600   darbepoetin (ARANESP) injection - DIALYSIS  150 mcg Intravenous Q Wed-HD   folic acid  1 mg Oral Daily   gabapentin  300 mg Oral Daily   insulin aspart  0-9 Units Subcutaneous Q4H   latanoprost  1 drop Both Eyes QHS   multivitamin  1 tablet Oral Daily   pantoprazole (PROTONIX) IV  40 mg Intravenous Q12H   thiamine injection  100 mg Intravenous Daily   timolol  1 drop Both Eyes q AM   umeclidinium-vilanterol  1 puff Inhalation Daily   Continuous Infusions:     LOS: 1 day    Time spent: 35 minutes    Britt Theard A Anamarie Hunn, MD [facility]   If 7PM-7AM, please contact night-coverage [email or web address]  05/06/2021, 3:34 PM

## 2021-05-06 NOTE — Plan of Care (Signed)
  Problem: Education: Goal: Ability to identify signs and symptoms of gastrointestinal bleeding will improve Outcome: Progressing   Problem: Bowel/Gastric: Goal: Will show no signs and symptoms of gastrointestinal bleeding Outcome: Progressing

## 2021-05-07 ENCOUNTER — Encounter (HOSPITAL_COMMUNITY)
Admission: EM | Disposition: A | Discharge status: Home / Self Care | Payer: Self-pay | Source: Home / Self Care | Attending: Internal Medicine

## 2021-05-07 ENCOUNTER — Inpatient Hospital Stay (HOSPITAL_COMMUNITY): Payer: Medicare Other | Admitting: Certified Registered"

## 2021-05-07 ENCOUNTER — Encounter (HOSPITAL_COMMUNITY): Payer: Self-pay | Admitting: Internal Medicine

## 2021-05-07 DIAGNOSIS — K3189 Other diseases of stomach and duodenum: Secondary | ICD-10-CM

## 2021-05-07 DIAGNOSIS — K31819 Angiodysplasia of stomach and duodenum without bleeding: Secondary | ICD-10-CM

## 2021-05-07 HISTORY — PX: HOT HEMOSTASIS: SHX5433

## 2021-05-07 HISTORY — PX: ENTEROSCOPY: SHX5533

## 2021-05-07 LAB — RENAL FUNCTION PANEL
Albumin: 3.1 g/dL — ABNORMAL LOW (ref 3.5–5.0)
Anion gap: 10 (ref 5–15)
BUN: 79 mg/dL — ABNORMAL HIGH (ref 8–23)
CO2: 26 mmol/L (ref 22–32)
Calcium: 7.9 mg/dL — ABNORMAL LOW (ref 8.9–10.3)
Chloride: 98 mmol/L (ref 98–111)
Creatinine, Ser: 7.71 mg/dL — ABNORMAL HIGH (ref 0.61–1.24)
GFR, Estimated: 7 mL/min — ABNORMAL LOW (ref >60)
Glucose, Bld: 110 mg/dL — ABNORMAL HIGH (ref 70–99)
Phosphorus: 5.9 mg/dL — ABNORMAL HIGH (ref 2.5–4.6)
Potassium: 4 mmol/L (ref 3.5–5.1)
Sodium: 134 mmol/L — ABNORMAL LOW (ref 135–145)

## 2021-05-07 LAB — GLUCOSE, CAPILLARY
Glucose-Capillary: 123 mg/dL — ABNORMAL HIGH (ref 70–99)
Glucose-Capillary: 158 mg/dL — ABNORMAL HIGH (ref 70–99)
Glucose-Capillary: 71 mg/dL (ref 70–99)
Glucose-Capillary: 82 mg/dL (ref 70–99)
Glucose-Capillary: 86 mg/dL (ref 70–99)
Glucose-Capillary: 94 mg/dL (ref 70–99)

## 2021-05-07 LAB — CBC
HCT: 24.5 % — ABNORMAL LOW (ref 39.0–52.0)
Hemoglobin: 8 g/dL — ABNORMAL LOW (ref 13.0–17.0)
MCH: 30.1 pg (ref 26.0–34.0)
MCHC: 32.7 g/dL (ref 30.0–36.0)
MCV: 92.1 fL (ref 80.0–100.0)
Platelets: 84 10*3/uL — ABNORMAL LOW (ref 150–400)
RBC: 2.66 MIL/uL — ABNORMAL LOW (ref 4.22–5.81)
RDW: 18.8 % — ABNORMAL HIGH (ref 11.5–15.5)
WBC: 4.4 10*3/uL (ref 4.0–10.5)
nRBC: 0 % (ref 0.0–0.2)

## 2021-05-07 LAB — MAGNESIUM: Magnesium: 1.9 mg/dL (ref 1.7–2.4)

## 2021-05-07 SURGERY — ENTEROSCOPY
Anesthesia: Monitor Anesthesia Care

## 2021-05-07 MED ORDER — DICLOFENAC SODIUM 1 % EX GEL
2.0000 g | Freq: Three times a day (TID) | CUTANEOUS | Status: DC
  Administered 2021-05-07: 2 g via TOPICAL
  Filled 2021-05-07: qty 100

## 2021-05-07 MED ORDER — SODIUM CHLORIDE 0.9 % IV SOLN: INTRAVENOUS | Status: DC | PRN

## 2021-05-07 MED ORDER — PHENYLEPHRINE 40 MCG/ML (10ML) SYRINGE FOR IV PUSH (FOR BLOOD PRESSURE SUPPORT)
PREFILLED_SYRINGE | INTRAVENOUS | Status: DC | PRN
  Administered 2021-05-07: 120 ug via INTRAVENOUS

## 2021-05-07 MED ORDER — PROPOFOL 500 MG/50ML IV EMUL
INTRAVENOUS | Status: DC | PRN
  Administered 2021-05-07: 200 ug/kg/min via INTRAVENOUS

## 2021-05-07 NOTE — Op Note (Signed)
[facility] Patient Name: Jacob [name] Procedure Date : 05/07/2021 MRN: [id number] Attending MD: Carlota Raspberry. Havery Moros , MD Date of Birth: May 29, 1951 CSN: [id number] Age: 70 Admit Type: Inpatient Procedure:                Small bowel enteroscopy Indications:              Upper GI bleed - recurrent melena from proximal                            tract AVMs, multiple upper endoscopies in recent                            months with bleeding due to AVMs Providers:                Carlota Raspberry. Havery Moros, MD, Doristine Johns, RN,                            Tyna Jaksch Technician Referring MD:              Medicines:                Monitored Anesthesia Care Complications:            No immediate complications. Estimated blood loss:                            Minimal. Estimated Blood Loss:     Estimated blood loss was minimal. Procedure:                Pre-Anesthesia Assessment:                           - Prior to the procedure, a History and Physical                            was performed, and patient medications and                            allergies were reviewed. The patient's tolerance of                            previous anesthesia was also reviewed. The risks                            and benefits of the procedure and the sedation                            options and risks were discussed with the patient.                            All questions were answered, and informed consent                            was obtained. Prior Anticoagulants: The patient has  taken no previous anticoagulant or antiplatelet                            agents. ASA Grade Assessment: III - A patient with                            severe systemic disease. After reviewing the risks                            and benefits, the patient was deemed in                            satisfactory condition to undergo the procedure.                           After  obtaining informed consent, the endoscope was                            passed under direct vision. Throughout the                            procedure, the patient's blood pressure, pulse, and                            oxygen saturations were monitored continuously. The                            [id number]-[id number] ([id number]) Olympus pediatric colonscope                            was introduced through the mouth and advanced to                            the proximal jejunum. The small bowel enteroscopy                            was accomplished without difficulty. The patient                            tolerated the procedure well. Scope In: Scope Out: Findings:      A small hiatal hernia was present.      The exam of the esophagus was otherwise normal.      15 small to medium sized angiodysplastic lesions with no bleeding were       found in the gastric fundus and in the gastric body. 4 in the gastric       body and rest in proximal fundus. Fulguration to ablate the lesions to       prevent bleeding by argon plasma was successful (APC stomach setting).      The exam of the stomach was otherwise normal.      Four angiodysplastic lesions with no bleeding were found in the proximal       duodenal bulb. Fulguration to ablate the lesions to prevent bleeding by       argon plasma was successful (APC right colon  setting)      Diffusely nodular / polypoid mucosa was found in the duodenal bulb,       previously biopsy and not repeated today.      The exam of the duodenum was otherwise normal.      There was no evidence of significant pathology in the proximal jejunum. Impression:               - Small hiatal hernia.                           - 15 non-bleeding angiodysplastic lesions in the                            stomach. Treated with argon plasma coagulation                            (APC).                           - Four non-bleeding angiodysplastic lesions in the                             duodenum. Treated with argon plasma coagulation                            (APC).                           - Nodular mucosa in the duodenal bulb.                           - The examined portion of the jejunum was normal.                           AVMs the likely culprit for the patient's bleeding.                            No heme noted in the stomach and he has not had                            overt bleeding for 2 days per his report, has                            stopped bleeding Recommendation:           - Return patient to hospital ward for ongoing care.                           - Clear liquid diet tonight, advance tomorrow as                            tolerated.                           - Continue present medications (BID PPI)                           -  Consideration for octreotide depot as outpatient                            to reduce risk of recurrent bleeding given the                            number of AVMs that have been ablated and recurrent                            bleeding episodes he has had. Can follow up with                            Dr. Henrene Pastor as outpatient.                           - I think okay to discharge home tomorrow if stable                            overnight post endoscopic therapy (patient states                            he wants to go home today regardless, will discuss                            with him) Procedure Code(s):        --- Professional ---                           [phone], Small intestinal endoscopy, enteroscopy                            beyond second portion of duodenum, not including                            ileum; with control of bleeding (eg, injection,                            bipolar cautery, unipolar cautery, laser, heater                            probe, stapler, plasma coagulator) Diagnosis Code(s):        --- Professional ---                           K44.9, Diaphragmatic hernia without obstruction or                             gangrene                           K31.819, Angiodysplasia of stomach and duodenum                            without bleeding  K31.89, Other diseases of stomach and duodenum                           K92.1, Melena (includes Hematochezia) CPT copyright 2019 American Medical Association. All rights reserved. The codes documented in this report are preliminary and upon coder review may  be revised to meet current compliance requirements. Remo Lipps P. Roselynne Lortz, MD 05/07/2021 4:20:17 PM This report has been signed electronically. Number of Addenda: 0

## 2021-05-07 NOTE — Progress Notes (Signed)
[facility] Progress Note   Subjective:   no new c/o  Objective Vitals:   05/07/21 1130 05/07/21 1141 05/07/21 1216 05/07/21 1414  BP: 135/84 134/68 (!) 135/57 (!) 152/57  Pulse: 76 79 81 82  Resp: 20 18 18 18   Temp:   97.9 F (36.6 C) 97.9 F (36.6 C)  TempSrc:   Oral Oral  SpO2:   90% 95%  Weight:      Height:       Physical Exam General:chronically ill appearing male in NAD Heart:RRR, no mrg Lungs:CTAB, nml WOB on RA Abdomen:soft, NTND Extremities:no LE edema Dialysis Access: LU AVF in use   Filed Weights   05/05/21 1700 05/05/21 2109 05/07/21 0800  Weight: 78.4 kg 78.4 kg 78.4 kg    Intake/Output Summary (Last 24 hours) at 05/07/2021 1441 Last data filed at 05/07/2021 1234 Gross per 24 hour  Intake 600 ml  Output 1941 ml  Net -1341 ml     Additional Objective Labs: Basic Metabolic Panel: Recent Labs  Lab 05/04/21 0734 05/05/21 0308 05/05/21 2314 05/06/21 1056 05/07/21 0406  NA 136   < > 134* 135 134*  K 5.4*   < > 4.3 4.0 4.0  CL 100   < > 99 98 98  CO2 25   < > 25 26 26   GLUCOSE 117*   < > 108* 132* 110*  BUN 115*   < > 61* 70* 79*  CREATININE 6.96*   < > 5.80* 6.67* 7.71*  CALCIUM 7.8*   < > 7.7* 7.5* 7.9*  PHOS 5.3*  --   --  5.1* 5.9*   < > = values in this interval not displayed.    Liver Function Tests: Recent Labs  Lab 05/02/21 0743 05/03/21 0046 05/06/21 1056 05/07/21 0406  AST 22 25  --   --   ALT 15 15  --   --   ALKPHOS 44 31*  --   --   BILITOT 0.6 0.7  --   --   PROT 6.9 6.0*  --   --   ALBUMIN 3.1* 2.8* 3.1* 3.1*    Recent Labs  Lab 05/02/21 0743  LIPASE 87*    CBC: Recent Labs  Lab 05/03/21 0046 05/03/21 0101 05/04/21 0734 05/05/21 0308 05/05/21 1313 05/05/21 2314 05/06/21 1056 05/07/21 0406  WBC 6.5   < > 5.6 5.0  --  5.1 4.6 4.4  NEUTROABS 4.7  --   --   --   --   --   --   --   HGB 7.0*   < > 7.2* 6.7*   < > 8.0* 7.7* 8.0*  HCT 22.7*   < > 21.6* 21.4*   < > 23.9* 23.8* 24.5*  MCV  92.3   < > 88.9 94.7  --  88.2 91.2 92.1  PLT 101*   < > 79* 84*  --  82* 81* 84*   < > = values in this interval not displayed.    Blood Culture    Component Value Date/Time   SDES BLOOD RIGHT FOREARM 05/03/2021 0052   SPECREQUEST  05/03/2021 0052    BOTTLES DRAWN AEROBIC AND ANAEROBIC Blood Culture adequate volume   CULT  05/03/2021 0052    NO GROWTH 4 DAYS Performed at [facility], [place] [address] [place], [place] [address]    REPTSTATUS PENDING 05/03/2021 0052    CBG: Recent Labs  Lab 05/06/21 1652 05/06/21 1951 05/07/21 0452 05/07/21 0727 05/07/21  1219  GLUCAP 101* 113* 94 82 86    Medications:    [MAR Hold] atorvastatin  10 mg Oral Daily   [MAR Hold] bictegravir-emtricitabine-tenofovir AF  1 tablet Oral Daily   [MAR Hold] calcium acetate  1,334 mg Oral TID WC   [MAR Hold] Chlorhexidine Gluconate Cloth  6 each Topical Q0600   [MAR Hold] darbepoetin (ARANESP) injection - DIALYSIS  150 mcg Intravenous Q Wed-HD   [MAR Hold] folic acid  1 mg Oral Daily   [MAR Hold] gabapentin  300 mg Oral Daily   [MAR Hold] insulin aspart  0-9 Units Subcutaneous Q4H   [MAR Hold] latanoprost  1 drop Both Eyes QHS   [MAR Hold] multivitamin  1 tablet Oral Daily   [MAR Hold] pantoprazole (PROTONIX) IV  40 mg Intravenous Q12H   [MAR Hold] thiamine injection  100 mg Intravenous Daily   [MAR Hold] timolol  1 drop Both Eyes q AM   [MAR Hold] umeclidinium-vilanterol  1 puff Inhalation Daily    Dialysis: MWF [facility]  4h  400/1.5  76.5kg  2/2 bath  Hep none  LUA AVF  - Mircera 100 mcg q2wks - last 267mcg on 6/22   Assessment/Plan:  Recurrent GIB - known Hx gastric/small bowel AVMs. s/p 2units on 7/13. GI planning enteroscopy today. Hb 8.0 Lethargy/confusion - in setting of acute GIB and uremia, high BUN resolved w/ HD here.   ESRD - on HD MWF.  HD today.    Hypertension/volume  - Blood pressure in goal. Does not appear grossly volume overloaded.UF as tolerated. 2kg up  Anemia of CKD  - Hgb drop 7.2>6.7 due to #1. 2 units pRBC ordered. Will order aranesp to be given with HD today.   Secondary Hyperparathyroidism - Ca and phos in goal.  Not on VDRA. Continue binders, phoslo 2 AC TID and 1 with snacks when eating.  Nutrition - Renal diet w/fluid restrictions when advanced.  DM2 - per PMD Hx CAD s/p CABG x2 (2012) and stent placement in 1638/[id number] systolic/diastolic HF - per PMD Hx COPD Hx HIV   Jacob Splinter  MD 05/07/2021, 2:41 PM

## 2021-05-07 NOTE — Transfer of Care (Signed)
Immediate Anesthesia Transfer of Care Note  Patient: Jacob [name]  Procedure(s) Performed: ENTEROSCOPY HOT HEMOSTASIS (ARGON PLASMA COAGULATION/BICAP)  Patient Location: Endoscopy Unit  Anesthesia Type:MAC  Level of Consciousness: responds to stimulation  Airway & Oxygen Therapy: Patient Spontanous Breathing and Patient connected to nasal cannula oxygen  Post-op Assessment: Report given to RN and Post -op Vital signs reviewed and stable  Post vital signs: Reviewed and stable  Last Vitals:  Vitals Value Taken Time  BP    Temp    Pulse    Resp    SpO2      Last Pain:  Vitals:   05/07/21 1414  TempSrc: Oral  PainSc: 6       Patients Stated Pain Goal: 0 (63/33/54 5625)  Complications: No notable events documented.

## 2021-05-07 NOTE — TOC Initial Note (Signed)
Transition of Care [facility]) - Initial/Assessment Note    Patient Details  Name: Jacob [name] MRN: [id number] Date of Birth: 06/25/1951  [facility]) CM/SW Contact:    Verdell Carmine, RN Phone Number: 05/07/2021, 1:18 PM  Clinical Narrative:                 70 YO patient significant hstory of ESRD, cocaine and ETOh abuse HIV, hypertension admitted with GI bleed. Had previous GI bleed recently with AVM and dieulafoy lesion in duodenal bulb. HBG 69WIll need endoscopy RNCM to follow for needs.  Expected Discharge Plan: Home/Self Care Barriers to Discharge: Continued Medical Work up   Patient Goals and CMS Choice        Expected Discharge Plan and Services Expected Discharge Plan: Home/Self Care   Discharge Planning Services: CM Consult Post Acute Care Choice: Dialysis Living arrangements for the past 2 months: Single Family Home                                      Prior Living Arrangements/Services Living arrangements for the past 2 months: Single Family Home Lives with:: Significant Other Patient language and need for interpreter reviewed:: Yes        Need for Family Participation in Patient Care: Yes (Comment) Care giver support system in place?: Yes (comment)   Criminal Activity/Legal Involvement Pertinent to Current Situation/Hospitalization: No - Comment as needed  Activities of Daily Living      Permission Sought/Granted                  Emotional Assessment       Orientation: : Oriented to Self, Oriented to Place, Oriented to  Time, Oriented to Situation Alcohol / Substance Use: Illicit Drugs Psych Involvement: No (comment)  Admission diagnosis:  GI bleed [K92.2] End stage renal disease on dialysis ([place]) [N18.6, Z99.2] Symptomatic anemia [D64.9] Gastrointestinal hemorrhage, unspecified gastrointestinal hemorrhage type [K92.2] Patient Active Problem List   Diagnosis Date Noted   Thrombocytopenia (HCC)    Helicobacter  pylori (H. pylori) infection 04/06/2021   Lumbar foraminal stenosis 03/30/2021   Gastric hemorrhage due to angiodysplasia of stomach    GI bleed 11/24/2020   COVID-19 virus infection 11/24/2020   Other mechanical complication of cranial or spinal infusion catheter, sequela 11/11/2020   Subdural hematoma ([place]) 11/06/2020   Fall at home, initial encounter 11/05/2020   Nicotine dependence, cigarettes, uncomplicated 06/06/4817   Alcohol abuse 11/05/2020   Unspecified trochanteric fracture of left femur, initial encounter for closed fracture ([place]) 11/05/2020   Traumatic subdural hematoma, initial encounter ([place]) 11/05/2020   Fracture of metatarsal of right foot, closed 11/05/2020   Nondisplaced fracture of greater trochanter of left femur, initial encounter for closed fracture ([place])    Uremia    Angiodysplasia of intestine with hemorrhage    Pruritus, unspecified 10/02/2020   Iron deficiency anemia    Acute blood loss anemia    Acute on chronic anemia    Abnormal nuclear stress test 12/19/2019   Type 2 diabetes mellitus with diabetic neuropathy, unspecified ([place]) 08/07/2019   Allergy, unspecified, initial encounter 56/31/4970   Complication of vascular dialysis catheter 08/02/2019   Drug abuse counseling and surveillance of drug abuser 08/02/2019   Other specified coagulation defects ([place]) 08/02/2019   Secondary hyperparathyroidism of renal origin ([place]) 08/02/2019   Unspecified atrial fibrillation ([place]) 08/02/2019   ESRD on hemodialysis ([place])  Cocaine abuse ([place])    CHF exacerbation ([place]) 07/21/2019   Diabetic foot ulcer ([place]) 02/11/2019   AVM (arteriovenous malformation)    Melena 02/07/2019   Symptomatic anemia 02/06/2019   Hyperlipidemia associated with type 2 diabetes mellitus ([place]) 05/30/2018   Right foot ulcer ([place]) 02/28/2018   Chronic obstructive pulmonary disease ([place]) 02/28/2018   Anemia of chronic disease 11/20/2016   CHF (congestive heart failure) ([place]) 11/10/2016    History of lumbar laminectomy for spinal cord decompression 02/29/2016   Type 2 diabetes mellitus with diabetic polyneuropathy, with long-term current use of insulin ([place]) 06/17/2015   HTN (hypertension) 04/26/2015   DM (diabetes mellitus) ([place]) 04/26/2015   Ischemic cardiomyopathy 05/12/2014   Shock ([place]) 09/06/2013   Bunion of left foot 11/25/2011   Bunion, right foot 11/25/2011   Polysubstance abuse ([place]) 07/28/2011   Hip fracture, left ([place]) 04/04/2011   Closed fracture of neck of femur ([place]) 04/04/2011   Insomnia 02/18/2011   Coronary artery disease involving native heart without angina pectoris 04/20/2009   Chronic combined systolic (congestive) and diastolic (congestive) heart failure ([place]) 04/20/2009   Mixed hyperlipidemia 11/20/2006   Gout 11/20/2006   TOBACCO ABUSE 11/20/2006   Essential hypertension 11/20/2006   Human immunodeficiency virus (HIV) disease ([place]) 09/02/2006   PCP:  [name], NP Pharmacy:   [facility] [place], [place] [address] [place] [address] Phone: [phone] Fax: [phone]  [id number] - [place], [place] - [address] [place] [address] Phone: [phone] Fax: [phone]  [facility] - [place], [place] - [address], [address] [place] [address] Phone: [phone] Fax: [phone]  [place] - [address] [place] [address] Phone: [phone] Fax: [phone]     Social Determinants of Health (SDOH) Interventions    Readmission Risk Interventions Readmission Risk Prevention Plan 07/22/2019  Transportation Screening Complete  Medication Review (RN Care Manager) Complete  HRI or [place] Complete  SW Recovery  Care/Counseling Consult Complete  [place] Not Applicable  Some recent data might be hidden

## 2021-05-07 NOTE — Progress Notes (Signed)
PROGRESS NOTE    Jacob [name]  DXA:[id number] DOB: 24-Aug-1951 DOA: 05/04/2021 PCP: [name], NP   Brief Narrative: 70 year old with past medical history significant for ESRD on hemodialysis, history of cocaine and alcohol abuse (EtOH not 4 months), HIV on H AA ART, hypertension, diabetes type 2.  Patient with recurrent GI bleed over past several months.  Admitted in April with GI bleed due to bleeding AVM and  Dieulafoy lesion in the duodenal bulb. Patient admitted 2 days ago on 7/11 with GI bleed and hemorrhagic shock.  He also had altered mental status due to uremia.  Patient was dialyzed.  Plan was to perform endoscopy by Dr. Carlean Purl note on 7/11 patient requested to leave on 7/12.   Patient returns 7/13 with recurrent melena hematochezia.  Hemoglobin 6.9.  He has received 2 unit of packed red blood cells during hemodialysis today on 7/13   Assessment & Plan:   Principal Problem:   GI bleed Active Problems:   Human immunodeficiency virus (HIV) disease ([place])   Essential hypertension   DM (diabetes mellitus) ([place])   AVM (arteriovenous malformation)   ESRD on hemodialysis ([place])   Acute blood loss anemia  1-Recurrent GI bleed Patient presented with hematochezia, reported 1 cup of bright red blood -Hb 6.9 on admission -Continue with IV Protonix -CTA  abdomen and pelvis negative for source of bleeding -He received 2 units of packed red blood cells with hemodialysis 7/13 -GI has been consulted, patient will need enteroscopy when confusion improved.  Plan for enteroscopy today.  Hb 8.0  2-ABLA  due to GI bleed: Received 2 unit of packed red blood cell 7/13 Monitor hb.   3-ESRD: Mild encephalopathy due to uremia Hemodialysis 7/13 Hyperkalemia; correction with HD>   Hypertension: Hold BP meds.   Diabetes type 2: Continue with correction insulin  HIV: Continue with HAART.     Estimated body mass index is 22.19 kg/m as calculated from the following:   Height  as of this encounter: 6\' 2"  (1.88 m).   Weight as of this encounter: 78.4 kg.   DVT prophylaxis: SCD Code Status: Full Code Family Communication: care discussed with patient.  Disposition Plan:  Status is: Inpatient  Remains inpatient appropriate because:IV treatments appropriate due to intensity of illness or inability to take PO  Dispo: The patient is from: Home              Anticipated d/c is to: Home              Patient currently is not medically stable to d/c.   Difficult to place patient No        Consultants:  GI bleed   Procedures:  HD  Antimicrobials:    Subjective: Seem in HD> no new complaints.    Objective: Vitals:   05/07/21 1000 05/07/21 1030 05/07/21 1100 05/07/21 1216  BP: 139/64 139/69 135/65 (!) 135/57  Pulse: 76 81 77 81  Resp: 10 10 (!) 21 18  Temp:    97.9 F (36.6 C)  TempSrc:    Oral  SpO2:    90%  Weight:      Height:        Intake/Output Summary (Last 24 hours) at 05/07/2021 1301 Last data filed at 05/07/2021 1234 Gross per 24 hour  Intake 600 ml  Output --  Net 600 ml    Filed Weights   05/05/21 1700 05/05/21 2109 05/07/21 0800  Weight: 78.4 kg 78.4 kg 78.4 kg  Examination:  General exam: NAD Respiratory system: CTA Cardiovascular system: S 1, S 2 RRR Gastrointestinal system: BS present, soft, nt Central nervous system: Alert Extremities: Symmetric power   Data Reviewed: I have personally reviewed following labs and imaging studies  CBC: Recent Labs  Lab 05/03/21 0046 05/03/21 0101 05/04/21 0734 05/05/21 0308 05/05/21 1313 05/05/21 1708 05/05/21 2126 05/05/21 2314 05/06/21 1056 05/07/21 0406  WBC 6.5   < > 5.6 5.0  --   --   --  5.1 4.6 4.4  NEUTROABS 4.7  --   --   --   --   --   --   --   --   --   HGB 7.0*   < > 7.2* 6.7*   < > 8.3* 8.1* 8.0* 7.7* 8.0*  HCT 22.7*   < > 21.6* 21.4*   < > 25.0* 25.5* 23.9* 23.8* 24.5*  MCV 92.3   < > 88.9 94.7  --   --   --  88.2 91.2 92.1  PLT 101*   < > 79* 84*   --   --   --  82* 81* 84*   < > = values in this interval not displayed.    Basic Metabolic Panel: Recent Labs  Lab 05/03/21 2135 05/04/21 0734 05/05/21 0308 05/05/21 2314 05/06/21 1056 05/07/21 0406  NA 136 136 137 134* 135 134*  K 4.4 5.4* 5.4* 4.3 4.0 4.0  CL 98 100 101 99 98 98  CO2 27 25 22 25 26 26   GLUCOSE 147* 117* 109* 108* 132* 110*  BUN 96* 115* 141* 61* 70* 79*  CREATININE 5.98* 6.96* 8.50* 5.80* 6.67* 7.71*  CALCIUM 8.0* 7.8* 7.9* 7.7* 7.5* 7.9*  MG 2.1 2.1  --   --   --  1.9  PHOS 4.2 5.3*  --   --  5.1* 5.9*    GFR: Estimated Creatinine Clearance: 9.6 mL/min (A) (by C-G formula based on SCr of 7.71 mg/dL (H)). Liver Function Tests: Recent Labs  Lab 05/02/21 0743 05/03/21 0046 05/06/21 1056 05/07/21 0406  AST 22 25  --   --   ALT 15 15  --   --   ALKPHOS 44 31*  --   --   BILITOT 0.6 0.7  --   --   PROT 6.9 6.0*  --   --   ALBUMIN 3.1* 2.8* 3.1* 3.1*    Recent Labs  Lab 05/02/21 0743  LIPASE 87*    Recent Labs  Lab 05/05/21 1708  AMMONIA 49*    Coagulation Profile: Recent Labs  Lab 05/03/21 0128 05/05/21 0539  INR 1.3* 1.0    Cardiac Enzymes: No results for input(s): CKTOTAL, CKMB, CKMBINDEX, TROPONINI in the last 168 hours. BNP (last 3 results) No results for input(s): PROBNP in the last 8760 hours. HbA1C: No results for input(s): HGBA1C in the last 72 hours.  CBG: Recent Labs  Lab 05/06/21 1652 05/06/21 1951 05/07/21 0452 05/07/21 0727 05/07/21 1219  GLUCAP 101* 113* 94 82 86    Lipid Profile: No results for input(s): CHOL, HDL, LDLCALC, TRIG, CHOLHDL, LDLDIRECT in the last 72 hours. Thyroid Function Tests: No results for input(s): TSH, T4TOTAL, FREET4, T3FREE, THYROIDAB in the last 72 hours. Anemia Panel: No results for input(s): VITAMINB12, FOLATE, FERRITIN, TIBC, IRON, RETICCTPCT in the last 72 hours. Sepsis Labs: Recent Labs  Lab 05/03/21 0046  LATICACIDVEN 2.3*     Recent Results (from the past 240  hour(s))  Resp Panel by RT-PCR (  Flu A&B, Covid) Nasopharyngeal Swab     Status: None   Collection Time: 05/03/21 12:37 AM   Specimen: Nasopharyngeal Swab; Nasopharyngeal(NP) swabs in vial transport medium  Result Value Ref Range Status   SARS Coronavirus 2 by RT PCR NEGATIVE NEGATIVE Final    Comment: (NOTE) SARS-CoV-2 target nucleic acids are NOT DETECTED.  The SARS-CoV-2 RNA is generally detectable in upper respiratory specimens during the acute phase of infection. The lowest concentration of SARS-CoV-2 viral copies this assay can detect is 138 copies/mL. A negative result does not preclude SARS-Cov-2 infection and should not be used as the sole basis for treatment or other patient management decisions. A negative result may occur with  improper specimen collection/handling, submission of specimen other than nasopharyngeal swab, presence of viral mutation(s) within the areas targeted by this assay, and inadequate number of viral copies(<138 copies/mL). A negative result must be combined with clinical observations, patient history, and epidemiological information. The expected result is Negative.  Fact Sheet for Patients:  EntrepreneurPulse.com.au  Fact Sheet for Healthcare Providers:  IncredibleEmployment.be  This test is no t yet approved or cleared by the [place] FDA and  has been authorized for detection and/or diagnosis of SARS-CoV-2 by FDA under an Emergency Use Authorization (EUA). This EUA will remain  in effect (meaning this test can be used) for the duration of the COVID-19 declaration under Section 564(b)(1) of the Act, 21 U.S.C.section 360bbb-3(b)(1), unless the authorization is terminated  or revoked sooner.       Influenza A by PCR NEGATIVE NEGATIVE Final   Influenza B by PCR NEGATIVE NEGATIVE Final    Comment: (NOTE) The Xpert Xpress SARS-CoV-2/FLU/RSV plus assay is intended as an aid in the diagnosis of influenza from  Nasopharyngeal swab specimens and should not be used as a sole basis for treatment. Nasal washings and aspirates are unacceptable for Xpert Xpress SARS-CoV-2/FLU/RSV testing.  Fact Sheet for Patients: EntrepreneurPulse.com.au  Fact Sheet for Healthcare Providers: IncredibleEmployment.be  This test is not yet approved or cleared by the [place] FDA and has been authorized for detection and/or diagnosis of SARS-CoV-2 by FDA under an Emergency Use Authorization (EUA). This EUA will remain in effect (meaning this test can be used) for the duration of the COVID-19 declaration under Section 564(b)(1) of the Act, 21 U.S.C. section 360bbb-3(b)(1), unless the authorization is terminated or revoked.  Performed at [facility], [place] [address] [place], [place] [address]   Blood culture (routine x 2)     Status: None (Preliminary result)   Collection Time: 05/03/21 12:52 AM   Specimen: BLOOD RIGHT FOREARM  Result Value Ref Range Status   Specimen Description BLOOD RIGHT FOREARM  Final   Special Requests   Final    BOTTLES DRAWN AEROBIC AND ANAEROBIC Blood Culture adequate volume   Culture   Final    NO GROWTH 4 DAYS Performed at [facility], [place] [address], [place], [place] [address]    Report Status PENDING  Incomplete  MRSA Next Gen by PCR, Nasal     Status: None   Collection Time: 05/03/21  3:41 AM   Specimen: Nasal Mucosa; Nasal Swab  Result Value Ref Range Status   MRSA by PCR Next Gen NOT DETECTED NOT DETECTED Final    Comment: (NOTE) The GeneXpert MRSA Assay (FDA approved for NASAL specimens only), is one component of a comprehensive MRSA colonization surveillance program. It is not intended to diagnose MRSA infection nor to guide or monitor treatment  for MRSA infections. Test performance is not FDA approved in patients less than 53 years old. Performed at [facility], [place] [address], [place], [place] [address]    SARS CORONAVIRUS 2 (TAT 6-24 HRS) Nasopharyngeal Nasopharyngeal Swab     Status: None   Collection Time: 05/05/21  4:02 PM   Specimen: Nasopharyngeal Swab  Result Value Ref Range Status   SARS Coronavirus 2 NEGATIVE NEGATIVE Final    Comment: (NOTE) SARS-CoV-2 target nucleic acids are NOT DETECTED.  The SARS-CoV-2 RNA is generally detectable in upper and lower respiratory specimens during the acute phase of infection. Negative results do not preclude SARS-CoV-2 infection, do not rule out co-infections with other pathogens, and should not be used as the sole basis for treatment or other patient management decisions. Negative results must be combined with clinical observations, patient history, and epidemiological information. The expected result is Negative.  Fact Sheet for Patients: SugarRoll.be  Fact Sheet for Healthcare Providers: https://www.woods-mathews.com/  This test is not yet approved or cleared by the [place] FDA and  has been authorized for detection and/or diagnosis of SARS-CoV-2 by FDA under an Emergency Use Authorization (EUA). This EUA will remain  in effect (meaning this test can be used) for the duration of the COVID-19 declaration under Se ction 564(b)(1) of the Act, 21 U.S.C. section 360bbb-3(b)(1), unless the authorization is terminated or revoked sooner.  Performed at [facility], [place] [address] [place], [place] [address]           Radiology Studies: CT Angio Abd/Pel w/ and/or w/o  Result Date: 05/05/2021 CLINICAL DATA:  anemia. History of same. States he has continued to have bleeding. Recent admission for the same but then left AMA. History of GI bleeds. History of HIV. History of dialysis. In the morning and was sent back to the ER. States he has had black stool and blood visible. States he is feeling little more confused. EXAM: CTA ABDOMEN AND PELVIS WITHOUT AND WITH CONTRAST TECHNIQUE:  Multidetector CT imaging of the abdomen and pelvis was performed using the standard protocol during bolus administration of intravenous contrast. Multiplanar reconstructed images and MIPs were obtained and reviewed to evaluate the vascular anatomy. CONTRAST:  176mL OMNIPAQUE IOHEXOL 350 MG/ML SOLN COMPARISON:  None. FINDINGS: VASCULAR Aorta: Moderate calcified atheromatous plaque. No aneurysm, dissection, or stenosis. Celiac: Partially calcified ostial plaque without significant stenosis, patent distally with unremarkable distal branch anatomy. SMA: Heavily calcified plaque extending from the ostium 4 cm, resulting in at least moderate stenosis, atheromatous but patent distally. Renals: Single bilaterally, both with calcified ostial plaque resulting in stenosis of at indeterminate severity. IMA: Patent without evidence of aneurysm, dissection, vasculitis or significant stenosis. Inflow: Moderate calcified plaque. Mild stenosis in the left common iliac artery. Proximal stenosis in bilateral internal iliac arteries. External iliac arteries patent. Proximal Outflow: Atheromatous, patent Veins: Patent hepatic veins, portal vein, SM V, splenic vein, bilateral renal veins, iliac venous system and IVC. Review of the MIP images confirms the above findings. NON-VASCULAR Lower chest: Sternotomy wires. No pleural or pericardial effusion. Coronary calcifications. Hepatobiliary: No focal liver abnormality is seen. Status post cholecystectomy. No biliary dilatation. Pancreas: Unremarkable. No pancreatic ductal dilatation or surrounding inflammatory changes. Spleen: Normal in size without focal abnormality. Adrenals/Urinary Tract: adrenal glands unremarkable. Small bilateral renal cysts. Calcifications in bilateral renal pelves predominately arterial. No hydronephrosis or ureterectasis. Urinary bladder is incompletely distended. Stomach/Bowel: Stomach is nondistended. Small bowel decompressed. Normal appendix. The colon is  nondilated. A few  scattered sigmoid diverticula. No evidence of active extravasation. Lymphatic: No abdominal or pelvic adenopathy. Reproductive: Prostate is unremarkable. Other: No ascites.  No free air. Musculoskeletal: Spondylitic changes in the lower lumbar spine. IM rod with sliding screw transfix left femoral neck. No acute fracture or worrisome bone lesion. IMPRESSION: 1. No evidence of active GI bleeding. 2. Scattered sigmoid diverticula 3. Coronary and aortoiliac atherosclerosis (ICD10-170.0) involving visceral and renal branches. Electronically Signed   By: Lucrezia Europe M.D.   On: 05/05/2021 14:30        Scheduled Meds:  atorvastatin  10 mg Oral Daily   bictegravir-emtricitabine-tenofovir AF  1 tablet Oral Daily   calcium acetate  1,334 mg Oral TID WC   Chlorhexidine Gluconate Cloth  6 each Topical Q0600   darbepoetin (ARANESP) injection - DIALYSIS  150 mcg Intravenous Q Wed-HD   folic acid  1 mg Oral Daily   gabapentin  300 mg Oral Daily   insulin aspart  0-9 Units Subcutaneous Q4H   latanoprost  1 drop Both Eyes QHS   multivitamin  1 tablet Oral Daily   pantoprazole (PROTONIX) IV  40 mg Intravenous Q12H   thiamine injection  100 mg Intravenous Daily   timolol  1 drop Both Eyes q AM   umeclidinium-vilanterol  1 puff Inhalation Daily   Continuous Infusions:     LOS: 2 days    Time spent: 35 minutes    Ninnie Fein A Roniyah Llorens, MD [facility]   If 7PM-7AM, please contact night-coverage [email or web address]  05/07/2021, 1:01 PM

## 2021-05-07 NOTE — Plan of Care (Signed)
  Problem: Education: Goal: Ability to identify signs and symptoms of gastrointestinal bleeding will improve Outcome: Progressing   Problem: Clinical Measurements: Goal: Complications related to the disease process, condition or treatment will be avoided or minimized Outcome: Progressing   Problem: Education: Goal: Knowledge of disease and its progression will improve Outcome: Progressing

## 2021-05-07 NOTE — Progress Notes (Signed)
PT Cancellation Note  Patient Details Name: Jacob [name] MRN: [id number] DOB: 12-11-1950   Cancelled Treatment:    Reason Eval/Treat Not Completed: (P) Patient at procedure or test/unavailable Pt off floor for HD. PT will follow back for evaluation this afternoon as able.  [name] [name] PT, DPT Acute Rehabilitation Services Pager [phone] Office [phone]    [place] 05/07/2021, 9:53 AM

## 2021-05-07 NOTE — Progress Notes (Signed)
Notified by telemetry patient run 8 beats of Jacob [name] provider MD [place] made aware. See new order

## 2021-05-07 NOTE — Consult Note (Signed)
   [facility] Inpatient Consult   05/07/2021  Jacob [name] 04/23/1951 [id number]  [place] [facility] [ACO] Patient: [name]  Primary Care Provider:  [name], NP, [facility] does the [facility] follow up  Patient assessed for extreme high risk scores for unplanned readmissions.   Spoke with the patient regarding post hospital follow up needs.  Patient endorses [name], NP. Patient states he goes to dialysis MWF which keeps a check on my weight and has calls "all the time from [place]". Patient denies any other follow up needed states, "I just want to find out about my test and go home and get in my own bed."  States  "I just got Medicaid a few months ago so I am good just want to go home."  Plan: Patient denies services needed noted in a [facility] program.  For questions, please contact:  Natividad Brood, RN BSN [facility] Liaison  [phone] business mobile phone Toll free office [phone]  Fax number: [phone] [email or web address]

## 2021-05-07 NOTE — Anesthesia Postprocedure Evaluation (Signed)
Anesthesia Post Note  Patient: Jacob [name]  Procedure(s) Performed: ENTEROSCOPY HOT HEMOSTASIS (ARGON PLASMA COAGULATION/BICAP)     Patient location during evaluation: Endoscopy Anesthesia Type: MAC Level of consciousness: awake and alert Pain management: pain level controlled Vital Signs Assessment: post-procedure vital signs reviewed and stable Respiratory status: spontaneous breathing, nonlabored ventilation, respiratory function stable and patient connected to nasal cannula oxygen Cardiovascular status: blood pressure returned to baseline and stable Postop Assessment: no apparent nausea or vomiting Anesthetic complications: no   No notable events documented.  Last Vitals:  Vitals:   05/07/21 1620 05/07/21 1632  BP: (!) 110/33 (!) 137/48  Pulse: 64 68  Resp: 15 17  Temp:    SpO2: 100% 98%    Last Pain:  Vitals:   05/07/21 1614  TempSrc:   PainSc: Asleep                 Yalexa Blust L Girtha Kilgore

## 2021-05-07 NOTE — Anesthesia Preprocedure Evaluation (Addendum)
Anesthesia Evaluation  Patient identified by MRN, date of birth, ID band Patient awake    Reviewed: Allergy & Precautions, NPO status , Patient's Chart, lab work & pertinent test results, reviewed documented beta blocker date and time   Airway Mallampati: I  TM Distance: >3 FB Neck ROM: Full    Dental  (+) Poor Dentition, Missing, Chipped, Dental Advisory Given,    Pulmonary COPD, Current Smoker and Patient abstained from smoking.,    Pulmonary exam normal breath sounds clear to auscultation       Cardiovascular hypertension, Pt. on medications and Pt. on home beta blockers + CAD, + Past MI, + Cardiac Stents and +CHF  Normal cardiovascular exam Rhythm:Regular Rate:Normal  TTE 2021 1. Left ventricular ejection fraction, by estimation, is 40 to 45%. The  left ventricle has mildly decreased function. The left ventricle  demonstrates regional wall motion abnormalities (see scoring  diagram/findings for description). The left ventricular  internal cavity size was moderately dilated. There is moderate concentric  left ventricular hypertrophy. Left ventricular diastolic parameters are  consistent with Grade II diastolic dysfunction (pseudonormalization).  Elevated left atrial pressure.  2. Right ventricular systolic function is normal. The right ventricular  size is normal. There is normal pulmonary artery systolic pressure.  3. Left atrial size was moderately dilated.  4. The mitral valve is normal in structure. Mild mitral valve  regurgitation. No evidence of mitral stenosis.  5. The aortic valve is normal in structure. Aortic valve regurgitation is  not visualized. No aortic stenosis is present.  6. The inferior vena cava is dilated in size with >50% respiratory  variability, suggesting right atrial pressure of 8 mmHg.   [place] 2021 Prox RCA lesion is 100% stenosed. SVG to RCA is widely patent. Dist LAD lesion is 90% stenosed.  Too small and distal for PCI. 3rd Diag lesion is 50% stenosed. Prox Cx lesion is 100% stenosed. Left to left collaterals. Ramus lesion is 100% stenosed. SVG to ramus with Origin to Prox Graft lesion is 95% stenosed. A drug-eluting stent was successfully placed using a SYNERGY XD [id number], postdilated to >4 mm. Optimized with IVUS. Post intervention, there is a 0% residual stenosis. There is moderate to severe left ventricular systolic dysfunction. There is no aortic valve stenosis. Hemodynamic findings consistent with moderate pulmonary hypertension.   Continue dual antiplatelet therapy for 6 months at least.  COnsider clopidogrel longer term if there is no interaction with his HIV meds, given the diffuse nature of CAD    Neuro/Psych negative neurological ROS  negative psych ROS   GI/Hepatic GERD  Controlled and Medicated,(+)     substance abuse  cocaine use, Hepatitis -, B  Endo/Other  negative endocrine ROSdiabetes, Type 2, Insulin Dependent  Renal/GU ESRF and DialysisRenal disease (Cr 7.71, K 4.0, dialysis MWF, had dialysis today without complications)  negative genitourinary   Musculoskeletal  (+) Arthritis ,   Abdominal   Peds  Hematology  (+) Blood dyscrasia (Hgb 8.0, plt 84), anemia , HIV,   Anesthesia Other Findings   Reproductive/Obstetrics                            Anesthesia Physical Anesthesia Plan  ASA: 4  Anesthesia Plan: MAC   Post-op Pain Management:    Induction: Intravenous  PONV Risk Score and Plan: Propofol infusion and Treatment may vary due to age or medical condition  Airway Management Planned: Natural Airway  Additional Equipment:   Intra-op  Plan:   Post-operative Plan:   Informed Consent: I have reviewed the patients History and Physical, chart, labs and discussed the procedure including the risks, benefits and alternatives for the proposed anesthesia with the patient or authorized representative who has  indicated his/her understanding and acceptance.     Dental advisory given  Plan Discussed with: CRNA  Anesthesia Plan Comments:         Anesthesia Quick Evaluation

## 2021-05-07 NOTE — Anesthesia Procedure Notes (Signed)
Procedure Name: MAC Date/Time: 05/07/2021 3:27 PM Performed by: Babs Bertin, CRNA Pre-anesthesia Checklist: Patient identified, Emergency Drugs available, Suction available, Patient being monitored and Timeout performed Patient Re-evaluated:Patient Re-evaluated prior to induction Oxygen Delivery Method: Nasal cannula

## 2021-05-07 NOTE — Plan of Care (Signed)
  Problem: Bowel/Gastric: Goal: Will show no signs and symptoms of gastrointestinal bleeding Outcome: Progressing   

## 2021-05-07 NOTE — Interval H&P Note (Signed)
History and Physical Interval Note: Patient states he did not have any bleeding symptoms overnight. He is quite frustrated that he is still in the hospital and wants to go home, he is angry that he has recurrent bleeding. History and chart reviewed, multiple endoscopies this year for recurrent upper GI Bleeding, has had numerous AVMs treated in the stomach and duodenum. He likely has recurrence of the same. Hgb stable today. We discussed role of enteroscopy to re-evaluate his upper tract and treat underlying pathology which could be causing this. Risks include bleeding, perforation, etc, and adverse reaction to anesthesia, etc. He states he understands and wishes to proceed. Further recommendations pending the results. All questions answered. Exam unchanged.    05/07/2021 2:35 PM  Jacob [name]  has presented today for surgery, with the diagnosis of melena, avm;s.  The various methods of treatment have been discussed with the patient and family. After consideration of risks, benefits and other options for treatment, the patient has consented to  Procedure(s): ENTEROSCOPY (N/A) as a surgical intervention.  The patient's history has been reviewed, patient examined, no change in status, stable for surgery.  I have reviewed the patient's chart and labs.  Questions were answered to the patient's satisfaction.     Bruni

## 2021-05-07 NOTE — Evaluation (Signed)
Physical Therapy Evaluation Patient Details Name: Jacob [name] MRN: [id number] DOB: 05-31-1951 Today's Date: 05/07/2021   History of Present Illness  Pt is a 70 y.o. male admitted 05/04/21 with recurrent GIB over past several months. Plan for enteroscopy 7/15. Pt with mild encephalopathy due to uremia (last HD 7/13). PMH includes GIB (recent admission 02/12/21), ESRD (on HD), HTN, DM2, HIV, COPD, CAD.  Clinical Impression  Pt known to therapist from previous admissions. Pt sitting on bed on entry holding his R foot in his hans. Pt expresses extreme frustration that he continues to have GIB and wants it to be fixed "so I can get on with my life." PTA pt living in 2nd floor apartment with flight of steps to enter. Pt reports independence with mobility, and ADLs. Pt mobility has improved since April. Pt is currently mod I for transfers and supervision for hallway ambulation with SPC. Pt will not have additional PT needs at discharge, however PT will continue to follow acutely to progress mobility.     Follow Up Recommendations No PT follow up    Equipment Recommendations  None recommended by PT       Precautions / Restrictions Precautions Precautions: None Restrictions Weight Bearing Restrictions: No      Mobility  Bed Mobility               General bed mobility comments: sitting EoB on entry    Transfers Overall transfer level: Modified independent Equipment used: Straight cane             General transfer comment: pt request to don shoes prior to getting up, good power up and self steady with cane  Ambulation/Gait Ambulation/Gait assistance: Supervision Gait Distance (Feet): 80 Feet Assistive device: Straight cane Gait Pattern/deviations: Step-through pattern Gait velocity: slowed Gait velocity interpretation: <1.8 ft/sec, indicate of risk for recurrent falls General Gait Details: supervision for safety, pt with inversion of R foot with placement with  weightbearing rolls back into neutral     Balance Overall balance assessment: Mild deficits observed, not formally tested                                           Pertinent Vitals/Pain Pain Assessment: Faces Faces Pain Scale: Hurts a little bit Pain Location: R foot where callus was removed Pain Descriptors / Indicators: Throbbing;Tender Pain Intervention(s): Limited activity within patient's tolerance;Monitored during session;Patient requesting pain meds-RN notified    Home Living Family/patient expects to be discharged to:: Private residence Living Arrangements: Alone Available Help at Discharge: Family;Friend(s);Available PRN/intermittently Type of Home: Apartment Home Access: Stairs to enter     Home Layout: One level Home Equipment: Tub bench;Cane - single point      Prior Function Level of Independence: Independent with assistive device(s)         Comments: Mod indep with SPC. Does not drive; uses transportation services     Hand Dominance   Dominant Hand: Right    Extremity/Trunk Assessment   Upper Extremity Assessment Upper Extremity Assessment: Generalized weakness    Lower Extremity Assessment Lower Extremity Assessment: RLE deficits/detail RLE Deficits / Details: pain in sole of R foot where callus removed    Cervical / Trunk Assessment Cervical / Trunk Assessment: Normal  Communication   Communication: No difficulties  Cognition Arousal/Alertness: Awake/alert Behavior During Therapy:  (irritated that he is having  enterscopy again and have  not fixed source of bleeding) Overall Cognitive Status: Within Functional Limits for tasks assessed                                        General Comments General comments (skin integrity, edema, etc.): Pt just had callus removed from R foot and reports it is sore asking for Voltaran gel,VSS on RA        Assessment/Plan    PT Assessment Patient needs continued PT  services  PT Problem List Decreased mobility       PT Treatment Interventions DME instruction;Gait training;Stair training;Functional mobility training;Therapeutic activities;Therapeutic exercise;Balance training;Cognitive remediation;Patient/family education    PT Goals (Current goals can be found in the Care Plan section)  Acute Rehab PT Goals Patient Stated Goal: find out what is causing GIB and fix it PT Goal Formulation: With patient Time For Goal Achievement: 05/21/21 Potential to Achieve Goals: Good    Frequency Min 3X/week    AM-PAC PT "6 Clicks" Mobility  Outcome Measure Help needed turning from your back to your side while in a flat bed without using bedrails?: None Help needed moving from lying on your back to sitting on the side of a flat bed without using bedrails?: None Help needed moving to and from a bed to a chair (including a wheelchair)?: None Help needed standing up from a chair using your arms (e.g., wheelchair or bedside chair)?: None Help needed to walk in hospital room?: None Help needed climbing 3-5 steps with a railing? : A Little 6 Click Score: 23    End of Session   Activity Tolerance: Patient tolerated treatment well Patient left: in bed;with call bell/phone within reach Nurse Communication: Mobility status;Patient requests pain meds PT Visit Diagnosis: Muscle weakness (generalized) (M62.81);Other abnormalities of gait and mobility (R26.89)    Time: 1694-5038 PT Time Calculation (min) (ACUTE ONLY): 31 min   Charges:   PT Evaluation $PT Eval Moderate Complexity: 1 Mod PT Treatments $Therapeutic Exercise: 8-22 mins        Yuvia Plant B. [name] PT, DPT Acute Rehabilitation Services Pager [phone] Office [phone]   [place] 05/07/2021, 2:25 PM

## 2021-05-08 LAB — CULTURE, BLOOD (ROUTINE X 2)
Culture: NO GROWTH
Special Requests: ADEQUATE

## 2021-05-08 LAB — GLUCOSE, CAPILLARY
Glucose-Capillary: 107 mg/dL — ABNORMAL HIGH (ref 70–99)
Glucose-Capillary: 107 mg/dL — ABNORMAL HIGH (ref 70–99)

## 2021-05-08 MED ORDER — PANTOPRAZOLE SODIUM 40 MG PO TBEC: 40.0000 mg | DELAYED_RELEASE_TABLET | Freq: Two times a day (BID) | ORAL | 2 refills | Status: DC

## 2021-05-08 MED ORDER — POLYSACCHARIDE IRON COMPLEX 150 MG PO CAPS
150.0000 mg | ORAL_CAPSULE | Freq: Every day | ORAL | Status: DC
  Administered 2021-05-08: 150 mg via ORAL
  Filled 2021-05-08: qty 1

## 2021-05-08 MED ORDER — INSULIN LISPRO (1 UNIT DIAL) 100 UNIT/ML (KWIKPEN): PEN_INJECTOR | SUBCUTANEOUS | 1 refills | Status: DC

## 2021-05-08 MED ORDER — POLYSACCHARIDE IRON COMPLEX 150 MG PO CAPS: 150.0000 mg | ORAL_CAPSULE | Freq: Every day | ORAL | 1 refills | Status: DC

## 2021-05-08 NOTE — Progress Notes (Signed)
Progress Note for [facility]  Subjective: No complaints after the procedure.  Objective: Vital signs in last 24 hours: Temp:  [97.8 F (36.6 C)-98.3 F (36.8 C)] 98.3 F (36.8 C) (07/16 0520) Pulse Rate:  [64-86] 84 (07/16 0520) Resp:  [10-29] 18 (07/16 0520) BP: (89-152)/(31-84) 135/66 (07/16 0520) SpO2:  [90 %-100 %] 99 % (07/16 0520) Weight:  [78.4 kg] 78.4 kg (07/15 0800) Last BM Date: 05/06/21  Intake/Output from previous day: 07/15 0701 - 07/16 0700 In: 300 [I.V.:300] Out: 1941  Intake/Output this shift: No intake/output data recorded.  General appearance: alert and no distress GI: soft, non-tender; bowel sounds normal; no masses,  no organomegaly  Lab Results: Recent Labs    05/05/21 2314 05/06/21 1056 05/07/21 0406  WBC 5.1 4.6 4.4  HGB 8.0* 7.7* 8.0*  HCT 23.9* 23.8* 24.5*  PLT 82* 81* 84*   BMET Recent Labs    05/05/21 2314 05/06/21 1056 05/07/21 0406  NA 134* 135 134*  K 4.3 4.0 4.0  CL 99 98 98  CO2 25 26 26   GLUCOSE 108* 132* 110*  BUN 61* 70* 79*  CREATININE 5.80* 6.67* 7.71*  CALCIUM 7.7* 7.5* 7.9*   LFT Recent Labs    05/07/21 0406  ALBUMIN 3.1*   PT/INR No results for input(s): LABPROT, INR in the last 72 hours. Hepatitis Panel Recent Labs    05/05/21 0901  HEPBSAG NON REACTIVE   C-Diff No results for input(s): CDIFFTOX in the last 72 hours. Fecal Lactopherrin No results for input(s): FECLLACTOFRN in the last 72 hours.  Studies/Results: No results found.  Medications: Scheduled:  atorvastatin  10 mg Oral Daily   bictegravir-emtricitabine-tenofovir AF  1 tablet Oral Daily   calcium acetate  1,334 mg Oral TID WC   Chlorhexidine Gluconate Cloth  6 each Topical Q0600   darbepoetin (ARANESP) injection - DIALYSIS  150 mcg Intravenous Q Wed-HD   diclofenac Sodium  2 g Topical TID   folic acid  1 mg Oral Daily   gabapentin  300 mg Oral Daily   insulin aspart  0-9 Units Subcutaneous Q4H   latanoprost  1 drop Both Eyes QHS    multivitamin  1 tablet Oral Daily   pantoprazole (PROTONIX) IV  40 mg Intravenous Q12H   thiamine injection  100 mg Intravenous Daily   timolol  1 drop Both Eyes q AM   umeclidinium-vilanterol  1 puff Inhalation Daily   Continuous:  Assessment/Plan: 1) Gastric and small bowel AVMs. 2) ESRD. 3) Chronic anemia. 4) HIV.   From the GI standpoint he remains stable.  No reports of any bleeding, but he does not have an HGB for this AM.  With his history of recurrent GI bleed from the AVMs, it will be prudent to have the patient undergo repeat SBE with ablation to minimize acute GI bleeding hospitalizations.  Plan: 1) No further GI interventions at this time. 2) Signing off. 3) Follow up with [facility].  LOS: 3 days   Shaiann Mcmanamon D 05/08/2021, 7:20 AM

## 2021-05-08 NOTE — Discharge Summary (Signed)
Physician Discharge Summary  KATELYN KOHLMEYER JQG:[id number] DOB: [birth year] DOA: 05/04/2021  PCP: [name], NP  Admit date: 05/04/2021 Discharge date: 05/08/2021  Admitted From: Home  Disposition:  Home   Recommendations for Outpatient Follow-up:  Follow up with PCP in 1-2 weeks Please obtain BMP/CBC in one week Needs close follow up with GI, consider octreotide depot put patient to reduce recurrent bleeding due to numerous AVM.    Home Health: None  Discharge Condition:Stable.  CODE STATUS: Full code Diet recommendation: Heart Healthy   Brief/Interim Summary: 70 year old with past medical history significant for ESRD on hemodialysis, history of cocaine and alcohol abuse (EtOH not 4 months), HIV on H AA ART, hypertension, diabetes type 2.  Patient with recurrent GI bleed over past several months.  Admitted in April with GI bleed due to bleeding AVM and  Dieulafoy lesion in the duodenal bulb. Patient admitted 2 days ago on 7/11 with GI bleed and hemorrhagic shock.  He also had altered mental status due to uremia.  Patient was dialyzed.  Plan was to perform endoscopy by Dr. Carlean Purl note on 7/11 patient requested to leave on 7/12.   Patient returns 7/13 with recurrent melena hematochezia.  Hemoglobin 6.9.  He has received 2 unit of packed red blood cells during hemodialysis today on 7/13.    1-Recurrent GI bleed Patient presented with hematochezia, reported 1 cup of bright red blood -Hb 6.9 on admission -Continue with IV Protonix -CTA  abdomen and pelvis negative for source of bleeding -He received 2 units of packed red blood cells with hemodialysis 7/13 -GI has been consulted, patient will need enteroscopy when confusion improved. -underwent enteroscopy; S/P Argon plasma coagulation  of 19 AVM (15 in the stomach and 4 in the duodenum) Hb 8.0  Continue with PPI BID>   2-ABLA  due to GI bleed: Received 2 unit of packed red blood cell 7/13 Monitor hb. Decline hb check this  am.   3-ESRD: Mild encephalopathy due to uremia Hemodialysis 7/13 ----7/15  Hyperkalemia; correction with HD>    Hypertension: Resume BP meds.   Diabetes type 2: Continue with correction insulin   HIV: Continue with HAART.      Discharge Diagnoses:  Principal Problem:   GI bleed Active Problems:   Human immunodeficiency virus (HIV) disease ([place])   Essential hypertension   DM (diabetes mellitus) ([place])   AVM (arteriovenous malformation)   ESRD on hemodialysis ([place])   Acute blood loss anemia    Discharge Instructions  Discharge Instructions     Diet - low sodium heart healthy   Complete by: As directed    Increase activity slowly   Complete by: As directed       Allergies as of 05/08/2021       Reactions   Augmentin [amoxicillin-pot Clavulanate] Diarrhea, Other (See Comments)   Severe diarrhea   Mucinex Fast-max Other (See Comments)   Intense sweating    Amphetamines Other (See Comments)   Unknown reaction        Medication List     STOP taking these medications    aspirin EC 81 MG tablet       TAKE these medications    acetaminophen 650 MG CR tablet Commonly known as: TYLENOL Take 1,300 mg by mouth 3 (three) times daily.   allopurinol 100 MG tablet Commonly known as: ZYLOPRIM TAKE 1 TABLET(100 MG) BY MOUTH DAILY What changed:  how much to take how to take this when to take this additional instructions  Anoro Ellipta 62.5-25 MCG/INH Aepb Generic drug: umeclidinium-vilanterol INHALE 1 PUFF BY MOUTH EVERY DAY What changed: See the new instructions.   atorvastatin 10 MG tablet Commonly known as: LIPITOR Take 1 tablet (10 mg total) by mouth daily.   B-D UF III MINI PEN NEEDLES 31G X 5 MM Misc Generic drug: Insulin Pen Needle USE FOUR TIMES DAILY   Biktarvy 50-200-25 MG Tabs tablet Generic drug: bictegravir-emtricitabine-tenofovir AF Take 1 tablet by mouth daily.   Calcium Acetate 667 MG Tabs Take 667-1,334 mg by mouth See admin  instructions. Take 1,334 mg by mouth three times a day with meals and 667 mg with each snack   carvedilol 3.125 MG tablet Commonly known as: COREG TAKE 1 TABLET(3.125 MG) BY MOUTH TWICE DAILY What changed: See the new instructions.   colchicine 0.6 MG tablet TAKE 1 TABLET BY MOUTH AS NEEDED FOR GOUT What changed: See the new instructions.   diclofenac Sodium 1 % Gel Commonly known as: VOLTAREN APPLY 2 GRAMS TOPICALLY TO THE AFFECTED AREA THREE TIMES DAILY AS NEEDED FOR PAIN   folic acid 1 MG tablet Commonly known as: FOLVITE Take 1 tablet (1 mg total) by mouth daily.   gabapentin 300 MG capsule Commonly known as: NEURONTIN Take 1 capsule (300 mg total) by mouth daily. Dose change due to renal function   insulin lispro 100 UNIT/ML KwikPen Commonly known as: HUMALOG INJECT 13 UNITS UNDER THE SKIN THREE TIMES DAILY AS NEEDED FOR HIGH BLOOD SUGAR(ABOVE 150) What changed: additional instructions   iron polysaccharides 150 MG capsule Commonly known as: NIFEREX Take 1 capsule (150 mg total) by mouth daily.   isosorbide mononitrate 30 MG 24 hr tablet Commonly known as: IMDUR TAKE 1 TABLET BY MOUTH EVERY DAY What changed:  how much to take how to take this when to take this additional instructions   latanoprost 0.005 % ophthalmic solution Commonly known as: XALATAN Place 1 drop into both eyes at bedtime.   lidocaine-prilocaine cream Commonly known as: EMLA Apply 1 application topically every Monday, Wednesday, and Friday with hemodialysis.   MIRCERA IJ Mircera   multivitamin Tabs tablet Take 1 tablet by mouth daily.   nitroGLYCERIN 0.3 MG SL tablet Commonly known as: NITROSTAT PLACE 1 TABLET UNDER THE TONGUE AS NEEDED FOR CHEST PAIN What changed: See the new instructions.   OneTouch Delica Plus [id number] Misc Inject 1 Device as directed 3 (three) times daily. Dx: E11.40   OneTouch Ultra test strip Generic drug: glucose blood Check blood sugar three times daily.  Dx:E11.40   oxyCODONE-acetaminophen 5-325 MG tablet Commonly known as: PERCOCET/ROXICET Take 1 tablet by mouth daily as needed for severe pain.   pantoprazole 40 MG tablet Commonly known as: PROTONIX Take 1 tablet (40 mg total) by mouth 2 (two) times daily before a meal.   polyethylene glycol 17 g packet Commonly known as: MIRALAX / GLYCOLAX Take 17 g by mouth daily as needed for mild constipation.   timolol 0.5 % ophthalmic solution Commonly known as: TIMOPTIC Place 1 drop into both eyes in the morning.        Follow-up Information     Armbruster, Carlota Raspberry, MD. Call in 2 week(s).   Specialty: Gastroenterology Contact information: [address] [place] [address] [phone]         [name], NP Follow up in 1 week(s).   Specialty: Geriatric Medicine Contact information: [place]. [place] [address] [phone]  [name], MD .   Specialty: Infectious Diseases Contact information: [address] [place] [address] [phone]         [name], MD .   Specialty: Cardiology Contact information: [phone] [place] [address] [phone]                Allergies  Allergen Reactions   Augmentin [Amoxicillin-Pot Clavulanate] Diarrhea and Other (See Comments)    Severe diarrhea   Mucinex Fast-Max Other (See Comments)    Intense sweating    Amphetamines Other (See Comments)    Unknown reaction    Consultations: GI, [place]  Renal.    Procedures/Studies: Epidural Steroid injection  Result Date: 04/15/2021 Magnus Sinning, MD     04/18/2021  3:11 PM Lumbar Epidural Steroid Injection - Interlaminar Approach with Fluoroscopic Guidance Patient: SHIRAZ BASTYR     Date of Birth: [birth year] MRN: [id number] PCP: [name], NP     Visit Date: 04/15/2021  Universal Protocol:   Consent Given By: the patient Position: PRONE Additional Comments:  Vital signs were monitored before and after the procedure. Patient was prepped and draped in the usual sterile fashion. The correct patient, procedure, and site was verified. Injection Procedure Details: Procedure diagnoses: Lumbar radiculopathy [M54.16] Meds Administered: Meds ordered this encounter Medications  betamethasone acetate-betamethasone sodium phosphate (CELESTONE) injection 12 mg  Laterality: Right Location/Site:  L5-S1 Needle: 3.5 in., 20 ga. Tuohy Needle Placement: Paramedian epidural Findings:  -Comments: Excellent flow of contrast into the epidural space. Procedure Details: Using a paramedian approach from the side mentioned above, the region overlying the inferior lamina was localized under fluoroscopic visualization and the soft tissues overlying this structure were infiltrated with 4 ml. of 1% Lidocaine without Epinephrine. The Tuohy needle was inserted into the epidural space using a paramedian approach. The epidural space was localized using loss of resistance along with counter oblique bi-planar fluoroscopic views.  After negative aspirate for air, blood, and CSF, a 2 ml. volume of Isovue-250 was injected into the epidural space and the flow of contrast was observed. Radiographs were obtained for documentation purposes.  The injectate was administered into the level noted above. Additional Comments: No complications occurred Dressing: 2 x 2 sterile gauze and Band-Aid  Post-procedure details: Patient was observed during the procedure. Post-procedure instructions were reviewed. Patient left the clinic in stable condition.   CT Abdomen Pelvis Wo Contrast  Result Date: 05/02/2021 CLINICAL DATA:  Epigastric and diffuse abdominal pain. Episode of vomiting this morning. Recently diagnosed with H. pylori infection. EXAM: CT ABDOMEN AND PELVIS WITHOUT CONTRAST TECHNIQUE: Multidetector CT imaging of the abdomen and pelvis was performed following the standard protocol without IV contrast. COMPARISON:   02/15/2021 FINDINGS: Lower chest: Stable enlarged heart. Dense coronary artery calcifications. Post CABG changes. Clear lung bases. Hepatobiliary: No focal liver abnormality is seen. Status post cholecystectomy. No biliary dilatation. Small amount of interval biliary air. Pancreas: Unremarkable. No pancreatic ductal dilatation or surrounding inflammatory changes. Spleen: Normal in size without focal abnormality. Adrenals/Urinary Tract: Unremarkable adrenal glands. Dense bilateral renal vascular calcifications with no definite calculi seen. Small right renal cysts, including a small hemorrhagic or proteinaceous cyst. Unremarkable ureters and urinary bladder. Stomach/Bowel: Multiple sigmoid colon diverticula without evidence of diverticulitis. Normal appearing appendix. Unremarkable stomach and small bowel. Vascular/Lymphatic: Extensive dense atheromatous arterial calcifications, including severe calcifications throughout the proximal superior mesenteric artery and both proximal renal arteries. No aneurysm seen. No enlarged lymph nodes. Reproductive: Minimally  enlarged prostate gland. Other: Moderate left and minimal right inguinal hernias containing fat. Musculoskeletal: Left hip fixation hardware. Old, healed bilateral rib fractures. Lumbar and lower thoracic spine degenerative changes. IMPRESSION: 1. No acute abnormality. 2. Extensive, severe calcified atherosclerotic changes, as described above. These include the coronary arteries, superior mesenteric artery and renal arteries. 3. Sigmoid colon diverticulosis. 4. Stable cardiomegaly. Electronically Signed   By: Claudie Revering M.D.   On: 05/02/2021 12:42   DG Chest Portable 1 View  Result Date: 05/03/2021 CLINICAL DATA:  Vomiting and blood in stool, initial encounter EXAM: PORTABLE CHEST 1 VIEW COMPARISON:  03/04/2021 FINDINGS: Cardiac shadow is enlarged. Postsurgical changes are again seen. Aortic calcifications are noted. The lungs are clear bilaterally.  Chronic rib fractures on the right are noted. IMPRESSION: No acute abnormality noted. Electronically Signed   By: Inez Catalina M.D.   On: 05/03/2021 01:15   XR C-ARM NO REPORT  Result Date: 04/15/2021 Please see Notes tab for imaging impression.  CT Angio Abd/Pel w/ and/or w/o  Result Date: 05/05/2021 CLINICAL DATA:  anemia. History of same. States he has continued to have bleeding. Recent admission for the same but then left AMA. History of GI bleeds. History of HIV. History of dialysis. In the morning and was sent back to the ER. States he has had black stool and blood visible. States he is feeling little more confused. EXAM: CTA ABDOMEN AND PELVIS WITHOUT AND WITH CONTRAST TECHNIQUE: Multidetector CT imaging of the abdomen and pelvis was performed using the standard protocol during bolus administration of intravenous contrast. Multiplanar reconstructed images and MIPs were obtained and reviewed to evaluate the vascular anatomy. CONTRAST:  161mL OMNIPAQUE IOHEXOL 350 MG/ML SOLN COMPARISON:  None. FINDINGS: VASCULAR Aorta: Moderate calcified atheromatous plaque. No aneurysm, dissection, or stenosis. Celiac: Partially calcified ostial plaque without significant stenosis, patent distally with unremarkable distal branch anatomy. SMA: Heavily calcified plaque extending from the ostium 4 cm, resulting in at least moderate stenosis, atheromatous but patent distally. Renals: Single bilaterally, both with calcified ostial plaque resulting in stenosis of at indeterminate severity. IMA: Patent without evidence of aneurysm, dissection, vasculitis or significant stenosis. Inflow: Moderate calcified plaque. Mild stenosis in the left common iliac artery. Proximal stenosis in bilateral internal iliac arteries. External iliac arteries patent. Proximal Outflow: Atheromatous, patent Veins: Patent hepatic veins, portal vein, SM V, splenic vein, bilateral renal veins, iliac venous system and IVC. Review of the MIP images  confirms the above findings. NON-VASCULAR Lower chest: Sternotomy wires. No pleural or pericardial effusion. Coronary calcifications. Hepatobiliary: No focal liver abnormality is seen. Status post cholecystectomy. No biliary dilatation. Pancreas: Unremarkable. No pancreatic ductal dilatation or surrounding inflammatory changes. Spleen: Normal in size without focal abnormality. Adrenals/Urinary Tract: adrenal glands unremarkable. Small bilateral renal cysts. Calcifications in bilateral renal pelves predominately arterial. No hydronephrosis or ureterectasis. Urinary bladder is incompletely distended. Stomach/Bowel: Stomach is nondistended. Small bowel decompressed. Normal appendix. The colon is nondilated. A few scattered sigmoid diverticula. No evidence of active extravasation. Lymphatic: No abdominal or pelvic adenopathy. Reproductive: Prostate is unremarkable. Other: No ascites.  No free air. Musculoskeletal: Spondylitic changes in the lower lumbar spine. IM rod with sliding screw transfix left femoral neck. No acute fracture or worrisome bone lesion. IMPRESSION: 1. No evidence of active GI bleeding. 2. Scattered sigmoid diverticula 3. Coronary and aortoiliac atherosclerosis (ICD10-170.0) involving visceral and renal branches. Electronically Signed   By: Lucrezia Europe M.D.   On: 05/05/2021 14:30     Subjective: He is feeling well ,  denies abdominal pain. He wants to go home.    Discharge Exam: Vitals:   05/08/21 0844 05/08/21 0932  BP:  138/69  Pulse:  88  Resp:  20  Temp:  98 F (36.7 C)  SpO2: 99% 98%     General: Pt is alert, awake, not in acute distress Cardiovascular: RRR, S1/S2 +, no rubs, no gallops Respiratory: CTA bilaterally, no wheezing, no rhonchi Abdominal: Soft, NT, ND, bowel sounds + Extremities: no edema, no cyanosis    The results of significant diagnostics from this hospitalization (including imaging, microbiology, ancillary and laboratory) are listed below for reference.      Microbiology: Recent Results (from the past 240 hour(s))  Resp Panel by RT-PCR (Flu A&B, Covid) Nasopharyngeal Swab     Status: None   Collection Time: 05/03/21 12:37 AM   Specimen: Nasopharyngeal Swab; Nasopharyngeal(NP) swabs in vial transport medium  Result Value Ref Range Status   SARS Coronavirus 2 by RT PCR NEGATIVE NEGATIVE Final    Comment: (NOTE) SARS-CoV-2 target nucleic acids are NOT DETECTED.  The SARS-CoV-2 RNA is generally detectable in upper respiratory specimens during the acute phase of infection. The lowest concentration of SARS-CoV-2 viral copies this assay can detect is 138 copies/mL. A negative result does not preclude SARS-Cov-2 infection and should not be used as the sole basis for treatment or other patient management decisions. A negative result may occur with  improper specimen collection/handling, submission of specimen other than nasopharyngeal swab, presence of viral mutation(s) within the areas targeted by this assay, and inadequate number of viral copies(<138 copies/mL). A negative result must be combined with clinical observations, patient history, and epidemiological information. The expected result is Negative.  Fact Sheet for Patients:  EntrepreneurPulse.com.au  Fact Sheet for Healthcare Providers:  IncredibleEmployment.be  This test is no t yet approved or cleared by the [place] FDA and  has been authorized for detection and/or diagnosis of SARS-CoV-2 by FDA under an Emergency Use Authorization (EUA). This EUA will remain  in effect (meaning this test can be used) for the duration of the COVID-19 declaration under Section 564(b)(1) of the Act, 21 U.S.C.section 360bbb-3(b)(1), unless the authorization is terminated  or revoked sooner.       Influenza A by PCR NEGATIVE NEGATIVE Final   Influenza B by PCR NEGATIVE NEGATIVE Final    Comment: (NOTE) The Xpert Xpress SARS-CoV-2/FLU/RSV plus assay is  intended as an aid in the diagnosis of influenza from Nasopharyngeal swab specimens and should not be used as a sole basis for treatment. Nasal washings and aspirates are unacceptable for Xpert Xpress SARS-CoV-2/FLU/RSV testing.  Fact Sheet for Patients: EntrepreneurPulse.com.au  Fact Sheet for Healthcare Providers: IncredibleEmployment.be  This test is not yet approved or cleared by the [place] FDA and has been authorized for detection and/or diagnosis of SARS-CoV-2 by FDA under an Emergency Use Authorization (EUA). This EUA will remain in effect (meaning this test can be used) for the duration of the COVID-19 declaration under Section 564(b)(1) of the Act, 21 U.S.C. section 360bbb-3(b)(1), unless the authorization is terminated or revoked.  Performed at [facility], [place] [address], [place], [place] [address]   Blood culture (routine x 2)     Status: None (Preliminary result)   Collection Time: 05/03/21 12:52 AM   Specimen: BLOOD RIGHT FOREARM  Result Value Ref Range Status   Specimen Description BLOOD RIGHT FOREARM  Final   Special Requests   Final    BOTTLES DRAWN AEROBIC AND ANAEROBIC  Blood Culture adequate volume   Culture   Final    NO GROWTH 4 DAYS Performed at [facility] [address] [place], [place] [address]    Report Status PENDING  Incomplete  MRSA Next Gen by PCR, Nasal     Status: None   Collection Time: 05/03/21  3:41 AM   Specimen: Nasal Mucosa; Nasal Swab  Result Value Ref Range Status   MRSA by PCR Next Gen NOT DETECTED NOT DETECTED Final    Comment: (NOTE) The GeneXpert MRSA Assay (FDA approved for NASAL specimens only), is one component of a comprehensive MRSA colonization surveillance program. It is not intended to diagnose MRSA infection nor to guide or monitor treatment for MRSA infections. Test performance is not FDA approved in patients less than 14 years old. Performed at [facility], [place] [address], [place], [place] [address]   SARS CORONAVIRUS 2 (TAT 6-24 HRS) Nasopharyngeal Nasopharyngeal Swab     Status: None   Collection Time: 05/05/21  4:02 PM   Specimen: Nasopharyngeal Swab  Result Value Ref Range Status   SARS Coronavirus 2 NEGATIVE NEGATIVE Final    Comment: (NOTE) SARS-CoV-2 target nucleic acids are NOT DETECTED.  The SARS-CoV-2 RNA is generally detectable in upper and lower respiratory specimens during the acute phase of infection. Negative results do not preclude SARS-CoV-2 infection, do not rule out co-infections with other pathogens, and should not be used as the sole basis for treatment or other patient management decisions. Negative results must be combined with clinical observations, patient history, and epidemiological information. The expected result is Negative.  Fact Sheet for Patients: SugarRoll.be  Fact Sheet for Healthcare Providers: https://www.woods-mathews.com/  This test is not yet approved or cleared by the [place] FDA and  has been authorized for detection and/or diagnosis of SARS-CoV-2 by FDA under an Emergency Use Authorization (EUA). This EUA will remain  in effect (meaning this test can be used) for the duration of the COVID-19 declaration under Se ction 564(b)(1) of the Act, 21 U.S.C. section 360bbb-3(b)(1), unless the authorization is terminated or revoked sooner.  Performed at [facility], [place] [address], [place], [place] [address]      Labs: BNP (last 3 results) Recent Labs    02/14/21 0057  BNP 532.9*   Basic Metabolic Panel: Recent Labs  Lab 05/03/21 2135 05/04/21 0734 05/05/21 0308 05/05/21 2314 05/06/21 1056 05/07/21 0406  NA 136 136 137 134* 135 134*  K 4.4 5.4* 5.4* 4.3 4.0 4.0  CL 98 100 101 99 98 98  CO2 27 25 22 25 26 26   GLUCOSE 147* 117* 109* 108* 132* 110*  BUN 96* 115* 141* 61* 70* 79*  CREATININE 5.98* 6.96* 8.50* 5.80*  6.67* 7.71*  CALCIUM 8.0* 7.8* 7.9* 7.7* 7.5* 7.9*  MG 2.1 2.1  --   --   --  1.9  PHOS 4.2 5.3*  --   --  5.1* 5.9*   Liver Function Tests: Recent Labs  Lab 05/02/21 0743 05/03/21 0046 05/06/21 1056 05/07/21 0406  AST 22 25  --   --   ALT 15 15  --   --   ALKPHOS 44 31*  --   --   BILITOT 0.6 0.7  --   --   PROT 6.9 6.0*  --   --   ALBUMIN 3.1* 2.8* 3.1* 3.1*   Recent Labs  Lab 05/02/21 0743  LIPASE 87*   Recent Labs  Lab 05/05/21 1708  AMMONIA 49*   CBC: Recent Labs  Lab 05/03/21 0046 05/03/21 0101 05/04/21 0734 05/05/21 0308 05/05/21 1313 05/05/21 1708 05/05/21 2126 05/05/21 2314 05/06/21 1056 05/07/21 0406  WBC 6.5   < > 5.6 5.0  --   --   --  5.1 4.6 4.4  NEUTROABS 4.7  --   --   --   --   --   --   --   --   --   HGB 7.0*   < > 7.2* 6.7*   < > 8.3* 8.1* 8.0* 7.7* 8.0*  HCT 22.7*   < > 21.6* 21.4*   < > 25.0* 25.5* 23.9* 23.8* 24.5*  MCV 92.3   < > 88.9 94.7  --   --   --  88.2 91.2 92.1  PLT 101*   < > 79* 84*  --   --   --  82* 81* 84*   < > = values in this interval not displayed.   Cardiac Enzymes: No results for input(s): CKTOTAL, CKMB, CKMBINDEX, TROPONINI in the last 168 hours. BNP: Invalid input(s): POCBNP CBG: Recent Labs  Lab 05/07/21 1652 05/07/21 2014 05/07/21 2338 05/08/21 0406 05/08/21 0749  GLUCAP 71 158* 123* 107* 107*   D-Dimer No results for input(s): DDIMER in the last 72 hours. Hgb A1c No results for input(s): HGBA1C in the last 72 hours. Lipid Profile No results for input(s): CHOL, HDL, LDLCALC, TRIG, CHOLHDL, LDLDIRECT in the last 72 hours. Thyroid function studies No results for input(s): TSH, T4TOTAL, T3FREE, THYROIDAB in the last 72 hours.  Invalid input(s): FREET3 Anemia work up No results for input(s): VITAMINB12, FOLATE, FERRITIN, TIBC, IRON, RETICCTPCT in the last 72 hours. Urinalysis    Component Value Date/Time   COLORURINE YELLOW 10/16/2020 2136   APPEARANCEUR CLEAR 10/16/2020 2136   LABSPEC 1.016  10/16/2020 2136   PHURINE 5.0 10/16/2020 2136   GLUCOSEU NEGATIVE 10/16/2020 2136   GLUCOSEU NEG mg/dL 09/20/2010 2058   HGBUR SMALL (A) 10/16/2020 2136   HGBUR negative 05/31/2010 0948   BILIRUBINUR NEGATIVE 10/16/2020 2136   [place] NEGATIVE 10/16/2020 2136   PROTEINUR 30 (A) 10/16/2020 2136   UROBILINOGEN 0.2 06/24/2015 2336   NITRITE NEGATIVE 10/16/2020 2136   LEUKOCYTESUR NEGATIVE 10/16/2020 2136   Sepsis Labs Invalid input(s): PROCALCITONIN,  WBC,  LACTICIDVEN Microbiology Recent Results (from the past 240 hour(s))  Resp Panel by RT-PCR (Flu A&B, Covid) Nasopharyngeal Swab     Status: None   Collection Time: 05/03/21 12:37 AM   Specimen: Nasopharyngeal Swab; Nasopharyngeal(NP) swabs in vial transport medium  Result Value Ref Range Status   SARS Coronavirus 2 by RT PCR NEGATIVE NEGATIVE Final    Comment: (NOTE) SARS-CoV-2 target nucleic acids are NOT DETECTED.  The SARS-CoV-2 RNA is generally detectable in upper respiratory specimens during the acute phase of infection. The lowest concentration of SARS-CoV-2 viral copies this assay can detect is 138 copies/mL. A negative result does not preclude SARS-Cov-2 infection and should not be used as the sole basis for treatment or other patient management decisions. A negative result may occur with  improper specimen collection/handling, submission of specimen other than nasopharyngeal swab, presence of viral mutation(s) within the areas targeted by this assay, and inadequate number of viral copies(<138 copies/mL). A negative result must be combined with clinical observations, patient history, and epidemiological information. The expected result is Negative.  Fact Sheet for Patients:  EntrepreneurPulse.com.au  Fact Sheet for Healthcare Providers:  IncredibleEmployment.be  This test is no t yet  approved or cleared by the [place] and  has been authorized for detection and/or  diagnosis of SARS-CoV-2 by FDA under an Emergency Use Authorization (EUA). This EUA will remain  in effect (meaning this test can be used) for the duration of the COVID-19 declaration under Section 564(b)(1) of the Act, 21 U.S.C.section 360bbb-3(b)(1), unless the authorization is terminated  or revoked sooner.       Influenza A by PCR NEGATIVE NEGATIVE Final   Influenza B by PCR NEGATIVE NEGATIVE Final    Comment: (NOTE) The Xpert Xpress SARS-CoV-2/FLU/RSV plus assay is intended as an aid in the diagnosis of influenza from Nasopharyngeal swab specimens and should not be used as a sole basis for treatment. Nasal washings and aspirates are unacceptable for Xpert Xpress SARS-CoV-2/FLU/RSV testing.  Fact Sheet for Patients: EntrepreneurPulse.com.au  Fact Sheet for Healthcare Providers: IncredibleEmployment.be  This test is not yet approved or cleared by the [place] FDA and has been authorized for detection and/or diagnosis of SARS-CoV-2 by FDA under an Emergency Use Authorization (EUA). This EUA will remain in effect (meaning this test can be used) for the duration of the COVID-19 declaration under Section 564(b)(1) of the Act, 21 U.S.C. section 360bbb-3(b)(1), unless the authorization is terminated or revoked.  Performed at [facility], [place] [address] [place], [place] [address]   Blood culture (routine x 2)     Status: None (Preliminary result)   Collection Time: 05/03/21 12:52 AM   Specimen: BLOOD RIGHT FOREARM  Result Value Ref Range Status   Specimen Description BLOOD RIGHT FOREARM  Final   Special Requests   Final    BOTTLES DRAWN AEROBIC AND ANAEROBIC Blood Culture adequate volume   Culture   Final    NO GROWTH 4 DAYS Performed at [facility], [place] [address], [place], [place] [address]    Report Status PENDING  Incomplete  MRSA Next Gen by PCR, Nasal     Status: None   Collection Time: 05/03/21  3:41 AM    Specimen: Nasal Mucosa; Nasal Swab  Result Value Ref Range Status   MRSA by PCR Next Gen NOT DETECTED NOT DETECTED Final    Comment: (NOTE) The GeneXpert MRSA Assay (FDA approved for NASAL specimens only), is one component of a comprehensive MRSA colonization surveillance program. It is not intended to diagnose MRSA infection nor to guide or monitor treatment for MRSA infections. Test performance is not FDA approved in patients less than 46 years old. Performed at [facility], [place] [address] [place], [place] [address]   SARS CORONAVIRUS 2 (TAT 6-24 HRS) Nasopharyngeal Nasopharyngeal Swab     Status: None   Collection Time: 05/05/21  4:02 PM   Specimen: Nasopharyngeal Swab  Result Value Ref Range Status   SARS Coronavirus 2 NEGATIVE NEGATIVE Final    Comment: (NOTE) SARS-CoV-2 target nucleic acids are NOT DETECTED.  The SARS-CoV-2 RNA is generally detectable in upper and lower respiratory specimens during the acute phase of infection. Negative results do not preclude SARS-CoV-2 infection, do not rule out co-infections with other pathogens, and should not be used as the sole basis for treatment or other patient management decisions. Negative results must be combined with clinical observations, patient history, and epidemiological information. The expected result is Negative.  Fact Sheet for Patients: SugarRoll.be  Fact Sheet for Healthcare Providers: https://www.woods-mathews.com/  This test is not yet approved or cleared by the [place] FDA and  has been authorized for detection and/or  diagnosis of SARS-CoV-2 by FDA under an Emergency Use Authorization (EUA). This EUA will remain  in effect (meaning this test can be used) for the duration of the COVID-19 declaration under Se ction 564(b)(1) of the Act, 21 U.S.C. section 360bbb-3(b)(1), unless the authorization is terminated or revoked sooner.  Performed at [facility], [place] [address] [place], [place] [address]      Time coordinating discharge: 40 minutes  SIGNED:   Elmarie Shiley, MD  [facility]

## 2021-05-08 NOTE — Plan of Care (Signed)
  Problem: Education: Goal: Ability to identify signs and symptoms of gastrointestinal bleeding will improve Outcome: Progressing   Problem: Bowel/Gastric: Goal: Will show no signs and symptoms of gastrointestinal bleeding Outcome: Progressing   Problem: Fluid Volume: Goal: Compliance with measures to maintain balanced fluid volume will improve Outcome: Progressing

## 2021-05-08 NOTE — Progress Notes (Signed)
DISCHARGE NOTE HOME MAKO PELFREY to be discharged Home per MD order. Discussed prescriptions and follow up appointments with the patient. Prescriptions given to patient; medication list explained in detail. Patient verbalized understanding.  Skin clean, dry and intact without evidence of skin break down, no evidence of skin tears noted. IV catheter discontinued intact. Site without signs and symptoms of complications. Dressing and pressure applied. Pt denies pain at the site currently. No complaints noted.  Patient free of lines, drains, and wounds.   An After Visit Summary (AVS) was printed and given to the patient. Patient escorted via wheelchair, and discharged home via private auto.  Dolores Hoose, RN

## 2021-05-09 LAB — TYPE AND SCREEN
ABO/RH(D): A POS
Antibody Screen: NEGATIVE
Unit division: 0
Unit division: 0
Unit division: 0
Unit division: 0

## 2021-05-09 LAB — BPAM RBC
Blood Product Expiration Date: 202207132359
Blood Product Expiration Date: 202207132359
Blood Product Expiration Date: 202207252359
Blood Product Expiration Date: 202208042359
ISSUE DATE / TIME: 202207130608
ISSUE DATE / TIME: 202207130825
ISSUE DATE / TIME: 202207131020
Unit Type and Rh: 6200
Unit Type and Rh: 6200
Unit Type and Rh: 6200
Unit Type and Rh: 6200

## 2021-05-09 NOTE — Telephone Encounter (Signed)
Transition of care contact from inpatient facility  Date of discharge: 05/08/21 Date of contact: 05/09/21 Method: Phone Spoke to: Patient  Patient contacted to discuss transition of care from recent inpatient hospitalization. Patient was admitted to [facility]- 16/2022  ... with discharge diagnosis of .Recurrent  GI Bleed with AV Ms / ABL GI Bld..  Medication changes were reviewed.  Patient will follow up with his/her outpatient HD unit on:

## 2021-05-10 ENCOUNTER — Telehealth: Payer: Self-pay | Admitting: *Deleted

## 2021-05-10 DIAGNOSIS — N2581 Secondary hyperparathyroidism of renal origin: Secondary | ICD-10-CM | POA: Diagnosis not present

## 2021-05-10 DIAGNOSIS — Z992 Dependence on renal dialysis: Secondary | ICD-10-CM | POA: Diagnosis not present

## 2021-05-10 DIAGNOSIS — D631 Anemia in chronic kidney disease: Secondary | ICD-10-CM | POA: Diagnosis not present

## 2021-05-10 DIAGNOSIS — D509 Iron deficiency anemia, unspecified: Secondary | ICD-10-CM | POA: Diagnosis not present

## 2021-05-10 DIAGNOSIS — N186 End stage renal disease: Secondary | ICD-10-CM | POA: Diagnosis not present

## 2021-05-10 DIAGNOSIS — E1122 Type 2 diabetes mellitus with diabetic chronic kidney disease: Secondary | ICD-10-CM | POA: Diagnosis not present

## 2021-05-10 NOTE — Telephone Encounter (Signed)
Transition Care Management Unsuccessful Follow-up Telephone Call  Date of discharge and from where:  05/08/2021 [facility]  Attempts:  1st Attempt  Reason for unsuccessful TCM follow-up call:  Left voice message

## 2021-05-10 NOTE — Telephone Encounter (Signed)
Patient called back. Patient would not schedule a TOC Visit. Stated that his Dialysis Center can do his follow up and blood work.   Transition Care Management Follow-up Telephone Call Date of discharge and from where: 05/08/2021 [facility] How have you been since you were released from the hospital? Getting better, resting Any questions or concerns? No  Items Reviewed: Did the pt receive and understand the discharge instructions provided? Yes  Medications obtained and verified? Yes  Other? No  Any new allergies since your discharge? No  Dietary orders reviewed? Yes Do you have support at home? Yes   Home Care and Equipment/Supplies: Were home health services ordered? no If so, what is the name of the agency Has the agency set up a time to come to the patient's home? not applicable Were any new equipment or medical supplies ordered?  No What is the name of the medical supply agency? [facility]  Were you able to get the supplies/equipment? not applicable Do you have any questions related to the use of the equipment or supplies? No  Functional Questionnaire: (I = Independent and D = Dependent) ADLs: I  Bathing/Dressing- I  Meal Prep- I  Eating- I  Maintaining continence- I  Transferring/Ambulation- D has someone that takes him to appointments.   Managing Meds- I  Follow up appointments reviewed:  PCP Hospital f/u appt confirmed?  Patient Does not want to schedule an appointment. Stated that Dialysis can do all of his bloodwork and follow up.  Would not schedule with PCP Specialist Hospital f/u appt confirmed? No   Are transportation arrangements needed? No  If their condition worsens, is the pt aware to call PCP or go to the Emergency Dept.? Yes Was the patient provided with contact information for the PCP's office or ED? Yes Was to pt encouraged to call back with questions or concerns? Yes

## 2021-05-10 NOTE — Telephone Encounter (Signed)
Noted  

## 2021-05-11 ENCOUNTER — Telehealth: Payer: Self-pay

## 2021-05-11 ENCOUNTER — Encounter (HOSPITAL_COMMUNITY): Payer: Self-pay

## 2021-05-11 ENCOUNTER — Ambulatory Visit: Payer: Medicare Other

## 2021-05-11 ENCOUNTER — Ambulatory Visit (INDEPENDENT_AMBULATORY_CARE_PROVIDER_SITE_OTHER): Payer: Medicare Other | Admitting: Orthopaedic Surgery

## 2021-05-11 ENCOUNTER — Encounter: Payer: Self-pay | Admitting: Orthopaedic Surgery

## 2021-05-11 VITALS — Ht 74.0 in | Wt 172.0 lb

## 2021-05-11 DIAGNOSIS — M48061 Spinal stenosis, lumbar region without neurogenic claudication: Secondary | ICD-10-CM

## 2021-05-11 NOTE — Telephone Encounter (Signed)
Incoming call received from patient stating he seen Dr. Lorin Mercy today and was instructed to call PCP for pain medication refill request on Oxycodone 5-325 mg, 1 by mouth daily as needed until pending appointment with Dr.Nitka on 06/10/21  I reviewed Dr.Yates note to confirm the above and note is incomplete. Patient is aware [name], NP is out of office and I will send to covering provider Marlowe Sax, NP for further review.  Side Note: Dinah if you decide to approve you can check the [place] database for last refill   Pharmacy confirmed as [facility] on [address]

## 2021-05-11 NOTE — Telephone Encounter (Signed)
Left message on voicemail for patient to return call when available   

## 2021-05-11 NOTE — Telephone Encounter (Signed)
Patient came back in to waiting room as he was waiting for his ride. Per Dr. Lorin Mercy, he is unable to prescribe the Oxycodone that the patient requests until he sees Dr. Louanne Skye. He has advised patient to see his PCP who may decide to refer him to pain clinic. Patient was upset and states that his PCP is not going to do anything and that is the reason that she sent him here. I explained that I was sorry, however, Dr. Lorin Mercy does not prescribe pain medication usually unless patient is post op. Patient asks that we call him if earlier appointment becomes available with Dr. Louanne Skye.    Christy-could you put patient on wait list for Dr. Louanne Skye in case an earlier appointment becomes available? Thanks.

## 2021-05-11 NOTE — Telephone Encounter (Signed)
Added to the cancellation list °

## 2021-05-11 NOTE — Telephone Encounter (Signed)
FYI patient was seen in our office today patient was checking out and he asked to speak with Gwinda Passe per Gwinda Passe he will have to wait a few minutes due to her and Dr.Yates being in clinic patient spoke to [facility] about getting pain medication per Gwinda Passe she had to discuss with Dr.Yates ,Lorin Mercy is currently in the room with a patient Gwinda Passe and I advised patient she has to discuss with Dr.Yates before giving him a answer and that he will have to wait, patient began to become impatient and stated it is ridiculous that he is waiting this long I advised patient per betsy she can either give him a call once she discusses with Dr.Yates or he can wait, patient stated this happened to him in the past and that he never got a call back patient stated he is going to find a new doctor.

## 2021-05-11 NOTE — Progress Notes (Signed)
Office Visit Note   Patient: Jacob [name]           Date of Birth: Apr 04, 1951           MRN: [id number] Visit Date: 05/11/2021              Requested by: [name], NP [place],  [place] [address] PCP: [name], NP   Assessment & Plan: Visit Diagnoses:  1. Lumbar foraminal stenosis     Plan: Patient's MRI scan is reviewed with him again.  He has some anterolisthesis 5 mm at L4-5 increased from 3 mm.  Disc protrusion.  Past history of fractures, chronic dialysis ,bone quality affected by his dialysis.  Patient is requesting Percocet.  I discussed with him that we do not do pain management in this office.  Patient states he used to do street drugs but has not done them in years.He states his PCP would not give him narcotics.  Patient has cardiomyopathy previous CABG procedure, renal disease, pulmonary disease still smoking.  We will ask him to see Dr. Merrilee Seashore to see if he has anything else to offer him at this time.  Patient left the office came back 10 to 15 minutes later again requesting Percocet and he was told that we are unable to supply this.  Follow-Up Instructions: Return if symptoms worsen or fail to improve.   Orders:  No orders of the defined types were placed in this encounter.  No orders of the defined types were placed in this encounter.     Procedures: No procedures performed   Clinical Data: No additional findings.   Subjective: Chief Complaint  Patient presents with  . Lower Back - Pain, Follow-up    HPI 70 year old male returns with ongoing problems with back pain.  Patient was in the hospital last week with GI issues.  He is on chronic dialysis.  Patient states he needs more pain medication.  He states tramadol is not effective.  Patient smokes, type 2 diabetes, previous greater trochanter fracture, metatarsal fracture.  Past history of subdural hematoma.  History of substance abuse.  Ammonia level 05/05/2021 was elevated at  49 normal is 9-35.  A1c good level at 5.8.  Positive HIV on medication.  Review of Systems positive for renal failure, recent GI bleed, positive for gout positive hypertension.  Coronary artery disease, COPD, positive smoking.  No active dyspnea no current chest pain.   Objective: Vital Signs: Ht 6\' 2"  (1.88 m)   Wt 172 lb (78 kg)   BMI 22.08 kg/m   Physical Exam Constitutional:      Appearance: He is well-developed.  HENT:     Head: Normocephalic and atraumatic.     Right Ear: External ear normal.     Left Ear: External ear normal.  Eyes:     Pupils: Pupils are equal, round, and reactive to light.  Neck:     Thyroid: No thyromegaly.     Trachea: No tracheal deviation.  Cardiovascular:     Rate and Rhythm: Normal rate.  Pulmonary:     Effort: Pulmonary effort is normal.     Breath sounds: No wheezing.  Abdominal:     General: Bowel sounds are normal.     Palpations: Abdomen is soft.  Musculoskeletal:     Cervical back: Neck supple.  Skin:    General: Skin is warm and dry.     Capillary Refill: Capillary refill takes less than 2 seconds.  Neurological:     Mental Status: He is alert and oriented to person, place, and time.  Psychiatric:        Behavior: Behavior normal.        Thought Content: Thought content normal.        Judgment: Judgment normal.    Ortho Exam negative straight leg raising 90 degrees.  Anterior tib EHL is active.  Trace lower extremity edema.  Specialty Comments:  No specialty comments available.  Imaging: CLINICAL DATA:  Dialysis patient.  Low back pain.   EXAM: MRI LUMBAR SPINE WITHOUT AND WITH CONTRAST   TECHNIQUE: Multiplanar and multiecho pulse sequences of the lumbar spine were obtained without and with intravenous contrast.   CONTRAST:  7.67mL GADAVIST GADOBUTROL 1 MMOL/ML IV SOLN   COMPARISON:  Radiography 09/08/2020.  MRI 12/29/2015   FINDINGS: Segmentation: 5 lumbar type vertebral bodies as numbered previously.   Alignment:  5 mm degenerative anterolisthesis at L4-5, increased a mm or 2 since 2017.   Vertebrae: Discogenic endplate marrow changes at L4-5 and L5-S1 with mild edema and enhancement could contribute to low back pain.   Conus medullaris and cauda equina: Conus extends to the L1 level. Conus and cauda equina appear normal.   Paraspinal and other soft tissues: Small renal cysts. No acute finding.   Disc levels:   T11-12 and T12-L1: Normal   L1-2 and L2-3: Minimal disc bulges.  No stenosis.   L3-4: Minimal disc bulge. Mild facet and ligamentous hypertrophy. Mild narrowing of the lateral recesses but no apparent neural compression. The facet arthritis could contribute to low back pain. The facet arthritis has worsened slightly since 2017.   L4-5: Bilateral facet degeneration with facet and ligamentous hypertrophy. 5 mm of anterolisthesis, increased from about 3 mm in 2017. Worsened disc degeneration with broad-based herniation of the disc. Multifactorial spinal stenosis at this level that could cause neural compression on either or both sides. Bilateral foraminal stenosis could affect the exiting L4 nerves as well. Synovial cyst previously seen associated with the facet on the left is not clearly recognizable.   L5-S1: Disc degeneration with loss of disc height. Endplate osteophytes and shallow protrusion of the disc. Facet degeneration and hypertrophy. Stenosis of both subarticular lateral recesses and neural foramina could cause neural compression on either or both sides. Findings have worsened since the previous exam.   IMPRESSION: L3-4: Mild bulging of the disc. Facet degeneration and hypertrophy which is progressive since 2017. No sign of neural compressive stenosis. The facet arthritis could contribute to low back pain.   L4-5: Worsening of disease at this level. Bilateral facet degeneration and hypertrophy with anterolisthesis of 5 mm, increased from 3 mm previously. Broad-based  herniation of the disc. Stenosis of the canal, lateral recesses and neural foramina could cause neural compression on either or both sides. Discogenic endplate marrow changes could relate to low back pain. The facet arthropathy could relate to low back pain.   L5-S1: Worsening of disease at this level. Disc degeneration with loss of disc height, endplate osteophytes and shallow protrusion of the disc. Facet degeneration and hypertrophy. Stenosis of the subarticular lateral recesses and neural foramina that could cause neural compression on either or both sides. Endplate edematous changes could relate to low back pain.     Electronically Signed   By: Nelson Chimes M.D.   On: 03/23/2021 14:41   PMFS History: Patient Active Problem List   Diagnosis Date Noted  . Thrombocytopenia ([place])   . Helicobacter pylori (  H. pylori) infection 04/06/2021  . Lumbar foraminal stenosis 03/30/2021  . Gastric hemorrhage due to angiodysplasia of stomach   . GI bleed 11/24/2020  . COVID-19 virus infection 11/24/2020  . Other mechanical complication of cranial or spinal infusion catheter, sequela 11/11/2020  . Subdural hematoma ([place]) 11/06/2020  . Fall at home, initial encounter 11/05/2020  . Nicotine dependence, cigarettes, uncomplicated 16/07/9603  . Alcohol abuse 11/05/2020  . Unspecified trochanteric fracture of left femur, initial encounter for closed fracture ([place]) 11/05/2020  . Traumatic subdural hematoma, initial encounter ([place]) 11/05/2020  . Fracture of metatarsal of right foot, closed 11/05/2020  . Nondisplaced fracture of greater trochanter of left femur, initial encounter for closed fracture ([place])   . Uremia   . Angiodysplasia of intestine with hemorrhage   . Pruritus, unspecified 10/02/2020  . Iron deficiency anemia   . Acute blood loss anemia   . Acute on chronic anemia   . Abnormal nuclear stress test 12/19/2019  . Type 2 diabetes mellitus with diabetic neuropathy, unspecified ([place])  08/07/2019  . Allergy, unspecified, initial encounter 08/02/2019  . Complication of vascular dialysis catheter 08/02/2019  . Drug abuse counseling and surveillance of drug abuser 08/02/2019  . Other specified coagulation defects ([place]) 08/02/2019  . Secondary hyperparathyroidism of renal origin ([place]) 08/02/2019  . Unspecified atrial fibrillation ([place]) 08/02/2019  . ESRD on hemodialysis ([place])   . Cocaine abuse ([place])   . CHF exacerbation ([place]) 07/21/2019  . Diabetic foot ulcer ([place]) 02/11/2019  . AVM (arteriovenous malformation)   . Melena 02/07/2019  . Symptomatic anemia 02/06/2019  . Hyperlipidemia associated with type 2 diabetes mellitus ([place]) 05/30/2018  . Right foot ulcer ([place]) 02/28/2018  . Chronic obstructive pulmonary disease ([place]) 02/28/2018  . Anemia of chronic disease 11/20/2016  . CHF (congestive heart failure) ([place]) 11/10/2016  . History of lumbar laminectomy for spinal cord decompression 02/29/2016  . Type 2 diabetes mellitus with diabetic polyneuropathy, with long-term current use of insulin ([place]) 06/17/2015  . HTN (hypertension) 04/26/2015  . DM (diabetes mellitus) ([place]) 04/26/2015  . Ischemic cardiomyopathy 05/12/2014  . Shock ([place]) 09/06/2013  . Bunion of left foot 11/25/2011  . Bunion, right foot 11/25/2011  . Polysubstance abuse ([place]) 07/28/2011  . Hip fracture, left ([place]) 04/04/2011  . Closed fracture of neck of femur ([place]) 04/04/2011  . Insomnia 02/18/2011  . Coronary artery disease involving native heart without angina pectoris 04/20/2009  . Chronic combined systolic (congestive) and diastolic (congestive) heart failure ([place]) 04/20/2009  . Mixed hyperlipidemia 11/20/2006  . Gout 11/20/2006  . TOBACCO ABUSE 11/20/2006  . Essential hypertension 11/20/2006  . Human immunodeficiency virus (HIV) disease ([place]) 09/02/2006   Past Medical History:  Diagnosis Date  . Acute respiratory failure ([place]) 03/01/2018  . Anemia   . Arthritis    "all over; mostly knees and  back" (02/28/2018)  . Chronic combined systolic and diastolic CHF (congestive heart failure) ([place])   . Chronic lower back pain    stenosis  . Community acquired pneumonia 09/06/2013  . COPD (chronic obstructive pulmonary disease) ([place])   . Coronary atherosclerosis of native coronary artery    a. 02/2003 s/p CABG x 2 (VG->RI, VG->RPDA; b. 11/2019 PCI: LM nl, LAD 90d, D3 50, RI 100, LCX 100p, OM3 100 - fills via L->L collats from D2/dLAD, RCA 100p, VG->RPDA ok, VG->RI 95 (3.5x48 Synergy XD DES).  . Drug abuse ([place])    hx; tested for cocaine as recently as 2/08. says he is not using drugs now - avoided  defib. for this reason   . ESRD (end stage renal disease) ([place])    [place] M-W-F- Richarda Blade  . Fall at home 10/2020  . GERD (gastroesophageal reflux disease)    takes OTC meds as needed  . GI bleeding    a. 11/2019 EGD: angiodysplastic lesions w/ bleeding s/p argon plasma/clipping/epi inj. Multiple admissions for the same.  . Glaucoma    uses eye drops daily  . Hepatitis B 1968   "tx'd w/isolation; caught it from toilet stools in gym"  . History of blood transfusion 03/01/2019  . History of colon polyps    benign  . History of gout    takes Allopurinol daily as well as Colchicine-if needed (02/28/2018)  . History of kidney stones   . HTN (hypertension)    takes Coreg,Imdur.and Apresoline daily  . Human immunodeficiency virus (HIV) disease ([place]) dx'd 20   on [place] as of 12/2020.    [name] Hyperlipidemia   . Ischemic cardiomyopathy    a. 01/2019 Echo: EF 40-45%, diffuse HK, mild basal septal hypertrophy. Diast dysfxn. Nl RV size/fxn. Sev dil LA. Triv MR/TR/PR.  [name] Muscle spasm    takes Zanaflex as needed  . Myocardial infarction ([place]) ~ 2004/2005  . Nocturia   . Peripheral neuropathy    takes gabapentin daily  . Pneumonia    "at least twice" (02/28/2018)  . SDH (subdural hematoma) ([place])   . Syphilis, unspecified   . Type II diabetes mellitus ([place]) 2004   Lantus daily.Average fasting blood sugar  125-199  . Wears glasses   . Wears partial dentures     Family History  Problem Relation Age of Onset  . Heart failure Father   . Hypertension Father   . Diabetes Brother   . Heart attack Brother   . Alzheimer's disease Mother   . Stroke Sister   . Diabetes Sister   . Alzheimer's disease Sister   . Hypertension Brother   . Diabetes Brother   . Drug abuse Brother   . Colon cancer Neg Hx     Past Surgical History:  Procedure Laterality Date  . AV FISTULA PLACEMENT Left 08/02/2018   Procedure: ARTERIOVENOUS (AV) FISTULA CREATION  left arm radiocephlic;  Surgeon: Marty Heck, MD;  Location: [facility];  Service: Vascular;  Laterality: Left;  . AV FISTULA PLACEMENT Left 08/01/2019   Procedure: LEFT BRACHIOCEPHALIC ARTERIOVENOUS (AV) FISTULA CREATION;  Surgeon: [name], MD;  Location: [facility];  Service: Vascular;  Laterality: Left;  . BASCILIC VEIN TRANSPOSITION Left 10/03/2019   Procedure: BASILIC VEIN TRANSPOSITION LEFT SECOND STAGE;  Surgeon: [name], MD;  Location: [place] [facility];  Service: Vascular;  Laterality: Left;  . BIOPSY  01/25/2021   Procedure: BIOPSY;  Surgeon: Doran Stabler, MD;  Location: [facility] ENDOSCOPY;  Service: Gastroenterology;;  . CARDIAC CATHETERIZATION  10/2002; 12/19/2004   [name] 03/08/2011  . COLONOSCOPY  2013   Dr.John Henrene Pastor   . CORONARY ARTERY BYPASS GRAFT  02/24/2003   CABG X2/notes 03/08/2011  . CORONARY STENT INTERVENTION N/A 12/19/2019   Procedure: CORONARY STENT INTERVENTION;  Surgeon: Jettie Booze, MD;  Location: [facility] CV LAB;  Service: Cardiovascular;  Laterality: N/A;  . ENTEROSCOPY N/A 01/25/2021   Procedure: ENTEROSCOPY;  Surgeon: Doran Stabler, MD;  Location: [place] [facility];  Service: Gastroenterology;  Laterality: N/A;  . ENTEROSCOPY N/A 02/13/2021   Procedure: ENTEROSCOPY;  Surgeon: Jackquline Denmark, MD;  Location: [facility] ENDOSCOPY;  Service: Endoscopy;  Laterality: N/A;  .  ENTEROSCOPY N/A 05/07/2021   Procedure: ENTEROSCOPY;   Surgeon: Yetta Flock, MD;  Location: [facility] ENDOSCOPY;  Service: Gastroenterology;  Laterality: N/A;  . ESOPHAGOGASTRODUODENOSCOPY (EGD) WITH PROPOFOL N/A 02/08/2019   Procedure: ESOPHAGOGASTRODUODENOSCOPY (EGD) WITH PROPOFOL;  Surgeon: Milus Banister, MD;  Location: [place];  Service: Gastroenterology;  Laterality: N/A;  . ESOPHAGOGASTRODUODENOSCOPY (EGD) WITH PROPOFOL N/A 12/22/2019   Procedure: ESOPHAGOGASTRODUODENOSCOPY (EGD) WITH PROPOFOL;  Surgeon: Lavena Bullion, DO;  Location: [facility];  Service: Gastroenterology;  Laterality: N/A;  . ESOPHAGOGASTRODUODENOSCOPY (EGD) WITH PROPOFOL N/A 10/19/2020   Procedure: ESOPHAGOGASTRODUODENOSCOPY (EGD) WITH PROPOFOL;  Surgeon: Jackquline Denmark, MD;  Location[facility] ENDOSCOPY;  Service: Endoscopy;  Laterality: N/A;  . ESOPHAGOGASTRODUODENOSCOPY (EGD) WITH PROPOFOL N/A 12/22/2020   Procedure: ESOPHAGOGASTRODUODENOSCOPY (EGD) WITH PROPOFOL;  Surgeon: Gatha Mayer, MD;  Location: [place];  Service: Endoscopy;  Laterality: N/A;  . ESOPHAGOGASTRODUODENOSCOPY (EGD) WITH PROPOFOL N/A 01/09/2021   Procedure: ESOPHAGOGASTRODUODENOSCOPY (EGD) WITH PROPOFOL;  Surgeon: Irene Shipper, MD;  Location: [facility] ENDOSCOPY;  Service: Endoscopy;  Laterality: N/A;  . HEMOSTASIS CLIP PLACEMENT  12/22/2019   Procedure: HEMOSTASIS CLIP PLACEMENT;  Surgeon: Lavena Bullion, DO;  Location: [place] ENDOSCOPY;  Service: Gastroenterology;;  . HEMOSTASIS CLIP PLACEMENT  12/22/2020   Procedure: HEMOSTASIS CLIP PLACEMENT;  Surgeon: Gatha Mayer, MD;  Location: [facility] ENDOSCOPY;  Service: Endoscopy;;  . HEMOSTASIS CONTROL  12/22/2020   Procedure: HEMOSTASIS CONTROL/hemospray;  Surgeon: Gatha Mayer, MD;  Location: [place];  Service: Endoscopy;;  . HOT HEMOSTASIS N/A 02/08/2019   Procedure: HOT HEMOSTASIS (ARGON PLASMA COAGULATION/BICAP);  Surgeon: Milus Banister, MD;  Location: [facility] ENDOSCOPY;  Service: Gastroenterology;  Laterality: N/A;  . HOT HEMOSTASIS N/A 12/22/2019    Procedure: HOT HEMOSTASIS (ARGON PLASMA COAGULATION/BICAP);  Surgeon: Lavena Bullion, DO;  Location: [facility] ENDOSCOPY;  Service: Gastroenterology;  Laterality: N/A;  . HOT HEMOSTASIS N/A 10/19/2020   Procedure: HOT HEMOSTASIS (ARGON PLASMA COAGULATION/BICAP);  Surgeon: Jackquline Denmark, MD;  Location: [facility] ENDOSCOPY;  Service: Endoscopy;  Laterality: N/A;  . HOT HEMOSTASIS N/A 12/22/2020   Procedure: HOT HEMOSTASIS (ARGON PLASMA COAGULATION/BICAP);  Surgeon: Gatha Mayer, MD;  Location: [facility] ENDOSCOPY;  Service: Endoscopy;  Laterality: N/A;  . HOT HEMOSTASIS N/A 01/09/2021   Procedure: HOT HEMOSTASIS (ARGON PLASMA COAGULATION/BICAP);  Surgeon: Irene Shipper, MD;  Location: [facility] ENDOSCOPY;  Service: Endoscopy;  Laterality: N/A;  . HOT HEMOSTASIS N/A 01/25/2021   Procedure: HOT HEMOSTASIS (ARGON PLASMA COAGULATION/BICAP);  Surgeon: Doran Stabler, MD;  Location: [place];  Service: Gastroenterology;  Laterality: N/A;  . HOT HEMOSTASIS N/A 02/13/2021   Procedure: HOT HEMOSTASIS (ARGON PLASMA COAGULATION/BICAP);  Surgeon: Jackquline Denmark, MD;  Location: [facility] ENDOSCOPY;  Service: Endoscopy;  Laterality: N/A;  . HOT HEMOSTASIS N/A 05/07/2021   Procedure: HOT HEMOSTASIS (ARGON PLASMA COAGULATION/BICAP);  Surgeon: Yetta Flock, MD;  Location: [facility] ENDOSCOPY;  Service: Gastroenterology;  Laterality: N/A;  . INTERTROCHANTERIC HIP FRACTURE SURGERY Left 11/2006   [name] 03/08/2011  . INTRAVASCULAR ULTRASOUND/IVUS N/A 12/19/2019   Procedure: Intravascular Ultrasound/IVUS;  Surgeon: Jettie Booze, MD;  Location: [place] CV LAB;  Service: Cardiovascular;  Laterality: N/A;  . IR FLUORO GUIDE CV LINE RIGHT  07/24/2019  . IR FLUORO GUIDE CV LINE RIGHT  07/30/2019  . IR US GUIDE VASC ACCESS RIGHT  07/24/2019  . IR US GUIDE VASC ACCESS RIGHT  07/30/2019  . LAPAROSCOPIC CHOLECYSTECTOMY  05/2006  . LIGATION OF COMPETING BRANCHES OF ARTERIOVENOUS FISTULA Left 11/05/2018   Procedure: LIGATION OF COMPETING BRANCHES  OF ARTERIOVENOUS FISTULA  LEFT  ARM;  Surgeon: Marty Heck, MD;  Location: [place];  Service: Vascular;  Laterality: Left;  . LUMBAR LAMINECTOMY/DECOMPRESSION MICRODISCECTOMY N/A 02/29/2016   Procedure: Left L4-5 Lateral Recess Decompression, Removal Extradural Intraspinal Facet Cyst;  Surgeon: Marybelle Killings, MD;  Location: [place];  Service: Orthopedics;  Laterality: N/A;  . MULTIPLE TOOTH EXTRACTIONS    . ORIF MANDIBULAR FRACTURE Left 08/13/2004   ORIF of left body fracture mandible with KLS Martin 2.3-mm six hole/notes 03/08/2011  . RIGHT/LEFT HEART CATH AND CORONARY/GRAFT ANGIOGRAPHY N/A 12/19/2019   Procedure: RIGHT/LEFT HEART CATH AND CORONARY/GRAFT ANGIOGRAPHY;  Surgeon: Jettie Booze, MD;  Location: [place] CV LAB;  Service: Cardiovascular;  Laterality: N/A;  . SCLEROTHERAPY  12/22/2019   Procedure: SCLEROTHERAPY;  Surgeon: Lavena Bullion, DO;  Location: [facility] ENDOSCOPY;  Service: Gastroenterology;;  . Clide Deutscher  02/13/2021   Procedure: Clide Deutscher;  Surgeon: Jackquline Denmark, MD;  Location: [facility] ENDOSCOPY;  Service: Endoscopy;;   Social History   Occupational History  . Occupation: retired  Tobacco Use  . Smoking status: Every Day    Packs/day: 0.50    Years: 43.00    Pack years: 21.50    Types: Cigarettes  . Smokeless tobacco: Never  Vaping Use  . Vaping Use: Never used  Substance and Sexual Activity  . Alcohol use: Not Currently    Alcohol/week: 12.0 standard drinks    Types: 12 Standard drinks or equivalent per week    Comment: occassional  . Drug use: Not Currently    Types: Cocaine    Comment: hx of crack/cocaine 58yrs ago 10/01/2019- none  . Sexual activity: Yes    Partners: Male    Comment: Declined condoms

## 2021-05-12 ENCOUNTER — Telehealth: Payer: Self-pay | Admitting: Nurse Practitioner

## 2021-05-12 ENCOUNTER — Telehealth: Payer: Self-pay | Admitting: Specialist

## 2021-05-12 DIAGNOSIS — Z992 Dependence on renal dialysis: Secondary | ICD-10-CM | POA: Diagnosis not present

## 2021-05-12 DIAGNOSIS — N186 End stage renal disease: Secondary | ICD-10-CM | POA: Diagnosis not present

## 2021-05-12 DIAGNOSIS — D509 Iron deficiency anemia, unspecified: Secondary | ICD-10-CM | POA: Diagnosis not present

## 2021-05-12 DIAGNOSIS — D631 Anemia in chronic kidney disease: Secondary | ICD-10-CM | POA: Diagnosis not present

## 2021-05-12 DIAGNOSIS — N2581 Secondary hyperparathyroidism of renal origin: Secondary | ICD-10-CM | POA: Diagnosis not present

## 2021-05-12 DIAGNOSIS — E1122 Type 2 diabetes mellitus with diabetic chronic kidney disease: Secondary | ICD-10-CM | POA: Diagnosis not present

## 2021-05-12 NOTE — Telephone Encounter (Signed)
Called patient to give a status update to make him aware we are waiting on a reply from Dr.Yates.

## 2021-05-12 NOTE — Telephone Encounter (Signed)
Reviewed med oxycodone not being prescribed by PCP.recommend refill from prescribing provider.

## 2021-05-12 NOTE — Telephone Encounter (Signed)
Pt has upcoming appt with Dr. Louanne Skye on 06/10/21. Pt asked to speak with his nurse, would not tell me any details or questions to include in the message to the nurse. The best call back number is [phone].

## 2021-05-12 NOTE — Telephone Encounter (Signed)
Patient called back and stated he is disappointment with the care he is receiving from our office. Patient requested to speak with the practice administrator.   Message printed off and given to [facility]

## 2021-05-12 NOTE — Telephone Encounter (Signed)
Jacob [name], Patient is wanting to "Why did Dr. Lorin Mercy send me to Dr. Louanne Skye?" I tried to advise that I was not sure as the note is not complete however it may have been for 2nd opinion for his back. He states that he doesn't know why Dr. Lorin Mercy cut him off from that pain meds, states that he only takes 1 a day. I advised that typically unless you are within a 12 postop period you don't get pain meds.

## 2021-05-12 NOTE — Telephone Encounter (Signed)
Practice Administrator with [facility] has consulted with our practice administrator, Faythe Dingwall and followed up with patient.  Patient is aware that Dr.Yates will not prescribe pain medications as he is not post op and the need for medication is not warranted. Patient was referred to [facility] for a second opinion. Patient was offered to be referred to a pain management specialist, in which he has refused at this time.  No further action is required at this time.

## 2021-05-12 NOTE — Telephone Encounter (Signed)
After speaking with Alyse Low, patient has talked with Otila Kluver and I do not need to return call to patient.

## 2021-05-12 NOTE — Telephone Encounter (Signed)
Noted  

## 2021-05-12 NOTE — Telephone Encounter (Signed)
Patient called to check the status of his message and to let me know he is at dialysis this morning. Patient states I need to call him on his mobile with Dinah's reply.

## 2021-05-12 NOTE — Telephone Encounter (Signed)
I placed a call to Dr.Yates office per verbal conversation that I had with Marlowe Sax, NP to get more information and confirm reasoning for patient being directed to call his PCP. Per Dinah if the specialist thinks pain medication is appropriate they will need to prescribe.  After a 10 min hold, I had to end call to attend other clinical duties. I will route this message to Dr.Yates.

## 2021-05-13 ENCOUNTER — Telehealth: Payer: Self-pay

## 2021-05-13 ENCOUNTER — Other Ambulatory Visit: Payer: Self-pay

## 2021-05-13 ENCOUNTER — Other Ambulatory Visit: Payer: Medicare Other

## 2021-05-13 DIAGNOSIS — Z79899 Other long term (current) drug therapy: Secondary | ICD-10-CM | POA: Diagnosis not present

## 2021-05-13 LAB — LIPID PANEL
Chol/HDL Ratio: 3.5 ratio (ref 0.0–5.0)
Cholesterol, Total: 111 mg/dL (ref 100–199)
HDL: 32 mg/dL — ABNORMAL LOW (ref >39)
LDL Chol Calc (NIH): 58 mg/dL (ref 0–99)
Triglycerides: 111 mg/dL (ref 0–149)
VLDL Cholesterol Cal: 21 mg/dL (ref 5–40)

## 2021-05-13 LAB — HEPATIC FUNCTION PANEL
ALT: 12 IU/L (ref 0–44)
AST: 17 IU/L (ref 0–40)
Albumin: 4 g/dL (ref 3.7–4.7)
Alkaline Phosphatase: 63 IU/L (ref 44–121)
Bilirubin Total: 0.3 mg/dL (ref 0.0–1.2)
Bilirubin, Direct: 0.16 mg/dL (ref 0.00–0.40)
Total Protein: 7.2 g/dL (ref 6.0–8.5)

## 2021-05-13 NOTE — Telephone Encounter (Addendum)
Patient called to say how disappointed he is in his care from our office. Patient states " I have talked to [place] and [name], until I am blue in the face about how much pain I am in and they are not helping me."  I informed patient that they did acknowledge his pain and that is why he was referred to the specialist. If the specialist does not see a need for oxycodone [name] nor Webb Silversmith will override their expertise.   Patient plans to leave our practice in September and he would like to make [name] aware of this

## 2021-05-13 NOTE — Telephone Encounter (Signed)
Noted  

## 2021-05-14 DIAGNOSIS — N2581 Secondary hyperparathyroidism of renal origin: Secondary | ICD-10-CM | POA: Diagnosis not present

## 2021-05-14 DIAGNOSIS — E1122 Type 2 diabetes mellitus with diabetic chronic kidney disease: Secondary | ICD-10-CM | POA: Diagnosis not present

## 2021-05-14 DIAGNOSIS — Z992 Dependence on renal dialysis: Secondary | ICD-10-CM | POA: Diagnosis not present

## 2021-05-14 DIAGNOSIS — N186 End stage renal disease: Secondary | ICD-10-CM | POA: Diagnosis not present

## 2021-05-14 DIAGNOSIS — D631 Anemia in chronic kidney disease: Secondary | ICD-10-CM | POA: Diagnosis not present

## 2021-05-14 DIAGNOSIS — D509 Iron deficiency anemia, unspecified: Secondary | ICD-10-CM | POA: Diagnosis not present

## 2021-05-14 NOTE — Progress Notes (Signed)
Pt has been made aware of normal result and verbalized understanding.  jw

## 2021-05-17 ENCOUNTER — Telehealth: Payer: Self-pay | Admitting: Specialist

## 2021-05-17 ENCOUNTER — Telehealth: Payer: Self-pay | Admitting: Gastroenterology

## 2021-05-17 DIAGNOSIS — N2581 Secondary hyperparathyroidism of renal origin: Secondary | ICD-10-CM | POA: Diagnosis not present

## 2021-05-17 DIAGNOSIS — E1122 Type 2 diabetes mellitus with diabetic chronic kidney disease: Secondary | ICD-10-CM | POA: Diagnosis not present

## 2021-05-17 DIAGNOSIS — Z992 Dependence on renal dialysis: Secondary | ICD-10-CM | POA: Diagnosis not present

## 2021-05-17 DIAGNOSIS — N186 End stage renal disease: Secondary | ICD-10-CM | POA: Diagnosis not present

## 2021-05-17 DIAGNOSIS — D509 Iron deficiency anemia, unspecified: Secondary | ICD-10-CM | POA: Diagnosis not present

## 2021-05-17 DIAGNOSIS — D631 Anemia in chronic kidney disease: Secondary | ICD-10-CM | POA: Diagnosis not present

## 2021-05-17 NOTE — Telephone Encounter (Signed)
Recently discharged from [facility] for GI bleed and small bowel AVM.  Per discharge notes need to consider octreotide.  Patient will come in tomorrow at 4:00 to discuss with Dr. Havery Moros.

## 2021-05-17 NOTE — Telephone Encounter (Signed)
Patient calling to follow up after ED discharge

## 2021-05-17 NOTE — Telephone Encounter (Signed)
Patient called. Says he missed a call from [place]. Would like a call back. [phone]

## 2021-05-17 NOTE — Telephone Encounter (Signed)
I called him back, I was able to move his appt to 06/03/21 @ 10:30

## 2021-05-18 ENCOUNTER — Other Ambulatory Visit (INDEPENDENT_AMBULATORY_CARE_PROVIDER_SITE_OTHER): Payer: Medicare Other

## 2021-05-18 ENCOUNTER — Ambulatory Visit (INDEPENDENT_AMBULATORY_CARE_PROVIDER_SITE_OTHER): Payer: Medicare Other | Admitting: Gastroenterology

## 2021-05-18 ENCOUNTER — Encounter: Payer: Self-pay | Admitting: Gastroenterology

## 2021-05-18 ENCOUNTER — Other Ambulatory Visit: Payer: Self-pay

## 2021-05-18 VITALS — BP 114/50 | HR 80 | Ht 71.5 in | Wt 177.4 lb

## 2021-05-18 DIAGNOSIS — Q2730 Arteriovenous malformation, site unspecified: Secondary | ICD-10-CM | POA: Diagnosis not present

## 2021-05-18 DIAGNOSIS — N186 End stage renal disease: Secondary | ICD-10-CM

## 2021-05-18 DIAGNOSIS — Z992 Dependence on renal dialysis: Secondary | ICD-10-CM

## 2021-05-18 DIAGNOSIS — D649 Anemia, unspecified: Secondary | ICD-10-CM | POA: Diagnosis not present

## 2021-05-18 DIAGNOSIS — K921 Melena: Secondary | ICD-10-CM

## 2021-05-18 LAB — HEMOGLOBIN: Hemoglobin: 10.2 g/dL — ABNORMAL LOW (ref 13.0–17.0)

## 2021-05-18 MED ORDER — OCTREOTIDE ACETATE 10 MG IM KIT: 10.0000 mg | PACK | INTRAMUSCULAR | 3 refills | Status: DC

## 2021-05-18 NOTE — Progress Notes (Signed)
Script resent to pharmacy 

## 2021-05-18 NOTE — Progress Notes (Signed)
Script for octreotide failed to go through as e-scribed to pharmacy.  Script printed and faxed.

## 2021-05-18 NOTE — Telephone Encounter (Signed)
Thank you for the update!

## 2021-05-18 NOTE — Patient Instructions (Addendum)
If you are age 72 or older, your body mass index should be between 23-30. Your Body mass index is 24.39 kg/m. If this is out of the aforementioned range listed, please consider follow up with your Primary Care Provider.  If you are age 33 or younger, your body mass index should be between 19-25. Your Body mass index is 24.39 kg/m. If this is out of the aformentioned range listed, please consider follow up with your Primary Care Provider.   __________________________________________________________  The [facility] providers would like to encourage you to use [facility] to communicate with providers for non-urgent requests or questions.  Due to long hold times on the telephone, sending your provider a message by [facility] may be a faster and more efficient way to get a response.  Please allow 48 business hours for a response.  Please remember that this is for non-urgent requests.    Please go to the lab in the basement of our building to have lab work done as you leave today. Hit "B" for basement when you get on the elevator.  When the doors open the lab is on your left.  We will call you with the results. Thank you.   We have sent the following medications to your pharmacy for you to pick up at your convenience: Octreotide depot LAR (Sandostatin) 10 mg: Inject 10 mg IM every 28 days If approved by insurance we will schedule you for a nurse visit to be instructed on how to administer   Thank you for entrusting me with your care and for choosing [employer], Dr. Pacolet Cellar

## 2021-05-18 NOTE — Progress Notes (Signed)
HPI :  70 year old male with a complicated medical history significant for CAD status post CABG, ischemic cardiomyopathy, diabetes, HIV, end-stage renal disease on HD, COPD, recurrent GI bleeding due to AVMs, here for follow-up visit after hospitalization in mid July for recurrent anemia.  It appears he is a patient of Dr. Scarlette Shorts in our practice, I had an opening for an acute visit today and he was scheduled with me.  As below he has had recurrent upper GI bleeding and anemia due to AVMs in his stomach and small intestine.  He unfortunately has had 6 upper endoscopy/enteroscopy since December 2021.  Admitted in December, March, April, and most recently in July for similar issues.  Dr. Carlean Purl was most recently consulted on the case and I helped out with push enteroscopy during his admission.  He had 19 AVMs localized in the stomach and duodenum which were ablated.  He tolerated it well.  Has not had any recurrent dark stools.  Passing brown stools.  He had a hemoglobin checked 1 week ago he states it was 8.3, up slightly from hospitalization.  He inquires about how to prevent future episodes of bleeding and to minimize his risk for hospitalization we discussed options.  He is not using any NSAIDs.  He has stopped his aspirin at home.  He is not on any anticoagulation.  He generally is feeling well at this time, received IV iron at dialysis at his last session.  He is using only Tylenol as needed for aches pains.    PREVIOUS ENDOSCOPIC EVALUATIONS / PERTINENT STUDIES    10/19/20 EGD for GI bleed Gastric AVMs s/p APC treatment. - No specimens collected   12/22/19 EGD --bleeding gastric AVMs   April 2020 EGD --Gastric and duodenal bulb AVMs   Dec 2013 screening colonoscopy --moderate diverticulosis --2 mm polyp ( removed but not retrieved)   December 22, 2020 EGD for melena Blood in the duodenal bulb. - Nodule found in the duodenum. steady non-pulsatile bleeding seen at base, very close to  distal aspect pylorus so positioning difficult - ? AVM Treated with argon plasma coagulation (APC) - partially successful Clips (MR conditional) were placed. hemostatic spray applied. No bleeding at end - The examination was otherwise normal.   January 09, 2021 EGD for  GI bleed -  Bleeding gastric AVMs status post successful APC therapy -- Otherwise unremarkable upper endoscopy   January 25, 2021 EGD for anemia / melena -Normal esophagus. - Gastritis. Biopsied. - A few bleeding angioectasias in the duodenum. Treated with argon plasma coagulation (APC). - Nodular mucosa in the duodenal bulb and in the first portion of the duodenum. - The examined portion of the jejunum was normal.   February 13, 2021 EGD - gastric AVM s/p APV, actively bleeding duodenal AVM s/p APC  July 15/2022 Enteroscopy -  Small hiatal hernia. - 15 non-bleeding angiodysplastic lesions in the stomach. Treated with argon plasma coagulation (APC). - Four non-bleeding angiodysplastic lesions in the duodenum. Treated with argon plasma coagulation (APC). - Nodular mucosa in the duodenal bulb. - The examined portion of the jejunum was normal.   05/02/21 Non con CTAP IMPRESSION: 1. No acute abnormality. 2. Extensive, severe calcified atherosclerotic changes, as described above. These include the coronary arteries, superior mesenteric artery and renal arteries. 3. Sigmoid colon diverticulosis. 4. Stable cardiomegaly.    Echo 10/08/20 - EF 40-45%, grade II DD     Past Medical History:  Diagnosis Date   Acute respiratory failure ([place]) 03/01/2018  Anemia    Arthritis    "all over; mostly knees and back" (02/28/2018)   Chronic combined systolic and diastolic CHF (congestive heart failure) (HCC)    Chronic lower back pain    stenosis   Community acquired pneumonia 09/06/2013   COPD (chronic obstructive pulmonary disease) ([place])    Coronary atherosclerosis of native coronary artery    a. 02/2003 s/p CABG x 2 (VG->RI,  VG->RPDA; b. 11/2019 PCI: LM nl, LAD 90d, D3 50, RI 100, LCX 100p, OM3 100 - fills via L->L collats from D2/dLAD, RCA 100p, VG->RPDA ok, VG->RI 95 (3.5x48 Synergy XD DES).   Drug abuse ([place])    hx; tested for cocaine as recently as 2/08. says he is not using drugs now - avoided defib. for this reason    ESRD (end stage renal disease) ([place])    Hemo M-W-F- [facility]   Fall at home 10/2020   GERD (gastroesophageal reflux disease)    takes OTC meds as needed   GI bleeding    a. 11/2019 EGD: angiodysplastic lesions w/ bleeding s/p argon plasma/clipping/epi inj. Multiple admissions for the same.   Glaucoma    uses eye drops daily   Hepatitis B 1968   "tx'd w/isolation; caught it from toilet stools in gym"   History of blood transfusion 03/01/2019   History of colon polyps    benign   History of gout    takes Allopurinol daily as well as Colchicine-if needed (02/28/2018)   History of kidney stones    HTN (hypertension)    takes Coreg,Imdur.and Apresoline daily   Human immunodeficiency virus (HIV) disease ([place]) dx'd 1995   on [place] as of 12/2020.     Hyperlipidemia    Ischemic cardiomyopathy    a. 01/2019 Echo: EF 40-45%, diffuse HK, mild basal septal hypertrophy. Diast dysfxn. Nl RV size/fxn. Sev dil LA. Triv MR/TR/PR.   Muscle spasm    takes Zanaflex as needed   Myocardial infarction ([place]) ~ 2004/2005   Nocturia    Peripheral neuropathy    takes gabapentin daily   Pneumonia    "at least twice" (02/28/2018)   SDH (subdural hematoma) (HCC)    Syphilis, unspecified    Type II diabetes mellitus ([place]) 2004   Lantus daily.Average fasting blood sugar 125-199   Wears glasses    Wears partial dentures      Past Surgical History:  Procedure Laterality Date   AV FISTULA PLACEMENT Left 08/02/2018   Procedure: ARTERIOVENOUS (AV) FISTULA CREATION  left arm radiocephlic;  Surgeon: Marty Heck, MD;  Location: [place];  Service: Vascular;  Laterality: Left;   AV FISTULA PLACEMENT Left  08/01/2019   Procedure: LEFT BRACHIOCEPHALIC ARTERIOVENOUS (AV) FISTULA CREATION;  Surgeon: [name], MD;  Location: [place];  Service: Vascular;  Laterality: Left;   [place] Left 10/03/2019   Procedure: BASILIC VEIN TRANSPOSITION LEFT SECOND STAGE;  Surgeon: [name], MD;  Location: [place];  Service: Vascular;  Laterality: Left;   BIOPSY  01/25/2021   Procedure: BIOPSY;  Surgeon: Doran Stabler, MD;  Location: [facility] ENDOSCOPY;  Service: Gastroenterology;;   CARDIAC CATHETERIZATION  10/2002; 12/19/2004   [name] 03/08/2011   COLONOSCOPY  2013   [place]    CORONARY ARTERY BYPASS GRAFT  02/24/2003   CABG X2/notes 03/08/2011   CORONARY STENT INTERVENTION N/A 12/19/2019   Procedure: CORONARY STENT INTERVENTION;  Surgeon: Jettie Booze, MD;  Location: [place] [facility];  Service: Cardiovascular;  Laterality:  N/A;   ENTEROSCOPY N/A 01/25/2021   Procedure: ENTEROSCOPY;  Surgeon: Doran Stabler, MD;  Location: [place];  Service: Gastroenterology;  Laterality: N/A;   ENTEROSCOPY N/A 02/13/2021   Procedure: ENTEROSCOPY;  Surgeon: Jackquline Denmark, MD;  Location: [facility] ENDOSCOPY;  Service: Endoscopy;  Laterality: N/A;   ENTEROSCOPY N/A 05/07/2021   Procedure: ENTEROSCOPY;  Surgeon: Yetta Flock, MD;  Location: [facility] ENDOSCOPY;  Service: Gastroenterology;  Laterality: N/A;   ESOPHAGOGASTRODUODENOSCOPY (EGD) WITH PROPOFOL N/A 02/08/2019   Procedure: ESOPHAGOGASTRODUODENOSCOPY (EGD) WITH PROPOFOL;  Surgeon: Milus Banister, MD;  Location: [place];  Service: Gastroenterology;  Laterality: N/A;   ESOPHAGOGASTRODUODENOSCOPY (EGD) WITH PROPOFOL N/A 12/22/2019   Procedure: ESOPHAGOGASTRODUODENOSCOPY (EGD) WITH PROPOFOL;  Surgeon: Lavena Bullion, DO;  Location: [place];  Service: Gastroenterology;  Laterality: N/A;   ESOPHAGOGASTRODUODENOSCOPY (EGD) WITH PROPOFOL N/A 10/19/2020   Procedure: ESOPHAGOGASTRODUODENOSCOPY (EGD) WITH PROPOFOL;  Surgeon: Jackquline Denmark, MD;  Location: [facility]OSCOPY;  Service: Endoscopy;  Laterality: N/A;   ESOPHAGOGASTRODUODENOSCOPY (EGD) WITH PROPOFOL N/A 12/22/2020   Procedure: ESOPHAGOGASTRODUODENOSCOPY (EGD) WITH PROPOFOL;  Surgeon: Gatha Mayer, MD;  Location: [place];  Service: Endoscopy;  Laterality: N/A;   ESOPHAGOGASTRODUODENOSCOPY (EGD) WITH PROPOFOL N/A 01/09/2021   Procedure: ESOPHAGOGASTRODUODENOSCOPY (EGD) WITH PROPOFOL;  Surgeon: Irene Shipper, MD;  Location: [facility] ENDOSCOPY;  Service: Endoscopy;  Laterality: N/A;   HEMOSTASIS CLIP PLACEMENT  12/22/2019   Procedure: HEMOSTASIS CLIP PLACEMENT;  Surgeon: Lavena Bullion, DO;  Location: [place] ENDOSCOPY;  Service: Gastroenterology;;   HEMOSTASIS CLIP PLACEMENT  12/22/2020   Procedure: HEMOSTASIS CLIP PLACEMENT;  Surgeon: Gatha Mayer, MD;  Location: [facility] ENDOSCOPY;  Service: Endoscopy;;   HEMOSTASIS CONTROL  12/22/2020   Procedure: HEMOSTASIS CONTROL/hemospray;  Surgeon: Gatha Mayer, MD;  Location: [place];  Service: Endoscopy;;   HOT HEMOSTASIS N/A 02/08/2019   Procedure: HOT HEMOSTASIS (ARGON PLASMA COAGULATION/BICAP);  Surgeon: Milus Banister, MD;  Location: [facility] ENDOSCOPY;  Service: Gastroenterology;  Laterality: N/A;   HOT HEMOSTASIS N/A 12/22/2019   Procedure: HOT HEMOSTASIS (ARGON PLASMA COAGULATION/BICAP);  Surgeon: Lavena Bullion, DO;  Location: [facility] ENDOSCOPY;  Service: Gastroenterology;  Laterality: N/A;   HOT HEMOSTASIS N/A 10/19/2020   Procedure: HOT HEMOSTASIS (ARGON PLASMA COAGULATION/BICAP);  Surgeon: Jackquline Denmark, MD;  Location: [facility] ENDOSCOPY;  Service: Endoscopy;  Laterality: N/A;   HOT HEMOSTASIS N/A 12/22/2020   Procedure: HOT HEMOSTASIS (ARGON PLASMA COAGULATION/BICAP);  Surgeon: Gatha Mayer, MD;  Location: [facility];  Service: Endoscopy;  Laterality: N/A;   HOT HEMOSTASIS N/A 01/09/2021   Procedure: HOT HEMOSTASIS (ARGON PLASMA COAGULATION/BICAP);  Surgeon: Irene Shipper, MD;  Location: [facility] ENDOSCOPY;  Service: Endoscopy;   Laterality: N/A;   HOT HEMOSTASIS N/A 01/25/2021   Procedure: HOT HEMOSTASIS (ARGON PLASMA COAGULATION/BICAP);  Surgeon: Doran Stabler, MD;  Location: [place];  Service: Gastroenterology;  Laterality: N/A;   HOT HEMOSTASIS N/A 02/13/2021   Procedure: HOT HEMOSTASIS (ARGON PLASMA COAGULATION/BICAP);  Surgeon: Jackquline Denmark, MD;  Location: [facility] ENDOSCOPY;  Service: Endoscopy;  Laterality: N/A;   HOT HEMOSTASIS N/A 05/07/2021   Procedure: HOT HEMOSTASIS (ARGON PLASMA COAGULATION/BICAP);  Surgeon: Yetta Flock, MD;  Location: [facility] ENDOSCOPY;  Service: Gastroenterology;  Laterality: N/A;   INTERTROCHANTERIC HIP FRACTURE SURGERY Left 11/2006   [name] 03/08/2011   INTRAVASCULAR ULTRASOUND/IVUS N/A 12/19/2019   Procedure: Intravascular Ultrasound/IVUS;  Surgeon: Jettie Booze, MD;  Location: [place] CV LAB;  Service: Cardiovascular;  Laterality: N/A;   IR FLUORO GUIDE CV LINE RIGHT  07/24/2019  IR FLUORO GUIDE CV LINE RIGHT  07/30/2019   IR US GUIDE VASC ACCESS RIGHT  07/24/2019   IR US GUIDE VASC ACCESS RIGHT  07/30/2019   LAPAROSCOPIC CHOLECYSTECTOMY  05/2006   LIGATION OF COMPETING BRANCHES OF ARTERIOVENOUS FISTULA Left 11/05/2018   Procedure: LIGATION OF COMPETING BRANCHES OF ARTERIOVENOUS FISTULA  LEFT  ARM;  Surgeon: Marty Heck, MD;  Location[facility] OR;  Service: Vascular;  Laterality: Left;   LUMBAR LAMINECTOMY/DECOMPRESSION MICRODISCECTOMY N/A 02/29/2016   Procedure: Left L4-5 Lateral Recess Decompression, Removal Extradural Intraspinal Facet Cyst;  Surgeon: Marybelle Killings, MD;  Location: [place];  Service: Orthopedics;  Laterality: N/A;   MULTIPLE TOOTH EXTRACTIONS     ORIF MANDIBULAR FRACTURE Left 08/13/2004   ORIF of left body fracture mandible with KLS Martin 2.3-mm six hole/notes 03/08/2011   RIGHT/LEFT HEART CATH AND CORONARY/GRAFT ANGIOGRAPHY N/A 12/19/2019   Procedure: RIGHT/LEFT HEART CATH AND CORONARY/GRAFT ANGIOGRAPHY;  Surgeon: Jettie Booze, MD;  Location: [place] CV LAB;  Service: Cardiovascular;  Laterality: N/A;   SCLEROTHERAPY  12/22/2019   Procedure: SCLEROTHERAPY;  Surgeon: Lavena Bullion, DO;  Location: [facility] ENDOSCOPY;  Service: Gastroenterology;;   Clide Deutscher  02/13/2021   Procedure: Clide Deutscher;  Surgeon: Jackquline Denmark, MD;  Location: [facility] ENDOSCOPY;  Service: Endoscopy;;   Family History  Problem Relation Age of Onset   Heart failure Father    Hypertension Father    Diabetes Brother    Heart attack Brother    Alzheimer's disease Mother    Stroke Sister    Diabetes Sister    Alzheimer's disease Sister    Hypertension Brother    Diabetes Brother    Drug abuse Brother    Colon cancer Neg Hx    Social History   Tobacco Use   Smoking status: Every Day    Packs/day: 0.50    Years: 43.00    Pack years: 21.50    Types: Cigarettes   Smokeless tobacco: Never  Vaping Use   Vaping Use: Never used  Substance Use Topics   Alcohol use: Not Currently    Alcohol/week: 12.0 standard drinks    Types: 12 Standard drinks or equivalent per week    Comment: occassional   Drug use: Not Currently    Types: Cocaine    Comment: hx of crack/cocaine 57yrs ago 10/01/2019- none   Current Outpatient Medications  Medication Sig Dispense Refill   acetaminophen (TYLENOL) 650 MG CR tablet Take 1,300 mg by mouth 3 (three) times daily.     allopurinol (ZYLOPRIM) 100 MG tablet TAKE 1 TABLET(100 MG) BY MOUTH DAILY 90 tablet 1   ANORO ELLIPTA 62.5-25 MCG/INH AEPB INHALE 1 PUFF BY MOUTH EVERY DAY 60 each 5   atorvastatin (LIPITOR) 10 MG tablet Take 1 tablet (10 mg total) by mouth daily. 90 tablet 3   B-D UF III MINI PEN NEEDLES 31G X 5 MM MISC USE FOUR TIMES DAILY 100 each 11   bictegravir-emtricitabine-tenofovir AF (BIKTARVY) 50-200-25 MG TABS tablet Take 1 tablet by mouth daily. 30 tablet 11   Calcium Acetate 667 MG TABS Take 667-1,334 mg by mouth See admin instructions. Take 1,334 mg by mouth three times a day with meals and 667 mg with each  snack     carvedilol (COREG) 3.125 MG tablet TAKE 1 TABLET(3.125 MG) BY MOUTH TWICE DAILY 180 tablet 3   colchicine 0.6 MG tablet TAKE 1 TABLET BY MOUTH AS NEEDED FOR GOUT 30 tablet 1   diclofenac Sodium (VOLTAREN) 1 %  GEL APPLY 2 GRAMS TOPICALLY TO THE AFFECTED AREA THREE TIMES DAILY AS NEEDED FOR PAIN 892 g 3   folic acid (FOLVITE) 1 MG tablet Take 1 tablet (1 mg total) by mouth daily. 90 tablet 1   gabapentin (NEURONTIN) 300 MG capsule Take 1 capsule (300 mg total) by mouth daily. Dose change due to renal function 90 capsule 1   glucose blood (ONETOUCH ULTRA) test strip Check blood sugar three times daily. Dx:E11.40 300 strip 3   insulin lispro (HUMALOG) 100 UNIT/ML KwikPen INJECT 13 UNITS UNDER THE SKIN THREE TIMES DAILY AS NEEDED FOR HIGH BLOOD SUGAR(ABOVE 150) 45 mL 1   isosorbide mononitrate (IMDUR) 30 MG 24 hr tablet TAKE 1 TABLET BY MOUTH EVERY DAY 90 tablet 1   Lancets (ONETOUCH DELICA PLUS JJHERD40C) MISC Inject 1 Device as directed 3 (three) times daily. Dx: E11.40 300 each 3   latanoprost (XALATAN) 0.005 % ophthalmic solution Place 1 drop into both eyes at bedtime.     lidocaine-prilocaine (EMLA) cream Apply 1 application topically every Monday, Wednesday, and Friday with hemodialysis.     Methoxy PEG-Epoetin Beta (MIRCERA IJ) Mircera     multivitamin (RENA-VIT) TABS tablet Take 1 tablet by mouth daily.     oxyCODONE-acetaminophen (PERCOCET/ROXICET) 5-325 MG tablet Take 1 tablet by mouth daily as needed for severe pain.     pantoprazole (PROTONIX) 40 MG tablet Take 1 tablet (40 mg total) by mouth 2 (two) times daily before a meal. 60 tablet 2   polyethylene glycol (MIRALAX / GLYCOLAX) 17 g packet Take 17 g by mouth daily as needed for mild constipation. 14 each 0   timolol (TIMOPTIC) 0.5 % ophthalmic solution Place 1 drop into both eyes in the morning.     nitroGLYCERIN (NITROSTAT) 0.3 MG SL tablet PLACE 1 TABLET UNDER THE TONGUE AS NEEDED FOR CHEST PAIN (Patient not taking: Reported  on 05/18/2021) 75 tablet 2   No current facility-administered medications for this visit.   Allergies  Allergen Reactions   Augmentin [Amoxicillin-Pot Clavulanate] Diarrhea and Other (See Comments)    Severe diarrhea   Mucinex Fast-Max Other (See Comments)    Intense sweating    Amphetamines Other (See Comments)    Unknown reaction     Review of Systems: All systems reviewed and negative except where noted in HPI.    CT Abdomen Pelvis Wo Contrast  Result Date: 05/02/2021 CLINICAL DATA:  Epigastric and diffuse abdominal pain. Episode of vomiting this morning. Recently diagnosed with H. pylori infection. EXAM: CT ABDOMEN AND PELVIS WITHOUT CONTRAST TECHNIQUE: Multidetector CT imaging of the abdomen and pelvis was performed following the standard protocol without IV contrast. COMPARISON:  02/15/2021 FINDINGS: Lower chest: Stable enlarged heart. Dense coronary artery calcifications. Post CABG changes. Clear lung bases. Hepatobiliary: No focal liver abnormality is seen. Status post cholecystectomy. No biliary dilatation. Small amount of interval biliary air. Pancreas: Unremarkable. No pancreatic ductal dilatation or surrounding inflammatory changes. Spleen: Normal in size without focal abnormality. Adrenals/Urinary Tract: Unremarkable adrenal glands. Dense bilateral renal vascular calcifications with no definite calculi seen. Small right renal cysts, including a small hemorrhagic or proteinaceous cyst. Unremarkable ureters and urinary bladder. Stomach/Bowel: Multiple sigmoid colon diverticula without evidence of diverticulitis. Normal appearing appendix. Unremarkable stomach and small bowel. Vascular/Lymphatic: Extensive dense atheromatous arterial calcifications, including severe calcifications throughout the proximal superior mesenteric artery and both proximal renal arteries. No aneurysm seen. No enlarged lymph nodes. Reproductive: Minimally enlarged prostate gland. Other: Moderate left and minimal  right inguinal hernias containing  fat. Musculoskeletal: Left hip fixation hardware. Old, healed bilateral rib fractures. Lumbar and lower thoracic spine degenerative changes. IMPRESSION: 1. No acute abnormality. 2. Extensive, severe calcified atherosclerotic changes, as described above. These include the coronary arteries, superior mesenteric artery and renal arteries. 3. Sigmoid colon diverticulosis. 4. Stable cardiomegaly. Electronically Signed   By: Claudie Revering M.D.   On: 05/02/2021 12:42   DG Chest Portable 1 View  Result Date: 05/03/2021 CLINICAL DATA:  Vomiting and blood in stool, initial encounter EXAM: PORTABLE CHEST 1 VIEW COMPARISON:  03/04/2021 FINDINGS: Cardiac shadow is enlarged. Postsurgical changes are again seen. Aortic calcifications are noted. The lungs are clear bilaterally. Chronic rib fractures on the right are noted. IMPRESSION: No acute abnormality noted. Electronically Signed   By: Inez Catalina M.D.   On: 05/03/2021 01:15   CT Angio Abd/Pel w/ and/or w/o  Result Date: 05/05/2021 CLINICAL DATA:  anemia. History of same. States he has continued to have bleeding. Recent admission for the same but then left AMA. History of GI bleeds. History of HIV. History of dialysis. In the morning and was sent back to the ER. States he has had black stool and blood visible. States he is feeling little more confused. EXAM: CTA ABDOMEN AND PELVIS WITHOUT AND WITH CONTRAST TECHNIQUE: Multidetector CT imaging of the abdomen and pelvis was performed using the standard protocol during bolus administration of intravenous contrast. Multiplanar reconstructed images and MIPs were obtained and reviewed to evaluate the vascular anatomy. CONTRAST:  187mL OMNIPAQUE IOHEXOL 350 MG/ML SOLN COMPARISON:  None. FINDINGS: VASCULAR Aorta: Moderate calcified atheromatous plaque. No aneurysm, dissection, or stenosis. Celiac: Partially calcified ostial plaque without significant stenosis, patent distally with unremarkable  distal branch anatomy. SMA: Heavily calcified plaque extending from the ostium 4 cm, resulting in at least moderate stenosis, atheromatous but patent distally. Renals: Single bilaterally, both with calcified ostial plaque resulting in stenosis of at indeterminate severity. IMA: Patent without evidence of aneurysm, dissection, vasculitis or significant stenosis. Inflow: Moderate calcified plaque. Mild stenosis in the left common iliac artery. Proximal stenosis in bilateral internal iliac arteries. External iliac arteries patent. Proximal Outflow: Atheromatous, patent Veins: Patent hepatic veins, portal vein, SM V, splenic vein, bilateral renal veins, iliac venous system and IVC. Review of the MIP images confirms the above findings. NON-VASCULAR Lower chest: Sternotomy wires. No pleural or pericardial effusion. Coronary calcifications. Hepatobiliary: No focal liver abnormality is seen. Status post cholecystectomy. No biliary dilatation. Pancreas: Unremarkable. No pancreatic ductal dilatation or surrounding inflammatory changes. Spleen: Normal in size without focal abnormality. Adrenals/Urinary Tract: adrenal glands unremarkable. Small bilateral renal cysts. Calcifications in bilateral renal pelves predominately arterial. No hydronephrosis or ureterectasis. Urinary bladder is incompletely distended. Stomach/Bowel: Stomach is nondistended. Small bowel decompressed. Normal appendix. The colon is nondilated. A few scattered sigmoid diverticula. No evidence of active extravasation. Lymphatic: No abdominal or pelvic adenopathy. Reproductive: Prostate is unremarkable. Other: No ascites.  No free air. Musculoskeletal: Spondylitic changes in the lower lumbar spine. IM rod with sliding screw transfix left femoral neck. No acute fracture or worrisome bone lesion. IMPRESSION: 1. No evidence of active GI bleeding. 2. Scattered sigmoid diverticula 3. Coronary and aortoiliac atherosclerosis (ICD10-170.0) involving visceral and renal  branches. Electronically Signed   By: Lucrezia Europe M.D.   On: 05/05/2021 14:30   Lab Results  Component Value Date   WBC 4.4 05/07/2021   HGB 10.2 (L) 05/18/2021   HCT 24.5 (L) 05/07/2021   MCV 92.1 05/07/2021   PLT 84 (L) 05/07/2021  Lab Results  Component Value Date   CREATININE 7.71 (H) 05/07/2021   BUN 79 (H) 05/07/2021   NA 134 (L) 05/07/2021   K 4.0 05/07/2021   CL 98 05/07/2021   CO2 26 05/07/2021    Lab Results  Component Value Date   ALT 12 05/13/2021   AST 17 05/13/2021   ALKPHOS 63 05/13/2021   BILITOT 0.3 05/13/2021     Physical Exam: BP (!) 114/50 (BP Location: Right Arm, Patient Position: Sitting, Cuff Size: Normal)   Pulse 80   Ht 5' 11.5" (1.816 m) Comment: height measured without shoes  Wt 177 lb 6 oz (80.5 kg)   BMI 24.39 kg/m  Constitutional: Pleasant,well-developed, male in no acute distress. Neurological: Alert and oriented to person place and time. Psychiatric: Normal mood and affect. Behavior is normal.   ASSESSMENT AND PLAN: 70 year old male here for reassessment following:  Gastric and small bowel AVMs Recurrent GI bleeding Anemia End-stage renal disease on HD  As above, the patient has had 6 upper endoscopies for recurrent bleeding/anemia all showing gastric and/or duodenal AVMs and has undergone ablation.  His last exam showed the highest burden of these lesions.  He has appears to have responded well to therapy and is passing brown stool and no further clinical bleeding.  His hemoglobin has been uptrending.  We discussed that his risk for this is being on dialysis and he is likely to continue to recur and have more AVMs.  We discussed options for trying to break this cycle and keep him out of the hospital.  Medically we could consider giving him octreotide Depo injections, 10 mg IM every 4 weeks (renally dosed given HD), although unclear sure if this is covered by insurance and this could be very expensive for him.  If this is not covered  or affordable, could consider preventative endoscopies at the hospital with APC ablation to stay ahead of the bleeding and try to keep him out of the hospital.  He would prefer medical therapy with octreotide if possible.  We will see how much this is for him, if not possible, will discuss if he wishes to have an endoscopy at the hospital in 2 months or so for APC ablation of AVMs.  He agrees with the plan.  It appears he belongs to Dr. Henrene Pastor of our office, nursing staff scheduled with me due to opening acutely in the schedule..  Plan: - Hgb today, ensure Hgb uptrending - will order Octreotide depot 10mg  IM every 4 weeks - discussed risks /benefits - will see if this is covered by insurance and how much this will be for him - if not covered, consider surveillance enteroscopy in 2 months at the hospital  Patient agreed with plan, all questions answered.  Jolly Mango, MD [facility]

## 2021-05-19 ENCOUNTER — Telehealth: Payer: Self-pay | Admitting: Gastroenterology

## 2021-05-19 DIAGNOSIS — E1122 Type 2 diabetes mellitus with diabetic chronic kidney disease: Secondary | ICD-10-CM | POA: Diagnosis not present

## 2021-05-19 DIAGNOSIS — Z992 Dependence on renal dialysis: Secondary | ICD-10-CM | POA: Diagnosis not present

## 2021-05-19 DIAGNOSIS — D631 Anemia in chronic kidney disease: Secondary | ICD-10-CM | POA: Diagnosis not present

## 2021-05-19 DIAGNOSIS — N2581 Secondary hyperparathyroidism of renal origin: Secondary | ICD-10-CM | POA: Diagnosis not present

## 2021-05-19 DIAGNOSIS — N186 End stage renal disease: Secondary | ICD-10-CM | POA: Diagnosis not present

## 2021-05-19 DIAGNOSIS — D509 Iron deficiency anemia, unspecified: Secondary | ICD-10-CM | POA: Diagnosis not present

## 2021-05-19 NOTE — Telephone Encounter (Signed)
SCRIPT REFAXED TO PHARMACY

## 2021-05-19 NOTE — Telephone Encounter (Signed)
Patient calling to inform pharmacy didn't receive script yesterday due to power outage in the area. Pt states pharmacy is requesting to resubmit script for med Sandostatin.. Plz advise Thank you

## 2021-05-19 NOTE — Telephone Encounter (Signed)
Patient called to inform you that the medication script was received however insurance is not covering it

## 2021-05-20 ENCOUNTER — Other Ambulatory Visit: Payer: Self-pay | Admitting: Nurse Practitioner

## 2021-05-20 DIAGNOSIS — E119 Type 2 diabetes mellitus without complications: Secondary | ICD-10-CM

## 2021-05-20 NOTE — Telephone Encounter (Signed)
Received Prior Authorization Form from [facility] for octreotide Sandostain LAR 10 mg injection q28days for AVMs, GI Bleed, anemia.  Form completed and faxed to [phone] along with office notes from 05-18-21 appointment with Dr. Havery Moros.  Called and informed patient.  He said insurance told him it may take up to 13 days for them to make a decision once form is submitted.

## 2021-05-21 DIAGNOSIS — D631 Anemia in chronic kidney disease: Secondary | ICD-10-CM | POA: Diagnosis not present

## 2021-05-21 DIAGNOSIS — Z992 Dependence on renal dialysis: Secondary | ICD-10-CM | POA: Diagnosis not present

## 2021-05-21 DIAGNOSIS — D509 Iron deficiency anemia, unspecified: Secondary | ICD-10-CM | POA: Diagnosis not present

## 2021-05-21 DIAGNOSIS — N2581 Secondary hyperparathyroidism of renal origin: Secondary | ICD-10-CM | POA: Diagnosis not present

## 2021-05-21 DIAGNOSIS — N186 End stage renal disease: Secondary | ICD-10-CM | POA: Diagnosis not present

## 2021-05-21 DIAGNOSIS — E1122 Type 2 diabetes mellitus with diabetic chronic kidney disease: Secondary | ICD-10-CM | POA: Diagnosis not present

## 2021-05-23 DIAGNOSIS — E1129 Type 2 diabetes mellitus with other diabetic kidney complication: Secondary | ICD-10-CM | POA: Diagnosis not present

## 2021-05-23 DIAGNOSIS — Z992 Dependence on renal dialysis: Secondary | ICD-10-CM | POA: Diagnosis not present

## 2021-05-23 DIAGNOSIS — N186 End stage renal disease: Secondary | ICD-10-CM | POA: Diagnosis not present

## 2021-05-24 DIAGNOSIS — N2581 Secondary hyperparathyroidism of renal origin: Secondary | ICD-10-CM | POA: Diagnosis not present

## 2021-05-24 DIAGNOSIS — D631 Anemia in chronic kidney disease: Secondary | ICD-10-CM | POA: Diagnosis not present

## 2021-05-24 DIAGNOSIS — N186 End stage renal disease: Secondary | ICD-10-CM | POA: Diagnosis not present

## 2021-05-24 DIAGNOSIS — Z992 Dependence on renal dialysis: Secondary | ICD-10-CM | POA: Diagnosis not present

## 2021-05-24 DIAGNOSIS — D509 Iron deficiency anemia, unspecified: Secondary | ICD-10-CM | POA: Diagnosis not present

## 2021-05-24 NOTE — Telephone Encounter (Signed)
Patient is requesting to speak with you regarding previous message.

## 2021-05-24 NOTE — Telephone Encounter (Signed)
Called and spoke to patient. He talked to [facility] and said they needed further clarification re: if the medication was a continuation of therapy and if the diagnosis was for carcinoid tumor.  Called and spoke to [facility].  Reviewed with the representative that the box  that read "check if a continuation of therapy" was NOT checked and therefore meant this is not a continuation of therapy.  The diagnoses along with their ICD-10 codes were provided and since "carcinoid tumor" was NOT listed, therefore this is not for the diagnosis of carcinoid tumor. The representative indicated that an Appeal would have to be made and faxed to [phone]. Expedited appeal submitted.

## 2021-05-24 NOTE — Telephone Encounter (Signed)
Called and let patient know that the PA has been denied for octreotide. He will call his insurance company to inquire.

## 2021-05-24 NOTE — Telephone Encounter (Signed)
Inbound call from pharmacy. States PA was denied.

## 2021-05-25 NOTE — Telephone Encounter (Signed)
Received a fax from [employer]. Completed additional form asking if patient has another diagnosis other than recurrent bleeding due to gastric and small bowel AVMs and states "this mediation is not FDA approved or supported by Medicare approved compendia for this condition".  On the list was Cardiomyopathy which the patient has.  Attached Cardiology Office note from 02-2021 for ischemic cardiomyopathy and faxed back to [place] at fax # [phone].

## 2021-05-26 DIAGNOSIS — D631 Anemia in chronic kidney disease: Secondary | ICD-10-CM | POA: Diagnosis not present

## 2021-05-26 DIAGNOSIS — N186 End stage renal disease: Secondary | ICD-10-CM | POA: Diagnosis not present

## 2021-05-26 DIAGNOSIS — D509 Iron deficiency anemia, unspecified: Secondary | ICD-10-CM | POA: Diagnosis not present

## 2021-05-26 DIAGNOSIS — N2581 Secondary hyperparathyroidism of renal origin: Secondary | ICD-10-CM | POA: Diagnosis not present

## 2021-05-26 DIAGNOSIS — Z992 Dependence on renal dialysis: Secondary | ICD-10-CM | POA: Diagnosis not present

## 2021-05-27 ENCOUNTER — Ambulatory Visit: Payer: Medicare Other | Admitting: Internal Medicine

## 2021-05-28 ENCOUNTER — Telehealth: Payer: Self-pay | Admitting: Gastroenterology

## 2021-05-28 DIAGNOSIS — Z992 Dependence on renal dialysis: Secondary | ICD-10-CM | POA: Diagnosis not present

## 2021-05-28 DIAGNOSIS — N186 End stage renal disease: Secondary | ICD-10-CM | POA: Diagnosis not present

## 2021-05-28 DIAGNOSIS — D509 Iron deficiency anemia, unspecified: Secondary | ICD-10-CM | POA: Diagnosis not present

## 2021-05-28 DIAGNOSIS — D631 Anemia in chronic kidney disease: Secondary | ICD-10-CM | POA: Diagnosis not present

## 2021-05-28 DIAGNOSIS — N2581 Secondary hyperparathyroidism of renal origin: Secondary | ICD-10-CM | POA: Diagnosis not present

## 2021-05-28 NOTE — Telephone Encounter (Signed)
Inbound call from patient. Have questions about injection site for medication octreotide. Best contact [phone]

## 2021-05-31 ENCOUNTER — Telehealth: Payer: Self-pay | Admitting: Gastroenterology

## 2021-05-31 ENCOUNTER — Other Ambulatory Visit: Payer: Self-pay

## 2021-05-31 DIAGNOSIS — Q2730 Arteriovenous malformation, site unspecified: Secondary | ICD-10-CM

## 2021-05-31 NOTE — Telephone Encounter (Signed)
See other phone note with [place], Therapist, sports

## 2021-05-31 NOTE — Telephone Encounter (Signed)
Spoke with patient, he has been advised that Octreotide will be an IM injection. Advised that we will place orders for [facility] and they will administer the medication for him every month. Patient would like appt on 06/10/21. Spoke with [name] at [facility] short stay, patient is scheduled for first Octreotide injection on Thursday, 06/10/21 at 12 PM. Patient is aware of the appt and advised that his next appt will be scheduled at the time of his visit. Pt is aware that he needs to take the medication with him. Patient verbalized understanding of all information and had no concerns at the end of the call.

## 2021-05-31 NOTE — Telephone Encounter (Signed)
Pt needs to schedule an injection appt. Pls call him.

## 2021-05-31 NOTE — Telephone Encounter (Signed)
Appeal has been approved. Patient has picked up his medication and plans for administration are being discussed.  See telephone note from 05-28-21. [place], RN spoke to patient this morning regarding arrangements.

## 2021-06-01 ENCOUNTER — Other Ambulatory Visit: Payer: Self-pay | Admitting: Internal Medicine

## 2021-06-01 ENCOUNTER — Encounter: Payer: Self-pay | Admitting: Podiatry

## 2021-06-01 ENCOUNTER — Telehealth: Payer: Self-pay

## 2021-06-01 ENCOUNTER — Ambulatory Visit (INDEPENDENT_AMBULATORY_CARE_PROVIDER_SITE_OTHER): Payer: Medicare Other | Admitting: Podiatry

## 2021-06-01 ENCOUNTER — Other Ambulatory Visit: Payer: Self-pay

## 2021-06-01 DIAGNOSIS — E0842 Diabetes mellitus due to underlying condition with diabetic polyneuropathy: Secondary | ICD-10-CM | POA: Diagnosis not present

## 2021-06-01 DIAGNOSIS — E08621 Diabetes mellitus due to underlying condition with foot ulcer: Secondary | ICD-10-CM | POA: Diagnosis not present

## 2021-06-01 DIAGNOSIS — L97511 Non-pressure chronic ulcer of other part of right foot limited to breakdown of skin: Secondary | ICD-10-CM

## 2021-06-01 DIAGNOSIS — B351 Tinea unguium: Secondary | ICD-10-CM

## 2021-06-01 DIAGNOSIS — A048 Other specified bacterial intestinal infections: Secondary | ICD-10-CM

## 2021-06-01 NOTE — Telephone Encounter (Signed)
Patient called asking if he still needs his follow up appointment with you on 06/08/21 regarding his H Pylori. Patient states he is now seeing Gastroenterology. I can push the appointment out if you would like for his HIV follow up.  Rella Egelston T Brooks Sailors

## 2021-06-03 ENCOUNTER — Ambulatory Visit (INDEPENDENT_AMBULATORY_CARE_PROVIDER_SITE_OTHER): Payer: Medicare Other | Admitting: Specialist

## 2021-06-03 ENCOUNTER — Ambulatory Visit: Payer: Self-pay

## 2021-06-03 ENCOUNTER — Encounter: Payer: Self-pay | Admitting: Specialist

## 2021-06-03 ENCOUNTER — Other Ambulatory Visit: Payer: Self-pay

## 2021-06-03 VITALS — BP 163/71 | HR 82 | Ht 71.5 in | Wt 177.4 lb

## 2021-06-03 DIAGNOSIS — M48061 Spinal stenosis, lumbar region without neurogenic claudication: Secondary | ICD-10-CM

## 2021-06-03 DIAGNOSIS — M85851 Other specified disorders of bone density and structure, right thigh: Secondary | ICD-10-CM

## 2021-06-03 DIAGNOSIS — M48062 Spinal stenosis, lumbar region with neurogenic claudication: Secondary | ICD-10-CM

## 2021-06-03 DIAGNOSIS — M4316 Spondylolisthesis, lumbar region: Secondary | ICD-10-CM

## 2021-06-03 DIAGNOSIS — Z9889 Other specified postprocedural states: Secondary | ICD-10-CM

## 2021-06-03 DIAGNOSIS — M85852 Other specified disorders of bone density and structure, left thigh: Secondary | ICD-10-CM

## 2021-06-03 DIAGNOSIS — G8929 Other chronic pain: Secondary | ICD-10-CM

## 2021-06-03 DIAGNOSIS — M545 Low back pain, unspecified: Secondary | ICD-10-CM

## 2021-06-03 DIAGNOSIS — M5136 Other intervertebral disc degeneration, lumbar region: Secondary | ICD-10-CM

## 2021-06-03 MED ORDER — OXYCODONE HCL 5 MG PO TABS: 5.0000 mg | ORAL_TABLET | ORAL | 0 refills | Status: DC | PRN

## 2021-06-03 NOTE — Patient Instructions (Signed)
Avoid bending, stooping and avoid lifting weights greater than 10 lbs. Avoid prolong standing and walking. Order for a new walker with wheels. Surgery scheduling secretary Kandice Hams, will call you in the next week to schedule for surgery.  Surgery recommended is a two level lumbar fusion L4-5 and L5-S1 this would be done with rods, screws and cages with local bone graft and allograft (donor bone graft). Take hydrocodone for for pain. Risk of surgery includes risk of infection 1 in 200 patients, bleeding 1/2% chance you would need a transfusion.   Risk to the nerves is one in 10,000. You will need to use a brace for 3 months and wean from the brace on the 4th month. Expect improved walking and standing tolerance. Expect relief of leg pain but numbness may persist depending on the length and degree of pressure that has been present.

## 2021-06-03 NOTE — Telephone Encounter (Signed)
Patient will be coming to our office for the breath test.

## 2021-06-03 NOTE — Progress Notes (Signed)
Office Visit Note   Patient: Jacob [name]           Date of Birth: 31-Jan-1951           MRN: [id number] Visit Date: 06/03/2021              Requested by: [name], NP [place],  [place] [address] PCP: [name], NP   Assessment & Plan: Visit Diagnoses:  1. Lumbar foraminal stenosis   2. Chronic bilateral low back pain, unspecified whether sciatica present   3. History of lumbar laminectomy for spinal cord decompression   4. Osteopenia of both hips     Plan: Avoid bending, stooping and avoid lifting weights greater than 10 lbs. Avoid prolong standing and walking. Order for a new walker with wheels. Surgery scheduling secretary Kandice Hams, will call you in the next week to schedule for surgery.  Surgery recommended is a two level lumbar fusion L4-5 and L5-S1 this would be done with rods, screws and cages with local bone graft and allograft (donor bone graft). Take hydrocodone for for pain. Risk of surgery includes risk of infection 1 in 200 patients, bleeding 1/2% chance you would need a transfusion.   Risk to the nerves is one in 10,000. You will need to use a brace for 3 months and wean from the brace on the 4th month. Expect improved walking and standing tolerance. Expect relief of leg pain but numbness may persist depending on the length and degree of pressure that has been present.  Follow-Up Instructions: Return in about 3 months (around 09/03/2021).   Orders:  Orders Placed This Encounter  Procedures   XR Lumbar Spine 2-3 Views   No orders of the defined types were placed in this encounter.     Procedures: No procedures performed   Clinical Data: No additional findings.   Subjective: Chief Complaint  Patient presents with   Lower Back - Pain    HPI  Review of Systems   Objective: Vital Signs: BP (!) 163/71 (BP Location: Right Arm, Patient Position: Sitting)   Pulse 82   Ht 5' 11.5" (1.816 m)   Wt 177 lb 6.4  oz (80.5 kg)   BMI 24.40 kg/m   Physical Exam  Ortho Exam  Specialty Comments:  No specialty comments available.  Imaging: No results found.   PMFS History: Patient Active Problem List   Diagnosis Date Noted   Thrombocytopenia ([name])    Hypercalcemia 58/06/9832   Helicobacter pylori (H. pylori) infection 04/06/2021   Lumbar foraminal stenosis 03/30/2021   Gastric hemorrhage due to angiodysplasia of stomach    GI bleed 11/24/2020   COVID-19 virus infection 11/24/2020   Other mechanical complication of cranial or spinal infusion catheter, sequela 11/11/2020   Subdural hematoma ([place]) 11/06/2020   Fall at home, initial encounter 11/05/2020   Nicotine dependence, cigarettes, uncomplicated 82/50/5397   Alcohol abuse 11/05/2020   Unspecified trochanteric fracture of left femur, initial encounter for closed fracture ([place]) 11/05/2020   Traumatic subdural hematoma, initial encounter ([place]) 11/05/2020   Fracture of metatarsal of right foot, closed 11/05/2020   Nondisplaced fracture of greater trochanter of left femur, initial encounter for closed fracture ([place])    Uremia    Angiodysplasia of intestine with hemorrhage    Pruritus, unspecified 10/02/2020   Iron deficiency anemia    Acute blood loss anemia    Acute on chronic anemia    Abnormal nuclear stress test 12/19/2019   Type  2 diabetes mellitus with diabetic neuropathy, unspecified ([place]) 08/07/2019   Allergy, unspecified, initial encounter 77/93/9030   Complication of vascular dialysis catheter 08/02/2019   Drug abuse counseling and surveillance of drug abuser 08/02/2019   Other specified coagulation defects ([place]) 08/02/2019   Secondary hyperparathyroidism of renal origin ([place]) 08/02/2019   Unspecified atrial fibrillation ([place]) 08/02/2019   ESRD on hemodialysis ([place])    Cocaine abuse ([place])    CHF exacerbation ([place]) 07/21/2019   Diabetic foot ulcer ([place]) 02/11/2019   AVM (arteriovenous malformation)    [place] 02/07/2019    Symptomatic anemia 02/06/2019   Hyperlipidemia associated with type 2 diabetes mellitus ([place]) 05/30/2018   Right foot ulcer ([place]) 02/28/2018   Chronic obstructive pulmonary disease ([place]) 02/28/2018   Anemia of chronic disease 11/20/2016   CHF (congestive heart failure) ([place]) 11/10/2016   History of lumbar laminectomy for spinal cord decompression 02/29/2016   Type 2 diabetes mellitus with diabetic polyneuropathy, with long-term current use of insulin ([place]) 06/17/2015   HTN (hypertension) 04/26/2015   DM (diabetes mellitus) ([place]) 04/26/2015   Ischemic cardiomyopathy 05/12/2014   Shock ([place]) 09/06/2013   Bunion of left foot 11/25/2011   Bunion, right foot 11/25/2011   Polysubstance abuse ([place]) 07/28/2011   Hip fracture, left ([place]) 04/04/2011   Closed fracture of neck of femur ([place]) 04/04/2011   Insomnia 02/18/2011   Coronary artery disease involving native heart without angina pectoris 04/20/2009   Chronic combined systolic (congestive) and diastolic (congestive) heart failure ([place]) 04/20/2009   Mixed hyperlipidemia 11/20/2006   Gout 11/20/2006   TOBACCO ABUSE 11/20/2006   Essential hypertension 11/20/2006   Human immunodeficiency virus (HIV) disease ([place]) 09/02/2006   Past Medical History:  Diagnosis Date   Acute respiratory failure ([place]) 03/01/2018   Anemia    Arthritis    "all over; mostly knees and back" (02/28/2018)   Chronic combined systolic and diastolic CHF (congestive heart failure) (HCC)    Chronic lower back pain    stenosis   Community acquired pneumonia 09/06/2013   COPD (chronic obstructive pulmonary disease) ([place])    Coronary atherosclerosis of native coronary artery    a. 02/2003 s/p CABG x 2 (VG->RI, VG->RPDA; b. 11/2019 PCI: LM nl, LAD 90d, D3 50, RI 100, LCX 100p, OM3 100 - fills via L->L collats from D2/dLAD, RCA 100p, VG->RPDA ok, VG->RI 95 (3.5x48 Synergy XD DES).   Drug abuse ([place])    hx; tested for cocaine as recently as 2/08. says he is not using drugs now -  avoided defib. for this reason    ESRD (end stage renal disease) ([place])    Hemo M-W-F- Richarda Blade   Fall at home 10/2020   GERD (gastroesophageal reflux disease)    takes OTC meds as needed   GI bleeding    a. 11/2019 EGD: angiodysplastic lesions w/ bleeding s/p argon plasma/clipping/epi inj. Multiple admissions for the same.   Glaucoma    uses eye drops daily   Hepatitis B 1968   "tx'd w/isolation; caught it from toilet stools in gym"   History of blood transfusion 03/01/2019   History of colon polyps    benign   History of gout    takes Allopurinol daily as well as Colchicine-if needed (02/28/2018)   History of kidney stones    HTN (hypertension)    takes Coreg,Imdur.and Apresoline daily   Human immunodeficiency virus (HIV) disease ([employer]) dx'd 1995   on [place] as of 12/2020.     Hyperlipidemia    Ischemic cardiomyopathy  a. 01/2019 Echo: EF 40-45%, diffuse HK, mild basal septal hypertrophy. Diast dysfxn. Nl RV size/fxn. Sev dil LA. Triv MR/TR/PR.   Muscle spasm    takes Zanaflex as needed   Myocardial infarction ([place]) ~ 2004/2005   Nocturia    Peripheral neuropathy    takes gabapentin daily   Pneumonia    "at least twice" (02/28/2018)   SDH (subdural hematoma) (HCC)    Syphilis, unspecified    Type II diabetes mellitus ([place]) 2004   Lantus daily.Average fasting blood sugar 125-199   Wears glasses    Wears partial dentures     Family History  Problem Relation Age of Onset   Heart failure Father    Hypertension Father    Diabetes Brother    Heart attack Brother    Alzheimer's disease Mother    Stroke Sister    Diabetes Sister    Alzheimer's disease Sister    Hypertension Brother    Diabetes Brother    Drug abuse Brother    Colon cancer Neg Hx     Past Surgical History:  Procedure Laterality Date   AV FISTULA PLACEMENT Left 08/02/2018   Procedure: ARTERIOVENOUS (AV) FISTULA CREATION  left arm radiocephlic;  Surgeon: Marty Heck, MD;  Location: [place];   Service: Vascular;  Laterality: Left;   AV FISTULA PLACEMENT Left 08/01/2019   Procedure: LEFT BRACHIOCEPHALIC ARTERIOVENOUS (AV) FISTULA CREATION;  Surgeon: [name], MD;  Location: [place];  Service: Vascular;  Laterality: Left;   [place] Left 10/03/2019   Procedure: BASILIC VEIN TRANSPOSITION LEFT SECOND STAGE;  Surgeon: [name], MD;  Location: [place];  Service: Vascular;  Laterality: Left;   BIOPSY  01/25/2021   Procedure: BIOPSY;  Surgeon: Doran Stabler, MD;  Location: [facility] ENDOSCOPY;  Service: Gastroenterology;;   CARDIAC CATHETERIZATION  10/2002; 12/19/2004   [name] 03/08/2011   COLONOSCOPY  2013   [place]    CORONARY ARTERY BYPASS GRAFT  02/24/2003   CABG X2/notes 03/08/2011   CORONARY STENT INTERVENTION N/A 12/19/2019   Procedure: CORONARY STENT INTERVENTION;  Surgeon: Jettie Booze, MD;  Location: [place] CV LAB;  Service: Cardiovascular;  Laterality: N/A;   ENTEROSCOPY N/A 01/25/2021   Procedure: ENTEROSCOPY;  Surgeon: Doran Stabler, MD;  Location: [place];  Service: Gastroenterology;  Laterality: N/A;   ENTEROSCOPY N/A 02/13/2021   Procedure: ENTEROSCOPY;  Surgeon: Jackquline Denmark, MD;  Location: [facility] ENDOSCOPY;  Service: Endoscopy;  Laterality: N/A;   ENTEROSCOPY N/A 05/07/2021   Procedure: ENTEROSCOPY;  Surgeon: Yetta Flock, MD;  Location: [facility] ENDOSCOPY;  Service: Gastroenterology;  Laterality: N/A;   ESOPHAGOGASTRODUODENOSCOPY (EGD) WITH PROPOFOL N/A 02/08/2019   Procedure: ESOPHAGOGASTRODUODENOSCOPY (EGD) WITH PROPOFOL;  Surgeon: Milus Banister, MD;  Location: [place];  Service: Gastroenterology;  Laterality: N/A;   ESOPHAGOGASTRODUODENOSCOPY (EGD) WITH PROPOFOL N/A 12/22/2019   Procedure: ESOPHAGOGASTRODUODENOSCOPY (EGD) WITH PROPOFOL;  Surgeon: Lavena Bullion, DO;  Location: [facility];  Service: Gastroenterology;  Laterality: N/A;   ESOPHAGOGASTRODUODENOSCOPY (EGD) WITH PROPOFOL N/A 10/19/2020   Procedure:  ESOPHAGOGASTRODUODENOSCOPY (EGD) WITH PROPOFOL;  Surgeon: Jackquline Denmark, MD;  Location: [facility] ENDOSCOPY;  Service: Endoscopy;  Laterality: N/A;   ESOPHAGOGASTRODUODENOSCOPY (EGD) WITH PROPOFOL N/A 12/22/2020   Procedure: ESOPHAGOGASTRODUODENOSCOPY (EGD) WITH PROPOFOL;  Surgeon: Gatha Mayer, MD;  Location: [place];  Service: Endoscopy;  Laterality: N/A;   ESOPHAGOGASTRODUODENOSCOPY (EGD) WITH PROPOFOL N/A 01/09/2021   Procedure: ESOPHAGOGASTRODUODENOSCOPY (EGD) WITH PROPOFOL;  Surgeon: Irene Shipper, MD;  Location: [facility]  ENDOSCOPY;  Service: Endoscopy;  Laterality: N/A;   HEMOSTASIS CLIP PLACEMENT  12/22/2019   Procedure: HEMOSTASIS CLIP PLACEMENT;  Surgeon: Lavena Bullion, DO;  Location: [place] ENDOSCOPY;  Service: Gastroenterology;;   HEMOSTASIS CLIP PLACEMENT  12/22/2020   Procedure: HEMOSTASIS CLIP PLACEMENT;  Surgeon: Gatha Mayer, MD;  Location: [facility] ENDOSCOPY;  Service: Endoscopy;;   HEMOSTASIS CONTROL  12/22/2020   Procedure: HEMOSTASIS CONTROL/hemospray;  Surgeon: Gatha Mayer, MD;  Location: [place];  Service: Endoscopy;;   HOT HEMOSTASIS N/A 02/08/2019   Procedure: HOT HEMOSTASIS (ARGON PLASMA COAGULATION/BICAP);  Surgeon: Milus Banister, MD;  Location: [facility] ENDOSCOPY;  Service: Gastroenterology;  Laterality: N/A;   HOT HEMOSTASIS N/A 12/22/2019   Procedure: HOT HEMOSTASIS (ARGON PLASMA COAGULATION/BICAP);  Surgeon: Lavena Bullion, DO;  Location: [facility] ENDOSCOPY;  Service: Gastroenterology;  Laterality: N/A;   HOT HEMOSTASIS N/A 10/19/2020   Procedure: HOT HEMOSTASIS (ARGON PLASMA COAGULATION/BICAP);  Surgeon: Jackquline Denmark, MD;  Location: [facility] ENDOSCOPY;  Service: Endoscopy;  Laterality: N/A;   HOT HEMOSTASIS N/A 12/22/2020   Procedure: HOT HEMOSTASIS (ARGON PLASMA COAGULATION/BICAP);  Surgeon: Gatha Mayer, MD;  Location: [facility] ENDOSCOPY;  Service: Endoscopy;  Laterality: N/A;   HOT HEMOSTASIS N/A 01/09/2021   Procedure: HOT HEMOSTASIS (ARGON PLASMA COAGULATION/BICAP);  Surgeon: Irene Shipper, MD;  Location: [facility] ENDOSCOPY;  Service: Endoscopy;  Laterality: N/A;   HOT HEMOSTASIS N/A 01/25/2021   Procedure: HOT HEMOSTASIS (ARGON PLASMA COAGULATION/BICAP);  Surgeon: Doran Stabler, MD;  Location: [place];  Service: Gastroenterology;  Laterality: N/A;   HOT HEMOSTASIS N/A 02/13/2021   Procedure: HOT HEMOSTASIS (ARGON PLASMA COAGULATION/BICAP);  Surgeon: Jackquline Denmark, MD;  Location: [facility] ENDOSCOPY;  Service: Endoscopy;  Laterality: N/A;   HOT HEMOSTASIS N/A 05/07/2021   Procedure: HOT HEMOSTASIS (ARGON PLASMA COAGULATION/BICAP);  Surgeon: Yetta Flock, MD;  Location: [facility] ENDOSCOPY;  Service: Gastroenterology;  Laterality: N/A;   INTERTROCHANTERIC HIP FRACTURE SURGERY Left 11/2006   [name] 03/08/2011   INTRAVASCULAR ULTRASOUND/IVUS N/A 12/19/2019   Procedure: Intravascular Ultrasound/IVUS;  Surgeon: Jettie Booze, MD;  Location: [place] CV LAB;  Service: Cardiovascular;  Laterality: N/A;   IR FLUORO GUIDE CV LINE RIGHT  07/24/2019   IR FLUORO GUIDE CV LINE RIGHT  07/30/2019   IR US GUIDE VASC ACCESS RIGHT  07/24/2019   IR US GUIDE VASC ACCESS RIGHT  07/30/2019   LAPAROSCOPIC CHOLECYSTECTOMY  05/2006   LIGATION OF COMPETING BRANCHES OF ARTERIOVENOUS FISTULA Left 11/05/2018   Procedure: LIGATION OF COMPETING BRANCHES OF ARTERIOVENOUS FISTULA  LEFT  ARM;  Surgeon: Marty Heck, MD;  Location[facility];  Service: Vascular;  Laterality: Left;   LUMBAR LAMINECTOMY/DECOMPRESSION MICRODISCECTOMY N/A 02/29/2016   Procedure: Left L4-5 Lateral Recess Decompression, Removal Extradural Intraspinal Facet Cyst;  Surgeon: Marybelle Killings, MD;  Location: [place];  Service: Orthopedics;  Laterality: N/A;   MULTIPLE TOOTH EXTRACTIONS     ORIF MANDIBULAR FRACTURE Left 08/13/2004   ORIF of left body fracture mandible with KLS Martin 2.3-mm six hole/notes 03/08/2011   RIGHT/LEFT HEART CATH AND CORONARY/GRAFT ANGIOGRAPHY N/A 12/19/2019   Procedure: RIGHT/LEFT HEART CATH AND CORONARY/GRAFT  ANGIOGRAPHY;  Surgeon: Jettie Booze, MD;  Location: [place] CV LAB;  Service: Cardiovascular;  Laterality: N/A;   SCLEROTHERAPY  12/22/2019   Procedure: SCLEROTHERAPY;  Surgeon: Lavena Bullion, DO;  Location: [place];  Service: Gastroenterology;;   Clide Deutscher  02/13/2021   Procedure: Clide Deutscher;  Surgeon: Jackquline Denmark, MD;  [facility] ENDOSCOPY;  Service: Endoscopy;;   Social  History   Occupational History   Occupation: retired  Tobacco Use   Smoking status: Every Day    Packs/day: 0.50    Years: 43.00    Pack years: 21.50    Types: Cigarettes   Smokeless tobacco: Never  Vaping Use   Vaping Use: Never used  Substance and Sexual Activity   Alcohol use: Not Currently    Alcohol/week: 12.0 standard drinks    Types: 12 Standard drinks or equivalent per week    Comment: occassional   Drug use: Not Currently    Types: Cocaine    Comment: hx of crack/cocaine 9yrs ago 10/01/2019- none   Sexual activity: Yes    Partners: Male    Comment: Declined condoms

## 2021-06-04 DIAGNOSIS — N186 End stage renal disease: Secondary | ICD-10-CM | POA: Diagnosis not present

## 2021-06-04 DIAGNOSIS — D631 Anemia in chronic kidney disease: Secondary | ICD-10-CM | POA: Diagnosis not present

## 2021-06-04 DIAGNOSIS — Z992 Dependence on renal dialysis: Secondary | ICD-10-CM | POA: Diagnosis not present

## 2021-06-04 DIAGNOSIS — D509 Iron deficiency anemia, unspecified: Secondary | ICD-10-CM | POA: Diagnosis not present

## 2021-06-04 DIAGNOSIS — N2581 Secondary hyperparathyroidism of renal origin: Secondary | ICD-10-CM | POA: Diagnosis not present

## 2021-06-05 NOTE — Progress Notes (Signed)
Subjective: Patient presents today with diabetes and cc of painful, discolored, thick toenails which interfere with daily activities. Pain is aggravated when wearing enclosed shoe gear. Pain is getting progressively worse and relieved with periodic professional debridement.  He states blood glucose was 177 at lunchtime.today.  He relates plantar callus on right foot is tender. Denies any drainage, redness or swelling. Denies any fever, chills, night sweats, nausea or vomiting.  He is on dialysis and dialysis days are MWF.   Current Outpatient Medications on File Prior to Visit  Medication Sig Dispense Refill   Methoxy PEG-Epoetin Beta (MIRCERA IJ) Mircera     VITAMIN D PO Take by mouth.     acetaminophen (TYLENOL) 650 MG CR tablet Take 1,300 mg by mouth 3 (three) times daily.     allopurinol (ZYLOPRIM) 100 MG tablet TAKE 1 TABLET(100 MG) BY MOUTH DAILY 90 tablet 1   ANORO ELLIPTA 62.5-25 MCG/INH AEPB INHALE 1 PUFF BY MOUTH EVERY DAY 60 each 5   atorvastatin (LIPITOR) 10 MG tablet Take 1 tablet (10 mg total) by mouth daily. 90 tablet 3   B-D UF III MINI PEN NEEDLES 31G X 5 MM MISC USE FOUR TIMES DAILY 100 each 11   bictegravir-emtricitabine-tenofovir AF (BIKTARVY) 50-200-25 MG TABS tablet Take 1 tablet by mouth daily. 30 tablet 11   Calcium Acetate 667 MG TABS Take 667-1,334 mg by mouth See admin instructions. Take 1,334 mg by mouth three times a day with meals and 667 mg with each snack     carvedilol (COREG) 3.125 MG tablet TAKE 1 TABLET(3.125 MG) BY MOUTH TWICE DAILY 180 tablet 3   colchicine 0.6 MG tablet TAKE 1 TABLET BY MOUTH AS NEEDED FOR GOUT 30 tablet 1   diclofenac Sodium (VOLTAREN) 1 % GEL APPLY 2 GRAMS TOPICALLY TO THE AFFECTED AREA THREE TIMES DAILY AS NEEDED FOR PAIN 967 g 3   folic acid (FOLVITE) 1 MG tablet Take 1 tablet (1 mg total) by mouth daily. 90 tablet 1   gabapentin (NEURONTIN) 300 MG capsule Take 1 capsule (300 mg total) by mouth daily. Dose change due to renal  function 90 capsule 1   glucose blood (ONETOUCH ULTRA) test strip Check blood sugar three times daily. Dx:E11.40 300 strip 3   insulin lispro (HUMALOG) 100 UNIT/ML KwikPen INJECT 13 UNITS UNDER THE SKIN THREE TIMES DAILY AS NEEDED FOR HIGH BLOOD SUGAR(ABOVE 150) 45 mL 1   isosorbide mononitrate (IMDUR) 30 MG 24 hr tablet TAKE 1 TABLET BY MOUTH EVERY DAY 90 tablet 1   Lancets (ONETOUCH DELICA PLUS ELFYBO17P) MISC Inject 1 Device as directed 3 (three) times daily. Dx: E11.40 300 each 3   latanoprost (XALATAN) 0.005 % ophthalmic solution Place 1 drop into both eyes at bedtime.     lidocaine-prilocaine (EMLA) cream Apply 1 application topically every Monday, Wednesday, and Friday with hemodialysis.     multivitamin (RENA-VIT) TABS tablet Take 1 tablet by mouth daily.     nitroGLYCERIN (NITROSTAT) 0.3 MG SL tablet PLACE 1 TABLET UNDER THE TONGUE AS NEEDED FOR CHEST PAIN (Patient not taking: Reported on 05/18/2021) 75 tablet 2   oxyCODONE-acetaminophen (PERCOCET/ROXICET) 5-325 MG tablet Take 1 tablet by mouth daily as needed for severe pain.     pantoprazole (PROTONIX) 40 MG tablet Take 1 tablet (40 mg total) by mouth 2 (two) times daily before a meal. 60 tablet 2   PFIZER-BIONT COVID-19 VAC-TRIS SUSP injection      polyethylene glycol (MIRALAX / GLYCOLAX) 17 g packet Take 17  g by mouth daily as needed for mild constipation. 14 each 0   timolol (TIMOPTIC) 0.5 % ophthalmic solution Place 1 drop into both eyes in the morning.     No current facility-administered medications on file prior to visit.     Allergies  Allergen Reactions   Augmentin [Amoxicillin-Pot Clavulanate] Diarrhea and Other (See Comments)    Severe diarrhea   Mucinex Fast-Max Other (See Comments)    Intense sweating    Amphetamines Other (See Comments)    Unknown reaction     Objective: There were no vitals filed for this visit.  Jacob [name] is a pleasant 70 y.o. male WD, WN in NAD.[name] AAO X 3.  Vascular  Examination: Capillary refill time to digits <4 seconds b/l lower extremities. Palpable DP pulse(s) b/l lower extremities Palpable PT pulse(s) b/l lower extremities Pedal hair absent. Lower extremity skin temperature gradient within normal limits. No pain with calf compression b/l. No edema noted b/l lower extremities.  Dermatological Examination: Skin warm and supple b/l lower extremities. No interdigital macerations b/l lower extremities. Toenails 1-5 b/l elongated, discolored, dystrophic, thickened, crumbly with subungual debris and tenderness to dorsal palpation. No images are attached to the encounter.  Wound Location: submet head 4 right foot There is a minimal amount of devitalized tissue present in the wound. Predebridement Wound Measurement:  callus measures 1.0 x 1.0 cm with 0.2 cm elevation Postdebridement Wound Measurement: 0.5 x 0.3 x 0.1 cm. Wound Base: Mixed Granular/Fibrotic Peri-wound: Normal Exudate: None: wound tissue dry Blood Loss during debridement: 0 cc('s). Material in wound which inhibits healing/promotes adjacent tissue breakdown:  exuberant hyperkeratosis. Description of tissue removed from ulceration today:  exuberant hyperkeratosis. Sign(s) of clinical bacterial infection: no clinical signs of infection noted on examination today.   Musculoskeletal Examination: Normal muscle strength 5/5 to all lower extremity muscle groups bilaterally. Hallux valgus with bunion deformity noted b/l lower extremities. Hammertoe(s) noted to the 2-5 bilaterally.  Neurological Examination: Pt has subjective symptoms of neuropathy. Protective sensation diminished with 10g monofilament b/l. Vibratory sensation diminished b/l.  Assessment and Plan 1. Onychomycosis   2. Diabetic ulcer of other part of right foot associated with diabetes mellitus due to underlying condition, limited to breakdown of skin ([place])   3. Diabetes mellitus due to underlying condition with diabetic  polyneuropathy, unspecified whether long term insulin use ([place])      -Patient was evaluated and treated and all questions answered.  -Patient/POA/Family member educated on diagnosis and treatment plan of routine ulcer debridement/wound care.  -Ulceration debridement achieved utilizing sharp excisional debridement with sterile scalpel blade.. Type/amount of devitalized tissue removed: exuberant hyperkeratosis -Today's ulcer size post-debridement: 0.5 x 0.3 x 0.1 cm. -Ulceration cleansed with wound cleanser. Iodosorb Gel applied to base of ulceration and secured with light dressing. -Wound responded well to today's debridement. It is partial thickness.  -Patient risk factors affecting healing of ulcer: uncontrolled diabetes, diabetic neuropathy, recurrence of wound -[name] was instructed on daily wound care for submet head 4 right foot ulceration. He is to keep right foot dry. He is to resume wearing his [place] shoe. Perform once daily Iodosorb Gel dressing changes until he sees Dr. Sherryle Lis. -Toenails 1-5 b/l were debrided in length and girth with sterile nail nippers and dremel without iatrogenic bleeding.  -Patient to report any pedal injuries to medical professional immediately. -Patient scheduled to see Dr. Sherryle Lis  in 2 weeks  for follow up of diabetic foot ulcer submet head 4 right foot. He is  to call office should he encounter any problems before then.. -Patient/POA to call should there be question/concern in the interim.  Return in about 2 weeks (around 06/15/2021).  Marzetta Board, DPM

## 2021-06-07 DIAGNOSIS — N186 End stage renal disease: Secondary | ICD-10-CM | POA: Diagnosis not present

## 2021-06-07 DIAGNOSIS — Z992 Dependence on renal dialysis: Secondary | ICD-10-CM | POA: Diagnosis not present

## 2021-06-07 DIAGNOSIS — D631 Anemia in chronic kidney disease: Secondary | ICD-10-CM | POA: Diagnosis not present

## 2021-06-07 DIAGNOSIS — N2581 Secondary hyperparathyroidism of renal origin: Secondary | ICD-10-CM | POA: Diagnosis not present

## 2021-06-07 DIAGNOSIS — D509 Iron deficiency anemia, unspecified: Secondary | ICD-10-CM | POA: Diagnosis not present

## 2021-06-08 ENCOUNTER — Other Ambulatory Visit: Payer: Medicare Other

## 2021-06-08 ENCOUNTER — Ambulatory Visit: Payer: Medicare Other

## 2021-06-08 ENCOUNTER — Ambulatory Visit: Payer: Medicare Other | Admitting: Internal Medicine

## 2021-06-08 ENCOUNTER — Other Ambulatory Visit: Payer: Self-pay

## 2021-06-08 DIAGNOSIS — A048 Other specified bacterial intestinal infections: Secondary | ICD-10-CM | POA: Diagnosis not present

## 2021-06-09 ENCOUNTER — Telehealth: Payer: Self-pay

## 2021-06-09 ENCOUNTER — Telehealth: Payer: Self-pay | Admitting: Gastroenterology

## 2021-06-09 DIAGNOSIS — Z992 Dependence on renal dialysis: Secondary | ICD-10-CM | POA: Diagnosis not present

## 2021-06-09 DIAGNOSIS — D509 Iron deficiency anemia, unspecified: Secondary | ICD-10-CM | POA: Diagnosis not present

## 2021-06-09 DIAGNOSIS — D631 Anemia in chronic kidney disease: Secondary | ICD-10-CM | POA: Diagnosis not present

## 2021-06-09 DIAGNOSIS — N2581 Secondary hyperparathyroidism of renal origin: Secondary | ICD-10-CM | POA: Diagnosis not present

## 2021-06-09 DIAGNOSIS — N186 End stage renal disease: Secondary | ICD-10-CM | POA: Diagnosis not present

## 2021-06-09 LAB — H. PYLORI BREATH TEST: H. pylori Breath Test: DETECTED — AB

## 2021-06-09 NOTE — Telephone Encounter (Signed)
Spoke with patient, he wanted to clarify what his injection was for tomorrow. He thought that his injection was for his H. Pylori. Advised that is not correct, advised that the Octreotide was for his AVM's and that infectious disease is managing his H. Pylori treatment. Patient was thankful for the return call. Pt verbalized understanding and had no concerns at the end of the call.

## 2021-06-09 NOTE — Telephone Encounter (Signed)
Called patient to schedule appointment in four weeks with Dr. [name] for follow up. Patient states he will call back office to schedule appointment after checking his schedule and setting up transportation.  Leatrice Jewels, RMA

## 2021-06-09 NOTE — Telephone Encounter (Signed)
Patient returned call again and was able to agree to an appt. Is scheduled for 9/15 with Dr. [name]. Leatrice Jewels, RMA

## 2021-06-09 NOTE — Telephone Encounter (Addendum)
Inbound call from patient requesting a call from a nurse please in regards to his infusion scheduled for tomorrow.

## 2021-06-09 NOTE — Telephone Encounter (Signed)
-----   Message from [name], MD sent at 06/09/2021 11:57 AM EDT ----- Regarding: H. pylori persistently positive Please schedule right to see me within the next 4 weeks for a 30-minute visit to review his still positive H. pylori breath test.  Jenny Reichmann ----- Message ----- From: Interface, [facility] Results In Sent: 06/09/2021   9:53 AM EDT To: [name], MD

## 2021-06-09 NOTE — Telephone Encounter (Signed)
Patient returned call about scheduling an appointment. Patient refused to schedule appointment at this time. States that he has been running between [place] and ID and feels like someone is wrong regarding his diagnosis.  Forward message to MD.   Leatrice Jewels, RMA

## 2021-06-10 ENCOUNTER — Encounter (HOSPITAL_COMMUNITY)
Admission: RE | Admit: 2021-06-10 | Discharge: 2021-06-10 | Disposition: A | Discharge status: Home / Self Care | Payer: Medicare Other | Source: Ambulatory Visit | Attending: Gastroenterology | Admitting: Gastroenterology

## 2021-06-10 ENCOUNTER — Ambulatory Visit: Payer: Medicare Other | Admitting: Specialist

## 2021-06-10 ENCOUNTER — Other Ambulatory Visit: Payer: Self-pay

## 2021-06-10 DIAGNOSIS — Q2730 Arteriovenous malformation, site unspecified: Secondary | ICD-10-CM | POA: Insufficient documentation

## 2021-06-10 MED ORDER — OCTREOTIDE ACETATE 10 MG IM KIT
10.0000 mg | PACK | INTRAMUSCULAR | Status: DC
  Filled 2021-06-10: qty 1

## 2021-06-10 MED ORDER — OCTREOTIDE ACETATE 10 MG IM KIT
10.0000 mg | PACK | INTRAMUSCULAR | Status: DC
  Administered 2021-06-10: 10 mg via INTRAMUSCULAR
  Filled 2021-06-10: qty 1

## 2021-06-11 ENCOUNTER — Telehealth: Payer: Self-pay | Admitting: Cardiovascular Disease

## 2021-06-11 DIAGNOSIS — N186 End stage renal disease: Secondary | ICD-10-CM | POA: Diagnosis not present

## 2021-06-11 DIAGNOSIS — Z992 Dependence on renal dialysis: Secondary | ICD-10-CM | POA: Diagnosis not present

## 2021-06-11 DIAGNOSIS — N2581 Secondary hyperparathyroidism of renal origin: Secondary | ICD-10-CM | POA: Diagnosis not present

## 2021-06-11 DIAGNOSIS — D509 Iron deficiency anemia, unspecified: Secondary | ICD-10-CM | POA: Diagnosis not present

## 2021-06-11 DIAGNOSIS — D631 Anemia in chronic kidney disease: Secondary | ICD-10-CM | POA: Diagnosis not present

## 2021-06-11 NOTE — Telephone Encounter (Signed)
Pt called to report that he is worried about having a spinal fusion as recommended by Dr. Louanne Skye.. he is asking that if they contact Dr. [name] to say he cannot have it from a cardiac standpoint.. he is worried about rehab after and how he will manage his dialysis post op.. I strongly urged him to talk with the surgeons office and talk to them about his concerns... I advised that he needs to write down his questions and talk to them about each of them.   I also told him to talk with his Nephrologist about it and what the post op plan would be.   I advised that we currently do not have a surgery clearance in his chart yet from the surgeons office.

## 2021-06-11 NOTE — Telephone Encounter (Signed)
FYI--Patient states he will be having a procedure with Dr. Louanne Skye. He states it hasn't been scheduled yet, but he plans to have their office contact us for clearance.  Dr. Otho Ket office #: [phone]

## 2021-06-11 NOTE — Telephone Encounter (Signed)
   Pt requesting to speak with a nurse, he keep saying he wanted to discuss a "situation" with her

## 2021-06-14 ENCOUNTER — Telehealth: Payer: Self-pay | Admitting: Specialist

## 2021-06-14 ENCOUNTER — Telehealth: Payer: Medicare Other | Admitting: Family

## 2021-06-14 ENCOUNTER — Other Ambulatory Visit: Payer: Self-pay | Admitting: Specialist

## 2021-06-14 DIAGNOSIS — N2581 Secondary hyperparathyroidism of renal origin: Secondary | ICD-10-CM | POA: Diagnosis not present

## 2021-06-14 DIAGNOSIS — D509 Iron deficiency anemia, unspecified: Secondary | ICD-10-CM | POA: Diagnosis not present

## 2021-06-14 DIAGNOSIS — N186 End stage renal disease: Secondary | ICD-10-CM | POA: Diagnosis not present

## 2021-06-14 DIAGNOSIS — Z992 Dependence on renal dialysis: Secondary | ICD-10-CM | POA: Diagnosis not present

## 2021-06-14 DIAGNOSIS — D631 Anemia in chronic kidney disease: Secondary | ICD-10-CM | POA: Diagnosis not present

## 2021-06-14 NOTE — Telephone Encounter (Signed)
I called and lmom that I have sent his refill request to another provider since Dr. Louanne Skye is out of the office, and I advised that we allow 2-48 hours for the refill to be approved/denied.

## 2021-06-14 NOTE — Telephone Encounter (Signed)
Pt called requesting a refill of oxycodone. Please send to pharmacy on file. Pt is asking for a phone call when medication has been sent in. Pt phone number is [phone].

## 2021-06-14 NOTE — Telephone Encounter (Signed)
Pt called requesting a refill of oxycodone. Pt states he called this morning but no notes are present. Please send to pharmacy on file. Please call pt when medication has been sent in. Pt phone number is [phone].

## 2021-06-15 ENCOUNTER — Other Ambulatory Visit: Payer: Self-pay

## 2021-06-15 ENCOUNTER — Encounter: Payer: Self-pay | Admitting: Family

## 2021-06-15 ENCOUNTER — Ambulatory Visit (INDEPENDENT_AMBULATORY_CARE_PROVIDER_SITE_OTHER): Payer: Medicare Other | Admitting: Family

## 2021-06-15 VITALS — BP 148/80 | HR 88 | Temp 98.0°F | Resp 18 | Ht 71.5 in

## 2021-06-15 DIAGNOSIS — R112 Nausea with vomiting, unspecified: Secondary | ICD-10-CM

## 2021-06-15 DIAGNOSIS — R059 Cough, unspecified: Secondary | ICD-10-CM

## 2021-06-15 DIAGNOSIS — R0989 Other specified symptoms and signs involving the circulatory and respiratory systems: Secondary | ICD-10-CM

## 2021-06-15 MED ORDER — ONDANSETRON HCL 4 MG PO TABS: 4.0000 mg | ORAL_TABLET | Freq: Three times a day (TID) | ORAL | 0 refills | Status: DC | PRN

## 2021-06-15 NOTE — Progress Notes (Signed)
Provider: Lynsie Mcwatters FNP-C  [name], NP  Patient Care Team: [name], NP as PCP - General (Geriatric Medicine) [name], MD as PCP - Infectious Diseases (Infectious Diseases) [name], MD as PCP - Cardiology (Cardiology) [name], MD as Consulting Physician (Ophthalmology) [name], MD as Consulting Physician (Cardiology) [name], MD as Consulting Physician (Infectious Diseases) Gardiner Barefoot, DPM as Consulting Physician (Podiatry) Monroe Qin, Nelda Bucks, NP as Nurse Practitioner (Family Medicine) [place], [name]  Extended Emergency Contact Information Primary Emergency Contact: [place] Phone: [phone] Mobile Phone: [phone] Relation: Sister  Code Status: Full Code  Goals of care: Advanced Directive information Advanced Directives 06/15/2021  Does Patient Have a Medical Advance Directive? No  Type of Advance Directive -  Does patient want to make changes to medical advance directive? -  Copy of [place] in Chart? -  Would patient like information on creating a medical advance directive? No - Patient declined  Pre-existing out of facility DNR order (yellow form or pink MOST form) -     Chief Complaint  Patient presents with   Acute Visit    Patient complains of cough, runny nose, nausea, and vomiting.     HPI:  Pt is a 70 y.o. male seen today for an acute visit for evaluation of cough,runny nose ,nausea and vomiting since Saturday.  States took oxycodone on Sunday which helped with his symptoms.  Also took another OTC pill for pain that he cannot remember. Took tylenol before coming to visit. Feels shortness of breath coming from the car to the office. Had a  little phlegm.  No contact with person with COVID-19  Had nausea and vomiting on Saturday but none since then. Denies any abdominal cramping,distention or pain.States had small bowel movement today though appetite has not  been that great.  B/p elevated on arrival recheck improved.   Past Medical History:  Diagnosis Date   Acute respiratory failure ([place]) 03/01/2018   Anemia    Arthritis    "all over; mostly knees and back" (02/28/2018)   Chronic combined systolic and diastolic CHF (congestive heart failure) (HCC)    Chronic lower back pain    stenosis   Community acquired pneumonia 09/06/2013   COPD (chronic obstructive pulmonary disease) ([place])    Coronary atherosclerosis of native coronary artery    a. 02/2003 s/p CABG x 2 (VG->RI, VG->RPDA; b. 11/2019 PCI: LM nl, LAD 90d, D3 50, RI 100, LCX 100p, OM3 100 - fills via L->L collats from D2/dLAD, RCA 100p, VG->RPDA ok, VG->RI 95 (3.5x48 Synergy XD DES).   Drug abuse ([place])    hx; tested for cocaine as recently as 2/08. says he is not using drugs now - avoided defib. for this reason    ESRD (end stage renal disease) ([place])    Hemo M-W-F- [facility]   Fall at home 10/2020   GERD (gastroesophageal reflux disease)    takes OTC meds as needed   GI bleeding    a. 11/2019 EGD: angiodysplastic lesions w/ bleeding s/p argon plasma/clipping/epi inj. Multiple admissions for the same.   Glaucoma    uses eye drops daily   Hepatitis B 1968   "tx'd w/isolation; caught it from toilet stools in gym"   History of blood transfusion 03/01/2019   History of colon polyps    benign   History of gout    takes Allopurinol daily as well as Colchicine-if needed (02/28/2018)   History of kidney stones  HTN (hypertension)    takes Coreg,Imdur.and Apresoline daily   Human immunodeficiency virus (HIV) disease ([place]) dx'd 1995   on [place] as of 12/2020.     Hyperlipidemia    Ischemic cardiomyopathy    a. 01/2019 Echo: EF 40-45%, diffuse HK, mild basal septal hypertrophy. Diast dysfxn. Nl RV size/fxn. Sev dil LA. Triv MR/TR/PR.   Muscle spasm    takes Zanaflex as needed   Myocardial infarction ([place]) ~ 2004/2005   Nocturia    Peripheral neuropathy    takes gabapentin daily   Pneumonia     "at least twice" (02/28/2018)   SDH (subdural hematoma) (HCC)    Syphilis, unspecified    Type II diabetes mellitus ([place]) 2004   Lantus daily.Average fasting blood sugar 125-199   Wears glasses    Wears partial dentures    Past Surgical History:  Procedure Laterality Date   AV FISTULA PLACEMENT Left 08/02/2018   Procedure: ARTERIOVENOUS (AV) FISTULA CREATION  left arm radiocephlic;  Surgeon: Marty Heck, MD;  Location: [place];  Service: Vascular;  Laterality: Left;   AV FISTULA PLACEMENT Left 08/01/2019   Procedure: LEFT BRACHIOCEPHALIC ARTERIOVENOUS (AV) FISTULA CREATION;  Surgeon: [name], MD;  Location: [place];  Service: Vascular;  Laterality: Left;   [place] Left 10/03/2019   Procedure: BASILIC VEIN TRANSPOSITION LEFT SECOND STAGE;  Surgeon: [name], MD;  Location: [place];  Service: Vascular;  Laterality: Left;   BIOPSY  01/25/2021   Procedure: BIOPSY;  Surgeon: Doran Stabler, MD;  Location: [facility] ENDOSCOPY;  Service: Gastroenterology;;   CARDIAC CATHETERIZATION  10/2002; 12/19/2004   [name] 03/08/2011   COLONOSCOPY  2013   [name]    CORONARY ARTERY BYPASS GRAFT  02/24/2003   CABG X2/notes 03/08/2011   CORONARY STENT INTERVENTION N/A 12/19/2019   Procedure: CORONARY STENT INTERVENTION;  Surgeon: Jettie Booze, MD;  Location: [place] CV LAB;  Service: Cardiovascular;  Laterality: N/A;   ENTEROSCOPY N/A 01/25/2021   Procedure: ENTEROSCOPY;  Surgeon: Doran Stabler, MD;  Location: [place];  Service: Gastroenterology;  Laterality: N/A;   ENTEROSCOPY N/A 02/13/2021   Procedure: ENTEROSCOPY;  Surgeon: Jackquline Denmark, MD;  Location: [facility] ENDOSCOPY;  Service: Endoscopy;  Laterality: N/A;   ENTEROSCOPY N/A 05/07/2021   Procedure: ENTEROSCOPY;  Surgeon: Yetta Flock, MD;  Location: [facility] ENDOSCOPY;  Service: Gastroenterology;  Laterality: N/A;   ESOPHAGOGASTRODUODENOSCOPY (EGD) WITH PROPOFOL N/A 02/08/2019   Procedure:  ESOPHAGOGASTRODUODENOSCOPY (EGD) WITH PROPOFOL;  Surgeon: Milus Banister, MD;  Location: [place];  Service: Gastroenterology;  Laterality: N/A;   ESOPHAGOGASTRODUODENOSCOPY (EGD) WITH PROPOFOL N/A 12/22/2019   Procedure: ESOPHAGOGASTRODUODENOSCOPY (EGD) WITH PROPOFOL;  Surgeon: Lavena Bullion, DO;  Location: [place];  Service: Gastroenterology;  Laterality: N/A;   ESOPHAGOGASTRODUODENOSCOPY (EGD) WITH PROPOFOL N/A 10/19/2020   Procedure: ESOPHAGOGASTRODUODENOSCOPY (EGD) WITH PROPOFOL;  Surgeon: Jackquline Denmark, MD;  Location: [facility] ENDOSCOPY;  Service: Endoscopy;  Laterality: N/A;   ESOPHAGOGASTRODUODENOSCOPY (EGD) WITH PROPOFOL N/A 12/22/2020   Procedure: ESOPHAGOGASTRODUODENOSCOPY (EGD) WITH PROPOFOL;  Surgeon: Gatha Mayer, MD;  Location: [place];  Service: Endoscopy;  Laterality: N/A;   ESOPHAGOGASTRODUODENOSCOPY (EGD) WITH PROPOFOL N/A 01/09/2021   Procedure: ESOPHAGOGASTRODUODENOSCOPY (EGD) WITH PROPOFOL;  Surgeon: Irene Shipper, MD;  Location: [facility] ENDOSCOPY;  Service: Endoscopy;  Laterality: N/A;   HEMOSTASIS CLIP PLACEMENT  12/22/2019   Procedure: HEMOSTASIS CLIP PLACEMENT;  Surgeon: Lavena Bullion, DO;  Location: [place];  Service: Gastroenterology;;   HEMOSTASIS CLIP PLACEMENT  12/22/2020  Procedure: HEMOSTASIS CLIP PLACEMENT;  Surgeon: Gatha Mayer, MD;  Location: [facility] ENDOSCOPY;  Service: Endoscopy;;   HEMOSTASIS CONTROL  12/22/2020   Procedure: HEMOSTASIS CONTROL/hemospray;  Surgeon: Gatha Mayer, MD;  Location: [place];  Service: Endoscopy;;   HOT HEMOSTASIS N/A 02/08/2019   Procedure: HOT HEMOSTASIS (ARGON PLASMA COAGULATION/BICAP);  Surgeon: Milus Banister, MD;  Location: [facility] ENDOSCOPY;  Service: Gastroenterology;  Laterality: N/A;   HOT HEMOSTASIS N/A 12/22/2019   Procedure: HOT HEMOSTASIS (ARGON PLASMA COAGULATION/BICAP);  Surgeon: Lavena Bullion, DO;  Location: [facility] ENDOSCOPY;  Service: Gastroenterology;  Laterality: N/A;   HOT HEMOSTASIS N/A  10/19/2020   Procedure: HOT HEMOSTASIS (ARGON PLASMA COAGULATION/BICAP);  Surgeon: Jackquline Denmark, MD;  Location: [facility] ENDOSCOPY;  Service: Endoscopy;  Laterality: N/A;   HOT HEMOSTASIS N/A 12/22/2020   Procedure: HOT HEMOSTASIS (ARGON PLASMA COAGULATION/BICAP);  Surgeon: Gatha Mayer, MD;  Location: [facility] ENDOSCOPY;  Service: Endoscopy;  Laterality: N/A;   HOT HEMOSTASIS N/A 01/09/2021   Procedure: HOT HEMOSTASIS (ARGON PLASMA COAGULATION/BICAP);  Surgeon: Irene Shipper, MD;  Location: [facility] ENDOSCOPY;  Service: Endoscopy;  Laterality: N/A;   HOT HEMOSTASIS N/A 01/25/2021   Procedure: HOT HEMOSTASIS (ARGON PLASMA COAGULATION/BICAP);  Surgeon: Doran Stabler, MD;  Location: [place];  Service: Gastroenterology;  Laterality: N/A;   HOT HEMOSTASIS N/A 02/13/2021   Procedure: HOT HEMOSTASIS (ARGON PLASMA COAGULATION/BICAP);  Surgeon: Jackquline Denmark, MD;  Location: [facility] ENDOSCOPY;  Service: Endoscopy;  Laterality: N/A;   HOT HEMOSTASIS N/A 05/07/2021   Procedure: HOT HEMOSTASIS (ARGON PLASMA COAGULATION/BICAP);  Surgeon: Yetta Flock, MD;  Location: [facility] ENDOSCOPY;  Service: Gastroenterology;  Laterality: N/A;   INTERTROCHANTERIC HIP FRACTURE SURGERY Left 11/2006   [name] 03/08/2011   INTRAVASCULAR ULTRASOUND/IVUS N/A 12/19/2019   Procedure: Intravascular Ultrasound/IVUS;  Surgeon: Jettie Booze, MD;  Location: [place] CV LAB;  Service: Cardiovascular;  Laterality: N/A;   IR FLUORO GUIDE CV LINE RIGHT  07/24/2019   IR FLUORO GUIDE CV LINE RIGHT  07/30/2019   IR US GUIDE VASC ACCESS RIGHT  07/24/2019   IR US GUIDE VASC ACCESS RIGHT  07/30/2019   LAPAROSCOPIC CHOLECYSTECTOMY  05/2006   LIGATION OF COMPETING BRANCHES OF ARTERIOVENOUS FISTULA Left 11/05/2018   Procedure: LIGATION OF COMPETING BRANCHES OF ARTERIOVENOUS FISTULA  LEFT  ARM;  Surgeon: Marty Heck, MD;  Location[facility] OR;  Service: Vascular;  Laterality: Left;   LUMBAR LAMINECTOMY/DECOMPRESSION MICRODISCECTOMY N/A 02/29/2016    Procedure: Left L4-5 Lateral Recess Decompression, Removal Extradural Intraspinal Facet Cyst;  Surgeon: Marybelle Killings, MD;  Location: [place];  Service: Orthopedics;  Laterality: N/A;   MULTIPLE TOOTH EXTRACTIONS     ORIF MANDIBULAR FRACTURE Left 08/13/2004   ORIF of left body fracture mandible with KLS Martin 2.3-mm six hole/notes 03/08/2011   RIGHT/LEFT HEART CATH AND CORONARY/GRAFT ANGIOGRAPHY N/A 12/19/2019   Procedure: RIGHT/LEFT HEART CATH AND CORONARY/GRAFT ANGIOGRAPHY;  Surgeon: Jettie Booze, MD;  Location: [place] CV LAB;  Service: Cardiovascular;  Laterality: N/A;   SCLEROTHERAPY  12/22/2019   Procedure: SCLEROTHERAPY;  Surgeon: Lavena Bullion, DO;  Location: [place] ENDOSCOPY;  Service: Gastroenterology;;   Clide Deutscher  02/13/2021   Procedure: Clide Deutscher;  Surgeon: Jackquline Denmark, MD;  Location: [facility] ENDOSCOPY;  Service: Endoscopy;;    Allergies  Allergen Reactions   Augmentin [Amoxicillin-Pot Clavulanate] Diarrhea and Other (See Comments)    Severe diarrhea   Mucinex Fast-Max Other (See Comments)    Intense sweating    Amphetamines Other (See Comments)    Unknown  reaction    Outpatient Encounter Medications as of 06/15/2021  Medication Sig   acetaminophen (TYLENOL) 650 MG CR tablet Take 1,300 mg by mouth 3 (three) times daily.   allopurinol (ZYLOPRIM) 100 MG tablet TAKE 1 TABLET(100 MG) BY MOUTH DAILY   ANORO ELLIPTA 62.5-25 MCG/INH AEPB INHALE 1 PUFF BY MOUTH EVERY DAY   B-D UF III MINI PEN NEEDLES 31G X 5 MM MISC USE FOUR TIMES DAILY   bictegravir-emtricitabine-tenofovir AF (BIKTARVY) 50-200-25 MG TABS tablet Take 1 tablet by mouth daily.   Calcium Acetate 667 MG TABS Take 667-1,334 mg by mouth See admin instructions. Take 1,334 mg by mouth three times a day with meals and 667 mg with each snack   carvedilol (COREG) 3.125 MG tablet TAKE 1 TABLET(3.125 MG) BY MOUTH TWICE DAILY   colchicine 0.6 MG tablet TAKE 1 TABLET BY MOUTH AS NEEDED FOR GOUT   diclofenac Sodium  (VOLTAREN) 1 % GEL APPLY 2 GRAMS TOPICALLY TO THE AFFECTED AREA THREE TIMES DAILY AS NEEDED FOR PAIN   folic acid (FOLVITE) 1 MG tablet Take 1 tablet (1 mg total) by mouth daily.   gabapentin (NEURONTIN) 300 MG capsule Take 1 capsule (300 mg total) by mouth daily. Dose change due to renal function   glucose blood (ONETOUCH ULTRA) test strip Check blood sugar three times daily. Dx:E11.40   insulin lispro (HUMALOG) 100 UNIT/ML KwikPen INJECT 13 UNITS UNDER THE SKIN THREE TIMES DAILY AS NEEDED FOR HIGH BLOOD SUGAR(ABOVE 150)   isosorbide mononitrate (IMDUR) 30 MG 24 hr tablet TAKE 1 TABLET BY MOUTH EVERY DAY   Lancets (ONETOUCH DELICA PLUS ZOXWRU04V) MISC Inject 1 Device as directed 3 (three) times daily. Dx: E11.40   latanoprost (XALATAN) 0.005 % ophthalmic solution Place 1 drop into both eyes at bedtime.   lidocaine-prilocaine (EMLA) cream Apply 1 application topically every Monday, Wednesday, and Friday with hemodialysis.   Methoxy PEG-Epoetin Beta (MIRCERA IJ) Mircera   multivitamin (RENA-VIT) TABS tablet Take 1 tablet by mouth daily.   nitroGLYCERIN (NITROSTAT) 0.3 MG SL tablet PLACE 1 TABLET UNDER THE TONGUE AS NEEDED FOR CHEST PAIN   ondansetron (ZOFRAN) 4 MG tablet Take 1 tablet (4 mg total) by mouth every 8 (eight) hours as needed for nausea or vomiting.   oxyCODONE (OXY IR/ROXICODONE) 5 MG immediate release tablet Take 1 tablet (5 mg total) by mouth every 4 (four) hours as needed for severe pain.   oxyCODONE-acetaminophen (PERCOCET/ROXICET) 5-325 MG tablet Take 1 tablet by mouth daily as needed for severe pain.   pantoprazole (PROTONIX) 40 MG tablet Take 1 tablet (40 mg total) by mouth 2 (two) times daily before a meal.   PFIZER-BIONT COVID-19 VAC-TRIS SUSP injection    polyethylene glycol (MIRALAX / GLYCOLAX) 17 g packet Take 17 g by mouth daily as needed for mild constipation.   timolol (TIMOPTIC) 0.5 % ophthalmic solution Place 1 drop into both eyes in the morning.   VITAMIN D PO Take by  mouth.   atorvastatin (LIPITOR) 10 MG tablet Take 1 tablet (10 mg total) by mouth daily.   No facility-administered encounter medications on file as of 06/15/2021.    Review of Systems  Constitutional:  Negative for appetite change, chills, fatigue, fever and unexpected weight change.  HENT:  Positive for rhinorrhea. Negative for congestion, dental problem, ear discharge, ear pain, facial swelling, hearing loss, nosebleeds, postnasal drip, sinus pressure, sinus pain, sneezing, sore throat, tinnitus and trouble swallowing.   Eyes:  Negative for pain, discharge, redness, itching and visual  disturbance.  Respiratory:  Positive for cough. Negative for chest tightness, shortness of breath and wheezing.   Cardiovascular:  Negative for chest pain, palpitations and leg swelling.  Gastrointestinal:  Positive for nausea and vomiting. Negative for abdominal distention, abdominal pain, blood in stool, constipation and diarrhea.  Genitourinary:  Negative for difficulty urinating, dysuria, flank pain, frequency and urgency.       On hemodialysis Mon,Wed and Friday   Musculoskeletal:  Negative for arthralgias, back pain, gait problem, joint swelling, myalgias, neck pain and neck stiffness.  Skin:  Negative for color change, pallor, rash and wound.  Neurological:  Negative for dizziness, syncope, speech difficulty, weakness, light-headedness, numbness and headaches.  Hematological:  Does not bruise/bleed easily.  Psychiatric/Behavioral:  Negative for agitation, behavioral problems, confusion, hallucinations and sleep disturbance. The patient is not nervous/anxious.    Immunization History  Administered Date(s) Administered   Fluad Quad(high Dose 65+) 06/14/2019   Hepatitis B 08/28/2006, 10/02/2007, 04/01/2008   Hepatitis B, adult 06/03/2014, 07/04/2014   Influenza Split 07/28/2011   Influenza Whole 08/28/2006, 09/10/2007, 09/15/2008, 08/03/2009, 07/26/2010   Influenza, High Dose Seasonal PF 07/04/2018    Influenza,inj,Quad PF,6+ Mos 07/04/2014, 07/06/2015, 07/12/2016, 07/11/2017   Influenza-Unspecified 06/24/2013, 06/30/2020   PFIZER(Purple Top)SARS-COV-2 Vaccination 11/16/2019, 12/07/2019, 07/23/2020   Pneumococcal Conjugate-13 08/18/2016   Pneumococcal Polysaccharide-23 08/28/2006, 07/28/2011, 05/30/2018   Tdap 06/14/2019   Zoster Recombinat (Shingrix) 07/24/2017, 01/09/2018   Zoster, Live 06/03/2014   Pertinent  Health Maintenance Due  Topic Date Due   OPHTHALMOLOGY EXAM  10/07/2020   FOOT EXAM  12/30/2020   INFLUENZA VACCINE  05/24/2021   HEMOGLOBIN A1C  08/03/2021   LIPID PANEL  05/13/2022   COLONOSCOPY (Pts 45-13yrs Insurance coverage will need to be confirmed)  10/01/2022   PNA vac Low Risk Adult  Completed   Fall Risk  06/15/2021 05/04/2021 01/12/2021 12/15/2020 12/14/2020  Falls in the past year? 0 0 0 0 1  Comment - - - - -  Number falls in past yr: 0 0 0 0 1  Comment - - - - -  Injury with Fall? 0 0 0 0 0  Comment - - - - -  Risk Factor Category  - - - - -  Risk for fall due to : No Fall Risks No Fall Risks - - History of fall(s)  Risk for fall due to: Comment - - - - -  Follow up Falls evaluation completed Falls evaluation completed - - -   Functional Status Survey:    Vitals:   06/15/21 1340 06/15/21 1433  BP: (!) 170/80 (!) 148/80  Pulse: (!) 104 88  Resp: 18   Temp: 98 F (36.7 C)   SpO2: 92%   Height: 5' 11.5" (1.816 m)    Body mass index is 24.4 kg/m.  Physical Exam Vitals reviewed.  Constitutional:      General: He is not in acute distress.    Appearance: Normal appearance. He is normal weight. He is not ill-appearing or diaphoretic.  HENT:     Head: Normocephalic.     Right Ear: Tympanic membrane, ear canal and external ear normal. There is no impacted cerumen.     Left Ear: Tympanic membrane, ear canal and external ear normal. There is no impacted cerumen.     Nose: Nose normal. No congestion or rhinorrhea.     Mouth/Throat:     Mouth: Mucous  membranes are moist.     Pharynx: Oropharynx is clear. No oropharyngeal exudate or posterior oropharyngeal erythema.  Eyes:     General: No scleral icterus.       Right eye: No discharge.        Left eye: No discharge.     Conjunctiva/sclera: Conjunctivae normal.     Pupils: Pupils are equal, round, and reactive to light.  Cardiovascular:     Rate and Rhythm: Normal rate and regular rhythm.     Pulses: Normal pulses.     Heart sounds: Normal heart sounds. No murmur heard.   No friction rub. No gallop.  Pulmonary:     Effort: Pulmonary effort is normal. No respiratory distress.     Breath sounds: No wheezing, rhonchi or rales.     Comments: Bilateral lung bases diminished  Chest:     Chest wall: No tenderness.  Abdominal:     General: Bowel sounds are normal. There is no distension.     Palpations: Abdomen is soft. There is no mass.     Tenderness: There is no abdominal tenderness. There is no right CVA tenderness, left CVA tenderness, guarding or rebound.  Musculoskeletal:        General: No swelling or tenderness. Normal range of motion.     Cervical back: Normal range of motion. No rigidity or tenderness.     Right lower leg: No edema.     Left lower leg: No edema.  Lymphadenopathy:     Cervical: No cervical adenopathy.  Skin:    General: Skin is warm and dry.     Coloration: Skin is not pale.     Findings: No erythema or rash.  Neurological:     Mental Status: He is alert and oriented to person, place, and time.     Cranial Nerves: No cranial nerve deficit.     Motor: No weakness.     Gait: Gait normal.  Psychiatric:        Mood and Affect: Mood normal.        Speech: Speech normal.        Behavior: Behavior normal.        Thought Content: Thought content normal.        Judgment: Judgment normal.    Labs reviewed: Recent Labs    05/03/21 2135 05/04/21 0734 05/05/21 0308 05/05/21 2314 05/06/21 1056 05/07/21 0406  NA 136 136   < > 134* 135 134*  K 4.4 5.4*    < > 4.3 4.0 4.0  CL 98 100   < > 99 98 98  CO2 27 25   < > 25 26 26   GLUCOSE 147* 117*   < > 108* 132* 110*  BUN 96* 115*   < > 61* 70* 79*  CREATININE 5.98* 6.96*   < > 5.80* 6.67* 7.71*  CALCIUM 8.0* 7.8*   < > 7.7* 7.5* 7.9*  MG 2.1 2.1  --   --   --  1.9  PHOS 4.2 5.3*  --   --  5.1* 5.9*   < > = values in this interval not displayed.   Recent Labs    05/02/21 0743 05/03/21 0046 05/06/21 1056 05/07/21 0406 05/13/21 0844  AST 22 25  --   --  17  ALT 15 15  --   --  12  ALKPHOS 44 31*  --   --  63  BILITOT 0.6 0.7  --   --  0.3  PROT 6.9 6.0*  --   --  7.2  ALBUMIN 3.1* 2.8* 3.1* 3.1* 4.0   Recent  Labs    02/14/21 0613 02/14/21 1836 02/25/21 1317 05/02/21 0743 05/03/21 0046 05/03/21 0101 05/05/21 2314 05/06/21 1056 05/07/21 0406 05/18/21 1702  WBC 8.3   < > 6.6   < > 6.5   < > 5.1 4.6 4.4  --   NEUTROABS 5.8  --  3,967  --  4.7  --   --   --   --   --   HGB 8.0*   < > 10.4*   < > 7.0*   < > 8.0* 7.7* 8.0* 10.2*  HCT 23.9*   < > 33.1*   < > 22.7*   < > 23.9* 23.8* 24.5*  --   MCV 90.2   < > 93.2   < > 92.3   < > 88.2 91.2 92.1  --   PLT 79*   < > 198   < > 101*   < > 82* 81* 84*  --    < > = values in this interval not displayed.   Lab Results  Component Value Date   TSH 0.49 12/15/2020   Lab Results  Component Value Date   HGBA1C 5.8 (H) 05/03/2021   Lab Results  Component Value Date   CHOL 111 05/13/2021   HDL 32 (L) 05/13/2021   LDLCALC 58 05/13/2021   TRIG 111 05/13/2021   CHOLHDL 3.5 05/13/2021    Significant Diagnostic Results in last 30 days:  No results found.  Assessment/Plan  1. Cough Afebrile.reports coughed small P[place]. Shortness of breath with exertion from walking from the car to the office.Not shortness of breath at rest.  Dg chest 2 view ordered but patient decline to get X-ray done today states transportation will not take to imaging.[place] secretary Willette Brace contacted [facility] transportation but was told unable to take  patient to [place] since imaging is not under [facility].patient state will arrange for transportation.  - SARS-COV-2 RNA,(COVID-19) QUAL NAAT  2. Runny nose Will rule out COVID-19  - SARS-COV-2 RNA,(COVID-19) QUAL NAAT  3. Nausea and vomiting, intractability of vomiting not specified, unspecified vomiting type Had N/V on Saturday but none since then. Abdomen non-distended ,non-tender,soft with x 4 bowel sound present.  - Zofran 4 mg tablet one by mouth every 8 hrs as needed for N/V - advised to notify provider or go to ED if symptoms worsen.  - SARS-COV-2 RNA,(COVID-19) QUAL NAAT  Family/ staff Communication: Reviewed plan of care with patient verbalized understanding   Labs/tests ordered:   - Dg chest X-ray 2 views   - SARS-COV-2 RNA,(COVID-19) QUAL NAAT  Next Appointment: As needed if symptoms worsen or fail to improve    [name], NP

## 2021-06-15 NOTE — Telephone Encounter (Signed)
Cardiology telephone note dated June 11, 2021 states that patient called requesting not to be cleared for surgery from a cardiac standpoint.

## 2021-06-15 NOTE — Patient Instructions (Signed)
Please get - Please [facility] abdominal [facility] [place] then will call you with results.

## 2021-06-16 ENCOUNTER — Ambulatory Visit: Payer: Medicare Other | Admitting: Gastroenterology

## 2021-06-16 ENCOUNTER — Telehealth: Payer: Self-pay

## 2021-06-16 LAB — SARS-COV-2 RNA,(COVID-19) QUALITATIVE NAAT: SARS CoV2 RNA: NOT DETECTED

## 2021-06-16 NOTE — Telephone Encounter (Signed)
Patient called and wanted to know when Covid-19 test results would be back? I  notified patient that results come back within 2-3 days from testing date. Patient understood and then asked question about medication from yesterday "Zofran". Patient states that he's taking the Zofran for nausea as prescribed and wanted to know if it was safe to take his Oxycodone 5mg  with it? Because he's having some back pain. Message routed to Marlowe Sax, NP. Please Advise.

## 2021-06-16 NOTE — Telephone Encounter (Signed)
Take 30 minutes  to one hour apart from oxycodone to prevent drowsiness.

## 2021-06-16 NOTE — Telephone Encounter (Signed)
Patient called and notified.

## 2021-06-17 ENCOUNTER — Other Ambulatory Visit: Payer: Self-pay

## 2021-06-17 ENCOUNTER — Ambulatory Visit (INDEPENDENT_AMBULATORY_CARE_PROVIDER_SITE_OTHER): Payer: Medicare Other | Admitting: Podiatry

## 2021-06-17 ENCOUNTER — Telehealth: Payer: Self-pay | Admitting: Specialist

## 2021-06-17 DIAGNOSIS — Z8631 Personal history of diabetic foot ulcer: Secondary | ICD-10-CM

## 2021-06-17 DIAGNOSIS — E0842 Diabetes mellitus due to underlying condition with diabetic polyneuropathy: Secondary | ICD-10-CM

## 2021-06-17 DIAGNOSIS — L84 Corns and callosities: Secondary | ICD-10-CM

## 2021-06-17 NOTE — Telephone Encounter (Signed)
I called and advised patient that per Jeneen Rinks, we will not be giving him anymore narcotics at this point due to him calling the Cardiologist office asking them to deny his clearance for surgery because he doesn't want the surgery. I advised that if he is refusing surgery that he would need to be referred to pain management for his medication as we cannot provide pain meds long term.  He states that his PCP was going to be doing that, he states that the tramadol would not even touch his pain, said to just keep it.  He said that his niece is a Marine scientist and that she told him this surgery would put him in a wheelchair for the rest of his life. He states that he has called and cancelled his appt for 06/17/21.

## 2021-06-17 NOTE — Telephone Encounter (Signed)
Contacted patient and discussed in detail that since surgery was canceled as he requested through his cardiologist pain medication would not be prescribed to him. If any medication is needed for pain, he would need to reach out to PCP to see if this is able to be managed. Patient understood and had no further questions or concerns.

## 2021-06-17 NOTE — Telephone Encounter (Signed)
Pt called and would like christy to call him. PT states he will be able to answer until 3 then he has a foot doctor appt.   CB [phone]

## 2021-06-18 DIAGNOSIS — N186 End stage renal disease: Secondary | ICD-10-CM | POA: Diagnosis not present

## 2021-06-18 DIAGNOSIS — Z992 Dependence on renal dialysis: Secondary | ICD-10-CM | POA: Diagnosis not present

## 2021-06-18 DIAGNOSIS — D631 Anemia in chronic kidney disease: Secondary | ICD-10-CM | POA: Diagnosis not present

## 2021-06-18 DIAGNOSIS — D509 Iron deficiency anemia, unspecified: Secondary | ICD-10-CM | POA: Diagnosis not present

## 2021-06-18 DIAGNOSIS — N2581 Secondary hyperparathyroidism of renal origin: Secondary | ICD-10-CM | POA: Diagnosis not present

## 2021-06-21 ENCOUNTER — Encounter: Payer: Self-pay | Admitting: Podiatry

## 2021-06-21 NOTE — Progress Notes (Signed)
  Subjective:  [name], male    DOB: Aug 04, 1951,  MRN: [id number]  Chief Complaint  Patient presents with   Diabetic Ulcer    follow up partial thickness ulcer plantar right foot.    70 y.o. male presents with the above complaint. History confirmed with patient.  The same area that was previously callus is bothersome again but seems to be healed and not draining  Objective:  Physical Exam: warm, good capillary refill, no trophic changes or ulcerative lesions, and normal DP and PT pulses.  Right foot submetatarsal 5 there is a previous ulcer that has now healed and fully epithelialized with mild hyperkeratosis Assessment:   1. Pre-ulcerative calluses   2. Diabetes mellitus due to underlying condition with diabetic polyneuropathy, unspecified whether long term insulin use (Jacob [name])   3. Personal history of diabetic foot ulcer      Plan:  Patient was evaluated and treated and all questions answered.  Delete this wound has healed return to see me as needed for this if this returns or further issues develop  No follow-ups on file.

## 2021-06-22 ENCOUNTER — Other Ambulatory Visit: Payer: Self-pay | Admitting: Family

## 2021-06-22 ENCOUNTER — Telehealth: Payer: Self-pay

## 2021-06-22 ENCOUNTER — Other Ambulatory Visit: Payer: Self-pay

## 2021-06-22 ENCOUNTER — Other Ambulatory Visit: Payer: Medicare Other

## 2021-06-22 ENCOUNTER — Ambulatory Visit (INDEPENDENT_AMBULATORY_CARE_PROVIDER_SITE_OTHER): Payer: Medicare Other | Admitting: Nurse Practitioner

## 2021-06-22 ENCOUNTER — Ambulatory Visit
Admission: RE | Admit: 2021-06-22 | Discharge: 2021-06-22 | Disposition: A | Discharge status: Home / Self Care | Payer: Medicare Other | Source: Ambulatory Visit | Attending: Family | Admitting: Family

## 2021-06-22 DIAGNOSIS — R0602 Shortness of breath: Secondary | ICD-10-CM | POA: Diagnosis not present

## 2021-06-22 DIAGNOSIS — R059 Cough, unspecified: Secondary | ICD-10-CM

## 2021-06-22 DIAGNOSIS — J189 Pneumonia, unspecified organism: Secondary | ICD-10-CM | POA: Diagnosis not present

## 2021-06-22 MED ORDER — AZITHROMYCIN 250 MG PO TABS: ORAL_TABLET | ORAL | 0 refills | Status: AC

## 2021-06-22 MED ORDER — CEFDINIR 300 MG PO CAPS: ORAL_CAPSULE | ORAL | 0 refills | Status: DC

## 2021-06-22 NOTE — Progress Notes (Signed)
This service is provided via telemedicine  No vital signs collected/recorded due to the encounter was a telemedicine visit.   Location of patient (ex: home, work):  Home  Patient consents to a telephone visit:  Yes, see encounter dated 06/22/2021  Location of the provider (ex: office, home):  [place]  Name of any referring provider:  N/A  Names of all persons participating in the telemedicine service and their role in the encounter:  Sherrie Mustache, Nurse Practitioner, Carroll Kinds, CMA, and patient.   Time spent on call:   minutes with medical assistant

## 2021-06-22 NOTE — Progress Notes (Signed)
Careteam: Patient Care Team: [name], NP as PCP - General (Geriatric Medicine) [name], MD as PCP - Infectious Diseases (Infectious Diseases) [name], MD as PCP - Cardiology (Cardiology) [name], MD as Consulting Physician (Ophthalmology) [name], MD as Consulting Physician (Cardiology) [name], MD as Consulting Physician (Infectious Diseases) Gardiner Barefoot, DPM as Consulting Physician (Podiatry) [name], Nelda Bucks, NP as Nurse Practitioner (Family Medicine) [place], [name]  Advanced Directive information    Allergies  Allergen Reactions   Augmentin [Amoxicillin-Pot Clavulanate] Diarrhea and Other (See Comments)    Severe diarrhea   Mucinex Fast-Max Other (See Comments)    Intense sweating    Amphetamines Other (See Comments)    Unknown reaction    Chief Complaint  Patient presents with   Acute Visit    Patient complains cough and congestion has not gotten any better. Patient has been having these symptoms since last Tuesday. Seems to have improved some. Discuss chest xray results.     HPI: Patient is a 70 y.o. male due to cough and congestion for 1 week. Has not been feeling well. With some mild shortness of breath. Worse last night. He has ESRD on HD Monday, Wednesday, Friday.  Reports wheezing last week but not currently having wheezing.  Mild sore throat and a lot of nasal congestion. Cough and chest congestion.   Review of Systems:  Review of Systems  Constitutional:  Positive for malaise/fatigue. Negative for chills and fever.  HENT:  Positive for congestion and sore throat. Negative for ear discharge, hearing loss and nosebleeds.   Respiratory:  Positive for cough, sputum production and shortness of breath. Negative for wheezing.   Cardiovascular:  Negative for chest pain and leg swelling.   Past Medical History:  Diagnosis Date   Acute respiratory failure ([place]) 03/01/2018   Anemia    Arthritis    "all over;  mostly knees and back" (02/28/2018)   Chronic combined systolic and diastolic CHF (congestive heart failure) (HCC)    Chronic lower back pain    stenosis   Community acquired pneumonia 09/06/2013   COPD (chronic obstructive pulmonary disease) ([place])    Coronary atherosclerosis of native coronary artery    a. 02/2003 s/p CABG x 2 (VG->RI, VG->RPDA; b. 11/2019 PCI: LM nl, LAD 90d, D3 50, RI 100, LCX 100p, OM3 100 - fills via L->L collats from D2/dLAD, RCA 100p, VG->RPDA ok, VG->RI 95 (3.5x48 Synergy XD DES).   Drug abuse ([place])    hx; tested for cocaine as recently as 2/08. says he is not using drugs now - avoided defib. for this reason    ESRD (end stage renal disease) ([place])    Hemo M-W-F- [facility]   Fall at home 10/2020   GERD (gastroesophageal reflux disease)    takes OTC meds as needed   GI bleeding    a. 11/2019 EGD: angiodysplastic lesions w/ bleeding s/p argon plasma/clipping/epi inj. Multiple admissions for the same.   Glaucoma    uses eye drops daily   Hepatitis B 1968   "tx'd w/isolation; caught it from toilet stools in gym"   History of blood transfusion 03/01/2019   History of colon polyps    benign   History of gout    takes Allopurinol daily as well as Colchicine-if needed (02/28/2018)   History of kidney stones    HTN (hypertension)    takes Coreg,Imdur.and Apresoline daily   Human immunodeficiency virus (HIV) disease ([place]) dx'd 1995   on  Biktarvy as of 12/2020.     Hyperlipidemia    Ischemic cardiomyopathy    a. 01/2019 Echo: EF 40-45%, diffuse HK, mild basal septal hypertrophy. Diast dysfxn. Nl RV size/fxn. Sev dil LA. Triv MR/TR/PR.   Muscle spasm    takes Zanaflex as needed   Myocardial infarction ([place]) ~ 2004/2005   Nocturia    Peripheral neuropathy    takes gabapentin daily   Pneumonia    "at least twice" (02/28/2018)   SDH (subdural hematoma) (HCC)    Syphilis, unspecified    Type II diabetes mellitus ([place]) 2004   Lantus daily.Average fasting blood sugar 125-199    Wears glasses    Wears partial dentures    Past Surgical History:  Procedure Laterality Date   AV FISTULA PLACEMENT Left 08/02/2018   Procedure: ARTERIOVENOUS (AV) FISTULA CREATION  left arm radiocephlic;  Surgeon: Marty Heck, MD;  Location: [place];  Service: Vascular;  Laterality: Left;   AV FISTULA PLACEMENT Left 08/01/2019   Procedure: LEFT BRACHIOCEPHALIC ARTERIOVENOUS (AV) FISTULA CREATION;  Surgeon: [name], MD;  Location: [place];  Service: Vascular;  Laterality: Left;   [place] Left 10/03/2019   Procedure: BASILIC VEIN TRANSPOSITION LEFT SECOND STAGE;  Surgeon: [name], MD;  Location: [place];  Service: Vascular;  Laterality: Left;   BIOPSY  01/25/2021   Procedure: BIOPSY;  Surgeon: Doran Stabler, MD;  Location: [facility] ENDOSCOPY;  Service: Gastroenterology;;   CARDIAC CATHETERIZATION  10/2002; 12/19/2004   [name] 03/08/2011   COLONOSCOPY  2013   [place]    CORONARY ARTERY BYPASS GRAFT  02/24/2003   CABG X2/notes 03/08/2011   CORONARY STENT INTERVENTION N/A 12/19/2019   Procedure: CORONARY STENT INTERVENTION;  Surgeon: Jettie Booze, MD;  Location: [place] CV LAB;  Service: Cardiovascular;  Laterality: N/A;   ENTEROSCOPY N/A 01/25/2021   Procedure: ENTEROSCOPY;  Surgeon: Doran Stabler, MD;  Location: [place];  Service: Gastroenterology;  Laterality: N/A;   ENTEROSCOPY N/A 02/13/2021   Procedure: ENTEROSCOPY;  Surgeon: Jackquline Denmark, MD;  Location: [facility] ENDOSCOPY;  Service: Endoscopy;  Laterality: N/A;   ENTEROSCOPY N/A 05/07/2021   Procedure: ENTEROSCOPY;  Surgeon: Yetta Flock, MD;  Location: [facility] ENDOSCOPY;  Service: Gastroenterology;  Laterality: N/A;   ESOPHAGOGASTRODUODENOSCOPY (EGD) WITH PROPOFOL N/A 02/08/2019   Procedure: ESOPHAGOGASTRODUODENOSCOPY (EGD) WITH PROPOFOL;  Surgeon: Milus Banister, MD;  Location: [place];  Service: Gastroenterology;  Laterality: N/A;   ESOPHAGOGASTRODUODENOSCOPY (EGD) WITH  PROPOFOL N/A 12/22/2019   Procedure: ESOPHAGOGASTRODUODENOSCOPY (EGD) WITH PROPOFOL;  Surgeon: Lavena Bullion, DO;  Location: [place];  Service: Gastroenterology;  Laterality: N/A;   ESOPHAGOGASTRODUODENOSCOPY (EGD) WITH PROPOFOL N/A 10/19/2020   Procedure: ESOPHAGOGASTRODUODENOSCOPY (EGD) WITH PROPOFOL;  Surgeon: Jackquline Denmark, MD;  Location: [facility] ENDOSCOPY;  Service: Endoscopy;  Laterality: N/A;   ESOPHAGOGASTRODUODENOSCOPY (EGD) WITH PROPOFOL N/A 12/22/2020   Procedure: ESOPHAGOGASTRODUODENOSCOPY (EGD) WITH PROPOFOL;  Surgeon: Gatha Mayer, MD;  Location: [place];  Service: Endoscopy;  Laterality: N/A;   ESOPHAGOGASTRODUODENOSCOPY (EGD) WITH PROPOFOL N/A 01/09/2021   Procedure: ESOPHAGOGASTRODUODENOSCOPY (EGD) WITH PROPOFOL;  Surgeon: Irene Shipper, MD;  Location: [facility] ENDOSCOPY;  Service: Endoscopy;  Laterality: N/A;   HEMOSTASIS CLIP PLACEMENT  12/22/2019   Procedure: HEMOSTASIS CLIP PLACEMENT;  Surgeon: Lavena Bullion, DO;  Location: [place];  Service: Gastroenterology;;   HEMOSTASIS CLIP PLACEMENT  12/22/2020   Procedure: HEMOSTASIS CLIP PLACEMENT;  Surgeon: Gatha Mayer, MD;  Location: [place];  Service: Endoscopy;;   HEMOSTASIS  CONTROL  12/22/2020   Procedure: HEMOSTASIS CONTROL/hemospray;  Surgeon: Gatha Mayer, MD;  Location: [place];  Service: Endoscopy;;   HOT HEMOSTASIS N/A 02/08/2019   Procedure: HOT HEMOSTASIS (ARGON PLASMA COAGULATION/BICAP);  Surgeon: Milus Banister, MD;  Location: [facility] ENDOSCOPY;  Service: Gastroenterology;  Laterality: N/A;   HOT HEMOSTASIS N/A 12/22/2019   Procedure: HOT HEMOSTASIS (ARGON PLASMA COAGULATION/BICAP);  Surgeon: Lavena Bullion, DO;  Location: [facility] ENDOSCOPY;  Service: Gastroenterology;  Laterality: N/A;   HOT HEMOSTASIS N/A 10/19/2020   Procedure: HOT HEMOSTASIS (ARGON PLASMA COAGULATION/BICAP);  Surgeon: Jackquline Denmark, MD;  Location: [facility] ENDOSCOPY;  Service: Endoscopy;  Laterality: N/A;   HOT HEMOSTASIS N/A 12/22/2020    Procedure: HOT HEMOSTASIS (ARGON PLASMA COAGULATION/BICAP);  Surgeon: Gatha Mayer, MD;  Location: [facility] ENDOSCOPY;  Service: Endoscopy;  Laterality: N/A;   HOT HEMOSTASIS N/A 01/09/2021   Procedure: HOT HEMOSTASIS (ARGON PLASMA COAGULATION/BICAP);  Surgeon: Irene Shipper, MD;  Location: [facility] ENDOSCOPY;  Service: Endoscopy;  Laterality: N/A;   HOT HEMOSTASIS N/A 01/25/2021   Procedure: HOT HEMOSTASIS (ARGON PLASMA COAGULATION/BICAP);  Surgeon: Doran Stabler, MD;  Location: [place];  Service: Gastroenterology;  Laterality: N/A;   HOT HEMOSTASIS N/A 02/13/2021   Procedure: HOT HEMOSTASIS (ARGON PLASMA COAGULATION/BICAP);  Surgeon: Jackquline Denmark, MD;  Location: [facility] ENDOSCOPY;  Service: Endoscopy;  Laterality: N/A;   HOT HEMOSTASIS N/A 05/07/2021   Procedure: HOT HEMOSTASIS (ARGON PLASMA COAGULATION/BICAP);  Surgeon: Yetta Flock, MD;  Location: [facility] ENDOSCOPY;  Service: Gastroenterology;  Laterality: N/A;   INTERTROCHANTERIC HIP FRACTURE SURGERY Left 11/2006   [name] 03/08/2011   INTRAVASCULAR ULTRASOUND/IVUS N/A 12/19/2019   Procedure: Intravascular Ultrasound/IVUS;  Surgeon: Jettie Booze, MD;  Location: [place] CV LAB;  Service: Cardiovascular;  Laterality: N/A;   IR FLUORO GUIDE CV LINE RIGHT  07/24/2019   IR FLUORO GUIDE CV LINE RIGHT  07/30/2019   IR US GUIDE VASC ACCESS RIGHT  07/24/2019   IR US GUIDE VASC ACCESS RIGHT  07/30/2019   LAPAROSCOPIC CHOLECYSTECTOMY  05/2006   LIGATION OF COMPETING BRANCHES OF ARTERIOVENOUS FISTULA Left 11/05/2018   Procedure: LIGATION OF COMPETING BRANCHES OF ARTERIOVENOUS FISTULA  LEFT  ARM;  Surgeon: Marty Heck, MD;  Location: [facility] OR;  Service: Vascular;  Laterality: Left;   LUMBAR LAMINECTOMY/DECOMPRESSION MICRODISCECTOMY N/A 02/29/2016   Procedure: Left L4-5 Lateral Recess Decompression, Removal Extradural Intraspinal Facet Cyst;  Surgeon: Marybelle Killings, MD;  Location: [place];  Service: Orthopedics;  Laterality: N/A;   MULTIPLE TOOTH  EXTRACTIONS     ORIF MANDIBULAR FRACTURE Left 08/13/2004   ORIF of left body fracture mandible with KLS Martin 2.3-mm six hole/notes 03/08/2011   RIGHT/LEFT HEART CATH AND CORONARY/GRAFT ANGIOGRAPHY N/A 12/19/2019   Procedure: RIGHT/LEFT HEART CATH AND CORONARY/GRAFT ANGIOGRAPHY;  Surgeon: Jettie Booze, MD;  Location: [place] CV LAB;  Service: Cardiovascular;  Laterality: N/A;   SCLEROTHERAPY  12/22/2019   Procedure: SCLEROTHERAPY;  Surgeon: Lavena Bullion, DO;  Location: [place];  Service: Gastroenterology;;   Clide Deutscher  02/13/2021   Procedure: Clide Deutscher;  Surgeon: Jackquline Denmark, MD;  Location: [facility] ENDOSCOPY;  Service: Endoscopy;;   Social History:   reports that he has been smoking cigarettes. He has a 21.50 pack-year smoking history. He has never used smokeless tobacco. He reports that he does not currently use alcohol after a past usage of about 12.0 standard drinks per week. He reports that he does not currently use drugs after having used the following drugs: Cocaine.  Family History  Problem Relation Age of Onset   Heart failure Father    Hypertension Father    Diabetes Brother    Heart attack Brother    Alzheimer's disease Mother    Stroke Sister    Diabetes Sister    Alzheimer's disease Sister    Hypertension Brother    Diabetes Brother    Drug abuse Brother    Colon cancer Neg Hx     Medications: Patient's Medications  New Prescriptions   No medications on file  Previous Medications   ACETAMINOPHEN (TYLENOL) 650 MG CR TABLET    Take 1,300 mg by mouth 3 (three) times daily.   ALLOPURINOL (ZYLOPRIM) 100 MG TABLET    TAKE 1 TABLET(100 MG) BY MOUTH DAILY   ANORO ELLIPTA 62.5-25 MCG/INH AEPB    INHALE 1 PUFF BY MOUTH EVERY DAY   ATORVASTATIN (LIPITOR) 10 MG TABLET    Take 1 tablet (10 mg total) by mouth daily.   B-D UF III MINI PEN NEEDLES 31G X 5 MM MISC    USE FOUR TIMES DAILY   BICTEGRAVIR-EMTRICITABINE-TENOFOVIR AF (BIKTARVY) 50-200-25 MG TABS  TABLET    Take 1 tablet by mouth daily.   CALCIUM ACETATE 667 MG TABS    Take 667-1,334 mg by mouth See admin instructions. Take 1,334 mg by mouth three times a day with meals and 667 mg with each snack   CARVEDILOL (COREG) 3.125 MG TABLET    TAKE 1 TABLET(3.125 MG) BY MOUTH TWICE DAILY   COLCHICINE 0.6 MG TABLET    TAKE 1 TABLET BY MOUTH AS NEEDED FOR GOUT   DICLOFENAC SODIUM (VOLTAREN) 1 % GEL    APPLY 2 GRAMS TOPICALLY TO THE AFFECTED AREA THREE TIMES DAILY AS NEEDED FOR PAIN   FOLIC ACID (FOLVITE) 1 MG TABLET    Take 1 tablet (1 mg total) by mouth daily.   GABAPENTIN (NEURONTIN) 300 MG CAPSULE    Take 1 capsule (300 mg total) by mouth daily. Dose change due to renal function   GLUCOSE BLOOD (ONETOUCH ULTRA) TEST STRIP    Check blood sugar three times daily. Dx:E11.40   INSULIN LISPRO (HUMALOG) 100 UNIT/ML KWIKPEN    INJECT 13 UNITS UNDER THE SKIN THREE TIMES DAILY AS NEEDED FOR HIGH BLOOD SUGAR(ABOVE 150)   ISOSORBIDE MONONITRATE (IMDUR) 30 MG 24 HR TABLET    TAKE 1 TABLET BY MOUTH EVERY DAY   LANCETS (ONETOUCH DELICA PLUS [id number]) MISC    Inject 1 Device as directed 3 (three) times daily. Dx: E11.40   LATANOPROST (XALATAN) 0.005 % OPHTHALMIC SOLUTION    Place 1 drop into both eyes at bedtime.   LIDOCAINE-PRILOCAINE (EMLA) CREAM    Apply 1 application topically every Monday, Wednesday, and Friday with hemodialysis.   METHOXY PEG-EPOETIN BETA (MIRCERA IJ)    Mircera   MULTIVITAMIN (RENA-VIT) TABS TABLET    Take 1 tablet by mouth daily.   NITROGLYCERIN (NITROSTAT) 0.3 MG SL TABLET    PLACE 1 TABLET UNDER THE TONGUE AS NEEDED FOR CHEST PAIN   ONDANSETRON (ZOFRAN) 4 MG TABLET    Take 1 tablet (4 mg total) by mouth every 8 (eight) hours as needed for nausea or vomiting.   OXYCODONE (OXY IR/ROXICODONE) 5 MG IMMEDIATE RELEASE TABLET    Take 1 tablet (5 mg total) by mouth every 4 (four) hours as needed for severe pain.   OXYCODONE-ACETAMINOPHEN (PERCOCET/ROXICET) 5-325 MG TABLET    Take 1 tablet by  mouth daily as needed for severe pain.   PANTOPRAZOLE (  PROTONIX) 40 MG TABLET    Take 1 tablet (40 mg total) by mouth 2 (two) times daily before a meal.   POLYETHYLENE GLYCOL (MIRALAX / GLYCOLAX) 17 G PACKET    Take 17 g by mouth daily as needed for mild constipation.   TIMOLOL (TIMOPTIC) 0.5 % OPHTHALMIC SOLUTION    Place 1 drop into both eyes in the morning.   VITAMIN D PO    Take by mouth.  Modified Medications   No medications on file  Discontinued Medications   PFIZER-BIONT COVID-19 VAC-TRIS SUSP INJECTION        Physical Exam:  There were no vitals filed for this visit. There is no height or weight on file to calculate BMI. Wt Readings from Last 3 Encounters:  06/03/21 177 lb 6.4 oz (80.5 kg)  05/18/21 177 lb 6 oz (80.5 kg)  05/11/21 172 lb (78 kg)      Labs reviewed: Basic Metabolic Panel: Recent Labs    10/17/20 0139 10/17/20 1401 12/15/20 1105 12/21/20 0834 05/03/21 2135 05/04/21 0734 05/05/21 0308 05/05/21 2314 05/06/21 1056 05/07/21 0406  NA  --    < > 138   < > 136 136   < > 134* 135 134*  K  --    < > 4.0   < > 4.4 5.4*   < > 4.3 4.0 4.0  CL  --    < > 95*   < > 98 100   < > 99 98 98  CO2  --    < > 29   < > 27 25   < > 25 26 26   GLUCOSE  --    < > 112*   < > 147* 117*   < > 108* 132* 110*  BUN  --    < > 51*   < > 96* 115*   < > 61* 70* 79*  CREATININE 8.61*   < > 5.77*   < > 5.98* 6.96*   < > 5.80* 6.67* 7.71*  CALCIUM  --    < > 8.7   < > 8.0* 7.8*   < > 7.7* 7.5* 7.9*  MG  --    < >  --    < > 2.1 2.1  --   --   --  1.9  PHOS  --    < >  --    < > 4.2 5.3*  --   --  5.1* 5.9*  TSH 0.748  --  0.49  --   --   --   --   --   --   --    < > = values in this interval not displayed.   Liver Function Tests: Recent Labs    05/02/21 0743 05/03/21 0046 05/06/21 1056 05/07/21 0406 05/13/21 0844  AST 22 25  --   --  17  ALT 15 15  --   --  12  ALKPHOS 44 31*  --   --  63  BILITOT 0.6 0.7  --   --  0.3  PROT 6.9 6.0*  --   --  7.2  ALBUMIN 3.1* 2.8*  3.1* 3.1* 4.0   Recent Labs    10/16/20 1807 12/21/20 0834 05/02/21 0743  LIPASE 70* 73* 87*   Recent Labs    05/05/21 1708  AMMONIA 49*   CBC: Recent Labs    02/14/21 4650 02/14/21 1836 02/25/21 1317 05/02/21 0743 05/03/21 0046 05/03/21 0101 05/05/21 2314 05/06/21 1056  05/07/21 0406 05/18/21 1702  WBC 8.3   < > 6.6   < > 6.5   < > 5.1 4.6 4.4  --   NEUTROABS 5.8  --  3,967  --  4.7  --   --   --   --   --   HGB 8.0*   < > 10.4*   < > 7.0*   < > 8.0* 7.7* 8.0* 10.2*  HCT 23.9*   < > 33.1*   < > 22.7*   < > 23.9* 23.8* 24.5*  --   MCV 90.2   < > 93.2   < > 92.3   < > 88.2 91.2 92.1  --   PLT 79*   < > 198   < > 101*   < > 82* 81* 84*  --    < > = values in this interval not displayed.   Lipid Panel: Recent Labs    12/15/20 1105 05/13/21 0844  CHOL 143 111  HDL 31* 32*  LDLCALC 79 58  TRIG 248* 111  CHOLHDL 4.6 3.5   TSH: Recent Labs    10/17/20 0139 12/15/20 1105  TSH 0.748 0.49   A1C: Lab Results  Component Value Date   HGBA1C 5.8 (H) 05/03/2021     Assessment/Plan 1. Pneumonia of both upper lobes due to infectious organism -encouraged to stay hydrated -Mucinex daily  -continue to inhaler - azithromycin (ZITHROMAX) 250 MG tablet; Take 2 tablets on day 1, then 1 tablet daily on days 2 through 5  Dispense: 6 tablet; Refill: 0 - cefdinir (OMNICEF) 300 MG capsule; 1 tablet by mouth every other day after dialysis  Dispense: 3 capsule; Refill: 0 -strict return precautions given.   [name]. Harle Battiest  [facility] [phone]    Virtual Visit via telephone  I connected with patient on 06/22/21 at  1:30 PM EDT by telephone and verified that I am speaking with the correct person using two identifiers.  Location: Patient: home Provider: [place]   I discussed the limitations, risks, security and privacy concerns of performing an evaluation and management service by telephone and the availability of in person  appointments. I also discussed with the patient that there may be a patient responsible charge related to this service. The patient expressed understanding and agreed to proceed.   I discussed the assessment and treatment plan with the patient. The patient was provided an opportunity to ask questions and all were answered. The patient agreed with the plan and demonstrated an understanding of the instructions.   The patient was advised to call back or seek an in-person evaluation if the symptoms worsen or if the condition fails to improve as anticipated.  I provided 18 minutes of non-face-to-face time during this encounter.  [name]. Harle Battiest Avs printed and mailed

## 2021-06-22 NOTE — Telephone Encounter (Signed)
[name], ryner are scheduled for a virtual visit with your provider today.    Just as we do with appointments in the office, we must obtain your consent to participate.  Your consent will be active for this visit and any virtual visit you may have with one of our providers in the next 365 days.    If you have a MyChart account, I can also send a copy of this consent to you electronically.  All virtual visits are billed to your insurance company just like a traditional visit in the office.  As this is a virtual visit, video technology does not allow for your provider to perform a traditional examination.  This may limit your provider's ability to fully assess your condition.  If your provider identifies any concerns that need to be evaluated in person or the need to arrange testing such as labs, EKG, etc, we will make arrangements to do so.    Although advances in technology are sophisticated, we cannot ensure that it will always work on either your end or our end.  If the connection with a video visit is poor, we may have to switch to a telephone visit.  With either a video or telephone visit, we are not always able to ensure that we have a secure connection.   I need to obtain your verbal consent now.   Are you willing to proceed with your visit today?   Jacob [name] has provided verbal consent on 06/22/2021 for a virtual visit (video or telephone).   Carroll Kinds, CMA 06/22/2021  1:52 PM

## 2021-06-23 ENCOUNTER — Telehealth: Payer: Self-pay | Admitting: *Deleted

## 2021-06-23 ENCOUNTER — Other Ambulatory Visit: Payer: Self-pay

## 2021-06-23 ENCOUNTER — Emergency Department (HOSPITAL_COMMUNITY)
Admission: EM | Admit: 2021-06-23 | Discharge: 2021-06-24 | Disposition: A | Discharge status: Home / Self Care | Payer: Medicare Other | Attending: Emergency Medicine | Admitting: Emergency Medicine

## 2021-06-23 ENCOUNTER — Emergency Department (HOSPITAL_COMMUNITY): Payer: Medicare Other

## 2021-06-23 ENCOUNTER — Encounter (HOSPITAL_COMMUNITY): Payer: Self-pay

## 2021-06-23 DIAGNOSIS — N250 Renal osteodystrophy: Secondary | ICD-10-CM | POA: Diagnosis not present

## 2021-06-23 DIAGNOSIS — I5032 Chronic diastolic (congestive) heart failure: Secondary | ICD-10-CM | POA: Diagnosis not present

## 2021-06-23 DIAGNOSIS — Z79899 Other long term (current) drug therapy: Secondary | ICD-10-CM | POA: Diagnosis not present

## 2021-06-23 DIAGNOSIS — J449 Chronic obstructive pulmonary disease, unspecified: Secondary | ICD-10-CM | POA: Insufficient documentation

## 2021-06-23 DIAGNOSIS — N186 End stage renal disease: Secondary | ICD-10-CM | POA: Insufficient documentation

## 2021-06-23 DIAGNOSIS — I120 Hypertensive chronic kidney disease with stage 5 chronic kidney disease or end stage renal disease: Secondary | ICD-10-CM | POA: Diagnosis not present

## 2021-06-23 DIAGNOSIS — R059 Cough, unspecified: Secondary | ICD-10-CM | POA: Diagnosis not present

## 2021-06-23 DIAGNOSIS — I132 Hypertensive heart and chronic kidney disease with heart failure and with stage 5 chronic kidney disease, or end stage renal disease: Secondary | ICD-10-CM | POA: Diagnosis not present

## 2021-06-23 DIAGNOSIS — I517 Cardiomegaly: Secondary | ICD-10-CM | POA: Diagnosis not present

## 2021-06-23 DIAGNOSIS — Z20822 Contact with and (suspected) exposure to covid-19: Secondary | ICD-10-CM | POA: Diagnosis not present

## 2021-06-23 DIAGNOSIS — Z992 Dependence on renal dialysis: Secondary | ICD-10-CM | POA: Diagnosis not present

## 2021-06-23 DIAGNOSIS — D631 Anemia in chronic kidney disease: Secondary | ICD-10-CM | POA: Diagnosis not present

## 2021-06-23 DIAGNOSIS — Z21 Asymptomatic human immunodeficiency virus [HIV] infection status: Secondary | ICD-10-CM | POA: Insufficient documentation

## 2021-06-23 DIAGNOSIS — Z794 Long term (current) use of insulin: Secondary | ICD-10-CM | POA: Diagnosis not present

## 2021-06-23 DIAGNOSIS — Z743 Need for continuous supervision: Secondary | ICD-10-CM | POA: Diagnosis not present

## 2021-06-23 DIAGNOSIS — E1122 Type 2 diabetes mellitus with diabetic chronic kidney disease: Secondary | ICD-10-CM | POA: Insufficient documentation

## 2021-06-23 DIAGNOSIS — R0602 Shortness of breath: Secondary | ICD-10-CM | POA: Insufficient documentation

## 2021-06-23 DIAGNOSIS — F17210 Nicotine dependence, cigarettes, uncomplicated: Secondary | ICD-10-CM | POA: Insufficient documentation

## 2021-06-23 DIAGNOSIS — E875 Hyperkalemia: Secondary | ICD-10-CM | POA: Diagnosis not present

## 2021-06-23 DIAGNOSIS — I129 Hypertensive chronic kidney disease with stage 1 through stage 4 chronic kidney disease, or unspecified chronic kidney disease: Secondary | ICD-10-CM | POA: Diagnosis not present

## 2021-06-23 LAB — CBC WITH DIFFERENTIAL/PLATELET
Abs Immature Granulocytes: 0.05 10*3/uL (ref 0.00–0.07)
Basophils Absolute: 0 10*3/uL (ref 0.0–0.1)
Basophils Relative: 1 %
Eosinophils Absolute: 0.2 10*3/uL (ref 0.0–0.5)
Eosinophils Relative: 2 %
HCT: 25.7 % — ABNORMAL LOW (ref 39.0–52.0)
Hemoglobin: 8.2 g/dL — ABNORMAL LOW (ref 13.0–17.0)
Immature Granulocytes: 1 %
Lymphocytes Relative: 13 %
Lymphs Abs: 1.1 10*3/uL (ref 0.7–4.0)
MCH: 31.1 pg (ref 26.0–34.0)
MCHC: 31.9 g/dL (ref 30.0–36.0)
MCV: 97.3 fL (ref 80.0–100.0)
Monocytes Absolute: 0.9 10*3/uL (ref 0.1–1.0)
Monocytes Relative: 11 %
Neutro Abs: 6 10*3/uL (ref 1.7–7.7)
Neutrophils Relative %: 72 %
Platelets: 228 10*3/uL (ref 150–400)
RBC: 2.64 MIL/uL — ABNORMAL LOW (ref 4.22–5.81)
RDW: 17.9 % — ABNORMAL HIGH (ref 11.5–15.5)
WBC: 8.2 10*3/uL (ref 4.0–10.5)
nRBC: 0 % (ref 0.0–0.2)

## 2021-06-23 LAB — COMPREHENSIVE METABOLIC PANEL
ALT: 18 U/L (ref 0–44)
AST: 22 U/L (ref 15–41)
Albumin: 2.6 g/dL — ABNORMAL LOW (ref 3.5–5.0)
Alkaline Phosphatase: 60 U/L (ref 38–126)
Anion gap: 17 — ABNORMAL HIGH (ref 5–15)
BUN: 123 mg/dL — ABNORMAL HIGH (ref 8–23)
CO2: 22 mmol/L (ref 22–32)
Calcium: 9 mg/dL (ref 8.9–10.3)
Chloride: 96 mmol/L — ABNORMAL LOW (ref 98–111)
Creatinine, Ser: 10.85 mg/dL — ABNORMAL HIGH (ref 0.61–1.24)
GFR, Estimated: 5 mL/min — ABNORMAL LOW (ref >60)
Glucose, Bld: 158 mg/dL — ABNORMAL HIGH (ref 70–99)
Potassium: 6.3 mmol/L (ref 3.5–5.1)
Sodium: 135 mmol/L (ref 135–145)
Total Bilirubin: 0.4 mg/dL (ref 0.3–1.2)
Total Protein: 7.7 g/dL (ref 6.5–8.1)

## 2021-06-23 LAB — RESP PANEL BY RT-PCR (FLU A&B, COVID) ARPGX2
Influenza A by PCR: NEGATIVE
Influenza B by PCR: NEGATIVE
SARS Coronavirus 2 by RT PCR: NEGATIVE

## 2021-06-23 MED ORDER — SODIUM ZIRCONIUM CYCLOSILICATE 10 G PO PACK
10.0000 g | PACK | Freq: Once | ORAL | Status: AC
  Administered 2021-06-23: 10 g via ORAL
  Filled 2021-06-23: qty 1

## 2021-06-23 MED ORDER — CHLORHEXIDINE GLUCONATE CLOTH 2 % EX PADS: 6.0000 | MEDICATED_PAD | Freq: Every day | CUTANEOUS | Status: DC

## 2021-06-23 NOTE — ED Notes (Signed)
Returned to patient room who now states he will stay as he does not have a ride home.

## 2021-06-23 NOTE — ED Notes (Signed)
Patient has relinquished his lighter which will be with security and will be staying. He was informed that if he was caught smoking again he would be discharged from the facility. Patient verbalized understanding.

## 2021-06-23 NOTE — ED Notes (Signed)
Call placed to hemodialysis and they stated he would be dialyzed at 6am. Upon discussion with patient he does not want to stay and does not want to wait until 6am. Discussed risk of leaving AMA to include death. His normal dialysis days are MWF. Discussed he would need dialysis tomorrow outpatient if he were to leave. States he does not know he could go tomorrow. Patient informed me that he has already called his ride. He is A&O and of sound mind able to make decisions for himself and verbalized understanding of risk. Dr. Karle Starch notified.

## 2021-06-23 NOTE — ED Triage Notes (Signed)
Pt from home with ems requesting dialysis. Last treatment was last Friday. Pt missed Monday due to not feeling well, pt went this morning and left due to having personal issues with the staff. Pt denies chest pain but does have some SOB, states he was recently diagnosed with pneumonia and is on 2 different abx for it. Resp e.u at this time.

## 2021-06-23 NOTE — ED Provider Notes (Signed)
[facility] EMERGENCY DEPARTMENT Provider Note  CSN: [id number] Arrival date & time: 06/23/21 1429    History No chief complaint on file.   Jacob [name] is a 70 y.o. male with history of ESRD on HD MWF reports he has not been feeling well for about a week. He went to his PCP office on 8/23 for cough and runny nose, had a neg Covid test then. He was still feeling poorly so he did not go to Dialysis on Monday. He was seen at PCP office yesterday again and had a CXR showing multifocal PNA, given Rx for Zithromax and Cefdinir. He went to dialysis this morning but the staff there wanted to make him wear an N95 mask and sit with the Covid patients and he refused. He was advised to come to the ED instead. He reports continued cough and SOB. Denies fever. No leg swelling.    Past Medical History:  Diagnosis Date   Acute respiratory failure ([place]) 03/01/2018   Anemia    Arthritis    "all over; mostly knees and back" (02/28/2018)   Chronic combined systolic and diastolic CHF (congestive heart failure) (HCC)    Chronic lower back pain    stenosis   Community acquired pneumonia 09/06/2013   COPD (chronic obstructive pulmonary disease) ([place])    Coronary atherosclerosis of native coronary artery    a. 02/2003 s/p CABG x 2 (VG->RI, VG->RPDA; b. 11/2019 PCI: LM nl, LAD 90d, D3 50, RI 100, LCX 100p, OM3 100 - fills via L->L collats from D2/dLAD, RCA 100p, VG->RPDA ok, VG->RI 95 (3.5x48 Synergy XD DES).   Drug abuse ([place])    hx; tested for cocaine as recently as 2/08. says he is not using drugs now - avoided defib. for this reason    ESRD (end stage renal disease) ([place])    Hemo M-W-F- [facility]   Fall at home 10/2020   GERD (gastroesophageal reflux disease)    takes OTC meds as needed   GI bleeding    a. 11/2019 EGD: angiodysplastic lesions w/ bleeding s/p argon plasma/clipping/epi inj. Multiple admissions for the same.   Glaucoma    uses eye drops daily   Hepatitis B 1968   "tx'd w/isolation;  caught it from toilet stools in gym"   History of blood transfusion 03/01/2019   History of colon polyps    benign   History of gout    takes Allopurinol daily as well as Colchicine-if needed (02/28/2018)   History of kidney stones    HTN (hypertension)    takes Coreg,Imdur.and Apresoline daily   Human immunodeficiency virus (HIV) disease ([place]) dx'd 1995   on [place] as of 12/2020.     Hyperlipidemia    Ischemic cardiomyopathy    a. 01/2019 Echo: EF 40-45%, diffuse HK, mild basal septal hypertrophy. Diast dysfxn. Nl RV size/fxn. Sev dil LA. Triv MR/TR/PR.   Muscle spasm    takes Zanaflex as needed   Myocardial infarction ([place]) ~ 2004/2005   Nocturia    Peripheral neuropathy    takes gabapentin daily   Pneumonia    "at least twice" (02/28/2018)   SDH (subdural hematoma) (HCC)    Syphilis, unspecified    Type II diabetes mellitus ([place]) 2004   Lantus daily.Average fasting blood sugar 125-199   Wears glasses    Wears partial dentures     Past Surgical History:  Procedure Laterality Date   AV FISTULA PLACEMENT Left 08/02/2018   Procedure: ARTERIOVENOUS (AV) FISTULA CREATION  left arm radiocephlic;  Surgeon: Marty Heck, MD;  Location: [place];  Service: Vascular;  Laterality: Left;   AV FISTULA PLACEMENT Left 08/01/2019   Procedure: LEFT BRACHIOCEPHALIC ARTERIOVENOUS (AV) FISTULA CREATION;  Surgeon: [name], MD;  Location: [place];  Service: Vascular;  Laterality: Left;   [place] Left 10/03/2019   Procedure: BASILIC VEIN TRANSPOSITION LEFT SECOND STAGE;  Surgeon: [name], MD;  Location: [place];  Service: Vascular;  Laterality: Left;   BIOPSY  01/25/2021   Procedure: BIOPSY;  Surgeon: Doran Stabler, MD;  Location: [facility] ENDOSCOPY;  Service: Gastroenterology;;   CARDIAC CATHETERIZATION  10/2002; 12/19/2004   [name] 03/08/2011   COLONOSCOPY  2013   [place]    CORONARY ARTERY BYPASS GRAFT  02/24/2003   CABG X2/notes 03/08/2011   CORONARY STENT  INTERVENTION N/A 12/19/2019   Procedure: CORONARY STENT INTERVENTION;  Surgeon: Jettie Booze, MD;  Location: [place] CV LAB;  Service: Cardiovascular;  Laterality: N/A;   ENTEROSCOPY N/A 01/25/2021   Procedure: ENTEROSCOPY;  Surgeon: Doran Stabler, MD;  Location: [place];  Service: Gastroenterology;  Laterality: N/A;   ENTEROSCOPY N/A 02/13/2021   Procedure: ENTEROSCOPY;  Surgeon: Jackquline Denmark, MD;  Location: [facility] ENDOSCOPY;  Service: Endoscopy;  Laterality: N/A;   ENTEROSCOPY N/A 05/07/2021   Procedure: ENTEROSCOPY;  Surgeon: Yetta Flock, MD;  Location: [facility] ENDOSCOPY;  Service: Gastroenterology;  Laterality: N/A;   ESOPHAGOGASTRODUODENOSCOPY (EGD) WITH PROPOFOL N/A 02/08/2019   Procedure: ESOPHAGOGASTRODUODENOSCOPY (EGD) WITH PROPOFOL;  Surgeon: Milus Banister, MD;  Location: [place];  Service: Gastroenterology;  Laterality: N/A;   ESOPHAGOGASTRODUODENOSCOPY (EGD) WITH PROPOFOL N/A 12/22/2019   Procedure: ESOPHAGOGASTRODUODENOSCOPY (EGD) WITH PROPOFOL;  Surgeon: Lavena Bullion, DO;  Location: [place];  Service: Gastroenterology;  Laterality: N/A;   ESOPHAGOGASTRODUODENOSCOPY (EGD) WITH PROPOFOL N/A 10/19/2020   Procedure: ESOPHAGOGASTRODUODENOSCOPY (EGD) WITH PROPOFOL;  Surgeon: Jackquline Denmark, MD;  Location: [facility] ENDOSCOPY;  Service: Endoscopy;  Laterality: N/A;   ESOPHAGOGASTRODUODENOSCOPY (EGD) WITH PROPOFOL N/A 12/22/2020   Procedure: ESOPHAGOGASTRODUODENOSCOPY (EGD) WITH PROPOFOL;  Surgeon: Gatha Mayer, MD;  Location: [place];  Service: Endoscopy;  Laterality: N/A;   ESOPHAGOGASTRODUODENOSCOPY (EGD) WITH PROPOFOL N/A 01/09/2021   Procedure: ESOPHAGOGASTRODUODENOSCOPY (EGD) WITH PROPOFOL;  Surgeon: Irene Shipper, MD;  Location: [facility] ENDOSCOPY;  Service: Endoscopy;  Laterality: N/A;   HEMOSTASIS CLIP PLACEMENT  12/22/2019   Procedure: HEMOSTASIS CLIP PLACEMENT;  Surgeon: Lavena Bullion, DO;  Location: [place] ENDOSCOPY;  Service: Gastroenterology;;    HEMOSTASIS CLIP PLACEMENT  12/22/2020   Procedure: HEMOSTASIS CLIP PLACEMENT;  Surgeon: Gatha Mayer, MD;  Location: [facility] ENDOSCOPY;  Service: Endoscopy;;   HEMOSTASIS CONTROL  12/22/2020   Procedure: HEMOSTASIS CONTROL/hemospray;  Surgeon: Gatha Mayer, MD;  Location: [place];  Service: Endoscopy;;   HOT HEMOSTASIS N/A 02/08/2019   Procedure: HOT HEMOSTASIS (ARGON PLASMA COAGULATION/BICAP);  Surgeon: Milus Banister, MD;  Location: [facility] ENDOSCOPY;  Service: Gastroenterology;  Laterality: N/A;   HOT HEMOSTASIS N/A 12/22/2019   Procedure: HOT HEMOSTASIS (ARGON PLASMA COAGULATION/BICAP);  Surgeon: Lavena Bullion, DO;  Location: [facility] ENDOSCOPY;  Service: Gastroenterology;  Laterality: N/A;   HOT HEMOSTASIS N/A 10/19/2020   Procedure: HOT HEMOSTASIS (ARGON PLASMA COAGULATION/BICAP);  Surgeon: Jackquline Denmark, MD;  Location: [facility] ENDOSCOPY;  Service: Endoscopy;  Laterality: N/A;   HOT HEMOSTASIS N/A 12/22/2020   Procedure: HOT HEMOSTASIS (ARGON PLASMA COAGULATION/BICAP);  Surgeon: Gatha Mayer, MD;  Location: [facility] ENDOSCOPY;  Service: Endoscopy;  Laterality: N/A;   HOT  HEMOSTASIS N/A 01/09/2021   Procedure: HOT HEMOSTASIS (ARGON PLASMA COAGULATION/BICAP);  Surgeon: Irene Shipper, MD;  Location: [facility] ENDOSCOPY;  Service: Endoscopy;  Laterality: N/A;   HOT HEMOSTASIS N/A 01/25/2021   Procedure: HOT HEMOSTASIS (ARGON PLASMA COAGULATION/BICAP);  Surgeon: Doran Stabler, MD;  Location: [place];  Service: Gastroenterology;  Laterality: N/A;   HOT HEMOSTASIS N/A 02/13/2021   Procedure: HOT HEMOSTASIS (ARGON PLASMA COAGULATION/BICAP);  Surgeon: Jackquline Denmark, MD;  Location[facility] ENDOSCOPY;  Service: Endoscopy;  Laterality: N/A;   HOT HEMOSTASIS N/A 05/07/2021   Procedure: HOT HEMOSTASIS (ARGON PLASMA COAGULATION/BICAP);  Surgeon: Yetta Flock, MD;  Location: [facility] ENDOSCOPY;  Service: Gastroenterology;  Laterality: N/A;   INTERTROCHANTERIC HIP FRACTURE SURGERY Left 11/2006   [name] 03/08/2011    INTRAVASCULAR ULTRASOUND/IVUS N/A 12/19/2019   Procedure: Intravascular Ultrasound/IVUS;  Surgeon: Jettie Booze, MD;  Location: [place] CV LAB;  Service: Cardiovascular;  Laterality: N/A;   IR FLUORO GUIDE CV LINE RIGHT  07/24/2019   IR FLUORO GUIDE CV LINE RIGHT  07/30/2019   IR US GUIDE VASC ACCESS RIGHT  07/24/2019   IR US GUIDE VASC ACCESS RIGHT  07/30/2019   LAPAROSCOPIC CHOLECYSTECTOMY  05/2006   LIGATION OF COMPETING BRANCHES OF ARTERIOVENOUS FISTULA Left 11/05/2018   Procedure: LIGATION OF COMPETING BRANCHES OF ARTERIOVENOUS FISTULA  LEFT  ARM;  Surgeon: Marty Heck, MD;  Location: [facility] OR;  Service: Vascular;  Laterality: Left;   LUMBAR LAMINECTOMY/DECOMPRESSION MICRODISCECTOMY N/A 02/29/2016   Procedure: Left L4-5 Lateral Recess Decompression, Removal Extradural Intraspinal Facet Cyst;  Surgeon: Marybelle Killings, MD;  Location: [place];  Service: Orthopedics;  Laterality: N/A;   MULTIPLE TOOTH EXTRACTIONS     ORIF MANDIBULAR FRACTURE Left 08/13/2004   ORIF of left body fracture mandible with KLS Martin 2.3-mm six hole/notes 03/08/2011   RIGHT/LEFT HEART CATH AND CORONARY/GRAFT ANGIOGRAPHY N/A 12/19/2019   Procedure: RIGHT/LEFT HEART CATH AND CORONARY/GRAFT ANGIOGRAPHY;  Surgeon: Jettie Booze, MD;  Location: [place] CV LAB;  Service: Cardiovascular;  Laterality: N/A;   SCLEROTHERAPY  12/22/2019   Procedure: SCLEROTHERAPY;  Surgeon: Lavena Bullion, DO;  Location: [facility] ENDOSCOPY;  Service: Gastroenterology;;   Clide Deutscher  02/13/2021   Procedure: Clide Deutscher;  Surgeon: Jackquline Denmark, MD;  Location: [facility] ENDOSCOPY;  Service: Endoscopy;;    Family History  Problem Relation Age of Onset   Heart failure Father    Hypertension Father    Diabetes Brother    Heart attack Brother    Alzheimer's disease Mother    Stroke Sister    Diabetes Sister    Alzheimer's disease Sister    Hypertension Brother    Diabetes Brother    Drug abuse Brother    Colon cancer Neg Hx      Social History   Tobacco Use   Smoking status: Every Day    Packs/day: 0.50    Years: 43.00    Pack years: 21.50    Types: Cigarettes   Smokeless tobacco: Never  Vaping Use   Vaping Use: Never used  Substance Use Topics   Alcohol use: Not Currently    Alcohol/week: 12.0 standard drinks    Types: 12 Standard drinks or equivalent per week    Comment: occassional   Drug use: Not Currently    Types: Cocaine    Comment: hx of crack/cocaine 62yrs ago 10/01/2019- none     Home Medications Prior to Admission medications   Medication Sig Start Date End Date Taking? Authorizing Provider  acetaminophen (TYLENOL) 650  MG CR tablet Take 1,300 mg by mouth 3 (three) times daily.    [provider]  allopurinol (ZYLOPRIM) 100 MG tablet TAKE 1 TABLET(100 MG) BY MOUTH DAILY 01/12/21   [name], Dinah C, NP  ANORO ELLIPTA 62.5-25 MCG/INH AEPB INHALE 1 PUFF BY MOUTH EVERY DAY 12/21/20   [name], NP  atorvastatin (LIPITOR) 10 MG tablet Take 1 tablet (10 mg total) by mouth daily. 03/02/21 06/22/21  Charlie Pitter, PA-C  azithromycin (ZITHROMAX) 250 MG tablet Take 2 tablets on day 1, then 1 tablet daily on days 2 through 5 06/22/21 06/27/21  [name], NP  B-D UF III MINI PEN NEEDLES 31G X 5 MM MISC USE FOUR TIMES DAILY 05/20/21   [name], NP  bictegravir-emtricitabine-tenofovir AF (BIKTARVY) 50-200-25 MG TABS tablet Take 1 tablet by mouth daily. 06/30/20   [name], MD  Calcium Acetate 667 MG TABS Take [phone] mg by mouth See admin instructions. Take 1,334 mg by mouth three times a day with meals and 667 mg with each snack 01/20/21   [provider]  carvedilol (COREG) 3.125 MG tablet TAKE 1 TABLET(3.125 MG) BY MOUTH TWICE DAILY 04/14/21   [name], MD  cefdinir (OMNICEF) 300 MG capsule 1 tablet by mouth every other day after dialysis 06/22/21   [name], NP  colchicine 0.6 MG tablet TAKE 1 TABLET BY MOUTH AS NEEDED FOR GOUT 04/14/21    [name], Dinah C, NP  diclofenac Sodium (VOLTAREN) 1 % GEL APPLY 2 GRAMS TOPICALLY TO THE AFFECTED AREA THREE TIMES DAILY AS NEEDED FOR PAIN 04/14/21   [name], NP  folic acid (FOLVITE) 1 MG tablet Take 1 tablet (1 mg total) by mouth daily. 01/12/21   [name], Dinah C, NP  gabapentin (NEURONTIN) 300 MG capsule Take 1 capsule (300 mg total) by mouth daily. Dose change due to renal function 12/09/20   [name], NP  glucose blood (ONETOUCH ULTRA) test strip Check blood sugar three times daily. Dx:E11.40 03/02/21   [name], NP  insulin lispro (HUMALOG) 100 UNIT/ML KwikPen INJECT 13 UNITS UNDER THE SKIN THREE TIMES DAILY AS NEEDED FOR HIGH BLOOD SUGAR(ABOVE 150) 05/08/21   Regalado, Belkys A, MD  isosorbide mononitrate (IMDUR) 30 MG 24 hr tablet TAKE 1 TABLET BY MOUTH EVERY DAY 01/12/21   [name], Dinah C, NP  Lancets (ONETOUCH DELICA PLUS [id number]) MISC Inject 1 Device as directed 3 (three) times daily. Dx: E11.40 09/18/19   [name], NP  latanoprost (XALATAN) 0.005 % ophthalmic solution Place 1 drop into both eyes at bedtime.    [provider]  lidocaine-prilocaine (EMLA) cream Apply 1 application topically every Monday, Wednesday, and Friday with hemodialysis.    [provider]  Methoxy PEG-Epoetin Beta (MIRCERA IJ) Mircera 05/19/21 05/18/22  [provider]  multivitamin (RENA-VIT) TABS tablet Take 1 tablet by mouth daily.    [provider]  nitroGLYCERIN (NITROSTAT) 0.3 MG SL tablet PLACE 1 TABLET UNDER THE TONGUE AS NEEDED FOR CHEST PAIN 04/14/21   [name], MD  ondansetron (ZOFRAN) 4 MG tablet Take 1 tablet (4 mg total) by mouth every 8 (eight) hours as needed for nausea or vomiting. 06/15/21   [name], Dinah C, NP  oxyCODONE (OXY IR/ROXICODONE) 5 MG immediate release tablet Take 1 tablet (5 mg total) by mouth every 4 (four) hours as needed for severe pain. Patient not taking: Reported on 06/22/2021 06/03/21   Jessy Oto, MD  oxyCODONE-acetaminophen (PERCOCET/ROXICET) 5-325 MG tablet Take 1 tablet by mouth daily as needed for severe pain. Patient not taking: Reported on 06/22/2021    [provider]  pantoprazole (PROTONIX) 40 MG tablet Take 1 tablet (40 mg total) by mouth 2 (two) times daily before a meal. 05/08/21   Regalado, Belkys A, MD  polyethylene glycol (MIRALAX / GLYCOLAX) 17 g packet Take 17 g by mouth daily as needed for mild constipation. 11/07/20   Aline August, MD  timolol (TIMOPTIC) 0.5 % ophthalmic solution Place 1 drop into both eyes in the morning. 04/04/19   [provider]  VITAMIN D PO Take by mouth. 05/19/21 05/18/22  [provider]     Allergies    Augmentin [amoxicillin-pot clavulanate], Mucinex fast-max, and Amphetamines   Review of Systems   Review of Systems A comprehensive review of systems was completed and negative except as noted in HPI.    Physical Exam BP (!) 155/90 (BP Location: Right Arm)   Pulse 91   Temp 98.3 F (36.8 C) (Oral)   Resp 18   SpO2 90%   Physical Exam Vitals and nursing note reviewed.  Constitutional:      Appearance: Normal appearance.  HENT:     Head: Normocephalic and atraumatic.     Nose: Nose normal.     Mouth/Throat:     Mouth: Mucous membranes are moist.  Eyes:     Extraocular Movements: Extraocular movements intact.     Conjunctiva/sclera: Conjunctivae normal.  Cardiovascular:     Rate and Rhythm: Normal rate.  Pulmonary:     Effort: Pulmonary effort is normal.     Breath sounds: Rhonchi and rales present.  Abdominal:     General: Abdomen is flat.     Palpations: Abdomen is soft.     Tenderness: There is no abdominal tenderness.  Musculoskeletal:        General: No swelling. Normal range of motion.     Cervical back: Neck supple.     Right lower leg: No edema.     Left lower leg: No edema.     Comments: Fistula in LUE with palpable thrill  Skin:    General: Skin is warm and dry.  Neurological:      General: No focal deficit present.     Mental Status: He is alert.  Psychiatric:        Mood and Affect: Mood normal.     ED Results / Procedures / Treatments   Labs (all labs ordered are listed, but only abnormal results are displayed) Labs Reviewed  CBC WITH DIFFERENTIAL/PLATELET - Abnormal; Notable for the following components:      Result Value   RBC 2.64 (*)    Hemoglobin 8.2 (*)    HCT 25.7 (*)    RDW 17.9 (*)    All other components within normal limits  COMPREHENSIVE METABOLIC PANEL - Abnormal; Notable for the following components:   Potassium 6.3 (*)    Chloride 96 (*)    Glucose, Bld 158 (*)    BUN 123 (*)    Creatinine, Ser 10.85 (*)    Albumin 2.6 (*)    GFR, Estimated 5 (*)    Anion gap 17 (*)    All other components within normal limits  RESP PANEL BY RT-PCR (FLU A&B, COVID) [id number]    EKG EKG Interpretation  Date/Time:  Wednesday June 23 2021 14:35:57 EDT Ventricular Rate:  90 PR Interval:  170 QRS Duration: 140 QT Interval:  434 QTC Calculation: 530 R Axis:   -26 Text Interpretation: Normal sinus rhythm Right bundle branch block Inferior infarct , age undetermined T wave abnormality, consider lateral ischemia Abnormal ECG No significant change since last tracing Confirmed by Calvert Cantor [id number]) on 06/23/2021 4:03:39 PM  Radiology DG Chest 2 View  Result Date: 06/22/2021 CLINICAL DATA:  cough and shortness of breath EXAM: CHEST - 2 VIEW COMPARISON:  05/03/2021. FINDINGS: Patchy airspace opacities in bilateral mid and upper lungs, including some somewhat nodular opacities. No visible pleural effusions pneumothorax. Similar mild enlargement cardiac silhouette. Median sternotomy and CABG. Atherosclerosis of the aorta. Multiple remote right rib fractures. IMPRESSION: Patchy airspace opacities in bilateral mid and upper lungs, suspicious for multifocal pneumonia. Given that some of these opacities are somewhat nodular in appearance, followup PA and  lateral chest X-ray is recommended in 3-4 weeks following trial of antibiotic therapy to ensure resolution and exclude underlying malignancy. Electronically Signed   By: Margaretha Sheffield M.D.   On: 06/22/2021 11:42    Procedures Procedures  Medications Ordered in the ED Medications - No data to display   MDM Rules/Calculators/A&P MDM CXR from yesterday reviewed, will recheck today for comparison. CBC and CMP from triage reviewed, no leukocytosis, anemia is chronic. There is moderate hyperkalemia, no EKG changes. Will give a dose of Lokelma, Covid screen and discuss with Nephrology  ED Course  I have reviewed the triage vital signs and the nursing notes.  Pertinent labs & imaging results that were available during my care of the patient were reviewed by me and considered in my medical decision making (see chart for details).  Clinical Course as of 06/24/21 0834  Wed Jun 23, 2021  1738 Spoke with Dr. Augustin Coupe, Nephrology, who will arrange for patient to have dialysis. CXR today is more consistent with pulm edema that infiltrates.  [CS]  8676 Covid is neg [CS]  7209 Covid is neg.  [CS]  2256 Patient caught smoking in his room, informed of hospital policy regarding smoking. Refuses to give up his cigarettes to security. Advised that he would be escorted off the property if he did not comply with hospital policy.  [CS]    Clinical Course User Index [CS] Truddie Hidden, MD    Final Clinical Impression(s) / ED Diagnoses Final diagnoses:  ESRD on hemodialysis [facility])  Hyperkalemia    Rx / DC Orders ED Discharge Orders     None        Truddie Hidden, MD 06/24/21 [phone]

## 2021-06-23 NOTE — Telephone Encounter (Signed)
Patient called and stated that he went for his dialysis this morning at 5:30am by they wanted him to wear a special mask and go in the back with the Covid patient's.  Patient chose not to. The dialysis center told him that [name] can call [facility] and admit him to Dialysis there.   Patient stated that he has not taken any of his medications until he hears back from you.   Patient would like for you to call him at [phone].

## 2021-06-23 NOTE — Consult Note (Signed)
Reason for Consult:Renal failure Referring Physician: Dr. Calvert Cantor  Chief Complaint: Shortness of breath   Home meds:  - allopurinol 100/ colchicine prn  - sensipar tiw/ phoslo 2 ac tid  - protonix 40 bid  - imdur 30 qd/ sl ntg prn/ asa 81  - anoro ellipta 1 puff qd  - biktarvy 1 qd  - coreg 3.125 bid  - neurontin 300 qd  - insulin lispro 20u tid ac prn  - prn's/ vitamins/ supplements   OP HD: [facility] MWF  4h  400/500  77 kg  2/2 bath  AVF  15g  Hep none  - mircera 150 ug q2weeks last given 7/27  - calc 1 ut tiw po Access: Lt BBT  Assessment/Plan: ESRD - on HD MWF. Last HD was Fri; many emergency cases tonight and patient will be dialyzed 1st thing in the AM. Also still need the COVID test to result.  We have been able to reach his presumed EDW of 77kg; left last Friday at 77kg.  Hyperkalemia -2K dialysate, renal diet; Lokelma x1 + protocol.  Anemia ckd and GIB -admit Hgb 8.2.  Next Aranesp 150 due but unlikely to respond with acute infection and inflammatory state. Transfuse as needed. MBD ckd - cont vdra, binders  and will check phos for binder management HIV - biktarvy COPD Chron systolic CHF= no shortness of breath hemodynamically stable HTN / vol - restart home medications;  UF tomorrow will help as well.  DM2 - per pmd    HPI: Jacob [name] is an 70 y.o. male HIV DM HTN ESRD MWF at [facility] who has not been feeling well for about a week but was negative for COVID at [place] office 8/23 after p/w cough and rhinorrhea. He missed dialysis on Monday because he was still not feeling well; when he went to see the PMD again Tuesday a CXR revealed possible mutlifocal PNA and he was given a rx for Zithromax and Cefdinir. He had only taken one dose so far and when he went to dialysis this AM because of staffing issues he would have had to sit in the COVID section + wear a N95 mask. He refused and was therefore not dialyzed. He is presenting with shortness of breath and found in the ED to  be hyperkalemic as well with no EKG changes. He denies fever, chills, myalgias but has a nonproductive cough; he also denies abdominal pain, CP, diarrhea or dysuria. He still makes a small amount of urine.  ROS Pertinent items are noted in HPI.  Chemistry and CBC:   Recent Labs  Lab 06/23/21 1452  NA 135  K 6.3*  CL 96*  CO2 22  GLUCOSE 158*  BUN 123*  CREATININE 10.85*  CALCIUM 9.0   Recent Labs  Lab 06/23/21 1452  WBC 8.2  NEUTROABS 6.0  HGB 8.2*  HCT 25.7*  MCV 97.3  PLT 228   Liver Function Tests: Recent Labs  Lab 06/23/21 1452  AST 22  ALT 18  ALKPHOS 60  BILITOT 0.4  PROT 7.7  ALBUMIN 2.6*   No results for input(s): LIPASE, AMYLASE in the last 168 hours. No results for input(s): AMMONIA in the last 168 hours. Cardiac Enzymes: No results for input(s): CKTOTAL, CKMB, CKMBINDEX, TROPONINI in the last 168 hours. Iron Studies: No results for input(s): IRON, TIBC, TRANSFERRIN, FERRITIN in the last 72 hours. PT/INR: @LABRCNTIP (inr:5)  Xrays/Other Studies: ) Results for orders placed or performed during the hospital encounter of 06/23/21 (from the  past 48 hour(s))  CBC with Differential     Status: Abnormal   Collection Time: 06/23/21  2:52 PM  Result Value Ref Range   WBC 8.2 4.0 - 10.5 K/uL   RBC 2.64 (L) 4.22 - 5.81 MIL/uL   Hemoglobin 8.2 (L) 13.0 - 17.0 g/dL   HCT 25.7 (L) 39.0 - 52.0 %   MCV 97.3 80.0 - 100.0 fL   MCH 31.1 26.0 - 34.0 pg   MCHC 31.9 30.0 - 36.0 g/dL   RDW 17.9 (H) 11.5 - 15.5 %   Platelets 228 150 - 400 K/uL   nRBC 0.0 0.0 - 0.2 %   Neutrophils Relative % 72 %   Neutro Abs 6.0 1.7 - 7.7 K/uL   Lymphocytes Relative 13 %   Lymphs Abs 1.1 0.7 - 4.0 K/uL   Monocytes Relative 11 %   Monocytes Absolute 0.9 0.1 - 1.0 K/uL   Eosinophils Relative 2 %   Eosinophils Absolute 0.2 0.0 - 0.5 K/uL   Basophils Relative 1 %   Basophils Absolute 0.0 0.0 - 0.1 K/uL   Immature Granulocytes 1 %   Abs Immature Granulocytes 0.05 0.00 - 0.07  K/uL    Comment: Performed at [facility], [address] [place], [place] [address]  Comprehensive metabolic panel     Status: Abnormal   Collection Time: 06/23/21  2:52 PM  Result Value Ref Range   Sodium 135 135 - 145 mmol/L   Potassium 6.3 (HH) 3.5 - 5.1 mmol/L    Comment: CRITICAL RESULT CALLED TO, READ BACK BY AND VERIFIED WITH: M.EATON,RN @1600  06/23/2021 VANG.J    Chloride 96 (L) 98 - 111 mmol/L   CO2 22 22 - 32 mmol/L   Glucose, Bld 158 (H) 70 - 99 mg/dL    Comment: Glucose reference range applies only to samples taken after fasting for at least 8 hours.   BUN 123 (H) 8 - 23 mg/dL   Creatinine, Ser 10.85 (H) 0.61 - 1.24 mg/dL   Calcium 9.0 8.9 - 10.3 mg/dL   Total Protein 7.7 6.5 - 8.1 g/dL   Albumin 2.6 (L) 3.5 - 5.0 g/dL   AST 22 15 - 41 U/L   ALT 18 0 - 44 U/L   Alkaline Phosphatase 60 38 - 126 U/L   Total Bilirubin 0.4 0.3 - 1.2 mg/dL   GFR, Estimated 5 (L) >60 mL/min    Comment: (NOTE) Calculated using the CKD-EPI Creatinine Equation (2021)    Anion gap 17 (H) 5 - 15    Comment: Performed at [facility], [place] [address] [place], [place] [address]   *Note: Due to a large number of results and/or encounters for the requested time period, some results have not been displayed. A complete set of results can be found in Results Review.   DG Chest 2 View  Result Date: 06/22/2021 CLINICAL DATA:  cough and shortness of breath EXAM: CHEST - 2 VIEW COMPARISON:  05/03/2021. FINDINGS: Patchy airspace opacities in bilateral mid and upper lungs, including some somewhat nodular opacities. No visible pleural effusions pneumothorax. Similar mild enlargement cardiac silhouette. Median sternotomy and CABG. Atherosclerosis of the aorta. Multiple remote right rib fractures. IMPRESSION: Patchy airspace opacities in bilateral mid and upper lungs, suspicious for multifocal pneumonia. Given that some of these opacities are somewhat nodular in appearance, followup PA and lateral  chest X-ray is recommended in 3-4 weeks following trial of antibiotic therapy to ensure resolution and exclude underlying malignancy. Electronically Signed  By: Margaretha Sheffield M.D.   On: 06/22/2021 11:42   DG Chest Port 1 View  Result Date: 06/23/2021 CLINICAL DATA:  Shortness of breath, missed dialysis EXAM: PORTABLE CHEST 1 VIEW COMPARISON:  Chest radiograph 1 day prior FINDINGS: Median sternotomy wires and mediastinal surgical clips are stable. The heart is enlarged, unchanged. The mediastinum is unchanged. There is calcified atherosclerotic plaque of the aortic arch. There is vascular congestion with suspected mild pulmonary interstitial edema. Nodular opacities are again seen in the left upper lobe. There is no pleural effusion. There is no pneumothorax. Aeration is overall similar to the radiographs obtained 1 day prior. There is no acute osseous abnormality. IMPRESSION: 1. Suspect mild pulmonary interstitial edema. 2. Patchy nodular opacities in the apices are not significantly changed. As before, follow-up radiographs in 6-8 weeks is recommended to assess for resolution. 3. Unchanged cardiomegaly. 4. Overall, no significant interval change in lung aeration. Electronically Signed   By: Valetta Mole M.D.   On: 06/23/2021 16:59    PMH:   Past Medical History:  Diagnosis Date   Acute respiratory failure ([place]) 03/01/2018   Anemia    Arthritis    "all over; mostly knees and back" (02/28/2018)   Chronic combined systolic and diastolic CHF (congestive heart failure) (HCC)    Chronic lower back pain    stenosis   Community acquired pneumonia 09/06/2013   COPD (chronic obstructive pulmonary disease) ([place])    Coronary atherosclerosis of native coronary artery    a. 02/2003 s/p CABG x 2 (VG->RI, VG->RPDA; b. 11/2019 PCI: LM nl, LAD 90d, D3 50, RI 100, LCX 100p, OM3 100 - fills via L->L collats from D2/dLAD, RCA 100p, VG->RPDA ok, VG->RI 95 (3.5x48 Synergy XD DES).   Drug abuse ([place])    hx; tested for  cocaine as recently as 2/08. says he is not using drugs now - avoided defib. for this reason    ESRD (end stage renal disease) ([place])    [facility] M-W-F- [facility]   Fall at home 10/2020   GERD (gastroesophageal reflux disease)    takes OTC meds as needed   GI bleeding    a. 11/2019 EGD: angiodysplastic lesions w/ bleeding s/p argon plasma/clipping/epi inj. Multiple admissions for the same.   Glaucoma    uses eye drops daily   Hepatitis B 1968   "tx'd w/isolation; caught it from toilet stools in gym"   History of blood transfusion 03/01/2019   History of colon polyps    benign   History of gout    takes Allopurinol daily as well as Colchicine-if needed (02/28/2018)   History of kidney stones    HTN (hypertension)    takes Coreg,Imdur.and Apresoline daily   Human immunodeficiency virus (HIV) disease ([place]) dx'd 1995   on [place] as of 12/2020.     Hyperlipidemia    Ischemic cardiomyopathy    a. 01/2019 Echo: EF 40-45%, diffuse HK, mild basal septal hypertrophy. Diast dysfxn. Nl RV size/fxn. Sev dil LA. Triv MR/TR/PR.   Muscle spasm    takes Zanaflex as needed   Myocardial infarction ([employer]) ~ 2004/2005   Nocturia    Peripheral neuropathy    takes gabapentin daily   Pneumonia    "at least twice" (02/28/2018)   SDH (subdural hematoma) (HCC)    Syphilis, unspecified    Type II diabetes mellitus ([place]) 2004   Lantus daily.Average fasting blood sugar 125-199   Wears glasses    Wears partial dentures     PSH:  Past Surgical History:  Procedure Laterality Date   AV FISTULA PLACEMENT Left 08/02/2018   Procedure: ARTERIOVENOUS (AV) FISTULA CREATION  left arm radiocephlic;  Surgeon: Marty Heck, MD;  Location: [place];  Service: Vascular;  Laterality: Left;   AV FISTULA PLACEMENT Left 08/01/2019   Procedure: LEFT BRACHIOCEPHALIC ARTERIOVENOUS (AV) FISTULA CREATION;  Surgeon: [name], MD;  Location: [place];  Service: Vascular;  Laterality: Left;   [place] Left  10/03/2019   Procedure: BASILIC VEIN TRANSPOSITION LEFT SECOND STAGE;  Surgeon: [name], MD;  Location: [place];  Service: Vascular;  Laterality: Left;   BIOPSY  01/25/2021   Procedure: BIOPSY;  Surgeon: Doran Stabler, MD;  Location: [facility] ENDOSCOPY;  Service: Gastroenterology;;   CARDIAC CATHETERIZATION  10/2002; 12/19/2004   [name] 03/08/2011   COLONOSCOPY  2013   [place]    CORONARY ARTERY BYPASS GRAFT  02/24/2003   CABG X2/notes 03/08/2011   CORONARY STENT INTERVENTION N/A 12/19/2019   Procedure: CORONARY STENT INTERVENTION;  Surgeon: Jettie Booze, MD;  Location: [place] [facility];  Service: Cardiovascular;  Laterality: N/A;   ENTEROSCOPY N/A 01/25/2021   Procedure: ENTEROSCOPY;  Surgeon: Doran Stabler, MD;  Location: [place];  Service: Gastroenterology;  Laterality: N/A;   ENTEROSCOPY N/A 02/13/2021   Procedure: ENTEROSCOPY;  Surgeon: Jackquline Denmark, MD;  Location: [facility] ENDOSCOPY;  Service: Endoscopy;  Laterality: N/A;   ENTEROSCOPY N/A 05/07/2021   Procedure: ENTEROSCOPY;  Surgeon: Yetta Flock, MD;  Location: [facility] ENDOSCOPY;  Service: Gastroenterology;  Laterality: N/A;   ESOPHAGOGASTRODUODENOSCOPY (EGD) WITH PROPOFOL N/A 02/08/2019   Procedure: ESOPHAGOGASTRODUODENOSCOPY (EGD) WITH PROPOFOL;  Surgeon: Milus Banister, MD;  Location: [place];  Service: Gastroenterology;  Laterality: N/A;   ESOPHAGOGASTRODUODENOSCOPY (EGD) WITH PROPOFOL N/A 12/22/2019   Procedure: ESOPHAGOGASTRODUODENOSCOPY (EGD) WITH PROPOFOL;  Surgeon: Lavena Bullion, DO;  Location: [place];  Service: Gastroenterology;  Laterality: N/A;   ESOPHAGOGASTRODUODENOSCOPY (EGD) WITH PROPOFOL N/A 10/19/2020   Procedure: ESOPHAGOGASTRODUODENOSCOPY (EGD) WITH PROPOFOL;  Surgeon: Jackquline Denmark, MD;  Location: [facility] ENDOSCOPY;  Service: Endoscopy;  Laterality: N/A;   ESOPHAGOGASTRODUODENOSCOPY (EGD) WITH PROPOFOL N/A 12/22/2020   Procedure: ESOPHAGOGASTRODUODENOSCOPY (EGD) WITH PROPOFOL;  Surgeon:  Gatha Mayer, MD;  Location: [place];  Service: Endoscopy;  Laterality: N/A;   ESOPHAGOGASTRODUODENOSCOPY (EGD) WITH PROPOFOL N/A 01/09/2021   Procedure: ESOPHAGOGASTRODUODENOSCOPY (EGD) WITH PROPOFOL;  Surgeon: Irene Shipper, MD;  Location: [facility] ENDOSCOPY;  Service: Endoscopy;  Laterality: N/A;   HEMOSTASIS CLIP PLACEMENT  12/22/2019   Procedure: HEMOSTASIS CLIP PLACEMENT;  Surgeon: Lavena Bullion, DO;  Location: [place] ENDOSCOPY;  Service: Gastroenterology;;   HEMOSTASIS CLIP PLACEMENT  12/22/2020   Procedure: HEMOSTASIS CLIP PLACEMENT;  Surgeon: Gatha Mayer, MD;  Location: [facility] ENDOSCOPY;  Service: Endoscopy;;   HEMOSTASIS CONTROL  12/22/2020   Procedure: HEMOSTASIS CONTROL/hemospray;  Surgeon: Gatha Mayer, MD;  Location: [place];  Service: Endoscopy;;   HOT HEMOSTASIS N/A 02/08/2019   Procedure: HOT HEMOSTASIS (ARGON PLASMA COAGULATION/BICAP);  Surgeon: Milus Banister, MD;  Location: [facility] ENDOSCOPY;  Service: Gastroenterology;  Laterality: N/A;   HOT HEMOSTASIS N/A 12/22/2019   Procedure: HOT HEMOSTASIS (ARGON PLASMA COAGULATION/BICAP);  Surgeon: Lavena Bullion, DO;  Location: [facility] ENDOSCOPY;  Service: Gastroenterology;  Laterality: N/A;   HOT HEMOSTASIS N/A 10/19/2020   Procedure: HOT HEMOSTASIS (ARGON PLASMA COAGULATION/BICAP);  Surgeon: Jackquline Denmark, MD;  Location: [facility] ENDOSCOPY;  Service: Endoscopy;  Laterality: N/A;   HOT HEMOSTASIS N/A 12/22/2020   Procedure: HOT HEMOSTASIS (  ARGON PLASMA COAGULATION/BICAP);  Surgeon: Gatha Mayer, MD;  Location: [facility] ENDOSCOPY;  Service: Endoscopy;  Laterality: N/A;   HOT HEMOSTASIS N/A 01/09/2021   Procedure: HOT HEMOSTASIS (ARGON PLASMA COAGULATION/BICAP);  Surgeon: Irene Shipper, MD;  Location: [facility] ENDOSCOPY;  Service: Endoscopy;  Laterality: N/A;   HOT HEMOSTASIS N/A 01/25/2021   Procedure: HOT HEMOSTASIS (ARGON PLASMA COAGULATION/BICAP);  Surgeon: Doran Stabler, MD;  Location: [place];  Service: Gastroenterology;  Laterality: N/A;    HOT HEMOSTASIS N/A 02/13/2021   Procedure: HOT HEMOSTASIS (ARGON PLASMA COAGULATION/BICAP);  Surgeon: Jackquline Denmark, MD;  Location: [facility] ENDOSCOPY;  Service: Endoscopy;  Laterality: N/A;   HOT HEMOSTASIS N/A 05/07/2021   Procedure: HOT HEMOSTASIS (ARGON PLASMA COAGULATION/BICAP);  Surgeon: Yetta Flock, MD;  Location: [facility] ENDOSCOPY;  Service: Gastroenterology;  Laterality: N/A;   INTERTROCHANTERIC HIP FRACTURE SURGERY Left 11/2006   [name] 03/08/2011   INTRAVASCULAR ULTRASOUND/IVUS N/A 12/19/2019   Procedure: Intravascular Ultrasound/IVUS;  Surgeon: Jettie Booze, MD;  Location: [place] CV LAB;  Service: Cardiovascular;  Laterality: N/A;   IR FLUORO GUIDE CV LINE RIGHT  07/24/2019   IR FLUORO GUIDE CV LINE RIGHT  07/30/2019   IR US GUIDE VASC ACCESS RIGHT  07/24/2019   IR US GUIDE VASC ACCESS RIGHT  07/30/2019   LAPAROSCOPIC CHOLECYSTECTOMY  05/2006   LIGATION OF COMPETING BRANCHES OF ARTERIOVENOUS FISTULA Left 11/05/2018   Procedure: LIGATION OF COMPETING BRANCHES OF ARTERIOVENOUS FISTULA  LEFT  ARM;  Surgeon: Marty Heck, MD;  Location: [facility] OR;  Service: Vascular;  Laterality: Left;   LUMBAR LAMINECTOMY/DECOMPRESSION MICRODISCECTOMY N/A 02/29/2016   Procedure: Left L4-5 Lateral Recess Decompression, Removal Extradural Intraspinal Facet Cyst;  Surgeon: Marybelle Killings, MD;  Location: [place];  Service: Orthopedics;  Laterality: N/A;   MULTIPLE TOOTH EXTRACTIONS     ORIF MANDIBULAR FRACTURE Left 08/13/2004   ORIF of left body fracture mandible with KLS Martin 2.3-mm six hole/notes 03/08/2011   RIGHT/LEFT HEART CATH AND CORONARY/GRAFT ANGIOGRAPHY N/A 12/19/2019   Procedure: RIGHT/LEFT HEART CATH AND CORONARY/GRAFT ANGIOGRAPHY;  Surgeon: Jettie Booze, MD;  Location: [place] [facility];  Service: Cardiovascular;  Laterality: N/A;   SCLEROTHERAPY  12/22/2019   Procedure: SCLEROTHERAPY;  Surgeon: Lavena Bullion, DO;  Location: [place];  Service: Gastroenterology;;    Clide Deutscher  02/13/2021   Procedure: Clide Deutscher;  Surgeon: Jackquline Denmark, MD;  Location: [facility] ENDOSCOPY;  Service: Endoscopy;;    Allergies:  Allergies  Allergen Reactions   Augmentin [Amoxicillin-Pot Clavulanate] Diarrhea and Other (See Comments)    Severe diarrhea   Mucinex Fast-Max Other (See Comments)    Intense sweating    Amphetamines Other (See Comments)    Unknown reaction    Medications:   Prior to Admission medications   Medication Sig Start Date End Date Taking? Authorizing Provider  acetaminophen (TYLENOL) 650 MG CR tablet Take 1,300 mg by mouth 3 (three) times daily.    [provider]  allopurinol (ZYLOPRIM) 100 MG tablet TAKE 1 TABLET(100 MG) BY MOUTH DAILY 01/12/21   [name], Dinah C, NP  ANORO ELLIPTA 62.5-25 MCG/INH AEPB INHALE 1 PUFF BY MOUTH EVERY DAY 12/21/20   [name], NP  atorvastatin (LIPITOR) 10 MG tablet Take 1 tablet (10 mg total) by mouth daily. 03/02/21 06/22/21  Charlie Pitter, PA-C  azithromycin (ZITHROMAX) 250 MG tablet Take 2 tablets on day 1, then 1 tablet daily on days 2 through 5 06/22/21 06/27/21  [name], NP  B-D UF III MINI  PEN NEEDLES 31G X 5 MM MISC USE FOUR TIMES DAILY 05/20/21   [name], NP  bictegravir-emtricitabine-tenofovir AF (BIKTARVY) 50-200-25 MG TABS tablet Take 1 tablet by mouth daily. 06/30/20   [name], MD  Calcium Acetate 667 MG TABS Take [phone] mg by mouth See admin instructions. Take 1,334 mg by mouth three times a day with meals and 667 mg with each snack 01/20/21   [provider]  carvedilol (COREG) 3.125 MG tablet TAKE 1 TABLET(3.125 MG) BY MOUTH TWICE DAILY 04/14/21   [name], MD  cefdinir (OMNICEF) 300 MG capsule 1 tablet by mouth every other day after dialysis 06/22/21   [name], NP  colchicine 0.6 MG tablet TAKE 1 TABLET BY MOUTH AS NEEDED FOR GOUT 04/14/21   [name], Dinah C, NP  diclofenac Sodium (VOLTAREN) 1 % GEL APPLY 2 GRAMS TOPICALLY TO THE AFFECTED  AREA THREE TIMES DAILY AS NEEDED FOR PAIN 04/14/21   [name], NP  folic acid (FOLVITE) 1 MG tablet Take 1 tablet (1 mg total) by mouth daily. 01/12/21   [name], Dinah C, NP  gabapentin (NEURONTIN) 300 MG capsule Take 1 capsule (300 mg total) by mouth daily. Dose change due to renal function 12/09/20   [name], NP  glucose blood (ONETOUCH ULTRA) test strip Check blood sugar three times daily. Dx:E11.40 03/02/21   [name], NP  insulin lispro (HUMALOG) 100 UNIT/ML KwikPen INJECT 13 UNITS UNDER THE SKIN THREE TIMES DAILY AS NEEDED FOR HIGH BLOOD SUGAR(ABOVE 150) 05/08/21   Regalado, Belkys A, MD  isosorbide mononitrate (IMDUR) 30 MG 24 hr tablet TAKE 1 TABLET BY MOUTH EVERY DAY 01/12/21   [name], Dinah C, NP  Lancets (ONETOUCH DELICA PLUS [id number]) MISC Inject 1 Device as directed 3 (three) times daily. Dx: E11.40 09/18/19   [name], NP  latanoprost (XALATAN) 0.005 % ophthalmic solution Place 1 drop into both eyes at bedtime.    [provider]  lidocaine-prilocaine (EMLA) cream Apply 1 application topically every Monday, Wednesday, and Friday with hemodialysis.    [provider]  Methoxy PEG-Epoetin Beta (MIRCERA IJ) Mircera 05/19/21 05/18/22  [provider]  multivitamin (RENA-VIT) TABS tablet Take 1 tablet by mouth daily.    [provider]  nitroGLYCERIN (NITROSTAT) 0.3 MG SL tablet PLACE 1 TABLET UNDER THE TONGUE AS NEEDED FOR CHEST PAIN 04/14/21   [name], MD  ondansetron (ZOFRAN) 4 MG tablet Take 1 tablet (4 mg total) by mouth every 8 (eight) hours as needed for nausea or vomiting. 06/15/21   [name], Dinah C, NP  oxyCODONE (OXY IR/ROXICODONE) 5 MG immediate release tablet Take 1 tablet (5 mg total) by mouth every 4 (four) hours as needed for severe pain. Patient not taking: Reported on 06/22/2021 06/03/21   Jessy Oto, MD  oxyCODONE-acetaminophen (PERCOCET/ROXICET) 5-325 MG tablet Take 1 tablet by mouth daily as  needed for severe pain. Patient not taking: Reported on 06/22/2021    [provider]  pantoprazole (PROTONIX) 40 MG tablet Take 1 tablet (40 mg total) by mouth 2 (two) times daily before a meal. 05/08/21   Regalado, Belkys A, MD  polyethylene glycol (MIRALAX / GLYCOLAX) 17 g packet Take 17 g by mouth daily as needed for mild constipation. 11/07/20   Aline August, MD  timolol (TIMOPTIC) 0.5 % ophthalmic solution Place 1 drop into both eyes in the morning. 04/04/19   [provider]  VITAMIN D PO Take by mouth. 05/19/21  05/18/22  [provider]    Discontinued Meds:  There are no discontinued medications.  Social History:  reports that he has been smoking cigarettes. He has a 21.50 pack-year smoking history. He has never used smokeless tobacco. He reports that he does not currently use alcohol after a past usage of about 12.0 standard drinks per week. He reports that he does not currently use drugs after having used the following drugs: Cocaine.  Family History:   Family History  Problem Relation Age of Onset   Heart failure Father    Hypertension Father    Diabetes Brother    Heart attack Brother    Alzheimer's disease Mother    Stroke Sister    Diabetes Sister    Alzheimer's disease Sister    Hypertension Brother    Diabetes Brother    Drug abuse Brother    Colon cancer Neg Hx     Blood pressure (!) 147/71, pulse 78, temperature 98.3 F (36.8 C), temperature source Oral, resp. rate 16, SpO2 94 %. General appearance: alert, cooperative, and appears stated age Head: Normocephalic, without obvious abnormality, atraumatic Eyes: negative Neck: no adenopathy, no carotid bruit, supple, symmetrical, trachea midline, and thyroid not enlarged, symmetric, no tenderness/mass/nodules Back: symmetric, no curvature. ROM normal. No CVA tenderness. Resp: rales bibasilar Chest wall: no tenderness Cardio: regularly regular rhythm GI: soft, non-tender; bowel sounds normal;  no masses,  no organomegaly Extremities: extremities normal, atraumatic, no cyanosis or edema Pulses: 2+ and symmetric Skin: Skin color, texture, turgor normal. No rashes or lesions Access: left BBT great thrill, soft       Marye Eagen, Hunt Oris, MD 06/23/2021, 6:12 PM

## 2021-06-23 NOTE — ED Provider Notes (Cosign Needed)
Emergency Medicine Provider Triage Evaluation Note  Jacob [name] , a 70 y.o. male  was evaluated in triage.  Pt complains of fatigue and missed dialysis x 1 day. He reports he missed dialysis because he was diagnosed with pneumonia. He has not taken his antibiotics of any other medications yet. He denies chest pain, shortness of breath, leg swelling, nausea, vomiting, confusion.  Review of Systems  Positive: As above Negative: As above  Physical Exam  BP (!) 155/90 (BP Location: Right Arm)   Pulse 91   Temp 98.3 F (36.8 C) (Oral)   Resp 18   SpO2 90%  Gen:   Awake, no distress   Resp:  Normal effort  MSK:   Moves extremities without difficulty  Other:    Medical Decision Making  Medically screening exam initiated at 2:45 PM.  Appropriate orders placed.  Jacob [name] was informed that the remainder of the evaluation will be completed by another provider, this initial triage assessment does not replace that evaluation, and the importance of remaining in the ED until their evaluation is complete.  Missed dialysis   Jacob [name], [place] 06/23/21 1446

## 2021-06-23 NOTE — ED Notes (Signed)
Went to room to answer call bell, strong odor of cigarette smoke prevalent. Security called to speak with patient about not smoking in hospital.

## 2021-06-23 NOTE — ED Notes (Signed)
Charge RN advised by security of pt's unwillingness to relinquish cigarettes; pt to give up cigarettes or leave property. Pt refusing to give up cigarettes, to be escorted out of building/off property by security/[employer]

## 2021-06-23 NOTE — Telephone Encounter (Signed)
Patient notified and agreed. [facility] number provided.

## 2021-06-23 NOTE — ED Notes (Signed)
Per Dr. Karle Starch, may dc patient after dialysis. No need for PIV.

## 2021-06-23 NOTE — Telephone Encounter (Signed)
Please let him know that is out of my control. He could possibly call his nephrologist to see if they can arrange him to go to another dialysis center.

## 2021-06-24 DIAGNOSIS — R059 Cough, unspecified: Secondary | ICD-10-CM | POA: Diagnosis not present

## 2021-06-24 LAB — CBC
HCT: 24.1 % — ABNORMAL LOW (ref 39.0–52.0)
Hemoglobin: 7.7 g/dL — ABNORMAL LOW (ref 13.0–17.0)
MCH: 30.3 pg (ref 26.0–34.0)
MCHC: 32 g/dL (ref 30.0–36.0)
MCV: 94.9 fL (ref 80.0–100.0)
Platelets: 227 10*3/uL (ref 150–400)
RBC: 2.54 MIL/uL — ABNORMAL LOW (ref 4.22–5.81)
RDW: 17.9 % — ABNORMAL HIGH (ref 11.5–15.5)
WBC: 7.1 10*3/uL (ref 4.0–10.5)
nRBC: 0 % (ref 0.0–0.2)

## 2021-06-24 LAB — HEPATITIS B SURFACE ANTIGEN: Hepatitis B Surface Ag: NONREACTIVE

## 2021-06-24 MED ORDER — SODIUM CHLORIDE 0.9 % IV SOLN: 100.0000 mL | INTRAVENOUS | Status: DC | PRN

## 2021-06-24 MED ORDER — PENTAFLUOROPROP-TETRAFLUOROETH EX AERO: 1.0000 "application " | INHALATION_SPRAY | CUTANEOUS | Status: DC | PRN

## 2021-06-24 MED ORDER — SUCRALFATE 1 G PO TABS
1.0000 g | ORAL_TABLET | Freq: Once | ORAL | Status: AC
  Administered 2021-06-24: 1 g via ORAL
  Filled 2021-06-24: qty 1

## 2021-06-24 MED ORDER — LIDOCAINE HCL (PF) 1 % IJ SOLN: 5.0000 mL | INTRAMUSCULAR | Status: DC | PRN

## 2021-06-24 MED ORDER — ALTEPLASE 2 MG IJ SOLR: 2.0000 mg | Freq: Once | INTRAMUSCULAR | Status: DC | PRN

## 2021-06-24 MED ORDER — LIDOCAINE-PRILOCAINE 2.5-2.5 % EX CREA: 1.0000 "application " | TOPICAL_CREAM | CUTANEOUS | Status: DC | PRN

## 2021-06-24 MED ORDER — HEPARIN SODIUM (PORCINE) 1000 UNIT/ML DIALYSIS
1000.0000 [IU] | INTRAMUSCULAR | Status: DC | PRN
  Filled 2021-06-24: qty 1

## 2021-06-24 NOTE — ED Notes (Signed)
Confirmed with dialysis that they are ready for pt. Per dialysis, pt can come to [facility].

## 2021-06-24 NOTE — ED Notes (Signed)
Pt moved from rm 7 to rm 9 for closer access to RR.

## 2021-06-24 NOTE — ED Notes (Signed)
Pt ambulatory to RR w cane, no assistance from staff needed

## 2021-06-24 NOTE — Discharge Instructions (Addendum)
1.  You were dialyzed in the emergency department.  Resume your normal dialysis schedule. 2.  Return to the emergency department if you are having new, sudden or worsening shortness of breath chest pain or other concerning symptoms. 3.  Resume your usual medication regimen follow-up with your nephrologist as soon as possible.

## 2021-06-25 ENCOUNTER — Encounter: Payer: Self-pay | Admitting: Internal Medicine

## 2021-06-25 DIAGNOSIS — Z992 Dependence on renal dialysis: Secondary | ICD-10-CM | POA: Diagnosis not present

## 2021-06-25 DIAGNOSIS — D509 Iron deficiency anemia, unspecified: Secondary | ICD-10-CM | POA: Diagnosis not present

## 2021-06-25 DIAGNOSIS — D631 Anemia in chronic kidney disease: Secondary | ICD-10-CM | POA: Diagnosis not present

## 2021-06-25 DIAGNOSIS — N186 End stage renal disease: Secondary | ICD-10-CM | POA: Diagnosis not present

## 2021-06-25 DIAGNOSIS — N2581 Secondary hyperparathyroidism of renal origin: Secondary | ICD-10-CM | POA: Diagnosis not present

## 2021-06-25 DIAGNOSIS — D649 Anemia, unspecified: Secondary | ICD-10-CM | POA: Diagnosis not present

## 2021-06-25 LAB — RENAL FUNCTION PANEL
Albumin: 2.5 g/dL — ABNORMAL LOW (ref 3.5–5.0)
Anion gap: 13 (ref 5–15)
BUN: 128 mg/dL — ABNORMAL HIGH (ref 8–23)
CO2: 22 mmol/L (ref 22–32)
Calcium: 9.3 mg/dL (ref 8.9–10.3)
Chloride: 98 mmol/L (ref 98–111)
Creatinine, Ser: 11.14 mg/dL — ABNORMAL HIGH (ref 0.61–1.24)
GFR, Estimated: 4 mL/min — ABNORMAL LOW (ref >60)
Glucose, Bld: 133 mg/dL — ABNORMAL HIGH (ref 70–99)
Phosphorus: 5.2 mg/dL — ABNORMAL HIGH (ref 2.5–4.6)
Potassium: 6.6 mmol/L (ref 3.5–5.1)
Sodium: 133 mmol/L — ABNORMAL LOW (ref 135–145)

## 2021-06-28 DIAGNOSIS — D631 Anemia in chronic kidney disease: Secondary | ICD-10-CM | POA: Diagnosis not present

## 2021-06-28 DIAGNOSIS — Z992 Dependence on renal dialysis: Secondary | ICD-10-CM | POA: Diagnosis not present

## 2021-06-28 DIAGNOSIS — D509 Iron deficiency anemia, unspecified: Secondary | ICD-10-CM | POA: Diagnosis not present

## 2021-06-28 DIAGNOSIS — N186 End stage renal disease: Secondary | ICD-10-CM | POA: Diagnosis not present

## 2021-06-28 DIAGNOSIS — D649 Anemia, unspecified: Secondary | ICD-10-CM | POA: Diagnosis not present

## 2021-06-28 DIAGNOSIS — N2581 Secondary hyperparathyroidism of renal origin: Secondary | ICD-10-CM | POA: Diagnosis not present

## 2021-06-30 DIAGNOSIS — D509 Iron deficiency anemia, unspecified: Secondary | ICD-10-CM | POA: Diagnosis not present

## 2021-06-30 DIAGNOSIS — N186 End stage renal disease: Secondary | ICD-10-CM | POA: Diagnosis not present

## 2021-06-30 DIAGNOSIS — D631 Anemia in chronic kidney disease: Secondary | ICD-10-CM | POA: Diagnosis not present

## 2021-06-30 DIAGNOSIS — Z992 Dependence on renal dialysis: Secondary | ICD-10-CM | POA: Diagnosis not present

## 2021-06-30 DIAGNOSIS — N2581 Secondary hyperparathyroidism of renal origin: Secondary | ICD-10-CM | POA: Diagnosis not present

## 2021-06-30 DIAGNOSIS — D649 Anemia, unspecified: Secondary | ICD-10-CM | POA: Diagnosis not present

## 2021-07-01 ENCOUNTER — Telehealth: Payer: Self-pay

## 2021-07-01 ENCOUNTER — Other Ambulatory Visit: Payer: Self-pay | Admitting: Internal Medicine

## 2021-07-01 DIAGNOSIS — B20 Human immunodeficiency virus [HIV] disease: Secondary | ICD-10-CM

## 2021-07-01 NOTE — Telephone Encounter (Signed)
Incoming call received from patient requesting more information about pneumonia and what to expect. Patient states he took the last pill on Monday, started with diarrhea and frequent bowel movements on Tuesday.   Patient is treating diarrhea with imodium and questions if this is to be expected and if so when should he expect diarrhea to resolve.  Patient states he ate yogurt while on antibiotic  Please advise

## 2021-07-01 NOTE — Telephone Encounter (Signed)
Yes likely has diarrhea from the medication, would recommend bland diet, advance as tolerates.  Can use probiotic, it should start to improve since he is off antibiotics

## 2021-07-01 NOTE — Telephone Encounter (Signed)
Discussed results with patient, patient verbalized understanding of results  Patient questions when should he be seen again routinely

## 2021-07-02 DIAGNOSIS — N186 End stage renal disease: Secondary | ICD-10-CM | POA: Diagnosis not present

## 2021-07-02 DIAGNOSIS — D509 Iron deficiency anemia, unspecified: Secondary | ICD-10-CM | POA: Diagnosis not present

## 2021-07-02 DIAGNOSIS — D649 Anemia, unspecified: Secondary | ICD-10-CM | POA: Diagnosis not present

## 2021-07-02 DIAGNOSIS — N2581 Secondary hyperparathyroidism of renal origin: Secondary | ICD-10-CM | POA: Diagnosis not present

## 2021-07-02 DIAGNOSIS — Z992 Dependence on renal dialysis: Secondary | ICD-10-CM | POA: Diagnosis not present

## 2021-07-02 DIAGNOSIS — D631 Anemia in chronic kidney disease: Secondary | ICD-10-CM | POA: Diagnosis not present

## 2021-07-03 ENCOUNTER — Telehealth: Payer: Self-pay | Admitting: Adult Health

## 2021-07-03 NOTE — Telephone Encounter (Signed)
Pt called the oncall service and stated that he just completed an antibiotic for pneumonia. He does not feel he is getting better. He is having some back pain which he says sometimes happens right before he needs dialysis again. He also feels tired, weak, and not getting better. He does not have a fever or chills. He says he had some sob last night but that subsided. Due to the fact that he is high risk I suggested he go to the ER or urgent care for further evaluation but he declined. He would prefer to call the office back and be seen on Monday. I let him know that if he experiences any sob or cp he should go to the nearest ER.

## 2021-07-04 DIAGNOSIS — R9431 Abnormal electrocardiogram [ECG] [EKG]: Secondary | ICD-10-CM | POA: Diagnosis not present

## 2021-07-04 DIAGNOSIS — F32A Depression, unspecified: Secondary | ICD-10-CM | POA: Diagnosis not present

## 2021-07-04 DIAGNOSIS — I4510 Unspecified right bundle-branch block: Secondary | ICD-10-CM | POA: Diagnosis not present

## 2021-07-04 DIAGNOSIS — I499 Cardiac arrhythmia, unspecified: Secondary | ICD-10-CM | POA: Diagnosis not present

## 2021-07-05 DIAGNOSIS — N2581 Secondary hyperparathyroidism of renal origin: Secondary | ICD-10-CM | POA: Diagnosis not present

## 2021-07-05 DIAGNOSIS — D631 Anemia in chronic kidney disease: Secondary | ICD-10-CM | POA: Diagnosis not present

## 2021-07-05 DIAGNOSIS — N186 End stage renal disease: Secondary | ICD-10-CM | POA: Diagnosis not present

## 2021-07-05 DIAGNOSIS — D509 Iron deficiency anemia, unspecified: Secondary | ICD-10-CM | POA: Diagnosis not present

## 2021-07-05 DIAGNOSIS — Z992 Dependence on renal dialysis: Secondary | ICD-10-CM | POA: Diagnosis not present

## 2021-07-05 DIAGNOSIS — D649 Anemia, unspecified: Secondary | ICD-10-CM | POA: Diagnosis not present

## 2021-07-05 NOTE — Telephone Encounter (Signed)
Can we call and follow up on Mr Hevia? He needs an in office follow up

## 2021-07-05 NOTE — Telephone Encounter (Signed)
Patient stated that he is allergic to Mucinex and the Melatonin does not work.   I offered him an appointment for everyday this week and nothing works for him.   Stated that he will call back to schedule an appointment.

## 2021-07-05 NOTE — Telephone Encounter (Signed)
Patient called and stated that he called 911 Saturday and they came out and evaluated patient.  Patient stated that his blood pressure is going up and down. Patient cannot sleep. Stated that he just has some congestion and its in his voice. No Fever. Just doesn't feel well. No other symptom noted.   I offered patient an appointment for evaluation and he is at dialysis and cannot come. Has a hard time finding a ride.    Patient is requesting something to help him rest and wonders if he should take another round of antibiotic.   Please Advise.

## 2021-07-05 NOTE — Telephone Encounter (Signed)
Awaiting reply

## 2021-07-05 NOTE — Telephone Encounter (Signed)
  It may take several days or weeks before the cough improves this is typically the last symptoms to improve. Continue mucinex DM for symptom management.  He can use melatonin 1 mg by mouth at bedtime to help rest  He needs an evaluation before anything else can be recommended or prescribed.

## 2021-07-05 NOTE — Telephone Encounter (Signed)
Correct for an acute visit. Patient was questioning when should he schedule his next routine visit.

## 2021-07-05 NOTE — Telephone Encounter (Signed)
He is seeing Dr Sabra Heck tomorrow.

## 2021-07-06 ENCOUNTER — Ambulatory Visit: Payer: Medicare Other | Admitting: Family Medicine

## 2021-07-07 DIAGNOSIS — N2581 Secondary hyperparathyroidism of renal origin: Secondary | ICD-10-CM | POA: Diagnosis not present

## 2021-07-07 DIAGNOSIS — D631 Anemia in chronic kidney disease: Secondary | ICD-10-CM | POA: Diagnosis not present

## 2021-07-07 DIAGNOSIS — N186 End stage renal disease: Secondary | ICD-10-CM | POA: Diagnosis not present

## 2021-07-07 DIAGNOSIS — D509 Iron deficiency anemia, unspecified: Secondary | ICD-10-CM | POA: Diagnosis not present

## 2021-07-07 DIAGNOSIS — Z992 Dependence on renal dialysis: Secondary | ICD-10-CM | POA: Diagnosis not present

## 2021-07-07 DIAGNOSIS — D649 Anemia, unspecified: Secondary | ICD-10-CM | POA: Diagnosis not present

## 2021-07-07 NOTE — Telephone Encounter (Signed)
   [facility] Pre-operative Risk Assessment    Patient Name: Jacob [name]  DOB: 1951/01/22 MRN: [id number]  HEARTCARE STAFF:  - IMPORTANT!!!!!! Under Visit Info/Reason for Call, type in Other and utilize the format Clearance MM/DD/YY or Clearance TBD. Do not use dashes or single digits. - Please review there is not already an duplicate clearance open for this procedure. - If request is for dental extraction, please clarify the # of teeth to be extracted. - If the patient is currently at the dentist's office, call Pre-Op Callback Staff (MA/nurse) to input urgent request.  - If the patient is not currently in the dentist office, please route to the Pre-Op pool.  Request for surgical clearance:  What type of surgery is being performed? Lumbar Fusions  When is this surgery scheduled? TBD  What type of clearance is required (medical clearance vs. Pharmacy clearance to hold med vs. Both)? Medical  Are there any medications that need to be held prior to surgery and how long? None listed  Practice name and name of physician performing surgery? [facility], Dr Louanne Skye  What is the office phone number? [phone]   7.   What is the office fax number? [phone]  8.   Anesthesia type (None, local, MAC, general) ? General   Terriyah Westra L 07/07/2021, 4:30 PM  _________________________________________________________________   (provider comments below)

## 2021-07-08 ENCOUNTER — Other Ambulatory Visit: Payer: Self-pay

## 2021-07-08 ENCOUNTER — Encounter (HOSPITAL_COMMUNITY)
Admission: RE | Admit: 2021-07-08 | Discharge: 2021-07-08 | Disposition: A | Discharge status: Home / Self Care | Payer: Medicare Other | Source: Ambulatory Visit | Attending: Gastroenterology | Admitting: Gastroenterology

## 2021-07-08 ENCOUNTER — Telehealth: Payer: Self-pay

## 2021-07-08 ENCOUNTER — Other Ambulatory Visit (HOSPITAL_COMMUNITY): Payer: Self-pay

## 2021-07-08 ENCOUNTER — Encounter (HOSPITAL_COMMUNITY): Payer: Self-pay

## 2021-07-08 ENCOUNTER — Emergency Department (HOSPITAL_COMMUNITY): Payer: Medicare Other

## 2021-07-08 ENCOUNTER — Ambulatory Visit (INDEPENDENT_AMBULATORY_CARE_PROVIDER_SITE_OTHER): Payer: Medicare Other | Admitting: Internal Medicine

## 2021-07-08 ENCOUNTER — Encounter: Payer: Self-pay | Admitting: Internal Medicine

## 2021-07-08 ENCOUNTER — Emergency Department (HOSPITAL_COMMUNITY)
Admission: EM | Admit: 2021-07-08 | Discharge: 2021-07-09 | Disposition: A | Discharge status: Home / Self Care | Payer: Medicare Other | Attending: Emergency Medicine | Admitting: Emergency Medicine

## 2021-07-08 DIAGNOSIS — N186 End stage renal disease: Secondary | ICD-10-CM | POA: Insufficient documentation

## 2021-07-08 DIAGNOSIS — Q2730 Arteriovenous malformation, site unspecified: Secondary | ICD-10-CM

## 2021-07-08 DIAGNOSIS — Z7951 Long term (current) use of inhaled steroids: Secondary | ICD-10-CM | POA: Diagnosis not present

## 2021-07-08 DIAGNOSIS — Z992 Dependence on renal dialysis: Secondary | ICD-10-CM | POA: Diagnosis not present

## 2021-07-08 DIAGNOSIS — E8770 Fluid overload, unspecified: Secondary | ICD-10-CM

## 2021-07-08 DIAGNOSIS — Z79899 Other long term (current) drug therapy: Secondary | ICD-10-CM | POA: Insufficient documentation

## 2021-07-08 DIAGNOSIS — E1122 Type 2 diabetes mellitus with diabetic chronic kidney disease: Secondary | ICD-10-CM | POA: Diagnosis not present

## 2021-07-08 DIAGNOSIS — Z8616 Personal history of COVID-19: Secondary | ICD-10-CM | POA: Diagnosis not present

## 2021-07-08 DIAGNOSIS — R079 Chest pain, unspecified: Secondary | ICD-10-CM | POA: Diagnosis not present

## 2021-07-08 DIAGNOSIS — E1142 Type 2 diabetes mellitus with diabetic polyneuropathy: Secondary | ICD-10-CM | POA: Diagnosis not present

## 2021-07-08 DIAGNOSIS — I132 Hypertensive heart and chronic kidney disease with heart failure and with stage 5 chronic kidney disease, or end stage renal disease: Secondary | ICD-10-CM | POA: Diagnosis not present

## 2021-07-08 DIAGNOSIS — Z955 Presence of coronary angioplasty implant and graft: Secondary | ICD-10-CM | POA: Diagnosis not present

## 2021-07-08 DIAGNOSIS — Z21 Asymptomatic human immunodeficiency virus [HIV] infection status: Secondary | ICD-10-CM | POA: Diagnosis not present

## 2021-07-08 DIAGNOSIS — J449 Chronic obstructive pulmonary disease, unspecified: Secondary | ICD-10-CM | POA: Insufficient documentation

## 2021-07-08 DIAGNOSIS — Z794 Long term (current) use of insulin: Secondary | ICD-10-CM | POA: Insufficient documentation

## 2021-07-08 DIAGNOSIS — R5383 Other fatigue: Secondary | ICD-10-CM | POA: Insufficient documentation

## 2021-07-08 DIAGNOSIS — E1140 Type 2 diabetes mellitus with diabetic neuropathy, unspecified: Secondary | ICD-10-CM | POA: Diagnosis not present

## 2021-07-08 DIAGNOSIS — A048 Other specified bacterial intestinal infections: Secondary | ICD-10-CM

## 2021-07-08 DIAGNOSIS — I5042 Chronic combined systolic (congestive) and diastolic (congestive) heart failure: Secondary | ICD-10-CM | POA: Insufficient documentation

## 2021-07-08 DIAGNOSIS — I2510 Atherosclerotic heart disease of native coronary artery without angina pectoris: Secondary | ICD-10-CM | POA: Diagnosis not present

## 2021-07-08 DIAGNOSIS — Z743 Need for continuous supervision: Secondary | ICD-10-CM | POA: Diagnosis not present

## 2021-07-08 DIAGNOSIS — K219 Gastro-esophageal reflux disease without esophagitis: Secondary | ICD-10-CM

## 2021-07-08 DIAGNOSIS — S2231XA Fracture of one rib, right side, initial encounter for closed fracture: Secondary | ICD-10-CM | POA: Diagnosis not present

## 2021-07-08 DIAGNOSIS — F17210 Nicotine dependence, cigarettes, uncomplicated: Secondary | ICD-10-CM | POA: Diagnosis not present

## 2021-07-08 DIAGNOSIS — R0602 Shortness of breath: Secondary | ICD-10-CM | POA: Diagnosis not present

## 2021-07-08 DIAGNOSIS — R531 Weakness: Secondary | ICD-10-CM | POA: Insufficient documentation

## 2021-07-08 DIAGNOSIS — I120 Hypertensive chronic kidney disease with stage 5 chronic kidney disease or end stage renal disease: Secondary | ICD-10-CM | POA: Diagnosis not present

## 2021-07-08 DIAGNOSIS — I517 Cardiomegaly: Secondary | ICD-10-CM | POA: Diagnosis not present

## 2021-07-08 DIAGNOSIS — R6889 Other general symptoms and signs: Secondary | ICD-10-CM | POA: Diagnosis not present

## 2021-07-08 LAB — BASIC METABOLIC PANEL
Anion gap: 13 (ref 5–15)
BUN: 51 mg/dL — ABNORMAL HIGH (ref 8–23)
CO2: 27 mmol/L (ref 22–32)
Calcium: 9.1 mg/dL (ref 8.9–10.3)
Chloride: 91 mmol/L — ABNORMAL LOW (ref 98–111)
Creatinine, Ser: 6.74 mg/dL — ABNORMAL HIGH (ref 0.61–1.24)
GFR, Estimated: 8 mL/min — ABNORMAL LOW (ref >60)
Glucose, Bld: 173 mg/dL — ABNORMAL HIGH (ref 70–99)
Potassium: 4.3 mmol/L (ref 3.5–5.1)
Sodium: 131 mmol/L — ABNORMAL LOW (ref 135–145)

## 2021-07-08 LAB — CBC
HCT: 25.6 % — ABNORMAL LOW (ref 39.0–52.0)
Hemoglobin: 7.9 g/dL — ABNORMAL LOW (ref 13.0–17.0)
MCH: 30 pg (ref 26.0–34.0)
MCHC: 30.9 g/dL (ref 30.0–36.0)
MCV: 97.3 fL (ref 80.0–100.0)
Platelets: 214 10*3/uL (ref 150–400)
RBC: 2.63 MIL/uL — ABNORMAL LOW (ref 4.22–5.81)
RDW: 17.7 % — ABNORMAL HIGH (ref 11.5–15.5)
WBC: 4.7 10*3/uL (ref 4.0–10.5)
nRBC: 0 % (ref 0.0–0.2)

## 2021-07-08 LAB — TROPONIN I (HIGH SENSITIVITY): Troponin I (High Sensitivity): 27 ng/L — ABNORMAL HIGH (ref <18)

## 2021-07-08 LAB — BRAIN NATRIURETIC PEPTIDE: B Natriuretic Peptide: 2737.5 pg/mL — ABNORMAL HIGH (ref 0.0–100.0)

## 2021-07-08 MED ORDER — OCTREOTIDE ACETATE 10 MG IM KIT
10.0000 mg | PACK | INTRAMUSCULAR | 3 refills | Status: DC
  Filled 2021-07-08: qty 1, 28d supply, fill #0
  Filled 2021-08-30: qty 1, 28d supply, fill #1
  Filled 2021-09-30: qty 1, 28d supply, fill #2
  Filled 2021-12-01: qty 1, 28d supply, fill #3

## 2021-07-08 MED ORDER — OCTREOTIDE ACETATE 10 MG IM KIT
10.0000 mg | PACK | INTRAMUSCULAR | Status: DC
  Administered 2021-07-08: 10 mg via INTRAMUSCULAR

## 2021-07-08 MED ORDER — PANTOPRAZOLE SODIUM 40 MG PO TBEC: 40.0000 mg | DELAYED_RELEASE_TABLET | Freq: Two times a day (BID) | ORAL | 2 refills | Status: DC

## 2021-07-08 MED ORDER — BIS SUBCIT-METRONID-TETRACYC 140-125-125 MG PO CAPS: 3.0000 | ORAL_CAPSULE | Freq: Three times a day (TID) | ORAL | 0 refills | Status: DC

## 2021-07-08 NOTE — Telephone Encounter (Signed)
Patient called to report feeling increasingly weak and unwell since office visit this morning. Contacted his HD center to inquire whether he was able to get iron infusion; reports he won't be able to get it until next week. Patient reports feeling "like my blood is low" and asked if he should go to the emergency room. RN instructed patient to go to the ED if he was feeling as poorly as he was describing and was concerned about his labs.   Lorrinda Ramstad Lorita Officer, RN

## 2021-07-08 NOTE — Telephone Encounter (Signed)
Called patient to discuss scheduling an appointment for pre-op clearance but was unsuccessful. Will route to scheduling to see if they can be of assistance.

## 2021-07-08 NOTE — ED Triage Notes (Signed)
Pt here from home via [facility] for increased weakness and exertional shob. Pt gets Dialysis MWF, went yesterday and felt weak after, said he felt anemic. Was recently treated for pneumonia, reported nonproductive cough. 132/68, 80 HR, 20 RR, CBG 244, 20g R wrist

## 2021-07-08 NOTE — Assessment & Plan Note (Signed)
I reviewed treatment options with our ID pharmacist, Daisy Lazar, again today and we do not find any contraindication to using the components of Pylera (bismuth, metronidazole and tetracycline) in patients who are on hemodialysis.  I also discussed the situation with his nephrologist, Dr. Pearson Grippe and he is in agreement with using this regimen.  Jacob [name] is now willing to try it.  I will have him come back next week with his new bottle of Pylera and a refill of his Protonix so that he can review how to take the regimen with Jacob [name].  I will see him back in 3 months for repeat breath test.

## 2021-07-08 NOTE — Progress Notes (Signed)
Patient brought in octreotide from [facility] pharmacy.  According to the patient, Dr Havery Moros had called the script in and told him he could pick it up and bring it to the infusion clinic with him.  Marcie, in the pharmacy stated that is against [employer] and to have the patient's Doctor send the script to the [facility] and they would send it to [place] from there prior to his appt.  I called Dr Doyne Keel office and spoke with [facility] regarding this and she is calling them to have this arranged for future appointments.  Marcie stated it was okay to use the dose today from [facility] as this would be costly for the patient not to.  Pt verbalized understanding of the new arrangement and stated he would call the day before his next appointment to verify we would have the medication.

## 2021-07-08 NOTE — Telephone Encounter (Signed)
Received a call from [place] at the [facility], she states that patient has been bringing in Octreotide but she has now been told that we need to send the RX to [facility] pharmacy because they can't verify how the patient has been storing the medication. New prescription sent to pharmacy.

## 2021-07-08 NOTE — Progress Notes (Addendum)
Patient Active Problem List   Diagnosis Date Noted   Helicobacter pylori (H. pylori) infection 04/06/2021    Priority: High   Human immunodeficiency virus (HIV) disease ([place]) 09/02/2006    Priority: High   Chronic lower back pain    Thrombocytopenia (HCC)    Hypercalcemia 04/13/2021   Lumbar foraminal stenosis 03/30/2021   Gastric hemorrhage due to angiodysplasia of stomach    GI bleed 11/24/2020   COVID-19 virus infection 11/24/2020   Other mechanical complication of cranial or spinal infusion catheter, sequela 11/11/2020   Subdural hematoma 11/06/2020   Fall at home, initial encounter 11/05/2020   Nicotine dependence, cigarettes, uncomplicated 56/21/3086   Alcohol abuse 11/05/2020   Unspecified trochanteric fracture of left femur, initial encounter for closed fracture ([place]) 11/05/2020   Traumatic subdural hematoma, initial encounter 11/05/2020   Fracture of metatarsal of right foot, closed 11/05/2020   Nondisplaced fracture of greater trochanter of left femur, initial encounter for closed fracture ([place])    Uremia    Angiodysplasia of intestine with hemorrhage    Pruritus, unspecified 10/02/2020   Iron deficiency anemia    Acute blood loss anemia    Acute on chronic anemia    Abnormal nuclear stress test 12/19/2019   Type 2 diabetes mellitus with diabetic neuropathy, unspecified ([place]) 08/07/2019   Allergy, unspecified, initial encounter 57/84/6962   Complication of vascular dialysis catheter 08/02/2019   Drug abuse counseling and surveillance of drug abuser 08/02/2019   Other specified coagulation defects ([place]) 08/02/2019   Secondary hyperparathyroidism of renal origin ([place]) 08/02/2019   Unspecified atrial fibrillation ([place]) 08/02/2019   ESRD on hemodialysis ([place])    Cocaine abuse ([place])    CHF exacerbation ([place]) 07/21/2019   Diabetic foot ulcer ([place]) 02/11/2019   AVM (arteriovenous malformation)    Melena 02/07/2019   Symptomatic anemia 02/06/2019    Hyperlipidemia associated with type 2 diabetes mellitus ([place]) 05/30/2018   Right foot ulcer ([place]) 02/28/2018   Chronic obstructive pulmonary disease ([place]) 02/28/2018   Anemia of chronic disease 11/20/2016   CHF (congestive heart failure) ([place]) 11/10/2016   History of lumbar laminectomy for spinal cord decompression 02/29/2016   Type 2 diabetes mellitus with diabetic polyneuropathy, with long-term current use of insulin ([place]) 06/17/2015   HTN (hypertension) 04/26/2015   DM (diabetes mellitus) ([place]) 04/26/2015   Ischemic cardiomyopathy 05/12/2014   Shock ([place]) 09/06/2013   Bunion of left foot 11/25/2011   Bunion, right foot 11/25/2011   Polysubstance abuse ([place]) 07/28/2011   Hip fracture, left ([place]) 04/04/2011   Closed fracture of neck of femur ([place]) 04/04/2011   Insomnia 02/18/2011   Coronary artery disease involving native heart without angina pectoris 04/20/2009   Chronic combined systolic (congestive) and diastolic (congestive) heart failure ([place]) 04/20/2009   Mixed hyperlipidemia 11/20/2006   Gout 11/20/2006   TOBACCO ABUSE 11/20/2006   Essential hypertension 11/20/2006    Patient's Medications  New Prescriptions   BISMUTH-METRONIDAZOLE-TETRACYCLINE (PYLERA) 140-125-125 MG CAPSULE    Take 3 capsules by mouth 4 (four) times daily -  before meals and at bedtime.  Previous Medications   ACETAMINOPHEN (TYLENOL) 650 MG CR TABLET    Take 1,300 mg by mouth 3 (three) times daily.   ALLOPURINOL (ZYLOPRIM) 100 MG TABLET    TAKE 1 TABLET(100 MG) BY MOUTH DAILY   ANORO ELLIPTA 62.5-25 MCG/INH AEPB    INHALE 1 PUFF BY MOUTH EVERY DAY   ATORVASTATIN (LIPITOR) 10 MG TABLET    Take  1 tablet (10 mg total) by mouth daily.   B-D UF III MINI PEN NEEDLES 31G X 5 MM MISC    USE FOUR TIMES DAILY   BIKTARVY 50-200-25 MG TABS TABLET    TAKE 1 TABLET BY MOUTH 1 TIME A DAY.   CALCIUM ACETATE 667 MG TABS    Take 667-1,334 mg by mouth See admin instructions. Take 1,334 mg by mouth three times a day with  meals and 667 mg with each snack   CARVEDILOL (COREG) 3.125 MG TABLET    TAKE 1 TABLET(3.125 MG) BY MOUTH TWICE DAILY   CEFDINIR (OMNICEF) 300 MG CAPSULE    1 tablet by mouth every other day after dialysis   COLCHICINE 0.6 MG TABLET    TAKE 1 TABLET BY MOUTH AS NEEDED FOR GOUT   DICLOFENAC SODIUM (VOLTAREN) 1 % GEL    APPLY 2 GRAMS TOPICALLY TO THE AFFECTED AREA THREE TIMES DAILY AS NEEDED FOR PAIN   FOLIC ACID (FOLVITE) 1 MG TABLET    Take 1 tablet (1 mg total) by mouth daily.   GABAPENTIN (NEURONTIN) 300 MG CAPSULE    Take 1 capsule (300 mg total) by mouth daily. Dose change due to renal function   GLUCOSE BLOOD (ONETOUCH ULTRA) TEST STRIP    Check blood sugar three times daily. Dx:E11.40   INSULIN LISPRO (HUMALOG) 100 UNIT/ML KWIKPEN    INJECT 13 UNITS UNDER THE SKIN THREE TIMES DAILY AS NEEDED FOR HIGH BLOOD SUGAR(ABOVE 150)   ISOSORBIDE MONONITRATE (IMDUR) 30 MG 24 HR TABLET    TAKE 1 TABLET BY MOUTH EVERY DAY   LANCETS (ONETOUCH DELICA PLUS [id number]) MISC    Inject 1 Device as directed 3 (three) times daily. Dx: E11.40   LATANOPROST (XALATAN) 0.005 % OPHTHALMIC SOLUTION    Place 1 drop into both eyes at bedtime.   LIDOCAINE-PRILOCAINE (EMLA) CREAM    Apply 1 application topically every Monday, Wednesday, and Friday with hemodialysis.   METHOXY PEG-EPOETIN BETA (MIRCERA IJ)    Mircera   MULTIVITAMIN (RENA-VIT) TABS TABLET    Take 1 tablet by mouth daily.   NITROGLYCERIN (NITROSTAT) 0.3 MG SL TABLET    PLACE 1 TABLET UNDER THE TONGUE AS NEEDED FOR CHEST PAIN   ONDANSETRON (ZOFRAN) 4 MG TABLET    Take 1 tablet (4 mg total) by mouth every 8 (eight) hours as needed for nausea or vomiting.   OXYCODONE (OXY IR/ROXICODONE) 5 MG IMMEDIATE RELEASE TABLET    Take 1 tablet (5 mg total) by mouth every 4 (four) hours as needed for severe pain.   OXYCODONE-ACETAMINOPHEN (PERCOCET/ROXICET) 5-325 MG TABLET    Take 1 tablet by mouth daily as needed for severe pain.   POLYETHYLENE GLYCOL (MIRALAX / GLYCOLAX)  17 G PACKET    Take 17 g by mouth daily as needed for mild constipation.   TIMOLOL (TIMOPTIC) 0.5 % OPHTHALMIC SOLUTION    Place 1 drop into both eyes in the morning.   VITAMIN D PO    Take by mouth.  Modified Medications   Modified Medication Previous Medication   PANTOPRAZOLE (PROTONIX) 40 MG TABLET pantoprazole (PROTONIX) 40 MG tablet      Take 1 tablet (40 mg total) by mouth 2 (two) times daily before a meal.    Take 1 tablet (40 mg total) by mouth 2 (two) times daily before a meal.  Discontinued Medications   No medications on file    Subjective: Jacob [name] is in for his routine follow-up visit.  He developed  upper GI bleeding in early April of this year and was found to have Helicobacter pylori gastritis.  He was treated with metronidazole, doxycycline and omeprazole.  He was told that he should not take bismuth.  Follow-up breath test was still positive in May.  I treated him with clarithromycin, amoxicillin and omeprazole in June.  I had reviewed whether or not there was a contraindication to visit with due to his hemodialysis and there did not appear to be but he still refused to take it as part of his regimen.  Follow-up of breath test in August was still positive.  He is now on iron infusions for his anemia.    Review of Systems: Review of Systems  Constitutional:  Negative for fever.  Respiratory:  Negative for cough.   Cardiovascular:  Positive for chest pain.  Gastrointestinal:  Negative for blood in stool, heartburn, nausea and vomiting.   Past Medical History:  Diagnosis Date   Acute respiratory failure ([place]) 03/01/2018   Anemia    Arthritis    "all over; mostly knees and back" (02/28/2018)   Chronic combined systolic and diastolic CHF (congestive heart failure) (HCC)    Chronic lower back pain    stenosis   Community acquired pneumonia 09/06/2013   COPD (chronic obstructive pulmonary disease) ([place])    Coronary atherosclerosis of native coronary artery    a. 02/2003 s/p CABG x 2  (VG->RI, VG->RPDA; b. 11/2019 PCI: LM nl, LAD 90d, D3 50, RI 100, LCX 100p, OM3 100 - fills via L->L collats from D2/dLAD, RCA 100p, VG->RPDA ok, VG->RI 95 (3.5x48 Synergy XD DES).   Drug abuse ([place])    hx; tested for cocaine as recently as 2/08. says he is not using drugs now - avoided defib. for this reason    ESRD (end stage renal disease) ([place])    Hemo M-W-F- [facility]   Fall at home 10/2020   GERD (gastroesophageal reflux disease)    takes OTC meds as needed   GI bleeding    a. 11/2019 EGD: angiodysplastic lesions w/ bleeding s/p argon plasma/clipping/epi inj. Multiple admissions for the same.   Glaucoma    uses eye drops daily   Hepatitis B 1968   "tx'd w/isolation; caught it from toilet stools in gym"   History of blood transfusion 03/01/2019   History of colon polyps    benign   History of gout    takes Allopurinol daily as well as Colchicine-if needed (02/28/2018)   History of kidney stones    HTN (hypertension)    takes Coreg,Imdur.and Apresoline daily   Human immunodeficiency virus (HIV) disease ([place]) dx'd 1995   on [place] as of 12/2020.     Hyperlipidemia    Ischemic cardiomyopathy    a. 01/2019 Echo: EF 40-45%, diffuse HK, mild basal septal hypertrophy. Diast dysfxn. Nl RV size/fxn. Sev dil LA. Triv MR/TR/PR.   Muscle spasm    takes Zanaflex as needed   Myocardial infarction ([place]) ~ 2004/2005   Nocturia    Peripheral neuropathy    takes gabapentin daily   Pneumonia    "at least twice" (02/28/2018)   SDH (subdural hematoma)    Syphilis, unspecified    Type II diabetes mellitus ([place]) 2004   Lantus daily.Average fasting blood sugar 125-199   Wears glasses    Wears partial dentures     Social History   Tobacco Use   Smoking status: Every Day    Packs/day: 0.50    Years: 43.00  Pack years: 21.50    Types: Cigarettes   Smokeless tobacco: Never  Vaping Use   Vaping Use: Never used  Substance Use Topics   Alcohol use: Not Currently    Alcohol/week: 12.0  standard drinks    Types: 12 Standard drinks or equivalent per week    Comment: occassional   Drug use: Not Currently    Types: Cocaine    Comment: hx of crack/cocaine 7yrs ago 10/01/2019- none    Family History  Problem Relation Age of Onset   Heart failure Father    Hypertension Father    Diabetes Brother    Heart attack Brother    Alzheimer's disease Mother    Stroke Sister    Diabetes Sister    Alzheimer's disease Sister    Hypertension Brother    Diabetes Brother    Drug abuse Brother    Colon cancer Neg Hx     Allergies  Allergen Reactions   Augmentin [Amoxicillin-Pot Clavulanate] Diarrhea and Other (See Comments)    Severe diarrhea   Mucinex Fast-Max Other (See Comments)    Intense sweating    Amphetamines Other (See Comments)    Unknown reaction    Health Maintenance  Topic Date Due   FOOT EXAM  12/30/2020   HEMOGLOBIN A1C  08/03/2021   LIPID PANEL  05/13/2022   COLONOSCOPY (Pts 45-53yrs Insurance coverage will need to be confirmed)  10/01/2022   OPHTHALMOLOGY EXAM  10/05/2022   TETANUS/TDAP  06/13/2029   Pneumonia Vaccine 76+ Years old  Completed   INFLUENZA VACCINE  Completed   COVID-19 Vaccine  Completed   Hepatitis C Screening  Completed   Zoster Vaccines- Shingrix  Completed   HPV VACCINES  Aged Out    Objective:  Vitals:   07/08/21 0910  BP: 115/63  Pulse: 73  Temp: (!) 97.5 F (36.4 C)  TempSrc: Oral  SpO2: 98%  Weight: 171 lb (77.6 kg)   Body mass index is 23.52 kg/m.  Physical Exam Constitutional:      Comments: Jacob [name] is pleasant and in no distress.  He has seemed more distractible at recent visits and frequently requires me to redirect him to what we were talking about.  Cardiovascular:     Rate and Rhythm: Normal rate.  Pulmonary:     Effort: Pulmonary effort is normal.  Abdominal:     Palpations: Abdomen is soft.     Tenderness: There is no abdominal tenderness.    Lab Results Lab Results  Component Value Date   WBC 4.0  10/07/2021   HGB 14.4 10/07/2021   HCT 45 10/07/2021   MCV 97.3 07/08/2021   PLT 150 10/07/2021    Lab Results  Component Value Date   CREATININE 9.3 (A) 10/07/2021   BUN 88 (A) 10/07/2021   NA 135 (A) 10/07/2021   K 5.0 10/07/2021   CL 94 (A) 10/07/2021   CO2 27 07/08/2021    Lab Results  Component Value Date   ALT 18 06/23/2021   AST 22 06/23/2021   ALKPHOS 60 06/23/2021   BILITOT 0.4 06/23/2021    Lab Results  Component Value Date   CHOL 111 05/13/2021   HDL 32 (L) 05/13/2021   LDLCALC 58 05/13/2021   TRIG 111 05/13/2021   CHOLHDL 3.5 05/13/2021   Lab Results  Component Value Date   LABRPR REACTIVE (A) 06/30/2020   RPRTITER 1:1 (H) 06/30/2020   HIV 1 RNA Quant  Date Value  04/06/2021 <20 Copies/mL (H)  06/30/2020 <  20 Copies/mL  06/24/2019 <20 NOT DETECTED copies/mL   CD4 T Cell Abs (/uL)  Date Value  04/06/2021 406  06/30/2020 403  06/24/2019 927     Problem List Items Addressed This Visit       High   Helicobacter pylori (H. pylori) infection    I reviewed treatment options with our ID pharmacist, Daisy Lazar, again today and we do not find any contraindication to using the components of Pylera (bismuth, metronidazole and tetracycline) in patients who are on hemodialysis.  I also discussed the situation with his nephrologist, Dr. Pearson Grippe and he is in agreement with using this regimen.  Jacob [name] is now willing to try it.  I will have him come back next week with his new bottle of Pylera and a refill of his Protonix so that he can review how to take the regimen with Estill Bamberg.  I will see him back in 3 months for repeat breath test.      Relevant Medications   bismuth-metronidazole-tetracycline (PYLERA) 140-125-125 MG capsule   Other Visit Diagnoses     Gastroesophageal reflux disease without esophagitis       Relevant Medications   pantoprazole (PROTONIX) 40 MG tablet   bismuth-metronidazole-tetracycline (PYLERA) [id number] MG capsule          [name], MD [facility] for Infectious [place] [phone] pager   [phone] cell 10/26/2021, 5:10 PM

## 2021-07-08 NOTE — ED Provider Notes (Signed)
Emergency Medicine Provider Triage Evaluation Note  Jacob [name] , a 70 y.o. male  was evaluated in triage.  Pt complains of generalized weakness, sob. Felt like his blood counts were low.  Review of Systems  Positive: Weakness, sob, abd swelling Negative: chest pain  Physical Exam  There were no vitals taken for this visit. Gen:   Awake, no distress   Resp:  Normal effort  MSK:   Moves extremities without difficulty  Other:  Heart with rrr  Medical Decision Making  Medically screening exam initiated at 7:01 PM.  Appropriate orders placed.  Jacob [name] was informed that the remainder of the evaluation will be completed by another provider, this initial triage assessment does not replace that evaluation, and the importance of remaining in the ED until their evaluation is complete.     Bishop Dublin 07/08/21 1919    Daleen Bo, MD 07/10/21 1149

## 2021-07-08 NOTE — Telephone Encounter (Signed)
   Name: TAYSHAUN KROH  DOB: May 05, 1951  MRN: [id number]   Primary Cardiologist: Jenkins Rouge, MD  Chart reviewed as part of pre-operative protocol coverage.  History of CAD s/p CABG x2 in 2004, DES, ischemic cardiomyopathy/chronic combined CHF, ESRD on HD, HIV, hypertension, hyperlipidemia, DM2, depression, GI bleed requiring transfusion, recurrent AVMs, anemia treated with IV iron, SDH, tobacco use, prior cocaine use, RBBB, peripheral neuropathy, gout, GERD.    He was last seen in office 03/02/2021 and stable from a cardiac standpoint.  It was noted he had atypical pain midway down the torso in the left axillary line without acceleration in symptoms and chronic/unchanged dyspnea.  He reported low back pain/hip pain worse with ambulation.  He was admitted 04/2021 with code STEMI activated but discontinued after rounding cardiologist examined the EKG and noted no change from previous.  He was seen 8/31 in the ED for ESRD with recent cold symptoms.  Chest x-ray showed multifocal pneumonia.  Since that day, OLUWATOSIN HIGGINSON has continued to report issues with shortness of breath, cough, and sleep.  Suspect his recent symptoms are 2/2 his recent cold, but given that he has not been doing well with recent admission since his last office visit, recommend preoperative cardiac evaluation in the office prior to proceeding to surgery.  Arvil Chaco, PA-C 07/08/2021, 8:52 AM

## 2021-07-08 NOTE — Telephone Encounter (Signed)
Just saw that patient has an appt scheduled with Dr [name] on 09/21/21 at 8:15 AM, he can either schedule a sooner appt or postpone his procedure until after he is seen.

## 2021-07-08 NOTE — Telephone Encounter (Signed)
Received fax from [facility] stating insurance will not cover Pylera capsule. Insurance is requesting alternative be sent in. Spoke with Shelly Coss Tech who states insurance will cover Amox, Clarithromycin and omeprazole. Confirmed alternative with [facility]. Will forward message to Dr. [name] and pharmacy team.  Jacob [name], Corn

## 2021-07-09 ENCOUNTER — Other Ambulatory Visit: Payer: Self-pay | Admitting: Nurse Practitioner

## 2021-07-09 ENCOUNTER — Other Ambulatory Visit: Payer: Self-pay | Admitting: Pharmacist

## 2021-07-09 ENCOUNTER — Other Ambulatory Visit (HOSPITAL_COMMUNITY): Payer: Self-pay

## 2021-07-09 DIAGNOSIS — A048 Other specified bacterial intestinal infections: Secondary | ICD-10-CM

## 2021-07-09 LAB — TROPONIN I (HIGH SENSITIVITY): Troponin I (High Sensitivity): 30 ng/L — ABNORMAL HIGH (ref <18)

## 2021-07-09 LAB — POC OCCULT BLOOD, ED: Fecal Occult Bld: NEGATIVE

## 2021-07-09 MED ORDER — METRONIDAZOLE 500 MG PO TABS
500.0000 mg | ORAL_TABLET | Freq: Four times a day (QID) | ORAL | 0 refills | Status: AC
Start: 2021-07-09 — End: 2021-07-23

## 2021-07-09 MED ORDER — TETRACYCLINE HCL 500 MG PO CAPS: 500.0000 mg | ORAL_CAPSULE | Freq: Four times a day (QID) | ORAL | 0 refills | Status: AC

## 2021-07-09 MED ORDER — BISMUTH SUBSALICYLATE 262 MG PO TABS
524.0000 mg | ORAL_TABLET | Freq: Four times a day (QID) | ORAL | 0 refills | Status: DC
Start: 2021-07-09 — End: 2021-07-15

## 2021-07-09 NOTE — ED Notes (Signed)
Pt refused taxi voucher to get to dialysis stating that he has a ride

## 2021-07-09 NOTE — Telephone Encounter (Signed)
Spoke to pt regarding needing a sooner appointment for his surgical procedure in order to get clearance, per pt he has decided to not go through with this procedure and we can cancel the surgical clearance request.

## 2021-07-09 NOTE — Telephone Encounter (Signed)
Got it. I'll work with Jacob [name] to make sure we get his regimen straight before patient sees her on 9/22.

## 2021-07-09 NOTE — Telephone Encounter (Signed)
Patient called to follow up on plan. RN advised that Dr. [name] is out of the office and our pharmacy team is working on alternatives. Patient requesting contact information for his insurance company. RN advised that their contact number is on the insurance card. Reiterated that the team is working on other options and once those are pursued insurance will be re-contacted to initiate a claim.   Jacob [name] Lorita Officer, RN

## 2021-07-09 NOTE — Discharge Instructions (Addendum)
Go to your dialysis as previously scheduled.  Follow-up with your primary doctor in the next week.

## 2021-07-09 NOTE — Telephone Encounter (Signed)
Will send in scripts today and review them with Ray next week. Thanks!

## 2021-07-09 NOTE — ED Notes (Signed)
PT refusing to wear gown at this time

## 2021-07-09 NOTE — ED Notes (Signed)
Patient verbalizes understanding of discharge instructions. Opportunity for questioning and answers were provided. Armband removed by staff, pt discharged from ED via wheelchair.  

## 2021-07-09 NOTE — ED Provider Notes (Signed)
[facility] EMERGENCY DEPARTMENT Provider Note   CSN: [id number] Arrival date & time: 07/08/21  1900     History Chief Complaint  Patient presents with   Weakness   Shortness of Breath    Jacob [name] is a 70 y.o. male.  Patient is a 70 year old male with past medical history of coronary artery disease status post CABG, end-stage renal disease on hemodialysis, recent pneumonia, and angiodysplastic lesions of the intestine causing GI bleeding.  Patient presents today with complaints of weakness.  He was dialyzed Wednesday uneventfully.  Since then he has had progressive weakness and fatigue.  This evening he began feeling short of breath and presents for evaluation of this.  He denies any fevers or chills.  He denies any productive cough.  Patient's nephrologist is Dr. Carmina Miller.  Patient is due for dialysis this morning.  The history is provided by the patient.  Weakness Severity:  Moderate Onset quality:  Gradual Duration:  3 days Timing:  Constant Progression:  Worsening Chronicity:  New Relieved by:  Nothing Worsened by:  Nothing Associated symptoms: shortness of breath   Shortness of Breath     Past Medical History:  Diagnosis Date   Acute respiratory failure ([place]) 03/01/2018   Anemia    Arthritis    "all over; mostly knees and back" (02/28/2018)   Chronic combined systolic and diastolic CHF (congestive heart failure) (HCC)    Chronic lower back pain    stenosis   Community acquired pneumonia 09/06/2013   COPD (chronic obstructive pulmonary disease) ([place])    Coronary atherosclerosis of native coronary artery    a. 02/2003 s/p CABG x 2 (VG->RI, VG->RPDA; b. 11/2019 PCI: LM nl, LAD 90d, D3 50, RI 100, LCX 100p, OM3 100 - fills via L->L collats from D2/dLAD, RCA 100p, VG->RPDA ok, VG->RI 95 (3.5x48 Synergy XD DES).   Drug abuse ([place])    hx; tested for cocaine as recently as 2/08. says he is not using drugs now - avoided defib. for this reason    ESRD  (end stage renal disease) ([place])    Hemo M-W-F- [facility]   Fall at home 10/2020   GERD (gastroesophageal reflux disease)    takes OTC meds as needed   GI bleeding    a. 11/2019 EGD: angiodysplastic lesions w/ bleeding s/p argon plasma/clipping/epi inj. Multiple admissions for the same.   Glaucoma    uses eye drops daily   Hepatitis B 1968   "tx'd w/isolation; caught it from toilet stools in gym"   History of blood transfusion 03/01/2019   History of colon polyps    benign   History of gout    takes Allopurinol daily as well as Colchicine-if needed (02/28/2018)   History of kidney stones    HTN (hypertension)    takes Coreg,Imdur.and Apresoline daily   Human immunodeficiency virus (HIV) disease ([place]) dx'd 1995   on [place] as of 12/2020.     Hyperlipidemia    Ischemic cardiomyopathy    a. 01/2019 Echo: EF 40-45%, diffuse HK, mild basal septal hypertrophy. Diast dysfxn. Nl RV size/fxn. Sev dil LA. Triv MR/TR/PR.   Muscle spasm    takes Zanaflex as needed   Myocardial infarction ([place]) ~ 2004/2005   Nocturia    Peripheral neuropathy    takes gabapentin daily   Pneumonia    "at least twice" (02/28/2018)   SDH (subdural hematoma) (HCC)    Syphilis, unspecified    Type II diabetes mellitus ([place]) 2004  Lantus daily.Average fasting blood sugar 125-199   Wears glasses    Wears partial dentures     Patient Active Problem List   Diagnosis Date Noted   Thrombocytopenia ([place])    Hypercalcemia 16/07/9603   Helicobacter pylori (H. pylori) infection 04/06/2021   Lumbar foraminal stenosis 03/30/2021   Gastric hemorrhage due to angiodysplasia of stomach    GI bleed 11/24/2020   COVID-19 virus infection 11/24/2020   Other mechanical complication of cranial or spinal infusion catheter, sequela 11/11/2020   Subdural hematoma ([place]) 11/06/2020   Fall at home, initial encounter 11/05/2020   Nicotine dependence, cigarettes, uncomplicated 54/06/8118   Alcohol abuse 11/05/2020   Unspecified  trochanteric fracture of left femur, initial encounter for closed fracture ([place]) 11/05/2020   Traumatic subdural hematoma, initial encounter ([place]) 11/05/2020   Fracture of metatarsal of right foot, closed 11/05/2020   Nondisplaced fracture of greater trochanter of left femur, initial encounter for closed fracture ([place])    Uremia    Angiodysplasia of intestine with hemorrhage    Pruritus, unspecified 10/02/2020   Iron deficiency anemia    Acute blood loss anemia    Acute on chronic anemia    Abnormal nuclear stress test 12/19/2019   Type 2 diabetes mellitus with diabetic neuropathy, unspecified ([place]) 08/07/2019   Allergy, unspecified, initial encounter 14/78/2956   Complication of vascular dialysis catheter 08/02/2019   Drug abuse counseling and surveillance of drug abuser 08/02/2019   Other specified coagulation defects ([place]) 08/02/2019   Secondary hyperparathyroidism of renal origin ([place]) 08/02/2019   Unspecified atrial fibrillation ([place]) 08/02/2019   ESRD on hemodialysis ([place])    Cocaine abuse ([place])    CHF exacerbation ([place]) 07/21/2019   Diabetic foot ulcer ([place]) 02/11/2019   AVM (arteriovenous malformation)    Melena 02/07/2019   Symptomatic anemia 02/06/2019   Hyperlipidemia associated with type 2 diabetes mellitus ([place]) 05/30/2018   Right foot ulcer ([place]) 02/28/2018   Chronic obstructive pulmonary disease ([place]) 02/28/2018   Anemia of chronic disease 11/20/2016   CHF (congestive heart failure) ([place]) 11/10/2016   History of lumbar laminectomy for spinal cord decompression 02/29/2016   Type 2 diabetes mellitus with diabetic polyneuropathy, with long-term current use of insulin ([place]) 06/17/2015   HTN (hypertension) 04/26/2015   DM (diabetes mellitus) ([place]) 04/26/2015   Ischemic cardiomyopathy 05/12/2014   Shock ([name]) 09/06/2013   Bunion of left foot 11/25/2011   Bunion, right foot 11/25/2011   Polysubstance abuse ([place]) 07/28/2011   Hip fracture, left ([place]) 04/04/2011   Closed  fracture of neck of femur ([place]) 04/04/2011   Insomnia 02/18/2011   Coronary artery disease involving native heart without angina pectoris 04/20/2009   Chronic combined systolic (congestive) and diastolic (congestive) heart failure ([place]) 04/20/2009   Mixed hyperlipidemia 11/20/2006   Gout 11/20/2006   TOBACCO ABUSE 11/20/2006   Essential hypertension 11/20/2006   Human immunodeficiency virus (HIV) disease ([place]) 09/02/2006    Past Surgical History:  Procedure Laterality Date   AV FISTULA PLACEMENT Left 08/02/2018   Procedure: ARTERIOVENOUS (AV) FISTULA CREATION  left arm radiocephlic;  Surgeon: Marty Heck, MD;  Location: [facility] OR;  Service: Vascular;  Laterality: Left;   AV FISTULA PLACEMENT Left 08/01/2019   Procedure: LEFT BRACHIOCEPHALIC ARTERIOVENOUS (AV) FISTULA CREATION;  Surgeon: [name], MD;  Location: [facility] OR;  Service: Vascular;  Laterality: Left;   BASCILIC VEIN TRANSPOSITION Left 10/03/2019   Procedure: BASILIC VEIN TRANSPOSITION LEFT SECOND STAGE;  Surgeon: [name], MD;  Location: [place];  Service:  Vascular;  Laterality: Left;   BIOPSY  01/25/2021   Procedure: BIOPSY;  Surgeon: Doran Stabler, MD;  Location: [facility] ENDOSCOPY;  Service: Gastroenterology;;   CARDIAC CATHETERIZATION  10/2002; 12/19/2004   [name] 03/08/2011   COLONOSCOPY  2013   [place]    CORONARY ARTERY BYPASS GRAFT  02/24/2003   CABG X2/notes 03/08/2011   CORONARY STENT INTERVENTION N/A 12/19/2019   Procedure: CORONARY STENT INTERVENTION;  Surgeon: Jettie Booze, MD;  Location: [place] CV LAB;  Service: Cardiovascular;  Laterality: N/A;   ENTEROSCOPY N/A 01/25/2021   Procedure: ENTEROSCOPY;  Surgeon: Doran Stabler, MD;  Location: [place];  Service: Gastroenterology;  Laterality: N/A;   ENTEROSCOPY N/A 02/13/2021   Procedure: ENTEROSCOPY;  Surgeon: Jackquline Denmark, MD;  Location: [facility] ENDOSCOPY;  Service: Endoscopy;  Laterality: N/A;   ENTEROSCOPY N/A 05/07/2021   Procedure:  ENTEROSCOPY;  Surgeon: Yetta Flock, MD;  Location: [facility] ENDOSCOPY;  Service: Gastroenterology;  Laterality: N/A;   ESOPHAGOGASTRODUODENOSCOPY (EGD) WITH PROPOFOL N/A 02/08/2019   Procedure: ESOPHAGOGASTRODUODENOSCOPY (EGD) WITH PROPOFOL;  Surgeon: Milus Banister, MD;  Location: [place];  Service: Gastroenterology;  Laterality: N/A;   ESOPHAGOGASTRODUODENOSCOPY (EGD) WITH PROPOFOL N/A 12/22/2019   Procedure: ESOPHAGOGASTRODUODENOSCOPY (EGD) WITH PROPOFOL;  Surgeon: Lavena Bullion, DO;  Location: [place];  Service: Gastroenterology;  Laterality: N/A;   ESOPHAGOGASTRODUODENOSCOPY (EGD) WITH PROPOFOL N/A 10/19/2020   Procedure: ESOPHAGOGASTRODUODENOSCOPY (EGD) WITH PROPOFOL;  Surgeon: Jackquline Denmark, MD;  Location: [facility] ENDOSCOPY;  Service: Endoscopy;  Laterality: N/A;   ESOPHAGOGASTRODUODENOSCOPY (EGD) WITH PROPOFOL N/A 12/22/2020   Procedure: ESOPHAGOGASTRODUODENOSCOPY (EGD) WITH PROPOFOL;  Surgeon: Gatha Mayer, MD;  Location: [place];  Service: Endoscopy;  Laterality: N/A;   ESOPHAGOGASTRODUODENOSCOPY (EGD) WITH PROPOFOL N/A 01/09/2021   Procedure: ESOPHAGOGASTRODUODENOSCOPY (EGD) WITH PROPOFOL;  Surgeon: Irene Shipper, MD;  Location: [facility] ENDOSCOPY;  Service: Endoscopy;  Laterality: N/A;   HEMOSTASIS CLIP PLACEMENT  12/22/2019   Procedure: HEMOSTASIS CLIP PLACEMENT;  Surgeon: Lavena Bullion, DO;  Location: [place] ENDOSCOPY;  Service: Gastroenterology;;   HEMOSTASIS CLIP PLACEMENT  12/22/2020   Procedure: HEMOSTASIS CLIP PLACEMENT;  Surgeon: Gatha Mayer, MD;  Location: [facility] ENDOSCOPY;  Service: Endoscopy;;   HEMOSTASIS CONTROL  12/22/2020   Procedure: HEMOSTASIS CONTROL/hemospray;  Surgeon: Gatha Mayer, MD;  Location: [place];  Service: Endoscopy;;   HOT HEMOSTASIS N/A 02/08/2019   Procedure: HOT HEMOSTASIS (ARGON PLASMA COAGULATION/BICAP);  Surgeon: Milus Banister, MD;  Location: [facility] ENDOSCOPY;  Service: Gastroenterology;  Laterality: N/A;   HOT HEMOSTASIS N/A 12/22/2019    Procedure: HOT HEMOSTASIS (ARGON PLASMA COAGULATION/BICAP);  Surgeon: Lavena Bullion, DO;  Location: [facility] ENDOSCOPY;  Service: Gastroenterology;  Laterality: N/A;   HOT HEMOSTASIS N/A 10/19/2020   Procedure: HOT HEMOSTASIS (ARGON PLASMA COAGULATION/BICAP);  Surgeon: Jackquline Denmark, MD;  Location: [facility] ENDOSCOPY;  Service: Endoscopy;  Laterality: N/A;   HOT HEMOSTASIS N/A 12/22/2020   Procedure: HOT HEMOSTASIS (ARGON PLASMA COAGULATION/BICAP);  Surgeon: Gatha Mayer, MD;  Location: [facility] ENDOSCOPY;  Service: Endoscopy;  Laterality: N/A;   HOT HEMOSTASIS N/A 01/09/2021   Procedure: HOT HEMOSTASIS (ARGON PLASMA COAGULATION/BICAP);  Surgeon: Irene Shipper, MD;  Location: [facility] ENDOSCOPY;  Service: Endoscopy;  Laterality: N/A;   HOT HEMOSTASIS N/A 01/25/2021   Procedure: HOT HEMOSTASIS (ARGON PLASMA COAGULATION/BICAP);  Surgeon: Doran Stabler, MD;  Location: [place];  Service: Gastroenterology;  Laterality: N/A;   HOT HEMOSTASIS N/A 02/13/2021   Procedure: HOT HEMOSTASIS (ARGON PLASMA COAGULATION/BICAP);  Surgeon: Jackquline Denmark, MD;  Location:  [place] ENDOSCOPY;  Service: Endoscopy;  Laterality: N/A;   HOT HEMOSTASIS N/A 05/07/2021   Procedure: HOT HEMOSTASIS (ARGON PLASMA COAGULATION/BICAP);  Surgeon: Yetta Flock, MD;  Location: [facility] ENDOSCOPY;  Service: Gastroenterology;  Laterality: N/A;   INTERTROCHANTERIC HIP FRACTURE SURGERY Left 11/2006   [name] 03/08/2011   INTRAVASCULAR ULTRASOUND/IVUS N/A 12/19/2019   Procedure: Intravascular Ultrasound/IVUS;  Surgeon: Jettie Booze, MD;  Location: [place] CV LAB;  Service: Cardiovascular;  Laterality: N/A;   IR FLUORO GUIDE CV LINE RIGHT  07/24/2019   IR FLUORO GUIDE CV LINE RIGHT  07/30/2019   IR US GUIDE VASC ACCESS RIGHT  07/24/2019   IR US GUIDE VASC ACCESS RIGHT  07/30/2019   LAPAROSCOPIC CHOLECYSTECTOMY  05/2006   LIGATION OF COMPETING BRANCHES OF ARTERIOVENOUS FISTULA Left 11/05/2018   Procedure: LIGATION OF COMPETING BRANCHES OF  ARTERIOVENOUS FISTULA  LEFT  ARM;  Surgeon: Marty Heck, MD;  Location: [facility] OR;  Service: Vascular;  Laterality: Left;   LUMBAR LAMINECTOMY/DECOMPRESSION MICRODISCECTOMY N/A 02/29/2016   Procedure: Left L4-5 Lateral Recess Decompression, Removal Extradural Intraspinal Facet Cyst;  Surgeon: Marybelle Killings, MD;  Location: [place];  Service: Orthopedics;  Laterality: N/A;   MULTIPLE TOOTH EXTRACTIONS     ORIF MANDIBULAR FRACTURE Left 08/13/2004   ORIF of left body fracture mandible with KLS Martin 2.3-mm six hole/notes 03/08/2011   RIGHT/LEFT HEART CATH AND CORONARY/GRAFT ANGIOGRAPHY N/A 12/19/2019   Procedure: RIGHT/LEFT HEART CATH AND CORONARY/GRAFT ANGIOGRAPHY;  Surgeon: Jettie Booze, MD;  Location: [place] CV LAB;  Service: Cardiovascular;  Laterality: N/A;   SCLEROTHERAPY  12/22/2019   Procedure: SCLEROTHERAPY;  Surgeon: Lavena Bullion, DO;  Location: [facility] ENDOSCOPY;  Service: Gastroenterology;;   Clide Deutscher  02/13/2021   Procedure: Clide Deutscher;  Surgeon: Jackquline Denmark, MD;  Location: [facility] ENDOSCOPY;  Service: Endoscopy;;       Family History  Problem Relation Age of Onset   Heart failure Father    Hypertension Father    Diabetes Brother    Heart attack Brother    Alzheimer's disease Mother    Stroke Sister    Diabetes Sister    Alzheimer's disease Sister    Hypertension Brother    Diabetes Brother    Drug abuse Brother    Colon cancer Neg Hx     Social History   Tobacco Use   Smoking status: Every Day    Packs/day: 0.50    Years: 43.00    Pack years: 21.50    Types: Cigarettes   Smokeless tobacco: Never  Vaping Use   Vaping Use: Never used  Substance Use Topics   Alcohol use: Not Currently    Alcohol/week: 12.0 standard drinks    Types: 12 Standard drinks or equivalent per week    Comment: occassional   Drug use: Not Currently    Types: Cocaine    Comment: hx of crack/cocaine 17yr ago 10/01/2019- none    Home Medications Prior to Admission  medications   Medication Sig Start Date End Date Taking? Authorizing Provider  acetaminophen (TYLENOL) 650 MG CR tablet Take 1,300 mg by mouth 3 (three) times daily.    [provider]  allopurinol (ZYLOPRIM) 100 MG tablet TAKE 1 TABLET(100 MG) BY MOUTH DAILY 01/12/21   [name], Dinah C, NP  ANORO ELLIPTA 62.5-25 MCG/INH AEPB INHALE 1 PUFF BY MOUTH EVERY DAY 12/21/20   ELauree Chandler NP  atorvastatin (LIPITOR) 10 MG tablet Take 1 tablet (10 mg total) by mouth daily. 03/02/21 07/01/21  Dunn,  Dayna N, PA-C  B-D UF III MINI PEN NEEDLES 31G X 5 MM MISC USE FOUR TIMES DAILY 05/20/21   [name], NP  BIKTARVY 50-200-25 MG TABS tablet TAKE 1 TABLET BY MOUTH 1 TIME A DAY. 07/01/21   [name], MD  bismuth-metronidazole-tetracycline [facility]) [phone] MG capsule Take 3 capsules by mouth 4 (four) times daily -  before meals and at bedtime. 07/08/21   [name], MD  Calcium Acetate 667 MG TABS Take [phone] mg by mouth See admin instructions. Take 1,334 mg by mouth three times a day with meals and 667 mg with each snack 01/20/21   [provider]  carvedilol (COREG) 3.125 MG tablet TAKE 1 TABLET(3.125 MG) BY MOUTH TWICE DAILY 04/14/21   [name], MD  cefdinir (OMNICEF) 300 MG capsule 1 tablet by mouth every other day after dialysis 06/22/21   [name], NP  colchicine 0.6 MG tablet TAKE 1 TABLET BY MOUTH AS NEEDED FOR GOUT 04/14/21   [name], Dinah C, NP  diclofenac Sodium (VOLTAREN) 1 % GEL APPLY 2 GRAMS TOPICALLY TO THE AFFECTED AREA THREE TIMES DAILY AS NEEDED FOR PAIN 04/14/21   [name], NP  folic acid (FOLVITE) 1 MG tablet Take 1 tablet (1 mg total) by mouth daily. 01/12/21   [name], Dinah C, NP  gabapentin (NEURONTIN) 300 MG capsule Take 1 capsule (300 mg total) by mouth daily. Dose change due to renal function 12/09/20   [name], NP  glucose blood (ONETOUCH ULTRA) test strip Check blood sugar three times daily. Dx:E11.40 03/02/21    [name], NP  insulin lispro (HUMALOG) 100 UNIT/ML KwikPen INJECT 13 UNITS UNDER THE SKIN THREE TIMES DAILY AS NEEDED FOR HIGH BLOOD SUGAR(ABOVE 150) 05/08/21   Regalado, Belkys A, MD  isosorbide mononitrate (IMDUR) 30 MG 24 hr tablet TAKE 1 TABLET BY MOUTH EVERY DAY 01/12/21   [name], Dinah C, NP  Lancets (ONETOUCH DELICA PLUS CLEXNT70Y) MISC Inject 1 Device as directed 3 (three) times daily. Dx: E11.40 09/18/19   [name], NP  latanoprost (XALATAN) 0.005 % ophthalmic solution Place 1 drop into both eyes at bedtime.    [provider]  lidocaine-prilocaine (EMLA) cream Apply 1 application topically every Monday, Wednesday, and Friday with hemodialysis.    [provider]  Methoxy PEG-Epoetin Beta (MIRCERA IJ) Mircera 05/19/21 05/18/22  [provider]  multivitamin (RENA-VIT) TABS tablet Take 1 tablet by mouth daily.    [provider]  nitroGLYCERIN (NITROSTAT) 0.3 MG SL tablet PLACE 1 TABLET UNDER THE TONGUE AS NEEDED FOR CHEST PAIN 04/14/21   [name], MD  octreotide (SANDOSTATIN LAR) 10 MG injection Inject 10 mg into the muscle every 28 (twenty-eight) days. 07/08/21   Armbruster, Carlota Raspberry, MD  ondansetron (ZOFRAN) 4 MG tablet Take 1 tablet (4 mg total) by mouth every 8 (eight) hours as needed for nausea or vomiting. 06/15/21   [name], Dinah C, NP  oxyCODONE (OXY IR/ROXICODONE) 5 MG immediate release tablet Take 1 tablet (5 mg total) by mouth every 4 (four) hours as needed for severe pain. 06/03/21   Jessy Oto, MD  oxyCODONE-acetaminophen (PERCOCET/ROXICET) 5-325 MG tablet Take 1 tablet by mouth daily as needed for severe pain.    [provider]  pantoprazole (PROTONIX) 40 MG tablet Take 1 tablet (40 mg total) by mouth 2 (two) times daily before a meal. 07/08/21   [name], MD  polyethylene glycol (MIRALAX / GLYCOLAX) 17 g packet  Take 17 g by mouth daily as needed for mild constipation. 11/07/20   Aline August, MD   timolol (TIMOPTIC) 0.5 % ophthalmic solution Place 1 drop into both eyes in the morning. 04/04/19   [provider]  VITAMIN D PO Take by mouth. 05/19/21 05/18/22  [provider]    Allergies    Augmentin [amoxicillin-pot clavulanate], Mucinex fast-max, and Amphetamines  Review of Systems   Review of Systems  Respiratory:  Positive for shortness of breath.   Neurological:  Positive for weakness.  All other systems reviewed and are negative.  Physical Exam Updated Vital Signs BP (!) 136/93   Pulse 66   Temp 98.7 F (37.1 C)   Resp 19   Ht '5\' 11"'  (1.803 m)   Wt 77.6 kg   SpO2 98%   BMI 23.85 kg/m   Physical Exam Vitals and nursing note reviewed.  Constitutional:      General: He is not in acute distress.    Appearance: He is well-developed. He is not diaphoretic.  HENT:     Head: Normocephalic and atraumatic.  Cardiovascular:     Rate and Rhythm: Normal rate and regular rhythm.     Heart sounds: No murmur heard.   No friction rub.  Pulmonary:     Effort: Pulmonary effort is normal. No respiratory distress.     Breath sounds: Normal breath sounds. No wheezing or rales.  Abdominal:     General: Bowel sounds are normal. There is no distension.     Palpations: Abdomen is soft.     Tenderness: There is no abdominal tenderness.     Comments: Rectal examination reveals scant brown stool which is heme-negative.  Musculoskeletal:        General: Normal range of motion.     Cervical back: Normal range of motion and neck supple.     Right lower leg: No tenderness. No edema.     Left lower leg: No tenderness. No edema.  Skin:    General: Skin is warm and dry.  Neurological:     Mental Status: He is alert and oriented to person, place, and time.     Cranial Nerves: No cranial nerve deficit.     Motor: No weakness.     Coordination: Coordination normal.    ED Results / Procedures / Treatments   Labs (all labs ordered are listed, but only abnormal results  are displayed) Labs Reviewed  BASIC METABOLIC PANEL - Abnormal; Notable for the following components:      Result Value   Sodium 131 (*)    Chloride 91 (*)    Glucose, Bld 173 (*)    BUN 51 (*)    Creatinine, Ser 6.74 (*)    GFR, Estimated 8 (*)    All other components within normal limits  CBC - Abnormal; Notable for the following components:   RBC 2.63 (*)    Hemoglobin 7.9 (*)    HCT 25.6 (*)    RDW 17.7 (*)    All other components within normal limits  BRAIN NATRIURETIC PEPTIDE - Abnormal; Notable for the following components:   B Natriuretic Peptide 2,737.5 (*)    All other components within normal limits  TROPONIN I (HIGH SENSITIVITY) - Abnormal; Notable for the following components:   Troponin I (High Sensitivity) 27 (*)    All other components within normal limits  TROPONIN I (HIGH SENSITIVITY) - Abnormal; Notable for the following components:   Troponin I (High Sensitivity) 30 (*)  All other components within normal limits  POC OCCULT BLOOD, ED    EKG ED ECG REPORT   Date: 07/09/2021  Rate: 80  Rhythm: normal sinus rhythm  QRS Axis: normal  Intervals: normal  ST/T Wave abnormalities: nonspecific T wave changes  Conduction Disutrbances:right bundle branch block  Narrative Interpretation:   Old EKG Reviewed: unchanged  I have personally reviewed the EKG tracing and agree with the computerized printout as noted.   Radiology DG Chest 1 View  Result Date: 07/08/2021 CLINICAL DATA:  Dialysis, feeling weak EXAM: CHEST  1 VIEW COMPARISON:  Chest x-ray 06/23/2021, CT chest 10/16/2020 FINDINGS: Cardiomegaly. The heart and mediastinal contours are unchanged. Aortic calcification. Slight prominence of the vasculature. No focal consolidation. Chronic coarsened interstitial markings with no overt pulmonary edema. No pleural effusion. No pneumothorax. No acute osseous abnormality.  Old healed right rib fractures. Sternotomy wires again noted. IMPRESSION: Chronic coarsened  interstitial markings with likely superimposed mild pulmonary edema. Electronically Signed   By: Iven Finn M.D.   On: 07/08/2021 20:13    Procedures Procedures   Medications Ordered in ED Medications - No data to display  ED Course  I have reviewed the triage vital signs and the nursing notes.  Pertinent labs & imaging results that were available during my care of the patient were reviewed by me and considered in my medical decision making (see chart for details).    MDM Rules/Calculators/A&P  Patient is a 70 year old male with past medical history as described in the HPI.  He presents here with complaints of weakness.  Patient initially medically screened in triage and work-up was initiated.  After a prolonged wait time, patient was brought to the exam room and promptly evaluated by myself.  He arrived to the room with stable vital signs and in no distress.  I went through the results of his tests and explained that everything seemed consistent with baseline.  He did have findings of increased fluid on his chest x-ray and BNP was elevated, but no hypoxia, tachypnea, or respiratory distress was noted.  EKG was unchanged and troponin x2 unremarkable.  Patient scheduled for dialysis this morning.  I informed patient that his work-up was reassuring and that he could attend dialysis which would help with fluid removal.  Patient very upset that he waited in the waiting room as long as he did with no food and is now not being admitted.  He is insisting upon staying here for his dialysis as he has no way to get to his dialysis facility this morning.  I discussed the possibility of dialysis here at [facility] with Dr. [name], however she did not feel as though he met emergent dialysis criteria.  She did say that he could come later than his scheduled time and she would make arrangements for him to have the dialysis then.  I planned to consult social work in the morning to see about making arrangements  for transport.  When informed of these plans, he became angry and demanded his discharge paperwork.  Discharge papers were given and patient discharged to home at his request.  Final Clinical Impression(s) / ED Diagnoses Final diagnoses:  Chest pain    Rx / DC Orders ED Discharge Orders     None        Veryl Speak, MD 07/09/21 [phone]

## 2021-07-09 NOTE — Telephone Encounter (Signed)
Tetracycline 500mg  four times daily and metronidazole 500mg  four times daily for 2 weeks are both covered at no charge through his insurance. Looks like patient already has pantoprazole. Bismuth could be picked up OTC. Definitely more pill burden, but at least these are covered. Cece mentioned there is not a PA available through his insurance for Pylera. If ok with increased pill burden, can send new scripts in. Thanks!

## 2021-07-12 ENCOUNTER — Telehealth: Payer: Self-pay | Admitting: Pharmacist

## 2021-07-12 DIAGNOSIS — Z992 Dependence on renal dialysis: Secondary | ICD-10-CM | POA: Diagnosis not present

## 2021-07-12 DIAGNOSIS — D631 Anemia in chronic kidney disease: Secondary | ICD-10-CM | POA: Diagnosis not present

## 2021-07-12 DIAGNOSIS — N186 End stage renal disease: Secondary | ICD-10-CM | POA: Diagnosis not present

## 2021-07-12 DIAGNOSIS — D649 Anemia, unspecified: Secondary | ICD-10-CM | POA: Diagnosis not present

## 2021-07-12 DIAGNOSIS — D509 Iron deficiency anemia, unspecified: Secondary | ICD-10-CM | POA: Diagnosis not present

## 2021-07-12 DIAGNOSIS — N2581 Secondary hyperparathyroidism of renal origin: Secondary | ICD-10-CM | POA: Diagnosis not present

## 2021-07-12 NOTE — Telephone Encounter (Signed)
Patient called stating he has the metronidazole from the pharmacy. He understands he needs to buy the bismuth over the counter. He said his pharmacy ordered the tetracycline which should be in today. Told him not to take any of the new medications until his appointment with me on Thursday to ensure everything is squared away.  Alfonse Spruce, PharmD, CPP Clinical Pharmacist Practitioner Infectious [facility]

## 2021-07-13 ENCOUNTER — Telehealth: Payer: Self-pay

## 2021-07-13 NOTE — Telephone Encounter (Signed)
He will need an office visit for evaluation.

## 2021-07-13 NOTE — Telephone Encounter (Addendum)
Patient feels he is in desperate need of a blood transfusion. He states his hemoglobin is at a 7.4 (lab indicate its 7.9) and was about to pass out walking to the dumpster. He want you to order blood at short stay for him. To Joelene Millin.

## 2021-07-13 NOTE — Telephone Encounter (Signed)
DWP. He declined making another apnt.

## 2021-07-13 NOTE — Telephone Encounter (Signed)
Patient called stating that his hgb was low and wanted to know if Dr.Campbell would be able to refer him to outpatient to get a blood transfusion at his appointment on 9/22. Patient most recent hgb is 7.9. Patient stated that he feels weak. I advised patient that if he feels weak he should go to the ER or contact his PCP to see what they recommend. Patient was offered appointment this morning with PCP and declined. Patient stated that he may as well wait until his dialysis appointment tomorrow and he then ended the call.    [place], CMA

## 2021-07-14 ENCOUNTER — Ambulatory Visit: Payer: Medicare Other | Admitting: Pharmacist

## 2021-07-14 DIAGNOSIS — Z992 Dependence on renal dialysis: Secondary | ICD-10-CM | POA: Diagnosis not present

## 2021-07-14 DIAGNOSIS — D509 Iron deficiency anemia, unspecified: Secondary | ICD-10-CM | POA: Diagnosis not present

## 2021-07-14 DIAGNOSIS — N186 End stage renal disease: Secondary | ICD-10-CM | POA: Diagnosis not present

## 2021-07-14 DIAGNOSIS — D631 Anemia in chronic kidney disease: Secondary | ICD-10-CM | POA: Diagnosis not present

## 2021-07-14 DIAGNOSIS — D649 Anemia, unspecified: Secondary | ICD-10-CM | POA: Diagnosis not present

## 2021-07-14 DIAGNOSIS — N2581 Secondary hyperparathyroidism of renal origin: Secondary | ICD-10-CM | POA: Diagnosis not present

## 2021-07-15 ENCOUNTER — Ambulatory Visit: Payer: Medicare Other | Admitting: Specialist

## 2021-07-15 ENCOUNTER — Ambulatory Visit (INDEPENDENT_AMBULATORY_CARE_PROVIDER_SITE_OTHER): Payer: Medicare Other | Admitting: Pharmacist

## 2021-07-15 ENCOUNTER — Other Ambulatory Visit: Payer: Self-pay

## 2021-07-15 DIAGNOSIS — A048 Other specified bacterial intestinal infections: Secondary | ICD-10-CM | POA: Diagnosis not present

## 2021-07-15 DIAGNOSIS — Z23 Encounter for immunization: Secondary | ICD-10-CM

## 2021-07-15 MED ORDER — BISMUTH SUBSALICYLATE 262 MG PO TABS
524.0000 mg | ORAL_TABLET | Freq: Four times a day (QID) | ORAL | 0 refills | Status: AC
Start: 2021-07-15 — End: 2021-07-29

## 2021-07-15 NOTE — Patient Instructions (Signed)
Please make sure to get your additional bottle of Pepto-Bismol for $7.26 from [facility] with your coupon.  Please follow-up with me on 10/4 to fill your next pill boxes for second week of medicine.  Thank you!  Jacob [name]

## 2021-07-15 NOTE — Progress Notes (Signed)
HPI: Jacob [name] is a 70 y.o. male who presents to the [facility] for H. Pylori follow-up.  Patient Active Problem List   Diagnosis Date Noted   Thrombocytopenia ([place])    Hypercalcemia 76/16/0737   Helicobacter pylori (H. pylori) infection 04/06/2021   Lumbar foraminal stenosis 03/30/2021   Gastric hemorrhage due to angiodysplasia of stomach    GI bleed 11/24/2020   COVID-19 virus infection 11/24/2020   Other mechanical complication of cranial or spinal infusion catheter, sequela 11/11/2020   Subdural hematoma ([place]) 11/06/2020   Fall at home, initial encounter 11/05/2020   Nicotine dependence, cigarettes, uncomplicated 10/62/6948   Alcohol abuse 11/05/2020   Unspecified trochanteric fracture of left femur, initial encounter for closed fracture ([place]) 11/05/2020   Traumatic subdural hematoma, initial encounter ([place]) 11/05/2020   Fracture of metatarsal of right foot, closed 11/05/2020   Nondisplaced fracture of greater trochanter of left femur, initial encounter for closed fracture ([place])    Uremia    Angiodysplasia of intestine with hemorrhage    Pruritus, unspecified 10/02/2020   Iron deficiency anemia    Acute blood loss anemia    Acute on chronic anemia    Abnormal nuclear stress test 12/19/2019   Type 2 diabetes mellitus with diabetic neuropathy, unspecified ([place]) 08/07/2019   Allergy, unspecified, initial encounter 54/62/7035   Complication of vascular dialysis catheter 08/02/2019   Drug abuse counseling and surveillance of drug abuser 08/02/2019   Other specified coagulation defects ([place]) 08/02/2019   Secondary hyperparathyroidism of renal origin ([place]) 08/02/2019   Unspecified atrial fibrillation ([place]) 08/02/2019   ESRD on hemodialysis ([place])    Cocaine abuse ([place])    CHF exacerbation ([place]) 07/21/2019   Diabetic foot ulcer ([place]) 02/11/2019   AVM (arteriovenous malformation)    Melena 02/07/2019   Symptomatic anemia 02/06/2019   Hyperlipidemia associated  with type 2 diabetes mellitus ([place]) 05/30/2018   Right foot ulcer ([place]) 02/28/2018   Chronic obstructive pulmonary disease ([place]) 02/28/2018   Anemia of chronic disease 11/20/2016   CHF (congestive heart failure) ([place]) 11/10/2016   History of lumbar laminectomy for spinal cord decompression 02/29/2016   Type 2 diabetes mellitus with diabetic polyneuropathy, with long-term current use of insulin ([place]) 06/17/2015   HTN (hypertension) 04/26/2015   DM (diabetes mellitus) ([place]) 04/26/2015   Ischemic cardiomyopathy 05/12/2014   Shock ([place]) 09/06/2013   Bunion of left foot 11/25/2011   Bunion, right foot 11/25/2011   Polysubstance abuse ([place]) 07/28/2011   Hip fracture, left ([place]) 04/04/2011   Closed fracture of neck of femur ([employer]) 04/04/2011   Insomnia 02/18/2011   Coronary artery disease involving native heart without angina pectoris 04/20/2009   Chronic combined systolic (congestive) and diastolic (congestive) heart failure ([place]) 04/20/2009   Mixed hyperlipidemia 11/20/2006   Gout 11/20/2006   TOBACCO ABUSE 11/20/2006   Essential hypertension 11/20/2006   Human immunodeficiency virus (HIV) disease ([place]) 09/02/2006    Patient's Medications  New Prescriptions   No medications on file  Previous Medications   ACETAMINOPHEN (TYLENOL) 650 MG CR TABLET    Take 1,300 mg by mouth 3 (three) times daily.   ALLOPURINOL (ZYLOPRIM) 100 MG TABLET    TAKE 1 TABLET(100 MG) BY MOUTH DAILY   ANORO ELLIPTA 62.5-25 MCG/INH AEPB    INHALE 1 PUFF BY MOUTH EVERY DAY   ATORVASTATIN (LIPITOR) 10 MG TABLET    Take 1 tablet (10 mg total) by mouth daily.   B-D UF III MINI PEN NEEDLES 31G X 5 MM MISC  USE FOUR TIMES DAILY   BIKTARVY 50-200-25 MG TABS TABLET    TAKE 1 TABLET BY MOUTH 1 TIME A DAY.   BISMUTH-METRONIDAZOLE-TETRACYCLINE (PYLERA) 140-125-125 MG CAPSULE    Take 3 capsules by mouth 4 (four) times daily -  before meals and at bedtime.   CALCIUM ACETATE 667 MG TABS    Take 667-1,334 mg by mouth See admin  instructions. Take 1,334 mg by mouth three times a day with meals and 667 mg with each snack   CARVEDILOL (COREG) 3.125 MG TABLET    TAKE 1 TABLET(3.125 MG) BY MOUTH TWICE DAILY   CEFDINIR (OMNICEF) 300 MG CAPSULE    1 tablet by mouth every other day after dialysis   COLCHICINE 0.6 MG TABLET    TAKE 1 TABLET BY MOUTH AS NEEDED FOR GOUT   DICLOFENAC SODIUM (VOLTAREN) 1 % GEL    APPLY 2 GRAMS TOPICALLY TO THE AFFECTED AREA THREE TIMES DAILY AS NEEDED FOR PAIN   FOLIC ACID (FOLVITE) 1 MG TABLET    Take 1 tablet (1 mg total) by mouth daily.   GABAPENTIN (NEURONTIN) 300 MG CAPSULE    Take 1 capsule (300 mg total) by mouth daily. Dose change due to renal function   GLUCOSE BLOOD (ONETOUCH ULTRA) TEST STRIP    Check blood sugar three times daily. Dx:E11.40   INSULIN LISPRO (HUMALOG) 100 UNIT/ML KWIKPEN    INJECT 13 UNITS UNDER THE SKIN THREE TIMES DAILY AS NEEDED FOR HIGH BLOOD SUGAR(ABOVE 150)   ISOSORBIDE MONONITRATE (IMDUR) 30 MG 24 HR TABLET    TAKE 1 TABLET BY MOUTH EVERY DAY   LANCETS (ONETOUCH DELICA PLUS [id number]) MISC    1 Device by Other route 3 (three) times daily. E11.42   LATANOPROST (XALATAN) 0.005 % OPHTHALMIC SOLUTION    Place 1 drop into both eyes at bedtime.   LIDOCAINE-PRILOCAINE (EMLA) CREAM    Apply 1 application topically every Monday, Wednesday, and Friday with hemodialysis.   METHOXY PEG-EPOETIN BETA (MIRCERA IJ)    Mircera   METRONIDAZOLE (FLAGYL) 500 MG TABLET    Take 1 tablet (500 mg total) by mouth 4 (four) times daily for 14 days.   MULTIVITAMIN (RENA-VIT) TABS TABLET    Take 1 tablet by mouth daily.   NITROGLYCERIN (NITROSTAT) 0.3 MG SL TABLET    PLACE 1 TABLET UNDER THE TONGUE AS NEEDED FOR CHEST PAIN   OCTREOTIDE (SANDOSTATIN LAR) 10 MG INJECTION    Inject 10 mg into the muscle every 28 (twenty-eight) days.   ONDANSETRON (ZOFRAN) 4 MG TABLET    Take 1 tablet (4 mg total) by mouth every 8 (eight) hours as needed for nausea or vomiting.   OXYCODONE (OXY IR/ROXICODONE) 5 MG  IMMEDIATE RELEASE TABLET    Take 1 tablet (5 mg total) by mouth every 4 (four) hours as needed for severe pain.   OXYCODONE-ACETAMINOPHEN (PERCOCET/ROXICET) 5-325 MG TABLET    Take 1 tablet by mouth daily as needed for severe pain.   PANTOPRAZOLE (PROTONIX) 40 MG TABLET    Take 1 tablet (40 mg total) by mouth 2 (two) times daily before a meal.   POLYETHYLENE GLYCOL (MIRALAX / GLYCOLAX) 17 G PACKET    Take 17 g by mouth daily as needed for mild constipation.   TETRACYCLINE (SUMYCIN) 500 MG CAPSULE    Take 1 capsule (500 mg total) by mouth 4 (four) times daily for 14 days.   TIMOLOL (TIMOPTIC) 0.5 % OPHTHALMIC SOLUTION    Place 1 drop into both eyes  in the morning.   VITAMIN D PO    Take by mouth.  Modified Medications   Modified Medication Previous Medication   BISMUTH SUBSALICYLATE 295 MG TABS Bismuth Subsalicylate 284 MG TABS      Take 2 tablets (524 mg total) by mouth every 6 (six) hours for 14 days.    Take 2 tablets (524 mg total) by mouth every 6 (six) hours for 14 days.  Discontinued Medications   No medications on file    Allergies: Allergies  Allergen Reactions   Augmentin [Amoxicillin-Pot Clavulanate] Diarrhea and Other (See Comments)    Severe diarrhea   Mucinex Fast-Max Other (See Comments)    Intense sweating    Amphetamines Other (See Comments)    Unknown reaction    Past Medical History: Past Medical History:  Diagnosis Date   Acute respiratory failure ([place]) 03/01/2018   Anemia    Arthritis    "all over; mostly knees and back" (02/28/2018)   Chronic combined systolic and diastolic CHF (congestive heart failure) ([place])    Chronic lower back pain    stenosis   Community acquired pneumonia 09/06/2013   COPD (chronic obstructive pulmonary disease) ([place])    Coronary atherosclerosis of native coronary artery    a. 02/2003 s/p CABG x 2 (VG->RI, VG->RPDA; b. 11/2019 PCI: LM nl, LAD 90d, D3 50, RI 100, LCX 100p, OM3 100 - fills via L->L collats from D2/dLAD, RCA 100p, VG->RPDA  ok, VG->RI 95 (3.5x48 Synergy XD DES).   Drug abuse ([place])    hx; tested for cocaine as recently as 2/08. says he is not using drugs now - avoided defib. for this reason    ESRD (end stage renal disease) ([place])    Hemo M-W-F- [facility]   Fall at home 10/2020   GERD (gastroesophageal reflux disease)    takes OTC meds as needed   GI bleeding    a. 11/2019 EGD: angiodysplastic lesions w/ bleeding s/p argon plasma/clipping/epi inj. Multiple admissions for the same.   Glaucoma    uses eye drops daily   Hepatitis B 1968   "tx'd w/isolation; caught it from toilet stools in gym"   History of blood transfusion 03/01/2019   History of colon polyps    benign   History of gout    takes Allopurinol daily as well as Colchicine-if needed (02/28/2018)   History of kidney stones    HTN (hypertension)    takes Coreg,Imdur.and Apresoline daily   Human immunodeficiency virus (HIV) disease ([place]) dx'd 1995   on [place] as of 12/2020.     Hyperlipidemia    Ischemic cardiomyopathy    a. 01/2019 Echo: EF 40-45%, diffuse HK, mild basal septal hypertrophy. Diast dysfxn. Nl RV size/fxn. Sev dil LA. Triv MR/TR/PR.   Muscle spasm    takes Zanaflex as needed   Myocardial infarction ([place]) ~ 2004/2005   Nocturia    Peripheral neuropathy    takes gabapentin daily   Pneumonia    "at least twice" (02/28/2018)   SDH (subdural hematoma) (H[facility])    Syphilis, unspecified    Type II diabetes mellitus ([place]) 2004   Lantus daily.Average fasting blood sugar 125-199   Wears glasses    Wears partial dentures     Social History: Social History   Socioeconomic History   Marital status: Single    Spouse name: Not on file   Number of children: 1   Years of education: Not on file   Highest education level: Not on file  Occupational History   Occupation: retired  Tobacco Use   Smoking status: Every Day    Packs/day: 0.50    Years: 43.00    Pack years: 21.50    Types: Cigarettes   Smokeless tobacco: Never  Vaping Use    Vaping Use: Never used  Substance and Sexual Activity   Alcohol use: Not Currently    Alcohol/week: 12.0 standard drinks    Types: 12 Standard drinks or equivalent per week    Comment: occassional   Drug use: Not Currently    Types: Cocaine    Comment: hx of crack/cocaine 69yrs ago 10/01/2019- none   Sexual activity: Yes    Partners: Male    Comment: Declined condoms  Other Topics Concern   Not on file  Social History Narrative   Retired.       As of 05/2015:   Diet: No salt    Caffeine   Married: Single    House: Condo, 2 stories, 1 person (self)    Pets: No    Current/Past profession: N/A   Exercise: walks daily    Living Will: Yes    DNR   POA/HPOA: No       Social Determinants of Radio broadcast assistant Strain: Low Risk    Difficulty of Paying Living Expenses: Not hard at all  Food Insecurity: No Food Insecurity   Worried About Charity fundraiser in the Last Year: Never true   Arboriculturist in the Last Year: Never true  Transportation Needs: No Transportation Needs   Lack of Transportation (Medical): No   Lack of Transportation (Non-Medical): No  Physical Activity: Not on file  Stress: Not on file  Social Connections: Not on file    Labs: Lab Results  Component Value Date   HIV1RNAQUANT <20 (H) 04/06/2021   HIV1RNAQUANT <20 06/30/2020   HIV1RNAQUANT <20 NOT DETECTED 06/24/2019   CD4TABS 406 04/06/2021   CD4TABS 403 06/30/2020   CD4TABS 927 06/24/2019    RPR and STI Lab Results  Component Value Date   LABRPR REACTIVE (A) 06/30/2020   LABRPR REACTIVE (A) 06/24/2019   LABRPR REACTIVE (A) 12/12/2017   LABRPR REACTIVE (A) 06/27/2017   LABRPR REACTIVE (A) 12/29/2015   RPRTITER 1:1 (H) 06/30/2020   RPRTITER 1:1 (H) 06/24/2019   RPRTITER 1:1 (H) 12/12/2017   RPRTITER 1:1 06/27/2017   RPRTITER 1:1 12/29/2015    No flowsheet data found.  Hepatitis B Lab Results  Component Value Date   HEPBSAB Reactive (A) 11/06/2020   HEPBSAG NON REACTIVE  06/24/2021   HEPBCAB Reactive (A) 11/06/2020   Hepatitis C No results found for: HEPCAB, HCVRNAPCRQN Hepatitis A Lab Results  Component Value Date   HAV REACTIVE (A) 04/25/2018   Lipids: Lab Results  Component Value Date   CHOL 111 05/13/2021   TRIG 111 05/13/2021   HDL 32 (L) 05/13/2021   CHOLHDL 3.5 05/13/2021   VLDL 19 12/21/2019   LDLCALC 58 05/13/2021    Assessment: Mr. Heidrick presents to clinic today for H. pylori follow-up. He has been following Dr. [name] for H. pylori and has taken different regimens without bismuth due to his nephrologist saying he could not receive it with his severe renal impairment. Now we have all jointly agreed it is appropriate for him to receive bismuth as he is undergoing regular HD on MWF. Regimen will include tetracycline 500mg  QID, bismuth 524mg  (2  tablets) QID, metronidazole 500mg  QID, and pantoprazole 40mg  BID for 14 days.  Filled 2 pillboxes with appropriate time labels of what medicines to take at what times for one week. He did not have enough bismuth tablets to fill the full week, so he will go [facility] on Saturday to pick up 112 tablets for $7 with a goodrx card. He verbalized understanding to fill the boxes without 2 pink tablets (the bismuth) with 2 pink tablets. He will follow-up with me in one week after starting to refill his pillboxes. He knows to bring his medicine and pillboxes in that day.  He will start therapy next Monday because he wants to start fresh and wanted to have some alcohol on Saturday. Counseled to avoid alcohol while taking metronidazole due to the chance of a disulfiram like reaction, and he verbalized understanding.   Patient also due for his bivalent COVID shot, so we administered that today as well.   Plan: Filled pillboxes Administered bivalent COVID today Follow-up on 10/4 at [place], PharmD, CPP Clinical Pharmacist Practitioner Infectious Diseases [place] for  Infectious Disease 07/15/2021, 11:12 AM

## 2021-07-16 DIAGNOSIS — D631 Anemia in chronic kidney disease: Secondary | ICD-10-CM | POA: Diagnosis not present

## 2021-07-16 DIAGNOSIS — D649 Anemia, unspecified: Secondary | ICD-10-CM | POA: Diagnosis not present

## 2021-07-16 DIAGNOSIS — D509 Iron deficiency anemia, unspecified: Secondary | ICD-10-CM | POA: Diagnosis not present

## 2021-07-16 DIAGNOSIS — Z992 Dependence on renal dialysis: Secondary | ICD-10-CM | POA: Diagnosis not present

## 2021-07-16 DIAGNOSIS — N2581 Secondary hyperparathyroidism of renal origin: Secondary | ICD-10-CM | POA: Diagnosis not present

## 2021-07-16 DIAGNOSIS — N186 End stage renal disease: Secondary | ICD-10-CM | POA: Diagnosis not present

## 2021-07-19 ENCOUNTER — Telehealth: Payer: Self-pay | Admitting: Pharmacist

## 2021-07-19 DIAGNOSIS — D509 Iron deficiency anemia, unspecified: Secondary | ICD-10-CM | POA: Diagnosis not present

## 2021-07-19 DIAGNOSIS — N186 End stage renal disease: Secondary | ICD-10-CM | POA: Diagnosis not present

## 2021-07-19 DIAGNOSIS — N2581 Secondary hyperparathyroidism of renal origin: Secondary | ICD-10-CM | POA: Diagnosis not present

## 2021-07-19 DIAGNOSIS — Z992 Dependence on renal dialysis: Secondary | ICD-10-CM | POA: Diagnosis not present

## 2021-07-19 DIAGNOSIS — D649 Anemia, unspecified: Secondary | ICD-10-CM | POA: Diagnosis not present

## 2021-07-19 DIAGNOSIS — D631 Anemia in chronic kidney disease: Secondary | ICD-10-CM | POA: Diagnosis not present

## 2021-07-19 NOTE — Telephone Encounter (Signed)
Jacob [name] called asking about diarrhea. He said he had some gas on Saturday and some diarrhea yesterday. He said he started his regimen on Sunday and has been ok since then. He denied trying any different foods. Encouraged him to take his medicines with at least a small snack or meal to prevent those side effects.   Alfonse Spruce, PharmD, CPP Clinical Pharmacist Practitioner Infectious [place] for Infect[facility] w

## 2021-07-20 ENCOUNTER — Other Ambulatory Visit: Payer: Self-pay

## 2021-07-20 ENCOUNTER — Ambulatory Visit (INDEPENDENT_AMBULATORY_CARE_PROVIDER_SITE_OTHER): Payer: Medicare Other | Admitting: Family Medicine

## 2021-07-20 ENCOUNTER — Encounter: Payer: Self-pay | Admitting: Family Medicine

## 2021-07-20 VITALS — BP 120/70 | HR 85 | Temp 97.5°F | Ht 71.0 in | Wt 170.4 lb

## 2021-07-20 DIAGNOSIS — D649 Anemia, unspecified: Secondary | ICD-10-CM | POA: Diagnosis not present

## 2021-07-20 DIAGNOSIS — Z794 Long term (current) use of insulin: Secondary | ICD-10-CM

## 2021-07-20 DIAGNOSIS — E1142 Type 2 diabetes mellitus with diabetic polyneuropathy: Secondary | ICD-10-CM

## 2021-07-20 DIAGNOSIS — Z23 Encounter for immunization: Secondary | ICD-10-CM

## 2021-07-20 DIAGNOSIS — M48061 Spinal stenosis, lumbar region without neurogenic claudication: Secondary | ICD-10-CM | POA: Diagnosis not present

## 2021-07-20 DIAGNOSIS — I509 Heart failure, unspecified: Secondary | ICD-10-CM | POA: Diagnosis not present

## 2021-07-20 MED ORDER — OXYCODONE-ACETAMINOPHEN 5-325 MG PO TABS: 1.0000 | ORAL_TABLET | ORAL | 0 refills | Status: AC | PRN

## 2021-07-20 NOTE — Progress Notes (Signed)
Provider:  Alain Honey, MD  Careteam: Patient Care Team: [name], NP as PCP - General (Geriatric Medicine) [name], MD as PCP - Infectious Diseases (Infectious Diseases) [name], MD as PCP - Cardiology (Cardiology) [name], MD as Consulting Physician (Ophthalmology) [name], MD as Consulting Physician (Cardiology) [name], MD as Consulting Physician (Infectious Diseases) Gardiner Barefoot, DPM as Consulting Physician (Podiatry) [name], Nelda Bucks, NP as Nurse Practitioner (Family Medicine) Kid[facility], [place]:  [place]  Advanced Directive information    Allergies  Allergen Reactions   Augmentin [Amoxicillin-Pot Clavulanate] Diarrhea and Other (See Comments)    Severe diarrhea   Mucinex Fast-Max Other (See Comments)    Intense sweating    Amphetamines Other (See Comments)    Unknown reaction    Chief Complaint  Patient presents with   Acute Visit    Patient presents today for a ED follow-up. He was at the ED on 9/15-9/16 and diagnosis was weakness, end-stage renal disease on hemodialysis and hypervolemia (unspecified).   Quality Metric Gaps    Foot and eye exam, Flu, covid booster vaccines     HPI: Patient is a 70 y.o. male follow-up visit from ER on 9-15 for weakness.  Labs were consistent with recent numbers but hemoglobin was a little lower than usual.  Was also felt to be hypervolemic.  He did receive injection iron and possibly erythropoietin for the anemia.  Review of Systems:  Review of Systems  Constitutional:  Positive for malaise/fatigue.  Respiratory: Negative.    Cardiovascular: Negative.   Musculoskeletal:  Positive for back pain.  All other systems reviewed and are negative.  Past Medical History:  Diagnosis Date   Acute respiratory failure ([place]) 03/01/2018   Anemia    Arthritis    "all over; mostly knees and back" (02/28/2018)   Chronic combined systolic and diastolic CHF  (congestive heart failure) (HCC)    Chronic lower back pain    stenosis   Community acquired pneumonia 09/06/2013   COPD (chronic obstructive pulmonary disease) ([place])    Coronary atherosclerosis of native coronary artery    a. 02/2003 s/p CABG x 2 (VG->RI, VG->RPDA; b. 11/2019 PCI: LM nl, LAD 90d, D3 50, RI 100, LCX 100p, OM3 100 - fills via L->L collats from D2/dLAD, RCA 100p, VG->RPDA ok, VG->RI 95 (3.5x48 Synergy XD DES).   Drug abuse ([place])    hx; tested for cocaine as recently as 2/08. says he is not using drugs now - avoided defib. for this reason    ESRD (end stage renal disease) ([place])    Hemo M-W-F- [facility]   Fall at home 10/2020   GERD (gastroesophageal reflux disease)    takes OTC meds as needed   GI bleeding    a. 11/2019 EGD: angiodysplastic lesions w/ bleeding s/p argon plasma/clipping/epi inj. Multiple admissions for the same.   Glaucoma    uses eye drops daily   Hepatitis B 1968   "tx'd w/isolation; caught it from toilet stools in gym"   History of blood transfusion 03/01/2019   History of colon polyps    benign   History of gout    takes Allopurinol daily as well as Colchicine-if needed (02/28/2018)   History of kidney stones    HTN (hypertension)    takes Coreg,Imdur.and Apresoline daily   Human immunodeficiency virus (HIV) disease ([place]) dx'd 1995   on [place] as of 12/2020.     Hyperlipidemia  Ischemic cardiomyopathy    a. 01/2019 Echo: EF 40-45%, diffuse HK, mild basal septal hypertrophy. Diast dysfxn. Nl RV size/fxn. Sev dil LA. Triv MR/TR/PR.   Muscle spasm    takes Zanaflex as needed   Myocardial infarction ([place]) ~ 2004/2005   Nocturia    Peripheral neuropathy    takes gabapentin daily   Pneumonia    "at least twice" (02/28/2018)   SDH (subdural hematoma) (HCC)    Syphilis, unspecified    Type II diabetes mellitus ([place]) 2004   Lantus daily.Average fasting blood sugar 125-199   Wears glasses    Wears partial dentures    Past Surgical History:  Procedure  Laterality Date   AV FISTULA PLACEMENT Left 08/02/2018   Procedure: ARTERIOVENOUS (AV) FISTULA CREATION  left arm radiocephlic;  Surgeon: Marty Heck, MD;  Location: [place];  Service: Vascular;  Laterality: Left;   AV FISTULA PLACEMENT Left 08/01/2019   Procedure: LEFT BRACHIOCEPHALIC ARTERIOVENOUS (AV) FISTULA CREATION;  Surgeon: [name], MD;  Location: [place];  Service: Vascular;  Laterality: Left;   [place] Left 10/03/2019   Procedure: BASILIC VEIN TRANSPOSITION LEFT SECOND STAGE;  Surgeon: [name], MD;  Location: [place];  Service: Vascular;  Laterality: Left;   BIOPSY  01/25/2021   Procedure: BIOPSY;  Surgeon: Doran Stabler, MD;  Location: [facility] ENDOSCOPY;  Service: Gastroenterology;;   CARDIAC CATHETERIZATION  10/2002; 12/19/2004   [name] 03/08/2011   COLONOSCOPY  2013   [place]    CORONARY ARTERY BYPASS GRAFT  02/24/2003   CABG X2/notes 03/08/2011   CORONARY STENT INTERVENTION N/A 12/19/2019   Procedure: CORONARY STENT INTERVENTION;  Surgeon: Jettie Booze, MD;  Location: [place] CV LAB;  Service: Cardiovascular;  Laterality: N/A;   ENTEROSCOPY N/A 01/25/2021   Procedure: ENTEROSCOPY;  Surgeon: Doran Stabler, MD;  Location: [place];  Service: Gastroenterology;  Laterality: N/A;   ENTEROSCOPY N/A 02/13/2021   Procedure: ENTEROSCOPY;  Surgeon: Jackquline Denmark, MD;  Location: [facility] ENDOSCOPY;  Service: Endoscopy;  Laterality: N/A;   ENTEROSCOPY N/A 05/07/2021   Procedure: ENTEROSCOPY;  Surgeon: Yetta Flock, MD;  Location: [facility] ENDOSCOPY;  Service: Gastroenterology;  Laterality: N/A;   ESOPHAGOGASTRODUODENOSCOPY (EGD) WITH PROPOFOL N/A 02/08/2019   Procedure: ESOPHAGOGASTRODUODENOSCOPY (EGD) WITH PROPOFOL;  Surgeon: Milus Banister, MD;  Location: [place];  Service: Gastroenterology;  Laterality: N/A;   ESOPHAGOGASTRODUODENOSCOPY (EGD) WITH PROPOFOL N/A 12/22/2019   Procedure: ESOPHAGOGASTRODUODENOSCOPY (EGD) WITH PROPOFOL;   Surgeon: Lavena Bullion, DO;  Location: [place];  Service: Gastroenterology;  Laterality: N/A;   ESOPHAGOGASTRODUODENOSCOPY (EGD) WITH PROPOFOL N/A 10/19/2020   Procedure: ESOPHAGOGASTRODUODENOSCOPY (EGD) WITH PROPOFOL;  Surgeon: Jackquline Denmark, MD;  Location: [facility] ENDOSCOPY;  Service: Endoscopy;  Laterality: N/A;   ESOPHAGOGASTRODUODENOSCOPY (EGD) WITH PROPOFOL N/A 12/22/2020   Procedure: ESOPHAGOGASTRODUODENOSCOPY (EGD) WITH PROPOFOL;  Surgeon: Gatha Mayer, MD;  Location: [place];  Service: Endoscopy;  Laterality: N/A;   ESOPHAGOGASTRODUODENOSCOPY (EGD) WITH PROPOFOL N/A 01/09/2021   Procedure: ESOPHAGOGASTRODUODENOSCOPY (EGD) WITH PROPOFOL;  Surgeon: Irene Shipper, MD;  Location: [facility] ENDOSCOPY;  Service: Endoscopy;  Laterality: N/A;   HEMOSTASIS CLIP PLACEMENT  12/22/2019   Procedure: HEMOSTASIS CLIP PLACEMENT;  Surgeon: Lavena Bullion, DO;  Location: [place] ENDOSCOPY;  Service: Gastroenterology;;   HEMOSTASIS CLIP PLACEMENT  12/22/2020   Procedure: HEMOSTASIS CLIP PLACEMENT;  Surgeon: Gatha Mayer, MD;  Location: [facility] ENDOSCOPY;  Service: Endoscopy;;   HEMOSTASIS CONTROL  12/22/2020   Procedure: HEMOSTASIS CONTROL/hemospray;  Surgeon: Silvano Rusk  E, MD;  Location: [place];  Service: Endoscopy;;   HOT HEMOSTASIS N/A 02/08/2019   Procedure: HOT HEMOSTASIS (ARGON PLASMA COAGULATION/BICAP);  Surgeon: Milus Banister, MD;  Location: [facility] ENDOSCOPY;  Service: Gastroenterology;  Laterality: N/A;   HOT HEMOSTASIS N/A 12/22/2019   Procedure: HOT HEMOSTASIS (ARGON PLASMA COAGULATION/BICAP);  Surgeon: Lavena Bullion, DO;  Location: [facility] ENDOSCOPY;  Service: Gastroenterology;  Laterality: N/A;   HOT HEMOSTASIS N/A 10/19/2020   Procedure: HOT HEMOSTASIS (ARGON PLASMA COAGULATION/BICAP);  Surgeon: Jackquline Denmark, MD;  Location[facility] ENDOSCOPY;  Service: Endoscopy;  Laterality: N/A;   HOT HEMOSTASIS N/A 12/22/2020   Procedure: HOT HEMOSTASIS (ARGON PLASMA COAGULATION/BICAP);  Surgeon: Gatha Mayer, MD;  Location: [facility] ENDOSCOPY;  Service: Endoscopy;  Laterality: N/A;   HOT HEMOSTASIS N/A 01/09/2021   Procedure: HOT HEMOSTASIS (ARGON PLASMA COAGULATION/BICAP);  Surgeon: Irene Shipper, MD;  Location: [facility] ENDOSCOPY;  Service: Endoscopy;  Laterality: N/A;   HOT HEMOSTASIS N/A 01/25/2021   Procedure: HOT HEMOSTASIS (ARGON PLASMA COAGULATION/BICAP);  Surgeon: Doran Stabler, MD;  Location: [place];  Service: Gastroenterology;  Laterality: N/A;   HOT HEMOSTASIS N/A 02/13/2021   Procedure: HOT HEMOSTASIS (ARGON PLASMA COAGULATION/BICAP);  Surgeon: Jackquline Denmark, MD;  Location: [facility] ENDOSCOPY;  Service: Endoscopy;  Laterality: N/A;   HOT HEMOSTASIS N/A 05/07/2021   Procedure: HOT HEMOSTASIS (ARGON PLASMA COAGULATION/BICAP);  Surgeon: Yetta Flock, MD;  Location: [facility] ENDOSCOPY;  Service: Gastroenterology;  Laterality: N/A;   INTERTROCHANTERIC HIP FRACTURE SURGERY Left 11/2006   [name] 03/08/2011   INTRAVASCULAR ULTRASOUND/IVUS N/A 12/19/2019   Procedure: Intravascular Ultrasound/IVUS;  Surgeon: Jettie Booze, MD;  Location: [place] CV LAB;  Service: Cardiovascular;  Laterality: N/A;   IR FLUORO GUIDE CV LINE RIGHT  07/24/2019   IR FLUORO GUIDE CV LINE RIGHT  07/30/2019   IR US GUIDE VASC ACCESS RIGHT  07/24/2019   IR US GUIDE VASC ACCESS RIGHT  07/30/2019   LAPAROSCOPIC CHOLECYSTECTOMY  05/2006   LIGATION OF COMPETING BRANCHES OF ARTERIOVENOUS FISTULA Left 11/05/2018   Procedure: LIGATION OF COMPETING BRANCHES OF ARTERIOVENOUS FISTULA  LEFT  ARM;  Surgeon: Marty Heck, MD;  [facility] OR;  Service: Vascular;  Laterality: Left;   LUMBAR LAMINECTOMY/DECOMPRESSION MICRODISCECTOMY N/A 02/29/2016   Procedure: Left L4-5 Lateral Recess Decompression, Removal Extradural Intraspinal Facet Cyst;  Surgeon: Marybelle Killings, MD;  Location: [place];  Service: Orthopedics;  Laterality: N/A;   MULTIPLE TOOTH EXTRACTIONS     ORIF MANDIBULAR FRACTURE Left 08/13/2004   ORIF of left body fracture  mandible with KLS Martin 2.3-mm six hole/notes 03/08/2011   RIGHT/LEFT HEART CATH AND CORONARY/GRAFT ANGIOGRAPHY N/A 12/19/2019   Procedure: RIGHT/LEFT HEART CATH AND CORONARY/GRAFT ANGIOGRAPHY;  Surgeon: Jettie Booze, MD;  Location: [place] CV LAB;  Service: Cardiovascular;  Laterality: N/A;   SCLEROTHERAPY  12/22/2019   Procedure: SCLEROTHERAPY;  Surgeon: Lavena Bullion, DO;  Location: [place];  Service: Gastroenterology;;   Clide Deutscher  02/13/2021   Procedure: Clide Deutscher;  Surgeon: Jackquline Denmark, MD;  Location[facility] ENDOSCOPY;  Service: Endoscopy;;   Social History:   reports that he has been smoking cigarettes. He has a 21.50 pack-year smoking history. He has never used smokeless tobacco. He reports that he does not currently use alcohol after a past usage of about 12.0 standard drinks per week. He reports that he does not currently use drugs after having used the following drugs: Cocaine.  Family History  Problem Relation Age of Onset   Heart failure Father  Hypertension Father    Diabetes Brother    Heart attack Brother    Alzheimer's disease Mother    Stroke Sister    Diabetes Sister    Alzheimer's disease Sister    Hypertension Brother    Diabetes Brother    Drug abuse Brother    Colon cancer Neg Hx     Medications: Patient's Medications  New Prescriptions   No medications on file  Previous Medications   ACETAMINOPHEN (TYLENOL) 650 MG CR TABLET    Take 1,300 mg by mouth 3 (three) times daily.   ALLOPURINOL (ZYLOPRIM) 100 MG TABLET    TAKE 1 TABLET(100 MG) BY MOUTH DAILY   ANORO ELLIPTA 62.5-25 MCG/INH AEPB    INHALE 1 PUFF BY MOUTH EVERY DAY   ATORVASTATIN (LIPITOR) 10 MG TABLET    Take 1 tablet (10 mg total) by mouth daily.   B-D UF III MINI PEN NEEDLES 31G X 5 MM MISC    USE FOUR TIMES DAILY   BIKTARVY 50-200-25 MG TABS TABLET    TAKE 1 TABLET BY MOUTH 1 TIME A DAY.   BISMUTH SUBSALICYLATE 409 MG TABS    Take 2 tablets (524 mg total) by mouth  every 6 (six) hours for 14 days.   BISMUTH-METRONIDAZOLE-TETRACYCLINE (PYLERA) 140-125-125 MG CAPSULE    Take 3 capsules by mouth 4 (four) times daily -  before meals and at bedtime.   CALCIUM ACETATE 667 MG TABS    Take 667-1,334 mg by mouth See admin instructions. Take 1,334 mg by mouth three times a day with meals and 667 mg with each snack   CARVEDILOL (COREG) 3.125 MG TABLET    TAKE 1 TABLET(3.125 MG) BY MOUTH TWICE DAILY   CEFDINIR (OMNICEF) 300 MG CAPSULE    1 tablet by mouth every other day after dialysis   COLCHICINE 0.6 MG TABLET    TAKE 1 TABLET BY MOUTH AS NEEDED FOR GOUT   DICLOFENAC SODIUM (VOLTAREN) 1 % GEL    APPLY 2 GRAMS TOPICALLY TO THE AFFECTED AREA THREE TIMES DAILY AS NEEDED FOR PAIN   FOLIC ACID (FOLVITE) 1 MG TABLET    Take 1 tablet (1 mg total) by mouth daily.   GABAPENTIN (NEURONTIN) 300 MG CAPSULE    Take 1 capsule (300 mg total) by mouth daily. Dose change due to renal function   GLUCOSE BLOOD (ONETOUCH ULTRA) TEST STRIP    Check blood sugar three times daily. Dx:E11.40   INSULIN LISPRO (HUMALOG) 100 UNIT/ML [id number]    INJECT 13 UNITS UNDER THE SKIN THREE TIMES DAILY AS NEEDED FOR HIGH BLOOD SUGAR(ABOVE 150)   ISOSORBIDE MONONITRATE (IMDUR) 30 MG 24 HR TABLET    TAKE 1 TABLET BY MOUTH EVERY DAY   LANCETS (ONETOUCH DELICA PLUS [id number]) MISC    1 Device by Other route 3 (three) times daily. E11.42   LATANOPROST (XALATAN) 0.005 % OPHTHALMIC SOLUTION    Place 1 drop into both eyes at bedtime.   LIDOCAINE-PRILOCAINE (EMLA) CREAM    Apply 1 application topically every Monday, Wednesday, and Friday with hemodialysis.   METHOXY PEG-EPOETIN BETA (MIRCERA IJ)    Mircera   METRONIDAZOLE (FLAGYL) 500 MG TABLET    Take 1 tablet (500 mg total) by mouth 4 (four) times daily for 14 days.   MULTIVITAMIN (RENA-VIT) TABS TABLET    Take 1 tablet by mouth daily.   NITROGLYCERIN (NITROSTAT) 0.3 MG SL TABLET    PLACE 1 TABLET UNDER THE TONGUE AS NEEDED FOR CHEST PAIN  OCTREOTIDE  (SANDOSTATIN LAR) 10 MG INJECTION    Inject 10 mg into the muscle every 28 (twenty-eight) days.   ONDANSETRON (ZOFRAN) 4 MG TABLET    Take 1 tablet (4 mg total) by mouth every 8 (eight) hours as needed for nausea or vomiting.   OXYCODONE (OXY IR/ROXICODONE) 5 MG IMMEDIATE RELEASE TABLET    Take 1 tablet (5 mg total) by mouth every 4 (four) hours as needed for severe pain.   OXYCODONE-ACETAMINOPHEN (PERCOCET/ROXICET) 5-325 MG TABLET    Take 1 tablet by mouth daily as needed for severe pain.   PANTOPRAZOLE (PROTONIX) 40 MG TABLET    Take 1 tablet (40 mg total) by mouth 2 (two) times daily before a meal.   POLYETHYLENE GLYCOL (MIRALAX / GLYCOLAX) 17 G PACKET    Take 17 g by mouth daily as needed for mild constipation.   TETRACYCLINE (SUMYCIN) 500 MG CAPSULE    Take 1 capsule (500 mg total) by mouth 4 (four) times daily for 14 days.   TIMOLOL (TIMOPTIC) 0.5 % OPHTHALMIC SOLUTION    Place 1 drop into both eyes in the morning.   VITAMIN D PO    Take by mouth.  Modified Medications   No medications on file  Discontinued Medications   No medications on file    Physical Exam:  Vitals:   07/20/21 0947  BP: 120/70  Pulse: 85  Temp: (!) 97.5 F (36.4 C)  SpO2: 97%  Weight: 170 lb 6.4 oz (77.3 kg)  Height: 5\' 11"  (1.803 m)   Body mass index is 23.77 kg/m. Wt Readings from Last 3 Encounters:  07/20/21 170 lb 6.4 oz (77.3 kg)  07/08/21 171 lb (77.6 kg)  07/08/21 171 lb (77.6 kg)    Physical Exam Vitals and nursing note reviewed.  Constitutional:      Appearance: Normal appearance.  Cardiovascular:     Rate and Rhythm: Normal rate and regular rhythm.  Pulmonary:     Effort: Pulmonary effort is normal.     Breath sounds: Normal breath sounds.  Neurological:     General: No focal deficit present.     Mental Status: He is alert and oriented to person, place, and time.  Psychiatric:        Mood and Affect: Mood normal.        Behavior: Behavior normal.    Labs reviewed: Basic  Metabolic Panel: Recent Labs    10/17/20 0139 10/17/20 1401 12/15/20 1105 12/21/20 0834 05/03/21 2135 05/04/21 0734 05/05/21 0308 05/06/21 1056 05/07/21 0406 06/23/21 1452 06/24/21 0800 07/08/21 1929  NA  --    < > 138   < > 136 136   < > 135 134* 135 133* 131*  K  --    < > 4.0   < > 4.4 5.4*   < > 4.0 4.0 6.3* 6.6* 4.3  CL  --    < > 95*   < > 98 100   < > 98 98 96* 98 91*  CO2  --    < > 29   < > 27 25   < > 26 26 22 22 27   GLUCOSE  --    < > 112*   < > 147* 117*   < > 132* 110* 158* 133* 173*  BUN  --    < > 51*   < > 96* 115*   < > 70* 79* 123* 128* 51*  CREATININE 8.61*   < > 5.77*   < >  5.98* 6.96*   < > 6.67* 7.71* 10.85* 11.14* 6.74*  CALCIUM  --    < > 8.7   < > 8.0* 7.8*   < > 7.5* 7.9* 9.0 9.3 9.1  MG  --    < >  --    < > 2.1 2.1  --   --  1.9  --   --   --   PHOS  --    < >  --    < > 4.2 5.3*  --  5.1* 5.9*  --  5.2*  --   TSH 0.748  --  0.49  --   --   --   --   --   --   --   --   --    < > = values in this interval not displayed.   Liver Function Tests: Recent Labs    05/03/21 0046 05/06/21 1056 05/13/21 0844 06/23/21 1452 06/24/21 0800  AST 25  --  17 22  --   ALT 15  --  12 18  --   ALKPHOS 31*  --  63 60  --   BILITOT 0.7  --  0.3 0.4  --   PROT 6.0*  --  7.2 7.7  --   ALBUMIN 2.8*   < > 4.0 2.6* 2.5*   < > = values in this interval not displayed.   Recent Labs    10/16/20 1807 12/21/20 0834 05/02/21 0743  LIPASE 70* 73* 87*   Recent Labs    05/05/21 1708  AMMONIA 49*   CBC: Recent Labs    02/25/21 1317 05/02/21 0743 05/03/21 0046 05/03/21 0101 06/23/21 1452 06/24/21 0800 07/08/21 1929  WBC 6.6   < > 6.5   < > 8.2 7.1 4.7  NEUTROABS 3,967  --  4.7  --  6.0  --   --   HGB 10.4*   < > 7.0*   < > 8.2* 7.7* 7.9*  HCT 33.1*   < > 22.7*   < > 25.7* 24.1* 25.6*  MCV 93.2   < > 92.3   < > 97.3 94.9 97.3  PLT 198   < > 101*   < > 228 227 214   < > = values in this interval not displayed.   Lipid Panel: Recent Labs     12/15/20 1105 05/13/21 0844  CHOL 143 111  HDL 31* 32*  LDLCALC 79 58  TRIG 248* 111  CHOLHDL 4.6 3.5   TSH: Recent Labs    10/17/20 0139 12/15/20 1105  TSH 0.748 0.49   A1C: Lab Results  Component Value Date   HGBA1C 5.8 (H) 05/03/2021     Assessment/Plan  1. Lumbar foraminal stenosis Patient tells me that he needs surgery per orthopedics but he does not think he could go through rehab at skilled nursing given the fact that he is on dialysis.  He does not have anything for pain but it seems like he has a legitimate need.  In the past he has had issues with alcohol and cocaine.  We spent some time talking about guidelines of the drug contract and I think he is willing to abide by those guidelines so we will give him Percocet 02/24/2024 #30 today  2. Acute on chronic anemia Apparently receives erythropoietin at intervals.  Hemoglobin now is low and he feels the effects.  Being monitored by nephrology  3. Congestive heart failure, unspecified HF chronicity, unspecified heart failure type (  [place]) Compensated at this time.  Dialysis is effective at removing excess fluid  4. Type 2 diabetes mellitus with diabetic polyneuropathy, with long-term current use of insulin ([place]) Continues with as needed short acting insulin based on blood sugar..  Most recent A1c is 5.8   Alain Honey, MD [place] [phone]

## 2021-07-21 DIAGNOSIS — N186 End stage renal disease: Secondary | ICD-10-CM | POA: Diagnosis not present

## 2021-07-21 DIAGNOSIS — N2581 Secondary hyperparathyroidism of renal origin: Secondary | ICD-10-CM | POA: Diagnosis not present

## 2021-07-21 DIAGNOSIS — Z992 Dependence on renal dialysis: Secondary | ICD-10-CM | POA: Diagnosis not present

## 2021-07-21 DIAGNOSIS — D649 Anemia, unspecified: Secondary | ICD-10-CM | POA: Diagnosis not present

## 2021-07-21 DIAGNOSIS — D631 Anemia in chronic kidney disease: Secondary | ICD-10-CM | POA: Diagnosis not present

## 2021-07-21 DIAGNOSIS — D509 Iron deficiency anemia, unspecified: Secondary | ICD-10-CM | POA: Diagnosis not present

## 2021-07-22 ENCOUNTER — Other Ambulatory Visit (HOSPITAL_COMMUNITY): Payer: Self-pay

## 2021-07-23 ENCOUNTER — Other Ambulatory Visit (HOSPITAL_COMMUNITY): Payer: Self-pay

## 2021-07-23 DIAGNOSIS — N186 End stage renal disease: Secondary | ICD-10-CM | POA: Diagnosis not present

## 2021-07-23 DIAGNOSIS — N2581 Secondary hyperparathyroidism of renal origin: Secondary | ICD-10-CM | POA: Diagnosis not present

## 2021-07-23 DIAGNOSIS — D649 Anemia, unspecified: Secondary | ICD-10-CM | POA: Diagnosis not present

## 2021-07-23 DIAGNOSIS — Z992 Dependence on renal dialysis: Secondary | ICD-10-CM | POA: Diagnosis not present

## 2021-07-23 DIAGNOSIS — D509 Iron deficiency anemia, unspecified: Secondary | ICD-10-CM | POA: Diagnosis not present

## 2021-07-23 DIAGNOSIS — D631 Anemia in chronic kidney disease: Secondary | ICD-10-CM | POA: Diagnosis not present

## 2021-07-23 DIAGNOSIS — I129 Hypertensive chronic kidney disease with stage 1 through stage 4 chronic kidney disease, or unspecified chronic kidney disease: Secondary | ICD-10-CM | POA: Diagnosis not present

## 2021-07-26 ENCOUNTER — Other Ambulatory Visit (HOSPITAL_COMMUNITY): Payer: Self-pay

## 2021-07-26 DIAGNOSIS — D631 Anemia in chronic kidney disease: Secondary | ICD-10-CM | POA: Diagnosis not present

## 2021-07-26 DIAGNOSIS — N2581 Secondary hyperparathyroidism of renal origin: Secondary | ICD-10-CM | POA: Diagnosis not present

## 2021-07-26 DIAGNOSIS — D649 Anemia, unspecified: Secondary | ICD-10-CM | POA: Diagnosis not present

## 2021-07-26 DIAGNOSIS — E1122 Type 2 diabetes mellitus with diabetic chronic kidney disease: Secondary | ICD-10-CM | POA: Diagnosis not present

## 2021-07-26 DIAGNOSIS — Z992 Dependence on renal dialysis: Secondary | ICD-10-CM | POA: Diagnosis not present

## 2021-07-26 DIAGNOSIS — N186 End stage renal disease: Secondary | ICD-10-CM | POA: Diagnosis not present

## 2021-07-27 ENCOUNTER — Other Ambulatory Visit: Payer: Self-pay

## 2021-07-27 ENCOUNTER — Ambulatory Visit (INDEPENDENT_AMBULATORY_CARE_PROVIDER_SITE_OTHER): Payer: Medicare Other | Admitting: Pharmacist

## 2021-07-27 DIAGNOSIS — A048 Other specified bacterial intestinal infections: Secondary | ICD-10-CM | POA: Diagnosis not present

## 2021-07-27 NOTE — Progress Notes (Signed)
HPI: Jacob [name] is a 70 y.o. male who presents to the [facility] for H. pylori follow-up.  Patient Active Problem List   Diagnosis Date Noted   Thrombocytopenia ([place])    Hypercalcemia 37/62/8315   Helicobacter pylori (H. pylori) infection 04/06/2021   Lumbar foraminal stenosis 03/30/2021   Gastric hemorrhage due to angiodysplasia of stomach    GI bleed 11/24/2020   COVID-19 virus infection 11/24/2020   Other mechanical complication of cranial or spinal infusion catheter, sequela 11/11/2020   Subdural hematoma 11/06/2020   Fall at home, initial encounter 11/05/2020   Nicotine dependence, cigarettes, uncomplicated 17/61/6073   Alcohol abuse 11/05/2020   Unspecified trochanteric fracture of left femur, initial encounter for closed fracture ([place]) 11/05/2020   Traumatic subdural hematoma, initial encounter 11/05/2020   Fracture of metatarsal of right foot, closed 11/05/2020   Nondisplaced fracture of greater trochanter of left femur, initial encounter for closed fracture ([place])    Uremia    Angiodysplasia of intestine with hemorrhage    Pruritus, unspecified 10/02/2020   Iron deficiency anemia    Acute blood loss anemia    Acute on chronic anemia    Abnormal nuclear stress test 12/19/2019   Type 2 diabetes mellitus with diabetic neuropathy, unspecified ([place]) 08/07/2019   Allergy, unspecified, initial encounter 71/03/2693   Complication of vascular dialysis catheter 08/02/2019   Drug abuse counseling and surveillance of drug abuser 08/02/2019   Other specified coagulation defects ([place]) 08/02/2019   Secondary hyperparathyroidism of renal origin ([place]) 08/02/2019   Unspecified atrial fibrillation ([place]) 08/02/2019   ESRD on hemodialysis ([place])    Cocaine abuse ([place])    CHF exacerbation ([place]) 07/21/2019   Diabetic foot ulcer ([place]) 02/11/2019   AVM (arteriovenous malformation)    Melena 02/07/2019   Symptomatic anemia 02/06/2019   Hyperlipidemia associated with type 2  diabetes mellitus ([place]) 05/30/2018   Right foot ulcer ([place]) 02/28/2018   Chronic obstructive pulmonary disease ([place]) 02/28/2018   Anemia of chronic disease 11/20/2016   CHF (congestive heart failure) ([place]) 11/10/2016   History of lumbar laminectomy for spinal cord decompression 02/29/2016   Type 2 diabetes mellitus with diabetic polyneuropathy, with long-term current use of insulin ([place]) 06/17/2015   HTN (hypertension) 04/26/2015   DM (diabetes mellitus) ([place]) 04/26/2015   Ischemic cardiomyopathy 05/12/2014   Shock ([place]) 09/06/2013   Bunion of left foot 11/25/2011   Bunion, right foot 11/25/2011   Polysubstance abuse ([place]) 07/28/2011   Hip fracture, left ([place]) 04/04/2011   Closed fracture of neck of femur ([place]) 04/04/2011   Insomnia 02/18/2011   Coronary artery disease involving native heart without angina pectoris 04/20/2009   Chronic combined systolic (congestive) and diastolic (congestive) heart failure ([place]) 04/20/2009   Mixed hyperlipidemia 11/20/2006   Gout 11/20/2006   TOBACCO ABUSE 11/20/2006   Essential hypertension 11/20/2006   Human immunodeficiency virus (HIV) disease ([place]) 09/02/2006    Patient's Medications  New Prescriptions   No medications on file  Previous Medications   ACETAMINOPHEN (TYLENOL) 650 MG CR TABLET    Take 1,300 mg by mouth 3 (three) times daily.   ALLOPURINOL (ZYLOPRIM) 100 MG TABLET    TAKE 1 TABLET(100 MG) BY MOUTH DAILY   ANORO ELLIPTA 62.5-25 MCG/INH AEPB    INHALE 1 PUFF BY MOUTH EVERY DAY   ATORVASTATIN (LIPITOR) 10 MG TABLET    Take 1 tablet (10 mg total) by mouth daily.   B-D UF III MINI PEN NEEDLES 31G X 5 MM MISC  USE FOUR TIMES DAILY   BIKTARVY 50-200-25 MG TABS TABLET    TAKE 1 TABLET BY MOUTH 1 TIME A DAY.   BISMUTH SUBSALICYLATE 562 MG TABS    Take 2 tablets (524 mg total) by mouth every 6 (six) hours for 14 days.   BISMUTH-METRONIDAZOLE-TETRACYCLINE (PYLERA) 140-125-125 MG CAPSULE    Take 3 capsules by mouth 4 (four) times daily -   before meals and at bedtime.   CALCIUM ACETATE 667 MG TABS    Take 667-1,334 mg by mouth See admin instructions. Take 1,334 mg by mouth three times a day with meals and 667 mg with each snack   CARVEDILOL (COREG) 3.125 MG TABLET    TAKE 1 TABLET(3.125 MG) BY MOUTH TWICE DAILY   CEFDINIR (OMNICEF) 300 MG CAPSULE    1 tablet by mouth every other day after dialysis   COLCHICINE 0.6 MG TABLET    TAKE 1 TABLET BY MOUTH AS NEEDED FOR GOUT   DICLOFENAC SODIUM (VOLTAREN) 1 % GEL    APPLY 2 GRAMS TOPICALLY TO THE AFFECTED AREA THREE TIMES DAILY AS NEEDED FOR PAIN   FOLIC ACID (FOLVITE) 1 MG TABLET    Take 1 tablet (1 mg total) by mouth daily.   GABAPENTIN (NEURONTIN) 300 MG CAPSULE    Take 1 capsule (300 mg total) by mouth daily. Dose change due to renal function   GLUCOSE BLOOD (ONETOUCH ULTRA) TEST STRIP    Check blood sugar three times daily. Dx:E11.40   INSULIN LISPRO (HUMALOG) 100 UNIT/ML KWIKPEN    INJECT 13 UNITS UNDER THE SKIN THREE TIMES DAILY AS NEEDED FOR HIGH BLOOD SUGAR(ABOVE 150)   ISOSORBIDE MONONITRATE (IMDUR) 30 MG 24 HR TABLET    TAKE 1 TABLET BY MOUTH EVERY DAY   LANCETS (ONETOUCH DELICA PLUS [id number]) MISC    1 Device by Other route 3 (three) times daily. E11.42   LATANOPROST (XALATAN) 0.005 % OPHTHALMIC SOLUTION    Place 1 drop into both eyes at bedtime.   LIDOCAINE-PRILOCAINE (EMLA) CREAM    Apply 1 application topically every Monday, Wednesday, and Friday with hemodialysis.   METHOXY PEG-EPOETIN BETA (MIRCERA IJ)    Mircera   MULTIVITAMIN (RENA-VIT) TABS TABLET    Take 1 tablet by mouth daily.   NITROGLYCERIN (NITROSTAT) 0.3 MG SL TABLET    PLACE 1 TABLET UNDER THE TONGUE AS NEEDED FOR CHEST PAIN   OCTREOTIDE (SANDOSTATIN LAR) 10 MG INJECTION    Inject 10 mg into the muscle every 28 (twenty-eight) days.   ONDANSETRON (ZOFRAN) 4 MG TABLET    Take 1 tablet (4 mg total) by mouth every 8 (eight) hours as needed for nausea or vomiting.   PANTOPRAZOLE (PROTONIX) 40 MG TABLET    Take 1  tablet (40 mg total) by mouth 2 (two) times daily before a meal.   POLYETHYLENE GLYCOL (MIRALAX / GLYCOLAX) 17 G PACKET    Take 17 g by mouth daily as needed for mild constipation.   TIMOLOL (TIMOPTIC) 0.5 % OPHTHALMIC SOLUTION    Place 1 drop into both eyes in the morning.   VITAMIN D PO    Take by mouth.  Modified Medications   No medications on file  Discontinued Medications   No medications on file    Allergies: Allergies  Allergen Reactions   Augmentin [Amoxicillin-Pot Clavulanate] Diarrhea and Other (See Comments)    Severe diarrhea   Mucinex Fast-Max Other (See Comments)    Intense sweating    Amphetamines Other (See Comments)  Unknown reaction    Past Medical History: Past Medical History:  Diagnosis Date   Acute respiratory failure ([place]) 03/01/2018   Anemia    Arthritis    "all over; mostly knees and back" (02/28/2018)   Chronic combined systolic and diastolic CHF (congestive heart failure) (HCC)    Chronic lower back pain    stenosis   Community acquired pneumonia 09/06/2013   COPD (chronic obstructive pulmonary disease) ([place])    Coronary atherosclerosis of native coronary artery    a. 02/2003 s/p CABG x 2 (VG->RI, VG->RPDA; b. 11/2019 PCI: LM nl, LAD 90d, D3 50, RI 100, LCX 100p, OM3 100 - fills via L->L collats from D2/dLAD, RCA 100p, VG->RPDA ok, VG->RI 95 (3.5x48 Synergy XD DES).   Drug abuse ([place])    hx; tested for cocaine as recently as 2/08. says he is not using drugs now - avoided defib. for this reason    ESRD (end stage renal disease) ([place])    Hemo M-W-F- [facility]   Fall at home 10/2020   GERD (gastroesophageal reflux disease)    takes OTC meds as needed   GI bleeding    a. 11/2019 EGD: angiodysplastic lesions w/ bleeding s/p argon plasma/clipping/epi inj. Multiple admissions for the same.   Glaucoma    uses eye drops daily   Hepatitis B 1968   "tx'd w/isolation; caught it from toilet stools in gym"   History of blood transfusion 03/01/2019   History of  colon polyps    benign   History of gout    takes Allopurinol daily as well as Colchicine-if needed (02/28/2018)   History of kidney stones    HTN (hypertension)    takes Coreg,Imdur.and Apresoline daily   Human immunodeficiency virus (HIV) disease ([place]) dx'd 1995   on [place] as of 12/2020.     Hyperlipidemia    Ischemic cardiomyopathy    a. 01/2019 Echo: EF 40-45%, diffuse HK, mild basal septal hypertrophy. Diast dysfxn. Nl RV size/fxn. Sev dil LA. Triv MR/TR/PR.   Muscle spasm    takes Zanaflex as needed   Myocardial infarction ([place]) ~ 2004/2005   Nocturia    Peripheral neuropathy    takes gabapentin daily   Pneumonia    "at least twice" (02/28/2018)   SDH (subdural hematoma) (HCC)    Syphilis, unspecified    Type II diabetes mellitus ([place]) 2004   Lantus daily.Average fasting blood sugar 125-199   Wears glasses    Wears partial dentures     Social History: Social History   Socioeconomic History   Marital status: Single    Spouse name: Not on file   Number of children: 1   Years of education: Not on file   Highest education level: Not on file  Occupational History   Occupation: retired  Tobacco Use   Smoking status: Every Day    Packs/day: 0.50    Years: 43.00    Pack years: 21.50    Types: Cigarettes   Smokeless tobacco: Never  Vaping Use   Vaping Use: Never used  Substance and Sexual Activity   Alcohol use: Not Currently    Alcohol/week: 12.0 standard drinks    Types: 12 Standard drinks or equivalent per week    Comment: occassional   Drug use: Not Currently    Types: Cocaine    Comment: hx of crack/cocaine 80yrs ago 10/01/2019- none   Sexual activity: Yes    Partners: Male    Comment: Declined condoms  Other Topics Concern  Not on file  Social History Narrative   Retired.       As of 05/2015:   Diet: No salt    Caffeine   Married: Single    House: Condo, 2 stories, 1 person (self)    Pets: No    Current/Past profession: N/A   Exercise: walks daily     Living Will: Yes    DNR   POA/HPOA: No       Social Determinants of Radio broadcast assistant Strain: Low Risk    Difficulty of Paying Living Expenses: Not hard at all  Food Insecurity: No Food Insecurity   Worried About Charity fundraiser in the Last Year: Never true   Arboriculturist in the Last Year: Never true  Transportation Needs: No Transportation Needs   Lack of Transportation (Medical): No   Lack of Transportation (Non-Medical): No  Physical Activity: Not on file  Stress: Not on file  Social Connections: Not on file    Labs: Lab Results  Component Value Date   HIV1RNAQUANT <20 (H) 04/06/2021   HIV1RNAQUANT <20 06/30/2020   HIV1RNAQUANT <20 NOT DETECTED 06/24/2019   CD4TABS 406 04/06/2021   CD4TABS 403 06/30/2020   CD4TABS 927 06/24/2019    RPR and STI Lab Results  Component Value Date   LABRPR REACTIVE (A) 06/30/2020   LABRPR REACTIVE (A) 06/24/2019   LABRPR REACTIVE (A) 12/12/2017   LABRPR REACTIVE (A) 06/27/2017   LABRPR REACTIVE (A) 12/29/2015   RPRTITER 1:1 (H) 06/30/2020   RPRTITER 1:1 (H) 06/24/2019   RPRTITER 1:1 (H) 12/12/2017   RPRTITER 1:1 06/27/2017   RPRTITER 1:1 12/29/2015    No flowsheet data found.  Hepatitis B Lab Results  Component Value Date   HEPBSAB Reactive (A) 11/06/2020   HEPBSAG NON REACTIVE 06/24/2021   HEPBCAB Reactive (A) 11/06/2020   Hepatitis C No results found for: HEPCAB, HCVRNAPCRQN Hepatitis A Lab Results  Component Value Date   HAV REACTIVE (A) 04/25/2018   Lipids: Lab Results  Component Value Date   CHOL 111 05/13/2021   TRIG 111 05/13/2021   HDL 32 (L) 05/13/2021   CHOLHDL 3.5 05/13/2021   VLDL 19 12/21/2019   LDLCALC 58 05/13/2021   Assessment: Ray presents to clinic today for H. Pylori follow-up. He has been taking pantoprazole BID, bismuth 2 tablets QID, tetracycline QID, and metronidazole QID with his pillboxes that I filled last week. His pillboxes were empty, and he said he took all his  medicine everyday. He said he will take the two antibiotics together each time and then chew the bismuth. He asked to not have the pantoprazole placed in the pillboxes as he is already used to taking it BID everyday and didn't want to mistakenly take 2 extra doses during the day. Refilled his pill boxes for his second and final week of treatment and will follow-up with him next week. After that, he will follow-up with Dr. [name] in 3 months for another breath test. No other questions or concerns at this time. He stated he did experience some diarrhea initially but then took some of the medicine around the time he had food to help reduce the symptoms. Patient has also received his updated COVID booster and flu shot this year.  Plan: Refill pill boxes Follow-up in 1 week to check adherence  Alfonse Spruce, PharmD, CPP Clinical Pharmacist Practitioner Infectious Diseases [facility] 07/27/2021, 9:51 AM

## 2021-07-28 DIAGNOSIS — N2581 Secondary hyperparathyroidism of renal origin: Secondary | ICD-10-CM | POA: Diagnosis not present

## 2021-07-28 DIAGNOSIS — N186 End stage renal disease: Secondary | ICD-10-CM | POA: Diagnosis not present

## 2021-07-28 DIAGNOSIS — D649 Anemia, unspecified: Secondary | ICD-10-CM | POA: Diagnosis not present

## 2021-07-28 DIAGNOSIS — E1122 Type 2 diabetes mellitus with diabetic chronic kidney disease: Secondary | ICD-10-CM | POA: Diagnosis not present

## 2021-07-28 DIAGNOSIS — Z992 Dependence on renal dialysis: Secondary | ICD-10-CM | POA: Diagnosis not present

## 2021-07-28 DIAGNOSIS — D631 Anemia in chronic kidney disease: Secondary | ICD-10-CM | POA: Diagnosis not present

## 2021-07-30 DIAGNOSIS — N2581 Secondary hyperparathyroidism of renal origin: Secondary | ICD-10-CM | POA: Diagnosis not present

## 2021-07-30 DIAGNOSIS — N186 End stage renal disease: Secondary | ICD-10-CM | POA: Diagnosis not present

## 2021-07-30 DIAGNOSIS — Z992 Dependence on renal dialysis: Secondary | ICD-10-CM | POA: Diagnosis not present

## 2021-07-30 DIAGNOSIS — D631 Anemia in chronic kidney disease: Secondary | ICD-10-CM | POA: Diagnosis not present

## 2021-07-30 DIAGNOSIS — D649 Anemia, unspecified: Secondary | ICD-10-CM | POA: Diagnosis not present

## 2021-07-30 DIAGNOSIS — E1122 Type 2 diabetes mellitus with diabetic chronic kidney disease: Secondary | ICD-10-CM | POA: Diagnosis not present

## 2021-08-02 DIAGNOSIS — N186 End stage renal disease: Secondary | ICD-10-CM | POA: Diagnosis not present

## 2021-08-02 DIAGNOSIS — N2581 Secondary hyperparathyroidism of renal origin: Secondary | ICD-10-CM | POA: Diagnosis not present

## 2021-08-02 DIAGNOSIS — Z992 Dependence on renal dialysis: Secondary | ICD-10-CM | POA: Diagnosis not present

## 2021-08-02 DIAGNOSIS — D649 Anemia, unspecified: Secondary | ICD-10-CM | POA: Diagnosis not present

## 2021-08-02 DIAGNOSIS — E1122 Type 2 diabetes mellitus with diabetic chronic kidney disease: Secondary | ICD-10-CM | POA: Diagnosis not present

## 2021-08-02 DIAGNOSIS — D631 Anemia in chronic kidney disease: Secondary | ICD-10-CM | POA: Diagnosis not present

## 2021-08-03 ENCOUNTER — Ambulatory Visit (INDEPENDENT_AMBULATORY_CARE_PROVIDER_SITE_OTHER): Payer: Medicare Other | Admitting: Pharmacist

## 2021-08-03 ENCOUNTER — Other Ambulatory Visit (HOSPITAL_COMMUNITY): Payer: Self-pay

## 2021-08-03 ENCOUNTER — Other Ambulatory Visit: Payer: Self-pay

## 2021-08-03 DIAGNOSIS — A048 Other specified bacterial intestinal infections: Secondary | ICD-10-CM | POA: Diagnosis not present

## 2021-08-03 NOTE — Progress Notes (Addendum)
HPI: Jacob [name] is a 70 y.o. male who presents to the [facility] clinic for H. Pylori follow-up.  Patient Active Problem List   Diagnosis Date Noted   Thrombocytopenia ([place])    Hypercalcemia 76/72/0947   Helicobacter pylori (H. pylori) infection 04/06/2021   Lumbar foraminal stenosis 03/30/2021   Gastric hemorrhage due to angiodysplasia of stomach    GI bleed 11/24/2020   COVID-19 virus infection 11/24/2020   Other mechanical complication of cranial or spinal infusion catheter, sequela 11/11/2020   Subdural hematoma 11/06/2020   Fall at home, initial encounter 11/05/2020   Nicotine dependence, cigarettes, uncomplicated 09/62/8366   Alcohol abuse 11/05/2020   Unspecified trochanteric fracture of left femur, initial encounter for closed fracture ([place]) 11/05/2020   Traumatic subdural hematoma, initial encounter 11/05/2020   Fracture of metatarsal of right foot, closed 11/05/2020   Nondisplaced fracture of greater trochanter of left femur, initial encounter for closed fracture ([place])    Uremia    Angiodysplasia of intestine with hemorrhage    Pruritus, unspecified 10/02/2020   Iron deficiency anemia    Acute blood loss anemia    Acute on chronic anemia    Abnormal nuclear stress test 12/19/2019   Type 2 diabetes mellitus with diabetic neuropathy, unspecified ([place]) 08/07/2019   Allergy, unspecified, initial encounter 29/47/6546   Complication of vascular dialysis catheter 08/02/2019   Drug abuse counseling and surveillance of drug abuser 08/02/2019   Other specified coagulation defects ([place]) 08/02/2019   Secondary hyperparathyroidism of renal origin ([place]) 08/02/2019   Unspecified atrial fibrillation ([place]) 08/02/2019   ESRD on hemodialysis ([place])    Cocaine abuse ([place])    CHF exacerbation ([place]) 07/21/2019   Diabetic foot ulcer ([place]) 02/11/2019   AVM (arteriovenous malformation)    Melena 02/07/2019   Symptomatic anemia 02/06/2019   Hyperlipidemia associated with type 2  diabetes mellitus ([place]) 05/30/2018   Right foot ulcer ([place]) 02/28/2018   Chronic obstructive pulmonary disease ([place]) 02/28/2018   Anemia of chronic disease 11/20/2016   CHF (congestive heart failure) ([place]) 11/10/2016   History of lumbar laminectomy for spinal cord decompression 02/29/2016   Type 2 diabetes mellitus with diabetic polyneuropathy, with long-term current use of insulin ([place]) 06/17/2015   HTN (hypertension) 04/26/2015   DM (diabetes mellitus) ([place]) 04/26/2015   Ischemic cardiomyopathy 05/12/2014   Shock ([place]) 09/06/2013   Bunion of left foot 11/25/2011   Bunion, right foot 11/25/2011   Polysubstance abuse ([place]) 07/28/2011   Hip fracture, left ([place]) 04/04/2011   Closed fracture of neck of femur ([place]) 04/04/2011   Insomnia 02/18/2011   Coronary artery disease involving native heart without angina pectoris 04/20/2009   Chronic combined systolic (congestive) and diastolic (congestive) heart failure ([place]) 04/20/2009   Mixed hyperlipidemia 11/20/2006   Gout 11/20/2006   TOBACCO ABUSE 11/20/2006   Essential hypertension 11/20/2006   Human immunodeficiency virus (HIV) disease ([place]) 09/02/2006    Patient's Medications  New Prescriptions   No medications on file  Previous Medications   ACETAMINOPHEN (TYLENOL) 650 MG CR TABLET    Take 1,300 mg by mouth 3 (three) times daily.   ALLOPURINOL (ZYLOPRIM) 100 MG TABLET    TAKE 1 TABLET(100 MG) BY MOUTH DAILY   ANORO ELLIPTA 62.5-25 MCG/INH AEPB    INHALE 1 PUFF BY MOUTH EVERY DAY   ATORVASTATIN (LIPITOR) 10 MG TABLET    Take 1 tablet (10 mg total) by mouth daily.   B-D UF III MINI PEN NEEDLES 31G X 5 MM MISC  USE FOUR TIMES DAILY   BIKTARVY 50-200-25 MG TABS TABLET    TAKE 1 TABLET BY MOUTH 1 TIME A DAY.   BISMUTH-METRONIDAZOLE-TETRACYCLINE (PYLERA) 140-125-125 MG CAPSULE    Take 3 capsules by mouth 4 (four) times daily -  before meals and at bedtime.   CALCIUM ACETATE 667 MG TABS    Take 667-1,334 mg by mouth See admin  instructions. Take 1,334 mg by mouth three times a day with meals and 667 mg with each snack   CARVEDILOL (COREG) 3.125 MG TABLET    TAKE 1 TABLET(3.125 MG) BY MOUTH TWICE DAILY   CEFDINIR (OMNICEF) 300 MG CAPSULE    1 tablet by mouth every other day after dialysis   COLCHICINE 0.6 MG TABLET    TAKE 1 TABLET BY MOUTH AS NEEDED FOR GOUT   DICLOFENAC SODIUM (VOLTAREN) 1 % GEL    APPLY 2 GRAMS TOPICALLY TO THE AFFECTED AREA THREE TIMES DAILY AS NEEDED FOR PAIN   FOLIC ACID (FOLVITE) 1 MG TABLET    Take 1 tablet (1 mg total) by mouth daily.   GABAPENTIN (NEURONTIN) 300 MG CAPSULE    Take 1 capsule (300 mg total) by mouth daily. Dose change due to renal function   GLUCOSE BLOOD (ONETOUCH ULTRA) TEST STRIP    Check blood sugar three times daily. Dx:E11.40   INSULIN LISPRO (HUMALOG) 100 UNIT/ML KWIKPEN    INJECT 13 UNITS UNDER THE SKIN THREE TIMES DAILY AS NEEDED FOR HIGH BLOOD SUGAR(ABOVE 150)   ISOSORBIDE MONONITRATE (IMDUR) 30 MG 24 HR TABLET    TAKE 1 TABLET BY MOUTH EVERY DAY   LANCETS (ONETOUCH DELICA PLUS NOMVEH20N) MISC    1 Device by Other route 3 (three) times daily. E11.42   LATANOPROST (XALATAN) 0.005 % OPHTHALMIC SOLUTION    Place 1 drop into both eyes at bedtime.   LIDOCAINE-PRILOCAINE (EMLA) CREAM    Apply 1 application topically every Monday, Wednesday, and Friday with hemodialysis.   METHOXY PEG-EPOETIN BETA (MIRCERA IJ)    Mircera   MULTIVITAMIN (RENA-VIT) TABS TABLET    Take 1 tablet by mouth daily.   NITROGLYCERIN (NITROSTAT) 0.3 MG SL TABLET    PLACE 1 TABLET UNDER THE TONGUE AS NEEDED FOR CHEST PAIN   OCTREOTIDE (SANDOSTATIN LAR) 10 MG INJECTION    Inject 10 mg into the muscle every 28 (twenty-eight) days.   ONDANSETRON (ZOFRAN) 4 MG TABLET    Take 1 tablet (4 mg total) by mouth every 8 (eight) hours as needed for nausea or vomiting.   PANTOPRAZOLE (PROTONIX) 40 MG TABLET    Take 1 tablet (40 mg total) by mouth 2 (two) times daily before a meal.   POLYETHYLENE GLYCOL (MIRALAX /  GLYCOLAX) 17 G PACKET    Take 17 g by mouth daily as needed for mild constipation.   TIMOLOL (TIMOPTIC) 0.5 % OPHTHALMIC SOLUTION    Place 1 drop into both eyes in the morning.   VITAMIN D PO    Take by mouth.  Modified Medications   No medications on file  Discontinued Medications   No medications on file    Allergies: Allergies  Allergen Reactions   Augmentin [Amoxicillin-Pot Clavulanate] Diarrhea and Other (See Comments)    Severe diarrhea   Mucinex Fast-Max Other (See Comments)    Intense sweating    Amphetamines Other (See Comments)    Unknown reaction    Past Medical History: Past Medical History:  Diagnosis Date   Acute respiratory failure ([place]) 03/01/2018   Anemia  Arthritis    "all over; mostly knees and back" (02/28/2018)   Chronic combined systolic and diastolic CHF (congestive heart failure) (HCC)    Chronic lower back pain    stenosis   Community acquired pneumonia 09/06/2013   COPD (chronic obstructive pulmonary disease) ([place])    Coronary atherosclerosis of native coronary artery    a. 02/2003 s/p CABG x 2 (VG->RI, VG->RPDA; b. 11/2019 PCI: LM nl, LAD 90d, D3 50, RI 100, LCX 100p, OM3 100 - fills via L->L collats from D2/dLAD, RCA 100p, VG->RPDA ok, VG->RI 95 (3.5x48 Synergy XD DES).   Drug abuse ([place])    hx; tested for cocaine as recently as 2/08. says he is not using drugs now - avoided defib. for this reason    ESRD (end stage renal disease) ([place])    Hemo M-W-F- [facility]   Fall at home 10/2020   GERD (gastroesophageal reflux disease)    takes OTC meds as needed   GI bleeding    a. 11/2019 EGD: angiodysplastic lesions w/ bleeding s/p argon plasma/clipping/epi inj. Multiple admissions for the same.   Glaucoma    uses eye drops daily   Hepatitis B 1968   "tx'd w/isolation; caught it from toilet stools in gym"   History of blood transfusion 03/01/2019   History of colon polyps    benign   History of gout    takes Allopurinol daily as well as Colchicine-if  needed (02/28/2018)   History of kidney stones    HTN (hypertension)    takes Coreg,Imdur.and Apresoline daily   Human immunodeficiency virus (HIV) disease ([place]) dx'd 1995   on [place] as of 12/2020.     Hyperlipidemia    Ischemic cardiomyopathy    a. 01/2019 Echo: EF 40-45%, diffuse HK, mild basal septal hypertrophy. Diast dysfxn. Nl RV size/fxn. Sev dil LA. Triv MR/TR/PR.   Muscle spasm    takes Zanaflex as needed   Myocardial infarction ([place]) ~ 2004/2005   Nocturia    Peripheral neuropathy    takes gabapentin daily   Pneumonia    "at least twice" (02/28/2018)   SDH (subdural hematoma) (HCC)    Syphilis, unspecified    Type II diabetes mellitus ([place]) 2004   Lantus daily.Average fasting blood sugar 125-199   Wears glasses    Wears partial dentures     Social History: Social History   Socioeconomic History   Marital status: Single    Spouse name: Not on file   Number of children: 1   Years of education: Not on file   Highest education level: Not on file  Occupational History   Occupation: retired  Tobacco Use   Smoking status: Every Day    Packs/day: 0.50    Years: 43.00    Pack years: 21.50    Types: Cigarettes   Smokeless tobacco: Never  Vaping Use   Vaping Use: Never used  Substance and Sexual Activity   Alcohol use: Not Currently    Alcohol/week: 12.0 standard drinks    Types: 12 Standard drinks or equivalent per week    Comment: occassional   Drug use: Not Currently    Types: Cocaine    Comment: hx of crack/cocaine 22yrs ago 10/01/2019- none   Sexual activity: Yes    Partners: Male    Comment: Declined condoms  Other Topics Concern   Not on file  Social History Narrative   Retired.       As of 05/2015:   Diet: No salt  Caffeine   Married: Single    House: Condo, 2 stories, 1 person (self)    Pets: No    Current/Past profession: N/A   Exercise: walks daily    Living Will: Yes    DNR   POA/HPOA: No       Social Determinants of Adult nurse Strain: Low Risk    Difficulty of Paying Living Expenses: Not hard at all  Food Insecurity: No Food Insecurity   Worried About Charity fundraiser in the Last Year: Never true   Arboriculturist in the Last Year: Never true  Transportation Needs: No Transportation Needs   Lack of Transportation (Medical): No   Lack of Transportation (Non-Medical): No  Physical Activity: Not on file  Stress: Not on file  Social Connections: Not on file    Labs: Lab Results  Component Value Date   HIV1RNAQUANT <20 (H) 04/06/2021   HIV1RNAQUANT <20 06/30/2020   HIV1RNAQUANT <20 NOT DETECTED 06/24/2019   CD4TABS 406 04/06/2021   CD4TABS 403 06/30/2020   CD4TABS 927 06/24/2019    RPR and STI Lab Results  Component Value Date   LABRPR REACTIVE (A) 06/30/2020   LABRPR REACTIVE (A) 06/24/2019   LABRPR REACTIVE (A) 12/12/2017   LABRPR REACTIVE (A) 06/27/2017   LABRPR REACTIVE (A) 12/29/2015   RPRTITER 1:1 (H) 06/30/2020   RPRTITER 1:1 (H) 06/24/2019   RPRTITER 1:1 (H) 12/12/2017   RPRTITER 1:1 06/27/2017   RPRTITER 1:1 12/29/2015    No flowsheet data found.  Hepatitis B Lab Results  Component Value Date   HEPBSAB Reactive (A) 11/06/2020   HEPBSAG NON REACTIVE 06/24/2021   HEPBCAB Reactive (A) 11/06/2020   Hepatitis C No results found for: HEPCAB, HCVRNAPCRQN Hepatitis A Lab Results  Component Value Date   HAV REACTIVE (A) 04/25/2018   Lipids: Lab Results  Component Value Date   CHOL 111 05/13/2021   TRIG 111 05/13/2021   HDL 32 (L) 05/13/2021   CHOLHDL 3.5 05/13/2021   VLDL 19 12/21/2019   LDLCALC 58 05/13/2021    Assessment: Ray presents to clinic today for H. Pylori. He will complete his H. Pylori tonight. He verbally stated (without prompting) that he took his 4 pills (2 bismuth caplets, tetracycline, and metronidazole) every 4 hours (5am, 11am, 6pm, and 9pm) along with his pantoprazole twice daily. After multiple promptings, he demonstrated that he  was adherent to this regimen over the last 2 weeks. Initially he experienced mild gas and diarrhea, but that quickly dissipated over the first week. He will follow-up with Dr. [name] in 1 month.  Plan: Follow-up with Dr. [name] with on 11/15 at 2:00pm  Alfonse Spruce, PharmD, CPP Clinical Pharmacist Practitioner [facility] 08/03/2021, 9:56 AM

## 2021-08-04 DIAGNOSIS — N2581 Secondary hyperparathyroidism of renal origin: Secondary | ICD-10-CM | POA: Diagnosis not present

## 2021-08-04 DIAGNOSIS — Z992 Dependence on renal dialysis: Secondary | ICD-10-CM | POA: Diagnosis not present

## 2021-08-04 DIAGNOSIS — N186 End stage renal disease: Secondary | ICD-10-CM | POA: Diagnosis not present

## 2021-08-04 DIAGNOSIS — D631 Anemia in chronic kidney disease: Secondary | ICD-10-CM | POA: Diagnosis not present

## 2021-08-04 DIAGNOSIS — D649 Anemia, unspecified: Secondary | ICD-10-CM | POA: Diagnosis not present

## 2021-08-04 DIAGNOSIS — E1122 Type 2 diabetes mellitus with diabetic chronic kidney disease: Secondary | ICD-10-CM | POA: Diagnosis not present

## 2021-08-05 ENCOUNTER — Ambulatory Visit (HOSPITAL_COMMUNITY)
Admission: RE | Admit: 2021-08-05 | Discharge: 2021-08-05 | Disposition: A | Discharge status: Home / Self Care | Payer: Medicare Other | Source: Ambulatory Visit | Attending: Gastroenterology | Admitting: Gastroenterology

## 2021-08-05 ENCOUNTER — Other Ambulatory Visit: Payer: Self-pay

## 2021-08-05 ENCOUNTER — Other Ambulatory Visit: Payer: Self-pay | Admitting: Nurse Practitioner

## 2021-08-05 ENCOUNTER — Other Ambulatory Visit: Payer: Self-pay | Admitting: Family

## 2021-08-05 DIAGNOSIS — I5042 Chronic combined systolic (congestive) and diastolic (congestive) heart failure: Secondary | ICD-10-CM

## 2021-08-05 DIAGNOSIS — Q2730 Arteriovenous malformation, site unspecified: Secondary | ICD-10-CM | POA: Diagnosis not present

## 2021-08-05 DIAGNOSIS — M109 Gout, unspecified: Secondary | ICD-10-CM

## 2021-08-05 MED ORDER — OCTREOTIDE ACETATE 10 MG IM KIT
10.0000 mg | PACK | INTRAMUSCULAR | Status: DC
  Administered 2021-08-05: 10 mg via INTRAMUSCULAR
  Filled 2021-08-05: qty 1

## 2021-08-06 DIAGNOSIS — Z992 Dependence on renal dialysis: Secondary | ICD-10-CM | POA: Diagnosis not present

## 2021-08-06 DIAGNOSIS — E1122 Type 2 diabetes mellitus with diabetic chronic kidney disease: Secondary | ICD-10-CM | POA: Diagnosis not present

## 2021-08-06 DIAGNOSIS — D649 Anemia, unspecified: Secondary | ICD-10-CM | POA: Diagnosis not present

## 2021-08-06 DIAGNOSIS — N2581 Secondary hyperparathyroidism of renal origin: Secondary | ICD-10-CM | POA: Diagnosis not present

## 2021-08-06 DIAGNOSIS — N186 End stage renal disease: Secondary | ICD-10-CM | POA: Diagnosis not present

## 2021-08-06 DIAGNOSIS — D631 Anemia in chronic kidney disease: Secondary | ICD-10-CM | POA: Diagnosis not present

## 2021-08-09 DIAGNOSIS — D649 Anemia, unspecified: Secondary | ICD-10-CM | POA: Diagnosis not present

## 2021-08-09 DIAGNOSIS — D631 Anemia in chronic kidney disease: Secondary | ICD-10-CM | POA: Diagnosis not present

## 2021-08-09 DIAGNOSIS — E1122 Type 2 diabetes mellitus with diabetic chronic kidney disease: Secondary | ICD-10-CM | POA: Diagnosis not present

## 2021-08-09 DIAGNOSIS — N186 End stage renal disease: Secondary | ICD-10-CM | POA: Diagnosis not present

## 2021-08-09 DIAGNOSIS — Z992 Dependence on renal dialysis: Secondary | ICD-10-CM | POA: Diagnosis not present

## 2021-08-09 DIAGNOSIS — N2581 Secondary hyperparathyroidism of renal origin: Secondary | ICD-10-CM | POA: Diagnosis not present

## 2021-08-11 DIAGNOSIS — E1122 Type 2 diabetes mellitus with diabetic chronic kidney disease: Secondary | ICD-10-CM | POA: Diagnosis not present

## 2021-08-11 DIAGNOSIS — Z992 Dependence on renal dialysis: Secondary | ICD-10-CM | POA: Diagnosis not present

## 2021-08-11 DIAGNOSIS — D631 Anemia in chronic kidney disease: Secondary | ICD-10-CM | POA: Diagnosis not present

## 2021-08-11 DIAGNOSIS — N186 End stage renal disease: Secondary | ICD-10-CM | POA: Diagnosis not present

## 2021-08-11 DIAGNOSIS — N2581 Secondary hyperparathyroidism of renal origin: Secondary | ICD-10-CM | POA: Diagnosis not present

## 2021-08-11 DIAGNOSIS — D649 Anemia, unspecified: Secondary | ICD-10-CM | POA: Diagnosis not present

## 2021-08-13 DIAGNOSIS — N2581 Secondary hyperparathyroidism of renal origin: Secondary | ICD-10-CM | POA: Diagnosis not present

## 2021-08-13 DIAGNOSIS — D631 Anemia in chronic kidney disease: Secondary | ICD-10-CM | POA: Diagnosis not present

## 2021-08-13 DIAGNOSIS — D649 Anemia, unspecified: Secondary | ICD-10-CM | POA: Diagnosis not present

## 2021-08-13 DIAGNOSIS — Z992 Dependence on renal dialysis: Secondary | ICD-10-CM | POA: Diagnosis not present

## 2021-08-13 DIAGNOSIS — E1122 Type 2 diabetes mellitus with diabetic chronic kidney disease: Secondary | ICD-10-CM | POA: Diagnosis not present

## 2021-08-13 DIAGNOSIS — N186 End stage renal disease: Secondary | ICD-10-CM | POA: Diagnosis not present

## 2021-08-16 DIAGNOSIS — N2581 Secondary hyperparathyroidism of renal origin: Secondary | ICD-10-CM | POA: Diagnosis not present

## 2021-08-16 DIAGNOSIS — D631 Anemia in chronic kidney disease: Secondary | ICD-10-CM | POA: Diagnosis not present

## 2021-08-16 DIAGNOSIS — Z992 Dependence on renal dialysis: Secondary | ICD-10-CM | POA: Diagnosis not present

## 2021-08-16 DIAGNOSIS — D649 Anemia, unspecified: Secondary | ICD-10-CM | POA: Diagnosis not present

## 2021-08-16 DIAGNOSIS — E1122 Type 2 diabetes mellitus with diabetic chronic kidney disease: Secondary | ICD-10-CM | POA: Diagnosis not present

## 2021-08-16 DIAGNOSIS — N186 End stage renal disease: Secondary | ICD-10-CM | POA: Diagnosis not present

## 2021-08-17 ENCOUNTER — Encounter: Payer: Self-pay | Admitting: Family Medicine

## 2021-08-17 ENCOUNTER — Ambulatory Visit (INDEPENDENT_AMBULATORY_CARE_PROVIDER_SITE_OTHER): Payer: Medicare Other | Admitting: Family Medicine

## 2021-08-17 ENCOUNTER — Other Ambulatory Visit: Payer: Self-pay

## 2021-08-17 VITALS — BP 126/66 | HR 75 | Temp 97.0°F | Ht 71.0 in | Wt 167.0 lb

## 2021-08-17 DIAGNOSIS — D649 Anemia, unspecified: Secondary | ICD-10-CM | POA: Diagnosis not present

## 2021-08-17 DIAGNOSIS — M48061 Spinal stenosis, lumbar region without neurogenic claudication: Secondary | ICD-10-CM | POA: Diagnosis not present

## 2021-08-17 DIAGNOSIS — I5042 Chronic combined systolic (congestive) and diastolic (congestive) heart failure: Secondary | ICD-10-CM | POA: Diagnosis not present

## 2021-08-17 DIAGNOSIS — I10 Essential (primary) hypertension: Secondary | ICD-10-CM | POA: Diagnosis not present

## 2021-08-17 DIAGNOSIS — I509 Heart failure, unspecified: Secondary | ICD-10-CM | POA: Diagnosis not present

## 2021-08-17 MED ORDER — OXYCODONE-ACETAMINOPHEN 5-325 MG PO TABS: 1.0000 | ORAL_TABLET | ORAL | 0 refills | Status: DC | PRN

## 2021-08-17 MED FILL — Octreotide Acetate For IM Inj Kit 10 MG: INTRAMUSCULAR | Qty: 1 | Status: AC

## 2021-08-17 NOTE — Progress Notes (Signed)
Provider:  Alain Honey, MD  Careteam: Patient Care Team: [name], MD as PCP - General (Family Medicine) [name], MD as PCP - Infectious Diseases (Infectious Diseases) [name], MD as PCP - Cardiology (Cardiology) [name], MD as Consulting Physician (Ophthalmology) [name], MD as Consulting Physician (Cardiology) [name], MD as Consulting Physician (Infectious Diseases) Gardiner Barefoot, DPM as Consulting Physician (Podiatry) [name], Nelda Bucks, NP as Nurse Practitioner (Family Medicine) Kidney, [place]:  Grant Directive information Does Patient Have a Medical Advance Directive?: Yes, Type of Advance Directive: Healthcare Power of [place];Living will;Out of facility DNR (pink MOST or yellow form), Pre-existing out of facility DNR order (yellow form or pink MOST form): Yellow form placed in chart (order not valid for inpatient use), Does patient want to make changes to medical advance directive?: No - Patient declined  Allergies  Allergen Reactions   Augmentin [Amoxicillin-Pot Clavulanate] Diarrhea and Other (See Comments)    Severe diarrhea   Mucinex Fast-Max Other (See Comments)    Intense sweating    Amphetamines Other (See Comments)    Unknown reaction    Chief Complaint  Patient presents with   Medical Management of Chronic Issues    Patient returns to the office for follow up.    Quality Metric Gaps    Foot and eye exam, hemoglobin A1C     HPI: Patient is a 70 y.o. male was seen here about a month ago and given Rx for analgesic.  He has a history of back pain but had not had any medicine for relief and after discussion felt like I might be able to relieve some of his pain if he was willing to abide by conditions of the drug contract.  I was impressed that he could do that.  Today he reports to feeling much better.  The oxycodone helps.  He was given 30 pills about 1 month ago and still  has 4 pills left so he is taking on average about 1/day. Denies any other symptoms.  Breathing is good. He has some metrics that have not been met but he does have appointments coming up in the next month or so with ophthalmology as well as podiatrist to do foot and eye exams.  Review of Systems:  Review of Systems  Constitutional: Negative.   Respiratory: Negative.    Cardiovascular: Negative.   Genitourinary: Negative.   Neurological: Negative.   Psychiatric/Behavioral: Negative.    All other systems reviewed and are negative.  Past Medical History:  Diagnosis Date   Acute respiratory failure ([place]) 03/01/2018   Anemia    Arthritis    "all over; mostly knees and back" (02/28/2018)   Chronic combined systolic and diastolic CHF (congestive heart failure) (HCC)    Chronic lower back pain    stenosis   Community acquired pneumonia 09/06/2013   COPD (chronic obstructive pulmonary disease) ([place])    Coronary atherosclerosis of native coronary artery    a. 02/2003 s/p CABG x 2 (VG->RI, VG->RPDA; b. 11/2019 PCI: LM nl, LAD 90d, D3 50, RI 100, LCX 100p, OM3 100 - fills via L->L collats from D2/dLAD, RCA 100p, VG->RPDA ok, VG->RI 95 (3.5x48 Synergy XD DES).   Drug abuse ([place])    hx; tested for cocaine as recently as 2/08. says he is not using drugs now - avoided defib. for this reason    ESRD (end stage renal disease) ([place])    Hemo M-W-F-  Richarda Blade   Fall at home 10/2020   GERD (gastroesophageal reflux disease)    takes OTC meds as needed   GI bleeding    a. 11/2019 EGD: angiodysplastic lesions w/ bleeding s/p argon plasma/clipping/epi inj. Multiple admissions for the same.   Glaucoma    uses eye drops daily   Hepatitis B 1968   "tx'd w/isolation; caught it from toilet stools in gym"   History of blood transfusion 03/01/2019   History of colon polyps    benign   History of gout    takes Allopurinol daily as well as Colchicine-if needed (02/28/2018)   History of kidney stones    HTN  (hypertension)    takes Coreg,Imdur.and Apresoline daily   Human immunodeficiency virus (HIV) disease ([place]) dx'd 1995   on [place] as of 12/2020.     Hyperlipidemia    Ischemic cardiomyopathy    a. 01/2019 Echo: EF 40-45%, diffuse HK, mild basal septal hypertrophy. Diast dysfxn. Nl RV size/fxn. Sev dil LA. Triv MR/TR/PR.   Muscle spasm    takes Zanaflex as needed   Myocardial infarction ([place]) ~ 2004/2005   Nocturia    Peripheral neuropathy    takes gabapentin daily   Pneumonia    "at least twice" (02/28/2018)   SDH (subdural hematoma)    Syphilis, unspecified    Type II diabetes mellitus ([place]) 2004   Lantus daily.Average fasting blood sugar 125-199   Wears glasses    Wears partial dentures    Past Surgical History:  Procedure Laterality Date   AV FISTULA PLACEMENT Left 08/02/2018   Procedure: ARTERIOVENOUS (AV) FISTULA CREATION  left arm radiocephlic;  Surgeon: Marty Heck, MD;  Location: [place];  Service: Vascular;  Laterality: Left;   AV FISTULA PLACEMENT Left 08/01/2019   Procedure: LEFT BRACHIOCEPHALIC ARTERIOVENOUS (AV) FISTULA CREATION;  Surgeon: [name], MD;  Location: [place];  Service: Vascular;  Laterality: Left;   [place] Left 10/03/2019   Procedure: BASILIC VEIN TRANSPOSITION LEFT SECOND STAGE;  Surgeon: [name], MD;  Location: [place];  Service: Vascular;  Laterality: Left;   BIOPSY  01/25/2021   Procedure: BIOPSY;  Surgeon: Doran Stabler, MD;  Location: [facility] ENDOSCOPY;  Service: Gastroenterology;;   CARDIAC CATHETERIZATION  10/2002; 12/19/2004   [name] 03/08/2011   COLONOSCOPY  2013   [place]    CORONARY ARTERY BYPASS GRAFT  02/24/2003   CABG X2/notes 03/08/2011   CORONARY STENT INTERVENTION N/A 12/19/2019   Procedure: CORONARY STENT INTERVENTION;  Surgeon: Jettie Booze, MD;  Location: [place] CV LAB;  Service: Cardiovascular;  Laterality: N/A;   ENTEROSCOPY N/A 01/25/2021   Procedure: ENTEROSCOPY;  Surgeon: Doran Stabler, MD;  Location: [place];  Service: Gastroenterology;  Laterality: N/A;   ENTEROSCOPY N/A 02/13/2021   Procedure: ENTEROSCOPY;  Surgeon: Jackquline Denmark, MD;  Location: [facility] ENDOSCOPY;  Service: Endoscopy;  Laterality: N/A;   ENTEROSCOPY N/A 05/07/2021   Procedure: ENTEROSCOPY;  Surgeon: Yetta Flock, MD;  Location: [facility] ENDOSCOPY;  Service: Gastroenterology;  Laterality: N/A;   ESOPHAGOGASTRODUODENOSCOPY (EGD) WITH PROPOFOL N/A 02/08/2019   Procedure: ESOPHAGOGASTRODUODENOSCOPY (EGD) WITH PROPOFOL;  Surgeon: Milus Banister, MD;  Location: [place];  Service: Gastroenterology;  Laterality: N/A;   ESOPHAGOGASTRODUODENOSCOPY (EGD) WITH PROPOFOL N/A 12/22/2019   Procedure: ESOPHAGOGASTRODUODENOSCOPY (EGD) WITH PROPOFOL;  Surgeon: Lavena Bullion, DO;  Location: [place];  Service: Gastroenterology;  Laterality: N/A;   ESOPHAGOGASTRODUODENOSCOPY (EGD) WITH PROPOFOL N/A 10/19/2020   Procedure: ESOPHAGOGASTRODUODENOSCOPY (  EGD) WITH PROPOFOL;  Surgeon: Jackquline Denmark, MD;  Location: [facility] ENDOSCOPY;  Service: Endoscopy;  Laterality: N/A;   ESOPHAGOGASTRODUODENOSCOPY (EGD) WITH PROPOFOL N/A 12/22/2020   Procedure: ESOPHAGOGASTRODUODENOSCOPY (EGD) WITH PROPOFOL;  Surgeon: Gatha Mayer, MD;  Location: [place];  Service: Endoscopy;  Laterality: N/A;   ESOPHAGOGASTRODUODENOSCOPY (EGD) WITH PROPOFOL N/A 01/09/2021   Procedure: ESOPHAGOGASTRODUODENOSCOPY (EGD) WITH PROPOFOL;  Surgeon: Irene Shipper, MD;  Location: [facility] ENDOSCOPY;  Service: Endoscopy;  Laterality: N/A;   HEMOSTASIS CLIP PLACEMENT  12/22/2019   Procedure: HEMOSTASIS CLIP PLACEMENT;  Surgeon: Lavena Bullion, DO;  Location: [place] ENDOSCOPY;  Service: Gastroenterology;;   HEMOSTASIS CLIP PLACEMENT  12/22/2020   Procedure: HEMOSTASIS CLIP PLACEMENT;  Surgeon: Gatha Mayer, MD;  Location: [facility] ENDOSCOPY;  Service: Endoscopy;;   HEMOSTASIS CONTROL  12/22/2020   Procedure: HEMOSTASIS CONTROL/hemospray;  Surgeon: Gatha Mayer,  MD;  Location: [place];  Service: Endoscopy;;   HOT HEMOSTASIS N/A 02/08/2019   Procedure: HOT HEMOSTASIS (ARGON PLASMA COAGULATION/BICAP);  Surgeon: Milus Banister, MD;  Location: [facility] ENDOSCOPY;  Service: Gastroenterology;  Laterality: N/A;   HOT HEMOSTASIS N/A 12/22/2019   Procedure: HOT HEMOSTASIS (ARGON PLASMA COAGULATION/BICAP);  Surgeon: Lavena Bullion, DO;  Location: [facility] ENDOSCOPY;  Service: Gastroenterology;  Laterality: N/A;   HOT HEMOSTASIS N/A 10/19/2020   Procedure: HOT HEMOSTASIS (ARGON PLASMA COAGULATION/BICAP);  Surgeon: Jackquline Denmark, MD;  Location: [facility] ENDOSCOPY;  Service: Endoscopy;  Laterality: N/A;   HOT HEMOSTASIS N/A 12/22/2020   Procedure: HOT HEMOSTASIS (ARGON PLASMA COAGULATION/BICAP);  Surgeon: Gatha Mayer, MD;  Location: [facility] ENDOSCOPY;  Service: Endoscopy;  Laterality: N/A;   HOT HEMOSTASIS N/A 01/09/2021   Procedure: HOT HEMOSTASIS (ARGON PLASMA COAGULATION/BICAP);  Surgeon: Irene Shipper, MD;  Location: [facility] ENDOSCOPY;  Service: Endoscopy;  Laterality: N/A;   HOT HEMOSTASIS N/A 01/25/2021   Procedure: HOT HEMOSTASIS (ARGON PLASMA COAGULATION/BICAP);  Surgeon: Doran Stabler, MD;  Location: [place];  Service: Gastroenterology;  Laterality: N/A;   HOT HEMOSTASIS N/A 02/13/2021   Procedure: HOT HEMOSTASIS (ARGON PLASMA COAGULATION/BICAP);  Surgeon: Jackquline Denmark, MD;  Location: [facility] ENDOSCOPY;  Service: Endoscopy;  Laterality: N/A;   HOT HEMOSTASIS N/A 05/07/2021   Procedure: HOT HEMOSTASIS (ARGON PLASMA COAGULATION/BICAP);  Surgeon: Yetta Flock, MD;  Location: [facility] ENDOSCOPY;  Service: Gastroenterology;  Laterality: N/A;   INTERTROCHANTERIC HIP FRACTURE SURGERY Left 11/2006   [name] 03/08/2011   INTRAVASCULAR ULTRASOUND/IVUS N/A 12/19/2019   Procedure: Intravascular Ultrasound/IVUS;  Surgeon: Jettie Booze, MD;  Location: [place] CV LAB;  Service: Cardiovascular;  Laterality: N/A;   IR FLUORO GUIDE CV LINE RIGHT  07/24/2019   IR FLUORO GUIDE CV  LINE RIGHT  07/30/2019   IR US GUIDE VASC ACCESS RIGHT  07/24/2019   IR US GUIDE VASC ACCESS RIGHT  07/30/2019   LAPAROSCOPIC CHOLECYSTECTOMY  05/2006   LIGATION OF COMPETING BRANCHES OF ARTERIOVENOUS FISTULA Left 11/05/2018   Procedure: LIGATION OF COMPETING BRANCHES OF ARTERIOVENOUS FISTULA  LEFT  ARM;  Surgeon: Marty Heck, MD;  Location[facility] OR;  Service: Vascular;  Laterality: Left;   LUMBAR LAMINECTOMY/DECOMPRESSION MICRODISCECTOMY N/A 02/29/2016   Procedure: Left L4-5 Lateral Recess Decompression, Removal Extradural Intraspinal Facet Cyst;  Surgeon: Marybelle Killings, MD;  Location: [place];  Service: Orthopedics;  Laterality: N/A;   MULTIPLE TOOTH EXTRACTIONS     ORIF MANDIBULAR FRACTURE Left 08/13/2004   ORIF of left body fracture mandible with KLS Martin 2.3-mm six hole/notes 03/08/2011   RIGHT/LEFT HEART CATH AND CORONARY/GRAFT ANGIOGRAPHY N/A 12/19/2019  Procedure: RIGHT/LEFT HEART CATH AND CORONARY/GRAFT ANGIOGRAPHY;  Surgeon: Jettie Booze, MD;  Location: [place] CV LAB;  Service: Cardiovascular;  Laterality: N/A;   SCLEROTHERAPY  12/22/2019   Procedure: SCLEROTHERAPY;  Surgeon: Lavena Bullion, DO;  Location: [place];  Service: Gastroenterology;;   Clide Deutscher  02/13/2021   Procedure: Clide Deutscher;  Surgeon: Jackquline Denmark, MD;  Location: [facility] ENDOSCOPY;  Service: Endoscopy;;   Social History:   reports that he has been smoking cigarettes. He has a 21.50 pack-year smoking history. He has never used smokeless tobacco. He reports that he does not currently use alcohol after a past usage of about 12.0 standard drinks per week. He reports that he does not currently use drugs after having used the following drugs: Cocaine.  Family History  Problem Relation Age of Onset   Heart failure Father    Hypertension Father    Diabetes Brother    Heart attack Brother    Alzheimer's disease Mother    Stroke Sister    Diabetes Sister    Alzheimer's disease Sister     Hypertension Brother    Diabetes Brother    Drug abuse Brother    Colon cancer Neg Hx     Medications: Patient's Medications  New Prescriptions   No medications on file  Previous Medications   ACETAMINOPHEN (TYLENOL) 650 MG CR TABLET    Take 1,300 mg by mouth 3 (three) times daily.   ALLOPURINOL (ZYLOPRIM) 100 MG TABLET    TAKE 1 TABLET(100 MG) BY MOUTH DAILY   ANORO ELLIPTA 62.5-25 MCG/INH AEPB    INHALE 1 PUFF BY MOUTH EVERY DAY   ATORVASTATIN (LIPITOR) 10 MG TABLET    Take 1 tablet (10 mg total) by mouth daily.   B-D UF III MINI PEN NEEDLES 31G X 5 MM MISC    USE FOUR TIMES DAILY   BISMUTH-METRONIDAZOLE-TETRACYCLINE (PYLERA) 140-125-125 MG CAPSULE    Take 3 capsules by mouth 4 (four) times daily -  before meals and at bedtime.   CALCIUM ACETATE 667 MG TABS    Take 667-1,334 mg by mouth See admin instructions. Take 1,334 mg by mouth three times a day with meals and 667 mg with each snack   CARVEDILOL (COREG) 3.125 MG TABLET    TAKE 1 TABLET(3.125 MG) BY MOUTH TWICE DAILY   COLCHICINE 0.6 MG TABLET    TAKE 1 TABLET BY MOUTH AS NEEDED FOR GOUT   DICLOFENAC SODIUM (VOLTAREN) 1 % GEL    APPLY 2 GRAMS TOPICALLY TO THE AFFECTED AREA THREE TIMES DAILY AS NEEDED FOR PAIN   FOLIC ACID (FOLVITE) 1 MG TABLET    TAKE 1 TABLET(1 MG) BY MOUTH DAILY   GABAPENTIN (NEURONTIN) 300 MG CAPSULE    TAKE 1 CAPSULE BY MOUTH DAILY. DOSE CAHNGE DUE TO RENAL FUNCTION   GLUCOSE BLOOD (ONETOUCH ULTRA) TEST STRIP    Check blood sugar three times daily. Dx:E11.40   INSULIN LISPRO (HUMALOG) 100 UNIT/ML KWIKPEN    INJECT 13 UNITS UNDER THE SKIN THREE TIMES DAILY AS NEEDED FOR HIGH BLOOD SUGAR(ABOVE 150)   ISOSORBIDE MONONITRATE (IMDUR) 30 MG 24 HR TABLET    TAKE 1 TABLET BY MOUTH EVERY DAY   LANCETS (ONETOUCH DELICA PLUS ZOXWRU04V) MISC    1 Device by Other route 3 (three) times daily. E11.42   LATANOPROST (XALATAN) 0.005 % OPHTHALMIC SOLUTION    Place 1 drop into both eyes at bedtime.   LIDOCAINE-PRILOCAINE (EMLA)  CREAM    Apply 1 application topically every  Monday, Wednesday, and Friday with hemodialysis.   METHOXY PEG-EPOETIN BETA (MIRCERA IJ)    Mircera   MULTIVITAMIN (RENA-VIT) TABS TABLET    Take 1 tablet by mouth daily.   NITROGLYCERIN (NITROSTAT) 0.3 MG SL TABLET    PLACE 1 TABLET UNDER THE TONGUE AS NEEDED FOR CHEST PAIN   OCTREOTIDE (SANDOSTATIN LAR) 10 MG INJECTION    Inject 10 mg into the muscle every 28 (twenty-eight) days.   ONDANSETRON (ZOFRAN) 4 MG TABLET    Take 1 tablet (4 mg total) by mouth every 8 (eight) hours as needed for nausea or vomiting.   OXYCODONE-ACETAMINOPHEN (PERCOCET/ROXICET) 5-325 MG TABLET    Take by mouth every 4 (four) hours as needed for severe pain.   PANTOPRAZOLE (PROTONIX) 40 MG TABLET    Take 1 tablet (40 mg total) by mouth 2 (two) times daily before a meal.   POLYETHYLENE GLYCOL (MIRALAX / GLYCOLAX) 17 G PACKET    Take 17 g by mouth daily as needed for mild constipation.   TIMOLOL (TIMOPTIC) 0.5 % OPHTHALMIC SOLUTION    Place 1 drop into both eyes in the morning.   VITAMIN D PO    Take by mouth.  Modified Medications   No medications on file  Discontinued Medications   BIKTARVY 50-200-25 MG TABS TABLET    TAKE 1 TABLET BY MOUTH 1 TIME A DAY.   CEFDINIR (OMNICEF) 300 MG CAPSULE    1 tablet by mouth every other day after dialysis    Physical Exam:  Vitals:   08/17/21 1137  BP: 126/66  Pulse: 75  Temp: (!) 97 F (36.1 C)  SpO2: 99%  Weight: 167 lb (75.8 kg)  Height: '5\' 11"'  (1.803 m)   Body mass index is 23.29 kg/m. Wt Readings from Last 3 Encounters:  08/17/21 167 lb (75.8 kg)  07/20/21 170 lb 6.4 oz (77.3 kg)  07/08/21 171 lb (77.6 kg)    Physical Exam Vitals and nursing note reviewed.  Constitutional:      Appearance: Normal appearance.  Cardiovascular:     Rate and Rhythm: Normal rate and regular rhythm.  Pulmonary:     Effort: Pulmonary effort is normal.     Breath sounds: Normal breath sounds.  Musculoskeletal:     Comments: Wearing  some sort of brace around his right lower leg.  He says this helps his sciatica and leg pain  Neurological:     General: No focal deficit present.     Mental Status: He is alert and oriented to person, place, and time.    Labs reviewed: Basic Metabolic Panel: Recent Labs    10/17/20 0139 10/17/20 1401 12/15/20 1105 12/21/20 0834 05/03/21 2135 05/04/21 0734 05/05/21 0308 05/06/21 1056 05/07/21 0406 06/23/21 1452 06/24/21 0800 07/08/21 1929  NA  --    < > 138   < > 136 136   < > 135 134* 135 133* 131*  K  --    < > 4.0   < > 4.4 5.4*   < > 4.0 4.0 6.3* 6.6* 4.3  CL  --    < > 95*   < > 98 100   < > 98 98 96* 98 91*  CO2  --    < > 29   < > 27 25   < > '26 26 22 22 27  ' GLUCOSE  --    < > 112*   < > 147* 117*   < > 132* 110* 158* 133* 173*  BUN  --    < > 51*   < > 96* 115*   < > 70* 79* 123* 128* 51*  CREATININE 8.61*   < > 5.77*   < > 5.98* 6.96*   < > 6.67* 7.71* 10.85* 11.14* 6.74*  CALCIUM  --    < > 8.7   < > 8.0* 7.8*   < > 7.5* 7.9* 9.0 9.3 9.1  MG  --    < >  --    < > 2.1 2.1  --   --  1.9  --   --   --   PHOS  --    < >  --    < > 4.2 5.3*  --  5.1* 5.9*  --  5.2*  --   TSH 0.748  --  0.49  --   --   --   --   --   --   --   --   --    < > = values in this interval not displayed.   Liver Function Tests: Recent Labs    05/03/21 0046 05/06/21 1056 05/13/21 0844 06/23/21 1452 06/24/21 0800  AST 25  --  17 22  --   ALT 15  --  12 18  --   ALKPHOS 31*  --  63 60  --   BILITOT 0.7  --  0.3 0.4  --   PROT 6.0*  --  7.2 7.7  --   ALBUMIN 2.8*   < > 4.0 2.6* 2.5*   < > = values in this interval not displayed.   Recent Labs    10/16/20 1807 12/21/20 0834 05/02/21 0743  LIPASE 70* 73* 87*   Recent Labs    05/05/21 1708  AMMONIA 49*   CBC: Recent Labs    02/25/21 1317 05/02/21 0743 05/03/21 0046 05/03/21 0101 06/23/21 1452 06/24/21 0800 07/08/21 1929  WBC 6.6   < > 6.5   < > 8.2 7.1 4.7  NEUTROABS 3,967  --  4.7  --  6.0  --   --   HGB 10.4*   < >  7.0*   < > 8.2* 7.7* 7.9*  HCT 33.1*   < > 22.7*   < > 25.7* 24.1* 25.6*  MCV 93.2   < > 92.3   < > 97.3 94.9 97.3  PLT 198   < > 101*   < > 228 227 214   < > = values in this interval not displayed.   Lipid Panel: Recent Labs    12/15/20 1105 05/13/21 0844  CHOL 143 111  HDL 31* 32*  LDLCALC 79 58  TRIG 248* 111  CHOLHDL 4.6 3.5   TSH: Recent Labs    10/17/20 0139 12/15/20 1105  TSH 0.748 0.49   A1C: Lab Results  Component Value Date   HGBA1C 5.8 (H) 05/03/2021     Assessment/Plan  1. Lumbar foraminal stenosis Will continue with limited analgesics as long as he takes these as prescribed.  I am aware of his past history of abuse - oxyCODONE-acetaminophen (PERCOCET/ROXICET) 5-325 MG tablet; Take 1 tablet by mouth every 4 (four) hours as needed for severe pain.  Dispense: 30 tablet; Refill: 0  2. Acute on chronic anemia Followed by renal.  I believe he is getting some erythropoietin and iron    4. Chronic combined systolic (congestive) and diastolic (congestive) heart failure (HCC) Compensated at this time  5. Essential hypertension Blood pressure good  at 126/66     Alain Honey, MD [place] [phone]

## 2021-08-18 DIAGNOSIS — D631 Anemia in chronic kidney disease: Secondary | ICD-10-CM | POA: Diagnosis not present

## 2021-08-18 DIAGNOSIS — D649 Anemia, unspecified: Secondary | ICD-10-CM | POA: Diagnosis not present

## 2021-08-18 DIAGNOSIS — N186 End stage renal disease: Secondary | ICD-10-CM | POA: Diagnosis not present

## 2021-08-18 DIAGNOSIS — E1122 Type 2 diabetes mellitus with diabetic chronic kidney disease: Secondary | ICD-10-CM | POA: Diagnosis not present

## 2021-08-18 DIAGNOSIS — Z992 Dependence on renal dialysis: Secondary | ICD-10-CM | POA: Diagnosis not present

## 2021-08-18 DIAGNOSIS — N2581 Secondary hyperparathyroidism of renal origin: Secondary | ICD-10-CM | POA: Diagnosis not present

## 2021-08-20 DIAGNOSIS — Z992 Dependence on renal dialysis: Secondary | ICD-10-CM | POA: Diagnosis not present

## 2021-08-20 DIAGNOSIS — N186 End stage renal disease: Secondary | ICD-10-CM | POA: Diagnosis not present

## 2021-08-20 DIAGNOSIS — E1122 Type 2 diabetes mellitus with diabetic chronic kidney disease: Secondary | ICD-10-CM | POA: Diagnosis not present

## 2021-08-20 DIAGNOSIS — N2581 Secondary hyperparathyroidism of renal origin: Secondary | ICD-10-CM | POA: Diagnosis not present

## 2021-08-20 DIAGNOSIS — D631 Anemia in chronic kidney disease: Secondary | ICD-10-CM | POA: Diagnosis not present

## 2021-08-20 DIAGNOSIS — D649 Anemia, unspecified: Secondary | ICD-10-CM | POA: Diagnosis not present

## 2021-08-23 DIAGNOSIS — I129 Hypertensive chronic kidney disease with stage 1 through stage 4 chronic kidney disease, or unspecified chronic kidney disease: Secondary | ICD-10-CM | POA: Diagnosis not present

## 2021-08-23 DIAGNOSIS — N186 End stage renal disease: Secondary | ICD-10-CM | POA: Diagnosis not present

## 2021-08-23 DIAGNOSIS — D631 Anemia in chronic kidney disease: Secondary | ICD-10-CM | POA: Diagnosis not present

## 2021-08-23 DIAGNOSIS — N2581 Secondary hyperparathyroidism of renal origin: Secondary | ICD-10-CM | POA: Diagnosis not present

## 2021-08-23 DIAGNOSIS — Z992 Dependence on renal dialysis: Secondary | ICD-10-CM | POA: Diagnosis not present

## 2021-08-23 DIAGNOSIS — D649 Anemia, unspecified: Secondary | ICD-10-CM | POA: Diagnosis not present

## 2021-08-23 DIAGNOSIS — E1122 Type 2 diabetes mellitus with diabetic chronic kidney disease: Secondary | ICD-10-CM | POA: Diagnosis not present

## 2021-08-24 ENCOUNTER — Encounter: Payer: Self-pay | Admitting: Podiatry

## 2021-08-24 ENCOUNTER — Other Ambulatory Visit: Payer: Self-pay

## 2021-08-24 ENCOUNTER — Ambulatory Visit (INDEPENDENT_AMBULATORY_CARE_PROVIDER_SITE_OTHER): Payer: Medicare Other | Admitting: Podiatry

## 2021-08-24 DIAGNOSIS — E0842 Diabetes mellitus due to underlying condition with diabetic polyneuropathy: Secondary | ICD-10-CM

## 2021-08-24 DIAGNOSIS — L84 Corns and callosities: Secondary | ICD-10-CM

## 2021-08-24 DIAGNOSIS — B351 Tinea unguium: Secondary | ICD-10-CM | POA: Diagnosis not present

## 2021-08-25 DIAGNOSIS — D631 Anemia in chronic kidney disease: Secondary | ICD-10-CM | POA: Diagnosis not present

## 2021-08-25 DIAGNOSIS — N2581 Secondary hyperparathyroidism of renal origin: Secondary | ICD-10-CM | POA: Diagnosis not present

## 2021-08-25 DIAGNOSIS — N186 End stage renal disease: Secondary | ICD-10-CM | POA: Diagnosis not present

## 2021-08-25 DIAGNOSIS — D509 Iron deficiency anemia, unspecified: Secondary | ICD-10-CM | POA: Diagnosis not present

## 2021-08-25 DIAGNOSIS — Z992 Dependence on renal dialysis: Secondary | ICD-10-CM | POA: Diagnosis not present

## 2021-08-27 ENCOUNTER — Encounter: Payer: Self-pay | Admitting: Internal Medicine

## 2021-08-27 DIAGNOSIS — N186 End stage renal disease: Secondary | ICD-10-CM | POA: Diagnosis not present

## 2021-08-27 DIAGNOSIS — Z992 Dependence on renal dialysis: Secondary | ICD-10-CM | POA: Diagnosis not present

## 2021-08-27 DIAGNOSIS — D631 Anemia in chronic kidney disease: Secondary | ICD-10-CM | POA: Diagnosis not present

## 2021-08-27 DIAGNOSIS — N2581 Secondary hyperparathyroidism of renal origin: Secondary | ICD-10-CM | POA: Diagnosis not present

## 2021-08-27 DIAGNOSIS — D509 Iron deficiency anemia, unspecified: Secondary | ICD-10-CM | POA: Diagnosis not present

## 2021-08-28 NOTE — Progress Notes (Signed)
Subjective: Patient presents today at risk foot care with h/o NIDDM with ESRD on hemodialysis, painful thick toenails that are difficult to trim. Pain interferes with ambulation. Aggravating factors include wearing enclosed shoe gear. Pain is relieved with periodic professional debridement., and follow up of ulceration to the submet head 4 right foot.   He states he has not been able to get an appointment. He notes discomfort of right foot today. Denies any redness, drainage or swelling. Denies any fever, chills, night sweats, nausea or vomiting. Lesion has grown quite a bit. He has been applying Iodosorb Gel to area once daily.   He states his blood glucose was 157 mg/dl this morning.    He states he has seen his doctor and now his pain is controlled with Percocet 5/325mg.  Miller, Stephen M, MD is patient's PCP. Last visit was 09/247/2022.  Current Outpatient Medications on File Prior to Visit  Medication Sig Dispense Refill   acetaminophen (TYLENOL) 650 MG CR tablet Take 1,300 mg by mouth 3 (three) times daily.     allopurinol (ZYLOPRIM) 100 MG tablet TAKE 1 TABLET(100 MG) BY MOUTH DAILY 90 tablet 2   ANORO ELLIPTA 62.5-25 MCG/INH AEPB INHALE 1 PUFF BY MOUTH EVERY DAY 60 each 5   atorvastatin (LIPITOR) 10 MG tablet Take 1 tablet (10 mg total) by mouth daily. 90 tablet 3   B-D UF III MINI PEN NEEDLES 31G X 5 MM MISC USE FOUR TIMES DAILY 100 each 11   bismuth-metronidazole-tetracycline (PYLERA) 140-125-125 MG capsule Take 3 capsules by mouth 4 (four) times daily -  before meals and at bedtime. 168 capsule 0   Calcium Acetate 667 MG TABS Take 667-1,334 mg by mouth See admin instructions. Take 1,334 mg by mouth three times a day with meals and 667 mg with each snack     carvedilol (COREG) 3.125 MG tablet TAKE 1 TABLET(3.125 MG) BY MOUTH TWICE DAILY 180 tablet 3   colchicine 0.6 MG tablet TAKE 1 TABLET BY MOUTH AS NEEDED FOR GOUT 30 tablet 1   diclofenac Sodium (VOLTAREN) 1 % GEL APPLY 2 GRAMS  TOPICALLY TO THE AFFECTED AREA THREE TIMES DAILY AS NEEDED FOR PAIN 600 g 3   folic acid (FOLVITE) 1 MG tablet TAKE 1 TABLET(1 MG) BY MOUTH DAILY 90 tablet 2   gabapentin (NEURONTIN) 300 MG capsule TAKE 1 CAPSULE BY MOUTH DAILY. DOSE CAHNGE DUE TO RENAL FUNCTION 90 capsule 2   glucose blood (ONETOUCH ULTRA) test strip Check blood sugar three times daily. Dx:E11.40 300 strip 3   insulin lispro (HUMALOG) 100 UNIT/ML KwikPen INJECT 13 UNITS UNDER THE SKIN THREE TIMES DAILY AS NEEDED FOR HIGH BLOOD SUGAR(ABOVE 150) 45 mL 1   isosorbide mononitrate (IMDUR) 30 MG 24 hr tablet TAKE 1 TABLET BY MOUTH EVERY DAY 90 tablet 2   Lancets (ONETOUCH DELICA PLUS LANCET33G) MISC 1 Device by Other route 3 (three) times daily. E11.42 300 each 1   latanoprost (XALATAN) 0.005 % ophthalmic solution Place 1 drop into both eyes at bedtime.     lidocaine-prilocaine (EMLA) cream Apply 1 application topically every Monday, Wednesday, and Friday with hemodialysis.     Methoxy PEG-Epoetin Beta (MIRCERA IJ) Mircera     multivitamin (RENA-VIT) TABS tablet Take 1 tablet by mouth daily.     nitroGLYCERIN (NITROSTAT) 0.3 MG SL tablet PLACE 1 TABLET UNDER THE TONGUE AS NEEDED FOR CHEST PAIN 75 tablet 2   octreotide (SANDOSTATIN LAR) 10 MG injection Inject 10 mg into the muscle every 28 (  twenty-eight) days. 1 kit 3   ondansetron (ZOFRAN) 4 MG tablet Take 1 tablet (4 mg total) by mouth every 8 (eight) hours as needed for nausea or vomiting. 20 tablet 0   oxyCODONE-acetaminophen (PERCOCET/ROXICET) 5-325 MG tablet Take 1 tablet by mouth every 4 (four) hours as needed for severe pain. 30 tablet 0   pantoprazole (PROTONIX) 40 MG tablet Take 1 tablet (40 mg total) by mouth 2 (two) times daily before a meal. 60 tablet 2   polyethylene glycol (MIRALAX / GLYCOLAX) 17 g packet Take 17 g by mouth daily as needed for mild constipation. 14 each 0   timolol (TIMOPTIC) 0.5 % ophthalmic solution Place 1 drop into both eyes in the morning.     VITAMIN  D PO Take by mouth.     No current facility-administered medications on file prior to visit.     Allergies  Allergen Reactions   Augmentin [Amoxicillin-Pot Clavulanate] Diarrhea and Other (See Comments)    Severe diarrhea   Mucinex Fast-Max Other (See Comments)    Intense sweating    Amphetamines Other (See Comments)    Unknown reaction     Objective: There were no vitals filed for this visit.  Jacob [name] is a pleasant 70 y.o. male WD, WN in NAD.[name] AAO X 3.  Vascular Examination: CFT <5 seconds b/l LE. Palpable DP pulse(s) b/l lower extremities Palpable PT pulse(s) b/l lower extremities Pedal hair absent. No pain with calf compression b/l. Lower extremity skin temperature gradient within normal limits. No edema noted b/l LE.  Dermatological Examination: Pedal skin thin and atrophic b/l LE. No interdigital macerations noted b/l LE. Toenails 1-5 b/l elongated, discolored, dystrophic, thickened, crumbly with subungual debris and tenderness to dorsal palpation. Hyperkeratotic lesion(s) L hallux.  No erythema, no edema, no drainage, no fluctuance.  Wound Location: submet head 4 right foot There is a moderate amount of devitalized tissue present in the wound. Predebridement Wound Measurement:  1.0  x 1.0 cm Postdebridement Wound Measurement: No wound present. Wound Base: Epithelialized Peri-wound: Normal Exudate: None: wound tissue dry Blood Loss during debridement: 0 cc('s). Material in wound which inhibits healing/promotes adjacent tissue breakdown:  nonviable hyperkeratosis. Description of tissue removed from ulceration today:  nonviable hyperkeratosis. Sign(s) of clinical bacterial infection: no clinical signs of infection noted on examination today.   Musculoskeletal Examination: Normal muscle strength 5/5 to all lower extremity muscle groups bilaterally. HAV with bunion deformity noted b/l LE. Hammertoe deformity noted 2-5 b/l.  Neurological Examination: Protective  sensation diminished with 10g monofilament b/l. Vibratory sensation diminished b/l.  Assessment and Plan 1. Onychomycosis   2. Pre-ulcerative calluses   3. Callus   4. Diabetes mellitus due to underlying condition with diabetic polyneuropathy, unspecified whether long term insulin use ([place])     -Patient was evaluated and treated and all questions answered.  -Patient/POA/Family member educated on diagnosis and treatment plan of routine ulcer debridement/wound care.  -Examined patient. -No new findings. No new orders. -Toenails 1-5 b/l were debrided in length and girth with sterile nail nippers and dremel without iatrogenic bleeding.  -Callus(es) L hallux pared utilizing sterile scalpel blade without complication or incident. Total number debrided =1. -Preulcerative lesion pared submet head 4 right foot. Total number pared=1. -Patient/POA to call should there be question/concern in the interim.  Return in about 6 weeks (around 10/05/2021) for right foot lesion.  Marzetta Board, DPM

## 2021-08-30 ENCOUNTER — Other Ambulatory Visit (HOSPITAL_COMMUNITY): Payer: Self-pay

## 2021-08-30 DIAGNOSIS — N2581 Secondary hyperparathyroidism of renal origin: Secondary | ICD-10-CM | POA: Diagnosis not present

## 2021-08-30 DIAGNOSIS — D631 Anemia in chronic kidney disease: Secondary | ICD-10-CM | POA: Diagnosis not present

## 2021-08-30 DIAGNOSIS — D509 Iron deficiency anemia, unspecified: Secondary | ICD-10-CM | POA: Diagnosis not present

## 2021-08-30 DIAGNOSIS — N186 End stage renal disease: Secondary | ICD-10-CM | POA: Diagnosis not present

## 2021-08-30 DIAGNOSIS — Z992 Dependence on renal dialysis: Secondary | ICD-10-CM | POA: Diagnosis not present

## 2021-08-31 ENCOUNTER — Other Ambulatory Visit (HOSPITAL_COMMUNITY): Payer: Self-pay

## 2021-09-01 ENCOUNTER — Other Ambulatory Visit (HOSPITAL_COMMUNITY): Payer: Self-pay

## 2021-09-01 DIAGNOSIS — N2581 Secondary hyperparathyroidism of renal origin: Secondary | ICD-10-CM | POA: Diagnosis not present

## 2021-09-01 DIAGNOSIS — N186 End stage renal disease: Secondary | ICD-10-CM | POA: Diagnosis not present

## 2021-09-01 DIAGNOSIS — D631 Anemia in chronic kidney disease: Secondary | ICD-10-CM | POA: Diagnosis not present

## 2021-09-01 DIAGNOSIS — Z992 Dependence on renal dialysis: Secondary | ICD-10-CM | POA: Diagnosis not present

## 2021-09-01 DIAGNOSIS — D509 Iron deficiency anemia, unspecified: Secondary | ICD-10-CM | POA: Diagnosis not present

## 2021-09-02 ENCOUNTER — Ambulatory Visit (HOSPITAL_COMMUNITY)
Admission: RE | Admit: 2021-09-02 | Discharge: 2021-09-02 | Disposition: A | Discharge status: Home / Self Care | Payer: Medicare Other | Source: Ambulatory Visit | Attending: Gastroenterology | Admitting: Gastroenterology

## 2021-09-02 ENCOUNTER — Other Ambulatory Visit: Payer: Self-pay

## 2021-09-02 DIAGNOSIS — Q2730 Arteriovenous malformation, site unspecified: Secondary | ICD-10-CM | POA: Diagnosis not present

## 2021-09-02 MED ORDER — OCTREOTIDE ACETATE 10 MG IM KIT
10.0000 mg | PACK | INTRAMUSCULAR | Status: DC
  Administered 2021-09-02: 10 mg via INTRAMUSCULAR
  Filled 2021-09-02: qty 1

## 2021-09-03 DIAGNOSIS — N2581 Secondary hyperparathyroidism of renal origin: Secondary | ICD-10-CM | POA: Diagnosis not present

## 2021-09-03 DIAGNOSIS — D631 Anemia in chronic kidney disease: Secondary | ICD-10-CM | POA: Diagnosis not present

## 2021-09-03 DIAGNOSIS — N186 End stage renal disease: Secondary | ICD-10-CM | POA: Diagnosis not present

## 2021-09-03 DIAGNOSIS — D509 Iron deficiency anemia, unspecified: Secondary | ICD-10-CM | POA: Diagnosis not present

## 2021-09-03 DIAGNOSIS — Z992 Dependence on renal dialysis: Secondary | ICD-10-CM | POA: Diagnosis not present

## 2021-09-06 DIAGNOSIS — D631 Anemia in chronic kidney disease: Secondary | ICD-10-CM | POA: Diagnosis not present

## 2021-09-06 DIAGNOSIS — D509 Iron deficiency anemia, unspecified: Secondary | ICD-10-CM | POA: Diagnosis not present

## 2021-09-06 DIAGNOSIS — N2581 Secondary hyperparathyroidism of renal origin: Secondary | ICD-10-CM | POA: Diagnosis not present

## 2021-09-06 DIAGNOSIS — N186 End stage renal disease: Secondary | ICD-10-CM | POA: Diagnosis not present

## 2021-09-06 DIAGNOSIS — Z992 Dependence on renal dialysis: Secondary | ICD-10-CM | POA: Diagnosis not present

## 2021-09-07 ENCOUNTER — Other Ambulatory Visit: Payer: Self-pay

## 2021-09-07 ENCOUNTER — Encounter: Payer: Self-pay | Admitting: Internal Medicine

## 2021-09-07 ENCOUNTER — Ambulatory Visit (INDEPENDENT_AMBULATORY_CARE_PROVIDER_SITE_OTHER): Payer: Medicare Other | Admitting: Internal Medicine

## 2021-09-07 DIAGNOSIS — B20 Human immunodeficiency virus [HIV] disease: Secondary | ICD-10-CM

## 2021-09-07 DIAGNOSIS — A048 Other specified bacterial intestinal infections: Secondary | ICD-10-CM | POA: Diagnosis not present

## 2021-09-07 DIAGNOSIS — D649 Anemia, unspecified: Secondary | ICD-10-CM

## 2021-09-07 NOTE — Assessment & Plan Note (Signed)
His HIV infection remains under excellent, long-term control.  He will continue Biktarvy and follow-up after lab work in 6 months. 

## 2021-09-07 NOTE — Progress Notes (Signed)
Patient Active Problem List   Diagnosis Date Noted   Helicobacter pylori (H. pylori) infection 04/06/2021    Priority: High   Human immunodeficiency virus (HIV) disease ([place]) 09/02/2006    Priority: High   Thrombocytopenia (HCC)    Hypercalcemia 04/13/2021   Lumbar foraminal stenosis 03/30/2021   Gastric hemorrhage due to angiodysplasia of stomach    GI bleed 11/24/2020   COVID-19 virus infection 11/24/2020   Other mechanical complication of cranial or spinal infusion catheter, sequela 11/11/2020   Subdural hematoma 11/06/2020   Fall at home, initial encounter 11/05/2020   Nicotine dependence, cigarettes, uncomplicated 25/95/6387   Alcohol abuse 11/05/2020   Unspecified trochanteric fracture of left femur, initial encounter for closed fracture ([place]) 11/05/2020   Traumatic subdural hematoma, initial encounter 11/05/2020   Fracture of metatarsal of right foot, closed 11/05/2020   Nondisplaced fracture of greater trochanter of left femur, initial encounter for closed fracture ([place])    Uremia    Angiodysplasia of intestine with hemorrhage    Pruritus, unspecified 10/02/2020   Iron deficiency anemia    Acute blood loss anemia    Acute on chronic anemia    Abnormal nuclear stress test 12/19/2019   Type 2 diabetes mellitus with diabetic neuropathy, unspecified ([place]) 08/07/2019   Allergy, unspecified, initial encounter 56/43/3295   Complication of vascular dialysis catheter 08/02/2019   Drug abuse counseling and surveillance of drug abuser 08/02/2019   Other specified coagulation defects ([place]) 08/02/2019   Secondary hyperparathyroidism of renal origin ([place]) 08/02/2019   Unspecified atrial fibrillation ([place]) 08/02/2019   ESRD on hemodialysis ([place])    Cocaine abuse ([place])    CHF exacerbation ([place]) 07/21/2019   Diabetic foot ulcer ([place]) 02/11/2019   AVM (arteriovenous malformation)    Melena 02/07/2019   Symptomatic anemia 02/06/2019   Hyperlipidemia associated with type  2 diabetes mellitus ([place]) 05/30/2018   Right foot ulcer ([place]) 02/28/2018   Chronic obstructive pulmonary disease ([place]) 02/28/2018   Anemia of chronic disease 11/20/2016   CHF (congestive heart failure) ([place]) 11/10/2016   History of lumbar laminectomy for spinal cord decompression 02/29/2016   Type 2 diabetes mellitus with diabetic polyneuropathy, with long-term current use of insulin ([place]) 06/17/2015   HTN (hypertension) 04/26/2015   DM (diabetes mellitus) ([place]) 04/26/2015   Ischemic cardiomyopathy 05/12/2014   Shock ([place]) 09/06/2013   Bunion of left foot 11/25/2011   Bunion, right foot 11/25/2011   Polysubstance abuse ([place]) 07/28/2011   Hip fracture, left ([place]) 04/04/2011   Closed fracture of neck of femur ([place]) 04/04/2011   Insomnia 02/18/2011   Coronary artery disease involving native heart without angina pectoris 04/20/2009   Chronic combined systolic (congestive) and diastolic (congestive) heart failure ([place]) 04/20/2009   Mixed hyperlipidemia 11/20/2006   Gout 11/20/2006   TOBACCO ABUSE 11/20/2006   Essential hypertension 11/20/2006    Patient's Medications  New Prescriptions   No medications on file  Previous Medications   ACETAMINOPHEN (TYLENOL) 650 MG CR TABLET    Take 1,300 mg by mouth 3 (three) times daily.   ALLOPURINOL (ZYLOPRIM) 100 MG TABLET    TAKE 1 TABLET(100 MG) BY MOUTH DAILY   ANORO ELLIPTA 62.5-25 MCG/INH AEPB    INHALE 1 PUFF BY MOUTH EVERY DAY   ATORVASTATIN (LIPITOR) 10 MG TABLET    Take 1 tablet (10 mg total) by mouth daily.   B-D UF III MINI PEN NEEDLES 31G X 5 MM MISC    USE FOUR TIMES  DAILY   BIKTARVY 50-200-25 MG TABS TABLET    Take 1 tablet by mouth daily.   BISMUTH-METRONIDAZOLE-TETRACYCLINE (PYLERA) 140-125-125 MG CAPSULE    Take 3 capsules by mouth 4 (four) times daily -  before meals and at bedtime.   CALCIUM ACETATE 667 MG TABS    Take 667-1,334 mg by mouth See admin instructions. Take 1,334 mg by mouth three times a day with meals and 667 mg  with each snack   CARVEDILOL (COREG) 3.125 MG TABLET    TAKE 1 TABLET(3.125 MG) BY MOUTH TWICE DAILY   COLCHICINE 0.6 MG TABLET    TAKE 1 TABLET BY MOUTH AS NEEDED FOR GOUT   DICLOFENAC SODIUM (VOLTAREN) 1 % GEL    APPLY 2 GRAMS TOPICALLY TO THE AFFECTED AREA THREE TIMES DAILY AS NEEDED FOR PAIN   FOLIC ACID (FOLVITE) 1 MG TABLET    TAKE 1 TABLET(1 MG) BY MOUTH DAILY   GABAPENTIN (NEURONTIN) 300 MG CAPSULE    TAKE 1 CAPSULE BY MOUTH DAILY. DOSE CAHNGE DUE TO RENAL FUNCTION   GLUCOSE BLOOD (ONETOUCH ULTRA) TEST STRIP    Check blood sugar three times daily. Dx:E11.40   INSULIN LISPRO (HUMALOG) 100 UNIT/ML KWIKPEN    INJECT 13 UNITS UNDER THE SKIN THREE TIMES DAILY AS NEEDED FOR HIGH BLOOD SUGAR(ABOVE 150)   ISOSORBIDE MONONITRATE (IMDUR) 30 MG 24 HR TABLET    TAKE 1 TABLET BY MOUTH EVERY DAY   LANCETS (ONETOUCH DELICA PLUS [id number]) MISC    1 Device by Other route 3 (three) times daily. E11.42   LATANOPROST (XALATAN) 0.005 % OPHTHALMIC SOLUTION    Place 1 drop into both eyes at bedtime.   LIDOCAINE-PRILOCAINE (EMLA) CREAM    Apply 1 application topically every Monday, Wednesday, and Friday with hemodialysis.   METHOXY PEG-EPOETIN BETA (MIRCERA IJ)    Mircera   MULTIVITAMIN (RENA-VIT) TABS TABLET    Take 1 tablet by mouth daily.   NITROGLYCERIN (NITROSTAT) 0.3 MG SL TABLET    PLACE 1 TABLET UNDER THE TONGUE AS NEEDED FOR CHEST PAIN   OCTREOTIDE (SANDOSTATIN LAR) 10 MG INJECTION    Inject 10 mg into the muscle every 28 (twenty-eight) days.   ONDANSETRON (ZOFRAN) 4 MG TABLET    Take 1 tablet (4 mg total) by mouth every 8 (eight) hours as needed for nausea or vomiting.   OXYCODONE-ACETAMINOPHEN (PERCOCET/ROXICET) 5-325 MG TABLET    Take 1 tablet by mouth every 4 (four) hours as needed for severe pain.   PANTOPRAZOLE (PROTONIX) 40 MG TABLET    Take 1 tablet (40 mg total) by mouth 2 (two) times daily before a meal.   POLYETHYLENE GLYCOL (MIRALAX / GLYCOLAX) 17 G PACKET    Take 17 g by mouth daily as  needed for mild constipation.   TIMOLOL (TIMOPTIC) 0.5 % OPHTHALMIC SOLUTION    Place 1 drop into both eyes in the morning.   VITAMIN D PO    Take by mouth.  Modified Medications   No medications on file  Discontinued Medications   No medications on file    Subjective: Jacob [name] is in for his routine follow-up visit.  He denies any problems obtaining, taking or tolerating his Biktarvy and does not recall missing doses.  He completed his third round of treatment for H. pylori in early September.  He was on a 2-week regimen consisting of bismuth, tetracycline, metronidazole and pantoprazole.  He is not sure if he is still on the pantoprazole.  He denies having heartburn but  says he has been having a lot of "gas" recently.  He tells me that his hemoglobin has come up to 10 recently.  He has received his annual influenza vaccine and a recent COVID booster.  Review of Systems: Review of Systems  Constitutional:  Negative for chills, diaphoresis, fever, malaise/fatigue and weight loss.  HENT:  Negative for sore throat.   Respiratory:  Negative for cough, sputum production and shortness of breath.   Cardiovascular:  Negative for chest pain.  Gastrointestinal:  Negative for abdominal pain, diarrhea, heartburn, nausea and vomiting.       As noted in HPI.  Genitourinary:  Negative for dysuria and frequency.  Musculoskeletal:  Positive for back pain. Negative for joint pain and myalgias.  Skin:  Negative for rash.  Neurological:  Negative for dizziness and headaches.  Psychiatric/Behavioral:  Negative for depression and substance abuse. The patient is not nervous/anxious.        Overall he feels like his mood swings are better.   Past Medical History:  Diagnosis Date   Acute respiratory failure ([place]) 03/01/2018   Anemia    Arthritis    "all over; mostly knees and back" (02/28/2018)   Chronic combined systolic and diastolic CHF (congestive heart failure) (HCC)    Chronic lower back pain    stenosis    Community acquired pneumonia 09/06/2013   COPD (chronic obstructive pulmonary disease) ([place])    Coronary atherosclerosis of native coronary artery    a. 02/2003 s/p CABG x 2 (VG->RI, VG->RPDA; b. 11/2019 PCI: LM nl, LAD 90d, D3 50, RI 100, LCX 100p, OM3 100 - fills via L->L collats from D2/dLAD, RCA 100p, VG->RPDA ok, VG->RI 95 (3.5x48 Synergy XD DES).   Drug abuse ([place])    hx; tested for cocaine as recently as 2/08. says he is not using drugs now - avoided defib. for this reason    ESRD (end stage renal disease) ([place])    Hemo M-W-F- [facility]   Fall at home 10/2020   GERD (gastroesophageal reflux disease)    takes OTC meds as needed   GI bleeding    a. 11/2019 EGD: angiodysplastic lesions w/ bleeding s/p argon plasma/clipping/epi inj. Multiple admissions for the same.   Glaucoma    uses eye drops daily   Hepatitis B 1968   "tx'd w/isolation; caught it from toilet stools in gym"   History of blood transfusion 03/01/2019   History of colon polyps    benign   History of gout    takes Allopurinol daily as well as Colchicine-if needed (02/28/2018)   History of kidney stones    HTN (hypertension)    takes Coreg,Imdur.and Apresoline daily   Human immunodeficiency virus (HIV) disease ([place]) dx'd 1995   on [place] as of 12/2020.     Hyperlipidemia    Ischemic cardiomyopathy    a. 01/2019 Echo: EF 40-45%, diffuse HK, mild basal septal hypertrophy. Diast dysfxn. Nl RV size/fxn. Sev dil LA. Triv MR/TR/PR.   Muscle spasm    takes Zanaflex as needed   Myocardial infarction ([place]) ~ 2004/2005   Nocturia    Peripheral neuropathy    takes gabapentin daily   Pneumonia    "at least twice" (02/28/2018)   SDH (subdural hematoma)    Syphilis, unspecified    Type II diabetes mellitus ([place]) 2004   Lantus daily.Average fasting blood sugar 125-199   Wears glasses    Wears partial dentures     Social History   Tobacco Use  Smoking status: Every Day    Packs/day: 0.50    Years: 43.00    Pack years:  21.50    Types: Cigarettes   Smokeless tobacco: Never  Vaping Use   Vaping Use: Never used  Substance Use Topics   Alcohol use: Not Currently    Alcohol/week: 12.0 standard drinks    Types: 12 Standard drinks or equivalent per week    Comment: occassional   Drug use: Not Currently    Types: Cocaine    Comment: hx of crack/cocaine 31yrs ago 10/01/2019- none    Family History  Problem Relation Age of Onset   Heart failure Father    Hypertension Father    Diabetes Brother    Heart attack Brother    Alzheimer's disease Mother    Stroke Sister    Diabetes Sister    Alzheimer's disease Sister    Hypertension Brother    Diabetes Brother    Drug abuse Brother    Colon cancer Neg Hx     Allergies  Allergen Reactions   Augmentin [Amoxicillin-Pot Clavulanate] Diarrhea and Other (See Comments)    Severe diarrhea   Mucinex Fast-Max Other (See Comments)    Intense sweating    Amphetamines Other (See Comments)    Unknown reaction    Health Maintenance  Topic Date Due   OPHTHALMOLOGY EXAM  10/07/2020   FOOT EXAM  12/30/2020   HEMOGLOBIN A1C  08/03/2021   LIPID PANEL  05/13/2022   COLONOSCOPY (Pts 45-72yrs Insurance coverage will need to be confirmed)  10/01/2022   TETANUS/TDAP  06/13/2029   Pneumonia Vaccine 58+ Years old  Completed   INFLUENZA VACCINE  Completed   COVID-19 Vaccine  Completed   Hepatitis C Screening  Completed   Zoster Vaccines- Shingrix  Completed   HPV VACCINES  Aged Out    Objective:  Vitals:   09/07/21 1336  BP: 137/68  Pulse: 76  Temp: 97.7 F (36.5 C)  TempSrc: Temporal  Weight: 178 lb (80.7 kg)   Body mass index is 24.83 kg/m.  Physical Exam Constitutional:      Comments: He is in good spirits.  Cardiovascular:     Rate and Rhythm: Normal rate and regular rhythm.     Heart sounds: No murmur heard. Pulmonary:     Effort: Pulmonary effort is normal.     Breath sounds: Normal breath sounds.  Abdominal:     Palpations: Abdomen is  soft.     Tenderness: There is no abdominal tenderness.  Neurological:     General: No focal deficit present.  Psychiatric:        Mood and Affect: Mood normal.    Lab Results Lab Results  Component Value Date   WBC 4.7 07/08/2021   HGB 7.9 (L) 07/08/2021   HCT 25.6 (L) 07/08/2021   MCV 97.3 07/08/2021   PLT 214 07/08/2021    Lab Results  Component Value Date   CREATININE 6.74 (H) 07/08/2021   BUN 51 (H) 07/08/2021   NA 131 (L) 07/08/2021   K 4.3 07/08/2021   CL 91 (L) 07/08/2021   CO2 27 07/08/2021    Lab Results  Component Value Date   ALT 18 06/23/2021   AST 22 06/23/2021   ALKPHOS 60 06/23/2021   BILITOT 0.4 06/23/2021    Lab Results  Component Value Date   CHOL 111 05/13/2021   HDL 32 (L) 05/13/2021   LDLCALC 58 05/13/2021   TRIG 111 05/13/2021   CHOLHDL 3.5 05/13/2021  Lab Results  Component Value Date   LABRPR REACTIVE (A) 06/30/2020   RPRTITER 1:1 (H) 06/30/2020   HIV 1 RNA Quant  Date Value  04/06/2021 <20 Copies/mL (H)  06/30/2020 <20 Copies/mL  06/24/2019 <20 NOT DETECTED copies/mL   CD4 T Cell Abs (/uL)  Date Value  04/06/2021 406  06/30/2020 403  06/24/2019 927     Problem List Items Addressed This Visit       High   Human immunodeficiency virus (HIV) disease ([place]) (Chronic)    His HIV infection remains under excellent, long-term control.  He will continue Biktarvy and follow-up after lab work in 6 months.      Relevant Medications   BIKTARVY 50-200-25 MG TABS tablet   Other Relevant Orders   T-helper cell (CD4)- ([facility] clinic only)   HIV-1 RNA quant-no reflex-bld   CBC   Comprehensive metabolic panel   Lipid panel   RPR   Helicobacter pylori (H. pylori) infection    I instructed him to make sure that he is not taking pantoprazole.  He will have a follow-up H. pylori breath test in 1 month.      Relevant Medications   BIKTARVY 50-200-25 MG TABS tablet   Other Relevant Orders   H. pylori breath test     Unprioritized    Acute on chronic anemia    His anemia is responding to recent treatment for H. pylori and iron supplements.         [name], MD [facility] [place] [facility] [phone] pager   [phone] cell 09/07/2021, 2:14 PM

## 2021-09-07 NOTE — Assessment & Plan Note (Signed)
I instructed him to make sure that he is not taking pantoprazole.  He will have a follow-up H. pylori breath test in 1 month.

## 2021-09-07 NOTE — Assessment & Plan Note (Signed)
His anemia is responding to recent treatment for H. pylori and iron supplements.

## 2021-09-08 DIAGNOSIS — D509 Iron deficiency anemia, unspecified: Secondary | ICD-10-CM | POA: Diagnosis not present

## 2021-09-08 DIAGNOSIS — N186 End stage renal disease: Secondary | ICD-10-CM | POA: Diagnosis not present

## 2021-09-08 DIAGNOSIS — Z992 Dependence on renal dialysis: Secondary | ICD-10-CM | POA: Diagnosis not present

## 2021-09-08 DIAGNOSIS — N2581 Secondary hyperparathyroidism of renal origin: Secondary | ICD-10-CM | POA: Diagnosis not present

## 2021-09-08 DIAGNOSIS — D631 Anemia in chronic kidney disease: Secondary | ICD-10-CM | POA: Diagnosis not present

## 2021-09-09 ENCOUNTER — Telehealth: Payer: Self-pay

## 2021-09-09 DIAGNOSIS — M48061 Spinal stenosis, lumbar region without neurogenic claudication: Secondary | ICD-10-CM

## 2021-09-09 MED FILL — Octreotide Acetate For IM Inj Kit 10 MG: INTRAMUSCULAR | Qty: 1 | Status: AC

## 2021-09-09 NOTE — Telephone Encounter (Signed)
Patient called to seek clarity on when he needs to call for a refill on his oxycodone due on 09/17/2021 since the holiday is coming.  Patient instructed to call on Wednesday for refill request due to office being closed on Thursday and Friday in observance for Thanksgiving

## 2021-09-09 NOTE — Progress Notes (Signed)
Cardiology Office Note    Date:  09/21/2021   ID:  Jacob [name], DOB 05/16/1951, MRN [id number]  PCP:  [name], MD  Cardiologist:  Jenkins Rouge, MD  Electrophysiologist:  None   Chief Complaint: f/u CAD  History of Present Illness:   Jacob [name] is a 71 y.o. male with history of coronary artery disease status post CABG x2 in 2004, DES to VG-RI in 11/2019, ischemic cardiomyopathy/chronic combined CHF, end-stage renal disease on hemodialysis, HIV, hypertension, hyperlipidemia, type 2 diabetes, depression, GI bleeding requiring transfusions - recurrent due to AVMs, anemia previously treated with IV iron, SDH, tobacco abuse, prior cocaine use, RBBB, peripheral neuropathy, gout, GERD.   Last echo 09/2020 showed EF 40-45%, grade 2 DD, moderately dilated LV, mod LVH, normal PASP, mild MR, moderately dilated LAE. Started hemodialysis October 2020. Since that time he has had 6 additional medical admissions for multiple issues including small SDH and left femur fx following a fall, incidental Covid-19, and recurrent UGIB/anemia requiring tranfusions, several EGDs, and AVMs s/p clipping several times. After the admission in January for SDH, his Jacob [name] was resumed. Cardiology was not consulted during that admission. Brilinta was no longer on his list in March. He has since been on ASA periodically interrupted for his GI bleeding. During some of these admission he left AMA. His last admission was 4/22-4/26 for recurrent GIB again requring transfusion/EGD. He refused SNF and declined to stay in the hospital any longer per DC summary.  He is most bothered by chronic right low back pain/hip pain worse at rest than ambulation. He is seeing orthopedics for this and is taking Tramadol but does not feel it works as well as Oxycodone. ? Needing lumbar fusion with Dr Louanne Skye but this seems to have been tabled for now  No cardiac complaints Hb better 11.4 Oxycodone helping back pain    Labwork  independently reviewed: 02/2021 Hgb 10.4, K 4.1, Cr 8.0, LFTs ok 11/2020 LDL 79 ([facility]) 11/2019 LDL 29   Past Medical History:  Diagnosis Date   Acute respiratory failure ([place]) 03/01/2018   Anemia    Arthritis    "all over; mostly knees and back" (02/28/2018)   Chronic combined systolic and diastolic CHF (congestive heart failure) (HCC)    Chronic lower back pain    stenosis   Community acquired pneumonia 09/06/2013   COPD (chronic obstructive pulmonary disease) ([place])    Coronary atherosclerosis of native coronary artery    a. 02/2003 s/p CABG x 2 (VG->RI, VG->RPDA; b. 11/2019 PCI: LM nl, LAD 90d, D3 50, RI 100, LCX 100p, OM3 100 - fills via L->L collats from D2/dLAD, RCA 100p, VG->RPDA ok, VG->RI 95 (3.5x48 Synergy XD DES).   Drug abuse ([place])    hx; tested for cocaine as recently as 2/08. says he is not using drugs now - avoided defib. for this reason    ESRD (end stage renal disease) ([place])    Hemo M-W-F- [facility]   Fall at home 10/2020   GERD (gastroesophageal reflux disease)    takes OTC meds as needed   GI bleeding    a. 11/2019 EGD: angiodysplastic lesions w/ bleeding s/p argon plasma/clipping/epi inj. Multiple admissions for the same.   Glaucoma    uses eye drops daily   Hepatitis B 1968   "tx'd w/isolation; caught it from toilet stools in gym"   History of blood transfusion 03/01/2019   History of colon polyps    benign   History of gout  takes Allopurinol daily as well as Colchicine-if needed (02/28/2018)   History of kidney stones    HTN (hypertension)    takes Coreg,Imdur.and Apresoline daily   Human immunodeficiency virus (HIV) disease ([place]) dx'd 1995   on [facility] as of 12/2020.     Hyperlipidemia    Ischemic cardiomyopathy    a. 01/2019 Echo: EF 40-45%, diffuse HK, mild basal septal hypertrophy. Diast dysfxn. Nl RV size/fxn. Sev dil LA. Triv MR/TR/PR.   Muscle spasm    takes Zanaflex as needed   Myocardial infarction ([place]) ~ 2004/2005   Nocturia    Peripheral  neuropathy    takes gabapentin daily   Pneumonia    "at least twice" (02/28/2018)   SDH (subdural hematoma)    Syphilis, unspecified    Type II diabetes mellitus ([place]) 2004   Lantus daily.Average fasting blood sugar 125-199   Wears glasses    Wears partial dentures     Past Surgical History:  Procedure Laterality Date   AV FISTULA PLACEMENT Left 08/02/2018   Procedure: ARTERIOVENOUS (AV) FISTULA CREATION  left arm radiocephlic;  Surgeon: Marty Heck, MD;  Location: [place];  Service: Vascular;  Laterality: Left;   AV FISTULA PLACEMENT Left 08/01/2019   Procedure: LEFT BRACHIOCEPHALIC ARTERIOVENOUS (AV) FISTULA CREATION;  Surgeon: [name], MD;  Location: [place];  Service: Vascular;  Laterality: Left;   [place] Left 10/03/2019   Procedure: BASILIC VEIN TRANSPOSITION LEFT SECOND STAGE;  Surgeon: [name], MD;  Location: [place];  Service: Vascular;  Laterality: Left;   BIOPSY  01/25/2021   Procedure: BIOPSY;  Surgeon: Doran Stabler, MD;  Location: [facility] ENDOSCOPY;  Service: Gastroenterology;;   CARDIAC CATHETERIZATION  10/2002; 12/19/2004   [name] 03/08/2011   COLONOSCOPY  2013   [place]    CORONARY ARTERY BYPASS GRAFT  02/24/2003   CABG X2/notes 03/08/2011   CORONARY STENT INTERVENTION N/A 12/19/2019   Procedure: CORONARY STENT INTERVENTION;  Surgeon: Jettie Booze, MD;  Location: [place] CV LAB;  Service: Cardiovascular;  Laterality: N/A;   ENTEROSCOPY N/A 01/25/2021   Procedure: ENTEROSCOPY;  Surgeon: Doran Stabler, MD;  Location: [place];  Service: Gastroenterology;  Laterality: N/A;   ENTEROSCOPY N/A 02/13/2021   Procedure: ENTEROSCOPY;  Surgeon: Jackquline Denmark, MD;  Location: [facility] ENDOSCOPY;  Service: Endoscopy;  Laterality: N/A;   ENTEROSCOPY N/A 05/07/2021   Procedure: ENTEROSCOPY;  Surgeon: Yetta Flock, MD;  [facility]  Service: Gastroenterology;  Laterality: N/A;   ESOPHAGOGASTRODUODENOSCOPY (EGD) WITH  PROPOFOL N/A 02/08/2019   Procedure: ESOPHAGOGASTRODUODENOSCOPY (EGD) WITH PROPOFOL;  Surgeon: Milus Banister, MD;  Location: [place];  Service: Gastroenterology;  Laterality: N/A;   ESOPHAGOGASTRODUODENOSCOPY (EGD) WITH PROPOFOL N/A 12/22/2019   Procedure: ESOPHAGOGASTRODUODENOSCOPY (EGD) WITH PROPOFOL;  Surgeon: Lavena Bullion, DO;  Location: [place];  Service: Gastroenterology;  Laterality: N/A;   ESOPHAGOGASTRODUODENOSCOPY (EGD) WITH PROPOFOL N/A 10/19/2020   Procedure: ESOPHAGOGASTRODUODENOSCOPY (EGD) WITH PROPOFOL;  Surgeon: Jackquline Denmark, MD;  Location: [facility] ENDOSCOPY;  Service: Endoscopy;  Laterality: N/A;   ESOPHAGOGASTRODUODENOSCOPY (EGD) WITH PROPOFOL N/A 12/22/2020   Procedure: ESOPHAGOGASTRODUODENOSCOPY (EGD) WITH PROPOFOL;  Surgeon: Gatha Mayer, MD;  Location: [place];  Service: Endoscopy;  Laterality: N/A;   ESOPHAGOGASTRODUODENOSCOPY (EGD) WITH PROPOFOL N/A 01/09/2021   Procedure: ESOPHAGOGASTRODUODENOSCOPY (EGD) WITH PROPOFOL;  Surgeon: Irene Shipper, MD;  Location: [facility] ENDOSCOPY;  Service: Endoscopy;  Laterality: N/A;   HEMOSTASIS CLIP PLACEMENT  12/22/2019   Procedure: HEMOSTASIS CLIP PLACEMENT;  Surgeon:  Cirigliano, Dominic Pea, DO;  Location: [place] ENDOSCOPY;  Service: Gastroenterology;;   HEMOSTASIS CLIP PLACEMENT  12/22/2020   Procedure: HEMOSTASIS CLIP PLACEMENT;  Surgeon: Gatha Mayer, MD;  Location: [facility] ENDOSCOPY;  Service: Endoscopy;;   HEMOSTASIS CONTROL  12/22/2020   Procedure: HEMOSTASIS CONTROL/hemospray;  Surgeon: Gatha Mayer, MD;  Location: [place];  Service: Endoscopy;;   HOT HEMOSTASIS N/A 02/08/2019   Procedure: HOT HEMOSTASIS (ARGON PLASMA COAGULATION/BICAP);  Surgeon: Milus Banister, MD;  Location: [facility] ENDOSCOPY;  Service: Gastroenterology;  Laterality: N/A;   HOT HEMOSTASIS N/A 12/22/2019   Procedure: HOT HEMOSTASIS (ARGON PLASMA COAGULATION/BICAP);  Surgeon: Lavena Bullion, DO;  Location: [facility] ENDOSCOPY;  Service: Gastroenterology;   Laterality: N/A;   HOT HEMOSTASIS N/A 10/19/2020   Procedure: HOT HEMOSTASIS (ARGON PLASMA COAGULATION/BICAP);  Surgeon: Jackquline Denmark, MD;  Location: [facility] ENDOSCOPY;  Service: Endoscopy;  Laterality: N/A;   HOT HEMOSTASIS N/A 12/22/2020   Procedure: HOT HEMOSTASIS (ARGON PLASMA COAGULATION/BICAP);  Surgeon: Gatha Mayer, MD;  Location: [facility] ENDOSCOPY;  Service: Endoscopy;  Laterality: N/A;   HOT HEMOSTASIS N/A 01/09/2021   Procedure: HOT HEMOSTASIS (ARGON PLASMA COAGULATION/BICAP);  Surgeon: Irene Shipper, MD;  Location: [facility] ENDOSCOPY;  Service: Endoscopy;  Laterality: N/A;   HOT HEMOSTASIS N/A 01/25/2021   Procedure: HOT HEMOSTASIS (ARGON PLASMA COAGULATION/BICAP);  Surgeon: Doran Stabler, MD;  Location: [place];  Service: Gastroenterology;  Laterality: N/A;   HOT HEMOSTASIS N/A 02/13/2021   Procedure: HOT HEMOSTASIS (ARGON PLASMA COAGULATION/BICAP);  Surgeon: Jackquline Denmark, MD;  Location: [facility] ENDOSCOPY;  Service: Endoscopy;  Laterality: N/A;   HOT HEMOSTASIS N/A 05/07/2021   Procedure: HOT HEMOSTASIS (ARGON PLASMA COAGULATION/BICAP);  Surgeon: Yetta Flock, MD;  Location: [facility] ENDOSCOPY;  Service: Gastroenterology;  Laterality: N/A;   INTERTROCHANTERIC HIP FRACTURE SURGERY Left 11/2006   [name] 03/08/2011   INTRAVASCULAR ULTRASOUND/IVUS N/A 12/19/2019   Procedure: Intravascular Ultrasound/IVUS;  Surgeon: Jettie Booze, MD;  Location: [place] CV LAB;  Service: Cardiovascular;  Laterality: N/A;   IR FLUORO GUIDE CV LINE RIGHT  07/24/2019   IR FLUORO GUIDE CV LINE RIGHT  07/30/2019   IR US GUIDE VASC ACCESS RIGHT  07/24/2019   IR US GUIDE VASC ACCESS RIGHT  07/30/2019   LAPAROSCOPIC CHOLECYSTECTOMY  05/2006   LIGATION OF COMPETING BRANCHES OF ARTERIOVENOUS FISTULA Left 11/05/2018   Procedure: LIGATION OF COMPETING BRANCHES OF ARTERIOVENOUS FISTULA  LEFT  ARM;  Surgeon: Marty Heck, MD;  Location: [facility] OR;  Service: Vascular;  Laterality: Left;   LUMBAR  LAMINECTOMY/DECOMPRESSION MICRODISCECTOMY N/A 02/29/2016   Procedure: Left L4-5 Lateral Recess Decompression, Removal Extradural Intraspinal Facet Cyst;  Surgeon: Marybelle Killings, MD;  Location: [place];  Service: Orthopedics;  Laterality: N/A;   MULTIPLE TOOTH EXTRACTIONS     ORIF MANDIBULAR FRACTURE Left 08/13/2004   ORIF of left body fracture mandible with KLS Martin 2.3-mm six hole/notes 03/08/2011   RIGHT/LEFT HEART CATH AND CORONARY/GRAFT ANGIOGRAPHY N/A 12/19/2019   Procedure: RIGHT/LEFT HEART CATH AND CORONARY/GRAFT ANGIOGRAPHY;  Surgeon: Jettie Booze, MD;  Location: [place] CV LAB;  Service: Cardiovascular;  Laterality: N/A;   SCLEROTHERAPY  12/22/2019   Procedure: SCLEROTHERAPY;  Surgeon: Lavena Bullion, DO;  Location: [place];  Service: Gastroenterology;;   Clide Deutscher  02/13/2021   Procedure: Clide Deutscher;  Surgeon: Jackquline Denmark, MD;  Location: [facility] ENDOSCOPY;  Service: Endoscopy;;    Current Medications: Current Meds  Medication Sig   acetaminophen (TYLENOL) 650 MG CR tablet Take 1,300 mg by mouth 3 (three)  times daily.   allopurinol (ZYLOPRIM) 100 MG tablet TAKE 1 TABLET(100 MG) BY MOUTH DAILY   ANORO ELLIPTA 62.5-25 MCG/INH AEPB INHALE 1 PUFF BY MOUTH EVERY DAY   B-D UF III MINI PEN NEEDLES 31G X 5 MM MISC USE FOUR TIMES DAILY   BIKTARVY 50-200-25 MG TABS tablet Take 1 tablet by mouth daily.   bismuth-metronidazole-tetracycline (PYLERA) 140-125-125 MG capsule Take 3 capsules by mouth 4 (four) times daily -  before meals and at bedtime.   Calcium Acetate 667 MG TABS Take 667-1,334 mg by mouth See admin instructions. Take 1,334 mg by mouth three times a day with meals and 667 mg with each snack   carvedilol (COREG) 3.125 MG tablet TAKE 1 TABLET(3.125 MG) BY MOUTH TWICE DAILY   colchicine 0.6 MG tablet TAKE 1 TABLET BY MOUTH AS NEEDED FOR GOUT   diclofenac Sodium (VOLTAREN) 1 % GEL APPLY 2 GRAMS TOPICALLY TO THE AFFECTED AREA THREE TIMES DAILY AS NEEDED FOR PAIN    folic acid (FOLVITE) 1 MG tablet TAKE 1 TABLET(1 MG) BY MOUTH DAILY   gabapentin (NEURONTIN) 300 MG capsule TAKE 1 CAPSULE BY MOUTH DAILY. DOSE CAHNGE DUE TO RENAL FUNCTION   glucose blood (ONETOUCH ULTRA) test strip Check blood sugar three times daily. Dx:E11.40   insulin lispro (HUMALOG) 100 UNIT/ML KwikPen INJECT 13 UNITS UNDER THE SKIN THREE TIMES DAILY AS NEEDED FOR HIGH BLOOD SUGAR(ABOVE 150)   isosorbide mononitrate (IMDUR) 30 MG 24 hr tablet TAKE 1 TABLET BY MOUTH EVERY DAY   Lancets (ONETOUCH DELICA PLUS [id number]) MISC 1 Device by Other route 3 (three) times daily. E11.42   latanoprost (XALATAN) 0.005 % ophthalmic solution Place 1 drop into both eyes at bedtime.   lidocaine-prilocaine (EMLA) cream Apply 1 application topically every Monday, Wednesday, and Friday with hemodialysis.   Methoxy PEG-Epoetin Beta (MIRCERA IJ) Mircera   multivitamin (RENA-VIT) TABS tablet Take 1 tablet by mouth daily.   nitroGLYCERIN (NITROSTAT) 0.3 MG SL tablet PLACE 1 TABLET UNDER THE TONGUE AS NEEDED FOR CHEST PAIN   octreotide (SANDOSTATIN LAR) 10 MG injection Inject 10 mg into the muscle every 28 (twenty-eight) days.   ondansetron (ZOFRAN) 4 MG tablet Take 1 tablet (4 mg total) by mouth every 8 (eight) hours as needed for nausea or vomiting.   oxyCODONE-acetaminophen (PERCOCET/ROXICET) 5-325 MG tablet Take 1 tablet by mouth every 4 (four) hours as needed for severe pain.   pantoprazole (PROTONIX) 40 MG tablet Take 1 tablet (40 mg total) by mouth 2 (two) times daily before a meal.   polyethylene glycol (MIRALAX / GLYCOLAX) 17 g packet Take 17 g by mouth daily as needed for mild constipation.   timolol (TIMOPTIC) 0.5 % ophthalmic solution Place 1 drop into both eyes in the morning.   VITAMIN D PO Take by mouth.  Pt states he was told to hold Protonix until upcoming "breathalyzer test" to recheck bacteria.   Allergies:   Augmentin [amoxicillin-pot clavulanate], Mucinex fast-max, and Amphetamines   Social  History   Socioeconomic History   Marital status: Single    Spouse name: Not on file   Number of children: 1   Years of education: Not on file   Highest education level: Not on file  Occupational History   Occupation: retired  Tobacco Use   Smoking status: Every Day    Packs/day: 0.50    Years: 43.00    Pack years: 21.50    Types: Cigarettes   Smokeless tobacco: Never  Vaping Use   Vaping  Use: Never used  Substance and Sexual Activity   Alcohol use: Not Currently    Alcohol/week: 12.0 standard drinks    Types: 12 Standard drinks or equivalent per week    Comment: occassional   Drug use: Not Currently    Types: Cocaine    Comment: hx of crack/cocaine 36yrs ago 10/01/2019- none   Sexual activity: Yes    Partners: Male    Comment: Declined condoms  Other Topics Concern   Not on file  Social History Narrative   Retired.       As of 05/2015:   Diet: No salt    Caffeine   Married: Single    House: Condo, 2 stories, 1 person (self)    Pets: No    Current/Past profession: N/A   Exercise: walks daily    Living Will: Yes    DNR   POA/HPOA: No       Social Determinants of Radio broadcast assistant Strain: Low Risk    Difficulty of Paying Living Expenses: Not hard at all  Food Insecurity: No Food Insecurity   Worried About Charity fundraiser in the Last Year: Never true   Arboriculturist in the Last Year: Never true  Transportation Needs: No Transportation Needs   Lack of Transportation (Medical): No   Lack of Transportation (Non-Medical): No  Physical Activity: Not on file  Stress: Not on file  Social Connections: Not on file     Family History:  The patient's family history includes Alzheimer's disease in his mother and sister; Diabetes in his brother, brother, and sister; Drug abuse in his brother; Heart attack in his brother; Heart failure in his father; Hypertension in his brother and father; Stroke in his sister. There is no history of Colon cancer.  ROS:    Please see the history of present illness. All other systems are reviewed and otherwise negative.    EKGs/Labs/Other Studies Reviewed:    Studies reviewed are outlined and summarized above. Reports included below if pertinent.  2D echo 09/2020   1. Left ventricular ejection fraction, by estimation, is 40 to 45%. The  left ventricle has mildly decreased function. The left ventricle  demonstrates regional wall motion abnormalities (see scoring  diagram/findings for description). The left ventricular   internal cavity size was moderately dilated. There is moderate concentric  left ventricular hypertrophy. Left ventricular diastolic parameters are  consistent with Grade II diastolic dysfunction (pseudonormalization).  Elevated left atrial pressure.   2. Right ventricular systolic function is normal. The right ventricular  size is normal. There is normal pulmonary artery systolic pressure.   3. Left atrial size was moderately dilated.   4. The mitral valve is normal in structure. Mild mitral valve  regurgitation. No evidence of mitral stenosis.   5. The aortic valve is normal in structure. Aortic valve regurgitation is  not visualized. No aortic stenosis is present.   6. The inferior vena cava is dilated in size with >50% respiratory  variability, suggesting right atrial pressure of 8 mmHg.   R/LHC 11/2019 Prox RCA lesion is 100% stenosed. SVG to RCA is widely patent. Dist LAD lesion is 90% stenosed. Too small and distal for PCI. 3rd Diag lesion is 50% stenosed. Prox Cx lesion is 100% stenosed. Left to left collaterals. Ramus lesion is 100% stenosed. SVG to ramus with Origin to Prox Graft lesion is 95% stenosed. A drug-eluting stent was successfully placed using a SYNERGY XD [id number], postdilated to >4 mm.  Optimized with IVUS. Post intervention, there is a 0% residual stenosis. There is moderate to severe left ventricular systolic dysfunction. There is no aortic valve  stenosis. Hemodynamic findings consistent with moderate pulmonary hypertension.   Continue dual antiplatelet therapy for 6 months at least.  COnsider clopidogrel longer term if there is no interaction with his HIV meds, given the diffuse nature of CAD.      EKG:  EKG is not ordered today  Recent Labs: 12/15/2020: TSH 0.49 05/07/2021: Magnesium 1.9 06/23/2021: ALT 18 07/08/2021: B Natriuretic Peptide 2,737.5; BUN 51; Creatinine, Ser 6.74; Hemoglobin 7.9; Platelets 214; Potassium 4.3; Sodium 131  Recent Lipid Panel    Component Value Date/Time   CHOL 111 05/13/2021 0844   TRIG 111 05/13/2021 0844   HDL 32 (L) 05/13/2021 0844   CHOLHDL 3.5 05/13/2021 0844   CHOLHDL 4.6 12/15/2020 1105   VLDL 19 12/21/2019 0352   LDLCALC 58 05/13/2021 0844   LDLCALC 79 12/15/2020 1105    PHYSICAL EXAM:    VS:  BP 128/68   Pulse 70   Ht 5\' 11"  (1.803 m)   Wt 169 lb (76.7 kg)   SpO2 95%   BMI 23.57 kg/m   BMI: Body mass index is 23.57 kg/m.  Affect appropriate Chronically ill black male  HEENT: normal Neck supple with no adenopathy JVP normal no bruits no thyromegaly Lungs clear with no wheezing and good diaphragmatic motion Heart:  S1/S2 SEM AV sclerosis murmur, no rub, gallop or click PMI normal Abdomen: benighn, BS positve, no tenderness, no AAA no bruit.  No HSM or HJR Distal pulses intact with no bruits No edema Left UE fistula with thrill    Wt Readings from Last 3 Encounters:  09/21/21 169 lb (76.7 kg)  09/07/21 178 lb (80.7 kg)  09/02/21 167 lb (75.8 kg)     ASSESSMENT & PLAN:   1. CAD :  no angina CABG 2004 cath 12/19/19 with patent SVG to RCA occluded circumflex with collaterals, stent to SVG of ramus. GI bleeding and Brillinta d/c now on ASA monoRx.   2. Chronic Systolic CHF:  EF 97-67% by TTE 10/18/20 On beta blocker nitrates No ACE/ARNi or aldactone due to renal dx  3. Recurrent GI bleeding - crux of issues lately, requring multiple readmissions. Keep follow-up as  scheduled with GI - suggest their routine surveillance of his hemoglobin levels.ASA monoRx  4. Essential HTN - controlled today. No changes made.  5. CRF:  f/u nephrology for dialysis and monitoring Hb on epo  6. HIV:  contiue Kiktarvy f/u ID counts ok   7. HLD:  continue statin LDL 58 05/13/21   Disposition: F/U in a year   Medication Adjustments/Labs and Tests Ordered: Current medicines are reviewed at length with the patient today.  Concerns regarding medicines are outlined above. Medication changes, Labs and Tests ordered today are summarized above and listed in the Patient Instructions accessible in Encounters.   Signed, Jenkins Rouge, MD  09/21/2021 8:05 AM    [facility] [address] [place], [place], [place]  [address] Phone: [phone]; Fax: [phone]

## 2021-09-10 DIAGNOSIS — D631 Anemia in chronic kidney disease: Secondary | ICD-10-CM | POA: Diagnosis not present

## 2021-09-10 DIAGNOSIS — Z992 Dependence on renal dialysis: Secondary | ICD-10-CM | POA: Diagnosis not present

## 2021-09-10 DIAGNOSIS — N2581 Secondary hyperparathyroidism of renal origin: Secondary | ICD-10-CM | POA: Diagnosis not present

## 2021-09-10 DIAGNOSIS — D509 Iron deficiency anemia, unspecified: Secondary | ICD-10-CM | POA: Diagnosis not present

## 2021-09-10 DIAGNOSIS — N186 End stage renal disease: Secondary | ICD-10-CM | POA: Diagnosis not present

## 2021-09-13 DIAGNOSIS — Z992 Dependence on renal dialysis: Secondary | ICD-10-CM | POA: Diagnosis not present

## 2021-09-13 DIAGNOSIS — N186 End stage renal disease: Secondary | ICD-10-CM | POA: Diagnosis not present

## 2021-09-13 DIAGNOSIS — N2581 Secondary hyperparathyroidism of renal origin: Secondary | ICD-10-CM | POA: Diagnosis not present

## 2021-09-13 DIAGNOSIS — D631 Anemia in chronic kidney disease: Secondary | ICD-10-CM | POA: Diagnosis not present

## 2021-09-13 DIAGNOSIS — D509 Iron deficiency anemia, unspecified: Secondary | ICD-10-CM | POA: Diagnosis not present

## 2021-09-14 NOTE — Telephone Encounter (Signed)
Patient called requesting refill on his Pain Medication.  I told patient that I would send it to Dr. Sabra Heck tomorrow for approval. He agreed.

## 2021-09-15 ENCOUNTER — Other Ambulatory Visit: Payer: Self-pay | Admitting: Family Medicine

## 2021-09-15 DIAGNOSIS — M48061 Spinal stenosis, lumbar region without neurogenic claudication: Secondary | ICD-10-CM

## 2021-09-15 DIAGNOSIS — N186 End stage renal disease: Secondary | ICD-10-CM | POA: Diagnosis not present

## 2021-09-15 DIAGNOSIS — D509 Iron deficiency anemia, unspecified: Secondary | ICD-10-CM | POA: Diagnosis not present

## 2021-09-15 DIAGNOSIS — D631 Anemia in chronic kidney disease: Secondary | ICD-10-CM | POA: Diagnosis not present

## 2021-09-15 DIAGNOSIS — Z992 Dependence on renal dialysis: Secondary | ICD-10-CM | POA: Diagnosis not present

## 2021-09-15 DIAGNOSIS — N2581 Secondary hyperparathyroidism of renal origin: Secondary | ICD-10-CM | POA: Diagnosis not present

## 2021-09-15 MED ORDER — OXYCODONE-ACETAMINOPHEN 5-325 MG PO TABS: 1.0000 | ORAL_TABLET | ORAL | 0 refills | Status: DC | PRN

## 2021-09-15 NOTE — Telephone Encounter (Signed)
Patient requested refill.  Epic LR: 08/17/2021 Contract Date: 07/20/2021 Pended Rx and sent to Dr. Sabra Heck for review.

## 2021-09-15 NOTE — Telephone Encounter (Signed)
Dr. Sabra Heck ok the prescription this morning but it was sent to the wrong pharmacy.   Pended Rx and sent to Amy for approval due to Dr. Sabra Heck out of office.

## 2021-09-15 NOTE — Telephone Encounter (Signed)
Prescription filled.

## 2021-09-15 NOTE — Addendum Note (Signed)
Addended by: Rafael Bihari A on: 09/15/2021 04:18 PM   Modules accepted: Orders

## 2021-09-15 NOTE — Addendum Note (Signed)
Addended byWindell Moulding E on: 09/15/2021 05:04 PM   Modules accepted: Orders

## 2021-09-15 NOTE — Addendum Note (Signed)
Addended by: Rafael Bihari A on: 09/15/2021 09:49 AM   Modules accepted: Orders

## 2021-09-18 DIAGNOSIS — D509 Iron deficiency anemia, unspecified: Secondary | ICD-10-CM | POA: Diagnosis not present

## 2021-09-18 DIAGNOSIS — N2581 Secondary hyperparathyroidism of renal origin: Secondary | ICD-10-CM | POA: Diagnosis not present

## 2021-09-18 DIAGNOSIS — D631 Anemia in chronic kidney disease: Secondary | ICD-10-CM | POA: Diagnosis not present

## 2021-09-18 DIAGNOSIS — Z992 Dependence on renal dialysis: Secondary | ICD-10-CM | POA: Diagnosis not present

## 2021-09-18 DIAGNOSIS — N186 End stage renal disease: Secondary | ICD-10-CM | POA: Diagnosis not present

## 2021-09-20 ENCOUNTER — Telehealth: Payer: Self-pay

## 2021-09-20 DIAGNOSIS — D631 Anemia in chronic kidney disease: Secondary | ICD-10-CM | POA: Diagnosis not present

## 2021-09-20 DIAGNOSIS — Z992 Dependence on renal dialysis: Secondary | ICD-10-CM | POA: Diagnosis not present

## 2021-09-20 DIAGNOSIS — D509 Iron deficiency anemia, unspecified: Secondary | ICD-10-CM | POA: Diagnosis not present

## 2021-09-20 DIAGNOSIS — N2581 Secondary hyperparathyroidism of renal origin: Secondary | ICD-10-CM | POA: Diagnosis not present

## 2021-09-20 DIAGNOSIS — N186 End stage renal disease: Secondary | ICD-10-CM | POA: Diagnosis not present

## 2021-09-20 NOTE — Telephone Encounter (Signed)
Incoming fax received from [facility] requesting refill on allopurinol. Allopurinol was filled at a local pharmacy in October 2022 with additional refill and I need to clarify which pharmacy patient will receive mediation from in the future.   Awaiting return call

## 2021-09-20 NOTE — Telephone Encounter (Signed)
Spoke with patient, patient states he get allopurinol from [facility] and is not sure why [facility] is sending to [place]. Patient aware I will call [facility] to notify them.  Spoke with with pharmacist at [facility], request will be removed from patients account

## 2021-09-21 ENCOUNTER — Encounter: Payer: Self-pay | Admitting: Cardiovascular Disease

## 2021-09-21 ENCOUNTER — Ambulatory Visit (INDEPENDENT_AMBULATORY_CARE_PROVIDER_SITE_OTHER): Payer: Medicare Other | Admitting: Cardiovascular Disease

## 2021-09-21 ENCOUNTER — Other Ambulatory Visit: Payer: Self-pay

## 2021-09-21 VITALS — BP 128/68 | HR 70 | Ht 71.0 in | Wt 169.0 lb

## 2021-09-21 DIAGNOSIS — I5022 Chronic systolic (congestive) heart failure: Secondary | ICD-10-CM

## 2021-09-21 DIAGNOSIS — E1122 Type 2 diabetes mellitus with diabetic chronic kidney disease: Secondary | ICD-10-CM | POA: Diagnosis not present

## 2021-09-21 DIAGNOSIS — N184 Chronic kidney disease, stage 4 (severe): Secondary | ICD-10-CM | POA: Diagnosis not present

## 2021-09-21 DIAGNOSIS — I2510 Atherosclerotic heart disease of native coronary artery without angina pectoris: Secondary | ICD-10-CM | POA: Diagnosis not present

## 2021-09-21 NOTE — Patient Instructions (Signed)
Medication Instructions:  Your physician recommends that you continue on your current medications as directed. Please refer to the Current Medication list given to you today.  *If you need a refill on your cardiac medications before your next appointment, please call your pharmacy*   Lab Work: None  If you have labs (blood work) drawn today and your tests are completely normal, you will receive your results only by: [place] (if you have MyChart) OR A paper copy in the mail If you have any lab test that is abnormal or we need to change your treatment, we will call you to review the results.   Testing/Procedures: None  Follow-Up: At [facility], you and your health needs are our priority.  As part of our continuing mission to provide you with exceptional heart care, we have created designated Provider Care Teams.  These Care Teams include your primary Cardiologist (physician) and Advanced Practice Providers (APPs -  Physician Assistants and Nurse Practitioners) who all work together to provide you with the care you need, when you need it.  We recommend signing up for the patient portal called "MyChart".  Sign up information is provided on this After Visit Summary.  MyChart is used to connect with patients for Virtual Visits (Telemedicine).  Patients are able to view lab/test results, encounter notes, upcoming appointments, etc.  Non-urgent messages can be sent to your provider as well.   To learn more about what you can do with MyChart, go to [email or web address].    Your next appointment:   12 month(s)  The format for your next appointment:   In Person  Provider:   Jenkins Rouge, MD  or [address], PA-C, Nicholes Rough, PA-C, Melina Copa, PA-C, Ermalinda Barrios, PA-C, Christen Bame, NP, or Richardson Dopp, [place]    }    Other Instructions None

## 2021-09-22 DIAGNOSIS — N2581 Secondary hyperparathyroidism of renal origin: Secondary | ICD-10-CM | POA: Diagnosis not present

## 2021-09-22 DIAGNOSIS — N186 End stage renal disease: Secondary | ICD-10-CM | POA: Diagnosis not present

## 2021-09-22 DIAGNOSIS — Z992 Dependence on renal dialysis: Secondary | ICD-10-CM | POA: Diagnosis not present

## 2021-09-22 DIAGNOSIS — D631 Anemia in chronic kidney disease: Secondary | ICD-10-CM | POA: Diagnosis not present

## 2021-09-22 DIAGNOSIS — D509 Iron deficiency anemia, unspecified: Secondary | ICD-10-CM | POA: Diagnosis not present

## 2021-09-22 DIAGNOSIS — I129 Hypertensive chronic kidney disease with stage 1 through stage 4 chronic kidney disease, or unspecified chronic kidney disease: Secondary | ICD-10-CM | POA: Diagnosis not present

## 2021-09-24 DIAGNOSIS — D509 Iron deficiency anemia, unspecified: Secondary | ICD-10-CM | POA: Diagnosis not present

## 2021-09-24 DIAGNOSIS — Z992 Dependence on renal dialysis: Secondary | ICD-10-CM | POA: Diagnosis not present

## 2021-09-24 DIAGNOSIS — D631 Anemia in chronic kidney disease: Secondary | ICD-10-CM | POA: Diagnosis not present

## 2021-09-24 DIAGNOSIS — N2581 Secondary hyperparathyroidism of renal origin: Secondary | ICD-10-CM | POA: Diagnosis not present

## 2021-09-24 DIAGNOSIS — N186 End stage renal disease: Secondary | ICD-10-CM | POA: Diagnosis not present

## 2021-09-27 DIAGNOSIS — D509 Iron deficiency anemia, unspecified: Secondary | ICD-10-CM | POA: Diagnosis not present

## 2021-09-27 DIAGNOSIS — N186 End stage renal disease: Secondary | ICD-10-CM | POA: Diagnosis not present

## 2021-09-27 DIAGNOSIS — D631 Anemia in chronic kidney disease: Secondary | ICD-10-CM | POA: Diagnosis not present

## 2021-09-27 DIAGNOSIS — Z992 Dependence on renal dialysis: Secondary | ICD-10-CM | POA: Diagnosis not present

## 2021-09-27 DIAGNOSIS — N2581 Secondary hyperparathyroidism of renal origin: Secondary | ICD-10-CM | POA: Diagnosis not present

## 2021-09-29 DIAGNOSIS — N2581 Secondary hyperparathyroidism of renal origin: Secondary | ICD-10-CM | POA: Diagnosis not present

## 2021-09-29 DIAGNOSIS — Z992 Dependence on renal dialysis: Secondary | ICD-10-CM | POA: Diagnosis not present

## 2021-09-29 DIAGNOSIS — D631 Anemia in chronic kidney disease: Secondary | ICD-10-CM | POA: Diagnosis not present

## 2021-09-29 DIAGNOSIS — D509 Iron deficiency anemia, unspecified: Secondary | ICD-10-CM | POA: Diagnosis not present

## 2021-09-29 DIAGNOSIS — N186 End stage renal disease: Secondary | ICD-10-CM | POA: Diagnosis not present

## 2021-09-30 ENCOUNTER — Other Ambulatory Visit (HOSPITAL_COMMUNITY): Payer: Self-pay

## 2021-09-30 ENCOUNTER — Ambulatory Visit (HOSPITAL_COMMUNITY)
Admission: RE | Admit: 2021-09-30 | Discharge: 2021-09-30 | Disposition: A | Discharge status: Home / Self Care | Payer: Medicare Other | Source: Ambulatory Visit | Attending: Cardiology | Admitting: Cardiology

## 2021-09-30 ENCOUNTER — Telehealth: Payer: Self-pay

## 2021-09-30 DIAGNOSIS — Q2730 Arteriovenous malformation, site unspecified: Secondary | ICD-10-CM

## 2021-09-30 MED ORDER — OCTREOTIDE ACETATE 10 MG IM KIT: 10.0000 mg | PACK | INTRAMUSCULAR | Status: DC

## 2021-09-30 NOTE — Telephone Encounter (Signed)
Received a call from [place] [phone]) at the [facility] infusion center. She states the pharmacy did not order the correct dosage of Sandostatin LAR for the patient. The pt is upset and wants to skip this dose of Sandostatin LAR. They wanted to know if he could be given a one time dose on Sandostatin LAR 20 MG this time or if he can skip. Advised that I would check with Dr. Havery Moros and let them know.   I spoke with Dr. Havery Moros and he states that the dosing does not need to be changed because the pt has a renal based dosing. He states that if they can't halve the dose then it would be ok to push the appt out for a couple of weeks to allow time for the pharmacy to order the correct dose. I received a vm from [place] [phone]), pharmacist at [facility]. I called her back and reviewed Dr. Doyne Keel recommendations. Crystal states that they will get the correct dose ordered and get patient rescheduled to come back within the next 2 weeks. Crystal verbalized understanding and had no concerns at the end of the call.

## 2021-10-01 ENCOUNTER — Other Ambulatory Visit (HOSPITAL_COMMUNITY): Payer: Self-pay

## 2021-10-01 DIAGNOSIS — N2581 Secondary hyperparathyroidism of renal origin: Secondary | ICD-10-CM | POA: Diagnosis not present

## 2021-10-01 DIAGNOSIS — N186 End stage renal disease: Secondary | ICD-10-CM | POA: Diagnosis not present

## 2021-10-01 DIAGNOSIS — D631 Anemia in chronic kidney disease: Secondary | ICD-10-CM | POA: Diagnosis not present

## 2021-10-01 DIAGNOSIS — D509 Iron deficiency anemia, unspecified: Secondary | ICD-10-CM | POA: Diagnosis not present

## 2021-10-01 DIAGNOSIS — Z992 Dependence on renal dialysis: Secondary | ICD-10-CM | POA: Diagnosis not present

## 2021-10-04 ENCOUNTER — Other Ambulatory Visit (HOSPITAL_COMMUNITY): Payer: Self-pay

## 2021-10-04 DIAGNOSIS — N186 End stage renal disease: Secondary | ICD-10-CM | POA: Diagnosis not present

## 2021-10-04 DIAGNOSIS — D631 Anemia in chronic kidney disease: Secondary | ICD-10-CM | POA: Diagnosis not present

## 2021-10-04 DIAGNOSIS — Z992 Dependence on renal dialysis: Secondary | ICD-10-CM | POA: Diagnosis not present

## 2021-10-04 DIAGNOSIS — N2581 Secondary hyperparathyroidism of renal origin: Secondary | ICD-10-CM | POA: Diagnosis not present

## 2021-10-04 DIAGNOSIS — D509 Iron deficiency anemia, unspecified: Secondary | ICD-10-CM | POA: Diagnosis not present

## 2021-10-05 ENCOUNTER — Ambulatory Visit: Payer: Medicare Other | Admitting: Podiatry

## 2021-10-05 ENCOUNTER — Encounter: Payer: Self-pay | Admitting: *Deleted

## 2021-10-05 DIAGNOSIS — H401132 Primary open-angle glaucoma, bilateral, moderate stage: Secondary | ICD-10-CM | POA: Diagnosis not present

## 2021-10-05 DIAGNOSIS — E113293 Type 2 diabetes mellitus with mild nonproliferative diabetic retinopathy without macular edema, bilateral: Secondary | ICD-10-CM | POA: Diagnosis not present

## 2021-10-05 DIAGNOSIS — H5213 Myopia, bilateral: Secondary | ICD-10-CM | POA: Diagnosis not present

## 2021-10-05 DIAGNOSIS — H2513 Age-related nuclear cataract, bilateral: Secondary | ICD-10-CM | POA: Diagnosis not present

## 2021-10-05 LAB — HM DIABETES EYE EXAM

## 2021-10-06 DIAGNOSIS — N2581 Secondary hyperparathyroidism of renal origin: Secondary | ICD-10-CM | POA: Diagnosis not present

## 2021-10-06 DIAGNOSIS — D631 Anemia in chronic kidney disease: Secondary | ICD-10-CM | POA: Diagnosis not present

## 2021-10-06 DIAGNOSIS — N186 End stage renal disease: Secondary | ICD-10-CM | POA: Diagnosis not present

## 2021-10-06 DIAGNOSIS — Z992 Dependence on renal dialysis: Secondary | ICD-10-CM | POA: Diagnosis not present

## 2021-10-06 DIAGNOSIS — D509 Iron deficiency anemia, unspecified: Secondary | ICD-10-CM | POA: Diagnosis not present

## 2021-10-07 ENCOUNTER — Ambulatory Visit (HOSPITAL_COMMUNITY)
Admission: RE | Admit: 2021-10-07 | Discharge: 2021-10-07 | Disposition: A | Discharge status: Home / Self Care | Payer: Medicare Other | Source: Ambulatory Visit | Attending: Gastroenterology | Admitting: Gastroenterology

## 2021-10-07 ENCOUNTER — Other Ambulatory Visit: Payer: Medicare Other

## 2021-10-07 ENCOUNTER — Other Ambulatory Visit: Payer: Self-pay

## 2021-10-07 DIAGNOSIS — Q2730 Arteriovenous malformation, site unspecified: Secondary | ICD-10-CM

## 2021-10-07 DIAGNOSIS — A048 Other specified bacterial intestinal infections: Secondary | ICD-10-CM

## 2021-10-07 LAB — BASIC METABOLIC PANEL
BUN: 88 — AB (ref 4–21)
Chloride: 94 — AB (ref 99–108)
Creatinine: 9.3 — AB (ref 0.6–1.3)
Glucose: 190
Potassium: 5 (ref 3.4–5.3)
Sodium: 135 — AB (ref 137–147)

## 2021-10-07 LAB — COMPREHENSIVE METABOLIC PANEL
Albumin: 4.2 (ref 3.5–5.0)
Calcium: 9 (ref 8.7–10.7)
Calcium: 9.6 (ref 8.7–10.7)
Globulin: 3.9

## 2021-10-07 LAB — CBC AND DIFFERENTIAL
HCT: 45 (ref 41–53)
Hemoglobin: 14.4 (ref 13.5–17.5)
Platelets: 150 (ref 150–399)
WBC: 4

## 2021-10-07 LAB — IRON,TIBC AND FERRITIN PANEL
Ferritin: 203
Iron: 72
TIBC: 308
UIBC: 236

## 2021-10-07 LAB — CBC: RBC: 4.48 (ref 3.87–5.11)

## 2021-10-07 MED ORDER — OCTREOTIDE ACETATE 10 MG IM KIT: 10.0000 mg | PACK | INTRAMUSCULAR | Status: DC

## 2021-10-07 MED ORDER — OCTREOTIDE ACETATE 10 MG IM KIT
10.0000 mg | PACK | INTRAMUSCULAR | Status: DC
  Administered 2021-10-07: 10 mg via INTRAMUSCULAR
  Filled 2021-10-07: qty 1

## 2021-10-08 ENCOUNTER — Other Ambulatory Visit (HOSPITAL_COMMUNITY): Payer: Self-pay

## 2021-10-08 DIAGNOSIS — N2581 Secondary hyperparathyroidism of renal origin: Secondary | ICD-10-CM | POA: Diagnosis not present

## 2021-10-08 DIAGNOSIS — N186 End stage renal disease: Secondary | ICD-10-CM | POA: Diagnosis not present

## 2021-10-08 DIAGNOSIS — D509 Iron deficiency anemia, unspecified: Secondary | ICD-10-CM | POA: Diagnosis not present

## 2021-10-08 DIAGNOSIS — D631 Anemia in chronic kidney disease: Secondary | ICD-10-CM | POA: Diagnosis not present

## 2021-10-08 DIAGNOSIS — Z992 Dependence on renal dialysis: Secondary | ICD-10-CM | POA: Diagnosis not present

## 2021-10-11 DIAGNOSIS — N2581 Secondary hyperparathyroidism of renal origin: Secondary | ICD-10-CM | POA: Diagnosis not present

## 2021-10-11 DIAGNOSIS — N186 End stage renal disease: Secondary | ICD-10-CM | POA: Diagnosis not present

## 2021-10-11 DIAGNOSIS — D509 Iron deficiency anemia, unspecified: Secondary | ICD-10-CM | POA: Diagnosis not present

## 2021-10-11 DIAGNOSIS — D631 Anemia in chronic kidney disease: Secondary | ICD-10-CM | POA: Diagnosis not present

## 2021-10-11 DIAGNOSIS — Z992 Dependence on renal dialysis: Secondary | ICD-10-CM | POA: Diagnosis not present

## 2021-10-12 ENCOUNTER — Ambulatory Visit (INDEPENDENT_AMBULATORY_CARE_PROVIDER_SITE_OTHER): Payer: Medicare Other | Admitting: Podiatry

## 2021-10-12 ENCOUNTER — Telehealth: Payer: Self-pay

## 2021-10-12 ENCOUNTER — Other Ambulatory Visit: Payer: Self-pay

## 2021-10-12 ENCOUNTER — Encounter: Payer: Self-pay | Admitting: Podiatry

## 2021-10-12 DIAGNOSIS — E1142 Type 2 diabetes mellitus with diabetic polyneuropathy: Secondary | ICD-10-CM

## 2021-10-12 DIAGNOSIS — L84 Corns and callosities: Secondary | ICD-10-CM

## 2021-10-12 LAB — H. PYLORI BREATH TEST: H. pylori Breath Test: DETECTED — AB

## 2021-10-12 MED FILL — Octreotide Acetate For IM Inj Kit 10 MG: INTRAMUSCULAR | Qty: 1 | Status: AC

## 2021-10-12 NOTE — Progress Notes (Signed)
Subjective:  [name], male    DOB: 1951/05/22,   MRN: [id number]  Chief Complaint  Patient presents with   lesion    6 weeks with Dr.Raeford Brandenburg rfc right foot    70 y.o. male presents for follow-up of  pre-ulcerative callus. Patient has been in the care of Dr. Elisha Ponder States he has a callus on the right foot that is not doing too well. Relates constant pain when he is walking on this foot and in certain shoes. Last A1c was 5.8  . Denies any other pedal complaints. Denies n/v/f/c.    PCP: Alain Honey MD  Past Medical History:  Diagnosis Date   Acute respiratory failure ([place]) 03/01/2018   Anemia    Arthritis    "all over; mostly knees and back" (02/28/2018)   Chronic combined systolic and diastolic CHF (congestive heart failure) ([facility])    Chronic lower back pain    stenosis   Community acquired pneumonia 09/06/2013   COPD (chronic obstructive pulmonary disease) ([place])    Coronary atherosclerosis of native coronary artery    a. 02/2003 s/p CABG x 2 (VG->RI, VG->RPDA; b. 11/2019 PCI: LM nl, LAD 90d, D3 50, RI 100, LCX 100p, OM3 100 - fills via L->L collats from D2/dLAD, RCA 100p, VG->RPDA ok, VG->RI 95 (3.5x48 Synergy XD DES).   Drug abuse ([place])    hx; tested for cocaine as recently as 2/08. says he is not using drugs now - avoided defib. for this reason    ESRD (end stage renal disease) ([place])    [facility] M-W-F- [facility]   Fall at home 10/2020   GERD (gastroesophageal reflux disease)    takes OTC meds as needed   GI bleeding    a. 11/2019 EGD: angiodysplastic lesions w/ bleeding s/p argon plasma/clipping/epi inj. Multiple admissions for the same.   Glaucoma    uses eye drops daily   Hepatitis B 1968   "tx'd w/isolation; caught it from toilet stools in gym"   History of blood transfusion 03/01/2019   History of colon polyps    benign   History of gout    takes Allopurinol daily as well as Colchicine-if needed (02/28/2018)   History of kidney stones    HTN (hypertension)     takes Coreg,Imdur.and Apresoline daily   Human immunodeficiency virus (HIV) disease ([place]) dx'd 1995   on [place] as of 12/2020.     Hyperlipidemia    Ischemic cardiomyopathy    a. 01/2019 Echo: EF 40-45%, diffuse HK, mild basal septal hypertrophy. Diast dysfxn. Nl RV size/fxn. Sev dil LA. Triv MR/TR/PR.   Muscle spasm    takes Zanaflex as needed   Myocardial infarction ([place]) ~ 2004/2005   Nocturia    Peripheral neuropathy    takes gabapentin daily   Pneumonia    "at least twice" (02/28/2018)   SDH (subdural hematoma)    Syphilis, unspecified    Type II diabetes mellitus ([place]) 2004   Lantus daily.Average fasting blood sugar 125-199   Wears glasses    Wears partial dentures     Objective:  Physical Exam: Vascular: DP/PT pulses 2/4 bilateral. CFT <3 seconds. Normal hair growth on digits. No edema.  Skin. No lacerations or abrasions bilateral feet. Nails 1-5 are thickened discolored and elongated with subungual debris.  Hyperkeratotic tissue noted sub right foot. Pre-ulcerative  Musculoskeletal: MMT 5/5 bilateral lower extremities in DF, PF, Inversion and Eversion. Deceased ROM in DF of ankle joint.  Neurological: Sensation intact to light  touch.   Assessment:   1. Diabetic peripheral neuropathy associated with type 2 diabetes mellitus ([place])   2. Pre-ulcerative calluses      Plan:  Patient was evaluated and treated and all questions answered. -Discussed corns and calluses with patient and treatment options. -Discussed and educated patient on diabetic foot care, especially with  regards to the vascular, neurological and musculoskeletal systems.  -Stressed the importance of good glycemic control and the detriment of not  controlling glucose levels in relation to the foot. -Discussed supportive shoes at all times and checking feet regularly.  -Hyperkeratotic tissue was debrided with chisel without incident.  -Encouraged daily moisturizing -Discussed use of pumice  stone -Advised good supportive shoes and inserts -Patient to return to office in January with Dr. Elisha Ponder for repeat routine foot care.    Lorenda Peck, DPM

## 2021-10-12 NOTE — Telephone Encounter (Signed)
Attempted to connect with patient, but has a call on another line. I have scheduled patient an appointment, he will call back to confirm if this date and time works for him.  Eugenia Mcalpine

## 2021-10-12 NOTE — Telephone Encounter (Signed)
-----   Message from [name], MD sent at 10/12/2021 11:44 AM EST ----- [place] H. pylori breath test remains positive.  Please schedule him in a 30-minute slot at the end of one of my morning clinics in early January.  Thanks.

## 2021-10-12 NOTE — Telephone Encounter (Signed)
Patient informed of lab results and scheduled for follow up on 10/28/20.

## 2021-10-13 ENCOUNTER — Other Ambulatory Visit: Payer: Self-pay | Admitting: *Deleted

## 2021-10-13 DIAGNOSIS — M48061 Spinal stenosis, lumbar region without neurogenic claudication: Secondary | ICD-10-CM

## 2021-10-13 DIAGNOSIS — Z992 Dependence on renal dialysis: Secondary | ICD-10-CM | POA: Diagnosis not present

## 2021-10-13 DIAGNOSIS — N2581 Secondary hyperparathyroidism of renal origin: Secondary | ICD-10-CM | POA: Diagnosis not present

## 2021-10-13 DIAGNOSIS — D509 Iron deficiency anemia, unspecified: Secondary | ICD-10-CM | POA: Diagnosis not present

## 2021-10-13 DIAGNOSIS — N186 End stage renal disease: Secondary | ICD-10-CM | POA: Diagnosis not present

## 2021-10-13 DIAGNOSIS — D631 Anemia in chronic kidney disease: Secondary | ICD-10-CM | POA: Diagnosis not present

## 2021-10-13 NOTE — Telephone Encounter (Signed)
Please verify which pharmacy to  send script.

## 2021-10-13 NOTE — Telephone Encounter (Signed)
Patient called requesting refill on Oxycodone Epic LR: 09/15/2021 Contract Date: 07/20/2021 Pended Rx and sent to [place] for approval due to Dr. Sabra Heck out of office.)

## 2021-10-14 ENCOUNTER — Encounter (HOSPITAL_COMMUNITY): Payer: Medicare Other

## 2021-10-14 MED ORDER — OXYCODONE-ACETAMINOPHEN 5-325 MG PO TABS: 1.0000 | ORAL_TABLET | ORAL | 0 refills | Status: DC | PRN

## 2021-10-14 NOTE — Telephone Encounter (Signed)
Sorry. It's Copywriter, advertising.

## 2021-10-18 DIAGNOSIS — D631 Anemia in chronic kidney disease: Secondary | ICD-10-CM | POA: Diagnosis not present

## 2021-10-18 DIAGNOSIS — D509 Iron deficiency anemia, unspecified: Secondary | ICD-10-CM | POA: Diagnosis not present

## 2021-10-18 DIAGNOSIS — Z992 Dependence on renal dialysis: Secondary | ICD-10-CM | POA: Diagnosis not present

## 2021-10-18 DIAGNOSIS — N2581 Secondary hyperparathyroidism of renal origin: Secondary | ICD-10-CM | POA: Diagnosis not present

## 2021-10-18 DIAGNOSIS — N186 End stage renal disease: Secondary | ICD-10-CM | POA: Diagnosis not present

## 2021-10-19 ENCOUNTER — Encounter: Payer: Self-pay | Admitting: Family Medicine

## 2021-10-19 ENCOUNTER — Other Ambulatory Visit: Payer: Self-pay

## 2021-10-19 ENCOUNTER — Ambulatory Visit (INDEPENDENT_AMBULATORY_CARE_PROVIDER_SITE_OTHER): Payer: Medicare Other | Admitting: Family Medicine

## 2021-10-19 VITALS — BP 148/56 | HR 72 | Temp 96.6°F | Ht 71.0 in | Wt 175.0 lb

## 2021-10-19 DIAGNOSIS — M545 Low back pain, unspecified: Secondary | ICD-10-CM | POA: Diagnosis not present

## 2021-10-19 DIAGNOSIS — A048 Other specified bacterial intestinal infections: Secondary | ICD-10-CM

## 2021-10-19 DIAGNOSIS — I509 Heart failure, unspecified: Secondary | ICD-10-CM

## 2021-10-19 DIAGNOSIS — D649 Anemia, unspecified: Secondary | ICD-10-CM

## 2021-10-19 DIAGNOSIS — K5521 Angiodysplasia of colon with hemorrhage: Secondary | ICD-10-CM | POA: Diagnosis not present

## 2021-10-19 DIAGNOSIS — B20 Human immunodeficiency virus [HIV] disease: Secondary | ICD-10-CM

## 2021-10-19 DIAGNOSIS — G8929 Other chronic pain: Secondary | ICD-10-CM | POA: Insufficient documentation

## 2021-10-19 NOTE — Progress Notes (Signed)
Provider:  Alain Honey, MD  Careteam: Patient Care Team: [name], MD as PCP - General (Family Medicine) [name], MD as PCP - Infectious Diseases (Infectious Diseases) [name], MD as PCP - Cardiology (Cardiology) [name], MD as Consulting Physician (Ophthalmology) [name], MD as Consulting Physician (Cardiology) [name], MD as Consulting Physician (Infectious Diseases) Gardiner Barefoot, DPM as Consulting Physician (Podiatry) [name], Nelda Bucks, NP as Nurse Practitioner (Family Medicine) Kidney, [place]:  [place]  Advanced Directive information    Allergies  Allergen Reactions   Augmentin [Amoxicillin-Pot Clavulanate] Diarrhea and Other (See Comments)    Severe diarrhea   Mucinex Fast-Max Other (See Comments)    Intense sweating    Amphetamines Other (See Comments)    Unknown reaction    Chief Complaint  Patient presents with   Medical Management of Chronic Issues    Patient presents today for a 2 month follow-up.   Quality Metric Gaps    A1C, foot exam     HPI: Patient is a 70 y.o. male 78-month visit.  I saw him 2 months ago and he was telling me that his quality of life was poor for multiple reasons not the least of which was chronic back pain.  He does not have an operative K[place] but lamented that nobody would really treat his back pain.  He also has end-stage renal disease and as it is on dialysis, has human immunodeficiency syndrome, type 2 diabetes, COPD, CHF and atrial fibs.  He is followed by multiple specialties including nephrology and infectious disease GI.  Really has done well recently.  Oxycodone for his back pain is effective he takes about 3/day sometimes less but he has not requested refills before appropriate time to do so.  He has an appointment next week with GI doctor for some black stools.  Has just finished a course of therapy for H. pylori.  Also has a history of AVM for which she  gets injections every 28 days.  There is also a history of anemia mostly secondary to chronic kidney disease.  That is managed by nephrology.  Review of Systems:  Review of Systems  Constitutional: Negative.   HENT: Negative.    Eyes: Negative.   Respiratory: Negative.    Cardiovascular: Negative.   Gastrointestinal:  Positive for melena.  Musculoskeletal:  Positive for back pain.  Skin: Negative.   All other systems reviewed and are negative.  Past Medical History:  Diagnosis Date   Acute respiratory failure ([place]) 03/01/2018   Anemia    Arthritis    "all over; mostly knees and back" (02/28/2018)   Chronic combined systolic and diastolic CHF (congestive heart failure) (HCC)    Chronic lower back pain    stenosis   Community acquired pneumonia 09/06/2013   COPD (chronic obstructive pulmonary disease) ([place])    Coronary atherosclerosis of native coronary artery    a. 02/2003 s/p CABG x 2 (VG->RI, VG->RPDA; b. 11/2019 PCI: LM nl, LAD 90d, D3 50, RI 100, LCX 100p, OM3 100 - fills via L->L collats from D2/dLAD, RCA 100p, VG->RPDA ok, VG->RI 95 (3.5x48 Synergy XD DES).   Drug abuse ([place])    hx; tested for cocaine as recently as 2/08. says he is not using drugs now - avoided defib. for this reason    ESRD (end stage renal disease) ([place])    Hemo M-W-F- [facility]   Fall at home 10/2020   GERD (gastroesophageal  reflux disease)    takes OTC meds as needed   GI bleeding    a. 11/2019 EGD: angiodysplastic lesions w/ bleeding s/p argon plasma/clipping/epi inj. Multiple admissions for the same.   Glaucoma    uses eye drops daily   Hepatitis B 1968   "tx'd w/isolation; caught it from toilet stools in gym"   History of blood transfusion 03/01/2019   History of colon polyps    benign   History of gout    takes Allopurinol daily as well as Colchicine-if needed (02/28/2018)   History of kidney stones    HTN (hypertension)    takes Coreg,Imdur.and Apresoline daily   Human immunodeficiency virus (HIV)  disease ([place]) dx'd 1995   on [place] as of 12/2020.     Hyperlipidemia    Ischemic cardiomyopathy    a. 01/2019 Echo: EF 40-45%, diffuse HK, mild basal septal hypertrophy. Diast dysfxn. Nl RV size/fxn. Sev dil LA. Triv MR/TR/PR.   Muscle spasm    takes Zanaflex as needed   Myocardial infarction ([place]) ~ 2004/2005   Nocturia    Peripheral neuropathy    takes gabapentin daily   Pneumonia    "at least twice" (02/28/2018)   SDH (subdural hematoma)    Syphilis, unspecified    Type II diabetes mellitus ([place]) 2004   Lantus daily.Average fasting blood sugar 125-199   Wears glasses    Wears partial dentures    Past Surgical History:  Procedure Laterality Date   AV FISTULA PLACEMENT Left 08/02/2018   Procedure: ARTERIOVENOUS (AV) FISTULA CREATION  left arm radiocephlic;  Surgeon: Marty Heck, MD;  Location: [place];  Service: Vascular;  Laterality: Left;   AV FISTULA PLACEMENT Left 08/01/2019   Procedure: LEFT BRACHIOCEPHALIC ARTERIOVENOUS (AV) FISTULA CREATION;  Surgeon: [name], MD;  Location: [place];  Service: Vascular;  Laterality: Left;   [place] Left 10/03/2019   Procedure: BASILIC VEIN TRANSPOSITION LEFT SECOND STAGE;  Surgeon: [name], MD;  Location: [facility];  Service: Vascular;  Laterality: Left;   BIOPSY  01/25/2021   Procedure: BIOPSY;  Surgeon: Doran Stabler, MD;  Location: [facility] ENDOSCOPY;  Service: Gastroenterology;;   CARDIAC CATHETERIZATION  10/2002; 12/19/2004   [name] 03/08/2011   COLONOSCOPY  2013   [place]    CORONARY ARTERY BYPASS GRAFT  02/24/2003   CABG X2/notes 03/08/2011   CORONARY STENT INTERVENTION N/A 12/19/2019   Procedure: CORONARY STENT INTERVENTION;  Surgeon: Jettie Booze, MD;  Location: [place] CV LAB;  Service: Cardiovascular;  Laterality: N/A;   ENTEROSCOPY N/A 01/25/2021   Procedure: ENTEROSCOPY;  Surgeon: Doran Stabler, MD;  Location: [place];  Service: Gastroenterology;  Laterality: N/A;    ENTEROSCOPY N/A 02/13/2021   Procedure: ENTEROSCOPY;  Surgeon: Jackquline Denmark, MD;  Location[facility] ENDOSCOPY;  Service: Endoscopy;  Laterality: N/A;   ENTEROSCOPY N/A 05/07/2021   Procedure: ENTEROSCOPY;  Surgeon: Yetta Flock, MD;  Location: [facility] ENDOSCOPY;  Service: Gastroenterology;  Laterality: N/A;   ESOPHAGOGASTRODUODENOSCOPY (EGD) WITH PROPOFOL N/A 02/08/2019   Procedure: ESOPHAGOGASTRODUODENOSCOPY (EGD) WITH PROPOFOL;  Surgeon: Milus Banister, MD;  Location: [place];  Service: Gastroenterology;  Laterality: N/A;   ESOPHAGOGASTRODUODENOSCOPY (EGD) WITH PROPOFOL N/A 12/22/2019   Procedure: ESOPHAGOGASTRODUODENOSCOPY (EGD) WITH PROPOFOL;  Surgeon: Lavena Bullion, DO;  Location: [place];  Service: Gastroenterology;  Laterality: N/A;   ESOPHAGOGASTRODUODENOSCOPY (EGD) WITH PROPOFOL N/A 10/19/2020   Procedure: ESOPHAGOGASTRODUODENOSCOPY (EGD) WITH PROPOFOL;  Surgeon: Jackquline Denmark, MD;  Location[facility] ENDOSCOPY;  Service: Endoscopy;  Laterality: N/A;   ESOPHAGOGASTRODUODENOSCOPY (EGD) WITH PROPOFOL N/A 12/22/2020   Procedure: ESOPHAGOGASTRODUODENOSCOPY (EGD) WITH PROPOFOL;  Surgeon: Gatha Mayer, MD;  Location: [place];  Service: Endoscopy;  Laterality: N/A;   ESOPHAGOGASTRODUODENOSCOPY (EGD) WITH PROPOFOL N/A 01/09/2021   Procedure: ESOPHAGOGASTRODUODENOSCOPY (EGD) WITH PROPOFOL;  Surgeon: Irene Shipper, MD;  Location: [facility] ENDOSCOPY;  Service: Endoscopy;  Laterality: N/A;   HEMOSTASIS CLIP PLACEMENT  12/22/2019   Procedure: HEMOSTASIS CLIP PLACEMENT;  Surgeon: Lavena Bullion, DO;  Location: [place] ENDOSCOPY;  Service: Gastroenterology;;   HEMOSTASIS CLIP PLACEMENT  12/22/2020   Procedure: HEMOSTASIS CLIP PLACEMENT;  Surgeon: Gatha Mayer, MD;  Location: [facility] ENDOSCOPY;  Service: Endoscopy;;   HEMOSTASIS CONTROL  12/22/2020   Procedure: HEMOSTASIS CONTROL/hemospray;  Surgeon: Gatha Mayer, MD;  Location: [place];  Service: Endoscopy;;   HOT HEMOSTASIS N/A 02/08/2019    Procedure: HOT HEMOSTASIS (ARGON PLASMA COAGULATION/BICAP);  Surgeon: Milus Banister, MD;  Location: [facility] ENDOSCOPY;  Service: Gastroenterology;  Laterality: N/A;   HOT HEMOSTASIS N/A 12/22/2019   Procedure: HOT HEMOSTASIS (ARGON PLASMA COAGULATION/BICAP);  Surgeon: Lavena Bullion, DO;  Location: [facility] ENDOSCOPY;  Service: Gastroenterology;  Laterality: N/A;   HOT HEMOSTASIS N/A 10/19/2020   Procedure: HOT HEMOSTASIS (ARGON PLASMA COAGULATION/BICAP);  Surgeon: Jackquline Denmark, MD;  Location: [facility] ENDOSCOPY;  Service: Endoscopy;  Laterality: N/A;   HOT HEMOSTASIS N/A 12/22/2020   Procedure: HOT HEMOSTASIS (ARGON PLASMA COAGULATION/BICAP);  Surgeon: Gatha Mayer, MD;  Location: [facility] ENDOSCOPY;  Service: Endoscopy;  Laterality: N/A;   HOT HEMOSTASIS N/A 01/09/2021   Procedure: HOT HEMOSTASIS (ARGON PLASMA COAGULATION/BICAP);  Surgeon: Irene Shipper, MD;  Location: [facility] ENDOSCOPY;  Service: Endoscopy;  Laterality: N/A;   HOT HEMOSTASIS N/A 01/25/2021   Procedure: HOT HEMOSTASIS (ARGON PLASMA COAGULATION/BICAP);  Surgeon: Doran Stabler, MD;  Location: [place];  Service: Gastroenterology;  Laterality: N/A;   HOT HEMOSTASIS N/A 02/13/2021   Procedure: HOT HEMOSTASIS (ARGON PLASMA COAGULATION/BICAP);  Surgeon: Jackquline Denmark, MD;  Location: [facility] ENDOSCOPY;  Service: Endoscopy;  Laterality: N/A;   HOT HEMOSTASIS N/A 05/07/2021   Procedure: HOT HEMOSTASIS (ARGON PLASMA COAGULATION/BICAP);  Surgeon: Yetta Flock, MD;  Location: [facility] ENDOSCOPY;  Service: Gastroenterology;  Laterality: N/A;   INTERTROCHANTERIC HIP FRACTURE SURGERY Left 11/2006   [name] 03/08/2011   INTRAVASCULAR ULTRASOUND/IVUS N/A 12/19/2019   Procedure: Intravascular Ultrasound/IVUS;  Surgeon: Jettie Booze, MD;  Location: [place] CV LAB;  Service: Cardiovascular;  Laterality: N/A;   IR FLUORO GUIDE CV LINE RIGHT  07/24/2019   IR FLUORO GUIDE CV LINE RIGHT  07/30/2019   IR US GUIDE VASC ACCESS RIGHT  07/24/2019   IR US GUIDE VASC  ACCESS RIGHT  07/30/2019   LAPAROSCOPIC CHOLECYSTECTOMY  05/2006   LIGATION OF COMPETING BRANCHES OF ARTERIOVENOUS FISTULA Left 11/05/2018   Procedure: LIGATION OF COMPETING BRANCHES OF ARTERIOVENOUS FISTULA  LEFT  ARM;  Surgeon: Marty Heck, MD;  Location: [facility] OR;  Service: Vascular;  Laterality: Left;   LUMBAR LAMINECTOMY/DECOMPRESSION MICRODISCECTOMY N/A 02/29/2016   Procedure: Left L4-5 Lateral Recess Decompression, Removal Extradural Intraspinal Facet Cyst;  Surgeon: Marybelle Killings, MD;  Location: [place];  Service: Orthopedics;  Laterality: N/A;   MULTIPLE TOOTH EXTRACTIONS     ORIF MANDIBULAR FRACTURE Left 08/13/2004   ORIF of left body fracture mandible with KLS Martin 2.3-mm six hole/notes 03/08/2011   RIGHT/LEFT HEART CATH AND CORONARY/GRAFT ANGIOGRAPHY N/A 12/19/2019   Procedure: RIGHT/LEFT HEART CATH AND CORONARY/GRAFT ANGIOGRAPHY;  Surgeon: Larae Grooms  S, MD;  Location: [place] CV LAB;  Service: Cardiovascular;  Laterality: N/A;   SCLEROTHERAPY  12/22/2019   Procedure: SCLEROTHERAPY;  Surgeon: Lavena Bullion, DO;  Location: [place];  Service: Gastroenterology;;   Clide Deutscher  02/13/2021   Procedure: Clide Deutscher;  Surgeon: Jackquline Denmark, MD;  Location: [facility] ENDOSCOPY;  Service: Endoscopy;;   Social History:   reports that he has been smoking cigarettes. He has a 21.50 pack-year smoking history. He has never used smokeless tobacco. He reports that he does not currently use alcohol after a past usage of about 12.0 standard drinks per week. He reports that he does not currently use drugs after having used the following drugs: Cocaine.  Family History  Problem Relation Age of Onset   Heart failure Father    Hypertension Father    Diabetes Brother    Heart attack Brother    Alzheimer's disease Mother    Stroke Sister    Diabetes Sister    Alzheimer's disease Sister    Hypertension Brother    Diabetes Brother    Drug abuse Brother    Colon cancer Neg Hx      Medications: Patient's Medications  New Prescriptions   No medications on file  Previous Medications   ACETAMINOPHEN (TYLENOL) 650 MG CR TABLET    Take 1,300 mg by mouth 3 (three) times daily.   ALLOPURINOL (ZYLOPRIM) 100 MG TABLET    TAKE 1 TABLET(100 MG) BY MOUTH DAILY   ANORO ELLIPTA 62.5-25 MCG/INH AEPB    INHALE 1 PUFF BY MOUTH EVERY DAY   ATORVASTATIN (LIPITOR) 10 MG TABLET    Take 1 tablet (10 mg total) by mouth daily.   B-D UF III MINI PEN NEEDLES 31G X 5 MM MISC    USE FOUR TIMES DAILY   BIKTARVY 50-200-25 MG TABS TABLET    Take 1 tablet by mouth daily.   BISMUTH-METRONIDAZOLE-TETRACYCLINE (PYLERA) 140-125-125 MG CAPSULE    Take 3 capsules by mouth 4 (four) times daily -  before meals and at bedtime.   CALCIUM ACETATE 667 MG TABS    Take 667-1,334 mg by mouth See admin instructions. Take 1,334 mg by mouth three times a day with meals and 667 mg with each snack   CARVEDILOL (COREG) 3.125 MG TABLET    TAKE 1 TABLET(3.125 MG) BY MOUTH TWICE DAILY   COLCHICINE 0.6 MG TABLET    TAKE 1 TABLET BY MOUTH AS NEEDED FOR GOUT   DICLOFENAC SODIUM (VOLTAREN) 1 % GEL    APPLY 2 GRAMS TOPICALLY TO THE AFFECTED AREA THREE TIMES DAILY AS NEEDED FOR PAIN   FOLIC ACID (FOLVITE) 1 MG TABLET    TAKE 1 TABLET(1 MG) BY MOUTH DAILY   GABAPENTIN (NEURONTIN) 300 MG CAPSULE    TAKE 1 CAPSULE BY MOUTH DAILY. DOSE CAHNGE DUE TO RENAL FUNCTION   GLUCOSE BLOOD (ONETOUCH ULTRA) TEST STRIP    Check blood sugar three times daily. Dx:E11.40   INSULIN LISPRO (HUMALOG) 100 UNIT/ML [id number]    INJECT 13 UNITS UNDER THE SKIN THREE TIMES DAILY AS NEEDED FOR HIGH BLOOD SUGAR(ABOVE 150)   ISOSORBIDE MONONITRATE (IMDUR) 30 MG 24 HR TABLET    TAKE 1 TABLET BY MOUTH EVERY DAY   LANCETS (ONETOUCH DELICA PLUS [id number]) MISC    1 Device by Other route 3 (three) times daily. E11.42   LATANOPROST (XALATAN) 0.005 % OPHTHALMIC SOLUTION    Place 1 drop into both eyes at bedtime.   LIDOCAINE-PRILOCAINE (EMLA) CREAM    Apply  1  application topically every Monday, Wednesday, and Friday with hemodialysis.   METHOXY PEG-EPOETIN BETA (MIRCERA IJ)    Mircera   MULTIVITAMIN (RENA-VIT) TABS TABLET    Take 1 tablet by mouth daily.   NITROGLYCERIN (NITROSTAT) 0.3 MG SL TABLET    PLACE 1 TABLET UNDER THE TONGUE AS NEEDED FOR CHEST PAIN   OCTREOTIDE (SANDOSTATIN LAR) 10 MG INJECTION    Inject 10 mg into the muscle every 28 (twenty-eight) days.   ONDANSETRON (ZOFRAN) 4 MG TABLET    Take 1 tablet (4 mg total) by mouth every 8 (eight) hours as needed for nausea or vomiting.   OXYCODONE-ACETAMINOPHEN (PERCOCET/ROXICET) 5-325 MG TABLET    Take 1 tablet by mouth every 4 (four) hours as needed for severe pain.   PANTOPRAZOLE (PROTONIX) 40 MG TABLET    Take 1 tablet (40 mg total) by mouth 2 (two) times daily before a meal.   POLYETHYLENE GLYCOL (MIRALAX / GLYCOLAX) 17 G PACKET    Take 17 g by mouth daily as needed for mild constipation.   TIMOLOL (TIMOPTIC) 0.5 % OPHTHALMIC SOLUTION    Place 1 drop into both eyes in the morning.   VITAMIN D PO    Take by mouth.  Modified Medications   No medications on file  Discontinued Medications   No medications on file    Physical Exam:  Vitals:   10/19/21 1042  BP: (!) 148/56  Pulse: 72  Temp: (!) 96.6 F (35.9 C)  SpO2: 98%  Weight: 175 lb (79.4 kg)  Height: 5\' 11"  (1.803 m)   Body mass index is 24.41 kg/m. Wt Readings from Last 3 Encounters:  10/19/21 175 lb (79.4 kg)  09/21/21 169 lb (76.7 kg)  09/07/21 178 lb (80.7 kg)    Physical Exam Vitals and nursing note reviewed.  Constitutional:      Appearance: Normal appearance.  Cardiovascular:     Rate and Rhythm: Normal rate and regular rhythm.  Pulmonary:     Effort: Pulmonary effort is normal.     Breath sounds: Normal breath sounds.  Abdominal:     General: Abdomen is flat. Bowel sounds are normal.     Palpations: Abdomen is soft.  Neurological:     General: No focal deficit present.     Mental Status: He is alert  and oriented to person, place, and time.  Psychiatric:        Behavior: Behavior normal.    Labs reviewed: Basic Metabolic Panel: Recent Labs    12/15/20 1105 12/21/20 0834 05/03/21 2135 05/04/21 0734 05/05/21 0308 05/06/21 1056 05/07/21 0406 06/23/21 1452 06/24/21 0800 07/08/21 1929  NA 138   < > 136 136   < > 135 134* 135 133* 131*  K 4.0   < > 4.4 5.4*   < > 4.0 4.0 6.3* 6.6* 4.3  CL 95*   < > 98 100   < > 98 98 96* 98 91*  CO2 29   < > 27 25   < > 26 26 22 22 27   GLUCOSE 112*   < > 147* 117*   < > 132* 110* 158* 133* 173*  BUN 51*   < > 96* 115*   < > 70* 79* 123* 128* 51*  CREATININE 5.77*   < > 5.98* 6.96*   < > 6.67* 7.71* 10.85* 11.14* 6.74*  CALCIUM 8.7   < > 8.0* 7.8*   < > 7.5* 7.9* 9.0 9.3 9.1  MG  --    < >  2.1 2.1  --   --  1.9  --   --   --   PHOS  --    < > 4.2 5.3*  --  5.1* 5.9*  --  5.2*  --   TSH 0.49  --   --   --   --   --   --   --   --   --    < > = values in this interval not displayed.   Liver Function Tests: Recent Labs    05/03/21 0046 05/06/21 1056 05/13/21 0844 06/23/21 1452 06/24/21 0800  AST 25  --  17 22  --   ALT 15  --  12 18  --   ALKPHOS 31*  --  63 60  --   BILITOT 0.7  --  0.3 0.4  --   PROT 6.0*  --  7.2 7.7  --   ALBUMIN 2.8*   < > 4.0 2.6* 2.5*   < > = values in this interval not displayed.   Recent Labs    12/21/20 0834 05/02/21 0743  LIPASE 73* 87*   Recent Labs    05/05/21 1708  AMMONIA 49*   CBC: Recent Labs    02/25/21 1317 05/02/21 0743 05/03/21 0046 05/03/21 0101 06/23/21 1452 06/24/21 0800 07/08/21 1929  WBC 6.6   < > 6.5   < > 8.2 7.1 4.7  NEUTROABS 3,967  --  4.7  --  6.0  --   --   HGB 10.4*   < > 7.0*   < > 8.2* 7.7* 7.9*  HCT 33.1*   < > 22.7*   < > 25.7* 24.1* 25.6*  MCV 93.2   < > 92.3   < > 97.3 94.9 97.3  PLT 198   < > 101*   < > 228 227 214   < > = values in this interval not displayed.   Lipid Panel: Recent Labs    12/15/20 1105 05/13/21 0844  CHOL 143 111  HDL 31* 32*   LDLCALC 79 58  TRIG 248* 111  CHOLHDL 4.6 3.5   TSH: Recent Labs    12/15/20 1105  TSH 0.49   A1C: Lab Results  Component Value Date   HGBA1C 5.8 (H) 05/03/2021     Assessment/Plan  1. Acute on chronic anemia Most recent hemoglobin 14.4 earlier this month.  Followed by nephrology  2. Angiodysplasia of intestine with hemorrhage Source of GI blood loss.  He does receive injections every 4 weeks of octreotide  3. Congestive heart failure, unspecified HF chronicity, unspecified heart failure type ([place]) Appears to be well compensated at this time.  He has no symptoms  4. Helicobacter pylori (H. pylori) infection Status posttreatment but has follow-up appointment next week with GI 5. Human immunodeficiency virus (HIV) disease ([place]) Stable on Biktarvy and followed by infectious disease    6. Chronic bilateral low back pain without sciatica Symptoms are well managed with oxycodone.  Attempted to get urine drug screen today but patient could not void plan to check drug screen at next visit in 2 months   Alain Honey, MD [place] [phone]

## 2021-10-20 ENCOUNTER — Telehealth: Payer: Self-pay | Admitting: Family Medicine

## 2021-10-20 DIAGNOSIS — N186 End stage renal disease: Secondary | ICD-10-CM | POA: Diagnosis not present

## 2021-10-20 DIAGNOSIS — D631 Anemia in chronic kidney disease: Secondary | ICD-10-CM | POA: Diagnosis not present

## 2021-10-20 DIAGNOSIS — D509 Iron deficiency anemia, unspecified: Secondary | ICD-10-CM | POA: Diagnosis not present

## 2021-10-20 DIAGNOSIS — Z992 Dependence on renal dialysis: Secondary | ICD-10-CM | POA: Diagnosis not present

## 2021-10-20 DIAGNOSIS — N2581 Secondary hyperparathyroidism of renal origin: Secondary | ICD-10-CM | POA: Diagnosis not present

## 2021-10-20 NOTE — Telephone Encounter (Signed)
Patient called and wanted it noted that his A1C checked and is going to be doing dialysis every month. Patient also requests a note from provider requesting he be moved from the second floor.

## 2021-10-22 DIAGNOSIS — N186 End stage renal disease: Secondary | ICD-10-CM | POA: Diagnosis not present

## 2021-10-22 DIAGNOSIS — D631 Anemia in chronic kidney disease: Secondary | ICD-10-CM | POA: Diagnosis not present

## 2021-10-22 DIAGNOSIS — D509 Iron deficiency anemia, unspecified: Secondary | ICD-10-CM | POA: Diagnosis not present

## 2021-10-22 DIAGNOSIS — Z992 Dependence on renal dialysis: Secondary | ICD-10-CM | POA: Diagnosis not present

## 2021-10-22 DIAGNOSIS — N2581 Secondary hyperparathyroidism of renal origin: Secondary | ICD-10-CM | POA: Diagnosis not present

## 2021-10-23 DIAGNOSIS — Z992 Dependence on renal dialysis: Secondary | ICD-10-CM | POA: Diagnosis not present

## 2021-10-23 DIAGNOSIS — N186 End stage renal disease: Secondary | ICD-10-CM | POA: Diagnosis not present

## 2021-10-23 DIAGNOSIS — I129 Hypertensive chronic kidney disease with stage 1 through stage 4 chronic kidney disease, or unspecified chronic kidney disease: Secondary | ICD-10-CM | POA: Diagnosis not present

## 2021-10-25 DIAGNOSIS — Z992 Dependence on renal dialysis: Secondary | ICD-10-CM | POA: Diagnosis not present

## 2021-10-25 DIAGNOSIS — N2581 Secondary hyperparathyroidism of renal origin: Secondary | ICD-10-CM | POA: Diagnosis not present

## 2021-10-25 DIAGNOSIS — D631 Anemia in chronic kidney disease: Secondary | ICD-10-CM | POA: Diagnosis not present

## 2021-10-25 DIAGNOSIS — N186 End stage renal disease: Secondary | ICD-10-CM | POA: Diagnosis not present

## 2021-10-25 DIAGNOSIS — E1122 Type 2 diabetes mellitus with diabetic chronic kidney disease: Secondary | ICD-10-CM | POA: Diagnosis not present

## 2021-10-27 ENCOUNTER — Ambulatory Visit: Payer: Medicare Other | Admitting: Internal Medicine

## 2021-10-27 DIAGNOSIS — E1122 Type 2 diabetes mellitus with diabetic chronic kidney disease: Secondary | ICD-10-CM | POA: Diagnosis not present

## 2021-10-27 DIAGNOSIS — Z992 Dependence on renal dialysis: Secondary | ICD-10-CM | POA: Diagnosis not present

## 2021-10-27 DIAGNOSIS — N2581 Secondary hyperparathyroidism of renal origin: Secondary | ICD-10-CM | POA: Diagnosis not present

## 2021-10-27 DIAGNOSIS — D631 Anemia in chronic kidney disease: Secondary | ICD-10-CM | POA: Diagnosis not present

## 2021-10-27 DIAGNOSIS — N186 End stage renal disease: Secondary | ICD-10-CM | POA: Diagnosis not present

## 2021-10-28 ENCOUNTER — Encounter (HOSPITAL_COMMUNITY): Payer: Self-pay

## 2021-10-28 ENCOUNTER — Encounter: Payer: Self-pay | Admitting: Internal Medicine

## 2021-10-28 ENCOUNTER — Encounter (HOSPITAL_COMMUNITY): Payer: Medicare Other

## 2021-10-28 ENCOUNTER — Ambulatory Visit (INDEPENDENT_AMBULATORY_CARE_PROVIDER_SITE_OTHER): Payer: Commercial Managed Care - HMO | Admitting: Internal Medicine

## 2021-10-28 ENCOUNTER — Other Ambulatory Visit: Payer: Self-pay

## 2021-10-28 DIAGNOSIS — B20 Human immunodeficiency virus [HIV] disease: Secondary | ICD-10-CM

## 2021-10-28 DIAGNOSIS — D649 Anemia, unspecified: Secondary | ICD-10-CM

## 2021-10-28 DIAGNOSIS — A048 Other specified bacterial intestinal infections: Secondary | ICD-10-CM

## 2021-10-28 MED ORDER — BIKTARVY 50-200-25 MG PO TABS: 1.0000 | ORAL_TABLET | Freq: Every day | ORAL | 11 refills | Status: DC

## 2021-10-28 NOTE — Progress Notes (Signed)
Patient Active Problem List   Diagnosis Date Noted   Helicobacter pylori (H. pylori) infection 04/06/2021    Priority: High   Human immunodeficiency virus (HIV) disease ([place]) 09/02/2006    Priority: High   Chronic lower back pain    Thrombocytopenia (HCC)    Hypercalcemia 04/13/2021   Lumbar foraminal stenosis 03/30/2021   Gastric hemorrhage due to angiodysplasia of stomach    GI bleed 11/24/2020   COVID-19 virus infection 11/24/2020   Other mechanical complication of cranial or spinal infusion catheter, sequela 11/11/2020   Subdural hematoma 11/06/2020   Fall at home, initial encounter 11/05/2020   Nicotine dependence, cigarettes, uncomplicated 27/03/2375   Alcohol abuse 11/05/2020   Unspecified trochanteric fracture of left femur, initial encounter for closed fracture ([place]) 11/05/2020   Traumatic subdural hematoma, initial encounter 11/05/2020   Fracture of metatarsal of right foot, closed 11/05/2020   Nondisplaced fracture of greater trochanter of left femur, initial encounter for closed fracture ([place])    Uremia    Angiodysplasia of intestine with hemorrhage    Pruritus, unspecified 10/02/2020   Iron deficiency anemia    Acute blood loss anemia    Acute on chronic anemia    Abnormal nuclear stress test 12/19/2019   Type 2 diabetes mellitus with diabetic neuropathy, unspecified ([place]) 08/07/2019   Allergy, unspecified, initial encounter 28/31/5176   Complication of vascular dialysis catheter 08/02/2019   Drug abuse counseling and surveillance of drug abuser 08/02/2019   Other specified coagulation defects ([place]) 08/02/2019   Secondary hyperparathyroidism of renal origin ([place]) 08/02/2019   Unspecified atrial fibrillation ([place]) 08/02/2019   ESRD on hemodialysis ([place])    Cocaine abuse ([place])    CHF exacerbation ([place]) 07/21/2019   Diabetic foot ulcer ([place]) 02/11/2019   AVM (arteriovenous malformation)    Melena 02/07/2019   Symptomatic anemia 02/06/2019    Hyperlipidemia associated with type 2 diabetes mellitus ([place]) 05/30/2018   Right foot ulcer ([place]) 02/28/2018   Chronic obstructive pulmonary disease ([place]) 02/28/2018   Anemia of chronic disease 11/20/2016   CHF (congestive heart failure) ([place]) 11/10/2016   History of lumbar laminectomy for spinal cord decompression 02/29/2016   Type 2 diabetes mellitus with diabetic polyneuropathy, with long-term current use of insulin ([place]) 06/17/2015   HTN (hypertension) 04/26/2015   DM (diabetes mellitus) ([place]) 04/26/2015   Ischemic cardiomyopathy 05/12/2014   Bunion of left foot 11/25/2011   Bunion, right foot 11/25/2011   Polysubstance abuse ([place]) 07/28/2011   Hip fracture, left ([place]) 04/04/2011   Closed fracture of neck of femur ([place]) 04/04/2011   Insomnia 02/18/2011   Coronary artery disease involving native heart without angina pectoris 04/20/2009   Chronic combined systolic (congestive) and diastolic (congestive) heart failure ([place]) 04/20/2009   Mixed hyperlipidemia 11/20/2006   Gout 11/20/2006   TOBACCO ABUSE 11/20/2006   Essential hypertension 11/20/2006    Patient's Medications  New Prescriptions   No medications on file  Previous Medications   ACETAMINOPHEN (TYLENOL) 650 MG CR TABLET    Take 1,300 mg by mouth 3 (three) times daily.   ALLOPURINOL (ZYLOPRIM) 100 MG TABLET    TAKE 1 TABLET(100 MG) BY MOUTH DAILY   ANORO ELLIPTA 62.5-25 MCG/INH AEPB    INHALE 1 PUFF BY MOUTH EVERY DAY   ATORVASTATIN (LIPITOR) 10 MG TABLET    Take 1 tablet (10 mg total) by mouth daily.   B-D UF III MINI PEN NEEDLES 31G X 5 MM MISC    USE  FOUR TIMES DAILY   CALCIUM ACETATE 667 MG TABS    Take 667-1,334 mg by mouth See admin instructions. Take 1,334 mg by mouth three times a day with meals and 667 mg with each snack   CARVEDILOL (COREG) 3.125 MG TABLET    TAKE 1 TABLET(3.125 MG) BY MOUTH TWICE DAILY   COLCHICINE 0.6 MG TABLET    TAKE 1 TABLET BY MOUTH AS NEEDED FOR GOUT   DICLOFENAC SODIUM (VOLTAREN) 1 % GEL     APPLY 2 GRAMS TOPICALLY TO THE AFFECTED AREA THREE TIMES DAILY AS NEEDED FOR PAIN   FOLIC ACID (FOLVITE) 1 MG TABLET    TAKE 1 TABLET(1 MG) BY MOUTH DAILY   GABAPENTIN (NEURONTIN) 300 MG CAPSULE    TAKE 1 CAPSULE BY MOUTH DAILY. DOSE CAHNGE DUE TO RENAL FUNCTION   GLUCOSE BLOOD (ONETOUCH ULTRA) TEST STRIP    Check blood sugar three times daily. Dx:E11.40   INSULIN LISPRO (HUMALOG) 100 UNIT/ML KWIKPEN    INJECT 13 UNITS UNDER THE SKIN THREE TIMES DAILY AS NEEDED FOR HIGH BLOOD SUGAR(ABOVE 150)   ISOSORBIDE MONONITRATE (IMDUR) 30 MG 24 HR TABLET    TAKE 1 TABLET BY MOUTH EVERY DAY   LANCETS (ONETOUCH DELICA PLUS IOXBDZ32D) MISC    1 Device by Other route 3 (three) times daily. E11.42   LATANOPROST (XALATAN) 0.005 % OPHTHALMIC SOLUTION    Place 1 drop into both eyes at bedtime.   LIDOCAINE-PRILOCAINE (EMLA) CREAM    Apply 1 application topically every Monday, Wednesday, and Friday with hemodialysis.   METHOXY PEG-EPOETIN BETA (MIRCERA IJ)    Mircera   MULTIVITAMIN (RENA-VIT) TABS TABLET    Take 1 tablet by mouth daily.   NITROGLYCERIN (NITROSTAT) 0.3 MG SL TABLET    PLACE 1 TABLET UNDER THE TONGUE AS NEEDED FOR CHEST PAIN   OCTREOTIDE (SANDOSTATIN LAR) 10 MG INJECTION    Inject 10 mg into the muscle every 28 (twenty-eight) days.   ONDANSETRON (ZOFRAN) 4 MG TABLET    Take 1 tablet (4 mg total) by mouth every 8 (eight) hours as needed for nausea or vomiting.   OXYCODONE-ACETAMINOPHEN (PERCOCET/ROXICET) 5-325 MG TABLET    Take 1 tablet by mouth every 4 (four) hours as needed for severe pain.   PANTOPRAZOLE (PROTONIX) 40 MG TABLET    Take 1 tablet (40 mg total) by mouth 2 (two) times daily before a meal.   POLYETHYLENE GLYCOL (MIRALAX / GLYCOLAX) 17 G PACKET    Take 17 g by mouth daily as needed for mild constipation.   TIMOLOL (TIMOPTIC) 0.5 % OPHTHALMIC SOLUTION    Place 1 drop into both eyes in the morning.   VITAMIN D PO    Take by mouth.  Modified Medications   Modified Medication Previous  Medication   BIKTARVY 50-200-25 MG TABS TABLET BIKTARVY 50-200-25 MG TABS tablet      Take 1 tablet by mouth daily.    Take 1 tablet by mouth daily.  Discontinued Medications   BISMUTH-METRONIDAZOLE-TETRACYCLINE (PYLERA) 140-125-125 MG CAPSULE    Take 3 capsules by mouth 4 (four) times daily -  before meals and at bedtime.    Subjective: Jacob [name] is in for his routine follow-up visit.  He denies any problems obtaining, taking or tolerating his Biktarvy and does not recall missing any doses.  His infection has been under excellent, long-term control.  His more pressing problem has been an upper GI bleed in April of last year.  He was found to have Helicobacter pylori  gastritis.  He has now been through 3 rounds of treatment and has failed each 1.  His previous treatments and repeat testing are listed below.  Metronidazole, doxycycline and omeprazole April 7062  Helicobacter breath test + May 2022 2.  Clarithromycin, amoxicillin and omeprazole June 3762  Helicobacter breath test + August 2022 3.  Bismuth, tetracycline, metronidazole and pantoprazole September 8315  Helicobacter breath test + December 2022  He tells me that he did stop pantoprazole at the time of his last visit and has remained off of it ever since.  He is not having symptoms of acid reflux and he has not noted any evidence of repeat GI bleeding. Review of Systems: Review of Systems  Constitutional:  Negative for fever and weight loss.  Gastrointestinal:  Negative for abdominal pain, blood in stool, heartburn, nausea and vomiting.  Psychiatric/Behavioral:  Negative for depression.    Past Medical History:  Diagnosis Date   Acute respiratory failure ([place]) 03/01/2018   Anemia    Arthritis    "all over; mostly knees and back" (02/28/2018)   Chronic combined systolic and diastolic CHF (congestive heart failure) (HCC)    Chronic lower back pain    stenosis   Community acquired pneumonia 09/06/2013   COPD (chronic obstructive  pulmonary disease) ([place])    Coronary atherosclerosis of native coronary artery    a. 02/2003 s/p CABG x 2 (VG->RI, VG->RPDA; b. 11/2019 PCI: LM nl, LAD 90d, D3 50, RI 100, LCX 100p, OM3 100 - fills via L->L collats from D2/dLAD, RCA 100p, VG->RPDA ok, VG->RI 95 (3.5x48 Synergy XD DES).   Drug abuse ([place])    hx; tested for cocaine as recently as 2/08. says he is not using drugs now - avoided defib. for this reason    ESRD (end stage renal disease) ([place])    Hemo M-W-F- [facility]   Fall at home 10/2020   GERD (gastroesophageal reflux disease)    takes OTC meds as needed   GI bleeding    a. 11/2019 EGD: angiodysplastic lesions w/ bleeding s/p argon plasma/clipping/epi inj. Multiple admissions for the same.   Glaucoma    uses eye drops daily   Hepatitis B 1968   "tx'd w/isolation; caught it from toilet stools in gym"   History of blood transfusion 03/01/2019   History of colon polyps    benign   History of gout    takes Allopurinol daily as well as Colchicine-if needed (02/28/2018)   History of kidney stones    HTN (hypertension)    takes Coreg,Imdur.and Apresoline daily   Human immunodeficiency virus (HIV) disease ([place]) dx'd 1995   on [place] as of 12/2020.     Hyperlipidemia    Ischemic cardiomyopathy    a. 01/2019 Echo: EF 40-45%, diffuse HK, mild basal septal hypertrophy. Diast dysfxn. Nl RV size/fxn. Sev dil LA. Triv MR/TR/PR.   Muscle spasm    takes Zanaflex as needed   Myocardial infarction ([place]) ~ 2004/2005   Nocturia    Peripheral neuropathy    takes gabapentin daily   Pneumonia    "at least twice" (02/28/2018)   SDH (subdural hematoma)    Syphilis, unspecified    Type II diabetes mellitus ([place]) 2004   Lantus daily.Average fasting blood sugar 125-199   Wears glasses    Wears partial dentures     Social History   Tobacco Use   Smoking status: Every Day    Packs/day: 0.50    Years: 43.00    Pack years:  21.50    Types: Cigarettes   Smokeless tobacco: Never  Vaping Use    Vaping Use: Never used  Substance Use Topics   Alcohol use: Not Currently    Alcohol/week: 12.0 standard drinks    Types: 12 Standard drinks or equivalent per week    Comment: occassional   Drug use: Not Currently    Types: Cocaine    Comment: hx of crack/cocaine 92yrs ago 10/01/2019- none    Family History  Problem Relation Age of Onset   Heart failure Father    Hypertension Father    Diabetes Brother    Heart attack Brother    Alzheimer's disease Mother    Stroke Sister    Diabetes Sister    Alzheimer's disease Sister    Hypertension Brother    Diabetes Brother    Drug abuse Brother    Colon cancer Neg Hx     Allergies  Allergen Reactions   Augmentin [Amoxicillin-Pot Clavulanate] Diarrhea and Other (See Comments)    Severe diarrhea   Mucinex Fast-Max Other (See Comments)    Intense sweating    Amphetamines Other (See Comments)    Unknown reaction    Health Maintenance  Topic Date Due   FOOT EXAM  12/30/2020   HEMOGLOBIN A1C  08/03/2021   LIPID PANEL  05/13/2022   COLONOSCOPY (Pts 45-16yrs Insurance coverage will need to be confirmed)  10/01/2022   OPHTHALMOLOGY EXAM  10/05/2022   TETANUS/TDAP  06/13/2029   Pneumonia Vaccine 54+ Years old  Completed   INFLUENZA VACCINE  Completed   COVID-19 Vaccine  Completed   Hepatitis C Screening  Completed   Zoster Vaccines- Shingrix  Completed   HPV VACCINES  Aged Out    Objective:  Vitals:   10/28/21 1100  BP: 114/63  Pulse: 72  Temp: 97.9 F (36.6 C)  TempSrc: Oral  SpO2: 98%  Weight: 174 lb (78.9 kg)   Body mass index is 24.27 kg/m.  Physical Exam Constitutional:      Comments: His spirits are good.  Cardiovascular:     Rate and Rhythm: Normal rate.  Pulmonary:     Effort: Pulmonary effort is normal.  Abdominal:     Palpations: Abdomen is soft.     Tenderness: There is no abdominal tenderness.  Psychiatric:        Mood and Affect: Mood normal.    Lab Results Lab Results  Component Value Date    WBC 4.0 10/07/2021   HGB 14.4 10/07/2021   HCT 45 10/07/2021   MCV 97.3 07/08/2021   PLT 150 10/07/2021    Lab Results  Component Value Date   CREATININE 9.3 (A) 10/07/2021   BUN 88 (A) 10/07/2021   NA 135 (A) 10/07/2021   K 5.0 10/07/2021   CL 94 (A) 10/07/2021   CO2 27 07/08/2021    Lab Results  Component Value Date   ALT 18 06/23/2021   AST 22 06/23/2021   ALKPHOS 60 06/23/2021   BILITOT 0.4 06/23/2021    Lab Results  Component Value Date   CHOL 111 05/13/2021   HDL 32 (L) 05/13/2021   LDLCALC 58 05/13/2021   TRIG 111 05/13/2021   CHOLHDL 3.5 05/13/2021   Lab Results  Component Value Date   LABRPR REACTIVE (A) 06/30/2020   RPRTITER 1:1 (H) 06/30/2020   HIV 1 RNA Quant  Date Value  04/06/2021 <20 Copies/mL (H)  06/30/2020 <20 Copies/mL  06/24/2019 <20 NOT DETECTED copies/mL   CD4 T Cell Abs (/  uL)  Date Value  04/06/2021 406  06/30/2020 403  06/24/2019 927     Problem List Items Addressed This Visit       High   Human immunodeficiency virus (HIV) disease ([place]) (Chronic)    His infection has been under excellent, long-term control.  He will continue [place] and follow-up after lab work in May.      Relevant Medications   BIKTARVY 50-200-25 MG TABS tablet   Helicobacter pylori (H. pylori) infection    I reviewed management options for his persistent Helicobacter pylori infection including continued observation off of antibiotics, a fourth round of high-dose antibiotics including agents he may not have been on before, and gastroenterology reevaluation for possible repeat endoscopy to have biopsy specimen sent for Helicobacter culture and susceptibility testing.  He quickly and decisively told me that he does not want any further antibiotic treatment at this time.  He says that it simply involves too many pills.  I asked him to call me if he starts to notice any problems suggesting recurrent GI bleeding.      Relevant Medications   BIKTARVY 50-200-25 MG  TABS tablet     Unprioritized   Symptomatic anemia    His hemoglobin was stable at 7.9 last month.  He is currently asymptomatic.  His blood work is being monitored frequently during hemodialysis.         [name], MD [facility] [phone] pager   [phone] cell 10/28/2021, 11:36 AM

## 2021-10-28 NOTE — Assessment & Plan Note (Signed)
His hemoglobin was stable at 7.9 last month.  He is currently asymptomatic.  His blood work is being monitored frequently during hemodialysis.

## 2021-10-28 NOTE — Assessment & Plan Note (Signed)
I reviewed management options for his persistent Helicobacter pylori infection including continued observation off of antibiotics, a fourth round of high-dose antibiotics including agents he may not have been on before, and gastroenterology reevaluation for possible repeat endoscopy to have biopsy specimen sent for Helicobacter culture and susceptibility testing.  He quickly and decisively told me that he does not want any further antibiotic treatment at this time.  He says that it simply involves too many pills.  I asked him to call me if he starts to notice any problems suggesting recurrent GI bleeding.

## 2021-10-28 NOTE — Assessment & Plan Note (Signed)
His infection has been under excellent, long-term control.  He will continue [place] and follow-up after lab work in May.

## 2021-10-29 DIAGNOSIS — E1122 Type 2 diabetes mellitus with diabetic chronic kidney disease: Secondary | ICD-10-CM | POA: Diagnosis not present

## 2021-10-29 DIAGNOSIS — N2581 Secondary hyperparathyroidism of renal origin: Secondary | ICD-10-CM | POA: Diagnosis not present

## 2021-10-29 DIAGNOSIS — Z992 Dependence on renal dialysis: Secondary | ICD-10-CM | POA: Diagnosis not present

## 2021-10-29 DIAGNOSIS — D631 Anemia in chronic kidney disease: Secondary | ICD-10-CM | POA: Diagnosis not present

## 2021-10-29 DIAGNOSIS — N186 End stage renal disease: Secondary | ICD-10-CM | POA: Diagnosis not present

## 2021-11-01 DIAGNOSIS — N186 End stage renal disease: Secondary | ICD-10-CM | POA: Diagnosis not present

## 2021-11-01 DIAGNOSIS — N2581 Secondary hyperparathyroidism of renal origin: Secondary | ICD-10-CM | POA: Diagnosis not present

## 2021-11-01 DIAGNOSIS — Z992 Dependence on renal dialysis: Secondary | ICD-10-CM | POA: Diagnosis not present

## 2021-11-01 DIAGNOSIS — D631 Anemia in chronic kidney disease: Secondary | ICD-10-CM | POA: Diagnosis not present

## 2021-11-01 DIAGNOSIS — E1122 Type 2 diabetes mellitus with diabetic chronic kidney disease: Secondary | ICD-10-CM | POA: Diagnosis not present

## 2021-11-03 DIAGNOSIS — N2581 Secondary hyperparathyroidism of renal origin: Secondary | ICD-10-CM | POA: Diagnosis not present

## 2021-11-03 DIAGNOSIS — Z992 Dependence on renal dialysis: Secondary | ICD-10-CM | POA: Diagnosis not present

## 2021-11-03 DIAGNOSIS — D631 Anemia in chronic kidney disease: Secondary | ICD-10-CM | POA: Diagnosis not present

## 2021-11-03 DIAGNOSIS — E1122 Type 2 diabetes mellitus with diabetic chronic kidney disease: Secondary | ICD-10-CM | POA: Diagnosis not present

## 2021-11-03 DIAGNOSIS — N186 End stage renal disease: Secondary | ICD-10-CM | POA: Diagnosis not present

## 2021-11-03 LAB — HEMOGLOBIN A1C: Hemoglobin A1C: 6.9

## 2021-11-03 LAB — BASIC METABOLIC PANEL
BUN: 87 — AB (ref 4–21)
Chloride: 94 — AB (ref 99–108)
Creatinine: 9.6 — AB (ref 0.6–1.3)
Glucose: 152
Potassium: 5.1 (ref 3.4–5.3)
Sodium: 135 — AB (ref 137–147)

## 2021-11-03 LAB — CBC AND DIFFERENTIAL
HCT: 37 — AB (ref 41–53)
Hemoglobin: 12.4 — AB (ref 13.5–17.5)
Platelets: 129 — AB (ref 150–399)
WBC: 5.3

## 2021-11-03 LAB — CBC: RBC: 3.82 — AB (ref 3.87–5.11)

## 2021-11-03 LAB — IRON,TIBC AND FERRITIN PANEL
Ferritin: 113
Iron: 88
TIBC: 323
UIBC: 235

## 2021-11-03 LAB — HEPATIC FUNCTION PANEL: Alkaline Phosphatase: 84 (ref 25–125)

## 2021-11-03 LAB — COMPREHENSIVE METABOLIC PANEL: Calcium: 9.4 (ref 8.7–10.7)

## 2021-11-04 ENCOUNTER — Other Ambulatory Visit: Payer: Self-pay

## 2021-11-04 ENCOUNTER — Ambulatory Visit (HOSPITAL_COMMUNITY)
Admission: RE | Admit: 2021-11-04 | Discharge: 2021-11-04 | Disposition: A | Discharge status: Home / Self Care | Payer: Commercial Managed Care - HMO | Source: Ambulatory Visit | Attending: Gastroenterology | Admitting: Gastroenterology

## 2021-11-04 DIAGNOSIS — Q2730 Arteriovenous malformation, site unspecified: Secondary | ICD-10-CM | POA: Diagnosis not present

## 2021-11-04 MED ORDER — OCTREOTIDE ACETATE 10 MG IM KIT
10.0000 mg | PACK | INTRAMUSCULAR | Status: DC
  Administered 2021-11-04: 10 mg via INTRAMUSCULAR
  Filled 2021-11-04: qty 1

## 2021-11-05 DIAGNOSIS — Z992 Dependence on renal dialysis: Secondary | ICD-10-CM | POA: Diagnosis not present

## 2021-11-05 DIAGNOSIS — E1122 Type 2 diabetes mellitus with diabetic chronic kidney disease: Secondary | ICD-10-CM | POA: Diagnosis not present

## 2021-11-05 DIAGNOSIS — N2581 Secondary hyperparathyroidism of renal origin: Secondary | ICD-10-CM | POA: Diagnosis not present

## 2021-11-05 DIAGNOSIS — N186 End stage renal disease: Secondary | ICD-10-CM | POA: Diagnosis not present

## 2021-11-05 DIAGNOSIS — D631 Anemia in chronic kidney disease: Secondary | ICD-10-CM | POA: Diagnosis not present

## 2021-11-08 DIAGNOSIS — N2581 Secondary hyperparathyroidism of renal origin: Secondary | ICD-10-CM | POA: Diagnosis not present

## 2021-11-08 DIAGNOSIS — N186 End stage renal disease: Secondary | ICD-10-CM | POA: Diagnosis not present

## 2021-11-08 DIAGNOSIS — D631 Anemia in chronic kidney disease: Secondary | ICD-10-CM | POA: Diagnosis not present

## 2021-11-08 DIAGNOSIS — E1122 Type 2 diabetes mellitus with diabetic chronic kidney disease: Secondary | ICD-10-CM | POA: Diagnosis not present

## 2021-11-08 DIAGNOSIS — Z992 Dependence on renal dialysis: Secondary | ICD-10-CM | POA: Diagnosis not present

## 2021-11-09 ENCOUNTER — Other Ambulatory Visit: Payer: Self-pay | Admitting: Nurse Practitioner

## 2021-11-09 DIAGNOSIS — R69 Illness, unspecified: Secondary | ICD-10-CM | POA: Diagnosis not present

## 2021-11-09 DIAGNOSIS — I871 Compression of vein: Secondary | ICD-10-CM | POA: Diagnosis not present

## 2021-11-09 DIAGNOSIS — Z992 Dependence on renal dialysis: Secondary | ICD-10-CM | POA: Diagnosis not present

## 2021-11-09 DIAGNOSIS — N186 End stage renal disease: Secondary | ICD-10-CM | POA: Diagnosis not present

## 2021-11-09 DIAGNOSIS — R6889 Other general symptoms and signs: Secondary | ICD-10-CM | POA: Diagnosis not present

## 2021-11-10 ENCOUNTER — Other Ambulatory Visit: Payer: Self-pay | Admitting: *Deleted

## 2021-11-10 DIAGNOSIS — E1122 Type 2 diabetes mellitus with diabetic chronic kidney disease: Secondary | ICD-10-CM | POA: Diagnosis not present

## 2021-11-10 DIAGNOSIS — M48061 Spinal stenosis, lumbar region without neurogenic claudication: Secondary | ICD-10-CM

## 2021-11-10 DIAGNOSIS — N186 End stage renal disease: Secondary | ICD-10-CM | POA: Diagnosis not present

## 2021-11-10 DIAGNOSIS — D631 Anemia in chronic kidney disease: Secondary | ICD-10-CM | POA: Diagnosis not present

## 2021-11-10 DIAGNOSIS — Z992 Dependence on renal dialysis: Secondary | ICD-10-CM | POA: Diagnosis not present

## 2021-11-10 DIAGNOSIS — N2581 Secondary hyperparathyroidism of renal origin: Secondary | ICD-10-CM | POA: Diagnosis not present

## 2021-11-10 MED ORDER — OXYCODONE-ACETAMINOPHEN 5-325 MG PO TABS: 1.0000 | ORAL_TABLET | ORAL | 0 refills | Status: DC | PRN

## 2021-11-10 MED FILL — Octreotide Acetate For IM Inj Kit 10 MG: INTRAMUSCULAR | Qty: 1 | Status: AC

## 2021-11-10 NOTE — Telephone Encounter (Signed)
Patient called requesting refill.  Epic LR: 10/14/2021 #30 Contract date: 07/20/2021 Pended Rx and sent to Dr. Sabra Heck for approval.

## 2021-11-11 ENCOUNTER — Encounter (HOSPITAL_COMMUNITY): Payer: Self-pay

## 2021-11-12 DIAGNOSIS — E1122 Type 2 diabetes mellitus with diabetic chronic kidney disease: Secondary | ICD-10-CM | POA: Diagnosis not present

## 2021-11-12 DIAGNOSIS — D631 Anemia in chronic kidney disease: Secondary | ICD-10-CM | POA: Diagnosis not present

## 2021-11-12 DIAGNOSIS — Z992 Dependence on renal dialysis: Secondary | ICD-10-CM | POA: Diagnosis not present

## 2021-11-12 DIAGNOSIS — N2581 Secondary hyperparathyroidism of renal origin: Secondary | ICD-10-CM | POA: Diagnosis not present

## 2021-11-12 DIAGNOSIS — N186 End stage renal disease: Secondary | ICD-10-CM | POA: Diagnosis not present

## 2021-11-15 DIAGNOSIS — N2581 Secondary hyperparathyroidism of renal origin: Secondary | ICD-10-CM | POA: Diagnosis not present

## 2021-11-15 DIAGNOSIS — Z992 Dependence on renal dialysis: Secondary | ICD-10-CM | POA: Diagnosis not present

## 2021-11-15 DIAGNOSIS — E1122 Type 2 diabetes mellitus with diabetic chronic kidney disease: Secondary | ICD-10-CM | POA: Diagnosis not present

## 2021-11-15 DIAGNOSIS — D631 Anemia in chronic kidney disease: Secondary | ICD-10-CM | POA: Diagnosis not present

## 2021-11-15 DIAGNOSIS — N186 End stage renal disease: Secondary | ICD-10-CM | POA: Diagnosis not present

## 2021-11-16 ENCOUNTER — Other Ambulatory Visit: Payer: Medicare Other

## 2021-11-16 ENCOUNTER — Ambulatory Visit (INDEPENDENT_AMBULATORY_CARE_PROVIDER_SITE_OTHER): Payer: Medicare Other

## 2021-11-16 ENCOUNTER — Other Ambulatory Visit: Payer: Self-pay

## 2021-11-16 ENCOUNTER — Other Ambulatory Visit: Payer: Self-pay | Admitting: Podiatry

## 2021-11-16 ENCOUNTER — Ambulatory Visit (INDEPENDENT_AMBULATORY_CARE_PROVIDER_SITE_OTHER): Payer: Medicare Other | Admitting: Podiatry

## 2021-11-16 ENCOUNTER — Ambulatory Visit: Payer: Medicare Other | Admitting: Podiatry

## 2021-11-16 DIAGNOSIS — E119 Type 2 diabetes mellitus without complications: Secondary | ICD-10-CM

## 2021-11-16 DIAGNOSIS — L97412 Non-pressure chronic ulcer of right heel and midfoot with fat layer exposed: Secondary | ICD-10-CM | POA: Diagnosis not present

## 2021-11-16 DIAGNOSIS — L84 Corns and callosities: Secondary | ICD-10-CM | POA: Diagnosis not present

## 2021-11-16 DIAGNOSIS — E08621 Diabetes mellitus due to underlying condition with foot ulcer: Secondary | ICD-10-CM

## 2021-11-16 DIAGNOSIS — E11621 Type 2 diabetes mellitus with foot ulcer: Secondary | ICD-10-CM | POA: Diagnosis not present

## 2021-11-16 DIAGNOSIS — E0842 Diabetes mellitus due to underlying condition with diabetic polyneuropathy: Secondary | ICD-10-CM

## 2021-11-16 DIAGNOSIS — B351 Tinea unguium: Secondary | ICD-10-CM | POA: Diagnosis not present

## 2021-11-16 DIAGNOSIS — L97511 Non-pressure chronic ulcer of other part of right foot limited to breakdown of skin: Secondary | ICD-10-CM | POA: Diagnosis not present

## 2021-11-16 DIAGNOSIS — M2042 Other hammer toe(s) (acquired), left foot: Secondary | ICD-10-CM

## 2021-11-16 DIAGNOSIS — M2041 Other hammer toe(s) (acquired), right foot: Secondary | ICD-10-CM | POA: Diagnosis not present

## 2021-11-16 MED ORDER — DOXYCYCLINE HYCLATE 100 MG PO CAPS: 100.0000 mg | ORAL_CAPSULE | Freq: Two times a day (BID) | ORAL | 0 refills | Status: AC

## 2021-11-16 NOTE — Patient Instructions (Signed)
Follow up with Dr. Sherryle Lis in one week for right foot ulcer.  DRESSING CHANGES right foot; perform once daily every other day:   PHARMACY SHOPPING LIST: Saline or Wound Cleanser for cleaning wound 2 x 2 inch sterile gauze for cleaning wound Iodosorb Gel  A. IF DISPENSED, WEAR SURGICAL SHOE OR WALKING BOOT AT ALL TIMES.  B. IF PRESCRIBED ORAL ANTIBIOTICS, TAKE ALL MEDICATION AS PRESCRIBED UNTIL ALL ARE GONE.  C. IF DOCTOR HAS DESIGNATED NONWEIGHTBEARING STATUS, PLEASE ADHERE TO INSTRUCTIONS.  1. KEEP right foot DRY AT ALL TIMES!!!!  2. CLEANSE ULCER WITH SALINE OR WOUND CLEANSER.  3. DAB DRY WITH GAUZE SPONGE.  4. APPLY A LIGHT AMOUNT OF Iodosorb Gel TO BASE OF ULCER.  5. APPLY OUTER DRESSING AS INSTRUCTED.  6. WEAR SURGICAL SHOE/BOOT DAILY AT ALL TIMES. IF SUPPLIED, WEAR HEEL PROTECTORS AT ALL TIMES WHEN IN BED.  7. DO NOT WALK BAREFOOT!!!  8.  IF YOU EXPERIENCE ANY FEVER, CHILLS, NIGHTSWEATS, NAUSEA OR VOMITING, ELEVATED OR LOW BLOOD SUGARS, REPORT TO EMERGENCY ROOM.  9. IF YOU EXPERIENCE INCREASED REDNESS, PAIN, SWELLING, DISCOLORATION, ODOR, PUS, DRAINAGE OR WARMTH OF YOUR FOOT, REPORT TO EMERGENCY ROOM.

## 2021-11-16 NOTE — Progress Notes (Signed)
ANNUAL DIABETIC FOOT EXAM  Subjective: Jacob [name] presents today for at risk foot care with history of diabetic neuropathy, painful thick toenails that are difficult to trim. Pain interferes with ambulation. Aggravating factors include wearing enclosed shoe gear. Pain is relieved with periodic professional debridement., and preulcerative lesion(s) right foot..  Patient relates several year h/o diabetes.  Patient has h/o foot ulcer of both feet, which healed via help of local wound care.  He is on dialysis and dialysis days are MWF.  Patient has been diagnosed with neuropathy and it is managed with gapapentin.  Patient's blood sugar was 180 mg/dl this morning.   Risk factors: diabetic neuropathy, foot deformity, ESRD on hemodialysis, hyperlipidemia, history of ulcer.  Jacob Honour, MD is patient's PCP. Last visit was 10/19/2021  Patient relates right foot is painful where his preulcerative callus is located. He denies any redness, drainage or swelling. Denies any fever, chills, nightsweats, nausea or vomiting.  Past Medical History:  Diagnosis Date   Acute respiratory failure ([place]) 03/01/2018   Anemia    Arthritis    "all over; mostly knees and back" (02/28/2018)   Chronic combined systolic and diastolic CHF (congestive heart failure) (HCC)    Chronic lower back pain    stenosis   Community acquired pneumonia 09/06/2013   COPD (chronic obstructive pulmonary disease) ([place])    Coronary atherosclerosis of native coronary artery    a. 02/2003 s/p CABG x 2 (VG->RI, VG->RPDA; b. 11/2019 PCI: LM nl, LAD 90d, D3 50, RI 100, LCX 100p, OM3 100 - fills via L->L collats from D2/dLAD, RCA 100p, VG->RPDA ok, VG->RI 95 (3.5x48 Synergy XD DES).   Drug abuse ([place])    hx; tested for cocaine as recently as 2/08. says he is not using drugs now - avoided defib. for this reason    ESRD (end stage renal disease) ([place])    Hemo M-W-F- [facility]   Fall at home 10/2020   GERD (gastroesophageal reflux  disease)    takes OTC meds as needed   GI bleeding    a. 11/2019 EGD: angiodysplastic lesions w/ bleeding s/p argon plasma/clipping/epi inj. Multiple admissions for the same.   Glaucoma    uses eye drops daily   Hepatitis B 1968   "tx'd w/isolation; caught it from toilet stools in gym"   History of blood transfusion 03/01/2019   History of colon polyps    benign   History of gout    takes Allopurinol daily as well as Colchicine-if needed (02/28/2018)   History of kidney stones    HTN (hypertension)    takes Coreg,Imdur.and Apresoline daily   Human immunodeficiency virus (HIV) disease ([place]) dx'd 1995   on [place] as of 12/2020.     Hyperlipidemia    Ischemic cardiomyopathy    a. 01/2019 Echo: EF 40-45%, diffuse HK, mild basal septal hypertrophy. Diast dysfxn. Nl RV size/fxn. Sev dil LA. Triv MR/TR/PR.   Muscle spasm    takes Zanaflex as needed   Myocardial infarction ([place]) ~ 2004/2005   Nocturia    Peripheral neuropathy    takes gabapentin daily   Pneumonia    "at least twice" (02/28/2018)   SDH (subdural hematoma)    Syphilis, unspecified    Type II diabetes mellitus ([place]) 2004   Lantus daily.Average fasting blood sugar 125-199   Wears glasses    Wears partial dentures    Patient Active Problem List   Diagnosis Date Noted   Chronic lower back pain  Thrombocytopenia ([place])    Hypercalcemia 29/51/8841   Helicobacter pylori (H. pylori) infection 04/06/2021   Lumbar foraminal stenosis 03/30/2021   Gastric hemorrhage due to angiodysplasia of stomach    GI bleed 11/24/2020   COVID-19 virus infection 11/24/2020   Other mechanical complication of cranial or spinal infusion catheter, sequela 11/11/2020   Subdural hematoma 11/06/2020   Fall at home, initial encounter 11/05/2020   Nicotine dependence, cigarettes, uncomplicated 66/03/3015   Alcohol abuse 11/05/2020   Unspecified trochanteric fracture of left femur, initial encounter for closed fracture ([place]) 11/05/2020   Traumatic  subdural hematoma, initial encounter 11/05/2020   Fracture of metatarsal of right foot, closed 11/05/2020   Nondisplaced fracture of greater trochanter of left femur, initial encounter for closed fracture ([place])    Uremia    Angiodysplasia of intestine with hemorrhage    Pruritus, unspecified 10/02/2020   Iron deficiency anemia    Acute blood loss anemia    Acute on chronic anemia    Abnormal nuclear stress test 12/19/2019   Type 2 diabetes mellitus with diabetic neuropathy, unspecified ([place]) 08/07/2019   Allergy, unspecified, initial encounter 11/01/3233   Complication of vascular dialysis catheter 08/02/2019   Drug abuse counseling and surveillance of drug abuser 08/02/2019   Other specified coagulation defects ([place]) 08/02/2019   Secondary hyperparathyroidism of renal origin ([place]) 08/02/2019   Unspecified atrial fibrillation ([place]) 08/02/2019   ESRD on hemodialysis ([place])    Cocaine abuse ([place])    CHF exacerbation ([place]) 07/21/2019   Diabetic foot ulcer ([place]) 02/11/2019   AVM (arteriovenous malformation)    Melena 02/07/2019   Symptomatic anemia 02/06/2019   Hyperlipidemia associated with type 2 diabetes mellitus ([place]) 05/30/2018   Right foot ulcer ([place]) 02/28/2018   Chronic obstructive pulmonary disease ([place]) 02/28/2018   Anemia of chronic disease 11/20/2016   CHF (congestive heart failure) ([place]) 11/10/2016   History of lumbar laminectomy for spinal cord decompression 02/29/2016   Type 2 diabetes mellitus with diabetic polyneuropathy, with long-term current use of insulin ([place]) 06/17/2015   HTN (hypertension) 04/26/2015   DM (diabetes mellitus) ([place]) 04/26/2015   Ischemic cardiomyopathy 05/12/2014   Bunion of left foot 11/25/2011   Bunion, right foot 11/25/2011   Polysubstance abuse ([place]) 07/28/2011   Hip fracture, left ([place]) 04/04/2011   Closed fracture of neck of femur ([place]) 04/04/2011   Insomnia 02/18/2011   Coronary artery disease involving native heart without angina  pectoris 04/20/2009   Chronic combined systolic (congestive) and diastolic (congestive) heart failure ([place]) 04/20/2009   Mixed hyperlipidemia 11/20/2006   Gout 11/20/2006   TOBACCO ABUSE 11/20/2006   Essential hypertension 11/20/2006   Human immunodeficiency virus (HIV) disease ([place]) 09/02/2006   Past Surgical History:  Procedure Laterality Date   AV FISTULA PLACEMENT Left 08/02/2018   Procedure: ARTERIOVENOUS (AV) FISTULA CREATION  left arm radiocephlic;  Surgeon: Marty Heck, MD;  Location: [facility] OR;  Service: Vascular;  Laterality: Left;   AV FISTULA PLACEMENT Left 08/01/2019   Procedure: LEFT BRACHIOCEPHALIC ARTERIOVENOUS (AV) FISTULA CREATION;  Surgeon: [name], MD;  Location: [place];  Service: Vascular;  Laterality: Left;   BASCILIC VEIN TRANSPOSITION Left 10/03/2019   Procedure: BASILIC VEIN TRANSPOSITION LEFT SECOND STAGE;  Surgeon: [name], MD;  Location: [place];  Service: Vascular;  Laterality: Left;   BIOPSY  01/25/2021   Procedure: BIOPSY;  Surgeon: Doran Stabler, MD;  Location[facility] ENDOSCOPY;  Service: Gastroenterology;;   CARDIAC CATHETERIZATION  10/2002; 12/19/2004   [name] 03/08/2011  COLONOSCOPY  2013   [place]    CORONARY ARTERY BYPASS GRAFT  02/24/2003   CABG X2/notes 03/08/2011   CORONARY STENT INTERVENTION N/A 12/19/2019   Procedure: CORONARY STENT INTERVENTION;  Surgeon: Jettie Booze, MD;  Location: [place] CV LAB;  Service: Cardiovascular;  Laterality: N/A;   ENTEROSCOPY N/A 01/25/2021   Procedure: ENTEROSCOPY;  Surgeon: Doran Stabler, MD;  Location: [place];  Service: Gastroenterology;  Laterality: N/A;   ENTEROSCOPY N/A 02/13/2021   Procedure: ENTEROSCOPY;  Surgeon: Jackquline Denmark, MD;  Location: [facility] ENDOSCOPY;  Service: Endoscopy;  Laterality: N/A;   ENTEROSCOPY N/A 05/07/2021   Procedure: ENTEROSCOPY;  Surgeon: Yetta Flock, MD;  Location: [facility] ENDOSCOPY;  Service: Gastroenterology;  Laterality: N/A;    ESOPHAGOGASTRODUODENOSCOPY (EGD) WITH PROPOFOL N/A 02/08/2019   Procedure: ESOPHAGOGASTRODUODENOSCOPY (EGD) WITH PROPOFOL;  Surgeon: Milus Banister, MD;  Location: [place];  Service: Gastroenterology;  Laterality: N/A;   ESOPHAGOGASTRODUODENOSCOPY (EGD) WITH PROPOFOL N/A 12/22/2019   Procedure: ESOPHAGOGASTRODUODENOSCOPY (EGD) WITH PROPOFOL;  Surgeon: Lavena Bullion, DO;  Location: [place];  Service: Gastroenterology;  Laterality: N/A;   ESOPHAGOGASTRODUODENOSCOPY (EGD) WITH PROPOFOL N/A 10/19/2020   Procedure: ESOPHAGOGASTRODUODENOSCOPY (EGD) WITH PROPOFOL;  Surgeon: Jackquline Denmark, MD;  Location: [facility] ENDOSCOPY;  Service: Endoscopy;  Laterality: N/A;   ESOPHAGOGASTRODUODENOSCOPY (EGD) WITH PROPOFOL N/A 12/22/2020   Procedure: ESOPHAGOGASTRODUODENOSCOPY (EGD) WITH PROPOFOL;  Surgeon: Gatha Mayer, MD;  Location: [place];  Service: Endoscopy;  Laterality: N/A;   ESOPHAGOGASTRODUODENOSCOPY (EGD) WITH PROPOFOL N/A 01/09/2021   Procedure: ESOPHAGOGASTRODUODENOSCOPY (EGD) WITH PROPOFOL;  Surgeon: Irene Shipper, MD;  Location: [facility] ENDOSCOPY;  Service: Endoscopy;  Laterality: N/A;   HEMOSTASIS CLIP PLACEMENT  12/22/2019   Procedure: HEMOSTASIS CLIP PLACEMENT;  Surgeon: Lavena Bullion, DO;  Location: [place] ENDOSCOPY;  Service: Gastroenterology;;   HEMOSTASIS CLIP PLACEMENT  12/22/2020   Procedure: HEMOSTASIS CLIP PLACEMENT;  Surgeon: Gatha Mayer, MD;  Location: [facility] ENDOSCOPY;  Service: Endoscopy;;   HEMOSTASIS CONTROL  12/22/2020   Procedure: HEMOSTASIS CONTROL/hemospray;  Surgeon: Gatha Mayer, MD;  Location: [place];  Service: Endoscopy;;   HOT HEMOSTASIS N/A 02/08/2019   Procedure: HOT HEMOSTASIS (ARGON PLASMA COAGULATION/BICAP);  Surgeon: Milus Banister, MD;  Location: [facility] ENDOSCOPY;  Service: Gastroenterology;  Laterality: N/A;   HOT HEMOSTASIS N/A 12/22/2019   Procedure: HOT HEMOSTASIS (ARGON PLASMA COAGULATION/BICAP);  Surgeon: Lavena Bullion, DO;  Location: [facility] ENDOSCOPY;   Service: Gastroenterology;  Laterality: N/A;   HOT HEMOSTASIS N/A 10/19/2020   Procedure: HOT HEMOSTASIS (ARGON PLASMA COAGULATION/BICAP);  Surgeon: Jackquline Denmark, MD;  Location: [facility] ENDOSCOPY;  Service: Endoscopy;  Laterality: N/A;   HOT HEMOSTASIS N/A 12/22/2020   Procedure: HOT HEMOSTASIS (ARGON PLASMA COAGULATION/BICAP);  Surgeon: Gatha Mayer, MD;  Location: [facility] ENDOSCOPY;  Service: Endoscopy;  Laterality: N/A;   HOT HEMOSTASIS N/A 01/09/2021   Procedure: HOT HEMOSTASIS (ARGON PLASMA COAGULATION/BICAP);  Surgeon: Irene Shipper, MD;  Location: [facility] ENDOSCOPY;  Service: Endoscopy;  Laterality: N/A;   HOT HEMOSTASIS N/A 01/25/2021   Procedure: HOT HEMOSTASIS (ARGON PLASMA COAGULATION/BICAP);  Surgeon: Doran Stabler, MD;  Location: [place];  Service: Gastroenterology;  Laterality: N/A;   HOT HEMOSTASIS N/A 02/13/2021   Procedure: HOT HEMOSTASIS (ARGON PLASMA COAGULATION/BICAP);  Surgeon: Jackquline Denmark, MD;  Location: [facility] ENDOSCOPY;  Service: Endoscopy;  Laterality: N/A;   HOT HEMOSTASIS N/A 05/07/2021   Procedure: HOT HEMOSTASIS (ARGON PLASMA COAGULATION/BICAP);  Surgeon: Yetta Flock, MD;  Location: [facility] ENDOSCOPY;  Service: Gastroenterology;  Laterality: N/A;  INTERTROCHANTERIC HIP FRACTURE SURGERY Left 11/2006   [name] 03/08/2011   INTRAVASCULAR ULTRASOUND/IVUS N/A 12/19/2019   Procedure: Intravascular Ultrasound/IVUS;  Surgeon: Jettie Booze, MD;  Location: [place] CV LAB;  Service: Cardiovascular;  Laterality: N/A;   IR FLUORO GUIDE CV LINE RIGHT  07/24/2019   IR FLUORO GUIDE CV LINE RIGHT  07/30/2019   IR US GUIDE VASC ACCESS RIGHT  07/24/2019   IR US GUIDE VASC ACCESS RIGHT  07/30/2019   LAPAROSCOPIC CHOLECYSTECTOMY  05/2006   LIGATION OF COMPETING BRANCHES OF ARTERIOVENOUS FISTULA Left 11/05/2018   Procedure: LIGATION OF COMPETING BRANCHES OF ARTERIOVENOUS FISTULA  LEFT  ARM;  Surgeon: Marty Heck, MD;  Location: [facility] OR;  Service: Vascular;  Laterality: Left;    LUMBAR LAMINECTOMY/DECOMPRESSION MICRODISCECTOMY N/A 02/29/2016   Procedure: Left L4-5 Lateral Recess Decompression, Removal Extradural Intraspinal Facet Cyst;  Surgeon: Marybelle Killings, MD;  Location: [place];  Service: Orthopedics;  Laterality: N/A;   MULTIPLE TOOTH EXTRACTIONS     ORIF MANDIBULAR FRACTURE Left 08/13/2004   ORIF of left body fracture mandible with KLS Martin 2.3-mm six hole/notes 03/08/2011   RIGHT/LEFT HEART CATH AND CORONARY/GRAFT ANGIOGRAPHY N/A 12/19/2019   Procedure: RIGHT/LEFT HEART CATH AND CORONARY/GRAFT ANGIOGRAPHY;  Surgeon: Jettie Booze, MD;  Location: [place] CV LAB;  Service: Cardiovascular;  Laterality: N/A;   SCLEROTHERAPY  12/22/2019   Procedure: SCLEROTHERAPY;  Surgeon: Lavena Bullion, DO;  Location: [place];  Service: Gastroenterology;;   Clide Deutscher  02/13/2021   Procedure: Clide Deutscher;  Surgeon: Jackquline Denmark, MD;  Location: [facility] ENDOSCOPY;  Service: Endoscopy;;   Current Outpatient Medications on File Prior to Visit  Medication Sig Dispense Refill   acetaminophen (TYLENOL) 650 MG CR tablet Take 1,300 mg by mouth 3 (three) times daily.     allopurinol (ZYLOPRIM) 100 MG tablet TAKE 1 TABLET(100 MG) BY MOUTH DAILY 90 tablet 2   ANORO ELLIPTA 62.5-25 MCG/INH AEPB INHALE 1 PUFF BY MOUTH EVERY DAY 60 each 5   atorvastatin (LIPITOR) 10 MG tablet Take 1 tablet (10 mg total) by mouth daily. 90 tablet 3   B-D UF III MINI PEN NEEDLES 31G X 5 MM MISC USE FOUR TIMES DAILY 100 each 11   BIKTARVY 50-200-25 MG TABS tablet Take 1 tablet by mouth daily. 30 tablet 11   Calcium Acetate 667 MG TABS Take 667-1,334 mg by mouth See admin instructions. Take 1,334 mg by mouth three times a day with meals and 667 mg with each snack     carvedilol (COREG) 3.125 MG tablet TAKE 1 TABLET(3.125 MG) BY MOUTH TWICE DAILY 180 tablet 3   colchicine 0.6 MG tablet TAKE 1 TABLET BY MOUTH AS NEEDED FOR GOUT 30 tablet 1   diclofenac Sodium (VOLTAREN) 1 % GEL APPLY 2 GRAMS TOPICALLY  TO THE AFFECTED AREA THREE TIMES DAILY AS NEEDED FOR PAIN 528 g 3   folic acid (FOLVITE) 1 MG tablet TAKE 1 TABLET(1 MG) BY MOUTH DAILY 90 tablet 2   gabapentin (NEURONTIN) 300 MG capsule TAKE 1 CAPSULE BY MOUTH DAILY. DOSE CAHNGE DUE TO RENAL FUNCTION 90 capsule 2   glucose blood (ONETOUCH ULTRA) test strip Check blood sugar three times daily E11.40 300 strip 3   insulin lispro (HUMALOG) 100 UNIT/ML KwikPen INJECT 13 UNITS UNDER THE SKIN THREE TIMES DAILY AS NEEDED FOR HIGH BLOOD SUGAR(ABOVE 150) 45 mL 1   isosorbide mononitrate (IMDUR) 30 MG 24 hr tablet TAKE 1 TABLET BY MOUTH EVERY DAY 90 tablet 2   Lancets (  ONETOUCH DELICA PLUS [id number]) MISC 1 Device by Other route 3 (three) times daily. E11.42 300 each 1   latanoprost (XALATAN) 0.005 % ophthalmic solution Place 1 drop into both eyes at bedtime.     lidocaine-prilocaine (EMLA) cream Apply 1 application topically every Monday, Wednesday, and Friday with hemodialysis.     Methoxy PEG-Epoetin Beta (MIRCERA IJ) Mircera     multivitamin (RENA-VIT) TABS tablet Take 1 tablet by mouth daily.     nitroGLYCERIN (NITROSTAT) 0.3 MG SL tablet PLACE 1 TABLET UNDER THE TONGUE AS NEEDED FOR CHEST PAIN 75 tablet 2   octreotide (SANDOSTATIN LAR) 10 MG injection Inject 10 mg into the muscle every 28 (twenty-eight) days. 1 kit 3   ondansetron (ZOFRAN) 4 MG tablet Take 1 tablet (4 mg total) by mouth every 8 (eight) hours as needed for nausea or vomiting. 20 tablet 0   oxyCODONE-acetaminophen (PERCOCET/ROXICET) 5-325 MG tablet Take 1 tablet by mouth every 4 (four) hours as needed for severe pain. 30 tablet 0   pantoprazole (PROTONIX) 40 MG tablet Take 1 tablet (40 mg total) by mouth 2 (two) times daily before a meal. 60 tablet 2   polyethylene glycol (MIRALAX / GLYCOLAX) 17 g packet Take 17 g by mouth daily as needed for mild constipation. 14 each 0   timolol (TIMOPTIC) 0.5 % ophthalmic solution Place 1 drop into both eyes in the morning.     VITAMIN D PO Take by  mouth.     No current facility-administered medications on file prior to visit.    Allergies  Allergen Reactions   Augmentin [Amoxicillin-Pot Clavulanate] Diarrhea and Other (See Comments)    Severe diarrhea   Mucinex Fast-Max Other (See Comments)    Intense sweating    Amphetamines Other (See Comments)    Unknown reaction   Social History   Occupational History   Occupation: retired  Tobacco Use   Smoking status: Every Day    Packs/day: 0.50    Years: 43.00    Pack years: 21.50    Types: Cigarettes   Smokeless tobacco: Never  Vaping Use   Vaping Use: Never used  Substance and Sexual Activity   Alcohol use: Not Currently    Alcohol/week: 12.0 standard drinks    Types: 12 Standard drinks or equivalent per week    Comment: occassional   Drug use: Not Currently    Types: Cocaine    Comment: hx of crack/cocaine 72yr ago 10/01/2019- none   Sexual activity: Yes    Partners: Male    Comment: Declined condoms   Family History  Problem Relation Age of Onset   Heart failure Father    Hypertension Father    Diabetes Brother    Heart attack Brother    Alzheimer's disease Mother    Stroke Sister    Diabetes Sister    Alzheimer's disease Sister    Hypertension Brother    Diabetes Brother    Drug abuse Brother    Colon cancer Neg Hx    Immunization History  Administered Date(s) Administered   Fluad Quad(high Dose 65+) 06/14/2019, 07/20/2021   Hepatitis B 08/28/2006, 10/02/2007, 04/01/2008   Hepatitis B, adult 06/03/2014, 07/04/2014   Influenza Split 07/28/2011   Influenza Whole 08/28/2006, 09/10/2007, 09/15/2008, 08/03/2009, 07/26/2010   Influenza, High Dose Seasonal PF 07/04/2018   Influenza,inj,Quad PF,6+ Mos 07/04/2014, 07/06/2015, 07/12/2016, 07/11/2017   Influenza-Unspecified 06/24/2013, 06/30/2020   PFIZER(Purple Top)SARS-COV-2 Vaccination 11/16/2019, 12/07/2019, 07/23/2020   Pfizer Covid-19 Vaccine Bivalent Booster 182yr& up 07/15/2021  Pneumococcal  Conjugate-13 08/18/2016   Pneumococcal Polysaccharide-23 08/28/2006, 07/28/2011, 05/30/2018   Tdap 06/14/2019   Zoster Recombinat (Shingrix) 07/24/2017, 01/09/2018   Zoster, Live 06/03/2014     Review of Systems: Negative except as noted in the HPI.   Objective: There were no vitals filed for this visit.  Jacob [name] is a pleasant 71 y.o. male in NAD. AAO X 3.  Vascular Examination: CFT <5 seconds b/l LE. Palpable pedal pulses b/l LE. Pedal hair absent. No pain with calf compression b/l. Lower extremity skin temperature gradient within normal limits. No edema noted b/l LE. No ischemia or gangrene noted b/l LE. No cyanosis or clubbing noted b/l LE.  Dermatological Examination: Pedal skin thin and atrophic b/l LE. No interdigital macerations noted b/l LE. Toenails 1-5 b/l elongated, discolored, dystrophic, thickened, crumbly with subungual debris and tenderness to dorsal palpation. Hyperkeratotic lesion(s) L hallux.  No erythema, no edema, no drainage, no fluctuance.      Wound Location: submet head 4 right foot There is a minimal amount of devitalized tissue present in the wound. Predebridement Wound Measurement:  1.0  x 1.0 cm hyperkeratoses, elevated with tenderness to palpation. Postdebridement Wound Measurement: 0.6 x 0.4 x 0.2 cm. Wound Base: pink, granular Peri-wound: Macerated Exudate: None: wound tissue dry Blood Loss during debridement: 0 cc('s). Material in wound which inhibits healing/promotes adjacent tissue breakdown:  nonviable hyperkeratosis. Description of tissue removed from ulceration today:   nonviable hyperkeratosis and necrotic tissue . Sign(s) of clinical bacterial infection: no clinical signs of infection noted on examination today.   Musculoskeletal Examination: Muscle strength 5/5 to all lower extremity muscle groups bilaterally. Hallux valgus with bunion deformity noted b/l lower extremities. Hammertoe deformity noted 2-5 b/l.  Footwear  Assessment: Does the patient wear appropriate shoes? Yes. Does the patient need inserts/orthotics? Yes.  Neurological Examination: Pt has subjective symptoms of neuropathy. Protective sensation diminished with 10g monofilament b/l. Vibratory sensation diminished b/l. Proprioception intact bilaterally.  Hemoglobin A1C Latest Ref Rng & Units 05/03/2021 02/25/2021 12/15/2020  HGBA1C 4.8 - 5.6 % 5.8(H) 4.9 4.6  Some recent data might be hidden   Xray findings right foot: No gas in tissues right foot. No bone erosion noted at location of ulceration submet head 4 right foot. Fracture noted 5th metatarsal head right foot. No foreign body evident right foot.   Assessment: 1. Onychomycosis   2. Diabetic ulcer of right midfoot associated with type 2 diabetes mellitus, with fat layer exposed ([place])   3. Callus   4. Acquired hammertoes of both feet   5. Diabetes mellitus due to underlying condition with diabetic polyneuropathy, unspecified whether long term insulin use ([place])   6. Encounter for diabetic foot exam ([place])     ADA Risk Categorization: High Risk  Patient has one or more of the following: Loss of protective sensation Absent pedal pulses Severe Foot deformity History of foot ulcer  Plan: -Diabetic foot examination performed today. -Continue foot and shoe inspections daily. Monitor blood glucose per PCP/Endocrinologist's recommendations. -Ulcer was debrided and reactive hyperkeratoses and necrotic tissue was resected to the level of bleeding or viable tissue. Ulcer was cleansed with wound cleanser. Iodosorb Gel was applied to base of wound with light dressing. -Xrays right foot taken and reviewed with patient/POA in office. -Wound culture taken today of right foot ulcer. -Patient has surgical shoe at home and is to resume wearing it. -Rx for doxycycline 100 mg po bid x 10 days. -Patient was given instructions on offloading and dressing change/aftercare  and was instructed to call  immediately if any signs or symptoms of infection arise.  -Mycotic toenails 1-5 bilaterally were debrided in length and girth with sterile nail nippers and dremel without incident. -Callus(es) L hallux pared utilizing sterile scalpel blade without complication or incident. Total number debrided =1. -Xray of right foot foot was performed and reviewed with patient and/or POA. -Patient scheduled to see Dr. Lanae Crumbly in one week for follow up of diabetic ulcer right foot. -Patient instructed to report to emergency department with worsening appearance of ulcer/toe/foot, increased pain, foul odor, increased redness, swelling, drainage, fever, chills, nightsweats, nausea, vomiting, increased blood sugar.  -Patient/POA to call should there be question/concern in the interim.  Return in about 1 week (around 11/23/2021).  Marzetta Board, DPM

## 2021-11-17 DIAGNOSIS — N2581 Secondary hyperparathyroidism of renal origin: Secondary | ICD-10-CM | POA: Diagnosis not present

## 2021-11-17 DIAGNOSIS — E1122 Type 2 diabetes mellitus with diabetic chronic kidney disease: Secondary | ICD-10-CM | POA: Diagnosis not present

## 2021-11-17 DIAGNOSIS — Z992 Dependence on renal dialysis: Secondary | ICD-10-CM | POA: Diagnosis not present

## 2021-11-17 DIAGNOSIS — D631 Anemia in chronic kidney disease: Secondary | ICD-10-CM | POA: Diagnosis not present

## 2021-11-17 DIAGNOSIS — N186 End stage renal disease: Secondary | ICD-10-CM | POA: Diagnosis not present

## 2021-11-18 ENCOUNTER — Encounter: Payer: Self-pay | Admitting: Podiatry

## 2021-11-19 DIAGNOSIS — D631 Anemia in chronic kidney disease: Secondary | ICD-10-CM | POA: Diagnosis not present

## 2021-11-19 DIAGNOSIS — Z992 Dependence on renal dialysis: Secondary | ICD-10-CM | POA: Diagnosis not present

## 2021-11-19 DIAGNOSIS — N186 End stage renal disease: Secondary | ICD-10-CM | POA: Diagnosis not present

## 2021-11-19 DIAGNOSIS — E1122 Type 2 diabetes mellitus with diabetic chronic kidney disease: Secondary | ICD-10-CM | POA: Diagnosis not present

## 2021-11-19 DIAGNOSIS — N2581 Secondary hyperparathyroidism of renal origin: Secondary | ICD-10-CM | POA: Diagnosis not present

## 2021-11-19 LAB — WOUND CULTURE
MICRO NUMBER:: 12912564
SPECIMEN QUALITY:: ADEQUATE

## 2021-11-19 LAB — HOUSE ACCOUNT TRACKING

## 2021-11-22 ENCOUNTER — Other Ambulatory Visit: Payer: Self-pay | Admitting: Nurse Practitioner

## 2021-11-22 DIAGNOSIS — D631 Anemia in chronic kidney disease: Secondary | ICD-10-CM | POA: Diagnosis not present

## 2021-11-22 DIAGNOSIS — N186 End stage renal disease: Secondary | ICD-10-CM | POA: Diagnosis not present

## 2021-11-22 DIAGNOSIS — N2581 Secondary hyperparathyroidism of renal origin: Secondary | ICD-10-CM | POA: Diagnosis not present

## 2021-11-22 DIAGNOSIS — Z992 Dependence on renal dialysis: Secondary | ICD-10-CM | POA: Diagnosis not present

## 2021-11-22 DIAGNOSIS — E1122 Type 2 diabetes mellitus with diabetic chronic kidney disease: Secondary | ICD-10-CM | POA: Diagnosis not present

## 2021-11-22 DIAGNOSIS — I5042 Chronic combined systolic (congestive) and diastolic (congestive) heart failure: Secondary | ICD-10-CM

## 2021-11-23 ENCOUNTER — Ambulatory Visit (INDEPENDENT_AMBULATORY_CARE_PROVIDER_SITE_OTHER): Payer: Medicare Other | Admitting: Podiatry

## 2021-11-23 ENCOUNTER — Other Ambulatory Visit: Payer: Self-pay

## 2021-11-23 ENCOUNTER — Encounter: Payer: Self-pay | Admitting: Podiatry

## 2021-11-23 DIAGNOSIS — E11621 Type 2 diabetes mellitus with foot ulcer: Secondary | ICD-10-CM | POA: Diagnosis not present

## 2021-11-23 DIAGNOSIS — L97411 Non-pressure chronic ulcer of right heel and midfoot limited to breakdown of skin: Secondary | ICD-10-CM | POA: Diagnosis not present

## 2021-11-23 DIAGNOSIS — I129 Hypertensive chronic kidney disease with stage 1 through stage 4 chronic kidney disease, or unspecified chronic kidney disease: Secondary | ICD-10-CM | POA: Diagnosis not present

## 2021-11-23 DIAGNOSIS — Z992 Dependence on renal dialysis: Secondary | ICD-10-CM | POA: Diagnosis not present

## 2021-11-23 DIAGNOSIS — N186 End stage renal disease: Secondary | ICD-10-CM | POA: Diagnosis not present

## 2021-11-23 NOTE — Progress Notes (Signed)
Subjective:  [name], male    DOB: 06/23/1951,   MRN: [id number]  Chief Complaint  Patient presents with   Foot Ulcer    Pt is here to f/u ulcer on right foot . No redness, drainage, swelling. No n/v/f/sob    71 y.o. male presents for follow-up of  ulcer. Was seen by Dr. Elisha Ponder for routine foot care and found to have the pre-ulcerative lesion site open to an ulcer. Cultures were taken and placed on antibiotic and told to follow-up with me. . Has not been wearing surgical shoe. Has been dressing daily with iodosorb. Last A1c was 5.8  . Denies any other pedal complaints. Denies n/v/f/c.    PCP: Alain Honey MD  Past Medical History:  Diagnosis Date   Acute respiratory failure ([place]) 03/01/2018   Anemia    Arthritis    "all over; mostly knees and back" (02/28/2018)   Chronic combined systolic and diastolic CHF (congestive heart failure) (HCC)    Chronic lower back pain    stenosis   Community acquired pneumonia 09/06/2013   COPD (chronic obstructive pulmonary disease) ([place])    Coronary atherosclerosis of native coronary artery    a. 02/2003 s/p CABG x 2 (VG->RI, VG->RPDA; b. 11/2019 PCI: LM nl, LAD 90d, D3 50, RI 100, LCX 100p, OM3 100 - fills via L->L collats from D2/dLAD, RCA 100p, VG->RPDA ok, VG->RI 95 (3.5x48 Synergy XD DES).   Drug abuse ([place])    hx; tested for cocaine as recently as 2/08. says he is not using drugs now - avoided defib. for this reason    ESRD (end stage renal disease) ([place])    Hemo M-W-F- Richarda Blade   Fall at home 10/2020   GERD (gastroesophageal reflux disease)    takes OTC meds as needed   GI bleeding    a. 11/2019 EGD: angiodysplastic lesions w/ bleeding s/p argon plasma/clipping/epi inj. Multiple admissions for the same.   Glaucoma    uses eye drops daily   Hepatitis B 1968   "tx'd w/isolation; caught it from toilet stools in gym"   History of blood transfusion 03/01/2019   History of colon polyps    benign   History of gout    takes  Allopurinol daily as well as Colchicine-if needed (02/28/2018)   History of kidney stones    HTN (hypertension)    takes Coreg,Imdur.and Apresoline daily   Human immunodeficiency virus (HIV) disease ([place]) dx'd 1995   on [place] as of 12/2020.     Hyperlipidemia    Ischemic cardiomyopathy    a. 01/2019 Echo: EF 40-45%, diffuse HK, mild basal septal hypertrophy. Diast dysfxn. Nl RV size/fxn. Sev dil LA. Triv MR/TR/PR.   Muscle spasm    takes Zanaflex as needed   Myocardial infarction ([place]) ~ 2004/2005   Nocturia    Peripheral neuropathy    takes gabapentin daily   Pneumonia    "at least twice" (02/28/2018)   SDH (subdural hematoma)    Syphilis, unspecified    Type II diabetes mellitus ([place]) 2004   Lantus daily.Average fasting blood sugar 125-199   Wears glasses    Wears partial dentures     Objective:  Physical Exam: Vascular: DP/PT pulses 2/4 bilateral. CFT <3 seconds. Normal hair growth on digits. No edema.  Skin. No lacerations or abrasions bilateral feet. Nails 1-5 are thickened discolored and elongated with subungual debris.  Hyperkeratotic tissue noted sub right foot. Ulcer noted upon debridement measuring about 0.4 cm x0.4  cm x 0.1 cm with granular base. No erythema edema or purulence noted.  Musculoskeletal: MMT 5/5 bilateral lower extremities in DF, PF, Inversion and Eversion. Deceased ROM in DF of ankle joint.  Neurological: Sensation intact to light touch.   Assessment:   1. Diabetic ulcer of right midfoot associated with type 2 diabetes mellitus, limited to breakdown of skin ([place])      Plan:  Patient was evaluated and treated and all questions answered. Ulcer Right fourth metatarsal plantar foot limited to breakdown of skin.  -Debridement as below. -Dressed with iodosorb, DSD. -Off-loading with surgical shoe. Has one at home. Discussed wearing whenever walking on the foot. Patient relates he often does not wear it but states he rarely walks.  -No abx indicated.  Cultures did grow proteus finished his course of antibiotics yesterday.  -Discussed glucose control and proper protein-rich diet.  -Discussed if any worsening redness, pain, fever or chills to call or may need to report to the emergency room. Patient expressed understanding.   Procedure: Excisional Debridement of Wound Rationale: Removal of non-viable soft tissue from the wound to promote healing.  Anesthesia: none Pre-Debridement Wound Measurements:Overlying callus  Post-Debridement Wound Measurements: 0.4 cm x 0.4 cm x 0.1 cm  Type of Debridement: Sharp Excisional Tissue Removed: Non-viable soft tissue Depth of Debridement: subcutaneous tissue. Technique: Sharp excisional debridement to bleeding, viable wound base.  Dressing: Dry, sterile, compression dressing. Disposition: Patient tolerated procedure well. Patient to return in 2 week for follow-up.  Return in about 2 weeks (around 12/07/2021) for wound check.    Lorenda Peck, DPM

## 2021-11-24 DIAGNOSIS — N2581 Secondary hyperparathyroidism of renal origin: Secondary | ICD-10-CM | POA: Diagnosis not present

## 2021-11-24 DIAGNOSIS — N186 End stage renal disease: Secondary | ICD-10-CM | POA: Diagnosis not present

## 2021-11-24 DIAGNOSIS — Z992 Dependence on renal dialysis: Secondary | ICD-10-CM | POA: Diagnosis not present

## 2021-11-24 DIAGNOSIS — E8770 Fluid overload, unspecified: Secondary | ICD-10-CM | POA: Diagnosis not present

## 2021-11-24 DIAGNOSIS — D631 Anemia in chronic kidney disease: Secondary | ICD-10-CM | POA: Diagnosis not present

## 2021-11-25 ENCOUNTER — Encounter (HOSPITAL_COMMUNITY): Payer: Self-pay

## 2021-11-26 DIAGNOSIS — D631 Anemia in chronic kidney disease: Secondary | ICD-10-CM | POA: Diagnosis not present

## 2021-11-26 DIAGNOSIS — N2581 Secondary hyperparathyroidism of renal origin: Secondary | ICD-10-CM | POA: Diagnosis not present

## 2021-11-26 DIAGNOSIS — Z992 Dependence on renal dialysis: Secondary | ICD-10-CM | POA: Diagnosis not present

## 2021-11-26 DIAGNOSIS — N186 End stage renal disease: Secondary | ICD-10-CM | POA: Diagnosis not present

## 2021-11-26 DIAGNOSIS — E8770 Fluid overload, unspecified: Secondary | ICD-10-CM | POA: Diagnosis not present

## 2021-11-29 DIAGNOSIS — N186 End stage renal disease: Secondary | ICD-10-CM | POA: Diagnosis not present

## 2021-11-29 DIAGNOSIS — Z992 Dependence on renal dialysis: Secondary | ICD-10-CM | POA: Diagnosis not present

## 2021-11-29 DIAGNOSIS — E8770 Fluid overload, unspecified: Secondary | ICD-10-CM | POA: Diagnosis not present

## 2021-11-29 DIAGNOSIS — D631 Anemia in chronic kidney disease: Secondary | ICD-10-CM | POA: Diagnosis not present

## 2021-11-29 DIAGNOSIS — N2581 Secondary hyperparathyroidism of renal origin: Secondary | ICD-10-CM | POA: Diagnosis not present

## 2021-12-01 ENCOUNTER — Other Ambulatory Visit (HOSPITAL_COMMUNITY): Payer: Self-pay

## 2021-12-01 DIAGNOSIS — Z992 Dependence on renal dialysis: Secondary | ICD-10-CM | POA: Diagnosis not present

## 2021-12-01 DIAGNOSIS — N186 End stage renal disease: Secondary | ICD-10-CM | POA: Diagnosis not present

## 2021-12-01 DIAGNOSIS — D631 Anemia in chronic kidney disease: Secondary | ICD-10-CM | POA: Diagnosis not present

## 2021-12-01 DIAGNOSIS — N2581 Secondary hyperparathyroidism of renal origin: Secondary | ICD-10-CM | POA: Diagnosis not present

## 2021-12-01 DIAGNOSIS — E8770 Fluid overload, unspecified: Secondary | ICD-10-CM | POA: Diagnosis not present

## 2021-12-01 LAB — IRON,TIBC AND FERRITIN PANEL
Iron: 72
TIBC: 320
UIBC: 248

## 2021-12-01 LAB — CBC: RBC: 3.55 — AB (ref 3.87–5.11)

## 2021-12-01 LAB — BASIC METABOLIC PANEL
BUN: 81 — AB (ref 4–21)
Chloride: 97 — AB (ref 99–108)
Creatinine: 10.4 — AB (ref 0.6–1.3)
Glucose: 226
Potassium: 5.2 (ref 3.4–5.3)
Sodium: 137 (ref 137–147)

## 2021-12-01 LAB — CBC AND DIFFERENTIAL
HCT: 36 — AB (ref 41–53)
Hemoglobin: 12 — AB (ref 13.5–17.5)
Platelets: 156 (ref 150–399)
WBC: 4.7

## 2021-12-01 LAB — COMPREHENSIVE METABOLIC PANEL: Calcium: 10.1 (ref 8.7–10.7)

## 2021-12-02 ENCOUNTER — Other Ambulatory Visit: Payer: Self-pay

## 2021-12-02 ENCOUNTER — Ambulatory Visit (HOSPITAL_COMMUNITY)
Admission: RE | Admit: 2021-12-02 | Discharge: 2021-12-02 | Disposition: A | Discharge status: Home / Self Care | Payer: Medicare Other | Source: Ambulatory Visit | Attending: Cardiology | Admitting: Cardiology

## 2021-12-02 ENCOUNTER — Other Ambulatory Visit (HOSPITAL_COMMUNITY): Payer: Self-pay

## 2021-12-02 DIAGNOSIS — Q2730 Arteriovenous malformation, site unspecified: Secondary | ICD-10-CM | POA: Diagnosis not present

## 2021-12-02 MED ORDER — OCTREOTIDE ACETATE 10 MG IM KIT
10.0000 mg | PACK | INTRAMUSCULAR | Status: DC
  Administered 2021-12-02: 10 mg via INTRAMUSCULAR
  Filled 2021-12-02: qty 1

## 2021-12-03 DIAGNOSIS — E8770 Fluid overload, unspecified: Secondary | ICD-10-CM | POA: Diagnosis not present

## 2021-12-03 DIAGNOSIS — N2581 Secondary hyperparathyroidism of renal origin: Secondary | ICD-10-CM | POA: Diagnosis not present

## 2021-12-03 DIAGNOSIS — Z992 Dependence on renal dialysis: Secondary | ICD-10-CM | POA: Diagnosis not present

## 2021-12-03 DIAGNOSIS — D631 Anemia in chronic kidney disease: Secondary | ICD-10-CM | POA: Diagnosis not present

## 2021-12-03 DIAGNOSIS — N186 End stage renal disease: Secondary | ICD-10-CM | POA: Diagnosis not present

## 2021-12-06 DIAGNOSIS — Z992 Dependence on renal dialysis: Secondary | ICD-10-CM | POA: Diagnosis not present

## 2021-12-06 DIAGNOSIS — D631 Anemia in chronic kidney disease: Secondary | ICD-10-CM | POA: Diagnosis not present

## 2021-12-06 DIAGNOSIS — N2581 Secondary hyperparathyroidism of renal origin: Secondary | ICD-10-CM | POA: Diagnosis not present

## 2021-12-06 DIAGNOSIS — E8770 Fluid overload, unspecified: Secondary | ICD-10-CM | POA: Diagnosis not present

## 2021-12-06 DIAGNOSIS — N186 End stage renal disease: Secondary | ICD-10-CM | POA: Diagnosis not present

## 2021-12-07 ENCOUNTER — Other Ambulatory Visit: Payer: Self-pay

## 2021-12-07 ENCOUNTER — Encounter: Payer: Self-pay | Admitting: Podiatry

## 2021-12-07 ENCOUNTER — Ambulatory Visit (INDEPENDENT_AMBULATORY_CARE_PROVIDER_SITE_OTHER): Payer: Medicare Other | Admitting: Podiatry

## 2021-12-07 ENCOUNTER — Other Ambulatory Visit: Payer: Self-pay | Admitting: *Deleted

## 2021-12-07 DIAGNOSIS — L97411 Non-pressure chronic ulcer of right heel and midfoot limited to breakdown of skin: Secondary | ICD-10-CM

## 2021-12-07 DIAGNOSIS — M48061 Spinal stenosis, lumbar region without neurogenic claudication: Secondary | ICD-10-CM

## 2021-12-07 DIAGNOSIS — E11621 Type 2 diabetes mellitus with foot ulcer: Secondary | ICD-10-CM | POA: Diagnosis not present

## 2021-12-07 MED ORDER — OXYCODONE-ACETAMINOPHEN 5-325 MG PO TABS: 1.0000 | ORAL_TABLET | ORAL | 0 refills | Status: DC | PRN

## 2021-12-07 NOTE — Telephone Encounter (Signed)
Patient called requesting refill.  Epic LR: 11/10/2021 Contract Date: 07/20/2021 Pended Rx and sent to Dr. Sabra Heck for approval.

## 2021-12-07 NOTE — Progress Notes (Signed)
Subjective:  Patient ID: ELVA BREAKER, male    DOB: Dec 05, 1950,   MRN: [id number]  Chief Complaint  Patient presents with   Foot Ulcer    2 WEEK F/U DIABETIC ULCER/[facility] for Diabetic Shoes     71 y.o. male presents for follow-up of  ulcer. Relates it has been doing well and hoping to get fitted for shoes today.  Has not been wearing surgical shoe. Has been dressing daily with iodosorb. Last A1c was 5.8  . Denies any other pedal complaints. Denies n/v/f/c.    PCP: Alain Honey MD  Past Medical History:  Diagnosis Date   Acute respiratory failure ([place]) 03/01/2018   Anemia    Arthritis    "all over; mostly knees and back" (02/28/2018)   Chronic combined systolic and diastolic CHF (congestive heart failure) (HCC)    Chronic lower back pain    stenosis   Community acquired pneumonia 09/06/2013   COPD (chronic obstructive pulmonary disease) ([place])    Coronary atherosclerosis of native coronary artery    a. 02/2003 s/p CABG x 2 (VG->RI, VG->RPDA; b. 11/2019 PCI: LM nl, LAD 90d, D3 50, RI 100, LCX 100p, OM3 100 - fills via L->L collats from D2/dLAD, RCA 100p, VG->RPDA ok, VG->RI 95 (3.5x48 Synergy XD DES).   Drug abuse ([place])    hx; tested for cocaine as recently as 2/08. says he is not using drugs now - avoided defib. for this reason    ESRD (end stage renal disease) ([place])    Hemo M-W-F- [facility] Blade   Fall at home 10/2020   GERD (gastroesophageal reflux disease)    takes OTC meds as needed   GI bleeding    a. 11/2019 EGD: angiodysplastic lesions w/ bleeding s/p argon plasma/clipping/epi inj. Multiple admissions for the same.   Glaucoma    uses eye drops daily   Hepatitis B 1968   "tx'd w/isolation; caught it from toilet stools in gym"   History of blood transfusion 03/01/2019   History of colon polyps    benign   History of gout    takes Allopurinol daily as well as Colchicine-if needed (02/28/2018)   History of kidney stones    HTN (hypertension)    takes Coreg,Imdur.and Apresoline  daily   Human immunodeficiency virus (HIV) disease ([place]) dx'd 1995   on [place] as of 12/2020.     Hyperlipidemia    Ischemic cardiomyopathy    a. 01/2019 Echo: EF 40-45%, diffuse HK, mild basal septal hypertrophy. Diast dysfxn. Nl RV size/fxn. Sev dil LA. Triv MR/TR/PR.   Muscle spasm    takes Zanaflex as needed   Myocardial infarction ([place]) ~ 2004/2005   Nocturia    Peripheral neuropathy    takes gabapentin daily   Pneumonia    "at least twice" (02/28/2018)   SDH (subdural hematoma)    Syphilis, unspecified    Type II diabetes mellitus ([place]) 2004   Lantus daily.Average fasting blood sugar 125-199   Wears glasses    Wears partial dentures     Objective:  Physical Exam: Vascular: DP/PT pulses 2/4 bilateral. CFT <3 seconds. Normal hair growth on digits. No edema.  Skin. No lacerations or abrasions bilateral feet. Nails 1-5 are thickened discolored and elongated with subungual debris.  Hyperkeratotic tissue noted sub right foot. Ulcer noted upon debridement measuring about 0.3 cm x0.2 cm x 0.1 cm with granular base. No erythema edema or purulence noted.  Musculoskeletal: MMT 5/5 bilateral lower extremities in DF, PF, Inversion and Eversion. Deceased  ROM in DF of ankle joint.  Neurological: Sensation intact to light touch.   Assessment:   No diagnosis found.    Plan:  Patient was evaluated and treated and all questions answered. Ulcer Right fourth metatarsal plantar foot limited to breakdown of skin.  -Debridement as below. -Dressed with iodosorb, DSD. -Off-loading with surgical shoe. Has one at home. Discussed wearing whenever walking on the foot. Patient relates he often does not wear it but states he rarely walks. Discussed again making sure to wear the surgical shoe.  -No abx indicated. Cultures did grow proteus finished his course of antibiotics yesterday.  -Discussed glucose control and proper protein-rich diet.  -Discussed if any worsening redness, pain, fever or chills  to call or may need to report to the emergency room. Patient expressed understanding.   Procedure: Excisional Debridement of Wound Rationale: Removal of non-viable soft tissue from the wound to promote healing.  Anesthesia: none Pre-Debridement Wound Measurements:Overlying callus  Post-Debridement Wound Measurements: 0.3 cm x 0.2 cm x 0.1 cm  Type of Debridement: Sharp Excisional Tissue Removed: Non-viable soft tissue Depth of Debridement: subcutaneous tissue. Technique: Sharp excisional debridement to bleeding, viable wound base.  Dressing: Dry, sterile, compression dressing. Disposition: Patient tolerated procedure well. Patient to return in 2 week for follow-up.  No follow-ups on file.    Lorenda Peck, DPM

## 2021-12-08 DIAGNOSIS — N186 End stage renal disease: Secondary | ICD-10-CM | POA: Diagnosis not present

## 2021-12-08 DIAGNOSIS — Z992 Dependence on renal dialysis: Secondary | ICD-10-CM | POA: Diagnosis not present

## 2021-12-08 DIAGNOSIS — N2581 Secondary hyperparathyroidism of renal origin: Secondary | ICD-10-CM | POA: Diagnosis not present

## 2021-12-08 DIAGNOSIS — E8770 Fluid overload, unspecified: Secondary | ICD-10-CM | POA: Diagnosis not present

## 2021-12-08 DIAGNOSIS — D631 Anemia in chronic kidney disease: Secondary | ICD-10-CM | POA: Diagnosis not present

## 2021-12-08 LAB — CBC AND DIFFERENTIAL: Hemoglobin: 12.5 — AB (ref 13.5–17.5)

## 2021-12-10 DIAGNOSIS — N186 End stage renal disease: Secondary | ICD-10-CM | POA: Diagnosis not present

## 2021-12-10 DIAGNOSIS — Z992 Dependence on renal dialysis: Secondary | ICD-10-CM | POA: Diagnosis not present

## 2021-12-10 DIAGNOSIS — E8770 Fluid overload, unspecified: Secondary | ICD-10-CM | POA: Diagnosis not present

## 2021-12-10 DIAGNOSIS — N2581 Secondary hyperparathyroidism of renal origin: Secondary | ICD-10-CM | POA: Diagnosis not present

## 2021-12-10 DIAGNOSIS — D631 Anemia in chronic kidney disease: Secondary | ICD-10-CM | POA: Diagnosis not present

## 2021-12-13 DIAGNOSIS — N2581 Secondary hyperparathyroidism of renal origin: Secondary | ICD-10-CM | POA: Diagnosis not present

## 2021-12-13 DIAGNOSIS — N186 End stage renal disease: Secondary | ICD-10-CM | POA: Diagnosis not present

## 2021-12-13 DIAGNOSIS — D631 Anemia in chronic kidney disease: Secondary | ICD-10-CM | POA: Diagnosis not present

## 2021-12-13 DIAGNOSIS — Z992 Dependence on renal dialysis: Secondary | ICD-10-CM | POA: Diagnosis not present

## 2021-12-13 DIAGNOSIS — E8770 Fluid overload, unspecified: Secondary | ICD-10-CM | POA: Diagnosis not present

## 2021-12-15 ENCOUNTER — Encounter: Payer: Self-pay | Admitting: *Deleted

## 2021-12-15 ENCOUNTER — Telehealth: Payer: Self-pay

## 2021-12-15 DIAGNOSIS — Z992 Dependence on renal dialysis: Secondary | ICD-10-CM | POA: Diagnosis not present

## 2021-12-15 DIAGNOSIS — N186 End stage renal disease: Secondary | ICD-10-CM | POA: Diagnosis not present

## 2021-12-15 DIAGNOSIS — D631 Anemia in chronic kidney disease: Secondary | ICD-10-CM | POA: Diagnosis not present

## 2021-12-15 DIAGNOSIS — E8770 Fluid overload, unspecified: Secondary | ICD-10-CM | POA: Diagnosis not present

## 2021-12-15 DIAGNOSIS — N2581 Secondary hyperparathyroidism of renal origin: Secondary | ICD-10-CM | POA: Diagnosis not present

## 2021-12-15 NOTE — Progress Notes (Signed)
[facility] [phone] Fax: [phone]

## 2021-12-15 NOTE — Telephone Encounter (Signed)
Patient called c/o right side pain in which he will have Dr.Miller access next week however he would like to know if any adjustments or additions can be made to help relieve pain prior to next Tuesday. Patient states pain is so bad he had to leave dialysis today.   Patient states he only wants Dr.Miller to address this message and refused the idea of me sending this message to a covering nurse practitioner.

## 2021-12-16 DIAGNOSIS — D631 Anemia in chronic kidney disease: Secondary | ICD-10-CM | POA: Diagnosis not present

## 2021-12-16 DIAGNOSIS — E8770 Fluid overload, unspecified: Secondary | ICD-10-CM | POA: Diagnosis not present

## 2021-12-16 DIAGNOSIS — N2581 Secondary hyperparathyroidism of renal origin: Secondary | ICD-10-CM | POA: Diagnosis not present

## 2021-12-16 DIAGNOSIS — Z992 Dependence on renal dialysis: Secondary | ICD-10-CM | POA: Diagnosis not present

## 2021-12-16 DIAGNOSIS — N186 End stage renal disease: Secondary | ICD-10-CM | POA: Diagnosis not present

## 2021-12-16 NOTE — Telephone Encounter (Signed)
Jacob Honour, MD  Leigh Aurora C, CMA 4 minutes ago (4:44 PM)   I am reluctant to say take more pain med which [name] mask a problem that needs attention now

## 2021-12-16 NOTE — Telephone Encounter (Signed)
Called and spoke with patient. Patient stated that he will talk with Dr. Sabra Heck when he comes in for an appointment next week. Stated that it was not an emergency and he Does NOT want to go to the ER. Stated that the pain is not serious. Stated that he is good. Stated that he will call back if anything changes.

## 2021-12-17 DIAGNOSIS — D631 Anemia in chronic kidney disease: Secondary | ICD-10-CM | POA: Diagnosis not present

## 2021-12-17 DIAGNOSIS — Z992 Dependence on renal dialysis: Secondary | ICD-10-CM | POA: Diagnosis not present

## 2021-12-17 DIAGNOSIS — E8770 Fluid overload, unspecified: Secondary | ICD-10-CM | POA: Diagnosis not present

## 2021-12-17 DIAGNOSIS — N186 End stage renal disease: Secondary | ICD-10-CM | POA: Diagnosis not present

## 2021-12-17 DIAGNOSIS — N2581 Secondary hyperparathyroidism of renal origin: Secondary | ICD-10-CM | POA: Diagnosis not present

## 2021-12-20 DIAGNOSIS — D631 Anemia in chronic kidney disease: Secondary | ICD-10-CM | POA: Diagnosis not present

## 2021-12-20 DIAGNOSIS — Z992 Dependence on renal dialysis: Secondary | ICD-10-CM | POA: Diagnosis not present

## 2021-12-20 DIAGNOSIS — N2581 Secondary hyperparathyroidism of renal origin: Secondary | ICD-10-CM | POA: Diagnosis not present

## 2021-12-20 DIAGNOSIS — E8770 Fluid overload, unspecified: Secondary | ICD-10-CM | POA: Diagnosis not present

## 2021-12-20 DIAGNOSIS — N186 End stage renal disease: Secondary | ICD-10-CM | POA: Diagnosis not present

## 2021-12-21 ENCOUNTER — Ambulatory Visit (INDEPENDENT_AMBULATORY_CARE_PROVIDER_SITE_OTHER): Payer: Medicare Other | Admitting: Family Medicine

## 2021-12-21 ENCOUNTER — Ambulatory Visit: Payer: Medicare Other | Admitting: Podiatry

## 2021-12-21 ENCOUNTER — Encounter: Payer: Self-pay | Admitting: Family Medicine

## 2021-12-21 ENCOUNTER — Other Ambulatory Visit: Payer: Self-pay

## 2021-12-21 VITALS — BP 154/70 | HR 82 | Temp 97.1°F | Ht 71.0 in | Wt 174.2 lb

## 2021-12-21 DIAGNOSIS — M48061 Spinal stenosis, lumbar region without neurogenic claudication: Secondary | ICD-10-CM | POA: Diagnosis not present

## 2021-12-21 DIAGNOSIS — I129 Hypertensive chronic kidney disease with stage 1 through stage 4 chronic kidney disease, or unspecified chronic kidney disease: Secondary | ICD-10-CM | POA: Diagnosis not present

## 2021-12-21 DIAGNOSIS — N186 End stage renal disease: Secondary | ICD-10-CM | POA: Diagnosis not present

## 2021-12-21 DIAGNOSIS — F1190 Opioid use, unspecified, uncomplicated: Secondary | ICD-10-CM

## 2021-12-21 DIAGNOSIS — I5042 Chronic combined systolic (congestive) and diastolic (congestive) heart failure: Secondary | ICD-10-CM

## 2021-12-21 DIAGNOSIS — Z992 Dependence on renal dialysis: Secondary | ICD-10-CM | POA: Diagnosis not present

## 2021-12-21 MED FILL — Octreotide Acetate For IM Inj Kit 10 MG: INTRAMUSCULAR | Qty: 1 | Status: AC

## 2021-12-21 NOTE — Progress Notes (Signed)
Provider:  Alain Honey, MD  Careteam: Patient Care Team: [name], MD as PCP - General (Family Medicine) [name], MD as PCP - Infectious Diseases (Infectious Diseases) [name], MD as PCP - Cardiology (Cardiology) [name], MD as Consulting Physician (Ophthalmology) [name], MD as Consulting Physician (Cardiology) [name], MD as Consulting Physician (Infectious Diseases) Gardiner Barefoot, DPM as Consulting Physician (Podiatry) [name], Nelda Bucks, NP as Nurse Practitioner (Family Medicine) Kidney, [place]:  [place]  Advanced Directive information    Allergies  Allergen Reactions   Augmentin [Amoxicillin-Pot Clavulanate] Diarrhea and Other (See Comments)    Severe diarrhea   Mucinex Fast-Max Other (See Comments)    Intense sweating    Amphetamines Other (See Comments)    Unknown reaction    Chief Complaint  Patient presents with   Medical Management of Chronic Issues    Patient presents today for a 2 month follow-up.     HPI: Patient is a 71 y.o. male .  Pain which patient complained of last week was in his left upper arm at the site of his AV shunt.  Pain has improved.  He reports taking 1 extra oxycodone.  Apparently they plan to redo his shunt.  Message I received was related to abdominal pain but he denies that today.  Review of Systems:  Review of Systems  Constitutional: Negative.   Respiratory: Negative.    Cardiovascular: Negative.   Musculoskeletal:  Positive for back pain.  All other systems reviewed and are negative.  Past Medical History:  Diagnosis Date   Acute respiratory failure ([place]) 03/01/2018   Anemia    Arthritis    "all over; mostly knees and back" (02/28/2018)   Chronic combined systolic and diastolic CHF (congestive heart failure) (HCC)    Chronic lower back pain    stenosis   Community acquired pneumonia 09/06/2013   COPD (chronic obstructive pulmonary disease) ([place])     Coronary atherosclerosis of native coronary artery    a. 02/2003 s/p CABG x 2 (VG->RI, VG->RPDA; b. 11/2019 PCI: LM nl, LAD 90d, D3 50, RI 100, LCX 100p, OM3 100 - fills via L->L collats from D2/dLAD, RCA 100p, VG->RPDA ok, VG->RI 95 (3.5x48 Synergy XD DES).   Drug abuse ([place])    hx; tested for cocaine as recently as 2/08. says he is not using drugs now - avoided defib. for this reason    ESRD (end stage renal disease) ([place])    Hemo M-W-F- [facility]   Fall at home 10/2020   GERD (gastroesophageal reflux disease)    takes OTC meds as needed   GI bleeding    a. 11/2019 EGD: angiodysplastic lesions w/ bleeding s/p argon plasma/clipping/epi inj. Multiple admissions for the same.   Glaucoma    uses eye drops daily   Hepatitis B 1968   "tx'd w/isolation; caught it from toilet stools in gym"   History of blood transfusion 03/01/2019   History of colon polyps    benign   History of gout    takes Allopurinol daily as well as Colchicine-if needed (02/28/2018)   History of kidney stones    HTN (hypertension)    takes Coreg,Imdur.and Apresoline daily   Human immunodeficiency virus (HIV) disease ([place]) dx'd 1995   on [place] as of 12/2020.     Hyperlipidemia    Ischemic cardiomyopathy    a. 01/2019 Echo: EF 40-45%, diffuse HK, mild basal septal hypertrophy. Diast dysfxn. Nl RV  size/fxn. Sev dil LA. Triv MR/TR/PR.   Muscle spasm    takes Zanaflex as needed   Myocardial infarction ([place]) ~ 2004/2005   Nocturia    Peripheral neuropathy    takes gabapentin daily   Pneumonia    "at least twice" (02/28/2018)   SDH (subdural hematoma)    Syphilis, unspecified    Type II diabetes mellitus ([place]) 2004   Lantus daily.Average fasting blood sugar 125-199   Wears glasses    Wears partial dentures    Past Surgical History:  Procedure Laterality Date   AV FISTULA PLACEMENT Left 08/02/2018   Procedure: ARTERIOVENOUS (AV) FISTULA CREATION  left arm radiocephlic;  Surgeon: Marty Heck, MD;  Location: [place];  Service: Vascular;  Laterality: Left;   AV FISTULA PLACEMENT Left 08/01/2019   Procedure: LEFT BRACHIOCEPHALIC ARTERIOVENOUS (AV) FISTULA CREATION;  Surgeon: [name], MD;  Location: [place];  Service: Vascular;  Laterality: Left;   [place] Left 10/03/2019   Procedure: BASILIC VEIN TRANSPOSITION LEFT SECOND STAGE;  Surgeon: [name], MD;  Location: [place];  Service: Vascular;  Laterality: Left;   BIOPSY  01/25/2021   Procedure: BIOPSY;  Surgeon: Doran Stabler, MD;  Location: [facility] ENDOSCOPY;  Service: Gastroenterology;;   CARDIAC CATHETERIZATION  10/2002; 12/19/2004   [name] 03/08/2011   COLONOSCOPY  2013   [place]    CORONARY ARTERY BYPASS GRAFT  02/24/2003   CABG X2/notes 03/08/2011   CORONARY STENT INTERVENTION N/A 12/19/2019   Procedure: CORONARY STENT INTERVENTION;  Surgeon: Jettie Booze, MD;  Location: [place] CV LAB;  Service: Cardiovascular;  Laterality: N/A;   ENTEROSCOPY N/A 01/25/2021   Procedure: ENTEROSCOPY;  Surgeon: Doran Stabler, MD;  Location: [place];  Service: Gastroenterology;  Laterality: N/A;   ENTEROSCOPY N/A 02/13/2021   Procedure: ENTEROSCOPY;  Surgeon: Jackquline Denmark, MD;  Location: [facility] ENDOSCOPY;  Service: Endoscopy;  Laterality: N/A;   ENTEROSCOPY N/A 05/07/2021   Procedure: ENTEROSCOPY;  Surgeon: Yetta Flock, MD;  Location: [facility] ENDOSCOPY;  Service: Gastroenterology;  Laterality: N/A;   ESOPHAGOGASTRODUODENOSCOPY (EGD) WITH PROPOFOL N/A 02/08/2019   Procedure: ESOPHAGOGASTRODUODENOSCOPY (EGD) WITH PROPOFOL;  Surgeon: Milus Banister, MD;  Location: [place];  Service: Gastroenterology;  Laterality: N/A;   ESOPHAGOGASTRODUODENOSCOPY (EGD) WITH PROPOFOL N/A 12/22/2019   Procedure: ESOPHAGOGASTRODUODENOSCOPY (EGD) WITH PROPOFOL;  Surgeon: Lavena Bullion, DO;  Location: [place];  Service: Gastroenterology;  Laterality: N/A;   ESOPHAGOGASTRODUODENOSCOPY (EGD) WITH PROPOFOL N/A 10/19/2020   Procedure:  ESOPHAGOGASTRODUODENOSCOPY (EGD) WITH PROPOFOL;  Surgeon: Jackquline Denmark, MD;  Location: [facility] ENDOSCOPY;  Service: Endoscopy;  Laterality: N/A;   ESOPHAGOGASTRODUODENOSCOPY (EGD) WITH PROPOFOL N/A 12/22/2020   Procedure: ESOPHAGOGASTRODUODENOSCOPY (EGD) WITH PROPOFOL;  Surgeon: Gatha Mayer, MD;  Location: [place];  Service: Endoscopy;  Laterality: N/A;   ESOPHAGOGASTRODUODENOSCOPY (EGD) WITH PROPOFOL N/A 01/09/2021   Procedure: ESOPHAGOGASTRODUODENOSCOPY (EGD) WITH PROPOFOL;  Surgeon: Irene Shipper, MD;  Location: [facility] ENDOSCOPY;  Service: Endoscopy;  Laterality: N/A;   HEMOSTASIS CLIP PLACEMENT  12/22/2019   Procedure: HEMOSTASIS CLIP PLACEMENT;  Surgeon: Lavena Bullion, DO;  Location: [place];  Service: Gastroenterology;;   HEMOSTASIS CLIP PLACEMENT  12/22/2020   Procedure: HEMOSTASIS CLIP PLACEMENT;  Surgeon: Gatha Mayer, MD;  Location: [place];  Service: Endoscopy;;   HEMOSTASIS CONTROL  12/22/2020   Procedure: HEMOSTASIS CONTROL/hemospray;  Surgeon: Gatha Mayer, MD;  Location: [place];  Service: Endoscopy;;   HOT HEMOSTASIS N/A 02/08/2019   Procedure: HOT HEMOSTASIS (ARGON  PLASMA COAGULATION/BICAP);  Surgeon: Milus Banister, MD;  Location: [facility] ENDOSCOPY;  Service: Gastroenterology;  Laterality: N/A;   HOT HEMOSTASIS N/A 12/22/2019   Procedure: HOT HEMOSTASIS (ARGON PLASMA COAGULATION/BICAP);  Surgeon: Lavena Bullion, DO;  Location: [facility] ENDOSCOPY;  Service: Gastroenterology;  Laterality: N/A;   HOT HEMOSTASIS N/A 10/19/2020   Procedure: HOT HEMOSTASIS (ARGON PLASMA COAGULATION/BICAP);  Surgeon: Jackquline Denmark, MD;  Location: [facility] ENDOSCOPY;  Service: Endoscopy;  Laterality: N/A;   HOT HEMOSTASIS N/A 12/22/2020   Procedure: HOT HEMOSTASIS (ARGON PLASMA COAGULATION/BICAP);  Surgeon: Gatha Mayer, MD;  Location: [facility] ENDOSCOPY;  Service: Endoscopy;  Laterality: N/A;   HOT HEMOSTASIS N/A 01/09/2021   Procedure: HOT HEMOSTASIS (ARGON PLASMA COAGULATION/BICAP);  Surgeon: Irene Shipper, MD;  Location: [facility] ENDOSCOPY;  Service: Endoscopy;  Laterality: N/A;   HOT HEMOSTASIS N/A 01/25/2021   Procedure: HOT HEMOSTASIS (ARGON PLASMA COAGULATION/BICAP);  Surgeon: Doran Stabler, MD;  Location: [place] [address];  Service: Gastroenterology;  Laterality: N/A;   HOT HEMOSTASIS N/A 02/13/2021   Procedure: HOT HEMOSTASIS (ARGON PLASMA COAGULATION/BICAP);  Surgeon: Jackquline Denmark, MD;  Location: [facility] ENDOSCOPY;  Service: Endoscopy;  Laterality: N/A;   HOT HEMOSTASIS N/A 05/07/2021   Procedure: HOT HEMOSTASIS (ARGON PLASMA COAGULATION/BICAP);  Surgeon: Yetta Flock, MD;  Location: [facility] ENDOSCOPY;  Service: Gastroenterology;  Laterality: N/A;   INTERTROCHANTERIC HIP FRACTURE SURGERY Left 11/2006   [name] 03/08/2011   INTRAVASCULAR ULTRASOUND/IVUS N/A 12/19/2019   Procedure: Intravascular Ultrasound/IVUS;  Surgeon: Jettie Booze, MD;  Location: [place] CV LAB;  Service: Cardiovascular;  Laterality: N/A;   IR FLUORO GUIDE CV LINE RIGHT  07/24/2019   IR FLUORO GUIDE CV LINE RIGHT  07/30/2019   IR US GUIDE VASC ACCESS RIGHT  07/24/2019   IR US GUIDE VASC ACCESS RIGHT  07/30/2019   LAPAROSCOPIC CHOLECYSTECTOMY  05/2006   LIGATION OF COMPETING BRANCHES OF ARTERIOVENOUS FISTULA Left 11/05/2018   Procedure: LIGATION OF COMPETING BRANCHES OF ARTERIOVENOUS FISTULA  LEFT  ARM;  Surgeon: Marty Heck, MD;  [facility];  Service: Vascular;  Laterality: Left;   LUMBAR LAMINECTOMY/DECOMPRESSION MICRODISCECTOMY N/A 02/29/2016   Procedure: Left L4-5 Lateral Recess Decompression, Removal Extradural Intraspinal Facet Cyst;  Surgeon: Marybelle Killings, MD;  Location: [place];  Service: Orthopedics;  Laterality: N/A;   MULTIPLE TOOTH EXTRACTIONS     ORIF MANDIBULAR FRACTURE Left 08/13/2004   ORIF of left body fracture mandible with KLS Martin 2.3-mm six hole/notes 03/08/2011   RIGHT/LEFT HEART CATH AND CORONARY/GRAFT ANGIOGRAPHY N/A 12/19/2019   Procedure: RIGHT/LEFT HEART CATH AND CORONARY/GRAFT  ANGIOGRAPHY;  Surgeon: Jettie Booze, MD;  Location: [place] CV LAB;  Service: Cardiovascular;  Laterality: N/A;   SCLEROTHERAPY  12/22/2019   Procedure: SCLEROTHERAPY;  Surgeon: Lavena Bullion, DO;  Location: [place];  Service: Gastroenterology;;   Clide Deutscher  02/13/2021   Procedure: Clide Deutscher;  Surgeon: Jackquline Denmark, MD;  Location: [facility] ENDOSCOPY;  Service: Endoscopy;;   Social History:   reports that he has been smoking cigarettes. He has a 21.50 pack-year smoking history. He has never used smokeless tobacco. He reports that he does not currently use alcohol after a past usage of about 12.0 standard drinks per week. He reports that he does not currently use drugs after having used the following drugs: Cocaine.  Family History  Problem Relation Age of Onset   Heart failure Father    Hypertension Father    Diabetes Brother    Heart attack Brother    Alzheimer's disease Mother  Stroke Sister    Diabetes Sister    Alzheimer's disease Sister    Hypertension Brother    Diabetes Brother    Drug abuse Brother    Colon cancer Neg Hx     Medications: Patient's Medications  New Prescriptions   No medications on file  Previous Medications   ACETAMINOPHEN (TYLENOL) 650 MG CR TABLET    Take 1,300 mg by mouth 3 (three) times daily.   ALLOPURINOL (ZYLOPRIM) 100 MG TABLET    TAKE 1 TABLET(100 MG) BY MOUTH DAILY   ANORO ELLIPTA 62.5-25 MCG/INH AEPB    INHALE 1 PUFF BY MOUTH EVERY DAY   ATORVASTATIN (LIPITOR) 10 MG TABLET    Take 1 tablet (10 mg total) by mouth daily.   B-D UF III MINI PEN NEEDLES 31G X 5 MM MISC    USE FOUR TIMES DAILY   BIKTARVY 50-200-25 MG TABS TABLET    Take 1 tablet by mouth daily.   CALCIUM ACETATE 667 MG TABS    Take 667-1,334 mg by mouth See admin instructions. Take 1,334 mg by mouth three times a day with meals and 667 mg with each snack   CARVEDILOL (COREG) 3.125 MG TABLET    TAKE 1 TABLET(3.125 MG) BY MOUTH TWICE DAILY   COLCHICINE 0.6 MG  TABLET    TAKE 1 TABLET BY MOUTH AS NEEDED FOR GOUT   DICLOFENAC SODIUM (VOLTAREN) 1 % GEL    APPLY 2 GRAMS TOPICALLY TO THE AFFECTED AREA THREE TIMES DAILY AS NEEDED FOR PAIN   FOLIC ACID (FOLVITE) 1 MG TABLET    TAKE 1 TABLET(1 MG) BY MOUTH DAILY   GABAPENTIN (NEURONTIN) 300 MG CAPSULE    TAKE 1 CAPSULE BY MOUTH DAILY. DOSE CAHNGE DUE TO RENAL FUNCTION   GLUCOSE BLOOD (ONETOUCH ULTRA) TEST STRIP    Check blood sugar three times daily E11.40   INSULIN LISPRO (HUMALOG) 100 UNIT/ML [id number]    INJECT 13 UNITS UNDER THE SKIN THREE TIMES DAILY AS NEEDED FOR HIGH BLOOD SUGAR(ABOVE 150)   ISOSORBIDE MONONITRATE (IMDUR) 30 MG 24 HR TABLET    TAKE 1 TABLET BY MOUTH EVERY DAY   LANCETS (ONETOUCH DELICA PLUS [id number]) MISC    1 Device by Other route 3 (three) times daily. E11.42   LATANOPROST (XALATAN) 0.005 % OPHTHALMIC SOLUTION    Place 1 drop into both eyes at bedtime.   LIDOCAINE-PRILOCAINE (EMLA) CREAM    Apply 1 application topically every Monday, Wednesday, and Friday with hemodialysis.   METHOXY PEG-EPOETIN BETA (MIRCERA IJ)    Mircera   MULTIVITAMIN (RENA-VIT) TABS TABLET    Take 1 tablet by mouth daily.   NITROGLYCERIN (NITROSTAT) 0.3 MG SL TABLET    PLACE 1 TABLET UNDER THE TONGUE AS NEEDED FOR CHEST PAIN   OCTREOTIDE (SANDOSTATIN LAR) 10 MG INJECTION    Inject 10 mg into the muscle every 28 (twenty-eight) days.   ONDANSETRON (ZOFRAN) 4 MG TABLET    Take 1 tablet (4 mg total) by mouth every 8 (eight) hours as needed for nausea or vomiting.   OXYCODONE-ACETAMINOPHEN (PERCOCET/ROXICET) 5-325 MG TABLET    Take 1 tablet by mouth every 4 (four) hours as needed for severe pain.   PANTOPRAZOLE (PROTONIX) 40 MG TABLET    Take 1 tablet (40 mg total) by mouth 2 (two) times daily before a meal.   POLYETHYLENE GLYCOL (MIRALAX / GLYCOLAX) 17 G PACKET    Take 17 g by mouth daily as needed for mild constipation.   TIMOLOL (TIMOPTIC)  0.5 % OPHTHALMIC SOLUTION    Place 1 drop into both eyes in the morning.    VITAMIN D PO    Take by mouth.  Modified Medications   No medications on file  Discontinued Medications   No medications on file    Physical Exam:  Vitals:   12/21/21 0948  BP: (!) 154/70  Pulse: 82  Temp: (!) 97.1 F (36.2 C)  SpO2: 97%  Weight: 174 lb 3.2 oz (79 kg)  Height: 5\' 11"  (1.803 m)   Body mass index is 24.3 kg/m. Wt Readings from Last 3 Encounters:  12/21/21 174 lb 3.2 oz (79 kg)  10/28/21 174 lb (78.9 kg)  10/19/21 175 lb (79.4 kg)    Physical Exam Vitals and nursing note reviewed.  Constitutional:      Appearance: Normal appearance.  Cardiovascular:     Rate and Rhythm: Normal rate and regular rhythm.  Pulmonary:     Effort: Pulmonary effort is normal.     Breath sounds: Normal breath sounds.  Skin:    Comments: Inspected left upper arm where patient has shown.  There is no erythema or increased temp to suggest infection/abscess  Neurological:     General: No focal deficit present.     Mental Status: He is alert and oriented to person, place, and time.    Labs reviewed: Basic Metabolic Panel: Recent Labs    05/03/21 2135 05/04/21 0734 05/05/21 0308 05/06/21 1056 05/07/21 0406 06/23/21 1452 06/24/21 0800 07/08/21 1929 10/07/21 0000 11/03/21 0000 12/01/21 0000  NA 136 136   < > 135 134* 135 133* 131* 135* 135* 137  K 4.4 5.4*   < > 4.0 4.0 6.3* 6.6* 4.3 5.0 5.1 5.2  CL 98 100   < > 98 98 96* 98 91* 94* 94* 97*  CO2 27 25   < > 26 26 22 22 27   --   --   --   GLUCOSE 147* 117*   < > 132* 110* 158* 133* 173*  --   --   --   BUN 96* 115*   < > 70* 79* 123* 128* 51* 88* 87* 81*  CREATININE 5.98* 6.96*   < > 6.67* 7.71* 10.85* 11.14* 6.74* 9.3* 9.6* 10.4*  CALCIUM 8.0* 7.8*   < > 7.5* 7.9* 9.0 9.3 9.1 9.6   9.0 9.4 10.1  MG 2.1 2.1  --   --  1.9  --   --   --   --   --   --   PHOS 4.2 5.3*  --  5.1* 5.9*  --  5.2*  --   --   --   --    < > = values in this interval not displayed.   Liver Function Tests: Recent Labs    05/03/21 0046  05/06/21 1056 05/13/21 0844 06/23/21 1452 06/24/21 0800 10/07/21 0000 11/03/21 0000  AST 25  --  17 22  --   --   --   ALT 15  --  12 18  --   --   --   ALKPHOS 31*  --  63 60  --   --  84  BILITOT 0.7  --  0.3 0.4  --   --   --   PROT 6.0*  --  7.2 7.7  --   --   --   ALBUMIN 2.8*   < > 4.0 2.6* 2.5* 4.2  --    < > = values in  this interval not displayed.   Recent Labs    05/02/21 0743  LIPASE 87*   Recent Labs    05/05/21 1708  AMMONIA 49*   CBC: Recent Labs    02/25/21 1317 05/02/21 0743 05/03/21 0046 05/03/21 0101 06/23/21 1452 06/24/21 0800 07/08/21 1929 10/07/21 0000 11/03/21 0000 12/01/21 0000 12/08/21 0000  WBC 6.6   < > 6.5   < > 8.2 7.1 4.7 4.0 5.3 4.7  --   NEUTROABS 3,967  --  4.7  --  6.0  --   --   --   --   --   --   HGB 10.4*   < > 7.0*   < > 8.2* 7.7* 7.9* 14.4 12.4* 12.0* 12.5*  HCT 33.1*   < > 22.7*   < > 25.7* 24.1* 25.6* 45 37* 36*  --   MCV 93.2   < > 92.3   < > 97.3 94.9 97.3  --   --   --   --   PLT 198   < > 101*   < > 228 227 214 150 129* 156  --    < > = values in this interval not displayed.   Lipid Panel: Recent Labs    05/13/21 0844  CHOL 111  HDL 32*  LDLCALC 58  TRIG 111  CHOLHDL 3.5   TSH: No results for input(s): TSH in the last 8760 hours. A1C: Lab Results  Component Value Date   HGBA1C 6.9 11/03/2021     Assessment/Plan  1. Opioid use This is prescribed for his chronic back pain and leg pain.  We will be doing a urine drug screen today as per his drug contract  2. Lumbar foraminal stenosis Symptoms are controlled with scheduled doses of oxycodone  3. Chronic combined systolic (congestive) and diastolic (congestive) heart failure (HCC) Weight is stable.  No shortness of breath.  Heart failure compensated at this time   Alain Honey, MD [place] [phone]

## 2021-12-22 DIAGNOSIS — N2581 Secondary hyperparathyroidism of renal origin: Secondary | ICD-10-CM | POA: Diagnosis not present

## 2021-12-22 DIAGNOSIS — D631 Anemia in chronic kidney disease: Secondary | ICD-10-CM | POA: Diagnosis not present

## 2021-12-22 DIAGNOSIS — N186 End stage renal disease: Secondary | ICD-10-CM | POA: Diagnosis not present

## 2021-12-22 DIAGNOSIS — S51002A Unspecified open wound of left elbow, initial encounter: Secondary | ICD-10-CM | POA: Diagnosis not present

## 2021-12-22 DIAGNOSIS — Z992 Dependence on renal dialysis: Secondary | ICD-10-CM | POA: Diagnosis not present

## 2021-12-25 LAB — DRUG MONITOR, PANEL 1, W/CONF, URINE
Amphetamines: NEGATIVE ng/mL (ref <500)
Barbiturates: NEGATIVE ng/mL (ref <300)
Benzodiazepines: NEGATIVE ng/mL (ref <100)
Cocaine Metabolite: NEGATIVE ng/mL (ref <150)
Codeine: NEGATIVE ng/mL (ref <50)
Creatinine: 155.4 mg/dL (ref >=20.0)
Hydrocodone: NEGATIVE ng/mL (ref <50)
Hydromorphone: NEGATIVE ng/mL (ref <50)
Marijuana Metabolite: NEGATIVE ng/mL (ref <20)
Methadone Metabolite: NEGATIVE ng/mL (ref <100)
Morphine: NEGATIVE ng/mL (ref <50)
Norhydrocodone: NEGATIVE ng/mL (ref <50)
Noroxycodone: 89 ng/mL — ABNORMAL HIGH (ref <50)
Opiates: NEGATIVE ng/mL (ref <100)
Oxidant: NEGATIVE ug/mL (ref <200)
Oxycodone: 308 ng/mL — ABNORMAL HIGH (ref <50)
Oxycodone: POSITIVE ng/mL — AB (ref <100)
Oxymorphone: 1490 ng/mL — ABNORMAL HIGH (ref <50)
pH: 5.4 (ref 4.5–9.0)

## 2021-12-25 LAB — DM TEMPLATE

## 2021-12-27 ENCOUNTER — Other Ambulatory Visit: Payer: Self-pay | Admitting: *Deleted

## 2021-12-27 DIAGNOSIS — D631 Anemia in chronic kidney disease: Secondary | ICD-10-CM | POA: Diagnosis not present

## 2021-12-27 DIAGNOSIS — Z992 Dependence on renal dialysis: Secondary | ICD-10-CM | POA: Diagnosis not present

## 2021-12-27 DIAGNOSIS — M48061 Spinal stenosis, lumbar region without neurogenic claudication: Secondary | ICD-10-CM

## 2021-12-27 DIAGNOSIS — S51002A Unspecified open wound of left elbow, initial encounter: Secondary | ICD-10-CM | POA: Diagnosis not present

## 2021-12-27 DIAGNOSIS — N186 End stage renal disease: Secondary | ICD-10-CM | POA: Diagnosis not present

## 2021-12-27 DIAGNOSIS — N2581 Secondary hyperparathyroidism of renal origin: Secondary | ICD-10-CM | POA: Diagnosis not present

## 2021-12-27 NOTE — Telephone Encounter (Signed)
Patient called requesting refill on his Pain Medication. Stated that he only has #6 left and would like a refill before he runs out.  ? ?Epic LR: 12/07/2021 ?Contract Date: 07/20/2022 ?Pended Rx and sent to Dr. Sabra Heck for approval.  ?

## 2021-12-28 ENCOUNTER — Other Ambulatory Visit: Payer: Self-pay

## 2021-12-28 ENCOUNTER — Encounter: Payer: Self-pay | Admitting: Podiatry

## 2021-12-28 ENCOUNTER — Ambulatory Visit (INDEPENDENT_AMBULATORY_CARE_PROVIDER_SITE_OTHER): Payer: Medicare Other | Admitting: Podiatry

## 2021-12-28 DIAGNOSIS — L97411 Non-pressure chronic ulcer of right heel and midfoot limited to breakdown of skin: Secondary | ICD-10-CM

## 2021-12-28 DIAGNOSIS — B351 Tinea unguium: Secondary | ICD-10-CM | POA: Diagnosis not present

## 2021-12-28 DIAGNOSIS — E11621 Type 2 diabetes mellitus with foot ulcer: Secondary | ICD-10-CM | POA: Diagnosis not present

## 2021-12-28 DIAGNOSIS — M79675 Pain in left toe(s): Secondary | ICD-10-CM | POA: Diagnosis not present

## 2021-12-28 DIAGNOSIS — E0842 Diabetes mellitus due to underlying condition with diabetic polyneuropathy: Secondary | ICD-10-CM | POA: Diagnosis not present

## 2021-12-28 DIAGNOSIS — M79674 Pain in right toe(s): Secondary | ICD-10-CM

## 2021-12-28 MED ORDER — OXYCODONE-ACETAMINOPHEN 5-325 MG PO TABS: 1.0000 | ORAL_TABLET | ORAL | 0 refills | Status: DC | PRN

## 2021-12-28 NOTE — Progress Notes (Signed)
?Subjective:  ?[name], male    DOB: 01/08/1951,   MRN: [id number] ? ?Chief Complaint  ?Patient presents with  ? Diabetic Ulcer  ? ? ?71 y.o. male presents for follow-up of  ulcer. Relates it has been doing well   Has not been wearing surgical shoe. Has been dressing daily with iodosorb. Relates he would like his nails trimmed today. Last A1c was 5.8  . Denies any other pedal complaints. Denies n/v/f/c.  ? ? ?PCP: Alain Honey MD  ?Past Medical History:  ?Diagnosis Date  ? Acute respiratory failure ([place]) 03/01/2018  ? Anemia   ? Arthritis   ? "all over; mostly knees and back" (02/28/2018)  ? Chronic combined systolic and diastolic CHF (congestive heart failure) ([place])   ? Chronic lower back pain   ? stenosis  ? Community acquired pneumonia 09/06/2013  ? COPD (chronic obstructive pulmonary disease) ([place])   ? Coronary atherosclerosis of native coronary artery   ? a. 02/2003 s/p CABG x 2 (VG->RI, VG->RPDA; b. 11/2019 PCI: LM nl, LAD 90d, D3 50, RI 100, LCX 100p, OM3 100 - fills via L->L collats from D2/dLAD, RCA 100p, VG->RPDA ok, VG->RI 95 (3.5x48 Synergy XD DES).  ? Drug abuse ([place])   ? hx; tested for cocaine as recently as 2/08. says he is not using drugs now - avoided defib. for this reason   ? ESRD (end stage renal disease) ([place])   ? Hemo M-W-F- [address]  ? Fall at home 10/2020  ? GERD (gastroesophageal reflux disease)   ? takes OTC meds as needed  ? GI bleeding   ? a. 11/2019 EGD: angiodysplastic lesions w/ bleeding s/p argon plasma/clipping/epi inj. Multiple admissions for the same.  ? Glaucoma   ? uses eye drops daily  ? Hepatitis B 1968  ? "tx'd w/isolation; caught it from toilet stools in gym"  ? History of blood transfusion 03/01/2019  ? History of colon polyps   ? benign  ? History of gout   ? takes Allopurinol daily as well as Colchicine-if needed (02/28/2018)  ? History of kidney stones   ? HTN (hypertension)   ? takes Coreg,Imdur.and Apresoline daily  ? Human immunodeficiency virus (HIV)  disease ([place]) dx'd 1995  ? on Biktarvy as of 12/2020.    ? Hyperlipidemia   ? Ischemic cardiomyopathy   ? a. 01/2019 Echo: EF 40-45%, diffuse HK, mild basal septal hypertrophy. Diast dysfxn. Nl RV size/fxn. Sev dil LA. Triv MR/TR/PR.  ? Muscle spasm   ? takes Zanaflex as needed  ? Myocardial infarction [facility]) ~ 2004/2005  ? Nocturia   ? Peripheral neuropathy   ? takes gabapentin daily  ? Pneumonia   ? "at least twice" (02/28/2018)  ? SDH (subdural hematoma)   ? Syphilis, unspecified   ? Type II diabetes mellitus ([place]) 2004  ? Lantus daily.Average fasting blood sugar 125-199  ? Wears glasses   ? Wears partial dentures   ? ? ?Objective:  ?Physical Exam: ?Vascular: DP/PT pulses 2/4 bilateral. CFT <3 seconds. Normal hair growth on digits. No edema.  ?Skin. No lacerations or abrasions bilateral feet. Nails 1-5 are thickened discolored and elongated with subungual debris.  ?Hyperkeratotic tissue noted sub right foot. Ulcer noted upon debridement measuring about 0.2 cm x0.2 cm x 0.1 cm with granular base. No erythema edema or purulence noted.  ?Musculoskeletal: MMT 5/5 bilateral lower extremities in DF, PF, Inversion and Eversion. Deceased ROM in DF of ankle joint.  ?Neurological: Sensation intact to  light touch.  ? ?Assessment:  ? ?1. Diabetic ulcer of right midfoot associated with type 2 diabetes mellitus, limited to breakdown of skin ([place])   ?2. Onychomycosis   ?3. Diabetes mellitus due to underlying condition with diabetic polyneuropathy, unspecified whether long term insulin use ([place])   ?4. Pain due to onychomycosis of toenails of both feet   ? ? ? ? ?Plan:  ?Patient was evaluated and treated and all questions answered. ?Ulcer Right fourth metatarsal plantar foot limited to breakdown of skin.  ?-Debridement as below. ?-Dressed with iodosorb, DSD. ?-Off-loading with surgical shoe. Has one at home but not wearing because relates he does not walk.  ?-No abx indicated.  ?-Nails 1-5 b/l were debrided without incident.   ?-Discussed glucose control and proper protein-rich diet.  ?-Discussed if any worsening redness, pain, fever or chills to call or may need to report to the emergency room. Patient expressed understanding.  ? ?Procedure: Excisional Debridement of Wound ?Rationale: Removal of non-viable soft tissue from the wound to promote healing.  ?Anesthesia: none ?Pre-Debridement Wound Measurements:Overlying callus  ?Post-Debridement Wound Measurements: 0.2 cm x 0.2 cm x 0.1 cm  ?Type of Debridement: Sharp Excisional ?Tissue Removed: Non-viable soft tissue ?Depth of Debridement: subcutaneous tissue. ?Technique: Sharp excisional debridement to bleeding, viable wound base.  ?Dressing: Dry, sterile, compression dressing. ?Disposition: Patient tolerated procedure well. Patient to return in 2 week for follow-up. ? ?Return in about 2 weeks (around 01/11/2022) for wound check. ? ? ? ?Lorenda Peck, DPM  ? ? ?

## 2021-12-29 DIAGNOSIS — S51002A Unspecified open wound of left elbow, initial encounter: Secondary | ICD-10-CM | POA: Diagnosis not present

## 2021-12-29 DIAGNOSIS — N2581 Secondary hyperparathyroidism of renal origin: Secondary | ICD-10-CM | POA: Diagnosis not present

## 2021-12-29 DIAGNOSIS — Z992 Dependence on renal dialysis: Secondary | ICD-10-CM | POA: Diagnosis not present

## 2021-12-29 DIAGNOSIS — N186 End stage renal disease: Secondary | ICD-10-CM | POA: Diagnosis not present

## 2021-12-29 DIAGNOSIS — D631 Anemia in chronic kidney disease: Secondary | ICD-10-CM | POA: Diagnosis not present

## 2021-12-30 ENCOUNTER — Other Ambulatory Visit: Payer: Self-pay | Admitting: Gastroenterology

## 2021-12-30 ENCOUNTER — Telehealth: Payer: Self-pay

## 2021-12-30 ENCOUNTER — Other Ambulatory Visit (HOSPITAL_COMMUNITY): Payer: Self-pay

## 2021-12-30 MED ORDER — SANDOSTATIN LAR DEPOT 10 MG IM KIT
10.0000 mg | PACK | INTRAMUSCULAR | 3 refills | Status: DC
  Filled 2021-12-30: qty 1, 28d supply, fill #0
  Filled 2022-01-26: qty 1, 28d supply, fill #1
  Filled 2022-02-24: qty 1, 28d supply, fill #2
  Filled 2022-03-24: qty 1, 28d supply, fill #3

## 2021-12-30 NOTE — Telephone Encounter (Signed)
Patient called asking if he needs to restart taking the pantoprazole. Patient states he has not been taking the medication since he was advised to stop in November. Please advise ?Jacob [name] Jacob [name] ? ?

## 2021-12-30 NOTE — Telephone Encounter (Signed)
I called and left a message on a secured voicemail for the patient advising him that he can restart the pantoprazole.  ?Japleen Tornow T Brysten Reister ? ?

## 2021-12-31 ENCOUNTER — Other Ambulatory Visit (HOSPITAL_COMMUNITY): Payer: Self-pay

## 2021-12-31 DIAGNOSIS — N2581 Secondary hyperparathyroidism of renal origin: Secondary | ICD-10-CM | POA: Diagnosis not present

## 2021-12-31 DIAGNOSIS — D631 Anemia in chronic kidney disease: Secondary | ICD-10-CM | POA: Diagnosis not present

## 2021-12-31 DIAGNOSIS — N186 End stage renal disease: Secondary | ICD-10-CM | POA: Diagnosis not present

## 2021-12-31 DIAGNOSIS — Z992 Dependence on renal dialysis: Secondary | ICD-10-CM | POA: Diagnosis not present

## 2021-12-31 DIAGNOSIS — S51002A Unspecified open wound of left elbow, initial encounter: Secondary | ICD-10-CM | POA: Diagnosis not present

## 2022-01-03 ENCOUNTER — Other Ambulatory Visit (HOSPITAL_COMMUNITY): Payer: Self-pay

## 2022-01-03 DIAGNOSIS — N186 End stage renal disease: Secondary | ICD-10-CM | POA: Diagnosis not present

## 2022-01-03 DIAGNOSIS — D631 Anemia in chronic kidney disease: Secondary | ICD-10-CM | POA: Diagnosis not present

## 2022-01-03 DIAGNOSIS — S51002A Unspecified open wound of left elbow, initial encounter: Secondary | ICD-10-CM | POA: Diagnosis not present

## 2022-01-03 DIAGNOSIS — Z992 Dependence on renal dialysis: Secondary | ICD-10-CM | POA: Diagnosis not present

## 2022-01-03 DIAGNOSIS — N2581 Secondary hyperparathyroidism of renal origin: Secondary | ICD-10-CM | POA: Diagnosis not present

## 2022-01-04 ENCOUNTER — Ambulatory Visit (HOSPITAL_COMMUNITY)
Admission: RE | Admit: 2022-01-04 | Discharge: 2022-01-04 | Disposition: A | Discharge status: Home / Self Care | Payer: Medicare Other | Source: Ambulatory Visit | Attending: Gastroenterology | Admitting: Gastroenterology

## 2022-01-04 ENCOUNTER — Other Ambulatory Visit: Payer: Self-pay

## 2022-01-04 ENCOUNTER — Other Ambulatory Visit (HOSPITAL_COMMUNITY): Payer: Self-pay

## 2022-01-04 DIAGNOSIS — Q2730 Arteriovenous malformation, site unspecified: Secondary | ICD-10-CM | POA: Insufficient documentation

## 2022-01-04 MED ORDER — OCTREOTIDE ACETATE 10 MG IM KIT
10.0000 mg | PACK | INTRAMUSCULAR | Status: DC
  Administered 2022-01-04: 10 mg via INTRAMUSCULAR
  Filled 2022-01-04: qty 1

## 2022-01-05 DIAGNOSIS — S51002A Unspecified open wound of left elbow, initial encounter: Secondary | ICD-10-CM | POA: Diagnosis not present

## 2022-01-05 DIAGNOSIS — N2581 Secondary hyperparathyroidism of renal origin: Secondary | ICD-10-CM | POA: Diagnosis not present

## 2022-01-05 DIAGNOSIS — D631 Anemia in chronic kidney disease: Secondary | ICD-10-CM | POA: Diagnosis not present

## 2022-01-05 DIAGNOSIS — N186 End stage renal disease: Secondary | ICD-10-CM | POA: Diagnosis not present

## 2022-01-05 DIAGNOSIS — Z992 Dependence on renal dialysis: Secondary | ICD-10-CM | POA: Diagnosis not present

## 2022-01-07 DIAGNOSIS — Z992 Dependence on renal dialysis: Secondary | ICD-10-CM | POA: Diagnosis not present

## 2022-01-07 DIAGNOSIS — N2581 Secondary hyperparathyroidism of renal origin: Secondary | ICD-10-CM | POA: Diagnosis not present

## 2022-01-07 DIAGNOSIS — D631 Anemia in chronic kidney disease: Secondary | ICD-10-CM | POA: Diagnosis not present

## 2022-01-07 DIAGNOSIS — N186 End stage renal disease: Secondary | ICD-10-CM | POA: Diagnosis not present

## 2022-01-07 DIAGNOSIS — S51002A Unspecified open wound of left elbow, initial encounter: Secondary | ICD-10-CM | POA: Diagnosis not present

## 2022-01-10 DIAGNOSIS — N2581 Secondary hyperparathyroidism of renal origin: Secondary | ICD-10-CM | POA: Diagnosis not present

## 2022-01-10 DIAGNOSIS — S51002A Unspecified open wound of left elbow, initial encounter: Secondary | ICD-10-CM | POA: Diagnosis not present

## 2022-01-10 DIAGNOSIS — D631 Anemia in chronic kidney disease: Secondary | ICD-10-CM | POA: Diagnosis not present

## 2022-01-10 DIAGNOSIS — Z992 Dependence on renal dialysis: Secondary | ICD-10-CM | POA: Diagnosis not present

## 2022-01-10 DIAGNOSIS — N186 End stage renal disease: Secondary | ICD-10-CM | POA: Diagnosis not present

## 2022-01-11 ENCOUNTER — Ambulatory Visit (INDEPENDENT_AMBULATORY_CARE_PROVIDER_SITE_OTHER): Payer: Medicare Other | Admitting: Podiatry

## 2022-01-11 ENCOUNTER — Other Ambulatory Visit: Payer: Self-pay

## 2022-01-11 ENCOUNTER — Encounter: Payer: Self-pay | Admitting: Podiatry

## 2022-01-11 DIAGNOSIS — L97411 Non-pressure chronic ulcer of right heel and midfoot limited to breakdown of skin: Secondary | ICD-10-CM | POA: Diagnosis not present

## 2022-01-11 DIAGNOSIS — E11621 Type 2 diabetes mellitus with foot ulcer: Secondary | ICD-10-CM

## 2022-01-11 NOTE — Progress Notes (Signed)
?Subjective:  ?[name], male    DOB: [birth year],   MRN: [id number] ? ?No chief complaint on file. ? ? ?71 y.o. male presents for follow-up of  ulcer. Relates it has been doing well   Has not been wearing surgical shoe but relates he has mostly been off his feet.[name] Has been dressing daily with iodosorb. . Last A1c was 5.8  . Denies any other pedal complaints. Denies n/v/f/c.  ? ? ?PCP: Alain Honey MD  ?Past Medical History:  ?Diagnosis Date  ? Acute respiratory failure ([place]) 03/01/2018  ? Anemia   ? Arthritis   ? "all over; mostly knees and back" (02/28/2018)  ? Chronic combined systolic and diastolic CHF (congestive heart failure) ([place])   ? Chronic lower back pain   ? stenosis  ? Community acquired pneumonia 09/06/2013  ? COPD (chronic obstructive pulmonary disease) ([place])   ? Coronary atherosclerosis of native coronary artery   ? a. 02/2003 s/p CABG x 2 (VG->RI, VG->RPDA; b. 11/2019 PCI: LM nl, LAD 90d, D3 50, RI 100, LCX 100p, OM3 100 - fills via L->L collats from D2/dLAD, RCA 100p, VG->RPDA ok, VG->RI 95 (3.5x48 Synergy XD DES).  ? Drug abuse ([place])   ? hx; tested for cocaine as recently as 2/08. says he is not using drugs now - avoided defib. for this reason   ? ESRD (end stage renal disease) ([place])   ? Hemo M-W-F- [address]  ? Fall at home 10/2020  ? GERD (gastroesophageal reflux disease)   ? takes OTC meds as needed  ? GI bleeding   ? a. 11/2019 EGD: angiodysplastic lesions w/ bleeding s/p argon plasma/clipping/epi inj. Multiple admissions for the same.  ? Glaucoma   ? uses eye drops daily  ? Hepatitis B 1968  ? "tx'd w/isolation; caught it from toilet stools in gym"  ? History of blood transfusion 03/01/2019  ? History of colon polyps   ? benign  ? History of gout   ? takes Allopurinol daily as well as Colchicine-if needed (02/28/2018)  ? History of kidney stones   ? HTN (hypertension)   ? takes Coreg,Imdur.and Apresoline daily  ? Human immunodeficiency virus (HIV) disease ([place]) dx'd 1995  ? on  Biktarvy as of 12/2020.    ? Hyperlipidemia   ? Ischemic cardiomyopathy   ? a. 01/2019 Echo: EF 40-45%, diffuse HK, mild basal septal hypertrophy. Diast dysfxn. Nl RV size/fxn. Sev dil LA. Triv MR/TR/PR.  ? Muscle spasm   ? takes Zanaflex as needed  ? Myocardial infarction [facility]) ~ 2004/2005  ? Nocturia   ? Peripheral neuropathy   ? takes gabapentin daily  ? Pneumonia   ? "at least twice" (02/28/2018)  ? SDH (subdural hematoma)   ? Syphilis, unspecified   ? Type II diabetes mellitus ([place]) 2004  ? Lantus daily.Average fasting blood sugar 125-199  ? Wears glasses   ? Wears partial dentures   ? ? ?Objective:  ?Physical Exam: ?Vascular: DP/PT pulses 2/4 bilateral. CFT <3 seconds. Normal hair growth on digits. No edema.  ?Skin. No lacerations or abrasions bilateral feet. Nails 1-5 are thickened discolored and elongated with subungual debris.  ?Hyperkeratotic tissue noted sub right foot. Ulcer noted upon debridement measuring about 0.2 cm x0.1 cm x 0.1 cm with granular base. No erythema edema or purulence noted.  ?Musculoskeletal: MMT 5/5 bilateral lower extremities in DF, PF, Inversion and Eversion. Deceased ROM in DF of ankle joint.  ?Neurological: Sensation intact to light touch.  ? ?  Assessment:  ? ?1. Diabetic ulcer of right midfoot associated with type 2 diabetes mellitus, limited to breakdown of skin ([place])   ? ? ? ? ? ?Plan:  ?Patient was evaluated and treated and all questions answered. ?Ulcer Right fourth metatarsal plantar foot limited to breakdown of skin.  ?-Debridement as below. ?-Dressed with iodosorb, DSD. ?-Has been in slippers relates he is mostly sedentary at home. Did discuss pressure on this area will slow down healing process.  ?-No abx indicated.  ?-Discussed glucose control and proper protein-rich diet.  ?-Discussed if any worsening redness, pain, fever or chills to call or may need to report to the emergency room. Patient expressed understanding.  ? ?Procedure: Excisional Debridement of Wound ?Rationale:  Removal of non-viable soft tissue from the wound to promote healing.  ?Anesthesia: none ?Pre-Debridement Wound Measurements:Overlying callus  ?Post-Debridement Wound Measurements: 0.2 cm x 0.1 cm x 0.1 cm  ?Type of Debridement: Sharp Excisional ?Tissue Removed: Non-viable soft tissue ?Depth of Debridement: subcutaneous tissue. ?Technique: Sharp excisional debridement to bleeding, viable wound base.  ?Dressing: Dry, sterile, compression dressing. ?Disposition: Patient tolerated procedure well. Patient to return in 2 week for follow-up. ? ?No follow-ups on file. ? ? ? ?Lorenda Peck, DPM  ? ? ?

## 2022-01-12 DIAGNOSIS — N186 End stage renal disease: Secondary | ICD-10-CM | POA: Diagnosis not present

## 2022-01-12 DIAGNOSIS — D631 Anemia in chronic kidney disease: Secondary | ICD-10-CM | POA: Diagnosis not present

## 2022-01-12 DIAGNOSIS — Z992 Dependence on renal dialysis: Secondary | ICD-10-CM | POA: Diagnosis not present

## 2022-01-12 DIAGNOSIS — S51002A Unspecified open wound of left elbow, initial encounter: Secondary | ICD-10-CM | POA: Diagnosis not present

## 2022-01-12 DIAGNOSIS — N2581 Secondary hyperparathyroidism of renal origin: Secondary | ICD-10-CM | POA: Diagnosis not present

## 2022-01-13 ENCOUNTER — Other Ambulatory Visit: Payer: Self-pay

## 2022-01-13 DIAGNOSIS — M48061 Spinal stenosis, lumbar region without neurogenic claudication: Secondary | ICD-10-CM

## 2022-01-13 NOTE — Telephone Encounter (Signed)
Patient has one pill left. ? ?Rx last filled on 12/28/2021. Treatment agreement on file from September 2023  ? ?Dr.Miller is out of office on Thursday and will return on Tuesday. Message sent to covering provider  ?

## 2022-01-13 NOTE — Telephone Encounter (Signed)
Patient returned call and aware of Jessica's response and stated rx is due however if [name] will not fill he will have to wait for Dr.Miller  ?

## 2022-01-13 NOTE — Telephone Encounter (Signed)
Jacob [name] please call patient and informed him rx denied.  ? ?[name] I would like to confirm reason for denial, according to the instructions for use ir would appear that patients request is valid.  ?

## 2022-01-13 NOTE — Telephone Encounter (Signed)
Order provided by Dr Sabra Heck, Generally given for a months supply even though he can take up to every 4 hours 30 tablets should last a month. Dr Sabra Heck can advise otherwise if indicated.  ?

## 2022-01-13 NOTE — Telephone Encounter (Signed)
Patient notified. Stated that he wants it sent to Dr. Sabra Heck. Stated that his arm has been hurting so he has had to take more.  ? ?Pended and sent to Dr. Sabra Heck.  ?

## 2022-01-14 DIAGNOSIS — N2581 Secondary hyperparathyroidism of renal origin: Secondary | ICD-10-CM | POA: Diagnosis not present

## 2022-01-14 DIAGNOSIS — N186 End stage renal disease: Secondary | ICD-10-CM | POA: Diagnosis not present

## 2022-01-14 DIAGNOSIS — D631 Anemia in chronic kidney disease: Secondary | ICD-10-CM | POA: Diagnosis not present

## 2022-01-14 DIAGNOSIS — S51002A Unspecified open wound of left elbow, initial encounter: Secondary | ICD-10-CM | POA: Diagnosis not present

## 2022-01-14 DIAGNOSIS — Z992 Dependence on renal dialysis: Secondary | ICD-10-CM | POA: Diagnosis not present

## 2022-01-14 MED FILL — Octreotide Acetate For IM Inj Kit 10 MG: INTRAMUSCULAR | Qty: 1 | Status: AC

## 2022-01-15 MED ORDER — OXYCODONE-ACETAMINOPHEN 5-325 MG PO TABS: 1.0000 | ORAL_TABLET | ORAL | 0 refills | Status: DC | PRN

## 2022-01-17 DIAGNOSIS — D631 Anemia in chronic kidney disease: Secondary | ICD-10-CM | POA: Diagnosis not present

## 2022-01-17 DIAGNOSIS — S51002A Unspecified open wound of left elbow, initial encounter: Secondary | ICD-10-CM | POA: Diagnosis not present

## 2022-01-17 DIAGNOSIS — N2581 Secondary hyperparathyroidism of renal origin: Secondary | ICD-10-CM | POA: Diagnosis not present

## 2022-01-17 DIAGNOSIS — Z992 Dependence on renal dialysis: Secondary | ICD-10-CM | POA: Diagnosis not present

## 2022-01-17 DIAGNOSIS — N186 End stage renal disease: Secondary | ICD-10-CM | POA: Diagnosis not present

## 2022-01-17 NOTE — Telephone Encounter (Signed)
Patient called and stated that the Rx was sent to wrong pharmacy. Needs it sent to [facility].  ?[facility]  ? ?Pended Rx and resent to Dr. Sabra Heck for approval.  ?

## 2022-01-17 NOTE — Addendum Note (Signed)
Addended by: Rafael Bihari A on: 01/17/2022 12:34 PM ? ? Modules accepted: Orders ? ?

## 2022-01-18 MED ORDER — OXYCODONE-ACETAMINOPHEN 5-325 MG PO TABS: 1.0000 | ORAL_TABLET | ORAL | 0 refills | Status: DC | PRN

## 2022-01-19 DIAGNOSIS — D631 Anemia in chronic kidney disease: Secondary | ICD-10-CM | POA: Diagnosis not present

## 2022-01-19 DIAGNOSIS — N2581 Secondary hyperparathyroidism of renal origin: Secondary | ICD-10-CM | POA: Diagnosis not present

## 2022-01-19 DIAGNOSIS — Z992 Dependence on renal dialysis: Secondary | ICD-10-CM | POA: Diagnosis not present

## 2022-01-19 DIAGNOSIS — N186 End stage renal disease: Secondary | ICD-10-CM | POA: Diagnosis not present

## 2022-01-21 ENCOUNTER — Telehealth: Payer: Self-pay

## 2022-01-21 ENCOUNTER — Emergency Department (HOSPITAL_COMMUNITY): Payer: Medicare Other

## 2022-01-21 ENCOUNTER — Other Ambulatory Visit: Payer: Self-pay

## 2022-01-21 ENCOUNTER — Encounter (HOSPITAL_COMMUNITY): Payer: Self-pay

## 2022-01-21 ENCOUNTER — Emergency Department (HOSPITAL_COMMUNITY)
Admission: EM | Admit: 2022-01-21 | Discharge: 2022-01-21 | Disposition: A | Discharge status: Home / Self Care | Payer: Medicare Other | Attending: Emergency Medicine | Admitting: Emergency Medicine

## 2022-01-21 DIAGNOSIS — I129 Hypertensive chronic kidney disease with stage 1 through stage 4 chronic kidney disease, or unspecified chronic kidney disease: Secondary | ICD-10-CM | POA: Diagnosis not present

## 2022-01-21 DIAGNOSIS — N2581 Secondary hyperparathyroidism of renal origin: Secondary | ICD-10-CM | POA: Diagnosis not present

## 2022-01-21 DIAGNOSIS — Z992 Dependence on renal dialysis: Secondary | ICD-10-CM | POA: Diagnosis not present

## 2022-01-21 DIAGNOSIS — E1122 Type 2 diabetes mellitus with diabetic chronic kidney disease: Secondary | ICD-10-CM | POA: Insufficient documentation

## 2022-01-21 DIAGNOSIS — I517 Cardiomegaly: Secondary | ICD-10-CM | POA: Diagnosis not present

## 2022-01-21 DIAGNOSIS — I2510 Atherosclerotic heart disease of native coronary artery without angina pectoris: Secondary | ICD-10-CM | POA: Insufficient documentation

## 2022-01-21 DIAGNOSIS — I132 Hypertensive heart and chronic kidney disease with heart failure and with stage 5 chronic kidney disease, or end stage renal disease: Secondary | ICD-10-CM | POA: Diagnosis not present

## 2022-01-21 DIAGNOSIS — Z955 Presence of coronary angioplasty implant and graft: Secondary | ICD-10-CM | POA: Diagnosis not present

## 2022-01-21 DIAGNOSIS — R1032 Left lower quadrant pain: Secondary | ICD-10-CM | POA: Diagnosis present

## 2022-01-21 DIAGNOSIS — Z743 Need for continuous supervision: Secondary | ICD-10-CM | POA: Diagnosis not present

## 2022-01-21 DIAGNOSIS — N186 End stage renal disease: Secondary | ICD-10-CM | POA: Diagnosis not present

## 2022-01-21 DIAGNOSIS — K529 Noninfective gastroenteritis and colitis, unspecified: Secondary | ICD-10-CM | POA: Diagnosis not present

## 2022-01-21 DIAGNOSIS — R109 Unspecified abdominal pain: Secondary | ICD-10-CM | POA: Diagnosis not present

## 2022-01-21 DIAGNOSIS — R609 Edema, unspecified: Secondary | ICD-10-CM | POA: Diagnosis not present

## 2022-01-21 DIAGNOSIS — D631 Anemia in chronic kidney disease: Secondary | ICD-10-CM | POA: Diagnosis not present

## 2022-01-21 DIAGNOSIS — R1084 Generalized abdominal pain: Secondary | ICD-10-CM

## 2022-01-21 DIAGNOSIS — F17210 Nicotine dependence, cigarettes, uncomplicated: Secondary | ICD-10-CM | POA: Diagnosis not present

## 2022-01-21 DIAGNOSIS — I5042 Chronic combined systolic (congestive) and diastolic (congestive) heart failure: Secondary | ICD-10-CM | POA: Diagnosis not present

## 2022-01-21 DIAGNOSIS — J449 Chronic obstructive pulmonary disease, unspecified: Secondary | ICD-10-CM | POA: Diagnosis not present

## 2022-01-21 DIAGNOSIS — R0602 Shortness of breath: Secondary | ICD-10-CM | POA: Diagnosis not present

## 2022-01-21 DIAGNOSIS — R6889 Other general symptoms and signs: Secondary | ICD-10-CM | POA: Diagnosis not present

## 2022-01-21 DIAGNOSIS — I10 Essential (primary) hypertension: Secondary | ICD-10-CM | POA: Diagnosis not present

## 2022-01-21 LAB — PROTIME-INR
INR: 1.1 (ref 0.8–1.2)
Prothrombin Time: 14 seconds (ref 11.4–15.2)

## 2022-01-21 LAB — I-STAT CHEM 8, ED
BUN: 55 mg/dL — ABNORMAL HIGH (ref 8–23)
Calcium, Ion: 0.98 mmol/L — ABNORMAL LOW (ref 1.15–1.40)
Chloride: 97 mmol/L — ABNORMAL LOW (ref 98–111)
Creatinine, Ser: 11.2 mg/dL — ABNORMAL HIGH (ref 0.61–1.24)
Glucose, Bld: 195 mg/dL — ABNORMAL HIGH (ref 70–99)
HCT: 29 % — ABNORMAL LOW (ref 39.0–52.0)
Hemoglobin: 9.9 g/dL — ABNORMAL LOW (ref 13.0–17.0)
Potassium: 5.2 mmol/L — ABNORMAL HIGH (ref 3.5–5.1)
Sodium: 135 mmol/L (ref 135–145)
TCO2: 30 mmol/L (ref 22–32)

## 2022-01-21 LAB — CBC
HCT: 29.1 % — ABNORMAL LOW (ref 39.0–52.0)
Hemoglobin: 9.2 g/dL — ABNORMAL LOW (ref 13.0–17.0)
MCH: 34.7 pg — ABNORMAL HIGH (ref 26.0–34.0)
MCHC: 31.6 g/dL (ref 30.0–36.0)
MCV: 109.8 fL — ABNORMAL HIGH (ref 80.0–100.0)
Platelets: 148 10*3/uL — ABNORMAL LOW (ref 150–400)
RBC: 2.65 MIL/uL — ABNORMAL LOW (ref 4.22–5.81)
RDW: 15 % (ref 11.5–15.5)
WBC: 6.3 10*3/uL (ref 4.0–10.5)
nRBC: 0 % (ref 0.0–0.2)

## 2022-01-21 LAB — COMPREHENSIVE METABOLIC PANEL
ALT: 11 U/L (ref 0–44)
AST: 16 U/L (ref 15–41)
Albumin: 3.2 g/dL — ABNORMAL LOW (ref 3.5–5.0)
Alkaline Phosphatase: 55 U/L (ref 38–126)
Anion gap: 16 — ABNORMAL HIGH (ref 5–15)
BUN: 62 mg/dL — ABNORMAL HIGH (ref 8–23)
CO2: 27 mmol/L (ref 22–32)
Calcium: 9.1 mg/dL (ref 8.9–10.3)
Chloride: 91 mmol/L — ABNORMAL LOW (ref 98–111)
Creatinine, Ser: 10.54 mg/dL — ABNORMAL HIGH (ref 0.61–1.24)
GFR, Estimated: 5 mL/min — ABNORMAL LOW (ref >60)
Glucose, Bld: 194 mg/dL — ABNORMAL HIGH (ref 70–99)
Potassium: 5.2 mmol/L — ABNORMAL HIGH (ref 3.5–5.1)
Sodium: 134 mmol/L — ABNORMAL LOW (ref 135–145)
Total Bilirubin: 0.6 mg/dL (ref 0.3–1.2)
Total Protein: 7.1 g/dL (ref 6.5–8.1)

## 2022-01-21 LAB — LIPASE, BLOOD: Lipase: 58 U/L — ABNORMAL HIGH (ref 11–51)

## 2022-01-21 LAB — TROPONIN I (HIGH SENSITIVITY): Troponin I (High Sensitivity): 47 ng/L — ABNORMAL HIGH (ref <18)

## 2022-01-21 LAB — LACTIC ACID, PLASMA: Lactic Acid, Venous: 1.6 mmol/L (ref 0.5–1.9)

## 2022-01-21 MED ORDER — IOHEXOL 300 MG/ML  SOLN
100.0000 mL | Freq: Once | INTRAMUSCULAR | Status: AC | PRN
  Administered 2022-01-21: 100 mL via INTRAVENOUS

## 2022-01-21 MED ORDER — HYDROMORPHONE HCL 1 MG/ML IJ SOLN
1.0000 mg | Freq: Once | INTRAMUSCULAR | Status: AC
  Administered 2022-01-21: 1 mg via INTRAVENOUS
  Filled 2022-01-21: qty 1

## 2022-01-21 NOTE — Progress Notes (Signed)
IVT consulted at 0737 for difficult start.  IVT arrived at [facility], pt being transported to CT.  Primary RN to place new consult if needed when arrived back from CT. ?

## 2022-01-21 NOTE — ED Provider Notes (Signed)
Sign out note ? ?71 y/o male ESRD on dialysis presenting for abd pain. Did not get dialysis session this am due to their concern for his pain. Labs, CT ordered. No results yet.  ? ?Labs noted borderline hyperK, elevated Cr. No leukocytosis. CT with possible mild enteritis. Reassessed patient, symptoms well controlled, declines any additional pain medicine. Abd soft on my exam. Suspect most likely viral in nature. I discussed with renal navigator and TOC. Patient will be dc from ER and go directly to his dialysis session that he missed this am. Advised f/u w/ his regular doctors.  ?  ?Lucrezia Starch, MD ?01/21/22 2209 ? ?

## 2022-01-21 NOTE — ED Provider Notes (Signed)
?[place] DEPT ?[facility] Emergency Department ?Provider Note ?MRN:  [id number]  ?Arrival date & time: 01/22/22    ? ?Chief Complaint   ?Abdominal Pain ?  ?History of Present Illness   ?Jacob [name] is a 71 y.o. year-old male with a history of ESRD presenting to the ED with chief complaint of abdominal pain. ? ?Diffuse abdominal pain worse in the left lower quadrant present for the past several hours.  Sent away from dialysis this morning because he was having so much pain.  No fever, no vomiting, no diarrhea.  Also having some chronic pain to the left arm. ? ?Review of Systems  ?A thorough review of systems was obtained and all systems are negative except as noted in the HPI and PMH.  ? ?Patient's Health History   ? ?Past Medical History:  ?Diagnosis Date  ? Acute respiratory failure ([place]) 03/01/2018  ? Anemia   ? Arthritis   ? "all over; mostly knees and back" (02/28/2018)  ? Chronic combined systolic and diastolic CHF (congestive heart failure) ([place])   ? Chronic lower back pain   ? stenosis  ? Community acquired pneumonia 09/06/2013  ? COPD (chronic obstructive pulmonary disease) ([place])   ? Coronary atherosclerosis of native coronary artery   ? a. 02/2003 s/p CABG x 2 (VG->RI, VG->RPDA; b. 11/2019 PCI: LM nl, LAD 90d, D3 50, RI 100, LCX 100p, OM3 100 - fills via L->L collats from D2/dLAD, RCA 100p, VG->RPDA ok, VG->RI 95 (3.5x48 Synergy XD DES).  ? Drug abuse ([place])   ? hx; tested for cocaine as recently as 2/08. says he is not using drugs now - avoided defib. for this reason   ? ESRD (end stage renal disease) ([place])   ? Hemo M-W-F- [address]  ? Fall at home 10/2020  ? GERD (gastroesophageal reflux disease)   ? takes OTC meds as needed  ? GI bleeding   ? a. 11/2019 EGD: angiodysplastic lesions w/ bleeding s/p argon plasma/clipping/epi inj. Multiple admissions for the same.  ? Glaucoma   ? uses eye drops daily  ? Hepatitis B 1968  ? "tx'd w/isolation; caught it from toilet stools in gym"  ? History of blood  transfusion 03/01/2019  ? History of colon polyps   ? benign  ? History of gout   ? takes Allopurinol daily as well as Colchicine-if needed (02/28/2018)  ? History of kidney stones   ? HTN (hypertension)   ? takes Coreg,Imdur.and Apresoline daily  ? Human immunodeficiency virus (HIV) disease ([place]) dx'd 1995  ? on Biktarvy as of 12/2020.    ? Hyperlipidemia   ? Ischemic cardiomyopathy   ? a. 01/2019 Echo: EF 40-45%, diffuse HK, mild basal septal hypertrophy. Diast dysfxn. Nl RV size/fxn. Sev dil LA. Triv MR/TR/PR.  ? Muscle spasm   ? takes Zanaflex as needed  ? Myocardial infarction [facility]) ~ 2004/2005  ? Nocturia   ? Peripheral neuropathy   ? takes gabapentin daily  ? Pneumonia   ? "at least twice" (02/28/2018)  ? SDH (subdural hematoma)   ? Syphilis, unspecified   ? Type II diabetes mellitus ([place]) 2004  ? Lantus daily.Average fasting blood sugar 125-199  ? Wears glasses   ? Wears partial dentures   ?  ?Past Surgical History:  ?Procedure Laterality Date  ? AV FISTULA PLACEMENT Left 08/02/2018  ? Procedure: ARTERIOVENOUS (AV) FISTULA CREATION  left arm radiocephlic;  Surgeon: Marty Heck, MD;  Location: [place];  Service: Vascular;  Laterality: Left;  ?  AV FISTULA PLACEMENT Left 08/01/2019  ? Procedure: LEFT BRACHIOCEPHALIC ARTERIOVENOUS (AV) FISTULA CREATION;  Surgeon: [name], MD;  Location: [employer];  Service: Vascular;  Laterality: Left;  ? BASCILIC VEIN TRANSPOSITION Left 10/03/2019  ? Procedure: BASILIC VEIN TRANSPOSITION LEFT SECOND STAGE;  Surgeon: [name], MD;  Location: [place];  Service: Vascular;  Laterality: Left;  ? BIOPSY  01/25/2021  ? Procedure: BIOPSY;  Surgeon: Doran Stabler, MD;  Location: [facility] ENDOSCOPY;  Service: Gastroenterology;;  ? CARDIAC CATHETERIZATION  10/2002; 12/19/2004  ? [name] 03/08/2011  ? COLONOSCOPY  2013  ? Dr.John Henrene Pastor   ? CORONARY ARTERY BYPASS GRAFT  02/24/2003  ? CABG X2/notes 03/08/2011  ? CORONARY STENT INTERVENTION N/A 12/19/2019  ? Procedure: CORONARY STENT  INTERVENTION;  Surgeon: Jettie Booze, MD;  Location: [place] CV LAB;  Service: Cardiovascular;  Laterality: N/A;  ? ENTEROSCOPY N/A 01/25/2021  ? Procedure: ENTEROSCOPY;  Surgeon: Doran Stabler, MD;  Location: [place];  Service: Gastroenterology;  Laterality: N/A;  ? ENTEROSCOPY N/A 02/13/2021  ? Procedure: ENTEROSCOPY;  Surgeon: Jackquline Denmark, MD;  Location: [facility] ENDOSCOPY;  Service: Endoscopy;  Laterality: N/A;  ? ENTEROSCOPY N/A 05/07/2021  ? Procedure: ENTEROSCOPY;  Surgeon: Yetta Flock, MD;  Location: [facility] ENDOSCOPY;  Service: Gastroenterology;  Laterality: N/A;  ? ESOPHAGOGASTRODUODENOSCOPY (EGD) WITH PROPOFOL N/A 02/08/2019  ? Procedure: ESOPHAGOGASTRODUODENOSCOPY (EGD) WITH PROPOFOL;  Surgeon: Milus Banister, MD;  Location: [facility] ENDOSCOPY;  Service: Gastroenterology;  Laterality: N/A;  ? ESOPHAGOGASTRODUODENOSCOPY (EGD) WITH PROPOFOL N/A 12/22/2019  ? Procedure: ESOPHAGOGASTRODUODENOSCOPY (EGD) WITH PROPOFOL;  Surgeon: Lavena Bullion, DO;  Location: [place] ENDOSCOPY;  Service: Gastroenterology;  Laterality: N/A;  ? ESOPHAGOGASTRODUODENOSCOPY (EGD) WITH PROPOFOL N/A 10/19/2020  ? Procedure: ESOPHAGOGASTRODUODENOSCOPY (EGD) WITH PROPOFOL;  Surgeon: Jackquline Denmark, MD;  Location: [facility] ENDOSCOPY;  Service: Endoscopy;  Laterality: N/A;  ? ESOPHAGOGASTRODUODENOSCOPY (EGD) WITH PROPOFOL N/A 12/22/2020  ? Procedure: ESOPHAGOGASTRODUODENOSCOPY (EGD) WITH PROPOFOL;  Surgeon: Gatha Mayer, MD;  Location: [facility] ENDOSCOPY;  Service: Endoscopy;  Laterality: N/A;  ? ESOPHAGOGASTRODUODENOSCOPY (EGD) WITH PROPOFOL N/A 01/09/2021  ? Procedure: ESOPHAGOGASTRODUODENOSCOPY (EGD) WITH PROPOFOL;  Surgeon: Irene Shipper, MD;  Location: [facility] ENDOSCOPY;  Service: Endoscopy;  Laterality: N/A;  ? HEMOSTASIS CLIP PLACEMENT  12/22/2019  ? Procedure: HEMOSTASIS CLIP PLACEMENT;  Surgeon: Lavena Bullion, DO;  Location: [place];  Service: Gastroenterology;;  ? HEMOSTASIS CLIP PLACEMENT  12/22/2020  ? Procedure: HEMOSTASIS  CLIP PLACEMENT;  Surgeon: Gatha Mayer, MD;  Location: [facility] ENDOSCOPY;  Service: Endoscopy;;  ? HEMOSTASIS CONTROL  12/22/2020  ? Procedure: HEMOSTASIS CONTROL/hemospray;  Surgeon: Gatha Mayer, MD;  Location: [facility] ENDOSCOPY;  Service: Endoscopy;;  ? HOT HEMOSTASIS N/A 02/08/2019  ? Procedure: HOT HEMOSTASIS (ARGON PLASMA COAGULATION/BICAP);  Surgeon: Milus Banister, MD;  Location: [facility] ENDOSCOPY;  Service: Gastroenterology;  Laterality: N/A;  ? HOT HEMOSTASIS N/A 12/22/2019  ? Procedure: HOT HEMOSTASIS (ARGON PLASMA COAGULATION/BICAP);  Surgeon: Lavena Bullion, DO;  Location: [facility] ENDOSCOPY;  Service: Gastroenterology;  Laterality: N/A;  ? HOT HEMOSTASIS N/A 10/19/2020  ? Procedure: HOT HEMOSTASIS (ARGON PLASMA COAGULATION/BICAP);  Surgeon: Jackquline Denmark, MD;  Location: [facility] ENDOSCOPY;  Service: Endoscopy;  Laterality: N/A;  ? HOT HEMOSTASIS N/A 12/22/2020  ? Procedure: HOT HEMOSTASIS (ARGON PLASMA COAGULATION/BICAP);  Surgeon: Gatha Mayer, MD;  Location: [facility] ENDOSCOPY;  Service: Endoscopy;  Laterality: N/A;  ? HOT HEMOSTASIS N/A 01/09/2021  ? Procedure: HOT HEMOSTASIS (ARGON PLASMA COAGULATION/BICAP);  Surgeon: Irene Shipper, MD;  Location: [facility] ENDOSCOPY;  Service: Endoscopy;  Laterality: N/A;  ? HOT HEMOSTASIS N/A 01/25/2021  ? Procedure: HOT HEMOSTASIS (ARGON PLASMA COAGULATION/BICAP);  Surgeon: Doran Stabler, MD;  Location: [place];  Service: Gastroenterology;  Laterality: N/A;  ? HOT HEMOSTASIS N/A 02/13/2021  ? Procedure: HOT HEMOSTASIS (ARGON PLASMA COAGULATION/BICAP);  Surgeon: Jackquline Denmark, MD;  Location: [facility] ENDOSCOPY;  Service: Endoscopy;  Laterality: N/A;  ? HOT HEMOSTASIS N/A 05/07/2021  ? Procedure: HOT HEMOSTASIS (ARGON PLASMA COAGULATION/BICAP);  Surgeon: Yetta Flock, MD;  Location: [facility] ENDOSCOPY;  Service: Gastroenterology;  Laterality: N/A;  ? INTERTROCHANTERIC HIP FRACTURE SURGERY Left 11/2006  ? [name] 03/08/2011  ? INTRAVASCULAR ULTRASOUND/IVUS N/A 12/19/2019  ? Procedure:  Intravascular Ultrasound/IVUS;  Surgeon: Jettie Booze, MD;  Location: [place] CV LAB;  Service: Cardiovascular;  Laterality: N/A;  ? IR FLUORO GUIDE CV LINE RIGHT  07/24/2019  ? IR FLUORO GUIDE CV LINE RIGHT  10/6

## 2022-01-21 NOTE — Telephone Encounter (Signed)
Patient called stating since restarting the pantoprazole he has been having a lot of constipation. Patient reports he has taken OTC medication for the constipation and has some relief. Patient is asking should he stop the pantoprazole.  ?

## 2022-01-21 NOTE — Progress Notes (Signed)
Contacted by ED provider regarding pt's need for HD today or tomorrow. Pt receives out-pt HD at [facility] on MWF. Spoke to [place] at [facility] who states that clinic can treat pt today as long as pt arrives as soon as possible (due to staffing). Spoke to pt via phone who is agreeable to hospital providing cab to clinic and then pt will arrange for someone to pick pt up after HD and take pt home. Info provided to ED provider, RN, and ED [facility] staff. Pt to receive HD at out-pt clinic after d/c from ED.  ? ?Melven Sartorius ?Renal Navigator ?[phone] ?

## 2022-01-21 NOTE — ED Triage Notes (Signed)
Pt BIB [facility] from dialysis center c/o abdominal pain and swelling. Pt tender and guarding LLQ. Last full dialysis Monday and only one hour on Wednesday d/t "being sick". Pt A&Ox4 ?

## 2022-01-21 NOTE — Discharge Instructions (Signed)
Follow-up with your primary doctor, your kidney doctor, your vascular doctor. ? ?Recommend taking Tylenol as needed for pain.  Come back to ER if you develop uncontrolled abdominal pain, vomiting, fever or other new concerning symptom. ? ?You must get dialysis today.  Please go directly to the dialysis center.  They are expecting you. ?

## 2022-01-23 ENCOUNTER — Other Ambulatory Visit: Payer: Self-pay

## 2022-01-23 DIAGNOSIS — N186 End stage renal disease: Secondary | ICD-10-CM

## 2022-01-24 NOTE — Telephone Encounter (Signed)
Patient advised to stop pantoprazole per Dr. [name]. Patient verbalized understanding  ?

## 2022-01-25 ENCOUNTER — Ambulatory Visit (INDEPENDENT_AMBULATORY_CARE_PROVIDER_SITE_OTHER): Payer: Medicare Other | Admitting: Podiatry

## 2022-01-25 ENCOUNTER — Ambulatory Visit: Payer: Medicare Other

## 2022-01-25 ENCOUNTER — Encounter: Payer: Self-pay | Admitting: Podiatry

## 2022-01-25 DIAGNOSIS — M2012 Hallux valgus (acquired), left foot: Secondary | ICD-10-CM

## 2022-01-25 DIAGNOSIS — M2041 Other hammer toe(s) (acquired), right foot: Secondary | ICD-10-CM

## 2022-01-25 DIAGNOSIS — E0842 Diabetes mellitus due to underlying condition with diabetic polyneuropathy: Secondary | ICD-10-CM | POA: Diagnosis not present

## 2022-01-25 DIAGNOSIS — L84 Corns and callosities: Secondary | ICD-10-CM | POA: Diagnosis not present

## 2022-01-25 DIAGNOSIS — E11621 Type 2 diabetes mellitus with foot ulcer: Secondary | ICD-10-CM

## 2022-01-25 DIAGNOSIS — E1142 Type 2 diabetes mellitus with diabetic polyneuropathy: Secondary | ICD-10-CM

## 2022-01-25 NOTE — Progress Notes (Signed)
?Subjective:  ?[name], male    DOB: [birth year],   MRN: [id number] ? ?Chief Complaint  ?Patient presents with  ? Wound Check  ?  2 WEEK F/U DIABETIC ULCER/  ? ? ?71 y.o. male presents for follow-up of  ulcer. Relates it has been doing well   Has not been wearing surgical shoe but relates he has mostly been off his feet.[name] Has been dressing daily with iodosorb. . Last A1c was 5.8  . Denies any other pedal complaints. Denies n/v/f/c.  ? ? ?PCP: Alain Honey MD  ?Past Medical History:  ?Diagnosis Date  ? Acute respiratory failure ([place]) 03/01/2018  ? Anemia   ? Arthritis   ? "all over; mostly knees and back" (02/28/2018)  ? Chronic combined systolic and diastolic CHF (congestive heart failure) ([place])   ? Chronic lower back pain   ? stenosis  ? Community acquired pneumonia 09/06/2013  ? COPD (chronic obstructive pulmonary disease) ([place])   ? Coronary atherosclerosis of native coronary artery   ? a. 02/2003 s/p CABG x 2 (VG->RI, VG->RPDA; b. 11/2019 PCI: LM nl, LAD 90d, D3 50, RI 100, LCX 100p, OM3 100 - fills via L->L collats from D2/dLAD, RCA 100p, VG->RPDA ok, VG->RI 95 (3.5x48 Synergy XD DES).  ? Drug abuse ([place])   ? hx; tested for cocaine as recently as 2/08. says he is not using drugs now - avoided defib. for this reason   ? ESRD (end stage renal disease) ([name])   ? Hemo M-W-F- [address]  ? Fall at home 10/2020  ? GERD (gastroesophageal reflux disease)   ? takes OTC meds as needed  ? GI bleeding   ? a. 11/2019 EGD: angiodysplastic lesions w/ bleeding s/p argon plasma/clipping/epi inj. Multiple admissions for the same.  ? Glaucoma   ? uses eye drops daily  ? Hepatitis B 1968  ? "tx'd w/isolation; caught it from toilet stools in gym"  ? History of blood transfusion 03/01/2019  ? History of colon polyps   ? benign  ? History of gout   ? takes Allopurinol daily as well as Colchicine-if needed (02/28/2018)  ? History of kidney stones   ? HTN (hypertension)   ? takes Coreg,Imdur.and Apresoline daily  ? Human  immunodeficiency virus (HIV) disease ([place]) dx'd 1995  ? on Biktarvy as of 12/2020.    ? Hyperlipidemia   ? Ischemic cardiomyopathy   ? a. 01/2019 Echo: EF 40-45%, diffuse HK, mild basal septal hypertrophy. Diast dysfxn. Nl RV size/fxn. Sev dil LA. Triv MR/TR/PR.  ? Muscle spasm   ? takes Zanaflex as needed  ? Myocardial infarction [facility]) ~ 2004/2005  ? Nocturia   ? Peripheral neuropathy   ? takes gabapentin daily  ? Pneumonia   ? "at least twice" (02/28/2018)  ? SDH (subdural hematoma) ([facility])   ? Syphilis, unspecified   ? Type II diabetes mellitus ([place]) 2004  ? Lantus daily.Average fasting blood sugar 125-199  ? Wears glasses   ? Wears partial dentures   ? ? ?Objective:  ?Physical Exam: ?Vascular: DP/PT pulses 2/4 bilateral. CFT <3 seconds. Normal hair growth on digits. No edema.  ?Skin. No lacerations or abrasions bilateral feet. Nails 1-5 are thickened discolored and elongated with subungual debris.  ?Hyperkeratotic tissue noted sub right foot. Ulcer right foot healed.  . No erythema edema or purulence noted.  ?Musculoskeletal: MMT 5/5 bilateral lower extremities in DF, PF, Inversion and Eversion. Deceased ROM in DF of ankle joint.  ?Neurological: Sensation intact  to light touch.  ? ?Assessment:  ? ?1. Pre-ulcerative calluses   ?2. Diabetes mellitus due to underlying condition with diabetic polyneuropathy, unspecified whether long term insulin use ([place])   ? ? ? ? ? ? ?Plan:  ?Patient was evaluated and treated and all questions answered. ?Ulcer Right fourth metatarsal plantar foot limited to breakdown of skin.-Healed  ?-Hyperkeratotic tissue debrided without incident.  ?-Keep area protected for time being to prevent re-ulceration.  ?-Will get fitted for DM shoes  ?-No abx indicated.  ?-Discussed glucose control and proper protein-rich diet.  ?-Discussed if any worsening redness, pain, fever or chills to call or may need to report to the emergency room. Patient expressed understanding.  ? ? ?Return in about 4 weeks  (around 02/22/2022) for wound check. ? ? ? ?Lorenda Peck, DPM  ? ? ?

## 2022-01-25 NOTE — Progress Notes (Signed)
SITUATION ?Reason for Consult: Evaluation for Prefabricated Diabetic Shoes and Custom Diabetic Inserts. ?Patient / Caregiver Report: Patient would like well fitting shoes ? ?OBJECTIVE DATA: ?Patient History / Diagnosis:  ?  ICD-10-CM   ?1. Diabetic peripheral neuropathy associated with type 2 diabetes mellitus ([place])  E11.42   ?  ?2. Hallux valgus, acquired, bilateral  M20.11   ? M20.12   ?  ?3. Acquired hammertoes of both feet  M20.41   ? M20.42   ?  ?4. Diabetic ulcer of right midfoot associated with type 2 diabetes mellitus, with fat layer exposed ([place])  E11.621   ? B51.025   ?  ? ? ?Physician Treating Diabetes:  [name], MD ? ?Current or Previous Devices:   Historical user ? ?In-Person Foot Examination: ?Ulcers & Callousing:   Historical ulceration of right midfoot ?Deformities:    Hallux valgus, hammertoes ?Sensation:    Compromised  ?Shoe Size:     11.5XW ? ?ORTHOTIC RECOMMENDATION ?Recommended Devices: ?- 1x pair prefabricated PDAC approved diabetic shoes; Patient Selected [employer] [place] Blue 529 Size 11.5XW ?- 3x pair custom-to-patient PDAC approved vacuum formed diabetic insoles. ? ?GOALS OF SHOES AND INSOLES ?- Reduce shear and pressure ?- Reduce / Prevent callus formation ?- Reduce / Prevent ulceration ?- Protect the fragile healing compromised diabetic foot. ? ?Patient would benefit from diabetic shoes and inserts as patient has diabetes mellitus and the patient has one or more of the following conditions: ?- History of partial or complete amputation of the foot ?- History of previous foot ulceration. ?- History of pre-ulcerative callus ?- Peripheral neuropathy with evidence of callus formation ?- Foot deformity ?- Poor circulation ? ?ACTIONS PERFORMED ?Potential out of pocket cost was communicated to patient. Patient understood and consented to measurement and casting. Patient was casted for insoles via crush box and measured for shoes via brannock device. Procedure was explained and patient  tolerated procedure well. All questions were answered and concerns addressed. Casts were shipped to central fabrication for HOLD until Certificate of Medical Necessity or otherwise necessary authorization from insurance is obtained. ? ?PLAN ?Shoes are to be ordered and casts released from hold once all appropriate paperwork is complete. Patient is to be contacted and scheduled for fitting once shoes and insoles have been fabricated and received. ? ?

## 2022-01-26 ENCOUNTER — Other Ambulatory Visit (HOSPITAL_COMMUNITY): Payer: Self-pay

## 2022-01-26 DIAGNOSIS — L299 Pruritus, unspecified: Secondary | ICD-10-CM | POA: Diagnosis not present

## 2022-01-26 DIAGNOSIS — D631 Anemia in chronic kidney disease: Secondary | ICD-10-CM | POA: Diagnosis not present

## 2022-01-26 DIAGNOSIS — N2581 Secondary hyperparathyroidism of renal origin: Secondary | ICD-10-CM | POA: Diagnosis not present

## 2022-01-26 DIAGNOSIS — Z992 Dependence on renal dialysis: Secondary | ICD-10-CM | POA: Diagnosis not present

## 2022-01-26 DIAGNOSIS — E1122 Type 2 diabetes mellitus with diabetic chronic kidney disease: Secondary | ICD-10-CM | POA: Diagnosis not present

## 2022-01-26 DIAGNOSIS — N186 End stage renal disease: Secondary | ICD-10-CM | POA: Diagnosis not present

## 2022-01-26 DIAGNOSIS — T8249XA Other complication of vascular dialysis catheter, initial encounter: Secondary | ICD-10-CM | POA: Diagnosis not present

## 2022-01-27 ENCOUNTER — Ambulatory Visit (HOSPITAL_COMMUNITY)
Admission: RE | Admit: 2022-01-27 | Discharge: 2022-01-27 | Disposition: A | Discharge status: Home / Self Care | Payer: Medicare Other | Source: Ambulatory Visit | Attending: Vascular Surgery | Admitting: Vascular Surgery

## 2022-01-27 ENCOUNTER — Ambulatory Visit (INDEPENDENT_AMBULATORY_CARE_PROVIDER_SITE_OTHER): Payer: Medicare Other | Admitting: Vascular Surgery

## 2022-01-27 ENCOUNTER — Encounter: Payer: Self-pay | Admitting: Vascular Surgery

## 2022-01-27 ENCOUNTER — Other Ambulatory Visit (HOSPITAL_COMMUNITY): Payer: Self-pay

## 2022-01-27 VITALS — BP 112/59 | HR 70 | Temp 97.3°F | Resp 20 | Ht 73.0 in | Wt 174.0 lb

## 2022-01-27 DIAGNOSIS — Z992 Dependence on renal dialysis: Secondary | ICD-10-CM

## 2022-01-27 DIAGNOSIS — N186 End stage renal disease: Secondary | ICD-10-CM | POA: Diagnosis not present

## 2022-01-27 NOTE — Progress Notes (Signed)
? ? ?REASON FOR VISIT:  ? ?Evaluate pain and numbness in the fingers of his access arm.  The consult is requested by Dr. Ryan San[place]. ? ?MEDICAL ISSUES:  ? ?END-STAGE RENAL DISEASE: This patient had a left brachiobasilic fistula placed in October 2020.  He now has wounds on 2 of his fingers of the left hand and also on his forearm.  He has evidence of significant steal both on exam and by his duplex study.  The fistula has a very weak thrill and has not been working well.  I have recommended ligation of the fistula and placement of the catheter.  We will then reassess him for access in the right arm.  He does have a strong radial pulse on the right.  Given that the fistula is not functioning well and has a weak thrill I do not think he is a candidate for banding of the fistula nor do I think he is a good candidate for a DRIL procedure.  He is scheduled for ligation of his fistula and placement of a catheter on 02/08/2022.  I will then get him in for follow-up for vein mapping in the right arm and an arterial Doppler study given his history of steal on the left. ? ? ?HPI:  ? ?Jacob [name] is a pleasant 71 y.o. male who was referred for evaluation of pain and numbness in his fingers and his access arm.  I have reviewed the records from the referring office. ?He has not been previously seen in our office. ? ?On my history, this patient had a previous left radiocephalic fistula that failed.  In October 2020 he had a left brachial basilic fistula which he has been using for dialysis on Monday Wednesdays and Fridays.  He says he underwent a procedure on 11/09/2021 by [facility].  We are trying to get those records.  He developed a wound on his forearm as documented in the photograph below and also wounds on his 2 of his fingers on the left hand.  He was sent for vascular consultation.  He does describe some pain in his hand during dialysis.  He does not have a catheter. ? ?Past Medical History:  ?Diagnosis Date  ?  Acute respiratory failure (HCC) 03/01/2018  ? Anemia   ? Arthritis   ? "all over; mostly knees and back" (02/28/2018)  ? Chronic combined systolic and diastolic CHF (congestive heart failure) (HCC)   ? Chronic lower back pain   ? stenosis  ? Community acquired pneumonia 09/06/2013  ? COPD (chronic obstructive pulmonary disease) (HCC)   ? Coronary atherosclerosis of native coronary artery   ? a. 02/2003 s/p CABG x 2 (VG->RI, VG->RPDA; b. 11/2019 PCI: LM nl, LAD 90d, D3 50, RI 100, LCX 100p, OM3 100 - fills via L->L collats from D2/dLAD, RCA 100p, VG->RPDA ok, VG->RI 95 (3.5x48 Synergy XD DES).  ? Drug abuse (HCC)   ? hx; tested for cocaine as recently as 2/08. says he is not using drugs now - avoided defib. for this reason   ? ESRD (end stage renal disease) (HCC)   ? Hemo M-W-F- [address]  ? Fall at home 10/2020  ? GERD (gastroesophageal reflux disease)   ? takes OTC meds as needed  ? GI bleeding   ? a. 11/2019 EGD: angiodysplastic lesions w/ bleeding s/p argon plasma/clipping/epi inj. Multiple admissions for the same.  ? Glaucoma   ? uses eye drops daily  ? Hepatitis B 1968  ? "tx'd   w/isolation; caught it from toilet stools in gym"  ? History of blood transfusion 03/01/2019  ? History of colon polyps   ? benign  ? History of gout   ? takes Allopurinol daily as well as Colchicine-if needed (02/28/2018)  ? History of kidney stones   ? HTN (hypertension)   ? takes Coreg,Imdur.and Apresoline daily  ? Human immunodeficiency virus (HIV) disease ([place]) dx'd 1995  ? on Biktarvy as of 12/2020.    ? Hyperlipidemia   ? Ischemic cardiomyopathy   ? a. 01/2019 Echo: EF 40-45%, diffuse HK, mild basal septal hypertrophy. Diast dysfxn. Nl RV size/fxn. Sev dil LA. Triv MR/TR/PR.  ? Muscle spasm   ? takes Zanaflex as needed  ? Myocardial infarction [facility]) ~ 2004/2005  ? Nocturia   ? Peripheral neuropathy   ? takes gabapentin daily  ? Pneumonia   ? "at least twice" (02/28/2018)  ? SDH (subdural hematoma) (HCC)   ? Syphilis, unspecified   ? Type II  diabetes mellitus ([place]) 2004  ? Lantus daily.Average fasting blood sugar 125-199  ? Wears glasses   ? Wears partial dentures   ? ? ?Family History  ?Problem Relation Age of Onset  ? Heart failure Father   ? Hypertension Father   ? Diabetes Brother   ? Heart attack Brother   ? Alzheimer's disease Mother   ? Stroke Sister   ? Diabetes Sister   ? Alzheimer's disease Sister   ? Hypertension Brother   ? Diabetes Brother   ? Drug abuse Brother   ? Colon cancer Neg Hx   ? ? ?SOCIAL HISTORY: ?Social History  ? ?Tobacco Use  ? Smoking status: Every Day  ?  Packs/day: 0.50  ?  Years: 43.00  ?  Pack years: 21.50  ?  Types: Cigarettes  ? Smokeless tobacco: Never  ?Substance Use Topics  ? Alcohol use: Not Currently  ?  Alcohol/week: 12.0 standard drinks  ?  Types: 12 Standard drinks or equivalent per week  ?  Comment: occassional  ? ? ?Allergies  ?Allergen Reactions  ? Augmentin [Amoxicillin-Pot Clavulanate] Diarrhea and Other (See Comments)  ?  Severe diarrhea  ? Mucinex Fast-Max Other (See Comments)  ?  Intense sweating   ? Amphetamines Other (See Comments)  ?  Unknown reaction  ? ? ?Current Outpatient Medications  ?Medication Sig Dispense Refill  ? acetaminophen (TYLENOL) 650 MG CR tablet Take 650 mg by mouth 3 (three) times daily.    ? allopurinol (ZYLOPRIM) 100 MG tablet TAKE 1 TABLET(100 MG) BY MOUTH DAILY (Patient taking differently: Take 100 mg by mouth daily.) 90 tablet 2  ? ANORO ELLIPTA 62.5-25 MCG/INH AEPB INHALE 1 PUFF BY MOUTH EVERY DAY (Patient taking differently: Inhale 1 puff into the lungs daily.) 60 each 5  ? B-D UF III MINI PEN NEEDLES 31G X 5 MM MISC USE FOUR TIMES DAILY 100 each 11  ? BIKTARVY 50-200-25 MG TABS tablet Take 1 tablet by mouth daily. 30 tablet 11  ? Calcium Acetate 667 MG TABS Take 667-1,334 mg by mouth See admin instructions. Take 1,334 mg by mouth three times a day with meals and 667 mg with each snack    ? carvedilol (COREG) 3.125 MG tablet TAKE 1 TABLET(3.125 MG) BY MOUTH TWICE DAILY  (Patient taking differently: Take 3.125 mg by mouth 2 (two) times daily with a meal.) 180 tablet 3  ? colchicine 0.6 MG tablet TAKE 1 TABLET BY MOUTH AS NEEDED FOR GOUT (Patient taking differently: Take 0.6  mg by mouth daily as needed (gout flare).) 30 tablet 1  ? diclofenac Sodium (VOLTAREN) 1 % GEL APPLY 2 GRAMS TOPICALLY TO THE AFFECTED AREA THREE TIMES DAILY AS NEEDED FOR PAIN (Patient taking differently: Apply 2-4 g topically 3 (three) times daily as needed (pain).) 638 g 3  ? folic acid (FOLVITE) 1 MG tablet TAKE 1 TABLET(1 MG) BY MOUTH DAILY (Patient taking differently: Take 1 mg by mouth daily.) 90 tablet 2  ? gabapentin (NEURONTIN) 300 MG capsule TAKE 1 CAPSULE BY MOUTH DAILY. DOSE CAHNGE DUE TO RENAL FUNCTION (Patient taking differently: Take 300 mg by mouth daily.) 90 capsule 2  ? glucose blood (ONETOUCH ULTRA) test strip Check blood sugar three times daily E11.40 300 strip 3  ? insulin lispro (HUMALOG) 100 UNIT/ML KwikPen INJECT 13 UNITS UNDER THE SKIN THREE TIMES DAILY AS NEEDED FOR HIGH BLOOD SUGAR(ABOVE 150) (Patient taking differently: Inject 13 Units into the skin 3 (three) times daily.) 45 mL 1  ? isosorbide mononitrate (IMDUR) 30 MG 24 hr tablet TAKE 1 TABLET BY MOUTH EVERY DAY (Patient taking differently: Take 30 mg by mouth daily.) 90 tablet 2  ? Lancets (ONETOUCH DELICA PLUS LHTDSK87G) MISC 1 Device by Other route 3 (three) times daily. E11.42 300 each 1  ? latanoprost (XALATAN) 0.005 % ophthalmic solution Place 1 drop into both eyes at bedtime.    ? lidocaine-prilocaine (EMLA) cream Apply 1 application topically every Monday, Wednesday, and Friday with hemodialysis.    ? Methoxy PEG-Epoetin Beta (MIRCERA IJ) Mircera    ? multivitamin (RENA-VIT) TABS tablet Take 1 tablet by mouth daily.    ? nitroGLYCERIN (NITROSTAT) 0.3 MG SL tablet PLACE 1 TABLET UNDER THE TONGUE AS NEEDED FOR CHEST PAIN (Patient taking differently: Place 0.3 mg under the tongue every 5 (five) minutes as needed for chest  pain.) 75 tablet 2  ? octreotide (SANDOSTATIN LAR DEPOT) 10 MG injection Inject 10 mg into the muscle every 28 days. 1 kit 3  ? ondansetron (ZOFRAN) 4 MG tablet Take 1 tablet (4 mg total) by mouth every 8 (eight) ho

## 2022-01-27 NOTE — H&P (View-Only) (Signed)
? ? ?REASON FOR VISIT:  ? ?Evaluate pain and numbness in the fingers of his access arm.  The consult is requested by Dr. Pearson Grippe. ? ?MEDICAL ISSUES:  ? ?END-STAGE RENAL DISEASE: This patient had a left brachiobasilic fistula placed in October 2020.  He now has wounds on 2 of his fingers of the left hand and also on his forearm.  He has evidence of significant steal both on exam and by his duplex study.  The fistula has a very weak thrill and has not been working well.  I have recommended ligation of the fistula and placement of the catheter.  We will then reassess him for access in the right arm.  He does have a strong radial pulse on the right.  Given that the fistula is not functioning well and has a weak thrill I do not think he is a candidate for banding of the fistula nor do I think he is a good candidate for a DRIL procedure.  He is scheduled for ligation of his fistula and placement of a catheter on 02/08/2022.  I will then get him in for follow-up for vein mapping in the right arm and an arterial Doppler study given his history of steal on the left. ? ? ?HPI:  ? ?Jacob [name] is a pleasant 71 y.o. male who was referred for evaluation of pain and numbness in his fingers and his access arm.  I have reviewed the records from the referring office. ?He has not been previously seen in our office. ? ?On my history, this patient had a previous left radiocephalic fistula that failed.  In October 2020 he had a left brachial basilic fistula which he has been using for dialysis on Monday Wednesdays and Fridays.  He says he underwent a procedure on 11/09/2021 by [facility].  We are trying to get those records.  He developed a wound on his forearm as documented in the photograph below and also wounds on his 2 of his fingers on the left hand.  He was sent for vascular consultation.  He does describe some pain in his hand during dialysis.  He does not have a catheter. ? ?Past Medical History:  ?Diagnosis Date  ?  Acute respiratory failure ([place]) 03/01/2018  ? Anemia   ? Arthritis   ? "all over; mostly knees and back" (02/28/2018)  ? Chronic combined systolic and diastolic CHF (congestive heart failure) ([place])   ? Chronic lower back pain   ? stenosis  ? Community acquired pneumonia 09/06/2013  ? COPD (chronic obstructive pulmonary disease) ([place])   ? Coronary atherosclerosis of native coronary artery   ? a. 02/2003 s/p CABG x 2 (VG->RI, VG->RPDA; b. 11/2019 PCI: LM nl, LAD 90d, D3 50, RI 100, LCX 100p, OM3 100 - fills via L->L collats from D2/dLAD, RCA 100p, VG->RPDA ok, VG->RI 95 (3.5x48 Synergy XD DES).  ? Drug abuse ([place])   ? hx; tested for cocaine as recently as 2/08. says he is not using drugs now - avoided defib. for this reason   ? ESRD (end stage renal disease) ([place])   ? Hemo M-W-F- [address]  ? Fall at home 10/2020  ? GERD (gastroesophageal reflux disease)   ? takes OTC meds as needed  ? GI bleeding   ? a. 11/2019 EGD: angiodysplastic lesions w/ bleeding s/p argon plasma/clipping/epi inj. Multiple admissions for the same.  ? Glaucoma   ? uses eye drops daily  ? Hepatitis B 1968  ? "tx'd  w/isolation; caught it from toilet stools in gym"  ? History of blood transfusion 03/01/2019  ? History of colon polyps   ? benign  ? History of gout   ? takes Allopurinol daily as well as Colchicine-if needed (02/28/2018)  ? History of kidney stones   ? HTN (hypertension)   ? takes Coreg,Imdur.and Apresoline daily  ? Human immunodeficiency virus (HIV) disease ([place]) dx'd 1995  ? on Biktarvy as of 12/2020.    ? Hyperlipidemia   ? Ischemic cardiomyopathy   ? a. 01/2019 Echo: EF 40-45%, diffuse HK, mild basal septal hypertrophy. Diast dysfxn. Nl RV size/fxn. Sev dil LA. Triv MR/TR/PR.  ? Muscle spasm   ? takes Zanaflex as needed  ? Myocardial infarction [facility]) ~ 2004/2005  ? Nocturia   ? Peripheral neuropathy   ? takes gabapentin daily  ? Pneumonia   ? "at least twice" (02/28/2018)  ? SDH (subdural hematoma) (HCC)   ? Syphilis, unspecified   ? Type II  diabetes mellitus ([place]) 2004  ? Lantus daily.Average fasting blood sugar 125-199  ? Wears glasses   ? Wears partial dentures   ? ? ?Family History  ?Problem Relation Age of Onset  ? Heart failure Father   ? Hypertension Father   ? Diabetes Brother   ? Heart attack Brother   ? Alzheimer's disease Mother   ? Stroke Sister   ? Diabetes Sister   ? Alzheimer's disease Sister   ? Hypertension Brother   ? Diabetes Brother   ? Drug abuse Brother   ? Colon cancer Neg Hx   ? ? ?SOCIAL HISTORY: ?Social History  ? ?Tobacco Use  ? Smoking status: Every Day  ?  Packs/day: 0.50  ?  Years: 43.00  ?  Pack years: 21.50  ?  Types: Cigarettes  ? Smokeless tobacco: Never  ?Substance Use Topics  ? Alcohol use: Not Currently  ?  Alcohol/week: 12.0 standard drinks  ?  Types: 12 Standard drinks or equivalent per week  ?  Comment: occassional  ? ? ?Allergies  ?Allergen Reactions  ? Augmentin [Amoxicillin-Pot Clavulanate] Diarrhea and Other (See Comments)  ?  Severe diarrhea  ? Mucinex Fast-Max Other (See Comments)  ?  Intense sweating   ? Amphetamines Other (See Comments)  ?  Unknown reaction  ? ? ?Current Outpatient Medications  ?Medication Sig Dispense Refill  ? acetaminophen (TYLENOL) 650 MG CR tablet Take 650 mg by mouth 3 (three) times daily.    ? allopurinol (ZYLOPRIM) 100 MG tablet TAKE 1 TABLET(100 MG) BY MOUTH DAILY (Patient taking differently: Take 100 mg by mouth daily.) 90 tablet 2  ? ANORO ELLIPTA 62.5-25 MCG/INH AEPB INHALE 1 PUFF BY MOUTH EVERY DAY (Patient taking differently: Inhale 1 puff into the lungs daily.) 60 each 5  ? B-D UF III MINI PEN NEEDLES 31G X 5 MM MISC USE FOUR TIMES DAILY 100 each 11  ? BIKTARVY 50-200-25 MG TABS tablet Take 1 tablet by mouth daily. 30 tablet 11  ? Calcium Acetate 667 MG TABS Take 667-1,334 mg by mouth See admin instructions. Take 1,334 mg by mouth three times a day with meals and 667 mg with each snack    ? carvedilol (COREG) 3.125 MG tablet TAKE 1 TABLET(3.125 MG) BY MOUTH TWICE DAILY  (Patient taking differently: Take 3.125 mg by mouth 2 (two) times daily with a meal.) 180 tablet 3  ? colchicine 0.6 MG tablet TAKE 1 TABLET BY MOUTH AS NEEDED FOR GOUT (Patient taking differently: Take 0.6  mg by mouth daily as needed (gout flare).) 30 tablet 1  ? diclofenac Sodium (VOLTAREN) 1 % GEL APPLY 2 GRAMS TOPICALLY TO THE AFFECTED AREA THREE TIMES DAILY AS NEEDED FOR PAIN (Patient taking differently: Apply 2-4 g topically 3 (three) times daily as needed (pain).) 638 g 3  ? folic acid (FOLVITE) 1 MG tablet TAKE 1 TABLET(1 MG) BY MOUTH DAILY (Patient taking differently: Take 1 mg by mouth daily.) 90 tablet 2  ? gabapentin (NEURONTIN) 300 MG capsule TAKE 1 CAPSULE BY MOUTH DAILY. DOSE CAHNGE DUE TO RENAL FUNCTION (Patient taking differently: Take 300 mg by mouth daily.) 90 capsule 2  ? glucose blood (ONETOUCH ULTRA) test strip Check blood sugar three times daily E11.40 300 strip 3  ? insulin lispro (HUMALOG) 100 UNIT/ML KwikPen INJECT 13 UNITS UNDER THE SKIN THREE TIMES DAILY AS NEEDED FOR HIGH BLOOD SUGAR(ABOVE 150) (Patient taking differently: Inject 13 Units into the skin 3 (three) times daily.) 45 mL 1  ? isosorbide mononitrate (IMDUR) 30 MG 24 hr tablet TAKE 1 TABLET BY MOUTH EVERY DAY (Patient taking differently: Take 30 mg by mouth daily.) 90 tablet 2  ? Lancets (ONETOUCH DELICA PLUS LHTDSK87G) MISC 1 Device by Other route 3 (three) times daily. E11.42 300 each 1  ? latanoprost (XALATAN) 0.005 % ophthalmic solution Place 1 drop into both eyes at bedtime.    ? lidocaine-prilocaine (EMLA) cream Apply 1 application topically every Monday, Wednesday, and Friday with hemodialysis.    ? Methoxy PEG-Epoetin Beta (MIRCERA IJ) Mircera    ? multivitamin (RENA-VIT) TABS tablet Take 1 tablet by mouth daily.    ? nitroGLYCERIN (NITROSTAT) 0.3 MG SL tablet PLACE 1 TABLET UNDER THE TONGUE AS NEEDED FOR CHEST PAIN (Patient taking differently: Place 0.3 mg under the tongue every 5 (five) minutes as needed for chest  pain.) 75 tablet 2  ? octreotide (SANDOSTATIN LAR DEPOT) 10 MG injection Inject 10 mg into the muscle every 28 days. 1 kit 3  ? ondansetron (ZOFRAN) 4 MG tablet Take 1 tablet (4 mg total) by mouth every 8 (eight) ho

## 2022-01-28 ENCOUNTER — Other Ambulatory Visit (HOSPITAL_COMMUNITY): Payer: Self-pay

## 2022-01-28 DIAGNOSIS — D631 Anemia in chronic kidney disease: Secondary | ICD-10-CM | POA: Diagnosis not present

## 2022-01-28 DIAGNOSIS — N2581 Secondary hyperparathyroidism of renal origin: Secondary | ICD-10-CM | POA: Diagnosis not present

## 2022-01-28 DIAGNOSIS — E1122 Type 2 diabetes mellitus with diabetic chronic kidney disease: Secondary | ICD-10-CM | POA: Diagnosis not present

## 2022-01-28 DIAGNOSIS — L299 Pruritus, unspecified: Secondary | ICD-10-CM | POA: Diagnosis not present

## 2022-01-28 DIAGNOSIS — T8249XA Other complication of vascular dialysis catheter, initial encounter: Secondary | ICD-10-CM | POA: Diagnosis not present

## 2022-01-28 DIAGNOSIS — N186 End stage renal disease: Secondary | ICD-10-CM | POA: Diagnosis not present

## 2022-01-28 DIAGNOSIS — Z992 Dependence on renal dialysis: Secondary | ICD-10-CM | POA: Diagnosis not present

## 2022-01-31 ENCOUNTER — Other Ambulatory Visit (HOSPITAL_COMMUNITY): Payer: Self-pay

## 2022-01-31 ENCOUNTER — Other Ambulatory Visit: Payer: Self-pay

## 2022-01-31 DIAGNOSIS — E1122 Type 2 diabetes mellitus with diabetic chronic kidney disease: Secondary | ICD-10-CM | POA: Diagnosis not present

## 2022-01-31 DIAGNOSIS — Z992 Dependence on renal dialysis: Secondary | ICD-10-CM | POA: Diagnosis not present

## 2022-01-31 DIAGNOSIS — T8249XA Other complication of vascular dialysis catheter, initial encounter: Secondary | ICD-10-CM | POA: Diagnosis not present

## 2022-01-31 DIAGNOSIS — L299 Pruritus, unspecified: Secondary | ICD-10-CM | POA: Diagnosis not present

## 2022-01-31 DIAGNOSIS — N2581 Secondary hyperparathyroidism of renal origin: Secondary | ICD-10-CM | POA: Diagnosis not present

## 2022-01-31 DIAGNOSIS — D631 Anemia in chronic kidney disease: Secondary | ICD-10-CM | POA: Diagnosis not present

## 2022-01-31 DIAGNOSIS — N186 End stage renal disease: Secondary | ICD-10-CM | POA: Diagnosis not present

## 2022-02-02 DIAGNOSIS — E1122 Type 2 diabetes mellitus with diabetic chronic kidney disease: Secondary | ICD-10-CM | POA: Diagnosis not present

## 2022-02-02 DIAGNOSIS — D631 Anemia in chronic kidney disease: Secondary | ICD-10-CM | POA: Diagnosis not present

## 2022-02-02 DIAGNOSIS — T8249XA Other complication of vascular dialysis catheter, initial encounter: Secondary | ICD-10-CM | POA: Diagnosis not present

## 2022-02-02 DIAGNOSIS — N2581 Secondary hyperparathyroidism of renal origin: Secondary | ICD-10-CM | POA: Diagnosis not present

## 2022-02-02 DIAGNOSIS — L299 Pruritus, unspecified: Secondary | ICD-10-CM | POA: Diagnosis not present

## 2022-02-02 DIAGNOSIS — Z992 Dependence on renal dialysis: Secondary | ICD-10-CM | POA: Diagnosis not present

## 2022-02-02 DIAGNOSIS — N186 End stage renal disease: Secondary | ICD-10-CM | POA: Diagnosis not present

## 2022-02-03 ENCOUNTER — Ambulatory Visit (HOSPITAL_COMMUNITY)
Admission: RE | Admit: 2022-02-03 | Discharge: 2022-02-03 | Disposition: A | Discharge status: Home / Self Care | Payer: Medicare Other | Source: Ambulatory Visit | Attending: Gastroenterology | Admitting: Gastroenterology

## 2022-02-03 DIAGNOSIS — Q2730 Arteriovenous malformation, site unspecified: Secondary | ICD-10-CM | POA: Diagnosis not present

## 2022-02-03 MED ORDER — OCTREOTIDE ACETATE 10 MG IM KIT
10.0000 mg | PACK | INTRAMUSCULAR | Status: DC
  Administered 2022-02-03: 10 mg via INTRAMUSCULAR
  Filled 2022-02-03: qty 1

## 2022-02-04 DIAGNOSIS — N2581 Secondary hyperparathyroidism of renal origin: Secondary | ICD-10-CM | POA: Diagnosis not present

## 2022-02-04 DIAGNOSIS — N186 End stage renal disease: Secondary | ICD-10-CM | POA: Diagnosis not present

## 2022-02-04 DIAGNOSIS — Z992 Dependence on renal dialysis: Secondary | ICD-10-CM | POA: Diagnosis not present

## 2022-02-04 DIAGNOSIS — D631 Anemia in chronic kidney disease: Secondary | ICD-10-CM | POA: Diagnosis not present

## 2022-02-04 DIAGNOSIS — E1122 Type 2 diabetes mellitus with diabetic chronic kidney disease: Secondary | ICD-10-CM | POA: Diagnosis not present

## 2022-02-04 DIAGNOSIS — L299 Pruritus, unspecified: Secondary | ICD-10-CM | POA: Diagnosis not present

## 2022-02-04 DIAGNOSIS — T8249XA Other complication of vascular dialysis catheter, initial encounter: Secondary | ICD-10-CM | POA: Diagnosis not present

## 2022-02-06 ENCOUNTER — Encounter (HOSPITAL_COMMUNITY): Payer: Self-pay | Admitting: Vascular Surgery

## 2022-02-06 ENCOUNTER — Other Ambulatory Visit: Payer: Self-pay

## 2022-02-06 NOTE — Progress Notes (Incomplete Revision)
PCP - Dr. Ammie Ferrier ? ?Cardiologist - Dr. Edmonia James ? ?EP- Denies ? ?Endocrine- Denies ? ?Pulm- Denies ? ?Chest x-ray - 01/21/22 (E) ? ?EKG - 01/21/22 (E) ? ?Stress Test - 12/13/2018 (E) ? ?ECHO - 10/18/20 (E) ? ?Cardiac Cath - 12/19/19 (E) ? ?AICD-na ?PM-na ?LOOP-na ? ?Nerve Stimulator- Denies ? ?Dialysis- M-W-F, Pt states he only can go for 2 hours due to the pain in the left arm and hand. Pt had a 2 hr session on Fri 4/14. ? ?Sleep Study - Denies ?CPAP - Denies ? ?LABS- 02/08/22: I-Stat 8 ? ?ASA- Denies ? ?ERAS- No ? ?HA1C- 11/03/21(E): 6.9 ?Fasting Blood Sugar - 85-177 ?Checks Blood Sugar ___3__ times a day ? ?Anesthesia- Yes- cardiac history ? ?Pt denies having chest pain, sob, or fever during the pre-op phone call. All instructions explained to the pt, with a verbal understanding of the material including: as of today, stop taking all Aspirin (unless instructed by your doctor) and Other Aspirin containing products, Vitamins, Fish oils, and Herbal medications. Also stop all NSAIDS i.e. Advil, Ibuprofen, Motrin, Aleve, Anaprox, Naproxen, BC, Goody Powders, and all Supplements.  ? ?WHAT DO I DO ABOUT MY DIABETES MEDICATION? ? ?If your CBG is greater than 220 mg/dL, you may take 7 units of Humalog insulin. ? ?How do I manage my blood sugar before surgery? ?Check your blood sugar the morning of your surgery when you wake up and every 2 hours until you get to the Short Stay unit. ?If your blood sugar is less than 70 mg/dL, you will need to treat for low blood sugar: ?Do not take insulin. ?Treat a low blood sugar (less than 70 mg/dL) with ? cup of clear juice (cranberry or apple), 4 glucose tablets, OR glucose gel. ?Recheck blood sugar in 15 minutes after treatment (to make sure it is greater than 70 mg/dL). If your blood sugar is not greater than 70 mg/dL on recheck, call [phone] ? for further instructions. ?Report your blood sugar to the short stay nurse when you get to Short Stay. ? ?Reviewed and Endorsed by [facility] Patient Education Committee, August 2015 ? ? Pt also instructed to wear a mask and social distance if he goes out. The opportunity to ask questions was provided.  ?

## 2022-02-06 NOTE — Progress Notes (Addendum)
PCP - Dr. Ammie Ferrier ? ?Cardiologist - Dr. Edmonia James ? ?EP- Denies ? ?Endocrine- Denies ? ?Pulm- Denies ? ?Chest x-ray - 01/21/22 (E) ? ?EKG - 01/21/22 (E) ? ?Stress Test - 12/13/2018 (E) ? ?ECHO - 10/18/20 (E) ? ?Cardiac Cath - 12/19/19 (E) ? ?AICD-na ?PM-na ?LOOP-na ? ?Nerve Stimulator- Denies ? ?Dialysis- M-W-F, Pt states he only can go for 2 hours due to the pain in the left arm and hand. Pt had a 2 hr session on Fri 4/14. ? ?Sleep Study - Denies ?CPAP - Denies ? ?LABS- 02/08/22: I-Stat 8 ? ?ASA- Denies ? ?ERAS- No ? ?HA1C- 11/03/21(E): 6.9 ?Fasting Blood Sugar - 85-177 ?Checks Blood Sugar ___3__ times a day ? ?Anesthesia- Yes- cardiac history ? ?Pt denies having chest pain, sob, or fever during the pre-op phone call. All instructions explained to the pt, with a verbal understanding of the material including: as of today, stop taking all Aspirin (unless instructed by your doctor) and Other Aspirin containing products, Vitamins, Fish oils, and Herbal medications. Also stop all NSAIDS i.e. Advil, Ibuprofen, Motrin, Aleve, Anaprox, Naproxen, BC, Goody Powders, and all Supplements.  ? ?WHAT DO I DO ABOUT MY DIABETES MEDICATION? ? ?If your CBG is greater than 220 mg/dL, you may take 7 units of Humalog insulin. ? ?How do I manage my blood sugar before surgery? ?Check your blood sugar the morning of your surgery when you wake up and every 2 hours until you get to the Short Stay unit. ?If your blood sugar is less than 70 mg/dL, you will need to treat for low blood sugar: ?Do not take insulin. ?Treat a low blood sugar (less than 70 mg/dL) with ? cup of clear juice (cranberry or apple), 4 glucose tablets, OR glucose gel. ?Recheck blood sugar in 15 minutes after treatment (to make sure it is greater than 70 mg/dL). If your blood sugar is not greater than 70 mg/dL on recheck, call [phone] ? for further instructions. ?Report your blood sugar to the short stay nurse when you get to Short Stay. ? ?Reviewed and Endorsed by [facility] Patient Education Committee, August 2015 ? ? Pt also instructed to wear a mask and social distance if he goes out. The opportunity to ask questions was provided.  ?

## 2022-02-07 ENCOUNTER — Other Ambulatory Visit: Payer: Self-pay | Admitting: *Deleted

## 2022-02-07 DIAGNOSIS — M48061 Spinal stenosis, lumbar region without neurogenic claudication: Secondary | ICD-10-CM

## 2022-02-07 DIAGNOSIS — Z992 Dependence on renal dialysis: Secondary | ICD-10-CM | POA: Diagnosis not present

## 2022-02-07 DIAGNOSIS — N186 End stage renal disease: Secondary | ICD-10-CM | POA: Diagnosis not present

## 2022-02-07 DIAGNOSIS — L299 Pruritus, unspecified: Secondary | ICD-10-CM | POA: Diagnosis not present

## 2022-02-07 DIAGNOSIS — E1122 Type 2 diabetes mellitus with diabetic chronic kidney disease: Secondary | ICD-10-CM | POA: Diagnosis not present

## 2022-02-07 DIAGNOSIS — D631 Anemia in chronic kidney disease: Secondary | ICD-10-CM | POA: Diagnosis not present

## 2022-02-07 DIAGNOSIS — T8249XA Other complication of vascular dialysis catheter, initial encounter: Secondary | ICD-10-CM | POA: Diagnosis not present

## 2022-02-07 DIAGNOSIS — N2581 Secondary hyperparathyroidism of renal origin: Secondary | ICD-10-CM | POA: Diagnosis not present

## 2022-02-07 NOTE — Anesthesia Preprocedure Evaluation (Addendum)
Anesthesia Evaluation  ?Patient identified by MRN, date of birth, ID band ?Patient awake ? ? ? ?Reviewed: ?Allergy & Precautions, NPO status , Patient's Chart, lab work & pertinent test results ? ?Airway ?Mallampati: II ? ?TM Distance: >3 FB ? ? ? ? Dental ?  ?Pulmonary ?pneumonia, COPD, Current Smoker and Patient abstained from smoking.,  ?  ?breath sounds clear to auscultation ? ? ? ? ? ? Cardiovascular ?hypertension, + CAD, + Past MI and +CHF  ? ?Rhythm:Regular Rate:Normal ? ? ?  ?Neuro/Psych ? Neuromuscular disease   ? GI/Hepatic ?GERD  ,(+) Hepatitis -  ?Endo/Other  ?diabetes ? Renal/GU ?Renal disease  ? ?  ?Musculoskeletal ? ? Abdominal ?  ?Peds ? Hematology ?  ?Anesthesia Other Findings ? ? Reproductive/Obstetrics ? ?  ? ? ? ? ? ? ? ? ? ? ? ? ? ?  ?  ? ? ? ? ? ? ? ?Anesthesia Physical ?Anesthesia Plan ? ?ASA: 3 ? ?Anesthesia Plan: General  ? ?Post-op Pain Management:   ? ?Induction: Intravenous ? ?PONV Risk Score and Plan:  ? ?Airway Management Planned: LMA ? ?Additional Equipment:  ? ?Intra-op Plan:  ? ?Post-operative Plan: Extubation in OR ? ?Informed Consent: I have reviewed the patients History and Physical, chart, labs and discussed the procedure including the risks, benefits and alternatives for the proposed anesthesia with the patient or authorized representative who has indicated his/her understanding and acceptance.  ? ? ? ?Dental advisory given ? ?Plan Discussed with: CRNA and Anesthesiologist ? ?Anesthesia Plan Comments: (PAT note by Karoline Caldwell, PA-C: ?Follows with cardiology for history of CAD s/p CABG x2 in 2004, DES to VG-RI in 11/2019, ischemic cardiomyopathy/chronic combined CHF, HTN, HLD. ?EF 40-45% by TTE 10/18/20.  Last seen by Dr. [name] 09/21/2021.  Stable from cardiac standpoint, no changes made to management.  Recommended 1 year follow-up. ? ?History of HIV, maintained on Biktarvy, followed by infectious disease. ? ?ESRD on HD Monday Wednesday Friday.   He had a left brachiocephalic fistula placed October 2020 and now has wounds with evidence of steal. ? ?History of anemia with recurrent GI bleed secondary to AVMs with recurrent hospital admissions.  Last hemoglobin 9.9 on 01/21/2022. ? ?IDDM 2, last A1c in epic 6.9 on 11/03/2021. ? ?Current smoker.  COPD, maintained on Anoro Ellipta. ? ?Pt will need DOS labs and eval.  ? ?EKG 01/21/22: Sinus rhythm. Rate 80. Right bundle branch block. Inferolateral infarct, age indeterminate. No significant change was found ? ?TTE 10/18/2020: ??1. Left ventricular ejection fraction, by estimation, is 40 to 45%. The  ?left ventricle has mildly decreased function. The left ventricle  ?demonstrates regional wall motion abnormalities (see scoring  ?diagram/findings for description). The left ventricular  ??internal cavity size was moderately dilated. There is moderate concentric  ?left ventricular hypertrophy. Left ventricular diastolic parameters are  ?consistent with Grade II diastolic dysfunction (pseudonormalization).  ?Elevated left atrial pressure.  ??2. Right ventricular systolic function is normal. The right ventricular  ?size is normal. There is normal pulmonary artery systolic pressure.  ??3. Left atrial size was moderately dilated.  ??4. The mitral valve is normal in structure. Mild mitral valve  ?regurgitation. No evidence of mitral stenosis.  ??5. The aortic valve is normal in structure. Aortic valve regurgitation is  ?not visualized. No aortic stenosis is present.  ??6. The inferior vena cava is dilated in size with >50% respiratory  ?variability, suggesting right atrial pressure of 8 mmHg.  ? ?Cath and PCI 12/19/2019: ?? Prox RCA lesion  is 100% stenosed. SVG to RCA is widely patent. ?? Dist LAD lesion is 90% stenosed. Too small and distal for PCI. ?? 3rd Diag lesion is 50% stenosed. ?? Prox Cx lesion is 100% stenosed. Left to left collaterals. ?? Ramus lesion is 100% stenosed. ?? SVG to ramus with Origin to Prox Graft  lesion is 95% stenosed. ?? A drug-eluting stent was successfully placed using a SYNERGY XD 3.50X48, postdilated to >4 mm. Optimized with IVUS. ?? Post intervention, there is a 0% residual stenosis. ?? There is moderate to severe left ventricular systolic dysfunction. ?? There is no aortic valve stenosis. ?? Hemodynamic findings consistent with moderate pulmonary hypertension. ?? ?Continue dual antiplatelet therapy for 6 months at least. ?COnsider clopidogrel longer term if there is no interaction with his HIV meds, given the diffuse nature of CAD.  ? ? ?)  ? ? ? ? ?Anesthesia Quick Evaluation ? ?

## 2022-02-07 NOTE — Telephone Encounter (Signed)
Patient called requesting refill on Pain medication.  ?Epic LR: 01/18/2022 ?Contract Date: 07/20/2021 ? ?Patient is having surgery tomorrow.  ?Pended Rx and sent to Dr. Sabra Heck for approval.  ?

## 2022-02-07 NOTE — Progress Notes (Signed)
Anesthesia Chart Review: ?Same day workup ? ?Follows with cardiology for history of CAD s/p CABG x2 in 2004, DES to VG-RI in 11/2019, ischemic cardiomyopathy/chronic combined CHF, HTN, HLD.  EF 40-45% by TTE 10/18/20.  Last seen by Dr. [name] 09/21/2021.  Stable from cardiac standpoint, no changes made to management.  Recommended 1 year follow-up. ? ?History of HIV, maintained on Biktarvy, followed by infectious disease. ? ?ESRD on HD Monday Wednesday Friday.  He had a left brachiocephalic fistula placed October 2020 and now has wounds with evidence of steal. ? ?History of anemia with recurrent GI bleed secondary to AVMs with recurrent hospital admissions.  Last hemoglobin 9.9 on 01/21/2022. ? ?IDDM 2, last A1c in epic 6.9 on 11/03/2021. ? ?Current smoker.  COPD, maintained on Anoro Ellipta. ? ?Pt will need DOS labs and eval.  ? ?EKG 01/21/22: Sinus rhythm. Rate 80. Right bundle branch block. Inferolateral infarct, age indeterminate. No significant change was found ? ?TTE 10/18/2020: ? 1. Left ventricular ejection fraction, by estimation, is 40 to 45%. The  ?left ventricle has mildly decreased function. The left ventricle  ?demonstrates regional wall motion abnormalities (see scoring  ?diagram/findings for description). The left ventricular  ? internal cavity size was moderately dilated. There is moderate concentric  ?left ventricular hypertrophy. Left ventricular diastolic parameters are  ?consistent with Grade II diastolic dysfunction (pseudonormalization).  ?Elevated left atrial pressure.  ? 2. Right ventricular systolic function is normal. The right ventricular  ?size is normal. There is normal pulmonary artery systolic pressure.  ? 3. Left atrial size was moderately dilated.  ? 4. The mitral valve is normal in structure. Mild mitral valve  ?regurgitation. No evidence of mitral stenosis.  ? 5. The aortic valve is normal in structure. Aortic valve regurgitation is  ?not visualized. No aortic stenosis is present.  ?  6. The inferior vena cava is dilated in size with >50% respiratory  ?variability, suggesting right atrial pressure of 8 mmHg.  ? ?Cath and PCI 12/19/2019: ?Prox RCA lesion is 100% stenosed. SVG to RCA is widely patent. ?Dist LAD lesion is 90% stenosed. Too small and distal for PCI. ?3rd Diag lesion is 50% stenosed. ?Prox Cx lesion is 100% stenosed. Left to left collaterals. ?Ramus lesion is 100% stenosed. ?SVG to ramus with Origin to Prox Graft lesion is 95% stenosed. ?A drug-eluting stent was successfully placed using a SYNERGY XD 3.50X48, postdilated to >4 mm. Optimized with IVUS. ?Post intervention, there is a 0% residual stenosis. ?There is moderate to severe left ventricular systolic dysfunction. ?There is no aortic valve stenosis. ?Hemodynamic findings consistent with moderate pulmonary hypertension. ?  ?Continue dual antiplatelet therapy for 6 months at least.  COnsider clopidogrel longer term if there is no interaction with his HIV meds, given the diffuse nature of CAD.  ? ? ? ?Karoline Caldwell, PA-C ?[facility]/Anesthesiology ?Phone [phone] ?02/07/2022 12:05 PM ? ?

## 2022-02-08 ENCOUNTER — Encounter (HOSPITAL_COMMUNITY): Payer: Self-pay | Admitting: Vascular Surgery

## 2022-02-08 ENCOUNTER — Encounter (HOSPITAL_COMMUNITY)
Admission: RE | Disposition: A | Discharge status: Home / Self Care | Payer: Self-pay | Source: Home / Self Care | Attending: Vascular Surgery

## 2022-02-08 ENCOUNTER — Ambulatory Visit (HOSPITAL_COMMUNITY)
Admission: RE | Admit: 2022-02-08 | Discharge: 2022-02-08 | Disposition: A | Discharge status: Home / Self Care | Payer: Medicare Other | Attending: Vascular Surgery | Admitting: Vascular Surgery

## 2022-02-08 ENCOUNTER — Ambulatory Visit (HOSPITAL_COMMUNITY): Payer: Medicare Other | Admitting: Physician Assistant

## 2022-02-08 ENCOUNTER — Ambulatory Visit (HOSPITAL_BASED_OUTPATIENT_CLINIC_OR_DEPARTMENT_OTHER): Payer: Medicare Other | Admitting: Physician Assistant

## 2022-02-08 ENCOUNTER — Ambulatory Visit (HOSPITAL_COMMUNITY): Payer: Medicare Other

## 2022-02-08 DIAGNOSIS — T82898A Other specified complication of vascular prosthetic devices, implants and grafts, initial encounter: Secondary | ICD-10-CM | POA: Insufficient documentation

## 2022-02-08 DIAGNOSIS — N186 End stage renal disease: Secondary | ICD-10-CM

## 2022-02-08 DIAGNOSIS — I252 Old myocardial infarction: Secondary | ICD-10-CM | POA: Diagnosis not present

## 2022-02-08 DIAGNOSIS — I132 Hypertensive heart and chronic kidney disease with heart failure and with stage 5 chronic kidney disease, or end stage renal disease: Secondary | ICD-10-CM | POA: Insufficient documentation

## 2022-02-08 DIAGNOSIS — E1122 Type 2 diabetes mellitus with diabetic chronic kidney disease: Secondary | ICD-10-CM | POA: Insufficient documentation

## 2022-02-08 DIAGNOSIS — Z794 Long term (current) use of insulin: Secondary | ICD-10-CM | POA: Diagnosis not present

## 2022-02-08 DIAGNOSIS — F17210 Nicotine dependence, cigarettes, uncomplicated: Secondary | ICD-10-CM

## 2022-02-08 DIAGNOSIS — I517 Cardiomegaly: Secondary | ICD-10-CM | POA: Diagnosis not present

## 2022-02-08 DIAGNOSIS — Z992 Dependence on renal dialysis: Secondary | ICD-10-CM

## 2022-02-08 DIAGNOSIS — I5042 Chronic combined systolic (congestive) and diastolic (congestive) heart failure: Secondary | ICD-10-CM | POA: Diagnosis not present

## 2022-02-08 DIAGNOSIS — Y841 Kidney dialysis as the cause of abnormal reaction of the patient, or of later complication, without mention of misadventure at the time of the procedure: Secondary | ICD-10-CM | POA: Insufficient documentation

## 2022-02-08 DIAGNOSIS — I2510 Atherosclerotic heart disease of native coronary artery without angina pectoris: Secondary | ICD-10-CM | POA: Diagnosis not present

## 2022-02-08 DIAGNOSIS — N185 Chronic kidney disease, stage 5: Secondary | ICD-10-CM | POA: Diagnosis not present

## 2022-02-08 DIAGNOSIS — I509 Heart failure, unspecified: Secondary | ICD-10-CM | POA: Diagnosis not present

## 2022-02-08 HISTORY — PX: LIGATION OF ARTERIOVENOUS  FISTULA: SHX5948

## 2022-02-08 HISTORY — PX: INSERTION OF DIALYSIS CATHETER: SHX1324

## 2022-02-08 LAB — POCT I-STAT, CHEM 8
BUN: 52 mg/dL — ABNORMAL HIGH (ref 8–23)
Calcium, Ion: 1.07 mmol/L — ABNORMAL LOW (ref 1.15–1.40)
Chloride: 99 mmol/L (ref 98–111)
Creatinine, Ser: 9.8 mg/dL — ABNORMAL HIGH (ref 0.61–1.24)
Glucose, Bld: 113 mg/dL — ABNORMAL HIGH (ref 70–99)
HCT: 36 % — ABNORMAL LOW (ref 39.0–52.0)
Hemoglobin: 12.2 g/dL — ABNORMAL LOW (ref 13.0–17.0)
Potassium: 5.2 mmol/L — ABNORMAL HIGH (ref 3.5–5.1)
Sodium: 135 mmol/L (ref 135–145)
TCO2: 32 mmol/L (ref 22–32)

## 2022-02-08 LAB — GLUCOSE, CAPILLARY
Glucose-Capillary: 126 mg/dL — ABNORMAL HIGH (ref 70–99)
Glucose-Capillary: 110 mg/dL — ABNORMAL HIGH (ref 70–99)

## 2022-02-08 SURGERY — LIGATION OF ARTERIOVENOUS  FISTULA
Anesthesia: General | Site: Chest

## 2022-02-08 MED ORDER — DEXAMETHASONE SODIUM PHOSPHATE 10 MG/ML IJ SOLN
INTRAMUSCULAR | Status: DC | PRN
  Administered 2022-02-08: 10 mg via INTRAVENOUS

## 2022-02-08 MED ORDER — HEPARIN 6000 UNIT IRRIGATION SOLUTION
Status: AC
  Filled 2022-02-08: qty 500

## 2022-02-08 MED ORDER — EPHEDRINE SULFATE-NACL 50-0.9 MG/10ML-% IV SOSY
PREFILLED_SYRINGE | INTRAVENOUS | Status: DC | PRN
  Administered 2022-02-08: 5 mg via INTRAVENOUS

## 2022-02-08 MED ORDER — PROPOFOL 10 MG/ML IV BOLUS
INTRAVENOUS | Status: AC
  Filled 2022-02-08: qty 20

## 2022-02-08 MED ORDER — CHLORHEXIDINE GLUCONATE 4 % EX LIQD: 60.0000 mL | Freq: Once | CUTANEOUS | Status: DC

## 2022-02-08 MED ORDER — CHLORHEXIDINE GLUCONATE 0.12 % MT SOLN
15.0000 mL | Freq: Once | OROMUCOSAL | Status: AC
  Administered 2022-02-08: 15 mL via OROMUCOSAL
  Filled 2022-02-08: qty 15

## 2022-02-08 MED ORDER — PHENYLEPHRINE HCL-NACL 20-0.9 MG/250ML-% IV SOLN
INTRAVENOUS | Status: DC | PRN
  Administered 2022-02-08: 25 ug/min via INTRAVENOUS

## 2022-02-08 MED ORDER — PHENYLEPHRINE 80 MCG/ML (10ML) SYRINGE FOR IV PUSH (FOR BLOOD PRESSURE SUPPORT)
PREFILLED_SYRINGE | INTRAVENOUS | Status: DC | PRN
  Administered 2022-02-08 (×2): 80 ug via INTRAVENOUS

## 2022-02-08 MED ORDER — HEPARIN SODIUM (PORCINE) 1000 UNIT/ML IJ SOLN
INTRAMUSCULAR | Status: AC
  Filled 2022-02-08: qty 10

## 2022-02-08 MED ORDER — VANCOMYCIN HCL IN DEXTROSE 1-5 GM/200ML-% IV SOLN
1000.0000 mg | INTRAVENOUS | Status: AC
  Administered 2022-02-08: 1000 mg via INTRAVENOUS
  Filled 2022-02-08: qty 200

## 2022-02-08 MED ORDER — FENTANYL CITRATE (PF) 250 MCG/5ML IJ SOLN
INTRAMUSCULAR | Status: AC
  Filled 2022-02-08: qty 5

## 2022-02-08 MED ORDER — OXYCODONE-ACETAMINOPHEN 5-325 MG PO TABS: 1.0000 | ORAL_TABLET | ORAL | 0 refills | Status: DC | PRN

## 2022-02-08 MED ORDER — FENTANYL CITRATE (PF) 250 MCG/5ML IJ SOLN
INTRAMUSCULAR | Status: DC | PRN
  Administered 2022-02-08: 25 ug via INTRAVENOUS

## 2022-02-08 MED ORDER — LIDOCAINE-EPINEPHRINE (PF) 1 %-1:200000 IJ SOLN
INTRAMUSCULAR | Status: AC
  Filled 2022-02-08: qty 30

## 2022-02-08 MED ORDER — LIDOCAINE 2% (20 MG/ML) 5 ML SYRINGE
INTRAMUSCULAR | Status: DC | PRN
  Administered 2022-02-08: 60 mg via INTRAVENOUS

## 2022-02-08 MED ORDER — INSULIN ASPART 100 UNIT/ML IJ SOLN: 0.0000 [IU] | INTRAMUSCULAR | Status: DC | PRN

## 2022-02-08 MED ORDER — 0.9 % SODIUM CHLORIDE (POUR BTL) OPTIME
TOPICAL | Status: DC | PRN
  Administered 2022-02-08: 1000 mL

## 2022-02-08 MED ORDER — ONDANSETRON HCL 4 MG/2ML IJ SOLN
INTRAMUSCULAR | Status: DC | PRN
  Administered 2022-02-08: 4 mg via INTRAVENOUS

## 2022-02-08 MED ORDER — SODIUM CHLORIDE 0.9 % IV SOLN: INTRAVENOUS | Status: DC

## 2022-02-08 MED ORDER — FENTANYL CITRATE (PF) 100 MCG/2ML IJ SOLN
25.0000 ug | INTRAMUSCULAR | Status: DC | PRN
  Administered 2022-02-08: 25 ug via INTRAVENOUS

## 2022-02-08 MED ORDER — ORAL CARE MOUTH RINSE: 15.0000 mL | Freq: Once | OROMUCOSAL | Status: AC

## 2022-02-08 MED ORDER — PROPOFOL 10 MG/ML IV BOLUS
INTRAVENOUS | Status: DC | PRN
Start: 2022-02-08 — End: 2022-02-08
  Administered 2022-02-08: 140 mg via INTRAVENOUS

## 2022-02-08 MED ORDER — FENTANYL CITRATE (PF) 100 MCG/2ML IJ SOLN
INTRAMUSCULAR | Status: AC
  Filled 2022-02-08: qty 2

## 2022-02-08 MED ORDER — HEPARIN SODIUM (PORCINE) 1000 UNIT/ML IJ SOLN
INTRAMUSCULAR | Status: DC | PRN
  Administered 2022-02-08: 3800 [IU] via INTRAVENOUS

## 2022-02-08 MED ORDER — HEPARIN 6000 UNIT IRRIGATION SOLUTION
Status: DC | PRN
  Administered 2022-02-08: 1

## 2022-02-08 SURGICAL SUPPLY — 55 items
BAG COUNTER SPONGE SURGICOUNT (BAG) ×3 IMPLANT
BAG DECANTER FOR FLEXI CONT (MISCELLANEOUS) ×3 IMPLANT
BIOPATCH RED 1 DISK 7.0 (GAUZE/BANDAGES/DRESSINGS) ×3 IMPLANT
CANISTER SUCT 3000ML PPV (MISCELLANEOUS) ×3 IMPLANT
CANNULA VESSEL 3MM 2 BLNT TIP (CANNULA) IMPLANT
CATH PALINDROME-P 19CM W/VT (CATHETERS) IMPLANT
CATH PALINDROME-P 23CM W/VT (CATHETERS) ×1 IMPLANT
CATH PALINDROME-P 28CM W/VT (CATHETERS) IMPLANT
CHLORAPREP W/TINT 26 (MISCELLANEOUS) ×3 IMPLANT
CLIP VESOCCLUDE MED 6/CT (CLIP) ×3 IMPLANT
CLIP VESOCCLUDE SM WIDE 6/CT (CLIP) ×3 IMPLANT
COVER PROBE W GEL 5X96 (DRAPES) IMPLANT
COVER SURGICAL LIGHT HANDLE (MISCELLANEOUS) ×3 IMPLANT
DERMABOND ADVANCED (GAUZE/BANDAGES/DRESSINGS) ×1
DERMABOND ADVANCED .7 DNX12 (GAUZE/BANDAGES/DRESSINGS) ×2 IMPLANT
DRAPE C-ARM 42X72 X-RAY (DRAPES) ×3 IMPLANT
DRAPE CHEST BREAST 15X10 FENES (DRAPES) ×3 IMPLANT
DRSG COVADERM 4X6 (GAUZE/BANDAGES/DRESSINGS) ×1 IMPLANT
ELECT REM PT RETURN 9FT ADLT (ELECTROSURGICAL) ×3
ELECTRODE REM PT RTRN 9FT ADLT (ELECTROSURGICAL) ×2 IMPLANT
GAUZE 4X4 16PLY ~~LOC~~+RFID DBL (SPONGE) ×3 IMPLANT
GLOVE BIO SURGEON STRL SZ7.5 (GLOVE) ×3 IMPLANT
GLOVE BIOGEL PI IND STRL 8 (GLOVE) ×2 IMPLANT
GLOVE BIOGEL PI INDICATOR 8 (GLOVE) ×1
GLOVE SURG POLY ORTHO LF SZ7.5 (GLOVE) IMPLANT
GLOVE SURG UNDER LTX SZ8 (GLOVE) ×3 IMPLANT
GOWN STRL REUS W/ TWL LRG LVL3 (GOWN DISPOSABLE) ×6 IMPLANT
GOWN STRL REUS W/TWL LRG LVL3 (GOWN DISPOSABLE) ×9
KIT BASIN OR (CUSTOM PROCEDURE TRAY) ×3 IMPLANT
KIT PALINDROME-P 55CM (CATHETERS) IMPLANT
KIT TURNOVER KIT B (KITS) ×3 IMPLANT
NDL 18GX1X1/2 (RX/OR ONLY) (NEEDLE) ×2 IMPLANT
NDL HYPO 25GX1X1/2 BEV (NEEDLE) ×2 IMPLANT
NEEDLE 18GX1X1/2 (RX/OR ONLY) (NEEDLE) ×3 IMPLANT
NEEDLE HYPO 25GX1X1/2 BEV (NEEDLE) ×3 IMPLANT
NS IRRIG 1000ML POUR BTL (IV SOLUTION) ×3 IMPLANT
PACK CV ACCESS (CUSTOM PROCEDURE TRAY) ×3 IMPLANT
PACK SURGICAL SETUP 50X90 (CUSTOM PROCEDURE TRAY) ×3 IMPLANT
PAD ARMBOARD 7.5X6 YLW CONV (MISCELLANEOUS) ×6 IMPLANT
SET MICROPUNCTURE 5F STIFF (MISCELLANEOUS) ×1 IMPLANT
SPONGE SURGIFOAM ABS GEL 100 (HEMOSTASIS) IMPLANT
SUT ETHILON 3 0 PS 1 (SUTURE) ×3 IMPLANT
SUT MNCRL AB 4-0 PS2 18 (SUTURE) ×4 IMPLANT
SUT PROLENE 6 0 BV (SUTURE) IMPLANT
SUT SILK 0 TIES 10X30 (SUTURE) ×3 IMPLANT
SUT VIC AB 3-0 SH 27 (SUTURE) ×3
SUT VIC AB 3-0 SH 27X BRD (SUTURE) ×2 IMPLANT
SYR 10ML LL (SYRINGE) ×3 IMPLANT
SYR 20ML LL LF (SYRINGE) ×6 IMPLANT
SYR 5ML LL (SYRINGE) ×6 IMPLANT
SYR CONTROL 10ML LL (SYRINGE) ×3 IMPLANT
TOWEL GREEN STERILE (TOWEL DISPOSABLE) ×6 IMPLANT
TOWEL GREEN STERILE FF (TOWEL DISPOSABLE) ×3 IMPLANT
UNDERPAD 30X36 HEAVY ABSORB (UNDERPADS AND DIAPERS) ×3 IMPLANT
WATER STERILE IRR 1000ML POUR (IV SOLUTION) ×3 IMPLANT

## 2022-02-08 NOTE — Anesthesia Procedure Notes (Addendum)
Procedure Name: LMA Insertion ?Date/Time: 02/08/2022 7:38 AM ?Performed by: Bryson Corona, CRNA ?Pre-anesthesia Checklist: Patient identified, Emergency Drugs available, Suction available and Patient being monitored ?Patient Re-evaluated:Patient Re-evaluated prior to induction ?Oxygen Delivery Method: Circle System Utilized ?Preoxygenation: Pre-oxygenation with 100% oxygen ?Induction Type: IV induction ?Ventilation: Mask ventilation without difficulty ?LMA: LMA inserted ?LMA Size: 4.0 ?Number of attempts: 1 ?Placement Confirmation: positive ETCO2 ?Tube secured with: Tape ?Dental Injury: Teeth and Oropharynx as per pre-operative assessment  ? ? ? ? ?

## 2022-02-08 NOTE — Interval H&P Note (Signed)
History and Physical Interval Note: ? ?02/08/2022 ?7:21 AM ? ?[name]  has presented today for surgery, with the diagnosis of ESRD.  The various methods of treatment have been discussed with the patient and family. After consideration of risks, benefits and other options for treatment, the patient has consented to  Procedure(s): ?LIGATION OF LEFT ARTERIOVENOUS  FISTULA (Left) ?INSERTION OF TUNNELED DIALYSIS CATHETER (N/A) as a surgical intervention.  The patient's history has been reviewed, patient examined, no change in status, stable for surgery.  I have reviewed the patient's chart and labs.  Questions were answered to the patient's satisfaction.   ? ? ?Jacob [name] ? ? ?

## 2022-02-08 NOTE — Op Note (Signed)
? ? ?  NAME: [name]    MRN: [id number] ?DOB: 12-26-1950    DATE OF OPERATION: 02/08/2022 ? ?PREOP DIAGNOSIS:   ? ?Steal Syndrome Left Upper Extremity ? ?POSTOP DIAGNOSIS:   ? ?Same ? ?PROCEDURE:  ?  ?Ultrasound-guided placement of left IJ 23 cm tunneled dialysis catheter ?Ligation of left upper arm AV fistula ? ?SURGEON: Judeth Cornfield. Scot Dock, MD ? ?ASSIST: None ? ?ANESTHESIA: General ? ?EBL: Minimal ? ?INDICATIONS:  ? ? Jacob [name] is a 71 y.o. male who presented with nonhealing wounds in the left upper extremity.  He had a functioning fistula.  I felt the only option was ligation of the fistula to improve his chance of healing his wounds.  He would also require placement of a catheter.  The plan was once these wounds had healed to vein map the right upper extremity and evaluate him for access on the right side. ? ?FINDINGS:  ? ?Much improved radial signal with the Doppler at the completion of the procedure.  Of note the right IJ appeared to narrow centrally with evidence of significant collaterals.  Therefore I placed the catheter on the left. ? ?TECHNIQUE:  ? ?The patient was taken to the operating room and received a general anesthetic.  I looked with the ultrasound at both IJ's.  The right IJ appeared to narrow down centrally with evidence of collaterals.  The left IJ was patent.  The neck and upper chest were prepped and draped in usual sterile fashion as one of the left upper extremity. ? ?Under ultrasound guidance, after the skin was anesthetized, I cannulated the left IJ with a micropuncture needle and a micropuncture sheath was introduced over the wire.  This was done under fluoroscopic guidance.  I then advanced the J-wire into the right atrium.  I selected the exit site for the catheter and the catheter was brought through the tunnel.  This was a 23 cm catheter.  The tract over the wire was dilated and then the dilator and peel-away sheath advanced over the wire and the wire and dilator removed.   The catheter was passed through the peel-away sheath and positioned in the right atrium.  At this point the catheter had excellent return and flushed easily.  The catheter was flushed with heparinized saline.  The catheter was secured with a 3-0 nylon suture at the entrance site.  The IJ cannulation site was closed with a 4-0 Monocryl.  Sterile dressing was applied. ? ?Next attention was turned to the left arm.  A longitudinal incision was made over the proximal fistula.  Here the fistula was dissected free and ligated with two 2-0 silk ties.  There was a much improved radial signal with compression of the fistula.  Hemostasis was obtained in this wound and this wound was closed with a 4-0 Monocryl.  Dermabond was applied.  The patient tolerated the procedure well was transferred to recovery room in stable condition.  All needle and sponge counts were correct. ? ? ?Deitra Mayo, MD, FACS ?[facility] ? ?DATE OF DICTATION:   02/08/2022 ? ?

## 2022-02-08 NOTE — Transfer of Care (Signed)
Immediate Anesthesia Transfer of Care Note ? ?Patient: Jacob [name] ? ?Procedure(s) Performed: LIGATION OF LEFT ARTERIOVENOUS  FISTULA (Left: Arm Upper) ?INSERTION OF TUNNELED DIALYSIS CATHETER (Chest) ? ?Patient Location: PACU ? ?Anesthesia Type:General ? ?Level of Consciousness: drowsy and patient cooperative ? ?Airway & Oxygen Therapy: Patient Spontanous Breathing and Patient connected to face mask oxygen ? ?Post-op Assessment: Report given to RN and Post -op Vital signs reviewed and stable ? ?Post vital signs: Reviewed and stable ? ?Last Vitals:  ?Vitals Value Taken Time  ?BP 152/77 02/08/22 0859  ?Temp    ?Pulse 62 02/08/22 0902  ?Resp 16 02/08/22 0904  ?SpO2 100 % 02/08/22 0902  ?Vitals shown include unvalidated device data. ? ?Last Pain:  ?Vitals:  ? 02/08/22 0557  ?TempSrc: Oral  ?PainSc:   ?   ? ?Patients Stated Pain Goal: 2 (02/06/22 1419) ? ?Complications: No notable events documented. ?

## 2022-02-08 NOTE — Anesthesia Postprocedure Evaluation (Signed)
Anesthesia Post Note ? ?Patient: Jacob [name] ? ?Procedure(s) Performed: LIGATION OF LEFT ARTERIOVENOUS  FISTULA (Left: Arm Upper) ?INSERTION OF TUNNELED DIALYSIS CATHETER (Chest) ? ?  ? ?Patient location during evaluation: PACU ?Anesthesia Type: General ?Level of consciousness: awake ?Pain management: pain level controlled ?Vital Signs Assessment: post-procedure vital signs reviewed and stable ?Respiratory status: spontaneous breathing ?Cardiovascular status: stable ?Postop Assessment: no apparent nausea or vomiting ?Anesthetic complications: no ? ? ?No notable events documented. ? ?Last Vitals:  ?Vitals:  ? 02/08/22 0945 02/08/22 1000  ?BP: (!) 166/73 (!) 156/76  ?Pulse: 64 63  ?Resp: 14   ?Temp:  (!) 36.1 ?C  ?SpO2: 96% 97%  ?  ?Last Pain:  ?Vitals:  ? 02/08/22 1000  ?TempSrc:   ?PainSc: Asleep  ? ? ?  ?  ?  ?  ?  ?  ? ?Reuven Braver ? ? ? ? ?

## 2022-02-09 ENCOUNTER — Encounter (HOSPITAL_COMMUNITY): Payer: Self-pay | Admitting: Vascular Surgery

## 2022-02-09 DIAGNOSIS — D631 Anemia in chronic kidney disease: Secondary | ICD-10-CM | POA: Diagnosis not present

## 2022-02-09 DIAGNOSIS — N186 End stage renal disease: Secondary | ICD-10-CM | POA: Diagnosis not present

## 2022-02-09 DIAGNOSIS — L299 Pruritus, unspecified: Secondary | ICD-10-CM | POA: Diagnosis not present

## 2022-02-09 DIAGNOSIS — T8249XA Other complication of vascular dialysis catheter, initial encounter: Secondary | ICD-10-CM | POA: Diagnosis not present

## 2022-02-09 DIAGNOSIS — N2581 Secondary hyperparathyroidism of renal origin: Secondary | ICD-10-CM | POA: Diagnosis not present

## 2022-02-09 DIAGNOSIS — Z992 Dependence on renal dialysis: Secondary | ICD-10-CM | POA: Diagnosis not present

## 2022-02-09 DIAGNOSIS — E1122 Type 2 diabetes mellitus with diabetic chronic kidney disease: Secondary | ICD-10-CM | POA: Diagnosis not present

## 2022-02-10 ENCOUNTER — Encounter (HOSPITAL_COMMUNITY): Payer: Self-pay

## 2022-02-11 DIAGNOSIS — N186 End stage renal disease: Secondary | ICD-10-CM | POA: Diagnosis not present

## 2022-02-11 DIAGNOSIS — L299 Pruritus, unspecified: Secondary | ICD-10-CM | POA: Diagnosis not present

## 2022-02-11 DIAGNOSIS — Z992 Dependence on renal dialysis: Secondary | ICD-10-CM | POA: Diagnosis not present

## 2022-02-11 DIAGNOSIS — T8249XA Other complication of vascular dialysis catheter, initial encounter: Secondary | ICD-10-CM | POA: Diagnosis not present

## 2022-02-11 DIAGNOSIS — E1122 Type 2 diabetes mellitus with diabetic chronic kidney disease: Secondary | ICD-10-CM | POA: Diagnosis not present

## 2022-02-11 DIAGNOSIS — N2581 Secondary hyperparathyroidism of renal origin: Secondary | ICD-10-CM | POA: Diagnosis not present

## 2022-02-11 DIAGNOSIS — D631 Anemia in chronic kidney disease: Secondary | ICD-10-CM | POA: Diagnosis not present

## 2022-02-11 MED FILL — Octreotide Acetate For IM Inj Kit 10 MG: INTRAMUSCULAR | Qty: 1 | Status: AC

## 2022-02-12 ENCOUNTER — Other Ambulatory Visit: Payer: Self-pay | Admitting: Physician Assistant

## 2022-02-15 DIAGNOSIS — H401122 Primary open-angle glaucoma, left eye, moderate stage: Secondary | ICD-10-CM | POA: Diagnosis not present

## 2022-02-15 DIAGNOSIS — H04123 Dry eye syndrome of bilateral lacrimal glands: Secondary | ICD-10-CM | POA: Diagnosis not present

## 2022-02-15 DIAGNOSIS — I4510 Unspecified right bundle-branch block: Secondary | ICD-10-CM | POA: Diagnosis not present

## 2022-02-16 DIAGNOSIS — N2581 Secondary hyperparathyroidism of renal origin: Secondary | ICD-10-CM | POA: Diagnosis not present

## 2022-02-16 DIAGNOSIS — E1122 Type 2 diabetes mellitus with diabetic chronic kidney disease: Secondary | ICD-10-CM | POA: Diagnosis not present

## 2022-02-16 DIAGNOSIS — Z992 Dependence on renal dialysis: Secondary | ICD-10-CM | POA: Diagnosis not present

## 2022-02-16 DIAGNOSIS — L299 Pruritus, unspecified: Secondary | ICD-10-CM | POA: Diagnosis not present

## 2022-02-16 DIAGNOSIS — D631 Anemia in chronic kidney disease: Secondary | ICD-10-CM | POA: Diagnosis not present

## 2022-02-16 DIAGNOSIS — T8249XA Other complication of vascular dialysis catheter, initial encounter: Secondary | ICD-10-CM | POA: Diagnosis not present

## 2022-02-16 DIAGNOSIS — N186 End stage renal disease: Secondary | ICD-10-CM | POA: Diagnosis not present

## 2022-02-17 ENCOUNTER — Telehealth: Payer: Self-pay | Admitting: *Deleted

## 2022-02-17 NOTE — Telephone Encounter (Signed)
Patient called to report a small spot of blood on his bandage at his ligation site. He states it is not bleeding now.patient due at dialysis in the morning and will have them check the site. Advised patient if it stated bleeding a lot go to ER patient verbalized understanding. ?

## 2022-02-18 DIAGNOSIS — T8249XA Other complication of vascular dialysis catheter, initial encounter: Secondary | ICD-10-CM | POA: Diagnosis not present

## 2022-02-18 DIAGNOSIS — E1122 Type 2 diabetes mellitus with diabetic chronic kidney disease: Secondary | ICD-10-CM | POA: Diagnosis not present

## 2022-02-18 DIAGNOSIS — N2581 Secondary hyperparathyroidism of renal origin: Secondary | ICD-10-CM | POA: Diagnosis not present

## 2022-02-18 DIAGNOSIS — N186 End stage renal disease: Secondary | ICD-10-CM | POA: Diagnosis not present

## 2022-02-18 DIAGNOSIS — Z992 Dependence on renal dialysis: Secondary | ICD-10-CM | POA: Diagnosis not present

## 2022-02-18 DIAGNOSIS — L299 Pruritus, unspecified: Secondary | ICD-10-CM | POA: Diagnosis not present

## 2022-02-18 DIAGNOSIS — D631 Anemia in chronic kidney disease: Secondary | ICD-10-CM | POA: Diagnosis not present

## 2022-02-20 DIAGNOSIS — Z992 Dependence on renal dialysis: Secondary | ICD-10-CM | POA: Diagnosis not present

## 2022-02-20 DIAGNOSIS — I129 Hypertensive chronic kidney disease with stage 1 through stage 4 chronic kidney disease, or unspecified chronic kidney disease: Secondary | ICD-10-CM | POA: Diagnosis not present

## 2022-02-20 DIAGNOSIS — N186 End stage renal disease: Secondary | ICD-10-CM | POA: Diagnosis not present

## 2022-02-21 ENCOUNTER — Other Ambulatory Visit: Payer: Self-pay

## 2022-02-21 DIAGNOSIS — M48061 Spinal stenosis, lumbar region without neurogenic claudication: Secondary | ICD-10-CM

## 2022-02-21 DIAGNOSIS — N186 End stage renal disease: Secondary | ICD-10-CM | POA: Diagnosis not present

## 2022-02-21 DIAGNOSIS — D689 Coagulation defect, unspecified: Secondary | ICD-10-CM | POA: Diagnosis not present

## 2022-02-21 DIAGNOSIS — N2581 Secondary hyperparathyroidism of renal origin: Secondary | ICD-10-CM | POA: Diagnosis not present

## 2022-02-21 DIAGNOSIS — D631 Anemia in chronic kidney disease: Secondary | ICD-10-CM | POA: Diagnosis not present

## 2022-02-21 DIAGNOSIS — T8249XA Other complication of vascular dialysis catheter, initial encounter: Secondary | ICD-10-CM | POA: Diagnosis not present

## 2022-02-21 DIAGNOSIS — Z992 Dependence on renal dialysis: Secondary | ICD-10-CM | POA: Diagnosis not present

## 2022-02-21 NOTE — Telephone Encounter (Signed)
Incoming call received from patient stating he left a message last week with Rodena Piety and has yet to hear back. ? ?Patient with increased aches and pain following dialysis, in which Dr.Miller is aware of. Patient had a surgical procedure last week (fistula was removed from arm and placed in chest), which has created more pain along with the pain he is having in 2 fingers on his left had due to swelling, in which he will see the wound specialist for soon. Due to the increased pain patient has taken more of his pain medication, Oxycodone resulting in 10 pills being left and patient would like to know if Dr.Miller will approve an early refill for Oxycodone. ? ? ?Side Note: Treatment agreement is on file from September 2022  ? ? ?Please advise ? ? ?

## 2022-02-22 MED ORDER — OXYCODONE-ACETAMINOPHEN 5-325 MG PO TABS: 1.0000 | ORAL_TABLET | ORAL | 0 refills | Status: DC | PRN

## 2022-02-23 DIAGNOSIS — Z992 Dependence on renal dialysis: Secondary | ICD-10-CM | POA: Diagnosis not present

## 2022-02-23 DIAGNOSIS — T8249XA Other complication of vascular dialysis catheter, initial encounter: Secondary | ICD-10-CM | POA: Diagnosis not present

## 2022-02-23 DIAGNOSIS — N186 End stage renal disease: Secondary | ICD-10-CM | POA: Diagnosis not present

## 2022-02-23 DIAGNOSIS — D689 Coagulation defect, unspecified: Secondary | ICD-10-CM | POA: Diagnosis not present

## 2022-02-23 DIAGNOSIS — D631 Anemia in chronic kidney disease: Secondary | ICD-10-CM | POA: Diagnosis not present

## 2022-02-23 DIAGNOSIS — N2581 Secondary hyperparathyroidism of renal origin: Secondary | ICD-10-CM | POA: Diagnosis not present

## 2022-02-24 ENCOUNTER — Encounter (HOSPITAL_COMMUNITY): Payer: Self-pay

## 2022-02-24 ENCOUNTER — Other Ambulatory Visit: Payer: Medicare Other

## 2022-02-24 ENCOUNTER — Other Ambulatory Visit: Payer: Self-pay

## 2022-02-24 ENCOUNTER — Telehealth: Payer: Self-pay | Admitting: Podiatry

## 2022-02-24 ENCOUNTER — Other Ambulatory Visit (HOSPITAL_COMMUNITY): Payer: Self-pay

## 2022-02-24 DIAGNOSIS — Z79899 Other long term (current) drug therapy: Secondary | ICD-10-CM | POA: Diagnosis not present

## 2022-02-24 DIAGNOSIS — B20 Human immunodeficiency virus [HIV] disease: Secondary | ICD-10-CM

## 2022-02-24 NOTE — Telephone Encounter (Signed)
I called to inform pt that Dr Blenda Mounts wanted him seen back 2-3 weeks which would be week of  5/2. I tried to book an appointment this week and pt declined saying he's busy with other appointments and wont be available till beginning of June.  ? ?Pt also stated he doesn't like that he keeps being passed around to different Doctors I explained to pt that each provider specialized in different things.  ?

## 2022-02-25 ENCOUNTER — Other Ambulatory Visit (HOSPITAL_COMMUNITY): Payer: Self-pay

## 2022-02-25 DIAGNOSIS — D631 Anemia in chronic kidney disease: Secondary | ICD-10-CM | POA: Diagnosis not present

## 2022-02-25 DIAGNOSIS — T8249XA Other complication of vascular dialysis catheter, initial encounter: Secondary | ICD-10-CM | POA: Diagnosis not present

## 2022-02-25 DIAGNOSIS — N186 End stage renal disease: Secondary | ICD-10-CM | POA: Diagnosis not present

## 2022-02-25 DIAGNOSIS — N2581 Secondary hyperparathyroidism of renal origin: Secondary | ICD-10-CM | POA: Diagnosis not present

## 2022-02-25 DIAGNOSIS — D689 Coagulation defect, unspecified: Secondary | ICD-10-CM | POA: Diagnosis not present

## 2022-02-25 DIAGNOSIS — Z992 Dependence on renal dialysis: Secondary | ICD-10-CM | POA: Diagnosis not present

## 2022-02-25 LAB — T-HELPER CELL (CD4) - (RCID CLINIC ONLY)
CD4 % Helper T Cell: 30 % — ABNORMAL LOW (ref 33–65)
CD4 T Cell Abs: 315 /uL — ABNORMAL LOW (ref 400–1790)

## 2022-02-28 ENCOUNTER — Other Ambulatory Visit (HOSPITAL_COMMUNITY): Payer: Self-pay

## 2022-02-28 DIAGNOSIS — N186 End stage renal disease: Secondary | ICD-10-CM | POA: Diagnosis not present

## 2022-02-28 DIAGNOSIS — D631 Anemia in chronic kidney disease: Secondary | ICD-10-CM | POA: Diagnosis not present

## 2022-02-28 DIAGNOSIS — N2581 Secondary hyperparathyroidism of renal origin: Secondary | ICD-10-CM | POA: Diagnosis not present

## 2022-02-28 DIAGNOSIS — T8249XA Other complication of vascular dialysis catheter, initial encounter: Secondary | ICD-10-CM | POA: Diagnosis not present

## 2022-02-28 DIAGNOSIS — D689 Coagulation defect, unspecified: Secondary | ICD-10-CM | POA: Diagnosis not present

## 2022-02-28 DIAGNOSIS — Z992 Dependence on renal dialysis: Secondary | ICD-10-CM | POA: Diagnosis not present

## 2022-03-01 ENCOUNTER — Encounter (HOSPITAL_BASED_OUTPATIENT_CLINIC_OR_DEPARTMENT_OTHER): Payer: Medicare Other | Attending: General Surgery | Admitting: General Surgery

## 2022-03-01 DIAGNOSIS — N2581 Secondary hyperparathyroidism of renal origin: Secondary | ICD-10-CM | POA: Diagnosis not present

## 2022-03-01 DIAGNOSIS — L98492 Non-pressure chronic ulcer of skin of other sites with fat layer exposed: Secondary | ICD-10-CM | POA: Diagnosis not present

## 2022-03-01 DIAGNOSIS — Z794 Long term (current) use of insulin: Secondary | ICD-10-CM | POA: Insufficient documentation

## 2022-03-01 DIAGNOSIS — L98499 Non-pressure chronic ulcer of skin of other sites with unspecified severity: Secondary | ICD-10-CM | POA: Insufficient documentation

## 2022-03-01 DIAGNOSIS — I255 Ischemic cardiomyopathy: Secondary | ICD-10-CM | POA: Insufficient documentation

## 2022-03-01 DIAGNOSIS — B20 Human immunodeficiency virus [HIV] disease: Secondary | ICD-10-CM | POA: Insufficient documentation

## 2022-03-01 DIAGNOSIS — I5042 Chronic combined systolic (congestive) and diastolic (congestive) heart failure: Secondary | ICD-10-CM | POA: Diagnosis not present

## 2022-03-01 DIAGNOSIS — J449 Chronic obstructive pulmonary disease, unspecified: Secondary | ICD-10-CM | POA: Insufficient documentation

## 2022-03-01 DIAGNOSIS — N186 End stage renal disease: Secondary | ICD-10-CM | POA: Diagnosis not present

## 2022-03-01 DIAGNOSIS — F17210 Nicotine dependence, cigarettes, uncomplicated: Secondary | ICD-10-CM | POA: Diagnosis not present

## 2022-03-01 DIAGNOSIS — Z992 Dependence on renal dialysis: Secondary | ICD-10-CM | POA: Diagnosis not present

## 2022-03-01 DIAGNOSIS — E1122 Type 2 diabetes mellitus with diabetic chronic kidney disease: Secondary | ICD-10-CM | POA: Diagnosis not present

## 2022-03-01 DIAGNOSIS — I10 Essential (primary) hypertension: Secondary | ICD-10-CM | POA: Diagnosis not present

## 2022-03-01 DIAGNOSIS — I132 Hypertensive heart and chronic kidney disease with heart failure and with stage 5 chronic kidney disease, or end stage renal disease: Secondary | ICD-10-CM | POA: Diagnosis not present

## 2022-03-01 LAB — CBC
HCT: 34.9 % — ABNORMAL LOW (ref 38.5–50.0)
Hemoglobin: 11.6 g/dL — ABNORMAL LOW (ref 13.2–17.1)
MCH: 34.5 pg — ABNORMAL HIGH (ref 27.0–33.0)
MCHC: 33.2 g/dL (ref 32.0–36.0)
MCV: 103.9 fL — ABNORMAL HIGH (ref 80.0–100.0)
MPV: 11.9 fL (ref 7.5–12.5)
Platelets: 125 10*3/uL — ABNORMAL LOW (ref 140–400)
RBC: 3.36 10*6/uL — ABNORMAL LOW (ref 4.20–5.80)
RDW: 12.8 % (ref 11.0–15.0)
WBC: 4 10*3/uL (ref 3.8–10.8)

## 2022-03-01 LAB — COMPREHENSIVE METABOLIC PANEL
AG Ratio: 1.1 (calc) (ref 1.0–2.5)
ALT: 11 U/L (ref 9–46)
AST: 23 U/L (ref 10–35)
Albumin: 4 g/dL (ref 3.6–5.1)
Alkaline phosphatase (APISO): 72 U/L (ref 35–144)
BUN/Creatinine Ratio: 4 (calc) — ABNORMAL LOW (ref 6–22)
BUN: 29 mg/dL — ABNORMAL HIGH (ref 7–25)
CO2: 31 mmol/L (ref 20–32)
Calcium: 9.9 mg/dL (ref 8.6–10.3)
Chloride: 92 mmol/L — ABNORMAL LOW (ref 98–110)
Creat: 6.53 mg/dL — ABNORMAL HIGH (ref 0.70–1.28)
Globulin: 3.6 g/dL (calc) (ref 1.9–3.7)
Glucose, Bld: 148 mg/dL — ABNORMAL HIGH (ref 65–99)
Potassium: 4.6 mmol/L (ref 3.5–5.3)
Sodium: 134 mmol/L — ABNORMAL LOW (ref 135–146)
Total Bilirubin: 0.4 mg/dL (ref 0.2–1.2)
Total Protein: 7.6 g/dL (ref 6.1–8.1)

## 2022-03-01 LAB — HIV-1 RNA QUANT-NO REFLEX-BLD
HIV 1 RNA Quant: NOT DETECTED copies/mL
HIV-1 RNA Quant, Log: NOT DETECTED Log copies/mL

## 2022-03-01 LAB — LIPID PANEL
Cholesterol: 154 mg/dL (ref <200)
HDL: 34 mg/dL — ABNORMAL LOW (ref >=40)
LDL Cholesterol (Calc): 80 mg/dL (calc)
Non-HDL Cholesterol (Calc): 120 mg/dL (calc) (ref <130)
Total CHOL/HDL Ratio: 4.5 (calc) (ref <5.0)
Triglycerides: 332 mg/dL — ABNORMAL HIGH (ref <150)

## 2022-03-01 LAB — RPR: RPR Ser Ql: NONREACTIVE

## 2022-03-01 NOTE — Progress Notes (Signed)
Jacob [name], Jacob [name] ([id number]) ?Visit Report for 03/01/2022 ?Allergy List Details ?Patient Name: Date of Service: ?[name], [name] 03/01/2022 8:00 A M ?Medical Record Number: [id number] ?Patient Account Number: [id number] ?Date of Birth/Sex: Treating RN: ?[birth year] (71 y.o. [name]) Dellie Catholic ?Primary Care Kaycee Haycraft: Alain Honey Other Clinician: ?Referring Dezyrae Kensinger: ?Treating Isamar Wellbrock/Extender: Fredirick Maudlin ?Alain Honey ?Weeks in Treatment: 0 ?Allergies ?Active Allergies ?Amphetamine Analogues ?Reaction: hallucinations ?Allergy Notes ?Electronic Signature(s) ?Signed: 03/01/2022 9:43:47 AM By: Dellie Catholic RN ?Entered By: Dellie Catholic on 03/01/2022 08:10:11 ?-------------------------------------------------------------------------------- ?Arrival Information Details ?Patient Name: Date of Service: ?[name], [name] 03/01/2022 8:00 A M ?Medical Record Number: [id number] ?Patient Account Number: [id number] ?Date of Birth/Sex: Treating RN: ?[birth year] (71 y.o. [name]) Dellie Catholic ?Primary Care Coraima Tibbs: Alain Honey Other Clinician: ?Referring Wasim Hurlbut: ?Treating Sherrel Shafer/Extender: Fredirick Maudlin ?Alain Honey ?Weeks in Treatment: 0 ?Visit Information ?Patient Arrived: [name] ?Arrival Time: 07:49 ?Accompanied By: self ?Transfer Assistance: None ?Patient Identification Verified: Yes ?History Since Last Visit ?Added or deleted any medications: No ?Any new allergies or adverse reactions: No ?Had a fall or experienced change in activities of daily living that may affect risk of falls: No ?Signs or symptoms of abuse/neglect since last visito No ?Hospitalized since last visit: No ?Implantable device outside of the clinic excluding cellular tissue based products placed in the center since last visit: No ?Electronic Signature(s) ?Signed: 03/01/2022 9:43:47 AM By: Dellie Catholic RN ?Entered By: Dellie Catholic on 03/01/2022  08:08:35 ?-------------------------------------------------------------------------------- ?Clinic Level of Care Assessment Details ?Patient Name: Date of Service: ?[name], [name] 03/01/2022 8:00 A M ?Medical Record Number: [id number] ?Patient Account Number: [id number] ?Date of Birth/Sex: Treating RN: ?[birth year] (70 y.o. [name]) Dellie Catholic ?Primary Care Locke Barrell: Alain Honey Other Clinician: ?Referring Richardine Peppers: ?Treating Verner Kopischke/Extender: Fredirick Maudlin ?Alain Honey ?Weeks in Treatment: 0 ?Clinic Level of Care Assessment Items ?TOOL 1 Quantity Score ?X- 1 0 ?Use when EandM and Procedure is performed on INITIAL visit ?ASSESSMENTS - Nursing Assessment / Reassessment ?X- 1 20 ?General Physical Exam (combine w/ comprehensive assessment (listed just below) when performed on new pt. evals) ?X- 1 25 ?Comprehensive Assessment (HX, ROS, Risk Assessments, Wounds Hx, etc.) ?ASSESSMENTS - Wound and Skin Assessment / Reassessment ?X- 1 10 ?Dermatologic / Skin Assessment (not related to wound area) ?ASSESSMENTS - Ostomy and/or Continence Assessment and Care ?'[]'$  - 0 ?Incontinence Assessment and Management ?'[]'$  - 0 ?Ostomy Care Assessment and Management (repouching, etc.) ?PROCESS - Coordination of Care ?'[]'$  - 0 ?Simple Patient / Family Education for ongoing care ?X- 1 20 ?Complex (extensive) Patient / Family Education for ongoing care ?X- 1 10 ?Staff obtains Consents, Records, T Results / Process Orders ?est ?X- 1 10 ?Staff telephones HHA, Nursing Homes / Clarify orders / etc ?'[]'$  - 0 ?Routine Transfer to another Facility (non-emergent condition) ?'[]'$  - 0 ?Routine Hospital Admission (non-emergent condition) ?X- 1 15 ?New Admissions / Biomedical engineer / Ordering NPWT Apligraf, etc. ?, ?'[]'$  - 0 ?Emergency Hospital Admission (emergent condition) ?PROCESS - Special Needs ?'[]'$  - 0 ?Pediatric / Minor Patient Management ?'[]'$  - 0 ?Isolation Patient Management ?'[]'$  - 0 ?Hearing / Language / Visual special needs ?'[]'$  -  0 ?Assessment of Community assistance (transportation, D/C planning, etc.) ?'[]'$  - 0 ?Additional assistance / Altered mentation ?'[]'$  - 0 ?Support Surface(s) Assessment (bed, cushion, seat, etc.) ?INTERVENTIONS - Miscellaneous ?'[]'$  - 0 ?External ear exam ?'[]'$  - 0 ?Patient Transfer (multiple staff / Civil Service fast streamer / Similar devices) ?'[]'$  - 0 ?Simple Staple / Suture removal (25 or  less) ?'[]'$  - 0 ?Complex Staple / Suture removal (26 or more) ?'[]'$  - 0 ?Hypo/Hyperglycemic Management (do not check if billed separately) ?'[]'$  - 0 ?Ankle / Brachial Index (ABI) - do not check if billed separately ?Has the patient been seen at the hospital within the last three years: Yes ?Total Score: 110 ?Level Of Care: New/Established - Level 3 ?Electronic Signature(s) ?Signed: 03/01/2022 9:43:47 AM By: Dellie Catholic RN ?Signed: 03/01/2022 9:43:47 AM By: Dellie Catholic RN ?Entered By: Dellie Catholic on 03/01/2022 09:40:49 ?-------------------------------------------------------------------------------- ?Encounter Discharge Information Details ?Patient Name: ?Date of Service: ?[name], [name] 03/01/2022 8:00 A M ?Medical Record Number: [id number] ?Patient Account Number: [id number] ?Date of Birth/Sex: ?Treating RN: ?[birth year] (71 y.o. [name]) Dellie Catholic ?Primary Care Iveth Heidemann: Alain Honey ?Other Clinician: ?Referring Phyillis Dascoli: ?Treating Honesty Menta/Extender: Fredirick Maudlin ?Alain Honey ?Weeks in Treatment: 0 ?Encounter Discharge Information Items Post Procedure Vitals ?Discharge Condition: Stable ?Temperature (F): 98.2 ?Ambulatory Status: [name] ?Pulse (bpm): 79 ?Discharge Destination: Home ?Respiratory Rate (breaths/min): 16 ?Transportation: Other ?Blood Pressure (mmHg): 173/78 ?Accompanied By: self ?Schedule Follow-up Appointment: Yes ?Clinical Summary of Care: Patient Declined ?Electronic Signature(s) ?Signed: 03/01/2022 9:43:47 AM By: Dellie Catholic RN ?Entered By: Dellie Catholic on 03/01/2022  09:43:17 ?-------------------------------------------------------------------------------- ?Lower Extremity Assessment Details ?Patient Name: ?Date of Service: ?Bodmer, [name] 03/01/2022 8:00 A M ?Medical Record Number: [id number] ?Patient Account Number: [id number] ?Date of Birth/Sex: ?Treating RN: ?05-Feb-1951 (71 y.o. [name]) Dellie Catholic ?Primary Care Darica Goren: Alain Honey ?Other Clinician: ?Referring Sopheap Basic: ?Treating Sianni Cloninger/Extender: Fredirick Maudlin ?Alain Honey ?Weeks in Treatment: 0 ?Electronic Signature(s) ?Signed: 03/01/2022 9:43:47 AM By: Dellie Catholic RN ?Entered By: Dellie Catholic on 03/01/2022 08:15:40 ?-------------------------------------------------------------------------------- ?Multi Wound Chart Details ?Patient Name: ?Date of Service: ?Ehmann, [name] 03/01/2022 8:00 A M ?Medical Record Number: [id number] ?Patient Account Number: [id number] ?Date of Birth/Sex: ?Treating RN: ?1951/09/16 (71 y.o. [name]) Dellie Catholic ?Primary Care Shanti Agresti: Alain Honey ?Other Clinician: ?Referring Mathew Postiglione: ?Treating Chelsi Warr/Extender: Fredirick Maudlin ?Alain Honey ?Weeks in Treatment: 0 ?Vital Signs ?Height(in): 74 ?Pulse(bpm): 79 ?Weight(lbs): 172 ?Blood Pressure(mmHg): 173/78 ?Body Mass Index(BMI): 22.1 ?Temperature(??F): 98.2 ?Respiratory Rate(breaths/min): 16 ?Photos: ?Left Hand - 2nd Digit Left Hand - 3rd Digit Left, Posterior Upper Arm ?Wound Location: ?Blister Blister Blister ?Wounding Event: ?T be determined ?o T be determined ?o T be determined ?o ?Primary Etiology: ?Cataracts, Glaucoma, Anemia, Chronic Cataracts, Glaucoma, Anemia, Chronic Cataracts, Glaucoma, Anemia, Chronic ?Comorbid History: ?Obstructive Pulmonary Disease Obstructive Pulmonary Disease Obstructive Pulmonary Disease ?(COPD), Congestive Heart Failure, (COPD), Congestive Heart Failure, (COPD), Congestive Heart Failure, ?Coronary Artery Disease, Coronary Artery Disease, Coronary Artery Disease, ?Hypertension, Myocardial  Infarction, Hypertension, Myocardial Infarction, Hypertension, Myocardial Infarction, ?Hepatitis C, Type II Diabetes, Gout, Hepatitis C, Type II Diabetes, Gout, Hepatitis C, Type II Diabetes, Gout, ?Osteoarthritis, Neuropathy Osteoarthritis, Neuropathy Osteoarthritis, Neuropathy ?11/09/2021 11/09/2021 11/09/2021 ?Date Acquired: ?0 0 0 ?Weeks of Treatment: ?

## 2022-03-01 NOTE — Progress Notes (Signed)
ZYMERE, PATLAN ([id number]) ?Visit Report for 03/01/2022 ?Abuse Risk Screen Details ?Patient Name: Date of Service: ?Detienne, Lyman Bishop M R. 03/01/2022 8:00 A M ?Medical Record Number: [id number] ?Patient Account Number: [id number] ?Date of Birth/Sex: Treating RN: ?Mar 07, 1951 (71 y.o. [name]) Dellie Catholic ?Primary Care Keveon Amsler: Alain Honey Other Clinician: ?Referring Tyreisha Ungar: ?Treating Lounell Schumacher/Extender: Fredirick Maudlin ?Alain Honey ?Weeks in Treatment: 0 ?Abuse Risk Screen Items ?Answer ?ABUSE RISK SCREEN: ?Has anyone close to you tried to hurt or harm you recentlyo No ?Do you feel uncomfortable with anyone in your familyo No ?Has anyone forced you do things that you didnt want to doo No ?Electronic Signature(s) ?Signed: 03/01/2022 9:43:47 AM By: Dellie Catholic RN ?Entered By: Dellie Catholic on 03/01/2022 08:13:25 ?-------------------------------------------------------------------------------- ?Activities of Daily Living Details ?Patient Name: Date of Service: ?Tamez, Lyman Bishop M R. 03/01/2022 8:00 A M ?Medical Record Number: [id number] ?Patient Account Number: [id number] ?Date of Birth/Sex: Treating RN: ?Nov 11, 1950 (71 y.o. [name]) Dellie Catholic ?Primary Care Idella Lamontagne: Alain Honey Other Clinician: ?Referring Ellyssa Zagal: ?Treating Kathye Cipriani/Extender: Fredirick Maudlin ?Alain Honey ?Weeks in Treatment: 0 ?Activities of Daily Living Items ?Answer ?Activities of Daily Living (Please select one for each item) ?Drive Automobile Not Able ?T Medications ?ake Completely Able ?Use T elephone Completely Able ?Care for Appearance Completely Able ?Use T oilet Completely Able ?Bath / Shower Completely Able ?Dress Self Completely Able ?Feed Self Completely Able ?Walk Completely Able ?Get In / Out Bed Completely Able ?Housework Completely Able ?Prepare Meals Completely Able ?Handle Money Completely Able ?Shop for Self Completely Able ?Electronic Signature(s) ?Signed: 03/01/2022 9:43:47 AM By: Dellie Catholic RN ?Entered By: Dellie Catholic on 03/01/2022 08:14:16 ?-------------------------------------------------------------------------------- ?Education Screening Details ?Patient Name: ?Date of Service: ?Mcgillivray, Lyman Bishop M R. 03/01/2022 8:00 A M ?Medical Record Number: [id number] ?Patient Account Number: [id number] ?Date of Birth/Sex: ?Treating RN: ?January 14, 1951 (71 y.o. [name]) Dellie Catholic ?Primary Care Yechezkel Fertig: Alain Honey ?Other Clinician: ?Referring Narjis Mira: ?Treating Maureen Delatte/Extender: Fredirick Maudlin ?Alain Honey ?Weeks in Treatment: 0 ?Learning Preferences/Education Level/Primary Language ?Learning Preference: Explanation, Demonstration, Printed Material ?Highest Education Level: High School ?Preferred Language: English ?Cognitive Barrier ?Language Barrier: No ?Translator Needed: No ?Memory Deficit: No ?Emotional Barrier: No ?Cultural/Religious Beliefs Affecting Medical Care: No ?Physical Barrier ?Impaired Vision: No ?Impaired Hearing: No ?Decreased Hand dexterity: No ?Knowledge/Comprehension ?Knowledge Level: High ?Comprehension Level: High ?Ability to understand written instructions: High ?Ability to understand verbal instructions: High ?Motivation ?Anxiety Level: Calm ?Cooperation: Cooperative ?Education Importance: Acknowledges Need ?Interest in Health Problems: Asks Questions ?Perception: Coherent ?Willingness to Engage in Self-Management High ?Activities: ?Readiness to Engage in Self-Management High ?Activities: ?Electronic Signature(s) ?Signed: 03/01/2022 9:43:47 AM By: Dellie Catholic RN ?Entered By: Dellie Catholic on 03/01/2022 08:14:54 ?-------------------------------------------------------------------------------- ?Fall Risk Assessment Details ?Patient Name: ?Date of Service: ?Soliday, Lyman Bishop M R. 03/01/2022 8:00 A M ?Medical Record Number: [id number] ?Patient Account Number: [id number] ?Date of Birth/Sex: ?Treating RN: ?08-04-1951 (71 y.o. [name]) Dellie Catholic ?Primary Care Mitali Shenefield: Alain Honey ?Other Clinician: ?Referring  Gianna Calef: ?Treating Eoin Willden/Extender: Fredirick Maudlin ?Alain Honey ?Weeks in Treatment: 0 ?Fall Risk Assessment Items ?Have you had 2 or more falls in the last 12 monthso 0 No ?Have you had any fall that resulted in injury in the last 12 monthso 0 No ?FALLS RISK SCREEN ?History of falling - immediate or within 3 months 0 No ?Secondary diagnosis (Do you have 2 or more medical diagnoseso) 0 No ?Ambulatory aid ?None/bed rest/wheelchair/nurse 0 No ?Crutches/cane/walker 15 Yes ?Furniture 0 No ?Intravenous therapy Access/Saline/Heparin Lock 0 No ?Gait/Transferring ?Normal/ bed rest/ wheelchair 0  No ?Weak (short steps with or without shuffle, stooped but able to lift head while walking, may seek 0 No ?support from furniture) ?Impaired (short steps with shuffle, may have difficulty arising from chair, head down, impaired 0 No ?balance) ?Mental Status ?Oriented to own ability 0 No ?Electronic Signature(s) ?Signed: 03/01/2022 9:43:47 AM By: Dellie Catholic RN ?Entered By: Dellie Catholic on 03/01/2022 08:15:05 ?-------------------------------------------------------------------------------- ?Foot Assessment Details ?Patient Name: ?Date of Service: ?Fuentes, Lyman Bishop M R. 03/01/2022 8:00 A M ?Medical Record Number: [id number] ?Patient Account Number: [id number] ?Date of Birth/Sex: ?Treating RN: ?18-Feb-1951 (71 y.o. [name]) Dellie Catholic ?Primary Care Kasiah Manka: Alain Honey ?Other Clinician: ?Referring Shalimar Mcclain: ?Treating Ashling Roane/Extender: Fredirick Maudlin ?Alain Honey ?Weeks in Treatment: 0 ?Foot Assessment Items ?Site Locations ?+ = Sensation present, - = Sensation absent, C = Callus, U = Ulcer ?R = Redness, W = Warmth, M = Maceration, PU = Pre-ulcerative lesion ?F = Fissure, S = Swelling, D = Dryness ?Assessment ?Right: Left: ?Other Deformity: No No ?Prior Foot Ulcer: No No ?Prior Amputation: No No ?Charcot Joint: No No ?Ambulatory Status: Ambulatory With Help ?Assistance Device: Cane ?Gait: Steady ?Electronic  Signature(s) ?Signed: 03/01/2022 9:43:47 AM By: Dellie Catholic RN ?Entered By: Dellie Catholic on 03/01/2022 08:15:30 ?-------------------------------------------------------------------------------- ?Nutrition Risk Screening Details ?Patient Name: ?Date of Service: ?Nellums, Lyman Bishop M R. 03/01/2022 8:00 A M ?Medical Record Number: [id number] ?Patient Account Number: [id number] ?Date of Birth/Sex: ?Treating RN: ?Nov 09, 1950 (71 y.o. [name]) Dellie Catholic ?Primary Care Jezabelle Chisolm: Alain Honey ?Other Clinician: ?Referring Fajr Fife: ?Treating Quincey Nored/Extender: Fredirick Maudlin ?Alain Honey ?Weeks in Treatment: 0 ?Height (in): 74 ?Weight (lbs): 172 ?Body Mass Index (BMI): 22.1 ?Nutrition Risk Screening Items ?Score Screening ?NUTRITION RISK SCREEN: ?I have an illness or condition that made me change the kind and/or amount of food I eat 0 No ?I eat fewer than two meals per day 0 No ?I eat few fruits and vegetables, or milk products 0 No ?I have three or more drinks of beer, liquor or wine almost every day 0 No ?I have tooth or mouth problems that make it hard for me to eat 0 No ?I don't always have enough money to buy the food I need 0 No ?I eat alone most of the time 0 No ?I take three or more different prescribed or over-the-counter drugs a day 0 No ?Without wanting to, I have lost or gained 10 pounds in the last six months 0 No ?I am not always physically able to shop, cook and/or feed myself 0 No ?Nutrition Protocols ?Good Risk Protocol ?Moderate Risk Protocol ?High Risk Proctocol ?Risk Level: Good Risk ?Score: 0 ?Electronic Signature(s) ?Signed: 03/01/2022 9:43:47 AM By: Dellie Catholic RN ?Entered By: Dellie Catholic on 03/01/2022 08:15:16 ?

## 2022-03-01 NOTE — Progress Notes (Signed)
DEYTON, Jacob [name] ([id number]) ?Visit Report for 03/01/2022 ?Chief Complaint Document Details ?Patient Name: Date of Service: ?[name], Jacob [name]. 03/01/2022 8:00 A M ?Medical Record Number: [id number] ?Patient Account Number: [id number] ?Date of Birth/Sex: Treating RN: ?24-Nov-1950 (71 y.o. Jacob [name]) Dellie Catholic ?Primary Care Provider: Alain Honey Other Clinician: ?Referring Provider: ?Treating Provider/Extender: Fredirick Maudlin ?Alain Honey ?Weeks in Treatment: 0 ?Information Obtained from: Patient ?Chief Complaint ?Patient seen for complaints of Non-Healing Wounds x 3 ?Electronic Signature(s) ?Signed: 03/01/2022 9:02:59 AM By: Fredirick Maudlin MD FACS ?Entered By: Fredirick Maudlin on 03/01/2022 09:02:59 ?-------------------------------------------------------------------------------- ?Debridement Details ?Patient Name: Date of Service: ?[name], Jacob [name]. 03/01/2022 8:00 A M ?Medical Record Number: [id number] ?Patient Account Number: [id number] ?Date of Birth/Sex: Treating RN: ?1950-11-30 (71 y.o. Jacob [name]) Dellie Catholic ?Primary Care Provider: Alain Honey Other Clinician: ?Referring Provider: ?Treating Provider/Extender: Fredirick Maudlin ?Alain Honey ?Weeks in Treatment: 0 ?Debridement Performed for Assessment: Wound #7 Left,Posterior Upper Arm ?Performed By: Physician Fredirick Maudlin, MD ?Debridement Type: Debridement ?Level of Consciousness (Pre-procedure): Awake and Alert ?Pre-procedure Verification/Time Out Yes - 08:43 ?Taken: ?Start Time: 08:43 ?Pain Control: Lidocaine 4% T opical Solution ?T Area Debrided (L x W): ?otal 2.2 (cm) x 2 (cm) = 4.4 (cm?) ?Tissue and other material debrided: Non-Viable, Callus, Eschar, [place] ?Level: Non-Viable Tissue ?Debridement Description: Selective/Open Wound ?Instrument: Blade, Curette, Forceps ?Bleeding: Minimum ?Hemostasis Achieved: Pressure ?End Time: 08:46 ?Procedural Pain: 0 ?Post Procedural Pain: 0 ?Response to Treatment: Procedure was tolerated well ?Level of  Consciousness (Post- Awake and Alert ?procedure): ?Post Debridement Measurements of Total Wound ?Length: (cm) 2.2 ?Width: (cm) 2 ?Depth: (cm) 0.1 ?Volume: (cm?) 0.346 ?Character of Wound/Ulcer Post Debridement: Improved ?Post Procedure Diagnosis ?Same as Pre-procedure ?Electronic Signature(s) ?Signed: 03/01/2022 9:43:47 AM By: Dellie Catholic RN ?Signed: 03/01/2022 11:52:00 AM By: Fredirick Maudlin MD FACS ?Entered By: Dellie Catholic on 03/01/2022 08:56:08 ?-------------------------------------------------------------------------------- ?Debridement Details ?Patient Name: ?Date of Service: ?[name], Jacob [name]. 03/01/2022 8:00 A M ?Medical Record Number: [id number] ?Patient Account Number: [id number] ?Date of Birth/Sex: ?Treating RN: ?Dec 20, 1950 (71 y.o. Jacob [name]) Dellie Catholic ?Primary Care Provider: Alain Honey ?Other Clinician: ?Referring Provider: ?Treating Provider/Extender: Fredirick Maudlin ?Alain Honey ?Weeks in Treatment: 0 ?Debridement Performed for Assessment: Wound #6 Left Hand - 3rd Digit ?Performed By: Physician Fredirick Maudlin, MD ?Debridement Type: Debridement ?Level of Consciousness (Pre-procedure): Awake and Alert ?Pre-procedure Verification/Time Out Yes - 08:43 ?Taken: ?Start Time: 08:43 ?Pain Control: Lidocaine 4% T opical Solution ?T Area Debrided (L x W): ?otal 0.7 (cm) x 0.6 (cm) = 0.42 (cm?) ?Tissue and other material debrided: Non-Viable, Callus, Eschar, [place] ?Level: Non-Viable Tissue ?Debridement Description: Selective/Open Wound ?Instrument: Blade, Curette, Forceps ?Bleeding: Minimum ?Hemostasis Achieved: Pressure ?End Time: 08:46 ?Procedural Pain: 0 ?Post Procedural Pain: 0 ?Response to Treatment: Procedure was tolerated well ?Level of Consciousness (Post- Awake and Alert ?procedure): ?Post Debridement Measurements of Total Wound ?Length: (cm) 0.7 ?Width: (cm) 0.6 ?Depth: (cm) 0.1 ?Volume: (cm?) 0.033 ?Character of Wound/Ulcer Post Debridement: Improved ?Post Procedure Diagnosis ?Same  as Pre-procedure ?Electronic Signature(s) ?Signed: 03/01/2022 9:43:47 AM By: Dellie Catholic RN ?Signed: 03/01/2022 11:52:00 AM By: Fredirick Maudlin MD FACS ?Entered By: Dellie Catholic on 03/01/2022 08:57:18 ?-------------------------------------------------------------------------------- ?HPI Details ?Patient Name: ?Date of Service: ?[name], Jacob [name]. 03/01/2022 8:00 A M ?Medical Record Number: [id number] ?Patient Account Number: [id number] ?Date of Birth/Sex: ?Treating RN: ?07-05-1951 (71 y.o. Jacob [name]) Dellie Catholic ?Primary Care Provider: Alain Honey Other Clinician: ?Referring Provider: ?Treating Provider/Extender: Fredirick Maudlin ?Alain Honey ?Weeks in Treatment: 0 ?History of  Present Illness ?Location: 2 ulcers noted to the left hallux ?Quality: the patient denies pain ?Duration: these were originally blisters towards the end of February and opened in early March ?Context: these wounds are secondary to pressure; he got new shoes in February and also started wearing a toe cover/sleeve ?Modifying Factors: pressure and friction are exacerbating factor ?HPI Description: 01/12/17- Mr. Kynard arrives for initial evaluation of 2 ulcers to his left hallux. He states that back in February he did receive a new pair of ?diabetic shoes and at that time he started wearing toe cover/sleeves and noticed development of a blister to the dorsal and plantar aspect. He has been seen ?at [facility] by both Dr. Amalia Hailey and Dr. Jacqualyn Posey. He last saw Dr.Wagoner on 3/8 at which time he was prescribed Silvadene and Keflex. He completed ?that dose of Keflex and states that he got a phone call to extend the Keflex prior to this appointment. He has a follow-up with Dr. Amalia Hailey on 3/26. He has been ?wearing a surgical shoe, no significant offloading to the plantar/medial ulcer but the strap does not extend over the dorsal ulcer. Topically he has been applying ?the Silvadene daily and a dry dressing. ?He is a diabetic, on insulin, with a  recent A1c of 6.2% (11/10/16). He is a current smoker, 0.5 PPD. He has no known diagnosis of arterial insufficiency. ?01/20/17- The patient arrives for follow up evaluation of his ulcers to the left hallux. He admits to continuing to smoke approximately 0.5ppd. He states that his ?diet has been somewhat uncontrolled with higher than normal glucose levels, this seems circumstantial as he had a death in the family as food and schedule ?has been disrupted. he admits to wearing the surgical show with offloading modifications while out of the home, but has been ambulating barefoot around the ?home. he saw Dr. Amalia Hailey yesterday for toenail trimming and callus shaving to the right plantar foot and investigation of his ulcers. ?01/26/17- he is here for follow-up evaluation of his left hallux ulcers. He states he has been significantly more compliant in the use of his offloading surgical ?shoe, with the exception of "getting up for a glass of water" he states that he has worn it with any ambulation. He states he has decreased his smoking too, ?admitting to only a cigarette since yesterday. He states his blood sugar this morning was 147 and yesterday was 116. He is voicing no complaints or concerns ?02/02/17- He is here for follow-up evaluation of his left hallux ulcers. The dorsal ulcer is crusted over/healed. He states he has minimized his ambulation over ?this past week, much less than the previous week. He states his glucose levels have been between 120-150 about half of the time, otherwise greater than 150. ?He cannot correlate any specific dietary changes. He does have an appointment with a dietitian next month. He was advised to keep a log of his glucose ?levels and his diet for that appointment. ?02/06/17; patient comes in today for first application of the total contact cast. Her intake nurse reported the wounds look better therefore I did not actually see ?these before application of the cast. He'll return on Thursday for  reapplication in the standard fashion ?02/10/27- he is here for follow-up evaluation of his left plantar hallux ulcer. He came in Monday for application of a total contact cast. He states that he was ?tolerat

## 2022-03-02 DIAGNOSIS — Z992 Dependence on renal dialysis: Secondary | ICD-10-CM | POA: Diagnosis not present

## 2022-03-02 DIAGNOSIS — N186 End stage renal disease: Secondary | ICD-10-CM | POA: Diagnosis not present

## 2022-03-02 DIAGNOSIS — N2581 Secondary hyperparathyroidism of renal origin: Secondary | ICD-10-CM | POA: Diagnosis not present

## 2022-03-02 DIAGNOSIS — T8249XA Other complication of vascular dialysis catheter, initial encounter: Secondary | ICD-10-CM | POA: Diagnosis not present

## 2022-03-02 DIAGNOSIS — D631 Anemia in chronic kidney disease: Secondary | ICD-10-CM | POA: Diagnosis not present

## 2022-03-02 DIAGNOSIS — D689 Coagulation defect, unspecified: Secondary | ICD-10-CM | POA: Diagnosis not present

## 2022-03-03 ENCOUNTER — Ambulatory Visit (HOSPITAL_COMMUNITY)
Admission: RE | Admit: 2022-03-03 | Discharge: 2022-03-03 | Disposition: A | Discharge status: Home / Self Care | Payer: Medicare Other | Source: Ambulatory Visit | Attending: Gastroenterology | Admitting: Gastroenterology

## 2022-03-03 DIAGNOSIS — Q2730 Arteriovenous malformation, site unspecified: Secondary | ICD-10-CM | POA: Insufficient documentation

## 2022-03-03 MED ORDER — OCTREOTIDE ACETATE 10 MG IM KIT
10.0000 mg | PACK | INTRAMUSCULAR | Status: DC
  Administered 2022-03-03: 10 mg via INTRAMUSCULAR

## 2022-03-04 ENCOUNTER — Telehealth: Payer: Self-pay

## 2022-03-04 DIAGNOSIS — Z992 Dependence on renal dialysis: Secondary | ICD-10-CM | POA: Diagnosis not present

## 2022-03-04 DIAGNOSIS — N2581 Secondary hyperparathyroidism of renal origin: Secondary | ICD-10-CM | POA: Diagnosis not present

## 2022-03-04 DIAGNOSIS — T8249XA Other complication of vascular dialysis catheter, initial encounter: Secondary | ICD-10-CM | POA: Diagnosis not present

## 2022-03-04 DIAGNOSIS — N186 End stage renal disease: Secondary | ICD-10-CM | POA: Diagnosis not present

## 2022-03-04 DIAGNOSIS — D689 Coagulation defect, unspecified: Secondary | ICD-10-CM | POA: Diagnosis not present

## 2022-03-04 DIAGNOSIS — D631 Anemia in chronic kidney disease: Secondary | ICD-10-CM | POA: Diagnosis not present

## 2022-03-04 NOTE — Telephone Encounter (Signed)
Cmn submitted ?

## 2022-03-07 ENCOUNTER — Telehealth: Payer: Self-pay

## 2022-03-07 DIAGNOSIS — D631 Anemia in chronic kidney disease: Secondary | ICD-10-CM | POA: Diagnosis not present

## 2022-03-07 DIAGNOSIS — T8249XA Other complication of vascular dialysis catheter, initial encounter: Secondary | ICD-10-CM | POA: Diagnosis not present

## 2022-03-07 DIAGNOSIS — N186 End stage renal disease: Secondary | ICD-10-CM | POA: Diagnosis not present

## 2022-03-07 DIAGNOSIS — Z992 Dependence on renal dialysis: Secondary | ICD-10-CM | POA: Diagnosis not present

## 2022-03-07 DIAGNOSIS — N2581 Secondary hyperparathyroidism of renal origin: Secondary | ICD-10-CM | POA: Diagnosis not present

## 2022-03-07 DIAGNOSIS — D689 Coagulation defect, unspecified: Secondary | ICD-10-CM | POA: Diagnosis not present

## 2022-03-07 MED FILL — Octreotide Acetate For IM Inj Kit 10 MG: INTRAMUSCULAR | Qty: 1 | Status: AC

## 2022-03-07 NOTE — Telephone Encounter (Signed)
Pt called from HD and was difficult to understand. He is c/o his chest catheter not working sufficiently at HD. He is going to have the HD nurse call us, as she was tied up and unable to talk . ?

## 2022-03-07 NOTE — Telephone Encounter (Signed)
Pt's HD nurse called to let us know they have a plan to try to improve flow of his catheter. She is unsure why pt called [place]. She will explain the plan again to pt.  ?

## 2022-03-08 ENCOUNTER — Encounter (HOSPITAL_BASED_OUTPATIENT_CLINIC_OR_DEPARTMENT_OTHER): Payer: Medicare Other | Admitting: General Surgery

## 2022-03-08 DIAGNOSIS — I132 Hypertensive heart and chronic kidney disease with heart failure and with stage 5 chronic kidney disease, or end stage renal disease: Secondary | ICD-10-CM | POA: Diagnosis not present

## 2022-03-08 DIAGNOSIS — N186 End stage renal disease: Secondary | ICD-10-CM | POA: Diagnosis not present

## 2022-03-08 DIAGNOSIS — J449 Chronic obstructive pulmonary disease, unspecified: Secondary | ICD-10-CM | POA: Diagnosis not present

## 2022-03-08 DIAGNOSIS — N2581 Secondary hyperparathyroidism of renal origin: Secondary | ICD-10-CM | POA: Diagnosis not present

## 2022-03-08 DIAGNOSIS — L98499 Non-pressure chronic ulcer of skin of other sites with unspecified severity: Secondary | ICD-10-CM | POA: Diagnosis not present

## 2022-03-08 DIAGNOSIS — L98492 Non-pressure chronic ulcer of skin of other sites with fat layer exposed: Secondary | ICD-10-CM | POA: Diagnosis not present

## 2022-03-08 DIAGNOSIS — S91301A Unspecified open wound, right foot, initial encounter: Secondary | ICD-10-CM | POA: Diagnosis not present

## 2022-03-08 DIAGNOSIS — E11621 Type 2 diabetes mellitus with foot ulcer: Secondary | ICD-10-CM | POA: Diagnosis not present

## 2022-03-08 DIAGNOSIS — Z992 Dependence on renal dialysis: Secondary | ICD-10-CM | POA: Diagnosis not present

## 2022-03-08 DIAGNOSIS — I255 Ischemic cardiomyopathy: Secondary | ICD-10-CM | POA: Diagnosis not present

## 2022-03-08 DIAGNOSIS — E1122 Type 2 diabetes mellitus with diabetic chronic kidney disease: Secondary | ICD-10-CM | POA: Diagnosis not present

## 2022-03-08 DIAGNOSIS — I10 Essential (primary) hypertension: Secondary | ICD-10-CM | POA: Diagnosis not present

## 2022-03-08 DIAGNOSIS — I5042 Chronic combined systolic (congestive) and diastolic (congestive) heart failure: Secondary | ICD-10-CM | POA: Diagnosis not present

## 2022-03-08 DIAGNOSIS — Z794 Long term (current) use of insulin: Secondary | ICD-10-CM | POA: Diagnosis not present

## 2022-03-08 DIAGNOSIS — L97512 Non-pressure chronic ulcer of other part of right foot with fat layer exposed: Secondary | ICD-10-CM | POA: Diagnosis not present

## 2022-03-08 DIAGNOSIS — F17210 Nicotine dependence, cigarettes, uncomplicated: Secondary | ICD-10-CM | POA: Diagnosis not present

## 2022-03-08 NOTE — Progress Notes (Signed)
Jacob, [name] ([id number]) ?Visit Report for 03/08/2022 ?Chief Complaint Document Details ?Patient Name: Date of Service: ?Jacob [name], Jacob M R. 03/08/2022 11:00 A M ?Medical Record Number: [id number] ?Patient Account Number: [id number] ?Date of Birth/Sex: Treating RN: ?July 05, 1951 (71 y.o. Jacob [name]) Dellie Catholic ?Primary Care Provider: Alain Honey Other Clinician: ?Referring Provider: ?Treating Provider/Extender: Fredirick Maudlin ?Alain Honey ?Weeks in Treatment: 1 ?Information Obtained from: Patient ?Chief Complaint ?Patient seen for complaints of Non-Healing Wounds x 3 ?Electronic Signature(s) ?Signed: 03/08/2022 12:20:21 PM By: Fredirick Maudlin MD FACS ?Entered By: Fredirick Maudlin on 03/08/2022 12:20:20 ?-------------------------------------------------------------------------------- ?Debridement Details ?Patient Name: Date of Service: ?Jacob, [name] M R. 03/08/2022 11:00 A M ?Medical Record Number: [id number] ?Patient Account Number: [id number] ?Date of Birth/Sex: Treating RN: ?08-May-1951 (71 y.o. Janyth Contes ?Primary Care Provider: Alain Honey Other Clinician: ?Referring Provider: ?Treating Provider/Extender: Fredirick Maudlin ?Alain Honey ?Weeks in Treatment: 1 ?Debridement Performed for Assessment: Wound #5 Left Hand - 2nd Digit ?Performed By: Physician Fredirick Maudlin, MD ?Debridement Type: Debridement ?Level of Consciousness (Pre-procedure): Awake and Alert ?Pre-procedure Verification/Time Out Yes - 11:56 ?Taken: ?Start Time: 11:56 ?T Area Debrided (L x W): ?otal 1 (cm) x 1 (cm) = 1 (cm?) ?Tissue and other material debrided: Non-Viable, Eschar, [place], [place] ?Level: Non-Viable Tissue ?Debridement Description: Selective/Open Wound ?Instrument: Curette ?Bleeding: Minimum ?Hemostasis Achieved: Pressure ?End Time: 11:59 ?Procedural Pain: 0 ?Post Procedural Pain: 0 ?Response to Treatment: Procedure was tolerated well ?Level of Consciousness (Post- Awake and Alert ?procedure): ?Post Debridement  Measurements of Total Wound ?Length: (cm) 1 ?Width: (cm) 1 ?Depth: (cm) 0.1 ?Volume: (cm?) 0.079 ?Character of Wound/Ulcer Post Debridement: Improved ?Post Procedure Diagnosis ?Same as Pre-procedure ?Electronic Signature(s) ?Signed: 03/08/2022 12:37:10 PM By: Fredirick Maudlin MD FACS ?Signed: 03/08/2022 5:45:24 PM By: Levan Hurst RN, BSN ?Entered By: Levan Hurst on 03/08/2022 12:03:29 ?-------------------------------------------------------------------------------- ?Debridement Details ?Patient Name: ?Date of Service: ?[name], [name] M R. 03/08/2022 11:00 A M ?Medical Record Number: [id number] ?Patient Account Number: [id number] ?Date of Birth/Sex: ?Treating RN: ?17-Jul-1951 (71 y.o. Janyth Contes ?Primary Care Provider: Alain Honey ?Other Clinician: ?Referring Provider: ?Treating Provider/Extender: Fredirick Maudlin ?Alain Honey ?Weeks in Treatment: 1 ?Debridement Performed for Assessment: Wound #8 Right,Plantar Foot ?Performed By: Physician Fredirick Maudlin, MD ?Debridement Type: Debridement ?Severity of Tissue Pre Debridement: Fat layer exposed ?Level of Consciousness (Pre-procedure): Awake and Alert ?Pre-procedure Verification/Time Out Yes - 11:56 ?Taken: ?Start Time: 11:56 ?T Area Debrided (L x W): ?otal 2 (cm) x 2 (cm) = 4 (cm?) ?Tissue and other material debrided: Non-Viable, Callus ?Level: Non-Viable Tissue ?Debridement Description: Selective/Open Wound ?Instrument: Curette ?Bleeding: Minimum ?Hemostasis Achieved: Pressure ?End Time: 11:59 ?Procedural Pain: 0 ?Post Procedural Pain: 0 ?Response to Treatment: Procedure was tolerated well ?Level of Consciousness (Post- Awake and Alert ?procedure): ?Post Debridement Measurements of Total Wound ?Length: (cm) 1.4 ?Width: (cm) 1 ?Depth: (cm) 0.3 ?Volume: (cm?) 0.33 ?Character of Wound/Ulcer Post Debridement: Improved ?Severity of Tissue Post Debridement: Fat layer exposed ?Post Procedure Diagnosis ?Same as Pre-procedure ?Electronic Signature(s) ?Signed:  03/08/2022 12:37:10 PM By: Fredirick Maudlin MD FACS ?Signed: 03/08/2022 5:45:24 PM By: Levan Hurst RN, BSN ?Entered By: Levan Hurst on 03/08/2022 12:03:56 ?-------------------------------------------------------------------------------- ?Debridement Details ?Patient Name: ?Date of Service: ?Jacob, [name] M R. 03/08/2022 11:00 A M ?Medical Record Number: [id number] ?Patient Account Number: [id number] ?Date of Birth/Sex: ?Treating RN: ?July 11, 1951 (71 y.o. Janyth Contes ?Primary Care Provider: Alain Honey Other Clinician: ?Referring Provider: ?Treating Provider/Extender: Fredirick Maudlin ?Alain Honey ?Weeks in Treatment: 1 ?Debridement Performed for Assessment: Wound #7 Left,Posterior  Upper Arm ?Performed By: Physician Fredirick Maudlin, MD ?Debridement Type: Debridement ?Level of Consciousness (Pre-procedure): Awake and Alert ?Pre-procedure Verification/Time Out Yes - 11:56 ?Taken: ?Start Time: 12:02 ?T Area Debrided (L x W): ?otal 1.8 (cm) x 1 (cm) = 1.8 (cm?) ?Tissue and other material debrided: Non-Viable, Eschar, [place], [place] ?Level: Non-Viable Tissue ?Debridement Description: Selective/Open Wound ?Instrument: Curette ?Bleeding: Minimum ?Hemostasis Achieved: Pressure ?End Time: 12:04 ?Procedural Pain: 0 ?Post Procedural Pain: 0 ?Response to Treatment: Procedure was tolerated well ?Level of Consciousness (Post- Awake and Alert ?procedure): ?Post Debridement Measurements of Total Wound ?Length: (cm) 1.8 ?Width: (cm) 1 ?Depth: (cm) 0.1 ?Volume: (cm?) 0.141 ?Character of Wound/Ulcer Post Debridement: Improved ?Post Procedure Diagnosis ?Same as Pre-procedure ?Electronic Signature(s) ?Signed: 03/08/2022 12:37:10 PM By: Fredirick Maudlin MD FACS ?Signed: 03/08/2022 5:45:24 PM By: Levan Hurst RN, BSN ?Entered By: Levan Hurst on 03/08/2022 12:04:48 ?-------------------------------------------------------------------------------- ?HPI Details ?Patient Name: Date of Service: ?Jacob, [name] M R. 03/08/2022  11:00 A M ?Medical Record Number: [id number] ?Patient Account Number: [id number] ?Date of Birth/Sex: Treating RN: ?July 11, 1951 (71 y.o. Jacob [name]) Dellie Catholic ?Primary Care Provider: Alain Honey Other Clinician: ?Referring Provider: ?Treating Provider/Extender: Fredirick Maudlin ?Alain Honey ?Weeks in Treatment: 1 ?History of Present Illness ?Location: 2 ulcers noted to the left hallux ?Quality: the patient denies pain ?Duration: these were originally blisters towards the end of February and opened in early March ?Context: these wounds are secondary to pressure; he got new shoes in February and also started wearing a toe cover/sleeve ?Modifying Factors: pressure and friction are exacerbating factor ?HPI Description: 01/12/17- Mr. Sones arrives for initial evaluation of 2 ulcers to his left hallux. He states that back in February he did receive a new pair of ?diabetic shoes and at that time he started wearing toe cover/sleeves and noticed development of a blister to the dorsal and plantar aspect. He has been seen ?at [facility] by both Dr. Amalia Hailey and Dr. Jacqualyn Posey. He last saw Dr.Wagoner on 3/8 at which time he was prescribed Silvadene and Keflex. He completed ?that dose of Keflex and states that he got a phone call to extend the Keflex prior to this appointment. He has a follow-up with Dr. Amalia Hailey on 3/26. He has been ?wearing a surgical shoe, no significant offloading to the plantar/medial ulcer but the strap does not extend over the dorsal ulcer. Topically he has been applying ?the Silvadene daily and a dry dressing. ?He is a diabetic, on insulin, with a recent A1c of 6.2% (11/10/16). He is a current smoker, 0.5 PPD. He has no known diagnosis of arterial insufficiency. ?01/20/17- The patient arrives for follow up evaluation of his ulcers to the left hallux. He admits to continuing to smoke approximately 0.5ppd. He states that his ?diet has been somewhat uncontrolled with higher than normal glucose levels, this  seems circumstantial as he had a death in the family as food and schedule ?has been disrupted. he admits to wearing the surgical show with offloading modifications while out of the home, but has been ambulating bar

## 2022-03-08 NOTE — Progress Notes (Signed)
Jacob, [name] ([id number]) ?Visit Report for 03/08/2022 ?Arrival Information Details ?Patient Name: Date of Service: ?Jacob [name], Jacob M R. 03/08/2022 11:00 A M ?Medical Record Number: [id number] ?Patient Account Number: [id number] ?Date of Birth/Sex: Treating RN: ?27-Dec-1950 (71 y.o. Mare Ferrari ?Primary Care Tymeir Weathington: Alain Honey Other Clinician: ?Referring Shams Fill: ?Treating Juelle Dickmann/Extender: Fredirick Maudlin ?Alain Honey ?Weeks in Treatment: 1 ?Visit Information History Since Last Visit ?Added or deleted any medications: No ?Patient Arrived: [name] ?Any new allergies or adverse reactions: No ?Arrival Time: 11:30 ?Had a fall or experienced change in No ?Accompanied By: self ?activities of daily living that may affect ?Transfer Assistance: None ?risk of falls: ?Patient Identification Verified: Yes ?Signs or symptoms of abuse/neglect since last visito No ?Secondary Verification Process Completed: Yes ?Hospitalized since last visit: No ?Patient Requires Transmission-Based Precautions: No ?Implantable device outside of the clinic excluding No ?Patient Has Alerts: No ?cellular tissue based products placed in the center ?since last visit: ?Has Dressing in Place as Prescribed: Yes ?Pain Present Now: No ?Electronic Signature(s) ?Signed: 03/08/2022 4:04:33 PM By: Sharyn Creamer RN, BSN ?Entered By: Sharyn Creamer on 03/08/2022 11:31:04 ?-------------------------------------------------------------------------------- ?Encounter Discharge Information Details ?Patient Name: Date of Service: ?Jacob [name], Jacob M R. 03/08/2022 11:00 A M ?Medical Record Number: [id number] ?Patient Account Number: [id number] ?Date of Birth/Sex: Treating RN: ?Sep 15, 1951 (71 y.o. Jacob [name]) Dellie Catholic ?Primary Care Krithik Mapel: Alain Honey Other Clinician: ?Referring Sneha Willig: ?Treating Ryer Asato/Extender: Fredirick Maudlin ?Alain Honey ?Weeks in Treatment: 1 ?Encounter Discharge Information Items Post Procedure Vitals ?Discharge Condition:  Stable ?Temperature (F): 97.9 ?Ambulatory Status: Ambulatory ?Pulse (bpm): 71 ?Discharge Destination: Home ?Respiratory Rate (breaths/min): 16 ?Transportation: Private Auto ?Blood Pressure (mmHg): 176/86 ?Accompanied By: self ?Schedule Follow-up Appointment: Yes ?Clinical Summary of Care: Patient Declined ?Electronic Signature(s) ?Signed: 03/08/2022 5:33:13 PM By: Dellie Catholic RN ?Entered By: Dellie Catholic on 03/08/2022 17:32:52 ?-------------------------------------------------------------------------------- ?Lower Extremity Assessment Details ?Patient Name: ?Date of Service: ?Jacob, [name] M R. 03/08/2022 11:00 A M ?Medical Record Number: [id number] ?Patient Account Number: [id number] ?Date of Birth/Sex: ?Treating RN: ?10-Dec-1950 (71 y.o. Mare Ferrari ?Primary Care Gwenetta Devos: Alain Honey ?Other Clinician: ?Referring Jameya Pontiff: ?Treating Juliana Boling/Extender: Fredirick Maudlin ?Alain Honey ?Weeks in Treatment: 1 ?Edema Assessment ?Assessed: [Left: No] [Right: Yes] ?E[Left: dema] [Right: :] ?Calf ?Left: Right: ?Point of Measurement: From Medial Instep 32.5 cm ?Ankle ?Left: Right: ?Point of Measurement: From Medial Instep 20 cm ?Vascular Assessment ?Pulses: ?Dorsalis Pedis ?Palpable: [Right:Yes] ?Electronic Signature(s) ?Signed: 03/08/2022 4:04:33 PM By: Sharyn Creamer RN, BSN ?Entered By: Sharyn Creamer on 03/08/2022 11:37:40 ?-------------------------------------------------------------------------------- ?Multi Wound Chart Details ?Patient Name: ?Date of Service: ?Jacob [name], Jacob M R. 03/08/2022 11:00 A M ?Medical Record Number: [id number] ?Patient Account Number: [id number] ?Date of Birth/Sex: ?Treating RN: ?12-02-1950 (71 y.o. Jacob [name]) Dellie Catholic ?Primary Care Teddi Badalamenti: Alain Honey ?Other Clinician: ?Referring Erlene Devita: ?Treating Naiyah Klostermann/Extender: Fredirick Maudlin ?Alain Honey ?Weeks in Treatment: 1 ?Vital Signs ?Height(in): 74 ?Pulse(bpm): 71 ?Weight(lbs): 172 ?Blood Pressure(mmHg): 176/86 ?Body Mass  Index(BMI): 22.1 ?Temperature(??F): 97.9 ?Respiratory Rate(breaths/min): 16 ?Photos: [5:Left Hand - 2nd Digit] [6:No Photos Left Hand - 3rd Digit] ?Wound Location: [5:Blister] [6:Blister] ?Wounding Event: [5:T be determined o] [6:T be determined] ?Primary Etiology: [5:Cataracts, Glaucoma, Anemia, Chronic Cataracts, Glaucoma, Anemia, Chronic Cataracts, Glaucoma, Anemia, Chronic] ?Comorbid History: [5:Obstructive Pulmonary Disease (COPD), Congestive Heart Failure, Coronary Artery Disease, Hypertension, Myocardial Infarction, Hypertension, Myocardial Infarction, Hypertension, Myocardial Infarction, Hepatitis C, Type II Diabetes,  ?Gout, Hepatitis C, Type II Diabetes, Gout, Hepatitis C, Type II Diabetes, Gout, Osteoarthritis, Neuropathy 11/09/2021] [6:Obstructive Pulmonary Disease (COPD), Congestive Heart Failure,  Coronary Artery Disease, Osteoarthritis, Neuropathy 11/09/2021] ?Date Acquired: [5:1] [6:1] ?Weeks of Treatment: [5:Open] [6:Open] ?Wound Status: [5:No] [6:No] ?Wound Recurrence: [5:1x1x0.1] [6:1.3x0.6x0.1] ?Measurements L x W x D (cm) [5:0.785] [6:0.613] ?A (cm?) : ?rea [5:0.079] [6:0.061] ?Volume (cm?) : [5:0.00%] [6:-85.80%] ?% Reduction in A [5:rea: 0.00%] [6:-84.80%] ?% Reduction in Volume: [5:Full Thickness Without Exposed] [6:Full Thickness Without Exposed] ?Classification: [5:Support Structures Medium] [6:Support Structures Medium] ?Exudate A mount: [5:Serosanguineous] [6:Serosanguineous] ?Exudate Type: [5:red, brown] [6:red, brown] ?Exudate Color: [5:Flat and Intact] [6:Flat and Intact] ?Wound Margin: [5:Large (67-100%)] [6:None Present (0%)] ?Granulation A mount: [5:Pink, Pale] [6:N/A] ?Granulation Quality: [5:Small (1-33%)] [6:Large (67-100%)] ?Necrotic A mount: ?[5:Fat Layer (Subcutaneous Tissue): Yes Fat Layer (Subcutaneous Tissue): Yes Fat Layer (Subcutaneous Tissue): Yes] ?Exposed Structures: ?[5:Fascia: No Tendon: No Muscle: No Joint: No Bone: No Large (67-100%)] [6:Fascia: No Tendon: No Muscle:  No Joint: No Bone: No Medium (34-66%)] ?Epithelialization: [5:Debridement - Selective/Open Wound N/A] ?Debridement: ?Pre-procedure Verification/Time Out 11:56 [6:N/A] ?Taken: [5:Necrotic/Eschar, Slough] [6:N/A] ?Tissue Debrided: [5:Non-Viable Tissue] [6:N/A] ?Level: [5:1] [6:N/A] ?Debridement A (sq cm): [5:rea Curette] [6:N/A] ?Instrument: [5:Minimum] [6:N/A] ?Bleeding: [5:Pressure] [6:N/A] ?Hemostasis A chieved: [5:0] [6:N/A] ?Procedural Pain: [5:0] [6:N/A] ?Post Procedural Pain: [5:Procedure was tolerated well] [6:N/A] ?Debridement Treatment Response: [5:1x1x0.1] [6:N/A] ?Post Debridement Measurements L x ?W x D (cm) [5:0.079] [6:N/A] ?Post Debridement Volume: (cm?) [5:N/A] [6:N/A] ?Assessment Notes: [5:Debridement] [6:N/A] ?Wound Number: 7 8 N/A ?Photos: N/A ?Left, Posterior Upper Arm Right, Plantar Foot N/A ?Wound Location: ?Blister Blister N/A ?Wounding Event: ?T be determined ?o Diabetic Wound/Ulcer of the Lower N/A ?Primary Etiology: ?Extremity ?Cataracts, Glaucoma, Anemia, Chronic Cataracts, Glaucoma, Anemia, Chronic N/A ?Comorbid History: ?Obstructive Pulmonary Disease Obstructive Pulmonary Disease ?(COPD), Congestive Heart Failure, (COPD), Congestive Heart Failure, ?Coronary Artery Disease, Coronary Artery Disease, ?Hypertension, Myocardial Infarction, Hypertension, Myocardial Infarction, ?Hepatitis C, Type II Diabetes, Gout, Hepatitis C, Type II Diabetes, Gout, ?Osteoarthritis, Neuropathy Osteoarthritis, Neuropathy ?11/09/2021 03/08/2022 N/A ?Date Acquired: ?1 0 N/A ?Weeks of Treatment: ?Open Open N/A ?Wound Status: ?No No N/A ?Wound Recurrence: ?1.8x1x0.1 1.4x1x0.3 N/A ?Measurements L x W x D (cm) ?1.414 1.1 N/A ?A (cm?) : ?rea ?0.141 0.33 N/A ?Volume (cm?) : ?59.10% -3448.40% N/A ?% Reduction in Area: ?59.20% -5400.00% N/A ?% Reduction in Volume: ?Full Thickness Without Exposed Grade 2 N/A ?Classification: ?Support Structures ?Medium Medium N/A ?Exudate Amount: ?Serosanguineous Serosanguineous  N/A ?Exudate Type: ?red, brown red, brown N/A ?Exudate Color: ?Flat and Intact Thickened N/A ?Wound Margin: ?Small (1-33%) None Present (0%) N/A ?Granulation Amount: ?Red N/A N/A ?Granulation Quality: ?Large (67-100

## 2022-03-09 ENCOUNTER — Ambulatory Visit: Payer: Medicare Other | Admitting: Internal Medicine

## 2022-03-09 ENCOUNTER — Other Ambulatory Visit (HOSPITAL_COMMUNITY): Payer: Self-pay | Admitting: Nephrology

## 2022-03-09 DIAGNOSIS — T8249XA Other complication of vascular dialysis catheter, initial encounter: Secondary | ICD-10-CM | POA: Diagnosis not present

## 2022-03-09 DIAGNOSIS — N186 End stage renal disease: Secondary | ICD-10-CM

## 2022-03-09 DIAGNOSIS — Z992 Dependence on renal dialysis: Secondary | ICD-10-CM | POA: Diagnosis not present

## 2022-03-09 DIAGNOSIS — N2581 Secondary hyperparathyroidism of renal origin: Secondary | ICD-10-CM | POA: Diagnosis not present

## 2022-03-09 DIAGNOSIS — D631 Anemia in chronic kidney disease: Secondary | ICD-10-CM | POA: Diagnosis not present

## 2022-03-09 DIAGNOSIS — D689 Coagulation defect, unspecified: Secondary | ICD-10-CM | POA: Diagnosis not present

## 2022-03-10 ENCOUNTER — Encounter: Payer: Self-pay | Admitting: Internal Medicine

## 2022-03-10 ENCOUNTER — Ambulatory Visit (INDEPENDENT_AMBULATORY_CARE_PROVIDER_SITE_OTHER): Payer: Medicare Other | Admitting: Internal Medicine

## 2022-03-10 ENCOUNTER — Other Ambulatory Visit: Payer: Self-pay

## 2022-03-10 DIAGNOSIS — A048 Other specified bacterial intestinal infections: Secondary | ICD-10-CM

## 2022-03-10 DIAGNOSIS — B20 Human immunodeficiency virus [HIV] disease: Secondary | ICD-10-CM

## 2022-03-10 DIAGNOSIS — E1122 Type 2 diabetes mellitus with diabetic chronic kidney disease: Secondary | ICD-10-CM | POA: Diagnosis not present

## 2022-03-10 DIAGNOSIS — I10 Essential (primary) hypertension: Secondary | ICD-10-CM | POA: Diagnosis not present

## 2022-03-10 DIAGNOSIS — L98499 Non-pressure chronic ulcer of skin of other sites with unspecified severity: Secondary | ICD-10-CM | POA: Diagnosis not present

## 2022-03-10 MED ORDER — BIKTARVY 50-200-25 MG PO TABS: 1.0000 | ORAL_TABLET | Freq: Every day | ORAL | 11 refills | Status: DC

## 2022-03-10 NOTE — Progress Notes (Signed)
Patient Active Problem List   Diagnosis Date Noted   Helicobacter pylori (H. pylori) infection 04/06/2021    Priority: High   Human immunodeficiency virus (HIV) disease ([place]) 09/02/2006    Priority: High   Chronic lower back pain    Thrombocytopenia (HCC)    Hypercalcemia 04/13/2021   Lumbar foraminal stenosis 03/30/2021   Gastric hemorrhage due to angiodysplasia of stomach    GI bleed 11/24/2020   COVID-19 virus infection 11/24/2020   Other mechanical complication of cranial or spinal infusion catheter, sequela 11/11/2020   Subdural hematoma ([place]) 11/06/2020   Fall at home, initial encounter 11/05/2020   Nicotine dependence, cigarettes, uncomplicated 93/79/0240   Alcohol abuse 11/05/2020   Unspecified trochanteric fracture of left femur, initial encounter for closed fracture ([place]) 11/05/2020   Traumatic subdural hematoma, initial encounter ([place]) 11/05/2020   Fracture of metatarsal of right foot, closed 11/05/2020   Nondisplaced fracture of greater trochanter of left femur, initial encounter for closed fracture ([place])    Uremia    Angiodysplasia of intestine with hemorrhage    Pruritus, unspecified 10/02/2020   Iron deficiency anemia    Acute blood loss anemia    Acute on chronic anemia    Abnormal nuclear stress test 12/19/2019   Type 2 diabetes mellitus with diabetic neuropathy, unspecified ([place]) 08/07/2019   Allergy, unspecified, initial encounter 97/35/3299   Complication of vascular dialysis catheter 08/02/2019   Drug abuse counseling and surveillance of drug abuser 08/02/2019   Other specified coagulation defects ([place]) 08/02/2019   Secondary hyperparathyroidism of renal origin ([place]) 08/02/2019   Unspecified atrial fibrillation ([place]) 08/02/2019   ESRD on hemodialysis ([place])    Cocaine abuse ([place])    CHF exacerbation ([place]) 07/21/2019   Diabetic foot ulcer ([place]) 02/11/2019   AVM (arteriovenous malformation)    Melena 02/07/2019   Symptomatic anemia  02/06/2019   Hyperlipidemia associated with type 2 diabetes mellitus ([place]) 05/30/2018   Right foot ulcer ([place]) 02/28/2018   Chronic obstructive pulmonary disease ([place]) 02/28/2018   Anemia of chronic disease 11/20/2016   CHF (congestive heart failure) ([place]) 11/10/2016   History of lumbar laminectomy for spinal cord decompression 02/29/2016   Type 2 diabetes mellitus with diabetic polyneuropathy, with long-term current use of insulin ([place]) 06/17/2015   HTN (hypertension) 04/26/2015   DM (diabetes mellitus) ([place]) 04/26/2015   Ischemic cardiomyopathy 05/12/2014   Bunion of left foot 11/25/2011   Bunion, right foot 11/25/2011   Polysubstance abuse ([place]) 07/28/2011   Hip fracture, left ([place]) 04/04/2011   Closed fracture of neck of femur ([place]) 04/04/2011   Insomnia 02/18/2011   Coronary artery disease involving native heart without angina pectoris 04/20/2009   Chronic combined systolic (congestive) and diastolic (congestive) heart failure ([place]) 04/20/2009   Mixed hyperlipidemia 11/20/2006   Gout 11/20/2006   TOBACCO ABUSE 11/20/2006   Essential hypertension 11/20/2006    Patient's Medications  New Prescriptions   No medications on file  Previous Medications   ACETAMINOPHEN (TYLENOL) 650 MG CR TABLET    Take 650 mg by mouth 3 (three) times daily.   ALLOPURINOL (ZYLOPRIM) 100 MG TABLET    TAKE 1 TABLET(100 MG) BY MOUTH DAILY   ANORO ELLIPTA 62.5-25 MCG/INH AEPB    INHALE 1 PUFF BY MOUTH EVERY DAY   ATORVASTATIN (LIPITOR) 10 MG TABLET    TAKE 1 TABLET(10 MG) BY MOUTH DAILY   B-D UF III MINI PEN NEEDLES 31G X 5 MM MISC    USE  FOUR TIMES DAILY   CALCIUM ACETATE 667 MG TABS    Take 667-1,334 mg by mouth See admin instructions. Take 1,334 mg by mouth three times a day with meals and 667 mg with each snack   CARVEDILOL (COREG) 3.125 MG TABLET    TAKE 1 TABLET(3.125 MG) BY MOUTH TWICE DAILY   COLCHICINE 0.6 MG TABLET    TAKE 1 TABLET BY MOUTH AS NEEDED FOR GOUT   DICLOFENAC SODIUM (VOLTAREN) 1 %  GEL    APPLY 2 GRAMS TOPICALLY TO THE AFFECTED AREA THREE TIMES DAILY AS NEEDED FOR PAIN   FOLIC ACID (FOLVITE) 1 MG TABLET    TAKE 1 TABLET(1 MG) BY MOUTH DAILY   GABAPENTIN (NEURONTIN) 300 MG CAPSULE    TAKE 1 CAPSULE BY MOUTH DAILY. DOSE CAHNGE DUE TO RENAL FUNCTION   GLUCOSE BLOOD (ONETOUCH ULTRA) TEST STRIP    Check blood sugar three times daily E11.40   INSULIN LISPRO (HUMALOG) 100 UNIT/ML KWIKPEN    INJECT 13 UNITS UNDER THE SKIN THREE TIMES DAILY AS NEEDED FOR HIGH BLOOD SUGAR(ABOVE 150)   ISOSORBIDE MONONITRATE (IMDUR) 30 MG 24 HR TABLET    TAKE 1 TABLET BY MOUTH EVERY DAY   LANCETS (ONETOUCH DELICA PLUS [id number]) MISC    1 Device by Other route 3 (three) times daily. E11.42   LATANOPROST (XALATAN) 0.005 % OPHTHALMIC SOLUTION    Place 1 drop into both eyes at bedtime.   LIDOCAINE-PRILOCAINE (EMLA) CREAM    Apply 1 application topically every Monday, Wednesday, and Friday with hemodialysis.   METHOXY PEG-EPOETIN BETA (MIRCERA IJ)    Mircera   MULTIVITAMIN (RENA-VIT) TABS TABLET    Take 1 tablet by mouth daily.   NITROGLYCERIN (NITROSTAT) 0.3 MG SL TABLET    PLACE 1 TABLET UNDER THE TONGUE AS NEEDED FOR CHEST PAIN   OCTREOTIDE (SANDOSTATIN LAR DEPOT) 10 MG INJECTION    Inject 10 mg into the muscle every 28 days.   ONDANSETRON (ZOFRAN) 4 MG TABLET    Take 1 tablet (4 mg total) by mouth every 8 (eight) hours as needed for nausea or vomiting.   OXYCODONE-ACETAMINOPHEN (PERCOCET/ROXICET) 5-325 MG TABLET    Take 1 tablet by mouth every 4 (four) hours as needed for severe pain.   POLYETHYLENE GLYCOL (MIRALAX / GLYCOLAX) 17 G PACKET    Take 17 g by mouth daily as needed for mild constipation.   TIMOLOL (TIMOPTIC) 0.5 % OPHTHALMIC SOLUTION    Place 1 drop into both eyes in the morning.  Modified Medications   Modified Medication Previous Medication   BIKTARVY 50-200-25 MG TABS TABLET BIKTARVY 50-200-25 MG TABS tablet      Take 1 tablet by mouth daily.    Take 1 tablet by mouth daily.   Discontinued Medications   No medications on file    Subjective: Jacob [name] is in for his routine HIV follow-up visit.  He denies any problems obtaining, taking or tolerating his Biktarvy and does not recall missing doses.  He takes it midday so that its after he finishes dialysis.  He recently had his left arm fistula ligated because he had developed some nonhealing skin lesions on his left forearm and fingers.  The wounds are healing slowly without evidence of infection.  He has been dialyzed through a catheter now but says that blood flow has been sluggish and he believes he will have to have another catheter placed soon.  He stopped taking pantoprazole because he felt that it caused him to be constipated.  He is not having problems with acid indigestion.  He says that he has not noticed any changes to suggest further GI bleeding.  He recently moved into a new, found floor apartment and is very happy.  Review of Systems: Review of Systems  Constitutional:  Negative for fever.  Gastrointestinal:  Negative for blood in stool and heartburn.   Past Medical History:  Diagnosis Date   Acute respiratory failure ([place]) 03/01/2018   Anemia    Arthritis    "all over; mostly knees and back" (02/28/2018)   Chronic combined systolic and diastolic CHF (congestive heart failure) (HCC)    Chronic lower back pain    stenosis   Community acquired pneumonia 09/06/2013   COPD (chronic obstructive pulmonary disease) ([place])    Coronary atherosclerosis of native coronary artery    a. 02/2003 s/p CABG x 2 (VG->RI, VG->RPDA; b. 11/2019 PCI: LM nl, LAD 90d, D3 50, RI 100, LCX 100p, OM3 100 - fills via L->L collats from D2/dLAD, RCA 100p, VG->RPDA ok, VG->RI 95 (3.5x48 Synergy XD DES).   Drug abuse ([place])    hx; tested for cocaine as recently as 2/08. says he is not using drugs now - avoided defib. for this reason    ESRD (end stage renal disease) ([place])    Hemo M-W-F- [facility]   Fall at home 10/2020   GERD (gastroesophageal  reflux disease)    takes OTC meds as needed   GI bleeding    a. 11/2019 EGD: angiodysplastic lesions w/ bleeding s/p argon plasma/clipping/epi inj. Multiple admissions for the same.   Glaucoma    uses eye drops daily   Hepatitis B 1968   "tx'd w/isolation; caught it from toilet stools in gym"   History of blood transfusion 03/01/2019   History of colon polyps    benign   History of gout    takes Allopurinol daily as well as Colchicine-if needed (02/28/2018)   History of kidney stones    HTN (hypertension)    takes Coreg,Imdur.and Apresoline daily   Human immunodeficiency virus (HIV) disease ([place]) dx'd 1995   on [place] as of 12/2020.     Hyperlipidemia    Ischemic cardiomyopathy    a. 01/2019 Echo: EF 40-45%, diffuse HK, mild basal septal hypertrophy. Diast dysfxn. Nl RV size/fxn. Sev dil LA. Triv MR/TR/PR.   Muscle spasm    takes Zanaflex as needed   Myocardial infarction ([place]) ~ 2004/2005   Nocturia    Peripheral neuropathy    takes gabapentin daily   Pneumonia    "at least twice" (02/28/2018)   SDH (subdural hematoma) (HCC)    Syphilis, unspecified    Type II diabetes mellitus ([place]) 2004   Lantus daily.Average fasting blood sugar 125-199   Wears glasses    Wears partial dentures     Social History   Tobacco Use   Smoking status: Every Day    Packs/day: 0.50    Years: 43.00    Pack years: 21.50    Types: Cigarettes   Smokeless tobacco: Never  Vaping Use   Vaping Use: Never used  Substance Use Topics   Alcohol use: Yes    Alcohol/week: 12.0 standard drinks    Types: 12 Standard drinks or equivalent per week    Comment: occassional   Drug use: Not Currently    Types: Cocaine    Comment: hx of crack/cocaine 72yr ago 10/01/2019- none    Family History  Problem Relation Age of Onset   Heart failure Father  Hypertension Father    Diabetes Brother    Heart attack Brother    Alzheimer's disease Mother    Stroke Sister    Diabetes Sister    Alzheimer's disease  Sister    Hypertension Brother    Diabetes Brother    Drug abuse Brother    Colon cancer Neg Hx     Allergies  Allergen Reactions   Augmentin [Amoxicillin-Pot Clavulanate] Diarrhea and Other (See Comments)    Severe diarrhea   Mucinex Fast-Max Other (See Comments)    Intense sweating    Amphetamines Other (See Comments)    Unknown reaction    Health Maintenance  Topic Date Due   HEMOGLOBIN A1C  02/01/2022   INFLUENZA VACCINE  05/24/2022   COLONOSCOPY (Pts 45-38yr Insurance coverage will need to be confirmed)  10/01/2022   OPHTHALMOLOGY EXAM  10/05/2022   FOOT EXAM  11/16/2022   LIPID PANEL  02/25/2023   TETANUS/TDAP  06/13/2029   Pneumonia Vaccine 71 Years old  Completed   COVID-19 Vaccine  Completed   Hepatitis C Screening  Completed   Zoster Vaccines- Shingrix  Completed   HPV VACCINES  Aged Out    Objective:  Vitals:   03/10/22 1032  BP: (!) 172/84  Pulse: 69  Temp: 98.1 F (36.7 C)  TempSrc: Temporal  SpO2: 97%  Weight: 174 lb (78.9 kg)   Body mass index is 24.27 kg/m.  Physical Exam Constitutional:      Comments: He is in good spirits.  Cardiovascular:     Rate and Rhythm: Normal rate and regular rhythm.     Heart sounds: No murmur heard. Pulmonary:     Effort: Pulmonary effort is normal.     Breath sounds: Normal breath sounds.  Psychiatric:        Mood and Affect: Mood normal.    Lab Results Lab Results  Component Value Date   WBC 4.0 02/24/2022   HGB 11.6 (L) 02/24/2022   HCT 34.9 (L) 02/24/2022   MCV 103.9 (H) 02/24/2022   PLT 125 (L) 02/24/2022    Lab Results  Component Value Date   CREATININE 6.53 (H) 02/24/2022   BUN 29 (H) 02/24/2022   NA 134 (L) 02/24/2022   K 4.6 02/24/2022   CL 92 (L) 02/24/2022   CO2 31 02/24/2022    Lab Results  Component Value Date   ALT 11 02/24/2022   AST 23 02/24/2022   ALKPHOS 55 01/21/2022   BILITOT 0.4 02/24/2022    Lab Results  Component Value Date   CHOL 154 02/24/2022   HDL 34 (L)  02/24/2022   LDLCALC 80 02/24/2022   TRIG 332 (H) 02/24/2022   CHOLHDL 4.5 02/24/2022   Lab Results  Component Value Date   LABRPR NON-REACTIVE 02/24/2022   RPRTITER 1:1 (H) 06/30/2020   HIV 1 RNA Quant  Date Value  02/24/2022 NOT DETECTED copies/mL  04/06/2021 <20 Copies/mL (H)  06/30/2020 <20 Copies/mL   CD4 T Cell Abs (/uL)  Date Value  02/24/2022 315 (L)  04/06/2021 406  06/30/2020 403     Problem List Items Addressed This Visit       High   Human immunodeficiency virus (HIV) disease ([place] (Chronic)    His infection has been under excellent, long-term control.  He will continue Biktarvy and follow-up after lab work in 1 year.       Relevant Medications   BIKTARVY 50-200-25 MG TABS tablet   Other Relevant Orders   T-helper cells (CD4)  count (not at [facility])   RPR   HIV-1 RNA quant-no reflex-bld   Helicobacter pylori (H. pylori) infection    His Helicobacter infection persisted despite 3 separate rounds of antibiotic therapy last year.  He remains unwilling to consider further treatment at this time.       Relevant Medications   BIKTARVY 50-200-25 MG TABS tablet      [name], MD [facility] [phone] pager   [phone] cell 03/10/2022, 10:53 AM

## 2022-03-10 NOTE — Assessment & Plan Note (Signed)
His infection has been under excellent, long-term control.  He will continue Biktarvy and follow-up after lab work in 1 year.

## 2022-03-10 NOTE — Assessment & Plan Note (Signed)
His Helicobacter infection persisted despite 3 separate rounds of antibiotic therapy last year.  He remains unwilling to consider further treatment at this time.

## 2022-03-11 DIAGNOSIS — D689 Coagulation defect, unspecified: Secondary | ICD-10-CM | POA: Diagnosis not present

## 2022-03-11 DIAGNOSIS — N186 End stage renal disease: Secondary | ICD-10-CM | POA: Diagnosis not present

## 2022-03-11 DIAGNOSIS — D631 Anemia in chronic kidney disease: Secondary | ICD-10-CM | POA: Diagnosis not present

## 2022-03-11 DIAGNOSIS — T8249XA Other complication of vascular dialysis catheter, initial encounter: Secondary | ICD-10-CM | POA: Diagnosis not present

## 2022-03-11 DIAGNOSIS — Z992 Dependence on renal dialysis: Secondary | ICD-10-CM | POA: Diagnosis not present

## 2022-03-11 DIAGNOSIS — N2581 Secondary hyperparathyroidism of renal origin: Secondary | ICD-10-CM | POA: Diagnosis not present

## 2022-03-12 ENCOUNTER — Other Ambulatory Visit: Payer: Self-pay | Admitting: Physician Assistant

## 2022-03-12 DIAGNOSIS — N186 End stage renal disease: Secondary | ICD-10-CM

## 2022-03-12 MED ORDER — VANCOMYCIN HCL 10 G IV SOLR: 1000.0000 mg | Freq: Once | INTRAVENOUS | Status: DC

## 2022-03-14 ENCOUNTER — Other Ambulatory Visit: Payer: Self-pay

## 2022-03-14 ENCOUNTER — Other Ambulatory Visit (HOSPITAL_COMMUNITY): Payer: Self-pay | Admitting: Nephrology

## 2022-03-14 ENCOUNTER — Other Ambulatory Visit: Payer: Self-pay | Admitting: *Deleted

## 2022-03-14 ENCOUNTER — Ambulatory Visit (HOSPITAL_COMMUNITY)
Admission: RE | Admit: 2022-03-14 | Discharge: 2022-03-14 | Disposition: A | Discharge status: Home / Self Care | Payer: Medicare Other | Source: Ambulatory Visit | Attending: Nephrology | Admitting: Nephrology

## 2022-03-14 DIAGNOSIS — T8249XA Other complication of vascular dialysis catheter, initial encounter: Secondary | ICD-10-CM | POA: Diagnosis not present

## 2022-03-14 DIAGNOSIS — Z4901 Encounter for fitting and adjustment of extracorporeal dialysis catheter: Secondary | ICD-10-CM | POA: Insufficient documentation

## 2022-03-14 DIAGNOSIS — N186 End stage renal disease: Secondary | ICD-10-CM

## 2022-03-14 DIAGNOSIS — D689 Coagulation defect, unspecified: Secondary | ICD-10-CM | POA: Diagnosis not present

## 2022-03-14 DIAGNOSIS — N2581 Secondary hyperparathyroidism of renal origin: Secondary | ICD-10-CM | POA: Diagnosis not present

## 2022-03-14 DIAGNOSIS — Z992 Dependence on renal dialysis: Secondary | ICD-10-CM | POA: Diagnosis not present

## 2022-03-14 DIAGNOSIS — M48061 Spinal stenosis, lumbar region without neurogenic claudication: Secondary | ICD-10-CM

## 2022-03-14 DIAGNOSIS — D631 Anemia in chronic kidney disease: Secondary | ICD-10-CM | POA: Diagnosis not present

## 2022-03-14 HISTORY — PX: IR FLUORO GUIDE CV LINE LEFT: IMG2282

## 2022-03-14 MED ORDER — LIDOCAINE HCL 1 % IJ SOLN
INTRAMUSCULAR | Status: AC
  Administered 2022-03-14: 10 mL
  Filled 2022-03-14: qty 20

## 2022-03-14 MED ORDER — CEFAZOLIN SODIUM-DEXTROSE 2-4 GM/100ML-% IV SOLN
INTRAVENOUS | Status: AC
  Administered 2022-03-14: 2 g via INTRAVENOUS
  Filled 2022-03-14: qty 100

## 2022-03-14 MED ORDER — HEPARIN SODIUM (PORCINE) 1000 UNIT/ML IJ SOLN
INTRAMUSCULAR | Status: AC
  Administered 2022-03-14: 4200 [IU] via INTRAVENOUS_CENTRAL
  Filled 2022-03-14: qty 10

## 2022-03-14 MED ORDER — CHLORHEXIDINE GLUCONATE 4 % EX LIQD
CUTANEOUS | Status: AC
  Filled 2022-03-14: qty 15

## 2022-03-14 MED ORDER — CEFAZOLIN SODIUM-DEXTROSE 2-4 GM/100ML-% IV SOLN: 2.0000 g | INTRAVENOUS | Status: AC

## 2022-03-14 NOTE — Telephone Encounter (Signed)
Patient requested refill.  Epic LR: 02/22/2022 Contract Date: 07/20/2021 Pended Rx and sent to Dr. Sabra Heck for approval.

## 2022-03-14 NOTE — Procedures (Signed)
Interventional Radiology Procedure Note  Procedure: Left IJ Tunneled HD Catheter  Indication: Non functional Tunneled HD Catheter  Findings: Please refer to procedural dictation for full description.  Complications: None  EBL: < 10 mL  Miachel Roux, MD [id number]

## 2022-03-15 ENCOUNTER — Encounter (HOSPITAL_BASED_OUTPATIENT_CLINIC_OR_DEPARTMENT_OTHER): Payer: Medicare Other | Admitting: General Surgery

## 2022-03-15 DIAGNOSIS — I255 Ischemic cardiomyopathy: Secondary | ICD-10-CM | POA: Diagnosis not present

## 2022-03-15 DIAGNOSIS — Z992 Dependence on renal dialysis: Secondary | ICD-10-CM | POA: Diagnosis not present

## 2022-03-15 DIAGNOSIS — F17210 Nicotine dependence, cigarettes, uncomplicated: Secondary | ICD-10-CM | POA: Diagnosis not present

## 2022-03-15 DIAGNOSIS — N2581 Secondary hyperparathyroidism of renal origin: Secondary | ICD-10-CM | POA: Diagnosis not present

## 2022-03-15 DIAGNOSIS — Z794 Long term (current) use of insulin: Secondary | ICD-10-CM | POA: Diagnosis not present

## 2022-03-15 DIAGNOSIS — L97512 Non-pressure chronic ulcer of other part of right foot with fat layer exposed: Secondary | ICD-10-CM | POA: Diagnosis not present

## 2022-03-15 DIAGNOSIS — E1122 Type 2 diabetes mellitus with diabetic chronic kidney disease: Secondary | ICD-10-CM | POA: Diagnosis not present

## 2022-03-15 DIAGNOSIS — J449 Chronic obstructive pulmonary disease, unspecified: Secondary | ICD-10-CM | POA: Diagnosis not present

## 2022-03-15 DIAGNOSIS — I5042 Chronic combined systolic (congestive) and diastolic (congestive) heart failure: Secondary | ICD-10-CM | POA: Diagnosis not present

## 2022-03-15 DIAGNOSIS — E11621 Type 2 diabetes mellitus with foot ulcer: Secondary | ICD-10-CM | POA: Diagnosis not present

## 2022-03-15 DIAGNOSIS — N186 End stage renal disease: Secondary | ICD-10-CM | POA: Diagnosis not present

## 2022-03-15 DIAGNOSIS — I132 Hypertensive heart and chronic kidney disease with heart failure and with stage 5 chronic kidney disease, or end stage renal disease: Secondary | ICD-10-CM | POA: Diagnosis not present

## 2022-03-15 DIAGNOSIS — L98499 Non-pressure chronic ulcer of skin of other sites with unspecified severity: Secondary | ICD-10-CM | POA: Diagnosis not present

## 2022-03-15 DIAGNOSIS — L98492 Non-pressure chronic ulcer of skin of other sites with fat layer exposed: Secondary | ICD-10-CM | POA: Diagnosis not present

## 2022-03-15 MED ORDER — OXYCODONE-ACETAMINOPHEN 5-325 MG PO TABS: 1.0000 | ORAL_TABLET | ORAL | 0 refills | Status: DC | PRN

## 2022-03-15 NOTE — Progress Notes (Signed)
[name], Jacob ([id number]) Visit Report for 03/15/2022 Chief Complaint Document Details Patient Name: Date of Service: Jacob [name], Jacob [name] 03/15/2022 12:30 PM Medical Record Number: [id number] Patient Account Number: [id number] Date of Birth/Sex: Treating RN: 09/03/1951 (71 y.o. Jacob [name] Primary Care Provider: Alain Honey Other Clinician: Referring Provider: Treating Provider/Extender: Neil Crouch in Treatment: 2 Information Obtained from: Patient Chief Complaint Patient seen for complaints of Non-Healing Wounds x 3 Electronic Signature(s) Signed: 03/15/2022 1:39:40 PM By: Fredirick Maudlin MD FACS Entered By: Fredirick Maudlin on 03/15/2022 13:39:40 -------------------------------------------------------------------------------- Debridement Details Patient Name: Date of Service: Jacob [name]. 03/15/2022 12:30 PM Medical Record Number: [id number] Patient Account Number: [id number] Date of Birth/Sex: Treating RN: May 10, 1951 (71 y.o. Jacob [name] Primary Care Provider: Alain Honey Other Clinician: Referring Provider: Treating Provider/Extender: Neil Crouch in Treatment: 2 Debridement Performed for Assessment: Wound #7 Left,Posterior Upper Arm Performed By: Physician Fredirick Maudlin, MD Debridement Type: Debridement Level of Consciousness (Pre-procedure): Awake and Alert Pre-procedure Verification/Time Out Yes - 13:17 Taken: Start Time: 13:17 Pain Control: Other : Benzocaine 20% T Area Debrided (L x W): otal 1 (cm) x 0.8 (cm) = 0.8 (cm) Tissue and other material debrided: Non-Viable, Eschar, Slough, Slough Level: Non-Viable Tissue Debridement Description: Selective/Open Wound Instrument: Curette Bleeding: Minimum Hemostasis Achieved: Pressure End Time: 13:18 Procedural Pain: 0 Post Procedural Pain: 0 Response to Treatment: Procedure was tolerated well Level of Consciousness (Post- Awake and  Alert procedure): Post Debridement Measurements of Total Wound Length: (cm) 1 Width: (cm) 0.8 Depth: (cm) 0.1 Volume: (cm) 0.063 Character of Wound/Ulcer Post Debridement: Improved Post Procedure Diagnosis Same as Pre-procedure Electronic Signature(s) Signed: 03/15/2022 1:49:21 PM By: Fredirick Maudlin MD FACS Signed: 03/15/2022 5:59:48 PM By: Dellie Catholic RN Entered By: Dellie Catholic on 03/15/2022 13:18:33 -------------------------------------------------------------------------------- Debridement Details Patient Name: Date of Service: Jacob [name]. 03/15/2022 12:30 PM Medical Record Number: [id number] Patient Account Number: [id number] Date of Birth/Sex: Treating RN: 1951/04/05 (71 y.o. Jacob [name] Primary Care Provider: Alain Honey Other Clinician: Referring Provider: Treating Provider/Extender: Neil Crouch in Treatment: 2 Debridement Performed for Assessment: Wound #8 Right,Plantar Foot Performed By: Physician Fredirick Maudlin, MD Debridement Type: Debridement Severity of Tissue Pre Debridement: Fat layer exposed Level of Consciousness (Pre-procedure): Awake and Alert Pre-procedure Verification/Time Out Yes - 13:17 Taken: Start Time: 13:17 Pain Control: Other : Benzocaine 20% T Area Debrided (L x W): otal 0.5 (cm) x 0.5 (cm) = 0.25 (cm) Tissue and other material debrided: Non-Viable, Callus, Slough, Subcutaneous, Slough Level: Skin/Subcutaneous Tissue Debridement Description: Excisional Instrument: Curette Bleeding: Minimum Hemostasis Achieved: Pressure End Time: 13:18 Procedural Pain: 0 Post Procedural Pain: 0 Response to Treatment: Procedure was tolerated well Level of Consciousness (Post- Awake and Alert procedure): Post Debridement Measurements of Total Wound Length: (cm) 0.5 Width: (cm) 0.5 Depth: (cm) 0.3 Volume: (cm) 0.059 Character of Wound/Ulcer Post Debridement: Improved Severity of Tissue Post  Debridement: Fat layer exposed Post Procedure Diagnosis Same as Pre-procedure Electronic Signature(s) Signed: 03/15/2022 1:49:21 PM By: Fredirick Maudlin MD FACS Signed: 03/15/2022 5:59:48 PM By: Dellie Catholic RN Entered By: Dellie Catholic on 03/15/2022 13:19:45 -------------------------------------------------------------------------------- HPI Details Patient Name: Date of Service: Jacob Katos R. 03/15/2022 12:30 PM Medical Record Number: [id number] Patient Account Number: [id number] Date of Birth/Sex: Treating RN: [birth year] (71 y.o. Jacob [name] Primary Care Provider: Alain Honey Other Clinician: Referring Provider: Treating Provider/Extender: Neil Crouch in Treatment: 2 History  of Present Illness Location: 2 ulcers noted to the left hallux Quality: the patient denies pain Duration: these were originally blisters towards the end of February and opened in early March Context: these wounds are secondary to pressure; he got new shoes in February and also started wearing a toe cover/sleeve Modifying Factors: pressure and friction are exacerbating factor HPI Description: 01/12/17- Mr. Sult arrives for initial evaluation of 2 ulcers to his left hallux. He states that back in February he did receive a new pair of diabetic shoes and at that time he started wearing toe cover/sleeves and noticed development of a blister to the dorsal and plantar aspect. He has been seen at [facility] by both Dr. Amalia Hailey and Dr. Jacqualyn Posey. He last saw Dr.Wagoner on 3/8 at which time he was prescribed Silvadene and Keflex. He completed that dose of Keflex and states that he got a phone call to extend the Keflex prior to this appointment. He has a follow-up with Dr. Amalia Hailey on 3/26. He has been wearing a surgical shoe, no significant offloading to the plantar/medial ulcer but the strap does not extend over the dorsal ulcer. Topically he has been applying the Silvadene  daily and a dry dressing. He is a diabetic, on insulin, with a recent A1c of 6.2% (11/10/16). He is a current smoker, 0.5 PPD. He has no known diagnosis of arterial insufficiency. 01/20/17- The patient arrives for follow up evaluation of his ulcers to the left hallux. He admits to continuing to smoke approximately 0.5ppd. He states that his diet has been somewhat uncontrolled with higher than normal glucose levels, this seems circumstantial as he had a death in the family as food and schedule has been disrupted. he admits to wearing the surgical show with offloading modifications while out of the home, but has been ambulating barefoot around the home. he saw Dr. Amalia Hailey yesterday for toenail trimming and callus shaving to the right plantar foot and investigation of his ulcers. 01/26/17- he is here for follow-up evaluation of his left hallux ulcers. He states he has been significantly more compliant in the use of his offloading surgical shoe, with the exception of "getting up for a glass of water" he states that he has worn it with any ambulation. He states he has decreased his smoking too, admitting to only a cigarette since yesterday. He states his blood sugar this morning was 147 and yesterday was 116. He is voicing no complaints or concerns 02/02/17- He is here for follow-up evaluation of his left hallux ulcers. The dorsal ulcer is crusted over/healed. He states he has minimized his ambulation over this past week, much less than the previous week. He states his glucose levels have been between 120-150 about half of the time, otherwise greater than 150. He cannot correlate any specific dietary changes. He does have an appointment with a dietitian next month. He was advised to keep a log of his glucose levels and his diet for that appointment. 02/06/17; patient comes in today for first application of the total contact cast. Her intake nurse reported the wounds look better therefore I did not actually  see these before application of the cast. He'll return on Thursday for reapplication in the standard fashion 02/10/27- he is here for follow-up evaluation of his left plantar hallux ulcer. He came in Monday for application of a total contact cast. He states that he was tolerating that Monday, but noticed on Tuesday that his foot began to slide in the cast. He also is  complaining of the heaviness of the cast, as he ambulates as his primary form of transportation. He continues to smoke,. He has not contacted his cardiologist regarding Nicorette gum. He states his blood sugars have been slightly elevated, this morning being 206. He states that he will complete an antibiotic tonight. He states this was prescribed by Dr. Jacqualyn Posey. He states that he has refilled this prescription twice, without direction to continue taking this medication. 02/16/17; patient wound about the same as last week per her description. Rolled edges of callus scan and nonviable subcutaneous tissue. Using Prisma. Apparently used topical antibiotic instead of the Prisma over the course of this week. He has not a total contact cast candidate secondary to ambulation needs and unsteadiness on his feet per discussion with our intake nurse 03/02/17 patient requires debridement. He has a small wound on the right great toe which probes however does not approach bone. What I can see of the base of this wound looks reasonably healthy and I think this is largely in offloading issue with the second toe crossing over the first. He did not tolerate a total contact cast has balance issues 5/171/8. 0.2x0.2x0.2 less depth. Using prisma 03/16/17; no major change. Still a small probing wound with copious amounts of surrounding callus skin nonviable subcutaneous tissue all of which debrided. Still using Prisma. I suspect the dimensions post debridement are about the same as listed above 0.2 x 0.2 x 0.2 03/23/17; no major change using Prisma. Once again a  large amount of callus nonviable subcutaneous tissue around the wound. I went over the offloading issues with the patient and the nurse. He did not tolerate a total contact cast he uses public transportation among other issues. 03/30/17; no major change using Prisma. Once again a large amount of callus and nonviable tissue around the wound requires debridement. Saw Dr. Amalia Hailey who pared calluses on the other foot. 04/06/17; the wound is much smaller there is callus around this although I think I can see the base of the wound and I elected not to do any debridement. 04/13/17 this is a wound I did not debridement last week left callus intact. He arrives today with the area much in the same predicament however this time I remove the callus to find a deep probing hole. This is disappointing this is not go to bone. We have been using Prisma. 04/20/17 plantar left first toe. Again although a lot of circumferential callus around the wound which requires debridement however this does not appear to be a probing area. Culture I did last week show coag-negative staph. I thought this was probably not something that was worth treating but In the interim he was seen by his primary care with parotitis. He is now on clindamycin and Cipro and the Staphylococcus we cultured is Cipro sensitive [lugdunensis} 04/27/17; plantar left first toe. Again a lot of circumferential callus around a small open area. Removing the callus also nonviable subcutaneous tissue. The wound looks better today. He has completed his antibiotics for the parotitis. I didn't think additional antibiotics were necessary in terms of the wound 05/11/17; plantar first toe. I thought this might be closed this week however once again he has a deep probing small hole. Using pickups and a scalpel debridement of thick callus and subcutaneous tissue from around the wound reveals a depth of about 0.4 cm. We have not been able to offload the area. There is no obvious  infection here. No evidence of ischemia 05/18/17; since his last  visit he's been in hospital for oral surgery apparently to remove a retained surgical screw. He has swelling of his jaw and has some discomfort. He arrives today with the plantar left first toe in the same predicament. No major change. Each time he comes in here he has thick adherent callus 05/25/17; arrives today with roughly the same condition of the plantar left great toe. Perhaps somewhat better less callus. There are no options for offloading. He is on his feet a lot and I think this is largely a pressure issue X-ray I did last week showed no evidence of osteomyelitis. He has a toe separator as the great toe tends to ride under the second toe when he is walking. I'm hopeful that this will allow for a little less pressure on the plantar aspect of the left great toe 06/07/2017 -- he is working hard on giving up smoking and other than that has been wearing his diabetic shoes and is also trying to offload as much as possible. 06/15/17; patient arrived in clinic today with a difficult area on his left medial great toe closed over. There is still amount of callus on this however I don't believe there is any underlying open wound. He has custom shoes with inserts. I still think he is going to need to offload this area going forward. He also follows with Dr. Amalia Hailey of podiatry for routine foot care READMISSION 11/01/16; patient arrives in clinic today with a five-day history of a new area on the previously problematic left great toe. He is wearing his diabetic shoes and he apparently has a new pair coming. The patient is a type II diabetic with neuropathy. He follows with Dr. Amalia Hailey of podiatry for pre-ulcerative callus on his bilateral feet last seen on 08/24/17. He has developed a large blister on top of his left great toe this is since gone down somewhat. He is not been dressing this specifically. He does not have a history of significant PAD  previous ABIs in this clinic were1.1 11/06/17; left great toe has epithelialized since last time. I think this was caused by a insoles an issue pushing the toe against the dorsal aspect of the shoe itself has new diabetic shoes coming hopefully will have more forefoot depth. READMISSION 03/01/2022 This is a now 71 year old man with a past medical history significant for type 2 diabetes mellitus with end-stage renal disease on hemodialysis as as well as congestive heart failure, ischemic cardiomyopathy, hypertension, and prior diabetic foot ulcers. He has had multiple issues with his hemodialysis access. He had a prior left radiocephalic fistula that failed. In October 2020, he had a left brachiobasilic fistula. He apparently underwent a procedure at an outside vascular office on November 09, 2021. Subsequent to that, he developed a wound on his left posterior forearm just distal to the elbow as well as wounds on 2 of his left digits. He was diagnosed with steal syndrome and recently underwent ligation of his fistula with placement of a hemodialysis catheter. He is here today for assistance in healing the wounds that developed related to steal syndrome. He says that he has been applying Neosporin to the sites and they are fairly sensitive to the touch. 03/08/2022: The wounds on his fingers are much smaller and have just a little bit of accumulated thin eschar. The wound on his left posterior forearm is also smaller but has accumulated both slough and eschar. He also has a thick callus on his right fifth metatarsal head plantar surface and  unfortunately, it appears that there is an open wound underneath the callus. We have been using Iodosorb with Hydrofera Blue to all of his open wounds. Pain is much improved from last week. 03/15/2022: The wounds on his fingers have healed. The wound on his left posterior forearm is smaller and has a nice layer of granulation tissue with just a bit of accumulated slough.  He has built up additional callus around the right fifth metatarsal head plantar surface, but the underlying wound has a good surface with granulation tissue present. We have been using Iodosorb with Hydrofera Blue. He did not endorse any pain this week. Electronic Signature(s) Signed: 03/15/2022 1:40:36 PM By: Fredirick Maudlin MD FACS Entered By: Fredirick Maudlin on 03/15/2022 13:40:36 -------------------------------------------------------------------------------- Physical Exam Details Patient Name: Date of Service: Jacob [name]. 03/15/2022 12:30 PM Medical Record Number: [id number] Patient Account Number: [id number] Date of Birth/Sex: Treating RN: June 16, 1951 (71 y.o. Jacob [name] Primary Care Provider: Alain Honey Other Clinician: Referring Provider: Treating Provider/Extender: Neil Crouch in Treatment: 2 Constitutional He is hypertensive, but asymptomatic.. . . . No acute distress. Respiratory Normal work of breathing on room air. Notes 03/15/2022: The wounds on his fingers have healed. The wound on his left posterior forearm is smaller and has a nice layer of granulation tissue with just a bit of accumulated slough. He has built up additional callus around the right fifth metatarsal head plantar surface, but the underlying wound has a good surface with granulation tissue present. Electronic Signature(s) Signed: 03/15/2022 1:41:28 PM By: Fredirick Maudlin MD FACS Entered By: Fredirick Maudlin on 03/15/2022 13:41:28 -------------------------------------------------------------------------------- Physician Orders Details Patient Name: Date of Service: Jacob [name]. 03/15/2022 12:30 PM Medical Record Number: [id number] Patient Account Number: [id number] Date of Birth/Sex: Treating RN: [birth year] (71 y.o. Jacob [name] Primary Care Provider: Alain Honey Other Clinician: Referring Provider: Treating Provider/Extender: Neil Crouch in Treatment: 2 Verbal / Phone Orders: No Diagnosis Coding ICD-10 Coding Code Description L98.499 Non-pressure chronic ulcer of skin of other sites with unspecified severity E11.22 Type 2 diabetes mellitus with diabetic chronic kidney disease J44.9 Chronic obstructive pulmonary disease, unspecified I50.42 Chronic combined systolic (congestive) and diastolic (congestive) heart failure I10 Essential (primary) hypertension N25.81 Secondary hyperparathyroidism of renal origin N18.6 End stage renal disease B20 Human immunodeficiency virus [HIV] disease L97.512 Non-pressure chronic ulcer of other part of right foot with fat layer exposed Follow-up Appointments ppointment in 1 week. - Dr Celine Ahr Room 3 Return A Bathing/ Shower/ Hygiene Other Bathing/Shower/Hygiene Orders/Instructions: - Change dressing after bathing Wound Treatment Wound #7 - Upper Arm Wound Laterality: Left, Posterior Cleanser: Soap and Water Every Other Day/30 Days Discharge Instructions: May shower and wash wound with dial antibacterial soap and water prior to dressing change. Cleanser: Wound Cleanser (Generic) Every Other Day/30 Days Discharge Instructions: Cleanse the wound with wound cleanser prior to applying a clean dressing using gauze sponges, not tissue or cotton balls. Prim Dressing: Hydrofera Blue Ready Foam, 2.5 x2.5 in (Generic) Every Other Day/30 Days ary Discharge Instructions: Apply to wound bed as instructed Secondary Dressing: Bordered Gauze, 4x4 in (Generic) Every Other Day/30 Days Discharge Instructions: Apply over primary dressing as directed. Wound #8 - Foot Wound Laterality: Plantar, Right Cleanser: Soap and Water Every Other Day/30 Days Discharge Instructions: May shower and wash wound with dial antibacterial soap and water prior to dressing change. Cleanser: Wound Cleanser (Generic) Every Other Day/30 Days Discharge Instructions: Cleanse the wound with wound  cleanser prior to applying a clean dressing using gauze sponges, not tissue or cotton balls. Prim Dressing: Hydrofera Blue Ready Foam, 2.5 x2.5 in (Generic) Every Other Day/30 Days ary Discharge Instructions: Apply to wound bed as instructed Secondary Dressing: Optifoam Non-Adhesive Dressing, 4x4 in (Generic) Every Other Day/30 Days Discharge Instructions: Apply over primary dressing as directed. Secondary Dressing: Woven Gauze Sponges 2x2 in (Generic) Every Other Day/30 Days Discharge Instructions: Apply over primary dressing as directed. Secured With: Child psychotherapist, Sterile 2x75 (in/in) (Generic) Every Other Day/30 Days Discharge Instructions: Secure with stretch gauze as directed. Secured With: 78M Medipore Public affairs consultant Surgical T 2x10 (in/yd) (Generic) Every Other Day/30 Days ape Discharge Instructions: Secure with tape as directed. Electronic Signature(s) Signed: 03/15/2022 1:41:41 PM By: Fredirick Maudlin MD FACS Entered By: Fredirick Maudlin on 03/15/2022 13:41:41 -------------------------------------------------------------------------------- Problem List Details Patient Name: Date of Service: Jacob [name]. 03/15/2022 12:30 PM Medical Record Number: [id number] Patient Account Number: [id number] Date of Birth/Sex: Treating RN: [birth year] (71 y.o. Jacob [name] Primary Care Provider: Alain Honey Other Clinician: Referring Provider: Treating Provider/Extender: Neil Crouch in Treatment: 2 Active Problems ICD-10 Encounter Code Description Active Date MDM Diagnosis L98.499 Non-pressure chronic ulcer of skin of other sites with unspecified severity 03/01/2022 No Yes E11.22 Type 2 diabetes mellitus with diabetic chronic kidney disease 03/01/2022 No Yes J44.9 Chronic obstructive pulmonary disease, unspecified 03/01/2022 No Yes I50.42 Chronic combined systolic (congestive) and diastolic (congestive) heart failure 03/01/2022 No Yes I10  Essential (primary) hypertension 03/01/2022 No Yes N25.81 Secondary hyperparathyroidism of renal origin 03/01/2022 No Yes N18.6 End stage renal disease 03/01/2022 No Yes B20 Human immunodeficiency virus [HIV] disease 03/01/2022 No Yes L97.512 Non-pressure chronic ulcer of other part of right foot with fat layer exposed 03/08/2022 No Yes Inactive Problems Resolved Problems Electronic Signature(s) Signed: 03/15/2022 1:38:41 PM By: Fredirick Maudlin MD FACS Entered By: Fredirick Maudlin on 03/15/2022 13:38:41 -------------------------------------------------------------------------------- Progress Note Details Patient Name: Date of Service: Jacob [name]. 03/15/2022 12:30 PM Medical Record Number: [id number] Patient Account Number: [id number] Date of Birth/Sex: Treating RN: April 04, 1951 (71 y.o. Jacob [name] Primary Care Provider: Alain Honey Other Clinician: Referring Provider: Treating Provider/Extender: Neil Crouch in Treatment: 2 Subjective Chief Complaint Information obtained from Patient Patient seen for complaints of Non-Healing Wounds x 3 History of Present Illness (HPI) The following HPI elements were documented for the patient's wound: Location: 2 ulcers noted to the left hallux Quality: the patient denies pain Duration: these were originally blisters towards the end of February and opened in early March Context: these wounds are secondary to pressure; he got new shoes in February and also started wearing a toe cover/sleeve Modifying Factors: pressure and friction are exacerbating factor 01/12/17- Mr. Rondeau arrives for initial evaluation of 2 ulcers to his left hallux. He states that back in February he did receive a new pair of diabetic shoes and at that time he started wearing toe cover/sleeves and noticed development of a blister to the dorsal and plantar aspect. He has been seen at [facility] by both Dr. Amalia Hailey and Dr. Jacqualyn Posey. He last  saw Dr.Wagoner on 3/8 at which time he was prescribed Silvadene and Keflex. He completed that dose of Keflex and states that he got a phone call to extend the Keflex prior to this appointment. He has a follow-up with Dr. Amalia Hailey on 3/26. He has been wearing a surgical shoe, no significant offloading to the plantar/medial ulcer but  the strap does not extend over the dorsal ulcer. T opically he has been applying the Silvadene daily and a dry dressing. He is a diabetic, on insulin, with a recent A1c of 6.2% (11/10/16). He is a current smoker, 0.5 PPD. He has no known diagnosis of arterial insufficiency. 01/20/17- The patient arrives for follow up evaluation of his ulcers to the left hallux. He admits to continuing to smoke approximately 0.5ppd. He states that his diet has been somewhat uncontrolled with higher than normal glucose levels, this seems circumstantial as he had a death in the family as food and schedule has been disrupted. he admits to wearing the surgical show with offloading modifications while out of the home, but has been ambulating barefoot around the home. he saw Dr. Amalia Hailey yesterday for toenail trimming and callus shaving to the right plantar foot and investigation of his ulcers. 01/26/17- he is here for follow-up evaluation of his left hallux ulcers. He states he has been significantly more compliant in the use of his offloading surgical shoe, with the exception of "getting up for a glass of water" he states that he has worn it with any ambulation. He states he has decreased his smoking too, admitting to only a cigarette since yesterday. He states his blood sugar this morning was 147 and yesterday was 116. He is voicing no complaints or concerns 02/02/17- He is here for follow-up evaluation of his left hallux ulcers. The dorsal ulcer is crusted over/healed. He states he has minimized his ambulation over this past week, much less than the previous week. He states his glucose levels have been  between 120-150 about half of the time, otherwise greater than 150. He cannot correlate any specific dietary changes. He does have an appointment with a dietitian next month. He was advised to keep a log of his glucose levels and his diet for that appointment. 02/06/17; patient comes in today for first application of the total contact cast. Her intake nurse reported the wounds look better therefore I did not actually see these before application of the cast. He'll return on Thursday for reapplication in the standard fashion 02/10/27- he is here for follow-up evaluation of his left plantar hallux ulcer. He came in Monday for application of a total contact cast. He states that he was tolerating that Monday, but noticed on Tuesday that his foot began to slide in the cast. He also is complaining of the heaviness of the cast, as he ambulates as his primary form of transportation. He continues to smoke,. He has not contacted his cardiologist regarding Nicorette gum. He states his blood sugars have been slightly elevated, this morning being 206. He states that he will complete an antibiotic tonight. He states this was prescribed by Dr. Jacqualyn Posey. He states that he has refilled this prescription twice, without direction to continue taking this medication. 02/16/17; patient wound about the same as last week per her description. Rolled edges of callus scan and nonviable subcutaneous tissue. Using Prisma. Apparently used topical antibiotic instead of the Prisma over the course of this week. He has not a total contact cast candidate secondary to ambulation needs and unsteadiness on his feet per discussion with our intake nurse 03/02/17 patient requires debridement. He has a small wound on the right great toe which probes however does not approach bone. What I can see of the base of this wound looks reasonably healthy and I think this is largely in offloading issue with the second toe crossing over the first. He  did not  tolerate a total contact cast has balance issues 5/171/8. 0.2x0.2x0.2 less depth. Using prisma 03/16/17; no major change. Still a small probing wound with copious amounts of surrounding callus skin nonviable subcutaneous tissue all of which debrided. Still using Prisma. I suspect the dimensions post debridement are about the same as listed above 0.2 x 0.2 x 0.2 03/23/17; no major change using Prisma. Once again a large amount of callus nonviable subcutaneous tissue around the wound. I went over the offloading issues with the patient and the nurse. He did not tolerate a total contact cast he uses public transportation among other issues. 03/30/17; no major change using Prisma. Once again a large amount of callus and nonviable tissue around the wound requires debridement. Saw Dr. Amalia Hailey who pared calluses on the other foot. 04/06/17; the wound is much smaller there is callus around this although I think I can see the base of the wound and I elected not to do any debridement. 04/13/17 this is a wound I did not debridement last week left callus intact. He arrives today with the area much in the same predicament however this time I remove the callus to find a deep probing hole. This is disappointing this is not go to bone. We have been using Prisma. 04/20/17 plantar left first toe. Again although a lot of circumferential callus around the wound which requires debridement however this does not appear to be a probing area. Culture I did last week show coag-negative staph. I thought this was probably not something that was worth treating but In the interim he was seen by his primary care with parotitis. He is now on clindamycin and Cipro and the Staphylococcus we cultured is Cipro sensitive [lugdunensis} 04/27/17; plantar left first toe. Again a lot of circumferential callus around a small open area. Removing the callus also nonviable subcutaneous tissue. The wound looks better today. He has completed his antibiotics  for the parotitis. I didn't think additional antibiotics were necessary in terms of the wound 05/11/17; plantar first toe. I thought this might be closed this week however once again he has a deep probing small hole. Using pickups and a scalpel debridement of thick callus and subcutaneous tissue from around the wound reveals a depth of about 0.4 cm. We have not been able to offload the area. There is no obvious infection here. No evidence of ischemia 05/18/17; since his last visit he's been in hospital for oral surgery apparently to remove a retained surgical screw. He has swelling of his jaw and has some discomfort. He arrives today with the plantar left first toe in the same predicament. No major change. Each time he comes in here he has thick adherent callus 05/25/17; arrives today with roughly the same condition of the plantar left great toe. Perhaps somewhat better less callus. There are no options for offloading. He is on his feet a lot and I think this is largely a pressure issue X-ray I did last week showed no evidence of osteomyelitis. He has a toe separator as the great toe tends to ride under the second toe when he is walking. I'm hopeful that this will allow for a little less pressure on the plantar aspect of the left great toe 06/07/2017 -- he is working hard on giving up smoking and other than that has been wearing his diabetic shoes and is also trying to offload as much as possible. 06/15/17; patient arrived in clinic today with a difficult area on his left medial  great toe closed over. There is still amount of callus on this however I don't believe there is any underlying open wound. He has custom shoes with inserts. I still think he is going to need to offload this area going forward. He also follows with Dr. Amalia Hailey of podiatry for routine foot care READMISSION 11/01/16; patient arrives in clinic today with a five-day history of a new area on the previously problematic left great toe. He is  wearing his diabetic shoes and he apparently has a new pair coming. The patient is a type II diabetic with neuropathy. He follows with Dr. Amalia Hailey of podiatry for pre-ulcerative callus on his bilateral feet last seen on 08/24/17. He has developed a large blister on top of his left great toe this is since gone down somewhat. He is not been dressing this specifically. He does not have a history of significant PAD previous ABIs in this clinic were1.1 11/06/17; left great toe has epithelialized since last time. I think this was caused by a insoles an issue pushing the toe against the dorsal aspect of the shoe itself has new diabetic shoes coming hopefully will have more forefoot depth. READMISSION 03/01/2022 This is a now 71 year old man with a past medical history significant for type 2 diabetes mellitus with end-stage renal disease on hemodialysis as as well as congestive heart failure, ischemic cardiomyopathy, hypertension, and prior diabetic foot ulcers. He has had multiple issues with his hemodialysis access. He had a prior left radiocephalic fistula that failed. In October 2020, he had a left brachiobasilic fistula. He apparently underwent a procedure at an outside vascular office on November 09, 2021. Subsequent to that, he developed a wound on his left posterior forearm just distal to the elbow as well as wounds on 2 of his left digits. He was diagnosed with steal syndrome and recently underwent ligation of his fistula with placement of a hemodialysis catheter. He is here today for assistance in healing the wounds that developed related to steal syndrome. He says that he has been applying Neosporin to the sites and they are fairly sensitive to the touch. 03/08/2022: The wounds on his fingers are much smaller and have just a little bit of accumulated thin eschar. The wound on his left posterior forearm is also smaller but has accumulated both slough and eschar. He also has a thick callus on his right  fifth metatarsal head plantar surface and unfortunately, it appears that there is an open wound underneath the callus. We have been using Iodosorb with Hydrofera Blue to all of his open wounds. Pain is much improved from last week. 03/15/2022: The wounds on his fingers have healed. The wound on his left posterior forearm is smaller and has a nice layer of granulation tissue with just a bit of accumulated slough. He has built up additional callus around the right fifth metatarsal head plantar surface, but the underlying wound has a good surface with granulation tissue present. We have been using Iodosorb with Hydrofera Blue. He did not endorse any pain this week. Patient History Information obtained from Patient. Family History Diabetes - Siblings,Father, Heart Disease - Father,Siblings, Hypertension - Father,Siblings, No family history of Cancer, Hereditary Spherocytosis, Kidney Disease, Lung Disease, Seizures, Stroke, Thyroid Problems, Tuberculosis. Social History Current every day smoker - 1/2 ppd / 81yr, Marital Status - Single, Alcohol Use - Moderate - 6 standard drinks per week, Drug Use - Prior History - Polysubstance Abuse; clean for 9 years, Caffeine Use - Rarely - tea. Medical History  Eyes Patient has history of Cataracts - mild, Glaucoma Denies history of Optic Neuritis Ear/Nose/Mouth/Throat Denies history of Chronic sinus problems/congestion, Middle ear problems Hematologic/Lymphatic Patient has history of Anemia Denies history of Hemophilia, Human Immunodeficiency Virus, Lymphedema, Sickle Cell Disease Respiratory Patient has history of Chronic Obstructive Pulmonary Disease (COPD) Denies history of Aspiration, Asthma, Pneumothorax, Sleep Apnea, Tuberculosis Cardiovascular Patient has history of Congestive Heart Failure, Coronary Artery Disease, Hypertension, Myocardial Infarction - 2004 Denies history of Angina, Arrhythmia, Deep Vein Thrombosis, Hypotension, Peripheral  Arterial Disease, Peripheral Venous Disease, Phlebitis, Vasculitis Gastrointestinal Patient has history of Hepatitis C - 1968 Denies history of Cirrhosis , Colitis, Crohnoos, Hepatitis A, Hepatitis B Endocrine Patient has history of Type II Diabetes Denies history of Type I Diabetes Genitourinary Denies history of End Stage Renal Disease Immunological Denies history of Lupus Erythematosus, Raynaudoos, Scleroderma Integumentary (Skin) Denies history of History of Burn Musculoskeletal Patient has history of Gout, Osteoarthritis Denies history of Rheumatoid Arthritis, Osteomyelitis Neurologic Patient has history of Neuropathy Denies history of Dementia, Quadriplegia, Paraplegia, Seizure Disorder Oncologic Denies history of Received Chemotherapy, Received Radiation Psychiatric Denies history of Anorexia/bulimia, Confinement Anxiety Hospitalization/Surgery History - Colonoscopy. - CABG. - Laproscopic Cholecystectomy. - Left Hip Fx. - Left Jaw Fx. - Lumbar Laminectomy. Medical A Surgical History Notes nd Constitutional Symptoms (General Health) Insomnia Syncope Alcohol Dependence HIV Hematologic/Lymphatic Hyperlipidemia Hyperglycemia Cardiovascular CABG, ischemic cardiomyopathy Gastrointestinal GERD Genitourinary Syphilis CKD,Nocturia Immunological HIV Musculoskeletal Hip Fx BiLateral Bunions Unspecified Arthritis Neurologic chronic back pain Objective Constitutional He is hypertensive, but asymptomatic.[name] No acute distress. Vitals Time Taken: 12:47 PM, Height: 74 in, Weight: 172 lbs, BMI: 22.1, Temperature: 98.0 F, Pulse: 77 bpm, Respiratory Rate: 16 breaths/min, Blood Pressure: 174/84 mmHg. Respiratory Normal work of breathing on room air. General Notes: 03/15/2022: The wounds on his fingers have healed. The wound on his left posterior forearm is smaller and has a nice layer of granulation tissue with just a bit of accumulated slough. He has built up additional callus  around the right fifth metatarsal head plantar surface, but the underlying wound has a good surface with granulation tissue present. Integumentary (Hair, Skin) Wound #5 status is Healed - Epithelialized. Original cause of wound was Blister. The date acquired was: 11/09/2021. The wound has been in treatment 2 weeks. The wound is located on the Left Hand - 2nd Digit. The wound measures 0cm length x 0cm width x 0cm depth; 0cm^2 area and 0cm^3 volume. There is Fat Layer (Subcutaneous Tissue) exposed. There is no tunneling or undermining noted. There is a none present amount of drainage noted. The wound margin is flat and intact. There is no granulation within the wound bed. There is no necrotic tissue within the wound bed. Wound #6 status is Healed - Epithelialized. Original cause of wound was Blister. The date acquired was: 11/09/2021. The wound has been in treatment 2 weeks. The wound is located on the Left Hand - 3rd Digit. The wound measures 0cm length x 0cm width x 0cm depth; 0cm^2 area and 0cm^3 volume. There is no tunneling or undermining noted. There is a none present amount of drainage noted. The wound margin is flat and intact. There is no granulation within the wound bed. There is no necrotic tissue within the wound bed. Wound #7 status is Open. Original cause of wound was Blister. The date acquired was: 11/09/2021. The wound has been in treatment 2 weeks. The wound is located on the Left,Posterior Upper Arm. The wound measures 1cm length x 0.8cm width x 0.1cm depth;  0.628cm^2 area and 0.063cm^3 volume. There is Fat Layer (Subcutaneous Tissue) exposed. There is no tunneling or undermining noted. There is a medium amount of serosanguineous drainage noted. The wound margin is flat and intact. There is small (1-33%) pink granulation within the wound bed. There is a large (67-100%) amount of necrotic tissue within the wound bed including Eschar and Adherent Slough. Wound #8 status is Open. Original  cause of wound was Blister. The date acquired was: 03/08/2022. The wound has been in treatment 1 weeks. The wound is located on the [place]. The wound measures 0.5cm length x 0.5cm width x 0.3cm depth; 0.196cm^2 area and 0.059cm^3 volume. There is undermining starting at 7:00 and ending at 10:00 with a maximum distance of 0.3cm. There is a medium amount of serosanguineous drainage noted. The wound margin is thickened. There is small (1-33%) red granulation within the wound bed. There is a large (67-100%) amount of necrotic tissue within the wound bed including Eschar and Adherent Slough. Assessment Active Problems ICD-10 Non-pressure chronic ulcer of skin of other sites with unspecified severity Type 2 diabetes mellitus with diabetic chronic kidney disease Chronic obstructive pulmonary disease, unspecified Chronic combined systolic (congestive) and diastolic (congestive) heart failure Essential (primary) hypertension Secondary hyperparathyroidism of renal origin End stage renal disease Human immunodeficiency virus [HIV] disease Non-pressure chronic ulcer of other part of right foot with fat layer exposed Procedures Wound #7 Pre-procedure diagnosis of Wound #7 is a T be determined located on the Left,Posterior Upper Arm . There was a Selective/Open Wound Non-Viable Tissue o Debridement with a total area of 0.8 sq cm performed by Fredirick Maudlin, MD. With the following instrument(s): Curette to remove Non-Viable tissue/material. Material removed includes Eschar and Slough and after achieving pain control using Other (Benzocaine 20%). No specimens were taken. A time out was conducted at 13:17, prior to the start of the procedure. A Minimum amount of bleeding was controlled with Pressure. The procedure was tolerated well with a pain level of 0 throughout and a pain level of 0 following the procedure. Post Debridement Measurements: 1cm length x 0.8cm width x 0.1cm depth; 0.063cm^3  volume. Character of Wound/Ulcer Post Debridement is improved. Post procedure Diagnosis Wound #7: Same as Pre-Procedure Wound #8 Pre-procedure diagnosis of Wound #8 is a Diabetic Wound/Ulcer of the Lower Extremity located on the Right,Plantar Foot .Severity of Tissue Pre Debridement is: Fat layer exposed. There was a Excisional Skin/Subcutaneous Tissue Debridement with a total area of 0.25 sq cm performed by Fredirick Maudlin, MD. With the following instrument(s): Curette to remove Non-Viable tissue/material. Material removed includes Callus, Subcutaneous Tissue, and Slough after achieving pain control using Other (Benzocaine 20%). No specimens were taken. A time out was conducted at 13:17, prior to the start of the procedure. A Minimum amount of bleeding was controlled with Pressure. The procedure was tolerated well with a pain level of 0 throughout and a pain level of 0 following the procedure. Post Debridement Measurements: 0.5cm length x 0.5cm width x 0.3cm depth; 0.059cm^3 volume. Character of Wound/Ulcer Post Debridement is improved. Severity of Tissue Post Debridement is: Fat layer exposed. Post procedure Diagnosis Wound #8: Same as Pre-Procedure Plan Follow-up Appointments: Return Appointment in 1 week. - Dr Celine Ahr Room 3 Bathing/ Shower/ Hygiene: Other Bathing/Shower/Hygiene Orders/Instructions: - Change dressing after bathing WOUND #7: - Upper Arm Wound Laterality: Left, Posterior Cleanser: Soap and Water Every Other Day/30 Days Discharge Instructions: May shower and wash wound with dial antibacterial soap and water prior to dressing change. Cleanser: Wound  Cleanser (Generic) Every Other Day/30 Days Discharge Instructions: Cleanse the wound with wound cleanser prior to applying a clean dressing using gauze sponges, not tissue or cotton balls. Prim Dressing: Hydrofera Blue Ready Foam, 2.5 x2.5 in (Generic) Every Other Day/30 Days ary Discharge Instructions: Apply to wound bed as  instructed Secondary Dressing: Bordered Gauze, 4x4 in (Generic) Every Other Day/30 Days Discharge Instructions: Apply over primary dressing as directed. WOUND #8: - Foot Wound Laterality: Plantar, Right Cleanser: Soap and Water Every Other Day/30 Days Discharge Instructions: May shower and wash wound with dial antibacterial soap and water prior to dressing change. Cleanser: Wound Cleanser (Generic) Every Other Day/30 Days Discharge Instructions: Cleanse the wound with wound cleanser prior to applying a clean dressing using gauze sponges, not tissue or cotton balls. Prim Dressing: Hydrofera Blue Ready Foam, 2.5 x2.5 in (Generic) Every Other Day/30 Days ary Discharge Instructions: Apply to wound bed as instructed Secondary Dressing: Optifoam Non-Adhesive Dressing, 4x4 in (Generic) Every Other Day/30 Days Discharge Instructions: Apply over primary dressing as directed. Secondary Dressing: Woven Gauze Sponges 2x2 in (Generic) Every Other Day/30 Days Discharge Instructions: Apply over primary dressing as directed. Secured With: Child psychotherapist, Sterile 2x75 (in/in) (Generic) Every Other Day/30 Days Discharge Instructions: Secure with stretch gauze as directed. Secured With: 33M Medipore Public affairs consultant Surgical T 2x10 (in/yd) (Generic) Every Other Day/30 Days ape Discharge Instructions: Secure with tape as directed. 03/15/2022: The wounds on his fingers have healed. The wound on his left posterior forearm is smaller and has a nice layer of granulation tissue with just a bit of accumulated slough. He has built up additional callus around the right fifth metatarsal head plantar surface, but the underlying wound has a good surface with granulation tissue present. I used a curette to debride the slough and eschar from his upper arm wound as well as the callus from around his foot wound. Both sites are clean and I do not think we need Iodosorb anymore. We will continue using Hydrofera Blue.  Follow-up in 1 week. Electronic Signature(s) Signed: 03/15/2022 1:42:18 PM By: Fredirick Maudlin MD FACS Entered By: Fredirick Maudlin on 03/15/2022 13:42:18 -------------------------------------------------------------------------------- HxROS Details Patient Name: Date of Service: Jacob [name]. 03/15/2022 12:30 PM Medical Record Number: [id number] Patient Account Number: [id number] Date of Birth/Sex: Treating RN: [birth year] (71 y.o. Jacob [name] Primary Care Provider: Alain Honey Other Clinician: Referring Provider: Treating Provider/Extender: Neil Crouch in Treatment: 2 Information Obtained From Patient Constitutional Symptoms (General Health) Medical History: Past Medical History Notes: Insomnia Syncope Alcohol Dependence HIV Eyes Medical History: Positive for: Cataracts - mild; Glaucoma Negative for: Optic Neuritis Ear/Nose/Mouth/Throat Medical History: Negative for: Chronic sinus problems/congestion; Middle ear problems Hematologic/Lymphatic Medical History: Positive for: Anemia Negative for: Hemophilia; Human Immunodeficiency Virus; Lymphedema; Sickle Cell Disease Past Medical History Notes: Hyperlipidemia Hyperglycemia Respiratory Medical History: Positive for: Chronic Obstructive Pulmonary Disease (COPD) Negative for: Aspiration; Asthma; Pneumothorax; Sleep Apnea; Tuberculosis Cardiovascular Medical History: Positive for: Congestive Heart Failure; Coronary Artery Disease; Hypertension; Myocardial Infarction - 2004 Negative for: Angina; Arrhythmia; Deep Vein Thrombosis; Hypotension; Peripheral Arterial Disease; Peripheral Venous Disease; Phlebitis; Vasculitis Past Medical History Notes: CABG, ischemic cardiomyopathy Gastrointestinal Medical History: Positive for: Hepatitis C - 1968 Negative for: Cirrhosis ; Colitis; Crohns; Hepatitis A; Hepatitis B Past Medical History Notes: GERD Endocrine Medical History: Positive  for: Type II Diabetes Negative for: Type I Diabetes Time with diabetes: 13 yrs Treated with: Insulin Blood sugar tested every day: Yes Tested : 2 times  per day Blood sugar testing results: Breakfast: 100; Dinner: 95-126 Genitourinary Medical History: Negative for: End Stage Renal Disease Past Medical History Notes: Syphilis CKD,Nocturia Immunological Medical History: Negative for: Lupus Erythematosus; Raynauds; Scleroderma Past Medical History Notes: HIV Integumentary (Skin) Medical History: Negative for: History of Burn Musculoskeletal Medical History: Positive for: Gout; Osteoarthritis Negative for: Rheumatoid Arthritis; Osteomyelitis Past Medical History Notes: Hip Fx BiLateral Bunions Unspecified Arthritis Neurologic Medical History: Positive for: Neuropathy Negative for: Dementia; Quadriplegia; Paraplegia; Seizure Disorder Past Medical History Notes: chronic back pain Oncologic Medical History: Negative for: Received Chemotherapy; Received Radiation Psychiatric Medical History: Negative for: Anorexia/bulimia; Confinement Anxiety HBO Extended History Items Eyes: Eyes: Cataracts Glaucoma Immunizations Pneumococcal Vaccine: Received Pneumococcal Vaccination: Yes Received Pneumococcal Vaccination On or After 60th Birthday: Yes Implantable Devices No devices added Hospitalization / Surgery History Type of Hospitalization/Surgery Colonoscopy CABG Laproscopic Cholecystectomy Left Hip Fx Left Jaw Fx Lumbar Laminectomy Family and Social History Cancer: No; Diabetes: Yes - Siblings,Father; Heart Disease: Yes - Father,Siblings; Hereditary Spherocytosis: No; Hypertension: Yes - Father,Siblings; Kidney Disease: No; Lung Disease: No; Seizures: No; Stroke: No; Thyroid Problems: No; Tuberculosis: No; Current every day smoker - 1/2 ppd / 52yr; Marital Status - Single; Alcohol Use: Moderate - 6 standard drinks per week; Drug Use: Prior History - Polysubstance Abuse;  clean for 9 years; Caffeine Use: Rarely - tea; Financial Concerns: No; Food, Clothing or Shelter Needs: No; Support System Lacking: No; Transportation Concerns: No Electronic Signature(s) Signed: 03/15/2022 1:49:21 PM By: CFredirick MaudlinMD FACS Signed: 03/15/2022 5:59:48 PM By: SDellie CatholicRN Entered By: CFredirick Maudlinon 03/15/2022 13:40:43 -------------------------------------------------------------------------------- SuperBill Details Patient Name: Date of Service: NRaechel Ache5/23/2023 Medical Record Number: [id number] Account Number: [id number] of Birth/Sex: Treating RN: [id number]. 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 N18.6 End stage renal disease B20 Human immunodeficiency virus [HIV] disease L97.512 Non-pressure chronic ulcer of other part of right foot with fat layer exposed Facility Procedures CPT4 Code: [id number]: 11042 - DEB SUBQ TISSUE 20 SQ CM/< ICD-10 Diagnosis Description L97.512 Non-pressure chronic ulcer of other part of right foot with fat layer exposed Modifier: Quantity: 1 CPT4 Code: [id number]: 993716- DEBRIDE WOUND 1ST 20 SQ CM OR < ICD-10 Diagnosis Description L98.499 Non-pressure chronic ulcer of skin of other sites with unspecified severity Modifier: Quantity: 1 Physician Procedures : CPT4 Code Description Modifier [id number] 10175-  WC PHYS LEVEL 3 - EST PT 25 ICD-10 Diagnosis Description L97.512 Non-pressure chronic ulcer of other part of right foot with fat layer exposed L98.499 Non-pressure chronic ulcer of skin of other sites with  unspecified severity E11.22 Type 2 diabetes mellitus with diabetic chronic kidney disease N18.6 End stage renal disease Quantity: 1 : [id number] - WC PHYS SUBQ TISS 20 SQ CM ICD-10 Diagnosis Description L97.512 Non-pressure chronic ulcer of other part of right foot with fat layer exposed Quantity: 1 : [id number] - WC PHYS DEBR WO ANESTH 20 SQ CM ICD-10 Diagnosis Description L98.499 Non-pressure chronic ulcer of skin of other sites with unspecified severity Quantity: 1 Electronic Signature(s) Signed: 03/15/2022  1:42:49 PM By: Fredirick Maudlin MD FACS Entered By: Fredirick Maudlin on 03/15/2022 13:42:49

## 2022-03-15 NOTE — Progress Notes (Signed)
Jacob [name], HULBERT ([id number]) Visit Report for 03/15/2022 Arrival Information Details Patient Name: Date of Service: Jacob [name], Jacob [name] 03/15/2022 12:30 PM Medical Record Number: [id number] Patient Account Number: [id number] Date of Birth/Sex: Treating RN: 1951-01-04 (70 y.o. Jacob [name] Primary Care Cagney Steenson: Alain Honey Other Clinician: Referring Mariama Saintvil: Treating Adron Geisel/Extender: Neil Crouch in Treatment: 2 Visit Information History Since Last Visit Added or deleted any medications: No Patient Arrived: Jacob [name] Any new allergies or adverse reactions: No Arrival Time: 12:46 Had a fall or experienced change in No Accompanied By: alone activities of daily living that may affect Transfer Assistance: None risk of falls: Patient Requires Transmission-Based Precautions: No Signs or symptoms of abuse/neglect since last visito No Patient Has Alerts: No Hospitalized since last visit: No Implantable device outside of the clinic excluding No cellular tissue based products placed in the center since last visit: Has Dressing in Place as Prescribed: Yes Pain Present Now: Yes Electronic Signature(s) Signed: 03/15/2022 5:59:48 PM By: Dellie Catholic RN Entered By: Dellie Catholic on 03/15/2022 12:47:10 -------------------------------------------------------------------------------- Encounter Discharge Information Details Patient Name: Date of Service: Jacob [name]. 03/15/2022 12:30 PM Medical Record Number: [id number] Patient Account Number: [id number] Date of Birth/Sex: Treating RN: [birth year] (71 y.o. Jacob [name] Primary Care Ryna Beckstrom: Alain Honey Other Clinician: Referring Stephan Nelis: Treating [place]/Extender: Neil Crouch in Treatment: 2 Encounter Discharge Information Items Post Procedure Vitals Discharge Condition: Stable Temperature (F): 98 Ambulatory Status: Cane Pulse (bpm): 77 Discharge  Destination: Home Respiratory Rate (breaths/min): 18 Transportation: Private Auto Blood Pressure (mmHg): 174/84 Accompanied By: self Schedule Follow-up Appointment: Yes Clinical Summary of Care: Patient Declined Electronic Signature(s) Signed: 03/15/2022 5:59:48 PM By: Dellie Catholic RN Entered By: Dellie Catholic on 03/15/2022 17:59:24 -------------------------------------------------------------------------------- Lower Extremity Assessment Details Patient Name: Date of Service: Jacob [name] 03/15/2022 12:30 PM Medical Record Number: [id number] Patient Account Number: [id number] Date of Birth/Sex: Treating RN: 08-Jul-1951 (71 y.o. Jacob [name] Primary Care Kean Gautreau: Alain Honey Other Clinician: Referring Nissim Fleischer: Treating Gail Creekmore/Extender: Neil Crouch in Treatment: 2 Edema Assessment Assessed: Jacob [name]: No] [Right: No] E[Left: dema] [Right: :] Calf Left: Right: Point of Measurement: From Medial Instep 32.5 cm Ankle Left: Right: Point of Measurement: From Medial Instep 20.8 cm Electronic Signature(s) Signed: 03/15/2022 5:59:48 PM By: Dellie Catholic RN Entered By: Dellie Catholic on 03/15/2022 12:50:45 -------------------------------------------------------------------------------- Multi Wound Chart Details Patient Name: Date of Service: Jacob [name]. 03/15/2022 12:30 PM Medical Record Number: [id number] Patient Account Number: [id number] Date of Birth/Sex: Treating RN: 12/11/1950 (71 y.o. Jacob [name] Primary Care Avyukt Cimo: Alain Honey Other Clinician: Referring Chanette Demo: Treating Semisi Biela/Extender: Neil Crouch in Treatment: 2 Vital Signs Height(in): 29 Pulse(bpm): 68 Weight(lbs): 172 Blood Pressure(mmHg): 174/84 Body Mass Index(BMI): 22.1 Temperature(F): 98.0 Respiratory Rate(breaths/min): 16 Photos: Left Hand - 2nd Digit Left Hand - 3rd Digit Left, Posterior Upper  Arm Wound Location: Blister Blister Blister Wounding Event: T be determined o T be determined o T be determined o Primary Etiology: Cataracts, Glaucoma, Anemia, Chronic Cataracts, Glaucoma, Anemia, Chronic Cataracts, Glaucoma, Anemia, Chronic Comorbid History: Obstructive Pulmonary Disease Obstructive Pulmonary Disease Obstructive Pulmonary Disease (COPD), Congestive Heart Failure, (COPD), Congestive Heart Failure, (COPD), Congestive Heart Failure, Coronary Artery Disease, Coronary Artery Disease, Coronary Artery Disease, Hypertension, Myocardial Infarction, Hypertension, Myocardial Infarction, Hypertension, Myocardial Infarction, Hepatitis C, Type II Diabetes, Gout, Hepatitis C, Type II Diabetes, Gout, Hepatitis C, Type II Diabetes, Gout, Osteoarthritis, Neuropathy Osteoarthritis, Neuropathy Osteoarthritis,  Neuropathy 11/09/2021 11/09/2021 11/09/2021 Date Acquired: '2 2 2 '$ Weeks of Treatment: Healed - Epithelialized Healed - Epithelialized Open Wound Status: No No No Wound Recurrence: 0x0x0 0x0x0 1x0.8x0.1 Measurements L x W x D (cm) 0 0 0.628 A (cm) : rea 0 0 0.063 Volume (cm) : 100.00% 100.00% 81.80% % Reduction in A rea: 100.00% 100.00% 81.80% % Reduction in Volume: No No No Undermining: Full Thickness Without Exposed Full Thickness Without Exposed Full Thickness Without Exposed Classification: Support Structures Support Structures Support Structures None Present None Present Medium Exudate A mount: N/A N/A Serosanguineous Exudate Type: N/A N/A red, brown Exudate Color: Flat and Intact Flat and Intact Flat and Intact Wound Margin: None Present (0%) None Present (0%) Small (1-33%) Granulation A mount: N/A N/A Pink Granulation Quality: None Present (0%) None Present (0%) Large (67-100%) Necrotic A mount: N/A N/A Eschar, Adherent Slough Necrotic Tissue: Fat Layer (Subcutaneous Tissue): Yes Fascia: No Fat Layer (Subcutaneous Tissue): Yes Exposed  Structures: Fascia: No Fat Layer (Subcutaneous Tissue): No Fascia: No Tendon: No Tendon: No Tendon: No Muscle: No Muscle: No Muscle: No Joint: No Joint: No Joint: No Bone: No Bone: No Bone: No Large (67-100%) Large (67-100%) Medium (34-66%) Epithelialization: N/A N/A Debridement - Selective/Open Wound Debridement: Pre-procedure Verification/Time Out N/A N/A 13:17 Taken: N/A N/A Other Pain Control: N/A N/A Necrotic/Eschar, Slough Tissue Debrided: N/A N/A Non-Viable Tissue Level: N/A N/A 0.8 Debridement A (sq cm): rea N/A N/A Curette Instrument: N/A N/A Minimum Bleeding: N/A N/A Pressure Hemostasis A chieved: N/A N/A 0 Procedural Pain: N/A N/A 0 Post Procedural Pain: N/A N/A Procedure was tolerated well Debridement Treatment Response: N/A N/A 1x0.8x0.1 Post Debridement Measurements L x W x D (cm) N/A N/A 0.063 Post Debridement Volume: (cm) N/A N/A Debridement Procedures Performed: Wound Number: 8 N/A N/A Photos: N/A N/A Right, Plantar Foot N/A N/A Wound Location: Blister N/A N/A Wounding Event: Diabetic Wound/Ulcer of the Lower N/A N/A Primary Etiology: Extremity Cataracts, Glaucoma, Anemia, Chronic N/A N/A Comorbid History: Obstructive Pulmonary Disease (COPD), Congestive Heart Failure, Coronary Artery Disease, Hypertension, Myocardial Infarction, Hepatitis C, Type II Diabetes, Gout, Osteoarthritis, Neuropathy 03/08/2022 N/A N/A Date Acquired: 1 N/A N/A Weeks of Treatment: Open N/A N/A Wound Status: No N/A N/A Wound Recurrence: 0.5x0.5x0.3 N/A N/A Measurements L x W x D (cm) 0.196 N/A N/A A (cm) : rea 0.059 N/A N/A Volume (cm) : 82.20% N/A N/A % Reduction in A rea: 82.10% N/A N/A % Reduction in Volume: 7 Starting Position 1 (o'clock): 10 Ending Position 1 (o'clock): 0.3 Maximum Distance 1 (cm): Yes N/A N/A Undermining: Grade 2 N/A N/A Classification: Medium N/A N/A Exudate A mount: Serosanguineous N/A N/A Exudate  Type: red, brown N/A N/A Exudate Color: Thickened N/A N/A Wound Margin: Small (1-33%) N/A N/A Granulation Amount: Red N/A N/A Granulation Quality: Large (67-100%) N/A N/A Necrotic Amount: Eschar, Adherent Slough N/A N/A Necrotic Tissue: Fascia: No N/A N/A Exposed Structures: Fat Layer (Subcutaneous Tissue): No Tendon: No Muscle: No Joint: No Bone: No None N/A N/A Epithelialization: Debridement - Excisional N/A N/A Debridement: Pre-procedure Verification/Time Out 13:17 N/A N/A Taken: Other N/A N/A Pain Control: Callus, Subcutaneous, Slough N/A N/A Tissue Debrided: Skin/Subcutaneous Tissue N/A N/A Level: 0.25 N/A N/A Debridement A (sq cm): rea Curette N/A N/A Instrument: Minimum N/A N/A Bleeding: Pressure N/A N/A Hemostasis A chieved: 0 N/A N/A Procedural Pain: 0 N/A N/A Post Procedural Pain: Procedure was tolerated well N/A N/A Debridement Treatment Response: 0.5x0.5x0.3 N/A N/A Post Debridement Measurements L x W x D (cm) 0.059 N/A  N/A Post Debridement Volume: (cm) Debridement N/A N/A Procedures Performed: Treatment Notes Electronic Signature(s) Signed: 03/15/2022 1:39:32 PM By: Fredirick Maudlin MD FACS Signed: 03/15/2022 5:59:48 PM By: Dellie Catholic RN Entered By: Fredirick Maudlin on 03/15/2022 13:39:31 -------------------------------------------------------------------------------- Multi-Disciplinary Care Plan Details Patient Name: Date of Service: Jacob [name]. 03/15/2022 12:30 PM Medical Record Number: [id number] Patient Account Number: [id number] Date of Birth/Sex: Treating RN: 1951-01-21 (71 y.o. Jacob [name] Primary Care Sherrise Liberto: Alain Honey Other Clinician: Referring Kasiya Burck: Treating Brya Simerly/Extender: Neil Crouch in Treatment: 2 Active Inactive Wound/Skin Impairment Nursing Diagnoses: Impaired tissue integrity Goals: Patient/caregiver will verbalize understanding of skin care  regimen Date Initiated: 03/01/2022 Target Resolution Date: 04/22/2022 Goal Status: Active Interventions: Assess ulceration(s) every visit Treatment Activities: Skin care regimen initiated : 03/01/2022 Notes: Electronic Signature(s) Signed: 03/15/2022 5:59:48 PM By: Dellie Catholic RN Entered By: Dellie Catholic on 03/15/2022 17:57:46 -------------------------------------------------------------------------------- Pain Assessment Details Patient Name: Date of Service: Jacob [name]. 03/15/2022 12:30 PM Medical Record Number: [id number] Patient Account Number: [id number] Date of Birth/Sex: Treating RN: [birth year] (71 y.o. Jacob [name] Primary Care Arvon Schreiner: Alain Honey Other Clinician: Referring Callum Wolf: Treating Ramez Arrona/Extender: Neil Crouch in Treatment: 2 Active Problems Location of Pain Severity and Description of Pain Patient Has Paino Yes Site Locations Pain Location: Generalized Pain With Dressing Change: Yes Duration of the Pain. Constant / Intermittento Constant Rate the pain. Current Pain Level: 5 Worst Pain Level: 10 Least Pain Level: 5 Tolerable Pain Level: 5 Pain Management and Medication Current Pain Management: Medication: No Cold Application: No Rest: No Massage: No Activity: No T.E.N.S.: No Heat Application: No Leg drop or elevation: No Is the Current Pain Management Adequate: Inadequate How does your wound impact your activities of daily livingo Sleep: No Bathing: No Appetite: No Relationship With Others: No Bladder Continence: No Emotions: No Bowel Continence: No Work: No Toileting: No Drive: No Dressing: No Hobbies: No Electronic Signature(s) Signed: 03/15/2022 5:59:48 PM By: Dellie Catholic RN Entered By: Dellie Catholic on 03/15/2022 12:49:45 -------------------------------------------------------------------------------- Patient/Caregiver Education Details Patient Name: Date of  Service: Jacob [name] 5/23/2023andnbsp12:30 PM Medical Record Number: [id number] Patient Account Number: [id number] Date of Birth/Gender: Treating RN: [birth year] (71 y.o. Jacob [name] Primary Care Physician: Alain Honey Other Clinician: Referring Physician: Treating Physician/Extender: Neil Crouch in Treatment: 2 Education Assessment Education Provided To: Patient Education Topics Provided Wound/Skin Impairment: Methods: Explain/Verbal Responses: Return demonstration correctly Electronic Signature(s) Signed: 03/15/2022 5:59:48 PM By: Dellie Catholic RN Entered By: Dellie Catholic on 03/15/2022 17:57:58 -------------------------------------------------------------------------------- Wound Assessment Details Patient Name: Date of Service: Jacob [name]. 03/15/2022 12:30 PM Medical Record Number: [id number] Patient Account Number: [id number] Date of Birth/Sex: Treating RN: [birth year] (71 y.o. Jacob [name] Primary Care Corey Laski: Alain Honey Other Clinician: Referring Robin Pafford: Treating Addisen Chappelle/Extender: Neil Crouch in Treatment: 2 Wound Status Wound Number: 5 Primary T be determined o Etiology: Wound Location: Left Hand - 2nd Digit Wound Healed - Epithelialized Wounding Event: Blister Status: Date Acquired: 11/09/2021 Comorbid Cataracts, Glaucoma, Anemia, Chronic Obstructive Pulmonary Weeks Of Treatment: 2 History: Disease (COPD), Congestive Heart Failure, Coronary Artery Clustered Wound: No Disease, Hypertension, Myocardial Infarction, Hepatitis C, Type II Diabetes, Gout, Osteoarthritis, Neuropathy Photos Wound Measurements Length: (cm) Width: (cm) Depth: (cm) Area: (cm) Volume: (cm) 0 % Reduction in Area: 100% 0 % Reduction in Volume: 100% 0 Epithelialization: Large (67-100%) 0 Tunneling: No 0 Undermining: No Wound Description Classification: Full  Thickness Without Exposed  Support Structures Wound Margin: Flat and Intact Exudate Amount: None Present Foul Odor After Cleansing: No Slough/Fibrino No Wound Bed Granulation Amount: None Present (0%) Exposed Structure Necrotic Amount: None Present (0%) Fascia Exposed: No Fat Layer (Subcutaneous Tissue) Exposed: Yes Tendon Exposed: No Muscle Exposed: No Joint Exposed: No Bone Exposed: No Electronic Signature(s) Signed: 03/15/2022 5:59:48 PM By: Dellie Catholic RN Entered By: Dellie Catholic on 03/15/2022 13:16:31 -------------------------------------------------------------------------------- Wound Assessment Details Patient Name: Date of Service: Jacob [name]. 03/15/2022 12:30 PM Medical Record Number: [id number] Patient Account Number: [id number] Date of Birth/Sex: Treating RN: 1951/10/23 (71 y.o. Jacob [name] Primary Care Taggert Bozzi: Alain Honey Other Clinician: Referring Kimball Appleby: Treating Reno Clasby/Extender: Neil Crouch in Treatment: 2 Wound Status Wound Number: 6 Primary T be determined o Etiology: Wound Location: Left Hand - 3rd Digit Wound Healed - Epithelialized Wounding Event: Blister Status: Date Acquired: 11/09/2021 Comorbid Cataracts, Glaucoma, Anemia, Chronic Obstructive Pulmonary Weeks Of Treatment: 2 History: Disease (COPD), Congestive Heart Failure, Coronary Artery Clustered Wound: No Disease, Hypertension, Myocardial Infarction, Hepatitis C, Type II Diabetes, Gout, Osteoarthritis, Neuropathy Photos Wound Measurements Length: (cm) Width: (cm) Depth: (cm) Area: (cm) Volume: (cm) 0 % Reduction in Area: 100% 0 % Reduction in Volume: 100% 0 Epithelialization: Large (67-100%) 0 Tunneling: No 0 Undermining: No Wound Description Classification: Full Thickness Without Exposed Support Structures Wound Margin: Flat and Intact Exudate Amount: None Present Foul Odor After Cleansing: No Slough/Fibrino No Wound Bed Granulation Amount: None  Present (0%) Exposed Structure Necrotic Amount: None Present (0%) Fascia Exposed: No Fat Layer (Subcutaneous Tissue) Exposed: No Tendon Exposed: No Muscle Exposed: No Joint Exposed: No Bone Exposed: No Electronic Signature(s) Signed: 03/15/2022 5:59:48 PM By: Dellie Catholic RN Entered By: Dellie Catholic on 03/15/2022 13:20:34 -------------------------------------------------------------------------------- Wound Assessment Details Patient Name: Date of Service: Jacob [name]. 03/15/2022 12:30 PM Medical Record Number: [id number] Patient Account Number: [id number] Date of Birth/Sex: Treating RN: Dec 22, 1950 (71 y.o. Jacob [name] Primary Care Naylin Burkle: Alain Honey Other Clinician: Referring Secret Kristensen: Treating Remiel Corti/Extender: Neil Crouch in Treatment: 2 Wound Status Wound Number: 7 Primary T be determined o Etiology: Wound Location: Left, Posterior Upper Arm Wound Open Wounding Event: Blister Status: Date Acquired: 11/09/2021 Comorbid Cataracts, Glaucoma, Anemia, Chronic Obstructive Pulmonary Weeks Of Treatment: 2 History: Disease (COPD), Congestive Heart Failure, Coronary Artery Clustered Wound: No Disease, Hypertension, Myocardial Infarction, Hepatitis C, Type II Diabetes, Gout, Osteoarthritis, Neuropathy Photos Wound Measurements Length: (cm) 1 Width: (cm) 0.8 Depth: (cm) 0.1 Area: (cm) 0.628 Volume: (cm) 0.063 % Reduction in Area: 81.8% % Reduction in Volume: 81.8% Epithelialization: Medium (34-66%) Tunneling: No Undermining: No Wound Description Classification: Full Thickness Without Exposed Support Structures Wound Margin: Flat and Intact Exudate Amount: Medium Exudate Type: Serosanguineous Exudate Color: red, brown Foul Odor After Cleansing: No Slough/Fibrino Yes Wound Bed Granulation Amount: Small (1-33%) Exposed Structure Granulation Quality: Pink Fascia Exposed: No Necrotic Amount: Large (67-100%) Fat  Layer (Subcutaneous Tissue) Exposed: Yes Necrotic Quality: Eschar, Adherent Slough Tendon Exposed: No Muscle Exposed: No Joint Exposed: No Bone Exposed: No Treatment Notes Wound #7 (Upper Arm) Wound Laterality: Left, Posterior Cleanser Soap and Water Discharge Instruction: May shower and wash wound with dial antibacterial soap and water prior to dressing change. Wound Cleanser Discharge Instruction: Cleanse the wound with wound cleanser prior to applying a clean dressing using gauze sponges, not tissue or cotton balls. Peri-Wound Care Topical Primary Dressing Hydrofera Blue Ready Foam, 2.5 x2.5 in Discharge Instruction: Apply  to wound bed as instructed Secondary Dressing Bordered Gauze, 4x4 in Discharge Instruction: Apply over primary dressing as directed. Secured With Compression Wrap Compression Stockings Environmental education officer) Signed: 03/15/2022 5:59:48 PM By: Dellie Catholic RN Entered By: Dellie Catholic on 03/15/2022 13:06:02 -------------------------------------------------------------------------------- Wound Assessment Details Patient Name: Date of Service: Jacob [name] 03/15/2022 12:30 PM Medical Record Number: [id number] Patient Account Number: [id number] Date of Birth/Sex: Treating RN: July 27, 1951 (71 y.o. Jacob [name] Primary Care Kayshaun Polanco: Alain Honey Other Clinician: Referring Aubriella Perezgarcia: Treating Thamara Leger/Extender: Neil Crouch in Treatment: 2 Wound Status Wound Number: 8 Primary Diabetic Wound/Ulcer of the Lower Extremity Etiology: Wound Location: Right, Plantar Foot Wound Open Wounding Event: Blister Status: Date Acquired: 03/08/2022 Comorbid Cataracts, Glaucoma, Anemia, Chronic Obstructive Pulmonary Weeks Of Treatment: 1 History: Disease (COPD), Congestive Heart Failure, Coronary Artery Clustered Wound: No Disease, Hypertension, Myocardial Infarction, Hepatitis C, Type II Diabetes, Gout,  Osteoarthritis, Neuropathy Photos Wound Measurements Length: (cm) 0.5 Width: (cm) 0.5 Depth: (cm) 0.3 Area: (cm) 0.196 Volume: (cm) 0.059 % Reduction in Area: 82.2% % Reduction in Volume: 82.1% Epithelialization: None Undermining: Yes Starting Position (o'clock): 7 Ending Position (o'clock): 10 Maximum Distance: (cm) 0.3 Wound Description Classification: Grade 2 Wound Margin: Thickened Exudate Amount: Medium Exudate Type: Serosanguineous Exudate Color: red, brown Foul Odor After Cleansing: No Slough/Fibrino Yes Wound Bed Granulation Amount: Small (1-33%) Exposed Structure Granulation Quality: Red Fascia Exposed: No Necrotic Amount: Large (67-100%) Fat Layer (Subcutaneous Tissue) Exposed: No Necrotic Quality: Eschar, Adherent Slough Tendon Exposed: No Muscle Exposed: No Joint Exposed: No Bone Exposed: No Treatment Notes Wound #8 (Foot) Wound Laterality: Plantar, Right Cleanser Soap and Water Discharge Instruction: May shower and wash wound with dial antibacterial soap and water prior to dressing change. Wound Cleanser Discharge Instruction: Cleanse the wound with wound cleanser prior to applying a clean dressing using gauze sponges, not tissue or cotton balls. Peri-Wound Care Topical Primary Dressing Hydrofera Blue Ready Foam, 2.5 x2.5 in Discharge Instruction: Apply to wound bed as instructed Secondary Dressing Optifoam Non-Adhesive Dressing, 4x4 in Discharge Instruction: Apply over primary dressing as directed. Woven Gauze Sponges 2x2 in Discharge Instruction: Apply over primary dressing as directed. Secured With Conforming Stretch Gauze Bandage, Sterile 2x75 (in/in) Discharge Instruction: Secure with stretch gauze as directed. 83M Medipore Soft Cloth Surgical T 2x10 (in/yd) ape Discharge Instruction: Secure with tape as directed. Compression Wrap Compression Stockings Add-Ons Electronic Signature(s) Signed: 03/15/2022 5:59:48 PM By: Dellie Catholic  RN Entered By: Dellie Catholic on 03/15/2022 13:05:22 -------------------------------------------------------------------------------- Vitals Details Patient Name: Date of Service: Jacob [name]. 03/15/2022 12:30 PM Medical Record Number: [id number] Patient Account Number: [id number] Date of Birth/Sex: Treating RN: [birth year] (71 y.o. Jacob [name] Primary Care Sherae Santino: Alain Honey Other Clinician: Referring Chelcey Caputo: Treating Delorice Bannister/Extender: Neil Crouch in Treatment: 2 Vital Signs Time Taken: 12:47 Temperature (F): 98.0 Height (in): 74 Pulse (bpm): 77 Weight (lbs): 172 Respiratory Rate (breaths/min): 16 Body Mass Index (BMI): 22.1 Blood Pressure (mmHg): 174/84 Reference Range: 80 - 120 mg / dl Electronic Signature(s) Signed: 03/15/2022 5:59:48 PM By: Dellie Catholic RN Entered By: Dellie Catholic on 03/15/2022 12:48:46

## 2022-03-16 DIAGNOSIS — S91301A Unspecified open wound, right foot, initial encounter: Secondary | ICD-10-CM | POA: Diagnosis not present

## 2022-03-16 DIAGNOSIS — T8249XA Other complication of vascular dialysis catheter, initial encounter: Secondary | ICD-10-CM | POA: Diagnosis not present

## 2022-03-16 DIAGNOSIS — N186 End stage renal disease: Secondary | ICD-10-CM | POA: Diagnosis not present

## 2022-03-16 DIAGNOSIS — Z992 Dependence on renal dialysis: Secondary | ICD-10-CM | POA: Diagnosis not present

## 2022-03-16 DIAGNOSIS — D689 Coagulation defect, unspecified: Secondary | ICD-10-CM | POA: Diagnosis not present

## 2022-03-16 DIAGNOSIS — I10 Essential (primary) hypertension: Secondary | ICD-10-CM | POA: Diagnosis not present

## 2022-03-16 DIAGNOSIS — D631 Anemia in chronic kidney disease: Secondary | ICD-10-CM | POA: Diagnosis not present

## 2022-03-16 DIAGNOSIS — N2581 Secondary hyperparathyroidism of renal origin: Secondary | ICD-10-CM | POA: Diagnosis not present

## 2022-03-16 NOTE — H&P (View-Only) (Signed)
Postoperative Access Visit   History of Present Illness   Jacob [name] is a 71 y.o. year old male who presents for postoperative follow-up for: tunneled dialysis catheter insertion and Ligation of left upper arm AV fistula by Dr. Scot Dock on 02/08/22. This was ligated due to non healing left upper extremity wounds. The patient's wounds are mostly healed.His blisters on his fingers are healed. He does have one on his left elbow but he says he is not even sure if this is related to the fistula. He is going to wound care center for this.  The patient notes minimal steal symptoms. He still does have some tenderness and numbness in his left fingers. However, it is much better than it was. He explains that his right IJ TDC was replaced 2 weeks ago because it was non working. This was done at [facility]. He otherwise denies any issues with RUE.   He currently dialyzes on MWF via a right IJ TDC at the [address] location   Physical Examination   Vitals:   03/24/22 1228  BP: 122/64  Pulse: 66  Resp: 20  Temp: 98.2 F (36.8 C)  TempSrc: Temporal  SpO2: 99%  Weight: 179 lb 11.2 oz (81.5 kg)  Height: '5\' 9"'$  (1.753 m)   Body mass index is 26.54 kg/m.  left arm Incision is well healed, 2+ radial pulse, hand grip is 5/5, sensation in digits is intact, no palpable thrill as fistula is ligated. Right hand is warm and well perfused. 2+ radial pulse    Non invasive vascular lab:03/24/22 [facility] [place] Upper extremity arterial duplex Right: No obstruction visualized in the right upper extremity. High brachial bifurcation.   [facility] [place] Upper extremity vein mapping: +-----------------+-------------+----------+--------------+  Right Cephalic   Diameter (cm)Depth (cm)   Findings     +-----------------+-------------+----------+--------------+  Shoulder                                not visualized  +-----------------+-------------+----------+--------------+  Prox upper arm                           not visualized  +-----------------+-------------+----------+--------------+  Mid upper arm                           not visualized  +-----------------+-------------+----------+--------------+  Dist upper arm                          not visualized  +-----------------+-------------+----------+--------------+  Antecubital fossa    0.17                               +-----------------+-------------+----------+--------------+  Prox forearm         0.30                 branching     +-----------------+-------------+----------+--------------+  Mid forearm          0.19                               +-----------------+-------------+----------+--------------+  Dist forearm         0.17                               +-----------------+-------------+----------+--------------+   +-----------------+-------------+----------+--------+  Right Basilic    Diameter (cm)Depth (cm)Findings  +-----------------+-------------+----------+--------+  Prox upper arm       0.32                         +-----------------+-------------+----------+--------+  Mid upper arm        0.29                         +-----------------+-------------+----------+--------+  Dist upper arm       0.36                         +-----------------+-------------+----------+--------+  Antecubital fossa    0.27                         +-----------------+-------------+----------+--------+  Prox forearm         0.20                         +-----------------+-------------+----------+--------+   Summary: Right: Patent cephalic and basilic veins. The upper arm cephalic vein was not well visualized. The forearm segment of the cephalic vein appears to drain into a brachial vein at the antecubital fossa.   Medical Decision Making   Jacob [name] is a 71 y.o. year old male who presents s/p: tunneled dialysis catheter insertion and Ligation of left upper arm AV fistula by Dr.  Scot Dock on 02/08/22.His left upper extremity wounds are almost completely healed. He still does have some tingling in his left hand, but it has improved.  - Duplex today shows patient right upper extremity arteries with triphasic flow. Marginal right upper arm Basilic vein for conduit.  Will schedule him for right upper extremity AVF vs AVG. Discussed that if we are able to use his vein that he will likely need a second surgery to follow for transposition. The Risks/ benefits/ alternatives to intervention were discussed with patient and he is agreeable to proceed Will try to arrange his surgery in the near future on a non dialysis day with Dr. Edwena Bunde, PA-C [facility] Office: [phone]  Clinic MD: Scot Dock

## 2022-03-16 NOTE — Progress Notes (Unsigned)
Postoperative Access Visit   History of Present Illness   Jacob [name] is a 71 y.o. year old male who presents for postoperative follow-up for: tunneled dialysis catheter insertion and Ligation of left upper arm AV fistula by Dr. Scot Dock on 02/08/22. This was ligated due to non healing left upper extremity wounds. The patient's wounds are mostly healed.His blisters on his fingers are healed. He does have one on his left elbow but he says he is not even sure if this is related to the fistula. He is going to wound care center for this.  The patient notes minimal steal symptoms. He still does have some tenderness and numbness in his left fingers. However, it is much better than it was. He explains that his right IJ TDC was replaced 2 weeks ago because it was non working. This was done at [facility]. He otherwise denies any issues with RUE.   He currently dialyzes on MWF via a right IJ TDC at the [address] location   Physical Examination   Vitals:   03/24/22 1228  BP: 122/64  Pulse: 66  Resp: 20  Temp: 98.2 F (36.8 C)  TempSrc: Temporal  SpO2: 99%  Weight: 179 lb 11.2 oz (81.5 kg)  Height: '5\' 9"'$  (1.753 m)   Body mass index is 26.54 kg/m.  left arm Incision is well healed, 2+ radial pulse, hand grip is 5/5, sensation in digits is intact, no palpable thrill as fistula is ligated. Right hand is warm and well perfused. 2+ radial pulse    Non invasive vascular lab:03/24/22 [facility] [place] Upper extremity arterial duplex Right: No obstruction visualized in the right upper extremity. High brachial bifurcation.   [facility] [place] Upper extremity vein mapping: +-----------------+-------------+----------+--------------+  Right Cephalic   Diameter (cm)Depth (cm)   Findings     +-----------------+-------------+----------+--------------+  Shoulder                                not visualized  +-----------------+-------------+----------+--------------+  Prox upper arm                           not visualized  +-----------------+-------------+----------+--------------+  Mid upper arm                           not visualized  +-----------------+-------------+----------+--------------+  Dist upper arm                          not visualized  +-----------------+-------------+----------+--------------+  Antecubital fossa    0.17                               +-----------------+-------------+----------+--------------+  Prox forearm         0.30                 branching     +-----------------+-------------+----------+--------------+  Mid forearm          0.19                               +-----------------+-------------+----------+--------------+  Dist forearm         0.17                               +-----------------+-------------+----------+--------------+   +-----------------+-------------+----------+--------+  Right Basilic    Diameter (cm)Depth (cm)Findings  +-----------------+-------------+----------+--------+  Prox upper arm       0.32                         +-----------------+-------------+----------+--------+  Mid upper arm        0.29                         +-----------------+-------------+----------+--------+  Dist upper arm       0.36                         +-----------------+-------------+----------+--------+  Antecubital fossa    0.27                         +-----------------+-------------+----------+--------+  Prox forearm         0.20                         +-----------------+-------------+----------+--------+   Summary: Right: Patent cephalic and basilic veins. The upper arm cephalic vein was not well visualized. The forearm segment of the cephalic vein appears to drain into a brachial vein at the antecubital fossa.   Medical Decision Making   Jacob [name] is a 71 y.o. year old male who presents s/p: tunneled dialysis catheter insertion and Ligation of left upper arm AV fistula by Dr.  Scot Dock on 02/08/22.His left upper extremity wounds are almost completely healed. He still does have some tingling in his left hand, but it has improved.  - Duplex today shows patient right upper extremity arteries with triphasic flow. Marginal right upper arm Basilic vein for conduit.  Will schedule him for right upper extremity AVF vs AVG. Discussed that if we are able to use his vein that he will likely need a second surgery to follow for transposition. The Risks/ benefits/ alternatives to intervention were discussed with patient and he is agreeable to proceed Will try to arrange his surgery in the near future on a non dialysis day with Dr. Edwena Bunde, PA-C [facility] Office: [phone]  Clinic MD: Scot Dock

## 2022-03-17 ENCOUNTER — Ambulatory Visit (HOSPITAL_COMMUNITY): Admission: RE | Admit: 2022-03-17 | Payer: Medicare Other | Source: Ambulatory Visit

## 2022-03-17 NOTE — Telephone Encounter (Signed)
error 

## 2022-03-18 DIAGNOSIS — Z992 Dependence on renal dialysis: Secondary | ICD-10-CM | POA: Diagnosis not present

## 2022-03-18 DIAGNOSIS — D689 Coagulation defect, unspecified: Secondary | ICD-10-CM | POA: Diagnosis not present

## 2022-03-18 DIAGNOSIS — N186 End stage renal disease: Secondary | ICD-10-CM | POA: Diagnosis not present

## 2022-03-18 DIAGNOSIS — D631 Anemia in chronic kidney disease: Secondary | ICD-10-CM | POA: Diagnosis not present

## 2022-03-18 DIAGNOSIS — T8249XA Other complication of vascular dialysis catheter, initial encounter: Secondary | ICD-10-CM | POA: Diagnosis not present

## 2022-03-18 DIAGNOSIS — N2581 Secondary hyperparathyroidism of renal origin: Secondary | ICD-10-CM | POA: Diagnosis not present

## 2022-03-21 DIAGNOSIS — T8249XA Other complication of vascular dialysis catheter, initial encounter: Secondary | ICD-10-CM | POA: Diagnosis not present

## 2022-03-21 DIAGNOSIS — D631 Anemia in chronic kidney disease: Secondary | ICD-10-CM | POA: Diagnosis not present

## 2022-03-21 DIAGNOSIS — Z992 Dependence on renal dialysis: Secondary | ICD-10-CM | POA: Diagnosis not present

## 2022-03-21 DIAGNOSIS — N186 End stage renal disease: Secondary | ICD-10-CM | POA: Diagnosis not present

## 2022-03-21 DIAGNOSIS — N2581 Secondary hyperparathyroidism of renal origin: Secondary | ICD-10-CM | POA: Diagnosis not present

## 2022-03-21 DIAGNOSIS — D689 Coagulation defect, unspecified: Secondary | ICD-10-CM | POA: Diagnosis not present

## 2022-03-22 ENCOUNTER — Encounter: Payer: Self-pay | Admitting: Family Medicine

## 2022-03-22 ENCOUNTER — Encounter (HOSPITAL_BASED_OUTPATIENT_CLINIC_OR_DEPARTMENT_OTHER): Payer: Medicare Other | Admitting: General Surgery

## 2022-03-22 ENCOUNTER — Ambulatory Visit (INDEPENDENT_AMBULATORY_CARE_PROVIDER_SITE_OTHER): Payer: Medicare Other | Admitting: Family Medicine

## 2022-03-22 VITALS — BP 112/74 | HR 77 | Temp 97.0°F | Resp 18 | Ht 69.0 in | Wt 181.6 lb

## 2022-03-22 DIAGNOSIS — F1190 Opioid use, unspecified, uncomplicated: Secondary | ICD-10-CM | POA: Diagnosis not present

## 2022-03-22 DIAGNOSIS — E1169 Type 2 diabetes mellitus with other specified complication: Secondary | ICD-10-CM

## 2022-03-22 DIAGNOSIS — F17210 Nicotine dependence, cigarettes, uncomplicated: Secondary | ICD-10-CM | POA: Diagnosis not present

## 2022-03-22 DIAGNOSIS — Z720 Tobacco use: Secondary | ICD-10-CM | POA: Diagnosis not present

## 2022-03-22 DIAGNOSIS — M545 Low back pain, unspecified: Secondary | ICD-10-CM | POA: Diagnosis not present

## 2022-03-22 DIAGNOSIS — E1142 Type 2 diabetes mellitus with diabetic polyneuropathy: Secondary | ICD-10-CM | POA: Diagnosis not present

## 2022-03-22 DIAGNOSIS — N186 End stage renal disease: Secondary | ICD-10-CM

## 2022-03-22 DIAGNOSIS — E11621 Type 2 diabetes mellitus with foot ulcer: Secondary | ICD-10-CM | POA: Diagnosis not present

## 2022-03-22 DIAGNOSIS — G8929 Other chronic pain: Secondary | ICD-10-CM | POA: Diagnosis not present

## 2022-03-22 DIAGNOSIS — I10 Essential (primary) hypertension: Secondary | ICD-10-CM

## 2022-03-22 DIAGNOSIS — N2581 Secondary hyperparathyroidism of renal origin: Secondary | ICD-10-CM | POA: Diagnosis not present

## 2022-03-22 DIAGNOSIS — I132 Hypertensive heart and chronic kidney disease with heart failure and with stage 5 chronic kidney disease, or end stage renal disease: Secondary | ICD-10-CM | POA: Diagnosis not present

## 2022-03-22 DIAGNOSIS — Z992 Dependence on renal dialysis: Secondary | ICD-10-CM | POA: Diagnosis not present

## 2022-03-22 DIAGNOSIS — B20 Human immunodeficiency virus [HIV] disease: Secondary | ICD-10-CM

## 2022-03-22 DIAGNOSIS — E785 Hyperlipidemia, unspecified: Secondary | ICD-10-CM

## 2022-03-22 DIAGNOSIS — I5042 Chronic combined systolic (congestive) and diastolic (congestive) heart failure: Secondary | ICD-10-CM | POA: Diagnosis not present

## 2022-03-22 DIAGNOSIS — J449 Chronic obstructive pulmonary disease, unspecified: Secondary | ICD-10-CM | POA: Diagnosis not present

## 2022-03-22 DIAGNOSIS — L98499 Non-pressure chronic ulcer of skin of other sites with unspecified severity: Secondary | ICD-10-CM | POA: Diagnosis not present

## 2022-03-22 DIAGNOSIS — E1122 Type 2 diabetes mellitus with diabetic chronic kidney disease: Secondary | ICD-10-CM | POA: Diagnosis not present

## 2022-03-22 DIAGNOSIS — Z794 Long term (current) use of insulin: Secondary | ICD-10-CM

## 2022-03-22 DIAGNOSIS — L98492 Non-pressure chronic ulcer of skin of other sites with fat layer exposed: Secondary | ICD-10-CM | POA: Diagnosis not present

## 2022-03-22 DIAGNOSIS — I255 Ischemic cardiomyopathy: Secondary | ICD-10-CM | POA: Diagnosis not present

## 2022-03-22 DIAGNOSIS — Z79899 Other long term (current) drug therapy: Secondary | ICD-10-CM | POA: Diagnosis not present

## 2022-03-22 DIAGNOSIS — I509 Heart failure, unspecified: Secondary | ICD-10-CM

## 2022-03-22 DIAGNOSIS — L97512 Non-pressure chronic ulcer of other part of right foot with fat layer exposed: Secondary | ICD-10-CM | POA: Diagnosis not present

## 2022-03-22 NOTE — Progress Notes (Signed)
[name], [name] ([id number]) Visit Report for 03/22/2022 Chief Complaint Document Details Patient Name: Date of Service: Jacob [name], Jacob [name] 03/22/2022 12:30 PM Medical Record Number: [id number] Patient Account Number: [id number] Date of Birth/Sex: Treating RN: [birth year] (71 y.o. [name] Primary Care Provider: Alain Honey Other Clinician: Referring Provider: Treating Provider/Extender: Neil Crouch in Treatment: 3 Information Obtained from: Patient Chief Complaint Patient seen for complaints of Non-Healing Wounds x 3 Electronic Signature(s) Signed: 03/22/2022 1:44:54 PM By: Fredirick Maudlin MD FACS Entered By: Fredirick Maudlin on 03/22/2022 13:44:54 -------------------------------------------------------------------------------- Debridement Details Patient Name: Date of Service: Jacob [name]. 03/22/2022 12:30 PM Medical Record Number: [id number] Patient Account Number: [id number] Date of Birth/Sex: Treating RN: 02-21-1951 (71 y.o. [name] Primary Care Provider: Alain Honey Other Clinician: Referring Provider: Treating Provider/Extender: Neil Crouch in Treatment: 3 Debridement Performed for Assessment: Wound #7 Left,Posterior Upper Arm Performed By: Physician Fredirick Maudlin, MD Debridement Type: Debridement Level of Consciousness (Pre-procedure): Awake and Alert Pre-procedure Verification/Time Out Yes - 13:15 Taken: Start Time: 13:15 Pain Control: Other : Benzocaine 20% spray T Area Debrided (L x W): otal 0.5 (cm) x 0.4 (cm) = 0.2 (cm) Tissue and other material debrided: Non-Viable, Eschar, Slough, Slough Level: Non-Viable Tissue Debridement Description: Selective/Open Wound Instrument: Curette Bleeding: Minimum Hemostasis Achieved: Pressure End Time: 13:17 Procedural Pain: 0 Post Procedural Pain: 0 Response to Treatment: Procedure was tolerated well Level of Consciousness (Post-  Awake and Alert procedure): Post Debridement Measurements of Total Wound Length: (cm) 0.5 Width: (cm) 0.4 Depth: (cm) 0.1 Volume: (cm) 0.016 Character of Wound/Ulcer Post Debridement: Improved Post Procedure Diagnosis Same as Pre-procedure Electronic Signature(s) Signed: 03/22/2022 3:01:24 PM By: Fredirick Maudlin MD FACS Signed: 03/22/2022 4:54:57 PM By: Dellie Catholic RN Entered By: Dellie Catholic on 03/22/2022 13:19:12 -------------------------------------------------------------------------------- Debridement Details Patient Name: Date of Service: Jacob [name]. 03/22/2022 12:30 PM Medical Record Number: [id number] Patient Account Number: [id number] Date of Birth/Sex: Treating RN: 03-Jun-1951 (71 y.o. [name] Primary Care Provider: Alain Honey Other Clinician: Referring Provider: Treating Provider/Extender: Neil Crouch in Treatment: 3 Debridement Performed for Assessment: Wound #8 Right,Plantar Foot Performed By: Physician Fredirick Maudlin, MD Debridement Type: Debridement Severity of Tissue Pre Debridement: Fat layer exposed Level of Consciousness (Pre-procedure): Awake and Alert Pre-procedure Verification/Time Out Yes - 13:15 Taken: Start Time: 13:15 Pain Control: Other : Benzocaine 20% spray T Area Debrided (L x W): otal 0.4 (cm) x 0.7 (cm) = 0.28 (cm) Tissue and other material debrided: Non-Viable, Callus, Slough, Slough Level: Non-Viable Tissue Debridement Description: Selective/Open Wound Instrument: Curette Bleeding: Minimum Hemostasis Achieved: Pressure End Time: 13:17 Procedural Pain: 0 Post Procedural Pain: 0 Response to Treatment: Procedure was tolerated well Level of Consciousness (Post- Awake and Alert procedure): Post Debridement Measurements of Total Wound Length: (cm) 0.4 Width: (cm) 0.7 Depth: (cm) 0.3 Volume: (cm) 0.066 Character of Wound/Ulcer Post Debridement: Improved Severity of Tissue  Post Debridement: Fat layer exposed Post Procedure Diagnosis Same as Pre-procedure Electronic Signature(s) Signed: 03/22/2022 3:01:24 PM By: Fredirick Maudlin MD FACS Signed: 03/22/2022 4:54:57 PM By: Dellie Catholic RN Entered By: Dellie Catholic on 03/22/2022 13:20:26 -------------------------------------------------------------------------------- HPI Details Patient Name: Date of Service: Jacob [name]. 03/22/2022 12:30 PM Medical Record Number: [id number] Patient Account Number: [id number] Date of Birth/Sex: Treating RN: 25-Jan-1951 (71 y.o. [name] Primary Care Provider: Alain Honey Other Clinician: Referring Provider: Treating Provider/Extender: Neil Crouch in Treatment:  3 History of Present Illness Location: 2 ulcers noted to the left hallux Quality: the patient denies pain Duration: these were originally blisters towards the end of February and opened in early March Context: these wounds are secondary to pressure; he got new shoes in February and also started wearing a toe cover/sleeve Modifying Factors: pressure and friction are exacerbating factor HPI Description: 01/12/17- Mr. Hoque arrives for initial evaluation of 2 ulcers to his left hallux. He states that back in February he did receive a new pair of diabetic shoes and at that time he started wearing toe cover/sleeves and noticed development of a blister to the dorsal and plantar aspect. He has been seen at [facility] by both Dr. Amalia Hailey and Dr. Jacqualyn Posey. He last saw Dr.Wagoner on 3/8 at which time he was prescribed Silvadene and Keflex. He completed that dose of Keflex and states that he got a phone call to extend the Keflex prior to this appointment. He has a follow-up with Dr. Amalia Hailey on 3/26. He has been wearing a surgical shoe, no significant offloading to the plantar/medial ulcer but the strap does not extend over the dorsal ulcer. Topically he has been applying the  Silvadene daily and a dry dressing. He is a diabetic, on insulin, with a recent A1c of 6.2% (11/10/16). He is a current smoker, 0.5 PPD. He has no known diagnosis of arterial insufficiency. 01/20/17- The patient arrives for follow up evaluation of his ulcers to the left hallux. He admits to continuing to smoke approximately 0.5ppd. He states that his diet has been somewhat uncontrolled with higher than normal glucose levels, this seems circumstantial as he had a death in the family as food and schedule has been disrupted. he admits to wearing the surgical show with offloading modifications while out of the home, but has been ambulating barefoot around the home. he saw Dr. Amalia Hailey yesterday for toenail trimming and callus shaving to the right plantar foot and investigation of his ulcers. 01/26/17- he is here for follow-up evaluation of his left hallux ulcers. He states he has been significantly more compliant in the use of his offloading surgical shoe, with the exception of "getting up for a glass of water" he states that he has worn it with any ambulation. He states he has decreased his smoking too, admitting to only a cigarette since yesterday. He states his blood sugar this morning was 147 and yesterday was 116. He is voicing no complaints or concerns 02/02/17- He is here for follow-up evaluation of his left hallux ulcers. The dorsal ulcer is crusted over/healed. He states he has minimized his ambulation over this past week, much less than the previous week. He states his glucose levels have been between 120-150 about half of the time, otherwise greater than 150. He cannot correlate any specific dietary changes. He does have an appointment with a dietitian next month. He was advised to keep a log of his glucose levels and his diet for that appointment. 02/06/17; patient comes in today for first application of the total contact cast. Her intake nurse reported the wounds look better therefore I did not actually  see these before application of the cast. He'll return on Thursday for reapplication in the standard fashion 02/10/27- he is here for follow-up evaluation of his left plantar hallux ulcer. He came in Monday for application of a total contact cast. He states that he was tolerating that Monday, but noticed on Tuesday that his foot began to slide in the cast. He  also is complaining of the heaviness of the cast, as he ambulates as his primary form of transportation. He continues to smoke,. He has not contacted his cardiologist regarding Nicorette gum. He states his blood sugars have been slightly elevated, this morning being 206. He states that he will complete an antibiotic tonight. He states this was prescribed by Dr. Jacqualyn Posey. He states that he has refilled this prescription twice, without direction to continue taking this medication. 02/16/17; patient wound about the same as last week per her description. Rolled edges of callus scan and nonviable subcutaneous tissue. Using Prisma. Apparently used topical antibiotic instead of the Prisma over the course of this week. He has not a total contact cast candidate secondary to ambulation needs and unsteadiness on his feet per discussion with our intake nurse 03/02/17 patient requires debridement. He has a small wound on the right great toe which probes however does not approach bone. What I can see of the base of this wound looks reasonably healthy and I think this is largely in offloading issue with the second toe crossing over the first. He did not tolerate a total contact cast has balance issues 5/171/8. 0.2x0.2x0.2 less depth. Using prisma 03/16/17; no major change. Still a small probing wound with copious amounts of surrounding callus skin nonviable subcutaneous tissue all of which debrided. Still using Prisma. I suspect the dimensions post debridement are about the same as listed above 0.2 x 0.2 x 0.2 03/23/17; no major change using Prisma. Once again a  large amount of callus nonviable subcutaneous tissue around the wound. I went over the offloading issues with the patient and the nurse. He did not tolerate a total contact cast he uses public transportation among other issues. 03/30/17; no major change using Prisma. Once again a large amount of callus and nonviable tissue around the wound requires debridement. Saw Dr. Amalia Hailey who pared calluses on the other foot. 04/06/17; the wound is much smaller there is callus around this although I think I can see the base of the wound and I elected not to do any debridement. 04/13/17 this is a wound I did not debridement last week left callus intact. He arrives today with the area much in the same predicament however this time I remove the callus to find a deep probing hole. This is disappointing this is not go to bone. We have been using Prisma. 04/20/17 plantar left first toe. Again although a lot of circumferential callus around the wound which requires debridement however this does not appear to be a probing area. Culture I did last week show coag-negative staph. I thought this was probably not something that was worth treating but In the interim he was seen by his primary care with parotitis. He is now on clindamycin and Cipro and the Staphylococcus we cultured is Cipro sensitive [lugdunensis} 04/27/17; plantar left first toe. Again a lot of circumferential callus around a small open area. Removing the callus also nonviable subcutaneous tissue. The wound looks better today. He has completed his antibiotics for the parotitis. I didn't think additional antibiotics were necessary in terms of the wound 05/11/17; plantar first toe. I thought this might be closed this week however once again he has a deep probing small hole. Using pickups and a scalpel debridement of thick callus and subcutaneous tissue from around the wound reveals a depth of about 0.4 cm. We have not been able to offload the area. There is no obvious  infection here. No evidence of ischemia 05/18/17; since  his last visit he's been in hospital for oral surgery apparently to remove a retained surgical screw. He has swelling of his jaw and has some discomfort. He arrives today with the plantar left first toe in the same predicament. No major change. Each time he comes in here he has thick adherent callus 05/25/17; arrives today with roughly the same condition of the plantar left great toe. Perhaps somewhat better less callus. There are no options for offloading. He is on his feet a lot and I think this is largely a pressure issue X-ray I did last week showed no evidence of osteomyelitis. He has a toe separator as the great toe tends to ride under the second toe when he is walking. I'm hopeful that this will allow for a little less pressure on the plantar aspect of the left great toe 06/07/2017 -- he is working hard on giving up smoking and other than that has been wearing his diabetic shoes and is also trying to offload as much as possible. 06/15/17; patient arrived in clinic today with a difficult area on his left medial great toe closed over. There is still amount of callus on this however I don't believe there is any underlying open wound. He has custom shoes with inserts. I still think he is going to need to offload this area going forward. He also follows with Dr. Amalia Hailey of podiatry for routine foot care READMISSION 11/01/16; patient arrives in clinic today with a five-day history of a new area on the previously problematic left great toe. He is wearing his diabetic shoes and he apparently has a new pair coming. The patient is a type II diabetic with neuropathy. He follows with Dr. Amalia Hailey of podiatry for pre-ulcerative callus on his bilateral feet last seen on 08/24/17. He has developed a large blister on top of his left great toe this is since gone down somewhat. He is not been dressing this specifically. He does not have a history of significant PAD  previous ABIs in this clinic were1.1 11/06/17; left great toe has epithelialized since last time. I think this was caused by a insoles an issue pushing the toe against the dorsal aspect of the shoe itself has new diabetic shoes coming hopefully will have more forefoot depth. READMISSION 03/01/2022 This is a now 71 year old man with a past medical history significant for type 2 diabetes mellitus with end-stage renal disease on hemodialysis as as well as congestive heart failure, ischemic cardiomyopathy, hypertension, and prior diabetic foot ulcers. He has had multiple issues with his hemodialysis access. He had a prior left radiocephalic fistula that failed. In October 2020, he had a left brachiobasilic fistula. He apparently underwent a procedure at an outside vascular office on November 09, 2021. Subsequent to that, he developed a wound on his left posterior forearm just distal to the elbow as well as wounds on 2 of his left digits. He was diagnosed with steal syndrome and recently underwent ligation of his fistula with placement of a hemodialysis catheter. He is here today for assistance in healing the wounds that developed related to steal syndrome. He says that he has been applying Neosporin to the sites and they are fairly sensitive to the touch. 03/08/2022: The wounds on his fingers are much smaller and have just a little bit of accumulated thin eschar. The wound on his left posterior forearm is also smaller but has accumulated both slough and eschar. He also has a thick callus on his right fifth metatarsal head plantar  surface and unfortunately, it appears that there is an open wound underneath the callus. We have been using Iodosorb with Hydrofera Blue to all of his open wounds. Pain is much improved from last week. 03/15/2022: The wounds on his fingers have healed. The wound on his left posterior forearm is smaller and has a nice layer of granulation tissue with just a bit of accumulated slough.  He has built up additional callus around the right fifth metatarsal head plantar surface, but the underlying wound has a good surface with granulation tissue present. We have been using Iodosorb with Hydrofera Blue. He did not endorse any pain this week. 03/22/2022: The wound on his left posterior forearm continues to contract. There is good granulation tissue present and just a little bit of periwound eschar and overlying slough. The right fifth metatarsal head wound has once again accumulated some callus and there is slough within the wound. The intake nurse measured a area of undermining from 7-10 o'clock. Electronic Signature(s) Signed: 03/22/2022 1:45:43 PM By: Fredirick Maudlin MD FACS Entered By: Fredirick Maudlin on 03/22/2022 13:45:43 -------------------------------------------------------------------------------- Physical Exam Details Patient Name: Date of Service: Jacob [name]. 03/22/2022 12:30 PM Medical Record Number: [id number] Patient Account Number: [id number] Date of Birth/Sex: Treating RN: [birth year] (71 y.o. [name] Primary Care Provider: Alain Honey Other Clinician: Referring Provider: Treating Provider/Extender: Neil Crouch in Treatment: 3 Constitutional . . . . No acute distress. Notes 03/22/2022: The wound on his left posterior forearm continues to contract. There is good granulation tissue present and just a little bit of periwound eschar and overlying slough. The right fifth metatarsal head wound has once again accumulated some callus and there is slough within the wound. The intake nurse measured a area of undermining from 7-10 o'clock. Electronic Signature(s) Signed: 03/22/2022 1:46:17 PM By: Fredirick Maudlin MD FACS Entered By: Fredirick Maudlin on 03/22/2022 13:46:17 -------------------------------------------------------------------------------- Physician Orders Details Patient Name: Date of Service: Jacob [name]. 03/22/2022 12:30 PM Medical Record Number: [id number] Patient Account Number: [id number] Date of Birth/Sex: Treating RN: 1951-01-31 (70 y.o. [name] Primary Care Provider: Alain Honey Other Clinician: Referring Provider: Treating Provider/Extender: Neil Crouch in Treatment: 3 Verbal / Phone Orders: No Diagnosis Coding ICD-10 Coding Code Description L98.499 Non-pressure chronic ulcer of skin of other sites with unspecified severity E11.22 Type 2 diabetes mellitus with diabetic chronic kidney disease J44.9 Chronic obstructive pulmonary disease, unspecified I50.42 Chronic combined systolic (congestive) and diastolic (congestive) heart failure I10 Essential (primary) hypertension N25.81 Secondary hyperparathyroidism of renal origin N18.6 End stage renal disease B20 Human immunodeficiency virus [HIV] disease L97.512 Non-pressure chronic ulcer of other part of right foot with fat layer exposed Follow-up Appointments ppointment in 1 week. - Dr Celine Ahr Room 3 Tuesday 03/29/22 at 11 am Return A Bathing/ Shower/ Hygiene Other Bathing/Shower/Hygiene Orders/Instructions: - Change dressing after bathing Wound Treatment Wound #7 - Upper Arm Wound Laterality: Left, Posterior Cleanser: Soap and Water Every Other Day/30 Days Discharge Instructions: May shower and wash wound with dial antibacterial soap and water prior to dressing change. Cleanser: Wound Cleanser (Generic) Every Other Day/30 Days Discharge Instructions: Cleanse the wound with wound cleanser prior to applying a clean dressing using gauze sponges, not tissue or cotton balls. Prim Dressing: Hydrofera Blue Ready Foam, 2.5 x2.5 in (Generic) Every Other Day/30 Days ary Discharge Instructions: Apply to wound bed as instructed Secondary Dressing: Bordered Gauze, 4x4 in (Generic) Every Other Day/30 Days Discharge Instructions: Apply  over primary dressing as directed. Wound #8 - Foot Wound  Laterality: Plantar, Right Cleanser: Soap and Water Every Other Day/30 Days Discharge Instructions: May shower and wash wound with dial antibacterial soap and water prior to dressing change. Cleanser: Wound Cleanser (Generic) Every Other Day/30 Days Discharge Instructions: Cleanse the wound with wound cleanser prior to applying a clean dressing using gauze sponges, not tissue or cotton balls. Prim Dressing: Hydrofera Blue Ready Foam, 2.5 x2.5 in (Generic) Every Other Day/30 Days ary Discharge Instructions: Apply to wound bed as instructed Secondary Dressing: Optifoam Non-Adhesive Dressing, 4x4 in (Generic) Every Other Day/30 Days Discharge Instructions: Apply over primary dressing as directed. Secondary Dressing: Woven Gauze Sponges 2x2 in (Generic) Every Other Day/30 Days Discharge Instructions: Apply over primary dressing as directed. Secured With: Child psychotherapist, Sterile 2x75 (in/in) (Generic) Every Other Day/30 Days Discharge Instructions: Secure with stretch gauze as directed. Secured With: 68M Medipore Public affairs consultant Surgical T 2x10 (in/yd) (Generic) Every Other Day/30 Days ape Discharge Instructions: Secure with tape as directed. Electronic Signature(s) Signed: 03/22/2022 1:46:39 PM By: Fredirick Maudlin MD FACS Entered By: Fredirick Maudlin on 03/22/2022 13:46:39 -------------------------------------------------------------------------------- Problem List Details Patient Name: Date of Service: Jacob [name]. 03/22/2022 12:30 PM Medical Record Number: [id number] Patient Account Number: [id number] Date of Birth/Sex: Treating RN: 19-Jul-1951 (71 y.o. [name] Primary Care Provider: Alain Honey Other Clinician: Referring Provider: Treating Provider/Extender: Neil Crouch in Treatment: 3 Active Problems ICD-10 Encounter Code Description Active Date MDM Diagnosis L98.499 Non-pressure chronic ulcer of skin of other sites with  unspecified severity 03/01/2022 No Yes E11.22 Type 2 diabetes mellitus with diabetic chronic kidney disease 03/01/2022 No Yes J44.9 Chronic obstructive pulmonary disease, unspecified 03/01/2022 No Yes I50.42 Chronic combined systolic (congestive) and diastolic (congestive) heart failure 03/01/2022 No Yes I10 Essential (primary) hypertension 03/01/2022 No Yes N25.81 Secondary hyperparathyroidism of renal origin 03/01/2022 No Yes N18.6 End stage renal disease 03/01/2022 No Yes B20 Human immunodeficiency virus [HIV] disease 03/01/2022 No Yes L97.512 Non-pressure chronic ulcer of other part of right foot with fat layer exposed 03/08/2022 No Yes Inactive Problems Resolved Problems Electronic Signature(s) Signed: 03/22/2022 1:44:41 PM By: Fredirick Maudlin MD FACS Entered By: Fredirick Maudlin on 03/22/2022 13:44:41 -------------------------------------------------------------------------------- Progress Note Details Patient Name: Date of Service: Jacob [name]. 03/22/2022 12:30 PM Medical Record Number: [id number] Patient Account Number: [id number] Date of Birth/Sex: Treating RN: 1951/09/15 (71 y.o. [name] Primary Care Provider: Alain Honey Other Clinician: Referring Provider: Treating Provider/Extender: Neil Crouch in Treatment: 3 Subjective Chief Complaint Information obtained from Patient Patient seen for complaints of Non-Healing Wounds x 3 History of Present Illness (HPI) The following HPI elements were documented for the patient's wound: Location: 2 ulcers noted to the left hallux Quality: the patient denies pain Duration: these were originally blisters towards the end of February and opened in early March Context: these wounds are secondary to pressure; he got new shoes in February and also started wearing a toe cover/sleeve Modifying Factors: pressure and friction are exacerbating factor 01/12/17- Jacob [name] arrives for initial evaluation of 2 ulcers  to his left hallux. He states that back in February he did receive a new pair of diabetic shoes and at that time he started wearing toe cover/sleeves and noticed development of a blister to the dorsal and plantar aspect. He has been seen at [facility] by both Dr. Amalia Hailey and Dr. Jacqualyn Posey. He last saw Dr.Wagoner on 3/8 at  which time he was prescribed Silvadene and Keflex. He completed that dose of Keflex and states that he got a phone call to extend the Keflex prior to this appointment. He has a follow-up with Dr. Amalia Hailey on 3/26. He has been wearing a surgical shoe, no significant offloading to the plantar/medial ulcer but the strap does not extend over the dorsal ulcer. T opically he has been applying the Silvadene daily and a dry dressing. He is a diabetic, on insulin, with a recent A1c of 6.2% (11/10/16). He is a current smoker, 0.5 PPD. He has no known diagnosis of arterial insufficiency. 01/20/17- The patient arrives for follow up evaluation of his ulcers to the left hallux. He admits to continuing to smoke approximately 0.5ppd. He states that his diet has been somewhat uncontrolled with higher than normal glucose levels, this seems circumstantial as he had a death in the family as food and schedule has been disrupted. he admits to wearing the surgical show with offloading modifications while out of the home, but has been ambulating barefoot around the home. he saw Dr. Amalia Hailey yesterday for toenail trimming and callus shaving to the right plantar foot and investigation of his ulcers. 01/26/17- he is here for follow-up evaluation of his left hallux ulcers. He states he has been significantly more compliant in the use of his offloading surgical shoe, with the exception of "getting up for a glass of water" he states that he has worn it with any ambulation. He states he has decreased his smoking too, admitting to only a cigarette since yesterday. He states his blood sugar this morning was 147 and yesterday  was 116. He is voicing no complaints or concerns 02/02/17- He is here for follow-up evaluation of his left hallux ulcers. The dorsal ulcer is crusted over/healed. He states he has minimized his ambulation over this past week, much less than the previous week. He states his glucose levels have been between 120-150 about half of the time, otherwise greater than 150. He cannot correlate any specific dietary changes. He does have an appointment with a dietitian next month. He was advised to keep a log of his glucose levels and his diet for that appointment. 02/06/17; patient comes in today for first application of the total contact cast. Her intake nurse reported the wounds look better therefore I did not actually see these before application of the cast. He'll return on Thursday for reapplication in the standard fashion 02/10/27- he is here for follow-up evaluation of his left plantar hallux ulcer. He came in Monday for application of a total contact cast. He states that he was tolerating that Monday, but noticed on Tuesday that his foot began to slide in the cast. He also is complaining of the heaviness of the cast, as he ambulates as his primary form of transportation. He continues to smoke,. He has not contacted his cardiologist regarding Nicorette gum. He states his blood sugars have been slightly elevated, this morning being 206. He states that he will complete an antibiotic tonight. He states this was prescribed by Dr. Jacqualyn Posey. He states that he has refilled this prescription twice, without direction to continue taking this medication. 02/16/17; patient wound about the same as last week per her description. Rolled edges of callus scan and nonviable subcutaneous tissue. Using Prisma. Apparently used topical antibiotic instead of the Prisma over the course of this week. He has not a total contact cast candidate secondary to ambulation needs and unsteadiness on his feet per discussion with our  intake  nurse 03/02/17 patient requires debridement. He has a small wound on the right great toe which probes however does not approach bone. What I can see of the base of this wound looks reasonably healthy and I think this is largely in offloading issue with the second toe crossing over the first. He did not tolerate a total contact cast has balance issues 5/171/8. 0.2x0.2x0.2 less depth. Using prisma 03/16/17; no major change. Still a small probing wound with copious amounts of surrounding callus skin nonviable subcutaneous tissue all of which debrided. Still using Prisma. I suspect the dimensions post debridement are about the same as listed above 0.2 x 0.2 x 0.2 03/23/17; no major change using Prisma. Once again a large amount of callus nonviable subcutaneous tissue around the wound. I went over the offloading issues with the patient and the nurse. He did not tolerate a total contact cast he uses public transportation among other issues. 03/30/17; no major change using Prisma. Once again a large amount of callus and nonviable tissue around the wound requires debridement. Saw Dr. Amalia Hailey who pared calluses on the other foot. 04/06/17; the wound is much smaller there is callus around this although I think I can see the base of the wound and I elected not to do any debridement. 04/13/17 this is a wound I did not debridement last week left callus intact. He arrives today with the area much in the same predicament however this time I remove the callus to find a deep probing hole. This is disappointing this is not go to bone. We have been using Prisma. 04/20/17 plantar left first toe. Again although a lot of circumferential callus around the wound which requires debridement however this does not appear to be a probing area. Culture I did last week show coag-negative staph. I thought this was probably not something that was worth treating but In the interim he was seen by his primary care with parotitis. He is now on  clindamycin and Cipro and the Staphylococcus we cultured is Cipro sensitive [lugdunensis} 04/27/17; plantar left first toe. Again a lot of circumferential callus around a small open area. Removing the callus also nonviable subcutaneous tissue. The wound looks better today. He has completed his antibiotics for the parotitis. I didn't think additional antibiotics were necessary in terms of the wound 05/11/17; plantar first toe. I thought this might be closed this week however once again he has a deep probing small hole. Using pickups and a scalpel debridement of thick callus and subcutaneous tissue from around the wound reveals a depth of about 0.4 cm. We have not been able to offload the area. There is no obvious infection here. No evidence of ischemia 05/18/17; since his last visit he's been in hospital for oral surgery apparently to remove a retained surgical screw. He has swelling of his jaw and has some discomfort. He arrives today with the plantar left first toe in the same predicament. No major change. Each time he comes in here he has thick adherent callus 05/25/17; arrives today with roughly the same condition of the plantar left great toe. Perhaps somewhat better less callus. There are no options for offloading. He is on his feet a lot and I think this is largely a pressure issue X-ray I did last week showed no evidence of osteomyelitis. He has a toe separator as the great toe tends to ride under the second toe when he is walking. I'm hopeful that this will allow for a little less  pressure on the plantar aspect of the left great toe 06/07/2017 -- he is working hard on giving up smoking and other than that has been wearing his diabetic shoes and is also trying to offload as much as possible. 06/15/17; patient arrived in clinic today with a difficult area on his left medial great toe closed over. There is still amount of callus on this however I don't believe there is any underlying open wound. He has  custom shoes with inserts. I still think he is going to need to offload this area going forward. He also follows with Dr. Amalia Hailey of podiatry for routine foot care READMISSION 11/01/16; patient arrives in clinic today with a five-day history of a new area on the previously problematic left great toe. He is wearing his diabetic shoes and he apparently has a new pair coming. The patient is a type II diabetic with neuropathy. He follows with Dr. Amalia Hailey of podiatry for pre-ulcerative callus on his bilateral feet last seen on 08/24/17. He has developed a large blister on top of his left great toe this is since gone down somewhat. He is not been dressing this specifically. He does not have a history of significant PAD previous ABIs in this clinic were1.1 11/06/17; left great toe has epithelialized since last time. I think this was caused by a insoles an issue pushing the toe against the dorsal aspect of the shoe itself has new diabetic shoes coming hopefully will have more forefoot depth. READMISSION 03/01/2022 This is a now 71 year old man with a past medical history significant for type 2 diabetes mellitus with end-stage renal disease on hemodialysis as as well as congestive heart failure, ischemic cardiomyopathy, hypertension, and prior diabetic foot ulcers. He has had multiple issues with his hemodialysis access. He had a prior left radiocephalic fistula that failed. In October 2020, he had a left brachiobasilic fistula. He apparently underwent a procedure at an outside vascular office on November 09, 2021. Subsequent to that, he developed a wound on his left posterior forearm just distal to the elbow as well as wounds on 2 of his left digits. He was diagnosed with steal syndrome and recently underwent ligation of his fistula with placement of a hemodialysis catheter. He is here today for assistance in healing the wounds that developed related to steal syndrome. He says that he has been applying Neosporin to  the sites and they are fairly sensitive to the touch. 03/08/2022: The wounds on his fingers are much smaller and have just a little bit of accumulated thin eschar. The wound on his left posterior forearm is also smaller but has accumulated both slough and eschar. He also has a thick callus on his right fifth metatarsal head plantar surface and unfortunately, it appears that there is an open wound underneath the callus. We have been using Iodosorb with Hydrofera Blue to all of his open wounds. Pain is much improved from last week. 03/15/2022: The wounds on his fingers have healed. The wound on his left posterior forearm is smaller and has a nice layer of granulation tissue with just a bit of accumulated slough. He has built up additional callus around the right fifth metatarsal head plantar surface, but the underlying wound has a good surface with granulation tissue present. We have been using Iodosorb with Hydrofera Blue. He did not endorse any pain this week. 03/22/2022: The wound on his left posterior forearm continues to contract. There is good granulation tissue present and just a little bit of periwound eschar  and overlying slough. The right fifth metatarsal head wound has once again accumulated some callus and there is slough within the wound. The intake nurse measured a area of undermining from 7-10 o'clock. Patient History Information obtained from Patient. Family History Diabetes - Siblings,Father, Heart Disease - Father,Siblings, Hypertension - Father,Siblings, No family history of Cancer, Hereditary Spherocytosis, Kidney Disease, Lung Disease, Seizures, Stroke, Thyroid Problems, Tuberculosis. Social History Current every day smoker - 1/2 ppd / 61yr, Marital Status - Single, Alcohol Use - Moderate - 6 standard drinks per week, Drug Use - Prior History - Polysubstance Abuse; clean for 9 years, Caffeine Use - Rarely - tea. Medical History Eyes Patient has history of Cataracts - mild,  Glaucoma Denies history of Optic Neuritis Ear/Nose/Mouth/Throat Denies history of Chronic sinus problems/congestion, Middle ear problems Hematologic/Lymphatic Patient has history of Anemia Denies history of Hemophilia, Human Immunodeficiency Virus, Lymphedema, Sickle Cell Disease Respiratory Patient has history of Chronic Obstructive Pulmonary Disease (COPD) Denies history of Aspiration, Asthma, Pneumothorax, Sleep Apnea, Tuberculosis Cardiovascular Patient has history of Congestive Heart Failure, Coronary Artery Disease, Hypertension, Myocardial Infarction - 2004 Denies history of Angina, Arrhythmia, Deep Vein Thrombosis, Hypotension, Peripheral Arterial Disease, Peripheral Venous Disease, Phlebitis, Vasculitis Gastrointestinal Patient has history of Hepatitis C - 1968 Denies history of Cirrhosis , Colitis, Crohnoos, Hepatitis A, Hepatitis B Endocrine Patient has history of Type II Diabetes Denies history of Type I Diabetes Genitourinary Denies history of End Stage Renal Disease Immunological Denies history of Lupus Erythematosus, Raynaudoos, Scleroderma Integumentary (Skin) Denies history of History of Burn Musculoskeletal Patient has history of Gout, Osteoarthritis Denies history of Rheumatoid Arthritis, Osteomyelitis Neurologic Patient has history of Neuropathy Denies history of Dementia, Quadriplegia, Paraplegia, Seizure Disorder Oncologic Denies history of Received Chemotherapy, Received Radiation Psychiatric Denies history of Anorexia/bulimia, Confinement Anxiety Hospitalization/Surgery History - Colonoscopy. - CABG. - Laproscopic Cholecystectomy. - Left Hip Fx. - Left Jaw Fx. - Lumbar Laminectomy. Medical A Surgical History Notes nd Constitutional Symptoms (General Health) Insomnia Syncope Alcohol Dependence HIV Hematologic/Lymphatic Hyperlipidemia Hyperglycemia Cardiovascular CABG, ischemic cardiomyopathy Gastrointestinal GERD Genitourinary Syphilis  CKD,Nocturia Immunological HIV Musculoskeletal Hip Fx BiLateral Bunions Unspecified Arthritis Neurologic chronic back pain Objective Constitutional No acute distress. Vitals Time Taken: 12:44 PM, Height: 74 in, Weight: 172 lbs, BMI: 22.1, Temperature: 97.5 F, Pulse: 79 bpm, Respiratory Rate: 18 breaths/min, Blood Pressure: 139/76 mmHg. General Notes: 03/22/2022: The wound on his left posterior forearm continues to contract. There is good granulation tissue present and just a little bit of periwound eschar and overlying slough. The right fifth metatarsal head wound has once again accumulated some callus and there is slough within the wound. The intake nurse measured a area of undermining from 7-10 o'clock. Integumentary (Hair, Skin) Wound #7 status is Open. Original cause of wound was Blister. The date acquired was: 11/09/2021. The wound has been in treatment 3 weeks. The wound is located on the Left,Posterior Upper Arm. The wound measures 0.5cm length x 0.4cm width x 0.1cm depth; 0.157cm^2 area and 0.016cm^3 volume. There is Fat Layer (Subcutaneous Tissue) exposed. There is no tunneling or undermining noted. There is a medium amount of serosanguineous drainage noted. The wound margin is flat and intact. There is large (67-100%) red granulation within the wound bed. There is a small (1-33%) amount of necrotic tissue within the wound bed including Eschar and Adherent Slough. Wound #8 status is Open. Original cause of wound was Blister. The date acquired was: 03/08/2022. The wound has been in treatment 2 weeks. The wound is located on the Right,Plantar  Foot. The wound measures 0.4cm length x 0.7cm width x 0.3cm depth; 0.22cm^2 area and 0.066cm^3 volume. There is Fat Layer (Subcutaneous Tissue) exposed. There is undermining starting at 7:00 and ending at 10:00 with a maximum distance of 0.3cm. There is a medium amount of serosanguineous drainage noted. The wound margin is thickened. There is small  (1-33%) red granulation within the wound bed. There is a large (67-100%) amount of necrotic tissue within the wound bed including Eschar and Adherent Slough. Assessment Active Problems ICD-10 Non-pressure chronic ulcer of skin of other sites with unspecified severity Type 2 diabetes mellitus with diabetic chronic kidney disease Chronic obstructive pulmonary disease, unspecified Chronic combined systolic (congestive) and diastolic (congestive) heart failure Essential (primary) hypertension Secondary hyperparathyroidism of renal origin End stage renal disease Human immunodeficiency virus [HIV] disease Non-pressure chronic ulcer of other part of right foot with fat layer exposed Procedures Wound #7 Pre-procedure diagnosis of Wound #7 is an Abrasion located on the Left,Posterior Upper Arm . There was a Selective/Open Wound Non-Viable Tissue Debridement with a total area of 0.2 sq cm performed by Fredirick Maudlin, MD. With the following instrument(s): Curette to remove Non-Viable tissue/material. Material removed includes Eschar and Slough and after achieving pain control using Other (Benzocaine 20% spray). No specimens were taken. A time out was conducted at 13:15, prior to the start of the procedure. A Minimum amount of bleeding was controlled with Pressure. The procedure was tolerated well with a pain level of 0 throughout and a pain level of 0 following the procedure. Post Debridement Measurements: 0.5cm length x 0.4cm width x 0.1cm depth; 0.016cm^3 volume. Character of Wound/Ulcer Post Debridement is improved. Post procedure Diagnosis Wound #7: Same as Pre-Procedure Wound #8 Pre-procedure diagnosis of Wound #8 is a Diabetic Wound/Ulcer of the Lower Extremity located on the Right,Plantar Foot .Severity of Tissue Pre Debridement is: Fat layer exposed. There was a Selective/Open Wound Non-Viable Tissue Debridement with a total area of 0.28 sq cm performed by Fredirick Maudlin, MD. With the  following instrument(s): Curette to remove Non-Viable tissue/material. Material removed includes Callus and Slough and after achieving pain control using Other (Benzocaine 20% spray). No specimens were taken. A time out was conducted at 13:15, prior to the start of the procedure. A Minimum amount of bleeding was controlled with Pressure. The procedure was tolerated well with a pain level of 0 throughout and a pain level of 0 following the procedure. Post Debridement Measurements: 0.4cm length x 0.7cm width x 0.3cm depth; 0.066cm^3 volume. Character of Wound/Ulcer Post Debridement is improved. Severity of Tissue Post Debridement is: Fat layer exposed. Post procedure Diagnosis Wound #8: Same as Pre-Procedure Plan Follow-up Appointments: Return Appointment in 1 week. - Dr Celine Ahr Room 3 Tuesday 03/29/22 at 11 am Bathing/ Shower/ Hygiene: Other Bathing/Shower/Hygiene Orders/Instructions: - Change dressing after bathing WOUND #7: - Upper Arm Wound Laterality: Left, Posterior Cleanser: Soap and Water Every Other Day/30 Days Discharge Instructions: May shower and wash wound with dial antibacterial soap and water prior to dressing change. Cleanser: Wound Cleanser (Generic) Every Other Day/30 Days Discharge Instructions: Cleanse the wound with wound cleanser prior to applying a clean dressing using gauze sponges, not tissue or cotton balls. Prim Dressing: Hydrofera Blue Ready Foam, 2.5 x2.5 in (Generic) Every Other Day/30 Days ary Discharge Instructions: Apply to wound bed as instructed Secondary Dressing: Bordered Gauze, 4x4 in (Generic) Every Other Day/30 Days Discharge Instructions: Apply over primary dressing as directed. WOUND #8: - Foot Wound Laterality: Plantar, Right Cleanser: Soap and Water Every  Other Day/30 Days Discharge Instructions: May shower and wash wound with dial antibacterial soap and water prior to dressing change. Cleanser: Wound Cleanser (Generic) Every Other Day/30 Days Discharge  Instructions: Cleanse the wound with wound cleanser prior to applying a clean dressing using gauze sponges, not tissue or cotton balls. Prim Dressing: Hydrofera Blue Ready Foam, 2.5 x2.5 in (Generic) Every Other Day/30 Days ary Discharge Instructions: Apply to wound bed as instructed Secondary Dressing: Optifoam Non-Adhesive Dressing, 4x4 in (Generic) Every Other Day/30 Days Discharge Instructions: Apply over primary dressing as directed. Secondary Dressing: Woven Gauze Sponges 2x2 in (Generic) Every Other Day/30 Days Discharge Instructions: Apply over primary dressing as directed. Secured With: Child psychotherapist, Sterile 2x75 (in/in) (Generic) Every Other Day/30 Days Discharge Instructions: Secure with stretch gauze as directed. Secured With: 60M Medipore Public affairs consultant Surgical T 2x10 (in/yd) (Generic) Every Other Day/30 Days ape Discharge Instructions: Secure with tape as directed. 03/22/2022: The wound on his left posterior forearm continues to contract. There is good granulation tissue present and just a little bit of periwound eschar and overlying slough. The right fifth metatarsal head wound has once again accumulated some callus and there is slough within the wound. The intake nurse measured a area of undermining from 7-10 o'clock. I used a curette to debride the slough and eschar from the arm wound as well as callus and slough from the foot wound. I tried to take down the area of undermining and saucerized the wound as I did this. We will continue using the Hydrofera Blue to both wounds. Follow-up in 1 week. Electronic Signature(s) Signed: 03/22/2022 1:47:13 PM By: Fredirick Maudlin MD FACS Entered By: Fredirick Maudlin on 03/22/2022 13:47:13 -------------------------------------------------------------------------------- HxROS Details Patient Name: Date of Service: Jacob [name]. 03/22/2022 12:30 PM Medical Record Number: [id number] Patient Account Number: [id number] Date  of Birth/Sex: Treating RN: 01-27-1951 (71 y.o. [name] Primary Care Provider: Alain Honey Other Clinician: Referring Provider: Treating Provider/Extender: Neil Crouch in Treatment: 3 Information Obtained From Patient Constitutional Symptoms (General Health) Medical History: Past Medical History Notes: Insomnia Syncope Alcohol Dependence HIV Eyes Medical History: Positive for: Cataracts - mild; Glaucoma Negative for: Optic Neuritis Ear/Nose/Mouth/Throat Medical History: Negative for: Chronic sinus problems/congestion; Middle ear problems Hematologic/Lymphatic Medical History: Positive for: Anemia Negative for: Hemophilia; Human Immunodeficiency Virus; Lymphedema; Sickle Cell Disease Past Medical History Notes: Hyperlipidemia Hyperglycemia Respiratory Medical History: Positive for: Chronic Obstructive Pulmonary Disease (COPD) Negative for: Aspiration; Asthma; Pneumothorax; Sleep Apnea; Tuberculosis Cardiovascular Medical History: Positive for: Congestive Heart Failure; Coronary Artery Disease; Hypertension; Myocardial Infarction - 2004 Negative for: Angina; Arrhythmia; Deep Vein Thrombosis; Hypotension; Peripheral Arterial Disease; Peripheral Venous Disease; Phlebitis; Vasculitis Past Medical History Notes: CABG, ischemic cardiomyopathy Gastrointestinal Medical History: Positive for: Hepatitis C - 1968 Negative for: Cirrhosis ; Colitis; Crohns; Hepatitis A; Hepatitis B Past Medical History Notes: GERD Endocrine Medical History: Positive for: Type II Diabetes Negative for: Type I Diabetes Time with diabetes: 13 yrs Treated with: Insulin Blood sugar tested every day: Yes Tested : 2 times per day Blood sugar testing results: Breakfast: 100; Dinner: 95-126 Genitourinary Medical History: Negative for: End Stage Renal Disease Past Medical History Notes: Syphilis CKD,Nocturia Immunological Medical History: Negative for:  Lupus Erythematosus; Raynauds; Scleroderma Past Medical History Notes: HIV Integumentary (Skin) Medical History: Negative for: History of Burn Musculoskeletal Medical History: Positive for: Gout; Osteoarthritis Negative for: Rheumatoid Arthritis; Osteomyelitis Past Medical History Notes: Hip Fx BiLateral Bunions Unspecified Arthritis Neurologic Medical History: Positive for: Neuropathy  Negative for: Dementia; Quadriplegia; Paraplegia; Seizure Disorder Past Medical History Notes: chronic back pain Oncologic Medical History: Negative for: Received Chemotherapy; Received Radiation Psychiatric Medical History: Negative for: Anorexia/bulimia; Confinement Anxiety HBO Extended History Items Eyes: Eyes: Cataracts Glaucoma Immunizations Pneumococcal Vaccine: Received Pneumococcal Vaccination: Yes Received Pneumococcal Vaccination On or After 60th Birthday: Yes Implantable Devices No devices added Hospitalization / Surgery History Type of Hospitalization/Surgery Colonoscopy CABG Laproscopic Cholecystectomy Left Hip Fx Left Jaw Fx Lumbar Laminectomy Family and Social History Cancer: No; Diabetes: Yes - Siblings,Father; Heart Disease: Yes - Father,Siblings; Hereditary Spherocytosis: No; Hypertension: Yes - Father,Siblings; Kidney Disease: No; Lung Disease: No; Seizures: No; Stroke: No; Thyroid Problems: No; Tuberculosis: No; Current every day smoker - 1/2 ppd / 62yr; Marital Status - Single; Alcohol Use: Moderate - 6 standard drinks per week; Drug Use: Prior History - Polysubstance Abuse; clean for 9 years; Caffeine Use: Rarely - tea; Financial Concerns: No; Food, Clothing or Shelter Needs: No; Support System Lacking: No; Transportation Concerns: No EEngineer, maintenance Signed: 03/22/2022 3:01:24 PM By: CFredirick MaudlinMD FACS Signed: 03/22/2022 4:54:57 PM By: SDellie CatholicRN Entered By: CFredirick Maudlinon 03/22/2022  13:45:49 -------------------------------------------------------------------------------- SuperBill Details Patient Name: Date of Service: NRaechel Ache5/30/2023 Medical Record Number: [id number] Account Number: [id number] of Birth/Sex: Treating RN: 411-26-1952(71y.o. MCollene GobblePrimary Care Provider: MAlain HoneyOther Clinician: Referring Provider: Treating Provider/Extender: CNeil Crouchin Treatment: 3 Diagnosis Coding ICD-10 Codes Code Description [phone]-pressure chronic ulcer of skin of other sites with unspecified severity E11.22 Type 2 diabetes mellitus with diabetic chronic kidney disease J44.9 Chronic obstructive pulmonary disease, unspecified I50.42 Chronic combined systolic (congestive) and diastolic (congestive) heart failure I10 Essential (primary) hypertension N25.81 Secondary hyperparathyroidism of renal origin N18.6 End stage renal disease B20 Human immunodeficiency virus [HIV] disease L97.512 Non-pressure chronic ulcer of other part of right foot with fat layer exposed Facility Procedures CPT4 Code: [id number]: [id number]- DEBRIDE WOUND 1ST 20 SQ CM OR < ICD-10 Diagnosis Description L98.499 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 Modifier: Quantity: 1 Physician Procedures : CPT4 Code Description Modifier [id number]- WC PHYS LEVEL 3 - EST PT 25 ICD-10 Diagnosis Description L98.499 Non-pressure chronic ulcer of skin of other sites with unspecified severity L97.512 Non-pressure chronic ulcer of other part of right foot  with fat layer exposed 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

## 2022-03-22 NOTE — Progress Notes (Signed)
NYLE, LIMB ([id number]) Visit Report for 03/22/2022 Arrival Information Details Patient Name: Date of Service: STEPEN, PRINS 03/22/2022 12:30 PM Medical Record Number: [id number] Patient Account Number: [id number] Date of Birth/Sex: Treating RN: [birth year] (71 y.o. [name] Primary Care Saamir Armstrong: Alain Honey Other Clinician: Referring Aydyn Testerman: Treating Terran Klinke/Extender: Neil Crouch in Treatment: 3 Visit Information History Since Last Visit Added or deleted any medications: No Patient Arrived: [name] Any new allergies or adverse reactions: No Arrival Time: 12:43 Had a fall or experienced change in No Accompanied By: self activities of daily living that may affect Transfer Assistance: None risk of falls: Patient Identification Verified: Yes Signs or symptoms of abuse/neglect since last visito No Patient Requires Transmission-Based Precautions: No Hospitalized since last visit: No Patient Has Alerts: No Implantable device outside of the clinic excluding No cellular tissue based products placed in the center since last visit: Has Dressing in Place as Prescribed: Yes Pain Present Now: Yes Electronic Signature(s) Signed: 03/22/2022 4:54:57 PM By: Dellie Catholic RN Entered By: Dellie Catholic on 03/22/2022 12:44:28 -------------------------------------------------------------------------------- Encounter Discharge Information Details Patient Name: Date of Service: [name] 03/22/2022 12:30 PM Medical Record Number: [id number] Patient Account Number: [id number] Date of Birth/Sex: Treating RN: 1950/12/30 (71 y.o. [name] Primary Care Tyrina Hines: Alain Honey Other Clinician: Referring Smantha Boakye: Treating Palak Tercero/Extender: Neil Crouch in Treatment: 3 Encounter Discharge Information Items Post Procedure Vitals Discharge Condition: Stable Temperature (F): 97.5 Ambulatory Status:  Cane Pulse (bpm): 79 Discharge Destination: Home Respiratory Rate (breaths/min): 18 Transportation: Other Blood Pressure (mmHg): 139/76 Accompanied By: self Schedule Follow-up Appointment: Yes Clinical Summary of Care: Patient Declined Electronic Signature(s) Signed: 03/22/2022 4:54:57 PM By: Dellie Catholic RN Entered By: Dellie Catholic on 03/22/2022 16:51:01 -------------------------------------------------------------------------------- Lower Extremity Assessment Details Patient Name: Date of Service: YUKI, PURVES 03/22/2022 12:30 PM Medical Record Number: [id number] Patient Account Number: [id number] Date of Birth/Sex: Treating RN: 10/03/1951 (71 y.o. [name] Primary Care Kameryn Davern: Alain Honey Other Clinician: Referring Eryca Bolte: Treating Aspen Lawrance/Extender: Neil Crouch in Treatment: 3 Edema Assessment Assessed: [name]: No] [Right: No] E[Left: dema] [Right: :] Calf Left: Right: Point of Measurement: From Medial Instep 32.3 cm Ankle Left: Right: Point of Measurement: From Medial Instep 21 cm Electronic Signature(s) Signed: 03/22/2022 4:54:57 PM By: Dellie Catholic RN Entered By: Dellie Catholic on 03/22/2022 12:48:04 -------------------------------------------------------------------------------- Multi Wound Chart Details Patient Name: Date of Service: [name] 03/22/2022 12:30 PM Medical Record Number: [id number] Patient Account Number: [id number] Date of Birth/Sex: Treating RN: 06/30/1951 (71 y.o. [name] Primary Care Xzandria Clevinger: Alain Honey Other Clinician: Referring Maecy Podgurski: Treating Eyden Dobie/Extender: Neil Crouch in Treatment: 3 Vital Signs Height(in): 51 Pulse(bpm): 77 Weight(lbs): 172 Blood Pressure(mmHg): 139/76 Body Mass Index(BMI): 22.1 Temperature(F): 97.5 Respiratory Rate(breaths/min): 18 Photos: [N/A:N/A] Left, Posterior Upper Arm Right, Plantar  Foot N/A Wound Location: Blister Blister N/A Wounding Event: Abrasion Diabetic Wound/Ulcer of the Lower N/A Primary Etiology: Extremity Cataracts, Glaucoma, Anemia, Chronic Cataracts, Glaucoma, Anemia, Chronic N/A Comorbid History: Obstructive Pulmonary Disease Obstructive Pulmonary Disease (COPD), Congestive Heart Failure, (COPD), Congestive Heart Failure, Coronary Artery Disease, Coronary Artery Disease, Hypertension, Myocardial Infarction, Hypertension, Myocardial Infarction, Hepatitis C, Type II Diabetes, Gout, Hepatitis C, Type II Diabetes, Gout, Osteoarthritis, Neuropathy Osteoarthritis, Neuropathy 11/09/2021 03/08/2022 N/A Date Acquired: 3 2 N/A Weeks of Treatment: Open Open N/A Wound Status: No No N/A Wound Recurrence: 0.5x0.4x0.1 0.4x0.7x0.3 N/A Measurements L x W x  D (cm) 0.157 0.22 N/A A (cm) : rea 0.016 0.066 N/A Volume (cm) : 95.50% 80.00% N/A % Reduction in A rea: 95.40% 80.00% N/A % Reduction in Volume: 7 Starting Position 1 (o'clock): 10 Ending Position 1 (o'clock): 0.3 Maximum Distance 1 (cm): No Yes N/A Undermining: Full Thickness Without Exposed Grade 2 N/A Classification: Support Structures Medium Medium N/A Exudate A mount: Serosanguineous Serosanguineous N/A Exudate Type: red, brown red, brown N/A Exudate Color: Flat and Intact Thickened N/A Wound Margin: Large (67-100%) Small (1-33%) N/A Granulation A mount: Red Red N/A Granulation Quality: Small (1-33%) Large (67-100%) N/A Necrotic A mount: Eschar, Adherent Slough Eschar, Adherent Slough N/A Necrotic Tissue: Fat Layer (Subcutaneous Tissue): Yes Fat Layer (Subcutaneous Tissue): Yes N/A Exposed Structures: Fascia: No Fascia: No Tendon: No Tendon: No Muscle: No Muscle: No Joint: No Joint: No Bone: No Bone: No Medium (34-66%) None N/A Epithelialization: Debridement - Selective/Open Wound Debridement - Selective/Open Wound N/A Debridement: Pre-procedure Verification/Time  Out 13:15 13:15 N/A Taken: Other Other N/A Pain Control: Necrotic/Eschar, Slough Callus, Slough N/A Tissue Debrided: Non-Viable Tissue Non-Viable Tissue N/A Level: 0.2 0.28 N/A Debridement A (sq cm): rea Curette Curette N/A Instrument: Minimum Minimum N/A Bleeding: Pressure Pressure N/A Hemostasis A chieved: 0 0 N/A Procedural Pain: 0 0 N/A Post Procedural Pain: Procedure was tolerated well Procedure was tolerated well N/A Debridement Treatment Response: 0.5x0.4x0.1 0.4x0.7x0.3 N/A Post Debridement Measurements L x W x D (cm) 0.016 0.066 N/A Post Debridement Volume: (cm) Debridement Debridement N/A Procedures Performed: Treatment Notes Electronic Signature(s) Signed: 03/22/2022 1:44:48 PM By: Fredirick Maudlin MD FACS Signed: 03/22/2022 4:54:57 PM By: Dellie Catholic RN Entered By: Fredirick Maudlin on 03/22/2022 13:44:47 -------------------------------------------------------------------------------- Multi-Disciplinary Care Plan Details Patient Name: Date of Service: [name] 03/22/2022 12:30 PM Medical Record Number: [id number] Patient Account Number: [id number] Date of Birth/Sex: Treating RN: Aug 04, 1951 (71 y.o. [name] Primary Care Novie Maggio: Alain Honey Other Clinician: Referring Kaelem Brach: Treating Marshal Eskew/Extender: Neil Crouch in Treatment: 3 Active Inactive Wound/Skin Impairment Nursing Diagnoses: Impaired tissue integrity Goals: Patient/caregiver will verbalize understanding of skin care regimen Date Initiated: 03/01/2022 Target Resolution Date: 04/22/2022 Goal Status: Active Interventions: Assess ulceration(s) every visit Treatment Activities: Skin care regimen initiated : 03/01/2022 Notes: Electronic Signature(s) Signed: 03/22/2022 4:54:57 PM By: Dellie Catholic RN Entered By: Dellie Catholic on 03/22/2022 16:49:14 -------------------------------------------------------------------------------- Pain  Assessment Details Patient Name: Date of Service: [name] 03/22/2022 12:30 PM Medical Record Number: [id number] Patient Account Number: [id number] Date of Birth/Sex: Treating RN: [birth year] (71 y.o. [name] Primary Care Lynze Reddy: Alain Honey Other Clinician: Referring Scotlynn Noyes: Treating Jeanita Carneiro/Extender: Neil Crouch in Treatment: 3 Active Problems Location of Pain Severity and Description of Pain Patient Has Paino Yes Site Locations Pain Location: Generalized Pain With Dressing Change: No Duration of the Pain. Constant / Intermittento Constant Rate the pain. Current Pain Level: 8 Worst Pain Level: 10 Least Pain Level: 7 Tolerable Pain Level: 7 Character of Pain Describe the Pain: Difficult to Pinpoint Pain Management and Medication Current Pain Management: Medication: Yes Cold Application: No Rest: Yes Massage: No Activity: No T.E.N.S.: No Heat Application: No Leg drop or elevation: No Is the Current Pain Management Adequate: Adequate How does your wound impact your activities of daily livingo Sleep: No Bathing: No Appetite: No Relationship With Others: No Bladder Continence: No Emotions: No Bowel Continence: No Work: No Toileting: No Drive: No Dressing: No Hobbies: No Electronic Signature(s) Signed: 03/22/2022 4:54:57 PM By: Dellie Catholic  RN Entered By: Dellie Catholic on 03/22/2022 12:47:18 -------------------------------------------------------------------------------- Patient/Caregiver Education Details Patient Name: Date of Service: CLARK, CUFF 5/30/2023andnbsp12:30 PM Medical Record Number: [id number] Patient Account Number: [id number] Date of Birth/Gender: Treating RN: [birth year] (72 y.o. [name] Primary Care Physician: Alain Honey Other Clinician: Referring Physician: Treating Physician/Extender: Neil Crouch in Treatment: 3 Education  Assessment Education Provided To: Patient Education Topics Provided Wound/Skin Impairment: Methods: Explain/Verbal Responses: Return demonstration correctly Electronic Signature(s) Signed: 03/22/2022 4:54:57 PM By: Dellie Catholic RN Entered By: Dellie Catholic on 03/22/2022 16:49:30 -------------------------------------------------------------------------------- Wound Assessment Details Patient Name: Date of Service: [name] 03/22/2022 12:30 PM Medical Record Number: [id number] Patient Account Number: [id number] Date of Birth/Sex: Treating RN: [birth year] (71 y.o. [name] Primary Care Khush Pasion: Alain Honey Other Clinician: Referring Georgetta Crafton: Treating Micky Overturf/Extender: Neil Crouch in Treatment: 3 Wound Status Wound Number: 7 Primary Abrasion Etiology: Wound Location: Left, Posterior Upper Arm Wound Open Wounding Event: Blister Status: Date Acquired: 11/09/2021 Comorbid Cataracts, Glaucoma, Anemia, Chronic Obstructive Pulmonary Weeks Of Treatment: 3 History: Disease (COPD), Congestive Heart Failure, Coronary Artery Clustered Wound: No Disease, Hypertension, Myocardial Infarction, Hepatitis C, Type II Diabetes, Gout, Osteoarthritis, Neuropathy Photos Wound Measurements Length: (cm) 0.5 Width: (cm) 0.4 Depth: (cm) 0.1 Area: (cm) 0.157 Volume: (cm) 0.016 % Reduction in Area: 95.5% % Reduction in Volume: 95.4% Epithelialization: Medium (34-66%) Tunneling: No Undermining: No Wound Description Classification: Full Thickness Without Exposed Support Structures Wound Margin: Flat and Intact Exudate Amount: Medium Exudate Type: Serosanguineous Exudate Color: red, brown Foul Odor After Cleansing: No Slough/Fibrino Yes Wound Bed Granulation Amount: Large (67-100%) Exposed Structure Granulation Quality: Red Fascia Exposed: No Necrotic Amount: Small (1-33%) Fat Layer (Subcutaneous Tissue) Exposed: Yes Necrotic  Quality: Eschar, Adherent Slough Tendon Exposed: No Muscle Exposed: No Joint Exposed: No Bone Exposed: No Treatment Notes Wound #7 (Upper Arm) Wound Laterality: Left, Posterior Cleanser Soap and Water Discharge Instruction: May shower and wash wound with dial antibacterial soap and water prior to dressing change. Wound Cleanser Discharge Instruction: Cleanse the wound with wound cleanser prior to applying a clean dressing using gauze sponges, not tissue or cotton balls. Peri-Wound Care Topical Primary Dressing Hydrofera Blue Ready Foam, 2.5 x2.5 in Discharge Instruction: Apply to wound bed as instructed Secondary Dressing Bordered Gauze, 4x4 in Discharge Instruction: Apply over primary dressing as directed. Secured With Compression Wrap Compression Stockings Environmental education officer) Signed: 03/22/2022 4:54:57 PM By: Dellie Catholic RN Entered By: Dellie Catholic on 03/22/2022 13:02:34 -------------------------------------------------------------------------------- Wound Assessment Details Patient Name: Date of Service: [name] 03/22/2022 12:30 PM Medical Record Number: [id number] Patient Account Number: [id number] Date of Birth/Sex: Treating RN: May 20, 1951 (71 y.o. [name] Primary Care Lacinda Curvin: Alain Honey Other Clinician: Referring Jenae Tomasello: Treating Dymin Dingledine/Extender: Neil Crouch in Treatment: 3 Wound Status Wound Number: 8 Primary Diabetic Wound/Ulcer of the Lower Extremity Etiology: Wound Location: Right, Plantar Foot Wound Open Wounding Event: Blister Status: Date Acquired: 03/08/2022 Comorbid Cataracts, Glaucoma, Anemia, Chronic Obstructive Pulmonary Weeks Of Treatment: 2 History: Disease (COPD), Congestive Heart Failure, Coronary Artery Clustered Wound: No Disease, Hypertension, Myocardial Infarction, Hepatitis C, Type II Diabetes, Gout, Osteoarthritis, Neuropathy Photos Wound Measurements Length:  (cm) 0.4 Width: (cm) 0.7 Depth: (cm) 0.3 Area: (cm) 0.22 Volume: (cm) 0.066 % Reduction in Area: 80% % Reduction in Volume: 80% Epithelialization: None Undermining: Yes Starting Position (o'clock): 7 Ending Position (o'clock): 10 Maximum Distance: (cm) 0.3 Wound Description Classification: Grade 2 Wound  Margin: Thickened Exudate Amount: Medium Exudate Type: Serosanguineous Exudate Color: red, brown Foul Odor After Cleansing: No Slough/Fibrino Yes Wound Bed Granulation Amount: Small (1-33%) Exposed Structure Granulation Quality: Red Fascia Exposed: No Necrotic Amount: Large (67-100%) Fat Layer (Subcutaneous Tissue) Exposed: Yes Necrotic Quality: Eschar, Adherent Slough Tendon Exposed: No Muscle Exposed: No Joint Exposed: No Bone Exposed: No Treatment Notes Wound #8 (Foot) Wound Laterality: Plantar, Right Cleanser Soap and Water Discharge Instruction: May shower and wash wound with dial antibacterial soap and water prior to dressing change. Wound Cleanser Discharge Instruction: Cleanse the wound with wound cleanser prior to applying a clean dressing using gauze sponges, not tissue or cotton balls. Peri-Wound Care Topical Primary Dressing Hydrofera Blue Ready Foam, 2.5 x2.5 in Discharge Instruction: Apply to wound bed as instructed Secondary Dressing Optifoam Non-Adhesive Dressing, 4x4 in Discharge Instruction: Apply over primary dressing as directed. Woven Gauze Sponges 2x2 in Discharge Instruction: Apply over primary dressing as directed. Secured With Conforming Stretch Gauze Bandage, Sterile 2x75 (in/in) Discharge Instruction: Secure with stretch gauze as directed. 39M Medipore Soft Cloth Surgical T 2x10 (in/yd) ape Discharge Instruction: Secure with tape as directed. Compression Wrap Compression Stockings Add-Ons Electronic Signature(s) Signed: 03/22/2022 4:54:57 PM By: Dellie Catholic RN Entered By: Dellie Catholic on 03/22/2022  13:03:11 -------------------------------------------------------------------------------- Vitals Details Patient Name: Date of Service: [name] 03/22/2022 12:30 PM Medical Record Number: [id number] Patient Account Number: [id number] Date of Birth/Sex: Treating RN: 09/20/1951 (71 y.o. [name] Primary Care Dossie Swor: Alain Honey Other Clinician: Referring Taraann Olthoff: Treating Kyndal Heringer/Extender: Neil Crouch in Treatment: 3 Vital Signs Time Taken: 12:44 Temperature (F): 97.5 Height (in): 74 Pulse (bpm): 79 Weight (lbs): 172 Respiratory Rate (breaths/min): 18 Body Mass Index (BMI): 22.1 Blood Pressure (mmHg): 139/76 Reference Range: 80 - 120 mg / dl Electronic Signature(s) Signed: 03/22/2022 4:54:57 PM By: Dellie Catholic RN Entered By: Dellie Catholic on 03/22/2022 12:46:24

## 2022-03-22 NOTE — Progress Notes (Signed)
Provider:  Alain Honey, MD  Careteam: Patient Care Team: [name], MD as PCP - General (Family Medicine) [name], MD as PCP - Infectious Diseases (Infectious Diseases) [name], MD as PCP - Cardiology (Cardiology) [name], MD as Consulting Physician (Ophthalmology) [name], MD as Consulting Physician (Cardiology) [name], MD as Consulting Physician (Infectious Diseases) Gardiner Barefoot, DPM as Consulting Physician (Podiatry) [name], Nelda Bucks, NP as Nurse Practitioner (Family Medicine) Kidney, [facility]  PLACE OF SERVICE:  [place] Directive information Does Patient Have a Medical Advance Directive?: No, Would patient like information on creating a medical advance directive?: No - Patient declined  Allergies  Allergen Reactions   Augmentin [Amoxicillin-Pot Clavulanate] Diarrhea and Other (See Comments)    Severe diarrhea   Mucinex Fast-Max Other (See Comments)    Intense sweating    Amphetamines Other (See Comments)    Unknown reaction    Chief Complaint  Patient presents with   Medical Management of Chronic Issues    Patient is here for a 87M F/U needs RX on Oxycodone for left arm pain     HPI: Patient is a 71 y.o. male patient with end-stage renal disease on dialysis 3 times a week, type 2 diabetes, HIV,.  Has had recent issues with AV shunt.  Now has a central line through which dialysis is done. Continues with long and short acting insulin.  Most recent A1c is 6.94 months ago. Continues to take oxycodone for various pains including back and legs but also related to dialysis.  He seems compliant and drug screens have confirmed.  Review of Systems:  Review of Systems  Constitutional: Negative.   HENT: Negative.    Cardiovascular: Negative.   Genitourinary: Negative.   Musculoskeletal:  Positive for back pain.  Skin: Negative.   All other systems reviewed and are  negative.  Past Medical History:  Diagnosis Date   Acute respiratory failure ([place]) 03/01/2018   Anemia    Arthritis    "all over; mostly knees and back" (02/28/2018)   Chronic combined systolic and diastolic CHF (congestive heart failure) (HCC)    Chronic lower back pain    stenosis   Community acquired pneumonia 09/06/2013   COPD (chronic obstructive pulmonary disease) ([place])    Coronary atherosclerosis of native coronary artery    a. 02/2003 s/p CABG x 2 (VG->RI, VG->RPDA; b. 11/2019 PCI: LM nl, LAD 90d, D3 50, RI 100, LCX 100p, OM3 100 - fills via L->L collats from D2/dLAD, RCA 100p, VG->RPDA ok, VG->RI 95 (3.5x48 Synergy XD DES).   Drug abuse ([place])    hx; tested for cocaine as recently as 2/08. says he is not using drugs now - avoided defib. for this reason    ESRD (end stage renal disease) ([place])    [place] M-W-F- [facility]   Fall at home 10/2020   GERD (gastroesophageal reflux disease)    takes OTC meds as needed   GI bleeding    a. 11/2019 EGD: angiodysplastic lesions w/ bleeding s/p argon plasma/clipping/epi inj. Multiple admissions for the same.   Glaucoma    uses eye drops daily   Hepatitis B 1968   "tx'd w/isolation; caught it from toilet stools in gym"   History of blood transfusion 03/01/2019   History of colon polyps    benign   History of gout    takes Allopurinol daily as well as Colchicine-if needed (02/28/2018)   History of kidney stones  HTN (hypertension)    takes Coreg,Imdur.and Apresoline daily   Human immunodeficiency virus (HIV) disease ([place]) dx'd 1995   on [place] as of 12/2020.     Hyperlipidemia    Ischemic cardiomyopathy    a. 01/2019 Echo: EF 40-45%, diffuse HK, mild basal septal hypertrophy. Diast dysfxn. Nl RV size/fxn. Sev dil LA. Triv MR/TR/PR.   Muscle spasm    takes Zanaflex as needed   Myocardial infarction ([place]) ~ 2004/2005   Nocturia    Peripheral neuropathy    takes gabapentin daily   Pneumonia    "at least twice" (02/28/2018)   SDH (subdural  hematoma) (HCC)    Syphilis, unspecified    Type II diabetes mellitus ([place]) 2004   Lantus daily.Average fasting blood sugar 125-199   Wears glasses    Wears partial dentures    Past Surgical History:  Procedure Laterality Date   AV FISTULA PLACEMENT Left 08/02/2018   Procedure: ARTERIOVENOUS (AV) FISTULA CREATION  left arm radiocephlic;  Surgeon: Marty Heck, MD;  Location: [place];  Service: Vascular;  Laterality: Left;   AV FISTULA PLACEMENT Left 08/01/2019   Procedure: LEFT BRACHIOCEPHALIC ARTERIOVENOUS (AV) FISTULA CREATION;  Surgeon: [name], MD;  Location: [place];  Service: Vascular;  Laterality: Left;   [place] Left 10/03/2019   Procedure: BASILIC VEIN TRANSPOSITION LEFT SECOND STAGE;  Surgeon: [name], MD;  Location: [place];  Service: Vascular;  Laterality: Left;   BIOPSY  01/25/2021   Procedure: BIOPSY;  Surgeon: Doran Stabler, MD;  Location: [facility] ENDOSCOPY;  Service: Gastroenterology;;   CARDIAC CATHETERIZATION  10/2002; 12/19/2004   [name] 03/08/2011   COLONOSCOPY  2013   [place]    CORONARY ARTERY BYPASS GRAFT  02/24/2003   CABG X2/notes 03/08/2011   CORONARY STENT INTERVENTION N/A 12/19/2019   Procedure: CORONARY STENT INTERVENTION;  Surgeon: Jettie Booze, MD;  Location: [place] CV LAB;  Service: Cardiovascular;  Laterality: N/A;   ENTEROSCOPY N/A 01/25/2021   Procedure: ENTEROSCOPY;  Surgeon: Doran Stabler, MD;  Location: [place];  Service: Gastroenterology;  Laterality: N/A;   ENTEROSCOPY N/A 02/13/2021   Procedure: ENTEROSCOPY;  Surgeon: Jackquline Denmark, MD;  Location[facility];  Service: Endoscopy;  Laterality: N/A;   ENTEROSCOPY N/A 05/07/2021   Procedure: ENTEROSCOPY;  Surgeon: Yetta Flock, MD;  Location: [facility] ENDOSCOPY;  Service: Gastroenterology;  Laterality: N/A;   ESOPHAGOGASTRODUODENOSCOPY (EGD) WITH PROPOFOL N/A 02/08/2019   Procedure: ESOPHAGOGASTRODUODENOSCOPY (EGD) WITH PROPOFOL;  Surgeon:  Milus Banister, MD;  Location: [place];  Service: Gastroenterology;  Laterality: N/A;   ESOPHAGOGASTRODUODENOSCOPY (EGD) WITH PROPOFOL N/A 12/22/2019   Procedure: ESOPHAGOGASTRODUODENOSCOPY (EGD) WITH PROPOFOL;  Surgeon: Lavena Bullion, DO;  Location: [place];  Service: Gastroenterology;  Laterality: N/A;   ESOPHAGOGASTRODUODENOSCOPY (EGD) WITH PROPOFOL N/A 10/19/2020   Procedure: ESOPHAGOGASTRODUODENOSCOPY (EGD) WITH PROPOFOL;  Surgeon: Jackquline Denmark, MD;  Location: [facility] ENDOSCOPY;  Service: Endoscopy;  Laterality: N/A;   ESOPHAGOGASTRODUODENOSCOPY (EGD) WITH PROPOFOL N/A 12/22/2020   Procedure: ESOPHAGOGASTRODUODENOSCOPY (EGD) WITH PROPOFOL;  Surgeon: Gatha Mayer, MD;  Location: [place];  Service: Endoscopy;  Laterality: N/A;   ESOPHAGOGASTRODUODENOSCOPY (EGD) WITH PROPOFOL N/A 01/09/2021   Procedure: ESOPHAGOGASTRODUODENOSCOPY (EGD) WITH PROPOFOL;  Surgeon: Irene Shipper, MD;  Location[facility] ENDOSCOPY;  Service: Endoscopy;  Laterality: N/A;   HEMOSTASIS CLIP PLACEMENT  12/22/2019   Procedure: HEMOSTASIS CLIP PLACEMENT;  Surgeon: Lavena Bullion, DO;  Location: [place];  Service: Gastroenterology;;   HEMOSTASIS CLIP PLACEMENT  12/22/2020  Procedure: HEMOSTASIS CLIP PLACEMENT;  Surgeon: Gatha Mayer, MD;  Location: [facility] ENDOSCOPY;  Service: Endoscopy;;   HEMOSTASIS CONTROL  12/22/2020   Procedure: HEMOSTASIS CONTROL/hemospray;  Surgeon: Gatha Mayer, MD;  Location: [place];  Service: Endoscopy;;   HOT HEMOSTASIS N/A 02/08/2019   Procedure: HOT HEMOSTASIS (ARGON PLASMA COAGULATION/BICAP);  Surgeon: Milus Banister, MD;  Location: [facility] ENDOSCOPY;  Service: Gastroenterology;  Laterality: N/A;   HOT HEMOSTASIS N/A 12/22/2019   Procedure: HOT HEMOSTASIS (ARGON PLASMA COAGULATION/BICAP);  Surgeon: Lavena Bullion, DO;  Location: [facility] ENDOSCOPY;  Service: Gastroenterology;  Laterality: N/A;   HOT HEMOSTASIS N/A 10/19/2020   Procedure: HOT HEMOSTASIS (ARGON PLASMA  COAGULATION/BICAP);  Surgeon: Jackquline Denmark, MD;  Location: [facility] ENDOSCOPY;  Service: Endoscopy;  Laterality: N/A;   HOT HEMOSTASIS N/A 12/22/2020   Procedure: HOT HEMOSTASIS (ARGON PLASMA COAGULATION/BICAP);  Surgeon: Gatha Mayer, MD;  Location: [facility] ENDOSCOPY;  Service: Endoscopy;  Laterality: N/A;   HOT HEMOSTASIS N/A 01/09/2021   Procedure: HOT HEMOSTASIS (ARGON PLASMA COAGULATION/BICAP);  Surgeon: Irene Shipper, MD;  Location: [facility] ENDOSCOPY;  Service: Endoscopy;  Laterality: N/A;   HOT HEMOSTASIS N/A 01/25/2021   Procedure: HOT HEMOSTASIS (ARGON PLASMA COAGULATION/BICAP);  Surgeon: Doran Stabler, MD;  Location: [place];  Service: Gastroenterology;  Laterality: N/A;   HOT HEMOSTASIS N/A 02/13/2021   Procedure: HOT HEMOSTASIS (ARGON PLASMA COAGULATION/BICAP);  Surgeon: Jackquline Denmark, MD;  Location: [facility] ENDOSCOPY;  Service: Endoscopy;  Laterality: N/A;   HOT HEMOSTASIS N/A 05/07/2021   Procedure: HOT HEMOSTASIS (ARGON PLASMA COAGULATION/BICAP);  Surgeon: Yetta Flock, MD;  Location: [facility] ENDOSCOPY;  Service: Gastroenterology;  Laterality: N/A;   INSERTION OF DIALYSIS CATHETER N/A 02/08/2022   Procedure: INSERTION OF TUNNELED DIALYSIS CATHETER;  Surgeon: Angelia Mould, MD;  Location: [place];  Service: Vascular;  Laterality: N/A;   INTERTROCHANTERIC HIP FRACTURE SURGERY Left 11/2006   [name] 03/08/2011   INTRAVASCULAR ULTRASOUND/IVUS N/A 12/19/2019   Procedure: Intravascular Ultrasound/IVUS;  Surgeon: Jettie Booze, MD;  Location: [place] CV LAB;  Service: Cardiovascular;  Laterality: N/A;   IR FLUORO GUIDE CV LINE LEFT  03/14/2022   IR FLUORO GUIDE CV LINE RIGHT  07/24/2019   IR FLUORO GUIDE CV LINE RIGHT  07/30/2019   IR US GUIDE VASC ACCESS RIGHT  07/24/2019   IR US GUIDE VASC ACCESS RIGHT  07/30/2019   LAPAROSCOPIC CHOLECYSTECTOMY  05/2006   LIGATION OF ARTERIOVENOUS  FISTULA Left 02/08/2022   Procedure: LIGATION OF LEFT ARTERIOVENOUS  FISTULA;  Surgeon: Angelia Mould, MD;  Location: [place];  Service: Vascular;  Laterality: Left;   LIGATION OF COMPETING BRANCHES OF ARTERIOVENOUS FISTULA Left 11/05/2018   Procedure: LIGATION OF COMPETING BRANCHES OF ARTERIOVENOUS FISTULA  LEFT  ARM;  Surgeon: Marty Heck, MD;  [facility];  Service: Vascular;  Laterality: Left;   LUMBAR LAMINECTOMY/DECOMPRESSION MICRODISCECTOMY N/A 02/29/2016   Procedure: Left L4-5 Lateral Recess Decompression, Removal Extradural Intraspinal Facet Cyst;  Surgeon: Marybelle Killings, MD;  Location: [place];  Service: Orthopedics;  Laterality: N/A;   MULTIPLE TOOTH EXTRACTIONS     ORIF MANDIBULAR FRACTURE Left 08/13/2004   ORIF of left body fracture mandible with KLS Martin 2.3-mm six hole/notes 03/08/2011   RIGHT/LEFT HEART CATH AND CORONARY/GRAFT ANGIOGRAPHY N/A 12/19/2019   Procedure: RIGHT/LEFT HEART CATH AND CORONARY/GRAFT ANGIOGRAPHY;  Surgeon: Jettie Booze, MD;  Location: [place] CV LAB;  Service: Cardiovascular;  Laterality: N/A;   SCLEROTHERAPY  12/22/2019   Procedure: SCLEROTHERAPY;  Surgeon: Bryan Lemma,  Dominic Pea, DO;  Location: [place] ENDOSCOPY;  Service: Gastroenterology;;   Clide Deutscher  02/13/2021   Procedure: Clide Deutscher;  Surgeon: Jackquline Denmark, MD;  Location: [facility] ENDOSCOPY;  Service: Endoscopy;;   Social History:   reports that he has been smoking cigarettes. He has a 21.50 pack-year smoking history. He has never used smokeless tobacco. He reports current alcohol use of about 12.0 standard drinks per week. He reports that he does not currently use drugs after having used the following drugs: Cocaine.  Family History  Problem Relation Age of Onset   Heart failure Father    Hypertension Father    Diabetes Brother    Heart attack Brother    Alzheimer's disease Mother    Stroke Sister    Diabetes Sister    Alzheimer's disease Sister    Hypertension Brother    Diabetes Brother    Drug abuse Brother    Colon cancer Neg Hx     Medications: Patient's  Medications  New Prescriptions   No medications on file  Previous Medications   ACETAMINOPHEN (TYLENOL) 650 MG CR TABLET    Take 650 mg by mouth 3 (three) times daily.   ALLOPURINOL (ZYLOPRIM) 100 MG TABLET    TAKE 1 TABLET(100 MG) BY MOUTH DAILY   ANORO ELLIPTA 62.5-25 MCG/INH AEPB    INHALE 1 PUFF BY MOUTH EVERY DAY   ATORVASTATIN (LIPITOR) 10 MG TABLET    TAKE 1 TABLET(10 MG) BY MOUTH DAILY   B-D UF III MINI PEN NEEDLES 31G X 5 MM MISC    USE FOUR TIMES DAILY   BIKTARVY 50-200-25 MG TABS TABLET    Take 1 tablet by mouth daily.   CALCIUM ACETATE 667 MG TABS    Take 667-1,334 mg by mouth See admin instructions. Take 1,334 mg by mouth three times a day with meals and 667 mg with each snack   CARVEDILOL (COREG) 3.125 MG TABLET    TAKE 1 TABLET(3.125 MG) BY MOUTH TWICE DAILY   COLCHICINE 0.6 MG TABLET    TAKE 1 TABLET BY MOUTH AS NEEDED FOR GOUT   DICLOFENAC SODIUM (VOLTAREN) 1 % GEL    APPLY 2 GRAMS TOPICALLY TO THE AFFECTED AREA THREE TIMES DAILY AS NEEDED FOR PAIN   FOLIC ACID (FOLVITE) 1 MG TABLET    TAKE 1 TABLET(1 MG) BY MOUTH DAILY   GABAPENTIN (NEURONTIN) 300 MG CAPSULE    TAKE 1 CAPSULE BY MOUTH DAILY. DOSE CAHNGE DUE TO RENAL FUNCTION   GLUCOSE BLOOD (ONETOUCH ULTRA) TEST STRIP    Check blood sugar three times daily E11.40   INSULIN LISPRO (HUMALOG) 100 UNIT/ML KWIKPEN    INJECT 13 UNITS UNDER THE SKIN THREE TIMES DAILY AS NEEDED FOR HIGH BLOOD SUGAR(ABOVE 150)   ISOSORBIDE MONONITRATE (IMDUR) 30 MG 24 HR TABLET    TAKE 1 TABLET BY MOUTH EVERY DAY   LANCETS (ONETOUCH DELICA PLUS QIONGE95M) MISC    1 Device by Other route 3 (three) times daily. E11.42   LATANOPROST (XALATAN) 0.005 % OPHTHALMIC SOLUTION    Place 1 drop into both eyes at bedtime.   LIDOCAINE-PRILOCAINE (EMLA) CREAM    Apply 1 application topically every Monday, Wednesday, and Friday with hemodialysis.   METHOXY PEG-EPOETIN BETA (MIRCERA IJ)    Mircera   MULTIVITAMIN (RENA-VIT) TABS TABLET    Take 1 tablet by mouth daily.    NITROGLYCERIN (NITROSTAT) 0.3 MG SL TABLET    PLACE 1 TABLET UNDER THE TONGUE AS NEEDED FOR CHEST PAIN   OCTREOTIDE (  SANDOSTATIN LAR DEPOT) 10 MG INJECTION    Inject 10 mg into the muscle every 28 days.   ONDANSETRON (ZOFRAN) 4 MG TABLET    Take 1 tablet (4 mg total) by mouth every 8 (eight) hours as needed for nausea or vomiting.   OXYCODONE-ACETAMINOPHEN (PERCOCET/ROXICET) 5-325 MG TABLET    Take 1 tablet by mouth every 4 (four) hours as needed for severe pain.   POLYETHYLENE GLYCOL (MIRALAX / GLYCOLAX) 17 G PACKET    Take 17 g by mouth daily as needed for mild constipation.   TIMOLOL (TIMOPTIC) 0.5 % OPHTHALMIC SOLUTION    Place 1 drop into both eyes in the morning.  Modified Medications   No medications on file  Discontinued Medications   No medications on file    Physical Exam:  Vitals:   03/22/22 0949  BP: 112/74  Pulse: 77  Resp: 18  Temp: (!) 97 F (36.1 C)  TempSrc: Temporal  SpO2: 99%  Weight: 181 lb 9.6 oz (82.4 kg)  Height: '5\' 9"'$  (1.753 m)   Body mass index is 26.82 kg/m. Wt Readings from Last 3 Encounters:  03/22/22 181 lb 9.6 oz (82.4 kg)  03/10/22 174 lb (78.9 kg)  03/03/22 170 lb (77.1 kg)    Physical Exam Vitals and nursing note reviewed.  Constitutional:      Appearance: Normal appearance.  Cardiovascular:     Rate and Rhythm: Normal rate and regular rhythm.  Pulmonary:     Effort: Pulmonary effort is normal.     Breath sounds: Normal breath sounds.  Musculoskeletal:        General: Normal range of motion.  Neurological:     General: No focal deficit present.     Mental Status: He is alert and oriented to person, place, and time.    Labs reviewed: Basic Metabolic Panel: Recent Labs    05/03/21 2135 05/04/21 0734 05/05/21 0308 05/06/21 1056 05/07/21 0406 06/23/21 1452 06/24/21 0800 07/08/21 1929 10/07/21 0000 12/01/21 0000 01/21/22 0659 01/21/22 0721 02/08/22 0601 02/24/22 0927  NA 136 136   < > 135 134*   < > 133* 131*   < > 137  134* 135 135 134*  K 4.4 5.4*   < > 4.0 4.0   < > 6.6* 4.3   < > 5.2 5.2* 5.2* 5.2* 4.6  CL 98 100   < > 98 98   < > 98 91*   < > 97* 91* 97* 99 92*  CO2 27 25   < > 26 26   < > 22 27  --   --  27  --   --  31  GLUCOSE 147* 117*   < > 132* 110*   < > 133* 173*  --   --  194* 195* 113* 148*  BUN 96* 115*   < > 70* 79*   < > 128* 51*   < > 81* 62* 55* 52* 29*  CREATININE 5.98* 6.96*   < > 6.67* 7.71*   < > 11.14* 6.74*   < > 10.4* 10.54* 11.20* 9.80* 6.53*  CALCIUM 8.0* 7.8*   < > 7.5* 7.9*   < > 9.3 9.1   < > 10.1 9.1  --   --  9.9  MG 2.1 2.1  --   --  1.9  --   --   --   --   --   --   --   --   --   PHOS  4.2 5.3*  --  5.1* 5.9*  --  5.2*  --   --   --   --   --   --   --    < > = values in this interval not displayed.   Liver Function Tests: Recent Labs    06/23/21 1452 06/24/21 0800 10/07/21 0000 11/03/21 0000 01/21/22 0659 02/24/22 0927  AST 22  --   --   --  16 23  ALT 18  --   --   --  11 11  ALKPHOS 60  --   --  84 55  --   BILITOT 0.4  --   --   --  0.6 0.4  PROT 7.7  --   --   --  7.1 7.6  ALBUMIN 2.6* 2.5* 4.2  --  3.2*  --    Recent Labs    05/02/21 0743 01/21/22 0659  LIPASE 87* 58*   Recent Labs    05/05/21 1708  AMMONIA 49*   CBC: Recent Labs    05/03/21 0046 05/03/21 0101 06/23/21 1452 06/24/21 0800 07/08/21 1929 10/07/21 0000 12/01/21 0000 12/08/21 0000 01/21/22 0659 01/21/22 0721 02/08/22 0601 02/24/22 0927  WBC 6.5   < > 8.2   < > 4.7   < > 4.7  --  6.3  --   --  4.0  NEUTROABS 4.7  --  6.0  --   --   --   --   --   --   --   --   --   HGB 7.0*   < > 8.2*   < > 7.9*   < > 12.0*   < > 9.2* 9.9* 12.2* 11.6*  HCT 22.7*   < > 25.7*   < > 25.6*   < > 36*  --  29.1* 29.0* 36.0* 34.9*  MCV 92.3   < > 97.3   < > 97.3  --   --   --  109.8*  --   --  103.9*  PLT 101*   < > 228   < > 214   < > 156  --  148*  --   --  125*   < > = values in this interval not displayed.   Lipid Panel: Recent Labs    05/13/21 0844 02/24/22 0927  CHOL 111 154   HDL 32* 34*  LDLCALC 58 80  TRIG 111 332*  CHOLHDL 3.5 4.5   TSH: No results for input(s): TSH in the last 8760 hours. A1C: Lab Results  Component Value Date   HGBA1C 6.9 11/03/2021     Assessment/Plan  1. Tobacco use Patient continues to smoke.  Has not had low-dose CT to screen for lung cancer recently.  We will order that today  2. Type 2 diabetes mellitus with diabetic polyneuropathy, with long-term current use of insulin (HCC) Continues on long and short acting insulin A1c is due now.  Generally his sugars are running between 110 and 190 at home  3. Opioid use This was given initially for chronic back and leg pain.  That seems to be controlled with oxycodone.  He is using as prescribed  4. Congestive heart failure, unspecified HF chronicity, unspecified heart failure type ([place]) I think dialysis helps to manage his fluid and symptoms of CHF have been compensated in recent months  5. Chronic bilateral low back pain without sciatica Symptoms improved with oxycodone  6. ESRD on hemodialysis ([place]) Continues with dialysis  3 times a week  7. Essential hypertension Blood pressure is well controlled on dialysis  8. Human immunodeficiency virus (HIV) disease ([place]) Followed by infectious disease and counts are acceptable  9. Hyperlipidemia associated with type 2 diabetes mellitus ([place]) Lipids were last assessed earlier this month and LDL is at goal at [place], MD [place] [phone]

## 2022-03-23 DIAGNOSIS — D631 Anemia in chronic kidney disease: Secondary | ICD-10-CM | POA: Diagnosis not present

## 2022-03-23 DIAGNOSIS — T8249XA Other complication of vascular dialysis catheter, initial encounter: Secondary | ICD-10-CM | POA: Diagnosis not present

## 2022-03-23 DIAGNOSIS — I129 Hypertensive chronic kidney disease with stage 1 through stage 4 chronic kidney disease, or unspecified chronic kidney disease: Secondary | ICD-10-CM | POA: Diagnosis not present

## 2022-03-23 DIAGNOSIS — D689 Coagulation defect, unspecified: Secondary | ICD-10-CM | POA: Diagnosis not present

## 2022-03-23 DIAGNOSIS — N2581 Secondary hyperparathyroidism of renal origin: Secondary | ICD-10-CM | POA: Diagnosis not present

## 2022-03-23 DIAGNOSIS — Z992 Dependence on renal dialysis: Secondary | ICD-10-CM | POA: Diagnosis not present

## 2022-03-23 DIAGNOSIS — N186 End stage renal disease: Secondary | ICD-10-CM | POA: Diagnosis not present

## 2022-03-23 LAB — HEMOGLOBIN A1C
Hgb A1c MFr Bld: 5.9 % of total Hgb — ABNORMAL HIGH (ref <5.7)
Mean Plasma Glucose: 123 mg/dL
eAG (mmol/L): 6.8 mmol/L

## 2022-03-24 ENCOUNTER — Ambulatory Visit (INDEPENDENT_AMBULATORY_CARE_PROVIDER_SITE_OTHER): Payer: Medicare Other | Admitting: Physician Assistant

## 2022-03-24 ENCOUNTER — Ambulatory Visit (HOSPITAL_COMMUNITY)
Admission: RE | Admit: 2022-03-24 | Discharge: 2022-03-24 | Disposition: A | Discharge status: Home / Self Care | Payer: Medicare Other | Source: Ambulatory Visit | Attending: Vascular Surgery | Admitting: Vascular Surgery

## 2022-03-24 ENCOUNTER — Ambulatory Visit (INDEPENDENT_AMBULATORY_CARE_PROVIDER_SITE_OTHER)
Admission: RE | Admit: 2022-03-24 | Discharge: 2022-03-24 | Disposition: A | Discharge status: Home / Self Care | Payer: Medicare Other | Source: Ambulatory Visit | Attending: Vascular Surgery | Admitting: Vascular Surgery

## 2022-03-24 ENCOUNTER — Other Ambulatory Visit (HOSPITAL_COMMUNITY): Payer: Self-pay

## 2022-03-24 ENCOUNTER — Encounter (HOSPITAL_COMMUNITY): Payer: Medicaid Other

## 2022-03-24 VITALS — BP 122/64 | HR 66 | Temp 98.2°F | Resp 20 | Ht 69.0 in | Wt 179.7 lb

## 2022-03-24 DIAGNOSIS — Z992 Dependence on renal dialysis: Secondary | ICD-10-CM

## 2022-03-24 DIAGNOSIS — N186 End stage renal disease: Secondary | ICD-10-CM

## 2022-03-24 LAB — DRUG MONITOR, PANEL 1, W/CONF, URINE
Amphetamines: NEGATIVE ng/mL (ref <500)
Barbiturates: NEGATIVE ng/mL (ref <300)
Benzodiazepines: NEGATIVE ng/mL (ref <100)
Cocaine Metabolite: NEGATIVE ng/mL (ref <150)
Codeine: NEGATIVE ng/mL (ref <50)
Creatinine: 193.6 mg/dL (ref >=20.0)
Hydrocodone: NEGATIVE ng/mL (ref <50)
Hydromorphone: NEGATIVE ng/mL (ref <50)
Marijuana Metabolite: NEGATIVE ng/mL (ref <20)
Methadone Metabolite: NEGATIVE ng/mL (ref <100)
Morphine: NEGATIVE ng/mL (ref <50)
Norhydrocodone: NEGATIVE ng/mL (ref <50)
Noroxycodone: 118 ng/mL — ABNORMAL HIGH (ref <50)
Opiates: NEGATIVE ng/mL (ref <100)
Oxidant: NEGATIVE ug/mL (ref <200)
Oxycodone: 333 ng/mL — ABNORMAL HIGH (ref <50)
Oxycodone: POSITIVE ng/mL — AB (ref <100)
Oxymorphone: 2946 ng/mL — ABNORMAL HIGH (ref <50)
Phencyclidine: NEGATIVE ng/mL (ref <25)
pH: 5.5 (ref 4.5–9.0)

## 2022-03-24 LAB — DM TEMPLATE

## 2022-03-25 ENCOUNTER — Telehealth: Payer: Self-pay

## 2022-03-25 ENCOUNTER — Other Ambulatory Visit (HOSPITAL_COMMUNITY): Payer: Self-pay

## 2022-03-25 DIAGNOSIS — T8249XA Other complication of vascular dialysis catheter, initial encounter: Secondary | ICD-10-CM | POA: Diagnosis not present

## 2022-03-25 DIAGNOSIS — N186 End stage renal disease: Secondary | ICD-10-CM | POA: Diagnosis not present

## 2022-03-25 DIAGNOSIS — D631 Anemia in chronic kidney disease: Secondary | ICD-10-CM | POA: Diagnosis not present

## 2022-03-25 DIAGNOSIS — Z992 Dependence on renal dialysis: Secondary | ICD-10-CM | POA: Diagnosis not present

## 2022-03-25 DIAGNOSIS — N2581 Secondary hyperparathyroidism of renal origin: Secondary | ICD-10-CM | POA: Diagnosis not present

## 2022-03-25 NOTE — Telephone Encounter (Signed)
CMN received - shoes ordered and casts released from hold  [place] 529 blue [id number]

## 2022-03-28 ENCOUNTER — Other Ambulatory Visit (HOSPITAL_COMMUNITY): Payer: Self-pay

## 2022-03-28 ENCOUNTER — Other Ambulatory Visit: Payer: Self-pay | Admitting: *Deleted

## 2022-03-28 ENCOUNTER — Other Ambulatory Visit: Payer: Self-pay

## 2022-03-28 DIAGNOSIS — N2581 Secondary hyperparathyroidism of renal origin: Secondary | ICD-10-CM | POA: Diagnosis not present

## 2022-03-28 DIAGNOSIS — M48061 Spinal stenosis, lumbar region without neurogenic claudication: Secondary | ICD-10-CM

## 2022-03-28 DIAGNOSIS — Z992 Dependence on renal dialysis: Secondary | ICD-10-CM | POA: Diagnosis not present

## 2022-03-28 DIAGNOSIS — D631 Anemia in chronic kidney disease: Secondary | ICD-10-CM | POA: Diagnosis not present

## 2022-03-28 DIAGNOSIS — N186 End stage renal disease: Secondary | ICD-10-CM | POA: Diagnosis not present

## 2022-03-28 DIAGNOSIS — T8249XA Other complication of vascular dialysis catheter, initial encounter: Secondary | ICD-10-CM | POA: Diagnosis not present

## 2022-03-28 MED ORDER — COLCHICINE 0.6 MG PO TABS: ORAL_TABLET | ORAL | 5 refills | Status: DC

## 2022-03-28 NOTE — Telephone Encounter (Signed)
[facility] sent a Fax for medication Clarification:  Stated:  "Plan requires specific directions to process the prescription. Please fax new Rx with frequency of how patent will be using the medication and days supply limitations."  Please Advise. (Forwarded to [place] due to Dr. Sabra Heck out of office)   colchicine 0.6 MG tablet [[id number]]    Order Details Dose, Route, Frequency: As Directed  Dispense Quantity: 30 tablet Refills: 1        Sig: TAKE 1 TABLET BY MOUTH AS NEEDED FOR GOUT

## 2022-03-28 NOTE — Telephone Encounter (Signed)
Patient called regarding refill on his Oxycodone. Rx already Pended.

## 2022-03-28 NOTE — Telephone Encounter (Signed)
Should be daily

## 2022-03-28 NOTE — Telephone Encounter (Signed)
Rx updated and Pended for Approval due to [place].

## 2022-03-28 NOTE — Telephone Encounter (Signed)
RX last refilled on 03/15/22 #30   Patient states he is in needed of a refill as a result of all the pain he is having associated with dialysis in which Dr.Miller is aware of.  Treatment agreement on file from 07/20/2021

## 2022-03-29 ENCOUNTER — Ambulatory Visit (INDEPENDENT_AMBULATORY_CARE_PROVIDER_SITE_OTHER): Payer: Medicare Other | Admitting: Family Medicine

## 2022-03-29 ENCOUNTER — Encounter (HOSPITAL_BASED_OUTPATIENT_CLINIC_OR_DEPARTMENT_OTHER): Payer: Medicare Other | Attending: General Surgery | Admitting: General Surgery

## 2022-03-29 ENCOUNTER — Ambulatory Visit (INDEPENDENT_AMBULATORY_CARE_PROVIDER_SITE_OTHER): Payer: Medicare Other | Admitting: Podiatry

## 2022-03-29 ENCOUNTER — Other Ambulatory Visit: Payer: Self-pay | Admitting: *Deleted

## 2022-03-29 ENCOUNTER — Encounter: Payer: Self-pay | Admitting: Family Medicine

## 2022-03-29 DIAGNOSIS — Z951 Presence of aortocoronary bypass graft: Secondary | ICD-10-CM | POA: Diagnosis not present

## 2022-03-29 DIAGNOSIS — M79675 Pain in left toe(s): Secondary | ICD-10-CM | POA: Diagnosis not present

## 2022-03-29 DIAGNOSIS — E0842 Diabetes mellitus due to underlying condition with diabetic polyneuropathy: Secondary | ICD-10-CM | POA: Diagnosis not present

## 2022-03-29 DIAGNOSIS — L97512 Non-pressure chronic ulcer of other part of right foot with fat layer exposed: Secondary | ICD-10-CM | POA: Diagnosis not present

## 2022-03-29 DIAGNOSIS — L84 Corns and callosities: Secondary | ICD-10-CM

## 2022-03-29 DIAGNOSIS — J449 Chronic obstructive pulmonary disease, unspecified: Secondary | ICD-10-CM | POA: Insufficient documentation

## 2022-03-29 DIAGNOSIS — E785 Hyperlipidemia, unspecified: Secondary | ICD-10-CM | POA: Diagnosis not present

## 2022-03-29 DIAGNOSIS — B351 Tinea unguium: Secondary | ICD-10-CM | POA: Diagnosis not present

## 2022-03-29 DIAGNOSIS — M79674 Pain in right toe(s): Secondary | ICD-10-CM | POA: Diagnosis not present

## 2022-03-29 DIAGNOSIS — E1122 Type 2 diabetes mellitus with diabetic chronic kidney disease: Secondary | ICD-10-CM | POA: Diagnosis not present

## 2022-03-29 DIAGNOSIS — Z794 Long term (current) use of insulin: Secondary | ICD-10-CM | POA: Diagnosis not present

## 2022-03-29 DIAGNOSIS — I5042 Chronic combined systolic (congestive) and diastolic (congestive) heart failure: Secondary | ICD-10-CM | POA: Diagnosis not present

## 2022-03-29 DIAGNOSIS — I252 Old myocardial infarction: Secondary | ICD-10-CM | POA: Insufficient documentation

## 2022-03-29 DIAGNOSIS — G894 Chronic pain syndrome: Secondary | ICD-10-CM | POA: Diagnosis not present

## 2022-03-29 DIAGNOSIS — I132 Hypertensive heart and chronic kidney disease with heart failure and with stage 5 chronic kidney disease, or end stage renal disease: Secondary | ICD-10-CM | POA: Diagnosis not present

## 2022-03-29 DIAGNOSIS — E1140 Type 2 diabetes mellitus with diabetic neuropathy, unspecified: Secondary | ICD-10-CM | POA: Insufficient documentation

## 2022-03-29 DIAGNOSIS — B20 Human immunodeficiency virus [HIV] disease: Secondary | ICD-10-CM | POA: Diagnosis not present

## 2022-03-29 DIAGNOSIS — M1990 Unspecified osteoarthritis, unspecified site: Secondary | ICD-10-CM | POA: Insufficient documentation

## 2022-03-29 DIAGNOSIS — N186 End stage renal disease: Secondary | ICD-10-CM | POA: Diagnosis not present

## 2022-03-29 DIAGNOSIS — F17210 Nicotine dependence, cigarettes, uncomplicated: Secondary | ICD-10-CM | POA: Diagnosis not present

## 2022-03-29 DIAGNOSIS — L98492 Non-pressure chronic ulcer of skin of other sites with fat layer exposed: Secondary | ICD-10-CM | POA: Diagnosis not present

## 2022-03-29 DIAGNOSIS — E11621 Type 2 diabetes mellitus with foot ulcer: Secondary | ICD-10-CM | POA: Insufficient documentation

## 2022-03-29 DIAGNOSIS — N2581 Secondary hyperparathyroidism of renal origin: Secondary | ICD-10-CM | POA: Diagnosis not present

## 2022-03-29 DIAGNOSIS — I10 Essential (primary) hypertension: Secondary | ICD-10-CM | POA: Diagnosis not present

## 2022-03-29 DIAGNOSIS — M48061 Spinal stenosis, lumbar region without neurogenic claudication: Secondary | ICD-10-CM

## 2022-03-29 DIAGNOSIS — L98499 Non-pressure chronic ulcer of skin of other sites with unspecified severity: Secondary | ICD-10-CM | POA: Diagnosis not present

## 2022-03-29 MED ORDER — NITROGLYCERIN 0.3 MG SL SUBL: SUBLINGUAL_TABLET | SUBLINGUAL | 1 refills | Status: DC

## 2022-03-29 MED ORDER — OXYCODONE-ACETAMINOPHEN 5-325 MG PO TABS: 1.0000 | ORAL_TABLET | ORAL | 0 refills | Status: DC | PRN

## 2022-03-29 NOTE — Progress Notes (Signed)
Provider:  Alain Honey, MD  Careteam: Patient Care Team: [name], MD as PCP - General (Family Medicine) [name], MD as PCP - Infectious Diseases (Infectious Diseases) [name], MD as PCP - Cardiology (Cardiology) [name], MD as Consulting Physician (Ophthalmology) [name], MD as Consulting Physician (Cardiology) [name], MD as Consulting Physician (Infectious Diseases) Gardiner Barefoot, DPM as Consulting Physician (Podiatry) [name], Nelda Bucks, NP as Nurse Practitioner (Family Medicine) Kidney, [facility]  PLACE OF SERVICE:  [place]  Advanced Directive information    Allergies  Allergen Reactions   Augmentin [Amoxicillin-Pot Clavulanate] Diarrhea and Other (See Comments)    Severe diarrhea   Mucinex Fast-Max Other (See Comments)    Intense sweating    Amphetamines Other (See Comments)    Unknown reaction    Chief Complaint  Patient presents with   Acute Visit    Patient would like to discuss lab results with Dr.Turkessa Ostrom.     HPI: Patient is a 71 y.o. male this was a telephone call.  Jacob [name] was reached at his home and I am here in [employer] care offices on [address] [facility]. A call was to explore possibilities of findings on urine drug screen that is part of our drug contract with this patient. I explained to him that the drug screen shows a rather high amount of oxymorphone which is one of the breakdown products of oxycodone but the percentage is inconsistent.  He has assured me that he is not taking this drug from any other source.  I then explored possibility of this finding with consultant at [facility] labs and they have reassured me that this finding is not totally unexpected.  Review of Systems:  ROS  Past Medical History:  Diagnosis Date   Acute respiratory failure ([place]) 03/01/2018   Anemia    Arthritis    "all over; mostly knees and back" (02/28/2018)   Chronic combined systolic and  diastolic CHF (congestive heart failure) (HCC)    Chronic lower back pain    stenosis   Community acquired pneumonia 09/06/2013   COPD (chronic obstructive pulmonary disease) ([place])    Coronary atherosclerosis of native coronary artery    a. 02/2003 s/p CABG x 2 (VG->RI, VG->RPDA; b. 11/2019 PCI: LM nl, LAD 90d, D3 50, RI 100, LCX 100p, OM3 100 - fills via L->L collats from D2/dLAD, RCA 100p, VG->RPDA ok, VG->RI 95 (3.5x48 Synergy XD DES).   Drug abuse ([place])    hx; tested for cocaine as recently as 2/08. says he is not using drugs now - avoided defib. for this reason    ESRD (end stage renal disease) ([place])    Hemo M-W-F- [facility]   Fall at home 10/2020   GERD (gastroesophageal reflux disease)    takes OTC meds as needed   GI bleeding    a. 11/2019 EGD: angiodysplastic lesions w/ bleeding s/p argon plasma/clipping/epi inj. Multiple admissions for the same.   Glaucoma    uses eye drops daily   Hepatitis B 1968   "tx'd w/isolation; caught it from toilet stools in gym"   History of blood transfusion 03/01/2019   History of colon polyps    benign   History of gout    takes Allopurinol daily as well as Colchicine-if needed (02/28/2018)   History of kidney stones    HTN (hypertension)    takes Coreg,Imdur.and Apresoline daily   Human immunodeficiency virus (HIV) disease ([place]) dx'd 1995  on Biktarvy as of 12/2020.     Hyperlipidemia    Ischemic cardiomyopathy    a. 01/2019 Echo: EF 40-45%, diffuse HK, mild basal septal hypertrophy. Diast dysfxn. Nl RV size/fxn. Sev dil LA. Triv MR/TR/PR.   Muscle spasm    takes Zanaflex as needed   Myocardial infarction ([place]) ~ 2004/2005   Nocturia    Peripheral neuropathy    takes gabapentin daily   Pneumonia    "at least twice" (02/28/2018)   SDH (subdural hematoma) (HCC)    Syphilis, unspecified    Type II diabetes mellitus ([place]) 2004   Lantus daily.Average fasting blood sugar 125-199   Wears glasses    Wears partial dentures    Past Surgical  History:  Procedure Laterality Date   AV FISTULA PLACEMENT Left 08/02/2018   Procedure: ARTERIOVENOUS (AV) FISTULA CREATION  left arm radiocephlic;  Surgeon: Marty Heck, MD;  Location: [place];  Service: Vascular;  Laterality: Left;   AV FISTULA PLACEMENT Left 08/01/2019   Procedure: LEFT BRACHIOCEPHALIC ARTERIOVENOUS (AV) FISTULA CREATION;  Surgeon: [name], MD;  Location: [place];  Service: Vascular;  Laterality: Left;   [place] Left 10/03/2019   Procedure: BASILIC VEIN TRANSPOSITION LEFT SECOND STAGE;  Surgeon: [name], MD;  Location: [place];  Service: Vascular;  Laterality: Left;   BIOPSY  01/25/2021   Procedure: BIOPSY;  Surgeon: Doran Stabler, MD;  Location: [facility] ENDOSCOPY;  Service: Gastroenterology;;   CARDIAC CATHETERIZATION  10/2002; 12/19/2004   [name] 03/08/2011   COLONOSCOPY  2013   [place]    CORONARY ARTERY BYPASS GRAFT  02/24/2003   CABG X2/notes 03/08/2011   CORONARY STENT INTERVENTION N/A 12/19/2019   Procedure: CORONARY STENT INTERVENTION;  Surgeon: Jettie Booze, MD;  Location: [place] CV [facility];  Service: Cardiovascular;  Laterality: N/A;   ENTEROSCOPY N/A 01/25/2021   Procedure: ENTEROSCOPY;  Surgeon: Doran Stabler, MD;  Location: [place];  Service: Gastroenterology;  Laterality: N/A;   ENTEROSCOPY N/A 02/13/2021   Procedure: ENTEROSCOPY;  Surgeon: Jackquline Denmark, MD;  Location: [facility] ENDOSCOPY;  Service: Endoscopy;  Laterality: N/A;   ENTEROSCOPY N/A 05/07/2021   Procedure: ENTEROSCOPY;  Surgeon: Yetta Flock, MD;  Location: [facility] ENDOSCOPY;  Service: Gastroenterology;  Laterality: N/A;   ESOPHAGOGASTRODUODENOSCOPY (EGD) WITH PROPOFOL N/A 02/08/2019   Procedure: ESOPHAGOGASTRODUODENOSCOPY (EGD) WITH PROPOFOL;  Surgeon: Milus Banister, MD;  Location: [place];  Service: Gastroenterology;  Laterality: N/A;   ESOPHAGOGASTRODUODENOSCOPY (EGD) WITH PROPOFOL N/A 12/22/2019   Procedure: ESOPHAGOGASTRODUODENOSCOPY  (EGD) WITH PROPOFOL;  Surgeon: Lavena Bullion, DO;  Location: [place];  Service: Gastroenterology;  Laterality: N/A;   ESOPHAGOGASTRODUODENOSCOPY (EGD) WITH PROPOFOL N/A 10/19/2020   Procedure: ESOPHAGOGASTRODUODENOSCOPY (EGD) WITH PROPOFOL;  Surgeon: Jackquline Denmark, MD;  Location: [facility] ENDOSCOPY;  Service: Endoscopy;  Laterality: N/A;   ESOPHAGOGASTRODUODENOSCOPY (EGD) WITH PROPOFOL N/A 12/22/2020   Procedure: ESOPHAGOGASTRODUODENOSCOPY (EGD) WITH PROPOFOL;  Surgeon: Gatha Mayer, MD;  Location: [place];  Service: Endoscopy;  Laterality: N/A;   ESOPHAGOGASTRODUODENOSCOPY (EGD) WITH PROPOFOL N/A 01/09/2021   Procedure: ESOPHAGOGASTRODUODENOSCOPY (EGD) WITH PROPOFOL;  Surgeon: Irene Shipper, MD;  Location: [facility] ENDOSCOPY;  Service: Endoscopy;  Laterality: N/A;   HEMOSTASIS CLIP PLACEMENT  12/22/2019   Procedure: HEMOSTASIS CLIP PLACEMENT;  Surgeon: Lavena Bullion, DO;  Location: [place];  Service: Gastroenterology;;   HEMOSTASIS CLIP PLACEMENT  12/22/2020   Procedure: HEMOSTASIS CLIP PLACEMENT;  Surgeon: Gatha Mayer, MD;  Location: [facility] ENDOSCOPY;  Service: Endoscopy;;  HEMOSTASIS CONTROL  12/22/2020   Procedure: HEMOSTASIS CONTROL/hemospray;  Surgeon: Gatha Mayer, MD;  Location: [place];  Service: Endoscopy;;   HOT HEMOSTASIS N/A 02/08/2019   Procedure: HOT HEMOSTASIS (ARGON PLASMA COAGULATION/BICAP);  Surgeon: Milus Banister, MD;  Location: [facility] ENDOSCOPY;  Service: Gastroenterology;  Laterality: N/A;   HOT HEMOSTASIS N/A 12/22/2019   Procedure: HOT HEMOSTASIS (ARGON PLASMA COAGULATION/BICAP);  Surgeon: Lavena Bullion, DO;  Location: [facility] ENDOSCOPY;  Service: Gastroenterology;  Laterality: N/A;   HOT HEMOSTASIS N/A 10/19/2020   Procedure: HOT HEMOSTASIS (ARGON PLASMA COAGULATION/BICAP);  Surgeon: Jackquline Denmark, MD;  Location: [facility] ENDOSCOPY;  Service: Endoscopy;  Laterality: N/A;   HOT HEMOSTASIS N/A 12/22/2020   Procedure: HOT HEMOSTASIS (ARGON PLASMA COAGULATION/BICAP);   Surgeon: Gatha Mayer, MD;  Location: [facility] ENDOSCOPY;  Service: Endoscopy;  Laterality: N/A;   HOT HEMOSTASIS N/A 01/09/2021   Procedure: HOT HEMOSTASIS (ARGON PLASMA COAGULATION/BICAP);  Surgeon: Irene Shipper, MD;  Location: [facility] ENDOSCOPY;  Service: Endoscopy;  Laterality: N/A;   HOT HEMOSTASIS N/A 01/25/2021   Procedure: HOT HEMOSTASIS (ARGON PLASMA COAGULATION/BICAP);  Surgeon: Doran Stabler, MD;  Location: [place];  Service: Gastroenterology;  Laterality: N/A;   HOT HEMOSTASIS N/A 02/13/2021   Procedure: HOT HEMOSTASIS (ARGON PLASMA COAGULATION/BICAP);  Surgeon: Jackquline Denmark, MD;  Location: [facility] ENDOSCOPY;  Service: Endoscopy;  Laterality: N/A;   HOT HEMOSTASIS N/A 05/07/2021   Procedure: HOT HEMOSTASIS (ARGON PLASMA COAGULATION/BICAP);  Surgeon: Yetta Flock, MD;  Location: [facility] ENDOSCOPY;  Service: Gastroenterology;  Laterality: N/A;   INSERTION OF DIALYSIS CATHETER N/A 02/08/2022   Procedure: INSERTION OF TUNNELED DIALYSIS CATHETER;  Surgeon: Angelia Mould, MD;  Location: [place];  Service: Vascular;  Laterality: N/A;   INTERTROCHANTERIC HIP FRACTURE SURGERY Left 11/2006   [name] 03/08/2011   INTRAVASCULAR ULTRASOUND/IVUS N/A 12/19/2019   Procedure: Intravascular Ultrasound/IVUS;  Surgeon: Jettie Booze, MD;  Location: [place] CV LAB;  Service: Cardiovascular;  Laterality: N/A;   IR FLUORO GUIDE CV LINE LEFT  03/14/2022   IR FLUORO GUIDE CV LINE RIGHT  07/24/2019   IR FLUORO GUIDE CV LINE RIGHT  07/30/2019   IR US GUIDE VASC ACCESS RIGHT  07/24/2019   IR US GUIDE VASC ACCESS RIGHT  07/30/2019   LAPAROSCOPIC CHOLECYSTECTOMY  05/2006   LIGATION OF ARTERIOVENOUS  FISTULA Left 02/08/2022   Procedure: LIGATION OF LEFT ARTERIOVENOUS  FISTULA;  Surgeon: Angelia Mould, MD;  Location: [place];  Service: Vascular;  Laterality: Left;   LIGATION OF COMPETING BRANCHES OF ARTERIOVENOUS FISTULA Left 11/05/2018   Procedure: LIGATION OF COMPETING BRANCHES OF ARTERIOVENOUS  FISTULA  LEFT  ARM;  Surgeon: Marty Heck, MD;  Location[facility];  Service: Vascular;  Laterality: Left;   LUMBAR LAMINECTOMY/DECOMPRESSION MICRODISCECTOMY N/A 02/29/2016   Procedure: Left L4-5 Lateral Recess Decompression, Removal Extradural Intraspinal Facet Cyst;  Surgeon: Marybelle Killings, MD;  Location: [place];  Service: Orthopedics;  Laterality: N/A;   MULTIPLE TOOTH EXTRACTIONS     ORIF MANDIBULAR FRACTURE Left 08/13/2004   ORIF of left body fracture mandible with KLS Martin 2.3-mm six hole/notes 03/08/2011   RIGHT/LEFT HEART CATH AND CORONARY/GRAFT ANGIOGRAPHY N/A 12/19/2019   Procedure: RIGHT/LEFT HEART CATH AND CORONARY/GRAFT ANGIOGRAPHY;  Surgeon: Jettie Booze, MD;  Location: [place] CV LAB;  Service: Cardiovascular;  Laterality: N/A;   SCLEROTHERAPY  12/22/2019   Procedure: SCLEROTHERAPY;  Surgeon: Lavena Bullion, DO;  Location: [place];  Service: Gastroenterology;;   Clide Deutscher  02/13/2021   Procedure: SCLEROTHERAPY;  Surgeon: Jackquline Denmark, MD;  Location: [facility] ENDOSCOPY;  Service: Endoscopy;;   Social History:   reports that he has been smoking cigarettes. He has a 21.50 pack-year smoking history. He has never been exposed to tobacco smoke. He has never used smokeless tobacco. He reports current alcohol use of about 12.0 standard drinks per week. He reports that he does not currently use drugs after having used the following drugs: Cocaine.  Family History  Problem Relation Age of Onset   Heart failure Father    Hypertension Father    Diabetes Brother    Heart attack Brother    Alzheimer's disease Mother    Stroke Sister    Diabetes Sister    Alzheimer's disease Sister    Hypertension Brother    Diabetes Brother    Drug abuse Brother    Colon cancer Neg Hx     Medications: Patient's Medications  New Prescriptions   No medications on file  Previous Medications   ACETAMINOPHEN (TYLENOL) 650 MG CR TABLET    Take 650 mg by mouth 3 (three) times daily.    ALLOPURINOL (ZYLOPRIM) 100 MG TABLET    TAKE 1 TABLET(100 MG) BY MOUTH DAILY   ANORO ELLIPTA 62.5-25 MCG/INH AEPB    INHALE 1 PUFF BY MOUTH EVERY DAY   ATORVASTATIN (LIPITOR) 10 MG TABLET    TAKE 1 TABLET(10 MG) BY MOUTH DAILY   B-D UF III MINI PEN NEEDLES 31G X 5 MM MISC    USE FOUR TIMES DAILY   BIKTARVY 50-200-25 MG TABS TABLET    Take 1 tablet by mouth daily.   CALCIUM ACETATE 667 MG TABS    Take 667-1,334 mg by mouth See admin instructions. Take 1,334 mg by mouth three times a day with meals and 667 mg with each snack   CARVEDILOL (COREG) 3.125 MG TABLET    TAKE 1 TABLET(3.125 MG) BY MOUTH TWICE DAILY   COLCHICINE 0.6 MG TABLET    Take one tablet by mouth once daily as needed for gout.   DICLOFENAC SODIUM (VOLTAREN) 1 % GEL    APPLY 2 GRAMS TOPICALLY TO THE AFFECTED AREA THREE TIMES DAILY AS NEEDED FOR PAIN   FOLIC ACID (FOLVITE) 1 MG TABLET    TAKE 1 TABLET(1 MG) BY MOUTH DAILY   GABAPENTIN (NEURONTIN) 300 MG CAPSULE    TAKE 1 CAPSULE BY MOUTH DAILY. DOSE CAHNGE DUE TO RENAL FUNCTION   GLUCOSE BLOOD (ONETOUCH ULTRA) TEST STRIP    Check blood sugar three times daily E11.40   INSULIN LISPRO (HUMALOG) 100 UNIT/ML KWIKPEN    INJECT 13 UNITS UNDER THE SKIN THREE TIMES DAILY AS NEEDED FOR HIGH BLOOD SUGAR(ABOVE 150)   ISOSORBIDE MONONITRATE (IMDUR) 30 MG 24 HR TABLET    TAKE 1 TABLET BY MOUTH EVERY DAY   LANCETS (ONETOUCH DELICA PLUS [id number]) MISC    1 Device by Other route 3 (three) times daily. E11.42   LATANOPROST (XALATAN) 0.005 % OPHTHALMIC SOLUTION    Place 1 drop into both eyes at bedtime.   LIDOCAINE-PRILOCAINE (EMLA) CREAM    Apply 1 application topically every Monday, Wednesday, and Friday with hemodialysis.   METHOXY PEG-EPOETIN BETA (MIRCERA IJ)    Mircera   MULTIVITAMIN (RENA-VIT) TABS TABLET    Take 1 tablet by mouth daily.   NITROGLYCERIN (NITROSTAT) 0.3 MG SL TABLET    PLACE 1 TABLET UNDER THE TONGUE AS NEEDED FOR CHEST PAIN   OCTREOTIDE (SANDOSTATIN LAR DEPOT) 10 MG  INJECTION    Inject  10 mg into the muscle every 28 days.   ONDANSETRON (ZOFRAN) 4 MG TABLET    Take 1 tablet (4 mg total) by mouth every 8 (eight) hours as needed for nausea or vomiting.   OXYCODONE-ACETAMINOPHEN (PERCOCET/ROXICET) 5-325 MG TABLET    Take 1 tablet by mouth every 4 (four) hours as needed for severe pain.   POLYETHYLENE GLYCOL (MIRALAX / GLYCOLAX) 17 G PACKET    Take 17 g by mouth daily as needed for mild constipation.   TIMOLOL (TIMOPTIC) 0.5 % OPHTHALMIC SOLUTION    Place 1 drop into both eyes in the morning.  Modified Medications   No medications on file  Discontinued Medications   No medications on file    Physical Exam:  There were no vitals filed for this visit. There is no height or weight on file to calculate BMI. Wt Readings from Last 3 Encounters:  03/24/22 179 lb 11.2 oz (81.5 kg)  03/22/22 181 lb 9.6 oz (82.4 kg)  03/10/22 174 lb (78.9 kg)    Physical Exam  Labs reviewed: Basic Metabolic Panel: Recent Labs    05/03/21 2135 05/04/21 0734 05/05/21 0308 05/06/21 1056 05/07/21 0406 06/23/21 1452 06/24/21 0800 07/08/21 1929 10/07/21 0000 12/01/21 0000 01/21/22 0659 01/21/22 0721 02/08/22 0601 02/24/22 0927  NA 136 136   < > 135 134*   < > 133* 131*   < > 137 134* 135 135 134*  K 4.4 5.4*   < > 4.0 4.0   < > 6.6* 4.3   < > 5.2 5.2* 5.2* 5.2* 4.6  CL 98 100   < > 98 98   < > 98 91*   < > 97* 91* 97* 99 92*  CO2 27 25   < > 26 26   < > 22 27  --   --  27  --   --  31  GLUCOSE 147* 117*   < > 132* 110*   < > 133* 173*  --   --  194* 195* 113* 148*  BUN 96* 115*   < > 70* 79*   < > 128* 51*   < > 81* 62* 55* 52* 29*  CREATININE 5.98* 6.96*   < > 6.67* 7.71*   < > 11.14* 6.74*   < > 10.4* 10.54* 11.20* 9.80* 6.53*  CALCIUM 8.0* 7.8*   < > 7.5* 7.9*   < > 9.3 9.1   < > 10.1 9.1  --   --  9.9  MG 2.1 2.1  --   --  1.9  --   --   --   --   --   --   --   --   --   PHOS 4.2 5.3*  --  5.1* 5.9*  --  5.2*  --   --   --   --   --   --   --    < > = values  in this interval not displayed.   Liver Function Tests: Recent Labs    06/23/21 1452 06/24/21 0800 10/07/21 0000 11/03/21 0000 01/21/22 0659 02/24/22 0927  AST 22  --   --   --  16 23  ALT 18  --   --   --  11 11  ALKPHOS 60  --   --  84 55  --   BILITOT 0.4  --   --   --  0.6 0.4  PROT 7.7  --   --   --  7.1 7.6  ALBUMIN 2.6* 2.5* 4.2  --  3.2*  --    Recent Labs    05/02/21 0743 01/21/22 0659  LIPASE 87* 58*   Recent Labs    05/05/21 1708  AMMONIA 49*   CBC: Recent Labs    05/03/21 0046 05/03/21 0101 06/23/21 1452 06/24/21 0800 07/08/21 1929 10/07/21 0000 12/01/21 0000 12/08/21 0000 01/21/22 0659 01/21/22 0721 02/08/22 0601 02/24/22 0927  WBC 6.5   < > 8.2   < > 4.7   < > 4.7  --  6.3  --   --  4.0  NEUTROABS 4.7  --  6.0  --   --   --   --   --   --   --   --   --   HGB 7.0*   < > 8.2*   < > 7.9*   < > 12.0*   < > 9.2* 9.9* 12.2* 11.6*  HCT 22.7*   < > 25.7*   < > 25.6*   < > 36*  --  29.1* 29.0* 36.0* 34.9*  MCV 92.3   < > 97.3   < > 97.3  --   --   --  109.8*  --   --  103.9*  PLT 101*   < > 228   < > 214   < > 156  --  148*  --   --  125*   < > = values in this interval not displayed.   Lipid Panel: Recent Labs    05/13/21 0844 02/24/22 0927  CHOL 111 154  HDL 32* 34*  LDLCALC 58 80  TRIG 111 332*  CHOLHDL 3.5 4.5   TSH: No results for input(s): TSH in the last 8760 hours. A1C: Lab Results  Component Value Date   HGBA1C 5.9 (H) 03/22/2022     Assessment/Plan  1. Chronic pain syndrome Visit via telephone call. Pt informed of findings per consultant at [facility] labs, oxymorphone is a metabolite of oxycodone. Plan to continue rx this drug as pt is compliant and drug is effective   Alain Honey, MD [place] [phone]  Virtual Visit via Telephone Note  I connected with Jacob [name] on 03/29/22 at  2:00 PM EDT by telephone and verified that I am speaking with the correct person using two  identifiers.  Location: Patient: home Provider: [facility] office   I discussed the limitations, risks, security and privacy concerns of performing an evaluation and management service by telephone and the availability of in person appointments. I also discussed with the patient that there may be a patient responsible charge related to this service. The patient expressed understanding and agreed to proceed.   History of Present Illness:    Observations/Objective:   Assessment and Plan:   Follow Up Instructions:    I discussed the assessment and treatment plan with the patient. The patient was provided an opportunity to ask questions and all were answered. The patient agreed with the plan and demonstrated an understanding of the instructions.   The patient was advised to call back or seek an in-person evaluation if the symptoms worsen or if the condition fails to improve as anticipated.  I provided 10 minutes of non-face-to-face time during this encounter.   Alain Honey, MD

## 2022-03-29 NOTE — Telephone Encounter (Signed)
Jacob Honour, MD  Leigh Aurora C, CMA 53 minutes ago (8:18 AM)   Before prescription can be refilled, patient needs to explain why a second opiate is showing up in his drug screen       Scheduled Telephone appointment with Dr. Sabra Heck per Patient to discuss.

## 2022-03-29 NOTE — Progress Notes (Signed)
Jacob, [name] ([id number]) Visit Report for 03/29/2022 Chief Complaint Document Details Patient Name: Date of Service: Jacob [name], Jacob [name] 03/29/2022 11:00 A M Medical Record Number: [id number] Patient Account Number: [id number] Date of Birth/Sex: Treating RN: 31-Aug-1951 (71 y.o. [name] Primary Care Provider: Alain Honey Other Clinician: Referring Provider: Treating Provider/Extender: Neil Crouch in Treatment: 4 Information Obtained from: Patient Chief Complaint Patient seen for complaints of Non-Healing Wounds x 3 Electronic Signature(s) Signed: 03/29/2022 12:10:29 PM By: Fredirick Maudlin MD FACS Entered By: Fredirick Maudlin on 03/29/2022 12:10:29 -------------------------------------------------------------------------------- Debridement Details Patient Name: Date of Service: Jacob [name]. 03/29/2022 11:00 A M Medical Record Number: [id number] Patient Account Number: [id number] Date of Birth/Sex: Treating RN: 26-Sep-1951 (71 y.o. [name] Primary Care Provider: Alain Honey Other Clinician: Referring Provider: Treating Provider/Extender: Neil Crouch in Treatment: 4 Debridement Performed for Assessment: Wound #7 Left,Posterior Upper Arm Performed By: Physician Fredirick Maudlin, MD Debridement Type: Debridement Level of Consciousness (Pre-procedure): Awake and Alert Pre-procedure Verification/Time Out Yes - 11:26 Taken: Start Time: 11:26 Pain Control: Other : Benzocaine 20% T Area Debrided (L x W): otal 0.4 (cm) x 0.1 (cm) = 0.04 (cm) Tissue and other material debrided: Non-Viable, Eschar, Slough, Slough Level: Non-Viable Tissue Debridement Description: Selective/Open Wound Instrument: Curette Bleeding: Minimum Hemostasis Achieved: Pressure End Time: 11:27 Procedural Pain: 0 Post Procedural Pain: 0 Response to Treatment: Procedure was tolerated well Level of Consciousness (Post- Awake  and Alert procedure): Post Debridement Measurements of Total Wound Length: (cm) 0.4 Width: (cm) 0.1 Depth: (cm) 0.1 Volume: (cm) 0.003 Character of Wound/Ulcer Post Debridement: Improved Post Procedure Diagnosis Same as Pre-procedure Electronic Signature(s) Signed: 03/29/2022 12:24:33 PM By: Fredirick Maudlin MD FACS Signed: 03/29/2022 5:25:14 PM By: Dellie Catholic RN Entered By: Dellie Catholic on 03/29/2022 11:30:41 -------------------------------------------------------------------------------- Debridement Details Patient Name: Date of Service: Jacob Katos R. 03/29/2022 11:00 A M Medical Record Number: [id number] Patient Account Number: [id number] Date of Birth/Sex: Treating RN: 1951-01-26 (71 y.o. [name] Primary Care Provider: Alain Honey Other Clinician: Referring Provider: Treating Provider/Extender: Neil Crouch in Treatment: 4 Debridement Performed for Assessment: Wound #8 Right,Plantar Foot Performed By: Physician Fredirick Maudlin, MD Debridement Type: Debridement Severity of Tissue Pre Debridement: Fat layer exposed Level of Consciousness (Pre-procedure): Awake and Alert Pre-procedure Verification/Time Out Yes - 11:26 Taken: Start Time: 11:26 Pain Control: Other : Benzocaine 20% T Area Debrided (L x W): otal 0.4 (cm) x 0.4 (cm) = 0.16 (cm) Tissue and other material debrided: Non-Viable, Callus, Eschar, Subcutaneous Level: Skin/Subcutaneous Tissue Debridement Description: Excisional Instrument: Curette Bleeding: Minimum Hemostasis Achieved: Pressure End Time: 11:27 Procedural Pain: 0 Post Procedural Pain: 0 Response to Treatment: Procedure was tolerated well Level of Consciousness (Post- Awake and Alert procedure): Post Debridement Measurements of Total Wound Length: (cm) 0.4 Width: (cm) 0.4 Depth: (cm) 0.2 Volume: (cm) 0.025 Character of Wound/Ulcer Post Debridement: Improved Severity of Tissue Post  Debridement: Fat layer exposed Post Procedure Diagnosis Same as Pre-procedure Electronic Signature(s) Signed: 03/29/2022 12:24:33 PM By: Fredirick Maudlin MD FACS Signed: 03/29/2022 5:25:14 PM By: Dellie Catholic RN Entered By: Dellie Catholic on 03/29/2022 11:31:42 -------------------------------------------------------------------------------- HPI Details Patient Name: Date of Service: Jacob Katos R. 03/29/2022 11:00 A M Medical Record Number: [id number] Patient Account Number: [id number] Date of Birth/Sex: Treating RN: August 06, 1951 (71 y.o. [name] Primary Care Provider: Alain Honey Other Clinician: Referring Provider: Treating Provider/Extender: Neil Crouch in  Treatment: 4 History of Present Illness Location: 2 ulcers noted to the left hallux Quality: the patient denies pain Duration: these were originally blisters towards the end of February and opened in early March Context: these wounds are secondary to pressure; he got new shoes in February and also started wearing a toe cover/sleeve Modifying Factors: pressure and friction are exacerbating factor HPI Description: 01/12/17- [name] arrives for initial evaluation of 2 ulcers to his left hallux. He states that back in February he did receive a new pair of diabetic shoes and at that time he started wearing toe cover/sleeves and noticed development of a blister to the dorsal and plantar aspect. He has been seen at [facility] by both Dr. Amalia Hailey and Dr. Jacqualyn Posey. He last saw Dr.Wagoner on 3/8 at which time he was prescribed Silvadene and Keflex. He completed that dose of Keflex and states that he got a phone call to extend the Keflex prior to this appointment. He has a follow-up with Dr. Amalia Hailey on 3/26. He has been wearing a surgical shoe, no significant offloading to the plantar/medial ulcer but the strap does not extend over the dorsal ulcer. Topically he has been applying the Silvadene  daily and a dry dressing. He is a diabetic, on insulin, with a recent A1c of 6.2% (11/10/16). He is a current smoker, 0.5 PPD. He has no known diagnosis of arterial insufficiency. 01/20/17- The patient arrives for follow up evaluation of his ulcers to the left hallux. He admits to continuing to smoke approximately 0.5ppd. He states that his diet has been somewhat uncontrolled with higher than normal glucose levels, this seems circumstantial as he had a death in the family as food and schedule has been disrupted. he admits to wearing the surgical show with offloading modifications while out of the home, but has been ambulating barefoot around the home. he saw Dr. Amalia Hailey yesterday for toenail trimming and callus shaving to the right plantar foot and investigation of his ulcers. 01/26/17- he is here for follow-up evaluation of his left hallux ulcers. He states he has been significantly more compliant in the use of his offloading surgical shoe, with the exception of "getting up for a glass of water" he states that he has worn it with any ambulation. He states he has decreased his smoking too, admitting to only a cigarette since yesterday. He states his blood sugar this morning was 147 and yesterday was 116. He is voicing no complaints or concerns 02/02/17- He is here for follow-up evaluation of his left hallux ulcers. The dorsal ulcer is crusted over/healed. He states he has minimized his ambulation over this past week, much less than the previous week. He states his glucose levels have been between 120-150 about half of the time, otherwise greater than 150. He cannot correlate any specific dietary changes. He does have an appointment with a dietitian next month. He was advised to keep a log of his glucose levels and his diet for that appointment. 02/06/17; patient comes in today for first application of the total contact cast. Her intake nurse reported the wounds look better therefore I did not actually  see these before application of the cast. He'll return on Thursday for reapplication in the standard fashion 02/10/27- he is here for follow-up evaluation of his left plantar hallux ulcer. He came in Monday for application of a total contact cast. He states that he was tolerating that Monday, but noticed on Tuesday that his foot began to slide in the cast.  He also is complaining of the heaviness of the cast, as he ambulates as his primary form of transportation. He continues to smoke,. He has not contacted his cardiologist regarding Nicorette gum. He states his blood sugars have been slightly elevated, this morning being 206. He states that he will complete an antibiotic tonight. He states this was prescribed by Dr. Jacqualyn Posey. He states that he has refilled this prescription twice, without direction to continue taking this medication. 02/16/17; patient wound about the same as last week per her description. Rolled edges of callus scan and nonviable subcutaneous tissue. Using Prisma. Apparently used topical antibiotic instead of the Prisma over the course of this week. He has not a total contact cast candidate secondary to ambulation needs and unsteadiness on his feet per discussion with our intake nurse 03/02/17 patient requires debridement. He has a small wound on the right great toe which probes however does not approach bone. What I can see of the base of this wound looks reasonably healthy and I think this is largely in offloading issue with the second toe crossing over the first. He did not tolerate a total contact cast has balance issues 5/171/8. 0.2x0.2x0.2 less depth. Using prisma 03/16/17; no major change. Still a small probing wound with copious amounts of surrounding callus skin nonviable subcutaneous tissue all of which debrided. Still using Prisma. I suspect the dimensions post debridement are about the same as listed above 0.2 x 0.2 x 0.2 03/23/17; no major change using Prisma. Once again a  large amount of callus nonviable subcutaneous tissue around the wound. I went over the offloading issues with the patient and the nurse. He did not tolerate a total contact cast he uses public transportation among other issues. 03/30/17; no major change using Prisma. Once again a large amount of callus and nonviable tissue around the wound requires debridement. Saw Dr. Amalia Hailey who pared calluses on the other foot. 04/06/17; the wound is much smaller there is callus around this although I think I can see the base of the wound and I elected not to do any debridement. 04/13/17 this is a wound I did not debridement last week left callus intact. He arrives today with the area much in the same predicament however this time I remove the callus to find a deep probing hole. This is disappointing this is not go to bone. We have been using Prisma. 04/20/17 plantar left first toe. Again although a lot of circumferential callus around the wound which requires debridement however this does not appear to be a probing area. Culture I did last week show coag-negative staph. I thought this was probably not something that was worth treating but In the interim he was seen by his primary care with parotitis. He is now on clindamycin and Cipro and the Staphylococcus we cultured is Cipro sensitive [lugdunensis} 04/27/17; plantar left first toe. Again a lot of circumferential callus around a small open area. Removing the callus also nonviable subcutaneous tissue. The wound looks better today. He has completed his antibiotics for the parotitis. I didn't think additional antibiotics were necessary in terms of the wound 05/11/17; plantar first toe. I thought this might be closed this week however once again he has a deep probing small hole. Using pickups and a scalpel debridement of thick callus and subcutaneous tissue from around the wound reveals a depth of about 0.4 cm. We have not been able to offload the area. There is no obvious  infection here. No evidence of ischemia 05/18/17;  since his last visit he's been in hospital for oral surgery apparently to remove a retained surgical screw. He has swelling of his jaw and has some discomfort. He arrives today with the plantar left first toe in the same predicament. No major change. Each time he comes in here he has thick adherent callus 05/25/17; arrives today with roughly the same condition of the plantar left great toe. Perhaps somewhat better less callus. There are no options for offloading. He is on his feet a lot and I think this is largely a pressure issue X-ray I did last week showed no evidence of osteomyelitis. He has a toe separator as the great toe tends to ride under the second toe when he is walking. I'm hopeful that this will allow for a little less pressure on the plantar aspect of the left great toe 06/07/2017 -- he is working hard on giving up smoking and other than that has been wearing his diabetic shoes and is also trying to offload as much as possible. 06/15/17; patient arrived in clinic today with a difficult area on his left medial great toe closed over. There is still amount of callus on this however I don't believe there is any underlying open wound. He has custom shoes with inserts. I still think he is going to need to offload this area going forward. He also follows with Dr. Amalia Hailey of podiatry for routine foot care READMISSION 11/01/16; patient arrives in clinic today with a five-day history of a new area on the previously problematic left great toe. He is wearing his diabetic shoes and he apparently has a new pair coming. The patient is a type II diabetic with neuropathy. He follows with Dr. Amalia Hailey of podiatry for pre-ulcerative callus on his bilateral feet last seen on 08/24/17. He has developed a large blister on top of his left great toe this is since gone down somewhat. He is not been dressing this specifically. He does not have a history of significant PAD  previous ABIs in this clinic were1.1 11/06/17; left great toe has epithelialized since last time. I think this was caused by a insoles an issue pushing the toe against the dorsal aspect of the shoe itself has new diabetic shoes coming hopefully will have more forefoot depth. READMISSION 03/01/2022 This is a now 71 year old man with a past medical history significant for type 2 diabetes mellitus with end-stage renal disease on hemodialysis as as well as congestive heart failure, ischemic cardiomyopathy, hypertension, and prior diabetic foot ulcers. He has had multiple issues with his hemodialysis access. He had a prior left radiocephalic fistula that failed. In October 2020, he had a left brachiobasilic fistula. He apparently underwent a procedure at an outside vascular office on November 09, 2021. Subsequent to that, he developed a wound on his left posterior forearm just distal to the elbow as well as wounds on 2 of his left digits. He was diagnosed with steal syndrome and recently underwent ligation of his fistula with placement of a hemodialysis catheter. He is here today for assistance in healing the wounds that developed related to steal syndrome. He says that he has been applying Neosporin to the sites and they are fairly sensitive to the touch. 03/08/2022: The wounds on his fingers are much smaller and have just a little bit of accumulated thin eschar. The wound on his left posterior forearm is also smaller but has accumulated both slough and eschar. He also has a thick callus on his right fifth metatarsal head  plantar surface and unfortunately, it appears that there is an open wound underneath the callus. We have been using Iodosorb with Hydrofera Blue to all of his open wounds. Pain is much improved from last week. 03/15/2022: The wounds on his fingers have healed. The wound on his left posterior forearm is smaller and has a nice layer of granulation tissue with just a bit of accumulated slough.  He has built up additional callus around the right fifth metatarsal head plantar surface, but the underlying wound has a good surface with granulation tissue present. We have been using Iodosorb with Hydrofera Blue. He did not endorse any pain this week. 03/22/2022: The wound on his left posterior forearm continues to contract. There is good granulation tissue present and just a little bit of periwound eschar and overlying slough. The right fifth metatarsal head wound has once again accumulated some callus and there is slough within the wound. The intake nurse measured a area of undermining from 7-10 o'clock. 03/29/2022: The patient saw podiatry this week for nail trim and they also pared back a number of calluses. The wound on his left posterior forearm is substantially smaller with just some overlying thin eschar. The right fifth metatarsal head has a bit of callus accumulation as well as some slough in the wound bed but is otherwise clean. It is smaller today, as well. Electronic Signature(s) Signed: 03/29/2022 12:15:32 PM By: Fredirick Maudlin MD FACS Entered By: Fredirick Maudlin on 03/29/2022 12:15:31 -------------------------------------------------------------------------------- Physical Exam Details Patient Name: Date of Service: Jacob [name]. 03/29/2022 11:00 A M Medical Record Number: [id number] Patient Account Number: [id number] Date of Birth/Sex: Treating RN: 1951/04/16 (71 y.o. [name] Primary Care Provider: Alain Honey Other Clinician: Referring Provider: Treating Provider/Extender: Neil Crouch in Treatment: 4 Constitutional He is hypertensive, but asymptomatic.. . . . No acute distress. Respiratory Normal work of breathing on room air. Notes 03/29/2022: The wound on his left posterior forearm is substantially smaller with just some overlying thin eschar. The right fifth metatarsal head has a bit of callus accumulation as well as some  slough in the wound bed but is otherwise clean. It is smaller today, as well. Electronic Signature(s) Signed: 03/29/2022 12:18:49 PM By: Fredirick Maudlin MD FACS Entered By: Fredirick Maudlin on 03/29/2022 12:18:49 -------------------------------------------------------------------------------- Physician Orders Details Patient Name: Date of Service: Jacob [name]. 03/29/2022 11:00 A M Medical Record Number: [id number] Patient Account Number: [id number] Date of Birth/Sex: Treating RN: 02-Mar-1951 (70 y.o. [name] Primary Care Provider: Alain Honey Other Clinician: Referring Provider: Treating Provider/Extender: Neil Crouch in Treatment: 4 Verbal / Phone Orders: No Diagnosis Coding ICD-10 Coding Code Description L98.499 Non-pressure chronic ulcer of skin of other sites with unspecified severity E11.22 Type 2 diabetes mellitus with diabetic chronic kidney disease J44.9 Chronic obstructive pulmonary disease, unspecified I50.42 Chronic combined systolic (congestive) and diastolic (congestive) heart failure I10 Essential (primary) hypertension N25.81 Secondary hyperparathyroidism of renal origin N18.6 End stage renal disease B20 Human immunodeficiency virus [HIV] disease L97.512 Non-pressure chronic ulcer of other part of right foot with fat layer exposed Follow-up Appointments ppointment in 1 week. - Dr Celine Ahr Room 3 Tuesday June 13th at 12:30 pm Return A Bathing/ Shower/ Hygiene Other Bathing/Shower/Hygiene Orders/Instructions: - Change dressing after bathing Wound Treatment Wound #7 - Upper Arm Wound Laterality: Left, Posterior Cleanser: Soap and Water Every Other Day/30 Days Discharge Instructions: May shower and wash wound with dial antibacterial soap and water prior  to dressing change. Cleanser: Wound Cleanser (Generic) Every Other Day/30 Days Discharge Instructions: Cleanse the wound with wound cleanser prior to applying a clean dressing  using gauze sponges, not tissue or cotton balls. Prim Dressing: Hydrofera Blue Ready Foam, 2.5 x2.5 in (Generic) Every Other Day/30 Days ary Discharge Instructions: Apply to wound bed as instructed Secondary Dressing: Bordered Gauze, 4x4 in (Generic) Every Other Day/30 Days Discharge Instructions: Apply over primary dressing as directed. Wound #8 - Foot Wound Laterality: Plantar, Right Cleanser: Soap and Water Every Other Day/30 Days Discharge Instructions: May shower and wash wound with dial antibacterial soap and water prior to dressing change. Cleanser: Wound Cleanser (Generic) Every Other Day/30 Days Discharge Instructions: Cleanse the wound with wound cleanser prior to applying a clean dressing using gauze sponges, not tissue or cotton balls. Prim Dressing: Hydrofera Blue Ready Foam, 2.5 x2.5 in (Generic) Every Other Day/30 Days ary Discharge Instructions: Apply to wound bed as instructed Secondary Dressing: Optifoam Non-Adhesive Dressing, 4x4 in (Generic) Every Other Day/30 Days Discharge Instructions: Apply over primary dressing as directed. Secondary Dressing: Woven Gauze Sponges 2x2 in (Generic) Every Other Day/30 Days Discharge Instructions: Apply over primary dressing as directed. Secured With: Child psychotherapist, Sterile 2x75 (in/in) (Generic) Every Other Day/30 Days Discharge Instructions: Secure with stretch gauze as directed. Secured With: 31M Medipore Public affairs consultant Surgical T 2x10 (in/yd) (Generic) Every Other Day/30 Days ape Discharge Instructions: Secure with tape as directed. Electronic Signature(s) Signed: 03/29/2022 12:19:08 PM By: Fredirick Maudlin MD FACS Entered By: Fredirick Maudlin on 03/29/2022 12:19:08 -------------------------------------------------------------------------------- Problem List Details Patient Name: Date of Service: Jacob [name]. 03/29/2022 11:00 A M Medical Record Number: [id number] Patient Account Number: [id number] Date of  Birth/Sex: Treating RN: Jun 06, 1951 (71 y.o. [name] Primary Care Provider: Alain Honey Other Clinician: Referring Provider: Treating Provider/Extender: Neil Crouch in Treatment: 4 Active Problems ICD-10 Encounter Code Description Active Date MDM Diagnosis L98.499 Non-pressure chronic ulcer of skin of other sites with unspecified severity 03/01/2022 No Yes E11.22 Type 2 diabetes mellitus with diabetic chronic kidney disease 03/01/2022 No Yes J44.9 Chronic obstructive pulmonary disease, unspecified 03/01/2022 No Yes I50.42 Chronic combined systolic (congestive) and diastolic (congestive) heart failure 03/01/2022 No Yes I10 Essential (primary) hypertension 03/01/2022 No Yes N25.81 Secondary hyperparathyroidism of renal origin 03/01/2022 No Yes N18.6 End stage renal disease 03/01/2022 No Yes B20 Human immunodeficiency virus [HIV] disease 03/01/2022 No Yes L97.512 Non-pressure chronic ulcer of other part of right foot with fat layer exposed 03/08/2022 No Yes Inactive Problems Resolved Problems Electronic Signature(s) Signed: 03/29/2022 12:10:07 PM By: Fredirick Maudlin MD FACS Entered By: Fredirick Maudlin on 03/29/2022 12:10:07 -------------------------------------------------------------------------------- Progress Note Details Patient Name: Date of Service: Jacob Katos R. 03/29/2022 11:00 A M Medical Record Number: [id number] Patient Account Number: [id number] Date of Birth/Sex: Treating RN: 08-Nov-1950 (71 y.o. [name] Primary Care Provider: Alain Honey Other Clinician: Referring Provider: Treating Provider/Extender: Neil Crouch in Treatment: 4 Subjective Chief Complaint Information obtained from Patient Patient seen for complaints of Non-Healing Wounds x 3 History of Present Illness (HPI) The following HPI elements were documented for the patient's wound: Location: 2 ulcers noted to the left  hallux Quality: the patient denies pain Duration: these were originally blisters towards the end of February and opened in early March Context: these wounds are secondary to pressure; he got new shoes in February and also started wearing a toe cover/sleeve Modifying Factors: pressure and friction are exacerbating factor  01/12/17- Mr. Blagg arrives for initial evaluation of 2 ulcers to his left hallux. He states that back in February he did receive a new pair of diabetic shoes and at that time he started wearing toe cover/sleeves and noticed development of a blister to the dorsal and plantar aspect. He has been seen at [facility] by both Dr. Amalia Hailey and Dr. Jacqualyn Posey. He last saw Dr.Wagoner on 3/8 at which time he was prescribed Silvadene and Keflex. He completed that dose of Keflex and states that he got a phone call to extend the Keflex prior to this appointment. He has a follow-up with Dr. Amalia Hailey on 3/26. He has been wearing a surgical shoe, no significant offloading to the plantar/medial ulcer but the strap does not extend over the dorsal ulcer. T opically he has been applying the Silvadene daily and a dry dressing. He is a diabetic, on insulin, with a recent A1c of 6.2% (11/10/16). He is a current smoker, 0.5 PPD. He has no known diagnosis of arterial insufficiency. 01/20/17- The patient arrives for follow up evaluation of his ulcers to the left hallux. He admits to continuing to smoke approximately 0.5ppd. He states that his diet has been somewhat uncontrolled with higher than normal glucose levels, this seems circumstantial as he had a death in the family as food and schedule has been disrupted. he admits to wearing the surgical show with offloading modifications while out of the home, but has been ambulating barefoot around the home. he saw Dr. Amalia Hailey yesterday for toenail trimming and callus shaving to the right plantar foot and investigation of his ulcers. 01/26/17- he is here for follow-up  evaluation of his left hallux ulcers. He states he has been significantly more compliant in the use of his offloading surgical shoe, with the exception of "getting up for a glass of water" he states that he has worn it with any ambulation. He states he has decreased his smoking too, admitting to only a cigarette since yesterday. He states his blood sugar this morning was 147 and yesterday was 116. He is voicing no complaints or concerns 02/02/17- He is here for follow-up evaluation of his left hallux ulcers. The dorsal ulcer is crusted over/healed. He states he has minimized his ambulation over this past week, much less than the previous week. He states his glucose levels have been between 120-150 about half of the time, otherwise greater than 150. He cannot correlate any specific dietary changes. He does have an appointment with a dietitian next month. He was advised to keep a log of his glucose levels and his diet for that appointment. 02/06/17; patient comes in today for first application of the total contact cast. Her intake nurse reported the wounds look better therefore I did not actually see these before application of the cast. He'll return on Thursday for reapplication in the standard fashion 02/10/27- he is here for follow-up evaluation of his left plantar hallux ulcer. He came in Monday for application of a total contact cast. He states that he was tolerating that Monday, but noticed on Tuesday that his foot began to slide in the cast. He also is complaining of the heaviness of the cast, as he ambulates as his primary form of transportation. He continues to smoke,. He has not contacted his cardiologist regarding Nicorette gum. He states his blood sugars have been slightly elevated, this morning being 206. He states that he will complete an antibiotic tonight. He states this was prescribed by Dr. Jacqualyn Posey. He states  that he has refilled this prescription twice, without direction to continue taking  this medication. 02/16/17; patient wound about the same as last week per her description. Rolled edges of callus scan and nonviable subcutaneous tissue. Using Prisma. Apparently used topical antibiotic instead of the Prisma over the course of this week. He has not a total contact cast candidate secondary to ambulation needs and unsteadiness on his feet per discussion with our intake nurse 03/02/17 patient requires debridement. He has a small wound on the right great toe which probes however does not approach bone. What I can see of the base of this wound looks reasonably healthy and I think this is largely in offloading issue with the second toe crossing over the first. He did not tolerate a total contact cast has balance issues 5/171/8. 0.2x0.2x0.2 less depth. Using prisma 03/16/17; no major change. Still a small probing wound with copious amounts of surrounding callus skin nonviable subcutaneous tissue all of which debrided. Still using Prisma. I suspect the dimensions post debridement are about the same as listed above 0.2 x 0.2 x 0.2 03/23/17; no major change using Prisma. Once again a large amount of callus nonviable subcutaneous tissue around the wound. I went over the offloading issues with the patient and the nurse. He did not tolerate a total contact cast he uses public transportation among other issues. 03/30/17; no major change using Prisma. Once again a large amount of callus and nonviable tissue around the wound requires debridement. Saw Dr. Amalia Hailey who pared calluses on the other foot. 04/06/17; the wound is much smaller there is callus around this although I think I can see the base of the wound and I elected not to do any debridement. 04/13/17 this is a wound I did not debridement last week left callus intact. He arrives today with the area much in the same predicament however this time I remove the callus to find a deep probing hole. This is disappointing this is not go to bone. We have been  using Prisma. 04/20/17 plantar left first toe. Again although a lot of circumferential callus around the wound which requires debridement however this does not appear to be a probing area. Culture I did last week show coag-negative staph. I thought this was probably not something that was worth treating but In the interim he was seen by his primary care with parotitis. He is now on clindamycin and Cipro and the Staphylococcus we cultured is Cipro sensitive [lugdunensis} 04/27/17; plantar left first toe. Again a lot of circumferential callus around a small open area. Removing the callus also nonviable subcutaneous tissue. The wound looks better today. He has completed his antibiotics for the parotitis. I didn't think additional antibiotics were necessary in terms of the wound 05/11/17; plantar first toe. I thought this might be closed this week however once again he has a deep probing small hole. Using pickups and a scalpel debridement of thick callus and subcutaneous tissue from around the wound reveals a depth of about 0.4 cm. We have not been able to offload the area. There is no obvious infection here. No evidence of ischemia 05/18/17; since his last visit he's been in hospital for oral surgery apparently to remove a retained surgical screw. He has swelling of his jaw and has some discomfort. He arrives today with the plantar left first toe in the same predicament. No major change. Each time he comes in here he has thick adherent callus 05/25/17; arrives today with roughly the same condition of  the plantar left great toe. Perhaps somewhat better less callus. There are no options for offloading. He is on his feet a lot and I think this is largely a pressure issue X-ray I did last week showed no evidence of osteomyelitis. He has a toe separator as the great toe tends to ride under the second toe when he is walking. I'm hopeful that this will allow for a little less pressure on the plantar aspect of the left  great toe 06/07/2017 -- he is working hard on giving up smoking and other than that has been wearing his diabetic shoes and is also trying to offload as much as possible. 06/15/17; patient arrived in clinic today with a difficult area on his left medial great toe closed over. There is still amount of callus on this however I don't believe there is any underlying open wound. He has custom shoes with inserts. I still think he is going to need to offload this area going forward. He also follows with Dr. Amalia Hailey of podiatry for routine foot care READMISSION 11/01/16; patient arrives in clinic today with a five-day history of a new area on the previously problematic left great toe. He is wearing his diabetic shoes and he apparently has a new pair coming. The patient is a type II diabetic with neuropathy. He follows with Dr. Amalia Hailey of podiatry for pre-ulcerative callus on his bilateral feet last seen on 08/24/17. He has developed a large blister on top of his left great toe this is since gone down somewhat. He is not been dressing this specifically. He does not have a history of significant PAD previous ABIs in this clinic were1.1 11/06/17; left great toe has epithelialized since last time. I think this was caused by a insoles an issue pushing the toe against the dorsal aspect of the shoe itself has new diabetic shoes coming hopefully will have more forefoot depth. READMISSION 03/01/2022 This is a now 71 year old man with a past medical history significant for type 2 diabetes mellitus with end-stage renal disease on hemodialysis as as well as congestive heart failure, ischemic cardiomyopathy, hypertension, and prior diabetic foot ulcers. He has had multiple issues with his hemodialysis access. He had a prior left radiocephalic fistula that failed. In October 2020, he had a left brachiobasilic fistula. He apparently underwent a procedure at an outside vascular office on November 09, 2021. Subsequent to that, he  developed a wound on his left posterior forearm just distal to the elbow as well as wounds on 2 of his left digits. He was diagnosed with steal syndrome and recently underwent ligation of his fistula with placement of a hemodialysis catheter. He is here today for assistance in healing the wounds that developed related to steal syndrome. He says that he has been applying Neosporin to the sites and they are fairly sensitive to the touch. 03/08/2022: The wounds on his fingers are much smaller and have just a little bit of accumulated thin eschar. The wound on his left posterior forearm is also smaller but has accumulated both slough and eschar. He also has a thick callus on his right fifth metatarsal head plantar surface and unfortunately, it appears that there is an open wound underneath the callus. We have been using Iodosorb with Hydrofera Blue to all of his open wounds. Pain is much improved from last week. 03/15/2022: The wounds on his fingers have healed. The wound on his left posterior forearm is smaller and has a nice layer of granulation tissue with just  a bit of accumulated slough. He has built up additional callus around the right fifth metatarsal head plantar surface, but the underlying wound has a good surface with granulation tissue present. We have been using Iodosorb with Hydrofera Blue. He did not endorse any pain this week. 03/22/2022: The wound on his left posterior forearm continues to contract. There is good granulation tissue present and just a little bit of periwound eschar and overlying slough. The right fifth metatarsal head wound has once again accumulated some callus and there is slough within the wound. The intake nurse measured a area of undermining from 7-10 o'clock. 03/29/2022: The patient saw podiatry this week for nail trim and they also pared back a number of calluses. The wound on his left posterior forearm is substantially smaller with just some overlying thin eschar. The  right fifth metatarsal head has a bit of callus accumulation as well as some slough in the wound bed but is otherwise clean. It is smaller today, as well. Patient History Information obtained from Patient. Family History Diabetes - Siblings,Father, Heart Disease - Father,Siblings, Hypertension - Father,Siblings, No family history of Cancer, Hereditary Spherocytosis, Kidney Disease, Lung Disease, Seizures, Stroke, Thyroid Problems, Tuberculosis. Social History Current every day smoker - 1/2 ppd / 58yr, Marital Status - Single, Alcohol Use - Moderate - 6 standard drinks per week, Drug Use - Prior History - Polysubstance Abuse; clean for 9 years, Caffeine Use - Rarely - tea. Medical History Eyes Patient has history of Cataracts - mild, Glaucoma Denies history of Optic Neuritis Ear/Nose/Mouth/Throat Denies history of Chronic sinus problems/congestion, Middle ear problems Hematologic/Lymphatic Patient has history of Anemia Denies history of Hemophilia, Human Immunodeficiency Virus, Lymphedema, Sickle Cell Disease Respiratory Patient has history of Chronic Obstructive Pulmonary Disease (COPD) Denies history of Aspiration, Asthma, Pneumothorax, Sleep Apnea, Tuberculosis Cardiovascular Patient has history of Congestive Heart Failure, Coronary Artery Disease, Hypertension, Myocardial Infarction - 2004 Denies history of Angina, Arrhythmia, Deep Vein Thrombosis, Hypotension, Peripheral Arterial Disease, Peripheral Venous Disease, Phlebitis, Vasculitis Gastrointestinal Patient has history of Hepatitis C - 1968 Denies history of Cirrhosis , Colitis, Crohnoos, Hepatitis A, Hepatitis B Endocrine Patient has history of Type II Diabetes Denies history of Type I Diabetes Genitourinary Denies history of End Stage Renal Disease Immunological Denies history of Lupus Erythematosus, Raynaudoos, Scleroderma Integumentary (Skin) Denies history of History of Burn Musculoskeletal Patient has history of  Gout, Osteoarthritis Denies history of Rheumatoid Arthritis, Osteomyelitis Neurologic Patient has history of Neuropathy Denies history of Dementia, Quadriplegia, Paraplegia, Seizure Disorder Oncologic Denies history of Received Chemotherapy, Received Radiation Psychiatric Denies history of Anorexia/bulimia, Confinement Anxiety Hospitalization/Surgery History - Colonoscopy. - CABG. - Laproscopic Cholecystectomy. - Left Hip Fx. - Left Jaw Fx. - Lumbar Laminectomy. Medical A Surgical History Notes nd Constitutional Symptoms (General Health) Insomnia Syncope Alcohol Dependence HIV Hematologic/Lymphatic Hyperlipidemia Hyperglycemia Cardiovascular CABG, ischemic cardiomyopathy Gastrointestinal GERD Genitourinary Syphilis CKD,Nocturia Immunological HIV Musculoskeletal Hip Fx BiLateral Bunions Unspecified Arthritis Neurologic chronic back pain Objective Constitutional He is hypertensive, but asymptomatic..Marland KitchenNo acute distress. Vitals Time Taken: 10:51 AM, Height: 74 in, Weight: 172 lbs, BMI: 22.1, Temperature: 97.9 F, Pulse: 69 bpm, Respiratory Rate: 18 breaths/min, Blood Pressure: 163/77 mmHg. Respiratory Normal work of breathing on room air. General Notes: 03/29/2022: The wound on his left posterior forearm is substantially smaller with just some overlying thin eschar. The right fifth metatarsal head has a bit of callus accumulation as well as some slough in the wound bed but is otherwise clean. It is smaller today, as well. Integumentary (Hair,  Skin) Wound #7 status is Open. Original cause of wound was Blister. The date acquired was: 11/09/2021. The wound has been in treatment 4 weeks. The wound is located on the Left,Posterior Upper Arm. The wound measures 0.4cm length x 0.1cm width x 0.1cm depth; 0.031cm^2 area and 0.003cm^3 volume. There is Fat Layer (Subcutaneous Tissue) exposed. There is no tunneling or undermining noted. There is a medium amount of serosanguineous drainage  noted. The wound margin is flat and intact. There is medium (34-66%) red granulation within the wound bed. There is a medium (34-66%) amount of necrotic tissue within the wound bed including Eschar and Adherent Slough. Wound #8 status is Open. Original cause of wound was Blister. The date acquired was: 03/08/2022. The wound has been in treatment 3 weeks. The wound is located on the [place]. The wound measures 0.4cm length x 0.4cm width x 0.2cm depth; 0.126cm^2 area and 0.025cm^3 volume. There is Fat Layer (Subcutaneous Tissue) exposed. There is no tunneling or undermining noted. There is a medium amount of serosanguineous drainage noted. The wound margin is thickened. There is large (67-100%) red granulation within the wound bed. There is a small (1-33%) amount of necrotic tissue within the wound bed including Adherent Slough. Assessment Active Problems ICD-10 Non-pressure chronic ulcer of skin of other sites with unspecified severity Type 2 diabetes mellitus with diabetic chronic kidney disease Chronic obstructive pulmonary disease, unspecified Chronic combined systolic (congestive) and diastolic (congestive) heart failure Essential (primary) hypertension Secondary hyperparathyroidism of renal origin End stage renal disease Human immunodeficiency virus [HIV] disease Non-pressure chronic ulcer of other part of right foot with fat layer exposed Procedures Wound #7 Pre-procedure diagnosis of Wound #7 is an Abrasion located on the Left,Posterior Upper Arm . There was a Selective/Open Wound Non-Viable Tissue Debridement with a total area of 0.04 sq cm performed by Fredirick Maudlin, MD. With the following instrument(s): Curette to remove Non-Viable tissue/material. Material removed includes Eschar and Slough and after achieving pain control using Other (Benzocaine 20%). No specimens were taken. A time out was conducted at 11:26, prior to the start of the procedure. A Minimum amount of  bleeding was controlled with Pressure. The procedure was tolerated well with a pain level of 0 throughout and a pain level of 0 following the procedure. Post Debridement Measurements: 0.4cm length x 0.1cm width x 0.1cm depth; 0.003cm^3 volume. Character of Wound/Ulcer Post Debridement is improved. Post procedure Diagnosis Wound #7: Same as Pre-Procedure Wound #8 Pre-procedure diagnosis of Wound #8 is a Diabetic Wound/Ulcer of the Lower Extremity located on the Right,Plantar Foot .Severity of Tissue Pre Debridement is: Fat layer exposed. There was a Excisional Skin/Subcutaneous Tissue Debridement with a total area of 0.16 sq cm performed by Fredirick Maudlin, MD. With the following instrument(s): Curette to remove Non-Viable tissue/material. Material removed includes Eschar, Callus, and Subcutaneous Tissue after achieving pain control using Other (Benzocaine 20%). No specimens were taken. A time out was conducted at 11:26, prior to the start of the procedure. A Minimum amount of bleeding was controlled with Pressure. The procedure was tolerated well with a pain level of 0 throughout and a pain level of 0 following the procedure. Post Debridement Measurements: 0.4cm length x 0.4cm width x 0.2cm depth; 0.025cm^3 volume. Character of Wound/Ulcer Post Debridement is improved. Severity of Tissue Post Debridement is: Fat layer exposed. Post procedure Diagnosis Wound #8: Same as Pre-Procedure Plan Follow-up Appointments: Return Appointment in 1 week. - Dr Celine Ahr Room 3 Tuesday June 13th at 12:30 pm Bathing/ Shower/ Hygiene:  Other Bathing/Shower/Hygiene Orders/Instructions: - Change dressing after bathing WOUND #7: - Upper Arm Wound Laterality: Left, Posterior Cleanser: Soap and Water Every Other Day/30 Days Discharge Instructions: May shower and wash wound with dial antibacterial soap and water prior to dressing change. Cleanser: Wound Cleanser (Generic) Every Other Day/30 Days Discharge Instructions:  Cleanse the wound with wound cleanser prior to applying a clean dressing using gauze sponges, not tissue or cotton balls. Prim Dressing: Hydrofera Blue Ready Foam, 2.5 x2.5 in (Generic) Every Other Day/30 Days ary Discharge Instructions: Apply to wound bed as instructed Secondary Dressing: Bordered Gauze, 4x4 in (Generic) Every Other Day/30 Days Discharge Instructions: Apply over primary dressing as directed. WOUND #8: - Foot Wound Laterality: Plantar, Right Cleanser: Soap and Water Every Other Day/30 Days Discharge Instructions: May shower and wash wound with dial antibacterial soap and water prior to dressing change. Cleanser: Wound Cleanser (Generic) Every Other Day/30 Days Discharge Instructions: Cleanse the wound with wound cleanser prior to applying a clean dressing using gauze sponges, not tissue or cotton balls. Prim Dressing: Hydrofera Blue Ready Foam, 2.5 x2.5 in (Generic) Every Other Day/30 Days ary Discharge Instructions: Apply to wound bed as instructed Secondary Dressing: Optifoam Non-Adhesive Dressing, 4x4 in (Generic) Every Other Day/30 Days Discharge Instructions: Apply over primary dressing as directed. Secondary Dressing: Woven Gauze Sponges 2x2 in (Generic) Every Other Day/30 Days Discharge Instructions: Apply over primary dressing as directed. Secured With: Child psychotherapist, Sterile 2x75 (in/in) (Generic) Every Other Day/30 Days Discharge Instructions: Secure with stretch gauze as directed. Secured With: 55M Medipore Public affairs consultant Surgical T 2x10 (in/yd) (Generic) Every Other Day/30 Days ape Discharge Instructions: Secure with tape as directed. 03/29/2022: The wound on his left posterior forearm is substantially smaller with just some overlying thin eschar. The right fifth metatarsal head has a bit of callus accumulation as well as some slough in the wound bed but is otherwise clean. It is smaller today, as well. I used a curette to debride the eschar from his  forearm wound and callus, slough, and subcutaneous tissue from his metatarsal plantar wound. We will continue using Hydrofera Blue ready. Follow-up in 1 week. Electronic Signature(s) Signed: 03/29/2022 12:19:55 PM By: Fredirick Maudlin MD FACS Entered By: Fredirick Maudlin on 03/29/2022 12:19:55 -------------------------------------------------------------------------------- HxROS Details Patient Name: Date of Service: Jacob Katos R. 03/29/2022 11:00 A M Medical Record Number: [id number] Patient Account Number: [id number] Date of Birth/Sex: Treating RN: [birth year] (71 y.o. [name] Primary Care Provider: Alain Honey Other Clinician: Referring Provider: Treating Provider/Extender: Neil Crouch in Treatment: 4 Information Obtained From Patient Constitutional Symptoms (General Health) Medical History: Past Medical History Notes: Insomnia Syncope Alcohol Dependence HIV Eyes Medical History: Positive for: Cataracts - mild; Glaucoma Negative for: Optic Neuritis Ear/Nose/Mouth/Throat Medical History: Negative for: Chronic sinus problems/congestion; Middle ear problems Hematologic/Lymphatic Medical History: Positive for: Anemia Negative for: Hemophilia; Human Immunodeficiency Virus; Lymphedema; Sickle Cell Disease Past Medical History Notes: Hyperlipidemia Hyperglycemia Respiratory Medical History: Positive for: Chronic Obstructive Pulmonary Disease (COPD) Negative for: Aspiration; Asthma; Pneumothorax; Sleep Apnea; Tuberculosis Cardiovascular Medical History: Positive for: Congestive Heart Failure; Coronary Artery Disease; Hypertension; Myocardial Infarction - 2004 Negative for: Angina; Arrhythmia; Deep Vein Thrombosis; Hypotension; Peripheral Arterial Disease; Peripheral Venous Disease; Phlebitis; Vasculitis Past Medical History Notes: CABG, ischemic cardiomyopathy Gastrointestinal Medical History: Positive for: Hepatitis C -  1968 Negative for: Cirrhosis ; Colitis; Crohns; Hepatitis A; Hepatitis B Past Medical History Notes: GERD Endocrine Medical History: Positive for: Type II Diabetes Negative for: Type  I Diabetes Time with diabetes: 13 yrs Treated with: Insulin Blood sugar tested every day: Yes Tested : 2 times per day Blood sugar testing results: Breakfast: 100; Dinner: 95-126 Genitourinary Medical History: Negative for: End Stage Renal Disease Past Medical History Notes: Syphilis CKD,Nocturia Immunological Medical History: Negative for: Lupus Erythematosus; Raynauds; Scleroderma Past Medical History Notes: HIV Integumentary (Skin) Medical History: Negative for: History of Burn Musculoskeletal Medical History: Positive for: Gout; Osteoarthritis Negative for: Rheumatoid Arthritis; Osteomyelitis Past Medical History Notes: Hip Fx BiLateral Bunions Unspecified Arthritis Neurologic Medical History: Positive for: Neuropathy Negative for: Dementia; Quadriplegia; Paraplegia; Seizure Disorder Past Medical History Notes: chronic back pain Oncologic Medical History: Negative for: Received Chemotherapy; Received Radiation Psychiatric Medical History: Negative for: Anorexia/bulimia; Confinement Anxiety HBO Extended History Items Eyes: Eyes: Cataracts Glaucoma Immunizations Pneumococcal Vaccine: Received Pneumococcal Vaccination: Yes Received Pneumococcal Vaccination On or After 60th Birthday: Yes Implantable Devices No devices added Hospitalization / Surgery History Type of Hospitalization/Surgery Colonoscopy CABG Laproscopic Cholecystectomy Left Hip Fx Left Jaw Fx Lumbar Laminectomy Family and Social History Cancer: No; Diabetes: Yes - Siblings,Father; Heart Disease: Yes - Father,Siblings; Hereditary Spherocytosis: No; Hypertension: Yes - Father,Siblings; Kidney Disease: No; Lung Disease: No; Seizures: No; Stroke: No; Thyroid Problems: No; Tuberculosis: No; Current every day  smoker - 1/2 ppd / 76yr; Marital Status - Single; Alcohol Use: Moderate - 6 standard drinks per week; Drug Use: Prior History - Polysubstance Abuse; clean for 9 years; Caffeine Use: Rarely - tea; Financial Concerns: No; Food, Clothing or Shelter Needs: No; Support System Lacking: No; Transportation Concerns: No Electronic Signature(s) Signed: 03/29/2022 12:24:33 PM By: CFredirick MaudlinMD FACS Signed: 03/29/2022 5:25:14 PM By: SDellie CatholicRN Entered By: CFredirick Maudlinon 03/29/2022 12:15:38 -------------------------------------------------------------------------------- SuperBill Details Patient Name: Date of Service: NRaechel Ache6/03/2022 Medical Record Number: [id number] Account Number: [id number] of Birth/Sex: Treating RN: [birth year](71y.o. MCollene GobblePrimary Care Provider: MAlain HoneyOther Clinician: Referring Provider: Treating Provider/Extender: CNeil Crouchin Treatment: 4 Diagnosis Coding ICD-10 Codes Code Description [phone]-pressure chronic ulcer of skin of other sites with unspecified severity E11.22 Type 2 diabetes mellitus with diabetic chronic kidney disease J44.9 Chronic obstructive pulmonary disease, unspecified I50.42 Chronic combined systolic (congestive) and diastolic (congestive) heart failure I10 Essential (primary) hypertension N25.81 Secondary hyperparathyroidism of renal origin N18.6 End stage renal disease B20 Human immunodeficiency virus [HIV] disease L97.512 Non-pressure chronic ulcer of other part of right foot with fat layer exposed Facility Procedures CPT4 Code: [id number]: 11042 - DEB SUBQ TISSUE 20 SQ CM/< ICD-10 Diagnosis Description L97.512 Non-pressure chronic ulcer of other part of right foot with fat layer exposed Modifier: Quantity: 1 CPT4 Code: [id number]: 906301- DEBRIDE WOUND 1ST 20 SQ CM OR < ICD-10 Diagnosis Description L98.499 Non-pressure chronic  ulcer of skin of other sites with unspecified severity Modifier: Quantity: 1 Physician Procedures : CPT4 Code Description Modifier [id number] 35573- WC PHYS LEVEL 3 - EST PT 25 ICD-10 Diagnosis Description L98.499 Non-pressure chronic ulcer of skin of other sites with unspecified severity L97.512 Non-pressure chronic ulcer of other part of right foot  with fat layer exposed 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

## 2022-03-29 NOTE — Progress Notes (Signed)
BRANDO, TAVES ([id number]) Visit Report for 03/29/2022 Arrival Information Details Patient Name: Date of Service: NIKODEM, LEADBETTER 03/29/2022 11:00 A M Medical Record Number: [id number] Patient Account Number: [id number] Date of Birth/Sex: Treating RN: January 25, 1951 (71 y.o. [name] Primary Care Rector Devonshire: Alain Honey Other Clinician: Referring Kevron Patella: Treating Kert Shackett/Extender: Neil Crouch in Treatment: 4 Visit Information History Since Last Visit Added or deleted any medications: No Patient Arrived: [name] Any new allergies or adverse reactions: No Arrival Time: 10:50 Had a fall or experienced change in No Accompanied By: self activities of daily living that may affect Transfer Assistance: None risk of falls: Patient Requires Transmission-Based Precautions: No Signs or symptoms of abuse/neglect since last visito No Patient Has Alerts: No Hospitalized since last visit: No Implantable device outside of the clinic excluding No cellular tissue based products placed in the center since last visit: Has Dressing in Place as Prescribed: Yes Pain Present Now: No Electronic Signature(s) Signed: 03/29/2022 5:25:14 PM By: Dellie Catholic RN Entered By: Dellie Catholic on 03/29/2022 10:51:54 -------------------------------------------------------------------------------- Encounter Discharge Information Details Patient Name: Date of Service: [name] 03/29/2022 11:00 A M Medical Record Number: [id number] Patient Account Number: [id number] Date of Birth/Sex: Treating RN: [birth year] (71 y.o. [name] Primary Care Delos Klich: Alain Honey Other Clinician: Referring Thaxton Pelley: Treating Django Nguyen/Extender: Neil Crouch in Treatment: 4 Encounter Discharge Information Items Post Procedure Vitals Discharge Condition: Stable Temperature (F): 97.6 Ambulatory Status: Cane Pulse (bpm): 69 Discharge Destination:  Home Respiratory Rate (breaths/min): 18 Transportation: Private Auto Blood Pressure (mmHg): 163/77 Accompanied By: self Schedule Follow-up Appointment: Yes Clinical Summary of Care: Patient Declined Electronic Signature(s) Signed: 03/29/2022 5:25:14 PM By: Dellie Catholic RN Entered By: Dellie Catholic on 03/29/2022 17:06:12 -------------------------------------------------------------------------------- Lower Extremity Assessment Details Patient Name: Date of Service: WEBER, MONNIER 03/29/2022 11:00 A M Medical Record Number: [id number] Patient Account Number: [id number] Date of Birth/Sex: Treating RN: October 01, 1951 (71 y.o. [name] Primary Care Coby Antrobus: Alain Honey Other Clinician: Referring Lalani Winkles: Treating Thurza Kwiecinski/Extender: Neil Crouch in Treatment: 4 Edema Assessment Assessed: [name]: No] [Right: No] E[Left: dema] [Right: :] Calf Left: Right: Point of Measurement: From Medial Instep 31 cm Ankle Left: Right: Point of Measurement: From Medial Instep 19 cm Electronic Signature(s) Signed: 03/29/2022 5:25:14 PM By: Dellie Catholic RN Entered By: Dellie Catholic on 03/29/2022 10:55:58 -------------------------------------------------------------------------------- Multi Wound Chart Details Patient Name: Date of Service: [name] 03/29/2022 11:00 A M Medical Record Number: [id number] Patient Account Number: [id number] Date of Birth/Sex: Treating RN: June 30, 1951 (71 y.o. [name] Primary Care Bayan Kushnir: Alain Honey Other Clinician: Referring Josclyn Rosales: Treating Areatha Kalata/Extender: Neil Crouch in Treatment: 4 Vital Signs Height(in): 14 Pulse(bpm): 81 Weight(lbs): 172 Blood Pressure(mmHg): 163/77 Body Mass Index(BMI): 22.1 Temperature(F): 97.9 Respiratory Rate(breaths/min): 18 Photos: [N/A:N/A] Left, Posterior Upper Arm Right, Plantar Foot N/A Wound Location: Blister Blister  N/A Wounding Event: Abrasion Diabetic Wound/Ulcer of the Lower N/A Primary Etiology: Extremity Cataracts, Glaucoma, Anemia, Chronic Cataracts, Glaucoma, Anemia, Chronic N/A Comorbid History: Obstructive Pulmonary Disease Obstructive Pulmonary Disease (COPD), Congestive Heart Failure, (COPD), Congestive Heart Failure, Coronary Artery Disease, Coronary Artery Disease, Hypertension, Myocardial Infarction, Hypertension, Myocardial Infarction, Hepatitis C, Type II Diabetes, Gout, Hepatitis C, Type II Diabetes, Gout, Osteoarthritis, Neuropathy Osteoarthritis, Neuropathy 11/09/2021 03/08/2022 N/A Date Acquired: 4 3 N/A Weeks of Treatment: Open Open N/A Wound Status: No No N/A Wound Recurrence: 0.4x0.1x0.1 0.4x0.4x0.2 N/A Measurements L x W  x D (cm) 0.031 0.126 N/A A (cm) : rea 0.003 0.025 N/A Volume (cm) : 99.10% 88.50% N/A % Reduction in A rea: 99.10% 92.40% N/A % Reduction in Volume: Full Thickness Without Exposed Grade 2 N/A Classification: Support Structures Medium Medium N/A Exudate A mount: Serosanguineous Serosanguineous N/A Exudate Type: red, brown red, brown N/A Exudate Color: Flat and Intact Thickened N/A Wound Margin: Medium (34-66%) Large (67-100%) N/A Granulation A mount: Red Red N/A Granulation Quality: Medium (34-66%) Small (1-33%) N/A Necrotic A mount: Eschar, Adherent Slough Adherent Slough N/A Necrotic Tissue: Fat Layer (Subcutaneous Tissue): Yes Fat Layer (Subcutaneous Tissue): Yes N/A Exposed Structures: Fascia: No Fascia: No Tendon: No Tendon: No Muscle: No Muscle: No Joint: No Joint: No Bone: No Bone: No Medium (34-66%) Small (1-33%) N/A Epithelialization: Debridement - Selective/Open Wound Debridement - Excisional N/A Debridement: Pre-procedure Verification/Time Out 11:26 11:26 N/A Taken: Other Other N/A Pain Control: Necrotic/Eschar, Slough Necrotic/Eschar, Callus, SubcutaneousN/A Tissue Debrided: Non-Viable Tissue  Skin/Subcutaneous Tissue N/A Level: 0.04 0.16 N/A Debridement A (sq cm): rea Curette Curette N/A Instrument: Minimum Minimum N/A Bleeding: Pressure Pressure N/A Hemostasis A chieved: 0 0 N/A Procedural Pain: 0 0 N/A Post Procedural Pain: Procedure was tolerated well Procedure was tolerated well N/A Debridement Treatment Response: 0.4x0.1x0.1 0.4x0.4x0.2 N/A Post Debridement Measurements L x W x D (cm) 0.003 0.025 N/A Post Debridement Volume: (cm) Debridement Debridement N/A Procedures Performed: Treatment Notes Electronic Signature(s) Signed: 03/29/2022 12:10:21 PM By: Fredirick Maudlin MD FACS Signed: 03/29/2022 5:25:14 PM By: Dellie Catholic RN Entered By: Fredirick Maudlin on 03/29/2022 12:10:21 -------------------------------------------------------------------------------- [place] Details Patient Name: Date of Service: [name] 03/29/2022 11:00 A M Medical Record Number: [id number] Patient Account Number: [id number] Date of Birth/Sex: Treating RN: 05-18-1951 (71 y.o. [name] Primary Care Sheriden Archibeque: Alain Honey Other Clinician: Referring Nura Cahoon: Treating Tytianna Greenley/Extender: Neil Crouch in Treatment: 4 Active Inactive Wound/Skin Impairment Nursing Diagnoses: Impaired tissue integrity Goals: Patient/caregiver will verbalize understanding of skin care regimen Date Initiated: 03/01/2022 Target Resolution Date: 04/22/2022 Goal Status: Active Interventions: Assess ulceration(s) every visit Treatment Activities: Skin care regimen initiated : 03/01/2022 Notes: Electronic Signature(s) Signed: 03/29/2022 5:25:14 PM By: Dellie Catholic RN Entered By: Dellie Catholic on 03/29/2022 17:04:27 -------------------------------------------------------------------------------- Pain Assessment Details Patient Name: Date of Service: [name] 03/29/2022 11:00 A M Medical Record Number: [id number] Patient  Account Number: [id number] Date of Birth/Sex: Treating RN: [birth year] (71 y.o. [name] Primary Care Treena Cosman: Alain Honey Other Clinician: Referring Nicle Connole: Treating Lorean Ekstrand/Extender: Neil Crouch in Treatment: 4 Active Problems Location of Pain Severity and Description of Pain Patient Has Paino No Site Locations Pain Management and Medication Current Pain Management: Electronic Signature(s) Signed: 03/29/2022 5:25:14 PM By: Dellie Catholic RN Entered By: Dellie Catholic on 03/29/2022 10:51:47 -------------------------------------------------------------------------------- Patient/Caregiver Education Details Patient Name: Date of Service: [name] 6/6/2023andnbsp11:00 A M Medical Record Number: [id number] Patient Account Number: [id number] Date of Birth/Gender: Treating RN: Feb 15, 1951 (71 y.o. [name] Primary Care Physician: Alain Honey Other Clinician: Referring Physician: Treating Physician/Extender: Neil Crouch in Treatment: 4 Education Assessment Education Provided To: Patient Education Topics Provided Wound/Skin Impairment: Methods: Explain/Verbal Responses: Return demonstration correctly Electronic Signature(s) Signed: 03/29/2022 5:25:14 PM By: Dellie Catholic RN Entered By: Dellie Catholic on 03/29/2022 17:04:42 -------------------------------------------------------------------------------- Wound Assessment Details Patient Name: Date of Service: [name] 03/29/2022 11:00 A M Medical Record Number: [id number] Patient Account Number: [id number] Date of Birth/Sex: Treating  RN: [birth year] (71 y.o. [name] Primary Care Karrigan Messamore: Alain Honey Other Clinician: Referring Jailen Lung: Treating Jacquel Mccamish/Extender: Neil Crouch in Treatment: 4 Wound Status Wound Number: 7 Primary Abrasion Etiology: Wound Location: Left,  Posterior Upper Arm Wound Open Wounding Event: Blister Status: Date Acquired: 11/09/2021 Comorbid Cataracts, Glaucoma, Anemia, Chronic Obstructive Pulmonary Weeks Of Treatment: 4 History: Disease (COPD), Congestive Heart Failure, Coronary Artery Clustered Wound: No Disease, Hypertension, Myocardial Infarction, Hepatitis C, Type II Diabetes, Gout, Osteoarthritis, Neuropathy Photos Wound Measurements Length: (cm) 0.4 Width: (cm) 0.1 Depth: (cm) 0.1 Area: (cm) 0.031 Volume: (cm) 0.003 % Reduction in Area: 99.1% % Reduction in Volume: 99.1% Epithelialization: Medium (34-66%) Tunneling: No Undermining: No Wound Description Classification: Full Thickness Without Exposed Support Structures Wound Margin: Flat and Intact Exudate Amount: Medium Exudate Type: Serosanguineous Exudate Color: red, brown Foul Odor After Cleansing: No Slough/Fibrino Yes Wound Bed Granulation Amount: Medium (34-66%) Exposed Structure Granulation Quality: Red Fascia Exposed: No Necrotic Amount: Medium (34-66%) Fat Layer (Subcutaneous Tissue) Exposed: Yes Necrotic Quality: Eschar, Adherent Slough Tendon Exposed: No Muscle Exposed: No Joint Exposed: No Bone Exposed: No Treatment Notes Wound #7 (Upper Arm) Wound Laterality: Left, Posterior Cleanser Soap and Water Discharge Instruction: May shower and wash wound with dial antibacterial soap and water prior to dressing change. Wound Cleanser Discharge Instruction: Cleanse the wound with wound cleanser prior to applying a clean dressing using gauze sponges, not tissue or cotton balls. Peri-Wound Care Topical Primary Dressing Hydrofera Blue Ready Foam, 2.5 x2.5 in Discharge Instruction: Apply to wound bed as instructed Secondary Dressing Bordered Gauze, 4x4 in Discharge Instruction: Apply over primary dressing as directed. Secured With Compression Wrap Compression Stockings Environmental education officer) Signed: 03/29/2022 5:25:14 PM By: Dellie Catholic RN Signed: 03/29/2022 5:33:26 PM By: Levan Hurst RN, BSN Entered By: Levan Hurst on 03/29/2022 11:06:58 -------------------------------------------------------------------------------- Wound Assessment Details Patient Name: Date of Service: [name] 03/29/2022 11:00 A M Medical Record Number: [id number] Patient Account Number: [id number] Date of Birth/Sex: Treating RN: 1951-08-27 (71 y.o. [name] Primary Care Seleny Allbright: Alain Honey Other Clinician: Referring Praneel Haisley: Treating Shalonda Sachse/Extender: Neil Crouch in Treatment: 4 Wound Status Wound Number: 8 Primary Diabetic Wound/Ulcer of the Lower Extremity Etiology: Wound Location: Right, Plantar Foot Wound Open Wounding Event: Blister Status: Date Acquired: 03/08/2022 Comorbid Cataracts, Glaucoma, Anemia, Chronic Obstructive Pulmonary Weeks Of Treatment: 3 History: Disease (COPD), Congestive Heart Failure, Coronary Artery Clustered Wound: No Disease, Hypertension, Myocardial Infarction, Hepatitis C, Type II Diabetes, Gout, Osteoarthritis, Neuropathy Photos Wound Measurements Length: (cm) 0.4 Width: (cm) 0.4 Depth: (cm) 0.2 Area: (cm) 0.126 Volume: (cm) 0.025 % Reduction in Area: 88.5% % Reduction in Volume: 92.4% Epithelialization: Small (1-33%) Tunneling: No Undermining: No Wound Description Classification: Grade 2 Wound Margin: Thickened Exudate Amount: Medium Exudate Type: Serosanguineous Exudate Color: red, brown Foul Odor After Cleansing: No Slough/Fibrino Yes Wound Bed Granulation Amount: Large (67-100%) Exposed Structure Granulation Quality: Red Fascia Exposed: No Necrotic Amount: Small (1-33%) Fat Layer (Subcutaneous Tissue) Exposed: Yes Necrotic Quality: Adherent Slough Tendon Exposed: No Muscle Exposed: No Joint Exposed: No Bone Exposed: No Treatment Notes Wound #8 (Foot) Wound Laterality: Plantar, Right Cleanser Soap and  Water Discharge Instruction: May shower and wash wound with dial antibacterial soap and water prior to dressing change. Wound Cleanser Discharge Instruction: Cleanse the wound with wound cleanser prior to applying a clean dressing using gauze sponges, not tissue or cotton balls. Peri-Wound Care Topical Primary Dressing Hydrofera Blue Ready Foam, 2.5 x2.5  in Discharge Instruction: Apply to wound bed as instructed Secondary Dressing Optifoam Non-Adhesive Dressing, 4x4 in Discharge Instruction: Apply over primary dressing as directed. Woven Gauze Sponges 2x2 in Discharge Instruction: Apply over primary dressing as directed. Secured With Conforming Stretch Gauze Bandage, Sterile 2x75 (in/in) Discharge Instruction: Secure with stretch gauze as directed. 20M Medipore Soft Cloth Surgical T 2x10 (in/yd) ape Discharge Instruction: Secure with tape as directed. Compression Wrap Compression Stockings Add-Ons Electronic Signature(s) Signed: 03/29/2022 5:25:14 PM By: Dellie Catholic RN Signed: 03/29/2022 5:33:26 PM By: Levan Hurst RN, BSN Entered By: Levan Hurst on 03/29/2022 11:02:06 -------------------------------------------------------------------------------- Vitals Details Patient Name: Date of Service: [name] 03/29/2022 11:00 A M Medical Record Number: [id number] Patient Account Number: [id number] Date of Birth/Sex: Treating RN: 11/16/1950 (71 y.o. [name] Primary Care Marin Wisner: Alain Honey Other Clinician: Referring Amarise Lillo: Treating Carli Lefevers/Extender: Neil Crouch in Treatment: 4 Vital Signs Time Taken: 10:51 Temperature (F): 97.9 Height (in): 74 Pulse (bpm): 69 Weight (lbs): 172 Respiratory Rate (breaths/min): 18 Body Mass Index (BMI): 22.1 Blood Pressure (mmHg): 163/77 Reference Range: 80 - 120 mg / dl Electronic Signature(s) Signed: 03/29/2022 5:25:14 PM By: Dellie Catholic RN Entered By: Dellie Catholic on  03/29/2022 10:52:09

## 2022-03-29 NOTE — Progress Notes (Signed)
This service is provided via telemedicine  No vital signs collected/recorded due to the encounter was a telemedicine visit.   Location of patient (ex: home, work):  Home  Patient consents to a telephone visit:    Location of the provider (ex: office, home):  Office  Name of any referring provider:  [name], MD   Names of all persons participating in the telemedicine service and their role in the encounter:  Demitrios Molyneux (patient); Jeffren Dombek,CMA; Dr. Lillette Boxer.Miller  Time spent on call:  8 minutes

## 2022-03-29 NOTE — Telephone Encounter (Signed)
Patient called and stated that he needs a Rx. Stated that he is not taking anything that is NOT on his list.  Stated he is in pain.  Pended Rx and sent to Dr. Sabra Heck for approval.

## 2022-03-30 ENCOUNTER — Other Ambulatory Visit (HOSPITAL_COMMUNITY): Payer: Self-pay

## 2022-03-30 DIAGNOSIS — N2581 Secondary hyperparathyroidism of renal origin: Secondary | ICD-10-CM | POA: Diagnosis not present

## 2022-03-30 DIAGNOSIS — Z992 Dependence on renal dialysis: Secondary | ICD-10-CM | POA: Diagnosis not present

## 2022-03-30 DIAGNOSIS — D631 Anemia in chronic kidney disease: Secondary | ICD-10-CM | POA: Diagnosis not present

## 2022-03-30 DIAGNOSIS — N186 End stage renal disease: Secondary | ICD-10-CM | POA: Diagnosis not present

## 2022-03-30 DIAGNOSIS — T8249XA Other complication of vascular dialysis catheter, initial encounter: Secondary | ICD-10-CM | POA: Diagnosis not present

## 2022-03-30 NOTE — Progress Notes (Signed)
  Subjective:  [name], male    DOB: Jan 11, 1951,  MRN: [id number]  Chief Complaint  Patient presents with   Diabetes    At risk foot care, 1 month follow up pre-ulcerative calluses    71 y.o. male presents with the above complaint. History confirmed with patient.  Calluses are quite thickened again.  Causing pain.  He is now going to wound care center to take care of the wound on the right foot.  Says this is doing well and he is seeing them this morning.  Also dealing with a wound on his fistula.  Nails are thickened elongated again.  Also causing pain in shoes.  Objective:  Physical Exam: warm, good capillary refill, normal DP and PT pulses, and stable small ulceration submetatarsal 5 on the right foot, he has preulcerative calluses submetatarsal 5 on the left foot, bilateral fifth metatarsal base and left hallux medial.  Nails are thickened elongated with yellow-brown discoloration and subungual debris crumbly texture.  Assessment:  No diagnosis found.   Plan:  Patient was evaluated and treated and all questions answered.  Patient educated on diabetes. Discussed proper diabetic foot care and discussed risks and complications of disease. Educated patient in depth on reasons to return to the office immediately should he/she discover anything concerning or new on the feet. All questions answered. Discussed proper shoes as well.   Discussed the etiology and treatment options for the condition in detail with the patient. Educated patient on the topical and oral treatment options for mycotic nails. Recommended debridement of the nails today. Sharp and mechanical debridement performed of all painful and mycotic nails today. Nails debrided in length and thickness using a nail nipper to level of comfort. Discussed treatment options including appropriate shoe gear. Follow up as needed for painful nails.   All symptomatic hyperkeratoses were safely debrided with a sterile #15  blade to patient's level of comfort without incident. We discussed preventative and palliative care of these lesions including supportive and accommodative shoegear, padding, prefabricated and custom molded accommodative orthoses, use of a pumice stone and lotions/creams daily.   Return in about 12 weeks (around 06/21/2022) for at risk diabetic foot care.

## 2022-03-31 ENCOUNTER — Other Ambulatory Visit: Payer: Self-pay

## 2022-03-31 ENCOUNTER — Ambulatory Visit (HOSPITAL_COMMUNITY)
Admission: RE | Admit: 2022-03-31 | Discharge: 2022-03-31 | Disposition: A | Discharge status: Home / Self Care | Payer: Medicare Other | Source: Ambulatory Visit | Attending: Gastroenterology | Admitting: Gastroenterology

## 2022-03-31 DIAGNOSIS — Q2730 Arteriovenous malformation, site unspecified: Secondary | ICD-10-CM | POA: Diagnosis not present

## 2022-03-31 DIAGNOSIS — N186 End stage renal disease: Secondary | ICD-10-CM

## 2022-03-31 MED ORDER — OCTREOTIDE ACETATE 10 MG IM KIT
10.0000 mg | PACK | INTRAMUSCULAR | Status: DC
  Administered 2022-03-31: 10 mg via INTRAMUSCULAR
  Filled 2022-03-31: qty 1

## 2022-04-01 ENCOUNTER — Telehealth: Payer: Self-pay

## 2022-04-01 DIAGNOSIS — N186 End stage renal disease: Secondary | ICD-10-CM | POA: Diagnosis not present

## 2022-04-01 DIAGNOSIS — D631 Anemia in chronic kidney disease: Secondary | ICD-10-CM | POA: Diagnosis not present

## 2022-04-01 DIAGNOSIS — N2581 Secondary hyperparathyroidism of renal origin: Secondary | ICD-10-CM | POA: Diagnosis not present

## 2022-04-01 DIAGNOSIS — Z992 Dependence on renal dialysis: Secondary | ICD-10-CM | POA: Diagnosis not present

## 2022-04-01 DIAGNOSIS — T8249XA Other complication of vascular dialysis catheter, initial encounter: Secondary | ICD-10-CM | POA: Diagnosis not present

## 2022-04-01 NOTE — Telephone Encounter (Signed)
On 6/20 patient is  going in for another surgery for the other arm.    FYI

## 2022-04-01 NOTE — Telephone Encounter (Signed)
Patient called office and asked to speak to [facility]. I told him that Jacob [name] is unavailable because she's on lunch. I let him know that I would be able to assist him. Patient refused and told me to have Jacob [name] call him back once she return from lunch. I explained to patient again that I'm just as capable as Jacob [name] to provide help for him. Patient refused again and insisted that he only wanted help from Jacob [name].M/CMA. Message routed to [place].

## 2022-04-04 DIAGNOSIS — N2581 Secondary hyperparathyroidism of renal origin: Secondary | ICD-10-CM | POA: Diagnosis not present

## 2022-04-04 DIAGNOSIS — Z992 Dependence on renal dialysis: Secondary | ICD-10-CM | POA: Diagnosis not present

## 2022-04-04 DIAGNOSIS — D631 Anemia in chronic kidney disease: Secondary | ICD-10-CM | POA: Diagnosis not present

## 2022-04-04 DIAGNOSIS — T8249XA Other complication of vascular dialysis catheter, initial encounter: Secondary | ICD-10-CM | POA: Diagnosis not present

## 2022-04-04 DIAGNOSIS — N186 End stage renal disease: Secondary | ICD-10-CM | POA: Diagnosis not present

## 2022-04-05 ENCOUNTER — Encounter (HOSPITAL_BASED_OUTPATIENT_CLINIC_OR_DEPARTMENT_OTHER): Payer: Medicare Other | Admitting: General Surgery

## 2022-04-05 DIAGNOSIS — L97512 Non-pressure chronic ulcer of other part of right foot with fat layer exposed: Secondary | ICD-10-CM | POA: Diagnosis not present

## 2022-04-05 DIAGNOSIS — J449 Chronic obstructive pulmonary disease, unspecified: Secondary | ICD-10-CM | POA: Diagnosis not present

## 2022-04-05 DIAGNOSIS — I5042 Chronic combined systolic (congestive) and diastolic (congestive) heart failure: Secondary | ICD-10-CM | POA: Diagnosis not present

## 2022-04-05 DIAGNOSIS — M1990 Unspecified osteoarthritis, unspecified site: Secondary | ICD-10-CM | POA: Diagnosis not present

## 2022-04-05 DIAGNOSIS — E11621 Type 2 diabetes mellitus with foot ulcer: Secondary | ICD-10-CM | POA: Diagnosis not present

## 2022-04-05 DIAGNOSIS — E785 Hyperlipidemia, unspecified: Secondary | ICD-10-CM | POA: Diagnosis not present

## 2022-04-05 DIAGNOSIS — L98492 Non-pressure chronic ulcer of skin of other sites with fat layer exposed: Secondary | ICD-10-CM | POA: Diagnosis not present

## 2022-04-05 DIAGNOSIS — N186 End stage renal disease: Secondary | ICD-10-CM | POA: Diagnosis not present

## 2022-04-05 DIAGNOSIS — E1140 Type 2 diabetes mellitus with diabetic neuropathy, unspecified: Secondary | ICD-10-CM | POA: Diagnosis not present

## 2022-04-05 DIAGNOSIS — Z951 Presence of aortocoronary bypass graft: Secondary | ICD-10-CM | POA: Diagnosis not present

## 2022-04-05 DIAGNOSIS — Z794 Long term (current) use of insulin: Secondary | ICD-10-CM | POA: Diagnosis not present

## 2022-04-05 DIAGNOSIS — E1122 Type 2 diabetes mellitus with diabetic chronic kidney disease: Secondary | ICD-10-CM | POA: Diagnosis not present

## 2022-04-05 DIAGNOSIS — N2581 Secondary hyperparathyroidism of renal origin: Secondary | ICD-10-CM | POA: Diagnosis not present

## 2022-04-05 DIAGNOSIS — I252 Old myocardial infarction: Secondary | ICD-10-CM | POA: Diagnosis not present

## 2022-04-05 DIAGNOSIS — F17210 Nicotine dependence, cigarettes, uncomplicated: Secondary | ICD-10-CM | POA: Diagnosis not present

## 2022-04-05 DIAGNOSIS — I132 Hypertensive heart and chronic kidney disease with heart failure and with stage 5 chronic kidney disease, or end stage renal disease: Secondary | ICD-10-CM | POA: Diagnosis not present

## 2022-04-05 DIAGNOSIS — I10 Essential (primary) hypertension: Secondary | ICD-10-CM | POA: Diagnosis not present

## 2022-04-05 NOTE — Progress Notes (Signed)
DAIMIAN, SUDBERRY ([id number]) Visit Report for 04/05/2022 Arrival Information Details Patient Name: Date of Service: MARKEISE, MATHEWS 04/05/2022 12:30 PM Medical Record Number: [id number] Patient Account Number: [id number] Date of Birth/Sex: Treating RN: 25-Jan-1951 (71 y.o. [name] Primary Care Tonishia Steffy: Alain Honey Other Clinician: Referring Keymari Sato: Treating Felipe Paluch/Extender: Neil Crouch in Treatment: 5 Visit Information History Since Last Visit Added or deleted any medications: No Patient Arrived: [name] Any new allergies or adverse reactions: No Arrival Time: 12:42 Had a fall or experienced change in No Accompanied By: self activities of daily living that may affect Transfer Assistance: None risk of falls: Patient Requires Transmission-Based Precautions: No Signs or symptoms of abuse/neglect since last visito No Patient Has Alerts: No Hospitalized since last visit: No Implantable device outside of the clinic excluding No cellular tissue based products placed in the center since last visit: Has Dressing in Place as Prescribed: Yes Pain Present Now: No Electronic Signature(s) Signed: 04/05/2022 5:25:18 PM By: Dellie Catholic RN Entered By: Dellie Catholic on 04/05/2022 12:43:12 -------------------------------------------------------------------------------- Encounter Discharge Information Details Patient Name: Date of Service: [name] 04/05/2022 12:30 PM Medical Record Number: [id number] Patient Account Number: [id number] Date of Birth/Sex: Treating RN: 09/25/1951 (71 y.o. Janyth Contes Primary Care Sergio Hobart: Alain Honey Other Clinician: Referring Sahily Biddle: Treating Yamaris Cummings/Extender: Neil Crouch in Treatment: 5 Encounter Discharge Information Items Post Procedure Vitals Discharge Condition: Stable Temperature (F): 97.5 Ambulatory Status: Ambulatory Pulse (bpm): 78 Discharge  Destination: Home Respiratory Rate (breaths/min): 16 Transportation: Private Auto Blood Pressure (mmHg): 134/64 Accompanied By: self Schedule Follow-up Appointment: Yes Clinical Summary of Care: Patient Declined Electronic Signature(s) Signed: 04/05/2022 5:03:19 PM By: Adline Peals Entered By: Adline Peals on 04/05/2022 16:55:58 -------------------------------------------------------------------------------- Lower Extremity Assessment Details Patient Name: Date of Service: JONAVON, TRIEU 04/05/2022 12:30 PM Medical Record Number: [id number] Patient Account Number: [id number] Date of Birth/Sex: Treating RN: 06/28/1951 (71 y.o. [name] Primary Care Li Fragoso: Alain Honey Other Clinician: Referring Modupe Shampine: Treating Alquan Morrish/Extender: Neil Crouch in Treatment: 5 Edema Assessment Assessed: [name]: No] [Right: No] E[Left: dema] [Right: :] Calf Left: Right: Point of Measurement: From Medial Instep 31 cm Ankle Left: Right: Point of Measurement: From Medial Instep 19 cm Electronic Signature(s) Signed: 04/05/2022 5:25:18 PM By: Dellie Catholic RN Entered By: Dellie Catholic on 04/05/2022 12:47:09 -------------------------------------------------------------------------------- Multi Wound Chart Details Patient Name: Date of Service: [name] 04/05/2022 12:30 PM Medical Record Number: [id number] Patient Account Number: [id number] Date of Birth/Sex: Treating RN: 03-06-1951 (71 y.o. [name] Primary Care Brenly Trawick: Alain Honey Other Clinician: Referring Fadia Marlar: Treating Kaianna Dolezal/Extender: Neil Crouch in Treatment: 5 Vital Signs Height(in): 84 Pulse(bpm): 26 Weight(lbs): 172 Blood Pressure(mmHg): 134/64 Body Mass Index(BMI): 22.1 Temperature(F): 97.5 Respiratory Rate(breaths/min): 16 Photos: [N/A:N/A] Left, Posterior Upper Arm Right, Plantar Foot N/A Wound  Location: Blister Blister N/A Wounding Event: Abrasion Diabetic Wound/Ulcer of the Lower N/A Primary Etiology: Extremity Cataracts, Glaucoma, Anemia, Chronic Cataracts, Glaucoma, Anemia, Chronic N/A Comorbid History: Obstructive Pulmonary Disease Obstructive Pulmonary Disease (COPD), Congestive Heart Failure, (COPD), Congestive Heart Failure, Coronary Artery Disease, Coronary Artery Disease, Hypertension, Myocardial Infarction, Hypertension, Myocardial Infarction, Hepatitis C, Type II Diabetes, Gout, Hepatitis C, Type II Diabetes, Gout, Osteoarthritis, Neuropathy Osteoarthritis, Neuropathy 11/09/2021 03/08/2022 N/A Date Acquired: 5 4 N/A Weeks of Treatment: Open Open N/A Wound Status: No No N/A Wound Recurrence: 0.2x0.1x0.1 0.3x0.3x0.2 N/A Measurements L x W x D (cm) 0.016 0.071  N/A A (cm) : rea 0.002 0.014 N/A Volume (cm) : 99.50% 93.50% N/A % Reduction in A rea: 99.40% 95.80% N/A % Reduction in Volume: Full Thickness Without Exposed Grade 2 N/A Classification: Support Structures Medium Medium N/A Exudate A mount: Serosanguineous Serosanguineous N/A Exudate Type: red, brown red, brown N/A Exudate Color: Flat and Intact Thickened N/A Wound Margin: Large (67-100%) Large (67-100%) N/A Granulation A mount: Red Red N/A Granulation Quality: None Present (0%) Small (1-33%) N/A Necrotic A mount: Fat Layer (Subcutaneous Tissue): Yes Fat Layer (Subcutaneous Tissue): Yes N/A Exposed Structures: Fascia: No Fascia: No Tendon: No Tendon: No Muscle: No Muscle: No Joint: No Joint: No Bone: No Bone: No Medium (34-66%) Small (1-33%) N/A Epithelialization: Debridement - Selective/Open Wound Debridement - Selective/Open Wound N/A Debridement: Pre-procedure Verification/Time Out 13:03 13:03 N/A Taken: Other Other N/A Pain Control: Necrotic/Eschar Callus, Slough N/A Tissue Debrided: Non-Viable Tissue Non-Viable Tissue N/A Level: 0.02 0.09 N/A Debridement A (sq  cm): rea Curette Curette N/A Instrument: Minimum Minimum N/A Bleeding: Pressure Pressure N/A Hemostasis A chieved: 0 0 N/A Procedural Pain: 0 0 N/A Post Procedural Pain: Procedure was tolerated well Procedure was tolerated well N/A Debridement Treatment Response: 0.2x0.1x0.1 0.3x0.3x0.2 N/A Post Debridement Measurements L x W x D (cm) 0.002 0.014 N/A Post Debridement Volume: (cm) Debridement Debridement N/A Procedures Performed: Treatment Notes Electronic Signature(s) Signed: 04/05/2022 1:18:36 PM By: Fredirick Maudlin MD FACS Signed: 04/05/2022 5:25:18 PM By: Dellie Catholic RN Entered By: Fredirick Maudlin on 04/05/2022 13:18:36 -------------------------------------------------------------------------------- Multi-Disciplinary Care Plan Details Patient Name: Date of Service: [name] 04/05/2022 12:30 PM Medical Record Number: [id number] Patient Account Number: [id number] Date of Birth/Sex: Treating RN: 13-Apr-1951 (71 y.o. [name] Primary Care Tonya [name]: Alain Honey Other Clinician: Referring Alize Borrayo: Treating Malvika Tung/Extender: Neil Crouch in Treatment: 5 Active Inactive Wound/Skin Impairment Nursing Diagnoses: Impaired tissue integrity Goals: Patient/caregiver will verbalize understanding of skin care regimen Date Initiated: 03/01/2022 Target Resolution Date: 04/22/2022 Goal Status: Active Interventions: Assess ulceration(s) every visit Treatment Activities: Skin care regimen initiated : 03/01/2022 Notes: Electronic Signature(s) Signed: 04/05/2022 5:25:18 PM By: Dellie Catholic RN Entered By: Dellie Catholic on 04/05/2022 13:11:00 -------------------------------------------------------------------------------- Pain Assessment Details Patient Name: Date of Service: NEIZAN, DEBRUHL 04/05/2022 12:30 PM Medical Record Number: [id number] Patient Account Number: [id number] Date of Birth/Sex: Treating RN: April 23, 1951  (71 y.o. [name] Primary Care Lacy Taglieri: Alain Honey Other Clinician: Referring Arriona Prest: Treating Bernis Schreur/Extender: Neil Crouch in Treatment: 5 Active Problems Location of Pain Severity and Description of Pain Patient Has Paino No Site Locations Pain Management and Medication Current Pain Management: Electronic Signature(s) Signed: 04/05/2022 5:25:18 PM By: Dellie Catholic RN Entered By: Dellie Catholic on 04/05/2022 12:47:03 -------------------------------------------------------------------------------- Patient/Caregiver Education Details Patient Name: Date of Service: [name] 6/13/2023andnbsp12:30 PM Medical Record Number: [id number] Patient Account Number: [id number] Date of Birth/Gender: Treating RN: 31-Jul-1951 (71 y.o. [name] Primary Care Physician: Alain Honey Other Clinician: Referring Physician: Treating Physician/Extender: Neil Crouch in Treatment: 5 Education Assessment Education Provided To: Patient Education Topics Provided Wound/Skin Impairment: Methods: Explain/Verbal Responses: Return demonstration correctly Electronic Signature(s) Signed: 04/05/2022 5:25:18 PM By: Dellie Catholic RN Entered By: Dellie Catholic on 04/05/2022 17:24:23 -------------------------------------------------------------------------------- Wound Assessment Details Patient Name: Date of Service: [name] 04/05/2022 12:30 PM Medical Record Number: [id number] Patient Account Number: [id number] Date of Birth/Sex: Treating RN: 06-02-1951 (71 y.o. [name] Primary Care Audra Kagel: Alain Honey Other Clinician: Referring  Prisha Hiley: Treating Rhenda [place]/Extender: Neil Crouch in Treatment: 5 Wound Status Wound Number: 7 Primary Abrasion Etiology: Wound Location: Left, Posterior Upper Arm Wound Open Wounding Event: Blister Status: Date  Acquired: 11/09/2021 Comorbid Cataracts, Glaucoma, Anemia, Chronic Obstructive Pulmonary Weeks Of Treatment: 5 History: Disease (COPD), Congestive Heart Failure, Coronary Artery Clustered Wound: No Disease, Hypertension, Myocardial Infarction, Hepatitis C, Type II Diabetes, Gout, Osteoarthritis, Neuropathy Photos Wound Measurements Length: (cm) 0.2 Width: (cm) 0.1 Depth: (cm) 0.1 Area: (cm) 0.016 Volume: (cm) 0.002 % Reduction in Area: 99.5% % Reduction in Volume: 99.4% Epithelialization: Medium (34-66%) Tunneling: No Undermining: No Wound Description Classification: Full Thickness Without Exposed Support Structures Wound Margin: Flat and Intact Exudate Amount: Medium Exudate Type: Serosanguineous Exudate Color: red, brown Foul Odor After Cleansing: No Slough/Fibrino No Wound Bed Granulation Amount: Large (67-100%) Exposed Structure Granulation Quality: Red Fascia Exposed: No Necrotic Amount: None Present (0%) Fat Layer (Subcutaneous Tissue) Exposed: Yes Tendon Exposed: No Muscle Exposed: No Joint Exposed: No Bone Exposed: No Treatment Notes Wound #7 (Upper Arm) Wound Laterality: Left, Posterior Cleanser Soap and Water Discharge Instruction: May shower and wash wound with dial antibacterial soap and water prior to dressing change. Wound Cleanser Discharge Instruction: Cleanse the wound with wound cleanser prior to applying a clean dressing using gauze sponges, not tissue or cotton balls. Peri-Wound Care Topical Primary Dressing Hydrofera Blue Ready Foam, 2.5 x2.5 in Discharge Instruction: Apply to wound bed as instructed Secondary Dressing Bordered Gauze, 4x4 in Discharge Instruction: Apply over primary dressing as directed. Secured With Compression Wrap Compression Stockings Environmental education officer) Signed: 04/05/2022 5:25:18 PM By: Dellie Catholic RN Entered By: Dellie Catholic on 04/05/2022  12:57:08 -------------------------------------------------------------------------------- Wound Assessment Details Patient Name: Date of Service: [name] 04/05/2022 12:30 PM Medical Record Number: [id number] Patient Account Number: [id number] Date of Birth/Sex: Treating RN: Nov 05, 1950 (71 y.o. [name] Primary Care Derrick Tiegs: Alain Honey Other Clinician: Referring Dealie Koelzer: Treating Coltan Spinello/Extender: Neil Crouch in Treatment: 5 Wound Status Wound Number: 8 Primary Diabetic Wound/Ulcer of the Lower Extremity Etiology: Wound Location: Right, Plantar Foot Wound Open Wounding Event: Blister Status: Date Acquired: 03/08/2022 Comorbid Cataracts, Glaucoma, Anemia, Chronic Obstructive Pulmonary Weeks Of Treatment: 4 History: Disease (COPD), Congestive Heart Failure, Coronary Artery Clustered Wound: No Disease, Hypertension, Myocardial Infarction, Hepatitis C, Type II Diabetes, Gout, Osteoarthritis, Neuropathy Photos Wound Measurements Length: (cm) 0.3 Width: (cm) 0.3 Depth: (cm) 0.2 Area: (cm) 0.071 Volume: (cm) 0.014 % Reduction in Area: 93.5% % Reduction in Volume: 95.8% Epithelialization: Small (1-33%) Tunneling: No Undermining: No Wound Description Classification: Grade 2 Wound Margin: Thickened Exudate Amount: Medium Exudate Type: Serosanguineous Exudate Color: red, brown Foul Odor After Cleansing: No Slough/Fibrino Yes Wound Bed Granulation Amount: Large (67-100%) Exposed Structure Granulation Quality: Red Fascia Exposed: No Necrotic Amount: Small (1-33%) Fat Layer (Subcutaneous Tissue) Exposed: Yes Necrotic Quality: Adherent Slough Tendon Exposed: No Muscle Exposed: No Joint Exposed: No Bone Exposed: No Treatment Notes Wound #8 (Foot) Wound Laterality: Plantar, Right Cleanser Soap and Water Discharge Instruction: May shower and wash wound with dial antibacterial soap and water prior to dressing  change. Wound Cleanser Discharge Instruction: Cleanse the wound with wound cleanser prior to applying a clean dressing using gauze sponges, not tissue or cotton balls. Peri-Wound Care Topical Primary Dressing Hydrofera Blue Ready Foam, 2.5 x2.5 in Discharge Instruction: Apply to wound bed as instructed Secondary Dressing Bordered Gauze, 4x4 in Discharge Instruction: Apply over primary dressing as directed. Secured With Compression Wrap Compression Stockings Add-Ons  Electronic Signature(s) Signed: 04/05/2022 5:25:18 PM By: Dellie Catholic RN Entered By: Dellie Catholic on 04/05/2022 12:56:38 -------------------------------------------------------------------------------- Vitals Details Patient Name: Date of Service: [name] 04/05/2022 12:30 PM Medical Record Number: [id number] Patient Account Number: [id number] Date of Birth/Sex: Treating RN: 12-02-1950 (71 y.o. [name] Primary Care Siegfried Vieth: Alain Honey Other Clinician: Referring Kassey Laforest: Treating Kaaliyah Kita/Extender: Neil Crouch in Treatment: 5 Vital Signs Time Taken: 12:43 Temperature (F): 97.5 Height (in): 74 Pulse (bpm): 78 Weight (lbs): 172 Respiratory Rate (breaths/min): 16 Body Mass Index (BMI): 22.1 Blood Pressure (mmHg): 134/64 Reference Range: 80 - 120 mg / dl Electronic Signature(s) Signed: 04/05/2022 5:25:18 PM By: Dellie Catholic RN Entered By: Dellie Catholic on 04/05/2022 12:46:57

## 2022-04-05 NOTE — Progress Notes (Signed)
[name], [name] ([id number]) Visit Report for 04/05/2022 Chief Complaint Document Details Patient Name: Date of Service: Jacob [name], Jacob [name] 04/05/2022 12:30 PM Medical Record Number: [id number] Patient Account Number: [id number] Date of Birth/Sex: Treating RN: 1951/03/15 (71 y.o. Jacob [name] Primary Care Provider: Alain Honey Other Clinician: Referring Provider: Treating Provider/Extender: Neil Crouch in Treatment: 5 Information Obtained from: Patient Chief Complaint Patient seen for complaints of Non-Healing Wounds x 3 Electronic Signature(s) Signed: 04/05/2022 1:18:42 PM By: Fredirick Maudlin MD FACS Entered By: Fredirick Maudlin on 04/05/2022 13:18:41 -------------------------------------------------------------------------------- Debridement Details Patient Name: Date of Service: Jacob [name]. 04/05/2022 12:30 PM Medical Record Number: [id number] Patient Account Number: [id number] Date of Birth/Sex: Treating RN: [birth year] (71 y.o. Jacob [name] Primary Care Provider: Alain Honey Other Clinician: Referring Provider: Treating Provider/Extender: Neil Crouch in Treatment: 5 Debridement Performed for Assessment: Wound #7 Left,Posterior Upper Arm Performed By: Physician Fredirick Maudlin, MD Debridement Type: Debridement Level of Consciousness (Pre-procedure): Awake and Alert Pre-procedure Verification/Time Out Yes - 13:03 Taken: Start Time: 13:03 Pain Control: Other : Benzocaine 20% T Area Debrided (L x W): otal 0.2 (cm) x 0.1 (cm) = 0.02 (cm) Tissue and other material debrided: Non-Viable, Eschar Level: Non-Viable Tissue Debridement Description: Selective/Open Wound Instrument: Curette Bleeding: Minimum Hemostasis Achieved: Pressure End Time: 13:05 Procedural Pain: 0 Post Procedural Pain: 0 Response to Treatment: Procedure was tolerated well Level of Consciousness (Post- Awake and  Alert procedure): Post Debridement Measurements of Total Wound Length: (cm) 0.2 Width: (cm) 0.1 Depth: (cm) 0.1 Volume: (cm) 0.002 Character of Wound/Ulcer Post Debridement: Improved Post Procedure Diagnosis Same as Pre-procedure Electronic Signature(s) Signed: 04/05/2022 2:54:13 PM By: Fredirick Maudlin MD FACS Signed: 04/05/2022 5:25:18 PM By: Dellie Catholic RN Entered By: Dellie Catholic on 04/05/2022 13:08:48 -------------------------------------------------------------------------------- Debridement Details Patient Name: Date of Service: Jacob Katos R. 04/05/2022 12:30 PM Medical Record Number: [id number] Patient Account Number: [id number] Date of Birth/Sex: Treating RN: [birth year] (71 y.o. Jacob [name] Primary Care Provider: Alain Honey Other Clinician: Referring Provider: Treating Provider/Extender: Neil Crouch in Treatment: 5 Debridement Performed for Assessment: Wound #8 Right,Plantar Foot Performed By: Physician Fredirick Maudlin, MD Debridement Type: Debridement Severity of Tissue Pre Debridement: Fat layer exposed Level of Consciousness (Pre-procedure): Awake and Alert Pre-procedure Verification/Time Out Yes - 13:03 Taken: Start Time: 13:03 Pain Control: Other : Benzocaine 20% T Area Debrided (L x W): otal 0.3 (cm) x 0.3 (cm) = 0.09 (cm) Tissue and other material debrided: Non-Viable, Callus, Slough, Slough Level: Non-Viable Tissue Debridement Description: Selective/Open Wound Instrument: Curette Bleeding: Minimum Hemostasis Achieved: Pressure End Time: 13:05 Procedural Pain: 0 Post Procedural Pain: 0 Response to Treatment: Procedure was tolerated well Level of Consciousness (Post- Awake and Alert procedure): Post Debridement Measurements of Total Wound Length: (cm) 0.3 Width: (cm) 0.3 Depth: (cm) 0.2 Volume: (cm) 0.014 Character of Wound/Ulcer Post Debridement: Improved Severity of Tissue Post Debridement:  Fat layer exposed Post Procedure Diagnosis Same as Pre-procedure Electronic Signature(s) Signed: 04/05/2022 2:54:13 PM By: Fredirick Maudlin MD FACS Signed: 04/05/2022 5:25:18 PM By: Dellie Catholic RN Entered By: Dellie Catholic on 04/05/2022 13:09:34 -------------------------------------------------------------------------------- HPI Details Patient Name: Date of Service: Jacob Katos R. 04/05/2022 12:30 PM Medical Record Number: [id number] Patient Account Number: [id number] Date of Birth/Sex: Treating RN: 1951-06-04 (71 y.o. Jacob [name] Primary Care Provider: Alain Honey Other Clinician: Referring Provider: Treating Provider/Extender: Neil Crouch in Treatment: 5 History of Present  Illness Location: 2 ulcers noted to the left hallux Quality: the patient denies pain Duration: these were originally blisters towards the end of February and opened in early March Context: these wounds are secondary to pressure; he got new shoes in February and also started wearing a toe cover/sleeve Modifying Factors: pressure and friction are exacerbating factor HPI Description: 01/12/17- [name] arrives for initial evaluation of 2 ulcers to his left hallux. He states that back in February he did receive a new pair of diabetic shoes and at that time he started wearing toe cover/sleeves and noticed development of a blister to the dorsal and plantar aspect. He has been seen at [facility] by both Dr. Amalia Hailey and Dr. Jacqualyn Posey. He last saw Dr.Wagoner on 3/8 at which time he was prescribed Silvadene and Keflex. He completed that dose of Keflex and states that he got a phone call to extend the Keflex prior to this appointment. He has a follow-up with Dr. Amalia Hailey on 3/26. He has been wearing a surgical shoe, no significant offloading to the plantar/medial ulcer but the strap does not extend over the dorsal ulcer. Topically he has been applying the Silvadene daily and a dry  dressing. He is a diabetic, on insulin, with a recent A1c of 6.2% (11/10/16). He is a current smoker, 0.5 PPD. He has no known diagnosis of arterial insufficiency. 01/20/17- The patient arrives for follow up evaluation of his ulcers to the left hallux. He admits to continuing to smoke approximately 0.5ppd. He states that his diet has been somewhat uncontrolled with higher than normal glucose levels, this seems circumstantial as he had a death in the family as food and schedule has been disrupted. he admits to wearing the surgical show with offloading modifications while out of the home, but has been ambulating barefoot around the home. he saw Dr. Amalia Hailey yesterday for toenail trimming and callus shaving to the right plantar foot and investigation of his ulcers. 01/26/17- he is here for follow-up evaluation of his left hallux ulcers. He states he has been significantly more compliant in the use of his offloading surgical shoe, with the exception of "getting up for a glass of water" he states that he has worn it with any ambulation. He states he has decreased his smoking too, admitting to only a cigarette since yesterday. He states his blood sugar this morning was 147 and yesterday was 116. He is voicing no complaints or concerns 02/02/17- He is here for follow-up evaluation of his left hallux ulcers. The dorsal ulcer is crusted over/healed. He states he has minimized his ambulation over this past week, much less than the previous week. He states his glucose levels have been between 120-150 about half of the time, otherwise greater than 150. He cannot correlate any specific dietary changes. He does have an appointment with a dietitian next month. He was advised to keep a log of his glucose levels and his diet for that appointment. 02/06/17; patient comes in today for first application of the total contact cast. Her intake nurse reported the wounds look better therefore I did not actually see these before  application of the cast. He'll return on Thursday for reapplication in the standard fashion 02/10/27- he is here for follow-up evaluation of his left plantar hallux ulcer. He came in Monday for application of a total contact cast. He states that he was tolerating that Monday, but noticed on Tuesday that his foot began to slide in the cast. He also is complaining of  the heaviness of the cast, as he ambulates as his primary form of transportation. He continues to smoke,. He has not contacted his cardiologist regarding Nicorette gum. He states his blood sugars have been slightly elevated, this morning being 206. He states that he will complete an antibiotic tonight. He states this was prescribed by Dr. Jacqualyn Posey. He states that he has refilled this prescription twice, without direction to continue taking this medication. 02/16/17; patient wound about the same as last week per her description. Rolled edges of callus scan and nonviable subcutaneous tissue. Using Prisma. Apparently used topical antibiotic instead of the Prisma over the course of this week. He has not a total contact cast candidate secondary to ambulation needs and unsteadiness on his feet per discussion with our intake nurse 03/02/17 patient requires debridement. He has a small wound on the right great toe which probes however does not approach bone. What I can see of the base of this wound looks reasonably healthy and I think this is largely in offloading issue with the second toe crossing over the first. He did not tolerate a total contact cast has balance issues 5/171/8. 0.2x0.2x0.2 less depth. Using prisma 03/16/17; no major change. Still a small probing wound with copious amounts of surrounding callus skin nonviable subcutaneous tissue all of which debrided. Still using Prisma. I suspect the dimensions post debridement are about the same as listed above 0.2 x 0.2 x 0.2 03/23/17; no major change using Prisma. Once again a large amount of  callus nonviable subcutaneous tissue around the wound. I went over the offloading issues with the patient and the nurse. He did not tolerate a total contact cast he uses public transportation among other issues. 03/30/17; no major change using Prisma. Once again a large amount of callus and nonviable tissue around the wound requires debridement. Saw Dr. Amalia Hailey who pared calluses on the other foot. 04/06/17; the wound is much smaller there is callus around this although I think I can see the base of the wound and I elected not to do any debridement. 04/13/17 this is a wound I did not debridement last week left callus intact. He arrives today with the area much in the same predicament however this time I remove the callus to find a deep probing hole. This is disappointing this is not go to bone. We have been using Prisma. 04/20/17 plantar left first toe. Again although a lot of circumferential callus around the wound which requires debridement however this does not appear to be a probing area. Culture I did last week show coag-negative staph. I thought this was probably not something that was worth treating but In the interim he was seen by his primary care with parotitis. He is now on clindamycin and Cipro and the Staphylococcus we cultured is Cipro sensitive [lugdunensis} 04/27/17; plantar left first toe. Again a lot of circumferential callus around a small open area. Removing the callus also nonviable subcutaneous tissue. The wound looks better today. He has completed his antibiotics for the parotitis. I didn't think additional antibiotics were necessary in terms of the wound 05/11/17; plantar first toe. I thought this might be closed this week however once again he has a deep probing small hole. Using pickups and a scalpel debridement of thick callus and subcutaneous tissue from around the wound reveals a depth of about 0.4 cm. We have not been able to offload the area. There is no obvious infection here. No  evidence of ischemia 05/18/17; since his last visit he's  been in hospital for oral surgery apparently to remove a retained surgical screw. He has swelling of his jaw and has some discomfort. He arrives today with the plantar left first toe in the same predicament. No major change. Each time he comes in here he has thick adherent callus 05/25/17; arrives today with roughly the same condition of the plantar left great toe. Perhaps somewhat better less callus. There are no options for offloading. He is on his feet a lot and I think this is largely a pressure issue X-ray I did last week showed no evidence of osteomyelitis. He has a toe separator as the great toe tends to ride under the second toe when he is walking. I'm hopeful that this will allow for a little less pressure on the plantar aspect of the left great toe 06/07/2017 -- he is working hard on giving up smoking and other than that has been wearing his diabetic shoes and is also trying to offload as much as possible. 06/15/17; patient arrived in clinic today with a difficult area on his left medial great toe closed over. There is still amount of callus on this however I don't believe there is any underlying open wound. He has custom shoes with inserts. I still think he is going to need to offload this area going forward. He also follows with Dr. Amalia Hailey of podiatry for routine foot care READMISSION 11/01/16; patient arrives in clinic today with a five-day history of a new area on the previously problematic left great toe. He is wearing his diabetic shoes and he apparently has a new pair coming. The patient is a type II diabetic with neuropathy. He follows with Dr. Amalia Hailey of podiatry for pre-ulcerative callus on his bilateral feet last seen on 08/24/17. He has developed a large blister on top of his left great toe this is since gone down somewhat. He is not been dressing this specifically. He does not have a history of significant PAD previous ABIs in  this clinic were1.1 11/06/17; left great toe has epithelialized since last time. I think this was caused by a insoles an issue pushing the toe against the dorsal aspect of the shoe itself has new diabetic shoes coming hopefully will have more forefoot depth. READMISSION 03/01/2022 This is a now 71 year old man with a past medical history significant for type 2 diabetes mellitus with end-stage renal disease on hemodialysis as as well as congestive heart failure, ischemic cardiomyopathy, hypertension, and prior diabetic foot ulcers. He has had multiple issues with his hemodialysis access. He had a prior left radiocephalic fistula that failed. In October 2020, he had a left brachiobasilic fistula. He apparently underwent a procedure at an outside vascular office on November 09, 2021. Subsequent to that, he developed a wound on his left posterior forearm just distal to the elbow as well as wounds on 2 of his left digits. He was diagnosed with steal syndrome and recently underwent ligation of his fistula with placement of a hemodialysis catheter. He is here today for assistance in healing the wounds that developed related to steal syndrome. He says that he has been applying Neosporin to the sites and they are fairly sensitive to the touch. 03/08/2022: The wounds on his fingers are much smaller and have just a little bit of accumulated thin eschar. The wound on his left posterior forearm is also smaller but has accumulated both slough and eschar. He also has a thick callus on his right fifth metatarsal head plantar surface and unfortunately, it  appears that there is an open wound underneath the callus. We have been using Iodosorb with Hydrofera Blue to all of his open wounds. Pain is much improved from last week. 03/15/2022: The wounds on his fingers have healed. The wound on his left posterior forearm is smaller and has a nice layer of granulation tissue with just a bit of accumulated slough. He has built up  additional callus around the right fifth metatarsal head plantar surface, but the underlying wound has a good surface with granulation tissue present. We have been using Iodosorb with Hydrofera Blue. He did not endorse any pain this week. 03/22/2022: The wound on his left posterior forearm continues to contract. There is good granulation tissue present and just a little bit of periwound eschar and overlying slough. The right fifth metatarsal head wound has once again accumulated some callus and there is slough within the wound. The intake nurse measured a area of undermining from 7-10 o'clock. 03/29/2022: The patient saw podiatry this week for nail trim and they also pared back a number of calluses. The wound on his left posterior forearm is substantially smaller with just some overlying thin eschar. The right fifth metatarsal head has a bit of callus accumulation as well as some slough in the wound bed but is otherwise clean. It is smaller today, as well. 04/05/2022: The wound on his left posterior forearm is nearly closed with just a small amount of overlying eschar. The right fifth metatarsal head wound is smaller again this week. There is some periwound callus accumulation and some slough in the wound bed. He is going to get new dialysis access placed next week. Electronic Signature(s) Signed: 04/05/2022 1:19:34 PM By: Fredirick Maudlin MD FACS Entered By: Fredirick Maudlin on 04/05/2022 13:19:34 -------------------------------------------------------------------------------- Physical Exam Details Patient Name: Date of Service: Jacob [name]. 04/05/2022 12:30 PM Medical Record Number: [id number] Patient Account Number: [id number] Date of Birth/Sex: Treating RN: 1951-02-28 (71 y.o. Jacob [name] Primary Care Provider: Alain Honey Other Clinician: Referring Provider: Treating Provider/Extender: Neil Crouch in Treatment: 5 Constitutional . . . . No acute  distress.[name] Respiratory Normal work of breathing on room air.. Notes 04/05/2022: The wound on his left posterior forearm is nearly closed with just a small amount of overlying eschar. The right fifth metatarsal head wound is smaller again this week. There is some periwound callus accumulation and some slough in the wound bed. Electronic Signature(s) Signed: 04/05/2022 1:22:13 PM By: Fredirick Maudlin MD FACS Entered By: Fredirick Maudlin on 04/05/2022 13:22:13 -------------------------------------------------------------------------------- Physician Orders Details Patient Name: Date of Service: Jacob [name]. 04/05/2022 12:30 PM Medical Record Number: [id number] Patient Account Number: [id number] Date of Birth/Sex: Treating RN: 1950-11-14 (71 y.o. Jacob [name] Primary Care Provider: Other Clinician: Alain Honey Referring Provider: Treating Provider/Extender: Neil Crouch in Treatment: 5 Verbal / Phone Orders: No Diagnosis Coding ICD-10 Coding Code Description L98.499 Non-pressure chronic ulcer of skin of other sites with unspecified severity E11.22 Type 2 diabetes mellitus with diabetic chronic kidney disease J44.9 Chronic obstructive pulmonary disease, unspecified I50.42 Chronic combined systolic (congestive) and diastolic (congestive) heart failure I10 Essential (primary) hypertension N25.81 Secondary hyperparathyroidism of renal origin N18.6 End stage renal disease B20 Human immunodeficiency virus [HIV] disease L97.512 Non-pressure chronic ulcer of other part of right foot with fat layer exposed Follow-up Appointments ppointment in 2 weeks. - Dr Celine Ahr Room 3 June 27th at 11am Return A Licensed conveyancer Other Bathing/Shower/Hygiene Orders/Instructions: -  Change dressing after bathing Wound Treatment Wound #7 - Upper Arm Wound Laterality: Left, Posterior Cleanser: Soap and Water Discharge Instructions: May shower and wash wound  with dial antibacterial soap and water prior to dressing change. Cleanser: Wound Cleanser (Generic) Discharge Instructions: Cleanse the wound with wound cleanser prior to applying a clean dressing using gauze sponges, not tissue or cotton balls. Prim Dressing: Hydrofera Blue Ready Foam, 2.5 x2.5 in (Generic) ary Discharge Instructions: Apply to wound bed as instructed Secondary Dressing: Bordered Gauze, 4x4 in (Generic) Discharge Instructions: Apply over primary dressing as directed. Wound #8 - Foot Wound Laterality: Plantar, Right Cleanser: Soap and Water Discharge Instructions: May shower and wash wound with dial antibacterial soap and water prior to dressing change. Cleanser: Wound Cleanser (Generic) Discharge Instructions: Cleanse the wound with wound cleanser prior to applying a clean dressing using gauze sponges, not tissue or cotton balls. Prim Dressing: Hydrofera Blue Ready Foam, 2.5 x2.5 in (Generic) ary Discharge Instructions: Apply to wound bed as instructed Secondary Dressing: Bordered Gauze, 4x4 in (Generic) Discharge Instructions: Apply over primary dressing as directed. Electronic Signature(s) Signed: 04/05/2022 1:22:28 PM By: Fredirick Maudlin MD FACS Entered By: Fredirick Maudlin on 04/05/2022 13:22:27 -------------------------------------------------------------------------------- Problem List Details Patient Name: Date of Service: Jacob [name]. 04/05/2022 12:30 PM Medical Record Number: [id number] Patient Account Number: [id number] Date of Birth/Sex: Treating RN: 1951-07-22 (71 y.o. Jacob [name] Primary Care Provider: Alain Honey Other Clinician: Referring Provider: Treating Provider/Extender: Neil Crouch in Treatment: 5 Active Problems ICD-10 Encounter Code Description Active Date MDM Diagnosis L98.499 Non-pressure chronic ulcer of skin of other sites with unspecified severity 03/01/2022 No Yes E11.22 Type 2 diabetes  mellitus with diabetic chronic kidney disease 03/01/2022 No Yes J44.9 Chronic obstructive pulmonary disease, unspecified 03/01/2022 No Yes I50.42 Chronic combined systolic (congestive) and diastolic (congestive) heart failure 03/01/2022 No Yes I10 Essential (primary) hypertension 03/01/2022 No Yes N25.81 Secondary hyperparathyroidism of renal origin 03/01/2022 No Yes N18.6 End stage renal disease 03/01/2022 No Yes B20 Human immunodeficiency virus [HIV] disease 03/01/2022 No Yes L97.512 Non-pressure chronic ulcer of other part of right foot with fat layer exposed 03/08/2022 No Yes Inactive Problems Resolved Problems Electronic Signature(s) Signed: 04/05/2022 1:18:30 PM By: Fredirick Maudlin MD FACS Entered By: Fredirick Maudlin on 04/05/2022 13:18:29 -------------------------------------------------------------------------------- Progress Note Details Patient Name: Date of Service: Jacob Katos R. 04/05/2022 12:30 PM Medical Record Number: [id number] Patient Account Number: [id number] Date of Birth/Sex: Treating RN: Dec 06, 1950 (71 y.o. Jacob [name] Primary Care Provider: Alain Honey Other Clinician: Referring Provider: Treating Provider/Extender: Neil Crouch in Treatment: 5 Subjective Chief Complaint Information obtained from Patient Patient seen for complaints of Non-Healing Wounds x 3 History of Present Illness (HPI) The following HPI elements were documented for the patient's wound: Location: 2 ulcers noted to the left hallux Quality: the patient denies pain Duration: these were originally blisters towards the end of February and opened in early March Context: these wounds are secondary to pressure; he got new shoes in February and also started wearing a toe cover/sleeve Modifying Factors: pressure and friction are exacerbating factor 01/12/17- Mr. Shiller arrives for initial evaluation of 2 ulcers to his left hallux. He states that back in February he did  receive a new pair of diabetic shoes and at that time he started wearing toe cover/sleeves and noticed development of a blister to the dorsal and plantar aspect. He has been seen at [facility] by both Dr. Amalia Hailey and  Dr. Jacqualyn Posey. He last saw Dr.Wagoner on 3/8 at which time he was prescribed Silvadene and Keflex. He completed that dose of Keflex and states that he got a phone call to extend the Keflex prior to this appointment. He has a follow-up with Dr. Amalia Hailey on 3/26. He has been wearing a surgical shoe, no significant offloading to the plantar/medial ulcer but the strap does not extend over the dorsal ulcer. T opically he has been applying the Silvadene daily and a dry dressing. He is a diabetic, on insulin, with a recent A1c of 6.2% (11/10/16). He is a current smoker, 0.5 PPD. He has no known diagnosis of arterial insufficiency. 01/20/17- The patient arrives for follow up evaluation of his ulcers to the left hallux. He admits to continuing to smoke approximately 0.5ppd. He states that his diet has been somewhat uncontrolled with higher than normal glucose levels, this seems circumstantial as he had a death in the family as food and schedule has been disrupted. he admits to wearing the surgical show with offloading modifications while out of the home, but has been ambulating barefoot around the home. he saw Dr. Amalia Hailey yesterday for toenail trimming and callus shaving to the right plantar foot and investigation of his ulcers. 01/26/17- he is here for follow-up evaluation of his left hallux ulcers. He states he has been significantly more compliant in the use of his offloading surgical shoe, with the exception of "getting up for a glass of water" he states that he has worn it with any ambulation. He states he has decreased his smoking too, admitting to only a cigarette since yesterday. He states his blood sugar this morning was 147 and yesterday was 116. He is voicing no complaints or concerns 02/02/17-  He is here for follow-up evaluation of his left hallux ulcers. The dorsal ulcer is crusted over/healed. He states he has minimized his ambulation over this past week, much less than the previous week. He states his glucose levels have been between 120-150 about half of the time, otherwise greater than 150. He cannot correlate any specific dietary changes. He does have an appointment with a dietitian next month. He was advised to keep a log of his glucose levels and his diet for that appointment. 02/06/17; patient comes in today for first application of the total contact cast. Her intake nurse reported the wounds look better therefore I did not actually see these before application of the cast. He'll return on Thursday for reapplication in the standard fashion 02/10/27- he is here for follow-up evaluation of his left plantar hallux ulcer. He came in Monday for application of a total contact cast. He states that he was tolerating that Monday, but noticed on Tuesday that his foot began to slide in the cast. He also is complaining of the heaviness of the cast, as he ambulates as his primary form of transportation. He continues to smoke,. He has not contacted his cardiologist regarding Nicorette gum. He states his blood sugars have been slightly elevated, this morning being 206. He states that he will complete an antibiotic tonight. He states this was prescribed by Dr. Jacqualyn Posey. He states that he has refilled this prescription twice, without direction to continue taking this medication. 02/16/17; patient wound about the same as last week per her description. Rolled edges of callus scan and nonviable subcutaneous tissue. Using Prisma. Apparently used topical antibiotic instead of the Prisma over the course of this week. He has not a total contact cast candidate secondary to ambulation needs  and unsteadiness on his feet per discussion with our intake nurse 03/02/17 patient requires debridement. He has a small  wound on the right great toe which probes however does not approach bone. What I can see of the base of this wound looks reasonably healthy and I think this is largely in offloading issue with the second toe crossing over the first. He did not tolerate a total contact cast has balance issues 5/171/8. 0.2x0.2x0.2 less depth. Using prisma 03/16/17; no major change. Still a small probing wound with copious amounts of surrounding callus skin nonviable subcutaneous tissue all of which debrided. Still using Prisma. I suspect the dimensions post debridement are about the same as listed above 0.2 x 0.2 x 0.2 03/23/17; no major change using Prisma. Once again a large amount of callus nonviable subcutaneous tissue around the wound. I went over the offloading issues with the patient and the nurse. He did not tolerate a total contact cast he uses public transportation among other issues. 03/30/17; no major change using Prisma. Once again a large amount of callus and nonviable tissue around the wound requires debridement. Saw Dr. Amalia Hailey who pared calluses on the other foot. 04/06/17; the wound is much smaller there is callus around this although I think I can see the base of the wound and I elected not to do any debridement. 04/13/17 this is a wound I did not debridement last week left callus intact. He arrives today with the area much in the same predicament however this time I remove the callus to find a deep probing hole. This is disappointing this is not go to bone. We have been using Prisma. 04/20/17 plantar left first toe. Again although a lot of circumferential callus around the wound which requires debridement however this does not appear to be a probing area. Culture I did last week show coag-negative staph. I thought this was probably not something that was worth treating but In the interim he was seen by his primary care with parotitis. He is now on clindamycin and Cipro and the Staphylococcus we cultured is  Cipro sensitive [lugdunensis} 04/27/17; plantar left first toe. Again a lot of circumferential callus around a small open area. Removing the callus also nonviable subcutaneous tissue. The wound looks better today. He has completed his antibiotics for the parotitis. I didn't think additional antibiotics were necessary in terms of the wound 05/11/17; plantar first toe. I thought this might be closed this week however once again he has a deep probing small hole. Using pickups and a scalpel debridement of thick callus and subcutaneous tissue from around the wound reveals a depth of about 0.4 cm. We have not been able to offload the area. There is no obvious infection here. No evidence of ischemia 05/18/17; since his last visit he's been in hospital for oral surgery apparently to remove a retained surgical screw. He has swelling of his jaw and has some discomfort. He arrives today with the plantar left first toe in the same predicament. No major change. Each time he comes in here he has thick adherent callus 05/25/17; arrives today with roughly the same condition of the plantar left great toe. Perhaps somewhat better less callus. There are no options for offloading. He is on his feet a lot and I think this is largely a pressure issue X-ray I did last week showed no evidence of osteomyelitis. He has a toe separator as the great toe tends to ride under the second toe when he is walking. I'm  hopeful that this will allow for a little less pressure on the plantar aspect of the left great toe 06/07/2017 -- he is working hard on giving up smoking and other than that has been wearing his diabetic shoes and is also trying to offload as much as possible. 06/15/17; patient arrived in clinic today with a difficult area on his left medial great toe closed over. There is still amount of callus on this however I don't believe there is any underlying open wound. He has custom shoes with inserts. I still think he is going to need  to offload this area going forward. He also follows with Dr. Amalia Hailey of podiatry for routine foot care READMISSION 11/01/16; patient arrives in clinic today with a five-day history of a new area on the previously problematic left great toe. He is wearing his diabetic shoes and he apparently has a new pair coming. The patient is a type II diabetic with neuropathy. He follows with Dr. Amalia Hailey of podiatry for pre-ulcerative callus on his bilateral feet last seen on 08/24/17. He has developed a large blister on top of his left great toe this is since gone down somewhat. He is not been dressing this specifically. He does not have a history of significant PAD previous ABIs in this clinic were1.1 11/06/17; left great toe has epithelialized since last time. I think this was caused by a insoles an issue pushing the toe against the dorsal aspect of the shoe itself has new diabetic shoes coming hopefully will have more forefoot depth. READMISSION 03/01/2022 This is a now 71 year old man with a past medical history significant for type 2 diabetes mellitus with end-stage renal disease on hemodialysis as as well as congestive heart failure, ischemic cardiomyopathy, hypertension, and prior diabetic foot ulcers. He has had multiple issues with his hemodialysis access. He had a prior left radiocephalic fistula that failed. In October 2020, he had a left brachiobasilic fistula. He apparently underwent a procedure at an outside vascular office on November 09, 2021. Subsequent to that, he developed a wound on his left posterior forearm just distal to the elbow as well as wounds on 2 of his left digits. He was diagnosed with steal syndrome and recently underwent ligation of his fistula with placement of a hemodialysis catheter. He is here today for assistance in healing the wounds that developed related to steal syndrome. He says that he has been applying Neosporin to the sites and they are fairly sensitive to the  touch. 03/08/2022: The wounds on his fingers are much smaller and have just a little bit of accumulated thin eschar. The wound on his left posterior forearm is also smaller but has accumulated both slough and eschar. He also has a thick callus on his right fifth metatarsal head plantar surface and unfortunately, it appears that there is an open wound underneath the callus. We have been using Iodosorb with Hydrofera Blue to all of his open wounds. Pain is much improved from last week. 03/15/2022: The wounds on his fingers have healed. The wound on his left posterior forearm is smaller and has a nice layer of granulation tissue with just a bit of accumulated slough. He has built up additional callus around the right fifth metatarsal head plantar surface, but the underlying wound has a good surface with granulation tissue present. We have been using Iodosorb with Hydrofera Blue. He did not endorse any pain this week. 03/22/2022: The wound on his left posterior forearm continues to contract. There is good granulation tissue  present and just a little bit of periwound eschar and overlying slough. The right fifth metatarsal head wound has once again accumulated some callus and there is slough within the wound. The intake nurse measured a area of undermining from 7-10 o'clock. 03/29/2022: The patient saw podiatry this week for nail trim and they also pared back a number of calluses. The wound on his left posterior forearm is substantially smaller with just some overlying thin eschar. The right fifth metatarsal head has a bit of callus accumulation as well as some slough in the wound bed but is otherwise clean. It is smaller today, as well. 04/05/2022: The wound on his left posterior forearm is nearly closed with just a small amount of overlying eschar. The right fifth metatarsal head wound is smaller again this week. There is some periwound callus accumulation and some slough in the wound bed. He is going to get  new dialysis access placed next week. Patient History Information obtained from Patient. Family History Diabetes - Siblings,Father, Heart Disease - Father,Siblings, Hypertension - Father,Siblings, No family history of Cancer, Hereditary Spherocytosis, Kidney Disease, Lung Disease, Seizures, Stroke, Thyroid Problems, Tuberculosis. Social History Current every day smoker - 1/2 ppd / 84yr, Marital Status - Single, Alcohol Use - Moderate - 6 standard drinks per week, Drug Use - Prior History - Polysubstance Abuse; clean for 9 years, Caffeine Use - Rarely - tea. Medical History Eyes Patient has history of Cataracts - mild, Glaucoma Denies history of Optic Neuritis Ear/Nose/Mouth/Throat Denies history of Chronic sinus problems/congestion, Middle ear problems Hematologic/Lymphatic Patient has history of Anemia Denies history of Hemophilia, Human Immunodeficiency Virus, Lymphedema, Sickle Cell Disease Respiratory Patient has history of Chronic Obstructive Pulmonary Disease (COPD) Denies history of Aspiration, Asthma, Pneumothorax, Sleep Apnea, Tuberculosis Cardiovascular Patient has history of Congestive Heart Failure, Coronary Artery Disease, Hypertension, Myocardial Infarction - 2004 Denies history of Angina, Arrhythmia, Deep Vein Thrombosis, Hypotension, Peripheral Arterial Disease, Peripheral Venous Disease, Phlebitis, Vasculitis Gastrointestinal Patient has history of Hepatitis C - 1968 Denies history of Cirrhosis , Colitis, Crohnoos, Hepatitis A, Hepatitis B Endocrine Patient has history of Type II Diabetes Denies history of Type I Diabetes Genitourinary Denies history of End Stage Renal Disease Immunological Denies history of Lupus Erythematosus, Raynaudoos, Scleroderma Integumentary (Skin) Denies history of History of Burn Musculoskeletal Patient has history of Gout, Osteoarthritis Denies history of Rheumatoid Arthritis, Osteomyelitis Neurologic Patient has history of  Neuropathy Denies history of Dementia, Quadriplegia, Paraplegia, Seizure Disorder Oncologic Denies history of Received Chemotherapy, Received Radiation Psychiatric Denies history of Anorexia/bulimia, Confinement Anxiety Hospitalization/Surgery History - Colonoscopy. - CABG. - Laproscopic Cholecystectomy. - Left Hip Fx. - Left Jaw Fx. - Lumbar Laminectomy. Medical A Surgical History Notes nd Constitutional Symptoms (General Health) Insomnia Syncope Alcohol Dependence HIV Hematologic/Lymphatic Hyperlipidemia Hyperglycemia Cardiovascular CABG, ischemic cardiomyopathy Gastrointestinal GERD Genitourinary Syphilis CKD,Nocturia Immunological HIV Musculoskeletal Hip Fx BiLateral Bunions Unspecified Arthritis Neurologic chronic back pain Objective Constitutional No acute distress.. Vitals Time Taken: 12:43 PM, Height: 74 in, Weight: 172 lbs, BMI: 22.1, Temperature: 97.5 F, Pulse: 78 bpm, Respiratory Rate: 16 breaths/min, Blood Pressure: 134/64 mmHg. Respiratory Normal work of breathing on room air.. General Notes: 04/05/2022: The wound on his left posterior forearm is nearly closed with just a small amount of overlying eschar. The right fifth metatarsal head wound is smaller again this week. There is some periwound callus accumulation and some slough in the wound bed. Integumentary (Hair, Skin) Wound #7 status is Open. Original cause of wound was Blister. The date acquired was: 11/09/2021. The  wound has been in treatment 5 weeks. The wound is located on the Left,Posterior Upper Arm. The wound measures 0.2cm length x 0.1cm width x 0.1cm depth; 0.016cm^2 area and 0.002cm^3 volume. There is Fat Layer (Subcutaneous Tissue) exposed. There is no tunneling or undermining noted. There is a medium amount of serosanguineous drainage noted. The wound margin is flat and intact. There is large (67-100%) red granulation within the wound bed. There is no necrotic tissue within the wound bed. Wound #8  status is Open. Original cause of wound was Blister. The date acquired was: 03/08/2022. The wound has been in treatment 4 weeks. The wound is located on the [place]. The wound measures 0.3cm length x 0.3cm width x 0.2cm depth; 0.071cm^2 area and 0.014cm^3 volume. There is Fat Layer (Subcutaneous Tissue) exposed. There is no tunneling or undermining noted. There is a medium amount of serosanguineous drainage noted. The wound margin is thickened. There is large (67-100%) red granulation within the wound bed. There is a small (1-33%) amount of necrotic tissue within the wound bed including Adherent Slough. Assessment Active Problems ICD-10 Non-pressure chronic ulcer of skin of other sites with unspecified severity Type 2 diabetes mellitus with diabetic chronic kidney disease Chronic obstructive pulmonary disease, unspecified Chronic combined systolic (congestive) and diastolic (congestive) heart failure Essential (primary) hypertension Secondary hyperparathyroidism of renal origin End stage renal disease Human immunodeficiency virus [HIV] disease Non-pressure chronic ulcer of other part of right foot with fat layer exposed Procedures Wound #7 Pre-procedure diagnosis of Wound #7 is an Abrasion located on the Left,Posterior Upper Arm . There was a Selective/Open Wound Non-Viable Tissue Debridement with a total area of 0.02 sq cm performed by Fredirick Maudlin, MD. With the following instrument(s): Curette to remove Non-Viable tissue/material. Material removed includes Eschar after achieving pain control using Other (Benzocaine 20%). No specimens were taken. A time out was conducted at 13:03, prior to the start of the procedure. A Minimum amount of bleeding was controlled with Pressure. The procedure was tolerated well with a pain level of 0 throughout and a pain level of 0 following the procedure. Post Debridement Measurements: 0.2cm length x 0.1cm width x 0.1cm depth; 0.002cm^3  volume. Character of Wound/Ulcer Post Debridement is improved. Post procedure Diagnosis Wound #7: Same as Pre-Procedure Wound #8 Pre-procedure diagnosis of Wound #8 is a Diabetic Wound/Ulcer of the Lower Extremity located on the Right,Plantar Foot .Severity of Tissue Pre Debridement is: Fat layer exposed. There was a Selective/Open Wound Non-Viable Tissue Debridement with a total area of 0.09 sq cm performed by Fredirick Maudlin, MD. With the following instrument(s): Curette to remove Non-Viable tissue/material. Material removed includes Callus and Slough and after achieving pain control using Other (Benzocaine 20%). No specimens were taken. A time out was conducted at 13:03, prior to the start of the procedure. A Minimum amount of bleeding was controlled with Pressure. The procedure was tolerated well with a pain level of 0 throughout and a pain level of 0 following the procedure. Post Debridement Measurements: 0.3cm length x 0.3cm width x 0.2cm depth; 0.014cm^3 volume. Character of Wound/Ulcer Post Debridement is improved. Severity of Tissue Post Debridement is: Fat layer exposed. Post procedure Diagnosis Wound #8: Same as Pre-Procedure Plan Follow-up Appointments: Return Appointment in 2 weeks. - Dr Celine Ahr Room 3 June 27th at [facility]: Other Bathing/Shower/Hygiene Orders/Instructions: - Change dressing after bathing WOUND #7: - Upper Arm Wound Laterality: Left, Posterior Cleanser: Soap and Water Discharge Instructions: May shower and wash wound with dial antibacterial soap  and water prior to dressing change. Cleanser: Wound Cleanser (Generic) Discharge Instructions: Cleanse the wound with wound cleanser prior to applying a clean dressing using gauze sponges, not tissue or cotton balls. Prim Dressing: Hydrofera Blue Ready Foam, 2.5 x2.5 in (Generic) ary Discharge Instructions: Apply to wound bed as instructed Secondary Dressing: Bordered Gauze, 4x4 in  (Generic) Discharge Instructions: Apply over primary dressing as directed. WOUND #8: - Foot Wound Laterality: Plantar, Right Cleanser: Soap and Water Discharge Instructions: May shower and wash wound with dial antibacterial soap and water prior to dressing change. Cleanser: Wound Cleanser (Generic) Discharge Instructions: Cleanse the wound with wound cleanser prior to applying a clean dressing using gauze sponges, not tissue or cotton balls. Prim Dressing: Hydrofera Blue Ready Foam, 2.5 x2.5 in (Generic) ary Discharge Instructions: Apply to wound bed as instructed Secondary Dressing: Bordered Gauze, 4x4 in (Generic) Discharge Instructions: Apply over primary dressing as directed. 04/05/2022: The wound on his left posterior forearm is nearly closed with just a small amount of overlying eschar. The right fifth metatarsal head wound is smaller again this week. There is some periwound callus accumulation and some slough in the wound bed. I used a curette to debride the eschar from his elbow wound, as well as the periwound callus and slough from his plantar foot wound. We will continue using Hydrofera Blue to both sites. As he is having procedures done next week, I will see him in 2 weeks' time. Electronic Signature(s) Signed: 04/05/2022 1:23:04 PM By: Fredirick Maudlin MD FACS Entered By: Fredirick Maudlin on 04/05/2022 13:23:04 -------------------------------------------------------------------------------- HxROS Details Patient Name: Date of Service: Jacob Katos R. 04/05/2022 12:30 PM Medical Record Number: [id number] Patient Account Number: [id number] Date of Birth/Sex: Treating RN: 02-04-1951 (71 y.o. Jacob [name] Primary Care Provider: Alain Honey Other Clinician: Referring Provider: Treating Provider/Extender: Neil Crouch in Treatment: 5 Information Obtained From Patient Constitutional Symptoms (General Health) Medical History: Past Medical  History Notes: Insomnia Syncope Alcohol Dependence HIV Eyes Medical History: Positive for: Cataracts - mild; Glaucoma Negative for: Optic Neuritis Ear/Nose/Mouth/Throat Medical History: Negative for: Chronic sinus problems/congestion; Middle ear problems Hematologic/Lymphatic Medical History: Positive for: Anemia Negative for: Hemophilia; Human Immunodeficiency Virus; Lymphedema; Sickle Cell Disease Past Medical History Notes: Hyperlipidemia Hyperglycemia Respiratory Medical History: Positive for: Chronic Obstructive Pulmonary Disease (COPD) Negative for: Aspiration; Asthma; Pneumothorax; Sleep Apnea; Tuberculosis Cardiovascular Medical History: Positive for: Congestive Heart Failure; Coronary Artery Disease; Hypertension; Myocardial Infarction - 2004 Negative for: Angina; Arrhythmia; Deep Vein Thrombosis; Hypotension; Peripheral Arterial Disease; Peripheral Venous Disease; Phlebitis; Vasculitis Past Medical History Notes: CABG, ischemic cardiomyopathy Gastrointestinal Medical History: Positive for: Hepatitis C - 1968 Negative for: Cirrhosis ; Colitis; Crohns; Hepatitis A; Hepatitis B Past Medical History Notes: GERD Endocrine Medical History: Positive for: Type II Diabetes Negative for: Type I Diabetes Time with diabetes: 13 yrs Treated with: Insulin Blood sugar tested every day: Yes Tested : 2 times per day Blood sugar testing results: Breakfast: 100; Dinner: 95-126 Genitourinary Medical History: Negative for: End Stage Renal Disease Past Medical History Notes: Syphilis CKD,Nocturia Immunological Medical History: Negative for: Lupus Erythematosus; Raynauds; Scleroderma Past Medical History Notes: HIV Integumentary (Skin) Medical History: Negative for: History of Burn Musculoskeletal Medical History: Positive for: Gout; Osteoarthritis Negative for: Rheumatoid Arthritis; Osteomyelitis Past Medical History Notes: Hip Fx BiLateral Bunions Unspecified  Arthritis Neurologic Medical History: Positive for: Neuropathy Negative for: Dementia; Quadriplegia; Paraplegia; Seizure Disorder Past Medical History Notes: chronic back pain Oncologic Medical History: Negative for: Received Chemotherapy; Received Radiation  Psychiatric Medical History: Negative for: Anorexia/bulimia; Confinement Anxiety HBO Extended History Items Eyes: Eyes: Cataracts Glaucoma Immunizations Pneumococcal Vaccine: Received Pneumococcal Vaccination: Yes Received Pneumococcal Vaccination On or After 60th Birthday: Yes Implantable Devices No devices added Hospitalization / Surgery History Type of Hospitalization/Surgery Colonoscopy CABG Laproscopic Cholecystectomy Left Hip Fx Left Jaw Fx Lumbar Laminectomy Family and Social History Cancer: No; Diabetes: Yes - Siblings,Father; Heart Disease: Yes - Father,Siblings; Hereditary Spherocytosis: No; Hypertension: Yes - Father,Siblings; Kidney Disease: No; Lung Disease: No; Seizures: No; Stroke: No; Thyroid Problems: No; Tuberculosis: No; Current every day smoker - 1/2 ppd / 38yr; Marital Status - Single; Alcohol Use: Moderate - 6 standard drinks per week; Drug Use: Prior History - Polysubstance Abuse; clean for 9 years; Caffeine Use: Rarely - tea; Financial Concerns: No; Food, Clothing or Shelter Needs: No; Support System Lacking: No; Transportation Concerns: No Electronic Signature(s) Signed: 04/05/2022 2:54:13 PM By: CFredirick MaudlinMD FACS Signed: 04/05/2022 5:25:18 PM By: SDellie CatholicRN Entered By: CFredirick Maudlinon 04/05/2022 13:21:26 -------------------------------------------------------------------------------- SuperBill Details Patient Name: Date of Service: NRaechel [name] 04/05/2022 Medical Record Number: [id number] Account Number: [id number] of Birth/Sex: Treating RN: [birth year](72y.o. MCollene GobblePrimary Care Provider: MAlain HoneyOther Clinician: Referring  Provider: Treating Provider/Extender: CNeil Crouchin Treatment: 5 Diagnosis Coding ICD-10 Codes Code Description [id number]-pressure chronic ulcer of skin of other sites with unspecified severity E11.22 Type 2 diabetes mellitus with diabetic chronic kidney disease J44.9 Chronic obstructive pulmonary disease, unspecified I50.42 Chronic combined systolic (congestive) and diastolic (congestive) heart failure I10 Essential (primary) hypertension N25.81 Secondary hyperparathyroidism of renal origin N18.6 End stage renal disease B20 Human immunodeficiency virus [HIV] disease L97.512 Non-pressure chronic ulcer of other part of right foot with fat layer exposed Facility Procedures CPT4 Code: [id number]: [id number]- DEBRIDE WOUND 1ST 20 SQ CM OR < ICD-10 Diagnosis Description L98.499 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 Modifier: Quantity: 1 Physician Procedures : CPT4 Code Description Modifier [id number] 50277- WC PHYS LEVEL 3 - EST PT 25 ICD-10 Diagnosis Description L98.499 Non-pressure chronic ulcer of skin of other sites with unspecified severity L97.512 Non-pressure chronic ulcer of other part of right foot  with fat layer exposed E11.22 Type 2 diabetes mellitus with diabetic chronic kidney disease N18.6 End stage renal disease Quantity: 1 : [id number]- WC PHYS DEBR WO ANESTH 20 SQ CM ICD-10 Diagnosis Description L98.499 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 Quantity: 1 Electronic Signature(s) Signed: 04/05/2022 1:23:38 PM By: CFredirick MaudlinMD FACS Entered By: CFredirick Maudlinon 04/05/2022 13:23:38

## 2022-04-06 DIAGNOSIS — Z992 Dependence on renal dialysis: Secondary | ICD-10-CM | POA: Diagnosis not present

## 2022-04-06 DIAGNOSIS — D631 Anemia in chronic kidney disease: Secondary | ICD-10-CM | POA: Diagnosis not present

## 2022-04-06 DIAGNOSIS — N2581 Secondary hyperparathyroidism of renal origin: Secondary | ICD-10-CM | POA: Diagnosis not present

## 2022-04-06 DIAGNOSIS — T8249XA Other complication of vascular dialysis catheter, initial encounter: Secondary | ICD-10-CM | POA: Diagnosis not present

## 2022-04-06 DIAGNOSIS — N186 End stage renal disease: Secondary | ICD-10-CM | POA: Diagnosis not present

## 2022-04-08 DIAGNOSIS — N2581 Secondary hyperparathyroidism of renal origin: Secondary | ICD-10-CM | POA: Diagnosis not present

## 2022-04-08 DIAGNOSIS — D631 Anemia in chronic kidney disease: Secondary | ICD-10-CM | POA: Diagnosis not present

## 2022-04-08 DIAGNOSIS — T8249XA Other complication of vascular dialysis catheter, initial encounter: Secondary | ICD-10-CM | POA: Diagnosis not present

## 2022-04-08 DIAGNOSIS — N186 End stage renal disease: Secondary | ICD-10-CM | POA: Diagnosis not present

## 2022-04-08 DIAGNOSIS — Z992 Dependence on renal dialysis: Secondary | ICD-10-CM | POA: Diagnosis not present

## 2022-04-11 ENCOUNTER — Telehealth: Payer: Self-pay

## 2022-04-11 ENCOUNTER — Encounter (HOSPITAL_COMMUNITY): Payer: Self-pay | Admitting: Vascular Surgery

## 2022-04-11 ENCOUNTER — Other Ambulatory Visit: Payer: Self-pay

## 2022-04-11 DIAGNOSIS — Z992 Dependence on renal dialysis: Secondary | ICD-10-CM | POA: Diagnosis not present

## 2022-04-11 DIAGNOSIS — N2581 Secondary hyperparathyroidism of renal origin: Secondary | ICD-10-CM | POA: Diagnosis not present

## 2022-04-11 DIAGNOSIS — D631 Anemia in chronic kidney disease: Secondary | ICD-10-CM | POA: Diagnosis not present

## 2022-04-11 DIAGNOSIS — T8249XA Other complication of vascular dialysis catheter, initial encounter: Secondary | ICD-10-CM | POA: Diagnosis not present

## 2022-04-11 DIAGNOSIS — N186 End stage renal disease: Secondary | ICD-10-CM | POA: Diagnosis not present

## 2022-04-11 NOTE — Progress Notes (Signed)
Anesthesia Chart Review:  Pt is a same day work up   Case: [id number] Date/Time: 04/12/22 1012   Procedure: RIGHT UPPER EXTREMITY ARTERIOVENOUS (AV) FISTULA CREATION VERSUS GRAFT (Right)   Anesthesia type: Choice   Pre-op diagnosis: END STAGE RENAL DISEASE   Location: [place] OR ROOM 12 / [facility]   Surgeons: Angelia Mould, MD       DISCUSSION: Pt is 71 years old with hx HIV, ischemic cardiomyopathy (EF 40-45% by 2021 echo), CHF, CAD (s/p CABG x2 2004; s/p DES to VG-RI 2021), HTN, DM, COPD, ESRD on hemodialysis, GI bleeding requiring transfusions - recurrent due to AVMs   PROVIDERS: - PCP is [name], MD - Cardiologist is Jenkins Rouge, MD. Last office visit 09/21/21  LABS: Will be obtained day of surgery  - HbA1c 03/22/22: 5.9   IMAGES: 1 view CXR 02/08/22:  1. No complicating features of Perma catheter placement. 2. Vascular congestion.  EKG 01/21/22: Sinus rhythm. RBBB. Inferolateral infarct, age indeterminate. No significant change was found   CV: TTE 10/18/2020: 1. Left ventricular ejection fraction, by estimation, is 40 to 45%. The left ventricle has mildly decreased function. The left ventricle demonstrates regional wall motion abnormalities (see scoring diagram/findings for description). The left ventricular  internal cavity size was moderately dilated. There is moderate concentric left ventricular hypertrophy. Left ventricular diastolic parameters are consistent with Grade II diastolic dysfunction (pseudonormalization). Elevated left atrial pressure.  2. Right ventricular systolic function is normal. The right ventricular size is normal. There is normal pulmonary artery systolic pressure.  3. Left atrial size was moderately dilated.  4. The mitral valve is normal in structure. Mild mitral valve regurgitation. No evidence of mitral stenosis.  5. The aortic valve is normal in structure. Aortic valve regurgitation is not visualized. No aortic stenosis is present.  6.  The inferior vena cava is dilated in size with >50% respiratory variability, suggesting right atrial pressure of 8 mmHg.     Cath and PCI 12/19/2019: Prox RCA lesion is 100% stenosed. SVG to RCA is widely patent. Dist LAD lesion is 90% stenosed. Too small and distal for PCI. 3rd Diag lesion is 50% stenosed. Prox Cx lesion is 100% stenosed. Left to left collaterals. Ramus lesion is 100% stenosed. SVG to ramus with Origin to Prox Graft lesion is 95% stenosed. A drug-eluting stent was successfully placed using a SYNERGY XD 3.50X48, postdilated to >4 mm. Optimized with IVUS. Post intervention, there is a 0% residual stenosis. There is moderate to severe left ventricular systolic dysfunction. There is no aortic valve stenosis. Hemodynamic findings consistent with moderate pulmonary hypertension.    Past Medical History:  Diagnosis Date   Acute respiratory failure ([place]) 03/01/2018   Anemia    Arthritis    "all over; mostly knees and back" (02/28/2018)   Chronic combined systolic and diastolic CHF (congestive heart failure) (HCC)    Chronic lower back pain    stenosis   Community acquired pneumonia 09/06/2013   COPD (chronic obstructive pulmonary disease) ([place])    Coronary atherosclerosis of native coronary artery    a. 02/2003 s/p CABG x 2 (VG->RI, VG->RPDA; b. 11/2019 PCI: LM nl, LAD 90d, D3 50, RI 100, LCX 100p, OM3 100 - fills via L->L collats from D2/dLAD, RCA 100p, VG->RPDA ok, VG->RI 95 (3.5x48 Synergy XD DES).   Drug abuse ([place])    hx; tested for cocaine as recently as 2/08. says he is not using drugs now - avoided defib. for this reason  ESRD (end stage renal disease) ([place])    Hemo M-W-F- Richarda Blade   Fall at home 10/2020   GERD (gastroesophageal reflux disease)    takes OTC meds as needed   GI bleeding    a. 11/2019 EGD: angiodysplastic lesions w/ bleeding s/p argon plasma/clipping/epi inj. Multiple admissions for the same.   Glaucoma    uses eye drops daily   Hepatitis B 1968    "tx'd w/isolation; caught it from toilet stools in gym"   History of blood transfusion 03/01/2019   History of colon polyps    benign   History of gout    takes Allopurinol daily as well as Colchicine-if needed (02/28/2018)   History of kidney stones    HTN (hypertension)    takes Coreg,Imdur.and Apresoline daily   Human immunodeficiency virus (HIV) disease ([place]) dx'd 1995   on [place] as of 12/2020.     Hyperlipidemia    Ischemic cardiomyopathy    a. 01/2019 Echo: EF 40-45%, diffuse HK, mild basal septal hypertrophy. Diast dysfxn. Nl RV size/fxn. Sev dil LA. Triv MR/TR/PR.   Muscle spasm    takes Zanaflex as needed   Myocardial infarction ([place]) ~ 2004/2005   Nocturia    Peripheral neuropathy    takes gabapentin daily   Pneumonia    "at least twice" (02/28/2018)   SDH (subdural hematoma) (HCC)    Syphilis, unspecified    Type II diabetes mellitus ([place]) 2004   Lantus daily.Average fasting blood sugar 125-199   Wears glasses    Wears partial dentures     Past Surgical History:  Procedure Laterality Date   AV FISTULA PLACEMENT Left 08/02/2018   Procedure: ARTERIOVENOUS (AV) FISTULA CREATION  left arm radiocephlic;  Surgeon: Marty Heck, MD;  Location: [place];  Service: Vascular;  Laterality: Left;   AV FISTULA PLACEMENT Left 08/01/2019   Procedure: LEFT BRACHIOCEPHALIC ARTERIOVENOUS (AV) FISTULA CREATION;  Surgeon: [name], MD;  Location: [place];  Service: Vascular;  Laterality: Left;   [place] Left 10/03/2019   Procedure: BASILIC VEIN TRANSPOSITION LEFT SECOND STAGE;  Surgeon: [name], MD;  Location: [place];  Service: Vascular;  Laterality: Left;   BIOPSY  01/25/2021   Procedure: BIOPSY;  Surgeon: Doran Stabler, MD;  Location: [facility] ENDOSCOPY;  Service: Gastroenterology;;   CARDIAC CATHETERIZATION  10/2002; 12/19/2004   [name] 03/08/2011   COLONOSCOPY  2013   [place]    CORONARY ARTERY BYPASS GRAFT  02/24/2003   CABG X2/notes 03/08/2011    CORONARY STENT INTERVENTION N/A 12/19/2019   Procedure: CORONARY STENT INTERVENTION;  Surgeon: Jettie Booze, MD;  Location: [place] CV LAB;  Service: Cardiovascular;  Laterality: N/A;   ENTEROSCOPY N/A 01/25/2021   Procedure: ENTEROSCOPY;  Surgeon: Doran Stabler, MD;  Location: [place];  Service: Gastroenterology;  Laterality: N/A;   ENTEROSCOPY N/A 02/13/2021   Procedure: ENTEROSCOPY;  Surgeon: Jackquline Denmark, MD;  Location: [facility] ENDOSCOPY;  Service: Endoscopy;  Laterality: N/A;   ENTEROSCOPY N/A 05/07/2021   Procedure: ENTEROSCOPY;  Surgeon: Yetta Flock, MD;  Location: [facility] ENDOSCOPY;  Service: Gastroenterology;  Laterality: N/A;   ESOPHAGOGASTRODUODENOSCOPY (EGD) WITH PROPOFOL N/A 02/08/2019   Procedure: ESOPHAGOGASTRODUODENOSCOPY (EGD) WITH PROPOFOL;  Surgeon: Milus Banister, MD;  Location: [place];  Service: Gastroenterology;  Laterality: N/A;   ESOPHAGOGASTRODUODENOSCOPY (EGD) WITH PROPOFOL N/A 12/22/2019   Procedure: ESOPHAGOGASTRODUODENOSCOPY (EGD) WITH PROPOFOL;  Surgeon: Lavena Bullion, DO;  Location: [place];  Service: Gastroenterology;  Laterality:  N/A;   ESOPHAGOGASTRODUODENOSCOPY (EGD) WITH PROPOFOL N/A 10/19/2020   Procedure: ESOPHAGOGASTRODUODENOSCOPY (EGD) WITH PROPOFOL;  Surgeon: Jackquline Denmark, MD;  Location: [name] ENDOSCOPY;  Service: Endoscopy;  Laterality: N/A;   ESOPHAGOGASTRODUODENOSCOPY (EGD) WITH PROPOFOL N/A 12/22/2020   Procedure: ESOPHAGOGASTRODUODENOSCOPY (EGD) WITH PROPOFOL;  Surgeon: Gatha Mayer, MD;  Location: [place];  Service: Endoscopy;  Laterality: N/A;   ESOPHAGOGASTRODUODENOSCOPY (EGD) WITH PROPOFOL N/A 01/09/2021   Procedure: ESOPHAGOGASTRODUODENOSCOPY (EGD) WITH PROPOFOL;  Surgeon: Irene Shipper, MD;  Location: [facility] ENDOSCOPY;  Service: Endoscopy;  Laterality: N/A;   HEMOSTASIS CLIP PLACEMENT  12/22/2019   Procedure: HEMOSTASIS CLIP PLACEMENT;  Surgeon: Lavena Bullion, DO;  Location: [place] ENDOSCOPY;  Service:  Gastroenterology;;   HEMOSTASIS CLIP PLACEMENT  12/22/2020   Procedure: HEMOSTASIS CLIP PLACEMENT;  Surgeon: Gatha Mayer, MD;  Location: [facility] ENDOSCOPY;  Service: Endoscopy;;   HEMOSTASIS CONTROL  12/22/2020   Procedure: HEMOSTASIS CONTROL/hemospray;  Surgeon: Gatha Mayer, MD;  Location: [place];  Service: Endoscopy;;   HOT HEMOSTASIS N/A 02/08/2019   Procedure: HOT HEMOSTASIS (ARGON PLASMA COAGULATION/BICAP);  Surgeon: Milus Banister, MD;  Location: [facility] ENDOSCOPY;  Service: Gastroenterology;  Laterality: N/A;   HOT HEMOSTASIS N/A 12/22/2019   Procedure: HOT HEMOSTASIS (ARGON PLASMA COAGULATION/BICAP);  Surgeon: Lavena Bullion, DO;  Location: [place] ENDOSCOPY;  Service: Gastroenterology;  Laterality: N/A;   HOT HEMOSTASIS N/A 10/19/2020   Procedure: HOT HEMOSTASIS (ARGON PLASMA COAGULATION/BICAP);  Surgeon: Jackquline Denmark, MD;  Location: [facility] ENDOSCOPY;  Service: Endoscopy;  Laterality: N/A;   HOT HEMOSTASIS N/A 12/22/2020   Procedure: HOT HEMOSTASIS (ARGON PLASMA COAGULATION/BICAP);  Surgeon: Gatha Mayer, MD;  Location: [facility] ENDOSCOPY;  Service: Endoscopy;  Laterality: N/A;   HOT HEMOSTASIS N/A 01/09/2021   Procedure: HOT HEMOSTASIS (ARGON PLASMA COAGULATION/BICAP);  Surgeon: Irene Shipper, MD;  Location: [facility] ENDOSCOPY;  Service: Endoscopy;  Laterality: N/A;   HOT HEMOSTASIS N/A 01/25/2021   Procedure: HOT HEMOSTASIS (ARGON PLASMA COAGULATION/BICAP);  Surgeon: Doran Stabler, MD;  Location: [place];  Service: Gastroenterology;  Laterality: N/A;   HOT HEMOSTASIS N/A 02/13/2021   Procedure: HOT HEMOSTASIS (ARGON PLASMA COAGULATION/BICAP);  Surgeon: Jackquline Denmark, MD;  Location: [facility] ENDOSCOPY;  Service: Endoscopy;  Laterality: N/A;   HOT HEMOSTASIS N/A 05/07/2021   Procedure: HOT HEMOSTASIS (ARGON PLASMA COAGULATION/BICAP);  Surgeon: Yetta Flock, MD;  Location: [facility] ENDOSCOPY;  Service: Gastroenterology;  Laterality: N/A;   INSERTION OF DIALYSIS CATHETER N/A 02/08/2022   Procedure:  INSERTION OF TUNNELED DIALYSIS CATHETER;  Surgeon: Angelia Mould, MD;  Location: [place];  Service: Vascular;  Laterality: N/A;   INTERTROCHANTERIC HIP FRACTURE SURGERY Left 11/2006   [name] 03/08/2011   INTRAVASCULAR ULTRASOUND/IVUS N/A 12/19/2019   Procedure: Intravascular Ultrasound/IVUS;  Surgeon: Jettie Booze, MD;  Location: [place] CV LAB;  Service: Cardiovascular;  Laterality: N/A;   IR FLUORO GUIDE CV LINE LEFT  03/14/2022   IR FLUORO GUIDE CV LINE RIGHT  07/24/2019   IR FLUORO GUIDE CV LINE RIGHT  07/30/2019   IR US GUIDE VASC ACCESS RIGHT  07/24/2019   IR US GUIDE VASC ACCESS RIGHT  07/30/2019   LAPAROSCOPIC CHOLECYSTECTOMY  05/2006   LIGATION OF ARTERIOVENOUS  FISTULA Left 02/08/2022   Procedure: LIGATION OF LEFT ARTERIOVENOUS  FISTULA;  Surgeon: Angelia Mould, MD;  Location: [place];  Service: Vascular;  Laterality: Left;   LIGATION OF COMPETING BRANCHES OF ARTERIOVENOUS FISTULA Left 11/05/2018   Procedure: LIGATION OF COMPETING BRANCHES OF ARTERIOVENOUS FISTULA  LEFT  ARM;  Surgeon:  Marty Heck, MD;  Location: [facility] OR;  Service: Vascular;  Laterality: Left;   LUMBAR LAMINECTOMY/DECOMPRESSION MICRODISCECTOMY N/A 02/29/2016   Procedure: Left L4-5 Lateral Recess Decompression, Removal Extradural Intraspinal Facet Cyst;  Surgeon: Marybelle Killings, MD;  Location: [place];  Service: Orthopedics;  Laterality: N/A;   MULTIPLE TOOTH EXTRACTIONS     ORIF MANDIBULAR FRACTURE Left 08/13/2004   ORIF of left body fracture mandible with KLS Martin 2.3-mm six hole/notes 03/08/2011   RIGHT/LEFT HEART CATH AND CORONARY/GRAFT ANGIOGRAPHY N/A 12/19/2019   Procedure: RIGHT/LEFT HEART CATH AND CORONARY/GRAFT ANGIOGRAPHY;  Surgeon: Jettie Booze, MD;  Location: [place] CV LAB;  Service: Cardiovascular;  Laterality: N/A;   SCLEROTHERAPY  12/22/2019   Procedure: SCLEROTHERAPY;  Surgeon: Lavena Bullion, DO;  Location: [facility] ENDOSCOPY;  Service: Gastroenterology;;   Clide Deutscher   02/13/2021   Procedure: Clide Deutscher;  Surgeon: Jackquline Denmark, MD;  Location: [facility] ENDOSCOPY;  Service: Endoscopy;;    MEDICATIONS: No current facility-administered medications for this encounter.    acetaminophen (TYLENOL) 650 MG CR tablet   allopurinol (ZYLOPRIM) 100 MG tablet   ANORO ELLIPTA 62.5-25 MCG/INH AEPB   atorvastatin (LIPITOR) 10 MG tablet   BIKTARVY 50-200-25 MG TABS tablet   Calcium Acetate 667 MG TABS   carvedilol (COREG) 3.125 MG tablet   colchicine 0.6 MG tablet   diclofenac Sodium (VOLTAREN) 1 % GEL   docusate sodium (COLACE) 440 MG capsule   folic acid (FOLVITE) 1 MG tablet   gabapentin (NEURONTIN) 300 MG capsule   insulin lispro (HUMALOG) 100 UNIT/ML KwikPen   isosorbide mononitrate (IMDUR) 30 MG 24 hr tablet   latanoprost (XALATAN) 0.005 % ophthalmic solution   lidocaine-prilocaine (EMLA) cream   Menthol-Camphor (TIGER BALM ARTHRITIS RUB) 11-11 % CREA   Methoxy PEG-Epoetin Beta (MIRCERA IJ)   multivitamin (RENA-VIT) TABS tablet   nitroGLYCERIN (NITROSTAT) 0.3 MG SL tablet   octreotide (SANDOSTATIN LAR DEPOT) 10 MG injection   ondansetron (ZOFRAN) 4 MG tablet   oxyCODONE-acetaminophen (PERCOCET/ROXICET) 5-325 MG tablet   polyethylene glycol (MIRALAX / GLYCOLAX) 17 g packet   timolol (TIMOPTIC) 0.5 % ophthalmic solution   B-D UF III MINI PEN NEEDLES 31G X 5 MM MISC   glucose blood (ONETOUCH ULTRA) test strip   Lancets (ONETOUCH DELICA PLUS [id number]) MISC    If labs acceptable day of surgery, I anticipate pt can proceed with surgery as scheduled.  Willeen Cass, PhD, FNP-BC [facility]/Anesthesiology Phone: [phone] 04/11/2022 1:40 PM

## 2022-04-11 NOTE — Telephone Encounter (Signed)
Incoming call received, patient is requesting order request for a scooter as he feels limited in his mobility when going to the mail box.   Patient is not sure which company he can get the scooter through. Patient states we can try [employer].

## 2022-04-11 NOTE — Anesthesia Preprocedure Evaluation (Signed)
Anesthesia Evaluation  Patient identified by MRN, date of birth, ID band Patient awake    Reviewed: Allergy & Precautions, NPO status , Patient's Chart, lab work & pertinent test results, reviewed documented beta blocker date and time   History of Anesthesia Complications Negative for: history of anesthetic complications  Airway Mallampati: III  TM Distance: >3 FB Neck ROM: Full    Dental  (+) Dental Advisory Given,    Pulmonary COPD, Current SmokerPatient did not abstain from smoking.,    breath sounds clear to auscultation       Cardiovascular hypertension, Pt. on medications and Pt. on home beta blockers + angina + CAD, + Past MI and +CHF   Rhythm:Regular  1. Left ventricular ejection fraction, by estimation, is 40 to 45%. The  left ventricle has mildly decreased function. The left ventricle  demonstrates regional wall motion abnormalities (see scoring  diagram/findings for description). The left ventricular  internal cavity size was moderately dilated. There is moderate concentric  left ventricular hypertrophy. Left ventricular diastolic parameters are  consistent with Grade II diastolic dysfunction (pseudonormalization).  Elevated left atrial pressure.  2. Right ventricular systolic function is normal. The right ventricular  size is normal. There is normal pulmonary artery systolic pressure.  3. Left atrial size was moderately dilated.  4. The mitral valve is normal in structure. Mild mitral valve  regurgitation. No evidence of mitral stenosis.  5. The aortic valve is normal in structure. Aortic valve regurgitation is  not visualized. No aortic stenosis is present.  6. The inferior vena cava is dilated in size with >50% respiratory  variability, suggesting right atrial pressure of 8 mmHg.    ? Prox RCA lesion is 100% stenosed. SVG to RCA is widely patent. ? Dist LAD lesion is 90% stenosed. Too small and distal for  PCI. ? 3rd Diag lesion is 50% stenosed. ? Prox Cx lesion is 100% stenosed. Left to left collaterals. ? Ramus lesion is 100% stenosed. ? SVG to ramus with Origin to Prox Graft lesion is 95% stenosed. ? A drug-eluting stent was successfully placed using a SYNERGY XD 3.50X48, postdilated to >4 mm. Optimized with IVUS. ? Post intervention, there is a 0% residual stenosis. ? There is moderate to severe left ventricular systolic dysfunction. ? There is no aortic valve stenosis. ? Hemodynamic findings consistent with moderate pulmonary hypertension.   Continue dual antiplatelet therapy for 6 months at least.  COnsider clopidogrel longer term if there is no interaction with his HIV meds, given the diffuse nature of CAD.     Neuro/Psych neg Seizures  Neuromuscular disease negative psych ROS   GI/Hepatic GERD  ,(+) Hepatitis -, B  Endo/Other  diabetesLab Results      Component                Value               Date                      HGBA1C                   5.9 (H)             03/22/2022             Renal/GU ESRF and DialysisRenal disease     Musculoskeletal  (+) Arthritis ,   Abdominal   Peds  Hematology  (+) Blood dyscrasia, anemia , Lab Results  Component                Value               Date                      WBC                      4.0                 02/24/2022                HGB                      12.6 (L)            04/12/2022                HCT                      37.0 (L)            04/12/2022                MCV                      103.9 (H)           02/24/2022                PLT                      125 (L)             02/24/2022              Anesthesia Other Findings   Reproductive/Obstetrics                            Anesthesia Physical Anesthesia Plan  ASA: 4  Anesthesia Plan: MAC and Regional   Post-op Pain Management: Regional block*   Induction:   PONV Risk Score and Plan: 0 and Propofol infusion and Treatment  may vary due to age or medical condition  Airway Management Planned: Nasal Cannula, Natural Airway and Simple Face Mask  Additional Equipment: None  Intra-op Plan:   Post-operative Plan:   Informed Consent:     Dental advisory given  Plan Discussed with: CRNA  Anesthesia Plan Comments: (See APP note by Durel Salts, FNP )       Anesthesia Quick Evaluation

## 2022-04-11 NOTE — Pre-Procedure Instructions (Signed)
[facility] [place], [place] - [address] [place] [address] Phone: [phone] Fax: [phone]   PCP - [name], MD Cardiologist - [name], MD  Chest x-ray - 02/08/22 EKG - 01/21/22 ECHO - 10/18/20 Cardiac Cath - 12/19/19  Fasting Blood Sugar - 140-250 Checks Blood Sugar 3/day  ERAS Protcol - N/A  Anesthesia review: Y  Patient verbally denies any shortness of breath, fever, cough and chest pain during phone call   -------------  SDW INSTRUCTIONS given:  Your procedure is scheduled on 04/12/22.  Report to [facility] Main Entrance "A" at 0800 A.M., and check in at the Admitting office.  Call this number if you have problems the morning of surgery:  [phone]   Remember:  Do not eat or drink after midnight the night before your surgery    Take these medicines the morning of surgery with A SIP OF WATER  allopurinol (ZYLOPRIM)  ANORO ELLIPTA  carvedilol (COREG)  gabapentin (NEURONTIN) isosorbide mononitrate (IMDUR) timolol (TIMOPTIC) oxyCODONE-acetaminophen (PERCOCET/ROXICET)-if needed OR acetaminophen (TYLENOL) ondansetron (ZOFRAN)-if needed nitroGLYCERIN (NITROSTAT)-if needed   No bedtime dose of Humalog  .** PLEASE check your blood sugar the morning of your surgery when you wake up and every 2 hours until you get to the Short Stay unit.  If your blood sugar is less than 70 mg/dL, you will need to treat for low blood sugar: Do not take insulin. Treat a low blood sugar (less than 70 mg/dL) with  cup of clear juice (cranberry or apple), 4 glucose tablets, OR glucose gel. Recheck blood sugar in 15 minutes after treatment (to make sure it is greater than 70 mg/dL). If your blood sugar is not greater than 70 mg/dL on recheck, call [phone] for further instructions.    If blood sugar is greater than 220 please take half of your normal dose   As of  today, STOP taking any Aspirin (unless otherwise instructed by your surgeon) Aleve, Naproxen, Ibuprofen, Motrin, Advil, Goody's, BC's, all herbal medications, fish oil, and all vitamins.                      Do not wear jewelry, make up, or nail polish            Do not wear lotions, powders, perfumes/colognes, or deodorant.            Do not shave 48 hours prior to surgery.  Men may shave face and neck.            Do not bring valuables to the hospital.            [facility] is not responsible for any belongings or valuables.  Do NOT Smoke (Tobacco/Vaping) 24 hours prior to your procedure If you use a CPAP at night, you may bring all equipment for your overnight stay.   Contacts, glasses, dentures or bridgework may not be worn into surgery.      For patients admitted to the hospital, discharge time will be determined by your treatment team.   Patients discharged the day of surgery will not be allowed to drive home, and someone needs to stay with them for 24 hours.    Special instructions:   [facility]- Preparing For Surgery  Before surgery, you can play an important role. Because skin is not sterile, your skin needs to be as free of germs as  possible. You can reduce the number of germs on your skin by washing with CHG (chlorahexidine gluconate) Soap before surgery.  CHG is an antiseptic cleaner which kills germs and bonds with the skin to continue killing germs even after washing.    Oral Hygiene is also important to reduce your risk of infection.  Remember - BRUSH YOUR TEETH THE MORNING OF SURGERY WITH YOUR REGULAR TOOTHPASTE  Please do not use if you have an allergy to CHG or antibacterial soaps. If your skin becomes reddened/irritated stop using the CHG.  Do not shave (including legs and underarms) for at least 48 hours prior to first CHG shower. It is OK to shave your face.  Please follow these instructions carefully.   Shower the NIGHT BEFORE SURGERY and the MORNING OF SURGERY  with DIAL Soap.   Pat yourself dry with a CLEAN TOWEL.  Wear CLEAN PAJAMAS to bed the night before surgery  Place CLEAN SHEETS on your bed the night of your first shower and DO NOT SLEEP WITH PETS.   Day of Surgery: Please shower morning of surgery  Wear Clean/Comfortable clothing the morning of surgery Do not apply any deodorants/lotions.   Remember to brush your teeth WITH YOUR REGULAR TOOTHPASTE.   Questions were answered. Patient verbalized understanding of instructions.

## 2022-04-12 ENCOUNTER — Ambulatory Visit (HOSPITAL_COMMUNITY)
Admission: RE | Admit: 2022-04-12 | Discharge: 2022-04-12 | Disposition: A | Discharge status: Home / Self Care | Payer: Medicare Other | Attending: Vascular Surgery | Admitting: Vascular Surgery

## 2022-04-12 ENCOUNTER — Ambulatory Visit (HOSPITAL_BASED_OUTPATIENT_CLINIC_OR_DEPARTMENT_OTHER): Payer: Medicare Other | Admitting: Emergency Medicine

## 2022-04-12 ENCOUNTER — Ambulatory Visit (HOSPITAL_COMMUNITY): Payer: Medicare Other | Admitting: Emergency Medicine

## 2022-04-12 ENCOUNTER — Other Ambulatory Visit: Payer: Self-pay

## 2022-04-12 ENCOUNTER — Encounter (HOSPITAL_COMMUNITY)
Admission: RE | Disposition: A | Discharge status: Home / Self Care | Payer: Self-pay | Source: Home / Self Care | Attending: Vascular Surgery

## 2022-04-12 ENCOUNTER — Encounter (HOSPITAL_COMMUNITY): Payer: Self-pay | Admitting: Vascular Surgery

## 2022-04-12 DIAGNOSIS — I25119 Atherosclerotic heart disease of native coronary artery with unspecified angina pectoris: Secondary | ICD-10-CM

## 2022-04-12 DIAGNOSIS — I252 Old myocardial infarction: Secondary | ICD-10-CM | POA: Diagnosis not present

## 2022-04-12 DIAGNOSIS — E1122 Type 2 diabetes mellitus with diabetic chronic kidney disease: Secondary | ICD-10-CM | POA: Diagnosis not present

## 2022-04-12 DIAGNOSIS — I132 Hypertensive heart and chronic kidney disease with heart failure and with stage 5 chronic kidney disease, or end stage renal disease: Secondary | ICD-10-CM

## 2022-04-12 DIAGNOSIS — I509 Heart failure, unspecified: Secondary | ICD-10-CM | POA: Diagnosis not present

## 2022-04-12 DIAGNOSIS — N185 Chronic kidney disease, stage 5: Secondary | ICD-10-CM | POA: Diagnosis not present

## 2022-04-12 DIAGNOSIS — Z992 Dependence on renal dialysis: Secondary | ICD-10-CM | POA: Insufficient documentation

## 2022-04-12 DIAGNOSIS — J449 Chronic obstructive pulmonary disease, unspecified: Secondary | ICD-10-CM | POA: Diagnosis not present

## 2022-04-12 DIAGNOSIS — N186 End stage renal disease: Secondary | ICD-10-CM

## 2022-04-12 HISTORY — PX: AV FISTULA PLACEMENT: SHX1204

## 2022-04-12 LAB — GLUCOSE, CAPILLARY
Glucose-Capillary: 132 mg/dL — ABNORMAL HIGH (ref 70–99)
Glucose-Capillary: 174 mg/dL — ABNORMAL HIGH (ref 70–99)
Glucose-Capillary: 176 mg/dL — ABNORMAL HIGH (ref 70–99)

## 2022-04-12 LAB — POCT I-STAT, CHEM 8
BUN: 49 mg/dL — ABNORMAL HIGH (ref 8–23)
Calcium, Ion: 1.08 mmol/L — ABNORMAL LOW (ref 1.15–1.40)
Chloride: 97 mmol/L — ABNORMAL LOW (ref 98–111)
Creatinine, Ser: 9.4 mg/dL — ABNORMAL HIGH (ref 0.61–1.24)
Glucose, Bld: 135 mg/dL — ABNORMAL HIGH (ref 70–99)
HCT: 37 % — ABNORMAL LOW (ref 39.0–52.0)
Hemoglobin: 12.6 g/dL — ABNORMAL LOW (ref 13.0–17.0)
Potassium: 5.2 mmol/L — ABNORMAL HIGH (ref 3.5–5.1)
Sodium: 134 mmol/L — ABNORMAL LOW (ref 135–145)
TCO2: 27 mmol/L (ref 22–32)

## 2022-04-12 SURGERY — ARTERIOVENOUS (AV) FISTULA CREATION
Anesthesia: Monitor Anesthesia Care | Laterality: Right

## 2022-04-12 MED ORDER — HEPARIN 6000 UNIT IRRIGATION SOLUTION
Status: DC | PRN
  Administered 2022-04-12: 1

## 2022-04-12 MED ORDER — DEXMEDETOMIDINE HCL IN NACL 400 MCG/100ML IV SOLN
0.4000 ug/kg/h | INTRAVENOUS | Status: AC
  Administered 2022-04-12: .4 ug/kg/h via INTRAVENOUS

## 2022-04-12 MED ORDER — ORAL CARE MOUTH RINSE: 15.0000 mL | Freq: Once | OROMUCOSAL | Status: AC

## 2022-04-12 MED ORDER — HEPARIN SODIUM (PORCINE) 1000 UNIT/ML IJ SOLN
INTRAMUSCULAR | Status: DC | PRN
  Administered 2022-04-12: 7000 [IU] via INTRAVENOUS

## 2022-04-12 MED ORDER — LIDOCAINE-EPINEPHRINE (PF) 1 %-1:200000 IJ SOLN
INTRAMUSCULAR | Status: AC
  Filled 2022-04-12: qty 30

## 2022-04-12 MED ORDER — PROTAMINE SULFATE 10 MG/ML IV SOLN
INTRAVENOUS | Status: AC
  Filled 2022-04-12: qty 5

## 2022-04-12 MED ORDER — ROCURONIUM BROMIDE 10 MG/ML (PF) SYRINGE
PREFILLED_SYRINGE | INTRAVENOUS | Status: AC
  Filled 2022-04-12: qty 10

## 2022-04-12 MED ORDER — FENTANYL CITRATE (PF) 100 MCG/2ML IJ SOLN: 25.0000 ug | INTRAMUSCULAR | Status: DC | PRN

## 2022-04-12 MED ORDER — SUCCINYLCHOLINE CHLORIDE 200 MG/10ML IV SOSY
PREFILLED_SYRINGE | INTRAVENOUS | Status: AC
  Filled 2022-04-12: qty 10

## 2022-04-12 MED ORDER — ACETAMINOPHEN 10 MG/ML IV SOLN: 1000.0000 mg | Freq: Once | INTRAVENOUS | Status: DC | PRN

## 2022-04-12 MED ORDER — DEXAMETHASONE SODIUM PHOSPHATE 10 MG/ML IJ SOLN
INTRAMUSCULAR | Status: AC
  Filled 2022-04-12: qty 1

## 2022-04-12 MED ORDER — METOPROLOL TARTRATE 5 MG/5ML IV SOLN
INTRAVENOUS | Status: AC
Start: 2022-04-12 — End: ?
  Filled 2022-04-12: qty 5

## 2022-04-12 MED ORDER — ONDANSETRON HCL 4 MG/2ML IJ SOLN
INTRAMUSCULAR | Status: AC
  Filled 2022-04-12: qty 2

## 2022-04-12 MED ORDER — FENTANYL CITRATE (PF) 250 MCG/5ML IJ SOLN
INTRAMUSCULAR | Status: AC
  Filled 2022-04-12: qty 5

## 2022-04-12 MED ORDER — ALBUMIN HUMAN 5 % IV SOLN
12.5000 g | Freq: Once | INTRAVENOUS | Status: AC
  Administered 2022-04-12: 12.5 g via INTRAVENOUS

## 2022-04-12 MED ORDER — PAPAVERINE HCL 30 MG/ML IJ SOLN
INTRAMUSCULAR | Status: AC
  Filled 2022-04-12: qty 2

## 2022-04-12 MED ORDER — CHLORHEXIDINE GLUCONATE CLOTH 2 % EX PADS: 6.0000 | MEDICATED_PAD | Freq: Once | CUTANEOUS | Status: DC

## 2022-04-12 MED ORDER — FENTANYL CITRATE (PF) 100 MCG/2ML IJ SOLN: 25.0000 ug | Freq: Once | INTRAMUSCULAR | Status: AC

## 2022-04-12 MED ORDER — VASOPRESSIN 20 UNIT/ML IV SOLN
INTRAVENOUS | Status: AC
  Filled 2022-04-12: qty 1

## 2022-04-12 MED ORDER — PHENYLEPHRINE HCL-NACL 20-0.9 MG/250ML-% IV SOLN
INTRAVENOUS | Status: DC | PRN
  Administered 2022-04-12: 25 ug/min via INTRAVENOUS

## 2022-04-12 MED ORDER — HEPARIN SODIUM (PORCINE) 1000 UNIT/ML IJ SOLN
INTRAMUSCULAR | Status: AC
  Filled 2022-04-12: qty 1

## 2022-04-12 MED ORDER — SODIUM CHLORIDE 0.9 % IV SOLN: INTRAVENOUS | Status: DC

## 2022-04-12 MED ORDER — MEPIVACAINE HCL (PF) 1 % IJ SOLN
INTRAMUSCULAR | Status: DC | PRN
  Administered 2022-04-12: 10 mL via PERINEURAL

## 2022-04-12 MED ORDER — LIDOCAINE-EPINEPHRINE (PF) 1 %-1:200000 IJ SOLN
INTRAMUSCULAR | Status: DC | PRN
  Administered 2022-04-12: 15 mL

## 2022-04-12 MED ORDER — DEXMEDETOMIDINE (PRECEDEX) IN NS 20 MCG/5ML (4 MCG/ML) IV SYRINGE
PREFILLED_SYRINGE | INTRAVENOUS | Status: DC | PRN
  Administered 2022-04-12 (×4): 4 ug via INTRAVENOUS

## 2022-04-12 MED ORDER — VANCOMYCIN HCL IN DEXTROSE 1-5 GM/200ML-% IV SOLN
1000.0000 mg | INTRAVENOUS | Status: AC
  Administered 2022-04-12: 1000 mg via INTRAVENOUS
  Filled 2022-04-12: qty 200

## 2022-04-12 MED ORDER — PHENYLEPHRINE 80 MCG/ML (10ML) SYRINGE FOR IV PUSH (FOR BLOOD PRESSURE SUPPORT)
PREFILLED_SYRINGE | INTRAVENOUS | Status: AC
  Filled 2022-04-12: qty 20

## 2022-04-12 MED ORDER — EPHEDRINE 5 MG/ML INJ
INTRAVENOUS | Status: AC
  Filled 2022-04-12: qty 5

## 2022-04-12 MED ORDER — 0.9 % SODIUM CHLORIDE (POUR BTL) OPTIME
TOPICAL | Status: DC | PRN
  Administered 2022-04-12: 1000 mL

## 2022-04-12 MED ORDER — MIDAZOLAM HCL 2 MG/2ML IJ SOLN
INTRAMUSCULAR | Status: AC
  Filled 2022-04-12: qty 2

## 2022-04-12 MED ORDER — DEXMEDETOMIDINE HCL IN NACL 200 MCG/50ML IV SOLN: 0.4000 ug/kg/h | INTRAVENOUS | Status: DC

## 2022-04-12 MED ORDER — FENTANYL CITRATE (PF) 100 MCG/2ML IJ SOLN
INTRAMUSCULAR | Status: AC
  Administered 2022-04-12: 25 ug via INTRAVENOUS
  Filled 2022-04-12: qty 2

## 2022-04-12 MED ORDER — ALBUMIN HUMAN 5 % IV SOLN
INTRAVENOUS | Status: AC
  Filled 2022-04-12: qty 250

## 2022-04-12 MED ORDER — OXYCODONE-ACETAMINOPHEN 5-325 MG PO TABS: 1.0000 | ORAL_TABLET | Freq: Four times a day (QID) | ORAL | 0 refills | Status: DC | PRN

## 2022-04-12 MED ORDER — ONDANSETRON HCL 4 MG/2ML IJ SOLN
INTRAMUSCULAR | Status: DC | PRN
  Administered 2022-04-12: 4 mg via INTRAVENOUS

## 2022-04-12 MED ORDER — PHENYLEPHRINE 80 MCG/ML (10ML) SYRINGE FOR IV PUSH (FOR BLOOD PRESSURE SUPPORT)
PREFILLED_SYRINGE | INTRAVENOUS | Status: DC | PRN
  Administered 2022-04-12 (×5): 80 ug via INTRAVENOUS

## 2022-04-12 MED ORDER — LIDOCAINE-EPINEPHRINE (PF) 1.5 %-1:200000 IJ SOLN
INTRAMUSCULAR | Status: DC | PRN
  Administered 2022-04-12: 15 mL via PERINEURAL

## 2022-04-12 MED ORDER — CHLORHEXIDINE GLUCONATE 0.12 % MT SOLN
15.0000 mL | Freq: Once | OROMUCOSAL | Status: AC
  Administered 2022-04-12: 15 mL via OROMUCOSAL
  Filled 2022-04-12: qty 15

## 2022-04-12 MED ORDER — INSULIN ASPART 100 UNIT/ML IJ SOLN: 0.0000 [IU] | INTRAMUSCULAR | Status: DC | PRN

## 2022-04-12 MED ORDER — PROTAMINE SULFATE 10 MG/ML IV SOLN
INTRAVENOUS | Status: DC | PRN
  Administered 2022-04-12 (×4): 10 mg via INTRAVENOUS

## 2022-04-12 MED ORDER — DEXMEDETOMIDINE HCL IN NACL 400 MCG/100ML IV SOLN
0.4000 ug/kg/h | INTRAVENOUS | Status: DC
  Filled 2022-04-12: qty 100

## 2022-04-12 MED ORDER — LIDOCAINE 2% (20 MG/ML) 5 ML SYRINGE
INTRAMUSCULAR | Status: AC
  Filled 2022-04-12: qty 5

## 2022-04-12 MED ORDER — PROPOFOL 10 MG/ML IV BOLUS
INTRAVENOUS | Status: AC
  Filled 2022-04-12: qty 20

## 2022-04-12 SURGICAL SUPPLY — 40 items
ARMBAND PINK RESTRICT EXTREMIT (MISCELLANEOUS) ×4 IMPLANT
BAG COUNTER SPONGE SURGICOUNT (BAG) ×2 IMPLANT
CANISTER SUCT 3000ML PPV (MISCELLANEOUS) ×2 IMPLANT
CANNULA VESSEL 3MM 2 BLNT TIP (CANNULA) ×2 IMPLANT
CLIP VESOCCLUDE MED 6/CT (CLIP) ×2 IMPLANT
CLIP VESOCCLUDE SM WIDE 6/CT (CLIP) ×2 IMPLANT
COVER PROBE W GEL 5X96 (DRAPES) IMPLANT
DERMABOND ADVANCED (GAUZE/BANDAGES/DRESSINGS) ×1
DERMABOND ADVANCED .7 DNX12 (GAUZE/BANDAGES/DRESSINGS) ×1 IMPLANT
ELECT REM PT RETURN 9FT ADLT (ELECTROSURGICAL) ×2
ELECTRODE REM PT RTRN 9FT ADLT (ELECTROSURGICAL) ×1 IMPLANT
GEL ULTRASOUND 8.5O AQUASONIC (MISCELLANEOUS) ×1 IMPLANT
GLOVE BIO SURGEON STRL SZ7.5 (GLOVE) ×2 IMPLANT
GLOVE BIO SURGEON STRL SZ8 (GLOVE) ×1 IMPLANT
GLOVE BIOGEL PI IND STRL 6.5 (GLOVE) IMPLANT
GLOVE BIOGEL PI IND STRL 8 (GLOVE) ×1 IMPLANT
GLOVE BIOGEL PI INDICATOR 6.5 (GLOVE) ×1
GLOVE BIOGEL PI INDICATOR 8 (GLOVE) ×1
GLOVE SURG POLY ORTHO LF SZ7.5 (GLOVE) IMPLANT
GLOVE SURG UNDER LTX SZ8 (GLOVE) ×2 IMPLANT
GOWN STRL REUS W/ TWL LRG LVL3 (GOWN DISPOSABLE) ×3 IMPLANT
GOWN STRL REUS W/TWL LRG LVL3 (GOWN DISPOSABLE) ×6
GRAFT GORETEX STRT 4-7X45 (Vascular Products) ×1 IMPLANT
KIT BASIN OR (CUSTOM PROCEDURE TRAY) ×2 IMPLANT
KIT TURNOVER KIT B (KITS) ×2 IMPLANT
NS IRRIG 1000ML POUR BTL (IV SOLUTION) ×2 IMPLANT
PACK CV ACCESS (CUSTOM PROCEDURE TRAY) ×2 IMPLANT
PAD ARMBOARD 7.5X6 YLW CONV (MISCELLANEOUS) ×4 IMPLANT
SLING ARM FOAM STRAP LRG (SOFTGOODS) ×1 IMPLANT
SLING ARM FOAM STRAP MED (SOFTGOODS) IMPLANT
SPIKE FLUID TRANSFER (MISCELLANEOUS) ×2 IMPLANT
SPONGE SURGIFOAM ABS GEL 100 (HEMOSTASIS) IMPLANT
SUT MNCRL AB 4-0 PS2 18 (SUTURE) ×3 IMPLANT
SUT PROLENE 6 0 BV (SUTURE) ×4 IMPLANT
SUT SILK 2 0 SH (SUTURE) ×1 IMPLANT
SUT VIC AB 3-0 SH 27 (SUTURE) ×4
SUT VIC AB 3-0 SH 27X BRD (SUTURE) ×1 IMPLANT
TOWEL GREEN STERILE (TOWEL DISPOSABLE) ×2 IMPLANT
UNDERPAD 30X36 HEAVY ABSORB (UNDERPADS AND DIAPERS) ×2 IMPLANT
WATER STERILE IRR 1000ML POUR (IV SOLUTION) ×2 IMPLANT

## 2022-04-12 NOTE — Discharge Instructions (Signed)
[facility]  Discharge Instructions  AV Fistula or Graft Surgery for Dialysis Access  Please refer to the following instructions for your post-procedure care. Your surgeon or physician assistant will discuss any changes with you.  Activity  You may drive the day following your surgery, if you are comfortable and no longer taking prescription pain medication. Resume full activity as the soreness in your incision resolves.  Bathing/Showering  You may shower after you go home. Keep your incision dry for 48 hours. Do not soak in a bathtub, hot tub, or swim until the incision heals completely. You may not shower if you have a hemodialysis catheter.  Incision Care  Clean your incision with mild soap and water after 48 hours. Pat the area dry with a clean towel. You do not need a bandage unless otherwise instructed. Do not apply any ointments or creams to your incision. You may have skin glue on your incision. Do not peel it off. It will come off on its own in about one week. Your arm may swell a bit after surgery. To reduce swelling use pillows to elevate your arm so it is above your heart. Your doctor will tell you if you need to lightly wrap your arm with an ACE bandage.  Diet  Resume your normal diet. There are not special food restrictions following this procedure. In order to heal from your surgery, it is CRITICAL to get adequate nutrition. Your body requires vitamins, minerals, and protein. Vegetables are the best source of vitamins and minerals. Vegetables also provide the perfect balance of protein. Processed food has little nutritional value, so try to avoid this.  Medications  Resume taking all of your medications. If your incision is causing pain, you may take over-the counter pain relievers such as acetaminophen (Tylenol). If you were prescribed a stronger pain medication, please be aware these medications can cause nausea and constipation. Prevent  nausea by taking the medication with a snack or meal. Avoid constipation by drinking plenty of fluids and eating foods with high amount of fiber, such as fruits, vegetables, and grains.  Do not take Tylenol if you are taking prescription pain medications.  Follow up Your surgeon may want to see you in the office following your access surgery. If so, this will be arranged at the time of your surgery.  Please call us immediately for any of the following conditions:  Increased pain, redness, drainage (pus) from your incision site Fever of 101 degrees or higher Severe or worsening pain at your incision site Hand pain or numbness.  Reduce your risk of vascular disease:  Stop smoking. If you would like help, call [facility] at 1-800-QUIT-NOW (585)666-8209) or [place] at [place] your cholesterol Maintain a desired weight Control your diabetes Keep your blood pressure down  Dialysis  It will take several weeks to several months for your new dialysis access to be ready for use. Your surgeon will determine when it is okay to use it. Your nephrologist will continue to direct your dialysis. You can continue to use your Permcath until your new access is ready for use.   04/12/2022 ANAV LAMMERT [id number] 1950/12/25  Surgeon(s): Angelia Mould, MD  Procedure(s): RIGHT UPPER EXTREMITY ARTERIOVENOUS (AV)  GRAFT INSERTION USING GORE STRETCH 4-7 MM   May stick graft immediately   May stick graft on designated area only:   X Do not stick Right forearm loop graft for 4 weeks    If  you have any questions, please call the office at [phone].

## 2022-04-12 NOTE — Op Note (Signed)
NAME: Jacob [name]    MRN: [id number] DOB: 17-Feb-1951    DATE OF OPERATION: 04/12/2022  PREOP DIAGNOSIS:    End-stage renal disease  POSTOP DIAGNOSIS:    Same  PROCEDURE:    New right forearm AV graft (4-7 mm PTFE graft  SURGEON: Judeth Cornfield. Scot Dock, MD  ASSIST: Karoline Caldwell, PA  ANESTHESIA: Block  EBL: Minimal  INDICATIONS:    Jacob [name] is a 71 y.o. male who presents for new access.  He had steal in the left upper extremity and that fistula had to be ligated.  FINDINGS:   The basilic vein looks marginal in size.  He felt strongly about having access in his forearm.  I placed the right forearm loop with the arterial aspect of the graft along the radial side of the forearm.  He might potentially be a candidate for a basilic vein transposition in the future if the vein enlarges some.  TECHNIQUE:   The patient was taken to the operating room after a block was placed by anesthesia.  The right arm was prepped and draped in usual sterile fashion.  I had looked at the basilic vein myself with the SonoSite the vein was marginal in size.  I elected to place a forearm loop.  It is possible that if the vein enlarges in the future he would be a candidate for a basilic vein transposition.  Today he felt strongly about having access in his forearm.  On the left side he had an upper arm fistula which had significant problems and steal.  A transverse incision was made just below the antecubital level.  Here the brachial vein and adjacent artery were dissected free.  Patient did have 2 arteries and likely has a high bifurcation of the brachial artery.  Both were reasonable in size.  I selected the radial artery likely.  Using 1 distal counterincision a 4-7 mm PTFE graft was tunneled in a looped fashion in the right forearm.  The arterial aspect of the graft was along the radial aspect of the forearm.  The brachial vein had been dissected free ligated distally and then irrigated up  with heparinized saline.  It took a 5 mm dilator.  The patient was heparinized.  The radial artery was clamped proximally and distally and a longitudinal arteriotomy was made.  A short segment of the 4 mm end of the graft was excised, the graft spatulated and sewn end-to-side to the artery using continuous 6-0 Prolene suture.  The graft was then pulled the appropriate length for anastomosis to the brachial vein.  The vein was ligated distally and spatulated proximally.  The graft is cut to the appropriate length spatulated and sewn into into the vein using 2 continuous 6-0 Prolene sutures.  At the completion was a good thrill in the fistula.  There was a radial and ulnar signal with the Doppler.  Hemostasis was obtained in the wounds.  Each of the wounds was closed with a deep layer of 3-0 Vicryl and the skin closed with 4-0 Monocryl.  Dermabond was applied.  The patient tolerated the procedure well was transferred to recovery room in stable condition.  All needle and sponge counts were correct.  Given the complexity of the case,  the assistant was necessary in order to expedient the procedure and safely perform the technical aspects of the operation.  The assistant provided traction and countertraction to assist with exposure of the artery and vein.  They also assisted with  suture ligation of multiple venous branches. They also assisted with tunneling of the graft.  They played a critical role for both anastomoses.. These skills, especially following the Prolene suture for the anastomosis, could not have been adequately performed by a scrub tech assistant.    Deitra Mayo, MD, FACS [facility]  DATE OF DICTATION:   04/12/2022

## 2022-04-12 NOTE — Anesthesia Procedure Notes (Signed)
Anesthesia Regional Block: Supraclavicular block   Pre-Anesthetic Checklist: , timeout performed,  Correct Patient, Correct Site, Correct Laterality,  Correct Procedure, Correct Position, site marked,  Risks and benefits discussed,  Surgical consent,  Pre-op evaluation,  At surgeon's request and post-op pain management  Laterality: Right and Upper  Prep: chloraprep       Needles:  Injection technique: Single-shot      Needle Length: 5cm  Needle Gauge: 22     Additional Needles: Arrow StimuQuik ECHO Echogenic Stimulating PNB Needle  Procedures:,,,, ultrasound used (permanent image in chart),,    Narrative:  Start time: 04/12/2022 10:03 AM End time: 04/12/2022 10:10 AM Injection made incrementally with aspirations every 5 mL.  Performed by: Personally  Anesthesiologist: Oleta Mouse, MD

## 2022-04-12 NOTE — Interval H&P Note (Signed)
History and Physical Interval Note:  04/12/2022 10:06 AM  Jacob [name]  has presented today for surgery, with the diagnosis of END STAGE RENAL DISEASE.  The various methods of treatment have been discussed with the patient and family. After consideration of risks, benefits and other options for treatment, the patient has consented to  Procedure(s): RIGHT UPPER EXTREMITY ARTERIOVENOUS (AV) FISTULA CREATION VERSUS GRAFT (Right) as a surgical intervention.  The patient's history has been reviewed, patient examined, no change in status, stable for surgery.  I have reviewed the patient's chart and labs.  Questions were answered to the patient's satisfaction.     Deitra Mayo

## 2022-04-12 NOTE — Transfer of Care (Signed)
Immediate Anesthesia Transfer of Care Note  Patient: Jacob [name]  Procedure(s) Performed: RIGHT UPPER EXTREMITY ARTERIOVENOUS (AV)  GRAFT INSERTION USING GORE STRETCH 4-7 MM (Right)  Patient Location: PACU  Anesthesia Type:MAC  Level of Consciousness: awake and responds to stimulation  Airway & Oxygen Therapy: Patient Spontanous Breathing and Patient connected to T-piece oxygen  Post-op Assessment: Report given to RN and Post -op Vital signs reviewed and stable  Post vital signs: Reviewed and stable  Last Vitals:  Vitals Value Taken Time  BP 90/48 04/12/22 1219  Temp    Pulse    Resp 16 04/12/22 1221  SpO2    Vitals shown include unvalidated device data.  Last Pain:  Vitals:   04/12/22 0827  PainSc: 0-No pain         Complications: No notable events documented.

## 2022-04-13 ENCOUNTER — Encounter (HOSPITAL_COMMUNITY): Payer: Self-pay | Admitting: Vascular Surgery

## 2022-04-13 NOTE — Anesthesia Postprocedure Evaluation (Signed)
Anesthesia Post Note  Patient: SHISHIR KRANTZ  Procedure(s) Performed: RIGHT UPPER EXTREMITY ARTERIOVENOUS (AV)  GRAFT INSERTION USING GORE STRETCH 4-7 MM (Right)     Patient location during evaluation: PACU Anesthesia Type: Regional and MAC Level of consciousness: awake and alert Pain management: pain level controlled Vital Signs Assessment: post-procedure vital signs reviewed and stable Respiratory status: spontaneous breathing, nonlabored ventilation, respiratory function stable and patient connected to nasal cannula oxygen Cardiovascular status: stable and blood pressure returned to baseline Postop Assessment: no apparent nausea or vomiting Anesthetic complications: no   No notable events documented.  Last Vitals:  Vitals:   04/12/22 1335 04/12/22 1350  BP: 126/68 131/62  Pulse: 67 61  Resp: 18 (!) 9  Temp:  36.7 C  SpO2: 96% 98%    Last Pain:  Vitals:   04/12/22 0827  PainSc: 0-No pain                 Kyriaki Moder

## 2022-04-14 ENCOUNTER — Ambulatory Visit
Admission: RE | Admit: 2022-04-14 | Discharge: 2022-04-14 | Disposition: A | Discharge status: Home / Self Care | Payer: Medicare Other | Source: Ambulatory Visit | Attending: Family Medicine | Admitting: Family Medicine

## 2022-04-14 DIAGNOSIS — Z720 Tobacco use: Secondary | ICD-10-CM

## 2022-04-14 DIAGNOSIS — F17210 Nicotine dependence, cigarettes, uncomplicated: Secondary | ICD-10-CM | POA: Diagnosis not present

## 2022-04-15 ENCOUNTER — Telehealth: Payer: Self-pay

## 2022-04-15 DIAGNOSIS — N2581 Secondary hyperparathyroidism of renal origin: Secondary | ICD-10-CM | POA: Diagnosis not present

## 2022-04-15 DIAGNOSIS — D631 Anemia in chronic kidney disease: Secondary | ICD-10-CM | POA: Diagnosis not present

## 2022-04-15 DIAGNOSIS — N186 End stage renal disease: Secondary | ICD-10-CM | POA: Diagnosis not present

## 2022-04-15 DIAGNOSIS — T8249XA Other complication of vascular dialysis catheter, initial encounter: Secondary | ICD-10-CM | POA: Diagnosis not present

## 2022-04-15 DIAGNOSIS — Z992 Dependence on renal dialysis: Secondary | ICD-10-CM | POA: Diagnosis not present

## 2022-04-18 ENCOUNTER — Emergency Department (HOSPITAL_COMMUNITY)
Admission: EM | Admit: 2022-04-18 | Discharge: 2022-04-18 | Disposition: A | Discharge status: Home / Self Care | Payer: Medicare Other | Attending: Emergency Medicine | Admitting: Emergency Medicine

## 2022-04-18 ENCOUNTER — Encounter (HOSPITAL_COMMUNITY): Payer: Self-pay | Admitting: Emergency Medicine

## 2022-04-18 ENCOUNTER — Other Ambulatory Visit: Payer: Self-pay

## 2022-04-18 DIAGNOSIS — Z743 Need for continuous supervision: Secondary | ICD-10-CM | POA: Diagnosis not present

## 2022-04-18 DIAGNOSIS — E1122 Type 2 diabetes mellitus with diabetic chronic kidney disease: Secondary | ICD-10-CM | POA: Diagnosis not present

## 2022-04-18 DIAGNOSIS — Z794 Long term (current) use of insulin: Secondary | ICD-10-CM | POA: Insufficient documentation

## 2022-04-18 DIAGNOSIS — Z79899 Other long term (current) drug therapy: Secondary | ICD-10-CM | POA: Diagnosis not present

## 2022-04-18 DIAGNOSIS — G8918 Other acute postprocedural pain: Secondary | ICD-10-CM | POA: Insufficient documentation

## 2022-04-18 DIAGNOSIS — R609 Edema, unspecified: Secondary | ICD-10-CM | POA: Diagnosis not present

## 2022-04-18 DIAGNOSIS — I2510 Atherosclerotic heart disease of native coronary artery without angina pectoris: Secondary | ICD-10-CM | POA: Insufficient documentation

## 2022-04-18 DIAGNOSIS — I120 Hypertensive chronic kidney disease with stage 5 chronic kidney disease or end stage renal disease: Secondary | ICD-10-CM | POA: Diagnosis not present

## 2022-04-18 DIAGNOSIS — Z992 Dependence on renal dialysis: Secondary | ICD-10-CM | POA: Insufficient documentation

## 2022-04-18 DIAGNOSIS — N186 End stage renal disease: Secondary | ICD-10-CM | POA: Insufficient documentation

## 2022-04-18 DIAGNOSIS — I132 Hypertensive heart and chronic kidney disease with heart failure and with stage 5 chronic kidney disease, or end stage renal disease: Secondary | ICD-10-CM | POA: Insufficient documentation

## 2022-04-18 DIAGNOSIS — I5040 Unspecified combined systolic (congestive) and diastolic (congestive) heart failure: Secondary | ICD-10-CM | POA: Diagnosis not present

## 2022-04-18 NOTE — ED Triage Notes (Signed)
Patient reports AV graft swelling at right forearm inserted last week .

## 2022-04-19 ENCOUNTER — Encounter (HOSPITAL_BASED_OUTPATIENT_CLINIC_OR_DEPARTMENT_OTHER): Payer: Medicare Other | Admitting: General Surgery

## 2022-04-19 ENCOUNTER — Other Ambulatory Visit: Payer: Self-pay | Admitting: *Deleted

## 2022-04-19 DIAGNOSIS — Z992 Dependence on renal dialysis: Secondary | ICD-10-CM | POA: Diagnosis not present

## 2022-04-19 DIAGNOSIS — I252 Old myocardial infarction: Secondary | ICD-10-CM | POA: Diagnosis not present

## 2022-04-19 DIAGNOSIS — M1990 Unspecified osteoarthritis, unspecified site: Secondary | ICD-10-CM | POA: Diagnosis not present

## 2022-04-19 DIAGNOSIS — L97512 Non-pressure chronic ulcer of other part of right foot with fat layer exposed: Secondary | ICD-10-CM | POA: Diagnosis not present

## 2022-04-19 DIAGNOSIS — E1122 Type 2 diabetes mellitus with diabetic chronic kidney disease: Secondary | ICD-10-CM | POA: Diagnosis not present

## 2022-04-19 DIAGNOSIS — E1140 Type 2 diabetes mellitus with diabetic neuropathy, unspecified: Secondary | ICD-10-CM | POA: Diagnosis not present

## 2022-04-19 DIAGNOSIS — N2581 Secondary hyperparathyroidism of renal origin: Secondary | ICD-10-CM | POA: Diagnosis not present

## 2022-04-19 DIAGNOSIS — T8249XA Other complication of vascular dialysis catheter, initial encounter: Secondary | ICD-10-CM | POA: Diagnosis not present

## 2022-04-19 DIAGNOSIS — N186 End stage renal disease: Secondary | ICD-10-CM | POA: Diagnosis not present

## 2022-04-19 DIAGNOSIS — I132 Hypertensive heart and chronic kidney disease with heart failure and with stage 5 chronic kidney disease, or end stage renal disease: Secondary | ICD-10-CM | POA: Diagnosis not present

## 2022-04-19 DIAGNOSIS — E11621 Type 2 diabetes mellitus with foot ulcer: Secondary | ICD-10-CM | POA: Diagnosis not present

## 2022-04-19 DIAGNOSIS — D631 Anemia in chronic kidney disease: Secondary | ICD-10-CM | POA: Diagnosis not present

## 2022-04-19 DIAGNOSIS — I5042 Chronic combined systolic (congestive) and diastolic (congestive) heart failure: Secondary | ICD-10-CM | POA: Diagnosis not present

## 2022-04-19 DIAGNOSIS — F17210 Nicotine dependence, cigarettes, uncomplicated: Secondary | ICD-10-CM | POA: Diagnosis not present

## 2022-04-19 DIAGNOSIS — E785 Hyperlipidemia, unspecified: Secondary | ICD-10-CM | POA: Diagnosis not present

## 2022-04-19 DIAGNOSIS — Z794 Long term (current) use of insulin: Secondary | ICD-10-CM | POA: Diagnosis not present

## 2022-04-19 DIAGNOSIS — Z951 Presence of aortocoronary bypass graft: Secondary | ICD-10-CM | POA: Diagnosis not present

## 2022-04-19 DIAGNOSIS — J449 Chronic obstructive pulmonary disease, unspecified: Secondary | ICD-10-CM | POA: Diagnosis not present

## 2022-04-19 MED ORDER — OXYCODONE-ACETAMINOPHEN 5-325 MG PO TABS: 1.0000 | ORAL_TABLET | Freq: Four times a day (QID) | ORAL | 0 refills | Status: DC | PRN

## 2022-04-19 NOTE — Telephone Encounter (Signed)
Patient requested refill.  Pended Rx and sent to Dr. Miller for approval.  

## 2022-04-19 NOTE — Telephone Encounter (Signed)
Opened in error, it appears that patient left a message for Synetta Fail and myself regarding the same request. See telephone encounter dated today opened by Synetta Fail May, CMA

## 2022-04-20 DIAGNOSIS — N186 End stage renal disease: Secondary | ICD-10-CM | POA: Diagnosis not present

## 2022-04-20 DIAGNOSIS — Z992 Dependence on renal dialysis: Secondary | ICD-10-CM | POA: Diagnosis not present

## 2022-04-20 DIAGNOSIS — D631 Anemia in chronic kidney disease: Secondary | ICD-10-CM | POA: Diagnosis not present

## 2022-04-20 DIAGNOSIS — T8249XA Other complication of vascular dialysis catheter, initial encounter: Secondary | ICD-10-CM | POA: Diagnosis not present

## 2022-04-20 DIAGNOSIS — N2581 Secondary hyperparathyroidism of renal origin: Secondary | ICD-10-CM | POA: Diagnosis not present

## 2022-04-21 ENCOUNTER — Other Ambulatory Visit: Payer: Self-pay | Admitting: Gastroenterology

## 2022-04-21 ENCOUNTER — Other Ambulatory Visit (HOSPITAL_COMMUNITY): Payer: Self-pay

## 2022-04-21 MED ORDER — SANDOSTATIN LAR DEPOT 10 MG IM KIT
10.0000 mg | PACK | INTRAMUSCULAR | 3 refills | Status: DC
  Filled 2022-04-21: qty 1, 28d supply, fill #0
  Filled 2022-05-19: qty 1, 28d supply, fill #1
  Filled 2022-06-16: qty 1, 28d supply, fill #2
  Filled 2022-07-18: qty 1, 28d supply, fill #3

## 2022-04-21 NOTE — Progress Notes (Signed)
DAJION, BICKFORD ([id number]) Visit Report for 04/19/2022 Arrival Information Details Patient Name: Date of Service: Jacob [name], Jacob [name] 04/19/2022 11:00 A M Medical Record Number: [id number] Patient Account Number: [id number] Date of Birth/Sex: Treating RN: [birth year] (71 y.o. Mare Ferrari Primary Care Shawana Knoch: Alain Honey Other Clinician: Referring Andree Heeg: Treating Brandt Chaney/Extender: Neil Crouch in Treatment: 7 Visit Information History Since Last Visit Added or deleted any medications: No Patient Arrived: Ambulatory Any new allergies or adverse reactions: No Arrival Time: 11:09 Had a fall or experienced change in No Accompanied By: self activities of daily living that may affect Transfer Assistance: None risk of falls: Patient Identification Verified: Yes Signs or symptoms of abuse/neglect since last visito No Secondary Verification Process Completed: Yes Hospitalized since last visit: No Patient Requires Transmission-Based Precautions: No Implantable device outside of the clinic excluding No Patient Has Alerts: No cellular tissue based products placed in the center since last visit: Has Dressing in Place as Prescribed: Yes Pain Present Now: No Electronic Signature(s) Signed: 04/19/2022 5:02:22 PM By: Sharyn Creamer RN, BSN Entered By: Sharyn Creamer on 04/19/2022 11:11:49 -------------------------------------------------------------------------------- Encounter Discharge Information Details Patient Name: Date of Service: Jacob Katos R. 04/19/2022 11:00 A M Medical Record Number: [id number] Patient Account Number: [id number] Date of Birth/Sex: Treating RN: [birth year] (71 y.o. Mare Ferrari Primary Care Dakisha Schoof: Alain Honey Other Clinician: Referring Weronika Birch: Treating Eben Choinski/Extender: Neil Crouch in Treatment: 7 Encounter Discharge Information Items Post Procedure Vitals Discharge Condition:  Stable Temperature (F): 99.1 Ambulatory Status: Ambulatory Pulse (bpm): 91 Discharge Destination: Home Respiratory Rate (breaths/min): 18 Transportation: Private Auto Blood Pressure (mmHg): 97/61 Accompanied By: self Schedule Follow-up Appointment: Yes Clinical Summary of Care: Patient Declined Electronic Signature(s) Signed: 04/19/2022 5:02:22 PM By: Sharyn Creamer RN, BSN Entered By: Sharyn Creamer on 04/19/2022 11:29:27 -------------------------------------------------------------------------------- Lower Extremity Assessment Details Patient Name: Date of Service: Jacob [name]. 04/19/2022 11:00 A M Medical Record Number: [id number] Patient Account Number: [id number] Date of Birth/Sex: Treating RN: Jul 26, 1951 (71 y.o. Mare Ferrari Primary Care Jacob [name]: Alain Honey Other Clinician: Referring Shaune Malacara: Treating Shamonique Battiste/Extender: Neil Crouch in Treatment: 7 Edema Assessment Assessed: [name]: No] [Right: Yes] E[Left: dema] [Right: :] Calf Left: Right: Point of Measurement: From Medial Instep 34 cm Ankle Left: Right: Point of Measurement: From Medial Instep 20.5 cm Vascular Assessment Pulses: Dorsalis Pedis Palpable: [Right:Yes] Electronic Signature(s) Signed: 04/19/2022 5:02:22 PM By: Sharyn Creamer RN, BSN Entered By: Sharyn Creamer on 04/19/2022 11:20:16 -------------------------------------------------------------------------------- Multi Wound Chart Details Patient Name: Date of Service: Jacob Katos R. 04/19/2022 11:00 A M Medical Record Number: [id number] Patient Account Number: [id number] Date of Birth/Sex: Treating RN: [birth year] (71 y.o. Jacob [name] Primary Care Jaylyn Iyer: Alain Honey Other Clinician: Referring Franchot Pollitt: Treating Sadonna Kotara/Extender: Neil Crouch in Treatment: 7 Vital Signs Height(in): 65 Capillary Blood Glucose(mg/dl): 220 Weight(lbs): 172 Pulse(bpm):  87 Body Mass Index(BMI): 22.1 Blood Pressure(mmHg): 97/61 Temperature(F): 99.1 Respiratory Rate(breaths/min): 18 Photos: [7:Left, Posterior Upper Arm] [8:Right, Plantar Foot] [N/A:N/A N/A] Wound Location: [7:Blister] [8:Blister] [N/A:N/A] Wounding Event: [7:Abrasion] [8:Diabetic Wound/Ulcer of the Lower] [N/A:N/A] Primary Etiology: [7:Cataracts, Glaucoma, Anemia, Chronic Cataracts, Glaucoma, Anemia, Chronic N/A] [8:Extremity] Comorbid History: [7:Obstructive Pulmonary Disease (COPD), Congestive Heart Failure, Coronary Artery Disease, Hypertension, Myocardial Infarction, Hypertension, Myocardial Infarction, Hepatitis C, Type II Diabetes, Gout, Hepatitis C, Type II Diabetes,  Gout, Osteoarthritis, Neuropathy 11/09/2021] [8:Obstructive Pulmonary Disease (COPD), Congestive Heart Failure, Coronary Artery Disease, Osteoarthritis, Neuropathy 03/08/2022] [N/A:N/A] Date  Acquired: [7:7] [8:6] [N/A:N/A] Weeks of Treatment: [7:Healed - Epithelialized] [8:Open] [N/A:N/A] Wound Status: [7:No] [8:No] [N/A:N/A] Wound Recurrence: [7:0x0x0] [8:0.4x0.4x0.2] [N/A:N/A] Measurements L x W x D (cm) [7:0] [8:0.126] [N/A:N/A] A (cm) : rea [7:0] [8:0.025] [N/A:N/A] Volume (cm) : [7:100.00%] [8:88.50%] [N/A:N/A] % Reduction in A [7:rea: 100.00%] [8:92.40%] [N/A:N/A] % Reduction in Volume: [8:9] Starting Position 1 (o'clock): [8:3] Ending Position 1 (o'clock): [8:0.2] Maximum Distance 1 (cm): [7:No] [8:Yes] [N/A:N/A] Undermining: [7:Full Thickness Without Exposed] [8:Grade 2] [N/A:N/A] Classification: [7:Support Structures None Present] [8:Medium] [N/A:N/A] Exudate A mount: [7:N/A] [8:Serosanguineous] [N/A:N/A] Exudate Type: [7:N/A] [8:red, brown] [N/A:N/A] Exudate Color: [7:Flat and Intact] [8:Thickened] [N/A:N/A] Wound Margin: [7:None Present (0%)] [8:Large (67-100%)] [N/A:N/A] Granulation A mount: [7:N/A] [8:Red] [N/A:N/A] Granulation Quality: [7:None Present (0%)] [8:Small (1-33%)] [N/A:N/A] Necrotic A  mount: [7:Fascia: No] [8:Fat Layer (Subcutaneous Tissue): Yes N/A] Exposed Structures: [7:Fat Layer (Subcutaneous Tissue): No Fascia: No Tendon: No Muscle: No Joint: No Bone: No Large (67-100%)] [8:Tendon: No Muscle: No Joint: No Bone: No Small (1-33%)] [N/A:N/A] Epithelialization: [7:N/A] [8:Debridement - Excisional] [N/A:N/A] Debridement: Pre-procedure Verification/Time Out N/A [8:11:24] [N/A:N/A] Taken: [7:N/A] [8:Lidocaine 4% Topical Solution] [N/A:N/A] Pain Control: [7:N/A] [8:Callus, Subcutaneous, Slough] [N/A:N/A] Tissue Debrided: [7:N/A] [8:Skin/Subcutaneous Tissue] [N/A:N/A] Level: [7:N/A] [8:0.25] [N/A:N/A] Debridement A (sq cm): [7:rea N/A] [8:Curette] [N/A:N/A] Instrument: [7:N/A] [8:Minimum] [N/A:N/A] Bleeding: [7:N/A] [8:Pressure] [N/A:N/A] Hemostasis A chieved: [7:N/A] [8:0] [N/A:N/A] Procedural Pain: [7:N/A] [8:0] [N/A:N/A] Post Procedural Pain: [7:N/A] [8:Procedure was tolerated well] [N/A:N/A] Debridement Treatment Response: [7:N/A] [8:0.5x0.5x0.2] [N/A:N/A] Post Debridement Measurements L x W x D (cm) [7:N/A] [8:0.039] [N/A:N/A] Post Debridement Volume: (cm) [7:N/A] [8:Debridement] [N/A:N/A] Treatment Notes Wound #8 (Foot) Wound Laterality: Plantar, Right Cleanser Soap and Water Discharge Instruction: May shower and wash wound with dial antibacterial soap and water prior to dressing change. Wound Cleanser Discharge Instruction: Cleanse the wound with wound cleanser prior to applying a clean dressing using gauze sponges, not tissue or cotton balls. Peri-Wound Care Topical Primary Dressing Hydrofera Blue Ready Foam, 2.5 x2.5 in Discharge Instruction: Apply to wound bed as instructed Secondary Dressing Bordered Gauze, 4x4 in Discharge Instruction: Apply over primary dressing as directed. Optifoam Non-Adhesive Dressing, 4x4 in Discharge Instruction: Apply over primary dressing as directed. Secured With Compression Wrap Compression  Stockings Environmental education officer) Signed: 04/19/2022 11:33:04 AM By: Fredirick Maudlin MD FACS Signed: 04/20/2022 5:21:48 PM By: Dellie Catholic RN Entered By: Fredirick Maudlin on 04/19/2022 11:33:04 -------------------------------------------------------------------------------- Multi-Disciplinary Care Plan Details Patient Name: Date of Service: Jacob [name]. 04/19/2022 11:00 A M Medical Record Number: [id number] Patient Account Number: [id number] Date of Birth/Sex: Treating RN: 11-Jul-1951 (71 y.o. Mare Ferrari Primary Care Mekenna Finau: Alain Honey Other Clinician: Referring Brielynn Sekula: Treating Israel Wunder/Extender: Neil Crouch in Treatment: 7 Active Inactive Wound/Skin Impairment Nursing Diagnoses: Impaired tissue integrity Goals: Patient/caregiver will verbalize understanding of skin care regimen Date Initiated: 03/01/2022 Target Resolution Date: 04/22/2022 Goal Status: Active Interventions: Assess ulceration(s) every visit Treatment Activities: Skin care regimen initiated : 03/01/2022 Notes: Electronic Signature(s) Signed: 04/19/2022 5:02:22 PM By: Sharyn Creamer RN, BSN Entered By: Sharyn Creamer on 04/19/2022 11:26:47 -------------------------------------------------------------------------------- Pain Assessment Details Patient Name: Date of Service: Jacob Katos R. 04/19/2022 11:00 A M Medical Record Number: [id number] Patient Account Number: [id number] Date of Birth/Sex: Treating RN: 08-04-1951 (71 y.o. Mare Ferrari Primary Care Shervon Kerwin: Alain Honey Other Clinician: Referring Tane Biegler: Treating Melody Savidge/Extender: Neil Crouch in Treatment: 7 Active Problems Location of Pain Severity and Description of Pain Patient Has Paino No Site Locations Pain Management and  Medication Current Pain Management: Electronic Signature(s) Signed: 04/19/2022 5:02:22 PM By: Sharyn Creamer RN,  BSN Entered By: Sharyn Creamer on 04/19/2022 11:12:51 -------------------------------------------------------------------------------- Patient/Caregiver Education Details Patient Name: Date of Service: Jacob [name] 6/27/2023andnbsp11:00 [place] Record Number: [id number] Patient Account Number: [id number] Date of Birth/Gender: Treating RN: Nov 02, 1950 (70 y.o. Mare Ferrari Primary Care Physician: Alain Honey Other Clinician: Referring Physician: Treating Physician/Extender: Neil Crouch in Treatment: 7 Education Assessment Education Provided To: Patient Education Topics Provided Offloading: Methods: Explain/Verbal Responses: State content correctly Wound/Skin Impairment: Methods: Explain/Verbal Responses: State content correctly Notes rn reinforced need for foam donut to foot wound, pt verb understanding Electronic Signature(s) Signed: 04/19/2022 5:02:22 PM By: Sharyn Creamer RN, BSN Entered By: Sharyn Creamer on 04/19/2022 11:28:11 -------------------------------------------------------------------------------- Wound Assessment Details Patient Name: Date of Service: Jacob Katos R. 04/19/2022 11:00 A M Medical Record Number: [id number] Patient Account Number: [id number] Date of Birth/Sex: Treating RN: January 15, 1951 (71 y.o. Mare Ferrari Primary Care Rolla Servidio: Alain Honey Other Clinician: Referring Trayshawn Durkin: Treating Ephrata Verville/Extender: Neil Crouch in Treatment: 7 Wound Status Wound Number: 7 Primary Abrasion Etiology: Wound Location: Left, Posterior Upper Arm Wound Healed - Epithelialized Wounding Event: Blister Status: Date Acquired: 11/09/2021 Comorbid Cataracts, Glaucoma, Anemia, Chronic Obstructive Pulmonary Weeks Of Treatment: 7 History: Disease (COPD), Congestive Heart Failure, Coronary Artery Clustered Wound: No Disease, Hypertension, Myocardial Infarction, Hepatitis C, Type  II Diabetes, Gout, Osteoarthritis, Neuropathy Photos Wound Measurements Length: (cm) Width: (cm) Depth: (cm) Area: (cm) Volume: (cm) 0 % Reduction in Area: 100% 0 % Reduction in Volume: 100% 0 Epithelialization: Large (67-100%) 0 Tunneling: No 0 Undermining: No Wound Description Classification: Full Thickness Without Exposed Support Structures Wound Margin: Flat and Intact Exudate Amount: None Present Foul Odor After Cleansing: No Slough/Fibrino No Wound Bed Granulation Amount: None Present (0%) Exposed Structure Necrotic Amount: None Present (0%) Fascia Exposed: No Fat Layer (Subcutaneous Tissue) Exposed: No Tendon Exposed: No Muscle Exposed: No Joint Exposed: No Bone Exposed: No Electronic Signature(s) Signed: 04/19/2022 5:02:22 PM By: Sharyn Creamer RN, BSN Entered By: Sharyn Creamer on 04/19/2022 11:19:40 -------------------------------------------------------------------------------- Wound Assessment Details Patient Name: Date of Service: Jacob [name]. 04/19/2022 11:00 A M Medical Record Number: [id number] Patient Account Number: [id number] Date of Birth/Sex: Treating RN: 1951/08/15 (71 y.o. Mare Ferrari Primary Care Nancie Bocanegra: Other Clinician: Alain Honey Referring [place] Kielbasa: Treating Layci Stenglein/Extender: Neil Crouch in Treatment: 7 Wound Status Wound Number: 8 Primary Diabetic Wound/Ulcer of the Lower Extremity Etiology: Wound Location: Right, Plantar Foot Wound Open Wounding Event: Blister Status: Date Acquired: 03/08/2022 Comorbid Cataracts, Glaucoma, Anemia, Chronic Obstructive Pulmonary Weeks Of Treatment: 6 History: Disease (COPD), Congestive Heart Failure, Coronary Artery Clustered Wound: No Disease, Hypertension, Myocardial Infarction, Hepatitis C, Type II Diabetes, Gout, Osteoarthritis, Neuropathy Photos Wound Measurements Length: (cm) 0.4 Width: (cm) 0.4 Depth: (cm) 0.2 Area: (cm) 0.126 Volume:  (cm) 0.025 % Reduction in Area: 88.5% % Reduction in Volume: 92.4% Epithelialization: Small (1-33%) Tunneling: No Undermining: Yes Starting Position (o'clock): 9 Ending Position (o'clock): 3 Maximum Distance: (cm) 0.2 Wound Description Classification: Grade 2 Wound Margin: Thickened Exudate Amount: Medium Exudate Type: Serosanguineous Exudate Color: red, brown Foul Odor After Cleansing: No Slough/Fibrino Yes Wound Bed Granulation Amount: Large (67-100%) Exposed Structure Granulation Quality: Red Fascia Exposed: No Necrotic Amount: Small (1-33%) Fat Layer (Subcutaneous Tissue) Exposed: Yes Necrotic Quality: Adherent Slough Tendon Exposed: No Muscle Exposed: No Joint Exposed: No Bone Exposed: No Treatment Notes Wound #8 (Foot)  Wound Laterality: Plantar, Right Cleanser Soap and Water Discharge Instruction: May shower and wash wound with dial antibacterial soap and water prior to dressing change. Wound Cleanser Discharge Instruction: Cleanse the wound with wound cleanser prior to applying a clean dressing using gauze sponges, not tissue or cotton balls. Peri-Wound Care Topical Primary Dressing Hydrofera Blue Ready Foam, 2.5 x2.5 in Discharge Instruction: Apply to wound bed as instructed Secondary Dressing Bordered Gauze, 4x4 in Discharge Instruction: Apply over primary dressing as directed. Optifoam Non-Adhesive Dressing, 4x4 in Discharge Instruction: Apply over primary dressing as directed. Secured With Compression Wrap Compression Stockings Environmental education officer) Signed: 04/19/2022 5:02:22 PM By: Sharyn Creamer RN, BSN Entered By: Sharyn Creamer on 04/19/2022 11:17:46 -------------------------------------------------------------------------------- [place] Details Patient Name: Date of Service: Jacob Katos R. 04/19/2022 11:00 A M Medical Record Number: [id number] Patient Account Number: [id number] Date of Birth/Sex: Treating RN: June 14, 1951 (71 y.o. Mare Ferrari Primary Care Karolynn Infantino: Alain Honey Other Clinician: Referring Gisella Alwine: Treating Chrystle Murillo/Extender: Neil Crouch in Treatment: 7 Vital Signs Time Taken: 11:11 Temperature (F): 99.1 Height (in): 74 Pulse (bpm): 91 Weight (lbs): 172 Respiratory Rate (breaths/min): 18 Body Mass Index (BMI): 22.1 Blood Pressure (mmHg): 97/61 Capillary Blood Glucose (mg/dl): 220 Reference Range: 80 - 120 mg / dl Electronic Signature(s) Signed: 04/19/2022 5:02:22 PM By: Sharyn Creamer RN, BSN Entered By: Sharyn Creamer on 04/19/2022 11:12:44

## 2022-04-21 NOTE — Progress Notes (Signed)
Jacob [name] ([id number]) Visit Report for 04/19/2022 Chief Complaint Document Details Patient Name: Date of Service: Jacob [name], Jacob [name] 04/19/2022 11:00 A M Medical Record Number: [id number] Patient Account Number: [id number] Date of Birth/Sex: Treating RN: 1951/04/30 (72 y.o. Jacob [name] Primary Care Provider: Alain Honey Other Clinician: Referring Provider: Treating Provider/Extender: Neil Crouch in Treatment: 7 Information Obtained from: Patient Chief Complaint Patient seen for complaints of Non-Healing Wounds x 3 Electronic Signature(s) Signed: 04/19/2022 11:33:09 AM By: Fredirick Maudlin MD FACS Entered By: Fredirick Maudlin on 04/19/2022 11:33:09 -------------------------------------------------------------------------------- Debridement Details Patient Name: Date of Service: Jacob Ache. 04/19/2022 11:00 A M Medical Record Number: [id number] Patient Account Number: [id number] Date of Birth/Sex: Treating RN: 05-10-1951 (71 y.o. Mare Ferrari Primary Care Provider: Alain Honey Other Clinician: Referring Provider: Treating Provider/Extender: Neil Crouch in Treatment: 7 Debridement Performed for Assessment: Wound #8 Right,Plantar Foot Performed By: Physician Fredirick Maudlin, MD Debridement Type: Debridement Severity of Tissue Pre Debridement: Fat layer exposed Level of Consciousness (Pre-procedure): Awake and Alert Pre-procedure Verification/Time Out Yes - 11:24 Taken: Start Time: 11:24 Pain Control: Lidocaine 4% T opical Solution T Area Debrided (L x W): otal 0.5 (cm) x 0.5 (cm) = 0.25 (cm) Tissue and other material debrided: Non-Viable, Callus, Slough, Subcutaneous, Slough Level: Skin/Subcutaneous Tissue Debridement Description: Excisional Instrument: Curette Bleeding: Minimum Hemostasis Achieved: Pressure Procedural Pain: 0 Post Procedural Pain: 0 Response to Treatment: Procedure was  tolerated well Level of Consciousness (Post- Awake and Alert procedure): Post Debridement Measurements of Total Wound Length: (cm) 0.5 Width: (cm) 0.5 Depth: (cm) 0.2 Volume: (cm) 0.039 Character of Wound/Ulcer Post Debridement: Improved Severity of Tissue Post Debridement: Fat layer exposed Post Procedure Diagnosis Same as Pre-procedure Electronic Signature(s) Signed: 04/19/2022 12:16:05 PM By: Fredirick Maudlin MD FACS Signed: 04/19/2022 5:02:22 PM By: Sharyn Creamer RN, BSN Entered By: Sharyn Creamer on 04/19/2022 11:25:44 -------------------------------------------------------------------------------- HPI Details Patient Name: Date of Service: Jacob Katos R. 04/19/2022 11:00 A M Medical Record Number: [id number] Patient Account Number: [id number] Date of Birth/Sex: Treating RN: 06-16-1951 (71 y.o. Jacob [name] Primary Care Provider: Alain Honey Other Clinician: Referring Provider: Treating Provider/Extender: Neil Crouch in Treatment: 7 History of Present Illness Location: 2 ulcers noted to the left hallux Quality: the patient denies pain Duration: these were originally blisters towards the end of February and opened in early March Context: these wounds are secondary to pressure; he got new shoes in February and also started wearing a toe cover/sleeve Modifying Factors: pressure and friction are exacerbating factor HPI Description: 01/12/17- Mr. Mcfarren arrives for initial evaluation of 2 ulcers to his left hallux. He states that back in February he did receive a new pair of diabetic shoes and at that time he started wearing toe cover/sleeves and noticed development of a blister to the dorsal and plantar aspect. He has been seen at [facility] by both Dr. Amalia Hailey and Dr. Jacqualyn Posey. He last saw Dr.Wagoner on 3/8 at which time he was prescribed Silvadene and Keflex. He completed that dose of Keflex and states that he got a phone call to extend  the Keflex prior to this appointment. He has a follow-up with Dr. Amalia Hailey on 3/26. He has been wearing a surgical shoe, no significant offloading to the plantar/medial ulcer but the strap does not extend over the dorsal ulcer. Topically he has been applying the Silvadene daily and a dry dressing. He is a diabetic, on  insulin, with a recent A1c of 6.2% (11/10/16). He is a current smoker, 0.5 PPD. He has no known diagnosis of arterial insufficiency. 01/20/17- The patient arrives for follow up evaluation of his ulcers to the left hallux. He admits to continuing to smoke approximately 0.5ppd. He states that his diet has been somewhat uncontrolled with higher than normal glucose levels, this seems circumstantial as he had a death in the family as food and schedule has been disrupted. he admits to wearing the surgical show with offloading modifications while out of the home, but has been ambulating barefoot around the home. he saw Dr. Amalia Hailey yesterday for toenail trimming and callus shaving to the right plantar foot and investigation of his ulcers. 01/26/17- he is here for follow-up evaluation of his left hallux ulcers. He states he has been significantly more compliant in the use of his offloading surgical shoe, with the exception of "getting up for a glass of water" he states that he has worn it with any ambulation. He states he has decreased his smoking too, admitting to only a cigarette since yesterday. He states his blood sugar this morning was 147 and yesterday was 116. He is voicing no complaints or concerns 02/02/17- He is here for follow-up evaluation of his left hallux ulcers. The dorsal ulcer is crusted over/healed. He states he has minimized his ambulation over this past week, much less than the previous week. He states his glucose levels have been between 120-150 about half of the time, otherwise greater than 150. He cannot correlate any specific dietary changes. He does have an appointment with a  dietitian next month. He was advised to keep a log of his glucose levels and his diet for that appointment. 02/06/17; patient comes in today for first application of the total contact cast. Her intake nurse reported the wounds look better therefore I did not actually see these before application of the cast. He'll return on Thursday for reapplication in the standard fashion 02/10/27- he is here for follow-up evaluation of his left plantar hallux ulcer. He came in Monday for application of a total contact cast. He states that he was tolerating that Monday, but noticed on Tuesday that his foot began to slide in the cast. He also is complaining of the heaviness of the cast, as he ambulates as his primary form of transportation. He continues to smoke,. He has not contacted his cardiologist regarding Nicorette gum. He states his blood sugars have been slightly elevated, this morning being 206. He states that he will complete an antibiotic tonight. He states this was prescribed by Dr. Jacqualyn Posey. He states that he has refilled this prescription twice, without direction to continue taking this medication. 02/16/17; patient wound about the same as last week per her description. Rolled edges of callus scan and nonviable subcutaneous tissue. Using Prisma. Apparently used topical antibiotic instead of the Prisma over the course of this week. He has not a total contact cast candidate secondary to ambulation needs and unsteadiness on his feet per discussion with our intake nurse 03/02/17 patient requires debridement. He has a small wound on the right great toe which probes however does not approach bone. What I can see of the base of this wound looks reasonably healthy and I think this is largely in offloading issue with the second toe crossing over the first. He did not tolerate a total contact cast has balance issues 5/171/8. 0.2x0.2x0.2 less depth. Using prisma 03/16/17; no major change. Still a small probing wound  with  copious amounts of surrounding callus skin nonviable subcutaneous tissue all of which debrided. Still using Prisma. I suspect the dimensions post debridement are about the same as listed above 0.2 x 0.2 x 0.2 03/23/17; no major change using Prisma. Once again a large amount of callus nonviable subcutaneous tissue around the wound. I went over the offloading issues with the patient and the nurse. He did not tolerate a total contact cast he uses public transportation among other issues. 03/30/17; no major change using Prisma. Once again a large amount of callus and nonviable tissue around the wound requires debridement. Saw Dr. Amalia Hailey who pared calluses on the other foot. 04/06/17; the wound is much smaller there is callus around this although I think I can see the base of the wound and I elected not to do any debridement. 04/13/17 this is a wound I did not debridement last week left callus intact. He arrives today with the area much in the same predicament however this time I remove the callus to find a deep probing hole. This is disappointing this is not go to bone. We have been using Prisma. 04/20/17 plantar left first toe. Again although a lot of circumferential callus around the wound which requires debridement however this does not appear to be a probing area. Culture I did last week show coag-negative staph. I thought this was probably not something that was worth treating but In the interim he was seen by his primary care with parotitis. He is now on clindamycin and Cipro and the Staphylococcus we cultured is Cipro sensitive [lugdunensis} 04/27/17; plantar left first toe. Again a lot of circumferential callus around a small open area. Removing the callus also nonviable subcutaneous tissue. The wound looks better today. He has completed his antibiotics for the parotitis. I didn't think additional antibiotics were necessary in terms of the wound 05/11/17; plantar first toe. I thought this might be  closed this week however once again he has a deep probing small hole. Using pickups and a scalpel debridement of thick callus and subcutaneous tissue from around the wound reveals a depth of about 0.4 cm. We have not been able to offload the area. There is no obvious infection here. No evidence of ischemia 05/18/17; since his last visit he's been in hospital for oral surgery apparently to remove a retained surgical screw. He has swelling of his jaw and has some discomfort. He arrives today with the plantar left first toe in the same predicament. No major change. Each time he comes in here he has thick adherent callus 05/25/17; arrives today with roughly the same condition of the plantar left great toe. Perhaps somewhat better less callus. There are no options for offloading. He is on his feet a lot and I think this is largely a pressure issue X-ray I did last week showed no evidence of osteomyelitis. He has a toe separator as the great toe tends to ride under the second toe when he is walking. I'm hopeful that this will allow for a little less pressure on the plantar aspect of the left great toe 06/07/2017 -- he is working hard on giving up smoking and other than that has been wearing his diabetic shoes and is also trying to offload as much as possible. 06/15/17; patient arrived in clinic today with a difficult area on his left medial great toe closed over. There is still amount of callus on this however I don't believe there is any underlying open wound. He has custom shoes with  inserts. I still think he is going to need to offload this area going forward. He also follows with Dr. Amalia Hailey of podiatry for routine foot care READMISSION 11/01/16; patient arrives in clinic today with a five-day history of a new area on the previously problematic left great toe. He is wearing his diabetic shoes and he apparently has a new pair coming. The patient is a type II diabetic with neuropathy. He follows with Dr. Amalia Hailey  of podiatry for pre-ulcerative callus on his bilateral feet last seen on 08/24/17. He has developed a large blister on top of his left great toe this is since gone down somewhat. He is not been dressing this specifically. He does not have a history of significant PAD previous ABIs in this clinic were1.1 11/06/17; left great toe has epithelialized since last time. I think this was caused by a insoles an issue pushing the toe against the dorsal aspect of the shoe itself has new diabetic shoes coming hopefully will have more forefoot depth. READMISSION 03/01/2022 This is a now 71 year old man with a past medical history significant for type 2 diabetes mellitus with end-stage renal disease on hemodialysis as as well as congestive heart failure, ischemic cardiomyopathy, hypertension, and prior diabetic foot ulcers. He has had multiple issues with his hemodialysis access. He had a prior left radiocephalic fistula that failed. In October 2020, he had a left brachiobasilic fistula. He apparently underwent a procedure at an outside vascular office on November 09, 2021. Subsequent to that, he developed a wound on his left posterior forearm just distal to the elbow as well as wounds on 2 of his left digits. He was diagnosed with steal syndrome and recently underwent ligation of his fistula with placement of a hemodialysis catheter. He is here today for assistance in healing the wounds that developed related to steal syndrome. He says that he has been applying Neosporin to the sites and they are fairly sensitive to the touch. 03/08/2022: The wounds on his fingers are much smaller and have just a little bit of accumulated thin eschar. The wound on his left posterior forearm is also smaller but has accumulated both slough and eschar. He also has a thick callus on his right fifth metatarsal head plantar surface and unfortunately, it appears that there is an open wound underneath the callus. We have been using Iodosorb  with Hydrofera Blue to all of his open wounds. Pain is much improved from last week. 03/15/2022: The wounds on his fingers have healed. The wound on his left posterior forearm is smaller and has a nice layer of granulation tissue with just a bit of accumulated slough. He has built up additional callus around the right fifth metatarsal head plantar surface, but the underlying wound has a good surface with granulation tissue present. We have been using Iodosorb with Hydrofera Blue. He did not endorse any pain this week. 03/22/2022: The wound on his left posterior forearm continues to contract. There is good granulation tissue present and just a little bit of periwound eschar and overlying slough. The right fifth metatarsal head wound has once again accumulated some callus and there is slough within the wound. The intake nurse measured a area of undermining from 7-10 o'clock. 03/29/2022: The patient saw podiatry this week for nail trim and they also pared back a number of calluses. The wound on his left posterior forearm is substantially smaller with just some overlying thin eschar. The right fifth metatarsal head has a bit of callus accumulation as well  as some slough in the wound bed but is otherwise clean. It is smaller today, as well. 04/05/2022: The wound on his left posterior forearm is nearly closed with just a small amount of overlying eschar. The right fifth metatarsal head wound is smaller again this week. There is some periwound callus accumulation and some slough in the wound bed. He is going to get new dialysis access placed next week. 04/19/2022: The wound on his left posterior forearm is closed. The right fifth metatarsal head wound is about the same size with periwound callus and slough accumulation. He did have a new right forearm PTFE AV graft placed last week. His right forearm and hand are quite swollen, warm, and red today. He was apparently seen in the emergency room yesterday and they  did not think it was infected and attributed the swelling to normal postop findings. Electronic Signature(s) Signed: 04/19/2022 11:34:36 AM By: Fredirick Maudlin MD FACS Entered By: Fredirick Maudlin on 04/19/2022 11:34:35 -------------------------------------------------------------------------------- Physical Exam Details Patient Name: Date of Service: Jacob Ache. 04/19/2022 11:00 A M Medical Record Number: [id number] Patient Account Number: [id number] Date of Birth/Sex: Treating RN: [birth year] (71 y.o. Jacob [name] Primary Care Provider: Alain Honey Other Clinician: Referring Provider: Treating Provider/Extender: Neil Crouch in Treatment: 7 Constitutional Within normal range for this patient. . . . No acute distress.[name] Respiratory Normal work of breathing on room air.. Notes 04/19/2022: The wound on his left posterior forearm is closed. The right fifth metatarsal head wound is about the same size with periwound callus and slough accumulation. Electronic Signature(s) Signed: 04/19/2022 11:35:55 AM By: Fredirick Maudlin MD FACS Entered By: Fredirick Maudlin on 04/19/2022 11:35:55 -------------------------------------------------------------------------------- Physician Orders Details Patient Name: Date of Service: Jacob Ache. 04/19/2022 11:00 A M Medical Record Number: [id number] Patient Account Number: [id number] Date of Birth/Sex: Treating RN: 05-Apr-1951 (72 y.o. Mare Ferrari Primary Care Provider: Alain Honey Other Clinician: Referring Provider: Treating Provider/Extender: Neil Crouch in Treatment: 7 Verbal / Phone Orders: No Diagnosis Coding ICD-10 Coding Code Description [phone] Non-pressure chronic ulcer of other part of right foot with fat layer exposed E11.22 Type 2 diabetes mellitus with diabetic chronic kidney disease J44.9 Chronic obstructive pulmonary disease, unspecified I50.42  Chronic combined systolic (congestive) and diastolic (congestive) heart failure I10 Essential (primary) hypertension N25.81 Secondary hyperparathyroidism of renal origin N18.6 End stage renal disease B20 Human immunodeficiency virus [HIV] disease Follow-up Appointments ppointment in 2 weeks. - Dr Celine Ahr Room 3 Return A Bathing/ Shower/ Hygiene Other Bathing/Shower/Hygiene Orders/Instructions: - Change dressing after bathing Wound Treatment Wound #8 - Foot Wound Laterality: Plantar, Right Cleanser: Soap and Water Discharge Instructions: May shower and wash wound with dial antibacterial soap and water prior to dressing change. Cleanser: Wound Cleanser (Generic) Discharge Instructions: Cleanse the wound with wound cleanser prior to applying a clean dressing using gauze sponges, not tissue or cotton balls. Prim Dressing: Hydrofera Blue Ready Foam, 2.5 x2.5 in (Generic) ary Discharge Instructions: Apply to wound bed as instructed Secondary Dressing: Bordered Gauze, 4x4 in (Generic) Discharge Instructions: Apply over primary dressing as directed. Secondary Dressing: Optifoam Non-Adhesive Dressing, 4x4 in Discharge Instructions: Apply over primary dressing as directed. Electronic Signature(s) Signed: 04/19/2022 11:36:08 AM By: Fredirick Maudlin MD FACS Entered By: Fredirick Maudlin on 04/19/2022 11:36:07 -------------------------------------------------------------------------------- Problem List Details Patient Name: Date of Service: Jacob Ache. 04/19/2022 11:00 A M Medical Record Number: [id number] Patient Account Number: [id number] Date of Birth/Sex: Treating RN: [birth year] (  71 y.o. Jacob [name] Primary Care Provider: Alain Honey Other Clinician: Referring Provider: Treating Provider/Extender: Neil Crouch in Treatment: 7 Active Problems ICD-10 Encounter Code Description Active Date MDM Diagnosis [phone] Non-pressure chronic ulcer of  other part of right foot with fat layer exposed 03/08/2022 No Yes E11.22 Type 2 diabetes mellitus with diabetic chronic kidney disease 03/01/2022 No Yes J44.9 Chronic obstructive pulmonary disease, unspecified 03/01/2022 No Yes I50.42 Chronic combined systolic (congestive) and diastolic (congestive) heart failure 03/01/2022 No Yes I10 Essential (primary) hypertension 03/01/2022 No Yes N25.81 Secondary hyperparathyroidism of renal origin 03/01/2022 No Yes N18.6 End stage renal disease 03/01/2022 No Yes B20 Human immunodeficiency virus [HIV] disease 03/01/2022 No Yes Inactive Problems Resolved Problems ICD-10 Code Description Active Date Resolved Date L98.499 Non-pressure chronic ulcer of skin of other sites with unspecified severity 03/01/2022 03/01/2022 Electronic Signature(s) Signed: 04/19/2022 11:32:58 AM By: Fredirick Maudlin MD FACS Entered By: Fredirick Maudlin on 04/19/2022 11:32:57 -------------------------------------------------------------------------------- Progress Note Details Patient Name: Date of Service: Jacob Katos R. 04/19/2022 11:00 A M Medical Record Number: [id number] Patient Account Number: [id number] Date of Birth/Sex: Treating RN: [birth year] (71 y.o. Jacob [name] Primary Care Provider: Alain Honey Other Clinician: Referring Provider: Treating Provider/Extender: Neil Crouch in Treatment: 7 Subjective Chief Complaint Information obtained from Patient Patient seen for complaints of Non-Healing Wounds x 3 History of Present Illness (HPI) The following HPI elements were documented for the patient's wound: Location: 2 ulcers noted to the left hallux Quality: the patient denies pain Duration: these were originally blisters towards the end of February and opened in early March Context: these wounds are secondary to pressure; he got new shoes in February and also started wearing a toe cover/sleeve Modifying Factors: pressure and friction are  exacerbating factor 01/12/17- Mr. Endsley arrives for initial evaluation of 2 ulcers to his left hallux. He states that back in February he did receive a new pair of diabetic shoes and at that time he started wearing toe cover/sleeves and noticed development of a blister to the dorsal and plantar aspect. He has been seen at [facility] by both Dr. Amalia Hailey and Dr. Jacqualyn Posey. He last saw Dr.Wagoner on 3/8 at which time he was prescribed Silvadene and Keflex. He completed that dose of Keflex and states that he got a phone call to extend the Keflex prior to this appointment. He has a follow-up with Dr. Amalia Hailey on 3/26. He has been wearing a surgical shoe, no significant offloading to the plantar/medial ulcer but the strap does not extend over the dorsal ulcer. T opically he has been applying the Silvadene daily and a dry dressing. He is a diabetic, on insulin, with a recent A1c of 6.2% (11/10/16). He is a current smoker, 0.5 PPD. He has no known diagnosis of arterial insufficiency. 01/20/17- The patient arrives for follow up evaluation of his ulcers to the left hallux. He admits to continuing to smoke approximately 0.5ppd. He states that his diet has been somewhat uncontrolled with higher than normal glucose levels, this seems circumstantial as he had a death in the family as food and schedule has been disrupted. he admits to wearing the surgical show with offloading modifications while out of the home, but has been ambulating barefoot around the home. he saw Dr. Amalia Hailey yesterday for toenail trimming and callus shaving to the right plantar foot and investigation of his ulcers. 01/26/17- he is here for follow-up evaluation of his left hallux ulcers. He states  he has been significantly more compliant in the use of his offloading surgical shoe, with the exception of "getting up for a glass of water" he states that he has worn it with any ambulation. He states he has decreased his smoking too, admitting to only a  cigarette since yesterday. He states his blood sugar this morning was 147 and yesterday was 116. He is voicing no complaints or concerns 02/02/17- He is here for follow-up evaluation of his left hallux ulcers. The dorsal ulcer is crusted over/healed. He states he has minimized his ambulation over this past week, much less than the previous week. He states his glucose levels have been between 120-150 about half of the time, otherwise greater than 150. He cannot correlate any specific dietary changes. He does have an appointment with a dietitian next month. He was advised to keep a log of his glucose levels and his diet for that appointment. 02/06/17; patient comes in today for first application of the total contact cast. Her intake nurse reported the wounds look better therefore I did not actually see these before application of the cast. He'll return on Thursday for reapplication in the standard fashion 02/10/27- he is here for follow-up evaluation of his left plantar hallux ulcer. He came in Monday for application of a total contact cast. He states that he was tolerating that Monday, but noticed on Tuesday that his foot began to slide in the cast. He also is complaining of the heaviness of the cast, as he ambulates as his primary form of transportation. He continues to smoke,. He has not contacted his cardiologist regarding Nicorette gum. He states his blood sugars have been slightly elevated, this morning being 206. He states that he will complete an antibiotic tonight. He states this was prescribed by Dr. Jacqualyn Posey. He states that he has refilled this prescription twice, without direction to continue taking this medication. 02/16/17; patient wound about the same as last week per her description. Rolled edges of callus scan and nonviable subcutaneous tissue. Using Prisma. Apparently used topical antibiotic instead of the Prisma over the course of this week. He has not a total contact cast candidate  secondary to ambulation needs and unsteadiness on his feet per discussion with our intake nurse 03/02/17 patient requires debridement. He has a small wound on the right great toe which probes however does not approach bone. What I can see of the base of this wound looks reasonably healthy and I think this is largely in offloading issue with the second toe crossing over the first. He did not tolerate a total contact cast has balance issues 5/171/8. 0.2x0.2x0.2 less depth. Using prisma 03/16/17; no major change. Still a small probing wound with copious amounts of surrounding callus skin nonviable subcutaneous tissue all of which debrided. Still using Prisma. I suspect the dimensions post debridement are about the same as listed above 0.2 x 0.2 x 0.2 03/23/17; no major change using Prisma. Once again a large amount of callus nonviable subcutaneous tissue around the wound. I went over the offloading issues with the patient and the nurse. He did not tolerate a total contact cast he uses public transportation among other issues. 03/30/17; no major change using Prisma. Once again a large amount of callus and nonviable tissue around the wound requires debridement. Saw Dr. Amalia Hailey who pared calluses on the other foot. 04/06/17; the wound is much smaller there is callus around this although I think I can see the base of the wound and I elected not  to do any debridement. 04/13/17 this is a wound I did not debridement last week left callus intact. He arrives today with the area much in the same predicament however this time I remove the callus to find a deep probing hole. This is disappointing this is not go to bone. We have been using Prisma. 04/20/17 plantar left first toe. Again although a lot of circumferential callus around the wound which requires debridement however this does not appear to be a probing area. Culture I did last week show coag-negative staph. I thought this was probably not something that was worth  treating but In the interim he was seen by his primary care with parotitis. He is now on clindamycin and Cipro and the Staphylococcus we cultured is Cipro sensitive [lugdunensis} 04/27/17; plantar left first toe. Again a lot of circumferential callus around a small open area. Removing the callus also nonviable subcutaneous tissue. The wound looks better today. He has completed his antibiotics for the parotitis. I didn't think additional antibiotics were necessary in terms of the wound 05/11/17; plantar first toe. I thought this might be closed this week however once again he has a deep probing small hole. Using pickups and a scalpel debridement of thick callus and subcutaneous tissue from around the wound reveals a depth of about 0.4 cm. We have not been able to offload the area. There is no obvious infection here. No evidence of ischemia 05/18/17; since his last visit he's been in hospital for oral surgery apparently to remove a retained surgical screw. He has swelling of his jaw and has some discomfort. He arrives today with the plantar left first toe in the same predicament. No major change. Each time he comes in here he has thick adherent callus 05/25/17; arrives today with roughly the same condition of the plantar left great toe. Perhaps somewhat better less callus. There are no options for offloading. He is on his feet a lot and I think this is largely a pressure issue X-ray I did last week showed no evidence of osteomyelitis. He has a toe separator as the great toe tends to ride under the second toe when he is walking. I'm hopeful that this will allow for a little less pressure on the plantar aspect of the left great toe 06/07/2017 -- he is working hard on giving up smoking and other than that has been wearing his diabetic shoes and is also trying to offload as much as possible. 06/15/17; patient arrived in clinic today with a difficult area on his left medial great toe closed over. There is still  amount of callus on this however I don't believe there is any underlying open wound. He has custom shoes with inserts. I still think he is going to need to offload this area going forward. He also follows with Dr. Amalia Hailey of podiatry for routine foot care READMISSION 11/01/16; patient arrives in clinic today with a five-day history of a new area on the previously problematic left great toe. He is wearing his diabetic shoes and he apparently has a new pair coming. The patient is a type II diabetic with neuropathy. He follows with Dr. Amalia Hailey of podiatry for pre-ulcerative callus on his bilateral feet last seen on 08/24/17. He has developed a large blister on top of his left great toe this is since gone down somewhat. He is not been dressing this specifically. He does not have a history of significant PAD previous ABIs in this clinic were1.1 11/06/17; left great toe has  epithelialized since last time. I think this was caused by a insoles an issue pushing the toe against the dorsal aspect of the shoe itself has new diabetic shoes coming hopefully will have more forefoot depth. READMISSION 03/01/2022 This is a now 71 year old man with a past medical history significant for type 2 diabetes mellitus with end-stage renal disease on hemodialysis as as well as congestive heart failure, ischemic cardiomyopathy, hypertension, and prior diabetic foot ulcers. He has had multiple issues with his hemodialysis access. He had a prior left radiocephalic fistula that failed. In October 2020, he had a left brachiobasilic fistula. He apparently underwent a procedure at an outside vascular office on November 09, 2021. Subsequent to that, he developed a wound on his left posterior forearm just distal to the elbow as well as wounds on 2 of his left digits. He was diagnosed with steal syndrome and recently underwent ligation of his fistula with placement of a hemodialysis catheter. He is here today for assistance in healing the  wounds that developed related to steal syndrome. He says that he has been applying Neosporin to the sites and they are fairly sensitive to the touch. 03/08/2022: The wounds on his fingers are much smaller and have just a little bit of accumulated thin eschar. The wound on his left posterior forearm is also smaller but has accumulated both slough and eschar. He also has a thick callus on his right fifth metatarsal head plantar surface and unfortunately, it appears that there is an open wound underneath the callus. We have been using Iodosorb with Hydrofera Blue to all of his open wounds. Pain is much improved from last week. 03/15/2022: The wounds on his fingers have healed. The wound on his left posterior forearm is smaller and has a nice layer of granulation tissue with just a bit of accumulated slough. He has built up additional callus around the right fifth metatarsal head plantar surface, but the underlying wound has a good surface with granulation tissue present. We have been using Iodosorb with Hydrofera Blue. He did not endorse any pain this week. 03/22/2022: The wound on his left posterior forearm continues to contract. There is good granulation tissue present and just a little bit of periwound eschar and overlying slough. The right fifth metatarsal head wound has once again accumulated some callus and there is slough within the wound. The intake nurse measured a area of undermining from 7-10 o'clock. 03/29/2022: The patient saw podiatry this week for nail trim and they also pared back a number of calluses. The wound on his left posterior forearm is substantially smaller with just some overlying thin eschar. The right fifth metatarsal head has a bit of callus accumulation as well as some slough in the wound bed but is otherwise clean. It is smaller today, as well. 04/05/2022: The wound on his left posterior forearm is nearly closed with just a small amount of overlying eschar. The right fifth  metatarsal head wound is smaller again this week. There is some periwound callus accumulation and some slough in the wound bed. He is going to get new dialysis access placed next week. 04/19/2022: The wound on his left posterior forearm is closed. The right fifth metatarsal head wound is about the same size with periwound callus and slough accumulation. He did have a new right forearm PTFE AV graft placed last week. His right forearm and hand are quite swollen, warm, and red today. He was apparently seen in the emergency room yesterday and they did not  think it was infected and attributed the swelling to normal postop findings. Patient History Information obtained from Patient. Family History Diabetes - Siblings,Father, Heart Disease - Father,Siblings, Hypertension - Father,Siblings, No family history of Cancer, Hereditary Spherocytosis, Kidney Disease, Lung Disease, Seizures, Stroke, Thyroid Problems, Tuberculosis. Social History Current every day smoker - 1/2 ppd / 52yr, Marital Status - Single, Alcohol Use - Moderate - 6 standard drinks per week, Drug Use - Prior History - Polysubstance Abuse; clean for 9 years, Caffeine Use - Rarely - tea. Medical History Eyes Patient has history of Cataracts - mild, Glaucoma Denies history of Optic Neuritis Ear/Nose/Mouth/Throat Denies history of Chronic sinus problems/congestion, Middle ear problems Hematologic/Lymphatic Patient has history of Anemia Denies history of Hemophilia, Human Immunodeficiency Virus, Lymphedema, Sickle Cell Disease Respiratory Patient has history of Chronic Obstructive Pulmonary Disease (COPD) Denies history of Aspiration, Asthma, Pneumothorax, Sleep Apnea, Tuberculosis Cardiovascular Patient has history of Congestive Heart Failure, Coronary Artery Disease, Hypertension, Myocardial Infarction - 2004 Denies history of Angina, Arrhythmia, Deep Vein Thrombosis, Hypotension, Peripheral Arterial Disease, Peripheral Venous  Disease, Phlebitis, Vasculitis Gastrointestinal Patient has history of Hepatitis C - 1968 Denies history of Cirrhosis , Colitis, Crohnoos, Hepatitis A, Hepatitis B Endocrine Patient has history of Type II Diabetes Denies history of Type I Diabetes Genitourinary Denies history of End Stage Renal Disease Immunological Denies history of Lupus Erythematosus, Raynaudoos, Scleroderma Integumentary (Skin) Denies history of History of Burn Musculoskeletal Patient has history of Gout, Osteoarthritis Denies history of Rheumatoid Arthritis, Osteomyelitis Neurologic Patient has history of Neuropathy Denies history of Dementia, Quadriplegia, Paraplegia, Seizure Disorder Oncologic Denies history of Received Chemotherapy, Received Radiation Psychiatric Denies history of Anorexia/bulimia, Confinement Anxiety Hospitalization/Surgery History - Colonoscopy. - CABG. - Laproscopic Cholecystectomy. - Left Hip Fx. - Left Jaw Fx. - Lumbar Laminectomy. Medical A Surgical History Notes nd Constitutional Symptoms (General Health) Insomnia Syncope Alcohol Dependence HIV Hematologic/Lymphatic Hyperlipidemia Hyperglycemia Cardiovascular CABG, ischemic cardiomyopathy Gastrointestinal GERD Genitourinary Syphilis CKD,Nocturia Immunological HIV Musculoskeletal Hip Fx BiLateral Bunions Unspecified Arthritis Neurologic chronic back pain Objective Constitutional Within normal range for this patient. No acute distress.. Vitals Time Taken: 11:11 AM, Height: 74 in, Weight: 172 lbs, BMI: 22.1, Temperature: 99.1 F, Pulse: 91 bpm, Respiratory Rate: 18 breaths/min, Blood Pressure: 97/61 mmHg, Capillary Blood Glucose: 220 mg/dl. Respiratory Normal work of breathing on room air.. General Notes: 04/19/2022: The wound on his left posterior forearm is closed. The right fifth metatarsal head wound is about the same size with periwound callus and slough accumulation. Integumentary (Hair, Skin) Wound #7 status is  Healed - Epithelialized. Original cause of wound was Blister. The date acquired was: 11/09/2021. The wound has been in treatment 7 weeks. The wound is located on the Left,Posterior Upper Arm. The wound measures 0cm length x 0cm width x 0cm depth; 0cm^2 area and 0cm^3 volume. There is no tunneling or undermining noted. There is a none present amount of drainage noted. The wound margin is flat and intact. There is no granulation within the wound bed. There is no necrotic tissue within the wound bed. Wound #8 status is Open. Original cause of wound was Blister. The date acquired was: 03/08/2022. The wound has been in treatment 6 weeks. The wound is located on the [place] The wound measures 0.4cm length x 0.4cm width x 0.2cm depth; 0.126cm^2 area and 0.025cm^3 volume. There is Fat Layer (Subcutaneous Tissue) exposed. There is no tunneling noted, however, there is undermining starting at 9:00 and ending at 3:00 with a maximum distance of 0.2cm. There is a  medium amount of serosanguineous drainage noted. The wound margin is thickened. There is large (67-100%) red granulation within the wound bed. There is a small (1-33%) amount of necrotic tissue within the wound bed including Adherent Slough. 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 Chronic obstructive pulmonary disease, unspecified Chronic combined systolic (congestive) and diastolic (congestive) heart failure Essential (primary) hypertension Secondary hyperparathyroidism of renal origin End stage renal disease Human immunodeficiency virus [HIV] disease Procedures Wound #8 Pre-procedure diagnosis of Wound #8 is a Diabetic Wound/Ulcer of the Lower Extremity located on the Right,Plantar Foot .Severity of Tissue Pre Debridement is: Fat layer exposed. There was a Excisional Skin/Subcutaneous Tissue Debridement  with a total area of 0.25 sq cm performed by Fredirick Maudlin, MD. With the following instrument(s): Curette to remove Non-Viable tissue/material. Material removed includes Callus, Subcutaneous Tissue, and Slough after achieving pain control using Lidocaine 4% T opical Solution. No specimens were taken. A time out was conducted at 11:24, prior to the start of the procedure. A Minimum amount of bleeding was controlled with Pressure. The procedure was tolerated well with a pain level of 0 throughout and a pain level of 0 following the procedure. Post Debridement Measurements: 0.5cm length x 0.5cm width x 0.2cm depth; 0.039cm^3 volume. Character of Wound/Ulcer Post Debridement is improved. Severity of Tissue Post Debridement is: Fat layer exposed. Post procedure Diagnosis Wound #8: Same as Pre-Procedure Plan Follow-up Appointments: Return Appointment in 2 weeks. - Dr Celine Ahr Room 3 Bathing/ Shower/ Hygiene: Other Bathing/Shower/Hygiene Orders/Instructions: - Change dressing after bathing WOUND #8: - Foot Wound Laterality: Plantar, Right Cleanser: Soap and Water Discharge Instructions: May shower and wash wound with dial antibacterial soap and water prior to dressing change. Cleanser: Wound Cleanser (Generic) Discharge Instructions: Cleanse the wound with wound cleanser prior to applying a clean dressing using gauze sponges, not tissue or cotton balls. Prim Dressing: Hydrofera Blue Ready Foam, 2.5 x2.5 in (Generic) ary Discharge Instructions: Apply to wound bed as instructed Secondary Dressing: Bordered Gauze, 4x4 in (Generic) Discharge Instructions: Apply over primary dressing as directed. Secondary Dressing: Optifoam Non-Adhesive Dressing, 4x4 in Discharge Instructions: Apply over primary dressing as directed. 04/19/2022: The wound on his left posterior forearm is closed. The right fifth metatarsal head wound is about the same size with periwound callus and slough accumulation. I used a curette  to debride slough, nonviable subcutaneous tissue, and periwound callus from the right fifth metatarsal head wound. We will continue using Hydrofera Blue and an Optifoam doughnut to protect the site. I did encourage him to contact his vascular surgeons office; due to the fact that he has PTFE in place, and infection could be a major complication for him. He will follow-up here in 1 week. Electronic Signature(s) Signed: 04/19/2022 11:37:06 AM By: Fredirick Maudlin MD FACS Entered By: Fredirick Maudlin on 04/19/2022 11:37:06 -------------------------------------------------------------------------------- HxROS Details Patient Name: Date of Service: Jacob Katos R. 04/19/2022 11:00 A M Medical Record Number: [id number] Patient Account Number: [id number] Date of Birth/Sex: Treating RN: 1951/09/08 (71 y.o. Jacob [name] Primary Care Provider: Alain Honey Other Clinician: Referring Provider: Treating Provider/Extender: Neil Crouch in Treatment: 7 Information Obtained From Patient Constitutional Symptoms (General Health) Medical History: Past Medical History Notes: Insomnia Syncope Alcohol Dependence HIV Eyes Medical History: Positive for: Cataracts - mild; Glaucoma Negative for: Optic Neuritis Ear/Nose/Mouth/Throat Medical History: Negative for: Chronic sinus problems/congestion; Middle ear problems  Hematologic/Lymphatic Medical History: Positive for: Anemia Negative for: Hemophilia; Human Immunodeficiency Virus; Lymphedema; Sickle Cell Disease Past Medical History Notes: Hyperlipidemia Hyperglycemia Respiratory Medical History: Positive for: Chronic Obstructive Pulmonary Disease (COPD) Negative for: Aspiration; Asthma; Pneumothorax; Sleep Apnea; Tuberculosis Cardiovascular Medical History: Positive for: Congestive Heart Failure; Coronary Artery Disease; Hypertension; Myocardial Infarction - 2004 Negative for: Angina; Arrhythmia; Deep Vein  Thrombosis; Hypotension; Peripheral Arterial Disease; Peripheral Venous Disease; Phlebitis; Vasculitis Past Medical History Notes: CABG, ischemic cardiomyopathy Gastrointestinal Medical History: Positive for: Hepatitis C - 1968 Negative for: Cirrhosis ; Colitis; Crohns; Hepatitis A; Hepatitis B Past Medical History Notes: GERD Endocrine Medical History: Positive for: Type II Diabetes Negative for: Type I Diabetes Time with diabetes: 13 yrs Treated with: Insulin Blood sugar tested every day: Yes Tested : 2 times per day Blood sugar testing results: Breakfast: 100; Dinner: 95-126 Genitourinary Medical History: Negative for: End Stage Renal Disease Past Medical History Notes: Syphilis CKD,Nocturia Immunological Medical History: Negative for: Lupus Erythematosus; Raynauds; Scleroderma Past Medical History Notes: HIV Integumentary (Skin) Medical History: Negative for: History of Burn Musculoskeletal Medical History: Positive for: Gout; Osteoarthritis Negative for: Rheumatoid Arthritis; Osteomyelitis Past Medical History Notes: Hip Fx BiLateral Bunions Unspecified Arthritis Neurologic Medical History: Positive for: Neuropathy Negative for: Dementia; Quadriplegia; Paraplegia; Seizure Disorder Past Medical History Notes: chronic back pain Oncologic Medical History: Negative for: Received Chemotherapy; Received Radiation Psychiatric Medical History: Negative for: Anorexia/bulimia; Confinement Anxiety HBO Extended History Items Eyes: Eyes: Cataracts Glaucoma Immunizations Pneumococcal Vaccine: Received Pneumococcal Vaccination: Yes Received Pneumococcal Vaccination On or After 60th Birthday: Yes Implantable Devices No devices added Hospitalization / Surgery History Type of Hospitalization/Surgery Colonoscopy CABG Laproscopic Cholecystectomy Left Hip Fx Left Jaw Fx Lumbar Laminectomy Family and Social History Cancer: No; Diabetes: Yes - Siblings,Father;  Heart Disease: Yes - Father,Siblings; Hereditary Spherocytosis: No; Hypertension: Yes - Father,Siblings; Kidney Disease: No; Lung Disease: No; Seizures: No; Stroke: No; Thyroid Problems: No; Tuberculosis: No; Current every day smoker - 1/2 ppd / 4yr; Marital Status - Single; Alcohol Use: Moderate - 6 standard drinks per week; Drug Use: Prior History - Polysubstance Abuse; clean for 9 years; Caffeine Use: Rarely - tea; Financial Concerns: No; Food, Clothing or Shelter Needs: No; Support System Lacking: No; Transportation Concerns: No Electronic Signature(s) Signed: 04/19/2022 12:16:05 PM By: CFredirick MaudlinMD FACS Signed: 04/20/2022 5:21:48 PM By: SDellie CatholicRN Entered By: CFredirick Maudlinon 04/19/2022 11:35:30 -------------------------------------------------------------------------------- SuperBill Details Patient Name: Date of Service: NRaechel Ache6/27/2023 Medical Record Number: [id number] Account Number: [id number] of Birth/Sex: Treating RN: 403-Feb-1952(71y.o. MCollene GobblePrimary Care Provider: MAlain HoneyOther Clinician: Referring Provider: Treating Provider/Extender: CNeil Crouchin Treatment: 7 Diagnosis Coding ICD-10 Codes Code Description [phone]-pressure chronic ulcer of other part of right foot with fat layer exposed E11.22 Type 2 diabetes mellitus with diabetic chronic kidney disease J44.9 Chronic obstructive pulmonary disease, unspecified I50.42 Chronic combined systolic (congestive) and diastolic (congestive) heart failure I10 Essential (primary) hypertension N25.81 Secondary hyperparathyroidism of renal origin N18.6 End stage renal disease B20 Human immunodeficiency virus [HIV] disease Facility Procedures CPT4 Code: [id number]: 11042 - DEB SUBQ TISSUE 20 SQ CM/< ICD-10 Diagnosis Description L97.512 Non-pressure chronic ulcer of other part of right foot with fat layer  exposed Modifier: Quantity: 1 Physician Procedures : CPT4 Code Description Modifier [id number]- WC PHYS LEVEL 3 - EST PT 25 ICD-10 Diagnosis Description L97.512 Non-pressure chronic ulcer of other part of right foot with fat layer exposed E11.22 Type  2 diabetes mellitus with diabetic chronic kidney  disease B20 Human immunodeficiency virus [HIV] disease N18.6 End stage renal disease Quantity: 1 : [id number] 11042 - WC PHYS SUBQ TISS 20 SQ CM ICD-10 Diagnosis Description L97.512 Non-pressure chronic ulcer of other part of right foot with fat layer exposed Quantity: 1 Electronic Signature(s) Signed: 04/19/2022 11:37:28 AM By: Fredirick Maudlin MD FACS Entered By: Fredirick Maudlin on 04/19/2022 11:37:27

## 2022-04-22 ENCOUNTER — Other Ambulatory Visit (HOSPITAL_COMMUNITY): Payer: Self-pay

## 2022-04-22 DIAGNOSIS — I129 Hypertensive chronic kidney disease with stage 1 through stage 4 chronic kidney disease, or unspecified chronic kidney disease: Secondary | ICD-10-CM | POA: Diagnosis not present

## 2022-04-22 DIAGNOSIS — N186 End stage renal disease: Secondary | ICD-10-CM | POA: Diagnosis not present

## 2022-04-22 DIAGNOSIS — Z992 Dependence on renal dialysis: Secondary | ICD-10-CM | POA: Diagnosis not present

## 2022-04-22 DIAGNOSIS — D631 Anemia in chronic kidney disease: Secondary | ICD-10-CM | POA: Diagnosis not present

## 2022-04-22 DIAGNOSIS — T8249XA Other complication of vascular dialysis catheter, initial encounter: Secondary | ICD-10-CM | POA: Diagnosis not present

## 2022-04-22 DIAGNOSIS — N2581 Secondary hyperparathyroidism of renal origin: Secondary | ICD-10-CM | POA: Diagnosis not present

## 2022-04-25 ENCOUNTER — Other Ambulatory Visit (HOSPITAL_COMMUNITY): Payer: Self-pay

## 2022-04-25 DIAGNOSIS — E1122 Type 2 diabetes mellitus with diabetic chronic kidney disease: Secondary | ICD-10-CM | POA: Diagnosis not present

## 2022-04-25 DIAGNOSIS — N186 End stage renal disease: Secondary | ICD-10-CM | POA: Diagnosis not present

## 2022-04-25 DIAGNOSIS — N2581 Secondary hyperparathyroidism of renal origin: Secondary | ICD-10-CM | POA: Diagnosis not present

## 2022-04-25 DIAGNOSIS — T8249XA Other complication of vascular dialysis catheter, initial encounter: Secondary | ICD-10-CM | POA: Diagnosis not present

## 2022-04-25 DIAGNOSIS — Z992 Dependence on renal dialysis: Secondary | ICD-10-CM | POA: Diagnosis not present

## 2022-04-25 DIAGNOSIS — D631 Anemia in chronic kidney disease: Secondary | ICD-10-CM | POA: Diagnosis not present

## 2022-04-25 DIAGNOSIS — E8779 Other fluid overload: Secondary | ICD-10-CM | POA: Diagnosis not present

## 2022-04-27 DIAGNOSIS — T8249XA Other complication of vascular dialysis catheter, initial encounter: Secondary | ICD-10-CM | POA: Diagnosis not present

## 2022-04-27 DIAGNOSIS — N2581 Secondary hyperparathyroidism of renal origin: Secondary | ICD-10-CM | POA: Diagnosis not present

## 2022-04-27 DIAGNOSIS — E8779 Other fluid overload: Secondary | ICD-10-CM | POA: Diagnosis not present

## 2022-04-27 DIAGNOSIS — D631 Anemia in chronic kidney disease: Secondary | ICD-10-CM | POA: Diagnosis not present

## 2022-04-27 DIAGNOSIS — Z992 Dependence on renal dialysis: Secondary | ICD-10-CM | POA: Diagnosis not present

## 2022-04-27 DIAGNOSIS — N186 End stage renal disease: Secondary | ICD-10-CM | POA: Diagnosis not present

## 2022-04-27 DIAGNOSIS — E1122 Type 2 diabetes mellitus with diabetic chronic kidney disease: Secondary | ICD-10-CM | POA: Diagnosis not present

## 2022-04-28 ENCOUNTER — Other Ambulatory Visit (HOSPITAL_COMMUNITY): Payer: Self-pay

## 2022-04-28 ENCOUNTER — Ambulatory Visit (HOSPITAL_COMMUNITY)
Admission: RE | Admit: 2022-04-28 | Discharge: 2022-04-28 | Disposition: A | Discharge status: Home / Self Care | Payer: Medicare Other | Source: Ambulatory Visit | Attending: Gastroenterology | Admitting: Gastroenterology

## 2022-04-28 ENCOUNTER — Encounter (HOSPITAL_BASED_OUTPATIENT_CLINIC_OR_DEPARTMENT_OTHER): Payer: Medicare Other | Attending: General Surgery | Admitting: General Surgery

## 2022-04-28 ENCOUNTER — Other Ambulatory Visit: Payer: Self-pay

## 2022-04-28 ENCOUNTER — Telehealth: Payer: Self-pay

## 2022-04-28 DIAGNOSIS — I5042 Chronic combined systolic (congestive) and diastolic (congestive) heart failure: Secondary | ICD-10-CM | POA: Insufficient documentation

## 2022-04-28 DIAGNOSIS — N186 End stage renal disease: Secondary | ICD-10-CM | POA: Diagnosis not present

## 2022-04-28 DIAGNOSIS — Q2730 Arteriovenous malformation, site unspecified: Secondary | ICD-10-CM

## 2022-04-28 DIAGNOSIS — I10 Essential (primary) hypertension: Secondary | ICD-10-CM | POA: Diagnosis not present

## 2022-04-28 DIAGNOSIS — B20 Human immunodeficiency virus [HIV] disease: Secondary | ICD-10-CM | POA: Insufficient documentation

## 2022-04-28 DIAGNOSIS — E1122 Type 2 diabetes mellitus with diabetic chronic kidney disease: Secondary | ICD-10-CM | POA: Diagnosis not present

## 2022-04-28 DIAGNOSIS — L97512 Non-pressure chronic ulcer of other part of right foot with fat layer exposed: Secondary | ICD-10-CM | POA: Insufficient documentation

## 2022-04-28 DIAGNOSIS — E11621 Type 2 diabetes mellitus with foot ulcer: Secondary | ICD-10-CM | POA: Diagnosis not present

## 2022-04-28 DIAGNOSIS — I132 Hypertensive heart and chronic kidney disease with heart failure and with stage 5 chronic kidney disease, or end stage renal disease: Secondary | ICD-10-CM | POA: Insufficient documentation

## 2022-04-28 DIAGNOSIS — J449 Chronic obstructive pulmonary disease, unspecified: Secondary | ICD-10-CM | POA: Insufficient documentation

## 2022-04-28 MED ORDER — OCTREOTIDE ACETATE 10 MG IM KIT
10.0000 mg | PACK | INTRAMUSCULAR | Status: DC
  Administered 2022-04-28: 10 mg via INTRAMUSCULAR
  Filled 2022-04-28: qty 1

## 2022-04-28 NOTE — Telephone Encounter (Signed)
New hospital orders for Octreotide 10 mg injection entered in epic.

## 2022-04-29 ENCOUNTER — Other Ambulatory Visit: Payer: Self-pay | Admitting: Nurse Practitioner

## 2022-04-29 DIAGNOSIS — E8779 Other fluid overload: Secondary | ICD-10-CM | POA: Diagnosis not present

## 2022-04-29 DIAGNOSIS — E1122 Type 2 diabetes mellitus with diabetic chronic kidney disease: Secondary | ICD-10-CM | POA: Diagnosis not present

## 2022-04-29 DIAGNOSIS — D631 Anemia in chronic kidney disease: Secondary | ICD-10-CM | POA: Diagnosis not present

## 2022-04-29 DIAGNOSIS — N186 End stage renal disease: Secondary | ICD-10-CM | POA: Diagnosis not present

## 2022-04-29 DIAGNOSIS — N2581 Secondary hyperparathyroidism of renal origin: Secondary | ICD-10-CM | POA: Diagnosis not present

## 2022-04-29 DIAGNOSIS — Z992 Dependence on renal dialysis: Secondary | ICD-10-CM | POA: Diagnosis not present

## 2022-04-29 DIAGNOSIS — T8249XA Other complication of vascular dialysis catheter, initial encounter: Secondary | ICD-10-CM | POA: Diagnosis not present

## 2022-04-29 NOTE — Progress Notes (Signed)
SEDALE, JENIFER ([id number]) Visit Report for 04/28/2022 Arrival Information Details Patient Name: Date of Service: JEANCARLOS, MARCHENA 04/28/2022 2:30 PM Medical Record Number: [id number] Patient Account Number: [id number] Date of Birth/Sex: Treating RN: Apr 06, 1951 (71 y.o. Janyth Contes Primary Care Debarah Mccumbers: Alain Honey Other Clinician: Referring Keyatta Tolles: Treating Caterine Mcmeans/Extender: Neil Crouch in Treatment: 8 Visit Information History Since Last Visit Added or deleted any medications: No Patient Arrived: [name] Any new allergies or adverse reactions: No Arrival Time: 14:34 Had a fall or experienced change in No Accompanied By: self activities of daily living that may affect Transfer Assistance: None risk of falls: Patient Identification Verified: Yes Signs or symptoms of abuse/neglect since last visito No Secondary Verification Process Completed: Yes Hospitalized since last visit: No Patient Requires Transmission-Based Precautions: No Implantable device outside of the clinic excluding No Patient Has Alerts: No cellular tissue based products placed in the center since last visit: Has Dressing in Place as Prescribed: Yes Pain Present Now: No Electronic Signature(s) Signed: 04/28/2022 4:34:44 PM By: Adline Peals Entered By: Adline Peals on 04/28/2022 14:36:20 -------------------------------------------------------------------------------- Encounter Discharge Information Details Patient Name: Date of Service: [name] 04/28/2022 2:30 PM Medical Record Number: [id number] Patient Account Number: [id number] Date of Birth/Sex: Treating RN: [birth year] (71 y.o. Janyth Contes Primary Care Iliza Blankenbeckler: Alain Honey Other Clinician: Referring Dayshawn Irizarry: Treating Kymora Sciara/Extender: Neil Crouch in Treatment: 8 Encounter Discharge Information Items Post Procedure Vitals Discharge Condition:  Stable Temperature (F): 97.6 Ambulatory Status: Cane Pulse (bpm): 76 Discharge Destination: Home Respiratory Rate (breaths/min): 18 Transportation: Private Auto Blood Pressure (mmHg): 104/56 Accompanied By: SELF Schedule Follow-up Appointment: Yes Clinical Summary of Care: Patient Declined Electronic Signature(s) Signed: 04/28/2022 4:34:44 PM By: Adline Peals Entered By: Adline Peals on 04/28/2022 15:43:03 -------------------------------------------------------------------------------- Lower Extremity Assessment Details Patient Name: Date of Service: [name] 04/28/2022 2:30 PM Medical Record Number: [id number] Patient Account Number: [id number] Date of Birth/Sex: Treating RN: 08-07-1951 (71 y.o. Janyth Contes Primary Care Derriana Oser: Alain Honey Other Clinician: Referring Walfred Bettendorf: Treating Mccayla Shimada/Extender: Neil Crouch in Treatment: 8 Edema Assessment Assessed: [name]: No] [Right: No] E[Left: dema] [Right: :] Calf Left: Right: Point of Measurement: From Medial Instep 34 cm Ankle Left: Right: Point of Measurement: From Medial Instep 20.5 cm Vascular Assessment Pulses: Dorsalis Pedis Palpable: [Right:Yes] Electronic Signature(s) Signed: 04/28/2022 4:34:44 PM By: Adline Peals Entered By: Adline Peals on 04/28/2022 14:37:05 -------------------------------------------------------------------------------- Multi Wound Chart Details Patient Name: Date of Service: [name] 04/28/2022 2:30 PM Medical Record Number: [id number] Patient Account Number: [id number] Date of Birth/Sex: Treating RN: January 05, 1951 (71 y.o. [name] Primary Care Ardel Jagger: Alain Honey Other Clinician: Referring Adanna Zuckerman: Treating Jennalyn Cawley/Extender: Neil Crouch in Treatment: 8 Vital Signs Height(in): 23 Pulse(bpm): 34 Weight(lbs): 172 Blood Pressure(mmHg): 104/56 Body Mass  Index(BMI): 22.1 Temperature(F): 97.6 Respiratory Rate(breaths/min): 18 Photos: [8:Right, Plantar Foot] [N/A:N/A N/A] Wound Location: [8:Blister] [N/A:N/A] Wounding Event: [8:Diabetic Wound/Ulcer of the Lower] [N/A:N/A] Primary Etiology: [8:Extremity Cataracts, Glaucoma, Anemia, Chronic N/A] Comorbid History: [8:Obstructive Pulmonary Disease (COPD), Congestive Heart Failure, Coronary Artery Disease, Hypertension, Myocardial Infarction, Hepatitis C, Type II Diabetes, Gout, Osteoarthritis, Neuropathy 03/08/2022] [N/A:N/A] Date Acquired: [8:7] [N/A:N/A] Weeks of Treatment: [8:Open] [N/A:N/A] Wound Status: [8:No] [N/A:N/A] Wound Recurrence: [8:0.7x0.6x0.2] [N/A:N/A] Measurements L x W x D (cm) [8:0.33] [N/A:N/A] A (cm) : rea [8:0.066] [N/A:N/A] Volume (cm) : [8:70.00%] [N/A:N/A] % Reduction in A [8:rea: 80.00%] [N/A:N/A] % Reduction in Volume: [  8:Grade 2] [N/A:N/A] Classification: [8:Medium] [N/A:N/A] Exudate A mount: [8:Serosanguineous] [N/A:N/A] Exudate Type: [8:red, brown] [N/A:N/A] Exudate Color: [8:Thickened] [N/A:N/A] Wound Margin: [8:Small (1-33%)] [N/A:N/A] Granulation A mount: [8:Red] [N/A:N/A] Granulation Quality: [8:Large (67-100%)] [N/A:N/A] Necrotic A mount: [8:Fat Layer (Subcutaneous Tissue): Yes N/A] Exposed Structures: [8:Fascia: No Tendon: No Muscle: No Joint: No Bone: No Small (1-33%)] [N/A:N/A] Epithelialization: [8:Debridement - Excisional] [N/A:N/A] Debridement: Pre-procedure Verification/Time Out 14:48 [N/A:N/A] Taken: [8:Lidocaine 4% Topical Solution] [N/A:N/A] Pain Control: [8:Callus, Subcutaneous, Slough] [N/A:N/A] Tissue Debrided: [8:Skin/Subcutaneous Tissue] [N/A:N/A] Level: [8:1] [N/A:N/A] Debridement A (sq cm): [8:rea Curette] [N/A:N/A] Instrument: [8:Minimum] [N/A:N/A] Bleeding: [8:Pressure] [N/A:N/A] Hemostasis A chieved: [8:0] [N/A:N/A] Procedural Pain: [8:0] [N/A:N/A] Post Procedural Pain: [8:Procedure was tolerated well] [N/A:N/A] Debridement  Treatment Response: [8:0.7x0.6x0.2] [N/A:N/A] Post Debridement Measurements L x W x D (cm) [8:0.066] [N/A:N/A] Post Debridement Volume: (cm) [8:Debridement] [N/A:N/A] Treatment Notes Electronic Signature(s) Signed: 04/28/2022 3:09:29 PM By: Fredirick Maudlin MD FACS Signed: 04/29/2022 5:53:51 PM By: Dellie Catholic RN Entered By: Fredirick Maudlin on 04/28/2022 15:09:28 -------------------------------------------------------------------------------- Multi-Disciplinary Care Plan Details Patient Name: Date of Service: [name] 04/28/2022 2:30 PM Medical Record Number: [id number] Patient Account Number: [id number] Date of Birth/Sex: Treating RN: 03/20/1951 (71 y.o. Janyth Contes Primary Care Gerritt Galentine: Alain Honey Other Clinician: Referring [employer] Belay: Treating Amarise Lillo/Extender: Neil Crouch in Treatment: 8 Active Inactive Wound/Skin Impairment Nursing Diagnoses: Impaired tissue integrity Goals: Patient/caregiver will verbalize understanding of skin care regimen Date Initiated: 03/01/2022 Target Resolution Date: 05/20/2022 Goal Status: Active Interventions: Assess ulceration(s) every visit Treatment Activities: Skin care regimen initiated : 03/01/2022 Notes: Electronic Signature(s) Signed: 04/28/2022 4:34:44 PM By: Adline Peals Entered By: Adline Peals on 04/28/2022 14:39:08 -------------------------------------------------------------------------------- Pain Assessment Details Patient Name: Date of Service: [name] 04/28/2022 2:30 PM Medical Record Number: [id number] Patient Account Number: [id number] Date of Birth/Sex: Treating RN: [birth year] (71 y.o. Janyth Contes Primary Care Rosene Pilling: Alain Honey Other Clinician: Referring Eniola Cerullo: Treating Melaina Howerton/Extender: Neil Crouch in Treatment: 8 Active Problems Location of Pain Severity and Description of Pain Patient Has  Paino No Site Locations Rate the pain. Current Pain Level: 0 Pain Management and Medication Current Pain Management: Electronic Signature(s) Signed: 04/28/2022 4:34:44 PM By: Adline Peals Entered By: Adline Peals on 04/28/2022 14:36:44 -------------------------------------------------------------------------------- Patient/Caregiver Education Details Patient Name: Date of Service: [name] 7/6/2023andnbsp2:30 PM Medical Record Number: [id number] Patient Account Number: [id number] Date of Birth/Gender: Treating RN: 1951-03-10 (71 y.o. Janyth Contes Primary Care Physician: Alain Honey Other Clinician: Referring Physician: Treating Physician/Extender: Neil Crouch in Treatment: 8 Education Assessment Education Provided To: Patient Education Topics Provided Wound/Skin Impairment: Methods: Explain/Verbal Responses: Reinforcements needed, State content correctly Electronic Signature(s) Signed: 04/28/2022 4:34:44 PM By: Adline Peals Entered By: Adline Peals on 04/28/2022 14:39:21 -------------------------------------------------------------------------------- Wound Assessment Details Patient Name: Date of Service: [name] 04/28/2022 2:30 PM Medical Record Number: [id number] Patient Account Number: [id number] Date of Birth/Sex: Treating RN: May 26, 1951 (71 y.o. Janyth Contes Primary Care Collin Hendley: Alain Honey Other Clinician: Referring Naketa Daddario: Treating Merranda Bolls/Extender: Neil Crouch in Treatment: 8 Wound Status Wound Number: 8 Primary Diabetic Wound/Ulcer of the Lower Extremity Etiology: Wound Location: Right, Plantar Foot Wound Open Wounding Event: Blister Status: Date Acquired: 03/08/2022 Comorbid Cataracts, Glaucoma, Anemia, Chronic Obstructive Pulmonary Weeks Of Treatment: 7 History: Disease (COPD), Congestive Heart Failure, Coronary  Artery Clustered Wound: No Disease, Hypertension, Myocardial Infarction, Hepatitis C, Type II Diabetes, Gout, Osteoarthritis, Neuropathy Photos Wound Measurements  Length: (cm) 0.7 Width: (cm) 0.6 Depth: (cm) 0.2 Area: (cm) 0.33 Volume: (cm) 0.066 % Reduction in Area: 70% % Reduction in Volume: 80% Epithelialization: Small (1-33%) Tunneling: No Undermining: No Wound Description Classification: Grade 2 Wound Margin: Thickened Exudate Amount: Medium Exudate Type: Serosanguineous Exudate Color: red, brown Foul Odor After Cleansing: No Slough/Fibrino Yes Wound Bed Granulation Amount: Small (1-33%) Exposed Structure Granulation Quality: Red Fascia Exposed: No Necrotic Amount: Large (67-100%) Fat Layer (Subcutaneous Tissue) Exposed: Yes Necrotic Quality: Adherent Slough Tendon Exposed: No Muscle Exposed: No Joint Exposed: No Bone Exposed: No Treatment Notes Wound #8 (Foot) Wound Laterality: Plantar, Right Cleanser Soap and Water Discharge Instruction: May shower and wash wound with dial antibacterial soap and water prior to dressing change. Wound Cleanser Discharge Instruction: Cleanse the wound with wound cleanser prior to applying a clean dressing using gauze sponges, not tissue or cotton balls. Peri-Wound Care Topical Primary Dressing KerraCel Ag Gelling Fiber Dressing, 2x2 in (silver alginate) Discharge Instruction: Apply silver alginate to wound bed as instructed Secondary Dressing Bordered Gauze, 4x4 in Discharge Instruction: Apply over primary dressing as directed. Optifoam Non-Adhesive Dressing, 4x4 in Discharge Instruction: Apply over primary dressing as directed. Secured With Compression Wrap Compression Stockings Environmental education officer) Signed: 04/28/2022 4:34:44 PM By: Adline Peals Entered By: Adline Peals on 04/28/2022 14:40:33 -------------------------------------------------------------------------------- Vitals Details Patient  Name: Date of Service: [name] 04/28/2022 2:30 PM Medical Record Number: [id number] Patient Account Number: [id number] Date of Birth/Sex: Treating RN: [birth year] (71 y.o. Janyth Contes Primary Care Zsazsa Bahena: Alain Honey Other Clinician: Referring Alee Katen: Treating Florina Glas/Extender: Neil Crouch in Treatment: 8 Vital Signs Time Taken: 14:35 Temperature (F): 97.6 Height (in): 74 Pulse (bpm): 76 Weight (lbs): 172 Respiratory Rate (breaths/min): 18 Body Mass Index (BMI): 22.1 Blood Pressure (mmHg): 104/56 Reference Range: 80 - 120 mg / dl Electronic Signature(s) Signed: 04/28/2022 4:34:44 PM By: Adline Peals Entered By: Adline Peals on 04/28/2022 14:36:38

## 2022-04-29 NOTE — Progress Notes (Signed)
LORIMER, TIBERIO ([id number]) Visit Report for 04/28/2022 Chief Complaint Document Details Patient Name: Date of Service: Jacob [name], SODERHOLM 04/28/2022 2:30 PM Medical Record Number: [id number] Patient Account Number: [id number] Date of Birth/Sex: Treating RN: May 01, 1951 (71 y.o. Jacob [name] Primary Care Provider: Alain Honey Other Clinician: Referring Provider: Treating Provider/Extender: Neil Crouch in Treatment: 8 Information Obtained from: Patient Chief Complaint Patient seen for complaints of Non-Healing Wounds x 3 Electronic Signature(s) Signed: 04/28/2022 3:09:37 PM By: Fredirick Maudlin MD FACS Entered By: Fredirick Maudlin on 04/28/2022 15:09:37 -------------------------------------------------------------------------------- Debridement Details Patient Name: Date of Service: Jacob [name]. 04/28/2022 2:30 PM Medical Record Number: [id number] Patient Account Number: [id number] Date of Birth/Sex: Treating RN: 29-Jun-1951 (71 y.o. Jacob [name] Primary Care Provider: Alain Honey Other Clinician: Referring Provider: Treating Provider/Extender: Neil Crouch in Treatment: 8 Debridement Performed for Assessment: Wound #8 Right,Plantar Foot Performed By: Physician Fredirick Maudlin, MD Debridement Type: Debridement Severity of Tissue Pre Debridement: Fat layer exposed Level of Consciousness (Pre-procedure): Awake and Alert Pre-procedure Verification/Time Out Yes - 14:48 Taken: Start Time: 14:48 Pain Control: Lidocaine 4% T opical Solution T Area Debrided (L x W): otal 1 (cm) x 1 (cm) = 1 (cm) Tissue and other material debrided: Viable, Non-Viable, Callus, Slough, Subcutaneous, Slough Level: Skin/Subcutaneous Tissue Debridement Description: Excisional Instrument: Curette Bleeding: Minimum Hemostasis Achieved: Pressure Procedural Pain: 0 Post Procedural Pain: 0 Response to Treatment: Procedure was  tolerated well Level of Consciousness (Post- Awake and Alert procedure): Post Debridement Measurements of Total Wound Length: (cm) 0.7 Width: (cm) 0.6 Depth: (cm) 0.2 Volume: (cm) 0.066 Character of Wound/Ulcer Post Debridement: Improved Severity of Tissue Post Debridement: Fat layer exposed Post Procedure Diagnosis Same as Pre-procedure Electronic Signature(s) Signed: 04/28/2022 3:26:24 PM By: Fredirick Maudlin MD FACS Signed: 04/28/2022 4:34:44 PM By: Adline Peals Entered By: Adline Peals on 04/28/2022 14:49:05 -------------------------------------------------------------------------------- HPI Details Patient Name: Date of Service: Jacob [name]. 04/28/2022 2:30 PM Medical Record Number: [id number] Patient Account Number: [id number] Date of Birth/Sex: Treating RN: [birth year] (71 y.o. Jacob [name] Primary Care Provider: Alain Honey Other Clinician: Referring Provider: Treating Provider/Extender: Neil Crouch in Treatment: 8 History of Present Illness Location: 2 ulcers noted to the left hallux Quality: the patient denies pain Duration: these were originally blisters towards the end of February and opened in early March Context: these wounds are secondary to pressure; he got new shoes in February and also started wearing a toe cover/sleeve Modifying Factors: pressure and friction are exacerbating factor HPI Description: 01/12/17- Mr. Shoultz arrives for initial evaluation of 2 ulcers to his left hallux. He states that back in February he did receive a new pair of diabetic shoes and at that time he started wearing toe cover/sleeves and noticed development of a blister to the dorsal and plantar aspect. He has been seen at [facility] by both Dr. Amalia Hailey and Dr. Jacqualyn Posey. He last saw Dr.Wagoner on 3/8 at which time he was prescribed Silvadene and Keflex. He completed that dose of Keflex and states that he got a phone call to extend the  Keflex prior to this appointment. He has a follow-up with Dr. Amalia Hailey on 3/26. He has been wearing a surgical shoe, no significant offloading to the plantar/medial ulcer but the strap does not extend over the dorsal ulcer. Topically he has been applying the Silvadene daily and a dry dressing. He is a diabetic, on insulin, with a recent  A1c of 6.2% (11/10/16). He is a current smoker, 0.5 PPD. He has no known diagnosis of arterial insufficiency. 01/20/17- The patient arrives for follow up evaluation of his ulcers to the left hallux. He admits to continuing to smoke approximately 0.5ppd. He states that his diet has been somewhat uncontrolled with higher than normal glucose levels, this seems circumstantial as he had a death in the family as food and schedule has been disrupted. he admits to wearing the surgical show with offloading modifications while out of the home, but has been ambulating barefoot around the home. he saw Dr. Amalia Hailey yesterday for toenail trimming and callus shaving to the right plantar foot and investigation of his ulcers. 01/26/17- he is here for follow-up evaluation of his left hallux ulcers. He states he has been significantly more compliant in the use of his offloading surgical shoe, with the exception of "getting up for a glass of water" he states that he has worn it with any ambulation. He states he has decreased his smoking too, admitting to only a cigarette since yesterday. He states his blood sugar this morning was 147 and yesterday was 116. He is voicing no complaints or concerns 02/02/17- He is here for follow-up evaluation of his left hallux ulcers. The dorsal ulcer is crusted over/healed. He states he has minimized his ambulation over this past week, much less than the previous week. He states his glucose levels have been between 120-150 about half of the time, otherwise greater than 150. He cannot correlate any specific dietary changes. He does have an appointment with a dietitian  next month. He was advised to keep a log of his glucose levels and his diet for that appointment. 02/06/17; patient comes in today for first application of the total contact cast. Her intake nurse reported the wounds look better therefore I did not actually see these before application of the cast. He'll return on Thursday for reapplication in the standard fashion 02/10/27- he is here for follow-up evaluation of his left plantar hallux ulcer. He came in Monday for application of a total contact cast. He states that he was tolerating that Monday, but noticed on Tuesday that his foot began to slide in the cast. He also is complaining of the heaviness of the cast, as he ambulates as his primary form of transportation. He continues to smoke,. He has not contacted his cardiologist regarding Nicorette gum. He states his blood sugars have been slightly elevated, this morning being 206. He states that he will complete an antibiotic tonight. He states this was prescribed by Dr. Jacqualyn Posey. He states that he has refilled this prescription twice, without direction to continue taking this medication. 02/16/17; patient wound about the same as last week per her description. Rolled edges of callus scan and nonviable subcutaneous tissue. Using Prisma. Apparently used topical antibiotic instead of the Prisma over the course of this week. He has not a total contact cast candidate secondary to ambulation needs and unsteadiness on his feet per discussion with our intake nurse 03/02/17 patient requires debridement. He has a small wound on the right great toe which probes however does not approach bone. What I can see of the base of this wound looks reasonably healthy and I think this is largely in offloading issue with the second toe crossing over the first. He did not tolerate a total contact cast has balance issues 5/171/8. 0.2x0.2x0.2 less depth. Using prisma 03/16/17; no major change. Still a small probing wound with copious  amounts of surrounding  callus skin nonviable subcutaneous tissue all of which debrided. Still using Prisma. I suspect the dimensions post debridement are about the same as listed above 0.2 x 0.2 x 0.2 03/23/17; no major change using Prisma. Once again a large amount of callus nonviable subcutaneous tissue around the wound. I went over the offloading issues with the patient and the nurse. He did not tolerate a total contact cast he uses public transportation among other issues. 03/30/17; no major change using Prisma. Once again a large amount of callus and nonviable tissue around the wound requires debridement. Saw Dr. Amalia Hailey who pared calluses on the other foot. 04/06/17; the wound is much smaller there is callus around this although I think I can see the base of the wound and I elected not to do any debridement. 04/13/17 this is a wound I did not debridement last week left callus intact. He arrives today with the area much in the same predicament however this time I remove the callus to find a deep probing hole. This is disappointing this is not go to bone. We have been using Prisma. 04/20/17 plantar left first toe. Again although a lot of circumferential callus around the wound which requires debridement however this does not appear to be a probing area. Culture I did last week show coag-negative staph. I thought this was probably not something that was worth treating but In the interim he was seen by his primary care with parotitis. He is now on clindamycin and Cipro and the Staphylococcus we cultured is Cipro sensitive [lugdunensis} 04/27/17; plantar left first toe. Again a lot of circumferential callus around a small open area. Removing the callus also nonviable subcutaneous tissue. The wound looks better today. He has completed his antibiotics for the parotitis. I didn't think additional antibiotics were necessary in terms of the wound 05/11/17; plantar first toe. I thought this might be closed this week  however once again he has a deep probing small hole. Using pickups and a scalpel debridement of thick callus and subcutaneous tissue from around the wound reveals a depth of about 0.4 cm. We have not been able to offload the area. There is no obvious infection here. No evidence of ischemia 05/18/17; since his last visit he's been in hospital for oral surgery apparently to remove a retained surgical screw. He has swelling of his jaw and has some discomfort. He arrives today with the plantar left first toe in the same predicament. No major change. Each time he comes in here he has thick adherent callus 05/25/17; arrives today with roughly the same condition of the plantar left great toe. Perhaps somewhat better less callus. There are no options for offloading. He is on his feet a lot and I think this is largely a pressure issue X-ray I did last week showed no evidence of osteomyelitis. He has a toe separator as the great toe tends to ride under the second toe when he is walking. I'm hopeful that this will allow for a little less pressure on the plantar aspect of the left great toe 06/07/2017 -- he is working hard on giving up smoking and other than that has been wearing his diabetic shoes and is also trying to offload as much as possible. 06/15/17; patient arrived in clinic today with a difficult area on his left medial great toe closed over. There is still amount of callus on this however I don't believe there is any underlying open wound. He has custom shoes with inserts. I still think  he is going to need to offload this area going forward. He also follows with Dr. Amalia Hailey of podiatry for routine foot care READMISSION 11/01/16; patient arrives in clinic today with a five-day history of a new area on the previously problematic left great toe. He is wearing his diabetic shoes and he apparently has a new pair coming. The patient is a type II diabetic with neuropathy. He follows with Dr. Amalia Hailey of podiatry for  pre-ulcerative callus on his bilateral feet last seen on 08/24/17. He has developed a large blister on top of his left great toe this is since gone down somewhat. He is not been dressing this specifically. He does not have a history of significant PAD previous ABIs in this clinic were1.1 11/06/17; left great toe has epithelialized since last time. I think this was caused by a insoles an issue pushing the toe against the dorsal aspect of the shoe itself has new diabetic shoes coming hopefully will have more forefoot depth. READMISSION 03/01/2022 This is a now 71 year old man with a past medical history significant for type 2 diabetes mellitus with end-stage renal disease on hemodialysis as as well as congestive heart failure, ischemic cardiomyopathy, hypertension, and prior diabetic foot ulcers. He has had multiple issues with his hemodialysis access. He had a prior left radiocephalic fistula that failed. In October 2020, he had a left brachiobasilic fistula. He apparently underwent a procedure at an outside vascular office on November 09, 2021. Subsequent to that, he developed a wound on his left posterior forearm just distal to the elbow as well as wounds on 2 of his left digits. He was diagnosed with steal syndrome and recently underwent ligation of his fistula with placement of a hemodialysis catheter. He is here today for assistance in healing the wounds that developed related to steal syndrome. He says that he has been applying Neosporin to the sites and they are fairly sensitive to the touch. 03/08/2022: The wounds on his fingers are much smaller and have just a little bit of accumulated thin eschar. The wound on his left posterior forearm is also smaller but has accumulated both slough and eschar. He also has a thick callus on his right fifth metatarsal head plantar surface and unfortunately, it appears that there is an open wound underneath the callus. We have been using Iodosorb with Hydrofera  Blue to all of his open wounds. Pain is much improved from last week. 03/15/2022: The wounds on his fingers have healed. The wound on his left posterior forearm is smaller and has a nice layer of granulation tissue with just a bit of accumulated slough. He has built up additional callus around the right fifth metatarsal head plantar surface, but the underlying wound has a good surface with granulation tissue present. We have been using Iodosorb with Hydrofera Blue. He did not endorse any pain this week. 03/22/2022: The wound on his left posterior forearm continues to contract. There is good granulation tissue present and just a little bit of periwound eschar and overlying slough. The right fifth metatarsal head wound has once again accumulated some callus and there is slough within the wound. The intake nurse measured a area of undermining from 7-10 o'clock. 03/29/2022: The patient saw podiatry this week for nail trim and they also pared back a number of calluses. The wound on his left posterior forearm is substantially smaller with just some overlying thin eschar. The right fifth metatarsal head has a bit of callus accumulation as well as some slough in  the wound bed but is otherwise clean. It is smaller today, as well. 04/05/2022: The wound on his left posterior forearm is nearly closed with just a small amount of overlying eschar. The right fifth metatarsal head wound is smaller again this week. There is some periwound callus accumulation and some slough in the wound bed. He is going to get new dialysis access placed next week. 04/19/2022: The wound on his left posterior forearm is closed. The right fifth metatarsal head wound is about the same size with periwound callus and slough accumulation. He did have a new right forearm PTFE AV graft placed last week. His right forearm and hand are quite swollen, warm, and red today. He was apparently seen in the emergency room yesterday and they did not think it  was infected and attributed the swelling to normal postop findings. 04/28/2022: The wound on his foot has accumulated a lot of periwound callus, but the actual open surface is quite small. There is just a little bit of slough on the surface. Electronic Signature(s) Signed: 04/28/2022 3:15:31 PM By: Fredirick Maudlin MD FACS Entered By: Fredirick Maudlin on 04/28/2022 15:15:31 -------------------------------------------------------------------------------- Physical Exam Details Patient Name: Date of Service: Jacob [name]. 04/28/2022 2:30 PM Medical Record Number: [id number] Patient Account Number: [id number] Date of Birth/Sex: Treating RN: Oct 31, 1950 (71 y.o. Jacob [name] Primary Care Provider: Alain Honey Other Clinician: Referring Provider: Treating Provider/Extender: Neil Crouch in Treatment: 8 Constitutional . . . . No acute distress.[name] Respiratory Normal work of breathing on room air.. Notes 04/28/2022: The wound on his foot has accumulated a lot of periwound callus, but the actual open surface is quite small. There is just a little bit of slough on the surface. Electronic Signature(s) Signed: 04/28/2022 3:15:59 PM By: Fredirick Maudlin MD FACS Entered By: Fredirick Maudlin on 04/28/2022 15:15:58 -------------------------------------------------------------------------------- Physician Orders Details Patient Name: Date of Service: Jacob [name]. 04/28/2022 2:30 PM Medical Record Number: [id number] Patient Account Number: [id number] Date of Birth/Sex: Treating RN: Jul 01, 1951 (71 y.o. Jacob [name] Primary Care Provider: Alain Honey Other Clinician: Referring Provider: Treating Provider/Extender: Neil Crouch in Treatment: 8 Verbal / Phone Orders: No Diagnosis Coding ICD-10 Coding Code Description [phone] Non-pressure chronic ulcer of other part of right foot with fat layer exposed E11.22 Type 2 diabetes  mellitus with diabetic chronic kidney disease J44.9 Chronic obstructive pulmonary disease, unspecified I50.42 Chronic combined systolic (congestive) and diastolic (congestive) heart failure I10 Essential (primary) hypertension N25.81 Secondary hyperparathyroidism of renal origin N18.6 End stage renal disease B20 Human immunodeficiency virus [HIV] disease Follow-up Appointments ppointment in 1 week. - Dr. Celine Ahr - Room 2 - 7/11 at 11:30 Return A Bathing/ Shower/ Hygiene Other Bathing/Shower/Hygiene Orders/Instructions: - Change dressing after bathing Wound Treatment Wound #8 - Foot Wound Laterality: Plantar, Right Cleanser: Soap and Water Every Other Day/7 Days Discharge Instructions: May shower and wash wound with dial antibacterial soap and water prior to dressing change. Cleanser: Wound Cleanser (Generic) Every Other Day/7 Days Discharge Instructions: Cleanse the wound with wound cleanser prior to applying a clean dressing using gauze sponges, not tissue or cotton balls. Prim Dressing: KerraCel Ag Gelling Fiber Dressing, 2x2 in (silver alginate) Every Other Day/7 Days ary Discharge Instructions: Apply silver alginate to wound bed as instructed Secondary Dressing: Bordered Gauze, 4x4 in (Generic) Every Other Day/7 Days Discharge Instructions: Apply over primary dressing as directed. Secondary Dressing: Optifoam Non-Adhesive Dressing, 4x4 in Every Other Day/7 Days Discharge Instructions: Apply  over primary dressing as directed. Electronic Signature(s) Signed: 04/28/2022 3:17:02 PM By: Fredirick Maudlin MD FACS Signed: 04/28/2022 3:17:02 PM By: Fredirick Maudlin MD FACS Entered By: Fredirick Maudlin on 04/28/2022 15:17:01 -------------------------------------------------------------------------------- Problem List Details Patient Name: Date of Service: Jacob [name]. 04/28/2022 2:30 PM Medical Record Number: [id number] Patient Account Number: [id number] Date of Birth/Sex: Treating  RN: 1951-05-16 (71 y.o. Jacob [name] Primary Care Provider: Alain Honey Other Clinician: Referring Provider: Treating Provider/Extender: Neil Crouch in Treatment: 8 Active Problems ICD-10 Encounter Code Description Active Date MDM Diagnosis [phone] Non-pressure chronic ulcer of other part of right foot with fat layer exposed 03/08/2022 No Yes E11.22 Type 2 diabetes mellitus with diabetic chronic kidney disease 03/01/2022 No Yes J44.9 Chronic obstructive pulmonary disease, unspecified 03/01/2022 No Yes I50.42 Chronic combined systolic (congestive) and diastolic (congestive) heart failure 03/01/2022 No Yes I10 Essential (primary) hypertension 03/01/2022 No Yes N25.81 Secondary hyperparathyroidism of renal origin 03/01/2022 No Yes N18.6 End stage renal disease 03/01/2022 No Yes B20 Human immunodeficiency virus [HIV] disease 03/01/2022 No Yes Inactive Problems Resolved Problems ICD-10 Code Description Active Date Resolved Date L98.499 Non-pressure chronic ulcer of skin of other sites with unspecified severity 03/01/2022 03/01/2022 Electronic Signature(s) Signed: 04/28/2022 3:09:21 PM By: Fredirick Maudlin MD FACS Entered By: Fredirick Maudlin on 04/28/2022 15:09:21 -------------------------------------------------------------------------------- Progress Note Details Patient Name: Date of Service: Jacob [name]. 04/28/2022 2:30 PM Medical Record Number: [id number] Patient Account Number: [id number] Date of Birth/Sex: Treating RN: 1951-04-23 (71 y.o. Jacob [name] Primary Care Provider: Alain Honey Other Clinician: Referring Provider: Treating Provider/Extender: Neil Crouch in Treatment: 8 Subjective Chief Complaint Information obtained from Patient Patient seen for complaints of Non-Healing Wounds x 3 History of Present Illness (HPI) The following HPI elements were documented for the patient's wound: Location: 2 ulcers noted  to the left hallux Quality: the patient denies pain Duration: these were originally blisters towards the end of February and opened in early March Context: these wounds are secondary to pressure; he got new shoes in February and also started wearing a toe cover/sleeve Modifying Factors: pressure and friction are exacerbating factor 01/12/17- Mr. Adinolfi arrives for initial evaluation of 2 ulcers to his left hallux. He states that back in February he did receive a new pair of diabetic shoes and at that time he started wearing toe cover/sleeves and noticed development of a blister to the dorsal and plantar aspect. He has been seen at [facility] by both Dr. Amalia Hailey and Dr. Jacqualyn Posey. He last saw Dr.Wagoner on 3/8 at which time he was prescribed Silvadene and Keflex. He completed that dose of Keflex and states that he got a phone call to extend the Keflex prior to this appointment. He has a follow-up with Dr. Amalia Hailey on 3/26. He has been wearing a surgical shoe, no significant offloading to the plantar/medial ulcer but the strap does not extend over the dorsal ulcer. T opically he has been applying the Silvadene daily and a dry dressing. He is a diabetic, on insulin, with a recent A1c of 6.2% (11/10/16). He is a current smoker, 0.5 PPD. He has no known diagnosis of arterial insufficiency. 01/20/17- The patient arrives for follow up evaluation of his ulcers to the left hallux. He admits to continuing to smoke approximately 0.5ppd. He states that his diet has been somewhat uncontrolled with higher than normal glucose levels, this seems circumstantial as he had a death in the family as food and schedule  has been disrupted. he admits to wearing the surgical show with offloading modifications while out of the home, but has been ambulating barefoot around the home. he saw Dr. Amalia Hailey yesterday for toenail trimming and callus shaving to the right plantar foot and investigation of his ulcers. 01/26/17- he is here for  follow-up evaluation of his left hallux ulcers. He states he has been significantly more compliant in the use of his offloading surgical shoe, with the exception of "getting up for a glass of water" he states that he has worn it with any ambulation. He states he has decreased his smoking too, admitting to only a cigarette since yesterday. He states his blood sugar this morning was 147 and yesterday was 116. He is voicing no complaints or concerns 02/02/17- He is here for follow-up evaluation of his left hallux ulcers. The dorsal ulcer is crusted over/healed. He states he has minimized his ambulation over this past week, much less than the previous week. He states his glucose levels have been between 120-150 about half of the time, otherwise greater than 150. He cannot correlate any specific dietary changes. He does have an appointment with a dietitian next month. He was advised to keep a log of his glucose levels and his diet for that appointment. 02/06/17; patient comes in today for first application of the total contact cast. Her intake nurse reported the wounds look better therefore I did not actually see these before application of the cast. He'll return on Thursday for reapplication in the standard fashion 02/10/27- he is here for follow-up evaluation of his left plantar hallux ulcer. He came in Monday for application of a total contact cast. He states that he was tolerating that Monday, but noticed on Tuesday that his foot began to slide in the cast. He also is complaining of the heaviness of the cast, as he ambulates as his primary form of transportation. He continues to smoke,. He has not contacted his cardiologist regarding Nicorette gum. He states his blood sugars have been slightly elevated, this morning being 206. He states that he will complete an antibiotic tonight. He states this was prescribed by Dr. Jacqualyn Posey. He states that he has refilled this prescription twice, without direction to  continue taking this medication. 02/16/17; patient wound about the same as last week per her description. Rolled edges of callus scan and nonviable subcutaneous tissue. Using Prisma. Apparently used topical antibiotic instead of the Prisma over the course of this week. He has not a total contact cast candidate secondary to ambulation needs and unsteadiness on his feet per discussion with our intake nurse 03/02/17 patient requires debridement. He has a small wound on the right great toe which probes however does not approach bone. What I can see of the base of this wound looks reasonably healthy and I think this is largely in offloading issue with the second toe crossing over the first. He did not tolerate a total contact cast has balance issues 5/171/8. 0.2x0.2x0.2 less depth. Using prisma 03/16/17; no major change. Still a small probing wound with copious amounts of surrounding callus skin nonviable subcutaneous tissue all of which debrided. Still using Prisma. I suspect the dimensions post debridement are about the same as listed above 0.2 x 0.2 x 0.2 03/23/17; no major change using Prisma. Once again a large amount of callus nonviable subcutaneous tissue around the wound. I went over the offloading issues with the patient and the nurse. He did not tolerate a total contact cast he uses public  transportation among other issues. 03/30/17; no major change using Prisma. Once again a large amount of callus and nonviable tissue around the wound requires debridement. Saw Dr. Amalia Hailey who pared calluses on the other foot. 04/06/17; the wound is much smaller there is callus around this although I think I can see the base of the wound and I elected not to do any debridement. 04/13/17 this is a wound I did not debridement last week left callus intact. He arrives today with the area much in the same predicament however this time I remove the callus to find a deep probing hole. This is disappointing this is not go to  bone. We have been using Prisma. 04/20/17 plantar left first toe. Again although a lot of circumferential callus around the wound which requires debridement however this does not appear to be a probing area. Culture I did last week show coag-negative staph. I thought this was probably not something that was worth treating but In the interim he was seen by his primary care with parotitis. He is now on clindamycin and Cipro and the Staphylococcus we cultured is Cipro sensitive [lugdunensis} 04/27/17; plantar left first toe. Again a lot of circumferential callus around a small open area. Removing the callus also nonviable subcutaneous tissue. The wound looks better today. He has completed his antibiotics for the parotitis. I didn't think additional antibiotics were necessary in terms of the wound 05/11/17; plantar first toe. I thought this might be closed this week however once again he has a deep probing small hole. Using pickups and a scalpel debridement of thick callus and subcutaneous tissue from around the wound reveals a depth of about 0.4 cm. We have not been able to offload the area. There is no obvious infection here. No evidence of ischemia 05/18/17; since his last visit he's been in hospital for oral surgery apparently to remove a retained surgical screw. He has swelling of his jaw and has some discomfort. He arrives today with the plantar left first toe in the same predicament. No major change. Each time he comes in here he has thick adherent callus 05/25/17; arrives today with roughly the same condition of the plantar left great toe. Perhaps somewhat better less callus. There are no options for offloading. He is on his feet a lot and I think this is largely a pressure issue X-ray I did last week showed no evidence of osteomyelitis. He has a toe separator as the great toe tends to ride under the second toe when he is walking. I'm hopeful that this will allow for a little less pressure on the plantar  aspect of the left great toe 06/07/2017 -- he is working hard on giving up smoking and other than that has been wearing his diabetic shoes and is also trying to offload as much as possible. 06/15/17; patient arrived in clinic today with a difficult area on his left medial great toe closed over. There is still amount of callus on this however I don't believe there is any underlying open wound. He has custom shoes with inserts. I still think he is going to need to offload this area going forward. He also follows with Dr. Amalia Hailey of podiatry for routine foot care READMISSION 11/01/16; patient arrives in clinic today with a five-day history of a new area on the previously problematic left great toe. He is wearing his diabetic shoes and he apparently has a new pair coming. The patient is a type II diabetic with neuropathy. He follows with  Dr. Amalia Hailey of podiatry for pre-ulcerative callus on his bilateral feet last seen on 08/24/17. He has developed a large blister on top of his left great toe this is since gone down somewhat. He is not been dressing this specifically. He does not have a history of significant PAD previous ABIs in this clinic were1.1 11/06/17; left great toe has epithelialized since last time. I think this was caused by a insoles an issue pushing the toe against the dorsal aspect of the shoe itself has new diabetic shoes coming hopefully will have more forefoot depth. READMISSION 03/01/2022 This is a now 71 year old man with a past medical history significant for type 2 diabetes mellitus with end-stage renal disease on hemodialysis as as well as congestive heart failure, ischemic cardiomyopathy, hypertension, and prior diabetic foot ulcers. He has had multiple issues with his hemodialysis access. He had a prior left radiocephalic fistula that failed. In October 2020, he had a left brachiobasilic fistula. He apparently underwent a procedure at an outside vascular office on November 09, 2021.  Subsequent to that, he developed a wound on his left posterior forearm just distal to the elbow as well as wounds on 2 of his left digits. He was diagnosed with steal syndrome and recently underwent ligation of his fistula with placement of a hemodialysis catheter. He is here today for assistance in healing the wounds that developed related to steal syndrome. He says that he has been applying Neosporin to the sites and they are fairly sensitive to the touch. 03/08/2022: The wounds on his fingers are much smaller and have just a little bit of accumulated thin eschar. The wound on his left posterior forearm is also smaller but has accumulated both slough and eschar. He also has a thick callus on his right fifth metatarsal head plantar surface and unfortunately, it appears that there is an open wound underneath the callus. We have been using Iodosorb with Hydrofera Blue to all of his open wounds. Pain is much improved from last week. 03/15/2022: The wounds on his fingers have healed. The wound on his left posterior forearm is smaller and has a nice layer of granulation tissue with just a bit of accumulated slough. He has built up additional callus around the right fifth metatarsal head plantar surface, but the underlying wound has a good surface with granulation tissue present. We have been using Iodosorb with Hydrofera Blue. He did not endorse any pain this week. 03/22/2022: The wound on his left posterior forearm continues to contract. There is good granulation tissue present and just a little bit of periwound eschar and overlying slough. The right fifth metatarsal head wound has once again accumulated some callus and there is slough within the wound. The intake nurse measured a area of undermining from 7-10 o'clock. 03/29/2022: The patient saw podiatry this week for nail trim and they also pared back a number of calluses. The wound on his left posterior forearm is substantially smaller with just some  overlying thin eschar. The right fifth metatarsal head has a bit of callus accumulation as well as some slough in the wound bed but is otherwise clean. It is smaller today, as well. 04/05/2022: The wound on his left posterior forearm is nearly closed with just a small amount of overlying eschar. The right fifth metatarsal head wound is smaller again this week. There is some periwound callus accumulation and some slough in the wound bed. He is going to get new dialysis access placed next week. 04/19/2022: The wound on  his left posterior forearm is closed. The right fifth metatarsal head wound is about the same size with periwound callus and slough accumulation. He did have a new right forearm PTFE AV graft placed last week. His right forearm and hand are quite swollen, warm, and red today. He was apparently seen in the emergency room yesterday and they did not think it was infected and attributed the swelling to normal postop findings. 04/28/2022: The wound on his foot has accumulated a lot of periwound callus, but the actual open surface is quite small. There is just a little bit of slough on the surface. Patient History Information obtained from Patient. Family History Diabetes - Siblings,Father, Heart Disease - Father,Siblings, Hypertension - Father,Siblings, No family history of Cancer, Hereditary Spherocytosis, Kidney Disease, Lung Disease, Seizures, Stroke, Thyroid Problems, Tuberculosis. Social History Current every day smoker - 1/2 ppd / 50yr, Marital Status - Single, Alcohol Use - Moderate - 6 standard drinks per week, Drug Use - Prior History - Polysubstance Abuse; clean for 9 years, Caffeine Use - Rarely - tea. Medical History Eyes Patient has history of Cataracts - mild, Glaucoma Denies history of Optic Neuritis Ear/Nose/Mouth/Throat Denies history of Chronic sinus problems/congestion, Middle ear problems Hematologic/Lymphatic Patient has history of Anemia Denies history of  Hemophilia, Human Immunodeficiency Virus, Lymphedema, Sickle Cell Disease Respiratory Patient has history of Chronic Obstructive Pulmonary Disease (COPD) Denies history of Aspiration, Asthma, Pneumothorax, Sleep Apnea, Tuberculosis Cardiovascular Patient has history of Congestive Heart Failure, Coronary Artery Disease, Hypertension, Myocardial Infarction - 2004 Denies history of Angina, Arrhythmia, Deep Vein Thrombosis, Hypotension, Peripheral Arterial Disease, Peripheral Venous Disease, Phlebitis, Vasculitis Gastrointestinal Patient has history of Hepatitis C - 1968 Denies history of Cirrhosis , Colitis, Crohnoos, Hepatitis A, Hepatitis B Endocrine Patient has history of Type II Diabetes Denies history of Type I Diabetes Genitourinary Denies history of End Stage Renal Disease Immunological Denies history of Lupus Erythematosus, Raynaudoos, Scleroderma Integumentary (Skin) Denies history of History of Burn Musculoskeletal Patient has history of Gout, Osteoarthritis Denies history of Rheumatoid Arthritis, Osteomyelitis Neurologic Patient has history of Neuropathy Denies history of Dementia, Quadriplegia, Paraplegia, Seizure Disorder Oncologic Denies history of Received Chemotherapy, Received Radiation Psychiatric Denies history of Anorexia/bulimia, Confinement Anxiety Hospitalization/Surgery History - Colonoscopy. - CABG. - Laproscopic Cholecystectomy. - Left Hip Fx. - Left Jaw Fx. - Lumbar Laminectomy. Medical A Surgical History Notes nd Constitutional Symptoms (General Health) Insomnia Syncope Alcohol Dependence HIV Hematologic/Lymphatic Hyperlipidemia Hyperglycemia Cardiovascular CABG, ischemic cardiomyopathy Gastrointestinal GERD Genitourinary Syphilis CKD,Nocturia Immunological HIV Musculoskeletal Hip Fx BiLateral Bunions Unspecified Arthritis Neurologic chronic back pain Objective Constitutional No acute distress.. Vitals Time Taken: 2:35 PM, Height: 74 in,  Weight: 172 lbs, BMI: 22.1, Temperature: 97.6 F, Pulse: 76 bpm, Respiratory Rate: 18 breaths/min, Blood Pressure: 104/56 mmHg. Respiratory Normal work of breathing on room air.. General Notes: 04/28/2022: The wound on his foot has accumulated a lot of periwound callus, but the actual open surface is quite small. There is just a little bit of slough on the surface. Integumentary (Hair, Skin) Wound #8 status is Open. Original cause of wound was Blister. The date acquired was: 03/08/2022. The wound has been in treatment 7 weeks. The wound is located on the [place] The wound measures 0.7cm length x 0.6cm width x 0.2cm depth; 0.33cm^2 area and 0.066cm^3 volume. There is Fat Layer (Subcutaneous Tissue) exposed. There is no tunneling or undermining noted. There is a medium amount of serosanguineous drainage noted. The wound margin is thickened. There is small (1-33%) red granulation within the  wound bed. There is a large (67-100%) amount of necrotic tissue within the wound bed including Adherent Slough. 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 Chronic obstructive pulmonary disease, unspecified Chronic combined systolic (congestive) and diastolic (congestive) heart failure Essential (primary) hypertension Secondary hyperparathyroidism of renal origin End stage renal disease Human immunodeficiency virus [HIV] disease Procedures Wound #8 Pre-procedure diagnosis of Wound #8 is a Diabetic Wound/Ulcer of the Lower Extremity located on the Right,Plantar Foot .Severity of Tissue Pre Debridement is: Fat layer exposed. There was a Excisional Skin/Subcutaneous Tissue Debridement with a total area of 1 sq cm performed by Fredirick Maudlin, MD. With the following instrument(s): Curette to remove Viable and Non-Viable tissue/material. Material removed includes Callus,  Subcutaneous Tissue, and Slough after achieving pain control using Lidocaine 4% T opical Solution. No specimens were taken. A time out was conducted at 14:48, prior to the start of the procedure. A Minimum amount of bleeding was controlled with Pressure. The procedure was tolerated well with a pain level of 0 throughout and a pain level of 0 following the procedure. Post Debridement Measurements: 0.7cm length x 0.6cm width x 0.2cm depth; 0.066cm^3 volume. Character of Wound/Ulcer Post Debridement is improved. Severity of Tissue Post Debridement is: Fat layer exposed. Post procedure Diagnosis Wound #8: Same as Pre-Procedure Plan Follow-up Appointments: Return Appointment in 1 week. - Dr. Celine Ahr - Room 2 - 7/11 at 11:30 Bathing/ Shower/ Hygiene: Other Bathing/Shower/Hygiene Orders/Instructions: - Change dressing after bathing WOUND #8: - Foot Wound Laterality: Plantar, Right Cleanser: Soap and Water Every Other Day/7 Days Discharge Instructions: May shower and wash wound with dial antibacterial soap and water prior to dressing change. Cleanser: Wound Cleanser (Generic) Every Other Day/7 Days Discharge Instructions: Cleanse the wound with wound cleanser prior to applying a clean dressing using gauze sponges, not tissue or cotton balls. Prim Dressing: KerraCel Ag Gelling Fiber Dressing, 2x2 in (silver alginate) Every Other Day/7 Days ary Discharge Instructions: Apply silver alginate to wound bed as instructed Secondary Dressing: Bordered Gauze, 4x4 in (Generic) Every Other Day/7 Days Discharge Instructions: Apply over primary dressing as directed. Secondary Dressing: Optifoam Non-Adhesive Dressing, 4x4 in Every Other Day/7 Days Discharge Instructions: Apply over primary dressing as directed. 04/28/2022: The wound on his foot has accumulated a lot of periwound callus, but the actual open surface is quite small. There is just a little bit of slough on the surface. I used a curette to debride  periwound callus, slough, and nonviable subcutaneous tissue from the wound. We have been using Hydrofera Blue for the entirety of his admission and I think we will change to silver alginate to try and push the wound to heal. During my debridement, I did take down the wound edges to try and stimulate the healing cascade. He will follow-up here in 1 week. Electronic Signature(s) Signed: 04/28/2022 3:17:59 PM By: Fredirick Maudlin MD FACS Entered By: Fredirick Maudlin on 04/28/2022 15:17:59 -------------------------------------------------------------------------------- HxROS Details Patient Name: Date of Service: Jacob [name]. 04/28/2022 2:30 PM Medical Record Number: [id number] Patient Account Number: [id number] Date of Birth/Sex: Treating RN: July 13, 1951 (71 y.o. Jacob [name] Primary Care Provider: Alain Honey Other Clinician: Referring Provider: Treating Provider/Extender: Neil Crouch in Treatment: 8 Information Obtained From Patient Constitutional Symptoms (General Health) Medical History: Past Medical History Notes: Insomnia Syncope Alcohol Dependence HIV Eyes Medical History: Positive for: Cataracts - mild; Glaucoma Negative  for: Optic Neuritis Ear/Nose/Mouth/Throat Medical History: Negative for: Chronic sinus problems/congestion; Middle ear problems Hematologic/Lymphatic Medical History: Positive for: Anemia Negative for: Hemophilia; Human Immunodeficiency Virus; Lymphedema; Sickle Cell Disease Past Medical History Notes: Hyperlipidemia Hyperglycemia Respiratory Medical History: Positive for: Chronic Obstructive Pulmonary Disease (COPD) Negative for: Aspiration; Asthma; Pneumothorax; Sleep Apnea; Tuberculosis Cardiovascular Medical History: Positive for: Congestive Heart Failure; Coronary Artery Disease; Hypertension; Myocardial Infarction - 2004 Negative for: Angina; Arrhythmia; Deep Vein Thrombosis; Hypotension; Peripheral  Arterial Disease; Peripheral Venous Disease; Phlebitis; Vasculitis Past Medical History Notes: CABG, ischemic cardiomyopathy Gastrointestinal Medical History: Positive for: Hepatitis C - 1968 Negative for: Cirrhosis ; Colitis; Crohns; Hepatitis A; Hepatitis B Past Medical History Notes: GERD Endocrine Medical History: Positive for: Type II Diabetes Negative for: Type I Diabetes Time with diabetes: 13 yrs Treated with: Insulin Blood sugar tested every day: Yes Tested : 2 times per day Blood sugar testing results: Breakfast: 100; Dinner: 95-126 Genitourinary Medical History: Negative for: End Stage Renal Disease Past Medical History Notes: Syphilis CKD,Nocturia Immunological Medical History: Negative for: Lupus Erythematosus; Raynauds; Scleroderma Past Medical History Notes: HIV Integumentary (Skin) Medical History: Negative for: History of Burn Musculoskeletal Medical History: Positive for: Gout; Osteoarthritis Negative for: Rheumatoid Arthritis; Osteomyelitis Past Medical History Notes: Hip Fx BiLateral Bunions Unspecified Arthritis Neurologic Medical History: Positive for: Neuropathy Negative for: Dementia; Quadriplegia; Paraplegia; Seizure Disorder Past Medical History Notes: chronic back pain Oncologic Medical History: Negative for: Received Chemotherapy; Received Radiation Psychiatric Medical History: Negative for: Anorexia/bulimia; Confinement Anxiety HBO Extended History Items Eyes: Eyes: Cataracts Glaucoma Immunizations Pneumococcal Vaccine: Received Pneumococcal Vaccination: Yes Received Pneumococcal Vaccination On or After 60th Birthday: Yes Implantable Devices No devices added Hospitalization / Surgery History Type of Hospitalization/Surgery Colonoscopy CABG Laproscopic Cholecystectomy Left Hip Fx Left Jaw Fx Lumbar Laminectomy Family and Social History Cancer: No; Diabetes: Yes - Siblings,Father; Heart Disease: Yes - Father,Siblings;  Hereditary Spherocytosis: No; Hypertension: Yes - Father,Siblings; Kidney Disease: No; Lung Disease: No; Seizures: No; Stroke: No; Thyroid Problems: No; Tuberculosis: No; Current every day smoker - 1/2 ppd / 61yr; Marital Status - Single; Alcohol Use: Moderate - 6 standard drinks per week; Drug Use: Prior History - Polysubstance Abuse; clean for 9 years; Caffeine Use: Rarely - tea; Financial Concerns: No; Food, Clothing or Shelter Needs: No; Support System Lacking: No; Transportation Concerns: No Electronic Signature(s) Signed: 04/28/2022 3:26:24 PM By: CFredirick MaudlinMD FACS Signed: 04/29/2022 5:53:51 PM By: SDellie CatholicRN Entered By: CFredirick Maudlinon 04/28/2022 15:15:38 -------------------------------------------------------------------------------- SuperBill Details Patient Name: Date of Service: NRaechel Ache7/03/2022 Medical Record Number: [id number] Account Number: [id number] of Birth/Sex: Treating RN: [birth year](71y.o. MCollene GobblePrimary Care Provider: MAlain HoneyOther Clinician: Referring Provider: Treating Provider/Extender: CNeil Crouchin Treatment: 8 Diagnosis Coding ICD-10 Codes Code Description [phone]-pressure chronic ulcer of other part of right foot with fat layer exposed E11.22 Type 2 diabetes mellitus with diabetic chronic kidney disease J44.9 Chronic obstructive pulmonary disease, unspecified I50.42 Chronic combined systolic (congestive) and diastolic (congestive) heart failure I10 Essential (primary) hypertension N25.81 Secondary hyperparathyroidism of renal origin N18.6 End stage renal disease B20 Human immunodeficiency virus [HIV] disease Facility Procedures CPT4 Code: [id number]: 11042 - DEB SUBQ TISSUE 20 SQ CM/< ICD-10 Diagnosis Description L97.512 Non-pressure chronic ulcer of other part of right foot with fat layer exposed Modifier: Quantity: 1 Physician Procedures : CPT4  Code Description Modifier [id number]- WC PHYS LEVEL 3 - EST PT 25 ICD-10 Diagnosis Description L97.512 Non-pressure  chronic ulcer of other part of right foot with fat layer exposed E11.22 Type 2 diabetes mellitus with diabetic chronic kidney  disease I50.42 Chronic combined systolic (congestive) and diastolic (congestive) heart failure B20 Human immunodeficiency virus [HIV] disease Quantity: 1 : [id number] 11042 - WC PHYS SUBQ TISS 20 SQ CM ICD-10 Diagnosis Description L97.512 Non-pressure chronic ulcer of other part of right foot with fat layer exposed Quantity: 1 Electronic Signature(s) Signed: 04/28/2022 3:18:38 PM By: Fredirick Maudlin MD FACS Entered By: Fredirick Maudlin on 04/28/2022 15:18:37

## 2022-05-02 ENCOUNTER — Telehealth: Payer: Self-pay

## 2022-05-02 ENCOUNTER — Telehealth: Payer: Self-pay | Admitting: *Deleted

## 2022-05-02 DIAGNOSIS — N186 End stage renal disease: Secondary | ICD-10-CM | POA: Diagnosis not present

## 2022-05-02 DIAGNOSIS — Z992 Dependence on renal dialysis: Secondary | ICD-10-CM | POA: Diagnosis not present

## 2022-05-02 DIAGNOSIS — T8249XA Other complication of vascular dialysis catheter, initial encounter: Secondary | ICD-10-CM | POA: Diagnosis not present

## 2022-05-02 DIAGNOSIS — D631 Anemia in chronic kidney disease: Secondary | ICD-10-CM | POA: Diagnosis not present

## 2022-05-02 DIAGNOSIS — E1122 Type 2 diabetes mellitus with diabetic chronic kidney disease: Secondary | ICD-10-CM | POA: Diagnosis not present

## 2022-05-02 DIAGNOSIS — N2581 Secondary hyperparathyroidism of renal origin: Secondary | ICD-10-CM | POA: Diagnosis not present

## 2022-05-02 DIAGNOSIS — E8779 Other fluid overload: Secondary | ICD-10-CM | POA: Diagnosis not present

## 2022-05-02 NOTE — Telephone Encounter (Signed)
CT scan results in the chart showed single tiny lung nodule measuring 2.5 mm on the right upper lobe of the lung. Repeat Low Ct scan in one year recommended.  - Aortic Atherosclerosis ( Build up of plaque on big blood vessel) also noted.continue on Atorvastatin.   - Emphysema also noted on CT scan.   - Too early to refill  percocet last refilled 04/14/2022.

## 2022-05-02 NOTE — Telephone Encounter (Signed)
Patient is calling requesting:  1.) Results from CT Scan done on 04/14/2022  2.)Refill on Oxycodone.  LR: 04/19/2022 Contract on File.   Pended and sent to [facility] for approval and Advise due to Dr. Sabra Heck out of office this week.

## 2022-05-02 NOTE — Telephone Encounter (Signed)
Pt called stating that he had questions and having some itching at site.  Reviewed pt's chart, returned pt's call, two identifiers used. Pt questioned if he could use Voltaren gel and he was advised to be careful with it and not use it directly on the incision so he didn't cause an infection. Confirmed understanding.

## 2022-05-02 NOTE — Telephone Encounter (Signed)
Patient notified.   Patient stated that he is going to run out of Oxycodone on Wednesday and needs a refill. Stated that he only gets enough for 5 days worth. Stated he Cannot go through the pain until Dr. Sabra Heck gets back next week.   Please Advise.

## 2022-05-03 ENCOUNTER — Ambulatory Visit: Payer: Medicare Other | Admitting: Podiatry

## 2022-05-03 ENCOUNTER — Ambulatory Visit (INDEPENDENT_AMBULATORY_CARE_PROVIDER_SITE_OTHER): Payer: Medicare Other

## 2022-05-03 ENCOUNTER — Encounter (HOSPITAL_BASED_OUTPATIENT_CLINIC_OR_DEPARTMENT_OTHER): Payer: Medicare Other | Admitting: General Surgery

## 2022-05-03 DIAGNOSIS — I5042 Chronic combined systolic (congestive) and diastolic (congestive) heart failure: Secondary | ICD-10-CM | POA: Diagnosis not present

## 2022-05-03 DIAGNOSIS — E1142 Type 2 diabetes mellitus with diabetic polyneuropathy: Secondary | ICD-10-CM

## 2022-05-03 DIAGNOSIS — M2041 Other hammer toe(s) (acquired), right foot: Secondary | ICD-10-CM | POA: Diagnosis not present

## 2022-05-03 DIAGNOSIS — M2042 Other hammer toe(s) (acquired), left foot: Secondary | ICD-10-CM

## 2022-05-03 DIAGNOSIS — E1122 Type 2 diabetes mellitus with diabetic chronic kidney disease: Secondary | ICD-10-CM | POA: Diagnosis not present

## 2022-05-03 DIAGNOSIS — M2012 Hallux valgus (acquired), left foot: Secondary | ICD-10-CM

## 2022-05-03 DIAGNOSIS — M2011 Hallux valgus (acquired), right foot: Secondary | ICD-10-CM

## 2022-05-03 DIAGNOSIS — E11621 Type 2 diabetes mellitus with foot ulcer: Secondary | ICD-10-CM | POA: Diagnosis not present

## 2022-05-03 DIAGNOSIS — E1140 Type 2 diabetes mellitus with diabetic neuropathy, unspecified: Secondary | ICD-10-CM | POA: Diagnosis not present

## 2022-05-03 DIAGNOSIS — I132 Hypertensive heart and chronic kidney disease with heart failure and with stage 5 chronic kidney disease, or end stage renal disease: Secondary | ICD-10-CM | POA: Diagnosis not present

## 2022-05-03 DIAGNOSIS — L97512 Non-pressure chronic ulcer of other part of right foot with fat layer exposed: Secondary | ICD-10-CM | POA: Diagnosis not present

## 2022-05-03 DIAGNOSIS — N186 End stage renal disease: Secondary | ICD-10-CM | POA: Diagnosis not present

## 2022-05-03 DIAGNOSIS — J449 Chronic obstructive pulmonary disease, unspecified: Secondary | ICD-10-CM | POA: Diagnosis not present

## 2022-05-03 DIAGNOSIS — L84 Corns and callosities: Secondary | ICD-10-CM | POA: Diagnosis not present

## 2022-05-03 MED ORDER — OXYCODONE-ACETAMINOPHEN 5-325 MG PO TABS: 1.0000 | ORAL_TABLET | Freq: Four times a day (QID) | ORAL | 0 refills | Status: DC | PRN

## 2022-05-03 NOTE — Telephone Encounter (Signed)
It's too early to refill ( 2 weeks ) medication from the last refill on 04/19/2022.

## 2022-05-03 NOTE — Progress Notes (Signed)
[name], [name] ([id number]) Visit Report for 05/03/2022 Arrival Information Details Patient Name: Date of Service: Jacob [name] 05/03/2022 11:30 A M Medical Record Number: [id number] Patient Account Number: [id number] Date of Birth/Sex: Treating RN: 08-17-1951 (71 y.o. Jacob [name] Primary Care Curry Seefeldt: Alain Honey Other Clinician: Referring Camerin Ladouceur: Treating Orpha Dain/Extender: Neil Crouch in Treatment: 9 Visit Information History Since Last Visit Added or deleted any medications: No Patient Arrived: Jacob [name] Any new allergies or adverse reactions: No Arrival Time: 11:48 Had a fall or experienced change in No Accompanied By: self activities of daily living that may affect Transfer Assistance: None risk of falls: Patient Identification Verified: Yes Signs or symptoms of abuse/neglect since last visito No Secondary Verification Process Completed: Yes Hospitalized since last visit: No Patient Requires Transmission-Based Precautions: No Implantable device outside of the clinic excluding No Patient Has Alerts: No cellular tissue based products placed in the center since last visit: Has Dressing in Place as Prescribed: Yes Pain Present Now: No Electronic Signature(s) Signed: 05/03/2022 4:46:37 PM By: Adline Peals Entered By: Adline Peals on 05/03/2022 11:51:24 -------------------------------------------------------------------------------- Encounter Discharge Information Details Patient Name: Date of Service: Jacob Katos R. 05/03/2022 11:30 A M Medical Record Number: [id number] Patient Account Number: [id number] Date of Birth/Sex: Treating RN: [birth year] (71 y.o. Jacob [name] Primary Care Aarin Bluett: Alain Honey Other Clinician: Referring Eveleigh Crumpler: Treating Dione Mccombie/Extender: Neil Crouch in Treatment: 9 Encounter Discharge Information Items Post Procedure Vitals Discharge  Condition: Stable Temperature (F): 97.8 Ambulatory Status: Cane Pulse (bpm): 88 Discharge Destination: Home Respiratory Rate (breaths/min): 18 Transportation: Private Auto Blood Pressure (mmHg): 100/62 Accompanied By: self Schedule Follow-up Appointment: Yes Clinical Summary of Care: Patient Declined Electronic Signature(s) Signed: 05/03/2022 4:46:37 PM By: Adline Peals Entered By: Adline Peals on 05/03/2022 12:11:45 -------------------------------------------------------------------------------- Lower Extremity Assessment Details Patient Name: Date of Service: Jacob [name] 05/03/2022 11:30 A M Medical Record Number: [id number] Patient Account Number: [id number] Date of Birth/Sex: Treating RN: 1951/02/02 (71 y.o. Jacob [name] Primary Care Stephan Draughn: Alain Honey Other Clinician: Referring Seanne Chirico: Treating Shyanne Mcclary/Extender: Neil Crouch in Treatment: 9 Edema Assessment Assessed: [name]: No] Patrice Paradise: No] E[Left: dema] [Right: :] Calf Left: Right: Point of Measurement: From Medial Instep 31.5 cm Ankle Left: Right: Point of Measurement: From Medial Instep 20.4 cm Vascular Assessment Pulses: Dorsalis Pedis Palpable: [Right:Yes] Electronic Signature(s) Signed: 05/03/2022 4:46:37 PM By: Adline Peals Entered By: Adline Peals on 05/03/2022 11:53:16 -------------------------------------------------------------------------------- Multi Wound Chart Details Patient Name: Date of Service: Jacob [name]. 05/03/2022 11:30 A M Medical Record Number: [id number] Patient Account Number: [id number] Date of Birth/Sex: Treating RN: 1951/07/02 (71 y.o. Jacob [name] Primary Care Faron Whitelock: Alain Honey Other Clinician: Referring Briselda Naval: Treating Devean Skoczylas/Extender: Neil Crouch in Treatment: 9 Vital Signs Height(in): 27 Pulse(bpm): 58 Weight(lbs): 172 Blood Pressure(mmHg):  100/62 Body Mass Index(BMI): 22.1 Temperature(F): 97.8 Respiratory Rate(breaths/min): 18 Photos: [8:No Photos Right, Plantar Foot] [N/A:N/A N/A] Wound Location: [8:Blister] [N/A:N/A] Wounding Event: [8:Diabetic Wound/Ulcer of the Lower] [N/A:N/A] Primary Etiology: [8:Extremity Cataracts, Glaucoma, Anemia, Chronic N/A] Comorbid History: [8:Obstructive Pulmonary Disease (COPD), Congestive Heart Failure, Coronary Artery Disease, Hypertension, Myocardial Infarction, Hepatitis C, Type II Diabetes, Gout, Osteoarthritis, Neuropathy 03/08/2022] [N/A:N/A] Date Acquired: [8:8] [N/A:N/A] Weeks of Treatment: [8:Open] [N/A:N/A] Wound Status: [8:No] [N/A:N/A] Wound Recurrence: [8:0.3x0.2x0.1] [N/A:N/A] Measurements L x W x D (cm) [8:0.047] [N/A:N/A] A (cm) : rea [8:0.005] [N/A:N/A] Volume (cm) : [8:95.70%] [N/A:N/A] % Reduction in A [8:rea:  98.50%] [N/A:N/A] % Reduction in Volume: [8:Grade 2] [N/A:N/A] Classification: [8:Medium] [N/A:N/A] Exudate A mount: [8:Serosanguineous] [N/A:N/A] Exudate Type: [8:red, brown] [N/A:N/A] Exudate Color: [8:Thickened] [N/A:N/A] Wound Margin: [8:Large (67-100%)] [N/A:N/A] Granulation A mount: [8:Red, Pale] [N/A:N/A] Granulation Quality: [8:Small (1-33%)] [N/A:N/A] Necrotic A mount: [8:Fat Layer (Subcutaneous Tissue): Yes N/A] Exposed Structures: [8:Fascia: No Tendon: No Muscle: No Joint: No Bone: No Medium (34-66%)] [N/A:N/A] Epithelialization: [8:Debridement - Excisional] [N/A:N/A] Debridement: Pre-procedure Verification/Time Out 11:56 [N/A:N/A] Taken: [8:Lidocaine 5% topical ointment] [N/A:N/A] Pain Control: [8:Subcutaneous, Slough] [N/A:N/A] Tissue Debrided: [8:Skin/Subcutaneous Tissue] [N/A:N/A] Level: [8:0.06] [N/A:N/A] Debridement A (sq cm): [8:rea Curette] [N/A:N/A] Instrument: [8:Minimum] [N/A:N/A] Bleeding: [8:Pressure] [N/A:N/A] Hemostasis A chieved: [8:0] [N/A:N/A] Procedural Pain: [8:0] [N/A:N/A] Post Procedural Pain: [8:Procedure was  tolerated well] [N/A:N/A] Debridement Treatment Response: [8:0.3x0.2x0.1] [N/A:N/A] Post Debridement Measurements L x W x D (cm) [8:0.005] [N/A:N/A] Post Debridement Volume: (cm) [8:Debridement] [N/A:N/A] Treatment Notes Wound #8 (Foot) Wound Laterality: Plantar, Right Cleanser Soap and Water Discharge Instruction: May shower and wash wound with dial antibacterial soap and water prior to dressing change. Wound Cleanser Discharge Instruction: Cleanse the wound with wound cleanser prior to applying a clean dressing using gauze sponges, not tissue or cotton balls. Peri-Wound Care Topical Primary Dressing KerraCel Ag Gelling Fiber Dressing, 2x2 in (silver alginate) Discharge Instruction: Apply silver alginate to wound bed as instructed Secondary Dressing Bordered Gauze, 4x4 in Discharge Instruction: Apply over primary dressing as directed. Optifoam Non-Adhesive Dressing, 4x4 in Discharge Instruction: Apply over primary dressing as directed. Secured With Compression Wrap Compression Stockings Environmental education officer) Signed: 05/03/2022 12:25:56 PM By: Fredirick Maudlin MD FACS Signed: 05/03/2022 4:46:37 PM By: Adline Peals Signed: 05/03/2022 4:46:37 PM By: Sabas Sous By: Fredirick Maudlin on 05/03/2022 12:25:55 -------------------------------------------------------------------------------- Multi-Disciplinary Care Plan Details Patient Name: Date of Service: Jacob [name]. 05/03/2022 11:30 A M Medical Record Number: [id number] Patient Account Number: [id number] Date of Birth/Sex: Treating RN: [birth year] (71 y.o. Jacob [name] Primary Care Yitta Gongaware: Alain Honey Other Clinician: Referring Antuane Eastridge: Treating Mandee Pluta/Extender: Neil Crouch in Treatment: 9 Active Inactive Wound/Skin Impairment Nursing Diagnoses: Impaired tissue integrity Goals: Patient/caregiver will verbalize understanding of skin care  regimen Date Initiated: 03/01/2022 Target Resolution Date: 05/20/2022 Goal Status: Active Interventions: Assess ulceration(s) every visit Treatment Activities: Skin care regimen initiated : 03/01/2022 Notes: Electronic Signature(s) Signed: 05/03/2022 4:46:37 PM By: Adline Peals Entered By: Adline Peals on 05/03/2022 11:54:26 -------------------------------------------------------------------------------- Pain Assessment Details Patient Name: Date of Service: ALDAN, CAMEY 05/03/2022 11:30 A M Medical Record Number: [id number] Patient Account Number: [id number] Date of Birth/Sex: Treating RN: Dec 02, 1950 (71 y.o. Jacob [name] Primary Care Michah Minton: Alain Honey Other Clinician: Referring Sally-Ann Cutbirth: Treating Lesle Faron/Extender: Neil Crouch in Treatment: 9 Active Problems Location of Pain Severity and Description of Pain Patient Has Paino No Site Locations Rate the pain. Rate the pain. Current Pain Level: 0 Pain Management and Medication Current Pain Management: Electronic Signature(s) Signed: 05/03/2022 4:46:37 PM By: Adline Peals Entered By: Adline Peals on 05/03/2022 11:52:56 -------------------------------------------------------------------------------- Patient/Caregiver Education Details Patient Name: Date of Service: Jacob [name] 7/11/2023andnbsp11:30 Roland Record Number: [id number] Patient Account Number: [id number] Date of Birth/Gender: Treating RN: [birth year] (71 y.o. Jacob [name] Primary Care Physician: Alain Honey Other Clinician: Referring Physician: Treating Physician/Extender: Neil Crouch in Treatment: 9 Education Assessment Education Provided To: Patient Education Topics Provided Offloading: Methods: Explain/Verbal Responses: Reinforcements needed, State content correctly Wound/Skin Impairment: Methods: Explain/Verbal Responses:  Reinforcements needed, State content  correctly Electronic Signature(s) Signed: 05/03/2022 4:46:37 PM By: Sabas Sous By: Adline Peals on 05/03/2022 11:54:51 -------------------------------------------------------------------------------- Wound Assessment Details Patient Name: Date of Service: EQUAN, COGBILL 05/03/2022 11:30 A M Medical Record Number: [id number] Patient Account Number: [id number] Date of Birth/Sex: Treating RN: 12-31-1950 (71 y.o. Jacob [name] Primary Care Caitlinn Klinker: Alain Honey Other Clinician: Referring Larysa Pall: Treating Kinslee Dalpe/Extender: Neil Crouch in Treatment: 9 Wound Status Wound Number: 8 Primary Diabetic Wound/Ulcer of the Lower Extremity Etiology: Wound Location: Right, Plantar Foot Wound Open Wounding Event: Blister Status: Date Acquired: 03/08/2022 Comorbid Cataracts, Glaucoma, Anemia, Chronic Obstructive Pulmonary Weeks Of Treatment: 8 History: Disease (COPD), Congestive Heart Failure, Coronary Artery Clustered Wound: No Disease, Hypertension, Myocardial Infarction, Hepatitis C, Type II Diabetes, Gout, Osteoarthritis, Neuropathy Wound Measurements Length: (cm) 0.3 Width: (cm) 0.2 Depth: (cm) 0.1 Area: (cm) 0.047 Volume: (cm) 0.005 % Reduction in Area: 95.7% % Reduction in Volume: 98.5% Epithelialization: Medium (34-66%) Tunneling: No Undermining: No Wound Description Classification: Grade 2 Wound Margin: Thickened Exudate Amount: Medium Exudate Type: Serosanguineous Exudate Color: red, brown Foul Odor After Cleansing: No Slough/Fibrino Yes Wound Bed Granulation Amount: Large (67-100%) Exposed Structure Granulation Quality: Red, Pale Fascia Exposed: No Necrotic Amount: Small (1-33%) Fat Layer (Subcutaneous Tissue) Exposed: Yes Necrotic Quality: Adherent Slough Tendon Exposed: No Muscle Exposed: No Joint Exposed: No Bone Exposed: No Treatment Notes Wound #8 (Foot)  Wound Laterality: Plantar, Right Cleanser Soap and Water Discharge Instruction: May shower and wash wound with dial antibacterial soap and water prior to dressing change. Wound Cleanser Discharge Instruction: Cleanse the wound with wound cleanser prior to applying a clean dressing using gauze sponges, not tissue or cotton balls. Peri-Wound Care Topical Primary Dressing KerraCel Ag Gelling Fiber Dressing, 2x2 in (silver alginate) Discharge Instruction: Apply silver alginate to wound bed as instructed Secondary Dressing Bordered Gauze, 4x4 in Discharge Instruction: Apply over primary dressing as directed. Optifoam Non-Adhesive Dressing, 4x4 in Discharge Instruction: Apply over primary dressing as directed. Secured With Compression Wrap Compression Stockings Add-Ons Electronic Signature(s) Signed: 05/03/2022 4:46:37 PM By: Adline Peals Entered By: Adline Peals on 05/03/2022 11:54:09 -------------------------------------------------------------------------------- Vitals Details Patient Name: Date of Service: Jacob Katos R. 05/03/2022 11:30 A M Medical Record Number: [id number] Patient Account Number: [id number] Date of Birth/Sex: Treating RN: [birth year] (71 y.o. Jacob [name] Primary Care Andersson Larrabee: Alain Honey Other Clinician: Referring Steen Bisig: Treating Lyra Alaimo/Extender: Neil Crouch in Treatment: 9 Vital Signs Time Taken: 11:51 Temperature (F): 97.8 Height (in): 74 Pulse (bpm): 88 Weight (lbs): 172 Respiratory Rate (breaths/min): 18 Body Mass Index (BMI): 22.1 Blood Pressure (mmHg): 100/62 Reference Range: 80 - 120 mg / dl Electronic Signature(s) Signed: 05/03/2022 4:46:37 PM By: Adline Peals Entered By: Adline Peals on 05/03/2022 11:51:38

## 2022-05-03 NOTE — Telephone Encounter (Signed)
Patient notified

## 2022-05-03 NOTE — Telephone Encounter (Signed)
Patient stated that he takes the medication every 6 hours and he wasn't given enough for a 30 day supply.

## 2022-05-03 NOTE — Progress Notes (Signed)
DARRIEN, BELTER ([id number]) Visit Report for 05/03/2022 Chief Complaint Document Details Patient Name: Date of Service: MARKICE, TORBERT 05/03/2022 11:30 A M Medical Record Number: [id number] Patient Account Number: [id number] Date of Birth/Sex: Treating RN: February 06, 1951 (71 y.o. Janyth Contes Primary Care Provider: Alain Honey Other Clinician: Referring Provider: Treating Provider/Extender: Neil Crouch in Treatment: 9 Information Obtained from: Patient Chief Complaint Patient seen for complaints of Non-Healing Wounds x 3 Electronic Signature(s) Signed: 05/03/2022 12:26:04 PM By: Fredirick Maudlin MD FACS Entered By: Fredirick Maudlin on 05/03/2022 12:26:04 -------------------------------------------------------------------------------- Debridement Details Patient Name: Date of Service: [name] 05/03/2022 11:30 A M Medical Record Number: [id number] Patient Account Number: [id number] Date of Birth/Sex: Treating RN: [birth year] (71 y.o. Janyth Contes Primary Care Provider: Alain Honey Other Clinician: Referring Provider: Treating Provider/Extender: Neil Crouch in Treatment: 9 Debridement Performed for Assessment: Wound #8 Right,Plantar Foot Performed By: Physician Fredirick Maudlin, MD Debridement Type: Debridement Severity of Tissue Pre Debridement: Fat layer exposed Level of Consciousness (Pre-procedure): Awake and Alert Pre-procedure Verification/Time Out Yes - 11:56 Taken: Start Time: 11:56 Pain Control: Lidocaine 5% topical ointment T Area Debrided (L x W): otal 0.3 (cm) x 0.2 (cm) = 0.06 (cm) Tissue and other material debrided: Non-Viable, Slough, Subcutaneous, Slough Level: Skin/Subcutaneous Tissue Debridement Description: Excisional Instrument: Curette Bleeding: Minimum Hemostasis Achieved: Pressure Procedural Pain: 0 Post Procedural Pain: 0 Response to Treatment: Procedure was  tolerated well Level of Consciousness (Post- Awake and Alert procedure): Post Debridement Measurements of Total Wound Length: (cm) 0.3 Width: (cm) 0.2 Depth: (cm) 0.1 Volume: (cm) 0.005 Character of Wound/Ulcer Post Debridement: Improved Severity of Tissue Post Debridement: Fat layer exposed Post Procedure Diagnosis Same as Pre-procedure Electronic Signature(s) Signed: 05/03/2022 4:27:02 PM By: Fredirick Maudlin MD FACS Signed: 05/03/2022 4:46:37 PM By: Adline Peals Entered By: Adline Peals on 05/03/2022 11:57:58 -------------------------------------------------------------------------------- HPI Details Patient Name: Date of Service: [name] 05/03/2022 11:30 A M Medical Record Number: [id number] Patient Account Number: [id number] Date of Birth/Sex: Treating RN: 26-Oct-1950 (71 y.o. Janyth Contes Primary Care Provider: Alain Honey Other Clinician: Referring Provider: Treating Provider/Extender: Neil Crouch in Treatment: 9 History of Present Illness Location: 2 ulcers noted to the left hallux Quality: the patient denies pain Duration: these were originally blisters towards the end of February and opened in early March Context: these wounds are secondary to pressure; he got new shoes in February and also started wearing a toe cover/sleeve Modifying Factors: pressure and friction are exacerbating factor HPI Description: 01/12/17- Mr. Och arrives for initial evaluation of 2 ulcers to his left hallux. He states that back in February he did receive a new pair of diabetic shoes and at that time he started wearing toe cover/sleeves and noticed development of a blister to the dorsal and plantar aspect. He has been seen at [facility] by both Dr. Amalia Hailey and Dr. Jacqualyn Posey. He last saw Dr.Wagoner on 3/8 at which time he was prescribed Silvadene and Keflex. He completed that dose of Keflex and states that he got a phone call to  extend the Keflex prior to this appointment. He has a follow-up with Dr. Amalia Hailey on 3/26. He has been wearing a surgical shoe, no significant offloading to the plantar/medial ulcer but the strap does not extend over the dorsal ulcer. Topically he has been applying the Silvadene daily and a dry dressing. He is a diabetic, on insulin, with a recent  A1c of 6.2% (11/10/16). He is a current smoker, 0.5 PPD. He has no known diagnosis of arterial insufficiency. 01/20/17- The patient arrives for follow up evaluation of his ulcers to the left hallux. He admits to continuing to smoke approximately 0.5ppd. He states that his diet has been somewhat uncontrolled with higher than normal glucose levels, this seems circumstantial as he had a death in the family as food and schedule has been disrupted. he admits to wearing the surgical show with offloading modifications while out of the home, but has been ambulating barefoot around the home. he saw Dr. Amalia Hailey yesterday for toenail trimming and callus shaving to the right plantar foot and investigation of his ulcers. 01/26/17- he is here for follow-up evaluation of his left hallux ulcers. He states he has been significantly more compliant in the use of his offloading surgical shoe, with the exception of "getting up for a glass of water" he states that he has worn it with any ambulation. He states he has decreased his smoking too, admitting to only a cigarette since yesterday. He states his blood sugar this morning was 147 and yesterday was 116. He is voicing no complaints or concerns 02/02/17- He is here for follow-up evaluation of his left hallux ulcers. The dorsal ulcer is crusted over/healed. He states he has minimized his ambulation over this past week, much less than the previous week. He states his glucose levels have been between 120-150 about half of the time, otherwise greater than 150. He cannot correlate any specific dietary changes. He does have an appointment with a  dietitian next month. He was advised to keep a log of his glucose levels and his diet for that appointment. 02/06/17; patient comes in today for first application of the total contact cast. Her intake nurse reported the wounds look better therefore I did not actually see these before application of the cast. He'll return on Thursday for reapplication in the standard fashion 02/10/27- he is here for follow-up evaluation of his left plantar hallux ulcer. He came in Monday for application of a total contact cast. He states that he was tolerating that Monday, but noticed on Tuesday that his foot began to slide in the cast. He also is complaining of the heaviness of the cast, as he ambulates as his primary form of transportation. He continues to smoke,. He has not contacted his cardiologist regarding Nicorette gum. He states his blood sugars have been slightly elevated, this morning being 206. He states that he will complete an antibiotic tonight. He states this was prescribed by Dr. Jacqualyn Posey. He states that he has refilled this prescription twice, without direction to continue taking this medication. 02/16/17; patient wound about the same as last week per her description. Rolled edges of callus scan and nonviable subcutaneous tissue. Using Prisma. Apparently used topical antibiotic instead of the Prisma over the course of this week. He has not a total contact cast candidate secondary to ambulation needs and unsteadiness on his feet per discussion with our intake nurse 03/02/17 patient requires debridement. He has a small wound on the right great toe which probes however does not approach bone. What I can see of the base of this wound looks reasonably healthy and I think this is largely in offloading issue with the second toe crossing over the first. He did not tolerate a total contact cast has balance issues 5/171/8. 0.2x0.2x0.2 less depth. Using prisma 03/16/17; no major change. Still a small probing wound  with copious amounts of surrounding  callus skin nonviable subcutaneous tissue all of which debrided. Still using Prisma. I suspect the dimensions post debridement are about the same as listed above 0.2 x 0.2 x 0.2 03/23/17; no major change using Prisma. Once again a large amount of callus nonviable subcutaneous tissue around the wound. I went over the offloading issues with the patient and the nurse. He did not tolerate a total contact cast he uses public transportation among other issues. 03/30/17; no major change using Prisma. Once again a large amount of callus and nonviable tissue around the wound requires debridement. Saw Dr. Amalia Hailey who pared calluses on the other foot. 04/06/17; the wound is much smaller there is callus around this although I think I can see the base of the wound and I elected not to do any debridement. 04/13/17 this is a wound I did not debridement last week left callus intact. He arrives today with the area much in the same predicament however this time I remove the callus to find a deep probing hole. This is disappointing this is not go to bone. We have been using Prisma. 04/20/17 plantar left first toe. Again although a lot of circumferential callus around the wound which requires debridement however this does not appear to be a probing area. Culture I did last week show coag-negative staph. I thought this was probably not something that was worth treating but In the interim he was seen by his primary care with parotitis. He is now on clindamycin and Cipro and the Staphylococcus we cultured is Cipro sensitive [lugdunensis} 04/27/17; plantar left first toe. Again a lot of circumferential callus around a small open area. Removing the callus also nonviable subcutaneous tissue. The wound looks better today. He has completed his antibiotics for the parotitis. I didn't think additional antibiotics were necessary in terms of the wound 05/11/17; plantar first toe. I thought this might be  closed this week however once again he has a deep probing small hole. Using pickups and a scalpel debridement of thick callus and subcutaneous tissue from around the wound reveals a depth of about 0.4 cm. We have not been able to offload the area. There is no obvious infection here. No evidence of ischemia 05/18/17; since his last visit he's been in hospital for oral surgery apparently to remove a retained surgical screw. He has swelling of his jaw and has some discomfort. He arrives today with the plantar left first toe in the same predicament. No major change. Each time he comes in here he has thick adherent callus 05/25/17; arrives today with roughly the same condition of the plantar left great toe. Perhaps somewhat better less callus. There are no options for offloading. He is on his feet a lot and I think this is largely a pressure issue X-ray I did last week showed no evidence of osteomyelitis. He has a toe separator as the great toe tends to ride under the second toe when he is walking. I'm hopeful that this will allow for a little less pressure on the plantar aspect of the left great toe 06/07/2017 -- he is working hard on giving up smoking and other than that has been wearing his diabetic shoes and is also trying to offload as much as possible. 06/15/17; patient arrived in clinic today with a difficult area on his left medial great toe closed over. There is still amount of callus on this however I don't believe there is any underlying open wound. He has custom shoes with inserts. I still think  he is going to need to offload this area going forward. He also follows with Dr. Amalia Hailey of podiatry for routine foot care READMISSION 11/01/16; patient arrives in clinic today with a five-day history of a new area on the previously problematic left great toe. He is wearing his diabetic shoes and he apparently has a new pair coming. The patient is a type II diabetic with neuropathy. He follows with Dr. Amalia Hailey  of podiatry for pre-ulcerative callus on his bilateral feet last seen on 08/24/17. He has developed a large blister on top of his left great toe this is since gone down somewhat. He is not been dressing this specifically. He does not have a history of significant PAD previous ABIs in this clinic were1.1 11/06/17; left great toe has epithelialized since last time. I think this was caused by a insoles an issue pushing the toe against the dorsal aspect of the shoe itself has new diabetic shoes coming hopefully will have more forefoot depth. READMISSION 03/01/2022 This is a now 71 year old man with a past medical history significant for type 2 diabetes mellitus with end-stage renal disease on hemodialysis as as well as congestive heart failure, ischemic cardiomyopathy, hypertension, and prior diabetic foot ulcers. He has had multiple issues with his hemodialysis access. He had a prior left radiocephalic fistula that failed. In October 2020, he had a left brachiobasilic fistula. He apparently underwent a procedure at an outside vascular office on November 09, 2021. Subsequent to that, he developed a wound on his left posterior forearm just distal to the elbow as well as wounds on 2 of his left digits. He was diagnosed with steal syndrome and recently underwent ligation of his fistula with placement of a hemodialysis catheter. He is here today for assistance in healing the wounds that developed related to steal syndrome. He says that he has been applying Neosporin to the sites and they are fairly sensitive to the touch. 03/08/2022: The wounds on his fingers are much smaller and have just a little bit of accumulated thin eschar. The wound on his left posterior forearm is also smaller but has accumulated both slough and eschar. He also has a thick callus on his right fifth metatarsal head plantar surface and unfortunately, it appears that there is an open wound underneath the callus. We have been using Iodosorb  with Hydrofera Blue to all of his open wounds. Pain is much improved from last week. 03/15/2022: The wounds on his fingers have healed. The wound on his left posterior forearm is smaller and has a nice layer of granulation tissue with just a bit of accumulated slough. He has built up additional callus around the right fifth metatarsal head plantar surface, but the underlying wound has a good surface with granulation tissue present. We have been using Iodosorb with Hydrofera Blue. He did not endorse any pain this week. 03/22/2022: The wound on his left posterior forearm continues to contract. There is good granulation tissue present and just a little bit of periwound eschar and overlying slough. The right fifth metatarsal head wound has once again accumulated some callus and there is slough within the wound. The intake nurse measured a area of undermining from 7-10 o'clock. 03/29/2022: The patient saw podiatry this week for nail trim and they also pared back a number of calluses. The wound on his left posterior forearm is substantially smaller with just some overlying thin eschar. The right fifth metatarsal head has a bit of callus accumulation as well as some slough in  the wound bed but is otherwise clean. It is smaller today, as well. 04/05/2022: The wound on his left posterior forearm is nearly closed with just a small amount of overlying eschar. The right fifth metatarsal head wound is smaller again this week. There is some periwound callus accumulation and some slough in the wound bed. He is going to get new dialysis access placed next week. 04/19/2022: The wound on his left posterior forearm is closed. The right fifth metatarsal head wound is about the same size with periwound callus and slough accumulation. He did have a new right forearm PTFE AV graft placed last week. His right forearm and hand are quite swollen, warm, and red today. He was apparently seen in the emergency room yesterday and they  did not think it was infected and attributed the swelling to normal postop findings. 04/28/2022: The wound on his foot has accumulated a lot of periwound callus, but the actual open surface is quite small. There is just a little bit of slough on the surface. 05/03/2022: His wound is even smaller today with less periwound callus accumulation. He continues to build up slough but the underlying tissue is healthy and viable-appearing. Electronic Signature(s) Signed: 05/03/2022 12:26:44 PM By: Fredirick Maudlin MD FACS Entered By: Fredirick Maudlin on 05/03/2022 12:26:44 -------------------------------------------------------------------------------- Physical Exam Details Patient Name: Date of Service: [name] 05/03/2022 11:30 A M Medical Record Number: [id number] Patient Account Number: [id number] Date of Birth/Sex: Treating RN: May 03, 1951 (71 y.o. Janyth Contes Primary Care Provider: Alain Honey Other Clinician: Referring Provider: Treating Provider/Extender: Neil Crouch in Treatment: 9 Constitutional . . . . No acute distress.[name] Respiratory Normal work of breathing on room air.. Notes 05/03/2022: His wound is even smaller today with less periwound callus accumulation. He continues to build up slough but the underlying tissue is healthy and viable-appearing. Electronic Signature(s) Signed: 05/03/2022 12:27:14 PM By: Fredirick Maudlin MD FACS Entered By: Fredirick Maudlin on 05/03/2022 12:27:14 -------------------------------------------------------------------------------- Physician Orders Details Patient Name: Date of Service: [name] 05/03/2022 11:30 A M Medical Record Number: [id number] Patient Account Number: [id number] Date of Birth/Sex: Treating RN: 12-Jun-1951 (71 y.o. Janyth Contes Primary Care Provider: Alain Honey Other Clinician: Referring Provider: Treating Provider/Extender: Neil Crouch in Treatment: 9 Verbal / Phone Orders: No Diagnosis Coding ICD-10 Coding Code Description [phone] Non-pressure chronic ulcer of other part of right foot with fat layer exposed E11.22 Type 2 diabetes mellitus with diabetic chronic kidney disease J44.9 Chronic obstructive pulmonary disease, unspecified I50.42 Chronic combined systolic (congestive) and diastolic (congestive) heart failure I10 Essential (primary) hypertension N25.81 Secondary hyperparathyroidism of renal origin N18.6 End stage renal disease B20 Human immunodeficiency virus [HIV] disease Follow-up Appointments ppointment in 1 week. - Dr. Celine Ahr - Room 1 - 7/18 at 1:15 PM Return A Bathing/ Shower/ Hygiene Other Bathing/Shower/Hygiene Orders/Instructions: - Change dressing after bathing Wound Treatment Wound #8 - Foot Wound Laterality: Plantar, Right Cleanser: Soap and Water Every Other Day/7 Days Discharge Instructions: May shower and wash wound with dial antibacterial soap and water prior to dressing change. Cleanser: Wound Cleanser (Generic) Every Other Day/7 Days Discharge Instructions: Cleanse the wound with wound cleanser prior to applying a clean dressing using gauze sponges, not tissue or cotton balls. Prim Dressing: KerraCel Ag Gelling Fiber Dressing, 2x2 in (silver alginate) Every Other Day/7 Days ary Discharge Instructions: Apply silver alginate to wound bed as instructed Secondary Dressing: Bordered Gauze, 4x4 in (Generic) Every Other Day/7  Days Discharge Instructions: Apply over primary dressing as directed. Secondary Dressing: Optifoam Non-Adhesive Dressing, 4x4 in Every Other Day/7 Days Discharge Instructions: Apply over primary dressing as directed. Electronic Signature(s) Signed: 05/03/2022 12:27:25 PM By: Fredirick Maudlin MD FACS Entered By: Fredirick Maudlin on 05/03/2022 12:27:25 -------------------------------------------------------------------------------- Problem List Details Patient  Name: Date of Service: [name] 05/03/2022 11:30 A M Medical Record Number: [id number] Patient Account Number: [id number] Date of Birth/Sex: Treating RN: October 02, 1951 (71 y.o. Janyth Contes Primary Care Provider: Alain Honey Other Clinician: Referring Provider: Treating Provider/Extender: Neil Crouch in Treatment: 9 Active Problems ICD-10 Encounter Code Description Active Date MDM Diagnosis [phone] Non-pressure chronic ulcer of other part of right foot with fat layer exposed 03/08/2022 No Yes E11.22 Type 2 diabetes mellitus with diabetic chronic kidney disease 03/01/2022 No Yes J44.9 Chronic obstructive pulmonary disease, unspecified 03/01/2022 No Yes I50.42 Chronic combined systolic (congestive) and diastolic (congestive) heart failure 03/01/2022 No Yes I10 Essential (primary) hypertension 03/01/2022 No Yes N25.81 Secondary hyperparathyroidism of renal origin 03/01/2022 No Yes N18.6 End stage renal disease 03/01/2022 No Yes B20 Human immunodeficiency virus [HIV] disease 03/01/2022 No Yes Inactive Problems Resolved Problems ICD-10 Code Description Active Date Resolved Date L98.499 Non-pressure chronic ulcer of skin of other sites with unspecified severity 03/01/2022 03/01/2022 Electronic Signature(s) Signed: 05/03/2022 12:25:49 PM By: Fredirick Maudlin MD FACS Entered By: Fredirick Maudlin on 05/03/2022 12:25:49 -------------------------------------------------------------------------------- Progress Note Details Patient Name: Date of Service: [name] 05/03/2022 11:30 A M Medical Record Number: [id number] Patient Account Number: [id number] Date of Birth/Sex: Treating RN: 09/30/1951 (71 y.o. Janyth Contes Primary Care Provider: Alain Honey Other Clinician: Referring Provider: Treating Provider/Extender: Neil Crouch in Treatment: 9 Subjective Chief Complaint Information obtained from Patient Patient  seen for complaints of Non-Healing Wounds x 3 History of Present Illness (HPI) The following HPI elements were documented for the patient's wound: Location: 2 ulcers noted to the left hallux Quality: the patient denies pain Duration: these were originally blisters towards the end of February and opened in early March Context: these wounds are secondary to pressure; he got new shoes in February and also started wearing a toe cover/sleeve Modifying Factors: pressure and friction are exacerbating factor 01/12/17- Mr. Whitehorn arrives for initial evaluation of 2 ulcers to his left hallux. He states that back in February he did receive a new pair of diabetic shoes and at that time he started wearing toe cover/sleeves and noticed development of a blister to the dorsal and plantar aspect. He has been seen at [facility] by both Dr. Amalia Hailey and Dr. Jacqualyn Posey. He last saw Dr.Wagoner on 3/8 at which time he was prescribed Silvadene and Keflex. He completed that dose of Keflex and states that he got a phone call to extend the Keflex prior to this appointment. He has a follow-up with Dr. Amalia Hailey on 3/26. He has been wearing a surgical shoe, no significant offloading to the plantar/medial ulcer but the strap does not extend over the dorsal ulcer. T opically he has been applying the Silvadene daily and a dry dressing. He is a diabetic, on insulin, with a recent A1c of 6.2% (11/10/16). He is a current smoker, 0.5 PPD. He has no known diagnosis of arterial insufficiency. 01/20/17- The patient arrives for follow up evaluation of his ulcers to the left hallux. He admits to continuing to smoke approximately 0.5ppd. He states that his diet has been somewhat uncontrolled with higher than normal glucose  levels, this seems circumstantial as he had a death in the family as food and schedule has been disrupted. he admits to wearing the surgical show with offloading modifications while out of the home, but has been ambulating  barefoot around the home. he saw Dr. Amalia Hailey yesterday for toenail trimming and callus shaving to the right plantar foot and investigation of his ulcers. 01/26/17- he is here for follow-up evaluation of his left hallux ulcers. He states he has been significantly more compliant in the use of his offloading surgical shoe, with the exception of "getting up for a glass of water" he states that he has worn it with any ambulation. He states he has decreased his smoking too, admitting to only a cigarette since yesterday. He states his blood sugar this morning was 147 and yesterday was 116. He is voicing no complaints or concerns 02/02/17- He is here for follow-up evaluation of his left hallux ulcers. The dorsal ulcer is crusted over/healed. He states he has minimized his ambulation over this past week, much less than the previous week. He states his glucose levels have been between 120-150 about half of the time, otherwise greater than 150. He cannot correlate any specific dietary changes. He does have an appointment with a dietitian next month. He was advised to keep a log of his glucose levels and his diet for that appointment. 02/06/17; patient comes in today for first application of the total contact cast. Her intake nurse reported the wounds look better therefore I did not actually see these before application of the cast. He'll return on Thursday for reapplication in the standard fashion 02/10/27- he is here for follow-up evaluation of his left plantar hallux ulcer. He came in Monday for application of a total contact cast. He states that he was tolerating that Monday, but noticed on Tuesday that his foot began to slide in the cast. He also is complaining of the heaviness of the cast, as he ambulates as his primary form of transportation. He continues to smoke,. He has not contacted his cardiologist regarding Nicorette gum. He states his blood sugars have been slightly elevated, this morning being 206. He  states that he will complete an antibiotic tonight. He states this was prescribed by Dr. Jacqualyn Posey. He states that he has refilled this prescription twice, without direction to continue taking this medication. 02/16/17; patient wound about the same as last week per her description. Rolled edges of callus scan and nonviable subcutaneous tissue. Using Prisma. Apparently used topical antibiotic instead of the Prisma over the course of this week. He has not a total contact cast candidate secondary to ambulation needs and unsteadiness on his feet per discussion with our intake nurse 03/02/17 patient requires debridement. He has a small wound on the right great toe which probes however does not approach bone. What I can see of the base of this wound looks reasonably healthy and I think this is largely in offloading issue with the second toe crossing over the first. He did not tolerate a total contact cast has balance issues 5/171/8. 0.2x0.2x0.2 less depth. Using prisma 03/16/17; no major change. Still a small probing wound with copious amounts of surrounding callus skin nonviable subcutaneous tissue all of which debrided. Still using Prisma. I suspect the dimensions post debridement are about the same as listed above 0.2 x 0.2 x 0.2 03/23/17; no major change using Prisma. Once again a large amount of callus nonviable subcutaneous tissue around the wound. I went over the offloading issues with  the patient and the nurse. He did not tolerate a total contact cast he uses public transportation among other issues. 03/30/17; no major change using Prisma. Once again a large amount of callus and nonviable tissue around the wound requires debridement. Saw Dr. Amalia Hailey who pared calluses on the other foot. 04/06/17; the wound is much smaller there is callus around this although I think I can see the base of the wound and I elected not to do any debridement. 04/13/17 this is a wound I did not debridement last week left callus  intact. He arrives today with the area much in the same predicament however this time I remove the callus to find a deep probing hole. This is disappointing this is not go to bone. We have been using Prisma. 04/20/17 plantar left first toe. Again although a lot of circumferential callus around the wound which requires debridement however this does not appear to be a probing area. Culture I did last week show coag-negative staph. I thought this was probably not something that was worth treating but In the interim he was seen by his primary care with parotitis. He is now on clindamycin and Cipro and the Staphylococcus we cultured is Cipro sensitive [lugdunensis} 04/27/17; plantar left first toe. Again a lot of circumferential callus around a small open area. Removing the callus also nonviable subcutaneous tissue. The wound looks better today. He has completed his antibiotics for the parotitis. I didn't think additional antibiotics were necessary in terms of the wound 05/11/17; plantar first toe. I thought this might be closed this week however once again he has a deep probing small hole. Using pickups and a scalpel debridement of thick callus and subcutaneous tissue from around the wound reveals a depth of about 0.4 cm. We have not been able to offload the area. There is no obvious infection here. No evidence of ischemia 05/18/17; since his last visit he's been in hospital for oral surgery apparently to remove a retained surgical screw. He has swelling of his jaw and has some discomfort. He arrives today with the plantar left first toe in the same predicament. No major change. Each time he comes in here he has thick adherent callus 05/25/17; arrives today with roughly the same condition of the plantar left great toe. Perhaps somewhat better less callus. There are no options for offloading. He is on his feet a lot and I think this is largely a pressure issue X-ray I did last week showed no evidence of  osteomyelitis. He has a toe separator as the great toe tends to ride under the second toe when he is walking. I'm hopeful that this will allow for a little less pressure on the plantar aspect of the left great toe 06/07/2017 -- he is working hard on giving up smoking and other than that has been wearing his diabetic shoes and is also trying to offload as much as possible. 06/15/17; patient arrived in clinic today with a difficult area on his left medial great toe closed over. There is still amount of callus on this however I don't believe there is any underlying open wound. He has custom shoes with inserts. I still think he is going to need to offload this area going forward. He also follows with Dr. Amalia Hailey of podiatry for routine foot care READMISSION 11/01/16; patient arrives in clinic today with a five-day history of a new area on the previously problematic left great toe. He is wearing his diabetic shoes and he apparently has  a new pair coming. The patient is a type II diabetic with neuropathy. He follows with Dr. Amalia Hailey of podiatry for pre-ulcerative callus on his bilateral feet last seen on 08/24/17. He has developed a large blister on top of his left great toe this is since gone down somewhat. He is not been dressing this specifically. He does not have a history of significant PAD previous ABIs in this clinic were1.1 11/06/17; left great toe has epithelialized since last time. I think this was caused by a insoles an issue pushing the toe against the dorsal aspect of the shoe itself has new diabetic shoes coming hopefully will have more forefoot depth. READMISSION 03/01/2022 This is a now 71 year old man with a past medical history significant for type 2 diabetes mellitus with end-stage renal disease on hemodialysis as as well as congestive heart failure, ischemic cardiomyopathy, hypertension, and prior diabetic foot ulcers. He has had multiple issues with his hemodialysis access. He had a prior left  radiocephalic fistula that failed. In October 2020, he had a left brachiobasilic fistula. He apparently underwent a procedure at an outside vascular office on November 09, 2021. Subsequent to that, he developed a wound on his left posterior forearm just distal to the elbow as well as wounds on 2 of his left digits. He was diagnosed with steal syndrome and recently underwent ligation of his fistula with placement of a hemodialysis catheter. He is here today for assistance in healing the wounds that developed related to steal syndrome. He says that he has been applying Neosporin to the sites and they are fairly sensitive to the touch. 03/08/2022: The wounds on his fingers are much smaller and have just a little bit of accumulated thin eschar. The wound on his left posterior forearm is also smaller but has accumulated both slough and eschar. He also has a thick callus on his right fifth metatarsal head plantar surface and unfortunately, it appears that there is an open wound underneath the callus. We have been using Iodosorb with Hydrofera Blue to all of his open wounds. Pain is much improved from last week. 03/15/2022: The wounds on his fingers have healed. The wound on his left posterior forearm is smaller and has a nice layer of granulation tissue with just a bit of accumulated slough. He has built up additional callus around the right fifth metatarsal head plantar surface, but the underlying wound has a good surface with granulation tissue present. We have been using Iodosorb with Hydrofera Blue. He did not endorse any pain this week. 03/22/2022: The wound on his left posterior forearm continues to contract. There is good granulation tissue present and just a little bit of periwound eschar and overlying slough. The right fifth metatarsal head wound has once again accumulated some callus and there is slough within the wound. The intake nurse measured a area of undermining from 7-10 o'clock. 03/29/2022: The  patient saw podiatry this week for nail trim and they also pared back a number of calluses. The wound on his left posterior forearm is substantially smaller with just some overlying thin eschar. The right fifth metatarsal head has a bit of callus accumulation as well as some slough in the wound bed but is otherwise clean. It is smaller today, as well. 04/05/2022: The wound on his left posterior forearm is nearly closed with just a small amount of overlying eschar. The right fifth metatarsal head wound is smaller again this week. There is some periwound callus accumulation and some slough in the wound  bed. He is going to get new dialysis access placed next week. 04/19/2022: The wound on his left posterior forearm is closed. The right fifth metatarsal head wound is about the same size with periwound callus and slough accumulation. He did have a new right forearm PTFE AV graft placed last week. His right forearm and hand are quite swollen, warm, and red today. He was apparently seen in the emergency room yesterday and they did not think it was infected and attributed the swelling to normal postop findings. 04/28/2022: The wound on his foot has accumulated a lot of periwound callus, but the actual open surface is quite small. There is just a little bit of slough on the surface. 05/03/2022: His wound is even smaller today with less periwound callus accumulation. He continues to build up slough but the underlying tissue is healthy and viable-appearing. Patient History Information obtained from Patient. Family History Diabetes - Siblings,Father, Heart Disease - Father,Siblings, Hypertension - Father,Siblings, No family history of Cancer, Hereditary Spherocytosis, Kidney Disease, Lung Disease, Seizures, Stroke, Thyroid Problems, Tuberculosis. Social History Current every day smoker - 1/2 ppd / 31yr, Marital Status - Single, Alcohol Use - Moderate - 6 standard drinks per week, Drug Use - Prior History  - Polysubstance Abuse; clean for 9 years, Caffeine Use - Rarely - tea. Medical History Eyes Patient has history of Cataracts - mild, Glaucoma Denies history of Optic Neuritis Ear/Nose/Mouth/Throat Denies history of Chronic sinus problems/congestion, Middle ear problems Hematologic/Lymphatic Patient has history of Anemia Denies history of Hemophilia, Human Immunodeficiency Virus, Lymphedema, Sickle Cell Disease Respiratory Patient has history of Chronic Obstructive Pulmonary Disease (COPD) Denies history of Aspiration, Asthma, Pneumothorax, Sleep Apnea, Tuberculosis Cardiovascular Patient has history of Congestive Heart Failure, Coronary Artery Disease, Hypertension, Myocardial Infarction - 2004 Denies history of Angina, Arrhythmia, Deep Vein Thrombosis, Hypotension, Peripheral Arterial Disease, Peripheral Venous Disease, Phlebitis, Vasculitis Gastrointestinal Patient has history of Hepatitis C - 1968 Denies history of Cirrhosis , Colitis, Crohnoos, Hepatitis A, Hepatitis B Endocrine Patient has history of Type II Diabetes Denies history of Type I Diabetes Genitourinary Denies history of End Stage Renal Disease Immunological Denies history of Lupus Erythematosus, Raynaudoos, Scleroderma Integumentary (Skin) Denies history of History of Burn Musculoskeletal Patient has history of Gout, Osteoarthritis Denies history of Rheumatoid Arthritis, Osteomyelitis Neurologic Patient has history of Neuropathy Denies history of Dementia, Quadriplegia, Paraplegia, Seizure Disorder Oncologic Denies history of Received Chemotherapy, Received Radiation Psychiatric Denies history of Anorexia/bulimia, Confinement Anxiety Hospitalization/Surgery History - Colonoscopy. - CABG. - Laproscopic Cholecystectomy. - Left Hip Fx. - Left Jaw Fx. - Lumbar Laminectomy. Medical A Surgical History Notes nd Constitutional Symptoms (General Health) Insomnia Syncope Alcohol Dependence  HIV Hematologic/Lymphatic Hyperlipidemia Hyperglycemia Cardiovascular CABG, ischemic cardiomyopathy Gastrointestinal GERD Genitourinary Syphilis CKD,Nocturia Immunological HIV Musculoskeletal Hip Fx BiLateral Bunions Unspecified Arthritis Neurologic chronic back pain Objective Constitutional No acute distress.. Vitals Time Taken: 11:51 AM, Height: 74 in, Weight: 172 lbs, BMI: 22.1, Temperature: 97.8 F, Pulse: 88 bpm, Respiratory Rate: 18 breaths/min, Blood Pressure: 100/62 mmHg. Respiratory Normal work of breathing on room air.. General Notes: 05/03/2022: His wound is even smaller today with less periwound callus accumulation. He continues to build up slough but the underlying tissue is healthy and viable-appearing. Integumentary (Hair, Skin) Wound #8 status is Open. Original cause of wound was Blister. The date acquired was: 03/08/2022. The wound has been in treatment 8 weeks. The wound is located on the RDel Aire The wound measures 0.3cm length x 0.2cm width x 0.1cm depth; 0.047cm^2 area and 0.005cm^3 volume.  There is Fat Layer (Subcutaneous Tissue) exposed. There is no tunneling or undermining noted. There is a medium amount of serosanguineous drainage noted. The wound margin is thickened. There is large (67-100%) red, pale granulation within the wound bed. There is a small (1-33%) amount of necrotic tissue within the wound bed including Adherent Slough. 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 Chronic obstructive pulmonary disease, unspecified Chronic combined systolic (congestive) and diastolic (congestive) heart failure Essential (primary) hypertension Secondary hyperparathyroidism of renal origin End stage renal disease Human immunodeficiency virus [HIV] disease Procedures Wound #8 Pre-procedure diagnosis of Wound #8 is a  Diabetic Wound/Ulcer of the Lower Extremity located on the Right,Plantar Foot .Severity of Tissue Pre Debridement is: Fat layer exposed. There was a Excisional Skin/Subcutaneous Tissue Debridement with a total area of 0.06 sq cm performed by Fredirick Maudlin, MD. With the following instrument(s): Curette to remove Non-Viable tissue/material. Material removed includes Subcutaneous Tissue and Slough and after achieving pain control using Lidocaine 5% topical ointment. No specimens were taken. A time out was conducted at 11:56, prior to the start of the procedure. A Minimum amount of bleeding was controlled with Pressure. The procedure was tolerated well with a pain level of 0 throughout and a pain level of 0 following the procedure. Post Debridement Measurements: 0.3cm length x 0.2cm width x 0.1cm depth; 0.005cm^3 volume. Character of Wound/Ulcer Post Debridement is improved. Severity of Tissue Post Debridement is: Fat layer exposed. Post procedure Diagnosis Wound #8: Same as Pre-Procedure Plan Follow-up Appointments: Return Appointment in 1 week. - Dr. Celine Ahr - Room 1 - 7/18 at 1:15 PM Bathing/ Shower/ Hygiene: Other Bathing/Shower/Hygiene Orders/Instructions: - Change dressing after bathing WOUND #8: - Foot Wound Laterality: Plantar, Right Cleanser: Soap and Water Every Other Day/7 Days Discharge Instructions: May shower and wash wound with dial antibacterial soap and water prior to dressing change. Cleanser: Wound Cleanser (Generic) Every Other Day/7 Days Discharge Instructions: Cleanse the wound with wound cleanser prior to applying a clean dressing using gauze sponges, not tissue or cotton balls. Prim Dressing: KerraCel Ag Gelling Fiber Dressing, 2x2 in (silver alginate) Every Other Day/7 Days ary Discharge Instructions: Apply silver alginate to wound bed as instructed Secondary Dressing: Bordered Gauze, 4x4 in (Generic) Every Other Day/7 Days Discharge Instructions: Apply over primary  dressing as directed. Secondary Dressing: Optifoam Non-Adhesive Dressing, 4x4 in Every Other Day/7 Days Discharge Instructions: Apply over primary dressing as directed. 05/03/2022: His wound is even smaller today with less periwound callus accumulation. He continues to build up slough but the underlying tissue is healthy and viable-appearing. I used a curette to debride the slough and a little bit of periwound eschar from his wound. Given the small size, I think will be easier for him to apply silver alginate, rather than Hydrofera Blue so I will change that dressing. Continue foam donut and border dressing. Follow-up in 1 week. Electronic Signature(s) Signed: 05/03/2022 12:28:07 PM By: Fredirick Maudlin MD FACS Entered By: Fredirick Maudlin on 05/03/2022 12:28:06 -------------------------------------------------------------------------------- HxROS Details Patient Name: Date of Service: [name] 05/03/2022 11:30 A M Medical Record Number: [id number] Patient Account Number: [id number] Date of Birth/Sex: Treating RN: 09/21/1951 (71 y.o. Janyth Contes Primary Care Provider: Alain Honey Other Clinician: Referring Provider: Treating Provider/Extender: Neil Crouch in Treatment: 9 Information Obtained From Patient Constitutional Symptoms (General Health) Medical History: Past Medical History Notes: Insomnia  Syncope Alcohol Dependence HIV Eyes Medical History: Positive for: Cataracts - mild; Glaucoma Negative for: Optic Neuritis Ear/Nose/Mouth/Throat Medical History: Negative for: Chronic sinus problems/congestion; Middle ear problems Hematologic/Lymphatic Medical History: Positive for: Anemia Negative for: Hemophilia; Human Immunodeficiency Virus; Lymphedema; Sickle Cell Disease Past Medical History Notes: Hyperlipidemia Hyperglycemia Respiratory Medical History: Positive for: Chronic Obstructive Pulmonary Disease (COPD) Negative  for: Aspiration; Asthma; Pneumothorax; Sleep Apnea; Tuberculosis Cardiovascular Medical History: Positive for: Congestive Heart Failure; Coronary Artery Disease; Hypertension; Myocardial Infarction - 2004 Negative for: Angina; Arrhythmia; Deep Vein Thrombosis; Hypotension; Peripheral Arterial Disease; Peripheral Venous Disease; Phlebitis; Vasculitis Past Medical History Notes: CABG, ischemic cardiomyopathy Gastrointestinal Medical History: Positive for: Hepatitis C - 1968 Negative for: Cirrhosis ; Colitis; Crohns; Hepatitis A; Hepatitis B Past Medical History Notes: GERD Endocrine Medical History: Positive for: Type II Diabetes Negative for: Type I Diabetes Time with diabetes: 13 yrs Treated with: Insulin Blood sugar tested every day: Yes Tested : 2 times per day Blood sugar testing results: Breakfast: 100; Dinner: 95-126 Genitourinary Medical History: Negative for: End Stage Renal Disease Past Medical History Notes: Syphilis CKD,Nocturia Immunological Medical History: Negative for: Lupus Erythematosus; Raynauds; Scleroderma Past Medical History Notes: HIV Integumentary (Skin) Medical History: Negative for: History of Burn Musculoskeletal Medical History: Positive for: Gout; Osteoarthritis Negative for: Rheumatoid Arthritis; Osteomyelitis Past Medical History Notes: Hip Fx BiLateral Bunions Unspecified Arthritis Neurologic Medical History: Positive for: Neuropathy Negative for: Dementia; Quadriplegia; Paraplegia; Seizure Disorder Past Medical History Notes: chronic back pain Oncologic Medical History: Negative for: Received Chemotherapy; Received Radiation Psychiatric Medical History: Negative for: Anorexia/bulimia; Confinement Anxiety HBO Extended History Items Eyes: Eyes: Cataracts Glaucoma Immunizations Pneumococcal Vaccine: Received Pneumococcal Vaccination: Yes Received Pneumococcal Vaccination On or After 60th Birthday: Yes Implantable Devices No  devices added Hospitalization / Surgery History Type of Hospitalization/Surgery Colonoscopy CABG Laproscopic Cholecystectomy Left Hip Fx Left Jaw Fx Lumbar Laminectomy Family and Social History Cancer: No; Diabetes: Yes - Siblings,Father; Heart Disease: Yes - Father,Siblings; Hereditary Spherocytosis: No; Hypertension: Yes - Father,Siblings; Kidney Disease: No; Lung Disease: No; Seizures: No; Stroke: No; Thyroid Problems: No; Tuberculosis: No; Current every day smoker - 1/2 ppd / 16yr; Marital Status - Single; Alcohol Use: Moderate - 6 standard drinks per week; Drug Use: Prior History - Polysubstance Abuse; clean for 9 years; Caffeine Use: Rarely - tea; Financial Concerns: No; Food, Clothing or Shelter Needs: No; Support System Lacking: No; Transportation Concerns: No Electronic Signature(s) Signed: 05/03/2022 4:27:02 PM By: CFredirick MaudlinMD FACS Signed: 05/03/2022 4:46:37 PM By: HSabas SousBy: CFredirick Maudlinon 05/03/2022 12:26:50 -------------------------------------------------------------------------------- SuperBill Details Patient Name: Date of Service: NRaechel Ache 05/03/2022 Medical Record Number: [id number] Account Number: [id number] of Birth/Sex: Treating RN: 4September 28, 1952(71y.o. MJanyth ContesPrimary Care Provider: MAlain HoneyOther Clinician: Referring Provider: Treating Provider/Extender: CNeil Crouchin Treatment: 9 Diagnosis Coding ICD-10 Codes Code Description [id number]-pressure chronic ulcer of other part of right foot with fat layer exposed E11.22 Type 2 diabetes mellitus with diabetic chronic kidney disease J44.9 Chronic obstructive pulmonary disease, unspecified I50.42 Chronic combined systolic (congestive) and diastolic (congestive) heart failure I10 Essential (primary) hypertension N25.81 Secondary hyperparathyroidism of renal origin N18.6 End stage renal disease B20 Human  immunodeficiency virus [HIV] disease Facility Procedures CPT4 Code: [id number]: 188416- DEB SUBQ TISSUE 20 SQ CM/< ICD-10 Diagnosis Description L97.512 Non-pressure chronic ulcer of other part of right foot with fat layer exposed Modifier: Quantity: 1 Physician Procedures : CPT4 Code Description Modifier [id number]  WC PHYS LEVEL 3 - EST PT 25 ICD-10 Diagnosis Description L97.512 Non-pressure chronic ulcer of other part of right foot with fat layer exposed E11.22 Type 2 diabetes mellitus with diabetic chronic kidney  disease I50.42 Chronic combined systolic (congestive) and diastolic (congestive) heart failure B20 Human immunodeficiency virus [HIV] disease Quantity: 1 : [id number] - WC PHYS SUBQ TISS 20 SQ CM ICD-10 Diagnosis Description L97.512 Non-pressure chronic ulcer of other part of right foot with fat layer exposed Quantity: 1 Electronic Signature(s) Signed: 05/03/2022 12:28:28 PM By: Fredirick Maudlin MD FACS Entered By: Fredirick Maudlin on 05/03/2022 36:01:65

## 2022-05-03 NOTE — Telephone Encounter (Signed)
Patient called again this morning requesting his refill for the Oxycodone. Stated that he does not understand why it is so hard to get his refill when it is prescribed. Stated that he takes it as prescribed.   Please Advise.

## 2022-05-03 NOTE — Telephone Encounter (Signed)
May need to discuss quantity of pills per month with PCP Dr. Sabra Heck

## 2022-05-04 DIAGNOSIS — Z992 Dependence on renal dialysis: Secondary | ICD-10-CM | POA: Diagnosis not present

## 2022-05-04 DIAGNOSIS — T8249XA Other complication of vascular dialysis catheter, initial encounter: Secondary | ICD-10-CM | POA: Diagnosis not present

## 2022-05-04 DIAGNOSIS — N186 End stage renal disease: Secondary | ICD-10-CM | POA: Diagnosis not present

## 2022-05-04 DIAGNOSIS — N2581 Secondary hyperparathyroidism of renal origin: Secondary | ICD-10-CM | POA: Diagnosis not present

## 2022-05-04 DIAGNOSIS — E8779 Other fluid overload: Secondary | ICD-10-CM | POA: Diagnosis not present

## 2022-05-04 DIAGNOSIS — E1122 Type 2 diabetes mellitus with diabetic chronic kidney disease: Secondary | ICD-10-CM | POA: Diagnosis not present

## 2022-05-04 DIAGNOSIS — D631 Anemia in chronic kidney disease: Secondary | ICD-10-CM | POA: Diagnosis not present

## 2022-05-04 NOTE — Progress Notes (Signed)
Reason for Visit:                   Fitting of Diabetic Shoes & Insoles Patient / Caregiver Report:     Patient is satisfied with fit and function of shoes and insoles.  ACTIONS PERFORMED Shoes and insoles were verified for structural integrity and safety. Patient wore shoes and insoles in office. Skin was inspected and free of areas of concern after wearing shoes and inserts. Shoes and inserts fit properly.  Patient / Caregiver provided with verbal instruction and demonstration regarding wear, care, proper fit, function, purpose, cleaning, and use of shoes and insoles and in all related precautions and risks and benefits regarding shoes and insoles. Patient / Caregiver was instructed to wear properly fitting socks with shoes at all times. Patient was also provided with verbal instruction regarding how to report any failures or malfunctions of shoes or inserts, and necessary follow up care. Patient / Caregiver was also instructed to contact physician regarding change in status that may affect function of shoes and inserts.    Patient / Caregiver verbalized undersatnding of instruction provided. Patient / Caregiver demonstrated independence with proper donning and doffing of shoes and inserts.   PLAN Patient is to follow up in one week or as necessary (PRN). All questions were answered and concerns addressed.  

## 2022-05-06 DIAGNOSIS — Z992 Dependence on renal dialysis: Secondary | ICD-10-CM | POA: Diagnosis not present

## 2022-05-06 DIAGNOSIS — T8249XA Other complication of vascular dialysis catheter, initial encounter: Secondary | ICD-10-CM | POA: Diagnosis not present

## 2022-05-06 DIAGNOSIS — N2581 Secondary hyperparathyroidism of renal origin: Secondary | ICD-10-CM | POA: Diagnosis not present

## 2022-05-06 DIAGNOSIS — D631 Anemia in chronic kidney disease: Secondary | ICD-10-CM | POA: Diagnosis not present

## 2022-05-06 DIAGNOSIS — N186 End stage renal disease: Secondary | ICD-10-CM | POA: Diagnosis not present

## 2022-05-06 DIAGNOSIS — E8779 Other fluid overload: Secondary | ICD-10-CM | POA: Diagnosis not present

## 2022-05-06 DIAGNOSIS — E1122 Type 2 diabetes mellitus with diabetic chronic kidney disease: Secondary | ICD-10-CM | POA: Diagnosis not present

## 2022-05-07 ENCOUNTER — Other Ambulatory Visit: Payer: Self-pay

## 2022-05-07 ENCOUNTER — Emergency Department (HOSPITAL_COMMUNITY)
Admission: EM | Admit: 2022-05-07 | Discharge: 2022-05-07 | Disposition: A | Discharge status: Home / Self Care | Payer: Medicare Other | Attending: Emergency Medicine | Admitting: Emergency Medicine

## 2022-05-07 ENCOUNTER — Encounter (HOSPITAL_COMMUNITY): Payer: Self-pay | Admitting: Emergency Medicine

## 2022-05-07 ENCOUNTER — Emergency Department (HOSPITAL_COMMUNITY): Payer: Medicare Other

## 2022-05-07 DIAGNOSIS — R531 Weakness: Secondary | ICD-10-CM | POA: Diagnosis not present

## 2022-05-07 DIAGNOSIS — D649 Anemia, unspecified: Secondary | ICD-10-CM

## 2022-05-07 DIAGNOSIS — Z951 Presence of aortocoronary bypass graft: Secondary | ICD-10-CM | POA: Diagnosis not present

## 2022-05-07 DIAGNOSIS — Y732 Prosthetic and other implants, materials and accessory gastroenterology and urology devices associated with adverse incidents: Secondary | ICD-10-CM | POA: Diagnosis not present

## 2022-05-07 DIAGNOSIS — T829XXA Unspecified complication of cardiac and vascular prosthetic device, implant and graft, initial encounter: Secondary | ICD-10-CM

## 2022-05-07 DIAGNOSIS — I700 Atherosclerosis of aorta: Secondary | ICD-10-CM | POA: Diagnosis not present

## 2022-05-07 DIAGNOSIS — R509 Fever, unspecified: Secondary | ICD-10-CM | POA: Diagnosis not present

## 2022-05-07 DIAGNOSIS — T82594A Other mechanical complication of infusion catheter, initial encounter: Secondary | ICD-10-CM | POA: Insufficient documentation

## 2022-05-07 DIAGNOSIS — Z452 Encounter for adjustment and management of vascular access device: Secondary | ICD-10-CM | POA: Diagnosis not present

## 2022-05-07 DIAGNOSIS — T80219A Unspecified infection due to central venous catheter, initial encounter: Secondary | ICD-10-CM | POA: Diagnosis not present

## 2022-05-07 DIAGNOSIS — Z743 Need for continuous supervision: Secondary | ICD-10-CM | POA: Diagnosis not present

## 2022-05-07 LAB — CBC
HCT: 34.8 % — ABNORMAL LOW (ref 39.0–52.0)
Hemoglobin: 11.1 g/dL — ABNORMAL LOW (ref 13.0–17.0)
MCH: 34.6 pg — ABNORMAL HIGH (ref 26.0–34.0)
MCHC: 31.9 g/dL (ref 30.0–36.0)
MCV: 108.4 fL — ABNORMAL HIGH (ref 80.0–100.0)
Platelets: 131 10*3/uL — ABNORMAL LOW (ref 150–400)
RBC: 3.21 MIL/uL — ABNORMAL LOW (ref 4.22–5.81)
RDW: 14.9 % (ref 11.5–15.5)
WBC: 4.5 10*3/uL (ref 4.0–10.5)
nRBC: 0 % (ref 0.0–0.2)

## 2022-05-07 LAB — BASIC METABOLIC PANEL
Anion gap: 17 — ABNORMAL HIGH (ref 5–15)
BUN: 41 mg/dL — ABNORMAL HIGH (ref 8–23)
CO2: 25 mmol/L (ref 22–32)
Calcium: 9.4 mg/dL (ref 8.9–10.3)
Chloride: 96 mmol/L — ABNORMAL LOW (ref 98–111)
Creatinine, Ser: 9.16 mg/dL — ABNORMAL HIGH (ref 0.61–1.24)
GFR, Estimated: 6 mL/min — ABNORMAL LOW (ref >60)
Glucose, Bld: 132 mg/dL — ABNORMAL HIGH (ref 70–99)
Potassium: 5.1 mmol/L (ref 3.5–5.1)
Sodium: 138 mmol/L (ref 135–145)

## 2022-05-07 LAB — TYPE AND SCREEN
ABO/RH(D): A POS
Antibody Screen: NEGATIVE

## 2022-05-07 NOTE — Discharge Instructions (Signed)
Follow up for dialysis as scheduled

## 2022-05-07 NOTE — ED Provider Notes (Signed)
[facility] EMERGENCY DEPARTMENT Provider Note   CSN: [id number] Arrival date & time: 05/07/22  6644     History  Chief Complaint  Patient presents with   Dialysis Catheter Problem     MONTOYA BRANDEL is a 71 y.o. male.  Patient presents to the emergency department for evaluation of his dialysis catheter..  Patient reports catheter pulled out some when he rolled on his side.  Patient reports he was able to have dialysis from the catheter however he was concerned that he needed to get it checked.  Patient states he received a call and was told that his hemoglobin was 6 and that he needed to come to the emergency department for evaluation of possible blood loss and anemia. patient denies any black or tarry looking stools.  Patient denies any weakness  The history is provided by the patient. No language interpreter was used.       Home Medications Prior to Admission medications   Medication Sig Start Date End Date Taking? Authorizing Provider  acetaminophen (TYLENOL) 650 MG CR tablet Take 1,300 mg by mouth 3 (three) times daily.    [provider]  allopurinol (ZYLOPRIM) 100 MG tablet TAKE 1 TABLET(100 MG) BY MOUTH DAILY 08/05/21   [name], MD  [facility] ELLIPTA 62.5-25 MCG/INH AEPB INHALE 1 PUFF BY MOUTH EVERY DAY 12/21/20   [name], NP  atorvastatin (LIPITOR) 10 MG tablet TAKE 1 TABLET(10 MG) BY MOUTH DAILY 02/14/22   [name], MD  B-D UF III MINI PEN NEEDLES 31G X 5 MM MISC USE FOUR TIMES DAILY 05/20/21   [name], NP  BIKTARVY 50-200-25 MG TABS tablet Take 1 tablet by mouth daily. 03/10/22   [name], MD  Calcium Acetate 667 MG TABS Take 667-2,001 mg by mouth See admin instructions. Take 2001 mg by mouth three times a day with meals and 667 mg with each snack 01/20/21   [provider]  carvedilol (COREG) 3.125 MG tablet TAKE 1 TABLET(3.125 MG) BY MOUTH TWICE DAILY 04/14/21   [name], MD  colchicine 0.6  MG tablet Take one tablet by mouth once daily as needed for gout. 03/28/22   [name], NP  diclofenac Sodium (VOLTAREN) 1 % GEL APPLY 2 GRAMS TOPICALLY TO THE AFFECTED AREA THREE TIMES DAILY AS NEEDED FOR PAIN 04/29/22   [name], MD  docusate sodium (COLACE) 100 MG capsule Take 100 mg by mouth daily as needed for mild constipation.    [provider]  folic acid (FOLVITE) 1 MG tablet TAKE 1 TABLET(1 MG) BY MOUTH DAILY 08/05/21   [name], MD  gabapentin (NEURONTIN) 300 MG capsule TAKE 1 CAPSULE BY MOUTH DAILY. DOSE CAHNGE DUE TO RENAL FUNCTION 08/05/21   [name], MD  glucose blood [facility] ULTRA) test strip Check blood sugar three times daily E11.40 11/10/21   [name], MD  insulin lispro (HUMALOG) 100 UNIT/ML KwikPen INJECT 13 UNITS UNDER THE SKIN THREE TIMES DAILY AS NEEDED FOR HIGH BLOOD SUGAR(ABOVE 150) 05/08/21   Regalado, Belkys A, MD  isosorbide mononitrate (IMDUR) 30 MG 24 hr tablet TAKE 1 TABLET BY MOUTH EVERY DAY 08/05/21   [name], MD  Lancets [facility] DELICA PLUS [id number]) MISC 1 Device by Other route 3 (three) times daily. E11.42 07/09/21   [name], NP  latanoprost (XALATAN) 0.005 % ophthalmic solution Place 1 drop into both eyes at bedtime.    [provider]  lidocaine-prilocaine (EMLA) cream Apply 1 application topically every Monday, Wednesday, and Friday with hemodialysis.    [provider]  Menthol-Camphor (TIGER BALM ARTHRITIS RUB) 11-11 % CREA Apply 1 Application topically daily as needed (pain).    [provider]  Methoxy PEG-Epoetin Beta (MIRCERA IJ) Mircera 05/19/21 05/18/22  [provider]  multivitamin (RENA-VIT) TABS tablet Take 1 tablet by mouth daily.    [provider]  nitroGLYCERIN (NITROSTAT) 0.3 MG SL tablet PLACE 1 TABLET UNDER THE TONGUE AS NEEDED FOR CHEST PAIN 03/29/22   [name], MD  octreotide (SANDOSTATIN LAR DEPOT) 10 MG injection  Inject 10 mg into the muscle every 28 days. 04/21/22   Armbruster, Carlota Raspberry, MD  ondansetron (ZOFRAN) 4 MG tablet Take 1 tablet (4 mg total) by mouth every 8 (eight) hours as needed for nausea or vomiting. 06/15/21   [name], Dinah C, NP  oxyCODONE-acetaminophen (PERCOCET) 5-325 MG tablet Take 1 tablet by mouth every 6 (six) hours as needed for severe pain. 05/03/22 05/03/23  [name], Dinah C, NP  polyethylene glycol (MIRALAX / GLYCOLAX) 17 g packet Take 17 g by mouth daily as needed for mild constipation. 11/07/20   Aline August, MD  timolol (TIMOPTIC) 0.5 % ophthalmic solution Place 1 drop into both eyes in the morning. 04/04/19   [provider]      Allergies    Augmentin [amoxicillin-pot clavulanate], Morphine sulfate, Mucinex fast-max, and Amphetamines    Review of Systems   Review of Systems  All other systems reviewed and are negative.   Physical Exam Updated Vital Signs BP 120/69 (BP Location: Left Arm)   Pulse 80   Temp 97.9 F (36.6 C)   Resp 16   SpO2 96%  Physical Exam Vitals and nursing note reviewed.  Constitutional:      Appearance: He is well-developed.  HENT:     Head: Normocephalic.     Mouth/Throat:     Mouth: Mucous membranes are moist.  Eyes:     Pupils: Pupils are equal, round, and reactive to light.  Cardiovascular:     Rate and Rhythm: Normal rate.     Pulses: Normal pulses.  Pulmonary:     Effort: Pulmonary effort is normal.  Abdominal:     General: There is no distension.  Musculoskeletal:        General: Normal range of motion.     Cervical back: Normal range of motion.  Skin:    General: Skin is warm.  Neurological:     General: No focal deficit present.     Mental Status: He is alert and oriented to person, place, and time.  Psychiatric:        Mood and Affect: Mood normal.     ED Results / Procedures / Treatments   Labs (all labs ordered are listed, but only abnormal results are displayed) Labs Reviewed  CBC - Abnormal;  Notable for the following components:      Result Value   RBC 3.21 (*)    Hemoglobin 11.1 (*)    HCT 34.8 (*)    MCV 108.4 (*)    MCH 34.6 (*)    Platelets 131 (*)    All other components within normal limits  BASIC METABOLIC PANEL - Abnormal; Notable for the following components:   Chloride 96 (*)    Glucose, Bld 132 (*)    BUN 41 (*)    Creatinine, Ser 9.16 (*)    GFR, Estimated 6 (*)  Anion gap 17 (*)    All other components within normal limits  TYPE AND SCREEN    EKG None  Radiology DG Chest Portable 1 View  Result Date: 05/07/2022 CLINICAL DATA:  71 year old male status post dialysis catheter placement. EXAM: PORTABLE CHEST 1 VIEW COMPARISON:  Chest x-ray 02/08/2022. FINDINGS: Left-sided internal jugular PermCath with tip terminating in the distal superior vena cava. Lung volumes are normal. No consolidative airspace disease. No pleural effusions. No pneumothorax. No pulmonary nodule or mass noted. Pulmonary vasculature and the cardiomediastinal silhouette are within normal limits. Atherosclerosis in the thoracic aorta. Status post median sternotomy for CABG. Multiple old healed posterior right-sided rib fractures are incidentally noted. IMPRESSION: 1. No radiographic evidence of acute cardiopulmonary disease. 2. Aortic atherosclerosis. Electronically Signed   By: Vinnie Langton M.D.   On: 05/07/2022 08:38    Procedures Procedures    Medications Ordered in ED Medications - No data to display  ED Course/ Medical Decision Making/ A&P Clinical Course as of 05/07/22 1054  Sat May 07, 2022  0913 This is a 71 year old male presenting emerged part with concern for dialysis chest port dislodgment, which likely occurred 2 days ago, as he feels the cord may have been pulled while he rolled over in his sleep.  He did undergo normal dialysis yesterday and completed dialysis.  On exam of the chest port was extracted approximately 1 cm.  There is no active bleeding around the site.   No erythema.  X-ray personally reviewed and interpreted, showing port in appropriate placement.  I do believe it is still functional.  We will need to resecure the line, but it should remain functional.  He is planning to switch to using an arm fistula shortly, likely within the next month, per his report.  The chest port is simply temporary access. Of note, the patient does report to me at the end of our exam that he was told that his hemoglobin level was "6" by a PA provider earlier this week.  Per my review of his medical records, his hemoglobin was 11-12 last month.  We will need to recheck his blood counts in the ED today.  His vital signs are normal.  She does complain of some mild shortness of breath but it is difficult to determine whether this is acute or chronic. [MT]    Clinical Course User Index [MT] Trifan, Carola Rhine, MD                           Medical Decision Making Amount and/or Complexity of Data Reviewed External Data Reviewed: notes.    Details: Notes from dialysis center reviewed Labs: ordered. Decision-making details documented in ED Course.    Details: Labs are ordered reviewed and interpreted hemoglobin is 11.1 patient's potassium is normal today. Radiology: ordered and independent interpretation performed. Decision-making details documented in ED Course.    Details: X-ray shows permacath tip is in the vena cava  Risk Risk Details: Patient counseled on results he is advised to follow-up as scheduled for dialysis.  Patient does not seem to have any significant anemia today does not require transfusion.   MDM:        Final Clinical Impression(s) / ED Diagnoses Final diagnoses:  Complication of central venous catheter, unspecified complication, initial encounter  Anemia, unspecified type    Rx / DC Orders ED Discharge Orders     None      An After Visit Summary was  printed and given to the patient.    Fransico Meadow, [place] 05/07/22 1104    Wyvonnia Dusky, MD 05/07/22 [phone]

## 2022-05-07 NOTE — ED Notes (Signed)
States he is concerned about his dialysis cath left upper chest , states it got pulled yest. And was called yest afternoon and told to watch the site. No blood noted on dressing . Patient is a M-W-F dialysis

## 2022-05-07 NOTE — Progress Notes (Signed)
Patient stated dialysis cath was pulled out one day ago. Changed dressing due to gauze dressing for assess the HD cath. Cuff was visible and informed patient's RN & ED doctor. HS [employer]

## 2022-05-07 NOTE — ED Triage Notes (Signed)
Patient arrived with PTAR from home , patient noticed blood at his dialysis catheter yesterday and concerned about dislodgement .

## 2022-05-07 NOTE — ED Notes (Signed)
Called dialysis center [facility], spoke with [place] and gave her today's lab results. Hgb 11.1

## 2022-05-09 ENCOUNTER — Telehealth: Payer: Self-pay

## 2022-05-09 DIAGNOSIS — E1122 Type 2 diabetes mellitus with diabetic chronic kidney disease: Secondary | ICD-10-CM | POA: Diagnosis not present

## 2022-05-09 DIAGNOSIS — Z992 Dependence on renal dialysis: Secondary | ICD-10-CM | POA: Diagnosis not present

## 2022-05-09 DIAGNOSIS — N186 End stage renal disease: Secondary | ICD-10-CM | POA: Diagnosis not present

## 2022-05-09 DIAGNOSIS — E8779 Other fluid overload: Secondary | ICD-10-CM | POA: Diagnosis not present

## 2022-05-09 DIAGNOSIS — T8249XA Other complication of vascular dialysis catheter, initial encounter: Secondary | ICD-10-CM | POA: Diagnosis not present

## 2022-05-09 DIAGNOSIS — N2581 Secondary hyperparathyroidism of renal origin: Secondary | ICD-10-CM | POA: Diagnosis not present

## 2022-05-09 DIAGNOSIS — D631 Anemia in chronic kidney disease: Secondary | ICD-10-CM | POA: Diagnosis not present

## 2022-05-09 LAB — COMPREHENSIVE METABOLIC PANEL
Albumin: 4.1 (ref 3.5–5.0)
Calcium: 52 — AB (ref 8.7–10.7)
Globulin: 3.3

## 2022-05-09 LAB — HEMOGLOBIN A1C: Hemoglobin A1C: 6.1

## 2022-05-09 LAB — BASIC METABOLIC PANEL
BUN: 14 (ref 4–21)
Glucose: 144

## 2022-05-09 LAB — IRON,TIBC AND FERRITIN PANEL
Ferritin: 57
TIBC: 309
UIBC: 261

## 2022-05-09 LAB — HEPATIC FUNCTION PANEL: Alkaline Phosphatase: 96 (ref 25–125)

## 2022-05-09 MED FILL — Octreotide Acetate For IM Inj Kit 10 MG: INTRAMUSCULAR | Qty: 1 | Status: AC

## 2022-05-09 NOTE — Telephone Encounter (Signed)
Pt called stating that he had a question for the nurse about a f/u.  Reviewed pt's chart, returned pt's call for clarification, two identifiers used. Pt stated that the dialysis center was asking when he was supposed to this office so they could start using the new access. Discussed with Gwenette Greet, PA who recommended the pt be seen to evaluate since he'd had problems in the past. Appt made with pt, working around HD days. Confirmed understanding.

## 2022-05-10 ENCOUNTER — Other Ambulatory Visit: Payer: Self-pay | Admitting: Cardiovascular Disease

## 2022-05-10 ENCOUNTER — Ambulatory Visit: Payer: Medicare Other | Admitting: Family Medicine

## 2022-05-10 ENCOUNTER — Encounter (HOSPITAL_BASED_OUTPATIENT_CLINIC_OR_DEPARTMENT_OTHER): Payer: Medicare Other | Admitting: General Surgery

## 2022-05-10 DIAGNOSIS — I10 Essential (primary) hypertension: Secondary | ICD-10-CM | POA: Diagnosis not present

## 2022-05-10 DIAGNOSIS — L97512 Non-pressure chronic ulcer of other part of right foot with fat layer exposed: Secondary | ICD-10-CM | POA: Diagnosis not present

## 2022-05-10 DIAGNOSIS — I5042 Chronic combined systolic (congestive) and diastolic (congestive) heart failure: Secondary | ICD-10-CM | POA: Diagnosis not present

## 2022-05-10 DIAGNOSIS — J449 Chronic obstructive pulmonary disease, unspecified: Secondary | ICD-10-CM | POA: Diagnosis not present

## 2022-05-10 DIAGNOSIS — N186 End stage renal disease: Secondary | ICD-10-CM | POA: Diagnosis not present

## 2022-05-10 DIAGNOSIS — E1122 Type 2 diabetes mellitus with diabetic chronic kidney disease: Secondary | ICD-10-CM | POA: Diagnosis not present

## 2022-05-10 DIAGNOSIS — I132 Hypertensive heart and chronic kidney disease with heart failure and with stage 5 chronic kidney disease, or end stage renal disease: Secondary | ICD-10-CM | POA: Diagnosis not present

## 2022-05-10 DIAGNOSIS — E11621 Type 2 diabetes mellitus with foot ulcer: Secondary | ICD-10-CM | POA: Diagnosis not present

## 2022-05-10 NOTE — Progress Notes (Signed)
RAD, GRAMLING ([id number]) Visit Report for 05/10/2022 Chief Complaint Document Details Patient Name: Date of Service: Jacob [name], Jacob [name] 05/10/2022 1:15 PM Medical Record Number: [id number] Patient Account Number: [id number] Date of Birth/Sex: Treating RN: 1951/09/06 (71 y.o. [name] Primary Care Provider: Alain Honey Other Clinician: Referring Provider: Treating Provider/Extender: Neil Crouch in Treatment: 10 Information Obtained from: Patient Chief Complaint Patient seen for complaints of Non-Healing Wounds x 3 Electronic Signature(s) Signed: 05/10/2022 2:19:07 PM By: Fredirick Maudlin MD FACS Entered By: Fredirick Maudlin on 05/10/2022 14:19:07 -------------------------------------------------------------------------------- Debridement Details Patient Name: Date of Service: Jacob [name]. 05/10/2022 1:15 PM Medical Record Number: [id number] Patient Account Number: [id number] Date of Birth/Sex: Treating RN: 02/16/1951 (71 y.o. [name] Primary Care Provider: Alain Honey Other Clinician: Referring Provider: Treating Provider/Extender: Neil Crouch in Treatment: 10 Debridement Performed for Assessment: Wound #8 Right,Plantar Foot Performed By: Physician Fredirick Maudlin, MD Debridement Type: Debridement Severity of Tissue Pre Debridement: Fat layer exposed Level of Consciousness (Pre-procedure): Awake and Alert Pre-procedure Verification/Time Out Yes - 14:05 Taken: Start Time: 14:06 T Area Debrided (L x W): otal 0.8 (cm) x 0.8 (cm) = 0.64 (cm) Tissue and other material debrided: Viable, Non-Viable, Callus, Subcutaneous, Skin: Epidermis Level: Skin/Subcutaneous Tissue Debridement Description: Excisional Instrument: Curette Bleeding: Minimum Hemostasis Achieved: Pressure Procedural Pain: 0 Post Procedural Pain: 0 Response to Treatment: Procedure was tolerated well Level of Consciousness  (Post- Awake and Alert procedure): Post Debridement Measurements of Total Wound Length: (cm) 0.8 Width: (cm) 0.8 Depth: (cm) 0.3 Volume: (cm) 0.151 Character of Wound/Ulcer Post Debridement: Improved Severity of Tissue Post Debridement: Fat layer exposed Post Procedure Diagnosis Same as Pre-procedure Electronic Signature(s) Signed: 05/10/2022 4:14:01 PM By: Fredirick Maudlin MD FACS Signed: 05/10/2022 5:31:58 PM By: Baruch Gouty RN, BSN Entered By: Baruch Gouty on 05/10/2022 14:08:06 -------------------------------------------------------------------------------- HPI Details Patient Name: Date of Service: Jacob [name]. 05/10/2022 1:15 PM Medical Record Number: [id number] Patient Account Number: [id number] Date of Birth/Sex: Treating RN: 12-24-1950 (71 y.o. [name] Primary Care Provider: Alain Honey Other Clinician: Referring Provider: Treating Provider/Extender: Neil Crouch in Treatment: 10 History of Present Illness Location: 2 ulcers noted to the left hallux Quality: the patient denies pain Duration: these were originally blisters towards the end of February and opened in early March Context: these wounds are secondary to pressure; he got new shoes in February and also started wearing a toe cover/sleeve Modifying Factors: pressure and friction are exacerbating factor HPI Description: 01/12/17- Jacob [name] arrives for initial evaluation of 2 ulcers to his left hallux. He states that back in February he did receive a new pair of diabetic shoes and at that time he started wearing toe cover/sleeves and noticed development of a blister to the dorsal and plantar aspect. He has been seen at [facility] by both Dr. Amalia Hailey and Dr. Jacqualyn Posey. He last saw Dr.Wagoner on 3/8 at which time he was prescribed Silvadene and Keflex. He completed that dose of Keflex and states that he got a phone call to extend the Keflex prior to this appointment.  He has a follow-up with Dr. Amalia Hailey on 3/26. He has been wearing a surgical shoe, no significant offloading to the plantar/medial ulcer but the strap does not extend over the dorsal ulcer. Topically he has been applying the Silvadene daily and a dry dressing. He is a diabetic, on insulin, with a recent A1c of 6.2% (11/10/16). He  is a current smoker, 0.5 PPD. He has no known diagnosis of arterial insufficiency. 01/20/17- The patient arrives for follow up evaluation of his ulcers to the left hallux. He admits to continuing to smoke approximately 0.5ppd. He states that his diet has been somewhat uncontrolled with higher than normal glucose levels, this seems circumstantial as he had a death in the family as food and schedule has been disrupted. he admits to wearing the surgical show with offloading modifications while out of the home, but has been ambulating barefoot around the home. he saw Dr. Amalia Hailey yesterday for toenail trimming and callus shaving to the right plantar foot and investigation of his ulcers. 01/26/17- he is here for follow-up evaluation of his left hallux ulcers. He states he has been significantly more compliant in the use of his offloading surgical shoe, with the exception of "getting up for a glass of water" he states that he has worn it with any ambulation. He states he has decreased his smoking too, admitting to only a cigarette since yesterday. He states his blood sugar this morning was 147 and yesterday was 116. He is voicing no complaints or concerns 02/02/17- He is here for follow-up evaluation of his left hallux ulcers. The dorsal ulcer is crusted over/healed. He states he has minimized his ambulation over this past week, much less than the previous week. He states his glucose levels have been between 120-150 about half of the time, otherwise greater than 150. He cannot correlate any specific dietary changes. He does have an appointment with a dietitian next month. He was advised to keep  a log of his glucose levels and his diet for that appointment. 02/06/17; patient comes in today for first application of the total contact cast. Her intake nurse reported the wounds look better therefore I did not actually see these before application of the cast. He'll return on Thursday for reapplication in the standard fashion 02/10/27- he is here for follow-up evaluation of his left plantar hallux ulcer. He came in Monday for application of a total contact cast. He states that he was tolerating that Monday, but noticed on Tuesday that his foot began to slide in the cast. He also is complaining of the heaviness of the cast, as he ambulates as his primary form of transportation. He continues to smoke,. He has not contacted his cardiologist regarding Nicorette gum. He states his blood sugars have been slightly elevated, this morning being 206. He states that he will complete an antibiotic tonight. He states this was prescribed by Dr. Jacqualyn Posey. He states that he has refilled this prescription twice, without direction to continue taking this medication. 02/16/17; patient wound about the same as last week per her description. Rolled edges of callus scan and nonviable subcutaneous tissue. Using Prisma. Apparently used topical antibiotic instead of the Prisma over the course of this week. He has not a total contact cast candidate secondary to ambulation needs and unsteadiness on his feet per discussion with our intake nurse 03/02/17 patient requires debridement. He has a small wound on the right great toe which probes however does not approach bone. What I can see of the base of this wound looks reasonably healthy and I think this is largely in offloading issue with the second toe crossing over the first. He did not tolerate a total contact cast has balance issues 5/171/8. 0.2x0.2x0.2 less depth. Using prisma 03/16/17; no major change. Still a small probing wound with copious amounts of surrounding callus skin  nonviable subcutaneous tissue  all of which debrided. Still using Prisma. I suspect the dimensions post debridement are about the same as listed above 0.2 x 0.2 x 0.2 03/23/17; no major change using Prisma. Once again a large amount of callus nonviable subcutaneous tissue around the wound. I went over the offloading issues with the patient and the nurse. He did not tolerate a total contact cast he uses public transportation among other issues. 03/30/17; no major change using Prisma. Once again a large amount of callus and nonviable tissue around the wound requires debridement. Saw Dr. Amalia Hailey who pared calluses on the other foot. 04/06/17; the wound is much smaller there is callus around this although I think I can see the base of the wound and I elected not to do any debridement. 04/13/17 this is a wound I did not debridement last week left callus intact. He arrives today with the area much in the same predicament however this time I remove the callus to find a deep probing hole. This is disappointing this is not go to bone. We have been using Prisma. 04/20/17 plantar left first toe. Again although a lot of circumferential callus around the wound which requires debridement however this does not appear to be a probing area. Culture I did last week show coag-negative staph. I thought this was probably not something that was worth treating but In the interim he was seen by his primary care with parotitis. He is now on clindamycin and Cipro and the Staphylococcus we cultured is Cipro sensitive [lugdunensis} 04/27/17; plantar left first toe. Again a lot of circumferential callus around a small open area. Removing the callus also nonviable subcutaneous tissue. The wound looks better today. He has completed his antibiotics for the parotitis. I didn't think additional antibiotics were necessary in terms of the wound 05/11/17; plantar first toe. I thought this might be closed this week however once again he has a deep  probing small hole. Using pickups and a scalpel debridement of thick callus and subcutaneous tissue from around the wound reveals a depth of about 0.4 cm. We have not been able to offload the area. There is no obvious infection here. No evidence of ischemia 05/18/17; since his last visit he's been in hospital for oral surgery apparently to remove a retained surgical screw. He has swelling of his jaw and has some discomfort. He arrives today with the plantar left first toe in the same predicament. No major change. Each time he comes in here he has thick adherent callus 05/25/17; arrives today with roughly the same condition of the plantar left great toe. Perhaps somewhat better less callus. There are no options for offloading. He is on his feet a lot and I think this is largely a pressure issue X-ray I did last week showed no evidence of osteomyelitis. He has a toe separator as the great toe tends to ride under the second toe when he is walking. I'm hopeful that this will allow for a little less pressure on the plantar aspect of the left great toe 06/07/2017 -- he is working hard on giving up smoking and other than that has been wearing his diabetic shoes and is also trying to offload as much as possible. 06/15/17; patient arrived in clinic today with a difficult area on his left medial great toe closed over. There is still amount of callus on this however I don't believe there is any underlying open wound. He has custom shoes with inserts. I still think he is going to need  to offload this area going forward. He also follows with Dr. Amalia Hailey of podiatry for routine foot care READMISSION 11/01/16; patient arrives in clinic today with a five-day history of a new area on the previously problematic left great toe. He is wearing his diabetic shoes and he apparently has a new pair coming. The patient is a type II diabetic with neuropathy. He follows with Dr. Amalia Hailey of podiatry for pre-ulcerative callus on  his bilateral feet last seen on 08/24/17. He has developed a large blister on top of his left great toe this is since gone down somewhat. He is not been dressing this specifically. He does not have a history of significant PAD previous ABIs in this clinic were1.1 11/06/17; left great toe has epithelialized since last time. I think this was caused by a insoles an issue pushing the toe against the dorsal aspect of the shoe itself has new diabetic shoes coming hopefully will have more forefoot depth. READMISSION 03/01/2022 This is a now 71 year old man with a past medical history significant for type 2 diabetes mellitus with end-stage renal disease on hemodialysis as as well as congestive heart failure, ischemic cardiomyopathy, hypertension, and prior diabetic foot ulcers. He has had multiple issues with his hemodialysis access. He had a prior left radiocephalic fistula that failed. In October 2020, he had a left brachiobasilic fistula. He apparently underwent a procedure at an outside vascular office on November 09, 2021. Subsequent to that, he developed a wound on his left posterior forearm just distal to the elbow as well as wounds on 2 of his left digits. He was diagnosed with steal syndrome and recently underwent ligation of his fistula with placement of a hemodialysis catheter. He is here today for assistance in healing the wounds that developed related to steal syndrome. He says that he has been applying Neosporin to the sites and they are fairly sensitive to the touch. 03/08/2022: The wounds on his fingers are much smaller and have just a little bit of accumulated thin eschar. The wound on his left posterior forearm is also smaller but has accumulated both slough and eschar. He also has a thick callus on his right fifth metatarsal head plantar surface and unfortunately, it appears that there is an open wound underneath the callus. We have been using Iodosorb with Hydrofera Blue to all of his open  wounds. Pain is much improved from last week. 03/15/2022: The wounds on his fingers have healed. The wound on his left posterior forearm is smaller and has a nice layer of granulation tissue with just a bit of accumulated slough. He has built up additional callus around the right fifth metatarsal head plantar surface, but the underlying wound has a good surface with granulation tissue present. We have been using Iodosorb with Hydrofera Blue. He did not endorse any pain this week. 03/22/2022: The wound on his left posterior forearm continues to contract. There is good granulation tissue present and just a little bit of periwound eschar and overlying slough. The right fifth metatarsal head wound has once again accumulated some callus and there is slough within the wound. The intake nurse measured a area of undermining from 7-10 o'clock. 03/29/2022: The patient saw podiatry this week for nail trim and they also pared back a number of calluses. The wound on his left posterior forearm is substantially smaller with just some overlying thin eschar. The right fifth metatarsal head has a bit of callus accumulation as well as some slough in the wound bed but is  otherwise clean. It is smaller today, as well. 04/05/2022: The wound on his left posterior forearm is nearly closed with just a small amount of overlying eschar. The right fifth metatarsal head wound is smaller again this week. There is some periwound callus accumulation and some slough in the wound bed. He is going to get new dialysis access placed next week. 04/19/2022: The wound on his left posterior forearm is closed. The right fifth metatarsal head wound is about the same size with periwound callus and slough accumulation. He did have a new right forearm PTFE AV graft placed last week. His right forearm and hand are quite swollen, warm, and red today. He was apparently seen in the emergency room yesterday and they did not think it was infected and  attributed the swelling to normal postop findings. 04/28/2022: The wound on his foot has accumulated a lot of periwound callus, but the actual open surface is quite small. There is just a little bit of slough on the surface. 05/03/2022: His wound is even smaller today with less periwound callus accumulation. He continues to build up slough but the underlying tissue is healthy and viable-appearing. 05/10/2022: The orifice of the wound continues to contract, but there is some undermining and periwound callus formation. No significant accumulation of slough. Electronic Signature(s) Signed: 05/10/2022 2:19:42 PM By: Fredirick Maudlin MD FACS Entered By: Fredirick Maudlin on 05/10/2022 14:19:41 -------------------------------------------------------------------------------- Physical Exam Details Patient Name: Date of Service: Jacob [name]. 05/10/2022 1:15 PM Medical Record Number: [id number] Patient Account Number: [id number] Date of Birth/Sex: Treating RN: [birth year] (71 y.o. [name] Primary Care Provider: Alain Honey Other Clinician: Referring Provider: Treating Provider/Extender: Neil Crouch in Treatment: 10 Constitutional . . . . No acute distress.[name] Respiratory Normal work of breathing on room air.. Notes 05/10/2022: The orifice of the wound continues to contract, but there is some undermining and periwound callus formation. No significant accumulation of slough. Electronic Signature(s) Signed: 05/10/2022 2:20:34 PM By: Fredirick Maudlin MD FACS Entered By: Fredirick Maudlin on 05/10/2022 14:20:33 -------------------------------------------------------------------------------- Physician Orders Details Patient Name: Date of Service: Jacob [name]. 05/10/2022 1:15 PM Medical Record Number: [id number] Patient Account Number: [id number] Date of Birth/Sex: Treating RN: 1951/03/25 (71 y.o. [name] Primary Care Provider: Alain Honey  Other Clinician: Referring Provider: Treating Provider/Extender: Neil Crouch in Treatment: 10 Verbal / Phone Orders: No Diagnosis Coding ICD-10 Coding Code Description [phone] Non-pressure chronic ulcer of other part of right foot with fat layer exposed E11.22 Type 2 diabetes mellitus with diabetic chronic kidney disease J44.9 Chronic obstructive pulmonary disease, unspecified I50.42 Chronic combined systolic (congestive) and diastolic (congestive) heart failure I10 Essential (primary) hypertension N25.81 Secondary hyperparathyroidism of renal origin N18.6 End stage renal disease B20 Human immunodeficiency virus [HIV] disease Follow-up Appointments ppointment in 1 week. - Dr. Celine Ahr - Room 1 Return A Tuesday 7/25 @ 1:15 pm Bathing/ Shower/ Hygiene Other Bathing/Shower/Hygiene Orders/Instructions: - Change dressing after bathing Off-Loading Other: - wear custom diabetic shoes at al times when walking Wound Treatment Wound #8 - Foot Wound Laterality: Plantar, Right Cleanser: Soap and Water Every Other Day/7 Days Discharge Instructions: May shower and wash wound with dial antibacterial soap and water prior to dressing change. Cleanser: Wound Cleanser (Generic) Every Other Day/7 Days Discharge Instructions: Cleanse the wound with wound cleanser prior to applying a clean dressing using gauze sponges, not tissue or cotton balls. Prim Dressing: KerraCel Ag Gelling Fiber Dressing, 2x2 in (silver alginate)  Every Other Day/7 Days ary Discharge Instructions: Apply silver alginate to wound bed as instructed Secondary Dressing: Bordered Gauze, 4x4 in (Generic) Every Other Day/7 Days Discharge Instructions: Apply over primary dressing as directed. Secondary Dressing: Optifoam Non-Adhesive Dressing, 4x4 in Every Other Day/7 Days Discharge Instructions: Apply over primary dressing cut to make foam donut Electronic Signature(s) Signed: 05/10/2022 4:14:01 PM By:  Fredirick Maudlin MD FACS Entered By: Fredirick Maudlin on 05/10/2022 14:20:50 -------------------------------------------------------------------------------- Problem List Details Patient Name: Date of Service: Jacob [name]. 05/10/2022 1:15 PM Medical Record Number: [id number] Patient Account Number: [id number] Date of Birth/Sex: Treating RN: [birth year] (71 y.o. [name] Primary Care Provider: Alain Honey Other Clinician: Referring Provider: Treating Provider/Extender: Neil Crouch in Treatment: 10 Active Problems ICD-10 Encounter Code Description Active Date MDM Diagnosis [phone] Non-pressure chronic ulcer of other part of right foot with fat layer exposed 03/08/2022 No Yes E11.22 Type 2 diabetes mellitus with diabetic chronic kidney disease 03/01/2022 No Yes J44.9 Chronic obstructive pulmonary disease, unspecified 03/01/2022 No Yes I50.42 Chronic combined systolic (congestive) and diastolic (congestive) heart failure 03/01/2022 No Yes I10 Essential (primary) hypertension 03/01/2022 No Yes N25.81 Secondary hyperparathyroidism of renal origin 03/01/2022 No Yes N18.6 End stage renal disease 03/01/2022 No Yes B20 Human immunodeficiency virus [HIV] disease 03/01/2022 No Yes Inactive Problems Resolved Problems ICD-10 Code Description Active Date Resolved Date L98.499 Non-pressure chronic ulcer of skin of other sites with unspecified severity 03/01/2022 03/01/2022 Electronic Signature(s) Signed: 05/10/2022 2:18:32 PM By: Fredirick Maudlin MD FACS Entered By: Fredirick Maudlin on 05/10/2022 14:18:32 -------------------------------------------------------------------------------- Progress Note Details Patient Name: Date of Service: Jacob [name]. 05/10/2022 1:15 PM Medical Record Number: [id number] Patient Account Number: [id number] Date of Birth/Sex: Treating RN: 11/15/1950 (71 y.o. [name] Primary Care Provider: Alain Honey Other  Clinician: Referring Provider: Treating Provider/Extender: Neil Crouch in Treatment: 10 Subjective Chief Complaint Information obtained from Patient Patient seen for complaints of Non-Healing Wounds x 3 History of Present Illness (HPI) The following HPI elements were documented for the patient's wound: Location: 2 ulcers noted to the left hallux Quality: the patient denies pain Duration: these were originally blisters towards the end of February and opened in early March Context: these wounds are secondary to pressure; he got new shoes in February and also started wearing a toe cover/sleeve Modifying Factors: pressure and friction are exacerbating factor 01/12/17- Mr. Pumphrey arrives for initial evaluation of 2 ulcers to his left hallux. He states that back in February he did receive a new pair of diabetic shoes and at that time he started wearing toe cover/sleeves and noticed development of a blister to the dorsal and plantar aspect. He has been seen at [facility] by both Dr. Amalia Hailey and Dr. Jacqualyn Posey. He last saw Dr.Wagoner on 3/8 at which time he was prescribed Silvadene and Keflex. He completed that dose of Keflex and states that he got a phone call to extend the Keflex prior to this appointment. He has a follow-up with Dr. Amalia Hailey on 3/26. He has been wearing a surgical shoe, no significant offloading to the plantar/medial ulcer but the strap does not extend over the dorsal ulcer. T opically he has been applying the Silvadene daily and a dry dressing. He is a diabetic, on insulin, with a recent A1c of 6.2% (11/10/16). He is a current smoker, 0.5 PPD. He has no known diagnosis of arterial insufficiency. 01/20/17- The patient arrives for follow up evaluation of his ulcers  to the left hallux. He admits to continuing to smoke approximately 0.5ppd. He states that his diet has been somewhat uncontrolled with higher than normal glucose levels, this seems circumstantial  as he had a death in the family as food and schedule has been disrupted. he admits to wearing the surgical show with offloading modifications while out of the home, but has been ambulating barefoot around the home. he saw Dr. Amalia Hailey yesterday for toenail trimming and callus shaving to the right plantar foot and investigation of his ulcers. 01/26/17- he is here for follow-up evaluation of his left hallux ulcers. He states he has been significantly more compliant in the use of his offloading surgical shoe, with the exception of "getting up for a glass of water" he states that he has worn it with any ambulation. He states he has decreased his smoking too, admitting to only a cigarette since yesterday. He states his blood sugar this morning was 147 and yesterday was 116. He is voicing no complaints or concerns 02/02/17- He is here for follow-up evaluation of his left hallux ulcers. The dorsal ulcer is crusted over/healed. He states he has minimized his ambulation over this past week, much less than the previous week. He states his glucose levels have been between 120-150 about half of the time, otherwise greater than 150. He cannot correlate any specific dietary changes. He does have an appointment with a dietitian next month. He was advised to keep a log of his glucose levels and his diet for that appointment. 02/06/17; patient comes in today for first application of the total contact cast. Her intake nurse reported the wounds look better therefore I did not actually see these before application of the cast. He'll return on Thursday for reapplication in the standard fashion 02/10/27- he is here for follow-up evaluation of his left plantar hallux ulcer. He came in Monday for application of a total contact cast. He states that he was tolerating that Monday, but noticed on Tuesday that his foot began to slide in the cast. He also is complaining of the heaviness of the cast, as he ambulates as his primary form of  transportation. He continues to smoke,. He has not contacted his cardiologist regarding Nicorette gum. He states his blood sugars have been slightly elevated, this morning being 206. He states that he will complete an antibiotic tonight. He states this was prescribed by Dr. Jacqualyn Posey. He states that he has refilled this prescription twice, without direction to continue taking this medication. 02/16/17; patient wound about the same as last week per her description. Rolled edges of callus scan and nonviable subcutaneous tissue. Using Prisma. Apparently used topical antibiotic instead of the Prisma over the course of this week. He has not a total contact cast candidate secondary to ambulation needs and unsteadiness on his feet per discussion with our intake nurse 03/02/17 patient requires debridement. He has a small wound on the right great toe which probes however does not approach bone. What I can see of the base of this wound looks reasonably healthy and I think this is largely in offloading issue with the second toe crossing over the first. He did not tolerate a total contact cast has balance issues 5/171/8. 0.2x0.2x0.2 less depth. Using prisma 03/16/17; no major change. Still a small probing wound with copious amounts of surrounding callus skin nonviable subcutaneous tissue all of which debrided. Still using Prisma. I suspect the dimensions post debridement are about the same as listed above 0.2 x 0.2 x 0.2  03/23/17; no major change using Prisma. Once again a large amount of callus nonviable subcutaneous tissue around the wound. I went over the offloading issues with the patient and the nurse. He did not tolerate a total contact cast he uses public transportation among other issues. 03/30/17; no major change using Prisma. Once again a large amount of callus and nonviable tissue around the wound requires debridement. Saw Dr. Amalia Hailey who pared calluses on the other foot. 04/06/17; the wound is much smaller  there is callus around this although I think I can see the base of the wound and I elected not to do any debridement. 04/13/17 this is a wound I did not debridement last week left callus intact. He arrives today with the area much in the same predicament however this time I remove the callus to find a deep probing hole. This is disappointing this is not go to bone. We have been using Prisma. 04/20/17 plantar left first toe. Again although a lot of circumferential callus around the wound which requires debridement however this does not appear to be a probing area. Culture I did last week show coag-negative staph. I thought this was probably not something that was worth treating but In the interim he was seen by his primary care with parotitis. He is now on clindamycin and Cipro and the Staphylococcus we cultured is Cipro sensitive [lugdunensis} 04/27/17; plantar left first toe. Again a lot of circumferential callus around a small open area. Removing the callus also nonviable subcutaneous tissue. The wound looks better today. He has completed his antibiotics for the parotitis. I didn't think additional antibiotics were necessary in terms of the wound 05/11/17; plantar first toe. I thought this might be closed this week however once again he has a deep probing small hole. Using pickups and a scalpel debridement of thick callus and subcutaneous tissue from around the wound reveals a depth of about 0.4 cm. We have not been able to offload the area. There is no obvious infection here. No evidence of ischemia 05/18/17; since his last visit he's been in hospital for oral surgery apparently to remove a retained surgical screw. He has swelling of his jaw and has some discomfort. He arrives today with the plantar left first toe in the same predicament. No major change. Each time he comes in here he has thick adherent callus 05/25/17; arrives today with roughly the same condition of the plantar left great toe. Perhaps  somewhat better less callus. There are no options for offloading. He is on his feet a lot and I think this is largely a pressure issue X-ray I did last week showed no evidence of osteomyelitis. He has a toe separator as the great toe tends to ride under the second toe when he is walking. I'm hopeful that this will allow for a little less pressure on the plantar aspect of the left great toe 06/07/2017 -- he is working hard on giving up smoking and other than that has been wearing his diabetic shoes and is also trying to offload as much as possible. 06/15/17; patient arrived in clinic today with a difficult area on his left medial great toe closed over. There is still amount of callus on this however I don't believe there is any underlying open wound. He has custom shoes with inserts. I still think he is going to need to offload this area going forward. He also follows with Dr. Amalia Hailey of podiatry for routine foot care READMISSION 11/01/16; patient arrives in clinic  today with a five-day history of a new area on the previously problematic left great toe. He is wearing his diabetic shoes and he apparently has a new pair coming. The patient is a type II diabetic with neuropathy. He follows with Dr. Amalia Hailey of podiatry for pre-ulcerative callus on his bilateral feet last seen on 08/24/17. He has developed a large blister on top of his left great toe this is since gone down somewhat. He is not been dressing this specifically. He does not have a history of significant PAD previous ABIs in this clinic were1.1 11/06/17; left great toe has epithelialized since last time. I think this was caused by a insoles an issue pushing the toe against the dorsal aspect of the shoe itself has new diabetic shoes coming hopefully will have more forefoot depth. READMISSION 03/01/2022 This is a now 71 year old man with a past medical history significant for type 2 diabetes mellitus with end-stage renal disease on hemodialysis as as  well as congestive heart failure, ischemic cardiomyopathy, hypertension, and prior diabetic foot ulcers. He has had multiple issues with his hemodialysis access. He had a prior left radiocephalic fistula that failed. In October 2020, he had a left brachiobasilic fistula. He apparently underwent a procedure at an outside vascular office on November 09, 2021. Subsequent to that, he developed a wound on his left posterior forearm just distal to the elbow as well as wounds on 2 of his left digits. He was diagnosed with steal syndrome and recently underwent ligation of his fistula with placement of a hemodialysis catheter. He is here today for assistance in healing the wounds that developed related to steal syndrome. He says that he has been applying Neosporin to the sites and they are fairly sensitive to the touch. 03/08/2022: The wounds on his fingers are much smaller and have just a little bit of accumulated thin eschar. The wound on his left posterior forearm is also smaller but has accumulated both slough and eschar. He also has a thick callus on his right fifth metatarsal head plantar surface and unfortunately, it appears that there is an open wound underneath the callus. We have been using Iodosorb with Hydrofera Blue to all of his open wounds. Pain is much improved from last week. 03/15/2022: The wounds on his fingers have healed. The wound on his left posterior forearm is smaller and has a nice layer of granulation tissue with just a bit of accumulated slough. He has built up additional callus around the right fifth metatarsal head plantar surface, but the underlying wound has a good surface with granulation tissue present. We have been using Iodosorb with Hydrofera Blue. He did not endorse any pain this week. 03/22/2022: The wound on his left posterior forearm continues to contract. There is good granulation tissue present and just a little bit of periwound eschar and overlying slough. The right  fifth metatarsal head wound has once again accumulated some callus and there is slough within the wound. The intake nurse measured a area of undermining from 7-10 o'clock. 03/29/2022: The patient saw podiatry this week for nail trim and they also pared back a number of calluses. The wound on his left posterior forearm is substantially smaller with just some overlying thin eschar. The right fifth metatarsal head has a bit of callus accumulation as well as some slough in the wound bed but is otherwise clean. It is smaller today, as well. 04/05/2022: The wound on his left posterior forearm is nearly closed with just a small amount  of overlying eschar. The right fifth metatarsal head wound is smaller again this week. There is some periwound callus accumulation and some slough in the wound bed. He is going to get new dialysis access placed next week. 04/19/2022: The wound on his left posterior forearm is closed. The right fifth metatarsal head wound is about the same size with periwound callus and slough accumulation. He did have a new right forearm PTFE AV graft placed last week. His right forearm and hand are quite swollen, warm, and red today. He was apparently seen in the emergency room yesterday and they did not think it was infected and attributed the swelling to normal postop findings. 04/28/2022: The wound on his foot has accumulated a lot of periwound callus, but the actual open surface is quite small. There is just a little bit of slough on the surface. 05/03/2022: His wound is even smaller today with less periwound callus accumulation. He continues to build up slough but the underlying tissue is healthy and viable-appearing. 05/10/2022: The orifice of the wound continues to contract, but there is some undermining and periwound callus formation. No significant accumulation of slough. Patient History Information obtained from Patient. Family History Diabetes - Siblings,Father, Heart Disease -  Father,Siblings, Hypertension - Father,Siblings, No family history of Cancer, Hereditary Spherocytosis, Kidney Disease, Lung Disease, Seizures, Stroke, Thyroid Problems, Tuberculosis. Social History Current every day smoker - 1/2 ppd / 14yr, Marital Status - Single, Alcohol Use - Moderate - 6 standard drinks per week, Drug Use - Prior History - Polysubstance Abuse; clean for 9 years, Caffeine Use - Rarely - tea. Medical History Eyes Patient has history of Cataracts - mild, Glaucoma Denies history of Optic Neuritis Ear/Nose/Mouth/Throat Denies history of Chronic sinus problems/congestion, Middle ear problems Hematologic/Lymphatic Patient has history of Anemia Denies history of Hemophilia, Human Immunodeficiency Virus, Lymphedema, Sickle Cell Disease Respiratory Patient has history of Chronic Obstructive Pulmonary Disease (COPD) Denies history of Aspiration, Asthma, Pneumothorax, Sleep Apnea, Tuberculosis Cardiovascular Patient has history of Congestive Heart Failure, Coronary Artery Disease, Hypertension, Myocardial Infarction - 2004 Denies history of Angina, Arrhythmia, Deep Vein Thrombosis, Hypotension, Peripheral Arterial Disease, Peripheral Venous Disease, Phlebitis, Vasculitis Gastrointestinal Patient has history of Hepatitis C - 1968 Denies history of Cirrhosis , Colitis, Crohnoos, Hepatitis A, Hepatitis B Endocrine Patient has history of Type II Diabetes Denies history of Type I Diabetes Genitourinary Denies history of End Stage Renal Disease Immunological Denies history of Lupus Erythematosus, Raynaudoos, Scleroderma Integumentary (Skin) Denies history of History of Burn Musculoskeletal Patient has history of Gout, Osteoarthritis Denies history of Rheumatoid Arthritis, Osteomyelitis Neurologic Patient has history of Neuropathy Denies history of Dementia, Quadriplegia, Paraplegia, Seizure Disorder Oncologic Denies history of Received Chemotherapy, Received  Radiation Psychiatric Denies history of Anorexia/bulimia, Confinement Anxiety Hospitalization/Surgery History - Colonoscopy. - CABG. - Laproscopic Cholecystectomy. - Left Hip Fx. - Left Jaw Fx. - Lumbar Laminectomy. Medical A Surgical History Notes nd Constitutional Symptoms (General Health) Insomnia Syncope Alcohol Dependence HIV Hematologic/Lymphatic Hyperlipidemia Hyperglycemia Cardiovascular CABG, ischemic cardiomyopathy Gastrointestinal GERD Genitourinary Syphilis CKD,Nocturia Immunological HIV Musculoskeletal Hip Fx BiLateral Bunions Unspecified Arthritis Neurologic chronic back pain Objective Constitutional No acute distress.. Vitals Time Taken: 1:44 PM, Height: 74 in, Weight: 172 lbs, BMI: 22.1, Temperature: 97.9 F, Pulse: 71 bpm, Respiratory Rate: 16 breaths/min, Blood Pressure: 127/59 mmHg, Capillary Blood Glucose: 285 mg/dl. General Notes: glucose per pt report this am Respiratory Normal work of breathing on room air.. General Notes: 05/10/2022: The orifice of the wound continues to contract, but there is some undermining and  periwound callus formation. No significant accumulation of slough. Integumentary (Hair, Skin) Wound #8 status is Open. Original cause of wound was Blister. The date acquired was: 03/08/2022. The wound has been in treatment 9 weeks. The wound is located on the [place]. The wound measures 0.2cm length x 0.1cm width x 0.4cm depth; 0.016cm^2 area and 0.006cm^3 volume. There is Fat Layer (Subcutaneous Tissue) exposed. There is no tunneling noted, however, there is undermining starting at 12:00 and ending at 12:00 with a maximum distance of 0.3cm. There is a small amount of serosanguineous drainage noted. The wound margin is thickened. There is no granulation within the wound bed. There is no necrotic tissue within the wound bed. General Notes: unable to visualize wound bed due to small opening Assessment Active  Problems ICD-10 Non-pressure chronic ulcer of other part of right foot with fat layer exposed Type 2 diabetes mellitus with diabetic chronic kidney disease Chronic obstructive pulmonary disease, unspecified Chronic combined systolic (congestive) and diastolic (congestive) heart failure Essential (primary) hypertension Secondary hyperparathyroidism of renal origin End stage renal disease Human immunodeficiency virus [HIV] disease Procedures Wound #8 Pre-procedure diagnosis of Wound #8 is a Diabetic Wound/Ulcer of the Lower Extremity located on the [place] .Severity of Tissue Pre Debridement is: Fat layer exposed. There was a Excisional Skin/Subcutaneous Tissue Debridement with a total area of 0.64 sq cm performed by Fredirick Maudlin, MD. With the following instrument(s): Curette to remove Viable and Non-Viable tissue/material. Material removed includes Callus, Subcutaneous Tissue, and Skin: Epidermis. No specimens were taken. A time out was conducted at 14:05, prior to the start of the procedure. A Minimum amount of bleeding was controlled with Pressure. The procedure was tolerated well with a pain level of 0 throughout and a pain level of 0 following the procedure. Post Debridement Measurements: 0.8cm length x 0.8cm width x 0.3cm depth; 0.151cm^3 volume. Character of Wound/Ulcer Post Debridement is improved. Severity of Tissue Post Debridement is: Fat layer exposed. Post procedure Diagnosis Wound #8: Same as Pre-Procedure Plan Follow-up Appointments: Return Appointment in 1 week. - Dr. Celine Ahr - Room 1 Tuesday 7/25 @ 1:15 pm Bathing/ Shower/ Hygiene: Other Bathing/Shower/Hygiene Orders/Instructions: - Change dressing after bathing Off-Loading: Other: - wear custom diabetic shoes at al times when walking WOUND #8: - Foot Wound Laterality: Plantar, Right Cleanser: Soap and Water Every Other Day/7 Days Discharge Instructions: May shower and wash wound with dial antibacterial soap  and water prior to dressing change. Cleanser: Wound Cleanser (Generic) Every Other Day/7 Days Discharge Instructions: Cleanse the wound with wound cleanser prior to applying a clean dressing using gauze sponges, not tissue or cotton balls. Prim Dressing: KerraCel Ag Gelling Fiber Dressing, 2x2 in (silver alginate) Every Other Day/7 Days ary Discharge Instructions: Apply silver alginate to wound bed as instructed Secondary Dressing: Bordered Gauze, 4x4 in (Generic) Every Other Day/7 Days Discharge Instructions: Apply over primary dressing as directed. Secondary Dressing: Optifoam Non-Adhesive Dressing, 4x4 in Every Other Day/7 Days Discharge Instructions: Apply over primary dressing cut to make foam donut 05/10/2022: The orifice of the wound continues to contract, but there is some undermining and periwound callus formation. No significant accumulation of slough. I used a curette to debride the periwound callus away from the orifice and to debride the skin that was contributing to the undermined portion of the wound. The wound at the end of the procedure was nicely saucerized. We will continue using silver alginate. He was reminded that he needs to wear his diabetic shoes rather than the slides that  he wore to clinic today. Follow-up in 1 week. Electronic Signature(s) Signed: 05/10/2022 2:21:42 PM By: Fredirick Maudlin MD FACS Entered By: Fredirick Maudlin on 05/10/2022 14:21:41 -------------------------------------------------------------------------------- HxROS Details Patient Name: Date of Service: Jacob [name]. 05/10/2022 1:15 PM Medical Record Number: [id number] Patient Account Number: [id number] Date of Birth/Sex: Treating RN: 28-Oct-1950 (71 y.o. [name] Primary Care Provider: Other Clinician: Alain Honey Referring Provider: Treating Provider/Extender: Neil Crouch in Treatment: 10 Information Obtained From Patient Constitutional  Symptoms (General Health) Medical History: Past Medical History Notes: Insomnia Syncope Alcohol Dependence HIV Eyes Medical History: Positive for: Cataracts - mild; Glaucoma Negative for: Optic Neuritis Ear/Nose/Mouth/Throat Medical History: Negative for: Chronic sinus problems/congestion; Middle ear problems Hematologic/Lymphatic Medical History: Positive for: Anemia Negative for: Hemophilia; Human Immunodeficiency Virus; Lymphedema; Sickle Cell Disease Past Medical History Notes: Hyperlipidemia Hyperglycemia Respiratory Medical History: Positive for: Chronic Obstructive Pulmonary Disease (COPD) Negative for: Aspiration; Asthma; Pneumothorax; Sleep Apnea; Tuberculosis Cardiovascular Medical History: Positive for: Congestive Heart Failure; Coronary Artery Disease; Hypertension; Myocardial Infarction - 2004 Negative for: Angina; Arrhythmia; Deep Vein Thrombosis; Hypotension; Peripheral Arterial Disease; Peripheral Venous Disease; Phlebitis; Vasculitis Past Medical History Notes: CABG, ischemic cardiomyopathy Gastrointestinal Medical History: Positive for: Hepatitis C - 1968 Negative for: Cirrhosis ; Colitis; Crohns; Hepatitis A; Hepatitis B Past Medical History Notes: GERD Endocrine Medical History: Positive for: Type II Diabetes Negative for: Type I Diabetes Time with diabetes: 13 yrs Treated with: Insulin Blood sugar tested every day: Yes Tested : 2 times per day Blood sugar testing results: Breakfast: 100; Dinner: 95-126 Genitourinary Medical History: Negative for: End Stage Renal Disease Past Medical History Notes: Syphilis CKD,Nocturia Immunological Medical History: Negative for: Lupus Erythematosus; Raynauds; Scleroderma Past Medical History Notes: HIV Integumentary (Skin) Medical History: Negative for: History of Burn Musculoskeletal Medical History: Positive for: Gout; Osteoarthritis Negative for: Rheumatoid Arthritis; Osteomyelitis Past Medical  History Notes: Hip Fx BiLateral Bunions Unspecified Arthritis Neurologic Medical History: Positive for: Neuropathy Negative for: Dementia; Quadriplegia; Paraplegia; Seizure Disorder Past Medical History Notes: chronic back pain Oncologic Medical History: Negative for: Received Chemotherapy; Received Radiation Psychiatric Medical History: Negative for: Anorexia/bulimia; Confinement Anxiety HBO Extended History Items Eyes: Eyes: Cataracts Glaucoma Immunizations Pneumococcal Vaccine: Received Pneumococcal Vaccination: Yes Received Pneumococcal Vaccination On or After 60th Birthday: Yes Implantable Devices No devices added Hospitalization / Surgery History Type of Hospitalization/Surgery Colonoscopy CABG Laproscopic Cholecystectomy Left Hip Fx Left Jaw Fx Lumbar Laminectomy Family and Social History Cancer: No; Diabetes: Yes - Siblings,Father; Heart Disease: Yes - Father,Siblings; Hereditary Spherocytosis: No; Hypertension: Yes - Father,Siblings; Kidney Disease: No; Lung Disease: No; Seizures: No; Stroke: No; Thyroid Problems: No; Tuberculosis: No; Current every day smoker - 1/2 ppd / 55yr; Marital Status - Single; Alcohol Use: Moderate - 6 standard drinks per week; Drug Use: Prior History - Polysubstance Abuse; clean for 9 years; Caffeine Use: Rarely - tea; Financial Concerns: No; Food, Clothing or Shelter Needs: No; Support System Lacking: No; Transportation Concerns: No Electronic Signature(s) Signed: 05/10/2022 4:14:01 PM By: CFredirick MaudlinMD FACS Signed: 05/10/2022 5:31:58 PM By: BBaruch GoutyRN, BSN Entered By: CFredirick Maudlinon 05/10/2022 14:20:13 -------------------------------------------------------------------------------- SuperBill Details Patient Name: Date of Service: NRaechel Ache7/18/2023 Medical Record Number: [id number] Account Number: [id number] of Birth/Sex: Treating RN: [id number](71y.o. MErnestene MentionPrimary Care  Provider: MAlain HoneyOther Clinician: Referring Provider: Treating Provider/Extender: CNeil Crouchin Treatment: 10 Diagnosis Coding ICD-10 Codes Code Description [phone]-pressure chronic ulcer of other  part of right foot with fat layer exposed E11.22 Type 2 diabetes mellitus with diabetic chronic kidney disease J44.9 Chronic obstructive pulmonary disease, unspecified I50.42 Chronic combined systolic (congestive) and diastolic (congestive) heart failure I10 Essential (primary) hypertension N25.81 Secondary hyperparathyroidism of renal origin N18.6 End stage renal disease B20 Human immunodeficiency virus [HIV] disease Facility Procedures CPT4 Code: [id number] Description: 43329 - DEB SUBQ TISSUE 20 SQ CM/< ICD-10 Diagnosis Description L97.512 Non-pressure chronic ulcer of other part of right foot with fat layer exposed Modifier: Quantity: 1 Physician Procedures : CPT4 Code Description Modifier [id number] 60630 - WC PHYS LEVEL 3 - EST PT 25 ICD-10 Diagnosis Description L97.512 Non-pressure chronic ulcer of other part of right foot with fat layer exposed E11.22 Type 2 diabetes mellitus with diabetic chronic kidney  disease I50.42 Chronic combined systolic (congestive) and diastolic (congestive) heart failure N18.6 End stage renal disease Quantity: 1 : [id number] 11042 - WC PHYS SUBQ TISS 20 SQ CM ICD-10 Diagnosis Description L97.512 Non-pressure chronic ulcer of other part of right foot with fat layer exposed Quantity: 1 Electronic Signature(s) Signed: 05/10/2022 2:22:06 PM By: Fredirick Maudlin MD FACS Entered By: Fredirick Maudlin on 05/10/2022 14:22:06

## 2022-05-11 ENCOUNTER — Telehealth: Payer: Self-pay

## 2022-05-11 ENCOUNTER — Inpatient Hospital Stay (HOSPITAL_COMMUNITY): Admission: RE | Admit: 2022-05-11 | Payer: Medicare Other | Source: Ambulatory Visit

## 2022-05-11 ENCOUNTER — Other Ambulatory Visit: Payer: Self-pay

## 2022-05-11 ENCOUNTER — Other Ambulatory Visit: Payer: Self-pay | Admitting: *Deleted

## 2022-05-11 DIAGNOSIS — D631 Anemia in chronic kidney disease: Secondary | ICD-10-CM | POA: Diagnosis not present

## 2022-05-11 DIAGNOSIS — E8779 Other fluid overload: Secondary | ICD-10-CM | POA: Diagnosis not present

## 2022-05-11 DIAGNOSIS — M109 Gout, unspecified: Secondary | ICD-10-CM

## 2022-05-11 DIAGNOSIS — N186 End stage renal disease: Secondary | ICD-10-CM | POA: Diagnosis not present

## 2022-05-11 DIAGNOSIS — E1122 Type 2 diabetes mellitus with diabetic chronic kidney disease: Secondary | ICD-10-CM | POA: Diagnosis not present

## 2022-05-11 DIAGNOSIS — N2581 Secondary hyperparathyroidism of renal origin: Secondary | ICD-10-CM | POA: Diagnosis not present

## 2022-05-11 DIAGNOSIS — I5042 Chronic combined systolic (congestive) and diastolic (congestive) heart failure: Secondary | ICD-10-CM

## 2022-05-11 DIAGNOSIS — Z992 Dependence on renal dialysis: Secondary | ICD-10-CM | POA: Diagnosis not present

## 2022-05-11 DIAGNOSIS — T8249XA Other complication of vascular dialysis catheter, initial encounter: Secondary | ICD-10-CM | POA: Diagnosis not present

## 2022-05-11 MED ORDER — GABAPENTIN 300 MG PO CAPS: ORAL_CAPSULE | ORAL | 2 refills | Status: DC

## 2022-05-11 MED ORDER — ALLOPURINOL 100 MG PO TABS: ORAL_TABLET | ORAL | 2 refills | Status: DC

## 2022-05-11 MED ORDER — FOLIC ACID 1 MG PO TABS: ORAL_TABLET | ORAL | 2 refills | Status: DC

## 2022-05-11 MED ORDER — ISOSORBIDE MONONITRATE ER 30 MG PO TB24: 30.0000 mg | ORAL_TABLET | Freq: Every day | ORAL | 2 refills | Status: DC

## 2022-05-11 NOTE — Addendum Note (Signed)
Addended by: Rafael Bihari A on: 05/11/2022 09:00 AM   Modules accepted: Orders

## 2022-05-11 NOTE — Addendum Note (Signed)
Addended by: Rafael Bihari A on: 05/11/2022 09:06 AM   Modules accepted: Orders

## 2022-05-11 NOTE — Telephone Encounter (Signed)
Pharmacy requested refill

## 2022-05-11 NOTE — Telephone Encounter (Signed)
This encounter was created in error - please disregard.

## 2022-05-11 NOTE — Telephone Encounter (Signed)
Called pt asking if he was on his way, since it was close to being too late to get [place]. Pt stated that he had an appt at his house and he wouldn't be able to come in today. He stated that Dr Posey Pronto told him it would be ok to wait until his original appt time of 1230 on 7/20.  Placed pt on hold while speaking with PA. PA stated that the pt can have his access cannulated at [facility] tomorrow and does not need to have an [place] or provider visit. Relayed this information to pt and to [facility] center. Russelle requested a faxed copy of this note as proof of verbal ok. Confirmed understanding.

## 2022-05-11 NOTE — Telephone Encounter (Signed)
RN with [name] called asking if this pt could be seen today instead of tomorrow. The pt's TDC was given cath flow and he got less than 1 hr of treatment before the catheter fell out. Dr Posey Pronto assessed and wanted the ok to use the AVG. Worked pt in to get [place] and provider visits asap. Confirmed understanding.

## 2022-05-12 DIAGNOSIS — Z992 Dependence on renal dialysis: Secondary | ICD-10-CM | POA: Diagnosis not present

## 2022-05-12 DIAGNOSIS — N2581 Secondary hyperparathyroidism of renal origin: Secondary | ICD-10-CM | POA: Diagnosis not present

## 2022-05-12 DIAGNOSIS — D631 Anemia in chronic kidney disease: Secondary | ICD-10-CM | POA: Diagnosis not present

## 2022-05-12 DIAGNOSIS — E8779 Other fluid overload: Secondary | ICD-10-CM | POA: Diagnosis not present

## 2022-05-12 DIAGNOSIS — T8249XA Other complication of vascular dialysis catheter, initial encounter: Secondary | ICD-10-CM | POA: Diagnosis not present

## 2022-05-12 DIAGNOSIS — E1122 Type 2 diabetes mellitus with diabetic chronic kidney disease: Secondary | ICD-10-CM | POA: Diagnosis not present

## 2022-05-12 DIAGNOSIS — N186 End stage renal disease: Secondary | ICD-10-CM | POA: Diagnosis not present

## 2022-05-13 DIAGNOSIS — N2581 Secondary hyperparathyroidism of renal origin: Secondary | ICD-10-CM | POA: Diagnosis not present

## 2022-05-13 DIAGNOSIS — N186 End stage renal disease: Secondary | ICD-10-CM | POA: Diagnosis not present

## 2022-05-13 DIAGNOSIS — E8779 Other fluid overload: Secondary | ICD-10-CM | POA: Diagnosis not present

## 2022-05-13 DIAGNOSIS — T8249XA Other complication of vascular dialysis catheter, initial encounter: Secondary | ICD-10-CM | POA: Diagnosis not present

## 2022-05-13 DIAGNOSIS — E1122 Type 2 diabetes mellitus with diabetic chronic kidney disease: Secondary | ICD-10-CM | POA: Diagnosis not present

## 2022-05-13 DIAGNOSIS — Z992 Dependence on renal dialysis: Secondary | ICD-10-CM | POA: Diagnosis not present

## 2022-05-13 DIAGNOSIS — D631 Anemia in chronic kidney disease: Secondary | ICD-10-CM | POA: Diagnosis not present

## 2022-05-16 ENCOUNTER — Other Ambulatory Visit: Payer: Self-pay | Admitting: *Deleted

## 2022-05-16 ENCOUNTER — Telehealth: Payer: Self-pay

## 2022-05-16 DIAGNOSIS — N186 End stage renal disease: Secondary | ICD-10-CM | POA: Diagnosis not present

## 2022-05-16 DIAGNOSIS — E8779 Other fluid overload: Secondary | ICD-10-CM | POA: Diagnosis not present

## 2022-05-16 DIAGNOSIS — Z992 Dependence on renal dialysis: Secondary | ICD-10-CM | POA: Diagnosis not present

## 2022-05-16 DIAGNOSIS — N2581 Secondary hyperparathyroidism of renal origin: Secondary | ICD-10-CM | POA: Diagnosis not present

## 2022-05-16 DIAGNOSIS — E1122 Type 2 diabetes mellitus with diabetic chronic kidney disease: Secondary | ICD-10-CM | POA: Diagnosis not present

## 2022-05-16 DIAGNOSIS — D631 Anemia in chronic kidney disease: Secondary | ICD-10-CM | POA: Diagnosis not present

## 2022-05-16 DIAGNOSIS — T8249XA Other complication of vascular dialysis catheter, initial encounter: Secondary | ICD-10-CM | POA: Diagnosis not present

## 2022-05-16 NOTE — Telephone Encounter (Signed)
Per Mr.Digiulio rx should be every 4 hours versus every 6 hours.   Patient states Dr.Miller needs to update instructions prior to send in

## 2022-05-16 NOTE — Telephone Encounter (Signed)
Patient requested refill.  Epic LR: 05/02/2022

## 2022-05-16 NOTE — Telephone Encounter (Signed)
Pt called stating that "when the catheter came out of hole, he was told it would be ok".  Reviewed pt's chart, returned pt's call, two identifiers used. Pt stated that both his hands were cold and asked if that had anything to do with the [facility] falling out. Reassured pt and instructed him to monitor for any infection symptoms from the old catheter site, as he doesn't have any at this time. He stated that he was being seen in the wound care clinic and he would have them look at it. He will also have the doctor at the HD center look at it when he goes back on Wed. Confirmed understanding.

## 2022-05-17 ENCOUNTER — Encounter (HOSPITAL_BASED_OUTPATIENT_CLINIC_OR_DEPARTMENT_OTHER): Payer: Medicare Other | Admitting: General Surgery

## 2022-05-17 DIAGNOSIS — E11621 Type 2 diabetes mellitus with foot ulcer: Secondary | ICD-10-CM | POA: Diagnosis not present

## 2022-05-17 DIAGNOSIS — E1122 Type 2 diabetes mellitus with diabetic chronic kidney disease: Secondary | ICD-10-CM | POA: Diagnosis not present

## 2022-05-17 DIAGNOSIS — L97512 Non-pressure chronic ulcer of other part of right foot with fat layer exposed: Secondary | ICD-10-CM | POA: Diagnosis not present

## 2022-05-17 DIAGNOSIS — I132 Hypertensive heart and chronic kidney disease with heart failure and with stage 5 chronic kidney disease, or end stage renal disease: Secondary | ICD-10-CM | POA: Diagnosis not present

## 2022-05-17 DIAGNOSIS — J449 Chronic obstructive pulmonary disease, unspecified: Secondary | ICD-10-CM | POA: Diagnosis not present

## 2022-05-17 DIAGNOSIS — N186 End stage renal disease: Secondary | ICD-10-CM | POA: Diagnosis not present

## 2022-05-17 DIAGNOSIS — I5042 Chronic combined systolic (congestive) and diastolic (congestive) heart failure: Secondary | ICD-10-CM | POA: Diagnosis not present

## 2022-05-17 MED ORDER — OXYCODONE-ACETAMINOPHEN 5-325 MG PO TABS: 1.0000 | ORAL_TABLET | Freq: Four times a day (QID) | ORAL | 0 refills | Status: DC | PRN

## 2022-05-17 NOTE — Progress Notes (Signed)
DAMIER, DISANO ([id number]) Visit Report for 05/17/2022 Arrival Information Details Patient Name: Date of Service: Jacob [name], Jacob [name] 05/17/2022 1:15 PM Medical Record Number: [id number] Patient Account Number: [id number] Date of Birth/Sex: Treating RN: 01/01/1951 (71 y.o. [name] Primary Care Alica Shellhammer: Alain Honey Other Clinician: Referring Dayvon Dax: Treating Cadience Bradfield/Extender: Neil Crouch in Treatment: 11 Visit Information History Since Last Visit All ordered tests and consults were completed: Yes Patient Arrived: Cane Added or deleted any medications: No Arrival Time: 13:19 Any new allergies or adverse reactions: No Accompanied By: self Had a fall or experienced change in No Transfer Assistance: None activities of daily living that may affect Patient Requires Transmission-Based Precautions: No risk of falls: Patient Has Alerts: No Signs or symptoms of abuse/neglect since last visito No Hospitalized since last visit: No Implantable device outside of the clinic excluding No cellular tissue based products placed in the center since last visit: Has Dressing in Place as Prescribed: Yes Has Compression in Place as Prescribed: No Pain Present Now: No Electronic Signature(s) Signed: 05/17/2022 1:54:01 PM By: Blanche East RN Entered By: Blanche East on 05/17/2022 13:21:43 -------------------------------------------------------------------------------- Lower Extremity Assessment Details Patient Name: Date of Service: Jacob [name] 05/17/2022 1:15 PM Medical Record Number: [id number] Patient Account Number: [id number] Date of Birth/Sex: Treating RN: [birth year] (71 y.o. [name] Primary Care Leith Szafranski: Alain Honey Other Clinician: Referring Safiatou Islam: Treating Siara Gorder/Extender: Neil Crouch in Treatment: 11 Edema Assessment Assessed: [name]: No] Patrice Paradise: No] Edema: [Left: N] [Right:  o] Calf Left: Right: Point of Measurement: From Medial Instep 32.6 cm Ankle Left: Right: Point of Measurement: From Medial Instep 20 cm Vascular Assessment Pulses: Dorsalis Pedis Palpable: [Right:Yes] Electronic Signature(s) Signed: 05/17/2022 1:54:01 PM By: Blanche East RN Signed: 05/17/2022 4:42:49 PM By: Baruch Gouty RN, BSN Entered By: Blanche East on 05/17/2022 13:29:36 -------------------------------------------------------------------------------- Multi Wound Chart Details Patient Name: Date of Service: Jacob Ache. 05/17/2022 1:15 PM Medical Record Number: [id number] Patient Account Number: [id number] Date of Birth/Sex: Treating RN: 1951-02-15 (71 y.o. Ulyses Amor, [name] Primary Care Tarvaris Puglia: Alain Honey Other Clinician: Referring Ishaan Villamar: Treating Silva Aamodt/Extender: Neil Crouch in Treatment: 11 Vital Signs Height(in): 74 Capillary Blood Glucose(mg/dl): 230 Weight(lbs): 172 Pulse(bpm): 23 Body Mass Index(BMI): 22.1 Blood Pressure(mmHg): 120/69 Temperature(F): 97.8 Respiratory Rate(breaths/min): 18 Photos: [N/A:N/A] Right, Plantar Foot N/A N/A Wound Location: Blister N/A N/A Wounding Event: Diabetic Wound/Ulcer of the Lower N/A N/A Primary Etiology: Extremity Cataracts, Glaucoma, Anemia, Chronic N/A N/A Comorbid History: Obstructive Pulmonary Disease (COPD), Congestive Heart Failure, Coronary Artery Disease, Hypertension, Myocardial Infarction, Hepatitis C, Type II Diabetes, Gout, Osteoarthritis, Neuropathy 03/08/2022 N/A N/A Date Acquired: 10 N/A N/A Weeks of Treatment: Open N/A N/A Wound Status: No N/A N/A Wound Recurrence: 0.4x0.4x0.2 N/A N/A Measurements L x W x D (cm) 0.126 N/A N/A A (cm) : rea 0.025 N/A N/A Volume (cm) : 88.50% N/A N/A % Reduction in A rea: 92.40% N/A N/A % Reduction in Volume: Grade 2 N/A N/A Classification: Small N/A N/A Exudate A mount: Serosanguineous N/A  N/A Exudate Type: red, brown N/A N/A Exudate Color: Thickened N/A N/A Wound Margin: Medium (34-66%) N/A N/A Granulation A mount: Pink N/A N/A Granulation Quality: Medium (34-66%) N/A N/A Necrotic A mount: Fat Layer (Subcutaneous Tissue): Yes N/A N/A Exposed Structures: Fascia: No Tendon: No Muscle: No Joint: No Bone: No Small (1-33%) N/A N/A Epithelialization: Debridement - Excisional N/A N/A Debridement: 13:46 N/A N/A Pre-procedure Verification/Time Out Taken: Callus,  Subcutaneous, Slough N/A N/A Tissue Debrided: Skin/Subcutaneous Tissue N/A N/A Level: 4 N/A N/A Debridement A (sq cm): rea Curette N/A N/A Instrument: Minimum N/A N/A Bleeding: Pressure N/A N/A Hemostasis A chieved: 0 N/A N/A Procedural Pain: 0 N/A N/A Post Procedural Pain: Procedure was tolerated well N/A N/A Debridement Treatment Response: 0.4x0.4x0.2 N/A N/A Post Debridement Measurements L x W x D (cm) 0.025 N/A N/A Post Debridement Volume: (cm) Debridement N/A N/A Procedures Performed: Treatment Notes Electronic Signature(s) Signed: 05/17/2022 1:55:07 PM By: Fredirick Maudlin MD FACS Signed: 05/17/2022 4:42:49 PM By: Baruch Gouty RN, BSN Previous Signature: 05/17/2022 1:54:01 PM Version By: Blanche East RN Entered By: Fredirick Maudlin on 05/17/2022 13:55:06 -------------------------------------------------------------------------------- Multi-Disciplinary Care Plan Details Patient Name: Date of Service: Jacob Ache. 05/17/2022 1:15 PM Medical Record Number: [id number] Patient Account Number: [id number] Date of Birth/Sex: Treating RN: 1951-08-12 (71 y.o. [name] Primary Care Tyauna Lacaze: Alain Honey Other Clinician: Referring Seaborn Nakama: Treating Sowmya Partridge/Extender: Neil Crouch in Treatment: [facility] reviewed with physician Active Inactive Nutrition Nursing Diagnoses: Impaired glucose control: actual or  potential Potential for alteratiion in Nutrition/Potential for imbalanced nutrition Goals: Patient/caregiver will maintain therapeutic glucose control Date Initiated: 05/10/2022 Target Resolution Date: 06/14/2022 Goal Status: Active Interventions: Assess HgA1c results as ordered upon admission and as needed Provide education on elevated blood sugars and impact on wound healing Treatment Activities: Dietary management education, guidance and counseling : 05/10/2022 Patient referred to Primary Care Physician for further nutritional evaluation : 05/10/2022 Notes: Wound/Skin Impairment Nursing Diagnoses: Impaired tissue integrity Goals: Patient/caregiver will verbalize understanding of skin care regimen Date Initiated: 03/01/2022 Target Resolution Date: 06/14/2022 Goal Status: Active Interventions: Assess ulceration(s) every visit Treatment Activities: Skin care regimen initiated : 03/01/2022 Notes: Electronic Signature(s) Signed: 05/17/2022 1:54:01 PM By: Blanche East RN Signed: 05/17/2022 4:42:49 PM By: Baruch Gouty RN, BSN Entered By: Blanche East on 05/17/2022 13:51:54 -------------------------------------------------------------------------------- Pain Assessment Details Patient Name: Date of Service: Jacob Ache. 05/17/2022 1:15 PM Medical Record Number: [id number] Patient Account Number: [id number] Date of Birth/Sex: Treating RN: 1951/01/31 (70 y.o. [name] Primary Care Nikeisha Klutz: Alain Honey Other Clinician: Referring Nicklos Gaxiola: Treating Afsa Meany/Extender: Neil Crouch in Treatment: 11 Active Problems Location of Pain Severity and Description of Pain Patient Has Paino No Site Locations Rate the pain. Current Pain Level: 0 Pain Management and Medication Current Pain Management: Electronic Signature(s) Signed: 05/17/2022 1:54:01 PM By: Blanche East RN Signed: 05/17/2022 4:42:49 PM By: Baruch Gouty RN, BSN Entered By:  Blanche East on 05/17/2022 13:23:13 -------------------------------------------------------------------------------- Patient/Caregiver Education Details Patient Name: Date of Service: Jacob Ache 7/25/2023andnbsp1:15 PM Medical Record Number: [id number] Patient Account Number: [id number] Date of Birth/Gender: Treating RN: 1951-04-16 (71 y.o. [name] Primary Care Physician: Alain Honey Other Clinician: Referring Physician: Treating Physician/Extender: Neil Crouch in Treatment: 11 Education Assessment Education Provided To: Patient Education Topics Provided Elevated Blood Sugar/ Impact on Healing: Methods: Explain/Verbal Responses: Reinforcements needed, State content correctly Electronic Signature(s) Signed: 05/17/2022 1:54:01 PM By: Blanche East RN Entered By: Blanche East on 05/17/2022 13:52:17 -------------------------------------------------------------------------------- Wound Assessment Details Patient Name: Date of Service: Jacob Ache. 05/17/2022 1:15 PM Medical Record Number: [id number] Patient Account Number: [id number] Date of Birth/Sex: Treating RN: 1951/03/25 (71 y.o. [name] Primary Care Shequilla Goodgame: Alain Honey Other Clinician: Referring Tej Murdaugh: Treating Willye Javier/Extender: Neil Crouch in Treatment: 11 Wound Status Wound Number: 8 Primary Diabetic Wound/Ulcer of  the Lower Extremity Etiology: Wound Location: Right, Plantar Foot Wound Open Wounding Event: Blister Status: Date Acquired: 03/08/2022 Comorbid Cataracts, Glaucoma, Anemia, Chronic Obstructive Pulmonary Weeks Of Treatment: 10 History: Disease (COPD), Congestive Heart Failure, Coronary Artery Clustered Wound: No Disease, Hypertension, Myocardial Infarction, Hepatitis C, Type II Diabetes, Gout, Osteoarthritis, Neuropathy Photos Wound Measurements Length: (cm) 0.4 Width: (cm) 0.4 Depth: (cm)  0.2 Area: (cm) 0.126 Volume: (cm) 0.025 % Reduction in Area: 88.5% % Reduction in Volume: 92.4% Epithelialization: Small (1-33%) Tunneling: No Undermining: No Wound Description Classification: Grade 2 Wound Margin: Thickened Exudate Amount: Small Exudate Type: Serosanguineous Exudate Color: red, brown Foul Odor After Cleansing: No Slough/Fibrino Yes Wound Bed Granulation Amount: Medium (34-66%) Exposed Structure Granulation Quality: Pink Fascia Exposed: No Necrotic Amount: Medium (34-66%) Fat Layer (Subcutaneous Tissue) Exposed: Yes Necrotic Quality: Adherent Slough Tendon Exposed: No Muscle Exposed: No Joint Exposed: No Bone Exposed: No Electronic Signature(s) Signed: 05/17/2022 1:54:01 PM By: Blanche East RN Signed: 05/17/2022 4:42:49 PM By: Baruch Gouty RN, BSN Entered By: Blanche East on 05/17/2022 13:34:51 -------------------------------------------------------------------------------- Vitals Details Patient Name: Date of Service: Jacob Ache. 05/17/2022 1:15 PM Medical Record Number: [id number] Patient Account Number: [id number] Date of Birth/Sex: Treating RN: June 11, 1951 (71 y.o. [name] Primary Care Chasiti Waddington: Alain Honey Other Clinician: Referring Daltyn Degroat: Treating Lennon Richins/Extender: Neil Crouch in Treatment: 11 Vital Signs Time Taken: 13:21 Temperature (F): 97.8 Height (in): 74 Pulse (bpm): 79 Weight (lbs): 172 Respiratory Rate (breaths/min): 18 Body Mass Index (BMI): 22.1 Blood Pressure (mmHg): 120/69 Capillary Blood Glucose (mg/dl): 230 Reference Range: 80 - 120 mg / dl Electronic Signature(s) Signed: 05/17/2022 1:54:01 PM By: Blanche East RN Entered By: Blanche East on 05/17/2022 13:22:59

## 2022-05-18 ENCOUNTER — Other Ambulatory Visit: Payer: Self-pay | Admitting: Nurse Practitioner

## 2022-05-18 DIAGNOSIS — E8779 Other fluid overload: Secondary | ICD-10-CM | POA: Diagnosis not present

## 2022-05-18 DIAGNOSIS — T8249XA Other complication of vascular dialysis catheter, initial encounter: Secondary | ICD-10-CM | POA: Diagnosis not present

## 2022-05-18 DIAGNOSIS — Z992 Dependence on renal dialysis: Secondary | ICD-10-CM | POA: Diagnosis not present

## 2022-05-18 DIAGNOSIS — J449 Chronic obstructive pulmonary disease, unspecified: Secondary | ICD-10-CM

## 2022-05-18 DIAGNOSIS — N186 End stage renal disease: Secondary | ICD-10-CM | POA: Diagnosis not present

## 2022-05-18 DIAGNOSIS — D631 Anemia in chronic kidney disease: Secondary | ICD-10-CM | POA: Diagnosis not present

## 2022-05-18 DIAGNOSIS — N2581 Secondary hyperparathyroidism of renal origin: Secondary | ICD-10-CM | POA: Diagnosis not present

## 2022-05-18 DIAGNOSIS — E1122 Type 2 diabetes mellitus with diabetic chronic kidney disease: Secondary | ICD-10-CM | POA: Diagnosis not present

## 2022-05-18 NOTE — Progress Notes (Signed)
Jacob [name], Jacob [name] ([id number]) Visit Report for 05/10/2022 Arrival Information Details Patient Name: Date of Service: Jacob [name], Jacob [name] 05/10/2022 1:15 PM Medical Record Number: [id number] Patient Account Number: [id number] Date of Birth/Sex: Treating RN: 09/21/1951 (71 y.o. Jacob [name] Primary Care Jacob [name]: Jacob [name] Other Clinician: Donavan [name] Referring Jacob [name]: Treating Jacob [name]/Extender: Jacob [name] in Treatment: 10 Visit Information History Since Last Visit All ordered tests and consults were completed: Yes Patient Arrived: Ambulatory Added or deleted any medications: No Arrival Time: 13:43 Any new allergies or adverse reactions: No Accompanied By: self Had a fall or experienced change in No Transfer Assistance: None activities of daily living that may affect Patient Identification Verified: Yes risk of falls: Secondary Verification Process Completed: Yes Signs or symptoms of abuse/neglect since last visito No Patient Requires Transmission-Based Precautions: No Hospitalized since last visit: No Patient Has Alerts: No Implantable device outside of the clinic excluding No cellular tissue based products placed in the center since last visit: Pain Present Now: No Electronic Signature(s) Signed: 05/18/2022 5:39:28 PM By: Jacob [name] CHT EMT BS , , Entered By: Jacob [name] on 05/10/2022 13:44:33 -------------------------------------------------------------------------------- Encounter Discharge Information Details Patient Name: Date of Service: Jacob Ache. 05/10/2022 1:15 PM Medical Record Number: [id number] Patient Account Number: [id number] Date of Birth/Sex: Treating RN: [birth year] (71 y.o. Jacob [name] Primary Care Jacob [name]: Jacob [name] Other Clinician: Referring Jacob [name]: Treating Jacob [name]/Extender: Jacob [name] in Treatment: 10 Encounter Discharge Information Items  Post Procedure Vitals Discharge Condition: Stable Temperature (F): 97.9 Ambulatory Status: Cane Pulse (bpm): 71 Discharge Destination: Home Respiratory Rate (breaths/min): 18 Transportation: Private Auto Blood Pressure (mmHg): 127/59 Accompanied By: self Schedule Follow-up Appointment: Yes Clinical Summary of Care: Patient Declined Electronic Signature(s) Signed: 05/10/2022 5:31:58 PM By: Jacob Gouty RN, BSN Entered By: Jacob [name] on 05/10/2022 15:57:51 -------------------------------------------------------------------------------- Lower Extremity Assessment Details Patient Name: Date of Service: Jacob Ache. 05/10/2022 1:15 PM Medical Record Number: [id number] Patient Account Number: [id number] Date of Birth/Sex: Treating RN: [birth year] (71 y.o. Jacob [name] Primary Care Jacob [name]: Jacob [name] Other Clinician: Donavan [name] Referring Emmalee [name]: Treating Jacob [name]/Extender: Jacob [name] in Treatment: 10 Edema Assessment Assessed: Jacob [name]: No] Jacob [name]: No] Edema: [Left: N] [Right: o] Calf Left: Right: Point of Measurement: From Medial Instep 32.2 cm Ankle Left: Right: Point of Measurement: From Medial Instep 23.9 cm Vascular Assessment Pulses: Dorsalis Pedis Palpable: [Right:Yes] Electronic Signature(s) Signed: 05/10/2022 5:31:58 PM By: Jacob Gouty RN, BSN Entered By: Jacob [name] on 05/10/2022 13:58:33 -------------------------------------------------------------------------------- Multi Wound Chart Details Patient Name: Date of Service: Jacob Ache. 05/10/2022 1:15 PM Medical Record Number: [id number] Patient Account Number: [id number] Date of Birth/Sex: Treating RN: October 02, 1951 (71 y.o. Jacob [name] Primary Care Jacob [name]: Jacob [name] Other Clinician: Referring Jacob [name]: Treating Jacob [name]/Extender: Jacob [name] in Treatment: 10 Vital Signs Height(in):  74 Capillary Blood Glucose(mg/dl): 285 Weight(lbs): 172 Pulse(bpm): 54 Body Mass Index(BMI): 22.1 Blood Pressure(mmHg): 127/59 Temperature(F): 97.9 Respiratory Rate(breaths/min): 16 Photos: [8:Right, Plantar Foot] [N/A:N/A N/A] Wound Location: [8:Blister] [N/A:N/A] Wounding Event: [8:Diabetic Wound/Ulcer of the Lower] [N/A:N/A] Primary Etiology: [8:Extremity Cataracts, Glaucoma, Anemia, Chronic N/A] Comorbid History: [8:Obstructive Pulmonary Disease (COPD), Congestive Heart Failure, Coronary Artery Disease, Hypertension, Myocardial Infarction, Hepatitis C, Type II Diabetes, Gout, Osteoarthritis, Neuropathy 03/08/2022] [N/A:N/A] Date Acquired: [8:9] [N/A:N/A] Weeks of Treatment: [8:Open] [N/A:N/A] Wound Status: [8:No] [N/A:N/A] Wound Recurrence: [8:0.2x0.1x0.4] [N/A:N/A] Measurements L x W x D (cm) [8:0.016] [N/A:N/A] A (cm) :  rea [8:0.006] [N/A:N/A] Volume (cm) : [8:98.50%] [N/A:N/A] % Reduction in A [8:rea: 98.20%] [N/A:N/A] % Reduction in Volume: [8:12] Starting Position 1 (o'clock): [8:12] Ending Position 1 (o'clock): [8:0.3] Maximum Distance 1 (cm): [8:Yes] [N/A:N/A] Undermining: [8:Grade 2] [N/A:N/A] Classification: [8:Small] [N/A:N/A] Exudate A mount: [8:Serosanguineous] [N/A:N/A] Exudate Type: [8:red, brown] [N/A:N/A] Exudate Color: [8:Thickened] [N/A:N/A] Wound Margin: [8:None Present (0%)] [N/A:N/A] Granulation A mount: [8:None Present (0%)] [N/A:N/A] Necrotic A mount: [8:Fat Layer (Subcutaneous Tissue): Yes N/A] Exposed Structures: [8:Fascia: No Tendon: No Muscle: No Joint: No Bone: No Small (1-33%)] [N/A:N/A] Epithelialization: [8:Debridement - Excisional] [N/A:N/A] Debridement: Pre-procedure Verification/Time Out 14:05 [N/A:N/A] Taken: [8:Callus, Subcutaneous] [N/A:N/A] Tissue Debrided: [8:Skin/Subcutaneous Tissue] [N/A:N/A] Level: [8:0.64] [N/A:N/A] Debridement A (sq cm): [8:rea Curette] [N/A:N/A] Instrument: [8:Minimum] [N/A:N/A] Bleeding: [8:Pressure]  [N/A:N/A] Hemostasis A chieved: [8:0] [N/A:N/A] Procedural Pain: [8:0] [N/A:N/A] Post Procedural Pain: [8:Procedure was tolerated well] [N/A:N/A] Debridement Treatment Response: [8:0.8x0.8x0.3] [N/A:N/A] Post Debridement Measurements L x W x D (cm) [8:0.151] [N/A:N/A] Post Debridement Volume: (cm) [8:unable to visualize wound bed due to N/A] Assessment Notes: [8:small opening Debridement] [N/A:N/A] Treatment Notes Electronic Signature(s) Signed: 05/10/2022 2:19:00 PM By: Fredirick Maudlin MD FACS Signed: 05/10/2022 5:31:58 PM By: Jacob Gouty RN, BSN Entered By: Fredirick Maudlin on 05/10/2022 14:19:00 -------------------------------------------------------------------------------- Multi-Disciplinary Care Plan Details Patient Name: Date of Service: Jacob Ache. 05/10/2022 1:15 PM Medical Record Number: [id number] Patient Account Number: [id number] Date of Birth/Sex: Treating RN: Apr 19, 1951 (71 y.o. Jacob [name] Primary Care Huda Petrey: Jacob [name] Other Clinician: Referring Destenee Guerry: Treating Clea Dubach/Extender: Jacob [name] in Treatment: Keith reviewed with physician Active Inactive Nutrition Nursing Diagnoses: Impaired glucose control: actual or potential Potential for alteratiion in Nutrition/Potential for imbalanced nutrition Goals: Patient/caregiver will maintain therapeutic glucose control Date Initiated: 05/10/2022 Target Resolution Date: 05/20/2022 Goal Status: Active Interventions: Assess HgA1c results as ordered upon admission and as needed Provide education on elevated blood sugars and impact on wound healing Treatment Activities: Dietary management education, guidance and counseling : 05/10/2022 Patient referred to Primary Care Physician for further nutritional evaluation : 05/10/2022 Notes: Wound/Skin Impairment Nursing Diagnoses: Impaired tissue integrity Goals: Patient/caregiver will verbalize  understanding of skin care regimen Date Initiated: 03/01/2022 Target Resolution Date: 05/20/2022 Goal Status: Active Interventions: Assess ulceration(s) every visit Treatment Activities: Skin care regimen initiated : 03/01/2022 Notes: Electronic Signature(s) Signed: 05/10/2022 5:31:58 PM By: Jacob Gouty RN, BSN Entered By: Jacob [name] on 05/10/2022 14:02:21 -------------------------------------------------------------------------------- Pain Assessment Details Patient Name: Date of Service: Jacob Ache. 05/10/2022 1:15 PM Medical Record Number: [id number] Patient Account Number: [id number] Date of Birth/Sex: Treating RN: 1951/03/16 (71 y.o. Jacob [name] Primary Care Treylen Gibbs: Jacob [name] Other Clinician: Referring Emre Stock: Treating Dresden Lozito/Extender: Jacob [name] in Treatment: 10 Active Problems Location of Pain Severity and Description of Pain Patient Has Paino No Site Locations Rate the pain. Rate the pain. Current Pain Level: 0 Pain Management and Medication Current Pain Management: Electronic Signature(s) Signed: 05/10/2022 5:31:58 PM By: Jacob Gouty RN, BSN Entered By: Jacob [name] on 05/10/2022 13:57:51 -------------------------------------------------------------------------------- Patient/Caregiver Education Details Patient Name: Date of Service: Jacob Ache 7/18/2023andnbsp1:15 PM Medical Record Number: [id number] Patient Account Number: [id number] Date of Birth/Gender: Treating RN: November 29, 1950 (71 y.o. Jacob [name] Primary Care Physician: Jacob [name] Other Clinician: Referring Physician: Treating Physician/Extender: Jacob [name] in Treatment: 10 Education Assessment Education Provided To: Patient Education Topics Provided Elevated Blood Sugar/ Impact on Healing: Methods: Explain/Verbal Responses: Reinforcements needed, State content  correctly  Offloading: Methods: Explain/Verbal Responses: Reinforcements needed, State content correctly Wound/Skin Impairment: Methods: Explain/Verbal Responses: Reinforcements needed, State content correctly Electronic Signature(s) Signed: 05/10/2022 5:31:58 PM By: Jacob Gouty RN, BSN Entered By: Jacob [name] on 05/10/2022 14:02:52 -------------------------------------------------------------------------------- Wound Assessment Details Patient Name: Date of Service: Jacob Ache. 05/10/2022 1:15 PM Medical Record Number: [id number] Patient Account Number: [id number] Date of Birth/Sex: Treating RN: 1950-11-14 (71 y.o. Jacob [name] Primary Care Kerry-Anne Mezo: Jacob [name] Other Clinician: Referring Mckynlee Luse: Treating Vivianne Carles/Extender: Jacob [name] in Treatment: 10 Wound Status Wound Number: 8 Primary Diabetic Wound/Ulcer of the Lower Extremity Etiology: Wound Location: Right, Plantar Foot Wound Open Wounding Event: Blister Status: Date Acquired: 03/08/2022 Comorbid Cataracts, Glaucoma, Anemia, Chronic Obstructive Pulmonary Weeks Of Treatment: 9 History: Disease (COPD), Congestive Heart Failure, Coronary Artery Clustered Wound: No Disease, Hypertension, Myocardial Infarction, Hepatitis C, Type II Diabetes, Gout, Osteoarthritis, Neuropathy Photos Wound Measurements Length: (cm) 0.2 Width: (cm) 0.1 Depth: (cm) 0.4 Area: (cm) 0.016 Volume: (cm) 0.006 % Reduction in Area: 98.5% % Reduction in Volume: 98.2% Epithelialization: Small (1-33%) Tunneling: No Undermining: Yes Starting Position (o'clock): 12 Ending Position (o'clock): 12 Maximum Distance: (cm) 0.3 Wound Description Classification: Grade 2 Wound Margin: Thickened Exudate Amount: Small Exudate Type: Serosanguineous Exudate Color: red, brown Foul Odor After Cleansing: No Slough/Fibrino Yes Wound Bed Granulation Amount: None Present (0%) Exposed Structure Necrotic  Amount: None Present (0%) Fascia Exposed: No Fat Layer (Subcutaneous Tissue) Exposed: Yes Tendon Exposed: No Muscle Exposed: No Joint Exposed: No Bone Exposed: No Assessment Notes unable to visualize wound bed due to small opening Electronic Signature(s) Signed: 05/10/2022 5:31:58 PM By: Jacob Gouty RN, BSN Entered By: Jacob [name] on 05/10/2022 14:00:02 -------------------------------------------------------------------------------- [place] Details Patient Name: Date of Service: Jacob Ache. 05/10/2022 1:15 PM Medical Record Number: [id number] Patient Account Number: [id number] Date of Birth/Sex: Treating RN: [birth year] (71 y.o. Jacob [name] Primary Care Malissa Slay: Jacob [name] Other Clinician: Donavan [name] Referring Starnisha Batrez: Treating Dominique Ressel/Extender: Jacob [name] in Treatment: 10 Vital Signs Time Taken: 13:44 Temperature (F): 97.9 Height (in): 74 Pulse (bpm): 71 Weight (lbs): 172 Respiratory Rate (breaths/min): 16 Body Mass Index (BMI): 22.1 Blood Pressure (mmHg): 127/59 Capillary Blood Glucose (mg/dl): 285 Reference Range: 80 - 120 mg / dl Notes glucose per pt report this am Electronic Signature(s) Signed: 05/10/2022 5:31:58 PM By: Jacob Gouty RN, BSN Entered By: Jacob [name] on 05/10/2022 13:57:35

## 2022-05-18 NOTE — Progress Notes (Signed)
Jacob [name] ([id number]) Visit Report for 05/17/2022 Chief Complaint Document Details Patient Name: Date of Service: Jacob [name] 05/17/2022 1:15 PM Medical Record Number: [id number] Patient Account Number: [id number] Date of Birth/Sex: Treating RN: 1951/08/16 (71 y.o. Jacob [name] Primary Care Provider: Alain Honey Other Clinician: Referring Provider: Treating Provider/Extender: Neil Crouch in Treatment: 11 Information Obtained from: Patient Chief Complaint Patient seen for complaints of Non-Healing Wounds x 3 Electronic Signature(s) Signed: 05/17/2022 1:55:13 PM By: Fredirick Maudlin MD FACS Entered By: Fredirick Maudlin on 05/17/2022 13:55:13 -------------------------------------------------------------------------------- Debridement Details Patient Name: Date of Service: Jacob [name]. 05/17/2022 1:15 PM Medical Record Number: [id number] Patient Account Number: [id number] Date of Birth/Sex: Treating RN: 07/23/1951 (71 y.o. Jacob [name] Primary Care Provider: Alain Honey Other Clinician: Referring Provider: Treating Provider/Extender: Neil Crouch in Treatment: 11 Debridement Performed for Assessment: Wound #8 Right,Plantar Foot Performed By: Physician Fredirick Maudlin, MD Debridement Type: Debridement Severity of Tissue Pre Debridement: Fat layer exposed Level of Consciousness (Pre-procedure): Awake and Alert Pre-procedure Verification/Time Out Yes - 13:46 Taken: Start Time: 13:46 T Area Debrided (L x W): otal 2 (cm) x 2 (cm) = 4 (cm) Tissue and other material debrided: Viable, Non-Viable, Callus, Slough, Subcutaneous, Slough Level: Skin/Subcutaneous Tissue Debridement Description: Excisional Instrument: Curette Bleeding: Minimum Hemostasis Achieved: Pressure Procedural Pain: 0 Post Procedural Pain: 0 Response to Treatment: Procedure was tolerated well Level of Consciousness (Post-  Awake and Alert procedure): Post Debridement Measurements of Total Wound Length: (cm) 0.4 Width: (cm) 0.4 Depth: (cm) 0.2 Volume: (cm) 0.025 Character of Wound/Ulcer Post Debridement: Improved Severity of Tissue Post Debridement: Fat layer exposed Post Procedure Diagnosis Same as Pre-procedure Electronic Signature(s) Signed: 05/17/2022 1:54:01 PM By: Blanche East RN Signed: 05/17/2022 4:42:49 PM By: Baruch Gouty RN, BSN Signed: 05/18/2022 7:34:21 AM By: Fredirick Maudlin MD FACS Entered By: Blanche East on 05/17/2022 13:49:11 -------------------------------------------------------------------------------- HPI Details Patient Name: Date of Service: Jacob [name]. 05/17/2022 1:15 PM Medical Record Number: [id number] Patient Account Number: [id number] Date of Birth/Sex: Treating RN: Aug 24, 1951 (71 y.o. Jacob [name] Primary Care Provider: Alain Honey Other Clinician: Referring Provider: Treating Provider/Extender: Neil Crouch in Treatment: 11 History of Present Illness Location: 2 ulcers noted to the left hallux Quality: the patient denies pain Duration: these were originally blisters towards the end of February and opened in early March Context: these wounds are secondary to pressure; he got new shoes in February and also started wearing a toe cover/sleeve Modifying Factors: pressure and friction are exacerbating factor HPI Description: 01/12/17- [name] arrives for initial evaluation of 2 ulcers to his left hallux. He states that back in February he did receive a new pair of diabetic shoes and at that time he started wearing toe cover/sleeves and noticed development of a blister to the dorsal and plantar aspect. He has been seen at [facility] by both Dr. Amalia Hailey and Dr. Jacqualyn Posey. He last saw Dr.Wagoner on 3/8 at which time he was prescribed Silvadene and Keflex. He completed that dose of Keflex and states that he got a phone call to  extend the Keflex prior to this appointment. He has a follow-up with Dr. Amalia Hailey on 3/26. He has been wearing a surgical shoe, no significant offloading to the plantar/medial ulcer but the strap does not extend over the dorsal ulcer. Topically he has been applying the Silvadene daily and a dry dressing. He is a diabetic, on insulin,  with a recent A1c of 6.2% (11/10/16). He is a current smoker, 0.5 PPD. He has no known diagnosis of arterial insufficiency. 01/20/17- The patient arrives for follow up evaluation of his ulcers to the left hallux. He admits to continuing to smoke approximately 0.5ppd. He states that his diet has been somewhat uncontrolled with higher than normal glucose levels, this seems circumstantial as he had a death in the family as food and schedule has been disrupted. he admits to wearing the surgical show with offloading modifications while out of the home, but has been ambulating barefoot around the home. he saw Dr. Amalia Hailey yesterday for toenail trimming and callus shaving to the right plantar foot and investigation of his ulcers. 01/26/17- he is here for follow-up evaluation of his left hallux ulcers. He states he has been significantly more compliant in the use of his offloading surgical shoe, with the exception of "getting up for a glass of water" he states that he has worn it with any ambulation. He states he has decreased his smoking too, admitting to only a cigarette since yesterday. He states his blood sugar this morning was 147 and yesterday was 116. He is voicing no complaints or concerns 02/02/17- He is here for follow-up evaluation of his left hallux ulcers. The dorsal ulcer is crusted over/healed. He states he has minimized his ambulation over this past week, much less than the previous week. He states his glucose levels have been between 120-150 about half of the time, otherwise greater than 150. He cannot correlate any specific dietary changes. He does have an appointment with a  dietitian next month. He was advised to keep a log of his glucose levels and his diet for that appointment. 02/06/17; patient comes in today for first application of the total contact cast. Her intake nurse reported the wounds look better therefore I did not actually see these before application of the cast. He'll return on Thursday for reapplication in the standard fashion 02/10/27- he is here for follow-up evaluation of his left plantar hallux ulcer. He came in Monday for application of a total contact cast. He states that he was tolerating that Monday, but noticed on Tuesday that his foot began to slide in the cast. He also is complaining of the heaviness of the cast, as he ambulates as his primary form of transportation. He continues to smoke,. He has not contacted his cardiologist regarding Nicorette gum. He states his blood sugars have been slightly elevated, this morning being 206. He states that he will complete an antibiotic tonight. He states this was prescribed by Dr. Jacqualyn Posey. He states that he has refilled this prescription twice, without direction to continue taking this medication. 02/16/17; patient wound about the same as last week per her description. Rolled edges of callus scan and nonviable subcutaneous tissue. Using Prisma. Apparently used topical antibiotic instead of the Prisma over the course of this week. He has not a total contact cast candidate secondary to ambulation needs and unsteadiness on his feet per discussion with our intake nurse 03/02/17 patient requires debridement. He has a small wound on the right great toe which probes however does not approach bone. What I can see of the base of this wound looks reasonably healthy and I think this is largely in offloading issue with the second toe crossing over the first. He did not tolerate a total contact cast has balance issues 5/171/8. 0.2x0.2x0.2 less depth. Using prisma 03/16/17; no major change. Still a small probing wound  with copious  amounts of surrounding callus skin nonviable subcutaneous tissue all of which debrided. Still using Prisma. I suspect the dimensions post debridement are about the same as listed above 0.2 x 0.2 x 0.2 03/23/17; no major change using Prisma. Once again a large amount of callus nonviable subcutaneous tissue around the wound. I went over the offloading issues with the patient and the nurse. He did not tolerate a total contact cast he uses public transportation among other issues. 03/30/17; no major change using Prisma. Once again a large amount of callus and nonviable tissue around the wound requires debridement. Saw Dr. Amalia Hailey who pared calluses on the other foot. 04/06/17; the wound is much smaller there is callus around this although I think I can see the base of the wound and I elected not to do any debridement. 04/13/17 this is a wound I did not debridement last week left callus intact. He arrives today with the area much in the same predicament however this time I remove the callus to find a deep probing hole. This is disappointing this is not go to bone. We have been using Prisma. 04/20/17 plantar left first toe. Again although a lot of circumferential callus around the wound which requires debridement however this does not appear to be a probing area. Culture I did last week show coag-negative staph. I thought this was probably not something that was worth treating but In the interim he was seen by his primary care with parotitis. He is now on clindamycin and Cipro and the Staphylococcus we cultured is Cipro sensitive [lugdunensis} 04/27/17; plantar left first toe. Again a lot of circumferential callus around a small open area. Removing the callus also nonviable subcutaneous tissue. The wound looks better today. He has completed his antibiotics for the parotitis. I didn't think additional antibiotics were necessary in terms of the wound 05/11/17; plantar first toe. I thought this might be  closed this week however once again he has a deep probing small hole. Using pickups and a scalpel debridement of thick callus and subcutaneous tissue from around the wound reveals a depth of about 0.4 cm. We have not been able to offload the area. There is no obvious infection here. No evidence of ischemia 05/18/17; since his last visit he's been in hospital for oral surgery apparently to remove a retained surgical screw. He has swelling of his jaw and has some discomfort. He arrives today with the plantar left first toe in the same predicament. No major change. Each time he comes in here he has thick adherent callus 05/25/17; arrives today with roughly the same condition of the plantar left great toe. Perhaps somewhat better less callus. There are no options for offloading. He is on his feet a lot and I think this is largely a pressure issue X-ray I did last week showed no evidence of osteomyelitis. He has a toe separator as the great toe tends to ride under the second toe when he is walking. I'm hopeful that this will allow for a little less pressure on the plantar aspect of the left great toe 06/07/2017 -- he is working hard on giving up smoking and other than that has been wearing his diabetic shoes and is also trying to offload as much as possible. 06/15/17; patient arrived in clinic today with a difficult area on his left medial great toe closed over. There is still amount of callus on this however I don't believe there is any underlying open wound. He has custom shoes with inserts.  I still think he is going to need to offload this area going forward. He also follows with Dr. Amalia Hailey of podiatry for routine foot care READMISSION 11/01/16; patient arrives in clinic today with a five-day history of a new area on the previously problematic left great toe. He is wearing his diabetic shoes and he apparently has a new pair coming. The patient is a type II diabetic with neuropathy. He follows with Dr. Amalia Hailey  of podiatry for pre-ulcerative callus on his bilateral feet last seen on 08/24/17. He has developed a large blister on top of his left great toe this is since gone down somewhat. He is not been dressing this specifically. He does not have a history of significant PAD previous ABIs in this clinic were1.1 11/06/17; left great toe has epithelialized since last time. I think this was caused by a insoles an issue pushing the toe against the dorsal aspect of the shoe itself has new diabetic shoes coming hopefully will have more forefoot depth. READMISSION 03/01/2022 This is a now 71 year old man with a past medical history significant for type 2 diabetes mellitus with end-stage renal disease on hemodialysis as as well as congestive heart failure, ischemic cardiomyopathy, hypertension, and prior diabetic foot ulcers. He has had multiple issues with his hemodialysis access. He had a prior left radiocephalic fistula that failed. In October 2020, he had a left brachiobasilic fistula. He apparently underwent a procedure at an outside vascular office on November 09, 2021. Subsequent to that, he developed a wound on his left posterior forearm just distal to the elbow as well as wounds on 2 of his left digits. He was diagnosed with steal syndrome and recently underwent ligation of his fistula with placement of a hemodialysis catheter. He is here today for assistance in healing the wounds that developed related to steal syndrome. He says that he has been applying Neosporin to the sites and they are fairly sensitive to the touch. 03/08/2022: The wounds on his fingers are much smaller and have just a little bit of accumulated thin eschar. The wound on his left posterior forearm is also smaller but has accumulated both slough and eschar. He also has a thick callus on his right fifth metatarsal head plantar surface and unfortunately, it appears that there is an open wound underneath the callus. We have been using Iodosorb  with Hydrofera Blue to all of his open wounds. Pain is much improved from last week. 03/15/2022: The wounds on his fingers have healed. The wound on his left posterior forearm is smaller and has a nice layer of granulation tissue with just a bit of accumulated slough. He has built up additional callus around the right fifth metatarsal head plantar surface, but the underlying wound has a good surface with granulation tissue present. We have been using Iodosorb with Hydrofera Blue. He did not endorse any pain this week. 03/22/2022: The wound on his left posterior forearm continues to contract. There is good granulation tissue present and just a little bit of periwound eschar and overlying slough. The right fifth metatarsal head wound has once again accumulated some callus and there is slough within the wound. The intake nurse measured a area of undermining from 7-10 o'clock. 03/29/2022: The patient saw podiatry this week for nail trim and they also pared back a number of calluses. The wound on his left posterior forearm is substantially smaller with just some overlying thin eschar. The right fifth metatarsal head has a bit of callus accumulation as well as  some slough in the wound bed but is otherwise clean. It is smaller today, as well. 04/05/2022: The wound on his left posterior forearm is nearly closed with just a small amount of overlying eschar. The right fifth metatarsal head wound is smaller again this week. There is some periwound callus accumulation and some slough in the wound bed. He is going to get new dialysis access placed next week. 04/19/2022: The wound on his left posterior forearm is closed. The right fifth metatarsal head wound is about the same size with periwound callus and slough accumulation. He did have a new right forearm PTFE AV graft placed last week. His right forearm and hand are quite swollen, warm, and red today. He was apparently seen in the emergency room yesterday and they  did not think it was infected and attributed the swelling to normal postop findings. 04/28/2022: The wound on his foot has accumulated a lot of periwound callus, but the actual open surface is quite small. There is just a little bit of slough on the surface. 05/03/2022: His wound is even smaller today with less periwound callus accumulation. He continues to build up slough but the underlying tissue is healthy and viable-appearing. 05/10/2022: The orifice of the wound continues to contract, but there is some undermining and periwound callus formation. No significant accumulation of slough. 05/17/2022: The wound is about the same size today, perhaps slightly smaller. He does have periwound callus accumulation, but it does not overlap or create any undermining. There is some slough in the base of the wound. Electronic Signature(s) Signed: 05/17/2022 1:55:51 PM By: Fredirick Maudlin MD FACS Entered By: Fredirick Maudlin on 05/17/2022 13:55:50 -------------------------------------------------------------------------------- Physical Exam Details Patient Name: Date of Service: Jacob [name] 05/17/2022 1:15 PM Medical Record Number: [id number] Patient Account Number: [id number] Date of Birth/Sex: Treating RN: 05/14/1951 (71 y.o. Jacob [name] Primary Care Provider: Alain Honey Other Clinician: Referring Provider: Treating Provider/Extender: Neil Crouch in Treatment: 11 Constitutional . . . . No acute distress.[name] Respiratory Normal work of breathing on room air.. Notes 05/17/2022: The wound is about the same size today, perhaps slightly smaller. He does have periwound callus accumulation, but it does not overlap or create any undermining. There is some slough in the base of the wound. Electronic Signature(s) Signed: 05/17/2022 1:56:16 PM By: Fredirick Maudlin MD FACS Entered By: Fredirick Maudlin on 05/17/2022  13:56:16 -------------------------------------------------------------------------------- Physician Orders Details Patient Name: Date of Service: Jacob [name]. 05/17/2022 1:15 PM Medical Record Number: [id number] Patient Account Number: [id number] Date of Birth/Sex: Treating RN: 1951/05/12 (71 y.o. Jacob [name] Primary Care Provider: Alain Honey Other Clinician: Referring Provider: Treating Provider/Extender: Neil Crouch in Treatment: 11 Verbal / Phone Orders: No Diagnosis Coding ICD-10 Coding Code Description [phone] Non-pressure chronic ulcer of other part of right foot with fat layer exposed E11.22 Type 2 diabetes mellitus with diabetic chronic kidney disease J44.9 Chronic obstructive pulmonary disease, unspecified I50.42 Chronic combined systolic (congestive) and diastolic (congestive) heart failure I10 Essential (primary) hypertension N25.81 Secondary hyperparathyroidism of renal origin N18.6 End stage renal disease B20 Human immunodeficiency virus [HIV] disease Follow-up Appointments ppointment in 1 week. - Dr. Celine Ahr - Room 1 Return A Tuesday 05/31/22 @ 1:15 pm Bathing/ Shower/ Hygiene Other Bathing/Shower/Hygiene Orders/Instructions: - Change dressing after bathing Off-Loading Other: - wear custom diabetic shoes at al times when walking Wound Treatment Wound #8 - Foot Wound Laterality: Plantar, Right Cleanser: Soap and Water Every Other Day/7  Days Discharge Instructions: May shower and wash wound with dial antibacterial soap and water prior to dressing change. Cleanser: Wound Cleanser (Generic) Every Other Day/7 Days Discharge Instructions: Cleanse the wound with wound cleanser prior to applying a clean dressing using gauze sponges, not tissue or cotton balls. Prim Dressing: KerraCel Ag Gelling Fiber Dressing, 2x2 in (silver alginate) Every Other Day/7 Days ary Discharge Instructions: Apply silver alginate to wound bed as  instructed Secondary Dressing: Bordered Gauze, 4x4 in (Generic) Every Other Day/7 Days Discharge Instructions: Apply over primary dressing as directed. Secondary Dressing: Optifoam Non-Adhesive Dressing, 4x4 in Every Other Day/7 Days Discharge Instructions: Apply over primary dressing cut to make foam donut Electronic Signature(s) Signed: 05/18/2022 7:34:21 AM By: Fredirick Maudlin MD FACS Previous Signature: 05/17/2022 1:54:01 PM Version By: Blanche East RN Entered By: Fredirick Maudlin on 05/17/2022 13:56:27 -------------------------------------------------------------------------------- Problem List Details Patient Name: Date of Service: Jacob [name]. 05/17/2022 1:15 PM Medical Record Number: [id number] Patient Account Number: [id number] Date of Birth/Sex: Treating RN: 10-Jul-1951 (71 y.o. 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 fat layer exposed 03/08/2022 No Yes E11.22 Type 2 diabetes mellitus with diabetic chronic kidney disease 03/01/2022 No Yes J44.9 Chronic obstructive pulmonary disease, unspecified 03/01/2022 No Yes I50.42 Chronic combined systolic (congestive) and diastolic (congestive) heart failure 03/01/2022 No Yes I10 Essential (primary) hypertension 03/01/2022 No Yes N25.81 Secondary hyperparathyroidism of renal origin 03/01/2022 No Yes N18.6 End stage renal disease 03/01/2022 No Yes B20 Human immunodeficiency virus [HIV] disease 03/01/2022 No Yes Inactive Problems Resolved Problems ICD-10 Code Description Active Date Resolved Date L98.499 Non-pressure chronic ulcer of skin of other sites with unspecified severity 03/01/2022 03/01/2022 Electronic Signature(s) Signed: 05/17/2022 1:51:08 PM By: Fredirick Maudlin MD FACS Entered By: Fredirick Maudlin on 05/17/2022 13:51:08 -------------------------------------------------------------------------------- Progress Note Details Patient Name: Date of Service: Jacob [name]. 05/17/2022 1:15 PM Medical Record Number: [id number] Patient Account Number: [id number] Date of Birth/Sex: Treating RN: 1951/03/25 (71 y.o. Jacob [name] Primary Care Provider: Alain Honey Other Clinician: Referring Provider: Treating Provider/Extender: Neil Crouch in Treatment: 11 Subjective Chief Complaint Information obtained from Patient Patient seen for complaints of Non-Healing Wounds x 3 History of Present Illness (HPI) The following HPI elements were documented for the patient's wound: Location: 2 ulcers noted to the left hallux Quality: the patient denies pain Duration: these were originally blisters towards the end of February and opened in early March Context: these wounds are secondary to pressure; he got new shoes in February and also started wearing a toe cover/sleeve Modifying Factors: pressure and friction are exacerbating factor 01/12/17- Mr. Ohms arrives for initial evaluation of 2 ulcers to his left hallux. He states that back in February he did receive a new pair of diabetic shoes and at that time he started wearing toe cover/sleeves and noticed development of a blister to the dorsal and plantar aspect. He has been seen at [facility] by both Dr. Amalia Hailey and Dr. Jacqualyn Posey. He last saw Dr.Wagoner on 3/8 at which time he was prescribed Silvadene and Keflex. He completed that dose of Keflex and states that he got a phone call to extend the Keflex prior to this appointment. He has a follow-up with Dr. Amalia Hailey on 3/26. He has been wearing a surgical shoe,  no significant offloading to the plantar/medial ulcer but the strap does not extend over the dorsal ulcer. T opically he has been applying the Silvadene daily and a dry  dressing. He is a diabetic, on insulin, with a recent A1c of 6.2% (11/10/16). He is a current smoker, 0.5 PPD. He has no known diagnosis of arterial insufficiency. 01/20/17- The patient arrives for follow up evaluation of his ulcers to the left hallux. He admits to continuing to smoke approximately 0.5ppd. He states that his diet has been somewhat uncontrolled with higher than normal glucose levels, this seems circumstantial as he had a death in the family as food and schedule has been disrupted. he admits to wearing the surgical show with offloading modifications while out of the home, but has been ambulating barefoot around the home. he saw Dr. Amalia Hailey yesterday for toenail trimming and callus shaving to the right plantar foot and investigation of his ulcers. 01/26/17- he is here for follow-up evaluation of his left hallux ulcers. He states he has been significantly more compliant in the use of his offloading surgical shoe, with the exception of "getting up for a glass of water" he states that he has worn it with any ambulation. He states he has decreased his smoking too, admitting to only a cigarette since yesterday. He states his blood sugar this morning was 147 and yesterday was 116. He is voicing no complaints or concerns 02/02/17- He is here for follow-up evaluation of his left hallux ulcers. The dorsal ulcer is crusted over/healed. He states he has minimized his ambulation over this past week, much less than the previous week. He states his glucose levels have been between 120-150 about half of the time, otherwise greater than 150. He cannot correlate any specific dietary changes. He does have an appointment with a dietitian next month. He was advised to keep a log of his glucose levels and his diet for that appointment. 02/06/17; patient comes in today for first application of the total contact cast. Her intake nurse reported the wounds look better therefore I did not actually see these before  application of the cast. He'll return on Thursday for reapplication in the standard fashion 02/10/27- he is here for follow-up evaluation of his left plantar hallux ulcer. He came in Monday for application of a total contact cast. He states that he was tolerating that Monday, but noticed on Tuesday that his foot began to slide in the cast. He also is complaining of the heaviness of the cast, as he ambulates as his primary form of transportation. He continues to smoke,. He has not contacted his cardiologist regarding Nicorette gum. He states his blood sugars have been slightly elevated, this morning being 206. He states that he will complete an antibiotic tonight. He states this was prescribed by Dr. Jacqualyn Posey. He states that he has refilled this prescription twice, without direction to continue taking this medication. 02/16/17; patient wound about the same as last week per her description. Rolled edges of callus scan and nonviable subcutaneous tissue. Using Prisma. Apparently used topical antibiotic instead of the Prisma over the course of this week. He has not a total contact cast candidate secondary to ambulation needs and unsteadiness on his feet per discussion with our intake nurse 03/02/17 patient requires debridement. He has a small wound on the right great toe which probes however does not approach bone. What I can see of the base of this wound looks reasonably healthy and I think this is largely in offloading issue  with the second toe crossing over the first. He did not tolerate a total contact cast has balance issues 5/171/8. 0.2x0.2x0.2 less depth. Using prisma 03/16/17; no major change. Still a small probing wound with copious amounts of surrounding callus skin nonviable subcutaneous tissue all of which debrided. Still using Prisma. I suspect the dimensions post debridement are about the same as listed above 0.2 x 0.2 x 0.2 03/23/17; no major change using Prisma. Once again a large amount of  callus nonviable subcutaneous tissue around the wound. I went over the offloading issues with the patient and the nurse. He did not tolerate a total contact cast he uses public transportation among other issues. 03/30/17; no major change using Prisma. Once again a large amount of callus and nonviable tissue around the wound requires debridement. Saw Dr. Amalia Hailey who pared calluses on the other foot. 04/06/17; the wound is much smaller there is callus around this although I think I can see the base of the wound and I elected not to do any debridement. 04/13/17 this is a wound I did not debridement last week left callus intact. He arrives today with the area much in the same predicament however this time I remove the callus to find a deep probing hole. This is disappointing this is not go to bone. We have been using Prisma. 04/20/17 plantar left first toe. Again although a lot of circumferential callus around the wound which requires debridement however this does not appear to be a probing area. Culture I did last week show coag-negative staph. I thought this was probably not something that was worth treating but In the interim he was seen by his primary care with parotitis. He is now on clindamycin and Cipro and the Staphylococcus we cultured is Cipro sensitive [lugdunensis} 04/27/17; plantar left first toe. Again a lot of circumferential callus around a small open area. Removing the callus also nonviable subcutaneous tissue. The wound looks better today. He has completed his antibiotics for the parotitis. I didn't think additional antibiotics were necessary in terms of the wound 05/11/17; plantar first toe. I thought this might be closed this week however once again he has a deep probing small hole. Using pickups and a scalpel debridement of thick callus and subcutaneous tissue from around the wound reveals a depth of about 0.4 cm. We have not been able to offload the area. There is no obvious infection here. No  evidence of ischemia 05/18/17; since his last visit he's been in hospital for oral surgery apparently to remove a retained surgical screw. He has swelling of his jaw and has some discomfort. He arrives today with the plantar left first toe in the same predicament. No major change. Each time he comes in here he has thick adherent callus 05/25/17; arrives today with roughly the same condition of the plantar left great toe. Perhaps somewhat better less callus. There are no options for offloading. He is on his feet a lot and I think this is largely a pressure issue X-ray I did last week showed no evidence of osteomyelitis. He has a toe separator as the great toe tends to ride under the second toe when he is walking. I'm hopeful that this will allow for a little less pressure on the plantar aspect of the left great toe 06/07/2017 -- he is working hard on giving up smoking and other than that has been wearing his diabetic shoes and is also trying to offload as much as possible. 06/15/17; patient arrived in clinic  today with a difficult area on his left medial great toe closed over. There is still amount of callus on this however I don't believe there is any underlying open wound. He has custom shoes with inserts. I still think he is going to need to offload this area going forward. He also follows with Dr. Amalia Hailey of podiatry for routine foot care READMISSION 11/01/16; patient arrives in clinic today with a five-day history of a new area on the previously problematic left great toe. He is wearing his diabetic shoes and he apparently has a new pair coming. The patient is a type II diabetic with neuropathy. He follows with Dr. Amalia Hailey of podiatry for pre-ulcerative callus on his bilateral feet last seen on 08/24/17. He has developed a large blister on top of his left great toe this is since gone down somewhat. He is not been dressing this specifically. He does not have a history of significant PAD previous ABIs in  this clinic were1.1 11/06/17; left great toe has epithelialized since last time. I think this was caused by a insoles an issue pushing the toe against the dorsal aspect of the shoe itself has new diabetic shoes coming hopefully will have more forefoot depth. READMISSION 03/01/2022 This is a now 71 year old man with a past medical history significant for type 2 diabetes mellitus with end-stage renal disease on hemodialysis as as well as congestive heart failure, ischemic cardiomyopathy, hypertension, and prior diabetic foot ulcers. He has had multiple issues with his hemodialysis access. He had a prior left radiocephalic fistula that failed. In October 2020, he had a left brachiobasilic fistula. He apparently underwent a procedure at an outside vascular office on November 09, 2021. Subsequent to that, he developed a wound on his left posterior forearm just distal to the elbow as well as wounds on 2 of his left digits. He was diagnosed with steal syndrome and recently underwent ligation of his fistula with placement of a hemodialysis catheter. He is here today for assistance in healing the wounds that developed related to steal syndrome. He says that he has been applying Neosporin to the sites and they are fairly sensitive to the touch. 03/08/2022: The wounds on his fingers are much smaller and have just a little bit of accumulated thin eschar. The wound on his left posterior forearm is also smaller but has accumulated both slough and eschar. He also has a thick callus on his right fifth metatarsal head plantar surface and unfortunately, it appears that there is an open wound underneath the callus. We have been using Iodosorb with Hydrofera Blue to all of his open wounds. Pain is much improved from last week. 03/15/2022: The wounds on his fingers have healed. The wound on his left posterior forearm is smaller and has a nice layer of granulation tissue with just a bit of accumulated slough. He has built up  additional callus around the right fifth metatarsal head plantar surface, but the underlying wound has a good surface with granulation tissue present. We have been using Iodosorb with Hydrofera Blue. He did not endorse any pain this week. 03/22/2022: The wound on his left posterior forearm continues to contract. There is good granulation tissue present and just a little bit of periwound eschar and overlying slough. The right fifth metatarsal head wound has once again accumulated some callus and there is slough within the wound. The intake nurse measured a area of undermining from 7-10 o'clock. 03/29/2022: The patient saw podiatry this week for nail trim and they  also pared back a number of calluses. The wound on his left posterior forearm is substantially smaller with just some overlying thin eschar. The right fifth metatarsal head has a bit of callus accumulation as well as some slough in the wound bed but is otherwise clean. It is smaller today, as well. 04/05/2022: The wound on his left posterior forearm is nearly closed with just a small amount of overlying eschar. The right fifth metatarsal head wound is smaller again this week. There is some periwound callus accumulation and some slough in the wound bed. He is going to get new dialysis access placed next week. 04/19/2022: The wound on his left posterior forearm is closed. The right fifth metatarsal head wound is about the same size with periwound callus and slough accumulation. He did have a new right forearm PTFE AV graft placed last week. His right forearm and hand are quite swollen, warm, and red today. He was apparently seen in the emergency room yesterday and they did not think it was infected and attributed the swelling to normal postop findings. 04/28/2022: The wound on his foot has accumulated a lot of periwound callus, but the actual open surface is quite small. There is just a little bit of slough on the surface. 05/03/2022: His wound is even  smaller today with less periwound callus accumulation. He continues to build up slough but the underlying tissue is healthy and viable-appearing. 05/10/2022: The orifice of the wound continues to contract, but there is some undermining and periwound callus formation. No significant accumulation of slough. 05/17/2022: The wound is about the same size today, perhaps slightly smaller. He does have periwound callus accumulation, but it does not overlap or create any undermining. There is some slough in the base of the wound. Patient History Information obtained from Patient. Family History Diabetes - Siblings,Father, Heart Disease - Father,Siblings, Hypertension - Father,Siblings, No family history of Cancer, Hereditary Spherocytosis, Kidney Disease, Lung Disease, Seizures, Stroke, Thyroid Problems, Tuberculosis. Social History Current every day smoker - 1/2 ppd / 64yr, Marital Status - Single, Alcohol Use - Moderate - 6 standard drinks per week, Drug Use - Prior History - Polysubstance Abuse; clean for 9 years, Caffeine Use - Rarely - tea. Medical History Eyes Patient has history of Cataracts - mild, Glaucoma Denies history of Optic Neuritis Ear/Nose/Mouth/Throat Denies history of Chronic sinus problems/congestion, Middle ear problems Hematologic/Lymphatic Patient has history of Anemia Denies history of Hemophilia, Human Immunodeficiency Virus, Lymphedema, Sickle Cell Disease Respiratory Patient has history of Chronic Obstructive Pulmonary Disease (COPD) Denies history of Aspiration, Asthma, Pneumothorax, Sleep Apnea, Tuberculosis Cardiovascular Patient has history of Congestive Heart Failure, Coronary Artery Disease, Hypertension, Myocardial Infarction - 2004 Denies history of Angina, Arrhythmia, Deep Vein Thrombosis, Hypotension, Peripheral Arterial Disease, Peripheral Venous Disease, Phlebitis, Vasculitis Gastrointestinal Patient has history of Hepatitis C - 1968 Denies history of  Cirrhosis , Colitis, Crohnoos, Hepatitis A, Hepatitis B Endocrine Patient has history of Type II Diabetes Denies history of Type I Diabetes Genitourinary Denies history of End Stage Renal Disease Immunological Denies history of Lupus Erythematosus, Raynaudoos, Scleroderma Integumentary (Skin) Denies history of History of Burn Musculoskeletal Patient has history of Gout, Osteoarthritis Denies history of Rheumatoid Arthritis, Osteomyelitis Neurologic Patient has history of Neuropathy Denies history of Dementia, Quadriplegia, Paraplegia, Seizure Disorder Oncologic Denies history of Received Chemotherapy, Received Radiation Psychiatric Denies history of Anorexia/bulimia, Confinement Anxiety Hospitalization/Surgery History - Colonoscopy. - CABG. - Laproscopic Cholecystectomy. - Left Hip Fx. - Left Jaw Fx. - Lumbar Laminectomy. Medical A Surgical History  Notes nd Constitutional Symptoms (General Health) Insomnia Syncope Alcohol Dependence HIV Hematologic/Lymphatic Hyperlipidemia Hyperglycemia Cardiovascular CABG, ischemic cardiomyopathy Gastrointestinal GERD Genitourinary Syphilis CKD,Nocturia Immunological HIV Musculoskeletal Hip Fx BiLateral Bunions Unspecified Arthritis Neurologic chronic back pain Objective Constitutional No acute distress.. Vitals Time Taken: 1:21 PM, Height: 74 in, Weight: 172 lbs, BMI: 22.1, Temperature: 97.8 F, Pulse: 79 bpm, Respiratory Rate: 18 breaths/min, Blood Pressure: 120/69 mmHg, Capillary Blood Glucose: 230 mg/dl. Respiratory Normal work of breathing on room air.. General Notes: 05/17/2022: The wound is about the same size today, perhaps slightly smaller. He does have periwound callus accumulation, but it does not overlap or create any undermining. There is some slough in the base of the wound. Integumentary (Hair, Skin) Wound #8 status is Open. Original cause of wound was Blister. The date acquired was: 03/08/2022. The wound has been in  treatment 10 weeks. The wound is located on the [place]. The wound measures 0.4cm length x 0.4cm width x 0.2cm depth; 0.126cm^2 area and 0.025cm^3 volume. There is Fat Layer (Subcutaneous Tissue) exposed. There is no tunneling or undermining noted. There is a small amount of serosanguineous drainage noted. The wound margin is thickened. There is medium (34-66%) pink granulation within the wound bed. There is a medium (34-66%) amount of necrotic tissue within the wound bed including Adherent Slough. 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 Chronic obstructive pulmonary disease, unspecified Chronic combined systolic (congestive) and diastolic (congestive) heart failure Essential (primary) hypertension Secondary hyperparathyroidism of renal origin End stage renal disease Human immunodeficiency virus [HIV] disease Procedures Wound #8 Pre-procedure diagnosis of Wound #8 is a Diabetic Wound/Ulcer of the Lower Extremity located on the Right,Plantar Foot .Severity of Tissue Pre Debridement is: Fat layer exposed. There was a Excisional Skin/Subcutaneous Tissue Debridement with a total area of 4 sq cm performed by Fredirick Maudlin, MD. With the following instrument(s): Curette to remove Viable and Non-Viable tissue/material. Material removed includes Callus, Subcutaneous Tissue, and Slough. No specimens were taken. A time out was conducted at 13:46, prior to the start of the procedure. A Minimum amount of bleeding was controlled with Pressure. The procedure was tolerated well with a pain level of 0 throughout and a pain level of 0 following the procedure. Post Debridement Measurements: 0.4cm length x 0.4cm width x 0.2cm depth; 0.025cm^3 volume. Character of Wound/Ulcer Post Debridement is improved. Severity of Tissue Post Debridement is: Fat layer  exposed. Post procedure Diagnosis Wound #8: Same as Pre-Procedure Plan Follow-up Appointments: Return Appointment in 1 week. - Dr. Celine Ahr - Room 1 Tuesday 05/31/22 @ 1:15 pm Bathing/ Shower/ Hygiene: Other Bathing/Shower/Hygiene Orders/Instructions: - Change dressing after bathing Off-Loading: Other: - wear custom diabetic shoes at al times when walking WOUND #8: - Foot Wound Laterality: Plantar, Right Cleanser: Soap and Water Every Other Day/7 Days Discharge Instructions: May shower and wash wound with dial antibacterial soap and water prior to dressing change. Cleanser: Wound Cleanser (Generic) Every Other Day/7 Days Discharge Instructions: Cleanse the wound with wound cleanser prior to applying a clean dressing using gauze sponges, not tissue or cotton balls. Prim Dressing: KerraCel Ag Gelling Fiber Dressing, 2x2 in (silver alginate) Every Other Day/7 Days ary Discharge Instructions: Apply silver alginate to wound bed as instructed Secondary Dressing: Bordered Gauze, 4x4 in (Generic) Every Other Day/7 Days Discharge Instructions: Apply over primary dressing as directed. Secondary Dressing: Optifoam Non-Adhesive Dressing, 4x4 in Every Other Day/7 Days Discharge  Instructions: Apply over primary dressing cut to make foam donut 05/17/2022: The wound is about the same size today, perhaps slightly smaller. He does have periwound callus accumulation, but it does not overlap or create any undermining. There is some slough in the base of the wound. I used a curette to debride periwound callus as well as senescent skin and nonviable subcutaneous tissue. We will continue to use silver alginate for his wound dressing. Due to scheduling issues, he will follow-up in 2 weeks. Electronic Signature(s) Signed: 05/17/2022 1:57:25 PM By: Fredirick Maudlin MD FACS Entered By: Fredirick Maudlin on 05/17/2022 13:57:25 -------------------------------------------------------------------------------- HxROS  Details Patient Name: Date of Service: Jacob [name]. 05/17/2022 1:15 PM Medical Record Number: [id number] Patient Account Number: [id number] Date of Birth/Sex: Treating RN: [birth year] (71 y.o. Jacob [name] Primary Care Provider: Alain Honey Other Clinician: Referring Provider: Treating Provider/Extender: Neil Crouch in Treatment: 11 Information Obtained From Patient Constitutional Symptoms (General Health) Medical History: Past Medical History Notes: Insomnia Syncope Alcohol Dependence HIV Eyes Medical History: Positive for: Cataracts - mild; Glaucoma Negative for: Optic Neuritis Ear/Nose/Mouth/Throat Medical History: Negative for: Chronic sinus problems/congestion; Middle ear problems Hematologic/Lymphatic Medical History: Positive for: Anemia Negative for: Hemophilia; Human Immunodeficiency Virus; Lymphedema; Sickle Cell Disease Past Medical History Notes: Hyperlipidemia Hyperglycemia Respiratory Medical History: Positive for: Chronic Obstructive Pulmonary Disease (COPD) Negative for: Aspiration; Asthma; Pneumothorax; Sleep Apnea; Tuberculosis Cardiovascular Medical History: Positive for: Congestive Heart Failure; Coronary Artery Disease; Hypertension; Myocardial Infarction - 2004 Negative for: Angina; Arrhythmia; Deep Vein Thrombosis; Hypotension; Peripheral Arterial Disease; Peripheral Venous Disease; Phlebitis; Vasculitis Past Medical History Notes: CABG, ischemic cardiomyopathy Gastrointestinal Medical History: Positive for: Hepatitis C - 1968 Negative for: Cirrhosis ; Colitis; Crohns; Hepatitis A; Hepatitis B Past Medical History Notes: GERD Endocrine Medical History: Positive for: Type II Diabetes Negative for: Type I Diabetes Time with diabetes: 13 yrs Treated with: Insulin Blood sugar tested every day: Yes Tested : 2 times per day Blood sugar testing results: Breakfast: 100; Dinner:  95-126 Genitourinary Medical History: Negative for: End Stage Renal Disease Past Medical History Notes: Syphilis CKD,Nocturia Immunological Medical History: Negative for: Lupus Erythematosus; Raynauds; Scleroderma Past Medical History Notes: HIV Integumentary (Skin) Medical History: Negative for: History of Burn Musculoskeletal Medical History: Positive for: Gout; Osteoarthritis Negative for: Rheumatoid Arthritis; Osteomyelitis Past Medical History Notes: Hip Fx BiLateral Bunions Unspecified Arthritis Neurologic Medical History: Positive for: Neuropathy Negative for: Dementia; Quadriplegia; Paraplegia; Seizure Disorder Past Medical History Notes: chronic back pain Oncologic Medical History: Negative for: Received Chemotherapy; Received Radiation Psychiatric Medical History: Negative for: Anorexia/bulimia; Confinement Anxiety HBO Extended History Items Eyes: Eyes: Cataracts Glaucoma Immunizations Pneumococcal Vaccine: Received Pneumococcal Vaccination: Yes Received Pneumococcal Vaccination On or After 60th Birthday: Yes Implantable Devices No devices added Hospitalization / Surgery History Type of Hospitalization/Surgery Colonoscopy CABG Laproscopic Cholecystectomy Left Hip Fx Left Jaw Fx Lumbar Laminectomy Family and Social History Cancer: No; Diabetes: Yes - Siblings,Father; Heart Disease: Yes - Father,Siblings; Hereditary Spherocytosis: No; Hypertension: Yes - Father,Siblings; Kidney Disease: No; Lung Disease: No; Seizures: No; Stroke: No; Thyroid Problems: No; Tuberculosis: No; Current every day smoker - 1/2 ppd / 54yr; Marital Status - Single; Alcohol Use: Moderate - 6 standard drinks per week; Drug Use: Prior History - Polysubstance Abuse; clean for 9 years; Caffeine Use: Rarely - tea; Financial Concerns: No; Food, Clothing or Shelter Needs: No; Support System Lacking: No; Transportation Concerns: No Electronic Signature(s) Signed: 05/17/2022 4:42:49 PM  By: BBaruch GoutyRN, BSN Signed: 05/18/2022 7:34:21 AM By:  Fredirick Maudlin MD FACS Entered By: Fredirick Maudlin on 05/17/2022 13:55:57 -------------------------------------------------------------------------------- SuperBill Details Patient Name: Date of Service: Jacob [name], Jacob [name] 05/17/2022 Medical Record Number: [id number] Patient Account Number: [id number] Date of Birth/Sex: Treating RN: September 13, 1951 (71 y.o. Jacob [name] Primary Care Provider: Alain Honey Other Clinician: Referring Provider: Treating Provider/Extender: Neil Crouch in Treatment: 11 Diagnosis Coding ICD-10 Codes Code Description [phone] Non-pressure chronic ulcer of other part of right foot with fat layer exposed E11.22 Type 2 diabetes mellitus with diabetic chronic kidney disease J44.9 Chronic obstructive pulmonary disease, unspecified I50.42 Chronic combined systolic (congestive) and diastolic (congestive) heart failure I10 Essential (primary) hypertension N25.81 Secondary hyperparathyroidism of renal origin N18.6 End stage renal disease B20 Human immunodeficiency virus [HIV] disease Facility Procedures CPT4 Code: [id number] Description: [place] - DEB SUBQ TISSUE 20 SQ CM/< ICD-10 Diagnosis Description L97.512 Non-pressure chronic ulcer of other part of right foot with fat layer exposed Modifier: Quantity: 1 Physician Procedures : CPT4 Code Description Modifier [id number] - WC PHYS LEVEL 3 - EST PT 25 ICD-10 Diagnosis Description L97.512 Non-pressure chronic ulcer of other part of right foot with fat layer exposed E11.22 Type 2 diabetes mellitus with diabetic chronic kidney  disease I50.42 Chronic combined systolic (congestive) and diastolic (congestive) heart failure N18.6 End stage renal disease Quantity: 1 : [id number] - WC PHYS SUBQ TISS 20 SQ CM ICD-10 Diagnosis Description L97.512 Non-pressure chronic ulcer of other part of right foot with fat layer  exposed Quantity: 1 Electronic Signature(s) Signed: 05/17/2022 1:57:47 PM By: Fredirick Maudlin MD FACS Entered By: Fredirick Maudlin on 05/17/2022 13:57:47

## 2022-05-19 ENCOUNTER — Other Ambulatory Visit (HOSPITAL_COMMUNITY): Payer: Self-pay

## 2022-05-20 ENCOUNTER — Other Ambulatory Visit (HOSPITAL_COMMUNITY): Payer: Self-pay

## 2022-05-20 DIAGNOSIS — Z992 Dependence on renal dialysis: Secondary | ICD-10-CM | POA: Diagnosis not present

## 2022-05-20 DIAGNOSIS — E1122 Type 2 diabetes mellitus with diabetic chronic kidney disease: Secondary | ICD-10-CM | POA: Diagnosis not present

## 2022-05-20 DIAGNOSIS — N2581 Secondary hyperparathyroidism of renal origin: Secondary | ICD-10-CM | POA: Diagnosis not present

## 2022-05-20 DIAGNOSIS — N186 End stage renal disease: Secondary | ICD-10-CM | POA: Diagnosis not present

## 2022-05-20 DIAGNOSIS — D631 Anemia in chronic kidney disease: Secondary | ICD-10-CM | POA: Diagnosis not present

## 2022-05-20 DIAGNOSIS — T8249XA Other complication of vascular dialysis catheter, initial encounter: Secondary | ICD-10-CM | POA: Diagnosis not present

## 2022-05-20 DIAGNOSIS — E8779 Other fluid overload: Secondary | ICD-10-CM | POA: Diagnosis not present

## 2022-05-23 DIAGNOSIS — Z992 Dependence on renal dialysis: Secondary | ICD-10-CM | POA: Diagnosis not present

## 2022-05-23 DIAGNOSIS — I129 Hypertensive chronic kidney disease with stage 1 through stage 4 chronic kidney disease, or unspecified chronic kidney disease: Secondary | ICD-10-CM | POA: Diagnosis not present

## 2022-05-23 DIAGNOSIS — D631 Anemia in chronic kidney disease: Secondary | ICD-10-CM | POA: Diagnosis not present

## 2022-05-23 DIAGNOSIS — E8779 Other fluid overload: Secondary | ICD-10-CM | POA: Diagnosis not present

## 2022-05-23 DIAGNOSIS — N2581 Secondary hyperparathyroidism of renal origin: Secondary | ICD-10-CM | POA: Diagnosis not present

## 2022-05-23 DIAGNOSIS — N186 End stage renal disease: Secondary | ICD-10-CM | POA: Diagnosis not present

## 2022-05-23 DIAGNOSIS — T8249XA Other complication of vascular dialysis catheter, initial encounter: Secondary | ICD-10-CM | POA: Diagnosis not present

## 2022-05-23 DIAGNOSIS — E1122 Type 2 diabetes mellitus with diabetic chronic kidney disease: Secondary | ICD-10-CM | POA: Diagnosis not present

## 2022-05-24 ENCOUNTER — Ambulatory Visit: Payer: Self-pay

## 2022-05-24 DIAGNOSIS — Z992 Dependence on renal dialysis: Secondary | ICD-10-CM

## 2022-05-24 NOTE — Patient Instructions (Signed)
Visit Information  Thank you for taking time to visit with me today. Please don't hesitate to contact me if I can be of assistance to you.   Following are the goals we discussed today:   Goals Addressed               This Visit's Progress     Patient Stated     COMPLETED: Transportation to future appointments (pt-stated)        Care Coordination Interventions: Discussed the patient has used up health plan transportation benefit and is unsure how he will get to future appointments Advised the patient SW will place a referral to the community resource care guide team for assistance Referral placed for transportation      Other     COMPLETED: Education and Benefit of Advance Care Planning        Care Coordination Interventions: Performed chart review to note patient does not have an advance directive on file Discussed the patient has completed one Instructed the patient to bring a copy into clinic during future office visit in order to have it scanned into his chart        Please call the care guide team at [phone] if you need to schedule an appointment with me.  If you are experiencing a Mental Health or [place] or need someone to talk to, please call 1-800-273-TALK (toll free, 24 hour hotline)  The patient verbalized understanding of instructions, educational materials, and care plan provided today and DECLINED offer to receive copy of patient instructions, educational materials, and care plan.   No further follow up required: A community resource care guide team member will contact you to assist with transportation needs.  Daneen Schick, BSW, CDP Social Worker, Certified Dementia Practitioner Care Coordination [phone]

## 2022-05-24 NOTE — Patient Outreach (Signed)
  Care Coordination   Initial Visit Note   05/24/2022 Name: Jacob [name] MRN: [id number] DOB: Dec 16, 1950  Jacob [name] is a 71 y.o. year old male who sees [name], MD for primary care. I spoke with  [name] by phone today  What matters to the patients health and wellness today?  I need help with transportation   Goals Addressed               This Visit's Progress     Patient Stated     COMPLETED: Transportation to future appointments (pt-stated)        Care Coordination Interventions: Discussed the patient has used up health plan transportation benefit and is unsure how he will get to future appointments Advised the patient SW will place a referral to the community resource care guide team for assistance Referral placed for transportation      Other     COMPLETED: Education and Benefit of Advance Care Planning        Care Coordination Interventions: Performed chart review to note patient does not have an advance directive on file Discussed the patient has completed one Instructed the patient to bring a copy into clinic during future office visit in order to have it scanned into his chart        SDOH assessments and interventions completed:   Yes SDOH Interventions Today    Flowsheet Row Most Recent Value  SDOH Interventions   Food Insecurity Interventions Intervention Not Indicated  Housing Interventions Intervention Not Indicated  Transportation Interventions Other (Comment)  [[id number] to care guide team]       Care Coordination Interventions Activated:  Yes Care Coordination Interventions:  Yes, provided  Follow up plan: Referral made to community resource care guide team.  No SW follow up planned.  Encounter Outcome:  Pt. Visit Completed  Daneen Schick, BSW, CDP Social Worker, Certified Dementia Practitioner Care Coordination [phone]

## 2022-05-25 ENCOUNTER — Telehealth: Payer: Self-pay

## 2022-05-25 DIAGNOSIS — Z992 Dependence on renal dialysis: Secondary | ICD-10-CM | POA: Diagnosis not present

## 2022-05-25 DIAGNOSIS — N186 End stage renal disease: Secondary | ICD-10-CM | POA: Diagnosis not present

## 2022-05-25 DIAGNOSIS — N2581 Secondary hyperparathyroidism of renal origin: Secondary | ICD-10-CM | POA: Diagnosis not present

## 2022-05-25 DIAGNOSIS — D631 Anemia in chronic kidney disease: Secondary | ICD-10-CM | POA: Diagnosis not present

## 2022-05-25 NOTE — Telephone Encounter (Signed)
Telephone encounter was:  Successful.  05/25/2022 Name: Jacob [name] MRN: [id number] DOB: 1951/04/01  Jacob [name] is a 71 y.o. year old male who is a primary care patient of [name], MD . The community resource team was consulted for assistance with Transportation Needs   Care guide performed the following interventions: Spoke with patient about transportation for 05/31/22 appoinment. Scheduled transportation through [employer], [place] [id number]. Ride is scheduled with [employer], pickup time is 10:45am.  Follow Up Plan:  I will schedule transportation for 8/22 & 8/31 appointments tomorrow.  Rohnan Bartleson, AAS Paralegal, [facility]  [address], [place] [address] ??[email or web address]  ?? [phone]   [email or web address]    05/25/2022  Jacob [name] DOB: 07-25-1951 MRN: [id number]   RIDER WAIVER AND RELEASE OF LIABILITY  For purposes of improving physical access to our facilities, [facility] is pleased to partner with third parties to provide [facility] patients or other authorized individuals the option of convenient, on-demand ground transportation services (the Technical brewer") through use of the technology service that enables users to request on-demand ground transportation from independent third-party providers.  By opting to use and accept these [employer], I, the undersigned, hereby agree on behalf of myself, and on behalf of any minor child using the Government social research officer for whom I am the parent or legal guardian, as follows:  Government social research officer provided to me are provided by independent third-party transportation providers who are not [employer] or employees and who are unaffiliated with [employer]. [facility] is neither a transportation carrier nor a common or public carrier. [facility] has no control over the quality or safety of the  transportation that occurs as a result of the [employer]. [facility] cannot guarantee that any third-party transportation provider will complete any arranged transportation service. [facility] makes no representation, warranty, or guarantee regarding the reliability, timeliness, quality, safety, suitability, or availability of any of the Transport Services or that they will be error free. I fully understand that traveling by vehicle involves risks and dangers of serious bodily injury, including permanent disability, paralysis, and death. I agree, on behalf of myself and on behalf of any minor child using the Transport Services for whom I am the parent or legal guardian, that the entire risk arising out of my use of the [employer] remains solely with me, to the maximum extent permitted under applicable law. The [employer] are provided "as is" and "as available." [facility] disclaims all representations and warranties, express, implied or statutory, not expressly set out in these terms, including the implied warranties of merchantability and fitness for a particular purpose. I hereby waive and release [facility], its agents, employees, officers, directors, representatives, insurers, attorneys, assigns, successors, subsidiaries, and affiliates from any and all past, present, or future claims, demands, liabilities, actions, causes of action, or suits of any kind directly or indirectly arising from acceptance and use of the [employer]. I further waive and release [facility] and its affiliates from all present and future liability and responsibility for any injury or death to persons or damages to property caused by or related to the use of the [employer]. I have read this Waiver and Release of Liability, and I understand the terms used in it and their legal significance. This Waiver is freely and voluntarily given with the understanding that my right (as well as the  right of any minor child for whom I am the parent or legal guardian using the [employer]) to legal recourse against [facility] in connection with the [employer] is knowingly surrendered in return for use of these services.   I attest that I read the Ride Waiver and Release of Liability to Jacob [name], gave Mr. Demma the opportunity to ask questions and answered the questions asked (if any). I affirm that Jacob [name] then provided consent for assistance with transportation.     Elani Delph D Andree Elk

## 2022-05-26 ENCOUNTER — Telehealth: Payer: Self-pay

## 2022-05-26 ENCOUNTER — Encounter (HOSPITAL_COMMUNITY)
Admission: RE | Admit: 2022-05-26 | Discharge: 2022-05-26 | Disposition: A | Discharge status: Home / Self Care | Payer: Medicare Other | Source: Ambulatory Visit | Attending: Gastroenterology | Admitting: Gastroenterology

## 2022-05-26 DIAGNOSIS — Q2730 Arteriovenous malformation, site unspecified: Secondary | ICD-10-CM | POA: Insufficient documentation

## 2022-05-26 MED ORDER — OCTREOTIDE ACETATE 10 MG IM KIT
10.0000 mg | PACK | INTRAMUSCULAR | Status: DC
  Administered 2022-05-26: 10 mg via INTRAMUSCULAR
  Filled 2022-05-26: qty 1

## 2022-05-26 NOTE — Telephone Encounter (Signed)
   Telephone encounter was:  Successful.  05/26/2022 Name: [name] MRN: [id number] DOB: Oct 04, 1951  Jacob [name] is a 71 y.o. year old male who is a primary care patient of [name], MD . The community resource team was consulted for assistance with Transportation Needs   Care guide performed the following interventions: Spoke with patient to give him information about transportation scheduled for his upcoming appointments and he will be contacted by the transportation vendors closer to the date of his appointments. Scheduled transportation with [employer]  for 06/14/22 appointment, rider id [phone], pickup time 9:05am.  Scheduled transportation with [employer] for 06/23/22 appointment, [place],  [place] [id number], pickup time is 11:33am. Patient stated that he uses [facility] transportation, but they are not always able to accomodate him.     Follow Up Plan:  No further follow up planned at this time. The patient has been provided with needed resources.  Leilanni Halvorson, AAS Paralegal, [facility] [employer]  [address], [place] [address] ??[email or web address]  ?? [phone]   [email or web address]

## 2022-05-27 DIAGNOSIS — N2581 Secondary hyperparathyroidism of renal origin: Secondary | ICD-10-CM | POA: Diagnosis not present

## 2022-05-27 DIAGNOSIS — Z992 Dependence on renal dialysis: Secondary | ICD-10-CM | POA: Diagnosis not present

## 2022-05-27 DIAGNOSIS — D631 Anemia in chronic kidney disease: Secondary | ICD-10-CM | POA: Diagnosis not present

## 2022-05-27 DIAGNOSIS — N186 End stage renal disease: Secondary | ICD-10-CM | POA: Diagnosis not present

## 2022-05-30 ENCOUNTER — Other Ambulatory Visit: Payer: Self-pay | Admitting: *Deleted

## 2022-05-30 DIAGNOSIS — Z992 Dependence on renal dialysis: Secondary | ICD-10-CM | POA: Diagnosis not present

## 2022-05-30 DIAGNOSIS — D631 Anemia in chronic kidney disease: Secondary | ICD-10-CM | POA: Diagnosis not present

## 2022-05-30 DIAGNOSIS — N186 End stage renal disease: Secondary | ICD-10-CM | POA: Diagnosis not present

## 2022-05-30 DIAGNOSIS — N2581 Secondary hyperparathyroidism of renal origin: Secondary | ICD-10-CM | POA: Diagnosis not present

## 2022-05-30 NOTE — Telephone Encounter (Signed)
Patient requested refill.  Epic LR: 05/17/2022 Contract Date: 07/20/2021 Pended Rx and sent to Dr. Sabra Heck for approval.

## 2022-05-31 ENCOUNTER — Encounter (HOSPITAL_BASED_OUTPATIENT_CLINIC_OR_DEPARTMENT_OTHER): Payer: Medicare Other | Attending: General Surgery | Admitting: General Surgery

## 2022-05-31 DIAGNOSIS — I132 Hypertensive heart and chronic kidney disease with heart failure and with stage 5 chronic kidney disease, or end stage renal disease: Secondary | ICD-10-CM | POA: Diagnosis not present

## 2022-05-31 DIAGNOSIS — E11621 Type 2 diabetes mellitus with foot ulcer: Secondary | ICD-10-CM | POA: Insufficient documentation

## 2022-05-31 DIAGNOSIS — N186 End stage renal disease: Secondary | ICD-10-CM | POA: Diagnosis not present

## 2022-05-31 DIAGNOSIS — E1139 Type 2 diabetes mellitus with other diabetic ophthalmic complication: Secondary | ICD-10-CM | POA: Diagnosis not present

## 2022-05-31 DIAGNOSIS — H42 Glaucoma in diseases classified elsewhere: Secondary | ICD-10-CM | POA: Diagnosis not present

## 2022-05-31 DIAGNOSIS — I5042 Chronic combined systolic (congestive) and diastolic (congestive) heart failure: Secondary | ICD-10-CM | POA: Insufficient documentation

## 2022-05-31 DIAGNOSIS — Z992 Dependence on renal dialysis: Secondary | ICD-10-CM | POA: Insufficient documentation

## 2022-05-31 DIAGNOSIS — J449 Chronic obstructive pulmonary disease, unspecified: Secondary | ICD-10-CM | POA: Insufficient documentation

## 2022-05-31 DIAGNOSIS — E1140 Type 2 diabetes mellitus with diabetic neuropathy, unspecified: Secondary | ICD-10-CM | POA: Insufficient documentation

## 2022-05-31 DIAGNOSIS — I2510 Atherosclerotic heart disease of native coronary artery without angina pectoris: Secondary | ICD-10-CM | POA: Diagnosis not present

## 2022-05-31 DIAGNOSIS — E1122 Type 2 diabetes mellitus with diabetic chronic kidney disease: Secondary | ICD-10-CM | POA: Diagnosis not present

## 2022-05-31 DIAGNOSIS — N2581 Secondary hyperparathyroidism of renal origin: Secondary | ICD-10-CM | POA: Insufficient documentation

## 2022-05-31 DIAGNOSIS — M109 Gout, unspecified: Secondary | ICD-10-CM | POA: Insufficient documentation

## 2022-05-31 DIAGNOSIS — L97512 Non-pressure chronic ulcer of other part of right foot with fat layer exposed: Secondary | ICD-10-CM | POA: Diagnosis not present

## 2022-05-31 DIAGNOSIS — I255 Ischemic cardiomyopathy: Secondary | ICD-10-CM | POA: Insufficient documentation

## 2022-05-31 DIAGNOSIS — H409 Unspecified glaucoma: Secondary | ICD-10-CM | POA: Diagnosis not present

## 2022-05-31 MED ORDER — OXYCODONE-ACETAMINOPHEN 5-325 MG PO TABS: 1.0000 | ORAL_TABLET | Freq: Four times a day (QID) | ORAL | 0 refills | Status: DC | PRN

## 2022-05-31 NOTE — Progress Notes (Signed)
[place], KORVER ([id number]) Visit Report for 05/31/2022 Chief Complaint Document Details Patient Name: Date of Service: Jacob [name], Jacob [name] 05/31/2022 11:15 A M Medical Record Number: [id number] Patient Account Number: [id number] Date of Birth/Sex: Treating RN: 05/06/1951 (71 y.o. Jacob [name] Primary Care Provider: Alain Honey Other Clinician: Referring Provider: Treating Provider/Extender: Neil Crouch in Treatment: 13 Information Obtained from: Patient Chief Complaint Patient seen for complaints of Non-Healing Wounds x 3 Electronic Signature(s) Signed: 05/31/2022 11:52:27 AM By: Fredirick Maudlin MD FACS Entered By: Fredirick Maudlin on 05/31/2022 11:52:27 -------------------------------------------------------------------------------- Debridement Details Patient Name: Date of Service: Jacob Ache. 05/31/2022 11:15 A M Medical Record Number: [id number] Patient Account Number: [id number] Date of Birth/Sex: Treating RN: Dec 09, 1950 (72 y.o. Jacob [name] Primary Care Provider: Alain Honey Other Clinician: Referring Provider: Treating Provider/Extender: Neil Crouch in Treatment: 13 Debridement Performed for Assessment: Wound #8 Right,Plantar Foot Performed By: Physician Fredirick Maudlin, MD Debridement Type: Debridement Severity of Tissue Pre Debridement: Fat layer exposed Level of Consciousness (Pre-procedure): Awake and Alert Pre-procedure Verification/Time Out Yes - 11:25 Taken: Start Time: 11:30 Pain Control: Lidocaine 4% T opical Solution T Area Debrided (L x W): otal 0.8 (cm) x 0.8 (cm) = 0.64 (cm) Tissue and other material debrided: Viable, Non-Viable, Eschar, Slough, Subcutaneous, Skin: Epidermis, Slough Level: Skin/Subcutaneous Tissue Debridement Description: Excisional Instrument: Curette Bleeding: Minimum Hemostasis Achieved: Pressure Procedural Pain: 0 Post Procedural Pain: 0 Response to  Treatment: Procedure was tolerated well Level of Consciousness (Post- Awake and Alert procedure): Post Debridement Measurements of Total Wound Length: (cm) 0.8 Width: (cm) 0.8 Depth: (cm) 0.6 Volume: (cm) 0.302 Character of Wound/Ulcer Post Debridement: Improved Severity of Tissue Post Debridement: Fat layer exposed Post Procedure Diagnosis Same as Pre-procedure Electronic Signature(s) Signed: 05/31/2022 12:29:09 PM By: Fredirick Maudlin MD FACS Signed: 05/31/2022 6:13:27 PM By: Baruch Gouty RN, BSN Entered By: Baruch Gouty on 05/31/2022 11:33:49 -------------------------------------------------------------------------------- HPI Details Patient Name: Date of Service: Jacob Katos R. 05/31/2022 11:15 A M Medical Record Number: [id number] Patient Account Number: [id number] Date of Birth/Sex: Treating RN: Aug 18, 1951 (71 y.o. Jacob [name] Primary Care Provider: Alain Honey Other Clinician: Referring Provider: Treating Provider/Extender: Neil Crouch in Treatment: 13 History of Present Illness Location: 2 ulcers noted to the left hallux Quality: the patient denies pain Duration: these were originally blisters towards the end of February and opened in early March Context: these wounds are secondary to pressure; he got new shoes in February and also started wearing a toe cover/sleeve Modifying Factors: pressure and friction are exacerbating factor HPI Description: 01/12/17- Mr. Stelle arrives for initial evaluation of 2 ulcers to his left hallux. He states that back in February he did receive a new pair of diabetic shoes and at that time he started wearing toe cover/sleeves and noticed development of a blister to the dorsal and plantar aspect. He has been seen at [facility] by both Dr. Amalia Hailey and Dr. Jacqualyn Posey. He last saw Dr.Wagoner on 3/8 at which time he was prescribed Silvadene and Keflex. He completed that dose of Keflex and states that he  got a phone call to extend the Keflex prior to this appointment. He has a follow-up with Dr. Amalia Hailey on 3/26. He has been wearing a surgical shoe, no significant offloading to the plantar/medial ulcer but the strap does not extend over the dorsal ulcer. Topically he has been applying the Silvadene daily and a dry dressing. He is  a diabetic, on insulin, with a recent A1c of 6.2% (11/10/16). He is a current smoker, 0.5 PPD. He has no known diagnosis of arterial insufficiency. 01/20/17- The patient arrives for follow up evaluation of his ulcers to the left hallux. He admits to continuing to smoke approximately 0.5ppd. He states that his diet has been somewhat uncontrolled with higher than normal glucose levels, this seems circumstantial as he had a death in the family as food and schedule has been disrupted. he admits to wearing the surgical show with offloading modifications while out of the home, but has been ambulating barefoot around the home. he saw Dr. Amalia Hailey yesterday for toenail trimming and callus shaving to the right plantar foot and investigation of his ulcers. 01/26/17- he is here for follow-up evaluation of his left hallux ulcers. He states he has been significantly more compliant in the use of his offloading surgical shoe, with the exception of "getting up for a glass of water" he states that he has worn it with any ambulation. He states he has decreased his smoking too, admitting to only a cigarette since yesterday. He states his blood sugar this morning was 147 and yesterday was 116. He is voicing no complaints or concerns 02/02/17- He is here for follow-up evaluation of his left hallux ulcers. The dorsal ulcer is crusted over/healed. He states he has minimized his ambulation over this past week, much less than the previous week. He states his glucose levels have been between 120-150 about half of the time, otherwise greater than 150. He cannot correlate any specific dietary changes. He does have  an appointment with a dietitian next month. He was advised to keep a log of his glucose levels and his diet for that appointment. 02/06/17; patient comes in today for first application of the total contact cast. Her intake nurse reported the wounds look better therefore I did not actually see these before application of the cast. He'll return on Thursday for reapplication in the standard fashion 02/10/27- he is here for follow-up evaluation of his left plantar hallux ulcer. He came in Monday for application of a total contact cast. He states that he was tolerating that Monday, but noticed on Tuesday that his foot began to slide in the cast. He also is complaining of the heaviness of the cast, as he ambulates as his primary form of transportation. He continues to smoke,. He has not contacted his cardiologist regarding Nicorette gum. He states his blood sugars have been slightly elevated, this morning being 206. He states that he will complete an antibiotic tonight. He states this was prescribed by Dr. Jacqualyn Posey. He states that he has refilled this prescription twice, without direction to continue taking this medication. 02/16/17; patient wound about the same as last week per her description. Rolled edges of callus scan and nonviable subcutaneous tissue. Using Prisma. Apparently used topical antibiotic instead of the Prisma over the course of this week. He has not a total contact cast candidate secondary to ambulation needs and unsteadiness on his feet per discussion with our intake nurse 03/02/17 patient requires debridement. He has a small wound on the right great toe which probes however does not approach bone. What I can see of the base of this wound looks reasonably healthy and I think this is largely in offloading issue with the second toe crossing over the first. He did not tolerate a total contact cast has balance issues 5/171/8. 0.2x0.2x0.2 less depth. Using prisma 03/16/17; no major change. Still a  small  probing wound with copious amounts of surrounding callus skin nonviable subcutaneous tissue all of which debrided. Still using Prisma. I suspect the dimensions post debridement are about the same as listed above 0.2 x 0.2 x 0.2 03/23/17; no major change using Prisma. Once again a large amount of callus nonviable subcutaneous tissue around the wound. I went over the offloading issues with the patient and the nurse. He did not tolerate a total contact cast he uses public transportation among other issues. 03/30/17; no major change using Prisma. Once again a large amount of callus and nonviable tissue around the wound requires debridement. Saw Dr. Amalia Hailey who pared calluses on the other foot. 04/06/17; the wound is much smaller there is callus around this although I think I can see the base of the wound and I elected not to do any debridement. 04/13/17 this is a wound I did not debridement last week left callus intact. He arrives today with the area much in the same predicament however this time I remove the callus to find a deep probing hole. This is disappointing this is not go to bone. We have been using Prisma. 04/20/17 plantar left first toe. Again although a lot of circumferential callus around the wound which requires debridement however this does not appear to be a probing area. Culture I did last week show coag-negative staph. I thought this was probably not something that was worth treating but In the interim he was seen by his primary care with parotitis. He is now on clindamycin and Cipro and the Staphylococcus we cultured is Cipro sensitive [lugdunensis} 04/27/17; plantar left first toe. Again a lot of circumferential callus around a small open area. Removing the callus also nonviable subcutaneous tissue. The wound looks better today. He has completed his antibiotics for the parotitis. I didn't think additional antibiotics were necessary in terms of the wound 05/11/17; plantar first toe. I thought  this might be closed this week however once again he has a deep probing small hole. Using pickups and a scalpel debridement of thick callus and subcutaneous tissue from around the wound reveals a depth of about 0.4 cm. We have not been able to offload the area. There is no obvious infection here. No evidence of ischemia 05/18/17; since his last visit he's been in hospital for oral surgery apparently to remove a retained surgical screw. He has swelling of his jaw and has some discomfort. He arrives today with the plantar left first toe in the same predicament. No major change. Each time he comes in here he has thick adherent callus 05/25/17; arrives today with roughly the same condition of the plantar left great toe. Perhaps somewhat better less callus. There are no options for offloading. He is on his feet a lot and I think this is largely a pressure issue X-ray I did last week showed no evidence of osteomyelitis. He has a toe separator as the great toe tends to ride under the second toe when he is walking. I'm hopeful that this will allow for a little less pressure on the plantar aspect of the left great toe 06/07/2017 -- he is working hard on giving up smoking and other than that has been wearing his diabetic shoes and is also trying to offload as much as possible. 06/15/17; patient arrived in clinic today with a difficult area on his left medial great toe closed over. There is still amount of callus on this however I don't believe there is any underlying open wound. He has  custom shoes with inserts. I still think he is going to need to offload this area going forward. He also follows with Dr. Amalia Hailey of podiatry for routine foot care READMISSION 11/01/16; patient arrives in clinic today with a five-day history of a new area on the previously problematic left great toe. He is wearing his diabetic shoes and he apparently has a new pair coming. The patient is a type II diabetic with neuropathy. He follows  with Dr. Amalia Hailey of podiatry for pre-ulcerative callus on his bilateral feet last seen on 08/24/17. He has developed a large blister on top of his left great toe this is since gone down somewhat. He is not been dressing this specifically. He does not have a history of significant PAD previous ABIs in this clinic were1.1 11/06/17; left great toe has epithelialized since last time. I think this was caused by a insoles an issue pushing the toe against the dorsal aspect of the shoe itself has new diabetic shoes coming hopefully will have more forefoot depth. READMISSION 03/01/2022 This is a now 71 year old man with a past medical history significant for type 2 diabetes mellitus with end-stage renal disease on hemodialysis as as well as congestive heart failure, ischemic cardiomyopathy, hypertension, and prior diabetic foot ulcers. He has had multiple issues with his hemodialysis access. He had a prior left radiocephalic fistula that failed. In October 2020, he had a left brachiobasilic fistula. He apparently underwent a procedure at an outside vascular office on November 09, 2021. Subsequent to that, he developed a wound on his left posterior forearm just distal to the elbow as well as wounds on 2 of his left digits. He was diagnosed with steal syndrome and recently underwent ligation of his fistula with placement of a hemodialysis catheter. He is here today for assistance in healing the wounds that developed related to steal syndrome. He says that he has been applying Neosporin to the sites and they are fairly sensitive to the touch. 03/08/2022: The wounds on his fingers are much smaller and have just a little bit of accumulated thin eschar. The wound on his left posterior forearm is also smaller but has accumulated both slough and eschar. He also has a thick callus on his right fifth metatarsal head plantar surface and unfortunately, it appears that there is an open wound underneath the callus. We have been  using Iodosorb with Hydrofera Blue to all of his open wounds. Pain is much improved from last week. 03/15/2022: The wounds on his fingers have healed. The wound on his left posterior forearm is smaller and has a nice layer of granulation tissue with just a bit of accumulated slough. He has built up additional callus around the right fifth metatarsal head plantar surface, but the underlying wound has a good surface with granulation tissue present. We have been using Iodosorb with Hydrofera Blue. He did not endorse any pain this week. 03/22/2022: The wound on his left posterior forearm continues to contract. There is good granulation tissue present and just a little bit of periwound eschar and overlying slough. The right fifth metatarsal head wound has once again accumulated some callus and there is slough within the wound. The intake nurse measured a area of undermining from 7-10 o'clock. 03/29/2022: The patient saw podiatry this week for nail trim and they also pared back a number of calluses. The wound on his left posterior forearm is substantially smaller with just some overlying thin eschar. The right fifth metatarsal head has a bit of callus  accumulation as well as some slough in the wound bed but is otherwise clean. It is smaller today, as well. 04/05/2022: The wound on his left posterior forearm is nearly closed with just a small amount of overlying eschar. The right fifth metatarsal head wound is smaller again this week. There is some periwound callus accumulation and some slough in the wound bed. He is going to get new dialysis access placed next week. 04/19/2022: The wound on his left posterior forearm is closed. The right fifth metatarsal head wound is about the same size with periwound callus and slough accumulation. He did have a new right forearm PTFE AV graft placed last week. His right forearm and hand are quite swollen, warm, and red today. He was apparently seen in the emergency room  yesterday and they did not think it was infected and attributed the swelling to normal postop findings. 04/28/2022: The wound on his foot has accumulated a lot of periwound callus, but the actual open surface is quite small. There is just a little bit of slough on the surface. 05/03/2022: His wound is even smaller today with less periwound callus accumulation. He continues to build up slough but the underlying tissue is healthy and viable-appearing. 05/10/2022: The orifice of the wound continues to contract, but there is some undermining and periwound callus formation. No significant accumulation of slough. 05/17/2022: The wound is about the same size today, perhaps slightly smaller. He does have periwound callus accumulation, but it does not overlap or create any undermining. There is some slough in the base of the wound. 05/31/2022: The wound measured larger today on intake, but this may be secondary to the fairly aggressive debridement I performed at his last visit. Despite this, he has built up callus around the wound again. There is some epiboly around the orifice. There is slough within the wound and some undermining is present. His blood sugar remains very poorly controlled; it was 300 today. Electronic Signature(s) Signed: 05/31/2022 11:53:32 AM By: Fredirick Maudlin MD FACS Entered By: Fredirick Maudlin on 05/31/2022 11:53:32 -------------------------------------------------------------------------------- Physical Exam Details Patient Name: Date of Service: Jacob Ache. 05/31/2022 11:15 A M Medical Record Number: [id number] Patient Account Number: [id number] Date of Birth/Sex: Treating RN: May 18, 1951 (71 y.o. Jacob [name] Primary Care Provider: Alain Honey Other Clinician: Referring Provider: Treating Provider/Extender: Neil Crouch in Treatment: 13 Constitutional . . . . No acute distress.[name] Respiratory Normal work of breathing on room  air.. Notes 05/31/2022: The wound measured larger today on intake, but this may be secondary to the fairly aggressive debridement I performed at his last visit. Despite this, he has built up callus around the wound again. There is some epibole around the orifice. There is slough within the wound and some undermining is present. Electronic Signature(s) Signed: 05/31/2022 11:54:10 AM By: Fredirick Maudlin MD FACS Entered By: Fredirick Maudlin on 05/31/2022 11:54:10 -------------------------------------------------------------------------------- Physician Orders Details Patient Name: Date of Service: Jacob Ache. 05/31/2022 11:15 A M Medical Record Number: [id number] Patient Account Number: [id number] Date of Birth/Sex: Treating RN: 01/09/1951 (71 y.o. Jacob [name] Primary Care Provider: Alain Honey Other Clinician: Referring Provider: Treating Provider/Extender: Neil Crouch in Treatment: [phone] Verbal / Phone Orders: No Diagnosis Coding ICD-10 Coding Code Description [id number] Non-pressure chronic ulcer of other part of right foot with fat layer exposed E11.22 Type 2 diabetes mellitus with diabetic chronic kidney disease J44.9 Chronic obstructive pulmonary disease, unspecified I50.42 Chronic combined systolic (  congestive) and diastolic (congestive) heart failure I10 Essential (primary) hypertension N25.81 Secondary hyperparathyroidism of renal origin N18.6 End stage renal disease B20 Human immunodeficiency virus [HIV] disease Follow-up Appointments ppointment in 2 weeks. - Dr. Celine Ahr RM 1 Return A Thursday 8/24 @ 10:30 am Bathing/ Shower/ Hygiene Other Bathing/Shower/Hygiene Orders/Instructions: - Change dressing after bathing Off-Loading Other: - wear custom diabetic shoes at al times when walking Wound Treatment Wound #8 - Foot Wound Laterality: Plantar, Right Cleanser: Soap and Water Every Other Day/7 Days Discharge Instructions: May shower and  wash wound with dial antibacterial soap and water prior to dressing change. Cleanser: Wound Cleanser (Generic) Every Other Day/7 Days Discharge Instructions: Cleanse the wound with wound cleanser prior to applying a clean dressing using gauze sponges, not tissue or cotton balls. Prim Dressing: Promogran Prisma Matrix, 4.34 (sq in) (silver collagen) Every Other Day/7 Days ary Discharge Instructions: Moisten collagen with saline or hydrogel Secondary Dressing: Drawtex 4x4 in Every Other Day/7 Days Discharge Instructions: Apply over primary dressing cut to fit into wound bed behind collagen Secondary Dressing: Optifoam Non-Adhesive Dressing, 4x4 in Every Other Day/7 Days Discharge Instructions: Apply over primary dressing cut to make foam donut Secondary Dressing: Woven Gauze Sponges 2x2 in Every Other Day/7 Days Discharge Instructions: Apply over primary dressing as directed. Secured With: 73M Medipore H Soft Cloth Surgical T ape, 4 x 10 (in/yd) Every Other Day/7 Days Discharge Instructions: Secure with tape as directed. Patient Medications llergies: Amphetamine Analogues A Notifications Medication Indication Start End prior to debridement 05/31/2022 lidocaine DOSE topical 4 % cream - cream topical Electronic Signature(s) Signed: 05/31/2022 12:29:09 PM By: Fredirick Maudlin MD FACS Entered By: Fredirick Maudlin on 05/31/2022 11:54:24 -------------------------------------------------------------------------------- Problem List Details Patient Name: Date of Service: Jacob Ache. 05/31/2022 11:15 A M Medical Record Number: [id number] Patient Account Number: [id number] Date of Birth/Sex: Treating RN: 1951-07-26 (71 y.o. Jacob [name] Primary Care Provider: Alain Honey Other Clinician: Referring Provider: Treating Provider/Extender: Neil Crouch in Treatment: 13 Active Problems ICD-10 Encounter Code Description Active Date MDM Diagnosis [phone]  Non-pressure chronic ulcer of other part of right foot with fat layer exposed 03/08/2022 No Yes E11.22 Type 2 diabetes mellitus with diabetic chronic kidney disease 03/01/2022 No Yes J44.9 Chronic obstructive pulmonary disease, unspecified 03/01/2022 No Yes I50.42 Chronic combined systolic (congestive) and diastolic (congestive) heart failure 03/01/2022 No Yes I10 Essential (primary) hypertension 03/01/2022 No Yes N25.81 Secondary hyperparathyroidism of renal origin 03/01/2022 No Yes N18.6 End stage renal disease 03/01/2022 No Yes B20 Human immunodeficiency virus [HIV] disease 03/01/2022 No Yes Inactive Problems Resolved Problems ICD-10 Code Description Active Date Resolved Date L98.499 Non-pressure chronic ulcer of skin of other sites with unspecified severity 03/01/2022 03/01/2022 Electronic Signature(s) Signed: 05/31/2022 11:52:12 AM By: Fredirick Maudlin MD FACS Entered By: Fredirick Maudlin on 05/31/2022 11:52:12 -------------------------------------------------------------------------------- Progress Note Details Patient Name: Date of Service: Jacob Katos R. 05/31/2022 11:15 A M Medical Record Number: [id number] Patient Account Number: [id number] Date of Birth/Sex: Treating RN: [birth year] (71 y.o. Jacob [name] Primary Care Provider: Alain Honey Other Clinician: Referring Provider: Treating Provider/Extender: Neil Crouch in Treatment: 13 Subjective Chief Complaint Information obtained from Patient Patient seen for complaints of Non-Healing Wounds x 3 History of Present Illness (HPI) The following HPI elements were documented for the patient's wound: Location: 2 ulcers noted to the left hallux Quality: the patient denies pain Duration: these were originally blisters towards the end of February and opened in early March  Context: these wounds are secondary to pressure; he got new shoes in February and also started wearing a toe cover/sleeve Modifying Factors:  pressure and friction are exacerbating factor 01/12/17- Mr. Manfred arrives for initial evaluation of 2 ulcers to his left hallux. He states that back in February he did receive a new pair of diabetic shoes and at that time he started wearing toe cover/sleeves and noticed development of a blister to the dorsal and plantar aspect. He has been seen at [facility] by both Dr. Amalia Hailey and Dr. Jacqualyn Posey. He last saw Dr.Wagoner on 3/8 at which time he was prescribed Silvadene and Keflex. He completed that dose of Keflex and states that he got a phone call to extend the Keflex prior to this appointment. He has a follow-up with Dr. Amalia Hailey on 3/26. He has been wearing a surgical shoe, no significant offloading to the plantar/medial ulcer but the strap does not extend over the dorsal ulcer. T opically he has been applying the Silvadene daily and a dry dressing. He is a diabetic, on insulin, with a recent A1c of 6.2% (11/10/16). He is a current smoker, 0.5 PPD. He has no known diagnosis of arterial insufficiency. 01/20/17- The patient arrives for follow up evaluation of his ulcers to the left hallux. He admits to continuing to smoke approximately 0.5ppd. He states that his diet has been somewhat uncontrolled with higher than normal glucose levels, this seems circumstantial as he had a death in the family as food and schedule has been disrupted. he admits to wearing the surgical show with offloading modifications while out of the home, but has been ambulating barefoot around the home. he saw Dr. Amalia Hailey yesterday for toenail trimming and callus shaving to the right plantar foot and investigation of his ulcers. 01/26/17- he is here for follow-up evaluation of his left hallux ulcers. He states he has been significantly more compliant in the use of his offloading surgical shoe, with the exception of "getting up for a glass of water" he states that he has worn it with any ambulation. He states he has decreased his smoking  too, admitting to only a cigarette since yesterday. He states his blood sugar this morning was 147 and yesterday was 116. He is voicing no complaints or concerns 02/02/17- He is here for follow-up evaluation of his left hallux ulcers. The dorsal ulcer is crusted over/healed. He states he has minimized his ambulation over this past week, much less than the previous week. He states his glucose levels have been between 120-150 about half of the time, otherwise greater than 150. He cannot correlate any specific dietary changes. He does have an appointment with a dietitian next month. He was advised to keep a log of his glucose levels and his diet for that appointment. 02/06/17; patient comes in today for first application of the total contact cast. Her intake nurse reported the wounds look better therefore I did not actually see these before application of the cast. He'll return on Thursday for reapplication in the standard fashion 02/10/27- he is here for follow-up evaluation of his left plantar hallux ulcer. He came in Monday for application of a total contact cast. He states that he was tolerating that Monday, but noticed on Tuesday that his foot began to slide in the cast. He also is complaining of the heaviness of the cast, as he ambulates as his primary form of transportation. He continues to smoke,. He has not contacted his cardiologist regarding Nicorette gum. He states his  blood sugars have been slightly elevated, this morning being 206. He states that he will complete an antibiotic tonight. He states this was prescribed by Dr. Jacqualyn Posey. He states that he has refilled this prescription twice, without direction to continue taking this medication. 02/16/17; patient wound about the same as last week per her description. Rolled edges of callus scan and nonviable subcutaneous tissue. Using Prisma. Apparently used topical antibiotic instead of the Prisma over the course of this week. He has not a total  contact cast candidate secondary to ambulation needs and unsteadiness on his feet per discussion with our intake nurse 03/02/17 patient requires debridement. He has a small wound on the right great toe which probes however does not approach bone. What I can see of the base of this wound looks reasonably healthy and I think this is largely in offloading issue with the second toe crossing over the first. He did not tolerate a total contact cast has balance issues 5/171/8. 0.2x0.2x0.2 less depth. Using prisma 03/16/17; no major change. Still a small probing wound with copious amounts of surrounding callus skin nonviable subcutaneous tissue all of which debrided. Still using Prisma. I suspect the dimensions post debridement are about the same as listed above 0.2 x 0.2 x 0.2 03/23/17; no major change using Prisma. Once again a large amount of callus nonviable subcutaneous tissue around the wound. I went over the offloading issues with the patient and the nurse. He did not tolerate a total contact cast he uses public transportation among other issues. 03/30/17; no major change using Prisma. Once again a large amount of callus and nonviable tissue around the wound requires debridement. Saw Dr. Amalia Hailey who pared calluses on the other foot. 04/06/17; the wound is much smaller there is callus around this although I think I can see the base of the wound and I elected not to do any debridement. 04/13/17 this is a wound I did not debridement last week left callus intact. He arrives today with the area much in the same predicament however this time I remove the callus to find a deep probing hole. This is disappointing this is not go to bone. We have been using Prisma. 04/20/17 plantar left first toe. Again although a lot of circumferential callus around the wound which requires debridement however this does not appear to be a probing area. Culture I did last week show coag-negative staph. I thought this was probably not  something that was worth treating but In the interim he was seen by his primary care with parotitis. He is now on clindamycin and Cipro and the Staphylococcus we cultured is Cipro sensitive [lugdunensis} 04/27/17; plantar left first toe. Again a lot of circumferential callus around a small open area. Removing the callus also nonviable subcutaneous tissue. The wound looks better today. He has completed his antibiotics for the parotitis. I didn't think additional antibiotics were necessary in terms of the wound 05/11/17; plantar first toe. I thought this might be closed this week however once again he has a deep probing small hole. Using pickups and a scalpel debridement of thick callus and subcutaneous tissue from around the wound reveals a depth of about 0.4 cm. We have not been able to offload the area. There is no obvious infection here. No evidence of ischemia 05/18/17; since his last visit he's been in hospital for oral surgery apparently to remove a retained surgical screw. He has swelling of his jaw and has some discomfort. He arrives today with the plantar left  first toe in the same predicament. No major change. Each time he comes in here he has thick adherent callus 05/25/17; arrives today with roughly the same condition of the plantar left great toe. Perhaps somewhat better less callus. There are no options for offloading. He is on his feet a lot and I think this is largely a pressure issue X-ray I did last week showed no evidence of osteomyelitis. He has a toe separator as the great toe tends to ride under the second toe when he is walking. I'm hopeful that this will allow for a little less pressure on the plantar aspect of the left great toe 06/07/2017 -- he is working hard on giving up smoking and other than that has been wearing his diabetic shoes and is also trying to offload as much as possible. 06/15/17; patient arrived in clinic today with a difficult area on his left medial great toe closed  over. There is still amount of callus on this however I don't believe there is any underlying open wound. He has custom shoes with inserts. I still think he is going to need to offload this area going forward. He also follows with Dr. Amalia Hailey of podiatry for routine foot care READMISSION 11/01/16; patient arrives in clinic today with a five-day history of a new area on the previously problematic left great toe. He is wearing his diabetic shoes and he apparently has a new pair coming. The patient is a type II diabetic with neuropathy. He follows with Dr. Amalia Hailey of podiatry for pre-ulcerative callus on his bilateral feet last seen on 08/24/17. He has developed a large blister on top of his left great toe this is since gone down somewhat. He is not been dressing this specifically. He does not have a history of significant PAD previous ABIs in this clinic were1.1 11/06/17; left great toe has epithelialized since last time. I think this was caused by a insoles an issue pushing the toe against the dorsal aspect of the shoe itself has new diabetic shoes coming hopefully will have more forefoot depth. READMISSION 03/01/2022 This is a now 71 year old man with a past medical history significant for type 2 diabetes mellitus with end-stage renal disease on hemodialysis as as well as congestive heart failure, ischemic cardiomyopathy, hypertension, and prior diabetic foot ulcers. He has had multiple issues with his hemodialysis access. He had a prior left radiocephalic fistula that failed. In October 2020, he had a left brachiobasilic fistula. He apparently underwent a procedure at an outside vascular office on November 09, 2021. Subsequent to that, he developed a wound on his left posterior forearm just distal to the elbow as well as wounds on 2 of his left digits. He was diagnosed with steal syndrome and recently underwent ligation of his fistula with placement of a hemodialysis catheter. He is here today for assistance  in healing the wounds that developed related to steal syndrome. He says that he has been applying Neosporin to the sites and they are fairly sensitive to the touch. 03/08/2022: The wounds on his fingers are much smaller and have just a little bit of accumulated thin eschar. The wound on his left posterior forearm is also smaller but has accumulated both slough and eschar. He also has a thick callus on his right fifth metatarsal head plantar surface and unfortunately, it appears that there is an open wound underneath the callus. We have been using Iodosorb with Hydrofera Blue to all of his open wounds. Pain is much improved from  last week. 03/15/2022: The wounds on his fingers have healed. The wound on his left posterior forearm is smaller and has a nice layer of granulation tissue with just a bit of accumulated slough. He has built up additional callus around the right fifth metatarsal head plantar surface, but the underlying wound has a good surface with granulation tissue present. We have been using Iodosorb with Hydrofera Blue. He did not endorse any pain this week. 03/22/2022: The wound on his left posterior forearm continues to contract. There is good granulation tissue present and just a little bit of periwound eschar and overlying slough. The right fifth metatarsal head wound has once again accumulated some callus and there is slough within the wound. The intake nurse measured a area of undermining from 7-10 o'clock. 03/29/2022: The patient saw podiatry this week for nail trim and they also pared back a number of calluses. The wound on his left posterior forearm is substantially smaller with just some overlying thin eschar. The right fifth metatarsal head has a bit of callus accumulation as well as some slough in the wound bed but is otherwise clean. It is smaller today, as well. 04/05/2022: The wound on his left posterior forearm is nearly closed with just a small amount of overlying eschar. The  right fifth metatarsal head wound is smaller again this week. There is some periwound callus accumulation and some slough in the wound bed. He is going to get new dialysis access placed next week. 04/19/2022: The wound on his left posterior forearm is closed. The right fifth metatarsal head wound is about the same size with periwound callus and slough accumulation. He did have a new right forearm PTFE AV graft placed last week. His right forearm and hand are quite swollen, warm, and red today. He was apparently seen in the emergency room yesterday and they did not think it was infected and attributed the swelling to normal postop findings. 04/28/2022: The wound on his foot has accumulated a lot of periwound callus, but the actual open surface is quite small. There is just a little bit of slough on the surface. 05/03/2022: His wound is even smaller today with less periwound callus accumulation. He continues to build up slough but the underlying tissue is healthy and viable-appearing. 05/10/2022: The orifice of the wound continues to contract, but there is some undermining and periwound callus formation. No significant accumulation of slough. 05/17/2022: The wound is about the same size today, perhaps slightly smaller. He does have periwound callus accumulation, but it does not overlap or create any undermining. There is some slough in the base of the wound. 05/31/2022: The wound measured larger today on intake, but this may be secondary to the fairly aggressive debridement I performed at his last visit. Despite this, he has built up callus around the wound again. There is some epiboly around the orifice. There is slough within the wound and some undermining is present. His blood sugar remains very poorly controlled; it was 300 today. Patient History Information obtained from Patient. Family History Diabetes - Siblings,Father, Heart Disease - Father,Siblings, Hypertension - Father,Siblings, No family  history of Cancer, Hereditary Spherocytosis, Kidney Disease, Lung Disease, Seizures, Stroke, Thyroid Problems, Tuberculosis. Social History Current every day smoker - 1/2 ppd / 64yr, Marital Status - Single, Alcohol Use - Moderate - 6 standard drinks per week, Drug Use - Prior History - Polysubstance Abuse; clean for 9 years, Caffeine Use - Rarely - tea. Medical History Eyes Patient has history of Cataracts -  mild, Glaucoma Denies history of Optic Neuritis Ear/Nose/Mouth/Throat Denies history of Chronic sinus problems/congestion, Middle ear problems Hematologic/Lymphatic Patient has history of Anemia Denies history of Hemophilia, Human Immunodeficiency Virus, Lymphedema, Sickle Cell Disease Respiratory Patient has history of Chronic Obstructive Pulmonary Disease (COPD) Denies history of Aspiration, Asthma, Pneumothorax, Sleep Apnea, Tuberculosis Cardiovascular Patient has history of Congestive Heart Failure, Coronary Artery Disease, Hypertension, Myocardial Infarction - 2004 Denies history of Angina, Arrhythmia, Deep Vein Thrombosis, Hypotension, Peripheral Arterial Disease, Peripheral Venous Disease, Phlebitis, Vasculitis Gastrointestinal Patient has history of Hepatitis C - 1968 Denies history of Cirrhosis , Colitis, Crohnoos, Hepatitis A, Hepatitis B Endocrine Patient has history of Type II Diabetes Denies history of Type I Diabetes Genitourinary Denies history of End Stage Renal Disease Immunological Denies history of Lupus Erythematosus, Raynaudoos, Scleroderma Integumentary (Skin) Denies history of History of Burn Musculoskeletal Patient has history of Gout, Osteoarthritis Denies history of Rheumatoid Arthritis, Osteomyelitis Neurologic Patient has history of Neuropathy Denies history of Dementia, Quadriplegia, Paraplegia, Seizure Disorder Oncologic Denies history of Received Chemotherapy, Received Radiation Psychiatric Denies history of Anorexia/bulimia, Confinement  Anxiety Hospitalization/Surgery History - Colonoscopy. - CABG. - Laproscopic Cholecystectomy. - Left Hip Fx. - Left Jaw Fx. - Lumbar Laminectomy. Medical A Surgical History Notes nd Constitutional Symptoms (General Health) Insomnia Syncope Alcohol Dependence HIV Hematologic/Lymphatic Hyperlipidemia Hyperglycemia Cardiovascular CABG, ischemic cardiomyopathy Gastrointestinal GERD Genitourinary Syphilis CKD,Nocturia Immunological HIV Musculoskeletal Hip Fx BiLateral Bunions Unspecified Arthritis Neurologic chronic back pain Objective Constitutional No acute distress.. Vitals Time Taken: 11:17 AM, Height: 74 in, Weight: 172 lbs, BMI: 22.1, Temperature: 98.4 F, Pulse: 76 bpm, Respiratory Rate: 18 breaths/min, Blood Pressure: 131/62 mmHg, Capillary Blood Glucose: 300 mg/dl. General Notes: glucose per pt report this am Respiratory Normal work of breathing on room air.. General Notes: 05/31/2022: The wound measured larger today on intake, but this may be secondary to the fairly aggressive debridement I performed at his last visit. Despite this, he has built up callus around the wound again. There is some epibole around the orifice. There is slough within the wound and some undermining is present. Integumentary (Hair, Skin) Wound #8 status is Open. Original cause of wound was Blister. The date acquired was: 03/08/2022. The wound has been in treatment 12 weeks. The wound is located on the [place]. The wound measures 0.6cm length x 0.6cm width x 0.6cm depth; 0.283cm^2 area and 0.17cm^3 volume. There is Fat Layer (Subcutaneous Tissue) exposed. There is no tunneling noted, however, there is undermining starting at 5:00 and ending at 7:00 with a maximum distance of 0.4cm. There is a small amount of serosanguineous drainage noted. The wound margin is thickened. There is large (67-100%) red granulation within the wound bed. There is a small (1-33%) amount of necrotic tissue within the  wound bed including Adherent Slough. 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 Chronic obstructive pulmonary disease, unspecified Chronic combined systolic (congestive) and diastolic (congestive) heart failure Essential (primary) hypertension Secondary hyperparathyroidism of renal origin End stage renal disease Human immunodeficiency virus [HIV] disease Procedures Wound #8 Pre-procedure diagnosis of Wound #8 is a Diabetic Wound/Ulcer of the Lower Extremity located on the Right,Plantar Foot .Severity of Tissue Pre Debridement is: Fat layer exposed. There was a Excisional Skin/Subcutaneous Tissue Debridement with a total area of 0.64 sq cm performed by Fredirick Maudlin, MD. With the following instrument(s): Curette to remove Viable and Non-Viable tissue/material. Material removed includes Eschar, Subcutaneous Tissue,  Slough, and Skin: Epidermis after achieving pain control using Lidocaine 4% Topical Solution. No specimens were taken. A time out was conducted at 11:25, prior to the start of the procedure. A Minimum amount of bleeding was controlled with Pressure. The procedure was tolerated well with a pain level of 0 throughout and a pain level of 0 following the procedure. Post Debridement Measurements: 0.8cm length x 0.8cm width x 0.6cm depth; 0.302cm^3 volume. Character of Wound/Ulcer Post Debridement is improved. Severity of Tissue Post Debridement is: Fat layer exposed. Post procedure Diagnosis Wound #8: Same as Pre-Procedure Plan Follow-up Appointments: Return Appointment in 2 weeks. - Dr. Celine Ahr RM 1 Thursday 8/24 @ 10:30 am Bathing/ Shower/ Hygiene: Other Bathing/Shower/Hygiene Orders/Instructions: - Change dressing after bathing Off-Loading: Other: - wear custom diabetic shoes at al times when walking The following medication(s) was prescribed:  lidocaine topical 4 % cream cream topical for prior to debridement was prescribed at facility WOUND #8: - Foot Wound Laterality: Plantar, Right Cleanser: Soap and Water Every Other Day/7 Days Discharge Instructions: May shower and wash wound with dial antibacterial soap and water prior to dressing change. Cleanser: Wound Cleanser (Generic) Every Other Day/7 Days Discharge Instructions: Cleanse the wound with wound cleanser prior to applying a clean dressing using gauze sponges, not tissue or cotton balls. Prim Dressing: Promogran Prisma Matrix, 4.34 (sq in) (silver collagen) Every Other Day/7 Days ary Discharge Instructions: Moisten collagen with saline or hydrogel Secondary Dressing: Drawtex 4x4 in Every Other Day/7 Days Discharge Instructions: Apply over primary dressing cut to fit into wound bed behind collagen Secondary Dressing: Optifoam Non-Adhesive Dressing, 4x4 in Every Other Day/7 Days Discharge Instructions: Apply over primary dressing cut to make foam donut Secondary Dressing: Woven Gauze Sponges 2x2 in Every Other Day/7 Days Discharge Instructions: Apply over primary dressing as directed. Secured With: 34M Medipore H Soft Cloth Surgical T ape, 4 x 10 (in/yd) Every Other Day/7 Days Discharge Instructions: Secure with tape as directed. 05/31/2022: The wound measured larger today on intake, but this may be secondary to the fairly aggressive debridement I performed at his last visit. Despite this, he has built up callus around the wound again. There is some epiboly around the orifice. There is slough within the wound and some undermining is present. I used a curette to debride the callus from around the wound. I also debrided slough and nonviable subcutaneous tissue from the wound bed. I pared back the undermined portion of the wound along with the epiboly. We have been using silver alginate for about 6 weeks with limited improvement. Given that the wound is quite clean, I am going to  switch to Prisma silver collagen. We will use drawtex as a backing. Follow-up in 2 weeks. Electronic Signature(s) Signed: 05/31/2022 11:55:13 AM By: Fredirick Maudlin MD FACS Entered By: Fredirick Maudlin on 05/31/2022 11:55:13 -------------------------------------------------------------------------------- HxROS Details Patient Name: Date of Service: Jacob Ache. 05/31/2022 11:15 A M Medical Record Number: [id number] Patient Account Number: [id number] Date of Birth/Sex: Treating RN: [birth year] (71 y.o. Jacob [name] Primary Care Provider: Alain Honey Other Clinician: Referring Provider: Treating Provider/Extender: Neil Crouch in Treatment: 13 Information Obtained From Patient Constitutional Symptoms (General Health) Medical History: Past Medical History Notes: Insomnia Syncope Alcohol Dependence HIV Eyes Medical History: Positive for: Cataracts - mild; Glaucoma Negative for: Optic Neuritis Ear/Nose/Mouth/Throat Medical History: Negative for: Chronic sinus problems/congestion; Middle ear problems Hematologic/Lymphatic Medical History: Positive for: Anemia Negative for: Hemophilia; Human Immunodeficiency Virus; Lymphedema; Sickle Cell Disease Past  Medical History Notes: Hyperlipidemia Hyperglycemia Respiratory Medical History: Positive for: Chronic Obstructive Pulmonary Disease (COPD) Negative for: Aspiration; Asthma; Pneumothorax; Sleep Apnea; Tuberculosis Cardiovascular Medical History: Positive for: Congestive Heart Failure; Coronary Artery Disease; Hypertension; Myocardial Infarction - 2004 Negative for: Angina; Arrhythmia; Deep Vein Thrombosis; Hypotension; Peripheral Arterial Disease; Peripheral Venous Disease; Phlebitis; Vasculitis Past Medical History Notes: CABG, ischemic cardiomyopathy Gastrointestinal Medical History: Positive for: Hepatitis C - 1968 Negative for: Cirrhosis ; Colitis; Crohns; Hepatitis A; Hepatitis  B Past Medical History Notes: GERD Endocrine Medical History: Positive for: Type II Diabetes Negative for: Type I Diabetes Time with diabetes: 13 yrs Treated with: Insulin Blood sugar tested every day: Yes Tested : 2 times per day Blood sugar testing results: Breakfast: 100; Dinner: 95-126 Genitourinary Medical History: Negative for: End Stage Renal Disease Past Medical History Notes: Syphilis CKD,Nocturia Immunological Medical History: Negative for: Lupus Erythematosus; Raynauds; Scleroderma Past Medical History Notes: HIV Integumentary (Skin) Medical History: Negative for: History of Burn Musculoskeletal Medical History: Positive for: Gout; Osteoarthritis Negative for: Rheumatoid Arthritis; Osteomyelitis Past Medical History Notes: Hip Fx BiLateral Bunions Unspecified Arthritis Neurologic Medical History: Positive for: Neuropathy Negative for: Dementia; Quadriplegia; Paraplegia; Seizure Disorder Past Medical History Notes: chronic back pain Oncologic Medical History: Negative for: Received Chemotherapy; Received Radiation Psychiatric Medical History: Negative for: Anorexia/bulimia; Confinement Anxiety HBO Extended History Items Eyes: Eyes: Cataracts Glaucoma Immunizations Pneumococcal Vaccine: Received Pneumococcal Vaccination: Yes Received Pneumococcal Vaccination On or After 60th Birthday: Yes Implantable Devices No devices added Hospitalization / Surgery History Type of Hospitalization/Surgery Colonoscopy CABG Laproscopic Cholecystectomy Left Hip Fx Left Jaw Fx Lumbar Laminectomy Family and Social History Cancer: No; Diabetes: Yes - Siblings,Father; Heart Disease: Yes - Father,Siblings; Hereditary Spherocytosis: No; Hypertension: Yes - Father,Siblings; Kidney Disease: No; Lung Disease: No; Seizures: No; Stroke: No; Thyroid Problems: No; Tuberculosis: No; Current every day smoker - 1/2 ppd / 60yr; Marital Status - Single; Alcohol Use: Moderate  - 6 standard drinks per week; Drug Use: Prior History - Polysubstance Abuse; clean for 9 years; Caffeine Use: Rarely - tea; Financial Concerns: No; Food, Clothing or Shelter Needs: No; Support System Lacking: No; Transportation Concerns: No Electronic Signature(s) Signed: 05/31/2022 12:29:09 PM By: CFredirick MaudlinMD FACS Signed: 05/31/2022 6:13:27 PM By: BBaruch GoutyRN, BSN Entered By: CFredirick Maudlinon 05/31/2022 11:53:38 -------------------------------------------------------------------------------- SuperBill Details Patient Name: Date of Service: NRaechel Ache8/05/2022 Medical Record Number: [id number] Account Number: [id number] of Birth/Sex: Treating RN: 410-07-1951(71y.o. MErnestene MentionPrimary Care Provider: MAlain HoneyOther Clinician: Referring Provider: Treating Provider/Extender: CNeil Crouchin Treatment: 13 Diagnosis Coding ICD-10 Codes Code Description [id number]-pressure chronic ulcer of other part of right foot with fat layer exposed E11.22 Type 2 diabetes mellitus with diabetic chronic kidney disease J44.9 Chronic obstructive pulmonary disease, unspecified I50.42 Chronic combined systolic (congestive) and diastolic (congestive) heart failure I10 Essential (primary) hypertension N25.81 Secondary hyperparathyroidism of renal origin N18.6 End stage renal disease B20 Human immunodeficiency virus [HIV] disease Facility Procedures CPT4 Code: [id number]: 11042 - DEB SUBQ TISSUE 20 SQ CM/< ICD-10 Diagnosis Description L97.512 Non-pressure chronic ulcer of other part of right foot with fat layer exposed Modifier: Quantity: 1 Physician Procedures : CPT4 Code Description Modifier [id number] 79892- WC PHYS LEVEL 3 - EST PT 25 ICD-10 Diagnosis Description L97.512 Non-pressure chronic ulcer of other part of right foot with fat layer exposed I50.42 Chronic combined systolic (congestive) and diastolic   (congestive) heart failure N18.6 End stage renal disease B20 Human  immunodeficiency virus [HIV] disease Quantity: 1 : [id number] 63817 - WC PHYS SUBQ TISS 20 SQ CM 1 ICD-10 Diagnosis Description L97.512 Non-pressure chronic ulcer of other part of right foot with fat layer exposed Quantity: Electronic Signature(s) Signed: 05/31/2022 11:55:37 AM By: Fredirick Maudlin MD FACS Entered By: Fredirick Maudlin on 05/31/2022 11:55:37

## 2022-05-31 NOTE — Progress Notes (Signed)
Jacob [name], Jacob [name] ([id number]) Visit Report for 05/31/2022 Arrival Information Details Patient Name: Date of Service: Jacob [name], Jacob [name] 05/31/2022 11:15 A M Medical Record Number: [id number] Patient Account Number: [id number] Date of Birth/Sex: Treating RN: 06-25-1951 (71 y.o. [name] Primary Care Emitt Maglione: Alain Honey Other Clinician: Referring Desarea Ohagan: Treating Antjuan Rothe/Extender: Neil Crouch in Treatment: 46 Visit Information History Since Last Visit Added or deleted any medications: No Patient Arrived: [name] Any new allergies or adverse reactions: No Arrival Time: 11:15 Had a fall or experienced change in No Accompanied By: self activities of daily living that may affect Transfer Assistance: None risk of falls: Patient Identification Verified: Yes Signs or symptoms of abuse/neglect since last visito No Secondary Verification Process Completed: Yes Hospitalized since last visit: No Patient Requires Transmission-Based Precautions: No Implantable device outside of the clinic excluding No Patient Has Alerts: No cellular tissue based products placed in the center since last visit: Has Dressing in Place as Prescribed: Yes Has Footwear/Offloading in Place as Prescribed: Yes Left: Other:diabetic shoes Right: Other:diabetic shoes Pain Present Now: No Electronic Signature(s) Signed: 05/31/2022 6:13:27 PM By: Baruch Gouty RN, BSN Entered By: Baruch Gouty on 05/31/2022 11:17:02 -------------------------------------------------------------------------------- Encounter Discharge Information Details Patient Name: Date of Service: Jacob Ache. 05/31/2022 11:15 A M Medical Record Number: [id number] Patient Account Number: [id number] Date of Birth/Sex: Treating RN: 18-Aug-1951 (71 y.o. [name] Primary Care Corene Resnick: Alain Honey Other Clinician: Referring Ahsley Attwood: Treating Nakina Spatz/Extender: Neil Crouch in Treatment: 13 Encounter Discharge Information Items Post Procedure Vitals Discharge Condition: Stable Temperature (F): 98.4 Ambulatory Status: Cane Pulse (bpm): 76 Discharge Destination: Home Respiratory Rate (breaths/min): 18 Transportation: Private Auto Blood Pressure (mmHg): 131/62 Accompanied By: self Schedule Follow-up Appointment: Yes Clinical Summary of Care: Patient Declined Electronic Signature(s) Signed: 05/31/2022 6:13:27 PM By: Baruch Gouty RN, BSN Entered By: Baruch Gouty on 05/31/2022 12:04:07 -------------------------------------------------------------------------------- Lower Extremity Assessment Details Patient Name: Date of Service: Jacob Ache. 05/31/2022 11:15 A M Medical Record Number: [id number] Patient Account Number: [id number] Date of Birth/Sex: Treating RN: 1951/01/07 (71 y.o. [name] Primary Care Elandra Powell: Alain Honey Other Clinician: Referring Shloka Baldridge: Treating Sonia Bromell/Extender: Neil Crouch in Treatment: 13 Edema Assessment Assessed: [name]: No] Patrice Paradise: No] Edema: [Left: N] [Right: o] Calf Left: Right: Point of Measurement: From Medial Instep 32.6 cm Ankle Left: Right: Point of Measurement: From Medial Instep 20 cm Vascular Assessment Pulses: Dorsalis Pedis Palpable: [Right:Yes] Electronic Signature(s) Signed: 05/31/2022 6:13:27 PM By: Baruch Gouty RN, BSN Entered By: Baruch Gouty on 05/31/2022 11:19:32 -------------------------------------------------------------------------------- Multi Wound Chart Details Patient Name: Date of Service: Jacob Ache. 05/31/2022 11:15 A M Medical Record Number: [id number] Patient Account Number: [id number] Date of Birth/Sex: Treating RN: 1951/08/07 (71 y.o. [name] Primary Care Porter Moes: Alain Honey Other Clinician: Referring Olivine Hiers: Treating Timmie Dugue/Extender: Neil Crouch in  Treatment: 13 Vital Signs Height(in): 74 Capillary Blood Glucose(mg/dl): 300 Weight(lbs): 172 Pulse(bpm): 71 Body Mass Index(BMI): 22.1 Blood Pressure(mmHg): 131/62 Temperature(F): 98.4 Respiratory Rate(breaths/min): 18 Photos: [8:Right, Plantar Foot] [N/A:N/A N/A] Wound Location: [8:Blister] [N/A:N/A] Wounding Event: [8:Diabetic Wound/Ulcer of the Lower] [N/A:N/A] Primary Etiology: [8:Extremity Cataracts, Glaucoma, Anemia, Chronic N/A] Comorbid History: [8:Obstructive Pulmonary Disease (COPD), Congestive Heart Failure, Coronary Artery Disease, Hypertension, Myocardial Infarction, Hepatitis C, Type II Diabetes, Gout, Osteoarthritis, Neuropathy 03/08/2022] [N/A:N/A] Date Acquired: [8:12] [N/A:N/A] Weeks of Treatment: [8:Open] [N/A:N/A] Wound Status: [8:No] [N/A:N/A] Wound Recurrence: [8:0.6x0.6x0.6] [N/A:N/A] Measurements L x  W x D (cm) [8:0.283] [N/A:N/A] A (cm) : rea [8:0.17] [N/A:N/A] Volume (cm) : [8:74.30%] [N/A:N/A] % Reduction in A [8:rea: 48.50%] [N/A:N/A] % Reduction in Volume: [8:5] Starting Position 1 (o'clock): [8:7] Ending Position 1 (o'clock): [8:0.4] Maximum Distance 1 (cm): [8:Yes] [N/A:N/A] Undermining: [8:Grade 2] [N/A:N/A] Classification: [8:Small] [N/A:N/A] Exudate A mount: [8:Serosanguineous] [N/A:N/A] Exudate Type: [8:red, brown] [N/A:N/A] Exudate Color: [8:Thickened] [N/A:N/A] Wound Margin: [8:Large (67-100%)] [N/A:N/A] Granulation A mount: [8:Red] [N/A:N/A] Granulation Quality: [8:Small (1-33%)] [N/A:N/A] Necrotic A mount: [8:Fat Layer (Subcutaneous Tissue): Yes N/A] Exposed Structures: [8:Fascia: No Tendon: No Muscle: No Joint: No Bone: No Small (1-33%)] [N/A:N/A] Epithelialization: [8:Debridement - Excisional] [N/A:N/A] Debridement: Pre-procedure Verification/Time Out 11:25 [N/A:N/A] Taken: [8:Lidocaine 4% Topical Solution] [N/A:N/A] Pain Control: [8:Necrotic/Eschar, Subcutaneous,] [N/A:N/A] Tissue Debrided: [8:Slough Skin/Subcutaneous Tissue]  [N/A:N/A] Level: [8:0.64] [N/A:N/A] Debridement A (sq cm): [8:rea Curette] [N/A:N/A] Instrument: [8:Minimum] [N/A:N/A] Bleeding: [8:Pressure] [N/A:N/A] Hemostasis Achieved: [8:0] [N/A:N/A] Procedural Pain: [8:0] [N/A:N/A] Post Procedural Pain: Debridement Treatment Response: Procedure was tolerated well [N/A:N/A] Post Debridement Measurements L x 0.8x0.8x0.6 [N/A:N/A] W x D (cm) [8:0.302] [N/A:N/A] Post Debridement Volume: (cm) [8:Debridement] [N/A:N/A] Treatment Notes Electronic Signature(s) Signed: 05/31/2022 11:52:21 AM By: Fredirick Maudlin MD FACS Signed: 05/31/2022 6:13:27 PM By: Baruch Gouty RN, BSN Entered By: Fredirick Maudlin on 05/31/2022 11:52:21 -------------------------------------------------------------------------------- Multi-Disciplinary Care Plan Details Patient Name: Date of Service: Jacob Ache. 05/31/2022 11:15 A M Medical Record Number: [id number] Patient Account Number: [id number] Date of Birth/Sex: Treating RN: 05-04-1951 (71 y.o. [name] Primary Care Maniah Nading: Alain Honey Other Clinician: Referring Fadumo Heng: Treating Ugo Thoma/Extender: Neil Crouch in Treatment: [place] reviewed with physician Active Inactive Nutrition Nursing Diagnoses: Impaired glucose control: actual or potential Potential for alteratiion in Nutrition/Potential for imbalanced nutrition Goals: Patient/caregiver will maintain therapeutic glucose control Date Initiated: 05/10/2022 Target Resolution Date: 06/14/2022 Goal Status: Active Interventions: Assess HgA1c results as ordered upon admission and as needed Provide education on elevated blood sugars and impact on wound healing Treatment Activities: Dietary management education, guidance and counseling : 05/10/2022 Patient referred to Primary Care Physician for further nutritional evaluation : 05/10/2022 Notes: Wound/Skin Impairment Nursing Diagnoses: Impaired  tissue integrity Goals: Patient/caregiver will verbalize understanding of skin care regimen Date Initiated: 03/01/2022 Target Resolution Date: 06/14/2022 Goal Status: Active Interventions: Assess ulceration(s) every visit Treatment Activities: Skin care regimen initiated : 03/01/2022 Notes: Electronic Signature(s) Signed: 05/31/2022 6:13:27 PM By: Baruch Gouty RN, BSN Entered By: Baruch Gouty on 05/31/2022 11:27:13 -------------------------------------------------------------------------------- Pain Assessment Details Patient Name: Date of Service: Jacob Ache. 05/31/2022 11:15 A M Medical Record Number: [id number] Patient Account Number: [id number] Date of Birth/Sex: Treating RN: 1950/11/01 (71 y.o. [name] Primary Care Rika Daughdrill: Alain Honey Other Clinician: Referring Evea Sheek: Treating Malick Netz/Extender: Neil Crouch in Treatment: 13 Active Problems Location of Pain Severity and Description of Pain Patient Has Paino No Site Locations Rate the pain. Rate the pain. Current Pain Level: 0 Pain Management and Medication Current Pain Management: Electronic Signature(s) Signed: 05/31/2022 6:13:27 PM By: Baruch Gouty RN, BSN Entered By: Baruch Gouty on 05/31/2022 11:19:02 -------------------------------------------------------------------------------- Patient/Caregiver Education Details Patient Name: Date of Service: Jacob Ache 8/8/2023andnbsp11:15 A M Medical Record Number: [id number] Patient Account Number: [id number] Date of Birth/Gender: Treating RN: Nov 05, 1950 (71 y.o. [name] Primary Care Physician: Alain Honey Other Clinician: Referring Physician: Treating Physician/Extender: Neil Crouch in Treatment: 13 Education Assessment Education Provided To: Patient Education Topics Provided Elevated Blood Sugar/ Impact on Healing:  Methods: Explain/Verbal Responses:  Reinforcements needed, State content correctly Offloading: Methods: Explain/Verbal Responses: Reinforcements needed, State content correctly Wound/Skin Impairment: Methods: Explain/Verbal Responses: Reinforcements needed, State content correctly Electronic Signature(s) Signed: 05/31/2022 6:13:27 PM By: Baruch Gouty RN, BSN Entered By: Baruch Gouty on 05/31/2022 11:27:43 -------------------------------------------------------------------------------- Wound Assessment Details Patient Name: Date of Service: Jacob Ache. 05/31/2022 11:15 A M Medical Record Number: [id number] Patient Account Number: [id number] Date of Birth/Sex: Treating RN: 1951/01/10 (71 y.o. [name] Primary Care Dovey Fatzinger: Alain Honey Other Clinician: Referring Skylyn Slezak: Treating Autrey Human/Extender: Neil Crouch in Treatment: 13 Wound Status Wound Number: 8 Primary Diabetic Wound/Ulcer of the Lower Extremity Etiology: Wound Location: Right, Plantar Foot Wound Open Wounding Event: Blister Status: Date Acquired: 03/08/2022 Comorbid Cataracts, Glaucoma, Anemia, Chronic Obstructive Pulmonary Weeks Of Treatment: 12 History: Disease (COPD), Congestive Heart Failure, Coronary Artery Clustered Wound: No Disease, Hypertension, Myocardial Infarction, Hepatitis C, Type II Diabetes, Gout, Osteoarthritis, Neuropathy Photos Wound Measurements Length: (cm) 0.6 Width: (cm) 0.6 Depth: (cm) 0.6 Area: (cm) 0.283 Volume: (cm) 0.17 % Reduction in Area: 74.3% % Reduction in Volume: 48.5% Epithelialization: Small (1-33%) Tunneling: No Undermining: Yes Starting Position (o'clock): 5 Ending Position (o'clock): 7 Maximum Distance: (cm) 0.4 Wound Description Classification: Grade 2 Wound Margin: Thickened Exudate Amount: Small Exudate Type: Serosanguineous Exudate Color: red, brown Foul Odor After Cleansing: No Slough/Fibrino Yes Wound Bed Granulation Amount: Large  (67-100%) Exposed Structure Granulation Quality: Red Fascia Exposed: No Necrotic Amount: Small (1-33%) Fat Layer (Subcutaneous Tissue) Exposed: Yes Necrotic Quality: Adherent Slough Tendon Exposed: No Muscle Exposed: No Joint Exposed: No Bone Exposed: No Electronic Signature(s) Signed: 05/31/2022 4:36:53 PM By: Blanche East RN Signed: 05/31/2022 6:13:27 PM By: Baruch Gouty RN, BSN Entered By: Blanche East on 05/31/2022 11:24:33 -------------------------------------------------------------------------------- [place] Details Patient Name: Date of Service: Jacob Katos R. 05/31/2022 11:15 A M Medical Record Number: [id number] Patient Account Number: [id number] Date of Birth/Sex: Treating RN: February 12, 1951 (71 y.o. [name] Primary Care Caron Tardif: Alain Honey Other Clinician: Referring Aleese Kamps: Treating Auron Tadros/Extender: Neil Crouch in Treatment: 13 Vital Signs Time Taken: 11:17 Temperature (F): 98.4 Height (in): 74 Pulse (bpm): 76 Weight (lbs): 172 Respiratory Rate (breaths/min): 18 Body Mass Index (BMI): 22.1 Blood Pressure (mmHg): 131/62 Capillary Blood Glucose (mg/dl): 300 Reference Range: 80 - 120 mg / dl Notes glucose per pt report this am Electronic Signature(s) Signed: 05/31/2022 6:13:27 PM By: Baruch Gouty RN, BSN Entered By: Baruch Gouty on 05/31/2022 11:18:50

## 2022-06-01 DIAGNOSIS — D631 Anemia in chronic kidney disease: Secondary | ICD-10-CM | POA: Diagnosis not present

## 2022-06-01 DIAGNOSIS — Z992 Dependence on renal dialysis: Secondary | ICD-10-CM | POA: Diagnosis not present

## 2022-06-01 DIAGNOSIS — N2581 Secondary hyperparathyroidism of renal origin: Secondary | ICD-10-CM | POA: Diagnosis not present

## 2022-06-01 DIAGNOSIS — N186 End stage renal disease: Secondary | ICD-10-CM | POA: Diagnosis not present

## 2022-06-02 ENCOUNTER — Telehealth: Payer: Self-pay

## 2022-06-02 NOTE — Telephone Encounter (Signed)
   Telephone encounter was:  Unsuccessful.  06/02/2022 Name: Jacob [name] MRN: [id number] DOB: 01/15/1951  Unsuccessful outbound call made today to assist with:  Transportation Needs   Outreach Attempt:  3rd Attempt.  Referral closed unable to contact patient.  A HIPAA compliant voice message was left requesting a return call.  Instructed patient to call back at [phone]. Left message on voicemail/mobile to remind patient to respond to phone notifications from [facility] for his upcoming transportation to appointments scheduled 8/22 and 8/31. No answer at home phone unable to leave message.  Received the following message from [employer] at  [facility] evening, We do apologize for this. This ride was canceled [Passenger Did Not Confirm]. If the member doesn't confirm the ride through the automated service it will automatically cancel the ride."   Werner Labella, AAS Paralegal, [place]  [address], [place] [address] ??[name]  ?? [phone]   [email or web address]

## 2022-06-03 DIAGNOSIS — N186 End stage renal disease: Secondary | ICD-10-CM | POA: Diagnosis not present

## 2022-06-03 DIAGNOSIS — N2581 Secondary hyperparathyroidism of renal origin: Secondary | ICD-10-CM | POA: Diagnosis not present

## 2022-06-03 DIAGNOSIS — D631 Anemia in chronic kidney disease: Secondary | ICD-10-CM | POA: Diagnosis not present

## 2022-06-03 DIAGNOSIS — Z992 Dependence on renal dialysis: Secondary | ICD-10-CM | POA: Diagnosis not present

## 2022-06-05 ENCOUNTER — Other Ambulatory Visit: Payer: Self-pay | Admitting: Nurse Practitioner

## 2022-06-05 DIAGNOSIS — E119 Type 2 diabetes mellitus without complications: Secondary | ICD-10-CM

## 2022-06-06 DIAGNOSIS — D631 Anemia in chronic kidney disease: Secondary | ICD-10-CM | POA: Diagnosis not present

## 2022-06-06 DIAGNOSIS — Z992 Dependence on renal dialysis: Secondary | ICD-10-CM | POA: Diagnosis not present

## 2022-06-06 DIAGNOSIS — N2581 Secondary hyperparathyroidism of renal origin: Secondary | ICD-10-CM | POA: Diagnosis not present

## 2022-06-06 DIAGNOSIS — N186 End stage renal disease: Secondary | ICD-10-CM | POA: Diagnosis not present

## 2022-06-07 ENCOUNTER — Telehealth: Payer: Self-pay | Admitting: *Deleted

## 2022-06-07 NOTE — Telephone Encounter (Signed)
Patient called and stated that his Pharmacist told him to ask about him taking Tylenol '650mg'$  Three times daily and Taking the Oxycodone.   Patient confirmed he is taking both and wants to know if it is ok. Pharmacist told him to call his Dr. Before taking anymore.   Please Advise.

## 2022-06-07 NOTE — Telephone Encounter (Signed)
Jacob Honour, MD  You 12 minutes ago (4:35 PM)   That is still below the max '3000mg'$  dosage, so no more      Patient notified and agreed.

## 2022-06-08 DIAGNOSIS — D631 Anemia in chronic kidney disease: Secondary | ICD-10-CM | POA: Diagnosis not present

## 2022-06-08 DIAGNOSIS — Z992 Dependence on renal dialysis: Secondary | ICD-10-CM | POA: Diagnosis not present

## 2022-06-08 DIAGNOSIS — N2581 Secondary hyperparathyroidism of renal origin: Secondary | ICD-10-CM | POA: Diagnosis not present

## 2022-06-08 DIAGNOSIS — N186 End stage renal disease: Secondary | ICD-10-CM | POA: Diagnosis not present

## 2022-06-09 ENCOUNTER — Other Ambulatory Visit: Payer: Self-pay | Admitting: Nurse Practitioner

## 2022-06-09 ENCOUNTER — Other Ambulatory Visit: Payer: Self-pay | Admitting: Family Medicine

## 2022-06-09 ENCOUNTER — Other Ambulatory Visit: Payer: Self-pay

## 2022-06-09 NOTE — Telephone Encounter (Signed)
pt called and asked if [name] had handled his medicine i told the pt that he has to wait until Dr. Sabra Heck reviewed it the pt then said that [place] and dinah are sorry excuses for a nurse

## 2022-06-09 NOTE — Telephone Encounter (Signed)
Patient request refill of Oxycodone.Last refill on medication was 05/31/22.  Opioid contract on file and up to date.  Medication pended and sent to Sherrie Mustache, NP (covering provider for Dr. Alain Honey)

## 2022-06-10 ENCOUNTER — Encounter: Payer: Self-pay | Admitting: *Deleted

## 2022-06-10 DIAGNOSIS — Z992 Dependence on renal dialysis: Secondary | ICD-10-CM | POA: Diagnosis not present

## 2022-06-10 DIAGNOSIS — D631 Anemia in chronic kidney disease: Secondary | ICD-10-CM | POA: Diagnosis not present

## 2022-06-10 DIAGNOSIS — N186 End stage renal disease: Secondary | ICD-10-CM | POA: Diagnosis not present

## 2022-06-10 DIAGNOSIS — N2581 Secondary hyperparathyroidism of renal origin: Secondary | ICD-10-CM | POA: Diagnosis not present

## 2022-06-10 NOTE — Telephone Encounter (Addendum)
Patient called requesting a refill on his pain medication. States that he only has 2 pills left.  Stated that he called yesterday and spoke with Evie and was told that [name] would not refill his medication.   Patient stated that he needs the refill.  Epic LR: 05/31/2022 Contract Date: 07/20/2021  Pended Rx and sent to Dr. Sabra Heck for approval.

## 2022-06-10 NOTE — Telephone Encounter (Signed)
error 

## 2022-06-10 NOTE — Addendum Note (Signed)
Addended by: Rafael Bihari A on: 06/10/2022 09:11 AM   Modules accepted: Orders

## 2022-06-13 ENCOUNTER — Telehealth: Payer: Self-pay

## 2022-06-13 DIAGNOSIS — Z992 Dependence on renal dialysis: Secondary | ICD-10-CM | POA: Diagnosis not present

## 2022-06-13 DIAGNOSIS — N2581 Secondary hyperparathyroidism of renal origin: Secondary | ICD-10-CM | POA: Diagnosis not present

## 2022-06-13 DIAGNOSIS — N186 End stage renal disease: Secondary | ICD-10-CM | POA: Diagnosis not present

## 2022-06-13 DIAGNOSIS — D631 Anemia in chronic kidney disease: Secondary | ICD-10-CM | POA: Diagnosis not present

## 2022-06-13 MED ORDER — OXYCODONE-ACETAMINOPHEN 5-325 MG PO TABS: 1.0000 | ORAL_TABLET | Freq: Four times a day (QID) | ORAL | 0 refills | Status: DC | PRN

## 2022-06-13 NOTE — Telephone Encounter (Signed)
   Telephone encounter was:  Successful.  06/13/2022 Name: Jacob [name] MRN: [id number] DOB: 1951/09/03  Jacob [name] is a 71 y.o. year old male who is a primary care patient of [name], MD . The community resource team was consulted for assistance with Transportation Needs   Care guide performed the following interventions: Retured patient's call regarding transportation for 8/22. Patient stated that he has arranged transportation for 8/22 and requested that I cancel the ride.  I inquired if he still wanted to keep the ride scheduled for 8/31 and he said yes.    Follow Up Plan:  No further follow up planned at this time. The patient has been provided with needed resources.  Zong Mcquarrie, AAS Paralegal, [place] [facility] [facility]  [address], [place] [address] ??[email or web address]  ?? [phone]   [email or web address]

## 2022-06-14 ENCOUNTER — Ambulatory Visit: Payer: Medicare Other | Admitting: Family Medicine

## 2022-06-14 ENCOUNTER — Encounter: Payer: Self-pay | Admitting: Family Medicine

## 2022-06-14 ENCOUNTER — Ambulatory Visit (INDEPENDENT_AMBULATORY_CARE_PROVIDER_SITE_OTHER): Payer: Medicare Other | Admitting: Family Medicine

## 2022-06-14 VITALS — BP 90/50 | HR 79 | Temp 96.6°F | Ht 69.0 in | Wt 185.9 lb

## 2022-06-14 DIAGNOSIS — L97419 Non-pressure chronic ulcer of right heel and midfoot with unspecified severity: Secondary | ICD-10-CM | POA: Diagnosis not present

## 2022-06-14 DIAGNOSIS — I509 Heart failure, unspecified: Secondary | ICD-10-CM

## 2022-06-14 DIAGNOSIS — F1190 Opioid use, unspecified, uncomplicated: Secondary | ICD-10-CM

## 2022-06-14 DIAGNOSIS — M545 Low back pain, unspecified: Secondary | ICD-10-CM | POA: Diagnosis not present

## 2022-06-14 DIAGNOSIS — E11621 Type 2 diabetes mellitus with foot ulcer: Secondary | ICD-10-CM

## 2022-06-14 DIAGNOSIS — E782 Mixed hyperlipidemia: Secondary | ICD-10-CM | POA: Diagnosis not present

## 2022-06-14 DIAGNOSIS — B20 Human immunodeficiency virus [HIV] disease: Secondary | ICD-10-CM

## 2022-06-14 DIAGNOSIS — G8929 Other chronic pain: Secondary | ICD-10-CM | POA: Diagnosis not present

## 2022-06-14 NOTE — Progress Notes (Signed)
Provider:  Alain Honey, MD  Careteam: Patient Care Team: [name], MD as PCP - General (Family Medicine) [name], MD as PCP - Infectious Diseases (Infectious Diseases) [name], MD as PCP - Cardiology (Cardiology) [name], MD as Consulting Physician (Ophthalmology) [name], MD as Consulting Physician (Cardiology) [name], MD as Consulting Physician (Infectious Diseases) Gardiner Barefoot, DPM as Consulting Physician (Podiatry) [name], Nelda Bucks, NP as Nurse Practitioner (Family Medicine) Kidney, [facility] [place] [facility]  PLACE OF SERVICE:  [place]  Advanced Directive information    Allergies  Allergen Reactions   Augmentin [Amoxicillin-Pot Clavulanate] Diarrhea and Other (See Comments)    Severe diarrhea   Morphine Sulfate Anaphylaxis    Can't mix with oxycodone    Mucinex Fast-Max Other (See Comments)    Intense sweating    Amphetamines Other (See Comments)    Unknown reaction    Chief Complaint  Patient presents with   Medical Management of Chronic Issues    Patient presents today for a 3 month follow-up   Quality Metric Gaps    COVID #5     HPI: Patient is a 71 y.o. male patient is here to follow-up chronic problems including arthritis chronic low back pain, end-stage renal disease on dialysis HIV, hypertension, gout, hyperlipidemia and peripheral neuropathy.  His quality of life was poor with pain and I elected to start him on opiates given his multiple and varied symptoms.  I am aware that he has a history of drug abuse including cocaine in the past.  Things are going okay at dialysis.  He has had multiple stent revisions.  So far, I do not think he is taking the oxycodone to differently than it has been prescribed.  There are times when he has surgery that he has taken a few more but in general I think he is trying to comply with this medication as well as drug screens every 3 months.  Review of Systems:   Review of Systems  Constitutional: Negative.   HENT: Negative.    Respiratory: Negative.    Cardiovascular: Negative.   Gastrointestinal: Negative.   Musculoskeletal:  Positive for joint pain.  Skin: Negative.   All other systems reviewed and are negative.   Past Medical History:  Diagnosis Date   Acute respiratory failure ([place]) 03/01/2018   Anemia    Arthritis    "all over; mostly knees and back" (02/28/2018)   Chronic combined systolic and diastolic CHF (congestive heart failure) (HCC)    Chronic lower back pain    stenosis   Community acquired pneumonia 09/06/2013   COPD (chronic obstructive pulmonary disease) ([place])    Coronary atherosclerosis of native coronary artery    a. 02/2003 s/p CABG x 2 (VG->RI, VG->RPDA; b. 11/2019 PCI: LM nl, LAD 90d, D3 50, RI 100, LCX 100p, OM3 100 - fills via L->L collats from D2/dLAD, RCA 100p, VG->RPDA ok, VG->RI 95 (3.5x48 Synergy XD DES).   Drug abuse ([place])    hx; tested for cocaine as recently as 2/08. says he is not using drugs now - avoided defib. for this reason    ESRD (end stage renal disease) ([place])    H[place] M-W-F- [facility]   Fall at home 10/2020   GERD (gastroesophageal reflux disease)    takes OTC meds as needed   GI bleeding    a. 11/2019 EGD: angiodysplastic lesions w/ bleeding s/p argon plasma/clipping/epi inj. Multiple admissions for the same.   Glaucoma  uses eye drops daily   Hepatitis B 1968   "tx'd w/isolation; caught it from toilet stools in gym"   History of blood transfusion 03/01/2019   History of colon polyps    benign   History of gout    takes Allopurinol daily as well as Colchicine-if needed (02/28/2018)   History of kidney stones    HTN (hypertension)    takes Coreg,Imdur.and Apresoline daily   Human immunodeficiency virus (HIV) disease ([place]) dx'd 1995   on [place] as of 12/2020.     Hyperlipidemia    Ischemic cardiomyopathy    a. 01/2019 Echo: EF 40-45%, diffuse HK, mild basal septal hypertrophy. Diast dysfxn.  Nl RV size/fxn. Sev dil LA. Triv MR/TR/PR.   Muscle spasm    takes Zanaflex as needed   Myocardial infarction ([place]) ~ 2004/2005   Nocturia    Peripheral neuropathy    takes gabapentin daily   Pneumonia    "at least twice" (02/28/2018)   SDH (subdural hematoma) (HCC)    Syphilis, unspecified    Type II diabetes mellitus ([place]) 2004   Lantus daily.Average fasting blood sugar 125-199   Wears glasses    Wears partial dentures    Past Surgical History:  Procedure Laterality Date   AV FISTULA PLACEMENT Left 08/02/2018   Procedure: ARTERIOVENOUS (AV) FISTULA CREATION  left arm radiocephlic;  Surgeon: Marty Heck, MD;  Location: [place];  Service: Vascular;  Laterality: Left;   AV FISTULA PLACEMENT Left 08/01/2019   Procedure: LEFT BRACHIOCEPHALIC ARTERIOVENOUS (AV) FISTULA CREATION;  Surgeon: [name], MD;  Location: [place];  Service: Vascular;  Laterality: Left;   AV FISTULA PLACEMENT Right 04/12/2022   Procedure: RIGHT UPPER EXTREMITY ARTERIOVENOUS (AV)  GRAFT INSERTION USING GORE STRETCH 4-7 MM;  Surgeon: Angelia Mould, MD;  Location: [place];  Service: Vascular;  Laterality: Right;   [place] Left 10/03/2019   Procedure: BASILIC VEIN TRANSPOSITION LEFT SECOND STAGE;  Surgeon: [name], MD;  Location: [place];  Service: Vascular;  Laterality: Left;   BIOPSY  01/25/2021   Procedure: BIOPSY;  Surgeon: Doran Stabler, MD;  Location: [facility] ENDOSCOPY;  Service: Gastroenterology;;   CARDIAC CATHETERIZATION  10/2002; 12/19/2004   [name] 03/08/2011   COLONOSCOPY  2013   [place]    CORONARY ARTERY BYPASS GRAFT  02/24/2003   CABG X2/notes 03/08/2011   CORONARY STENT INTERVENTION N/A 12/19/2019   Procedure: CORONARY STENT INTERVENTION;  Surgeon: Jettie Booze, MD;  Location: [place] CV LAB;  Service: Cardiovascular;  Laterality: N/A;   ENTEROSCOPY N/A 01/25/2021   Procedure: ENTEROSCOPY;  Surgeon: Doran Stabler, MD;  Location: [place];   Service: Gastroenterology;  Laterality: N/A;   ENTEROSCOPY N/A 02/13/2021   Procedure: ENTEROSCOPY;  Surgeon: Jackquline Denmark, MD;  Location: [facility] ENDOSCOPY;  Service: Endoscopy;  Laterality: N/A;   ENTEROSCOPY N/A 05/07/2021   Procedure: ENTEROSCOPY;  Surgeon: Yetta Flock, MD;  Location: [facility] ENDOSCOPY;  Service: Gastroenterology;  Laterality: N/A;   ESOPHAGOGASTRODUODENOSCOPY (EGD) WITH PROPOFOL N/A 02/08/2019   Procedure: ESOPHAGOGASTRODUODENOSCOPY (EGD) WITH PROPOFOL;  Surgeon: Milus Banister, MD;  Location: [place];  Service: Gastroenterology;  Laterality: N/A;   ESOPHAGOGASTRODUODENOSCOPY (EGD) WITH PROPOFOL N/A 12/22/2019   Procedure: ESOPHAGOGASTRODUODENOSCOPY (EGD) WITH PROPOFOL;  Surgeon: Lavena Bullion, DO;  Location: [place];  Service: Gastroenterology;  Laterality: N/A;   ESOPHAGOGASTRODUODENOSCOPY (EGD) WITH PROPOFOL N/A 10/19/2020   Procedure: ESOPHAGOGASTRODUODENOSCOPY (EGD) WITH PROPOFOL;  Surgeon: Jackquline Denmark, MD;  Location: [facility]  ENDOSCOPY;  Service: Endoscopy;  Laterality: N/A;   ESOPHAGOGASTRODUODENOSCOPY (EGD) WITH PROPOFOL N/A 12/22/2020   Procedure: ESOPHAGOGASTRODUODENOSCOPY (EGD) WITH PROPOFOL;  Surgeon: Gatha Mayer, MD;  Location: [place];  Service: Endoscopy;  Laterality: N/A;   ESOPHAGOGASTRODUODENOSCOPY (EGD) WITH PROPOFOL N/A 01/09/2021   Procedure: ESOPHAGOGASTRODUODENOSCOPY (EGD) WITH PROPOFOL;  Surgeon: Irene Shipper, MD;  Location: [facility] ENDOSCOPY;  Service: Endoscopy;  Laterality: N/A;   HEMOSTASIS CLIP PLACEMENT  12/22/2019   Procedure: HEMOSTASIS CLIP PLACEMENT;  Surgeon: Lavena Bullion, DO;  Location: [place] ENDOSCOPY;  Service: Gastroenterology;;   HEMOSTASIS CLIP PLACEMENT  12/22/2020   Procedure: HEMOSTASIS CLIP PLACEMENT;  Surgeon: Gatha Mayer, MD;  Location: [facility] ENDOSCOPY;  Service: Endoscopy;;   HEMOSTASIS CONTROL  12/22/2020   Procedure: HEMOSTASIS CONTROL/hemospray;  Surgeon: Gatha Mayer, MD;  Location: [place];  Service:  Endoscopy;;   HOT HEMOSTASIS N/A 02/08/2019   Procedure: HOT HEMOSTASIS (ARGON PLASMA COAGULATION/BICAP);  Surgeon: Milus Banister, MD;  Location: [facility] ENDOSCOPY;  Service: Gastroenterology;  Laterality: N/A;   HOT HEMOSTASIS N/A 12/22/2019   Procedure: HOT HEMOSTASIS (ARGON PLASMA COAGULATION/BICAP);  Surgeon: Lavena Bullion, DO;  Location: [facility] ENDOSCOPY;  Service: Gastroenterology;  Laterality: N/A;   HOT HEMOSTASIS N/A 10/19/2020   Procedure: HOT HEMOSTASIS (ARGON PLASMA COAGULATION/BICAP);  Surgeon: Jackquline Denmark, MD;  Location: [facility] ENDOSCOPY;  Service: Endoscopy;  Laterality: N/A;   HOT HEMOSTASIS N/A 12/22/2020   Procedure: HOT HEMOSTASIS (ARGON PLASMA COAGULATION/BICAP);  Surgeon: Gatha Mayer, MD;  Location: [facility] ENDOSCOPY;  Service: Endoscopy;  Laterality: N/A;   HOT HEMOSTASIS N/A 01/09/2021   Procedure: HOT HEMOSTASIS (ARGON PLASMA COAGULATION/BICAP);  Surgeon: Irene Shipper, MD;  Location: [facility] ENDOSCOPY;  Service: Endoscopy;  Laterality: N/A;   HOT HEMOSTASIS N/A 01/25/2021   Procedure: HOT HEMOSTASIS (ARGON PLASMA COAGULATION/BICAP);  Surgeon: Doran Stabler, MD;  Location: [place];  Service: Gastroenterology;  Laterality: N/A;   HOT HEMOSTASIS N/A 02/13/2021   Procedure: HOT HEMOSTASIS (ARGON PLASMA COAGULATION/BICAP);  Surgeon: Jackquline Denmark, MD;  Location: [facility] ENDOSCOPY;  Service: Endoscopy;  Laterality: N/A;   HOT HEMOSTASIS N/A 05/07/2021   Procedure: HOT HEMOSTASIS (ARGON PLASMA COAGULATION/BICAP);  Surgeon: Yetta Flock, MD;  Location: [facility] ENDOSCOPY;  Service: Gastroenterology;  Laterality: N/A;   INSERTION OF DIALYSIS CATHETER N/A 02/08/2022   Procedure: INSERTION OF TUNNELED DIALYSIS CATHETER;  Surgeon: Angelia Mould, MD;  Location: [place];  Service: Vascular;  Laterality: N/A;   INTERTROCHANTERIC HIP FRACTURE SURGERY Left 11/2006   [name] 03/08/2011   INTRAVASCULAR ULTRASOUND/IVUS N/A 12/19/2019   Procedure: Intravascular Ultrasound/IVUS;  Surgeon: Jettie Booze, MD;  Location: [place] CV LAB;  Service: Cardiovascular;  Laterality: N/A;   IR FLUORO GUIDE CV LINE LEFT  03/14/2022   IR FLUORO GUIDE CV LINE RIGHT  07/24/2019   IR FLUORO GUIDE CV LINE RIGHT  07/30/2019   IR US GUIDE VASC ACCESS RIGHT  07/24/2019   IR US GUIDE VASC ACCESS RIGHT  07/30/2019   LAPAROSCOPIC CHOLECYSTECTOMY  05/2006   LIGATION OF ARTERIOVENOUS  FISTULA Left 02/08/2022   Procedure: LIGATION OF LEFT ARTERIOVENOUS  FISTULA;  Surgeon: Angelia Mould, MD;  Location: [place];  Service: Vascular;  Laterality: Left;   LIGATION OF COMPETING BRANCHES OF ARTERIOVENOUS FISTULA Left 11/05/2018   Procedure: LIGATION OF COMPETING BRANCHES OF ARTERIOVENOUS FISTULA  LEFT  ARM;  Surgeon: Marty Heck, MD;  Location: [place];  Service: Vascular;  Laterality: Left;   LUMBAR LAMINECTOMY/DECOMPRESSION MICRODISCECTOMY N/A 02/29/2016   Procedure:  Left L4-5 Lateral Recess Decompression, Removal Extradural Intraspinal Facet Cyst;  Surgeon: Marybelle Killings, MD;  Location: [place];  Service: Orthopedics;  Laterality: N/A;   MULTIPLE TOOTH EXTRACTIONS     ORIF MANDIBULAR FRACTURE Left 08/13/2004   ORIF of left body fracture mandible with KLS Martin 2.3-mm six hole/notes 03/08/2011   RIGHT/LEFT HEART CATH AND CORONARY/GRAFT ANGIOGRAPHY N/A 12/19/2019   Procedure: RIGHT/LEFT HEART CATH AND CORONARY/GRAFT ANGIOGRAPHY;  Surgeon: Jettie Booze, MD;  Location: [place] CV LAB;  Service: Cardiovascular;  Laterality: N/A;   SCLEROTHERAPY  12/22/2019   Procedure: SCLEROTHERAPY;  Surgeon: Lavena Bullion, DO;  Location: [place];  Service: Gastroenterology;;   Clide Deutscher  02/13/2021   Procedure: Clide Deutscher;  Surgeon: Jackquline Denmark, MD;  Location: [facility] ENDOSCOPY;  Service: Endoscopy;;   Social History:   reports that he has been smoking cigarettes. He has a 21.50 pack-year smoking history. He has never been exposed to tobacco smoke. He has never used smokeless tobacco. He reports  current alcohol use of about 12.0 standard drinks of alcohol per week. He reports that he does not currently use drugs after having used the following drugs: Cocaine.  Family History  Problem Relation Age of Onset   Heart failure Father    Hypertension Father    Diabetes Brother    Heart attack Brother    Alzheimer's disease Mother    Stroke Sister    Diabetes Sister    Alzheimer's disease Sister    Hypertension Brother    Diabetes Brother    Drug abuse Brother    Colon cancer Neg Hx     Medications: Patient's Medications  New Prescriptions   No medications on file  Previous Medications   ACETAMINOPHEN (TYLENOL) 650 MG CR TABLET    Take 1,300 mg by mouth 3 (three) times daily.   ALLOPURINOL (ZYLOPRIM) 100 MG TABLET    Take one tablet by mouth once daily.   ATORVASTATIN (LIPITOR) 10 MG TABLET    TAKE 1 TABLET(10 MG) BY MOUTH DAILY   B-D UF III MINI PEN NEEDLES 31G X 5 MM MISC    USE 4 TIMES DAILY   BIKTARVY 50-200-25 MG TABS TABLET    Take 1 tablet by mouth daily.   CALCIUM ACETATE 667 MG TABS    Take 667-2,001 mg by mouth See admin instructions. Take 2001 mg by mouth three times a day with meals and 667 mg with each snack   CARVEDILOL (COREG) 3.125 MG TABLET    Take 1 tablet (3.125 mg total) by mouth 2 (two) times daily with a meal. CALL AND SCHEDULE 1 YEAR FOLLOW UP OFFICE VISIT TO RECEIVE FURTHER REFILLS. [phone].   COLCHICINE 0.6 MG TABLET    Take one tablet by mouth once daily as needed for gout.   DICLOFENAC SODIUM (VOLTAREN) 1 % GEL    APPLY 2 GRAMS TOPICALLY TO THE AFFECTED AREA THREE TIMES DAILY AS NEEDED FOR PAIN   DOCUSATE SODIUM (COLACE) 100 MG CAPSULE    Take 100 mg by mouth daily as needed for mild constipation.   FOLIC ACID (FOLVITE) 1 MG TABLET    TAKE 1 TABLET(1 MG) BY MOUTH DAILY   GABAPENTIN (NEURONTIN) 300 MG CAPSULE    TAKE 1 CAPSULE BY MOUTH DAILY.   GLUCOSE BLOOD (ONETOUCH ULTRA) TEST STRIP    Check blood sugar three times daily E11.40   INSULIN LISPRO  (HUMALOG) 100 UNIT/ML KWIKPEN    INJECT 20 UNITS UNDER THE SKIN THREE TIMES DAILY AS  NEEDED FOR HIGH BLOOD SUGAR(ABOVE 150)   ISOSORBIDE MONONITRATE (IMDUR) 30 MG 24 HR TABLET    Take 1 tablet (30 mg total) by mouth daily.   LANCETS (ONETOUCH DELICA PLUS [id number]) MISC    1 Device by Other route 3 (three) times daily. E11.42   LATANOPROST (XALATAN) 0.005 % OPHTHALMIC SOLUTION    Place 1 drop into both eyes at bedtime.   LIDOCAINE-PRILOCAINE (EMLA) CREAM    Apply 1 application topically every Monday, Wednesday, and Friday with hemodialysis.   MENTHOL-CAMPHOR (TIGER BALM ARTHRITIS RUB) 11-11 % CREA    Apply 1 Application topically daily as needed (pain).   MULTIVITAMIN (RENA-VIT) TABS TABLET    Take 1 tablet by mouth daily.   NITROGLYCERIN (NITROSTAT) 0.3 MG SL TABLET    PLACE 1 TABLET UNDER THE TONGUE AS NEEDED FOR CHEST PAIN   OCTREOTIDE (SANDOSTATIN LAR DEPOT) 10 MG INJECTION    Inject 10 mg into the muscle every 28 days.   ONDANSETRON (ZOFRAN) 4 MG TABLET    Take 1 tablet (4 mg total) by mouth every 8 (eight) hours as needed for nausea or vomiting.   OXYCODONE-ACETAMINOPHEN (PERCOCET) 5-325 MG TABLET    Take 1 tablet by mouth every 6 (six) hours as needed for severe pain.   POLYETHYLENE GLYCOL (MIRALAX / GLYCOLAX) 17 G PACKET    Take 17 g by mouth daily as needed for mild constipation.   TIMOLOL (TIMOPTIC) 0.5 % OPHTHALMIC SOLUTION    Place 1 drop into both eyes in the morning.   UMECLIDINIUM-VILANTEROL (ANORO ELLIPTA) 62.5-25 MCG/ACT AEPB    INHALE 1 PUFF BY MOUTH EVERY DAY  Modified Medications   No medications on file  Discontinued Medications   No medications on file    Physical Exam:  Vitals:   06/14/22 0852  BP: (!) 90/50  Pulse: 79  Temp: (!) 96.6 F (35.9 C)  SpO2: 98%  Weight: 185 lb 14.4 oz (84.3 kg)  Height: '5\' 9"'$  (1.753 m)   Body mass index is 27.45 kg/m. Wt Readings from Last 3 Encounters:  06/14/22 185 lb 14.4 oz (84.3 kg)  04/12/22 181 lb (82.1 kg)  03/24/22 179  lb 11.2 oz (81.5 kg)    Physical Exam Vitals and nursing note reviewed.  Constitutional:      Appearance: Normal appearance.  Cardiovascular:     Rate and Rhythm: Normal rate and regular rhythm.  Pulmonary:     Effort: Pulmonary effort is normal.     Breath sounds: Normal breath sounds.  Abdominal:     General: Abdomen is flat. Bowel sounds are normal.     Palpations: Abdomen is soft.  Musculoskeletal:        General: Normal range of motion.  Neurological:     General: No focal deficit present.     Mental Status: He is alert and oriented to person, place, and time.  Psychiatric:        Mood and Affect: Mood normal.        Behavior: Behavior normal.        Thought Content: Thought content normal.        Judgment: Judgment normal.     Labs reviewed: Basic Metabolic Panel: Recent Labs    06/24/21 0800 07/08/21 1929 01/21/22 0659 01/21/22 0721 02/24/22 0927 04/12/22 0847 05/07/22 0858 05/09/22 0000  NA 133*   < > 134*   < > 134* 134* 138  --   K 6.6*   < > 5.2*   < >  4.6 5.2* 5.1  --   CL 98   < > 91*   < > 92* 97* 96*  --   CO2 22   < > 27  --  31  --  25  --   GLUCOSE 133*   < > 194*   < > 148* 135* 132*  --   BUN 128*   < > 62*   < > 29* 49* 41* 14  CREATININE 11.14*   < > 10.54*   < > 6.53* 9.40* 9.16*  --   CALCIUM 9.3   < > 9.1  --  9.9  --  9.4 52.0*  PHOS 5.2*  --   --   --   --   --   --   --    < > = values in this interval not displayed.   Liver Function Tests: Recent Labs    06/23/21 1452 06/24/21 0800 10/07/21 0000 11/03/21 0000 01/21/22 0659 02/24/22 0927 05/09/22 0000  AST 22  --   --   --  16 23  --   ALT 18  --   --   --  11 11  --   ALKPHOS 60  --   --  84 55  --  96  BILITOT 0.4  --   --   --  0.6 0.4  --   PROT 7.7  --   --   --  7.1 7.6  --   ALBUMIN 2.6*   < > 4.2  --  3.2*  --  4.1   < > = values in this interval not displayed.   Recent Labs    01/21/22 0659  LIPASE 58*   No results for input(s): "AMMONIA" in the last 8760  hours. CBC: Recent Labs    06/23/21 1452 06/24/21 0800 01/21/22 0659 01/21/22 0721 02/24/22 0927 04/12/22 0847 05/07/22 0858  WBC 8.2   < > 6.3  --  4.0  --  4.5  NEUTROABS 6.0  --   --   --   --   --   --   HGB 8.2*   < > 9.2*   < > 11.6* 12.6* 11.1*  HCT 25.7*   < > 29.1*   < > 34.9* 37.0* 34.8*  MCV 97.3   < > 109.8*  --  103.9*  --  108.4*  PLT 228   < > 148*  --  125*  --  131*   < > = values in this interval not displayed.   Lipid Panel: Recent Labs    02/24/22 0927  CHOL 154  HDL 34*  LDLCALC 80  TRIG 332*  CHOLHDL 4.5   TSH: No results for input(s): "TSH" in the last 8760 hours. A1C: Lab Results  Component Value Date   HGBA1C 6.1 05/09/2022     Assessment/Plan  1. Opioid use Multiple chronic pain issues but primarily low back pain  2. Congestive heart failure, unspecified HF chronicity, unspecified heart failure type ([place]) Stable well compensated.  Fluid balances are well controlled on dialysis 3 3 times a week  3. Chronic bilateral low back pain without sciatica Nonsurgical.  Analgesic helps patient deal with chronic pain  4. Diabetic ulcer of right heel associated with type 2 diabetes mellitus, unspecified ulcer stage ([place]) Followed at wound care currently  5. Human immunodeficiency virus (HIV) disease ([place]) Followed by infectious disease.  Currently takes Biktarvy  6. Mixed hyperlipidemia On low-dose atorvastatin, 10 mg.  Last LDL was 82 months ago   Jacob Honey, MD [place] [phone]

## 2022-06-15 DIAGNOSIS — N2581 Secondary hyperparathyroidism of renal origin: Secondary | ICD-10-CM | POA: Diagnosis not present

## 2022-06-15 DIAGNOSIS — Z992 Dependence on renal dialysis: Secondary | ICD-10-CM | POA: Diagnosis not present

## 2022-06-15 DIAGNOSIS — N186 End stage renal disease: Secondary | ICD-10-CM | POA: Diagnosis not present

## 2022-06-15 DIAGNOSIS — D631 Anemia in chronic kidney disease: Secondary | ICD-10-CM | POA: Diagnosis not present

## 2022-06-16 ENCOUNTER — Encounter (HOSPITAL_BASED_OUTPATIENT_CLINIC_OR_DEPARTMENT_OTHER): Payer: Medicare Other | Admitting: General Surgery

## 2022-06-16 ENCOUNTER — Other Ambulatory Visit (HOSPITAL_COMMUNITY): Payer: Self-pay

## 2022-06-16 DIAGNOSIS — E1140 Type 2 diabetes mellitus with diabetic neuropathy, unspecified: Secondary | ICD-10-CM | POA: Diagnosis not present

## 2022-06-16 DIAGNOSIS — Z79899 Other long term (current) drug therapy: Secondary | ICD-10-CM | POA: Diagnosis not present

## 2022-06-16 DIAGNOSIS — H409 Unspecified glaucoma: Secondary | ICD-10-CM | POA: Diagnosis not present

## 2022-06-16 DIAGNOSIS — H42 Glaucoma in diseases classified elsewhere: Secondary | ICD-10-CM | POA: Diagnosis not present

## 2022-06-16 DIAGNOSIS — Z992 Dependence on renal dialysis: Secondary | ICD-10-CM | POA: Diagnosis not present

## 2022-06-16 DIAGNOSIS — N186 End stage renal disease: Secondary | ICD-10-CM | POA: Diagnosis not present

## 2022-06-16 DIAGNOSIS — E1122 Type 2 diabetes mellitus with diabetic chronic kidney disease: Secondary | ICD-10-CM | POA: Diagnosis not present

## 2022-06-16 DIAGNOSIS — E11621 Type 2 diabetes mellitus with foot ulcer: Secondary | ICD-10-CM | POA: Diagnosis not present

## 2022-06-16 DIAGNOSIS — I5042 Chronic combined systolic (congestive) and diastolic (congestive) heart failure: Secondary | ICD-10-CM | POA: Diagnosis not present

## 2022-06-16 DIAGNOSIS — L97512 Non-pressure chronic ulcer of other part of right foot with fat layer exposed: Secondary | ICD-10-CM | POA: Diagnosis not present

## 2022-06-16 DIAGNOSIS — J449 Chronic obstructive pulmonary disease, unspecified: Secondary | ICD-10-CM | POA: Diagnosis not present

## 2022-06-16 DIAGNOSIS — I2510 Atherosclerotic heart disease of native coronary artery without angina pectoris: Secondary | ICD-10-CM | POA: Diagnosis not present

## 2022-06-16 DIAGNOSIS — E1139 Type 2 diabetes mellitus with other diabetic ophthalmic complication: Secondary | ICD-10-CM | POA: Diagnosis not present

## 2022-06-16 DIAGNOSIS — N2581 Secondary hyperparathyroidism of renal origin: Secondary | ICD-10-CM | POA: Diagnosis not present

## 2022-06-16 DIAGNOSIS — M109 Gout, unspecified: Secondary | ICD-10-CM | POA: Diagnosis not present

## 2022-06-16 DIAGNOSIS — I132 Hypertensive heart and chronic kidney disease with heart failure and with stage 5 chronic kidney disease, or end stage renal disease: Secondary | ICD-10-CM | POA: Diagnosis not present

## 2022-06-16 DIAGNOSIS — I255 Ischemic cardiomyopathy: Secondary | ICD-10-CM | POA: Diagnosis not present

## 2022-06-16 NOTE — Progress Notes (Signed)
Jacob [name], Jacob [name] ([id number]) Visit Report for 06/16/2022 Chief Complaint Document Details Patient Name: Date of Service: Jacob [name], Jacob [name] 06/16/2022 10:30 A M Medical Record Number: [id number] Patient Account Number: [id number] Date of Birth/Sex: Treating RN: [birth year] (71 y.o. Jacob [name] Primary Care Provider: Alain Honey Other Clinician: Referring Provider: Treating Provider/Extender: Neil Crouch in Treatment: 15 Information Obtained from: Patient Chief Complaint Patient seen for complaints of Non-Healing Wounds x 3 Electronic Signature(s) Signed: 06/16/2022 11:05:04 AM By: Fredirick Maudlin MD FACS Entered By: Fredirick Maudlin on 06/16/2022 11:05:04 -------------------------------------------------------------------------------- Debridement Details Patient Name: Date of Service: Jacob [name]. 06/16/2022 10:30 A M Medical Record Number: [id number] Patient Account Number: [id number] Date of Birth/Sex: Treating RN: [birth year] (71 y.o. Jacob [name] Primary Care Provider: Alain Honey Other Clinician: Referring Provider: Treating Provider/Extender: Neil Crouch in Treatment: 15 Debridement Performed for Assessment: Wound #8 Right,Plantar Foot Performed By: Physician Fredirick Maudlin, MD Debridement Type: Debridement Severity of Tissue Pre Debridement: Fat layer exposed Level of Consciousness (Pre-procedure): Awake and Alert Pre-procedure Verification/Time Out Yes - 10:50 Taken: Start Time: 10:51 Pain Control: Lidocaine 5% topical ointment T Area Debrided (L x W): otal 1 (cm) x 1 (cm) = 1 (cm) Tissue and other material debrided: Non-Viable, Callus, Skin: Epidermis Level: Skin/Epidermis Debridement Description: Selective/Open Wound Instrument: Curette Bleeding: Minimum Hemostasis Achieved: Pressure Procedural Pain: 0 Post Procedural Pain: 0 Response to Treatment: Procedure was tolerated  well Level of Consciousness (Post- Awake and Alert procedure): Post Debridement Measurements of Total Wound Length: (cm) 0.7 Width: (cm) 0.7 Depth: (cm) 1 Volume: (cm) 0.385 Character of Wound/Ulcer Post Debridement: Improved Severity of Tissue Post Debridement: Fat layer exposed Post Procedure Diagnosis Same as Pre-procedure Electronic Signature(s) Signed: 06/16/2022 12:47:53 PM By: Fredirick Maudlin MD FACS Signed: 06/16/2022 5:23:24 PM By: Baruch Gouty RN, BSN Entered By: Baruch Gouty on 06/16/2022 10:54:33 -------------------------------------------------------------------------------- HPI Details Patient Name: Date of Service: Jacob Katos R. 06/16/2022 10:30 A M Medical Record Number: [id number] Patient Account Number: [id number] Date of Birth/Sex: Treating RN: [birth year] (71 y.o. Jacob [name] Primary Care Provider: Alain Honey Other Clinician: Referring Provider: Treating Provider/Extender: Neil Crouch in Treatment: 15 History of Present Illness Location: 2 ulcers noted to the left hallux Quality: the patient denies pain Duration: these were originally blisters towards the end of February and opened in early March Context: these wounds are secondary to pressure; he got new shoes in February and also started wearing a toe cover/sleeve Modifying Factors: pressure and friction are exacerbating factor HPI Description: 01/12/17- Mr. Welles arrives for initial evaluation of 2 ulcers to his left hallux. He states that back in February he did receive a new pair of diabetic shoes and at that time he started wearing toe cover/sleeves and noticed development of a blister to the dorsal and plantar aspect. He has been seen at [facility] by both Dr. Amalia Hailey and Dr. Jacqualyn Posey. He last saw Dr.Wagoner on 3/8 at which time he was prescribed Silvadene and Keflex. He completed that dose of Keflex and states that he got a phone call to extend the  Keflex prior to this appointment. He has a follow-up with Dr. Amalia Hailey on 3/26. He has been wearing a surgical shoe, no significant offloading to the plantar/medial ulcer but the strap does not extend over the dorsal ulcer. Topically he has been applying the Silvadene daily and a dry dressing. He is a diabetic, on insulin, with  a recent A1c of 6.2% (11/10/16). He is a current smoker, 0.5 PPD. He has no known diagnosis of arterial insufficiency. 01/20/17- The patient arrives for follow up evaluation of his ulcers to the left hallux. He admits to continuing to smoke approximately 0.5ppd. He states that his diet has been somewhat uncontrolled with higher than normal glucose levels, this seems circumstantial as he had a death in the family as food and schedule has been disrupted. he admits to wearing the surgical show with offloading modifications while out of the home, but has been ambulating barefoot around the home. he saw Dr. Amalia Hailey yesterday for toenail trimming and callus shaving to the right plantar foot and investigation of his ulcers. 01/26/17- he is here for follow-up evaluation of his left hallux ulcers. He states he has been significantly more compliant in the use of his offloading surgical shoe, with the exception of "getting up for a glass of water" he states that he has worn it with any ambulation. He states he has decreased his smoking too, admitting to only a cigarette since yesterday. He states his blood sugar this morning was 147 and yesterday was 116. He is voicing no complaints or concerns 02/02/17- He is here for follow-up evaluation of his left hallux ulcers. The dorsal ulcer is crusted over/healed. He states he has minimized his ambulation over this past week, much less than the previous week. He states his glucose levels have been between 120-150 about half of the time, otherwise greater than 150. He cannot correlate any specific dietary changes. He does have an appointment with a dietitian  next month. He was advised to keep a log of his glucose levels and his diet for that appointment. 02/06/17; patient comes in today for first application of the total contact cast. Her intake nurse reported the wounds look better therefore I did not actually see these before application of the cast. He'll return on Thursday for reapplication in the standard fashion 02/10/27- he is here for follow-up evaluation of his left plantar hallux ulcer. He came in Monday for application of a total contact cast. He states that he was tolerating that Monday, but noticed on Tuesday that his foot began to slide in the cast. He also is complaining of the heaviness of the cast, as he ambulates as his primary form of transportation. He continues to smoke,. He has not contacted his cardiologist regarding Nicorette gum. He states his blood sugars have been slightly elevated, this morning being 206. He states that he will complete an antibiotic tonight. He states this was prescribed by Dr. Jacqualyn Posey. He states that he has refilled this prescription twice, without direction to continue taking this medication. 02/16/17; patient wound about the same as last week per her description. Rolled edges of callus scan and nonviable subcutaneous tissue. Using Prisma. Apparently used topical antibiotic instead of the Prisma over the course of this week. He has not a total contact cast candidate secondary to ambulation needs and unsteadiness on his feet per discussion with our intake nurse 03/02/17 patient requires debridement. He has a small wound on the right great toe which probes however does not approach bone. What I can see of the base of this wound looks reasonably healthy and I think this is largely in offloading issue with the second toe crossing over the first. He did not tolerate a total contact cast has balance issues 5/171/8. 0.2x0.2x0.2 less depth. Using prisma 03/16/17; no major change. Still a small probing wound with copious  amounts  of surrounding callus skin nonviable subcutaneous tissue all of which debrided. Still using Prisma. I suspect the dimensions post debridement are about the same as listed above 0.2 x 0.2 x 0.2 03/23/17; no major change using Prisma. Once again a large amount of callus nonviable subcutaneous tissue around the wound. I went over the offloading issues with the patient and the nurse. He did not tolerate a total contact cast he uses public transportation among other issues. 03/30/17; no major change using Prisma. Once again a large amount of callus and nonviable tissue around the wound requires debridement. Saw Dr. Amalia Hailey who pared calluses on the other foot. 04/06/17; the wound is much smaller there is callus around this although I think I can see the base of the wound and I elected not to do any debridement. 04/13/17 this is a wound I did not debridement last week left callus intact. He arrives today with the area much in the same predicament however this time I remove the callus to find a deep probing hole. This is disappointing this is not go to bone. We have been using Prisma. 04/20/17 plantar left first toe. Again although a lot of circumferential callus around the wound which requires debridement however this does not appear to be a probing area. Culture I did last week show coag-negative staph. I thought this was probably not something that was worth treating but In the interim he was seen by his primary care with parotitis. He is now on clindamycin and Cipro and the Staphylococcus we cultured is Cipro sensitive [lugdunensis} 04/27/17; plantar left first toe. Again a lot of circumferential callus around a small open area. Removing the callus also nonviable subcutaneous tissue. The wound looks better today. He has completed his antibiotics for the parotitis. I didn't think additional antibiotics were necessary in terms of the wound 05/11/17; plantar first toe. I thought this might be closed this week  however once again he has a deep probing small hole. Using pickups and a scalpel debridement of thick callus and subcutaneous tissue from around the wound reveals a depth of about 0.4 cm. We have not been able to offload the area. There is no obvious infection here. No evidence of ischemia 05/18/17; since his last visit he's been in hospital for oral surgery apparently to remove a retained surgical screw. He has swelling of his jaw and has some discomfort. He arrives today with the plantar left first toe in the same predicament. No major change. Each time he comes in here he has thick adherent callus 05/25/17; arrives today with roughly the same condition of the plantar left great toe. Perhaps somewhat better less callus. There are no options for offloading. He is on his feet a lot and I think this is largely a pressure issue X-ray I did last week showed no evidence of osteomyelitis. He has a toe separator as the great toe tends to ride under the second toe when he is walking. I'm hopeful that this will allow for a little less pressure on the plantar aspect of the left great toe 06/07/2017 -- he is working hard on giving up smoking and other than that has been wearing his diabetic shoes and is also trying to offload as much as possible. 06/15/17; patient arrived in clinic today with a difficult area on his left medial great toe closed over. There is still amount of callus on this however I don't believe there is any underlying open wound. He has custom shoes with inserts. I  still think he is going to need to offload this area going forward. He also follows with Dr. Amalia Hailey of podiatry for routine foot care READMISSION 11/01/16; patient arrives in clinic today with a five-day history of a new area on the previously problematic left great toe. He is wearing his diabetic shoes and he apparently has a new pair coming. The patient is a type II diabetic with neuropathy. He follows with Dr. Amalia Hailey of podiatry for  pre-ulcerative callus on his bilateral feet last seen on 08/24/17. He has developed a large blister on top of his left great toe this is since gone down somewhat. He is not been dressing this specifically. He does not have a history of significant PAD previous ABIs in this clinic were1.1 11/06/17; left great toe has epithelialized since last time. I think this was caused by a insoles an issue pushing the toe against the dorsal aspect of the shoe itself has new diabetic shoes coming hopefully will have more forefoot depth. READMISSION 03/01/2022 This is a now 71 year old man with a past medical history significant for type 2 diabetes mellitus with end-stage renal disease on hemodialysis as as well as congestive heart failure, ischemic cardiomyopathy, hypertension, and prior diabetic foot ulcers. He has had multiple issues with his hemodialysis access. He had a prior left radiocephalic fistula that failed. In October 2020, he had a left brachiobasilic fistula. He apparently underwent a procedure at an outside vascular office on November 09, 2021. Subsequent to that, he developed a wound on his left posterior forearm just distal to the elbow as well as wounds on 2 of his left digits. He was diagnosed with steal syndrome and recently underwent ligation of his fistula with placement of a hemodialysis catheter. He is here today for assistance in healing the wounds that developed related to steal syndrome. He says that he has been applying Neosporin to the sites and they are fairly sensitive to the touch. 03/08/2022: The wounds on his fingers are much smaller and have just a little bit of accumulated thin eschar. The wound on his left posterior forearm is also smaller but has accumulated both slough and eschar. He also has a thick callus on his right fifth metatarsal head plantar surface and unfortunately, it appears that there is an open wound underneath the callus. We have been using Iodosorb with Hydrofera  Blue to all of his open wounds. Pain is much improved from last week. 03/15/2022: The wounds on his fingers have healed. The wound on his left posterior forearm is smaller and has a nice layer of granulation tissue with just a bit of accumulated slough. He has built up additional callus around the right fifth metatarsal head plantar surface, but the underlying wound has a good surface with granulation tissue present. We have been using Iodosorb with Hydrofera Blue. He did not endorse any pain this week. 03/22/2022: The wound on his left posterior forearm continues to contract. There is good granulation tissue present and just a little bit of periwound eschar and overlying slough. The right fifth metatarsal head wound has once again accumulated some callus and there is slough within the wound. The intake nurse measured a area of undermining from 7-10 o'clock. 03/29/2022: The patient saw podiatry this week for nail trim and they also pared back a number of calluses. The wound on his left posterior forearm is substantially smaller with just some overlying thin eschar. The right fifth metatarsal head has a bit of callus accumulation as well as some  slough in the wound bed but is otherwise clean. It is smaller today, as well. 04/05/2022: The wound on his left posterior forearm is nearly closed with just a small amount of overlying eschar. The right fifth metatarsal head wound is smaller again this week. There is some periwound callus accumulation and some slough in the wound bed. He is going to get new dialysis access placed next week. 04/19/2022: The wound on his left posterior forearm is closed. The right fifth metatarsal head wound is about the same size with periwound callus and slough accumulation. He did have a new right forearm PTFE AV graft placed last week. His right forearm and hand are quite swollen, warm, and red today. He was apparently seen in the emergency room yesterday and they did not think it  was infected and attributed the swelling to normal postop findings. 04/28/2022: The wound on his foot has accumulated a lot of periwound callus, but the actual open surface is quite small. There is just a little bit of slough on the surface. 05/03/2022: His wound is even smaller today with less periwound callus accumulation. He continues to build up slough but the underlying tissue is healthy and viable-appearing. 05/10/2022: The orifice of the wound continues to contract, but there is some undermining and periwound callus formation. No significant accumulation of slough. 05/17/2022: The wound is about the same size today, perhaps slightly smaller. He does have periwound callus accumulation, but it does not overlap or create any undermining. There is some slough in the base of the wound. 05/31/2022: The wound measured larger today on intake, but this may be secondary to the fairly aggressive debridement I performed at his last visit. Despite this, he has built up callus around the wound again. There is some epiboly around the orifice. There is slough within the wound and some undermining is present. His blood sugar remains very poorly controlled; it was 300 today. 06/16/2022: No real change in the size of his wound. He has callus accumulation around the perimeter. Last visit we changed his dressing to Prisma; I have not seen much in the way of filling in from the base yet. Electronic Signature(s) Signed: 06/16/2022 11:07:29 AM By: Fredirick Maudlin MD FACS Entered By: Fredirick Maudlin on 06/16/2022 11:07:29 -------------------------------------------------------------------------------- Physical Exam Details Patient Name: Date of Service: Jacob [name]. 06/16/2022 10:30 A M Medical Record Number: [id number] Patient Account Number: [id number] Date of Birth/Sex: Treating RN: 10-Nov-1950 (71 y.o. Jacob [name] Primary Care Provider: Alain Honey Other Clinician: Referring Provider: Treating  Provider/Extender: Neil Crouch in Treatment: 15 Constitutional . . . . No acute distress.[name] Respiratory Normal work of breathing on room air.. Notes 06/16/2022: No real change in the size of his wound. He has callus accumulation around the perimeter. Last visit we changed his dressing to Prisma; I have not seen much in the way of filling in from the base yet. Electronic Signature(s) Signed: 06/16/2022 11:08:12 AM By: Fredirick Maudlin MD FACS Entered By: Fredirick Maudlin on 06/16/2022 11:08:12 -------------------------------------------------------------------------------- Physician Orders Details Patient Name: Date of Service: Jacob [name]. 06/16/2022 10:30 A M Medical Record Number: [id number] Patient Account Number: [id number] Date of Birth/Sex: Treating RN: 02-May-1951 (71 y.o. Jacob [name] Primary Care Provider: Alain Honey Other Clinician: Referring Provider: Treating Provider/Extender: Neil Crouch in Treatment: 15 Verbal / Phone Orders: No Diagnosis Coding ICD-10 Coding Code Description [phone] Non-pressure chronic ulcer of other part of right foot with fat  layer exposed E11.22 Type 2 diabetes mellitus with diabetic chronic kidney disease J44.9 Chronic obstructive pulmonary disease, unspecified I50.42 Chronic combined systolic (congestive) and diastolic (congestive) heart failure I10 Essential (primary) hypertension N25.81 Secondary hyperparathyroidism of renal origin N18.6 End stage renal disease B20 Human immunodeficiency virus [HIV] disease Follow-up Appointments ppointment in 2 weeks. - Dr. Celine Ahr RM 1 Return A Thursday 9/7 @ 10:30 am Anesthetic Wound #8 Right,Plantar Foot (In clinic) Topical Lidocaine 5% applied to wound bed Bathing/ Shower/ Hygiene Other Bathing/Shower/Hygiene Orders/Instructions: - Change dressing after bathing Off-Loading Other: - wear custom diabetic shoes at al times when  walking Wound Treatment Wound #8 - Foot Wound Laterality: Plantar, Right Cleanser: Soap and Water Every Other Day/7 Days Discharge Instructions: May shower and wash wound with dial antibacterial soap and water prior to dressing change. Cleanser: Wound Cleanser (Generic) Every Other Day/7 Days Discharge Instructions: Cleanse the wound with wound cleanser prior to applying a clean dressing using gauze sponges, not tissue or cotton balls. Prim Dressing: Promogran Prisma Matrix, 4.34 (sq in) (silver collagen) Every Other Day/7 Days ary Discharge Instructions: Moisten collagen with saline or hydrogel Secondary Dressing: Drawtex 4x4 in Every Other Day/7 Days Discharge Instructions: Apply over primary dressing cut to fit into wound bed behind collagen Secondary Dressing: Optifoam Non-Adhesive Dressing, 4x4 in Every Other Day/7 Days Discharge Instructions: Apply over primary dressing cut to make foam donut Secondary Dressing: Woven Gauze Sponges 2x2 in Every Other Day/7 Days Discharge Instructions: Apply over primary dressing as directed. Secured With: 81M Medipore H Soft Cloth Surgical T ape, 4 x 10 (in/yd) Every Other Day/7 Days Discharge Instructions: Secure with tape as directed. Patient Medications llergies: Amphetamine Analogues A Notifications Medication Indication Start End prior to debridement 06/16/2022 lidocaine DOSE topical 5 % ointment - ointment topical Electronic Signature(s) Signed: 06/16/2022 12:47:53 PM By: Fredirick Maudlin MD FACS Entered By: Fredirick Maudlin on 06/16/2022 11:08:24 -------------------------------------------------------------------------------- Problem List Details Patient Name: Date of Service: Jacob [name]. 06/16/2022 10:30 A M Medical Record Number: [id number] Patient Account Number: [id number] Date of Birth/Sex: Treating RN: 26-Oct-1950 (71 y.o. Jacob [name] Primary Care Provider: Alain Honey Other Clinician: Referring Provider: Treating  Provider/Extender: Neil Crouch in Treatment: 15 Active Problems ICD-10 Encounter Code Description Active Date MDM Diagnosis [phone] Non-pressure chronic ulcer of other part of right foot with fat layer exposed 03/08/2022 No Yes E11.22 Type 2 diabetes mellitus with diabetic chronic kidney disease 03/01/2022 No Yes J44.9 Chronic obstructive pulmonary disease, unspecified 03/01/2022 No Yes I50.42 Chronic combined systolic (congestive) and diastolic (congestive) heart failure 03/01/2022 No Yes I10 Essential (primary) hypertension 03/01/2022 No Yes N25.81 Secondary hyperparathyroidism of renal origin 03/01/2022 No Yes N18.6 End stage renal disease 03/01/2022 No Yes B20 Human immunodeficiency virus [HIV] disease 03/01/2022 No Yes Inactive Problems Resolved Problems ICD-10 Code Description Active Date Resolved Date L98.499 Non-pressure chronic ulcer of skin of other sites with unspecified severity 03/01/2022 03/01/2022 Electronic Signature(s) Signed: 06/16/2022 11:04:49 AM By: Fredirick Maudlin MD FACS Entered By: Fredirick Maudlin on 06/16/2022 11:04:49 -------------------------------------------------------------------------------- Progress Note Details Patient Name: Date of Service: Jacob Katos R. 06/16/2022 10:30 A M Medical Record Number: [id number] Patient Account Number: [id number] Date of Birth/Sex: Treating RN: 28-Nov-1950 (71 y.o. Jacob [name] Primary Care Provider: Alain Honey Other Clinician: Referring Provider: Treating Provider/Extender: Neil Crouch in Treatment: 15 Subjective Chief Complaint Information obtained from Patient Patient seen for complaints of Non-Healing Wounds x 3 History of Present Illness (HPI) The following  HPI elements were documented for the patient's wound: Location: 2 ulcers noted to the left hallux Quality: the patient denies pain Duration: these were originally blisters towards the end of February  and opened in early March Context: these wounds are secondary to pressure; he got new shoes in February and also started wearing a toe cover/sleeve Modifying Factors: pressure and friction are exacerbating factor 01/12/17- Mr. Farquharson arrives for initial evaluation of 2 ulcers to his left hallux. He states that back in February he did receive a new pair of diabetic shoes and at that time he started wearing toe cover/sleeves and noticed development of a blister to the dorsal and plantar aspect. He has been seen at [facility] by both Dr. Amalia Hailey and Dr. Jacqualyn Posey. He last saw Dr.Wagoner on 3/8 at which time he was prescribed Silvadene and Keflex. He completed that dose of Keflex and states that he got a phone call to extend the Keflex prior to this appointment. He has a follow-up with Dr. Amalia Hailey on 3/26. He has been wearing a surgical shoe, no significant offloading to the plantar/medial ulcer but the strap does not extend over the dorsal ulcer. T opically he has been applying the Silvadene daily and a dry dressing. He is a diabetic, on insulin, with a recent A1c of 6.2% (11/10/16). He is a current smoker, 0.5 PPD. He has no known diagnosis of arterial insufficiency. 01/20/17- The patient arrives for follow up evaluation of his ulcers to the left hallux. He admits to continuing to smoke approximately 0.5ppd. He states that his diet has been somewhat uncontrolled with higher than normal glucose levels, this seems circumstantial as he had a death in the family as food and schedule has been disrupted. he admits to wearing the surgical show with offloading modifications while out of the home, but has been ambulating barefoot around the home. he saw Dr. Amalia Hailey yesterday for toenail trimming and callus shaving to the right plantar foot and investigation of his ulcers. 01/26/17- he is here for follow-up evaluation of his left hallux ulcers. He states he has been significantly more compliant in the use of his  offloading surgical shoe, with the exception of "getting up for a glass of water" he states that he has worn it with any ambulation. He states he has decreased his smoking too, admitting to only a cigarette since yesterday. He states his blood sugar this morning was 147 and yesterday was 116. He is voicing no complaints or concerns 02/02/17- He is here for follow-up evaluation of his left hallux ulcers. The dorsal ulcer is crusted over/healed. He states he has minimized his ambulation over this past week, much less than the previous week. He states his glucose levels have been between 120-150 about half of the time, otherwise greater than 150. He cannot correlate any specific dietary changes. He does have an appointment with a dietitian next month. He was advised to keep a log of his glucose levels and his diet for that appointment. 02/06/17; patient comes in today for first application of the total contact cast. Her intake nurse reported the wounds look better therefore I did not actually see these before application of the cast. He'll return on Thursday for reapplication in the standard fashion 02/10/27- he is here for follow-up evaluation of his left plantar hallux ulcer. He came in Monday for application of a total contact cast. He states that he was tolerating that Monday, but noticed on Tuesday that his foot began to slide in the  cast. He also is complaining of the heaviness of the cast, as he ambulates as his primary form of transportation. He continues to smoke,. He has not contacted his cardiologist regarding Nicorette gum. He states his blood sugars have been slightly elevated, this morning being 206. He states that he will complete an antibiotic tonight. He states this was prescribed by Dr. Jacqualyn Posey. He states that he has refilled this prescription twice, without direction to continue taking this medication. 02/16/17; patient wound about the same as last week per her description. Rolled edges of  callus scan and nonviable subcutaneous tissue. Using Prisma. Apparently used topical antibiotic instead of the Prisma over the course of this week. He has not a total contact cast candidate secondary to ambulation needs and unsteadiness on his feet per discussion with our intake nurse 03/02/17 patient requires debridement. He has a small wound on the right great toe which probes however does not approach bone. What I can see of the base of this wound looks reasonably healthy and I think this is largely in offloading issue with the second toe crossing over the first. He did not tolerate a total contact cast has balance issues 5/171/8. 0.2x0.2x0.2 less depth. Using prisma 03/16/17; no major change. Still a small probing wound with copious amounts of surrounding callus skin nonviable subcutaneous tissue all of which debrided. Still using Prisma. I suspect the dimensions post debridement are about the same as listed above 0.2 x 0.2 x 0.2 03/23/17; no major change using Prisma. Once again a large amount of callus nonviable subcutaneous tissue around the wound. I went over the offloading issues with the patient and the nurse. He did not tolerate a total contact cast he uses public transportation among other issues. 03/30/17; no major change using Prisma. Once again a large amount of callus and nonviable tissue around the wound requires debridement. Saw Dr. Amalia Hailey who pared calluses on the other foot. 04/06/17; the wound is much smaller there is callus around this although I think I can see the base of the wound and I elected not to do any debridement. 04/13/17 this is a wound I did not debridement last week left callus intact. He arrives today with the area much in the same predicament however this time I remove the callus to find a deep probing hole. This is disappointing this is not go to bone. We have been using Prisma. 04/20/17 plantar left first toe. Again although a lot of circumferential callus around the  wound which requires debridement however this does not appear to be a probing area. Culture I did last week show coag-negative staph. I thought this was probably not something that was worth treating but In the interim he was seen by his primary care with parotitis. He is now on clindamycin and Cipro and the Staphylococcus we cultured is Cipro sensitive [lugdunensis} 04/27/17; plantar left first toe. Again a lot of circumferential callus around a small open area. Removing the callus also nonviable subcutaneous tissue. The wound looks better today. He has completed his antibiotics for the parotitis. I didn't think additional antibiotics were necessary in terms of the wound 05/11/17; plantar first toe. I thought this might be closed this week however once again he has a deep probing small hole. Using pickups and a scalpel debridement of thick callus and subcutaneous tissue from around the wound reveals a depth of about 0.4 cm. We have not been able to offload the area. There is no obvious infection here. No evidence of ischemia  05/18/17; since his last visit he's been in hospital for oral surgery apparently to remove a retained surgical screw. He has swelling of his jaw and has some discomfort. He arrives today with the plantar left first toe in the same predicament. No major change. Each time he comes in here he has thick adherent callus 05/25/17; arrives today with roughly the same condition of the plantar left great toe. Perhaps somewhat better less callus. There are no options for offloading. He is on his feet a lot and I think this is largely a pressure issue X-ray I did last week showed no evidence of osteomyelitis. He has a toe separator as the great toe tends to ride under the second toe when he is walking. I'm hopeful that this will allow for a little less pressure on the plantar aspect of the left great toe 06/07/2017 -- he is working hard on giving up smoking and other than that has been wearing his  diabetic shoes and is also trying to offload as much as possible. 06/15/17; patient arrived in clinic today with a difficult area on his left medial great toe closed over. There is still amount of callus on this however I don't believe there is any underlying open wound. He has custom shoes with inserts. I still think he is going to need to offload this area going forward. He also follows with Dr. Amalia Hailey of podiatry for routine foot care READMISSION 11/01/16; patient arrives in clinic today with a five-day history of a new area on the previously problematic left great toe. He is wearing his diabetic shoes and he apparently has a new pair coming. The patient is a type II diabetic with neuropathy. He follows with Dr. Amalia Hailey of podiatry for pre-ulcerative callus on his bilateral feet last seen on 08/24/17. He has developed a large blister on top of his left great toe this is since gone down somewhat. He is not been dressing this specifically. He does not have a history of significant PAD previous ABIs in this clinic were1.1 11/06/17; left great toe has epithelialized since last time. I think this was caused by a insoles an issue pushing the toe against the dorsal aspect of the shoe itself has new diabetic shoes coming hopefully will have more forefoot depth. READMISSION 03/01/2022 This is a now 71 year old man with a past medical history significant for type 2 diabetes mellitus with end-stage renal disease on hemodialysis as as well as congestive heart failure, ischemic cardiomyopathy, hypertension, and prior diabetic foot ulcers. He has had multiple issues with his hemodialysis access. He had a prior left radiocephalic fistula that failed. In October 2020, he had a left brachiobasilic fistula. He apparently underwent a procedure at an outside vascular office on November 09, 2021. Subsequent to that, he developed a wound on his left posterior forearm just distal to the elbow as well as wounds on 2 of his left  digits. He was diagnosed with steal syndrome and recently underwent ligation of his fistula with placement of a hemodialysis catheter. He is here today for assistance in healing the wounds that developed related to steal syndrome. He says that he has been applying Neosporin to the sites and they are fairly sensitive to the touch. 03/08/2022: The wounds on his fingers are much smaller and have just a little bit of accumulated thin eschar. The wound on his left posterior forearm is also smaller but has accumulated both slough and eschar. He also has a thick callus on his right fifth  metatarsal head plantar surface and unfortunately, it appears that there is an open wound underneath the callus. We have been using Iodosorb with Hydrofera Blue to all of his open wounds. Pain is much improved from last week. 03/15/2022: The wounds on his fingers have healed. The wound on his left posterior forearm is smaller and has a nice layer of granulation tissue with just a bit of accumulated slough. He has built up additional callus around the right fifth metatarsal head plantar surface, but the underlying wound has a good surface with granulation tissue present. We have been using Iodosorb with Hydrofera Blue. He did not endorse any pain this week. 03/22/2022: The wound on his left posterior forearm continues to contract. There is good granulation tissue present and just a little bit of periwound eschar and overlying slough. The right fifth metatarsal head wound has once again accumulated some callus and there is slough within the wound. The intake nurse measured a area of undermining from 7-10 o'clock. 03/29/2022: The patient saw podiatry this week for nail trim and they also pared back a number of calluses. The wound on his left posterior forearm is substantially smaller with just some overlying thin eschar. The right fifth metatarsal head has a bit of callus accumulation as well as some slough in the wound bed but is  otherwise clean. It is smaller today, as well. 04/05/2022: The wound on his left posterior forearm is nearly closed with just a small amount of overlying eschar. The right fifth metatarsal head wound is smaller again this week. There is some periwound callus accumulation and some slough in the wound bed. He is going to get new dialysis access placed next week. 04/19/2022: The wound on his left posterior forearm is closed. The right fifth metatarsal head wound is about the same size with periwound callus and slough accumulation. He did have a new right forearm PTFE AV graft placed last week. His right forearm and hand are quite swollen, warm, and red today. He was apparently seen in the emergency room yesterday and they did not think it was infected and attributed the swelling to normal postop findings. 04/28/2022: The wound on his foot has accumulated a lot of periwound callus, but the actual open surface is quite small. There is just a little bit of slough on the surface. 05/03/2022: His wound is even smaller today with less periwound callus accumulation. He continues to build up slough but the underlying tissue is healthy and viable-appearing. 05/10/2022: The orifice of the wound continues to contract, but there is some undermining and periwound callus formation. No significant accumulation of slough. 05/17/2022: The wound is about the same size today, perhaps slightly smaller. He does have periwound callus accumulation, but it does not overlap or create any undermining. There is some slough in the base of the wound. 05/31/2022: The wound measured larger today on intake, but this may be secondary to the fairly aggressive debridement I performed at his last visit. Despite this, he has built up callus around the wound again. There is some epiboly around the orifice. There is slough within the wound and some undermining is present. His blood sugar remains very poorly controlled; it was 300 today. 06/16/2022:  No real change in the size of his wound. He has callus accumulation around the perimeter. Last visit we changed his dressing to Prisma; I have not seen much in the way of filling in from the base yet. Patient History Information obtained from Patient. Family History Diabetes -  Siblings,Father, Heart Disease - Father,Siblings, Hypertension - Father,Siblings, No family history of Cancer, Hereditary Spherocytosis, Kidney Disease, Lung Disease, Seizures, Stroke, Thyroid Problems, Tuberculosis. Social History Current every day smoker - 1/2 ppd / 66yr, Marital Status - Single, Alcohol Use - Moderate - 6 standard drinks per week, Drug Use - Prior History - Polysubstance Abuse; clean for 9 years, Caffeine Use - Rarely - tea. Medical History Eyes Patient has history of Cataracts - mild, Glaucoma Denies history of Optic Neuritis Ear/Nose/Mouth/Throat Denies history of Chronic sinus problems/congestion, Middle ear problems Hematologic/Lymphatic Patient has history of Anemia Denies history of Hemophilia, Human Immunodeficiency Virus, Lymphedema, Sickle Cell Disease Respiratory Patient has history of Chronic Obstructive Pulmonary Disease (COPD) Denies history of Aspiration, Asthma, Pneumothorax, Sleep Apnea, Tuberculosis Cardiovascular Patient has history of Congestive Heart Failure, Coronary Artery Disease, Hypertension, Myocardial Infarction - 2004 Denies history of Angina, Arrhythmia, Deep Vein Thrombosis, Hypotension, Peripheral Arterial Disease, Peripheral Venous Disease, Phlebitis, Vasculitis Gastrointestinal Patient has history of Hepatitis C - 1968 Denies history of Cirrhosis , Colitis, Crohnoos, Hepatitis A, Hepatitis B Endocrine Patient has history of Type II Diabetes Denies history of Type I Diabetes Genitourinary Denies history of End Stage Renal Disease Immunological Denies history of Lupus Erythematosus, Raynaudoos, Scleroderma Integumentary (Skin) Denies history of History of  Burn Musculoskeletal Patient has history of Gout, Osteoarthritis Denies history of Rheumatoid Arthritis, Osteomyelitis Neurologic Patient has history of Neuropathy Denies history of Dementia, Quadriplegia, Paraplegia, Seizure Disorder Oncologic Denies history of Received Chemotherapy, Received Radiation Psychiatric Denies history of Anorexia/bulimia, Confinement Anxiety Hospitalization/Surgery History - Colonoscopy. - CABG. - Laproscopic Cholecystectomy. - Left Hip Fx. - Left Jaw Fx. - Lumbar Laminectomy. Medical A Surgical History Notes nd Constitutional Symptoms (General Health) Insomnia Syncope Alcohol Dependence HIV Hematologic/Lymphatic Hyperlipidemia Hyperglycemia Cardiovascular CABG, ischemic cardiomyopathy Gastrointestinal GERD Genitourinary Syphilis CKD,Nocturia Immunological HIV Musculoskeletal Hip Fx BiLateral Bunions Unspecified Arthritis Neurologic chronic back pain Objective Constitutional No acute distress.. Vitals Time Taken: 10:12 AM, Height: 74 in, Weight: 172 lbs, BMI: 22.1, Temperature: 97.7 F, Pulse: 76 bpm, Respiratory Rate: 16 breaths/min, Blood Pressure: 106/65 mmHg. Respiratory Normal work of breathing on room air.. General Notes: 06/16/2022: No real change in the size of his wound. He has callus accumulation around the perimeter. Last visit we changed his dressing to Prisma; I have not seen much in the way of filling in from the base yet. Integumentary (Hair, Skin) Wound #8 status is Open. Original cause of wound was Blister. The date acquired was: 03/08/2022. The wound has been in treatment 14 weeks. [place] The wound measures 0.7cm length x 0.7cm width x 1cm depth; 0.385cm^2 area and 0.385cm^3 volume. There is Fat Layer (Subcutaneous Tissue) exposed. There is no tunneling noted, however, there is undermining starting at 5:00 and ending at 7:00 with a maximum distance of 0.6cm. There is a small amount of  serosanguineous drainage noted. The wound margin is thickened. There is large (67-100%) red granulation within the wound bed. There is a small (1-33%) amount of necrotic tissue within the wound bed including Eschar and Adherent Slough. 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 Chronic obstructive pulmonary disease, unspecified Chronic combined systolic (congestive) and diastolic (congestive) heart failure Essential (primary) hypertension Secondary hyperparathyroidism of renal origin End stage renal disease Human immunodeficiency virus [HIV] disease Procedures Wound #8 Pre-procedure diagnosis of Wound #8 is a Diabetic Wound/Ulcer of the Lower  Extremity located on the [place] .Severity of Tissue Pre Debridement is: Fat layer exposed. There was a Selective/Open Wound Skin/Epidermis Debridement with a total area of 1 sq cm performed by Fredirick Maudlin, MD. With the following instrument(s): Curette to remove Non-Viable tissue/material. Material removed includes Callus and Skin: Epidermis and after achieving pain control using Lidocaine 5% topical ointment. No specimens were taken. A time out was conducted at 10:50, prior to the start of the procedure. A Minimum amount of bleeding was controlled with Pressure. The procedure was tolerated well with a pain level of 0 throughout and a pain level of 0 following the procedure. Post Debridement Measurements: 0.7cm length x 0.7cm width x 1cm depth; 0.385cm^3 volume. Character of Wound/Ulcer Post Debridement is improved. Severity of Tissue Post Debridement is: Fat layer exposed. Post procedure Diagnosis Wound #8: Same as Pre-Procedure Plan Follow-up Appointments: Return Appointment in 2 weeks. - Dr. Celine Ahr RM 1 Thursday 9/7 @ 10:30 am Anesthetic: Wound #8 Right,Plantar Foot: (In clinic) Topical Lidocaine 5%  applied to wound bed Bathing/ Shower/ Hygiene: Other Bathing/Shower/Hygiene Orders/Instructions: - Change dressing after bathing Off-Loading: Other: - wear custom diabetic shoes at al times when walking The following medication(s) was prescribed: lidocaine topical 5 % ointment ointment topical for prior to debridement was prescribed at facility WOUND #8: - Foot Wound Laterality: Plantar, Right Cleanser: Soap and Water Every Other Day/7 Days Discharge Instructions: May shower and wash wound with dial antibacterial soap and water prior to dressing change. Cleanser: Wound Cleanser (Generic) Every Other Day/7 Days Discharge Instructions: Cleanse the wound with wound cleanser prior to applying a clean dressing using gauze sponges, not tissue or cotton balls. Prim Dressing: Promogran Prisma Matrix, 4.34 (sq in) (silver collagen) Every Other Day/7 Days ary Discharge Instructions: Moisten collagen with saline or hydrogel Secondary Dressing: Drawtex 4x4 in Every Other Day/7 Days Discharge Instructions: Apply over primary dressing cut to fit into wound bed behind collagen Secondary Dressing: Optifoam Non-Adhesive Dressing, 4x4 in Every Other Day/7 Days Discharge Instructions: Apply over primary dressing cut to make foam donut Secondary Dressing: Woven Gauze Sponges 2x2 in Every Other Day/7 Days Discharge Instructions: Apply over primary dressing as directed. Secured With: 57M Medipore H Soft Cloth Surgical T ape, 4 x 10 (in/yd) Every Other Day/7 Days Discharge Instructions: Secure with tape as directed. 06/16/2022: No real change in the size of his wound. He has callus accumulation around the perimeter. Last visit we changed his dressing to Prisma; I have not seen much in the way of filling in from the base yet. I used a curette to debride the callus from around the wound as well as to take biofilm off of the surface. We will continue with the Prisma silver collagen packed with drawtex for now.  Follow-up in 2 weeks. Electronic Signature(s) Signed: 06/16/2022 11:08:54 AM By: Fredirick Maudlin MD FACS Entered By: Fredirick Maudlin on 06/16/2022 11:08:54 -------------------------------------------------------------------------------- HxROS Details Patient Name: Date of Service: Jacob Katos R. 06/16/2022 10:30 A M Medical Record Number: [id number] Patient Account Number: [id number] Date of Birth/Sex: Treating RN: January 18, 1951 (71 y.o. Jacob [name] Primary Care Provider: Alain Honey Other Clinician: Referring Provider: Treating Provider/Extender: Neil Crouch in Treatment: 15 Information Obtained From Patient Constitutional Symptoms (General Health) Medical History: Past Medical History Notes: Insomnia Syncope Alcohol Dependence HIV Eyes Medical History: Positive for: Cataracts - mild; Glaucoma Negative for: Optic Neuritis Ear/Nose/Mouth/Throat Medical History: Negative for: Chronic sinus problems/congestion; Middle ear problems Hematologic/Lymphatic Medical History: Positive for: Anemia  Negative for: Hemophilia; Human Immunodeficiency Virus; Lymphedema; Sickle Cell Disease Past Medical History Notes: Hyperlipidemia Hyperglycemia Respiratory Medical History: Positive for: Chronic Obstructive Pulmonary Disease (COPD) Negative for: Aspiration; Asthma; Pneumothorax; Sleep Apnea; Tuberculosis Cardiovascular Medical History: Positive for: Congestive Heart Failure; Coronary Artery Disease; Hypertension; Myocardial Infarction - 2004 Negative for: Angina; Arrhythmia; Deep Vein Thrombosis; Hypotension; Peripheral Arterial Disease; Peripheral Venous Disease; Phlebitis; Vasculitis Past Medical History Notes: CABG, ischemic cardiomyopathy Gastrointestinal Medical History: Positive for: Hepatitis C - 1968 Negative for: Cirrhosis ; Colitis; Crohns; Hepatitis A; Hepatitis B Past Medical History Notes: GERD Endocrine Medical  History: Positive for: Type II Diabetes Negative for: Type I Diabetes Time with diabetes: 13 yrs Treated with: Insulin Blood sugar tested every day: Yes Tested : 2 times per day Blood sugar testing results: Breakfast: 100; Dinner: 95-126 Genitourinary Medical History: Negative for: End Stage Renal Disease Past Medical History Notes: Syphilis CKD,Nocturia Immunological Medical History: Negative for: Lupus Erythematosus; Raynauds; Scleroderma Past Medical History Notes: HIV Integumentary (Skin) Medical History: Negative for: History of Burn Musculoskeletal Medical History: Positive for: Gout; Osteoarthritis Negative for: Rheumatoid Arthritis; Osteomyelitis Past Medical History Notes: Hip Fx BiLateral Bunions Unspecified Arthritis Neurologic Medical History: Positive for: Neuropathy Negative for: Dementia; Quadriplegia; Paraplegia; Seizure Disorder Past Medical History Notes: chronic back pain Oncologic Medical History: Negative for: Received Chemotherapy; Received Radiation Psychiatric Medical History: Negative for: Anorexia/bulimia; Confinement Anxiety HBO Extended History Items Eyes: Eyes: Cataracts Glaucoma Immunizations Pneumococcal Vaccine: Received Pneumococcal Vaccination: Yes Received Pneumococcal Vaccination On or After 60th Birthday: Yes Implantable Devices No devices added Hospitalization / Surgery History Type of Hospitalization/Surgery Colonoscopy CABG Laproscopic Cholecystectomy Left Hip Fx Left Jaw Fx Lumbar Laminectomy Family and Social History Cancer: No; Diabetes: Yes - Siblings,Father; Heart Disease: Yes - Father,Siblings; Hereditary Spherocytosis: No; Hypertension: Yes - Father,Siblings; Kidney Disease: No; Lung Disease: No; Seizures: No; Stroke: No; Thyroid Problems: No; Tuberculosis: No; Current every day smoker - 1/2 ppd / 73yr; Marital Status - Single; Alcohol Use: Moderate - 6 standard drinks per week; Drug Use: Prior History -  Polysubstance Abuse; clean for 9 years; Caffeine Use: Rarely - tea; Financial Concerns: No; Food, Clothing or Shelter Needs: No; Support System Lacking: No; 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.22 Type 2 diabetes mellitus with diabetic chronic kidney disease J44.9 Chronic obstructive pulmonary disease, unspecified I50.42 Chronic combined systolic (congestive) and diastolic (congestive) heart failure I10 Essential (primary) hypertension N25.81 Secondary hyperparathyroidism of renal origin N18.6 End stage renal disease B20 Human immunodeficiency virus [HIV] disease Facility Procedures Physician Procedures : CPT4 Code Description Modifier [id number] 73220- WC PHYS LEVEL 3 - EST PT 25 ICD-10 Diagnosis Description L97.512 Non-pressure chronic ulcer of other part of right foot with fat layer exposed E11.22 Type 2 diabetes mellitus with diabetic chronic kidney  disease I50.42 Chronic combined systolic (congestive) and diastolic (congestive) heart failure N18.6 End stage renal disease Quantity: 1 : [id number] - WC PHYS DEBR WO ANESTH 20 SQ CM  ICD-10 Diagnosis Description L97.512 Non-pressure chronic ulcer of other part of  right foot with fat layer exposed Quantity: 1 Electronic Signature(s) Signed: 06/16/2022 11:09:16 AM By: Fredirick Maudlin MD FACS Entered By: Fredirick Maudlin on 06/16/2022 11:09:15

## 2022-06-16 NOTE — Addendum Note (Signed)
Addended by: Dan Maker on: 06/16/2022 01:39 PM   Modules accepted: Orders

## 2022-06-17 DIAGNOSIS — Z992 Dependence on renal dialysis: Secondary | ICD-10-CM | POA: Diagnosis not present

## 2022-06-17 DIAGNOSIS — D631 Anemia in chronic kidney disease: Secondary | ICD-10-CM | POA: Diagnosis not present

## 2022-06-17 DIAGNOSIS — N186 End stage renal disease: Secondary | ICD-10-CM | POA: Diagnosis not present

## 2022-06-17 DIAGNOSIS — N2581 Secondary hyperparathyroidism of renal origin: Secondary | ICD-10-CM | POA: Diagnosis not present

## 2022-06-17 NOTE — Progress Notes (Signed)
MAKENZIE, VITTORIO ([id number]) Visit Report for 06/16/2022 Arrival Information Details Patient Name: Date of Service: [name], MCCARLEY 06/16/2022 10:30 A M Medical Record Number: [id number] Patient Account Number: [id number] Date of Birth/Sex: Treating RN: [birth year] (71 y.o. [name] Primary Care Elloise Roark: Alain Honey Other Clinician: Referring Crixus Mcaulay: Treating Khayla Koppenhaver/Extender: Neil Crouch in Treatment: 15 Visit Information History Since Last Visit Added or deleted any medications: No Patient Arrived: [name] Any new allergies or adverse reactions: No Arrival Time: 10:11 Had a fall or experienced change in No Accompanied By: self activities of daily living that may affect Transfer Assistance: None risk of falls: Patient Identification Verified: Yes Signs or symptoms of abuse/neglect since last visito No Patient Requires Transmission-Based Precautions: No Hospitalized since last visit: No Patient Has Alerts: No Implantable device outside of the clinic excluding No cellular tissue based products placed in the center since last visit: Has Dressing in Place as Prescribed: Yes Pain Present Now: No Electronic Signature(s) Signed: 06/16/2022 5:51:42 PM By: Dellie Catholic RN Entered By: Dellie Catholic on 06/16/2022 10:11:34 -------------------------------------------------------------------------------- Encounter Discharge Information Details Patient Name: Date of Service: [name] 06/16/2022 10:30 A M Medical Record Number: [id number] Patient Account Number: [id number] Date of Birth/Sex: Treating RN: 1951-07-05 (71 y.o. [name] Primary Care Caydee Talkington: Alain Honey Other Clinician: Referring Jolinda Pinkstaff: Treating Airis Barbee/Extender: Neil Crouch in Treatment: 15 Encounter Discharge Information Items Post Procedure Vitals Discharge Condition: Stable Temperature (F): 97.7 Ambulatory Status:  Ambulatory Pulse (bpm): 76 Discharge Destination: Home Respiratory Rate (breaths/min): 18 Transportation: Private Auto Blood Pressure (mmHg): 106/65 Accompanied By: self Schedule Follow-up Appointment: Yes Clinical Summary of Care: Patient Declined Electronic Signature(s) Signed: 06/16/2022 5:23:24 PM By: Baruch Gouty RN, BSN Entered By: Baruch Gouty on 06/16/2022 11:00:07 -------------------------------------------------------------------------------- Lower Extremity Assessment Details Patient Name: Date of Service: [name] 06/16/2022 10:30 A M Medical Record Number: [id number] Patient Account Number: [id number] Date of Birth/Sex: Treating RN: 03-30-1951 (71 y.o. [name] Primary Care Dekota Kirlin: Alain Honey Other Clinician: Referring Shirlee Whitmire: Treating Viaan Knippenberg/Extender: Neil Crouch in Treatment: 15 Edema Assessment Assessed: [name]: No] Patrice Paradise: No] Edema: [Left: N] [Right: o] Calf Left: Right: Point of Measurement: From Medial Instep 32.8 cm Ankle Left: Right: Point of Measurement: From Medial Instep 21 cm Electronic Signature(s) Signed: 06/16/2022 5:51:42 PM By: Dellie Catholic RN Entered By: Dellie Catholic on 06/16/2022 10:14:13 -------------------------------------------------------------------------------- Multi Wound Chart Details Patient Name: Date of Service: [name] 06/16/2022 10:30 A M Medical Record Number: [id number] Patient Account Number: [id number] Date of Birth/Sex: Treating RN: 1951/08/16 (71 y.o. [name] Primary Care Sharren Schnurr: Alain Honey Other Clinician: Referring Miaya Lafontant: Treating Korey Prashad/Extender: Neil Crouch in Treatment: 15 Vital Signs Height(in): 44 Pulse(bpm): 83 Weight(lbs): 172 Blood Pressure(mmHg): 106/65 Body Mass Index(BMI): 22.1 Temperature(F): 97.7 Respiratory Rate(breaths/min): 16 Photos: [N/A:N/A] Right, Plantar  Foot N/A N/A Wound Location: Blister N/A N/A Wounding Event: Diabetic Wound/Ulcer of the Lower N/A N/A Primary Etiology: Extremity Cataracts, Glaucoma, Anemia, Chronic N/A N/A Comorbid History: Obstructive Pulmonary Disease (COPD), Congestive Heart Failure, Coronary Artery Disease, Hypertension, Myocardial Infarction, Hepatitis C, Type II Diabetes, Gout, Osteoarthritis, Neuropathy 03/08/2022 N/A N/A Date Acquired: 14 N/A N/A Weeks of Treatment: Open N/A N/A Wound Status: No N/A N/A Wound Recurrence: 0.7x0.7x1 N/A N/A Measurements L x W x D (cm) 0.385 N/A N/A A (cm) : rea 0.385 N/A N/A Volume (cm) : 65.00% N/A N/A % Reduction  in A rea: -16.70% N/A N/A % Reduction in Volume: 5 Starting Position 1 (o'clock): 7 Ending Position 1 (o'clock): 0.6 Maximum Distance 1 (cm): Yes N/A N/A Undermining: Grade 2 N/A N/A Classification: Small N/A N/A Exudate A mount: Serosanguineous N/A N/A Exudate Type: red, brown N/A N/A Exudate Color: Thickened N/A N/A Wound Margin: Large (67-100%) N/A N/A Granulation A mount: Red N/A N/A Granulation Quality: Small (1-33%) N/A N/A Necrotic A mount: Eschar, Adherent Slough N/A N/A Necrotic Tissue: Fat Layer (Subcutaneous Tissue): Yes N/A N/A Exposed Structures: Fascia: No Tendon: No Muscle: No Joint: No Bone: No Small (1-33%) N/A N/A Epithelialization: Debridement - Selective/Open Wound N/A N/A Debridement: Pre-procedure Verification/Time Out 10:50 N/A N/A Taken: Lidocaine 5% topical ointment N/A N/A Pain Control: Callus N/A N/A Tissue Debrided: Skin/Epidermis N/A N/A Level: 1 N/A N/A Debridement A (sq cm): rea Curette N/A N/A Instrument: Minimum N/A N/A Bleeding: Pressure N/A N/A Hemostasis A chieved: 0 N/A N/A Procedural Pain: 0 N/A N/A Post Procedural Pain: Procedure was tolerated well N/A N/A Debridement Treatment Response: 0.7x0.7x1 N/A N/A Post Debridement Measurements L x W x D (cm) 0.385 N/A  N/A Post Debridement Volume: (cm) Debridement N/A N/A Procedures Performed: Treatment Notes Wound #8 (Foot) Wound Laterality: Plantar, Right Cleanser Soap and Water Discharge Instruction: May shower and wash wound with dial antibacterial soap and water prior to dressing change. Wound Cleanser Discharge Instruction: Cleanse the wound with wound cleanser prior to applying a clean dressing using gauze sponges, not tissue or cotton balls. Peri-Wound Care Topical Primary Dressing Promogran Prisma Matrix, 4.34 (sq in) (silver collagen) Discharge Instruction: Moisten collagen with saline or hydrogel Secondary Dressing Drawtex 4x4 in Discharge Instruction: Apply over primary dressing cut to fit into wound bed behind collagen Optifoam Non-Adhesive Dressing, 4x4 in Discharge Instruction: Apply over primary dressing cut to make foam donut Woven Gauze Sponges 2x2 in Discharge Instruction: Apply over primary dressing as directed. Secured With 25M Medipore H Soft Cloth Surgical T ape, 4 x 10 (in/yd) Discharge Instruction: Secure with tape as directed. Compression Wrap Compression Stockings Add-Ons Electronic Signature(s) Signed: 06/16/2022 11:04:56 AM By: Fredirick Maudlin MD FACS Signed: 06/16/2022 5:23:24 PM By: Baruch Gouty RN, BSN Entered By: Fredirick Maudlin on 06/16/2022 11:04:56 -------------------------------------------------------------------------------- Multi-Disciplinary Care Plan Details Patient Name: Date of Service: [name] 06/16/2022 10:30 A M Medical Record Number: [id number] Patient Account Number: [id number] Date of Birth/Sex: Treating RN: 04/15/1951 (71 y.o. Waldron Session Primary Care Bentlie Catanzaro: Alain Honey Other Clinician: Referring Stefan Karen: Treating Bettymae Yott/Extender: Neil Crouch in Treatment: [place] reviewed with physician Active Inactive Nutrition Nursing Diagnoses: Impaired glucose  control: actual or potential Potential for alteratiion in Nutrition/Potential for imbalanced nutrition Goals: Patient/caregiver will maintain therapeutic glucose control Date Initiated: 05/10/2022 Target Resolution Date: 07/14/2022 Goal Status: Active Interventions: Assess HgA1c results as ordered upon admission and as needed Provide education on elevated blood sugars and impact on wound healing Treatment Activities: Dietary management education, guidance and counseling : 05/10/2022 Patient referred to Primary Care Physician for further nutritional evaluation : 05/10/2022 Notes: Wound/Skin Impairment Nursing Diagnoses: Impaired tissue integrity Goals: Patient/caregiver will verbalize understanding of skin care regimen Date Initiated: 03/01/2022 Target Resolution Date: 07/14/2022 Goal Status: Active Interventions: Assess ulceration(s) every visit Treatment Activities: Skin care regimen initiated : 03/01/2022 Notes: Electronic Signature(s) Signed: 06/17/2022 7:35:53 AM By: Blanche East RN Signed: 06/17/2022 7:35:53 AM By: Blanche East RN Entered By: Blanche East on 06/16/2022 10:20:47 -------------------------------------------------------------------------------- Pain Assessment Details Patient Name: Date of Service:  CORBETT, MOULDER 06/16/2022 10:30 A M Medical Record Number: [id number] Patient Account Number: [id number] Date of Birth/Sex: Treating RN: 1951/10/11 (71 y.o. [name] Primary Care Tracia Lacomb: Alain Honey Other Clinician: Referring Cleopha Indelicato: Treating Amil Moseman/Extender: Neil Crouch in Treatment: 15 Active Problems Location of Pain Severity and Description of Pain Patient Has Paino No Site Locations Pain Management and Medication Current Pain Management: Electronic Signature(s) Signed: 06/16/2022 5:51:42 PM By: Dellie Catholic RN Entered By: Dellie Catholic on 06/16/2022  10:12:18 -------------------------------------------------------------------------------- Patient/Caregiver Education Details Patient Name: Date of Service: [name] 8/24/2023andnbsp10:30 A M Medical Record Number: [id number] Patient Account Number: [id number] Date of Birth/Gender: Treating RN: 01/10/1951 (71 y.o. Waldron Session Primary Care Physician: Alain Honey Other Clinician: Referring Physician: Treating Physician/Extender: Neil Crouch in Treatment: 15 Education Assessment Education Provided To: Patient Education Topics Provided Elevated Blood Sugar/ Impact on Healing: Methods: Explain/Verbal Responses: Reinforcements needed, State content correctly Electronic Signature(s) Signed: 06/17/2022 7:35:53 AM By: Blanche East RN Entered By: Blanche East on 06/16/2022 10:21:24 -------------------------------------------------------------------------------- Wound Assessment Details Patient Name: Date of Service: [name] 06/16/2022 10:30 A M Medical Record Number: [id number] Patient Account Number: [id number] Date of Birth/Sex: Treating RN: 21-Mar-1951 (71 y.o. [name] Primary Care Timotheus Salm: Alain Honey Other Clinician: Referring Ahnesti Townsend: Treating Hussam Muniz/Extender: Neil Crouch in Treatment: 15 Wound Status Wound Number: 8 Primary Diabetic Wound/Ulcer of the Lower Extremity Etiology: Wound Location: Right, Plantar Foot Wound Open Wounding Event: Blister Status: Date Acquired: 03/08/2022 Comorbid Cataracts, Glaucoma, Anemia, Chronic Obstructive Pulmonary Weeks Of Treatment: 14 History: Disease (COPD), Congestive Heart Failure, Coronary Artery Clustered Wound: No Disease, Hypertension, Myocardial Infarction, Hepatitis C, Type II Diabetes, Gout, Osteoarthritis, Neuropathy Photos Wound Measurements Length: (cm) 0.7 Width: (cm) 0.7 Depth: (cm) 1 Area: (cm) 0.385 Volume:  (cm) 0.385 % Reduction in Area: 65% % Reduction in Volume: -16.7% Epithelialization: Small (1-33%) Tunneling: No Undermining: Yes Starting Position (o'clock): 5 Ending Position (o'clock): 7 Maximum Distance: (cm) 0.6 Wound Description Classification: Grade 2 Wound Margin: Thickened Exudate Amount: Small Exudate Type: Serosanguineous Exudate Color: red, brown Foul Odor After Cleansing: No Slough/Fibrino Yes Wound Bed Granulation Amount: Large (67-100%) Exposed Structure Granulation Quality: Red Fascia Exposed: No Necrotic Amount: Small (1-33%) Fat Layer (Subcutaneous Tissue) Exposed: Yes Necrotic Quality: Eschar, Adherent Slough Tendon Exposed: No Muscle Exposed: No Joint Exposed: No Bone Exposed: No Treatment Notes Wound #8 (Foot) Wound Laterality: Plantar, Right Cleanser Soap and Water Discharge Instruction: May shower and wash wound with dial antibacterial soap and water prior to dressing change. Wound Cleanser Discharge Instruction: Cleanse the wound with wound cleanser prior to applying a clean dressing using gauze sponges, not tissue or cotton balls. Peri-Wound Care Topical Primary Dressing Promogran Prisma Matrix, 4.34 (sq in) (silver collagen) Discharge Instruction: Moisten collagen with saline or hydrogel Secondary Dressing Drawtex 4x4 in Discharge Instruction: Apply over primary dressing cut to fit into wound bed behind collagen Optifoam Non-Adhesive Dressing, 4x4 in Discharge Instruction: Apply over primary dressing cut to make foam donut Woven Gauze Sponges 2x2 in Discharge Instruction: Apply over primary dressing as directed. Secured With 79M Medipore H Soft Cloth Surgical T ape, 4 x 10 (in/yd) Discharge Instruction: Secure with tape as directed. Compression Wrap Compression Stockings Add-Ons Electronic Signature(s) Signed: 06/16/2022 5:51:42 PM By: Dellie Catholic RN Signed: 06/17/2022 7:35:53 AM By: Blanche East RN Entered By: Blanche East on  06/16/2022 10:20:07 -------------------------------------------------------------------------------- Vitals Details Patient Name: Date of Service:  JENCARLO, BONADONNA 06/16/2022 10:30 A M Medical Record Number: [id number] Patient Account Number: [id number] Date of Birth/Sex: Treating RN: 05/23/1951 (71 y.o. [name] Primary Care Aragon Scarantino: Alain Honey Other Clinician: Referring Dameian Crisman: Treating Bailyn Spackman/Extender: Neil Crouch in Treatment: 15 Vital Signs Time Taken: 10:12 Temperature (F): 97.7 Height (in): 74 Pulse (bpm): 76 Weight (lbs): 172 Respiratory Rate (breaths/min): 16 Body Mass Index (BMI): 22.1 Blood Pressure (mmHg): 106/65 Reference Range: 80 - 120 mg / dl Electronic Signature(s) Signed: 06/16/2022 5:51:42 PM By: Dellie Catholic RN Entered By: Dellie Catholic on 06/16/2022 10:12:04

## 2022-06-20 ENCOUNTER — Other Ambulatory Visit (HOSPITAL_COMMUNITY): Payer: Self-pay

## 2022-06-20 DIAGNOSIS — Z992 Dependence on renal dialysis: Secondary | ICD-10-CM | POA: Diagnosis not present

## 2022-06-20 DIAGNOSIS — N2581 Secondary hyperparathyroidism of renal origin: Secondary | ICD-10-CM | POA: Diagnosis not present

## 2022-06-20 DIAGNOSIS — D631 Anemia in chronic kidney disease: Secondary | ICD-10-CM | POA: Diagnosis not present

## 2022-06-20 DIAGNOSIS — N186 End stage renal disease: Secondary | ICD-10-CM | POA: Diagnosis not present

## 2022-06-21 ENCOUNTER — Telehealth: Payer: Self-pay | Admitting: *Deleted

## 2022-06-21 DIAGNOSIS — H02051 Trichiasis without entropian right upper eyelid: Secondary | ICD-10-CM | POA: Diagnosis not present

## 2022-06-21 DIAGNOSIS — H401132 Primary open-angle glaucoma, bilateral, moderate stage: Secondary | ICD-10-CM | POA: Diagnosis not present

## 2022-06-21 LAB — DRUG TOX MONITOR 1 W/CONF, ORAL FLD
Amphetamines: NEGATIVE ng/mL (ref <10)
Barbiturates: NEGATIVE ng/mL (ref <10)
Benzodiazepines: NEGATIVE ng/mL (ref <0.50)
Buprenorphine: NEGATIVE ng/mL (ref <0.10)
Cocaine: NEGATIVE ng/mL (ref <5.0)
Codeine: NEGATIVE ng/mL (ref <2.5)
Cotinine: >250 ng/mL — ABNORMAL HIGH (ref <5.0)
Dihydrocodeine: NEGATIVE ng/mL (ref <2.5)
Fentanyl: NEGATIVE ng/mL (ref <0.10)
Heroin Metabolite: NEGATIVE ng/mL (ref <1.0)
Hydrocodone: NEGATIVE ng/mL (ref <2.5)
Hydromorphone: NEGATIVE ng/mL (ref <2.5)
MARIJUANA: NEGATIVE ng/mL (ref <2.5)
MDMA: NEGATIVE ng/mL (ref <10)
Meprobamate: NEGATIVE ng/mL (ref <2.5)
Methadone: NEGATIVE ng/mL (ref <5.0)
Morphine: NEGATIVE ng/mL (ref <2.5)
Nicotine Metabolite: POSITIVE ng/mL — AB (ref <5.0)
Norhydrocodone: NEGATIVE ng/mL (ref <2.5)
Noroxycodone: 17.8 ng/mL — ABNORMAL HIGH (ref <2.5)
Opiates: POSITIVE ng/mL — AB (ref <2.5)
Oxycodone: 217.9 ng/mL — ABNORMAL HIGH (ref <2.5)
Oxymorphone: 3.3 ng/mL — ABNORMAL HIGH (ref <2.5)
Phencyclidine: NEGATIVE ng/mL (ref <10)
Tapentadol: NEGATIVE ng/mL (ref <5.0)
Tramadol: NEGATIVE ng/mL (ref <5.0)
Zolpidem: NEGATIVE ng/mL (ref <5.0)

## 2022-06-21 NOTE — Telephone Encounter (Signed)
Patient called and stated that you and him were talking about a guy that goes to Dialysis with him that gets 90 day supply of his pain medication and you wanted to know where this guy went.   Stated that he goes to [facility].   Patient stated that he still wants to get his pain medication through you and will call on Wednesdays to receive.    FYI

## 2022-06-22 ENCOUNTER — Ambulatory Visit: Payer: Medicare Other | Admitting: Family Medicine

## 2022-06-22 DIAGNOSIS — N2581 Secondary hyperparathyroidism of renal origin: Secondary | ICD-10-CM | POA: Diagnosis not present

## 2022-06-22 DIAGNOSIS — D631 Anemia in chronic kidney disease: Secondary | ICD-10-CM | POA: Diagnosis not present

## 2022-06-22 DIAGNOSIS — N186 End stage renal disease: Secondary | ICD-10-CM | POA: Diagnosis not present

## 2022-06-22 DIAGNOSIS — Z992 Dependence on renal dialysis: Secondary | ICD-10-CM | POA: Diagnosis not present

## 2022-06-23 ENCOUNTER — Encounter (HOSPITAL_COMMUNITY)
Admission: RE | Admit: 2022-06-23 | Discharge: 2022-06-23 | Disposition: A | Discharge status: Home / Self Care | Payer: Medicare Other | Source: Ambulatory Visit | Attending: Gastroenterology | Admitting: Gastroenterology

## 2022-06-23 DIAGNOSIS — Q2730 Arteriovenous malformation, site unspecified: Secondary | ICD-10-CM | POA: Diagnosis not present

## 2022-06-23 DIAGNOSIS — I129 Hypertensive chronic kidney disease with stage 1 through stage 4 chronic kidney disease, or unspecified chronic kidney disease: Secondary | ICD-10-CM | POA: Diagnosis not present

## 2022-06-23 DIAGNOSIS — N186 End stage renal disease: Secondary | ICD-10-CM | POA: Diagnosis not present

## 2022-06-23 DIAGNOSIS — Z992 Dependence on renal dialysis: Secondary | ICD-10-CM | POA: Diagnosis not present

## 2022-06-23 MED ORDER — OCTREOTIDE ACETATE 10 MG IM KIT
10.0000 mg | PACK | INTRAMUSCULAR | Status: DC
  Administered 2022-06-23: 10 mg via INTRAMUSCULAR
  Filled 2022-06-23 (×2): qty 1

## 2022-06-24 DIAGNOSIS — N2581 Secondary hyperparathyroidism of renal origin: Secondary | ICD-10-CM | POA: Diagnosis not present

## 2022-06-24 DIAGNOSIS — D631 Anemia in chronic kidney disease: Secondary | ICD-10-CM | POA: Diagnosis not present

## 2022-06-24 DIAGNOSIS — Z992 Dependence on renal dialysis: Secondary | ICD-10-CM | POA: Diagnosis not present

## 2022-06-24 DIAGNOSIS — N186 End stage renal disease: Secondary | ICD-10-CM | POA: Diagnosis not present

## 2022-06-24 DIAGNOSIS — D689 Coagulation defect, unspecified: Secondary | ICD-10-CM | POA: Diagnosis not present

## 2022-06-27 DIAGNOSIS — D631 Anemia in chronic kidney disease: Secondary | ICD-10-CM | POA: Diagnosis not present

## 2022-06-27 DIAGNOSIS — Z992 Dependence on renal dialysis: Secondary | ICD-10-CM | POA: Diagnosis not present

## 2022-06-27 DIAGNOSIS — D689 Coagulation defect, unspecified: Secondary | ICD-10-CM | POA: Diagnosis not present

## 2022-06-27 DIAGNOSIS — N2581 Secondary hyperparathyroidism of renal origin: Secondary | ICD-10-CM | POA: Diagnosis not present

## 2022-06-27 DIAGNOSIS — N186 End stage renal disease: Secondary | ICD-10-CM | POA: Diagnosis not present

## 2022-06-28 ENCOUNTER — Other Ambulatory Visit: Payer: Self-pay | Admitting: *Deleted

## 2022-06-28 MED FILL — Octreotide Acetate For IM Inj Kit 10 MG: INTRAMUSCULAR | Qty: 1 | Status: CN

## 2022-06-28 NOTE — Telephone Encounter (Signed)
Patient requested refill.  Epic LR: 06/13/2022 Contract Date: 07/20/2021 Pended Rx and sent to Dr. Sabra Heck for approval.

## 2022-06-29 DIAGNOSIS — N186 End stage renal disease: Secondary | ICD-10-CM | POA: Diagnosis not present

## 2022-06-29 DIAGNOSIS — Z992 Dependence on renal dialysis: Secondary | ICD-10-CM | POA: Diagnosis not present

## 2022-06-29 DIAGNOSIS — D631 Anemia in chronic kidney disease: Secondary | ICD-10-CM | POA: Diagnosis not present

## 2022-06-29 DIAGNOSIS — D689 Coagulation defect, unspecified: Secondary | ICD-10-CM | POA: Diagnosis not present

## 2022-06-29 DIAGNOSIS — N2581 Secondary hyperparathyroidism of renal origin: Secondary | ICD-10-CM | POA: Diagnosis not present

## 2022-06-29 MED ORDER — OXYCODONE-ACETAMINOPHEN 5-325 MG PO TABS: 1.0000 | ORAL_TABLET | Freq: Four times a day (QID) | ORAL | 0 refills | Status: DC | PRN

## 2022-06-30 ENCOUNTER — Encounter (HOSPITAL_BASED_OUTPATIENT_CLINIC_OR_DEPARTMENT_OTHER): Payer: Medicare Other | Attending: General Surgery | Admitting: General Surgery

## 2022-06-30 DIAGNOSIS — L97512 Non-pressure chronic ulcer of other part of right foot with fat layer exposed: Secondary | ICD-10-CM | POA: Insufficient documentation

## 2022-06-30 DIAGNOSIS — I5042 Chronic combined systolic (congestive) and diastolic (congestive) heart failure: Secondary | ICD-10-CM | POA: Insufficient documentation

## 2022-06-30 DIAGNOSIS — N2581 Secondary hyperparathyroidism of renal origin: Secondary | ICD-10-CM | POA: Diagnosis not present

## 2022-06-30 DIAGNOSIS — E11621 Type 2 diabetes mellitus with foot ulcer: Secondary | ICD-10-CM | POA: Insufficient documentation

## 2022-06-30 DIAGNOSIS — E1122 Type 2 diabetes mellitus with diabetic chronic kidney disease: Secondary | ICD-10-CM | POA: Diagnosis not present

## 2022-06-30 DIAGNOSIS — B20 Human immunodeficiency virus [HIV] disease: Secondary | ICD-10-CM | POA: Diagnosis not present

## 2022-06-30 DIAGNOSIS — I132 Hypertensive heart and chronic kidney disease with heart failure and with stage 5 chronic kidney disease, or end stage renal disease: Secondary | ICD-10-CM | POA: Diagnosis not present

## 2022-06-30 DIAGNOSIS — N182 Chronic kidney disease, stage 2 (mild): Secondary | ICD-10-CM | POA: Diagnosis not present

## 2022-06-30 DIAGNOSIS — J449 Chronic obstructive pulmonary disease, unspecified: Secondary | ICD-10-CM | POA: Insufficient documentation

## 2022-06-30 NOTE — Progress Notes (Signed)
DONTERRIUS, SANTUCCI ([id number]) Visit Report for 06/30/2022 Chief Complaint Document Details Patient Name: Date of Service: Jacob [name], Jacob [name] 06/30/2022 10:30 A M Medical Record Number: [id number] Patient Account Number: [id number] Date of Birth/Sex: Treating RN: 1951-07-05 (71 y.o. [name] Primary Care Provider: Alain Honey Other Clinician: Referring Provider: Treating Provider/Extender: Neil Crouch in Treatment: 17 Information Obtained from: Patient Chief Complaint Patient seen for complaints of Non-Healing Wounds x 3 Electronic Signature(s) Signed: 06/30/2022 10:25:21 AM By: Fredirick Maudlin MD FACS Entered By: Fredirick Maudlin on 06/30/2022 10:25:21 -------------------------------------------------------------------------------- Debridement Details Patient Name: Date of Service: Jacob Ache. 06/30/2022 10:30 A M Medical Record Number: [id number] Patient Account Number: [id number] Date of Birth/Sex: Treating RN: 10-Jun-1951 (71 y.o. [name] Primary Care Provider: Alain Honey Other Clinician: Referring Provider: Treating Provider/Extender: Neil Crouch in Treatment: 17 Debridement Performed for Assessment: Wound #8 Right,Plantar Foot Performed By: Physician Fredirick Maudlin, MD Debridement Type: Debridement Severity of Tissue Pre Debridement: Fat layer exposed Level of Consciousness (Pre-procedure): Awake and Alert Pre-procedure Verification/Time Out Yes - 10:05 Taken: Start Time: 10:10 Pain Control: Lidocaine 4% T opical Solution T Area Debrided (L x W): otal 1.5 (cm) x 1.5 (cm) = 2.25 (cm) Tissue and other material debrided: Viable, Non-Viable, Callus, Subcutaneous, Skin: Epidermis Level: Skin/Subcutaneous Tissue Debridement Description: Excisional Instrument: Curette Bleeding: Minimum Hemostasis Achieved: Pressure Procedural Pain: 0 Post Procedural Pain: 0 Response to Treatment:  Procedure was tolerated well Level of Consciousness (Post- Awake and Alert procedure): Post Debridement Measurements of Total Wound Length: (cm) 1 Width: (cm) 1.2 Depth: (cm) 1.2 Volume: (cm) 1.131 Character of Wound/Ulcer Post Debridement: Improved Severity of Tissue Post Debridement: Fat layer exposed Post Procedure Diagnosis Same as Pre-procedure Electronic Signature(s) Signed: 06/30/2022 10:48:39 AM By: Fredirick Maudlin MD FACS Signed: 06/30/2022 5:28:23 PM By: Baruch Gouty RN, BSN Entered By: Baruch Gouty on 06/30/2022 10:19:34 -------------------------------------------------------------------------------- HPI Details Patient Name: Date of Service: Jacob Katos R. 06/30/2022 10:30 A M Medical Record Number: [id number] Patient Account Number: [id number] Date of Birth/Sex: Treating RN: 10-Oct-1951 (71 y.o. [name] Primary Care Provider: Alain Honey Other Clinician: Referring Provider: Treating Provider/Extender: Neil Crouch in Treatment: 17 History of Present Illness Location: 2 ulcers noted to the left hallux Quality: the patient denies pain Duration: these were originally blisters towards the end of February and opened in early March Context: these wounds are secondary to pressure; he got new shoes in February and also started wearing a toe cover/sleeve Modifying Factors: pressure and friction are exacerbating factor HPI Description: 01/12/17- Jacob [name] arrives for initial evaluation of 2 ulcers to his left hallux. He states that back in February he did receive a new pair of diabetic shoes and at that time he started wearing toe cover/sleeves and noticed development of a blister to the dorsal and plantar aspect. He has been seen at [facility] by both Dr. Amalia Hailey and Dr. Jacqualyn Posey. He last saw Dr.Wagoner on 3/8 at which time he was prescribed Silvadene and Keflex. He completed that dose of Keflex and states that he got a phone  call to extend the Keflex prior to this appointment. He has a follow-up with Dr. Amalia Hailey on 3/26. He has been wearing a surgical shoe, no significant offloading to the plantar/medial ulcer but the strap does not extend over the dorsal ulcer. Topically he has been applying the Silvadene daily and a dry dressing. He is a diabetic,  on insulin, with a recent A1c of 6.2% (11/10/16). He is a current smoker, 0.5 PPD. He has no known diagnosis of arterial insufficiency. 01/20/17- The patient arrives for follow up evaluation of his ulcers to the left hallux. He admits to continuing to smoke approximately 0.5ppd. He states that his diet has been somewhat uncontrolled with higher than normal glucose levels, this seems circumstantial as he had a death in the family as food and schedule has been disrupted. he admits to wearing the surgical show with offloading modifications while out of the home, but has been ambulating barefoot around the home. he saw Dr. Amalia Hailey yesterday for toenail trimming and callus shaving to the right plantar foot and investigation of his ulcers. 01/26/17- he is here for follow-up evaluation of his left hallux ulcers. He states he has been significantly more compliant in the use of his offloading surgical shoe, with the exception of "getting up for a glass of water" he states that he has worn it with any ambulation. He states he has decreased his smoking too, admitting to only a cigarette since yesterday. He states his blood sugar this morning was 147 and yesterday was 116. He is voicing no complaints or concerns 02/02/17- He is here for follow-up evaluation of his left hallux ulcers. The dorsal ulcer is crusted over/healed. He states he has minimized his ambulation over this past week, much less than the previous week. He states his glucose levels have been between 120-150 about half of the time, otherwise greater than 150. He cannot correlate any specific dietary changes. He does have an  appointment with a dietitian next month. He was advised to keep a log of his glucose levels and his diet for that appointment. 02/06/17; patient comes in today for first application of the total contact cast. Her intake nurse reported the wounds look better therefore I did not actually see these before application of the cast. He'll return on Thursday for reapplication in the standard fashion 02/10/27- he is here for follow-up evaluation of his left plantar hallux ulcer. He came in Monday for application of a total contact cast. He states that he was tolerating that Monday, but noticed on Tuesday that his foot began to slide in the cast. He also is complaining of the heaviness of the cast, as he ambulates as his primary form of transportation. He continues to smoke,. He has not contacted his cardiologist regarding Nicorette gum. He states his blood sugars have been slightly elevated, this morning being 206. He states that he will complete an antibiotic tonight. He states this was prescribed by Dr. Jacqualyn Posey. He states that he has refilled this prescription twice, without direction to continue taking this medication. 02/16/17; patient wound about the same as last week per her description. Rolled edges of callus scan and nonviable subcutaneous tissue. Using Prisma. Apparently used topical antibiotic instead of the Prisma over the course of this week. He has not a total contact cast candidate secondary to ambulation needs and unsteadiness on his feet per discussion with our intake nurse 03/02/17 patient requires debridement. He has a small wound on the right great toe which probes however does not approach bone. What I can see of the base of this wound looks reasonably healthy and I think this is largely in offloading issue with the second toe crossing over the first. He did not tolerate a total contact cast has balance issues 5/171/8. 0.2x0.2x0.2 less depth. Using prisma 03/16/17; no major change. Still a  small probing wound  with copious amounts of surrounding callus skin nonviable subcutaneous tissue all of which debrided. Still using Prisma. I suspect the dimensions post debridement are about the same as listed above 0.2 x 0.2 x 0.2 03/23/17; no major change using Prisma. Once again a large amount of callus nonviable subcutaneous tissue around the wound. I went over the offloading issues with the patient and the nurse. He did not tolerate a total contact cast he uses public transportation among other issues. 03/30/17; no major change using Prisma. Once again a large amount of callus and nonviable tissue around the wound requires debridement. Saw Dr. Amalia Hailey who pared calluses on the other foot. 04/06/17; the wound is much smaller there is callus around this although I think I can see the base of the wound and I elected not to do any debridement. 04/13/17 this is a wound I did not debridement last week left callus intact. He arrives today with the area much in the same predicament however this time I remove the callus to find a deep probing hole. This is disappointing this is not go to bone. We have been using Prisma. 04/20/17 plantar left first toe. Again although a lot of circumferential callus around the wound which requires debridement however this does not appear to be a probing area. Culture I did last week show coag-negative staph. I thought this was probably not something that was worth treating but In the interim he was seen by his primary care with parotitis. He is now on clindamycin and Cipro and the Staphylococcus we cultured is Cipro sensitive [lugdunensis} 04/27/17; plantar left first toe. Again a lot of circumferential callus around a small open area. Removing the callus also nonviable subcutaneous tissue. The wound looks better today. He has completed his antibiotics for the parotitis. I didn't think additional antibiotics were necessary in terms of the wound 05/11/17; plantar first toe. I thought  this might be closed this week however once again he has a deep probing small hole. Using pickups and a scalpel debridement of thick callus and subcutaneous tissue from around the wound reveals a depth of about 0.4 cm. We have not been able to offload the area. There is no obvious infection here. No evidence of ischemia 05/18/17; since his last visit he's been in hospital for oral surgery apparently to remove a retained surgical screw. He has swelling of his jaw and has some discomfort. He arrives today with the plantar left first toe in the same predicament. No major change. Each time he comes in here he has thick adherent callus 05/25/17; arrives today with roughly the same condition of the plantar left great toe. Perhaps somewhat better less callus. There are no options for offloading. He is on his feet a lot and I think this is largely a pressure issue X-ray I did last week showed no evidence of osteomyelitis. He has a toe separator as the great toe tends to ride under the second toe when he is walking. I'm hopeful that this will allow for a little less pressure on the plantar aspect of the left great toe 06/07/2017 -- he is working hard on giving up smoking and other than that has been wearing his diabetic shoes and is also trying to offload as much as possible. 06/15/17; patient arrived in clinic today with a difficult area on his left medial great toe closed over. There is still amount of callus on this however I don't believe there is any underlying open wound. He has custom shoes  with inserts. I still think he is going to need to offload this area going forward. He also follows with Dr. Amalia Hailey of podiatry for routine foot care READMISSION 11/01/16; patient arrives in clinic today with a five-day history of a new area on the previously problematic left great toe. He is wearing his diabetic shoes and he apparently has a new pair coming. The patient is a type II diabetic with neuropathy. He follows  with Dr. Amalia Hailey of podiatry for pre-ulcerative callus on his bilateral feet last seen on 08/24/17. He has developed a large blister on top of his left great toe this is since gone down somewhat. He is not been dressing this specifically. He does not have a history of significant PAD previous ABIs in this clinic were1.1 11/06/17; left great toe has epithelialized since last time. I think this was caused by a insoles an issue pushing the toe against the dorsal aspect of the shoe itself has new diabetic shoes coming hopefully will have more forefoot depth. READMISSION 03/01/2022 This is a now 71 year old man with a past medical history significant for type 2 diabetes mellitus with end-stage renal disease on hemodialysis as as well as congestive heart failure, ischemic cardiomyopathy, hypertension, and prior diabetic foot ulcers. He has had multiple issues with his hemodialysis access. He had a prior left radiocephalic fistula that failed. In October 2020, he had a left brachiobasilic fistula. He apparently underwent a procedure at an outside vascular office on November 09, 2021. Subsequent to that, he developed a wound on his left posterior forearm just distal to the elbow as well as wounds on 2 of his left digits. He was diagnosed with steal syndrome and recently underwent ligation of his fistula with placement of a hemodialysis catheter. He is here today for assistance in healing the wounds that developed related to steal syndrome. He says that he has been applying Neosporin to the sites and they are fairly sensitive to the touch. 03/08/2022: The wounds on his fingers are much smaller and have just a little bit of accumulated thin eschar. The wound on his left posterior forearm is also smaller but has accumulated both slough and eschar. He also has a thick callus on his right fifth metatarsal head plantar surface and unfortunately, it appears that there is an open wound underneath the callus. We have been  using Iodosorb with Hydrofera Blue to all of his open wounds. Pain is much improved from last week. 03/15/2022: The wounds on his fingers have healed. The wound on his left posterior forearm is smaller and has a nice layer of granulation tissue with just a bit of accumulated slough. He has built up additional callus around the right fifth metatarsal head plantar surface, but the underlying wound has a good surface with granulation tissue present. We have been using Iodosorb with Hydrofera Blue. He did not endorse any pain this week. 03/22/2022: The wound on his left posterior forearm continues to contract. There is good granulation tissue present and just a little bit of periwound eschar and overlying slough. The right fifth metatarsal head wound has once again accumulated some callus and there is slough within the wound. The intake nurse measured a area of undermining from 7-10 o'clock. 03/29/2022: The patient saw podiatry this week for nail trim and they also pared back a number of calluses. The wound on his left posterior forearm is substantially smaller with just some overlying thin eschar. The right fifth metatarsal head has a bit of callus accumulation as  well as some slough in the wound bed but is otherwise clean. It is smaller today, as well. 04/05/2022: The wound on his left posterior forearm is nearly closed with just a small amount of overlying eschar. The right fifth metatarsal head wound is smaller again this week. There is some periwound callus accumulation and some slough in the wound bed. He is going to get new dialysis access placed next week. 04/19/2022: The wound on his left posterior forearm is closed. The right fifth metatarsal head wound is about the same size with periwound callus and slough accumulation. He did have a new right forearm PTFE AV graft placed last week. His right forearm and hand are quite swollen, warm, and red today. He was apparently seen in the emergency room  yesterday and they did not think it was infected and attributed the swelling to normal postop findings. 04/28/2022: The wound on his foot has accumulated a lot of periwound callus, but the actual open surface is quite small. There is just a little bit of slough on the surface. 05/03/2022: His wound is even smaller today with less periwound callus accumulation. He continues to build up slough but the underlying tissue is healthy and viable-appearing. 05/10/2022: The orifice of the wound continues to contract, but there is some undermining and periwound callus formation. No significant accumulation of slough. 05/17/2022: The wound is about the same size today, perhaps slightly smaller. He does have periwound callus accumulation, but it does not overlap or create any undermining. There is some slough in the base of the wound. 05/31/2022: The wound measured larger today on intake, but this may be secondary to the fairly aggressive debridement I performed at his last visit. Despite this, he has built up callus around the wound again. There is some epiboly around the orifice. There is slough within the wound and some undermining is present. His blood sugar remains very poorly controlled; it was 300 today. 06/16/2022: No real change in the size of his wound. He has callus accumulation around the perimeter. Last visit we changed his dressing to Prisma; I have not seen much in the way of filling in from the base yet. 06/30/2022: No real change to his wound. There is some undermining that has been created by accumulated callus. We are struggling with poor glucose control in the setting of immunodeficiency and end-stage renal disease on dialysis. Electronic Signature(s) Signed: 06/30/2022 10:26:30 AM By: Fredirick Maudlin MD FACS Entered By: Fredirick Maudlin on 06/30/2022 10:26:30 -------------------------------------------------------------------------------- Physical Exam Details Patient Name: Date of  Service: Jacob Ache. 06/30/2022 10:30 A M Medical Record Number: [id number] Patient Account Number: [id number] Date of Birth/Sex: Treating RN: 29-Apr-1951 (71 y.o. [name] Primary Care Provider: Alain Honey Other Clinician: Referring Provider: Treating Provider/Extender: Neil Crouch in Treatment: 17 Constitutional . . . . No acute distress.[name] Respiratory Normal work of breathing on room air.. Notes 06/30/2022: No real change to his wound. There is some undermining that has been created by accumulated callus. Electronic Signature(s) Signed: 06/30/2022 10:28:41 AM By: Fredirick Maudlin MD FACS Entered By: Fredirick Maudlin on 06/30/2022 10:28:40 -------------------------------------------------------------------------------- Physician Orders Details Patient Name: Date of Service: Jacob Ache. 06/30/2022 10:30 A M Medical Record Number: [id number] Patient Account Number: [id number] Date of Birth/Sex: Treating RN: March 29, 1951 (71 y.o. [name] Primary Care Provider: Alain Honey Other Clinician: Referring Provider: Treating Provider/Extender: Neil Crouch in Treatment: [phone] Verbal / Phone Orders: No Diagnosis  Coding ICD-10 Coding Code Description L97.512 Non-pressure chronic ulcer of other part of right foot with fat layer exposed E11.22 Type 2 diabetes mellitus with diabetic chronic kidney disease J44.9 Chronic obstructive pulmonary disease, unspecified I50.42 Chronic combined systolic (congestive) and diastolic (congestive) heart failure I10 Essential (primary) hypertension N25.81 Secondary hyperparathyroidism of renal origin N18.6 End stage renal disease B20 Human immunodeficiency virus [HIV] disease Follow-up Appointments ppointment in 2 weeks. - Dr. Celine Ahr RM 1 with [name] Return A Anesthetic Wound #8 Right,Plantar Foot (In clinic) Topical Lidocaine 4% applied to wound bed Bathing/ Shower/  Hygiene Other Bathing/Shower/Hygiene Orders/Instructions: - Change dressing after bathing Off-Loading Other: - wear custom diabetic shoes at al times when walking Wound Treatment Wound #8 - Foot Wound Laterality: Plantar, Right Cleanser: Soap and Water Every Other Day/15 Days Discharge Instructions: May shower and wash wound with dial antibacterial soap and water prior to dressing change. Cleanser: Wound Cleanser (Generic) Every Other Day/15 Days Discharge Instructions: Cleanse the wound with wound cleanser prior to applying a clean dressing using gauze sponges, not tissue or cotton balls. Prim Dressing: Hydrofera Blue Ready Foam, 2.5 x2.5 in Every Other Day/15 Days ary Discharge Instructions: Apply to wound bed as instructed Secondary Dressing: Optifoam Non-Adhesive Dressing, 4x4 in Every Other Day/15 Days Discharge Instructions: Apply over primary dressing cut to make foam donut Secondary Dressing: Woven Gauze Sponges 2x2 in Every Other Day/15 Days Discharge Instructions: Apply over primary dressing as directed. Secured With: 80M Medipore H Soft Cloth Surgical T ape, 4 x 10 (in/yd) Every Other Day/15 Days Discharge Instructions: Secure with tape as directed. Patient Medications llergies: Amphetamine Analogues A Notifications Medication Indication Start End prior to debridement 06/30/2022 lidocaine DOSE topical 4 % cream - cream topical Electronic Signature(s) Signed: 06/30/2022 10:48:39 AM By: Fredirick Maudlin MD FACS Entered By: Fredirick Maudlin on 06/30/2022 10:28:52 -------------------------------------------------------------------------------- Problem List Details Patient Name: Date of Service: Jacob Katos R. 06/30/2022 10:30 A M Medical Record Number: [id number] Patient Account Number: [id number] Date of Birth/Sex: Treating RN: 1950-11-26 (71 y.o. [name] Primary Care Provider: Alain Honey Other Clinician: Referring Provider: Treating Provider/Extender:  Neil Crouch in Treatment: 17 Active Problems ICD-10 Encounter Code Description Active Date MDM Diagnosis L97.512 Non-pressure chronic ulcer of other part of right foot with fat layer exposed 03/08/2022 No Yes E11.22 Type 2 diabetes mellitus with diabetic chronic kidney disease 03/01/2022 No Yes J44.9 Chronic obstructive pulmonary disease, unspecified 03/01/2022 No Yes I50.42 Chronic combined systolic (congestive) and diastolic (congestive) heart failure 03/01/2022 No Yes I10 Essential (primary) hypertension 03/01/2022 No Yes N25.81 Secondary hyperparathyroidism of renal origin 03/01/2022 No Yes N18.6 End stage renal disease 03/01/2022 No Yes B20 Human immunodeficiency virus [HIV] disease 03/01/2022 No Yes Inactive Problems Resolved Problems ICD-10 Code Description Active Date Resolved Date L98.499 Non-pressure chronic ulcer of skin of other sites with unspecified severity 03/01/2022 03/01/2022 Electronic Signature(s) Signed: 06/30/2022 10:25:07 AM By: Fredirick Maudlin MD FACS Entered By: Fredirick Maudlin on 06/30/2022 10:25:07 -------------------------------------------------------------------------------- Progress Note Details Patient Name: Date of Service: Jacob Katos R. 06/30/2022 10:30 A M Medical Record Number: [id number] Patient Account Number: [id number] Date of Birth/Sex: Treating RN: December 13, 1950 (71 y.o. [name] Primary Care Provider: Alain Honey Other Clinician: Referring Provider: Treating Provider/Extender: Neil Crouch in Treatment: 17 Subjective Chief Complaint Information obtained from Patient Patient seen for complaints of Non-Healing Wounds x 3 History of Present Illness (HPI) The following HPI elements were documented for the patient's wound: Location: 2  ulcers noted to the left hallux Quality: the patient denies pain Duration: these were originally blisters towards the end of February and opened in early  March Context: these wounds are secondary to pressure; he got new shoes in February and also started wearing a toe cover/sleeve Modifying Factors: pressure and friction are exacerbating factor 01/12/17- Jacob [name] arrives for initial evaluation of 2 ulcers to his left hallux. He states that back in February he did receive a new pair of diabetic shoes and at that time he started wearing toe cover/sleeves and noticed development of a blister to the dorsal and plantar aspect. He has been seen at [facility] by both Dr. Amalia Hailey and Dr. Jacqualyn Posey. He last saw Dr.Wagoner on 3/8 at which time he was prescribed Silvadene and Keflex. He completed that dose of Keflex and states that he got a phone call to extend the Keflex prior to this appointment. He has a follow-up with Dr. Amalia Hailey on 3/26. He has been wearing a surgical shoe, no significant offloading to the plantar/medial ulcer but the strap does not extend over the dorsal ulcer. T opically he has been applying the Silvadene daily and a dry dressing. He is a diabetic, on insulin, with a recent A1c of 6.2% (11/10/16). He is a current smoker, 0.5 PPD. He has no known diagnosis of arterial insufficiency. 01/20/17- The patient arrives for follow up evaluation of his ulcers to the left hallux. He admits to continuing to smoke approximately 0.5ppd. He states that his diet has been somewhat uncontrolled with higher than normal glucose levels, this seems circumstantial as he had a death in the family as food and schedule has been disrupted. he admits to wearing the surgical show with offloading modifications while out of the home, but has been ambulating barefoot around the home. he saw Dr. Amalia Hailey yesterday for toenail trimming and callus shaving to the right plantar foot and investigation of his ulcers. 01/26/17- he is here for follow-up evaluation of his left hallux ulcers. He states he has been significantly more compliant in the use of his offloading surgical shoe,  with the exception of "getting up for a glass of water" he states that he has worn it with any ambulation. He states he has decreased his smoking too, admitting to only a cigarette since yesterday. He states his blood sugar this morning was 147 and yesterday was 116. He is voicing no complaints or concerns 02/02/17- He is here for follow-up evaluation of his left hallux ulcers. The dorsal ulcer is crusted over/healed. He states he has minimized his ambulation over this past week, much less than the previous week. He states his glucose levels have been between 120-150 about half of the time, otherwise greater than 150. He cannot correlate any specific dietary changes. He does have an appointment with a dietitian next month. He was advised to keep a log of his glucose levels and his diet for that appointment. 02/06/17; patient comes in today for first application of the total contact cast. Her intake nurse reported the wounds look better therefore I did not actually see these before application of the cast. He'll return on Thursday for reapplication in the standard fashion 02/10/27- he is here for follow-up evaluation of his left plantar hallux ulcer. He came in Monday for application of a total contact cast. He states that he was tolerating that Monday, but noticed on Tuesday that his foot began to slide in the cast. He also is complaining of the heaviness of the  cast, as he ambulates as his primary form of transportation. He continues to smoke,. He has not contacted his cardiologist regarding Nicorette gum. He states his blood sugars have been slightly elevated, this morning being 206. He states that he will complete an antibiotic tonight. He states this was prescribed by Dr. Jacqualyn Posey. He states that he has refilled this prescription twice, without direction to continue taking this medication. 02/16/17; patient wound about the same as last week per her description. Rolled edges of callus scan and nonviable  subcutaneous tissue. Using Prisma. Apparently used topical antibiotic instead of the Prisma over the course of this week. He has not a total contact cast candidate secondary to ambulation needs and unsteadiness on his feet per discussion with our intake nurse 03/02/17 patient requires debridement. He has a small wound on the right great toe which probes however does not approach bone. What I can see of the base of this wound looks reasonably healthy and I think this is largely in offloading issue with the second toe crossing over the first. He did not tolerate a total contact cast has balance issues 5/171/8. 0.2x0.2x0.2 less depth. Using prisma 03/16/17; no major change. Still a small probing wound with copious amounts of surrounding callus skin nonviable subcutaneous tissue all of which debrided. Still using Prisma. I suspect the dimensions post debridement are about the same as listed above 0.2 x 0.2 x 0.2 03/23/17; no major change using Prisma. Once again a large amount of callus nonviable subcutaneous tissue around the wound. I went over the offloading issues with the patient and the nurse. He did not tolerate a total contact cast he uses public transportation among other issues. 03/30/17; no major change using Prisma. Once again a large amount of callus and nonviable tissue around the wound requires debridement. Saw Dr. Amalia Hailey who pared calluses on the other foot. 04/06/17; the wound is much smaller there is callus around this although I think I can see the base of the wound and I elected not to do any debridement. 04/13/17 this is a wound I did not debridement last week left callus intact. He arrives today with the area much in the same predicament however this time I remove the callus to find a deep probing hole. This is disappointing this is not go to bone. We have been using Prisma. 04/20/17 plantar left first toe. Again although a lot of circumferential callus around the wound which requires  debridement however this does not appear to be a probing area. Culture I did last week show coag-negative staph. I thought this was probably not something that was worth treating but In the interim he was seen by his primary care with parotitis. He is now on clindamycin and Cipro and the Staphylococcus we cultured is Cipro sensitive [lugdunensis} 04/27/17; plantar left first toe. Again a lot of circumferential callus around a small open area. Removing the callus also nonviable subcutaneous tissue. The wound looks better today. He has completed his antibiotics for the parotitis. I didn't think additional antibiotics were necessary in terms of the wound 05/11/17; plantar first toe. I thought this might be closed this week however once again he has a deep probing small hole. Using pickups and a scalpel debridement of thick callus and subcutaneous tissue from around the wound reveals a depth of about 0.4 cm. We have not been able to offload the area. There is no obvious infection here. No evidence of ischemia 05/18/17; since his last visit he's been in hospital for  oral surgery apparently to remove a retained surgical screw. He has swelling of his jaw and has some discomfort. He arrives today with the plantar left first toe in the same predicament. No major change. Each time he comes in here he has thick adherent callus 05/25/17; arrives today with roughly the same condition of the plantar left great toe. Perhaps somewhat better less callus. There are no options for offloading. He is on his feet a lot and I think this is largely a pressure issue X-ray I did last week showed no evidence of osteomyelitis. He has a toe separator as the great toe tends to ride under the second toe when he is walking. I'm hopeful that this will allow for a little less pressure on the plantar aspect of the left great toe 06/07/2017 -- he is working hard on giving up smoking and other than that has been wearing his diabetic shoes and  is also trying to offload as much as possible. 06/15/17; patient arrived in clinic today with a difficult area on his left medial great toe closed over. There is still amount of callus on this however I don't believe there is any underlying open wound. He has custom shoes with inserts. I still think he is going to need to offload this area going forward. He also follows with Dr. Amalia Hailey of podiatry for routine foot care READMISSION 11/01/16; patient arrives in clinic today with a five-day history of a new area on the previously problematic left great toe. He is wearing his diabetic shoes and he apparently has a new pair coming. The patient is a type II diabetic with neuropathy. He follows with Dr. Amalia Hailey of podiatry for pre-ulcerative callus on his bilateral feet last seen on 08/24/17. He has developed a large blister on top of his left great toe this is since gone down somewhat. He is not been dressing this specifically. He does not have a history of significant PAD previous ABIs in this clinic were1.1 11/06/17; left great toe has epithelialized since last time. I think this was caused by a insoles an issue pushing the toe against the dorsal aspect of the shoe itself has new diabetic shoes coming hopefully will have more forefoot depth. READMISSION 03/01/2022 This is a now 70 year old man with a past medical history significant for type 2 diabetes mellitus with end-stage renal disease on hemodialysis as as well as congestive heart failure, ischemic cardiomyopathy, hypertension, and prior diabetic foot ulcers. He has had multiple issues with his hemodialysis access. He had a prior left radiocephalic fistula that failed. In October 2020, he had a left brachiobasilic fistula. He apparently underwent a procedure at an outside vascular office on November 09, 2021. Subsequent to that, he developed a wound on his left posterior forearm just distal to the elbow as well as wounds on 2 of his left digits. He was  diagnosed with steal syndrome and recently underwent ligation of his fistula with placement of a hemodialysis catheter. He is here today for assistance in healing the wounds that developed related to steal syndrome. He says that he has been applying Neosporin to the sites and they are fairly sensitive to the touch. 03/08/2022: The wounds on his fingers are much smaller and have just a little bit of accumulated thin eschar. The wound on his left posterior forearm is also smaller but has accumulated both slough and eschar. He also has a thick callus on his right fifth metatarsal head plantar surface and unfortunately, it appears that there  is an open wound underneath the callus. We have been using Iodosorb with Hydrofera Blue to all of his open wounds. Pain is much improved from last week. 03/15/2022: The wounds on his fingers have healed. The wound on his left posterior forearm is smaller and has a nice layer of granulation tissue with just a bit of accumulated slough. He has built up additional callus around the right fifth metatarsal head plantar surface, but the underlying wound has a good surface with granulation tissue present. We have been using Iodosorb with Hydrofera Blue. He did not endorse any pain this week. 03/22/2022: The wound on his left posterior forearm continues to contract. There is good granulation tissue present and just a little bit of periwound eschar and overlying slough. The right fifth metatarsal head wound has once again accumulated some callus and there is slough within the wound. The intake nurse measured a area of undermining from 7-10 o'clock. 03/29/2022: The patient saw podiatry this week for nail trim and they also pared back a number of calluses. The wound on his left posterior forearm is substantially smaller with just some overlying thin eschar. The right fifth metatarsal head has a bit of callus accumulation as well as some slough in the wound bed but is otherwise clean.  It is smaller today, as well. 04/05/2022: The wound on his left posterior forearm is nearly closed with just a small amount of overlying eschar. The right fifth metatarsal head wound is smaller again this week. There is some periwound callus accumulation and some slough in the wound bed. He is going to get new dialysis access placed next week. 04/19/2022: The wound on his left posterior forearm is closed. The right fifth metatarsal head wound is about the same size with periwound callus and slough accumulation. He did have a new right forearm PTFE AV graft placed last week. His right forearm and hand are quite swollen, warm, and red today. He was apparently seen in the emergency room yesterday and they did not think it was infected and attributed the swelling to normal postop findings. 04/28/2022: The wound on his foot has accumulated a lot of periwound callus, but the actual open surface is quite small. There is just a little bit of slough on the surface. 05/03/2022: His wound is even smaller today with less periwound callus accumulation. He continues to build up slough but the underlying tissue is healthy and viable-appearing. 05/10/2022: The orifice of the wound continues to contract, but there is some undermining and periwound callus formation. No significant accumulation of slough. 05/17/2022: The wound is about the same size today, perhaps slightly smaller. He does have periwound callus accumulation, but it does not overlap or create any undermining. There is some slough in the base of the wound. 05/31/2022: The wound measured larger today on intake, but this may be secondary to the fairly aggressive debridement I performed at his last visit. Despite this, he has built up callus around the wound again. There is some epiboly around the orifice. There is slough within the wound and some undermining is present. His blood sugar remains very poorly controlled; it was 300 today. 06/16/2022: No real change in  the size of his wound. He has callus accumulation around the perimeter. Last visit we changed his dressing to Prisma; I have not seen much in the way of filling in from the base yet. 06/30/2022: No real change to his wound. There is some undermining that has been created by accumulated callus. We are  struggling with poor glucose control in the setting of immunodeficiency and end-stage renal disease on dialysis. Patient History Information obtained from Patient. Family History Diabetes - Siblings,Father, Heart Disease - Father,Siblings, Hypertension - Father,Siblings, No family history of Cancer, Hereditary Spherocytosis, Kidney Disease, Lung Disease, Seizures, Stroke, Thyroid Problems, Tuberculosis. Social History Current every day smoker - 1/2 ppd / 81yr, Marital Status - Single, Alcohol Use - Moderate - 6 standard drinks per week, Drug Use - Prior History - Polysubstance Abuse; clean for 9 years, Caffeine Use - Rarely - tea. Medical History Eyes Patient has history of Cataracts - mild, Glaucoma Denies history of Optic Neuritis Ear/Nose/Mouth/Throat Denies history of Chronic sinus problems/congestion, Middle ear problems Hematologic/Lymphatic Patient has history of Anemia Denies history of Hemophilia, Human Immunodeficiency Virus, Lymphedema, Sickle Cell Disease Respiratory Patient has history of Chronic Obstructive Pulmonary Disease (COPD) Denies history of Aspiration, Asthma, Pneumothorax, Sleep Apnea, Tuberculosis Cardiovascular Patient has history of Congestive Heart Failure, Coronary Artery Disease, Hypertension, Myocardial Infarction - 2004 Denies history of Angina, Arrhythmia, Deep Vein Thrombosis, Hypotension, Peripheral Arterial Disease, Peripheral Venous Disease, Phlebitis, Vasculitis Gastrointestinal Patient has history of Hepatitis C - 1968 Denies history of Cirrhosis , Colitis, Crohnoos, Hepatitis A, Hepatitis B Endocrine Patient has history of Type II Diabetes Denies  history of Type I Diabetes Genitourinary Denies history of End Stage Renal Disease Immunological Denies history of Lupus Erythematosus, Raynaudoos, Scleroderma Integumentary (Skin) Denies history of History of Burn Musculoskeletal Patient has history of Gout, Osteoarthritis Denies history of Rheumatoid Arthritis, Osteomyelitis Neurologic Patient has history of Neuropathy Denies history of Dementia, Quadriplegia, Paraplegia, Seizure Disorder Oncologic Denies history of Received Chemotherapy, Received Radiation Psychiatric Denies history of Anorexia/bulimia, Confinement Anxiety Hospitalization/Surgery History - Colonoscopy. - CABG. - Laproscopic Cholecystectomy. - Left Hip Fx. - Left Jaw Fx. - Lumbar Laminectomy. Medical A Surgical History Notes nd Constitutional Symptoms (General Health) Insomnia Syncope Alcohol Dependence HIV Hematologic/Lymphatic Hyperlipidemia Hyperglycemia Cardiovascular CABG, ischemic cardiomyopathy Gastrointestinal GERD Genitourinary Syphilis CKD,Nocturia Immunological HIV Musculoskeletal Hip Fx BiLateral Bunions Unspecified Arthritis Neurologic chronic back pain Objective Constitutional No acute distress.. Vitals Time Taken: 9:49 AM, Height: 74 in, Weight: 172 lbs, BMI: 22.1, Temperature: 97.7 F, Pulse: 70 bpm, Respiratory Rate: 18 breaths/min, Blood Pressure: 119/61 mmHg, Capillary Blood Glucose: 200 mg/dl. General Notes: glucose per pt report this am Respiratory Normal work of breathing on room air.. General Notes: 06/30/2022: No real change to his wound. There is some undermining that has been created by accumulated callus. Integumentary (Hair, Skin) Wound #8 status is Open. Original cause of wound was Blister. The date acquired was: 03/08/2022. The wound has been in treatment 16 weeks. The wound is located [place] the [place] The wound measures 0.9cm length x 0.8cm width x 1.2cm depth; 0.565cm^2 area and 0.679cm^3 volume. There is Fat  Layer (Subcutaneous Tissue) exposed. There is no tunneling noted, however, there is undermining starting at 7:00 and ending at 1:00 with a maximum distance of 0.8cm. There is a medium amount of serosanguineous drainage noted. The wound margin is thickened. There is large (67-100%) red granulation within the wound bed. There is a small (1-33%) amount of necrotic tissue within the wound bed including Adherent Slough. 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 Chronic obstructive pulmonary disease, unspecified Chronic combined systolic (congestive) and diastolic (congestive) heart failure Essential (primary) hypertension Secondary hyperparathyroidism of renal origin End stage renal disease Human immunodeficiency virus [HIV] disease  Procedures Wound #8 Pre-procedure diagnosis of Wound #8 is a Diabetic Wound/Ulcer of the Lower Extremity located on the Right,Plantar Foot .Severity of Tissue Pre Debridement is: Fat layer exposed. There was a Excisional Skin/Subcutaneous Tissue Debridement with a total area of 2.25 sq cm performed by Fredirick Maudlin, MD. With the following instrument(s): Curette to remove Viable and Non-Viable tissue/material. Material removed includes Callus, Subcutaneous Tissue, and Skin: Epidermis after achieving pain control using Lidocaine 4% Topical Solution. No specimens were taken. A time out was conducted at 10:05, prior to the start of the procedure. A Minimum amount of bleeding was controlled with Pressure. The procedure was tolerated well with a pain level of 0 throughout and a pain level of 0 following the procedure. Post Debridement Measurements: 1cm length x 1.2cm width x 1.2cm depth; 1.131cm^3 volume. Character of Wound/Ulcer Post Debridement is improved. Severity of Tissue Post Debridement is: Fat layer exposed. Post procedure Diagnosis  Wound #8: Same as Pre-Procedure Plan Follow-up Appointments: Return Appointment in 2 weeks. - Dr. Celine Ahr RM 1 with [name] Anesthetic: Wound #8 Right,Plantar Foot: (In clinic) Topical Lidocaine 4% applied to wound bed Bathing/ Shower/ Hygiene: Other Bathing/Shower/Hygiene Orders/Instructions: - Change dressing after bathing Off-Loading: Other: - wear custom diabetic shoes at al times when walking The following medication(s) was prescribed: lidocaine topical 4 % cream cream topical for prior to debridement was prescribed at facility WOUND #8: - Foot Wound Laterality: Plantar, Right Cleanser: Soap and Water Every Other Day/15 Days Discharge Instructions: May shower and wash wound with dial antibacterial soap and water prior to dressing change. Cleanser: Wound Cleanser (Generic) Every Other Day/15 Days Discharge Instructions: Cleanse the wound with wound cleanser prior to applying a clean dressing using gauze sponges, not tissue or cotton balls. Prim Dressing: Hydrofera Blue Ready Foam, 2.5 x2.5 in Every Other Day/15 Days ary Discharge Instructions: Apply to wound bed as instructed Secondary Dressing: Optifoam Non-Adhesive Dressing, 4x4 in Every Other Day/15 Days Discharge Instructions: Apply over primary dressing cut to make foam donut Secondary Dressing: Woven Gauze Sponges 2x2 in Every Other Day/15 Days Discharge Instructions: Apply over primary dressing as directed. Secured With: 40M Medipore H Soft Cloth Surgical T ape, 4 x 10 (in/yd) Every Other Day/15 Days Discharge Instructions: Secure with tape as directed. 06/30/2022: No real change to his wound. There is some undermining that has been created by accumulated callus. I used a curette and very aggressively debrided callus, skin, and subcutaneous tissue from the wound in an effort to try and stimulate the healing cascade. I am going to change his dressing to [name] from the silver collagen, as I have seen absolutely no change with  the latter dressing. He did have 1/5 metatarsal head fracture on x-ray done in January but there was no evidence of osteomyelitis at that time. We brought up the possibility of repeating imaging, but the patient says he has an appointment with podiatry coming up and he will ask them for an x-ray at that visit. He will follow-up here in 2 weeks. Electronic Signature(s) Signed: 06/30/2022 10:30:03 AM By: Fredirick Maudlin MD FACS Entered By: Fredirick Maudlin on 06/30/2022 10:30:03 -------------------------------------------------------------------------------- HxROS Details Patient Name: Date of Service: Jacob Katos R. 06/30/2022 10:30 A M Medical Record Number: [id number] Patient Account Number: [id number] Date of Birth/Sex: Treating RN: [birth year] (71 y.o. [name] Primary Care Provider: Alain Honey Other Clinician: Referring Provider: Treating Provider/Extender: Neil Crouch in Treatment: 17 Information Obtained From Patient Constitutional Symptoms (General Health)  Medical History: Past Medical History Notes: Insomnia Syncope Alcohol Dependence HIV Eyes Medical History: Positive for: Cataracts - mild; Glaucoma Negative for: Optic Neuritis Ear/Nose/Mouth/Throat Medical History: Negative for: Chronic sinus problems/congestion; Middle ear problems Hematologic/Lymphatic Medical History: Positive for: Anemia Negative for: Hemophilia; Human Immunodeficiency Virus; Lymphedema; Sickle Cell Disease Past Medical History Notes: Hyperlipidemia Hyperglycemia Respiratory Medical History: Positive for: Chronic Obstructive Pulmonary Disease (COPD) Negative for: Aspiration; Asthma; Pneumothorax; Sleep Apnea; Tuberculosis Cardiovascular Medical History: Positive for: Congestive Heart Failure; Coronary Artery Disease; Hypertension; Myocardial Infarction - 2004 Negative for: Angina; Arrhythmia; Deep Vein Thrombosis; Hypotension; Peripheral  Arterial Disease; Peripheral Venous Disease; Phlebitis; Vasculitis Past Medical History Notes: CABG, ischemic cardiomyopathy Gastrointestinal Medical History: Positive for: Hepatitis C - 1968 Negative for: Cirrhosis ; Colitis; Crohns; Hepatitis A; Hepatitis B Past Medical History Notes: GERD Endocrine Medical History: Positive for: Type II Diabetes Negative for: Type I Diabetes Time with diabetes: 13 yrs Treated with: Insulin Blood sugar tested every day: Yes Tested : 2 times per day Blood sugar testing results: Breakfast: 100; Dinner: 95-126 Genitourinary Medical History: Negative for: End Stage Renal Disease Past Medical History Notes: Syphilis CKD,Nocturia Immunological Medical History: Negative for: Lupus Erythematosus; Raynauds; Scleroderma Past Medical History Notes: HIV Integumentary (Skin) Medical History: Negative for: History of Burn Musculoskeletal Medical History: Positive for: Gout; Osteoarthritis Negative for: Rheumatoid Arthritis; Osteomyelitis Past Medical History Notes: Hip Fx BiLateral Bunions Unspecified Arthritis Neurologic Medical History: Positive for: Neuropathy Negative for: Dementia; Quadriplegia; Paraplegia; Seizure Disorder Past Medical History Notes: chronic back pain Oncologic Medical History: Negative for: Received Chemotherapy; Received Radiation Psychiatric Medical History: Negative for: Anorexia/bulimia; Confinement Anxiety HBO Extended History Items Eyes: Eyes: Cataracts Glaucoma Immunizations Pneumococcal Vaccine: Received Pneumococcal Vaccination: Yes Received Pneumococcal Vaccination On or After 60th Birthday: Yes Implantable Devices No devices added Hospitalization / Surgery History Type of Hospitalization/Surgery Colonoscopy CABG Laproscopic Cholecystectomy Left Hip Fx Left Jaw Fx Lumbar Laminectomy Family and Social History Cancer: No; Diabetes: Yes - Siblings,Father; Heart Disease: Yes - Father,Siblings;  Hereditary Spherocytosis: No; Hypertension: Yes - Father,Siblings; Kidney Disease: No; Lung Disease: No; Seizures: No; Stroke: No; Thyroid Problems: No; Tuberculosis: No; Current every day smoker - 1/2 ppd / 53yr; Marital Status - Single; Alcohol Use: Moderate - 6 standard drinks per week; Drug Use: Prior History - Polysubstance Abuse; clean for 9 years; Caffeine Use: Rarely - tea; Financial Concerns: No; Food, Clothing or Shelter Needs: No; Support System Lacking: No; Transportation Concerns: No Electronic Signature(s) Signed: 06/30/2022 10:48:39 AM By: CFredirick MaudlinMD FACS Signed: 06/30/2022 5:28:23 PM By: BBaruch GoutyRN, BSN Entered By: CFredirick Maudlinon 06/30/2022 10:28:19 -------------------------------------------------------------------------------- SuperBill Details Patient Name: Date of Service: NRaechel Ache9/04/2022 Medical Record Number: [id number] Account Number: [id number] of Birth/Sex: Treating RN: 413-Oct-1952(72y.o. 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 J44.9 Chronic obstructive pulmonary disease, unspecified I50.42 Chronic combined systolic (congestive) and diastolic (congestive) heart failure I10 Essential (primary) hypertension N25.81 Secondary hyperparathyroidism of renal origin N18.6 End stage renal disease B20 Human immunodeficiency virus [HIV] disease Facility Procedures CPT4 Code: [id number]: 11042 - DEB SUBQ TISSUE 20 SQ CM/< ICD-10 Diagnosis Description L97.512 Non-pressure chronic ulcer of other part of right foot with fat layer exposed Modifier: Quantity: 1 Physician  Procedures : CPT4 Code Description Modifier [id number] 42706 - WC PHYS LEVEL 3 - EST PT 25 ICD-10 Diagnosis Description L97.512 Non-pressure chronic ulcer of other part of right foot with fat layer exposed E11.22 Type 2 diabetes mellitus with diabetic chronic kidney  disease N18.6 End stage renal disease B20 Human immunodeficiency virus [HIV] disease Quantity: 1 : [id number] 11042 - WC PHYS SUBQ TISS 20 SQ CM ICD-10 Diagnosis Description L97.512 Non-pressure chronic ulcer of other part of right foot with fat layer exposed Quantity: 1 Electronic Signature(s) Signed: 06/30/2022 10:30:24 AM By: Fredirick Maudlin MD FACS Entered By: Fredirick Maudlin on 06/30/2022 10:30:24

## 2022-06-30 NOTE — Progress Notes (Signed)
EDY, BELT ([id number]) Visit Report for 06/30/2022 Arrival Information Details Patient Name: Date of Service: Jacob [name], Jacob [name] 06/30/2022 10:30 A M Medical Record Number: [id number] Patient Account Number: [id number] Date of Birth/Sex: Treating RN: 01-Sep-1951 (71 y.o. [name] Primary Care Aldon Hengst: Alain Honey Other Clinician: Referring Duha Abair: Treating Mercedies Ganesh/Extender: Neil Crouch in Treatment: 87 Visit Information History Since Last Visit Added or deleted any medications: No Patient Arrived: [name] Any new allergies or adverse reactions: No Arrival Time: 09:46 Had a fall or experienced change in No Accompanied By: self activities of daily living that may affect Transfer Assistance: None risk of falls: Patient Identification Verified: Yes Signs or symptoms of abuse/neglect since last visito No Secondary Verification Process Completed: Yes Hospitalized since last visit: No Patient Requires Transmission-Based Precautions: No Implantable device outside of the clinic excluding No Patient Has Alerts: No cellular tissue based products placed in the center since last visit: Has Dressing in Place as Prescribed: Yes Pain Present Now: Yes Electronic Signature(s) Signed: 06/30/2022 5:28:23 PM By: Baruch Gouty RN, BSN Entered By: Baruch Gouty on 06/30/2022 09:47:46 -------------------------------------------------------------------------------- Encounter Discharge Information Details Patient Name: Date of Service: Jacob Ache. 06/30/2022 10:30 A M Medical Record Number: [id number] Patient Account Number: [id number] Date of Birth/Sex: Treating RN: July 01, 1951 (71 y.o. [name] Primary Care Bona Hubbard: Alain Honey Other Clinician: Referring Diego Ulbricht: Treating Irena Gaydos/Extender: Neil Crouch in Treatment: 17 Encounter Discharge Information Items Post Procedure Vitals Discharge Condition:  Stable Temperature (F): 97.7 Ambulatory Status: Cane Pulse (bpm): 70 Discharge Destination: Home Respiratory Rate (breaths/min): 18 Transportation: Private Auto Blood Pressure (mmHg): 119/61 Accompanied By: self Schedule Follow-up Appointment: Yes Clinical Summary of Care: Patient Declined Electronic Signature(s) Signed: 06/30/2022 5:28:23 PM By: Baruch Gouty RN, BSN Entered By: Baruch Gouty on 06/30/2022 10:32:04 -------------------------------------------------------------------------------- Lower Extremity Assessment Details Patient Name: Date of Service: Jacob Ache. 06/30/2022 10:30 A M Medical Record Number: [id number] Patient Account Number: [id number] Date of Birth/Sex: Treating RN: Jun 18, 1951 (71 y.o. [name] Primary Care Lanetra Hartley: Alain Honey Other Clinician: Referring Tajuan Dufault: Treating Umeka Wrench/Extender: Neil Crouch in Treatment: 17 Edema Assessment Assessed: [name]: No] Patrice Paradise: No] Edema: [Left: N] [Right: o] Calf Left: Right: Point of Measurement: From Medial Instep 32.8 cm Ankle Left: Right: Point of Measurement: From Medial Instep 21 cm Vascular Assessment Pulses: Dorsalis Pedis Palpable: [Right:Yes] Electronic Signature(s) Signed: 06/30/2022 5:28:23 PM By: Baruch Gouty RN, BSN Entered By: Baruch Gouty on 06/30/2022 09:53:54 -------------------------------------------------------------------------------- Multi Wound Chart Details Patient Name: Date of Service: Jacob Ache. 06/30/2022 10:30 A M Medical Record Number: [id number] Patient Account Number: [id number] Date of Birth/Sex: Treating RN: 1951/07/25 (71 y.o. [name] Primary Care Janaiyah Blackard: Alain Honey Other Clinician: Referring Sadler Teschner: Treating Kyden Potash/Extender: Neil Crouch in Treatment: 17 Vital Signs Height(in): 47 Capillary Blood Glucose(mg/dl): 200 Weight(lbs): 172 Pulse(bpm):  69 Body Mass Index(BMI): 22.1 Blood Pressure(mmHg): 119/61 Temperature(F): 97.7 Respiratory Rate(breaths/min): 18 Photos: [8:Right, Plantar Foot] [N/A:N/A N/A] Wound Location: [8:Blister] [N/A:N/A] Wounding Event: [8:Diabetic Wound/Ulcer of the Lower] [N/A:N/A] Primary Etiology: [8:Extremity Cataracts, Glaucoma, Anemia, Chronic N/A] Comorbid History: [8:Obstructive Pulmonary Disease (COPD), Congestive Heart Failure, Coronary Artery Disease, Hypertension, Myocardial Infarction, Hepatitis C, Type II Diabetes, Gout, Osteoarthritis, Neuropathy 03/08/2022] [N/A:N/A] Date Acquired: [8:16] [N/A:N/A] Weeks of Treatment: [8:Open] [N/A:N/A] Wound Status: [8:No] [N/A:N/A] Wound Recurrence: [8:0.9x0.8x1.2] [N/A:N/A] Measurements L x W x D (cm) [8:0.565] [N/A:N/A] A (cm) : rea [8:0.679] [N/A:N/A] Volume (  cm) : [8:48.60%] [N/A:N/A] % Reduction in A [8:rea: -105.80%] [N/A:N/A] % Reduction in Volume: [8:7] Starting Position 1 (o'clock): [8:1] Ending Position 1 (o'clock): [8:0.8] Maximum Distance 1 (cm): [8:Yes] [N/A:N/A] Undermining: [8:Grade 2] [N/A:N/A] Classification: [8:Medium] [N/A:N/A] Exudate A mount: [8:Serosanguineous] [N/A:N/A] Exudate Type: [8:red, brown] [N/A:N/A] Exudate Color: [8:Thickened] [N/A:N/A] Wound Margin: [8:Large (67-100%)] [N/A:N/A] Granulation A mount: [8:Red] [N/A:N/A] Granulation Quality: [8:Small (1-33%)] [N/A:N/A] Necrotic A mount: [8:Fat Layer (Subcutaneous Tissue): Yes N/A] Exposed Structures: [8:Fascia: No Tendon: No Muscle: No Joint: No Bone: No Small (1-33%)] [N/A:N/A] Epithelialization: [8:Debridement - Excisional] [N/A:N/A] Debridement: Pre-procedure Verification/Time Out 10:05 [N/A:N/A] Taken: [8:Lidocaine 4% Topical Solution] [N/A:N/A] Pain Control: [8:Callus, Subcutaneous] [N/A:N/A] Tissue Debrided: [8:Skin/Subcutaneous Tissue] [N/A:N/A] Level: [8:2.25] [N/A:N/A] Debridement A (sq cm): [8:rea Curette] [N/A:N/A] Instrument: [8:Minimum]  [N/A:N/A] Bleeding: [8:Pressure] [N/A:N/A] Hemostasis A chieved: [8:0] [N/A:N/A] Procedural Pain: [8:0] [N/A:N/A] Post Procedural Pain: [8:Procedure was tolerated well] [N/A:N/A] Debridement Treatment Response: [8:1x1.2x1.2] [N/A:N/A] Post Debridement Measurements L x W x D (cm) [8:1.131] [N/A:N/A] Post Debridement Volume: (cm) [8:Debridement] [N/A:N/A] Treatment Notes Electronic Signature(s) Signed: 06/30/2022 10:25:14 AM By: Fredirick Maudlin MD FACS Signed: 06/30/2022 5:28:23 PM By: Baruch Gouty RN, BSN Entered By: Fredirick Maudlin on 06/30/2022 10:25:14 -------------------------------------------------------------------------------- Multi-Disciplinary Care Plan Details Patient Name: Date of Service: Jacob Ache. 06/30/2022 10:30 A M Medical Record Number: [id number] Patient Account Number: [id number] Date of Birth/Sex: Treating RN: 1950-11-10 (71 y.o. [name] Primary Care Tonatiuh Mallon: Alain Honey Other Clinician: Referring Essie Gehret: Treating Gabrian Hoque/Extender: Neil Crouch in Treatment: [place] reviewed with physician Active Inactive Nutrition Nursing Diagnoses: Impaired glucose control: actual or potential Potential for alteratiion in Nutrition/Potential for imbalanced nutrition Goals: Patient/caregiver will maintain therapeutic glucose control Date Initiated: 05/10/2022 Target Resolution Date: 07/14/2022 Goal Status: Active Interventions: Assess HgA1c results as ordered upon admission and as needed Provide education on elevated blood sugars and impact on wound healing Treatment Activities: Dietary management education, guidance and counseling : 05/10/2022 Patient referred to Primary Care Physician for further nutritional evaluation : 05/10/2022 Notes: Wound/Skin Impairment Nursing Diagnoses: Impaired tissue integrity Goals: Patient/caregiver will verbalize understanding of skin care regimen Date  Initiated: 03/01/2022 Target Resolution Date: 07/14/2022 Goal Status: Active Interventions: Assess ulceration(s) every visit Treatment Activities: Skin care regimen initiated : 03/01/2022 Notes: Electronic Signature(s) Signed: 06/30/2022 5:28:23 PM By: Baruch Gouty RN, BSN Entered By: Baruch Gouty on 06/30/2022 09:57:12 -------------------------------------------------------------------------------- Pain Assessment Details Patient Name: Date of Service: Jacob Katos R. 06/30/2022 10:30 A M Medical Record Number: [id number] Patient Account Number: [id number] Date of Birth/Sex: Treating RN: August 07, 1951 (71 y.o. [name] Primary Care Devynne Sturdivant: Alain Honey Other Clinician: Referring Alarik Radu: Treating Krissi Willaims/Extender: Neil Crouch in Treatment: 17 Active Problems Location of Pain Severity and Description of Pain Patient Has Paino Yes Site Locations Pain Location: Pain Location: Generalized Pain With Dressing Change: No Duration of the Pain. Constant / Intermittento Intermittent Rate the pain. Current Pain Level: 5 Least Pain Level: 0 Character of Pain Describe the Pain: Sharp, Shooting Pain Management and Medication Current Pain Management: Medication: Yes Is the Current Pain Management Adequate: Adequate How does your wound impact your activities of daily livingo Sleep: No Bathing: No Appetite: No Relationship With Others: No Bladder Continence: No Emotions: No Bowel Continence: No Work: No Toileting: No Drive: No Dressing: No Hobbies: No Notes reports shooting pains in toes Electronic Signature(s) Signed: 06/30/2022 5:28:23 PM By: Baruch Gouty RN, BSN Entered By: Baruch Gouty on 06/30/2022 09:51:16 -------------------------------------------------------------------------------- Patient/Caregiver Education Details  Patient Name: Date of Service: Jacob [name], Jacob [name] 9/7/2023andnbsp10:30 A M Medical Record Number:  [id number] Patient Account Number: [id number] Date of Birth/Gender: Treating RN: 04/10/1951 (71 y.o. [name] Primary Care Physician: Alain Honey Other Clinician: Referring Physician: Treating Physician/Extender: Neil Crouch in Treatment: 17 Education Assessment Education Provided To: Patient Education Topics Provided Elevated Blood Sugar/ Impact on Healing: Methods: Explain/Verbal Responses: Reinforcements needed, State content correctly Offloading: Methods: Explain/Verbal Responses: Reinforcements needed, State content correctly Wound/Skin Impairment: Methods: Explain/Verbal Responses: Reinforcements needed, State content correctly Electronic Signature(s) Signed: 06/30/2022 5:28:23 PM By: Baruch Gouty RN, BSN Entered By: Baruch Gouty on 06/30/2022 10:01:26 -------------------------------------------------------------------------------- Wound Assessment Details Patient Name: Date of Service: Jacob Katos R. 06/30/2022 10:30 A M Medical Record Number: [id number] Patient Account Number: [id number] Date of Birth/Sex: Treating RN: [birth year] (71 y.o. [name] Primary Care Yovan Leeman: Alain Honey Other Clinician: Referring Alahia Whicker: Treating Baltasar Twilley/Extender: Neil Crouch in Treatment: 17 Wound Status Wound Number: 8 Primary Diabetic Wound/Ulcer of the Lower Extremity Etiology: Wound Location: Right, Plantar Foot Wound Open Wounding Event: Blister Status: Date Acquired: 03/08/2022 Comorbid Cataracts, Glaucoma, Anemia, Chronic Obstructive Pulmonary Weeks Of Treatment: 16 History: Disease (COPD), Congestive Heart Failure, Coronary Artery Clustered Wound: No Disease, Hypertension, Myocardial Infarction, Hepatitis C, Type II Diabetes, Gout, Osteoarthritis, Neuropathy Photos Wound Measurements Length: (cm) 0.9 Width: (cm) 0.8 Depth: (cm) 1.2 Area: (cm) 0.565 Volume: (cm) 0.679 %  Reduction in Area: 48.6% % Reduction in Volume: -105.8% Epithelialization: Small (1-33%) Tunneling: No Undermining: Yes Starting Position (o'clock): 7 Ending Position (o'clock): 1 Maximum Distance: (cm) 0.8 Wound Description Classification: Grade 2 Wound Margin: Thickened Exudate Amount: Medium Exudate Type: Serosanguineous Exudate Color: red, brown Foul Odor After Cleansing: No Slough/Fibrino Yes Wound Bed Granulation Amount: Large (67-100%) Exposed Structure Granulation Quality: Red Fascia Exposed: No Necrotic Amount: Small (1-33%) Fat Layer (Subcutaneous Tissue) Exposed: Yes Necrotic Quality: Adherent Slough Tendon Exposed: No Muscle Exposed: No Joint Exposed: No Bone Exposed: No Treatment Notes Wound #8 (Foot) Wound Laterality: Plantar, Right Cleanser Soap and Water Discharge Instruction: May shower and wash wound with dial antibacterial soap and water prior to dressing change. Wound Cleanser Discharge Instruction: Cleanse the wound with wound cleanser prior to applying a clean dressing using gauze sponges, not tissue or cotton balls. Peri-Wound Care Topical Primary Dressing Hydrofera Blue Ready Foam, 2.5 x2.5 in Discharge Instruction: Apply to wound bed as instructed Secondary Dressing Optifoam Non-Adhesive Dressing, 4x4 in Discharge Instruction: Apply over primary dressing cut to make foam donut Woven Gauze Sponges 2x2 in Discharge Instruction: Apply over primary dressing as directed. Secured With 1M Medipore H Soft Cloth Surgical T ape, 4 x 10 (in/yd) Discharge Instruction: Secure with tape as directed. Compression Wrap Compression Stockings Add-Ons Electronic Signature(s) Signed: 06/30/2022 5:28:23 PM By: Baruch Gouty RN, BSN Entered By: Baruch Gouty on 06/30/2022 09:59:16 -------------------------------------------------------------------------------- [place] Details Patient Name: Date of Service: Jacob Katos R. 06/30/2022 10:30 A M Medical  Record Number: [id number] Patient Account Number: [id number] Date of Birth/Sex: Treating RN: [birth year] (71 y.o. [name] Primary Care Temperance Kelemen: Alain Honey Other Clinician: Referring Dalaysia Harms: Treating Kaari Zeigler/Extender: Neil Crouch in Treatment: 17 Vital Signs Time Taken: 09:49 Temperature (F): 97.7 Height (in): 74 Pulse (bpm): 70 Weight (lbs): 172 Respiratory Rate (breaths/min): 18 Body Mass Index (BMI): 22.1 Blood Pressure (mmHg): 119/61 Capillary Blood Glucose (mg/dl): 200 Reference Range: 80 - 120 mg / dl Notes glucose per pt report this am  Electronic Signature(s) Signed: 06/30/2022 5:28:23 PM By: Baruch Gouty RN, BSN Entered By: Baruch Gouty on 06/30/2022 09:50:18

## 2022-07-01 DIAGNOSIS — Z992 Dependence on renal dialysis: Secondary | ICD-10-CM | POA: Diagnosis not present

## 2022-07-01 DIAGNOSIS — N186 End stage renal disease: Secondary | ICD-10-CM | POA: Diagnosis not present

## 2022-07-01 DIAGNOSIS — D689 Coagulation defect, unspecified: Secondary | ICD-10-CM | POA: Diagnosis not present

## 2022-07-01 DIAGNOSIS — N2581 Secondary hyperparathyroidism of renal origin: Secondary | ICD-10-CM | POA: Diagnosis not present

## 2022-07-01 DIAGNOSIS — D631 Anemia in chronic kidney disease: Secondary | ICD-10-CM | POA: Diagnosis not present

## 2022-07-04 DIAGNOSIS — D631 Anemia in chronic kidney disease: Secondary | ICD-10-CM | POA: Diagnosis not present

## 2022-07-04 DIAGNOSIS — D689 Coagulation defect, unspecified: Secondary | ICD-10-CM | POA: Diagnosis not present

## 2022-07-04 DIAGNOSIS — N186 End stage renal disease: Secondary | ICD-10-CM | POA: Diagnosis not present

## 2022-07-04 DIAGNOSIS — Z992 Dependence on renal dialysis: Secondary | ICD-10-CM | POA: Diagnosis not present

## 2022-07-04 DIAGNOSIS — N2581 Secondary hyperparathyroidism of renal origin: Secondary | ICD-10-CM | POA: Diagnosis not present

## 2022-07-06 DIAGNOSIS — N2581 Secondary hyperparathyroidism of renal origin: Secondary | ICD-10-CM | POA: Diagnosis not present

## 2022-07-06 DIAGNOSIS — D631 Anemia in chronic kidney disease: Secondary | ICD-10-CM | POA: Diagnosis not present

## 2022-07-06 DIAGNOSIS — Z992 Dependence on renal dialysis: Secondary | ICD-10-CM | POA: Diagnosis not present

## 2022-07-06 DIAGNOSIS — N186 End stage renal disease: Secondary | ICD-10-CM | POA: Diagnosis not present

## 2022-07-06 DIAGNOSIS — D689 Coagulation defect, unspecified: Secondary | ICD-10-CM | POA: Diagnosis not present

## 2022-07-08 DIAGNOSIS — D689 Coagulation defect, unspecified: Secondary | ICD-10-CM | POA: Diagnosis not present

## 2022-07-08 DIAGNOSIS — N186 End stage renal disease: Secondary | ICD-10-CM | POA: Diagnosis not present

## 2022-07-08 DIAGNOSIS — Z992 Dependence on renal dialysis: Secondary | ICD-10-CM | POA: Diagnosis not present

## 2022-07-08 DIAGNOSIS — D631 Anemia in chronic kidney disease: Secondary | ICD-10-CM | POA: Diagnosis not present

## 2022-07-08 DIAGNOSIS — N2581 Secondary hyperparathyroidism of renal origin: Secondary | ICD-10-CM | POA: Diagnosis not present

## 2022-07-11 ENCOUNTER — Other Ambulatory Visit: Payer: Self-pay

## 2022-07-11 DIAGNOSIS — D631 Anemia in chronic kidney disease: Secondary | ICD-10-CM | POA: Diagnosis not present

## 2022-07-11 DIAGNOSIS — Z992 Dependence on renal dialysis: Secondary | ICD-10-CM | POA: Diagnosis not present

## 2022-07-11 DIAGNOSIS — N2581 Secondary hyperparathyroidism of renal origin: Secondary | ICD-10-CM | POA: Diagnosis not present

## 2022-07-11 DIAGNOSIS — N186 End stage renal disease: Secondary | ICD-10-CM | POA: Diagnosis not present

## 2022-07-11 DIAGNOSIS — D689 Coagulation defect, unspecified: Secondary | ICD-10-CM | POA: Diagnosis not present

## 2022-07-11 MED ORDER — OXYCODONE-ACETAMINOPHEN 5-325 MG PO TABS: 1.0000 | ORAL_TABLET | Freq: Four times a day (QID) | ORAL | 0 refills | Status: DC | PRN

## 2022-07-11 NOTE — Telephone Encounter (Signed)
Patient called and requested refill on Oxycodone 5-'325mg'$ .   Medication pended and sent to Dr. Lillette Boxer. Sabra Heck

## 2022-07-13 DIAGNOSIS — D689 Coagulation defect, unspecified: Secondary | ICD-10-CM | POA: Diagnosis not present

## 2022-07-13 DIAGNOSIS — N2581 Secondary hyperparathyroidism of renal origin: Secondary | ICD-10-CM | POA: Diagnosis not present

## 2022-07-13 DIAGNOSIS — D631 Anemia in chronic kidney disease: Secondary | ICD-10-CM | POA: Diagnosis not present

## 2022-07-13 DIAGNOSIS — N186 End stage renal disease: Secondary | ICD-10-CM | POA: Diagnosis not present

## 2022-07-13 DIAGNOSIS — Z992 Dependence on renal dialysis: Secondary | ICD-10-CM | POA: Diagnosis not present

## 2022-07-14 ENCOUNTER — Encounter (HOSPITAL_BASED_OUTPATIENT_CLINIC_OR_DEPARTMENT_OTHER): Payer: Medicare Other | Admitting: General Surgery

## 2022-07-14 DIAGNOSIS — N2581 Secondary hyperparathyroidism of renal origin: Secondary | ICD-10-CM | POA: Diagnosis not present

## 2022-07-14 DIAGNOSIS — N182 Chronic kidney disease, stage 2 (mild): Secondary | ICD-10-CM | POA: Diagnosis not present

## 2022-07-14 DIAGNOSIS — E11621 Type 2 diabetes mellitus with foot ulcer: Secondary | ICD-10-CM | POA: Diagnosis not present

## 2022-07-14 DIAGNOSIS — I5042 Chronic combined systolic (congestive) and diastolic (congestive) heart failure: Secondary | ICD-10-CM | POA: Diagnosis not present

## 2022-07-14 DIAGNOSIS — J449 Chronic obstructive pulmonary disease, unspecified: Secondary | ICD-10-CM | POA: Diagnosis not present

## 2022-07-14 DIAGNOSIS — L97512 Non-pressure chronic ulcer of other part of right foot with fat layer exposed: Secondary | ICD-10-CM | POA: Diagnosis not present

## 2022-07-14 DIAGNOSIS — E1122 Type 2 diabetes mellitus with diabetic chronic kidney disease: Secondary | ICD-10-CM | POA: Diagnosis not present

## 2022-07-14 DIAGNOSIS — I132 Hypertensive heart and chronic kidney disease with heart failure and with stage 5 chronic kidney disease, or end stage renal disease: Secondary | ICD-10-CM | POA: Diagnosis not present

## 2022-07-14 NOTE — Progress Notes (Addendum)
Jacob [name], Jacob [name] ([id number]) Visit Report for 07/14/2022 Chief Complaint Document Details Patient Name: Date of Service: Jacob [name], Jacob [name] 07/14/2022 12:30 PM Medical Record Number: [id number] Patient Account Number: [id number] Date of Birth/Sex: Treating RN: 08-04-1951 (71 y.o. M) Primary Care Provider: Alain Honey Other Clinician: Referring Provider: Treating Provider/Extender: Neil Crouch in Treatment: 19 Information Obtained from: Patient Chief Complaint Patient seen for complaints of Non-Healing Wounds x 3 Electronic Signature(s) Signed: 07/14/2022 1:05:40 PM By: Fredirick Maudlin MD FACS Entered By: Fredirick Maudlin on 07/14/2022 13:05:39 -------------------------------------------------------------------------------- Debridement Details Patient Name: Date of Service: Jacob Ache. 07/14/2022 12:30 PM Medical Record Number: [id number] Patient Account Number: [id number] Date of Birth/Sex: Treating RN: 19-Feb-1951 (71 y.o. [name] Primary Care Provider: Alain Honey Other Clinician: Referring Provider: Treating Provider/Extender: Neil Crouch in Treatment: 19 Debridement Performed for Assessment: Wound #8 Right,Plantar Foot Performed By: Physician Fredirick Maudlin, MD Debridement Type: Debridement Severity of Tissue Pre Debridement: Fat layer exposed Level of Consciousness (Pre-procedure): Awake and Alert Pre-procedure Verification/Time Out Yes - 13:00 Taken: Start Time: 13:00 Pain Control: Lidocaine 4% T opical Solution T Area Debrided (L x W): otal 0.7 (cm) x 0.7 (cm) = 0.49 (cm) Tissue and other material debrided: Viable, Non-Viable, Slough, Subcutaneous, Slough Level: Skin/Subcutaneous Tissue Debridement Description: Excisional Instrument: Curette Bleeding: Minimum Hemostasis Achieved: Pressure Procedural Pain: 0 Post Procedural Pain: 0 Response to Treatment: Procedure was tolerated  well Level of Consciousness (Post- Awake and Alert procedure): Post Debridement Measurements of Total Wound Length: (cm) 0.7 Width: (cm) 0.7 Depth: (cm) 2.4 Volume: (cm) 0.924 Character of Wound/Ulcer Post Debridement: Improved Severity of Tissue Post Debridement: Fat layer exposed Post Procedure Diagnosis Same as Pre-procedure Electronic Signature(s) Signed: 07/14/2022 3:00:56 PM By: Fredirick Maudlin MD FACS Signed: 07/14/2022 5:15:24 PM By: Baruch Gouty RN, BSN Entered By: Baruch Gouty on 07/14/2022 13:02:32 -------------------------------------------------------------------------------- HPI Details Patient Name: Date of Service: Jacob Katos R. 07/14/2022 12:30 PM Medical Record Number: [id number] Patient Account Number: [id number] Date of Birth/Sex: Treating RN: 07/07/1951 (71 y.o. M) Primary Care Provider: Alain Honey Other Clinician: Referring Provider: Treating Provider/Extender: Neil Crouch in Treatment: 19 History of Present Illness Location: 2 ulcers noted to the left hallux Quality: the patient denies pain Duration: these were originally blisters towards the end of February and opened in early March Context: these wounds are secondary to pressure; he got new shoes in February and also started wearing a toe cover/sleeve Modifying Factors: pressure and friction are exacerbating factor HPI Description: 01/12/17- Jacob [name] arrives for initial evaluation of 2 ulcers to his left hallux. He states that back in February he did receive a new pair of diabetic shoes and at that time he started wearing toe cover/sleeves and noticed development of a blister to the dorsal and plantar aspect. He has been seen at [facility] by both Dr. Amalia Hailey and Dr. Jacqualyn Posey. He last saw Dr.Wagoner on 3/8 at which time he was prescribed Silvadene and Keflex. He completed that dose of Keflex and states that he got a phone call to extend the Keflex prior to this  appointment. He has a follow-up with Dr. Amalia Hailey on 3/26. He has been wearing a surgical shoe, no significant offloading to the plantar/medial ulcer but the strap does not extend over the dorsal ulcer. Topically he has been applying the Silvadene daily and a dry dressing. He is a diabetic, on insulin, with a recent A1c of 6.2% (  11/10/16). He is a current smoker, 0.5 PPD. He has no known diagnosis of arterial insufficiency. 01/20/17- The patient arrives for follow up evaluation of his ulcers to the left hallux. He admits to continuing to smoke approximately 0.5ppd. He states that his diet has been somewhat uncontrolled with higher than normal glucose levels, this seems circumstantial as he had a death in the family as food and schedule has been disrupted. he admits to wearing the surgical show with offloading modifications while out of the home, but has been ambulating barefoot around the home. he saw Dr. Amalia Hailey yesterday for toenail trimming and callus shaving to the right plantar foot and investigation of his ulcers. 01/26/17- he is here for follow-up evaluation of his left hallux ulcers. He states he has been significantly more compliant in the use of his offloading surgical shoe, with the exception of "getting up for a glass of water" he states that he has worn it with any ambulation. He states he has decreased his smoking too, admitting to only a cigarette since yesterday. He states his blood sugar this morning was 147 and yesterday was 116. He is voicing no complaints or concerns 02/02/17- He is here for follow-up evaluation of his left hallux ulcers. The dorsal ulcer is crusted over/healed. He states he has minimized his ambulation over this past week, much less than the previous week. He states his glucose levels have been between 120-150 about half of the time, otherwise greater than 150. He cannot correlate any specific dietary changes. He does have an appointment with a dietitian next month. He was  advised to keep a log of his glucose levels and his diet for that appointment. 02/06/17; patient comes in today for first application of the total contact cast. Her intake nurse reported the wounds look better therefore I did not actually see these before application of the cast. He'll return on Thursday for reapplication in the standard fashion 02/10/27- he is here for follow-up evaluation of his left plantar hallux ulcer. He came in Monday for application of a total contact cast. He states that he was tolerating that Monday, but noticed on Tuesday that his foot began to slide in the cast. He also is complaining of the heaviness of the cast, as he ambulates as his primary form of transportation. He continues to smoke,. He has not contacted his cardiologist regarding Nicorette gum. He states his blood sugars have been slightly elevated, this morning being 206. He states that he will complete an antibiotic tonight. He states this was prescribed by Dr. Jacqualyn Posey. He states that he has refilled this prescription twice, without direction to continue taking this medication. 02/16/17; patient wound about the same as last week per her description. Rolled edges of callus scan and nonviable subcutaneous tissue. Using Prisma. Apparently used topical antibiotic instead of the Prisma over the course of this week. He has not a total contact cast candidate secondary to ambulation needs and unsteadiness on his feet per discussion with our intake nurse 03/02/17 patient requires debridement. He has a small wound on the right great toe which probes however does not approach bone. What I can see of the base of this wound looks reasonably healthy and I think this is largely in offloading issue with the second toe crossing over the first. He did not tolerate a total contact cast has balance issues 5/171/8. 0.2x0.2x0.2 less depth. Using prisma 03/16/17; no major change. Still a small probing wound with copious amounts of  surrounding callus skin nonviable  subcutaneous tissue all of which debrided. Still using Prisma. I suspect the dimensions post debridement are about the same as listed above 0.2 x 0.2 x 0.2 03/23/17; no major change using Prisma. Once again a large amount of callus nonviable subcutaneous tissue around the wound. I went over the offloading issues with the patient and the nurse. He did not tolerate a total contact cast he uses public transportation among other issues. 03/30/17; no major change using Prisma. Once again a large amount of callus and nonviable tissue around the wound requires debridement. Saw Dr. Amalia Hailey who pared calluses on the other foot. 04/06/17; the wound is much smaller there is callus around this although I think I can see the base of the wound and I elected not to do any debridement. 04/13/17 this is a wound I did not debridement last week left callus intact. He arrives today with the area much in the same predicament however this time I remove the callus to find a deep probing hole. This is disappointing this is not go to bone. We have been using Prisma. 04/20/17 plantar left first toe. Again although a lot of circumferential callus around the wound which requires debridement however this does not appear to be a probing area. Culture I did last week show coag-negative staph. I thought this was probably not something that was worth treating but In the interim he was seen by his primary care with parotitis. He is now on clindamycin and Cipro and the Staphylococcus we cultured is Cipro sensitive [lugdunensis} 04/27/17; plantar left first toe. Again a lot of circumferential callus around a small open area. Removing the callus also nonviable subcutaneous tissue. The wound looks better today. He has completed his antibiotics for the parotitis. I didn't think additional antibiotics were necessary in terms of the wound 05/11/17; plantar first toe. I thought this might be closed this week however once  again he has a deep probing small hole. Using pickups and a scalpel debridement of thick callus and subcutaneous tissue from around the wound reveals a depth of about 0.4 cm. We have not been able to offload the area. There is no obvious infection here. No evidence of ischemia 05/18/17; since his last visit he's been in hospital for oral surgery apparently to remove a retained surgical screw. He has swelling of his jaw and has some discomfort. He arrives today with the plantar left first toe in the same predicament. No major change. Each time he comes in here he has thick adherent callus 05/25/17; arrives today with roughly the same condition of the plantar left great toe. Perhaps somewhat better less callus. There are no options for offloading. He is on his feet a lot and I think this is largely a pressure issue X-ray I did last week showed no evidence of osteomyelitis. He has a toe separator as the great toe tends to ride under the second toe when he is walking. I'm hopeful that this will allow for a little less pressure on the plantar aspect of the left great toe 06/07/2017 -- he is working hard on giving up smoking and other than that has been wearing his diabetic shoes and is also trying to offload as much as possible. 06/15/17; patient arrived in clinic today with a difficult area on his left medial great toe closed over. There is still amount of callus on this however I don't believe there is any underlying open wound. He has custom shoes with inserts. I still think he is going  to need to offload this area going forward. He also follows with Dr. Amalia Hailey of podiatry for routine foot care READMISSION 11/01/16; patient arrives in clinic today with a five-day history of a new area on the previously problematic left great toe. He is wearing his diabetic shoes and he apparently has a new pair coming. The patient is a type II diabetic with neuropathy. He follows with Dr. Amalia Hailey of podiatry for pre-ulcerative  callus on his bilateral feet last seen on 08/24/17. He has developed a large blister on top of his left great toe this is since gone down somewhat. He is not been dressing this specifically. He does not have a history of significant PAD previous ABIs in this clinic were1.1 11/06/17; left great toe has epithelialized since last time. I think this was caused by a insoles an issue pushing the toe against the dorsal aspect of the shoe itself has new diabetic shoes coming hopefully will have more forefoot depth. READMISSION 03/01/2022 This is a now 71 year old man with a past medical history significant for type 2 diabetes mellitus with end-stage renal disease on hemodialysis as as well as congestive heart failure, ischemic cardiomyopathy, hypertension, and prior diabetic foot ulcers. He has had multiple issues with his hemodialysis access. He had a prior left radiocephalic fistula that failed. In October 2020, he had a left brachiobasilic fistula. He apparently underwent a procedure at an outside vascular office on November 09, 2021. Subsequent to that, he developed a wound on his left posterior forearm just distal to the elbow as well as wounds on 2 of his left digits. He was diagnosed with steal syndrome and recently underwent ligation of his fistula with placement of a hemodialysis catheter. He is here today for assistance in healing the wounds that developed related to steal syndrome. He says that he has been applying Neosporin to the sites and they are fairly sensitive to the touch. 03/08/2022: The wounds on his fingers are much smaller and have just a little bit of accumulated thin eschar. The wound on his left posterior forearm is also smaller but has accumulated both slough and eschar. He also has a thick callus on his right fifth metatarsal head plantar surface and unfortunately, it appears that there is an open wound underneath the callus. We have been using Iodosorb with Hydrofera Blue to all of  his open wounds. Pain is much improved from last week. 03/15/2022: The wounds on his fingers have healed. The wound on his left posterior forearm is smaller and has a nice layer of granulation tissue with just a bit of accumulated slough. He has built up additional callus around the right fifth metatarsal head plantar surface, but the underlying wound has a good surface with granulation tissue present. We have been using Iodosorb with Hydrofera Blue. He did not endorse any pain this week. 03/22/2022: The wound on his left posterior forearm continues to contract. There is good granulation tissue present and just a little bit of periwound eschar and overlying slough. The right fifth metatarsal head wound has once again accumulated some callus and there is slough within the wound. The intake nurse measured a area of undermining from 7-10 o'clock. 03/29/2022: The patient saw podiatry this week for nail trim and they also pared back a number of calluses. The wound on his left posterior forearm is substantially smaller with just some overlying thin eschar. The right fifth metatarsal head has a bit of callus accumulation as well as some slough in the wound bed  but is otherwise clean. It is smaller today, as well. 04/05/2022: The wound on his left posterior forearm is nearly closed with just a small amount of overlying eschar. The right fifth metatarsal head wound is smaller again this week. There is some periwound callus accumulation and some slough in the wound bed. He is going to get new dialysis access placed next week. 04/19/2022: The wound on his left posterior forearm is closed. The right fifth metatarsal head wound is about the same size with periwound callus and slough accumulation. He did have a new right forearm PTFE AV graft placed last week. His right forearm and hand are quite swollen, warm, and red today. He was apparently seen in the emergency room yesterday and they did not think it was infected  and attributed the swelling to normal postop findings. 04/28/2022: The wound on his foot has accumulated a lot of periwound callus, but the actual open surface is quite small. There is just a little bit of slough on the surface. 05/03/2022: His wound is even smaller today with less periwound callus accumulation. He continues to build up slough but the underlying tissue is healthy and viable-appearing. 05/10/2022: The orifice of the wound continues to contract, but there is some undermining and periwound callus formation. No significant accumulation of slough. 05/17/2022: The wound is about the same size today, perhaps slightly smaller. He does have periwound callus accumulation, but it does not overlap or create any undermining. There is some slough in the base of the wound. 05/31/2022: The wound measured larger today on intake, but this may be secondary to the fairly aggressive debridement I performed at his last visit. Despite this, he has built up callus around the wound again. There is some epiboly around the orifice. There is slough within the wound and some undermining is present. His blood sugar remains very poorly controlled; it was 300 today. 06/16/2022: No real change in the size of his wound. He has callus accumulation around the perimeter. Last visit we changed his dressing to Prisma; I have not seen much in the way of filling in from the base yet. 06/30/2022: No real change to his wound. There is some undermining that has been created by accumulated callus. We are struggling with poor glucose control in the setting of immunodeficiency and end-stage renal disease on dialysis. 07/14/2022: The wound has gotten deeper and a probe placed into the wound can be palpated coming to just under the skin on the dorsal aspect of his foot. The wound itself is fairly clean and there is no malodor or purulent drainage present. He is scheduled to see podiatry next week and hopefully get x-rays performed at that  visit. He is also requesting a knee scooter. Electronic Signature(s) Signed: 07/14/2022 1:06:57 PM By: Fredirick Maudlin MD FACS Signed: 07/14/2022 1:06:57 PM By: Fredirick Maudlin MD FACS Entered By: Fredirick Maudlin on 07/14/2022 13:06:57 -------------------------------------------------------------------------------- Physical Exam Details Patient Name: Date of Service: Jacob Ache. 07/14/2022 12:30 PM Medical Record Number: [id number] Patient Account Number: [id number] Date of Birth/Sex: Treating RN: 10-13-1951 (71 y.o. M) Primary Care Provider: Alain Honey Other Clinician: Referring Provider: Treating Provider/Extender: Neil Crouch in Treatment: 19 Constitutional . . . . No acute distress.[name] Respiratory Normal work of breathing on room air.. Notes 07/14/2022: The wound has gotten deeper and a probe placed into the wound can be palpated coming to just under the skin on the dorsal aspect of his foot. The wound itself is fairly  clean and there is no malodor or purulent drainage present. Electronic Signature(s) Signed: 07/14/2022 1:07:30 PM By: Fredirick Maudlin MD FACS Entered By: Fredirick Maudlin on 07/14/2022 13:07:30 -------------------------------------------------------------------------------- Physician Orders Details Patient Name: Date of Service: Jacob Ache. 07/14/2022 12:30 PM Medical Record Number: [id number] Patient Account Number: [id number] Date of Birth/Sex: Treating RN: 11-18-1950 (71 y.o. [name] Primary Care Provider: Alain Honey Other Clinician: Referring Provider: Treating Provider/Extender: Neil Crouch in Treatment: 19 Verbal / Phone Orders: No Diagnosis Coding ICD-10 Coding Code Description [phone] Non-pressure chronic ulcer of other part of right foot with fat layer exposed E11.22 Type 2 diabetes mellitus with diabetic chronic kidney disease J44.9 Chronic obstructive pulmonary  disease, unspecified I50.42 Chronic combined systolic (congestive) and diastolic (congestive) heart failure I10 Essential (primary) hypertension N25.81 Secondary hyperparathyroidism of renal origin N18.6 End stage renal disease B20 Human immunodeficiency virus [HIV] disease Follow-up Appointments ppointment in 1 week. - Dr. Celine Ahr RM 1 with [name] Return A Anesthetic Wound #8 Right,Plantar Foot (In clinic) Topical Lidocaine 4% applied to wound bed Bathing/ Shower/ Hygiene Other Bathing/Shower/Hygiene Orders/Instructions: - Change dressing after bathing Off-Loading Other: - knee scooter for right leg, wear custom diabetic shoes at all times when walking, minimal weight bearing right foot Wound Treatment Wound #8 - Foot Wound Laterality: Plantar, Right Cleanser: Soap and Water Every Other Day/15 Days Discharge Instructions: May shower and wash wound with dial antibacterial soap and water prior to dressing change. Cleanser: Wound Cleanser (Generic) Every Other Day/15 Days Discharge Instructions: Cleanse the wound with wound cleanser prior to applying a clean dressing using gauze sponges, not tissue or cotton balls. Topical: Gentamicin Every Other Day/15 Days Discharge Instructions: apply to packing strip with dressing changes Prim Dressing: Iodoform packing strip 1/2 (in) Every Other Day/15 Days ary Discharge Instructions: Lightly pack as instructed Secondary Dressing: Optifoam Non-Adhesive Dressing, 4x4 in Every Other Day/15 Days Discharge Instructions: Apply over primary dressing cut to make foam donut Secondary Dressing: Woven Gauze Sponges 2x2 in Every Other Day/15 Days Discharge Instructions: Apply over primary dressing as directed. Secured With: 29M Medipore H Soft Cloth Surgical T ape, 4 x 10 (in/yd) Every Other Day/15 Days Discharge Instructions: Secure with tape as directed. Patient Medications llergies: Amphetamine Analogues A Notifications Medication Indication Start  End 07/14/2022 gentamicin DOSE topical 0.1 % ointment - Apply liberally to packing strips for use with dressing changes Electronic Signature(s) Signed: 07/18/2022 9:47:33 AM By: Fredirick Maudlin MD FACS Signed: 07/18/2022 5:20:46 PM By: Baruch Gouty RN, BSN Previous Signature: 07/14/2022 1:08:30 PM Version By: Fredirick Maudlin MD FACS Entered By: Baruch Gouty on 07/18/2022 09:01:51 -------------------------------------------------------------------------------- Problem List Details Patient Name: Date of Service: Jacob Ache. 07/14/2022 12:30 PM Medical Record Number: [id number] Patient Account Number: [id number] Date of Birth/Sex: Treating RN: 03-03-1951 (71 y.o. [name] Primary Care Provider: Alain Honey Other Clinician: Referring Provider: Treating Provider/Extender: Neil Crouch in Treatment: 19 Active Problems ICD-10 Encounter Code Description Active Date MDM Diagnosis L97.512 Non-pressure chronic ulcer of other part of right foot with fat layer exposed 03/08/2022 No Yes E11.22 Type 2 diabetes mellitus with diabetic chronic kidney disease 03/01/2022 No Yes J44.9 Chronic obstructive pulmonary disease, unspecified 03/01/2022 No Yes I50.42 Chronic combined systolic (congestive) and diastolic (congestive) heart failure 03/01/2022 No Yes I10 Essential (primary) hypertension 03/01/2022 No Yes N25.81 Secondary hyperparathyroidism of renal origin 03/01/2022 No Yes N18.6 End stage renal disease 03/01/2022 No Yes B20 Human immunodeficiency virus [HIV] disease 03/01/2022  No Yes Inactive Problems Resolved Problems ICD-10 Code Description Active Date Resolved Date L98.499 Non-pressure chronic ulcer of skin of other sites with unspecified severity 03/01/2022 03/01/2022 Electronic Signature(s) Signed: 07/14/2022 1:05:23 PM By: Fredirick Maudlin MD FACS Entered By: Fredirick Maudlin on 07/14/2022  13:05:23 -------------------------------------------------------------------------------- Progress Note Details Patient Name: Date of Service: Jacob Ache. 07/14/2022 12:30 PM Medical Record Number: [id number] Patient Account Number: [id number] Date of Birth/Sex: Treating RN: 1951-04-30 (71 y.o. M) Primary Care Provider: Alain Honey Other Clinician: Referring Provider: Treating Provider/Extender: Neil Crouch in Treatment: 19 Subjective Chief Complaint Information obtained from Patient Patient seen for complaints of Non-Healing Wounds x 3 History of Present Illness (HPI) The following HPI elements were documented for the patient's wound: Location: 2 ulcers noted to the left hallux Quality: the patient denies pain Duration: these were originally blisters towards the end of February and opened in early March Context: these wounds are secondary to pressure; he got new shoes in February and also started wearing a toe cover/sleeve Modifying Factors: pressure and friction are exacerbating factor 01/12/17- Jacob [name] arrives for initial evaluation of 2 ulcers to his left hallux. He states that back in February he did receive a new pair of diabetic shoes and at that time he started wearing toe cover/sleeves and noticed development of a blister to the dorsal and plantar aspect. He has been seen at [facility] by both Dr. Amalia Hailey and Dr. Jacqualyn Posey. He last saw Dr.Wagoner on 3/8 at which time he was prescribed Silvadene and Keflex. He completed that dose of Keflex and states that he got a phone call to extend the Keflex prior to this appointment. He has a follow-up with Dr. Amalia Hailey on 3/26. He has been wearing a surgical shoe, no significant offloading to the plantar/medial ulcer but the strap does not extend over the dorsal ulcer. T opically he has been applying the Silvadene daily and a dry dressing. He is a diabetic, on insulin, with a recent A1c of 6.2%  (11/10/16). He is a current smoker, 0.5 PPD. He has no known diagnosis of arterial insufficiency. 01/20/17- The patient arrives for follow up evaluation of his ulcers to the left hallux. He admits to continuing to smoke approximately 0.5ppd. He states that his diet has been somewhat uncontrolled with higher than normal glucose levels, this seems circumstantial as he had a death in the family as food and schedule has been disrupted. he admits to wearing the surgical show with offloading modifications while out of the home, but has been ambulating barefoot around the home. he saw Dr. Amalia Hailey yesterday for toenail trimming and callus shaving to the right plantar foot and investigation of his ulcers. 01/26/17- he is here for follow-up evaluation of his left hallux ulcers. He states he has been significantly more compliant in the use of his offloading surgical shoe, with the exception of "getting up for a glass of water" he states that he has worn it with any ambulation. He states he has decreased his smoking too, admitting to only a cigarette since yesterday. He states his blood sugar this morning was 147 and yesterday was 116. He is voicing no complaints or concerns 02/02/17- He is here for follow-up evaluation of his left hallux ulcers. The dorsal ulcer is crusted over/healed. He states he has minimized his ambulation over this past week, much less than the previous week. He states his glucose levels have been between 120-150 about half of the time, otherwise greater than  150. He cannot correlate any specific dietary changes. He does have an appointment with a dietitian next month. He was advised to keep a log of his glucose levels and his diet for that appointment. 02/06/17; patient comes in today for first application of the total contact cast. Her intake nurse reported the wounds look better therefore I did not actually see these before application of the cast. He'll return on Thursday for reapplication in the  standard fashion 02/10/27- he is here for follow-up evaluation of his left plantar hallux ulcer. He came in Monday for application of a total contact cast. He states that he was tolerating that Monday, but noticed on Tuesday that his foot began to slide in the cast. He also is complaining of the heaviness of the cast, as he ambulates as his primary form of transportation. He continues to smoke,. He has not contacted his cardiologist regarding Nicorette gum. He states his blood sugars have been slightly elevated, this morning being 206. He states that he will complete an antibiotic tonight. He states this was prescribed by Dr. Jacqualyn Posey. He states that he has refilled this prescription twice, without direction to continue taking this medication. 02/16/17; patient wound about the same as last week per her description. Rolled edges of callus scan and nonviable subcutaneous tissue. Using Prisma. Apparently used topical antibiotic instead of the Prisma over the course of this week. He has not a total contact cast candidate secondary to ambulation needs and unsteadiness on his feet per discussion with our intake nurse 03/02/17 patient requires debridement. He has a small wound on the right great toe which probes however does not approach bone. What I can see of the base of this wound looks reasonably healthy and I think this is largely in offloading issue with the second toe crossing over the first. He did not tolerate a total contact cast has balance issues 5/171/8. 0.2x0.2x0.2 less depth. Using prisma 03/16/17; no major change. Still a small probing wound with copious amounts of surrounding callus skin nonviable subcutaneous tissue all of which debrided. Still using Prisma. I suspect the dimensions post debridement are about the same as listed above 0.2 x 0.2 x 0.2 03/23/17; no major change using Prisma. Once again a large amount of callus nonviable subcutaneous tissue around the wound. I went over the  offloading issues with the patient and the nurse. He did not tolerate a total contact cast he uses public transportation among other issues. 03/30/17; no major change using Prisma. Once again a large amount of callus and nonviable tissue around the wound requires debridement. Saw Dr. Amalia Hailey who pared calluses on the other foot. 04/06/17; the wound is much smaller there is callus around this although I think I can see the base of the wound and I elected not to do any debridement. 04/13/17 this is a wound I did not debridement last week left callus intact. He arrives today with the area much in the same predicament however this time I remove the callus to find a deep probing hole. This is disappointing this is not go to bone. We have been using Prisma. 04/20/17 plantar left first toe. Again although a lot of circumferential callus around the wound which requires debridement however this does not appear to be a probing area. Culture I did last week show coag-negative staph. I thought this was probably not something that was worth treating but In the interim he was seen by his primary care with parotitis. He is now on clindamycin and  Cipro and the Staphylococcus we cultured is Cipro sensitive [lugdunensis} 04/27/17; plantar left first toe. Again a lot of circumferential callus around a small open area. Removing the callus also nonviable subcutaneous tissue. The wound looks better today. He has completed his antibiotics for the parotitis. I didn't think additional antibiotics were necessary in terms of the wound 05/11/17; plantar first toe. I thought this might be closed this week however once again he has a deep probing small hole. Using pickups and a scalpel debridement of thick callus and subcutaneous tissue from around the wound reveals a depth of about 0.4 cm. We have not been able to offload the area. There is no obvious infection here. No evidence of ischemia 05/18/17; since his last visit he's been in  hospital for oral surgery apparently to remove a retained surgical screw. He has swelling of his jaw and has some discomfort. He arrives today with the plantar left first toe in the same predicament. No major change. Each time he comes in here he has thick adherent callus 05/25/17; arrives today with roughly the same condition of the plantar left great toe. Perhaps somewhat better less callus. There are no options for offloading. He is on his feet a lot and I think this is largely a pressure issue X-ray I did last week showed no evidence of osteomyelitis. He has a toe separator as the great toe tends to ride under the second toe when he is walking. I'm hopeful that this will allow for a little less pressure on the plantar aspect of the left great toe 06/07/2017 -- he is working hard on giving up smoking and other than that has been wearing his diabetic shoes and is also trying to offload as much as possible. 06/15/17; patient arrived in clinic today with a difficult area on his left medial great toe closed over. There is still amount of callus on this however I don't believe there is any underlying open wound. He has custom shoes with inserts. I still think he is going to need to offload this area going forward. He also follows with Dr. Amalia Hailey of podiatry for routine foot care READMISSION 11/01/16; patient arrives in clinic today with a five-day history of a new area on the previously problematic left great toe. He is wearing his diabetic shoes and he apparently has a new pair coming. The patient is a type II diabetic with neuropathy. He follows with Dr. Amalia Hailey of podiatry for pre-ulcerative callus on his bilateral feet last seen on 08/24/17. He has developed a large blister on top of his left great toe this is since gone down somewhat. He is not been dressing this specifically. He does not have a history of significant PAD previous ABIs in this clinic were1.1 11/06/17; left great toe has epithelialized  since last time. I think this was caused by a insoles an issue pushing the toe against the dorsal aspect of the shoe itself has new diabetic shoes coming hopefully will have more forefoot depth. READMISSION 03/01/2022 This is a now 71 year old man with a past medical history significant for type 2 diabetes mellitus with end-stage renal disease on hemodialysis as as well as congestive heart failure, ischemic cardiomyopathy, hypertension, and prior diabetic foot ulcers. He has had multiple issues with his hemodialysis access. He had a prior left radiocephalic fistula that failed. In October 2020, he had a left brachiobasilic fistula. He apparently underwent a procedure at an outside vascular office on November 09, 2021. Subsequent to that, he developed  a wound on his left posterior forearm just distal to the elbow as well as wounds on 2 of his left digits. He was diagnosed with steal syndrome and recently underwent ligation of his fistula with placement of a hemodialysis catheter. He is here today for assistance in healing the wounds that developed related to steal syndrome. He says that he has been applying Neosporin to the sites and they are fairly sensitive to the touch. 03/08/2022: The wounds on his fingers are much smaller and have just a little bit of accumulated thin eschar. The wound on his left posterior forearm is also smaller but has accumulated both slough and eschar. He also has a thick callus on his right fifth metatarsal head plantar surface and unfortunately, it appears that there is an open wound underneath the callus. We have been using Iodosorb with Hydrofera Blue to all of his open wounds. Pain is much improved from last week. 03/15/2022: The wounds on his fingers have healed. The wound on his left posterior forearm is smaller and has a nice layer of granulation tissue with just a bit of accumulated slough. He has built up additional callus around the right fifth metatarsal head plantar  surface, but the underlying wound has a good surface with granulation tissue present. We have been using Iodosorb with Hydrofera Blue. He did not endorse any pain this week. 03/22/2022: The wound on his left posterior forearm continues to contract. There is good granulation tissue present and just a little bit of periwound eschar and overlying slough. The right fifth metatarsal head wound has once again accumulated some callus and there is slough within the wound. The intake nurse measured a area of undermining from 7-10 o'clock. 03/29/2022: The patient saw podiatry this week for nail trim and they also pared back a number of calluses. The wound on his left posterior forearm is substantially smaller with just some overlying thin eschar. The right fifth metatarsal head has a bit of callus accumulation as well as some slough in the wound bed but is otherwise clean. It is smaller today, as well. 04/05/2022: The wound on his left posterior forearm is nearly closed with just a small amount of overlying eschar. The right fifth metatarsal head wound is smaller again this week. There is some periwound callus accumulation and some slough in the wound bed. He is going to get new dialysis access placed next week. 04/19/2022: The wound on his left posterior forearm is closed. The right fifth metatarsal head wound is about the same size with periwound callus and slough accumulation. He did have a new right forearm PTFE AV graft placed last week. His right forearm and hand are quite swollen, warm, and red today. He was apparently seen in the emergency room yesterday and they did not think it was infected and attributed the swelling to normal postop findings. 04/28/2022: The wound on his foot has accumulated a lot of periwound callus, but the actual open surface is quite small. There is just a little bit of slough on the surface. 05/03/2022: His wound is even smaller today with less periwound callus accumulation. He  continues to build up slough but the underlying tissue is healthy and viable-appearing. 05/10/2022: The orifice of the wound continues to contract, but there is some undermining and periwound callus formation. No significant accumulation of slough. 05/17/2022: The wound is about the same size today, perhaps slightly smaller. He does have periwound callus accumulation, but it does not overlap or create any undermining. There is  some slough in the base of the wound. 05/31/2022: The wound measured larger today on intake, but this may be secondary to the fairly aggressive debridement I performed at his last visit. Despite this, he has built up callus around the wound again. There is some epiboly around the orifice. There is slough within the wound and some undermining is present. His blood sugar remains very poorly controlled; it was 300 today. 06/16/2022: No real change in the size of his wound. He has callus accumulation around the perimeter. Last visit we changed his dressing to Prisma; I have not seen much in the way of filling in from the base yet. 06/30/2022: No real change to his wound. There is some undermining that has been created by accumulated callus. We are struggling with poor glucose control in the setting of immunodeficiency and end-stage renal disease on dialysis. 07/14/2022: The wound has gotten deeper and a probe placed into the wound can be palpated coming to just under the skin on the dorsal aspect of his foot. The wound itself is fairly clean and there is no malodor or purulent drainage present. He is scheduled to see podiatry next week and hopefully get x-rays performed at that visit. He is also requesting a knee scooter. Patient History Information obtained from Patient. Family History Diabetes - Siblings,Father, Heart Disease - Father,Siblings, Hypertension - Father,Siblings, No family history of Cancer, Hereditary Spherocytosis, Kidney Disease, Lung Disease, Seizures, Stroke,  Thyroid Problems, Tuberculosis. Social History Current every day smoker - 1/2 ppd / 39yr, Marital Status - Single, Alcohol Use - Moderate - 6 standard drinks per week, Drug Use - Prior History - Polysubstance Abuse; clean for 9 years, Caffeine Use - Rarely - tea. Medical History Eyes Patient has history of Cataracts - mild, Glaucoma Denies history of Optic Neuritis Ear/Nose/Mouth/Throat Denies history of Chronic sinus problems/congestion, Middle ear problems Hematologic/Lymphatic Patient has history of Anemia Denies history of Hemophilia, Human Immunodeficiency Virus, Lymphedema, Sickle Cell Disease Respiratory Patient has history of Chronic Obstructive Pulmonary Disease (COPD) Denies history of Aspiration, Asthma, Pneumothorax, Sleep Apnea, Tuberculosis Cardiovascular Patient has history of Congestive Heart Failure, Coronary Artery Disease, Hypertension, Myocardial Infarction - 2004 Denies history of Angina, Arrhythmia, Deep Vein Thrombosis, Hypotension, Peripheral Arterial Disease, Peripheral Venous Disease, Phlebitis, Vasculitis Gastrointestinal Patient has history of Hepatitis C - 1968 Denies history of Cirrhosis , Colitis, Crohnoos, Hepatitis A, Hepatitis B Endocrine Patient has history of Type II Diabetes Denies history of Type I Diabetes Genitourinary Denies history of End Stage Renal Disease Immunological Denies history of Lupus Erythematosus, Raynaudoos, Scleroderma Integumentary (Skin) Denies history of History of Burn Musculoskeletal Patient has history of Gout, Osteoarthritis Denies history of Rheumatoid Arthritis, Osteomyelitis Neurologic Patient has history of Neuropathy Denies history of Dementia, Quadriplegia, Paraplegia, Seizure Disorder Oncologic Denies history of Received Chemotherapy, Received Radiation Psychiatric Denies history of Anorexia/bulimia, Confinement Anxiety Hospitalization/Surgery History - Colonoscopy. - CABG. - Laproscopic  Cholecystectomy. - Left Hip Fx. - Left Jaw Fx. - Lumbar Laminectomy. Medical A Surgical History Notes nd Constitutional Symptoms (General Health) Insomnia Syncope Alcohol Dependence HIV Hematologic/Lymphatic Hyperlipidemia Hyperglycemia Cardiovascular CABG, ischemic cardiomyopathy Gastrointestinal GERD Genitourinary Syphilis CKD,Nocturia Immunological HIV Musculoskeletal Hip Fx BiLateral Bunions Unspecified Arthritis Neurologic chronic back pain Objective Constitutional No acute distress.. Vitals Time Taken: 12:41 PM, Height: 74 in, Weight: 172 lbs, BMI: 22.1, Temperature: 97.7 F, Pulse: 84 bpm, Respiratory Rate: 18 breaths/min, Blood Pressure: 124/65 mmHg, Capillary Blood Glucose: 224 mg/dl. General Notes: glucose per pt report this am Respiratory Normal work of breathing on  room air.. General Notes: 07/14/2022: The wound has gotten deeper and a probe placed into the wound can be palpated coming to just under the skin on the dorsal aspect of his foot. The wound itself is fairly clean and there is no malodor or purulent drainage present. Integumentary (Hair, Skin) Wound #8 status is Open. Original cause of wound was Blister. The date acquired was: 03/08/2022. The wound has been in treatment 18 weeks. The wound is located on the [place]. The wound measures 0.7cm length x 0.7cm width x 2.4cm depth; 0.385cm^2 area and 0.924cm^3 volume. There is tendon and Fat Layer (Subcutaneous Tissue) exposed. There is no tunneling noted, however, there is undermining starting at 12:00 and ending at 12:00 with a maximum distance of 1.6cm. There is a medium amount of serosanguineous drainage noted. The wound margin is thickened. There is large (67-100%) red, friable granulation within the wound bed. There is a small (1-33%) amount of necrotic tissue within the wound bed including Adherent Slough. Assessment Active Problems ICD-10 Non-pressure chronic ulcer of other part of right foot  with fat layer exposed Type 2 diabetes mellitus with diabetic chronic kidney disease Chronic obstructive pulmonary disease, unspecified Chronic combined systolic (congestive) and diastolic (congestive) heart failure Essential (primary) hypertension Secondary hyperparathyroidism of renal origin End stage renal disease Human immunodeficiency virus [HIV] disease Procedures Wound #8 Pre-procedure diagnosis of Wound #8 is a Diabetic Wound/Ulcer of the Lower Extremity located on the Right,Plantar Foot .Severity of Tissue Pre Debridement is: Fat layer exposed. There was a Excisional Skin/Subcutaneous Tissue Debridement with a total area of 0.49 sq cm performed by Fredirick Maudlin, MD. With the following instrument(s): Curette to remove Viable and Non-Viable tissue/material. Material removed includes Subcutaneous Tissue and Slough and after achieving pain control using Lidocaine 4% Topical Solution. No specimens were taken. A time out was conducted at 13:00, prior to the start of the procedure. A Minimum amount of bleeding was controlled with Pressure. The procedure was tolerated well with a pain level of 0 throughout and a pain level of 0 following the procedure. Post Debridement Measurements: 0.7cm length x 0.7cm width x 2.4cm depth; 0.924cm^3 volume. Character of Wound/Ulcer Post Debridement is improved. Severity of Tissue Post Debridement is: Fat layer exposed. Post procedure Diagnosis Wound #8: Same as Pre-Procedure Plan Follow-up Appointments: Return Appointment in 1 week. - Dr. Celine Ahr RM 1 with [name] Anesthetic: Wound #8 Right,Plantar Foot: (In clinic) Topical Lidocaine 4% applied to wound bed Bathing/ Shower/ Hygiene: Other Bathing/Shower/Hygiene Orders/Instructions: - Change dressing after bathing Off-Loading: Other: - knee scooter for right leg, wear custom diabetic shoes at all times when walking, minimal weight bearing right foot The following medication(s) was prescribed: gentamicin  topical 0.1 % ointment Apply liberally to packing strips for use with dressing changes starting 07/14/2022 WOUND #8: - Foot Wound Laterality: Plantar, Right Cleanser: Soap and Water Every Other Day/15 Days Discharge Instructions: May shower and wash wound with dial antibacterial soap and water prior to dressing change. Cleanser: Wound Cleanser (Generic) Every Other Day/15 Days Discharge Instructions: Cleanse the wound with wound cleanser prior to applying a clean dressing using gauze sponges, not tissue or cotton balls. Topical: Gentamicin Every Other Day/15 Days Discharge Instructions: apply to packing strip with dressing changes Prim Dressing: Iodoform packing strip 1/2 (in) Every Other Day/15 Days ary Discharge Instructions: Lightly pack as instructed Secondary Dressing: Optifoam Non-Adhesive Dressing, 4x4 in Every Other Day/15 Days Discharge Instructions: Apply over primary dressing cut to make foam donut Secondary Dressing: Woven Gauze Sponges  2x2 in Every Other Day/15 Days Discharge Instructions: Apply over primary dressing as directed. Secured With: 77M Medipore H Soft Cloth Surgical T ape, 4 x 10 (in/yd) Every Other Day/15 Days Discharge Instructions: Secure with tape as directed. 07/14/2022: The wound has gotten deeper and a probe placed into the wound can be palpated coming to just under the skin on the dorsal aspect of his foot. The wound itself is fairly clean and there is no malodor or purulent drainage present. I used a curette to debride slough and nonviable subcutaneous tissue from the wound. I am going to have him start packing the wound with gentamicin- impregnated iodoform packing strips; I am quite concerned that he may have osteomyelitis. I will await the results of his x-ray to be done next week. I would like to see the patient in 1 week's time, rather than 2. Electronic Signature(s) Signed: 07/18/2022 9:47:33 AM By: Fredirick Maudlin MD FACS Signed: 07/18/2022 5:20:46 PM By:  Baruch Gouty RN, BSN Previous Signature: 07/14/2022 1:10:08 PM Version By: Fredirick Maudlin MD FACS Entered By: Baruch Gouty on 07/18/2022 09:02:08 -------------------------------------------------------------------------------- HxROS Details Patient Name: Date of Service: Jacob Ache. 07/14/2022 12:30 PM Medical Record Number: [id number] Patient Account Number: [id number] Date of Birth/Sex: Treating RN: [birth year] (71 y.o. M) Primary Care Provider: Alain Honey Other Clinician: Referring Provider: Treating Provider/Extender: Neil Crouch in Treatment: 19 Information Obtained From Patient Constitutional Symptoms (General Health) Medical History: Past Medical History Notes: Insomnia Syncope Alcohol Dependence HIV Eyes Medical History: Positive for: Cataracts - mild; Glaucoma Negative for: Optic Neuritis Ear/Nose/Mouth/Throat Medical History: Negative for: Chronic sinus problems/congestion; Middle ear problems Hematologic/Lymphatic Medical History: Positive for: Anemia Negative for: Hemophilia; Human Immunodeficiency Virus; Lymphedema; Sickle Cell Disease Past Medical History Notes: Hyperlipidemia Hyperglycemia Respiratory Medical History: Positive for: Chronic Obstructive Pulmonary Disease (COPD) Negative for: Aspiration; Asthma; Pneumothorax; Sleep Apnea; Tuberculosis Cardiovascular Medical History: Positive for: Congestive Heart Failure; Coronary Artery Disease; Hypertension; Myocardial Infarction - 2004 Negative for: Angina; Arrhythmia; Deep Vein Thrombosis; Hypotension; Peripheral Arterial Disease; Peripheral Venous Disease; Phlebitis; Vasculitis Past Medical History Notes: CABG, ischemic cardiomyopathy Gastrointestinal Medical History: Positive for: Hepatitis C - 1968 Negative for: Cirrhosis ; Colitis; Crohns; Hepatitis A; Hepatitis B Past Medical History Notes: GERD Endocrine Medical History: Positive for: Type II  Diabetes Negative for: Type I Diabetes Time with diabetes: 13 yrs Treated with: Insulin Blood sugar tested every day: Yes Tested : 2 times per day Blood sugar testing results: Breakfast: 100; Dinner: 95-126 Genitourinary Medical History: Negative for: End Stage Renal Disease Past Medical History Notes: Syphilis CKD,Nocturia Immunological Medical History: Negative for: Lupus Erythematosus; Raynauds; Scleroderma Past Medical History Notes: HIV Integumentary (Skin) Medical History: Negative for: History of Burn Musculoskeletal Medical History: Positive for: Gout; Osteoarthritis Negative for: Rheumatoid Arthritis; Osteomyelitis Past Medical History Notes: Hip Fx BiLateral Bunions Unspecified Arthritis Neurologic Medical History: Positive for: Neuropathy Negative for: Dementia; Quadriplegia; Paraplegia; Seizure Disorder Past Medical History Notes: chronic back pain Oncologic Medical History: Negative for: Received Chemotherapy; Received Radiation Psychiatric Medical History: Negative for: Anorexia/bulimia; Confinement Anxiety HBO Extended History Items Eyes: Eyes: Cataracts Glaucoma Immunizations Pneumococcal Vaccine: Received Pneumococcal Vaccination: Yes Received Pneumococcal Vaccination On or After 60th Birthday: Yes Implantable Devices No devices added Hospitalization / Surgery History Type of Hospitalization/Surgery Colonoscopy CABG Laproscopic Cholecystectomy Left Hip Fx Left Jaw Fx Lumbar Laminectomy Family and Social History Cancer: No; Diabetes: Yes - Siblings,Father; Heart Disease: Yes - Father,Siblings; Hereditary Spherocytosis: No; Hypertension: Yes - Father,Siblings; Kidney Disease: No; Lung  Disease: No; Seizures: No; Stroke: No; Thyroid Problems: No; Tuberculosis: No; Current every day smoker - 1/2 ppd / 62yr; Marital Status - Single; Alcohol Use: Moderate - 6 standard drinks per week; Drug Use: Prior History - Polysubstance Abuse; clean for 9  years; Caffeine Use: Rarely - tea; Financial Concerns: No; Food, Clothing or Shelter Needs: No; Support System Lacking: No; Transportation Concerns: No Electronic Signature(s) Signed: 07/14/2022 3:00:56 PM By: CFredirick MaudlinMD FACS Entered By: CFredirick Maudlinon 07/14/2022 13:07:06 -------------------------------------------------------------------------------- SuperBill Details Patient Name: Date of Service: NRaechel Ache9/21/2023 Medical Record Number: [id number] Account Number: [id number] of Birth/Sex: Treating RN: 403/01/1951(71y.o. 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 E11.22 Type 2 diabetes mellitus with diabetic chronic kidney disease J44.9 Chronic obstructive pulmonary disease, unspecified I50.42 Chronic combined systolic (congestive) and diastolic (congestive) heart failure I10 Essential (primary) hypertension N25.81 Secondary hyperparathyroidism of renal origin N18.6 End stage renal disease B20 Human immunodeficiency virus [HIV] disease Facility Procedures CPT4 Code: [id number]: 1[place]- DEB SUBQ TISSUE 20 SQ CM/< ICD-10 Diagnosis Description L97.512 Non-pressure chronic ulcer of other part of right foot with fat layer exposed Modifier: Quantity: 1 Physician Procedures : CPT4 Code Description Modifier [id number] 93903- WC PHYS LEVEL 4 - EST PT 25 ICD-10 Diagnosis Description L97.512 Non-pressure chronic ulcer of other part of right foot with fat layer exposed E11.22 Type 2 diabetes mellitus with diabetic chronic kidney  disease B20 Human immunodeficiency virus [HIV] disease N18.6 End stage renal disease Quantity: 1 : [id number] 07622- WC PHYS SUBQ TISS 20 SQ CM ICD-10 Diagnosis  Description L97.512 Non-pressure chronic ulcer of other part of right foot with fat layer exposed Quantity: 1 Electronic Signature(s) Signed: 07/14/2022 1:10:28 PM By: CFredirick MaudlinMD FACS Entered By: CFredirick Maudlinon 07/14/2022 13:10:28

## 2022-07-14 NOTE — Progress Notes (Signed)
Jacob [name], Jacob [name] ([id number]) Visit Report for 07/14/2022 Arrival Information Details Patient Name: Date of Service: Jacob [name], Jacob [name] 07/14/2022 12:30 PM Medical Record Number: [id number] Patient Account Number: [id number] Date of Birth/Sex: Treating RN: 07-Sep-1951 (71 y.o. [name] Primary Care Olman Yono: Alain Honey Other Clinician: Referring Domnique Vanegas: Treating Jaxx Huish/Extender: Neil Crouch in Treatment: 82 Visit Information History Since Last Visit Added or deleted any medications: No Patient Arrived: Jacob [name] Any new allergies or adverse reactions: No Arrival Time: 12:39 Had a fall or experienced change in No Accompanied By: self activities of daily living that may affect Transfer Assistance: None risk of falls: Patient Identification Verified: Yes Signs or symptoms of abuse/neglect since last visito No Secondary Verification Process Completed: Yes Hospitalized since last visit: No Patient Requires Transmission-Based Precautions: No Implantable device outside of the clinic excluding No Patient Has Alerts: No cellular tissue based products placed in the center since last visit: Has Dressing in Place as Prescribed: Yes Has Footwear/Offloading in Place as Prescribed: Yes Right: Other:diabetic shoe Pain Present Now: Yes Electronic Signature(s) Signed: 07/14/2022 5:15:24 PM By: Baruch Gouty RN, BSN Entered By: Baruch Gouty on 07/14/2022 12:41:44 -------------------------------------------------------------------------------- Encounter Discharge Information Details Patient Name: Date of Service: Jacob Ache. 07/14/2022 12:30 PM Medical Record Number: [id number] Patient Account Number: [id number] Date of Birth/Sex: Treating RN: [birth year] (71 y.o. [name] Primary Care Talani Brazee: Alain Honey Other Clinician: Referring Shelda Truby: Treating Lonya Johannesen/Extender: Neil Crouch in Treatment:  19 Encounter Discharge Information Items Post Procedure Vitals Discharge Condition: Stable Temperature (F): 97.7 Ambulatory Status: Cane Pulse (bpm): 84 Discharge Destination: Home Respiratory Rate (breaths/min): 18 Transportation: Other Blood Pressure (mmHg): 124/65 Accompanied By: self Schedule Follow-up Appointment: Yes Clinical Summary of Care: Patient Declined Notes SCAT Electronic Signature(s) Signed: 07/14/2022 5:15:24 PM By: Baruch Gouty RN, BSN Entered By: Baruch Gouty on 07/14/2022 13:24:47 -------------------------------------------------------------------------------- Lower Extremity Assessment Details Patient Name: Date of Service: Jacob Ache. 07/14/2022 12:30 PM Medical Record Number: [id number] Patient Account Number: [id number] Date of Birth/Sex: Treating RN: 1951/05/18 (71 y.o. [name] Primary Care Calysta Craigo: Alain Honey Other Clinician: Referring Signa Cheek: Treating Safi Culotta/Extender: Neil Crouch in Treatment: 19 Edema Assessment Assessed: [name]: No] Patrice Paradise: No] Edema: [Left: N] [Right: o] Calf Left: Right: Point of Measurement: From Medial Instep 32.8 cm Ankle Left: Right: Point of Measurement: From Medial Instep 21 cm Vascular Assessment Pulses: Dorsalis Pedis Palpable: [Right:Yes] Electronic Signature(s) Signed: 07/14/2022 5:15:24 PM By: Baruch Gouty RN, BSN Entered By: Baruch Gouty on 07/14/2022 12:45:28 -------------------------------------------------------------------------------- Multi Wound Chart Details Patient Name: Date of Service: Jacob Ache. 07/14/2022 12:30 PM Medical Record Number: [id number] Patient Account Number: [id number] Date of Birth/Sex: Treating RN: 06-14-1951 (71 y.o. M) Primary Care Maisey Deandrade: Alain Honey Other Clinician: Referring Marquies Wanat: Treating Zyionna Pesce/Extender: Neil Crouch in Treatment: 19 Vital  Signs Height(in): 74 Capillary Blood Glucose(mg/dl): 224 Weight(lbs): 172 Pulse(bpm): 67 Body Mass Index(BMI): 22.1 Blood Pressure(mmHg): 124/65 Temperature(F): 97.7 Respiratory Rate(breaths/min): 18 Photos: [8:Right, Plantar Foot] [N/A:N/A N/A] Wound Location: [8:Blister] [N/A:N/A] Wounding Event: [8:Diabetic Wound/Ulcer of the Lower] [N/A:N/A] Primary Etiology: [8:Extremity Cataracts, Glaucoma, Anemia, Chronic N/A] Comorbid History: [8:Obstructive Pulmonary Disease (COPD), Congestive Heart Failure, Coronary Artery Disease, Hypertension, Myocardial Infarction, Hepatitis C, Type II Diabetes, Gout, Osteoarthritis, Neuropathy 03/08/2022] [N/A:N/A] Date Acquired: [8:18] [N/A:N/A] Weeks of Treatment: [8:Open] [N/A:N/A] Wound Status: [8:No] [N/A:N/A] Wound Recurrence: [8:0.7x0.7x2.4] [N/A:N/A] Measurements L x W x D (cm) [8:0.385] [N/A:N/A] A (cm) :  rea [8:0.924] [N/A:N/A] Volume (cm) : [8:65.00%] [N/A:N/A] % Reduction in A [8:rea: -180.00%] [N/A:N/A] % Reduction in Volume: [8:12] Starting Position 1 (o'clock): [8:12] Ending Position 1 (o'clock): [8:1.6] Maximum Distance 1 (cm): [8:Yes] [N/A:N/A] Undermining: [8:Grade 2] [N/A:N/A] Classification: [8:Medium] [N/A:N/A] Exudate A mount: [8:Serosanguineous] [N/A:N/A] Exudate Type: [8:red, brown] [N/A:N/A] Exudate Color: [8:Thickened] [N/A:N/A] Wound Margin: [8:Large (67-100%)] [N/A:N/A] Granulation A mount: [8:Red, Friable] [N/A:N/A] Granulation Quality: [8:Small (1-33%)] [N/A:N/A] Necrotic A mount: [8:Fat Layer (Subcutaneous Tissue): Yes N/A] Exposed Structures: [8:Tendon: Yes Fascia: No Muscle: No Joint: No Bone: No Small (1-33%)] [N/A:N/A] Epithelialization: [8:Debridement - Excisional] [N/A:N/A] Debridement: Pre-procedure Verification/Time Out 13:00 [N/A:N/A] Taken: [8:Lidocaine 4% Topical Solution] [N/A:N/A] Pain Control: [8:Subcutaneous, Slough] [N/A:N/A] Tissue Debrided: [8:Skin/Subcutaneous Tissue] [N/A:N/A] Level:  [8:0.49] [N/A:N/A] Debridement A (sq cm): [8:rea Curette] [N/A:N/A] Instrument: [8:Minimum] [N/A:N/A] Bleeding: [8:Pressure] [N/A:N/A] Hemostasis A chieved: [8:0] [N/A:N/A] Procedural Pain: [8:0] [N/A:N/A] Post Procedural Pain: [8:Procedure was tolerated well] [N/A:N/A] Debridement Treatment Response: [8:0.7x0.7x2.4] [N/A:N/A] Post Debridement Measurements L x W x D (cm) [8:0.924] [N/A:N/A] Post Debridement Volume: (cm) [8:Debridement] [N/A:N/A] Treatment Notes Electronic Signature(s) Signed: 07/14/2022 1:05:33 PM By: Fredirick Maudlin MD FACS Entered By: Fredirick Maudlin on 07/14/2022 13:05:32 -------------------------------------------------------------------------------- Multi-Disciplinary Care Plan Details Patient Name: Date of Service: Jacob Ache. 07/14/2022 12:30 PM Medical Record Number: [id number] Patient Account Number: [id number] Date of Birth/Sex: Treating RN: 1951/07/27 (71 y.o. [name] Primary Care Sohum Delillo: Alain Honey Other Clinician: Referring Liora Myles: Treating Shadi Sessler/Extender: Neil Crouch in Treatment: [name] reviewed with physician Active Inactive Nutrition Nursing Diagnoses: Impaired glucose control: actual or potential Potential for alteratiion in Nutrition/Potential for imbalanced nutrition Goals: Patient/caregiver will maintain therapeutic glucose control Date Initiated: 05/10/2022 Target Resolution Date: 08/11/2022 Goal Status: Active Interventions: Assess HgA1c results as ordered upon admission and as needed Provide education on elevated blood sugars and impact on wound healing Treatment Activities: Dietary management education, guidance and counseling : 05/10/2022 Patient referred to Primary Care Physician for further nutritional evaluation : 05/10/2022 Notes: Wound/Skin Impairment Nursing Diagnoses: Impaired tissue integrity Goals: Patient/caregiver will verbalize  understanding of skin care regimen Date Initiated: 03/01/2022 Target Resolution Date: 08/11/2022 Goal Status: Active Interventions: Assess ulceration(s) every visit Treatment Activities: Skin care regimen initiated : 03/01/2022 Notes: Electronic Signature(s) Signed: 07/14/2022 5:15:24 PM By: Baruch Gouty RN, BSN Entered By: Baruch Gouty on 07/14/2022 12:56:23 -------------------------------------------------------------------------------- Pain Assessment Details Patient Name: Date of Service: Jacob Ache. 07/14/2022 12:30 PM Medical Record Number: [id number] Patient Account Number: [id number] Date of Birth/Sex: Treating RN: 06-28-1951 (71 y.o. [name] Primary Care Vinie Charity: Alain Honey Other Clinician: Referring Jackalyn Haith: Treating [name] [name]/Extender: Neil Crouch in Treatment: 19 Active Problems Location of Pain Severity and Description of Pain Patient Has Paino Yes Site Locations Pain Location: Pain Location: Generalized Pain Duration of the Pain. Constant / Intermittento Intermittent Rate the pain. Current Pain Level: 4 Least Pain Level: 0 Character of Pain Describe the Pain: Burning Pain Management and Medication Current Pain Management: Medication: Yes Is the Current Pain Management Adequate: Adequate How does your wound impact your activities of daily livingo Sleep: No Bathing: No Appetite: No Relationship With Others: No Bladder Continence: No Emotions: No Bowel Continence: No Work: No Toileting: No Drive: No Dressing: No Hobbies: No Notes reports right ankle burning Electronic Signature(s) Signed: 07/14/2022 5:15:24 PM By: Baruch Gouty RN, BSN Entered By: Baruch Gouty on 07/14/2022 12:43:53 -------------------------------------------------------------------------------- Patient/Caregiver Education Details Patient Name: Date of Service: Jacob Ache 9/21/2023andnbsp12:30 PM Medical  Record  Number: [id number] Patient Account Number: [id number] Date of Birth/Gender: Treating RN: 1951/02/09 (71 y.o. [name] Primary Care Physician: Alain Honey Other Clinician: Referring Physician: Treating Physician/Extender: Neil Crouch in Treatment: 19 Education Assessment Education Provided To: Patient Education Topics Provided Elevated Blood Sugar/ Impact on Healing: Methods: Explain/Verbal Responses: Reinforcements needed, State content correctly Offloading: Methods: Explain/Verbal Responses: Reinforcements needed, State content correctly Wound/Skin Impairment: Methods: Explain/Verbal Responses: Reinforcements needed, State content correctly Electronic Signature(s) Signed: 07/14/2022 5:15:24 PM By: Baruch Gouty RN, BSN Entered By: Baruch Gouty on 07/14/2022 12:56:51 -------------------------------------------------------------------------------- Wound Assessment Details Patient Name: Date of Service: Jacob Ache. 07/14/2022 12:30 PM Medical Record Number: [id number] Patient Account Number: [id number] Date of Birth/Sex: Treating RN: 12-04-1950 (71 y.o. [name] Primary Care Jamyah Folk: Alain Honey Other Clinician: Referring Detravion Tester: Treating Sophira Rumler/Extender: Neil Crouch in Treatment: 19 Wound Status Wound Number: 8 Primary Diabetic Wound/Ulcer of the Lower Extremity Etiology: Wound Location: Right, Plantar Foot Wound Open Wounding Event: Blister Status: Date Acquired: 03/08/2022 Comorbid Cataracts, Glaucoma, Anemia, Chronic Obstructive Pulmonary Weeks Of Treatment: 18 History: Disease (COPD), Congestive Heart Failure, Coronary Artery Clustered Wound: No Disease, Hypertension, Myocardial Infarction, Hepatitis C, Type II Diabetes, Gout, Osteoarthritis, Neuropathy Photos Wound Measurements Length: (cm) 0.7 Width: (cm) 0.7 Depth: (cm) 2.4 Area: (cm) 0.385 Volume: (cm)  0.924 % Reduction in Area: 65% % Reduction in Volume: -180% Epithelialization: Small (1-33%) Tunneling: No Undermining: Yes Starting Position (o'clock): 12 Ending Position (o'clock): 12 Maximum Distance: (cm) 1.6 Wound Description Classification: Grade 2 Wound Margin: Thickened Exudate Amount: Medium Exudate Type: Serosanguineous Exudate Color: red, brown Foul Odor After Cleansing: No Slough/Fibrino Yes Wound Bed Granulation Amount: Large (67-100%) Exposed Structure Granulation Quality: Red, Friable Fascia Exposed: No Necrotic Amount: Small (1-33%) Fat Layer (Subcutaneous Tissue) Exposed: Yes Necrotic Quality: Adherent Slough Tendon Exposed: Yes Muscle Exposed: No Joint Exposed: No Bone Exposed: No Treatment Notes Wound #8 (Foot) Wound Laterality: Plantar, Right Cleanser Soap and Water Discharge Instruction: May shower and wash wound with dial antibacterial soap and water prior to dressing change. Wound Cleanser Discharge Instruction: Cleanse the wound with wound cleanser prior to applying a clean dressing using gauze sponges, not tissue or cotton balls. Peri-Wound Care Topical Gentamicin Discharge Instruction: apply to packing strip with dressing changes Primary Dressing Iodoform packing strip 1/2 (in) Discharge Instruction: Lightly pack as instructed Secondary Dressing Optifoam Non-Adhesive Dressing, 4x4 in Discharge Instruction: Apply over primary dressing cut to make foam donut Woven Gauze Sponges 2x2 in Discharge Instruction: Apply over primary dressing as directed. Secured With 8M Medipore H Soft Cloth Surgical T ape, 4 x 10 (in/yd) Discharge Instruction: Secure with tape as directed. Compression Wrap Compression Stockings Add-Ons Electronic Signature(s) Signed: 07/14/2022 5:15:24 PM By: Baruch Gouty RN, BSN Entered By: Baruch Gouty on 07/14/2022 12:53:08 -------------------------------------------------------------------------------- Vitals  Details Patient Name: Date of Service: Jacob Ache. 07/14/2022 12:30 PM Medical Record Number: [id number] Patient Account Number: [id number] Date of Birth/Sex: Treating RN: 1951/01/16 (71 y.o. [name] Primary Care Oliviya Gilkison: Alain Honey Other Clinician: Referring Raylon Lamson: Treating Aarushi Hemric/Extender: Neil Crouch in Treatment: 19 Vital Signs Time Taken: 12:41 Temperature (F): 97.7 Height (in): 74 Pulse (bpm): 84 Weight (lbs): 172 Respiratory Rate (breaths/min): 18 Body Mass Index (BMI): 22.1 Blood Pressure (mmHg): 124/65 Capillary Blood Glucose (mg/dl): 224 Reference Range: 80 - 120 mg / dl Notes glucose per pt report this am Electronic Signature(s) Signed: 07/14/2022 5:15:24 PM By: Johna Roles,  Jacques Navy, BSN Entered By: Baruch Gouty on 07/14/2022 12:43:01

## 2022-07-16 ENCOUNTER — Telehealth: Payer: Self-pay | Admitting: Orthopedic Surgery

## 2022-07-16 NOTE — Telephone Encounter (Signed)
Patient was seen by wound care 09/21- he was told he qualifies for scooter. He called oncall provider asking about scooter. I do not see any notes or orders in relation this request. Advised to call during office hours.

## 2022-07-18 ENCOUNTER — Inpatient Hospital Stay (HOSPITAL_COMMUNITY)
Admission: EM | Admit: 2022-07-18 | Discharge: 2022-07-22 | DRG: 623 | Disposition: A | Discharge status: Home / Self Care | Payer: Medicare Other | Attending: Family Medicine | Admitting: Family Medicine

## 2022-07-18 ENCOUNTER — Other Ambulatory Visit: Payer: Self-pay

## 2022-07-18 ENCOUNTER — Other Ambulatory Visit (HOSPITAL_COMMUNITY): Payer: Self-pay

## 2022-07-18 ENCOUNTER — Inpatient Hospital Stay (HOSPITAL_COMMUNITY): Payer: Medicare Other

## 2022-07-18 ENCOUNTER — Emergency Department (HOSPITAL_COMMUNITY): Payer: Medicare Other

## 2022-07-18 ENCOUNTER — Encounter (HOSPITAL_COMMUNITY): Payer: Self-pay

## 2022-07-18 DIAGNOSIS — M86671 Other chronic osteomyelitis, right ankle and foot: Secondary | ICD-10-CM | POA: Diagnosis present

## 2022-07-18 DIAGNOSIS — M19071 Primary osteoarthritis, right ankle and foot: Secondary | ICD-10-CM | POA: Diagnosis not present

## 2022-07-18 DIAGNOSIS — M869 Osteomyelitis, unspecified: Secondary | ICD-10-CM | POA: Diagnosis not present

## 2022-07-18 DIAGNOSIS — I2510 Atherosclerotic heart disease of native coronary artery without angina pectoris: Secondary | ICD-10-CM | POA: Diagnosis present

## 2022-07-18 DIAGNOSIS — E1140 Type 2 diabetes mellitus with diabetic neuropathy, unspecified: Secondary | ICD-10-CM | POA: Diagnosis present

## 2022-07-18 DIAGNOSIS — S91301A Unspecified open wound, right foot, initial encounter: Secondary | ICD-10-CM | POA: Diagnosis not present

## 2022-07-18 DIAGNOSIS — M2011 Hallux valgus (acquired), right foot: Secondary | ICD-10-CM | POA: Diagnosis not present

## 2022-07-18 DIAGNOSIS — E1165 Type 2 diabetes mellitus with hyperglycemia: Secondary | ICD-10-CM | POA: Diagnosis not present

## 2022-07-18 DIAGNOSIS — M109 Gout, unspecified: Secondary | ICD-10-CM | POA: Diagnosis present

## 2022-07-18 DIAGNOSIS — N186 End stage renal disease: Secondary | ICD-10-CM

## 2022-07-18 DIAGNOSIS — L97519 Non-pressure chronic ulcer of other part of right foot with unspecified severity: Secondary | ICD-10-CM | POA: Diagnosis not present

## 2022-07-18 DIAGNOSIS — M868X7 Other osteomyelitis, ankle and foot: Secondary | ICD-10-CM | POA: Diagnosis not present

## 2022-07-18 DIAGNOSIS — N2581 Secondary hyperparathyroidism of renal origin: Secondary | ICD-10-CM | POA: Diagnosis not present

## 2022-07-18 DIAGNOSIS — M898X9 Other specified disorders of bone, unspecified site: Secondary | ICD-10-CM | POA: Diagnosis present

## 2022-07-18 DIAGNOSIS — E1169 Type 2 diabetes mellitus with other specified complication: Secondary | ICD-10-CM | POA: Diagnosis not present

## 2022-07-18 DIAGNOSIS — R9431 Abnormal electrocardiogram [ECG] [EKG]: Secondary | ICD-10-CM | POA: Diagnosis not present

## 2022-07-18 DIAGNOSIS — E875 Hyperkalemia: Secondary | ICD-10-CM

## 2022-07-18 DIAGNOSIS — I252 Old myocardial infarction: Secondary | ICD-10-CM

## 2022-07-18 DIAGNOSIS — E119 Type 2 diabetes mellitus without complications: Secondary | ICD-10-CM | POA: Diagnosis not present

## 2022-07-18 DIAGNOSIS — E11622 Type 2 diabetes mellitus with other skin ulcer: Secondary | ICD-10-CM | POA: Diagnosis present

## 2022-07-18 DIAGNOSIS — Z9049 Acquired absence of other specified parts of digestive tract: Secondary | ICD-10-CM

## 2022-07-18 DIAGNOSIS — Z955 Presence of coronary angioplasty implant and graft: Secondary | ICD-10-CM

## 2022-07-18 DIAGNOSIS — E8779 Other fluid overload: Secondary | ICD-10-CM | POA: Diagnosis not present

## 2022-07-18 DIAGNOSIS — Z743 Need for continuous supervision: Secondary | ICD-10-CM | POA: Diagnosis not present

## 2022-07-18 DIAGNOSIS — Z8249 Family history of ischemic heart disease and other diseases of the circulatory system: Secondary | ICD-10-CM

## 2022-07-18 DIAGNOSIS — Z794 Long term (current) use of insulin: Secondary | ICD-10-CM | POA: Diagnosis not present

## 2022-07-18 DIAGNOSIS — R69 Illness, unspecified: Secondary | ICD-10-CM | POA: Diagnosis not present

## 2022-07-18 DIAGNOSIS — I255 Ischemic cardiomyopathy: Secondary | ICD-10-CM | POA: Diagnosis present

## 2022-07-18 DIAGNOSIS — J449 Chronic obstructive pulmonary disease, unspecified: Secondary | ICD-10-CM | POA: Diagnosis not present

## 2022-07-18 DIAGNOSIS — Z992 Dependence on renal dialysis: Secondary | ICD-10-CM

## 2022-07-18 DIAGNOSIS — E1122 Type 2 diabetes mellitus with diabetic chronic kidney disease: Secondary | ICD-10-CM | POA: Diagnosis present

## 2022-07-18 DIAGNOSIS — M84478A Pathological fracture, left toe(s), initial encounter for fracture: Secondary | ICD-10-CM | POA: Diagnosis not present

## 2022-07-18 DIAGNOSIS — D631 Anemia in chronic kidney disease: Secondary | ICD-10-CM | POA: Diagnosis not present

## 2022-07-18 DIAGNOSIS — E11621 Type 2 diabetes mellitus with foot ulcer: Secondary | ICD-10-CM | POA: Diagnosis present

## 2022-07-18 DIAGNOSIS — H409 Unspecified glaucoma: Secondary | ICD-10-CM | POA: Diagnosis present

## 2022-07-18 DIAGNOSIS — L97509 Non-pressure chronic ulcer of other part of unspecified foot with unspecified severity: Secondary | ICD-10-CM | POA: Diagnosis present

## 2022-07-18 DIAGNOSIS — Z79899 Other long term (current) drug therapy: Secondary | ICD-10-CM

## 2022-07-18 DIAGNOSIS — Z833 Family history of diabetes mellitus: Secondary | ICD-10-CM

## 2022-07-18 DIAGNOSIS — I4510 Unspecified right bundle-branch block: Secondary | ICD-10-CM | POA: Diagnosis not present

## 2022-07-18 DIAGNOSIS — I132 Hypertensive heart and chronic kidney disease with heart failure and with stage 5 chronic kidney disease, or end stage renal disease: Secondary | ICD-10-CM | POA: Diagnosis not present

## 2022-07-18 DIAGNOSIS — M86071 Acute hematogenous osteomyelitis, right ankle and foot: Secondary | ICD-10-CM

## 2022-07-18 DIAGNOSIS — E785 Hyperlipidemia, unspecified: Secondary | ICD-10-CM | POA: Diagnosis present

## 2022-07-18 DIAGNOSIS — F17210 Nicotine dependence, cigarettes, uncomplicated: Secondary | ICD-10-CM | POA: Diagnosis present

## 2022-07-18 DIAGNOSIS — I5040 Unspecified combined systolic (congestive) and diastolic (congestive) heart failure: Secondary | ICD-10-CM | POA: Diagnosis not present

## 2022-07-18 DIAGNOSIS — L089 Local infection of the skin and subcutaneous tissue, unspecified: Secondary | ICD-10-CM | POA: Diagnosis present

## 2022-07-18 DIAGNOSIS — N250 Renal osteodystrophy: Secondary | ICD-10-CM | POA: Diagnosis not present

## 2022-07-18 DIAGNOSIS — K219 Gastro-esophageal reflux disease without esophagitis: Secondary | ICD-10-CM | POA: Diagnosis present

## 2022-07-18 DIAGNOSIS — B20 Human immunodeficiency virus [HIV] disease: Secondary | ICD-10-CM | POA: Diagnosis present

## 2022-07-18 DIAGNOSIS — R410 Disorientation, unspecified: Secondary | ICD-10-CM | POA: Diagnosis not present

## 2022-07-18 DIAGNOSIS — M545 Low back pain, unspecified: Secondary | ICD-10-CM | POA: Diagnosis present

## 2022-07-18 DIAGNOSIS — M7989 Other specified soft tissue disorders: Secondary | ICD-10-CM | POA: Diagnosis not present

## 2022-07-18 DIAGNOSIS — Z91158 Patient's noncompliance with renal dialysis for other reason: Secondary | ICD-10-CM

## 2022-07-18 DIAGNOSIS — M1990 Unspecified osteoarthritis, unspecified site: Secondary | ICD-10-CM | POA: Diagnosis present

## 2022-07-18 DIAGNOSIS — D638 Anemia in other chronic diseases classified elsewhere: Secondary | ICD-10-CM | POA: Diagnosis present

## 2022-07-18 DIAGNOSIS — I10 Essential (primary) hypertension: Secondary | ICD-10-CM | POA: Diagnosis present

## 2022-07-18 DIAGNOSIS — G8929 Other chronic pain: Secondary | ICD-10-CM | POA: Diagnosis present

## 2022-07-18 DIAGNOSIS — L97514 Non-pressure chronic ulcer of other part of right foot with necrosis of bone: Secondary | ICD-10-CM | POA: Diagnosis not present

## 2022-07-18 DIAGNOSIS — I5042 Chronic combined systolic (congestive) and diastolic (congestive) heart failure: Secondary | ICD-10-CM | POA: Diagnosis not present

## 2022-07-18 DIAGNOSIS — Z885 Allergy status to narcotic agent status: Secondary | ICD-10-CM

## 2022-07-18 DIAGNOSIS — R0602 Shortness of breath: Secondary | ICD-10-CM | POA: Diagnosis not present

## 2022-07-18 DIAGNOSIS — L97909 Non-pressure chronic ulcer of unspecified part of unspecified lower leg with unspecified severity: Secondary | ICD-10-CM | POA: Diagnosis not present

## 2022-07-18 DIAGNOSIS — Q2730 Arteriovenous malformation, site unspecified: Secondary | ICD-10-CM

## 2022-07-18 DIAGNOSIS — Z888 Allergy status to other drugs, medicaments and biological substances status: Secondary | ICD-10-CM

## 2022-07-18 DIAGNOSIS — Z881 Allergy status to other antibiotic agents status: Secondary | ICD-10-CM

## 2022-07-18 DIAGNOSIS — J811 Chronic pulmonary edema: Secondary | ICD-10-CM | POA: Diagnosis not present

## 2022-07-18 DIAGNOSIS — E1151 Type 2 diabetes mellitus with diabetic peripheral angiopathy without gangrene: Secondary | ICD-10-CM | POA: Diagnosis present

## 2022-07-18 DIAGNOSIS — Z813 Family history of other psychoactive substance abuse and dependence: Secondary | ICD-10-CM

## 2022-07-18 DIAGNOSIS — G894 Chronic pain syndrome: Secondary | ICD-10-CM | POA: Diagnosis not present

## 2022-07-18 LAB — LIPASE, BLOOD: Lipase: 31 U/L (ref 11–51)

## 2022-07-18 LAB — CBC WITH DIFFERENTIAL/PLATELET
Abs Immature Granulocytes: 0.02 10*3/uL (ref 0.00–0.07)
Basophils Absolute: 0.1 10*3/uL (ref 0.0–0.1)
Basophils Relative: 1 %
Eosinophils Absolute: 0.1 10*3/uL (ref 0.0–0.5)
Eosinophils Relative: 2 %
HCT: 29.9 % — ABNORMAL LOW (ref 39.0–52.0)
Hemoglobin: 9.5 g/dL — ABNORMAL LOW (ref 13.0–17.0)
Immature Granulocytes: 0 %
Lymphocytes Relative: 28 %
Lymphs Abs: 2.1 10*3/uL (ref 0.7–4.0)
MCH: 33.9 pg (ref 26.0–34.0)
MCHC: 31.8 g/dL (ref 30.0–36.0)
MCV: 106.8 fL — ABNORMAL HIGH (ref 80.0–100.0)
Monocytes Absolute: 0.9 10*3/uL (ref 0.1–1.0)
Monocytes Relative: 13 %
Neutro Abs: 4.2 10*3/uL (ref 1.7–7.7)
Neutrophils Relative %: 56 %
Platelets: 195 10*3/uL (ref 150–400)
RBC: 2.8 MIL/uL — ABNORMAL LOW (ref 4.22–5.81)
RDW: 13.7 % (ref 11.5–15.5)
WBC: 7.4 10*3/uL (ref 4.0–10.5)
nRBC: 0 % (ref 0.0–0.2)

## 2022-07-18 LAB — COMPREHENSIVE METABOLIC PANEL
ALT: 23 U/L (ref 0–44)
AST: 34 U/L (ref 15–41)
Albumin: 3.1 g/dL — ABNORMAL LOW (ref 3.5–5.0)
Alkaline Phosphatase: 88 U/L (ref 38–126)
Anion gap: 21 — ABNORMAL HIGH (ref 5–15)
BUN: 144 mg/dL — ABNORMAL HIGH (ref 8–23)
CO2: 26 mmol/L (ref 22–32)
Calcium: 8.6 mg/dL — ABNORMAL LOW (ref 8.9–10.3)
Chloride: 93 mmol/L — ABNORMAL LOW (ref 98–111)
Creatinine, Ser: 15.45 mg/dL — ABNORMAL HIGH (ref 0.61–1.24)
GFR, Estimated: 3 mL/min — ABNORMAL LOW (ref >60)
Glucose, Bld: 87 mg/dL (ref 70–99)
Potassium: 6.5 mmol/L (ref 3.5–5.1)
Sodium: 140 mmol/L (ref 135–145)
Total Bilirubin: 0.7 mg/dL (ref 0.3–1.2)
Total Protein: 7.6 g/dL (ref 6.5–8.1)

## 2022-07-18 LAB — LACTIC ACID, PLASMA: Lactic Acid, Venous: 1.6 mmol/L (ref 0.5–1.9)

## 2022-07-18 LAB — CBC
HCT: 31 % — ABNORMAL LOW (ref 39.0–52.0)
Hemoglobin: 10.3 g/dL — ABNORMAL LOW (ref 13.0–17.0)
MCH: 34.7 pg — ABNORMAL HIGH (ref 26.0–34.0)
MCHC: 33.2 g/dL (ref 30.0–36.0)
MCV: 104.4 fL — ABNORMAL HIGH (ref 80.0–100.0)
Platelets: 197 10*3/uL (ref 150–400)
RBC: 2.97 MIL/uL — ABNORMAL LOW (ref 4.22–5.81)
RDW: 13.7 % (ref 11.5–15.5)
WBC: 6.3 10*3/uL (ref 4.0–10.5)
nRBC: 0 % (ref 0.0–0.2)

## 2022-07-18 LAB — HEPATITIS C ANTIBODY: HCV Ab: NONREACTIVE

## 2022-07-18 LAB — MAGNESIUM: Magnesium: 2.7 mg/dL — ABNORMAL HIGH (ref 1.7–2.4)

## 2022-07-18 LAB — CREATININE, SERUM
Creatinine, Ser: 15.58 mg/dL — ABNORMAL HIGH (ref 0.61–1.24)
GFR, Estimated: 3 mL/min — ABNORMAL LOW (ref >60)

## 2022-07-18 LAB — HEPATITIS B SURFACE ANTIBODY,QUALITATIVE: Hep B S Ab: REACTIVE — AB

## 2022-07-18 LAB — POC OCCULT BLOOD, ED: Fecal Occult Bld: POSITIVE — AB

## 2022-07-18 LAB — HEPATITIS B SURFACE ANTIGEN: Hepatitis B Surface Ag: NONREACTIVE

## 2022-07-18 LAB — HEPATITIS B CORE ANTIBODY, TOTAL: Hep B Core Total Ab: REACTIVE — AB

## 2022-07-18 MED ORDER — METRONIDAZOLE 500 MG/100ML IV SOLN
500.0000 mg | Freq: Two times a day (BID) | INTRAVENOUS | Status: DC
  Administered 2022-07-19 – 2022-07-21 (×5): 500 mg via INTRAVENOUS
  Filled 2022-07-18 (×7): qty 100

## 2022-07-18 MED ORDER — ATORVASTATIN CALCIUM 10 MG PO TABS
10.0000 mg | ORAL_TABLET | Freq: Every day | ORAL | Status: DC
  Administered 2022-07-20 – 2022-07-21 (×2): 10 mg via ORAL
  Filled 2022-07-18 (×4): qty 1

## 2022-07-18 MED ORDER — FOLIC ACID 1 MG PO TABS
1.0000 mg | ORAL_TABLET | Freq: Every day | ORAL | Status: DC
  Administered 2022-07-20 – 2022-07-21 (×2): 1 mg via ORAL
  Filled 2022-07-18 (×4): qty 1

## 2022-07-18 MED ORDER — ONDANSETRON HCL 4 MG/2ML IJ SOLN: 4.0000 mg | Freq: Four times a day (QID) | INTRAMUSCULAR | Status: DC | PRN

## 2022-07-18 MED ORDER — ISOSORBIDE MONONITRATE ER 30 MG PO TB24
30.0000 mg | ORAL_TABLET | Freq: Every day | ORAL | Status: DC
  Administered 2022-07-20 – 2022-07-21 (×2): 30 mg via ORAL
  Filled 2022-07-18 (×3): qty 1

## 2022-07-18 MED ORDER — INSULIN ASPART 100 UNIT/ML IJ SOLN: 0.0000 [IU] | Freq: Every day | INTRAMUSCULAR | Status: DC

## 2022-07-18 MED ORDER — DOCUSATE SODIUM 100 MG PO CAPS
100.0000 mg | ORAL_CAPSULE | Freq: Every day | ORAL | Status: DC | PRN
  Filled 2022-07-18: qty 1

## 2022-07-18 MED ORDER — TIMOLOL MALEATE 0.5 % OP SOLN
1.0000 [drp] | Freq: Every morning | OPHTHALMIC | Status: DC
  Administered 2022-07-20 – 2022-07-22 (×3): 1 [drp] via OPHTHALMIC
  Filled 2022-07-18 (×2): qty 5

## 2022-07-18 MED ORDER — CHLORHEXIDINE GLUCONATE CLOTH 2 % EX PADS
6.0000 | MEDICATED_PAD | Freq: Every day | CUTANEOUS | Status: DC
  Administered 2022-07-20: 6 via TOPICAL

## 2022-07-18 MED ORDER — LATANOPROST 0.005 % OP SOLN
1.0000 [drp] | Freq: Every day | OPHTHALMIC | Status: DC
  Administered 2022-07-21: 1 [drp] via OPHTHALMIC
  Filled 2022-07-18 (×2): qty 2.5

## 2022-07-18 MED ORDER — VANCOMYCIN HCL 1750 MG/350ML IV SOLN
1750.0000 mg | Freq: Once | INTRAVENOUS | Status: AC
  Administered 2022-07-19: 1750 mg via INTRAVENOUS
  Filled 2022-07-18 (×2): qty 350

## 2022-07-18 MED ORDER — VANCOMYCIN HCL IN DEXTROSE 1-5 GM/200ML-% IV SOLN
1000.0000 mg | INTRAVENOUS | Status: DC
  Administered 2022-07-20 – 2022-07-22 (×2): 1000 mg via INTRAVENOUS
  Filled 2022-07-18 (×5): qty 200

## 2022-07-18 MED ORDER — SODIUM CHLORIDE 0.9 % IV SOLN
1.0000 g | INTRAVENOUS | Status: DC
  Administered 2022-07-18 – 2022-07-21 (×4): 1 g via INTRAVENOUS
  Filled 2022-07-18 (×6): qty 10

## 2022-07-18 MED ORDER — VANCOMYCIN HCL IN DEXTROSE 1-5 GM/200ML-% IV SOLN: 1000.0000 mg | INTRAVENOUS | Status: DC

## 2022-07-18 MED ORDER — INSULIN ASPART 100 UNIT/ML IJ SOLN
0.0000 [IU] | Freq: Three times a day (TID) | INTRAMUSCULAR | Status: DC
  Administered 2022-07-21: 2 [IU] via SUBCUTANEOUS
  Administered 2022-07-21: 3 [IU] via SUBCUTANEOUS
  Administered 2022-07-21: 2 [IU] via SUBCUTANEOUS

## 2022-07-18 MED ORDER — CINACALCET HCL 30 MG PO TABS
30.0000 mg | ORAL_TABLET | ORAL | Status: DC
  Administered 2022-07-20 – 2022-07-22 (×2): 30 mg via ORAL
  Filled 2022-07-18 (×3): qty 1

## 2022-07-18 MED ORDER — ONDANSETRON HCL 4 MG PO TABS: 4.0000 mg | ORAL_TABLET | Freq: Four times a day (QID) | ORAL | Status: DC | PRN

## 2022-07-18 MED ORDER — OXYCODONE-ACETAMINOPHEN 5-325 MG PO TABS
1.0000 | ORAL_TABLET | Freq: Four times a day (QID) | ORAL | Status: DC | PRN
  Administered 2022-07-19 – 2022-07-20 (×4): 1 via ORAL
  Filled 2022-07-18 (×4): qty 1

## 2022-07-18 MED ORDER — SODIUM ZIRCONIUM CYCLOSILICATE 5 G PO PACK
5.0000 g | PACK | Freq: Once | ORAL | Status: DC
  Filled 2022-07-18: qty 1

## 2022-07-18 MED ORDER — HEPARIN SODIUM (PORCINE) 5000 UNIT/ML IJ SOLN
5000.0000 [IU] | Freq: Three times a day (TID) | INTRAMUSCULAR | Status: DC
  Administered 2022-07-19 – 2022-07-20 (×3): 5000 [IU] via SUBCUTANEOUS
  Filled 2022-07-18 (×4): qty 1

## 2022-07-18 MED ORDER — SODIUM CHLORIDE 0.9% FLUSH
3.0000 mL | Freq: Two times a day (BID) | INTRAVENOUS | Status: DC
  Administered 2022-07-19 – 2022-07-21 (×5): 3 mL via INTRAVENOUS

## 2022-07-18 MED ORDER — BICTEGRAVIR-EMTRICITAB-TENOFOV 50-200-25 MG PO TABS
1.0000 | ORAL_TABLET | Freq: Every day | ORAL | Status: DC
  Administered 2022-07-20 – 2022-07-21 (×2): 1 via ORAL
  Filled 2022-07-18 (×4): qty 1

## 2022-07-18 MED ORDER — CARVEDILOL 3.125 MG PO TABS
3.1250 mg | ORAL_TABLET | Freq: Two times a day (BID) | ORAL | Status: DC
  Administered 2022-07-19 – 2022-07-21 (×4): 3.125 mg via ORAL
  Filled 2022-07-18 (×6): qty 1

## 2022-07-18 MED ORDER — POLYETHYLENE GLYCOL 3350 17 G PO PACK: 17.0000 g | PACK | Freq: Every day | ORAL | Status: DC | PRN

## 2022-07-18 MED ORDER — CALCIUM ACETATE (PHOS BINDER) 667 MG PO CAPS
2001.0000 mg | ORAL_CAPSULE | Freq: Three times a day (TID) | ORAL | Status: DC
  Administered 2022-07-19 – 2022-07-21 (×5): 2001 mg via ORAL
  Filled 2022-07-18 (×6): qty 3

## 2022-07-18 MED ORDER — CALCITRIOL 0.5 MCG PO CAPS
1.0000 ug | ORAL_CAPSULE | ORAL | Status: DC
  Administered 2022-07-19 – 2022-07-22 (×3): 1 ug via ORAL
  Filled 2022-07-18 (×6): qty 2

## 2022-07-18 MED ORDER — SODIUM ZIRCONIUM CYCLOSILICATE 10 G PO PACK
10.0000 g | PACK | Freq: Once | ORAL | Status: AC
  Administered 2022-07-18: 10 g via ORAL
  Filled 2022-07-18: qty 1

## 2022-07-18 MED ORDER — GABAPENTIN 300 MG PO CAPS
300.0000 mg | ORAL_CAPSULE | Freq: Two times a day (BID) | ORAL | Status: DC
  Administered 2022-07-19 – 2022-07-21 (×4): 300 mg via ORAL
  Filled 2022-07-18 (×6): qty 1

## 2022-07-18 NOTE — ED Provider Triage Note (Signed)
Emergency Medicine Provider Triage Evaluation Note  Jacob [name] , [name] 71 y.o. male  was evaluated in triage.  Pt complains of multiple complaints.  Needs dialysis.  Last went last Wednesday.  Does occasionally make urine.  Occasional shortness of breath however none currently.  No chest pain.  Also notes he has worsening wound to his right foot.  Was previously seen by wound care.  States it was packed sometime ago.  Denies any redness or warmth.  States last time he was at wound care they were able to probe to the bone.  Cannot feel his feet at baseline.  Also noticed he has been having black stool.  No abdominal pain.  Review of Systems  Positive: Melena, needs dialysis, right foot wound Negative:   Physical Exam  BP (!) 115/58 (BP Location: Left Arm)   Pulse 76   Temp 97.8 F (36.6 C) (Oral)   Resp 18   Ht '5\' 9"'$  (1.753 m)   Wt 83.9 kg   SpO2 94%   BMI 27.32 kg/m  Gen:   Awake, no distress   Resp:  Normal effort  Cardiac: Doppler pulse to RLE MSK:   Moves extremities without difficulty, packed wound plantar aspect right foot near fourth and fifth base metacarpal Other:    Medical Decision Making  Medically screening exam initiated at 12:27 PM.  Appropriate orders placed.  Jacob [name] was informed that the remainder of the evaluation will be completed by another provider, this initial triage assessment does not replace that evaluation, and the importance of remaining in the ED until their evaluation is complete.  Melena, needs dialysis, right foot wound   Jacob Shepheard A, PA-C 07/18/22 1229

## 2022-07-18 NOTE — Consult Note (Cosign Needed Addendum)
[facility] Renal Consultation Note    Indication for Consultation:  Management of ESRD/hemodialysis; anemia, hypertension/volume and secondary hyperparathyroidism PCP: Dr. Alain Honey  HPI: Jacob [name] is a 71 y.o. male with ESRD on hemodialysis MWF at [facility]. Last HD 07/13/2022. PMH: DMT2, HTN,  GIB, polysubstance abuse, Combined systolic/diastolic HF, COPD, Hepatitis B, SHPT, anemia.   Patient presented to ED via EMS (Per patient) "because I needed dialysis and I feel and my foot is hurting". Patient says he missed HD 07/15/2022 D/T transportation issues. Says he feel at home last night, landing on buttocks. He also reports dark stools after being restarted on heparin at HD unit after clotting several systems. He was afebrile on arrival T-97.6 BP 130/69 HR 75 RR 16 O2 sats 100% on RA. SCr 15.45 BUN 144 K+ 6.5. He does not exhibit symptoms of uremia. Co2 26. WBC 7.4 HGB 9.5 PLT 195. Hemoglobin was 11.0 07/13/2022. Lactic acid 1.6. 2V CXR Pulmonary venous congestion without evidence of overt pulmonary edema. Xray L foot New lucency and irregularity of the distal fourth metatarsal and base of the proximal right phalanx, highly concerning for osteomyelitis. Podiatry consulted. We will order HD tonight for hyperkalemia and volume removal.   Past Medical History:  Diagnosis Date   Acute respiratory failure ([place]) 03/01/2018   Anemia    Arthritis    "all over; mostly knees and back" (02/28/2018)   Chronic combined systolic and diastolic CHF (congestive heart failure) (HCC)    Chronic lower back pain    stenosis   Community acquired pneumonia 09/06/2013   COPD (chronic obstructive pulmonary disease) ([place])    Coronary atherosclerosis of native coronary artery    a. 02/2003 s/p CABG x 2 (VG->RI, VG->RPDA; b. 11/2019 PCI: LM nl, LAD 90d, D3 50, RI 100, LCX 100p, OM3 100 - fills via L->L collats from D2/dLAD, RCA 100p, VG->RPDA ok, VG->RI 95 (3.5x48 Synergy XD DES).    Drug abuse ([place])    hx; tested for cocaine as recently as 2/08. says he is not using drugs now - avoided defib. for this reason    ESRD (end stage renal disease) ([place])    Hemo M-W-F- [facility]   Fall at home 10/2020   GERD (gastroesophageal reflux disease)    takes OTC meds as needed   GI bleeding    a. 11/2019 EGD: angiodysplastic lesions w/ bleeding s/p argon plasma/clipping/epi inj. Multiple admissions for the same.   Glaucoma    uses eye drops daily   Hepatitis B 1968   "tx'd w/isolation; caught it from toilet stools in gym"   History of blood transfusion 03/01/2019   History of colon polyps    benign   History of gout    takes Allopurinol daily as well as Colchicine-if needed (02/28/2018)   History of kidney stones    HTN (hypertension)    takes Coreg,Imdur.and Apresoline daily   Human immunodeficiency virus (HIV) disease ([place]) dx'd 1995   on [place] as of 12/2020.     Hyperlipidemia    Ischemic cardiomyopathy    a. 01/2019 Echo: EF 40-45%, diffuse HK, mild basal septal hypertrophy. Diast dysfxn. Nl RV size/fxn. Sev dil LA. Triv MR/TR/PR.   Muscle spasm    takes Zanaflex as needed   Myocardial infarction ([place]) ~ 2004/2005   Nocturia    Peripheral neuropathy    takes gabapentin daily   Pneumonia    "at least twice" (02/28/2018)   SDH (subdural hematoma) ([place])  Syphilis, unspecified    Type II diabetes mellitus ([place]) 2004   Lantus daily.Average fasting blood sugar 125-199   Wears glasses    Wears partial dentures    Past Surgical History:  Procedure Laterality Date   AV FISTULA PLACEMENT Left 08/02/2018   Procedure: ARTERIOVENOUS (AV) FISTULA CREATION  left arm radiocephlic;  Surgeon: Marty Heck, MD;  Location: [place];  Service: Vascular;  Laterality: Left;   AV FISTULA PLACEMENT Left 08/01/2019   Procedure: LEFT BRACHIOCEPHALIC ARTERIOVENOUS (AV) FISTULA CREATION;  Surgeon: [name], MD;  Location: [place];  Service: Vascular;  Laterality: Left;   AV FISTULA  PLACEMENT Right 04/12/2022   Procedure: RIGHT UPPER EXTREMITY ARTERIOVENOUS (AV)  GRAFT INSERTION USING GORE STRETCH 4-7 MM;  Surgeon: Angelia Mould, MD;  Location: [place];  Service: Vascular;  Laterality: Right;   [place] Left 10/03/2019   Procedure: BASILIC VEIN TRANSPOSITION LEFT SECOND STAGE;  Surgeon: [name], MD;  Location: [place];  Service: Vascular;  Laterality: Left;   BIOPSY  01/25/2021   Procedure: BIOPSY;  Surgeon: Doran Stabler, MD;  Location: [facility] ENDOSCOPY;  Service: Gastroenterology;;   CARDIAC CATHETERIZATION  10/2002; 12/19/2004   [name] 03/08/2011   COLONOSCOPY  2013   [place]    CORONARY ARTERY BYPASS GRAFT  02/24/2003   CABG X2/notes 03/08/2011   CORONARY STENT INTERVENTION N/A 12/19/2019   Procedure: CORONARY STENT INTERVENTION;  Surgeon: Jettie Booze, MD;  Location: [place] CV LAB;  Service: Cardiovascular;  Laterality: N/A;   ENTEROSCOPY N/A 01/25/2021   Procedure: ENTEROSCOPY;  Surgeon: Doran Stabler, MD;  Location: [place];  Service: Gastroenterology;  Laterality: N/A;   ENTEROSCOPY N/A 02/13/2021   Procedure: ENTEROSCOPY;  Surgeon: Jackquline Denmark, MD;  Location: [facility] ENDOSCOPY;  Service: Endoscopy;  Laterality: N/A;   ENTEROSCOPY N/A 05/07/2021   Procedure: ENTEROSCOPY;  Surgeon: Yetta Flock, MD;  Location: [facility] ENDOSCOPY;  Service: Gastroenterology;  Laterality: N/A;   ESOPHAGOGASTRODUODENOSCOPY (EGD) WITH PROPOFOL N/A 02/08/2019   Procedure: ESOPHAGOGASTRODUODENOSCOPY (EGD) WITH PROPOFOL;  Surgeon: Milus Banister, MD;  Location: [place];  Service: Gastroenterology;  Laterality: N/A;   ESOPHAGOGASTRODUODENOSCOPY (EGD) WITH PROPOFOL N/A 12/22/2019   Procedure: ESOPHAGOGASTRODUODENOSCOPY (EGD) WITH PROPOFOL;  Surgeon: Lavena Bullion, DO;  Location: [place];  Service: Gastroenterology;  Laterality: N/A;   ESOPHAGOGASTRODUODENOSCOPY (EGD) WITH PROPOFOL N/A 10/19/2020   Procedure:  ESOPHAGOGASTRODUODENOSCOPY (EGD) WITH PROPOFOL;  Surgeon: Jackquline Denmark, MD;  Location: [facility] ENDOSCOPY;  Service: Endoscopy;  Laterality: N/A;   ESOPHAGOGASTRODUODENOSCOPY (EGD) WITH PROPOFOL N/A 12/22/2020   Procedure: ESOPHAGOGASTRODUODENOSCOPY (EGD) WITH PROPOFOL;  Surgeon: Gatha Mayer, MD;  Location: [place];  Service: Endoscopy;  Laterality: N/A;   ESOPHAGOGASTRODUODENOSCOPY (EGD) WITH PROPOFOL N/A 01/09/2021   Procedure: ESOPHAGOGASTRODUODENOSCOPY (EGD) WITH PROPOFOL;  Surgeon: Irene Shipper, MD;  Location: [facility] ENDOSCOPY;  Service: Endoscopy;  Laterality: N/A;   HEMOSTASIS CLIP PLACEMENT  12/22/2019   Procedure: HEMOSTASIS CLIP PLACEMENT;  Surgeon: Lavena Bullion, DO;  Location: [place] ENDOSCOPY;  Service: Gastroenterology;;   HEMOSTASIS CLIP PLACEMENT  12/22/2020   Procedure: HEMOSTASIS CLIP PLACEMENT;  Surgeon: Gatha Mayer, MD;  Location: [facility] ENDOSCOPY;  Service: Endoscopy;;   HEMOSTASIS CONTROL  12/22/2020   Procedure: HEMOSTASIS CONTROL/hemospray;  Surgeon: Gatha Mayer, MD;  Location: [place];  Service: Endoscopy;;   HOT HEMOSTASIS N/A 02/08/2019   Procedure: HOT HEMOSTASIS (ARGON PLASMA COAGULATION/BICAP);  Surgeon: Milus Banister, MD;  Location: [facility] ENDOSCOPY;  Service: Gastroenterology;  Laterality:  N/A;   HOT HEMOSTASIS N/A 12/22/2019   Procedure: HOT HEMOSTASIS (ARGON PLASMA COAGULATION/BICAP);  Surgeon: Lavena Bullion, DO;  Location: [facility] ENDOSCOPY;  Service: Gastroenterology;  Laterality: N/A;   HOT HEMOSTASIS N/A 10/19/2020   Procedure: HOT HEMOSTASIS (ARGON PLASMA COAGULATION/BICAP);  Surgeon: Jackquline Denmark, MD;  Location: [facility] ENDOSCOPY;  Service: Endoscopy;  Laterality: N/A;   HOT HEMOSTASIS N/A 12/22/2020   Procedure: HOT HEMOSTASIS (ARGON PLASMA COAGULATION/BICAP);  Surgeon: Gatha Mayer, MD;  Location: [facility] ENDOSCOPY;  Service: Endoscopy;  Laterality: N/A;   HOT HEMOSTASIS N/A 01/09/2021   Procedure: HOT HEMOSTASIS (ARGON PLASMA COAGULATION/BICAP);  Surgeon: Irene Shipper, MD;  Location: [facility] ENDOSCOPY;  Service: Endoscopy;  Laterality: N/A;   HOT HEMOSTASIS N/A 01/25/2021   Procedure: HOT HEMOSTASIS (ARGON PLASMA COAGULATION/BICAP);  Surgeon: Doran Stabler, MD;  Location: [place];  Service: Gastroenterology;  Laterality: N/A;   HOT HEMOSTASIS N/A 02/13/2021   Procedure: HOT HEMOSTASIS (ARGON PLASMA COAGULATION/BICAP);  Surgeon: Jackquline Denmark, MD;  Location: [facility] ENDOSCOPY;  Service: Endoscopy;  Laterality: N/A;   HOT HEMOSTASIS N/A 05/07/2021   Procedure: HOT HEMOSTASIS (ARGON PLASMA COAGULATION/BICAP);  Surgeon: Yetta Flock, MD;  Location: [facility] ENDOSCOPY;  Service: Gastroenterology;  Laterality: N/A;   INSERTION OF DIALYSIS CATHETER N/A 02/08/2022   Procedure: INSERTION OF TUNNELED DIALYSIS CATHETER;  Surgeon: Angelia Mould, MD;  Location: [place];  Service: Vascular;  Laterality: N/A;   INTERTROCHANTERIC HIP FRACTURE SURGERY Left 11/2006   [name] 03/08/2011   INTRAVASCULAR ULTRASOUND/IVUS N/A 12/19/2019   Procedure: Intravascular Ultrasound/IVUS;  Surgeon: Jettie Booze, MD;  Location: [place] CV LAB;  Service: Cardiovascular;  Laterality: N/A;   IR FLUORO GUIDE CV LINE LEFT  03/14/2022   IR FLUORO GUIDE CV LINE RIGHT  07/24/2019   IR FLUORO GUIDE CV LINE RIGHT  07/30/2019   IR US GUIDE VASC ACCESS RIGHT  07/24/2019   IR US GUIDE VASC ACCESS RIGHT  07/30/2019   LAPAROSCOPIC CHOLECYSTECTOMY  05/2006   LIGATION OF ARTERIOVENOUS  FISTULA Left 02/08/2022   Procedure: LIGATION OF LEFT ARTERIOVENOUS  FISTULA;  Surgeon: Angelia Mould, MD;  Location: [place];  Service: Vascular;  Laterality: Left;   LIGATION OF COMPETING BRANCHES OF ARTERIOVENOUS FISTULA Left 11/05/2018   Procedure: LIGATION OF COMPETING BRANCHES OF ARTERIOVENOUS FISTULA  LEFT  ARM;  Surgeon: Marty Heck, MD;  Location: [facility];  Service: Vascular;  Laterality: Left;   LUMBAR LAMINECTOMY/DECOMPRESSION MICRODISCECTOMY N/A 02/29/2016   Procedure: Left L4-5 Lateral  Recess Decompression, Removal Extradural Intraspinal Facet Cyst;  Surgeon: Marybelle Killings, MD;  Location: [place];  Service: Orthopedics;  Laterality: N/A;   MULTIPLE TOOTH EXTRACTIONS     ORIF MANDIBULAR FRACTURE Left 08/13/2004   ORIF of left body fracture mandible with KLS Martin 2.3-mm six hole/notes 03/08/2011   RIGHT/LEFT HEART CATH AND CORONARY/GRAFT ANGIOGRAPHY N/A 12/19/2019   Procedure: RIGHT/LEFT HEART CATH AND CORONARY/GRAFT ANGIOGRAPHY;  Surgeon: Jettie Booze, MD;  Location: [place] CV LAB;  Service: Cardiovascular;  Laterality: N/A;   SCLEROTHERAPY  12/22/2019   Procedure: SCLEROTHERAPY;  Surgeon: Lavena Bullion, DO;  Location: [place];  Service: Gastroenterology;;   Clide Deutscher  02/13/2021   Procedure: Clide Deutscher;  Surgeon: Jackquline Denmark, MD;  Location: [facility] ENDOSCOPY;  Service: Endoscopy;;   Family History  Problem Relation Age of Onset   Heart failure Father    Hypertension Father    Diabetes Brother    Heart attack Brother    Alzheimer's disease Mother  Stroke Sister    Diabetes Sister    Alzheimer's disease Sister    Hypertension Brother    Diabetes Brother    Drug abuse Brother    Colon cancer Neg Hx    Social History:  reports that he has been smoking cigarettes. He has a 21.50 pack-year smoking history. He has never been exposed to tobacco smoke. He has never used smokeless tobacco. He reports current alcohol use of about 12.0 standard drinks of alcohol per week. He reports that he does not currently use drugs after having used the following drugs: Cocaine. Allergies  Allergen Reactions   Augmentin [Amoxicillin-Pot Clavulanate] Diarrhea and Other (See Comments)    Severe diarrhea   Morphine Sulfate Anaphylaxis    Can't mix with oxycodone    Mucinex Fast-Max Other (See Comments)    Intense sweating    Amphetamines Other (See Comments)    Unknown reaction   Prior to Admission medications   Medication Sig Start Date End Date Taking?  Authorizing Provider  acetaminophen (TYLENOL) 650 MG CR tablet Take 1,300 mg by mouth 3 (three) times daily.    [provider]  allopurinol (ZYLOPRIM) 100 MG tablet Take one tablet by mouth once daily. 05/11/22   [name], MD  atorvastatin (LIPITOR) 10 MG tablet TAKE 1 TABLET(10 MG) BY MOUTH DAILY 02/14/22   [name], MD  B-D UF III MINI PEN NEEDLES 31G X 5 MM MISC USE 4 TIMES DAILY 06/06/22   [name], MD  BIKTARVY 50-200-25 MG TABS tablet Take 1 tablet by mouth daily. 03/10/22   [name], MD  Calcium Acetate 667 MG TABS Take 667-2,001 mg by mouth See admin instructions. Take 2001 mg by mouth three times a day with meals and 667 mg with each snack 01/20/21   [provider]  carvedilol (COREG) 3.125 MG tablet Take 1 tablet (3.125 mg total) by mouth 2 (two) times daily with a meal. CALL AND SCHEDULE 1 YEAR FOLLOW UP OFFICE VISIT TO RECEIVE FURTHER REFILLS. [phone]. 05/11/22   [name], MD  colchicine 0.6 MG tablet Take one tablet by mouth once daily as needed for gout. 03/28/22   [name], NP  diclofenac Sodium (VOLTAREN) 1 % GEL APPLY 2 GRAMS TOPICALLY TO THE AFFECTED AREA THREE TIMES DAILY AS NEEDED FOR PAIN 04/29/22   [name], MD  docusate sodium (COLACE) 100 MG capsule Take 100 mg by mouth daily as needed for mild constipation.    [provider]  folic acid (FOLVITE) 1 MG tablet TAKE 1 TABLET(1 MG) BY MOUTH DAILY 05/11/22   [name], MD  gabapentin (NEURONTIN) 300 MG capsule TAKE 1 CAPSULE BY MOUTH DAILY. 05/11/22   [name], MD  glucose blood [facility] ULTRA) test strip Check blood sugar three times daily E11.40 11/10/21   [name], MD  insulin lispro (HUMALOG) 100 UNIT/ML KwikPen INJECT 20 UNITS UNDER THE SKIN THREE TIMES DAILY AS NEEDED FOR HIGH BLOOD SUGAR(ABOVE 150) 06/09/22   [name], MD  isosorbide mononitrate (IMDUR) 30 MG 24 hr tablet Take 1 tablet (30 mg total) by mouth  daily. 05/11/22   [name], MD  Lancets [facility] DELICA PLUS [id number]) MISC 1 Device by Other route 3 (three) times daily. E11.42 07/09/21   [name], NP  latanoprost (XALATAN) 0.005 % ophthalmic solution Place 1 drop into both eyes at bedtime.    [provider]  lidocaine-prilocaine (EMLA) cream Apply 1  application topically every Monday, Wednesday, and Friday with hemodialysis.    [provider]  Menthol-Camphor (TIGER BALM ARTHRITIS RUB) 11-11 % CREA Apply 1 Application topically daily as needed (pain).    [provider]  multivitamin (RENA-VIT) TABS tablet Take 1 tablet by mouth daily.    [provider]  nitroGLYCERIN (NITROSTAT) 0.3 MG SL tablet PLACE 1 TABLET UNDER THE TONGUE AS NEEDED FOR CHEST PAIN 03/29/22   [name], MD  octreotide (SANDOSTATIN LAR DEPOT) 10 MG injection Inject 10 mg into the muscle every 28 days. 04/21/22   Armbruster, Carlota Raspberry, MD  ondansetron (ZOFRAN) 4 MG tablet Take 1 tablet (4 mg total) by mouth every 8 (eight) hours as needed for nausea or vomiting. 06/15/21   [name], Dinah C, NP  oxyCODONE-acetaminophen (PERCOCET) 5-325 MG tablet Take 1 tablet by mouth every 6 (six) hours as needed for severe pain. 07/11/22 07/11/23  [name], MD  polyethylene glycol (MIRALAX / GLYCOLAX) 17 g packet Take 17 g by mouth daily as needed for mild constipation. 11/07/20   Aline August, MD  timolol (TIMOPTIC) 0.5 % ophthalmic solution Place 1 drop into both eyes in the morning. 04/04/19   [provider]  umeclidinium-vilanterol [facility] ELLIPTA) 62.5-25 MCG/ACT AEPB INHALE 1 PUFF BY MOUTH EVERY DAY 05/18/22   [name], MD   No current facility-administered medications for this encounter.   Current Outpatient Medications  Medication Sig Dispense Refill   acetaminophen (TYLENOL) 650 MG CR tablet Take 1,300 mg by mouth 3 (three) times daily.     allopurinol (ZYLOPRIM) 100 MG tablet Take one tablet by  mouth once daily. 90 tablet 2   atorvastatin (LIPITOR) 10 MG tablet TAKE 1 TABLET(10 MG) BY MOUTH DAILY 90 tablet 2   B-D UF III MINI PEN NEEDLES 31G X 5 MM MISC USE 4 TIMES DAILY 100 each 11   BIKTARVY 50-200-25 MG TABS tablet Take 1 tablet by mouth daily. 30 tablet 11   Calcium Acetate 667 MG TABS Take 667-2,001 mg by mouth See admin instructions. Take 2001 mg by mouth three times a day with meals and 667 mg with each snack     carvedilol (COREG) 3.125 MG tablet Take 1 tablet (3.125 mg total) by mouth 2 (two) times daily with a meal. CALL AND SCHEDULE 1 YEAR FOLLOW UP OFFICE VISIT TO RECEIVE FURTHER REFILLS. [phone]. 180 tablet 0   colchicine 0.6 MG tablet Take one tablet by mouth once daily as needed for gout. 30 tablet 5   diclofenac Sodium (VOLTAREN) 1 % GEL APPLY 2 GRAMS TOPICALLY TO THE AFFECTED AREA THREE TIMES DAILY AS NEEDED FOR PAIN 600 g 1   docusate sodium (COLACE) 100 MG capsule Take 100 mg by mouth daily as needed for mild constipation.     folic acid (FOLVITE) 1 MG tablet TAKE 1 TABLET(1 MG) BY MOUTH DAILY 90 tablet 2   gabapentin (NEURONTIN) 300 MG capsule TAKE 1 CAPSULE BY MOUTH DAILY. 90 capsule 2   glucose blood (ONETOUCH ULTRA) test strip Check blood sugar three times daily E11.40 300 strip 3   insulin lispro (HUMALOG) 100 UNIT/ML KwikPen INJECT 20 UNITS UNDER THE SKIN THREE TIMES DAILY AS NEEDED FOR HIGH BLOOD SUGAR(ABOVE 150) 45 mL 1   isosorbide mononitrate (IMDUR) 30 MG 24 hr tablet Take 1 tablet (30 mg total) by mouth daily. 90 tablet 2   Lancets (ONETOUCH DELICA PLUS [id number]) MISC 1 Device by Other route 3 (three) times daily. E11.42 300  each 1   latanoprost (XALATAN) 0.005 % ophthalmic solution Place 1 drop into both eyes at bedtime.     lidocaine-prilocaine (EMLA) cream Apply 1 application topically every Monday, Wednesday, and Friday with hemodialysis.     Menthol-Camphor (TIGER BALM ARTHRITIS RUB) 11-11 % CREA Apply 1 Application topically daily as needed  (pain).     multivitamin (RENA-VIT) TABS tablet Take 1 tablet by mouth daily.     nitroGLYCERIN (NITROSTAT) 0.3 MG SL tablet PLACE 1 TABLET UNDER THE TONGUE AS NEEDED FOR CHEST PAIN 75 tablet 1   octreotide (SANDOSTATIN LAR DEPOT) 10 MG injection Inject 10 mg into the muscle every 28 days. 1 kit 3   ondansetron (ZOFRAN) 4 MG tablet Take 1 tablet (4 mg total) by mouth every 8 (eight) hours as needed for nausea or vomiting. 20 tablet 0   oxyCODONE-acetaminophen (PERCOCET) 5-325 MG tablet Take 1 tablet by mouth every 6 (six) hours as needed for severe pain. 30 tablet 0   polyethylene glycol (MIRALAX / GLYCOLAX) 17 g packet Take 17 g by mouth daily as needed for mild constipation. 14 each 0   timolol (TIMOPTIC) 0.5 % ophthalmic solution Place 1 drop into both eyes in the morning.     umeclidinium-vilanterol (ANORO ELLIPTA) 62.5-25 MCG/ACT AEPB INHALE 1 PUFF BY MOUTH EVERY DAY 60 each 11   Labs: Basic Metabolic Panel: Recent Labs  Lab 07/18/22 1240  NA 140  K 6.5*  CL 93*  CO2 26  GLUCOSE 87  BUN 144*  CREATININE 15.45*  CALCIUM 8.6*   Liver Function Tests: Recent Labs  Lab 07/18/22 1240  AST 34  ALT 23  ALKPHOS 88  BILITOT 0.7  PROT 7.6  ALBUMIN 3.1*   Recent Labs  Lab 07/18/22 1240  LIPASE 31   No results for input(s): "AMMONIA" in the last 168 hours. CBC: Recent Labs  Lab 07/18/22 1240  WBC 7.4  NEUTROABS 4.2  HGB 9.5*  HCT 29.9*  MCV 106.8*  PLT 195   Cardiac Enzymes: No results for input(s): "CKTOTAL", "CKMB", "CKMBINDEX", "TROPONINI" in the last 168 hours. CBG: No results for input(s): "GLUCAP" in the last 168 hours. Iron Studies: No results for input(s): "IRON", "TIBC", "TRANSFERRIN", "FERRITIN" in the last 72 hours. Studies/Results: DG Foot Complete Right  Result Date: 07/18/2022 CLINICAL DATA:  Diabetic wound, assess for osteomyelitis EXAM: RIGHT FOOT COMPLETE - 3 VIEW COMPARISON:  Right foot radiograph dated November 16, 2021 FINDINGS: New lucency and  irregularity of the distal fourth metatarsal and base of the proximal right phalanx. Moderate degenerative changes of the midfoot and great right toe metatarsal joint. Vascular calcifications. Soft tissues are unremarkable. IMPRESSION: New lucency and irregularity of the distal fourth metatarsal and base of the proximal right phalanx, highly concerning for osteomyelitis. Could be further evaluated with MRI. Electronically Signed   By: Yetta Glassman M.D.   On: 07/18/2022 13:59   DG Chest 2 View  Result Date: 07/18/2022 CLINICAL DATA:  Shortness of breath EXAM: CHEST - 2 VIEW COMPARISON:  Chest radiograph June 28, 2022 FINDINGS: No pleural effusion. No pneumothorax. Postsurgical changes from median sternotomy and CABG. Aortic atherosclerotic calcifications. Interval removal of the left-sided tunneled central venous catheter. There is pulmonary venous congestion without evidence of overt pulmonary edema. Cardiomegaly. Chronic healed right-sided rib fractures. Degenerative changes of the right AC joint and glenohumeral joint. IMPRESSION: 1. Pulmonary venous congestion without evidence of overt pulmonary edema. 2.  No focal airspace opacity. Electronically Signed   By: Marin Roberts  M.D.   On: 07/18/2022 13:46    ROS: As per HPI otherwise negative.   Physical Exam: Vitals:   07/18/22 1539 07/18/22 1601 07/18/22 1615 07/18/22 1630  BP: (!) 146/78 130/69 124/67 132/66  Pulse: 82 75    Resp: _0 Temp:  97.6 F (36.4 C)    TempSrc:  Oral    SpO2: 95% 100%  100%  Weight:      Height:         General: Chronically ill appearing male in NAD Head: Normocephalic, atraumatic, sclera non-icteric, mucus membranes are moist Neck: Supple. JVD slightly elevated 1/4 from clavicles.  Lungs: Bilateral breath sounds with coarse upper airway sounds, few scattered bibasilar crackles.  Heart: RRR with S1 S2. No murmurs, rubs, or gallops appreciated. Abdomen: Soft, non-tender, non-distended with  normoactive bowel sounds. No rebound/guarding. No obvious abdominal masses. M-S:  Strength and tone appear normal for age. Lower extremities: RLE edema with wound on plantar aspect of foot distal to 4th, 5th metatarsal. Erythema R foot. Trace LLE edema.  Neuro: Alert and oriented X 3. Moves all extremities spontaneously. No asterixis.  Psych:  Responds to questions appropriately with a normal affect. Dialysis Access: R AVG + T/B  Dialysis Orders: Center:[facility] MWF 3 hr 45 min 180NRe 400/Autoflow 1.5 82 kg 2.0 K/2.0 Ca AVG -No heparin -Mircera 75 mcg IV q 2 weeks (last dose 07/06/2022) -Calcitriol 1.0 mcg PO TIW -Sensipar 30 mg PO TIW  Assessment/Plan:  Volume overload in setting of missed HD-Vascular congestion on CXR with LE edema. HD later tonight. UF as tolerated.  Hyperkalemia-Given lokelma in ED. Use 2.0 K bath with HD.   Osteomyelitis R foot-W/U per primary. Podiatry consulted.   Possible GIB-BUN elevated. Azotemia without signs of uremia. Reports black stools. HGB 11.0 07/13/2022 down to 9.5 today. Order FOBT now. Per primary  ESRD -  MWF-missed HD 07/15/2022. HD today on schedule. NO HEPARIN.   Hypertension/volume  - As noted above. BP well controlled. Low dose carvedilol on OP med list. Has been ordered.   Anemia  - As noted above. ESA due 07/20/2022. Will order. Follow HGB. Transfuse as needed.   Metabolic bone disease -  OP PO4 at goal. Continue binders, VDRA & Sensipar.  Nutrition - Albumin low. Changed to renal/carb mod diet with protein supplements, nepro  DMT2-per primary HIV-meds per primary  Fay Swider H. Owens Shark, NP-C 07/18/2022, 4:55 PM  [employer] [phone]

## 2022-07-18 NOTE — ED Notes (Signed)
Pt currently in dialysis, unable to administer medications at this time

## 2022-07-18 NOTE — ED Provider Notes (Signed)
[place] EMERGENCY DEPARTMENT Provider Note   CSN: [id number] Arrival date & time: 07/18/22  1118     History {Add pertinent medical, surgical, social history, OB history to HPI:1} Chief Complaint  Patient presents with  . Diabetic Ulcer    Jacob [name] is a 71 y.o. male.  HPI   71 year old male presents emergency department with complaints of wound on right foot as well as missed dialysis.  Patient states that he has had a chronic right foot wound for the past 19 weeks of which has been handled outpatient by wound care.  He states worsening of the wound over the past several days.  He was told to stop walking on affected foot but has not been able to due to difficulties obtaining knee scooter.  He reports worsening of pain in foot and is concerned about infection spreading to bone.  Patient is a known dialysis patient receiving hemodialysis Monday Wednesday Friday.  He states he has not received dialysis since this past Wednesday.  Patient states he does not make urine.  He also makes note of having black stool for the past 3 to 4 days.  Denies a fever, chills, night sweats, chest pain, shortness of breath, abdominal pain, nausea, vomiting, changes in bowel habits.  Patient denies current anticoagulation.  Past medical history significant for CHF, CKD on hemodialysis, hypertension, GI bleed, diabetes mellitus type 2, myocardial infarction, COPD, hypertension, peripheral vascular disease,  Home Medications Prior to Admission medications   Medication Sig Start Date End Date Taking? Authorizing Provider  acetaminophen (TYLENOL) 650 MG CR tablet Take 1,300 mg by mouth 3 (three) times daily.    [provider]  allopurinol (ZYLOPRIM) 100 MG tablet Take one tablet by mouth once daily. 05/11/22   [name], MD  atorvastatin (LIPITOR) 10 MG tablet TAKE 1 TABLET(10 MG) BY MOUTH DAILY 02/14/22   [name], MD  B-D UF III MINI PEN NEEDLES 31G X 5 MM  MISC USE 4 TIMES DAILY 06/06/22   [name], MD  BIKTARVY 50-200-25 MG TABS tablet Take 1 tablet by mouth daily. 03/10/22   [name], MD  Calcium Acetate 667 MG TABS Take 667-2,001 mg by mouth See admin instructions. Take 2001 mg by mouth three times a day with meals and 667 mg with each snack 01/20/21   [provider]  carvedilol (COREG) 3.125 MG tablet Take 1 tablet (3.125 mg total) by mouth 2 (two) times daily with a meal. CALL AND SCHEDULE 1 YEAR FOLLOW UP OFFICE VISIT TO RECEIVE FURTHER REFILLS. [phone]. 05/11/22   [name], MD  colchicine 0.6 MG tablet Take one tablet by mouth once daily as needed for gout. 03/28/22   [name], NP  diclofenac Sodium (VOLTAREN) 1 % GEL APPLY 2 GRAMS TOPICALLY TO THE AFFECTED AREA THREE TIMES DAILY AS NEEDED FOR PAIN 04/29/22   [name], MD  docusate sodium (COLACE) 100 MG capsule Take 100 mg by mouth daily as needed for mild constipation.    [provider]  folic acid (FOLVITE) 1 MG tablet TAKE 1 TABLET(1 MG) BY MOUTH DAILY 05/11/22   [name], MD  gabapentin (NEURONTIN) 300 MG capsule TAKE 1 CAPSULE BY MOUTH DAILY. 05/11/22   [name], MD  glucose blood [facility] ULTRA) test strip Check blood sugar three times daily E11.40 11/10/21   [name], MD  insulin lispro (HUMALOG) 100 UNIT/ML KwikPen INJECT 20 UNITS UNDER THE SKIN  THREE TIMES DAILY AS NEEDED FOR HIGH BLOOD SUGAR(ABOVE 150) 06/09/22   [name], MD  isosorbide mononitrate (IMDUR) 30 MG 24 hr tablet Take 1 tablet (30 mg total) by mouth daily. 05/11/22   [name], MD  Lancets [facility] DELICA PLUS [id number]) MISC 1 Device by Other route 3 (three) times daily. E11.42 07/09/21   [name], NP  latanoprost (XALATAN) 0.005 % ophthalmic solution Place 1 drop into both eyes at bedtime.    [provider]  lidocaine-prilocaine (EMLA) cream Apply 1 application topically every Monday, Wednesday, and Friday  with hemodialysis.    [provider]  Menthol-Camphor (TIGER BALM ARTHRITIS RUB) 11-11 % CREA Apply 1 Application topically daily as needed (pain).    [provider]  multivitamin (RENA-VIT) TABS tablet Take 1 tablet by mouth daily.    [provider]  nitroGLYCERIN (NITROSTAT) 0.3 MG SL tablet PLACE 1 TABLET UNDER THE TONGUE AS NEEDED FOR CHEST PAIN 03/29/22   [name], MD  octreotide (SANDOSTATIN LAR DEPOT) 10 MG injection Inject 10 mg into the muscle every 28 days. 04/21/22   Armbruster, Carlota Raspberry, MD  ondansetron (ZOFRAN) 4 MG tablet Take 1 tablet (4 mg total) by mouth every 8 (eight) hours as needed for nausea or vomiting. 06/15/21   [name], Dinah C, NP  oxyCODONE-acetaminophen (PERCOCET) 5-325 MG tablet Take 1 tablet by mouth every 6 (six) hours as needed for severe pain. 07/11/22 07/11/23  [name], MD  polyethylene glycol (MIRALAX / GLYCOLAX) 17 g packet Take 17 g by mouth daily as needed for mild constipation. 11/07/20   Aline August, MD  timolol (TIMOPTIC) 0.5 % ophthalmic solution Place 1 drop into both eyes in the morning. 04/04/19   [provider]  umeclidinium-vilanterol [facility] ELLIPTA) 62.5-25 MCG/ACT AEPB INHALE 1 PUFF BY MOUTH EVERY DAY 05/18/22   [name], MD      Allergies    Augmentin [amoxicillin-pot clavulanate], Morphine sulfate, Mucinex fast-max, and Amphetamines    Review of Systems   Review of Systems  All other systems reviewed and are negative.   Physical Exam Updated Vital Signs BP (!) 115/58 (BP Location: Left Arm)   Pulse 76   Temp 97.8 F (36.6 C) (Oral)   Resp 18   Ht '5\' 9"'$  (1.753 m)   Wt 83.9 kg   SpO2 94%   BMI 27.32 kg/m  Physical Exam Vitals and nursing note reviewed.  Constitutional:      General: He is not in acute distress.    Appearance: He is well-developed.  HENT:     Head: Normocephalic and atraumatic.  Eyes:     Conjunctiva/sclera: Conjunctivae normal.  Cardiovascular:      Rate and Rhythm: Normal rate and regular rhythm.     Pulses: Normal pulses.     Heart sounds: No murmur heard. Pulmonary:     Effort: Pulmonary effort is normal. No respiratory distress.     Breath sounds: Normal breath sounds.     Comments: Mild Rales auscultated bilateral lung fields. Abdominal:     Palpations: Abdomen is soft.     Tenderness: There is no abdominal tenderness.  Musculoskeletal:        General: No swelling.     Cervical back: Neck supple.     Left lower leg: No edema.     Comments: Patient has right lower extremity edema.  With wound on the plantar aspect of right foot as pictured below.  Area is  warm to the touch as well as slightly erythemic in appearance.  Skin:    General: Skin is warm and dry.     Capillary Refill: Capillary refill takes less than 2 seconds.  Neurological:     Mental Status: He is alert.  Psychiatric:        Mood and Affect: Mood normal.       ED Results / Procedures / Treatments   Labs (all labs ordered are listed, but only abnormal results are displayed) Labs Reviewed  CBC WITH DIFFERENTIAL/PLATELET - Abnormal; Notable for the following components:      Result Value   RBC 2.80 (*)    Hemoglobin 9.5 (*)    HCT 29.9 (*)    MCV 106.8 (*)    All other components within normal limits  COMPREHENSIVE METABOLIC PANEL - Abnormal; Notable for the following components:   Potassium 6.5 (*)    Chloride 93 (*)    BUN 144 (*)    Creatinine, Ser 15.45 (*)    Calcium 8.6 (*)    Albumin 3.1 (*)    GFR, Estimated 3 (*)    Anion gap 21 (*)    All other components within normal limits  LACTIC ACID, PLASMA  LIPASE, BLOOD  LACTIC ACID, PLASMA  POC OCCULT BLOOD, ED    EKG None  Radiology DG Foot Complete Right  Result Date: 07/18/2022 CLINICAL DATA:  Diabetic wound, assess for osteomyelitis EXAM: RIGHT FOOT COMPLETE - 3 VIEW COMPARISON:  Right foot radiograph dated November 16, 2021 FINDINGS: New lucency and irregularity of the distal  fourth metatarsal and base of the proximal right phalanx. Moderate degenerative changes of the midfoot and great right toe metatarsal joint. Vascular calcifications. Soft tissues are unremarkable. IMPRESSION: New lucency and irregularity of the distal fourth metatarsal and base of the proximal right phalanx, highly concerning for osteomyelitis. Could be further evaluated with MRI. Electronically Signed   By: Yetta Glassman M.D.   On: 07/18/2022 13:59   DG Chest 2 View  Result Date: 07/18/2022 CLINICAL DATA:  Shortness of breath EXAM: CHEST - 2 VIEW COMPARISON:  Chest radiograph June 28, 2022 FINDINGS: No pleural effusion. No pneumothorax. Postsurgical changes from median sternotomy and CABG. Aortic atherosclerotic calcifications. Interval removal of the left-sided tunneled central venous catheter. There is pulmonary venous congestion without evidence of overt pulmonary edema. Cardiomegaly. Chronic healed right-sided rib fractures. Degenerative changes of the right AC joint and glenohumeral joint. IMPRESSION: 1. Pulmonary venous congestion without evidence of overt pulmonary edema. 2.  No focal airspace opacity. Electronically Signed   By: Marin Roberts M.D.   On: 07/18/2022 13:46    Procedures Procedures  {Document cardiac monitor, telemetry assessment procedure when appropriate:1}  Medications Ordered in ED Medications - No data to display  ED Course/ Medical Decision Making/ A&P Clinical Course as of 07/18/22 1827  Mon Jul 18, 2022  1645 Consulted Dr. [name] of nephrology regarding the patient.  They agreed with administration of 10 grams of Lokelma with pending dialysis. [CR]  [facility] Dr. [name] [CR]    Clinical Course User Index [CR] Wilnette Kales, PA                           Medical Decision Making Amount and/or Complexity of Data Reviewed Radiology: ordered.  Risk Prescription drug management. Decision regarding hospitalization.   This patient  presents to the ED for concern of right foot pain, this involves  an extensive number of treatment options, and is a complaint that carries with it a high risk of complications and morbidity.  The differential diagnosis includes osteomyelitis, compartment syndrome, fracture, strain/sprain, ulcerative wound, abscess, cellulitis, erysipelas   Co morbidities that complicate the patient evaluation  See HPI   Additional history obtained:  Additional history obtained from EMR External records from outside source obtained and reviewed including ***   Lab Tests:  I Ordered, and personally interpreted labs.  The pertinent results include:  ***   Imaging Studies ordered:  I ordered imaging studies including ***  I independently visualized and interpreted imaging which showed *** I agree with the radiologist interpretation   Cardiac Monitoring: / EKG:  The patient was maintained on a cardiac monitor.  I personally viewed and interpreted the cardiac monitored which showed an underlying rhythm of: ***   Consultations Obtained:  I requested consultation with the ***,  and discussed lab and imaging findings as well as pertinent plan - they recommend: ***   Problem List / ED Course / Critical interventions / Medication management  *** I ordered medication including ***  for ***  Reevaluation of the patient after these medicines showed that the patient {resolved/improved/worsened:23923::"improved"} I have reviewed the patients home medicines and have made adjustments as needed   Social Determinants of Health:  ***   Test / Admission - Considered:  Vitals signs significant for ***. Otherwise within normal range and stable throughout visit. Laboratory/imaging studies significant for: *** *** Treatment plan were discussed at length with patient and they knowledge understanding was agreeable to said plan.  Appropriate consultations were made as described in the ED course.  Patient was  stable upon admission to the hospital.   {Document critical care time when appropriate:1} {Document review of labs and clinical decision tools ie heart score, Chads2Vasc2 etc:1}  {Document your independent review of radiology images, and any outside records:1} {Document your discussion with family members, caretakers, and with consultants:1} {Document social determinants of health affecting pt's care:1} {Document your decision making why or why not admission, treatments were needed:1} Final Clinical Impression(s) / ED Diagnoses Final diagnoses:  None    Rx / DC Orders ED Discharge Orders     None

## 2022-07-18 NOTE — ED Notes (Signed)
Pt back from MRI at this time

## 2022-07-18 NOTE — ED Notes (Signed)
Went to administer pt antibiotics and pt was not in his room. Pt went to dialysis.

## 2022-07-18 NOTE — Progress Notes (Signed)
Pharmacy Antibiotic Note  Jacob [name] is a 71 y.o. male with T2DM, HTN, h/o GIB, systolic and diastolic CHF, COPD, HBV, anemia, and polysubstance abuse admitted on 07/18/2022 with  R diabetic foot wound infection .  Pt also has a history of ESRD on HD MWF, last HD was 9/20. Pharmacy has been consulted for Cefepime and Vancomycin dosing.  Plan: Start Cefepime 1g IV q24h Initiate loading dose of Vancomycin '1750mg'$  IV x 1, followed by  Vancomycin '1000mg'$  IV every hemodialysis MWF    > Goal trough level 15-20    > Check vancomycin levels at steady state  Plan for HD late tonight, 07/18/22, to remain on HD schedule Monitor daily CBC, temp, and for clinical signs of improvement  F/u nephrology plans for dialysis daily and adjust antibiotic schedule accordingly F/u cultures and de-escalate antibiotics as able   Height: '5\' 9"'$  (175.3 cm) Weight: 83.9 kg (185 lb) IBW/kg (Calculated) : 70.7  Temp (24hrs), Avg:97.7 F (36.5 C), Min:97.6 F (36.4 C), Max:97.8 F (36.6 C)  Recent Labs  Lab 07/18/22 1240  WBC 7.4  CREATININE 15.45*  LATICACIDVEN 1.6    Estimated Creatinine Clearance: 4.3 mL/min (A) (by C-G formula based on SCr of 15.45 mg/dL (H)).    Allergies  Allergen Reactions   Augmentin [Amoxicillin-Pot Clavulanate] Diarrhea and Other (See Comments)    Severe diarrhea   Morphine Sulfate Anaphylaxis    Can't mix with oxycodone    Mucinex Fast-Max Other (See Comments)    Intense sweating    Amphetamines Other (See Comments)    Unknown reaction    Antimicrobials this admission: Cefepime 9/25 >>  Vancomycin 9/25 >>   Dose adjustments this admission: N/A  Microbiology results: 9/25 BCx:   Thank you for allowing pharmacy to be a part of this patient's care.  Luisa Hart, PharmD, BCPS Clinical Pharmacist 07/18/2022 7:44 PM   Please refer to [facility] for pharmacy phone number

## 2022-07-18 NOTE — ED Notes (Addendum)
Report was given the Hemodialysis at 1910

## 2022-07-18 NOTE — H&P (Signed)
History and Physical    Jacob [name] RWE:[id number] DOB: 12-26-1950 DOA: 07/18/2022  PCP: [name], MD  Patient coming from: Home  I have personally briefly reviewed patient's old medical records in [place]  Chief Complaint: Worsening right foot diabetic wound  HPI: Jacob [name] is a 71 y.o. male with medical history significant of ESRD on HD, diminutive Friday, HIV, type 2 diabetes mellitus with right foot diabetic wound, hypertension and several other comorbidities who presented to ED with a complaint of worsening swelling, pain and infection of the right foot diabetic ulcer.  Per patient, he has been having this diabetic foot ulcer since about 19 weeks and has been getting wound care as outpatient.  He has been told to not put too much weight on the right foot and has diabetic shoes as well but since his scooter has not arrived yet, he has not been able to comply with instructions resulting in worsening pain and wound.  He denies any fever, chills, sweating or any other complaint.  The last time he was seen by wound care was on 07/14/2022.  He is not on any antibiotics.  Apart from that, patient tells [place] that due to inability to walk well, he has not received his dialysis since Wednesday last week.  He denies any shortness of breath.  ED Course: Upon arrival to ED, he was hemodynamically stable.  Right foot x-ray was done which shows suspicion of osteomyelitis of the distal fourth metatarsal and base of the proximal right phalanx of the right foot.  MRI is pending.  Was found to have hyperkalemia.  Nephrology has been consulted by ED.  Per ED provider, podiatry has been paged out but they are waiting for callback.  Patient has not been provided with antibiotics yet since ED provider is waiting for instructions from podiatry.  Review of Systems: As per HPI otherwise negative.    Past Medical History:  Diagnosis Date   Acute respiratory failure ([place]) 03/01/2018   Anemia     Arthritis    "all over; mostly knees and back" (02/28/2018)   Chronic combined systolic and diastolic CHF (congestive heart failure) (HCC)    Chronic lower back pain    stenosis   Community acquired pneumonia 09/06/2013   COPD (chronic obstructive pulmonary disease) ([place])    Coronary atherosclerosis of native coronary artery    a. 02/2003 s/p CABG x 2 (VG->RI, VG->RPDA; b. 11/2019 PCI: LM nl, LAD 90d, D3 50, RI 100, LCX 100p, OM3 100 - fills via L->L collats from D2/dLAD, RCA 100p, VG->RPDA ok, VG->RI 95 (3.5x48 Synergy XD DES).   Drug abuse ([place])    hx; tested for cocaine as recently as 2/08. says he is not using drugs now - avoided defib. for this reason    ESRD (end stage renal disease) ([place])    Hemo M-W-F- [facility]   Fall at home 10/2020   GERD (gastroesophageal reflux disease)    takes OTC meds as needed   GI bleeding    a. 11/2019 EGD: angiodysplastic lesions w/ bleeding s/p argon plasma/clipping/epi inj. Multiple admissions for the same.   Glaucoma    uses eye drops daily   Hepatitis B 1968   "tx'd w/isolation; caught it from toilet stools in gym"   History of blood transfusion 03/01/2019   History of colon polyps    benign   History of gout    takes Allopurinol daily as well as Colchicine-if needed (02/28/2018)  History of kidney stones    HTN (hypertension)    takes Coreg,Imdur.and Apresoline daily   Human immunodeficiency virus (HIV) disease ([place]) dx'd 1995   on [place] as of 12/2020.     Hyperlipidemia    Ischemic cardiomyopathy    a. 01/2019 Echo: EF 40-45%, diffuse HK, mild basal septal hypertrophy. Diast dysfxn. Nl RV size/fxn. Sev dil LA. Triv MR/TR/PR.   Muscle spasm    takes Zanaflex as needed   Myocardial infarction ([place]) ~ 2004/2005   Nocturia    Peripheral neuropathy    takes gabapentin daily   Pneumonia    "at least twice" (02/28/2018)   SDH (subdural hematoma) (HCC)    Syphilis, unspecified    Type II diabetes mellitus ([place]) 2004   Lantus daily.Average  fasting blood sugar 125-199   Wears glasses    Wears partial dentures     Past Surgical History:  Procedure Laterality Date   AV FISTULA PLACEMENT Left 08/02/2018   Procedure: ARTERIOVENOUS (AV) FISTULA CREATION  left arm radiocephlic;  Surgeon: Marty Heck, MD;  Location: [place];  Service: Vascular;  Laterality: Left;   AV FISTULA PLACEMENT Left 08/01/2019   Procedure: LEFT BRACHIOCEPHALIC ARTERIOVENOUS (AV) FISTULA CREATION;  Surgeon: [name], MD;  Location: [place];  Service: Vascular;  Laterality: Left;   AV FISTULA PLACEMENT Right 04/12/2022   Procedure: RIGHT UPPER EXTREMITY ARTERIOVENOUS (AV)  GRAFT INSERTION USING GORE STRETCH 4-7 MM;  Surgeon: Angelia Mould, MD;  Location: [place];  Service: Vascular;  Laterality: Right;   [place] Left 10/03/2019   Procedure: BASILIC VEIN TRANSPOSITION LEFT SECOND STAGE;  Surgeon: [name], MD;  Location: [place];  Service: Vascular;  Laterality: Left;   BIOPSY  01/25/2021   Procedure: BIOPSY;  Surgeon: Doran Stabler, MD;  Location: [facility] ENDOSCOPY;  Service: Gastroenterology;;   CARDIAC CATHETERIZATION  10/2002; 12/19/2004   [name] 03/08/2011   COLONOSCOPY  2013   [place]    CORONARY ARTERY BYPASS GRAFT  02/24/2003   CABG X2/notes 03/08/2011   CORONARY STENT INTERVENTION N/A 12/19/2019   Procedure: CORONARY STENT INTERVENTION;  Surgeon: Jettie Booze, MD;  Location: [place] CV LAB;  Service: Cardiovascular;  Laterality: N/A;   ENTEROSCOPY N/A 01/25/2021   Procedure: ENTEROSCOPY;  Surgeon: Doran Stabler, MD;  Location: [place];  Service: Gastroenterology;  Laterality: N/A;   ENTEROSCOPY N/A 02/13/2021   Procedure: ENTEROSCOPY;  Surgeon: Jackquline Denmark, MD;  Location: [facility] ENDOSCOPY;  Service: Endoscopy;  Laterality: N/A;   ENTEROSCOPY N/A 05/07/2021   Procedure: ENTEROSCOPY;  Surgeon: Yetta Flock, MD;  Location: [facility] ENDOSCOPY;  Service: Gastroenterology;  Laterality: N/A;    ESOPHAGOGASTRODUODENOSCOPY (EGD) WITH PROPOFOL N/A 02/08/2019   Procedure: ESOPHAGOGASTRODUODENOSCOPY (EGD) WITH PROPOFOL;  Surgeon: Milus Banister, MD;  Location: [place];  Service: Gastroenterology;  Laterality: N/A;   ESOPHAGOGASTRODUODENOSCOPY (EGD) WITH PROPOFOL N/A 12/22/2019   Procedure: ESOPHAGOGASTRODUODENOSCOPY (EGD) WITH PROPOFOL;  Surgeon: Lavena Bullion, DO;  Location: [place];  Service: Gastroenterology;  Laterality: N/A;   ESOPHAGOGASTRODUODENOSCOPY (EGD) WITH PROPOFOL N/A 10/19/2020   Procedure: ESOPHAGOGASTRODUODENOSCOPY (EGD) WITH PROPOFOL;  Surgeon: Jackquline Denmark, MD;  Location: [facility] ENDOSCOPY;  Service: Endoscopy;  Laterality: N/A;   ESOPHAGOGASTRODUODENOSCOPY (EGD) WITH PROPOFOL N/A 12/22/2020   Procedure: ESOPHAGOGASTRODUODENOSCOPY (EGD) WITH PROPOFOL;  Surgeon: Gatha Mayer, MD;  Location: [place];  Service: Endoscopy;  Laterality: N/A;   ESOPHAGOGASTRODUODENOSCOPY (EGD) WITH PROPOFOL N/A 01/09/2021   Procedure: ESOPHAGOGASTRODUODENOSCOPY (EGD) WITH PROPOFOL;  Surgeon:  Irene Shipper, MD;  Location: [facility] ENDOSCOPY;  Service: Endoscopy;  Laterality: N/A;   HEMOSTASIS CLIP PLACEMENT  12/22/2019   Procedure: HEMOSTASIS CLIP PLACEMENT;  Surgeon: Lavena Bullion, DO;  Location: [place] ENDOSCOPY;  Service: Gastroenterology;;   HEMOSTASIS CLIP PLACEMENT  12/22/2020   Procedure: HEMOSTASIS CLIP PLACEMENT;  Surgeon: Gatha Mayer, MD;  Location: [facility] ENDOSCOPY;  Service: Endoscopy;;   HEMOSTASIS CONTROL  12/22/2020   Procedure: HEMOSTASIS CONTROL/hemospray;  Surgeon: Gatha Mayer, MD;  Location: [place];  Service: Endoscopy;;   HOT HEMOSTASIS N/A 02/08/2019   Procedure: HOT HEMOSTASIS (ARGON PLASMA COAGULATION/BICAP);  Surgeon: Milus Banister, MD;  Location: [facility] ENDOSCOPY;  Service: Gastroenterology;  Laterality: N/A;   HOT HEMOSTASIS N/A 12/22/2019   Procedure: HOT HEMOSTASIS (ARGON PLASMA COAGULATION/BICAP);  Surgeon: Lavena Bullion, DO;  Location: [facility] ENDOSCOPY;   Service: Gastroenterology;  Laterality: N/A;   HOT HEMOSTASIS N/A 10/19/2020   Procedure: HOT HEMOSTASIS (ARGON PLASMA COAGULATION/BICAP);  Surgeon: Jackquline Denmark, MD;  Location: [facility] ENDOSCOPY;  Service: Endoscopy;  Laterality: N/A;   HOT HEMOSTASIS N/A 12/22/2020   Procedure: HOT HEMOSTASIS (ARGON PLASMA COAGULATION/BICAP);  Surgeon: Gatha Mayer, MD;  Location: [facility] ENDOSCOPY;  Service: Endoscopy;  Laterality: N/A;   HOT HEMOSTASIS N/A 01/09/2021   Procedure: HOT HEMOSTASIS (ARGON PLASMA COAGULATION/BICAP);  Surgeon: Irene Shipper, MD;  Location: [facility] ENDOSCOPY;  Service: Endoscopy;  Laterality: N/A;   HOT HEMOSTASIS N/A 01/25/2021   Procedure: HOT HEMOSTASIS (ARGON PLASMA COAGULATION/BICAP);  Surgeon: Doran Stabler, MD;  Location: [place];  Service: Gastroenterology;  Laterality: N/A;   HOT HEMOSTASIS N/A 02/13/2021   Procedure: HOT HEMOSTASIS (ARGON PLASMA COAGULATION/BICAP);  Surgeon: Jackquline Denmark, MD;  Location: [facility] ENDOSCOPY;  Service: Endoscopy;  Laterality: N/A;   HOT HEMOSTASIS N/A 05/07/2021   Procedure: HOT HEMOSTASIS (ARGON PLASMA COAGULATION/BICAP);  Surgeon: Yetta Flock, MD;  Location: [facility] ENDOSCOPY;  Service: Gastroenterology;  Laterality: N/A;   INSERTION OF DIALYSIS CATHETER N/A 02/08/2022   Procedure: INSERTION OF TUNNELED DIALYSIS CATHETER;  Surgeon: Angelia Mould, MD;  Location: [place];  Service: Vascular;  Laterality: N/A;   INTERTROCHANTERIC HIP FRACTURE SURGERY Left 11/2006   [name] 03/08/2011   INTRAVASCULAR ULTRASOUND/IVUS N/A 12/19/2019   Procedure: Intravascular Ultrasound/IVUS;  Surgeon: Jettie Booze, MD;  Location: [place] CV LAB;  Service: Cardiovascular;  Laterality: N/A;   IR FLUORO GUIDE CV LINE LEFT  03/14/2022   IR FLUORO GUIDE CV LINE RIGHT  07/24/2019   IR FLUORO GUIDE CV LINE RIGHT  07/30/2019   IR US GUIDE VASC ACCESS RIGHT  07/24/2019   IR US GUIDE VASC ACCESS RIGHT  07/30/2019   LAPAROSCOPIC CHOLECYSTECTOMY  05/2006   LIGATION OF  ARTERIOVENOUS  FISTULA Left 02/08/2022   Procedure: LIGATION OF LEFT ARTERIOVENOUS  FISTULA;  Surgeon: Angelia Mould, MD;  Location: [place];  Service: Vascular;  Laterality: Left;   LIGATION OF COMPETING BRANCHES OF ARTERIOVENOUS FISTULA Left 11/05/2018   Procedure: LIGATION OF COMPETING BRANCHES OF ARTERIOVENOUS FISTULA  LEFT  ARM;  Surgeon: Marty Heck, MD;  [facility];  Service: Vascular;  Laterality: Left;   LUMBAR LAMINECTOMY/DECOMPRESSION MICRODISCECTOMY N/A 02/29/2016   Procedure: Left L4-5 Lateral Recess Decompression, Removal Extradural Intraspinal Facet Cyst;  Surgeon: Marybelle Killings, MD;  Location: [place];  Service: Orthopedics;  Laterality: N/A;   MULTIPLE TOOTH EXTRACTIONS     ORIF MANDIBULAR FRACTURE Left 08/13/2004   ORIF of left body fracture mandible with KLS Martin 2.3-mm six hole/notes 03/08/2011  RIGHT/LEFT HEART CATH AND CORONARY/GRAFT ANGIOGRAPHY N/A 12/19/2019   Procedure: RIGHT/LEFT HEART CATH AND CORONARY/GRAFT ANGIOGRAPHY;  Surgeon: Jettie Booze, MD;  Location: [place] CV LAB;  Service: Cardiovascular;  Laterality: N/A;   SCLEROTHERAPY  12/22/2019   Procedure: SCLEROTHERAPY;  Surgeon: Lavena Bullion, DO;  Location: [facility] ENDOSCOPY;  Service: Gastroenterology;;   Clide Deutscher  02/13/2021   Procedure: Clide Deutscher;  Surgeon: Jackquline Denmark, MD;  Location: [facility] ENDOSCOPY;  Service: Endoscopy;;     reports that he has been smoking cigarettes. He has a 21.50 pack-year smoking history. He has never been exposed to tobacco smoke. He has never used smokeless tobacco. He reports current alcohol use of about 12.0 standard drinks of alcohol per week. He reports that he does not currently use drugs after having used the following drugs: Cocaine.  Allergies  Allergen Reactions   Augmentin [Amoxicillin-Pot Clavulanate] Diarrhea and Other (See Comments)    Severe diarrhea   Morphine Sulfate Anaphylaxis    Can't mix with oxycodone    Mucinex Fast-Max Other  (See Comments)    Intense sweating    Amphetamines Other (See Comments)    Unknown reaction    Family History  Problem Relation Age of Onset   Heart failure Father    Hypertension Father    Diabetes Brother    Heart attack Brother    Alzheimer's disease Mother    Stroke Sister    Diabetes Sister    Alzheimer's disease Sister    Hypertension Brother    Diabetes Brother    Drug abuse Brother    Colon cancer Neg Hx     Prior to Admission medications   Medication Sig Start Date End Date Taking? Authorizing Provider  acetaminophen (TYLENOL) 650 MG CR tablet Take 1,300 mg by mouth 3 (three) times daily.    [provider]  allopurinol (ZYLOPRIM) 100 MG tablet Take one tablet by mouth once daily. 05/11/22   [name], MD  atorvastatin (LIPITOR) 10 MG tablet TAKE 1 TABLET(10 MG) BY MOUTH DAILY 02/14/22   [name], MD  B-D UF III MINI PEN NEEDLES 31G X 5 MM MISC USE 4 TIMES DAILY 06/06/22   [name], MD  BIKTARVY 50-200-25 MG TABS tablet Take 1 tablet by mouth daily. 03/10/22   [name], MD  Calcium Acetate 667 MG TABS Take 667-2,001 mg by mouth See admin instructions. Take 2001 mg by mouth three times a day with meals and 667 mg with each snack 01/20/21   [provider]  carvedilol (COREG) 3.125 MG tablet Take 1 tablet (3.125 mg total) by mouth 2 (two) times daily with a meal. CALL AND SCHEDULE 1 YEAR FOLLOW UP OFFICE VISIT TO RECEIVE FURTHER REFILLS. [phone]. 05/11/22   [name], MD  colchicine 0.6 MG tablet Take one tablet by mouth once daily as needed for gout. 03/28/22   [name], NP  diclofenac Sodium (VOLTAREN) 1 % GEL APPLY 2 GRAMS TOPICALLY TO THE AFFECTED AREA THREE TIMES DAILY AS NEEDED FOR PAIN 04/29/22   [name], MD  docusate sodium (COLACE) 100 MG capsule Take 100 mg by mouth daily as needed for mild constipation.    [provider]  folic acid (FOLVITE) 1 MG tablet TAKE 1 TABLET(1 MG) BY MOUTH  DAILY 05/11/22   [name], MD  gabapentin (NEURONTIN) 300 MG capsule TAKE 1 CAPSULE BY MOUTH DAILY. 05/11/22   [name], MD  glucose blood (ONETOUCH ULTRA) test strip  Check blood sugar three times daily E11.40 11/10/21   [name], MD  insulin lispro (HUMALOG) 100 UNIT/ML KwikPen INJECT 20 UNITS UNDER THE SKIN THREE TIMES DAILY AS NEEDED FOR HIGH BLOOD SUGAR(ABOVE 150) 06/09/22   [name], MD  isosorbide mononitrate (IMDUR) 30 MG 24 hr tablet Take 1 tablet (30 mg total) by mouth daily. 05/11/22   [name], MD  Lancets [facility] DELICA PLUS [id number]) MISC 1 Device by Other route 3 (three) times daily. E11.42 07/09/21   [name], NP  latanoprost (XALATAN) 0.005 % ophthalmic solution Place 1 drop into both eyes at bedtime.    [provider]  lidocaine-prilocaine (EMLA) cream Apply 1 application topically every Monday, Wednesday, and Friday with hemodialysis.    [provider]  Menthol-Camphor (TIGER BALM ARTHRITIS RUB) 11-11 % CREA Apply 1 Application topically daily as needed (pain).    [provider]  multivitamin (RENA-VIT) TABS tablet Take 1 tablet by mouth daily.    [provider]  nitroGLYCERIN (NITROSTAT) 0.3 MG SL tablet PLACE 1 TABLET UNDER THE TONGUE AS NEEDED FOR CHEST PAIN 03/29/22   [name], MD  octreotide (SANDOSTATIN LAR DEPOT) 10 MG injection Inject 10 mg into the muscle every 28 days. 04/21/22   Armbruster, Carlota Raspberry, MD  ondansetron (ZOFRAN) 4 MG tablet Take 1 tablet (4 mg total) by mouth every 8 (eight) hours as needed for nausea or vomiting. 06/15/21   [name], Dinah C, NP  oxyCODONE-acetaminophen (PERCOCET) 5-325 MG tablet Take 1 tablet by mouth every 6 (six) hours as needed for severe pain. 07/11/22 07/11/23  [name], MD  polyethylene glycol (MIRALAX / GLYCOLAX) 17 g packet Take 17 g by mouth daily as needed for mild constipation. 11/07/20   Aline August, MD  timolol (TIMOPTIC) 0.5  % ophthalmic solution Place 1 drop into both eyes in the morning. 04/04/19   [provider]  umeclidinium-vilanterol [facility] ELLIPTA) 62.5-25 MCG/ACT AEPB INHALE 1 PUFF BY MOUTH EVERY DAY 05/18/22   [name], MD    Physical Exam: Vitals:   07/18/22 1539 07/18/22 1601 07/18/22 1615 07/18/22 1630  BP: (!) 146/78 130/69 124/67 132/66  Pulse: 82 75  69  Resp: '16 16 17 13  '$ Temp:  97.6 F (36.4 C)    TempSrc:  Oral    SpO2: 95% 100%  100%  Weight:      Height:        Constitutional: NAD, calm, comfortable Vitals:   07/18/22 1539 07/18/22 1601 07/18/22 1615 07/18/22 1630  BP: (!) 146/78 130/69 124/67 132/66  Pulse: 82 75  69  Resp: '16 16 17 13  '$ Temp:  97.6 F (36.4 C)    TempSrc:  Oral    SpO2: 95% 100%  100%  Weight:      Height:       Eyes: PERRL, lids and conjunctivae normal ENMT: Mucous membranes are moist. Posterior pharynx clear of any exudate or lesions.Normal dentition.  Neck: normal, supple, no masses, no thyromegaly Respiratory: clear to auscultation bilaterally, no wheezing, no crackles. Normal respiratory effort. No accessory muscle use.  Cardiovascular: Regular rate and rhythm, no murmurs / rubs / gallops. No extremity edema. 2+ pedal pulses. No carotid bruits.  Abdomen: no tenderness, no masses palpated. No hepatosplenomegaly. Bowel sounds positive.  Musculoskeletal: no clubbing / cyanosis. No joint deformity upper and lower extremities. Good ROM, no contractures. Normal muscle tone.  Skin: no rashes, lesions, ulcers. No induration Neurologic: CN 2-12  grossly intact. Sensation intact, DTR normal. Strength 5/5 in all 4.  Psychiatric: Normal judgment and insight. Alert and oriented x 3. Normal mood.    Labs on Admission: I have personally reviewed following labs and imaging studies  CBC: Recent Labs  Lab 07/18/22 1240  WBC 7.4  NEUTROABS 4.2  HGB 9.5*  HCT 29.9*  MCV 106.8*  PLT 573   Basic Metabolic Panel: Recent Labs  Lab 07/18/22 1240   NA 140  K 6.5*  CL 93*  CO2 26  GLUCOSE 87  BUN 144*  CREATININE 15.45*  CALCIUM 8.6*   GFR: Estimated Creatinine Clearance: 4.3 mL/min (A) (by C-G formula based on SCr of 15.45 mg/dL (H)). Liver Function Tests: Recent Labs  Lab 07/18/22 1240  AST 34  ALT 23  ALKPHOS 88  BILITOT 0.7  PROT 7.6  ALBUMIN 3.1*   Recent Labs  Lab 07/18/22 1240  LIPASE 31   No results for input(s): "AMMONIA" in the last 168 hours. Coagulation Profile: No results for input(s): "INR", "PROTIME" in the last 168 hours. Cardiac Enzymes: No results for input(s): "CKTOTAL", "CKMB", "CKMBINDEX", "TROPONINI" in the last 168 hours. BNP (last 3 results) No results for input(s): "PROBNP" in the last 8760 hours. HbA1C: No results for input(s): "HGBA1C" in the last 72 hours. CBG: No results for input(s): "GLUCAP" in the last 168 hours. Lipid Profile: No results for input(s): "CHOL", "HDL", "LDLCALC", "TRIG", "CHOLHDL", "LDLDIRECT" in the last 72 hours. Thyroid Function Tests: No results for input(s): "TSH", "T4TOTAL", "FREET4", "T3FREE", "THYROIDAB" in the last 72 hours. Anemia Panel: No results for input(s): "VITAMINB12", "FOLATE", "FERRITIN", "TIBC", "IRON", "RETICCTPCT" in the last 72 hours. Urine analysis:    Component Value Date/Time   COLORURINE YELLOW 10/16/2020 2136   APPEARANCEUR CLEAR 10/16/2020 2136   LABSPEC 1.016 10/16/2020 2136   PHURINE 5.0 10/16/2020 2136   GLUCOSEU NEGATIVE 10/16/2020 2136   GLUCOSEU NEG mg/dL 09/20/2010 2058   HGBUR SMALL (A) 10/16/2020 2136   HGBUR negative 05/31/2010 0948   BILIRUBINUR NEGATIVE 10/16/2020 2136   [place] NEGATIVE 10/16/2020 2136   PROTEINUR 30 (A) 10/16/2020 2136   UROBILINOGEN 0.2 06/24/2015 2336   NITRITE NEGATIVE 10/16/2020 2136   LEUKOCYTESUR NEGATIVE 10/16/2020 2136    Radiological Exams on Admission: DG Foot Complete Right  Result Date: 07/18/2022 CLINICAL DATA:  Diabetic wound, assess for osteomyelitis EXAM: RIGHT FOOT  COMPLETE - 3 VIEW COMPARISON:  Right foot radiograph dated November 16, 2021 FINDINGS: New lucency and irregularity of the distal fourth metatarsal and base of the proximal right phalanx. Moderate degenerative changes of the midfoot and great right toe metatarsal joint. Vascular calcifications. Soft tissues are unremarkable. IMPRESSION: New lucency and irregularity of the distal fourth metatarsal and base of the proximal right phalanx, highly concerning for osteomyelitis. Could be further evaluated with MRI. Electronically Signed   By: Yetta Glassman M.D.   On: 07/18/2022 13:59   DG Chest 2 View  Result Date: 07/18/2022 CLINICAL DATA:  Shortness of breath EXAM: CHEST - 2 VIEW COMPARISON:  Chest radiograph June 28, 2022 FINDINGS: No pleural effusion. No pneumothorax. Postsurgical changes from median sternotomy and CABG. Aortic atherosclerotic calcifications. Interval removal of the left-sided tunneled central venous catheter. There is pulmonary venous congestion without evidence of overt pulmonary edema. Cardiomegaly. Chronic healed right-sided rib fractures. Degenerative changes of the right AC joint and glenohumeral joint. IMPRESSION: 1. Pulmonary venous congestion without evidence of overt pulmonary edema. 2.  No focal airspace opacity. Electronically Signed   By: Madison Hickman  Shearon Stalls M.D.   On: 07/18/2022 13:46    EKG: Independently reviewed.  Sinus rhythm with first-degree AV block, right bundle branch block, left atrial enlargement.  Assessment/Plan Principal Problem:   Osteomyelitis ([place]) Active Problems:   Human immunodeficiency virus (HIV) disease ([place])   Essential hypertension   Coronary artery disease involving native heart without angina pectoris   Hyperkalemia   HTN (hypertension)   Anemia of chronic disease   Hyperlipidemia associated with type 2 diabetes mellitus ([place])   Diabetic foot ulcer ([place])   ESRD on hemodialysis ([place])   Right foot osteomyelitis: Waiting for podiatry to  provide further instructions, holding off antibiotics in case they decide to do biopsy first.  We will consult wound care.  ESRD on HD, MWF schedule/hyperkalemia: Last hemodialysis on Wednesday. Potassium 6.5. nephrology has been consulted.  Will benefit from dialysis.  Type 2 diabetes mellitus: Last hemoglobin A1c 3 months ago on 03/22/2022 was 5.9.  Patient does not appear to be on any home medications for diabetes.  We will start him on SSI.  Anemia of chronic disease: Hemoglobin is stable.  Hyperlipidemia: Resume atorvastatin.  History of CAD with hypertension, essential: Asymptomatic.  Resume Imdur, Coreg.  HIV: Resume Biktarvy.  Peripheral neuropathy: Resume Neurontin.  DVT prophylaxis: heparin injection 5,000 Units Start: 07/18/22 1730 Code Status: Full code Family Communication: None present at bedside.  Plan of care discussed with patient in length and he verbalized understanding and agreed with it. Disposition Plan: To be determined. Consults called: Nephrology and podiatry  Darliss Cheney MD [facility]  *Please note that this is a verbal dictation therefore any spelling or grammatical errors are due to the "[place] [employer]" system interpretation.  Please page via [place] and do not message via secure chat for urgent patient care matters. Secure chat can be used for non urgent patient care matters. 07/18/2022, 5:16 PM  To contact the attending provider between 7A-7P or the covering provider during after hours 7P-7A, please log into the web site [email or web address]

## 2022-07-18 NOTE — ED Notes (Signed)
Pt transported to MRI 

## 2022-07-18 NOTE — ED Triage Notes (Signed)
Reports dx with right foot diabetic ulcer and now has swelling to ankle, hasnt gone to dialysis since last Wednesday.  Also reports dark stool.  Also reports fall at home last night and complains butt pain.  Dialysis M w f

## 2022-07-18 NOTE — ED Notes (Signed)
Pt still in MRI at this time

## 2022-07-19 ENCOUNTER — Other Ambulatory Visit (HOSPITAL_COMMUNITY): Payer: Self-pay

## 2022-07-19 ENCOUNTER — Ambulatory Visit (INDEPENDENT_AMBULATORY_CARE_PROVIDER_SITE_OTHER): Payer: Medicare Other | Admitting: Podiatry

## 2022-07-19 DIAGNOSIS — M86671 Other chronic osteomyelitis, right ankle and foot: Secondary | ICD-10-CM

## 2022-07-19 DIAGNOSIS — Z91199 Patient's noncompliance with other medical treatment and regimen due to unspecified reason: Secondary | ICD-10-CM

## 2022-07-19 DIAGNOSIS — L97514 Non-pressure chronic ulcer of other part of right foot with necrosis of bone: Secondary | ICD-10-CM

## 2022-07-19 DIAGNOSIS — M86071 Acute hematogenous osteomyelitis, right ankle and foot: Secondary | ICD-10-CM | POA: Diagnosis not present

## 2022-07-19 LAB — COMPREHENSIVE METABOLIC PANEL
ALT: 22 U/L (ref 0–44)
AST: 33 U/L (ref 15–41)
Albumin: 3.1 g/dL — ABNORMAL LOW (ref 3.5–5.0)
Alkaline Phosphatase: 100 U/L (ref 38–126)
Anion gap: 13 (ref 5–15)
BUN: 60 mg/dL — ABNORMAL HIGH (ref 8–23)
CO2: 27 mmol/L (ref 22–32)
Calcium: 8.4 mg/dL — ABNORMAL LOW (ref 8.9–10.3)
Chloride: 94 mmol/L — ABNORMAL LOW (ref 98–111)
Creatinine, Ser: 8.25 mg/dL — ABNORMAL HIGH (ref 0.61–1.24)
GFR, Estimated: 6 mL/min — ABNORMAL LOW (ref >60)
Glucose, Bld: 142 mg/dL — ABNORMAL HIGH (ref 70–99)
Potassium: 4.7 mmol/L (ref 3.5–5.1)
Sodium: 134 mmol/L — ABNORMAL LOW (ref 135–145)
Total Bilirubin: 1.2 mg/dL (ref 0.3–1.2)
Total Protein: 7.7 g/dL (ref 6.5–8.1)

## 2022-07-19 LAB — CBC
HCT: 29.9 % — ABNORMAL LOW (ref 39.0–52.0)
Hemoglobin: 9.9 g/dL — ABNORMAL LOW (ref 13.0–17.0)
MCH: 34.1 pg — ABNORMAL HIGH (ref 26.0–34.0)
MCHC: 33.1 g/dL (ref 30.0–36.0)
MCV: 103.1 fL — ABNORMAL HIGH (ref 80.0–100.0)
Platelets: 171 10*3/uL (ref 150–400)
RBC: 2.9 MIL/uL — ABNORMAL LOW (ref 4.22–5.81)
RDW: 13.4 % (ref 11.5–15.5)
WBC: 5.9 10*3/uL (ref 4.0–10.5)
nRBC: 0 % (ref 0.0–0.2)

## 2022-07-19 LAB — CBG MONITORING, ED
Glucose-Capillary: 143 mg/dL — ABNORMAL HIGH (ref 70–99)
Glucose-Capillary: 132 mg/dL — ABNORMAL HIGH (ref 70–99)

## 2022-07-19 LAB — GLUCOSE, CAPILLARY: Glucose-Capillary: 182 mg/dL — ABNORMAL HIGH (ref 70–99)

## 2022-07-19 MED ORDER — PANTOPRAZOLE SODIUM 40 MG IV SOLR
40.0000 mg | Freq: Two times a day (BID) | INTRAVENOUS | Status: DC
  Administered 2022-07-19 – 2022-07-22 (×6): 40 mg via INTRAVENOUS
  Filled 2022-07-19 (×6): qty 10

## 2022-07-19 NOTE — ED Notes (Signed)
Pt transported to bathroom via wheelchair by this RN. Bowel movement dark in color

## 2022-07-19 NOTE — Progress Notes (Signed)
PROGRESS NOTE    Jacob [name]  HQI:[id number] DOB: 1951-04-21 DOA: 07/18/2022 PCP: [name], MD   Brief Narrative:  HPI: Jacob [name] is a 71 y.o. male with medical history significant of ESRD on HD, diminutive Friday, HIV, type 2 diabetes mellitus with right foot diabetic wound, hypertension and several other comorbidities who presented to ED with a complaint of worsening swelling, pain and infection of the right foot diabetic ulcer.  Per patient, he has been having this diabetic foot ulcer since about 19 weeks and has been getting wound care as outpatient.  He has been told to not put too much weight on the right foot and has diabetic shoes as well but since his scooter has not arrived yet, he has not been able to comply with instructions resulting in worsening pain and wound.  He denies any fever, chills, sweating or any other complaint.  The last time he was seen by wound care was on 07/14/2022.  He is not on any antibiotics.  Apart from that, patient tells [place] that due to inability to walk well, he has not received his dialysis since Wednesday last week.  He denies any shortness of breath.   ED Course: Upon arrival to ED, he was hemodynamically stable.  Right foot x-ray was done which shows suspicion of osteomyelitis of the distal fourth metatarsal and base of the proximal right phalanx of the right foot.  MRI is pending.  Was found to have hyperkalemia.  Nephrology has been consulted by ED.  Per ED provider, podiatry has been paged out but they are waiting for callback.  Patient has not been provided with antibiotics yet since ED provider is waiting for instructions from podiatry.    Assessment & Plan:   Principal Problem:   Osteomyelitis ([place]) Active Problems:   Human immunodeficiency virus (HIV) disease ([place])   Essential hypertension   Coronary artery disease involving native heart without angina pectoris   Hyperkalemia   HTN (hypertension)   Anemia of chronic disease    Hyperlipidemia associated with type 2 diabetes mellitus ([place])   Diabetic foot ulcer ([place])   ESRD on hemodialysis ([place])  Right foot osteomyelitis: I was informed at 8 PM last night on 07/18/2022 by Dr. Vanita Panda of ED that podiatry had called back and requested to initiate antibiotics and patient was started on antibiotics and that they will see today.  However as of 2:22 PM when patient was not seen by podiatry, I called podiatry on-call and spoke to Dr. Loel Lofty who mentioned that they are not even aware of the patient.  Nonetheless, he said he will see the patient.  We will defer further management to them.  Anemia of chronic disease: Patient's hemoglobin appears to be stable between 9 and 11.  He did mention that he was having black stools.  FOBT is positive as well but however hemoglobin is a stable so I will just start him on IV twice daily Protonix for now.  Monitor his H&H closely.  If continues to drop, will have GI consult.   ESRD on HD, MWF schedule/hyperkalemia: He did have dialysis on the same night.  Potassium normal.  Nephrology on board.   Type 2 diabetes mellitus: Last hemoglobin A1c 3 months ago on 03/22/2022 was 5.9.  Patient does not appear to be on any home medications for diabetes.  Continue SSI.   Anemia of chronic disease: Hemoglobin is stable.   Hyperlipidemia: Resume atorvastatin.   History of CAD with hypertension,  essential: Asymptomatic.  Continue Imdur, Coreg.   HIV: Continue Biktarvy.   Peripheral neuropathy: Continue Neurontin.  DVT prophylaxis: heparin injection 5,000 Units Start: 07/18/22 2000   Code Status: Full Code  Family Communication:  None present at bedside.  Plan of care discussed with patient in length and he/she verbalized understanding and agreed with it.  Status is: Inpatient Remains inpatient appropriate because: To be assessed by podiatry.   Estimated body mass index is 27.25 kg/m as calculated from the following:   Height as of this  encounter: '5\' 9"'$  (1.753 m).   Weight as of this encounter: 83.7 kg.    Nutritional Assessment: Body mass index is 27.25 kg/m.[name] Seen by dietician.  I agree with the assessment and plan as outlined below: Nutrition Status:        . Skin Assessment: I have examined the patient's skin and I agree with the wound assessment as performed by the wound care RN as outlined below:    Consultants:  Podiatry Nephrology  Procedures:  None  Antimicrobials:  Anti-infectives (From admission, onward)    Start     Dose/Rate Route Frequency Ordered Stop   07/20/22 1200  vancomycin (VANCOCIN) IVPB 1000 mg/200 mL premix        1,000 mg 200 mL/hr over 60 Minutes Intravenous Every M-W-F (Hemodialysis) 07/18/22 2146     07/19/22 1000  bictegravir-emtricitabine-tenofovir AF (BIKTARVY) 50-200-25 MG per tablet 1 tablet        1 tablet Oral Daily 07/18/22 1716     07/18/22 2330  vancomycin (VANCOCIN) IVPB 1000 mg/200 mL premix  Status:  Discontinued        1,000 mg 200 mL/hr over 60 Minutes Intravenous Every M-W-F (Hemodialysis) 07/18/22 2006 07/18/22 2146   07/18/22 2000  ceFEPIme (MAXIPIME) 1 g in sodium chloride 0.9 % 100 mL IVPB        1 g 200 mL/hr over 30 Minutes Intravenous Every 24 hours 07/18/22 1941     07/18/22 2000  vancomycin (VANCOREADY) IVPB 1750 mg/350 mL        1,750 mg 175 mL/hr over 120 Minutes Intravenous  Once 07/18/22 1941 07/19/22 0629   07/18/22 1930  metroNIDAZOLE (FLAGYL) IVPB 500 mg        500 mg 100 mL/hr over 60 Minutes Intravenous Every 12 hours 07/18/22 1917 07/25/22 2159         Subjective: This morning I received a message by patient's nurse that patient was confused and refused medications and called 911 and I was asked to come see patient immediately at the bedside.  When I went there, patient was fully alert and oriented and very calm.  I brought the nurse with me at the bedside who also verified that patient was alert and oriented at that time.  When asked  from the patient if he ever called 911, he said he has he did only because he does not think he is being treated as a patient should be treated in the hospital.  He said that in front of the nurses.  In my opinion, he was not disoriented or confused but in fact he was upset and that is why he called 911 because he wanted to get out of here.  At some point in time, he was ready to sign [place] however his friend came and possibly talked into him.  Objective: Vitals:   07/19/22 1000 07/19/22 1030 07/19/22 1100 07/19/22 1300  BP: 122/82 (!) 135/91 131/71 132/71  Pulse:  87 89 78  Resp: '14 16 14 16  '$ Temp:    97.9 F (36.6 C)  TempSrc:    Oral  SpO2:  97% 96% 95%  Weight:    83.7 kg  Height:    '5\' 9"'$  (1.753 m)    Intake/Output Summary (Last 24 hours) at 07/19/2022 1422 Last data filed at 07/19/2022 1245 Gross per 24 hour  Intake 550.23 ml  Output 1001 ml  Net -450.77 ml   Filed Weights   07/18/22 2115 07/19/22 0145 07/19/22 1300  Weight: 83.9 kg 82.8 kg 83.7 kg    Examination:  General exam: Appears calm and comfortable  Respiratory system: Clear to auscultation. Respiratory effort normal. Cardiovascular system: S1 & S2 heard, RRR. No JVD, murmurs, rubs, gallops or clicks. No pedal edema. Gastrointestinal system: Abdomen is nondistended, soft and nontender. No organomegaly or masses felt. Normal bowel sounds heard. Central nervous system: Alert and oriented. No focal neurological deficits. Extremities: Diabetic foot ulcer in the right foot. Psychiatry: Judgement and insight appear normal. Mood & affect appropriate.    Data Reviewed: I have personally reviewed following labs and imaging studies  CBC: Recent Labs  Lab 07/18/22 1240 07/18/22 1715 07/19/22 0336  WBC 7.4 6.3 5.9  NEUTROABS 4.2  --   --   HGB 9.5* 10.3* 9.9*  HCT 29.9* 31.0* 29.9*  MCV 106.8* 104.4* 103.1*  PLT 195 197 809   Basic Metabolic Panel: Recent Labs  Lab 07/18/22 1240  07/18/22 1715 07/19/22 0336  NA 140  --  134*  K 6.5*  --  4.7  CL 93*  --  94*  CO2 26  --  27  GLUCOSE 87  --  142*  BUN 144*  --  60*  CREATININE 15.45* 15.58* 8.25*  CALCIUM 8.6*  --  8.4*  MG  --  2.7*  --    GFR: Estimated Creatinine Clearance: 8 mL/min (A) (by C-G formula based on SCr of 8.25 mg/dL (H)). Liver Function Tests: Recent Labs  Lab 07/18/22 1240 07/19/22 0336  AST 34 33  ALT 23 22  ALKPHOS 88 100  BILITOT 0.7 1.2  PROT 7.6 7.7  ALBUMIN 3.1* 3.1*   Recent Labs  Lab 07/18/22 1240  LIPASE 31   No results for input(s): "AMMONIA" in the last 168 hours. Coagulation Profile: No results for input(s): "INR", "PROTIME" in the last 168 hours. Cardiac Enzymes: No results for input(s): "CKTOTAL", "CKMB", "CKMBINDEX", "TROPONINI" in the last 168 hours. BNP (last 3 results) No results for input(s): "PROBNP" in the last 8760 hours. HbA1C: No results for input(s): "HGBA1C" in the last 72 hours. CBG: Recent Labs  Lab 07/19/22 0334 07/19/22 0802  GLUCAP 143* 132*   Lipid Profile: No results for input(s): "CHOL", "HDL", "LDLCALC", "TRIG", "CHOLHDL", "LDLDIRECT" in the last 72 hours. Thyroid Function Tests: No results for input(s): "TSH", "T4TOTAL", "FREET4", "T3FREE", "THYROIDAB" in the last 72 hours. Anemia Panel: No results for input(s): "VITAMINB12", "FOLATE", "FERRITIN", "TIBC", "IRON", "RETICCTPCT" in the last 72 hours. Sepsis Labs: Recent Labs  Lab 07/18/22 1240  LATICACIDVEN 1.6    Recent Results (from the past 240 hour(s))  Blood Cultures x 2 sites     Status: None (Preliminary result)   Collection Time: 07/18/22  8:00 PM   Specimen: BLOOD LEFT FOREARM  Result Value Ref Range Status   Specimen Description BLOOD LEFT FOREARM  Final   Special Requests   Final    BOTTLES DRAWN AEROBIC AND ANAEROBIC Blood Culture results may not  be optimal due to an inadequate volume of blood received in culture bottles   Culture   Final    NO GROWTH < 12  HOURS Performed at [facility] [address], [place], [place] [address]    Report Status PENDING  Incomplete  Blood Cultures x 2 sites     Status: None (Preliminary result)   Collection Time: 07/18/22  8:10 PM   Specimen: BLOOD LEFT HAND  Result Value Ref Range Status   Specimen Description BLOOD LEFT HAND  Final   Special Requests   Final    BOTTLES DRAWN AEROBIC AND ANAEROBIC Blood Culture results may not be optimal due to an excessive volume of blood received in culture bottles   Culture   Final    NO GROWTH < 12 HOURS Performed at [facility], [place] [address], [place], [place] [address]    Report Status PENDING  Incomplete     Radiology Studies: DG Foot Complete Right  Result Date: 07/19/2022 Please see detailed radiograph report in office note.  MR FOOT RIGHT WO CONTRAST  Result Date: 07/18/2022 CLINICAL DATA:  Swelling of the foot. End-stage renal disease. Type 2 diabetes. Wound along the foot. EXAM: MRI OF THE RIGHT FOREFOOT WITHOUT CONTRAST TECHNIQUE: Multiplanar, multisequence MR imaging of the right forefoot was performed. No intravenous contrast was administered. COMPARISON:  Radiographs 07/18/2022 FINDINGS: Bones/Joint/Cartilage Abnormal edema signal in bony destructive findings in the distal metaphysis and distal head of the fourth metatarsal and in the proximal phalanx of the fourth digit, most compatible with osteomyelitis. There is some deformity along the distal metaphysis of the fourth metatarsal which could indicate a superimposed fracture, but the erosive appearance and involvement of the proximal phalanx strongly favors infection. Plantar overlying cutaneous wound noted, possibly draining from a small amount of fluid along the volar margin of the distal fourth metatarsal as on images 20 through 21 of series 5 and images 25-26 of series 8. No definite osteomyelitis elsewhere in the forefoot. There are degenerative findings at the first metatarsal head,  with suspected medial erosion which can be seen in the setting of gout. Degenerative findings at the articulation with the first digit sesamoids also noted. Ligaments Lisfranc ligament intact. Muscles and Tendons Diffuse regional muscular edema, probably neurogenic. Soft tissues Diffuse circumferential subcutaneous edema in the foot, cellulitis not excluded. Small plantar wound below the distal fourth metatarsal, cannot exclude drainage site. IMPRESSION: 1. Osteomyelitis involving the fourth metatarsal distally as well as the proximal phalanx of the fourth digit. Possible plantar drainage from the distal fourth metatarsal given the overlying wound. 2. Diffuse subcutaneous edema in the forefoot, cellulitis not excluded. 3. Diffuse regional muscular edema, probably neurogenic. 4. Chronic appearing medial erosion along the first metatarsal head, possibly from gout. Electronically Signed   By: Van Clines M.D.   On: 07/18/2022 19:02   DG Foot Complete Right  Result Date: 07/18/2022 CLINICAL DATA:  Diabetic wound, assess for osteomyelitis EXAM: RIGHT FOOT COMPLETE - 3 VIEW COMPARISON:  Right foot radiograph dated November 16, 2021 FINDINGS: New lucency and irregularity of the distal fourth metatarsal and base of the proximal right phalanx. Moderate degenerative changes of the midfoot and great right toe metatarsal joint. Vascular calcifications. Soft tissues are unremarkable. IMPRESSION: New lucency and irregularity of the distal fourth metatarsal and base of the proximal right phalanx, highly concerning for osteomyelitis. Could be further evaluated with MRI. Electronically Signed   By: Yetta Glassman M.D.   On: 07/18/2022  13:59   DG Chest 2 View  Result Date: 07/18/2022 CLINICAL DATA:  Shortness of breath EXAM: CHEST - 2 VIEW COMPARISON:  Chest radiograph June 28, 2022 FINDINGS: No pleural effusion. No pneumothorax. Postsurgical changes from median sternotomy and CABG. Aortic atherosclerotic  calcifications. Interval removal of the left-sided tunneled central venous catheter. There is pulmonary venous congestion without evidence of overt pulmonary edema. Cardiomegaly. Chronic healed right-sided rib fractures. Degenerative changes of the right AC joint and glenohumeral joint. IMPRESSION: 1. Pulmonary venous congestion without evidence of overt pulmonary edema. 2.  No focal airspace opacity. Electronically Signed   By: Marin Roberts M.D.   On: 07/18/2022 13:46    Scheduled Meds:  atorvastatin  10 mg Oral Daily   bictegravir-emtricitabine-tenofovir AF  1 tablet Oral Daily   calcitRIOL  1 mcg Oral Q M,W,F   calcium acetate  2,001 mg Oral TID WC   carvedilol  3.125 mg Oral BID WC   Chlorhexidine Gluconate Cloth  6 each Topical Q0600   [START ON 07/20/2022] cinacalcet  30 mg Oral Q M,W,F-HD   folic acid  1 mg Oral Daily   gabapentin  300 mg Oral BID   heparin  5,000 Units Subcutaneous Q8H   insulin aspart  0-15 Units Subcutaneous TID WC   insulin aspart  0-5 Units Subcutaneous QHS   isosorbide mononitrate  30 mg Oral Daily   latanoprost  1 drop Both Eyes QHS   sodium chloride flush  3 mL Intravenous Q12H   timolol  1 drop Both Eyes q AM   Continuous Infusions:  ceFEPime (MAXIPIME) IV Stopped (07/19/22 0243)   metronidazole Stopped (07/19/22 0424)   [START ON 07/20/2022] vancomycin       LOS: 1 day   Darliss Cheney, MD [facility]  07/19/2022, 2:22 PM   *Please note that this is a verbal dictation therefore any spelling or grammatical errors are due to the "[place]" system interpretation.  Please page via [place] and do not message via secure chat for urgent patient care matters. Secure chat can be used for non urgent patient care matters.  How to contact the [facility] Attending or Consulting provider [place] or covering provider during after hours [place], for this patient?  Check the care team in [facility] and look for a) attending/consulting [facility] provider listed and b) the  [facility] team listed. Page or secure chat [phone]. Log into [email or web address] and use [place]'s universal password to access. If you do not have the password, please contact the hospital operator. Locate the [facility] provider you are looking for under [facility] and page to a number that you can be directly reached. If you still have difficulty reaching the provider, please page the [facility] (Director on Call) for the Hospitalists listed on amion for assistance.

## 2022-07-19 NOTE — ED Notes (Signed)
Patient called out to RN from the bed. Patient reports to RN that he wants to leave and that he does not like everyone walking by and staring at him. Patient reports that his sister talked to him on the phone and is embarrassed by him being held hostage at the [facility]. RN informed patient that he is in the [facility], not [facility]. Patient states that it looks like a department store or something. Patient reports he has been here since last night after having a tooth pulled. Patient unaware of current diagnosis and reason for staying in the hospital. Patient also reports wanting to leave since there are no beds for him.

## 2022-07-19 NOTE — ED Notes (Signed)
Pt calling 911 multiple times mumbling on the phone. CN aware

## 2022-07-19 NOTE — Progress Notes (Signed)
   07/19/22 0145  Vitals  Temp 98.8 F (37.1 C)  BP (!) 151/76  MAP (mmHg) 97  BP Location Left Arm  BP Method Automatic  Patient Position (if appropriate) Lying  Pulse Rate 95  Pulse Rate Source Monitor  ECG Heart Rate 94  Resp 18  Post Treatment  Dialyzer Clearance Heavily streaked  Duration of HD Treatment -hour(s) 3.5 hour(s)  Liters Processed 83  Fluid Removed 1001 mL  Tolerated HD Treatment Yes  Post-Hemodialysis Comments Had some confusion as to when  he's  to go home, and called 911.   TX fin. W/ some confusion of of when to go home, and calling 911.

## 2022-07-19 NOTE — Progress Notes (Addendum)
At 1926, patient stated that he needs to go home and he wants to speak with the doctor, He is claiming he doesn't know why he is here and why he needs antibiotics. Explained to him about his foot wound but he still insisted on leaving.  At [place] patient is claiming that his sisters were never informed where he is and why he is here, states he will never have surgery as he didn't agree on this.  At 2015, spoke to [place], patient's sister, and explained that patient wants to go home, sister said she will call him.  At 2027, per Rosann Auerbach, sister of patient, the patient is not listening to her even though she explained that doctors and staff have spoken to her while he was in ED. Per sister, if patient wants to go home it is on him because she already explained that he might lose his foot.  At 2030 paged on call Dr. Cyd Silence about situation.

## 2022-07-19 NOTE — ED Notes (Signed)
Pahwani MD at bedside to assess pt

## 2022-07-19 NOTE — ED Notes (Addendum)
Pt is very confuse this morning, he is alert times 2. (Self and time). Pt trying to make a phone call from his cell phone by using the camera function. Pt is irritable and uncooperative, also refusing his medications.This is not pt baseline from when this RN saw pt last night at 1900. Provider notified.

## 2022-07-19 NOTE — ED Notes (Signed)
Pt has called 911 multiple times. He called his friend to come get him and his friend is at bedside. Pt is still irritable and denying that he is confused and keeps asking who Velva Harman is on his dialysis consent. Pt claiming that Velva Harman forged his signature on the papers. Pt's friend is concerned for his mental status and confusion. Notified Pahwani MD

## 2022-07-19 NOTE — ED Notes (Signed)
Med rec tech at bedside. Pt irritable. Pt's friend no longer at bedside. This RN explained that Med rec tech just wanted to go over what medications he is on and when he last took them. Pt stated, "I haven't taken any since last night." This RN explained to pt that he refused his morning meds on 2 separate occasions. Pt argumentative and accused this RN of lying.

## 2022-07-19 NOTE — Progress Notes (Addendum)
[facility] Progress Note   Assessment/ Plan:   Dialysis Orders: Center:[facility] MWF 3 hr 45 min 180NRe 400/Autoflow 1.5 82 kg 2.0 K/2.0 Ca AVG -No heparin -Mircera 75 mcg IV q 2 weeks (last dose 07/06/2022) -Calcitriol 1.0 mcg PO TIW -Sensipar 30 mg PO TIW   Assessment/Plan:  Volume overload in setting of missed HD-Vascular congestion on CXR with LE edema. S/p HD 9/25, next HD 9/27  Hyperkalemia-Given lokelma in ED. Use 2.0 K bath with HD, resolved  Osteomyelitis R foot-W/U per primary. Podiatry consulted. On vanc/ cefepime.  Possible GIB-BUN elevated. Azotemia without signs of uremia. Reports black stools. HGB 11.0 07/13/2022 down to 9.5 today. FOBT positive.  Per primary  ESRD -  MWF-missed HD 07/15/2022. HD 9/25, next HD 9/27.  NO HEPARIN.   Hypertension/volume  - As noted above. BP well controlled. Low dose carvedilol on OP med list. Has been ordered.   Anemia  - As noted above. ESA due 07/20/2022. Will order. Follow HGB. Transfuse as needed.   Metabolic bone disease -  OP PO4 at goal. Continue binders, VDRA & Sensipar.  Nutrition - Albumin low. Changed to renal/carb mod diet with protein supplements, nepro  DMT2-per primary HIV-meds per primary    Subjective:    Seen in room- on BSC   Objective:   BP 132/71 (BP Location: Left Arm)   Pulse 78   Temp 97.9 F (36.6 C) (Oral)   Resp 16   Ht '5\' 9"'$  (1.753 m)   Wt 83.7 kg   SpO2 95%   BMI 27.25 kg/m   Physical Exam: Gen:NAD CVS:RRR Resp: clear Abd: NABS Ext: no LE edema. R sided ulcer ACCESS: R AVG + T/B  Labs: BMET Recent Labs  Lab 07/18/22 1240 07/18/22 1715 07/19/22 0336  NA 140  --  134*  K 6.5*  --  4.7  CL 93*  --  94*  CO2 26  --  27  GLUCOSE 87  --  142*  BUN 144*  --  60*  CREATININE 15.45* 15.58* 8.25*  CALCIUM 8.6*  --  8.4*   CBC Recent Labs  Lab 07/18/22 1240 07/18/22 1715 07/19/22 0336  WBC 7.4 6.3 5.9  NEUTROABS 4.2  --   --   HGB 9.5* 10.3* 9.9*  HCT 29.9* 31.0* 29.9*   MCV 106.8* 104.4* 103.1*  PLT 195 197 171      Medications:     atorvastatin  10 mg Oral Daily   bictegravir-emtricitabine-tenofovir AF  1 tablet Oral Daily   calcitRIOL  1 mcg Oral Q M,W,F   calcium acetate  2,001 mg Oral TID WC   carvedilol  3.125 mg Oral BID WC   Chlorhexidine Gluconate Cloth  6 each Topical Q0600   [START ON 07/20/2022] cinacalcet  30 mg Oral Q M,W,F-HD   folic acid  1 mg Oral Daily   gabapentin  300 mg Oral BID   heparin  5,000 Units Subcutaneous Q8H   insulin aspart  0-15 Units Subcutaneous TID WC   insulin aspart  0-5 Units Subcutaneous QHS   isosorbide mononitrate  30 mg Oral Daily   latanoprost  1 drop Both Eyes QHS   pantoprazole (PROTONIX) IV  40 mg Intravenous Q12H   sodium chloride flush  3 mL Intravenous Q12H   timolol  1 drop Both Eyes q AM     Madelon Lips MD 07/19/2022, 3:34 PM

## 2022-07-19 NOTE — Progress Notes (Signed)
HOSPITAL MEDICINE OVERNIGHT EVENT NOTE    Notified by nursing that patient is stating that he wishes to be discharged.  I went to go evaluate the patient at the bedside.  While patient is awake alert and oriented x3 I question whether the patient has capacity to appropriately make decisions at this point.  He insist that he does not need to be in the hospital yet he also states that he acknowledges he has a potentially limb threatening infection.  I explained to him the need to remain in the hospital for intravenous antibiotics and surgical intervention.  He reluctantly has agreed with my rationale.  He states that he can get a dose of oxycodone for his pain and to help him sleep tonight he would be willing to stay.  This is already ordered and I have asked nursing to go ahead and administer a dose.  We will continue to monitor.  Vernelle Emerald  MD [facility]   ADDENDUM (9/27 4AM)  Notified earlier this morning the patient had a 6 beat run of asymptomatic ventricular tachycardia.  Blood pressure stable.  Chemistry and magnesium have been ordered to be drawn with labs this morning to evaluate for any electrolyte derangement.  Continue to monitor on telemetry.  Sherryll Burger Fayetta Sorenson

## 2022-07-19 NOTE — ED Notes (Signed)
Secretary came to this RN reporting 911 called ED regarding Jacob [name] calling them and stating that he is strapped down to the bed. Pt is not strapped down to the bed. Pt has monitoring equipment on.

## 2022-07-19 NOTE — ED Notes (Signed)
Bed alarm was placed on patient bed.

## 2022-07-19 NOTE — Consult Note (Signed)
PODIATRY CONSULTATION  NAME Jacob [name] MRN [id number] DOB Dec 23, 1950 DOA 07/18/2022   Reason for consult:  Chief Complaint  Patient presents with   Diabetic Ulcer    Attending/Consulting physician: Dr. Doristine Bosworth. Time first contacted regarding this patient: 2:19 PM 07/19/22  History of present illness: 71 y.o. male with history of ESRD on HD, HIV, type 2 diabetes mellitus with right foot diabetic wound, hypertension and several other comorbidities who presented for worsening swelling pain and concern for infection in the right foot. Per chart review appears patient was seeing wound care center for his right foot wound. When asked how long he has had it he says 1 week. Had not been on any antibiotics recently for the foot. He does not offer much history regarding the wound and overall seems not to want discuss his right foot wound but rather complain about his course so far in the hospital.   Past Medical History:  Diagnosis Date   Acute respiratory failure ([place]) 03/01/2018   Anemia    Arthritis    "all over; mostly knees and back" (02/28/2018)   Chronic combined systolic and diastolic CHF (congestive heart failure) (HCC)    Chronic lower back pain    stenosis   Community acquired pneumonia 09/06/2013   COPD (chronic obstructive pulmonary disease) ([place])    Coronary atherosclerosis of native coronary artery    a. 02/2003 s/p CABG x 2 (VG->RI, VG->RPDA; b. 11/2019 PCI: LM nl, LAD 90d, D3 50, RI 100, LCX 100p, OM3 100 - fills via L->L collats from D2/dLAD, RCA 100p, VG->RPDA ok, VG->RI 95 (3.5x48 Synergy XD DES).   Drug abuse ([place])    hx; tested for cocaine as recently as 2/08. says he is not using drugs now - avoided defib. for this reason    ESRD (end stage renal disease) ([place])    Hemo M-W-F- [facility]   Fall at home 10/2020   GERD (gastroesophageal reflux disease)    takes OTC meds as needed   GI bleeding    a. 11/2019 EGD: angiodysplastic lesions w/ bleeding s/p argon  plasma/clipping/epi inj. Multiple admissions for the same.   Glaucoma    uses eye drops daily   Hepatitis B 1968   "tx'd w/isolation; caught it from toilet stools in gym"   History of blood transfusion 03/01/2019   History of colon polyps    benign   History of gout    takes Allopurinol daily as well as Colchicine-if needed (02/28/2018)   History of kidney stones    HTN (hypertension)    takes Coreg,Imdur.and Apresoline daily   Human immunodeficiency virus (HIV) disease ([place]) dx'd 1995   on [place] as of 12/2020.     Hyperlipidemia    Ischemic cardiomyopathy    a. 01/2019 Echo: EF 40-45%, diffuse HK, mild basal septal hypertrophy. Diast dysfxn. Nl RV size/fxn. Sev dil LA. Triv MR/TR/PR.   Muscle spasm    takes Zanaflex as needed   Myocardial infarction ([place]) ~ 2004/2005   Nocturia    Peripheral neuropathy    takes gabapentin daily   Pneumonia    "at least twice" (02/28/2018)   SDH (subdural hematoma) (HCC)    Syphilis, unspecified    Type II diabetes mellitus ([place]) 2004   Lantus daily.Average fasting blood sugar 125-199   Wears glasses    Wears partial dentures        Latest Ref Rng & Units 07/19/2022    3:36 AM 07/18/2022    5:15 PM  07/18/2022   12:40 PM  CBC  WBC 4.0 - 10.5 K/uL 5.9  6.3  7.4   Hemoglobin 13.0 - 17.0 g/dL 9.9  10.3  9.5   Hematocrit 39.0 - 52.0 % 29.9  31.0  29.9   Platelets 150 - 400 K/uL 171  197  195        Latest Ref Rng & Units 07/19/2022    3:36 AM 07/18/2022    5:15 PM 07/18/2022   12:40 PM  BMP  Glucose 70 - 99 mg/dL 142   87   BUN 8 - 23 mg/dL 60   144   Creatinine 0.61 - 1.24 mg/dL 8.25  15.58  15.45   Sodium 135 - 145 mmol/L 134   140   Potassium 3.5 - 5.1 mmol/L 4.7   6.5   Chloride 98 - 111 mmol/L 94   93   CO2 22 - 32 mmol/L 27   26   Calcium 8.9 - 10.3 mg/dL 8.4   8.6       Physical Exam: Lower Extremity Exam Vasc: R - PT weakly palpable, DP 2+ palpable. Cap refill < 3 sec to digits  L - PT palpable, DP palpable. Cap refill <3  sec to digits  Derm: R - Wound: Located plantar aspect 4th metatarsal head, measuring approximately 1 cm diameter circular, probe to bone positive, odor none, drainage moderate serous fluid, no purulence able to be expressed.      L - Normal temp/texture/turgor with no open lesion or clinical signs of infection  MSK:  R - Edema of the right foot, minimally tender to palpation about the wound  L -  No gross deformities. Compartments soft, non-tender, compressible  Neuro: R - Gross sensation diminished. Gross motor function intact   L - Gross sensation diminished. Gross motor function intact  MRI Right foot 9/25 1. Osteomyelitis involving the fourth metatarsal distally as well as the proximal phalanx of the fourth digit.  2. Diffuse subcutaneous edema in the forefoot, cellulitis not excluded. 3. Diffuse regional muscular edema, probably neurogenic. 4. Chronic appearing medial erosion along the first metatarsal head, possibly from gout.    ASSESSMENT/PLAN OF CARE 71 y.o. male with PMHx significant for  ESRD on HD, HIV, type 2 diabetes mellitus with neuropathy, hypertension and several other comorbidities with Right foot infected ulceration sub met head 4 and underlying osteomyelitis of the 4th met head and base of 4th proximal phalanx.   - NPO past MN tonight for OR tomorrow. Plan for 4th metatarsal head resection, likely base of proximal phalanx resection. - Recommend IV abx broad spectrum pending further culture data - Ordered vascular US ABI/TBI bilaterally pre op. If gross abnormality will need vascular consultation - Anticoagulation: Hold pending OR tomorrow  - Wound care: Betadine wet to dry dressing applied  - WB status: WBAT to RLE pre op - Will continue to follow   Thank you for the consult.  Please contact me directly with any questions or concerns.           Everitt Amber, DPM [facility]    [address], [place] [address]  Office [phone]  Fax [phone]

## 2022-07-20 ENCOUNTER — Encounter (HOSPITAL_COMMUNITY): Payer: Self-pay | Admitting: Family Medicine

## 2022-07-20 ENCOUNTER — Encounter (HOSPITAL_COMMUNITY)
Admission: EM | Disposition: A | Discharge status: Home / Self Care | Payer: Self-pay | Source: Home / Self Care | Attending: Family Medicine

## 2022-07-20 ENCOUNTER — Inpatient Hospital Stay (HOSPITAL_COMMUNITY): Payer: Medicare Other | Admitting: Anesthesiology

## 2022-07-20 ENCOUNTER — Inpatient Hospital Stay (HOSPITAL_COMMUNITY): Payer: Medicare Other

## 2022-07-20 ENCOUNTER — Other Ambulatory Visit: Payer: Self-pay

## 2022-07-20 DIAGNOSIS — F17210 Nicotine dependence, cigarettes, uncomplicated: Secondary | ICD-10-CM

## 2022-07-20 DIAGNOSIS — M86071 Acute hematogenous osteomyelitis, right ankle and foot: Secondary | ICD-10-CM | POA: Diagnosis not present

## 2022-07-20 DIAGNOSIS — L97514 Non-pressure chronic ulcer of other part of right foot with necrosis of bone: Secondary | ICD-10-CM

## 2022-07-20 DIAGNOSIS — E1165 Type 2 diabetes mellitus with hyperglycemia: Secondary | ICD-10-CM

## 2022-07-20 DIAGNOSIS — M869 Osteomyelitis, unspecified: Secondary | ICD-10-CM

## 2022-07-20 DIAGNOSIS — E1169 Type 2 diabetes mellitus with other specified complication: Secondary | ICD-10-CM

## 2022-07-20 DIAGNOSIS — J449 Chronic obstructive pulmonary disease, unspecified: Secondary | ICD-10-CM

## 2022-07-20 DIAGNOSIS — M86671 Other chronic osteomyelitis, right ankle and foot: Secondary | ICD-10-CM

## 2022-07-20 DIAGNOSIS — Z794 Long term (current) use of insulin: Secondary | ICD-10-CM

## 2022-07-20 DIAGNOSIS — L97909 Non-pressure chronic ulcer of unspecified part of unspecified lower leg with unspecified severity: Secondary | ICD-10-CM

## 2022-07-20 HISTORY — PX: METATARSAL HEAD EXCISION: SHX5027

## 2022-07-20 LAB — CBC
HCT: 26 % — ABNORMAL LOW (ref 39.0–52.0)
Hemoglobin: 8.6 g/dL — ABNORMAL LOW (ref 13.0–17.0)
MCH: 34.7 pg — ABNORMAL HIGH (ref 26.0–34.0)
MCHC: 33.1 g/dL (ref 30.0–36.0)
MCV: 104.8 fL — ABNORMAL HIGH (ref 80.0–100.0)
Platelets: 149 10*3/uL — ABNORMAL LOW (ref 150–400)
RBC: 2.48 MIL/uL — ABNORMAL LOW (ref 4.22–5.81)
RDW: 13.4 % (ref 11.5–15.5)
WBC: 4.5 10*3/uL (ref 4.0–10.5)
nRBC: 0 % (ref 0.0–0.2)

## 2022-07-20 LAB — RENAL FUNCTION PANEL
Albumin: 2.9 g/dL — ABNORMAL LOW (ref 3.5–5.0)
Anion gap: 20 — ABNORMAL HIGH (ref 5–15)
BUN: 77 mg/dL — ABNORMAL HIGH (ref 8–23)
CO2: 23 mmol/L (ref 22–32)
Calcium: 8.8 mg/dL — ABNORMAL LOW (ref 8.9–10.3)
Chloride: 93 mmol/L — ABNORMAL LOW (ref 98–111)
Creatinine, Ser: 10.73 mg/dL — ABNORMAL HIGH (ref 0.61–1.24)
GFR, Estimated: 5 mL/min — ABNORMAL LOW (ref >60)
Glucose, Bld: 145 mg/dL — ABNORMAL HIGH (ref 70–99)
Phosphorus: 6.9 mg/dL — ABNORMAL HIGH (ref 2.5–4.6)
Potassium: 4.6 mmol/L (ref 3.5–5.1)
Sodium: 136 mmol/L (ref 135–145)

## 2022-07-20 LAB — SURGICAL PCR SCREEN
MRSA, PCR: NEGATIVE
Staphylococcus aureus: POSITIVE — AB

## 2022-07-20 LAB — POCT I-STAT, CHEM 8
BUN: 39 mg/dL — ABNORMAL HIGH (ref 8–23)
Calcium, Ion: 1.01 mmol/L — ABNORMAL LOW (ref 1.15–1.40)
Chloride: 95 mmol/L — ABNORMAL LOW (ref 98–111)
Creatinine, Ser: 7.1 mg/dL — ABNORMAL HIGH (ref 0.61–1.24)
Glucose, Bld: 143 mg/dL — ABNORMAL HIGH (ref 70–99)
HCT: 32 % — ABNORMAL LOW (ref 39.0–52.0)
Hemoglobin: 10.9 g/dL — ABNORMAL LOW (ref 13.0–17.0)
Potassium: 4.4 mmol/L (ref 3.5–5.1)
Sodium: 134 mmol/L — ABNORMAL LOW (ref 135–145)
TCO2: 27 mmol/L (ref 22–32)

## 2022-07-20 LAB — GLUCOSE, CAPILLARY
Glucose-Capillary: 130 mg/dL — ABNORMAL HIGH (ref 70–99)
Glucose-Capillary: 125 mg/dL — ABNORMAL HIGH (ref 70–99)
Glucose-Capillary: 155 mg/dL — ABNORMAL HIGH (ref 70–99)
Glucose-Capillary: 188 mg/dL — ABNORMAL HIGH (ref 70–99)

## 2022-07-20 LAB — HEPATITIS B SURFACE ANTIBODY, QUANTITATIVE: Hep B S AB Quant (Post): 169.9 m[IU]/mL (ref >9.9)

## 2022-07-20 LAB — MAGNESIUM: Magnesium: 2 mg/dL (ref 1.7–2.4)

## 2022-07-20 SURGERY — EXCISION, METATARSAL BONE, HEAD
Anesthesia: General | Site: Toe | Laterality: Right

## 2022-07-20 MED ORDER — MUPIROCIN 2 % EX OINT
1.0000 | TOPICAL_OINTMENT | Freq: Two times a day (BID) | CUTANEOUS | Status: DC
  Administered 2022-07-20 – 2022-07-21 (×4): 1 via NASAL
  Filled 2022-07-20 (×2): qty 22

## 2022-07-20 MED ORDER — PROPOFOL 10 MG/ML IV BOLUS
INTRAVENOUS | Status: DC | PRN
  Administered 2022-07-20: 30 mg via INTRAVENOUS

## 2022-07-20 MED ORDER — CHLORHEXIDINE GLUCONATE 0.12 % MT SOLN: 15.0000 mL | Freq: Once | OROMUCOSAL | Status: AC

## 2022-07-20 MED ORDER — ONDANSETRON HCL 4 MG/2ML IJ SOLN: 4.0000 mg | Freq: Once | INTRAMUSCULAR | Status: DC | PRN

## 2022-07-20 MED ORDER — BUPIVACAINE HCL (PF) 0.5 % IJ SOLN
INTRAMUSCULAR | Status: AC
  Filled 2022-07-20: qty 30

## 2022-07-20 MED ORDER — MIDAZOLAM HCL 2 MG/2ML IJ SOLN
INTRAMUSCULAR | Status: AC
  Filled 2022-07-20: qty 2

## 2022-07-20 MED ORDER — FENTANYL CITRATE (PF) 250 MCG/5ML IJ SOLN
INTRAMUSCULAR | Status: DC | PRN
  Administered 2022-07-20: 50 ug via INTRAVENOUS

## 2022-07-20 MED ORDER — LIDOCAINE 2% (20 MG/ML) 5 ML SYRINGE
INTRAMUSCULAR | Status: AC
  Filled 2022-07-20: qty 5

## 2022-07-20 MED ORDER — CHLORHEXIDINE GLUCONATE 0.12 % MT SOLN
OROMUCOSAL | Status: AC
  Administered 2022-07-20: 15 mL via OROMUCOSAL
  Filled 2022-07-20: qty 15

## 2022-07-20 MED ORDER — INSULIN ASPART 100 UNIT/ML IJ SOLN: 0.0000 [IU] | INTRAMUSCULAR | Status: DC | PRN

## 2022-07-20 MED ORDER — FENTANYL CITRATE (PF) 250 MCG/5ML IJ SOLN
INTRAMUSCULAR | Status: AC
  Filled 2022-07-20: qty 5

## 2022-07-20 MED ORDER — HYDROMORPHONE HCL 1 MG/ML IJ SOLN: 0.2500 mg | INTRAMUSCULAR | Status: DC | PRN

## 2022-07-20 MED ORDER — ORAL CARE MOUTH RINSE: 15.0000 mL | Freq: Once | OROMUCOSAL | Status: AC

## 2022-07-20 MED ORDER — OXYCODONE HCL 5 MG PO TABS: 5.0000 mg | ORAL_TABLET | Freq: Once | ORAL | Status: DC | PRN

## 2022-07-20 MED ORDER — SODIUM CHLORIDE 0.9 % IV SOLN: INTRAVENOUS | Status: DC

## 2022-07-20 MED ORDER — PHENYLEPHRINE 80 MCG/ML (10ML) SYRINGE FOR IV PUSH (FOR BLOOD PRESSURE SUPPORT)
PREFILLED_SYRINGE | INTRAVENOUS | Status: DC | PRN
  Administered 2022-07-20 (×3): 80 ug via INTRAVENOUS

## 2022-07-20 MED ORDER — MIDAZOLAM HCL 2 MG/2ML IJ SOLN
INTRAMUSCULAR | Status: DC | PRN
  Administered 2022-07-20: 1 mg via INTRAVENOUS

## 2022-07-20 MED ORDER — PHENYLEPHRINE 80 MCG/ML (10ML) SYRINGE FOR IV PUSH (FOR BLOOD PRESSURE SUPPORT)
PREFILLED_SYRINGE | INTRAVENOUS | Status: AC
  Filled 2022-07-20: qty 10

## 2022-07-20 MED ORDER — LIDOCAINE 2% (20 MG/ML) 5 ML SYRINGE
INTRAMUSCULAR | Status: DC | PRN
  Administered 2022-07-20: 40 mg via INTRAVENOUS

## 2022-07-20 MED ORDER — SODIUM CHLORIDE 0.9 % IR SOLN
Status: DC | PRN
  Administered 2022-07-20: 3000 mL

## 2022-07-20 MED ORDER — PROPOFOL 500 MG/50ML IV EMUL
INTRAVENOUS | Status: DC | PRN
  Administered 2022-07-20: 40 ug/kg/min via INTRAVENOUS

## 2022-07-20 MED ORDER — 0.9 % SODIUM CHLORIDE (POUR BTL) OPTIME
TOPICAL | Status: DC | PRN
  Administered 2022-07-20: 1000 mL

## 2022-07-20 MED ORDER — ACETAMINOPHEN 500 MG PO TABS: 1000.0000 mg | ORAL_TABLET | Freq: Once | ORAL | Status: DC

## 2022-07-20 MED ORDER — PHENYLEPHRINE HCL-NACL 20-0.9 MG/250ML-% IV SOLN
INTRAVENOUS | Status: DC | PRN
  Administered 2022-07-20: 20 ug/min via INTRAVENOUS

## 2022-07-20 MED ORDER — BUPIVACAINE HCL 0.5 % IJ SOLN
INTRAMUSCULAR | Status: DC | PRN
  Administered 2022-07-20: 20 mL

## 2022-07-20 MED ORDER — OXYCODONE HCL 5 MG/5ML PO SOLN: 5.0000 mg | Freq: Once | ORAL | Status: DC | PRN

## 2022-07-20 SURGICAL SUPPLY — 51 items
BAG COUNTER SPONGE SURGICOUNT (BAG) ×1 IMPLANT
BANDAGE ESMARK 6X9 LF (GAUZE/BANDAGES/DRESSINGS) IMPLANT
BLADE AVERAGE 25X9 (BLADE) ×1 IMPLANT
BLADE SAW RECIP 87.9 MT (BLADE) IMPLANT
BLADE SURG 10 STRL SS (BLADE) ×1 IMPLANT
BNDG COHESIVE 4X5 TAN STRL (GAUZE/BANDAGES/DRESSINGS) ×1 IMPLANT
BNDG ELASTIC 4X5.8 VLCR STR LF (GAUZE/BANDAGES/DRESSINGS) IMPLANT
BNDG ELASTIC 6X5.8 VLCR STR LF (GAUZE/BANDAGES/DRESSINGS) IMPLANT
BNDG ESMARK 6X9 LF (GAUZE/BANDAGES/DRESSINGS)
BNDG GAUZE DERMACEA FLUFF 4 (GAUZE/BANDAGES/DRESSINGS) IMPLANT
CUFF TOURN SGL QUICK 34 (TOURNIQUET CUFF)
CUFF TOURN SGL QUICK 42 (TOURNIQUET CUFF) IMPLANT
CUFF TRNQT CYL 34X4.125X (TOURNIQUET CUFF) IMPLANT
DRAPE C-ARM MINI (DRAPES) ×1 IMPLANT
DRAPE U-SHAPE 47X51 STRL (DRAPES) ×2 IMPLANT
DRSG ADAPTIC 3X8 NADH LF (GAUZE/BANDAGES/DRESSINGS) IMPLANT
DURAPREP 26ML APPLICATOR (WOUND CARE) ×1 IMPLANT
ELECT REM PT RETURN 9FT ADLT (ELECTROSURGICAL) ×1
ELECTRODE REM PT RTRN 9FT ADLT (ELECTROSURGICAL) ×1 IMPLANT
GAUZE SPONGE 4X4 12PLY STRL (GAUZE/BANDAGES/DRESSINGS) ×1 IMPLANT
GAUZE SPONGE 4X4 12PLY STRL LF (GAUZE/BANDAGES/DRESSINGS) IMPLANT
GAUZE XEROFORM 5X9 LF (GAUZE/BANDAGES/DRESSINGS) ×1 IMPLANT
GLOVE BIO SURGEON STRL SZ8 (GLOVE) ×2 IMPLANT
GOWN STRL REUS W/ TWL LRG LVL3 (GOWN DISPOSABLE) ×1 IMPLANT
GOWN STRL REUS W/ TWL XL LVL3 (GOWN DISPOSABLE) ×1 IMPLANT
GOWN STRL REUS W/TWL LRG LVL3 (GOWN DISPOSABLE) ×1
GOWN STRL REUS W/TWL XL LVL3 (GOWN DISPOSABLE) ×1
KIT BASIN OR (CUSTOM PROCEDURE TRAY) ×1 IMPLANT
KIT TURNOVER KIT B (KITS) ×1 IMPLANT
NDL 25GX 5/8IN NON SAFETY (NEEDLE) ×1 IMPLANT
NEEDLE 25GX 5/8IN NON SAFETY (NEEDLE) ×1 IMPLANT
NS IRRIG 1000ML POUR BTL (IV SOLUTION) ×1 IMPLANT
PACK ORTHO EXTREMITY (CUSTOM PROCEDURE TRAY) ×1 IMPLANT
PAD ARMBOARD 7.5X6 YLW CONV (MISCELLANEOUS) ×2 IMPLANT
PAD CAST 4YDX4 CTTN HI CHSV (CAST SUPPLIES) ×1 IMPLANT
PADDING CAST COTTON 4X4 STRL (CAST SUPPLIES) ×1
SPIKE FLUID TRANSFER (MISCELLANEOUS) ×1 IMPLANT
SPONGE T-LAP 18X18 ~~LOC~~+RFID (SPONGE) ×2 IMPLANT
STAPLER VISISTAT 35W (STAPLE) ×1 IMPLANT
STOCKINETTE IMPERVIOUS LG (DRAPES) IMPLANT
SUT ETHILON 2 0 PSLX (SUTURE) IMPLANT
SUT PROLENE 3 0 CT 1 (SUTURE) IMPLANT
SUT PROLENE 3 0 PS 2 (SUTURE) ×2 IMPLANT
SWAB COLLECTION DEVICE MRSA (MISCELLANEOUS) IMPLANT
SWAB CULTURE ESWAB REG 1ML (MISCELLANEOUS) IMPLANT
SYR CONTROL 10ML LL (SYRINGE) ×1 IMPLANT
TOWEL GREEN STERILE (TOWEL DISPOSABLE) ×1 IMPLANT
TOWEL GREEN STERILE FF (TOWEL DISPOSABLE) ×1 IMPLANT
TUBE CONNECTING 12X1/4 (SUCTIONS) ×1 IMPLANT
WATER STERILE IRR 1000ML POUR (IV SOLUTION) ×1 IMPLANT
YANKAUER SUCT BULB TIP NO VENT (SUCTIONS) ×1 IMPLANT

## 2022-07-20 NOTE — Progress Notes (Signed)
PROGRESS NOTE    Jacob [name]  LKG:[id number] DOB: Aug 01, 1951 DOA: 07/18/2022 PCP: [name], MD   Brief Narrative:  HPI: Jacob [name] is a 71 y.o. male with medical history significant of ESRD on HD, diminutive Friday, HIV, type 2 diabetes mellitus with right foot diabetic wound, hypertension and several other comorbidities who presented to ED with a complaint of worsening swelling, pain and infection of the right foot diabetic ulcer.  Per patient, he has been having this diabetic foot ulcer since about 19 weeks and has been getting wound care as outpatient.  He has been told to not put too much weight on the right foot and has diabetic shoes as well but since his scooter has not arrived yet, he has not been able to comply with instructions resulting in worsening pain and wound.  He denies any fever, chills, sweating or any other complaint.  The last time he was seen by wound care was on 07/14/2022.  He is not on any antibiotics.  Apart from that, patient tells [place] that due to inability to walk well, he has not received his dialysis since Wednesday last week.  He denies any shortness of breath.   ED Course: Upon arrival to ED, he was hemodynamically stable.  Right foot x-ray was done which shows suspicion of osteomyelitis of the distal fourth metatarsal and base of the proximal right phalanx of the right foot.  MRI is pending.  Was found to have hyperkalemia.  Nephrology has been consulted by ED.  Per ED provider, podiatry has been paged out but they are waiting for callback.  Patient has not been provided with antibiotics yet since ED provider is waiting for instructions from podiatry.    Assessment & Plan:   Principal Problem:   Osteomyelitis ([place]) Active Problems:   Human immunodeficiency virus (HIV) disease ([place])   Essential hypertension   Coronary artery disease involving native heart without angina pectoris   Hyperkalemia   HTN (hypertension)   Anemia of chronic disease    Hyperlipidemia associated with type 2 diabetes mellitus ([place])   Diabetic foot ulcer ([place])   ESRD on hemodialysis ([place])  Right foot osteomyelitis: Seen by podiatry.  Plan for amputation.  Continue antibiotics in the meantime.  Vascular work-up in progress.  Anemia of chronic disease: Patient's hemoglobin appears to be stable between 9 and 11.  He did mention that he was having black stools.  FOBT is positive as well but however hemoglobin is a stable so I will just start him on IV twice daily Protonix for now.  Monitor his H&H closely.  If continues to drop, will have GI consult.   ESRD on HD, MWF schedule/hyperkalemia: He did have dialysis on the same night.  Potassium normal.  Nephrology on board.   Type 2 diabetes mellitus: Last hemoglobin A1c 3 months ago on 03/22/2022 was 5.9.  Patient does not appear to be on any home medications for diabetes.  Continue SSI.   Anemia of chronic disease: Hemoglobin is stable.   Hyperlipidemia: Resume atorvastatin.   History of CAD with essential hypertension: Asymptomatic.  Continue Imdur, Coreg.   HIV: Continue Biktarvy.  Peripheral neuropathy: Continue Neurontin.  Intermittent delirium: Patient at times is showing signs of delirium, confusion.  However he is easily redirectable.  I will start him on Seroquel tonight.  DVT prophylaxis: heparin injection 5,000 Units Start: 07/18/22 2000   Code Status: Full Code  Family Communication:  None present at bedside.  Plan of  care discussed with patient in length and he/she verbalized understanding and agreed with it.  Status is: Inpatient Remains inpatient appropriate because: Pending surgical intervention by podiatry.   Estimated body mass index is 27.05 kg/m as calculated from the following:   Height as of this encounter: '5\' 9"'$  (1.753 m).   Weight as of this encounter: 83.1 kg.    Nutritional Assessment: Body mass index is 27.05 kg/m.[name] Seen by dietician.  I agree with the assessment and plan as  outlined below: Nutrition Status:        . Skin Assessment: I have examined the patient's skin and I agree with the wound assessment as performed by the wound care RN as outlined below:    Consultants:  Podiatry Nephrology  Procedures:  None  Antimicrobials:  Anti-infectives (From admission, onward)    Start     Dose/Rate Route Frequency Ordered Stop   07/20/22 1200  vancomycin (VANCOCIN) IVPB 1000 mg/200 mL premix        1,000 mg 200 mL/hr over 60 Minutes Intravenous Every M-W-F (Hemodialysis) 07/18/22 2146     07/19/22 1000  bictegravir-emtricitabine-tenofovir AF (BIKTARVY) 50-200-25 MG per tablet 1 tablet        1 tablet Oral Daily 07/18/22 1716     07/18/22 2330  vancomycin (VANCOCIN) IVPB 1000 mg/200 mL premix  Status:  Discontinued        1,000 mg 200 mL/hr over 60 Minutes Intravenous Every M-W-F (Hemodialysis) 07/18/22 2006 07/18/22 2146   07/18/22 2000  ceFEPIme (MAXIPIME) 1 g in sodium chloride 0.9 % 100 mL IVPB        1 g 200 mL/hr over 30 Minutes Intravenous Every 24 hours 07/18/22 1941     07/18/22 2000  vancomycin (VANCOREADY) IVPB 1750 mg/350 mL        1,750 mg 175 mL/hr over 120 Minutes Intravenous  Once 07/18/22 1941 07/19/22 0629   07/18/22 1930  metroNIDAZOLE (FLAGYL) IVPB 500 mg        500 mg 100 mL/hr over 60 Minutes Intravenous Every 12 hours 07/18/22 1917 07/25/22 2159         Subjective:  Patient seen and examined in dialysis unit.  He was fully alert and oriented and he recognized me as well.  He had no complaint.  He did endorse being confused last night.  He blames to the nurses because they do not fully oriented him.  Objective: Vitals:   07/20/22 1100 07/20/22 1130 07/20/22 1200 07/20/22 1230  BP: 130/61 (!) 140/60 139/79 (!) 155/77  Pulse: 71  76 76  Resp: 11 13 (!) 21 15  Temp:      TempSrc:      SpO2: 100% 100% 97% 98%  Weight:      Height:        Intake/Output Summary (Last 24 hours) at 07/20/2022 1250 Last data filed at  07/20/2022 0529 Gross per 24 hour  Intake 340 ml  Output --  Net 340 ml    Filed Weights   07/19/22 1300 07/20/22 0526 07/20/22 0845  Weight: 83.7 kg 83.7 kg 83.1 kg    Examination:  General exam: Appears calm and comfortable  Respiratory system: Clear to auscultation. Respiratory effort normal. Cardiovascular system: S1 & S2 heard, RRR. No JVD, murmurs, rubs, gallops or clicks. No pedal edema. Gastrointestinal system: Abdomen is nondistended, soft and nontender. No organomegaly or masses felt. Normal bowel sounds heard. Central nervous system: Alert and oriented. No focal neurological deficits. Extremities: Dressing in the right  foot.   Data Reviewed: I have personally reviewed following labs and imaging studies  CBC: Recent Labs  Lab 07/18/22 1240 07/18/22 1715 07/19/22 0336 07/20/22 0601  WBC 7.4 6.3 5.9 4.5  NEUTROABS 4.2  --   --   --   HGB 9.5* 10.3* 9.9* 8.6*  HCT 29.9* 31.0* 29.9* 26.0*  MCV 106.8* 104.4* 103.1* 104.8*  PLT 195 197 171 149*    Basic Metabolic Panel: Recent Labs  Lab 07/18/22 1240 07/18/22 1715 07/19/22 0336 07/20/22 0601  NA 140  --  134* 136  K 6.5*  --  4.7 4.6  CL 93*  --  94* 93*  CO2 26  --  27 23  GLUCOSE 87  --  142* 145*  BUN 144*  --  60* 77*  CREATININE 15.45* 15.58* 8.25* 10.73*  CALCIUM 8.6*  --  8.4* 8.8*  MG  --  2.7*  --  2.0  PHOS  --   --   --  6.9*    GFR: Estimated Creatinine Clearance: 6.1 mL/min (A) (by C-G formula based on SCr of 10.73 mg/dL (H)). Liver Function Tests: Recent Labs  Lab 07/18/22 1240 07/19/22 0336 07/20/22 0601  AST 34 33  --   ALT 23 22  --   ALKPHOS 88 100  --   BILITOT 0.7 1.2  --   PROT 7.6 7.7  --   ALBUMIN 3.1* 3.1* 2.9*    Recent Labs  Lab 07/18/22 1240  LIPASE 31    No results for input(s): "AMMONIA" in the last 168 hours. Coagulation Profile: No results for input(s): "INR", "PROTIME" in the last 168 hours. Cardiac Enzymes: No results for input(s): "CKTOTAL",  "CKMB", "CKMBINDEX", "TROPONINI" in the last 168 hours. BNP (last 3 results) No results for input(s): "PROBNP" in the last 8760 hours. HbA1C: No results for input(s): "HGBA1C" in the last 72 hours. CBG: Recent Labs  Lab 07/19/22 0334 07/19/22 0802 07/19/22 2005 07/20/22 0732  GLUCAP 143* 132* 182* 130*    Lipid Profile: No results for input(s): "CHOL", "HDL", "LDLCALC", "TRIG", "CHOLHDL", "LDLDIRECT" in the last 72 hours. Thyroid Function Tests: No results for input(s): "TSH", "T4TOTAL", "FREET4", "T3FREE", "THYROIDAB" in the last 72 hours. Anemia Panel: No results for input(s): "VITAMINB12", "FOLATE", "FERRITIN", "TIBC", "IRON", "RETICCTPCT" in the last 72 hours. Sepsis Labs: Recent Labs  Lab 07/18/22 1240  LATICACIDVEN 1.6     Recent Results (from the past 240 hour(s))  Blood Cultures x 2 sites     Status: None (Preliminary result)   Collection Time: 07/18/22  8:00 PM   Specimen: BLOOD LEFT FOREARM  Result Value Ref Range Status   Specimen Description BLOOD LEFT FOREARM  Final   Special Requests   Final    BOTTLES DRAWN AEROBIC AND ANAEROBIC Blood Culture results may not be optimal due to an inadequate volume of blood received in culture bottles   Culture   Final    NO GROWTH 2 DAYS Performed at [facility], [place] [address], [place], [place] [address]    Report Status PENDING  Incomplete  Blood Cultures x 2 sites     Status: None (Preliminary result)   Collection Time: 07/18/22  8:10 PM   Specimen: BLOOD LEFT HAND  Result Value Ref Range Status   Specimen Description BLOOD LEFT HAND  Final   Special Requests   Final    BOTTLES DRAWN AEROBIC AND ANAEROBIC Blood Culture results may not be optimal due to an excessive volume of  blood received in culture bottles   Culture   Final    NO GROWTH 2 DAYS Performed at [facility], [place] [address] [place], [place] [address]    Report Status PENDING  Incomplete  Surgical pcr screen     Status: Abnormal    Collection Time: 07/20/22 12:43 AM   Specimen: Nasal Mucosa; Nasal Swab  Result Value Ref Range Status   MRSA, PCR NEGATIVE NEGATIVE Final   Staphylococcus aureus POSITIVE (A) NEGATIVE Final    Comment: (NOTE) The Xpert SA Assay (FDA approved for NASAL specimens in patients 47 years of age and older), is one component of a comprehensive surveillance program. It is not intended to diagnose infection nor to guide or monitor treatment. Performed at [facility], [place] [address], [place], [place] [address]      Radiology Studies: DG Foot Complete Right  Result Date: 07/19/2022 Please see detailed radiograph report in office note.  MR FOOT RIGHT WO CONTRAST  Result Date: 07/18/2022 CLINICAL DATA:  Swelling of the foot. End-stage renal disease. Type 2 diabetes. Wound along the foot. EXAM: MRI OF THE RIGHT FOREFOOT WITHOUT CONTRAST TECHNIQUE: Multiplanar, multisequence MR imaging of the right forefoot was performed. No intravenous contrast was administered. COMPARISON:  Radiographs 07/18/2022 FINDINGS: Bones/Joint/Cartilage Abnormal edema signal in bony destructive findings in the distal metaphysis and distal head of the fourth metatarsal and in the proximal phalanx of the fourth digit, most compatible with osteomyelitis. There is some deformity along the distal metaphysis of the fourth metatarsal which could indicate a superimposed fracture, but the erosive appearance and involvement of the proximal phalanx strongly favors infection. Plantar overlying cutaneous wound noted, possibly draining from a small amount of fluid along the volar margin of the distal fourth metatarsal as on images 20 through 21 of series 5 and images 25-26 of series 8. No definite osteomyelitis elsewhere in the forefoot. There are degenerative findings at the first metatarsal head, with suspected medial erosion which can be seen in the setting of gout. Degenerative findings at the articulation with the first digit  sesamoids also noted. Ligaments Lisfranc ligament intact. Muscles and Tendons Diffuse regional muscular edema, probably neurogenic. Soft tissues Diffuse circumferential subcutaneous edema in the foot, cellulitis not excluded. Small plantar wound below the distal fourth metatarsal, cannot exclude drainage site. IMPRESSION: 1. Osteomyelitis involving the fourth metatarsal distally as well as the proximal phalanx of the fourth digit. Possible plantar drainage from the distal fourth metatarsal given the overlying wound. 2. Diffuse subcutaneous edema in the forefoot, cellulitis not excluded. 3. Diffuse regional muscular edema, probably neurogenic. 4. Chronic appearing medial erosion along the first metatarsal head, possibly from gout. Electronically Signed   By: Van Clines M.D.   On: 07/18/2022 19:02   DG Foot Complete Right  Result Date: 07/18/2022 CLINICAL DATA:  Diabetic wound, assess for osteomyelitis EXAM: RIGHT FOOT COMPLETE - 3 VIEW COMPARISON:  Right foot radiograph dated November 16, 2021 FINDINGS: New lucency and irregularity of the distal fourth metatarsal and base of the proximal right phalanx. Moderate degenerative changes of the midfoot and great right toe metatarsal joint. Vascular calcifications. Soft tissues are unremarkable. IMPRESSION: New lucency and irregularity of the distal fourth metatarsal and base of the proximal right phalanx, highly concerning for osteomyelitis. Could be further evaluated with MRI. Electronically Signed   By: Yetta Glassman M.D.   On: 07/18/2022 13:59   DG Chest 2 View  Result Date: 07/18/2022 CLINICAL DATA:  Shortness of breath EXAM:  CHEST - 2 VIEW COMPARISON:  Chest radiograph June 28, 2022 FINDINGS: No pleural effusion. No pneumothorax. Postsurgical changes from median sternotomy and CABG. Aortic atherosclerotic calcifications. Interval removal of the left-sided tunneled central venous catheter. There is pulmonary venous congestion without evidence of  overt pulmonary edema. Cardiomegaly. Chronic healed right-sided rib fractures. Degenerative changes of the right AC joint and glenohumeral joint. IMPRESSION: 1. Pulmonary venous congestion without evidence of overt pulmonary edema. 2.  No focal airspace opacity. Electronically Signed   By: Marin Roberts M.D.   On: 07/18/2022 13:46    Scheduled Meds:  atorvastatin  10 mg Oral Daily   bictegravir-emtricitabine-tenofovir AF  1 tablet Oral Daily   calcitRIOL  1 mcg Oral Q M,W,F   calcium acetate  2,001 mg Oral TID WC   carvedilol  3.125 mg Oral BID WC   Chlorhexidine Gluconate Cloth  6 each Topical Q0600   cinacalcet  30 mg Oral Q M,W,F-HD   folic acid  1 mg Oral Daily   gabapentin  300 mg Oral BID   heparin  5,000 Units Subcutaneous Q8H   insulin aspart  0-15 Units Subcutaneous TID WC   insulin aspart  0-5 Units Subcutaneous QHS   isosorbide mononitrate  30 mg Oral Daily   latanoprost  1 drop Both Eyes QHS   mupirocin ointment  1 Application Nasal BID   pantoprazole (PROTONIX) IV  40 mg Intravenous Q12H   sodium chloride flush  3 mL Intravenous Q12H   timolol  1 drop Both Eyes q AM   Continuous Infusions:  ceFEPime (MAXIPIME) IV Stopped (07/19/22 2200)   metronidazole 500 mg (07/19/22 2219)   vancomycin Stopped (07/20/22 1245)     LOS: 2 days   Darliss Cheney, MD [facility]  07/20/2022, 12:50 PM   *Please note that this is a verbal dictation therefore any spelling or grammatical errors are due to the "[place] [facility]" system interpretation.  Please page via [place] and do not message via secure chat for urgent patient care matters. Secure chat can be used for non urgent patient care matters.  How to contact the [facility] Attending or Consulting provider [place] or covering provider during after hours [place], for this patient?  Check the care team in [facility] and look for a) attending/consulting [facility] provider listed and b) the [facility] team listed. Page or secure chat [phone]. Log into  [email or web address] and use [place]'s universal password to access. If you do not have the password, please contact the hospital operator. Locate the [facility] provider you are looking for under [facility] and page to a number that you can be directly reached. If you still have difficulty reaching the provider, please page the [facility] (Director on Call) for the Hospitalists listed on amion for assistance.

## 2022-07-20 NOTE — Op Note (Signed)
Full Operative Report  Date of Operation: 7:21 PM, 07/20/2022   Patient: Jacob [name] - 71 y.o. male  Surgeon: Yevonne Pax, DPM   Assistant: None  Diagnosis: Osteomyelitis right foot 4th metatarsal head and proximal phalanx.   Procedure:  1. 4th metatarsal head resection, right foot 2. 4th proximal phalanx partial resection, right foot 3. Irrigation and excisional debridement of plantar forefoot ulceration 1.3x 1.5 x 0.5 cm, right foot    Anesthesia: Monitor Anesthesia Care  Stoltzfus, March Rummage, DO  Anesthesiologist: Darral Dash, DO CRNA: Babs Bertin, CRNA; Raj Janus A, CRNA   Estimated Blood Loss: 10 mL  Hemostasis: 1) Anatomical dissection, mechanical compression, electrocautery 2) No tourniquet was used  Implants: * No implants in log *  Materials: Prolene  Injectables: 1) Pre-operatively: 20 cc of 0.5% marcaine plain 2) Post-operatively: None  Specimens: 4th metatarsal head bone for pathology and culture, wound culture 4th MPJ   Antibiotics: IV antibiotics given as scheduled from the floor  Drains: None  Complications: Patient tolerated the procedure well without complication.   Findings: as below  Indications for Procedure: Jacob [name] presents to [place], Nena Kaliope Quinonez, DPM with a chief complaint of draining and infected right plantar forefoot ulceration. Found to have chronic and advanced osteomyelitis of the 4th metatarsal head and the 4th proximal phalanx of the right foot on MRI. The patient has failed conservative treatments of various modalities. At this time the patient has elected to proceed with surgical correction. All alternatives, risks, and complications of the procedures were thoroughly explained to the patient. Patient exhibits appropriate understanding of all discussion points and informed consent was signed and obtained in the chart with no guarantees to surgical outcome given or implied.  Description of  Procedure: Patient was brought to the operating room and remained on his hospital bed in the supine position.  A surgical timeout was performed and all members of the operating room, the procedure, and the surgical site were identified. Local MAC anesthesia occurred. Local anesthetic as previously described was then injected about the operative field in a local infiltrative block.   The right lower extremity was then prepped and draped in the usual sterile manner. The following procedure then began.  Attention was directed to the right lower extremity. A linear type incision was made over the dorsal aspect of the 4th MPJ.  A full thickness incision was made down to bone using a #15 blade. The incision was continued through the soft tissue down to the shaft of the 4th metatarsal. At this time, 1 cc of purulent drainage was noted near the metatarsal head. A deep wound culture was taken at this time and passed off the field. The metatarsal head was noted to be completely soft necrotic and fragmented with pathologic fracture at the neck area. The head and base of the proximal phalanx of the 4th toe were then freed of all soft tissue attachments. Next, a key elevator was then used to free up the periosteum on the metatarsal shaft. Using a sagittal saw, the metatarsal was cut in a dorsal distal to plantar proximal orientation. Next the sagittal saw was used to cut the mid proximal phalanx transversely. A bone culture was taken at this time of the 4th met head bone. The remainder of the 4th met head and base of proximal phalanx bone was passed off the field and sent as a gross specimen.  All remaining non-viable and necrotic tissues were sharply resected and removed. Extensor  and flexors tendons were grasped with a hemostat and cut proximally.   Next attention was directed to the plantar wound present sub 4th met head. The wound was excisionally debrided with resection of the wound margins with 15 blade scalpel. The  wound was further debrided with rongeur. The wound measured 1.5x 1.5 x 0.5 cm post debridement. The surgical site was then flushed with 3041m of saline with pulse lavage.  The skin flap was then approximated and closed dorsally under minimal tension with prolene. The plantar wound was left open to drain and partially packed with betadine soaked guaze.   The surgical site was then dressed with betadine adaptic 4x4 kerlix and ace wrap. The patient tolerated both the procedure and anesthesia well with vital signs stable throughout. The patient was transferred from the OR to recovery under the discretion of anesthesia.  Condition: Patient was taken to PACU in good condition and all vital signs stable and neurovascular status intact to the operative limb.  Discharge: Patient will be readmitted to the floor for ongoing IV abx and wound care  Philomene Haff, ANena Kristalyn Bergstresser DPM will follow the patient throughout the entire post-operative course and the patient is aware of all post-operative protocols in place. The patient will follow the protocol of rest, ice, and elevation. The patient will be weightbearing as tolerated in a post op shoe to the operative limb until further instructed. The dressing is to remain clean, dry, and intact.   AAmes Coupe DPM

## 2022-07-20 NOTE — Procedures (Signed)
Patient seen and examined on Hemodialysis. BP 118/61   Pulse 75   Temp (!) 97.1 F (36.2 C)   Resp 16   Ht '5\' 9"'$  (1.753 m)   Wt 83.1 kg   SpO2 (!) 89%   BMI 27.05 kg/m   QB 400 mL/ min via AVG, UF goal 2L  Tolerating treatment without complaints at this time.   Madelon Lips MD [facility] [phone] 10:16 AM

## 2022-07-20 NOTE — Anesthesia Procedure Notes (Signed)
Procedure Name: MAC Date/Time: 07/20/2022 6:18 PM  Performed by: Lorie Phenix, CRNAPre-anesthesia Checklist: Patient identified, Emergency Drugs available, Suction available and Patient being monitored Patient Re-evaluated:Patient Re-evaluated prior to induction Oxygen Delivery Method: Simple face mask Placement Confirmation: positive ETCO2

## 2022-07-20 NOTE — Progress Notes (Signed)
Received patient in bed to unit.  Alert and oriented.  Informed consent signed and in chart.   Treatment initiated: 0900  Treatment completed: 5110  Patient tolerated well.  Transported back to the room  Alert, without acute distress.  Hand-off given to patient's nurse.   Access used: RAVG Access issues: none  Total UF removed: 2.3L Medication(s) given: Vanc 1G Post HD VS:  Post HD weight:     07/20/22 1245  Vitals  Pulse Rate 81  Resp 12  BP (!) 141/65  SpO2 99 %  Post Treatment  Dialyzer Clearance Clear  Duration of HD Treatment -hour(s) 3.75 hour(s)  Hemodialysis Intake (mL) 0 mL  Liters Processed 83.5  Fluid Removed 2300 mL  Tolerated HD Treatment Yes  AVG/AVF Arterial Site Held (minutes) 10 minutes  AVG/AVF Venous Site Held (minutes) 10 minutes      Na'Shaminy T [facility]

## 2022-07-20 NOTE — Progress Notes (Signed)
[facility] Progress Note   Assessment/ Plan:   Dialysis Orders: Center:[facility] MWF 3 hr 45 min 180NRe 400/Autoflow 1.5 82 kg 2.0 K/2.0 Ca AVG -No heparin -Mircera 75 mcg IV q 2 weeks (last dose 07/06/2022) -Calcitriol 1.0 mcg PO TIW -Sensipar 30 mg PO TIW   Assessment/Plan:  Volume overload in setting of missed HD-Vascular congestion on CXR with LE edema. S/p HD 9/25, next HD 9/27  Hyperkalemia-Given lokelma in ED. Use 2.0 K bath with HD, resolved  Osteomyelitis R foot-W/U per primary. Podiatry consulted. On vanc/ cefepime.  Possible GIB-BUN elevated. Azotemia without signs of uremia. Reports black stools. HGB 11.0 07/13/2022 down to 9.5. FOBT positive.  Per primary  ESRD -  MWF-missed HD 07/15/2022. HD 9/25, next HD 9/27.  NO HEPARIN.   Hypertension/volume  - As noted above. BP well controlled. Low dose carvedilol on OP med list. Has been ordered.   Anemia  - As noted above. ESA due 07/20/2022. Will order. Follow HGB. Transfuse as needed.   Metabolic bone disease -  OP PO4 at goal. Continue binders, VDRA & Sensipar.  Nutrition - Albumin low. Changed to renal/carb mod diet with protein supplements, nepro  DMT2-per primary HIV-meds per primary    Subjective:    Seen on dialysis.  Feeling tired today.  For metatarsal head excision later   Objective:   BP 118/61   Pulse 75   Temp (!) 97.1 F (36.2 C)   Resp 16   Ht '5\' 9"'$  (1.753 m)   Wt 83.1 kg   SpO2 (!) 89%   BMI 27.05 kg/m   Physical Exam: Gen:NAD CVS:RRR Resp: clear Abd: NABS Ext: no LE edema. R sided ulcer on foot ACCESS: R AVG + T/B  Labs: BMET Recent Labs  Lab 07/18/22 1240 07/18/22 1715 07/19/22 0336 07/20/22 0601  NA 140  --  134* 136  K 6.5*  --  4.7 4.6  CL 93*  --  94* 93*  CO2 26  --  27 23  GLUCOSE 87  --  142* 145*  BUN 144*  --  60* 77*  CREATININE 15.45* 15.58* 8.25* 10.73*  CALCIUM 8.6*  --  8.4* 8.8*  PHOS  --   --   --  6.9*   CBC Recent Labs  Lab 07/18/22 1240  07/18/22 1715 07/19/22 0336 07/20/22 0601  WBC 7.4 6.3 5.9 4.5  NEUTROABS 4.2  --   --   --   HGB 9.5* 10.3* 9.9* 8.6*  HCT 29.9* 31.0* 29.9* 26.0*  MCV 106.8* 104.4* 103.1* 104.8*  PLT 195 197 171 149*      Medications:     atorvastatin  10 mg Oral Daily   bictegravir-emtricitabine-tenofovir AF  1 tablet Oral Daily   calcitRIOL  1 mcg Oral Q M,W,F   calcium acetate  2,001 mg Oral TID WC   carvedilol  3.125 mg Oral BID WC   Chlorhexidine Gluconate Cloth  6 each Topical Q0600   cinacalcet  30 mg Oral Q M,W,F-HD   folic acid  1 mg Oral Daily   gabapentin  300 mg Oral BID   heparin  5,000 Units Subcutaneous Q8H   insulin aspart  0-15 Units Subcutaneous TID WC   insulin aspart  0-5 Units Subcutaneous QHS   isosorbide mononitrate  30 mg Oral Daily   latanoprost  1 drop Both Eyes QHS   mupirocin ointment  1 Application Nasal BID   pantoprazole (PROTONIX) IV  40 mg Intravenous Q12H  sodium chloride flush  3 mL Intravenous Q12H   timolol  1 drop Both Eyes q AM     Madelon Lips MD 07/20/2022, 10:15 AM

## 2022-07-20 NOTE — Plan of Care (Signed)
  Problem: Health Behavior/Discharge Planning: Goal: Ability to manage health-related needs will improve Outcome: Not Progressing   Problem: Clinical Measurements: Goal: Will remain free from infection Outcome: Not Progressing   Problem: Clinical Measurements: Goal: Diagnostic test results will improve Outcome: Not Progressing   Problem: Coping: Goal: Level of anxiety will decrease Outcome: Not Progressing   Problem: Pain Managment: Goal: General experience of comfort will improve Outcome: Not Progressing   Problem: Skin Integrity: Goal: Risk for impaired skin integrity will decrease Outcome: Not Progressing   Problem: Safety: Goal: Ability to remain free from injury will improve Outcome: Not Progressing

## 2022-07-20 NOTE — Progress Notes (Signed)
ABI completed. Refer to "CV Proc" under chart review to view preliminary results.  07/20/2022 2:00 PM Kelby Aline., MHA, RVT, RDCS, RDMS

## 2022-07-20 NOTE — Anesthesia Postprocedure Evaluation (Signed)
Anesthesia Post Note  Patient: Jacob [name]  Procedure(s) Performed: Irragation and debridement of right foot wound with extraction of all necrotic soft tissue and bone (Right: Toe)     Patient location during evaluation: PACU Anesthesia Type: MAC Level of consciousness: awake and alert Pain management: pain level controlled Vital Signs Assessment: post-procedure vital signs reviewed and stable Respiratory status: spontaneous breathing, nonlabored ventilation, respiratory function stable and patient connected to nasal cannula oxygen Cardiovascular status: stable and blood pressure returned to baseline Postop Assessment: no apparent nausea or vomiting Anesthetic complications: no   No notable events documented.  Last Vitals:  Vitals:   07/20/22 1945 07/20/22 2009  BP: 112/64 127/67  Pulse: 72 72  Resp: 12 17  Temp: 36.9 C 37 C  SpO2: 96% 100%    Last Pain:  Vitals:   07/20/22 2009  TempSrc: Oral  PainSc:                  March Rummage Lenard Kampf

## 2022-07-20 NOTE — TOC Initial Note (Signed)
Transition of Care [facility]) - Initial/Assessment Note    Patient Details  Name: Jacob [name] MRN: [id number] Date of Birth: 02/06/1951  Transition of [facility] CM/SW Contact:    Tom-Johnson, Renea Ee, RN Phone Number: 07/20/2022, 4:28 PM  Clinical Narrative:                  CM spoke with patient at bedside about needs for post hospital transition. Admitted for Rt foot Osteomyelitis. Scheduled 4th Metatarsal head resection today. On IV abx. From home alone. Has a son. Independent with care prior to admission. Uses [facility] for transportation to and from appointment. Has a cane at home.  PCP is [name], MD and uses [facility] on [address].  Patient notified CM that he ordered a scooter from [employer] and requests it delivered to the hospital. CM called and spoke with [facility] [phone]). Crystal states scooter will be delivered to patient's home tomorrow as they do not deliver equipments to the hospital. Patient aware. Patient states he was told by MD that he will be discharged today after procedure. CM will continue to follow as patient progresses with care towards discharge.    Expected Discharge Plan: Home/Self Care Barriers to Discharge: Continued Medical Work up   Patient Goals and CMS Choice Patient states their goals for this hospitalization and ongoing recovery are:: To return home CMS Medicare.gov Compare Post Acute Care list provided to:: Patient    Expected Discharge Plan and Services Expected Discharge Plan: Home/Self Care   Discharge Planning Services: CM Consult   Living arrangements for the past 2 months: Apartment                 DME Arranged: N/A DME Agency: NA                  Prior Living Arrangements/Services Living arrangements for the past 2 months: Apartment Lives with:: Self Patient language and need for interpreter reviewed:: Yes Do you feel safe going back to the place where you live?: Yes      Need for Family  Participation in Patient Care: Yes (Comment) Care giver support system in place?: Yes (comment) Current home services: DME [name]) Criminal Activity/Legal Involvement Pertinent to Current Situation/Hospitalization: No - Comment as needed  Activities of Daily Living      Permission Sought/Granted Permission sought to share information with : Case Manager, Customer service manager, Family Supports Permission granted to share information with : Yes, Verbal Permission Granted              Emotional Assessment Appearance:: Appears stated age Attitude/Demeanor/Rapport: Engaged, Gracious Affect (typically observed): Accepting, Calm, Appropriate, Hopeful Orientation: : Oriented to Self, Oriented to Place, Oriented to  Time, Oriented to Situation Alcohol / Substance Use: Not Applicable Psych Involvement: No (comment)  Admission diagnosis:  Hyperkalemia [E87.5] Osteomyelitis ([place]) [M86.9] Diabetic osteomyelitis ([place]) [E11.69, M86.9] Patient Active Problem List   Diagnosis Date Noted   Osteomyelitis ([place]) 07/18/2022   Chronic pain syndrome 03/29/2022   Chronic lower back pain    Thrombocytopenia (HCC)    Hypercalcemia 99/24/2683   Helicobacter pylori (H. pylori) infection 04/06/2021   Lumbar foraminal stenosis 03/30/2021   Gastric hemorrhage due to angiodysplasia of stomach    GI bleed 11/24/2020   COVID-19 virus infection 11/24/2020   Other mechanical complication of cranial or spinal infusion catheter, sequela 11/11/2020   Subdural hematoma ([place]) 11/06/2020   Fall at home, initial encounter 11/05/2020   Nicotine dependence, cigarettes, uncomplicated 41/96/2229  Alcohol abuse 11/05/2020   Unspecified trochanteric fracture of left femur, initial encounter for closed fracture ([place]) 11/05/2020   Traumatic subdural hematoma, initial encounter ([place]) 11/05/2020   Fracture of metatarsal of right foot, closed 11/05/2020   Nondisplaced fracture of greater trochanter of left femur,  initial encounter for closed fracture ([place])    Uremia    Angiodysplasia of intestine with hemorrhage    Pruritus, unspecified 10/02/2020   Iron deficiency anemia    Acute blood loss anemia    Acute on chronic anemia    Abnormal nuclear stress test 12/19/2019   Type 2 diabetes mellitus with diabetic neuropathy, unspecified ([place]) 08/07/2019   Allergy, unspecified, initial encounter 02/58/5277   Complication of vascular dialysis catheter 08/02/2019   Drug abuse counseling and surveillance of drug abuser 08/02/2019   Other specified coagulation defects ([place]) 08/02/2019   Secondary hyperparathyroidism of renal origin ([place]) 08/02/2019   Unspecified atrial fibrillation ([place]) 08/02/2019   ESRD on hemodialysis ([place])    Cocaine abuse ([place])    CHF exacerbation ([place]) 07/21/2019   Diabetic foot ulcer ([place]) 02/11/2019   AVM (arteriovenous malformation)    Melena 02/07/2019   Symptomatic anemia 02/06/2019   Hyperlipidemia associated with type 2 diabetes mellitus ([place]) 05/30/2018   Right foot ulcer ([place]) 02/28/2018   Chronic obstructive pulmonary disease ([place]) 02/28/2018   Anemia of chronic disease 11/20/2016   CHF (congestive heart failure) ([place]) 11/10/2016   History of lumbar laminectomy for spinal cord decompression 02/29/2016   Type 2 diabetes mellitus with diabetic polyneuropathy, with long-term current use of insulin ([place]) 06/17/2015   HTN (hypertension) 04/26/2015   DM (diabetes mellitus) ([place]) 04/26/2015   Ischemic cardiomyopathy 05/12/2014   Hyperkalemia 09/06/2013   Bunion of left foot 11/25/2011   Bunion, right foot 11/25/2011   Polysubstance abuse ([place]) 07/28/2011   Hip fracture, left ([place]) 04/04/2011   Closed fracture of neck of femur ([place]) 04/04/2011   Insomnia 02/18/2011   Coronary artery disease involving native heart without angina pectoris 04/20/2009   Chronic combined systolic (congestive) and diastolic (congestive) heart failure ([place]) 04/20/2009   Mixed hyperlipidemia  11/20/2006   Gout 11/20/2006   TOBACCO ABUSE 11/20/2006   Essential hypertension 11/20/2006   Human immunodeficiency virus (HIV) disease ([place]) 09/02/2006   PCP:  [name], MD Pharmacy:   [facility] [place], [place] [address] [address] Phone: [phone] Fax: [phone]     Social Determinants of Health (SDOH) Interventions    Readmission Risk Interventions     No data to display

## 2022-07-20 NOTE — H&P (Signed)
Anesthesia H&P Update: History and Physical Exam reviewed; patient is OK for planned anesthetic and procedure. ? ?

## 2022-07-20 NOTE — Progress Notes (Signed)
PODIATRY PROGRESS NOTE Patient Name: Jacob [name]  DOB [birth year] [place] 07/18/2022  Hospital Day: 3  Assessment:  71 y.o. male with PMHx significant for  ESRD on HD, HIV, type 2 diabetes mellitus with neuropathy, hypertension and several other comorbidities with Right foot infected ulceration sub met head 4 and underlying osteomyelitis of the 4th met head and base of 4th proximal phalanx.  AF, VSS  WBC: 4.5   Wound/Bone Cultures: To be collected in OR  Imaging:  XR Right foot 9/25: New lucency and irregularity of the distal fourth metatarsal and base of the proximal right phalanx, highly concerning for osteomyelitis.   MRI R foot 9/25 WO contrast: 1. Osteomyelitis involving the fourth metatarsal distally as well as the proximal phalanx of the fourth digit. Possible plantar drainage from the distal fourth metatarsal given the overlying wound.  Plan:  - NPO for OR today. Plan for 4th metatarsal head resection, likely base of proximal phalanx resection and debridement of all nonviable soft tissue and bone - Recommend IV abx broad spectrum pending further culture data, rec ID consult - Ordered vascular US ABI/TBI bilaterally pre op, within normal limits RLE - Anticoagulation: OK to resume post op per primary - Wound care: Leave surgical dressing clean dry intact - WB status: WBAT to RLE in post op shoe Will continue to follow        Everitt Amber, [place]    Subjective:  Patient seen in pre op. Had extensive discussion earlier today as well regarding surgical plan. Patient understands need for surgical intervention. At risk for limb loss due to infection. All questions answer consent signed.   Objective:   Vitals:   07/20/22 1623 07/20/22 1647  BP: 101/88 (!) 143/71  Pulse: 78 68  Resp: 18 18  Temp: 98.3 F (36.8 C) 98 F (36.7 C)  SpO2: 100% 100%       Latest Ref Rng & Units 07/20/2022    5:03 PM 07/20/2022    6:01 AM 07/19/2022    3:36 AM   CBC  WBC 4.0 - 10.5 K/uL  4.5  5.9   Hemoglobin 13.0 - 17.0 g/dL 10.9  8.6  9.9   Hematocrit 39.0 - 52.0 % 32.0  26.0  29.9   Platelets 150 - 400 K/uL  149  171        Latest Ref Rng & Units 07/20/2022    5:03 PM 07/20/2022    6:01 AM 07/19/2022    3:36 AM  BMP  Glucose 70 - 99 mg/dL 143  145  142   BUN 8 - 23 mg/dL 39  77  60   Creatinine 0.61 - 1.24 mg/dL 7.10  10.73  8.25   Sodium 135 - 145 mmol/L 134  136  134   Potassium 3.5 - 5.1 mmol/L 4.4  4.6  4.7   Chloride 98 - 111 mmol/L 95  93  94   CO2 22 - 32 mmol/L  23  27   Calcium 8.9 - 10.3 mg/dL  8.8  8.4     General: AAOx3, NAD  Lower Extremity Exam Physical Exam: Lower Extremity Exam Vasc:     R - PT weakly palpable, DP 2+ palpable. Cap refill < 3 sec to digits               L - PT palpable, DP palpable. Cap refill <3 sec to digits   Derm:    R - Wound: Located  plantar aspect 4th metatarsal head, measuring approximately 1 cm diameter circular, probe to bone positive, odor none, drainage moderate serous fluid, no purulence able to be expressed.                      L - Normal temp/texture/turgor with no open lesion or clinical signs of infection   MSK:     R - Edema of the right foot, minimally tender to palpation about the wound               L -  No gross deformities. Compartments soft, non-tender, compressible   Neuro:   R - Gross sensation diminished. Gross motor function intact                 L - Gross sensation diminished. Gross motor function intact     Radiology:  Results reviewed. See assessment for pertinent imaging results  

## 2022-07-20 NOTE — Progress Notes (Signed)
Mobility Specialist Progress Note:   07/20/22 1600  Mobility  Activity Transferred to/from [facility]  Level of Assistance Minimal assist, patient does 75% or more  Assistive Device None  Distance Ambulated (ft) 5 ft  Activity Response Tolerated well  $Mobility charge 1 Mobility   Pt is in poor mood upon arrival d/t scooter he ordered not being here. Pt agreed to transfer to [facility]. Unsteady upon standing, requiring minA to recover. Pt back sitting EOB with all needs met.   CM involved with equipment.   Nelta Numbers Acute Rehab Secure Chat or Office Phone: [phone]

## 2022-07-20 NOTE — Progress Notes (Signed)
Pt stable for HD no c/os RAVG +/+ ufg 3L Received patient in bed to unit.  Alert and oriented.  Informed consent signed and in chart.   T  Na'Shaminy T [facility]

## 2022-07-20 NOTE — Consult Note (Addendum)
[facility] Nurse Consult Note: WOC consult for foot wound was requested prior to podiatry team involvement.  They are now following ; according to progress notes, " NPO past MN tonight for OR tomorrow. Plan for 4th metatarsal head resection, likely base of proximal phalanx resection." Please refer to their team for further questions regarding plan of care.  Please re-consult if further assistance is needed.  Thank-you,  Julien Girt MSN, [place], [place], [place], [place]

## 2022-07-20 NOTE — Transfer of Care (Signed)
Immediate Anesthesia Transfer of Care Note  Patient: Jacob [name]  Procedure(s) Performed: Irragation and debridement of right foot wound with extraction of all necrotic soft tissue and bone (Right: Toe)  Patient Location: PACU  Anesthesia Type:MAC  Level of Consciousness: responds to stimulation  Airway & Oxygen Therapy: Patient Spontanous Breathing  Post-op Assessment: Report given to RN and Post -op Vital signs reviewed and stable  Post vital signs: Reviewed and stable  Last Vitals:  Vitals Value Taken Time  BP    Temp    Pulse    Resp    SpO2      Last Pain:  Vitals:   07/20/22 1647  TempSrc: Oral  PainSc: 4       Patients Stated Pain Goal: 2 (81/82/99 3716)  Complications: No notable events documented.

## 2022-07-20 NOTE — Progress Notes (Signed)
Orthopedic Tech Progress Note Patient Details:  Jacob [name] January 04, 1951 [id number]  Ortho Devices Type of Ortho Device: Postop shoe/boot Ortho Device/Splint Location: Right foot Ortho Device/Splint Interventions: Ordered      Linus Salmons Tremeka Helbling 07/20/2022, 7:49 PM

## 2022-07-20 NOTE — Anesthesia Preprocedure Evaluation (Addendum)
Anesthesia Evaluation  Patient identified by MRN, date of birth, ID band Patient awake    Reviewed: Allergy & Precautions, NPO status , Patient's Chart, lab work & pertinent test results  History of Anesthesia Complications Negative for: history of anesthetic complications  Airway Mallampati: II  TM Distance: >3 FB Neck ROM: Full    Dental  (+) Poor Dentition, Missing, Chipped, Dental Advisory Given   Pulmonary pneumonia, resolved, COPD,  COPD inhaler, Current Smoker and Patient abstained from smoking.,    Pulmonary exam normal breath sounds clear to auscultation       Cardiovascular hypertension, Pt. on home beta blockers and Pt. on medications + CAD, + Past MI, + Cardiac Stents, + CABG and +CHF  Normal cardiovascular exam Rhythm:Regular Rate:Normal  Hx/o ischemic CM '21 TTE - EF 40 to 45%. The left ventricular internal cavity size was moderately dilated. There is moderate concentric left ventricular hypertrophy. Grade II diastolic dysfunction (pseudonormalization). Left atrial size was moderately dilated. Mild MR    Neuro/Psych Peripheral neuropathy Hx SDH  Neuromuscular disease negative psych ROS   GI/Hepatic GERD  Medicated,(+)     substance abuse  cocaine use, Hepatitis -, B  Endo/Other  diabetes, Poorly Controlled, Type 2, Insulin Dependent  Renal/GU ESRF and DialysisRenal diseaseLast dialysis today  negative genitourinary   Musculoskeletal  (+) Arthritis , Osteoarthritis,  Osteomyelitis right foot   Abdominal   Peds  Hematology  (+) Blood dyscrasia, anemia , HIV,  Plt 149k    Anesthesia Other Findings Syphilis   Reproductive/Obstetrics                            Anesthesia Physical Anesthesia Plan  ASA: 4  Anesthesia Plan: MAC   Post-op Pain Management: Tylenol PO (pre-op)*   Induction: Intravenous  PONV Risk Score and Plan: 1 and Treatment may vary due to age or  medical condition, Ondansetron, Dexamethasone and Propofol infusion  Airway Management Planned: Simple Face Mask, Natural Airway and Nasal Cannula  Additional Equipment: None  Intra-op Plan:   Post-operative Plan:   Informed Consent: I have reviewed the patients History and Physical, chart, labs and discussed the procedure including the risks, benefits and alternatives for the proposed anesthesia with the patient or authorized representative who has indicated his/her understanding and acceptance.     Dental advisory given  Plan Discussed with: CRNA and Anesthesiologist  Anesthesia Plan Comments:       Anesthesia Quick Evaluation

## 2022-07-21 ENCOUNTER — Inpatient Hospital Stay (HOSPITAL_COMMUNITY)
Admission: RE | Admit: 2022-07-21 | Discharge: 2022-07-21 | Disposition: A | Discharge status: Home / Self Care | Payer: Medicare Other | Source: Ambulatory Visit | Attending: Gastroenterology | Admitting: Gastroenterology

## 2022-07-21 ENCOUNTER — Encounter (HOSPITAL_COMMUNITY): Payer: Self-pay | Admitting: Podiatry

## 2022-07-21 DIAGNOSIS — B20 Human immunodeficiency virus [HIV] disease: Secondary | ICD-10-CM

## 2022-07-21 DIAGNOSIS — M86071 Acute hematogenous osteomyelitis, right ankle and foot: Secondary | ICD-10-CM | POA: Diagnosis not present

## 2022-07-21 LAB — CBC
HCT: 24.5 % — ABNORMAL LOW (ref 39.0–52.0)
Hemoglobin: 8.3 g/dL — ABNORMAL LOW (ref 13.0–17.0)
MCH: 34.9 pg — ABNORMAL HIGH (ref 26.0–34.0)
MCHC: 33.9 g/dL (ref 30.0–36.0)
MCV: 102.9 fL — ABNORMAL HIGH (ref 80.0–100.0)
Platelets: 135 10*3/uL — ABNORMAL LOW (ref 150–400)
RBC: 2.38 MIL/uL — ABNORMAL LOW (ref 4.22–5.81)
RDW: 13.2 % (ref 11.5–15.5)
WBC: 4.2 10*3/uL (ref 4.0–10.5)
nRBC: 0 % (ref 0.0–0.2)

## 2022-07-21 LAB — GLUCOSE, CAPILLARY
Glucose-Capillary: 138 mg/dL — ABNORMAL HIGH (ref 70–99)
Glucose-Capillary: 149 mg/dL — ABNORMAL HIGH (ref 70–99)
Glucose-Capillary: 151 mg/dL — ABNORMAL HIGH (ref 70–99)
Glucose-Capillary: 109 mg/dL — ABNORMAL HIGH (ref 70–99)

## 2022-07-21 MED ORDER — METRONIDAZOLE 500 MG PO TABS
500.0000 mg | ORAL_TABLET | Freq: Two times a day (BID) | ORAL | Status: DC
  Administered 2022-07-21: 500 mg via ORAL
  Filled 2022-07-21: qty 1

## 2022-07-21 MED ORDER — OXYCODONE-ACETAMINOPHEN 5-325 MG PO TABS: 1.0000 | ORAL_TABLET | ORAL | Status: DC | PRN

## 2022-07-21 MED ORDER — OCTREOTIDE ACETATE 10 MG IM KIT
10.0000 mg | PACK | Freq: Once | INTRAMUSCULAR | Status: AC
  Administered 2022-07-21: 10 mg via INTRAMUSCULAR
  Filled 2022-07-21: qty 1

## 2022-07-21 MED ORDER — OXYCODONE-ACETAMINOPHEN 5-325 MG PO TABS
1.0000 | ORAL_TABLET | ORAL | Status: DC | PRN
  Administered 2022-07-21 (×3): 1 via ORAL
  Filled 2022-07-21 (×3): qty 1

## 2022-07-21 MED ORDER — DARBEPOETIN ALFA 200 MCG/0.4ML IJ SOSY
200.0000 ug | PREFILLED_SYRINGE | INTRAMUSCULAR | Status: DC
  Administered 2022-07-22: 200 ug via INTRAVENOUS
  Filled 2022-07-21 (×2): qty 0.4

## 2022-07-21 NOTE — Progress Notes (Signed)
Mobility Specialist Criteria Algorithm Info.   07/21/22 0955  Therapy Vitals  Temp 97.6 F (36.4 C)  Temp Source Oral  Pulse Rate 65  Resp 18  BP 109/67  Patient Position (if appropriate) Sitting  Oxygen Therapy  O2 Device Room Air  Mobility  Activity Dangled on edge of bed;Transferred to/from [facility]  Range of Motion/Exercises Active;All extremities  Level of Assistance Minimal assist, patient does 75% or more  Assistive Device Front wheel walker  RLE Weight Bearing WBAT  Activity Response Tolerated well   Patient received dangling EOB requesting assistance to [facility]. Transferred to and from [facility] with min A + cues for hand placement. Returned to EOB complaint or incident. Was left dangling with all needs met, call bell in reach.   [place] Baljit Liebert, [place], [place]  VGVSY:[phone] Office: [phone]

## 2022-07-21 NOTE — Evaluation (Signed)
Physical Therapy Evaluation Patient Details Name: Jacob [name] MRN: [id number] DOB: 07-Jul-1951 Today's Date: 07/21/2022  History of Present Illness  Pt is a 71 y/o male who admitted for osteomyelitis of R foot. Pt underwent 4th toe resection and I&D of plantar ulceration of R foot on 9/27. PMH: ESRD on HD, HIV, DM2 with neuropathy, HTN, and chronic diabetic ulcer of R foot.  Clinical Impression  PTA pt living alone in handicapped equipped apartment. Pt reports independence with mobility, ADLs/iADLs, take SCAT to HD. Pt is currently limited in safe mobility by decreased safety awareness, and DME and post-op boot management, in presence of decreased strength and endurance. Pt adamant about using knee scooter at discharge, had one ordered from [place]. Pt does not like the visual impact of RW usage and feels knee scooter looks better. PT brought knee scooter to practice, discussed difficulties in knee scooter balance. Pt requiring modA for steadying as pt placing R knee on scooter. Pt requiring maxA for steadying at hips to keep from falling. Pt able to propel short distance with increasing R quadriceps fatigue and decreased core strength pt forward flexed over scooter, finally admits that he is not able to safely use. With increased encouragement pt finally tries RW and agrees safer option. PT recommending RW. PT will continue to follow acutely however Pt will have no further PT needs at discharge.       Recommendations for follow up therapy are one component of a multi-disciplinary discharge planning process, led by the attending physician.  Recommendations may be updated based on patient status, additional functional criteria and insurance authorization.  Follow Up Recommendations No PT follow up      Assistance Recommended at Discharge PRN  Patient can return home with the following  Assistance with cooking/housework;Assist for transportation    Equipment Recommendations Rolling walker (2  wheels)     Functional Status Assessment Patient has had a recent decline in their functional status and demonstrates the ability to make significant improvements in function in a reasonable and predictable amount of time.     Precautions / Restrictions Precautions Precautions: Fall Required Braces or Orthoses: Other Brace Other Brace: post op shoe R foot Restrictions Weight Bearing Restrictions: Yes RLE Weight Bearing: Weight bearing as tolerated Other Position/Activity Restrictions: R post op shoe      Mobility  Bed Mobility Overal bed mobility: Modified Independent                  Transfers Overall transfer level: Needs assistance Equipment used: Rolling walker (2 wheels) (knee scooter) Transfers: Sit to/from Stand Sit to Stand: Mod assist, Min guard           General transfer comment: modA for stabilizing at hips for bringing R knee up to knee scooter and steady, with brakes on, min guard for power up to RW, vc for hand placment.    Ambulation/Gait Ambulation/Gait assistance: Max assist, Min guard Gait Distance (Feet): 20 Feet (+40) Assistive device: Rolling walker (2 wheels) (knee scooter) Gait Pattern/deviations: Trunk flexed, Decreased dorsiflexion - right, Knees buckling, Step-through pattern Gait velocity: too fast for abilities with knee scooter and slowed with RW Gait velocity interpretation: <1.31 ft/sec, indicative of household ambulator   General Gait Details: attempted to mobilize with knee scooter, pt requirimg maxA at hips to maintain balance on knees scooter, with distance fatigue in R quads, agreeable to return to room, with RW pt able to ambulate with min guard and constant cuing for proximity to RW  Balance Overall balance assessment: Needs assistance Sitting-balance support: No upper extremity supported, Feet supported Sitting balance-Leahy Scale: Good     Standing balance support: Single extremity supported, During functional  activity Standing balance-Leahy Scale: Fair Standing balance comment: can briefly stand statically without support but loses balance with dynamic tasks                             Pertinent Vitals/Pain Pain Assessment Pain Assessment: No/denies pain    Home Living Family/patient expects to be discharged to:: Private residence Living Arrangements: Alone Available Help at Discharge: Family;Friend(s) Type of Home: Apartment Home Access: Level entry       Home Layout: One level Home Equipment: Cane - single point;Shower seat;Hand held shower head;Grab bars - tub/shower;Grab bars - toilet Additional Comments: lives in handicapped accessible senior apartments with pull lights in bathroom, reports getting a knee scooter delivered to his room    Prior Function Prior Level of Function : Needs assist             Mobility Comments: reports use of cane for mobility ADLs Comments: Mod I for ADLs, basic household IADLs. does not drive. uses SCAT to get to HD and family/friends to take him to grocery store, etc     Hand Dominance   Dominant Hand: Right    Extremity/Trunk Assessment   Upper Extremity Assessment Upper Extremity Assessment: Defer to OT evaluation    Lower Extremity Assessment Lower Extremity Assessment: RLE deficits/detail RLE Deficits / Details: R 4th toe resection, hip, knee, ankle ROM WFL, strength grossly 3+/5 RLE Sensation: decreased light touch    Cervical / Trunk Assessment Cervical / Trunk Assessment: Normal  Communication   Communication: No difficulties  Cognition Arousal/Alertness: Awake/alert Behavior During Therapy: WFL for tasks assessed/performed, Impulsive, Restless Overall Cognitive Status: No family/caregiver present to determine baseline cognitive functioning                                 General Comments: initially frustrated by lack of info from doctors, DC plan, etc. After time spent educated on MD names, who  will see him today and goal of DC home - pt amenable to participate. follows directions though a bit impulsive at times. declines need for RW use though unsteady with cane        General Comments General comments (skin integrity, edema, etc.): Maximal education on safety with DME, expressed understanding about visual impact of being seen ambulating with RW, however a fall with hip fx would be very difficult to overcome, pt reluctantly in agreement with recommendation for RW        Assessment/Plan    PT Assessment Patient needs continued PT services  PT Problem List Decreased strength;Decreased activity tolerance;Decreased balance;Decreased mobility;Decreased coordination;Decreased cognition;Decreased knowledge of use of DME;Decreased safety awareness;Decreased knowledge of precautions;Decreased skin integrity       PT Treatment Interventions DME instruction;Gait training;Functional mobility training;Therapeutic activities;Therapeutic exercise;Balance training;Cognitive remediation;Patient/family education    PT Goals (Current goals can be found in the Care Plan section)  Acute Rehab PT Goals Patient Stated Goal: not be seen as "crippled" PT Goal Formulation: With patient Time For Goal Achievement: 08/03/22 Potential to Achieve Goals: Good    Frequency Min 3X/week        AM-PAC PT "6 Clicks" Mobility  Outcome Measure Help needed turning from your back to your side while in a  flat bed without using bedrails?: None Help needed moving from lying on your back to sitting on the side of a flat bed without using bedrails?: None Help needed moving to and from a bed to a chair (including a wheelchair)?: None Help needed standing up from a chair using your arms (e.g., wheelchair or bedside chair)?: None Help needed to walk in hospital room?: A Little Help needed climbing 3-5 steps with a railing? : A Lot 6 Click Score: 21    End of Session Equipment Utilized During Treatment: Gait  belt Activity Tolerance: Other (comment) (limited by decreased understanding of safe DME) Patient left: in bed;with call bell/phone within reach Nurse Communication: Mobility status PT Visit Diagnosis: Muscle weakness (generalized) (M62.81);Other abnormalities of gait and mobility (R26.89);Unsteadiness on feet (R26.81);Difficulty in walking, not elsewhere classified (R26.2)    Time: 1010-1056 PT Time Calculation (min) (ACUTE ONLY): 46 min   Charges:   PT Evaluation $PT Eval Moderate Complexity: 1 Mod PT Treatments $Gait Training: 23-37 mins        Anakaren Campion B. [name] PT, DPT Acute Rehabilitation Services Please use secure chat or  Call Office [phone]   [place] 07/21/2022, 2:55 PM

## 2022-07-21 NOTE — Progress Notes (Signed)
PODIATRY PROGRESS NOTE Patient Name: Jacob [name]  DOB 06-Mar-1951 [place] 07/18/2022  Hospital Day: 4  Assessment:  71 y.o. male with PMHx significant for  ESRD on HD, HIV, type 2 diabetes mellitus with neuropathy, hypertension and several other comorbidities with Right foot infected ulceration sub met head 4 and underlying osteomyelitis of the 4th met head and base of 4th proximal phalanx.  POD 1 s/p R 4th met head resection, 4th proximal phalanx base resection, I&D of plantar foot wound. Found to have advanced necrosis of the 4th met head with pathologic fracture present. Proximal phalanx also soft and necrotic consistent with chronic OM  AF, VSS  WBC: 4.2  Wound/Bone Cultures: pending  Imaging:  XR Right foot 9/25: New lucency and irregularity of the distal fourth metatarsal and base of the proximal right phalanx, highly concerning for osteomyelitis.   MRI R foot 9/25 WO contrast: 1. Osteomyelitis involving the fourth metatarsal distally as well as the proximal phalanx of the fourth digit. Possible plantar drainage from the distal fourth metatarsal given the overlying wound.  Plan:  - POD 1 s/p R  4th met head resection, 4th proximal phalanx base resection, I&D of plantar foot wound. Progressing as expected postop - abx per ID, appreciate recs - Anticoagulation: per primary - Wound care: Leave surgical dressing clean dry intact until first change tomorrow. Recommend MWF betadine wet to dry dressing over the dorsal incision and plantar foot wound.  - WB status: WBAT to RLE in post op shoe - Stable for discharge per podiatry when abx for home and home wound care set up - Follow up in office next thursday Will continue to follow        Everitt Amber, [place]    Subjective:  Patient seen earlier this AM. He was upset he was not going home right away. Discussed importance of making sure safe discharge in place before he goes.   Objective:   Vitals:    07/21/22 1635 07/21/22 2110  BP: 116/63 (!) 121/107  Pulse: 78 73  Resp: 18 18  Temp: (!) 97.3 F (36.3 C) 98.1 F (36.7 C)  SpO2: 96% 100%       Latest Ref Rng & Units 07/21/2022    4:28 AM 07/20/2022    5:03 PM 07/20/2022    6:01 AM  CBC  WBC 4.0 - 10.5 K/uL 4.2   4.5   Hemoglobin 13.0 - 17.0 g/dL 8.3  10.9  8.6   Hematocrit 39.0 - 52.0 % 24.5  32.0  26.0   Platelets 150 - 400 K/uL 135   149        Latest Ref Rng & Units 07/20/2022    5:03 PM 07/20/2022    6:01 AM 07/19/2022    3:36 AM  BMP  Glucose 70 - 99 mg/dL 143  145  142   BUN 8 - 23 mg/dL 39  77  60   Creatinine 0.61 - 1.24 mg/dL 7.10  10.73  8.25   Sodium 135 - 145 mmol/L 134  136  134   Potassium 3.5 - 5.1 mmol/L 4.4  4.6  4.7   Chloride 98 - 111 mmol/L 95  93  94   CO2 22 - 32 mmol/L  23  27   Calcium 8.9 - 10.3 mg/dL  8.8  8.4     General: AAOx3, NAD  Lower Extremity Exam Physical Exam: Lower Extremity Exam Vasc:     R -  PT weakly palpable, DP 2+ palpable. Cap refill < 3 sec to digits               L - PT palpable, DP palpable. Cap refill <3 sec to digits   Derm:    R - Dressing clean dry and intact                 L - Normal temp/texture/turgor with no open lesion or clinical signs of infection   MSK:     R - Edema of the right foot, minimally tender to palpation about the wound               L -  No gross deformities. Compartments soft, non-tender, compressible   Neuro:   R - Gross sensation diminished. Gross motor function intact                 L - Gross sensation diminished. Gross motor function intact     Radiology:  Results reviewed. See assessment for pertinent imaging results

## 2022-07-21 NOTE — Care Management Important Message (Signed)
Important Message  Patient Details  Name: Jacob [name] MRN: [id number] Date of Birth: 24-Dec-1950   Medicare Important Message Given:  Yes     Orbie Pyo 07/21/2022, 3:12 PM

## 2022-07-21 NOTE — Consult Note (Signed)
[facility]    Date of Admission:  07/18/2022     Reason for Consult: diabetic foot om    Referring Provider: Doristine Bosworth      Abx: 9/25-c vanc 9/25-c metronidazole 9/25-c cefepime         Assessment: 71 yo male with hiv controlled on biktarvy, esrd, dm2, admitted for diabetic foot om  #4th toe right side om Mri showed right foot 4th mt/phalanx om, s/p I&D 9/27 without partial ray amputation. Cx in progress  #hiv Lab Results  Component Value Date   HIV1RNAQUANT NOT DETECTED 02/24/2022   Lab Results  Component Value Date   CD4TCELL 30 (L) 02/24/2022   CD4TABS 315 (L) 02/24/2022       Plan: Continue current abx Metronidazole can be transitioned to oral  Will f/u culture and adjust abx as needed Continue biktarvy for hiv Will arrange f/u with dr [name] when he is discharged Discussed with primary team   I spent 75 minute reviewing data/chart, and coordinating care and >50% direct face to face time providing counseling/discussing diagnostics/treatment plan with patient     ------------------------------------------------ Principal Problem:   Osteomyelitis ([place]) Active Problems:   Human immunodeficiency virus (HIV) disease ([place])   Essential hypertension   Coronary artery disease involving native heart without angina pectoris   Hyperkalemia   HTN (hypertension)   Anemia of chronic disease   Hyperlipidemia associated with type 2 diabetes mellitus ([place])   Diabetic foot ulcer ([place])   ESRD on hemodialysis ([place])    HPI: Jacob [name] is a 71 y.o. male with hiv controlled on biktarvy, esrd, dm2, admitted for diabetic foot om  He is well controlled on biktarvy for his hiv. [facility] dr [name] [facility]  He has had several months of the right foot ulcer cared by podiatry/wound care. This gotten worse with cellulitic change (pain/purulence). He denies f/c however  On arrival Afebrile; hds Imaging showed 4th mt  and phalanx om Podiatry took to or 9/27 and did mt head resection and partial phalanx resection; cx in progress Admission bcx ngtd  He has no complaint at this time    Family History  Problem Relation Age of Onset   Heart failure Father    Hypertension Father    Diabetes Brother    Heart attack Brother    Alzheimer's disease Mother    Stroke Sister    Diabetes Sister    Alzheimer's disease Sister    Hypertension Brother    Diabetes Brother    Drug abuse Brother    Colon cancer Neg Hx     Social History   Tobacco Use   Smoking status: Every Day    Packs/day: 0.50    Years: 43.00    Total pack years: 21.50    Types: Cigarettes    Passive exposure: Never   Smokeless tobacco: Never  Vaping Use   Vaping Use: Never used  Substance Use Topics   Alcohol use: Yes    Alcohol/week: 12.0 standard drinks of alcohol    Types: 12 Standard drinks or equivalent per week    Comment: occassional   Drug use: Not Currently    Types: Cocaine    Comment: hx of crack/cocaine 40yr ago 10/01/2019- none    Allergies  Allergen Reactions   Augmentin [Amoxicillin-Pot Clavulanate] Diarrhea and Other (See Comments)    Severe diarrhea   Morphine Sulfate Anaphylaxis    Can't mix with oxycodone  Mucinex Fast-Max Other (See Comments)    Intense sweating    Amphetamines Other (See Comments)    Unknown reaction    Review of Systems: ROS All Other ROS was negative, except mentioned above   Past Medical History:  Diagnosis Date   Acute respiratory failure ([place]) 03/01/2018   Anemia    Arthritis    "all over; mostly knees and back" (02/28/2018)   Chronic combined systolic and diastolic CHF (congestive heart failure) (H[facility])    Chronic lower back pain    stenosis   Community acquired pneumonia 09/06/2013   COPD (chronic obstructive pulmonary disease) ([place])    Coronary atherosclerosis of native coronary artery    a. 02/2003 s/p CABG x 2 (VG->RI, VG->RPDA; b. 11/2019 PCI: LM nl, LAD 90d, D3  50, RI 100, LCX 100p, OM3 100 - fills via L->L collats from D2/dLAD, RCA 100p, VG->RPDA ok, VG->RI 95 (3.5x48 Synergy XD DES).   Drug abuse ([place])    hx; tested for cocaine as recently as 2/08. says he is not using drugs now - avoided defib. for this reason    ESRD (end stage renal disease) ([place])    Hemo M-W-F- [facility]   Fall at home 10/2020   GERD (gastroesophageal reflux disease)    takes OTC meds as needed   GI bleeding    a. 11/2019 EGD: angiodysplastic lesions w/ bleeding s/p argon plasma/clipping/epi inj. Multiple admissions for the same.   Glaucoma    uses eye drops daily   Hepatitis B 1968   "tx'd w/isolation; caught it from toilet stools in gym"   History of blood transfusion 03/01/2019   History of colon polyps    benign   History of gout    takes Allopurinol daily as well as Colchicine-if needed (02/28/2018)   History of kidney stones    HTN (hypertension)    takes Coreg,Imdur.and Apresoline daily   Human immunodeficiency virus (HIV) disease ([place]) dx'd 1995   on [place] as of 12/2020.     Hyperlipidemia    Ischemic cardiomyopathy    a. 01/2019 Echo: EF 40-45%, diffuse HK, mild basal septal hypertrophy. Diast dysfxn. Nl RV size/fxn. Sev dil LA. Triv MR/TR/PR.   Muscle spasm    takes Zanaflex as needed   Myocardial infarction ([place]) ~ 2004/2005   Nocturia    Peripheral neuropathy    takes gabapentin daily   Pneumonia    "at least twice" (02/28/2018)   SDH (subdural hematoma) ([facility])    Syphilis, unspecified    Type II diabetes mellitus ([place]) 2004   Lantus daily.Average fasting blood sugar 125-199   Wears glasses    Wears partial dentures        Scheduled Meds:  atorvastatin  10 mg Oral Daily   bictegravir-emtricitabine-tenofovir AF  1 tablet Oral Daily   calcitRIOL  1 mcg Oral Q M,W,F   calcium acetate  2,001 mg Oral TID WC   carvedilol  3.125 mg Oral BID WC   Chlorhexidine Gluconate Cloth  6 each Topical Q0600   cinacalcet  30 mg Oral Q M,W,F-HD   [START ON  07/22/2022] darbepoetin (ARANESP) injection - DIALYSIS  200 mcg Intravenous Q Fri-HD   folic acid  1 mg Oral Daily   gabapentin  300 mg Oral BID   insulin aspart  0-15 Units Subcutaneous TID WC   insulin aspart  0-5 Units Subcutaneous QHS   isosorbide mononitrate  30 mg Oral Daily   latanoprost  1 drop Both Eyes QHS  mupirocin ointment  1 Application Nasal BID   pantoprazole (PROTONIX) IV  40 mg Intravenous Q12H   sodium chloride flush  3 mL Intravenous Q12H   timolol  1 drop Both Eyes q AM   Continuous Infusions:  ceFEPime (MAXIPIME) IV 1 g (07/20/22 2258)   metronidazole 500 mg (07/21/22 0908)   vancomycin Stopped (07/20/22 1245)   PRN Meds:.docusate sodium, ondansetron **OR** ondansetron (ZOFRAN) IV, oxyCODONE-acetaminophen, polyethylene glycol   OBJECTIVE: Blood pressure 109/67, pulse 65, temperature 97.6 F (36.4 C), temperature source Oral, resp. rate 18, height '5\' 9"'$  (1.753 m), weight 80.9 kg, SpO2 100 %.  Physical Exam  General/constitutional: no distress, pleasant HEENT: Normocephalic, PER, Conj Clear, EOMI, Oropharynx clear Neck supple CV: rrr no mrg Lungs: clear to auscultation, normal respiratory effort Abd: Soft, Nontender Ext: no edema Skin: No Rash Neuro: nonfocal MSK: right LE in splint; dressing clean/dry   Lab Results Lab Results  Component Value Date   WBC 4.2 07/21/2022   HGB 8.3 (L) 07/21/2022   HCT 24.5 (L) 07/21/2022   MCV 102.9 (H) 07/21/2022   PLT 135 (L) 07/21/2022    Lab Results  Component Value Date   CREATININE 7.10 (H) 07/20/2022   BUN 39 (H) 07/20/2022   NA 134 (L) 07/20/2022   K 4.4 07/20/2022   CL 95 (L) 07/20/2022   CO2 23 07/20/2022    Lab Results  Component Value Date   ALT 22 07/19/2022   AST 33 07/19/2022   ALKPHOS 100 07/19/2022   BILITOT 1.2 07/19/2022      Microbiology: Recent Results (from the past 240 hour(s))  Blood Cultures x 2 sites     Status: None (Preliminary result)   Collection Time: 07/18/22  8:00  PM   Specimen: BLOOD LEFT FOREARM  Result Value Ref Range Status   Specimen Description BLOOD LEFT FOREARM  Final   Special Requests   Final    BOTTLES DRAWN AEROBIC AND ANAEROBIC Blood Culture results may not be optimal due to an inadequate volume of blood received in culture bottles   Culture   Final    NO GROWTH 3 DAYS Performed at [facility], [address], [place], [place] [address]    Report Status PENDING  Incomplete  Blood Cultures x 2 sites     Status: None (Preliminary result)   Collection Time: 07/18/22  8:10 PM   Specimen: BLOOD LEFT HAND  Result Value Ref Range Status   Specimen Description BLOOD LEFT HAND  Final   Special Requests   Final    BOTTLES DRAWN AEROBIC AND ANAEROBIC Blood Culture results may not be optimal due to an excessive volume of blood received in culture bottles   Culture   Final    NO GROWTH 3 DAYS Performed at [facility], [place] [address], [place], [place] [address]    Report Status PENDING  Incomplete  Surgical pcr screen     Status: Abnormal   Collection Time: 07/20/22 12:43 AM   Specimen: Nasal Mucosa; Nasal Swab  Result Value Ref Range Status   MRSA, PCR NEGATIVE NEGATIVE Final   Staphylococcus aureus POSITIVE (A) NEGATIVE Final    Comment: (NOTE) The Xpert SA Assay (FDA approved for NASAL specimens in patients 50 years of age and older), is one component of a comprehensive surveillance program. It is not intended to diagnose infection nor to guide or monitor treatment. Performed at [facility], [place] [address] [place],  [address]  Aerobic/Anaerobic Culture w Gram Stain (surgical/deep wound)     Status: None (Preliminary result)   Collection Time: 07/20/22  6:38 PM   Specimen: Soft Tissue, Other  Result Value Ref Range Status   Specimen Description TISSUE  Final   Special Requests RT FORTH MPJ  Final   Gram Stain   Final    FEW WBC PRESENT, PREDOMINANTLY PMN NO ORGANISMS SEEN    Culture   Final    NO  GROWTH < 12 HOURS Performed at [facility], [address], [place], [place] [address]    Report Status PENDING  Incomplete  Aerobic/Anaerobic Culture w Gram Stain (surgical/deep wound)     Status: None (Preliminary result)   Collection Time: 07/20/22  6:49 PM   Specimen: Soft Tissue, Other  Result Value Ref Range Status   Specimen Description TISSUE  Final   Special Requests RT FOURTH METATARSAL  Final   Gram Stain   Final    FEW WBC PRESENT, PREDOMINANTLY PMN NO ORGANISMS SEEN    Culture   Final    NO GROWTH < 12 HOURS Performed at [facility], [place] [address], [place], [place] [address]    Report Status PENDING  Incomplete     Serology:    Imaging: If present, new imagings (plain films, ct scans, and mri) have been personally visualized and interpreted; radiology reports have been reviewed. Decision making incorporated into the Impression / Recommendations.  9/25 mri right foot 1. Osteomyelitis involving the fourth metatarsal distally as well as the proximal phalanx of the fourth digit. Possible plantar drainage from the distal fourth metatarsal given the overlying wound. 2. Diffuse subcutaneous edema in the forefoot, cellulitis not excluded. 3. Diffuse regional muscular edema, probably neurogenic. 4. Chronic appearing medial erosion along the first metatarsal head, possibly from gout.  Jabier Mutton, [facility] [place] [phone] pager    07/21/2022, 3:19 PM

## 2022-07-21 NOTE — Progress Notes (Signed)
   07/21/22 0039  Provider Notification  Provider Name/Title Dr. Alcario Drought  Date Provider Notified 07/21/22  Time Provider Notified [phone]  Method of Notification Page  Notification Reason Requested by patient/family (Right Foot Pain - 6/10)  Provider response See new orders  Date of Provider Response 07/21/22   Current pain medication is every 6 hours.  Patient is complaining of breakthrough pain to right foot.  Dr. Alcario Drought called and made aware.  Frequency changed to Q 3 hours.  Will continue to monitor patient.  Earleen Reaper RN

## 2022-07-21 NOTE — Progress Notes (Signed)
Pt receives out-pt HD at [facility] on MWF. Will assist as needed. Requested that pt be placed on 2nd shift for inpt HD tomorrow in the event pt stable for d/c tomorrow morning and clinic can treat as out-pt.   Melven Sartorius Renal Navigator [phone]

## 2022-07-21 NOTE — Evaluation (Signed)
Occupational Therapy Evaluation Patient Details Name: Jacob [name] MRN: [id number] DOB: 1951-10-21 Today's Date: 07/21/2022   History of Present Illness Pt is a 71 y/o male who admitted for osteomyelitis of R foot. Pt underwent 4th toe resection and I&D of plantar ulceration of R foot on 9/27. PMH: ESRD on HD, HIV, DM2 with neuropathy, HTN, and chronic diabetic ulcer of R foot.   Clinical Impression   PTA, pt lives alone in handicapped accessible senior apartments. Pt reports Modified Independence with ADLs, basic household IADLs and mobility using cane. Pt has assist for transportation. Pt presents now with no pain with mobility though with deficits in dynamic standing balance and safety awareness. Pt able to complete UB ADLs with Setup and LB ADLs with min guard. Pt adamant on using cane for mobility rather than RW though unsteadiness noted with min guard to correct LOB. Pt also reports plan to use knee scooter once delivered to home. Based on unsteadiness, would recommend HHOT follow up at DC though pt declines need for this or RW. Will continue to follow while admitted to maximize safety and independence.       Recommendations for follow up therapy are one component of a multi-disciplinary discharge planning process, led by the attending physician.  Recommendations may be updated based on patient status, additional functional criteria and insurance authorization.   Follow Up Recommendations  Home health OT (though may decline)    Assistance Recommended at Discharge Intermittent Supervision/Assistance  Patient can return home with the following A little help with walking and/or transfers;A little help with bathing/dressing/bathroom;Assistance with cooking/housework;Assist for transportation    Functional Status Assessment  Patient has had a recent decline in their functional status and demonstrates the ability to make significant improvements in function in a reasonable and predictable  amount of time.  Equipment Recommendations  Other (comment) (would recommend RW though pt declines need for RW)    Recommendations for Other Services       Precautions / Restrictions Precautions Precautions: Fall Required Braces or Orthoses: Other Brace Other Brace: post op shoe R foot Restrictions Weight Bearing Restrictions: Yes RLE Weight Bearing: Weight bearing as tolerated Other Position/Activity Restrictions: R post op shoe      Mobility Bed Mobility Overal bed mobility: Modified Independent                  Transfers Overall transfer level: Needs assistance Equipment used: Straight cane Transfers: Sit to/from Stand Sit to Stand: Min guard           General transfer comment: close by for safety      Balance Overall balance assessment: Needs assistance Sitting-balance support: No upper extremity supported, Feet supported Sitting balance-Leahy Scale: Good     Standing balance support: Single extremity supported, During functional activity Standing balance-Leahy Scale: Fair Standing balance comment: can briefly stand statically without support but loses balance with dynamic tasks                           ADL either performed or assessed with clinical judgement   ADL Overall ADL's : Needs assistance/impaired Eating/Feeding: Independent   Grooming: Min guard;Standing   Upper Body Bathing: Set up;Sitting   Lower Body Bathing: Min guard;Sit to/from stand   Upper Body Dressing : Set up;Sitting   Lower Body Dressing: Min guard;Sit to/from stand Lower Body Dressing Details (indicate cue type and reason): able to manage personal flat darco shoe with increased time and  figure four positioning Toilet Transfer: Min guard;Ambulation Toilet Transfer Details (indicate cue type and reason): ambulated to bathroom with cane with one LOB and min guard to correct Toileting- Clothing Manipulation and Hygiene: Min guard;Sit to/from stand        Functional mobility during ADLs: Min guard;Cane General ADL Comments: Pt determined to use cane for mobility though with intermittent LOB and min guard to correct.     Vision Baseline Vision/History: 1 Wears glasses Ability to See in Adequate Light: 0 Adequate Patient Visual Report: No change from baseline Vision Assessment?: No apparent visual deficits     Perception     Praxis      Pertinent Vitals/Pain Pain Assessment Pain Assessment: No/denies pain ("It's fine")     Hand Dominance Right   Extremity/Trunk Assessment Upper Extremity Assessment Upper Extremity Assessment: Overall WFL for tasks assessed   Lower Extremity Assessment Lower Extremity Assessment: Defer to PT evaluation   Cervical / Trunk Assessment Cervical / Trunk Assessment: Normal   Communication Communication Communication: No difficulties   Cognition Arousal/Alertness: Awake/alert Behavior During Therapy: WFL for tasks assessed/performed, Impulsive, Restless Overall Cognitive Status: No family/caregiver present to determine baseline cognitive functioning                                 General Comments: initially frustrated by lack of info from doctors, DC plan, etc. After time spent educated on MD names, who will see him today and goal of DC home - pt amenable to participate. follows directions though a bit impulsive at times. declines need for RW use though unsteady with cane     General Comments       Exercises     Shoulder Instructions      Home Living Family/patient expects to be discharged to:: Private residence Living Arrangements: Alone Available Help at Discharge: Family;Friend(s) Type of Home: Apartment Home Access: Level entry     Home Layout: One level     Bathroom Shower/Tub: Occupational psychologist: Handicapped height     Home Equipment: [place] - single point;Shower seat;Hand held shower head;Grab bars - tub/shower;Grab bars - toilet   Additional  Comments: lives in handicapped accessible senior apartments with pull lights in bathroom, reports getting a knee scooter delivered to his room      Prior Functioning/Environment Prior Level of Function : Needs assist             Mobility Comments: reports use of cane for mobility ADLs Comments: Mod I for ADLs, basic household IADLs. does not drive. uses [employer] to get to HD and family/friends to take him to grocery store, etc        OT Problem List: Impaired balance (sitting and/or standing);Decreased safety awareness;Decreased knowledge of use of DME or AE      OT Treatment/Interventions: Therapeutic exercise;Self-care/ADL training;DME and/or AE instruction;Therapeutic activities    OT Goals(Current goals can be found in the care plan section) Acute Rehab OT Goals Patient Stated Goal: go home today, know what time he is going home so he can notify others for knee scooter delivery OT Goal Formulation: With patient Time For Goal Achievement: 08/04/22 Potential to Achieve Goals: Good  OT Frequency: Min 2X/week    Co-evaluation              AM-PAC OT "6 Clicks" Daily Activity     Outcome Measure Help from another person eating meals?: None Help from  another person taking care of personal grooming?: A Little Help from another person toileting, which includes using toliet, bedpan, or urinal?: A Little Help from another person bathing (including washing, rinsing, drying)?: A Little Help from another person to put on and taking off regular upper body clothing?: A Little Help from another person to put on and taking off regular lower body clothing?: A Little 6 Click Score: 19   End of Session Equipment Utilized During Treatment: Other (comment) (cane) Nurse Communication: Mobility status  Activity Tolerance: Patient tolerated treatment well Patient left: in bed;with call bell/phone within reach  OT Visit Diagnosis: Unsteadiness on feet (R26.81);Other abnormalities of gait and  mobility (R26.89)                Time: 6256-3893 OT Time Calculation (min): 26 min Charges:  OT General Charges $OT Visit: 1 Visit OT Evaluation $OT Eval Moderate Complexity: 1 Mod OT Treatments $Self Care/Home Management : 8-22 mins  Malachy Chamber, OTR/L Acute Rehab Services Office: [phone]   Layla Maw 07/21/2022, 8:39 AM

## 2022-07-21 NOTE — Progress Notes (Signed)
PROGRESS NOTE    Jacob [name]  EXN:[id number] DOB: 1951/05/25 DOA: 07/18/2022 PCP: [name], MD   Brief Narrative:  HPI: Jacob [name] is a 71 y.o. male with medical history significant of ESRD on HD, diminutive Friday, HIV, type 2 diabetes mellitus with right foot diabetic wound, hypertension and several other comorbidities who presented to ED with a complaint of worsening swelling, pain and infection of the right foot diabetic ulcer.  Per patient, he has been having this diabetic foot ulcer since about 19 weeks and has been getting wound care as outpatient.  He has been told to not put too much weight on the right foot and has diabetic shoes as well but since his scooter has not arrived yet, he has not been able to comply with instructions resulting in worsening pain and wound.  He denies any fever, chills, sweating or any other complaint.  The last time he was seen by wound care was on 07/14/2022.  He is not on any antibiotics.  Apart from that, patient tells [place] that due to inability to walk well, he has not received his dialysis since Wednesday last week.  He denies any shortness of breath.   ED Course: Upon arrival to ED, he was hemodynamically stable.  Right foot x-ray was done which shows suspicion of osteomyelitis of the distal fourth metatarsal and base of the proximal right phalanx of the right foot.  MRI is pending.  Was found to have hyperkalemia.  Nephrology has been consulted by ED.  Per ED provider, podiatry has been paged out but they are waiting for callback.  Patient has not been provided with antibiotics yet since ED provider is waiting for instructions from podiatry.    Assessment & Plan:   Principal Problem:   Osteomyelitis ([place]) Active Problems:   Human immunodeficiency virus (HIV) disease ([place])   Essential hypertension   Coronary artery disease involving native heart without angina pectoris   Hyperkalemia   HTN (hypertension)   Anemia of chronic disease    Hyperlipidemia associated with type 2 diabetes mellitus ([place])   Diabetic foot ulcer ([place])   ESRD on hemodialysis ([place])  Right foot osteomyelitis: Seen by podiatry.  S/p fourth metatarsal head resection and fourth proximal phalanx partial resection right foot.  Irrigation and debridement by Dr. Loel Lofty on 07/20/2022.  Consulted ID.  They are waiting for cultures.  They recommend continuing current broad-spectrum antibiotics in the meantime.  Anemia of chronic disease: Patient's hemoglobin appears to be stable between 9 and 11.  He did mention that he was having black stools.  FOBT is positive as well but however hemoglobin labile but fairly in a stable range.  We will continue twice daily IV Protonix for now.  If at any given time, we see any evidence of pattern of dropping hemoglobin, we will consult GI for further work-up.   ESRD on HD, MWF schedule/hyperkalemia: Nephrology on board for dialysis.   Type 2 diabetes mellitus: Last hemoglobin A1c 3 months ago on 03/22/2022 was 5.9. Patient does not appear to be on any home medications for diabetes.  Continue SSI.   Anemia of chronic disease: Hemoglobin is stable.   Hyperlipidemia: Resume atorvastatin.   History of CAD with essential hypertension: Asymptomatic.  Continue Imdur, Coreg.   HIV: Continue Biktarvy.  Peripheral neuropathy: Continue Neurontin.  Intermittent delirium: Patient at times is showing signs of delirium, confusion.  However he is easily redirectable.  Continue Seroquel qhs.  DVT prophylaxis:  Code Status: Full Code  Family Communication:  None present at bedside.  Plan of care discussed with patient in length and he/she verbalized understanding and agreed with it.  Status is: Inpatient Remains inpatient appropriate because: Pending culture report to be finalized.  Estimated body mass index is 26.34 kg/m as calculated from the following:   Height as of this encounter: '5\' 9"'$  (1.753 m).   Weight as of this encounter:  80.9 kg.    Nutritional Assessment: Body mass index is 26.34 kg/m.[name] Seen by dietician.  I agree with the assessment and plan as outlined below: Nutrition Status:        . Skin Assessment: I have examined the patient's skin and I agree with the wound assessment as performed by the wound care RN as outlined below:    Consultants:  Podiatry Nephrology  Procedures:  As above  Antimicrobials:  Anti-infectives (From admission, onward)    Start     Dose/Rate Route Frequency Ordered Stop   07/20/22 1200  vancomycin (VANCOCIN) IVPB 1000 mg/200 mL premix        1,000 mg 200 mL/hr over 60 Minutes Intravenous Every M-W-F (Hemodialysis) 07/18/22 2146     07/19/22 1000  bictegravir-emtricitabine-tenofovir AF (BIKTARVY) 50-200-25 MG per tablet 1 tablet        1 tablet Oral Daily 07/18/22 1716     07/18/22 2330  vancomycin (VANCOCIN) IVPB 1000 mg/200 mL premix  Status:  Discontinued        1,000 mg 200 mL/hr over 60 Minutes Intravenous Every M-W-F (Hemodialysis) 07/18/22 2006 07/18/22 2146   07/18/22 2000  ceFEPIme (MAXIPIME) 1 g in sodium chloride 0.9 % 100 mL IVPB        1 g 200 mL/hr over 30 Minutes Intravenous Every 24 hours 07/18/22 1941     07/18/22 2000  vancomycin (VANCOREADY) IVPB 1750 mg/350 mL        1,750 mg 175 mL/hr over 120 Minutes Intravenous  Once 07/18/22 1941 07/19/22 0629   07/18/22 1930  metroNIDAZOLE (FLAGYL) IVPB 500 mg        500 mg 100 mL/hr over 60 Minutes Intravenous Every 12 hours 07/18/22 1917 07/25/22 2159         Subjective:  Patient seen and examined.  He has no complaints.  He wanted to go home.  Objective: Vitals:   07/21/22 0500 07/21/22 0513 07/21/22 0607 07/21/22 0955  BP:  112/63 111/61 109/67  Pulse:  72 69 65  Resp:  '18 17 18  '$ Temp:  98.3 F (36.8 C) 98 F (36.7 C) 97.6 F (36.4 C)  TempSrc:  Oral Oral Oral  SpO2:  100%    Weight: 80.9 kg     Height:        Intake/Output Summary (Last 24 hours) at 07/21/2022 1450 Last data  filed at 07/21/2022 0754 Gross per 24 hour  Intake 1290 ml  Output 10 ml  Net 1280 ml    Filed Weights   07/20/22 1313 07/20/22 2009 07/21/22 0500  Weight: 82.2 kg 80.9 kg 80.9 kg    Examination:  General exam: Appears calm and comfortable  Respiratory system: Clear to auscultation. Respiratory effort normal. Cardiovascular system: S1 & S2 heard, RRR. No JVD, murmurs, rubs, gallops or clicks. No pedal edema. Gastrointestinal system: Abdomen is nondistended, soft and nontender. No organomegaly or masses felt. Normal bowel sounds heard. Central nervous system: Alert and oriented. No focal neurological deficits. Extremities: Dressing in the right foot. Psychiatry: Judgement and insight appear poor  Data Reviewed: I have personally reviewed following labs and imaging studies  CBC: Recent Labs  Lab 07/18/22 1240 07/18/22 1715 07/19/22 0336 07/20/22 0601 07/20/22 1703 07/21/22 0428  WBC 7.4 6.3 5.9 4.5  --  4.2  NEUTROABS 4.2  --   --   --   --   --   HGB 9.5* 10.3* 9.9* 8.6* 10.9* 8.3*  HCT 29.9* 31.0* 29.9* 26.0* 32.0* 24.5*  MCV 106.8* 104.4* 103.1* 104.8*  --  102.9*  PLT 195 197 171 149*  --  135*    Basic Metabolic Panel: Recent Labs  Lab 07/18/22 1240 07/18/22 1715 07/19/22 0336 07/20/22 0601 07/20/22 1703  NA 140  --  134* 136 134*  K 6.5*  --  4.7 4.6 4.4  CL 93*  --  94* 93* 95*  CO2 26  --  27 23  --   GLUCOSE 87  --  142* 145* 143*  BUN 144*  --  60* 77* 39*  CREATININE 15.45* 15.58* 8.25* 10.73* 7.10*  CALCIUM 8.6*  --  8.4* 8.8*  --   MG  --  2.7*  --  2.0  --   PHOS  --   --   --  6.9*  --     GFR: Estimated Creatinine Clearance: 9.3 mL/min (A) (by C-G formula based on SCr of 7.1 mg/dL (H)). Liver Function Tests: Recent Labs  Lab 07/18/22 1240 07/19/22 0336 07/20/22 0601  AST 34 33  --   ALT 23 22  --   ALKPHOS 88 100  --   BILITOT 0.7 1.2  --   PROT 7.6 7.7  --   ALBUMIN 3.1* 3.1* 2.9*    Recent Labs  Lab 07/18/22 1240  LIPASE  31    No results for input(s): "AMMONIA" in the last 168 hours. Coagulation Profile: No results for input(s): "INR", "PROTIME" in the last 168 hours. Cardiac Enzymes: No results for input(s): "CKTOTAL", "CKMB", "CKMBINDEX", "TROPONINI" in the last 168 hours. BNP (last 3 results) No results for input(s): "PROBNP" in the last 8760 hours. HbA1C: No results for input(s): "HGBA1C" in the last 72 hours. CBG: Recent Labs  Lab 07/20/22 1352 07/20/22 1620 07/20/22 2116 07/21/22 0745 07/21/22 1144  GLUCAP 125* 155* 188* 138* 149*    Lipid Profile: No results for input(s): "CHOL", "HDL", "LDLCALC", "TRIG", "CHOLHDL", "LDLDIRECT" in the last 72 hours. Thyroid Function Tests: No results for input(s): "TSH", "T4TOTAL", "FREET4", "T3FREE", "THYROIDAB" in the last 72 hours. Anemia Panel: No results for input(s): "VITAMINB12", "FOLATE", "FERRITIN", "TIBC", "IRON", "RETICCTPCT" in the last 72 hours. Sepsis Labs: Recent Labs  Lab 07/18/22 1240  LATICACIDVEN 1.6     Recent Results (from the past 240 hour(s))  Blood Cultures x 2 sites     Status: None (Preliminary result)   Collection Time: 07/18/22  8:00 PM   Specimen: BLOOD LEFT FOREARM  Result Value Ref Range Status   Specimen Description BLOOD LEFT FOREARM  Final   Special Requests   Final    BOTTLES DRAWN AEROBIC AND ANAEROBIC Blood Culture results may not be optimal due to an inadequate volume of blood received in culture bottles   Culture   Final    NO GROWTH 3 DAYS Performed at [facility], [place] [address] [place], [place] [address]    Report Status PENDING  Incomplete  Blood Cultures x 2 sites     Status: None (Preliminary result)   Collection Time: 07/18/22  8:10 PM   Specimen:  BLOOD LEFT HAND  Result Value Ref Range Status   Specimen Description BLOOD LEFT HAND  Final   Special Requests   Final    BOTTLES DRAWN AEROBIC AND ANAEROBIC Blood Culture results may not be optimal due to an excessive volume of blood  received in culture bottles   Culture   Final    NO GROWTH 3 DAYS Performed at [facility] [address] [place], [place] [address]    Report Status PENDING  Incomplete  Surgical pcr screen     Status: Abnormal   Collection Time: 07/20/22 12:43 AM   Specimen: Nasal Mucosa; Nasal Swab  Result Value Ref Range Status   MRSA, PCR NEGATIVE NEGATIVE Final   Staphylococcus aureus POSITIVE (A) NEGATIVE Final    Comment: (NOTE) The Xpert SA Assay (FDA approved for NASAL specimens in patients 61 years of age and older), is one component of a comprehensive surveillance program. It is not intended to diagnose infection nor to guide or monitor treatment. Performed at [facility], [place] [address], [place], [place] [address]   Aerobic/Anaerobic Culture w Gram Stain (surgical/deep wound)     Status: None (Preliminary result)   Collection Time: 07/20/22  6:38 PM   Specimen: Soft Tissue, Other  Result Value Ref Range Status   Specimen Description TISSUE  Final   Special Requests RT FORTH MPJ  Final   Gram Stain   Final    FEW WBC PRESENT, PREDOMINANTLY PMN NO ORGANISMS SEEN    Culture   Final    NO GROWTH < 12 HOURS Performed at [facility], [place] [address] [place], [place] [address]    Report Status PENDING  Incomplete  Aerobic/Anaerobic Culture w Gram Stain (surgical/deep wound)     Status: None (Preliminary result)   Collection Time: 07/20/22  6:49 PM   Specimen: Soft Tissue, Other  Result Value Ref Range Status   Specimen Description TISSUE  Final   Special Requests RT FOURTH METATARSAL  Final   Gram Stain   Final    FEW WBC PRESENT, PREDOMINANTLY PMN NO ORGANISMS SEEN    Culture   Final    NO GROWTH < 12 HOURS Performed at [facility], [place] [address], [place], [place] [address]    Report Status PENDING  Incomplete     Radiology Studies: VAS [place] ABI WITH/WO TBI  Result Date: 07/20/2022  LOWER EXTREMITY DOPPLER STUDY Patient Name:  Jacob [name]   Date of Exam:   07/20/2022 Medical Rec #: [id number]        Accession #:    [id number] Date of Birth: 05-08-1951         Patient Gender: M Patient Age:   1 years Exam Location:  [facility] Procedure:      VAS [place] ABI WITH/WO TBI Referring Phys: [place] Coil [place] --------------------------------------------------------------------------------  Indications: Ulceration. High Risk Factors: Hypertension, hyperlipidemia, Diabetes.  Comparison Study: No prior study Performing Technologist: Maudry Mayhew MHA, RVT, RDCS, RDMS  Examination Guidelines: A complete evaluation includes at minimum, Doppler waveform signals and systolic blood pressure reading at the level of bilateral brachial, anterior tibial, and posterior tibial arteries, when vessel segments are accessible. Bilateral testing is considered an integral part of a complete examination. Photoelectric Plethysmograph (PPG) waveforms and toe systolic pressure readings are included as required and additional duplex testing as needed. Limited examinations for reoccurring indications may be performed as noted.  ABI Findings: +---------+------------------+-----+----------+---------+ Right    Rt  Pressure (mmHg)IndexWaveform  Comment   +---------+------------------+-----+----------+---------+ Brachial                                  HD Access +---------+------------------+-----+----------+---------+ PTA      139               0.95 monophasic          +---------+------------------+-----+----------+---------+ DP       175               1.19 triphasic           +---------+------------------+-----+----------+---------+ Great Toe66                0.45                     +---------+------------------+-----+----------+---------+ +---------+------------------+-----+---------+---------------------------------+ Left     Lt Pressure (mmHg)IndexWaveform Comment                            +---------+------------------+-----+---------+---------------------------------+ Brachial 147                    triphasic                                  +---------+------------------+-----+---------+---------------------------------+ PTA      163               1.11 triphasic                                  +---------+------------------+-----+---------+---------------------------------+ DP       169               1.15 triphasic                                  +---------+------------------+-----+---------+---------------------------------+ Great Toe                       Present  Unable to place cuff around great                                          toe secondary to toe deformity.   +---------+------------------+-----+---------+---------------------------------+ +-------+-----------+--------------------------------+------------+------------+ ABI/TBIToday's ABIToday's TBI                     Previous ABIPrevious TBI +-------+-----------+--------------------------------+------------+------------+ Right  1.19       1.15                                                     +-------+-----------+--------------------------------+------------+------------+ Left   0.45       Unable to obtain pressure- PPG                                             waveform present.                                        +-------+-----------+--------------------------------+------------+------------+  Summary: Right: Resting right ankle-brachial index is within normal range. The right toe-brachial index is abnormal. Left: Resting left ankle-brachial index is within normal range. *See table(s) above for measurements and observations.  Electronically signed by Harold Barban MD on 07/20/2022 at 9:05:52 PM.    Final    DG Foot 2 Views Right  Result Date: 07/20/2022 CLINICAL DATA:  Irrigation and debridement of right foot wound EXAM: RIGHT FOOT - 2 VIEW COMPARISON:  07/18/2022  FINDINGS: Interval resection of the distal portion of the right 4th metatarsal and proximal 4th phalanx. No additional acute bony abnormality. No subluxation or dislocation. Degenerative changes and hallux valgus deformity at the 1st MTP joint. IMPRESSION: Interval resection of the distal 4th metatarsal and proximal aspect of the right 4th toe proximal phalanx. Electronically Signed   By: Rolm Baptise M.D.   On: 07/20/2022 20:32    Scheduled Meds:  atorvastatin  10 mg Oral Daily   bictegravir-emtricitabine-tenofovir AF  1 tablet Oral Daily   calcitRIOL  1 mcg Oral Q M,W,F   calcium acetate  2,001 mg Oral TID WC   carvedilol  3.125 mg Oral BID WC   Chlorhexidine Gluconate Cloth  6 each Topical Q0600   cinacalcet  30 mg Oral Q M,W,F-HD   [START ON 07/22/2022] darbepoetin (ARANESP) injection - DIALYSIS  200 mcg Intravenous Q Fri-HD   folic acid  1 mg Oral Daily   gabapentin  300 mg Oral BID   insulin aspart  0-15 Units Subcutaneous TID WC   insulin aspart  0-5 Units Subcutaneous QHS   isosorbide mononitrate  30 mg Oral Daily   latanoprost  1 drop Both Eyes QHS   mupirocin ointment  1 Application Nasal BID   octreotide  10 mg Intramuscular Once   pantoprazole (PROTONIX) IV  40 mg Intravenous Q12H   sodium chloride flush  3 mL Intravenous Q12H   timolol  1 drop Both Eyes q AM   Continuous Infusions:  ceFEPime (MAXIPIME) IV 1 g (07/20/22 2258)   metronidazole 500 mg (07/21/22 0908)   vancomycin Stopped (07/20/22 1245)     LOS: 3 days   Darliss Cheney, MD [facility]  07/21/2022, 2:50 PM   *Please note that this is a verbal dictation therefore any spelling or grammatical errors are due to the "[place]" system interpretation.  Please page via [place] and do not message via secure chat for urgent patient care matters. Secure chat can be used for non urgent patient care matters.  How to contact the [facility] Attending or Consulting provider [place] or covering provider during  after hours [place], for this patient?  Check the care team in [facility] and look for a) attending/consulting [facility] provider listed and b) the [facility] team listed. Page or secure chat [phone]. Log into [email or web address] and use [place]'s universal password to access. If you do not have the password, please contact the hospital operator. Locate the [facility] provider you are looking for under [facility] and page to a number that you can be directly reached. If you still have difficulty reaching the provider, please page the [facility] (Director on Call) for the Hospitalists listed on amion for assistance.

## 2022-07-21 NOTE — Progress Notes (Addendum)
[facility] Progress Note   Subjective: Seen sitting up at bedside. S/P debridement, revision of 4th metatarsal 07/20/2022 per podiarty.  R foot with ACE wrap, off loading shoe.  Objective Vitals:   07/20/22 2009 07/21/22 0500 07/21/22 0513 07/21/22 0607  BP: 127/67  112/63 111/61  Pulse: 72  72 69  Resp: '17  18 17  '$ Temp: 98.6 F (37 C)  98.3 F (36.8 C) 98 F (36.7 C)  TempSrc: Oral  Oral Oral  SpO2: 100%  100%   Weight: 80.9 kg 80.9 kg    Height:       Physical Exam General: Chronically ill appearing male in NAD Heart:S1,S2 RRR SR on monitor rate 70s Lungs: CTAB Abdomen: NABS, NT Extremities: No LE edema. ACE wrap r foot, off loading shoe.  Dialysis Access: R AVG + T/B   Additional Objective Labs: Basic Metabolic Panel: Recent Labs  Lab 07/18/22 1240 07/18/22 1715 07/19/22 0336 07/20/22 0601 07/20/22 1703  NA 140  --  134* 136 134*  K 6.5*  --  4.7 4.6 4.4  CL 93*  --  94* 93* 95*  CO2 26  --  27 23  --   GLUCOSE 87  --  142* 145* 143*  BUN 144*  --  60* 77* 39*  CREATININE 15.45*   < > 8.25* 10.73* 7.10*  CALCIUM 8.6*  --  8.4* 8.8*  --   PHOS  --   --   --  6.9*  --    < > = values in this interval not displayed.   Liver Function Tests: Recent Labs  Lab 07/18/22 1240 07/19/22 0336 07/20/22 0601  AST 34 33  --   ALT 23 22  --   ALKPHOS 88 100  --   BILITOT 0.7 1.2  --   PROT 7.6 7.7  --   ALBUMIN 3.1* 3.1* 2.9*   Recent Labs  Lab 07/18/22 1240  LIPASE 31   CBC: Recent Labs  Lab 07/18/22 1240 07/18/22 1715 07/19/22 0336 07/20/22 0601 07/20/22 1703 07/21/22 0428  WBC 7.4 6.3 5.9 4.5  --  4.2  NEUTROABS 4.2  --   --   --   --   --   HGB 9.5* 10.3* 9.9* 8.6* 10.9* 8.3*  HCT 29.9* 31.0* 29.9* 26.0* 32.0* 24.5*  MCV 106.8* 104.4* 103.1* 104.8*  --  102.9*  PLT 195 197 171 149*  --  135*   Blood Culture    Component Value Date/Time   SDES TISSUE 07/20/2022 1849   SPECREQUEST RT FOURTH METATARSAL 07/20/2022 1849    CULT  07/20/2022 1849    NO GROWTH < 12 HOURS Performed at [facility], [place] [address], [place], [place] [address]    REPTSTATUS PENDING 07/20/2022 1849    Cardiac Enzymes: No results for input(s): "CKTOTAL", "CKMB", "CKMBINDEX", "TROPONINI" in the last 168 hours. CBG: Recent Labs  Lab 07/20/22 0732 07/20/22 1352 07/20/22 1620 07/20/22 2116 07/21/22 0745  GLUCAP 130* 125* 155* 188* 138*   Iron Studies: No results for input(s): "IRON", "TIBC", "TRANSFERRIN", "FERRITIN" in the last 72 hours. '@lablastinr3'$ @ Studies/Results: VAS [place] ABI WITH/WO TBI  Result Date: 07/20/2022  LOWER EXTREMITY DOPPLER STUDY Patient Name:  Jacob [name]  Date of Exam:   07/20/2022 Medical Rec #: [id number]        Accession #:    [id number] Date of Birth: [birth year]         Patient Gender: M Patient Age:   71  years Exam Location:  [facility] Procedure:      VAS [place] ABI WITH/WO TBI Referring Phys: Ames Coupe --------------------------------------------------------------------------------  Indications: Ulceration. High Risk Factors: Hypertension, hyperlipidemia, Diabetes.  Comparison Study: No prior study Performing Technologist: Maudry Mayhew MHA, RVT, RDCS, RDMS  Examination Guidelines: A complete evaluation includes at minimum, Doppler waveform signals and systolic blood pressure reading at the level of bilateral brachial, anterior tibial, and posterior tibial arteries, when vessel segments are accessible. Bilateral testing is considered an integral part of a complete examination. Photoelectric Plethysmograph (PPG) waveforms and toe systolic pressure readings are included as required and additional duplex testing as needed. Limited examinations for reoccurring indications may be performed as noted.  ABI Findings: +---------+------------------+-----+----------+---------+ Right    Rt Pressure (mmHg)IndexWaveform  Comment   +---------+------------------+-----+----------+---------+  Brachial                                  HD Access +---------+------------------+-----+----------+---------+ PTA      139               0.95 monophasic          +---------+------------------+-----+----------+---------+ DP       175               1.19 triphasic           +---------+------------------+-----+----------+---------+ Great Toe66                0.45                     +---------+------------------+-----+----------+---------+ +---------+------------------+-----+---------+---------------------------------+ Left     Lt Pressure (mmHg)IndexWaveform Comment                           +---------+------------------+-----+---------+---------------------------------+ Brachial 147                    triphasic                                  +---------+------------------+-----+---------+---------------------------------+ PTA      163               1.11 triphasic                                  +---------+------------------+-----+---------+---------------------------------+ DP       169               1.15 triphasic                                  +---------+------------------+-----+---------+---------------------------------+ Great Toe                       Present  Unable to place cuff around great                                          toe secondary to toe deformity.   +---------+------------------+-----+---------+---------------------------------+ +-------+-----------+--------------------------------+------------+------------+ ABI/TBIToday's ABIToday's TBI                     Previous ABIPrevious TBI +-------+-----------+--------------------------------+------------+------------+ Right  1.19       1.15                                                     +-------+-----------+--------------------------------+------------+------------+ Left   0.45       Unable to obtain pressure- PPG                                              waveform present.                                        +-------+-----------+--------------------------------+------------+------------+  Summary: Right: Resting right ankle-brachial index is within normal range. The right toe-brachial index is abnormal. Left: Resting left ankle-brachial index is within normal range. *See table(s) above for measurements and observations.  Electronically signed by Harold Barban MD on 07/20/2022 at 9:05:52 PM.    Final    DG Foot 2 Views Right  Result Date: 07/20/2022 CLINICAL DATA:  Irrigation and debridement of right foot wound EXAM: RIGHT FOOT - 2 VIEW COMPARISON:  07/18/2022 FINDINGS: Interval resection of the distal portion of the right 4th metatarsal and proximal 4th phalanx. No additional acute bony abnormality. No subluxation or dislocation. Degenerative changes and hallux valgus deformity at the 1st MTP joint. IMPRESSION: Interval resection of the distal 4th metatarsal and proximal aspect of the right 4th toe proximal phalanx. Electronically Signed   By: Rolm Baptise M.D.   On: 07/20/2022 20:32   DG Foot Complete Right  Result Date: 07/19/2022 Please see detailed radiograph report in office note.  Medications:  ceFEPime (MAXIPIME) IV 1 g (07/20/22 2258)   metronidazole 500 mg (07/21/22 0908)   vancomycin Stopped (07/20/22 1245)    atorvastatin  10 mg Oral Daily   bictegravir-emtricitabine-tenofovir AF  1 tablet Oral Daily   calcitRIOL  1 mcg Oral Q M,W,F   calcium acetate  2,001 mg Oral TID WC   carvedilol  3.125 mg Oral BID WC   Chlorhexidine Gluconate Cloth  6 each Topical Q0600   cinacalcet  30 mg Oral Q M,W,F-HD   folic acid  1 mg Oral Daily   gabapentin  300 mg Oral BID   heparin  5,000 Units Subcutaneous Q8H   insulin aspart  0-15 Units Subcutaneous TID WC   insulin aspart  0-5 Units Subcutaneous QHS   isosorbide mononitrate  30 mg Oral Daily   latanoprost  1 drop Both Eyes QHS   mupirocin ointment  1 Application Nasal BID   pantoprazole  (PROTONIX) IV  40 mg Intravenous Q12H   sodium chloride flush  3 mL Intravenous Q12H   timolol  1 drop Both Eyes q AM     Dialysis Orders: Center:[facility] MWF 3 hr 45 min 180NRe 400/Autoflow 1.5 82 kg 2.0 K/2.0 Ca AVG -No heparin -Mircera 75 mcg IV q 2 weeks (last dose 07/06/2022) -Calcitriol 1.0 mcg PO TIW -Sensipar 30 mg PO TIW   Assessment/Plan:  Volume overload in setting of missed HD-Vascular congestion on CXR with LE edema. S/p HD 9/25, 9/27. Resolved with HD.   Hyperkalemia-Given lokelma in ED. Use 2.0 K bath with HD, resolved  Osteomyelitis R foot-W/U per primary. Podiatry consulted. On vanc/ cefepime. S/P 4th metatarsal head resection, right foot. 4th proximal phalanx partial resection, right foot 07/20/2022 per podiatry.   Possible GIB-BUN elevated. Azotemia without signs of uremia. Reports black stools. HGB 11.0 07/13/2022. HGB down to 8.3 07/21/2022. FOBT positive.  Pt has been refusing SQ heparin. Per primary  ESRD -  MWF-missed HD 07/15/2022. Next HD 07/22/2022  No Heparin.   Hypertension/volume  - As noted above. BP well controlled. Low dose carvedilol on OP med list. Has been ordered. Now under OP EDW. Lower EDW on discharge.   Anemia  - As noted above. ESA due 07/20/2022. Will order. Follow HGB. Transfuse as needed.   Metabolic bone disease -  OP PO4 at goal, slightly elevated here. Continue binders, VDRA & Sensipar.  Nutrition - Albumin low. Changed to renal/carb mod diet with protein supplements, nepro  DMT2-per primary HIV-meds per primary  Antwyne Pingree H. Arisbeth Purrington NP-C 07/21/2022, 9:44 AM  [employer] [phone]

## 2022-07-22 ENCOUNTER — Telehealth: Payer: Self-pay | Admitting: *Deleted

## 2022-07-22 DIAGNOSIS — N186 End stage renal disease: Secondary | ICD-10-CM

## 2022-07-22 DIAGNOSIS — M86071 Acute hematogenous osteomyelitis, right ankle and foot: Secondary | ICD-10-CM | POA: Diagnosis not present

## 2022-07-22 DIAGNOSIS — Z992 Dependence on renal dialysis: Secondary | ICD-10-CM

## 2022-07-22 LAB — GLUCOSE, CAPILLARY
Glucose-Capillary: 139 mg/dL — ABNORMAL HIGH (ref 70–99)
Glucose-Capillary: 52 mg/dL — ABNORMAL LOW (ref 70–99)
Glucose-Capillary: 168 mg/dL — ABNORMAL HIGH (ref 70–99)

## 2022-07-22 LAB — SURGICAL PATHOLOGY

## 2022-07-22 MED ORDER — SODIUM CHLORIDE 0.9 % IV SOLN
2.0000 g | INTRAVENOUS | Status: DC
  Filled 2022-07-22: qty 2

## 2022-07-22 MED ORDER — OMEPRAZOLE 40 MG PO CPDR: 40.0000 mg | DELAYED_RELEASE_CAPSULE | Freq: Two times a day (BID) | ORAL | 0 refills | Status: DC

## 2022-07-22 MED ORDER — VANCOMYCIN HCL IN DEXTROSE 1-5 GM/200ML-% IV SOLN: 1000.0000 mg | INTRAVENOUS | Status: AC

## 2022-07-22 MED ORDER — SODIUM CHLORIDE 0.9 % IV SOLN: 2.0000 g | INTRAVENOUS | Status: AC

## 2022-07-22 MED ORDER — SODIUM CHLORIDE 0.9 % IV SOLN: 2.0000 g | INTRAVENOUS | Status: DC

## 2022-07-22 NOTE — Progress Notes (Signed)
Jacob [name] SWOT RN explained most of the discharge instruction. Patient didn't want her to finished going over the summary. He said he would read it himself because he wanted to eat. Marita [place] returned all of patient's personal medications from pharmacy. I removed patient's PIV. Patient's RN will be made aware that patient has his discharge instruction and cab voucher.

## 2022-07-22 NOTE — Discharge Summary (Signed)
Physician Discharge Summary  Jacob [name] OZH:[id number] DOB: March 28, 1951 DOA: 07/18/2022  PCP: [name], MD  Admit date: 07/18/2022 Discharge date: 07/22/2022 30 Day Unplanned Readmission Risk Score    Flowsheet Row ED to Hosp-Admission (Current) from 07/18/2022 in [facility]M KIDNEY UNIT  30 Day Unplanned Readmission Risk Score (%) 40.69 Filed at 07/22/2022 1200       This score is the patient's risk of an unplanned readmission within 30 days of being discharged (0 -100%). The score is based on dignosis, age, lab data, medications, orders, and past utilization.   Low:  0-14.9   Medium: 15-21.9   High: 22-29.9   Extreme: 30 and above          Admitted From: Home Disposition: Home  Recommendations for Outpatient Follow-up:  Follow up with PCP in 1-2 weeks Please obtain BMP/CBC in one week For low up with ID Dr [name] 10/12 @ 9am Please follow up with your PCP on the following pending results: Unresulted Labs (From admission, onward)     Start     Ordered   Unscheduled  Occult blood card to lab, stool  As needed,   R      07/18/22 1726   Unscheduled  Occult blood card to lab, stool  As needed,   R      07/19/22 1425   Signed and Held  Renal function panel  Once,   R        Signed and Held   Signed and Held  CBC  Once,   R        Signed and [place]: None Equipment/Devices: None  Discharge Condition: Stable CODE STATUS: Full code Diet recommendation: Diabetic  Subjective: Patient seen and examined.  He has no complaints.  He is very adamant on going home today.  Brief/Interim Summary: Jacob [name] is a 71 y.o. male with medical history significant of ESRD on HD, diminutive Friday, HIV, type 2 diabetes mellitus with right foot diabetic wound, hypertension and several other comorbidities who presented to ED with a complaint of worsening swelling, pain and infection of the right foot diabetic ulcer.  Per patient, he has  been having this diabetic foot ulcer since about 19 weeks and has been getting wound care as outpatient.  He has been told to not put too much weight on the right foot and has diabetic shoes as well but since his scooter has not arrived yet, he has not been able to comply with instructions resulting in worsening pain and wound.  He denies any fever, chills, sweating or any other complaint.  The last time he was seen by wound care was on 07/14/2022.  He is not on any antibiotics.  Apart from that, patient tells [place] that due to inability to walk well, he has not received his dialysis since Wednesday last week.  He denies any shortness of breath.   ED Course: Upon arrival to ED, he was hemodynamically stable.  Right foot x-ray was done which shows suspicion of osteomyelitis of the distal fourth metatarsal and base of the proximal right phalanx of the right foot.  MRI is pending.  Was found to have hyperkalemia.  Nephrology has been consulted by ED.  Per ED provider, podiatry has been paged out but they are waiting for callback.  Patient has not been provided with antibiotics yet since ED provider is waiting for  instructions from podiatry.   Right foot osteomyelitis: Seen by podiatry.  S/p fourth metatarsal head resection and fourth proximal phalanx partial resection right foot.  Irrigation and debridement by Dr. Loel Lofty on 07/20/2022.  Consulted ID.  Cultures are not finalized yet but it looks like they are growing something.  Patient is very adamant on going home and due to that, ID has recommended vancomycin plus ceftaz with dialysis 3 times weekly for 6 weeks.  They will follow-up for the final cultures as outpatient and make adjustments if necessary.  He was assessed by PT OT who did not recommend any home health or SNF.   Anemia of chronic disease: Patient's hemoglobin appears to be stable between 9 and 11.  He did mention that he was having black stools.  FOBT is positive as well but however hemoglobin  labile but fairly in a stable range.  We started him on twice daily Protonix IV.  Patient's hemoglobin yesterday was 8.3.  Repeat CBC was ordered but patient declined and he does not want to stay in the hospital for any further work-up if needed.  I will discharge him on twice daily oral Protonix for 28 days.   ESRD on HD, MWF schedule/hyperkalemia: Resolved.  He received scheduled dialysis by nephrology, currently getting his dialysis and will resume on Monday as outpatient.   Type 2 diabetes mellitus: Last hemoglobin A1c 3 months ago on 03/22/2022 was 5.9. Patient does not appear to be on any home medications for diabetes.  Continue SSI.   Anemia of chronic disease: Hemoglobin is stable.   Hyperlipidemia: Resume atorvastatin.   History of CAD with essential hypertension: Asymptomatic.  Continue Imdur, Coreg.   HIV: Continue Biktarvy.   Peripheral neuropathy: Continue Neurontin.   Intermittent delirium: Resolved.  Discharge plan was discussed with patient and/or family member and they verbalized understanding and agreed with it.  Discharge Diagnoses:  Principal Problem:   Osteomyelitis ([place]) Active Problems:   Human immunodeficiency virus (HIV) disease ([place])   Essential hypertension   Coronary artery disease involving native heart without angina pectoris   Hyperkalemia   HTN (hypertension)   Anemia of chronic disease   Hyperlipidemia associated with type 2 diabetes mellitus ([place])   Diabetic foot ulcer ([place])   ESRD on hemodialysis [facility])    Discharge Instructions   Allergies as of 07/22/2022       Reactions   Augmentin [amoxicillin-pot Clavulanate] Diarrhea, Other (See Comments)   Severe diarrhea   Morphine Sulfate Anaphylaxis   Can't mix with oxycodone    Mucinex Fast-max Other (See Comments)   Intense sweating    Amphetamines Other (See Comments)   Unknown reaction        Medication List     STOP taking these medications    colchicine 0.6 MG tablet        TAKE these medications    acetaminophen 650 MG CR tablet Commonly known as: TYLENOL Take 1,300 mg by mouth 3 (three) times daily.   allopurinol 100 MG tablet Commonly known as: ZYLOPRIM Take one tablet by mouth once daily.   Anoro Ellipta 62.5-25 MCG/ACT Aepb Generic drug: umeclidinium-vilanterol INHALE 1 PUFF BY MOUTH EVERY DAY   atorvastatin 10 MG tablet Commonly known as: LIPITOR TAKE 1 TABLET(10 MG) BY MOUTH DAILY   B-D UF III MINI PEN NEEDLES 31G X 5 MM Misc Generic drug: Insulin Pen Needle USE 4 TIMES DAILY   Biktarvy 50-200-25 MG Tabs tablet Generic drug: bictegravir-emtricitabine-tenofovir AF Take 1 tablet by mouth  daily.   Calcium Acetate 667 MG Tabs Take 667-2,001 mg by mouth See admin instructions. Take 2001 mg by mouth three times a day with meals and 667 mg with each snack   carvedilol 3.125 MG tablet Commonly known as: COREG Take 1 tablet (3.125 mg total) by mouth 2 (two) times daily with a meal. CALL AND SCHEDULE 1 YEAR FOLLOW UP OFFICE VISIT TO RECEIVE FURTHER REFILLS. [phone].   cefTAZidime 2 g in sodium chloride 0.9 % 100 mL Inject 2 g into the vein every Monday, Wednesday, and Friday at 8 PM.   diclofenac Sodium 1 % Gel Commonly known as: VOLTAREN APPLY 2 GRAMS TOPICALLY TO THE AFFECTED AREA THREE TIMES DAILY AS NEEDED FOR PAIN   docusate sodium 100 MG capsule Commonly known as: COLACE Take 100 mg by mouth daily as needed for mild constipation.   folic acid 1 MG tablet Commonly known as: FOLVITE TAKE 1 TABLET(1 MG) BY MOUTH DAILY   gabapentin 300 MG capsule Commonly known as: NEURONTIN TAKE 1 CAPSULE BY MOUTH DAILY.   gentamicin ointment 0.1 % Commonly known as: GARAMYCIN Apply 1 Application topically daily.   insulin lispro 100 UNIT/ML KwikPen Commonly known as: HUMALOG INJECT 20 UNITS UNDER THE SKIN THREE TIMES DAILY AS NEEDED FOR HIGH BLOOD SUGAR(ABOVE 150)   isosorbide mononitrate 30 MG 24 hr tablet Commonly known as:  IMDUR Take 1 tablet (30 mg total) by mouth daily.   latanoprost 0.005 % ophthalmic solution Commonly known as: XALATAN Place 1 drop into both eyes at bedtime.   lidocaine-prilocaine cream Commonly known as: EMLA Apply 1 application topically every Monday, Wednesday, and Friday with hemodialysis.   multivitamin Tabs tablet Take 1 tablet by mouth daily.   nitroGLYCERIN 0.3 MG SL tablet Commonly known as: NITROSTAT PLACE 1 TABLET UNDER THE TONGUE AS NEEDED FOR CHEST PAIN   omeprazole 40 MG capsule Commonly known as: PRILOSEC Take 1 capsule (40 mg total) by mouth 2 (two) times daily for 28 days.   ondansetron 4 MG tablet Commonly known as: Zofran Take 1 tablet (4 mg total) by mouth every 8 (eight) hours as needed for nausea or vomiting.   OneTouch Delica Plus [id number] Misc 1 Device by Other route 3 (three) times daily. E11.42   OneTouch Ultra test strip Generic drug: glucose blood Check blood sugar three times daily E11.40   oxyCODONE-acetaminophen 5-325 MG tablet Commonly known as: Percocet Take 1 tablet by mouth every 6 (six) hours as needed for severe pain.   polyethylene glycol 17 g packet Commonly known as: MIRALAX / GLYCOLAX Take 17 g by mouth daily as needed for mild constipation.   SandoSTATIN LAR [place] 10 MG injection Generic drug: octreotide Inject 10 mg into the muscle every 28 days.   Tiger Balm Arthritis Rub 11-11 % Crea Generic drug: Menthol-Camphor Apply 1 Application topically daily as needed (pain).   timolol 0.5 % ophthalmic solution Commonly known as: TIMOPTIC Place 1 drop into both eyes in the morning.   vancomycin 1-5 GM/200ML-% Soln Commonly known as: VANCOCIN Inject 200 mLs (1,000 mg total) into the vein every Monday, Wednesday, and Friday with hemodialysis.               Durable Medical Equipment  (From admission, onward)           Start     Ordered   07/21/22 1156  For home use only DME Walker rolling  Once       Question  Answer Comment  Walker:  With 5 Inch Wheels   Patient needs a walker to treat with the following condition Balance problem      07/21/22 1156            Follow-up Information     Standiford, Nena Alexander, DPM. Schedule an appointment as soon as possible for a visit on 07/28/2022.   Specialty: Podiatry Contact information: [address] [place] [address] [phone]         [name], MD Follow up in 1 week(s).   Specialties: Family Medicine, Emergency Medicine Contact information: [place] [address] [phone]         [name], MD .   Specialty: Infectious Diseases Contact information: [address] [place] [address] [phone]         [name], MD .   Specialty: Cardiology Contact information: [id number]  [place] [address] [phone]                Allergies  Allergen Reactions   Augmentin [Amoxicillin-Pot Clavulanate] Diarrhea and Other (See Comments)    Severe diarrhea   Morphine Sulfate Anaphylaxis    Can't mix with oxycodone    Mucinex Fast-Max Other (See Comments)    Intense sweating    Amphetamines Other (See Comments)    Unknown reaction    Consultations: ID and podiatry   Procedures/Studies: VAS [place] ABI WITH/WO TBI  Result Date: 07/20/2022  [place] STUDY Patient Name:  AYLEN RAMBERT  Date of Exam:   07/20/2022 Medical Rec #: [id number]        Accession #:    [id number] Date of Birth: 1951/09/23         Patient Gender: M Patient Age:   71 years Exam Location:  [facility] Procedure:      VAS [place] ABI WITH/WO TBI Referring Phys: Sheppard Coil STANDIFORD --------------------------------------------------------------------------------  Indications: Ulceration. High Risk Factors: Hypertension, hyperlipidemia, Diabetes.  Comparison Study: No prior study Performing Technologist: Maudry Mayhew MHA, RVT, RDCS,  RDMS  Examination Guidelines: A complete evaluation includes at minimum, Doppler waveform signals and systolic blood pressure reading at the level of bilateral brachial, anterior tibial, and posterior tibial arteries, when vessel segments are accessible. Bilateral testing is considered an integral part of a complete examination. Photoelectric Plethysmograph (PPG) waveforms and toe systolic pressure readings are included as required and additional duplex testing as needed. Limited examinations for reoccurring indications may be performed as noted.  ABI Findings: +---------+------------------+-----+----------+---------+ Right    Rt Pressure (mmHg)IndexWaveform  Comment   +---------+------------------+-----+----------+---------+ Brachial                                  HD Access +---------+------------------+-----+----------+---------+ PTA      139               0.95 monophasic          +---------+------------------+-----+----------+---------+ DP       175               1.19 triphasic           +---------+------------------+-----+----------+---------+ Great Toe66                0.45                     +---------+------------------+-----+----------+---------+ +---------+------------------+-----+---------+---------------------------------+ Left     Lt Pressure (  mmHg)IndexWaveform Comment                           +---------+------------------+-----+---------+---------------------------------+ Brachial 147                    triphasic                                  +---------+------------------+-----+---------+---------------------------------+ PTA      163               1.11 triphasic                                  +---------+------------------+-----+---------+---------------------------------+ DP       169               1.15 triphasic                                  +---------+------------------+-----+---------+---------------------------------+ Great Toe                        Present  Unable to place cuff around great                                          toe secondary to toe deformity.   +---------+------------------+-----+---------+---------------------------------+ +-------+-----------+--------------------------------+------------+------------+ ABI/TBIToday's ABIToday's TBI                     Previous ABIPrevious TBI +-------+-----------+--------------------------------+------------+------------+ Right  1.19       1.15                                                     +-------+-----------+--------------------------------+------------+------------+ Left   0.45       Unable to obtain pressure- PPG                                             waveform present.                                        +-------+-----------+--------------------------------+------------+------------+  Summary: Right: Resting right ankle-brachial index is within normal range. The right toe-brachial index is abnormal. Left: Resting left ankle-brachial index is within normal range. *See table(s) above for measurements and observations.  Electronically signed by Harold Barban MD on 07/20/2022 at 9:05:52 PM.    Final    DG Foot 2 Views Right  Result Date: 07/20/2022 CLINICAL DATA:  Irrigation and debridement of right foot wound EXAM: RIGHT FOOT - 2 VIEW COMPARISON:  07/18/2022 FINDINGS: Interval resection of the distal portion of the right 4th metatarsal and proximal 4th phalanx. No additional acute bony abnormality. No subluxation or dislocation. Degenerative changes and hallux valgus deformity at the 1st MTP joint. IMPRESSION: Interval resection of the distal 4th metatarsal and proximal  aspect of the right 4th toe proximal phalanx. Electronically Signed   By: Rolm Baptise M.D.   On: 07/20/2022 20:32   DG Foot Complete Right  Result Date: 07/19/2022 Please see detailed radiograph report in office note.  MR FOOT RIGHT WO CONTRAST  Result Date:  07/18/2022 CLINICAL DATA:  Swelling of the foot. End-stage renal disease. Type 2 diabetes. Wound along the foot. EXAM: MRI OF THE RIGHT FOREFOOT WITHOUT CONTRAST TECHNIQUE: Multiplanar, multisequence MR imaging of the right forefoot was performed. No intravenous contrast was administered. COMPARISON:  Radiographs 07/18/2022 FINDINGS: Bones/Joint/Cartilage Abnormal edema signal in bony destructive findings in the distal metaphysis and distal head of the fourth metatarsal and in the proximal phalanx of the fourth digit, most compatible with osteomyelitis. There is some deformity along the distal metaphysis of the fourth metatarsal which could indicate a superimposed fracture, but the erosive appearance and involvement of the proximal phalanx strongly favors infection. Plantar overlying cutaneous wound noted, possibly draining from a small amount of fluid along the volar margin of the distal fourth metatarsal as on images 20 through 21 of series 5 and images 25-26 of series 8. No definite osteomyelitis elsewhere in the forefoot. There are degenerative findings at the first metatarsal head, with suspected medial erosion which can be seen in the setting of gout. Degenerative findings at the articulation with the first digit sesamoids also noted. Ligaments Lisfranc ligament intact. Muscles and Tendons Diffuse regional muscular edema, probably neurogenic. Soft tissues Diffuse circumferential subcutaneous edema in the foot, cellulitis not excluded. Small plantar wound below the distal fourth metatarsal, cannot exclude drainage site. IMPRESSION: 1. Osteomyelitis involving the fourth metatarsal distally as well as the proximal phalanx of the fourth digit. Possible plantar drainage from the distal fourth metatarsal given the overlying wound. 2. Diffuse subcutaneous edema in the forefoot, cellulitis not excluded. 3. Diffuse regional muscular edema, probably neurogenic. 4. Chronic appearing medial erosion along the first  metatarsal head, possibly from gout. Electronically Signed   By: Van Clines M.D.   On: 07/18/2022 19:02   DG Foot Complete Right  Result Date: 07/18/2022 CLINICAL DATA:  Diabetic wound, assess for osteomyelitis EXAM: RIGHT FOOT COMPLETE - 3 VIEW COMPARISON:  Right foot radiograph dated November 16, 2021 FINDINGS: New lucency and irregularity of the distal fourth metatarsal and base of the proximal right phalanx. Moderate degenerative changes of the midfoot and great right toe metatarsal joint. Vascular calcifications. Soft tissues are unremarkable. IMPRESSION: New lucency and irregularity of the distal fourth metatarsal and base of the proximal right phalanx, highly concerning for osteomyelitis. Could be further evaluated with MRI. Electronically Signed   By: Yetta Glassman M.D.   On: 07/18/2022 13:59   DG Chest 2 View  Result Date: 07/18/2022 CLINICAL DATA:  Shortness of breath EXAM: CHEST - 2 VIEW COMPARISON:  Chest radiograph June 28, 2022 FINDINGS: No pleural effusion. No pneumothorax. Postsurgical changes from median sternotomy and CABG. Aortic atherosclerotic calcifications. Interval removal of the left-sided tunneled central venous catheter. There is pulmonary venous congestion without evidence of overt pulmonary edema. Cardiomegaly. Chronic healed right-sided rib fractures. Degenerative changes of the right AC joint and glenohumeral joint. IMPRESSION: 1. Pulmonary venous congestion without evidence of overt pulmonary edema. 2.  No focal airspace opacity. Electronically Signed   By: Marin Roberts M.D.   On: 07/18/2022 13:46     Discharge Exam: Vitals:   07/22/22 1129 07/22/22 1200  BP: 125/64 (!) 118/56  Pulse: 75 75  Resp: 15 (!) 9  Temp:  SpO2: 100% 98%   Vitals:   07/21/22 2110 07/22/22 1112 07/22/22 1129 07/22/22 1200  BP: (!) 121/107 126/62 125/64 (!) 118/56  Pulse: 73 75 75 75  Resp: '18 13 15 '$ (!) 9  Temp: 98.1 F (36.7 C) 97.8 F (36.6 C)    TempSrc: Oral      SpO2: 100% 99% 100% 98%  Weight:  85.2 kg    Height:        General: Pt is alert, awake, not in acute distress Cardiovascular: RRR, S1/S2 +, no rubs, no gallops Respiratory: CTA bilaterally, no wheezing, no rhonchi Abdominal: Soft, NT, ND, bowel sounds + Extremities: no edema, no cyanosis, dressing in the right foot.    The results of significant diagnostics from this hospitalization (including imaging, microbiology, ancillary and laboratory) are listed below for reference.     Microbiology: Recent Results (from the past 240 hour(s))  Blood Cultures x 2 sites     Status: None (Preliminary result)   Collection Time: 07/18/22  8:00 PM   Specimen: BLOOD LEFT FOREARM  Result Value Ref Range Status   Specimen Description BLOOD LEFT FOREARM  Final   Special Requests   Final    BOTTLES DRAWN AEROBIC AND ANAEROBIC Blood Culture results may not be optimal due to an inadequate volume of blood received in culture bottles   Culture   Final    NO GROWTH 4 DAYS Performed at [facility], [place] [address] [place], [place] [address]    Report Status PENDING  Incomplete  Blood Cultures x 2 sites     Status: None (Preliminary result)   Collection Time: 07/18/22  8:10 PM   Specimen: BLOOD LEFT HAND  Result Value Ref Range Status   Specimen Description BLOOD LEFT HAND  Final   Special Requests   Final    BOTTLES DRAWN AEROBIC AND ANAEROBIC Blood Culture results may not be optimal due to an excessive volume of blood received in culture bottles   Culture   Final    NO GROWTH 4 DAYS Performed at [facility], [place] [address], [place], [place] [address]    Report Status PENDING  Incomplete  Surgical pcr screen     Status: Abnormal   Collection Time: 07/20/22 12:43 AM   Specimen: Nasal Mucosa; Nasal Swab  Result Value Ref Range Status   MRSA, PCR NEGATIVE NEGATIVE Final   Staphylococcus aureus POSITIVE (A) NEGATIVE Final    Comment: (NOTE) The Xpert SA Assay (FDA approved for  NASAL specimens in patients 27 years of age and older), is one component of a comprehensive surveillance program. It is not intended to diagnose infection nor to guide or monitor treatment. Performed at [facility], [place] [address], [place], [place] [address]   Aerobic/Anaerobic Culture w Gram Stain (surgical/deep wound)     Status: None (Preliminary result)   Collection Time: 07/20/22  6:38 PM   Specimen: Soft Tissue, Other  Result Value Ref Range Status   Specimen Description TISSUE  Final   Special Requests RT FORTH MPJ  Final   Gram Stain   Final    FEW WBC PRESENT, PREDOMINANTLY PMN NO ORGANISMS SEEN    Culture   Final    NO GROWTH 2 DAYS Performed at [facility], [place] [address], [place], [place] [address]    Report Status PENDING  Incomplete  Aerobic/Anaerobic Culture w Gram Stain (surgical/deep wound)     Status: None (Preliminary result)   Collection  Time: 07/20/22  6:49 PM   Specimen: Soft Tissue, Other  Result Value Ref Range Status   Specimen Description TISSUE  Final   Special Requests RT FOURTH METATARSAL  Final   Gram Stain   Final    FEW WBC PRESENT, PREDOMINANTLY PMN NO ORGANISMS SEEN    Culture   Final    CULTURE REINCUBATED FOR BETTER GROWTH Performed at [facility], [address], [place], [place] [address]    Report Status PENDING  Incomplete     Labs: BNP (last 3 results) No results for input(s): "BNP" in the last 8760 hours. Basic Metabolic Panel: Recent Labs  Lab 07/18/22 1240 07/18/22 1715 07/19/22 0336 07/20/22 0601 07/20/22 1703  NA 140  --  134* 136 134*  K 6.5*  --  4.7 4.6 4.4  CL 93*  --  94* 93* 95*  CO2 26  --  27 23  --   GLUCOSE 87  --  142* 145* 143*  BUN 144*  --  60* 77* 39*  CREATININE 15.45* 15.58* 8.25* 10.73* 7.10*  CALCIUM 8.6*  --  8.4* 8.8*  --   MG  --  2.7*  --  2.0  --   PHOS  --   --   --  6.9*  --    Liver Function Tests: Recent Labs  Lab 07/18/22 1240 07/19/22 0336 07/20/22 0601   AST 34 33  --   ALT 23 22  --   ALKPHOS 88 100  --   BILITOT 0.7 1.2  --   PROT 7.6 7.7  --   ALBUMIN 3.1* 3.1* 2.9*   Recent Labs  Lab 07/18/22 1240  LIPASE 31   No results for input(s): "AMMONIA" in the last 168 hours. CBC: Recent Labs  Lab 07/18/22 1240 07/18/22 1715 07/19/22 0336 07/20/22 0601 07/20/22 1703 07/21/22 0428  WBC 7.4 6.3 5.9 4.5  --  4.2  NEUTROABS 4.2  --   --   --   --   --   HGB 9.5* 10.3* 9.9* 8.6* 10.9* 8.3*  HCT 29.9* 31.0* 29.9* 26.0* 32.0* 24.5*  MCV 106.8* 104.4* 103.1* 104.8*  --  102.9*  PLT 195 197 171 149*  --  135*   Cardiac Enzymes: No results for input(s): "CKTOTAL", "CKMB", "CKMBINDEX", "TROPONINI" in the last 168 hours. BNP: Invalid input(s): "POCBNP" CBG: Recent Labs  Lab 07/21/22 0745 07/21/22 1144 07/21/22 1615 07/21/22 2112 07/22/22 0745  GLUCAP 138* 149* 151* 109* 139*   D-Dimer No results for input(s): "DDIMER" in the last 72 hours. Hgb A1c No results for input(s): "HGBA1C" in the last 72 hours. Lipid Profile No results for input(s): "CHOL", "HDL", "LDLCALC", "TRIG", "CHOLHDL", "LDLDIRECT" in the last 72 hours. Thyroid function studies No results for input(s): "TSH", "T4TOTAL", "T3FREE", "THYROIDAB" in the last 72 hours.  Invalid input(s): "FREET3" Anemia work up No results for input(s): "VITAMINB12", "FOLATE", "FERRITIN", "TIBC", "IRON", "RETICCTPCT" in the last 72 hours. Urinalysis    Component Value Date/Time   COLORURINE YELLOW 10/16/2020 2136   APPEARANCEUR CLEAR 10/16/2020 2136   LABSPEC 1.016 10/16/2020 2136   PHURINE 5.0 10/16/2020 2136   GLUCOSEU NEGATIVE 10/16/2020 2136   GLUCOSEU NEG mg/dL 09/20/2010 2058   HGBUR SMALL (A) 10/16/2020 2136   HGBUR negative 05/31/2010 0948   BILIRUBINUR NEGATIVE 10/16/2020 2136   [place] NEGATIVE 10/16/2020 2136   PROTEINUR 30 (A) 10/16/2020 2136   UROBILINOGEN 0.2 06/24/2015 2336   NITRITE NEGATIVE 10/16/2020 2136   LEUKOCYTESUR NEGATIVE  10/16/2020 2136    Sepsis Labs Recent Labs  Lab 07/18/22 1715 07/19/22 0336 07/20/22 0601 07/21/22 0428  WBC 6.3 5.9 4.5 4.2   Microbiology Recent Results (from the past 240 hour(s))  Blood Cultures x 2 sites     Status: None (Preliminary result)   Collection Time: 07/18/22  8:00 PM   Specimen: BLOOD LEFT FOREARM  Result Value Ref Range Status   Specimen Description BLOOD LEFT FOREARM  Final   Special Requests   Final    BOTTLES DRAWN AEROBIC AND ANAEROBIC Blood Culture results may not be optimal due to an inadequate volume of blood received in culture bottles   Culture   Final    NO GROWTH 4 DAYS Performed at [facility], [place] [address] [place], [place] [address]    Report Status PENDING  Incomplete  Blood Cultures x 2 sites     Status: None (Preliminary result)   Collection Time: 07/18/22  8:10 PM   Specimen: BLOOD LEFT HAND  Result Value Ref Range Status   Specimen Description BLOOD LEFT HAND  Final   Special Requests   Final    BOTTLES DRAWN AEROBIC AND ANAEROBIC Blood Culture results may not be optimal due to an excessive volume of blood received in culture bottles   Culture   Final    NO GROWTH 4 DAYS Performed at [facility], [place] [address] [place], [place] [address]    Report Status PENDING  Incomplete  Surgical pcr screen     Status: Abnormal   Collection Time: 07/20/22 12:43 AM   Specimen: Nasal Mucosa; Nasal Swab  Result Value Ref Range Status   MRSA, PCR NEGATIVE NEGATIVE Final   Staphylococcus aureus POSITIVE (A) NEGATIVE Final    Comment: (NOTE) The Xpert SA Assay (FDA approved for NASAL specimens in patients 88 years of age and older), is one component of a comprehensive surveillance program. It is not intended to diagnose infection nor to guide or monitor treatment. Performed at [facility], [place] [address], [place], [place] [address]   Aerobic/Anaerobic Culture w Gram Stain (surgical/deep wound)     Status: None (Preliminary result)    Collection Time: 07/20/22  6:38 PM   Specimen: Soft Tissue, Other  Result Value Ref Range Status   Specimen Description TISSUE  Final   Special Requests RT FORTH MPJ  Final   Gram Stain   Final    FEW WBC PRESENT, PREDOMINANTLY PMN NO ORGANISMS SEEN    Culture   Final    NO GROWTH 2 DAYS Performed at [facility], [place] [address] [place], [place] [address]    Report Status PENDING  Incomplete  Aerobic/Anaerobic Culture w Gram Stain (surgical/deep wound)     Status: None (Preliminary result)   Collection Time: 07/20/22  6:49 PM   Specimen: Soft Tissue, Other  Result Value Ref Range Status   Specimen Description TISSUE  Final   Special Requests RT FOURTH METATARSAL  Final   Gram Stain   Final    FEW WBC PRESENT, PREDOMINANTLY PMN NO ORGANISMS SEEN    Culture   Final    CULTURE REINCUBATED FOR BETTER GROWTH Performed at [facility], [place] [address] [place], [place] [address]    Report Status PENDING  Incomplete     Time coordinating discharge: Over 30 minutes  SIGNED:   Darliss Cheney, MD  [facility] 07/22/2022, 12:49 PM *Please note that this is a verbal dictation  therefore any spelling or grammatical errors are due to the "[place] [employer]" system interpretation. If 7PM-7AM, please contact night-coverage [email or web address]

## 2022-07-22 NOTE — TOC Transition Note (Addendum)
Transition of Care [facility]) - CM/SW Discharge Note   Patient Details  Name: Jacob [name] MRN: [id number] Date of Birth: Mar 10, 1951  Transition of Care [facility]) CM/SW Contact:  Tom-Johnson, Renea Ee, RN Phone Number: 07/22/2022, 2:46 PM   Clinical Narrative:     Patient is scheduled for discharge today. Patient will continue IV Vancomycin at outpatient dialysis till 08/31/22. Declines home health recommendations.  Outpatient hospital f/u info on AVS. RW ordered from [employer], to be delivered at bedside.  Cab voucher given to RN for transportation. No further TOC need noted.   Final next level of care: Home/Self Care Barriers to Discharge: Barriers Resolved   Patient Goals and CMS Choice Patient states their goals for this hospitalization and ongoing recovery are:: To return home CMS Medicare.gov Compare Post Acute Care list provided to:: Patient    Discharge Placement                Patient to be transferred to facility by: [facility]      Discharge Plan and Services   Discharge Planning Services: CM Consult            DME Arranged: N/A DME Agency: NA       HH Arranged: Patient Refused [place] Agency: NA        Social Determinants of Health (SDOH) Interventions     Readmission Risk Interventions     No data to display

## 2022-07-22 NOTE — Progress Notes (Addendum)
 PODIATRY PROGRESS NOTE Patient Name: Jacob [name]  DOB 11/05/1950 DOA 07/18/2022  Hospital Day: 5  Assessment:  71 y.o. male with PMHx significant for  ESRD on HD, HIV, type 2 diabetes mellitus with neuropathy, hypertension and several other comorbidities with Right foot infected ulceration sub met head 4 and underlying osteomyelitis of the 4th met head and base of 4th proximal phalanx.  POD 2 s/p R 4th met head resection, 4th proximal phalanx base resection, I&D of plantar foot wound. Found to have advanced necrosis of the 4th met head with pathologic fracture present. Proximal phalanx also soft and necrotic consistent with chronic OM  AF, VSS  WBC: 4.2  Wound/Bone Cultures: pending, NGTD Bone Path: Chronic OM 4th met head  Imaging:  XR Right foot 9/25: New lucency and irregularity of the distal fourth metatarsal and base of the proximal right phalanx, highly concerning for osteomyelitis.   MRI R foot 9/25 WO contrast: 1. Osteomyelitis involving the fourth metatarsal distally as well as the proximal phalanx of the fourth digit. Possible plantar drainage from the distal fourth metatarsal given the overlying wound.  Plan:  - POD 2 s/p R  4th met head resection, 4th proximal phalanx base resection, I&D of plantar foot wound.  - abx per ID, appreciate recs continuing vanc at HD thru Nov - Wound care: Dsg changed today, will need ongoing wound care at home, recommend betadine wet to dry dressing changes for dorsal and plantar wound MWF. Patient refused home healthcare RN for dsg as he does not want people coming to his house. Will need to do dressing changes himself and follow up at wound care center or my office for ongoing wound care - WB status: WBAT to RLE in post op shoe - Stable for discharge per podiatry  - Follow up in office next thursday        Alex F , DPM [facility]    Subjective:  Patient seen in HD, wants to go home, aware of plans for follow  up and wound care. Stressed importance of following up for ongoing wound care.   Objective:   Vitals:   07/22/22 1330 07/22/22 1400  BP: (!) 106/93 123/73  Pulse: 83 81  Resp: 15 11  Temp:    SpO2:         Latest Ref Rng & Units 07/21/2022    4:28 AM 07/20/2022    5:03 PM 07/20/2022    6:01 AM  CBC  WBC 4.0 - 10.5 K/uL 4.2   4.5   Hemoglobin 13.0 - 17.0 g/dL 8.3  10.9  8.6   Hematocrit 39.0 - 52.0 % 24.5  32.0  26.0   Platelets 150 - 400 K/uL 135   149        Latest Ref Rng & Units 07/20/2022    5:03 PM 07/20/2022    6:01 AM 07/19/2022    3:36 AM  BMP  Glucose 70 - 99 mg/dL 143  145  142   BUN 8 - 23 mg/dL 39  77  60   Creatinine 0.61 - 1.24 mg/dL 7.10  10.73  8.25   Sodium 135 - 145 mmol/L 134  136  134   Potassium 3.5 - 5.1 mmol/L 4.4  4.6  4.7   Chloride 98 - 111 mmol/L 95  93  94   CO2 22 - 32 mmol/L  23  27   Calcium 8.9 - 10.3 mg/dL  8.8  8.4       General: AAOx3, NAD  Lower Extremity Exam Physical Exam: Lower Extremity Exam Vasc:     R - PT weakly palpable, DP 2+ palpable. Cap refill < 3 sec to digits               L - PT palpable, DP palpable. Cap refill <3 sec to digits   Derm:    R - Dressing changed, mild maceration of dorsal foot incision, sanguinous drainage from plantar foot wound                      L - Normal temp/texture/turgor with no open lesion or clinical signs of infection   MSK:     R - Edema of the right foot, minimally tender to palpation about the wound               L -  No gross deformities. Compartments soft, non-tender, compressible   Neuro:   R - Gross sensation diminished. Gross motor function intact                 L - Gross sensation diminished. Gross motor function intact     Radiology:  Results reviewed. See assessment for pertinent imaging results

## 2022-07-22 NOTE — Progress Notes (Signed)
Pt to d/c today. Contacted attending and nephrology this morning to inquire if pt would be appropriate for out-pt HD later today or tomorrow if pt to d/c today. At that time, attending felt it in pt's best interest to receive HD inpt due to cultures pending and ID final recs pending. Contacted [facility] to advise clinic that pt will d/c today and resume care on Monday. Clinic advised that [facility] staff to send orders for iv abx with HD.   Melven Sartorius Renal Navigator [phone]

## 2022-07-22 NOTE — Telephone Encounter (Signed)
Pt discharged from [facility]...called about a Rx that was prescribed but not called/sent in. [id number]? looks like it was printed off...RNCM returned call to him, no answer.  Handed off to [place] to follow up in AM.

## 2022-07-22 NOTE — Progress Notes (Signed)
Mobility Specialist Progress Note:   07/22/22 0930  Mobility  Activity Ambulated with assistance in room  Level of Assistance Contact guard assist, steadying assist  Assistive Device Front wheel walker  RLE Weight Bearing WBAT  Distance Ambulated (ft) 10 ft  Activity Response Tolerated well  $Mobility charge 1 Mobility   Session limited d/t pt going to HD per pt. Required no physical assistance to stand ambulate short distance in room. Pt left sitting EOB with all needs met.   Nelta Numbers Acute Rehab Secure Chat or Office Phone: [phone]

## 2022-07-22 NOTE — Progress Notes (Signed)
Physical Therapy Treatment Patient Details Name: Jacob [name] MRN: [id number] DOB: 1951/03/14 Today's Date: 07/22/2022   History of Present Illness Pt is a 71 y/o male who admitted for osteomyelitis of R foot. Pt underwent 4th toe resection and I&D of plantar ulceration of R foot on 9/27. PMH: ESRD on HD, HIV, DM2 with neuropathy, HTN, and chronic diabetic ulcer of R foot.    PT Comments    Pt sitting EoB on entry, voicing frustration with food, discharge plan and lack of communication. With encouragement pt agreeable to working on [employer]. Pt with improved proximity to RW, upright posture and management of post op shoe. RN met pt in hallway and relayed pt to go to OP dialysis center. Pt upset and returns to room, despite frustration does continue to follow PT cues for safety. D/c plans remain appropriate.    Recommendations for follow up therapy are one component of a multi-disciplinary discharge planning process, led by the attending physician.  Recommendations may be updated based on patient status, additional functional criteria and insurance authorization.  Follow Up Recommendations  No PT follow up     Assistance Recommended at Discharge PRN  Patient can return home with the following Assistance with cooking/housework;Assist for transportation   Equipment Recommendations  Rolling walker (2 wheels)       Precautions / Restrictions Precautions Precautions: Fall Required Braces or Orthoses: Other Brace Other Brace: post op shoe R foot Restrictions Weight Bearing Restrictions: Yes RLE Weight Bearing: Weight bearing as tolerated Other Position/Activity Restrictions: R post op shoe     Mobility  Bed Mobility Overal bed mobility: Modified Independent                  Transfers Overall transfer level: Needs assistance Equipment used: Rolling walker (2 wheels) (knee scooter) Transfers: Sit to/from Stand Sit to Stand: Supervision           General transfer  comment: good power up and self steadying in RW    Ambulation/Gait Ambulation/Gait assistance: Min guard Gait Distance (Feet): 150 Feet Assistive device: Rolling walker (2 wheels) Gait Pattern/deviations: Trunk flexed, Decreased dorsiflexion - right, Knees buckling, Step-through pattern Gait velocity: WFL Gait velocity interpretation: 1.31 - 2.62 ft/sec, indicative of limited community ambulator   General Gait Details: min guard for safety with ambulation in RW, improved proximity to RW, and increased L foot clearance with post-op shoe       Balance Overall balance assessment: Needs assistance Sitting-balance support: No upper extremity supported, Feet supported Sitting balance-Leahy Scale: Good     Standing balance support: During functional activity Standing balance-Leahy Scale: Fair Standing balance comment: able to static stand, improved safety with UE support in dynamic activities                            Cognition Arousal/Alertness: Awake/alert Behavior During Therapy: WFL for tasks assessed/performed, Impulsive, Restless Overall Cognitive Status: No family/caregiver present to determine baseline cognitive functioning                                 General Comments: initially frustrated by lack of info from doctors, DC plan, etc. After time spent educated on MD names, who will see him today and goal of DC home - pt amenable to participate. follows directions though a bit impulsive at times. declines need for RW use though unsteady with cane  General Comments General comments (skin integrity, edema, etc.): Session shortened due to pt frustration with discharge plans to transport him to his OP dialysis facility      Pertinent Vitals/Pain  No denies pain      PT Goals (current goals can now be found in the care plan section) Acute Rehab PT Goals Patient Stated Goal: not be seen as "crippled" PT Goal Formulation: With  patient Time For Goal Achievement: 08/03/22 Potential to Achieve Goals: Good Progress towards PT goals: Progressing toward goals    Frequency    Min 3X/week      PT Plan Current plan remains appropriate       AM-PAC PT "6 Clicks" Mobility   Outcome Measure  Help needed turning from your back to your side while in a flat bed without using bedrails?: None Help needed moving from lying on your back to sitting on the side of a flat bed without using bedrails?: None Help needed moving to and from a bed to a chair (including a wheelchair)?: None Help needed standing up from a chair using your arms (e.g., wheelchair or bedside chair)?: None Help needed to walk in hospital room?: A Little Help needed climbing 3-5 steps with a railing? : A Lot 6 Click Score: 21    End of Session Equipment Utilized During Treatment: Gait belt Activity Tolerance: Patient tolerated treatment well Patient left: in bed;with call bell/phone within reach Nurse Communication: Mobility status PT Visit Diagnosis: Muscle weakness (generalized) (M62.81);Other abnormalities of gait and mobility (R26.89);Unsteadiness on feet (R26.81);Difficulty in walking, not elsewhere classified (R26.2)     Time: 2419-9144 PT Time Calculation (min) (ACUTE ONLY): 14 min  Charges:  $Gait Training: 8-22 mins                     Raciel Caffrey B. [name] PT, DPT Acute Rehabilitation Services Please use secure chat or  Call Office [phone]    [place] 07/22/2022, 9:59 AM

## 2022-07-22 NOTE — Progress Notes (Signed)
1. No-show for appointment    Patient hospitalized. No charge.

## 2022-07-22 NOTE — Progress Notes (Signed)
Outpatient Antibiotic Plan   Indication: 4th right toe osteomyelitis Regimen: Vancomycin 1000 mg every MWF with dialysis and Ceftazidime 2 gm every MWF with dialysis  End date: 08/31/22   No formal OPAT will be done as patient will receive antibiotics center. Nephrology team has been alerted of plan.    Thank you for allowing pharmacy to be a part of this patient's care.  Jimmy Footman, PharmD, BCPS, BCIDP Infectious Diseases Clinical Pharmacist Phone: [phone] 07/22/2022, 12:59 PM

## 2022-07-22 NOTE — Progress Notes (Signed)
[facility] Progress Note   Subjective:  Seen in room - feels well, but frustrated and wanting to go home today after dialysis. Appears that we are waiting on final abx recs from ID?  Objective Vitals:   07/21/22 0607 07/21/22 0955 07/21/22 1635 07/21/22 2110  BP: 111/61 109/67 116/63 (!) 121/107  Pulse: 69 65 78 73  Resp: '17 18 18 18  '$ Temp: 98 F (36.7 C) 97.6 F (36.4 C) (!) 97.3 F (36.3 C) 98.1 F (36.7 C)  TempSrc: Oral Oral Oral Oral  SpO2:   96% 100%  Weight:      Height:       Physical Exam General: Well appearing man, NAD. Room air. Heart: RRR; no murmur Lungs: CTAB; no rales or wheezing Abdomen: soft Extremities: No LE edema; ACE wrap to R foot Dialysis Access: R AVG + bruit  Additional Objective Labs: Basic Metabolic Panel: Recent Labs  Lab 07/18/22 1240 07/18/22 1715 07/19/22 0336 07/20/22 0601 07/20/22 1703  NA 140  --  134* 136 134*  K 6.5*  --  4.7 4.6 4.4  CL 93*  --  94* 93* 95*  CO2 26  --  27 23  --   GLUCOSE 87  --  142* 145* 143*  BUN 144*  --  60* 77* 39*  CREATININE 15.45*   < > 8.25* 10.73* 7.10*  CALCIUM 8.6*  --  8.4* 8.8*  --   PHOS  --   --   --  6.9*  --    < > = values in this interval not displayed.   Liver Function Tests: Recent Labs  Lab 07/18/22 1240 07/19/22 0336 07/20/22 0601  AST 34 33  --   ALT 23 22  --   ALKPHOS 88 100  --   BILITOT 0.7 1.2  --   PROT 7.6 7.7  --   ALBUMIN 3.1* 3.1* 2.9*   Recent Labs  Lab 07/18/22 1240  LIPASE 31   CBC: Recent Labs  Lab 07/18/22 1240 07/18/22 1715 07/19/22 0336 07/20/22 0601 07/20/22 1703 07/21/22 0428  WBC 7.4 6.3 5.9 4.5  --  4.2  NEUTROABS 4.2  --   --   --   --   --   HGB 9.5* 10.3* 9.9* 8.6* 10.9* 8.3*  HCT 29.9* 31.0* 29.9* 26.0* 32.0* 24.5*  MCV 106.8* 104.4* 103.1* 104.8*  --  102.9*  PLT 195 197 171 149*  --  135*   Blood Culture    Component Value Date/Time   SDES TISSUE 07/20/2022 1849   SPECREQUEST RT FOURTH METATARSAL  07/20/2022 1849   CULT  07/20/2022 1849    NO GROWTH < 12 HOURS Performed at [facility], [place] [address] [place], [place] [address]    REPTSTATUS PENDING 07/20/2022 1849   Studies/Results: VAS [place] ABI WITH/WO TBI  Result Date: 07/20/2022  LOWER EXTREMITY DOPPLER STUDY Patient Name:  Jacob [name]  Date of Exam:   07/20/2022 Medical Rec #: [id number]        Accession #:    [id number] Date of Birth: 07/08/1951         Patient Gender: M Patient Age:   71 years Exam Location:  [facility] Procedure:      VAS [place] ABI WITH/WO TBI Referring Phys: Sheppard Coil STANDIFORD --------------------------------------------------------------------------------  Indications: Ulceration. High Risk Factors: Hypertension, hyperlipidemia, Diabetes.  Comparison Study: No prior study Performing Technologist: Maudry Mayhew MHA, RVT, RDCS, RDMS  Examination Guidelines: A complete evaluation  includes at minimum, Doppler waveform signals and systolic blood pressure reading at the level of bilateral brachial, anterior tibial, and posterior tibial arteries, when vessel segments are accessible. Bilateral testing is considered an integral part of a complete examination. Photoelectric Plethysmograph (PPG) waveforms and toe systolic pressure readings are included as required and additional duplex testing as needed. Limited examinations for reoccurring indications may be performed as noted.  ABI Findings: +---------+------------------+-----+----------+---------+ Right    Rt Pressure (mmHg)IndexWaveform  Comment   +---------+------------------+-----+----------+---------+ Brachial                                  HD Access +---------+------------------+-----+----------+---------+ PTA      139               0.95 monophasic          +---------+------------------+-----+----------+---------+ DP       175               1.19 triphasic           +---------+------------------+-----+----------+---------+ Great  Toe66                0.45                     +---------+------------------+-----+----------+---------+ +---------+------------------+-----+---------+---------------------------------+ Left     Lt Pressure (mmHg)IndexWaveform Comment                           +---------+------------------+-----+---------+---------------------------------+ Brachial 147                    triphasic                                  +---------+------------------+-----+---------+---------------------------------+ PTA      163               1.11 triphasic                                  +---------+------------------+-----+---------+---------------------------------+ DP       169               1.15 triphasic                                  +---------+------------------+-----+---------+---------------------------------+ Great Toe                       Present  Unable to place cuff around great                                          toe secondary to toe deformity.   +---------+------------------+-----+---------+---------------------------------+ +-------+-----------+--------------------------------+------------+------------+ ABI/TBIToday's ABIToday's TBI                     Previous ABIPrevious TBI +-------+-----------+--------------------------------+------------+------------+ Right  1.19       1.15                                                     +-------+-----------+--------------------------------+------------+------------+  Left   0.45       Unable to obtain pressure- PPG                                             waveform present.                                        +-------+-----------+--------------------------------+------------+------------+  Summary: Right: Resting right ankle-brachial index is within normal range. The right toe-brachial index is abnormal. Left: Resting left ankle-brachial index is within normal range. *See table(s) above for measurements  and observations.  Electronically signed by Harold Barban MD on 07/20/2022 at 9:05:52 PM.    Final    DG Foot 2 Views Right  Result Date: 07/20/2022 CLINICAL DATA:  Irrigation and debridement of right foot wound EXAM: RIGHT FOOT - 2 VIEW COMPARISON:  07/18/2022 FINDINGS: Interval resection of the distal portion of the right 4th metatarsal and proximal 4th phalanx. No additional acute bony abnormality. No subluxation or dislocation. Degenerative changes and hallux valgus deformity at the 1st MTP joint. IMPRESSION: Interval resection of the distal 4th metatarsal and proximal aspect of the right 4th toe proximal phalanx. Electronically Signed   By: Rolm Baptise M.D.   On: 07/20/2022 20:32   Medications:  ceFEPime (MAXIPIME) IV     vancomycin Stopped (07/20/22 1245)    atorvastatin  10 mg Oral Daily   bictegravir-emtricitabine-tenofovir AF  1 tablet Oral Daily   calcitRIOL  1 mcg Oral Q M,W,F   calcium acetate  2,001 mg Oral TID WC   carvedilol  3.125 mg Oral BID WC   Chlorhexidine Gluconate Cloth  6 each Topical Q0600   cinacalcet  30 mg Oral Q M,W,F-HD   darbepoetin (ARANESP) injection - DIALYSIS  200 mcg Intravenous Q Fri-HD   folic acid  1 mg Oral Daily   gabapentin  300 mg Oral BID   insulin aspart  0-15 Units Subcutaneous TID WC   insulin aspart  0-5 Units Subcutaneous QHS   isosorbide mononitrate  30 mg Oral Daily   latanoprost  1 drop Both Eyes QHS   metroNIDAZOLE  500 mg Oral Q12H   mupirocin ointment  1 Application Nasal BID   pantoprazole (PROTONIX) IV  40 mg Intravenous Q12H   sodium chloride flush  3 mL Intravenous Q12H   timolol  1 drop Both Eyes q AM    Dialysis Orders: [facility] MWF 3 hr 45 min 180NRe 400/Autoflow 1.5 82 kg 2.0 K/2.0 Ca AVG No heparin -Mircera 75 mcg IV q 2 weeks (last dose 07/06/2022) -Calcitriol 1.0 mcg PO TIW -Sensipar 30 mg PO TIW  Assessment/Plan: Volume overload in setting of missed HD: Improved with HD, now below prior EDW - will change on discharge.   Hyperkalemia: Resolved with HD. R foot/toe osteomyelitis: S/p 4th metatarsal head, proximal phalanx partial resection 07/20/2022. Looks like surgical Cx are negative. ID involved, awaiting final abx recommendations. ?GIB/Anemia of ESRD: Hgb down to 8.3, FOBT +. Giving Aranesp today with HD. Follow.  ESRD: Continue HD on usual MWF schedule - HD today. Hypertension/volume: As above, volume improved. Will lower dry weight on discharge.  Metabolic bone disease -  OP PO4 at goal, slightly elevated here. Continue binders, VDRA & Sensipar.  Nutrition - Albumin low. Changed to renal/carb  mod diet with protein supplements, nepro  DMT2-per primary  HIV-meds per primary  Veneta Penton, PA-C 07/22/2022, 10:30 AM  [facility]

## 2022-07-22 NOTE — Progress Notes (Signed)
[facility] for Infectious Disease  Date of Admission:  07/18/2022     Abx: 9/28-c ceftazidime 9/25-c vanc  9/25-28 metronidazole 9/25-28 cefepime                                                                  Assessment: 71 yo male with hiv controlled on biktarvy, esrd, dm2, admitted for diabetic foot om   #4th toe right side om Mri showed right foot 4th mt/phalanx om, s/p I&D 9/27 without partial ray amputation. Cx in progress Will need to treat a 4-6 week course for osteomyelitis 9/29 it appears that he wants to leave the hospital; the cx is growing something being reincubated If he is adamant on leaving today could send on 6 weeks vanc/ceftaz from 9/28-11/09, to be given at his dialysis center He has [facility] f/u with Dr [name] on 10/12 @ 9am    #hiv Recent Labs       Lab Results  Component Value Date    HIV1RNAQUANT NOT DETECTED 02/24/2022      Recent Labs       Lab Results  Component Value Date    CD4TCELL 30 (L) 02/24/2022    CD4TABS 315 (L) 02/24/2022            Plan: Continue vanc tiw with dialysis D/c metronidazole/cefepime; start ceftaz tiw with dialysis [facility] team will f/u culture and adjust abx as needed Continue biktarvy for hiv Discussed with primary team    [facility] appointment/[facility]AT labs Iv abx to be given post dialysis tiw Vanc ceftaz  Needs weekly vanc predialysis level and cbc, cmp, crp  Fax weekly labs to [phone]  Clinic Follow Up Appt: Dr [name] 10/12 @ 9am  @  [facility] [id number], [place], [place] [address] Phone: [phone]   Principal Problem:   Osteomyelitis ([place]) Active Problems:   Human immunodeficiency virus (HIV) disease ([place])   Essential hypertension   Coronary artery disease involving native heart without angina pectoris   Hyperkalemia   HTN (hypertension)   Anemia of chronic disease   Hyperlipidemia associated with type 2 diabetes mellitus ([place])   Diabetic foot  ulcer ([place])   ESRD on hemodialysis ([place])   Allergies  Allergen Reactions   Augmentin [Amoxicillin-Pot Clavulanate] Diarrhea and Other (See Comments)    Severe diarrhea   Morphine Sulfate Anaphylaxis    Can't mix with oxycodone    Mucinex Fast-Max Other (See Comments)    Intense sweating    Amphetamines Other (See Comments)    Unknown reaction    Scheduled Meds:  atorvastatin  10 mg Oral Daily   bictegravir-emtricitabine-tenofovir AF  1 tablet Oral Daily   calcitRIOL  1 mcg Oral Q M,W,F   calcium acetate  2,001 mg Oral TID WC   carvedilol  3.125 mg Oral BID WC   Chlorhexidine Gluconate Cloth  6 each Topical Q0600   cinacalcet  30 mg Oral Q M,W,F-HD   darbepoetin (ARANESP) injection - DIALYSIS  200 mcg Intravenous Q Fri-HD   folic acid  1 mg Oral Daily   gabapentin  300 mg Oral BID   insulin aspart  0-15 Units Subcutaneous TID WC   insulin aspart  0-5 Units Subcutaneous  QHS   isosorbide mononitrate  30 mg Oral Daily   latanoprost  1 drop Both Eyes QHS   mupirocin ointment  1 Application Nasal BID   pantoprazole (PROTONIX) IV  40 mg Intravenous Q12H   sodium chloride flush  3 mL Intravenous Q12H   timolol  1 drop Both Eyes q AM   Continuous Infusions:  cefTAZidime (FORTAZ)  IV     vancomycin Stopped (07/20/22 1245)   PRN Meds:.docusate sodium, ondansetron **OR** ondansetron (ZOFRAN) IV, oxyCODONE-acetaminophen, polyethylene glycol   SUBJECTIVE: Patient wants to go home No complaint Cx growing something; reincubated  Review of Systems: ROS All other ROS was negative, except mentioned above     OBJECTIVE: Vitals:   07/21/22 2110 07/22/22 1112 07/22/22 1129 07/22/22 1200  BP: (!) 121/107 126/62 125/64 (!) 118/56  Pulse: 73 75 75 75  Resp: '18 13 15 '$ (!) 9  Temp: 98.1 F (36.7 C) 97.8 F (36.6 C)    TempSrc: Oral     SpO2: 100% 99% 100% 98%  Weight:  85.2 kg    Height:       Body mass index is 27.74 kg/m.  Physical Exam  General: no distress;  conversant MSK: right foot dressing c/d Pulm: normal respiratory effort Neuro: nonfocal Skin: no rash  Lab Results Lab Results  Component Value Date   WBC 4.2 07/21/2022   HGB 8.3 (L) 07/21/2022   HCT 24.5 (L) 07/21/2022   MCV 102.9 (H) 07/21/2022   PLT 135 (L) 07/21/2022    Lab Results  Component Value Date   CREATININE 7.10 (H) 07/20/2022   BUN 39 (H) 07/20/2022   NA 134 (L) 07/20/2022   K 4.4 07/20/2022   CL 95 (L) 07/20/2022   CO2 23 07/20/2022    Lab Results  Component Value Date   ALT 22 07/19/2022   AST 33 07/19/2022   ALKPHOS 100 07/19/2022   BILITOT 1.2 07/19/2022      Microbiology: Recent Results (from the past 240 hour(s))  Blood Cultures x 2 sites     Status: None (Preliminary result)   Collection Time: 07/18/22  8:00 PM   Specimen: BLOOD LEFT FOREARM  Result Value Ref Range Status   Specimen Description BLOOD LEFT FOREARM  Final   Special Requests   Final    BOTTLES DRAWN AEROBIC AND ANAEROBIC Blood Culture results may not be optimal due to an inadequate volume of blood received in culture bottles   Culture   Final    NO GROWTH 4 DAYS Performed at [facility], [place] [address], [place], [place] [address]    Report Status PENDING  Incomplete  Blood Cultures x 2 sites     Status: None (Preliminary result)   Collection Time: 07/18/22  8:10 PM   Specimen: BLOOD LEFT HAND  Result Value Ref Range Status   Specimen Description BLOOD LEFT HAND  Final   Special Requests   Final    BOTTLES DRAWN AEROBIC AND ANAEROBIC Blood Culture results may not be optimal due to an excessive volume of blood received in culture bottles   Culture   Final    NO GROWTH 4 DAYS Performed at [facility], [place] [address] [place], [place] [address]    Report Status PENDING  Incomplete  Surgical pcr screen     Status: Abnormal   Collection Time: 07/20/22 12:43 AM   Specimen: Nasal Mucosa; Nasal Swab  Result Value Ref Range Status   MRSA, PCR NEGATIVE NEGATIVE  Final   Staphylococcus aureus POSITIVE (A) NEGATIVE Final    Comment: (NOTE) The Xpert SA Assay (FDA approved for NASAL specimens in patients 47 years of age and older), is one component of a comprehensive surveillance program. It is not intended to diagnose infection nor to guide or monitor treatment. Performed at [facility], [place] [address], [place], [place] [address]   Aerobic/Anaerobic Culture w Gram Stain (surgical/deep wound)     Status: None (Preliminary result)   Collection Time: 07/20/22  6:38 PM   Specimen: Soft Tissue, Other  Result Value Ref Range Status   Specimen Description TISSUE  Final   Special Requests RT FORTH MPJ  Final   Gram Stain   Final    FEW WBC PRESENT, PREDOMINANTLY PMN NO ORGANISMS SEEN    Culture   Final    NO GROWTH 2 DAYS Performed at [facility], [place] [address], [place], [place] [address]    Report Status PENDING  Incomplete  Aerobic/Anaerobic Culture w Gram Stain (surgical/deep wound)     Status: None (Preliminary result)   Collection Time: 07/20/22  6:49 PM   Specimen: Soft Tissue, Other  Result Value Ref Range Status   Specimen Description TISSUE  Final   Special Requests RT FOURTH METATARSAL  Final   Gram Stain   Final    FEW WBC PRESENT, PREDOMINANTLY PMN NO ORGANISMS SEEN    Culture   Final    CULTURE REINCUBATED FOR BETTER GROWTH Performed at [facility], [place] [address] [place], [place] [address]    Report Status PENDING  Incomplete     Serology:   Imaging: If present, new imagings (plain films, ct scans, and mri) have been personally visualized and interpreted; radiology reports have been reviewed. Decision making incorporated into the Impression / Recommendations.  9/25 right foot mri 9/25 mri right foot 1. Osteomyelitis involving the fourth metatarsal distally as well as the proximal phalanx of the fourth digit. Possible plantar drainage from the distal fourth metatarsal given the overlying  wound. 2. Diffuse subcutaneous edema in the forefoot, cellulitis not excluded. 3. Diffuse regional muscular edema, probably neurogenic. 4. Chronic appearing medial erosion along the first metatarsal head, possibly from gout.  Jabier Mutton, [facility] [phone] pager    07/22/2022, 12:28 PM

## 2022-07-22 NOTE — Plan of Care (Signed)

## 2022-07-23 ENCOUNTER — Telehealth (HOSPITAL_COMMUNITY): Payer: Self-pay | Admitting: Nephrology

## 2022-07-23 ENCOUNTER — Other Ambulatory Visit: Payer: Self-pay | Admitting: Family

## 2022-07-23 ENCOUNTER — Telehealth: Payer: Self-pay

## 2022-07-23 DIAGNOSIS — R112 Nausea with vomiting, unspecified: Secondary | ICD-10-CM

## 2022-07-23 DIAGNOSIS — I129 Hypertensive chronic kidney disease with stage 1 through stage 4 chronic kidney disease, or unspecified chronic kidney disease: Secondary | ICD-10-CM | POA: Diagnosis not present

## 2022-07-23 DIAGNOSIS — Z992 Dependence on renal dialysis: Secondary | ICD-10-CM | POA: Diagnosis not present

## 2022-07-23 DIAGNOSIS — N186 End stage renal disease: Secondary | ICD-10-CM | POA: Diagnosis not present

## 2022-07-23 LAB — CULTURE, BLOOD (ROUTINE X 2)
Culture: NO GROWTH
Culture: NO GROWTH

## 2022-07-23 NOTE — Telephone Encounter (Signed)
Transition of care contact from inpatient facility  Date of Discharge: 07/22/22 Date of Contact: 07/23/22 - attempt #1 Method of contact: Phone  Attempted to contact patient to discuss transition of care from inpatient admission. Patient did not answer the phone. There was no ability to leave a voicemail message.  Veneta Penton, PA-C [employer] Pager [phone]

## 2022-07-23 NOTE — Telephone Encounter (Signed)
Patient called in last night worried about IV medications. IV medications were set up by Renal navigator. Called patient. He understands that IV medications will be at dialysis, that he does not have to pick them up. He is expressing worry about his leg wraps, he states he is supposed to put medicine in his foot. He does have dialysis on Monday and wound care appt on Tuesday. He felt better discussing and will just keep the wraps clean and dry until then. Return precautions discussed. He should call his PCP for issues, and home health. He states he did not get bathed the whole time he was in the hospital and people like to "pass the buck"

## 2022-07-25 ENCOUNTER — Other Ambulatory Visit: Payer: Self-pay

## 2022-07-25 ENCOUNTER — Telehealth: Payer: Self-pay | Admitting: Podiatry

## 2022-07-25 ENCOUNTER — Encounter (HOSPITAL_COMMUNITY): Payer: Self-pay

## 2022-07-25 DIAGNOSIS — N2581 Secondary hyperparathyroidism of renal origin: Secondary | ICD-10-CM | POA: Diagnosis not present

## 2022-07-25 DIAGNOSIS — D649 Anemia, unspecified: Secondary | ICD-10-CM | POA: Diagnosis not present

## 2022-07-25 DIAGNOSIS — E1122 Type 2 diabetes mellitus with diabetic chronic kidney disease: Secondary | ICD-10-CM | POA: Diagnosis not present

## 2022-07-25 DIAGNOSIS — M869 Osteomyelitis, unspecified: Secondary | ICD-10-CM | POA: Diagnosis not present

## 2022-07-25 DIAGNOSIS — D631 Anemia in chronic kidney disease: Secondary | ICD-10-CM | POA: Diagnosis not present

## 2022-07-25 DIAGNOSIS — N186 End stage renal disease: Secondary | ICD-10-CM | POA: Diagnosis not present

## 2022-07-25 DIAGNOSIS — M868X8 Other osteomyelitis, other site: Secondary | ICD-10-CM | POA: Diagnosis not present

## 2022-07-25 DIAGNOSIS — E8770 Fluid overload, unspecified: Secondary | ICD-10-CM | POA: Diagnosis not present

## 2022-07-25 DIAGNOSIS — Z992 Dependence on renal dialysis: Secondary | ICD-10-CM | POA: Diagnosis not present

## 2022-07-25 LAB — AEROBIC/ANAEROBIC CULTURE W GRAM STAIN (SURGICAL/DEEP WOUND): Culture: NO GROWTH

## 2022-07-25 NOTE — Telephone Encounter (Signed)
Patient called and wanted to request pain meds. He is Currently taking Oxy from his pcp and he has one left and wanted to know if Dr. Loel Lofty would prescibe it for him  Patient would like to know if he needs to follow up with wound care tomorrow. Patient is scheduled at 8;15

## 2022-07-25 NOTE — Telephone Encounter (Signed)
Patient called requesting refill on Oxycodone.  Medication pended and sent to Dr. Alain Honey for approvalThis encounter was created in error - please disregard.

## 2022-07-25 NOTE — Telephone Encounter (Signed)
Pt called back about medication. I gave him the instructions written from Dr. Loel Lofty. He states that "these doctors dont know what they are doing and if he should loose his foot everyone is going to be sued".   I again read the recommendations of following up with PCP for any additional narcotics and to follow up with wound care tomorrow morning.  Call was ended.  Please advise.

## 2022-07-26 ENCOUNTER — Other Ambulatory Visit: Payer: Self-pay | Admitting: *Deleted

## 2022-07-26 ENCOUNTER — Telehealth: Payer: Self-pay

## 2022-07-26 ENCOUNTER — Encounter (HOSPITAL_BASED_OUTPATIENT_CLINIC_OR_DEPARTMENT_OTHER): Payer: Medicare Other | Attending: General Surgery | Admitting: General Surgery

## 2022-07-26 DIAGNOSIS — L97512 Non-pressure chronic ulcer of other part of right foot with fat layer exposed: Secondary | ICD-10-CM | POA: Insufficient documentation

## 2022-07-26 DIAGNOSIS — M1990 Unspecified osteoarthritis, unspecified site: Secondary | ICD-10-CM | POA: Diagnosis not present

## 2022-07-26 DIAGNOSIS — N2581 Secondary hyperparathyroidism of renal origin: Secondary | ICD-10-CM | POA: Insufficient documentation

## 2022-07-26 DIAGNOSIS — I5042 Chronic combined systolic (congestive) and diastolic (congestive) heart failure: Secondary | ICD-10-CM | POA: Diagnosis not present

## 2022-07-26 DIAGNOSIS — I132 Hypertensive heart and chronic kidney disease with heart failure and with stage 5 chronic kidney disease, or end stage renal disease: Secondary | ICD-10-CM | POA: Insufficient documentation

## 2022-07-26 DIAGNOSIS — M86671 Other chronic osteomyelitis, right ankle and foot: Secondary | ICD-10-CM | POA: Diagnosis not present

## 2022-07-26 DIAGNOSIS — N186 End stage renal disease: Secondary | ICD-10-CM | POA: Diagnosis not present

## 2022-07-26 DIAGNOSIS — M109 Gout, unspecified: Secondary | ICD-10-CM | POA: Diagnosis not present

## 2022-07-26 DIAGNOSIS — I255 Ischemic cardiomyopathy: Secondary | ICD-10-CM | POA: Diagnosis not present

## 2022-07-26 DIAGNOSIS — E11621 Type 2 diabetes mellitus with foot ulcer: Secondary | ICD-10-CM | POA: Insufficient documentation

## 2022-07-26 DIAGNOSIS — T8189XA Other complications of procedures, not elsewhere classified, initial encounter: Secondary | ICD-10-CM | POA: Diagnosis not present

## 2022-07-26 DIAGNOSIS — E1140 Type 2 diabetes mellitus with diabetic neuropathy, unspecified: Secondary | ICD-10-CM | POA: Insufficient documentation

## 2022-07-26 DIAGNOSIS — Z794 Long term (current) use of insulin: Secondary | ICD-10-CM | POA: Insufficient documentation

## 2022-07-26 DIAGNOSIS — E1151 Type 2 diabetes mellitus with diabetic peripheral angiopathy without gangrene: Secondary | ICD-10-CM | POA: Insufficient documentation

## 2022-07-26 DIAGNOSIS — E1122 Type 2 diabetes mellitus with diabetic chronic kidney disease: Secondary | ICD-10-CM | POA: Insufficient documentation

## 2022-07-26 DIAGNOSIS — Z992 Dependence on renal dialysis: Secondary | ICD-10-CM | POA: Diagnosis not present

## 2022-07-26 MED ORDER — OXYCODONE-ACETAMINOPHEN 5-325 MG PO TABS: 1.0000 | ORAL_TABLET | Freq: Four times a day (QID) | ORAL | 0 refills | Status: DC | PRN

## 2022-07-26 NOTE — Progress Notes (Signed)
Jacob [name], Jacob [name] ([id number]) Visit Report for 07/26/2022 Arrival Information Details Patient Name: Date of Service: Jacob [name] 07/26/2022 8:15 A M Medical Record Number: [id number] Patient Account Number: [id number] Date of Birth/Sex: Treating RN: Sep 24, 1951 (71 y.o. Jacob [name] Primary Care Jacob [name]: Jacob [name] Other Clinician: Referring Jacob [name]: Treating Jacob [name]/Extender: Jacob [name] in Treatment: 21 Visit Information History Since Last Visit Added or deleted any medications: Yes Patient Arrived: Cane Any new allergies or adverse reactions: No Arrival Time: 07:57 Had a fall or experienced change in No Accompanied By: self activities of daily living that may affect Transfer Assistance: None risk of falls: Patient Identification Verified: Yes Signs or symptoms of abuse/neglect since No Secondary Verification Process Completed: Yes last visito Patient Requires Transmission-Based Precautions: No Hospitalized since last visit: Yes Patient Has Alerts: No Implantable device outside of the clinic No excluding cellular tissue based products placed in the center since last visit: Has Dressing in Place as Prescribed: Yes Has Footwear/Offloading in Place as Yes Prescribed: Left: Surgical Shoe with Pressure Relief Insole Pain Present Now: No Electronic Signature(s) Signed: 07/26/2022 5:08:49 PM By: Jacob Gouty RN, BSN Entered By: Jacob [name] on 07/26/2022 07:58:59 -------------------------------------------------------------------------------- Clinic Level of Care Assessment Details Patient Name: Date of Service: Jacob [name] 07/26/2022 8:15 A M Medical Record Number: [id number] Patient Account Number: [id number] Date of Birth/Sex: Treating RN: 09/12/1951 (71 y.o. Jacob [name] Primary Care Jacob [name]: Jacob [name] Other Clinician: Referring Jacob [name]: Treating Jacob [name]/Extender: Jacob [name] in Treatment: 21 Clinic Level of Care Assessment Items TOOL 4 Quantity Score '[]'$  - 0 Use when only an EandM is performed on FOLLOW-UP visit ASSESSMENTS - Nursing Assessment / Reassessment X- 1 10 Reassessment of Co-morbidities (includes updates in patient status) X- 1 5 Reassessment of Adherence to Treatment Plan ASSESSMENTS - Wound and Skin A ssessment / Reassessment '[]'$  - 0 Simple Wound Assessment / Reassessment - one wound X- 2 5 Complex Wound Assessment / Reassessment - multiple wounds '[]'$  - 0 Dermatologic / Skin Assessment (not related to wound area) ASSESSMENTS - Focused Assessment X- 1 5 Circumferential Edema Measurements - multi extremities '[]'$  - 0 Nutritional Assessment / Counseling / Intervention X- 1 5 Lower Extremity Assessment (monofilament, tuning fork, pulses) '[]'$  - 0 Peripheral Arterial Disease Assessment (using hand held doppler) ASSESSMENTS - Ostomy and/or Continence Assessment and Care '[]'$  - 0 Incontinence Assessment and Management '[]'$  - 0 Ostomy Care Assessment and Management (repouching, etc.) PROCESS - Coordination of Care X - Simple Patient / Family Education for ongoing care 1 15 '[]'$  - 0 Complex (extensive) Patient / Family Education for ongoing care X- 1 10 Staff obtains Programmer, systems, Records, T Results / Process Orders est X- 1 10 Staff telephones HHA, Nursing Homes / Clarify orders / etc '[]'$  - 0 Routine Transfer to another Facility (non-emergent condition) '[]'$  - 0 Routine Hospital Admission (non-emergent condition) '[]'$  - 0 New Admissions / Biomedical engineer / Ordering NPWT Apligraf, etc. , '[]'$  - 0 Emergency Hospital Admission (emergent condition) X- 1 10 Simple Discharge Coordination '[]'$  - 0 Complex (extensive) Discharge Coordination PROCESS - Special Needs '[]'$  - 0 Pediatric / Minor Patient Management '[]'$  - 0 Isolation Patient Management '[]'$  - 0 Hearing / Language / Visual special needs '[]'$  - 0 Assessment of Community assistance  (transportation, D/C planning, etc.) '[]'$  - 0 Additional assistance / Altered mentation '[]'$  - 0 Support Surface(s) Assessment (bed, cushion, seat, etc.) INTERVENTIONS - Wound Cleansing / Measurement '[]'$  -  0 Simple Wound Cleansing - one wound X- 2 5 Complex Wound Cleansing - multiple wounds X- 1 5 Wound Imaging (photographs - any number of wounds) '[]'$  - 0 Wound Tracing (instead of photographs) '[]'$  - 0 Simple Wound Measurement - one wound X- 2 5 Complex Wound Measurement - multiple wounds INTERVENTIONS - Wound Dressings X - Small Wound Dressing one or multiple wounds 1 10 '[]'$  - 0 Medium Wound Dressing one or multiple wounds '[]'$  - 0 Large Wound Dressing one or multiple wounds '[]'$  - 0 Application of Medications - topical '[]'$  - 0 Application of Medications - injection INTERVENTIONS - Miscellaneous '[]'$  - 0 External ear exam '[]'$  - 0 Specimen Collection (cultures, biopsies, blood, body fluids, etc.) '[]'$  - 0 Specimen(s) / Culture(s) sent or taken to Lab for analysis '[]'$  - 0 Patient Transfer (multiple staff / Civil Service fast streamer / Similar devices) '[]'$  - 0 Simple Staple / Suture removal (25 or less) '[]'$  - 0 Complex Staple / Suture removal (26 or more) '[]'$  - 0 Hypo / Hyperglycemic Management (close monitor of Blood Glucose) '[]'$  - 0 Ankle / Brachial Index (ABI) - do not check if billed separately X- 1 5 Vital Signs Has the patient been seen at the hospital within the last three years: Yes Total Score: 120 Level Of Care: New/Established - Level 4 Electronic Signature(s) Signed: 07/26/2022 5:08:49 PM By: Jacob Gouty RN, BSN Entered By: Jacob [name] on 07/26/2022 08:53:14 -------------------------------------------------------------------------------- Lower Extremity Assessment Details Patient Name: Date of Service: Jacob [name]. 07/26/2022 8:15 A M Medical Record Number: [id number] Patient Account Number: [id number] Date of Birth/Sex: Treating RN: [birth year] (71 y.o. Jacob [name] Primary Care  Jacob [name]: Jacob [name] Other Clinician: Referring Jacob [name]: Treating Jacob [name]/Extender: Jacob [name] in Treatment: 21 Edema Assessment Assessed: [name]: No] Patrice Paradise: No] Edema: [Left: N] [Right: o] Calf Left: Right: Point of Measurement: From Medial Instep 33.4 cm Ankle Left: Right: Point of Measurement: From Medial Instep 19.5 cm Vascular Assessment Pulses: Dorsalis Pedis Palpable: [Right:Yes] Electronic Signature(s) Signed: 07/26/2022 5:08:49 PM By: Jacob Gouty RN, BSN Entered By: Jacob [name] on 07/26/2022 08:04:28 -------------------------------------------------------------------------------- Multi Wound Chart Details Patient Name: Date of Service: Jacob [name]. 07/26/2022 8:15 A M Medical Record Number: [id number] Patient Account Number: [id number] Date of Birth/Sex: Treating RN: 1951/04/24 (71 y.o. M) Primary Care Rane Dumm: Jacob [name] Other Clinician: Referring Demri Poulton: Treating Minas Bonser/Extender: Jacob [name] in Treatment: 21 Vital Signs Height(in): 74 Capillary Blood Glucose(mg/dl): 202 Weight(lbs): 172 Pulse(bpm): 64 Body Mass Index(BMI): 22.1 Blood Pressure(mmHg): 111/69 Temperature(F): 97.8 Respiratory Rate(breaths/min): 18 Photos: [N/A:N/A] Right, Plantar Foot Right, Dorsal Foot N/A Wound Location: Blister Surgical Injury N/A Wounding Event: Diabetic Wound/Ulcer of the Lower Open Surgical Wound N/A Primary Etiology: Extremity N/A Diabetic Wound/Ulcer of the Lower N/A Secondary Etiology: Extremity Cataracts, Glaucoma, Anemia, Chronic Cataracts, Glaucoma, Anemia, Chronic N/A Comorbid History: Obstructive Pulmonary Disease Obstructive Pulmonary Disease (COPD), Congestive Heart Failure, (COPD), Congestive Heart Failure, Coronary Artery Disease, Coronary Artery Disease, Hypertension, Myocardial Infarction, Hypertension, Myocardial Infarction, Hepatitis C, Type  II Diabetes, Gout, Hepatitis C, Type II Diabetes, Gout, Osteoarthritis, Neuropathy Osteoarthritis, Neuropathy 03/08/2022 07/21/2022 N/A Date Acquired: 20 0 N/A Weeks of Treatment: Open Open N/A Wound Status: No No N/A Wound Recurrence: 0.9x1.1x3 1x0.1x0.2 N/A Measurements L x W x D (cm) 0.778 0.079 N/A A (cm) : rea 2.333 0.016 N/A Volume (cm) : 29.30% N/A N/A % Reduction in Area: -607.00% N/A N/A % Reduction in Volume: Grade 2 Full Thickness Without  Exposed N/A Classification: Support Structures Medium Medium N/A Exudate Amount: Serosanguineous Serosanguineous N/A Exudate Type: red, brown red, brown N/A Exudate Color: Thickened Distinct, outline attached N/A Wound Margin: Large (67-100%) Large (67-100%) N/A Granulation Amount: Red Red N/A Granulation Quality: Small (1-33%) None Present (0%) N/A Necrotic Amount: Fat Layer (Subcutaneous Tissue): Yes Fat Layer (Subcutaneous Tissue): Yes N/A Exposed Structures: Tendon: Yes Fascia: No Fascia: No Tendon: No Muscle: No Muscle: No Joint: No Joint: No Bone: No Bone: No None Small (1-33%) N/A Epithelialization: Callus: Yes No Abnormalities Noted N/A Periwound Skin Texture: No Abnormalities Noted No Abnormalities Noted N/A Periwound Skin Moisture: No Abnormalities Noted No Abnormalities Noted N/A Periwound Skin Color: No Abnormality No Abnormality N/A Temperature: Yes Yes N/A Tenderness on Palpation: Treatment Notes Electronic Signature(s) Signed: 07/26/2022 8:37:10 AM By: Fredirick Maudlin MD FACS Entered By: Fredirick Maudlin on 07/26/2022 08:37:10 -------------------------------------------------------------------------------- Multi-Disciplinary Care Plan Details Patient Name: Date of Service: Jacob [name]. 07/26/2022 8:15 A M Medical Record Number: [id number] Patient Account Number: [id number] Date of Birth/Sex: Treating RN: 01/15/1951 (71 y.o. Jacob [name] Primary Care Jebidiah Baggerly: Jacob [name] Other Clinician: Referring Catrina Fellenz: Treating Shonn Farruggia/Extender: Jacob [name] in Treatment: Martin reviewed with physician Active Inactive Nutrition Nursing Diagnoses: Impaired glucose control: actual or potential Potential for alteratiion in Nutrition/Potential for imbalanced nutrition Goals: Patient/caregiver will maintain therapeutic glucose control Date Initiated: 05/10/2022 Target Resolution Date: 08/11/2022 Goal Status: Active Interventions: Assess HgA1c results as ordered upon admission and as needed Provide education on elevated blood sugars and impact on wound healing Treatment Activities: Dietary management education, guidance and counseling : 05/10/2022 Patient referred to Primary Care Physician for further nutritional evaluation : 05/10/2022 Notes: Wound/Skin Impairment Nursing Diagnoses: Impaired tissue integrity Goals: Patient/caregiver will verbalize understanding of skin care regimen Date Initiated: 03/01/2022 Target Resolution Date: 08/11/2022 Goal Status: Active Interventions: Assess ulceration(s) every visit Treatment Activities: Skin care regimen initiated : 03/01/2022 Notes: Electronic Signature(s) Signed: 07/26/2022 5:08:49 PM By: Jacob Gouty RN, BSN Entered By: Jacob [name] on 07/26/2022 08:37:51 -------------------------------------------------------------------------------- Pain Assessment Details Patient Name: Date of Service: Jacob [name]. 07/26/2022 8:15 A M Medical Record Number: [id number] Patient Account Number: [id number] Date of Birth/Sex: Treating RN: Apr 12, 1951 (71 y.o. Jacob [name] Primary Care Ollin Hochmuth: Jacob [name] Other Clinician: Referring Adiana Smelcer: Treating Leanore Biggers/Extender: Jacob [name] in Treatment: 21 Active Problems Location of Pain Severity and Description of Pain Patient Has Paino No Patient Has Paino No Site  Locations With Dressing Change: No Duration of the Pain. Constant / Intermittento Intermittent Rate the pain. Current Pain Level: 0 Worst Pain Level: 8 Least Pain Level: 0 Character of Pain Describe the Pain: Aching Pain Management and Medication Current Pain Management: Medication: Yes Is the Current Pain Management Adequate: Adequate How does your wound impact your activities of daily livingo Sleep: No Bathing: No Appetite: No Relationship With Others: No Bladder Continence: No Emotions: Yes Bowel Continence: No Work: No Toileting: No Drive: No Dressing: No Hobbies: No Notes pt reports general aching all over yesterday Electronic Signature(s) Signed: 07/26/2022 5:08:49 PM By: Jacob Gouty RN, BSN Entered By: Jacob [name] on 07/26/2022 08:01:07 -------------------------------------------------------------------------------- Patient/Caregiver Education Details Patient Name: Date of Service: Jacob [name] 10/3/2023andnbsp8:15 A M Medical Record Number: [id number] Patient Account Number: [id number] Date of Birth/Gender: Treating RN: 05-05-1951 (71 y.o. Jacob [name] Primary Care Physician: Jacob [name] Other Clinician: Referring Physician: Treating Physician/Extender: Jacob [name] in Treatment: 21  Education Assessment Education Provided To: Patient Education Topics Provided Elevated Blood Sugar/ Impact on Healing: Methods: Explain/Verbal Responses: Reinforcements needed, State content correctly Offloading: Methods: Explain/Verbal Responses: Reinforcements needed, State content correctly Wound/Skin Impairment: Methods: Explain/Verbal Responses: Reinforcements needed, State content correctly Electronic Signature(s) Signed: 07/26/2022 5:08:49 PM By: Jacob Gouty RN, BSN Entered By: Jacob [name] on 07/26/2022 08:38:23 -------------------------------------------------------------------------------- Wound Assessment  Details Patient Name: Date of Service: Jacob [name]. 07/26/2022 8:15 A M Medical Record Number: [id number] Patient Account Number: [id number] Date of Birth/Sex: Treating RN: Dec 15, 1950 (71 y.o. Jacob [name] Primary Care Ashliegh Parekh: Jacob [name] Other Clinician: Referring Bryler Dibble: Treating Johnathon Mittal/Extender: Jacob [name] in Treatment: 21 Wound Status Wound Number: 8 Primary Diabetic Wound/Ulcer of the Lower Extremity Etiology: Wound Location: Right, Plantar Foot Wound Open Wounding Event: Blister Status: Date Acquired: 03/08/2022 Comorbid Cataracts, Glaucoma, Anemia, Chronic Obstructive Pulmonary Weeks Of Treatment: 20 History: Disease (COPD), Congestive Heart Failure, Coronary Artery Clustered Wound: No Disease, Hypertension, Myocardial Infarction, Hepatitis C, Type II Diabetes, Gout, Osteoarthritis, Neuropathy Photos Wound Measurements Length: (cm) 0.9 Width: (cm) 1.1 Depth: (cm) 3 Area: (cm) 0.778 Volume: (cm) 2.333 % Reduction in Area: 29.3% % Reduction in Volume: -607% Epithelialization: None Tunneling: No Undermining: No Wound Description Classification: Grade 2 Wound Margin: Thickened Exudate Amount: Medium Exudate Type: Serosanguineous Exudate Color: red, brown Foul Odor After Cleansing: No Slough/Fibrino Yes Wound Bed Granulation Amount: Large (67-100%) Exposed Structure Granulation Quality: Red Fascia Exposed: No Necrotic Amount: Small (1-33%) Fat Layer (Subcutaneous Tissue) Exposed: Yes Necrotic Quality: Adherent Slough Tendon Exposed: Yes Muscle Exposed: No Joint Exposed: No Bone Exposed: No Periwound Skin Texture Texture Color No Abnormalities Noted: No No Abnormalities Noted: Yes Callus: Yes Temperature / Pain Temperature: No Abnormality Moisture No Abnormalities Noted: Yes Tenderness on Palpation: Yes Electronic Signature(s) Signed: 07/26/2022 5:08:49 PM By: Jacob Gouty RN, BSN Entered By:  Jacob [name] on 07/26/2022 08:14:36 -------------------------------------------------------------------------------- Wound Assessment Details Patient Name: Date of Service: Jacob [name]. 07/26/2022 8:15 A M Medical Record Number: [id number] Patient Account Number: [id number] Date of Birth/Sex: Treating RN: August 17, 1951 (71 y.o. Jacob [name] Primary Care Gwenlyn Hottinger: Jacob [name] Other Clinician: Referring Reece Fehnel: Treating Shannan Garfinkel/Extender: Jacob [name] in Treatment: 21 Wound Status Wound Number: 9 Primary Open Surgical Wound Etiology: Wound Location: Right, Dorsal Foot Secondary Diabetic Wound/Ulcer of the Lower Extremity Wounding Event: Surgical Injury Etiology: Date Acquired: 07/21/2022 Wound Open Weeks Of Treatment: 0 Status: Clustered Wound: No Comorbid Cataracts, Glaucoma, Anemia, Chronic Obstructive Pulmonary History: Disease (COPD), Congestive Heart Failure, Coronary Artery Disease, Hypertension, Myocardial Infarction, Hepatitis C, Type II Diabetes, Gout, Osteoarthritis, Neuropathy Photos Wound Measurements Length: (cm) 1 Width: (cm) 0.1 Depth: (cm) 0.2 Area: (cm) 0.079 Volume: (cm) 0.016 % Reduction in Area: % Reduction in Volume: Epithelialization: Small (1-33%) Tunneling: No Undermining: No Wound Description Classification: Full Thickness Without Exposed Support Structures Wound Margin: Distinct, outline attached Exudate Amount: Medium Exudate Type: Serosanguineous Exudate Color: red, brown Foul Odor After Cleansing: No Slough/Fibrino No Wound Bed Granulation Amount: Large (67-100%) Exposed Structure Granulation Quality: Red Fascia Exposed: No Necrotic Amount: None Present (0%) Fat Layer (Subcutaneous Tissue) Exposed: Yes Tendon Exposed: No Muscle Exposed: No Joint Exposed: No Bone Exposed: No Periwound Skin Texture Texture Color No Abnormalities Noted: Yes No Abnormalities Noted: Yes Moisture  Temperature / Pain No Abnormalities Noted: Yes Temperature: No Abnormality Tenderness on Palpation: Yes Electronic Signature(s) Signed: 07/26/2022 5:08:49 PM By: Jacob Gouty RN, BSN Entered By: Jacob [name] on 07/26/2022 08:15:08 --------------------------------------------------------------------------------  Vitals Details Patient Name: Date of Service: ERCEL, PEPITONE 07/26/2022 8:15 A M Medical Record Number: [id number] Patient Account Number: [id number] Date of Birth/Sex: Treating RN: December 04, 1950 (71 y.o. Jacob [name] Primary Care Chisum Habenicht: Jacob [name] Other Clinician: Referring Kalani Sthilaire: Treating Omario Ander/Extender: Jacob [name] in Treatment: 21 Vital Signs Time Taken: 07:59 Temperature (F): 97.8 Height (in): 74 Pulse (bpm): 81 Weight (lbs): 172 Respiratory Rate (breaths/min): 18 Body Mass Index (BMI): 22.1 Blood Pressure (mmHg): 111/69 Capillary Blood Glucose (mg/dl): 202 Reference Range: 80 - 120 mg / dl Notes glucose per pt report this am Electronic Signature(s) Signed: 07/26/2022 5:08:49 PM By: Jacob Gouty RN, BSN Entered By: Jacob [name] on 07/26/2022 07:59:50

## 2022-07-26 NOTE — Telephone Encounter (Signed)
Patient called requesting a refill.  Epic LR: 07/11/2022  Pended Rx and sent to Dr. Sabra Heck for approval.

## 2022-07-26 NOTE — Progress Notes (Addendum)
Jacob [name], Jacob [name] ([id number]) Visit Report for 07/26/2022 Chief Complaint Document Details Patient Name: Date of Service: Jacob [name], Jacob [name] 07/26/2022 8:15 A M Medical Record Number: [id number] Patient Account Number: [id number] Date of Birth/Sex: Treating RN: [birth year] (71 y.o. M) Primary Care Provider: Alain Honey Other Clinician: Referring Provider: Treating Provider/Extender: Neil Crouch in Treatment: 21 Information Obtained from: Patient Chief Complaint Patient seen for complaints of Non-Healing Wounds x 3 Electronic Signature(s) Signed: 07/26/2022 8:37:16 AM By: Fredirick Maudlin MD FACS Entered By: Fredirick Maudlin on 07/26/2022 08:37:16 -------------------------------------------------------------------------------- HPI Details Patient Name: Date of Service: Jacob Ache. 07/26/2022 8:15 A M Medical Record Number: [id number] Patient Account Number: [id number] Date of Birth/Sex: Treating RN: 11-Dec-1950 (71 y.o. M) Primary Care Provider: Alain Honey Other Clinician: Referring Provider: Treating Provider/Extender: Neil Crouch in Treatment: 21 History of Present Illness Location: 2 ulcers noted to the left hallux Quality: the patient denies pain Duration: these were originally blisters towards the end of February and opened in early March Context: these wounds are secondary to pressure; he got new shoes in February and also started wearing a toe cover/sleeve Modifying Factors: pressure and friction are exacerbating factor HPI Description: 01/12/17- Mr. Laspina arrives for initial evaluation of 2 ulcers to his left hallux. He states that back in February he did receive a new pair of diabetic shoes and at that time he started wearing toe cover/sleeves and noticed development of a blister to the dorsal and plantar aspect. He has been seen at [facility] by both Dr. Amalia Hailey and Dr. Jacqualyn Posey. He last saw Dr.Wagoner on  3/8 at which time he was prescribed Silvadene and Keflex. He completed that dose of Keflex and states that he got a phone call to extend the Keflex prior to this appointment. He has a follow-up with Dr. Amalia Hailey on 3/26. He has been wearing a surgical shoe, no significant offloading to the plantar/medial ulcer but the strap does not extend over the dorsal ulcer. Topically he has been applying the Silvadene daily and a dry dressing. He is a diabetic, on insulin, with a recent A1c of 6.2% (11/10/16). He is a current smoker, 0.5 PPD. He has no known diagnosis of arterial insufficiency. 01/20/17- The patient arrives for follow up evaluation of his ulcers to the left hallux. He admits to continuing to smoke approximately 0.5ppd. He states that his diet has been somewhat uncontrolled with higher than normal glucose levels, this seems circumstantial as he had a death in the family as food and schedule has been disrupted. he admits to wearing the surgical show with offloading modifications while out of the home, but has been ambulating barefoot around the home. he saw Dr. Amalia Hailey yesterday for toenail trimming and callus shaving to the right plantar foot and investigation of his ulcers. 01/26/17- he is here for follow-up evaluation of his left hallux ulcers. He states he has been significantly more compliant in the use of his offloading surgical shoe, with the exception of "getting up for a glass of water" he states that he has worn it with any ambulation. He states he has decreased his smoking too, admitting to only a cigarette since yesterday. He states his blood sugar this morning was 147 and yesterday was 116. He is voicing no complaints or concerns 02/02/17- He is here for follow-up evaluation of his left hallux ulcers. The dorsal ulcer is crusted over/healed. He states he has minimized his ambulation over this past  week, much less than the previous week. He states his glucose levels have been between 120-150 about  half of the time, otherwise greater than 150. He cannot correlate any specific dietary changes. He does have an appointment with a dietitian next month. He was advised to keep a log of his glucose levels and his diet for that appointment. 02/06/17; patient comes in today for first application of the total contact cast. Her intake nurse reported the wounds look better therefore I did not actually see these before application of the cast. He'll return on Thursday for reapplication in the standard fashion 02/10/27- he is here for follow-up evaluation of his left plantar hallux ulcer. He came in Monday for application of a total contact cast. He states that he was tolerating that Monday, but noticed on Tuesday that his foot began to slide in the cast. He also is complaining of the heaviness of the cast, as he ambulates as his primary form of transportation. He continues to smoke,. He has not contacted his cardiologist regarding Nicorette gum. He states his blood sugars have been slightly elevated, this morning being 206. He states that he will complete an antibiotic tonight. He states this was prescribed by Dr. Jacqualyn Posey. He states that he has refilled this prescription twice, without direction to continue taking this medication. 02/16/17; patient wound about the same as last week per her description. Rolled edges of callus scan and nonviable subcutaneous tissue. Using Prisma. Apparently used topical antibiotic instead of the Prisma over the course of this week. He has not a total contact cast candidate secondary to ambulation needs and unsteadiness on his feet per discussion with our intake nurse 03/02/17 patient requires debridement. He has a small wound on the right great toe which probes however does not approach bone. What I can see of the base of this wound looks reasonably healthy and I think this is largely in offloading issue with the second toe crossing over the first. He did not tolerate a  total contact cast has balance issues 5/171/8. 0.2x0.2x0.2 less depth. Using prisma 03/16/17; no major change. Still a small probing wound with copious amounts of surrounding callus skin nonviable subcutaneous tissue all of which debrided. Still using Prisma. I suspect the dimensions post debridement are about the same as listed above 0.2 x 0.2 x 0.2 03/23/17; no major change using Prisma. Once again a large amount of callus nonviable subcutaneous tissue around the wound. I went over the offloading issues with the patient and the nurse. He did not tolerate a total contact cast he uses public transportation among other issues. 03/30/17; no major change using Prisma. Once again a large amount of callus and nonviable tissue around the wound requires debridement. Saw Dr. Amalia Hailey who pared calluses on the other foot. 04/06/17; the wound is much smaller there is callus around this although I think I can see the base of the wound and I elected not to do any debridement. 04/13/17 this is a wound I did not debridement last week left callus intact. He arrives today with the area much in the same predicament however this time I remove the callus to find a deep probing hole. This is disappointing this is not go to bone. We have been using Prisma. 04/20/17 plantar left first toe. Again although a lot of circumferential callus around the wound which requires debridement however this does not appear to be a probing area. Culture I did last week show coag-negative staph. I thought this was probably not  something that was worth treating but In the interim he was seen by his primary care with parotitis. He is now on clindamycin and Cipro and the Staphylococcus we cultured is Cipro sensitive [lugdunensis} 04/27/17; plantar left first toe. Again a lot of circumferential callus around a small open area. Removing the callus also nonviable subcutaneous tissue. The wound looks better today. He has completed his antibiotics for the  parotitis. I didn't think additional antibiotics were necessary in terms of the wound 05/11/17; plantar first toe. I thought this might be closed this week however once again he has a deep probing small hole. Using pickups and a scalpel debridement of thick callus and subcutaneous tissue from around the wound reveals a depth of about 0.4 cm. We have not been able to offload the area. There is no obvious infection here. No evidence of ischemia 05/18/17; since his last visit he's been in hospital for oral surgery apparently to remove a retained surgical screw. He has swelling of his jaw and has some discomfort. He arrives today with the plantar left first toe in the same predicament. No major change. Each time he comes in here he has thick adherent callus 05/25/17; arrives today with roughly the same condition of the plantar left great toe. Perhaps somewhat better less callus. There are no options for offloading. He is on his feet a lot and I think this is largely a pressure issue X-ray I did last week showed no evidence of osteomyelitis. He has a toe separator as the great toe tends to ride under the second toe when he is walking. I'm hopeful that this will allow for a little less pressure on the plantar aspect of the left great toe 06/07/2017 -- he is working hard on giving up smoking and other than that has been wearing his diabetic shoes and is also trying to offload as much as possible. 06/15/17; patient arrived in clinic today with a difficult area on his left medial great toe closed over. There is still amount of callus on this however I don't believe there is any underlying open wound. He has custom shoes with inserts. I still think he is going to need to offload this area going forward. He also follows with Dr. Amalia Hailey of podiatry for routine foot care READMISSION 11/01/16; patient arrives in clinic today with a five-day history of a new area on the previously problematic left great toe. He is wearing  his diabetic shoes and he apparently has a new pair coming. The patient is a type II diabetic with neuropathy. He follows with Dr. Amalia Hailey of podiatry for pre-ulcerative callus on his bilateral feet last seen on 08/24/17. He has developed a large blister on top of his left great toe this is since gone down somewhat. He is not been dressing this specifically. He does not have a history of significant PAD previous ABIs in this clinic were1.1 11/06/17; left great toe has epithelialized since last time. I think this was caused by a insoles an issue pushing the toe against the dorsal aspect of the shoe itself has new diabetic shoes coming hopefully will have more forefoot depth. READMISSION 03/01/2022 This is a now 71 year old man with a past medical history significant for type 2 diabetes mellitus with end-stage renal disease on hemodialysis as as well as congestive heart failure, ischemic cardiomyopathy, hypertension, and prior diabetic foot ulcers. He has had multiple issues with his hemodialysis access. He had a prior left radiocephalic fistula that failed. In October 2020, he  had a left brachiobasilic fistula. He apparently underwent a procedure at an outside vascular office on November 09, 2021. Subsequent to that, he developed a wound on his left posterior forearm just distal to the elbow as well as wounds on 2 of his left digits. He was diagnosed with steal syndrome and recently underwent ligation of his fistula with placement of a hemodialysis catheter. He is here today for assistance in healing the wounds that developed related to steal syndrome. He says that he has been applying Neosporin to the sites and they are fairly sensitive to the touch. 03/08/2022: The wounds on his fingers are much smaller and have just a little bit of accumulated thin eschar. The wound on his left posterior forearm is also smaller but has accumulated both slough and eschar. He also has a thick callus on his right fifth  metatarsal head plantar surface and unfortunately, it appears that there is an open wound underneath the callus. We have been using Iodosorb with Hydrofera Blue to all of his open wounds. Pain is much improved from last week. 03/15/2022: The wounds on his fingers have healed. The wound on his left posterior forearm is smaller and has a nice layer of granulation tissue with just a bit of accumulated slough. He has built up additional callus around the right fifth metatarsal head plantar surface, but the underlying wound has a good surface with granulation tissue present. We have been using Iodosorb with Hydrofera Blue. He did not endorse any pain this week. 03/22/2022: The wound on his left posterior forearm continues to contract. There is good granulation tissue present and just a little bit of periwound eschar and overlying slough. The right fifth metatarsal head wound has once again accumulated some callus and there is slough within the wound. The intake nurse measured a area of undermining from 7-10 o'clock. 03/29/2022: The patient saw podiatry this week for nail trim and they also pared back a number of calluses. The wound on his left posterior forearm is substantially smaller with just some overlying thin eschar. The right fifth metatarsal head has a bit of callus accumulation as well as some slough in the wound bed but is otherwise clean. It is smaller today, as well. 04/05/2022: The wound on his left posterior forearm is nearly closed with just a small amount of overlying eschar. The right fifth metatarsal head wound is smaller again this week. There is some periwound callus accumulation and some slough in the wound bed. He is going to get new dialysis access placed next week. 04/19/2022: The wound on his left posterior forearm is closed. The right fifth metatarsal head wound is about the same size with periwound callus and slough accumulation. He did have a new right forearm PTFE AV graft placed  last week. His right forearm and hand are quite swollen, warm, and red today. He was apparently seen in the emergency room yesterday and they did not think it was infected and attributed the swelling to normal postop findings. 04/28/2022: The wound on his foot has accumulated a lot of periwound callus, but the actual open surface is quite small. There is just a little bit of slough on the surface. 05/03/2022: His wound is even smaller today with less periwound callus accumulation. He continues to build up slough but the underlying tissue is healthy and viable-appearing. 05/10/2022: The orifice of the wound continues to contract, but there is some undermining and periwound callus formation. No significant accumulation of slough. 05/17/2022: The wound is about  the same size today, perhaps slightly smaller. He does have periwound callus accumulation, but it does not overlap or create any undermining. There is some slough in the base of the wound. 05/31/2022: The wound measured larger today on intake, but this may be secondary to the fairly aggressive debridement I performed at his last visit. Despite this, he has built up callus around the wound again. There is some epiboly around the orifice. There is slough within the wound and some undermining is present. His blood sugar remains very poorly controlled; it was 300 today. 06/16/2022: No real change in the size of his wound. He has callus accumulation around the perimeter. Last visit we changed his dressing to Prisma; I have not seen much in the way of filling in from the base yet. 06/30/2022: No real change to his wound. There is some undermining that has been created by accumulated callus. We are struggling with poor glucose control in the setting of immunodeficiency and end-stage renal disease on dialysis. 07/14/2022: The wound has gotten deeper and a probe placed into the wound can be palpated coming to just under the skin on the dorsal aspect of his foot.  The wound itself is fairly clean and there is no malodor or purulent drainage present. He is scheduled to see podiatry next week and hopefully get x-rays performed at that visit. He is also requesting a knee scooter. 07/26/2022: A couple of days after our last visit, he presented to the emergency department with worsening of his wound. Radiographic imaging was consistent with osteomyelitis. He was taken to the operating room by Dr. Loel Lofty of podiatry. He underwent irrigation and debridement of his right foot wound and resection of the right fourth metatarsal head and proximal phalanx. Cultures grew out staph. He is currently receiving IV vancomycin and ceftazidime with hemodialysis. The wound is very clean today. There is no slough buildup. No purulent drainage. It does probe through to the dorsum of his foot to his surgical incision. Sutures are still in place. Electronic Signature(s) Signed: 07/26/2022 8:40:57 AM By: Fredirick Maudlin MD FACS Entered By: Fredirick Maudlin on 07/26/2022 08:40:57 -------------------------------------------------------------------------------- Physical Exam Details Patient Name: Date of Service: Jacob Ache. 07/26/2022 8:15 A M Medical Record Number: [id number] Patient Account Number: [id number] Date of Birth/Sex: Treating RN: Mar 28, 1951 (71 y.o. M) Primary Care Provider: Alain Honey Other Clinician: Referring Provider: Treating Provider/Extender: Neil Crouch in Treatment: 21 Constitutional . . . . No acute distress.[name] Respiratory Normal work of breathing on room air.. Notes 07/26/2022: The wound is very clean today. There is no slough buildup. No purulent drainage. It does probe through to the dorsum of his foot to his surgical incision. Sutures are still in place. Electronic Signature(s) Signed: 07/26/2022 8:41:35 AM By: Fredirick Maudlin MD FACS Entered By: Fredirick Maudlin on 07/26/2022  08:41:35 -------------------------------------------------------------------------------- Physician Orders Details Patient Name: Date of Service: Jacob Ache. 07/26/2022 8:15 A M Medical Record Number: [id number] Patient Account Number: [id number] Date of Birth/Sex: Treating RN: Mar 15, 1951 (71 y.o. [name] Primary Care Provider: Alain Honey Other Clinician: Referring Provider: Treating Provider/Extender: Neil Crouch in Treatment: 21 Verbal / Phone Orders: No Diagnosis Coding ICD-10 Coding Code Description [phone] Non-pressure chronic ulcer of other part of right foot with fat layer exposed E11.22 Type 2 diabetes mellitus with diabetic chronic kidney disease J44.9 Chronic obstructive pulmonary disease, unspecified I50.42 Chronic combined systolic (congestive) and diastolic (congestive) heart failure I10 Essential (primary)  hypertension N25.81 Secondary hyperparathyroidism of renal origin N18.6 End stage renal disease B20 Human immunodeficiency virus [HIV] disease M86.671 Other chronic osteomyelitis, right ankle and foot Follow-up Appointments ppointment in 1 week. - Dr. Celine Ahr RM 1 with [name] Return A Anesthetic Wound #8 Right,Plantar Foot (In clinic) Topical Lidocaine 4% applied to wound bed Bathing/ Shower/ Hygiene Other Bathing/Shower/Hygiene Orders/Instructions: - Change dressing after bathing Off-Loading Other: - minimal weight bearing right foot Home Health dmit to [facility] for wound care. May utilize formulary equivalent dressing for wound treatment orders unless otherwise specified. - A pt has dialysis MWF mornings Wound Treatment Wound #8 - Foot Wound Laterality: Plantar, Right Cleanser: Soap and Water 3 x Per Week/30 Days Discharge Instructions: May shower and wash wound with dial antibacterial soap and water prior to dressing change. Cleanser: Wound Cleanser (Generic) 3 x Per Week/30 Days Discharge Instructions:  Cleanse the wound with wound cleanser prior to applying a clean dressing using gauze sponges, not tissue or cotton balls. Prim Dressing: Iodoform packing strip 1/2 (in) 3 x Per Week/30 Days ary Discharge Instructions: Lightly pack as instructed Secondary Dressing: Optifoam Non-Adhesive Dressing, 4x4 in 3 x Per Week/30 Days Discharge Instructions: Apply over primary dressing cut to make foam donut Secondary Dressing: Woven Gauze Sponges 2x2 in 3 x Per Week/30 Days Discharge Instructions: Apply over primary dressing as directed. Secured With: Elastic Bandage 4 inch (ACE bandage) 3 x Per Week/30 Days Discharge Instructions: Secure with ACE bandage as directed. Secured With: 48M Medipore H Soft Cloth Surgical T ape, 4 x 10 (in/yd) 3 x Per Week/30 Days Discharge Instructions: Secure with tape as directed. Wound #9 - Foot Wound Laterality: Dorsal, Right Prim Dressing: Gauze Non-Woven Sterile Sponge 4x4 (in/in) ary 3 x Per Week/30 Days Secured With: [employer], 4.5x3.1 (in/yd) 3 x Per Week/30 Days Discharge Instructions: Secure with Kerlix as directed. Electronic Signature(s) Signed: 07/26/2022 10:16:01 AM By: Fredirick Maudlin MD FACS Signed: 07/26/2022 5:08:49 PM By: Baruch Gouty RN, BSN Previous Signature: 07/26/2022 8:43:42 AM Version By: Fredirick Maudlin MD FACS Entered By: Baruch Gouty on 07/26/2022 08:51:17 -------------------------------------------------------------------------------- Problem List Details Patient Name: Date of Service: Jacob Ache. 07/26/2022 8:15 A M Medical Record Number: [id number] Patient Account Number: [id number] Date of Birth/Sex: Treating RN: 11/11/1950 (71 y.o. M) Primary Care Provider: Alain Honey Other Clinician: Referring Provider: Treating Provider/Extender: Neil Crouch in Treatment: 21 Active Problems ICD-10 Encounter Code Description Active Date MDM Diagnosis [phone] Non-pressure chronic ulcer of  other part of right foot with fat layer exposed 03/08/2022 No Yes E11.22 Type 2 diabetes mellitus with diabetic chronic kidney disease 03/01/2022 No Yes J44.9 Chronic obstructive pulmonary disease, unspecified 03/01/2022 No Yes I50.42 Chronic combined systolic (congestive) and diastolic (congestive) heart failure 03/01/2022 No Yes I10 Essential (primary) hypertension 03/01/2022 No Yes N25.81 Secondary hyperparathyroidism of renal origin 03/01/2022 No Yes N18.6 End stage renal disease 03/01/2022 No Yes B20 Human immunodeficiency virus [HIV] disease 03/01/2022 No Yes M86.671 Other chronic osteomyelitis, right ankle and foot 07/26/2022 No Yes Inactive Problems Resolved Problems ICD-10 Code Description Active Date Resolved Date L98.499 Non-pressure chronic ulcer of skin of other sites with unspecified severity 03/01/2022 03/01/2022 Electronic Signature(s) Signed: 07/26/2022 8:36:49 AM By: Fredirick Maudlin MD FACS Entered By: Fredirick Maudlin on 07/26/2022 08:36:49 -------------------------------------------------------------------------------- Progress Note Details Patient Name: Date of Service: Jacob Ache. 07/26/2022 8:15 A M Medical Record Number: [id number] Patient Account Number: [id number] Date of Birth/Sex: Treating RN: 07-Apr-1951 (71 y.o. M) Primary Care Provider:  Alain Honey Other Clinician: Referring Provider: Treating Provider/Extender: Neil Crouch in Treatment: 21 Subjective Chief Complaint Information obtained from Patient Patient seen for complaints of Non-Healing Wounds x 3 History of Present Illness (HPI) The following HPI elements were documented for the patient's wound: Location: 2 ulcers noted to the left hallux Quality: the patient denies pain Duration: these were originally blisters towards the end of February and opened in early March Context: these wounds are secondary to pressure; he got new shoes in February and also started wearing a toe  cover/sleeve Modifying Factors: pressure and friction are exacerbating factor 01/12/17- Mr. Consalvo arrives for initial evaluation of 2 ulcers to his left hallux. He states that back in February he did receive a new pair of diabetic shoes and at that time he started wearing toe cover/sleeves and noticed development of a blister to the dorsal and plantar aspect. He has been seen at [facility] by both Dr. Amalia Hailey and Dr. Jacqualyn Posey. He last saw Dr.Wagoner on 3/8 at which time he was prescribed Silvadene and Keflex. He completed that dose of Keflex and states that he got a phone call to extend the Keflex prior to this appointment. He has a follow-up with Dr. Amalia Hailey on 3/26. He has been wearing a surgical shoe, no significant offloading to the plantar/medial ulcer but the strap does not extend over the dorsal ulcer. T opically he has been applying the Silvadene daily and a dry dressing. He is a diabetic, on insulin, with a recent A1c of 6.2% (11/10/16). He is a current smoker, 0.5 PPD. He has no known diagnosis of arterial insufficiency. 01/20/17- The patient arrives for follow up evaluation of his ulcers to the left hallux. He admits to continuing to smoke approximately 0.5ppd. He states that his diet has been somewhat uncontrolled with higher than normal glucose levels, this seems circumstantial as he had a death in the family as food and schedule has been disrupted. he admits to wearing the surgical show with offloading modifications while out of the home, but has been ambulating barefoot around the home. he saw Dr. Amalia Hailey yesterday for toenail trimming and callus shaving to the right plantar foot and investigation of his ulcers. 01/26/17- he is here for follow-up evaluation of his left hallux ulcers. He states he has been significantly more compliant in the use of his offloading surgical shoe, with the exception of "getting up for a glass of water" he states that he has worn it with any ambulation. He states  he has decreased his smoking too, admitting to only a cigarette since yesterday. He states his blood sugar this morning was 147 and yesterday was 116. He is voicing no complaints or concerns 02/02/17- He is here for follow-up evaluation of his left hallux ulcers. The dorsal ulcer is crusted over/healed. He states he has minimized his ambulation over this past week, much less than the previous week. He states his glucose levels have been between 120-150 about half of the time, otherwise greater than 150. He cannot correlate any specific dietary changes. He does have an appointment with a dietitian next month. He was advised to keep a log of his glucose levels and his diet for that appointment. 02/06/17; patient comes in today for first application of the total contact cast. Her intake nurse reported the wounds look better therefore I did not actually see these before application of the cast. He'll return on Thursday for reapplication in the standard fashion 02/10/27- he is  here for follow-up evaluation of his left plantar hallux ulcer. He came in Monday for application of a total contact cast. He states that he was tolerating that Monday, but noticed on Tuesday that his foot began to slide in the cast. He also is complaining of the heaviness of the cast, as he ambulates as his primary form of transportation. He continues to smoke,. He has not contacted his cardiologist regarding Nicorette gum. He states his blood sugars have been slightly elevated, this morning being 206. He states that he will complete an antibiotic tonight. He states this was prescribed by Dr. Jacqualyn Posey. He states that he has refilled this prescription twice, without direction to continue taking this medication. 02/16/17; patient wound about the same as last week per her description. Rolled edges of callus scan and nonviable subcutaneous tissue. Using Prisma. Apparently used topical antibiotic instead of the Prisma over the course of this  week. He has not a total contact cast candidate secondary to ambulation needs and unsteadiness on his feet per discussion with our intake nurse 03/02/17 patient requires debridement. He has a small wound on the right great toe which probes however does not approach bone. What I can see of the base of this wound looks reasonably healthy and I think this is largely in offloading issue with the second toe crossing over the first. He did not tolerate a total contact cast has balance issues 5/171/8. 0.2x0.2x0.2 less depth. Using prisma 03/16/17; no major change. Still a small probing wound with copious amounts of surrounding callus skin nonviable subcutaneous tissue all of which debrided. Still using Prisma. I suspect the dimensions post debridement are about the same as listed above 0.2 x 0.2 x 0.2 03/23/17; no major change using Prisma. Once again a large amount of callus nonviable subcutaneous tissue around the wound. I went over the offloading issues with the patient and the nurse. He did not tolerate a total contact cast he uses public transportation among other issues. 03/30/17; no major change using Prisma. Once again a large amount of callus and nonviable tissue around the wound requires debridement. Saw Dr. Amalia Hailey who pared calluses on the other foot. 04/06/17; the wound is much smaller there is callus around this although I think I can see the base of the wound and I elected not to do any debridement. 04/13/17 this is a wound I did not debridement last week left callus intact. He arrives today with the area much in the same predicament however this time I remove the callus to find a deep probing hole. This is disappointing this is not go to bone. We have been using Prisma. 04/20/17 plantar left first toe. Again although a lot of circumferential callus around the wound which requires debridement however this does not appear to be a probing area. Culture I did last week show coag-negative staph. I thought  this was probably not something that was worth treating but In the interim he was seen by his primary care with parotitis. He is now on clindamycin and Cipro and the Staphylococcus we cultured is Cipro sensitive [lugdunensis} 04/27/17; plantar left first toe. Again a lot of circumferential callus around a small open area. Removing the callus also nonviable subcutaneous tissue. The wound looks better today. He has completed his antibiotics for the parotitis. I didn't think additional antibiotics were necessary in terms of the wound 05/11/17; plantar first toe. I thought this might be closed this week however once again he has a deep probing small hole. Using  pickups and a scalpel debridement of thick callus and subcutaneous tissue from around the wound reveals a depth of about 0.4 cm. We have not been able to offload the area. There is no obvious infection here. No evidence of ischemia 05/18/17; since his last visit he's been in hospital for oral surgery apparently to remove a retained surgical screw. He has swelling of his jaw and has some discomfort. He arrives today with the plantar left first toe in the same predicament. No major change. Each time he comes in here he has thick adherent callus 05/25/17; arrives today with roughly the same condition of the plantar left great toe. Perhaps somewhat better less callus. There are no options for offloading. He is on his feet a lot and I think this is largely a pressure issue X-ray I did last week showed no evidence of osteomyelitis. He has a toe separator as the great toe tends to ride under the second toe when he is walking. I'm hopeful that this will allow for a little less pressure on the plantar aspect of the left great toe 06/07/2017 -- he is working hard on giving up smoking and other than that has been wearing his diabetic shoes and is also trying to offload as much as possible. 06/15/17; patient arrived in clinic today with a difficult area on his left  medial great toe closed over. There is still amount of callus on this however I don't believe there is any underlying open wound. He has custom shoes with inserts. I still think he is going to need to offload this area going forward. He also follows with Dr. Amalia Hailey of podiatry for routine foot care READMISSION 11/01/16; patient arrives in clinic today with a five-day history of a new area on the previously problematic left great toe. He is wearing his diabetic shoes and he apparently has a new pair coming. The patient is a type II diabetic with neuropathy. He follows with Dr. Amalia Hailey of podiatry for pre-ulcerative callus on his bilateral feet last seen on 08/24/17. He has developed a large blister on top of his left great toe this is since gone down somewhat. He is not been dressing this specifically. He does not have a history of significant PAD previous ABIs in this clinic were1.1 11/06/17; left great toe has epithelialized since last time. I think this was caused by a insoles an issue pushing the toe against the dorsal aspect of the shoe itself has new diabetic shoes coming hopefully will have more forefoot depth. READMISSION 03/01/2022 This is a now 71 year old man with a past medical history significant for type 2 diabetes mellitus with end-stage renal disease on hemodialysis as as well as congestive heart failure, ischemic cardiomyopathy, hypertension, and prior diabetic foot ulcers. He has had multiple issues with his hemodialysis access. He had a prior left radiocephalic fistula that failed. In October 2020, he had a left brachiobasilic fistula. He apparently underwent a procedure at an outside vascular office on November 09, 2021. Subsequent to that, he developed a wound on his left posterior forearm just distal to the elbow as well as wounds on 2 of his left digits. He was diagnosed with steal syndrome and recently underwent ligation of his fistula with placement of a hemodialysis catheter. He is  here today for assistance in healing the wounds that developed related to steal syndrome. He says that he has been applying Neosporin to the sites and they are fairly sensitive to the touch. 03/08/2022: The wounds on his  fingers are much smaller and have just a little bit of accumulated thin eschar. The wound on his left posterior forearm is also smaller but has accumulated both slough and eschar. He also has a thick callus on his right fifth metatarsal head plantar surface and unfortunately, it appears that there is an open wound underneath the callus. We have been using Iodosorb with Hydrofera Blue to all of his open wounds. Pain is much improved from last week. 03/15/2022: The wounds on his fingers have healed. The wound on his left posterior forearm is smaller and has a nice layer of granulation tissue with just a bit of accumulated slough. He has built up additional callus around the right fifth metatarsal head plantar surface, but the underlying wound has a good surface with granulation tissue present. We have been using Iodosorb with Hydrofera Blue. He did not endorse any pain this week. 03/22/2022: The wound on his left posterior forearm continues to contract. There is good granulation tissue present and just a little bit of periwound eschar and overlying slough. The right fifth metatarsal head wound has once again accumulated some callus and there is slough within the wound. The intake nurse measured a area of undermining from 7-10 o'clock. 03/29/2022: The patient saw podiatry this week for nail trim and they also pared back a number of calluses. The wound on his left posterior forearm is substantially smaller with just some overlying thin eschar. The right fifth metatarsal head has a bit of callus accumulation as well as some slough in the wound bed but is otherwise clean. It is smaller today, as well. 04/05/2022: The wound on his left posterior forearm is nearly closed with just a small amount  of overlying eschar. The right fifth metatarsal head wound is smaller again this week. There is some periwound callus accumulation and some slough in the wound bed. He is going to get new dialysis access placed next week. 04/19/2022: The wound on his left posterior forearm is closed. The right fifth metatarsal head wound is about the same size with periwound callus and slough accumulation. He did have a new right forearm PTFE AV graft placed last week. His right forearm and hand are quite swollen, warm, and red today. He was apparently seen in the emergency room yesterday and they did not think it was infected and attributed the swelling to normal postop findings. 04/28/2022: The wound on his foot has accumulated a lot of periwound callus, but the actual open surface is quite small. There is just a little bit of slough on the surface. 05/03/2022: His wound is even smaller today with less periwound callus accumulation. He continues to build up slough but the underlying tissue is healthy and viable-appearing. 05/10/2022: The orifice of the wound continues to contract, but there is some undermining and periwound callus formation. No significant accumulation of slough. 05/17/2022: The wound is about the same size today, perhaps slightly smaller. He does have periwound callus accumulation, but it does not overlap or create any undermining. There is some slough in the base of the wound. 05/31/2022: The wound measured larger today on intake, but this may be secondary to the fairly aggressive debridement I performed at his last visit. Despite this, he has built up callus around the wound again. There is some epiboly around the orifice. There is slough within the wound and some undermining is present. His blood sugar remains very poorly controlled; it was 300 today. 06/16/2022: No real change in the size of his wound.  He has callus accumulation around the perimeter. Last visit we changed his dressing to Prisma; I  have not seen much in the way of filling in from the base yet. 06/30/2022: No real change to his wound. There is some undermining that has been created by accumulated callus. We are struggling with poor glucose control in the setting of immunodeficiency and end-stage renal disease on dialysis. 07/14/2022: The wound has gotten deeper and a probe placed into the wound can be palpated coming to just under the skin on the dorsal aspect of his foot. The wound itself is fairly clean and there is no malodor or purulent drainage present. He is scheduled to see podiatry next week and hopefully get x-rays performed at that visit. He is also requesting a knee scooter. 07/26/2022: A couple of days after our last visit, he presented to the emergency department with worsening of his wound. Radiographic imaging was consistent with osteomyelitis. He was taken to the operating room by Dr. Loel Lofty of podiatry. He underwent irrigation and debridement of his right foot wound and resection of the right fourth metatarsal head and proximal phalanx. Cultures grew out staph. He is currently receiving IV vancomycin and ceftazidime with hemodialysis. The wound is very clean today. There is no slough buildup. No purulent drainage. It does probe through to the dorsum of his foot to his surgical incision. Sutures are still in place. Patient History Information obtained from Patient. Family History Diabetes - Siblings,Father, Heart Disease - Father,Siblings, Hypertension - Father,Siblings, No family history of Cancer, Hereditary Spherocytosis, Kidney Disease, Lung Disease, Seizures, Stroke, Thyroid Problems, Tuberculosis. Social History Current every day smoker - 1/2 ppd / 24yr, Marital Status - Single, Alcohol Use - Moderate - 6 standard drinks per week, Drug Use - Prior History - Polysubstance Abuse; clean for 9 years, Caffeine Use - Rarely - tea. Medical History Eyes Patient has history of Cataracts - mild,  Glaucoma Denies history of Optic Neuritis Ear/Nose/Mouth/Throat Denies history of Chronic sinus problems/congestion, Middle ear problems Hematologic/Lymphatic Patient has history of Anemia Denies history of Hemophilia, Human Immunodeficiency Virus, Lymphedema, Sickle Cell Disease Respiratory Patient has history of Chronic Obstructive Pulmonary Disease (COPD) Denies history of Aspiration, Asthma, Pneumothorax, Sleep Apnea, Tuberculosis Cardiovascular Patient has history of Congestive Heart Failure, Coronary Artery Disease, Hypertension, Myocardial Infarction - 2004 Denies history of Angina, Arrhythmia, Deep Vein Thrombosis, Hypotension, Peripheral Arterial Disease, Peripheral Venous Disease, Phlebitis, Vasculitis Gastrointestinal Patient has history of Hepatitis C - 1968 Denies history of Cirrhosis , Colitis, Crohnoos, Hepatitis A, Hepatitis B Endocrine Patient has history of Type II Diabetes Denies history of Type I Diabetes Genitourinary Denies history of End Stage Renal Disease Immunological Denies history of Lupus Erythematosus, Raynaudoos, Scleroderma Integumentary (Skin) Denies history of History of Burn Musculoskeletal Patient has history of Gout, Osteoarthritis Denies history of Rheumatoid Arthritis, Osteomyelitis Neurologic Patient has history of Neuropathy Denies history of Dementia, Quadriplegia, Paraplegia, Seizure Disorder Oncologic Denies history of Received Chemotherapy, Received Radiation Psychiatric Denies history of Anorexia/bulimia, Confinement Anxiety Hospitalization/Surgery History - Colonoscopy. - CABG. - Laproscopic Cholecystectomy. - Left Hip Fx. - Left Jaw Fx. - Lumbar Laminectomy. Medical A Surgical History Notes nd Constitutional Symptoms (General Health) Insomnia Syncope Alcohol Dependence HIV Hematologic/Lymphatic Hyperlipidemia Hyperglycemia Cardiovascular CABG, ischemic cardiomyopathy Gastrointestinal GERD Genitourinary Syphilis  CKD,Nocturia Immunological HIV Musculoskeletal Hip Fx BiLateral Bunions Unspecified Arthritis Neurologic chronic back pain Objective Constitutional No acute distress.. Vitals Time Taken: 7:59 AM, Height: 74 in, Weight: 172 lbs, BMI: 22.1, Temperature: 97.8 F, Pulse: 81 bpm, Respiratory Rate:  18 breaths/min, Blood Pressure: 111/69 mmHg, Capillary Blood Glucose: 202 mg/dl. General Notes: glucose per pt report this am Respiratory Normal work of breathing on room air.. General Notes: 07/26/2022: The wound is very clean today. There is no slough buildup. No purulent drainage. It does probe through to the dorsum of his foot to his surgical incision. Sutures are still in place. Integumentary (Hair, Skin) Wound #8 status is Open. Original cause of wound was Blister. The date acquired was: 03/08/2022. The wound has been in treatment 20 weeks. The wound is located on the [place]. The wound measures 0.9cm length x 1.1cm width x 3cm depth; 0.778cm^2 area and 2.333cm^3 volume. There is tendon and Fat Layer (Subcutaneous Tissue) exposed. There is no tunneling or undermining noted. There is a medium amount of serosanguineous drainage noted. The wound margin is thickened. There is large (67-100%) red granulation within the wound bed. There is a small (1-33%) amount of necrotic tissue within the wound bed including Adherent Slough. The periwound skin appearance had no abnormalities noted for moisture. The periwound skin appearance had no abnormalities noted for color. The periwound skin appearance exhibited: Callus. Periwound temperature was noted as No Abnormality. The periwound has tenderness on palpation. Wound #9 status is Open. Original cause of wound was Surgical Injury. The date acquired was: 07/21/2022. The wound is located on the Right,Dorsal Foot. The wound measures 1cm length x 0.1cm width x 0.2cm depth; 0.079cm^2 area and 0.016cm^3 volume. There is Fat Layer (Subcutaneous Tissue)  exposed. There is no tunneling or undermining noted. There is a medium amount of serosanguineous drainage noted. The wound margin is distinct with the outline attached to the wound base. There is large (67-100%) red granulation within the wound bed. There is no necrotic tissue within the wound bed. The periwound skin appearance had no abnormalities noted for texture. The periwound skin appearance had no abnormalities noted for moisture. The periwound skin appearance had no abnormalities noted for color. Periwound temperature was noted as No Abnormality. The periwound has tenderness on palpation. 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 Chronic obstructive pulmonary disease, unspecified Chronic combined systolic (congestive) and diastolic (congestive) heart failure Essential (primary) hypertension Secondary hyperparathyroidism of renal origin End stage renal disease Human immunodeficiency virus [HIV] disease Other chronic osteomyelitis, right ankle and foot Plan Follow-up Appointments: Return Appointment in 1 week. - Dr. Celine Ahr RM 1 with [name] Anesthetic: Wound #8 Right,Plantar Foot: (In clinic) Topical Lidocaine 4% applied to wound bed Bathing/ Shower/ Hygiene: Other Bathing/Shower/Hygiene Orders/Instructions: - Change dressing after bathing Off-Loading: Other: - minimal weight bearing right foot Home Health: Admit to [facility] for wound care. May utilize formulary equivalent dressing for wound treatment orders unless otherwise specified. - pt has dialysis MWF mornings WOUND #8: - Foot Wound Laterality: Plantar, Right Cleanser: Soap and Water 3 x Per Week/30 Days Discharge Instructions: May shower and wash wound with dial antibacterial soap and water prior to dressing change. Cleanser: Wound Cleanser (Generic) 3 x Per Week/30 Days Discharge  Instructions: Cleanse the wound with wound cleanser prior to applying a clean dressing using gauze sponges, not tissue or cotton balls. Prim Dressing: Iodoform packing strip 1/2 (in) 3 x Per Week/30 Days ary Discharge Instructions: Lightly pack as instructed Secondary Dressing: Optifoam Non-Adhesive Dressing, 4x4 in 3 x Per Week/30 Days Discharge Instructions: Apply over primary dressing cut to make foam donut Secondary Dressing: Woven Gauze  Sponges 2x2 in 3 x Per Week/30 Days Discharge Instructions: Apply over primary dressing as directed. Secured With: Elastic Bandage 4 inch (ACE bandage) 3 x Per Week/30 Days Discharge Instructions: Secure with ACE bandage as directed. Secured With: 32M Medipore H Soft Cloth Surgical T ape, 4 x 10 (in/yd) 3 x Per Week/30 Days Discharge Instructions: Secure with tape as directed. WOUND #9: - Foot Wound Laterality: Dorsal, Right Prim Dressing: Gauze Non-Woven Sterile Sponge 4x4 (in/in) 3 x Per Week/30 Days ary Secured With: Kerlix Roll Sterile, 4.5x3.1 (in/yd) 3 x Per Week/30 Days Discharge Instructions: Secure with Kerlix as directed. 07/26/2022: The wound is very clean today. There is no slough buildup. No purulent drainage. It does probe through to the dorsum of his foot to his surgical incision. Sutures are still in place. No debridement was necessary today. Due to the narrowness of the tunnel, we are going to pack it with quarter inch iodoform packing strips. He has requested home health so we will work on arranging this for him. He will continue IV antibiotics per infectious disease. I left his sutures in place, deferring management of the surgical site to podiatry. We will follow-up. 1 week. Electronic Signature(s) Signed: 07/26/2022 10:16:01 AM By: Fredirick Maudlin MD FACS Signed: 07/26/2022 5:08:49 PM By: Baruch Gouty RN, BSN Signed: 07/26/2022 5:08:49 PM By: Baruch Gouty RN, BSN Previous Signature: 07/26/2022 8:42:46 AM Version By: Fredirick Maudlin MD FACS Entered By: Baruch Gouty on 07/26/2022 08:53:37 -------------------------------------------------------------------------------- HxROS Details Patient Name: Date of Service: Jacob [name], Jacob [name] 07/26/2022 8:15 A M Medical Record Number: [id number] Patient Account Number: [id number] Date of Birth/Sex: Treating RN: [birth year] (71 y.o. M) Primary Care Provider: Alain Honey Other Clinician: Referring Provider: Treating Provider/Extender: Neil Crouch in Treatment: 21 Information Obtained From Patient Constitutional Symptoms (General Health) Medical History: Past Medical History Notes: Insomnia Syncope Alcohol Dependence HIV Eyes Medical History: Positive for: Cataracts - mild; Glaucoma Negative for: Optic Neuritis Ear/Nose/Mouth/Throat Medical History: Negative for: Chronic sinus problems/congestion; Middle ear problems Hematologic/Lymphatic Medical History: Positive for: Anemia Negative for: Hemophilia; Human Immunodeficiency Virus; Lymphedema; Sickle Cell Disease Past Medical History Notes: Hyperlipidemia Hyperglycemia Respiratory Medical History: Positive for: Chronic Obstructive Pulmonary Disease (COPD) Negative for: Aspiration; Asthma; Pneumothorax; Sleep Apnea; Tuberculosis Cardiovascular Medical History: Positive for: Congestive Heart Failure; Coronary Artery Disease; Hypertension; Myocardial Infarction - 2004 Negative for: Angina; Arrhythmia; Deep Vein Thrombosis; Hypotension; Peripheral Arterial Disease; Peripheral Venous Disease; Phlebitis; Vasculitis Past Medical History Notes: CABG, ischemic cardiomyopathy Gastrointestinal Medical History: Positive for: Hepatitis C - 1968 Negative for: Cirrhosis ; Colitis; Crohns; Hepatitis A; Hepatitis B Past Medical History Notes: GERD Endocrine Medical History: Positive for: Type II Diabetes Negative for: Type I Diabetes Time with diabetes: 13 yrs Treated with:  Insulin Blood sugar tested every day: Yes Tested : 2 times per day Blood sugar testing results: Breakfast: 100; Dinner: 95-126 Genitourinary Medical History: Negative for: End Stage Renal Disease Past Medical History Notes: Syphilis CKD,Nocturia Immunological Medical History: Negative for: Lupus Erythematosus; Raynauds; Scleroderma Past Medical History Notes: HIV Integumentary (Skin) Medical History: Negative for: History of Burn Musculoskeletal Medical History: Positive for: Gout; Osteoarthritis Negative for: Rheumatoid Arthritis; Osteomyelitis Past Medical History Notes: Hip Fx BiLateral Bunions Unspecified Arthritis Neurologic Medical History: Positive for: Neuropathy Negative for: Dementia; Quadriplegia; Paraplegia; Seizure Disorder Past Medical History Notes: chronic back pain Oncologic Medical History: Negative for: Received Chemotherapy; Received Radiation Psychiatric Medical History: Negative for: Anorexia/bulimia; Confinement Anxiety HBO Extended History Items Eyes: Eyes: Cataracts Glaucoma Immunizations Pneumococcal  Vaccine: Received Pneumococcal Vaccination: Yes Received Pneumococcal Vaccination On or After 60th Birthday: Yes Implantable Devices No devices added Hospitalization / Surgery History Type of Hospitalization/Surgery Colonoscopy CABG Laproscopic Cholecystectomy Left Hip Fx Left Jaw Fx Lumbar Laminectomy Family and Social History Cancer: No; Diabetes: Yes - Siblings,Father; Heart Disease: Yes - Father,Siblings; Hereditary Spherocytosis: No; Hypertension: Yes - Father,Siblings; Kidney Disease: No; Lung Disease: No; Seizures: No; Stroke: No; Thyroid Problems: No; Tuberculosis: No; Current every day smoker - 1/2 ppd / 38yr; Marital Status - Single; Alcohol Use: Moderate - 6 standard drinks per week; Drug Use: Prior History - Polysubstance Abuse; clean for 9 years; Caffeine Use: Rarely - tea; Financial Concerns: No; Food, Clothing or  Shelter Needs: No; Support System Lacking: No; Transportation Concerns: No Electronic Signature(s) Signed: 07/26/2022 8:43:42 AM By: CFredirick MaudlinMD FACS Entered By: CFredirick Maudlinon 07/26/2022 08:41:04 -------------------------------------------------------------------------------- SuperBill Details Patient Name: Date of Service: NRaechel Ache10/12/2021 Medical Record Number: [id number] Account Number: [id number] of Birth/Sex: Treating RN: [id number](71y.o. M) Primary Care Provider: MAlain HoneyOther Clinician: Referring Provider: Treating Provider/Extender: CNeil Crouchin Treatment: 21 Diagnosis Coding ICD-10 Codes Code Description L862 [id number]-pressure chronic ulcer of other part of right foot with fat layer exposed E11.22 Type 2 diabetes mellitus with diabetic chronic kidney disease J44.9 Chronic obstructive pulmonary disease, unspecified I50.42 Chronic combined systolic (congestive) and diastolic (congestive) heart failure I10 Essential (primary) hypertension N25.81 Secondary hyperparathyroidism of renal origin N18.6 End stage renal disease B20 Human immunodeficiency virus [HIV] disease M86.671 Other chronic osteomyelitis, right ankle and foot Facility Procedures CPT4 Code: [id number]: 99214 - WOUND CARE VISIT-LEV 4 EST PT Modifier: Quantity: 1 Physician Procedures : CPT4 Code Description Modifier [id number] 41740- WC PHYS LEVEL 4 - EST PT ICD-10 Diagnosis Description L97.512 Non-pressure chronic ulcer of other part of right foot with fat layer exposed M86.671 Other chronic osteomyelitis, right ankle and foot I50.42  Chronic combined systolic (congestive) and diastolic (congestive) heart failure B20 Human immunodeficiency virus [HIV] disease Quantity: 1 Electronic Signature(s) Signed: 07/26/2022 10:16:01 AM By: CFredirick MaudlinMD FACS Signed: 07/26/2022 5:08:49 PM By: BBaruch GoutyRN, BSN Previous  Signature: 07/26/2022 8:43:09 AM Version By: CFredirick MaudlinMD FACS Entered By: BBaruch Goutyon 07/26/2022 08:53:22

## 2022-07-26 NOTE — Telephone Encounter (Signed)
Patient called again requesting refill of oxycodone. Patient states that he is out of medication. Looks like Jacob [name], [place] sent message 07/25/22 and pended medication to be filled. Please send medication to pharmacy  Message routed to Alain Honey, MD

## 2022-07-26 NOTE — Telephone Encounter (Signed)
This encounter was created in error - please disregard.

## 2022-07-27 DIAGNOSIS — N2581 Secondary hyperparathyroidism of renal origin: Secondary | ICD-10-CM | POA: Diagnosis not present

## 2022-07-27 DIAGNOSIS — D631 Anemia in chronic kidney disease: Secondary | ICD-10-CM | POA: Diagnosis not present

## 2022-07-27 DIAGNOSIS — Z992 Dependence on renal dialysis: Secondary | ICD-10-CM | POA: Diagnosis not present

## 2022-07-27 DIAGNOSIS — N186 End stage renal disease: Secondary | ICD-10-CM | POA: Diagnosis not present

## 2022-07-27 DIAGNOSIS — M868X8 Other osteomyelitis, other site: Secondary | ICD-10-CM | POA: Diagnosis not present

## 2022-07-27 DIAGNOSIS — E1122 Type 2 diabetes mellitus with diabetic chronic kidney disease: Secondary | ICD-10-CM | POA: Diagnosis not present

## 2022-07-27 DIAGNOSIS — D649 Anemia, unspecified: Secondary | ICD-10-CM | POA: Diagnosis not present

## 2022-07-28 ENCOUNTER — Ambulatory Visit (INDEPENDENT_AMBULATORY_CARE_PROVIDER_SITE_OTHER): Payer: Medicare Other | Admitting: Podiatry

## 2022-07-28 ENCOUNTER — Encounter: Payer: Self-pay | Admitting: Internal Medicine

## 2022-07-28 DIAGNOSIS — E11621 Type 2 diabetes mellitus with foot ulcer: Secondary | ICD-10-CM

## 2022-07-28 DIAGNOSIS — M86471 Chronic osteomyelitis with draining sinus, right ankle and foot: Secondary | ICD-10-CM

## 2022-07-28 DIAGNOSIS — E0842 Diabetes mellitus due to underlying condition with diabetic polyneuropathy: Secondary | ICD-10-CM

## 2022-07-28 DIAGNOSIS — L97514 Non-pressure chronic ulcer of other part of right foot with necrosis of bone: Secondary | ICD-10-CM

## 2022-07-28 DIAGNOSIS — L97414 Non-pressure chronic ulcer of right heel and midfoot with necrosis of bone: Secondary | ICD-10-CM

## 2022-07-28 NOTE — Progress Notes (Signed)
Subjective:  [name], male    DOB: September 16, 1951,  MRN: [id number]  Chief Complaint  Patient presents with   Routine Post Op    Rm 16 POV#1 / DOS 07/20/22 -Irragation and debridement of right foot wound with extraction of all necrotic soft tissue and bone. Pt states he is doing well. NO NV, fever or chills.     DOS: July 20, 2022 Procedure: Right foot resection of fourth metatarsal head and base of proximal phalanx of the fourth toe, irrigation and debridement of plantar soft tissue ulceration  71 y.o. male returns for post-op check.  He was recently admitted for right foot wound infection concern for osteomyelitis.  Was found to have significant chronic osteomyelitis of the fourth metatarsal head as well as the base of the proximal phalanx.  Plantar ulceration subfourth metatarsal head right foot.  He has been seeing wound care previously but was sent to the hospital by wound care.  Unclear why it took so long to refer the patient to the hospital with significant chronic osteomyelitis that was likely present for weeks to months prior to his admission.  Review of Systems: Negative except as noted in the HPI. Denies N/V/F/Ch.   Objective:  There were no vitals filed for this visit. There is no height or weight on file to calculate BMI. Constitutional Well developed. Well nourished.  Vascular Foot warm and well perfused. Capillary refill normal to all digits.  Calf is soft and supple, no posterior calf or knee pain, negative Homans' sign  Neurologic Normal speech. Oriented to person, place, and time. Epicritic sensation to light touch grossly present bilaterally.  Dermatologic Skin healing well without signs of infection. Skin edges well coapted without signs of infection.  Central aspect of the incision does have a small area of very mild dehiscence however sutures are still intact and there is not appear to be any significant gapping of the incision line.  No  maceration or purulence able to be expressed dorsally or plantarly.  Plantarly the wound is filling in with fibrotic tissue.  Packing material was present and changed.       Orthopedic: Tenderness to palpation noted about the surgical site.   Multiple view plain film radiographs: Deferred at this visit Assessment:   1. Chronic osteomyelitis of right foot with draining sinus (HCC)   2. Ulcer of right foot, with necrosis of bone ([place])   3. Diabetic ulcer of right midfoot associated with type 2 diabetes mellitus, with necrosis of bone ([place])   4. Diabetes mellitus due to underlying condition with diabetic polyneuropathy, unspecified whether long term insulin use ([place])    Plan:  Patient was evaluated and treated and all questions answered.  S/p foot surgery right fourth MPJ resection with closure of the dorsal incision and irrigation debridement of plantar right foot wound currently being packed with iodoform gauze. -Of note patient had severe necrotic changes related to the fourth metatarsal head during the debridement it was evident that this was advanced osteomyelitis that had been present for a significant amount of time and was contributing to the nonhealing plantar fourth met head wound that had developed. -Progressing as expected post-operatively.  No evidence of significant distal dehiscence or complication at the dorsal incision.  Plantar wound appears to be improving after resection of chronic osteomyelitic bone. -XR: Deferred at this visit we will take additional x-rays at next visit -WB Status: Weightbearing as tolerated in postop shoe -Sutures: To remain intact. -Medications: Patient has  pain medications from primary care continue antibiotics during dialysis sessions which is currently the plan per infectious disease -Foot redressed.  Applied Betadine wet to dry gauze.  Applied iodoform packing strip to the plantar ulceration. -Patient will continue to follow-up with wound care  center for at least once weekly dressing changes.  I recommend he have a home care nurse come out to his house for second dressing change each week.  He is now agreeable to this he was not previously while he was in the hospital.  I will see him back in 2 weeks for additional wound check and possible removal of the sutures at that time.  Will need continued long-term wound care per the wound care center hopefully will be able to heal his plantar forefoot ulceration now that the infected bone has been removed.  Return in about 2 weeks (around 08/11/2022) for 2nd POV R foot 4th met head resection.         Everitt Amber, DPM [facility]

## 2022-07-29 DIAGNOSIS — D649 Anemia, unspecified: Secondary | ICD-10-CM | POA: Diagnosis not present

## 2022-07-29 DIAGNOSIS — Z992 Dependence on renal dialysis: Secondary | ICD-10-CM | POA: Diagnosis not present

## 2022-07-29 DIAGNOSIS — D631 Anemia in chronic kidney disease: Secondary | ICD-10-CM | POA: Diagnosis not present

## 2022-07-29 DIAGNOSIS — M868X8 Other osteomyelitis, other site: Secondary | ICD-10-CM | POA: Diagnosis not present

## 2022-07-29 DIAGNOSIS — N2581 Secondary hyperparathyroidism of renal origin: Secondary | ICD-10-CM | POA: Diagnosis not present

## 2022-07-29 DIAGNOSIS — E1122 Type 2 diabetes mellitus with diabetic chronic kidney disease: Secondary | ICD-10-CM | POA: Diagnosis not present

## 2022-07-29 DIAGNOSIS — N186 End stage renal disease: Secondary | ICD-10-CM | POA: Diagnosis not present

## 2022-08-01 DIAGNOSIS — N2581 Secondary hyperparathyroidism of renal origin: Secondary | ICD-10-CM | POA: Diagnosis not present

## 2022-08-01 DIAGNOSIS — E1122 Type 2 diabetes mellitus with diabetic chronic kidney disease: Secondary | ICD-10-CM | POA: Diagnosis not present

## 2022-08-01 DIAGNOSIS — N186 End stage renal disease: Secondary | ICD-10-CM | POA: Diagnosis not present

## 2022-08-01 DIAGNOSIS — Z992 Dependence on renal dialysis: Secondary | ICD-10-CM | POA: Diagnosis not present

## 2022-08-01 DIAGNOSIS — M868X8 Other osteomyelitis, other site: Secondary | ICD-10-CM | POA: Diagnosis not present

## 2022-08-01 DIAGNOSIS — D649 Anemia, unspecified: Secondary | ICD-10-CM | POA: Diagnosis not present

## 2022-08-01 DIAGNOSIS — D631 Anemia in chronic kidney disease: Secondary | ICD-10-CM | POA: Diagnosis not present

## 2022-08-02 ENCOUNTER — Ambulatory Visit (INDEPENDENT_AMBULATORY_CARE_PROVIDER_SITE_OTHER): Payer: Medicare Other | Admitting: Family

## 2022-08-02 ENCOUNTER — Encounter: Payer: Self-pay | Admitting: Family

## 2022-08-02 VITALS — BP 136/60 | HR 70 | Temp 97.0°F | Resp 16 | Ht 69.0 in | Wt 159.2 lb

## 2022-08-02 DIAGNOSIS — M869 Osteomyelitis, unspecified: Secondary | ICD-10-CM

## 2022-08-02 DIAGNOSIS — E1169 Type 2 diabetes mellitus with other specified complication: Secondary | ICD-10-CM

## 2022-08-02 DIAGNOSIS — I10 Essential (primary) hypertension: Secondary | ICD-10-CM

## 2022-08-02 DIAGNOSIS — N186 End stage renal disease: Secondary | ICD-10-CM

## 2022-08-02 DIAGNOSIS — E785 Hyperlipidemia, unspecified: Secondary | ICD-10-CM | POA: Diagnosis not present

## 2022-08-02 DIAGNOSIS — Z992 Dependence on renal dialysis: Secondary | ICD-10-CM | POA: Diagnosis not present

## 2022-08-02 NOTE — Patient Instructions (Signed)
-   follow up with wound care center as scheduled  - continue with [facility]

## 2022-08-02 NOTE — Progress Notes (Signed)
Provider: Ellene Bloodsaw FNP-C  [name], MD  Patient Care Team: [name], MD as PCP - General (Family Medicine) [name], MD as PCP - Infectious Diseases (Infectious Diseases) [name], MD as PCP - Cardiology (Cardiology) [name], MD as Consulting Physician (Ophthalmology) [name], MD as Consulting Physician (Cardiology) [name], MD as Consulting Physician (Infectious Diseases) Gardiner Barefoot, DPM as Consulting Physician (Podiatry) Daley Mooradian, Nelda Bucks, NP as Nurse Practitioner (Family Medicine) [place], [facility]  Extended Emergency Contact Information Primary Emergency Contact: [place] Phone: [phone] Mobile Phone: [phone] Relation: Sister Secondary Emergency Contact: Alda Berthold [place] Phone: [phone] Mobile Phone: [phone] Relation: Sister  Code Status:  Full Code  Goals of care: Advanced Directive information    08/02/2022   10:41 AM  Advanced Directives  Does Patient Have a Medical Advance Directive? Yes  Type of Paramedic of [place];Living will;Out of facility DNR (pink MOST or yellow form)  Does patient want to make changes to medical advance directive? No - Patient declined  Copy of [place] in Chart? No - copy requested     Chief Complaint  Patient presents with   Transitions Of Care    [facility] 07/18/2022-07/22/2022   Home Care    Patient wants to discuss home health.     HPI:  Pt is a 71 y.o. male seen today for an acute visit for transition of care posthospitalization from 07/18/2022 -07/22/2022 for right foot osteomyelitis of the distal fourth metatarsal and base of the proximal right phalanges of the right foot.  He was seen by podiatrist underwent resection and fourth proximal phalanges partial resection of the right foot.  Irrigation and debridement was done by Dr.  Carmina Miller on 07/20/2022.  Infectious disease recommended vancomycin plus ceftaz with dialysis 3 times weekly for 6 weeks patient was adamant on being discharged home infectious disease will follow-up with cultures on an outpatient then adjust antibiotics if necessary.  Has appointment with infectious disease 08/04/2022 at 9 AM with Dr. Bridget Hartshorn he was evaluated by PT OT did not recommend any home health or SNF. Hemoglobin was 10.3    He was also found to have hyperkalemia nephrologist was consulted continue hemodialysis on Monday Wednesday and Friday. Type 2 diabetes mellitus -no home CBG for review last hemoglobin A1c done 03/22/2022 was 5.9 he denies any symptoms of hypoglycemia.  He denies any acute issues this visit but requests home health nurse to assist with right foot dressing changes.  State has been able to wrap the leg but having difficulty.  He denies any signs of infection.   Past Medical History:  Diagnosis Date   Acute respiratory failure ([place]) 03/01/2018   Anemia    Arthritis    "all over; mostly knees and back" (02/28/2018)   Chronic combined systolic and diastolic CHF (congestive heart failure) (HCC)    Chronic lower back pain    stenosis   Community acquired pneumonia 09/06/2013   COPD (chronic obstructive pulmonary disease) ([place])    Coronary atherosclerosis of native coronary artery    a. 02/2003 s/p CABG x 2 (VG->RI, VG->RPDA; b. 11/2019 PCI: LM nl, LAD 90d, D3 50, RI 100, LCX 100p, OM3 100 - fills via L->L collats from D2/dLAD, RCA 100p, VG->RPDA ok, VG->RI 95 (3.5x48 Synergy XD DES).   Drug abuse ([place])    hx; tested for cocaine as recently as 2/08. says he is not  using drugs now - avoided defib. for this reason    ESRD (end stage renal disease) ([place])    Hemo M-W-F- [facility]   Fall at home 10/2020   GERD (gastroesophageal reflux disease)    takes OTC meds as needed   GI bleeding    a. 11/2019 EGD: angiodysplastic lesions w/ bleeding s/p argon plasma/clipping/epi inj.  Multiple admissions for the same.   Glaucoma    uses eye drops daily   Hepatitis B 1968   "tx'd w/isolation; caught it from toilet stools in gym"   History of blood transfusion 03/01/2019   History of colon polyps    benign   History of gout    takes Allopurinol daily as well as Colchicine-if needed (02/28/2018)   History of kidney stones    HTN (hypertension)    takes Coreg,Imdur.and Apresoline daily   Human immunodeficiency virus (HIV) disease ([place]) dx'd 1995   on [place] as of 12/2020.     Hyperlipidemia    Ischemic cardiomyopathy    a. 01/2019 Echo: EF 40-45%, diffuse HK, mild basal septal hypertrophy. Diast dysfxn. Nl RV size/fxn. Sev dil LA. Triv MR/TR/PR.   Muscle spasm    takes Zanaflex as needed   Myocardial infarction ([place]) ~ 2004/2005   Nocturia    Peripheral neuropathy    takes gabapentin daily   Pneumonia    "at least twice" (02/28/2018)   SDH (subdural hematoma) (HCC)    Syphilis, unspecified    Type II diabetes mellitus ([place]) 2004   Lantus daily.Average fasting blood sugar 125-199   Wears glasses    Wears partial dentures    Past Surgical History:  Procedure Laterality Date   AV FISTULA PLACEMENT Left 08/02/2018   Procedure: ARTERIOVENOUS (AV) FISTULA CREATION  left arm radiocephlic;  Surgeon: Marty Heck, MD;  Location: [place];  Service: Vascular;  Laterality: Left;   AV FISTULA PLACEMENT Left 08/01/2019   Procedure: LEFT BRACHIOCEPHALIC ARTERIOVENOUS (AV) FISTULA CREATION;  Surgeon: [name], MD;  Location: [place];  Service: Vascular;  Laterality: Left;   AV FISTULA PLACEMENT Right 04/12/2022   Procedure: RIGHT UPPER EXTREMITY ARTERIOVENOUS (AV)  GRAFT INSERTION USING GORE STRETCH 4-7 MM;  Surgeon: Angelia Mould, MD;  Location: [place];  Service: Vascular;  Laterality: Right;   [place] Left 10/03/2019   Procedure: BASILIC VEIN TRANSPOSITION LEFT SECOND STAGE;  Surgeon: [name], MD;  Location: [place];  Service: Vascular;   Laterality: Left;   BIOPSY  01/25/2021   Procedure: BIOPSY;  Surgeon: Doran Stabler, MD;  Location: [facility] ENDOSCOPY;  Service: Gastroenterology;;   CARDIAC CATHETERIZATION  10/2002; 12/19/2004   [name] 03/08/2011   COLONOSCOPY  2013   [place]    CORONARY ARTERY BYPASS GRAFT  02/24/2003   CABG X2/notes 03/08/2011   CORONARY STENT INTERVENTION N/A 12/19/2019   Procedure: CORONARY STENT INTERVENTION;  Surgeon: Jettie Booze, MD;  Location: [place] CV LAB;  Service: Cardiovascular;  Laterality: N/A;   ENTEROSCOPY N/A 01/25/2021   Procedure: ENTEROSCOPY;  Surgeon: Doran Stabler, MD;  Location: [place];  Service: Gastroenterology;  Laterality: N/A;   ENTEROSCOPY N/A 02/13/2021   Procedure: ENTEROSCOPY;  Surgeon: Jackquline Denmark, MD;  Location: [facility] ENDOSCOPY;  Service: Endoscopy;  Laterality: N/A;   ENTEROSCOPY N/A 05/07/2021   Procedure: ENTEROSCOPY;  Surgeon: Yetta Flock, MD;  Location: [facility] ENDOSCOPY;  Service: Gastroenterology;  Laterality: N/A;   ESOPHAGOGASTRODUODENOSCOPY (EGD) WITH PROPOFOL N/A 02/08/2019   Procedure:  ESOPHAGOGASTRODUODENOSCOPY (EGD) WITH PROPOFOL;  Surgeon: Milus Banister, MD;  Location: [facility] ENDOSCOPY;  Service: Gastroenterology;  Laterality: N/A;   ESOPHAGOGASTRODUODENOSCOPY (EGD) WITH PROPOFOL N/A 12/22/2019   Procedure: ESOPHAGOGASTRODUODENOSCOPY (EGD) WITH PROPOFOL;  Surgeon: Lavena Bullion, DO;  Location: [place];  Service: Gastroenterology;  Laterality: N/A;   ESOPHAGOGASTRODUODENOSCOPY (EGD) WITH PROPOFOL N/A 10/19/2020   Procedure: ESOPHAGOGASTRODUODENOSCOPY (EGD) WITH PROPOFOL;  Surgeon: Jackquline Denmark, MD;  Location: [facility] ENDOSCOPY;  Service: Endoscopy;  Laterality: N/A;   ESOPHAGOGASTRODUODENOSCOPY (EGD) WITH PROPOFOL N/A 12/22/2020   Procedure: ESOPHAGOGASTRODUODENOSCOPY (EGD) WITH PROPOFOL;  Surgeon: Gatha Mayer, MD;  Location: [place];  Service: Endoscopy;  Laterality: N/A;   ESOPHAGOGASTRODUODENOSCOPY (EGD) WITH PROPOFOL N/A  01/09/2021   Procedure: ESOPHAGOGASTRODUODENOSCOPY (EGD) WITH PROPOFOL;  Surgeon: Irene Shipper, MD;  Location: [facility] ENDOSCOPY;  Service: Endoscopy;  Laterality: N/A;   HEMOSTASIS CLIP PLACEMENT  12/22/2019   Procedure: HEMOSTASIS CLIP PLACEMENT;  Surgeon: Lavena Bullion, DO;  Location: [place] ENDOSCOPY;  Service: Gastroenterology;;   HEMOSTASIS CLIP PLACEMENT  12/22/2020   Procedure: HEMOSTASIS CLIP PLACEMENT;  Surgeon: Gatha Mayer, MD;  Location: [facility] ENDOSCOPY;  Service: Endoscopy;;   HEMOSTASIS CONTROL  12/22/2020   Procedure: HEMOSTASIS CONTROL/hemospray;  Surgeon: Gatha Mayer, MD;  Location: [place];  Service: Endoscopy;;   HOT HEMOSTASIS N/A 02/08/2019   Procedure: HOT HEMOSTASIS (ARGON PLASMA COAGULATION/BICAP);  Surgeon: Milus Banister, MD;  Location: [facility] ENDOSCOPY;  Service: Gastroenterology;  Laterality: N/A;   HOT HEMOSTASIS N/A 12/22/2019   Procedure: HOT HEMOSTASIS (ARGON PLASMA COAGULATION/BICAP);  Surgeon: Lavena Bullion, DO;  Location: [facility] ENDOSCOPY;  Service: Gastroenterology;  Laterality: N/A;   HOT HEMOSTASIS N/A 10/19/2020   Procedure: HOT HEMOSTASIS (ARGON PLASMA COAGULATION/BICAP);  Surgeon: Jackquline Denmark, MD;  Location: [facility] ENDOSCOPY;  Service: Endoscopy;  Laterality: N/A;   HOT HEMOSTASIS N/A 12/22/2020   Procedure: HOT HEMOSTASIS (ARGON PLASMA COAGULATION/BICAP);  Surgeon: Gatha Mayer, MD;  Location: [facility] ENDOSCOPY;  Service: Endoscopy;  Laterality: N/A;   HOT HEMOSTASIS N/A 01/09/2021   Procedure: HOT HEMOSTASIS (ARGON PLASMA COAGULATION/BICAP);  Surgeon: Irene Shipper, MD;  Location: [facility] ENDOSCOPY;  Service: Endoscopy;  Laterality: N/A;   HOT HEMOSTASIS N/A 01/25/2021   Procedure: HOT HEMOSTASIS (ARGON PLASMA COAGULATION/BICAP);  Surgeon: Doran Stabler, MD;  Location: [place];  Service: Gastroenterology;  Laterality: N/A;   HOT HEMOSTASIS N/A 02/13/2021   Procedure: HOT HEMOSTASIS (ARGON PLASMA COAGULATION/BICAP);  Surgeon: Jackquline Denmark, MD;  Location: [facility]  ENDOSCOPY;  Service: Endoscopy;  Laterality: N/A;   HOT HEMOSTASIS N/A 05/07/2021   Procedure: HOT HEMOSTASIS (ARGON PLASMA COAGULATION/BICAP);  Surgeon: Yetta Flock, MD;  Location: [facility] ENDOSCOPY;  Service: Gastroenterology;  Laterality: N/A;   INSERTION OF DIALYSIS CATHETER N/A 02/08/2022   Procedure: INSERTION OF TUNNELED DIALYSIS CATHETER;  Surgeon: Angelia Mould, MD;  Location: [place];  Service: Vascular;  Laterality: N/A;   INTERTROCHANTERIC HIP FRACTURE SURGERY Left 11/2006   [name] 03/08/2011   INTRAVASCULAR ULTRASOUND/IVUS N/A 12/19/2019   Procedure: Intravascular Ultrasound/IVUS;  Surgeon: Jettie Booze, MD;  Location: [place] CV LAB;  Service: Cardiovascular;  Laterality: N/A;   IR FLUORO GUIDE CV LINE LEFT  03/14/2022   IR FLUORO GUIDE CV LINE RIGHT  07/24/2019   IR FLUORO GUIDE CV LINE RIGHT  07/30/2019   IR US GUIDE VASC ACCESS RIGHT  07/24/2019   IR US GUIDE VASC ACCESS RIGHT  07/30/2019   LAPAROSCOPIC CHOLECYSTECTOMY  05/2006   LIGATION OF ARTERIOVENOUS  FISTULA Left 02/08/2022  Procedure: LIGATION OF LEFT ARTERIOVENOUS  FISTULA;  Surgeon: Angelia Mould, MD;  Location: [facility];  Service: Vascular;  Laterality: Left;   LIGATION OF COMPETING BRANCHES OF ARTERIOVENOUS FISTULA Left 11/05/2018   Procedure: LIGATION OF COMPETING BRANCHES OF ARTERIOVENOUS FISTULA  LEFT  ARM;  Surgeon: Marty Heck, MD;  Location: [place];  Service: Vascular;  Laterality: Left;   LUMBAR LAMINECTOMY/DECOMPRESSION MICRODISCECTOMY N/A 02/29/2016   Procedure: Left L4-5 Lateral Recess Decompression, Removal Extradural Intraspinal Facet Cyst;  Surgeon: Marybelle Killings, MD;  Location: [place];  Service: Orthopedics;  Laterality: N/A;   METATARSAL HEAD EXCISION Right 07/20/2022   Procedure: Irragation and debridement of right foot wound with extraction of all necrotic soft tissue and bone;  Surgeon: Yevonne Pax, DPM;  Location: [place];  Service: Podiatry;  Laterality: Right;   Right 4th Met head and base of proximal phalanx resection   MULTIPLE TOOTH EXTRACTIONS     ORIF MANDIBULAR FRACTURE Left 08/13/2004   ORIF of left body fracture mandible with KLS Martin 2.3-mm six hole/notes 03/08/2011   RIGHT/LEFT HEART CATH AND CORONARY/GRAFT ANGIOGRAPHY N/A 12/19/2019   Procedure: RIGHT/LEFT HEART CATH AND CORONARY/GRAFT ANGIOGRAPHY;  Surgeon: Jettie Booze, MD;  Location: [place] CV LAB;  Service: Cardiovascular;  Laterality: N/A;   SCLEROTHERAPY  12/22/2019   Procedure: SCLEROTHERAPY;  Surgeon: Lavena Bullion, DO;  Location: [place] ENDOSCOPY;  Service: Gastroenterology;;   Clide Deutscher  02/13/2021   Procedure: Clide Deutscher;  Surgeon: Jackquline Denmark, MD;  Location: [facility] ENDOSCOPY;  Service: Endoscopy;;    Allergies  Allergen Reactions   Augmentin [Amoxicillin-Pot Clavulanate] Diarrhea and Other (See Comments)    Severe diarrhea   Morphine Sulfate Anaphylaxis    Can't mix with oxycodone    Mucinex Fast-Max Other (See Comments)    Intense sweating    Amphetamines Other (See Comments)    Unknown reaction    Outpatient Encounter Medications as of 08/02/2022  Medication Sig   acetaminophen (TYLENOL) 650 MG CR tablet Take 1,300 mg by mouth 3 (three) times daily.   allopurinol (ZYLOPRIM) 100 MG tablet Take one tablet by mouth once daily.   atorvastatin (LIPITOR) 10 MG tablet TAKE 1 TABLET(10 MG) BY MOUTH DAILY   B-D UF III MINI PEN NEEDLES 31G X 5 MM MISC USE 4 TIMES DAILY   BIKTARVY 50-200-25 MG TABS tablet Take 1 tablet by mouth daily.   Calcium Acetate 667 MG TABS Take 667-2,001 mg by mouth See admin instructions. Take 2001 mg by mouth three times a day with meals and 667 mg with each snack   carvedilol (COREG) 3.125 MG tablet Take 1 tablet (3.125 mg total) by mouth 2 (two) times daily with a meal. CALL AND SCHEDULE 1 YEAR FOLLOW UP OFFICE VISIT TO RECEIVE FURTHER REFILLS. [phone].   cefTAZidime 2 g in sodium chloride 0.9 % 100 mL Inject 2 g into the vein  every Monday, Wednesday, and Friday at 8 PM.   diclofenac Sodium (VOLTAREN) 1 % GEL APPLY 2 GRAMS TOPICALLY TO THE AFFECTED AREA THREE TIMES DAILY AS NEEDED FOR PAIN   docusate sodium (COLACE) 100 MG capsule Take 100 mg by mouth daily as needed for mild constipation.   folic acid (FOLVITE) 1 MG tablet TAKE 1 TABLET(1 MG) BY MOUTH DAILY   gabapentin (NEURONTIN) 300 MG capsule TAKE 1 CAPSULE BY MOUTH DAILY.   gentamicin ointment (GARAMYCIN) 0.1 % Apply 1 Application topically daily.   glucose blood (ONETOUCH ULTRA) test strip Check blood sugar three times daily  E11.40   insulin lispro (HUMALOG) 100 UNIT/ML KwikPen INJECT 20 UNITS UNDER THE SKIN THREE TIMES DAILY AS NEEDED FOR HIGH BLOOD SUGAR(ABOVE 150)   isosorbide mononitrate (IMDUR) 30 MG 24 hr tablet Take 1 tablet (30 mg total) by mouth daily.   Lancets (ONETOUCH DELICA PLUS [id number]) MISC 1 Device by Other route 3 (three) times daily. E11.42   latanoprost (XALATAN) 0.005 % ophthalmic solution Place 1 drop into both eyes at bedtime.   lidocaine-prilocaine (EMLA) cream Apply 1 application topically every Monday, Wednesday, and Friday with hemodialysis.   Menthol-Camphor (TIGER BALM ARTHRITIS RUB) 11-11 % CREA Apply 1 Application topically daily as needed (pain).   multivitamin (RENA-VIT) TABS tablet Take 1 tablet by mouth daily.   nitroGLYCERIN (NITROSTAT) 0.3 MG SL tablet PLACE 1 TABLET UNDER THE TONGUE AS NEEDED FOR CHEST PAIN   octreotide (SANDOSTATIN LAR DEPOT) 10 MG injection Inject 10 mg into the muscle every 28 days.   omeprazole (PRILOSEC) 40 MG capsule Take 1 capsule (40 mg total) by mouth 2 (two) times daily for 28 days.   ondansetron (ZOFRAN) 4 MG tablet TAKE 1 TABLET(4 MG) BY MOUTH EVERY 8 HOURS AS NEEDED FOR NAUSEA OR VOMITING   oxyCODONE-acetaminophen (PERCOCET) 5-325 MG tablet Take 1 tablet by mouth every 6 (six) hours as needed for severe pain.   polyethylene glycol (MIRALAX / GLYCOLAX) 17 g packet Take 17 g by mouth daily as  needed for mild constipation.   timolol (TIMOPTIC) 0.5 % ophthalmic solution Place 1 drop into both eyes in the morning.   umeclidinium-vilanterol (ANORO ELLIPTA) 62.5-25 MCG/ACT AEPB INHALE 1 PUFF BY MOUTH EVERY DAY   vancomycin (VANCOCIN) 1-5 GM/200ML-% SOLN Inject 200 mLs (1,000 mg total) into the vein every Monday, Wednesday, and Friday with hemodialysis.   No facility-administered encounter medications on file as of 08/02/2022.    Review of Systems  Skin:  Positive for wound.       Right foot ulcer     Immunization History  Administered Date(s) Administered   Fluad Quad(high Dose 65+) 06/14/2019, 07/20/2021   Hepatitis B 08/28/2006, 10/02/2007, 04/01/2008   Hepatitis B, adult 06/03/2014, 07/04/2014   Influenza Split 07/28/2011   Influenza Whole 08/28/2006, 09/10/2007, 09/15/2008, 08/03/2009, 07/26/2010   Influenza, High Dose Seasonal PF 07/04/2018   Influenza,inj,Quad PF,6+ Mos 07/04/2014, 07/06/2015, 07/12/2016, 07/11/2017   Influenza-Unspecified 06/24/2013, 06/30/2020   PFIZER(Purple Top)SARS-COV-2 Vaccination 11/16/2019, 12/07/2019, 07/23/2020   Pfizer Covid-19 Vaccine Bivalent Booster 20yr & up 07/15/2021   Pneumococcal Conjugate-13 08/18/2016   Pneumococcal Polysaccharide-23 08/28/2006, 07/28/2011, 05/30/2018   Tdap 06/14/2019   Zoster Recombinat (Shingrix) 07/24/2017, 01/09/2018   Zoster, Live 06/03/2014   Pertinent  Health Maintenance Due  Topic Date Due   INFLUENZA VACCINE  05/24/2022   HEMOGLOBIN A1C  08/09/2022   COLONOSCOPY (Pts 45-437yrInsurance coverage will need to be confirmed)  10/01/2022   OPHTHALMOLOGY EXAM  10/05/2022   FOOT EXAM  11/16/2022   LIPID PANEL  02/25/2023      07/20/2022    8:10 PM 07/21/2022    9:00 AM 07/22/2022   12:00 AM 07/22/2022    8:42 AM 08/02/2022   10:41 AM  Fall Risk  Falls in the past year?     1  Was there an injury with Fall?     0  Fall Risk Category Calculator     1  Fall Risk Category     Low  Patient Fall Risk  Level High fall risk High fall risk Moderate fall risk Moderate fall  risk Low fall risk  Patient at Risk for Falls Due to     History of fall(s);Impaired balance/gait  Fall risk Follow up     Falls evaluation completed   Functional Status Survey:    Vitals:   08/02/22 1032  BP: 136/60  Pulse: 70  Resp: 16  Temp: (!) 97 F (36.1 C)  SpO2: 95%  Weight: 159 lb 3.2 oz (72.2 kg)  Height: '5\' 9"'  (1.753 m)   Body mass index is 23.51 kg/m. Physical Exam Vitals reviewed.  Constitutional:      General: He is not in acute distress.    Appearance: Normal appearance. He is normal weight. He is not ill-appearing or diaphoretic.  HENT:     Head: Normocephalic.     Right Ear: Tympanic membrane, ear canal and external ear normal. There is no impacted cerumen.     Left Ear: Tympanic membrane, ear canal and external ear normal. There is no impacted cerumen.     Nose: Nose normal. No congestion or rhinorrhea.     Mouth/Throat:     Mouth: Mucous membranes are moist.     Pharynx: Oropharynx is clear. No oropharyngeal exudate or posterior oropharyngeal erythema.  Eyes:     General: No scleral icterus.       Right eye: No discharge.        Left eye: No discharge.     Extraocular Movements: Extraocular movements intact.     Conjunctiva/sclera: Conjunctivae normal.     Pupils: Pupils are equal, round, and reactive to light.  Neck:     Vascular: No carotid bruit.  Cardiovascular:     Rate and Rhythm: Normal rate and regular rhythm.     Pulses: Normal pulses.     Heart sounds: Normal heart sounds. No murmur heard.    No friction rub. No gallop.  Pulmonary:     Effort: Pulmonary effort is normal. No respiratory distress.     Breath sounds: Normal breath sounds. No wheezing, rhonchi or rales.  Chest:     Chest wall: No tenderness.  Abdominal:     General: Bowel sounds are normal. There is no distension.     Palpations: Abdomen is soft. There is no mass.     Tenderness: There is no abdominal  tenderness. There is no right CVA tenderness, left CVA tenderness, guarding or rebound.  Musculoskeletal:        General: No swelling or tenderness. Normal range of motion.     Cervical back: Normal range of motion. No rigidity or tenderness.     Right lower leg: No edema.     Left lower leg: No edema.  Lymphadenopathy:     Cervical: No cervical adenopathy.  Skin:    General: Skin is warm and dry.     Coloration: Skin is not pale.     Findings: No bruising, erythema, lesion or rash.     Comments: Right foot ulcer without any erythema or drainage.wound cleanse and TAB applied and covered with foam dressing, Kerlix and ACE wrap.  Neurological:     Mental Status: He is alert and oriented to person, place, and time.     Cranial Nerves: No cranial nerve deficit.     Sensory: No sensory deficit.     Motor: No weakness.     Coordination: Coordination normal.     Gait: Gait abnormal.  Psychiatric:        Speech: Speech normal.     Labs reviewed: Recent Labs  07/18/22 1240 07/18/22 1715 07/19/22 0336 07/20/22 0601 07/20/22 1703  NA 140  --  134* 136 134*  K 6.5*  --  4.7 4.6 4.4  CL 93*  --  94* 93* 95*  CO2 26  --  27 23  --   GLUCOSE 87  --  142* 145* 143*  BUN 144*  --  60* 77* 39*  CREATININE 15.45* 15.58* 8.25* 10.73* 7.10*  CALCIUM 8.6*  --  8.4* 8.8*  --   MG  --  2.7*  --  2.0  --   PHOS  --   --   --  6.9*  --    Recent Labs    02/24/22 0927 05/09/22 0000 07/18/22 1240 07/19/22 0336 07/20/22 0601  AST 23  --  34 33  --   ALT 11  --  23 22  --   ALKPHOS  --  96 88 100  --   BILITOT 0.4  --  0.7 1.2  --   PROT 7.6  --  7.6 7.7  --   ALBUMIN  --  4.1 3.1* 3.1* 2.9*   Recent Labs    07/18/22 1240 07/18/22 1715 07/19/22 0336 07/20/22 0601 07/20/22 1703 07/21/22 0428  WBC 7.4   < > 5.9 4.5  --  4.2  NEUTROABS 4.2  --   --   --   --   --   HGB 9.5*   < > 9.9* 8.6* 10.9* 8.3*  HCT 29.9*   < > 29.9* 26.0* 32.0* 24.5*  MCV 106.8*   < > 103.1* 104.8*  --   102.9*  PLT 195   < > 171 149*  --  135*   < > = values in this interval not displayed.   Lab Results  Component Value Date   TSH 0.49 12/15/2020   Lab Results  Component Value Date   HGBA1C 6.1 05/09/2022   Lab Results  Component Value Date   CHOL 154 02/24/2022   HDL 34 (L) 02/24/2022   LDLCALC 80 02/24/2022   TRIG 332 (H) 02/24/2022   CHOLHDL 4.5 02/24/2022    Significant Diagnostic Results in last 30 days:  VAS [place] ABI WITH/WO TBI  Result Date: 07/20/2022  LOWER EXTREMITY DOPPLER STUDY Patient Name:  LADARIAN BONCZEK  Date of Exam:   07/20/2022 Medical Rec #: [id number]        Accession #:    [id number] Date of Birth: 1951-01-23         Patient Gender: M Patient Age:   37 years Exam Location:  [facility] Procedure:      VAS [place] ABI WITH/WO TBI Referring Phys: Sheppard Coil [place] --------------------------------------------------------------------------------  Indications: Ulceration. High Risk Factors: Hypertension, hyperlipidemia, Diabetes.  Comparison Study: No prior study Performing Technologist: Maudry Mayhew MHA, RVT, RDCS, RDMS  Examination Guidelines: A complete evaluation includes at minimum, Doppler waveform signals and systolic blood pressure reading at the level of bilateral brachial, anterior tibial, and posterior tibial arteries, when vessel segments are accessible. Bilateral testing is considered an integral part of a complete examination. Photoelectric Plethysmograph (PPG) waveforms and toe systolic pressure readings are included as required and additional duplex testing as needed. Limited examinations for reoccurring indications may be performed as noted.  ABI Findings: +---------+------------------+-----+----------+---------+ Right    Rt Pressure (mmHg)IndexWaveform  Comment   +---------+------------------+-----+----------+---------+ Brachial  HD Access +---------+------------------+-----+----------+---------+ PTA       139               0.95 monophasic          +---------+------------------+-----+----------+---------+ DP       175               1.19 triphasic           +---------+------------------+-----+----------+---------+ Great Toe66                0.45                     +---------+------------------+-----+----------+---------+ +---------+------------------+-----+---------+---------------------------------+ Left     Lt Pressure (mmHg)IndexWaveform Comment                           +---------+------------------+-----+---------+---------------------------------+ Brachial 147                    triphasic                                  +---------+------------------+-----+---------+---------------------------------+ PTA      163               1.11 triphasic                                  +---------+------------------+-----+---------+---------------------------------+ DP       169               1.15 triphasic                                  +---------+------------------+-----+---------+---------------------------------+ Great Toe                       Present  Unable to place cuff around great                                          toe secondary to toe deformity.   +---------+------------------+-----+---------+---------------------------------+ +-------+-----------+--------------------------------+------------+------------+ ABI/TBIToday's ABIToday's TBI                     Previous ABIPrevious TBI +-------+-----------+--------------------------------+------------+------------+ Right  1.19       1.15                                                     +-------+-----------+--------------------------------+------------+------------+ Left   0.45       Unable to obtain pressure- PPG                                             waveform present.                                         +-------+-----------+--------------------------------+------------+------------+  Summary: Right: Resting right ankle-brachial index  is within normal range. The right toe-brachial index is abnormal. Left: Resting left ankle-brachial index is within normal range. *See table(s) above for measurements and observations.  Electronically signed by Harold Barban MD on 07/20/2022 at 9:05:52 PM.    Final    DG Foot 2 Views Right  Result Date: 07/20/2022 CLINICAL DATA:  Irrigation and debridement of right foot wound EXAM: RIGHT FOOT - 2 VIEW COMPARISON:  07/18/2022 FINDINGS: Interval resection of the distal portion of the right 4th metatarsal and proximal 4th phalanx. No additional acute bony abnormality. No subluxation or dislocation. Degenerative changes and hallux valgus deformity at the 1st MTP joint. IMPRESSION: Interval resection of the distal 4th metatarsal and proximal aspect of the right 4th toe proximal phalanx. Electronically Signed   By: Rolm Baptise M.D.   On: 07/20/2022 20:32   DG Foot Complete Right  Result Date: 07/19/2022 Please see detailed radiograph report in office note.  MR FOOT RIGHT WO CONTRAST  Result Date: 07/18/2022 CLINICAL DATA:  Swelling of the foot. End-stage renal disease. Type 2 diabetes. Wound along the foot. EXAM: MRI OF THE RIGHT FOREFOOT WITHOUT CONTRAST TECHNIQUE: Multiplanar, multisequence MR imaging of the right forefoot was performed. No intravenous contrast was administered. COMPARISON:  Radiographs 07/18/2022 FINDINGS: Bones/Joint/Cartilage Abnormal edema signal in bony destructive findings in the distal metaphysis and distal head of the fourth metatarsal and in the proximal phalanx of the fourth digit, most compatible with osteomyelitis. There is some deformity along the distal metaphysis of the fourth metatarsal which could indicate a superimposed fracture, but the erosive appearance and involvement of the proximal phalanx strongly favors infection. Plantar overlying  cutaneous wound noted, possibly draining from a small amount of fluid along the volar margin of the distal fourth metatarsal as on images 20 through 21 of series 5 and images 25-26 of series 8. No definite osteomyelitis elsewhere in the forefoot. There are degenerative findings at the first metatarsal head, with suspected medial erosion which can be seen in the setting of gout. Degenerative findings at the articulation with the first digit sesamoids also noted. Ligaments Lisfranc ligament intact. Muscles and Tendons Diffuse regional muscular edema, probably neurogenic. Soft tissues Diffuse circumferential subcutaneous edema in the foot, cellulitis not excluded. Small plantar wound below the distal fourth metatarsal, cannot exclude drainage site. IMPRESSION: 1. Osteomyelitis involving the fourth metatarsal distally as well as the proximal phalanx of the fourth digit. Possible plantar drainage from the distal fourth metatarsal given the overlying wound. 2. Diffuse subcutaneous edema in the forefoot, cellulitis not excluded. 3. Diffuse regional muscular edema, probably neurogenic. 4. Chronic appearing medial erosion along the first metatarsal head, possibly from gout. Electronically Signed   By: Van Clines M.D.   On: 07/18/2022 19:02   DG Foot Complete Right  Result Date: 07/18/2022 CLINICAL DATA:  Diabetic wound, assess for osteomyelitis EXAM: RIGHT FOOT COMPLETE - 3 VIEW COMPARISON:  Right foot radiograph dated November 16, 2021 FINDINGS: New lucency and irregularity of the distal fourth metatarsal and base of the proximal right phalanx. Moderate degenerative changes of the midfoot and great right toe metatarsal joint. Vascular calcifications. Soft tissues are unremarkable. IMPRESSION: New lucency and irregularity of the distal fourth metatarsal and base of the proximal right phalanx, highly concerning for osteomyelitis. Could be further evaluated with MRI. Electronically Signed   By: Yetta Glassman M.D.    On: 07/18/2022 13:59   DG Chest 2 View  Result Date: 07/18/2022 CLINICAL DATA:  Shortness of breath EXAM: CHEST - 2 VIEW  COMPARISON:  Chest radiograph June 28, 2022 FINDINGS: No pleural effusion. No pneumothorax. Postsurgical changes from median sternotomy and CABG. Aortic atherosclerotic calcifications. Interval removal of the left-sided tunneled central venous catheter. There is pulmonary venous congestion without evidence of overt pulmonary edema. Cardiomegaly. Chronic healed right-sided rib fractures. Degenerative changes of the right AC joint and glenohumeral joint. IMPRESSION: 1. Pulmonary venous congestion without evidence of overt pulmonary edema. 2.  No focal airspace opacity. Electronically Signed   By: Marin Roberts M.D.   On: 07/18/2022 13:46    Assessment/Plan  1. Diabetic osteomyelitis ([place]) Afebrile  - S/p Hospitalization as above  - continue with ABX's  - Follow up with ID on 08/04/2022 as scheduled  2. Essential hypertension B/p stable  - continue on carvedilol,Imdur   3. Hyperlipidemia associated with type 2 diabetes mellitus (HCC) LDL at goal  - continue on Atorvastatin   4. ESRD on hemodialysis [facility]) Continue on Hemodialysis M,W,and F   Family/ staff Communication: Reviewed plan of care with patient verbalized understanding.  Labs/tests ordered: None   Next Appointment: Return if symptoms worsen or fail to improve.   [name], NP

## 2022-08-03 ENCOUNTER — Other Ambulatory Visit: Payer: Self-pay

## 2022-08-03 ENCOUNTER — Telehealth: Payer: Self-pay

## 2022-08-03 DIAGNOSIS — Z992 Dependence on renal dialysis: Secondary | ICD-10-CM | POA: Diagnosis not present

## 2022-08-03 DIAGNOSIS — E1122 Type 2 diabetes mellitus with diabetic chronic kidney disease: Secondary | ICD-10-CM | POA: Diagnosis not present

## 2022-08-03 DIAGNOSIS — M868X8 Other osteomyelitis, other site: Secondary | ICD-10-CM | POA: Diagnosis not present

## 2022-08-03 DIAGNOSIS — N2581 Secondary hyperparathyroidism of renal origin: Secondary | ICD-10-CM | POA: Diagnosis not present

## 2022-08-03 DIAGNOSIS — D631 Anemia in chronic kidney disease: Secondary | ICD-10-CM | POA: Diagnosis not present

## 2022-08-03 DIAGNOSIS — D649 Anemia, unspecified: Secondary | ICD-10-CM | POA: Diagnosis not present

## 2022-08-03 DIAGNOSIS — N186 End stage renal disease: Secondary | ICD-10-CM | POA: Diagnosis not present

## 2022-08-03 LAB — HEMOGLOBIN A1C: Hemoglobin A1C: 6.4

## 2022-08-03 NOTE — Patient Outreach (Addendum)
  Care Coordination TOC Note Transition Care Management Follow-up Telephone Call Date of discharge and from where: 07/22/22-[facility]  Dx: osteomyelitis, right diabetic foot ulcer- S/p fourth metatarsal head resection and fourth proximal phalanx partial resection right foot on 07/20/22. How have you been since you were released from the hospital? Patient reports that thing are "going okay".  He went to HD tx earlier today.  Any questions or concerns? Yes-he voices that he is having issues with his heating system in apartment-running too hot at times. He called apartment personnel but office closed at 12N today. He will try to call them back in the morning. Patient states that he is out of his pain meds.He called PCP office last week to advise of this. States he was only given a seven day supply. Patient had follow up appt yesterday and asked patient if he discussed pain mgmt with MD during visit. Patient vague and unclear-kept repeating "he knows I need my pain med." Patient voices he has called office today and spoke with CMA. He is going to call office again after this call to follow up to see if med has been called in to the pharmacy.   Items Reviewed: Did the pt receive and understand the discharge instructions provided? Yes  Medications obtained and verified?  Patient declined med review Other? Yes -wound care-pt reports no issues with wound at present Any new allergies since your discharge? No  Dietary orders reviewed? Yes Do you have support at home? No -Patient reported he does not have family of friends that are local   Home Care and Equipment/Supplies: Were home health services ordered? Patient declined [facility] services while inpatient If so, what is the name of the agency? N/A  Has the agency set up a time to come to the patient's home? not applicable Were any new equipment or medical supplies ordered?  Yes: rolling walker What is the name of the medical supply agency? [employer] Were you  able to get the supplies/equipment? yes Do you have any questions related to the use of the equipment or supplies? No  Functional Questionnaire: (I = Independent and D = Dependent) ADLs: I  Bathing/Dressing- I  Meal Prep- I  Eating- I  Maintaining continence- I  Transferring/Ambulation- I  Managing Meds- I  Follow up appointments reviewed:  PCP Hospital f/u appt confirmed? Yes PCP on 08/02/22. [facility] f/u appt confirmed? Yes  Scheduled to see Dr. [name] on 08/04/22 @ 9am and [facility] on 08/04/22 at 12:30pm. Patient to see Dr. [name] on 08/09/22 at 3:35pm. Are transportation arrangements needed? No -Patient uses [facility]-declined need for additional resources If their condition worsens, is the pt aware to call PCP or go to the Emergency Dept.? Yes Was the patient provided with contact information for the PCP's office or ED? Yes Was to pt encouraged to call back with questions or concerns? Yes  SDOH assessments and interventions completed:   Yes  Care Coordination Interventions Activated:  Yes   Care Coordination Interventions:  Referred for Care Coordination Services:  RN Care Coordinator   Encounter Outcome:  Pt. Visit Completed     Enzo Montgomery, RN,BSN,CCM [employer] [facility] Telephonic Care Management Coordinator Direct Phone: [phone] Toll Free: [phone] Fax: [phone]

## 2022-08-03 NOTE — Telephone Encounter (Signed)
Patient called requesting refill on Oxycodone 5/'325mg'$ . Medication last refilled 07/26/2022. Patient needs to update contract.  Medication pended and sent to Dr. Alain Honey

## 2022-08-04 ENCOUNTER — Encounter (HOSPITAL_BASED_OUTPATIENT_CLINIC_OR_DEPARTMENT_OTHER): Payer: Medicare Other | Admitting: General Surgery

## 2022-08-04 ENCOUNTER — Ambulatory Visit: Payer: Medicare Other | Admitting: Adult Health

## 2022-08-04 ENCOUNTER — Encounter: Payer: Self-pay | Admitting: Internal Medicine

## 2022-08-04 ENCOUNTER — Ambulatory Visit (INDEPENDENT_AMBULATORY_CARE_PROVIDER_SITE_OTHER): Payer: Medicare Other | Admitting: Internal Medicine

## 2022-08-04 ENCOUNTER — Telehealth: Payer: Self-pay

## 2022-08-04 ENCOUNTER — Encounter: Payer: Self-pay | Admitting: *Deleted

## 2022-08-04 ENCOUNTER — Ambulatory Visit: Payer: Medicare Other | Admitting: Orthopedic Surgery

## 2022-08-04 ENCOUNTER — Other Ambulatory Visit: Payer: Self-pay

## 2022-08-04 VITALS — BP 105/58 | HR 74 | Temp 97.6°F | Resp 16

## 2022-08-04 DIAGNOSIS — B20 Human immunodeficiency virus [HIV] disease: Secondary | ICD-10-CM | POA: Diagnosis not present

## 2022-08-04 DIAGNOSIS — E11628 Type 2 diabetes mellitus with other skin complications: Secondary | ICD-10-CM | POA: Diagnosis not present

## 2022-08-04 DIAGNOSIS — N186 End stage renal disease: Secondary | ICD-10-CM | POA: Diagnosis not present

## 2022-08-04 DIAGNOSIS — M1990 Unspecified osteoarthritis, unspecified site: Secondary | ICD-10-CM | POA: Diagnosis not present

## 2022-08-04 DIAGNOSIS — L089 Local infection of the skin and subcutaneous tissue, unspecified: Secondary | ICD-10-CM | POA: Diagnosis not present

## 2022-08-04 DIAGNOSIS — A048 Other specified bacterial intestinal infections: Secondary | ICD-10-CM

## 2022-08-04 DIAGNOSIS — T8189XA Other complications of procedures, not elsewhere classified, initial encounter: Secondary | ICD-10-CM | POA: Diagnosis not present

## 2022-08-04 DIAGNOSIS — Z23 Encounter for immunization: Secondary | ICD-10-CM

## 2022-08-04 DIAGNOSIS — E11621 Type 2 diabetes mellitus with foot ulcer: Secondary | ICD-10-CM | POA: Diagnosis not present

## 2022-08-04 DIAGNOSIS — I5042 Chronic combined systolic (congestive) and diastolic (congestive) heart failure: Secondary | ICD-10-CM | POA: Diagnosis not present

## 2022-08-04 DIAGNOSIS — N2581 Secondary hyperparathyroidism of renal origin: Secondary | ICD-10-CM | POA: Diagnosis not present

## 2022-08-04 DIAGNOSIS — Z992 Dependence on renal dialysis: Secondary | ICD-10-CM | POA: Diagnosis not present

## 2022-08-04 DIAGNOSIS — Z794 Long term (current) use of insulin: Secondary | ICD-10-CM | POA: Diagnosis not present

## 2022-08-04 DIAGNOSIS — L97512 Non-pressure chronic ulcer of other part of right foot with fat layer exposed: Secondary | ICD-10-CM | POA: Diagnosis not present

## 2022-08-04 DIAGNOSIS — M109 Gout, unspecified: Secondary | ICD-10-CM | POA: Diagnosis not present

## 2022-08-04 DIAGNOSIS — I132 Hypertensive heart and chronic kidney disease with heart failure and with stage 5 chronic kidney disease, or end stage renal disease: Secondary | ICD-10-CM | POA: Diagnosis not present

## 2022-08-04 DIAGNOSIS — E1151 Type 2 diabetes mellitus with diabetic peripheral angiopathy without gangrene: Secondary | ICD-10-CM | POA: Diagnosis not present

## 2022-08-04 DIAGNOSIS — E1140 Type 2 diabetes mellitus with diabetic neuropathy, unspecified: Secondary | ICD-10-CM | POA: Diagnosis not present

## 2022-08-04 DIAGNOSIS — M86671 Other chronic osteomyelitis, right ankle and foot: Secondary | ICD-10-CM | POA: Diagnosis not present

## 2022-08-04 DIAGNOSIS — I255 Ischemic cardiomyopathy: Secondary | ICD-10-CM | POA: Diagnosis not present

## 2022-08-04 DIAGNOSIS — E1122 Type 2 diabetes mellitus with diabetic chronic kidney disease: Secondary | ICD-10-CM | POA: Diagnosis not present

## 2022-08-04 MED ORDER — OXYCODONE-ACETAMINOPHEN 5-325 MG PO TABS: 1.0000 | ORAL_TABLET | Freq: Four times a day (QID) | ORAL | 0 refills | Status: DC | PRN

## 2022-08-04 MED ORDER — BIKTARVY 50-200-25 MG PO TABS: 1.0000 | ORAL_TABLET | Freq: Every day | ORAL | 11 refills | Status: DC

## 2022-08-04 NOTE — Assessment & Plan Note (Signed)
His infection has been under excellent, long-term control.  He will continue Biktarvy.  He received his influenza vaccine here today.

## 2022-08-04 NOTE — Assessment & Plan Note (Signed)
Although he has failed treatment for Helicobacter on 3 occasions he is not having any signs of recurrent Helicobacter pylori gastritis.  He does not want to pursue further treatment at this time.

## 2022-08-04 NOTE — Telephone Encounter (Signed)
Patient called back and requested his Rx Request to be sent to Dinah since she saw her last and see if she would approve for him to get his Pain medication. Stated that he has to get it every 7 days.   Rx pended and sent to [facility] for approval as requested.

## 2022-08-04 NOTE — Telephone Encounter (Signed)
error 

## 2022-08-04 NOTE — Telephone Encounter (Signed)
Patient called this morning wanting to know the status of his medication.   Resent to Dr. Sabra Heck

## 2022-08-04 NOTE — Assessment & Plan Note (Signed)
He is tolerating therapy for diabetic foot infection and fourth metatarsal osteomyelitis.  He will follow-up here on 08/31/2022 to determine if it is appropriate to stop antibiotics at that time.

## 2022-08-04 NOTE — Telephone Encounter (Signed)
Patient called back asking about his oxycodone. I couldn't find the rx. When Rodena Piety came back from lunch she showed me.I called patient and let him know it was still pending. Patient then asked about a test he was supposed to have, which he did not mention to me when he called. He said Rodena Piety would know. I went and asked Rodena Piety and she said it was a drug screening. I called back and told him the provider he sees makes the decision wether to do the drug screening or not. Patient got upset and said "what's going on too many hands are in this. Dr. Sabra Heck gave me something to sign saying he would let me know when he was going to do the test, because I can't urinate." I explained to the patient the only thing I can do is send the message to Dr. Sabra Heck. "I'm hurting I need my medicine", as I was hanging up.

## 2022-08-04 NOTE — Progress Notes (Signed)
Jacob, [name] ([id number]) [id number] Page 1 of 8 Visit Report for 08/04/2022 Arrival Information Details Patient Name: Date of Service: Jacob [name], Jacob [name] 08/04/2022 12:30 PM Medical Record Number: [id number] Patient Account Number: [id number] Date of Birth/Sex: Treating RN: August 11, 1951 (71 y.o. Jacob [name], Jacob [name] Primary Care Fedor Kazmierski: Alain Honey Other Clinician: Referring Tyra Gural: Treating Brailey Buescher/Extender: Neil Crouch in Treatment: 72 Visit Information History Since Last Visit Added or deleted any medications: No Patient Arrived: Jacob [name] Any new allergies or adverse reactions: No Arrival Time: 12:43 Had a fall or experienced change in No Accompanied By: self activities of daily living that may affect Transfer Assistance: None risk of falls: Patient Identification Verified: Yes Signs or symptoms of abuse/neglect since last visito No Secondary Verification Process Completed: Yes Hospitalized since last visit: No Patient Requires Transmission-Based Precautions: No Implantable device outside of the clinic excluding No Patient Has Alerts: No cellular tissue based products placed in the center since last visit: Has Dressing in Place as Prescribed: Yes Pain Present Now: No Electronic Signature(s) Signed: 08/04/2022 5:52:03 PM By: Baruch Gouty RN, BSN Entered By: Baruch Gouty on 08/04/2022 12:48:02 -------------------------------------------------------------------------------- Lower Extremity Assessment Details Patient Name: Date of Service: Jacob [name]. 08/04/2022 12:30 PM Medical Record Number: [id number] Patient Account Number: [id number] Date of Birth/Sex: Treating RN: [birth year] (71 y.o. Jacob [name] Primary Care Marlette Curvin: Alain Honey Other Clinician: Referring Dracen Reigle: Treating Shamera Yarberry/Extender: Neil Crouch in Treatment: 22 Edema Assessment Assessed: [name]:  No] [Right: No] Edema: [Left: N] [Right: o] Calf Left: Right: Point of Measurement: From Medial Instep 33.4 cm Ankle Left: Right: Point of Measurement: From Medial Instep 19.5 cm Vascular Assessment Pulses: Dorsalis Pedis Palpable: [Right:Yes [id number] Page 2 of 8] Electronic Signature(s) Signed: 08/04/2022 5:52:03 PM By: Baruch Gouty RN, BSN Entered By: Baruch Gouty on 08/04/2022 12:52:18 -------------------------------------------------------------------------------- Multi Wound Chart Details Patient Name: Date of Service: Jacob [name]. 08/04/2022 12:30 PM Medical Record Number: [id number] Patient Account Number: [id number] Date of Birth/Sex: Treating RN: [birth year] (71 y.o. M) Primary Care Dawanda Mapel: Alain Honey Other Clinician: Referring Mekel Haverstock: Treating Tashonda Pinkus/Extender: Neil Crouch in Treatment: 22 Vital Signs Height(in): 36 Capillary Blood Glucose(mg/dl): 200 Weight(lbs): 172 Pulse(bpm): 41 Body Mass Index(BMI): 22.1 Blood Pressure(mmHg): 116/72 Temperature(F): 97.7 Respiratory Rate(breaths/min): 18 [8:Photos:] [N/A:N/A] Right, Plantar Foot Right, Dorsal Foot N/A Wound Location: Blister Surgical Injury N/A Wounding Event: Diabetic Wound/Ulcer of the Lower Open Surgical Wound N/A Primary Etiology: Extremity N/A Diabetic Wound/Ulcer of the Lower N/A Secondary Etiology: Extremity Cataracts, Glaucoma, Anemia, Chronic Cataracts, Glaucoma, Anemia, Chronic N/A Comorbid History: Obstructive Pulmonary Disease Obstructive Pulmonary Disease (COPD), Congestive Heart Failure, (COPD), Congestive Heart Failure, Coronary Artery Disease, Coronary Artery Disease, Hypertension, Myocardial Infarction, Hypertension, Myocardial Infarction, Hepatitis C, Type II Diabetes, Gout, Hepatitis C, Type II Diabetes, Gout, Osteoarthritis, Neuropathy Osteoarthritis, Neuropathy 03/08/2022 07/21/2022 N/A Date  Acquired: 21 1 N/A Weeks of Treatment: Open Open N/A Wound Status: No No N/A Wound Recurrence: 0.8x0.4x3 0.8x0.3x0.2 N/A Measurements L x W x D (cm) 0.251 0.188 N/A A (cm) : rea 0.754 0.038 N/A Volume (cm) : 77.20% -138.00% N/A % Reduction in A rea: -128.50% -137.50% N/A % Reduction in Volume: 6 Starting Position 1 (o'clock): 9 Ending Position 1 (o'clock): 1 Maximum Distance 1 (cm): Yes No N/A Undermining: Grade 2 Full Thickness Without Exposed N/A Classification: Support Structures Medium Medium N/A Exudate Amount: Serosanguineous Serosanguineous N/A Exudate Type: red, brown red, brown N/A Exudate Color: Thickened Distinct, outline attached  N/A Wound Margin: Large (67-100%) Large (67-100%) N/A Granulation Amount: Red Red N/A Granulation Quality: Small (1-33%) Small (1-33%) N/A Necrotic Amount: Fat Layer (Subcutaneous Tissue): Yes Fat Layer (Subcutaneous Tissue): Yes N/A Exposed Structures: Fascia: No Fascia: No Tendon: No Tendon: No Muscle: No Muscle: No Joint: No Joint: No [name], [name] ([id number]) [id number] Page 3 of 8 Bone: No Bone: No Small (1-33%) Small (1-33%) N/A Epithelialization: Debridement - Excisional Debridement - Selective/Open Wound N/A Debridement: 13:15 13:15 N/A Pre-procedure Verification/Time Out Taken: Callus, Subcutaneous, [facility] N/A Tissue Debrided: Skin/Subcutaneous Tissue Non-Viable Tissue N/A Level: 1 0.24 N/A Debridement A (sq cm): rea Curette Curette N/A Instrument: Minimum Minimum N/A Bleeding: Pressure Pressure N/A Hemostasis A chieved: 0 0 N/A Procedural Pain: 0 0 N/A Post Procedural Pain: Procedure was tolerated well Procedure was tolerated well N/A Debridement Treatment Response: 0.8x0.8x3 0.8x0.3x0.2 N/A Post Debridement Measurements L x W x D (cm) 1.508 0.038 N/A Post Debridement Volume: (cm) Callus: Yes No Abnormalities Noted N/A Periwound Skin  Texture: No Abnormalities Noted No Abnormalities Noted N/A Periwound Skin Moisture: No Abnormalities Noted No Abnormalities Noted N/A Periwound Skin Color: No Abnormality No Abnormality N/A Temperature: Yes Yes N/A Tenderness on Palpation: Debridement Debridement N/A Procedures Performed: Treatment Notes Electronic Signature(s) Signed: 08/04/2022 1:37:28 PM By: Fredirick Maudlin MD FACS Entered By: Fredirick Maudlin on 08/04/2022 13:37:28 -------------------------------------------------------------------------------- Multi-Disciplinary Care Plan Details Patient Name: Date of Service: Jacob [name]. 08/04/2022 12:30 PM Medical Record Number: [id number] Patient Account Number: [id number] Date of Birth/Sex: Treating RN: [birth year] (71 y.o. Jacob [name] Primary Care Icess Bertoni: Alain Honey Other Clinician: Referring Anye Brose: Treating Savannah Morford/Extender: Neil Crouch in Treatment: [place] reviewed with physician Active Inactive Nutrition Nursing Diagnoses: Impaired glucose control: actual or potential Potential for alteratiion in Nutrition/Potential for imbalanced nutrition Goals: Patient/caregiver will maintain therapeutic glucose control Date Initiated: 05/10/2022 Target Resolution Date: 08/11/2022 Goal Status: Active Interventions: Assess HgA1c results as ordered upon admission and as needed Provide education on elevated blood sugars and impact on wound healing Treatment Activities: Dietary management education, guidance and counseling : 05/10/2022 Patient referred to Primary Care Physician for further nutritional evaluation : 05/10/2022 Notes: Wound/Skin Impairment Nursing Diagnoses: Impaired tissue integrity SHELL, YANDOW ([id number]) [id number] Page 4 of 8 Goals: Patient/caregiver will verbalize understanding of skin care regimen Date Initiated: 03/01/2022 Target Resolution Date:  08/11/2022 Goal Status: Active Interventions: Assess ulceration(s) every visit Treatment Activities: Skin care regimen initiated : 03/01/2022 Notes: Electronic Signature(s) Signed: 08/04/2022 5:52:03 PM By: Baruch Gouty RN, BSN Entered By: Baruch Gouty on 08/04/2022 13:02:24 -------------------------------------------------------------------------------- Pain Assessment Details Patient Name: Date of Service: Jacob [name]. 08/04/2022 12:30 PM Medical Record Number: [id number] Patient Account Number: [id number] Date of Birth/Sex: Treating RN: Nov 06, 1950 (71 y.o. Jacob [name] Primary Care Abuk Selleck: Alain Honey Other Clinician: Referring Aila Terra: Treating Joseguadalupe Stan/Extender: Neil Crouch in Treatment: 22 Active Problems Location of Pain Severity and Description of Pain Patient Has Paino No Site Locations Rate the pain. Current Pain Level: 0 Pain Management and Medication Current Pain Management: Electronic Signature(s) Signed: 08/04/2022 5:52:03 PM By: Baruch Gouty RN, BSN Entered By: Baruch Gouty on 08/04/2022 12:49:13 [name] ([id number]) [id number] Page 5 of 8 -------------------------------------------------------------------------------- Patient/Caregiver Education Details Patient Name: Date of Service: OVADIA, LOPP 10/12/2023andnbsp12:30 PM Medical Record Number: [id number] Patient Account Number: [id number] Date of Birth/Gender: Treating RN: 10-May-1951 (71 y.o. Jacob [name] Primary Care Physician: Alain Honey Other Clinician: Referring Physician: Treating  Physician/Extender: Neil Crouch in Treatment: 22 Education Assessment Education Provided To: Patient Education Topics Provided Elevated Blood Sugar/ Impact on Healing: Methods: Explain/Verbal Responses: Reinforcements needed, State content correctly Offloading: Methods:  Explain/Verbal Responses: Reinforcements needed, State content correctly Wound/Skin Impairment: Methods: Explain/Verbal Responses: Reinforcements needed, State content correctly Electronic Signature(s) Signed: 08/04/2022 5:52:03 PM By: Baruch Gouty RN, BSN Entered By: Baruch Gouty on 08/04/2022 13:03:18 -------------------------------------------------------------------------------- Wound Assessment Details Patient Name: Date of Service: Jacob [name]. 08/04/2022 12:30 PM Medical Record Number: [id number] Patient Account Number: [id number] Date of Birth/Sex: Treating RN: 01-Jul-1951 (71 y.o. Jacob [name] Primary Care Sameeha Rockefeller: Alain Honey Other Clinician: Referring Gayland Nicol: Treating Shereka Lafortune/Extender: Neil Crouch in Treatment: 22 Wound Status Wound Number: 8 Primary Diabetic Wound/Ulcer of the Lower Extremity Etiology: Wound Location: Right, Plantar Foot Wound Open Wounding Event: Blister Status: Date Acquired: 03/08/2022 Comorbid Cataracts, Glaucoma, Anemia, Chronic Obstructive Pulmonary Weeks Of Treatment: 21 History: Disease (COPD), Congestive Heart Failure, Coronary Artery Clustered Wound: No Disease, Hypertension, Myocardial Infarction, Hepatitis C, Type II Diabetes, Gout, Osteoarthritis, Neuropathy Photos JAXIN, FULFER ([id number]) [id number] Page 6 of 8 Wound Measurements Length: (cm) 0.8 Width: (cm) 0.4 Depth: (cm) 3 Area: (cm) 0.251 Volume: (cm) 0.754 % Reduction in Area: 77.2% % Reduction in Volume: -128.5% Epithelialization: Small (1-33%) Tunneling: No Undermining: Yes Starting Position (o'clock): 6 Ending Position (o'clock): 9 Maximum Distance: (cm) 1 Wound Description Classification: Grade 2 Wound Margin: Thickened Exudate Amount: Medium Exudate Type: Serosanguineous Exudate Color: red, brown Foul Odor After Cleansing: No Slough/Fibrino Yes Wound Bed Granulation Amount:  Large (67-100%) Exposed Structure Granulation Quality: Red Fascia Exposed: No Necrotic Amount: Small (1-33%) Fat Layer (Subcutaneous Tissue) Exposed: Yes Necrotic Quality: Adherent Slough Tendon Exposed: No Muscle Exposed: No Joint Exposed: No Bone Exposed: No Periwound Skin Texture Texture Color No Abnormalities Noted: No No Abnormalities Noted: Yes Callus: Yes Temperature / Pain Temperature: No Abnormality Moisture No Abnormalities Noted: Yes Tenderness on Palpation: Yes Electronic Signature(s) Signed: 08/04/2022 5:50:33 PM By: Dellie Catholic RN Signed: 08/04/2022 5:52:03 PM By: Baruch Gouty RN, BSN Entered By: Dellie Catholic on 08/04/2022 13:00:09 -------------------------------------------------------------------------------- Wound Assessment Details Patient Name: Date of Service: Jacob [name]. 08/04/2022 12:30 PM Medical Record Number: [id number] Patient Account Number: [id number] Date of Birth/Sex: Treating RN: [birth year] (71 y.o. Jacob [name] Primary Care Arnitra Sokoloski: Alain Honey Other Clinician: Referring Laine Giovanetti: Treating Cait Locust/Extender: Neil Crouch in Treatment: 22 Wound Status Wound Number: 9 Primary Open Surgical Wound Etiology: Wound Location: Right, Dorsal Foot Secondary Diabetic Wound/Ulcer of the Lower Extremity Wounding Event: Surgical Injury Etiology: Date Acquired: 07/21/2022 Wound Open Weeks Of Treatment: 1 Status: Clustered Wound: No Comorbid Cataracts, Glaucoma, Anemia, Chronic Obstructive Pulmonary History: Disease (COPD), Congestive Heart Failure, Coronary Artery Disease, Hypertension, Myocardial Infarction, Hepatitis C, Type II Diabetes, Gout, Osteoarthritis, Neuropathy Photos EARLAND, REISH ([id number]) [id number] Page 7 of 8 Wound Measurements Length: (cm) 0.8 Width: (cm) 0.3 Depth: (cm) 0.2 Area: (cm) 0.188 Volume: (cm) 0.038 % Reduction in Area: -138% %  Reduction in Volume: -137.5% Epithelialization: Small (1-33%) Tunneling: No Undermining: No Wound Description Classification: Full Thickness Without Exposed Support Structures Wound Margin: Distinct, outline attached Exudate Amount: Medium Exudate Type: Serosanguineous Exudate Color: red, brown Foul Odor After Cleansing: No Slough/Fibrino No Wound Bed Granulation Amount: Large (67-100%) Exposed Structure Granulation Quality: Red Fascia Exposed: No Necrotic Amount: Small (1-33%) Fat Layer (Subcutaneous Tissue) Exposed: Yes Necrotic Quality: Adherent Slough Tendon Exposed: No Muscle Exposed: No Joint  Exposed: No Bone Exposed: No Periwound Skin Texture Texture Color No Abnormalities Noted: Yes No Abnormalities Noted: Yes Moisture Temperature / Pain No Abnormalities Noted: Yes Temperature: No Abnormality Tenderness on Palpation: Yes Electronic Signature(s) Signed: 08/04/2022 5:50:33 PM By: Dellie Catholic RN Signed: 08/04/2022 5:52:03 PM By: Baruch Gouty RN, BSN Entered By: Dellie Catholic on 08/04/2022 13:00:32 -------------------------------------------------------------------------------- Vitals Details Patient Name: Date of Service: Jacob [name]. 08/04/2022 12:30 PM Medical Record Number: [id number] Patient Account Number: [id number] Date of Birth/Sex: Treating RN: February 04, 1951 (71 y.o. Jacob [name] Primary Care Rohin Krejci: Alain Honey Other Clinician: Referring Daiden Coltrane: Treating Deron Poole/Extender: Neil Crouch in Treatment: 22 Vital Signs Time Taken: 12:48 Temperature (F): 97.7 Height (in): 74 Pulse (bpm): 78 Weight (lbs): 172 Respiratory Rate (breaths/min): 18 Body Mass Index (BMI): 22.1 Blood Pressure (mmHg): 116/72 Capillary Blood Glucose (mg/dl): 200 Reference Range: 80 - 120 mg / dl SHAMUS, DESANTIS ([id number]) [id number] Page 8 of 8 Notes glucose per pt report this am Electronic  Signature(s) Signed: 08/04/2022 5:52:03 PM By: Baruch Gouty RN, BSN Entered By: Baruch Gouty on 08/04/2022 12:49:04

## 2022-08-04 NOTE — Progress Notes (Signed)
Jacob [name] ([id number]) [id number] Page 1 of 14 Visit Report for 08/04/2022 Chief Complaint Document Details Patient Name: Date of Service: Jacob [name], Jacob [name] 08/04/2022 12:30 PM Medical Record Number: [id number] Patient Account Number: [id number] Date of Birth/Sex: Treating RN: [birth year] (71 y.o. M) Primary Care Provider: Alain [name] Other Clinician: Referring Provider: Treating Provider/Extender: Jacob [name] in Treatment: 22 Information Obtained from: Patient Chief Complaint Patient seen for complaints of Non-Healing Wounds x 3 Electronic Signature(s) Signed: 08/04/2022 1:37:34 PM By: Jacob Maudlin MD FACS Entered By: Jacob [name] on 08/04/2022 13:37:34 -------------------------------------------------------------------------------- Debridement Details Patient Name: Date of Service: Jacob Ache. 08/04/2022 12:30 PM Medical Record Number: [id number] Patient Account Number: [id number] Date of Birth/Sex: Treating RN: 1951/06/13 (71 y.o. Jacob [name] Primary Care Provider: Alain [name] Other Clinician: Referring Provider: Treating Provider/Extender: Jacob [name] in Treatment: 22 Debridement Performed for Assessment: Wound #8 Right,Plantar Foot Performed By: Physician Jacob Maudlin, MD Debridement Type: Debridement Severity of Tissue Pre Debridement: Fat layer exposed Level of Consciousness (Pre-procedure): Awake and Alert Pre-procedure Verification/Time Out Yes - 13:15 Taken: Start Time: 13:15 T Area Debrided (L x W): otal 1 (cm) x 1 (cm) = 1 (cm) Tissue and other material debrided: Viable, Non-Viable, Callus, Slough, Subcutaneous, Skin: Epidermis, Slough Level: Skin/Subcutaneous Tissue Debridement Description: Excisional Instrument: Curette Bleeding: Minimum Hemostasis Achieved: Pressure Procedural Pain: 0 Post Procedural Pain: 0 Response to Treatment:  Procedure was tolerated well Level of Consciousness (Post- Awake and Alert procedure): Post Debridement Measurements of Total Wound Length: (cm) 0.8 Width: (cm) 0.8 Depth: (cm) 3 Volume: (cm) 1.508 Character of Wound/Ulcer Post Debridement: Improved Severity of Tissue Post Debridement: Fat layer exposed Post Procedure Diagnosis Jacob [name] ([id number]) [id number] Page 2 of 14 Same as Pre-procedure Notes scribed by Jacob Gouty, RN for Dr. Celine [name] Electronic Signature(s) Signed: 08/04/2022 1:45:39 PM By: Jacob Maudlin MD FACS Signed: 08/04/2022 5:52:03 PM By: Jacob Gouty RN, BSN Entered By: Jacob [name] on 08/04/2022 13:20:20 -------------------------------------------------------------------------------- Debridement Details Patient Name: Date of Service: Jacob Ache. 08/04/2022 12:30 PM Medical Record Number: [id number] Patient Account Number: [id number] Date of Birth/Sex: Treating RN: [birth year] (71 y.o. Jacob [name] Primary Care Provider: Alain [name] Other Clinician: Referring Provider: Treating Provider/Extender: Jacob [name] in Treatment: 22 Debridement Performed for Assessment: Wound #9 Right,Dorsal Foot Performed By: Physician Jacob Maudlin, MD Debridement Type: Debridement Severity of Tissue Pre Debridement: Fat layer exposed Level of Consciousness (Pre-procedure): Awake and Alert Pre-procedure Verification/Time Out Yes - 13:15 Taken: Start Time: 13:15 T Area Debrided (L x W): otal 0.8 (cm) x 0.3 (cm) = 0.24 (cm) Tissue and other material debrided: Non-Viable, Slough, Slough Level: Non-Viable Tissue Debridement Description: Selective/Open Wound Instrument: Curette Bleeding: Minimum Hemostasis Achieved: Pressure Procedural Pain: 0 Post Procedural Pain: 0 Response to Treatment: Procedure was tolerated well Level of Consciousness (Post- Awake and Alert procedure): Post  Debridement Measurements of Total Wound Length: (cm) 0.8 Width: (cm) 0.3 Depth: (cm) 0.2 Volume: (cm) 0.038 Character of Wound/Ulcer Post Debridement: Improved Severity of Tissue Post Debridement: Fat layer exposed Post Procedure Diagnosis Same as Pre-procedure Notes scribed by Jacob Gouty, RN for Dr. Celine [name] Electronic Signature(s) Signed: 08/04/2022 1:45:39 PM By: Jacob Maudlin MD FACS Signed: 08/04/2022 5:52:03 PM By: Jacob Gouty RN, BSN Entered By: Jacob [name] on 08/04/2022 13:20:52 Jacob [name] ([id number]) [id number] Page 3 of 14 -------------------------------------------------------------------------------- HPI Details Patient Name: Date of Service: Jacob [name], [name] 08/04/2022 12:30 PM Medical  Record Number: [id number] Patient Account Number: [id number] Date of Birth/Sex: Treating RN: Jun 28, 1951 (71 y.o. M) Primary Care Provider: Alain [name] Other Clinician: Referring Provider: Treating Provider/Extender: Jacob [name] in Treatment: 22 History of Present Illness Location: 2 ulcers noted to the left hallux Quality: the patient denies pain Duration: these were originally blisters towards the end of February and opened in early March Context: these wounds are secondary to pressure; he got new shoes in February and also started wearing a toe cover/sleeve Modifying Factors: pressure and friction are exacerbating factor HPI Description: 01/12/17- [name] arrives for initial evaluation of 2 ulcers to his left hallux. He states that back in February he did receive a new pair of diabetic shoes and at that time he started wearing toe cover/sleeves and noticed development of a blister to the dorsal and plantar aspect. He has been seen at [facility] by both Dr. Amalia Hailey and Dr. Jacqualyn Posey. He last saw Dr.Wagoner on 3/8 at which time he was prescribed Silvadene and Keflex. He completed that dose of Keflex  and states that he got a phone call to extend the Keflex prior to this appointment. He has a follow-up with Dr. Amalia Hailey on 3/26. He has been wearing a surgical shoe, no significant offloading to the plantar/medial ulcer but the strap does not extend over the dorsal ulcer. Topically he has been applying the Silvadene daily and a dry dressing. He is a diabetic, on insulin, with a recent A1c of 6.2% (11/10/16). He is a current smoker, 0.5 PPD. He has no known diagnosis of arterial insufficiency. 01/20/17- The patient arrives for follow up evaluation of his ulcers to the left hallux. He admits to continuing to smoke approximately 0.5ppd. He states that his diet has been somewhat uncontrolled with higher than normal glucose levels, this seems circumstantial as he had a death in the family as food and schedule has been disrupted. he admits to wearing the surgical show with offloading modifications while out of the home, but has been ambulating barefoot around the home. he saw Dr. Amalia Hailey yesterday for toenail trimming and callus shaving to the right plantar foot and investigation of his ulcers. 01/26/17- he is here for follow-up evaluation of his left hallux ulcers. He states he has been significantly more compliant in the use of his offloading surgical shoe, with the exception of "getting up for a glass of water" he states that he has worn it with any ambulation. He states he has decreased his smoking too, admitting to only a cigarette since yesterday. He states his blood sugar this morning was 147 and yesterday was 116. He is voicing no complaints or concerns 02/02/17- He is here for follow-up evaluation of his left hallux ulcers. The dorsal ulcer is crusted over/healed. He states he has minimized his ambulation over this past week, much less than the previous week. He states his glucose levels have been between 120-150 about half of the time, otherwise greater than 150. He cannot correlate any specific dietary  changes. He does have an appointment with a dietitian next month. He was advised to keep a log of his glucose levels and his diet for that appointment. 02/06/17; patient comes in today for first application of the total contact cast. Her intake nurse reported the wounds look better therefore I did not actually see these before application of the cast. He'll return on Thursday for reapplication in the standard fashion 02/10/27- he is here for follow-up evaluation of his left  plantar hallux ulcer. He came in Monday for application of a total contact cast. He states that he was tolerating that Monday, but noticed on Tuesday that his foot began to slide in the cast. He also is complaining of the heaviness of the cast, as he ambulates as his primary form of transportation. He continues to smoke,. He has not contacted his cardiologist regarding Nicorette gum. He states his blood sugars have been slightly elevated, this morning being 206. He states that he will complete an antibiotic tonight. He states this was prescribed by Dr. Jacqualyn Posey. He states that he has refilled this prescription twice, without direction to continue taking this medication. 02/16/17; patient wound about the same as last week per her description. Rolled edges of callus scan and nonviable subcutaneous tissue. Using Prisma. Apparently used topical antibiotic instead of the Prisma over the course of this week. He has not a total contact cast candidate secondary to ambulation needs and unsteadiness on his feet per discussion with our intake nurse 03/02/17 patient requires debridement. He has a small wound on the right great toe which probes however does not approach bone. What I can see of the base of this wound looks reasonably healthy and I think this is largely in offloading issue with the second toe crossing over the first. He did not tolerate a total contact cast has balance issues 5/171/8. 0.2x0.2x0.2 less depth. Using prisma 03/16/17; no  major change. Still a small probing wound with copious amounts of surrounding callus skin nonviable subcutaneous tissue all of which debrided. Still using Prisma. I suspect the dimensions post debridement are about the same as listed above 0.2 x 0.2 x 0.2 03/23/17; no major change using Prisma. Once again a large amount of callus nonviable subcutaneous tissue around the wound. I went over the offloading issues with the patient and the nurse. He did not tolerate a total contact cast he uses public transportation among other issues. 03/30/17; no major change using Prisma. Once again a large amount of callus and nonviable tissue around the wound requires debridement. Saw Dr. Amalia Hailey who pared calluses on the other foot. 04/06/17; the wound is much smaller there is callus around this although I think I can see the base of the wound and I elected not to do any debridement. 04/13/17 this is a wound I did not debridement last week left callus intact. He arrives today with the area much in the same predicament however this time I remove the callus to find a deep probing hole. This is disappointing this is not go to bone. We have been using Prisma. 04/20/17 plantar left first toe. Again although a lot of circumferential callus around the wound which requires debridement however this does not appear to be a probing area. Culture I did last week show coag-negative staph. I thought this was probably not something that was worth treating but In the interim he was seen by his primary care with parotitis. He is now on clindamycin and Cipro and the Staphylococcus we cultured is Cipro sensitive [lugdunensis} 04/27/17; plantar left first toe. Again a lot of circumferential callus around a small open area. Removing the callus also nonviable subcutaneous tissue. The wound looks better today. He has completed his antibiotics for the parotitis. I didn't think additional antibiotics were necessary in terms of the wound 05/11/17;  plantar first toe. I thought this might be closed this week however once again he has a deep probing small hole. Using pickups and a scalpel debridement of thick  callus and subcutaneous tissue from around the wound reveals a depth of about 0.4 cm. We have not been able to offload the area. There is no obvious infection here. No evidence of ischemia 05/18/17; since his last visit he's been in hospital for oral surgery apparently to remove a retained surgical screw. He has swelling of his jaw and has some discomfort. He arrives today with the plantar left first toe in the same predicament. No major change. Each time he comes in here he has thick adherent callus 05/25/17; arrives today with roughly the same condition of the plantar left great toe. Perhaps somewhat better less callus. There are no options for offloading. He is on his feet a lot and I think this is largely a pressure issue X-ray I did last week showed no evidence of osteomyelitis. He has a toe separator as the great toe tends to ride under the second toe when he is walking. I'm hopeful that this will allow for a little less pressure on the plantar aspect of the left great toe 06/07/2017 -- he is working hard on giving up smoking and other than that has been wearing his diabetic shoes and is also trying to offload as much as possible. 06/15/17; patient arrived in clinic today with a difficult area on his left medial great toe closed over. There is still amount of callus on this however I Jacob [name]'t believe there is any underlying open wound. He has custom shoes with inserts. I still think he is going to need to offload this area going forward. He also follows with Dr. Amalia Hailey of podiatry for routine foot care READMISSION 11/01/16; patient arrives in clinic today with a five-day history of a new area on the previously problematic left great toe. He is wearing his diabetic shoes and he apparently has a new pair coming. The patient is a type II diabetic  with neuropathy. He follows with Dr. Amalia Hailey of podiatry for pre-ulcerative callus on his bilateral feet last seen on 08/24/17. He has developed a large blister on top of his left great toe this is since gone down somewhat. He is not been dressing this Jacob [name], Jacob [name] ([id number]) [id number] Page 4 of 14 specifically. He does not have a history of significant PAD previous ABIs in this clinic were1.1 11/06/17; left great toe has epithelialized since last time. I think this was caused by a insoles an issue pushing the toe against the dorsal aspect of the shoe itself has new diabetic shoes coming hopefully will have more forefoot depth. READMISSION 03/01/2022 This is a now 71 year old man with a past medical history significant for type 2 diabetes mellitus with end-stage renal disease on hemodialysis as as well as congestive heart failure, ischemic cardiomyopathy, hypertension, and prior diabetic foot ulcers. He has had multiple issues with his hemodialysis access. He had a prior left radiocephalic fistula that failed. In October 2020, he had a left brachiobasilic fistula. He apparently underwent a procedure at an outside vascular office on November 09, 2021. Subsequent to that, he developed a wound on his left posterior forearm just distal to the elbow as well as wounds on 2 of his left digits. He was diagnosed with steal syndrome and recently underwent ligation of his fistula with placement of a hemodialysis catheter. He is here today for assistance in healing the wounds that developed related to steal syndrome. He says that he has been applying Neosporin to the sites and they are fairly sensitive to the touch. 03/08/2022: The wounds on  his fingers are much smaller and have just a little bit of accumulated thin eschar. The wound on his left posterior forearm is also smaller but has accumulated both slough and eschar. He also has a thick callus on his right fifth metatarsal head  plantar surface and unfortunately, it appears that there is an open wound underneath the callus. We have been using Iodosorb with Hydrofera Blue to all of his open wounds. Pain is much improved from last week. 03/15/2022: The wounds on his fingers have healed. The wound on his left posterior forearm is smaller and has a nice layer of granulation tissue with just a bit of accumulated slough. He has built up additional callus around the right fifth metatarsal head plantar surface, but the underlying wound has a good surface with granulation tissue present. We have been using Iodosorb with Hydrofera Blue. He did not endorse any pain this week. 03/22/2022: The wound on his left posterior forearm continues to contract. There is good granulation tissue present and just a little bit of periwound eschar and overlying slough. The right fifth metatarsal head wound has once again accumulated some callus and there is slough within the wound. The intake nurse measured a area of undermining from 7-10 o'clock. 03/29/2022: The patient saw podiatry this week for nail trim and they also pared back a number of calluses. The wound on his left posterior forearm is substantially smaller with just some overlying thin eschar. The right fifth metatarsal head has a bit of callus accumulation as well as some slough in the wound bed but is otherwise clean. It is smaller today, as well. 04/05/2022: The wound on his left posterior forearm is nearly closed with just a small amount of overlying eschar. The right fifth metatarsal head wound is smaller again this week. There is some periwound callus accumulation and some slough in the wound bed. He is going to get new dialysis access placed next week. 04/19/2022: The wound on his left posterior forearm is closed. The right fifth metatarsal head wound is about the same size with periwound callus and slough accumulation. He did have a new right forearm PTFE AV graft placed last week. His  right forearm and hand are quite swollen, warm, and red today. He was apparently seen in the emergency room yesterday and they did not think it was infected and attributed the swelling to normal postop findings. 04/28/2022: The wound on his foot has accumulated a lot of periwound callus, but the actual open surface is quite small. There is just a little bit of slough on the surface. 05/03/2022: His wound is even smaller today with less periwound callus accumulation. He continues to build up slough but the underlying tissue is healthy and viable-appearing. 05/10/2022: The orifice of the wound continues to contract, but there is some undermining and periwound callus formation. No significant accumulation of slough. 05/17/2022: The wound is about the same size today, perhaps slightly smaller. He does have periwound callus accumulation, but it does not overlap or create any undermining. There is some slough in the base of the wound. 05/31/2022: The wound measured larger today on intake, but this may be secondary to the fairly aggressive debridement I performed at his last visit. Despite this, he has built up callus around the wound again. There is some epiboly around the orifice. There is slough within the wound and some undermining is present. His blood sugar remains very poorly controlled; it was 300 today. 06/16/2022: No real change in the size of  his wound. He has callus accumulation around the perimeter. Last visit we changed his dressing to Prisma; I have not seen much in the way of filling in from the base yet. 06/30/2022: No real change to his wound. There is some undermining that has been created by accumulated callus. We are struggling with poor glucose control in the setting of immunodeficiency and end-stage renal disease on dialysis. 07/14/2022: The wound has gotten deeper and a probe placed into the wound can be palpated coming to just under the skin on the dorsal aspect of his foot. The wound itself  is fairly clean and there is no malodor or purulent drainage present. He is scheduled to see podiatry next week and hopefully get x-rays performed at that visit. He is also requesting a knee scooter. 07/26/2022: A couple of days after our last visit, he presented to the emergency department with worsening of his wound. Radiographic imaging was consistent with osteomyelitis. He was taken to the operating room by Dr. Loel Lofty of podiatry. He underwent irrigation and debridement of his right foot wound and resection of the right fourth metatarsal head and proximal phalanx. Cultures grew out staph. He is currently receiving IV vancomycin and ceftazidime with hemodialysis. The wound is very clean today. There is no slough buildup. No purulent drainage. It does probe through to the dorsum of his foot to his surgical incision. Sutures are still in place. 08/04/2022: The surgical incision has partially dehisced. There are still Prolene sutures in situ. There is some slough accumulation on the open surface behind the sutures. On the plantar surface, the wound has accumulated a thick periwound callus. There is some undermining present. Electronic Signature(s) Signed: 08/04/2022 1:38:52 PM By: Jacob Maudlin MD FACS Entered By: Jacob [name] on 08/04/2022 13:38:52 Jacob [name] ([id number]) [id number] Page 5 of 14 -------------------------------------------------------------------------------- Physical Exam Details Patient Name: Date of Service: Jacob [name], Jacob [name] 08/04/2022 12:30 PM Medical Record Number: [id number] Patient Account Number: [id number] Date of Birth/Sex: Treating RN: 1950-10-27 (71 y.o. M) Primary Care Provider: Alain [name] Other Clinician: Referring Provider: Treating Provider/Extender: Jacob [name] in Treatment: 22 Constitutional . . . . No acute distress.[name] Respiratory Normal work of breathing on room  air.. Notes 08/04/2022: The surgical incision has partially dehisced. There are still Prolene sutures in situ. There is some slough accumulation on the open surface behind the sutures. On the plantar surface, the wound has accumulated a thick periwound callus. There is some undermining present. Electronic Signature(s) Signed: 08/04/2022 1:40:15 PM By: Jacob Maudlin MD FACS Entered By: Jacob [name] on 08/04/2022 13:40:15 -------------------------------------------------------------------------------- Physician Orders Details Patient Name: Date of Service: Jacob Ache. 08/04/2022 12:30 PM Medical Record Number: [id number] Patient Account Number: [id number] Date of Birth/Sex: Treating RN: December 13, 1950 (71 y.o. Jacob [name] Primary Care Provider: Alain [name] Other Clinician: Referring Provider: Treating Provider/Extender: Jacob [name] in Treatment: 45 Verbal / Phone Orders: No Diagnosis Coding ICD-10 Coding Code Description [phone] Non-pressure chronic ulcer of other part of right foot with fat layer exposed E11.22 Type 2 diabetes mellitus with diabetic chronic kidney disease J44.9 Chronic obstructive pulmonary disease, unspecified I50.42 Chronic combined systolic (congestive) and diastolic (congestive) heart failure I10 Essential (primary) hypertension N25.81 Secondary hyperparathyroidism of renal origin N18.6 End stage renal disease B20 Human immunodeficiency virus [HIV] disease M86.671 Other chronic osteomyelitis, right ankle and foot Follow-up Appointments ppointment in 1 week. - Dr. Celine [name] RM 1 with [name] Return A Anesthetic Wound #8 Right,Plantar  Foot (In clinic) Topical Lidocaine 4% applied to wound bed Bathing/ Shower/ Hygiene Other Bathing/Shower/Hygiene Orders/Instructions: - Change dressing after bathing Off-Loading Other: - minimal weight bearing right foot Home Health dmit to [place] for wound care. May utilize  formulary equivalent dressing for wound treatment orders unless otherwise specified. - A pt has dialysis MWF mornings MOSI, HANNOLD ([id number]) [id number] Page 6 of 14 No change in wound care orders this week; continue Home Health for wound care. May utilize formulary equivalent dressing for wound treatment orders unless otherwise specified. Other Home Health Orders/Instructions: - [facility] Wound Treatment Wound #8 - Foot Wound Laterality: Plantar, Right Cleanser: Soap and Water 3 x Per Week/30 Days Discharge Instructions: May shower and wash wound with dial antibacterial soap and water prior to dressing change. Cleanser: Wound Cleanser (Generic) 3 x Per Week/30 Days Discharge Instructions: Cleanse the wound with wound cleanser prior to applying a clean dressing using gauze sponges, not tissue or cotton balls. Prim Dressing: Iodoform packing strip 1/2 (in) 3 x Per Week/30 Days ary Discharge Instructions: Lightly pack as instructed Secondary Dressing: Optifoam Non-Adhesive Dressing, 4x4 in 3 x Per Week/30 Days Discharge Instructions: Apply over primary dressing cut to make foam donut Secondary Dressing: Woven Gauze Sponges 2x2 in 3 x Per Week/30 Days Discharge Instructions: Apply over primary dressing as directed. Secured With: Elastic Bandage 4 inch (ACE bandage) 3 x Per Week/30 Days Discharge Instructions: Secure with ACE bandage as directed. Secured With: 80M Medipore H Soft Cloth Surgical T ape, 4 x 10 (in/yd) 3 x Per Week/30 Days Discharge Instructions: Secure with tape as directed. Wound #9 - Foot Wound Laterality: Dorsal, Right Prim Dressing: Gauze Non-Woven Sterile Sponge 4x4 (in/in) ary 3 x Per Week/30 Days Secured With: [employer], 4.5x3.1 (in/yd) 3 x Per Week/30 Days Discharge Instructions: Secure with Kerlix as directed. Electronic Signature(s) Signed: 08/04/2022 1:45:39 PM By: Jacob Maudlin MD FACS Entered By: Jacob [name] on  08/04/2022 13:41:02 -------------------------------------------------------------------------------- Problem List Details Patient Name: Date of Service: Jacob Ache. 08/04/2022 12:30 PM Medical Record Number: [id number] Patient Account Number: [id number] Date of Birth/Sex: Treating RN: 10-12-1951 (71 y.o. Jacob [name] Primary Care Provider: Alain [name] Other Clinician: Referring Provider: Treating Provider/Extender: Jacob [name] in Treatment: 89 Active Problems ICD-10 Encounter Code Description Active Date MDM Diagnosis [phone] Non-pressure chronic ulcer of other part of right foot with fat layer exposed 03/08/2022 No Yes E11.22 Type 2 diabetes mellitus with diabetic chronic kidney disease 03/01/2022 No Yes J44.9 Chronic obstructive pulmonary disease, unspecified 03/01/2022 No Yes I50.42 Chronic combined systolic (congestive) and diastolic (congestive) heart failure 03/01/2022 No Yes RANDI, COLLEGE ([id number]) [id number] Page 7 of 14 I10 Essential (primary) hypertension 03/01/2022 No Yes N25.81 Secondary hyperparathyroidism of renal origin 03/01/2022 No Yes N18.6 End stage renal disease 03/01/2022 No Yes B20 Human immunodeficiency virus [HIV] disease 03/01/2022 No Yes M86.671 Other chronic osteomyelitis, right ankle and foot 07/26/2022 No Yes Inactive Problems Resolved Problems ICD-10 Code Description Active Date Resolved Date L98.499 Non-pressure chronic ulcer of skin of other sites with unspecified severity 03/01/2022 03/01/2022 Electronic Signature(s) Signed: 08/04/2022 1:37:15 PM By: Jacob Maudlin MD FACS Entered By: Jacob [name] on 08/04/2022 13:37:15 -------------------------------------------------------------------------------- Progress Note Details Patient Name: Date of Service: Jacob Ache. 08/04/2022 12:30 PM Medical Record Number: [id number] Patient Account Number: [id number] Date of Birth/Sex:  Treating RN: 1951/03/11 (71 y.o. M) Primary Care Provider: Alain [name] Other Clinician: Referring Provider: Treating Provider/Extender: Anson Fret  Weeks in Treatment: 22 Subjective Chief Complaint Information obtained from Patient Patient seen for complaints of Non-Healing Wounds x 3 History of Present Illness (HPI) The following HPI elements were documented for the patient's wound: Location: 2 ulcers noted to the left hallux Quality: the patient denies pain Duration: these were originally blisters towards the end of February and opened in early March Context: these wounds are secondary to pressure; he got new shoes in February and also started wearing a toe cover/sleeve Modifying Factors: pressure and friction are exacerbating factor 01/12/17- Mr. Platten arrives for initial evaluation of 2 ulcers to his left hallux. He states that back in February he did receive a new pair of diabetic shoes and at that time he started wearing toe cover/sleeves and noticed development of a blister to the dorsal and plantar aspect. He has been seen at [facility] by both Dr. Amalia Hailey and Dr. Jacqualyn Posey. He last saw Dr.Wagoner on 3/8 at which time he was prescribed Silvadene and Keflex. He completed that dose of Keflex and states that he got a phone call to extend the Keflex prior to this appointment. He has a follow-up with Dr. Amalia Hailey on 3/26. He has been wearing a surgical shoe, no significant offloading to the plantar/medial ulcer but the strap does not extend over the dorsal ulcer. T opically he has been applying the Silvadene daily and a dry dressing. He is a diabetic, on insulin, with a recent A1c of 6.2% (11/10/16). He is a current smoker, 0.5 PPD. He has no known diagnosis of arterial insufficiency. 01/20/17- The patient arrives for follow up evaluation of his ulcers to the left hallux. He admits to continuing to smoke approximately 0.5ppd. He states that his Jacob [name], Jacob [name]  ([id number]) [id number] Page 8 of 14 diet has been somewhat uncontrolled with higher than normal glucose levels, this seems circumstantial as he had a death in the family as food and schedule has been disrupted. he admits to wearing the surgical show with offloading modifications while out of the home, but has been ambulating barefoot around the home. he saw Dr. Amalia Hailey yesterday for toenail trimming and callus shaving to the right plantar foot and investigation of his ulcers. 01/26/17- he is here for follow-up evaluation of his left hallux ulcers. He states he has been significantly more compliant in the use of his offloading surgical shoe, with the exception of "getting up for a glass of water" he states that he has worn it with any ambulation. He states he has decreased his smoking too, admitting to only a cigarette since yesterday. He states his blood sugar this morning was 147 and yesterday was 116. He is voicing no complaints or concerns 02/02/17- He is here for follow-up evaluation of his left hallux ulcers. The dorsal ulcer is crusted over/healed. He states he has minimized his ambulation over this past week, much less than the previous week. He states his glucose levels have been between 120-150 about half of the time, otherwise greater than 150. He cannot correlate any specific dietary changes. He does have an appointment with a dietitian next month. He was advised to keep a log of his glucose levels and his diet for that appointment. 02/06/17; patient comes in today for first application of the total contact cast. Her intake nurse reported the wounds look better therefore I did not actually see these before application of the cast. He'll return on Thursday for reapplication in the standard fashion 02/10/27- he is here for follow-up evaluation of his  left plantar hallux ulcer. He came in Monday for application of a total contact cast. He states that he was tolerating that  Monday, but noticed on Tuesday that his foot began to slide in the cast. He also is complaining of the heaviness of the cast, as he ambulates as his primary form of transportation. He continues to smoke,. He has not contacted his cardiologist regarding Nicorette gum. He states his blood sugars have been slightly elevated, this morning being 206. He states that he will complete an antibiotic tonight. He states this was prescribed by Dr. Jacqualyn Posey. He states that he has refilled this prescription twice, without direction to continue taking this medication. 02/16/17; patient wound about the same as last week per her description. Rolled edges of callus scan and nonviable subcutaneous tissue. Using Prisma. Apparently used topical antibiotic instead of the Prisma over the course of this week. He has not a total contact cast candidate secondary to ambulation needs and unsteadiness on his feet per discussion with our intake nurse 03/02/17 patient requires debridement. He has a small wound on the right great toe which probes however does not approach bone. What I can see of the base of this wound looks reasonably healthy and I think this is largely in offloading issue with the second toe crossing over the first. He did not tolerate a total contact cast has balance issues 5/171/8. 0.2x0.2x0.2 less depth. Using prisma 03/16/17; no major change. Still a small probing wound with copious amounts of surrounding callus skin nonviable subcutaneous tissue all of which debrided. Still using Prisma. I suspect the dimensions post debridement are about the same as listed above 0.2 x 0.2 x 0.2 03/23/17; no major change using Prisma. Once again a large amount of callus nonviable subcutaneous tissue around the wound. I went over the offloading issues with the patient and the nurse. He did not tolerate a total contact cast he uses public transportation among other issues. 03/30/17; no major change using Prisma. Once again a large  amount of callus and nonviable tissue around the wound requires debridement. Saw Dr. Amalia Hailey who pared calluses on the other foot. 04/06/17; the wound is much smaller there is callus around this although I think I can see the base of the wound and I elected not to do any debridement. 04/13/17 this is a wound I did not debridement last week left callus intact. He arrives today with the area much in the same predicament however this time I remove the callus to find a deep probing hole. This is disappointing this is not go to bone. We have been using Prisma. 04/20/17 plantar left first toe. Again although a lot of circumferential callus around the wound which requires debridement however this does not appear to be a probing area. Culture I did last week show coag-negative staph. I thought this was probably not something that was worth treating but In the interim he was seen by his primary care with parotitis. He is now on clindamycin and Cipro and the Staphylococcus we cultured is Cipro sensitive [lugdunensis} 04/27/17; plantar left first toe. Again a lot of circumferential callus around a small open area. Removing the callus also nonviable subcutaneous tissue. The wound looks better today. He has completed his antibiotics for the parotitis. I didn't think additional antibiotics were necessary in terms of the wound 05/11/17; plantar first toe. I thought this might be closed this week however once again he has a deep probing small hole. Using pickups and a scalpel debridement of  thick callus and subcutaneous tissue from around the wound reveals a depth of about 0.4 cm. We have not been able to offload the area. There is no obvious infection here. No evidence of ischemia 05/18/17; since his last visit he's been in hospital for oral surgery apparently to remove a retained surgical screw. He has swelling of his jaw and has some discomfort. He arrives today with the plantar left first toe in the same predicament. No  major change. Each time he comes in here he has thick adherent callus 05/25/17; arrives today with roughly the same condition of the plantar left great toe. Perhaps somewhat better less callus. There are no options for offloading. He is on his feet a lot and I think this is largely a pressure issue X-ray I did last week showed no evidence of osteomyelitis. He has a toe separator as the great toe tends to ride under the second toe when he is walking. I'm hopeful that this will allow for a little less pressure on the plantar aspect of the left great toe 06/07/2017 -- he is working hard on giving up smoking and other than that has been wearing his diabetic shoes and is also trying to offload as much as possible. 06/15/17; patient arrived in clinic today with a difficult area on his left medial great toe closed over. There is still amount of callus on this however I Jacob [name]'t believe there is any underlying open wound. He has custom shoes with inserts. I still think he is going to need to offload this area going forward. He also follows with Dr. Amalia Hailey of podiatry for routine foot care READMISSION 11/01/16; patient arrives in clinic today with a five-day history of a new area on the previously problematic left great toe. He is wearing his diabetic shoes and he apparently has a new pair coming. The patient is a type II diabetic with neuropathy. He follows with Dr. Amalia Hailey of podiatry for pre-ulcerative callus on his bilateral feet last seen on 08/24/17. He has developed a large blister on top of his left great toe this is since gone down somewhat. He is not been dressing this specifically. He does not have a history of significant PAD previous ABIs in this clinic were1.1 11/06/17; left great toe has epithelialized since last time. I think this was caused by a insoles an issue pushing the toe against the dorsal aspect of the shoe itself has new diabetic shoes coming hopefully will have more forefoot  depth. READMISSION 03/01/2022 This is a now 71 year old man with a past medical history significant for type 2 diabetes mellitus with end-stage renal disease on hemodialysis as as well as congestive heart failure, ischemic cardiomyopathy, hypertension, and prior diabetic foot ulcers. He has had multiple issues with his hemodialysis access. He had a prior left radiocephalic fistula that failed. In October 2020, he had a left brachiobasilic fistula. He apparently underwent a procedure at an outside vascular office on November 09, 2021. Subsequent to that, he developed a wound on his left posterior forearm just distal to the elbow as well as wounds on 2 of his left digits. He was diagnosed with steal syndrome and recently underwent ligation of his fistula with placement of a hemodialysis catheter. He is here today for assistance in healing the wounds that developed related to steal syndrome. He says that he has been applying Neosporin to the sites and they are fairly sensitive to the touch. 03/08/2022: The wounds on his fingers are much smaller and have  just a little bit of accumulated thin eschar. The wound on his left posterior forearm is also smaller but has accumulated both slough and eschar. He also has a thick callus on his right fifth metatarsal head plantar surface and unfortunately, it appears that there is an open wound underneath the callus. We have been using Iodosorb with Hydrofera Blue to all of his open wounds. Pain is much improved from last week. 03/15/2022: The wounds on his fingers have healed. The wound on his left posterior forearm is smaller and has a nice layer of granulation tissue with just a bit of accumulated slough. He has built up additional callus around the right fifth metatarsal head plantar surface, but the underlying wound has a good surface with granulation tissue present. We have been using Iodosorb with Hydrofera Blue. He did not endorse any pain this week. 03/22/2022:  The wound on his left posterior forearm continues to contract. There is good granulation tissue present and just a little bit of periwound eschar and overlying slough. The right fifth metatarsal head wound has once again accumulated some callus and there is slough within the wound. The intake nurse measured a area of undermining from 7-10 o'clock. Jacob [name], Jacob [name] ([id number]) [id number] Page 9 of 14 03/29/2022: The patient saw podiatry this week for nail trim and they also pared back a number of calluses. The wound on his left posterior forearm is substantially smaller with just some overlying thin eschar. The right fifth metatarsal head has a bit of callus accumulation as well as some slough in the wound bed but is otherwise clean. It is smaller today, as well. 04/05/2022: The wound on his left posterior forearm is nearly closed with just a small amount of overlying eschar. The right fifth metatarsal head wound is smaller again this week. There is some periwound callus accumulation and some slough in the wound bed. He is going to get new dialysis access placed next week. 04/19/2022: The wound on his left posterior forearm is closed. The right fifth metatarsal head wound is about the same size with periwound callus and slough accumulation. He did have a new right forearm PTFE AV graft placed last week. His right forearm and hand are quite swollen, warm, and red today. He was apparently seen in the emergency room yesterday and they did not think it was infected and attributed the swelling to normal postop findings. 04/28/2022: The wound on his foot has accumulated a lot of periwound callus, but the actual open surface is quite small. There is just a little bit of slough on the surface. 05/03/2022: His wound is even smaller today with less periwound callus accumulation. He continues to build up slough but the underlying tissue is healthy and viable-appearing. 05/10/2022: The  orifice of the wound continues to contract, but there is some undermining and periwound callus formation. No significant accumulation of slough. 05/17/2022: The wound is about the same size today, perhaps slightly smaller. He does have periwound callus accumulation, but it does not overlap or create any undermining. There is some slough in the base of the wound. 05/31/2022: The wound measured larger today on intake, but this may be secondary to the fairly aggressive debridement I performed at his last visit. Despite this, he has built up callus around the wound again. There is some epiboly around the orifice. There is slough within the wound and some undermining is present. His blood sugar remains very poorly controlled; it was 300 today. 06/16/2022: No real change in the  size of his wound. He has callus accumulation around the perimeter. Last visit we changed his dressing to Prisma; I have not seen much in the way of filling in from the base yet. 06/30/2022: No real change to his wound. There is some undermining that has been created by accumulated callus. We are struggling with poor glucose control in the setting of immunodeficiency and end-stage renal disease on dialysis. 07/14/2022: The wound has gotten deeper and a probe placed into the wound can be palpated coming to just under the skin on the dorsal aspect of his foot. The wound itself is fairly clean and there is no malodor or purulent drainage present. He is scheduled to see podiatry next week and hopefully get x-rays performed at that visit. He is also requesting a knee scooter. 07/26/2022: A couple of days after our last visit, he presented to the emergency department with worsening of his wound. Radiographic imaging was consistent with osteomyelitis. He was taken to the operating room by Dr. Loel Lofty of podiatry. He underwent irrigation and debridement of his right foot wound and resection of the right fourth metatarsal head and proximal  phalanx. Cultures grew out staph. He is currently receiving IV vancomycin and ceftazidime with hemodialysis. The wound is very clean today. There is no slough buildup. No purulent drainage. It does probe through to the dorsum of his foot to his surgical incision. Sutures are still in place. 08/04/2022: The surgical incision has partially dehisced. There are still Prolene sutures in situ. There is some slough accumulation on the open surface behind the sutures. On the plantar surface, the wound has accumulated a thick periwound callus. There is some undermining present. Patient History Information obtained from Patient. Family History Diabetes - Siblings,Father, Heart Disease - Father,Siblings, Hypertension - Father,Siblings, No family history of Cancer, Hereditary Spherocytosis, Kidney Disease, Lung Disease, Seizures, Stroke, Thyroid Problems, Tuberculosis. Social History Current every day smoker - 1/2 ppd / 58yr, Marital Status - Single, Alcohol Use - Moderate - 6 standard drinks per week, Drug Use - Prior History - Polysubstance Abuse; clean for 9 years, Caffeine Use - Rarely - tea. Medical History Eyes Patient has history of Cataracts - mild, Glaucoma Denies history of Optic Neuritis Ear/Nose/Mouth/Throat Denies history of Chronic sinus problems/congestion, Middle ear problems Hematologic/Lymphatic Patient has history of Anemia Denies history of Hemophilia, Human Immunodeficiency Virus, Lymphedema, Sickle Cell Disease Respiratory Patient has history of Chronic Obstructive Pulmonary Disease (COPD) Denies history of Aspiration, Asthma, Pneumothorax, Sleep Apnea, Tuberculosis Cardiovascular Patient has history of Congestive Heart Failure, Coronary Artery Disease, Hypertension, Myocardial Infarction - 2004 Denies history of Angina, Arrhythmia, Deep Vein Thrombosis, Hypotension, Peripheral Arterial Disease, Peripheral Venous Disease, Phlebitis, Vasculitis Gastrointestinal Patient has  history of Hepatitis C - 1968 Denies history of Cirrhosis , Colitis, Crohnoos, Hepatitis A, Hepatitis B Endocrine Patient has history of Type II Diabetes Denies history of Type I Diabetes Genitourinary Denies history of End Stage Renal Disease Immunological Denies history of Lupus Erythematosus, Raynaudoos, Scleroderma Integumentary (Skin) Denies history of History of Burn Musculoskeletal Patient has history of Gout, Osteoarthritis Denies history of Rheumatoid Arthritis, Osteomyelitis Neurologic Patient has history of Neuropathy Denies history of Dementia, Quadriplegia, Paraplegia, Seizure Disorder Jacob [name], Jacob [name]([id number] Page 10 of 14 Oncologic Denies history of Received Chemotherapy, Received Radiation Psychiatric Denies history of Anorexia/bulimia, Confinement Anxiety Hospitalization/Surgery History - Colonoscopy. - CABG. - Laproscopic Cholecystectomy. - Left Hip Fx. - Left Jaw Fx. - Lumbar Laminectomy. Medical A Surgical History Notes nd Constitutional Symptoms (General Health) Insomnia  Syncope Alcohol Dependence HIV Hematologic/Lymphatic Hyperlipidemia Hyperglycemia Cardiovascular CABG, ischemic cardiomyopathy Gastrointestinal GERD Genitourinary Syphilis CKD,Nocturia Immunological HIV Musculoskeletal Hip Fx BiLateral Bunions Unspecified Arthritis Neurologic chronic back pain Objective Constitutional No acute distress.. Vitals Time Taken: 12:48 PM, Height: 74 in, Weight: 172 lbs, BMI: 22.1, Temperature: 97.7 F, Pulse: 78 bpm, Respiratory Rate: 18 breaths/min, Blood Pressure: 116/72 mmHg, Capillary Blood Glucose: 200 mg/dl. General Notes: glucose per pt report this am Respiratory Normal work of breathing on room air.. General Notes: 08/04/2022: The surgical incision has partially dehisced. There are still Prolene sutures in situ. There is some slough accumulation on the open surface behind the sutures. On the  plantar surface, the wound has accumulated a thick periwound callus. There is some undermining present. Integumentary (Hair, Skin) Wound #8 status is Open. Original cause of wound was Blister. The date acquired was: 03/08/2022. The wound has been in treatment 21 weeks. The wound is located on the [place]. The wound measures 0.8cm length x 0.4cm width x 3cm depth; 0.251cm^2 area and 0.754cm^3 volume. There is Fat Layer (Subcutaneous Tissue) exposed. There is no tunneling noted, however, there is undermining starting at 6:00 and ending at 9:00 with a maximum distance of 1cm. There is a medium amount of serosanguineous drainage noted. The wound margin is thickened. There is large (67-100%) red granulation within the wound bed. There is a small (1-33%) amount of necrotic tissue within the wound bed including Adherent Slough. The periwound skin appearance had no abnormalities noted for moisture. The periwound skin appearance had no abnormalities noted for color. The periwound skin appearance exhibited: Callus. Periwound temperature was noted as No Abnormality. The periwound has tenderness on palpation. Wound #9 status is Open. Original cause of wound was Surgical Injury. The date acquired was: 07/21/2022. The wound has been in treatment 1 weeks. The wound is located on the Right,Dorsal Foot. The wound measures 0.8cm length x 0.3cm width x 0.2cm depth; 0.188cm^2 area and 0.038cm^3 volume. There is Fat Layer (Subcutaneous Tissue) exposed. There is no tunneling or undermining noted. There is a medium amount of serosanguineous drainage noted. The wound margin is distinct with the outline attached to the wound base. There is large (67-100%) red granulation within the wound bed. There is a small (1-33%) amount of necrotic tissue within the wound bed including Adherent Slough. The periwound skin appearance had no abnormalities noted for texture. The periwound skin appearance had no abnormalities noted for  moisture. The periwound skin appearance had no abnormalities noted for color. Periwound temperature was noted as No Abnormality. The periwound has tenderness on palpation. 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 Chronic obstructive pulmonary disease, unspecified Chronic combined systolic (congestive) and diastolic (congestive) heart failure Essential (primary) hypertension Secondary hyperparathyroidism of renal origin End stage renal disease Human immunodeficiency virus [HIV] disease Other chronic osteomyelitis, right ankle and foot Jacob [name], Jacob [name] ([id number]) [id number] Page 11 of 14 Procedures Wound #8 Pre-procedure diagnosis of Wound #8 is a Diabetic Wound/Ulcer of the Lower Extremity located on the Right,Plantar Foot .Severity of Tissue Pre Debridement is: Fat layer exposed. There was a Excisional Skin/Subcutaneous Tissue Debridement with a total area of 1 sq cm performed by Jacob Maudlin, MD. With the following instrument(s): Curette to remove Viable and Non-Viable tissue/material. Material removed includes Callus, Subcutaneous Tissue, Slough, and Skin: Epidermis. No specimens were taken. A time out was conducted at 13:15, prior to the  start of the procedure. A Minimum amount of bleeding was controlled with Pressure. The procedure was tolerated well with a pain level of 0 throughout and a pain level of 0 following the procedure. Post Debridement Measurements: 0.8cm length x 0.8cm width x 3cm depth; 1.508cm^3 volume. Character of Wound/Ulcer Post Debridement is improved. Severity of Tissue Post Debridement is: Fat layer exposed. Post procedure Diagnosis Wound #8: Same as Pre-Procedure General Notes: scribed by Jacob Gouty, RN for Dr. Celine [name]. Wound #9 Pre-procedure diagnosis of Wound #9 is an Open Surgical Wound located  on the Right,Dorsal Foot .Severity of Tissue Pre Debridement is: Fat layer exposed. There was a Selective/Open Wound Non-Viable Tissue Debridement with a total area of 0.24 sq cm performed by Jacob Maudlin, MD. With the following instrument(s): Curette to remove Non-Viable tissue/material. Material removed includes [facility]. No specimens were taken. A time out was conducted at 13:15, prior to the start of the procedure. A Minimum amount of bleeding was controlled with Pressure. The procedure was tolerated well with a pain level of 0 throughout and a pain level of 0 following the procedure. Post Debridement Measurements: 0.8cm length x 0.3cm width x 0.2cm depth; 0.038cm^3 volume. Character of Wound/Ulcer Post Debridement is improved. Severity of Tissue Post Debridement is: Fat layer exposed. Post procedure Diagnosis Wound #9: Same as Pre-Procedure General Notes: scribed by Jacob Gouty, RN for Dr. Celine [name]. Plan Follow-up Appointments: Return Appointment in 1 week. - Dr. Celine [name] RM 1 with [name] Anesthetic: Wound #8 Right,Plantar Foot: (In clinic) Topical Lidocaine 4% applied to wound bed Bathing/ Shower/ Hygiene: Other Bathing/Shower/Hygiene Orders/Instructions: - Change dressing after bathing Off-Loading: Other: - minimal weight bearing right foot Home Health: Admit to [facility] for wound care. May utilize formulary equivalent dressing for wound treatment orders unless otherwise specified. - pt has dialysis MWF mornings No change in wound care orders this week; continue Home Health for wound care. May utilize formulary equivalent dressing for wound treatment orders unless otherwise specified. Other Home Health Orders/Instructions: - [facility] WOUND #8: - Foot Wound Laterality: Plantar, Right Cleanser: Soap and Water 3 x Per Week/30 Days Discharge Instructions: May shower and wash wound with dial antibacterial soap and water prior to dressing change. Cleanser: Wound Cleanser (Generic) 3 x  Per Week/30 Days Discharge Instructions: Cleanse the wound with wound cleanser prior to applying a clean dressing using gauze sponges, not tissue or cotton balls. Prim Dressing: Iodoform packing strip 1/2 (in) 3 x Per Week/30 Days ary Discharge Instructions: Lightly pack as instructed Secondary Dressing: Optifoam Non-Adhesive Dressing, 4x4 in 3 x Per Week/30 Days Discharge Instructions: Apply over primary dressing cut to make foam donut Secondary Dressing: Woven Gauze Sponges 2x2 in 3 x Per Week/30 Days Discharge Instructions: Apply over primary dressing as directed. Secured With: Elastic Bandage 4 inch (ACE bandage) 3 x Per Week/30 Days Discharge Instructions: Secure with ACE bandage as directed. Secured With: 24M Medipore H Soft Cloth Surgical T ape, 4 x 10 (in/yd) 3 x Per Week/30 Days Discharge Instructions: Secure with tape as directed. WOUND #9: - Foot Wound Laterality: Dorsal, Right Prim Dressing: Gauze Non-Woven Sterile Sponge 4x4 (in/in) 3 x Per Week/30 Days ary Secured With: Kerlix Roll Sterile, 4.5x3.1 (in/yd) 3 x Per Week/30 Days Discharge Instructions: Secure with Kerlix as directed. 08/04/2022: The surgical incision has partially dehisced. There are still Prolene sutures in situ. There is some slough accumulation on the open surface behind the sutures. On the plantar surface, the wound has accumulated a thick periwound callus. There is  some undermining present. I used a curette to debride slough from the dorsal foot wound. I debrided slough, callus, skin, and nonviable subcutaneous tissue from the plantar foot wound. We will continue to pack the site with iodoform packing strips. His sutures are scheduled to be removed in 2 weeks by Dr. Loel Lofty. He will follow-up with me in 1 week. Electronic Signature(s) Signed: 08/04/2022 1:42:14 PM By: Jacob Maudlin MD FACS Entered By: Jacob [name] on 08/04/2022 13:42:14 Jacob [name] ([id number])  [id number] Page 12 of 14 -------------------------------------------------------------------------------- HxROS Details Patient Name: Date of Service: Jacob [name], Jacob [name] 08/04/2022 12:30 PM Medical Record Number: [id number] Patient Account Number: [id number] Date of Birth/Sex: Treating RN: April 29, 1951 (71 y.o. M) Primary Care Provider: Alain [name] Other Clinician: Referring Provider: Treating Provider/Extender: Jacob [name] in Treatment: 73 Information Obtained From Patient Constitutional Symptoms (General Health) Medical History: Past Medical History Notes: Insomnia Syncope Alcohol Dependence HIV Eyes Medical History: Positive for: Cataracts - mild; Glaucoma Negative for: Optic Neuritis Ear/Nose/Mouth/Throat Medical History: Negative for: Chronic sinus problems/congestion; Middle ear problems Hematologic/Lymphatic Medical History: Positive for: Anemia Negative for: Hemophilia; Human Immunodeficiency Virus; Lymphedema; Sickle Cell Disease Past Medical History Notes: Hyperlipidemia Hyperglycemia Respiratory Medical History: Positive for: Chronic Obstructive Pulmonary Disease (COPD) Negative for: Aspiration; Asthma; Pneumothorax; Sleep Apnea; Tuberculosis Cardiovascular Medical History: Positive for: Congestive Heart Failure; Coronary Artery Disease; Hypertension; Myocardial Infarction - 2004 Negative for: Angina; Arrhythmia; Deep Vein Thrombosis; Hypotension; Peripheral Arterial Disease; Peripheral Venous Disease; Phlebitis; Vasculitis Past Medical History Notes: CABG, ischemic cardiomyopathy Gastrointestinal Medical History: Positive for: Hepatitis C - 1968 Negative for: Cirrhosis ; Colitis; Crohns; Hepatitis A; Hepatitis B Past Medical History Notes: GERD Endocrine Medical History: Positive for: Type II Diabetes Negative for: Type I Diabetes Time with diabetes: 13 yrs Treated with: Insulin Blood sugar  tested every day: Yes Tested : 2 times per day Blood sugar testing results: Breakfast: 100; Dinner: 95-126 Jacob [name], Jacob [name] ([id number]) [id number] Page 13 of 14 Genitourinary Medical History: Negative for: End Stage Renal Disease Past Medical History Notes: Syphilis CKD,Nocturia Immunological Medical History: Negative for: Lupus Erythematosus; Raynauds; Scleroderma Past Medical History Notes: HIV Integumentary (Skin) Medical History: Negative for: History of Burn Musculoskeletal Medical History: Positive for: Gout; Osteoarthritis Negative for: Rheumatoid Arthritis; Osteomyelitis Past Medical History Notes: Hip Fx BiLateral Bunions Unspecified Arthritis Neurologic Medical History: Positive for: Neuropathy Negative for: Dementia; Quadriplegia; Paraplegia; Seizure Disorder Past Medical History Notes: chronic back pain Oncologic Medical History: Negative for: Received Chemotherapy; Received Radiation Psychiatric Medical History: Negative for: Anorexia/bulimia; Confinement Anxiety HBO Extended History Items Eyes: Eyes: Cataracts Glaucoma Immunizations Pneumococcal Vaccine: Received Pneumococcal Vaccination: Yes Received Pneumococcal Vaccination On or After 60th Birthday: Yes Implantable Devices No devices added Hospitalization / Surgery History Type of Hospitalization/Surgery Colonoscopy CABG Laproscopic Cholecystectomy Left Hip Fx Left Jaw Fx Lumbar Laminectomy Family and Social History Cancer: No; Diabetes: Yes - Siblings,Father; Heart Disease: Yes - Father,Siblings; Hereditary Spherocytosis: No; Hypertension: Yes - Father,Siblings; Kidney Disease: No; Lung Disease: No; Seizures: No; Stroke: No; Thyroid Problems: No; Tuberculosis: No; Current every day smoker - 1/2 ppd / 62yr; Marital Status - Single; Alcohol Use: Moderate - 6 standard drinks per week; Drug Use: Prior History - Polysubstance Abuse; clean for 9 years; Caffeine  Use: Rarely - tea; Financial Concerns: No; Food, Clothing or Shelter Needs: No; Support System Lacking: No; Transportation Concerns: No Electronic Signature(s) Signed: 08/04/2022 1:45:39 PM By: CFredirick MaudlinMD FACS NRobie [name]([id number] Page 14 of 14 Entered By: CFredirick [name]  on 08/04/2022 13:39:51 -------------------------------------------------------------------------------- SuperBill Details Patient Name: Date of Service: Jacob [name], Jacob [name] 08/04/2022 Medical Record Number: [id number] Patient Account Number: [id number] Date of Birth/Sex: Treating RN: 1951/02/22 (71 y.o. M) Primary Care Provider: Alain [name] Other Clinician: Referring Provider: Treating Provider/Extender: Jacob [name] in Treatment: 22 Diagnosis Coding ICD-10 Codes Code Description [phone] Non-pressure chronic ulcer of other part of right foot with fat layer exposed E11.22 Type 2 diabetes mellitus with diabetic chronic kidney disease J44.9 Chronic obstructive pulmonary disease, unspecified I50.42 Chronic combined systolic (congestive) and diastolic (congestive) heart failure I10 Essential (primary) hypertension N25.81 Secondary hyperparathyroidism of renal origin N18.6 End stage renal disease B20 Human immunodeficiency virus [HIV] disease M86.671 Other chronic osteomyelitis, right ankle and foot Facility Procedures : 3 CPT4 Code: 3825053 Description: 11042 - DEB SUBQ TISSUE 20 SQ CM/< ICD-10 Diagnosis Description L97.512 Non-pressure chronic ulcer of other part of right foot with fat layer exposed Modifier: Quantity: 1 : 7 CPT4 Code: 9767341 Description: 93790 - DEBRIDE WOUND 1ST 20 SQ CM OR < ICD-10 Diagnosis Description L97.512 Non-pressure chronic ulcer of other part of right foot with fat layer exposed Modifier: Quantity: 1 Physician Procedures : CPT4 Code Description Modifier [id number] 32992 - WC PHYS LEVEL 3 - EST PT 25  ICD-10 Diagnosis Description L97.512 Non-pressure chronic ulcer of other part of right foot with fat layer exposed M86.671 Other chronic osteomyelitis, right ankle and foot  I50.42 Chronic combined systolic (congestive) and diastolic (congestive) heart failure B20 Human immunodeficiency virus [HIV] disease Quantity: 1 : [id number] 11042 - WC PHYS SUBQ TISS 20 SQ CM ICD-10 Diagnosis Description L97.512 Non-pressure chronic ulcer of other part of right foot with fat layer exposed Quantity: 1 : [id number] 98921 - WC PHYS DEBR WO ANESTH 20 SQ CM ICD-10 Diagnosis Description L97.512 Non-pressure chronic ulcer of other part of right foot with fat layer exposed Quantity: 1 Electronic Signature(s) Signed: 08/04/2022 1:42:47 PM By: Jacob Maudlin MD FACS Entered By: Jacob [name] on 08/04/2022 13:42:47

## 2022-08-04 NOTE — Patient Outreach (Signed)
  Care Coordination   Follow Up Visit Note   08/04/2022 Name: Jacob [name] MRN: [id number] DOB: 1951/10/18  Jacob [name] is a 71 y.o. year old male who sees [name], MD for primary care. Incoming call from patient. I spoke with  [name] by phone today.  What matters to the patients health and wellness today?  Patient reports that he followed up with PCP office regarding pain med refill. Stets he was told that he could not get refill until Nov 30th when he has next appt. Patient stets he s unsure why and unabel to get clear answer. He reprots that next apt at [facility] was schuedled withone another proider as Dr. Edythe Lynn only in the office on Wed. Patien goses to [facility] on wednesady but has vocied tho staff that he wuld still be able to see assigned provider after HD txs but unable to get in with him pittn sttes he frustrated. Advised that RN CM had snt message yesterdy to provider but did nto hear nythign back. Offered to call office for paitetn to inquire furhter about what's going on.  rNCM made outreach attempt-left message requesting follow up on painmed issue. Advised patient that [place] CM wiudl update him with any new info.     Goals Addressed   None     SDOH assessments and interventions completed:  Yes     Care Coordination Interventions Activated:  Yes  Care Coordination Interventions:  Yes, provided   Follow up plan:  Follow up appt with RN CM already scheduled.     Encounter Outcome:  Pt. Visit Completed   Enzo Montgomery, RN,BSN,CCM [facility] Telephonic Care Management Coordinator Direct Phone: [phone] Toll Free: [phone] Fax: [phone]

## 2022-08-04 NOTE — Progress Notes (Signed)
Patient Active Problem List   Diagnosis Date Noted   Helicobacter pylori (H. pylori) infection 04/06/2021    Priority: High   Human immunodeficiency virus (HIV) disease ([place]) 09/02/2006    Priority: High   Diabetic infection of right foot ([place]) 07/18/2022   Chronic pain syndrome 03/29/2022   Chronic lower back pain    Thrombocytopenia (HCC)    Hypercalcemia 04/13/2021   Lumbar foraminal stenosis 03/30/2021   Gastric hemorrhage due to angiodysplasia of stomach    GI bleed 11/24/2020   COVID-19 virus infection 11/24/2020   Other mechanical complication of cranial or spinal infusion catheter, sequela 11/11/2020   Subdural hematoma ([place]) 11/06/2020   Fall at home, initial encounter 11/05/2020   Nicotine dependence, cigarettes, uncomplicated 93/81/0175   Alcohol abuse 11/05/2020   Unspecified trochanteric fracture of left femur, initial encounter for closed fracture ([place]) 11/05/2020   Traumatic subdural hematoma, initial encounter ([place]) 11/05/2020   Fracture of metatarsal of right foot, closed 11/05/2020   Nondisplaced fracture of greater trochanter of left femur, initial encounter for closed fracture ([place])    Uremia    Angiodysplasia of intestine with hemorrhage    Pruritus, unspecified 10/02/2020   Iron deficiency anemia    Acute blood loss anemia    Acute on chronic anemia    Abnormal nuclear stress test 12/19/2019   Type 2 diabetes mellitus with diabetic neuropathy, unspecified ([place]) 08/07/2019   Allergy, unspecified, initial encounter 08/17/8526   Complication of vascular dialysis catheter 08/02/2019   Drug abuse counseling and surveillance of drug abuser 08/02/2019   Other specified coagulation defects ([place]) 08/02/2019   Secondary hyperparathyroidism of renal origin ([place]) 08/02/2019   Unspecified atrial fibrillation ([place]) 08/02/2019   ESRD on hemodialysis ([place])    Cocaine abuse ([place])    CHF exacerbation ([place]) 07/21/2019   Diabetic foot ulcer ([name]) 02/11/2019    AVM (arteriovenous malformation)    Melena 02/07/2019   Symptomatic anemia 02/06/2019   Hyperlipidemia associated with type 2 diabetes mellitus ([place]) 05/30/2018   Right foot ulcer ([place]) 02/28/2018   Chronic obstructive pulmonary disease ([place]) 02/28/2018   Anemia of chronic disease 11/20/2016   CHF (congestive heart failure) ([name]) 11/10/2016   History of lumbar laminectomy for spinal cord decompression 02/29/2016   Type 2 diabetes mellitus with diabetic polyneuropathy, with long-term current use of insulin ([place]) 06/17/2015   HTN (hypertension) 04/26/2015   DM (diabetes mellitus) ([place]) 04/26/2015   Ischemic cardiomyopathy 05/12/2014   Hyperkalemia 09/06/2013   Bunion of left foot 11/25/2011   Bunion, right foot 11/25/2011   Polysubstance abuse ([place]) 07/28/2011   Hip fracture, left ([place]) 04/04/2011   Closed fracture of neck of femur ([place]) 04/04/2011   Insomnia 02/18/2011   Coronary artery disease involving native heart without angina pectoris 04/20/2009   Chronic combined systolic (congestive) and diastolic (congestive) heart failure ([place]) 04/20/2009   Mixed hyperlipidemia 11/20/2006   Gout 11/20/2006   TOBACCO ABUSE 11/20/2006   Essential hypertension 11/20/2006    Patient's Medications  New Prescriptions   No medications on file  Previous Medications   ACETAMINOPHEN (TYLENOL) 650 MG CR TABLET    Take 1,300 mg by mouth 3 (three) times daily.   ALLOPURINOL (ZYLOPRIM) 100 MG TABLET    Take one tablet by mouth once daily.   ATORVASTATIN (LIPITOR) 10 MG TABLET    TAKE 1 TABLET(10 MG) BY MOUTH DAILY   B-D UF III MINI PEN NEEDLES 31G X 5 MM MISC  USE 4 TIMES DAILY   CALCIUM ACETATE 667 MG TABS    Take 667-2,001 mg by mouth See admin instructions. Take 2001 mg by mouth three times a day with meals and 667 mg with each snack   CARVEDILOL (COREG) 3.125 MG TABLET    Take 1 tablet (3.125 mg total) by mouth 2 (two) times daily with a meal. CALL AND SCHEDULE 1 YEAR FOLLOW UP OFFICE VISIT  TO RECEIVE FURTHER REFILLS. [phone].   CEFTAZIDIME 2 G IN SODIUM CHLORIDE 0.9 % 100 ML    Inject 2 g into the vein every Monday, Wednesday, and Friday at 8 PM.   DICLOFENAC SODIUM (VOLTAREN) 1 % GEL    APPLY 2 GRAMS TOPICALLY TO THE AFFECTED AREA THREE TIMES DAILY AS NEEDED FOR PAIN   DOCUSATE SODIUM (COLACE) 100 MG CAPSULE    Take 100 mg by mouth daily as needed for mild constipation.   FOLIC ACID (FOLVITE) 1 MG TABLET    TAKE 1 TABLET(1 MG) BY MOUTH DAILY   GABAPENTIN (NEURONTIN) 300 MG CAPSULE    TAKE 1 CAPSULE BY MOUTH DAILY.   GENTAMICIN OINTMENT (GARAMYCIN) 0.1 %    Apply 1 Application topically daily.   GLUCOSE BLOOD (ONETOUCH ULTRA) TEST STRIP    Check blood sugar three times daily E11.40   INSULIN LISPRO (HUMALOG) 100 UNIT/ML KWIKPEN    INJECT 20 UNITS UNDER THE SKIN THREE TIMES DAILY AS NEEDED FOR HIGH BLOOD SUGAR(ABOVE 150)   ISOSORBIDE MONONITRATE (IMDUR) 30 MG 24 HR TABLET    Take 1 tablet (30 mg total) by mouth daily.   LANCETS (ONETOUCH DELICA PLUS [id number]) MISC    1 Device by Other route 3 (three) times daily. E11.42   LATANOPROST (XALATAN) 0.005 % OPHTHALMIC SOLUTION    Place 1 drop into both eyes at bedtime.   LIDOCAINE-PRILOCAINE (EMLA) CREAM    Apply 1 application topically every Monday, Wednesday, and Friday with hemodialysis.   MENTHOL-CAMPHOR (TIGER BALM ARTHRITIS RUB) 11-11 % CREA    Apply 1 Application topically daily as needed (pain).   MULTIVITAMIN (RENA-VIT) TABS TABLET    Take 1 tablet by mouth daily.   NITROGLYCERIN (NITROSTAT) 0.3 MG SL TABLET    PLACE 1 TABLET UNDER THE TONGUE AS NEEDED FOR CHEST PAIN   OCTREOTIDE (SANDOSTATIN LAR DEPOT) 10 MG INJECTION    Inject 10 mg into the muscle every 28 days.   OMEPRAZOLE (PRILOSEC) 40 MG CAPSULE    Take 1 capsule (40 mg total) by mouth 2 (two) times daily for 28 days.   ONDANSETRON (ZOFRAN) 4 MG TABLET    TAKE 1 TABLET(4 MG) BY MOUTH EVERY 8 HOURS AS NEEDED FOR NAUSEA OR VOMITING   OXYCODONE-ACETAMINOPHEN (PERCOCET)  5-325 MG TABLET    Take 1 tablet by mouth every 6 (six) hours as needed for severe pain.   POLYETHYLENE GLYCOL (MIRALAX / GLYCOLAX) 17 G PACKET    Take 17 g by mouth daily as needed for mild constipation.   TIMOLOL (TIMOPTIC) 0.5 % OPHTHALMIC SOLUTION    Place 1 drop into both eyes in the morning.   UMECLIDINIUM-VILANTEROL (ANORO ELLIPTA) 62.5-25 MCG/ACT AEPB    INHALE 1 PUFF BY MOUTH EVERY DAY   VANCOMYCIN (VANCOCIN) 1-5 GM/200ML-% SOLN    Inject 200 mLs (1,000 mg total) into the vein every Monday, Wednesday, and Friday with hemodialysis.  Modified Medications   Modified Medication Previous Medication   BIKTARVY 50-200-25 MG TABS TABLET BIKTARVY 50-200-25 MG TABS tablet      Take  1 tablet by mouth daily.    Take 1 tablet by mouth daily.  Discontinued Medications   No medications on file    Subjective: Rat is in for his routine hospital follow-up visit.  He was hospitalized on 07/18/2022 with right fourth metatarsal osteomyelitis.  He underwent ray amputation 2 days later.  Operative cultures grew methicillin sensitive staph epi and Staph aureus.  He was discharged to receive IV vancomycin and ceftazidime after hemodialysis through 09/10/2022.  He has not had any problems tolerating his antibiotics and he feels like his wound is healing slowly.  When he was seen at the wound center on 07/26/2022 there was no drainage or signs of active infection.  He tells me that his stitches will be removed soon.  He has not had any problems obtaining, taking or tolerating his Biktarvy and does not recall missing any doses.  He has a history of Helicobacter pylori gastritis and has failed 3 rounds of antibiotics/PPI therapy.  He refused to try a fourth round of treatment.  He denies any current problems with acid indigestion and he has had no evidence of recurrent GI bleeding.  Review of Systems: Review of Systems  Constitutional:  Negative for chills, diaphoresis and fever.  Gastrointestinal:  Negative for  abdominal pain, diarrhea, nausea and vomiting.  Musculoskeletal:  Negative for joint pain.  Psychiatric/Behavioral:  Positive for depression. The patient is not nervous/anxious.     Past Medical History:  Diagnosis Date   Acute respiratory failure ([place]) 03/01/2018   Anemia    Arthritis    "all over; mostly knees and back" (02/28/2018)   Chronic combined systolic and diastolic CHF (congestive heart failure) (HCC)    Chronic lower back pain    stenosis   Community acquired pneumonia 09/06/2013   COPD (chronic obstructive pulmonary disease) ([place])    Coronary atherosclerosis of native coronary artery    a. 02/2003 s/p CABG x 2 (VG->RI, VG->RPDA; b. 11/2019 PCI: LM nl, LAD 90d, D3 50, RI 100, LCX 100p, OM3 100 - fills via L->L collats from D2/dLAD, RCA 100p, VG->RPDA ok, VG->RI 95 (3.5x48 Synergy XD DES).   Drug abuse ([place])    hx; tested for cocaine as recently as 2/08. says he is not using drugs now - avoided defib. for this reason    ESRD (end stage renal disease) ([place])    Hemo M-W-F- [facility]   Fall at home 10/2020   GERD (gastroesophageal reflux disease)    takes OTC meds as needed   GI bleeding    a. 11/2019 EGD: angiodysplastic lesions w/ bleeding s/p argon plasma/clipping/epi inj. Multiple admissions for the same.   Glaucoma    uses eye drops daily   Hepatitis B 1968   "tx'd w/isolation; caught it from toilet stools in gym"   History of blood transfusion 03/01/2019   History of colon polyps    benign   History of gout    takes Allopurinol daily as well as Colchicine-if needed (02/28/2018)   History of kidney stones    HTN (hypertension)    takes Coreg,Imdur.and Apresoline daily   Human immunodeficiency virus (HIV) disease ([place]) dx'd 1995   on [place] as of 12/2020.     Hyperlipidemia    Ischemic cardiomyopathy    a. 01/2019 Echo: EF 40-45%, diffuse HK, mild basal septal hypertrophy. Diast dysfxn. Nl RV size/fxn. Sev dil LA. Triv MR/TR/PR.   Muscle spasm    takes Zanaflex as  needed   Myocardial infarction ([place]) ~  2004/2005   Nocturia    Peripheral neuropathy    takes gabapentin daily   Pneumonia    "at least twice" (02/28/2018)   SDH (subdural hematoma) (HCC)    Syphilis, unspecified    Type II diabetes mellitus ([place]) 2004   Lantus daily.Average fasting blood sugar 125-199   Wears glasses    Wears partial dentures     Social History   Tobacco Use   Smoking status: Every Day    Packs/day: 0.50    Years: 43.00    Total pack years: 21.50    Types: Cigarettes    Passive exposure: Never   Smokeless tobacco: Never  Vaping Use   Vaping Use: Never used  Substance Use Topics   Alcohol use: Yes    Alcohol/week: 12.0 standard drinks of alcohol    Types: 12 Standard drinks or equivalent per week    Comment: occassional   Drug use: Not Currently    Types: Cocaine    Comment: hx of crack/cocaine 37yr ago 10/01/2019- none    Family History  Problem Relation Age of Onset   Heart failure Father    Hypertension Father    Diabetes Brother    Heart attack Brother    Alzheimer's disease Mother    Stroke Sister    Diabetes Sister    Alzheimer's disease Sister    Hypertension Brother    Diabetes Brother    Drug abuse Brother    Colon cancer Neg Hx     Allergies  Allergen Reactions   Augmentin [Amoxicillin-Pot Clavulanate] Diarrhea and Other (See Comments)    Severe diarrhea   Morphine Sulfate Anaphylaxis    Can't mix with oxycodone    Mucinex Fast-Max Other (See Comments)    Intense sweating    Amphetamines Other (See Comments)    Unknown reaction    Health Maintenance  Topic Date Due   COVID-19 Vaccine (5 - Pfizer risk series) 09/09/2021   HEMOGLOBIN A1C  08/09/2022   COLONOSCOPY (Pts 45-483yrInsurance coverage will need to be confirmed)  10/01/2022   OPHTHALMOLOGY EXAM  10/05/2022   FOOT EXAM  11/16/2022   LIPID PANEL  02/25/2023   TETANUS/TDAP  06/13/2029   Pneumonia Vaccine [age]Years old  Completed   INFLUENZA VACCINE  Completed    Hepatitis C Screening  Completed   Zoster Vaccines- Shingrix  Completed   HPV VACCINES  Aged Out    Objective:  Vitals:   08/04/22 0847  BP: (!) 105/58  Pulse: 74  Resp: 16  Temp: 97.6 F (36.4 C)  SpO2: 98%   There is no height or weight on file to calculate BMI.  Physical Exam Constitutional:      Comments: He is in good spirits.  Cardiovascular:     Rate and Rhythm: Normal rate and regular rhythm.     Heart sounds: Murmur heard.     Comments: 1/6 early systolic murmur. Pulmonary:     Effort: Pulmonary effort is normal.     Breath sounds: Normal breath sounds.  Musculoskeletal:     Comments: He has a clean dry dressing over his right foot wound.  Neurological:     Comments: He walks with the aid of a cane.  Psychiatric:        Mood and Affect: Mood normal.     Lab Results Lab Results  Component Value Date   WBC 4.2 07/21/2022   HGB 8.3 (L) 07/21/2022   HCT 24.5 (L) 07/21/2022   MCV 102.9 (  H) 07/21/2022   PLT 135 (L) 07/21/2022    Lab Results  Component Value Date   CREATININE 7.10 (H) 07/20/2022   BUN 39 (H) 07/20/2022   NA 134 (L) 07/20/2022   K 4.4 07/20/2022   CL 95 (L) 07/20/2022   CO2 23 07/20/2022    Lab Results  Component Value Date   ALT 22 07/19/2022   AST 33 07/19/2022   ALKPHOS 100 07/19/2022   BILITOT 1.2 07/19/2022    Lab Results  Component Value Date   CHOL 154 02/24/2022   HDL 34 (L) 02/24/2022   LDLCALC 80 02/24/2022   TRIG 332 (H) 02/24/2022   CHOLHDL 4.5 02/24/2022   Lab Results  Component Value Date   LABRPR NON-REACTIVE 02/24/2022   RPRTITER 1:1 (H) 06/30/2020   HIV 1 RNA Quant  Date Value  02/24/2022 NOT DETECTED copies/mL  04/06/2021 <20 Copies/mL (H)  06/30/2020 <20 Copies/mL   CD4 T Cell Abs (/uL)  Date Value  02/24/2022 315 (L)  04/06/2021 406  06/30/2020 403     Problem List Items Addressed This Visit       High   Human immunodeficiency virus (HIV) disease ([place]) (Chronic)    His infection has been  under excellent, long-term control.  He will continue Biktarvy.  He received his influenza vaccine here today.      Relevant Medications   BIKTARVY 50-200-25 MG TABS tablet   Helicobacter pylori (H. pylori) infection    Although he has failed treatment for Helicobacter on 3 occasions he is not having any signs of recurrent Helicobacter pylori gastritis.  He does not want to pursue further treatment at this time.      Relevant Medications   BIKTARVY 50-200-25 MG TABS tablet     Unprioritized   Diabetic infection of right foot ([place])    He is tolerating therapy for diabetic foot infection and fourth metatarsal osteomyelitis.  He will follow-up here on 08/31/2022 to determine if it is appropriate to stop antibiotics at that time.      Relevant Medications   BIKTARVY 50-200-25 MG TABS tablet   Other Visit Diagnoses     Need for immunization against influenza    -  Primary   Relevant Orders   Flu Vaccine QUAD High Dose(Fluad) (Completed)         [name], MD [facility] for [place] [phone] pager   [phone] cell 08/04/2022, 9:20 AM

## 2022-08-05 DIAGNOSIS — D631 Anemia in chronic kidney disease: Secondary | ICD-10-CM | POA: Diagnosis not present

## 2022-08-05 DIAGNOSIS — D649 Anemia, unspecified: Secondary | ICD-10-CM | POA: Diagnosis not present

## 2022-08-05 DIAGNOSIS — E1169 Type 2 diabetes mellitus with other specified complication: Secondary | ICD-10-CM | POA: Diagnosis not present

## 2022-08-05 DIAGNOSIS — E1122 Type 2 diabetes mellitus with diabetic chronic kidney disease: Secondary | ICD-10-CM | POA: Diagnosis not present

## 2022-08-05 DIAGNOSIS — M86671 Other chronic osteomyelitis, right ankle and foot: Secondary | ICD-10-CM | POA: Diagnosis not present

## 2022-08-05 DIAGNOSIS — M868X8 Other osteomyelitis, other site: Secondary | ICD-10-CM | POA: Diagnosis not present

## 2022-08-05 DIAGNOSIS — E782 Mixed hyperlipidemia: Secondary | ICD-10-CM | POA: Diagnosis not present

## 2022-08-05 DIAGNOSIS — B9561 Methicillin susceptible Staphylococcus aureus infection as the cause of diseases classified elsewhere: Secondary | ICD-10-CM | POA: Diagnosis not present

## 2022-08-05 DIAGNOSIS — N186 End stage renal disease: Secondary | ICD-10-CM | POA: Diagnosis not present

## 2022-08-05 DIAGNOSIS — Z992 Dependence on renal dialysis: Secondary | ICD-10-CM | POA: Diagnosis not present

## 2022-08-05 DIAGNOSIS — L97516 Non-pressure chronic ulcer of other part of right foot with bone involvement without evidence of necrosis: Secondary | ICD-10-CM | POA: Diagnosis not present

## 2022-08-05 DIAGNOSIS — E11621 Type 2 diabetes mellitus with foot ulcer: Secondary | ICD-10-CM | POA: Diagnosis not present

## 2022-08-05 DIAGNOSIS — N2581 Secondary hyperparathyroidism of renal origin: Secondary | ICD-10-CM | POA: Diagnosis not present

## 2022-08-08 DIAGNOSIS — M868X8 Other osteomyelitis, other site: Secondary | ICD-10-CM | POA: Diagnosis not present

## 2022-08-08 DIAGNOSIS — N2581 Secondary hyperparathyroidism of renal origin: Secondary | ICD-10-CM | POA: Diagnosis not present

## 2022-08-08 DIAGNOSIS — D631 Anemia in chronic kidney disease: Secondary | ICD-10-CM | POA: Diagnosis not present

## 2022-08-08 DIAGNOSIS — Z992 Dependence on renal dialysis: Secondary | ICD-10-CM | POA: Diagnosis not present

## 2022-08-08 DIAGNOSIS — E1122 Type 2 diabetes mellitus with diabetic chronic kidney disease: Secondary | ICD-10-CM | POA: Diagnosis not present

## 2022-08-08 DIAGNOSIS — E11621 Type 2 diabetes mellitus with foot ulcer: Secondary | ICD-10-CM | POA: Diagnosis not present

## 2022-08-08 DIAGNOSIS — E1169 Type 2 diabetes mellitus with other specified complication: Secondary | ICD-10-CM | POA: Diagnosis not present

## 2022-08-08 DIAGNOSIS — B9561 Methicillin susceptible Staphylococcus aureus infection as the cause of diseases classified elsewhere: Secondary | ICD-10-CM | POA: Diagnosis not present

## 2022-08-08 DIAGNOSIS — D649 Anemia, unspecified: Secondary | ICD-10-CM | POA: Diagnosis not present

## 2022-08-08 DIAGNOSIS — E782 Mixed hyperlipidemia: Secondary | ICD-10-CM | POA: Diagnosis not present

## 2022-08-08 DIAGNOSIS — M86671 Other chronic osteomyelitis, right ankle and foot: Secondary | ICD-10-CM | POA: Diagnosis not present

## 2022-08-08 DIAGNOSIS — N186 End stage renal disease: Secondary | ICD-10-CM | POA: Diagnosis not present

## 2022-08-08 DIAGNOSIS — L97516 Non-pressure chronic ulcer of other part of right foot with bone involvement without evidence of necrosis: Secondary | ICD-10-CM | POA: Diagnosis not present

## 2022-08-08 NOTE — Progress Notes (Unsigned)
Office Visit    Patient Name: Jacob [name] Date of Encounter: 08/09/2022  PCP:  Jacob [name], [facility]  Cardiologist:  Jacob Rouge, MD  Advanced Practice Provider:  No care team member to display Electrophysiologist:  None   HPI    Jacob [name] is a 71 y.o. male with history of coronary artery disease status post CABG x2 in 2004, DES to VG-RI in 11/2019, ischemic cardiomyopathy/chronic combined CHF, end-stage renal disease on hemodialysis, HIV, hypertension, hyperlipidemia, type 2 diabetes mellitus, depression, GI bleeding requiring transfusions-recurrent due to AVMs, anemia previously treated with IV iron, SDH, tobacco abuse, prior cocaine use, RBBB, peripheral neuropathy, gout, GERD presents today for annual follow-up appointment.  Patient's last echocardiogram 09/2020 showed EF 40 to 45%, grade 2 DD, moderately dilated LV, moderate LVH, normal PASP, mild MR, moderately dilated LAE.  Started hemodialysis October 2020.  Since that time he had 6 additional medical admissions for multiple issues including small SDH and left femur fracture following a fall, incidental COVID-19, and recurrent UGIB/anemia requiring transfusions, several EGDs, and AVM status post clipping several times.  After admission in January for SDH, his Jacob [name] was resumed.  Cardiology was not consulted at that time.  Brilinta was no longer on his list March 2022.  He had been on ASA periodically interrupted for his GI bleeding.  He was last seen 08/2021 and was most bothered by chronic right lower back pain/hip pain which was worse at rest and with ambulation.  He was seen by orthopedics for this and was taking tramadol.  Lumbar fusion but seem to to be tabled at the time.  No cardiac complaints.  Hemoglobin was better at 11.4.  Today, he tells me that he feels really hot.  He is visibly sweaty.  He states he has not eaten much today.  Extremities are cool and clammy.  He does  not have any chest pain or shortness of breath.  We checked his sugar and it was 38.  We provided him with glucose tabs, ginger ale, and crackers.  We rechecked it and it had increased by about 10 points.  He had MRSA in his right foot and is afraid it might be reinfected.  We discussed obtaining an echocardiogram since he has a murmur on exam.  He is a dialysis patient and goes to dialysis Mondays, Wednesday, and Friday.  Reports no shortness of breath nor dyspnea on exertion. Reports no chest pain, pressure, or tightness. No edema, orthopnea, PND. Reports no palpitations.    Past Medical History    Past Medical History:  Diagnosis Date   Acute respiratory failure ([place]) 03/01/2018   Anemia    Arthritis    "all over; mostly knees and back" (02/28/2018)   Chronic combined systolic and diastolic CHF (congestive heart failure) (HCC)    Chronic lower back pain    stenosis   Community acquired pneumonia 09/06/2013   COPD (chronic obstructive pulmonary disease) ([place])    Coronary atherosclerosis of native coronary artery    a. 02/2003 s/p CABG x 2 (VG->RI, VG->RPDA; b. 11/2019 PCI: LM nl, LAD 90d, D3 50, RI 100, LCX 100p, OM3 100 - fills via L->L collats from D2/dLAD, RCA 100p, VG->RPDA ok, VG->RI 95 (3.5x48 Synergy XD DES).   Drug abuse ([place])    hx; tested for cocaine as recently as 2/08. says he is not using drugs now - avoided defib. for this reason    ESRD (end stage  renal disease) ([place])    Hemo M-W-F- [facility]   Fall at home 10/2020   GERD (gastroesophageal reflux disease)    takes OTC meds as needed   GI bleeding    a. 11/2019 EGD: angiodysplastic lesions w/ bleeding s/p argon plasma/clipping/epi inj. Multiple admissions for the same.   Glaucoma    uses eye drops daily   Hepatitis B 1968   "tx'd w/isolation; caught it from toilet stools in gym"   History of blood transfusion 03/01/2019   History of colon polyps    benign   History of gout    takes Allopurinol daily as well as  Colchicine-if needed (02/28/2018)   History of kidney stones    HTN (hypertension)    takes Coreg,Imdur.and Apresoline daily   Human immunodeficiency virus (HIV) disease ([place]) dx'd 1995   on [place] as of 12/2020.     Hyperlipidemia    Ischemic cardiomyopathy    a. 01/2019 Echo: EF 40-45%, diffuse HK, mild basal septal hypertrophy. Diast dysfxn. Nl RV size/fxn. Sev dil LA. Triv MR/TR/PR.   Muscle spasm    takes Zanaflex as needed   Myocardial infarction ([place]) ~ 2004/2005   Nocturia    Peripheral neuropathy    takes gabapentin daily   Pneumonia    "at least twice" (02/28/2018)   SDH (subdural hematoma) (HCC)    Syphilis, unspecified    Type II diabetes mellitus ([place]) 2004   Lantus daily.Average fasting blood sugar 125-199   Wears glasses    Wears partial dentures    Past Surgical History:  Procedure Laterality Date   AV FISTULA PLACEMENT Left 08/02/2018   Procedure: ARTERIOVENOUS (AV) FISTULA CREATION  left arm radiocephlic;  Surgeon: Marty Heck, MD;  Location: [place];  Service: Vascular;  Laterality: Left;   AV FISTULA PLACEMENT Left 08/01/2019   Procedure: LEFT BRACHIOCEPHALIC ARTERIOVENOUS (AV) FISTULA CREATION;  Surgeon: [name], MD;  Location: [place];  Service: Vascular;  Laterality: Left;   AV FISTULA PLACEMENT Right 04/12/2022   Procedure: RIGHT UPPER EXTREMITY ARTERIOVENOUS (AV)  GRAFT INSERTION USING GORE STRETCH 4-7 MM;  Surgeon: Angelia Mould, MD;  Location: [place];  Service: Vascular;  Laterality: Right;   [place] Left 10/03/2019   Procedure: BASILIC VEIN TRANSPOSITION LEFT SECOND STAGE;  Surgeon: [name], MD;  Location: [place];  Service: Vascular;  Laterality: Left;   BIOPSY  01/25/2021   Procedure: BIOPSY;  Surgeon: Doran Stabler, MD;  Location: [facility] ENDOSCOPY;  Service: Gastroenterology;;   CARDIAC CATHETERIZATION  10/2002; 12/19/2004   [name] 03/08/2011   COLONOSCOPY  2013   [place]    CORONARY ARTERY BYPASS GRAFT   02/24/2003   CABG X2/notes 03/08/2011   CORONARY STENT INTERVENTION N/A 12/19/2019   Procedure: CORONARY STENT INTERVENTION;  Surgeon: Jettie Booze, MD;  Location: [place] CV LAB;  Service: Cardiovascular;  Laterality: N/A;   ENTEROSCOPY N/A 01/25/2021   Procedure: ENTEROSCOPY;  Surgeon: Doran Stabler, MD;  Location: [place];  Service: Gastroenterology;  Laterality: N/A;   ENTEROSCOPY N/A 02/13/2021   Procedure: ENTEROSCOPY;  Surgeon: Jackquline Denmark, MD;  Location: [facility] ENDOSCOPY;  Service: Endoscopy;  Laterality: N/A;   ENTEROSCOPY N/A 05/07/2021   Procedure: ENTEROSCOPY;  Surgeon: Yetta Flock, MD;  Location: [facility] ENDOSCOPY;  Service: Gastroenterology;  Laterality: N/A;   ESOPHAGOGASTRODUODENOSCOPY (EGD) WITH PROPOFOL N/A 02/08/2019   Procedure: ESOPHAGOGASTRODUODENOSCOPY (EGD) WITH PROPOFOL;  Surgeon: Milus Banister, MD;  Location: [place];  Service: Gastroenterology;  Laterality: N/A;   ESOPHAGOGASTRODUODENOSCOPY (EGD) WITH PROPOFOL N/A 12/22/2019   Procedure: ESOPHAGOGASTRODUODENOSCOPY (EGD) WITH PROPOFOL;  Surgeon: Lavena Bullion, DO;  Location: [place];  Service: Gastroenterology;  Laterality: N/A;   ESOPHAGOGASTRODUODENOSCOPY (EGD) WITH PROPOFOL N/A 10/19/2020   Procedure: ESOPHAGOGASTRODUODENOSCOPY (EGD) WITH PROPOFOL;  Surgeon: Jackquline Denmark, MD;  Location: [facility] ENDOSCOPY;  Service: Endoscopy;  Laterality: N/A;   ESOPHAGOGASTRODUODENOSCOPY (EGD) WITH PROPOFOL N/A 12/22/2020   Procedure: ESOPHAGOGASTRODUODENOSCOPY (EGD) WITH PROPOFOL;  Surgeon: Gatha Mayer, MD;  Location: [name];  Service: Endoscopy;  Laterality: N/A;   ESOPHAGOGASTRODUODENOSCOPY (EGD) WITH PROPOFOL N/A 01/09/2021   Procedure: ESOPHAGOGASTRODUODENOSCOPY (EGD) WITH PROPOFOL;  Surgeon: Irene Shipper, MD;  Location: [facility] ENDOSCOPY;  Service: Endoscopy;  Laterality: N/A;   HEMOSTASIS CLIP PLACEMENT  12/22/2019   Procedure: HEMOSTASIS CLIP PLACEMENT;  Surgeon: Lavena Bullion, DO;   Location: [place] ENDOSCOPY;  Service: Gastroenterology;;   HEMOSTASIS CLIP PLACEMENT  12/22/2020   Procedure: HEMOSTASIS CLIP PLACEMENT;  Surgeon: Gatha Mayer, MD;  Location: [facility] ENDOSCOPY;  Service: Endoscopy;;   HEMOSTASIS CONTROL  12/22/2020   Procedure: HEMOSTASIS CONTROL/hemospray;  Surgeon: Gatha Mayer, MD;  Location: [place];  Service: Endoscopy;;   HOT HEMOSTASIS N/A 02/08/2019   Procedure: HOT HEMOSTASIS (ARGON PLASMA COAGULATION/BICAP);  Surgeon: Milus Banister, MD;  Location: [facility] ENDOSCOPY;  Service: Gastroenterology;  Laterality: N/A;   HOT HEMOSTASIS N/A 12/22/2019   Procedure: HOT HEMOSTASIS (ARGON PLASMA COAGULATION/BICAP);  Surgeon: Lavena Bullion, DO;  Location: [facility] ENDOSCOPY;  Service: Gastroenterology;  Laterality: N/A;   HOT HEMOSTASIS N/A 10/19/2020   Procedure: HOT HEMOSTASIS (ARGON PLASMA COAGULATION/BICAP);  Surgeon: Jackquline Denmark, MD;  Location: [facility] ENDOSCOPY;  Service: Endoscopy;  Laterality: N/A;   HOT HEMOSTASIS N/A 12/22/2020   Procedure: HOT HEMOSTASIS (ARGON PLASMA COAGULATION/BICAP);  Surgeon: Gatha Mayer, MD;  Location: [facility] ENDOSCOPY;  Service: Endoscopy;  Laterality: N/A;   HOT HEMOSTASIS N/A 01/09/2021   Procedure: HOT HEMOSTASIS (ARGON PLASMA COAGULATION/BICAP);  Surgeon: Irene Shipper, MD;  Location: [facility] ENDOSCOPY;  Service: Endoscopy;  Laterality: N/A;   HOT HEMOSTASIS N/A 01/25/2021   Procedure: HOT HEMOSTASIS (ARGON PLASMA COAGULATION/BICAP);  Surgeon: Doran Stabler, MD;  Location: [place];  Service: Gastroenterology;  Laterality: N/A;   HOT HEMOSTASIS N/A 02/13/2021   Procedure: HOT HEMOSTASIS (ARGON PLASMA COAGULATION/BICAP);  Surgeon: Jackquline Denmark, MD;  Location: [facility] ENDOSCOPY;  Service: Endoscopy;  Laterality: N/A;   HOT HEMOSTASIS N/A 05/07/2021   Procedure: HOT HEMOSTASIS (ARGON PLASMA COAGULATION/BICAP);  Surgeon: Yetta Flock, MD;  Location: [facility] ENDOSCOPY;  Service: Gastroenterology;  Laterality: N/A;   INSERTION OF DIALYSIS  CATHETER N/A 02/08/2022   Procedure: INSERTION OF TUNNELED DIALYSIS CATHETER;  Surgeon: Angelia Mould, MD;  Location: [place];  Service: Vascular;  Laterality: N/A;   INTERTROCHANTERIC HIP FRACTURE SURGERY Left 11/2006   [name] 03/08/2011   INTRAVASCULAR ULTRASOUND/IVUS N/A 12/19/2019   Procedure: Intravascular Ultrasound/IVUS;  Surgeon: Jettie Booze, MD;  Location: [place] CV LAB;  Service: Cardiovascular;  Laterality: N/A;   IR FLUORO GUIDE CV LINE LEFT  03/14/2022   IR FLUORO GUIDE CV LINE RIGHT  07/24/2019   IR FLUORO GUIDE CV LINE RIGHT  07/30/2019   IR US GUIDE VASC ACCESS RIGHT  07/24/2019   IR US GUIDE VASC ACCESS RIGHT  07/30/2019   LAPAROSCOPIC CHOLECYSTECTOMY  05/2006   LIGATION OF ARTERIOVENOUS  FISTULA Left 02/08/2022   Procedure: LIGATION OF LEFT ARTERIOVENOUS  FISTULA;  Surgeon: Angelia Mould, MD;  [facility];  Service: Vascular;  Laterality: Left;   LIGATION OF COMPETING BRANCHES OF ARTERIOVENOUS FISTULA Left 11/05/2018   Procedure: LIGATION OF COMPETING BRANCHES OF ARTERIOVENOUS FISTULA  LEFT  ARM;  Surgeon: Marty Heck, MD;  Location: [place];  Service: Vascular;  Laterality: Left;   LUMBAR LAMINECTOMY/DECOMPRESSION MICRODISCECTOMY N/A 02/29/2016   Procedure: Left L4-5 Lateral Recess Decompression, Removal Extradural Intraspinal Facet Cyst;  Surgeon: Marybelle Killings, MD;  Location: [place];  Service: Orthopedics;  Laterality: N/A;   METATARSAL HEAD EXCISION Right 07/20/2022   Procedure: Irragation and debridement of right foot wound with extraction of all necrotic soft tissue and bone;  Surgeon: Yevonne Pax, DPM;  Location: [place];  Service: Podiatry;  Laterality: Right;  Right 4th Met head and base of proximal phalanx resection   MULTIPLE TOOTH EXTRACTIONS     ORIF MANDIBULAR FRACTURE Left 08/13/2004   ORIF of left body fracture mandible with KLS Martin 2.3-mm six hole/notes 03/08/2011   RIGHT/LEFT HEART CATH AND CORONARY/GRAFT ANGIOGRAPHY N/A  12/19/2019   Procedure: RIGHT/LEFT HEART CATH AND CORONARY/GRAFT ANGIOGRAPHY;  Surgeon: Jettie Booze, MD;  Location: [place] CV LAB;  Service: Cardiovascular;  Laterality: N/A;   SCLEROTHERAPY  12/22/2019   Procedure: SCLEROTHERAPY;  Surgeon: Lavena Bullion, DO;  Location: [place] ENDOSCOPY;  Service: Gastroenterology;;   Clide Deutscher  02/13/2021   Procedure: Clide Deutscher;  Surgeon: Jackquline Denmark, MD;  Location: [facility] ENDOSCOPY;  Service: Endoscopy;;    Allergies  Allergies  Allergen Reactions   Augmentin [Amoxicillin-Pot Clavulanate] Diarrhea and Other (See Comments)    Severe diarrhea   Morphine Sulfate Anaphylaxis    Can't mix with oxycodone    Mucinex Fast-Max Other (See Comments)    Intense sweating    Amphetamines Other (See Comments)    Unknown reaction    EKGs/Labs/Other Studies Reviewed:   The following studies were reviewed today:  Echocardiogram 10/18/2020  IMPRESSIONS     1. Left ventricular ejection fraction, by estimation, is 40 to 45%. The  left ventricle has mildly decreased function. The left ventricle  demonstrates regional wall motion abnormalities (see scoring  diagram/findings for description). The left ventricular   internal cavity size was moderately dilated. There is moderate concentric  left ventricular hypertrophy. Left ventricular diastolic parameters are  consistent with Grade II diastolic dysfunction (pseudonormalization).  Elevated left atrial pressure.   2. Right ventricular systolic function is normal. The right ventricular  size is normal. There is normal pulmonary artery systolic pressure.   3. Left atrial size was moderately dilated.   4. The mitral valve is normal in structure. Mild mitral valve  regurgitation. No evidence of mitral stenosis.   5. The aortic valve is normal in structure. Aortic valve regurgitation is  not visualized. No aortic stenosis is present.   6. The inferior vena cava is dilated in size with >50%  respiratory  variability, suggesting right atrial pressure of 8 mmHg.   FINDINGS   Left Ventricle: Left ventricular ejection fraction, by estimation, is 40  to 45%. The left ventricle has mildly decreased function. The left  ventricle demonstrates regional wall motion abnormalities. The left  ventricular internal cavity size was  moderately dilated. There is moderate concentric left ventricular  hypertrophy. Left ventricular diastolic parameters are consistent with  Grade II diastolic dysfunction (pseudonormalization). Elevated left atrial  pressure.      LV Wall Scoring:  The mid and distal lateral wall, entire inferior wall, and posterior wall  are  hypokinetic.   Right  Ventricle: The right ventricular size is normal. No increase in  right ventricular wall thickness. Right ventricular systolic function is  normal. There is normal pulmonary artery systolic pressure.   Left Atrium: Left atrial size was moderately dilated.   Right Atrium: Right atrial size was normal in size.   Pericardium: There is no evidence of pericardial effusion.   Mitral Valve: The mitral valve is normal in structure. Mild mitral valve  regurgitation. No evidence of mitral valve stenosis.   Tricuspid Valve: The tricuspid valve is normal in structure. Tricuspid  valve regurgitation is not demonstrated. No evidence of tricuspid  stenosis.   Aortic Valve: The aortic valve is normal in structure. Aortic valve  regurgitation is not visualized. No aortic stenosis is present. Aortic  valve mean gradient measures 2.0 mmHg. Aortic valve peak gradient measures  4.1 mmHg. Aortic valve area, by VTI  measures 3.79 cm.   Pulmonic Valve: The pulmonic valve was normal in structure. Pulmonic valve  regurgitation is not visualized. No evidence of pulmonic stenosis.   Aorta: The aortic root is normal in size and structure.   Venous: The inferior vena cava is dilated in size with greater than 50%  respiratory  variability, suggesting right atrial pressure of 8 mmHg.   IAS/Shunts: No atrial level shunt detected by color flow Doppler.   EKG:  EKG is not ordered today.    Recent Labs: 07/19/2022: ALT 22 07/20/2022: BUN 39; Creatinine, Ser 7.10; Magnesium 2.0; Potassium 4.4; Sodium 134 07/21/2022: Hemoglobin 8.3; Platelets 135  Recent Lipid Panel    Component Value Date/Time   CHOL 154 02/24/2022 0927   CHOL 111 05/13/2021 0844   TRIG 332 (H) 02/24/2022 0927   HDL 34 (L) 02/24/2022 0927   HDL 32 (L) 05/13/2021 0844   CHOLHDL 4.5 02/24/2022 0927   VLDL 19 12/21/2019 0352   LDLCALC 80 02/24/2022 0927    Home Medications   Current Meds  Medication Sig   acetaminophen (TYLENOL) 650 MG CR tablet Take 1,300 mg by mouth 3 (three) times daily.   allopurinol (ZYLOPRIM) 100 MG tablet Take one tablet by mouth once daily.   atorvastatin (LIPITOR) 10 MG tablet TAKE 1 TABLET(10 MG) BY MOUTH DAILY   B-D UF III MINI PEN NEEDLES 31G X 5 MM MISC USE 4 TIMES DAILY   BIKTARVY 50-200-25 MG TABS tablet Take 1 tablet by mouth daily.   Calcium Acetate 667 MG TABS Take 667-2,001 mg by mouth See admin instructions. Take 2001 mg by mouth three times a day with meals and 667 mg with each snack   carvedilol (COREG) 3.125 MG tablet Take 1 tablet (3.125 mg total) by mouth 2 (two) times daily with a meal. CALL AND SCHEDULE 1 YEAR FOLLOW UP OFFICE VISIT TO RECEIVE FURTHER REFILLS. [phone].   cefTAZidime 2 g in sodium chloride 0.9 % 100 mL Inject 2 g into the vein every Monday, Wednesday, and Friday at 8 PM.   diclofenac Sodium (VOLTAREN) 1 % GEL APPLY 2 GRAMS TOPICALLY TO THE AFFECTED AREA THREE TIMES DAILY AS NEEDED FOR PAIN   docusate sodium (COLACE) 100 MG capsule Take 100 mg by mouth daily as needed for mild constipation.   folic acid (FOLVITE) 1 MG tablet TAKE 1 TABLET(1 MG) BY MOUTH DAILY   gabapentin (NEURONTIN) 300 MG capsule TAKE 1 CAPSULE BY MOUTH DAILY.   gentamicin ointment (GARAMYCIN) 0.1 % Apply 1  Application topically daily.   glucose blood (ONETOUCH ULTRA) test strip Check blood sugar three times daily  E11.40   insulin lispro (HUMALOG) 100 UNIT/ML KwikPen INJECT 20 UNITS UNDER THE SKIN THREE TIMES DAILY AS NEEDED FOR HIGH BLOOD SUGAR(ABOVE 150)   isosorbide mononitrate (IMDUR) 30 MG 24 hr tablet Take 1 tablet (30 mg total) by mouth daily.   Lancets (ONETOUCH DELICA PLUS [id number]) MISC 1 Device by Other route 3 (three) times daily. E11.42   latanoprost (XALATAN) 0.005 % ophthalmic solution Place 1 drop into both eyes at bedtime.   lidocaine-prilocaine (EMLA) cream Apply 1 application topically every Monday, Wednesday, and Friday with hemodialysis.   Menthol-Camphor (TIGER BALM ARTHRITIS RUB) 11-11 % CREA Apply 1 Application topically daily as needed (pain).   multivitamin (RENA-VIT) TABS tablet Take 1 tablet by mouth daily.   nitroGLYCERIN (NITROSTAT) 0.3 MG SL tablet PLACE 1 TABLET UNDER THE TONGUE AS NEEDED FOR CHEST PAIN   octreotide (SANDOSTATIN LAR DEPOT) 10 MG injection Inject 10 mg into the muscle every 28 days.   omeprazole (PRILOSEC) 40 MG capsule Take 1 capsule (40 mg total) by mouth 2 (two) times daily for 28 days.   ondansetron (ZOFRAN) 4 MG tablet TAKE 1 TABLET(4 MG) BY MOUTH EVERY 8 HOURS AS NEEDED FOR NAUSEA OR VOMITING   oxyCODONE-acetaminophen (PERCOCET) 5-325 MG tablet Take 1 tablet by mouth every 6 (six) hours as needed for severe pain.   polyethylene glycol (MIRALAX / GLYCOLAX) 17 g packet Take 17 g by mouth daily as needed for mild constipation.   timolol (TIMOPTIC) 0.5 % ophthalmic solution Place 1 drop into both eyes in the morning.   umeclidinium-vilanterol (ANORO ELLIPTA) 62.5-25 MCG/ACT AEPB INHALE 1 PUFF BY MOUTH EVERY DAY   vancomycin (VANCOCIN) 1-5 GM/200ML-% SOLN Inject 200 mLs (1,000 mg total) into the vein every Monday, Wednesday, and Friday with hemodialysis.     Review of Systems      All other systems reviewed and are otherwise negative except as  noted above.  Physical Exam    VS:  BP 110/60 (BP Location: Left Arm, Patient Position: Sitting, Cuff Size: Normal)   Pulse 62   Ht '5\' 9"'  (1.753 m)   Wt 190 lb (86.2 kg)   BMI 28.06 kg/m  , BMI Body mass index is 28.06 kg/m.  Wt Readings from Last 3 Encounters:  08/09/22 190 lb (86.2 kg)  08/02/22 159 lb 3.2 oz (72.2 kg)  07/22/22 182 lb 15.7 oz (83 kg)     GEN: Well nourished, well developed, diaphoretic, cool extremities HEENT: normal. Neck: Supple, no JVD, carotid bruits, or masses. Cardiac: RRR, + systolic murmur heard best in the right upper chest, rubs, or gallops. No clubbing, cyanosis, edema.  Radials/PT 2+ and equal bilaterally.  Respiratory:  Respirations regular and unlabored, clear to auscultation bilaterally. GI: Soft, nontender, nondistended. MS: No deformity or atrophy. Skin: cool and moist, no rash. Neuro:  Strength and sensation are intact. Psych: Normal affect.  Assessment & Plan    Hypoglycemia -sugar was 38 in the office today -given a glucose tab, peanut butter crackers, and ginger ale -recheck it was 48  CAD -no chest pain or SOB -continue current medications including Lipitor 10 mg daily, Coreg 3.125 mg twice a day  Chronic systolic CHF -not fluid overloaded today on exam -he is a dialysis patient   Recurrent GI bleeding -We will get a CBC since he recently had surgery on his left foot due to MRSA  Essential hypertension -stable today -continue current medications  Chronic renal failure -will get a BMP today to evaluate -currently on dialysis  M, W, F  HIV -continue K[place]tar[place]  HLD -LDL 80 -due to follow-up lipid panel 02/2023  Mild mitral valve regurg -will need updated echo, will order today         Disposition: Follow up 6 months with Jacob Rouge, MD or APP.  Signed, Elgie Collard, PA-C 08/09/2022, 4:19 PM [facility]

## 2022-08-09 ENCOUNTER — Encounter: Payer: Self-pay | Admitting: Physician Assistant

## 2022-08-09 ENCOUNTER — Encounter: Payer: Medicare Other | Attending: Gastroenterology | Admitting: Physician Assistant

## 2022-08-09 VITALS — BP 110/60 | HR 62 | Ht 69.0 in | Wt 190.0 lb

## 2022-08-09 DIAGNOSIS — I5022 Chronic systolic (congestive) heart failure: Secondary | ICD-10-CM | POA: Diagnosis not present

## 2022-08-09 DIAGNOSIS — I10 Essential (primary) hypertension: Secondary | ICD-10-CM | POA: Diagnosis not present

## 2022-08-09 DIAGNOSIS — E785 Hyperlipidemia, unspecified: Secondary | ICD-10-CM | POA: Insufficient documentation

## 2022-08-09 DIAGNOSIS — B20 Human immunodeficiency virus [HIV] disease: Secondary | ICD-10-CM | POA: Insufficient documentation

## 2022-08-09 DIAGNOSIS — Z79899 Other long term (current) drug therapy: Secondary | ICD-10-CM | POA: Insufficient documentation

## 2022-08-09 DIAGNOSIS — I2510 Atherosclerotic heart disease of native coronary artery without angina pectoris: Secondary | ICD-10-CM | POA: Diagnosis not present

## 2022-08-09 DIAGNOSIS — E1122 Type 2 diabetes mellitus with diabetic chronic kidney disease: Secondary | ICD-10-CM | POA: Insufficient documentation

## 2022-08-09 DIAGNOSIS — N184 Chronic kidney disease, stage 4 (severe): Secondary | ICD-10-CM | POA: Insufficient documentation

## 2022-08-09 NOTE — Patient Instructions (Signed)
Medication Instructions:  Your physician recommends that you continue on your current medications as directed. Please refer to the Current Medication list given to you today.  *If you need a refill on your cardiac medications before your next appointment, please call your pharmacy*   Lab Work: CBC and BMP today If you have labs (blood work) drawn today and your tests are completely normal, you will receive your results only by: [place] (if you have MyChart) OR A paper copy in the mail If you have any lab test that is abnormal or we need to change your treatment, we will call you to review the results.   Testing/Procedures: Your physician has requested that you have an echocardiogram. Echocardiography is a painless test that uses sound waves to create images of your heart. It provides your doctor with information about the size and shape of your heart and how well your heart's chambers and valves are working. This procedure takes approximately one hour. There are no restrictions for this procedure. Please do NOT wear cologne, perfume, aftershave, or lotions (deodorant is allowed). Please arrive 15 minutes prior to your appointment time.    Follow-Up: At [facility], you and your health needs are our priority.  As part of our continuing mission to provide you with exceptional heart care, we have created designated Provider Care Teams.  These Care Teams include your primary Cardiologist (physician) and Advanced Practice Providers (APPs -  Physician Assistants and Nurse Practitioners) who all work together to provide you with the care you need, when you need it.  We recommend signing up for the patient portal called "MyChart".  Sign up information is provided on this After Visit Summary.  MyChart is used to connect with patients for Virtual Visits (Telemedicine).  Patients are able to view lab/test results, encounter notes, upcoming appointments, etc.  Non-urgent messages can be  sent to your provider as well.   To learn more about what you can do with MyChart, go to [email or web address].    Your next appointment:   6 month(s)  The format for your next appointment:   In Person  Provider:   Jenkins Rouge, MD    Important Information About Sugar

## 2022-08-10 ENCOUNTER — Other Ambulatory Visit: Payer: Self-pay | Admitting: Cardiovascular Disease

## 2022-08-10 DIAGNOSIS — N186 End stage renal disease: Secondary | ICD-10-CM | POA: Diagnosis not present

## 2022-08-10 DIAGNOSIS — E11621 Type 2 diabetes mellitus with foot ulcer: Secondary | ICD-10-CM | POA: Diagnosis not present

## 2022-08-10 DIAGNOSIS — N2581 Secondary hyperparathyroidism of renal origin: Secondary | ICD-10-CM | POA: Diagnosis not present

## 2022-08-10 DIAGNOSIS — D631 Anemia in chronic kidney disease: Secondary | ICD-10-CM | POA: Diagnosis not present

## 2022-08-10 DIAGNOSIS — M868X8 Other osteomyelitis, other site: Secondary | ICD-10-CM | POA: Diagnosis not present

## 2022-08-10 DIAGNOSIS — E1169 Type 2 diabetes mellitus with other specified complication: Secondary | ICD-10-CM | POA: Diagnosis not present

## 2022-08-10 DIAGNOSIS — E782 Mixed hyperlipidemia: Secondary | ICD-10-CM | POA: Diagnosis not present

## 2022-08-10 DIAGNOSIS — Z992 Dependence on renal dialysis: Secondary | ICD-10-CM | POA: Diagnosis not present

## 2022-08-10 DIAGNOSIS — E1122 Type 2 diabetes mellitus with diabetic chronic kidney disease: Secondary | ICD-10-CM | POA: Diagnosis not present

## 2022-08-10 DIAGNOSIS — B9561 Methicillin susceptible Staphylococcus aureus infection as the cause of diseases classified elsewhere: Secondary | ICD-10-CM | POA: Diagnosis not present

## 2022-08-10 DIAGNOSIS — D649 Anemia, unspecified: Secondary | ICD-10-CM | POA: Diagnosis not present

## 2022-08-10 DIAGNOSIS — L97516 Non-pressure chronic ulcer of other part of right foot with bone involvement without evidence of necrosis: Secondary | ICD-10-CM | POA: Diagnosis not present

## 2022-08-10 DIAGNOSIS — M86671 Other chronic osteomyelitis, right ankle and foot: Secondary | ICD-10-CM | POA: Diagnosis not present

## 2022-08-10 LAB — BASIC METABOLIC PANEL
BUN/Creatinine Ratio: 5 — ABNORMAL LOW (ref 10–24)
BUN: 44 mg/dL — ABNORMAL HIGH (ref 8–27)
CO2: 27 mmol/L (ref 20–29)
Calcium: 9.7 mg/dL (ref 8.6–10.2)
Chloride: 90 mmol/L — ABNORMAL LOW (ref 96–106)
Creatinine, Ser: 8.44 mg/dL — ABNORMAL HIGH (ref 0.76–1.27)
Glucose: 74 mg/dL (ref 70–99)
Potassium: 5.5 mmol/L — ABNORMAL HIGH (ref 3.5–5.2)
Sodium: 136 mmol/L (ref 134–144)
eGFR: 6 mL/min/{1.73_m2} — ABNORMAL LOW (ref >59)

## 2022-08-10 LAB — CBC
Hematocrit: 28.6 % — ABNORMAL LOW (ref 37.5–51.0)
Hemoglobin: 9.3 g/dL — ABNORMAL LOW (ref 13.0–17.7)
MCH: 33.8 pg — ABNORMAL HIGH (ref 26.6–33.0)
MCHC: 32.5 g/dL (ref 31.5–35.7)
MCV: 104 fL — ABNORMAL HIGH (ref 79–97)
Platelets: 129 10*3/uL — ABNORMAL LOW (ref 150–450)
RBC: 2.75 x10E6/uL — ABNORMAL LOW (ref 4.14–5.80)
RDW: 14.4 % (ref 11.6–15.4)
WBC: 6.1 10*3/uL (ref 3.4–10.8)

## 2022-08-11 ENCOUNTER — Other Ambulatory Visit: Payer: Self-pay | Admitting: Gastroenterology

## 2022-08-11 ENCOUNTER — Encounter (HOSPITAL_BASED_OUTPATIENT_CLINIC_OR_DEPARTMENT_OTHER): Payer: Medicare Other | Admitting: Internal Medicine

## 2022-08-11 ENCOUNTER — Other Ambulatory Visit (HOSPITAL_COMMUNITY): Payer: Self-pay

## 2022-08-11 DIAGNOSIS — E11621 Type 2 diabetes mellitus with foot ulcer: Secondary | ICD-10-CM | POA: Diagnosis not present

## 2022-08-11 DIAGNOSIS — E1151 Type 2 diabetes mellitus with diabetic peripheral angiopathy without gangrene: Secondary | ICD-10-CM | POA: Diagnosis not present

## 2022-08-11 DIAGNOSIS — M109 Gout, unspecified: Secondary | ICD-10-CM | POA: Diagnosis not present

## 2022-08-11 DIAGNOSIS — T8189XA Other complications of procedures, not elsewhere classified, initial encounter: Secondary | ICD-10-CM | POA: Diagnosis not present

## 2022-08-11 DIAGNOSIS — N2581 Secondary hyperparathyroidism of renal origin: Secondary | ICD-10-CM | POA: Diagnosis not present

## 2022-08-11 DIAGNOSIS — Z992 Dependence on renal dialysis: Secondary | ICD-10-CM | POA: Diagnosis not present

## 2022-08-11 DIAGNOSIS — E1122 Type 2 diabetes mellitus with diabetic chronic kidney disease: Secondary | ICD-10-CM | POA: Diagnosis not present

## 2022-08-11 DIAGNOSIS — Z794 Long term (current) use of insulin: Secondary | ICD-10-CM | POA: Diagnosis not present

## 2022-08-11 DIAGNOSIS — I132 Hypertensive heart and chronic kidney disease with heart failure and with stage 5 chronic kidney disease, or end stage renal disease: Secondary | ICD-10-CM | POA: Diagnosis not present

## 2022-08-11 DIAGNOSIS — M1990 Unspecified osteoarthritis, unspecified site: Secondary | ICD-10-CM | POA: Diagnosis not present

## 2022-08-11 DIAGNOSIS — I255 Ischemic cardiomyopathy: Secondary | ICD-10-CM | POA: Diagnosis not present

## 2022-08-11 DIAGNOSIS — L97512 Non-pressure chronic ulcer of other part of right foot with fat layer exposed: Secondary | ICD-10-CM | POA: Diagnosis not present

## 2022-08-11 DIAGNOSIS — N186 End stage renal disease: Secondary | ICD-10-CM | POA: Diagnosis not present

## 2022-08-11 DIAGNOSIS — E1140 Type 2 diabetes mellitus with diabetic neuropathy, unspecified: Secondary | ICD-10-CM | POA: Diagnosis not present

## 2022-08-11 DIAGNOSIS — M86671 Other chronic osteomyelitis, right ankle and foot: Secondary | ICD-10-CM | POA: Diagnosis not present

## 2022-08-11 DIAGNOSIS — I5042 Chronic combined systolic (congestive) and diastolic (congestive) heart failure: Secondary | ICD-10-CM | POA: Diagnosis not present

## 2022-08-11 NOTE — Progress Notes (Signed)
SHAYN, MADOLE ([id number]) [id number] Page 1 of 9 Visit Report for 08/11/2022 HPI Details Patient Name: Date of Service: Jacob [name], Jacob [name] 08/11/2022 8:15 A M Medical Record Number: [id number] Patient Account Number: [id number] Date of Birth/Sex: Treating RN: Mar 26, 1951 (71 y.o. M) Primary Care Provider: Alain Honey Other Clinician: Referring Provider: Treating Provider/Extender: Antonietta Breach in Treatment: 23 History of Present Illness Location: 2 ulcers noted to the left hallux Quality: the patient denies pain Duration: these were originally blisters towards the end of February and opened in early March Context: these wounds are secondary to pressure; he got new shoes in February and also started wearing a toe cover/sleeve Modifying Factors: pressure and friction are exacerbating factor HPI Description: 01/12/17- Mr. Whitner arrives for initial evaluation of 2 ulcers to his left hallux. He states that back in February he did receive a new pair of diabetic shoes and at that time he started wearing toe cover/sleeves and noticed development of a blister to the dorsal and plantar aspect. He has been seen at [facility] by both Dr. Amalia Hailey and Dr. Jacqualyn Posey. He last saw Dr.Wagoner on 3/8 at which time he was prescribed Silvadene and Keflex. He completed that dose of Keflex and states that he got a phone call to extend the Keflex prior to this appointment. He has a follow-up with Dr. Amalia Hailey on 3/26. He has been wearing a surgical shoe, no significant offloading to the plantar/medial ulcer but the strap does not extend over the dorsal ulcer. Topically he has been applying the Silvadene daily and a dry dressing. He is a diabetic, on insulin, with a recent A1c of 6.2% (11/10/16). He is a current smoker, 0.5 PPD. He has no known diagnosis of arterial insufficiency. 01/20/17- The patient arrives for follow up evaluation of his ulcers to the left  hallux. He admits to continuing to smoke approximately 0.5ppd. He states that his diet has been somewhat uncontrolled with higher than normal glucose levels, this seems circumstantial as he had a death in the family as food and schedule has been disrupted. he admits to wearing the surgical show with offloading modifications while out of the home, but has been ambulating barefoot around the home. he saw Dr. Amalia Hailey yesterday for toenail trimming and callus shaving to the right plantar foot and investigation of his ulcers. 01/26/17- he is here for follow-up evaluation of his left hallux ulcers. He states he has been significantly more compliant in the use of his offloading surgical shoe, with the exception of "getting up for a glass of water" he states that he has worn it with any ambulation. He states he has decreased his smoking too, admitting to only a cigarette since yesterday. He states his blood sugar this morning was 147 and yesterday was 116. He is voicing no complaints or concerns 02/02/17- He is here for follow-up evaluation of his left hallux ulcers. The dorsal ulcer is crusted over/healed. He states he has minimized his ambulation over this past week, much less than the previous week. He states his glucose levels have been between 120-150 about half of the time, otherwise greater than 150. He cannot correlate any specific dietary changes. He does have an appointment with a dietitian next month. He was advised to keep a log of his glucose levels and his diet for that appointment. 02/06/17; patient comes in today for first application of the total contact cast. Her intake nurse reported the wounds look better therefore I did not  actually see these before application of the cast. He'll return on Thursday for reapplication in the standard fashion 02/10/27- he is here for follow-up evaluation of his left plantar hallux ulcer. He came in Monday for application of a total contact cast. He states that he  was tolerating that Monday, but noticed on Tuesday that his foot began to slide in the cast. He also is complaining of the heaviness of the cast, as he ambulates as his primary form of transportation. He continues to smoke,. He has not contacted his cardiologist regarding Nicorette gum. He states his blood sugars have been slightly elevated, this morning being 206. He states that he will complete an antibiotic tonight. He states this was prescribed by Dr. Jacqualyn Posey. He states that he has refilled this prescription twice, without direction to continue taking this medication. 02/16/17; patient wound about the same as last week per her description. Rolled edges of callus scan and nonviable subcutaneous tissue. Using Prisma. Apparently used topical antibiotic instead of the Prisma over the course of this week. He has not a total contact cast candidate secondary to ambulation needs and unsteadiness on his feet per discussion with our intake nurse 03/02/17 patient requires debridement. He has a small wound on the right great toe which probes however does not approach bone. What I can see of the base of this wound looks reasonably healthy and I think this is largely in offloading issue with the second toe crossing over the first. He did not tolerate a total contact cast has balance issues 5/171/8. 0.2x0.2x0.2 less depth. Using prisma 03/16/17; no major change. Still a small probing wound with copious amounts of surrounding callus skin nonviable subcutaneous tissue all of which debrided. Still using Prisma. I suspect the dimensions post debridement are about the same as listed above 0.2 x 0.2 x 0.2 03/23/17; no major change using Prisma. Once again a large amount of callus nonviable subcutaneous tissue around the wound. I went over the offloading issues with the patient and the nurse. He did not tolerate a total contact cast he uses public transportation among other issues. 03/30/17; no major change using Prisma.  Once again a large amount of callus and nonviable tissue around the wound requires debridement. Saw Dr. Amalia Hailey who pared calluses on the other foot. 04/06/17; the wound is much smaller there is callus around this although I think I can see the base of the wound and I elected not to do any debridement. 04/13/17 this is a wound I did not debridement last week left callus intact. He arrives today with the area much in the same predicament however this time I remove the callus to find a deep probing hole. This is disappointing this is not go to bone. We have been using Prisma. 04/20/17 plantar left first toe. Again although a lot of circumferential callus around the wound which requires debridement however this does not appear to be a probing area. Culture I did last week show coag-negative staph. I thought this was probably not something that was worth treating but In the interim he was seen by his primary care with parotitis. He is now on clindamycin and Cipro and the Staphylococcus we cultured is Cipro sensitive [lugdunensis} 04/27/17; plantar left first toe. Again a lot of circumferential callus around a small open area. Removing the callus also nonviable subcutaneous tissue. The wound looks better today. He has completed his antibiotics for the parotitis. I didn't think additional antibiotics were necessary in terms of the wound 05/11/17; plantar  first toe. I thought this might be closed this week however once again he has a deep probing small hole. Using pickups and a scalpel debridement of thick callus and subcutaneous tissue from around the wound reveals a depth of about 0.4 cm. We have not been able to offload the area. There is no obvious infection here. No evidence of ischemia 05/18/17; since his last visit he's been in hospital for oral surgery apparently to remove a retained surgical screw. He has swelling of his jaw and has some discomfort. He arrives today with the plantar left first toe in the same  predicament. No major change. Each time he comes in here he has thick adherent callus 05/25/17; arrives today with roughly the same condition of the plantar left great toe. Perhaps somewhat better less callus. There are no options for offloading. He is on his feet a lot and I think this is largely a pressure issue X-ray I did last week showed no evidence of osteomyelitis. He has a toe separator as the great toe tends to ride under the second toe when he is walking. I'm hopeful that this will allow for a little less pressure on the plantar aspect of the left great toe ROVERTO, BODMER ([id number]) [id number] Page 2 of 9 06/07/2017 -- he is working hard on giving up smoking and other than that has been wearing his diabetic shoes and is also trying to offload as much as possible. 06/15/17; patient arrived in clinic today with a difficult area on his left medial great toe closed over. There is still amount of callus on this however I don't believe there is any underlying open wound. He has custom shoes with inserts. I still think he is going to need to offload this area going forward. He also follows with Dr. Amalia Hailey of podiatry for routine foot care READMISSION 11/01/16; patient arrives in clinic today with a five-day history of a new area on the previously problematic left great toe. He is wearing his diabetic shoes and he apparently has a new pair coming. The patient is a type II diabetic with neuropathy. He follows with Dr. Amalia Hailey of podiatry for pre-ulcerative callus on his bilateral feet last seen on 08/24/17. He has developed a large blister on top of his left great toe this is since gone down somewhat. He is not been dressing this specifically. He does not have a history of significant PAD previous ABIs in this clinic were1.1 11/06/17; left great toe has epithelialized since last time. I think this was caused by a insoles an issue pushing the toe against the dorsal aspect of  the shoe itself has new diabetic shoes coming hopefully will have more forefoot depth. READMISSION 03/01/2022 This is a now 71 year old man with a past medical history significant for type 2 diabetes mellitus with end-stage renal disease on hemodialysis as as well as congestive heart failure, ischemic cardiomyopathy, hypertension, and prior diabetic foot ulcers. He has had multiple issues with his hemodialysis access. He had a prior left radiocephalic fistula that failed. In October 2020, he had a left brachiobasilic fistula. He apparently underwent a procedure at an outside vascular office on November 09, 2021. Subsequent to that, he developed a wound on his left posterior forearm just distal to the elbow as well as wounds on 2 of his left digits. He was diagnosed with steal syndrome and recently underwent ligation of his fistula with placement of a hemodialysis catheter. He is here today for assistance in healing the wounds  that developed related to steal syndrome. He says that he has been applying Neosporin to the sites and they are fairly sensitive to the touch. 03/08/2022: The wounds on his fingers are much smaller and have just a little bit of accumulated thin eschar. The wound on his left posterior forearm is also smaller but has accumulated both slough and eschar. He also has a thick callus on his right fifth metatarsal head plantar surface and unfortunately, it appears that there is an open wound underneath the callus. We have been using Iodosorb with Hydrofera Blue to all of his open wounds. Pain is much improved from last week. 03/15/2022: The wounds on his fingers have healed. The wound on his left posterior forearm is smaller and has a nice layer of granulation tissue with just a bit of accumulated slough. He has built up additional callus around the right fifth metatarsal head plantar surface, but the underlying wound has a good surface with granulation tissue present. We have been using  Iodosorb with Hydrofera Blue. He did not endorse any pain this week. 03/22/2022: The wound on his left posterior forearm continues to contract. There is good granulation tissue present and just a little bit of periwound eschar and overlying slough. The right fifth metatarsal head wound has once again accumulated some callus and there is slough within the wound. The intake nurse measured a area of undermining from 7-10 o'clock. 03/29/2022: The patient saw podiatry this week for nail trim and they also pared back a number of calluses. The wound on his left posterior forearm is substantially smaller with just some overlying thin eschar. The right fifth metatarsal head has a bit of callus accumulation as well as some slough in the wound bed but is otherwise clean. It is smaller today, as well. 04/05/2022: The wound on his left posterior forearm is nearly closed with just a small amount of overlying eschar. The right fifth metatarsal head wound is smaller again this week. There is some periwound callus accumulation and some slough in the wound bed. He is going to get new dialysis access placed next week. 04/19/2022: The wound on his left posterior forearm is closed. The right fifth metatarsal head wound is about the same size with periwound callus and slough accumulation. He did have a new right forearm PTFE AV graft placed last week. His right forearm and hand are quite swollen, warm, and red today. He was apparently seen in the emergency room yesterday and they did not think it was infected and attributed the swelling to normal postop findings. 04/28/2022: The wound on his foot has accumulated a lot of periwound callus, but the actual open surface is quite small. There is just a little bit of slough on the surface. 05/03/2022: His wound is even smaller today with less periwound callus accumulation. He continues to build up slough but the underlying tissue is healthy and viable-appearing. 05/10/2022: The orifice  of the wound continues to contract, but there is some undermining and periwound callus formation. No significant accumulation of slough. 05/17/2022: The wound is about the same size today, perhaps slightly smaller. He does have periwound callus accumulation, but it does not overlap or create any undermining. There is some slough in the base of the wound. 05/31/2022: The wound measured larger today on intake, but this may be secondary to the fairly aggressive debridement I performed at his last visit. Despite this, he has built up callus around the wound again. There is some epiboly around the orifice. There  is slough within the wound and some undermining is present. His blood sugar remains very poorly controlled; it was 300 today. 06/16/2022: No real change in the size of his wound. He has callus accumulation around the perimeter. Last visit we changed his dressing to Prisma; I have not seen much in the way of filling in from the base yet. 06/30/2022: No real change to his wound. There is some undermining that has been created by accumulated callus. We are struggling with poor glucose control in the setting of immunodeficiency and end-stage renal disease on dialysis. 07/14/2022: The wound has gotten deeper and a probe placed into the wound can be palpated coming to just under the skin on the dorsal aspect of his foot. The wound itself is fairly clean and there is no malodor or purulent drainage present. He is scheduled to see podiatry next week and hopefully get x-rays performed at that visit. He is also requesting a knee scooter. 07/26/2022: A couple of days after our last visit, he presented to the emergency department with worsening of his wound. Radiographic imaging was consistent with osteomyelitis. He was taken to the operating room by Dr. Loel Lofty of podiatry. He underwent irrigation and debridement of his right foot wound and resection of the right fourth metatarsal head and proximal phalanx.  Cultures grew out staph. He is currently receiving IV vancomycin and ceftazidime with hemodialysis. The wound is very clean today. There is no slough buildup. No purulent drainage. It does probe through to the dorsum of his foot to his surgical incision. Sutures are still in place. 08/04/2022: The surgical incision has partially dehisced. There are still Prolene sutures in situ. There is some slough accumulation on the open surface behind the sutures. On the plantar surface, the wound has accumulated a thick periwound callus. There is some undermining present. 10/19; surgical wound plantar fourth met head right foot. He has had a ray resection apparently. The wound probes through to his dorsal foot. In spite of this the patient appears well. He is still on antibiotics at dialysis apparently vancomycin and ceftriaxone. We are packing this with iodoform SAATHVIK, EVERY ([id number]) [id number] Page 3 of 9 Electronic Signature(s) Signed: 08/11/2022 4:29:29 PM By: Linton Ham MD Entered By: Linton Ham on 08/11/2022 09:35:51 -------------------------------------------------------------------------------- Physical Exam Details Patient Name: Date of Service: [name] 08/11/2022 8:15 A M Medical Record Number: [id number] Patient Account Number: [id number] Date of Birth/Sex: Treating RN: [birth year] (71 y.o. M) Primary Care Provider: Alain Honey Other Clinician: Referring Provider: Treating Provider/Extender: Antonietta Breach in Treatment: 23 Constitutional Sitting or standing Blood Pressure is within target range for patient.. Pulse regular and within target range for patient.[name] Respirations regular, non-labored and within target range.. Temperature is normal and within the target range for the patient.[name] Appears in no distress. Notes Wound exam; surgical wound probes straight through to the dorsal part of his foot. Under  illumination things actually look fairly healthy. There is no drainage no evidence of surrounding infection. Electronic Signature(s) Signed: 08/11/2022 4:29:29 PM By: Linton Ham MD Entered By: Linton Ham on 08/11/2022 09:36:51 -------------------------------------------------------------------------------- Physician Orders Details Patient Name: Date of Service: [name] 08/11/2022 8:15 A M Medical Record Number: [id number] Patient Account Number: [id number] Date of Birth/Sex: Treating RN: [birth year] (71 y.o. [name] Primary Care Provider: Alain Honey Other Clinician: Referring Provider: Treating Provider/Extender: Antonietta Breach in Treatment: 23 Verbal / Phone Orders: No Diagnosis Coding ICD-10  Coding Code Description Z61.096 Non-pressure chronic ulcer of other part of right foot with fat layer exposed E11.22 Type 2 diabetes mellitus with diabetic chronic kidney disease J44.9 Chronic obstructive pulmonary disease, unspecified I50.42 Chronic combined systolic (congestive) and diastolic (congestive) heart failure I10 Essential (primary) hypertension N25.81 Secondary hyperparathyroidism of renal origin N18.6 End stage renal disease B20 Human immunodeficiency virus [HIV] disease M86.671 Other chronic osteomyelitis, right ankle and foot Follow-up Appointments ppointment in 1 week. - Dr. Celine Ahr RM 1 with [name] Return A Anesthetic Wound #8 Right,Plantar Foot (In clinic) Topical Lidocaine 4% applied to wound bed [name] ([id number]) [id number] Page 4 of 9 Bathing/ Shower/ Hygiene Other Bathing/Shower/Hygiene Orders/Instructions: - Change dressing after bathing Off-Loading Other: - minimal weight bearing right foot Home Health dmit to [facility] for wound care. May utilize formulary equivalent dressing for wound treatment orders unless otherwise specified. - A pt has dialysis MWF  mornings No change in wound care orders this week; continue Home Health for wound care. May utilize formulary equivalent dressing for wound treatment orders unless otherwise specified. Other Home Health Orders/Instructions: - [facility] Wound Treatment Wound #8 - Foot Wound Laterality: Plantar, Right Cleanser: Soap and Water 3 x Per Week/30 Days Discharge Instructions: May shower and wash wound with dial antibacterial soap and water prior to dressing change. Cleanser: Wound Cleanser (Generic) 3 x Per Week/30 Days Discharge Instructions: Cleanse the wound with wound cleanser prior to applying a clean dressing using gauze sponges, not tissue or cotton balls. Prim Dressing: Iodoform packing strip 1/2 (in) 3 x Per Week/30 Days ary Discharge Instructions: Lightly pack as instructed Secondary Dressing: Optifoam Non-Adhesive Dressing, 4x4 in 3 x Per Week/30 Days Discharge Instructions: Apply over primary dressing cut to make foam donut Secondary Dressing: Woven Gauze Sponges 2x2 in 3 x Per Week/30 Days Discharge Instructions: Apply over primary dressing as directed. Secured With: Elastic Bandage 4 inch (ACE bandage) 3 x Per Week/30 Days Discharge Instructions: Secure with ACE bandage as directed. Secured With: 12M Medipore H Soft Cloth Surgical T ape, 4 x 10 (in/yd) 3 x Per Week/30 Days Discharge Instructions: Secure with tape as directed. Wound #9 - Foot Wound Laterality: Dorsal, Right Prim Dressing: Gauze Non-Woven Sterile Sponge 4x4 (in/in) ary 3 x Per Week/30 Days Secured With: [employer], 4.5x3.1 (in/yd) 3 x Per Week/30 Days Discharge Instructions: Secure with Kerlix as directed. Electronic Signature(s) Signed: 08/11/2022 4:29:29 PM By: Linton Ham MD Signed: 08/11/2022 5:12:18 PM By: Baruch Gouty RN, BSN Entered By: Baruch Gouty on 08/11/2022 09:06:39 -------------------------------------------------------------------------------- Problem List Details Patient Name: Date  of Service: [name] 08/11/2022 8:15 A M Medical Record Number: [id number] Patient Account Number: [id number] Date of Birth/Sex: Treating RN: Dec 02, 1950 (71 y.o. [name] Primary Care Provider: Alain Honey Other Clinician: Referring Provider: Treating Provider/Extender: Antonietta Breach in Treatment: 23 Active Problems ICD-10 Encounter Code Description Active Date MDM Diagnosis [phone] Non-pressure chronic ulcer of other part of right foot with fat layer exposed 03/08/2022 No Yes JOVANNIE, ULIBARRI ([id number]) [id number] Page 5 of 9 E11.22 Type 2 diabetes mellitus with diabetic chronic kidney disease 03/01/2022 No Yes J44.9 Chronic obstructive pulmonary disease, unspecified 03/01/2022 No Yes I50.42 Chronic combined systolic (congestive) and diastolic (congestive) heart failure 03/01/2022 No Yes I10 Essential (primary) hypertension 03/01/2022 No Yes N25.81 Secondary hyperparathyroidism of renal origin 03/01/2022 No Yes N18.6 End stage renal disease 03/01/2022 No Yes B20 Human immunodeficiency virus [HIV] disease 03/01/2022 No Yes M86.671 Other chronic osteomyelitis, right  ankle and foot 07/26/2022 No Yes Inactive Problems Resolved Problems ICD-10 Code Description Active Date Resolved Date L98.499 Non-pressure chronic ulcer of skin of other sites with unspecified severity 03/01/2022 03/01/2022 Electronic Signature(s) Signed: 08/11/2022 4:29:29 PM By: Linton Ham MD Entered By: Linton Ham on 08/11/2022 09:33:58 -------------------------------------------------------------------------------- Progress Note Details Patient Name: Date of Service: [name] 08/11/2022 8:15 A M Medical Record Number: [id number] Patient Account Number: [id number] Date of Birth/Sex: Treating RN: 1950-12-18 (71 y.o. M) Primary Care Provider: Alain Honey Other Clinician: Referring Provider: Treating Provider/Extender: Antonietta Breach in Treatment: 23 Subjective History of Present Illness (HPI) The following HPI elements were documented for the patient's wound: Location: 2 ulcers noted to the left hallux Quality: the patient denies pain Duration: these were originally blisters towards the end of February and opened in early March Context: these wounds are secondary to pressure; he got new shoes in February and also started wearing a toe cover/sleeve Modifying Factors: pressure and friction are exacerbating factor 01/12/17- Mr. Shannahan arrives for initial evaluation of 2 ulcers to his left hallux. He states that back in February he did receive a new pair of diabetic shoes and at that time he started wearing toe cover/sleeves and noticed development of a blister to the dorsal and plantar aspect. He has been seen at [facility] Concepcion DAYSHON, ROBACK ([id number]) [id number] Page 6 of [address] Amalia Hailey and Dr. Jacqualyn Posey. He last saw Dr.Wagoner on 3/8 at which time he was prescribed Silvadene and Keflex. He completed that dose of Keflex and states that he got a phone call to extend the Keflex prior to this appointment. He has a follow-up with Dr. Amalia Hailey on 3/26. He has been wearing a surgical shoe, no significant offloading to the plantar/medial ulcer but the strap does not extend over the dorsal ulcer. Topically he has been applying the Silvadene daily and a dry dressing. He is a diabetic, on insulin, with a recent A1c of 6.2% (11/10/16). He is a current smoker, 0.5 PPD. He has no known diagnosis of arterial insufficiency. 01/20/17- The patient arrives for follow up evaluation of his ulcers to the left hallux. He admits to continuing to smoke approximately 0.5ppd. He states that his diet has been somewhat uncontrolled with higher than normal glucose levels, this seems circumstantial as he had a death in the family as food and schedule has been disrupted. he admits to wearing  the surgical show with offloading modifications while out of the home, but has been ambulating barefoot around the home. he saw Dr. Amalia Hailey yesterday for toenail trimming and callus shaving to the right plantar foot and investigation of his ulcers. 01/26/17- he is here for follow-up evaluation of his left hallux ulcers. He states he has been significantly more compliant in the use of his offloading surgical shoe, with the exception of "getting up for a glass of water" he states that he has worn it with any ambulation. He states he has decreased his smoking too, admitting to only a cigarette since yesterday. He states his blood sugar this morning was 147 and yesterday was 116. He is voicing no complaints or concerns 02/02/17- He is here for follow-up evaluation of his left hallux ulcers. The dorsal ulcer is crusted over/healed. He states he has minimized his ambulation over this past week, much less than the previous week. He states his glucose levels have been between 120-150 about half of the time, otherwise greater than 150. He cannot  correlate any specific dietary changes. He does have an appointment with a dietitian next month. He was advised to keep a log of his glucose levels and his diet for that appointment. 02/06/17; patient comes in today for first application of the total contact cast. Her intake nurse reported the wounds look better therefore I did not actually see these before application of the cast. He'll return on Thursday for reapplication in the standard fashion 02/10/27- he is here for follow-up evaluation of his left plantar hallux ulcer. He came in Monday for application of a total contact cast. He states that he was tolerating that Monday, but noticed on Tuesday that his foot began to slide in the cast. He also is complaining of the heaviness of the cast, as he ambulates as his primary form of transportation. He continues to smoke,. He has not contacted his cardiologist regarding  Nicorette gum. He states his blood sugars have been slightly elevated, this morning being 206. He states that he will complete an antibiotic tonight. He states this was prescribed by Dr. Jacqualyn Posey. He states that he has refilled this prescription twice, without direction to continue taking this medication. 02/16/17; patient wound about the same as last week per her description. Rolled edges of callus scan and nonviable subcutaneous tissue. Using Prisma. Apparently used topical antibiotic instead of the Prisma over the course of this week. He has not a total contact cast candidate secondary to ambulation needs and unsteadiness on his feet per discussion with our intake nurse 03/02/17 patient requires debridement. He has a small wound on the right great toe which probes however does not approach bone. What I can see of the base of this wound looks reasonably healthy and I think this is largely in offloading issue with the second toe crossing over the first. He did not tolerate a total contact cast has balance issues 5/171/8. 0.2x0.2x0.2 less depth. Using prisma 03/16/17; no major change. Still a small probing wound with copious amounts of surrounding callus skin nonviable subcutaneous tissue all of which debrided. Still using Prisma. I suspect the dimensions post debridement are about the same as listed above 0.2 x 0.2 x 0.2 03/23/17; no major change using Prisma. Once again a large amount of callus nonviable subcutaneous tissue around the wound. I went over the offloading issues with the patient and the nurse. He did not tolerate a total contact cast he uses public transportation among other issues. 03/30/17; no major change using Prisma. Once again a large amount of callus and nonviable tissue around the wound requires debridement. Saw Dr. Amalia Hailey who pared calluses on the other foot. 04/06/17; the wound is much smaller there is callus around this although I think I can see the base of the wound and I elected  not to do any debridement. 04/13/17 this is a wound I did not debridement last week left callus intact. He arrives today with the area much in the same predicament however this time I remove the callus to find a deep probing hole. This is disappointing this is not go to bone. We have been using Prisma. 04/20/17 plantar left first toe. Again although a lot of circumferential callus around the wound which requires debridement however this does not appear to be a probing area. Culture I did last week show coag-negative staph. I thought this was probably not something that was worth treating but In the interim he was seen by his primary care with parotitis. He is now on clindamycin and Cipro and the  Staphylococcus we cultured is Cipro sensitive [lugdunensis} 04/27/17; plantar left first toe. Again a lot of circumferential callus around a small open area. Removing the callus also nonviable subcutaneous tissue. The wound looks better today. He has completed his antibiotics for the parotitis. I didn't think additional antibiotics were necessary in terms of the wound 05/11/17; plantar first toe. I thought this might be closed this week however once again he has a deep probing small hole. Using pickups and a scalpel debridement of thick callus and subcutaneous tissue from around the wound reveals a depth of about 0.4 cm. We have not been able to offload the area. There is no obvious infection here. No evidence of ischemia 05/18/17; since his last visit he's been in hospital for oral surgery apparently to remove a retained surgical screw. He has swelling of his jaw and has some discomfort. He arrives today with the plantar left first toe in the same predicament. No major change. Each time he comes in here he has thick adherent callus 05/25/17; arrives today with roughly the same condition of the plantar left great toe. Perhaps somewhat better less callus. There are no options for offloading. He is on his feet a lot and  I think this is largely a pressure issue X-ray I did last week showed no evidence of osteomyelitis. He has a toe separator as the great toe tends to ride under the second toe when he is walking. I'm hopeful that this will allow for a little less pressure on the plantar aspect of the left great toe 06/07/2017 -- he is working hard on giving up smoking and other than that has been wearing his diabetic shoes and is also trying to offload as much as possible. 06/15/17; patient arrived in clinic today with a difficult area on his left medial great toe closed over. There is still amount of callus on this however I don't believe there is any underlying open wound. He has custom shoes with inserts. I still think he is going to need to offload this area going forward. He also follows with Dr. Amalia Hailey of podiatry for routine foot care READMISSION 11/01/16; patient arrives in clinic today with a five-day history of a new area on the previously problematic left great toe. He is wearing his diabetic shoes and he apparently has a new pair coming. The patient is a type II diabetic with neuropathy. He follows with Dr. Amalia Hailey of podiatry for pre-ulcerative callus on his bilateral feet last seen on 08/24/17. He has developed a large blister on top of his left great toe this is since gone down somewhat. He is not been dressing this specifically. He does not have a history of significant PAD previous ABIs in this clinic were1.1 11/06/17; left great toe has epithelialized since last time. I think this was caused by a insoles an issue pushing the toe against the dorsal aspect of the shoe itself has new diabetic shoes coming hopefully will have more forefoot depth. READMISSION 03/01/2022 This is a now 71 year old man with a past medical history significant for type 2 diabetes mellitus with end-stage renal disease on hemodialysis as as well as congestive heart failure, ischemic cardiomyopathy, hypertension, and prior diabetic foot  ulcers. He has had multiple issues with his hemodialysis access. He had a prior left radiocephalic fistula that failed. In October 2020, he had a left brachiobasilic fistula. He apparently underwent a procedure at an outside vascular office on November 09, 2021. Subsequent to that, he developed a wound on  his left posterior forearm just distal to the elbow as well as wounds on 2 of his left digits. He was diagnosed with steal syndrome and recently underwent ligation of his fistula with placement of a hemodialysis catheter. He is here today for assistance in healing the wounds that developed related to steal syndrome. He says that he has been applying Neosporin to the sites and they are fairly sensitive to the touch. 03/08/2022: The wounds on his fingers are much smaller and have just a little bit of accumulated thin eschar. The wound on his left posterior forearm is also smaller but has accumulated both slough and eschar. He also has a thick callus on his right fifth metatarsal head plantar surface and unfortunately, it appears that there is an open wound underneath the callus. We have been using Iodosorb with Hydrofera Blue to all of his open wounds. Pain is much improved from last week. 03/15/2022: The wounds on his fingers have healed. The wound on his left posterior forearm is smaller and has a nice layer of granulation tissue with just a bit JOSEFF, LUCKMAN ([id number]) [id number] Page 7 of 9 of accumulated slough. He has built up additional callus around the right fifth metatarsal head plantar surface, but the underlying wound has a good surface with granulation tissue present. We have been using Iodosorb with Hydrofera Blue. He did not endorse any pain this week. 03/22/2022: The wound on his left posterior forearm continues to contract. There is good granulation tissue present and just a little bit of periwound eschar and overlying slough. The right fifth metatarsal  head wound has once again accumulated some callus and there is slough within the wound. The intake nurse measured a area of undermining from 7-10 o'clock. 03/29/2022: The patient saw podiatry this week for nail trim and they also pared back a number of calluses. The wound on his left posterior forearm is substantially smaller with just some overlying thin eschar. The right fifth metatarsal head has a bit of callus accumulation as well as some slough in the wound bed but is otherwise clean. It is smaller today, as well. 04/05/2022: The wound on his left posterior forearm is nearly closed with just a small amount of overlying eschar. The right fifth metatarsal head wound is smaller again this week. There is some periwound callus accumulation and some slough in the wound bed. He is going to get new dialysis access placed next week. 04/19/2022: The wound on his left posterior forearm is closed. The right fifth metatarsal head wound is about the same size with periwound callus and slough accumulation. He did have a new right forearm PTFE AV graft placed last week. His right forearm and hand are quite swollen, warm, and red today. He was apparently seen in the emergency room yesterday and they did not think it was infected and attributed the swelling to normal postop findings. 04/28/2022: The wound on his foot has accumulated a lot of periwound callus, but the actual open surface is quite small. There is just a little bit of slough on the surface. 05/03/2022: His wound is even smaller today with less periwound callus accumulation. He continues to build up slough but the underlying tissue is healthy and viable-appearing. 05/10/2022: The orifice of the wound continues to contract, but there is some undermining and periwound callus formation. No significant accumulation of slough. 05/17/2022: The wound is about the same size today, perhaps slightly smaller. He does have periwound callus accumulation, but it does not  overlap  or create any undermining. There is some slough in the base of the wound. 05/31/2022: The wound measured larger today on intake, but this may be secondary to the fairly aggressive debridement I performed at his last visit. Despite this, he has built up callus around the wound again. There is some epiboly around the orifice. There is slough within the wound and some undermining is present. His blood sugar remains very poorly controlled; it was 300 today. 06/16/2022: No real change in the size of his wound. He has callus accumulation around the perimeter. Last visit we changed his dressing to Prisma; I have not seen much in the way of filling in from the base yet. 06/30/2022: No real change to his wound. There is some undermining that has been created by accumulated callus. We are struggling with poor glucose control in the setting of immunodeficiency and end-stage renal disease on dialysis. 07/14/2022: The wound has gotten deeper and a probe placed into the wound can be palpated coming to just under the skin on the dorsal aspect of his foot. The wound itself is fairly clean and there is no malodor or purulent drainage present. He is scheduled to see podiatry next week and hopefully get x-rays performed at that visit. He is also requesting a knee scooter. 07/26/2022: A couple of days after our last visit, he presented to the emergency department with worsening of his wound. Radiographic imaging was consistent with osteomyelitis. He was taken to the operating room by Dr. Loel Lofty of podiatry. He underwent irrigation and debridement of his right foot wound and resection of the right fourth metatarsal head and proximal phalanx. Cultures grew out staph. He is currently receiving IV vancomycin and ceftazidime with hemodialysis. The wound is very clean today. There is no slough buildup. No purulent drainage. It does probe through to the dorsum of his foot to his surgical incision. Sutures are still in  place. 08/04/2022: The surgical incision has partially dehisced. There are still Prolene sutures in situ. There is some slough accumulation on the open surface behind the sutures. On the plantar surface, the wound has accumulated a thick periwound callus. There is some undermining present. 10/19; surgical wound plantar fourth met head right foot. He has had a ray resection apparently. The wound probes through to his dorsal foot. In spite of this the patient appears well. He is still on antibiotics at dialysis apparently vancomycin and ceftriaxone. We are packing this with iodoform Objective Constitutional Sitting or standing Blood Pressure is within target range for patient.. Pulse regular and within target range for patient.[name] Respirations regular, non-labored and within target range.. Temperature is normal and within the target range for the patient.[name] Appears in no distress. Vitals Time Taken: 8:40 AM, Height: 74 in, Weight: 172 lbs, BMI: 22.1, Temperature: 97.9 F, Pulse: 82 bpm, Respiratory Rate: 18 breaths/min, Blood Pressure: 120/65 mmHg, Capillary Blood Glucose: 157 mg/dl. General Notes: glucose per pt report this am General Notes: Wound exam; surgical wound probes straight through to the dorsal part of his foot. Under illumination things actually look fairly healthy. There is no drainage no evidence of surrounding infection. Integumentary (Hair, Skin) Wound #8 status is Open. Original cause of wound was Blister. The date acquired was: 03/08/2022. The wound has been in treatment 22 weeks. The wound is located on the [place]. The wound measures 0.6cm length x 0.8cm width x 3.3cm depth; 0.377cm^2 area and 1.244cm^3 volume. There is Fat Layer (Subcutaneous Tissue) exposed. There is no tunneling or undermining noted.  There is a medium amount of serosanguineous drainage noted. The wound margin is thickened. There is large (67-100%) red granulation within the wound bed. There is a small  (1-33%) amount of necrotic tissue within the wound bed including Adherent Slough. The periwound skin appearance had no abnormalities noted for moisture. The periwound skin appearance had no abnormalities noted for color. The periwound skin appearance exhibited: Callus. Periwound temperature was noted as No Abnormality. The periwound has tenderness on palpation. Wound #9 status is Open. Original cause of wound was Surgical Injury. The date acquired was: 07/21/2022. The wound has been in treatment 2 weeks. The wound is located on the Right,Dorsal Foot. The wound measures 1cm length x 0.1cm width x 0.1cm depth; 0.079cm^2 area and 0.008cm^3 volume. There is Fat Layer (Subcutaneous Tissue) exposed. There is no tunneling or undermining noted. There is a medium amount of serosanguineous drainage noted. The wound LUCCIANO, VITALI ([id number]) [id number] Page 8 of 9 margin is distinct with the outline attached to the wound base. There is large (67-100%) red granulation within the wound bed. There is a small (1-33%) amount of necrotic tissue within the wound bed including Adherent Slough. The periwound skin appearance had no abnormalities noted for texture. The periwound skin appearance had no abnormalities noted for color. The periwound skin appearance exhibited: Dry/Scaly. The periwound skin appearance did not exhibit: Maceration. Periwound temperature was noted as No Abnormality. The periwound has tenderness on palpation. 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 Chronic obstructive pulmonary disease, unspecified Chronic combined systolic (congestive) and diastolic (congestive) heart failure Essential (primary) hypertension Secondary hyperparathyroidism of renal origin End stage renal disease Human immunodeficiency virus [HIV] disease Other  chronic osteomyelitis, right ankle and foot Plan Follow-up Appointments: Return Appointment in 1 week. - Dr. Celine Ahr RM 1 with [name] Anesthetic: Wound #8 Right,Plantar Foot: (In clinic) Topical Lidocaine 4% applied to wound bed Bathing/ Shower/ Hygiene: Other Bathing/Shower/Hygiene Orders/Instructions: - Change dressing after bathing Off-Loading: Other: - minimal weight bearing right foot Home Health: Admit to [name] for wound care. May utilize formulary equivalent dressing for wound treatment orders unless otherwise specified. - pt has dialysis MWF mornings No change in wound care orders this week; continue Home Health for wound care. May utilize formulary equivalent dressing for wound treatment orders unless otherwise specified. Other Home Health Orders/Instructions: - [facility] WOUND #8: - Foot Wound Laterality: Plantar, Right Cleanser: Soap and Water 3 x Per Week/30 Days Discharge Instructions: May shower and wash wound with dial antibacterial soap and water prior to dressing change. Cleanser: Wound Cleanser (Generic) 3 x Per Week/30 Days Discharge Instructions: Cleanse the wound with wound cleanser prior to applying a clean dressing using gauze sponges, not tissue or cotton balls. Prim Dressing: Iodoform packing strip 1/2 (in) 3 x Per Week/30 Days ary Discharge Instructions: Lightly pack as instructed Secondary Dressing: Optifoam Non-Adhesive Dressing, 4x4 in 3 x Per Week/30 Days Discharge Instructions: Apply over primary dressing cut to make foam donut Secondary Dressing: Woven Gauze Sponges 2x2 in 3 x Per Week/30 Days Discharge Instructions: Apply over primary dressing as directed. Secured With: Elastic Bandage 4 inch (ACE bandage) 3 x Per Week/30 Days Discharge Instructions: Secure with ACE bandage as directed. Secured With: 81M Medipore H Soft Cloth Surgical T ape, 4 x 10 (in/yd) 3 x Per Week/30 Days Discharge Instructions: Secure with tape as directed. WOUND #9: - Foot  Wound Laterality:  Dorsal, Right Prim Dressing: Gauze Non-Woven Sterile Sponge 4x4 (in/in) 3 x Per Week/30 Days ary Secured With: Kerlix Roll Sterile, 4.5x3.1 (in/yd) 3 x Per Week/30 Days Discharge Instructions: Secure with Kerlix as directed. 1. We continued with the iodoform packing, silver alginate probably would be the alternative 2. No obvious acute infection he is on vancomycin and Rocephin at dialysis 3. 1 would wonder about hyperbaric oxygen therapy in this man with a probing wound through his foot who is a type II diabetic. He is on hemodialysis Monday Wednesday and Friday which would make these days difficult however I did bring it up for future discussion. 4. He is offloading this "as best he can" Electronic Signature(s) Signed: 08/11/2022 4:29:29 PM By: Linton Ham MD Entered By: Linton Ham on 08/11/2022 09:38:28 [name] ([id number]) [id number] Page 9 of 9 -------------------------------------------------------------------------------- SuperBill Details Patient Name: Date of Service: DELDRICK, LINCH 08/11/2022 Medical Record Number: [id number] Patient Account Number: [id number] Date of Birth/Sex: Treating RN: 10-19-1951 (71 y.o. [name] Primary Care Provider: Alain Honey Other Clinician: Referring Provider: Treating Provider/Extender: Antonietta Breach in Treatment: 23 Diagnosis Coding ICD-10 Codes Code Description 220 145 1103 Non-pressure chronic ulcer of other part of right foot with fat layer exposed E11.22 Type 2 diabetes mellitus with diabetic chronic kidney disease J44.9 Chronic obstructive pulmonary disease, unspecified I50.42 Chronic combined systolic (congestive) and diastolic (congestive) heart failure I10 Essential (primary) hypertension N25.81 Secondary hyperparathyroidism of renal origin N18.6 End stage renal disease B20 Human immunodeficiency virus [HIV] disease M86.671 Other  chronic osteomyelitis, right ankle and foot Facility Procedures : 7 CPT4 Code: 2130865 Description: 99214 - WOUND CARE VISIT-LEV 4 EST PT Modifier: Quantity: 1 Physician Procedures : CPT4 Code Description Modifier [id number] - WC PHYS LEVEL 3 - EST PT ICD-10 Diagnosis Description L97.512 Non-pressure chronic ulcer of other part of right foot with fat layer exposed E11.22 Type 2 diabetes mellitus with diabetic chronic kidney  disease Quantity: 1 Electronic Signature(s) Signed: 08/11/2022 4:29:29 PM By: Linton Ham MD Entered By: Linton Ham on 08/11/2022 09:38:49

## 2022-08-11 NOTE — Progress Notes (Signed)
Jacob, [name] ([id number]) [id number] Page 1 of 11 Visit Report for 08/11/2022 Arrival Information Details Patient Name: Date of Service: Jacob [name], Jacob [name] 08/11/2022 8:15 A M Medical Record Number: [id number] Patient Account Number: [id number] Date of Birth/Sex: Treating RN: 1951/04/27 (71 y.o. [name] Primary Care Krishiv Sandler: Alain Honey Other Clinician: Referring Ahriana Gunkel: Treating Jamarquis Crull/Extender: Antonietta Breach in Treatment: 23 Visit Information History Since Last Visit Added or deleted any medications: No Patient Arrived: Jacob [name] Any new allergies or adverse reactions: No Arrival Time: 08:33 Had a fall or experienced change in No Accompanied By: self activities of daily living that may affect Transfer Assistance: None risk of falls: Patient Identification Verified: Yes Signs or symptoms of abuse/neglect since No Secondary Verification Process Completed: Yes last visito Patient Requires Transmission-Based Precautions: No Hospitalized since last visit: No Patient Has Alerts: No Implantable device outside of the clinic No excluding cellular tissue based products placed in the center since last visit: Has Dressing in Place as Prescribed: Yes Has Footwear/Offloading in Place as Yes Prescribed: Right: Surgical Shoe with Pressure Relief Insole Pain Present Now: No Electronic Signature(s) Signed: 08/11/2022 5:12:18 PM By: Baruch Gouty RN, BSN Entered By: Baruch Gouty on 08/11/2022 08:40:12 -------------------------------------------------------------------------------- Clinic Level of Care Assessment Details Patient Name: Date of Service: Jacob [name], Jacob [name] 08/11/2022 8:15 A M Medical Record Number: [id number] Patient Account Number: [id number] Date of Birth/Sex: Treating RN: [birth year] (71 y.o. [name] Primary Care Yajaira Doffing: Alain Honey Other Clinician: Referring Ariyana Faw: Treating  Briella Hobday/Extender: Antonietta Breach in Treatment: 23 Clinic Level of Care Assessment Items TOOL 4 Quantity Score '[]'$  - 0 Use when only an EandM is performed on FOLLOW-UP visit ASSESSMENTS - Nursing Assessment / Reassessment X- 1 10 Reassessment of Co-morbidities (includes updates in patient status) X- 1 5 Reassessment of Adherence to Treatment Plan ASSESSMENTS - Wound and Skin A ssessment / Reassessment '[]'$  - 0 Simple Wound Assessment / Reassessment - one wound X- 2 5 Complex Wound Assessment / Reassessment - multiple wounds '[]'$  - 0 Dermatologic / Skin Assessment (not related to wound area) Jacob [name] ([id number]) [id number] Page 2 of 11 ASSESSMENTS - Focused Assessment '[]'$  - 0 Circumferential Edema Measurements - multi extremities '[]'$  - 0 Nutritional Assessment / Counseling / Intervention X- 1 5 Lower Extremity Assessment (monofilament, tuning fork, pulses) '[]'$  - 0 Peripheral Arterial Disease Assessment (using hand held doppler) ASSESSMENTS - Ostomy and/or Continence Assessment and Care '[]'$  - 0 Incontinence Assessment and Management '[]'$  - 0 Ostomy Care Assessment and Management (repouching, etc.) PROCESS - Coordination of Care X - Simple Patient / Family Education for ongoing care 1 15 '[]'$  - 0 Complex (extensive) Patient / Family Education for ongoing care X- 1 10 Staff obtains Programmer, systems, Records, T Results / Process Orders est X- 1 10 Staff telephones HHA, Nursing Homes / Clarify orders / etc '[]'$  - 0 Routine Transfer to another Facility (non-emergent condition) '[]'$  - 0 Routine Hospital Admission (non-emergent condition) '[]'$  - 0 New Admissions / Biomedical engineer / Ordering NPWT Apligraf, etc. , '[]'$  - 0 Emergency Hospital Admission (emergent condition) X- 1 10 Simple Discharge Coordination '[]'$  - 0 Complex (extensive) Discharge Coordination PROCESS - Special Needs '[]'$  - 0 Pediatric / Minor Patient Management '[]'$  -  0 Isolation Patient Management '[]'$  - 0 Hearing / Language / Visual special needs '[]'$  - 0 Assessment of Community assistance (transportation, D/C planning, etc.) '[]'$  - 0 Additional assistance / Altered mentation '[]'$  -  0 Support Surface(s) Assessment (bed, cushion, seat, etc.) INTERVENTIONS - Wound Cleansing / Measurement '[]'$  - 0 Simple Wound Cleansing - one wound X- 2 5 Complex Wound Cleansing - multiple wounds X- 1 5 Wound Imaging (photographs - any number of wounds) '[]'$  - 0 Wound Tracing (instead of photographs) '[]'$  - 0 Simple Wound Measurement - one wound X- 2 5 Complex Wound Measurement - multiple wounds INTERVENTIONS - Wound Dressings X - Small Wound Dressing one or multiple wounds 2 10 '[]'$  - 0 Medium Wound Dressing one or multiple wounds '[]'$  - 0 Large Wound Dressing one or multiple wounds '[]'$  - 0 Application of Medications - topical '[]'$  - 0 Application of Medications - injection INTERVENTIONS - Miscellaneous '[]'$  - 0 External ear exam '[]'$  - 0 Specimen Collection (cultures, biopsies, blood, body fluids, etc.) '[]'$  - 0 Specimen(s) / Culture(s) sent or taken to Lab for analysis '[]'$  - 0 Patient Transfer (multiple staff / Harrel Lemon Lift / Similar devices) '[]'$  - 0 Simple Staple / Suture removal (25 or less) ANSEL, FERRALL ([id number]) [id number] Page 3 of 11 '[]'$  - 0 Complex Staple / Suture removal (26 or more) '[]'$  - 0 Hypo / Hyperglycemic Management (close monitor of Blood Glucose) '[]'$  - 0 Ankle / Brachial Index (ABI) - do not check if billed separately X- 1 5 Vital Signs Has the patient been seen at the hospital within the last three years: Yes Total Score: 125 Level Of Care: New/Established - Level 4 Electronic Signature(s) Signed: 08/11/2022 5:12:18 PM By: Baruch Gouty RN, BSN Entered By: Baruch Gouty on 08/11/2022 09:20:01 -------------------------------------------------------------------------------- Encounter Discharge Information  Details Patient Name: Date of Service: Jacob [name]. 08/11/2022 8:15 A M Medical Record Number: [id number] Patient Account Number: [id number] Date of Birth/Sex: Treating RN: 1951/10/13 (71 y.o. [name] Primary Care Briany Aye: Alain Honey Other Clinician: Referring Amity Roes: Treating Josephine Wooldridge/Extender: Antonietta Breach in Treatment: 23 Encounter Discharge Information Items Discharge Condition: Stable Ambulatory Status: Cane Discharge Destination: Home Transportation: Other Accompanied By: self Schedule Follow-up Appointment: Yes Clinical Summary of Care: Patient Declined Notes SCAT Electronic Signature(s) Signed: 08/11/2022 5:12:18 PM By: Baruch Gouty RN, BSN Entered By: Baruch Gouty on 08/11/2022 09:20:47 -------------------------------------------------------------------------------- Lower Extremity Assessment Details Patient Name: Date of Service: Jacob [name]. 08/11/2022 8:15 A M Medical Record Number: [id number] Patient Account Number: [id number] Date of Birth/Sex: Treating RN: 1951/03/10 (71 y.o. [name] Primary Care Evangelia Whitaker: Alain Honey Other Clinician: Referring Ashely Joshua: Treating Toshio Slusher/Extender: Antonietta Breach in Treatment: 23 Edema Assessment Assessed: [name]: No] Patrice Paradise: No] Edema: [Left: N] [Right: o] Calf Left: Right: Point of Measurement: From Medial Instep 33.4 cm Jacob [name] ([id number]) [id number] Page 4 of 11 Ankle Left: Right: Point of Measurement: From Medial Instep 19.5 cm Vascular Assessment Pulses: Dorsalis Pedis Palpable: [Right:Yes] Electronic Signature(s) Signed: 08/11/2022 5:12:18 PM By: Baruch Gouty RN, BSN Entered By: Baruch Gouty on 08/11/2022 08:45:01 -------------------------------------------------------------------------------- Multi Wound Chart Details Patient Name: Date of Service: Jacob [name].  08/11/2022 8:15 A M Medical Record Number: [id number] Patient Account Number: [id number] Date of Birth/Sex: Treating RN: 06/22/1951 (71 y.o. M) Primary Care Baila Rouse: Alain Honey Other Clinician: Referring Valorie Mcgrory: Treating Mariluz Crespo/Extender: Antonietta Breach in Treatment: 23 Vital Signs Height(in): 74 Capillary Blood Glucose(mg/dl): 157 Weight(lbs): 172 Pulse(bpm): 46 Body Mass Index(BMI): 22.1 Blood Pressure(mmHg): 120/65 Temperature(F): 97.9 Respiratory Rate(breaths/min): 18 [8:Photos:] [N/A:N/A] Right, Plantar Foot Right, Dorsal Foot N/A Wound Location: Blister Surgical Injury N/A  Wounding Event: Diabetic Wound/Ulcer of the Lower Open Surgical Wound N/A Primary Etiology: Extremity N/A Diabetic Wound/Ulcer of the Lower N/A Secondary Etiology: Extremity Cataracts, Glaucoma, Anemia, Chronic Cataracts, Glaucoma, Anemia, Chronic N/A Comorbid History: Obstructive Pulmonary Disease Obstructive Pulmonary Disease (COPD), Congestive Heart Failure, (COPD), Congestive Heart Failure, Coronary Artery Disease, Coronary Artery Disease, Hypertension, Myocardial Infarction, Hypertension, Myocardial Infarction, Hepatitis C, Type II Diabetes, Gout, Hepatitis C, Type II Diabetes, Gout, Osteoarthritis, Neuropathy Osteoarthritis, Neuropathy 03/08/2022 07/21/2022 N/A Date Acquired: 22 2 N/A Weeks of Treatment: Open Open N/A Wound Status: No No N/A Wound Recurrence: 0.6x0.8x3.3 1x0.1x0.1 N/A Measurements L x W x D (cm) 0.377 0.079 N/A A (cm) : rea 1.244 0.008 N/A Volume (cm) : 65.70% 0.00% N/A % Reduction in Area: -277.00% 50.00% N/A % Reduction in Volume: Grade 2 Full Thickness Without Exposed N/A Classification: Support Structures Medium Medium N/A Exudate Amount: Serosanguineous Serosanguineous N/A Exudate Type: red, brown red, brown N/A Exudate ColorZAK, GONDEK ([id number]) [id number] Page 5 of 11 Thickened  Distinct, outline attached N/A Wound Margin: Large (67-100%) Large (67-100%) N/A Granulation Amount: Red Red N/A Granulation Quality: Small (1-33%) Small (1-33%) N/A Necrotic Amount: Fat Layer (Subcutaneous Tissue): Yes Fat Layer (Subcutaneous Tissue): Yes N/A Exposed Structures: Fascia: No Fascia: No Tendon: No Tendon: No Muscle: No Muscle: No Joint: No Joint: No Bone: No Bone: No Small (1-33%) Medium (34-66%) N/A Epithelialization: Callus: Yes No Abnormalities Noted N/A Periwound Skin Texture: No Abnormalities Noted Dry/Scaly: Yes N/A Periwound Skin Moisture: Maceration: No No Abnormalities Noted No Abnormalities Noted N/A Periwound Skin Color: No Abnormality No Abnormality N/A Temperature: Yes Yes N/A Tenderness on Palpation: Treatment Notes Wound #8 (Foot) Wound Laterality: Plantar, Right Cleanser Soap and Water Discharge Instruction: May shower and wash wound with dial antibacterial soap and water prior to dressing change. Wound Cleanser Discharge Instruction: Cleanse the wound with wound cleanser prior to applying a clean dressing using gauze sponges, not tissue or cotton balls. Peri-Wound Care Topical Primary Dressing Iodoform packing strip 1/2 (in) Discharge Instruction: Lightly pack as instructed Secondary Dressing Optifoam Non-Adhesive Dressing, 4x4 in Discharge Instruction: Apply over primary dressing cut to make foam donut Woven Gauze Sponges 2x2 in Discharge Instruction: Apply over primary dressing as directed. Secured With Elastic Bandage 4 inch (ACE bandage) Discharge Instruction: Secure with ACE bandage as directed. 59M Medipore H Soft Cloth Surgical T ape, 4 x 10 (in/yd) Discharge Instruction: Secure with tape as directed. Compression Wrap Compression Stockings Add-Ons Wound #9 (Foot) Wound Laterality: Dorsal, Right Cleanser Peri-Wound Care Topical Primary Dressing Gauze Non-Woven Sterile Sponge 4x4 (in/in) Secondary Dressing Secured  With Kerlix Roll Sterile, 4.5x3.1 (in/yd) Discharge Instruction: Secure with Kerlix as directed. Compression Wrap Compression Stockings Add-Ons Electronic Signature(s) JIMIE, KUWAHARA ([id number]) [id number] Page 6 of 11 Signed: 08/11/2022 4:29:29 PM By: Linton Ham MD Entered By: Linton Ham on 08/11/2022 09:34:10 -------------------------------------------------------------------------------- Multi-Disciplinary Care Plan Details Patient Name: Date of Service: Jacob [name]. 08/11/2022 8:15 A M Medical Record Number: [id number] Patient Account Number: [id number] Date of Birth/Sex: Treating RN: 1951/09/06 (71 y.o. [name] Primary Care Cinzia Devos: Alain Honey Other Clinician: Referring Alinda Egolf: Treating Trinette Vera/Extender: Antonietta Breach in Treatment: [place] reviewed with physician Active Inactive Nutrition Nursing Diagnoses: Impaired glucose control: actual or potential Potential for alteratiion in Nutrition/Potential for imbalanced nutrition Goals: Patient/caregiver will maintain therapeutic glucose control Date Initiated: 05/10/2022 Target Resolution Date: 09/08/2022 Goal Status: Active Interventions: Assess HgA1c results as ordered upon admission and as needed Provide education  on elevated blood sugars and impact on wound healing Treatment Activities: Dietary management education, guidance and counseling : 05/10/2022 Patient referred to Primary Care Physician for further nutritional evaluation : 05/10/2022 Notes: Wound/Skin Impairment Nursing Diagnoses: Impaired tissue integrity Goals: Patient/caregiver will verbalize understanding of skin care regimen Date Initiated: 03/01/2022 Target Resolution Date: 09/08/2022 Goal Status: Active Interventions: Assess ulceration(s) every visit Treatment Activities: Skin care regimen initiated : 03/01/2022 Notes: Electronic  Signature(s) Signed: 08/11/2022 5:12:18 PM By: Baruch Gouty RN, BSN Entered By: Baruch Gouty on 08/11/2022 08:49:49 Jacob [name] ([id number]) [id number] Page 7 of 11 -------------------------------------------------------------------------------- Pain Assessment Details Patient Name: Date of Service: Jacob [name], Jacob [name] 08/11/2022 8:15 A M Medical Record Number: [id number] Patient Account Number: [id number] Date of Birth/Sex: Treating RN: 04-May-1951 (71 y.o. [name] Primary Care Lashunda Greis: Alain Honey Other Clinician: Referring Haydyn Liddell: Treating Braeden Kennan/Extender: Antonietta Breach in Treatment: 23 Active Problems Location of Pain Severity and Description of Pain Patient Has Paino No Site Locations Rate the pain. Current Pain Level: 0 Pain Management and Medication Current Pain Management: Electronic Signature(s) Signed: 08/11/2022 5:12:18 PM By: Baruch Gouty RN, BSN Entered By: Baruch Gouty on 08/11/2022 08:41:56 -------------------------------------------------------------------------------- Patient/Caregiver Education Details Patient Name: Date of Service: Jacob [name] 10/19/2023andnbsp8:15 A M Medical Record Number: [id number] Patient Account Number: [id number] Date of Birth/Gender: Treating RN: Apr 16, 1951 (71 y.o. [name] Primary Care Physician: Alain Honey Other Clinician: Referring Physician: Treating Physician/Extender: Antonietta Breach in Treatment: 23 Education Assessment Education Provided To: Patient Education Topics Provided Elevated Blood Sugar/ Impact on Healing: Methods: Explain/Verbal Responses: Reinforcements needed, State content correctly Hyperbaric Oxygenation: Handouts: Hyperbaric Oxygen Methods: Explain/Verbal, Printed Responses: State content correctly OffloadingCARRY, ORTEZ ([id number])  [id number] Page 8 of 11 Methods: Explain/Verbal Responses: Reinforcements needed, State content correctly Wound/Skin Impairment: Methods: Explain/Verbal Responses: Reinforcements needed, State content correctly Electronic Signature(s) Signed: 08/11/2022 5:12:18 PM By: Baruch Gouty RN, BSN Entered By: Baruch Gouty on 08/11/2022 09:17:05 -------------------------------------------------------------------------------- Wound Assessment Details Patient Name: Date of Service: Jacob [name]. 08/11/2022 8:15 A M Medical Record Number: [id number] Patient Account Number: [id number] Date of Birth/Sex: Treating RN: 1951-03-27 (71 y.o. [name] Primary Care Elhadji Pecore: Alain Honey Other Clinician: Referring Pihu Basil: Treating Ayleen Mckinstry/Extender: Antonietta Breach in Treatment: 23 Wound Status Wound Number: 8 Primary Diabetic Wound/Ulcer of the Lower Extremity Etiology: Wound Location: Right, Plantar Foot Wound Open Wounding Event: Blister Status: Date Acquired: 03/08/2022 Comorbid Cataracts, Glaucoma, Anemia, Chronic Obstructive Pulmonary Weeks Of Treatment: 22 History: Disease (COPD), Congestive Heart Failure, Coronary Artery Clustered Wound: No Disease, Hypertension, Myocardial Infarction, Hepatitis C, Type II Diabetes, Gout, Osteoarthritis, Neuropathy Photos Wound Measurements Length: (cm) 0.6 Width: (cm) 0.8 Depth: (cm) 3.3 Area: (cm) 0.377 Volume: (cm) 1.244 % Reduction in Area: 65.7% % Reduction in Volume: -277% Epithelialization: Small (1-33%) Tunneling: No Undermining: No Wound Description Classification: Grade 2 Wound Margin: Thickened Exudate Amount: Medium Exudate Type: Serosanguineous Exudate Color: red, brown Foul Odor After Cleansing: No Slough/Fibrino Yes Wound Bed Granulation Amount: Large (67-100%) Exposed Structure Granulation Quality: Red Fascia Exposed: No Necrotic Amount: Small  (1-33%) Fat Layer (Subcutaneous Tissue) Exposed: Yes Necrotic Quality: Adherent Slough Tendon Exposed: No Muscle Exposed: No Joint Exposed: No VIRGIL, LIGHTNER ([id number]) [id number] Page 9 of 11 Bone Exposed: No Periwound Skin Texture Texture Color No Abnormalities Noted: No No Abnormalities Noted: Yes Callus: Yes Temperature / Pain Temperature: No Abnormality Moisture No Abnormalities Noted: Yes Tenderness on Palpation:  Yes Treatment Notes Wound #8 (Foot) Wound Laterality: Plantar, Right Cleanser Soap and Water Discharge Instruction: May shower and wash wound with dial antibacterial soap and water prior to dressing change. Wound Cleanser Discharge Instruction: Cleanse the wound with wound cleanser prior to applying a clean dressing using gauze sponges, not tissue or cotton balls. Peri-Wound Care Topical Primary Dressing Iodoform packing strip 1/2 (in) Discharge Instruction: Lightly pack as instructed Secondary Dressing Optifoam Non-Adhesive Dressing, 4x4 in Discharge Instruction: Apply over primary dressing cut to make foam donut Woven Gauze Sponges 2x2 in Discharge Instruction: Apply over primary dressing as directed. Secured With Elastic Bandage 4 inch (ACE bandage) Discharge Instruction: Secure with ACE bandage as directed. 63M Medipore H Soft Cloth Surgical T ape, 4 x 10 (in/yd) Discharge Instruction: Secure with tape as directed. Compression Wrap Compression Stockings Add-Ons Electronic Signature(s) Signed: 08/11/2022 5:12:18 PM By: Baruch Gouty RN, BSN Entered By: Baruch Gouty on 08/11/2022 08:54:28 -------------------------------------------------------------------------------- Wound Assessment Details Patient Name: Date of Service: Jacob [name]. 08/11/2022 8:15 A M Medical Record Number: [id number] Patient Account Number: [id number] Date of Birth/Sex: Treating RN: [birth year] (71 y.o. [name] Primary Care  Ravenne Wayment: Alain Honey Other Clinician: Referring Lamarion Mcevers: Treating Kennedy Bohanon/Extender: Antonietta Breach in Treatment: 23 Wound Status Wound Number: 9 Primary Open Surgical Wound Etiology: Wound Location: Right, Dorsal Foot Secondary Diabetic Wound/Ulcer of the Lower Extremity Wounding Event: Surgical Injury Etiology: Date Acquired: 07/21/2022 Wound Open Weeks Of Treatment: 2 Status: Clustered Wound: No Comorbid Cataracts, Glaucoma, Anemia, Chronic Obstructive Pulmonary History: Disease (COPD), Congestive Heart Failure, Coronary Artery RADEN, BYINGTON ([id number]) [id number] Page 10 of 11 Disease, Hypertension, Myocardial Infarction, Hepatitis C, Type II Diabetes, Gout, Osteoarthritis, Neuropathy Photos Wound Measurements Length: (cm) Width: (cm) Depth: (cm) Area: (cm) Volume: (cm) 1 % Reduction in Area: 0% 0.1 % Reduction in Volume: 50% 0.1 Epithelialization: Medium (34-66%) 0.079 Tunneling: No 0.008 Undermining: No Wound Description Classification: Full Thickness Without Exposed Suppor Wound Margin: Distinct, outline attached Exudate Amount: Medium Exudate Type: Serosanguineous Exudate Color: red, brown t Structures Foul Odor After Cleansing: No Slough/Fibrino No Wound Bed Granulation Amount: Large (67-100%) Exposed Structure Granulation Quality: Red Fascia Exposed: No Necrotic Amount: Small (1-33%) Fat Layer (Subcutaneous Tissue) Exposed: Yes Necrotic Quality: Adherent Slough Tendon Exposed: No Muscle Exposed: No Joint Exposed: No Bone Exposed: No Periwound Skin Texture Texture Color No Abnormalities Noted: Yes No Abnormalities Noted: Yes Moisture Temperature / Pain No Abnormalities Noted: No Temperature: No Abnormality Dry / Scaly: Yes Tenderness on Palpation: Yes Maceration: No Treatment Notes Wound #9 (Foot) Wound Laterality: Dorsal, Right Cleanser Peri-Wound Care Topical Primary  Dressing Gauze Non-Woven Sterile Sponge 4x4 (in/in) Secondary Dressing Secured With Kerlix Roll Sterile, 4.5x3.1 (in/yd) Discharge Instruction: Secure with Kerlix as directed. Compression Wrap Compression Stockings Add-Ons Electronic Signature(s) Signed: 08/11/2022 5:12:18 PM By: Baruch Gouty RN, BSN Jacob [name] ([id number]) [id number] Page 11 of 11 Entered By: Baruch Gouty on 08/11/2022 08:55:01 -------------------------------------------------------------------------------- Vitals Details Patient Name: Date of Service: Jacob [name], Jacob [name] 08/11/2022 8:15 A M Medical Record Number: [id number] Patient Account Number: [id number] Date of Birth/Sex: Treating RN: [birth year] (71 y.o. [name] Primary Care Dudley Mages: Alain Honey Other Clinician: Referring Zackery Brine: Treating Erienne Spelman/Extender: Antonietta Breach in Treatment: 23 Vital Signs Time Taken: 08:40 Temperature (F): 97.9 Height (in): 74 Pulse (bpm): 82 Weight (lbs): 172 Respiratory Rate (breaths/min): 18 Body Mass Index (BMI): 22.1 Blood Pressure (mmHg): 120/65 Capillary Blood Glucose (mg/dl): 157 Reference Range: 80 -  120 mg / dl Notes glucose per pt report this am Electronic Signature(s) Signed: 08/11/2022 5:12:18 PM By: Baruch Gouty RN, BSN Entered By: Baruch Gouty on 08/11/2022 08:41:50

## 2022-08-12 ENCOUNTER — Other Ambulatory Visit: Payer: Self-pay

## 2022-08-12 ENCOUNTER — Telehealth: Payer: Self-pay

## 2022-08-12 DIAGNOSIS — E1122 Type 2 diabetes mellitus with diabetic chronic kidney disease: Secondary | ICD-10-CM | POA: Diagnosis not present

## 2022-08-12 DIAGNOSIS — M868X8 Other osteomyelitis, other site: Secondary | ICD-10-CM | POA: Diagnosis not present

## 2022-08-12 DIAGNOSIS — D631 Anemia in chronic kidney disease: Secondary | ICD-10-CM | POA: Diagnosis not present

## 2022-08-12 DIAGNOSIS — E11621 Type 2 diabetes mellitus with foot ulcer: Secondary | ICD-10-CM | POA: Diagnosis not present

## 2022-08-12 DIAGNOSIS — E782 Mixed hyperlipidemia: Secondary | ICD-10-CM | POA: Diagnosis not present

## 2022-08-12 DIAGNOSIS — N186 End stage renal disease: Secondary | ICD-10-CM | POA: Diagnosis not present

## 2022-08-12 DIAGNOSIS — B9561 Methicillin susceptible Staphylococcus aureus infection as the cause of diseases classified elsewhere: Secondary | ICD-10-CM | POA: Diagnosis not present

## 2022-08-12 DIAGNOSIS — Z992 Dependence on renal dialysis: Secondary | ICD-10-CM | POA: Diagnosis not present

## 2022-08-12 DIAGNOSIS — N2581 Secondary hyperparathyroidism of renal origin: Secondary | ICD-10-CM | POA: Diagnosis not present

## 2022-08-12 DIAGNOSIS — E1169 Type 2 diabetes mellitus with other specified complication: Secondary | ICD-10-CM | POA: Diagnosis not present

## 2022-08-12 DIAGNOSIS — M86671 Other chronic osteomyelitis, right ankle and foot: Secondary | ICD-10-CM | POA: Diagnosis not present

## 2022-08-12 DIAGNOSIS — D649 Anemia, unspecified: Secondary | ICD-10-CM | POA: Diagnosis not present

## 2022-08-12 DIAGNOSIS — L97516 Non-pressure chronic ulcer of other part of right foot with bone involvement without evidence of necrosis: Secondary | ICD-10-CM | POA: Diagnosis not present

## 2022-08-12 MED ORDER — ONETOUCH ULTRA VI STRP: ORAL_STRIP | 3 refills | Status: DC

## 2022-08-12 NOTE — Telephone Encounter (Signed)
Patient called requesting refill

## 2022-08-12 NOTE — Telephone Encounter (Signed)
-----   Message from Roetta Sessions, [place] sent at 08/12/2022 12:55 PM EDT ----- Regarding: FW: Does pt need Office visit? w/ Dr. Henrene Pastor or you? 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  He has a refill request for Sandostatin.  But hasnt been seen since 2022. Can you ask Henrene Pastor about the refill and he might want him to schedule an appointment. ??  Thanks, Jan   ----- Message ----- From: Yetta Flock, MD Sent: 08/11/2022   6:03 PM EDT To: Roetta Sessions, CMA Subject: RE: Does pt need Office visit? w/ Dr. Henrene Pastor #  This is Dr. Blanch Media patient, I saw him once due to a scheduling error.  Can you send to Magda Paganini so she can discuss with him? Thanks  ----- Message ----- From: Roetta Sessions, CMA Sent: 08/11/2022   3:07 PM EDT To: Yetta Flock, MD Subject: Does pt need Office visit? w/ Dr. Henrene Pastor or y#  Does this patient need to follow with you or Dr. Henrene Pastor? How often should he be seen in the office?  Last seen 04-2021 for:  Gastric and small bowel AVMs Recurrent GI bleeding Anemia End-stage renal disease on HD  Requesting refill of octreotide 10 mg IM every 28 days.  Again, you or Dr. Henrene Pastor?   Thanks! Jan

## 2022-08-12 NOTE — Telephone Encounter (Signed)
After speaking with Jacob [name] about this she told me to get him scheduled for an office visit.  Fyi, patient will probably need this medication before he is able to get an office visit.  Asked one of the schedulers to call him - she reported back to me that he told her he has so many doctors appointments he does not want to schedule one with [place] and wants to know if we can refill this without him having to come in.  Please advise.

## 2022-08-14 ENCOUNTER — Other Ambulatory Visit: Payer: Self-pay | Admitting: Nurse Practitioner

## 2022-08-14 NOTE — Telephone Encounter (Signed)
Refill for one year. Does not need to come in Thanks

## 2022-08-15 ENCOUNTER — Telehealth: Payer: Self-pay

## 2022-08-15 ENCOUNTER — Other Ambulatory Visit (HOSPITAL_COMMUNITY): Payer: Self-pay

## 2022-08-15 ENCOUNTER — Other Ambulatory Visit: Payer: Self-pay

## 2022-08-15 ENCOUNTER — Other Ambulatory Visit: Payer: Self-pay | Admitting: Gastroenterology

## 2022-08-15 DIAGNOSIS — Z992 Dependence on renal dialysis: Secondary | ICD-10-CM | POA: Diagnosis not present

## 2022-08-15 DIAGNOSIS — E782 Mixed hyperlipidemia: Secondary | ICD-10-CM | POA: Diagnosis not present

## 2022-08-15 DIAGNOSIS — M86671 Other chronic osteomyelitis, right ankle and foot: Secondary | ICD-10-CM | POA: Diagnosis not present

## 2022-08-15 DIAGNOSIS — D649 Anemia, unspecified: Secondary | ICD-10-CM | POA: Diagnosis not present

## 2022-08-15 DIAGNOSIS — D631 Anemia in chronic kidney disease: Secondary | ICD-10-CM | POA: Diagnosis not present

## 2022-08-15 DIAGNOSIS — E11621 Type 2 diabetes mellitus with foot ulcer: Secondary | ICD-10-CM | POA: Diagnosis not present

## 2022-08-15 DIAGNOSIS — E1122 Type 2 diabetes mellitus with diabetic chronic kidney disease: Secondary | ICD-10-CM | POA: Diagnosis not present

## 2022-08-15 DIAGNOSIS — N2581 Secondary hyperparathyroidism of renal origin: Secondary | ICD-10-CM | POA: Diagnosis not present

## 2022-08-15 DIAGNOSIS — E1169 Type 2 diabetes mellitus with other specified complication: Secondary | ICD-10-CM | POA: Diagnosis not present

## 2022-08-15 DIAGNOSIS — M868X8 Other osteomyelitis, other site: Secondary | ICD-10-CM | POA: Diagnosis not present

## 2022-08-15 DIAGNOSIS — L97516 Non-pressure chronic ulcer of other part of right foot with bone involvement without evidence of necrosis: Secondary | ICD-10-CM | POA: Diagnosis not present

## 2022-08-15 DIAGNOSIS — N186 End stage renal disease: Secondary | ICD-10-CM | POA: Diagnosis not present

## 2022-08-15 DIAGNOSIS — B9561 Methicillin susceptible Staphylococcus aureus infection as the cause of diseases classified elsewhere: Secondary | ICD-10-CM | POA: Diagnosis not present

## 2022-08-15 MED ORDER — SANDOSTATIN LAR DEPOT 10 MG IM KIT: 10.0000 mg | PACK | INTRAMUSCULAR | 11 refills | Status: DC

## 2022-08-15 MED ORDER — OXYCODONE-ACETAMINOPHEN 5-325 MG PO TABS: 1.0000 | ORAL_TABLET | Freq: Four times a day (QID) | ORAL | 0 refills | Status: DC | PRN

## 2022-08-15 NOTE — Patient Outreach (Signed)
  Care Coordination   08/15/2022 Name: DEMICHAEL TRAUM MRN: [id number] DOB: November 09, 1950   Care Coordination Outreach Attempts:  Voicemail message received from patient requesting return call. An unsuccessful outreach attempt was made.  Follow Up Plan:  Patient already has scheduled appt with assigned RN CM.   Encounter Outcome:  No Answer  Care Coordination Interventions Activated:  No   Care Coordination Interventions:  No, not indicated    Enzo Montgomery, RN,BSN,CCM [facility] Telephonic Care Management Coordinator Direct Phone: [phone] Toll Free: [phone] Fax: [phone]

## 2022-08-15 NOTE — Telephone Encounter (Signed)
Patient request refill on oxycodone. Patient needs to update opioid contract. Medication last refilled 08/04/2022.  Medication pended and sent to Dr. Alain Honey for approval.

## 2022-08-15 NOTE — Telephone Encounter (Signed)
Refilled Ocetotride per Dr. Henrene Pastor

## 2022-08-15 NOTE — Patient Outreach (Signed)
   Care Coordination Follow Up  08/15/2022 Name:  Jacob [name] MRN:  [id number] DOB:  22-Jun-1951  Subjective: Jacob [name] is a 71 y.o. year old male who is a primary care patient of Sabra Heck Lillette Boxer, MD  Second voicemail received from patient returning RN CM call. Spoke with patient. He voices that med refill has been resolved. He is going tomorrow to pick up refills on meds. He has PCP appt next ina  few wks and plans to voice is concerns to MD regarding the hassle and multiple calls it takes to get his meds refilled in timely manner. No other RN CM needs or concerns at this time.    Care Coordination Interventions Activated:  Yes  Care Coordination Interventions:  Yes, provided   Follow up plan: No further intervention required. Patient already has follow up appt with assigned RN CM.  Encounter Outcome:  Pt. Visit Completed   Enzo Montgomery, RN,BSN,CCM [facility] Telephonic Care Management Coordinator Direct Phone: [phone] Toll Free: [phone] Fax: [phone]

## 2022-08-17 DIAGNOSIS — E782 Mixed hyperlipidemia: Secondary | ICD-10-CM | POA: Diagnosis not present

## 2022-08-17 DIAGNOSIS — N186 End stage renal disease: Secondary | ICD-10-CM | POA: Diagnosis not present

## 2022-08-17 DIAGNOSIS — B9561 Methicillin susceptible Staphylococcus aureus infection as the cause of diseases classified elsewhere: Secondary | ICD-10-CM | POA: Diagnosis not present

## 2022-08-17 DIAGNOSIS — E1169 Type 2 diabetes mellitus with other specified complication: Secondary | ICD-10-CM | POA: Diagnosis not present

## 2022-08-17 DIAGNOSIS — D649 Anemia, unspecified: Secondary | ICD-10-CM | POA: Diagnosis not present

## 2022-08-17 DIAGNOSIS — D631 Anemia in chronic kidney disease: Secondary | ICD-10-CM | POA: Diagnosis not present

## 2022-08-17 DIAGNOSIS — E1122 Type 2 diabetes mellitus with diabetic chronic kidney disease: Secondary | ICD-10-CM | POA: Diagnosis not present

## 2022-08-17 DIAGNOSIS — M86671 Other chronic osteomyelitis, right ankle and foot: Secondary | ICD-10-CM | POA: Diagnosis not present

## 2022-08-17 DIAGNOSIS — M868X8 Other osteomyelitis, other site: Secondary | ICD-10-CM | POA: Diagnosis not present

## 2022-08-17 DIAGNOSIS — L97516 Non-pressure chronic ulcer of other part of right foot with bone involvement without evidence of necrosis: Secondary | ICD-10-CM | POA: Diagnosis not present

## 2022-08-17 DIAGNOSIS — Z992 Dependence on renal dialysis: Secondary | ICD-10-CM | POA: Diagnosis not present

## 2022-08-17 DIAGNOSIS — N2581 Secondary hyperparathyroidism of renal origin: Secondary | ICD-10-CM | POA: Diagnosis not present

## 2022-08-17 DIAGNOSIS — E11621 Type 2 diabetes mellitus with foot ulcer: Secondary | ICD-10-CM | POA: Diagnosis not present

## 2022-08-18 ENCOUNTER — Other Ambulatory Visit (HOSPITAL_COMMUNITY): Payer: Self-pay

## 2022-08-18 ENCOUNTER — Other Ambulatory Visit: Payer: Self-pay

## 2022-08-18 ENCOUNTER — Ambulatory Visit (HOSPITAL_BASED_OUTPATIENT_CLINIC_OR_DEPARTMENT_OTHER): Payer: Medicare Other | Admitting: General Surgery

## 2022-08-18 ENCOUNTER — Other Ambulatory Visit: Payer: Self-pay | Admitting: Gastroenterology

## 2022-08-18 ENCOUNTER — Ambulatory Visit (HOSPITAL_COMMUNITY)
Admission: RE | Admit: 2022-08-18 | Discharge: 2022-08-18 | Disposition: A | Discharge status: Home / Self Care | Payer: Medicare Other | Source: Ambulatory Visit | Attending: Gastroenterology | Admitting: Gastroenterology

## 2022-08-18 ENCOUNTER — Ambulatory Visit (INDEPENDENT_AMBULATORY_CARE_PROVIDER_SITE_OTHER): Payer: Medicare Other | Admitting: Podiatry

## 2022-08-18 DIAGNOSIS — E0842 Diabetes mellitus due to underlying condition with diabetic polyneuropathy: Secondary | ICD-10-CM

## 2022-08-18 DIAGNOSIS — M86471 Chronic osteomyelitis with draining sinus, right ankle and foot: Secondary | ICD-10-CM

## 2022-08-18 DIAGNOSIS — Q2730 Arteriovenous malformation, site unspecified: Secondary | ICD-10-CM | POA: Diagnosis not present

## 2022-08-18 DIAGNOSIS — L97514 Non-pressure chronic ulcer of other part of right foot with necrosis of bone: Secondary | ICD-10-CM

## 2022-08-18 MED ORDER — SANDOSTATIN LAR DEPOT 10 MG IM KIT
10.0000 mg | PACK | INTRAMUSCULAR | 11 refills | Status: DC
  Filled 2022-08-18: qty 1, 28d supply, fill #0
  Filled 2022-09-07: qty 1, 28d supply, fill #1
  Filled 2022-09-26 – 2022-10-06 (×3): qty 1, 28d supply, fill #2
  Filled 2022-11-08: qty 1, 28d supply, fill #3
  Filled 2022-11-10: qty 1, 28d supply, fill #4
  Filled 2022-12-28: qty 1, 28d supply, fill #5
  Filled 2023-01-05: qty 1, 28d supply, fill #6
  Filled 2023-02-22: qty 1, 28d supply, fill #7
  Filled 2023-03-22: qty 1, 28d supply, fill #8
  Filled 2023-04-14: qty 1, 28d supply, fill #9
  Filled 2023-05-12: qty 1, 28d supply, fill #10
  Filled 2023-06-09: qty 1, 28d supply, fill #11

## 2022-08-18 MED ORDER — OCTREOTIDE ACETATE 20 MG IM KIT
10.0000 mg | PACK | INTRAMUSCULAR | Status: DC
  Administered 2022-08-18: 10 mg via INTRAMUSCULAR
  Filled 2022-08-18: qty 1

## 2022-08-18 NOTE — Progress Notes (Signed)
  Subjective:  [name], male    DOB: May 17, 1951,  MRN: [id number]  Chief Complaint  Patient presents with   Routine Post Op    POV#2 / DOS 07/20/22 -Irragation and debridement of right foot wound with extraction of all necrotic soft tissue and bone    DOS: July 20, 2022 Procedure: Right foot resection of fourth metatarsal head and base of proximal phalanx of the fourth toe, irrigation and debridement of plantar soft tissue ulceration  71 y.o. male returns for post-op check.  He has been continuing with iodoform packing to the plantar right foot wound per wound care center as well as home care nurse.  Additionally he states that the dorsal incision with the sutures has been stable with no issues.  He feels that the wound is improving.  He denies any nausea vomiting fever chills.  Says his pain has been under control.  Review of Systems: Negative except as noted in the HPI. Denies N/V/F/Ch.   Objective:  There were no vitals filed for this visit. There is no height or weight on file to calculate BMI. Constitutional Well developed. Well nourished.  Vascular Foot warm and well perfused. Capillary refill normal to all digits.  Calf is soft and supple, no posterior calf or knee pain, negative Homans' sign  Neurologic Normal speech. Oriented to person, place, and time. Epicritic sensation to light touch grossly present bilaterally.  Dermatologic Skin healing well without signs of infection. Skin edges well coapted without signs of infection. Dorsal foot incision is now nearly fully healed with minimal eschar. Plantar 4th MPJ decreasing in depth, no active drainage, erythema or malodor.      Orthopedic: Tenderness to palpation noted about the surgical site.   Multiple view plain film radiographs: Deferred at this visit Assessment:   1. Chronic osteomyelitis of right foot with draining sinus (HCC)   2. Ulcer of right foot, with necrosis of bone ([place])   3. Diabetes  mellitus due to underlying condition with diabetic polyneuropathy, unspecified whether long term insulin use ([place])     Plan:  Patient was evaluated and treated and all questions answered.  S/p foot surgery right fourth MPJ resection with closure of the dorsal incision and irrigation debridement of plantar right foot wound currently being packed with iodoform gauze. -Progressing as expected post-operatively.  No evidence of significant distal dehiscence or complication at the dorsal incision which is now healed.  Plantar wound appears to be improving with decreased depth from prior -XR: Deferred at this visit  -WB Status: Weightbearing as tolerated in postop shoe -Sutures: Removed from dorsal incision at this visit -Medications: continue antibiotics during dialysis sessions which is currently the plan per infectious disease -Foot redressed.  Applied Betadine wet to dry gauze.  Applied iodoform packing strip to the plantar ulceration. -Patient will continue to follow-up with wound care center for at least once weekly dressing changes.    Return in about 2 weeks (around 09/01/2022) for 3rd POV R 4th MPJ resection.         Everitt Amber, DPM [facility]

## 2022-08-19 ENCOUNTER — Encounter: Payer: Self-pay | Admitting: Internal Medicine

## 2022-08-19 ENCOUNTER — Telehealth: Payer: Self-pay | Admitting: Podiatry

## 2022-08-19 ENCOUNTER — Other Ambulatory Visit (HOSPITAL_COMMUNITY): Payer: Self-pay

## 2022-08-19 DIAGNOSIS — N2581 Secondary hyperparathyroidism of renal origin: Secondary | ICD-10-CM | POA: Diagnosis not present

## 2022-08-19 DIAGNOSIS — N186 End stage renal disease: Secondary | ICD-10-CM | POA: Diagnosis not present

## 2022-08-19 DIAGNOSIS — E1122 Type 2 diabetes mellitus with diabetic chronic kidney disease: Secondary | ICD-10-CM | POA: Diagnosis not present

## 2022-08-19 DIAGNOSIS — D649 Anemia, unspecified: Secondary | ICD-10-CM | POA: Diagnosis not present

## 2022-08-19 DIAGNOSIS — M868X8 Other osteomyelitis, other site: Secondary | ICD-10-CM | POA: Diagnosis not present

## 2022-08-19 DIAGNOSIS — D631 Anemia in chronic kidney disease: Secondary | ICD-10-CM | POA: Diagnosis not present

## 2022-08-19 DIAGNOSIS — Z992 Dependence on renal dialysis: Secondary | ICD-10-CM | POA: Diagnosis not present

## 2022-08-19 NOTE — Telephone Encounter (Signed)
Patient called and wanted to know if Dr. Loel Lofty is making sure his dialysis center is getting his antibiotics. Patient is in Dialysis MWF. He just wanted to make sure its getting distributed on time   Please advise

## 2022-08-20 DIAGNOSIS — E11621 Type 2 diabetes mellitus with foot ulcer: Secondary | ICD-10-CM | POA: Diagnosis not present

## 2022-08-20 DIAGNOSIS — B9561 Methicillin susceptible Staphylococcus aureus infection as the cause of diseases classified elsewhere: Secondary | ICD-10-CM | POA: Diagnosis not present

## 2022-08-20 DIAGNOSIS — E782 Mixed hyperlipidemia: Secondary | ICD-10-CM | POA: Diagnosis not present

## 2022-08-20 DIAGNOSIS — L97516 Non-pressure chronic ulcer of other part of right foot with bone involvement without evidence of necrosis: Secondary | ICD-10-CM | POA: Diagnosis not present

## 2022-08-20 DIAGNOSIS — E1169 Type 2 diabetes mellitus with other specified complication: Secondary | ICD-10-CM | POA: Diagnosis not present

## 2022-08-20 DIAGNOSIS — M86671 Other chronic osteomyelitis, right ankle and foot: Secondary | ICD-10-CM | POA: Diagnosis not present

## 2022-08-22 ENCOUNTER — Other Ambulatory Visit: Payer: Self-pay | Admitting: *Deleted

## 2022-08-22 DIAGNOSIS — M86671 Other chronic osteomyelitis, right ankle and foot: Secondary | ICD-10-CM | POA: Diagnosis not present

## 2022-08-22 DIAGNOSIS — E782 Mixed hyperlipidemia: Secondary | ICD-10-CM | POA: Diagnosis not present

## 2022-08-22 DIAGNOSIS — D649 Anemia, unspecified: Secondary | ICD-10-CM | POA: Diagnosis not present

## 2022-08-22 DIAGNOSIS — D631 Anemia in chronic kidney disease: Secondary | ICD-10-CM | POA: Diagnosis not present

## 2022-08-22 DIAGNOSIS — N2581 Secondary hyperparathyroidism of renal origin: Secondary | ICD-10-CM | POA: Diagnosis not present

## 2022-08-22 DIAGNOSIS — L97516 Non-pressure chronic ulcer of other part of right foot with bone involvement without evidence of necrosis: Secondary | ICD-10-CM | POA: Diagnosis not present

## 2022-08-22 DIAGNOSIS — M868X8 Other osteomyelitis, other site: Secondary | ICD-10-CM | POA: Diagnosis not present

## 2022-08-22 DIAGNOSIS — Z992 Dependence on renal dialysis: Secondary | ICD-10-CM | POA: Diagnosis not present

## 2022-08-22 DIAGNOSIS — N186 End stage renal disease: Secondary | ICD-10-CM | POA: Diagnosis not present

## 2022-08-22 DIAGNOSIS — E1169 Type 2 diabetes mellitus with other specified complication: Secondary | ICD-10-CM | POA: Diagnosis not present

## 2022-08-22 DIAGNOSIS — E1122 Type 2 diabetes mellitus with diabetic chronic kidney disease: Secondary | ICD-10-CM | POA: Diagnosis not present

## 2022-08-22 DIAGNOSIS — B9561 Methicillin susceptible Staphylococcus aureus infection as the cause of diseases classified elsewhere: Secondary | ICD-10-CM | POA: Diagnosis not present

## 2022-08-22 DIAGNOSIS — E11621 Type 2 diabetes mellitus with foot ulcer: Secondary | ICD-10-CM | POA: Diagnosis not present

## 2022-08-22 MED ORDER — DICLOFENAC SODIUM 1 % EX GEL: CUTANEOUS | 1 refills | Status: DC

## 2022-08-22 NOTE — Telephone Encounter (Signed)
Pharmacy requested refill

## 2022-08-23 ENCOUNTER — Encounter (HOSPITAL_BASED_OUTPATIENT_CLINIC_OR_DEPARTMENT_OTHER): Payer: Medicare Other | Admitting: General Surgery

## 2022-08-23 ENCOUNTER — Ambulatory Visit (HOSPITAL_COMMUNITY): Payer: Medicare Other | Attending: Internal Medicine

## 2022-08-23 ENCOUNTER — Telehealth: Payer: Self-pay | Admitting: Cardiovascular Disease

## 2022-08-23 ENCOUNTER — Telehealth: Payer: Self-pay

## 2022-08-23 DIAGNOSIS — N184 Chronic kidney disease, stage 4 (severe): Secondary | ICD-10-CM | POA: Insufficient documentation

## 2022-08-23 DIAGNOSIS — B20 Human immunodeficiency virus [HIV] disease: Secondary | ICD-10-CM | POA: Insufficient documentation

## 2022-08-23 DIAGNOSIS — L97512 Non-pressure chronic ulcer of other part of right foot with fat layer exposed: Secondary | ICD-10-CM | POA: Diagnosis not present

## 2022-08-23 DIAGNOSIS — M1990 Unspecified osteoarthritis, unspecified site: Secondary | ICD-10-CM | POA: Diagnosis not present

## 2022-08-23 DIAGNOSIS — Z79899 Other long term (current) drug therapy: Secondary | ICD-10-CM | POA: Diagnosis not present

## 2022-08-23 DIAGNOSIS — I129 Hypertensive chronic kidney disease with stage 1 through stage 4 chronic kidney disease, or unspecified chronic kidney disease: Secondary | ICD-10-CM | POA: Diagnosis not present

## 2022-08-23 DIAGNOSIS — E1122 Type 2 diabetes mellitus with diabetic chronic kidney disease: Secondary | ICD-10-CM | POA: Insufficient documentation

## 2022-08-23 DIAGNOSIS — N2581 Secondary hyperparathyroidism of renal origin: Secondary | ICD-10-CM | POA: Diagnosis not present

## 2022-08-23 DIAGNOSIS — E785 Hyperlipidemia, unspecified: Secondary | ICD-10-CM | POA: Insufficient documentation

## 2022-08-23 DIAGNOSIS — E1140 Type 2 diabetes mellitus with diabetic neuropathy, unspecified: Secondary | ICD-10-CM | POA: Diagnosis not present

## 2022-08-23 DIAGNOSIS — E11621 Type 2 diabetes mellitus with foot ulcer: Secondary | ICD-10-CM | POA: Diagnosis not present

## 2022-08-23 DIAGNOSIS — I5022 Chronic systolic (congestive) heart failure: Secondary | ICD-10-CM | POA: Insufficient documentation

## 2022-08-23 DIAGNOSIS — I10 Essential (primary) hypertension: Secondary | ICD-10-CM | POA: Diagnosis not present

## 2022-08-23 DIAGNOSIS — M109 Gout, unspecified: Secondary | ICD-10-CM | POA: Diagnosis not present

## 2022-08-23 DIAGNOSIS — Z794 Long term (current) use of insulin: Secondary | ICD-10-CM | POA: Diagnosis not present

## 2022-08-23 DIAGNOSIS — I132 Hypertensive heart and chronic kidney disease with heart failure and with stage 5 chronic kidney disease, or end stage renal disease: Secondary | ICD-10-CM | POA: Diagnosis not present

## 2022-08-23 DIAGNOSIS — N186 End stage renal disease: Secondary | ICD-10-CM | POA: Diagnosis not present

## 2022-08-23 DIAGNOSIS — I5042 Chronic combined systolic (congestive) and diastolic (congestive) heart failure: Secondary | ICD-10-CM | POA: Diagnosis not present

## 2022-08-23 DIAGNOSIS — M86671 Other chronic osteomyelitis, right ankle and foot: Secondary | ICD-10-CM | POA: Diagnosis not present

## 2022-08-23 DIAGNOSIS — I255 Ischemic cardiomyopathy: Secondary | ICD-10-CM | POA: Diagnosis not present

## 2022-08-23 DIAGNOSIS — I2510 Atherosclerotic heart disease of native coronary artery without angina pectoris: Secondary | ICD-10-CM | POA: Insufficient documentation

## 2022-08-23 DIAGNOSIS — E1151 Type 2 diabetes mellitus with diabetic peripheral angiopathy without gangrene: Secondary | ICD-10-CM | POA: Diagnosis not present

## 2022-08-23 DIAGNOSIS — Z992 Dependence on renal dialysis: Secondary | ICD-10-CM | POA: Diagnosis not present

## 2022-08-23 LAB — ECHOCARDIOGRAM COMPLETE
Area-P 1/2: 5.18 cm2
MV M vel: 3.82 m/s
MV Peak grad: 58.4 mmHg
S' Lateral: 3.9 cm

## 2022-08-23 MED ORDER — PERFLUTREN LIPID MICROSPHERE
1.0000 mL | INTRAVENOUS | Status: AC | PRN
  Administered 2022-08-23: 2 mL via INTRAVENOUS

## 2022-08-23 NOTE — Progress Notes (Signed)
Jacob [name], Jacob [name] ([id number]) [id number] Page 1 of 8 Visit Report for 08/23/2022 Arrival Information Details Patient Name: Date of Service: Jacob [name], Jacob [name] 08/23/2022 8:30 A M Medical Record Number: [id number] Patient Account Number: [id number] Date of Birth/Sex: Treating RN: [birth year] (71 y.o. Jacob [name] Primary Care Kol Consuegra: Jacob [name] Other Clinician: Referring Jacob [name]: Treating Jacob [name]/Extender: Neil Crouch in Treatment: 25 Visit Information History Since Last Visit Added or deleted any medications: No Patient Arrived: Jacob [name] Any new allergies or adverse reactions: No Arrival Time: 08:09 Had a fall or experienced change in No Accompanied By: self activities of daily living that may affect Transfer Assistance: None risk of falls: Patient Identification Verified: Yes Signs or symptoms of abuse/neglect since last visito No Secondary Verification Process Completed: Yes Hospitalized since last visit: No Patient Requires Transmission-Based Precautions: No Implantable device outside of the clinic excluding No Patient Has Alerts: No cellular tissue based products placed in the center since last visit: Has Dressing in Place as Prescribed: Yes Pain Present Now: No Electronic Signature(s) Signed: 08/23/2022 4:24:58 PM By: Adline Peals Entered By: Adline Peals on 08/23/2022 08:11:50 -------------------------------------------------------------------------------- Encounter Discharge Information Details Patient Name: Date of Service: Jacob Ache. 08/23/2022 8:30 A M Medical Record Number: [id number] Patient Account Number: [id number] Date of Birth/Sex: Treating RN: Feb 07, 1951 (71 y.o. Jacob [name] Primary Care Jacob [name]: Jacob [name] Other Clinician: Referring Jacob [name]: Treating Jacob [name]/Extender: Neil Crouch in Treatment: 25 Encounter Discharge  Information Items Post Procedure Vitals Discharge Condition: Stable Temperature (F): 97.7 Ambulatory Status: Cane Pulse (bpm): 81 Discharge Destination: Home Respiratory Rate (breaths/min): 18 Transportation: Private Auto Blood Pressure (mmHg): 131/74 Accompanied By: self Schedule Follow-up Appointment: Yes Clinical Summary of Care: Patient Declined Electronic Signature(s) Signed: 08/23/2022 4:24:58 PM By: Adline Peals Entered By: Adline Peals on 08/23/2022 08:50:17 Jacob [name] ([id number]) [id number] Page 2 of 8 -------------------------------------------------------------------------------- Lower Extremity Assessment Details Patient Name: Date of Service: Jacob [name], Jacob [name] 08/23/2022 8:30 A M Medical Record Number: [id number] Patient Account Number: [id number] Date of Birth/Sex: Treating RN: 1951/08/16 (71 y.o. Jacob [name] Primary Care Kiela Shisler: Jacob [name] Other Clinician: Referring Jacob [name]: Treating Ambrea Hegler/Extender: Neil Crouch in Treatment: 25 Edema Assessment Assessed: [name]: No] Jacob [name]: No] Edema: [Left: N] [Right: o] Calf Left: Right: Point of Measurement: From Medial Instep 33.4 cm Ankle Left: Right: Point of Measurement: From Medial Instep 19.5 cm Electronic Signature(s) Signed: 08/23/2022 4:24:58 PM By: Adline Peals Entered By: Adline Peals on 08/23/2022 08:16:42 -------------------------------------------------------------------------------- Multi Wound Chart Details Patient Name: Date of Service: Jacob Ache. 08/23/2022 8:30 A M Medical Record Number: [id number] Patient Account Number: [id number] Date of Birth/Sex: Treating RN: [birth year] (71 y.o. M) Primary Care Jacob [name]: Jacob [name] Other Clinician: Referring Aara Jacquot: Treating Jacob [name]/Extender: Neil Crouch in Treatment: 25 Vital Signs Height(in): 74 Pulse(bpm):  81 Weight(lbs): 172 Blood Pressure(mmHg): 131/74 Body Mass Index(BMI): 22.1 Temperature(F): 97.7 Respiratory Rate(breaths/min): 18 [8:Photos:] [N/A:N/A] Right, Plantar Foot Right, Dorsal Foot N/A Wound Location: Blister Surgical Injury N/A Wounding Event: Diabetic Wound/Ulcer of the Lower Open Surgical Wound N/A Primary Etiology: Extremity N/A Diabetic Wound/Ulcer of the Lower N/A Secondary Etiology: Extremity Jacob [name], Jacob [name] ([id number]) [id number] Page 3 of 8 Cataracts, Glaucoma, Anemia, Chronic Cataracts, Glaucoma, Anemia, Chronic N/A Comorbid History: Obstructive Pulmonary Disease Obstructive Pulmonary Disease (COPD), Congestive Heart Failure, (COPD), Congestive Heart Failure, Coronary Artery Disease, Coronary Artery Disease, Hypertension, Myocardial Infarction, Hypertension, Myocardial Infarction, Hepatitis C, Type II  Diabetes, Gout, Hepatitis C, Type II Diabetes, Gout, Osteoarthritis, Neuropathy Osteoarthritis, Neuropathy 03/08/2022 07/21/2022 N/A Date Acquired: 24 4 N/A Weeks of Treatment: Open Open N/A Wound Status: No No N/A Wound Recurrence: 0.3x0.6x0.6 0x0x0 N/A Measurements L x W x D (cm) 0.141 0 N/A A (cm) : rea 0.085 0 N/A Volume (cm) : 87.20% 100.00% N/A % Reduction in A rea: 74.20% 100.00% N/A % Reduction in Volume: Grade 2 Full Thickness Without Exposed N/A Classification: Support Structures Medium None Present N/A Exudate A mount: Serosanguineous N/A N/A Exudate Type: red, brown N/A N/A Exudate Color: Thickened Flat and Intact N/A Wound Margin: Large (67-100%) None Present (0%) N/A Granulation A mount: Red N/A N/A Granulation Quality: Small (1-33%) None Present (0%) N/A Necrotic A mount: Fat Layer (Subcutaneous Tissue): Yes Fascia: No N/A Exposed Structures: Fascia: No Fat Layer (Subcutaneous Tissue): No Tendon: No Tendon: No Muscle: No Muscle: No Joint: No Joint: No Bone: No Bone: No Small  (1-33%) Large (67-100%) N/A Epithelialization: Debridement - Selective/Open Wound N/A N/A Debridement: Pre-procedure Verification/Time Out 08:30 N/A N/A Taken: Lidocaine 4% Topical Solution N/A N/A Pain Control: Callus N/A N/A Tissue Debrided: Non-Viable Tissue N/A N/A Level: 0.18 N/A N/A Debridement A (sq cm): rea Curette N/A N/A Instrument: Minimum N/A N/A Bleeding: Pressure N/A N/A Hemostasis A chieved: Procedure was tolerated well N/A N/A Debridement Treatment Response: 0.3x0.6x0.6 N/A N/A Post Debridement Measurements L x W x D (cm) 0.085 N/A N/A Post Debridement Volume: (cm) Callus: Yes No Abnormalities Noted N/A Periwound Skin Texture: No Abnormalities Noted Dry/Scaly: Yes N/A Periwound Skin Moisture: Maceration: No No Abnormalities Noted No Abnormalities Noted N/A Periwound Skin Color: No Abnormality No Abnormality N/A Temperature: Yes Yes N/A Tenderness on Palpation: Debridement N/A N/A Procedures Performed: Treatment Notes Electronic Signature(s) Signed: 08/23/2022 8:47:03 AM By: Fredirick Maudlin MD FACS Entered By: Fredirick Maudlin on 08/23/2022 08:47:02 -------------------------------------------------------------------------------- Multi-Disciplinary Care Plan Details Patient Name: Date of Service: Jacob Ache. 08/23/2022 8:30 A M Medical Record Number: [id number] Patient Account Number: [id number] Date of Birth/Sex: Treating RN: [birth year] (71 y.o. Jacob [name] Primary Care Debralee Braaksma: Jacob [name] Other Clinician: Referring Khalid Lacko: Treating Georgia Delsignore/Extender: Neil Crouch in Treatment: [place] reviewed with physician [name] Jacob [name], Jacob [name] ([id number]) [id number] Page 4 of 8 Nutrition Nursing Diagnoses: Impaired glucose control: actual or potential Potential for alteratiion in Nutrition/Potential for imbalanced  nutrition Goals: Patient/caregiver will maintain therapeutic glucose control Date Initiated: 05/10/2022 Target Resolution Date: 09/08/2022 Goal Status: Active Interventions: Assess HgA1c results as ordered upon admission and as needed Provide education on elevated blood sugars and impact on wound healing Treatment Activities: Dietary management education, guidance and counseling : 05/10/2022 Patient referred to Primary Care Physician for further nutritional evaluation : 05/10/2022 Notes: Wound/Skin Impairment Nursing Diagnoses: Impaired tissue integrity Goals: Patient/caregiver will verbalize understanding of skin care regimen Date Initiated: 03/01/2022 Target Resolution Date: 09/08/2022 Goal Status: Active Interventions: Assess ulceration(s) every visit Treatment Activities: Skin care regimen initiated : 03/01/2022 Notes: Electronic Signature(s) Signed: 08/23/2022 4:24:58 PM By: Adline Peals Entered By: Adline Peals on 08/23/2022 08:32:39 -------------------------------------------------------------------------------- Pain Assessment Details Patient Name: Date of Service: Jacob Ache. 08/23/2022 8:30 A M Medical Record Number: [id number] Patient Account Number: [id number] Date of Birth/Sex: Treating RN: 15-Feb-1951 (71 y.o. Jacob [name] Primary Care Leighann Amadon: Jacob [name] Other Clinician: Referring Latoya Maulding: Treating Vega Withrow/Extender: Neil Crouch in Treatment: 25 Active Problems Location of Pain Severity and Description of Pain Patient Has Paino No  Site Locations Rate the pain. Jacob [name], Jacob [name] ([id number]) [id number] Page 5 of 8 Rate the pain. Current Pain Level: 0 Pain Management and Medication Current Pain Management: Electronic Signature(s) Signed: 08/23/2022 4:24:58 PM By: Adline Peals Entered By: Adline Peals on 08/23/2022  08:13:20 -------------------------------------------------------------------------------- Patient/Caregiver Education Details Patient Name: Date of Service: Jacob Ache 10/31/2023andnbsp8:30 A M Medical Record Number: [id number] Patient Account Number: [id number] Date of Birth/Gender: Treating RN: 30-Dec-1950 (71 y.o. Jacob [name] Primary Care Physician: Jacob [name] Other Clinician: Referring Physician: Treating Physician/Extender: Neil Crouch in Treatment: 25 Education Assessment Education Provided To: Patient Education Topics Provided Wound/Skin Impairment: Methods: Explain/Verbal Responses: Reinforcements needed, State content correctly [employer]) Signed: 08/23/2022 4:24:58 PM By: Adline Peals Entered By: Adline Peals on 08/23/2022 08:32:58 -------------------------------------------------------------------------------- Wound Assessment Details Patient Name: Date of Service: Jacob Ache. 08/23/2022 8:30 A M Medical Record Number: [id number] Patient Account Number: [id number] Date of Birth/Sex: Treating RN: Jun 01, 1951 (71 y.o. Jacob [name] Primary Care Detavious Rinn: Jacob [name] Other Clinician: Referring Nichoals Heyde: Treating Arrianna Catala/Extender: Jacob [name], Jacob [name] ([id number]) [phone].pdf Page 6 of 8 Weeks in Treatment: 25 Wound Status Wound Number: 8 Primary Diabetic Wound/Ulcer of the Lower Extremity Etiology: Wound Location: Right, Plantar Foot Wound Open Wounding Event: Blister Status: Date Acquired: 03/08/2022 Comorbid Cataracts, Glaucoma, Anemia, Chronic Obstructive Pulmonary Weeks Of Treatment: 24 History: Disease (COPD), Congestive Heart Failure, Coronary Artery Clustered Wound: No Disease, Hypertension, Myocardial Infarction, Hepatitis C, Type II Diabetes, Gout, Osteoarthritis, Neuropathy Photos Wound Measurements Length:  (cm) 0.3 Width: (cm) 0.6 Depth: (cm) 0.6 Area: (cm) 0.141 Volume: (cm) 0.085 % Reduction in Area: 87.2% % Reduction in Volume: 74.2% Epithelialization: Small (1-33%) Tunneling: No Undermining: No Wound Description Classification: Grade 2 Wound Margin: Thickened Exudate Amount: Medium Exudate Type: Serosanguineous Exudate Color: red, brown Foul Odor After Cleansing: No Slough/Fibrino Yes Wound Bed Granulation Amount: Large (67-100%) Exposed Structure Granulation Quality: Red Fascia Exposed: No Necrotic Amount: Small (1-33%) Fat Layer (Subcutaneous Tissue) Exposed: Yes Necrotic Quality: Adherent Slough Tendon Exposed: No Muscle Exposed: No Joint Exposed: No Bone Exposed: No Periwound Skin Texture Texture Color No Abnormalities Noted: No No Abnormalities Noted: Yes Callus: Yes Temperature / Pain Temperature: No Abnormality Moisture No Abnormalities Noted: Yes Tenderness on Palpation: Yes Treatment Notes Wound #8 (Foot) Wound Laterality: Plantar, Right Cleanser Soap and Water Discharge Instruction: May shower and wash wound with dial antibacterial soap and water prior to dressing change. Wound Cleanser Discharge Instruction: Cleanse the wound with wound cleanser prior to applying a clean dressing using gauze sponges, not tissue or cotton balls. Peri-Wound Care Topical Primary Dressing Iodoform packing strip 1/2 (in) Discharge Instruction: Lightly pack as instructed Jacob [name], Jacob [name] ([id number]) [id number] Page 7 of 8 Secondary Dressing Optifoam Non-Adhesive Dressing, 4x4 in Discharge Instruction: Apply over primary dressing cut to make foam donut Woven Gauze Sponges 2x2 in Discharge Instruction: Apply over primary dressing as directed. Secured With Elastic Bandage 4 inch (ACE bandage) Discharge Instruction: Secure with ACE bandage as directed. 58M Medipore H Soft Cloth Surgical T ape, 4 x 10 (in/yd) Discharge Instruction: Secure with  tape as directed. Compression Wrap Compression Stockings Add-Ons Electronic Signature(s) Signed: 08/23/2022 4:24:58 PM By: Adline Peals Entered By: Adline Peals on 08/23/2022 08:21:27 -------------------------------------------------------------------------------- Wound Assessment Details Patient Name: Date of Service: Jacob Ache. 08/23/2022 8:30 A M Medical Record Number: [id number] Patient Account Number: [id number] Date of Birth/Sex: Treating RN: [birth year] (71 y.o. Jacob [name] Primary Care Dennys Guin:  Jacob [name] Other Clinician: Referring Markavious Micco: Treating Jacob [name]/Extender: Neil Crouch in Treatment: 25 Wound Status Wound Number: 9 Primary Open Surgical Wound Etiology: Wound Location: Right, Dorsal Foot Secondary Diabetic Wound/Ulcer of the Lower Extremity Wounding Event: Surgical Injury Etiology: Date Acquired: 07/21/2022 Wound Open Weeks Of Treatment: 4 Status: Clustered Wound: No Comorbid Cataracts, Glaucoma, Anemia, Chronic Obstructive Pulmonary History: Disease (COPD), Congestive Heart Failure, Coronary Artery Disease, Hypertension, Myocardial Infarction, Hepatitis C, Type II Diabetes, Gout, Osteoarthritis, Neuropathy Photos Wound Measurements Length: (cm) Width: (cm) Depth: (cm) Area: (cm) Volume: (cm) 0 % Reduction in Area: 100% 0 % Reduction in Volume: 100% 0 Epithelialization: Large (67-100%) 0 Tunneling: No 0 Undermining: No Wound Description Classification: Full Thickness Without Exposed Support Structures Wound Margin: Flat and Intact Sansom, Talmage Coin ([id number]) Exudate Amount: None Present Foul Odor After Cleansing: No Slough/Fibrino No [id number] Page 8 of 8 Wound Bed Granulation Amount: None Present (0%) Exposed Structure Necrotic Amount: None Present (0%) Fascia Exposed: No Fat Layer (Subcutaneous Tissue) Exposed: No Tendon Exposed: No Muscle  Exposed: No Joint Exposed: No Bone Exposed: No Periwound Skin Texture Texture Color No Abnormalities Noted: Yes No Abnormalities Noted: Yes Moisture Temperature / Pain No Abnormalities Noted: No Temperature: No Abnormality Dry / Scaly: Yes Tenderness on Palpation: Yes Maceration: No Electronic Signature(s) Signed: 08/23/2022 4:24:58 PM By: Adline Peals Entered By: Adline Peals on 08/23/2022 08:29:44 -------------------------------------------------------------------------------- [place] Details Patient Name: Date of Service: [name] 08/23/2022 8:30 A M Medical Record Number: [id number] Patient Account Number: [id number] Date of Birth/Sex: Treating RN: [birth year] (71 y.o. Jacob [name] Primary Care Caprice Wasko: Jacob [name] Other Clinician: Referring Memory Heinrichs: Treating Elyon Zoll/Extender: Neil Crouch in Treatment: 25 Vital Signs Time Taken: 08:11 Temperature (F): 97.7 Height (in): 74 Pulse (bpm): 81 Weight (lbs): 172 Respiratory Rate (breaths/min): 18 Body Mass Index (BMI): 22.1 Blood Pressure (mmHg): 131/74 Reference Range: 80 - 120 mg / dl Electronic Signature(s) Signed: 08/23/2022 4:24:58 PM By: Adline Peals Entered By: Adline Peals on 08/23/2022 08:13:50

## 2022-08-23 NOTE — Telephone Encounter (Signed)
Patient states as soon as he got home after his echocardiogram today he has been having stomach issues. He states he took gas relief medication and would like to know if our office can provide further advisement. Patient states his stomach is "boiling." He states he had a sugar free popsickle, but it didn't provide relief. Patient would like to know if he can have prune juice.

## 2022-08-23 NOTE — Telephone Encounter (Signed)
Called patient back about his message. Patient stated he is having SOB due to stomach issues, and patient stated he feels gassy. Patient stated he felt fine while he was in our office for echo, but after he arrived home he started feeling bad. Encourage patient to go to ED if he is having SOB. Encourage patient to follow up with his PCP for GI symptoms. Patient stated he would try to go to the ED. Patient denies any chest pain.

## 2022-08-23 NOTE — Progress Notes (Signed)
Jacob, [name] ([id number]) [id number] Page 1 of 14 Visit Report for 08/23/2022 Chief Complaint Document Details Patient Name: Date of Service: Jacob [name], Jacob [name] 08/23/2022 8:30 A M Medical Record Number: [id number] Patient Account Number: [id number] Date of Birth/Sex: Treating RN: 03-18-1951 (71 y.o. M) Primary Care Provider: Alain Honey Other Clinician: Referring Provider: Treating Provider/Extender: Neil Crouch in Treatment: 25 Information Obtained from: Patient Chief Complaint Patient seen for complaints of Non-Healing Wounds x 3 Electronic Signature(s) Signed: 08/23/2022 8:47:10 AM By: Fredirick Maudlin MD FACS Entered By: Fredirick Maudlin on 08/23/2022 08:47:10 -------------------------------------------------------------------------------- Debridement Details Patient Name: Date of Service: Jacob [name]. 08/23/2022 8:30 A M Medical Record Number: [id number] Patient Account Number: [id number] Date of Birth/Sex: Treating RN: October 19, 1951 (71 y.o. Janyth Contes Primary Care Provider: Alain Honey Other Clinician: Referring Provider: Treating Provider/Extender: Neil Crouch in Treatment: 25 Debridement Performed for Assessment: Wound #8 Right,Plantar Foot Performed By: Physician Fredirick Maudlin, MD Debridement Type: Debridement Severity of Tissue Pre Debridement: Fat layer exposed Level of Consciousness (Pre-procedure): Awake and Alert Pre-procedure Verification/Time Out Yes - 08:30 Taken: Start Time: 08:30 Pain Control: Lidocaine 4% T opical Solution T Area Debrided (L x W): otal 0.3 (cm) x 0.6 (cm) = 0.18 (cm) Tissue and other material debrided: Non-Viable, Callus Level: Non-Viable Tissue Debridement Description: Selective/Open Wound Instrument: Curette Bleeding: Minimum Hemostasis Achieved: Pressure Response to Treatment: Procedure was tolerated well Level of  Consciousness (Post- Awake and Alert procedure): Post Debridement Measurements of Total Wound Length: (cm) 0.3 Width: (cm) 0.6 Depth: (cm) 0.6 Volume: (cm) 0.085 Character of Wound/Ulcer Post Debridement: Improved Severity of Tissue Post Debridement: Fat layer exposed Post Procedure Diagnosis Same as Pre-procedure Jacob [name] ([id number]) [id number] Page 2 of 14 Notes scribed for Dr. Celine Ahr by Adline Peals, RN Electronic Signature(s) Signed: 08/23/2022 11:15:04 AM By: Fredirick Maudlin MD FACS Signed: 08/23/2022 4:24:58 PM By: Sabas Sous By: Adline Peals on 08/23/2022 08:32:30 -------------------------------------------------------------------------------- HPI Details Patient Name: Date of Service: Jacob [name]. 08/23/2022 8:30 A M Medical Record Number: [id number] Patient Account Number: [id number] Date of Birth/Sex: Treating RN: May 22, 1951 (70 y.o. M) Primary Care Provider: Alain Honey Other Clinician: Referring Provider: Treating Provider/Extender: Neil Crouch in Treatment: 25 History of Present Illness Location: 2 ulcers noted to the left hallux Quality: the patient denies pain Duration: these were originally blisters towards the end of February and opened in early March Context: these wounds are secondary to pressure; he got new shoes in February and also started wearing a toe cover/sleeve Modifying Factors: pressure and friction are exacerbating factor HPI Description: 01/12/17- Mr. Heggs arrives for initial evaluation of 2 ulcers to his left hallux. He states that back in February he did receive a new pair of diabetic shoes and at that time he started wearing toe cover/sleeves and noticed development of a blister to the dorsal and plantar aspect. He has been seen at [facility] by both Dr. Amalia Hailey and Dr. Jacqualyn Posey. He last saw Dr.Wagoner on 3/8 at which time he was prescribed  Silvadene and Keflex. He completed that dose of Keflex and states that he got a phone call to extend the Keflex prior to this appointment. He has a follow-up with Dr. Amalia Hailey on 3/26. He has been wearing a surgical shoe, no significant offloading to the plantar/medial ulcer but the strap does not extend over the dorsal ulcer. Topically he has been applying the Silvadene daily and  a dry dressing. He is a diabetic, on insulin, with a recent A1c of 6.2% (11/10/16). He is a current smoker, 0.5 PPD. He has no known diagnosis of arterial insufficiency. 01/20/17- The patient arrives for follow up evaluation of his ulcers to the left hallux. He admits to continuing to smoke approximately 0.5ppd. He states that his diet has been somewhat uncontrolled with higher than normal glucose levels, this seems circumstantial as he had a death in the family as food and schedule has been disrupted. he admits to wearing the surgical show with offloading modifications while out of the home, but has been ambulating barefoot around the home. he saw Dr. Amalia Hailey yesterday for toenail trimming and callus shaving to the right plantar foot and investigation of his ulcers. 01/26/17- he is here for follow-up evaluation of his left hallux ulcers. He states he has been significantly more compliant in the use of his offloading surgical shoe, with the exception of "getting up for a glass of water" he states that he has worn it with any ambulation. He states he has decreased his smoking too, admitting to only a cigarette since yesterday. He states his blood sugar this morning was 147 and yesterday was 116. He is voicing no complaints or concerns 02/02/17- He is here for follow-up evaluation of his left hallux ulcers. The dorsal ulcer is crusted over/healed. He states he has minimized his ambulation over this past week, much less than the previous week. He states his glucose levels have been between 120-150 about half of the time, otherwise greater  than 150. He cannot correlate any specific dietary changes. He does have an appointment with a dietitian next month. He was advised to keep a log of his glucose levels and his diet for that appointment. 02/06/17; patient comes in today for first application of the total contact cast. Her intake nurse reported the wounds look better therefore I did not actually see these before application of the cast. He'll return on Thursday for reapplication in the standard fashion 02/10/27- he is here for follow-up evaluation of his left plantar hallux ulcer. He came in Monday for application of a total contact cast. He states that he was tolerating that Monday, but noticed on Tuesday that his foot began to slide in the cast. He also is complaining of the heaviness of the cast, as he ambulates as his primary form of transportation. He continues to smoke,. He has not contacted his cardiologist regarding Nicorette gum. He states his blood sugars have been slightly elevated, this morning being 206. He states that he will complete an antibiotic tonight. He states this was prescribed by Dr. Jacqualyn Posey. He states that he has refilled this prescription twice, without direction to continue taking this medication. 02/16/17; patient wound about the same as last week per her description. Rolled edges of callus scan and nonviable subcutaneous tissue. Using Prisma. Apparently used topical antibiotic instead of the Prisma over the course of this week. He has not a total contact cast candidate secondary to ambulation needs and unsteadiness on his feet per discussion with our intake nurse 03/02/17 patient requires debridement. He has a small wound on the right great toe which probes however does not approach bone. What I can see of the base of this wound looks reasonably healthy and I think this is largely in offloading issue with the second toe crossing over the first. He did not tolerate a total contact cast has balance  issues 5/171/8. 0.2x0.2x0.2 less depth. Using prisma 03/16/17; no  major change. Still a small probing wound with copious amounts of surrounding callus skin nonviable subcutaneous tissue all of which debrided. Still using Prisma. I suspect the dimensions post debridement are about the same as listed above 0.2 x 0.2 x 0.2 03/23/17; no major change using Prisma. Once again a large amount of callus nonviable subcutaneous tissue around the wound. I went over the offloading issues with the patient and the nurse. He did not tolerate a total contact cast he uses public transportation among other issues. 03/30/17; no major change using Prisma. Once again a large amount of callus and nonviable tissue around the wound requires debridement. Saw Dr. Amalia Hailey who pared calluses on the other foot. 04/06/17; the wound is much smaller there is callus around this although I think I can see the base of the wound and I elected not to do any debridement. 04/13/17 this is a wound I did not debridement last week left callus intact. He arrives today with the area much in the same predicament however this time I remove the callus to find a deep probing hole. This is disappointing this is not go to bone. We have been using Prisma. 04/20/17 plantar left first toe. Again although a lot of circumferential callus around the wound which requires debridement however this does not appear to be a probing area. Culture I did last week show coag-negative staph. I thought this was probably not something that was worth treating but In the interim he was seen by his primary care with parotitis. He is now on clindamycin and Cipro and the Staphylococcus we cultured is Cipro sensitive [lugdunensis} 04/27/17; plantar left first toe. Again a lot of circumferential callus around a small open area. Removing the callus also nonviable subcutaneous tissue. The wound looks better today. He has completed his antibiotics for the parotitis. I didn't think  additional antibiotics were necessary in terms of the wound DONDRELL, LOUDERMILK ([id number]) [id number] Page 3 of 14 05/11/17; plantar first toe. I thought this might be closed this week however once again he has a deep probing small hole. Using pickups and a scalpel debridement of thick callus and subcutaneous tissue from around the wound reveals a depth of about 0.4 cm. We have not been able to offload the area. There is no obvious infection here. No evidence of ischemia 05/18/17; since his last visit he's been in hospital for oral surgery apparently to remove a retained surgical screw. He has swelling of his jaw and has some discomfort. He arrives today with the plantar left first toe in the same predicament. No major change. Each time he comes in here he has thick adherent callus 05/25/17; arrives today with roughly the same condition of the plantar left great toe. Perhaps somewhat better less callus. There are no options for offloading. He is on his feet a lot and I think this is largely a pressure issue X-ray I did last week showed no evidence of osteomyelitis. He has a toe separator as the great toe tends to ride under the second toe when he is walking. I'm hopeful that this will allow for a little less pressure on the plantar aspect of the left great toe 06/07/2017 -- he is working hard on giving up smoking and other than that has been wearing his diabetic shoes and is also trying to offload as much as possible. 06/15/17; patient arrived in clinic today with a difficult area on his left medial great toe closed over. There is still amount of callus  on this however I don't believe there is any underlying open wound. He has custom shoes with inserts. I still think he is going to need to offload this area going forward. He also follows with Dr. Amalia Hailey of podiatry for routine foot care READMISSION 11/01/16; patient arrives in clinic today with a five-day history of a new area on  the previously problematic left great toe. He is wearing his diabetic shoes and he apparently has a new pair coming. The patient is a type II diabetic with neuropathy. He follows with Dr. Amalia Hailey of podiatry for pre-ulcerative callus on his bilateral feet last seen on 08/24/17. He has developed a large blister on top of his left great toe this is since gone down somewhat. He is not been dressing this specifically. He does not have a history of significant PAD previous ABIs in this clinic were1.1 11/06/17; left great toe has epithelialized since last time. I think this was caused by a insoles an issue pushing the toe against the dorsal aspect of the shoe itself has new diabetic shoes coming hopefully will have more forefoot depth. READMISSION 03/01/2022 This is a now 71 year old man with a past medical history significant for type 2 diabetes mellitus with end-stage renal disease on hemodialysis as as well as congestive heart failure, ischemic cardiomyopathy, hypertension, and prior diabetic foot ulcers. He has had multiple issues with his hemodialysis access. He had a prior left radiocephalic fistula that failed. In October 2020, he had a left brachiobasilic fistula. He apparently underwent a procedure at an outside vascular office on November 09, 2021. Subsequent to that, he developed a wound on his left posterior forearm just distal to the elbow as well as wounds on 2 of his left digits. He was diagnosed with steal syndrome and recently underwent ligation of his fistula with placement of a hemodialysis catheter. He is here today for assistance in healing the wounds that developed related to steal syndrome. He says that he has been applying Neosporin to the sites and they are fairly sensitive to the touch. 03/08/2022: The wounds on his fingers are much smaller and have just a little bit of accumulated thin eschar. The wound on his left posterior forearm is also smaller but has accumulated both slough and  eschar. He also has a thick callus on his right fifth metatarsal head plantar surface and unfortunately, it appears that there is an open wound underneath the callus. We have been using Iodosorb with Hydrofera Blue to all of his open wounds. Pain is much improved from last week. 03/15/2022: The wounds on his fingers have healed. The wound on his left posterior forearm is smaller and has a nice layer of granulation tissue with just a bit of accumulated slough. He has built up additional callus around the right fifth metatarsal head plantar surface, but the underlying wound has a good surface with granulation tissue present. We have been using Iodosorb with Hydrofera Blue. He did not endorse any pain this week. 03/22/2022: The wound on his left posterior forearm continues to contract. There is good granulation tissue present and just a little bit of periwound eschar and overlying slough. The right fifth metatarsal head wound has once again accumulated some callus and there is slough within the wound. The intake nurse measured a area of undermining from 7-10 o'clock. 03/29/2022: The patient saw podiatry this week for nail trim and they also pared back a number of calluses. The wound on his left posterior forearm is substantially smaller with just  some overlying thin eschar. The right fifth metatarsal head has a bit of callus accumulation as well as some slough in the wound bed but is otherwise clean. It is smaller today, as well. 04/05/2022: The wound on his left posterior forearm is nearly closed with just a small amount of overlying eschar. The right fifth metatarsal head wound is smaller again this week. There is some periwound callus accumulation and some slough in the wound bed. He is going to get new dialysis access placed next week. 04/19/2022: The wound on his left posterior forearm is closed. The right fifth metatarsal head wound is about the same size with periwound callus and slough accumulation.  He did have a new right forearm PTFE AV graft placed last week. His right forearm and hand are quite swollen, warm, and red today. He was apparently seen in the emergency room yesterday and they did not think it was infected and attributed the swelling to normal postop findings. 04/28/2022: The wound on his foot has accumulated a lot of periwound callus, but the actual open surface is quite small. There is just a little bit of slough on the surface. 05/03/2022: His wound is even smaller today with less periwound callus accumulation. He continues to build up slough but the underlying tissue is healthy and viable-appearing. 05/10/2022: The orifice of the wound continues to contract, but there is some undermining and periwound callus formation. No significant accumulation of slough. 05/17/2022: The wound is about the same size today, perhaps slightly smaller. He does have periwound callus accumulation, but it does not overlap or create any undermining. There is some slough in the base of the wound. 05/31/2022: The wound measured larger today on intake, but this may be secondary to the fairly aggressive debridement I performed at his last visit. Despite this, he has built up callus around the wound again. There is some epiboly around the orifice. There is slough within the wound and some undermining is present. His blood sugar remains very poorly controlled; it was 300 today. 06/16/2022: No real change in the size of his wound. He has callus accumulation around the perimeter. Last visit we changed his dressing to Prisma; I have not seen much in the way of filling in from the base yet. 06/30/2022: No real change to his wound. There is some undermining that has been created by accumulated callus. We are struggling with poor glucose control in the setting of immunodeficiency and end-stage renal disease on dialysis. 07/14/2022: The wound has gotten deeper and a probe placed into the wound can be palpated coming to  just under the skin on the dorsal aspect of his foot. The wound itself is fairly clean and there is no malodor or purulent drainage present. He is scheduled to see podiatry next week and hopefully get x-rays performed at that visit. He is also requesting a knee scooter. 07/26/2022: A couple of days after our last visit, he presented to the emergency department with worsening of his wound. Radiographic imaging was consistent with osteomyelitis. He was taken to the operating room by Dr. Loel Lofty of podiatry. He underwent irrigation and debridement of his right foot wound and resection of the right fourth metatarsal head and proximal phalanx. Cultures grew out staph. He is currently receiving IV vancomycin and ceftazidime with SHAFT, CORIGLIANO ([id number]) [id number] Page 4 of 14 hemodialysis. The wound is very clean today. There is no slough buildup. No purulent drainage. It does probe through to the dorsum of his foot to his  surgical incision. Sutures are still in place. 08/04/2022: The surgical incision has partially dehisced. There are still Prolene sutures in situ. There is some slough accumulation on the open surface behind the sutures. On the plantar surface, the wound has accumulated a thick periwound callus. There is some undermining present. 10/19; surgical wound plantar fourth met head right foot. He has had a ray resection apparently. The wound probes through to his dorsal foot. In spite of this the patient appears well. He is still on antibiotics at dialysis apparently vancomycin and ceftriaxone. We are packing this with iodoform 08/23/2022: He saw Dr. Loel Lofty last week and had his sutures removed. The dorsal foot wound has just a little bit of crust and eschar on it, but no sign of dehiscence. The plantar wound depth has come in considerably. It is clean with just periwound callus accumulation. Electronic Signature(s) Signed: 08/23/2022 8:47:59 AM By:  Fredirick Maudlin MD FACS Entered By: Fredirick Maudlin on 08/23/2022 08:47:58 -------------------------------------------------------------------------------- Physical Exam Details Patient Name: Date of Service: Jacob [name]. 08/23/2022 8:30 A M Medical Record Number: [id number] Patient Account Number: [id number] Date of Birth/Sex: Treating RN: 1951-07-24 (71 y.o. M) Primary Care Provider: Alain Honey Other Clinician: Referring Provider: Treating Provider/Extender: Neil Crouch in Treatment: 25 Constitutional . . . . No acute distress.[name] Respiratory Normal work of breathing on room air.. Notes 08/23/2022: The dorsal foot wound has just a little bit of crust and eschar on it, but no sign of dehiscence. The plantar wound depth has come in considerably. It is clean with just periwound callus accumulation. Electronic Signature(s) Signed: 08/23/2022 8:57:35 AM By: Fredirick Maudlin MD FACS Previous Signature: 08/23/2022 8:49:11 AM Version By: Fredirick Maudlin MD FACS Entered By: Fredirick Maudlin on 08/23/2022 08:57:35 -------------------------------------------------------------------------------- Physician Orders Details Patient Name: Date of Service: Jacob [name]. 08/23/2022 8:30 A M Medical Record Number: [id number] Patient Account Number: [id number] Date of Birth/Sex: Treating RN: 12-15-1950 (71 y.o. Janyth Contes Primary Care Provider: Alain Honey Other Clinician: Referring Provider: Treating Provider/Extender: Neil Crouch in Treatment: 25 Verbal / Phone Orders: No Diagnosis Coding ICD-10 Coding Code Description [phone] Non-pressure chronic ulcer of other part of right foot with fat layer exposed E11.22 Type 2 diabetes mellitus with diabetic chronic kidney disease J44.9 Chronic obstructive pulmonary disease, unspecified I50.42 Chronic combined systolic (congestive) and diastolic (congestive) heart  failure ARYAMAN, HALIBURTON ([id number]) [id number] Page 5 of 14 I10 Essential (primary) hypertension N25.81 Secondary hyperparathyroidism of renal origin N18.6 End stage renal disease B20 Human immunodeficiency virus [HIV] disease M86.671 Other chronic osteomyelitis, right ankle and foot Follow-up Appointments ppointment in 1 week. - Dr. Celine Ahr RM 1 with [name] Return A Anesthetic Wound #8 Right,Plantar Foot (In clinic) Topical Lidocaine 4% applied to wound bed Bathing/ Shower/ Hygiene Other Bathing/Shower/Hygiene Orders/Instructions: - Change dressing after bathing Off-Loading Other: - minimal weight bearing right foot Home Health dmit to [place] for wound care. May utilize formulary equivalent dressing for wound treatment orders unless otherwise specified. - A pt has dialysis MWF mornings No change in wound care orders this week; continue Home Health for wound care. May utilize formulary equivalent dressing for wound treatment orders unless otherwise specified. Other Home Health Orders/Instructions: - [facility] Wound Treatment Wound #8 - Foot Wound Laterality: Plantar, Right Cleanser: Soap and Water 3 x Per Week/30 Days Discharge Instructions: May shower and wash wound with dial antibacterial soap and water prior to dressing change. Cleanser: Wound Cleanser (Generic) 3  x Per Week/30 Days Discharge Instructions: Cleanse the wound with wound cleanser prior to applying a clean dressing using gauze sponges, not tissue or cotton balls. Prim Dressing: Iodoform packing strip 1/2 (in) 3 x Per Week/30 Days ary Discharge Instructions: Lightly pack as instructed Secondary Dressing: Optifoam Non-Adhesive Dressing, 4x4 in 3 x Per Week/30 Days Discharge Instructions: Apply over primary dressing cut to make foam donut Secondary Dressing: Woven Gauze Sponges 2x2 in 3 x Per Week/30 Days Discharge Instructions: Apply over primary dressing as directed. Secured With:  Elastic Bandage 4 inch (ACE bandage) 3 x Per Week/30 Days Discharge Instructions: Secure with ACE bandage as directed. Secured With: 70M Medipore H Soft Cloth Surgical T ape, 4 x 10 (in/yd) 3 x Per Week/30 Days Discharge Instructions: Secure with tape as directed. Patient Medications llergies: Amphetamine Analogues A Notifications Medication Indication Start End 08/23/2022 lidocaine DOSE topical 4 % cream - cream topical Electronic Signature(s) Signed: 08/23/2022 11:15:04 AM By: Fredirick Maudlin MD FACS Entered By: Fredirick Maudlin on 08/23/2022 08:57:53 -------------------------------------------------------------------------------- Problem List Details Patient Name: Date of Service: Jacob [name]. 08/23/2022 8:30 A Bruce Donath ([id number]) [id number] Page 6 of 14 Medical Record Number: [id number] Patient Account Number: [id number] Date of Birth/Sex: Treating RN: 04/09/1951 (71 y.o. M) Primary Care Provider: Alain Honey Other Clinician: Referring Provider: Treating Provider/Extender: Neil Crouch in Treatment: 25 Active Problems ICD-10 Encounter Code Description Active Date MDM Diagnosis L97.512 Non-pressure chronic ulcer of other part of right foot with fat layer exposed 03/08/2022 No Yes E11.22 Type 2 diabetes mellitus with diabetic chronic kidney disease 03/01/2022 No Yes J44.9 Chronic obstructive pulmonary disease, unspecified 03/01/2022 No Yes I50.42 Chronic combined systolic (congestive) and diastolic (congestive) heart failure 03/01/2022 No Yes I10 Essential (primary) hypertension 03/01/2022 No Yes N25.81 Secondary hyperparathyroidism of renal origin 03/01/2022 No Yes N18.6 End stage renal disease 03/01/2022 No Yes B20 Human immunodeficiency virus [HIV] disease 03/01/2022 No Yes M86.671 Other chronic osteomyelitis, right ankle and foot 07/26/2022 No Yes Inactive Problems Resolved Problems ICD-10 Code Description  Active Date Resolved Date L98.499 Non-pressure chronic ulcer of skin of other sites with unspecified severity 03/01/2022 03/01/2022 Electronic Signature(s) Signed: 08/23/2022 8:46:54 AM By: Fredirick Maudlin MD FACS Entered By: Fredirick Maudlin on 08/23/2022 08:46:54 -------------------------------------------------------------------------------- Progress Note Details Patient Name: Date of Service: [name] 08/23/2022 8:30 A M Medical Record Number: [id number] Patient Account Number: [id number] CAROLYN, MANISCALCO ([id number]) [id number] Page 7 of 14 Date of Birth/Sex: Treating RN: 02-27-1951 (71 y.o. M) Primary Care Provider: Other Clinician: Alain Honey Referring Provider: Treating Provider/Extender: Neil Crouch in Treatment: 25 Subjective Chief Complaint Information obtained from Patient Patient seen for complaints of Non-Healing Wounds x 3 History of Present Illness (HPI) The following HPI elements were documented for the patient's wound: Location: 2 ulcers noted to the left hallux Quality: the patient denies pain Duration: these were originally blisters towards the end of February and opened in early March Context: these wounds are secondary to pressure; he got new shoes in February and also started wearing a toe cover/sleeve Modifying Factors: pressure and friction are exacerbating factor 01/12/17- Mr. Mazon arrives for initial evaluation of 2 ulcers to his left hallux. He states that back in February he did receive a new pair of diabetic shoes and at that time he started wearing toe cover/sleeves and noticed development of a blister to the dorsal and plantar aspect. He has been seen at [facility] by both  Dr. Amalia Hailey and Dr. Jacqualyn Posey. He last saw Dr.Wagoner on 3/8 at which time he was prescribed Silvadene and Keflex. He completed that dose of Keflex and states that he got a phone call to extend the Keflex prior to  this appointment. He has a follow-up with Dr. Amalia Hailey on 3/26. He has been wearing a surgical shoe, no significant offloading to the plantar/medial ulcer but the strap does not extend over the dorsal ulcer. T opically he has been applying the Silvadene daily and a dry dressing. He is a diabetic, on insulin, with a recent A1c of 6.2% (11/10/16). He is a current smoker, 0.5 PPD. He has no known diagnosis of arterial insufficiency. 01/20/17- The patient arrives for follow up evaluation of his ulcers to the left hallux. He admits to continuing to smoke approximately 0.5ppd. He states that his diet has been somewhat uncontrolled with higher than normal glucose levels, this seems circumstantial as he had a death in the family as food and schedule has been disrupted. he admits to wearing the surgical show with offloading modifications while out of the home, but has been ambulating barefoot around the home. he saw Dr. Amalia Hailey yesterday for toenail trimming and callus shaving to the right plantar foot and investigation of his ulcers. 01/26/17- he is here for follow-up evaluation of his left hallux ulcers. He states he has been significantly more compliant in the use of his offloading surgical shoe, with the exception of "getting up for a glass of water" he states that he has worn it with any ambulation. He states he has decreased his smoking too, admitting to only a cigarette since yesterday. He states his blood sugar this morning was 147 and yesterday was 116. He is voicing no complaints or concerns 02/02/17- He is here for follow-up evaluation of his left hallux ulcers. The dorsal ulcer is crusted over/healed. He states he has minimized his ambulation over this past week, much less than the previous week. He states his glucose levels have been between 120-150 about half of the time, otherwise greater than 150. He cannot correlate any specific dietary changes. He does have an appointment with a dietitian next month. He  was advised to keep a log of his glucose levels and his diet for that appointment. 02/06/17; patient comes in today for first application of the total contact cast. Her intake nurse reported the wounds look better therefore I did not actually see these before application of the cast. He'll return on Thursday for reapplication in the standard fashion 02/10/27- he is here for follow-up evaluation of his left plantar hallux ulcer. He came in Monday for application of a total contact cast. He states that he was tolerating that Monday, but noticed on Tuesday that his foot began to slide in the cast. He also is complaining of the heaviness of the cast, as he ambulates as his primary form of transportation. He continues to smoke,. He has not contacted his cardiologist regarding Nicorette gum. He states his blood sugars have been slightly elevated, this morning being 206. He states that he will complete an antibiotic tonight. He states this was prescribed by Dr. Jacqualyn Posey. He states that he has refilled this prescription twice, without direction to continue taking this medication. 02/16/17; patient wound about the same as last week per her description. Rolled edges of callus scan and nonviable subcutaneous tissue. Using Prisma. Apparently used topical antibiotic instead of the Prisma over the course of this week. He has not a total contact cast candidate  secondary to ambulation needs and unsteadiness on his feet per discussion with our intake nurse 03/02/17 patient requires debridement. He has a small wound on the right great toe which probes however does not approach bone. What I can see of the base of this wound looks reasonably healthy and I think this is largely in offloading issue with the second toe crossing over the first. He did not tolerate a total contact cast has balance issues 5/171/8. 0.2x0.2x0.2 less depth. Using prisma 03/16/17; no major change. Still a small probing wound with copious amounts of  surrounding callus skin nonviable subcutaneous tissue all of which debrided. Still using Prisma. I suspect the dimensions post debridement are about the same as listed above 0.2 x 0.2 x 0.2 03/23/17; no major change using Prisma. Once again a large amount of callus nonviable subcutaneous tissue around the wound. I went over the offloading issues with the patient and the nurse. He did not tolerate a total contact cast he uses public transportation among other issues. 03/30/17; no major change using Prisma. Once again a large amount of callus and nonviable tissue around the wound requires debridement. Saw Dr. Amalia Hailey who pared calluses on the other foot. 04/06/17; the wound is much smaller there is callus around this although I think I can see the base of the wound and I elected not to do any debridement. 04/13/17 this is a wound I did not debridement last week left callus intact. He arrives today with the area much in the same predicament however this time I remove the callus to find a deep probing hole. This is disappointing this is not go to bone. We have been using Prisma. 04/20/17 plantar left first toe. Again although a lot of circumferential callus around the wound which requires debridement however this does not appear to be a probing area. Culture I did last week show coag-negative staph. I thought this was probably not something that was worth treating but In the interim he was seen by his primary care with parotitis. He is now on clindamycin and Cipro and the Staphylococcus we cultured is Cipro sensitive [lugdunensis} 04/27/17; plantar left first toe. Again a lot of circumferential callus around a small open area. Removing the callus also nonviable subcutaneous tissue. The wound looks better today. He has completed his antibiotics for the parotitis. I didn't think additional antibiotics were necessary in terms of the wound 05/11/17; plantar first toe. I thought this might be closed this week however once  again he has a deep probing small hole. Using pickups and a scalpel debridement of thick callus and subcutaneous tissue from around the wound reveals a depth of about 0.4 cm. We have not been able to offload the area. There is no obvious infection here. No evidence of ischemia 05/18/17; since his last visit he's been in hospital for oral surgery apparently to remove a retained surgical screw. He has swelling of his jaw and has some discomfort. He arrives today with the plantar left first toe in the same predicament. No major change. Each time he comes in here he has thick adherent callus 05/25/17; arrives today with roughly the same condition of the plantar left great toe. Perhaps somewhat better less callus. There are no options for offloading. He is on his feet a lot and I think this is largely a pressure issue X-ray I did last week showed no evidence of osteomyelitis. He has a toe separator as the great toe tends to ride under the second toe when  he is walking. I'm hopeful that this will allow for a little less pressure on the plantar aspect of the left great toe 06/07/2017 -- he is working hard on giving up smoking and other than that has been wearing his diabetic shoes and is also trying to offload as much as possible. 06/15/17; patient arrived in clinic today with a difficult area on his left medial great toe closed over. There is still amount of callus on this however I don't believe there is any underlying open wound. He has custom shoes with inserts. I still think he is going to need to offload this area going forward. He also follows with Dr. Amalia Hailey of podiatry for routine foot care READMISSION 11/01/16; patient arrives in clinic today with a five-day history of a new area on the previously problematic left great toe. He is wearing his diabetic shoes and he CASPAR, FAVILA ([id number]) [id number] Page 8 of 14 apparently has a new pair coming. The patient is a type II  diabetic with neuropathy. He follows with Dr. Amalia Hailey of podiatry for pre-ulcerative callus on his bilateral feet last seen on 08/24/17. He has developed a large blister on top of his left great toe this is since gone down somewhat. He is not been dressing this specifically. He does not have a history of significant PAD previous ABIs in this clinic were1.1 11/06/17; left great toe has epithelialized since last time. I think this was caused by a insoles an issue pushing the toe against the dorsal aspect of the shoe itself has new diabetic shoes coming hopefully will have more forefoot depth. READMISSION 03/01/2022 This is a now 71 year old man with a past medical history significant for type 2 diabetes mellitus with end-stage renal disease on hemodialysis as as well as congestive heart failure, ischemic cardiomyopathy, hypertension, and prior diabetic foot ulcers. He has had multiple issues with his hemodialysis access. He had a prior left radiocephalic fistula that failed. In October 2020, he had a left brachiobasilic fistula. He apparently underwent a procedure at an outside vascular office on November 09, 2021. Subsequent to that, he developed a wound on his left posterior forearm just distal to the elbow as well as wounds on 2 of his left digits. He was diagnosed with steal syndrome and recently underwent ligation of his fistula with placement of a hemodialysis catheter. He is here today for assistance in healing the wounds that developed related to steal syndrome. He says that he has been applying Neosporin to the sites and they are fairly sensitive to the touch. 03/08/2022: The wounds on his fingers are much smaller and have just a little bit of accumulated thin eschar. The wound on his left posterior forearm is also smaller but has accumulated both slough and eschar. He also has a thick callus on his right fifth metatarsal head plantar surface and unfortunately, it appears that there is an open wound  underneath the callus. We have been using Iodosorb with Hydrofera Blue to all of his open wounds. Pain is much improved from last week. 03/15/2022: The wounds on his fingers have healed. The wound on his left posterior forearm is smaller and has a nice layer of granulation tissue with just a bit of accumulated slough. He has built up additional callus around the right fifth metatarsal head plantar surface, but the underlying wound has a good surface with granulation tissue present. We have been using Iodosorb with Hydrofera Blue. He did not endorse any pain this week. 03/22/2022: The wound  on his left posterior forearm continues to contract. There is good granulation tissue present and just a little bit of periwound eschar and overlying slough. The right fifth metatarsal head wound has once again accumulated some callus and there is slough within the wound. The intake nurse measured a area of undermining from 7-10 o'clock. 03/29/2022: The patient saw podiatry this week for nail trim and they also pared back a number of calluses. The wound on his left posterior forearm is substantially smaller with just some overlying thin eschar. The right fifth metatarsal head has a bit of callus accumulation as well as some slough in the wound bed but is otherwise clean. It is smaller today, as well. 04/05/2022: The wound on his left posterior forearm is nearly closed with just a small amount of overlying eschar. The right fifth metatarsal head wound is smaller again this week. There is some periwound callus accumulation and some slough in the wound bed. He is going to get new dialysis access placed next week. 04/19/2022: The wound on his left posterior forearm is closed. The right fifth metatarsal head wound is about the same size with periwound callus and slough accumulation. He did have a new right forearm PTFE AV graft placed last week. His right forearm and hand are quite swollen, warm, and red today. He  was apparently seen in the emergency room yesterday and they did not think it was infected and attributed the swelling to normal postop findings. 04/28/2022: The wound on his foot has accumulated a lot of periwound callus, but the actual open surface is quite small. There is just a little bit of slough on the surface. 05/03/2022: His wound is even smaller today with less periwound callus accumulation. He continues to build up slough but the underlying tissue is healthy and viable-appearing. 05/10/2022: The orifice of the wound continues to contract, but there is some undermining and periwound callus formation. No significant accumulation of slough. 05/17/2022: The wound is about the same size today, perhaps slightly smaller. He does have periwound callus accumulation, but it does not overlap or create any undermining. There is some slough in the base of the wound. 05/31/2022: The wound measured larger today on intake, but this may be secondary to the fairly aggressive debridement I performed at his last visit. Despite this, he has built up callus around the wound again. There is some epiboly around the orifice. There is slough within the wound and some undermining is present. His blood sugar remains very poorly controlled; it was 300 today. 06/16/2022: No real change in the size of his wound. He has callus accumulation around the perimeter. Last visit we changed his dressing to Prisma; I have not seen much in the way of filling in from the base yet. 06/30/2022: No real change to his wound. There is some undermining that has been created by accumulated callus. We are struggling with poor glucose control in the setting of immunodeficiency and end-stage renal disease on dialysis. 07/14/2022: The wound has gotten deeper and a probe placed into the wound can be palpated coming to just under the skin on the dorsal aspect of his foot. The wound itself is fairly clean and there is no malodor or purulent drainage  present. He is scheduled to see podiatry next week and hopefully get x-rays performed at that visit. He is also requesting a knee scooter. 07/26/2022: A couple of days after our last visit, he presented to the emergency department with worsening of his wound. Radiographic imaging  was consistent with osteomyelitis. He was taken to the operating room by Dr. Loel Lofty of podiatry. He underwent irrigation and debridement of his right foot wound and resection of the right fourth metatarsal head and proximal phalanx. Cultures grew out staph. He is currently receiving IV vancomycin and ceftazidime with hemodialysis. The wound is very clean today. There is no slough buildup. No purulent drainage. It does probe through to the dorsum of his foot to his surgical incision. Sutures are still in place. 08/04/2022: The surgical incision has partially dehisced. There are still Prolene sutures in situ. There is some slough accumulation on the open surface behind the sutures. On the plantar surface, the wound has accumulated a thick periwound callus. There is some undermining present. 10/19; surgical wound plantar fourth met head right foot. He has had a ray resection apparently. The wound probes through to his dorsal foot. In spite of this the patient appears well. He is still on antibiotics at dialysis apparently vancomycin and ceftriaxone. We are packing this with iodoform 08/23/2022: He saw Dr. Loel Lofty last week and had his sutures removed. The dorsal foot wound has just a little bit of crust and eschar on it, but no sign of dehiscence. The plantar wound depth has come in considerably. It is clean with just periwound callus accumulation. Patient History Information obtained from Patient. Family History Diabetes - Siblings,Father, Heart Disease - Father,Siblings, Hypertension - Father,Siblings, No family history of Cancer, Hereditary Spherocytosis, Kidney Disease, Lung Disease, Seizures, Stroke, Thyroid  Problems, Tuberculosis. AMIRI, RIECHERS ([id number]) [id number] Page 9 of 14 Social History Current every day smoker - 1/2 ppd / 64yr, Marital Status - Single, Alcohol Use - Moderate - 6 standard drinks per week, Drug Use - Prior History - Polysubstance Abuse; clean for 9 years, Caffeine Use - Rarely - tea. Medical History Eyes Patient has history of Cataracts - mild, Glaucoma Denies history of Optic Neuritis Ear/Nose/Mouth/Throat Denies history of Chronic sinus problems/congestion, Middle ear problems Hematologic/Lymphatic Patient has history of Anemia Denies history of Hemophilia, Human Immunodeficiency Virus, Lymphedema, Sickle Cell Disease Respiratory Patient has history of Chronic Obstructive Pulmonary Disease (COPD) Denies history of Aspiration, Asthma, Pneumothorax, Sleep Apnea, Tuberculosis Cardiovascular Patient has history of Congestive Heart Failure, Coronary Artery Disease, Hypertension, Myocardial Infarction - 2004 Denies history of Angina, Arrhythmia, Deep Vein Thrombosis, Hypotension, Peripheral Arterial Disease, Peripheral Venous Disease, Phlebitis, Vasculitis Gastrointestinal Patient has history of Hepatitis C - 1968 Denies history of Cirrhosis , Colitis, Crohnoos, Hepatitis A, Hepatitis B Endocrine Patient has history of Type II Diabetes Denies history of Type I Diabetes Genitourinary Denies history of End Stage Renal Disease Immunological Denies history of Lupus Erythematosus, Raynaudoos, Scleroderma Integumentary (Skin) Denies history of History of Burn Musculoskeletal Patient has history of Gout, Osteoarthritis Denies history of Rheumatoid Arthritis, Osteomyelitis Neurologic Patient has history of Neuropathy Denies history of Dementia, Quadriplegia, Paraplegia, Seizure Disorder Oncologic Denies history of Received Chemotherapy, Received Radiation Psychiatric Denies history of Anorexia/bulimia, Confinement  Anxiety Hospitalization/Surgery History - Colonoscopy. - CABG. - Laproscopic Cholecystectomy. - Left Hip Fx. - Left Jaw Fx. - Lumbar Laminectomy. Medical A Surgical History Notes nd Constitutional Symptoms (General Health) Insomnia Syncope Alcohol Dependence HIV Hematologic/Lymphatic Hyperlipidemia Hyperglycemia Cardiovascular CABG, ischemic cardiomyopathy Gastrointestinal GERD Genitourinary Syphilis CKD,Nocturia Immunological HIV Musculoskeletal Hip Fx BiLateral Bunions Unspecified Arthritis Neurologic chronic back pain Objective Constitutional No acute distress.. Vitals Time Taken: 8:11 AM, Height: 74 in, Weight: 172 lbs, BMI: 22.1, Temperature: 97.7 F, Pulse: 81 bpm, Respiratory Rate: 18 breaths/min, Blood Pressure: 131/74 mmHg. Respiratory  Normal work of breathing on room air.. General Notes: 08/23/2022: The dorsal foot wound has just a little bit of crust and eschar on it, but no sign of dehiscence. The plantar wound depth has come in considerably. It is clean with just periwound callus accumulation. Integumentary (Hair, Skin) Wound #8 status is Open. Original cause of wound was Blister. The date acquired was: 03/08/2022. The wound has been in treatment 24 weeks. The wound is located on the [place]. The wound measures 0.3cm length x 0.6cm width x 0.6cm depth; 0.141cm^2 area and 0.085cm^3 volume. There is Fat PEGGY, MONK ([id number]) [id number] Page 10 of 14 (Subcutaneous Tissue) exposed. There is no tunneling or undermining noted. There is a medium amount of serosanguineous drainage noted. The wound margin is thickened. There is large (67-100%) red granulation within the wound bed. There is a small (1-33%) amount of necrotic tissue within the wound bed including Adherent Slough. The periwound skin appearance had no abnormalities noted for moisture. The periwound skin appearance had no abnormalities noted for color. The  periwound skin appearance exhibited: Callus. Periwound temperature was noted as No Abnormality. The periwound has tenderness on palpation. Wound #9 status is Open. Original cause of wound was Surgical Injury. The date acquired was: 07/21/2022. The wound has been in treatment 4 weeks. The wound is located on the Right,Dorsal Foot. The wound measures 0cm length x 0cm width x 0cm depth; 0cm^2 area and 0cm^3 volume. There is no tunneling or undermining noted. There is a none present amount of drainage noted. The wound margin is flat and intact. There is no granulation within the wound bed. There is no necrotic tissue within the wound bed. The periwound skin appearance had no abnormalities noted for texture. The periwound skin appearance had no abnormalities noted for color. The periwound skin appearance exhibited: Dry/Scaly. The periwound skin appearance did not exhibit: Maceration. Periwound temperature was noted as No Abnormality. The periwound has tenderness on palpation. 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 Chronic obstructive pulmonary disease, unspecified Chronic combined systolic (congestive) and diastolic (congestive) heart failure Essential (primary) hypertension Secondary hyperparathyroidism of renal origin End stage renal disease Human immunodeficiency virus [HIV] disease Other chronic osteomyelitis, right ankle and foot Procedures Wound #8 Pre-procedure diagnosis of Wound #8 is a Diabetic Wound/Ulcer of the Lower Extremity located on the Right,Plantar Foot .Severity of Tissue Pre Debridement is: Fat layer exposed. There was a Selective/Open Wound Non-Viable Tissue Debridement with a total area of 0.18 sq cm performed by Fredirick Maudlin, MD. With the following instrument(s): Curette to remove Non-Viable tissue/material. Material removed includes Callus  after achieving pain control using Lidocaine 4% T opical Solution. No specimens were taken. A time out was conducted at 08:30, prior to the start of the procedure. A Minimum amount of bleeding was controlled with Pressure. The procedure was tolerated well. Post Debridement Measurements: 0.3cm length x 0.6cm width x 0.6cm depth; 0.085cm^3 volume. Character of Wound/Ulcer Post Debridement is improved. Severity of Tissue Post Debridement is: Fat layer exposed. Post procedure Diagnosis Wound #8: Same as Pre-Procedure General Notes: scribed for Dr. Celine Ahr by Adline Peals, RN. Plan Follow-up Appointments: Return Appointment in 1 week. - Dr. Celine Ahr RM 1 with [name] Anesthetic: Wound #8 Right,Plantar Foot: (In clinic) Topical Lidocaine 4% applied to wound bed Bathing/ Shower/ Hygiene: Other Bathing/Shower/Hygiene Orders/Instructions: - Change dressing after bathing Off-Loading: Other: - minimal weight bearing  right foot Home Health: Admit to [facility] for wound care. May utilize formulary equivalent dressing for wound treatment orders unless otherwise specified. - pt has dialysis MWF mornings No change in wound care orders this week; continue Home Health for wound care. May utilize formulary equivalent dressing for wound treatment orders unless otherwise specified. Other Home Health Orders/Instructions: - [facility] The following medication(s) was prescribed: lidocaine topical 4 % cream cream topical was prescribed at facility WOUND #8: - Foot Wound Laterality: Plantar, Right Cleanser: Soap and Water 3 x Per Week/30 Days Discharge Instructions: May shower and wash wound with dial antibacterial soap and water prior to dressing change. Cleanser: Wound Cleanser (Generic) 3 x Per Week/30 Days Discharge Instructions: Cleanse the wound with wound cleanser prior to applying a clean dressing using gauze sponges, not tissue or cotton balls. Prim Dressing: Iodoform packing strip 1/2 (in) 3 x Per  Week/30 Days ary Discharge Instructions: Lightly pack as instructed Secondary Dressing: Optifoam Non-Adhesive Dressing, 4x4 in 3 x Per Week/30 Days Discharge Instructions: Apply over primary dressing cut to make foam donut Secondary Dressing: Woven Gauze Sponges 2x2 in 3 x Per Week/30 Days Discharge Instructions: Apply over primary dressing as directed. Secured With: Elastic Bandage 4 inch (ACE bandage) 3 x Per Week/30 Days Discharge Instructions: Secure with ACE bandage as directed. Secured With: 75M Medipore H Soft Cloth Surgical T ape, 4 x 10 (in/yd) 3 x Per Week/30 Days Discharge Instructions: Secure with tape as directed. DESIREE, FLEMING ([id number]) [id number] Page 11 of 14 08/23/2022: The dorsal foot wound has just a little bit of crust and eschar on it, but no sign of dehiscence. The plantar wound depth has come in considerably. It is clean with just periwound callus accumulation. Once I cleaned the crust and eschar off the dorsal foot, it was found to be closed without any open portion. I debrided callus off of the plantar foot wound. We will continue iodoform packing to this site. Although the topic of hyperbaric oxygen therapy was broached with the patient at his last visit, I think he is healing well and on a good trajectory; I am not sure there is a whole lot of benefit to be derived from treatment. He has 2 more weeks of IV antibiotics, which he receives at dialysis. Follow-up with me in 1 week. Electronic Signature(s) Signed: 08/23/2022 8:59:10 AM By: Fredirick Maudlin MD FACS Entered By: Fredirick Maudlin on 08/23/2022 08:59:10 -------------------------------------------------------------------------------- HxROS Details Patient Name: Date of Service: Jacob [name]. 08/23/2022 8:30 A M Medical Record Number: [id number] Patient Account Number: [id number] Date of Birth/Sex: Treating RN: 10-Dec-1950 (71 y.o. M) Primary Care Provider: Alain Honey  Other Clinician: Referring Provider: Treating Provider/Extender: Neil Crouch in Treatment: 25 Information Obtained From Patient Constitutional Symptoms (General Health) Medical History: Past Medical History Notes: Insomnia Syncope Alcohol Dependence HIV Eyes Medical History: Positive for: Cataracts - mild; Glaucoma Negative for: Optic Neuritis Ear/Nose/Mouth/Throat Medical History: Negative for: Chronic sinus problems/congestion; Middle ear problems Hematologic/Lymphatic Medical History: Positive for: Anemia Negative for: Hemophilia; Human Immunodeficiency Virus; Lymphedema; Sickle Cell Disease Past Medical History Notes: Hyperlipidemia Hyperglycemia Respiratory Medical History: Positive for: Chronic Obstructive Pulmonary Disease (COPD) Negative for: Aspiration; Asthma; Pneumothorax; Sleep Apnea; Tuberculosis Cardiovascular Medical History: Positive for: Congestive Heart Failure; Coronary Artery Disease; Hypertension; Myocardial Infarction - 2004 Negative for: Angina; Arrhythmia; Deep Vein Thrombosis; Hypotension; Peripheral Arterial Disease; Peripheral Venous Disease; Phlebitis; Vasculitis Past Medical History Notes: CABG, ischemic cardiomyopathy Gastrointestinal BASTION, BOLGER ([id number]) [id number] Page  12 of 14 Medical History: Positive for: Hepatitis C - 1968 Negative for: Cirrhosis ; Colitis; Crohns; Hepatitis A; Hepatitis B Past Medical History Notes: GERD Endocrine Medical History: Positive for: Type II Diabetes Negative for: Type I Diabetes Time with diabetes: 13 yrs Treated with: Insulin Blood sugar tested every day: Yes Tested : 2 times per day Blood sugar testing results: Breakfast: 100; Dinner: 95-126 Genitourinary Medical History: Negative for: End Stage Renal Disease Past Medical History Notes: Syphilis CKD,Nocturia Immunological Medical History: Negative for: Lupus Erythematosus;  Raynauds; Scleroderma Past Medical History Notes: HIV Integumentary (Skin) Medical History: Negative for: History of Burn Musculoskeletal Medical History: Positive for: Gout; Osteoarthritis Negative for: Rheumatoid Arthritis; Osteomyelitis Past Medical History Notes: Hip Fx BiLateral Bunions Unspecified Arthritis Neurologic Medical History: Positive for: Neuropathy Negative for: Dementia; Quadriplegia; Paraplegia; Seizure Disorder Past Medical History Notes: chronic back pain Oncologic Medical History: Negative for: Received Chemotherapy; Received Radiation Psychiatric Medical History: Negative for: Anorexia/bulimia; Confinement Anxiety HBO Extended History Items Eyes: Eyes: Cataracts Glaucoma Immunizations Pneumococcal Vaccine: Received Pneumococcal Vaccination: Yes Received Pneumococcal Vaccination On or After 60th Birthday: Yes Implantable Devices No devices added Hospitalization / Surgery History DESTIN, VINSANT ([id number]) [id number] Page 13 of 14 Type of Hospitalization/Surgery Colonoscopy CABG Laproscopic Cholecystectomy Left Hip Fx Left Jaw Fx Lumbar Laminectomy Family and Social History Cancer: No; Diabetes: Yes - Siblings,Father; Heart Disease: Yes - Father,Siblings; Hereditary Spherocytosis: No; Hypertension: Yes - Father,Siblings; Kidney Disease: No; Lung Disease: No; Seizures: No; Stroke: No; Thyroid Problems: No; Tuberculosis: No; Current every day smoker - 1/2 ppd / 77yr; Marital Status - Single; Alcohol Use: Moderate - 6 standard drinks per week; Drug Use: Prior History - Polysubstance Abuse; clean for 9 years; Caffeine Use: Rarely - tea; Financial Concerns: No; Food, Clothing or Shelter Needs: No; Support System Lacking: No; Transportation Concerns: No Electronic Signature(s) Signed: 08/23/2022 11:15:04 AM By: CFredirick MaudlinMD FACS Entered By: CFredirick Maudlinon 08/23/2022  08:49:04 -------------------------------------------------------------------------------- SuperBill Details Patient Name: Date of Service: NRaechel Ache10/31/2023 Medical Record Number: [id number] Account Number: [id number] of Birth/Sex: Treating RN: 4Oct 02, 1952(71y.o. M) Primary Care Provider: MAlain HoneyOther Clinician: Referring Provider: Treating Provider/Extender: CNeil Crouchin Treatment: 25 Diagnosis Coding ICD-10 Codes Code Description [phone]-pressure chronic ulcer of other part of right foot with fat layer exposed E11.22 Type 2 diabetes mellitus with diabetic chronic kidney disease J44.9 Chronic obstructive pulmonary disease, unspecified I50.42 Chronic combined systolic (congestive) and diastolic (congestive) heart failure I10 Essential (primary) hypertension N25.81 Secondary hyperparathyroidism of renal origin N18.6 End stage renal disease B20 Human immunodeficiency virus [HIV] disease M86.671 Other chronic osteomyelitis, right ankle and foot Facility Procedures : CPT4 Code: [id number]: [id number]- DEBRIDE WOUND 1ST 20 SQ CM OR < ICD-10 Diagnosis Description L97.512 Non-pressure chronic ulcer of other part of right foot with fat layer exposed Modifier: Quantity: 1 Physician Procedures : CPT4 Code Description Modifier [id number] 32919- WC PHYS LEVEL 4 - EST PT 25 ICD-10 Diagnosis Description L97.512 Non-pressure chronic ulcer of other part of right foot with fat layer exposed B20 Human immunodeficiency virus [HIV] disease I50.42 Chronic  combined systolic (congestive) and diastolic (congestive) heart failure E11.22 Type 2 diabetes mellitus with diabetic chronic kidney disease Quantity: 1 : [id number] 45997- WC PHYS DEBR WO ANESTH 20 SQ CM ICD-10 Diagnosis Description L97.512 Non-pressure chronic ulcer of other part of right foot with fat layer exposed NRobie [name]([id number]  [id number] Page Quantity: 1 14 of 14  Electronic Signature(s) Signed: 08/23/2022 8:59:47 AM By: Fredirick Maudlin MD FACS Entered By: Fredirick Maudlin on 08/23/2022 08:59:47

## 2022-08-23 NOTE — Telephone Encounter (Signed)
Around 12:45 pm patient was complaining to echo tech Bethany before his echocardiogram appointment that he thinks his blood sugar was low. Bethany came to triage to see if we could talk to patient. Romelle Starcher stated she had already give patient a ginger ale and some saltines. Talked to patient and checked his blood glucose. Patient's finger stuck on right hand 3rd digit. Reading showed 215. Informed patient that his blood glucose was elevated, and not low. Patient complained about having to wait for his appointment. Offered patient something to eat or drink, patient refused and stated he was fine.

## 2022-08-24 ENCOUNTER — Other Ambulatory Visit (HOSPITAL_COMMUNITY): Payer: Self-pay

## 2022-08-24 DIAGNOSIS — D631 Anemia in chronic kidney disease: Secondary | ICD-10-CM | POA: Diagnosis not present

## 2022-08-24 DIAGNOSIS — M86671 Other chronic osteomyelitis, right ankle and foot: Secondary | ICD-10-CM | POA: Diagnosis not present

## 2022-08-24 DIAGNOSIS — M868X8 Other osteomyelitis, other site: Secondary | ICD-10-CM | POA: Diagnosis not present

## 2022-08-24 DIAGNOSIS — E11621 Type 2 diabetes mellitus with foot ulcer: Secondary | ICD-10-CM | POA: Diagnosis not present

## 2022-08-24 DIAGNOSIS — L97516 Non-pressure chronic ulcer of other part of right foot with bone involvement without evidence of necrosis: Secondary | ICD-10-CM | POA: Diagnosis not present

## 2022-08-24 DIAGNOSIS — E1169 Type 2 diabetes mellitus with other specified complication: Secondary | ICD-10-CM | POA: Diagnosis not present

## 2022-08-24 DIAGNOSIS — B9561 Methicillin susceptible Staphylococcus aureus infection as the cause of diseases classified elsewhere: Secondary | ICD-10-CM | POA: Diagnosis not present

## 2022-08-24 DIAGNOSIS — N186 End stage renal disease: Secondary | ICD-10-CM | POA: Diagnosis not present

## 2022-08-24 DIAGNOSIS — N2581 Secondary hyperparathyroidism of renal origin: Secondary | ICD-10-CM | POA: Diagnosis not present

## 2022-08-24 DIAGNOSIS — Z992 Dependence on renal dialysis: Secondary | ICD-10-CM | POA: Diagnosis not present

## 2022-08-24 DIAGNOSIS — E782 Mixed hyperlipidemia: Secondary | ICD-10-CM | POA: Diagnosis not present

## 2022-08-24 LAB — GLUCOSE, CAPILLARY: Glucose-Capillary: 127 mg/dL — ABNORMAL HIGH (ref 70–99)

## 2022-08-25 ENCOUNTER — Encounter (HOSPITAL_BASED_OUTPATIENT_CLINIC_OR_DEPARTMENT_OTHER): Payer: Medicare Other | Admitting: General Surgery

## 2022-08-25 ENCOUNTER — Other Ambulatory Visit: Payer: Self-pay | Admitting: *Deleted

## 2022-08-25 ENCOUNTER — Telehealth: Payer: Self-pay | Admitting: Podiatry

## 2022-08-25 MED ORDER — OXYCODONE-ACETAMINOPHEN 5-325 MG PO TABS: 1.0000 | ORAL_TABLET | Freq: Four times a day (QID) | ORAL | 0 refills | Status: DC | PRN

## 2022-08-25 NOTE — Telephone Encounter (Signed)
Called pt and it just rings not able to leave a message on home number. Will try again later

## 2022-08-25 NOTE — Telephone Encounter (Signed)
Called pt and told pt what Dr Loel Lofty said about it sounded normal for the pt to have some nerve pain. He said he will have the nurse re wrap it tomorrow. He said last night he rubbed his leg with some lidocaine and the did help. He has been out to the grocery store today and there has not been the pain like it was. I did tell pt not to take all the wrap off but if needed he could loosen the top wrap if feeling too tight and to call  the office if needs anything else.

## 2022-08-25 NOTE — Telephone Encounter (Signed)
Patient requested refill.  Epic LR: 08/15/2022 Contract Date: 07/20/21 Note added to upcoming appointment to update.  Pended Rx and sent to Dr. Sabra Heck for approval. (No other Provider will sign for this, message sent to Dr. Sabra Heck due to out of office)

## 2022-08-25 NOTE — Telephone Encounter (Signed)
Pt called stated he called the on call provider last night but no one called him back. Pt states the foot that was wrapped yesterday and last night he was woke up by burning sensation like someone was pouring boiling water on the foot. IS this normal for the healing process. He said he did get up and loosen the wrap and rub some antiseptic ointment with lidocaine in it and that helped. But is this normal. It sounds like he did not take the wrap off completely but was asking if he should have the nurse re wrap it completely tomorrow.

## 2022-08-26 DIAGNOSIS — N186 End stage renal disease: Secondary | ICD-10-CM | POA: Diagnosis not present

## 2022-08-26 DIAGNOSIS — D631 Anemia in chronic kidney disease: Secondary | ICD-10-CM | POA: Diagnosis not present

## 2022-08-26 DIAGNOSIS — E1169 Type 2 diabetes mellitus with other specified complication: Secondary | ICD-10-CM | POA: Diagnosis not present

## 2022-08-26 DIAGNOSIS — M868X8 Other osteomyelitis, other site: Secondary | ICD-10-CM | POA: Diagnosis not present

## 2022-08-26 DIAGNOSIS — L97516 Non-pressure chronic ulcer of other part of right foot with bone involvement without evidence of necrosis: Secondary | ICD-10-CM | POA: Diagnosis not present

## 2022-08-26 DIAGNOSIS — B9561 Methicillin susceptible Staphylococcus aureus infection as the cause of diseases classified elsewhere: Secondary | ICD-10-CM | POA: Diagnosis not present

## 2022-08-26 DIAGNOSIS — E11621 Type 2 diabetes mellitus with foot ulcer: Secondary | ICD-10-CM | POA: Diagnosis not present

## 2022-08-26 DIAGNOSIS — Z992 Dependence on renal dialysis: Secondary | ICD-10-CM | POA: Diagnosis not present

## 2022-08-26 DIAGNOSIS — M86671 Other chronic osteomyelitis, right ankle and foot: Secondary | ICD-10-CM | POA: Diagnosis not present

## 2022-08-26 DIAGNOSIS — N2581 Secondary hyperparathyroidism of renal origin: Secondary | ICD-10-CM | POA: Diagnosis not present

## 2022-08-26 DIAGNOSIS — E782 Mixed hyperlipidemia: Secondary | ICD-10-CM | POA: Diagnosis not present

## 2022-08-29 DIAGNOSIS — M868X8 Other osteomyelitis, other site: Secondary | ICD-10-CM | POA: Diagnosis not present

## 2022-08-29 DIAGNOSIS — E1169 Type 2 diabetes mellitus with other specified complication: Secondary | ICD-10-CM | POA: Diagnosis not present

## 2022-08-29 DIAGNOSIS — E11621 Type 2 diabetes mellitus with foot ulcer: Secondary | ICD-10-CM | POA: Diagnosis not present

## 2022-08-29 DIAGNOSIS — Z992 Dependence on renal dialysis: Secondary | ICD-10-CM | POA: Diagnosis not present

## 2022-08-29 DIAGNOSIS — M86671 Other chronic osteomyelitis, right ankle and foot: Secondary | ICD-10-CM | POA: Diagnosis not present

## 2022-08-29 DIAGNOSIS — B9561 Methicillin susceptible Staphylococcus aureus infection as the cause of diseases classified elsewhere: Secondary | ICD-10-CM | POA: Diagnosis not present

## 2022-08-29 DIAGNOSIS — D631 Anemia in chronic kidney disease: Secondary | ICD-10-CM | POA: Diagnosis not present

## 2022-08-29 DIAGNOSIS — E782 Mixed hyperlipidemia: Secondary | ICD-10-CM | POA: Diagnosis not present

## 2022-08-29 DIAGNOSIS — N186 End stage renal disease: Secondary | ICD-10-CM | POA: Diagnosis not present

## 2022-08-29 DIAGNOSIS — N2581 Secondary hyperparathyroidism of renal origin: Secondary | ICD-10-CM | POA: Diagnosis not present

## 2022-08-29 DIAGNOSIS — L97516 Non-pressure chronic ulcer of other part of right foot with bone involvement without evidence of necrosis: Secondary | ICD-10-CM | POA: Diagnosis not present

## 2022-08-31 DIAGNOSIS — L97516 Non-pressure chronic ulcer of other part of right foot with bone involvement without evidence of necrosis: Secondary | ICD-10-CM | POA: Diagnosis not present

## 2022-08-31 DIAGNOSIS — Z992 Dependence on renal dialysis: Secondary | ICD-10-CM | POA: Diagnosis not present

## 2022-08-31 DIAGNOSIS — E11621 Type 2 diabetes mellitus with foot ulcer: Secondary | ICD-10-CM | POA: Diagnosis not present

## 2022-08-31 DIAGNOSIS — E782 Mixed hyperlipidemia: Secondary | ICD-10-CM | POA: Diagnosis not present

## 2022-08-31 DIAGNOSIS — D631 Anemia in chronic kidney disease: Secondary | ICD-10-CM | POA: Diagnosis not present

## 2022-08-31 DIAGNOSIS — N2581 Secondary hyperparathyroidism of renal origin: Secondary | ICD-10-CM | POA: Diagnosis not present

## 2022-08-31 DIAGNOSIS — M86671 Other chronic osteomyelitis, right ankle and foot: Secondary | ICD-10-CM | POA: Diagnosis not present

## 2022-08-31 DIAGNOSIS — N186 End stage renal disease: Secondary | ICD-10-CM | POA: Diagnosis not present

## 2022-08-31 DIAGNOSIS — B9561 Methicillin susceptible Staphylococcus aureus infection as the cause of diseases classified elsewhere: Secondary | ICD-10-CM | POA: Diagnosis not present

## 2022-08-31 DIAGNOSIS — M868X8 Other osteomyelitis, other site: Secondary | ICD-10-CM | POA: Diagnosis not present

## 2022-08-31 DIAGNOSIS — E1169 Type 2 diabetes mellitus with other specified complication: Secondary | ICD-10-CM | POA: Diagnosis not present

## 2022-09-01 ENCOUNTER — Telehealth: Payer: Self-pay | Admitting: Podiatry

## 2022-09-01 ENCOUNTER — Encounter (HOSPITAL_BASED_OUTPATIENT_CLINIC_OR_DEPARTMENT_OTHER): Payer: Medicare Other | Admitting: General Surgery

## 2022-09-01 ENCOUNTER — Ambulatory Visit (INDEPENDENT_AMBULATORY_CARE_PROVIDER_SITE_OTHER): Payer: Medicare Other | Admitting: Podiatry

## 2022-09-01 DIAGNOSIS — M86471 Chronic osteomyelitis with draining sinus, right ankle and foot: Secondary | ICD-10-CM

## 2022-09-01 DIAGNOSIS — E0842 Diabetes mellitus due to underlying condition with diabetic polyneuropathy: Secondary | ICD-10-CM

## 2022-09-01 DIAGNOSIS — L97514 Non-pressure chronic ulcer of other part of right foot with necrosis of bone: Secondary | ICD-10-CM

## 2022-09-01 NOTE — Progress Notes (Signed)
Subjective:  [name], male    DOB: 10-25-1950,  MRN: [id number]  Chief Complaint  Patient presents with   Routine Post Op    POV#3 / DOS 07/20/22 -Irragation and debridement of right foot wound with extraction of all necrotic soft tissue and bone    DOS: July 20, 2022 Procedure: Right foot resection of fourth metatarsal head and base of proximal phalanx of the fourth toe, irrigation and debridement of plantar soft tissue ulceration  71 y.o. male returns for post-op check.  He has been doing well since last visit.  Has been doing every other day dressing changes to the right foot.  He states that he has been seeing the wound care center on a routine basis and has another appointment with them next week.  He says that the wound care nurse said the wound on the bottom of the foot has now healed up enough that there is not able to be any packing applied into the prior cavity.  No drainage from either wound at this point in time.  He has been continuing with Betadine dressings to the top and bottom of the foot.   Review of Systems: Negative except as noted in the HPI. Denies N/V/F/Ch.   Objective:  There were no vitals filed for this visit. There is no height or weight on file to calculate BMI. Constitutional Well developed. Well nourished.  Vascular Foot warm and well perfused. Capillary refill normal to all digits.  Calf is soft and supple, no posterior calf or knee pain, negative Homans' sign  Neurologic Normal speech. Oriented to person, place, and time. Epicritic sensation to light touch grossly present bilaterally.  Dermatologic Skin healing well without signs of infection. Skin edges well coapted without signs of infection. Dorsal foot incision is fully healed with minimal eschar no open wound no drainage no dehiscence no sign of infection. Plantar 4th MPJ ulceration has now almost fully healed in with hyperkeratotic tissue present at site of prior wound.  Not  able to pack any iodoform packing in the area at this point.  No drainage edema erythema or signs of residual infection.        Orthopedic: Tenderness to palpation noted about the surgical site.   Multiple view plain film radiographs: Deferred at this visit Assessment:   1. Chronic osteomyelitis of right foot with draining sinus ([facility])   2. Ulcer of right foot, with necrosis of bone ([place])   3. Diabetes mellitus due to underlying condition with diabetic polyneuropathy, unspecified whether long term insulin use ([place])      Plan:  Patient was evaluated and treated and all questions answered.  S/p foot surgery right fourth MPJ resection with closure of the dorsal incision and irrigation debridement of plantar right foot wound, improved after resection of infected bone wounds dorsal and plantar healing very well -Progressing as expected post-operatively.  No evidence of significant distal dehiscence or complication at the dorsal incision which is now healed.  Plantar wound much improved now unable to be packed with iodoform due to decreased wound depth -XR: Deferred at this visit  -WB Status: Weightbearing as tolerated in regular shoes at this point in time. -Medications: No indication for further IV antibiotics during dialysis after completion of the current course patient says he thinks the last dose may have been yesterday. -Foot redressed.  Applied saline wet to dry gauze.  No packing needed at this time. -Patient will continue to follow-up with wound care center for  at least once weekly dressing changes and wound check -I discussed he is now okay to get the right foot wet and wash with warm soapy water as long as he dries everything off very carefully afterwards. -He should still continue to cover the wounds however he may not need to in the near future as the plantar foot wound appears nearly fully healed then.  I debrided some of the callus tissue present at this visit.  Return in  about 3 weeks (around 09/22/2022) for Follow up right foot ulcer.         Everitt Amber, DPM [facility]

## 2022-09-01 NOTE — Telephone Encounter (Signed)
Pt called asking if he needed to wear a sock over the ace bandage that you put on him when he is to be wearing a regular shoe? He stated you took the thick bandage off and applied the thinner one.

## 2022-09-02 DIAGNOSIS — D631 Anemia in chronic kidney disease: Secondary | ICD-10-CM | POA: Diagnosis not present

## 2022-09-02 DIAGNOSIS — E1169 Type 2 diabetes mellitus with other specified complication: Secondary | ICD-10-CM | POA: Diagnosis not present

## 2022-09-02 DIAGNOSIS — E782 Mixed hyperlipidemia: Secondary | ICD-10-CM | POA: Diagnosis not present

## 2022-09-02 DIAGNOSIS — N2581 Secondary hyperparathyroidism of renal origin: Secondary | ICD-10-CM | POA: Diagnosis not present

## 2022-09-02 DIAGNOSIS — M868X8 Other osteomyelitis, other site: Secondary | ICD-10-CM | POA: Diagnosis not present

## 2022-09-02 DIAGNOSIS — B9561 Methicillin susceptible Staphylococcus aureus infection as the cause of diseases classified elsewhere: Secondary | ICD-10-CM | POA: Diagnosis not present

## 2022-09-02 DIAGNOSIS — L97516 Non-pressure chronic ulcer of other part of right foot with bone involvement without evidence of necrosis: Secondary | ICD-10-CM | POA: Diagnosis not present

## 2022-09-02 DIAGNOSIS — M86671 Other chronic osteomyelitis, right ankle and foot: Secondary | ICD-10-CM | POA: Diagnosis not present

## 2022-09-02 DIAGNOSIS — E11621 Type 2 diabetes mellitus with foot ulcer: Secondary | ICD-10-CM | POA: Diagnosis not present

## 2022-09-02 DIAGNOSIS — N186 End stage renal disease: Secondary | ICD-10-CM | POA: Diagnosis not present

## 2022-09-02 DIAGNOSIS — Z992 Dependence on renal dialysis: Secondary | ICD-10-CM | POA: Diagnosis not present

## 2022-09-02 LAB — BASIC METABOLIC PANEL
BUN: 61 — AB (ref 4–21)
Chloride: 98 — AB (ref 99–108)
Creatinine: 10.3 — AB (ref 0.6–1.3)
Glucose: 250
Potassium: 5.3 mEq/L — AB (ref 3.5–5.1)
Sodium: 140 (ref 137–147)

## 2022-09-02 LAB — CBC AND DIFFERENTIAL
HCT: 32 — AB (ref 41–53)
Hemoglobin: 29.1 — AB (ref 13.5–17.5)
Platelets: 119 10*3/uL — AB (ref 150–400)
WBC: 4.9

## 2022-09-02 LAB — IRON,TIBC AND FERRITIN PANEL
Ferritin: 54
Iron: 68
TIBC: 325
UIBC: 257

## 2022-09-02 LAB — CBC: RBC: 2.87 — AB (ref 3.87–5.11)

## 2022-09-05 DIAGNOSIS — E1169 Type 2 diabetes mellitus with other specified complication: Secondary | ICD-10-CM | POA: Diagnosis not present

## 2022-09-05 DIAGNOSIS — M868X8 Other osteomyelitis, other site: Secondary | ICD-10-CM | POA: Diagnosis not present

## 2022-09-05 DIAGNOSIS — B9561 Methicillin susceptible Staphylococcus aureus infection as the cause of diseases classified elsewhere: Secondary | ICD-10-CM | POA: Diagnosis not present

## 2022-09-05 DIAGNOSIS — M86671 Other chronic osteomyelitis, right ankle and foot: Secondary | ICD-10-CM | POA: Diagnosis not present

## 2022-09-05 DIAGNOSIS — L97516 Non-pressure chronic ulcer of other part of right foot with bone involvement without evidence of necrosis: Secondary | ICD-10-CM | POA: Diagnosis not present

## 2022-09-05 DIAGNOSIS — D631 Anemia in chronic kidney disease: Secondary | ICD-10-CM | POA: Diagnosis not present

## 2022-09-05 DIAGNOSIS — E11621 Type 2 diabetes mellitus with foot ulcer: Secondary | ICD-10-CM | POA: Diagnosis not present

## 2022-09-05 DIAGNOSIS — N186 End stage renal disease: Secondary | ICD-10-CM | POA: Diagnosis not present

## 2022-09-05 DIAGNOSIS — N2581 Secondary hyperparathyroidism of renal origin: Secondary | ICD-10-CM | POA: Diagnosis not present

## 2022-09-05 DIAGNOSIS — E782 Mixed hyperlipidemia: Secondary | ICD-10-CM | POA: Diagnosis not present

## 2022-09-05 DIAGNOSIS — Z992 Dependence on renal dialysis: Secondary | ICD-10-CM | POA: Diagnosis not present

## 2022-09-06 ENCOUNTER — Encounter (HOSPITAL_BASED_OUTPATIENT_CLINIC_OR_DEPARTMENT_OTHER): Payer: Medicare Other | Attending: General Surgery | Admitting: General Surgery

## 2022-09-06 ENCOUNTER — Other Ambulatory Visit (HOSPITAL_COMMUNITY): Payer: Self-pay

## 2022-09-06 DIAGNOSIS — F17210 Nicotine dependence, cigarettes, uncomplicated: Secondary | ICD-10-CM | POA: Insufficient documentation

## 2022-09-06 DIAGNOSIS — I132 Hypertensive heart and chronic kidney disease with heart failure and with stage 5 chronic kidney disease, or end stage renal disease: Secondary | ICD-10-CM | POA: Insufficient documentation

## 2022-09-06 DIAGNOSIS — E1169 Type 2 diabetes mellitus with other specified complication: Secondary | ICD-10-CM | POA: Diagnosis not present

## 2022-09-06 DIAGNOSIS — N186 End stage renal disease: Secondary | ICD-10-CM | POA: Insufficient documentation

## 2022-09-06 DIAGNOSIS — E1122 Type 2 diabetes mellitus with diabetic chronic kidney disease: Secondary | ICD-10-CM | POA: Insufficient documentation

## 2022-09-06 DIAGNOSIS — Z833 Family history of diabetes mellitus: Secondary | ICD-10-CM | POA: Insufficient documentation

## 2022-09-06 DIAGNOSIS — M86671 Other chronic osteomyelitis, right ankle and foot: Secondary | ICD-10-CM | POA: Diagnosis not present

## 2022-09-06 DIAGNOSIS — J449 Chronic obstructive pulmonary disease, unspecified: Secondary | ICD-10-CM | POA: Insufficient documentation

## 2022-09-06 DIAGNOSIS — Z8249 Family history of ischemic heart disease and other diseases of the circulatory system: Secondary | ICD-10-CM | POA: Diagnosis not present

## 2022-09-06 DIAGNOSIS — E11621 Type 2 diabetes mellitus with foot ulcer: Secondary | ICD-10-CM | POA: Diagnosis not present

## 2022-09-06 DIAGNOSIS — I5042 Chronic combined systolic (congestive) and diastolic (congestive) heart failure: Secondary | ICD-10-CM | POA: Diagnosis not present

## 2022-09-06 DIAGNOSIS — N2581 Secondary hyperparathyroidism of renal origin: Secondary | ICD-10-CM | POA: Diagnosis not present

## 2022-09-06 DIAGNOSIS — Z21 Asymptomatic human immunodeficiency virus [HIV] infection status: Secondary | ICD-10-CM | POA: Diagnosis not present

## 2022-09-06 DIAGNOSIS — L97512 Non-pressure chronic ulcer of other part of right foot with fat layer exposed: Secondary | ICD-10-CM | POA: Diagnosis not present

## 2022-09-06 NOTE — Progress Notes (Signed)
GRACEN, SOUTHWELL ([id number]) [id number] Page 1 of 12 Visit Report for 09/06/2022 Chief Complaint Document Details Patient Name: Date of Service: Jacob [name], Jacob [name] 09/06/2022 9:15 A M Medical Record Number: [id number] Patient Account Number: [id number] Date of Birth/Sex: Treating RN: December 23, 1950 (71 y.o. M) Primary Care Provider: Alain Honey Other Clinician: Referring Provider: Treating Provider/Extender: Neil Crouch in Treatment: 27 Information Obtained from: Patient Chief Complaint Patient seen for complaints of Non-Healing Wounds x 3 Electronic Signature(s) Signed: 09/06/2022 9:50:24 AM By: Fredirick Maudlin MD FACS Entered By: Fredirick Maudlin on 09/06/2022 09:50:24 -------------------------------------------------------------------------------- HPI Details Patient Name: Date of Service: Jacob Katos R. 09/06/2022 9:15 A M Medical Record Number: [id number] Patient Account Number: [id number] Date of Birth/Sex: Treating RN: January 30, 1951 (71 y.o. M) Primary Care Provider: Alain Honey Other Clinician: Referring Provider: Treating Provider/Extender: Neil Crouch in Treatment: 27 History of Present Illness Location: 2 ulcers noted to the left hallux Quality: the patient denies pain Duration: these were originally blisters towards the end of February and opened in early March Context: these wounds are secondary to pressure; he got new shoes in February and also started wearing a toe cover/sleeve Modifying Factors: pressure and friction are exacerbating factor HPI Description: 01/12/17- Mr. Petro arrives for initial evaluation of 2 ulcers to his left hallux. He states that back in February he did receive a new pair of diabetic shoes and at that time he started wearing toe cover/sleeves and noticed development of a blister to the dorsal and plantar aspect. He has been seen at [facility] by  both Dr. Amalia Hailey and Dr. Jacqualyn Posey. He last saw Dr.Wagoner on 3/8 at which time he was prescribed Silvadene and Keflex. He completed that dose of Keflex and states that he got a phone call to extend the Keflex prior to this appointment. He has a follow-up with Dr. Amalia Hailey on 3/26. He has been wearing a surgical shoe, no significant offloading to the plantar/medial ulcer but the strap does not extend over the dorsal ulcer. Topically he has been applying the Silvadene daily and a dry dressing. He is a diabetic, on insulin, with a recent A1c of 6.2% (11/10/16). He is a current smoker, 0.5 PPD. He has no known diagnosis of arterial insufficiency. 01/20/17- The patient arrives for follow up evaluation of his ulcers to the left hallux. He admits to continuing to smoke approximately 0.5ppd. He states that his diet has been somewhat uncontrolled with higher than normal glucose levels, this seems circumstantial as he had a death in the family as food and schedule has been disrupted. he admits to wearing the surgical show with offloading modifications while out of the home, but has been ambulating barefoot around the home. he saw Dr. Amalia Hailey yesterday for toenail trimming and callus shaving to the right plantar foot and investigation of his ulcers. 01/26/17- he is here for follow-up evaluation of his left hallux ulcers. He states he has been significantly more compliant in the use of his offloading surgical shoe, with the exception of "getting up for a glass of water" he states that he has worn it with any ambulation. He states he has decreased his smoking too, admitting to only a cigarette since yesterday. He states his blood sugar this morning was 147 and yesterday was 116. He is voicing no complaints or concerns 02/02/17- He is here for follow-up evaluation of his left hallux ulcers. The dorsal ulcer is crusted over/healed. He states he has minimized  his ambulation over this past week, much less than the previous week. He  states his glucose levels have been between 120-150 about half of the time, otherwise greater than 150. He cannot correlate any specific dietary changes. He does have an appointment with a dietitian next month. He was advised to keep a log of his glucose levels and his diet for that appointment. 02/06/17; patient comes in today for first application of the total contact cast. Her intake nurse reported the wounds look better therefore I did not actually see these before application of the cast. He'll return on Thursday for reapplication in the standard fashion 02/10/27- he is here for follow-up evaluation of his left plantar hallux ulcer. He came in Monday for application of a total contact cast. He states that he was tolerating that Monday, but noticed on Tuesday that his foot began to slide in the cast. He also is complaining of the heaviness of the cast, as he ambulates MEHRAN, GUDERIAN ([id number]) [id number] Page 2 of 12 as his primary form of transportation. He continues to smoke,. He has not contacted his cardiologist regarding Nicorette gum. He states his blood sugars have been slightly elevated, this morning being 206. He states that he will complete an antibiotic tonight. He states this was prescribed by Dr. Jacqualyn Posey. He states that he has refilled this prescription twice, without direction to continue taking this medication. 02/16/17; patient wound about the same as last week per her description. Rolled edges of callus scan and nonviable subcutaneous tissue. Using Prisma. Apparently used topical antibiotic instead of the Prisma over the course of this week. He has not a total contact cast candidate secondary to ambulation needs and unsteadiness on his feet per discussion with our intake nurse 03/02/17 patient requires debridement. He has a small wound on the right great toe which probes however does not approach bone. What I can see of the base of this wound looks  reasonably healthy and I think this is largely in offloading issue with the second toe crossing over the first. He did not tolerate a total contact cast has balance issues 5/171/8. 0.2x0.2x0.2 less depth. Using prisma 03/16/17; no major change. Still a small probing wound with copious amounts of surrounding callus skin nonviable subcutaneous tissue all of which debrided. Still using Prisma. I suspect the dimensions post debridement are about the same as listed above 0.2 x 0.2 x 0.2 03/23/17; no major change using Prisma. Once again a large amount of callus nonviable subcutaneous tissue around the wound. I went over the offloading issues with the patient and the nurse. He did not tolerate a total contact cast he uses public transportation among other issues. 03/30/17; no major change using Prisma. Once again a large amount of callus and nonviable tissue around the wound requires debridement. Saw Dr. Amalia Hailey who pared calluses on the other foot. 04/06/17; the wound is much smaller there is callus around this although I think I can see the base of the wound and I elected not to do any debridement. 04/13/17 this is a wound I did not debridement last week left callus intact. He arrives today with the area much in the same predicament however this time I remove the callus to find a deep probing hole. This is disappointing this is not go to bone. We have been using Prisma. 04/20/17 plantar left first toe. Again although a lot of circumferential callus around the wound which requires debridement however this does not appear to be a probing area.  Culture I did last week show coag-negative staph. I thought this was probably not something that was worth treating but In the interim he was seen by his primary care with parotitis. He is now on clindamycin and Cipro and the Staphylococcus we cultured is Cipro sensitive [lugdunensis} 04/27/17; plantar left first toe. Again a lot of circumferential callus around a small open  area. Removing the callus also nonviable subcutaneous tissue. The wound looks better today. He has completed his antibiotics for the parotitis. I didn't think additional antibiotics were necessary in terms of the wound 05/11/17; plantar first toe. I thought this might be closed this week however once again he has a deep probing small hole. Using pickups and a scalpel debridement of thick callus and subcutaneous tissue from around the wound reveals a depth of about 0.4 cm. We have not been able to offload the area. There is no obvious infection here. No evidence of ischemia 05/18/17; since his last visit he's been in hospital for oral surgery apparently to remove a retained surgical screw. He has swelling of his jaw and has some discomfort. He arrives today with the plantar left first toe in the same predicament. No major change. Each time he comes in here he has thick adherent callus 05/25/17; arrives today with roughly the same condition of the plantar left great toe. Perhaps somewhat better less callus. There are no options for offloading. He is on his feet a lot and I think this is largely a pressure issue X-ray I did last week showed no evidence of osteomyelitis. He has a toe separator as the great toe tends to ride under the second toe when he is walking. I'm hopeful that this will allow for a little less pressure on the plantar aspect of the left great toe 06/07/2017 -- he is working hard on giving up smoking and other than that has been wearing his diabetic shoes and is also trying to offload as much as possible. 06/15/17; patient arrived in clinic today with a difficult area on his left medial great toe closed over. There is still amount of callus on this however I don't believe there is any underlying open wound. He has custom shoes with inserts. I still think he is going to need to offload this area going forward. He also follows with Dr. Amalia Hailey of podiatry for routine foot  care READMISSION 11/01/16; patient arrives in clinic today with a five-day history of a new area on the previously problematic left great toe. He is wearing his diabetic shoes and he apparently has a new pair coming. The patient is a type II diabetic with neuropathy. He follows with Dr. Amalia Hailey of podiatry for pre-ulcerative callus on his bilateral feet last seen on 08/24/17. He has developed a large blister on top of his left great toe this is since gone down somewhat. He is not been dressing this specifically. He does not have a history of significant PAD previous ABIs in this clinic were1.1 11/06/17; left great toe has epithelialized since last time. I think this was caused by a insoles an issue pushing the toe against the dorsal aspect of the shoe itself has new diabetic shoes coming hopefully will have more forefoot depth. READMISSION 03/01/2022 This is a now 71 year old man with a past medical history significant for type 2 diabetes mellitus with end-stage renal disease on hemodialysis as as well as congestive heart failure, ischemic cardiomyopathy, hypertension, and prior diabetic foot ulcers. He has had multiple issues with his hemodialysis  access. He had a prior left radiocephalic fistula that failed. In October 2020, he had a left brachiobasilic fistula. He apparently underwent a procedure at an outside vascular office on November 09, 2021. Subsequent to that, he developed a wound on his left posterior forearm just distal to the elbow as well as wounds on 2 of his left digits. He was diagnosed with steal syndrome and recently underwent ligation of his fistula with placement of a hemodialysis catheter. He is here today for assistance in healing the wounds that developed related to steal syndrome. He says that he has been applying Neosporin to the sites and they are fairly sensitive to the touch. 03/08/2022: The wounds on his fingers are much smaller and have just a little bit of accumulated thin  eschar. The wound on his left posterior forearm is also smaller but has accumulated both slough and eschar. He also has a thick callus on his right fifth metatarsal head plantar surface and unfortunately, it appears that there is an open wound underneath the callus. We have been using Iodosorb with Hydrofera Blue to all of his open wounds. Pain is much improved from last week. 03/15/2022: The wounds on his fingers have healed. The wound on his left posterior forearm is smaller and has a nice layer of granulation tissue with just a bit of accumulated slough. He has built up additional callus around the right fifth metatarsal head plantar surface, but the underlying wound has a good surface with granulation tissue present. We have been using Iodosorb with Hydrofera Blue. He did not endorse any pain this week. 03/22/2022: The wound on his left posterior forearm continues to contract. There is good granulation tissue present and just a little bit of periwound eschar and overlying slough. The right fifth metatarsal head wound has once again accumulated some callus and there is slough within the wound. The intake nurse measured a area of undermining from 7-10 o'clock. 03/29/2022: The patient saw podiatry this week for nail trim and they also pared back a number of calluses. The wound on his left posterior forearm is substantially smaller with just some overlying thin eschar. The right fifth metatarsal head has a bit of callus accumulation as well as some slough in the wound bed but is otherwise clean. It is smaller today, as well. 04/05/2022: The wound on his left posterior forearm is nearly closed with just a small amount of overlying eschar. The right fifth metatarsal head wound is smaller again this week. There is some periwound callus accumulation and some slough in the wound bed. He is going to get new dialysis access placed next week. 04/19/2022: The wound on his left posterior forearm is closed. The right  fifth metatarsal head wound is about the same size with periwound callus and slough accumulation. He did have a new right forearm PTFE AV graft placed last week. His right forearm and hand are quite swollen, warm, and red today. He was apparently seen in the emergency room yesterday and they did not think it was infected and attributed the swelling to normal postop findings. 04/28/2022: The wound on his foot has accumulated a lot of periwound callus, but the actual open surface is quite small. There is just a little bit of slough on the surface. 05/03/2022: His wound is even smaller today with less periwound callus accumulation. He continues to build up slough but the underlying tissue is healthy and viable-appearing. CLOIS, TREANOR ([id number]) [id number] Page 3 of 12 05/10/2022: The orifice of the  wound continues to contract, but there is some undermining and periwound callus formation. No significant accumulation of slough. 05/17/2022: The wound is about the same size today, perhaps slightly smaller. He does have periwound callus accumulation, but it does not overlap or create any undermining. There is some slough in the base of the wound. 05/31/2022: The wound measured larger today on intake, but this may be secondary to the fairly aggressive debridement I performed at his last visit. Despite this, he has built up callus around the wound again. There is some epiboly around the orifice. There is slough within the wound and some undermining is present. His blood sugar remains very poorly controlled; it was 300 today. 06/16/2022: No real change in the size of his wound. He has callus accumulation around the perimeter. Last visit we changed his dressing to Prisma; I have not seen much in the way of filling in from the base yet. 06/30/2022: No real change to his wound. There is some undermining that has been created by accumulated callus. We are struggling with poor glucose  control in the setting of immunodeficiency and end-stage renal disease on dialysis. 07/14/2022: The wound has gotten deeper and a probe placed into the wound can be palpated coming to just under the skin on the dorsal aspect of his foot. The wound itself is fairly clean and there is no malodor or purulent drainage present. He is scheduled to see podiatry next week and hopefully get x-rays performed at that visit. He is also requesting a knee scooter. 07/26/2022: A couple of days after our last visit, he presented to the emergency department with worsening of his wound. Radiographic imaging was consistent with osteomyelitis. He was taken to the operating room by Dr. Loel Lofty of podiatry. He underwent irrigation and debridement of his right foot wound and resection of the right fourth metatarsal head and proximal phalanx. Cultures grew out staph. He is currently receiving IV vancomycin and ceftazidime with hemodialysis. The wound is very clean today. There is no slough buildup. No purulent drainage. It does probe through to the dorsum of his foot to his surgical incision. Sutures are still in place. 08/04/2022: The surgical incision has partially dehisced. There are still Prolene sutures in situ. There is some slough accumulation on the open surface behind the sutures. On the plantar surface, the wound has accumulated a thick periwound callus. There is some undermining present. 10/19; surgical wound plantar fourth met head right foot. He has had a ray resection apparently. The wound probes through to his dorsal foot. In spite of this the patient appears well. He is still on antibiotics at dialysis apparently vancomycin and ceftriaxone. We are packing this with iodoform 08/23/2022: He saw Dr. Loel Lofty last week and had his sutures removed. The dorsal foot wound has just a little bit of crust and eschar on it, but no sign of dehiscence. The plantar wound depth has come in considerably. It is clean with  just periwound callus accumulation. 09/06/2022: His wound is healed. Electronic Signature(s) Signed: 09/06/2022 9:51:32 AM By: Fredirick Maudlin MD FACS Entered By: Fredirick Maudlin on 09/06/2022 09:51:32 -------------------------------------------------------------------------------- Physical Exam Details Patient Name: Date of Service: Jacob [name]. 09/06/2022 9:15 A M Medical Record Number: [id number] Patient Account Number: [id number] Date of Birth/Sex: Treating RN: 14-Nov-1950 (71 y.o. M) Primary Care Provider: Alain Honey Other Clinician: Referring Provider: Treating Provider/Extender: Neil Crouch in Treatment: 27 Constitutional . . . . No acute distress.[name] Respiratory Normal work of  breathing on room air.. Notes 09/06/2022: His wound is healed. Electronic Signature(s) Signed: 09/06/2022 9:52:45 AM By: Fredirick Maudlin MD FACS Entered By: Fredirick Maudlin on 09/06/2022 09:52:45 [name] ([id number]) [id number] Page 4 of 12 -------------------------------------------------------------------------------- Physician Orders Details Patient Name: Date of Service: PLACIDO, HANGARTNER 09/06/2022 9:15 A M Medical Record Number: [id number] Patient Account Number: [id number] Date of Birth/Sex: Treating RN: 16-Dec-1950 (71 y.o. Jacob [name] Primary Care Provider: Alain Honey Other Clinician: Referring Provider: Treating Provider/Extender: Neil Crouch in Treatment: 27 Verbal / Phone Orders: No Diagnosis Coding ICD-10 Coding Code Description [phone] Non-pressure chronic ulcer of other part of right foot with fat layer exposed E11.22 Type 2 diabetes mellitus with diabetic chronic kidney disease J44.9 Chronic obstructive pulmonary disease, unspecified I50.42 Chronic combined systolic (congestive) and diastolic (congestive) heart failure I10 Essential (primary)  hypertension N25.81 Secondary hyperparathyroidism of renal origin N18.6 End stage renal disease B20 Human immunodeficiency virus [HIV] disease M86.671 Other chronic osteomyelitis, right ankle and foot Discharge From [facility] Discharge from [place]!!!!!!!!!!! [facility] for [facility] care. Other Home Health Orders/Instructions: - Surveyor, minerals) Signed: 09/06/2022 9:52:55 AM By: Fredirick Maudlin MD FACS Entered By: Fredirick Maudlin on 09/06/2022 09:52:54 -------------------------------------------------------------------------------- Problem List Details Patient Name: Date of Service: Jacob [name]. 09/06/2022 9:15 A M Medical Record Number: [id number] Patient Account Number: [id number] Date of Birth/Sex: Treating RN: 03-24-1951 (71 y.o. M) Primary Care Provider: Alain Honey Other Clinician: Referring Provider: Treating Provider/Extender: Neil Crouch in Treatment: 27 Active Problems ICD-10 Encounter Code Description Active Date MDM Diagnosis L97.512 Non-pressure chronic ulcer of other part of right foot with fat layer exposed 03/08/2022 No Yes E11.22 Type 2 diabetes mellitus with diabetic chronic kidney disease 03/01/2022 No Yes J44.9 Chronic obstructive pulmonary disease, unspecified 03/01/2022 No Yes I50.42 Chronic combined systolic (congestive) and diastolic (congestive) heart failure 03/01/2022 No Yes DEONDRAY, OSPINA ([id number]) [id number] Page 5 of 12 I10 Essential (primary) hypertension 03/01/2022 No Yes N25.81 Secondary hyperparathyroidism of renal origin 03/01/2022 No Yes N18.6 End stage renal disease 03/01/2022 No Yes B20 Human immunodeficiency virus [HIV] disease 03/01/2022 No Yes M86.671 Other chronic osteomyelitis, right ankle and foot 07/26/2022 No Yes Inactive Problems Resolved Problems ICD-10 Code Description Active Date Resolved Date L98.499  Non-pressure chronic ulcer of skin of other sites with unspecified severity 03/01/2022 03/01/2022 Electronic Signature(s) Signed: 09/06/2022 9:50:07 AM By: Fredirick Maudlin MD FACS Entered By: Fredirick Maudlin on 09/06/2022 09:50:06 -------------------------------------------------------------------------------- Progress Note Details Patient Name: Date of Service: Jacob [name]. 09/06/2022 9:15 A M Medical Record Number: [id number] Patient Account Number: [id number] Date of Birth/Sex: Treating RN: [birth year] (71 y.o. M) Primary Care Provider: Alain Honey Other Clinician: Referring Provider: Treating Provider/Extender: Neil Crouch in Treatment: 27 Subjective Chief Complaint Information obtained from Patient Patient seen for complaints of Non-Healing Wounds x 3 History of Present Illness (HPI) The following HPI elements were documented for the patient's wound: Location: 2 ulcers noted to the left hallux Quality: the patient denies pain Duration: these were originally blisters towards the end of February and opened in early March Context: these wounds are secondary to pressure; he got new shoes in February and also started wearing a toe cover/sleeve Modifying Factors: pressure and friction are exacerbating factor 01/12/17- Mr. Lyles arrives for initial evaluation of 2 ulcers to his left hallux. He states that back in February he did receive a new pair  of diabetic shoes and at that time he started wearing toe cover/sleeves and noticed development of a blister to the dorsal and plantar aspect. He has been seen at [facility] by both Dr. Amalia Hailey and Dr. Jacqualyn Posey. He last saw Dr.Wagoner on 3/8 at which time he was prescribed Silvadene and Keflex. He completed that dose of Keflex and states that he got a phone call to extend the Keflex prior to this appointment. He has a follow-up with Dr. Amalia Hailey on 3/26. He has been wearing a surgical shoe, no significant  offloading to the plantar/medial ulcer but the strap does not extend over the dorsal ulcer. T opically he has been applying the Silvadene daily and a dry dressing. He is a diabetic, on insulin, with a recent A1c of 6.2% (11/10/16). He is a current smoker, 0.5 PPD. He has no known diagnosis of arterial insufficiency. 01/20/17- The patient arrives for follow up evaluation of his ulcers to the left hallux. He admits to continuing to smoke approximately 0.5ppd. He states that his RAMIN, ZOLL ([id number]) [id number] Page 6 of 12 diet has been somewhat uncontrolled with higher than normal glucose levels, this seems circumstantial as he had a death in the family as food and schedule has been disrupted. he admits to wearing the surgical show with offloading modifications while out of the home, but has been ambulating barefoot around the home. he saw Dr. Amalia Hailey yesterday for toenail trimming and callus shaving to the right plantar foot and investigation of his ulcers. 01/26/17- he is here for follow-up evaluation of his left hallux ulcers. He states he has been significantly more compliant in the use of his offloading surgical shoe, with the exception of "getting up for a glass of water" he states that he has worn it with any ambulation. He states he has decreased his smoking too, admitting to only a cigarette since yesterday. He states his blood sugar this morning was 147 and yesterday was 116. He is voicing no complaints or concerns 02/02/17- He is here for follow-up evaluation of his left hallux ulcers. The dorsal ulcer is crusted over/healed. He states he has minimized his ambulation over this past week, much less than the previous week. He states his glucose levels have been between 120-150 about half of the time, otherwise greater than 150. He cannot correlate any specific dietary changes. He does have an appointment with a dietitian next month. He was advised to keep a log of  his glucose levels and his diet for that appointment. 02/06/17; patient comes in today for first application of the total contact cast. Her intake nurse reported the wounds look better therefore I did not actually see these before application of the cast. He'll return on Thursday for reapplication in the standard fashion 02/10/27- he is here for follow-up evaluation of his left plantar hallux ulcer. He came in Monday for application of a total contact cast. He states that he was tolerating that Monday, but noticed on Tuesday that his foot began to slide in the cast. He also is complaining of the heaviness of the cast, as he ambulates as his primary form of transportation. He continues to smoke,. He has not contacted his cardiologist regarding Nicorette gum. He states his blood sugars have been slightly elevated, this morning being 206. He states that he will complete an antibiotic tonight. He states this was prescribed by Dr. Jacqualyn Posey. He states that he has refilled this prescription twice, without direction to continue taking this medication. 02/16/17; patient  wound about the same as last week per her description. Rolled edges of callus scan and nonviable subcutaneous tissue. Using Prisma. Apparently used topical antibiotic instead of the Prisma over the course of this week. He has not a total contact cast candidate secondary to ambulation needs and unsteadiness on his feet per discussion with our intake nurse 03/02/17 patient requires debridement. He has a small wound on the right great toe which probes however does not approach bone. What I can see of the base of this wound looks reasonably healthy and I think this is largely in offloading issue with the second toe crossing over the first. He did not tolerate a total contact cast has balance issues 5/171/8. 0.2x0.2x0.2 less depth. Using prisma 03/16/17; no major change. Still a small probing wound with copious amounts of surrounding callus skin  nonviable subcutaneous tissue all of which debrided. Still using Prisma. I suspect the dimensions post debridement are about the same as listed above 0.2 x 0.2 x 0.2 03/23/17; no major change using Prisma. Once again a large amount of callus nonviable subcutaneous tissue around the wound. I went over the offloading issues with the patient and the nurse. He did not tolerate a total contact cast he uses public transportation among other issues. 03/30/17; no major change using Prisma. Once again a large amount of callus and nonviable tissue around the wound requires debridement. Saw Dr. Amalia Hailey who pared calluses on the other foot. 04/06/17; the wound is much smaller there is callus around this although I think I can see the base of the wound and I elected not to do any debridement. 04/13/17 this is a wound I did not debridement last week left callus intact. He arrives today with the area much in the same predicament however this time I remove the callus to find a deep probing hole. This is disappointing this is not go to bone. We have been using Prisma. 04/20/17 plantar left first toe. Again although a lot of circumferential callus around the wound which requires debridement however this does not appear to be a probing area. Culture I did last week show coag-negative staph. I thought this was probably not something that was worth treating but In the interim he was seen by his primary care with parotitis. He is now on clindamycin and Cipro and the Staphylococcus we cultured is Cipro sensitive [lugdunensis} 04/27/17; plantar left first toe. Again a lot of circumferential callus around a small open area. Removing the callus also nonviable subcutaneous tissue. The wound looks better today. He has completed his antibiotics for the parotitis. I didn't think additional antibiotics were necessary in terms of the wound 05/11/17; plantar first toe. I thought this might be closed this week however once again he has a deep  probing small hole. Using pickups and a scalpel debridement of thick callus and subcutaneous tissue from around the wound reveals a depth of about 0.4 cm. We have not been able to offload the area. There is no obvious infection here. No evidence of ischemia 05/18/17; since his last visit he's been in hospital for oral surgery apparently to remove a retained surgical screw. He has swelling of his jaw and has some discomfort. He arrives today with the plantar left first toe in the same predicament. No major change. Each time he comes in here he has thick adherent callus 05/25/17; arrives today with roughly the same condition of the plantar left great toe. Perhaps somewhat better less callus. There are no options for offloading.  He is on his feet a lot and I think this is largely a pressure issue X-ray I did last week showed no evidence of osteomyelitis. He has a toe separator as the great toe tends to ride under the second toe when he is walking. I'm hopeful that this will allow for a little less pressure on the plantar aspect of the left great toe 06/07/2017 -- he is working hard on giving up smoking and other than that has been wearing his diabetic shoes and is also trying to offload as much as possible. 06/15/17; patient arrived in clinic today with a difficult area on his left medial great toe closed over. There is still amount of callus on this however I don't believe there is any underlying open wound. He has custom shoes with inserts. I still think he is going to need to offload this area going forward. He also follows with Dr. Amalia Hailey of podiatry for routine foot care READMISSION 11/01/16; patient arrives in clinic today with a five-day history of a new area on the previously problematic left great toe. He is wearing his diabetic shoes and he apparently has a new pair coming. The patient is a type II diabetic with neuropathy. He follows with Dr. Amalia Hailey of podiatry for pre-ulcerative callus on  his bilateral feet last seen on 08/24/17. He has developed a large blister on top of his left great toe this is since gone down somewhat. He is not been dressing this specifically. He does not have a history of significant PAD previous ABIs in this clinic were1.1 11/06/17; left great toe has epithelialized since last time. I think this was caused by a insoles an issue pushing the toe against the dorsal aspect of the shoe itself has new diabetic shoes coming hopefully will have more forefoot depth. READMISSION 03/01/2022 This is a now 71 year old man with a past medical history significant for type 2 diabetes mellitus with end-stage renal disease on hemodialysis as as well as congestive heart failure, ischemic cardiomyopathy, hypertension, and prior diabetic foot ulcers. He has had multiple issues with his hemodialysis access. He had a prior left radiocephalic fistula that failed. In October 2020, he had a left brachiobasilic fistula. He apparently underwent a procedure at an outside vascular office on November 09, 2021. Subsequent to that, he developed a wound on his left posterior forearm just distal to the elbow as well as wounds on 2 of his left digits. He was diagnosed with steal syndrome and recently underwent ligation of his fistula with placement of a hemodialysis catheter. He is here today for assistance in healing the wounds that developed related to steal syndrome. He says that he has been applying Neosporin to the sites and they are fairly sensitive to the touch. 03/08/2022: The wounds on his fingers are much smaller and have just a little bit of accumulated thin eschar. The wound on his left posterior forearm is also smaller but has accumulated both slough and eschar. He also has a thick callus on his right fifth metatarsal head plantar surface and unfortunately, it appears that there is an open wound underneath the callus. We have been using Iodosorb with Hydrofera Blue to all of his open  wounds. Pain is much improved from last week. 03/15/2022: The wounds on his fingers have healed. The wound on his left posterior forearm is smaller and has a nice layer of granulation tissue with just a bit of accumulated slough. He has built up additional callus around the right fifth metatarsal  head plantar surface, but the underlying wound has a good surface with granulation tissue present. We have been using Iodosorb with Hydrofera Blue. He did not endorse any pain this week. 03/22/2022: The wound on his left posterior forearm continues to contract. There is good granulation tissue present and just a little bit of periwound eschar and overlying slough. The right fifth metatarsal head wound has once again accumulated some callus and there is slough within the wound. The intake nurse measured a area of undermining from 7-10 o'clock. ABHAY, GODBOLT ([id number]) [id number] Page 7 of 12 03/29/2022: The patient saw podiatry this week for nail trim and they also pared back a number of calluses. The wound on his left posterior forearm is substantially smaller with just some overlying thin eschar. The right fifth metatarsal head has a bit of callus accumulation as well as some slough in the wound bed but is otherwise clean. It is smaller today, as well. 04/05/2022: The wound on his left posterior forearm is nearly closed with just a small amount of overlying eschar. The right fifth metatarsal head wound is smaller again this week. There is some periwound callus accumulation and some slough in the wound bed. He is going to get new dialysis access placed next week. 04/19/2022: The wound on his left posterior forearm is closed. The right fifth metatarsal head wound is about the same size with periwound callus and slough accumulation. He did have a new right forearm PTFE AV graft placed last week. His right forearm and hand are quite swollen, warm, and red today. He was apparently  seen in the emergency room yesterday and they did not think it was infected and attributed the swelling to normal postop findings. 04/28/2022: The wound on his foot has accumulated a lot of periwound callus, but the actual open surface is quite small. There is just a little bit of slough on the surface. 05/03/2022: His wound is even smaller today with less periwound callus accumulation. He continues to build up slough but the underlying tissue is healthy and viable-appearing. 05/10/2022: The orifice of the wound continues to contract, but there is some undermining and periwound callus formation. No significant accumulation of slough. 05/17/2022: The wound is about the same size today, perhaps slightly smaller. He does have periwound callus accumulation, but it does not overlap or create any undermining. There is some slough in the base of the wound. 05/31/2022: The wound measured larger today on intake, but this may be secondary to the fairly aggressive debridement I performed at his last visit. Despite this, he has built up callus around the wound again. There is some epiboly around the orifice. There is slough within the wound and some undermining is present. His blood sugar remains very poorly controlled; it was 300 today. 06/16/2022: No real change in the size of his wound. He has callus accumulation around the perimeter. Last visit we changed his dressing to Prisma; I have not seen much in the way of filling in from the base yet. 06/30/2022: No real change to his wound. There is some undermining that has been created by accumulated callus. We are struggling with poor glucose control in the setting of immunodeficiency and end-stage renal disease on dialysis. 07/14/2022: The wound has gotten deeper and a probe placed into the wound can be palpated coming to just under the skin on the dorsal aspect of his foot. The wound itself is fairly clean and there is no malodor or purulent drainage present. He is  scheduled to see podiatry next week and hopefully get x-rays performed at that visit. He is also requesting a knee scooter. 07/26/2022: A couple of days after our last visit, he presented to the emergency department with worsening of his wound. Radiographic imaging was consistent with osteomyelitis. He was taken to the operating room by Dr. Loel Lofty of podiatry. He underwent irrigation and debridement of his right foot wound and resection of the right fourth metatarsal head and proximal phalanx. Cultures grew out staph. He is currently receiving IV vancomycin and ceftazidime with hemodialysis. The wound is very clean today. There is no slough buildup. No purulent drainage. It does probe through to the dorsum of his foot to his surgical incision. Sutures are still in place. 08/04/2022: The surgical incision has partially dehisced. There are still Prolene sutures in situ. There is some slough accumulation on the open surface behind the sutures. On the plantar surface, the wound has accumulated a thick periwound callus. There is some undermining present. 10/19; surgical wound plantar fourth met head right foot. He has had a ray resection apparently. The wound probes through to his dorsal foot. In spite of this the patient appears well. He is still on antibiotics at dialysis apparently vancomycin and ceftriaxone. We are packing this with iodoform 08/23/2022: He saw Dr. Loel Lofty last week and had his sutures removed. The dorsal foot wound has just a little bit of crust and eschar on it, but no sign of dehiscence. The plantar wound depth has come in considerably. It is clean with just periwound callus accumulation. 09/06/2022: His wound is healed. Patient History Information obtained from Patient. Family History Diabetes - Siblings,Father, Heart Disease - Father,Siblings, Hypertension - Father,Siblings, No family history of Cancer, Hereditary Spherocytosis, Kidney Disease, Lung Disease, Seizures,  Stroke, Thyroid Problems, Tuberculosis. Social History Current every day smoker - 1/2 ppd / 35yr, Marital Status - Single, Alcohol Use - Moderate - 6 standard drinks per week, Drug Use - Prior History - Polysubstance Abuse; clean for 9 years, Caffeine Use - Rarely - tea. Medical History Eyes Patient has history of Cataracts - mild, Glaucoma Denies history of Optic Neuritis Ear/Nose/Mouth/Throat Denies history of Chronic sinus problems/congestion, Middle ear problems Hematologic/Lymphatic Patient has history of Anemia Denies history of Hemophilia, Human Immunodeficiency Virus, Lymphedema, Sickle Cell Disease Respiratory Patient has history of Chronic Obstructive Pulmonary Disease (COPD) Denies history of Aspiration, Asthma, Pneumothorax, Sleep Apnea, Tuberculosis Cardiovascular Patient has history of Congestive Heart Failure, Coronary Artery Disease, Hypertension, Myocardial Infarction - 2004 Denies history of Angina, Arrhythmia, Deep Vein Thrombosis, Hypotension, Peripheral Arterial Disease, Peripheral Venous Disease, Phlebitis, Vasculitis Gastrointestinal Patient has history of Hepatitis C - 1968 Denies history of Cirrhosis , Colitis, Crohnoos, Hepatitis A, Hepatitis B Endocrine Patient has history of Type II Diabetes Denies history of Type I Diabetes Genitourinary Denies history of End Stage Renal Disease Immunological Denies history of Lupus Erythematosus, Raynaudoos, Scleroderma NAYIDEN, MILLIMAN([id number] Page 8 of 12 Integumentary (Skin) Denies history of History of Burn Musculoskeletal Patient has history of Gout, Osteoarthritis Denies history of Rheumatoid Arthritis, Osteomyelitis Neurologic Patient has history of Neuropathy Denies history of Dementia, Quadriplegia, Paraplegia, Seizure Disorder Oncologic Denies history of Received Chemotherapy, Received Radiation Psychiatric Denies history of Anorexia/bulimia, Confinement  Anxiety Hospitalization/Surgery History - Colonoscopy. - CABG. - Laproscopic Cholecystectomy. - Left Hip Fx. - Left Jaw Fx. - Lumbar Laminectomy. Medical A Surgical History Notes nd Constitutional Symptoms (General Health) Insomnia Syncope Alcohol Dependence HIV Hematologic/Lymphatic Hyperlipidemia Hyperglycemia Cardiovascular CABG, ischemic cardiomyopathy Gastrointestinal GERD Genitourinary  Syphilis CKD,Nocturia Immunological HIV Musculoskeletal Hip Fx BiLateral Bunions Unspecified Arthritis Neurologic chronic back pain Objective Constitutional No acute distress.. Vitals Time Taken: 9:33 AM, Height: 74 in, Weight: 172 lbs, BMI: 22.1, Temperature: 97.9 F, Pulse: 76 bpm, Respiratory Rate: 18 breaths/min, Blood Pressure: 109/56 mmHg. Respiratory Normal work of breathing on room air.. General Notes: 09/06/2022: His wound is healed. Integumentary (Hair, Skin) Wound #8 status is Open. Original cause of wound was Blister. The date acquired was: 03/08/2022. The wound has been in treatment 26 weeks. The wound is located on the [place]. The wound measures 0cm length x 0cm width x 0cm depth; 0cm^2 area and 0cm^3 volume. There is no tunneling or undermining noted. There is a none present amount of drainage noted. The wound margin is thickened. There is no granulation within the wound bed. There is no necrotic tissue within the wound bed. The periwound skin appearance had no abnormalities noted for moisture. The periwound skin appearance had no abnormalities noted for color. The periwound skin appearance exhibited: Callus. Periwound temperature was noted as No Abnormality. The periwound has tenderness on palpation. 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 Chronic obstructive pulmonary disease, unspecified Chronic combined systolic (congestive)  and diastolic (congestive) heart failure Essential (primary) hypertension Secondary hyperparathyroidism of renal origin End stage renal disease Human immunodeficiency virus [HIV] disease Other chronic osteomyelitis, right ankle and foot TORRYN, HUDSPETH ([id number]) [id number] Page 9 of 12 Plan Discharge From [facility] Services: Discharge from [place]!!!!!!!!!!! Home Health: [facility] for [facility] care. Other Home Health Orders/Instructions: - [facility] 09/06/2022: His wound is healed. He is wearing new proper diabetic shoes and new diabetic socks and says that he has several additional pairs at home. He was encouraged to do daily foot inspections and moisturize his feet in accordance with appropriate diabetic foot care. We will discharge him from the wound care center. He may follow-up as needed. Electronic Signature(s) Signed: 09/06/2022 9:54:06 AM By: Fredirick Maudlin MD FACS Entered By: Fredirick Maudlin on 09/06/2022 09:54:06 -------------------------------------------------------------------------------- HxROS Details Patient Name: Date of Service: Jacob Katos R. 09/06/2022 9:15 A M Medical Record Number: [id number] Patient Account Number: [id number] Date of Birth/Sex: Treating RN: 10-27-1950 (71 y.o. M) Primary Care Provider: Alain Honey Other Clinician: Referring Provider: Treating Provider/Extender: Neil Crouch in Treatment: 27 Information Obtained From Patient Constitutional Symptoms (General Health) Medical History: Past Medical History Notes: Insomnia Syncope Alcohol Dependence HIV Eyes Medical History: Positive for: Cataracts - mild; Glaucoma Negative for: Optic Neuritis Ear/Nose/Mouth/Throat Medical History: Negative for: Chronic sinus problems/congestion; Middle ear problems Hematologic/Lymphatic Medical History: Positive for: Anemia Negative for: Hemophilia;  Human Immunodeficiency Virus; Lymphedema; Sickle Cell Disease Past Medical History Notes: Hyperlipidemia Hyperglycemia Respiratory Medical History: Positive for: Chronic Obstructive Pulmonary Disease (COPD) Negative for: Aspiration; Asthma; Pneumothorax; Sleep Apnea; Tuberculosis Cardiovascular Medical History: Positive for: Congestive Heart Failure; Coronary Artery Disease; Hypertension; Myocardial Infarction - 2004 Negative for: Angina; Arrhythmia; Deep Vein Thrombosis; Hypotension; Peripheral Arterial Disease; Peripheral Venous Disease; Phlebitis; Vasculitis JAYLEND, REILAND ([id number]) [id number] Page 10 of 12 Past Medical History Notes: CABG, ischemic cardiomyopathy Gastrointestinal Medical History: Positive for: Hepatitis C - 1968 Negative for: Cirrhosis ; Colitis; Crohns; Hepatitis A; Hepatitis B Past Medical History Notes: GERD Endocrine Medical History: Positive for: Type II Diabetes Negative for: Type I Diabetes Time with diabetes: 13 yrs Treated with: Insulin Blood sugar  tested every day: Yes Tested : 2 times per day Blood sugar testing results: Breakfast: 100; Dinner: 95-126 Genitourinary Medical History: Negative for: End Stage Renal Disease Past Medical History Notes: Syphilis CKD,Nocturia Immunological Medical History: Negative for: Lupus Erythematosus; Raynauds; Scleroderma Past Medical History Notes: HIV Integumentary (Skin) Medical History: Negative for: History of Burn Musculoskeletal Medical History: Positive for: Gout; Osteoarthritis Negative for: Rheumatoid Arthritis; Osteomyelitis Past Medical History Notes: Hip Fx BiLateral Bunions Unspecified Arthritis Neurologic Medical History: Positive for: Neuropathy Negative for: Dementia; Quadriplegia; Paraplegia; Seizure Disorder Past Medical History Notes: chronic back pain Oncologic Medical History: Negative for: Received Chemotherapy; Received  Radiation Psychiatric Medical History: Negative for: Anorexia/bulimia; Confinement Anxiety HBO Extended History Items Eyes: Eyes: Cataracts Glaucoma Immunizations Pneumococcal Vaccine: Received Pneumococcal Vaccination: Yes Received Pneumococcal Vaccination On or After [id number]CHANEL, MCKESSON ([id number]) [id number] Page 11 of 12 Implantable Devices No devices added Hospitalization / Surgery History Type of Hospitalization/Surgery Colonoscopy CABG Laproscopic Cholecystectomy Left Hip Fx Left Jaw Fx Lumbar Laminectomy Family and Social History Cancer: No; Diabetes: Yes - Siblings,Father; Heart Disease: Yes - Father,Siblings; Hereditary Spherocytosis: No; Hypertension: Yes - Father,Siblings; Kidney Disease: No; Lung Disease: No; Seizures: No; Stroke: No; Thyroid Problems: No; Tuberculosis: No; Current every day smoker - 1/2 ppd / 30yr; Marital Status - Single; Alcohol Use: Moderate - 6 standard drinks per week; Drug Use: Prior History - Polysubstance Abuse; clean for 9 years; Caffeine Use: Rarely - tea; Financial Concerns: No; Food, Clothing or Shelter Needs: No; Support System Lacking: No; Transportation Concerns: No Electronic Signature(s) Signed: 09/06/2022 12:07:49 PM By: CFredirick MaudlinMD FACS Entered By: CFredirick Maudlinon 09/06/2022 09:51:40 -------------------------------------------------------------------------------- SuperBill Details Patient Name: Date of Service: NRaechel Ache11/14/2023 Medical Record Number: [id number] Account Number: [id number] of Birth/Sex: Treating RN: 410-04-1951(72y.o. M) Primary Care Provider: MAlain HoneyOther Clinician: Referring Provider: Treating Provider/Extender: CNeil Crouchin Treatment: 27 Diagnosis Coding ICD-10 Codes Code Description [id number]-pressure chronic ulcer of other part of right foot with fat layer exposed E11.22 Type 2  diabetes mellitus with diabetic chronic kidney disease J44.9 Chronic obstructive pulmonary disease, unspecified I50.42 Chronic combined systolic (congestive) and diastolic (congestive) heart failure I10 Essential (primary) hypertension N25.81 Secondary hyperparathyroidism of renal origin N18.6 End stage renal disease B20 Human immunodeficiency virus [HIV] disease M86.671 Other chronic osteomyelitis, right ankle and foot Facility Procedures : 7 CPT4 Code: [id number] Description: 9212 - WOUND CARE VISIT-LEV 2 EST PT Modifier: Quantity: 1 Physician Procedures : CPT4 Code Description Modifier [id number] PHYS LEVEL 3 - EST PT ICD-10 Diagnosis Description L97.512 Non-pressure chronic ulcer of other part of right foot with fat layer exposed I50.42 Chronic combined systolic (congestive) and diastolic  (congestive) heart failure E11.22 Type 2 diabetes mellitus with diabetic chronic kidney disease M86.671 Other chronic osteomyelitis, right ankle and foot Quantity: 1 Electronic Signature(s) Signed: 09/06/2022 12:07:49 PM By: CFredirick MaudlinMD FACS NRobie Ridge([id number] Page 12 of 12 Signed: 09/06/2022 3:48:01 PM By: HAdline PealsPrevious Signature: 09/06/2022 9:54:38 AM Version By: CFredirick MaudlinMD FACS Entered By: HAdline Pealson 09/06/2022 10:29:29

## 2022-09-06 NOTE — Progress Notes (Signed)
LYNWOOD, KUBISIAK ([id number]) [id number] Page 1 of 8 Visit Report for 09/06/2022 Arrival Information Details Patient Name: Date of Service: Jacob, [name] 09/06/2022 9:15 A M Medical Record Number: [id number] Patient Account Number: [id number] Date of Birth/Sex: Treating RN: Dec 05, 1950 (71 y.o. Jacob [name] Primary Care Dalyce Renne: Alain Honey Other Clinician: Referring Ninamarie Keel: Treating Riyanna Crutchley/Extender: Neil Crouch in Treatment: 27 Visit Information History Since Last Visit Added or deleted any medications: No Patient Arrived: Jacob [name] Any new allergies or adverse reactions: No Arrival Time: 09:27 Had a fall or experienced change in No Accompanied By: self activities of daily living that may affect Transfer Assistance: None risk of falls: Patient Identification Verified: Yes Signs or symptoms of abuse/neglect since last visito No Secondary Verification Process Completed: Yes Hospitalized since last visit: No Patient Requires Transmission-Based Precautions: No Implantable device outside of the clinic excluding No Patient Has Alerts: No cellular tissue based products placed in the center since last visit: Has Dressing in Place as Prescribed: Yes Pain Present Now: No Electronic Signature(s) Signed: 09/06/2022 3:48:01 PM By: Adline Peals Entered By: Adline Peals on 09/06/2022 09:33:06 -------------------------------------------------------------------------------- Clinic Level of Care Assessment Details Patient Name: Date of Service: TERY, [name] 09/06/2022 9:15 A M Medical Record Number: [id number] Patient Account Number: [id number] Date of Birth/Sex: Treating RN: 11-14-1950 (71 y.o. Jacob [name] Primary Care Eveny Anastas: Alain Honey Other Clinician: Referring Montserrat Shek: Treating Natalya Domzalski/Extender: Neil Crouch in Treatment: 27 Clinic Level of Care  Assessment Items TOOL 4 Quantity Score X- 1 0 Use when only an EandM is performed on FOLLOW-UP visit ASSESSMENTS - Nursing Assessment / Reassessment X- 1 10 Reassessment of Co-morbidities (includes updates in patient status) X- 1 5 Reassessment of Adherence to Treatment Plan ASSESSMENTS - Wound and Skin A ssessment / Reassessment X - Simple Wound Assessment / Reassessment - one wound 1 5 '[]'$  - 0 Complex Wound Assessment / Reassessment - multiple wounds '[]'$  - 0 Dermatologic / Skin Assessment (not related to wound area) ASSESSMENTS - Focused Assessment '[]'$  - 0 Circumferential Edema Measurements - multi extremities '[]'$  - 0 Nutritional Assessment / Counseling / Intervention Jacob [name] ([id number]) [id number] Page 2 of 8 '[]'$  - 0 Lower Extremity Assessment (monofilament, tuning fork, pulses) '[]'$  - 0 Peripheral Arterial Disease Assessment (using hand held doppler) ASSESSMENTS - Ostomy and/or Continence Assessment and Care '[]'$  - 0 Incontinence Assessment and Management '[]'$  - 0 Ostomy Care Assessment and Management (repouching, etc.) PROCESS - Coordination of Care X - Simple Patient / Family Education for ongoing care 1 15 '[]'$  - 0 Complex (extensive) Patient / Family Education for ongoing care X- 1 10 Staff obtains Programmer, systems, Records, T Results / Process Orders est '[]'$  - 0 Staff telephones HHA, Nursing Homes / Clarify orders / etc '[]'$  - 0 Routine Transfer to another Facility (non-emergent condition) '[]'$  - 0 Routine Hospital Admission (non-emergent condition) '[]'$  - 0 New Admissions / Biomedical engineer / Ordering NPWT Apligraf, etc. , '[]'$  - 0 Emergency Hospital Admission (emergent condition) X- 1 10 Simple Discharge Coordination '[]'$  - 0 Complex (extensive) Discharge Coordination PROCESS - Special Needs '[]'$  - 0 Pediatric / Minor Patient Management '[]'$  - 0 Isolation Patient Management '[]'$  - 0 Hearing / Language / Visual special needs '[]'$  -  0 Assessment of Community assistance (transportation, D/C planning, etc.) '[]'$  - 0 Additional assistance / Altered mentation '[]'$  - 0 Support Surface(s) Assessment (bed, cushion, seat, etc.) INTERVENTIONS - Wound Cleansing / Measurement X -  Simple Wound Cleansing - one wound 1 5 '[]'$  - 0 Complex Wound Cleansing - multiple wounds X- 1 5 Wound Imaging (photographs - any number of wounds) '[]'$  - 0 Wound Tracing (instead of photographs) '[]'$  - 0 Simple Wound Measurement - one wound '[]'$  - 0 Complex Wound Measurement - multiple wounds INTERVENTIONS - Wound Dressings '[]'$  - 0 Small Wound Dressing one or multiple wounds '[]'$  - 0 Medium Wound Dressing one or multiple wounds '[]'$  - 0 Large Wound Dressing one or multiple wounds '[]'$  - 0 Application of Medications - topical '[]'$  - 0 Application of Medications - injection INTERVENTIONS - Miscellaneous '[]'$  - 0 External ear exam '[]'$  - 0 Specimen Collection (cultures, biopsies, blood, body fluids, etc.) '[]'$  - 0 Specimen(s) / Culture(s) sent or taken to Lab for analysis '[]'$  - 0 Patient Transfer (multiple staff / Civil Service fast streamer / Similar devices) '[]'$  - 0 Simple Staple / Suture removal (25 or less) '[]'$  - 0 Complex Staple / Suture removal (26 or more) '[]'$  - 0 Hypo / Hyperglycemic Management (close monitor of Blood Glucose) Jacob [name] ([id number]) [id number] Page 3 of 8 '[]'$  - 0 Ankle / Brachial Index (ABI) - do not check if billed separately X- 1 5 Vital Signs Has the patient been seen at the hospital within the last three years: Yes Total Score: 70 Level Of Care: New/Established - Level 2 Electronic Signature(s) Signed: 09/06/2022 3:48:01 PM By: Adline Peals Entered By: Adline Peals on 09/06/2022 10:29:10 -------------------------------------------------------------------------------- Encounter Discharge Information Details Patient Name: Date of Service: Jacob Ache. 09/06/2022 9:15 A M Medical Record Number:  [id number] Patient Account Number: [id number] Date of Birth/Sex: Treating RN: 07-25-1951 (71 y.o. Jacob [name] Primary Care Aleicia Kenagy: Alain Honey Other Clinician: Referring Glynda Soliday: Treating Mykaila Blunck/Extender: Neil Crouch in Treatment: 27 Encounter Discharge Information Items Discharge Condition: Stable Ambulatory Status: Cane Discharge Destination: Home Transportation: Private Auto Accompanied By: self Schedule Follow-up Appointment: Yes Clinical Summary of Care: Patient Declined Electronic Signature(s) Signed: 09/06/2022 3:48:01 PM By: Sabas Sous By: Adline Peals on 09/06/2022 10:30:06 -------------------------------------------------------------------------------- Lower Extremity Assessment Details Patient Name: Date of Service: Jacob [name], Jacob [name] 09/06/2022 9:15 A M Medical Record Number: [id number] Patient Account Number: [id number] Date of Birth/Sex: Treating RN: 12-17-1950 (71 y.o. Jacob [name] Primary Care Evangelene Vora: Alain Honey Other Clinician: Referring Alante Weimann: Treating Amritha Yorke/Extender: Neil Crouch in Treatment: 27 Edema Assessment Assessed: [name]: No] Patrice Paradise: No] Edema: [Left: N] [Right: o] Calf Left: Right: Point of Measurement: From Medial Instep 33.4 cm Ankle Left: Right: Point of Measurement: From Medial Instep 19.5 cm Electronic Signature(s) Signed: 09/06/2022 3:48:01 PM By: Verdie Mosher ([id number]) PM By: Kern Reap.pdf Page 4 of 8 Signed: 09/06/2022 3:48:01 aylor Entered By: Adline Peals on 09/06/2022 09:36:14 -------------------------------------------------------------------------------- Multi Wound Chart Details Patient Name: Date of Service: Jacob [name], Jacob [name] 09/06/2022 9:15 A M Medical Record Number: [id number] Patient Account Number: [id number] Date of Birth/Sex: Treating  RN: 1951-04-27 (71 y.o. M) Primary Care Tadd Holtmeyer: Alain Honey Other Clinician: Referring Chantee Cerino: Treating Asiel Chrostowski/Extender: Neil Crouch in Treatment: 27 Vital Signs Height(in): 48 Pulse(bpm): 39 Weight(lbs): 172 Blood Pressure(mmHg): 109/56 Body Mass Index(BMI): 22.1 Temperature(F): 97.9 Respiratory Rate(breaths/min): 18 [8:Photos:] [N/A:N/A] Right, Plantar Foot N/A N/A Wound Location: Blister N/A N/A Wounding Event: Diabetic Wound/Ulcer of the Lower N/A N/A Primary Etiology: Extremity Cataracts, Glaucoma, Anemia, Chronic N/A N/A Comorbid History: Obstructive Pulmonary Disease (COPD), Congestive Heart Failure, Coronary Artery  Disease, Hypertension, Myocardial Infarction, Hepatitis C, Type II Diabetes, Gout, Osteoarthritis, Neuropathy 03/08/2022 N/A N/A Date Acquired: 48 N/A N/A Weeks of Treatment: Open N/A N/A Wound Status: No N/A N/A Wound Recurrence: 0x0x0 N/A N/A Measurements L x W x D (cm) 0 N/A N/A A (cm) : rea 0 N/A N/A Volume (cm) : 100.00% N/A N/A % Reduction in A rea: 100.00% N/A N/A % Reduction in Volume: Grade 2 N/A N/A Classification: None Present N/A N/A Exudate A mount: Thickened N/A N/A Wound Margin: None Present (0%) N/A N/A Granulation A mount: None Present (0%) N/A N/A Necrotic A mount: Fascia: No N/A N/A Exposed Structures: Fat Layer (Subcutaneous Tissue): No Tendon: No Muscle: No Joint: No Bone: No Large (67-100%) N/A N/A Epithelialization: Callus: Yes N/A N/A Periwound Skin Texture: No Abnormalities Noted N/A N/A Periwound Skin Moisture: No Abnormalities Noted N/A N/A Periwound Skin Color: No Abnormality N/A N/A Temperature: Yes N/A N/A Tenderness on Palpation: Treatment Notes HOY, FALLERT ([id number]) [id number] Page 5 of 8 Electronic Signature(s) Signed: 09/06/2022 9:50:15 AM By: Fredirick Maudlin MD FACS Entered By: Fredirick Maudlin on 09/06/2022  09:50:15 -------------------------------------------------------------------------------- Multi-Disciplinary Care Plan Details Patient Name: Date of Service: Jacob Ache. 09/06/2022 9:15 A M Medical Record Number: [id number] Patient Account Number: [id number] Date of Birth/Sex: Treating RN: [birth year] (71 y.o. Jacob [name] Primary Care Emila Steinhauser: Alain Honey Other Clinician: Referring Jillisa Harris: Treating Kierra Jezewski/Extender: Neil Crouch in Treatment: [place] reviewed with physician Active Inactive Electronic Signature(s) Signed: 09/06/2022 3:48:01 PM By: Sabas Sous By: Adline Peals on 09/06/2022 09:40:37 -------------------------------------------------------------------------------- Pain Assessment Details Patient Name: Date of Service: Jacob, [name] 09/06/2022 9:15 A M Medical Record Number: [id number] Patient Account Number: [id number] Date of Birth/Sex: Treating RN: 17-Jun-1951 (71 y.o. Jacob [name] Primary Care Eliazer Hemphill: Alain Honey Other Clinician: Referring Niasia Lanphear: Treating Kamdon Reisig/Extender: Neil Crouch in Treatment: 27 Active Problems Location of Pain Severity and Description of Pain Patient Has Paino No Site Locations Rate the pain. Current Pain Level: 0 Pain Management and Medication Current Pain Management: TEANDRE, HAMRE ([id number]) [id number] Page 6 of 8 Electronic Signature(s) Signed: 09/06/2022 3:48:01 PM By: Sabas Sous By: Adline Peals on 09/06/2022 09:36:11 -------------------------------------------------------------------------------- Patient/Caregiver Education Details Patient Name: Date of Service: Jacob Ache 11/14/2023andnbsp9:15 A M Medical Record Number: [id number] Patient Account Number: [id number] Date of Birth/Gender: Treating RN: 21-Aug-1951 (71 y.o. Jacob [name] Primary Care Physician: Alain Honey Other Clinician: Referring Physician: Treating Physician/Extender: Neil Crouch in Treatment: 27 Education Assessment Education Provided To: Patient Education Topics Provided Wound/Skin Impairment: Methods: Explain/Verbal Responses: Reinforcements needed, State content correctly Electronic Signature(s) Signed: 09/06/2022 3:48:01 PM By: Adline Peals Entered By: Adline Peals on 09/06/2022 09:40:13 -------------------------------------------------------------------------------- Wound Assessment Details Patient Name: Date of Service: Jacob Ache. 09/06/2022 9:15 A M Medical Record Number: [id number] Patient Account Number: [id number] Date of Birth/Sex: Treating RN: 06-02-1951 (71 y.o. Jacob [name] Primary Care Lesean Woolverton: Alain Honey Other Clinician: Referring Vedanshi Massaro: Treating Addelynn Batte/Extender: Neil Crouch in Treatment: 27 Wound Status Wound Number: 8 Primary Diabetic Wound/Ulcer of the Lower Extremity Etiology: Wound Location: Right, Plantar Foot Wound Open Wounding Event: Blister Status: Date Acquired: 03/08/2022 Comorbid Cataracts, Glaucoma, Anemia, Chronic Obstructive Pulmonary Weeks Of Treatment: 26 History: Disease (COPD), Congestive Heart Failure, Coronary Artery Clustered Wound: No Disease, Hypertension, Myocardial Infarction, Hepatitis C, Type II Diabetes, Gout, Osteoarthritis, Neuropathy Photos Jacob [name], Jacob [name] (  [id number]) [id number] Page 7 of 8 Wound Measurements Length: (cm) Width: (cm) Depth: (cm) Area: (cm) Volume: (cm) 0 % Reduction in Area: 100% 0 % Reduction in Volume: 100% 0 Epithelialization: Large (67-100%) 0 Tunneling: No 0 Undermining: No Wound Description Classification: Grade 2 Wound Margin: Thickened Exudate Amount: None Present Foul Odor After Cleansing:  No Slough/Fibrino No Wound Bed Granulation Amount: None Present (0%) Exposed Structure Necrotic Amount: None Present (0%) Fascia Exposed: No Fat Layer (Subcutaneous Tissue) Exposed: No Tendon Exposed: No Muscle Exposed: No Joint Exposed: No Bone Exposed: No Periwound Skin Texture Texture Color No Abnormalities Noted: No No Abnormalities Noted: Yes Callus: Yes Temperature / Pain Temperature: No Abnormality Moisture No Abnormalities Noted: Yes Tenderness on Palpation: Yes Electronic Signature(s) Signed: 09/06/2022 3:48:01 PM By: Adline Peals Entered By: Adline Peals on 09/06/2022 09:37:53 -------------------------------------------------------------------------------- Vitals Details Patient Name: Date of Service: Jacob Katos [name]. 09/06/2022 9:15 A M Medical Record Number: [id number] Patient Account Number: [id number] Date of Birth/Sex: Treating RN: Sep 06, 1951 (71 y.o. Jacob [name] Primary Care Koron Godeaux: Alain Honey Other Clinician: Referring Arjan Strohm: Treating Zahriah Roes/Extender: Neil Crouch in Treatment: 27 Vital Signs Time Taken: 09:33 Temperature (F): 97.9 Height (in): 74 Pulse (bpm): 76 Weight (lbs): 172 Respiratory Rate (breaths/min): 18 Body Mass Index (BMI): 22.1 Blood Pressure (mmHg): 109/56 Reference Range: 80 - 120 mg / dl Electronic Signature(s) Signed: 09/06/2022 3:48:01 PM By: Verdie Mosher ([id number]) [id number] Page 8 of 8 Entered By: Adline Peals on 09/06/2022 09:36:05

## 2022-09-07 ENCOUNTER — Encounter: Payer: Self-pay | Admitting: Family Medicine

## 2022-09-07 ENCOUNTER — Other Ambulatory Visit (HOSPITAL_COMMUNITY): Payer: Self-pay

## 2022-09-07 ENCOUNTER — Ambulatory Visit (INDEPENDENT_AMBULATORY_CARE_PROVIDER_SITE_OTHER): Payer: Medicare Other | Admitting: Family Medicine

## 2022-09-07 VITALS — BP 138/70 | HR 82 | Temp 97.1°F | Ht 69.0 in | Wt 187.6 lb

## 2022-09-07 DIAGNOSIS — D638 Anemia in other chronic diseases classified elsewhere: Secondary | ICD-10-CM

## 2022-09-07 DIAGNOSIS — G8929 Other chronic pain: Secondary | ICD-10-CM

## 2022-09-07 DIAGNOSIS — M868X8 Other osteomyelitis, other site: Secondary | ICD-10-CM | POA: Diagnosis not present

## 2022-09-07 DIAGNOSIS — G894 Chronic pain syndrome: Secondary | ICD-10-CM

## 2022-09-07 DIAGNOSIS — I5042 Chronic combined systolic (congestive) and diastolic (congestive) heart failure: Secondary | ICD-10-CM

## 2022-09-07 DIAGNOSIS — N186 End stage renal disease: Secondary | ICD-10-CM | POA: Diagnosis not present

## 2022-09-07 DIAGNOSIS — E1140 Type 2 diabetes mellitus with diabetic neuropathy, unspecified: Secondary | ICD-10-CM

## 2022-09-07 DIAGNOSIS — L97414 Non-pressure chronic ulcer of right heel and midfoot with necrosis of bone: Secondary | ICD-10-CM

## 2022-09-07 DIAGNOSIS — B20 Human immunodeficiency virus [HIV] disease: Secondary | ICD-10-CM

## 2022-09-07 DIAGNOSIS — E11621 Type 2 diabetes mellitus with foot ulcer: Secondary | ICD-10-CM | POA: Diagnosis not present

## 2022-09-07 DIAGNOSIS — F1190 Opioid use, unspecified, uncomplicated: Secondary | ICD-10-CM | POA: Diagnosis not present

## 2022-09-07 DIAGNOSIS — M545 Low back pain, unspecified: Secondary | ICD-10-CM

## 2022-09-07 DIAGNOSIS — Z992 Dependence on renal dialysis: Secondary | ICD-10-CM | POA: Diagnosis not present

## 2022-09-07 DIAGNOSIS — D631 Anemia in chronic kidney disease: Secondary | ICD-10-CM | POA: Diagnosis not present

## 2022-09-07 DIAGNOSIS — N2581 Secondary hyperparathyroidism of renal origin: Secondary | ICD-10-CM | POA: Diagnosis not present

## 2022-09-07 DIAGNOSIS — I10 Essential (primary) hypertension: Secondary | ICD-10-CM

## 2022-09-07 MED ORDER — OXYCODONE-ACETAMINOPHEN 5-325 MG PO TABS: 1.0000 | ORAL_TABLET | Freq: Four times a day (QID) | ORAL | 0 refills | Status: DC | PRN

## 2022-09-07 NOTE — Progress Notes (Signed)
Provider:  Alain Honey, MD  Careteam: Patient Care Team: [name], MD as PCP - General (Family Medicine) [name], MD as PCP - Infectious Diseases (Infectious Diseases) [name], MD as PCP - Cardiology (Cardiology) [name], MD as Consulting Physician (Ophthalmology) [name], MD as Consulting Physician (Cardiology) [name], MD as Consulting Physician (Infectious Diseases) Gardiner Barefoot, DPM as Consulting Physician (Podiatry) [name], Nelda Bucks, NP as Nurse Practitioner (Family Medicine) Pa, [facility], [facility]  PLACE OF SERVICE:  [facility]  Advanced Directive information    Allergies  Allergen Reactions   Augmentin [Amoxicillin-Pot Clavulanate] Diarrhea and Other (See Comments)    Severe diarrhea   Morphine Sulfate Anaphylaxis    Can't mix with oxycodone    Mucinex Fast-Max Other (See Comments)    Intense sweating    Amphetamines Other (See Comments)    Unknown reaction    No chief complaint on file.    HPI: Patient is a 71 y.o. male patient is here for medical management of chronic problems including stage IV CKD on dialysis chronic back pain, type 2 diabetes, A-fib,.  He was hospitalized recently for osteomyelitis and has fourth toe on the left foot.  That wound has remarkably healed and he finished his course of antibiotics. He is using the oxycodone as prescribed.  We will refill that weekly and I do not think he is abusing this medicine.  It does contribute I think on the other hand to his quality of life. Reviewed labs from dialysis today creatinine is up to 10.  A1c is good at 6.2.  CBC shows anemia as expected with CKD  Review of Systems:  Review of Systems  Constitutional:  Positive for malaise/fatigue.  HENT: Negative.    Respiratory: Negative.    Cardiovascular: Negative.   Genitourinary: Negative.   Musculoskeletal: Negative.   Neurological: Negative.     Past Medical  History:  Diagnosis Date   Acute respiratory failure ([place]) 03/01/2018   Anemia    Arthritis    "all over; mostly knees and back" (02/28/2018)   Chronic combined systolic and diastolic CHF (congestive heart failure) (HCC)    Chronic lower back pain    stenosis   Community acquired pneumonia 09/06/2013   COPD (chronic obstructive pulmonary disease) ([place])    Coronary atherosclerosis of native coronary artery    a. 02/2003 s/p CABG x 2 (VG->RI, VG->RPDA; b. 11/2019 PCI: LM nl, LAD 90d, D3 50, RI 100, LCX 100p, OM3 100 - fills via L->L collats from D2/dLAD, RCA 100p, VG->RPDA ok, VG->RI 95 (3.5x48 Synergy XD DES).   Drug abuse ([place])    hx; tested for cocaine as recently as 2/08. says he is not using drugs now - avoided defib. for this reason    ESRD (end stage renal disease) ([place])    [place] M-W-F- [facility]   Fall at home 10/2020   GERD (gastroesophageal reflux disease)    takes OTC meds as needed   GI bleeding    a. 11/2019 EGD: angiodysplastic lesions w/ bleeding s/p argon plasma/clipping/epi inj. Multiple admissions for the same.   Glaucoma    uses eye drops daily   Hepatitis B 1968   "tx'd w/isolation; caught it from toilet stools in gym"   History of blood transfusion 03/01/2019   History of colon polyps    benign   History of gout    takes Allopurinol daily as well as Colchicine-if needed (02/28/2018)  History of kidney stones    HTN (hypertension)    takes Coreg,Imdur.and Apresoline daily   Human immunodeficiency virus (HIV) disease ([place]) dx'd 1995   on [name] as of 12/2020.     Hyperlipidemia    Ischemic cardiomyopathy    a. 01/2019 Echo: EF 40-45%, diffuse HK, mild basal septal hypertrophy. Diast dysfxn. Nl RV size/fxn. Sev dil LA. Triv MR/TR/PR.   Muscle spasm    takes Zanaflex as needed   Myocardial infarction ([place]) ~ 2004/2005   Nocturia    Peripheral neuropathy    takes gabapentin daily   Pneumonia    "at least twice" (02/28/2018)   SDH (subdural hematoma) (HCC)     Syphilis, unspecified    Type II diabetes mellitus ([place]) 2004   Lantus daily.Average fasting blood sugar 125-199   Wears glasses    Wears partial dentures    Past Surgical History:  Procedure Laterality Date   AV FISTULA PLACEMENT Left 08/02/2018   Procedure: ARTERIOVENOUS (AV) FISTULA CREATION  left arm radiocephlic;  Surgeon: Marty Heck, MD;  Location: [place];  Service: Vascular;  Laterality: Left;   AV FISTULA PLACEMENT Left 08/01/2019   Procedure: LEFT BRACHIOCEPHALIC ARTERIOVENOUS (AV) FISTULA CREATION;  Surgeon: [name], MD;  Location: [place];  Service: Vascular;  Laterality: Left;   AV FISTULA PLACEMENT Right 04/12/2022   Procedure: RIGHT UPPER EXTREMITY ARTERIOVENOUS (AV)  GRAFT INSERTION USING GORE STRETCH 4-7 MM;  Surgeon: Angelia Mould, MD;  Location: [place];  Service: Vascular;  Laterality: Right;   [place] Left 10/03/2019   Procedure: BASILIC VEIN TRANSPOSITION LEFT SECOND STAGE;  Surgeon: [name], MD;  Location: [place];  Service: Vascular;  Laterality: Left;   BIOPSY  01/25/2021   Procedure: BIOPSY;  Surgeon: Doran Stabler, MD;  Location: [facility] ENDOSCOPY;  Service: Gastroenterology;;   CARDIAC CATHETERIZATION  10/2002; 12/19/2004   [name] 03/08/2011   COLONOSCOPY  2013   [place]    CORONARY ARTERY BYPASS GRAFT  02/24/2003   CABG X2/notes 03/08/2011   CORONARY STENT INTERVENTION N/A 12/19/2019   Procedure: CORONARY STENT INTERVENTION;  Surgeon: Jettie Booze, MD;  Location: [place] CV LAB;  Service: Cardiovascular;  Laterality: N/A;   ENTEROSCOPY N/A 01/25/2021   Procedure: ENTEROSCOPY;  Surgeon: Doran Stabler, MD;  Location: [place];  Service: Gastroenterology;  Laterality: N/A;   ENTEROSCOPY N/A 02/13/2021   Procedure: ENTEROSCOPY;  Surgeon: Jackquline Denmark, MD;  Location: [facility] ENDOSCOPY;  Service: Endoscopy;  Laterality: N/A;   ENTEROSCOPY N/A 05/07/2021   Procedure: ENTEROSCOPY;  Surgeon: Yetta Flock,  MD;  Location: [facility] ENDOSCOPY;  Service: Gastroenterology;  Laterality: N/A;   ESOPHAGOGASTRODUODENOSCOPY (EGD) WITH PROPOFOL N/A 02/08/2019   Procedure: ESOPHAGOGASTRODUODENOSCOPY (EGD) WITH PROPOFOL;  Surgeon: Milus Banister, MD;  Location: [place];  Service: Gastroenterology;  Laterality: N/A;   ESOPHAGOGASTRODUODENOSCOPY (EGD) WITH PROPOFOL N/A 12/22/2019   Procedure: ESOPHAGOGASTRODUODENOSCOPY (EGD) WITH PROPOFOL;  Surgeon: Lavena Bullion, DO;  Location: [place];  Service: Gastroenterology;  Laterality: N/A;   ESOPHAGOGASTRODUODENOSCOPY (EGD) WITH PROPOFOL N/A 10/19/2020   Procedure: ESOPHAGOGASTRODUODENOSCOPY (EGD) WITH PROPOFOL;  Surgeon: Jackquline Denmark, MD;  Location: [facility] ENDOSCOPY;  Service: Endoscopy;  Laterality: N/A;   ESOPHAGOGASTRODUODENOSCOPY (EGD) WITH PROPOFOL N/A 12/22/2020   Procedure: ESOPHAGOGASTRODUODENOSCOPY (EGD) WITH PROPOFOL;  Surgeon: Gatha Mayer, MD;  Location: [place];  Service: Endoscopy;  Laterality: N/A;   ESOPHAGOGASTRODUODENOSCOPY (EGD) WITH PROPOFOL N/A 01/09/2021   Procedure: ESOPHAGOGASTRODUODENOSCOPY (EGD) WITH PROPOFOL;  Surgeon: Henrene Pastor,  Docia Chuck, MD;  Location: [facility] ENDOSCOPY;  Service: Endoscopy;  Laterality: N/A;   HEMOSTASIS CLIP PLACEMENT  12/22/2019   Procedure: HEMOSTASIS CLIP PLACEMENT;  Surgeon: Lavena Bullion, DO;  Location: [place] ENDOSCOPY;  Service: Gastroenterology;;   HEMOSTASIS CLIP PLACEMENT  12/22/2020   Procedure: HEMOSTASIS CLIP PLACEMENT;  Surgeon: Gatha Mayer, MD;  Location: [facility] ENDOSCOPY;  Service: Endoscopy;;   HEMOSTASIS CONTROL  12/22/2020   Procedure: HEMOSTASIS CONTROL/hemospray;  Surgeon: Gatha Mayer, MD;  Location: [place];  Service: Endoscopy;;   HOT HEMOSTASIS N/A 02/08/2019   Procedure: HOT HEMOSTASIS (ARGON PLASMA COAGULATION/BICAP);  Surgeon: Milus Banister, MD;  Location: [facility] ENDOSCOPY;  Service: Gastroenterology;  Laterality: N/A;   HOT HEMOSTASIS N/A 12/22/2019   Procedure: HOT HEMOSTASIS (ARGON PLASMA  COAGULATION/BICAP);  Surgeon: Lavena Bullion, DO;  Location: [facility] ENDOSCOPY;  Service: Gastroenterology;  Laterality: N/A;   HOT HEMOSTASIS N/A 10/19/2020   Procedure: HOT HEMOSTASIS (ARGON PLASMA COAGULATION/BICAP);  Surgeon: Jackquline Denmark, MD;  Location: [facility] ENDOSCOPY;  Service: Endoscopy;  Laterality: N/A;   HOT HEMOSTASIS N/A 12/22/2020   Procedure: HOT HEMOSTASIS (ARGON PLASMA COAGULATION/BICAP);  Surgeon: Gatha Mayer, MD;  Location: [facility] ENDOSCOPY;  Service: Endoscopy;  Laterality: N/A;   HOT HEMOSTASIS N/A 01/09/2021   Procedure: HOT HEMOSTASIS (ARGON PLASMA COAGULATION/BICAP);  Surgeon: Irene Shipper, MD;  Location: [facility] ENDOSCOPY;  Service: Endoscopy;  Laterality: N/A;   HOT HEMOSTASIS N/A 01/25/2021   Procedure: HOT HEMOSTASIS (ARGON PLASMA COAGULATION/BICAP);  Surgeon: Doran Stabler, MD;  Location: [place];  Service: Gastroenterology;  Laterality: N/A;   HOT HEMOSTASIS N/A 02/13/2021   Procedure: HOT HEMOSTASIS (ARGON PLASMA COAGULATION/BICAP);  Surgeon: Jackquline Denmark, MD;  Location: [facility] ENDOSCOPY;  Service: Endoscopy;  Laterality: N/A;   HOT HEMOSTASIS N/A 05/07/2021   Procedure: HOT HEMOSTASIS (ARGON PLASMA COAGULATION/BICAP);  Surgeon: Yetta Flock, MD;  Location: [facility] ENDOSCOPY;  Service: Gastroenterology;  Laterality: N/A;   INSERTION OF DIALYSIS CATHETER N/A 02/08/2022   Procedure: INSERTION OF TUNNELED DIALYSIS CATHETER;  Surgeon: Angelia Mould, MD;  Location: [place];  Service: Vascular;  Laterality: N/A;   INTERTROCHANTERIC HIP FRACTURE SURGERY Left 11/2006   [name] 03/08/2011   INTRAVASCULAR ULTRASOUND/IVUS N/A 12/19/2019   Procedure: Intravascular Ultrasound/IVUS;  Surgeon: Jettie Booze, MD;  Location: [place] CV LAB;  Service: Cardiovascular;  Laterality: N/A;   IR FLUORO GUIDE CV LINE LEFT  03/14/2022   IR FLUORO GUIDE CV LINE RIGHT  07/24/2019   IR FLUORO GUIDE CV LINE RIGHT  07/30/2019   IR US GUIDE VASC ACCESS RIGHT  07/24/2019   IR US GUIDE VASC  ACCESS RIGHT  07/30/2019   LAPAROSCOPIC CHOLECYSTECTOMY  05/2006   LIGATION OF ARTERIOVENOUS  FISTULA Left 02/08/2022   Procedure: LIGATION OF LEFT ARTERIOVENOUS  FISTULA;  Surgeon: Angelia Mould, MD;  Location: [place];  Service: Vascular;  Laterality: Left;   LIGATION OF COMPETING BRANCHES OF ARTERIOVENOUS FISTULA Left 11/05/2018   Procedure: LIGATION OF COMPETING BRANCHES OF ARTERIOVENOUS FISTULA  LEFT  ARM;  Surgeon: Marty Heck, MD;  Location[facility];  Service: Vascular;  Laterality: Left;   LUMBAR LAMINECTOMY/DECOMPRESSION MICRODISCECTOMY N/A 02/29/2016   Procedure: Left L4-5 Lateral Recess Decompression, Removal Extradural Intraspinal Facet Cyst;  Surgeon: Marybelle Killings, MD;  Location: [place];  Service: Orthopedics;  Laterality: N/A;   METATARSAL HEAD EXCISION Right 07/20/2022   Procedure: Irragation and debridement of right foot wound with extraction of all necrotic soft tissue and bone;  Surgeon: Ames Coupe  F, DPM;  Location: [place];  Service: Podiatry;  Laterality: Right;  Right 4th Met head and base of proximal phalanx resection   MULTIPLE TOOTH EXTRACTIONS     ORIF MANDIBULAR FRACTURE Left 08/13/2004   ORIF of left body fracture mandible with KLS Martin 2.3-mm six hole/notes 03/08/2011   RIGHT/LEFT HEART CATH AND CORONARY/GRAFT ANGIOGRAPHY N/A 12/19/2019   Procedure: RIGHT/LEFT HEART CATH AND CORONARY/GRAFT ANGIOGRAPHY;  Surgeon: Jettie Booze, MD;  Location: [place] CV LAB;  Service: Cardiovascular;  Laterality: N/A;   SCLEROTHERAPY  12/22/2019   Procedure: SCLEROTHERAPY;  Surgeon: Lavena Bullion, DO;  Location: [place];  Service: Gastroenterology;;   Clide Deutscher  02/13/2021   Procedure: Clide Deutscher;  Surgeon: Jackquline Denmark, MD;  Location: [facility] ENDOSCOPY;  Service: Endoscopy;;   Social History:   reports that he has been smoking cigarettes. He has a 21.50 pack-year smoking history. He has never been exposed to tobacco smoke. He has never used  smokeless tobacco. He reports current alcohol use of about 12.0 standard drinks of alcohol per week. He reports that he does not currently use drugs after having used the following drugs: Cocaine.  Family History  Problem Relation Age of Onset   Heart failure Father    Hypertension Father    Diabetes Brother    Heart attack Brother    Alzheimer's disease Mother    Stroke Sister    Diabetes Sister    Alzheimer's disease Sister    Hypertension Brother    Diabetes Brother    Drug abuse Brother    Colon cancer Neg Hx     Medications: Patient's Medications  New Prescriptions   No medications on file  Previous Medications   ACETAMINOPHEN (TYLENOL) 650 MG CR TABLET    Take 1,300 mg by mouth 3 (three) times daily.   ALLOPURINOL (ZYLOPRIM) 100 MG TABLET    Take one tablet by mouth once daily.   ATORVASTATIN (LIPITOR) 10 MG TABLET    TAKE 1 TABLET(10 MG) BY MOUTH DAILY   B-D UF III MINI PEN NEEDLES 31G X 5 MM MISC    USE 4 TIMES DAILY   BIKTARVY 50-200-25 MG TABS TABLET    Take 1 tablet by mouth daily.   CALCIUM ACETATE 667 MG TABS    Take 667-2,001 mg by mouth See admin instructions. Take 2001 mg by mouth three times a day with meals and 667 mg with each snack   CARVEDILOL (COREG) 3.125 MG TABLET    Take 1 tablet (3.125 mg total) by mouth 2 (two) times daily with a meal.   DICLOFENAC SODIUM (VOLTAREN) 1 % GEL    Apply 2 grams topically to the affected area three times daily as needed for pain.   DOCUSATE SODIUM (COLACE) 100 MG CAPSULE    Take 100 mg by mouth daily as needed for mild constipation.   FOLIC ACID (FOLVITE) 1 MG TABLET    TAKE 1 TABLET(1 MG) BY MOUTH DAILY   GABAPENTIN (NEURONTIN) 300 MG CAPSULE    TAKE 1 CAPSULE BY MOUTH DAILY.   GENTAMICIN OINTMENT (GARAMYCIN) 0.1 %    Apply 1 Application topically daily.   GLUCOSE BLOOD (ONETOUCH ULTRA) TEST STRIP    Check blood sugar three times daily E11.40   INSULIN LISPRO (HUMALOG) 100 UNIT/ML [id number]    INJECT 20 UNITS UNDER THE SKIN  THREE TIMES DAILY AS NEEDED FOR HIGH BLOOD SUGAR(ABOVE 150)   ISOSORBIDE MONONITRATE (IMDUR) 30 MG 24 HR TABLET    Take 1 tablet (  30 mg total) by mouth daily.   LANCETS (ONETOUCH DELICA PLUS [id number]) MISC    USE 3 TIMES DAILY   LATANOPROST (XALATAN) 0.005 % OPHTHALMIC SOLUTION    Place 1 drop into both eyes at bedtime.   LIDOCAINE-PRILOCAINE (EMLA) CREAM    Apply 1 application topically every Monday, Wednesday, and Friday with hemodialysis.   MENTHOL-CAMPHOR (TIGER BALM ARTHRITIS RUB) 11-11 % CREA    Apply 1 Application topically daily as needed (pain).   MULTIVITAMIN (RENA-VIT) TABS TABLET    Take 1 tablet by mouth daily.   NITROGLYCERIN (NITROSTAT) 0.3 MG SL TABLET    PLACE 1 TABLET UNDER THE TONGUE AS NEEDED FOR CHEST PAIN   OCTREOTIDE (SANDOSTATIN LAR DEPOT) 10 MG INJECTION    Inject 10 mg into the muscle every 28 days.   OMEPRAZOLE (PRILOSEC) 40 MG CAPSULE    Take 1 capsule (40 mg total) by mouth 2 (two) times daily for 28 days.   ONDANSETRON (ZOFRAN) 4 MG TABLET    TAKE 1 TABLET(4 MG) BY MOUTH EVERY 8 HOURS AS NEEDED FOR NAUSEA OR VOMITING   OXYCODONE-ACETAMINOPHEN (PERCOCET) 5-325 MG TABLET    Take 1 tablet by mouth every 6 (six) hours as needed for severe pain.   POLYETHYLENE GLYCOL (MIRALAX / GLYCOLAX) 17 G PACKET    Take 17 g by mouth daily as needed for mild constipation.   TIMOLOL (TIMOPTIC) 0.5 % OPHTHALMIC SOLUTION    Place 1 drop into both eyes in the morning.   UMECLIDINIUM-VILANTEROL (ANORO ELLIPTA) 62.5-25 MCG/ACT AEPB    INHALE 1 PUFF BY MOUTH EVERY DAY  Modified Medications   No medications on file  Discontinued Medications   No medications on file    Physical Exam:  There were no vitals filed for this visit. There is no height or weight on file to calculate BMI. Wt Readings from Last 3 Encounters:  08/09/22 190 lb (86.2 kg)  08/02/22 159 lb 3.2 oz (72.2 kg)  07/22/22 182 lb 15.7 oz (83 kg)    Physical Exam Vitals and nursing note reviewed.  Constitutional:       Appearance: Normal appearance.  HENT:     Head: Normocephalic.  Cardiovascular:     Rate and Rhythm: Normal rate and regular rhythm.  Pulmonary:     Effort: Pulmonary effort is normal.     Breath sounds: Normal breath sounds.  Musculoskeletal:        General: Normal range of motion.  Neurological:     General: No focal deficit present.     Mental Status: He is alert and oriented to person, place, and time.     Labs reviewed: Basic Metabolic Panel: Recent Labs    07/18/22 1715 07/19/22 0336 07/20/22 0601 07/20/22 1703 08/09/22 1625  NA  --  134* 136 134* 136  K  --  4.7 4.6 4.4 5.5*  CL  --  94* 93* 95* 90*  CO2  --  27 23  --  27  GLUCOSE  --  142* 145* 143* 74  BUN  --  60* 77* 39* 44*  CREATININE 15.58* 8.25* 10.73* 7.10* 8.44*  CALCIUM  --  8.4* 8.8*  --  9.7  MG 2.7*  --  2.0  --   --   PHOS  --   --  6.9*  --   --    Liver Function Tests: Recent Labs    02/24/22 0927 05/09/22 0000 07/18/22 1240 07/19/22 0336 07/20/22 0601  AST 23  --  34 33  --   ALT 11  --  23 22  --   ALKPHOS  --  96 88 100  --   BILITOT 0.4  --  0.7 1.2  --   PROT 7.6  --  7.6 7.7  --   ALBUMIN  --  4.1 3.1* 3.1* 2.9*   Recent Labs    01/21/22 0659 07/18/22 1240  LIPASE 58* 31   No results for input(s): "AMMONIA" in the last 8760 hours. CBC: Recent Labs    07/18/22 1240 07/18/22 1715 07/20/22 0601 07/20/22 1703 07/21/22 0428 08/09/22 1625  WBC 7.4   < > 4.5  --  4.2 6.1  NEUTROABS 4.2  --   --   --   --   --   HGB 9.5*   < > 8.6* 10.9* 8.3* 9.3*  HCT 29.9*   < > 26.0* 32.0* 24.5* 28.6*  MCV 106.8*   < > 104.8*  --  102.9* 104*  PLT 195   < > 149*  --  135* 129*   < > = values in this interval not displayed.   Lipid Panel: Recent Labs    02/24/22 0927  CHOL 154  HDL 34*  LDLCALC 80  TRIG 332*  CHOLHDL 4.5   TSH: No results for input(s): "TSH" in the last 8760 hours. A1C: Lab Results  Component Value Date   HGBA1C 6.1 05/09/2022      Assessment/Plan  1. Opioid use Prescribed by may have to try to improve his quality of life.  Patient is taking as prescribed  2. Anemia of chronic disease Related to CKD and erythropoietin deficiencies  3. Chronic combined systolic (congestive) and diastolic (congestive) heart failure (HCC) Well compensated at this time think dialysis helps to regulate fluid volumes  4. Chronic bilateral low back pain without sciatica Oxycodone is effective at relieving pain  5. Chronic pain syndrome Symptoms are well controlled with oxycodone he is taking medicines as prescribed  6. Diabetic ulcer of right midfoot associated with type 2 diabetes mellitus, with necrosis of bone (HCC) Amazing illness of osteomyelitis and wound  7. ESRD on hemodialysis ([place]) Goes to dialysis 3 days a week things are going well access vasculature is performing well currently  8. Essential hypertension Blood pressure today 138/70 he takes carvedilol but that is probably as much cardiac as blood pressure drug  9. Human immunodeficiency virus (HIV) disease ([place]) This problem is followed by infectious disease at [facility].  He takes Airline pilot daily  10. Type 2 diabetes mellitus with diabetic neuropathy, without long-term current use of insulin ([place]) Patient does pretty well with diabetes.  Most recent A1c from dialysis center was 6.2 today   Alain Honey, MD [place] [phone]

## 2022-09-08 ENCOUNTER — Other Ambulatory Visit: Payer: Self-pay

## 2022-09-08 ENCOUNTER — Encounter: Payer: Self-pay | Admitting: Internal Medicine

## 2022-09-08 ENCOUNTER — Ambulatory Visit (INDEPENDENT_AMBULATORY_CARE_PROVIDER_SITE_OTHER): Payer: Medicare Other | Admitting: Internal Medicine

## 2022-09-08 ENCOUNTER — Other Ambulatory Visit (HOSPITAL_COMMUNITY): Payer: Self-pay

## 2022-09-08 VITALS — BP 114/64 | HR 75 | Temp 97.8°F | Wt 188.0 lb

## 2022-09-08 DIAGNOSIS — L089 Local infection of the skin and subcutaneous tissue, unspecified: Secondary | ICD-10-CM | POA: Diagnosis not present

## 2022-09-08 DIAGNOSIS — B20 Human immunodeficiency virus [HIV] disease: Secondary | ICD-10-CM

## 2022-09-08 DIAGNOSIS — A048 Other specified bacterial intestinal infections: Secondary | ICD-10-CM | POA: Diagnosis not present

## 2022-09-08 DIAGNOSIS — E11628 Type 2 diabetes mellitus with other skin complications: Secondary | ICD-10-CM | POA: Diagnosis not present

## 2022-09-08 NOTE — Progress Notes (Signed)
Patient Active Problem List   Diagnosis Date Noted   Helicobacter pylori (H. pylori) infection 04/06/2021    Priority: High   Human immunodeficiency virus (HIV) disease ([place]) 09/02/2006    Priority: High   Diabetic infection of right foot ([place]) 07/18/2022   Chronic pain syndrome 03/29/2022   Chronic lower back pain    Thrombocytopenia (HCC)    Hypercalcemia 04/13/2021   Lumbar foraminal stenosis 03/30/2021   Gastric hemorrhage due to angiodysplasia of stomach    GI bleed 11/24/2020   COVID-19 virus infection 11/24/2020   Other mechanical complication of cranial or spinal infusion catheter, sequela 11/11/2020   Subdural hematoma ([place]) 11/06/2020   Fall at home, initial encounter 11/05/2020   Nicotine dependence, cigarettes, uncomplicated 93/81/0175   Alcohol abuse 11/05/2020   Unspecified trochanteric fracture of left femur, initial encounter for closed fracture ([place]) 11/05/2020   Traumatic subdural hematoma, initial encounter ([place]) 11/05/2020   Fracture of metatarsal of right foot, closed 11/05/2020   Nondisplaced fracture of greater trochanter of left femur, initial encounter for closed fracture ([place])    Uremia    Angiodysplasia of intestine with hemorrhage    Pruritus, unspecified 10/02/2020   Iron deficiency anemia    Acute blood loss anemia    Acute on chronic anemia    Abnormal nuclear stress test 12/19/2019   Type 2 diabetes mellitus with diabetic neuropathy, unspecified ([place]) 08/07/2019   Allergy, unspecified, initial encounter 08/17/8526   Complication of vascular dialysis catheter 08/02/2019   Drug abuse counseling and surveillance of drug abuser 08/02/2019   Other specified coagulation defects ([place]) 08/02/2019   Secondary hyperparathyroidism of renal origin ([place]) 08/02/2019   Unspecified atrial fibrillation ([place]) 08/02/2019   ESRD on hemodialysis ([place])    Cocaine abuse ([place])    CHF exacerbation ([place]) 07/21/2019   Diabetic foot ulcer ([name]) 02/11/2019    AVM (arteriovenous malformation)    Melena 02/07/2019   Symptomatic anemia 02/06/2019   Hyperlipidemia associated with type 2 diabetes mellitus ([place]) 05/30/2018   Right foot ulcer ([place]) 02/28/2018   Chronic obstructive pulmonary disease ([place]) 02/28/2018   Anemia of chronic disease 11/20/2016   CHF (congestive heart failure) ([name]) 11/10/2016   History of lumbar laminectomy for spinal cord decompression 02/29/2016   Type 2 diabetes mellitus with diabetic polyneuropathy, with long-term current use of insulin ([place]) 06/17/2015   HTN (hypertension) 04/26/2015   DM (diabetes mellitus) ([place]) 04/26/2015   Ischemic cardiomyopathy 05/12/2014   Hyperkalemia 09/06/2013   Bunion of left foot 11/25/2011   Bunion, right foot 11/25/2011   Polysubstance abuse ([place]) 07/28/2011   Hip fracture, left ([place]) 04/04/2011   Closed fracture of neck of femur ([place]) 04/04/2011   Insomnia 02/18/2011   Coronary artery disease involving native heart without angina pectoris 04/20/2009   Chronic combined systolic (congestive) and diastolic (congestive) heart failure ([place]) 04/20/2009   Mixed hyperlipidemia 11/20/2006   Gout 11/20/2006   TOBACCO ABUSE 11/20/2006   Essential hypertension 11/20/2006    Patient's Medications  New Prescriptions   No medications on file  Previous Medications   ACETAMINOPHEN (TYLENOL) 650 MG CR TABLET    Take 1,300 mg by mouth 3 (three) times daily.   ALLOPURINOL (ZYLOPRIM) 100 MG TABLET    Take one tablet by mouth once daily.   ATORVASTATIN (LIPITOR) 10 MG TABLET    TAKE 1 TABLET(10 MG) BY MOUTH DAILY   B-D UF III MINI PEN NEEDLES 31G X 5 MM MISC  USE 4 TIMES DAILY   BIKTARVY 50-200-25 MG TABS TABLET    Take 1 tablet by mouth daily.   CALCIUM ACETATE 667 MG TABS    Take 667-2,001 mg by mouth See admin instructions. Take 2001 mg by mouth three times a day with meals and 667 mg with each snack   CARVEDILOL (COREG) 3.125 MG TABLET    Take 1 tablet (3.125 mg total) by mouth 2 (two)  times daily with a meal.   DICLOFENAC SODIUM (VOLTAREN) 1 % GEL    Apply 2 grams topically to the affected area three times daily as needed for pain.   DOCUSATE SODIUM (COLACE) 100 MG CAPSULE    Take 100 mg by mouth daily as needed for mild constipation.   FOLIC ACID (FOLVITE) 1 MG TABLET    TAKE 1 TABLET(1 MG) BY MOUTH DAILY   GABAPENTIN (NEURONTIN) 300 MG CAPSULE    TAKE 1 CAPSULE BY MOUTH DAILY.   GENTAMICIN OINTMENT (GARAMYCIN) 0.1 %    Apply 1 Application topically daily.   GLUCOSE BLOOD (ONETOUCH ULTRA) TEST STRIP    Check blood sugar three times daily E11.40   INSULIN LISPRO (HUMALOG) 100 UNIT/ML KWIKPEN    INJECT 20 UNITS UNDER THE SKIN THREE TIMES DAILY AS NEEDED FOR HIGH BLOOD SUGAR(ABOVE 150)   ISOSORBIDE MONONITRATE (IMDUR) 30 MG 24 HR TABLET    Take 1 tablet (30 mg total) by mouth daily.   LANCETS (ONETOUCH DELICA PLUS NLZJQB34L) MISC    USE 3 TIMES DAILY   LATANOPROST (XALATAN) 0.005 % OPHTHALMIC SOLUTION    Place 1 drop into both eyes at bedtime.   LIDOCAINE-PRILOCAINE (EMLA) CREAM    Apply 1 application topically every Monday, Wednesday, and Friday with hemodialysis.   MENTHOL-CAMPHOR (TIGER BALM ARTHRITIS RUB) 11-11 % CREA    Apply 1 Application topically daily as needed (pain).   MULTIVITAMIN (RENA-VIT) TABS TABLET    Take 1 tablet by mouth daily.   NITROGLYCERIN (NITROSTAT) 0.3 MG SL TABLET    PLACE 1 TABLET UNDER THE TONGUE AS NEEDED FOR CHEST PAIN   OCTREOTIDE (SANDOSTATIN LAR DEPOT) 10 MG INJECTION    Inject 10 mg into the muscle every 28 days.   ONDANSETRON (ZOFRAN) 4 MG TABLET    TAKE 1 TABLET(4 MG) BY MOUTH EVERY 8 HOURS AS NEEDED FOR NAUSEA OR VOMITING   OXYCODONE-ACETAMINOPHEN (PERCOCET) 5-325 MG TABLET    Take 1 tablet by mouth every 6 (six) hours as needed for severe pain.   POLYETHYLENE GLYCOL (MIRALAX / GLYCOLAX) 17 G PACKET    Take 17 g by mouth daily as needed for mild constipation.   TIMOLOL (TIMOPTIC) 0.5 % OPHTHALMIC SOLUTION    Place 1 drop into both eyes in  the morning.   UMECLIDINIUM-VILANTEROL (ANORO ELLIPTA) 62.5-25 MCG/ACT AEPB    INHALE 1 PUFF BY MOUTH EVERY DAY  Modified Medications   No medications on file  Discontinued Medications   No medications on file    Subjective: Ray is in for his routine HIV follow-up visit.  He has not had any problems obtaining, taking or tolerating his Biktarvy and does not recall missing doses.  On Monday, Wednesday and Friday when he has dialysis he takes his Biktarvy around 11 AM after dialysis.  On other days of the week he takes it when he first wakes up around 5 AM.  He completed a long course of IV vancomycin and cefepime for his right fourth metatarsal osteomyelitis last week.  He did not have  any problems tolerating his antibiotics.  His right foot wounds are healing nicely without drainage, new redness or swelling.  He is not having any acid reflux symptoms.  He denies feeling depressed but says that he has been more tired than usual.  He says he does not sleep well at night.  He says that he frequently sleeps while on dialysis and after he gets home from dialysis.  Review of Systems: Review of Systems  Constitutional:  Negative for fever and weight loss.  Gastrointestinal:  Negative for abdominal pain, diarrhea, heartburn, nausea and vomiting.  Musculoskeletal:  Positive for back pain and joint pain.  Psychiatric/Behavioral:  Negative for depression. The patient has insomnia. The patient is not nervous/anxious.     Past Medical History:  Diagnosis Date   Acute respiratory failure ([place]) 03/01/2018   Anemia    Arthritis    "all over; mostly knees and back" (02/28/2018)   Chronic combined systolic and diastolic CHF (congestive heart failure) (HCC)    Chronic lower back pain    stenosis   Community acquired pneumonia 09/06/2013   COPD (chronic obstructive pulmonary disease) ([place])    Coronary atherosclerosis of native coronary artery    a. 02/2003 s/p CABG x 2 (VG->RI, VG->RPDA; b. 11/2019 PCI: LM  nl, LAD 90d, D3 50, RI 100, LCX 100p, OM3 100 - fills via L->L collats from D2/dLAD, RCA 100p, VG->RPDA ok, VG->RI 95 (3.5x48 Synergy XD DES).   Drug abuse ([place])    hx; tested for cocaine as recently as 2/08. says he is not using drugs now - avoided defib. for this reason    ESRD (end stage renal disease) ([place])    Hemo M-W-F- [facility]   Fall at home 10/2020   GERD (gastroesophageal reflux disease)    takes OTC meds as needed   GI bleeding    a. 11/2019 EGD: angiodysplastic lesions w/ bleeding s/p argon plasma/clipping/epi inj. Multiple admissions for the same.   Glaucoma    uses eye drops daily   Hepatitis B 1968   "tx'd w/isolation; caught it from toilet stools in gym"   History of blood transfusion 03/01/2019   History of colon polyps    benign   History of gout    takes Allopurinol daily as well as Colchicine-if needed (02/28/2018)   History of kidney stones    HTN (hypertension)    takes Coreg,Imdur.and Apresoline daily   Human immunodeficiency virus (HIV) disease ([place]) dx'd 1995   on [place] as of 12/2020.     Hyperlipidemia    Ischemic cardiomyopathy    a. 01/2019 Echo: EF 40-45%, diffuse HK, mild basal septal hypertrophy. Diast dysfxn. Nl RV size/fxn. Sev dil LA. Triv MR/TR/PR.   Muscle spasm    takes Zanaflex as needed   Myocardial infarction ([place]) ~ 2004/2005   Nocturia    Peripheral neuropathy    takes gabapentin daily   Pneumonia    "at least twice" (02/28/2018)   SDH (subdural hematoma) (HCC)    Syphilis, unspecified    Type II diabetes mellitus ([place]) 2004   Lantus daily.Average fasting blood sugar 125-199   Wears glasses    Wears partial dentures     Social History   Tobacco Use   Smoking status: Every Day    Packs/day: 0.50    Years: 43.00    Total pack years: 21.50    Types: Cigarettes    Passive exposure: Never   Smokeless tobacco: Never  Vaping Use   Vaping Use:  Never used  Substance Use Topics   Alcohol use: Yes    Alcohol/week: 12.0 standard  drinks of alcohol    Types: 12 Standard drinks or equivalent per week    Comment: occassional   Drug use: Not Currently    Types: Cocaine    Comment: hx of crack/cocaine 55yr ago 10/01/2019- none    Family History  Problem Relation Age of Onset   Heart failure Father    Hypertension Father    Diabetes Brother    Heart attack Brother    Alzheimer's disease Mother    Stroke Sister    Diabetes Sister    Alzheimer's disease Sister    Hypertension Brother    Diabetes Brother    Drug abuse Brother    Colon cancer Neg Hx     Allergies  Allergen Reactions   Augmentin [Amoxicillin-Pot Clavulanate] Diarrhea and Other (See Comments)    Severe diarrhea   Morphine Sulfate Anaphylaxis    Can't mix with oxycodone    Mucinex Fast-Max Other (See Comments)    Intense sweating    Amphetamines Other (See Comments)    Unknown reaction    Health Maintenance  Topic Date Due   Medicare Annual Wellness (AWV)  07/07/2021   COVID-19 Vaccine (5 - Pfizer risk series) 09/09/2021   COLONOSCOPY (Pts 45-422yrInsurance coverage will need to be confirmed)  10/01/2022   OPHTHALMOLOGY EXAM  10/05/2022   HEMOGLOBIN A1C  11/03/2022   FOOT EXAM  11/16/2022   LIPID PANEL  02/25/2023   Lung Cancer Screening  04/15/2023   TETANUS/TDAP  06/13/2029   Pneumonia Vaccine 6572Years old  Completed   INFLUENZA VACCINE  Completed   Hepatitis C Screening  Completed   Zoster Vaccines- Shingrix  Completed   HPV VACCINES  Aged Out    Objective:  Vitals:   09/08/22 1003  BP: 114/64  Pulse: 75  Temp: 97.8 F (36.6 C)  TempSrc: Oral  SpO2: 94%  Weight: 188 lb (85.3 kg)   Body mass index is 27.76 kg/m.  Physical Exam Constitutional:      Comments: He is calm and pleasant as usual.  Cardiovascular:     Rate and Rhythm: Normal rate and regular rhythm.     Heart sounds: No murmur heard. Pulmonary:     Effort: Pulmonary effort is normal.     Breath sounds: Normal breath sounds.  Abdominal:      General: There is distension.     Palpations: Abdomen is soft. There is no mass.     Tenderness: There is no abdominal tenderness.  Musculoskeletal:     Comments: The surgical incisions on his right foot are healing nicely without evidence of active infection.  Psychiatric:        Mood and Affect: Mood normal.     Lab Results Lab Results  Component Value Date   WBC 4.9 09/02/2022   HGB 29.1 (A) 09/02/2022   HCT 32 (A) 09/02/2022   MCV 104 (H) 08/09/2022   PLT 119 (A) 09/02/2022    Lab Results  Component Value Date   CREATININE 10.3 (A) 09/02/2022   BUN 61 (A) 09/02/2022   NA 140 09/02/2022   K 5.3 (A) 09/02/2022   CL 98 (A) 09/02/2022   CO2 27 08/09/2022    Lab Results  Component Value Date   ALT 22 07/19/2022   AST 33 07/19/2022   ALKPHOS 100 07/19/2022   BILITOT 1.2 07/19/2022    Lab Results  Component Value  Date   CHOL 154 02/24/2022   HDL 34 (L) 02/24/2022   LDLCALC 80 02/24/2022   TRIG 332 (H) 02/24/2022   CHOLHDL 4.5 02/24/2022   Lab Results  Component Value Date   LABRPR NON-REACTIVE 02/24/2022   RPRTITER 1:1 (H) 06/30/2020   HIV 1 RNA Quant  Date Value  02/24/2022 NOT DETECTED copies/mL  04/06/2021 <20 Copies/mL (H)  06/30/2020 <20 Copies/mL   CD4 T Cell Abs (/uL)  Date Value  02/24/2022 315 (L)  04/06/2021 406  06/30/2020 403     Problem List Items Addressed This Visit       High   Human immunodeficiency virus (HIV) disease ([place]) - Primary (Chronic)    His infection has been under excellent, long-term control.  He will get updated blood work today, continue Airline pilot and follow-up in 6 months.  He has already received his annual influenza vaccine and an updated COVID-vaccine.      Relevant Orders   T-helper cells (CD4) count (not at [facility])   HIV-1 RNA quant-no reflex-bld   Helicobacter pylori (H. pylori) infection    He has a history of treatment resistant Helicobacter pylori infection.  He has not had any further GI bleeding and is not  having active reflux symptoms.  He does not want to consider retreatment for H. pylori.        Unprioritized   Diabetic infection of right foot ([place])    I am hopeful that his diabetic foot infection has been cured through a combination of surgery and antibiotic therapy.         [name], MD [facility] for [facility] [place] [facility] [phone] pager   [phone] cell 09/08/2022, 10:31 AM

## 2022-09-08 NOTE — Assessment & Plan Note (Signed)
His infection has been under excellent, long-term control.  He will get updated blood work today, continue Airline pilot and follow-up in 6 months.  He has already received his annual influenza vaccine and an updated COVID-vaccine.

## 2022-09-08 NOTE — Assessment & Plan Note (Signed)
He has a history of treatment resistant Helicobacter pylori infection.  He has not had any further GI bleeding and is not having active reflux symptoms.  He does not want to consider retreatment for H. pylori.

## 2022-09-08 NOTE — Assessment & Plan Note (Signed)
I am hopeful that his diabetic foot infection has been cured through a combination of surgery and antibiotic therapy.

## 2022-09-09 DIAGNOSIS — Z992 Dependence on renal dialysis: Secondary | ICD-10-CM | POA: Diagnosis not present

## 2022-09-09 DIAGNOSIS — D631 Anemia in chronic kidney disease: Secondary | ICD-10-CM | POA: Diagnosis not present

## 2022-09-09 DIAGNOSIS — N2581 Secondary hyperparathyroidism of renal origin: Secondary | ICD-10-CM | POA: Diagnosis not present

## 2022-09-09 DIAGNOSIS — M868X8 Other osteomyelitis, other site: Secondary | ICD-10-CM | POA: Diagnosis not present

## 2022-09-09 DIAGNOSIS — N186 End stage renal disease: Secondary | ICD-10-CM | POA: Diagnosis not present

## 2022-09-09 LAB — T-HELPER CELLS (CD4) COUNT (NOT AT ARMC)
CD4 % Helper T Cell: 41 % (ref 33–65)
CD4 T Cell Abs: 322 /uL — ABNORMAL LOW (ref 400–1790)

## 2022-09-09 LAB — DRUG TOX MONITOR 1 W/CONF, ORAL FLD

## 2022-09-11 DIAGNOSIS — N2581 Secondary hyperparathyroidism of renal origin: Secondary | ICD-10-CM | POA: Diagnosis not present

## 2022-09-11 DIAGNOSIS — M868X8 Other osteomyelitis, other site: Secondary | ICD-10-CM | POA: Diagnosis not present

## 2022-09-11 DIAGNOSIS — D631 Anemia in chronic kidney disease: Secondary | ICD-10-CM | POA: Diagnosis not present

## 2022-09-11 DIAGNOSIS — Z992 Dependence on renal dialysis: Secondary | ICD-10-CM | POA: Diagnosis not present

## 2022-09-11 DIAGNOSIS — N186 End stage renal disease: Secondary | ICD-10-CM | POA: Diagnosis not present

## 2022-09-12 LAB — HIV-1 RNA QUANT-NO REFLEX-BLD
HIV 1 RNA Quant: NOT DETECTED Copies/mL
HIV-1 RNA Quant, Log: NOT DETECTED Log cps/mL

## 2022-09-13 ENCOUNTER — Encounter (HOSPITAL_COMMUNITY): Payer: Medicare Other

## 2022-09-13 DIAGNOSIS — M868X8 Other osteomyelitis, other site: Secondary | ICD-10-CM | POA: Diagnosis not present

## 2022-09-13 DIAGNOSIS — N186 End stage renal disease: Secondary | ICD-10-CM | POA: Diagnosis not present

## 2022-09-13 DIAGNOSIS — D631 Anemia in chronic kidney disease: Secondary | ICD-10-CM | POA: Diagnosis not present

## 2022-09-13 DIAGNOSIS — N2581 Secondary hyperparathyroidism of renal origin: Secondary | ICD-10-CM | POA: Diagnosis not present

## 2022-09-13 DIAGNOSIS — Z992 Dependence on renal dialysis: Secondary | ICD-10-CM | POA: Diagnosis not present

## 2022-09-14 ENCOUNTER — Ambulatory Visit (HOSPITAL_COMMUNITY)
Admission: RE | Admit: 2022-09-14 | Discharge: 2022-09-14 | Disposition: A | Discharge status: Home / Self Care | Payer: Medicare Other | Source: Ambulatory Visit | Attending: Gastroenterology | Admitting: Gastroenterology

## 2022-09-14 ENCOUNTER — Other Ambulatory Visit (HOSPITAL_COMMUNITY): Payer: Self-pay

## 2022-09-14 DIAGNOSIS — Q2730 Arteriovenous malformation, site unspecified: Secondary | ICD-10-CM | POA: Insufficient documentation

## 2022-09-14 LAB — GLUCOSE, CAPILLARY: Glucose-Capillary: 153 mg/dL — ABNORMAL HIGH (ref 70–99)

## 2022-09-14 MED ORDER — OCTREOTIDE ACETATE 20 MG IM KIT
10.0000 mg | PACK | INTRAMUSCULAR | Status: DC
  Administered 2022-09-14: 10 mg via INTRAMUSCULAR
  Filled 2022-09-14: qty 1

## 2022-09-16 ENCOUNTER — Encounter (HOSPITAL_COMMUNITY): Payer: Medicare Other

## 2022-09-16 DIAGNOSIS — D631 Anemia in chronic kidney disease: Secondary | ICD-10-CM | POA: Diagnosis not present

## 2022-09-16 DIAGNOSIS — M868X8 Other osteomyelitis, other site: Secondary | ICD-10-CM | POA: Diagnosis not present

## 2022-09-16 DIAGNOSIS — N2581 Secondary hyperparathyroidism of renal origin: Secondary | ICD-10-CM | POA: Diagnosis not present

## 2022-09-16 DIAGNOSIS — Z992 Dependence on renal dialysis: Secondary | ICD-10-CM | POA: Diagnosis not present

## 2022-09-16 DIAGNOSIS — N186 End stage renal disease: Secondary | ICD-10-CM | POA: Diagnosis not present

## 2022-09-19 DIAGNOSIS — M868X8 Other osteomyelitis, other site: Secondary | ICD-10-CM | POA: Diagnosis not present

## 2022-09-19 DIAGNOSIS — N186 End stage renal disease: Secondary | ICD-10-CM | POA: Diagnosis not present

## 2022-09-19 DIAGNOSIS — D631 Anemia in chronic kidney disease: Secondary | ICD-10-CM | POA: Diagnosis not present

## 2022-09-19 DIAGNOSIS — N2581 Secondary hyperparathyroidism of renal origin: Secondary | ICD-10-CM | POA: Diagnosis not present

## 2022-09-19 DIAGNOSIS — Z992 Dependence on renal dialysis: Secondary | ICD-10-CM | POA: Diagnosis not present

## 2022-09-20 ENCOUNTER — Ambulatory Visit: Payer: Self-pay

## 2022-09-20 ENCOUNTER — Other Ambulatory Visit: Payer: Self-pay | Admitting: Adult Health

## 2022-09-20 ENCOUNTER — Telehealth: Payer: Self-pay

## 2022-09-20 ENCOUNTER — Other Ambulatory Visit: Payer: Medicare Other

## 2022-09-20 DIAGNOSIS — Z79899 Other long term (current) drug therapy: Secondary | ICD-10-CM | POA: Diagnosis not present

## 2022-09-20 MED ORDER — OXYCODONE-ACETAMINOPHEN 5-325 MG PO TABS: 1.0000 | ORAL_TABLET | Freq: Four times a day (QID) | ORAL | 0 refills | Status: DC | PRN

## 2022-09-20 NOTE — Telephone Encounter (Signed)
RX sent see message below copied and pasted (sent in a way that only I can see and will not be able for recall in the future for other EPIC users) Medina-Vargas, Monina C, NP  You20 minutes ago (4:21 PM)    Prescription sent to pharmacy.

## 2022-09-20 NOTE — Telephone Encounter (Signed)
Patient called and asked if the NP covering for Dr.Miller would address refill request   Message routed to Durenda Age, NP

## 2022-09-20 NOTE — Patient Outreach (Addendum)
  Care Coordination   Initial Visit Note   09/20/2022 Name: Jacob [name] MRN: [id number] DOB: Jul 15, 1951  Jacob [name] is a 71 y.o. year old male who sees Jacob Honour, MD for primary care. I spoke with  Jacob [name] by phone today.  What matters to the patients health and wellness today?  Patient would like to receive in home PT to help with strengthening, endurance and pain reduction. He would like help transferring to another dialysis center.       Goals Addressed               This Visit's Progress     Patient Stated     I am having pain from a recent fall (pt-stated)        Care Coordination Interventions: Reviewed provider established plan for pain management Discussed importance of adherence to all scheduled medical appointments Counseled on the importance of reporting any/all new or changed pain symptoms or management strategies to pain management provider Advised patient to report to care team affect of pain on daily activities Assessed social determinant of health barriers Educated patient regarding the benefits of working with PT to help with strengthening, balance, endurance and pain reduction  Placed outbound call to Dr. Sabra Heck, PCP, left a vm for intake nurse to check with Dr. Sabra Heck for approval of in home PT, provided this RN contact number and requested a call back to advise         I had a fall in September (pt-stated)        Care Coordination Interventions: Provided written and verbal education re: potential causes of falls and Fall prevention strategies Screening for signs and symptoms of depression related to chronic disease state  Determined patient would like to speak with an LCSW for counseling and support of anxiety that he may be on pain medication life long for chronic pain  Sent referral to Christa See LCSW requesting assistance          I need help transferring to another dialysis center (pt-stated)        Care Coordination  Interventions: Evaluation of current treatment plan related to ESRD and patient's adherence to plan as established by provider Determined patient is currently on hemodialysis on MWF, he attends [facility], he would like to transfer to [facility] Discussed patient does not feel he is getting the best of care at his current clinic and reports having multiple infiltrations to his gortex graft due poorly trained staff          SDOH assessments and interventions completed:  Yes  SDOH Interventions Today    Flowsheet Row Most Recent Value  SDOH Interventions   Transportation Interventions Intervention Not Indicated        Care Coordination Interventions:  Yes, provided   Follow up plan: Referral made to Christa See LCSW to assist with counseling for anxiety and depression related to chronic health conditions Follow up call scheduled for 09/27/22 '@1'$ :00 PM    Encounter Outcome:  Pt. Visit Completed

## 2022-09-20 NOTE — Telephone Encounter (Signed)
Patient was in office for lab work and asked if [facility] would refill his oxycodone. Patient was informed that Dalton will not return to office until Thursday. Patient had his pill bottle which contained 4 pills.   Patient is requesting a refill of the following medications: Requested Prescriptions   Pending Prescriptions Disp Refills   oxyCODONE-acetaminophen (PERCOCET) 5-325 MG tablet 30 tablet 0    Sig: Take 1 tablet by mouth every 6 (six) hours as needed for severe pain.    Date of last refill: 09/07/2022  Refill amount: 30  Treatment agreement date: Agreement on file from 09/07/2022

## 2022-09-20 NOTE — Patient Instructions (Signed)
Visit Information  Thank you for taking time to visit with me today. Please don't hesitate to contact me if I can be of assistance to you.   Following are the goals we discussed today:   Goals Addressed               This Visit's Progress     Patient Stated     I am having pain from a recent fall (pt-stated)        Care Coordination Interventions: Reviewed provider established plan for pain management Discussed importance of adherence to all scheduled medical appointments Counseled on the importance of reporting any/all new or changed pain symptoms or management strategies to pain management provider Advised patient to report to care team affect of pain on daily activities Assessed social determinant of health barriers Educated patient regarding the benefits of working with PT to help with strengthening, balance, endurance and pain reduction  Placed outbound call to Dr. Sabra Heck, PCP, left a vm for intake nurse to check with Dr. Sabra Heck for approval of in home PT, provided this RN contact number and requested a call back to advise         I had a fall in September (pt-stated)        Care Coordination Interventions: Provided written and verbal education re: potential causes of falls and Fall prevention strategies Screening for signs and symptoms of depression related to chronic disease state  Determined patient would like to speak with an LCSW for counseling and support of anxiety that he may be on pain medication life long for chronic pain  Sent referral to Christa See LCSW requesting assistance          I need help transferring to another dialysis center (pt-stated)        Care Coordination Interventions: Evaluation of current treatment plan related to ESRD and patient's adherence to plan as established by provider Determined patient is currently on hemodialysis on MWF, he attends [facility], he would like to transfer to [facility] Discussed patient does not feel he is getting the best of care at his current clinic and reports having multiple infiltrations to his gortex graft due poorly trained staff        Our next appointment is by telephone on 09/27/22 at 1:00 PM  Please call the care guide team at [phone] if you need to cancel or reschedule your appointment.   If you are experiencing a Mental Health or [place] or need someone to talk to, please call 1-800-273-TALK (toll free, 24 hour hotline)  The patient verbalized understanding of instructions, educational materials, and care plan provided today and agreed to receive a mailed copy of patient instructions, educational materials, and care plan.   Barb Merino, RN, BSN, CCM Care Management Coordinator [facility] Care Management  Direct Phone: [phone]

## 2022-09-21 DIAGNOSIS — N186 End stage renal disease: Secondary | ICD-10-CM | POA: Diagnosis not present

## 2022-09-21 DIAGNOSIS — M868X8 Other osteomyelitis, other site: Secondary | ICD-10-CM | POA: Diagnosis not present

## 2022-09-21 DIAGNOSIS — N2581 Secondary hyperparathyroidism of renal origin: Secondary | ICD-10-CM | POA: Diagnosis not present

## 2022-09-21 DIAGNOSIS — Z992 Dependence on renal dialysis: Secondary | ICD-10-CM | POA: Diagnosis not present

## 2022-09-21 DIAGNOSIS — D631 Anemia in chronic kidney disease: Secondary | ICD-10-CM | POA: Diagnosis not present

## 2022-09-22 ENCOUNTER — Ambulatory Visit: Payer: Medicare Other | Admitting: Orthopedic Surgery

## 2022-09-22 DIAGNOSIS — N186 End stage renal disease: Secondary | ICD-10-CM | POA: Diagnosis not present

## 2022-09-22 DIAGNOSIS — Z992 Dependence on renal dialysis: Secondary | ICD-10-CM | POA: Diagnosis not present

## 2022-09-22 DIAGNOSIS — I129 Hypertensive chronic kidney disease with stage 1 through stage 4 chronic kidney disease, or unspecified chronic kidney disease: Secondary | ICD-10-CM | POA: Diagnosis not present

## 2022-09-23 DIAGNOSIS — N2581 Secondary hyperparathyroidism of renal origin: Secondary | ICD-10-CM | POA: Diagnosis not present

## 2022-09-23 DIAGNOSIS — Z992 Dependence on renal dialysis: Secondary | ICD-10-CM | POA: Diagnosis not present

## 2022-09-23 DIAGNOSIS — N186 End stage renal disease: Secondary | ICD-10-CM | POA: Diagnosis not present

## 2022-09-23 DIAGNOSIS — M868X8 Other osteomyelitis, other site: Secondary | ICD-10-CM | POA: Diagnosis not present

## 2022-09-23 DIAGNOSIS — D631 Anemia in chronic kidney disease: Secondary | ICD-10-CM | POA: Diagnosis not present

## 2022-09-26 ENCOUNTER — Other Ambulatory Visit (HOSPITAL_COMMUNITY): Payer: Self-pay

## 2022-09-26 DIAGNOSIS — D631 Anemia in chronic kidney disease: Secondary | ICD-10-CM | POA: Diagnosis not present

## 2022-09-26 DIAGNOSIS — M868X8 Other osteomyelitis, other site: Secondary | ICD-10-CM | POA: Diagnosis not present

## 2022-09-26 DIAGNOSIS — Z992 Dependence on renal dialysis: Secondary | ICD-10-CM | POA: Diagnosis not present

## 2022-09-26 DIAGNOSIS — N2581 Secondary hyperparathyroidism of renal origin: Secondary | ICD-10-CM | POA: Diagnosis not present

## 2022-09-26 DIAGNOSIS — N186 End stage renal disease: Secondary | ICD-10-CM | POA: Diagnosis not present

## 2022-09-27 ENCOUNTER — Telehealth: Payer: Self-pay

## 2022-09-27 ENCOUNTER — Telehealth: Payer: Self-pay | Admitting: Licensed Clinical Social Worker

## 2022-09-27 ENCOUNTER — Ambulatory Visit: Payer: Self-pay

## 2022-09-27 DIAGNOSIS — M545 Low back pain, unspecified: Secondary | ICD-10-CM

## 2022-09-27 NOTE — Patient Instructions (Signed)
Visit Information  Thank you for taking time to visit with me today. Please don't hesitate to contact me if I can be of assistance to you.   Following are the goals we discussed today:   Goals Addressed               This Visit's Progress     Patient Stated     I am having pain from a recent fall (pt-stated)        Care Coordination Interventions: Placed outbound call to Dr. Sabra Heck, PCP, left a vm for intake nurse to check with Dr. Sabra Heck for approval of in home PT, provided this RN contact number and requested a call back to advise         I need help transferring to another dialysis center (pt-stated)        Care Coordination Interventions: Evaluation of current treatment plan related to ESRD and patient's adherence to plan as established by provider Placed outbound call to [facility], spoke with Denton Ar who advised this clinic is currently full  Determined patient has been added to the wait list for a transfer into this clinic        Our next appointment is by telephone on 10/04/22 at 1230 PM  Please call the care guide team at [phone] if you need to cancel or reschedule your appointment.   If you are experiencing a Mental Health or [place] or need someone to talk to, please call 1-800-273-TALK (toll free, 24 hour hotline)  The patient verbalized understanding of instructions, educational materials, and care plan provided today and agreed to receive a mailed copy of patient instructions, educational materials, and care plan.   Barb Merino, RN, BSN, CCM Care Management Coordinator [facility] Care Management Direct Phone: [phone]

## 2022-09-27 NOTE — Patient Outreach (Addendum)
  Care Coordination   09/27/2022 Name: Jacob [name] MRN: [id number] DOB: [birth year]   Care Coordination Outreach Attempts:  An unsuccessful telephone outreach was attempted today to offer the patient information about available care coordination services as a benefit of their health plan. LCSW collaborated with [facility] RN on care coordination team about strategies to strengthen support services.  Follow Up Plan:  Additional outreach attempts will be made to offer the patient care coordination information and services.   Encounter Outcome:  No Answer   Care Coordination Interventions:  No, not indicated    Christa See, MSW, [email or web address] Phone [phone] 3:59 PM

## 2022-09-27 NOTE — Telephone Encounter (Signed)
Okay to try PT

## 2022-09-27 NOTE — Patient Outreach (Addendum)
  Care Coordination   Follow up  Visit Note   09/27/2022 Name: Jacob [name] MRN: [id number] DOB: October 09, 1951  GUENTHER DUNSHEE is a 71 y.o. year old male who sees [name], MD for primary care. I spoke with [facility] today.   What matters to the patients health and wellness today?  Patient requested a referral for outpatient PT. He would like to transfer to a different kidney center to receive his dialysis treatments.     Goals Addressed               This Visit's Progress     Patient Stated     I am having pain from a recent fall (pt-stated)        Care Coordination Interventions: Placed outbound call to Dr. Sabra Heck, PCP, left a vm for intake nurse to check with Dr. Sabra Heck for approval of in home PT, provided this RN contact number and requested a call back to advise         I need help transferring to another dialysis center (pt-stated)        Care Coordination Interventions: Evaluation of current treatment plan related to ESRD and patient's adherence to plan as established by provider Placed outbound call to [facility], spoke with Denton Ar who advised this clinic is currently full  Determined patient has been added to the wait list for a transfer into this clinic        SDOH assessments and interventions completed:  No     Care Coordination Interventions:  Yes, provided   Follow up plan: Follow up call scheduled for 10/04/22 '@1230'$  PM    Encounter Outcome:  Pt. Visit Completed

## 2022-09-27 NOTE — Telephone Encounter (Signed)
Glenard Haring, representative with [facility] called stating she called on 09/20/2022 and shared that patient would like outpatient physical therapy for strengthening and endurance. Glenard Haring was calling to see if this request was sent and followed through by [place].  I am unable to locate documentation of Glenard Haring calling on 11/28/203, however I will send this request to [place] now to address.

## 2022-09-28 ENCOUNTER — Telehealth: Payer: Self-pay

## 2022-09-28 DIAGNOSIS — N2581 Secondary hyperparathyroidism of renal origin: Secondary | ICD-10-CM | POA: Diagnosis not present

## 2022-09-28 DIAGNOSIS — Z992 Dependence on renal dialysis: Secondary | ICD-10-CM | POA: Diagnosis not present

## 2022-09-28 DIAGNOSIS — D631 Anemia in chronic kidney disease: Secondary | ICD-10-CM | POA: Diagnosis not present

## 2022-09-28 DIAGNOSIS — M868X8 Other osteomyelitis, other site: Secondary | ICD-10-CM | POA: Diagnosis not present

## 2022-09-28 DIAGNOSIS — N186 End stage renal disease: Secondary | ICD-10-CM | POA: Diagnosis not present

## 2022-09-28 DIAGNOSIS — R0602 Shortness of breath: Secondary | ICD-10-CM | POA: Diagnosis not present

## 2022-09-28 NOTE — Telephone Encounter (Signed)
[facility] representative spoke with [place] customer service and it was determined that specimen collection was discarded after 7 days and no reply from our office on clarification on the test code for specimen .  See lab tab for TIQ-NTM form that was sent possible to Dr.Miller's inbasket.   Due to the multiple issues with patient and [facility], I will turn this situation over practice administer, Nuala Alpha

## 2022-09-28 NOTE — Telephone Encounter (Signed)
Patient called to question the results of his oral drug screen collected on 09/20/22. I reviewed chart and was unable to locate results. I had a verbal conversation with the [facility] lab tech, will research and provide me with an update. Patient is aware I will call him with a status update once I receive one.  Awaiting response

## 2022-09-29 ENCOUNTER — Ambulatory Visit (INDEPENDENT_AMBULATORY_CARE_PROVIDER_SITE_OTHER): Payer: Medicare Other | Admitting: Podiatry

## 2022-09-29 ENCOUNTER — Telehealth: Payer: Self-pay

## 2022-09-29 DIAGNOSIS — M79674 Pain in right toe(s): Secondary | ICD-10-CM | POA: Diagnosis not present

## 2022-09-29 DIAGNOSIS — M86471 Chronic osteomyelitis with draining sinus, right ankle and foot: Secondary | ICD-10-CM

## 2022-09-29 DIAGNOSIS — M79675 Pain in left toe(s): Secondary | ICD-10-CM | POA: Diagnosis not present

## 2022-09-29 DIAGNOSIS — B351 Tinea unguium: Secondary | ICD-10-CM | POA: Diagnosis not present

## 2022-09-29 DIAGNOSIS — E1142 Type 2 diabetes mellitus with diabetic polyneuropathy: Secondary | ICD-10-CM

## 2022-09-29 NOTE — Telephone Encounter (Signed)
Patient called complaining of chest congestion and leg swelling. Patient reports no SOB or other symptoms. Patient advised to reach out to his pcp to be evaluated and if worsening symptoms ED.  Jacob [name] T Brooks Sailors

## 2022-09-29 NOTE — Progress Notes (Signed)
  Subjective:  [name], male    DOB: December 03, 1950,  MRN: [id number]  Chief Complaint  Patient presents with   Wound Check    Follow up right foot ulcer   Nail Problem    Nail trim     DOS: July 20, 2022 Procedure: Right foot resection of fourth metatarsal head and base of proximal phalanx of the fourth toe, irrigation and debridement of plantar soft tissue ulceration  71 y.o. male returns for follow up on Right foot surgery as well as wound check right foot.  He states that the wounds have now completely healed on the right foot.  He is no longer going to wound care.  He denies any drainage redness or swelling in the right foot.  He also reports pain due to thickening and elongation of his nails present x 5 on both feet.   Review of Systems: Negative except as noted in the HPI. Denies N/V/F/Ch.   Objective:  There were no vitals filed for this visit. There is no height or weight on file to calculate BMI. Constitutional Well developed. Well nourished.  Vascular Foot warm and well perfused. Capillary refill normal to all digits.  Calf is soft and supple, no posterior calf or knee pain, negative Homans' sign  Neurologic Normal speech. Oriented to person, place, and time. Epicritic sensation to light touch grossly present bilaterally.  Dermatologic Skin well-healed without signs of infection.  There is a small dorsal eschar on the right foot that is continuing to close then.  The plantar wound previously seen on the right plantar forefoot has now healed with fibrotic tissue and scar tissue present but no open or draining wound.  Nails thickened elongated dystrophic x 10.          Orthopedic: Tenderness to palpation noted about the surgical site.   Multiple view plain film radiographs: Deferred at this visit Assessment:   1. Pain due to onychomycosis of toenails of both feet   2. Chronic osteomyelitis of right foot with draining sinus (HCC)   3. DM type 2  with diabetic peripheral neuropathy ([place])       Plan:  Patient was evaluated and treated and all questions answered.  S/p foot surgery right fourth MPJ resection with closure of the dorsal incision and irrigation debridement of plantar right foot wound, improved after resection of infected bone wounds dorsal and plantar healing very well -Clinically resolved in regards to the plantar forefoot ulceration subfourth met head.  No draining sinus or signs of residual osteomyelitis at this time.  There is a small dorsal ulceration is continuing to heal. -XR: Deferred at this visit  -WB Status: Weightbearing as tolerated in regular shoes -Medications: No indication for further antibiotics at this time -Placed adhesive bandage with Betadine over the small dorsal eschar present. -Call with any questions or concerns regarding the right foot surgical site however it is nearly completely healed at this time and I do not feel there is residual infection  #Onychomycosis with pain  -Nails palliatively debrided as below. -Educated on self-care  Procedure: Nail Debridement Rationale: Pain Type of Debridement: manual, sharp debridement. Instrumentation: Nail nipper, rotary burr. Number of Nails: 10  Return in about 3 months (around 12/29/2022) for [facility].         Everitt Amber, DPM [facility]

## 2022-09-29 NOTE — Telephone Encounter (Signed)
Per after hours conversation between ronda [facility]) and [employer]), test was not discarded and is still in process.  I called patient and gave him a status update

## 2022-09-30 ENCOUNTER — Ambulatory Visit: Payer: Medicare Other | Admitting: Adult Health

## 2022-09-30 ENCOUNTER — Encounter (HOSPITAL_COMMUNITY): Payer: Self-pay | Admitting: Internal Medicine

## 2022-09-30 ENCOUNTER — Emergency Department (HOSPITAL_COMMUNITY): Payer: Medicare Other

## 2022-09-30 ENCOUNTER — Emergency Department (HOSPITAL_BASED_OUTPATIENT_CLINIC_OR_DEPARTMENT_OTHER): Payer: Medicare Other

## 2022-09-30 ENCOUNTER — Observation Stay (HOSPITAL_COMMUNITY)
Admission: EM | Admit: 2022-09-30 | Discharge: 2022-10-01 | Disposition: A | Discharge status: Home / Self Care | Payer: Medicare Other | Attending: Internal Medicine | Admitting: Internal Medicine

## 2022-09-30 ENCOUNTER — Other Ambulatory Visit: Payer: Self-pay

## 2022-09-30 DIAGNOSIS — E1142 Type 2 diabetes mellitus with diabetic polyneuropathy: Secondary | ICD-10-CM

## 2022-09-30 DIAGNOSIS — M79609 Pain in unspecified limb: Secondary | ICD-10-CM

## 2022-09-30 DIAGNOSIS — D631 Anemia in chronic kidney disease: Secondary | ICD-10-CM | POA: Insufficient documentation

## 2022-09-30 DIAGNOSIS — N186 End stage renal disease: Secondary | ICD-10-CM

## 2022-09-30 DIAGNOSIS — K921 Melena: Secondary | ICD-10-CM | POA: Diagnosis not present

## 2022-09-30 DIAGNOSIS — M7989 Other specified soft tissue disorders: Secondary | ICD-10-CM

## 2022-09-30 DIAGNOSIS — F17210 Nicotine dependence, cigarettes, uncomplicated: Secondary | ICD-10-CM | POA: Diagnosis not present

## 2022-09-30 DIAGNOSIS — E1122 Type 2 diabetes mellitus with diabetic chronic kidney disease: Secondary | ICD-10-CM | POA: Insufficient documentation

## 2022-09-30 DIAGNOSIS — E1151 Type 2 diabetes mellitus with diabetic peripheral angiopathy without gangrene: Secondary | ICD-10-CM | POA: Diagnosis not present

## 2022-09-30 DIAGNOSIS — J449 Chronic obstructive pulmonary disease, unspecified: Secondary | ICD-10-CM | POA: Diagnosis not present

## 2022-09-30 DIAGNOSIS — E782 Mixed hyperlipidemia: Secondary | ICD-10-CM | POA: Diagnosis not present

## 2022-09-30 DIAGNOSIS — K5520 Angiodysplasia of colon without hemorrhage: Secondary | ICD-10-CM

## 2022-09-30 DIAGNOSIS — I255 Ischemic cardiomyopathy: Secondary | ICD-10-CM | POA: Diagnosis present

## 2022-09-30 DIAGNOSIS — R0602 Shortness of breath: Secondary | ICD-10-CM | POA: Diagnosis not present

## 2022-09-30 DIAGNOSIS — B20 Human immunodeficiency virus [HIV] disease: Secondary | ICD-10-CM | POA: Diagnosis not present

## 2022-09-30 DIAGNOSIS — Z992 Dependence on renal dialysis: Secondary | ICD-10-CM | POA: Insufficient documentation

## 2022-09-30 DIAGNOSIS — I5043 Acute on chronic combined systolic (congestive) and diastolic (congestive) heart failure: Secondary | ICD-10-CM | POA: Diagnosis present

## 2022-09-30 DIAGNOSIS — I5042 Chronic combined systolic (congestive) and diastolic (congestive) heart failure: Secondary | ICD-10-CM | POA: Insufficient documentation

## 2022-09-30 DIAGNOSIS — Z951 Presence of aortocoronary bypass graft: Secondary | ICD-10-CM | POA: Insufficient documentation

## 2022-09-30 DIAGNOSIS — I10 Essential (primary) hypertension: Secondary | ICD-10-CM | POA: Diagnosis present

## 2022-09-30 DIAGNOSIS — R2242 Localized swelling, mass and lump, left lower limb: Secondary | ICD-10-CM | POA: Diagnosis present

## 2022-09-30 DIAGNOSIS — K31811 Angiodysplasia of stomach and duodenum with bleeding: Secondary | ICD-10-CM | POA: Diagnosis not present

## 2022-09-30 DIAGNOSIS — D500 Iron deficiency anemia secondary to blood loss (chronic): Secondary | ICD-10-CM | POA: Diagnosis not present

## 2022-09-30 DIAGNOSIS — K922 Gastrointestinal hemorrhage, unspecified: Principal | ICD-10-CM | POA: Diagnosis present

## 2022-09-30 DIAGNOSIS — J811 Chronic pulmonary edema: Secondary | ICD-10-CM | POA: Diagnosis not present

## 2022-09-30 DIAGNOSIS — I2510 Atherosclerotic heart disease of native coronary artery without angina pectoris: Secondary | ICD-10-CM | POA: Diagnosis present

## 2022-09-30 DIAGNOSIS — I132 Hypertensive heart and chronic kidney disease with heart failure and with stage 5 chronic kidney disease, or end stage renal disease: Secondary | ICD-10-CM | POA: Diagnosis not present

## 2022-09-30 DIAGNOSIS — E119 Type 2 diabetes mellitus without complications: Secondary | ICD-10-CM

## 2022-09-30 DIAGNOSIS — Z79899 Other long term (current) drug therapy: Secondary | ICD-10-CM | POA: Insufficient documentation

## 2022-09-30 DIAGNOSIS — Z794 Long term (current) use of insulin: Secondary | ICD-10-CM

## 2022-09-30 DIAGNOSIS — Q2730 Arteriovenous malformation, site unspecified: Secondary | ICD-10-CM

## 2022-09-30 DIAGNOSIS — R109 Unspecified abdominal pain: Secondary | ICD-10-CM | POA: Diagnosis not present

## 2022-09-30 DIAGNOSIS — Z743 Need for continuous supervision: Secondary | ICD-10-CM | POA: Diagnosis not present

## 2022-09-30 DIAGNOSIS — R2243 Localized swelling, mass and lump, lower limb, bilateral: Secondary | ICD-10-CM | POA: Diagnosis not present

## 2022-09-30 DIAGNOSIS — K5730 Diverticulosis of large intestine without perforation or abscess without bleeding: Secondary | ICD-10-CM | POA: Diagnosis not present

## 2022-09-30 DIAGNOSIS — N200 Calculus of kidney: Secondary | ICD-10-CM | POA: Diagnosis not present

## 2022-09-30 LAB — CBC WITH DIFFERENTIAL/PLATELET
Abs Immature Granulocytes: 0.01 10*3/uL (ref 0.00–0.07)
Basophils Absolute: 0 10*3/uL (ref 0.0–0.1)
Basophils Relative: 1 %
Eosinophils Absolute: 0.1 10*3/uL (ref 0.0–0.5)
Eosinophils Relative: 3 %
HCT: 31.4 % — ABNORMAL LOW (ref 39.0–52.0)
Hemoglobin: 10 g/dL — ABNORMAL LOW (ref 13.0–17.0)
Immature Granulocytes: 0 %
Lymphocytes Relative: 27 %
Lymphs Abs: 1.5 10*3/uL (ref 0.7–4.0)
MCH: 34.2 pg — ABNORMAL HIGH (ref 26.0–34.0)
MCHC: 31.8 g/dL (ref 30.0–36.0)
MCV: 107.5 fL — ABNORMAL HIGH (ref 80.0–100.0)
Monocytes Absolute: 0.5 10*3/uL (ref 0.1–1.0)
Monocytes Relative: 10 %
Neutro Abs: 3.4 10*3/uL (ref 1.7–7.7)
Neutrophils Relative %: 59 %
Platelets: 112 10*3/uL — ABNORMAL LOW (ref 150–400)
RBC: 2.92 MIL/uL — ABNORMAL LOW (ref 4.22–5.81)
RDW: 16.2 % — ABNORMAL HIGH (ref 11.5–15.5)
WBC: 5.6 10*3/uL (ref 4.0–10.5)
nRBC: 0 % (ref 0.0–0.2)

## 2022-09-30 LAB — FERRITIN: Ferritin: 182 ng/mL (ref 24–336)

## 2022-09-30 LAB — VITAMIN B12: Vitamin B-12: 463 pg/mL (ref 180–914)

## 2022-09-30 LAB — IRON AND TIBC
Iron: 71 ug/dL (ref 45–182)
Saturation Ratios: 19 % (ref 17.9–39.5)
TIBC: 378 ug/dL (ref 250–450)
UIBC: 307 ug/dL

## 2022-09-30 LAB — COMPREHENSIVE METABOLIC PANEL
ALT: 11 U/L (ref 0–44)
AST: 20 U/L (ref 15–41)
Albumin: 3.1 g/dL — ABNORMAL LOW (ref 3.5–5.0)
Alkaline Phosphatase: 63 U/L (ref 38–126)
Anion gap: 16 — ABNORMAL HIGH (ref 5–15)
BUN: 98 mg/dL — ABNORMAL HIGH (ref 8–23)
CO2: 29 mmol/L (ref 22–32)
Calcium: 9.8 mg/dL (ref 8.9–10.3)
Chloride: 91 mmol/L — ABNORMAL LOW (ref 98–111)
Creatinine, Ser: 10.17 mg/dL — ABNORMAL HIGH (ref 0.61–1.24)
GFR, Estimated: 5 mL/min — ABNORMAL LOW (ref >60)
Glucose, Bld: 186 mg/dL — ABNORMAL HIGH (ref 70–99)
Potassium: 5.4 mmol/L — ABNORMAL HIGH (ref 3.5–5.1)
Sodium: 136 mmol/L (ref 135–145)
Total Bilirubin: 0.6 mg/dL (ref 0.3–1.2)
Total Protein: 7.2 g/dL (ref 6.5–8.1)

## 2022-09-30 LAB — POC OCCULT BLOOD, ED: Fecal Occult Bld: POSITIVE — AB

## 2022-09-30 LAB — PROTIME-INR
INR: 1.1 (ref 0.8–1.2)
Prothrombin Time: 14.2 seconds (ref 11.4–15.2)

## 2022-09-30 LAB — RETICULOCYTES
Immature Retic Fract: 12.9 % (ref 2.3–15.9)
RBC.: 3.12 MIL/uL — ABNORMAL LOW (ref 4.22–5.81)
Retic Count, Absolute: 82.7 10*3/uL (ref 19.0–186.0)
Retic Ct Pct: 2.7 % (ref 0.4–3.1)

## 2022-09-30 LAB — TYPE AND SCREEN
ABO/RH(D): A POS
Antibody Screen: NEGATIVE

## 2022-09-30 LAB — FOLATE: Folate: >40 ng/mL (ref >5.9)

## 2022-09-30 LAB — HEPATITIS B SURFACE ANTIGEN: Hepatitis B Surface Ag: NONREACTIVE

## 2022-09-30 MED ORDER — SODIUM ZIRCONIUM CYCLOSILICATE 10 G PO PACK
10.0000 g | PACK | Freq: Once | ORAL | Status: AC
  Administered 2022-09-30: 10 g via ORAL
  Filled 2022-09-30: qty 1

## 2022-09-30 MED ORDER — DARBEPOETIN ALFA 200 MCG/0.4ML IJ SOSY
200.0000 ug | PREFILLED_SYRINGE | INTRAMUSCULAR | Status: DC
  Administered 2022-09-30: 200 ug via SUBCUTANEOUS
  Filled 2022-09-30 (×2): qty 0.4

## 2022-09-30 MED ORDER — PANTOPRAZOLE SODIUM 40 MG IV SOLR
40.0000 mg | Freq: Two times a day (BID) | INTRAVENOUS | Status: DC
  Administered 2022-09-30 – 2022-10-01 (×2): 40 mg via INTRAVENOUS
  Filled 2022-09-30 (×2): qty 10

## 2022-09-30 MED ORDER — PENTAFLUOROPROP-TETRAFLUOROETH EX AERO: 1.0000 | INHALATION_SPRAY | CUTANEOUS | Status: DC | PRN

## 2022-09-30 MED ORDER — PANTOPRAZOLE SODIUM 40 MG IV SOLR
40.0000 mg | Freq: Once | INTRAVENOUS | Status: AC
  Administered 2022-09-30: 40 mg via INTRAVENOUS
  Filled 2022-09-30: qty 10

## 2022-09-30 MED ORDER — LIDOCAINE-PRILOCAINE 2.5-2.5 % EX CREA: 1.0000 | TOPICAL_CREAM | CUTANEOUS | Status: DC | PRN

## 2022-09-30 MED ORDER — LIDOCAINE HCL (PF) 1 % IJ SOLN: 5.0000 mL | INTRAMUSCULAR | Status: DC | PRN

## 2022-09-30 MED ORDER — OXYCODONE HCL 5 MG PO TABS
5.0000 mg | ORAL_TABLET | Freq: Once | ORAL | Status: AC
  Administered 2022-09-30: 5 mg via ORAL
  Filled 2022-09-30: qty 1

## 2022-09-30 NOTE — Consult Note (Signed)
[facility] Renal Consultation Note    Indication for Consultation:  Management of ESRD/hemodialysis, anemia, hypertension/volume, and secondary hyperparathyroidism. PCP:  HPI: Jacob [name] is a 71 y.o. male with ESRD, CAD s/p CABG, COPD, HTN, HIV, T2DM and Hx GIB is being admitted with melena.  Presented to ED overnight with concern for abdominal pain, L leg edema, and dark stools which has been ongoing for a few days. No fever or chills. No CP or dyspnea. No vomiting. Vitals were ok - afebrile, good O2 sats. Labs with Na 136, K 5.4, Glu 186, BUN 98, Cr 10, Ca 9.8, WBC 5.6, Hgb 10, FOBT positive. Abd CT without acute findings. CXR with ^ vascular congestion. Doppler US L leg negative for DVT. He was admitted for evaluation for GI bleed and we were consulted for dialysis.  Seen in ED bed  - c/o abd pain, mostly in R upper side. No N/V/D.   Reviewing outpatient records: Hgb trend as follows: 11.5 (12/6), 11.1 (11/29), 10.1 (11/21), 9.6 (11/15), 9.7 (11/8)  Dialyzes on MWF schedule at [facility]. He is concerned that hasn't been getting enough fluid removed with dialysis. Has not missed his HD, meets his dry weight, although dry weight likely needs to be adjusted down. Denies issues with his R forearm AVG.  Past Medical History:  Diagnosis Date   Acute respiratory failure ([place]) 03/01/2018   Anemia    Arthritis    "all over; mostly knees and back" (02/28/2018)   Chronic combined systolic and diastolic CHF (congestive heart failure) (HCC)    Chronic lower back pain    stenosis   Community acquired pneumonia 09/06/2013   COPD (chronic obstructive pulmonary disease) ([place])    Coronary atherosclerosis of native coronary artery    a. 02/2003 s/p CABG x 2 (VG->RI, VG->RPDA; b. 11/2019 PCI: LM nl, LAD 90d, D3 50, RI 100, LCX 100p, OM3 100 - fills via L->L collats from D2/dLAD, RCA 100p, VG->RPDA ok, VG->RI 95 (3.5x48 Synergy XD DES).   Drug abuse ([place])    hx; tested for cocaine as  recently as 2/08. says he is not using drugs now - avoided defib. for this reason    ESRD (end stage renal disease) ([place])    [place] M-W-F- [facility]   Fall at home 10/2020   GERD (gastroesophageal reflux disease)    takes OTC meds as needed   GI bleeding    a. 11/2019 EGD: angiodysplastic lesions w/ bleeding s/p argon plasma/clipping/epi inj. Multiple admissions for the same.   Glaucoma    uses eye drops daily   Hepatitis B 1968   "tx'd w/isolation; caught it from toilet stools in gym"   History of blood transfusion 03/01/2019   History of colon polyps    benign   History of gout    takes Allopurinol daily as well as Colchicine-if needed (02/28/2018)   History of kidney stones    HTN (hypertension)    takes Coreg,Imdur.and Apresoline daily   Human immunodeficiency virus (HIV) disease ([place]) dx'd 1995   on [place] as of 12/2020.     Hyperlipidemia    Ischemic cardiomyopathy    a. 01/2019 Echo: EF 40-45%, diffuse HK, mild basal septal hypertrophy. Diast dysfxn. Nl RV size/fxn. Sev dil LA. Triv MR/TR/PR.   Muscle spasm    takes Zanaflex as needed   Myocardial infarction ([place]) ~ 2004/2005   Nocturia    Peripheral neuropathy    takes gabapentin daily   Pneumonia    "at  least twice" (02/28/2018)   SDH (subdural hematoma) (HCC)    Syphilis, unspecified    Type II diabetes mellitus ([place]) 2004   Lantus daily.Average fasting blood sugar 125-199   Wears glasses    Wears partial dentures    Past Surgical History:  Procedure Laterality Date   AV FISTULA PLACEMENT Left 08/02/2018   Procedure: ARTERIOVENOUS (AV) FISTULA CREATION  left arm radiocephlic;  Surgeon: Marty Heck, MD;  Location: [place];  Service: Vascular;  Laterality: Left;   AV FISTULA PLACEMENT Left 08/01/2019   Procedure: LEFT BRACHIOCEPHALIC ARTERIOVENOUS (AV) FISTULA CREATION;  Surgeon: [name], MD;  Location: [place];  Service: Vascular;  Laterality: Left;   AV FISTULA PLACEMENT Right 04/12/2022   Procedure: RIGHT  UPPER EXTREMITY ARTERIOVENOUS (AV)  GRAFT INSERTION USING GORE STRETCH 4-7 MM;  Surgeon: Angelia Mould, MD;  Location: [place];  Service: Vascular;  Laterality: Right;   [place] Left 10/03/2019   Procedure: BASILIC VEIN TRANSPOSITION LEFT SECOND STAGE;  Surgeon: [name], MD;  Location: [place];  Service: Vascular;  Laterality: Left;   BIOPSY  01/25/2021   Procedure: BIOPSY;  Surgeon: Doran Stabler, MD;  Location: [facility] ENDOSCOPY;  Service: Gastroenterology;;   CARDIAC CATHETERIZATION  10/2002; 12/19/2004   [name] 03/08/2011   COLONOSCOPY  2013   [place]    CORONARY ARTERY BYPASS GRAFT  02/24/2003   CABG X2/notes 03/08/2011   CORONARY STENT INTERVENTION N/A 12/19/2019   Procedure: CORONARY STENT INTERVENTION;  Surgeon: Jettie Booze, MD;  Location: [place] CV LAB;  Service: Cardiovascular;  Laterality: N/A;   ENTEROSCOPY N/A 01/25/2021   Procedure: ENTEROSCOPY;  Surgeon: Doran Stabler, MD;  Location: [place];  Service: Gastroenterology;  Laterality: N/A;   ENTEROSCOPY N/A 02/13/2021   Procedure: ENTEROSCOPY;  Surgeon: Jackquline Denmark, MD;  Location: [facility] ENDOSCOPY;  Service: Endoscopy;  Laterality: N/A;   ENTEROSCOPY N/A 05/07/2021   Procedure: ENTEROSCOPY;  Surgeon: Yetta Flock, MD;  Location: [facility] ENDOSCOPY;  Service: Gastroenterology;  Laterality: N/A;   ESOPHAGOGASTRODUODENOSCOPY (EGD) WITH PROPOFOL N/A 02/08/2019   Procedure: ESOPHAGOGASTRODUODENOSCOPY (EGD) WITH PROPOFOL;  Surgeon: Milus Banister, MD;  Location: [place];  Service: Gastroenterology;  Laterality: N/A;   ESOPHAGOGASTRODUODENOSCOPY (EGD) WITH PROPOFOL N/A 12/22/2019   Procedure: ESOPHAGOGASTRODUODENOSCOPY (EGD) WITH PROPOFOL;  Surgeon: Lavena Bullion, DO;  Location: [place];  Service: Gastroenterology;  Laterality: N/A;   ESOPHAGOGASTRODUODENOSCOPY (EGD) WITH PROPOFOL N/A 10/19/2020   Procedure: ESOPHAGOGASTRODUODENOSCOPY (EGD) WITH PROPOFOL;  Surgeon: Jackquline Denmark, MD;  Location: [facility] ENDOSCOPY;  Service: Endoscopy;  Laterality: N/A;   ESOPHAGOGASTRODUODENOSCOPY (EGD) WITH PROPOFOL N/A 12/22/2020   Procedure: ESOPHAGOGASTRODUODENOSCOPY (EGD) WITH PROPOFOL;  Surgeon: Gatha Mayer, MD;  Location: [place];  Service: Endoscopy;  Laterality: N/A;   ESOPHAGOGASTRODUODENOSCOPY (EGD) WITH PROPOFOL N/A 01/09/2021   Procedure: ESOPHAGOGASTRODUODENOSCOPY (EGD) WITH PROPOFOL;  Surgeon: Irene Shipper, MD;  Location: [facility] ENDOSCOPY;  Service: Endoscopy;  Laterality: N/A;   HEMOSTASIS CLIP PLACEMENT  12/22/2019   Procedure: HEMOSTASIS CLIP PLACEMENT;  Surgeon: Lavena Bullion, DO;  Location: [place] ENDOSCOPY;  Service: Gastroenterology;;   HEMOSTASIS CLIP PLACEMENT  12/22/2020   Procedure: HEMOSTASIS CLIP PLACEMENT;  Surgeon: Gatha Mayer, MD;  Location: [facility] ENDOSCOPY;  Service: Endoscopy;;   HEMOSTASIS CONTROL  12/22/2020   Procedure: HEMOSTASIS CONTROL/hemospray;  Surgeon: Gatha Mayer, MD;  Location: [place];  Service: Endoscopy;;   HOT HEMOSTASIS N/A 02/08/2019   Procedure: HOT HEMOSTASIS (ARGON PLASMA COAGULATION/BICAP);  Surgeon: Ardis Hughs,  Melene Plan, MD;  Location: [facility] ENDOSCOPY;  Service: Gastroenterology;  Laterality: N/A;   HOT HEMOSTASIS N/A 12/22/2019   Procedure: HOT HEMOSTASIS (ARGON PLASMA COAGULATION/BICAP);  Surgeon: Lavena Bullion, DO;  Location: [facility] ENDOSCOPY;  Service: Gastroenterology;  Laterality: N/A;   HOT HEMOSTASIS N/A 10/19/2020   Procedure: HOT HEMOSTASIS (ARGON PLASMA COAGULATION/BICAP);  Surgeon: Jackquline Denmark, MD;  Location: [facility] ENDOSCOPY;  Service: Endoscopy;  Laterality: N/A;   HOT HEMOSTASIS N/A 12/22/2020   Procedure: HOT HEMOSTASIS (ARGON PLASMA COAGULATION/BICAP);  Surgeon: Gatha Mayer, MD;  Location: [facility] ENDOSCOPY;  Service: Endoscopy;  Laterality: N/A;   HOT HEMOSTASIS N/A 01/09/2021   Procedure: HOT HEMOSTASIS (ARGON PLASMA COAGULATION/BICAP);  Surgeon: Irene Shipper, MD;  Location: [facility] ENDOSCOPY;  Service: Endoscopy;   Laterality: N/A;   HOT HEMOSTASIS N/A 01/25/2021   Procedure: HOT HEMOSTASIS (ARGON PLASMA COAGULATION/BICAP);  Surgeon: Doran Stabler, MD;  Location: [place];  Service: Gastroenterology;  Laterality: N/A;   HOT HEMOSTASIS N/A 02/13/2021   Procedure: HOT HEMOSTASIS (ARGON PLASMA COAGULATION/BICAP);  Surgeon: Jackquline Denmark, MD;  Location: [facility] ENDOSCOPY;  Service: Endoscopy;  Laterality: N/A;   HOT HEMOSTASIS N/A 05/07/2021   Procedure: HOT HEMOSTASIS (ARGON PLASMA COAGULATION/BICAP);  Surgeon: Yetta Flock, MD;  Location: [facility] ENDOSCOPY;  Service: Gastroenterology;  Laterality: N/A;   INSERTION OF DIALYSIS CATHETER N/A 02/08/2022   Procedure: INSERTION OF TUNNELED DIALYSIS CATHETER;  Surgeon: Angelia Mould, MD;  Location: [place];  Service: Vascular;  Laterality: N/A;   INTERTROCHANTERIC HIP FRACTURE SURGERY Left 11/2006   [name] 03/08/2011   INTRAVASCULAR ULTRASOUND/IVUS N/A 12/19/2019   Procedure: Intravascular Ultrasound/IVUS;  Surgeon: Jettie Booze, MD;  Location: [place] CV LAB;  Service: Cardiovascular;  Laterality: N/A;   IR FLUORO GUIDE CV LINE LEFT  03/14/2022   IR FLUORO GUIDE CV LINE RIGHT  07/24/2019   IR FLUORO GUIDE CV LINE RIGHT  07/30/2019   IR US GUIDE VASC ACCESS RIGHT  07/24/2019   IR US GUIDE VASC ACCESS RIGHT  07/30/2019   LAPAROSCOPIC CHOLECYSTECTOMY  05/2006   LIGATION OF ARTERIOVENOUS  FISTULA Left 02/08/2022   Procedure: LIGATION OF LEFT ARTERIOVENOUS  FISTULA;  Surgeon: Angelia Mould, MD;  Location: [place];  Service: Vascular;  Laterality: Left;   LIGATION OF COMPETING BRANCHES OF ARTERIOVENOUS FISTULA Left 11/05/2018   Procedure: LIGATION OF COMPETING BRANCHES OF ARTERIOVENOUS FISTULA  LEFT  ARM;  Surgeon: Marty Heck, MD;  Location: [facility];  Service: Vascular;  Laterality: Left;   LUMBAR LAMINECTOMY/DECOMPRESSION MICRODISCECTOMY N/A 02/29/2016   Procedure: Left L4-5 Lateral Recess Decompression, Removal Extradural Intraspinal Facet  Cyst;  Surgeon: Marybelle Killings, MD;  Location: [place];  Service: Orthopedics;  Laterality: N/A;   METATARSAL HEAD EXCISION Right 07/20/2022   Procedure: Irragation and debridement of right foot wound with extraction of all necrotic soft tissue and bone;  Surgeon: Yevonne Pax, DPM;  Location: [place];  Service: Podiatry;  Laterality: Right;  Right 4th Met head and base of proximal phalanx resection   MULTIPLE TOOTH EXTRACTIONS     ORIF MANDIBULAR FRACTURE Left 08/13/2004   ORIF of left body fracture mandible with KLS Martin 2.3-mm six hole/notes 03/08/2011   RIGHT/LEFT HEART CATH AND CORONARY/GRAFT ANGIOGRAPHY N/A 12/19/2019   Procedure: RIGHT/LEFT HEART CATH AND CORONARY/GRAFT ANGIOGRAPHY;  Surgeon: Jettie Booze, MD;  Location: [place] CV LAB;  Service: Cardiovascular;  Laterality: N/A;   SCLEROTHERAPY  12/22/2019   Procedure: SCLEROTHERAPY;  Surgeon: Lavena Bullion, DO;  Location:  [facility] ENDOSCOPY;  Service: Gastroenterology;;   Clide Deutscher  02/13/2021   Procedure: Clide Deutscher;  Surgeon: Jackquline Denmark, MD;  Location: [facility] ENDOSCOPY;  Service: Endoscopy;;   Family History  Problem Relation Age of Onset   Heart failure Father    Hypertension Father    Diabetes Brother    Heart attack Brother    Alzheimer's disease Mother    Stroke Sister    Diabetes Sister    Alzheimer's disease Sister    Hypertension Brother    Diabetes Brother    Drug abuse Brother    Colon cancer Neg Hx    Social History:  reports that he has been smoking cigarettes. He has a 21.50 pack-year smoking history. He has never been exposed to tobacco smoke. He has never used smokeless tobacco. He reports current alcohol use of about 12.0 standard drinks of alcohol per week. He reports that he does not currently use drugs after having used the following drugs: Cocaine.  ROS: As per HPI otherwise negative.  Physical Exam: Vitals:   09/30/22 0722 09/30/22 0915 09/30/22 1131 09/30/22 1230  BP:  121/67 (!)  125/50 108/61  Pulse:  65 (!) 59 (!) 59  Resp:  _0 Temp: (!) 97.4 F (36.3 C)  (!) 97.5 F (36.4 C)   TempSrc: Oral  Oral   SpO2:  98% 100% 100%     General: Well developed, well nourished, in no acute distress. Room air. Head: Normocephalic, atraumatic, sclera non-icteric, mucus membranes are moist. Neck: Supple without lymphadenopathy/masses. JVD not elevated. Lungs: Clear bilaterally to auscultation without wheezes, rales, or rhonchi. Breathing is unlabored. Heart: RRR with normal S1, S2. No murmurs, rubs, or gallops appreciated. Abdomen: Distended. Tender in RUQ; otherwise ok. BS + Musculoskeletal:  Strength and tone appear normal for age. Lower extremities: 1+ BLE edema (L > R) Neuro: Alert and oriented X 3. Moves all extremities spontaneously. Psych:  Responds to questions appropriately with a normal affect. Dialysis Access: R forearm AVG + bruit  Allergies  Allergen Reactions   Augmentin [Amoxicillin-Pot Clavulanate] Diarrhea and Other (See Comments)    Severe diarrhea   Morphine Sulfate Anaphylaxis    Can't mix with oxycodone    Mucinex Fast-Max Other (See Comments)    Intense sweating    Amphetamines Other (See Comments)    Unknown reaction   Prior to Admission medications   Medication Sig Start Date End Date Taking? Authorizing Provider  acetaminophen (TYLENOL) 650 MG CR tablet Take 1,300 mg by mouth 3 (three) times daily.   Yes [provider]  allopurinol (ZYLOPRIM) 100 MG tablet Take one tablet by mouth once daily. 05/11/22  Yes [name], MD  atorvastatin (LIPITOR) 10 MG tablet TAKE 1 TABLET(10 MG) BY MOUTH DAILY 02/14/22  Yes [name], MD  BIKTARVY 50-200-25 MG TABS tablet Take 1 tablet by mouth daily. 08/04/22  Yes [name], MD  carvedilol (COREG) 3.125 MG tablet Take 1 tablet (3.125 mg total) by mouth 2 (two) times daily with a meal. 08/10/22  Yes [name], MD  gabapentin (NEURONTIN) 300 MG capsule TAKE 1 CAPSULE BY  MOUTH DAILY. 05/11/22  Yes [name], MD  insulin lispro (HUMALOG) 100 UNIT/ML KwikPen INJECT 20 UNITS UNDER THE SKIN THREE TIMES DAILY AS NEEDED FOR HIGH BLOOD SUGAR(ABOVE 150) Patient taking differently: Inject 15 Units into the skin 3 (three) times daily. INJECT 1 UNITS UNDER THE SKIN THREE TIMES DAILY AS NEEDED FOR HIGH BLOOD SUGAR(ABOVE 150) 06/09/22  Yes [name], MD  isosorbide mononitrate (IMDUR) 30 MG 24 hr tablet Take 1 tablet (30 mg total) by mouth daily. 05/11/22  Yes [name], MD  lidocaine-prilocaine (EMLA) cream Apply 1 application topically every Monday, Wednesday, and Friday with hemodialysis.   Yes [provider]  Menthol-Camphor (TIGER BALM ARTHRITIS RUB) 11-11 % CREA Apply 1 Application topically daily as needed (pain).   Yes [provider]  nitroGLYCERIN (NITROSTAT) 0.3 MG SL tablet PLACE 1 TABLET UNDER THE TONGUE AS NEEDED FOR CHEST PAIN 03/29/22  Yes [name], MD  oxyCODONE-acetaminophen (PERCOCET) 5-325 MG tablet Take 1 tablet by mouth every 6 (six) hours as needed for severe pain. 09/20/22 09/20/23 Yes Medina-Vargas, Monina C, NP  umeclidinium-vilanterol (ANORO ELLIPTA) 62.5-25 MCG/ACT AEPB INHALE 1 PUFF BY MOUTH EVERY DAY 05/18/22  Yes [name], MD  B-D UF III MINI PEN NEEDLES 31G X 5 MM MISC USE 4 TIMES DAILY 06/06/22   [name], MD  Calcium Acetate 667 MG TABS Take 667-2,001 mg by mouth See admin instructions. Take 2001 mg by mouth three times a day with meals and 667 mg with each snack 01/20/21   [provider]  diclofenac Sodium (VOLTAREN) 1 % GEL Apply 2 grams topically to the affected area three times daily as needed for pain. 08/22/22   [name], MD  docusate sodium (COLACE) 100 MG capsule Take 100 mg by mouth daily as needed for mild constipation. Patient not taking: Reported on 09/08/2022    [provider]  folic acid (FOLVITE) 1 MG tablet TAKE 1 TABLET(1 MG) BY MOUTH DAILY  05/11/22   [name], MD  gentamicin ointment (GARAMYCIN) 0.1 % Apply 1 Application topically daily. 07/14/22   [provider]  glucose blood (ONETOUCH ULTRA) test strip Check blood sugar three times daily E11.40 08/12/22   [name], MD  Lancets (ONETOUCH DELICA PLUS [id number]) [place] USE 3 TIMES DAILY 08/15/22   [name], MD  latanoprost (XALATAN) 0.005 % ophthalmic solution Place 1 drop into both eyes at bedtime.    [provider]  multivitamin (RENA-VIT) TABS tablet Take 1 tablet by mouth daily.    [provider]  octreotide (SANDOSTATIN LAR DEPOT) 10 MG injection Inject 10 mg into the muscle every 28 days. 08/18/22   Irene Shipper, MD  ondansetron (ZOFRAN) 4 MG tablet TAKE 1 TABLET(4 MG) BY MOUTH EVERY 8 HOURS AS NEEDED FOR NAUSEA OR VOMITING 07/25/22   [name], MD  polyethylene glycol (MIRALAX / GLYCOLAX) 17 g packet Take 17 g by mouth daily as needed for mild constipation. 11/07/20   Aline August, MD  timolol (TIMOPTIC) 0.5 % ophthalmic solution Place 1 drop into both eyes in the morning. 04/04/19   [provider]   No current facility-administered medications for this encounter.   Current Outpatient Medications  Medication Sig Dispense Refill   acetaminophen (TYLENOL) 650 MG CR tablet Take 1,300 mg by mouth 3 (three) times daily.     allopurinol (ZYLOPRIM) 100 MG tablet Take one tablet by mouth once daily. 90 tablet 2   atorvastatin (LIPITOR) 10 MG tablet TAKE 1 TABLET(10 MG) BY MOUTH DAILY 90 tablet 2   BIKTARVY 50-200-25 MG TABS tablet Take 1 tablet by mouth daily. 30 tablet 11   carvedilol (COREG) 3.125 MG tablet Take 1 tablet (3.125 mg total) by mouth 2 (two) times daily with a meal. 180 tablet 3   gabapentin (NEURONTIN) 300  MG capsule TAKE 1 CAPSULE BY MOUTH DAILY. 90 capsule 2   insulin lispro (HUMALOG) 100 UNIT/ML KwikPen INJECT 20 UNITS UNDER THE SKIN THREE TIMES DAILY AS NEEDED FOR HIGH BLOOD SUGAR(ABOVE 150)  (Patient taking differently: Inject 15 Units into the skin 3 (three) times daily. INJECT 1 UNITS UNDER THE SKIN THREE TIMES DAILY AS NEEDED FOR HIGH BLOOD SUGAR(ABOVE 150)) 45 mL 1   isosorbide mononitrate (IMDUR) 30 MG 24 hr tablet Take 1 tablet (30 mg total) by mouth daily. 90 tablet 2   lidocaine-prilocaine (EMLA) cream Apply 1 application topically every Monday, Wednesday, and Friday with hemodialysis.     Menthol-Camphor (TIGER BALM ARTHRITIS RUB) 11-11 % CREA Apply 1 Application topically daily as needed (pain).     nitroGLYCERIN (NITROSTAT) 0.3 MG SL tablet PLACE 1 TABLET UNDER THE TONGUE AS NEEDED FOR CHEST PAIN 75 tablet 1   oxyCODONE-acetaminophen (PERCOCET) 5-325 MG tablet Take 1 tablet by mouth every 6 (six) hours as needed for severe pain. 30 tablet 0   umeclidinium-vilanterol (ANORO ELLIPTA) 62.5-25 MCG/ACT AEPB INHALE 1 PUFF BY MOUTH EVERY DAY 60 each 11   B-D UF III MINI PEN NEEDLES 31G X 5 MM MISC USE 4 TIMES DAILY 100 each 11   Calcium Acetate 667 MG TABS Take 667-2,001 mg by mouth See admin instructions. Take 2001 mg by mouth three times a day with meals and 667 mg with each snack     diclofenac Sodium (VOLTAREN) 1 % GEL Apply 2 grams topically to the affected area three times daily as needed for pain. 600 g 1   docusate sodium (COLACE) 100 MG capsule Take 100 mg by mouth daily as needed for mild constipation. (Patient not taking: Reported on 95/62/1308)     folic acid (FOLVITE) 1 MG tablet TAKE 1 TABLET(1 MG) BY MOUTH DAILY 90 tablet 2   gentamicin ointment (GARAMYCIN) 0.1 % Apply 1 Application topically daily.     glucose blood (ONETOUCH ULTRA) test strip Check blood sugar three times daily E11.40 300 strip 3   Lancets (ONETOUCH DELICA PLUS [id number]) MISC USE 3 TIMES DAILY 300 each 3   latanoprost (XALATAN) 0.005 % ophthalmic solution Place 1 drop into both eyes at bedtime.     multivitamin (RENA-VIT) TABS tablet Take 1 tablet by mouth daily.     octreotide (SANDOSTATIN LAR  DEPOT) 10 MG injection Inject 10 mg into the muscle every 28 days. 1 kit 11   ondansetron (ZOFRAN) 4 MG tablet TAKE 1 TABLET(4 MG) BY MOUTH EVERY 8 HOURS AS NEEDED FOR NAUSEA OR VOMITING 20 tablet 1   polyethylene glycol (MIRALAX / GLYCOLAX) 17 g packet Take 17 g by mouth daily as needed for mild constipation. 14 each 0   timolol (TIMOPTIC) 0.5 % ophthalmic solution Place 1 drop into both eyes in the morning.     Labs: Basic Metabolic Panel: Recent Labs  Lab 09/30/22 0259  NA 136  K 5.4*  CL 91*  CO2 29  GLUCOSE 186*  BUN 98*  CREATININE 10.17*  CALCIUM 9.8   Liver Function Tests: Recent Labs  Lab 09/30/22 0259  AST 20  ALT 11  ALKPHOS 63  BILITOT 0.6  PROT 7.2  ALBUMIN 3.1*   CBC: Recent Labs  Lab 09/30/22 0259  WBC 5.6  NEUTROABS 3.4  HGB 10.0*  HCT 31.4*  MCV 107.5*  PLT 112*   Studies/Results: VAS [place] LOWER EXTREMITY VENOUS (DVT) (ONLY [facility] & [facility])  Result Date: 09/30/2022  Lower Venous DVT Study  Patient Name:  Jacob [name]  Date of Exam:   09/30/2022 Medical Rec #: [id number]        Accession #:    [id number] Date of Birth: 1950/11/20         Patient Gender: M Patient Age:   10 years Exam Location:  [facility] Procedure:      VAS [place] LOWER EXTREMITY VENOUS (DVT) Referring Phys: Deno Etienne --------------------------------------------------------------------------------  Indications: Left leg pain and swelling, osteomyelitis.  Limitations: Acoustic shadowing secondary to overlying arterial calcification. Comparison Study: No prior venous studies. Normal ABI 07-20-2022. Performing Technologist: Darlin Coco RDMS, RVT  Examination Guidelines: A complete evaluation includes B-mode imaging, spectral Doppler, color Doppler, and power Doppler as needed of all accessible portions of each vessel. Bilateral testing is considered an integral part of a complete examination. Limited examinations for reoccurring indications may be performed as noted. The reflux portion of  the exam is performed with the patient in reverse Trendelenburg.  +-----+---------------+---------+-----------+----------+--------------+ RIGHTCompressibilityPhasicitySpontaneityPropertiesThrombus Aging +-----+---------------+---------+-----------+----------+--------------+ CFV  Full           Yes      Yes                                 +-----+---------------+---------+-----------+----------+--------------+   +---------+---------------+---------+-----------+----------+--------------+ LEFT     CompressibilityPhasicitySpontaneityPropertiesThrombus Aging +---------+---------------+---------+-----------+----------+--------------+ CFV      Full           Yes      Yes                                 +---------+---------------+---------+-----------+----------+--------------+ SFJ      Full                                                        +---------+---------------+---------+-----------+----------+--------------+ FV Prox  Full                                                        +---------+---------------+---------+-----------+----------+--------------+ FV Mid   Full                                                        +---------+---------------+---------+-----------+----------+--------------+ FV DistalFull                                                        +---------+---------------+---------+-----------+----------+--------------+ PFV      Full                                                        +---------+---------------+---------+-----------+----------+--------------+ POP  Full           Yes      Yes                                 +---------+---------------+---------+-----------+----------+--------------+ PTV      Full                                                        +---------+---------------+---------+-----------+----------+--------------+ PERO     Full                                                         +---------+---------------+---------+-----------+----------+--------------+    Summary: RIGHT: - No evidence of common femoral vein obstruction.  LEFT: - There is no evidence of deep vein thrombosis in the lower extremity.  - No cystic structure found in the popliteal fossa.  *See table(s) above for measurements and observations.    Preliminary    CT ABDOMEN PELVIS WO CONTRAST  Result Date: 09/30/2022 CLINICAL DATA:  There are stools with abdominal distension in left lower leg swelling. Bowel obstruction suspected. EXAM: CT ABDOMEN AND PELVIS WITHOUT CONTRAST TECHNIQUE: Multidetector CT imaging of the abdomen and pelvis was performed following the standard protocol without IV contrast. RADIATION DOSE REDUCTION: This exam was performed according to the departmental dose-optimization program which includes automated exposure control, adjustment of the mA and/or kV according to patient size and/or use of iterative reconstruction technique. COMPARISON:  01/21/2022 FINDINGS: Lower chest: The cardio pericardial silhouette is enlarged. Coronary artery calcification is evident. Hepatobiliary: No suspicious focal abnormality in the liver on this study without intravenous contrast. Gallbladder is surgically absent. Trace pneumobilia is again noted and compatible with prior sphincterotomy. Pancreas: No focal mass lesion. No dilatation of the main duct. No intraparenchymal cyst. No peripancreatic edema. Spleen: No splenomegaly. No focal mass lesion. Adrenals/Urinary Tract: No adrenal nodule or mass. Punctate nonobstructing stone identified posterior interpolar right kidney on 41/3. Vascular calcification noted in each renal hilum. No evidence for hydroureter. The urinary bladder appears normal for the degree of distention. Stomach/Bowel: Stomach is unremarkable. No gastric wall thickening. No evidence of outlet obstruction. Duodenum is normally positioned as is the ligament of Treitz. No small bowel wall thickening. No  small bowel dilatation. The terminal ileum is normal. The appendix is normal. No gross colonic mass. No colonic wall thickening. Diverticular changes are noted in the left colon without evidence of diverticulitis. Vascular/Lymphatic: There is advanced atherosclerotic calcification of the abdominal aorta without aneurysm. There is no gastrohepatic or hepatoduodenal ligament lymphadenopathy. No retroperitoneal or mesenteric lymphadenopathy. No pelvic sidewall lymphadenopathy. Reproductive: The prostate gland and seminal vesicles are unremarkable. Other: No intraperitoneal free fluid. Musculoskeletal: Small left groin hernia contains only fat. Fixation hardware noted left femoral neck. No worrisome lytic or sclerotic osseous abnormality. Degenerative disc disease noted L4-5 and L5-S1. IMPRESSION: 1. No acute findings in the abdomen or pelvis. Specifically, no evidence for bowel obstruction. 2. Left colonic diverticulosis without diverticulitis. 3. Punctate nonobstructing right renal stone. 4. Small left groin hernia contains only fat. 5.  Aortic Atherosclerosis (ICD10-I70.0). Electronically Signed  By: Misty Stanley M.D.   On: 09/30/2022 07:38   DG Chest 1 View  Result Date: 09/30/2022 CLINICAL DATA:  Shortness of breath. EXAM: CHEST  1 VIEW COMPARISON:  07/18/2022. FINDINGS: The heart is enlarged and the mediastinal contour is stable. Atherosclerotic calcification of the aorta is noted. The pulmonary vasculature is distended. Interstitial prominence is present bilaterally. No consolidation, effusion, or pneumothorax. No acute osseous abnormality. IMPRESSION: 1. Cardiomegaly with pulmonary vascular congestion. 2. Interstitial thickening bilaterally, possible edema or infiltrate. Electronically Signed   By: Brett Fairy M.D.   On: 09/30/2022 04:58    Dialysis Orders:  MWF at [facility] 3:45h, 400/A1.5, EDW 84.5kg, 2K/2Ca, R AVG, 16g needles, no heparin - Mircera 260mg IV q 2 weeks (last 11/21).  - Venofer [id number]  HD - Calcitriol 1.36m51mPO q HD - Sensipar 67m59m q HD  Assessment/Plan:  Melena/GI bleed: Hgb 11.5 on 12/6 -> 10 today. FOBT +. Per GI and primary teams.  LLE edema: Truly has BLE edema although L worse - doppler US n[place] for DVT. Will try to get volume off with HD and follow.  ESRD: Continue HD per usual MWF schedule - HD today, no heparin.  Hypertension/volume: BP controlled, UF as tolerated  Anemia of ESRD: See #1 - overdue for ESA, will order for today.  Metabolic bone disease: Ca ok, Phos pending. Restart home meds  HIV  T2DM  CAD/Hx CABG  KatiVeneta Penton-C 09/30/2022, 1:39 PM  [facility]

## 2022-09-30 NOTE — ED Notes (Signed)
Lucas Mallow in room with this RN and patient discussing patient complaints of delay in dialysis, ordered diet, and that security had not secured patients wallet. Lucas Mallow provided patient with security envelope for patients wallet.

## 2022-09-30 NOTE — ED Provider Notes (Signed)
Patient handed off to me by overnight staff.  Here with some left lower leg pain.  DVT study ordered.  He has been having some melanotic stools last couple days.  History of GI bleeds.  History of end-stage renal disease on hemodialysis needing hemodialysis today typically.  Per overnight team patient with grossly melanotic stool with positive Hemoccult.  However hemoglobin around baseline at 10.  Patient states not typical for him to have dark stools despite being on iron.  GI team with [facility] has been consulted.  DVT study has been performed and is negative.  He has had CT scan of the abdomen pelvis is unremarkable.  Nephrology has been consulted and has placed patient on dialysis list.  Overall we will admit to further rule out GI bleed, get patient dialysis.  This chart was dictated using voice recognition software.  Despite best efforts to proofread,  errors can occur which can change the documentation meaning.    Lennice Sites, DO 09/30/22 [phone]

## 2022-09-30 NOTE — Consult Note (Addendum)
Referring Provider: No ref. provider found Primary Care Physician:  [name], MD Primary Gastroenterologist:  Dr. Henrene Pastor  Reason for Consultation:  Heme positive stool; history of AVMs  HPI: Jacob [name] is a 71 y.o. male with a complicated medical history significant for CAD status post CABG, ischemic cardiomyopathy, diabetes, HIV, end-stage renal disease on HD, COPD, recurrent GI bleeding due to AVMs.  He previously had multiple GI procedures but has done well since last procedure in 04/2021 and since starting monthly octreotide.  He came here to the ED on this occasion with complaints of pain and swelling in LLE and black stools that he says he has been seeing for about a week.  Prior to that he had been having normal stools.  No abdominal pain.  During my visit with him he was very drowsy, in and out of sleep, but complaining that he has just been sitting here and nobody is doing anything.  Preliminary read on LE dopplers show no clots.  CT scan of the abdomen and pelvis without contrast:  IMPRESSION: 1. No acute findings in the abdomen or pelvis. Specifically, no evidence for bowel obstruction. 2. Left colonic diverticulosis without diverticulitis. 3. Punctate nonobstructing right renal stone. 4. Small left groin hernia contains only fat. 5.  Aortic Atherosclerosis (ICD10-I70.0).  Hgb is 10.0 grams, which is at or better than his baseline.  Is heme positive.  PREVIOUS ENDOSCOPIC EVALUATIONS / PERTINENT STUDIES    10/19/20 EGD for GI bleed Gastric AVMs s/p APC treatment. - No specimens collected   12/22/19 EGD --bleeding gastric AVMs   April 2020 EGD --Gastric and duodenal bulb AVMs   Dec 2013 screening colonoscopy --moderate diverticulosis --2 mm polyp ( removed but not retrieved)   December 22, 2020 EGD for melena Blood in the duodenal bulb. - Nodule found in the duodenum. steady non-pulsatile bleeding seen at base, very close to distal aspect pylorus so  positioning difficult - ? AVM Treated with argon plasma coagulation (APC) - partially successful Clips (MR conditional) were placed. hemostatic spray applied. No bleeding at end - The examination was otherwise normal.   January 09, 2021 EGD for  GI bleed -  Bleeding gastric AVMs status post successful APC therapy -- Otherwise unremarkable upper endoscopy   January 25, 2021 EGD for anemia / melena -Normal esophagus. - Gastritis. Biopsied. - A few bleeding angioectasias in the duodenum. Treated with argon plasma coagulation (APC). - Nodular mucosa in the duodenal bulb and in the first portion of the duodenum. - The examined portion of the jejunum was normal.   February 13, 2021 EGD - gastric AVM s/p APV, actively bleeding duodenal AVM s/p APC   July 15/2022 Enteroscopy -  Small hiatal hernia. - 15 non-bleeding angiodysplastic lesions in the stomach. Treated with argon plasma coagulation (APC). - Four non-bleeding angiodysplastic lesions in the duodenum. Treated with argon plasma coagulation (APC). - Nodular mucosa in the duodenal bulb. - The examined portion of the jejunum was normal.   Past Medical History:  Diagnosis Date   Acute respiratory failure ([place]) 03/01/2018   Anemia    Arthritis    "all over; mostly knees and back" (02/28/2018)   Chronic combined systolic and diastolic CHF (congestive heart failure) (HCC)    Chronic lower back pain    stenosis   Community acquired pneumonia 09/06/2013   COPD (chronic obstructive pulmonary disease) ([place])    Coronary atherosclerosis of native coronary artery    a. 02/2003 s/p CABG x 2 (  VG->RI, VG->RPDA; b. 11/2019 PCI: LM nl, LAD 90d, D3 50, RI 100, LCX 100p, OM3 100 - fills via L->L collats from D2/dLAD, RCA 100p, VG->RPDA ok, VG->RI 95 (3.5x48 Synergy XD DES).   Drug abuse ([place])    hx; tested for cocaine as recently as 2/08. says he is not using drugs now - avoided defib. for this reason    ESRD (end stage renal disease) ([place])    Hemo M-W-F-  Richarda Blade   Fall at home 10/2020   GERD (gastroesophageal reflux disease)    takes OTC meds as needed   GI bleeding    a. 11/2019 EGD: angiodysplastic lesions w/ bleeding s/p argon plasma/clipping/epi inj. Multiple admissions for the same.   Glaucoma    uses eye drops daily   Hepatitis B 1968   "tx'd w/isolation; caught it from toilet stools in gym"   History of blood transfusion 03/01/2019   History of colon polyps    benign   History of gout    takes Allopurinol daily as well as Colchicine-if needed (02/28/2018)   History of kidney stones    HTN (hypertension)    takes Coreg,Imdur.and Apresoline daily   Human immunodeficiency virus (HIV) disease ([place]) dx'd 1995   on [place] as of 12/2020.     Hyperlipidemia    Ischemic cardiomyopathy    a. 01/2019 Echo: EF 40-45%, diffuse HK, mild basal septal hypertrophy. Diast dysfxn. Nl RV size/fxn. Sev dil LA. Triv MR/TR/PR.   Muscle spasm    takes Zanaflex as needed   Myocardial infarction ([place]) ~ 2004/2005   Nocturia    Peripheral neuropathy    takes gabapentin daily   Pneumonia    "at least twice" (02/28/2018)   SDH (subdural hematoma) (HCC)    Syphilis, unspecified    Type II diabetes mellitus ([place]) 2004   Lantus daily.Average fasting blood sugar 125-199   Wears glasses    Wears partial dentures     Past Surgical History:  Procedure Laterality Date   AV FISTULA PLACEMENT Left 08/02/2018   Procedure: ARTERIOVENOUS (AV) FISTULA CREATION  left arm radiocephlic;  Surgeon: Marty Heck, MD;  Location: [place];  Service: Vascular;  Laterality: Left;   AV FISTULA PLACEMENT Left 08/01/2019   Procedure: LEFT BRACHIOCEPHALIC ARTERIOVENOUS (AV) FISTULA CREATION;  Surgeon: [name], MD;  Location: [place];  Service: Vascular;  Laterality: Left;   AV FISTULA PLACEMENT Right 04/12/2022   Procedure: RIGHT UPPER EXTREMITY ARTERIOVENOUS (AV)  GRAFT INSERTION USING GORE STRETCH 4-7 MM;  Surgeon: Angelia Mould, MD;  Location: [place];   Service: Vascular;  Laterality: Right;   [place] Left 10/03/2019   Procedure: BASILIC VEIN TRANSPOSITION LEFT SECOND STAGE;  Surgeon: [name], MD;  Location: [place];  Service: Vascular;  Laterality: Left;   BIOPSY  01/25/2021   Procedure: BIOPSY;  Surgeon: Doran Stabler, MD;  Location: [facility] ENDOSCOPY;  Service: Gastroenterology;;   CARDIAC CATHETERIZATION  10/2002; 12/19/2004   [name] 03/08/2011   COLONOSCOPY  2013   [place]    CORONARY ARTERY BYPASS GRAFT  02/24/2003   CABG X2/notes 03/08/2011   CORONARY STENT INTERVENTION N/A 12/19/2019   Procedure: CORONARY STENT INTERVENTION;  Surgeon: Jettie Booze, MD;  Location: [place] CV LAB;  Service: Cardiovascular;  Laterality: N/A;   ENTEROSCOPY N/A 01/25/2021   Procedure: ENTEROSCOPY;  Surgeon: Doran Stabler, MD;  Location: [place];  Service: Gastroenterology;  Laterality: N/A;   ENTEROSCOPY N/A 02/13/2021  Procedure: ENTEROSCOPY;  Surgeon: Jackquline Denmark, MD;  Location: [facility] ENDOSCOPY;  Service: Endoscopy;  Laterality: N/A;   ENTEROSCOPY N/A 05/07/2021   Procedure: ENTEROSCOPY;  Surgeon: Yetta Flock, MD;  Location: [facility] ENDOSCOPY;  Service: Gastroenterology;  Laterality: N/A;   ESOPHAGOGASTRODUODENOSCOPY (EGD) WITH PROPOFOL N/A 02/08/2019   Procedure: ESOPHAGOGASTRODUODENOSCOPY (EGD) WITH PROPOFOL;  Surgeon: Milus Banister, MD;  Location: [place];  Service: Gastroenterology;  Laterality: N/A;   ESOPHAGOGASTRODUODENOSCOPY (EGD) WITH PROPOFOL N/A 12/22/2019   Procedure: ESOPHAGOGASTRODUODENOSCOPY (EGD) WITH PROPOFOL;  Surgeon: Lavena Bullion, DO;  Location: [place];  Service: Gastroenterology;  Laterality: N/A;   ESOPHAGOGASTRODUODENOSCOPY (EGD) WITH PROPOFOL N/A 10/19/2020   Procedure: ESOPHAGOGASTRODUODENOSCOPY (EGD) WITH PROPOFOL;  Surgeon: Jackquline Denmark, MD;  Location: [facility] ENDOSCOPY;  Service: Endoscopy;  Laterality: N/A;   ESOPHAGOGASTRODUODENOSCOPY (EGD) WITH PROPOFOL N/A  12/22/2020   Procedure: ESOPHAGOGASTRODUODENOSCOPY (EGD) WITH PROPOFOL;  Surgeon: Gatha Mayer, MD;  Location: [place];  Service: Endoscopy;  Laterality: N/A;   ESOPHAGOGASTRODUODENOSCOPY (EGD) WITH PROPOFOL N/A 01/09/2021   Procedure: ESOPHAGOGASTRODUODENOSCOPY (EGD) WITH PROPOFOL;  Surgeon: Irene Shipper, MD;  Location: [facility] ENDOSCOPY;  Service: Endoscopy;  Laterality: N/A;   HEMOSTASIS CLIP PLACEMENT  12/22/2019   Procedure: HEMOSTASIS CLIP PLACEMENT;  Surgeon: Lavena Bullion, DO;  Location: [place] ENDOSCOPY;  Service: Gastroenterology;;   HEMOSTASIS CLIP PLACEMENT  12/22/2020   Procedure: HEMOSTASIS CLIP PLACEMENT;  Surgeon: Gatha Mayer, MD;  Location: [facility] ENDOSCOPY;  Service: Endoscopy;;   HEMOSTASIS CONTROL  12/22/2020   Procedure: HEMOSTASIS CONTROL/hemospray;  Surgeon: Gatha Mayer, MD;  Location: [place];  Service: Endoscopy;;   HOT HEMOSTASIS N/A 02/08/2019   Procedure: HOT HEMOSTASIS (ARGON PLASMA COAGULATION/BICAP);  Surgeon: Milus Banister, MD;  Location: [facility] ENDOSCOPY;  Service: Gastroenterology;  Laterality: N/A;   HOT HEMOSTASIS N/A 12/22/2019   Procedure: HOT HEMOSTASIS (ARGON PLASMA COAGULATION/BICAP);  Surgeon: Lavena Bullion, DO;  Location: [facility] ENDOSCOPY;  Service: Gastroenterology;  Laterality: N/A;   HOT HEMOSTASIS N/A 10/19/2020   Procedure: HOT HEMOSTASIS (ARGON PLASMA COAGULATION/BICAP);  Surgeon: Jackquline Denmark, MD;  Location: [facility] ENDOSCOPY;  Service: Endoscopy;  Laterality: N/A;   HOT HEMOSTASIS N/A 12/22/2020   Procedure: HOT HEMOSTASIS (ARGON PLASMA COAGULATION/BICAP);  Surgeon: Gatha Mayer, MD;  Location: [facility] ENDOSCOPY;  Service: Endoscopy;  Laterality: N/A;   HOT HEMOSTASIS N/A 01/09/2021   Procedure: HOT HEMOSTASIS (ARGON PLASMA COAGULATION/BICAP);  Surgeon: Irene Shipper, MD;  Location: [facility] ENDOSCOPY;  Service: Endoscopy;  Laterality: N/A;   HOT HEMOSTASIS N/A 01/25/2021   Procedure: HOT HEMOSTASIS (ARGON PLASMA COAGULATION/BICAP);  Surgeon: Doran Stabler, MD;  Location: [place];  Service: Gastroenterology;  Laterality: N/A;   HOT HEMOSTASIS N/A 02/13/2021   Procedure: HOT HEMOSTASIS (ARGON PLASMA COAGULATION/BICAP);  Surgeon: Jackquline Denmark, MD;  Location: [facility] ENDOSCOPY;  Service: Endoscopy;  Laterality: N/A;   HOT HEMOSTASIS N/A 05/07/2021   Procedure: HOT HEMOSTASIS (ARGON PLASMA COAGULATION/BICAP);  Surgeon: Yetta Flock, MD;  Location: [facility] ENDOSCOPY;  Service: Gastroenterology;  Laterality: N/A;   INSERTION OF DIALYSIS CATHETER N/A 02/08/2022   Procedure: INSERTION OF TUNNELED DIALYSIS CATHETER;  Surgeon: Angelia Mould, MD;  Location: [place];  Service: Vascular;  Laterality: N/A;   INTERTROCHANTERIC HIP FRACTURE SURGERY Left 11/2006   [name] 03/08/2011   INTRAVASCULAR ULTRASOUND/IVUS N/A 12/19/2019   Procedure: Intravascular Ultrasound/IVUS;  Surgeon: Jettie Booze, MD;  Location: [place] CV LAB;  Service: Cardiovascular;  Laterality: N/A;   IR FLUORO GUIDE CV LINE LEFT  03/14/2022   IR FLUORO  GUIDE CV LINE RIGHT  07/24/2019   IR FLUORO GUIDE CV LINE RIGHT  07/30/2019   IR US GUIDE VASC ACCESS RIGHT  07/24/2019   IR US GUIDE VASC ACCESS RIGHT  07/30/2019   LAPAROSCOPIC CHOLECYSTECTOMY  05/2006   LIGATION OF ARTERIOVENOUS  FISTULA Left 02/08/2022   Procedure: LIGATION OF LEFT ARTERIOVENOUS  FISTULA;  Surgeon: Angelia Mould, MD;  Location: [facility] OR;  Service: Vascular;  Laterality: Left;   LIGATION OF COMPETING BRANCHES OF ARTERIOVENOUS FISTULA Left 11/05/2018   Procedure: LIGATION OF COMPETING BRANCHES OF ARTERIOVENOUS FISTULA  LEFT  ARM;  Surgeon: Marty Heck, MD;  Location: [facility] OR;  Service: Vascular;  Laterality: Left;   LUMBAR LAMINECTOMY/DECOMPRESSION MICRODISCECTOMY N/A 02/29/2016   Procedure: Left L4-5 Lateral Recess Decompression, Removal Extradural Intraspinal Facet Cyst;  Surgeon: Marybelle Killings, MD;  Location: [place];  Service: Orthopedics;  Laterality: N/A;   METATARSAL HEAD EXCISION Right 07/20/2022    Procedure: Irragation and debridement of right foot wound with extraction of all necrotic soft tissue and bone;  Surgeon: Yevonne Pax, DPM;  Location: [place];  Service: Podiatry;  Laterality: Right;  Right 4th Met head and base of proximal phalanx resection   MULTIPLE TOOTH EXTRACTIONS     ORIF MANDIBULAR FRACTURE Left 08/13/2004   ORIF of left body fracture mandible with KLS Martin 2.3-mm six hole/notes 03/08/2011   RIGHT/LEFT HEART CATH AND CORONARY/GRAFT ANGIOGRAPHY N/A 12/19/2019   Procedure: RIGHT/LEFT HEART CATH AND CORONARY/GRAFT ANGIOGRAPHY;  Surgeon: Jettie Booze, MD;  Location: [place] CV LAB;  Service: Cardiovascular;  Laterality: N/A;   SCLEROTHERAPY  12/22/2019   Procedure: SCLEROTHERAPY;  Surgeon: Lavena Bullion, DO;  Location: [facility] ENDOSCOPY;  Service: Gastroenterology;;   Clide Deutscher  02/13/2021   Procedure: Clide Deutscher;  Surgeon: Jackquline Denmark, MD;  Location: [facility] ENDOSCOPY;  Service: Endoscopy;;    Prior to Admission medications   Medication Sig Start Date End Date Taking? Authorizing Provider  acetaminophen (TYLENOL) 650 MG CR tablet Take 1,300 mg by mouth 3 (three) times daily.   Yes [provider]  allopurinol (ZYLOPRIM) 100 MG tablet Take one tablet by mouth once daily. 05/11/22  Yes [name], MD  atorvastatin (LIPITOR) 10 MG tablet TAKE 1 TABLET(10 MG) BY MOUTH DAILY 02/14/22  Yes [name], MD  BIKTARVY 50-200-25 MG TABS tablet Take 1 tablet by mouth daily. 08/04/22  Yes [name], MD  carvedilol (COREG) 3.125 MG tablet Take 1 tablet (3.125 mg total) by mouth 2 (two) times daily with a meal. 08/10/22  Yes [name], MD  gabapentin (NEURONTIN) 300 MG capsule TAKE 1 CAPSULE BY MOUTH DAILY. 05/11/22  Yes [name], MD  insulin lispro (HUMALOG) 100 UNIT/ML KwikPen INJECT 20 UNITS UNDER THE SKIN THREE TIMES DAILY AS NEEDED FOR HIGH BLOOD SUGAR(ABOVE 150) Patient taking differently: Inject 15 Units into the skin  3 (three) times daily. INJECT 1 UNITS UNDER THE SKIN THREE TIMES DAILY AS NEEDED FOR HIGH BLOOD SUGAR(ABOVE 150) 06/09/22  Yes [name], MD  isosorbide mononitrate (IMDUR) 30 MG 24 hr tablet Take 1 tablet (30 mg total) by mouth daily. 05/11/22  Yes [name], MD  lidocaine-prilocaine (EMLA) cream Apply 1 application topically every Monday, Wednesday, and Friday with hemodialysis.   Yes [provider]  Menthol-Camphor (TIGER BALM ARTHRITIS RUB) 11-11 % CREA Apply 1 Application topically daily as needed (pain).   Yes [provider]  nitroGLYCERIN (NITROSTAT) 0.3 MG SL tablet PLACE 1 TABLET  UNDER THE TONGUE AS NEEDED FOR CHEST PAIN 03/29/22  Yes [name], MD  oxyCODONE-acetaminophen (PERCOCET) 5-325 MG tablet Take 1 tablet by mouth every 6 (six) hours as needed for severe pain. 09/20/22 09/20/23 Yes Medina-Vargas, Monina C, NP  umeclidinium-vilanterol (ANORO ELLIPTA) 62.5-25 MCG/ACT AEPB INHALE 1 PUFF BY MOUTH EVERY DAY 05/18/22  Yes [name], MD  B-D UF III MINI PEN NEEDLES 31G X 5 MM MISC USE 4 TIMES DAILY 06/06/22   [name], MD  Calcium Acetate 667 MG TABS Take 667-2,001 mg by mouth See admin instructions. Take 2001 mg by mouth three times a day with meals and 667 mg with each snack 01/20/21   [provider]  diclofenac Sodium (VOLTAREN) 1 % GEL Apply 2 grams topically to the affected area three times daily as needed for pain. 08/22/22   [name], MD  docusate sodium (COLACE) 100 MG capsule Take 100 mg by mouth daily as needed for mild constipation. Patient not taking: Reported on 09/08/2022    [provider]  folic acid (FOLVITE) 1 MG tablet TAKE 1 TABLET(1 MG) BY MOUTH DAILY 05/11/22   [name], MD  gentamicin ointment (GARAMYCIN) 0.1 % Apply 1 Application topically daily. 07/14/22   [provider]  glucose blood (ONETOUCH ULTRA) test strip Check blood sugar three times daily E11.40 08/12/22    [name], MD  Lancets (ONETOUCH DELICA PLUS [id number]) [place] USE 3 TIMES DAILY 08/15/22   [name], MD  latanoprost (XALATAN) 0.005 % ophthalmic solution Place 1 drop into both eyes at bedtime.    [provider]  multivitamin (RENA-VIT) TABS tablet Take 1 tablet by mouth daily.    [provider]  octreotide (SANDOSTATIN LAR DEPOT) 10 MG injection Inject 10 mg into the muscle every 28 days. 08/18/22   Irene Shipper, MD  ondansetron (ZOFRAN) 4 MG tablet TAKE 1 TABLET(4 MG) BY MOUTH EVERY 8 HOURS AS NEEDED FOR NAUSEA OR VOMITING 07/25/22   [name], MD  polyethylene glycol (MIRALAX / GLYCOLAX) 17 g packet Take 17 g by mouth daily as needed for mild constipation. 11/07/20   Aline August, MD  timolol (TIMOPTIC) 0.5 % ophthalmic solution Place 1 drop into both eyes in the morning. 04/04/19   [provider]    No current facility-administered medications for this encounter.   Current Outpatient Medications  Medication Sig Dispense Refill   acetaminophen (TYLENOL) 650 MG CR tablet Take 1,300 mg by mouth 3 (three) times daily.     allopurinol (ZYLOPRIM) 100 MG tablet Take one tablet by mouth once daily. 90 tablet 2   atorvastatin (LIPITOR) 10 MG tablet TAKE 1 TABLET(10 MG) BY MOUTH DAILY 90 tablet 2   BIKTARVY 50-200-25 MG TABS tablet Take 1 tablet by mouth daily. 30 tablet 11   carvedilol (COREG) 3.125 MG tablet Take 1 tablet (3.125 mg total) by mouth 2 (two) times daily with a meal. 180 tablet 3   gabapentin (NEURONTIN) 300 MG capsule TAKE 1 CAPSULE BY MOUTH DAILY. 90 capsule 2   insulin lispro (HUMALOG) 100 UNIT/ML KwikPen INJECT 20 UNITS UNDER THE SKIN THREE TIMES DAILY AS NEEDED FOR HIGH BLOOD SUGAR(ABOVE 150) (Patient taking differently: Inject 15 Units into the skin 3 (three) times daily. INJECT 1 UNITS UNDER THE SKIN THREE TIMES DAILY AS NEEDED FOR HIGH BLOOD SUGAR(ABOVE 150)) 45 mL 1   isosorbide mononitrate (IMDUR) 30 MG 24 hr tablet Take 1  tablet (30 mg  total) by mouth daily. 90 tablet 2   lidocaine-prilocaine (EMLA) cream Apply 1 application topically every Monday, Wednesday, and Friday with hemodialysis.     Menthol-Camphor (TIGER BALM ARTHRITIS RUB) 11-11 % CREA Apply 1 Application topically daily as needed (pain).     nitroGLYCERIN (NITROSTAT) 0.3 MG SL tablet PLACE 1 TABLET UNDER THE TONGUE AS NEEDED FOR CHEST PAIN 75 tablet 1   oxyCODONE-acetaminophen (PERCOCET) 5-325 MG tablet Take 1 tablet by mouth every 6 (six) hours as needed for severe pain. 30 tablet 0   umeclidinium-vilanterol (ANORO ELLIPTA) 62.5-25 MCG/ACT AEPB INHALE 1 PUFF BY MOUTH EVERY DAY 60 each 11   B-D UF III MINI PEN NEEDLES 31G X 5 MM MISC USE 4 TIMES DAILY 100 each 11   Calcium Acetate 667 MG TABS Take 667-2,001 mg by mouth See admin instructions. Take 2001 mg by mouth three times a day with meals and 667 mg with each snack     diclofenac Sodium (VOLTAREN) 1 % GEL Apply 2 grams topically to the affected area three times daily as needed for pain. 600 g 1   docusate sodium (COLACE) 100 MG capsule Take 100 mg by mouth daily as needed for mild constipation. (Patient not taking: Reported on 04/88/8916)     folic acid (FOLVITE) 1 MG tablet TAKE 1 TABLET(1 MG) BY MOUTH DAILY 90 tablet 2   gentamicin ointment (GARAMYCIN) 0.1 % Apply 1 Application topically daily.     glucose blood (ONETOUCH ULTRA) test strip Check blood sugar three times daily E11.40 300 strip 3   Lancets (ONETOUCH DELICA PLUS [id number]) MISC USE 3 TIMES DAILY 300 each 3   latanoprost (XALATAN) 0.005 % ophthalmic solution Place 1 drop into both eyes at bedtime.     multivitamin (RENA-VIT) TABS tablet Take 1 tablet by mouth daily.     octreotide (SANDOSTATIN LAR DEPOT) 10 MG injection Inject 10 mg into the muscle every 28 days. 1 kit 11   ondansetron (ZOFRAN) 4 MG tablet TAKE 1 TABLET(4 MG) BY MOUTH EVERY 8 HOURS AS NEEDED FOR NAUSEA OR VOMITING 20 tablet 1   polyethylene glycol (MIRALAX / GLYCOLAX)  17 g packet Take 17 g by mouth daily as needed for mild constipation. 14 each 0   timolol (TIMOPTIC) 0.5 % ophthalmic solution Place 1 drop into both eyes in the morning.      Allergies as of 09/30/2022 - Review Complete 09/30/2022  Allergen Reaction Noted   Augmentin [amoxicillin-pot clavulanate] Diarrhea and Other (See Comments) 12/05/2018   Morphine sulfate Anaphylaxis 04/06/2022   Mucinex fast-max Other (See Comments) 11/30/2020   Amphetamines Other (See Comments) 01/15/2012    Family History  Problem Relation Age of Onset   Heart failure Father    Hypertension Father    Diabetes Brother    Heart attack Brother    Alzheimer's disease Mother    Stroke Sister    Diabetes Sister    Alzheimer's disease Sister    Hypertension Brother    Diabetes Brother    Drug abuse Brother    Colon cancer Neg Hx     Social History   Socioeconomic History   Marital status: Single    Spouse name: Not on file   Number of children: 1   Years of education: Not on file   Highest education level: Not on file  Occupational History   Occupation: retired  Tobacco Use   Smoking status: Every Day    Packs/day: 0.50    Years: 43.00  Total pack years: 21.50    Types: Cigarettes    Passive exposure: Never   Smokeless tobacco: Never  Vaping Use   Vaping Use: Never used  Substance and Sexual Activity   Alcohol use: Yes    Alcohol/week: 12.0 standard drinks of alcohol    Types: 12 Standard drinks or equivalent per week    Comment: occassional   Drug use: Not Currently    Types: Cocaine    Comment: hx of crack/cocaine 60yr ago 10/01/2019- none   Sexual activity: Yes    Partners: Male    Comment: Declined condoms  Other Topics Concern   Not on file  Social History Narrative   Retired.       As of 05/2015:   Diet: No salt    Caffeine   Married: Single    House: Condo, 2 stories, 1 person (self)    Pets: No    Current/Past profession: N/A   Exercise: walks daily    Living Will: Yes     DNR   POA/HPOA: No       Social Determinants of Health   Financial Resource Strain: Low Risk  (10/26/2020)   Overall Financial Resource Strain (CARDIA)    Difficulty of Paying Living Expenses: Not hard at all  Food Insecurity: No Food Insecurity (08/03/2022)   Hunger Vital Sign    Worried About Running Out of Food in the Last Year: Never true    Ran Out of Food in the Last Year: Never true  Transportation Needs: No Transportation Needs (09/20/2022)   PRAPARE - THydrologist(Medical): No    Lack of Transportation (Non-Medical): No  Recent Concern: Transportation Needs - Unmet Transportation Needs (08/03/2022)   PRAPARE - Transportation    Lack of Transportation (Medical): Yes    Lack of Transportation (Non-Medical): Yes  Physical Activity: Insufficiently Active (06/26/2018)   Exercise Vital Sign    Days of Exercise per Week: 7 days    Minutes of Exercise per Session: 20 min  Stress: No Stress Concern Present (06/26/2018)   [place]   Feeling of Stress : Only a little  Social Connections: Somewhat Isolated (06/26/2018)   Social Connection and Isolation Panel [NHANES]    Frequency of Communication with Friends and Family: Three times a week    Frequency of Social Gatherings with Friends and Family: Three times a week    Attends Religious Services: More than 4 times per year    Active Member of Clubs or Organizations: No    Attends CArchivistMeetings: Never    Marital Status: Never married  Intimate Partner Violence: Not At Risk (06/26/2018)   Humiliation, Afraid, Rape, and Kick questionnaire    Fear of Current or Ex-Partner: No    Emotionally Abused: No    Physically Abused: No    Sexually Abused: No    Review of Systems: ROS is O/W negative except as mentioned in HPI.  Physical Exam: Vital signs in last 24 hours: Temp:  [97.4 F (36.3 C)-98.2 F (36.8 C)] 97.5 F  (36.4 C) (12/08 1131) Pulse Rate:  [59-77] 59 (12/08 1230) Resp:  [11-24] 13 (12/08 1230) BP: (108-128)/(50-70) 108/61 (12/08 1230) SpO2:  [92 %-100 %] 100 % (12/08 1230)   General:  Alert but sleepy, in and out of sleep.  Chronically ill-appearing. Head:  Normocephalic and atraumatic. Eyes:  Sclera clear, no icterus.  Conjunctiva pink. Ears:  Normal  auditory acuity. Mouth:  No deformity or lesions.   Lungs:  Clear throughout to auscultation.  No wheezes, crackles, or rhonchi.  Heart:  Regular rate and rhythm; no murmurs, clicks, rubs, or gallops. Abdomen:  Soft, non-distended.  BS present.  Non-tender.   Rectal:  Deferred.  Heme positive by the ED.  Msk:  Symmetrical without gross deformities. Pulses:  Normal pulses noted. Extremities:  Some swelling noted of LLE. Neurologic:  Alert, in and out of sleep, but seems appropriate when conversing. Skin:  Intact without significant lesions or rashes.  Lab Results: Recent Labs    09/30/22 0259  WBC 5.6  HGB 10.0*  HCT 31.4*  PLT 112*   BMET Recent Labs    09/30/22 0259  NA 136  K 5.4*  CL 91*  CO2 29  GLUCOSE 186*  BUN 98*  CREATININE 10.17*  CALCIUM 9.8   LFT Recent Labs    09/30/22 0259  PROT 7.2  ALBUMIN 3.1*  AST 20  ALT 11  ALKPHOS 63  BILITOT 0.6   PT/INR Recent Labs    09/30/22 0520  LABPROT 14.2  INR 1.1   Studies/Results: VAS [place] LOWER EXTREMITY VENOUS (DVT) (ONLY [facility] & [facility])  Result Date: 09/30/2022  Lower Venous DVT Study Patient Name:  REECE FEHNEL  Date of Exam:   09/30/2022 Medical Rec #: [id number]        Accession #:    [id number] Date of Birth: Jul 02, 1951         Patient Gender: M Patient Age:   42 years Exam Location:  [facility] Procedure:      VAS [place] LOWER EXTREMITY VENOUS (DVT) Referring Phys: Deno Etienne --------------------------------------------------------------------------------  Indications: Left leg pain and swelling, osteomyelitis.  Limitations: Acoustic shadowing  secondary to overlying arterial calcification. Comparison Study: No prior venous studies. Normal ABI 07-20-2022. Performing Technologist: Darlin Coco RDMS, RVT  Examination Guidelines: A complete evaluation includes B-mode imaging, spectral Doppler, color Doppler, and power Doppler as needed of all accessible portions of each vessel. Bilateral testing is considered an integral part of a complete examination. Limited examinations for reoccurring indications may be performed as noted. The reflux portion of the exam is performed with the patient in reverse Trendelenburg.  +-----+---------------+---------+-----------+----------+--------------+ RIGHTCompressibilityPhasicitySpontaneityPropertiesThrombus Aging +-----+---------------+---------+-----------+----------+--------------+ CFV  Full           Yes      Yes                                 +-----+---------------+---------+-----------+----------+--------------+   +---------+---------------+---------+-----------+----------+--------------+ LEFT     CompressibilityPhasicitySpontaneityPropertiesThrombus Aging +---------+---------------+---------+-----------+----------+--------------+ CFV      Full           Yes      Yes                                 +---------+---------------+---------+-----------+----------+--------------+ SFJ      Full                                                        +---------+---------------+---------+-----------+----------+--------------+ FV Prox  Full                                                        +---------+---------------+---------+-----------+----------+--------------+  FV Mid   Full                                                        +---------+---------------+---------+-----------+----------+--------------+ FV DistalFull                                                        +---------+---------------+---------+-----------+----------+--------------+ PFV      Full                                                         +---------+---------------+---------+-----------+----------+--------------+ POP      Full           Yes      Yes                                 +---------+---------------+---------+-----------+----------+--------------+ PTV      Full                                                        +---------+---------------+---------+-----------+----------+--------------+ PERO     Full                                                        +---------+---------------+---------+-----------+----------+--------------+    Summary: RIGHT: - No evidence of common femoral vein obstruction.  LEFT: - There is no evidence of deep vein thrombosis in the lower extremity.  - No cystic structure found in the popliteal fossa.  *See table(s) above for measurements and observations.    Preliminary    CT ABDOMEN PELVIS WO CONTRAST  Result Date: 09/30/2022 CLINICAL DATA:  There are stools with abdominal distension in left lower leg swelling. Bowel obstruction suspected. EXAM: CT ABDOMEN AND PELVIS WITHOUT CONTRAST TECHNIQUE: Multidetector CT imaging of the abdomen and pelvis was performed following the standard protocol without IV contrast. RADIATION DOSE REDUCTION: This exam was performed according to the departmental dose-optimization program which includes automated exposure control, adjustment of the mA and/or kV according to patient size and/or use of iterative reconstruction technique. COMPARISON:  01/21/2022 FINDINGS: Lower chest: The cardio pericardial silhouette is enlarged. Coronary artery calcification is evident. Hepatobiliary: No suspicious focal abnormality in the liver on this study without intravenous contrast. Gallbladder is surgically absent. Trace pneumobilia is again noted and compatible with prior sphincterotomy. Pancreas: No focal mass lesion. No dilatation of the main duct. No intraparenchymal cyst. No peripancreatic edema. Spleen: No  splenomegaly. No focal mass lesion. Adrenals/Urinary Tract: No adrenal nodule or mass. Punctate nonobstructing stone identified posterior interpolar right kidney on 41/3. Vascular calcification noted in each renal hilum. No evidence for  hydroureter. The urinary bladder appears normal for the degree of distention. Stomach/Bowel: Stomach is unremarkable. No gastric wall thickening. No evidence of outlet obstruction. Duodenum is normally positioned as is the ligament of Treitz. No small bowel wall thickening. No small bowel dilatation. The terminal ileum is normal. The appendix is normal. No gross colonic mass. No colonic wall thickening. Diverticular changes are noted in the left colon without evidence of diverticulitis. Vascular/Lymphatic: There is advanced atherosclerotic calcification of the abdominal aorta without aneurysm. There is no gastrohepatic or hepatoduodenal ligament lymphadenopathy. No retroperitoneal or mesenteric lymphadenopathy. No pelvic sidewall lymphadenopathy. Reproductive: The prostate gland and seminal vesicles are unremarkable. Other: No intraperitoneal free fluid. Musculoskeletal: Small left groin hernia contains only fat. Fixation hardware noted left femoral neck. No worrisome lytic or sclerotic osseous abnormality. Degenerative disc disease noted L4-5 and L5-S1. IMPRESSION: 1. No acute findings in the abdomen or pelvis. Specifically, no evidence for bowel obstruction. 2. Left colonic diverticulosis without diverticulitis. 3. Punctate nonobstructing right renal stone. 4. Small left groin hernia contains only fat. 5.  Aortic Atherosclerosis (ICD10-I70.0). Electronically Signed   By: Misty Stanley M.D.   On: 09/30/2022 07:38   DG Chest 1 View  Result Date: 09/30/2022 CLINICAL DATA:  Shortness of breath. EXAM: CHEST  1 VIEW COMPARISON:  07/18/2022. FINDINGS: The heart is enlarged and the mediastinal contour is stable. Atherosclerotic calcification of the aorta is noted. The pulmonary  vasculature is distended. Interstitial prominence is present bilaterally. No consolidation, effusion, or pneumothorax. No acute osseous abnormality. IMPRESSION: 1. Cardiomegaly with pulmonary vascular congestion. 2. Interstitial thickening bilaterally, possible edema or infiltrate. Electronically Signed   By: Brett Fairy M.D.   On: 09/30/2022 04:58    IMPRESSION:  Gastric and small bowel AVMs:  Is on Octreotide monthly for treatment of these and has done well for about 17 months Recurrent GI bleeding:  Related to the above.  Reports black stools for about a week and heme positive here. Anemia:  Currently Hgb is at/above his baseline at 10 grams. End-stage renal disease on HD   PLAN: -Any plans for endoscopic procedures per Dr. Silverio Decamp. -Monitor labs/Hgb. -Will do pantoprazole 40 mg IV BID for now. -Ok for clear liquids as no plans for urgent endoscopic procedure today.   Jacob [name]  09/30/2022, 12:52 PM   Attending physician's note  I have taken a history, reviewed the chart and examined the patient. I performed a substantive portion of this encounter, including complete performance of at least one of the key components, in conjunction with the APP. I agree with the APP's note, impression and recommendations.    71 year old old male with multiple medical comorbidities, end-stage renal disease on dialysis, CAD, COPD, known GI AVM with chronic iron deficiency anemia on octreotide presented to ER with complaints of leg pain He was noted to have heme positive stool Hemoglobin is 10.0, is slightly better than his baseline of around 8-9  Iron panel within normal range Continue octreotide, IV iron infusion as needed and erythropoietin per Nephrology  Patient was extremely upset when I went in to see him that he has not gone for his hemodialysis yet, informed me that he would leave, will ask someone to pick him up.  As I was talking to him, he started calling someone trying to arrange  pickup from the hospital.  Had to leave the room.    Will not plan for any repeat endoscopic evaluation given known history of GI AVM unless he develops brisk GI bleed.  GI is available if needed, please call with any questions     K. Denzil Magnuson , MD [phone]

## 2022-09-30 NOTE — ED Notes (Signed)
Patient expressing dissatisfaction in wait for dialysis, explained to patient that he would go to dialysis that we did not have a scheduled time. Patient reported being short of breath, patients RR 12 96% RA, offered patient O2 for comfort patient refused. Patient demanding to speak to Charge Nurse. Jacob [name] notified of patients request.

## 2022-09-30 NOTE — ED Notes (Signed)
Received call from dialysis for patient report. Dialysis reports that when ready for patient this RN would receive call.

## 2022-09-30 NOTE — ED Notes (Signed)
Patient off floor to dialysis on cardiac monitor.

## 2022-09-30 NOTE — ED Triage Notes (Signed)
Patient arrived with EMS from home reports dark stools with abdominal distention and left lower leg swelling onset last week . Hemodialysis q Mon/Wed/Fri. No fever or chills .

## 2022-09-30 NOTE — Progress Notes (Signed)
Lower extremity venous left study completed.  Preliminary results relayed to [facility], DO.  See CV Proc for preliminary results report.   Darlin Coco, RDMS, RVT

## 2022-09-30 NOTE — H&P (Signed)
History and Physical    Patient: Jacob [name] VQQ:[id number] DOB: 1951-09-25 DOA: 09/30/2022 DOS: the patient was seen and examined on 09/30/2022 PCP: [name], MD  Patient coming from: Home via EMS  Chief Complaint:  Chief Complaint  Patient presents with   Dark Stools / Abdominal Distention    HPI: Jacob [name] is a 71 y.o. male with medical history significant of HIV, essential hypertension, chronic combined systolic/diastolic congestive heart failure, ischemic cardiomyopathy, COPD, HLD, CAD, history of right foot diabetic wound infection and chronic osteomyelitis s/p right foot resection of fourth metatarsal head and base of proximal phalanx fourth toe (07/20/22), ESRD on HD MWF who presented to [facility] ED via EMS on 09/30/2022 complaining of dark stools, abdominal distention and left lower extremity swelling.  Onset of symptoms roughly 1 week.  Patient denies any blood thinners.  No changes in medication.  Reports compliance with hemodialysis, last session on Wednesday which was reported as full.  Also endorses generalized weakness and fatigue, which is slightly worse than his typical baseline.  Patient reports he lives alone, utilizes a walker for ambulation and able to perform all ADLs by himself.  Denies headache, no dizziness, no chest pain, palpitations, no shortness of breath, no abdominal pain, no focal weakness, no cough/congestion.  In the ED, temperature 98.2 F, HR 77, RR 18, BP 127/68, SpO2 92% on room air.  Sodium 136, potassium 5.4, chloride 91, CO2 29, glucose 186, BUN 98, creatinine 10.17, AST 20, ALT 11, total bilirubin 0.6.  WBC 5.6, hemoglobin 10.0.  Platelet count 112.  INR 1.1.  FOBT positive.  Chest x-ray with cardiomegaly, pulmonary vascular congestion, interstitial thickening bilaterally possibly edema versus infiltrate.  CT abdomen/pelvis without contrast with no acute findings in the abdomen or pelvis, no bowel obstruction, left colonic diverticulosis without  diverticulitis, punctate nonobstructing right renal stone, small left groin hernia containing only fat, aortic atherosclerosis.  Nephrology and [facility] were consulted.  TRH consulted for admission for further evaluation and management with concern for acute GI bleed.   Review of Systems: As mentioned in the history of present illness. All other systems reviewed and are negative. Past Medical History:  Diagnosis Date   Acute respiratory failure ([place]) 03/01/2018   Anemia    Arthritis    "all over; mostly knees and back" (02/28/2018)   Chronic combined systolic and diastolic CHF (congestive heart failure) (HCC)    Chronic lower back pain    stenosis   Community acquired pneumonia 09/06/2013   COPD (chronic obstructive pulmonary disease) ([place])    Coronary atherosclerosis of native coronary artery    a. 02/2003 s/p CABG x 2 (VG->RI, VG->RPDA; b. 11/2019 PCI: LM nl, LAD 90d, D3 50, RI 100, LCX 100p, OM3 100 - fills via L->L collats from D2/dLAD, RCA 100p, VG->RPDA ok, VG->RI 95 (3.5x48 Synergy XD DES).   Drug abuse ([place])    hx; tested for cocaine as recently as 2/08. says he is not using drugs now - avoided defib. for this reason    ESRD (end stage renal disease) ([place])    Hemo M-W-F- [facility]   Fall at home 10/2020   GERD (gastroesophageal reflux disease)    takes OTC meds as needed   GI bleeding    a. 11/2019 EGD: angiodysplastic lesions w/ bleeding s/p argon plasma/clipping/epi inj. Multiple admissions for the same.   Glaucoma    uses eye drops daily   Hepatitis B 1968   "tx'd w/isolation; caught it from  toilet stools in gym"   History of blood transfusion 03/01/2019   History of colon polyps    benign   History of gout    takes Allopurinol daily as well as Colchicine-if needed (02/28/2018)   History of kidney stones    HTN (hypertension)    takes Coreg,Imdur.and Apresoline daily   Human immunodeficiency virus (HIV) disease ([place]) dx'd 1995   on [place] as of 12/2020.     Hyperlipidemia     Ischemic cardiomyopathy    a. 01/2019 Echo: EF 40-45%, diffuse HK, mild basal septal hypertrophy. Diast dysfxn. Nl RV size/fxn. Sev dil LA. Triv MR/TR/PR.   Muscle spasm    takes Zanaflex as needed   Myocardial infarction ([place]) ~ 2004/2005   Nocturia    Peripheral neuropathy    takes gabapentin daily   Pneumonia    "at least twice" (02/28/2018)   SDH (subdural hematoma) (HCC)    Syphilis, unspecified    Type II diabetes mellitus ([place]) 2004   Lantus daily.Average fasting blood sugar 125-199   Wears glasses    Wears partial dentures    Past Surgical History:  Procedure Laterality Date   AV FISTULA PLACEMENT Left 08/02/2018   Procedure: ARTERIOVENOUS (AV) FISTULA CREATION  left arm radiocephlic;  Surgeon: Marty Heck, MD;  Location: [place];  Service: Vascular;  Laterality: Left;   AV FISTULA PLACEMENT Left 08/01/2019   Procedure: LEFT BRACHIOCEPHALIC ARTERIOVENOUS (AV) FISTULA CREATION;  Surgeon: [name], MD;  Location: [place];  Service: Vascular;  Laterality: Left;   AV FISTULA PLACEMENT Right 04/12/2022   Procedure: RIGHT UPPER EXTREMITY ARTERIOVENOUS (AV)  GRAFT INSERTION USING GORE STRETCH 4-7 MM;  Surgeon: Angelia Mould, MD;  Location: [place];  Service: Vascular;  Laterality: Right;   [place] Left 10/03/2019   Procedure: BASILIC VEIN TRANSPOSITION LEFT SECOND STAGE;  Surgeon: [name], MD;  Location: [place];  Service: Vascular;  Laterality: Left;   BIOPSY  01/25/2021   Procedure: BIOPSY;  Surgeon: Doran Stabler, MD;  Location: [facility] ENDOSCOPY;  Service: Gastroenterology;;   CARDIAC CATHETERIZATION  10/2002; 12/19/2004   [name] 03/08/2011   COLONOSCOPY  2013   [place]    CORONARY ARTERY BYPASS GRAFT  02/24/2003   CABG X2/notes 03/08/2011   CORONARY STENT INTERVENTION N/A 12/19/2019   Procedure: CORONARY STENT INTERVENTION;  Surgeon: Jettie Booze, MD;  Location: [place] CV LAB;  Service: Cardiovascular;  Laterality: N/A;    ENTEROSCOPY N/A 01/25/2021   Procedure: ENTEROSCOPY;  Surgeon: Doran Stabler, MD;  Location: [place];  Service: Gastroenterology;  Laterality: N/A;   ENTEROSCOPY N/A 02/13/2021   Procedure: ENTEROSCOPY;  Surgeon: Jackquline Denmark, MD;  Location: [facility] ENDOSCOPY;  Service: Endoscopy;  Laterality: N/A;   ENTEROSCOPY N/A 05/07/2021   Procedure: ENTEROSCOPY;  Surgeon: Yetta Flock, MD;  Location: [facility]COPY;  Service: Gastroenterology;  Laterality: N/A;   ESOPHAGOGASTRODUODENOSCOPY (EGD) WITH PROPOFOL N/A 02/08/2019   Procedure: ESOPHAGOGASTRODUODENOSCOPY (EGD) WITH PROPOFOL;  Surgeon: [place] Banister, MD;  Location: [place];  Service: Gastroenterology;  Laterality: N/A;   ESOPHAGOGASTRODUODENOSCOPY (EGD) WITH PROPOFOL N/A 12/22/2019   Procedure: ESOPHAGOGASTRODUODENOSCOPY (EGD) WITH PROPOFOL;  Surgeon: Lavena Bullion, DO;  Location: [place];  Service: Gastroenterology;  Laterality: N/A;   ESOPHAGOGASTRODUODENOSCOPY (EGD) WITH PROPOFOL N/A 10/19/2020   Procedure: ESOPHAGOGASTRODUODENOSCOPY (EGD) WITH PROPOFOL;  Surgeon: Jackquline Denmark, MD;  Location: [facility] ENDOSCOPY;  Service: Endoscopy;  Laterality: N/A;   ESOPHAGOGASTRODUODENOSCOPY (EGD) WITH PROPOFOL N/A 12/22/2020  Procedure: ESOPHAGOGASTRODUODENOSCOPY (EGD) WITH PROPOFOL;  Surgeon: Gatha [place], MD;  Location: [facility] ENDOSCOPY;  Service: Endoscopy;  Laterality: N/A;   ESOPHAGOGASTRODUODENOSCOPY (EGD) WITH PROPOFOL N/A 01/09/2021   Procedure: ESOPHAGOGASTRODUODENOSCOPY (EGD) WITH PROPOFOL;  Surgeon: Irene Shipper, MD;  Location: [facility] ENDOSCOPY;  Service: Endoscopy;  Laterality: N/A;   HEMOSTASIS CLIP PLACEMENT  12/22/2019   Procedure: HEMOSTASIS CLIP PLACEMENT;  Surgeon: Lavena Bullion, DO;  Location: [place] ENDOSCOPY;  Service: Gastroenterology;;   HEMOSTASIS CLIP PLACEMENT  12/22/2020   Procedure: HEMOSTASIS CLIP PLACEMENT;  Surgeon: Gatha Mayer, MD;  Location: [facility] ENDOSCOPY;  Service: Endoscopy;;   HEMOSTASIS CONTROL  12/22/2020    Procedure: HEMOSTASIS CONTROL/hemospray;  Surgeon: Gatha Mayer, MD;  Location: [place];  Service: Endoscopy;;   HOT HEMOSTASIS N/A 02/08/2019   Procedure: HOT HEMOSTASIS (ARGON PLASMA COAGULATION/BICAP);  Surgeon: [place] Banister, MD;  Location: [facility] ENDOSCOPY;  Service: Gastroenterology;  Laterality: N/A;   HOT HEMOSTASIS N/A 12/22/2019   Procedure: HOT HEMOSTASIS (ARGON PLASMA COAGULATION/BICAP);  Surgeon: Lavena Bullion, DO;  Location: [facility] ENDOSCOPY;  Service: Gastroenterology;  Laterality: N/A;   HOT HEMOSTASIS N/A 10/19/2020   Procedure: HOT HEMOSTASIS (ARGON PLASMA COAGULATION/BICAP);  Surgeon: [place]ine Denmark, MD;  Location: [facility] ENDOSCOPY;  Service: Endoscopy;  Laterality: N/A;   HOT HEMOSTASIS N/A 12/22/2020   Procedure: HOT HEMOSTASIS (ARGON PLASMA COAGULATION/BICAP);  Surgeon: Gatha Mayer, MD;  Location: [facility] ENDOSCOPY;  Service: Endoscopy;  Laterality: N/A;   HOT HEMOSTASIS N/A 01/09/2021   Procedure: HOT HEMOSTASIS (ARGON PLASMA COAGULATION/BICAP);  Surgeon: Irene Shipper, MD;  Location: [facility] ENDOSCOPY;  Service: Endoscopy;  Laterality: N/A;   HOT HEMOSTASIS N/A 01/25/2021   Procedure: HOT HEMOSTASIS (ARGON PLASMA COAGULATION/BICAP);  Surgeon: Doran Stabler, MD;  Location: [place];  Service: Gastroenterology;  Laterality: N/A;   HOT HEMOSTASIS N/A 02/13/2021   Procedure: HOT HEMOSTASIS (ARGON PLASMA COAGULATION/BICAP);  Surgeon: [place]ine Denmark, MD;  Location: [facility] ENDOSCOPY;  Service: Endoscopy;  Laterality: N/A;   HOT HEMOSTASIS N/A 05/07/2021   Procedure: HOT HEMOSTASIS (ARGON PLASMA COAGULATION/BICAP);  Surgeon: Yetta Flock, MD;  Location: [facility] ENDOSCOPY;  Service: Gastroenterology;  Laterality: N/A;   INSERTION OF DIALYSIS CATHETER N/A 02/08/2022   Procedure: INSERTION OF TUNNELED DIALYSIS CATHETER;  Surgeon: Angelia Mould, MD;  Location: [place];  Service: Vascular;  Laterality: N/A;   INTERTROCHANTERIC HIP FRACTURE SURGERY Left 11/2006   [name] 03/08/2011    INTRAVASCULAR ULTRASOUND/IVUS N/A 12/19/2019   Procedure: Intravascular Ultrasound/IVUS;  Surgeon: Jettie Booze, MD;  Location: [place] CV LAB;  Service: Cardiovascular;  Laterality: N/A;   IR FLUORO GUIDE CV LINE LEFT  03/14/2022   IR FLUORO GUIDE CV LINE RIGHT  07/24/2019   IR FLUORO GUIDE CV LINE RIGHT  07/30/2019   IR US GUIDE VASC ACCESS RIGHT  07/24/2019   IR US GUIDE VASC ACCESS RIGHT  07/30/2019   LAPAROSCOPIC CHOLECYSTECTOMY  05/2006   LIGATION OF ARTERIOVENOUS  FISTULA Left 02/08/2022   Procedure: LIGATION OF LEFT ARTERIOVENOUS  FISTULA;  Surgeon: Angelia Mould, MD;  Location: [place];  Service: Vascular;  Laterality: Left;   LIGATION OF COMPETING BRANCHES OF ARTERIOVENOUS FISTULA Left 11/05/2018   Procedure: LIGATION OF COMPETING BRANCHES OF ARTERIOVENOUS FISTULA  LEFT  ARM;  Surgeon: Marty Heck, MD;  [facility];  Service: Vascular;  Laterality: Left;   LUMBAR LAMINECTOMY/DECOMPRESSION MICRODISCECTOMY N/A 02/29/2016   Procedure: Left L4-5 Lateral Recess Decompression, Removal Extradural Intraspinal Facet Cyst;  Surgeon: Marybelle Killings, MD;  Location: [place];  Service: Orthopedics;  Laterality: N/A;   METATARSAL HEAD EXCISION Right 07/20/2022   Procedure: Irragation and debridement of right foot wound with extraction of all necrotic soft tissue and bone;  Surgeon: Yevonne Pax, DPM;  Location: [place];  Service: Podiatry;  Laterality: Right;  Right 4th Met head and base of proximal phalanx resection   MULTIPLE TOOTH EXTRACTIONS     ORIF MANDIBULAR FRACTURE Left 08/13/2004   ORIF of left body fracture mandible with KLS Martin 2.3-mm six hole/notes 03/08/2011   RIGHT/LEFT HEART CATH AND CORONARY/GRAFT ANGIOGRAPHY N/A 12/19/2019   Procedure: RIGHT/LEFT HEART CATH AND CORONARY/GRAFT ANGIOGRAPHY;  Surgeon: Jettie Booze, MD;  Location: [place] CV LAB;  Service: Cardiovascular;  Laterality: N/A;   SCLEROTHERAPY  12/22/2019   Procedure: SCLEROTHERAPY;   Surgeon: Lavena Bullion, DO;  Location: [place];  Service: Gastroenterology;;   Clide Deutscher  02/13/2021   Procedure: Clide Deutscher;  Surgeon: [place]ine Denmark, MD;  Location: [facility] ENDOSCOPY;  Service: Endoscopy;;   Social History:  reports that he has been smoking cigarettes. He has a 21.50 pack-year smoking history. He has never been exposed to tobacco smoke. He has never used smokeless tobacco. He reports current alcohol use of about 12.0 standard drinks of alcohol per week. He reports that he does not currently use drugs after having used the following drugs: Cocaine.  Allergies  Allergen Reactions   Augmentin [Amoxicillin-Pot Clavulanate] Diarrhea and Other (See Comments)    Severe diarrhea   Morphine Sulfate Anaphylaxis    Can't mix with oxycodone    Mucinex Fast-Max Other (See Comments)    Intense sweating    Amphetamines Other (See Comments)    Unknown reaction    Family History  Problem Relation Age of Onset   Heart failure Father    Hypertension Father    Diabetes Brother    Heart attack Brother    Alzheimer's disease Mother    Stroke Sister    Diabetes Sister    Alzheimer's disease Sister    Hypertension Brother    Diabetes Brother    Drug abuse Brother    Colon cancer Neg Hx     Prior to Admission medications   Medication Sig Start Date End Date Taking? Authorizing Provider  acetaminophen (TYLENOL) 650 MG CR tablet Take 1,300 mg by mouth 3 (three) times daily.   Yes [provider]  allopurinol (ZYLOPRIM) 100 MG tablet Take one tablet by mouth once daily. 05/11/22  Yes [name], MD  atorvastatin (LIPITOR) 10 MG tablet TAKE 1 TABLET(10 MG) BY MOUTH DAILY 02/14/22  Yes [name], MD  BIKTARVY 50-200-25 MG TABS tablet Take 1 tablet by mouth daily. 08/04/22  Yes [name], MD  carvedilol (COREG) 3.125 MG tablet Take 1 tablet (3.125 mg total) by mouth 2 (two) times daily with a meal. 08/10/22  Yes [name], MD  gabapentin  (NEURONTIN) 300 MG capsule TAKE 1 CAPSULE BY MOUTH DAILY. 05/11/22  Yes [name], MD  insulin lispro (HUMALOG) 100 UNIT/ML KwikPen INJECT 20 UNITS UNDER THE SKIN THREE TIMES DAILY AS NEEDED FOR HIGH BLOOD SUGAR(ABOVE 150) Patient taking differently: Inject 15 Units into the skin 3 (three) times daily. INJECT 1 UNITS UNDER THE SKIN THREE TIMES DAILY AS NEEDED FOR HIGH BLOOD SUGAR(ABOVE 150) 06/09/22  Yes [name], MD  isosorbide mononitrate (IMDUR) 30 MG 24 hr tablet Take 1 tablet (30 mg total) by mouth daily. 05/11/22  Yes [name], MD  lidocaine-prilocaine (EMLA) cream Apply 1 application topically every Monday, Wednesday, and Friday with hemodialysis.   Yes [provider]  Menthol-Camphor (TIGER BALM ARTHRITIS RUB) 11-11 % CREA Apply 1 Application topically daily as needed (pain).   Yes [provider]  nitroGLYCERIN (NITROSTAT) 0.3 MG SL tablet PLACE 1 TABLET UNDER THE TONGUE AS NEEDED FOR CHEST PAIN 03/29/22  Yes [name], MD  oxyCODONE-acetaminophen (PERCOCET) 5-325 MG tablet Take 1 tablet by mouth every 6 (six) hours as needed for severe pain. 09/20/22 09/20/23 Yes Medina-Vargas, Monina C, NP  umeclidinium-vilanterol (ANORO ELLIPTA) 62.5-25 MCG/ACT AEPB INHALE 1 PUFF BY MOUTH EVERY DAY 05/18/22  Yes [name], MD  B-D UF III MINI PEN NEEDLES 31G X 5 MM MISC USE 4 TIMES DAILY 06/06/22   [name], MD  Calcium Acetate 667 MG TABS Take 667-2,001 mg by mouth See admin instructions. Take 2001 mg by mouth three times a day with meals and 667 mg with each snack 01/20/21   [provider]  diclofenac Sodium (VOLTAREN) 1 % GEL Apply 2 grams topically to the affected area three times daily as needed for pain. 08/22/22   [name], MD  docusate sodium (COLACE) 100 MG capsule Take 100 mg by mouth daily as needed for mild constipation. Patient not taking: Reported on 09/08/2022    [provider]  folic acid (FOLVITE) 1  MG tablet TAKE 1 TABLET(1 MG) BY MOUTH DAILY 05/11/22   [name], MD  gentamicin ointment (GARAMYCIN) 0.1 % Apply 1 Application topically daily. 07/14/22   [provider]  glucose blood (ONETOUCH ULTRA) test strip Check blood sugar three times daily E11.40 08/12/22   [name], MD  Lancets (ONETOUCH DELICA PLUS URKYHC62B) [place] USE 3 TIMES DAILY 08/15/22   [name], MD  latanoprost (XALATAN) 0.005 % ophthalmic solution Place 1 drop into both eyes at bedtime.    [provider]  multivitamin (RENA-VIT) TABS tablet Take 1 tablet by mouth daily.    [provider]  octreotide (SANDOSTATIN LAR DEPOT) 10 MG injection Inject 10 mg into the muscle every 28 days. 08/18/22   Irene Shipper, MD  ondansetron (ZOFRAN) 4 MG tablet TAKE 1 TABLET(4 MG) BY MOUTH EVERY 8 HOURS AS NEEDED FOR NAUSEA OR VOMITING 07/25/22   [name], MD  polyethylene glycol (MIRALAX / GLYCOLAX) 17 g packet Take 17 g by mouth daily as needed for mild constipation. 11/07/20   Aline August, MD  timolol (TIMOPTIC) 0.5 % ophthalmic solution Place 1 drop into both eyes in the morning. 04/04/19   [provider]    Physical Exam: Vitals:   09/30/22 0630 09/30/22 0717 09/30/22 0722 09/30/22 0915  BP: (!) 117/57 117/70  121/67  Pulse:  61  65  Resp: _0 Temp:   (!) 97.4 F (36.3 C)   TempSrc:   Oral   SpO2:  100%  98%    GEN: 71 yo male in NAD, alert and oriented x 3, chronically ill appearance, appears older than stated age HEENT: NCAT, PERRL, EOMI, sclera clear, MMM, poor dentition PULM: CTAB w/o wheezes/crackles, normal respiratory effort, on room air CV: RRR w/o M/G/R GI: abd soft, mild abdominal distention, nontender to palpation, NABS, no R/G/M MSK: Trace-1+ peripheral edema left lower extremity, moves all extremities independently NEURO: CN II-XII intact, no focal deficits, sensation to light touch slightly decreased distal portion of left lower  extremity due to his peripheral  neuropathy which is unchanged from his chronic issues PSYCH: normal mood/affect Integumentary: Callus noted to plantar surface right foot, otherwise no other concerning rashes/lesions/wounds noted on exposed skin surfaces   Assessment and Plan:  Upper GI bleed 2/2 AVMs Patient presenting to ED with 1 week history of dark tarry stools.  Not on antiplatelet or anticoagulants.  Reported history of AVMs in the past.  Underwent small bowel enteroscopy in 2022 by Dr. Havery Moros with findings of 15 small to medium size angiodysplastic lesions with no bleeding in the gastric fundus/body and underwent APC as well as for angiodysplastic lesions with no bleeding in the proximal duodenal bulb s/p APC.  Hemoglobin on admission 10.0, baseline 9-10.  FOBT positive. -- [place] consulted -- Continue monitor hemoglobin/hematocrit every 8 hours -- Protonix 40 mg IV q12h  HIV -- Will order Biktarvy once can transition from n.p.o. status to diet -- Continue outpatient follow-up with infectious disease  essential hypertension chronic combined systolic/diastolic congestive heart failure Hx ischemic cardiomyopathy CAD Home regimen includes isosorbide mononitrate 30 mg p.o. daily, carvedilol 3.125 mg p.o. twice daily, and 10 mg p.o. daily. -- Blood pressure currently 121/67, well-controlled. -- Will currently hold antihypertensives, statin given blood pressure is well-controlled in the setting of GI bleed as above -- Continue monitor BP closely  COPD Not oxygen dependent at baseline. -- Continue Anoro Ellipta 1 puff daily -- Albuterol neb every 4 hours as needed shortness of breath/wheezing  HLD -- Will resume lovastatin 10 mg p.o. daily once diet ordered  Type 2 diabetes mellitus Hemoglobin A1c 6.4 on 08/03/2022, well-controlled.  Currently on Humalog 15 units 3 times daily AC at home. -- very sensitive SSI for coverage -- CBGs before every meal/at bedtime while  n.p.o.  Peripheral neuropathy -- Will plan to resume gabapentin after GI evaluation, currently n.p.o.  Hx right foot diabetic wound infection and chronic osteomyelitis  Patient recently hospitalized in September 2023 and underwent s/p right foot resection of fourth metatarsal head and base of proximal phalanx fourth toe on 07/20/2022 by podiatry, Dr. Loel Lofty.  No signs of active infection or wounds to right foot. -- Continue outpatient follow-up with podiatry  ESRD on HD MWF Patient typically dialyzes on a Monday/Wednesday/Friday schedule at [facility] HD clinic.  Reports full dialysis session this Wednesday. --Nephrology consulted for continued HD while inpatient  Weakness/debility/deconditioning: Patient lives alone, utilizes a walker at baseline.  Feels his fatigue/weakness is slightly worse than his typical baseline. -- PT/OT evaluation in the a.m.   Advance Care Planning: Full code  Consults: [facility], nephrology  Family Communication: No family present at bedside  Severity of Illness: The appropriate patient status for this patient is OBSERVATION. Observation status is judged to be reasonable and necessary in order to provide the required intensity of service to ensure the patient's safety. The patient's presenting symptoms, physical exam findings, and initial radiographic and laboratory data in the context of their medical condition is felt to place them at decreased risk for further clinical deterioration. Furthermore, it is anticipated that the patient will be medically stable for discharge from the hospital within 2 midnights of admission.   Author: Donnamarie Poag [place] DO 09/30/2022 10:30 AM  For on call review [email or web address].

## 2022-09-30 NOTE — ED Provider Notes (Signed)
[facility] EMERGENCY DEPARTMENT Provider Note   CSN: [id number] Arrival date & time: 09/30/22  0250     History  Chief Complaint  Patient presents with   Dark Stools / Abdominal Distention     Jacob [name] is a 71 y.o. male.  HPI   Patient with medical history including CHF, cardiomyopathy, hypertension, COPD, end-stage renal disease currently on dialysis Monday Wednesday Friday, HIV, osteomyelitis of the right foot status post debridements fourth digit presents with complaints of worsening left leg swelling.  Patient states swelling for about 2 weeks time, knows she is having worsening swelling in that leg as well as more swelling in his abdomen, he has no worsening pain no paresthesias or weakness, he is endorsing any chest pain or shortness of breath, states that he has been compliant on his dialysis, has not missed any sessions.  He is not on anticoag's no history of DVTs.  He also notes that he is having dark tarry stools, this started today, states he has some slight stomach pains but no nausea or vomiting.  I reviewed patient's chart has a history of upper GI bleeds secondary due to angiodysplastic lesions, underwent EGD with claudication.  Patient was followed by [facility]. Home Medications Prior to Admission medications   Medication Sig Start Date End Date Taking? Authorizing Provider  acetaminophen (TYLENOL) 650 MG CR tablet Take 1,300 mg by mouth 3 (three) times daily.    [provider]  allopurinol (ZYLOPRIM) 100 MG tablet Take one tablet by mouth once daily. 05/11/22   [name], MD  atorvastatin (LIPITOR) 10 MG tablet TAKE 1 TABLET(10 MG) BY MOUTH DAILY 02/14/22   [name], MD  B-D UF III MINI PEN NEEDLES 31G X 5 MM MISC USE 4 TIMES DAILY 06/06/22   [name], MD  BIKTARVY 50-200-25 MG TABS tablet Take 1 tablet by mouth daily. 08/04/22   [name], MD  Calcium Acetate 667 MG TABS Take 667-2,001 mg by mouth See admin  instructions. Take 2001 mg by mouth three times a day with meals and 667 mg with each snack 01/20/21   [provider]  carvedilol (COREG) 3.125 MG tablet Take 1 tablet (3.125 mg total) by mouth 2 (two) times daily with a meal. 08/10/22   [name], MD  diclofenac Sodium (VOLTAREN) 1 % GEL Apply 2 grams topically to the affected area three times daily as needed for pain. 08/22/22   [name], MD  docusate sodium (COLACE) 100 MG capsule Take 100 mg by mouth daily as needed for mild constipation. Patient not taking: Reported on 09/08/2022    [provider]  folic acid (FOLVITE) 1 MG tablet TAKE 1 TABLET(1 MG) BY MOUTH DAILY 05/11/22   [name], MD  gabapentin (NEURONTIN) 300 MG capsule TAKE 1 CAPSULE BY MOUTH DAILY. 05/11/22   [name], MD  gentamicin ointment (GARAMYCIN) 0.1 % Apply 1 Application topically daily. 07/14/22   [provider]  glucose blood (ONETOUCH ULTRA) test strip Check blood sugar three times daily E11.40 08/12/22   [name], MD  insulin lispro (HUMALOG) 100 UNIT/ML KwikPen INJECT 20 UNITS UNDER THE SKIN THREE TIMES DAILY AS NEEDED FOR HIGH BLOOD SUGAR(ABOVE 150) 06/09/22   [name], MD  isosorbide mononitrate (IMDUR) 30 MG 24 hr tablet Take 1 tablet (30 mg total) by mouth daily. 05/11/22   [name], MD  Lancets (ONETOUCH DELICA PLUS [id number]) MISC USE  3 TIMES DAILY 08/15/22   [name], MD  latanoprost (XALATAN) 0.005 % ophthalmic solution Place 1 drop into both eyes at bedtime.    [provider]  lidocaine-prilocaine (EMLA) cream Apply 1 application topically every Monday, Wednesday, and Friday with hemodialysis.    [provider]  Menthol-Camphor (TIGER BALM ARTHRITIS RUB) 11-11 % CREA Apply 1 Application topically daily as needed (pain).    [provider]  multivitamin (RENA-VIT) TABS tablet Take 1 tablet by mouth daily.    [provider]   nitroGLYCERIN (NITROSTAT) 0.3 MG SL tablet PLACE 1 TABLET UNDER THE TONGUE AS NEEDED FOR CHEST PAIN 03/29/22   [name], MD  octreotide (SANDOSTATIN LAR DEPOT) 10 MG injection Inject 10 mg into the muscle every 28 days. 08/18/22   Irene Shipper, MD  ondansetron (ZOFRAN) 4 MG tablet TAKE 1 TABLET(4 MG) BY MOUTH EVERY 8 HOURS AS NEEDED FOR NAUSEA OR VOMITING 07/25/22   [name], MD  oxyCODONE-acetaminophen (PERCOCET) 5-325 MG tablet Take 1 tablet by mouth every 6 (six) hours as needed for severe pain. 09/20/22 09/20/23  Medina-Vargas, Monina C, NP  polyethylene glycol (MIRALAX / GLYCOLAX) 17 g packet Take 17 g by mouth daily as needed for mild constipation. 11/07/20   Aline August, MD  timolol (TIMOPTIC) 0.5 % ophthalmic solution Place 1 drop into both eyes in the morning. 04/04/19   [provider]  umeclidinium-vilanterol [facility] ELLIPTA) 62.5-25 MCG/ACT AEPB INHALE 1 PUFF BY MOUTH EVERY DAY 05/18/22   [name], MD      Allergies    Augmentin [amoxicillin-pot clavulanate], Morphine sulfate, Mucinex fast-max, and Amphetamines    Review of Systems   Review of Systems  Constitutional:  Negative for chills and fever.  Respiratory:  Negative for shortness of breath.   Cardiovascular:  Positive for leg swelling. Negative for chest pain.  Gastrointestinal:  Positive for blood in stool. Negative for abdominal pain.  Neurological:  Negative for headaches.    Physical Exam Updated Vital Signs BP (!) 117/57   Pulse 66   Temp 98.2 F (36.8 C)   Resp 11   SpO2 100%  Physical Exam Vitals and nursing note reviewed. Exam conducted with a chaperone present.  Constitutional:      General: He is not in acute distress.    Appearance: He is not ill-appearing.  HENT:     Head: Normocephalic and atraumatic.     Nose: No congestion.  Eyes:     Conjunctiva/sclera: Conjunctivae normal.  Cardiovascular:     Rate and Rhythm: Normal rate and regular rhythm.     Pulses:  Normal pulses.     Heart sounds: No murmur heard.    No friction rub. No gallop.  Pulmonary:     Effort: No respiratory distress.     Breath sounds: No wheezing, rhonchi or rales.  Abdominal:     General: There is distension.     Palpations: Abdomen is soft.     Tenderness: There is abdominal tenderness. There is no right CVA tenderness or left CVA tenderness.     Comments: Abdomen distended, soft, dull to percussion, noted epigastric tenderness without guarding potential peritoneal sign.  Genitourinary:    Comments: With chaperone present rectal exam was performed, no rectal discharge or rectal bleeding, nonthrombosed external hemorrhoid at the 1 o'clock position, digital rectal exam was performed patient was tender during my examination but no palpable mass or fistula noted, patient had melanotic looking stool no  frank bloody stools, Hemoccult is positive Musculoskeletal:     Right lower leg: Edema present.     Left lower leg: Edema present.     Comments: Patient has bilateral lower extremity edema, left is greater than the right, 2+ pitting edema in the left versus 1+ pain given the right, there is no overlying skin changes, no palpable cords no calf tenderness, 2+ dorsal pedal pulses, sensation intact to light touch.  Skin:    General: Skin is warm and dry.  Neurological:     Mental Status: He is alert.  Psychiatric:        Mood and Affect: Mood normal.     ED Results / Procedures / Treatments   Labs (all labs ordered are listed, but only abnormal results are displayed) Labs Reviewed  CBC WITH DIFFERENTIAL/PLATELET - Abnormal; Notable for the following components:      Result Value   RBC 2.92 (*)    Hemoglobin 10.0 (*)    HCT 31.4 (*)    MCV 107.5 (*)    MCH 34.2 (*)    RDW 16.2 (*)    Platelets 112 (*)    All other components within normal limits  COMPREHENSIVE METABOLIC PANEL - Abnormal; Notable for the following components:   Potassium 5.4 (*)    Chloride 91 (*)     Glucose, Bld 186 (*)    BUN 98 (*)    Creatinine, Ser 10.17 (*)    Albumin 3.1 (*)    GFR, Estimated 5 (*)    Anion gap 16 (*)    All other components within normal limits  POC OCCULT BLOOD, ED - Abnormal; Notable for the following components:   Fecal Occult Bld POSITIVE (*)    All other components within normal limits  PROTIME-INR  TYPE AND SCREEN      Radiology DG Chest 1 View  Result Date: 09/30/2022 CLINICAL DATA:  Shortness of breath. EXAM: CHEST  1 VIEW COMPARISON:  07/18/2022. FINDINGS: The heart is enlarged and the mediastinal contour is stable. Atherosclerotic calcification of the aorta is noted. The pulmonary vasculature is distended. Interstitial prominence is present bilaterally. No consolidation, effusion, or pneumothorax. No acute osseous abnormality. IMPRESSION: 1. Cardiomegaly with pulmonary vascular congestion. 2. Interstitial thickening bilaterally, possible edema or infiltrate. Electronically Signed   By: Brett Fairy M.D.   On: 09/30/2022 04:58    Procedures Procedures    Medications Ordered in ED Medications  pantoprazole (PROTONIX) injection 40 mg (40 mg Intravenous Given 09/30/22 0603)  sodium zirconium cyclosilicate (LOKELMA) packet 10 g (10 g Oral Given 09/30/22 4128)    ED Course/ Medical Decision Making/ A&P                           Medical Decision Making Amount and/or Complexity of Data Reviewed Labs: ordered. Radiology: ordered.  Risk Prescription drug management.   This patient presents to the ED for concern of bloody stools, leg swelling, this involves an extensive number of treatment options, and is a complaint that carries with it a high risk of complications and morbidity.  The differential diagnosis includes upper GI bleeding, diverticulitis, DVT, CHF    Additional history obtained:  Additional history obtained from N/A External records from outside source obtained and reviewed including GI notes, podiatry notes   Co morbidities  that complicate the patient evaluation  CHF, end-stage renal disease  Social Determinants of Health:  N/a    Lab Tests:  I  Ordered, and personally interpreted labs.  The pertinent results include: CBC shows normocytic anemia hemoglobin 10, CMP shows potassium 5.4, glucose 186, creatinine 10, Hemoccult positive   Imaging Studies ordered:  I ordered imaging studies including chest x-ray, CT AP I independently visualized and interpreted imaging which showed chest x-ray reveals cardiomegaly with vascular congestion I agree with the radiologist interpretation   Cardiac Monitoring:  The patient was maintained on a cardiac monitor.  I personally viewed and interpreted the cardiac monitored which showed an underlying rhythm of: EKG without signs of ischemia   Medicines ordered and prescription drug management:  I ordered medication including Protonix, Lokelma I have reviewed the patients home medicines and have made adjustments as needed  Critical Interventions:  N/A   Reevaluation:  Presents with leg swelling and tarry stools, patient had a positive Hemoccult, concern for upper GI bleed, will start him on Protonix, patient does have an elevated potassium without EKG changes, will prompt Lokelma.  Will add on CT AP for further evaluation.    Consultations Obtained:  N/a    Test Considered:  N/a    Rule out Suspicion for an emergent hemodialysis is low at this time, no new oxygen requirements, no significant electrolyte derailment.  I have low suspicion for cellulitis is no evidence of cellulitis present on examination.  I doubt patient will require emergent blood this time for likely upper GI bleed as he is hemodynamically stable, hemoglobin is stable at this time.    Dispostion and problem list  Due to shift change patient be handed off to spoke with Dr Ronnald Nian   Follow-up on patient's CT scan as well as DVT study, if no acute findings noted on either scan I  would consult with [facility] determine if patient needs to be admitted for likely upper GI bleed and undergo repeat EDG.            Final Clinical Impression(s) / ED Diagnoses Final diagnoses:  Bloody stool  Leg swelling    Rx / DC Orders ED Discharge Orders     None         Larkin, [place], PA-C 09/30/22 0715    Orpah Greek, MD 09/30/22 [phone]

## 2022-09-30 NOTE — ED Notes (Signed)
Pt is feeling upset and irritated because he has been sice 2:30am this morning. Stated he should have dialysis and his medications hours ago. Pt has been informed that he is currently waiting on dialysis.

## 2022-10-01 DIAGNOSIS — K31811 Angiodysplasia of stomach and duodenum with bleeding: Secondary | ICD-10-CM | POA: Diagnosis not present

## 2022-10-01 DIAGNOSIS — K922 Gastrointestinal hemorrhage, unspecified: Secondary | ICD-10-CM | POA: Diagnosis not present

## 2022-10-01 LAB — BASIC METABOLIC PANEL
Anion gap: 16 — ABNORMAL HIGH (ref 5–15)
BUN: 53 mg/dL — ABNORMAL HIGH (ref 8–23)
CO2: 28 mmol/L (ref 22–32)
Calcium: 8.9 mg/dL (ref 8.9–10.3)
Chloride: 93 mmol/L — ABNORMAL LOW (ref 98–111)
Creatinine, Ser: 6.87 mg/dL — ABNORMAL HIGH (ref 0.61–1.24)
GFR, Estimated: 8 mL/min — ABNORMAL LOW (ref >60)
Glucose, Bld: 127 mg/dL — ABNORMAL HIGH (ref 70–99)
Potassium: 4.7 mmol/L (ref 3.5–5.1)
Sodium: 137 mmol/L (ref 135–145)

## 2022-10-01 LAB — CBC
HCT: 33.2 % — ABNORMAL LOW (ref 39.0–52.0)
Hemoglobin: 10.8 g/dL — ABNORMAL LOW (ref 13.0–17.0)
MCH: 33.8 pg (ref 26.0–34.0)
MCHC: 32.5 g/dL (ref 30.0–36.0)
MCV: 103.8 fL — ABNORMAL HIGH (ref 80.0–100.0)
Platelets: 113 10*3/uL — ABNORMAL LOW (ref 150–400)
RBC: 3.2 MIL/uL — ABNORMAL LOW (ref 4.22–5.81)
RDW: 15.6 % — ABNORMAL HIGH (ref 11.5–15.5)
WBC: 5.2 10*3/uL (ref 4.0–10.5)
nRBC: 0 % (ref 0.0–0.2)

## 2022-10-01 LAB — GLUCOSE, CAPILLARY
Glucose-Capillary: 123 mg/dL — ABNORMAL HIGH (ref 70–99)
Glucose-Capillary: 134 mg/dL — ABNORMAL HIGH (ref 70–99)
Glucose-Capillary: 117 mg/dL — ABNORMAL HIGH (ref 70–99)

## 2022-10-01 MED ORDER — OXYCODONE HCL 5 MG PO TABS: 5.0000 mg | ORAL_TABLET | ORAL | Status: DC | PRN

## 2022-10-01 MED ORDER — ONDANSETRON HCL 4 MG PO TABS: 4.0000 mg | ORAL_TABLET | Freq: Four times a day (QID) | ORAL | Status: DC | PRN

## 2022-10-01 MED ORDER — ONDANSETRON HCL 4 MG/2ML IJ SOLN: 4.0000 mg | Freq: Four times a day (QID) | INTRAMUSCULAR | Status: DC | PRN

## 2022-10-01 MED ORDER — INSULIN ASPART 100 UNIT/ML IJ SOLN: 0.0000 [IU] | Freq: Three times a day (TID) | INTRAMUSCULAR | Status: DC

## 2022-10-01 MED ORDER — ALBUTEROL SULFATE (2.5 MG/3ML) 0.083% IN NEBU: 2.5000 mg | INHALATION_SOLUTION | RESPIRATORY_TRACT | Status: DC | PRN

## 2022-10-01 MED ORDER — UMECLIDINIUM-VILANTEROL 62.5-25 MCG/ACT IN AEPB
1.0000 | INHALATION_SPRAY | Freq: Every day | RESPIRATORY_TRACT | Status: DC
  Administered 2022-10-01: 1 via RESPIRATORY_TRACT
  Filled 2022-10-01: qty 14

## 2022-10-01 MED ORDER — PANTOPRAZOLE SODIUM 40 MG IV SOLR
40.0000 mg | Freq: Two times a day (BID) | INTRAVENOUS | Status: DC
  Filled 2022-10-01: qty 10

## 2022-10-01 NOTE — Discharge Summary (Signed)
Physician Discharge Summary  Jacob [name] NKN:[id number] DOB: 1951-04-25 DOA: 09/30/2022  PCP: [name], MD  Admit date: 09/30/2022 Discharge date: 10/01/2022  Admitted From: Home Disposition: Home  Recommendations for Outpatient Follow-up:  Follow up with PCP in 1-2 weeks Please obtain BMP/CBC in one week  Home Health: No Equipment/Devices: None  Discharge Condition: Stable CODE STATUS: Full code Diet recommendation: Renal diet  History of present illness:  Jacob [name] is a 71 y.o. male with medical history significant of HIV, essential hypertension, chronic combined systolic/diastolic congestive heart failure, ischemic cardiomyopathy, COPD, HLD, CAD, history of right foot diabetic wound infection and chronic osteomyelitis s/p right foot resection of fourth metatarsal head and base of proximal phalanx fourth toe (07/20/22), ESRD on HD MWF who presented to [facility] ED via EMS on 09/30/2022 complaining of dark stools, abdominal distention and left lower extremity swelling.  Onset of symptoms roughly 1 week.  Patient denies any blood thinners.  No changes in medication.  Reports compliance with hemodialysis, last session on Wednesday which was reported as full.  Also endorses generalized weakness and fatigue, which is slightly worse than his typical baseline.  Patient reports he lives alone, utilizes a walker for ambulation and able to perform all ADLs by himself.  Denies headache, no dizziness, no chest pain, palpitations, no shortness of breath, no abdominal pain, no focal weakness, no cough/congestion.   In the ED, temperature 98.2 F, HR 77, RR 18, BP 127/68, SpO2 92% on room air.  Sodium 136, potassium 5.4, chloride 91, CO2 29, glucose 186, BUN 98, creatinine 10.17, AST 20, ALT 11, total bilirubin 0.6.  WBC 5.6, hemoglobin 10.0.  Platelet count 112.  INR 1.1.  FOBT positive.  Chest x-ray with cardiomegaly, pulmonary vascular congestion, interstitial thickening bilaterally possibly  edema versus infiltrate.  CT abdomen/pelvis without contrast with no acute findings in the abdomen or pelvis, no bowel obstruction, left colonic diverticulosis without diverticulitis, punctate nonobstructing right renal stone, small left groin hernia containing only fat, aortic atherosclerosis.  Nephrology and [facility] were consulted.  TRH consulted for admission for further evaluation and management with concern for acute GI bleed.  Hospital course:  Upper GI bleed likely secondary to history of AVMs Patient presenting to ED with 1 week history of dark tarry stools.  Not on antiplatelet or anticoagulants.  Reported history of AVMs in the past.  Underwent small bowel enteroscopy in 2022 by Dr. Havery Moros with findings of 15 small to medium size angiodysplastic lesions with no bleeding in the gastric fundus/body and underwent APC as well as for angiodysplastic lesions with no bleeding in the proximal duodenal bulb s/p APC.  Hemoglobin on admission 10.0, baseline 9-10.  FOBT positive.  [facility] was consulted.  Seen by Dr. Silverio Decamp; given stability of his hemoglobin no endoscopy warranted at this time as patient does not have any brisk bleeding.  Recommend outpatient follow-up with GI.  Hemoglobin stable, 10.8 at time of discharge.  Left lower extremity edema Venous duplex ultrasound left lower extremity negative for DVT.   HIV Continue Biktarvy, outpatient follow-up with infectious disease.   essential hypertension chronic combined systolic/diastolic congestive heart failure Hx ischemic cardiomyopathy CAD Home regimen includes isosorbide mononitrate 30 mg p.o. daily, carvedilol 3.125 mg p.o. twice daily, and 10 mg p.o. daily.   COPD Not oxygen dependent at baseline. Continue Anoro Ellipta 1 puff daily   HLD Continue statin   Type 2 diabetes mellitus Hemoglobin A1c 6.4 on 08/03/2022, well-controlled.  Currently on Humalog  15 units 3 times daily AC at home.   Peripheral  neuropathy Continue gabapentin   Hx right foot diabetic wound infection and chronic osteomyelitis  Patient recently hospitalized in September 2023 and underwent s/p right foot resection of fourth metatarsal head and base of proximal phalanx fourth toe on 07/20/2022 by podiatry, Dr. Loel Lofty.  No signs of active infection or wounds to right foot. Continue outpatient follow-up with podiatry   ESRD on HD MWF Patient typically dialyzes on a Monday/Wednesday/Friday schedule at [facility] HD clinic.  Patient was seen by nephrology while hospitalized, did undergo hemodialysis on 09/30/2022 on his regular schedule.  Next dialysis scheduled for Monday.   Weakness/debility/deconditioning: Patient lives alone, utilizes a walker at baseline.   Discharge Diagnoses:  Principal Problem:   GI bleed Active Problems:   Human immunodeficiency virus (HIV) disease ([place])   Mixed hyperlipidemia   Essential hypertension   Coronary artery disease involving native heart without angina pectoris   Chronic combined systolic (congestive) and diastolic (congestive) heart failure (HCC)   Ischemic cardiomyopathy   DM (diabetes mellitus) (HCC)   Type 2 diabetes mellitus with diabetic polyneuropathy, with long-term current use of insulin (HCC)   AVM (arteriovenous malformation)   ESRD on hemodialysis [facility])    Discharge Instructions  Discharge Instructions     Call MD for:  difficulty breathing, headache or visual disturbances   Complete by: As directed    Call MD for:  extreme fatigue   Complete by: As directed    Call MD for:  persistant dizziness or light-headedness   Complete by: As directed    Call MD for:  persistant nausea and vomiting   Complete by: As directed    Call MD for:  severe uncontrolled pain   Complete by: As directed    Call MD for:  temperature >100.4   Complete by: As directed    Diet - low sodium heart healthy   Complete by: As directed    Increase activity slowly   Complete by:  As directed       Allergies as of 10/01/2022       Reactions   Augmentin [amoxicillin-pot Clavulanate] Diarrhea, Other (See Comments)   Severe diarrhea   Morphine Sulfate Anaphylaxis   Can't mix with oxycodone    Mucinex Fast-max Other (See Comments)   Intense sweating    Amphetamines Other (See Comments)   Unknown reaction        Medication List     STOP taking these medications    polyethylene glycol 17 g packet Commonly known as: MIRALAX / GLYCOLAX       TAKE these medications    acetaminophen 650 MG CR tablet Commonly known as: TYLENOL Take 1,300 mg by mouth 3 (three) times daily.   allopurinol 100 MG tablet Commonly known as: ZYLOPRIM Take one tablet by mouth once daily.   Anoro Ellipta 62.5-25 MCG/ACT Aepb Generic drug: umeclidinium-vilanterol INHALE 1 PUFF BY MOUTH EVERY DAY What changed: See the new instructions.   atorvastatin 10 MG tablet Commonly known as: LIPITOR TAKE 1 TABLET(10 MG) BY MOUTH DAILY What changed: See the new instructions.   B-D UF III MINI PEN NEEDLES 31G X 5 MM Misc Generic drug: Insulin Pen Needle USE 4 TIMES DAILY   Biktarvy 50-200-25 MG Tabs tablet Generic drug: bictegravir-emtricitabine-tenofovir AF Take 1 tablet by mouth daily.   Calcium Acetate 667 MG Tabs Take 667-2,001 mg by mouth See admin instructions. Take 2001 mg by mouth three times a day  with meals and 667 mg with each snack   carvedilol 3.125 MG tablet Commonly known as: COREG Take 1 tablet (3.125 mg total) by mouth 2 (two) times daily with a meal.   diclofenac Sodium 1 % Gel Commonly known as: VOLTAREN Apply 2 grams topically to the affected area three times daily as needed for pain.   folic acid 1 MG tablet Commonly known as: FOLVITE TAKE 1 TABLET(1 MG) BY MOUTH DAILY   gabapentin 300 MG capsule Commonly known as: NEURONTIN TAKE 1 CAPSULE BY MOUTH DAILY.   insulin lispro 100 UNIT/ML KwikPen Commonly known as: HUMALOG INJECT 20 UNITS UNDER THE  SKIN THREE TIMES DAILY AS NEEDED FOR HIGH BLOOD SUGAR(ABOVE 150) What changed:  how much to take how to take this when to take this additional instructions   isosorbide mononitrate 30 MG 24 hr tablet Commonly known as: IMDUR Take 1 tablet (30 mg total) by mouth daily.   latanoprost 0.005 % ophthalmic solution Commonly known as: XALATAN Place 1 drop into both eyes at bedtime.   lidocaine-prilocaine cream Commonly known as: EMLA Apply 1 application topically every Monday, Wednesday, and Friday with hemodialysis.   multivitamin Tabs tablet Take 1 tablet by mouth daily.   nitroGLYCERIN 0.3 MG SL tablet Commonly known as: NITROSTAT PLACE 1 TABLET UNDER THE TONGUE AS NEEDED FOR CHEST PAIN   ondansetron 4 MG tablet Commonly known as: ZOFRAN TAKE 1 TABLET(4 MG) BY MOUTH EVERY 8 HOURS AS NEEDED FOR NAUSEA OR VOMITING What changed: See the new instructions.   OneTouch Delica Plus [id number] Misc USE 3 TIMES DAILY   OneTouch Ultra test strip Generic drug: glucose blood Check blood sugar three times daily E11.40   oxyCODONE-acetaminophen 5-325 MG tablet Commonly known as: Percocet Take 1 tablet by mouth every 6 (six) hours as needed for severe pain.   SandoSTATIN LAR [place] 10 MG injection Generic drug: octreotide Inject 10 mg into the muscle every 28 days.   Tiger Balm Arthritis Rub 11-11 % Crea Generic drug: Menthol-Camphor Apply 1 Application topically daily as needed (pain).   timolol 0.5 % ophthalmic solution Commonly known as: TIMOPTIC Place 1 drop into both eyes in the morning.        Follow-up Information     [name], MD. Schedule an appointment as soon as possible for a visit in 1 week(s).   Specialties: Family Medicine, Emergency Medicine Contact information: [place] [address] [phone]                Allergies  Allergen Reactions   Augmentin [Amoxicillin-Pot Clavulanate] Diarrhea and Other (See Comments)    Severe  diarrhea   Morphine Sulfate Anaphylaxis    Can't mix with oxycodone    Mucinex Fast-Max Other (See Comments)    Intense sweating    Amphetamines Other (See Comments)    Unknown reaction    Consultations: [facility]   Procedures/Studies: VAS [place] LOWER EXTREMITY VENOUS (DVT) (ONLY [facility] & [facility])  Result Date: 09/30/2022  Lower Venous DVT Study Patient Name:  JAVORIS STAR  Date of Exam:   09/30/2022 Medical Rec #: [id number]        Accession #:    [id number] Date of Birth: Sep 10, 1951         Patient Gender: M Patient Age:   71 years Exam Location:  [facility] Procedure:      VAS [place] LOWER EXTREMITY VENOUS (DVT) Referring Phys: Deno Etienne --------------------------------------------------------------------------------  Indications: Left leg pain  and swelling, osteomyelitis.  Limitations: Acoustic shadowing secondary to overlying arterial calcification. Comparison Study: No prior venous studies. Normal ABI 07-20-2022. Performing Technologist: Darlin Coco RDMS, RVT  Examination Guidelines: A complete evaluation includes B-mode imaging, spectral Doppler, color Doppler, and power Doppler as needed of all accessible portions of each vessel. Bilateral testing is considered an integral part of a complete examination. Limited examinations for reoccurring indications may be performed as noted. The reflux portion of the exam is performed with the patient in reverse Trendelenburg.  +-----+---------------+---------+-----------+----------+--------------+ RIGHTCompressibilityPhasicitySpontaneityPropertiesThrombus Aging +-----+---------------+---------+-----------+----------+--------------+ CFV  Full           Yes      Yes                                 +-----+---------------+---------+-----------+----------+--------------+   +---------+---------------+---------+-----------+----------+--------------+ LEFT     CompressibilityPhasicitySpontaneityPropertiesThrombus Aging  +---------+---------------+---------+-----------+----------+--------------+ CFV      Full           Yes      Yes                                 +---------+---------------+---------+-----------+----------+--------------+ SFJ      Full                                                        +---------+---------------+---------+-----------+----------+--------------+ FV Prox  Full                                                        +---------+---------------+---------+-----------+----------+--------------+ FV Mid   Full                                                        +---------+---------------+---------+-----------+----------+--------------+ FV DistalFull                                                        +---------+---------------+---------+-----------+----------+--------------+ PFV      Full                                                        +---------+---------------+---------+-----------+----------+--------------+ POP      Full           Yes      Yes                                 +---------+---------------+---------+-----------+----------+--------------+ PTV      Full                                                        +---------+---------------+---------+-----------+----------+--------------+  PERO     Full                                                        +---------+---------------+---------+-----------+----------+--------------+    Summary: RIGHT: - No evidence of common femoral vein obstruction.  LEFT: - There is no evidence of deep vein thrombosis in the lower extremity.  - No cystic structure found in the popliteal fossa.  *See table(s) above for measurements and observations. Electronically signed by Servando Snare MD on 09/30/2022 at 5:17:09 PM.    Final    CT ABDOMEN PELVIS WO CONTRAST  Result Date: 09/30/2022 CLINICAL DATA:  There are stools with abdominal distension in left lower leg swelling. Bowel obstruction  suspected. EXAM: CT ABDOMEN AND PELVIS WITHOUT CONTRAST TECHNIQUE: Multidetector CT imaging of the abdomen and pelvis was performed following the standard protocol without IV contrast. RADIATION DOSE REDUCTION: This exam was performed according to the departmental dose-optimization program which includes automated exposure control, adjustment of the mA and/or kV according to patient size and/or use of iterative reconstruction technique. COMPARISON:  01/21/2022 FINDINGS: Lower chest: The cardio pericardial silhouette is enlarged. Coronary artery calcification is evident. Hepatobiliary: No suspicious focal abnormality in the liver on this study without intravenous contrast. Gallbladder is surgically absent. Trace pneumobilia is again noted and compatible with prior sphincterotomy. Pancreas: No focal mass lesion. No dilatation of the main duct. No intraparenchymal cyst. No peripancreatic edema. Spleen: No splenomegaly. No focal mass lesion. Adrenals/Urinary Tract: No adrenal nodule or mass. Punctate nonobstructing stone identified posterior interpolar right kidney on 41/3. Vascular calcification noted in each renal hilum. No evidence for hydroureter. The urinary bladder appears normal for the degree of distention. Stomach/Bowel: Stomach is unremarkable. No gastric wall thickening. No evidence of outlet obstruction. Duodenum is normally positioned as is the ligament of Treitz. No small bowel wall thickening. No small bowel dilatation. The terminal ileum is normal. The appendix is normal. No gross colonic mass. No colonic wall thickening. Diverticular changes are noted in the left colon without evidence of diverticulitis. Vascular/Lymphatic: There is advanced atherosclerotic calcification of the abdominal aorta without aneurysm. There is no gastrohepatic or hepatoduodenal ligament lymphadenopathy. No retroperitoneal or mesenteric lymphadenopathy. No pelvic sidewall lymphadenopathy. Reproductive: The prostate gland and  seminal vesicles are unremarkable. Other: No intraperitoneal free fluid. Musculoskeletal: Small left groin hernia contains only fat. Fixation hardware noted left femoral neck. No worrisome lytic or sclerotic osseous abnormality. Degenerative disc disease noted L4-5 and L5-S1. IMPRESSION: 1. No acute findings in the abdomen or pelvis. Specifically, no evidence for bowel obstruction. 2. Left colonic diverticulosis without diverticulitis. 3. Punctate nonobstructing right renal stone. 4. Small left groin hernia contains only fat. 5.  Aortic Atherosclerosis (ICD10-I70.0). Electronically Signed   By: Misty Stanley M.D.   On: 09/30/2022 07:38   DG Chest 1 View  Result Date: 09/30/2022 CLINICAL DATA:  Shortness of breath. EXAM: CHEST  1 VIEW COMPARISON:  07/18/2022. FINDINGS: The heart is enlarged and the mediastinal contour is stable. Atherosclerotic calcification of the aorta is noted. The pulmonary vasculature is distended. Interstitial prominence is present bilaterally. No consolidation, effusion, or pneumothorax. No acute osseous abnormality. IMPRESSION: 1. Cardiomegaly with pulmonary vascular congestion. 2. Interstitial thickening bilaterally, possible edema or infiltrate. Electronically Signed   By: Brett Fairy M.D.   On: 09/30/2022 04:58  Subjective: Patient seen and examined at bedside, sitting on edge of bed.  On telephone.  Discussed with patient that his hemoglobin is stable and was seen by GI yesterday with no indication for endoscopy at this point.  Patient received hemodialysis last night and is stable for discharge home.  Denies headache, no dizziness, no chest pain, no shortness of breath, no abdominal pain.  Overnight, patient was very belligerent with staff, refusing interventions and assessments; otherwise no other acute issues overnight.  Discharge Exam: Vitals:   10/01/22 0552 10/01/22 0700  BP: (!) 143/71   Pulse: 80   Resp: 18   Temp: 98 F (36.7 C)   SpO2: 97% 97%   Vitals:    10/01/22 0216 10/01/22 0300 10/01/22 0552 10/01/22 0700  BP: (!) 149/82  (!) 143/71   Pulse: 81  80   Resp: 18  18   Temp: 97.8 F (36.6 C)  98 F (36.7 C)   TempSrc: Oral     SpO2: 97%  97% 97%  Weight:  81.3 kg    Height:  '5\' 9"'$  (1.753 m)      Physical Exam: GEN: NAD, alert and oriented x 3, chronically ill in appearance, appears older than stated age HEENT: NCAT, PERRL, EOMI, sclera clear, MMM, poor dentition PULM: CTAB w/o wheezes/crackles, normal respiratory effort, on room air CV: RRR w/o M/G/R GI: abd soft, NTND, NABS, no R/G/M MSK: Trace left lower extremity peripheral edema, moves all extremities independently NEURO: CN II-XII intact, no focal deficits, sensation to light touch slightly decreased distal portion of left lower extremity due to his peripheral neuropathy which is unchanged from his baseline, otherwise sensation to light touch intact PSYCH: normal mood/affect Integumentary: Callus noted to plantar surface right foot, otherwise no other concerning rashes/lesions/wounds noted on exposed skin surfaces.     The results of significant diagnostics from this hospitalization (including imaging, microbiology, ancillary and laboratory) are listed below for reference.     Microbiology: No results found for this or any previous visit (from the past 240 hour(s)).   Labs: BNP (last 3 results) No results for input(s): "BNP" in the last 8760 hours. Basic Metabolic Panel: Recent Labs  Lab 09/30/22 0259 10/01/22 0444  NA 136 137  K 5.4* 4.7  CL 91* 93*  CO2 29 28  GLUCOSE 186* 127*  BUN 98* 53*  CREATININE 10.17* 6.87*  CALCIUM 9.8 8.9   Liver Function Tests: Recent Labs  Lab 09/30/22 0259  AST 20  ALT 11  ALKPHOS 63  BILITOT 0.6  PROT 7.2  ALBUMIN 3.1*   No results for input(s): "LIPASE", "AMYLASE" in the last 168 hours. No results for input(s): "AMMONIA" in the last 168 hours. CBC: Recent Labs  Lab 09/30/22 0259 10/01/22 0444  WBC 5.6 5.2   NEUTROABS 3.4  --   HGB 10.0* 10.8*  HCT 31.4* 33.2*  MCV 107.5* 103.8*  PLT 112* 113*   Cardiac Enzymes: No results for input(s): "CKTOTAL", "CKMB", "CKMBINDEX", "TROPONINI" in the last 168 hours. BNP: Invalid input(s): "POCBNP" CBG: Recent Labs  Lab 10/01/22 0311 10/01/22 0554 10/01/22 0728  GLUCAP 123* 134* 117*   D-Dimer No results for input(s): "DDIMER" in the last 72 hours. Hgb A1c No results for input(s): "HGBA1C" in the last 72 hours. Lipid Profile No results for input(s): "CHOL", "HDL", "LDLCALC", "TRIG", "CHOLHDL", "LDLDIRECT" in the last 72 hours. Thyroid function studies No results for input(s): "TSH", "T4TOTAL", "T3FREE", "THYROIDAB" in the last 72 hours.  Invalid input(s): "FREET3" Anemia  work up [place]    09/30/22 0259 09/30/22 0520 09/30/22 0541 09/30/22 1130  VITAMINB12  --  463  --   --   FOLATE >40.0  --   --   --   FERRITIN  --   --   --  182  TIBC  --   --   --  378  IRON  --   --   --  71  RETICCTPCT  --   --  2.7  --    Urinalysis    Component Value Date/Time   COLORURINE YELLOW 10/16/2020 2136   APPEARANCEUR CLEAR 10/16/2020 2136   LABSPEC 1.016 10/16/2020 2136   PHURINE 5.0 10/16/2020 2136   GLUCOSEU NEGATIVE 10/16/2020 2136   GLUCOSEU NEG mg/dL 09/20/2010 2058   HGBUR SMALL (A) 10/16/2020 2136   HGBUR negative 05/31/2010 0948   BILIRUBINUR NEGATIVE 10/16/2020 2136   [place] NEGATIVE 10/16/2020 2136   PROTEINUR 30 (A) 10/16/2020 2136   UROBILINOGEN 0.2 06/24/2015 2336   NITRITE NEGATIVE 10/16/2020 2136   LEUKOCYTESUR NEGATIVE 10/16/2020 2136   Sepsis Labs Recent Labs  Lab 09/30/22 0259 10/01/22 0444  WBC 5.6 5.2   Microbiology No results found for this or any previous visit (from the past 240 hour(s)).   Time coordinating discharge: Over 30 minutes  SIGNED:   Donnamarie Poag [place] DO  [facility] 10/01/2022, 8:29 AM

## 2022-10-01 NOTE — Plan of Care (Incomplete)
Pt A&OX4, BP (!) 143/71   Pulse 80   Temp 98 F (36.7 C)   Resp 18   Ht '5\' 9"'$  (1.753 m)   Wt 81.3 kg   SpO2 97%   BMI 26.47 kg/m  Pt non-compliant with care. This nurse as well as Doctor explained what was going on. Pt became belligerent with staff. Attempted to review med rec and AVS.  Louanne Skye. 10/01/22 8:47 AM

## 2022-10-01 NOTE — Progress Notes (Addendum)
NEW ADMISSION NOTE   Arrival Method: ED stretcher Mental Orientation: AAOx4 Telemetry: [phone] Assessment: Completed Skin: See flowsheet IV: LFA Pain: 0/10 Tubes: n/a Safety Measures: Safety Fall Prevention Plan has been given, discussed and signed Admission: Completed [facility] Orientation: Patient has been orientated to the room, unit and staff.  Family: none at bedside   Orders have been reviewed and implemented. Will continue to monitor the patient. Call light has been placed within reach and bed alarm has been activated.

## 2022-10-01 NOTE — Progress Notes (Signed)
Pt c/o feeling cold, adjusted thermostat. Offered blankets and hand warmers x4, . Pt refused all. Pt made several complaints regarding NPO status. Pt refused all suggested interventions and further assessments. Education attempted. Will continue to monitor.

## 2022-10-02 LAB — DRUG TOXICOLOGY MONITORING BASE PANEL,W/CONFIRM,ORAL FLD
Amphetamines: NEGATIVE ng/mL (ref <10)
Benzodiazepines: NEGATIVE ng/mL (ref <0.50)
Buprenorphine: NEGATIVE ng/mL (ref <0.10)
Cocaine: NEGATIVE ng/mL (ref <5.0)
Codeine: NEGATIVE ng/mL (ref <2.5)
Dihydrocodeine: NEGATIVE ng/mL (ref <2.5)
Fentanyl: NEGATIVE ng/mL (ref <0.10)
Heroin Metabolite: NEGATIVE ng/mL (ref <1.0)
Hydrocodone: NEGATIVE ng/mL (ref <2.5)
Hydromorphone: NEGATIVE ng/mL (ref <2.5)
MARIJUANA: NEGATIVE ng/mL (ref <2.5)
Methadone: NEGATIVE ng/mL (ref <5.0)
Morphine: NEGATIVE ng/mL (ref <2.5)
Norhydrocodone: NEGATIVE ng/mL (ref <2.5)
Noroxycodone: 5.1 ng/mL — ABNORMAL HIGH (ref <2.5)
Opiates: POSITIVE ng/mL — AB (ref <2.5)
Oxycodone: 42.7 ng/mL — ABNORMAL HIGH (ref <2.5)
Oxymorphone: NEGATIVE ng/mL (ref <2.5)
Tapentadol: NEGATIVE ng/mL (ref <5.0)
Tramadol: NEGATIVE ng/mL (ref <5.0)

## 2022-10-02 LAB — TIQ-NTM

## 2022-10-03 ENCOUNTER — Telehealth: Payer: Self-pay

## 2022-10-03 DIAGNOSIS — M868X8 Other osteomyelitis, other site: Secondary | ICD-10-CM | POA: Diagnosis not present

## 2022-10-03 DIAGNOSIS — N186 End stage renal disease: Secondary | ICD-10-CM | POA: Diagnosis not present

## 2022-10-03 DIAGNOSIS — Z992 Dependence on renal dialysis: Secondary | ICD-10-CM | POA: Diagnosis not present

## 2022-10-03 DIAGNOSIS — D631 Anemia in chronic kidney disease: Secondary | ICD-10-CM | POA: Diagnosis not present

## 2022-10-03 DIAGNOSIS — N2581 Secondary hyperparathyroidism of renal origin: Secondary | ICD-10-CM | POA: Diagnosis not present

## 2022-10-03 LAB — HEPATITIS B SURFACE ANTIBODY, QUANTITATIVE: Hep B S AB Quant (Post): 155.4 m[IU]/mL (ref >9.9)

## 2022-10-03 NOTE — Telephone Encounter (Signed)
Patient was sent to my voicemail x 2 today and left duplicate messages requesting a refill on his oxycodone.  RX was last refilled 11 days ago and it appears that patient request medication approximately 2 times a month. RX pended for Dr.Miller to review and advise on dispense number.   Below is a synopsis of refill request dates for Oxycodone over the last 3 months: 09/20/2022  08/25/2022 08/15/2022 08/03/2022 07/26/2022 07/11/2022 06/28/2022

## 2022-10-04 ENCOUNTER — Ambulatory Visit: Payer: Self-pay

## 2022-10-04 MED ORDER — OXYCODONE-ACETAMINOPHEN 5-325 MG PO TABS: 1.0000 | ORAL_TABLET | Freq: Four times a day (QID) | ORAL | 0 refills | Status: DC | PRN

## 2022-10-04 NOTE — Patient Instructions (Signed)
Visit Information  Thank you for taking time to visit with me today. Please don't hesitate to contact me if I can be of assistance to you.   Following are the goals we discussed today:   Goals Addressed               This Visit's Progress     Patient Stated     I am having pain from a recent fall (pt-stated)        Care Coordination Interventions: Assessed for falls since last encounter Assessed patients knowledge of fall risk prevention secondary to previously provided education Advised patient his PCP approved his outpatient PT Provided patient with the contact name/number and instructed him to call to schedule his initial PT visit when ready          I had a fall in September (pt-stated)        Care Coordination Interventions: Provided written and verbal education re: potential causes of falls and Fall prevention strategies Screening for signs and symptoms of depression related to chronic disease state  Determined LCSW Christa See attempted to contact patient on 09/27/22 without success, patient notified of attempted outreach Determined patient would like a call back, he will be available this Thursday afternoon Sent in basket message to [place] with notification of patient's request for a call back this Thursday afternoon         I need help transferring to another dialysis center (pt-stated)        Care Coordination Interventions: Evaluation of current treatment plan related to ESRD and patient's adherence to plan as established by provider Updated patient with the status of his request to transfer to a new dialysis clinic Advised patient he is on the waiting list for [place] patient his current [facility] is also aware he would like to transfer out once a seat is available at [facility], patient verbalizes understanding         Our next appointment is by telephone on 11/08/22 at 0900 AM  Please call the care guide team  at [phone] if you need to cancel or reschedule your appointment.   If you are experiencing a Mental Health or [place] or need someone to talk to, please call 1-800-273-TALK (toll free, 24 hour hotline)  The patient verbalized understanding of instructions, educational materials, and care plan provided today and agreed to receive a mailed copy of patient instructions, educational materials, and care plan.   Barb Merino, RN, BSN, CCM Care Management Coordinator [facility] Care Management  Direct Phone: [phone]

## 2022-10-04 NOTE — Telephone Encounter (Signed)
Received Drug Screen report and consistent with what patient takes. Called Dr. Sabra Heck to refill Oxycodone for 30 days.   Pended Rx.

## 2022-10-04 NOTE — Patient Outreach (Signed)
  Care Coordination   Follow Up Visit Note   10/04/2022 Name: Jacob [name] MRN: [id number] DOB: 21-Feb-1951  Jacob [name] is a 71 y.o. year old male who sees [name], MD for primary care. I spoke with  [name] by phone today.  What matters to the patients health and wellness today?  Patient will start outpatient PT for strengthening and balance.     Goals Addressed               This Visit's Progress     Patient Stated     I am having pain from a recent fall (pt-stated)        Care Coordination Interventions: Assessed for falls since last encounter Assessed patients knowledge of fall risk prevention secondary to previously provided education Advised patient his PCP approved his outpatient PT Provided patient with the contact name/number and instructed him to call to schedule his initial PT visit when ready          I had a fall in September (pt-stated)        Care Coordination Interventions: Provided written and verbal education re: potential causes of falls and Fall prevention strategies Screening for signs and symptoms of depression related to chronic disease state  Determined LCSW Christa See attempted to contact patient on 09/27/22 without success, patient notified of attempted outreach Determined patient would like a call back, he will be available this Thursday afternoon Sent in basket message to [place] with notification of patient's request for a call back this Thursday afternoon         I need help transferring to another dialysis center (pt-stated)        Care Coordination Interventions: Evaluation of current treatment plan related to ESRD and patient's adherence to plan as established by provider Updated patient with the status of his request to transfer to a new dialysis clinic Advised patient he is on the waiting list for [place] patient his current Kidney Ctr is also aware he would like to  transfer out once a seat is available at [facility], patient verbalizes understanding         SDOH assessments and interventions completed:  No     Care Coordination Interventions:  Yes, provided   Follow up plan: Follow up call scheduled for 11/08/22 '@0900'$  AM    Encounter Outcome:  Pt. Visit Completed

## 2022-10-05 DIAGNOSIS — N186 End stage renal disease: Secondary | ICD-10-CM | POA: Diagnosis not present

## 2022-10-05 DIAGNOSIS — Z992 Dependence on renal dialysis: Secondary | ICD-10-CM | POA: Diagnosis not present

## 2022-10-05 DIAGNOSIS — D631 Anemia in chronic kidney disease: Secondary | ICD-10-CM | POA: Diagnosis not present

## 2022-10-05 DIAGNOSIS — N2581 Secondary hyperparathyroidism of renal origin: Secondary | ICD-10-CM | POA: Diagnosis not present

## 2022-10-05 DIAGNOSIS — M868X8 Other osteomyelitis, other site: Secondary | ICD-10-CM | POA: Diagnosis not present

## 2022-10-06 ENCOUNTER — Other Ambulatory Visit (HOSPITAL_COMMUNITY): Payer: Self-pay

## 2022-10-06 ENCOUNTER — Other Ambulatory Visit: Payer: Self-pay

## 2022-10-06 DIAGNOSIS — N186 End stage renal disease: Secondary | ICD-10-CM | POA: Diagnosis not present

## 2022-10-06 DIAGNOSIS — T82898A Other specified complication of vascular prosthetic devices, implants and grafts, initial encounter: Secondary | ICD-10-CM | POA: Diagnosis not present

## 2022-10-06 DIAGNOSIS — Z992 Dependence on renal dialysis: Secondary | ICD-10-CM | POA: Diagnosis not present

## 2022-10-07 DIAGNOSIS — N186 End stage renal disease: Secondary | ICD-10-CM | POA: Diagnosis not present

## 2022-10-07 DIAGNOSIS — M868X8 Other osteomyelitis, other site: Secondary | ICD-10-CM | POA: Diagnosis not present

## 2022-10-07 DIAGNOSIS — Z992 Dependence on renal dialysis: Secondary | ICD-10-CM | POA: Diagnosis not present

## 2022-10-07 DIAGNOSIS — N2581 Secondary hyperparathyroidism of renal origin: Secondary | ICD-10-CM | POA: Diagnosis not present

## 2022-10-07 DIAGNOSIS — D631 Anemia in chronic kidney disease: Secondary | ICD-10-CM | POA: Diagnosis not present

## 2022-10-10 ENCOUNTER — Other Ambulatory Visit: Payer: Self-pay

## 2022-10-10 ENCOUNTER — Other Ambulatory Visit (HOSPITAL_COMMUNITY): Payer: Self-pay

## 2022-10-10 DIAGNOSIS — Z992 Dependence on renal dialysis: Secondary | ICD-10-CM | POA: Diagnosis not present

## 2022-10-10 DIAGNOSIS — M868X8 Other osteomyelitis, other site: Secondary | ICD-10-CM | POA: Diagnosis not present

## 2022-10-10 DIAGNOSIS — D631 Anemia in chronic kidney disease: Secondary | ICD-10-CM | POA: Diagnosis not present

## 2022-10-10 DIAGNOSIS — N2581 Secondary hyperparathyroidism of renal origin: Secondary | ICD-10-CM | POA: Diagnosis not present

## 2022-10-10 DIAGNOSIS — N186 End stage renal disease: Secondary | ICD-10-CM | POA: Diagnosis not present

## 2022-10-11 ENCOUNTER — Telehealth: Payer: Self-pay | Admitting: *Deleted

## 2022-10-11 ENCOUNTER — Other Ambulatory Visit (HOSPITAL_COMMUNITY): Payer: Self-pay

## 2022-10-11 ENCOUNTER — Telehealth: Payer: Self-pay | Admitting: Licensed Clinical Social Worker

## 2022-10-11 ENCOUNTER — Ambulatory Visit: Payer: Self-pay

## 2022-10-11 ENCOUNTER — Encounter (HOSPITAL_COMMUNITY): Payer: Medicare Other

## 2022-10-11 DIAGNOSIS — H4322 Crystalline deposits in vitreous body, left eye: Secondary | ICD-10-CM | POA: Diagnosis not present

## 2022-10-11 DIAGNOSIS — H25013 Cortical age-related cataract, bilateral: Secondary | ICD-10-CM | POA: Diagnosis not present

## 2022-10-11 DIAGNOSIS — E113293 Type 2 diabetes mellitus with mild nonproliferative diabetic retinopathy without macular edema, bilateral: Secondary | ICD-10-CM | POA: Diagnosis not present

## 2022-10-11 DIAGNOSIS — H401132 Primary open-angle glaucoma, bilateral, moderate stage: Secondary | ICD-10-CM | POA: Diagnosis not present

## 2022-10-11 DIAGNOSIS — H2513 Age-related nuclear cataract, bilateral: Secondary | ICD-10-CM | POA: Diagnosis not present

## 2022-10-11 DIAGNOSIS — H02051 Trichiasis without entropian right upper eyelid: Secondary | ICD-10-CM | POA: Diagnosis not present

## 2022-10-11 DIAGNOSIS — H524 Presbyopia: Secondary | ICD-10-CM | POA: Diagnosis not present

## 2022-10-11 DIAGNOSIS — H5213 Myopia, bilateral: Secondary | ICD-10-CM | POA: Diagnosis not present

## 2022-10-11 LAB — HM DIABETES EYE EXAM

## 2022-10-11 NOTE — Telephone Encounter (Signed)
Jacob [name] called and stated that with his schedule of Dr. Thomasene Lot and Dialysis he cannot make the Physical Therapy appointments work. Stated that he does not have the time to go to them.   Jacob [name] stated that he will discuss this further with you at his next appointment.   FYI

## 2022-10-11 NOTE — Patient Outreach (Signed)
  Care Coordination   10/11/2022 Name: Jacob [name] MRN: [id number] DOB: 01-Apr-1951   Care Coordination Outreach Attempts:  An unsuccessful telephone outreach was attempted for a scheduled appointment today.  Follow Up Plan:  Additional outreach attempts will be made to offer the patient care coordination information and services.   Encounter Outcome:  No Answer   Care Coordination Interventions:  No, not indicated    Barb Merino, RN, BSN, CCM Care Management Coordinator [facility] Care Management  Direct Phone: [phone]

## 2022-10-12 ENCOUNTER — Encounter (HOSPITAL_COMMUNITY): Payer: Medicare Other

## 2022-10-12 DIAGNOSIS — M868X8 Other osteomyelitis, other site: Secondary | ICD-10-CM | POA: Diagnosis not present

## 2022-10-12 DIAGNOSIS — Z992 Dependence on renal dialysis: Secondary | ICD-10-CM | POA: Diagnosis not present

## 2022-10-12 DIAGNOSIS — D631 Anemia in chronic kidney disease: Secondary | ICD-10-CM | POA: Diagnosis not present

## 2022-10-12 DIAGNOSIS — N186 End stage renal disease: Secondary | ICD-10-CM | POA: Diagnosis not present

## 2022-10-12 DIAGNOSIS — N2581 Secondary hyperparathyroidism of renal origin: Secondary | ICD-10-CM | POA: Diagnosis not present

## 2022-10-13 ENCOUNTER — Ambulatory Visit (HOSPITAL_COMMUNITY)
Admission: RE | Admit: 2022-10-13 | Discharge: 2022-10-13 | Disposition: A | Discharge status: Home / Self Care | Payer: Medicare Other | Source: Ambulatory Visit | Attending: Gastroenterology | Admitting: Gastroenterology

## 2022-10-13 DIAGNOSIS — Q2730 Arteriovenous malformation, site unspecified: Secondary | ICD-10-CM | POA: Insufficient documentation

## 2022-10-13 MED ORDER — OCTREOTIDE ACETATE 20 MG IM KIT
10.0000 mg | PACK | INTRAMUSCULAR | Status: DC
  Administered 2022-10-13: 10 mg via INTRAMUSCULAR
  Filled 2022-10-13: qty 1

## 2022-10-14 DIAGNOSIS — N186 End stage renal disease: Secondary | ICD-10-CM | POA: Diagnosis not present

## 2022-10-14 DIAGNOSIS — N2581 Secondary hyperparathyroidism of renal origin: Secondary | ICD-10-CM | POA: Diagnosis not present

## 2022-10-14 DIAGNOSIS — M868X8 Other osteomyelitis, other site: Secondary | ICD-10-CM | POA: Diagnosis not present

## 2022-10-14 DIAGNOSIS — D631 Anemia in chronic kidney disease: Secondary | ICD-10-CM | POA: Diagnosis not present

## 2022-10-14 DIAGNOSIS — Z992 Dependence on renal dialysis: Secondary | ICD-10-CM | POA: Diagnosis not present

## 2022-10-14 NOTE — Patient Instructions (Signed)
Visit Information  Thank you for taking time to visit with me today. Please don't hesitate to contact me if I can be of assistance to you.   Following are the goals we discussed today:   Goals Addressed             This Visit's Progress    COMPLETED: LCSW Care Coordination Activities-No Follow Up Required       Care Coordination Interventions: Solution-Focused Strategies employed:  Active listening / Reflection utilized  Emotional Support Provided Verbalization of feelings encouraged  Pt reports that he is scheduled to have cataract surgery in January 2024 Pt endorses feelings of overwhelm with PCP's recommendation to participate in PT noting he is unable to with current appts, such as, dialysis. Pt agreed to discuss with PCP when he is ready for [facility] PT Pt utilizes [employer] [place] for transportation. His Medicare transportation will renew in 2024 LCSW informed pt of role and ability to provide emotional support to assist with management of stressors and/or mental health conditions. Patient is not interested in LCSW services, at this time. He has agreed to follow up with RNCM, if any needs arise LCSW sent in basket message to [facility], informing her of update         If you are experiencing a Mental Health or [place] or need someone to talk to, please call the Suicide and Crisis Lifeline: 988 call 911   The patient verbalized understanding of instructions, educational materials, and care plan provided today and DECLINED offer to receive copy of patient instructions, educational materials, and care plan.   No further follow up required: Pt is not interested in LCSW f/up  Christa See, MSW, [email or web address] Phone [phone] 7:29 AM

## 2022-10-14 NOTE — Patient Outreach (Signed)
  Care Coordination Late Entry/Documentation  Initial Visit Note   Outreach completed on 10/11/22 Name: Jacob [name] MRN: [id number] DOB: [birth year]  Jacob [name] is a 71 y.o. year old male who sees Jacob Honour, MD for primary care. I spoke with  Jacob [name] by phone today.  What matters to the patients health and wellness today?  Care Coordination    Goals Addressed             This Visit's Progress    COMPLETED: LCSW Care Coordination Activities-No Follow Up Required       Care Coordination Interventions: Solution-Focused Strategies employed:  Active listening / Reflection utilized  Emotional Support Provided Verbalization of feelings encouraged  Pt reports that he is scheduled to have cataract surgery in January 2024 Pt endorses feelings of overwhelm with PCP's recommendation to participate in PT noting he is unable to with current appts, such as, dialysis. Pt agreed to discuss with PCP when he is ready for [name] PT Pt utilizes [employer] [place] for transportation. His Medicare transportation will renew in 2024 LCSW informed pt of role and ability to provide emotional support to assist with management of stressors and/or mental health conditions. Patient is not interested in LCSW services, at this time. He has agreed to follow up with RNCM, if any needs arise LCSW sent in basket message to Jacob [name], informing her of update         SDOH assessments and interventions completed:  No     Care Coordination Interventions:  Yes, provided   Follow up plan: No further intervention required. Pt will continue f/up with RNCM  Encounter Outcome:  Pt. Visit Completed   Jacob [name], MSW, [name] Phone [phone] 7:29 AM

## 2022-10-16 DIAGNOSIS — N186 End stage renal disease: Secondary | ICD-10-CM | POA: Diagnosis not present

## 2022-10-16 DIAGNOSIS — M868X8 Other osteomyelitis, other site: Secondary | ICD-10-CM | POA: Diagnosis not present

## 2022-10-16 DIAGNOSIS — Z0100 Encounter for examination of eyes and vision without abnormal findings: Secondary | ICD-10-CM | POA: Diagnosis not present

## 2022-10-16 DIAGNOSIS — Z992 Dependence on renal dialysis: Secondary | ICD-10-CM | POA: Diagnosis not present

## 2022-10-16 DIAGNOSIS — D631 Anemia in chronic kidney disease: Secondary | ICD-10-CM | POA: Diagnosis not present

## 2022-10-16 DIAGNOSIS — N2581 Secondary hyperparathyroidism of renal origin: Secondary | ICD-10-CM | POA: Diagnosis not present

## 2022-10-20 DIAGNOSIS — I4510 Unspecified right bundle-branch block: Secondary | ICD-10-CM | POA: Diagnosis not present

## 2022-10-20 DIAGNOSIS — R079 Chest pain, unspecified: Secondary | ICD-10-CM | POA: Diagnosis not present

## 2022-10-20 DIAGNOSIS — R0789 Other chest pain: Secondary | ICD-10-CM | POA: Diagnosis not present

## 2022-10-20 DIAGNOSIS — Z743 Need for continuous supervision: Secondary | ICD-10-CM | POA: Diagnosis not present

## 2022-10-21 DIAGNOSIS — N2581 Secondary hyperparathyroidism of renal origin: Secondary | ICD-10-CM | POA: Diagnosis not present

## 2022-10-21 DIAGNOSIS — Z992 Dependence on renal dialysis: Secondary | ICD-10-CM | POA: Diagnosis not present

## 2022-10-21 DIAGNOSIS — D631 Anemia in chronic kidney disease: Secondary | ICD-10-CM | POA: Diagnosis not present

## 2022-10-21 DIAGNOSIS — N186 End stage renal disease: Secondary | ICD-10-CM | POA: Diagnosis not present

## 2022-10-21 DIAGNOSIS — M868X8 Other osteomyelitis, other site: Secondary | ICD-10-CM | POA: Diagnosis not present

## 2022-10-23 DIAGNOSIS — I129 Hypertensive chronic kidney disease with stage 1 through stage 4 chronic kidney disease, or unspecified chronic kidney disease: Secondary | ICD-10-CM | POA: Diagnosis not present

## 2022-10-23 DIAGNOSIS — M868X8 Other osteomyelitis, other site: Secondary | ICD-10-CM | POA: Diagnosis not present

## 2022-10-23 DIAGNOSIS — N2581 Secondary hyperparathyroidism of renal origin: Secondary | ICD-10-CM | POA: Diagnosis not present

## 2022-10-23 DIAGNOSIS — Z992 Dependence on renal dialysis: Secondary | ICD-10-CM | POA: Diagnosis not present

## 2022-10-23 DIAGNOSIS — D631 Anemia in chronic kidney disease: Secondary | ICD-10-CM | POA: Diagnosis not present

## 2022-10-23 DIAGNOSIS — N186 End stage renal disease: Secondary | ICD-10-CM | POA: Diagnosis not present

## 2022-10-26 ENCOUNTER — Telehealth: Payer: Self-pay

## 2022-10-26 DIAGNOSIS — M868X8 Other osteomyelitis, other site: Secondary | ICD-10-CM | POA: Diagnosis not present

## 2022-10-26 DIAGNOSIS — Z992 Dependence on renal dialysis: Secondary | ICD-10-CM | POA: Diagnosis not present

## 2022-10-26 DIAGNOSIS — N2581 Secondary hyperparathyroidism of renal origin: Secondary | ICD-10-CM | POA: Diagnosis not present

## 2022-10-26 DIAGNOSIS — E1122 Type 2 diabetes mellitus with diabetic chronic kidney disease: Secondary | ICD-10-CM | POA: Diagnosis not present

## 2022-10-26 DIAGNOSIS — N186 End stage renal disease: Secondary | ICD-10-CM | POA: Diagnosis not present

## 2022-10-26 DIAGNOSIS — D631 Anemia in chronic kidney disease: Secondary | ICD-10-CM | POA: Diagnosis not present

## 2022-10-26 NOTE — Telephone Encounter (Signed)
Patient called in regards to allergies

## 2022-10-27 DIAGNOSIS — H2512 Age-related nuclear cataract, left eye: Secondary | ICD-10-CM | POA: Diagnosis not present

## 2022-10-27 DIAGNOSIS — H25012 Cortical age-related cataract, left eye: Secondary | ICD-10-CM | POA: Diagnosis not present

## 2022-10-27 DIAGNOSIS — H268 Other specified cataract: Secondary | ICD-10-CM | POA: Diagnosis not present

## 2022-10-27 DIAGNOSIS — H269 Unspecified cataract: Secondary | ICD-10-CM | POA: Diagnosis not present

## 2022-10-27 LAB — HM DIABETES EYE EXAM

## 2022-10-28 DIAGNOSIS — Z992 Dependence on renal dialysis: Secondary | ICD-10-CM | POA: Diagnosis not present

## 2022-10-28 DIAGNOSIS — M868X8 Other osteomyelitis, other site: Secondary | ICD-10-CM | POA: Diagnosis not present

## 2022-10-28 DIAGNOSIS — E1122 Type 2 diabetes mellitus with diabetic chronic kidney disease: Secondary | ICD-10-CM | POA: Diagnosis not present

## 2022-10-28 DIAGNOSIS — N2581 Secondary hyperparathyroidism of renal origin: Secondary | ICD-10-CM | POA: Diagnosis not present

## 2022-10-28 DIAGNOSIS — D631 Anemia in chronic kidney disease: Secondary | ICD-10-CM | POA: Diagnosis not present

## 2022-10-28 DIAGNOSIS — N186 End stage renal disease: Secondary | ICD-10-CM | POA: Diagnosis not present

## 2022-10-31 DIAGNOSIS — N186 End stage renal disease: Secondary | ICD-10-CM | POA: Diagnosis not present

## 2022-10-31 DIAGNOSIS — M868X8 Other osteomyelitis, other site: Secondary | ICD-10-CM | POA: Diagnosis not present

## 2022-10-31 DIAGNOSIS — Z992 Dependence on renal dialysis: Secondary | ICD-10-CM | POA: Diagnosis not present

## 2022-10-31 DIAGNOSIS — N2581 Secondary hyperparathyroidism of renal origin: Secondary | ICD-10-CM | POA: Diagnosis not present

## 2022-10-31 DIAGNOSIS — D631 Anemia in chronic kidney disease: Secondary | ICD-10-CM | POA: Diagnosis not present

## 2022-10-31 DIAGNOSIS — E1122 Type 2 diabetes mellitus with diabetic chronic kidney disease: Secondary | ICD-10-CM | POA: Diagnosis not present

## 2022-11-01 ENCOUNTER — Encounter (HOSPITAL_COMMUNITY): Payer: Self-pay

## 2022-11-02 DIAGNOSIS — N2581 Secondary hyperparathyroidism of renal origin: Secondary | ICD-10-CM | POA: Diagnosis not present

## 2022-11-02 DIAGNOSIS — M868X8 Other osteomyelitis, other site: Secondary | ICD-10-CM | POA: Diagnosis not present

## 2022-11-02 DIAGNOSIS — D631 Anemia in chronic kidney disease: Secondary | ICD-10-CM | POA: Diagnosis not present

## 2022-11-02 DIAGNOSIS — E1122 Type 2 diabetes mellitus with diabetic chronic kidney disease: Secondary | ICD-10-CM | POA: Diagnosis not present

## 2022-11-02 DIAGNOSIS — Z992 Dependence on renal dialysis: Secondary | ICD-10-CM | POA: Diagnosis not present

## 2022-11-02 DIAGNOSIS — N186 End stage renal disease: Secondary | ICD-10-CM | POA: Diagnosis not present

## 2022-11-04 DIAGNOSIS — N2581 Secondary hyperparathyroidism of renal origin: Secondary | ICD-10-CM | POA: Diagnosis not present

## 2022-11-04 DIAGNOSIS — M868X8 Other osteomyelitis, other site: Secondary | ICD-10-CM | POA: Diagnosis not present

## 2022-11-04 DIAGNOSIS — N186 End stage renal disease: Secondary | ICD-10-CM | POA: Diagnosis not present

## 2022-11-04 DIAGNOSIS — D631 Anemia in chronic kidney disease: Secondary | ICD-10-CM | POA: Diagnosis not present

## 2022-11-04 DIAGNOSIS — Z992 Dependence on renal dialysis: Secondary | ICD-10-CM | POA: Diagnosis not present

## 2022-11-04 DIAGNOSIS — E1122 Type 2 diabetes mellitus with diabetic chronic kidney disease: Secondary | ICD-10-CM | POA: Diagnosis not present

## 2022-11-07 DIAGNOSIS — D631 Anemia in chronic kidney disease: Secondary | ICD-10-CM | POA: Diagnosis not present

## 2022-11-07 DIAGNOSIS — M868X8 Other osteomyelitis, other site: Secondary | ICD-10-CM | POA: Diagnosis not present

## 2022-11-07 DIAGNOSIS — E1122 Type 2 diabetes mellitus with diabetic chronic kidney disease: Secondary | ICD-10-CM | POA: Diagnosis not present

## 2022-11-07 DIAGNOSIS — N2581 Secondary hyperparathyroidism of renal origin: Secondary | ICD-10-CM | POA: Diagnosis not present

## 2022-11-07 DIAGNOSIS — N186 End stage renal disease: Secondary | ICD-10-CM | POA: Diagnosis not present

## 2022-11-07 DIAGNOSIS — Z992 Dependence on renal dialysis: Secondary | ICD-10-CM | POA: Diagnosis not present

## 2022-11-08 ENCOUNTER — Ambulatory Visit: Payer: Self-pay

## 2022-11-08 ENCOUNTER — Other Ambulatory Visit (HOSPITAL_COMMUNITY): Payer: Self-pay

## 2022-11-08 ENCOUNTER — Other Ambulatory Visit: Payer: Self-pay | Admitting: Cardiovascular Disease

## 2022-11-08 ENCOUNTER — Other Ambulatory Visit: Payer: Self-pay

## 2022-11-08 NOTE — Patient Outreach (Signed)
  Care Coordination   11/08/2022 Name: ADRIELL POLANSKY MRN: [id number] DOB: 1951-06-13   Care Coordination Outreach Attempts:  An unsuccessful telephone outreach was attempted for a scheduled appointment today.  Follow Up Plan:  Additional outreach attempts will be made to offer the patient care coordination information and services.   Encounter Outcome:  No Answer   Care Coordination Interventions:  No, not indicated    Barb Merino, RN, BSN, CCM Care Management Coordinator [facility] Care Management Direct Phone: [phone]

## 2022-11-09 ENCOUNTER — Other Ambulatory Visit (HOSPITAL_COMMUNITY): Payer: Self-pay

## 2022-11-09 DIAGNOSIS — D631 Anemia in chronic kidney disease: Secondary | ICD-10-CM | POA: Diagnosis not present

## 2022-11-09 DIAGNOSIS — N2581 Secondary hyperparathyroidism of renal origin: Secondary | ICD-10-CM | POA: Diagnosis not present

## 2022-11-09 DIAGNOSIS — E1122 Type 2 diabetes mellitus with diabetic chronic kidney disease: Secondary | ICD-10-CM | POA: Diagnosis not present

## 2022-11-09 DIAGNOSIS — N186 End stage renal disease: Secondary | ICD-10-CM | POA: Diagnosis not present

## 2022-11-09 DIAGNOSIS — Z992 Dependence on renal dialysis: Secondary | ICD-10-CM | POA: Diagnosis not present

## 2022-11-09 DIAGNOSIS — M868X8 Other osteomyelitis, other site: Secondary | ICD-10-CM | POA: Diagnosis not present

## 2022-11-09 NOTE — Progress Notes (Signed)
Rescheduled 11/29/22  [place]  Direct Dial: [phone]

## 2022-11-10 ENCOUNTER — Ambulatory Visit (HOSPITAL_COMMUNITY)
Admission: RE | Admit: 2022-11-10 | Discharge: 2022-11-10 | Disposition: A | Discharge status: Home / Self Care | Payer: 59 | Source: Ambulatory Visit | Attending: Gastroenterology | Admitting: Gastroenterology

## 2022-11-10 ENCOUNTER — Other Ambulatory Visit: Payer: Self-pay

## 2022-11-10 DIAGNOSIS — Q2730 Arteriovenous malformation, site unspecified: Secondary | ICD-10-CM | POA: Insufficient documentation

## 2022-11-10 MED ORDER — OCTREOTIDE ACETATE 20 MG IM KIT
10.0000 mg | PACK | INTRAMUSCULAR | Status: DC
  Administered 2022-11-10: 10 mg via INTRAMUSCULAR
  Filled 2022-11-10: qty 1

## 2022-11-11 ENCOUNTER — Encounter (HOSPITAL_COMMUNITY): Payer: Self-pay

## 2022-11-11 DIAGNOSIS — D631 Anemia in chronic kidney disease: Secondary | ICD-10-CM | POA: Diagnosis not present

## 2022-11-11 DIAGNOSIS — Z992 Dependence on renal dialysis: Secondary | ICD-10-CM | POA: Diagnosis not present

## 2022-11-11 DIAGNOSIS — M868X8 Other osteomyelitis, other site: Secondary | ICD-10-CM | POA: Diagnosis not present

## 2022-11-11 DIAGNOSIS — N2581 Secondary hyperparathyroidism of renal origin: Secondary | ICD-10-CM | POA: Diagnosis not present

## 2022-11-11 DIAGNOSIS — E1122 Type 2 diabetes mellitus with diabetic chronic kidney disease: Secondary | ICD-10-CM | POA: Diagnosis not present

## 2022-11-11 DIAGNOSIS — N186 End stage renal disease: Secondary | ICD-10-CM | POA: Diagnosis not present

## 2022-11-14 ENCOUNTER — Other Ambulatory Visit (HOSPITAL_COMMUNITY): Payer: Self-pay

## 2022-11-14 DIAGNOSIS — N186 End stage renal disease: Secondary | ICD-10-CM | POA: Diagnosis not present

## 2022-11-14 DIAGNOSIS — D631 Anemia in chronic kidney disease: Secondary | ICD-10-CM | POA: Diagnosis not present

## 2022-11-14 DIAGNOSIS — E1122 Type 2 diabetes mellitus with diabetic chronic kidney disease: Secondary | ICD-10-CM | POA: Diagnosis not present

## 2022-11-14 DIAGNOSIS — N2581 Secondary hyperparathyroidism of renal origin: Secondary | ICD-10-CM | POA: Diagnosis not present

## 2022-11-14 DIAGNOSIS — M868X8 Other osteomyelitis, other site: Secondary | ICD-10-CM | POA: Diagnosis not present

## 2022-11-14 DIAGNOSIS — Z992 Dependence on renal dialysis: Secondary | ICD-10-CM | POA: Diagnosis not present

## 2022-11-16 DIAGNOSIS — N2581 Secondary hyperparathyroidism of renal origin: Secondary | ICD-10-CM | POA: Diagnosis not present

## 2022-11-16 DIAGNOSIS — N186 End stage renal disease: Secondary | ICD-10-CM | POA: Diagnosis not present

## 2022-11-16 DIAGNOSIS — D631 Anemia in chronic kidney disease: Secondary | ICD-10-CM | POA: Diagnosis not present

## 2022-11-16 DIAGNOSIS — M868X8 Other osteomyelitis, other site: Secondary | ICD-10-CM | POA: Diagnosis not present

## 2022-11-16 DIAGNOSIS — E1122 Type 2 diabetes mellitus with diabetic chronic kidney disease: Secondary | ICD-10-CM | POA: Diagnosis not present

## 2022-11-16 DIAGNOSIS — Z992 Dependence on renal dialysis: Secondary | ICD-10-CM | POA: Diagnosis not present

## 2022-11-17 ENCOUNTER — Other Ambulatory Visit: Payer: Self-pay | Admitting: *Deleted

## 2022-11-17 NOTE — Telephone Encounter (Signed)
Patient requested refill.  Epic LR: 10/04/2022 Contract Date: 09/07/2022  Pended Rx and sent to Dr. Sabra Heck for approval.

## 2022-11-18 DIAGNOSIS — E1122 Type 2 diabetes mellitus with diabetic chronic kidney disease: Secondary | ICD-10-CM | POA: Diagnosis not present

## 2022-11-18 DIAGNOSIS — M868X8 Other osteomyelitis, other site: Secondary | ICD-10-CM | POA: Diagnosis not present

## 2022-11-18 DIAGNOSIS — N2581 Secondary hyperparathyroidism of renal origin: Secondary | ICD-10-CM | POA: Diagnosis not present

## 2022-11-18 DIAGNOSIS — D631 Anemia in chronic kidney disease: Secondary | ICD-10-CM | POA: Diagnosis not present

## 2022-11-18 DIAGNOSIS — N186 End stage renal disease: Secondary | ICD-10-CM | POA: Diagnosis not present

## 2022-11-18 DIAGNOSIS — Z992 Dependence on renal dialysis: Secondary | ICD-10-CM | POA: Diagnosis not present

## 2022-11-18 MED ORDER — OXYCODONE-ACETAMINOPHEN 5-325 MG PO TABS: 1.0000 | ORAL_TABLET | Freq: Four times a day (QID) | ORAL | 0 refills | Status: DC | PRN

## 2022-11-18 NOTE — Telephone Encounter (Signed)
Patient notified that Rx was sent to pharmacy.

## 2022-11-21 ENCOUNTER — Other Ambulatory Visit (HOSPITAL_COMMUNITY): Payer: Self-pay

## 2022-11-21 DIAGNOSIS — E1122 Type 2 diabetes mellitus with diabetic chronic kidney disease: Secondary | ICD-10-CM | POA: Diagnosis not present

## 2022-11-21 DIAGNOSIS — N2581 Secondary hyperparathyroidism of renal origin: Secondary | ICD-10-CM | POA: Diagnosis not present

## 2022-11-21 DIAGNOSIS — Z992 Dependence on renal dialysis: Secondary | ICD-10-CM | POA: Diagnosis not present

## 2022-11-21 DIAGNOSIS — M868X8 Other osteomyelitis, other site: Secondary | ICD-10-CM | POA: Diagnosis not present

## 2022-11-21 DIAGNOSIS — N186 End stage renal disease: Secondary | ICD-10-CM | POA: Diagnosis not present

## 2022-11-21 DIAGNOSIS — D631 Anemia in chronic kidney disease: Secondary | ICD-10-CM | POA: Diagnosis not present

## 2022-11-23 DIAGNOSIS — E1122 Type 2 diabetes mellitus with diabetic chronic kidney disease: Secondary | ICD-10-CM | POA: Diagnosis not present

## 2022-11-23 DIAGNOSIS — D631 Anemia in chronic kidney disease: Secondary | ICD-10-CM | POA: Diagnosis not present

## 2022-11-23 DIAGNOSIS — N2581 Secondary hyperparathyroidism of renal origin: Secondary | ICD-10-CM | POA: Diagnosis not present

## 2022-11-23 DIAGNOSIS — N186 End stage renal disease: Secondary | ICD-10-CM | POA: Diagnosis not present

## 2022-11-23 DIAGNOSIS — M868X8 Other osteomyelitis, other site: Secondary | ICD-10-CM | POA: Diagnosis not present

## 2022-11-23 DIAGNOSIS — Z992 Dependence on renal dialysis: Secondary | ICD-10-CM | POA: Diagnosis not present

## 2022-11-23 DIAGNOSIS — I129 Hypertensive chronic kidney disease with stage 1 through stage 4 chronic kidney disease, or unspecified chronic kidney disease: Secondary | ICD-10-CM | POA: Diagnosis not present

## 2022-11-25 DIAGNOSIS — E1122 Type 2 diabetes mellitus with diabetic chronic kidney disease: Secondary | ICD-10-CM | POA: Diagnosis not present

## 2022-11-25 DIAGNOSIS — D631 Anemia in chronic kidney disease: Secondary | ICD-10-CM | POA: Diagnosis not present

## 2022-11-25 DIAGNOSIS — N186 End stage renal disease: Secondary | ICD-10-CM | POA: Diagnosis not present

## 2022-11-25 DIAGNOSIS — N2581 Secondary hyperparathyroidism of renal origin: Secondary | ICD-10-CM | POA: Diagnosis not present

## 2022-11-25 DIAGNOSIS — Z992 Dependence on renal dialysis: Secondary | ICD-10-CM | POA: Diagnosis not present

## 2022-11-25 DIAGNOSIS — M868X8 Other osteomyelitis, other site: Secondary | ICD-10-CM | POA: Diagnosis not present

## 2022-11-28 DIAGNOSIS — E1122 Type 2 diabetes mellitus with diabetic chronic kidney disease: Secondary | ICD-10-CM | POA: Diagnosis not present

## 2022-11-28 DIAGNOSIS — N186 End stage renal disease: Secondary | ICD-10-CM | POA: Diagnosis not present

## 2022-11-28 DIAGNOSIS — M868X8 Other osteomyelitis, other site: Secondary | ICD-10-CM | POA: Diagnosis not present

## 2022-11-28 DIAGNOSIS — D631 Anemia in chronic kidney disease: Secondary | ICD-10-CM | POA: Diagnosis not present

## 2022-11-28 DIAGNOSIS — N2581 Secondary hyperparathyroidism of renal origin: Secondary | ICD-10-CM | POA: Diagnosis not present

## 2022-11-28 DIAGNOSIS — Z992 Dependence on renal dialysis: Secondary | ICD-10-CM | POA: Diagnosis not present

## 2022-11-29 ENCOUNTER — Ambulatory Visit: Payer: Self-pay

## 2022-11-29 NOTE — Patient Outreach (Signed)
  Care Coordination   Follow Up Visit Note   11/29/2022 Name: Jacob [name] MRN: [id number] DOB: Oct 08, 1951  Jacob [name] is a 72 y.o. year old male who sees [name], MD for primary care. I spoke with  Jacob [name] by phone today.  What matters to the patients health and wellness today?  Patient would like to participate in outpatient PT, he will contact the provider. Patient is working with the dialysis administrator to help with his transfer.     Goals Addressed               This Visit's Progress     Patient Stated     I am having pain from a recent fall (pt-stated)        Care Coordination Interventions: Assessed for falls since last encounter Discussed with patient his refusal for outpatient PT from previous referral Determined patient was unable to attend therapy due to having his hemodialysis treatments as well as well additional MD visits  Discussed with patient the contact number for the PT provider and directed patient to contact this provider to request assistance with getting him scheduled  Discussed patient plans to contact this provider and will discuss his options for attendance around his HD txts Reviewed scheduled/upcoming provider appointment including: next PCP follow up appointment scheduled for 02/22/23 '@2'$  PM         I need help transferring to another dialysis center (pt-stated)        Care Coordination Interventions: Evaluation of current treatment plan related to ESRD and patient's adherence to plan as established by provider Determined patient was informed of a time opening at his desired dialysis center Determine patient is awaiting a call from Jacob [name] administrator for the desired clinic to further discuss his transfer        SDOH assessments and interventions completed:  No     Care Coordination Interventions:  Yes, provided   Follow up plan: Follow up call scheduled for 02/28/23 '@09'$ :30 AM    Encounter Outcome:  Pt. Visit  Completed

## 2022-11-29 NOTE — Patient Instructions (Signed)
Visit Information  Thank you for taking time to visit with me today. Please don't hesitate to contact me if I can be of assistance to you.   Following are the goals we discussed today:   Goals Addressed               This Visit's Progress     Patient Stated     I am having pain from a recent fall (pt-stated)        Care Coordination Interventions: Assessed for falls since last encounter Discussed with patient his refusal for outpatient PT from previous referral Determined patient was unable to attend therapy due to having his hemodialysis treatments as well as well additional MD visits  Discussed with patient the contact number for the PT provider and directed patient to contact this provider to request assistance with getting him scheduled  Discussed patient plans to contact this provider and will discuss his options for attendance around his HD txts Reviewed scheduled/upcoming provider appointment including: next PCP follow up appointment scheduled for 02/22/23 '@2'$  PM         I need help transferring to another dialysis center (pt-stated)        Care Coordination Interventions: Evaluation of current treatment plan related to ESRD and patient's adherence to plan as established by provider Determined patient was informed of a time opening at his desired dialysis center Determine patient is awaiting a call from Lambert Keto administrator for the desired clinic to further discuss his transfer        [place] next appointment is by telephone on 02/28/23 at 09:30 AM  Please call the care guide team at [phone] if you need to cancel or reschedule your appointment.   If you are experiencing a Mental Health or [place] or need someone to talk to, please go to [facility] [phone])  The patient verbalized understanding of instructions, educational materials, and care plan provided today and DECLINED offer  to receive copy of patient instructions, educational materials, and care plan.

## 2022-11-30 DIAGNOSIS — D631 Anemia in chronic kidney disease: Secondary | ICD-10-CM | POA: Diagnosis not present

## 2022-11-30 DIAGNOSIS — N2581 Secondary hyperparathyroidism of renal origin: Secondary | ICD-10-CM | POA: Diagnosis not present

## 2022-11-30 DIAGNOSIS — N186 End stage renal disease: Secondary | ICD-10-CM | POA: Diagnosis not present

## 2022-11-30 DIAGNOSIS — M868X8 Other osteomyelitis, other site: Secondary | ICD-10-CM | POA: Diagnosis not present

## 2022-11-30 DIAGNOSIS — E1122 Type 2 diabetes mellitus with diabetic chronic kidney disease: Secondary | ICD-10-CM | POA: Diagnosis not present

## 2022-11-30 DIAGNOSIS — Z992 Dependence on renal dialysis: Secondary | ICD-10-CM | POA: Diagnosis not present

## 2022-12-01 ENCOUNTER — Other Ambulatory Visit: Payer: Self-pay

## 2022-12-01 ENCOUNTER — Ambulatory Visit (INDEPENDENT_AMBULATORY_CARE_PROVIDER_SITE_OTHER): Payer: 59

## 2022-12-01 ENCOUNTER — Ambulatory Visit (INDEPENDENT_AMBULATORY_CARE_PROVIDER_SITE_OTHER): Payer: 59 | Admitting: Podiatry

## 2022-12-01 DIAGNOSIS — B353 Tinea pedis: Secondary | ICD-10-CM | POA: Diagnosis not present

## 2022-12-01 DIAGNOSIS — B351 Tinea unguium: Secondary | ICD-10-CM

## 2022-12-01 DIAGNOSIS — M79674 Pain in right toe(s): Secondary | ICD-10-CM | POA: Diagnosis not present

## 2022-12-01 DIAGNOSIS — L97514 Non-pressure chronic ulcer of other part of right foot with necrosis of bone: Secondary | ICD-10-CM

## 2022-12-01 DIAGNOSIS — M79675 Pain in left toe(s): Secondary | ICD-10-CM | POA: Diagnosis not present

## 2022-12-01 MED ORDER — KETOCONAZOLE 2 % EX CREA: 1.0000 | TOPICAL_CREAM | Freq: Every day | CUTANEOUS | 0 refills | Status: DC

## 2022-12-01 NOTE — Progress Notes (Signed)
Subjective:  [name], male    DOB: 1951/10/06,  MRN: [id number]  Chief Complaint  Patient presents with   Diabetes    Patient is concerned  about his left foot. He states that his left foot is starting to turn black.     72 y.o. male presents concern primarily with a rash and some dry skin at the top of the left foot.  He wants to make sure that it is not infection.  He denies any open wound in the left foot.  Says there is some pain with the area he has been applying Tiger balm to the area.  He also states the right foot has been doing well no issues with the prior surgical site. has been off antibiotics and not going to wound care as he does not have any wound present at the area anymore.  Past Medical History:  Diagnosis Date   Acute respiratory failure ([place]) 03/01/2018   Anemia    Arthritis    "all over; mostly knees and back" (02/28/2018)   Chronic combined systolic and diastolic CHF (congestive heart failure) (HCC)    Chronic lower back pain    stenosis   Community acquired pneumonia 09/06/2013   COPD (chronic obstructive pulmonary disease) ([place])    Coronary atherosclerosis of native coronary artery    a. 02/2003 s/p CABG x 2 (VG->RI, VG->RPDA; b. 11/2019 PCI: LM nl, LAD 90d, D3 50, RI 100, LCX 100p, OM3 100 - fills via L->L collats from D2/dLAD, RCA 100p, VG->RPDA ok, VG->RI 95 (3.5x48 Synergy XD DES).   Drug abuse ([place])    hx; tested for cocaine as recently as 2/08. says he is not using drugs now - avoided defib. for this reason    ESRD (end stage renal disease) ([place])    Hemo M-W-F- [facility]   Fall at home 10/2020   GERD (gastroesophageal reflux disease)    takes OTC meds as needed   GI bleeding    a. 11/2019 EGD: angiodysplastic lesions w/ bleeding s/p argon plasma/clipping/epi inj. Multiple admissions for the same.   Glaucoma    uses eye drops daily   Hepatitis B 1968   "tx'd w/isolation; caught it from toilet stools in gym"   History of blood transfusion  03/01/2019   History of colon polyps    benign   History of gout    takes Allopurinol daily as well as Colchicine-if needed (02/28/2018)   History of kidney stones    HTN (hypertension)    takes Coreg,Imdur.and Apresoline daily   Human immunodeficiency virus (HIV) disease ([place]) dx'd 1995   on [place] as of 12/2020.     Hyperlipidemia    Ischemic cardiomyopathy    a. 01/2019 Echo: EF 40-45%, diffuse HK, mild basal septal hypertrophy. Diast dysfxn. Nl RV size/fxn. Sev dil LA. Triv MR/TR/PR.   Muscle spasm    takes Zanaflex as needed   Myocardial infarction ([place]) ~ 2004/2005   Nocturia    Peripheral neuropathy    takes gabapentin daily   Pneumonia    "at least twice" (02/28/2018)   SDH (subdural hematoma) (HCC)    Syphilis, unspecified    Type II diabetes mellitus ([place]) 2004   Lantus daily.Average fasting blood sugar 125-199   Wears glasses    Wears partial dentures     Allergies  Allergen Reactions   Augmentin [Amoxicillin-Pot Clavulanate] Diarrhea and Other (See Comments)    Severe diarrhea   Morphine Sulfate Anaphylaxis  Can't mix with oxycodone    Mucinex Fast-Max Other (See Comments)    Intense sweating    Amphetamines Other (See Comments)    Unknown reaction    ROS: Negative except as per HPI above  Objective:  General: AAO x3, NAD  Dermatological: On the right foot at prior surgical site there is healed dorsal incision with no evidence of ulceration drainage erythema or edema.  Plantar to the prior fourth metatarsal head there is hyperkeratotic tissue with no open wound present no drainage.  On the left foot there is noted to be peeling dry and rash of the skin consistent with tinea pedis infection of the dorsal forefoot.  Vascular:  Dorsalis Pedis artery and Posterior Tibial artery pedal pulses are 2/4 bilateral.  Capillary fill time < 3 sec to all digits.   Neruologic: Grossly intact via light touch bilateral. Protective threshold intact to all sites bilateral.    Musculoskeletal: No gross boney pedal deformities bilateral. No pain, crepitus, or limitation noted with foot and ankle range of motion bilateral. Muscular strength 5/5 in all groups tested bilateral.  Gait: Unassisted, Nonantalgic.   No images are attached to the encounter.  Radiographs:  Date: 12/01/2022 XR the left foot Weightbearing AP/Lateral/Oblique   Findings: Attention directed the first metatarsal head there is no to be significant osteoarthritic degenerative changes cyst formation but no evidence of osteolysis or erosion to indicate infection.  Hallux abductovalgus deformity is present.  No fractures identified. Assessment:   1. Tinea pedis of left foot   2. Pain due to onychomycosis of toenails of both feet   3. Ulcer of right foot, with necrosis of bone ([place])      Plan:  Patient was evaluated and treated and all questions answered.  # Tinea pedis of the left dorsal forefoot Discussed the etiology and treatment options for tinea pedis.  Discussed topical and oral treatment.  Recommended topical treatment with 2% ketoconazole cream.  This was sent to the patient's pharmacy.  Also discussed appropriate foot hygiene, use of antifungal spray such as Tinactin in shoes, as well as cleaning her foot surfaces such as showers and bathroom floors with bleach.  # History of ulceration and osteomyelitis in the right fourth metatarsal phalangeal joint.  Status post fourth MPJ resection right foot on July 20, 2022 now fully healed with no evidence of residual infection or recurrence of ulceration dorsal or plantar to the prior fourth metatarsal phalangeal joint -Continue to monitor for any recurrence of ulceration. -Will consider diabetic shoes and inserts at next visit -Mild hyperkeratotic tissue at the plantar aspect of the prior fourth MPJ was lightly debrided at this visit  as a courtesy to the patient -Will consider getting another set of x-rays to ensure no residual  osteomyelitis present  Return in about 1 month (around 12/30/2022) for [facility].          Everitt Amber, DPM [facility]

## 2022-12-02 DIAGNOSIS — Z992 Dependence on renal dialysis: Secondary | ICD-10-CM | POA: Diagnosis not present

## 2022-12-02 DIAGNOSIS — E1122 Type 2 diabetes mellitus with diabetic chronic kidney disease: Secondary | ICD-10-CM | POA: Diagnosis not present

## 2022-12-02 DIAGNOSIS — D631 Anemia in chronic kidney disease: Secondary | ICD-10-CM | POA: Diagnosis not present

## 2022-12-02 DIAGNOSIS — N186 End stage renal disease: Secondary | ICD-10-CM | POA: Diagnosis not present

## 2022-12-02 DIAGNOSIS — M868X8 Other osteomyelitis, other site: Secondary | ICD-10-CM | POA: Diagnosis not present

## 2022-12-02 DIAGNOSIS — N2581 Secondary hyperparathyroidism of renal origin: Secondary | ICD-10-CM | POA: Diagnosis not present

## 2022-12-05 ENCOUNTER — Other Ambulatory Visit: Payer: Self-pay

## 2022-12-05 DIAGNOSIS — M868X8 Other osteomyelitis, other site: Secondary | ICD-10-CM | POA: Diagnosis not present

## 2022-12-05 DIAGNOSIS — N2581 Secondary hyperparathyroidism of renal origin: Secondary | ICD-10-CM | POA: Diagnosis not present

## 2022-12-05 DIAGNOSIS — Z992 Dependence on renal dialysis: Secondary | ICD-10-CM | POA: Diagnosis not present

## 2022-12-05 DIAGNOSIS — D631 Anemia in chronic kidney disease: Secondary | ICD-10-CM | POA: Diagnosis not present

## 2022-12-05 DIAGNOSIS — E1122 Type 2 diabetes mellitus with diabetic chronic kidney disease: Secondary | ICD-10-CM | POA: Diagnosis not present

## 2022-12-05 DIAGNOSIS — N186 End stage renal disease: Secondary | ICD-10-CM | POA: Diagnosis not present

## 2022-12-07 DIAGNOSIS — E1122 Type 2 diabetes mellitus with diabetic chronic kidney disease: Secondary | ICD-10-CM | POA: Diagnosis not present

## 2022-12-07 DIAGNOSIS — N186 End stage renal disease: Secondary | ICD-10-CM | POA: Diagnosis not present

## 2022-12-07 DIAGNOSIS — N2581 Secondary hyperparathyroidism of renal origin: Secondary | ICD-10-CM | POA: Diagnosis not present

## 2022-12-07 DIAGNOSIS — M868X8 Other osteomyelitis, other site: Secondary | ICD-10-CM | POA: Diagnosis not present

## 2022-12-07 DIAGNOSIS — D631 Anemia in chronic kidney disease: Secondary | ICD-10-CM | POA: Diagnosis not present

## 2022-12-07 DIAGNOSIS — Z992 Dependence on renal dialysis: Secondary | ICD-10-CM | POA: Diagnosis not present

## 2022-12-08 ENCOUNTER — Ambulatory Visit (HOSPITAL_COMMUNITY)
Admission: RE | Admit: 2022-12-08 | Discharge: 2022-12-08 | Disposition: A | Discharge status: Home / Self Care | Payer: 59 | Source: Ambulatory Visit | Attending: Gastroenterology | Admitting: Gastroenterology

## 2022-12-08 DIAGNOSIS — Q2730 Arteriovenous malformation, site unspecified: Secondary | ICD-10-CM | POA: Insufficient documentation

## 2022-12-08 MED ORDER — OCTREOTIDE ACETATE 20 MG IM KIT
10.0000 mg | PACK | INTRAMUSCULAR | Status: DC
  Administered 2022-12-08: 10 mg via INTRAMUSCULAR
  Filled 2022-12-08: qty 1

## 2022-12-09 DIAGNOSIS — M868X8 Other osteomyelitis, other site: Secondary | ICD-10-CM | POA: Diagnosis not present

## 2022-12-09 DIAGNOSIS — D631 Anemia in chronic kidney disease: Secondary | ICD-10-CM | POA: Diagnosis not present

## 2022-12-09 DIAGNOSIS — N2581 Secondary hyperparathyroidism of renal origin: Secondary | ICD-10-CM | POA: Diagnosis not present

## 2022-12-09 DIAGNOSIS — E1122 Type 2 diabetes mellitus with diabetic chronic kidney disease: Secondary | ICD-10-CM | POA: Diagnosis not present

## 2022-12-09 DIAGNOSIS — N186 End stage renal disease: Secondary | ICD-10-CM | POA: Diagnosis not present

## 2022-12-09 DIAGNOSIS — Z992 Dependence on renal dialysis: Secondary | ICD-10-CM | POA: Diagnosis not present

## 2022-12-12 DIAGNOSIS — D631 Anemia in chronic kidney disease: Secondary | ICD-10-CM | POA: Diagnosis not present

## 2022-12-12 DIAGNOSIS — M868X8 Other osteomyelitis, other site: Secondary | ICD-10-CM | POA: Diagnosis not present

## 2022-12-12 DIAGNOSIS — N2581 Secondary hyperparathyroidism of renal origin: Secondary | ICD-10-CM | POA: Diagnosis not present

## 2022-12-12 DIAGNOSIS — E1122 Type 2 diabetes mellitus with diabetic chronic kidney disease: Secondary | ICD-10-CM | POA: Diagnosis not present

## 2022-12-12 DIAGNOSIS — Z992 Dependence on renal dialysis: Secondary | ICD-10-CM | POA: Diagnosis not present

## 2022-12-12 DIAGNOSIS — N186 End stage renal disease: Secondary | ICD-10-CM | POA: Diagnosis not present

## 2022-12-14 DIAGNOSIS — E1122 Type 2 diabetes mellitus with diabetic chronic kidney disease: Secondary | ICD-10-CM | POA: Diagnosis not present

## 2022-12-14 DIAGNOSIS — Z992 Dependence on renal dialysis: Secondary | ICD-10-CM | POA: Diagnosis not present

## 2022-12-14 DIAGNOSIS — M868X8 Other osteomyelitis, other site: Secondary | ICD-10-CM | POA: Diagnosis not present

## 2022-12-14 DIAGNOSIS — N186 End stage renal disease: Secondary | ICD-10-CM | POA: Diagnosis not present

## 2022-12-14 DIAGNOSIS — D631 Anemia in chronic kidney disease: Secondary | ICD-10-CM | POA: Diagnosis not present

## 2022-12-14 DIAGNOSIS — N2581 Secondary hyperparathyroidism of renal origin: Secondary | ICD-10-CM | POA: Diagnosis not present

## 2022-12-15 ENCOUNTER — Ambulatory Visit (INDEPENDENT_AMBULATORY_CARE_PROVIDER_SITE_OTHER): Payer: 59 | Admitting: Physician Assistant

## 2022-12-15 VITALS — BP 117/69 | HR 71 | Temp 98.0°F | Resp 20 | Ht 69.0 in | Wt 185.6 lb

## 2022-12-15 DIAGNOSIS — M79602 Pain in left arm: Secondary | ICD-10-CM | POA: Diagnosis not present

## 2022-12-15 NOTE — Progress Notes (Signed)
Established Dialysis Access   History of Present Illness   Jacob [name] is a 72 y.o. ([birth year]) male who presents for evaluation of left arm pain.  He has a history of ligation of left upper arm AV fistula by Dr. Scot Dock on 02/08/2022.  This was ligated due to nonhealing left upper extremity wounds with steal symptoms.  Since then the patient's wounds have fully healed.  Postoperatively, the patient's steal symptoms significantly decreased and he only continued to have intermittent numbness/tingling of his fingertips.  He experiences the symptoms in his right hand as well, likely due to diabetic neuropathy.  On 04/12/2022 the patient underwent right upper extremity AV graft creation by Dr. Scot Dock.  He currently dialyzes through this graft without issue.  He was referred to our office with concerns for left arm pain.  He states over the past couple of months he has had intermittent shooting pains down his left arm, beginning at his shoulder and ending at his fingertips.  He had 1 episode of acutely worsening symptoms this past Sunday.  He states that he had shooting pain beginning from the left side of his neck, down and across to the shoulder, and extending all the way to his fingertips.  He states that the left side of his trapezius muscle also felt sore and was tender to touch.  These pains drastically worsened if he lifted his arm up and stretched out in front of him or stretched it out to the left.  He also endorses reduced ability to pick things up/grip items with his fingertips on the left for the past couple of months.  He denies any new wounds on his left hand or excessive coldness.  He denies experiencing any of the symptoms in his right upper extremity.  Current Outpatient Medications  Medication Sig Dispense Refill   acetaminophen (TYLENOL) 650 MG CR tablet Take 1,300 mg by mouth 3 (three) times daily.     allopurinol (ZYLOPRIM) 100 MG tablet Take one tablet by mouth once daily. 90  tablet 2   atorvastatin (LIPITOR) 10 MG tablet TAKE 1 TABLET(10 MG) BY MOUTH DAILY 90 tablet 2   B-D UF III MINI PEN NEEDLES 31G X 5 MM MISC USE 4 TIMES DAILY 100 each 11   BIKTARVY 50-200-25 MG TABS tablet Take 1 tablet by mouth daily. 30 tablet 11   Calcium Acetate 667 MG TABS Take 667-2,001 mg by mouth See admin instructions. Take 2001 mg by mouth three times a day with meals and 667 mg with each snack     carvedilol (COREG) 3.125 MG tablet Take 1 tablet (3.125 mg total) by mouth 2 (two) times daily with a meal. 180 tablet 3   diclofenac Sodium (VOLTAREN) 1 % GEL Apply 2 grams topically to the affected area three times daily as needed for pain. 0000000 g 1   folic acid (FOLVITE) 1 MG tablet TAKE 1 TABLET(1 MG) BY MOUTH DAILY 90 tablet 2   gabapentin (NEURONTIN) 300 MG capsule TAKE 1 CAPSULE BY MOUTH DAILY. 90 capsule 2   glucose blood (ONETOUCH ULTRA) test strip Check blood sugar three times daily E11.40 300 strip 3   insulin lispro (HUMALOG) 100 UNIT/ML KwikPen INJECT 20 UNITS UNDER THE SKIN THREE TIMES DAILY AS NEEDED FOR HIGH BLOOD SUGAR(ABOVE 150) (Patient taking differently: Inject 15 Units into the skin 3 (three) times daily. INJECT 15 UNITS UNDER THE SKIN THREE TIMES DAILY AS NEEDED FOR HIGH BLOOD SUGAR(ABOVE 150)) 45 mL 1  isosorbide mononitrate (IMDUR) 30 MG 24 hr tablet Take 1 tablet (30 mg total) by mouth daily. 90 tablet 2   ketoconazole (NIZORAL) 2 % cream Apply 1 Application topically daily. 15 g 0   Lancets (ONETOUCH DELICA PLUS 123XX123) MISC USE 3 TIMES DAILY 300 each 3   latanoprost (XALATAN) 0.005 % ophthalmic solution Place 1 drop into both eyes at bedtime.     lidocaine-prilocaine (EMLA) cream Apply 1 application topically every Monday, Wednesday, and Friday with hemodialysis.     Menthol-Camphor (TIGER BALM ARTHRITIS RUB) 11-11 % CREA Apply 1 Application topically daily as needed (pain).     multivitamin (RENA-VIT) TABS tablet Take 1 tablet by mouth daily.     nitroGLYCERIN  (NITROSTAT) 0.3 MG SL tablet PLACE 1 TABLET UNDER THE TONGUE AS NEEDED FOR CHEST PAIN 75 tablet 1   octreotide (SANDOSTATIN LAR DEPOT) 10 MG injection Inject 10 mg into the muscle every 28 days. 1 kit 11   ondansetron (ZOFRAN) 4 MG tablet TAKE 1 TABLET(4 MG) BY MOUTH EVERY 8 HOURS AS NEEDED FOR NAUSEA OR VOMITING (Patient taking differently: Take 4 mg by mouth every 8 (eight) hours as needed for vomiting or nausea.) 20 tablet 1   oxyCODONE-acetaminophen (PERCOCET) 5-325 MG tablet Take 1 tablet by mouth every 6 (six) hours as needed for severe pain. 120 tablet 0   timolol (TIMOPTIC) 0.5 % ophthalmic solution Place 1 drop into both eyes in the morning.     umeclidinium-vilanterol (ANORO ELLIPTA) 62.5-25 MCG/ACT AEPB INHALE 1 PUFF BY MOUTH EVERY DAY (Patient taking differently: Inhale 1 puff into the lungs daily.) 60 each 11   No current facility-administered medications for this visit.    REVIEW OF SYSTEMS (negative unless checked):   Cardiac:  []$  Chest pain or chest pressure? []$  Shortness of breath upon activity? []$  Shortness of breath when lying flat? []$  Irregular heart rhythm?  Vascular:  []$  Pain in calf, thigh, or hip brought on by walking? []$  Pain in feet at night that wakes you up from your sleep? []$  Blood clot in your veins? []$  Leg swelling?  Pulmonary:  []$  Oxygen at home? []$  Productive cough? []$  Wheezing?  Neurologic:  []$  Sudden weakness in arms or legs? []$  Sudden numbness in arms or legs? []$  Sudden onset of difficult speaking or slurred speech? []$  Temporary loss of vision in one eye? []$  Problems with dizziness?  Gastrointestinal:  []$  Blood in stool? []$  Vomited blood?  Genitourinary:  []$  Burning when urinating? []$  Blood in urine?  Psychiatric:  []$  Major depression  Hematologic:  []$  Bleeding problems? []$  Problems with blood clotting?  Dermatologic:  []$  Rashes or ulcers?  Constitutional:  []$  Fever or chills?  Ear/Nose/Throat:  []$  Change in hearing? []$   Nose bleeds? []$  Sore throat?  Musculoskeletal:  []$  Back pain? []$  Joint pain? []$  Muscle pain?   Physical Examination   Vitals:   12/15/22 1008  BP: 117/69  Pulse: 71  Resp: 20  Temp: 98 F (36.7 C)  TempSrc: Temporal  SpO2: 97%  Weight: 185 lb 9.6 oz (84.2 kg)  Height: 5' 9"$  (1.753 m)   Body mass index is 27.41 kg/m.  General:  WDWN in NAD; vital signs documented above Gait: Not observed HENT: WNL, normocephalic Pulmonary: normal non-labored breathing  Cardiac: RRR, no carotid bruit Abdomen: soft, NT, no masses Skin: without rashes Extremities: No detected weakness or numbness in left upper extremity.  Palpable left radial pulse and left brachial pulse Musculoskeletal: no muscle wasting or atrophy  Neurologic: A&O X 3;  No focal weakness or paresthesias are detected Psychiatric:  The pt has Normal affect.   Medical Decision Making   Jacob [name] is a 72 y.o. male who presents for evaluation of left arm pain  The patient has a history of left upper extremity fistula, which was ligated on 02/08/2022 due to left hand wounds with steal symptoms.  Postoperatively, the patient's wounds healed and his steal symptoms resolved.  He continued to have bilateral, intermittent numbness/tingling in his fingertips due to his diabetic neuropathy.  He now dialyzes via right upper extremity AV graft He has been experiencing a couple of months of intermittent left arm pain and reduced grip strength in the left hand.  He endorses 1 episode of arm pain that extended from the left side of his neck all the way down his arm.  This pain was worsened when his arm was in full extension out in front of him or at his side He has palpable left radial and brachial pulses.  He no longer has any steal symptoms in his left upper extremity s/p ligation of his fistula in 2023 Based on the patient's description of his pain, I do not believe this is arterial in nature.  He has palpable pulses in the left  upper extremity, and he describes his pain as shooting typically originating in his shoulder.  I have told the patient this may be related to nerve compression issues in his neck.  I have suggested that he follow-up with his orthopedic doctor if his symptoms worsen He can follow-up with our office as needed   Vicente Serene PA-C [facility] Office: [facility] MD: Scot Dock

## 2022-12-16 DIAGNOSIS — Z992 Dependence on renal dialysis: Secondary | ICD-10-CM | POA: Diagnosis not present

## 2022-12-16 DIAGNOSIS — N2581 Secondary hyperparathyroidism of renal origin: Secondary | ICD-10-CM | POA: Diagnosis not present

## 2022-12-16 DIAGNOSIS — N186 End stage renal disease: Secondary | ICD-10-CM | POA: Diagnosis not present

## 2022-12-16 DIAGNOSIS — M868X8 Other osteomyelitis, other site: Secondary | ICD-10-CM | POA: Diagnosis not present

## 2022-12-16 DIAGNOSIS — E1122 Type 2 diabetes mellitus with diabetic chronic kidney disease: Secondary | ICD-10-CM | POA: Diagnosis not present

## 2022-12-16 DIAGNOSIS — D631 Anemia in chronic kidney disease: Secondary | ICD-10-CM | POA: Diagnosis not present

## 2022-12-19 ENCOUNTER — Other Ambulatory Visit: Payer: Self-pay | Admitting: *Deleted

## 2022-12-19 DIAGNOSIS — E1122 Type 2 diabetes mellitus with diabetic chronic kidney disease: Secondary | ICD-10-CM | POA: Diagnosis not present

## 2022-12-19 DIAGNOSIS — N186 End stage renal disease: Secondary | ICD-10-CM | POA: Diagnosis not present

## 2022-12-19 DIAGNOSIS — M868X8 Other osteomyelitis, other site: Secondary | ICD-10-CM | POA: Diagnosis not present

## 2022-12-19 DIAGNOSIS — Z992 Dependence on renal dialysis: Secondary | ICD-10-CM | POA: Diagnosis not present

## 2022-12-19 DIAGNOSIS — N2581 Secondary hyperparathyroidism of renal origin: Secondary | ICD-10-CM | POA: Diagnosis not present

## 2022-12-19 DIAGNOSIS — D631 Anemia in chronic kidney disease: Secondary | ICD-10-CM | POA: Diagnosis not present

## 2022-12-19 MED ORDER — DICLOFENAC SODIUM 1 % EX GEL: CUTANEOUS | 1 refills | Status: DC

## 2022-12-19 NOTE — Telephone Encounter (Signed)
[facility] requested refill.

## 2022-12-21 DIAGNOSIS — D631 Anemia in chronic kidney disease: Secondary | ICD-10-CM | POA: Diagnosis not present

## 2022-12-21 DIAGNOSIS — N186 End stage renal disease: Secondary | ICD-10-CM | POA: Diagnosis not present

## 2022-12-21 DIAGNOSIS — E1122 Type 2 diabetes mellitus with diabetic chronic kidney disease: Secondary | ICD-10-CM | POA: Diagnosis not present

## 2022-12-21 DIAGNOSIS — M868X8 Other osteomyelitis, other site: Secondary | ICD-10-CM | POA: Diagnosis not present

## 2022-12-21 DIAGNOSIS — N2581 Secondary hyperparathyroidism of renal origin: Secondary | ICD-10-CM | POA: Diagnosis not present

## 2022-12-21 DIAGNOSIS — Z992 Dependence on renal dialysis: Secondary | ICD-10-CM | POA: Diagnosis not present

## 2022-12-22 DIAGNOSIS — N186 End stage renal disease: Secondary | ICD-10-CM | POA: Diagnosis not present

## 2022-12-22 DIAGNOSIS — Z992 Dependence on renal dialysis: Secondary | ICD-10-CM | POA: Diagnosis not present

## 2022-12-22 DIAGNOSIS — I129 Hypertensive chronic kidney disease with stage 1 through stage 4 chronic kidney disease, or unspecified chronic kidney disease: Secondary | ICD-10-CM | POA: Diagnosis not present

## 2022-12-23 DIAGNOSIS — N2581 Secondary hyperparathyroidism of renal origin: Secondary | ICD-10-CM | POA: Diagnosis not present

## 2022-12-23 DIAGNOSIS — D631 Anemia in chronic kidney disease: Secondary | ICD-10-CM | POA: Diagnosis not present

## 2022-12-23 DIAGNOSIS — M868X8 Other osteomyelitis, other site: Secondary | ICD-10-CM | POA: Diagnosis not present

## 2022-12-23 DIAGNOSIS — Z992 Dependence on renal dialysis: Secondary | ICD-10-CM | POA: Diagnosis not present

## 2022-12-23 DIAGNOSIS — N186 End stage renal disease: Secondary | ICD-10-CM | POA: Diagnosis not present

## 2022-12-26 DIAGNOSIS — N2581 Secondary hyperparathyroidism of renal origin: Secondary | ICD-10-CM | POA: Diagnosis not present

## 2022-12-26 DIAGNOSIS — M868X8 Other osteomyelitis, other site: Secondary | ICD-10-CM | POA: Diagnosis not present

## 2022-12-26 DIAGNOSIS — Z992 Dependence on renal dialysis: Secondary | ICD-10-CM | POA: Diagnosis not present

## 2022-12-26 DIAGNOSIS — N186 End stage renal disease: Secondary | ICD-10-CM | POA: Diagnosis not present

## 2022-12-26 DIAGNOSIS — D631 Anemia in chronic kidney disease: Secondary | ICD-10-CM | POA: Diagnosis not present

## 2022-12-27 ENCOUNTER — Ambulatory Visit (INDEPENDENT_AMBULATORY_CARE_PROVIDER_SITE_OTHER): Payer: 59 | Admitting: Podiatry

## 2022-12-27 ENCOUNTER — Other Ambulatory Visit: Payer: Self-pay

## 2022-12-27 DIAGNOSIS — L97514 Non-pressure chronic ulcer of other part of right foot with necrosis of bone: Secondary | ICD-10-CM

## 2022-12-27 DIAGNOSIS — E1142 Type 2 diabetes mellitus with diabetic polyneuropathy: Secondary | ICD-10-CM

## 2022-12-27 NOTE — Telephone Encounter (Signed)
Patient called requesting refill of oxycodone stating that he only has 10 pills left. Medication was last filled 11/18/2022. Treatment agreement is up to date. Medication has been pended.  Patient also states to be on the lookout for paperwork from [place] and [place]. He says that it will be the same from last year. You just need to fill it out and fax back.  Message routed to Alain Honey, MD

## 2022-12-28 ENCOUNTER — Telehealth: Payer: Self-pay | Admitting: Podiatry

## 2022-12-28 DIAGNOSIS — Z992 Dependence on renal dialysis: Secondary | ICD-10-CM | POA: Diagnosis not present

## 2022-12-28 DIAGNOSIS — M868X8 Other osteomyelitis, other site: Secondary | ICD-10-CM | POA: Diagnosis not present

## 2022-12-28 DIAGNOSIS — N186 End stage renal disease: Secondary | ICD-10-CM | POA: Diagnosis not present

## 2022-12-28 DIAGNOSIS — D631 Anemia in chronic kidney disease: Secondary | ICD-10-CM | POA: Diagnosis not present

## 2022-12-28 DIAGNOSIS — N2581 Secondary hyperparathyroidism of renal origin: Secondary | ICD-10-CM | POA: Diagnosis not present

## 2022-12-28 MED ORDER — OXYCODONE-ACETAMINOPHEN 5-325 MG PO TABS: 1.0000 | ORAL_TABLET | Freq: Four times a day (QID) | ORAL | 0 refills | Status: DC | PRN

## 2022-12-28 NOTE — Telephone Encounter (Signed)
Predetermination submitted through website [facility] portal - Case #

## 2022-12-28 NOTE — Telephone Encounter (Signed)
Rerouted to Dr. Sabra Heck due to Print copy.

## 2022-12-28 NOTE — Addendum Note (Signed)
Addended by: Rafael Bihari A on: 12/28/2022 10:08 AM   Modules accepted: Orders

## 2022-12-28 NOTE — Addendum Note (Signed)
Addended by: Rafael Bihari A on: 12/28/2022 10:07 AM   Modules accepted: Orders

## 2022-12-29 ENCOUNTER — Telehealth: Payer: Self-pay

## 2022-12-29 ENCOUNTER — Telehealth: Payer: Self-pay | Admitting: Cardiovascular Disease

## 2022-12-29 ENCOUNTER — Ambulatory Visit (INDEPENDENT_AMBULATORY_CARE_PROVIDER_SITE_OTHER): Payer: 59

## 2022-12-29 ENCOUNTER — Other Ambulatory Visit: Payer: Self-pay

## 2022-12-29 ENCOUNTER — Ambulatory Visit (INDEPENDENT_AMBULATORY_CARE_PROVIDER_SITE_OTHER): Payer: 59 | Admitting: Podiatry

## 2022-12-29 ENCOUNTER — Other Ambulatory Visit (HOSPITAL_COMMUNITY): Payer: Self-pay

## 2022-12-29 DIAGNOSIS — M2011 Hallux valgus (acquired), right foot: Secondary | ICD-10-CM

## 2022-12-29 DIAGNOSIS — M79675 Pain in left toe(s): Secondary | ICD-10-CM | POA: Diagnosis not present

## 2022-12-29 DIAGNOSIS — M2012 Hallux valgus (acquired), left foot: Secondary | ICD-10-CM

## 2022-12-29 DIAGNOSIS — B351 Tinea unguium: Secondary | ICD-10-CM | POA: Diagnosis not present

## 2022-12-29 DIAGNOSIS — M79674 Pain in right toe(s): Secondary | ICD-10-CM

## 2022-12-29 DIAGNOSIS — M86471 Chronic osteomyelitis with draining sinus, right ankle and foot: Secondary | ICD-10-CM

## 2022-12-29 DIAGNOSIS — E1142 Type 2 diabetes mellitus with diabetic polyneuropathy: Secondary | ICD-10-CM

## 2022-12-29 DIAGNOSIS — B353 Tinea pedis: Secondary | ICD-10-CM

## 2022-12-29 DIAGNOSIS — L97514 Non-pressure chronic ulcer of other part of right foot with necrosis of bone: Secondary | ICD-10-CM

## 2022-12-29 NOTE — Telephone Encounter (Signed)
Patient called just to inform Dr. Alain Honey that the dialysis doctor took him off the isorsobide 30 mg because that is what is causing hime to have cramps.   Message routed to Dr. Lestine Box

## 2022-12-29 NOTE — Telephone Encounter (Signed)
Pt c/o medication issue:  1. Name of Medication: carvedilol (COREG) 3.125 MG tablet   2. How are you currently taking this medication (dosage and times per day)? Not currently taking  3. Are you having a reaction (difficulty breathing--STAT)? No   4. What is your medication issue? States dialysis doctor took him off this medication due to believing it's the cause for his cramping.

## 2022-12-29 NOTE — Progress Notes (Signed)
Subjective:  [name], male    DOB: 26-Dec-1950,  MRN: [id number]  Chief Complaint  Patient presents with   Nail Problem    Routine foot care     72 y.o. male presents for follow-up of rash on his left foot as well as coming in to have his nails trimmed.  Also following up approximately 6 months out from surgery on his right foot for resection of infected bone at the fourth MPJ.  He reports that he is doing very well at this visit.  He has no issues with the left dorsal foot as he did at last appointment after using the ketoconazole 2% cream that was prescribed to him.  He also reports the right foot is doing well no active issues with wounds on the right foot they have closed he does have a wound on the right ankle that is being managed by wound care center.  He does have some inability to trim his nails due to thickness dystrophy and they are painful.  Past Medical History:  Diagnosis Date   Acute respiratory failure ([place]) 03/01/2018   Anemia    Arthritis    "all over; mostly knees and back" (02/28/2018)   Chronic combined systolic and diastolic CHF (congestive heart failure) (HCC)    Chronic lower back pain    stenosis   Community acquired pneumonia 09/06/2013   COPD (chronic obstructive pulmonary disease) ([place])    Coronary atherosclerosis of native coronary artery    a. 02/2003 s/p CABG x 2 (VG->RI, VG->RPDA; b. 11/2019 PCI: LM nl, LAD 90d, D3 50, RI 100, LCX 100p, OM3 100 - fills via L->L collats from D2/dLAD, RCA 100p, VG->RPDA ok, VG->RI 95 (3.5x48 Synergy XD DES).   Drug abuse ([place])    hx; tested for cocaine as recently as 2/08. says he is not using drugs now - avoided defib. for this reason    ESRD (end stage renal disease) ([employer])    Hemo M-W-F- [facility]   Fall at home 10/2020   GERD (gastroesophageal reflux disease)    takes OTC meds as needed   GI bleeding    a. 11/2019 EGD: angiodysplastic lesions w/ bleeding s/p argon plasma/clipping/epi inj. Multiple admissions  for the same.   Glaucoma    uses eye drops daily   Hepatitis B 1968   "tx'd w/isolation; caught it from toilet stools in gym"   History of blood transfusion 03/01/2019   History of colon polyps    benign   History of gout    takes Allopurinol daily as well as Colchicine-if needed (02/28/2018)   History of kidney stones    HTN (hypertension)    takes Coreg,Imdur.and Apresoline daily   Human immunodeficiency virus (HIV) disease ([place]) dx'd 1995   on [place] as of 12/2020.     Hyperlipidemia    Ischemic cardiomyopathy    a. 01/2019 Echo: EF 40-45%, diffuse HK, mild basal septal hypertrophy. Diast dysfxn. Nl RV size/fxn. Sev dil LA. Triv MR/TR/PR.   Muscle spasm    takes Zanaflex as needed   Myocardial infarction ([place]) ~ 2004/2005   Nocturia    Peripheral neuropathy    takes gabapentin daily   Pneumonia    "at least twice" (02/28/2018)   SDH (subdural hematoma) (HCC)    Syphilis, unspecified    Type II diabetes mellitus ([place]) 2004   Lantus daily.Average fasting blood sugar 125-199   Wears glasses    Wears partial dentures  Allergies  Allergen Reactions   Augmentin [Amoxicillin-Pot Clavulanate] Diarrhea and Other (See Comments)    Severe diarrhea   Morphine Sulfate Anaphylaxis    Can't mix with oxycodone    Mucinex Fast-Max Other (See Comments)    Intense sweating    Amphetamines Other (See Comments)    Unknown reaction    ROS: Negative except as per HPI above  Objective:  General: AAO x3, NAD  Dermatological: On the right foot at prior surgical site there is healed dorsal incision with no evidence of ulceration drainage erythema or edema.  Plantar to the prior fourth metatarsal head there is minimal hyperkeratotic tissue with no open wound present no drainage.  See pictures of right foot below on the left foot at site of prior dry peeling rash on the dorsal forefoot this is now resolved with normal skin present no rash or wound.      Vascular:  Dorsalis Pedis artery  and Posterior Tibial artery pedal pulses are palpable bilateral.  Capillary fill time < 3 sec to all digits.   Neruologic: Grossly diminished via light touch bilateral. Protective threshold diminished bilaterally  Musculoskeletal: Status post right fourth metatarsal head resection  Gait: Unassisted, Nonantalgic.   No images are attached to the encounter.  Radiographs:  Date: 12/29/2022 XR the right foot Weightbearing AP/Lateral/Oblique   Findings: Attention directed the fourth metatarsal phalangeal joint there is noted to be removal of the 4 metatarsal head.  There is still some osseous irregularity at the lateral aspect of the prior joint consistent with osseous fragments.  There is no significant concern for residual infection at the fourth metatarsal shaft or the fourth proximal phalanx both of which would have been resected.  No soft tissue emphysema noted. Assessment:   1. Pain due to onychomycosis of toenails of both feet   2. Tinea pedis of left foot   3. Ulcer of right foot, with necrosis of bone ([place])   4. Chronic osteomyelitis of right foot with draining sinus (HCC)   5. DM type 2 with diabetic peripheral neuropathy (HCC)   6. Hallux valgus, acquired, bilateral       Plan:  Patient was evaluated and treated and all questions answered.  # Tinea pedis of the left dorsal forefoot -Resolved after ketoconazole use additional PRN. Discussed the etiology and treatment options for tinea pedis.  Discussed topical and oral treatment.  Recommended topical treatment with 2% ketoconazole cream.  This was sent to the patient's pharmacy.  Also discussed appropriate foot hygiene, use of antifungal spray such as Tinactin in shoes, as well as cleaning her foot surfaces such as showers and bathroom floors with bleach.  # History of ulceration and osteomyelitis in the right fourth metatarsal phalangeal joint.  Status post fourth MPJ resection right foot on July 20, 2022 now fully healed  with no evidence of residual infection or recurrence of ulceration dorsal or plantar to the prior fourth metatarsal phalangeal joint -Continue to monitor for any recurrence of ulceration. -X-rays as above no acute complication.  Clinically as the wounds are healed and there is no issue at the site recommend continued monitoring -Patient is very happy with the outcome as he still has his fourth toe it is all healed and there is no draining ulcer at this time.  #Onychomycosis with pain  -Nails palliatively debrided as below. -Educated on self-care  Procedure: Nail Debridement Rationale: Pain Type of Debridement: manual, sharp debridement. Instrumentation: Nail nipper, rotary burr. Number of Nails: 10  Return  in about 3 months (around 03/31/2023) for [facility].          Everitt Amber, DPM [facility]

## 2022-12-29 NOTE — Telephone Encounter (Signed)
Left message for patient to call back  

## 2022-12-29 NOTE — Telephone Encounter (Signed)
Called patient back about his message. Patient stated his dialysis doctor took him off of coreg and imdur due to his SBP dropping when he is  at dialysis and body aches. Patient stated he wants Dr. [name] to know that these 2 medications have been stopped. Patient stopped taking them yesterday and has not noted any difference in body aches. Patient does not check his BP at home, so he is not sure if his BP is low on days he is not doing dialysis. Will send to Dr. [name] for advisement.

## 2022-12-29 NOTE — Telephone Encounter (Signed)
Patient is returning call.  °

## 2022-12-30 DIAGNOSIS — Z992 Dependence on renal dialysis: Secondary | ICD-10-CM | POA: Diagnosis not present

## 2022-12-30 DIAGNOSIS — D631 Anemia in chronic kidney disease: Secondary | ICD-10-CM | POA: Diagnosis not present

## 2022-12-30 DIAGNOSIS — N2581 Secondary hyperparathyroidism of renal origin: Secondary | ICD-10-CM | POA: Diagnosis not present

## 2022-12-30 DIAGNOSIS — N186 End stage renal disease: Secondary | ICD-10-CM | POA: Diagnosis not present

## 2022-12-30 DIAGNOSIS — M868X8 Other osteomyelitis, other site: Secondary | ICD-10-CM | POA: Diagnosis not present

## 2023-01-02 DIAGNOSIS — N2581 Secondary hyperparathyroidism of renal origin: Secondary | ICD-10-CM | POA: Diagnosis not present

## 2023-01-02 DIAGNOSIS — N186 End stage renal disease: Secondary | ICD-10-CM | POA: Diagnosis not present

## 2023-01-02 DIAGNOSIS — M868X8 Other osteomyelitis, other site: Secondary | ICD-10-CM | POA: Diagnosis not present

## 2023-01-02 DIAGNOSIS — D631 Anemia in chronic kidney disease: Secondary | ICD-10-CM | POA: Diagnosis not present

## 2023-01-02 DIAGNOSIS — Z992 Dependence on renal dialysis: Secondary | ICD-10-CM | POA: Diagnosis not present

## 2023-01-02 NOTE — Telephone Encounter (Signed)
  Per Dr. [name], Can take on non dialysis days Neither med should be causing body aches.    Called patient with Dr. [name] advisement. Patient agreed to plan and will call back with any other questions.

## 2023-01-04 ENCOUNTER — Other Ambulatory Visit: Payer: Self-pay

## 2023-01-04 DIAGNOSIS — Z992 Dependence on renal dialysis: Secondary | ICD-10-CM | POA: Diagnosis not present

## 2023-01-04 DIAGNOSIS — M868X8 Other osteomyelitis, other site: Secondary | ICD-10-CM | POA: Diagnosis not present

## 2023-01-04 DIAGNOSIS — D631 Anemia in chronic kidney disease: Secondary | ICD-10-CM | POA: Diagnosis not present

## 2023-01-04 DIAGNOSIS — N2581 Secondary hyperparathyroidism of renal origin: Secondary | ICD-10-CM | POA: Diagnosis not present

## 2023-01-04 DIAGNOSIS — N186 End stage renal disease: Secondary | ICD-10-CM | POA: Diagnosis not present

## 2023-01-05 ENCOUNTER — Other Ambulatory Visit (HOSPITAL_COMMUNITY): Payer: Self-pay

## 2023-01-05 ENCOUNTER — Ambulatory Visit (HOSPITAL_COMMUNITY)
Admission: RE | Admit: 2023-01-05 | Discharge: 2023-01-05 | Disposition: A | Discharge status: Home / Self Care | Payer: 59 | Source: Ambulatory Visit | Attending: Gastroenterology | Admitting: Gastroenterology

## 2023-01-05 ENCOUNTER — Other Ambulatory Visit: Payer: Self-pay

## 2023-01-05 DIAGNOSIS — Q2730 Arteriovenous malformation, site unspecified: Secondary | ICD-10-CM | POA: Diagnosis not present

## 2023-01-05 MED ORDER — OCTREOTIDE ACETATE 20 MG IM KIT
10.0000 mg | PACK | INTRAMUSCULAR | Status: DC
  Administered 2023-01-05: 10 mg via INTRAMUSCULAR
  Filled 2023-01-05: qty 1

## 2023-01-05 NOTE — Telephone Encounter (Signed)
Authorization  [id number] 12/28/22-06/26/23   Documentation being sent to scan center

## 2023-01-06 ENCOUNTER — Other Ambulatory Visit (INDEPENDENT_AMBULATORY_CARE_PROVIDER_SITE_OTHER): Payer: 59 | Admitting: Podiatry

## 2023-01-06 ENCOUNTER — Telehealth: Payer: Self-pay | Admitting: Podiatry

## 2023-01-06 DIAGNOSIS — Z992 Dependence on renal dialysis: Secondary | ICD-10-CM | POA: Diagnosis not present

## 2023-01-06 DIAGNOSIS — N186 End stage renal disease: Secondary | ICD-10-CM | POA: Diagnosis not present

## 2023-01-06 DIAGNOSIS — N2581 Secondary hyperparathyroidism of renal origin: Secondary | ICD-10-CM | POA: Diagnosis not present

## 2023-01-06 DIAGNOSIS — D631 Anemia in chronic kidney disease: Secondary | ICD-10-CM | POA: Diagnosis not present

## 2023-01-06 DIAGNOSIS — M868X8 Other osteomyelitis, other site: Secondary | ICD-10-CM | POA: Diagnosis not present

## 2023-01-06 MED ORDER — GENTAMICIN SULFATE 0.1 % EX OINT: 1.0000 | TOPICAL_OINTMENT | Freq: Every day | CUTANEOUS | 0 refills | Status: DC

## 2023-01-06 NOTE — Progress Notes (Signed)
E rx for gentamicin for ankle wound

## 2023-01-06 NOTE — Telephone Encounter (Signed)
Pt wants to know if there is antibiotic ointment or Rx antibiotics that he can take for his ankle. It was a little bothersome on 3/13 & 3/14; I offered him an appt to be seen but he declined. Please advise.

## 2023-01-09 DIAGNOSIS — N186 End stage renal disease: Secondary | ICD-10-CM | POA: Diagnosis not present

## 2023-01-09 DIAGNOSIS — Z992 Dependence on renal dialysis: Secondary | ICD-10-CM | POA: Diagnosis not present

## 2023-01-09 DIAGNOSIS — M868X8 Other osteomyelitis, other site: Secondary | ICD-10-CM | POA: Diagnosis not present

## 2023-01-09 DIAGNOSIS — N2581 Secondary hyperparathyroidism of renal origin: Secondary | ICD-10-CM | POA: Diagnosis not present

## 2023-01-09 DIAGNOSIS — D631 Anemia in chronic kidney disease: Secondary | ICD-10-CM | POA: Diagnosis not present

## 2023-01-11 ENCOUNTER — Telehealth: Payer: Self-pay | Admitting: Podiatry

## 2023-01-11 DIAGNOSIS — Z992 Dependence on renal dialysis: Secondary | ICD-10-CM | POA: Diagnosis not present

## 2023-01-11 DIAGNOSIS — N186 End stage renal disease: Secondary | ICD-10-CM | POA: Diagnosis not present

## 2023-01-11 DIAGNOSIS — D631 Anemia in chronic kidney disease: Secondary | ICD-10-CM | POA: Diagnosis not present

## 2023-01-11 DIAGNOSIS — M868X8 Other osteomyelitis, other site: Secondary | ICD-10-CM | POA: Diagnosis not present

## 2023-01-11 DIAGNOSIS — N2581 Secondary hyperparathyroidism of renal origin: Secondary | ICD-10-CM | POA: Diagnosis not present

## 2023-01-11 NOTE — Telephone Encounter (Signed)
Pt called and stated he called last week and never heard back but has the spot on the ankle and he was not sure what Dr Loel Lofty want him to put on it. But he did get a call from the pharmacy that Dr Loel Lofty had called in gentamicin and he wanted to make sure that is what he wanted him to use.  I checked the chart and seen the note from Dr Loel Lofty that he did send that in for him to use on the ankle. Pt said thank you he will go pick the medication up.

## 2023-01-13 DIAGNOSIS — N186 End stage renal disease: Secondary | ICD-10-CM | POA: Diagnosis not present

## 2023-01-13 DIAGNOSIS — D631 Anemia in chronic kidney disease: Secondary | ICD-10-CM | POA: Diagnosis not present

## 2023-01-13 DIAGNOSIS — Z992 Dependence on renal dialysis: Secondary | ICD-10-CM | POA: Diagnosis not present

## 2023-01-13 DIAGNOSIS — M868X8 Other osteomyelitis, other site: Secondary | ICD-10-CM | POA: Diagnosis not present

## 2023-01-13 DIAGNOSIS — N2581 Secondary hyperparathyroidism of renal origin: Secondary | ICD-10-CM | POA: Diagnosis not present

## 2023-01-16 DIAGNOSIS — M868X8 Other osteomyelitis, other site: Secondary | ICD-10-CM | POA: Diagnosis not present

## 2023-01-16 DIAGNOSIS — N186 End stage renal disease: Secondary | ICD-10-CM | POA: Diagnosis not present

## 2023-01-16 DIAGNOSIS — D631 Anemia in chronic kidney disease: Secondary | ICD-10-CM | POA: Diagnosis not present

## 2023-01-16 DIAGNOSIS — N2581 Secondary hyperparathyroidism of renal origin: Secondary | ICD-10-CM | POA: Diagnosis not present

## 2023-01-16 DIAGNOSIS — Z992 Dependence on renal dialysis: Secondary | ICD-10-CM | POA: Diagnosis not present

## 2023-01-17 ENCOUNTER — Telehealth: Payer: Self-pay

## 2023-01-17 NOTE — Telephone Encounter (Signed)
Patient called to check on his paperwork for his shoes,  paperwork not valid because Dr Sabra Heck has not signed both forms. Faxing to him today to sign and fax back.

## 2023-01-17 NOTE — Telephone Encounter (Signed)
Patient states that he needs his paper work sent in for his new diabetic shoes. Message routed to PCP Sabra Heck Lillette Boxer, MD

## 2023-01-18 DIAGNOSIS — D631 Anemia in chronic kidney disease: Secondary | ICD-10-CM | POA: Diagnosis not present

## 2023-01-18 DIAGNOSIS — M868X8 Other osteomyelitis, other site: Secondary | ICD-10-CM | POA: Diagnosis not present

## 2023-01-18 DIAGNOSIS — Z992 Dependence on renal dialysis: Secondary | ICD-10-CM | POA: Diagnosis not present

## 2023-01-18 DIAGNOSIS — N186 End stage renal disease: Secondary | ICD-10-CM | POA: Diagnosis not present

## 2023-01-18 DIAGNOSIS — N2581 Secondary hyperparathyroidism of renal origin: Secondary | ICD-10-CM | POA: Diagnosis not present

## 2023-01-20 DIAGNOSIS — N2581 Secondary hyperparathyroidism of renal origin: Secondary | ICD-10-CM | POA: Diagnosis not present

## 2023-01-20 DIAGNOSIS — D631 Anemia in chronic kidney disease: Secondary | ICD-10-CM | POA: Diagnosis not present

## 2023-01-20 DIAGNOSIS — N186 End stage renal disease: Secondary | ICD-10-CM | POA: Diagnosis not present

## 2023-01-20 DIAGNOSIS — M868X8 Other osteomyelitis, other site: Secondary | ICD-10-CM | POA: Diagnosis not present

## 2023-01-20 DIAGNOSIS — Z992 Dependence on renal dialysis: Secondary | ICD-10-CM | POA: Diagnosis not present

## 2023-01-22 DIAGNOSIS — N186 End stage renal disease: Secondary | ICD-10-CM | POA: Diagnosis not present

## 2023-01-22 DIAGNOSIS — I129 Hypertensive chronic kidney disease with stage 1 through stage 4 chronic kidney disease, or unspecified chronic kidney disease: Secondary | ICD-10-CM | POA: Diagnosis not present

## 2023-01-22 DIAGNOSIS — Z992 Dependence on renal dialysis: Secondary | ICD-10-CM | POA: Diagnosis not present

## 2023-01-23 DIAGNOSIS — M868X8 Other osteomyelitis, other site: Secondary | ICD-10-CM | POA: Diagnosis not present

## 2023-01-23 DIAGNOSIS — Z992 Dependence on renal dialysis: Secondary | ICD-10-CM | POA: Diagnosis not present

## 2023-01-23 DIAGNOSIS — N186 End stage renal disease: Secondary | ICD-10-CM | POA: Diagnosis not present

## 2023-01-23 DIAGNOSIS — E1122 Type 2 diabetes mellitus with diabetic chronic kidney disease: Secondary | ICD-10-CM | POA: Diagnosis not present

## 2023-01-23 DIAGNOSIS — D631 Anemia in chronic kidney disease: Secondary | ICD-10-CM | POA: Diagnosis not present

## 2023-01-23 DIAGNOSIS — N2581 Secondary hyperparathyroidism of renal origin: Secondary | ICD-10-CM | POA: Diagnosis not present

## 2023-01-24 NOTE — Telephone Encounter (Signed)
Patient has diabetic shoes ordered.

## 2023-01-25 DIAGNOSIS — D631 Anemia in chronic kidney disease: Secondary | ICD-10-CM | POA: Diagnosis not present

## 2023-01-25 DIAGNOSIS — N2581 Secondary hyperparathyroidism of renal origin: Secondary | ICD-10-CM | POA: Diagnosis not present

## 2023-01-25 DIAGNOSIS — N186 End stage renal disease: Secondary | ICD-10-CM | POA: Diagnosis not present

## 2023-01-25 DIAGNOSIS — Z992 Dependence on renal dialysis: Secondary | ICD-10-CM | POA: Diagnosis not present

## 2023-01-25 DIAGNOSIS — E1122 Type 2 diabetes mellitus with diabetic chronic kidney disease: Secondary | ICD-10-CM | POA: Diagnosis not present

## 2023-01-25 DIAGNOSIS — M868X8 Other osteomyelitis, other site: Secondary | ICD-10-CM | POA: Diagnosis not present

## 2023-01-26 ENCOUNTER — Other Ambulatory Visit (HOSPITAL_COMMUNITY): Payer: Self-pay

## 2023-01-26 NOTE — Telephone Encounter (Signed)
Patient called and stated that [facility] never received paperwork.  Printed and refaxed to [employer] as requested.

## 2023-01-27 ENCOUNTER — Other Ambulatory Visit: Payer: Self-pay

## 2023-01-27 DIAGNOSIS — N2581 Secondary hyperparathyroidism of renal origin: Secondary | ICD-10-CM | POA: Diagnosis not present

## 2023-01-27 DIAGNOSIS — M868X8 Other osteomyelitis, other site: Secondary | ICD-10-CM | POA: Diagnosis not present

## 2023-01-27 DIAGNOSIS — N186 End stage renal disease: Secondary | ICD-10-CM | POA: Diagnosis not present

## 2023-01-27 DIAGNOSIS — D631 Anemia in chronic kidney disease: Secondary | ICD-10-CM | POA: Diagnosis not present

## 2023-01-27 DIAGNOSIS — Z992 Dependence on renal dialysis: Secondary | ICD-10-CM | POA: Diagnosis not present

## 2023-01-27 DIAGNOSIS — E1122 Type 2 diabetes mellitus with diabetic chronic kidney disease: Secondary | ICD-10-CM | POA: Diagnosis not present

## 2023-01-30 DIAGNOSIS — Z992 Dependence on renal dialysis: Secondary | ICD-10-CM | POA: Diagnosis not present

## 2023-01-30 DIAGNOSIS — N2581 Secondary hyperparathyroidism of renal origin: Secondary | ICD-10-CM | POA: Diagnosis not present

## 2023-01-30 DIAGNOSIS — D631 Anemia in chronic kidney disease: Secondary | ICD-10-CM | POA: Diagnosis not present

## 2023-01-30 DIAGNOSIS — N186 End stage renal disease: Secondary | ICD-10-CM | POA: Diagnosis not present

## 2023-01-30 DIAGNOSIS — E1122 Type 2 diabetes mellitus with diabetic chronic kidney disease: Secondary | ICD-10-CM | POA: Diagnosis not present

## 2023-01-30 DIAGNOSIS — M868X8 Other osteomyelitis, other site: Secondary | ICD-10-CM | POA: Diagnosis not present

## 2023-02-01 DIAGNOSIS — N186 End stage renal disease: Secondary | ICD-10-CM | POA: Diagnosis not present

## 2023-02-01 DIAGNOSIS — M868X8 Other osteomyelitis, other site: Secondary | ICD-10-CM | POA: Diagnosis not present

## 2023-02-01 DIAGNOSIS — N2581 Secondary hyperparathyroidism of renal origin: Secondary | ICD-10-CM | POA: Diagnosis not present

## 2023-02-01 DIAGNOSIS — E1122 Type 2 diabetes mellitus with diabetic chronic kidney disease: Secondary | ICD-10-CM | POA: Diagnosis not present

## 2023-02-01 DIAGNOSIS — D631 Anemia in chronic kidney disease: Secondary | ICD-10-CM | POA: Diagnosis not present

## 2023-02-01 DIAGNOSIS — Z992 Dependence on renal dialysis: Secondary | ICD-10-CM | POA: Diagnosis not present

## 2023-02-02 ENCOUNTER — Ambulatory Visit (HOSPITAL_COMMUNITY)
Admission: RE | Admit: 2023-02-02 | Discharge: 2023-02-02 | Disposition: A | Discharge status: Home / Self Care | Payer: 59 | Source: Ambulatory Visit | Attending: Gastroenterology | Admitting: Gastroenterology

## 2023-02-02 DIAGNOSIS — Q2730 Arteriovenous malformation, site unspecified: Secondary | ICD-10-CM | POA: Diagnosis not present

## 2023-02-02 LAB — HEMOGLOBIN A1C: Hemoglobin A1C: 6.9

## 2023-02-02 LAB — BASIC METABOLIC PANEL
BUN: 59 — AB (ref 4–21)
Chloride: 97 — AB (ref 99–108)
Creatinine: 10.1 — AB (ref 0.6–1.3)
Glucose: 186
Potassium: 4.9 mEq/L (ref 3.5–5.1)
Sodium: 135 — AB (ref 137–147)

## 2023-02-02 LAB — IRON,TIBC AND FERRITIN PANEL
Ferritin: 120
Iron: 92
TIBC: 304
UIBC: 212

## 2023-02-02 LAB — CBC AND DIFFERENTIAL
HCT: 37 — AB (ref 41–53)
Hemoglobin: 12.5 — AB (ref 13.5–17.5)
Neutrophils Absolute: 49.7
Platelets: 146 10*3/uL — AB (ref 150–400)
WBC: 5.6

## 2023-02-02 LAB — COMPREHENSIVE METABOLIC PANEL
Albumin: 4.1 (ref 3.5–5.0)
Calcium: 9.4 (ref 8.7–10.7)
Globulin: 3.7

## 2023-02-02 LAB — HEPATIC FUNCTION PANEL: Alkaline Phosphatase: 103 (ref 25–125)

## 2023-02-02 LAB — CBC: RBC: 3.44 — AB (ref 3.87–5.11)

## 2023-02-02 MED ORDER — OCTREOTIDE ACETATE 20 MG IM KIT
10.0000 mg | PACK | INTRAMUSCULAR | Status: DC
  Administered 2023-02-02: 10 mg via INTRAMUSCULAR
  Filled 2023-02-02: qty 1

## 2023-02-03 ENCOUNTER — Encounter (HOSPITAL_COMMUNITY): Payer: Self-pay

## 2023-02-03 DIAGNOSIS — N186 End stage renal disease: Secondary | ICD-10-CM | POA: Diagnosis not present

## 2023-02-03 DIAGNOSIS — D631 Anemia in chronic kidney disease: Secondary | ICD-10-CM | POA: Diagnosis not present

## 2023-02-03 DIAGNOSIS — Z992 Dependence on renal dialysis: Secondary | ICD-10-CM | POA: Diagnosis not present

## 2023-02-03 DIAGNOSIS — M868X8 Other osteomyelitis, other site: Secondary | ICD-10-CM | POA: Diagnosis not present

## 2023-02-03 DIAGNOSIS — N2581 Secondary hyperparathyroidism of renal origin: Secondary | ICD-10-CM | POA: Diagnosis not present

## 2023-02-03 DIAGNOSIS — E1122 Type 2 diabetes mellitus with diabetic chronic kidney disease: Secondary | ICD-10-CM | POA: Diagnosis not present

## 2023-02-06 ENCOUNTER — Other Ambulatory Visit: Payer: Self-pay

## 2023-02-06 DIAGNOSIS — N186 End stage renal disease: Secondary | ICD-10-CM | POA: Diagnosis not present

## 2023-02-06 DIAGNOSIS — Z992 Dependence on renal dialysis: Secondary | ICD-10-CM | POA: Diagnosis not present

## 2023-02-06 DIAGNOSIS — M868X8 Other osteomyelitis, other site: Secondary | ICD-10-CM | POA: Diagnosis not present

## 2023-02-06 DIAGNOSIS — N2581 Secondary hyperparathyroidism of renal origin: Secondary | ICD-10-CM | POA: Diagnosis not present

## 2023-02-06 DIAGNOSIS — E1122 Type 2 diabetes mellitus with diabetic chronic kidney disease: Secondary | ICD-10-CM | POA: Diagnosis not present

## 2023-02-06 DIAGNOSIS — D631 Anemia in chronic kidney disease: Secondary | ICD-10-CM | POA: Diagnosis not present

## 2023-02-06 MED ORDER — FOLIC ACID 1 MG PO TABS: ORAL_TABLET | ORAL | 2 refills | Status: DC

## 2023-02-08 DIAGNOSIS — Z992 Dependence on renal dialysis: Secondary | ICD-10-CM | POA: Diagnosis not present

## 2023-02-08 DIAGNOSIS — E1122 Type 2 diabetes mellitus with diabetic chronic kidney disease: Secondary | ICD-10-CM | POA: Diagnosis not present

## 2023-02-08 DIAGNOSIS — D631 Anemia in chronic kidney disease: Secondary | ICD-10-CM | POA: Diagnosis not present

## 2023-02-08 DIAGNOSIS — M868X8 Other osteomyelitis, other site: Secondary | ICD-10-CM | POA: Diagnosis not present

## 2023-02-08 DIAGNOSIS — N2581 Secondary hyperparathyroidism of renal origin: Secondary | ICD-10-CM | POA: Diagnosis not present

## 2023-02-08 DIAGNOSIS — N186 End stage renal disease: Secondary | ICD-10-CM | POA: Diagnosis not present

## 2023-02-10 DIAGNOSIS — N2581 Secondary hyperparathyroidism of renal origin: Secondary | ICD-10-CM | POA: Diagnosis not present

## 2023-02-10 DIAGNOSIS — D631 Anemia in chronic kidney disease: Secondary | ICD-10-CM | POA: Diagnosis not present

## 2023-02-10 DIAGNOSIS — N186 End stage renal disease: Secondary | ICD-10-CM | POA: Diagnosis not present

## 2023-02-10 DIAGNOSIS — M868X8 Other osteomyelitis, other site: Secondary | ICD-10-CM | POA: Diagnosis not present

## 2023-02-10 DIAGNOSIS — Z992 Dependence on renal dialysis: Secondary | ICD-10-CM | POA: Diagnosis not present

## 2023-02-10 DIAGNOSIS — E1122 Type 2 diabetes mellitus with diabetic chronic kidney disease: Secondary | ICD-10-CM | POA: Diagnosis not present

## 2023-02-13 ENCOUNTER — Telehealth: Payer: Self-pay

## 2023-02-13 ENCOUNTER — Other Ambulatory Visit: Payer: Self-pay

## 2023-02-13 DIAGNOSIS — Z992 Dependence on renal dialysis: Secondary | ICD-10-CM | POA: Diagnosis not present

## 2023-02-13 DIAGNOSIS — D631 Anemia in chronic kidney disease: Secondary | ICD-10-CM | POA: Diagnosis not present

## 2023-02-13 DIAGNOSIS — M868X8 Other osteomyelitis, other site: Secondary | ICD-10-CM | POA: Diagnosis not present

## 2023-02-13 DIAGNOSIS — N2581 Secondary hyperparathyroidism of renal origin: Secondary | ICD-10-CM | POA: Diagnosis not present

## 2023-02-13 DIAGNOSIS — E1122 Type 2 diabetes mellitus with diabetic chronic kidney disease: Secondary | ICD-10-CM | POA: Diagnosis not present

## 2023-02-13 DIAGNOSIS — N186 End stage renal disease: Secondary | ICD-10-CM | POA: Diagnosis not present

## 2023-02-13 MED ORDER — INSULIN LISPRO (1 UNIT DIAL) 100 UNIT/ML (KWIKPEN): PEN_INJECTOR | SUBCUTANEOUS | 1 refills | Status: DC

## 2023-02-13 NOTE — Telephone Encounter (Signed)
Refill request received from pharmacy °

## 2023-02-13 NOTE — Telephone Encounter (Unsigned)
Patient request refill on oxycodone. Medication last refilled 12/28/2022. Updated treatment agreement on file  Medication pended and sent to Dr. Jacalyn Lefevre

## 2023-02-14 ENCOUNTER — Ambulatory Visit (INDEPENDENT_AMBULATORY_CARE_PROVIDER_SITE_OTHER): Payer: 59 | Admitting: Family

## 2023-02-14 ENCOUNTER — Encounter: Payer: Self-pay | Admitting: Family

## 2023-02-14 VITALS — BP 127/88 | HR 70 | Temp 98.0°F | Resp 18 | Ht 69.0 in | Wt 193.0 lb

## 2023-02-14 DIAGNOSIS — Z0000 Encounter for general adult medical examination without abnormal findings: Secondary | ICD-10-CM

## 2023-02-14 MED ORDER — OXYCODONE-ACETAMINOPHEN 5-325 MG PO TABS: 1.0000 | ORAL_TABLET | Freq: Four times a day (QID) | ORAL | 0 refills | Status: DC | PRN

## 2023-02-14 NOTE — Progress Notes (Signed)
Subjective:   Jacob [name] is a 72 y.o. male who presents for Medicare Annual/Subsequent preventive examination.  Review of Systems     Cardiac Risk Factors include: advanced age (>93men, >65 women);diabetes mellitus;family history of premature cardiovascular disease;hypertension;male gender;sedentary lifestyle;smoking/ tobacco exposure     Objective:    Today's Vitals   02/14/23 1258 02/14/23 1313  BP: 127/88   Pulse: 70   Resp: 18   Temp: 98 F (36.7 C)   SpO2: 96%   Weight: 193 lb (87.5 kg)   Height:  (1.753 m)   PainSc:  2    Body mass index is 28.5 kg/m.     02/14/2023    1:05 PM 02/14/2023   12:56 PM 10/01/2022    8:46 AM 09/30/2022    2:57 AM 08/02/2022   10:41 AM 07/18/2022   11:47 AM 05/24/2022    3:52 PM  Advanced Directives  Does Patient Have a Medical Advance Directive? Yes Yes No No Yes No Yes  Type of Advance Directive Living will Living will   Healthcare Power of [place];Living will;Out of facility DNR (pink MOST or yellow form)  Healthcare Power of [place];Living will  Does patient want to make changes to medical advance directive? No - Patient declined No - Patient declined No - Patient declined  No - Patient declined  No - Patient declined  Copy of Healthcare Power of Attorney in Chart?   No - copy requested  No - copy requested  No - copy requested  Would patient like information on creating a medical advance directive?   No - Patient declined   No - Patient declined     Current Medications (verified) Outpatient Encounter Medications as of 02/14/2023  Medication Sig   acetaminophen (TYLENOL) 650 MG CR tablet Take 1,300 mg by mouth 3 (three) times daily.   allopurinol (ZYLOPRIM) 100 MG tablet Take one tablet by mouth once daily.   atorvastatin (LIPITOR) 10 MG tablet TAKE 1 TABLET(10 MG) BY MOUTH DAILY   B-D UF III MINI PEN NEEDLES 31G X 5 MM MISC USE 4 TIMES DAILY   BIKTARVY 50-200-25 MG TABS tablet Take 1 tablet by mouth daily.   Calcium  Acetate 667 MG TABS Take 667-2,001 mg by mouth See admin instructions. Take 2001 mg by mouth three times a day with meals and 667 mg with each snack   carvedilol (COREG) 3.125 MG tablet Take 1 tablet (3.125 mg total) by mouth 2 (two) times daily with a meal.   diclofenac Sodium (VOLTAREN) 1 % GEL Apply 2 grams topically to the affected area three times daily as needed for pain.   folic acid (FOLVITE) 1 MG tablet TAKE 1 TABLET(1 MG) BY MOUTH DAILY   gabapentin (NEURONTIN) 300 MG capsule TAKE 1 CAPSULE BY MOUTH DAILY.   gentamicin ointment (GARAMYCIN) 0.1 % Apply 1 Application topically daily.   glucose blood (ONETOUCH ULTRA) test strip Check blood sugar three times daily E11.40   insulin lispro (HUMALOG) 100 UNIT/ML KwikPen INJECT 20 UNITS UNDER THE SKIN THREE TIMES DAILY AS NEEDED FOR HIGH BLOOD SUGAR(ABOVE 150)   isosorbide mononitrate (IMDUR) 30 MG 24 hr tablet Take 1 tablet (30 mg total) by mouth daily.   ketoconazole (NIZORAL) 2 % cream Apply 1 Application topically daily.   Lancets (ONETOUCH DELICA PLUS LANCET33G) MISC USE 3 TIMES DAILY   latanoprost (XALATAN) 0.005 % ophthalmic solution Place 1 drop into both eyes at bedtime.   lidocaine-prilocaine (EMLA) cream Apply 1 application topically  every Monday, Wednesday, and Friday with hemodialysis.   Menthol-Camphor (TIGER BALM ARTHRITIS RUB) 11-11 % CREA Apply 1 Application topically daily as needed (pain).   multivitamin (RENA-VIT) TABS tablet Take 1 tablet by mouth daily.   nitroGLYCERIN (NITROSTAT) 0.3 MG SL tablet PLACE 1 TABLET UNDER THE TONGUE AS NEEDED FOR CHEST PAIN   octreotide (SANDOSTATIN LAR DEPOT) 10 MG injection Inject 10 mg into the muscle every 28 days.   ondansetron (ZOFRAN) 4 MG tablet TAKE 1 TABLET(4 MG) BY MOUTH EVERY 8 HOURS AS NEEDED FOR NAUSEA OR VOMITING   oxyCODONE-acetaminophen (PERCOCET) 5-325 MG tablet Take 1 tablet by mouth every 6 (six) hours as needed for severe pain.   timolol (TIMOPTIC) 0.5 % ophthalmic solution  Place 1 drop into both eyes in the morning.   umeclidinium-vilanterol (ANORO ELLIPTA) 62.5-25 MCG/ACT AEPB INHALE 1 PUFF BY MOUTH EVERY DAY   No facility-administered encounter medications on file as of 02/14/2023.    Allergies (verified) Augmentin [amoxicillin-pot clavulanate], Morphine sulfate, Mucinex fast-max, and Amphetamines   History: Past Medical History:  Diagnosis Date   Acute respiratory failure 03/01/2018   Anemia    Arthritis    "all over; mostly knees and back" (02/28/2018)   Chronic combined systolic and diastolic CHF (congestive heart failure)    Chronic lower back pain    stenosis   Community acquired pneumonia 09/06/2013   COPD (chronic obstructive pulmonary disease)    Coronary atherosclerosis of native coronary artery    a. 02/2003 s/p CABG x 2 (VG->RI, VG->RPDA; b. 11/2019 PCI: LM nl, LAD 90d, D3 50, RI 100, LCX 100p, OM3 100 - fills via L->L collats from D2/dLAD, RCA 100p, VG->RPDA ok, VG->RI 95 (3.5x48 Synergy XD DES).   Drug abuse    hx; tested for cocaine as recently as 2/08. says he is not using drugs now - avoided defib. for this reason    ESRD (end stage renal disease)    Hemo M-W-F- [facility]   Fall at home 10/2020   GERD (gastroesophageal reflux disease)    takes OTC meds as needed   GI bleeding    a. 11/2019 EGD: angiodysplastic lesions w/ bleeding s/p argon plasma/clipping/epi inj. Multiple admissions for the same.   Glaucoma    uses eye drops daily   Hepatitis B 1968   "tx'd w/isolation; caught it from toilet stools in gym"   History of blood transfusion 03/01/2019   History of colon polyps    benign   History of gout    takes Allopurinol daily as well as Colchicine-if needed (02/28/2018)   History of kidney stones    HTN (hypertension)    takes Coreg,Imdur.and Apresoline daily   Human immunodeficiency virus (HIV) disease dx'd 1995   on Biktarvy as of 12/2020.     Hyperlipidemia    Ischemic cardiomyopathy    a. 01/2019 Echo: EF 40-45%, diffuse HK,  mild basal septal hypertrophy. Diast dysfxn. Nl RV size/fxn. Sev dil LA. Triv MR/TR/PR.   Muscle spasm    takes Zanaflex as needed   Myocardial infarction ~ 2004/2005   Nocturia    Peripheral neuropathy    takes gabapentin daily   Pneumonia    "at least twice" (02/28/2018)   SDH (subdural hematoma)    Syphilis, unspecified    Type II diabetes mellitus 2004   Lantus daily.Average fasting blood sugar 125-199   Wears glasses    Wears partial dentures    Past Surgical History:  Procedure Laterality Date   AV FISTULA  PLACEMENT Left 08/02/2018   Procedure: ARTERIOVENOUS (AV) FISTULA CREATION  left arm radiocephlic;  Surgeon: Cephus Shelling, MD;  Location: [facility] OR;  Service: Vascular;  Laterality: Left;   AV FISTULA PLACEMENT Left 08/01/2019   Procedure: LEFT BRACHIOCEPHALIC ARTERIOVENOUS (AV) FISTULA CREATION;  Surgeon: Larina Earthly, MD;  Location: [facility] OR;  Service: Vascular;  Laterality: Left;   AV FISTULA PLACEMENT Right 04/12/2022   Procedure: RIGHT UPPER EXTREMITY ARTERIOVENOUS (AV)  GRAFT INSERTION USING GORE STRETCH 4-7 MM;  Surgeon: Chuck Hint, MD;  Location: [facility] OR;  Service: Vascular;  Laterality: Right;   BASCILIC VEIN TRANSPOSITION Left 10/03/2019   Procedure: BASILIC VEIN TRANSPOSITION LEFT SECOND STAGE;  Surgeon: Larina Earthly, MD;  Location: [facility] OR;  Service: Vascular;  Laterality: Left;   BIOPSY  01/25/2021   Procedure: BIOPSY;  Surgeon: Sherrilyn Rist, MD;  Location: [facility] ENDOSCOPY;  Service: Gastroenterology;;   CARDIAC CATHETERIZATION  10/2002; 12/19/2004   Hattie Perch 03/08/2011   COLONOSCOPY  2013   Dr.John Marina Goodell    CORONARY ARTERY BYPASS GRAFT  02/24/2003   CABG X2/notes 03/08/2011   CORONARY STENT INTERVENTION N/A 12/19/2019   Procedure: CORONARY STENT INTERVENTION;  Surgeon: Corky Crafts, MD;  Location: [facility] INVASIVE CV LAB;  Service: Cardiovascular;  Laterality: N/A;   CORONARY ULTRASOUND/IVUS N/A 12/19/2019   Procedure: Intravascular Ultrasound/IVUS;   Surgeon: Corky Crafts, MD;  Location: [facility] INVASIVE CV LAB;  Service: Cardiovascular;  Laterality: N/A;   ENTEROSCOPY N/A 01/25/2021   Procedure: ENTEROSCOPY;  Surgeon: Sherrilyn Rist, MD;  Location: [facility] ENDOSCOPY;  Service: Gastroenterology;  Laterality: N/A;   ENTEROSCOPY N/A 02/13/2021   Procedure: ENTEROSCOPY;  Surgeon: Lynann Bologna, MD;  Location: [facility] ENDOSCOPY;  Service: Endoscopy;  Laterality: N/A;   ENTEROSCOPY N/A 05/07/2021   Procedure: ENTEROSCOPY;  Surgeon: Benancio Deeds, MD;  Location: [facility] ENDOSCOPY;  Service: Gastroenterology;  Laterality: N/A;   ESOPHAGOGASTRODUODENOSCOPY (EGD) WITH PROPOFOL N/A 02/08/2019   Procedure: ESOPHAGOGASTRODUODENOSCOPY (EGD) WITH PROPOFOL;  Surgeon: Rachael Fee, MD;  Location: [facility] ENDOSCOPY;  Service: Gastroenterology;  Laterality: N/A;   ESOPHAGOGASTRODUODENOSCOPY (EGD) WITH PROPOFOL N/A 12/22/2019   Procedure: ESOPHAGOGASTRODUODENOSCOPY (EGD) WITH PROPOFOL;  Surgeon: Shellia Cleverly, DO;  Location: [facility] ENDOSCOPY;  Service: Gastroenterology;  Laterality: N/A;   ESOPHAGOGASTRODUODENOSCOPY (EGD) WITH PROPOFOL N/A 10/19/2020   Procedure: ESOPHAGOGASTRODUODENOSCOPY (EGD) WITH PROPOFOL;  Surgeon: Lynann Bologna, MD;  Location: [facility] ENDOSCOPY;  Service: Endoscopy;  Laterality: N/A;   ESOPHAGOGASTRODUODENOSCOPY (EGD) WITH PROPOFOL N/A 12/22/2020   Procedure: ESOPHAGOGASTRODUODENOSCOPY (EGD) WITH PROPOFOL;  Surgeon: Iva Boop, MD;  Location: [facility] ENDOSCOPY;  Service: Endoscopy;  Laterality: N/A;   ESOPHAGOGASTRODUODENOSCOPY (EGD) WITH PROPOFOL N/A 01/09/2021   Procedure: ESOPHAGOGASTRODUODENOSCOPY (EGD) WITH PROPOFOL;  Surgeon: Hilarie Fredrickson, MD;  Location: [facility] ENDOSCOPY;  Service: Endoscopy;  Laterality: N/A;   HEMOSTASIS CLIP PLACEMENT  12/22/2019   Procedure: HEMOSTASIS CLIP PLACEMENT;  Surgeon: Shellia Cleverly, DO;  Location: [facility] ENDOSCOPY;  Service: Gastroenterology;;   HEMOSTASIS CLIP PLACEMENT  12/22/2020   Procedure: HEMOSTASIS CLIP PLACEMENT;   Surgeon: Iva Boop, MD;  Location: [facility] ENDOSCOPY;  Service: Endoscopy;;   HEMOSTASIS CONTROL  12/22/2020   Procedure: HEMOSTASIS CONTROL/hemospray;  Surgeon: Iva Boop, MD;  Location: [facility] ENDOSCOPY;  Service: Endoscopy;;   HOT HEMOSTASIS N/A 02/08/2019   Procedure: HOT HEMOSTASIS (ARGON PLASMA COAGULATION/BICAP);  Surgeon: Rachael Fee, MD;  Location: [facility] ENDOSCOPY;  Service: Gastroenterology;  Laterality: N/A;   HOT HEMOSTASIS N/A 12/22/2019   Procedure: HOT HEMOSTASIS (ARGON PLASMA  COAGULATION/BICAP);  Surgeon: Shellia Cleverly, DO;  Location: [facility] ENDOSCOPY;  Service: Gastroenterology;  Laterality: N/A;   HOT HEMOSTASIS N/A 10/19/2020   Procedure: HOT HEMOSTASIS (ARGON PLASMA COAGULATION/BICAP);  Surgeon: Lynann Bologna, MD;  Location: [facility] ENDOSCOPY;  Service: Endoscopy;  Laterality: N/A;   HOT HEMOSTASIS N/A 12/22/2020   Procedure: HOT HEMOSTASIS (ARGON PLASMA COAGULATION/BICAP);  Surgeon: Iva Boop, MD;  Location: [facility] ENDOSCOPY;  Service: Endoscopy;  Laterality: N/A;   HOT HEMOSTASIS N/A 01/09/2021   Procedure: HOT HEMOSTASIS (ARGON PLASMA COAGULATION/BICAP);  Surgeon: Hilarie Fredrickson, MD;  Location: [facility] ENDOSCOPY;  Service: Endoscopy;  Laterality: N/A;   HOT HEMOSTASIS N/A 01/25/2021   Procedure: HOT HEMOSTASIS (ARGON PLASMA COAGULATION/BICAP);  Surgeon: Sherrilyn Rist, MD;  Location: [facility] ENDOSCOPY;  Service: Gastroenterology;  Laterality: N/A;   HOT HEMOSTASIS N/A 02/13/2021   Procedure: HOT HEMOSTASIS (ARGON PLASMA COAGULATION/BICAP);  Surgeon: Lynann Bologna, MD;  Location: [facility] ENDOSCOPY;  Service: Endoscopy;  Laterality: N/A;   HOT HEMOSTASIS N/A 05/07/2021   Procedure: HOT HEMOSTASIS (ARGON PLASMA COAGULATION/BICAP);  Surgeon: Benancio Deeds, MD;  Location: [facility] ENDOSCOPY;  Service: Gastroenterology;  Laterality: N/A;   INSERTION OF DIALYSIS CATHETER N/A 02/08/2022   Procedure: INSERTION OF TUNNELED DIALYSIS CATHETER;  Surgeon: Chuck Hint, MD;  Location: [facility] OR;   Service: Vascular;  Laterality: N/A;   INTERTROCHANTERIC HIP FRACTURE SURGERY Left 11/2006   Hattie Perch 03/08/2011   IR FLUORO GUIDE CV LINE LEFT  03/14/2022   IR FLUORO GUIDE CV LINE RIGHT  07/24/2019   IR FLUORO GUIDE CV LINE RIGHT  07/30/2019   IR US GUIDE VASC ACCESS RIGHT  07/24/2019   IR US GUIDE VASC ACCESS RIGHT  07/30/2019   LAPAROSCOPIC CHOLECYSTECTOMY  05/2006   LIGATION OF ARTERIOVENOUS  FISTULA Left 02/08/2022   Procedure: LIGATION OF LEFT ARTERIOVENOUS  FISTULA;  Surgeon: Chuck Hint, MD;  Location: [facility] OR;  Service: Vascular;  Laterality: Left;   LIGATION OF COMPETING BRANCHES OF ARTERIOVENOUS FISTULA Left 11/05/2018   Procedure: LIGATION OF COMPETING BRANCHES OF ARTERIOVENOUS FISTULA  LEFT  ARM;  Surgeon: Cephus Shelling, MD;  Location: [facility] OR;  Service: Vascular;  Laterality: Left;   LUMBAR LAMINECTOMY/DECOMPRESSION MICRODISCECTOMY N/A 02/29/2016   Procedure: Left L4-5 Lateral Recess Decompression, Removal Extradural Intraspinal Facet Cyst;  Surgeon: Eldred Manges, MD;  Location: [facility] OR;  Service: Orthopedics;  Laterality: N/A;   METATARSAL HEAD EXCISION Right 07/20/2022   Procedure: Irragation and debridement of right foot wound with extraction of all necrotic soft tissue and bone;  Surgeon: Pilar Plate, DPM;  Location: [facility] OR;  Service: Podiatry;  Laterality: Right;  Right 4th Met head and base of proximal phalanx resection   MULTIPLE TOOTH EXTRACTIONS     ORIF MANDIBULAR FRACTURE Left 08/13/2004   ORIF of left body fracture mandible with KLS Martin 2.3-mm six hole/notes 03/08/2011   RIGHT/LEFT HEART CATH AND CORONARY/GRAFT ANGIOGRAPHY N/A 12/19/2019   Procedure: RIGHT/LEFT HEART CATH AND CORONARY/GRAFT ANGIOGRAPHY;  Surgeon: Corky Crafts, MD;  Location: [facility] INVASIVE CV LAB;  Service: Cardiovascular;  Laterality: N/A;   SCLEROTHERAPY  12/22/2019   Procedure: SCLEROTHERAPY;  Surgeon: Shellia Cleverly, DO;  Location: [facility] ENDOSCOPY;  Service: Gastroenterology;;    Susa Day  02/13/2021   Procedure: Susa Day;  Surgeon: Lynann Bologna, MD;  Location: [facility] ENDOSCOPY;  Service: Endoscopy;;   Family History  Problem Relation Age of Onset   Heart failure Father    Hypertension Father    Diabetes Brother    Heart  attack Brother    Alzheimer's disease Mother    Stroke Sister    Diabetes Sister    Alzheimer's disease Sister    Hypertension Brother    Diabetes Brother    Drug abuse Brother    Colon cancer Neg Hx    Social History   Socioeconomic History   Marital status: Single    Spouse name: Not on file   Number of children: 1   Years of education: Not on file   Highest education level: Not on file  Occupational History   Occupation: retired  Tobacco Use   Smoking status: Every Day    Packs/day: 0.50    Years: 43.00    Additional pack years: 0.00    Total pack years: 21.50    Types: Cigarettes    Passive exposure: Never   Smokeless tobacco: Never  Vaping Use   Vaping Use: Never used  Substance and Sexual Activity   Alcohol use: Yes    Alcohol/week: 12.0 standard drinks of alcohol    Types: 12 Standard drinks or equivalent per week    Comment: occassional   Drug use: Not Currently    Types: Cocaine    Comment: hx of crack/cocaine 104yrs ago 10/01/2019- none   Sexual activity: Yes    Partners: Male    Comment: Declined condoms  Other Topics Concern   Not on file  Social History Narrative   Retired.       As of 05/2015:   Diet: No salt    Caffeine   Married: Single    House: Condo, 2 stories, 1 person (self)    Pets: No    Current/Past profession: N/A   Exercise: walks daily    Living Will: Yes    DNR   POA/HPOA: No       Social Determinants of Health   Financial Resource Strain: Low Risk  (10/26/2020)   Overall Financial Resource Strain (CARDIA)    Difficulty of Paying Living Expenses: Not hard at all  Food Insecurity: No Food Insecurity (08/03/2022)   Hunger Vital Sign    Worried About Running Out of Food in  the Last Year: Never true    Ran Out of Food in the Last Year: Never true  Transportation Needs: No Transportation Needs (09/20/2022)   PRAPARE - Administrator, Civil Service (Medical): No    Lack of Transportation (Non-Medical): No  Recent Concern: Transportation Needs - Unmet Transportation Needs (08/03/2022)   PRAPARE - Transportation    Lack of Transportation (Medical): Yes    Lack of Transportation (Non-Medical): Yes  Physical Activity: Insufficiently Active (06/26/2018)   Exercise Vital Sign    Days of Exercise per Week: 7 days    Minutes of Exercise per Session: 20 min  Stress: No Stress Concern Present (06/26/2018)   [employer] of Occupational Health - Occupational Stress Questionnaire    Feeling of Stress : Only a little  Social Connections: Somewhat Isolated (06/26/2018)   Social Connection and Isolation Panel [NHANES]    Frequency of Communication with Friends and Family: Three times a week    Frequency of Social Gatherings with Friends and Family: Three times a week    Attends Religious Services: More than 4 times per year    Active Member of Clubs or Organizations: No    Attends Banker Meetings: Never    Marital Status: Never married    Tobacco Counseling Ready to quit: Not Answered Counseling given: Not Answered  Clinical Intake:  Pre-visit preparation completed: No  Pain : 0-10 Pain Score: 2  Pain Type: Chronic pain Pain Location: Back (right leg and feet) Pain Orientation: Right, Lower Pain Radiating Towards: right leg Pain Descriptors / Indicators: Aching, Burning Pain Frequency: Constant Pain Relieving Factors: oxycodone Effect of Pain on Daily Activities: walking  Pain Relieving Factors: oxycodone  BMI - recorded: 28.5 Nutritional Status: BMI 25 -29 Overweight Nutritional Risks: Non-healing wound (follows up with foot and Ankle specialist x 2 months) Diabetes: Yes CBG done?: Yes (219 prior to visit) CBG resulted in  Enter/ Edit results?: No (recall readings) Did pt. bring in CBG monitor from home?: No  How often do you need to have someone help you when you read instructions, pamphlets, or other written materials from your doctor or pharmacy?: 1 - Never What is the last grade level you completed in school?: 14 yrs  Diabetic?Yes   Interpreter Needed?: No      Activities of Daily Living    02/14/2023    1:25 PM 10/01/2022    8:45 AM  In your present state of health, do you have any difficulty performing the following activities:  Hearing? 0   Vision? 0   Difficulty concentrating or making decisions? 1   Comment Remembering   Walking or climbing stairs? 1   Comment walks with a cane   Dressing or bathing? 0   Doing errands, shopping? 0 0  Preparing Food and eating ? N   Using the Toilet? N   In the past six months, have you accidently leaked urine? N   Comment on diaylsis   Do you have problems with loss of bowel control? N   Managing your Medications? N   Managing your Finances? N   Housekeeping or managing your Housekeeping? N     Patient Care Team: [name], MD as PCP - General (Family Medicine) [name], MD as PCP - Infectious Diseases (Infectious Diseases) [name], MD as PCP - Cardiology (Cardiology) [name], MD as Consulting Physician (Ophthalmology) [name], MD as Consulting Physician (Cardiology) [name], MD as Consulting Physician (Infectious Diseases) [name], DPM as Consulting Physician (Podiatry) Dedric Ethington, [name], NP as Nurse Practitioner (Family Medicine) [facility], [facility]  Indicate any recent Medical Services you may have received from other than [facility] providers in the past year (date may be approximate).     Assessment:   This is a routine wellness examination for Ricco.  Hearing/Vision screen Hearing Screening - Comments:: No hearing concerns.  Vision Screening -  Comments:: No vision concerns. Patient last eye exam Jan 2024. Patient wears prescription glasses   Dietary issues and exercise activities discussed: Current Exercise Habits: The patient does not participate in regular exercise at present, Exercise limited by: orthopedic condition(s) (back and right hip pain)   Goals Addressed             This Visit's Progress    Patient Stated       Improve pain after dialysis        Depression Screen    02/14/2023   12:56 PM 09/08/2022   10:04 AM 08/04/2022    8:49 AM 09/07/2021    1:38 PM 11/03/2020    9:50 AM 08/04/2020    9:56 AM 07/07/2020   10:12 AM  PHQ 2/9 Scores  PHQ - 2 Score 0 3 0 0 0 0 0  PHQ- 9 Score  16  Fall Risk    02/14/2023   12:56 PM 09/08/2022   10:13 AM 08/04/2022    8:49 AM 08/02/2022   10:41 AM 05/24/2022    3:52 PM  Fall Risk   Falls in the past year? 1 1 0 1 0  Number falls in past yr: 1 1 0 0 0  Injury with Fall? 1 1 0 0 0  Risk for fall due to : History of fall(s) History of fall(s);Impaired balance/gait;Impaired mobility History of fall(s);Impaired balance/gait History of fall(s);Impaired balance/gait   Follow up Falls evaluation completed Falls evaluation completed Falls evaluation completed Falls evaluation completed     FALL RISK PREVENTION PERTAINING TO THE HOME:  Any stairs in or around the home? No  If so, are there any without handrails? No  Home free of loose throw rugs in walkways, pet beds, electrical cords, etc? No  Adequate lighting in your home to reduce risk of falls? Yes   ASSISTIVE DEVICES UTILIZED TO PREVENT FALLS:  Life alert? No  Use of a cane, walker or w/c? Yes  Grab bars in the bathroom? Yes  Shower chair or bench in shower? Yes  Elevated toilet seat or a handicapped toilet? Yes   TIMED UP AND GO:  Was the test performed? Yes .  Length of time to ambulate 10 feet: 20 sec.   Gait slow and steady with assistive device  Cognitive Function:    02/14/2023    1:00 PM  06/28/2019    9:37 AM 06/26/2018    8:36 AM 07/21/2017    8:50 AM 08/18/2016    9:32 AM  MMSE - Mini Mental State Exam  Orientation to time 5 4 4 5 5   Orientation to Place 5 5 5 5 5   Registration 3 3 3 3 3   Attention/ Calculation 5 5 5 5 5   Recall 3 3 2 2 2   Language- name 2 objects 2 2 2 2 2   Language- repeat 1 1 1 1 1   Language- follow 3 step command 3 3 3 3 3   Language- read & follow direction 1 1 1 1 1   Write a sentence 1 1 1 1 1   Copy design 0 1 0 1 1  Total score 29 29 27 29 29         07/07/2020   10:13 AM  6CIT Screen  What Year? 0 points  What month? 0 points  What time? 0 points  Count back from 20 0 points  Months in reverse 0 points  Repeat phrase 0 points  Total Score 0 points    Immunizations Immunization History  Administered Date(s) Administered   Fluad Quad(high Dose 65+) 06/14/2019, 07/20/2021, 08/04/2022   Hepatitis B 08/28/2006, 10/02/2007, 04/01/2008   Hepatitis B, ADULT 06/03/2014, 07/04/2014   Influenza Split 07/28/2011   Influenza Whole 08/28/2006, 09/10/2007, 09/15/2008, 08/03/2009, 07/26/2010   Influenza, High Dose Seasonal PF 07/04/2018   Influenza,inj,Quad PF,6+ Mos 07/04/2014, 07/06/2015, 07/12/2016, 07/11/2017   Influenza-Unspecified 06/24/2013, 06/30/2020   PFIZER(Purple Top)SARS-COV-2 Vaccination 11/16/2019, 12/07/2019, 07/23/2020   Pfizer Covid-19 Vaccine Bivalent Booster 5yrs & up 07/15/2021   Pneumococcal Conjugate-13 08/18/2016   Pneumococcal Polysaccharide-23 08/28/2006, 07/28/2011, 05/30/2018   Tdap 06/14/2019   Zoster Recombinat (Shingrix) 07/24/2017, 01/09/2018   Zoster, Live 06/03/2014    TDAP status: Up to date  Flu Vaccine status: Up to date  Pneumococcal vaccine status: Up to date  Covid-19 vaccine status: Declined, Education has been provided regarding the importance of this vaccine but patient still declined. Advised may  receive this vaccine at local pharmacy or Health Dept.or vaccine clinic. Aware to provide a copy of  the vaccination record if obtained from local pharmacy or Health Dept. Verbalized acceptance and understanding.  Qualifies for Shingles Vaccine? Yes   Zostavax completed Yes   Shingrix Completed?: Yes  Screening Tests Health Maintenance  Topic Date Due   COVID-19 Vaccine (5 - 2023-24 season) 06/24/2022   COLONOSCOPY (Pts 45-25yrs Insurance coverage will need to be confirmed)  10/01/2022   OPHTHALMOLOGY EXAM  10/05/2022   HEMOGLOBIN A1C  11/03/2022   FOOT EXAM  11/16/2022   LIPID PANEL  02/25/2023   Lung Cancer Screening  04/15/2023   INFLUENZA VACCINE  05/25/2023   Medicare Annual Wellness (AWV)  02/14/2024   DTaP/Tdap/Td (2 - Td or Tdap) 06/13/2029   Pneumonia Vaccine 67+ Years old  Completed   Hepatitis C Screening  Completed   Zoster Vaccines- Shingrix  Completed   HPV VACCINES  Aged Out    Health Maintenance  Health Maintenance Due  Topic Date Due   COVID-19 Vaccine (5 - 2023-24 season) 06/24/2022   COLONOSCOPY (Pts 45-38yrs Insurance coverage will need to be confirmed)  10/01/2022   OPHTHALMOLOGY EXAM  10/05/2022   HEMOGLOBIN A1C  11/03/2022   FOOT EXAM  11/16/2022    Colorectal cancer screening: Type of screening: FOBT/FIT. Completed 09/30/2022. Repeat every 2 years  Lung Cancer Screening: (Low Dose CT Chest recommended if Age 62-80 years, 30 pack-year currently smoking OR have quit w/in 15years.) does qualify.   Lung Cancer Screening Referral: CT scan done 04/15/2022   Additional Screening:  Hepatitis C Screening: does qualify; Completed yes   Vision Screening: Recommended annual ophthalmology exams for early detection of glaucoma and other disorders of the eye. Is the patient up to date with their annual eye exam?  Yes  Who is the provider or what is the name of the office in which the patient attends annual eye exams? Dr. Jola Babinski  If pt is not established with a provider, would they like to be referred to a provider to establish care? No .   Dental Screening:  Recommended annual dental exams for proper oral hygiene  Community Resource Referral / Chronic Care Management: CRR required this visit?  No   CCM required this visit?  No      Plan:     I have personally reviewed and noted the following in the patient's chart:   Medical and social history Use of alcohol, tobacco or illicit drugs  Current medications and supplements including opioid prescriptions. Patient is currently taking opioid prescriptions. Information provided to patient regarding non-opioid alternatives. Patient advised to discuss non-opioid treatment plan with their provider. Functional ability and status Nutritional status Physical activity Advanced directives List of other physicians Hospitalizations, surgeries, and ER visits in previous 12 months Vitals Screenings to include cognitive, depression, and falls Referrals and appointments  In addition, I have reviewed and discussed with patient certain preventive protocols, quality metrics, and best practice recommendations. A written personalized care plan for preventive services as well as general preventive health recommendations were provided to patient.     Caesar Bookman, NP   02/14/2023   Nurse Notes: Up to date except need podiatrist,ophthalmologist and order Hgb A1C

## 2023-02-14 NOTE — Patient Instructions (Signed)
Jacob [name] , Thank you for taking time to come for your Medicare Wellness Visit. I appreciate your ongoing commitment to your health goals. Please review the following plan we discussed and let me know if I can assist you in the future.   Screening recommendations/referrals: Colonoscopy : Up to date  Recommended yearly ophthalmology/optometry visit for glaucoma screening and checkup Recommended yearly dental visit for hygiene and checkup  Vaccinations: Influenza vaccine due annually in September/October Pneumococcal vaccine : Up to date  Tdap vaccine : Up to date  Shingles vaccine : Up to date     Advanced directives: Yes   Conditions/risks identified: advanced age (>43men, >26 women);diabetes mellitus;family history of premature cardiovascular disease;hypertension;male gender;sedentary lifestyle;smoking/ tobacco exposure  Next appointment: 1 year   Preventive Care 38 Years and Older, Male Preventive care refers to lifestyle choices and visits with your health care provider that can promote health and wellness. What does preventive care include? A yearly physical exam. This is also called an annual well check. Dental exams once or twice a year. Routine eye exams. Ask your health care provider how often you should have your eyes checked. Personal lifestyle choices, including: Daily care of your teeth and gums. Regular physical activity. Eating a healthy diet. Avoiding tobacco and drug use. Limiting alcohol use. Practicing safe sex. Taking low doses of aspirin every day. Taking vitamin and mineral supplements as recommended by your health care provider. What happens during an annual well check? The services and screenings done by your health care provider during your annual well check will depend on your age, overall health, lifestyle risk factors, and family history of disease. Counseling  Your health care provider may ask you questions about your: Alcohol use. Tobacco  use. Drug use. Emotional well-being. Home and relationship well-being. Sexual activity. Eating habits. History of falls. Memory and ability to understand (cognition). Work and work Astronomer. Screening  You may have the following tests or measurements: Height, weight, and BMI. Blood pressure. Lipid and cholesterol levels. These may be checked every 5 years, or more frequently if you are over 64 years old. Skin check. Lung cancer screening. You may have this screening every year starting at age 79 if you have a 30-pack-year history of smoking and currently smoke or have quit within the past 15 years. Fecal occult blood test (FOBT) of the stool. You may have this test every year starting at age 24. Flexible sigmoidoscopy or colonoscopy. You may have a sigmoidoscopy every 5 years or a colonoscopy every 10 years starting at age 67. Prostate cancer screening. Recommendations will vary depending on your family history and other risks. Hepatitis C blood test. Hepatitis B blood test. Sexually transmitted disease (STD) testing. Diabetes screening. This is done by checking your blood sugar (glucose) after you have not eaten for a while (fasting). You may have this done every 1-3 years. Abdominal aortic aneurysm (AAA) screening. You may need this if you are a current or former smoker. Osteoporosis. You may be screened starting at age 62 if you are at high risk. Talk with your health care provider about your test results, treatment options, and if necessary, the need for more tests. Vaccines  Your health care provider may recommend certain vaccines, such as: Influenza vaccine. This is recommended every year. Tetanus, diphtheria, and acellular pertussis (Tdap, Td) vaccine. You may need a Td booster every 10 years. Zoster vaccine. You may need this after age 30. Pneumococcal 13-valent conjugate (PCV13) vaccine. One dose is recommended after age  65. Pneumococcal polysaccharide (PPSV23) vaccine.  One dose is recommended after age 46. Talk to your health care provider about which screenings and vaccines you need and how often you need them. This information is not intended to replace advice given to you by your health care provider. Make sure you discuss any questions you have with your health care provider. Document Released: 11/06/2015 Document Revised: 06/29/2016 Document Reviewed: 08/11/2015 Elsevier Interactive Patient Education  2017 [place] Prevention in the Home Falls can cause injuries. They can happen to people of all ages. There are many things you can do to make your home safe and to help prevent falls. What can I do on the outside of my home? Regularly fix the edges of walkways and driveways and fix any cracks. Remove anything that might make you trip as you walk through a door, such as a raised step or threshold. Trim any bushes or trees on the path to your home. Use bright outdoor lighting. Clear any walking paths of anything that might make someone trip, such as rocks or tools. Regularly check to see if handrails are loose or broken. Make sure that both sides of any steps have handrails. Any raised decks and porches should have guardrails on the edges. Have any leaves, snow, or ice cleared regularly. Use sand or salt on walking paths during winter. Clean up any spills in your garage right away. This includes oil or grease spills. What can I do in the bathroom? Use night lights. Install grab bars by the toilet and in the tub and shower. Do not use towel bars as grab bars. Use non-skid mats or decals in the tub or shower. If you need to sit down in the shower, use a plastic, non-slip stool. Keep the floor dry. Clean up any water that spills on the floor as soon as it happens. Remove soap buildup in the tub or shower regularly. Attach bath mats securely with double-sided non-slip rug tape. Do not have throw rugs and other things on the floor that can make  you trip. What can I do in the bedroom? Use night lights. Make sure that you have a light by your bed that is easy to reach. Do not use any sheets or blankets that are too big for your bed. They should not hang down onto the floor. Have a firm chair that has side arms. You can use this for support while you get dressed. Do not have throw rugs and other things on the floor that can make you trip. What can I do in the kitchen? Clean up any spills right away. Avoid walking on wet floors. Keep items that you use a lot in easy-to-reach places. If you need to reach something above you, use a strong step stool that has a grab bar. Keep electrical cords out of the way. Do not use floor polish or wax that makes floors slippery. If you must use wax, use non-skid floor wax. Do not have throw rugs and other things on the floor that can make you trip. What can I do with my stairs? Do not leave any items on the stairs. Make sure that there are handrails on both sides of the stairs and use them. Fix handrails that are broken or loose. Make sure that handrails are as long as the stairways. Check any carpeting to make sure that it is firmly attached to the stairs. Fix any carpet that is loose or worn. Avoid having throw rugs at  the top or bottom of the stairs. If you do have throw rugs, attach them to the floor with carpet tape. Make sure that you have a light switch at the top of the stairs and the bottom of the stairs. If you do not have them, ask someone to add them for you. What else can I do to help prevent falls? Wear shoes that: Do not have high heels. Have rubber bottoms. Are comfortable and fit you well. Are closed at the toe. Do not wear sandals. If you use a stepladder: Make sure that it is fully opened. Do not climb a closed stepladder. Make sure that both sides of the stepladder are locked into place. Ask someone to hold it for you, if possible. Clearly mark and make sure that you can  see: Any grab bars or handrails. First and last steps. Where the edge of each step is. Use tools that help you move around (mobility aids) if they are needed. These include: Canes. Walkers. Scooters. Crutches. Turn on the lights when you go into a dark area. Replace any light bulbs as soon as they burn out. Set up your furniture so you have a clear path. Avoid moving your furniture around. If any of your floors are uneven, fix them. If there are any pets around you, be aware of where they are. Review your medicines with your doctor. Some medicines can make you feel dizzy. This can increase your chance of falling. Ask your doctor what other things that you can do to help prevent falls. This information is not intended to replace advice given to you by your health care provider. Make sure you discuss any questions you have with your health care provider. Document Released: 08/06/2009 Document Revised: 03/17/2016 Document Reviewed: 11/14/2014 Elsevier Interactive Patient Education  2017 [employer].

## 2023-02-15 DIAGNOSIS — D631 Anemia in chronic kidney disease: Secondary | ICD-10-CM | POA: Diagnosis not present

## 2023-02-15 DIAGNOSIS — M868X8 Other osteomyelitis, other site: Secondary | ICD-10-CM | POA: Diagnosis not present

## 2023-02-15 DIAGNOSIS — Z992 Dependence on renal dialysis: Secondary | ICD-10-CM | POA: Diagnosis not present

## 2023-02-15 DIAGNOSIS — E1122 Type 2 diabetes mellitus with diabetic chronic kidney disease: Secondary | ICD-10-CM | POA: Diagnosis not present

## 2023-02-15 DIAGNOSIS — N186 End stage renal disease: Secondary | ICD-10-CM | POA: Diagnosis not present

## 2023-02-15 DIAGNOSIS — N2581 Secondary hyperparathyroidism of renal origin: Secondary | ICD-10-CM | POA: Diagnosis not present

## 2023-02-17 DIAGNOSIS — N2581 Secondary hyperparathyroidism of renal origin: Secondary | ICD-10-CM | POA: Diagnosis not present

## 2023-02-17 DIAGNOSIS — E1122 Type 2 diabetes mellitus with diabetic chronic kidney disease: Secondary | ICD-10-CM | POA: Diagnosis not present

## 2023-02-17 DIAGNOSIS — M868X8 Other osteomyelitis, other site: Secondary | ICD-10-CM | POA: Diagnosis not present

## 2023-02-17 DIAGNOSIS — N186 End stage renal disease: Secondary | ICD-10-CM | POA: Diagnosis not present

## 2023-02-17 DIAGNOSIS — D631 Anemia in chronic kidney disease: Secondary | ICD-10-CM | POA: Diagnosis not present

## 2023-02-17 DIAGNOSIS — Z992 Dependence on renal dialysis: Secondary | ICD-10-CM | POA: Diagnosis not present

## 2023-02-20 DIAGNOSIS — N2581 Secondary hyperparathyroidism of renal origin: Secondary | ICD-10-CM | POA: Diagnosis not present

## 2023-02-20 DIAGNOSIS — D631 Anemia in chronic kidney disease: Secondary | ICD-10-CM | POA: Diagnosis not present

## 2023-02-20 DIAGNOSIS — M868X8 Other osteomyelitis, other site: Secondary | ICD-10-CM | POA: Diagnosis not present

## 2023-02-20 DIAGNOSIS — Z992 Dependence on renal dialysis: Secondary | ICD-10-CM | POA: Diagnosis not present

## 2023-02-20 DIAGNOSIS — E1122 Type 2 diabetes mellitus with diabetic chronic kidney disease: Secondary | ICD-10-CM | POA: Diagnosis not present

## 2023-02-20 DIAGNOSIS — N186 End stage renal disease: Secondary | ICD-10-CM | POA: Diagnosis not present

## 2023-02-21 DIAGNOSIS — Z992 Dependence on renal dialysis: Secondary | ICD-10-CM | POA: Diagnosis not present

## 2023-02-21 DIAGNOSIS — I129 Hypertensive chronic kidney disease with stage 1 through stage 4 chronic kidney disease, or unspecified chronic kidney disease: Secondary | ICD-10-CM | POA: Diagnosis not present

## 2023-02-21 DIAGNOSIS — N186 End stage renal disease: Secondary | ICD-10-CM | POA: Diagnosis not present

## 2023-02-22 ENCOUNTER — Other Ambulatory Visit (HOSPITAL_COMMUNITY): Payer: Self-pay

## 2023-02-22 ENCOUNTER — Encounter: Payer: Self-pay | Admitting: Family Medicine

## 2023-02-22 ENCOUNTER — Ambulatory Visit (INDEPENDENT_AMBULATORY_CARE_PROVIDER_SITE_OTHER): Payer: 59 | Admitting: Family Medicine

## 2023-02-22 VITALS — BP 130/86 | HR 86 | Temp 97.1°F | Ht 69.0 in | Wt 188.3 lb

## 2023-02-22 DIAGNOSIS — Z794 Long term (current) use of insulin: Secondary | ICD-10-CM | POA: Diagnosis not present

## 2023-02-22 DIAGNOSIS — M545 Low back pain, unspecified: Secondary | ICD-10-CM | POA: Diagnosis not present

## 2023-02-22 DIAGNOSIS — Z992 Dependence on renal dialysis: Secondary | ICD-10-CM | POA: Diagnosis not present

## 2023-02-22 DIAGNOSIS — N186 End stage renal disease: Secondary | ICD-10-CM | POA: Diagnosis not present

## 2023-02-22 DIAGNOSIS — D631 Anemia in chronic kidney disease: Secondary | ICD-10-CM | POA: Diagnosis not present

## 2023-02-22 DIAGNOSIS — E1142 Type 2 diabetes mellitus with diabetic polyneuropathy: Secondary | ICD-10-CM | POA: Diagnosis not present

## 2023-02-22 DIAGNOSIS — G8929 Other chronic pain: Secondary | ICD-10-CM | POA: Diagnosis not present

## 2023-02-22 DIAGNOSIS — I509 Heart failure, unspecified: Secondary | ICD-10-CM | POA: Diagnosis not present

## 2023-02-22 DIAGNOSIS — Z79899 Other long term (current) drug therapy: Secondary | ICD-10-CM | POA: Diagnosis not present

## 2023-02-22 DIAGNOSIS — F1190 Opioid use, unspecified, uncomplicated: Secondary | ICD-10-CM

## 2023-02-22 DIAGNOSIS — N2581 Secondary hyperparathyroidism of renal origin: Secondary | ICD-10-CM | POA: Diagnosis not present

## 2023-02-22 NOTE — Progress Notes (Signed)
Provider:  Jacalyn Lefevre, MD  Careteam: Patient Care Team: [name], MD as PCP - General (Family Medicine) [name], MD as PCP - Infectious Diseases (Infectious Diseases) [name], MD as PCP - Cardiology (Cardiology) [name], MD as Consulting Physician (Ophthalmology) [name], MD as Consulting Physician (Cardiology) [name], MD as Consulting Physician (Infectious Diseases) [name], DPM as Consulting Physician (Podiatry) [name], [name], NP as Nurse Practitioner (Family Medicine) [place], [facility], [facility]  PLACE OF SERVICE:  [facility]  Advanced Directive information    Allergies  Allergen Reactions   Augmentin [Amoxicillin-Pot Clavulanate] Diarrhea and Other (See Comments)    Severe diarrhea   Morphine Sulfate Anaphylaxis    Can't mix with oxycodone    Mucinex Fast-Max Other (See Comments)    Intense sweating    Amphetamines Other (See Comments)    Unknown reaction    Chief Complaint  Patient presents with   Medical Management of Chronic Issues    Patient presents today for a 6 month follow-up   Quality Metric Gaps    Eye exam, foot exam     HPI: Patient is a 72 y.o. male patient presents today for contractual visit regarding drug screens.  We can no longer do urine drug screens on this patient since he does not make urine on dialysis but we do a buccal swab to test for drugs other than what he is prescribed.  He continues to get oxycodone for his chronic back and leg pain.  He has been in compliance to the best of my knowledge with no other drugs found on drug screen. He continues to undergo dialysis 3 times a week. Overall he seems to be doing well.  Review of Systems:  Review of Systems  Constitutional: Negative.   HENT: Negative.    Respiratory: Negative.    Cardiovascular: Negative.   Genitourinary: Negative.   Musculoskeletal:  Positive for back pain and joint pain.   Neurological: Negative.   Psychiatric/Behavioral: Negative.    All other systems reviewed and are negative.   Past Medical History:  Diagnosis Date   Acute respiratory failure (HCC) 03/01/2018   Anemia    Arthritis    "all over; mostly knees and back" (02/28/2018)   Chronic combined systolic and diastolic CHF (congestive heart failure) (HCC)    Chronic lower back pain    stenosis   Community acquired pneumonia 09/06/2013   COPD (chronic obstructive pulmonary disease) (HCC)    Coronary atherosclerosis of native coronary artery    a. 02/2003 s/p CABG x 2 (VG->RI, VG->RPDA; b. 11/2019 PCI: LM nl, LAD 90d, D3 50, RI 100, LCX 100p, OM3 100 - fills via L->L collats from D2/dLAD, RCA 100p, VG->RPDA ok, VG->RI 95 (3.5x48 Synergy XD DES).   Drug abuse (HCC)    hx; tested for cocaine as recently as 2/08. says he is not using drugs now - avoided defib. for this reason    ESRD (end stage renal disease) (HCC)    Hemo M-W-F- [facility]   Fall at home 10/2020   GERD (gastroesophageal reflux disease)    takes OTC meds as needed   GI bleeding    a. 11/2019 EGD: angiodysplastic lesions w/ bleeding s/p argon plasma/clipping/epi inj. Multiple admissions for the same.   Glaucoma    uses eye drops daily   Hepatitis B 1968   "tx'd w/isolation; caught it from toilet stools in gym"   History of blood transfusion  03/01/2019   History of colon polyps    benign   History of gout    takes Allopurinol daily as well as Colchicine-if needed (02/28/2018)   History of kidney stones    HTN (hypertension)    takes Coreg,Imdur.and Apresoline daily   Human immunodeficiency virus (HIV) disease (HCC) dx'd 1995   on Biktarvy as of 12/2020.     Hyperlipidemia    Ischemic cardiomyopathy    a. 01/2019 Echo: EF 40-45%, diffuse HK, mild basal septal hypertrophy. Diast dysfxn. Nl RV size/fxn. Sev dil LA. Triv MR/TR/PR.   Muscle spasm    takes Zanaflex as needed   Myocardial infarction (HCC) ~ 2004/2005   Nocturia     Peripheral neuropathy    takes gabapentin daily   Pneumonia    "at least twice" (02/28/2018)   SDH (subdural hematoma) (HCC)    Syphilis, unspecified    Type II diabetes mellitus (HCC) 2004   Lantus daily.Average fasting blood sugar 125-199   Wears glasses    Wears partial dentures    Past Surgical History:  Procedure Laterality Date   AV FISTULA PLACEMENT Left 08/02/2018   Procedure: ARTERIOVENOUS (AV) FISTULA CREATION  left arm radiocephlic;  Surgeon: Cephus Shelling, MD;  Location: [facility] OR;  Service: Vascular;  Laterality: Left;   AV FISTULA PLACEMENT Left 08/01/2019   Procedure: LEFT BRACHIOCEPHALIC ARTERIOVENOUS (AV) FISTULA CREATION;  Surgeon: Larina Earthly, MD;  Location: [facility] OR;  Service: Vascular;  Laterality: Left;   AV FISTULA PLACEMENT Right 04/12/2022   Procedure: RIGHT UPPER EXTREMITY ARTERIOVENOUS (AV)  GRAFT INSERTION USING GORE STRETCH 4-7 MM;  Surgeon: Chuck Hint, MD;  Location[facility] OR;  Service: Vascular;  Laterality: Right;   BASCILIC VEIN TRANSPOSITION Left 10/03/2019   Procedure: BASILIC VEIN TRANSPOSITION LEFT SECOND STAGE;  Surgeon: Larina Earthly, MD;  Location: [facility] OR;  Service: Vascular;  Laterality: Left;   BIOPSY  01/25/2021   Procedure: BIOPSY;  Surgeon: Sherrilyn Rist, MD;  Location: [facility] ENDOSCOPY;  Service: Gastroenterology;;   CARDIAC CATHETERIZATION  10/2002; 12/19/2004   Hattie Perch 03/08/2011   COLONOSCOPY  2013   Dr.John Marina Goodell    CORONARY ARTERY BYPASS GRAFT  02/24/2003   CABG X2/notes 03/08/2011   CORONARY STENT INTERVENTION N/A 12/19/2019   Procedure: CORONARY STENT INTERVENTION;  Surgeon: Corky Crafts, MD;  Location: [facility] INVASIVE CV LAB;  Service: Cardiovascular;  Laterality: N/A;   CORONARY ULTRASOUND/IVUS N/A 12/19/2019   Procedure: Intravascular Ultrasound/IVUS;  Surgeon: Corky Crafts, MD;  Location: [facility] INVASIVE CV LAB;  Service: Cardiovascular;  Laterality: N/A;   ENTEROSCOPY N/A 01/25/2021   Procedure: ENTEROSCOPY;  Surgeon:  Sherrilyn Rist, MD;  Location: [facility] ENDOSCOPY;  Service: Gastroenterology;  Laterality: N/A;   ENTEROSCOPY N/A 02/13/2021   Procedure: ENTEROSCOPY;  Surgeon: Lynann Bologna, MD;  Location: [facility] ENDOSCOPY;  Service: Endoscopy;  Laterality: N/A;   ENTEROSCOPY N/A 05/07/2021   Procedure: ENTEROSCOPY;  Surgeon: Benancio Deeds, MD;  Location: [facility] ENDOSCOPY;  Service: Gastroenterology;  Laterality: N/A;   ESOPHAGOGASTRODUODENOSCOPY (EGD) WITH PROPOFOL N/A 02/08/2019   Procedure: ESOPHAGOGASTRODUODENOSCOPY (EGD) WITH PROPOFOL;  Surgeon: Rachael Fee, MD;  Location: [facility] ENDOSCOPY;  Service: Gastroenterology;  Laterality: N/A;   ESOPHAGOGASTRODUODENOSCOPY (EGD) WITH PROPOFOL N/A 12/22/2019   Procedure: ESOPHAGOGASTRODUODENOSCOPY (EGD) WITH PROPOFOL;  Surgeon: Shellia Cleverly, DO;  Location: [facility] ENDOSCOPY;  Service: Gastroenterology;  Laterality: N/A;   ESOPHAGOGASTRODUODENOSCOPY (EGD) WITH PROPOFOL N/A 10/19/2020   Procedure: ESOPHAGOGASTRODUODENOSCOPY (EGD) WITH PROPOFOL;  Surgeon: Lynann Bologna, MD;  Location: [facility] ENDOSCOPY;  Service: Endoscopy;  Laterality: N/A;   ESOPHAGOGASTRODUODENOSCOPY (EGD) WITH PROPOFOL N/A 12/22/2020   Procedure: ESOPHAGOGASTRODUODENOSCOPY (EGD) WITH PROPOFOL;  Surgeon: Iva Boop, MD;  Location: [facility] ENDOSCOPY;  Service: Endoscopy;  Laterality: N/A;   ESOPHAGOGASTRODUODENOSCOPY (EGD) WITH PROPOFOL N/A 01/09/2021   Procedure: ESOPHAGOGASTRODUODENOSCOPY (EGD) WITH PROPOFOL;  Surgeon: Hilarie Fredrickson, MD;  Location: [facility] ENDOSCOPY;  Service: Endoscopy;  Laterality: N/A;   HEMOSTASIS CLIP PLACEMENT  12/22/2019   Procedure: HEMOSTASIS CLIP PLACEMENT;  Surgeon: Shellia Cleverly, DO;  Location: [facility] ENDOSCOPY;  Service: Gastroenterology;;   HEMOSTASIS CLIP PLACEMENT  12/22/2020   Procedure: HEMOSTASIS CLIP PLACEMENT;  Surgeon: Iva Boop, MD;  Location: [facility] ENDOSCOPY;  Service: Endoscopy;;   HEMOSTASIS CONTROL  12/22/2020   Procedure: HEMOSTASIS CONTROL/hemospray;  Surgeon: Iva Boop, MD;  Location: [facility] ENDOSCOPY;  Service: Endoscopy;;   HOT HEMOSTASIS N/A 02/08/2019   Procedure: HOT HEMOSTASIS (ARGON PLASMA COAGULATION/BICAP);  Surgeon: Rachael Fee, MD;  Location: [facility] ENDOSCOPY;  Service: Gastroenterology;  Laterality: N/A;   HOT HEMOSTASIS N/A 12/22/2019   Procedure: HOT HEMOSTASIS (ARGON PLASMA COAGULATION/BICAP);  Surgeon: Shellia Cleverly, DO;  Location: [facility] ENDOSCOPY;  Service: Gastroenterology;  Laterality: N/A;   HOT HEMOSTASIS N/A 10/19/2020   Procedure: HOT HEMOSTASIS (ARGON PLASMA COAGULATION/BICAP);  Surgeon: Lynann Bologna, MD;  Location: [facility] ENDOSCOPY;  Service: Endoscopy;  Laterality: N/A;   HOT HEMOSTASIS N/A 12/22/2020   Procedure: HOT HEMOSTASIS (ARGON PLASMA COAGULATION/BICAP);  Surgeon: Iva Boop, MD;  Location: [facility] ENDOSCOPY;  Service: Endoscopy;  Laterality: N/A;   HOT HEMOSTASIS N/A 01/09/2021   Procedure: HOT HEMOSTASIS (ARGON PLASMA COAGULATION/BICAP);  Surgeon: Hilarie Fredrickson, MD;  Location: [facility] ENDOSCOPY;  Service: Endoscopy;  Laterality: N/A;   HOT HEMOSTASIS N/A 01/25/2021   Procedure: HOT HEMOSTASIS (ARGON PLASMA COAGULATION/BICAP);  Surgeon: Sherrilyn Rist, MD;  Location: [facility] ENDOSCOPY;  Service: Gastroenterology;  Laterality: N/A;   HOT HEMOSTASIS N/A 02/13/2021   Procedure: HOT HEMOSTASIS (ARGON PLASMA COAGULATION/BICAP);  Surgeon: Lynann Bologna, MD;  Location: [facility] ENDOSCOPY;  Service: Endoscopy;  Laterality: N/A;   HOT HEMOSTASIS N/A 05/07/2021   Procedure: HOT HEMOSTASIS (ARGON PLASMA COAGULATION/BICAP);  Surgeon: Benancio Deeds, MD;  Location: [facility] ENDOSCOPY;  Service: Gastroenterology;  Laterality: N/A;   INSERTION OF DIALYSIS CATHETER N/A 02/08/2022   Procedure: INSERTION OF TUNNELED DIALYSIS CATHETER;  Surgeon: Chuck Hint, MD;  Location: [facility] OR;  Service: Vascular;  Laterality: N/A;   INTERTROCHANTERIC HIP FRACTURE SURGERY Left 11/2006   Hattie Perch 03/08/2011   IR FLUORO GUIDE CV LINE LEFT  03/14/2022   IR FLUORO GUIDE CV  LINE RIGHT  07/24/2019   IR FLUORO GUIDE CV LINE RIGHT  07/30/2019   IR US GUIDE VASC ACCESS RIGHT  07/24/2019   IR US GUIDE VASC ACCESS RIGHT  07/30/2019   LAPAROSCOPIC CHOLECYSTECTOMY  05/2006   LIGATION OF ARTERIOVENOUS  FISTULA Left 02/08/2022   Procedure: LIGATION OF LEFT ARTERIOVENOUS  FISTULA;  Surgeon: Chuck Hint, MD;  Location: [facility] OR;  Service: Vascular;  Laterality: Left;   LIGATION OF COMPETING BRANCHES OF ARTERIOVENOUS FISTULA Left 11/05/2018   Procedure: LIGATION OF COMPETING BRANCHES OF ARTERIOVENOUS FISTULA  LEFT  ARM;  Surgeon: Cephus Shelling, MD;  Location: [facility] OR;  Service: Vascular;  Laterality: Left;   LUMBAR LAMINECTOMY/DECOMPRESSION MICRODISCECTOMY N/A 02/29/2016   Procedure: Left L4-5 Lateral Recess Decompression, Removal Extradural Intraspinal Facet Cyst;  Surgeon: Eldred Manges, MD;  Location: [facility] OR;  Service: Orthopedics;  Laterality: N/A;  METATARSAL HEAD EXCISION Right 07/20/2022   Procedure: Irragation and debridement of right foot wound with extraction of all necrotic soft tissue and bone;  Surgeon: Pilar Plate, DPM;  Location: [facility] OR;  Service: Podiatry;  Laterality: Right;  Right 4th Met head and base of proximal phalanx resection   MULTIPLE TOOTH EXTRACTIONS     ORIF MANDIBULAR FRACTURE Left 08/13/2004   ORIF of left body fracture mandible with KLS Martin 2.3-mm six hole/notes 03/08/2011   RIGHT/LEFT HEART CATH AND CORONARY/GRAFT ANGIOGRAPHY N/A 12/19/2019   Procedure: RIGHT/LEFT HEART CATH AND CORONARY/GRAFT ANGIOGRAPHY;  Surgeon: Corky Crafts, MD;  Location: [facility] INVASIVE CV LAB;  Service: Cardiovascular;  Laterality: N/A;   SCLEROTHERAPY  12/22/2019   Procedure: SCLEROTHERAPY;  Surgeon: Shellia Cleverly, DO;  Location: [facility] ENDOSCOPY;  Service: Gastroenterology;;   Susa Day  02/13/2021   Procedure: Susa Day;  Surgeon: Lynann Bologna, MD;  Location: [facility] ENDOSCOPY;  Service: Endoscopy;;   Social History:   reports that he has been  smoking cigarettes. He has a 21.50 pack-year smoking history. He has never been exposed to tobacco smoke. He has never used smokeless tobacco. He reports current alcohol use of about 12.0 standard drinks of alcohol per week. He reports that he does not currently use drugs after having used the following drugs: Cocaine.  Family History  Problem Relation Age of Onset   Heart failure Father    Hypertension Father    Diabetes Brother    Heart attack Brother    Alzheimer's disease Mother    Stroke Sister    Diabetes Sister    Alzheimer's disease Sister    Hypertension Brother    Diabetes Brother    Drug abuse Brother    Colon cancer Neg Hx     Medications: Patient's Medications  New Prescriptions   No medications on file  Previous Medications   ACETAMINOPHEN (TYLENOL) 650 MG CR TABLET    Take 1,300 mg by mouth 3 (three) times daily.   ALLOPURINOL (ZYLOPRIM) 100 MG TABLET    Take one tablet by mouth once daily.   ATORVASTATIN (LIPITOR) 10 MG TABLET    TAKE 1 TABLET(10 MG) BY MOUTH DAILY   B-D UF III MINI PEN NEEDLES 31G X 5 MM MISC    USE 4 TIMES DAILY   BIKTARVY 50-200-25 MG TABS TABLET    Take 1 tablet by mouth daily.   CALCIUM ACETATE 667 MG TABS    Take 667-2,001 mg by mouth See admin instructions. Take 2001 mg by mouth three times a day with meals and 667 mg with each snack   CARVEDILOL (COREG) 3.125 MG TABLET    Take 1 tablet (3.125 mg total) by mouth 2 (two) times daily with a meal.   DICLOFENAC SODIUM (VOLTAREN) 1 % GEL    Apply 2 grams topically to the affected area three times daily as needed for pain.   FOLIC ACID (FOLVITE) 1 MG TABLET    TAKE 1 TABLET(1 MG) BY MOUTH DAILY   GABAPENTIN (NEURONTIN) 300 MG CAPSULE    TAKE 1 CAPSULE BY MOUTH DAILY.   GENTAMICIN OINTMENT (GARAMYCIN) 0.1 %    Apply 1 Application topically daily.   GLUCOSE BLOOD (ONETOUCH ULTRA) TEST STRIP    Check blood sugar three times daily E11.40   INSULIN LISPRO (HUMALOG) 100 UNIT/ML KWIKPEN    INJECT 20 UNITS  UNDER THE SKIN THREE TIMES DAILY AS NEEDED FOR HIGH BLOOD SUGAR(ABOVE 150)   ISOSORBIDE MONONITRATE (IMDUR) 30 MG 24 HR TABLET  Take 1 tablet (30 mg total) by mouth daily.   KETOCONAZOLE (NIZORAL) 2 % CREAM    Apply 1 Application topically daily.   LANCETS (ONETOUCH DELICA PLUS LANCET33G) MISC    USE 3 TIMES DAILY   LATANOPROST (XALATAN) 0.005 % OPHTHALMIC SOLUTION    Place 1 drop into both eyes at bedtime.   LIDOCAINE-PRILOCAINE (EMLA) CREAM    Apply 1 application topically every Monday, Wednesday, and Friday with hemodialysis.   MENTHOL-CAMPHOR (TIGER BALM ARTHRITIS RUB) 11-11 % CREA    Apply 1 Application topically daily as needed (pain).   MULTIVITAMIN (RENA-VIT) TABS TABLET    Take 1 tablet by mouth daily.   NITROGLYCERIN (NITROSTAT) 0.3 MG SL TABLET    PLACE 1 TABLET UNDER THE TONGUE AS NEEDED FOR CHEST PAIN   OCTREOTIDE (SANDOSTATIN LAR DEPOT) 10 MG INJECTION    Inject 10 mg into the muscle every 28 days.   ONDANSETRON (ZOFRAN) 4 MG TABLET    TAKE 1 TABLET(4 MG) BY MOUTH EVERY 8 HOURS AS NEEDED FOR NAUSEA OR VOMITING   OXYCODONE-ACETAMINOPHEN (PERCOCET) 5-325 MG TABLET    Take 1 tablet by mouth every 6 (six) hours as needed for severe pain.   TIMOLOL (TIMOPTIC) 0.5 % OPHTHALMIC SOLUTION    Place 1 drop into both eyes in the morning.   UMECLIDINIUM-VILANTEROL (ANORO ELLIPTA) 62.5-25 MCG/ACT AEPB    INHALE 1 PUFF BY MOUTH EVERY DAY  Modified Medications   No medications on file  Discontinued Medications   No medications on file    Physical Exam:  Vitals:   02/22/23 1337  BP: 130/86  Pulse: 86  Temp: (!) 97.1 F (36.2 C)  SpO2: 98%  Weight: 188 lb 4.8 oz (85.4 kg)  Height: 5\' 9"  (1.753 m)   Body mass index is 27.81 kg/m. Wt Readings from Last 3 Encounters:  02/22/23 188 lb 4.8 oz (85.4 kg)  02/14/23 193 lb (87.5 kg)  02/02/23 182 lb (82.6 kg)    Physical Exam Vitals and nursing note reviewed.  Constitutional:      Appearance: Normal appearance.  Cardiovascular:      Rate and Rhythm: Normal rate and regular rhythm.  Pulmonary:     Effort: Pulmonary effort is normal.     Breath sounds: Normal breath sounds.  Abdominal:     Palpations: Abdomen is soft.  Skin:    General: Skin is warm and dry.  Neurological:     General: No focal deficit present.     Mental Status: He is alert and oriented to person, place, and time.     Labs reviewed: Basic Metabolic Panel: Recent Labs    07/18/22 1715 07/19/22 0336 07/20/22 0601 07/20/22 1703 08/09/22 1625 09/02/22 0000 09/30/22 0259 10/01/22 0444  NA  --    < > 136   < > 136 140 136 137  K  --    < > 4.6   < > 5.5* 5.3* 5.4* 4.7  CL  --    < > 93*   < > 90* 98* 91* 93*  CO2  --    < > 23  --  27  --  29 28  GLUCOSE  --    < > 145*   < > 74  --  186* 127*  BUN  --    < > 77*   < > 44* 61* 98* 53*  CREATININE 15.58*   < > 10.73*   < > 8.44* 10.3* 10.17* 6.87*  CALCIUM  --    < >  8.8*  --  9.7  --  9.8 8.9  MG 2.7*  --  2.0  --   --   --   --   --   PHOS  --   --  6.9*  --   --   --   --   --    < > = values in this interval not displayed.   Liver Function Tests: Recent Labs    07/18/22 1240 07/19/22 0336 07/20/22 0601 09/30/22 0259  AST 34 33  --  20  ALT 23 22  --  11  ALKPHOS 88 100  --  63  BILITOT 0.7 1.2  --  0.6  PROT 7.6 7.7  --  7.2  ALBUMIN 3.1* 3.1* 2.9* 3.1*   Recent Labs    07/18/22 1240  LIPASE 31   No results for input(s): "AMMONIA" in the last 8760 hours. CBC: Recent Labs    07/18/22 1240 07/18/22 1715 08/09/22 1625 09/02/22 0000 09/30/22 0259 10/01/22 0444  WBC 7.4   < > 6.1 4.9 5.6 5.2  NEUTROABS 4.2  --   --   --  3.4  --   HGB 9.5*   < > 9.3* 29.1* 10.0* 10.8*  HCT 29.9*   < > 28.6* 32* 31.4* 33.2*  MCV 106.8*   < > 104*  --  107.5* 103.8*  PLT 195   < > 129* 119* 112* 113*   < > = values in this interval not displayed.   Lipid Panel: Recent Labs    02/24/22 0927  CHOL 154  HDL 34*  LDLCALC 80  TRIG 409*  CHOLHDL 4.5   TSH: No results for  input(s): "TSH" in the last 8760 hours. A1C: Lab Results  Component Value Date   HGBA1C 6.4 08/03/2022     Assessment/Plan  1. Opioid use Patient had been to pain clinic but was not treated well there.  I then consented to give him opioids if he would abide by the restrictions of the drug contract and so far he has done so  2. Congestive heart failure, unspecified HF chronicity, unspecified heart failure type (HCC) Well compensated at this time lungs are clear there is no dependent edema.  No symptoms of shortness of breath or gallop  3. Chronic bilateral low back pain without sciatica Back pain persists but is controlled by oxycodone which she has been taking 3 to 4/day  4. Type 2 diabetes mellitus with diabetic polyneuropathy, with long-term current use of insulin (HCC) Last A1c was 6.4.  He continues with Humalog 20 units 3 times daily  5. ESRD on hemodialysis [facility]) Things seem to be going well with dialysis.  He is tolerating this well despite general hardships of spending most of 3 days a week in dialysis   Jacalyn Lefevre, MD [facility] [phone]

## 2023-02-23 ENCOUNTER — Other Ambulatory Visit (HOSPITAL_COMMUNITY): Payer: Self-pay

## 2023-02-23 ENCOUNTER — Other Ambulatory Visit: Payer: Self-pay

## 2023-02-24 ENCOUNTER — Other Ambulatory Visit (HOSPITAL_COMMUNITY): Payer: Self-pay

## 2023-02-24 DIAGNOSIS — Z992 Dependence on renal dialysis: Secondary | ICD-10-CM | POA: Diagnosis not present

## 2023-02-24 DIAGNOSIS — N2581 Secondary hyperparathyroidism of renal origin: Secondary | ICD-10-CM | POA: Diagnosis not present

## 2023-02-24 DIAGNOSIS — N186 End stage renal disease: Secondary | ICD-10-CM | POA: Diagnosis not present

## 2023-02-24 DIAGNOSIS — D631 Anemia in chronic kidney disease: Secondary | ICD-10-CM | POA: Diagnosis not present

## 2023-02-25 LAB — DRUG TOX MONITOR 1 W/CONF, ORAL FLD
Amphetamines: NEGATIVE ng/mL (ref <10)
Barbiturates: NEGATIVE ng/mL (ref <10)
Benzodiazepines: NEGATIVE ng/mL (ref <0.50)
Buprenorphine: NEGATIVE ng/mL (ref <0.10)
Cocaine: NEGATIVE ng/mL (ref <5.0)
Codeine: NEGATIVE ng/mL (ref <2.5)
Cotinine: 212.5 ng/mL — ABNORMAL HIGH (ref <5.0)
Dihydrocodeine: NEGATIVE ng/mL (ref <2.5)
Fentanyl: NEGATIVE ng/mL (ref <0.10)
Heroin Metabolite: NEGATIVE ng/mL (ref <1.0)
Hydrocodone: NEGATIVE ng/mL (ref <2.5)
Hydromorphone: NEGATIVE ng/mL (ref <2.5)
MARIJUANA: NEGATIVE ng/mL (ref <2.5)
MDMA: NEGATIVE ng/mL (ref <10)
Meprobamate: NEGATIVE ng/mL (ref <2.5)
Methadone: NEGATIVE ng/mL (ref <5.0)
Morphine: NEGATIVE ng/mL (ref <2.5)
Nicotine Metabolite: POSITIVE ng/mL — AB (ref <5.0)
Norhydrocodone: NEGATIVE ng/mL (ref <2.5)
Noroxycodone: 13.4 ng/mL — ABNORMAL HIGH (ref <2.5)
Opiates: POSITIVE ng/mL — AB (ref <2.5)
Oxycodone: 145.7 ng/mL — ABNORMAL HIGH (ref <2.5)
Oxymorphone: NEGATIVE ng/mL (ref <2.5)
Phencyclidine: NEGATIVE ng/mL (ref <10)
Tapentadol: NEGATIVE ng/mL (ref <5.0)
Tramadol: NEGATIVE ng/mL (ref <5.0)
Zolpidem: NEGATIVE ng/mL (ref <5.0)

## 2023-02-27 ENCOUNTER — Other Ambulatory Visit: Payer: Self-pay

## 2023-02-27 DIAGNOSIS — N186 End stage renal disease: Secondary | ICD-10-CM | POA: Diagnosis not present

## 2023-02-27 DIAGNOSIS — Z992 Dependence on renal dialysis: Secondary | ICD-10-CM | POA: Diagnosis not present

## 2023-02-27 DIAGNOSIS — D631 Anemia in chronic kidney disease: Secondary | ICD-10-CM | POA: Diagnosis not present

## 2023-02-27 DIAGNOSIS — N2581 Secondary hyperparathyroidism of renal origin: Secondary | ICD-10-CM | POA: Diagnosis not present

## 2023-02-28 ENCOUNTER — Ambulatory Visit: Payer: Self-pay

## 2023-02-28 NOTE — Patient Outreach (Signed)
Care Coordination   Follow Up Visit Note   02/28/2023 Name: Jacob [name] MRN: [id number] DOB: 18-Nov-1950  Jacob [name] is a 72 y.o. year old male who sees [name], MD for primary care. I spoke with  David Stall by phone today.  What matters to the patients health and wellness today?  Patient would like to get some relief with his diabetic neuropathy.     Goals Addressed               This Visit's Progress     Patient Stated     I am having pain from a recent fall (pt-stated)        Care Coordination Interventions: Reviewed provider established plan for pain management Discussed importance of adherence to all scheduled medical appointments Counseled on the importance of reporting any/all new or changed pain symptoms or management strategies to pain management provider Advised patient to report to care team affect of pain on daily activities Reviewed with patient prescribed pharmacological and nonpharmacological pain relief strategies      COMPLETED: I had a fall in September (pt-stated)        Care Coordination Interventions: Assessed for potential causes of falls and Fall prevention strategies Advised patient of importance of notifying provider of falls Assessed for falls since last encounter Determined patient reports having no further falls at this time, he feels his back pain is better managed since PCP took over his pain management and this has prevented additional falls from occurring  Assessed patients knowledge of fall risk prevention secondary to previously provided education Advised patient to discuss referral for PT in the future if needed with provider      I need help transferring to another dialysis center (pt-stated)        Care Coordination Interventions: Evaluation of current treatment plan related to ESRD and patient's adherence to plan as established by provider Discussed with patient that he continues to be on the wait list for a dialysis  center transfer to his desired location Discussed patient was given an option to transfer to [facility], unfortunately his schedule would change and he did not wish to change his schedule at this time Determined patient feels his treatments are going well for the time being       Other     To get relief from neuropathy in fingertips        Care Coordination Interventions: Evaluation of current treatment plan related to diabetic neuropathy  and patient's adherence to plan as established by provider Determined patient is experiencing worsening symptoms related to Neuropathy in his fingertips, worse on the right hand Educated patient about the basic disease process related to diabetic neuropathy Reviewed medications with patient and discussed importance of medication adherence Determined patient is not finding Gabapentin to be effective for his symptoms  Counseled on the importance of reporting any/all new or changed symptoms or management strategies to current treatment management provider Reviewed with patient prescribed pharmacological and nonpharmacological pain relief strategies Sent in basket message to PCP, Dr. Jacalyn Lefevre advising of patient's worsening neuropathy and requested a change with Gabapentin to Pregabalin be considered as patient is agreeable and reports Gabapentin is not effective         SDOH assessments and interventions completed:  No     Care Coordination Interventions:  Yes, provided   Follow up plan: Follow up call scheduled for 03/14/23 @09 :00 AM    Encounter Outcome:  Pt. Visit Completed

## 2023-02-28 NOTE — Patient Instructions (Signed)
Visit Information  Thank you for taking time to visit with me today. Please don't hesitate to contact me if I can be of assistance to you.   Following are the goals we discussed today:   Goals Addressed               This Visit's Progress     Patient Stated     I am having pain from a recent fall (pt-stated)        Care Coordination Interventions: Reviewed provider established plan for pain management Discussed importance of adherence to all scheduled medical appointments Counseled on the importance of reporting any/all new or changed pain symptoms or management strategies to pain management provider Advised patient to report to care team affect of pain on daily activities Reviewed with patient prescribed pharmacological and nonpharmacological pain relief strategies      COMPLETED: I had a fall in September (pt-stated)        Care Coordination Interventions: Assessed for potential causes of falls and Fall prevention strategies Advised patient of importance of notifying provider of falls Assessed for falls since last encounter Determined patient reports having no further falls at this time, he feels his back pain is better managed since PCP took over his pain management and this has prevented additional falls from occurring  Assessed patients knowledge of fall risk prevention secondary to previously provided education Advised patient to discuss referral for PT in the future if needed with provider      I need help transferring to another dialysis center (pt-stated)        Care Coordination Interventions: Evaluation of current treatment plan related to ESRD and patient's adherence to plan as established by provider Discussed with patient that he continues to be on the wait list for a dialysis center transfer to his desired location Discussed patient was given an option to transfer to [facility], unfortunately his schedule would change and he did not wish to change his schedule at  this time Determined patient feels his treatments are going well for the time being       Other     To get relief from neuropathy in fingertips        Care Coordination Interventions: Evaluation of current treatment plan related to diabetic neuropathy  and patient's adherence to plan as established by provider Determined patient is experiencing worsening symptoms related to Neuropathy in his fingertips, worse on the right hand Educated patient about the basic disease process related to diabetic neuropathy Reviewed medications with patient and discussed importance of medication adherence Determined patient is not finding Gabapentin to be effective for his symptoms  Counseled on the importance of reporting any/all new or changed symptoms or management strategies to current treatment management provider Reviewed with patient prescribed pharmacological and nonpharmacological pain relief strategies Sent in basket message to PCP, Dr. Jacalyn Lefevre advising of patient's worsening neuropathy and requested a change with Gabapentin to Pregabalin be considered as patient is agreeable and reports Gabapentin is not effective          Our next appointment is by telephone on 03/14/23 at 09:00 AM  Please call the care guide team at [phone] if you need to cancel or reschedule your appointment.   If you are experiencing a Mental Health or Behavioral Health Crisis or need someone to talk to, please call 1-800-273-TALK (toll free, 24 hour hotline) go to [facility] [address] [place] [phone])  Patient verbalizes understanding of instructions  and care plan provided today and agrees to view in MyChart. Active MyChart status and patient understanding of how to access instructions and care plan via MyChart confirmed with patient.     Delsa Sale, RN, BSN, CCM Care Management Coordinator [facility] Care Management  Direct Phone: [phone]

## 2023-03-01 DIAGNOSIS — N186 End stage renal disease: Secondary | ICD-10-CM | POA: Diagnosis not present

## 2023-03-01 DIAGNOSIS — N2581 Secondary hyperparathyroidism of renal origin: Secondary | ICD-10-CM | POA: Diagnosis not present

## 2023-03-01 DIAGNOSIS — Z992 Dependence on renal dialysis: Secondary | ICD-10-CM | POA: Diagnosis not present

## 2023-03-01 DIAGNOSIS — D631 Anemia in chronic kidney disease: Secondary | ICD-10-CM | POA: Diagnosis not present

## 2023-03-02 ENCOUNTER — Other Ambulatory Visit: Payer: Self-pay

## 2023-03-02 ENCOUNTER — Ambulatory Visit (HOSPITAL_COMMUNITY)
Admission: RE | Admit: 2023-03-02 | Discharge: 2023-03-02 | Disposition: A | Discharge status: Home / Self Care | Payer: 59 | Source: Ambulatory Visit | Attending: Gastroenterology | Admitting: Gastroenterology

## 2023-03-02 DIAGNOSIS — Q2730 Arteriovenous malformation, site unspecified: Secondary | ICD-10-CM | POA: Insufficient documentation

## 2023-03-02 DIAGNOSIS — M109 Gout, unspecified: Secondary | ICD-10-CM

## 2023-03-02 MED ORDER — OCTREOTIDE ACETATE 20 MG IM KIT
10.0000 mg | PACK | INTRAMUSCULAR | Status: DC
  Administered 2023-03-02: 10 mg via INTRAMUSCULAR
  Filled 2023-03-02: qty 1

## 2023-03-02 MED ORDER — ALLOPURINOL 100 MG PO TABS: ORAL_TABLET | ORAL | 2 refills | Status: DC

## 2023-03-03 DIAGNOSIS — N2581 Secondary hyperparathyroidism of renal origin: Secondary | ICD-10-CM | POA: Diagnosis not present

## 2023-03-03 DIAGNOSIS — Z992 Dependence on renal dialysis: Secondary | ICD-10-CM | POA: Diagnosis not present

## 2023-03-03 DIAGNOSIS — N186 End stage renal disease: Secondary | ICD-10-CM | POA: Diagnosis not present

## 2023-03-03 DIAGNOSIS — D631 Anemia in chronic kidney disease: Secondary | ICD-10-CM | POA: Diagnosis not present

## 2023-03-06 DIAGNOSIS — D631 Anemia in chronic kidney disease: Secondary | ICD-10-CM | POA: Diagnosis not present

## 2023-03-06 DIAGNOSIS — N186 End stage renal disease: Secondary | ICD-10-CM | POA: Diagnosis not present

## 2023-03-06 DIAGNOSIS — N2581 Secondary hyperparathyroidism of renal origin: Secondary | ICD-10-CM | POA: Diagnosis not present

## 2023-03-06 DIAGNOSIS — Z992 Dependence on renal dialysis: Secondary | ICD-10-CM | POA: Diagnosis not present

## 2023-03-07 ENCOUNTER — Encounter: Payer: Self-pay | Admitting: Internal Medicine

## 2023-03-07 ENCOUNTER — Other Ambulatory Visit: Payer: Self-pay

## 2023-03-07 ENCOUNTER — Ambulatory Visit (INDEPENDENT_AMBULATORY_CARE_PROVIDER_SITE_OTHER): Payer: 59 | Admitting: Internal Medicine

## 2023-03-07 VITALS — BP 109/67 | HR 81 | Temp 97.3°F | Wt 189.0 lb

## 2023-03-07 DIAGNOSIS — Z716 Tobacco abuse counseling: Secondary | ICD-10-CM | POA: Insufficient documentation

## 2023-03-07 DIAGNOSIS — B20 Human immunodeficiency virus [HIV] disease: Secondary | ICD-10-CM | POA: Diagnosis not present

## 2023-03-07 DIAGNOSIS — F17200 Nicotine dependence, unspecified, uncomplicated: Secondary | ICD-10-CM

## 2023-03-07 DIAGNOSIS — Z113 Encounter for screening for infections with a predominantly sexual mode of transmission: Secondary | ICD-10-CM | POA: Diagnosis not present

## 2023-03-07 DIAGNOSIS — I10 Essential (primary) hypertension: Secondary | ICD-10-CM | POA: Diagnosis not present

## 2023-03-07 NOTE — Progress Notes (Signed)
   Subjective:    [name], male    DOB: 04-23-1951, 72 y.o.   MRN: [id number]  HPI Jacob [name] is here for follow up of HIV He continues on [place] and has had no issues with getting or taking it.  He has no complaints today.  He continues on dialysis.  No concerns.     Review of Systems  Constitutional:  Negative for fatigue.  Gastrointestinal:  Negative for diarrhea and nausea.  Skin:  Negative for rash.       Objective:   Physical Exam Eyes:     General: No scleral icterus. Pulmonary:     Effort: Pulmonary effort is normal.  Neurological:     Mental Status: He is alert.    SH: + tobacco       Assessment & Plan:

## 2023-03-07 NOTE — Assessment & Plan Note (Signed)
Counseled on quitting °

## 2023-03-07 NOTE — Assessment & Plan Note (Signed)
He continues to do well, no concerns and no issues with Biktarvy.  No indication for any change and will do labs today. Rtc in 6 months.

## 2023-03-07 NOTE — Assessment & Plan Note (Signed)
Blood pressure noted and wnl.  Will continue to monitor.

## 2023-03-08 DIAGNOSIS — D631 Anemia in chronic kidney disease: Secondary | ICD-10-CM | POA: Diagnosis not present

## 2023-03-08 DIAGNOSIS — Z992 Dependence on renal dialysis: Secondary | ICD-10-CM | POA: Diagnosis not present

## 2023-03-08 DIAGNOSIS — N2581 Secondary hyperparathyroidism of renal origin: Secondary | ICD-10-CM | POA: Diagnosis not present

## 2023-03-08 DIAGNOSIS — N186 End stage renal disease: Secondary | ICD-10-CM | POA: Diagnosis not present

## 2023-03-09 ENCOUNTER — Ambulatory Visit: Payer: 59 | Admitting: Podiatry

## 2023-03-09 DIAGNOSIS — L97514 Non-pressure chronic ulcer of other part of right foot with necrosis of bone: Secondary | ICD-10-CM

## 2023-03-09 DIAGNOSIS — M2012 Hallux valgus (acquired), left foot: Secondary | ICD-10-CM | POA: Diagnosis not present

## 2023-03-09 DIAGNOSIS — E1142 Type 2 diabetes mellitus with diabetic polyneuropathy: Secondary | ICD-10-CM | POA: Diagnosis not present

## 2023-03-09 DIAGNOSIS — M2011 Hallux valgus (acquired), right foot: Secondary | ICD-10-CM | POA: Diagnosis not present

## 2023-03-09 NOTE — Progress Notes (Signed)
Patient presents today to pick up diabetic shoes and insoles.  Patient was dispensed 1 pair of diabetic shoes and 3 pairs of foam casted diabetic insoles.   He tried on the shoes with the insoles and the fit was satisfactory.   Will follow up next year for new order.     Patient seen on the casting schedule

## 2023-03-10 ENCOUNTER — Other Ambulatory Visit: Payer: Self-pay

## 2023-03-10 DIAGNOSIS — Z992 Dependence on renal dialysis: Secondary | ICD-10-CM | POA: Diagnosis not present

## 2023-03-10 DIAGNOSIS — D631 Anemia in chronic kidney disease: Secondary | ICD-10-CM | POA: Diagnosis not present

## 2023-03-10 DIAGNOSIS — N2581 Secondary hyperparathyroidism of renal origin: Secondary | ICD-10-CM | POA: Diagnosis not present

## 2023-03-10 DIAGNOSIS — N186 End stage renal disease: Secondary | ICD-10-CM | POA: Diagnosis not present

## 2023-03-10 MED ORDER — ONETOUCH ULTRA VI STRP: ORAL_STRIP | 3 refills | Status: DC

## 2023-03-11 LAB — HIV-1 RNA QUANT-NO REFLEX-BLD
HIV 1 RNA Quant: NOT DETECTED Copies/mL
HIV-1 RNA Quant, Log: NOT DETECTED Log cps/mL

## 2023-03-13 DIAGNOSIS — Z992 Dependence on renal dialysis: Secondary | ICD-10-CM | POA: Diagnosis not present

## 2023-03-13 DIAGNOSIS — N186 End stage renal disease: Secondary | ICD-10-CM | POA: Diagnosis not present

## 2023-03-13 DIAGNOSIS — D631 Anemia in chronic kidney disease: Secondary | ICD-10-CM | POA: Diagnosis not present

## 2023-03-13 DIAGNOSIS — N2581 Secondary hyperparathyroidism of renal origin: Secondary | ICD-10-CM | POA: Diagnosis not present

## 2023-03-14 ENCOUNTER — Telehealth: Payer: Self-pay

## 2023-03-14 ENCOUNTER — Ambulatory Visit: Payer: Self-pay

## 2023-03-14 NOTE — Patient Instructions (Signed)
Visit Information  Thank you for taking time to visit with me today. Please don't hesitate to contact me if I can be of assistance to you.   Following are the goals we discussed today:   Goals Addressed             This Visit's Progress    To get relief from neuropathy in fingertips       Care Coordination Interventions: Evaluation of current treatment plan related to diabetic neuropathy  and patient's adherence to plan as established by provider Determined patient continues to experience symptoms of neuropathy in his finger tips Discussed with patient his PCP did not respond to my request to change his Gabapentin to Pregabalin  Determined patient is not finding Gabapentin to be effective, he is wearing hand splints as directed  Placed outbound call to [facility], left a voice message advising of the purpose of my call and requested a return call to determined if PCP is agreeable to switching current treatment to Pregabalin         Our next appointment is by telephone on 04/11/23 at 11:00 AM  Please call the care guide team at [phone] if you need to cancel or reschedule your appointment.   If you are experiencing a Mental Health or Behavioral Health Crisis or need someone to talk to, please call 1-800-273-TALK (toll free, 24 hour hotline)  The patient verbalized understanding of instructions, educational materials, and care plan provided today and DECLINED offer to receive copy of patient instructions, educational materials, and care plan.   Delsa Sale, RN, BSN, CCM Care Management Coordinator [facility] Care Management Direct Phone: [phone]

## 2023-03-14 NOTE — Telephone Encounter (Signed)
Delsa Sale, Nurse care coordinator with [facility], left message on clinical intake voicemail stating that patient is wanting to switch from gabapentin to pregabalin. She stated that patient said gabapentin no longer works for his neuropathy pain.  Message sent to Dr. Jacalyn Lefevre

## 2023-03-14 NOTE — Patient Outreach (Signed)
  Care Coordination   Follow Up Visit Note   03/14/2023 Name: Jacob [name] MRN: [id number] DOB: 1951/03/13  Jacob [name] is a 72 y.o. year old male who sees [name], MD for primary care. I spoke with  Jacob [name] by phone today.  What matters to the patients health and wellness today?  Patient would like to get relief from his diabetic neuropathy.     Goals Addressed             This Visit's Progress    To get relief from neuropathy in fingertips       Care Coordination Interventions: Evaluation of current treatment plan related to diabetic neuropathy  and patient's adherence to plan as established by provider Determined patient continues to experience symptoms of neuropathy in his finger tips Discussed with patient his PCP did not respond to my request to change his Gabapentin to Pregabalin  Determined patient is not finding Gabapentin to be effective, he is wearing hand splints as directed  Placed outbound call to [facility], left a voice message advising of the purpose of my call and requested a return call to determined if PCP is agreeable to switching current treatment to Pregabalin     Interventions Today    Flowsheet Row Most Recent Value  Chronic Disease   Chronic disease during today's visit Diabetes, Chronic Kidney Disease/End Stage Renal Disease (ESRD), Other  [carpal tunnel,  diabetic neuropathy]  General Interventions   General Interventions Discussed/Reviewed General Interventions Discussed, General Interventions Reviewed, Doctor Visits, Labs, Communication with  Doctor Visits Discussed/Reviewed Doctor Visits Discussed, Doctor Visits Reviewed, PCP  Communication with RN  [Dr. Miller]  Education Interventions   Education Provided Provided Education  Provided Verbal Education On Medication, Blood Sugar Monitoring  Pharmacy Interventions   Pharmacy Dicussed/Reviewed Medications and their functions, Pharmacy Topics Discussed, Pharmacy  Topics Reviewed          SDOH assessments and interventions completed:  No     Care Coordination Interventions:  Yes, provided   Follow up plan: Follow up call scheduled for 04/11/23 @11 :00 AM    Encounter Outcome:  Pt. Visit Completed

## 2023-03-15 ENCOUNTER — Telehealth: Payer: Self-pay | Admitting: *Deleted

## 2023-03-15 DIAGNOSIS — Z992 Dependence on renal dialysis: Secondary | ICD-10-CM | POA: Diagnosis not present

## 2023-03-15 DIAGNOSIS — D631 Anemia in chronic kidney disease: Secondary | ICD-10-CM | POA: Diagnosis not present

## 2023-03-15 DIAGNOSIS — N2581 Secondary hyperparathyroidism of renal origin: Secondary | ICD-10-CM | POA: Diagnosis not present

## 2023-03-15 DIAGNOSIS — N186 End stage renal disease: Secondary | ICD-10-CM | POA: Diagnosis not present

## 2023-03-15 NOTE — Telephone Encounter (Signed)
I do not think the pregalbin is better but has more side effects. The gabapentin likely needs to be titrated up usually

## 2023-03-15 NOTE — Telephone Encounter (Signed)
error 

## 2023-03-17 ENCOUNTER — Telehealth: Payer: Self-pay

## 2023-03-17 DIAGNOSIS — Z992 Dependence on renal dialysis: Secondary | ICD-10-CM | POA: Diagnosis not present

## 2023-03-17 DIAGNOSIS — N2581 Secondary hyperparathyroidism of renal origin: Secondary | ICD-10-CM | POA: Diagnosis not present

## 2023-03-17 DIAGNOSIS — D631 Anemia in chronic kidney disease: Secondary | ICD-10-CM | POA: Diagnosis not present

## 2023-03-17 DIAGNOSIS — N186 End stage renal disease: Secondary | ICD-10-CM | POA: Diagnosis not present

## 2023-03-17 NOTE — Telephone Encounter (Signed)
Patient returned call and sttaed that I could void request from [facility]

## 2023-03-17 NOTE — Telephone Encounter (Signed)
Left message on voicemail for patient to return when available. Reason for call - question fax received from [facility] for One touch test strips (rx was sent to local pharmacy recent). Awaiting return call

## 2023-03-20 DIAGNOSIS — Z992 Dependence on renal dialysis: Secondary | ICD-10-CM | POA: Diagnosis not present

## 2023-03-20 DIAGNOSIS — D631 Anemia in chronic kidney disease: Secondary | ICD-10-CM | POA: Diagnosis not present

## 2023-03-20 DIAGNOSIS — N2581 Secondary hyperparathyroidism of renal origin: Secondary | ICD-10-CM | POA: Diagnosis not present

## 2023-03-20 DIAGNOSIS — N186 End stage renal disease: Secondary | ICD-10-CM | POA: Diagnosis not present

## 2023-03-21 ENCOUNTER — Telehealth: Payer: Self-pay

## 2023-03-21 NOTE — Telephone Encounter (Signed)
Patient called and states that Lima from [facility] wants patient medication Gabapentin to be changed to something else. She states that patient neuropathy is getting worse. Message routed to PCP Hyacinth Meeker Bertram Millard, MD

## 2023-03-21 NOTE — Telephone Encounter (Signed)
Pt has appt this week; will address then

## 2023-03-22 DIAGNOSIS — N186 End stage renal disease: Secondary | ICD-10-CM | POA: Diagnosis not present

## 2023-03-22 DIAGNOSIS — Z992 Dependence on renal dialysis: Secondary | ICD-10-CM | POA: Diagnosis not present

## 2023-03-22 DIAGNOSIS — N2581 Secondary hyperparathyroidism of renal origin: Secondary | ICD-10-CM | POA: Diagnosis not present

## 2023-03-22 DIAGNOSIS — D631 Anemia in chronic kidney disease: Secondary | ICD-10-CM | POA: Diagnosis not present

## 2023-03-23 ENCOUNTER — Other Ambulatory Visit (HOSPITAL_COMMUNITY): Payer: Self-pay

## 2023-03-23 ENCOUNTER — Telehealth: Payer: Self-pay

## 2023-03-23 NOTE — Telephone Encounter (Signed)
Patient returned the phone call asking about the status of the gabapentin being changed to something different. [name], MD Patient states he would not like an appointment at this time but would like to know what's the next steps. He was not scheduled this week for an appointment

## 2023-03-24 ENCOUNTER — Other Ambulatory Visit: Payer: Self-pay

## 2023-03-24 DIAGNOSIS — I129 Hypertensive chronic kidney disease with stage 1 through stage 4 chronic kidney disease, or unspecified chronic kidney disease: Secondary | ICD-10-CM | POA: Diagnosis not present

## 2023-03-24 DIAGNOSIS — N2581 Secondary hyperparathyroidism of renal origin: Secondary | ICD-10-CM | POA: Diagnosis not present

## 2023-03-24 DIAGNOSIS — Z992 Dependence on renal dialysis: Secondary | ICD-10-CM | POA: Diagnosis not present

## 2023-03-24 DIAGNOSIS — N186 End stage renal disease: Secondary | ICD-10-CM | POA: Diagnosis not present

## 2023-03-24 DIAGNOSIS — D631 Anemia in chronic kidney disease: Secondary | ICD-10-CM | POA: Diagnosis not present

## 2023-03-24 NOTE — Telephone Encounter (Signed)
Can you please take a look at the phone note  for 03/21/2023

## 2023-03-24 NOTE — Telephone Encounter (Signed)
I think he needs gabapentin titrated upward before changing to Lyrica

## 2023-03-24 NOTE — Telephone Encounter (Signed)
Will have patient discuss with Dr.Miller

## 2023-03-27 ENCOUNTER — Other Ambulatory Visit: Payer: Self-pay

## 2023-03-27 ENCOUNTER — Ambulatory Visit: Payer: Self-pay

## 2023-03-27 DIAGNOSIS — D631 Anemia in chronic kidney disease: Secondary | ICD-10-CM | POA: Diagnosis not present

## 2023-03-27 DIAGNOSIS — N2581 Secondary hyperparathyroidism of renal origin: Secondary | ICD-10-CM | POA: Diagnosis not present

## 2023-03-27 DIAGNOSIS — N186 End stage renal disease: Secondary | ICD-10-CM | POA: Diagnosis not present

## 2023-03-27 DIAGNOSIS — Z992 Dependence on renal dialysis: Secondary | ICD-10-CM | POA: Diagnosis not present

## 2023-03-27 NOTE — Patient Outreach (Signed)
  Care Coordination   03/27/2023 Name: CORNEIL SCHULL MRN: [id number] DOB: [birth year]   Care Coordination Outreach Attempts:  An unsuccessful telephone outreach was attempted for a scheduled appointment today.  Follow Up Plan:  Additional outreach attempts will be made to offer the patient care coordination information and services.   Encounter Outcome:  No Answer   Care Coordination Interventions:  No, not indicated    Delsa Sale, RN, BSN, CCM Care Management Coordinator [facility] Care Management  Direct Phone: [phone]

## 2023-03-27 NOTE — Telephone Encounter (Signed)
Patient called requesting refill on oxycodone 5-325 mg.   Patient also inquired about increase in gabapentin.  Oxycodone has been pended and sent to Dr. Jacalyn Lefevre

## 2023-03-29 ENCOUNTER — Encounter: Payer: Self-pay | Admitting: Family Medicine

## 2023-03-29 ENCOUNTER — Ambulatory Visit: Payer: Self-pay

## 2023-03-29 DIAGNOSIS — N2581 Secondary hyperparathyroidism of renal origin: Secondary | ICD-10-CM | POA: Diagnosis not present

## 2023-03-29 DIAGNOSIS — N186 End stage renal disease: Secondary | ICD-10-CM | POA: Diagnosis not present

## 2023-03-29 DIAGNOSIS — D631 Anemia in chronic kidney disease: Secondary | ICD-10-CM | POA: Diagnosis not present

## 2023-03-29 DIAGNOSIS — Z992 Dependence on renal dialysis: Secondary | ICD-10-CM | POA: Diagnosis not present

## 2023-03-29 NOTE — Progress Notes (Signed)
This encounter was created in error - please disregard.

## 2023-03-29 NOTE — Telephone Encounter (Signed)
Patient called again about refill on oxycodone.   Medication pended for approval/refusal.  Message sent to Dr. Jacalyn Lefevre

## 2023-03-29 NOTE — Patient Instructions (Signed)
Visit Information  Thank you for taking time to visit with me today. Please don't hesitate to contact me if I can be of assistance to you.   Following are the goals we discussed today:   Goals Addressed               This Visit's Progress     Patient Stated     I am having pain from a recent fall (pt-stated)        Care Coordination Interventions: Reviewed provider established plan for pain management Determined patient contacted his PCP office on 03/27/23 to advise his Oxycodone supply is getting low, he requested a refill  Discussed with patient as of today, this medication has not been refilled and he is down to his last few doses of medication, he is requesting assistance  Placed outbound call to [facility], left a voice message requesting a refill for patient's Oxycodone to be sent to his preferred pharmacy listed Provided my contact number and requested a return call to advise if this request will be approved       Other     COMPLETED: To get relief from neuropathy in fingertips        Care Coordination Interventions: Evaluation of current treatment plan related to diabetic neuropathy  and patient's adherence to plan as established by provider Discussed with patient, his PCP does not wish to change his Gabapentin to Pregabalin at this time Discussed with patient he will continue to manage this pain with use of Oxycodone and hand splints as directed  Instructed patient to keep Dr. Hyacinth Meeker well informed of new symptoms or concerns           Our next appointment is by telephone on 06/23/23 at 11:00 AM   Please call the care guide team at [phone] if you need to cancel or reschedule your appointment.   If you are experiencing a Mental Health or Behavioral Health Crisis or need someone to talk to, please call 1-800-273-TALK (toll free, 24 hour hotline)  The patient verbalized understanding of instructions, educational materials, and care plan provided today and  DECLINED offer to receive copy of patient instructions, educational materials, and care plan.   Delsa Sale, RN, BSN, CCM Care Management Coordinator [facility] Care Management  Direct Phone: [phone]

## 2023-03-29 NOTE — Patient Outreach (Signed)
  Care Coordination   Follow Up Visit Note   03/29/2023 Name: Jacob [name] MRN: [id number] DOB: 1950/11/21  Jacob [name] is a 72 y.o. year old male who sees [name], MD for primary care. I spoke with  David Stall by phone today.  What matters to the patients health and wellness today?  Patient would like assistance with getting his pain medication refilled.     Goals Addressed               This Visit's Progress     Patient Stated     I am having pain from a recent fall (pt-stated)        Care Coordination Interventions: Reviewed provider established plan for pain management Determined patient contacted his PCP office on 03/27/23 to advise his Oxycodone supply is getting low, he requested a refill  Discussed with patient as of today, this medication has not been refilled and he is down to his last few doses of medication, he is requesting assistance  Placed outbound call to [facility], left a voice message requesting a refill for patient's Oxycodone to be sent to his preferred pharmacy listed Provided my contact number and requested a return call to advise if this request will be approved       Other     COMPLETED: To get relief from neuropathy in fingertips        Care Coordination Interventions: Evaluation of current treatment plan related to diabetic neuropathy  and patient's adherence to plan as established by provider Discussed with patient, his PCP does not wish to change his Gabapentin to Pregabalin at this time Discussed with patient he will continue to manage this pain with use of Oxycodone and hand splints as directed  Instructed patient to keep Dr. Hyacinth Meeker well informed of new symptoms or concerns     Interventions Today    Flowsheet Row Most Recent Value  Chronic Disease   Chronic disease during today's visit Other  [chronic pain]  General Interventions   General Interventions Discussed/Reviewed General Interventions Discussed, General  Interventions Reviewed, Doctor Visits, Communication with  Doctor Visits Discussed/Reviewed Doctor Visits Reviewed, Doctor Visits Discussed, PCP  Communication with PCP/Specialists  Education Interventions   Education Provided Provided Education  Provided Verbal Education On Medication  Pharmacy Interventions   Pharmacy Dicussed/Reviewed Pharmacy Topics Discussed, Pharmacy Topics Reviewed, Medications and their functions          SDOH assessments and interventions completed:  No     Care Coordination Interventions:  Yes, provided   Follow up plan: Follow up call scheduled for 06/23/23 @11 :00 AM    Encounter Outcome:  Pt. Visit Completed

## 2023-03-30 ENCOUNTER — Other Ambulatory Visit: Payer: Self-pay

## 2023-03-30 ENCOUNTER — Ambulatory Visit: Payer: Self-pay

## 2023-03-30 ENCOUNTER — Ambulatory Visit (HOSPITAL_COMMUNITY)
Admission: RE | Admit: 2023-03-30 | Discharge: 2023-03-30 | Disposition: A | Discharge status: Home / Self Care | Payer: 59 | Source: Ambulatory Visit | Attending: Gastroenterology | Admitting: Gastroenterology

## 2023-03-30 DIAGNOSIS — B20 Human immunodeficiency virus [HIV] disease: Secondary | ICD-10-CM

## 2023-03-30 DIAGNOSIS — I10 Essential (primary) hypertension: Secondary | ICD-10-CM

## 2023-03-30 DIAGNOSIS — Z992 Dependence on renal dialysis: Secondary | ICD-10-CM

## 2023-03-30 MED ORDER — OCTREOTIDE ACETATE 20 MG IM KIT
10.0000 mg | PACK | INTRAMUSCULAR | Status: DC
  Administered 2023-03-30: 10 mg via INTRAMUSCULAR
  Filled 2023-03-30: qty 1

## 2023-03-30 MED ORDER — OXYCODONE-ACETAMINOPHEN 5-325 MG PO TABS: 1.0000 | ORAL_TABLET | Freq: Four times a day (QID) | ORAL | 0 refills | Status: DC | PRN

## 2023-03-30 MED ORDER — BIKTARVY 50-200-25 MG PO TABS: 1.0000 | ORAL_TABLET | Freq: Every day | ORAL | 5 refills | Status: DC

## 2023-03-30 NOTE — Telephone Encounter (Signed)
[name], MD  to Me     03/29/23  9:33 PM I am unable to use [employer] since I have a new iphone

## 2023-03-30 NOTE — Telephone Encounter (Signed)
Rx needs to be filled by Dr Hyacinth Meeker only

## 2023-03-30 NOTE — Telephone Encounter (Signed)
Message forwarded to Richarda Blade, NP

## 2023-03-30 NOTE — Patient Outreach (Signed)
  Care Coordination   03/30/2023 Name: MALLIE CAROSELLA MRN: [id number] DOB: 1951-06-20   Care Coordination Outreach Attempts:  An unsuccessful telephone outreach was attempted for a scheduled appointment today.  Follow Up Plan:  Additional outreach attempts will be made to offer the patient care coordination information and services.   Encounter Outcome:  No Answer   Care Coordination Interventions:  No, not indicated    Delsa Sale, RN, BSN, CCM Care Management Coordinator [facility] Care Management Direct Phone: [phone]

## 2023-03-30 NOTE — Telephone Encounter (Signed)
Patient is waiting on medication refill.  Message sent to Abbey Chatters, NP

## 2023-03-30 NOTE — Patient Instructions (Addendum)
Visit Information  Thank you for taking time to visit with me today. Please don't hesitate to contact me if I can be of assistance to you.   Following are the goals we discussed today:   Goals Addressed               This Visit's Progress     Patient Stated     I am having pain from a recent fall (pt-stated)        Care Coordination Interventions: Reviewed provider established plan for pain management Received inbound call from patient stating he has yet to receive a refill for his Oxycodone, he is down to 4 tablets and is cutting them in half Placed outbound call to PCP office for Dr. Hyacinth Meeker, spoke with French Ana who advised the clinicians are aware the patient needs his pain medication refilled, she is unsure of the status and or if this will be completed for the patient today Sent in basket message to Richarda Blade NP requesting assistance with patient's refill  Placed pharmacy referral requesting assistance with helping patient get his Oxycodone refilled Informed patient of the stated above and determined patient will wait to hear from his pharmacy  Reply received from Richarda Blade NP advising she has refilled patient's Oxycodone Placed outbound call to patient to inform him of the stated above and patient verbalizes understanding  Pharmacy referral cancelled        Our next appointment is by telephone on 05/23/23 at 11:00 AM   Please call the care guide team at [phone] if you need to cancel or reschedule your appointment.   If you are experiencing a Mental Health or Behavioral Health Crisis or need someone to talk to, please call 1-800-273-TALK (toll free, 24 hour hotline)  The patient verbalized understanding of instructions, educational materials, and care plan provided today and DECLINED offer to receive copy of patient instructions, educational materials, and care plan.   Delsa Sale, RN, BSN, CCM Care Management Coordinator [facility] Care Management  Direct Phone:  [phone] e

## 2023-03-30 NOTE — Patient Outreach (Addendum)
  Care Coordination   Follow Up Visit Note   03/30/2023 Name: Jacob [name] MRN: [id number] DOB: November 09, 1950  Jacob [name] is a 72 y.o. year old male who sees [name], MD for primary care. I spoke with  Jacob [name] by phone today.  What matters to the patients health and wellness today?  Patient needs assistance getting his pain medication refilled.     Goals Addressed               This Visit's Progress     Patient Stated  COMPLETED     I am having pain from a recent fall (pt-stated)        Care Coordination Interventions: Reviewed provider established plan for pain management Received inbound call from patient stating he has yet to receive a refill for his Oxycodone, he is down to 4 tablets and is cutting them in half Placed outbound call to PCP office for Dr. Hyacinth Meeker, spoke with French Ana who advised the clinicians are aware the patient needs his pain medication refilled, she is unsure of the status and or if this will be completed for the patient today Sent in basket message to Richarda Blade NP requesting assistance with patient's refill  Placed pharmacy referral requesting assistance with helping patient get his Oxycodone refilled Informed patient of the stated above and determined patient will wait to hear from his pharmacy  Reply received from Richarda Blade NP advising she has refilled patient's Oxycodone Placed outbound call to patient to inform him of the stated above and patient verbalizes understanding  Pharmacy referral cancelled     Interventions Today    Flowsheet Row Most Recent Value  Chronic Disease   Chronic disease during today's visit Other  [chronic pain]  General Interventions   General Interventions Discussed/Reviewed General Interventions Discussed, General Interventions Reviewed, Communication with  Communication with PCP/Specialists  Education Interventions   Education Provided Provided Education  Provided Verbal Education On Medication   Pharmacy Interventions   Pharmacy Dicussed/Reviewed Pharmacy Topics Discussed, Medications and their functions, Pharmacy Topics Reviewed, Referral to Pharmacist, Medication Adherence  Medication Adherence Unable to refill medication          SDOH assessments and interventions completed:  No     Care Coordination Interventions:  Yes, provided   Follow up plan: Follow up call scheduled for 05/23/23 @11 :00 AM     Encounter Outcome:  Pt. Visit Completed

## 2023-03-30 NOTE — Telephone Encounter (Signed)
Oxycodone refilled.

## 2023-03-30 NOTE — Addendum Note (Signed)
Addended by: Riley Churches on: 03/30/2023 04:49 PM   Modules accepted: Orders

## 2023-03-31 DIAGNOSIS — N2581 Secondary hyperparathyroidism of renal origin: Secondary | ICD-10-CM | POA: Diagnosis not present

## 2023-03-31 DIAGNOSIS — N186 End stage renal disease: Secondary | ICD-10-CM | POA: Diagnosis not present

## 2023-03-31 DIAGNOSIS — D631 Anemia in chronic kidney disease: Secondary | ICD-10-CM | POA: Diagnosis not present

## 2023-03-31 DIAGNOSIS — Z992 Dependence on renal dialysis: Secondary | ICD-10-CM | POA: Diagnosis not present

## 2023-04-03 DIAGNOSIS — N186 End stage renal disease: Secondary | ICD-10-CM | POA: Diagnosis not present

## 2023-04-03 DIAGNOSIS — N2581 Secondary hyperparathyroidism of renal origin: Secondary | ICD-10-CM | POA: Diagnosis not present

## 2023-04-03 DIAGNOSIS — Z992 Dependence on renal dialysis: Secondary | ICD-10-CM | POA: Diagnosis not present

## 2023-04-03 DIAGNOSIS — D631 Anemia in chronic kidney disease: Secondary | ICD-10-CM | POA: Diagnosis not present

## 2023-04-03 NOTE — Telephone Encounter (Signed)
Phone note was already completed.

## 2023-04-05 DIAGNOSIS — Z992 Dependence on renal dialysis: Secondary | ICD-10-CM | POA: Diagnosis not present

## 2023-04-05 DIAGNOSIS — N186 End stage renal disease: Secondary | ICD-10-CM | POA: Diagnosis not present

## 2023-04-05 DIAGNOSIS — N2581 Secondary hyperparathyroidism of renal origin: Secondary | ICD-10-CM | POA: Diagnosis not present

## 2023-04-05 DIAGNOSIS — D631 Anemia in chronic kidney disease: Secondary | ICD-10-CM | POA: Diagnosis not present

## 2023-04-06 ENCOUNTER — Telehealth: Payer: Self-pay

## 2023-04-06 ENCOUNTER — Telehealth: Payer: Self-pay | Admitting: Podiatry

## 2023-04-06 ENCOUNTER — Ambulatory Visit (INDEPENDENT_AMBULATORY_CARE_PROVIDER_SITE_OTHER): Payer: 59 | Admitting: Podiatry

## 2023-04-06 ENCOUNTER — Other Ambulatory Visit (INDEPENDENT_AMBULATORY_CARE_PROVIDER_SITE_OTHER): Payer: 59 | Admitting: Podiatry

## 2023-04-06 DIAGNOSIS — M79675 Pain in left toe(s): Secondary | ICD-10-CM | POA: Diagnosis not present

## 2023-04-06 DIAGNOSIS — B351 Tinea unguium: Secondary | ICD-10-CM

## 2023-04-06 DIAGNOSIS — M79674 Pain in right toe(s): Secondary | ICD-10-CM

## 2023-04-06 DIAGNOSIS — M86471 Chronic osteomyelitis with draining sinus, right ankle and foot: Secondary | ICD-10-CM

## 2023-04-06 DIAGNOSIS — B353 Tinea pedis: Secondary | ICD-10-CM

## 2023-04-06 MED ORDER — GABAPENTIN 300 MG PO CAPS: 300.0000 mg | ORAL_CAPSULE | Freq: Two times a day (BID) | ORAL | 3 refills | Status: DC

## 2023-04-06 NOTE — Telephone Encounter (Signed)
Patient notified and agreed.  

## 2023-04-06 NOTE — Progress Notes (Signed)
Subjective:  [name], male    DOB: Jun 19, 1951,  MRN: [id number]   72 y.o. male presents for follow-up of rash on his left foot as well as coming in to have his nails trimmed.  Also history of surgery on his right foot for resection of infected bone at the fourth MPJ.  He reports that he is doing very well at this visit.  He has no issues with the left dorsal foot rash, resolved.  He also reports the right foot is doing well no active issues with wounds on the right foot they have closed he does have a wound on the right ankle that is being managed by wound care center.  He does have some inability to trim his nails due to thickness dystrophy and they are painful.  Past Medical History:  Diagnosis Date   Acute respiratory failure (HCC) 03/01/2018   Anemia    Arthritis    "all over; mostly knees and back" (02/28/2018)   Chronic combined systolic and diastolic CHF (congestive heart failure) (HCC)    Chronic lower back pain    stenosis   Community acquired pneumonia 09/06/2013   COPD (chronic obstructive pulmonary disease) (HCC)    Coronary atherosclerosis of native coronary artery    a. 02/2003 s/p CABG x 2 (VG->RI, VG->RPDA; b. 11/2019 PCI: LM nl, LAD 90d, D3 50, RI 100, LCX 100p, OM3 100 - fills via L->L collats from D2/dLAD, RCA 100p, VG->RPDA ok, VG->RI 95 (3.5x48 Synergy XD DES).   Drug abuse (HCC)    hx; tested for cocaine as recently as 2/08. says he is not using drugs now - avoided defib. for this reason    ESRD (end stage renal disease) (HCC)    Hemo M-W-F- [facility]   Fall at home 10/2020   GERD (gastroesophageal reflux disease)    takes OTC meds as needed   GI bleeding    a. 11/2019 EGD: angiodysplastic lesions w/ bleeding s/p argon plasma/clipping/epi inj. Multiple admissions for the same.   Glaucoma    uses eye drops daily   Hepatitis B 1968   "tx'd w/isolation; caught it from toilet stools in gym"   History of blood transfusion 03/01/2019   History of colon  polyps    benign   History of gout    takes Allopurinol daily as well as Colchicine-if needed (02/28/2018)   History of kidney stones    HTN (hypertension)    takes Coreg,Imdur.and Apresoline daily   Human immunodeficiency virus (HIV) disease (HCC) dx'd 1995   on Biktarvy as of 12/2020.     Hyperlipidemia    Ischemic cardiomyopathy    a. 01/2019 Echo: EF 40-45%, diffuse HK, mild basal septal hypertrophy. Diast dysfxn. Nl RV size/fxn. Sev dil LA. Triv MR/TR/PR.   Muscle spasm    takes Zanaflex as needed   Myocardial infarction (HCC) ~ 2004/2005   Nocturia    Peripheral neuropathy    takes gabapentin daily   Pneumonia    "at least twice" (02/28/2018)   SDH (subdural hematoma) (HCC)    Syphilis, unspecified    Type II diabetes mellitus (HCC) 2004   Lantus daily.Average fasting blood sugar 125-199   Wears glasses    Wears partial dentures     Allergies  Allergen Reactions   Augmentin [Amoxicillin-Pot Clavulanate] Diarrhea and Other (See Comments)    Severe diarrhea   Morphine Sulfate Anaphylaxis    Can't mix with oxycodone    Mucinex Fast-Max Other (See Comments)  Intense sweating    Amphetamines Other (See Comments)    Unknown reaction    ROS: Negative except as per HPI above  Objective:  General: AAO x3, NAD  Dermatological: On the right foot at prior surgical site there is healed dorsal incision with no evidence of ulceration drainage erythema or edema.  Plantar to the prior fourth metatarsal head there is minimal hyperkeratotic tissue with no open wound present no drainage. on the left foot at site of prior dry peeling rash on the dorsal forefoot this is now resolved with normal skin present no rash or wound.  Vascular:  Dorsalis Pedis artery and Posterior Tibial artery pedal pulses are palpable bilateral.  Capillary fill time < 3 sec to all digits.   Neruologic: Grossly diminished via light touch bilateral. Protective threshold diminished bilaterally  Musculoskeletal:  Status post right fourth metatarsal head resection  Gait: Unassisted, Nonantalgic.   No images are attached to the encounter.  Radiographs:  Date: 12/29/2022 XR the right foot Weightbearing AP/Lateral/Oblique   Findings: Attention directed the fourth metatarsal phalangeal joint there is noted to be removal of the 4 metatarsal head.  There is still some osseous irregularity at the lateral aspect of the prior joint consistent with osseous fragments.  There is no significant concern for residual infection at the fourth metatarsal shaft or the fourth proximal phalanx both of which would have been resected.  No soft tissue emphysema noted. Assessment:   1. Pain due to onychomycosis of toenails of both feet   2. Chronic osteomyelitis of right foot with draining sinus (HCC)   3. Tinea pedis of left foot      Plan:  Patient was evaluated and treated and all questions answered.  # Tinea pedis of the left dorsal forefoot -Resolved after ketoconazole use additional PRN.  # History of ulceration and osteomyelitis in the right fourth metatarsal phalangeal joint.  Status post fourth MPJ resection right foot on July 20, 2022 now fully healed with no evidence of residual infection or recurrence of ulceration dorsal or plantar to the prior fourth metatarsal phalangeal joint -Continue to monitor for any recurrence of ulceration.  #Onychomycosis with pain  -Nails palliatively debrided as below. -Educated on self-care  Procedure: Nail Debridement Rationale: Pain Type of Debridement: manual, sharp debridement. Instrumentation: Nail nipper, rotary burr. Number of Nails: 10  Return in about 3 months (around 07/07/2023) for [facility].          Corinna Gab, DPM [facility]

## 2023-04-06 NOTE — Telephone Encounter (Signed)
Patient called.  He was seen this morning.  He says he currently takes 300mg  gabapentin QD for his neuropathy, but it's not working and it's getting worse.  He wants to know if you will give him something different to take.    Call back# [phone]

## 2023-04-06 NOTE — Telephone Encounter (Signed)
Patient called stating that he thinks his neuropathy is getting worse. He had a real hard time with it at dialysis today. He wants to know if he should maybe turn the air off in his house to help with it. He does take gabapentin 300 mg daily  Message sent to Richarda Blade, NP (covering provider)

## 2023-04-06 NOTE — Progress Notes (Signed)
Rx for additional dosage frequency gabapentin sent

## 2023-04-06 NOTE — Telephone Encounter (Signed)
Continue on Gabapentin .might be too hot turning off the air since the Temperatures will be in the 90's this next few days. May be reducing to what is comfortable to you.

## 2023-04-07 ENCOUNTER — Other Ambulatory Visit: Payer: Self-pay | Admitting: Nurse Practitioner

## 2023-04-07 ENCOUNTER — Other Ambulatory Visit: Payer: Self-pay | Admitting: Cardiovascular Disease

## 2023-04-07 DIAGNOSIS — Z992 Dependence on renal dialysis: Secondary | ICD-10-CM | POA: Diagnosis not present

## 2023-04-07 DIAGNOSIS — N186 End stage renal disease: Secondary | ICD-10-CM | POA: Diagnosis not present

## 2023-04-07 DIAGNOSIS — D631 Anemia in chronic kidney disease: Secondary | ICD-10-CM | POA: Diagnosis not present

## 2023-04-07 DIAGNOSIS — N2581 Secondary hyperparathyroidism of renal origin: Secondary | ICD-10-CM | POA: Diagnosis not present

## 2023-04-10 DIAGNOSIS — N2581 Secondary hyperparathyroidism of renal origin: Secondary | ICD-10-CM | POA: Diagnosis not present

## 2023-04-10 DIAGNOSIS — Z992 Dependence on renal dialysis: Secondary | ICD-10-CM | POA: Diagnosis not present

## 2023-04-10 DIAGNOSIS — N186 End stage renal disease: Secondary | ICD-10-CM | POA: Diagnosis not present

## 2023-04-10 DIAGNOSIS — D631 Anemia in chronic kidney disease: Secondary | ICD-10-CM | POA: Diagnosis not present

## 2023-04-11 ENCOUNTER — Encounter (HOSPITAL_COMMUNITY): Payer: Self-pay

## 2023-04-11 ENCOUNTER — Other Ambulatory Visit (HOSPITAL_COMMUNITY): Payer: Self-pay

## 2023-04-11 ENCOUNTER — Other Ambulatory Visit: Payer: Self-pay

## 2023-04-11 DIAGNOSIS — B20 Human immunodeficiency virus [HIV] disease: Secondary | ICD-10-CM

## 2023-04-11 MED ORDER — BIKTARVY 50-200-25 MG PO TABS: 1.0000 | ORAL_TABLET | Freq: Every day | ORAL | 5 refills | Status: DC

## 2023-04-11 MED ORDER — BIKTARVY 50-200-25 MG PO TABS
1.0000 | ORAL_TABLET | Freq: Every day | ORAL | 5 refills | Status: DC
  Filled 2023-04-11: qty 30, 30d supply, fill #0

## 2023-04-13 DIAGNOSIS — Z992 Dependence on renal dialysis: Secondary | ICD-10-CM | POA: Diagnosis not present

## 2023-04-13 DIAGNOSIS — I871 Compression of vein: Secondary | ICD-10-CM | POA: Diagnosis not present

## 2023-04-13 DIAGNOSIS — D631 Anemia in chronic kidney disease: Secondary | ICD-10-CM | POA: Diagnosis not present

## 2023-04-13 DIAGNOSIS — T82868A Thrombosis of vascular prosthetic devices, implants and grafts, initial encounter: Secondary | ICD-10-CM | POA: Diagnosis not present

## 2023-04-13 DIAGNOSIS — N186 End stage renal disease: Secondary | ICD-10-CM | POA: Diagnosis not present

## 2023-04-13 DIAGNOSIS — N2581 Secondary hyperparathyroidism of renal origin: Secondary | ICD-10-CM | POA: Diagnosis not present

## 2023-04-14 DIAGNOSIS — D631 Anemia in chronic kidney disease: Secondary | ICD-10-CM | POA: Diagnosis not present

## 2023-04-14 DIAGNOSIS — N2581 Secondary hyperparathyroidism of renal origin: Secondary | ICD-10-CM | POA: Diagnosis not present

## 2023-04-14 DIAGNOSIS — N186 End stage renal disease: Secondary | ICD-10-CM | POA: Diagnosis not present

## 2023-04-14 DIAGNOSIS — Z992 Dependence on renal dialysis: Secondary | ICD-10-CM | POA: Diagnosis not present

## 2023-04-17 DIAGNOSIS — D631 Anemia in chronic kidney disease: Secondary | ICD-10-CM | POA: Diagnosis not present

## 2023-04-17 DIAGNOSIS — N186 End stage renal disease: Secondary | ICD-10-CM | POA: Diagnosis not present

## 2023-04-17 DIAGNOSIS — Z992 Dependence on renal dialysis: Secondary | ICD-10-CM | POA: Diagnosis not present

## 2023-04-17 DIAGNOSIS — N2581 Secondary hyperparathyroidism of renal origin: Secondary | ICD-10-CM | POA: Diagnosis not present

## 2023-04-19 ENCOUNTER — Telehealth: Payer: Self-pay

## 2023-04-19 DIAGNOSIS — D631 Anemia in chronic kidney disease: Secondary | ICD-10-CM | POA: Diagnosis not present

## 2023-04-19 DIAGNOSIS — Z992 Dependence on renal dialysis: Secondary | ICD-10-CM | POA: Diagnosis not present

## 2023-04-19 DIAGNOSIS — N186 End stage renal disease: Secondary | ICD-10-CM | POA: Diagnosis not present

## 2023-04-19 DIAGNOSIS — N2581 Secondary hyperparathyroidism of renal origin: Secondary | ICD-10-CM | POA: Diagnosis not present

## 2023-04-19 NOTE — Telephone Encounter (Signed)
Patient called and states that he tripped and had a fall this morning. He states that he was on his way to dialysis. Once he got to there office they evaluated him and he iced his right knee. He states that his entire right side is where he fell and it hurts. He's currently taking oxycodone to relieve the pain. He states that if he doesn't feel better by tomorrow he will call and schedule appointment to be seen Friday 04/21/2023. Message routed to PCP Hyacinth Meeker Bertram Millard, MD as Lorain Childes.

## 2023-04-20 ENCOUNTER — Emergency Department (HOSPITAL_COMMUNITY)
Admission: EM | Admit: 2023-04-20 | Discharge: 2023-04-20 | Disposition: A | Discharge status: Home / Self Care | Payer: 59 | Attending: Emergency Medicine | Admitting: Emergency Medicine

## 2023-04-20 ENCOUNTER — Encounter (HOSPITAL_COMMUNITY): Payer: Self-pay

## 2023-04-20 ENCOUNTER — Emergency Department (HOSPITAL_COMMUNITY): Payer: 59

## 2023-04-20 ENCOUNTER — Other Ambulatory Visit: Payer: Self-pay

## 2023-04-20 DIAGNOSIS — N186 End stage renal disease: Secondary | ICD-10-CM | POA: Diagnosis not present

## 2023-04-20 DIAGNOSIS — M25461 Effusion, right knee: Secondary | ICD-10-CM | POA: Diagnosis not present

## 2023-04-20 DIAGNOSIS — J449 Chronic obstructive pulmonary disease, unspecified: Secondary | ICD-10-CM | POA: Insufficient documentation

## 2023-04-20 DIAGNOSIS — E1122 Type 2 diabetes mellitus with diabetic chronic kidney disease: Secondary | ICD-10-CM | POA: Insufficient documentation

## 2023-04-20 DIAGNOSIS — B20 Human immunodeficiency virus [HIV] disease: Secondary | ICD-10-CM | POA: Diagnosis not present

## 2023-04-20 DIAGNOSIS — I509 Heart failure, unspecified: Secondary | ICD-10-CM | POA: Diagnosis not present

## 2023-04-20 DIAGNOSIS — R0902 Hypoxemia: Secondary | ICD-10-CM | POA: Diagnosis not present

## 2023-04-20 DIAGNOSIS — Z794 Long term (current) use of insulin: Secondary | ICD-10-CM | POA: Diagnosis not present

## 2023-04-20 DIAGNOSIS — R609 Edema, unspecified: Secondary | ICD-10-CM | POA: Diagnosis not present

## 2023-04-20 DIAGNOSIS — Y9301 Activity, walking, marching and hiking: Secondary | ICD-10-CM | POA: Diagnosis not present

## 2023-04-20 DIAGNOSIS — M25561 Pain in right knee: Secondary | ICD-10-CM | POA: Diagnosis not present

## 2023-04-20 DIAGNOSIS — W010XXA Fall on same level from slipping, tripping and stumbling without subsequent striking against object, initial encounter: Secondary | ICD-10-CM | POA: Diagnosis not present

## 2023-04-20 DIAGNOSIS — M1711 Unilateral primary osteoarthritis, right knee: Secondary | ICD-10-CM | POA: Diagnosis not present

## 2023-04-20 DIAGNOSIS — M7989 Other specified soft tissue disorders: Secondary | ICD-10-CM | POA: Diagnosis not present

## 2023-04-20 DIAGNOSIS — Y92000 Kitchen of unspecified non-institutional (private) residence as  the place of occurrence of the external cause: Secondary | ICD-10-CM | POA: Insufficient documentation

## 2023-04-20 DIAGNOSIS — S80919A Unspecified superficial injury of unspecified knee, initial encounter: Secondary | ICD-10-CM | POA: Diagnosis not present

## 2023-04-20 DIAGNOSIS — S83104A Unspecified dislocation of right knee, initial encounter: Secondary | ICD-10-CM | POA: Diagnosis not present

## 2023-04-20 NOTE — ED Notes (Signed)
Knee brace has been applied by the ortho tech. Pt assisted with clothing and dressed. Refusing to be discharged at this time and states he cannot walk. Pt noted upset and noted rude with staff. States "they just need to admit my ass." Notified the ED PA at this time.

## 2023-04-20 NOTE — ED Triage Notes (Addendum)
PT BIB EMS for a fall yesterday, EMS went to the home twice yesterday but called back out due to increased pain and swelling to right knee.    Pt is a dialysis patient, on M, W, F.  Has not missed any dialysis treatments.

## 2023-04-20 NOTE — ED Provider Notes (Addendum)
[place]  EMERGENCY DEPARTMENT AT [facility] Provider Note   CSN: [id number] Arrival date & time: 04/20/23  1407     History  Chief Complaint  Patient presents with   Fall   HPI Jacob [name] is a 72 y.o. male with HIV, type 2 diabetes, ESRD and CHF and COPD presenting for knee pain.  Patient states he fell yesterday.  He was walking around in the kitchen and tripped over his left leg while walking around the table landing on his right knee.  Denies head injury or loss of consciousness.  Endorses pain and swelling in the right knee and difficulty ambulating.  States pain is much improved after he took oxycodone around 130 today.  Patient is up-to-date on his dialysis.   Fall       Home Medications Prior to Admission medications   Medication Sig Start Date End Date Taking? Authorizing Provider  acetaminophen (TYLENOL) 650 MG CR tablet Take 1,300 mg by mouth 3 (three) times daily.    [provider]  allopurinol (ZYLOPRIM) 100 MG tablet Take one tablet by mouth once daily. 03/02/23   [name], MD  atorvastatin (LIPITOR) 10 MG tablet TAKE 1 TABLET(10 MG) BY MOUTH DAILY 11/08/22   [name], MD  B-D UF III MINI PEN NEEDLES 31G X 5 MM MISC USE 4 TIMES DAILY 06/06/22   [name], MD  BIKTARVY 50-200-25 MG TABS tablet Take 1 tablet by mouth daily. 04/11/23   Gardiner Barefoot, MD  Calcium Acetate 667 MG TABS Take 667-2,001 mg by mouth See admin instructions. Take 2001 mg by mouth three times a day with meals and 667 mg with each snack 01/20/21   [provider]  carvedilol (COREG) 3.125 MG tablet Take 1 tablet (3.125 mg total) by mouth 2 (two) times daily with a meal. 08/10/22   [name], MD  diclofenac Sodium (VOLTAREN) 1 % GEL Apply 2 grams topically to the affected area three times daily as needed for pain. 12/19/22   [name], MD  folic acid (FOLVITE) 1 MG tablet TAKE 1 TABLET(1 MG) BY MOUTH DAILY 02/06/23   [name], MD  gabapentin (NEURONTIN) 300 MG capsule TAKE 1 CAPSULE BY MOUTH DAILY. 05/11/22   [name], MD  gabapentin (NEURONTIN) 300 MG capsule Take 1 capsule (300 mg total) by mouth 2 (two) times daily. 04/06/23 10/03/23  Standiford, Jenelle Mages, DPM  gentamicin ointment (GARAMYCIN) 0.1 % Apply 1 Application topically daily. 01/06/23   Standiford, Jenelle Mages, DPM  glucose blood (ONETOUCH ULTRA) test strip Check blood sugar three times daily E11.40 03/10/23   [name], MD  insulin lispro (HUMALOG) 100 UNIT/ML KwikPen INJECT 20 UNITS UNDER THE SKIN THREE TIMES DAILY AS NEEDED FOR HIGH BLOOD SUGAR(ABOVE 150) 02/13/23   [name], MD  isosorbide mononitrate (IMDUR) 30 MG 24 hr tablet Take 1 tablet (30 mg total) by mouth daily. 05/11/22   [name], MD  ketoconazole (NIZORAL) 2 % cream Apply 1 Application topically daily. 12/01/22   Standiford, Jenelle Mages, DPM  Lancets (ONETOUCH DELICA PLUS [place]) MISC USE 3 TIMES DAILY 08/15/22   [name], MD  latanoprost (XALATAN) 0.005 % ophthalmic solution Place 1 drop into both eyes at bedtime.    [provider]  lidocaine-prilocaine (EMLA) cream Apply 1 application topically every Monday, Wednesday, and Friday with hemodialysis.    [provider]  Menthol-Camphor (TIGER BALM ARTHRITIS RUB) 11-11 %  CREA Apply 1 Application topically daily as needed (pain).    [provider]  multivitamin (RENA-VIT) TABS tablet Take 1 tablet by mouth daily.    [provider]  nitroGLYCERIN (NITROSTAT) 0.3 MG SL tablet PLACE 1 TABLET UNDER THE TONGUE AS NEEDED FOR CHEST PAIN 04/07/23   [name], MD  octreotide (SANDOSTATIN LAR DEPOT) 10 MG injection Inject 10 mg into the muscle every 28 days. 08/18/22   Hilarie Fredrickson, MD  ondansetron (ZOFRAN) 4 MG tablet TAKE 1 TABLET(4 MG) BY MOUTH EVERY 8 HOURS AS NEEDED FOR NAUSEA OR VOMITING 07/25/22   [name], MD  oxyCODONE-acetaminophen (PERCOCET) 5-325 MG  tablet Take 1 tablet by mouth every 6 (six) hours as needed for severe pain. 03/30/23 03/29/24  [name], [name], NP  timolol (TIMOPTIC) 0.5 % ophthalmic solution Place 1 drop into both eyes in the morning. 04/04/19   [provider]  umeclidinium-vilanterol [facility] ELLIPTA) 62.5-25 MCG/ACT AEPB INHALE 1 PUFF BY MOUTH EVERY DAY 05/18/22   [name], MD      Allergies    Augmentin [amoxicillin-pot clavulanate], Morphine sulfate, Mucinex fast-max, and Amphetamines    Review of Systems   See HPI for pertinent positives  Physical Exam Updated Vital Signs BP 127/72 (BP Location: Left Arm)   Pulse 79   Temp 97.9 F (36.6 C) (Oral)   Resp 17   Ht 6\' 2"  (1.88 m)   Wt 81.6 kg   SpO2 100%   BMI 23.11 kg/m  Physical Exam Constitutional:      Appearance: Normal appearance.  HENT:     Head: Normocephalic.     Nose: Nose normal.  Eyes:     Conjunctiva/sclera: Conjunctivae normal.  Pulmonary:     Effort: Pulmonary effort is normal.  Musculoskeletal:     Right knee: Swelling and effusion present. No crepitus. Tenderness present. Normal pulse.     Left knee: Normal. Normal pulse.  Neurological:     Mental Status: He is alert.  Psychiatric:        Mood and Affect: Mood normal.     ED Results / Procedures / Treatments   Labs (all labs ordered are listed, but only abnormal results are displayed) Labs Reviewed - No data to display  EKG None  Radiology CT Knee Right Wo Contrast  Result Date: 04/20/2023 CLINICAL DATA:  Right knee pain after fall. EXAM: CT OF THE RIGHT KNEE WITHOUT CONTRAST TECHNIQUE: Multidetector CT imaging of the right knee was performed according to the standard protocol. Multiplanar CT image reconstructions were also generated. RADIATION DOSE REDUCTION: This exam was performed according to the departmental dose-optimization program which includes automated exposure control, adjustment of the mA and/or kV according to patient size and/or use of iterative  reconstruction technique. COMPARISON:  Radiographs of same day. FINDINGS: Vascular calcifications are noted. Moderate degenerative changes seen involving the medial and lateral joint spaces with chondrocalcinosis. Small suprapatellar joint effusion is noted. No fracture or dislocation is noted. IMPRESSION: No fracture or dislocation is noted. Moderate degenerative joint disease is noted. Small suprapatellar joint effusion. Electronically Signed   By: Lupita Raider M.D.   On: 04/20/2023 17:55   DG Knee Complete 4 Views Right  Result Date: 04/20/2023 CLINICAL DATA:  Fall.  Swelling in the right knee. EXAM: RIGHT KNEE - COMPLETE 4+ VIEW COMPARISON:  None Available. FINDINGS: No evidence of fracture or dislocation. Small suprapatellar knee joint effusion. Tricompartmental knee joint space narrowing with marginal osteophytes prominent in  the patellofemoral compartment. Chondrocalcinosis. Prominent vascular calcifications. IMPRESSION: 1. No evidence of fracture or dislocation. 2. Small suprapatellar knee joint effusion. 3. Moderate tricompartmental knee osteoarthritis. Electronically Signed   By: Larose Hires D.O.   On: 04/20/2023 15:53    Procedures Procedures    Medications Ordered in ED Medications - No data to display  ED Course/ Medical Decision Making/ A&P                             Medical Decision Making Amount and/or Complexity of Data Reviewed Radiology: ordered.   72 year old well-appearing male presenting for mechanical fall.  Exam notable for swelling and tenderness about the right knee.  X-ray revealed suprapatellar knee joint effusion.  This prompted concern for possible tibial plateau fracture.  CT scan was negative fortunately.  Cannot rule out ligamentous or other soft tissue injury.  Spoke to Earney Hamburg, PA who advised to immobilize the knee and have him follow-up.  Patient refused pain medicine.  Applied knee immobilizer.  Vitals stable.  Discharged home in good  condition.    Final Clinical Impression(s) / ED Diagnoses Final diagnoses:  Knee effusion, right    Rx / DC Orders ED Discharge Orders     None         Gareth Eagle, PA-C 04/20/23 1811    Gareth Eagle, PA-C 04/20/23 1812    Gerhard Munch, MD 04/24/23 [phone]

## 2023-04-20 NOTE — Discharge Instructions (Signed)
Evaluation today for your knee revealed you do have a small effusion overlying your right knee this is probably causing the swelling.  I cannot rule out a ligament or other soft tissue injury around the knee which is why I am treating you with a knee immobilizer.  Recommend you do follow-up with orthopedics for further evaluation in the outpatient setting.

## 2023-04-20 NOTE — Progress Notes (Signed)
Orthopedic Tech Progress Note Patient Details:  Jacob [name] September 16, 1951 [id number]  Knee immobilizer placed to RLE. Pt was educated on benefits and reasoning for the device being ordered.   Ortho Devices Type of Ortho Device: Knee Immobilizer Ortho Device/Splint Location: RLE Ortho Device/Splint Interventions: Ordered, Application, Adjustment   Post Interventions Patient Tolerated: Fair Instructions Provided: Care of device, Adjustment of device  [name] Carmine Savoy 04/20/2023, 8:09 PM

## 2023-04-20 NOTE — ED Notes (Signed)
Given patient crutches and patient able to walk without assist with them. Tolerated well. Assisted with taxi back home. Pt placed into lobby in wheelchair.

## 2023-04-21 ENCOUNTER — Telehealth: Payer: Self-pay

## 2023-04-21 ENCOUNTER — Ambulatory Visit: Payer: 59

## 2023-04-21 ENCOUNTER — Other Ambulatory Visit: Payer: Self-pay

## 2023-04-21 DIAGNOSIS — N186 End stage renal disease: Secondary | ICD-10-CM | POA: Diagnosis not present

## 2023-04-21 DIAGNOSIS — D631 Anemia in chronic kidney disease: Secondary | ICD-10-CM | POA: Diagnosis not present

## 2023-04-21 DIAGNOSIS — Z992 Dependence on renal dialysis: Secondary | ICD-10-CM | POA: Diagnosis not present

## 2023-04-21 DIAGNOSIS — N2581 Secondary hyperparathyroidism of renal origin: Secondary | ICD-10-CM | POA: Diagnosis not present

## 2023-04-21 NOTE — Patient Instructions (Signed)
Visit Information  Thank you for taking time to visit with me today. Please don't hesitate to contact me if I can be of assistance to you.   Following are the goals we discussed today:   Goals Addressed             This Visit's Progress    To have right knee injury evaluated       Care Coordination Interventions: Received an inbound call from patient advising he experienced a fall in his home after tripping over his own foot Determined patient landed on his right knee causing a severe contusion resulting in an ED visit Review of patient status, including review of consultant's reports, relevant laboratory and other test results, and medications completed Determined patient is having severe pain and was unable to wear the knee immobilizer as directed due to the extreme swelling to his knee  Educated patient about [facility], provided patient the contact number, address and hours of operation and encouraged him to get his right knee re-evaluated when possible  Educated patient on the benefits of elevating his knee while resting, instructed to apply ice packs to the knee, for 20 minutes on/ 20 minutes off intervals  Determined patient will follow the given instructions and consider going to [facility] for further evaluation           Our next appointment is by telephone on 04/25/23 at 10:00 AM  Please call the care guide team at [phone] if you need to cancel or reschedule your appointment.   If you are experiencing a Mental Health or Behavioral Health Crisis or need someone to talk to, please call 1-800-273-TALK (toll free, 24 hour hotline)  The patient verbalized understanding of instructions, educational materials, and care plan provided today and DECLINED offer to receive copy of patient instructions, educational materials, and care plan.   Delsa Sale, RN, BSN, CCM Care Management Coordinator [facility] Care Management  Direct Phone: [phone]

## 2023-04-21 NOTE — Telephone Encounter (Addendum)
Patient called this afternoon to ask for a referral from [name], MD to have a Home Health Nurse Aid from [facility] because he just got out of the hospital on Wednesday from a fall.   Message sent to [name], MD

## 2023-04-21 NOTE — Patient Outreach (Signed)
  Care Coordination   Follow Up Visit Note   04/21/2023 Name: Jacob [name] MRN: [id number] DOB: [birth year]  Jacob [name] is a 72 y.o. year old male who sees [name], MD for primary care. I spoke with  David Stall by phone today.  What matters to the patients health and wellness today?  Patient would like to have an Orthopedic provider evaluate his right knee injury.     Goals Addressed             This Visit's Progress    To have right knee injury evaluated       Care Coordination Interventions: Received an inbound call from patient advising he experienced a fall in his home after tripping over his own foot Determined patient landed on his right knee causing a severe contusion resulting in an ED visit Review of patient status, including review of consultant's reports, relevant laboratory and other test results, and medications completed Determined patient is having severe pain and was unable to wear the knee immobilizer as directed due to the extreme swelling to his knee  Educated patient about [facility], provided patient the contact number, address and hours of operation and encouraged him to get his right knee re-evaluated when possible  Educated patient on the benefits of elevating his knee while resting, instructed to apply ice packs to the knee, for 20 minutes on/ 20 minutes off intervals  Determined patient will follow the given instructions and consider going to [facility] for further evaluation       Interventions Today    Flowsheet Row Most Recent Value  Chronic Disease   Chronic disease during today's visit Other  [s/p fall w/right knee injury]  General Interventions   General Interventions Discussed/Reviewed General Interventions Discussed, General Interventions Reviewed, Doctor Visits  Doctor Visits Discussed/Reviewed Doctor Visits Reviewed, Doctor Visits Discussed, Specialist  Education Interventions   Education Provided Provided Education   Provided Verbal Education On When to see the doctor  Pharmacy Interventions   Pharmacy Dicussed/Reviewed Pharmacy Topics Discussed, Pharmacy Topics Reviewed  Safety Interventions   Safety Discussed/Reviewed Safety Reviewed, Safety Discussed, Fall Risk, Home Safety  Home Safety Assistive Devices          SDOH assessments and interventions completed:  No     Care Coordination Interventions:  Yes, provided   Follow up plan: Follow up call scheduled for 04/25/23   @10 :00 AM  Encounter Outcome:  Pt. Visit Completed

## 2023-04-23 DIAGNOSIS — N186 End stage renal disease: Secondary | ICD-10-CM | POA: Diagnosis not present

## 2023-04-23 DIAGNOSIS — I129 Hypertensive chronic kidney disease with stage 1 through stage 4 chronic kidney disease, or unspecified chronic kidney disease: Secondary | ICD-10-CM | POA: Diagnosis not present

## 2023-04-23 DIAGNOSIS — Z992 Dependence on renal dialysis: Secondary | ICD-10-CM | POA: Diagnosis not present

## 2023-04-24 ENCOUNTER — Other Ambulatory Visit (HOSPITAL_COMMUNITY): Payer: Self-pay

## 2023-04-24 ENCOUNTER — Other Ambulatory Visit: Payer: Self-pay

## 2023-04-24 DIAGNOSIS — N2581 Secondary hyperparathyroidism of renal origin: Secondary | ICD-10-CM | POA: Diagnosis not present

## 2023-04-24 DIAGNOSIS — Z992 Dependence on renal dialysis: Secondary | ICD-10-CM | POA: Diagnosis not present

## 2023-04-24 DIAGNOSIS — N186 End stage renal disease: Secondary | ICD-10-CM | POA: Diagnosis not present

## 2023-04-24 DIAGNOSIS — E1122 Type 2 diabetes mellitus with diabetic chronic kidney disease: Secondary | ICD-10-CM | POA: Diagnosis not present

## 2023-04-24 DIAGNOSIS — D631 Anemia in chronic kidney disease: Secondary | ICD-10-CM | POA: Diagnosis not present

## 2023-04-25 ENCOUNTER — Telehealth: Payer: Self-pay

## 2023-04-25 ENCOUNTER — Other Ambulatory Visit: Payer: Self-pay

## 2023-04-25 ENCOUNTER — Ambulatory Visit: Payer: Self-pay

## 2023-04-25 DIAGNOSIS — D649 Anemia, unspecified: Secondary | ICD-10-CM

## 2023-04-25 DIAGNOSIS — H401134 Primary open-angle glaucoma, bilateral, indeterminate stage: Secondary | ICD-10-CM | POA: Diagnosis not present

## 2023-04-25 DIAGNOSIS — H02051 Trichiasis without entropian right upper eyelid: Secondary | ICD-10-CM | POA: Diagnosis not present

## 2023-04-25 DIAGNOSIS — Q2730 Arteriovenous malformation, site unspecified: Secondary | ICD-10-CM

## 2023-04-25 NOTE — Telephone Encounter (Signed)
Octreotide 10 mg injection hospital order in epic.   Lm on home vm for patient to return call.

## 2023-04-25 NOTE — Patient Instructions (Signed)
Visit Information  Thank you for taking time to visit with me today. Please don't hesitate to contact me if I can be of assistance to you.   Following are the goals we discussed today:   Goals Addressed             This Visit's Progress    To have right knee injury evaluated       Care Coordination Interventions: Advised patient of importance of notifying provider of falls Assessed for falls since last encounter Assessed patients knowledge of fall risk prevention secondary to previously provided education Determined patient was unable to get into [facility] for evaluation of his right knee Reviewed and discussed with patient he is scheduled to see an Orthopedist with this group on 05/11/23 @1 :00 PM Determined patient feels the swelling has improved and he is able to bare weight on this extremity, he continues to use a WC to travel longer distances outside of his home  Determined patient is getting good pain relief from his prescribed pain medication            Our next appointment is by telephone on 05/23/23 at 11:00 AM  Please call the care guide team at [phone] if you need to cancel or reschedule your appointment.   If you are experiencing a Mental Health or Behavioral Health Crisis or need someone to talk to, please call 1-800-273-TALK (toll free, 24 hour hotline)  The patient verbalized understanding of instructions, educational materials, and care plan provided today and DECLINED offer to receive copy of patient instructions, educational materials, and care plan.   Delsa Sale, RN, BSN, CCM Care Management Coordinator [facility] Care Management  Direct Phone: [phone]

## 2023-04-25 NOTE — Patient Outreach (Signed)
  Care Coordination   Follow Up Visit Note   04/25/2023 Name: IAAN KRABILL MRN: [id number] DOB: 14-Apr-1951  SHADOW ENSING is a 72 y.o. year old male who sees [name], MD for primary care. I spoke with  David Stall by phone today.  What matters to the patients health and wellness today?  Patient would like to have his right knee further evaluated and treated.     Goals Addressed             This Visit's Progress    To have right knee injury evaluated       Care Coordination Interventions: Advised patient of importance of notifying provider of falls Assessed for falls since last encounter Assessed patients knowledge of fall risk prevention secondary to previously provided education Determined patient was unable to get into [facility] for evaluation of his right knee Reviewed and discussed with patient he is scheduled to see an Orthopedist with this group on 05/11/23 @1 :00 PM Determined patient feels the swelling has improved and he is able to bare weight on this extremity, he continues to use a WC to travel longer distances outside of his home  Determined patient is getting good pain relief from his prescribed pain medication     Interventions Today    Flowsheet Row Most Recent Value  Chronic Disease   Chronic disease during today's visit Other  [right knee pain]  General Interventions   General Interventions Discussed/Reviewed General Interventions Discussed, General Interventions Reviewed, Doctor Visits  Doctor Visits Discussed/Reviewed Doctor Visits Discussed, Doctor Visits Reviewed, Specialist  Education Interventions   Education Provided Provided Education  Provided Verbal Education On When to see the doctor, Medication, Exercise  Pharmacy Interventions   Pharmacy Dicussed/Reviewed Pharmacy Topics Discussed, Pharmacy Topics Reviewed, Medications and their functions  Safety Interventions   Safety Discussed/Reviewed Fall Risk  Home Safety Assistive Devices           SDOH assessments and interventions completed:  No     Care Coordination Interventions:  Yes, provided   Follow up plan: Follow up call scheduled for 05/23/23 @1 :00 PM     Encounter Outcome:  Pt. Visit Completed

## 2023-04-25 NOTE — Patient Outreach (Signed)
  Care Coordination   04/25/2023 Name: Jacob [name] MRN: [id number] DOB: Nov 21, 1950   Care Coordination Outreach Attempts:  An unsuccessful telephone outreach was attempted today to offer the patient information about available care coordination services.  Follow Up Plan:  Additional outreach attempts will be made to offer the patient care coordination information and services.   Encounter Outcome:  No Answer   Care Coordination Interventions:  No, not indicated    Delsa Sale, RN, BSN, CCM Care Management Coordinator [facility] Care Management  Direct Phone: [phone]

## 2023-04-26 DIAGNOSIS — N2581 Secondary hyperparathyroidism of renal origin: Secondary | ICD-10-CM | POA: Diagnosis not present

## 2023-04-26 DIAGNOSIS — D631 Anemia in chronic kidney disease: Secondary | ICD-10-CM | POA: Diagnosis not present

## 2023-04-26 DIAGNOSIS — E1122 Type 2 diabetes mellitus with diabetic chronic kidney disease: Secondary | ICD-10-CM | POA: Diagnosis not present

## 2023-04-26 DIAGNOSIS — N186 End stage renal disease: Secondary | ICD-10-CM | POA: Diagnosis not present

## 2023-04-26 DIAGNOSIS — Z992 Dependence on renal dialysis: Secondary | ICD-10-CM | POA: Diagnosis not present

## 2023-04-28 ENCOUNTER — Encounter (HOSPITAL_COMMUNITY): Payer: 59

## 2023-04-28 DIAGNOSIS — Z992 Dependence on renal dialysis: Secondary | ICD-10-CM | POA: Diagnosis not present

## 2023-04-28 DIAGNOSIS — N186 End stage renal disease: Secondary | ICD-10-CM | POA: Diagnosis not present

## 2023-04-28 DIAGNOSIS — N2581 Secondary hyperparathyroidism of renal origin: Secondary | ICD-10-CM | POA: Diagnosis not present

## 2023-04-28 DIAGNOSIS — E1122 Type 2 diabetes mellitus with diabetic chronic kidney disease: Secondary | ICD-10-CM | POA: Diagnosis not present

## 2023-04-28 DIAGNOSIS — D631 Anemia in chronic kidney disease: Secondary | ICD-10-CM | POA: Diagnosis not present

## 2023-04-28 NOTE — Telephone Encounter (Signed)
Called and spoke with patient to schedule his follow up appt. Pt states that he is currently at dialysis and will call back once he gets home and has a chance to look at his calendar.

## 2023-04-29 IMAGING — MR MR LUMBAR SPINE WO/W CM
5 of 6 series · 30 of 48 positions shown · IV contrast (gadavist)
Comparison: Radiography 09/08/2020.  MRI 12/29/2015

CLINICAL DATA: Dialysis patient.  Low back pain.

EXAM:
MRI LUMBAR SPINE WITHOUT AND WITH CONTRAST
TECHNIQUE: Multiplanar and multiecho pulse sequences of the lumbar spine were
obtained without and with intravenous contrast.
CONTRAST:  7.5mL GADAVIST GADOBUTROL 1 MMOL/ML IV SOLN

[Series 3: T1 · sagittal · 4.0mm · 0.88mm/px · 4 of 16 slices shown (1 of 2)]
[im 1/16]
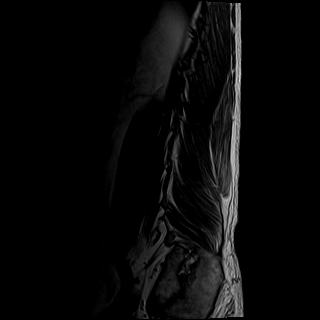
[im 6/16]
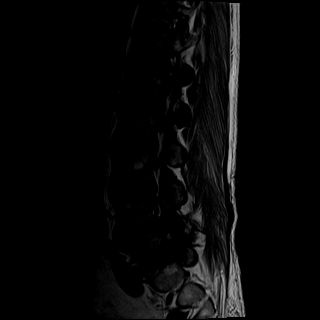
[im 11/16]
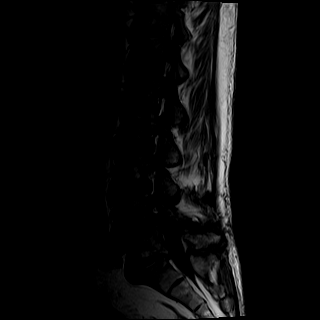
[im 16/16]
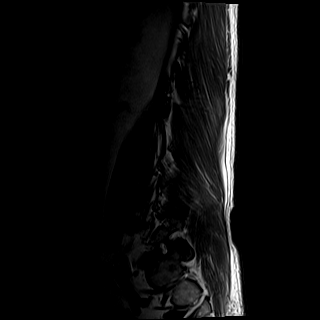

[Series 5: T2 · axial · 4.0mm · 0.39mm/px · z∈[-127,+83]mm · 9 of 41 slices shown (1 of 2)]
[im 1/41]
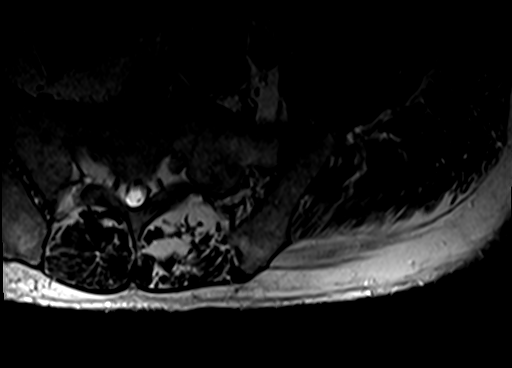
[im 8/41]
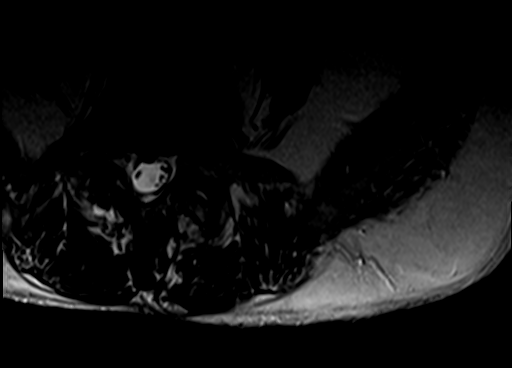
[im 11/41]
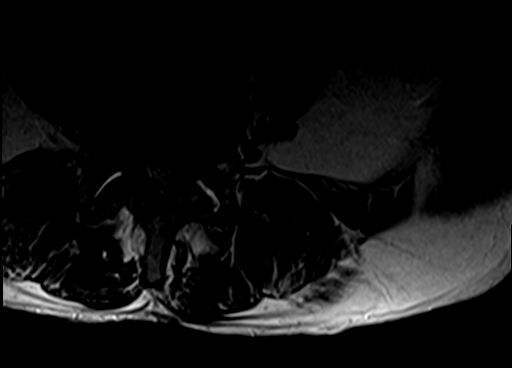
[im 19/41]
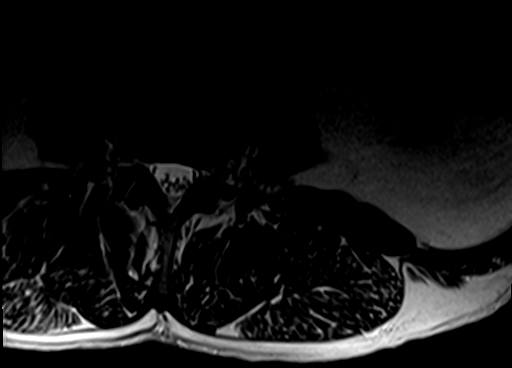
[im 22/41]
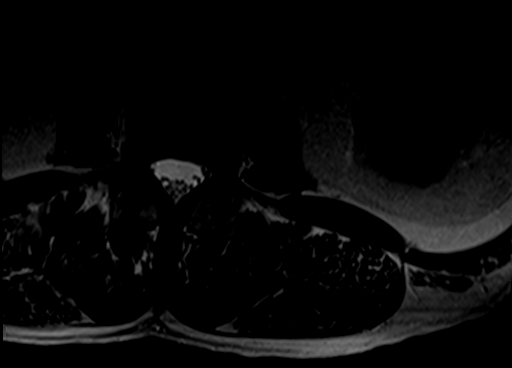
[im 30/41]
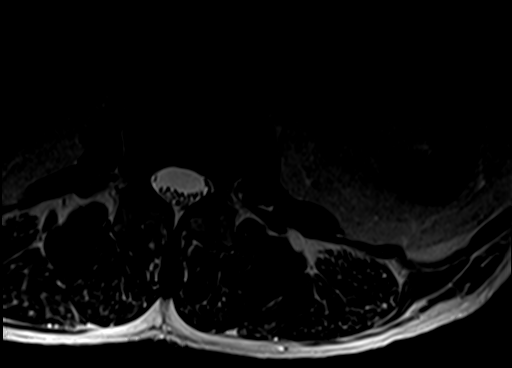
[im 33/41]
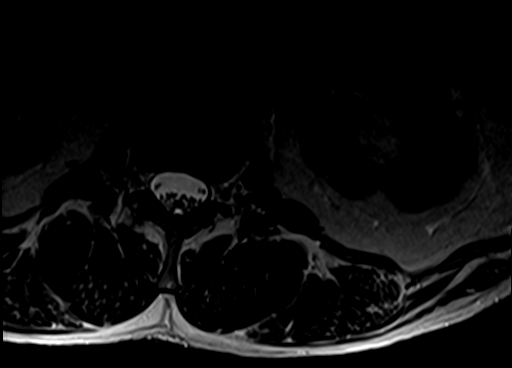
[im 37/41]
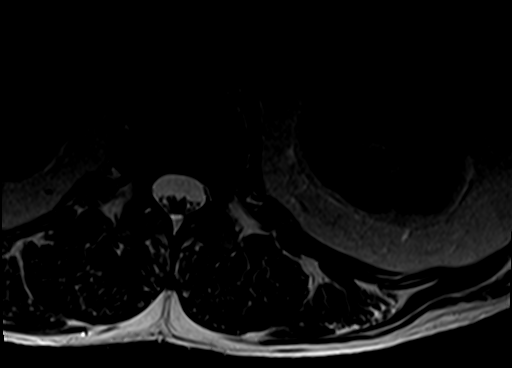
[im 41/41]
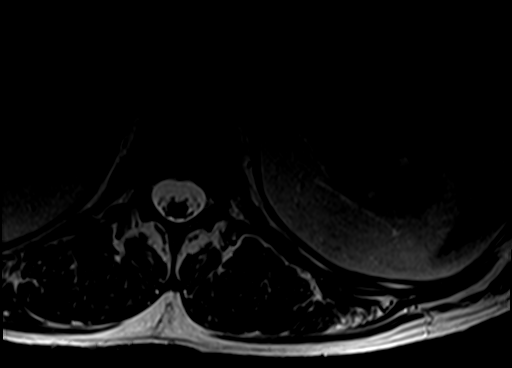

[Series 6: T1 · axial · 4.0mm · 0.39mm/px · z∈[-123,+87]mm · 9 of 41 slices shown (2 of 2)]
[im 1/41]
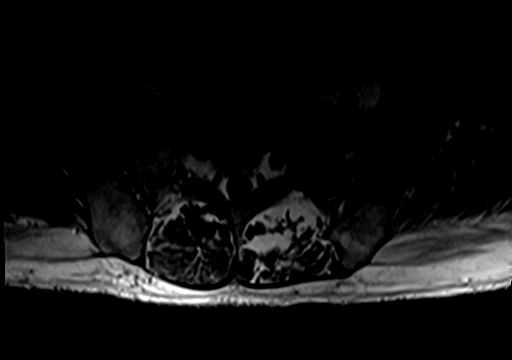
[im 8/41]
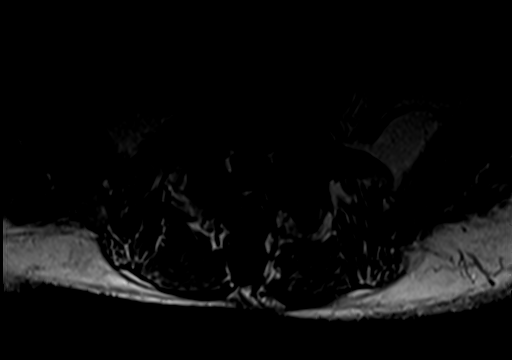
[im 11/41]
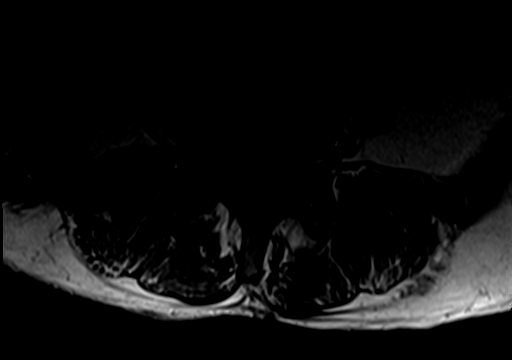
[im 19/41]
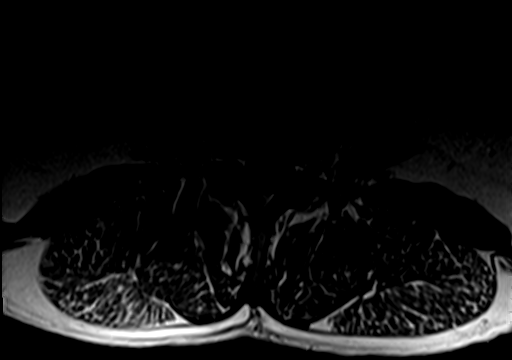
[im 22/41]
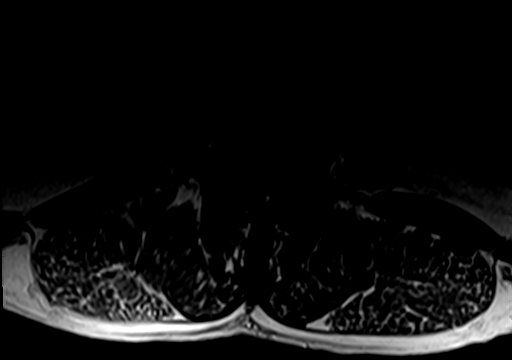
[im 30/41]
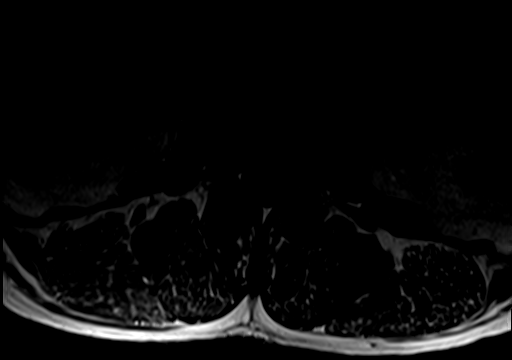
[im 33/41]
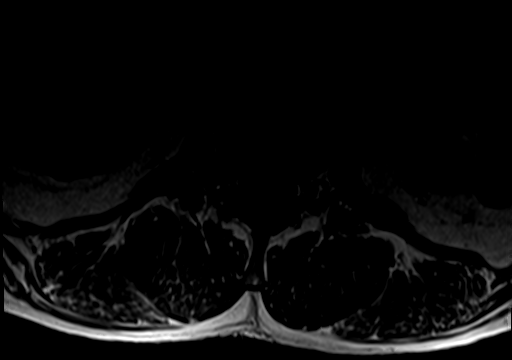
[im 37/41]
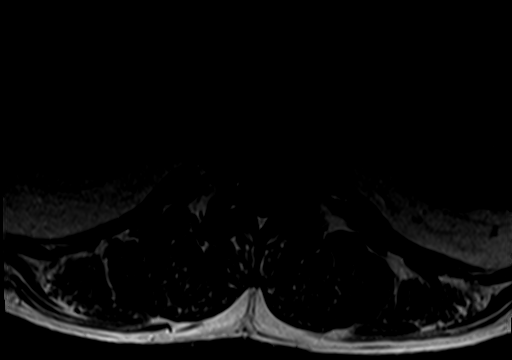
[im 41/41]
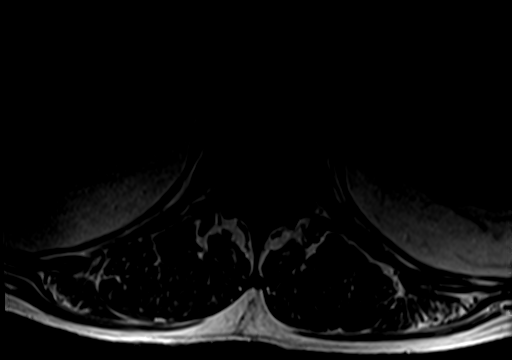

[Series 8: T2 · sagittal · 4.0mm · 1.09mm/px · 4 of 16 slices shown (2 of 2)]
[im 1/16]
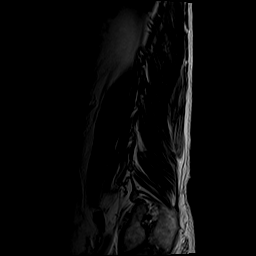
[im 6/16]
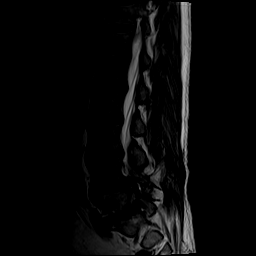
[im 11/16]
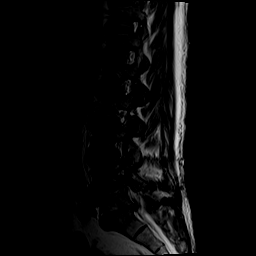
[im 16/16]
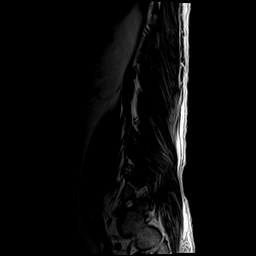

[Series 9: T1 fat-sat · sagittal · 4.0mm · 0.88mm/px · 4 of 16 slices shown]
[im 1/16]
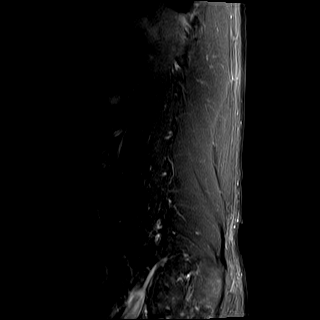
[im 6/16]
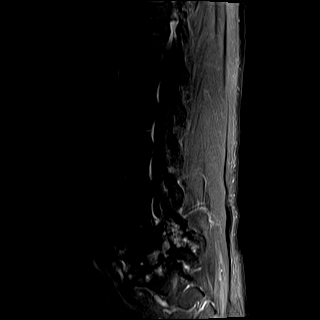
[im 11/16]
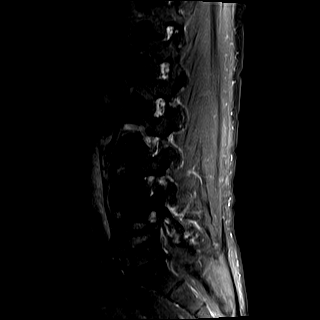
[im 16/16]
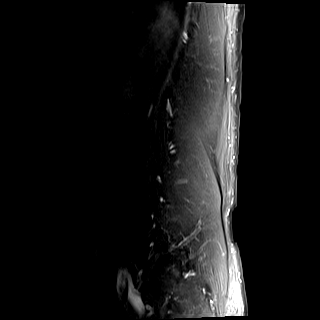

[30 of 48 positions shown; findings below may reference images not displayed]

FINDINGS: Segmentation: 5 lumbar type vertebral bodies as numbered previously.

Alignment: 5 mm degenerative anterolisthesis at L4-5, increased a mm
or 2 since 0709.

Vertebrae: Discogenic endplate marrow changes at L4-5 and L5-S1 with
mild edema and enhancement could contribute to low back pain.

Conus medullaris and cauda equina: Conus extends to the L1 level.
Conus and cauda equina appear normal.

Paraspinal and other soft tissues: Small renal cysts. No acute
finding.

Disc levels:

T11-12 and T12-L1: Normal

L1-2 and L2-3: Minimal disc bulges.  No stenosis.

L3-4: Minimal disc bulge. Mild facet and ligamentous hypertrophy.
Mild narrowing of the lateral recesses but no apparent neural
compression. The facet arthritis could contribute to low back pain.
The facet arthritis has worsened slightly since 0709.

L4-5: Bilateral facet degeneration with facet and ligamentous
hypertrophy. 5 mm of anterolisthesis, increased from about 3 mm in
0709. Worsened disc degeneration with broad-based herniation of the
disc. Multifactorial spinal stenosis at this level that could cause
neural compression on either or both sides. Bilateral foraminal
stenosis could affect the exiting L4 nerves as well. Synovial cyst
previously seen associated with the facet on the left is not clearly
recognizable.

L5-S1: Disc degeneration with loss of disc height. Endplate
osteophytes and shallow protrusion of the disc. Facet degeneration
and hypertrophy. Stenosis of both subarticular lateral recesses and
neural foramina could cause neural compression on either or both
sides. Findings have worsened since the previous exam.
IMPRESSION: L3-4: Mild bulging of the disc. Facet degeneration and hypertrophy
which is progressive since 0709. No sign of neural compressive
stenosis. The facet arthritis could contribute to low back pain.

L4-5: Worsening of disease at this level. Bilateral facet
degeneration and hypertrophy with anterolisthesis of 5 mm, increased
from 3 mm previously. Broad-based herniation of the disc. Stenosis
of the canal, lateral recesses and neural foramina could cause
neural compression on either or both sides. Discogenic endplate
marrow changes could relate to low back pain. The facet arthropathy
could relate to low back pain.

L5-S1: Worsening of disease at this level. Disc degeneration with
loss of disc height, endplate osteophytes and shallow protrusion of
the disc. Facet degeneration and hypertrophy. Stenosis of the
subarticular lateral recesses and neural foramina that could cause
neural compression on either or both sides. Endplate edematous
changes could relate to low back pain.

## 2023-05-01 ENCOUNTER — Other Ambulatory Visit: Payer: Self-pay

## 2023-05-01 DIAGNOSIS — E1122 Type 2 diabetes mellitus with diabetic chronic kidney disease: Secondary | ICD-10-CM | POA: Diagnosis not present

## 2023-05-01 DIAGNOSIS — N186 End stage renal disease: Secondary | ICD-10-CM | POA: Diagnosis not present

## 2023-05-01 DIAGNOSIS — Z992 Dependence on renal dialysis: Secondary | ICD-10-CM | POA: Diagnosis not present

## 2023-05-01 DIAGNOSIS — D631 Anemia in chronic kidney disease: Secondary | ICD-10-CM | POA: Diagnosis not present

## 2023-05-01 DIAGNOSIS — N2581 Secondary hyperparathyroidism of renal origin: Secondary | ICD-10-CM | POA: Diagnosis not present

## 2023-05-01 NOTE — Telephone Encounter (Signed)
Patient returning call. Please advise

## 2023-05-01 NOTE — Telephone Encounter (Signed)
Jacob [name], pt just needs to be scheduled for an office visit with Dr. Adela Lank. Next available is fine. Thanks

## 2023-05-02 ENCOUNTER — Ambulatory Visit (HOSPITAL_COMMUNITY)
Admission: RE | Admit: 2023-05-02 | Discharge: 2023-05-02 | Disposition: A | Discharge status: Home / Self Care | Payer: 59 | Source: Ambulatory Visit | Attending: Gastroenterology | Admitting: Gastroenterology

## 2023-05-02 DIAGNOSIS — D649 Anemia, unspecified: Secondary | ICD-10-CM | POA: Diagnosis not present

## 2023-05-02 DIAGNOSIS — Q2730 Arteriovenous malformation, site unspecified: Secondary | ICD-10-CM | POA: Diagnosis not present

## 2023-05-02 MED ORDER — OCTREOTIDE ACETATE 10 MG IM KIT
10.0000 mg | PACK | INTRAMUSCULAR | Status: DC
  Administered 2023-05-02: 10 mg via INTRAMUSCULAR
  Filled 2023-05-02: qty 1

## 2023-05-03 ENCOUNTER — Other Ambulatory Visit: Payer: Self-pay

## 2023-05-03 DIAGNOSIS — N186 End stage renal disease: Secondary | ICD-10-CM | POA: Diagnosis not present

## 2023-05-03 DIAGNOSIS — N2581 Secondary hyperparathyroidism of renal origin: Secondary | ICD-10-CM | POA: Diagnosis not present

## 2023-05-03 DIAGNOSIS — Z992 Dependence on renal dialysis: Secondary | ICD-10-CM | POA: Diagnosis not present

## 2023-05-03 DIAGNOSIS — D631 Anemia in chronic kidney disease: Secondary | ICD-10-CM | POA: Diagnosis not present

## 2023-05-03 DIAGNOSIS — E1122 Type 2 diabetes mellitus with diabetic chronic kidney disease: Secondary | ICD-10-CM | POA: Diagnosis not present

## 2023-05-03 MED ORDER — OXYCODONE-ACETAMINOPHEN 5-325 MG PO TABS: 1.0000 | ORAL_TABLET | Freq: Four times a day (QID) | ORAL | 0 refills | Status: DC | PRN

## 2023-05-03 NOTE — Telephone Encounter (Signed)
Patient scheduled for 10/29

## 2023-05-03 NOTE — Addendum Note (Signed)
Addended byRicharda Blade C on: 05/03/2023 05:04 PM   Modules accepted: Orders

## 2023-05-03 NOTE — Telephone Encounter (Addendum)
Patient called to emphasize that he really needs this rx sent in to the correct pharmacy. Patient asked that I send it to Richarda Blade, NP to see if she will address in Dr.Miller absence    Patient states the request for a wheelchair can wait for Dr.Miller to address

## 2023-05-03 NOTE — Telephone Encounter (Signed)
Patient called requesting refill on oxycodone.Medication last refilled 03/30/23.Treatment agreement on file and up to date.  Medication has been pended and sent to Dr. Jacalyn Lefevre.  Patient would also like to get an order for a wheelchair.

## 2023-05-03 NOTE — Telephone Encounter (Signed)
Medication sent to incorrect pharmacy. Pharmacy has ben updated and medication pended and sent to Dr. Jacalyn Lefevre

## 2023-05-03 NOTE — Addendum Note (Signed)
Addended by: Elveria Royals on: 05/03/2023 11:32 AM   Modules accepted: Orders

## 2023-05-04 ENCOUNTER — Ambulatory Visit: Payer: Self-pay

## 2023-05-04 NOTE — Progress Notes (Signed)
Patient called to say he no longer needs Dr.Miller to address the need for the wheelchair. Patient will see Dr. Carlean Jews on 05/11/23 and he will provide the order for a wheelchair.

## 2023-05-04 NOTE — Patient Outreach (Signed)
  Care Coordination   Follow Up Visit Note   05/04/2023 Name: Jacob [name] MRN: [id number] DOB: 05-11-1951  Jacob [name] is a 72 y.o. year old male who sees [name], MD for primary care. I spoke with  David Stall by phone today.  What matters to the patients health and wellness today?  Patient would like to obtain an Rx for a manual W/C.     Goals Addressed             This Visit's Progress    To have right knee injury evaluated       Care Coordination Interventions: Advised patient of importance of notifying provider of falls Assessed for falls since last encounter Assessed patients knowledge of fall risk prevention secondary to previously provided education Discussed with patient he is in need of a manual W/C Determined patient contacted his PCP office to request an Rx for the W/C, however Dr. Hyacinth Meeker is out of the office this week and he did not receive a call back from the office Reviewed and discussed with patient his upcoming Orthopedic visit with Dr. Carlean Jews scheduled for 05/11/23 @1 :00 PM, patient verbalizes he will keep this appointment for further evaluation and treatment of his right knee pain  Discussed with patient that he will discuss getting an Rx for a manual W/C from the Orthopedic MD Instructed patient to contact this RN if further assistance is needed to help obtain a manual W/C and patient verbalizes understanding     Interventions Today    Flowsheet Row Most Recent Value  Chronic Disease   Chronic disease during today's visit Other  [s/p fall, right knee]  General Interventions   General Interventions Discussed/Reviewed General Interventions Discussed, General Interventions Reviewed, Doctor Visits, Durable Medical Equipment (DME)  Doctor Visits Discussed/Reviewed Doctor Visits Discussed, Doctor Visits Reviewed, Specialist  Durable Medical Equipment (DME) Wheelchair  [crutches]  Wheelchair Standard  Exercise Interventions   Exercise  Discussed/Reviewed Assistive device use and maintanence  Education Interventions   Education Provided Provided Education  Provided Verbal Education On When to see the doctor  Safety Interventions   Safety Discussed/Reviewed Safety Reviewed, Fall Risk, Home Safety, Safety Discussed  Home Safety Assistive Devices          SDOH assessments and interventions completed:  No     Care Coordination Interventions:  Yes, provided   Follow up plan: Follow up call scheduled for 05/16/23 @1 :30 PM    Encounter Outcome:  Pt. Visit Completed

## 2023-05-04 NOTE — Patient Instructions (Signed)
Visit Information  Thank you for taking time to visit with me today. Please don't hesitate to contact me if I can be of assistance to you.   Following are the goals we discussed today:   Goals Addressed             This Visit's Progress    To have right knee injury evaluated       Care Coordination Interventions: Advised patient of importance of notifying provider of falls Assessed for falls since last encounter Assessed patients knowledge of fall risk prevention secondary to previously provided education Discussed with patient he is in need of a manual W/C Determined patient contacted his PCP office to request an Rx for the W/C, however Dr. Hyacinth Meeker is out of the office this week and he did not receive a call back from the office Reviewed and discussed with patient his upcoming Orthopedic visit with Dr. Carlean Jews scheduled for 05/11/23 @1 :00 PM, patient verbalizes he will keep this appointment for further evaluation and treatment of his right knee pain  Discussed with patient that he will discuss getting an Rx for a manual W/C from the Orthopedic MD Instructed patient to contact this RN if further assistance is needed to help obtain a manual W/C and patient verbalizes understanding         Our next appointment is by telephone on 05/16/23 at 1:30 PM  Please call the care guide team at [phone] if you need to cancel or reschedule your appointment.   If you are experiencing a Mental Health or Behavioral Health Crisis or need someone to talk to, please call 1-800-273-TALK (toll free, 24 hour hotline)  The patient verbalized understanding of instructions, educational materials, and care plan provided today and DECLINED offer to receive copy of patient instructions, educational materials, and care plan.

## 2023-05-05 DIAGNOSIS — D631 Anemia in chronic kidney disease: Secondary | ICD-10-CM | POA: Diagnosis not present

## 2023-05-05 DIAGNOSIS — E1122 Type 2 diabetes mellitus with diabetic chronic kidney disease: Secondary | ICD-10-CM | POA: Diagnosis not present

## 2023-05-05 DIAGNOSIS — N2581 Secondary hyperparathyroidism of renal origin: Secondary | ICD-10-CM | POA: Diagnosis not present

## 2023-05-05 DIAGNOSIS — Z992 Dependence on renal dialysis: Secondary | ICD-10-CM | POA: Diagnosis not present

## 2023-05-05 DIAGNOSIS — N186 End stage renal disease: Secondary | ICD-10-CM | POA: Diagnosis not present

## 2023-05-07 ENCOUNTER — Other Ambulatory Visit: Payer: Self-pay | Admitting: Family Medicine

## 2023-05-08 DIAGNOSIS — N186 End stage renal disease: Secondary | ICD-10-CM | POA: Diagnosis not present

## 2023-05-08 DIAGNOSIS — Z992 Dependence on renal dialysis: Secondary | ICD-10-CM | POA: Diagnosis not present

## 2023-05-08 DIAGNOSIS — D631 Anemia in chronic kidney disease: Secondary | ICD-10-CM | POA: Diagnosis not present

## 2023-05-08 DIAGNOSIS — E1122 Type 2 diabetes mellitus with diabetic chronic kidney disease: Secondary | ICD-10-CM | POA: Diagnosis not present

## 2023-05-08 DIAGNOSIS — N2581 Secondary hyperparathyroidism of renal origin: Secondary | ICD-10-CM | POA: Diagnosis not present

## 2023-05-10 DIAGNOSIS — E1122 Type 2 diabetes mellitus with diabetic chronic kidney disease: Secondary | ICD-10-CM | POA: Diagnosis not present

## 2023-05-10 DIAGNOSIS — N2581 Secondary hyperparathyroidism of renal origin: Secondary | ICD-10-CM | POA: Diagnosis not present

## 2023-05-10 DIAGNOSIS — N186 End stage renal disease: Secondary | ICD-10-CM | POA: Diagnosis not present

## 2023-05-10 DIAGNOSIS — D631 Anemia in chronic kidney disease: Secondary | ICD-10-CM | POA: Diagnosis not present

## 2023-05-10 DIAGNOSIS — Z992 Dependence on renal dialysis: Secondary | ICD-10-CM | POA: Diagnosis not present

## 2023-05-11 DIAGNOSIS — M25571 Pain in right ankle and joints of right foot: Secondary | ICD-10-CM | POA: Diagnosis not present

## 2023-05-11 DIAGNOSIS — M1711 Unilateral primary osteoarthritis, right knee: Secondary | ICD-10-CM | POA: Diagnosis not present

## 2023-05-12 DIAGNOSIS — N2581 Secondary hyperparathyroidism of renal origin: Secondary | ICD-10-CM | POA: Diagnosis not present

## 2023-05-12 DIAGNOSIS — E1122 Type 2 diabetes mellitus with diabetic chronic kidney disease: Secondary | ICD-10-CM | POA: Diagnosis not present

## 2023-05-12 DIAGNOSIS — D631 Anemia in chronic kidney disease: Secondary | ICD-10-CM | POA: Diagnosis not present

## 2023-05-12 DIAGNOSIS — Z992 Dependence on renal dialysis: Secondary | ICD-10-CM | POA: Diagnosis not present

## 2023-05-12 DIAGNOSIS — N186 End stage renal disease: Secondary | ICD-10-CM | POA: Diagnosis not present

## 2023-05-15 ENCOUNTER — Other Ambulatory Visit (HOSPITAL_COMMUNITY): Payer: Self-pay

## 2023-05-15 ENCOUNTER — Other Ambulatory Visit: Payer: Self-pay

## 2023-05-15 DIAGNOSIS — N186 End stage renal disease: Secondary | ICD-10-CM | POA: Diagnosis not present

## 2023-05-15 DIAGNOSIS — N2581 Secondary hyperparathyroidism of renal origin: Secondary | ICD-10-CM | POA: Diagnosis not present

## 2023-05-15 DIAGNOSIS — E1122 Type 2 diabetes mellitus with diabetic chronic kidney disease: Secondary | ICD-10-CM | POA: Diagnosis not present

## 2023-05-15 DIAGNOSIS — Z992 Dependence on renal dialysis: Secondary | ICD-10-CM | POA: Diagnosis not present

## 2023-05-15 DIAGNOSIS — D631 Anemia in chronic kidney disease: Secondary | ICD-10-CM | POA: Diagnosis not present

## 2023-05-16 ENCOUNTER — Telehealth: Payer: 59

## 2023-05-16 ENCOUNTER — Ambulatory Visit: Payer: Self-pay

## 2023-05-16 NOTE — Patient Outreach (Signed)
  Care Coordination   Follow Up Visit Note   05/16/2023 Name: Jacob [name] MRN: [id number] DOB: [birth year]  Jacob [name] is a 72 y.o. year old male who sees [name], MD for primary care. I spoke with  Jacob [name] by phone today.  What matters to the patients health and wellness today?  Patient would like to recover from his knee injury. He is worried about his oldest sister who is now under the care of Hospice.    Goals Addressed             This Visit's Progress    To have right knee injury evaluated       Care Coordination Interventions: Assessed for falls since last encounter Assessed patients knowledge of fall risk prevention secondary to previously provided education Determined patient completed a follow up visit with Orthopedics, he reports having a moderate amount of blood aspirated from his knee  Reviewed and discussed with patient, recommendations for outpatient PT, alternate heat/cold packs Determined patient forgot to ask the provider for a W/C Rx, he plans to call the provider to request assistance with Rx for W/C Counseled patient on the importance of keeping all scheduled appointments as directed Instructed patient to keep his doctor informed of new symptoms or concerns     Interventions Today    Flowsheet Row Most Recent Value  Chronic Disease   Chronic disease during today's visit Other  [right knee pain]  General Interventions   General Interventions Discussed/Reviewed General Interventions Discussed, General Interventions Reviewed, Doctor Visits, Durable Medical Equipment (DME)  Doctor Visits Discussed/Reviewed Doctor Visits Discussed, Doctor Visits Reviewed, Specialist  Durable Medical Equipment (DME) Wheelchair  Wheelchair Standard  Education Interventions   Education Provided Provided Education  Provided Verbal Education On When to see the doctor, Exercise  Mental Health Interventions   Mental Health Discussed/Reviewed Mental Health  Discussed, Mental Health Reviewed, Grief and Loss  [oldest sister under the care of Hospice]  Pharmacy Interventions   Pharmacy Dicussed/Reviewed Pharmacy Topics Discussed, Pharmacy Topics Reviewed, Medications and their functions  Safety Interventions   Safety Discussed/Reviewed Fall Risk, Safety Reviewed, Safety Discussed  Home Safety Assistive Devices            SDOH assessments and interventions completed:  No     Care Coordination Interventions:  Yes, provided   Follow up plan: Follow up call scheduled for 06/27/23 @11 :00 AM    Encounter Outcome:  Pt. Visit Completed

## 2023-05-16 NOTE — Telephone Encounter (Signed)
Patient called to express a concern of diarrhea x 3 episodes. Patient took 2 imodium's and it has helped some, however he questions if Dr.Miller has any additional recommendations. Patient asked if he should be increasing his water intake as well.  Please advise

## 2023-05-16 NOTE — Patient Instructions (Signed)
Visit Information  Thank you for taking time to visit with me today. Please don't hesitate to contact me if I can be of assistance to you.   Following are the goals we discussed today:   Goals Addressed             This Visit's Progress    To have right knee injury evaluated       Care Coordination Interventions: Assessed for falls since last encounter Assessed patients knowledge of fall risk prevention secondary to previously provided education Determined patient completed a follow up visit with Orthopedics, he reports having a moderate amount of blood aspirated from his knee  Reviewed and discussed with patient, recommendations for outpatient PT, alternate heat/cold packs Determined patient forgot to ask the provider for a W/C Rx, he plans to call the provider to request assistance with Rx for W/C Counseled patient on the importance of keeping all scheduled appointments as directed Instructed patient to keep his doctor informed of new symptoms or concerns         Our next appointment is by telephone on 06/27/23 at 11:00 AM  Please call the care guide team at [phone] if you need to cancel or reschedule your appointment.   If you are experiencing a Mental Health or Behavioral Health Crisis or need someone to talk to, please call 1-800-273-TALK (toll free, 24 hour hotline)  The patient verbalized understanding of instructions, educational materials, and care plan provided today and DECLINED offer to receive copy of patient instructions, educational materials, and care plan.   Delsa Sale, RN, BSN, CCM Care Management Coordinator [facility] Care Management  Direct Phone: [phone]

## 2023-05-17 DIAGNOSIS — N186 End stage renal disease: Secondary | ICD-10-CM | POA: Diagnosis not present

## 2023-05-17 DIAGNOSIS — E1122 Type 2 diabetes mellitus with diabetic chronic kidney disease: Secondary | ICD-10-CM | POA: Diagnosis not present

## 2023-05-17 DIAGNOSIS — Z992 Dependence on renal dialysis: Secondary | ICD-10-CM | POA: Diagnosis not present

## 2023-05-17 DIAGNOSIS — D631 Anemia in chronic kidney disease: Secondary | ICD-10-CM | POA: Diagnosis not present

## 2023-05-17 DIAGNOSIS — N2581 Secondary hyperparathyroidism of renal origin: Secondary | ICD-10-CM | POA: Diagnosis not present

## 2023-05-17 NOTE — Telephone Encounter (Signed)
Patient would like Jacob [name] to call him back when she is available

## 2023-05-17 NOTE — Telephone Encounter (Signed)
Duplicate telephone encounter, see other encounter created yesterday.

## 2023-05-17 NOTE — Telephone Encounter (Signed)
Continue Immodium

## 2023-05-17 NOTE — Telephone Encounter (Signed)
Called patient and left a detailed message with Dr.Miller's response. I also suggested that patient return call if he has any additional questions, comments, or concerns.

## 2023-05-18 ENCOUNTER — Other Ambulatory Visit: Payer: Self-pay

## 2023-05-18 MED ORDER — ONETOUCH ULTRA VI STRP: ORAL_STRIP | 3 refills | Status: DC

## 2023-05-19 DIAGNOSIS — Z992 Dependence on renal dialysis: Secondary | ICD-10-CM | POA: Diagnosis not present

## 2023-05-19 DIAGNOSIS — E1122 Type 2 diabetes mellitus with diabetic chronic kidney disease: Secondary | ICD-10-CM | POA: Diagnosis not present

## 2023-05-19 DIAGNOSIS — N2581 Secondary hyperparathyroidism of renal origin: Secondary | ICD-10-CM | POA: Diagnosis not present

## 2023-05-19 DIAGNOSIS — N186 End stage renal disease: Secondary | ICD-10-CM | POA: Diagnosis not present

## 2023-05-19 DIAGNOSIS — D631 Anemia in chronic kidney disease: Secondary | ICD-10-CM | POA: Diagnosis not present

## 2023-05-22 DIAGNOSIS — D631 Anemia in chronic kidney disease: Secondary | ICD-10-CM | POA: Diagnosis not present

## 2023-05-22 DIAGNOSIS — N2581 Secondary hyperparathyroidism of renal origin: Secondary | ICD-10-CM | POA: Diagnosis not present

## 2023-05-22 DIAGNOSIS — N186 End stage renal disease: Secondary | ICD-10-CM | POA: Diagnosis not present

## 2023-05-22 DIAGNOSIS — E1122 Type 2 diabetes mellitus with diabetic chronic kidney disease: Secondary | ICD-10-CM | POA: Diagnosis not present

## 2023-05-22 DIAGNOSIS — Z992 Dependence on renal dialysis: Secondary | ICD-10-CM | POA: Diagnosis not present

## 2023-05-23 ENCOUNTER — Telehealth: Payer: Self-pay | Admitting: Adult Health

## 2023-05-23 ENCOUNTER — Other Ambulatory Visit: Payer: Self-pay

## 2023-05-23 NOTE — Telephone Encounter (Signed)
Pt called over night 1am to report leg swelling on the left. He had a fall and injured his right leg and now has left leg swelling. He was concerned and reports he called the paramedics. They did not transport him to the hospital. He is asking [facility] for advice. I let him know that since there is concern for a blood clot he should go to the emergency room as the office is closed. He reported he did not have transportation. I let him know that he could call our office in the morning and but stated he needed 1 day notice to schedule transportation. At this point I do not have further options for him and reiterated his options of the ER now which is preferred or he can make an apt in the morning. He verbalized understanding.

## 2023-05-24 DIAGNOSIS — N2581 Secondary hyperparathyroidism of renal origin: Secondary | ICD-10-CM | POA: Diagnosis not present

## 2023-05-24 DIAGNOSIS — N186 End stage renal disease: Secondary | ICD-10-CM | POA: Diagnosis not present

## 2023-05-24 DIAGNOSIS — E1122 Type 2 diabetes mellitus with diabetic chronic kidney disease: Secondary | ICD-10-CM | POA: Diagnosis not present

## 2023-05-24 DIAGNOSIS — Z992 Dependence on renal dialysis: Secondary | ICD-10-CM | POA: Diagnosis not present

## 2023-05-24 DIAGNOSIS — I129 Hypertensive chronic kidney disease with stage 1 through stage 4 chronic kidney disease, or unspecified chronic kidney disease: Secondary | ICD-10-CM | POA: Diagnosis not present

## 2023-05-24 DIAGNOSIS — D631 Anemia in chronic kidney disease: Secondary | ICD-10-CM | POA: Diagnosis not present

## 2023-05-26 DIAGNOSIS — D631 Anemia in chronic kidney disease: Secondary | ICD-10-CM | POA: Diagnosis not present

## 2023-05-26 DIAGNOSIS — N186 End stage renal disease: Secondary | ICD-10-CM | POA: Diagnosis not present

## 2023-05-26 DIAGNOSIS — Z992 Dependence on renal dialysis: Secondary | ICD-10-CM | POA: Diagnosis not present

## 2023-05-26 DIAGNOSIS — N2581 Secondary hyperparathyroidism of renal origin: Secondary | ICD-10-CM | POA: Diagnosis not present

## 2023-05-29 DIAGNOSIS — D631 Anemia in chronic kidney disease: Secondary | ICD-10-CM | POA: Diagnosis not present

## 2023-05-29 DIAGNOSIS — N186 End stage renal disease: Secondary | ICD-10-CM | POA: Diagnosis not present

## 2023-05-29 DIAGNOSIS — N2581 Secondary hyperparathyroidism of renal origin: Secondary | ICD-10-CM | POA: Diagnosis not present

## 2023-05-29 DIAGNOSIS — Z992 Dependence on renal dialysis: Secondary | ICD-10-CM | POA: Diagnosis not present

## 2023-05-30 ENCOUNTER — Encounter (HOSPITAL_COMMUNITY): Payer: 59

## 2023-05-31 ENCOUNTER — Telehealth: Payer: Self-pay

## 2023-05-31 DIAGNOSIS — N186 End stage renal disease: Secondary | ICD-10-CM | POA: Diagnosis not present

## 2023-05-31 DIAGNOSIS — D631 Anemia in chronic kidney disease: Secondary | ICD-10-CM | POA: Diagnosis not present

## 2023-05-31 DIAGNOSIS — Z992 Dependence on renal dialysis: Secondary | ICD-10-CM | POA: Diagnosis not present

## 2023-05-31 DIAGNOSIS — N2581 Secondary hyperparathyroidism of renal origin: Secondary | ICD-10-CM | POA: Diagnosis not present

## 2023-05-31 NOTE — Telephone Encounter (Signed)
Patient called to see when he should come back for a follow up appointment and how often is to to do the drug screening.  Message sent to Dr. Jacalyn Lefevre

## 2023-06-01 ENCOUNTER — Ambulatory Visit (HOSPITAL_COMMUNITY)
Admission: RE | Admit: 2023-06-01 | Discharge: 2023-06-01 | Disposition: A | Discharge status: Home / Self Care | Payer: 59 | Source: Ambulatory Visit | Attending: Gastroenterology | Admitting: Gastroenterology

## 2023-06-01 DIAGNOSIS — D649 Anemia, unspecified: Secondary | ICD-10-CM | POA: Insufficient documentation

## 2023-06-01 DIAGNOSIS — Q2730 Arteriovenous malformation, site unspecified: Secondary | ICD-10-CM | POA: Insufficient documentation

## 2023-06-01 MED ORDER — OCTREOTIDE ACETATE 10 MG IM KIT
10.0000 mg | PACK | INTRAMUSCULAR | Status: DC
  Administered 2023-06-01: 10 mg via INTRAMUSCULAR
  Filled 2023-06-01: qty 1

## 2023-06-01 NOTE — Telephone Encounter (Signed)
Patient has scheduled appointment with Dr. Jacquenette Shone for August 27th.

## 2023-06-02 ENCOUNTER — Encounter (HOSPITAL_COMMUNITY): Payer: Self-pay

## 2023-06-02 ENCOUNTER — Encounter: Payer: Self-pay | Admitting: Family Medicine

## 2023-06-02 DIAGNOSIS — D631 Anemia in chronic kidney disease: Secondary | ICD-10-CM | POA: Diagnosis not present

## 2023-06-02 DIAGNOSIS — N186 End stage renal disease: Secondary | ICD-10-CM | POA: Diagnosis not present

## 2023-06-02 DIAGNOSIS — Z992 Dependence on renal dialysis: Secondary | ICD-10-CM | POA: Diagnosis not present

## 2023-06-02 DIAGNOSIS — N2581 Secondary hyperparathyroidism of renal origin: Secondary | ICD-10-CM | POA: Diagnosis not present

## 2023-06-05 ENCOUNTER — Encounter: Payer: Self-pay | Admitting: Family Medicine

## 2023-06-05 ENCOUNTER — Other Ambulatory Visit: Payer: Self-pay | Admitting: *Deleted

## 2023-06-05 DIAGNOSIS — N186 End stage renal disease: Secondary | ICD-10-CM | POA: Diagnosis not present

## 2023-06-05 DIAGNOSIS — I5042 Chronic combined systolic (congestive) and diastolic (congestive) heart failure: Secondary | ICD-10-CM

## 2023-06-05 DIAGNOSIS — Z992 Dependence on renal dialysis: Secondary | ICD-10-CM | POA: Diagnosis not present

## 2023-06-05 DIAGNOSIS — N2581 Secondary hyperparathyroidism of renal origin: Secondary | ICD-10-CM | POA: Diagnosis not present

## 2023-06-05 DIAGNOSIS — D631 Anemia in chronic kidney disease: Secondary | ICD-10-CM | POA: Diagnosis not present

## 2023-06-05 MED ORDER — OXYCODONE-ACETAMINOPHEN 5-325 MG PO TABS: 1.0000 | ORAL_TABLET | Freq: Four times a day (QID) | ORAL | 0 refills | Status: DC | PRN

## 2023-06-05 MED ORDER — ISOSORBIDE MONONITRATE ER 30 MG PO TB24
30.0000 mg | ORAL_TABLET | Freq: Every day | ORAL | 2 refills | Status: DC
Start: 2023-06-05 — End: 2024-04-12

## 2023-06-05 NOTE — Telephone Encounter (Signed)
Patient called requesting refills.   Epic LR: 05/03/2023 Contract Date: 09/07/2022  Pended Rx's and sent to Dr. Hyacinth Meeker for approval.

## 2023-06-06 ENCOUNTER — Encounter: Payer: Self-pay | Admitting: Family Medicine

## 2023-06-07 ENCOUNTER — Other Ambulatory Visit: Payer: Self-pay

## 2023-06-07 DIAGNOSIS — J449 Chronic obstructive pulmonary disease, unspecified: Secondary | ICD-10-CM

## 2023-06-07 DIAGNOSIS — N186 End stage renal disease: Secondary | ICD-10-CM | POA: Diagnosis not present

## 2023-06-07 DIAGNOSIS — D631 Anemia in chronic kidney disease: Secondary | ICD-10-CM | POA: Diagnosis not present

## 2023-06-07 DIAGNOSIS — N2581 Secondary hyperparathyroidism of renal origin: Secondary | ICD-10-CM | POA: Diagnosis not present

## 2023-06-07 DIAGNOSIS — Z992 Dependence on renal dialysis: Secondary | ICD-10-CM | POA: Diagnosis not present

## 2023-06-07 MED ORDER — ANORO ELLIPTA 62.5-25 MCG/ACT IN AEPB: 1.0000 | INHALATION_SPRAY | Freq: Every day | RESPIRATORY_TRACT | 11 refills | Status: DC

## 2023-06-07 NOTE — Telephone Encounter (Signed)
Refill request received from [facility]

## 2023-06-08 ENCOUNTER — Telehealth: Payer: Self-pay

## 2023-06-08 ENCOUNTER — Other Ambulatory Visit (HOSPITAL_COMMUNITY): Payer: Self-pay | Admitting: Orthopedic Surgery

## 2023-06-08 ENCOUNTER — Inpatient Hospital Stay (HOSPITAL_COMMUNITY): Admission: RE | Admit: 2023-06-08 | Payer: 59 | Source: Ambulatory Visit

## 2023-06-08 DIAGNOSIS — M79662 Pain in left lower leg: Secondary | ICD-10-CM | POA: Diagnosis not present

## 2023-06-08 DIAGNOSIS — R609 Edema, unspecified: Secondary | ICD-10-CM

## 2023-06-08 NOTE — Telephone Encounter (Signed)
Continue with regular dose of oxycodone you do not have to increase for doppler ultrasound.

## 2023-06-08 NOTE — Telephone Encounter (Signed)
Patient states that he is scheduled to have a venous doppler of the lower extremities tomorrow and he would like to know if he needs to "slck up" on his oxycodone.  Message sent to Richarda Blade, NP (covering provider)

## 2023-06-09 ENCOUNTER — Ambulatory Visit (HOSPITAL_COMMUNITY)
Admission: RE | Admit: 2023-06-09 | Discharge: 2023-06-09 | Disposition: A | Discharge status: Home / Self Care | Payer: 59 | Source: Ambulatory Visit | Attending: Vascular Surgery | Admitting: Vascular Surgery

## 2023-06-09 DIAGNOSIS — N2581 Secondary hyperparathyroidism of renal origin: Secondary | ICD-10-CM | POA: Diagnosis not present

## 2023-06-09 DIAGNOSIS — N186 End stage renal disease: Secondary | ICD-10-CM | POA: Diagnosis not present

## 2023-06-09 DIAGNOSIS — R609 Edema, unspecified: Secondary | ICD-10-CM | POA: Insufficient documentation

## 2023-06-09 DIAGNOSIS — Z992 Dependence on renal dialysis: Secondary | ICD-10-CM | POA: Diagnosis not present

## 2023-06-09 DIAGNOSIS — D631 Anemia in chronic kidney disease: Secondary | ICD-10-CM | POA: Diagnosis not present

## 2023-06-10 ENCOUNTER — Other Ambulatory Visit: Payer: Self-pay

## 2023-06-12 ENCOUNTER — Telehealth: Payer: Self-pay | Admitting: Cardiovascular Disease

## 2023-06-12 DIAGNOSIS — Z992 Dependence on renal dialysis: Secondary | ICD-10-CM | POA: Diagnosis not present

## 2023-06-12 DIAGNOSIS — N186 End stage renal disease: Secondary | ICD-10-CM | POA: Diagnosis not present

## 2023-06-12 DIAGNOSIS — D631 Anemia in chronic kidney disease: Secondary | ICD-10-CM | POA: Diagnosis not present

## 2023-06-12 DIAGNOSIS — N2581 Secondary hyperparathyroidism of renal origin: Secondary | ICD-10-CM | POA: Diagnosis not present

## 2023-06-12 NOTE — Telephone Encounter (Signed)
Returned pts call. Pt states he has swelling in both knees and calfs. Pt states that about 2 weeks ago he had a procedure done on his knees at the Orthopedic office. He states he just got home from a dialysis appointment and took 2 oxycodone and they feel better. Pt denies SOB, chest pain, chest pressure, recent weight gain, dizziness or headaches. Pt had no recent BP or HR readings he could give me but said he had it done earlier at he's dialysis appointment today. I advised pt to contact his orthopedic office however pt states they are not helping him.  Told pt I would forward our conversation over for review to see if there are any recommendations.

## 2023-06-12 NOTE — Telephone Encounter (Signed)
Pt c/o swelling/edema: STAT if pt has developed SOB within 24 hours  If swelling, where is the swelling located?   Knees and calves, hands feel tight  How much weight have you gained and in what time span?   No  Have you gained 2 pounds in a day or 5 pounds in a week?   No  Do you have a log of your daily weights (if so, list)?   Patient stated weight measured at dialysis three times a week  Are you currently taking a fluid pill?   No  Are you currently SOB?  Sometimes when he is lying down  Have you traveled recently in a car or plane for an extended period of time?   No  Patient stated he has been having swelling in his knees and calves and his hands has been feeling tight.  Patient wants advice on next steps.

## 2023-06-13 ENCOUNTER — Other Ambulatory Visit: Payer: Self-pay

## 2023-06-13 NOTE — Telephone Encounter (Signed)
Called patient back to let him know Dr. Fabio Bering recommendations. " He is on dialysis and volume controlled by that does not need diuretic He had [place] LLE and no DVT would have him f/u with primary, othro and renal does not sound like primary cardiac issue." Patient verbalized understanding.

## 2023-06-13 NOTE — Telephone Encounter (Signed)
Pt called in asking to speak with you again about a medication.

## 2023-06-13 NOTE — Telephone Encounter (Signed)
Called patient back about message. Patient wanted to have message repeated back to him. Patient verbalized understanding.

## 2023-06-14 ENCOUNTER — Other Ambulatory Visit: Payer: Self-pay

## 2023-06-14 ENCOUNTER — Emergency Department (HOSPITAL_COMMUNITY): Payer: 59

## 2023-06-14 ENCOUNTER — Encounter (HOSPITAL_COMMUNITY): Payer: Self-pay

## 2023-06-14 ENCOUNTER — Telehealth: Payer: Self-pay | Admitting: Cardiovascular Disease

## 2023-06-14 ENCOUNTER — Inpatient Hospital Stay (HOSPITAL_COMMUNITY)
Admission: EM | Admit: 2023-06-14 | Discharge: 2023-06-17 | DRG: 640 | Discharge status: Against Medical Advice | Payer: 59 | Attending: Internal Medicine | Admitting: Internal Medicine

## 2023-06-14 DIAGNOSIS — Z79899 Other long term (current) drug therapy: Secondary | ICD-10-CM

## 2023-06-14 DIAGNOSIS — E785 Hyperlipidemia, unspecified: Secondary | ICD-10-CM | POA: Diagnosis present

## 2023-06-14 DIAGNOSIS — I2510 Atherosclerotic heart disease of native coronary artery without angina pectoris: Secondary | ICD-10-CM | POA: Diagnosis present

## 2023-06-14 DIAGNOSIS — I255 Ischemic cardiomyopathy: Secondary | ICD-10-CM | POA: Diagnosis present

## 2023-06-14 DIAGNOSIS — B20 Human immunodeficiency virus [HIV] disease: Secondary | ICD-10-CM | POA: Diagnosis not present

## 2023-06-14 DIAGNOSIS — J9601 Acute respiratory failure with hypoxia: Secondary | ICD-10-CM | POA: Diagnosis present

## 2023-06-14 DIAGNOSIS — J189 Pneumonia, unspecified organism: Secondary | ICD-10-CM | POA: Diagnosis present

## 2023-06-14 DIAGNOSIS — R918 Other nonspecific abnormal finding of lung field: Secondary | ICD-10-CM | POA: Diagnosis not present

## 2023-06-14 DIAGNOSIS — Z813 Family history of other psychoactive substance abuse and dependence: Secondary | ICD-10-CM

## 2023-06-14 DIAGNOSIS — I5042 Chronic combined systolic (congestive) and diastolic (congestive) heart failure: Secondary | ICD-10-CM | POA: Diagnosis present

## 2023-06-14 DIAGNOSIS — Z5329 Procedure and treatment not carried out because of patient's decision for other reasons: Secondary | ICD-10-CM | POA: Diagnosis not present

## 2023-06-14 DIAGNOSIS — E8770 Fluid overload, unspecified: Secondary | ICD-10-CM | POA: Diagnosis present

## 2023-06-14 DIAGNOSIS — Z91119 Patient's noncompliance with dietary regimen due to unspecified reason: Secondary | ICD-10-CM

## 2023-06-14 DIAGNOSIS — R0989 Other specified symptoms and signs involving the circulatory and respiratory systems: Secondary | ICD-10-CM | POA: Diagnosis not present

## 2023-06-14 DIAGNOSIS — I132 Hypertensive heart and chronic kidney disease with heart failure and with stage 5 chronic kidney disease, or end stage renal disease: Secondary | ICD-10-CM | POA: Diagnosis present

## 2023-06-14 DIAGNOSIS — N250 Renal osteodystrophy: Secondary | ICD-10-CM | POA: Diagnosis not present

## 2023-06-14 DIAGNOSIS — K5520 Angiodysplasia of colon without hemorrhage: Secondary | ICD-10-CM | POA: Diagnosis present

## 2023-06-14 DIAGNOSIS — Z8249 Family history of ischemic heart disease and other diseases of the circulatory system: Secondary | ICD-10-CM

## 2023-06-14 DIAGNOSIS — I120 Hypertensive chronic kidney disease with stage 5 chronic kidney disease or end stage renal disease: Secondary | ICD-10-CM | POA: Diagnosis not present

## 2023-06-14 DIAGNOSIS — Z951 Presence of aortocoronary bypass graft: Secondary | ICD-10-CM | POA: Diagnosis not present

## 2023-06-14 DIAGNOSIS — M7989 Other specified soft tissue disorders: Secondary | ICD-10-CM | POA: Diagnosis not present

## 2023-06-14 DIAGNOSIS — E8779 Other fluid overload: Principal | ICD-10-CM | POA: Diagnosis present

## 2023-06-14 DIAGNOSIS — M545 Low back pain, unspecified: Secondary | ICD-10-CM | POA: Diagnosis present

## 2023-06-14 DIAGNOSIS — J449 Chronic obstructive pulmonary disease, unspecified: Secondary | ICD-10-CM | POA: Diagnosis present

## 2023-06-14 DIAGNOSIS — N186 End stage renal disease: Secondary | ICD-10-CM | POA: Diagnosis present

## 2023-06-14 DIAGNOSIS — Z888 Allergy status to other drugs, medicaments and biological substances status: Secondary | ICD-10-CM

## 2023-06-14 DIAGNOSIS — I252 Old myocardial infarction: Secondary | ICD-10-CM

## 2023-06-14 DIAGNOSIS — R0902 Hypoxemia: Secondary | ICD-10-CM | POA: Diagnosis not present

## 2023-06-14 DIAGNOSIS — D631 Anemia in chronic kidney disease: Secondary | ICD-10-CM | POA: Diagnosis present

## 2023-06-14 DIAGNOSIS — E118 Type 2 diabetes mellitus with unspecified complications: Secondary | ICD-10-CM

## 2023-06-14 DIAGNOSIS — F17210 Nicotine dependence, cigarettes, uncomplicated: Secondary | ICD-10-CM | POA: Diagnosis present

## 2023-06-14 DIAGNOSIS — I5043 Acute on chronic combined systolic (congestive) and diastolic (congestive) heart failure: Secondary | ICD-10-CM | POA: Diagnosis present

## 2023-06-14 DIAGNOSIS — M109 Gout, unspecified: Secondary | ICD-10-CM | POA: Diagnosis present

## 2023-06-14 DIAGNOSIS — Z820 Family history of epilepsy and other diseases of the nervous system: Secondary | ICD-10-CM

## 2023-06-14 DIAGNOSIS — E875 Hyperkalemia: Secondary | ICD-10-CM | POA: Diagnosis present

## 2023-06-14 DIAGNOSIS — R6889 Other general symptoms and signs: Secondary | ICD-10-CM | POA: Diagnosis not present

## 2023-06-14 DIAGNOSIS — N2581 Secondary hyperparathyroidism of renal origin: Secondary | ICD-10-CM | POA: Diagnosis present

## 2023-06-14 DIAGNOSIS — M898X9 Other specified disorders of bone, unspecified site: Secondary | ICD-10-CM | POA: Diagnosis present

## 2023-06-14 DIAGNOSIS — E1122 Type 2 diabetes mellitus with diabetic chronic kidney disease: Secondary | ICD-10-CM | POA: Diagnosis present

## 2023-06-14 DIAGNOSIS — Z91158 Patient's noncompliance with renal dialysis for other reason: Secondary | ICD-10-CM

## 2023-06-14 DIAGNOSIS — Z823 Family history of stroke: Secondary | ICD-10-CM

## 2023-06-14 DIAGNOSIS — D638 Anemia in other chronic diseases classified elsewhere: Secondary | ICD-10-CM | POA: Diagnosis not present

## 2023-06-14 DIAGNOSIS — R609 Edema, unspecified: Principal | ICD-10-CM

## 2023-06-14 DIAGNOSIS — Z885 Allergy status to narcotic agent status: Secondary | ICD-10-CM

## 2023-06-14 DIAGNOSIS — Z87442 Personal history of urinary calculi: Secondary | ICD-10-CM

## 2023-06-14 DIAGNOSIS — G8929 Other chronic pain: Secondary | ICD-10-CM | POA: Diagnosis present

## 2023-06-14 DIAGNOSIS — R0602 Shortness of breath: Secondary | ICD-10-CM | POA: Diagnosis not present

## 2023-06-14 DIAGNOSIS — Z992 Dependence on renal dialysis: Secondary | ICD-10-CM

## 2023-06-14 DIAGNOSIS — R739 Hyperglycemia, unspecified: Secondary | ICD-10-CM | POA: Diagnosis not present

## 2023-06-14 DIAGNOSIS — J4489 Other specified chronic obstructive pulmonary disease: Secondary | ICD-10-CM | POA: Diagnosis present

## 2023-06-14 DIAGNOSIS — Z743 Need for continuous supervision: Secondary | ICD-10-CM | POA: Diagnosis not present

## 2023-06-14 DIAGNOSIS — M79605 Pain in left leg: Secondary | ICD-10-CM | POA: Diagnosis not present

## 2023-06-14 DIAGNOSIS — Z86010 Personal history of colonic polyps: Secondary | ICD-10-CM

## 2023-06-14 DIAGNOSIS — Z833 Family history of diabetes mellitus: Secondary | ICD-10-CM

## 2023-06-14 DIAGNOSIS — D539 Nutritional anemia, unspecified: Secondary | ICD-10-CM | POA: Diagnosis present

## 2023-06-14 DIAGNOSIS — M1990 Unspecified osteoarthritis, unspecified site: Secondary | ICD-10-CM | POA: Diagnosis present

## 2023-06-14 LAB — CBC WITH DIFFERENTIAL/PLATELET
Abs Immature Granulocytes: 0.01 10*3/uL (ref 0.00–0.07)
Basophils Absolute: 0 10*3/uL (ref 0.0–0.1)
Basophils Relative: 1 %
Eosinophils Absolute: 0.1 10*3/uL (ref 0.0–0.5)
Eosinophils Relative: 2 %
HCT: 32.1 % — ABNORMAL LOW (ref 39.0–52.0)
Hemoglobin: 10.3 g/dL — ABNORMAL LOW (ref 13.0–17.0)
Immature Granulocytes: 0 %
Lymphocytes Relative: 25 %
Lymphs Abs: 1.2 10*3/uL (ref 0.7–4.0)
MCH: 34.3 pg — ABNORMAL HIGH (ref 26.0–34.0)
MCHC: 32.1 g/dL (ref 30.0–36.0)
MCV: 107 fL — ABNORMAL HIGH (ref 80.0–100.0)
Monocytes Absolute: 0.5 10*3/uL (ref 0.1–1.0)
Monocytes Relative: 11 %
Neutro Abs: 3 10*3/uL (ref 1.7–7.7)
Neutrophils Relative %: 61 %
Platelets: 150 10*3/uL (ref 150–400)
RBC: 3 MIL/uL — ABNORMAL LOW (ref 4.22–5.81)
RDW: 15.7 % — ABNORMAL HIGH (ref 11.5–15.5)
WBC: 4.9 10*3/uL (ref 4.0–10.5)
nRBC: 0 % (ref 0.0–0.2)

## 2023-06-14 LAB — HEPATITIS B SURFACE ANTIGEN: Hepatitis B Surface Ag: NONREACTIVE

## 2023-06-14 LAB — BASIC METABOLIC PANEL
Anion gap: 15 (ref 5–15)
BUN: 44 mg/dL — ABNORMAL HIGH (ref 8–23)
CO2: 26 mmol/L (ref 22–32)
Calcium: 8.6 mg/dL — ABNORMAL LOW (ref 8.9–10.3)
Chloride: 90 mmol/L — ABNORMAL LOW (ref 98–111)
Creatinine, Ser: 8.91 mg/dL — ABNORMAL HIGH (ref 0.61–1.24)
GFR, Estimated: 6 mL/min — ABNORMAL LOW (ref >60)
Glucose, Bld: 191 mg/dL — ABNORMAL HIGH (ref 70–99)
Potassium: 6 mmol/L — ABNORMAL HIGH (ref 3.5–5.1)
Sodium: 131 mmol/L — ABNORMAL LOW (ref 135–145)

## 2023-06-14 LAB — GLUCOSE, CAPILLARY
Glucose-Capillary: 190 mg/dL — ABNORMAL HIGH (ref 70–99)
Glucose-Capillary: 206 mg/dL — ABNORMAL HIGH (ref 70–99)

## 2023-06-14 LAB — TROPONIN I (HIGH SENSITIVITY)
Troponin I (High Sensitivity): 31 ng/L — ABNORMAL HIGH (ref <18)
Troponin I (High Sensitivity): 32 ng/L — ABNORMAL HIGH (ref <18)

## 2023-06-14 LAB — BRAIN NATRIURETIC PEPTIDE: B Natriuretic Peptide: 2565.1 pg/mL — ABNORMAL HIGH (ref 0.0–100.0)

## 2023-06-14 MED ORDER — LIDOCAINE-PRILOCAINE 2.5-2.5 % EX CREA: 1.0000 | TOPICAL_CREAM | CUTANEOUS | Status: DC | PRN

## 2023-06-14 MED ORDER — OXYCODONE HCL 5 MG PO TABS: 5.0000 mg | ORAL_TABLET | Freq: Once | ORAL | Status: DC

## 2023-06-14 MED ORDER — CHLORHEXIDINE GLUCONATE CLOTH 2 % EX PADS: 6.0000 | MEDICATED_PAD | Freq: Every day | CUTANEOUS | Status: DC

## 2023-06-14 MED ORDER — ONDANSETRON HCL 4 MG PO TABS: 4.0000 mg | ORAL_TABLET | Freq: Four times a day (QID) | ORAL | Status: DC | PRN

## 2023-06-14 MED ORDER — OXYCODONE-ACETAMINOPHEN 5-325 MG PO TABS
1.0000 | ORAL_TABLET | Freq: Four times a day (QID) | ORAL | Status: DC | PRN
  Administered 2023-06-14 – 2023-06-16 (×5): 1 via ORAL
  Filled 2023-06-14 (×6): qty 1

## 2023-06-14 MED ORDER — LATANOPROST 0.005 % OP SOLN
1.0000 [drp] | Freq: Every day | OPHTHALMIC | Status: DC
  Administered 2023-06-14 – 2023-06-16 (×3): 1 [drp] via OPHTHALMIC
  Filled 2023-06-14: qty 2.5

## 2023-06-14 MED ORDER — CALCIUM ACETATE 667 MG PO TABS: 667.0000 mg | ORAL_TABLET | ORAL | Status: DC

## 2023-06-14 MED ORDER — HEPARIN SODIUM (PORCINE) 1000 UNIT/ML DIALYSIS: 1000.0000 [IU] | INTRAMUSCULAR | Status: DC | PRN

## 2023-06-14 MED ORDER — ONDANSETRON HCL 4 MG/2ML IJ SOLN: 4.0000 mg | Freq: Four times a day (QID) | INTRAMUSCULAR | Status: DC | PRN

## 2023-06-14 MED ORDER — FENTANYL CITRATE PF 50 MCG/ML IJ SOSY
50.0000 ug | PREFILLED_SYRINGE | Freq: Once | INTRAMUSCULAR | Status: AC
  Administered 2023-06-14: 50 ug via INTRAVENOUS
  Filled 2023-06-14: qty 1

## 2023-06-14 MED ORDER — DICLOFENAC SODIUM 1 % EX GEL
2.0000 g | Freq: Four times a day (QID) | CUTANEOUS | Status: DC
  Administered 2023-06-15 – 2023-06-16 (×3): 2 g via TOPICAL
  Filled 2023-06-14: qty 100

## 2023-06-14 MED ORDER — SODIUM CHLORIDE 0.9% FLUSH
3.0000 mL | Freq: Two times a day (BID) | INTRAVENOUS | Status: DC
  Administered 2023-06-14 – 2023-06-16 (×6): 3 mL via INTRAVENOUS

## 2023-06-14 MED ORDER — BICTEGRAVIR-EMTRICITAB-TENOFOV 50-200-25 MG PO TABS
1.0000 | ORAL_TABLET | Freq: Every day | ORAL | Status: DC
  Administered 2023-06-14 – 2023-06-16 (×3): 1 via ORAL
  Filled 2023-06-14 (×3): qty 1

## 2023-06-14 MED ORDER — HEPARIN SODIUM (PORCINE) 5000 UNIT/ML IJ SOLN
5000.0000 [IU] | Freq: Three times a day (TID) | INTRAMUSCULAR | Status: DC
  Administered 2023-06-14 – 2023-06-15 (×2): 5000 [IU] via SUBCUTANEOUS
  Filled 2023-06-14 (×5): qty 1

## 2023-06-14 MED ORDER — TIMOLOL MALEATE 0.5 % OP SOLN
1.0000 [drp] | Freq: Every morning | OPHTHALMIC | Status: DC
  Administered 2023-06-15 – 2023-06-16 (×2): 1 [drp] via OPHTHALMIC
  Filled 2023-06-14: qty 5

## 2023-06-14 MED ORDER — UMECLIDINIUM-VILANTEROL 62.5-25 MCG/ACT IN AEPB
1.0000 | INHALATION_SPRAY | Freq: Every day | RESPIRATORY_TRACT | Status: DC
  Administered 2023-06-16: 1 via RESPIRATORY_TRACT
  Filled 2023-06-14: qty 14

## 2023-06-14 MED ORDER — GABAPENTIN 300 MG PO CAPS
300.0000 mg | ORAL_CAPSULE | Freq: Every day | ORAL | Status: DC
  Administered 2023-06-14 – 2023-06-16 (×3): 300 mg via ORAL
  Filled 2023-06-14 (×3): qty 1

## 2023-06-14 MED ORDER — DOXYCYCLINE HYCLATE 100 MG PO TABS
100.0000 mg | ORAL_TABLET | Freq: Once | ORAL | Status: AC
  Administered 2023-06-14: 100 mg via ORAL
  Filled 2023-06-14: qty 1

## 2023-06-14 MED ORDER — CALCIUM ACETATE (PHOS BINDER) 667 MG PO CAPS
667.0000 mg | ORAL_CAPSULE | ORAL | Status: DC | PRN
  Administered 2023-06-16: 667 mg via ORAL
  Filled 2023-06-14 (×2): qty 1

## 2023-06-14 MED ORDER — ALBUTEROL SULFATE (2.5 MG/3ML) 0.083% IN NEBU: 2.5000 mg | INHALATION_SOLUTION | Freq: Four times a day (QID) | RESPIRATORY_TRACT | Status: DC | PRN

## 2023-06-14 MED ORDER — ORAL CARE MOUTH RINSE: 15.0000 mL | OROMUCOSAL | Status: DC | PRN

## 2023-06-14 MED ORDER — ANTICOAGULANT SODIUM CITRATE 4% (200MG/5ML) IV SOLN: 5.0000 mL | Status: DC | PRN

## 2023-06-14 MED ORDER — ACETAMINOPHEN 650 MG RE SUPP: 650.0000 mg | Freq: Four times a day (QID) | RECTAL | Status: DC | PRN

## 2023-06-14 MED ORDER — CALCIUM ACETATE (PHOS BINDER) 667 MG PO CAPS
2001.0000 mg | ORAL_CAPSULE | Freq: Three times a day (TID) | ORAL | Status: DC
  Administered 2023-06-15 – 2023-06-16 (×5): 2001 mg via ORAL
  Filled 2023-06-14 (×7): qty 3

## 2023-06-14 MED ORDER — ATORVASTATIN CALCIUM 10 MG PO TABS
10.0000 mg | ORAL_TABLET | Freq: Every day | ORAL | Status: DC
  Administered 2023-06-14 – 2023-06-16 (×3): 10 mg via ORAL
  Filled 2023-06-14 (×3): qty 1

## 2023-06-14 MED ORDER — SODIUM CHLORIDE 0.9 % IV SOLN
1.0000 g | Freq: Once | INTRAVENOUS | Status: AC
  Administered 2023-06-14: 1 g via INTRAVENOUS
  Filled 2023-06-14: qty 10

## 2023-06-14 MED ORDER — INSULIN ASPART 100 UNIT/ML IJ SOLN
0.0000 [IU] | Freq: Three times a day (TID) | INTRAMUSCULAR | Status: DC
  Administered 2023-06-15 (×2): 1 [IU] via SUBCUTANEOUS
  Administered 2023-06-16: 2 [IU] via SUBCUTANEOUS

## 2023-06-14 MED ORDER — ALTEPLASE 2 MG IJ SOLR: 2.0000 mg | Freq: Once | INTRAMUSCULAR | Status: DC | PRN

## 2023-06-14 MED ORDER — ACETAMINOPHEN 325 MG PO TABS: 650.0000 mg | ORAL_TABLET | Freq: Four times a day (QID) | ORAL | Status: DC | PRN

## 2023-06-14 MED ORDER — FOLIC ACID 1 MG PO TABS
1.0000 mg | ORAL_TABLET | Freq: Every day | ORAL | Status: DC
  Administered 2023-06-14 – 2023-06-16 (×3): 1 mg via ORAL
  Filled 2023-06-14 (×3): qty 1

## 2023-06-14 MED ORDER — RENA-VITE PO TABS
1.0000 | ORAL_TABLET | Freq: Every day | ORAL | Status: DC
  Administered 2023-06-14 – 2023-06-16 (×3): 1 via ORAL
  Filled 2023-06-14 (×3): qty 1

## 2023-06-14 MED ORDER — MIDODRINE HCL 5 MG PO TABS
10.0000 mg | ORAL_TABLET | ORAL | Status: DC
  Filled 2023-06-14 (×2): qty 2

## 2023-06-14 MED ORDER — ALLOPURINOL 100 MG PO TABS
100.0000 mg | ORAL_TABLET | Freq: Every day | ORAL | Status: DC
  Administered 2023-06-15 – 2023-06-16 (×2): 100 mg via ORAL
  Filled 2023-06-14 (×2): qty 1

## 2023-06-14 NOTE — Telephone Encounter (Signed)
Pt is requesting a callback at [phone] from [facility] regarding yesterdays conversation they had and him now being in the hospital with Pneumonia he stated. Please advise

## 2023-06-14 NOTE — Telephone Encounter (Signed)
Patient called back. Patient stated he is very upset at Dr. Eden Emms and wanted him to know he is in the hospital. Patient stated he does have heart issues going on and we should have seen him. Informed patient that Dr. Eden Emms does not have any available appointment in the near future, plus he is not in the office this week. Informed patient that it is good that he is at the hospital getting treated, and we will let Dr. Eden Emms know that he is in the hospital, so he can review his chart.

## 2023-06-14 NOTE — Progress Notes (Signed)
Pt receives out-pt HD at [facility] on MWF. Will assist as needed.   Tracy Mounce Renal Navigator [phone] 

## 2023-06-14 NOTE — Telephone Encounter (Signed)
Pt returning nurse call     Call transferred

## 2023-06-14 NOTE — Telephone Encounter (Signed)
Left message for patient to call back  

## 2023-06-14 NOTE — ED Provider Notes (Signed)
Care assumed from [facility], PA-C at shift change pending labs.  See her note for full HPI.    In short patient is a 72 year old male who presents to the ED due to bilateral lower extremity edema.  History of ESRD on hemodialysis Monday, Wednesday, and Friday.  No missed sessions.  Extremity edema ongoing for the past few weeks.  History of CHF.  Had a negative DVT study 1 week ago. Physical Exam  BP 125/75 (BP Location: Right Arm)   Pulse 76   Temp 97.6 F (36.4 C) (Oral)   Resp 20   Ht 6\' 2"  (1.88 m)   Wt 82 kg   SpO2 97%   BMI 23.21 kg/m   Physical Exam Vitals and nursing note reviewed.  Constitutional:      General: He is not in acute distress.    Appearance: He is not ill-appearing.  HENT:     Head: Normocephalic.  Eyes:     Pupils: Pupils are equal, round, and reactive to light.  Cardiovascular:     Rate and Rhythm: Normal rate and regular rhythm.     Pulses: Normal pulses.     Heart sounds: Normal heart sounds. No murmur heard.    No friction rub. No gallop.  Pulmonary:     Effort: Pulmonary effort is normal.     Comments: Diminished breath sounds Abdominal:     General: Abdomen is flat. There is no distension.     Palpations: Abdomen is soft.     Tenderness: There is no abdominal tenderness. There is no guarding or rebound.  Musculoskeletal:        General: Normal range of motion.     Cervical back: Neck supple.     Comments: 2+ pitting edema bilaterally (left>right)  Skin:    General: Skin is warm and dry.  Neurological:     General: No focal deficit present.     Mental Status: He is alert.  Psychiatric:        Mood and Affect: Mood normal.        Behavior: Behavior normal.     Procedures  .Critical Care  Performed by: Mannie Stabile, PA-C Authorized by: Mannie Stabile, PA-C   Critical care provider statement:    Critical care time (minutes):  33   Critical care was necessary to treat or prevent imminent or life-threatening  deterioration of the following conditions:  Renal failure and respiratory failure   Critical care was time spent personally by me on the following activities:  Development of treatment plan with patient or surrogate, discussions with consultants, evaluation of patient's response to treatment, examination of patient, ordering and review of laboratory studies, ordering and review of radiographic studies, ordering and performing treatments and interventions, pulse oximetry, re-evaluation of patient's condition and review of old charts   ED Course / MDM   Clinical Course as of 06/14/23 0950  Wed Jun 14, 2023  0627 Patient rechecked post chest x-ray. He does tell me he has attended all of his recent dialysis sessions.  He is speaking in full sentences without signs of respiratory distress. Maintaining oxygen saturation on room air.  Tells me that he had a DVT study of his left leg last week which was negative. [MG]  [id number] B Natriuretic Peptide(!): 2,565.1 [CA]  0742 Troponin I (High Sensitivity)(!): 31 [CA]  0903 Potassium(!): 6.0 [CA]  0904 Called lab to inquire about potassium. BMP did not hemolyze. [CA]    Clinical Course User Index [  CA] Mannie Stabile, PA-C [MG] Tonette Lederer, PA-C   Medical Decision Making Amount and/or Complexity of Data Reviewed External Data Reviewed: notes. Labs: ordered. Decision-making details documented in ED Course. Radiology: ordered and independent interpretation performed. Decision-making details documented in ED Course. ECG/medicine tests: ordered and independent interpretation performed. Decision-making details documented in ED Course.  Risk Prescription drug management. Decision regarding hospitalization.   Care assumed from [facility], PA-C at shift change pending labs.  See her note for full MDM.  72 year old patient presents to the ED due to lower extremity edema.   7:01 AM reassessed patient at bedside.  Patient resting comfortably in  bed.  Admits to lower extremity edema for the past few weeks.  No chest pain.  Admits to some shortness of breath.  Patient states he has had a few dialysis sessions while he has had this lower extremity edema with no improvement. Negative DVT study 1 week ago which was negative. Lower suspicion for PE/DVT.   CBC reassuring.  No leukocytosis.  Hemoglobin at 10.3.  BNP elevated at 2500.  Troponin elevated at 31 likely secondary to demand ischemia.  Low suspicion for ACS.  Personally reviewed and interpreted which demonstrates an opacity in left lung base concerning for developing pneumonia.  IV antibiotics started however, feel it could be likely related to fluid overloaded status.  BMP with hyperkalemia at 6.  Discussed with lab.  Lab did not hemolyzed.  Hyperglycemia 191.  Elevated creatinine BUN consistent with ESRD.  9:51 AM Discussed with Dr. Arlean Hopping with nephrology who will see patient and arrange for dialysis today given fluid overload status and hyperkalemia  Patient dropped to 88% while sleeping. Placed on 2L [place].   10:08 AM Discussed with Dr. Katrinka Blazing with [facility] who agrees to admit patient  Lives at home Has PCP Hx HIV, ESRD    Jesusita Oka 06/14/23 1014    Margarita Grizzle, MD 06/16/23 [phone]

## 2023-06-14 NOTE — Consult Note (Signed)
Renal Service Consult Note [facility]  Jacob [name] 06/14/2023 Jacob Krabbe, MD Requesting Physician: Dr. Arlyss Queen  Reason for Consult: ESRD pt w/  HPI: The patient is a 72 y.o. year-old w/ PMH as below who presented to ED c/o bilat leg swelling, hx of CHF and esrd. Last HD was Monday, gets it MWF. Endorsed +DOE. In ED BP 120/ 80, HR 66 RR 22, 2L [place] 97%. CXR showed poss PNA. Exam showed leg swelling bilateral. Is on [place] O2 now. Pt admitted. We are asked to see for dialysis.   Pt seen in ED.  Main c/o is "hard to get around" although he doesn't call it SOB. No CP, no fevers. Not coughing.  Last HD on Monday.    ROS - denies CP, no joint pain, no HA, no blurry vision, no rash, no diarrhea, no nausea/ vomiting, no dysuria, no difficulty voiding   Past Medical History  Past Medical History:  Diagnosis Date   Acute respiratory failure (HCC) 03/01/2018   Anemia    Arthritis    "all over; mostly knees and back" (02/28/2018)   Chronic combined systolic and diastolic CHF (congestive heart failure) (HCC)    Chronic lower back pain    stenosis   Community acquired pneumonia 09/06/2013   COPD (chronic obstructive pulmonary disease) (HCC)    Coronary atherosclerosis of native coronary artery    a. 02/2003 s/p CABG x 2 (VG->RI, VG->RPDA; b. 11/2019 PCI: LM nl, LAD 90d, D3 50, RI 100, LCX 100p, OM3 100 - fills via L->L collats from D2/dLAD, RCA 100p, VG->RPDA ok, VG->RI 95 (3.5x48 Synergy XD DES).   Drug abuse (HCC)    hx; tested for cocaine as recently as 2/08. says he is not using drugs now - avoided defib. for this reason    ESRD (end stage renal disease) (HCC)    Hemo M-W-F- [facility]   Fall at home 10/2020   GERD (gastroesophageal reflux disease)    takes OTC meds as needed   GI bleeding    a. 11/2019 EGD: angiodysplastic lesions w/ bleeding s/p argon plasma/clipping/epi inj. Multiple admissions for the same.   Glaucoma    uses eye drops daily   Hepatitis B 1968   "tx'd  w/isolation; caught it from toilet stools in gym"   History of blood transfusion 03/01/2019   History of colon polyps    benign   History of gout    takes Allopurinol daily as well as Colchicine-if needed (02/28/2018)   History of kidney stones    HTN (hypertension)    takes Coreg,Imdur.and Apresoline daily   Human immunodeficiency virus (HIV) disease (HCC) dx'd 1995   on Biktarvy as of 12/2020.     Hyperlipidemia    Ischemic cardiomyopathy    a. 01/2019 Echo: EF 40-45%, diffuse HK, mild basal septal hypertrophy. Diast dysfxn. Nl RV size/fxn. Sev dil LA. Triv MR/TR/PR.   Muscle spasm    takes Zanaflex as needed   Myocardial infarction (HCC) ~ 2004/2005   Nocturia    Peripheral neuropathy    takes gabapentin daily   Pneumonia    "at least twice" (02/28/2018)   SDH (subdural hematoma) (HCC)    Syphilis, unspecified    Type II diabetes mellitus (HCC) 2004   Lantus daily.Average fasting blood sugar 125-199   Wears glasses    Wears partial dentures    Past Surgical History  Past Surgical History:  Procedure Laterality Date   AV FISTULA PLACEMENT Left 08/02/2018  Procedure: ARTERIOVENOUS (AV) FISTULA CREATION  left arm radiocephlic;  Surgeon: Cephus Shelling, MD;  Location: [facility] OR;  Service: Vascular;  Laterality: Left;   AV FISTULA PLACEMENT Left 08/01/2019   Procedure: LEFT BRACHIOCEPHALIC ARTERIOVENOUS (AV) FISTULA CREATION;  Surgeon: Larina Earthly, MD;  Location: [facility] OR;  Service: Vascular;  Laterality: Left;   AV FISTULA PLACEMENT Right 04/12/2022   Procedure: RIGHT UPPER EXTREMITY ARTERIOVENOUS (AV)  GRAFT INSERTION USING GORE STRETCH 4-7 MM;  Surgeon: Chuck Hint, MD;  Location: [facility] OR;  Service: Vascular;  Laterality: Right;   BASCILIC VEIN TRANSPOSITION Left 10/03/2019   Procedure: BASILIC VEIN TRANSPOSITION LEFT SECOND STAGE;  Surgeon: Larina Earthly, MD;  Location: [facility] OR;  Service: Vascular;  Laterality: Left;   BIOPSY  01/25/2021   Procedure: BIOPSY;  Surgeon: Sherrilyn Rist, MD;  Location: [facility] ENDOSCOPY;  Service: Gastroenterology;;   CARDIAC CATHETERIZATION  10/2002; 12/19/2004   Hattie Perch 03/08/2011   COLONOSCOPY  2013   Dr.John Marina Goodell    CORONARY ARTERY BYPASS GRAFT  02/24/2003   CABG X2/notes 03/08/2011   CORONARY STENT INTERVENTION N/A 12/19/2019   Procedure: CORONARY STENT INTERVENTION;  Surgeon: Corky Crafts, MD;  Location: [facility] INVASIVE CV LAB;  Service: Cardiovascular;  Laterality: N/A;   CORONARY ULTRASOUND/IVUS N/A 12/19/2019   Procedure: Intravascular Ultrasound/IVUS;  Surgeon: Corky Crafts, MD;  Location: [facility] INVASIVE CV LAB;  Service: Cardiovascular;  Laterality: N/A;   ENTEROSCOPY N/A 01/25/2021   Procedure: ENTEROSCOPY;  Surgeon: Sherrilyn Rist, MD;  Location: [facility] ENDOSCOPY;  Service: Gastroenterology;  Laterality: N/A;   ENTEROSCOPY N/A 02/13/2021   Procedure: ENTEROSCOPY;  Surgeon: Lynann Bologna, MD;  Location: [facility] ENDOSCOPY;  Service: Endoscopy;  Laterality: N/A;   ENTEROSCOPY N/A 05/07/2021   Procedure: ENTEROSCOPY;  Surgeon: Benancio Deeds, MD;  Location: [facility] ENDOSCOPY;  Service: Gastroenterology;  Laterality: N/A;   ESOPHAGOGASTRODUODENOSCOPY (EGD) WITH PROPOFOL N/A 02/08/2019   Procedure: ESOPHAGOGASTRODUODENOSCOPY (EGD) WITH PROPOFOL;  Surgeon: Rachael Fee, MD;  Location: [facility] ENDOSCOPY;  Service: Gastroenterology;  Laterality: N/A;   ESOPHAGOGASTRODUODENOSCOPY (EGD) WITH PROPOFOL N/A 12/22/2019   Procedure: ESOPHAGOGASTRODUODENOSCOPY (EGD) WITH PROPOFOL;  Surgeon: Shellia Cleverly, DO;  Location: [facility] ENDOSCOPY;  Service: Gastroenterology;  Laterality: N/A;   ESOPHAGOGASTRODUODENOSCOPY (EGD) WITH PROPOFOL N/A 10/19/2020   Procedure: ESOPHAGOGASTRODUODENOSCOPY (EGD) WITH PROPOFOL;  Surgeon: Lynann Bologna, MD;  Location: [facility] ENDOSCOPY;  Service: Endoscopy;  Laterality: N/A;   ESOPHAGOGASTRODUODENOSCOPY (EGD) WITH PROPOFOL N/A 12/22/2020   Procedure: ESOPHAGOGASTRODUODENOSCOPY (EGD) WITH PROPOFOL;  Surgeon: Iva Boop,  MD;  Location: [facility] ENDOSCOPY;  Service: Endoscopy;  Laterality: N/A;   ESOPHAGOGASTRODUODENOSCOPY (EGD) WITH PROPOFOL N/A 01/09/2021   Procedure: ESOPHAGOGASTRODUODENOSCOPY (EGD) WITH PROPOFOL;  Surgeon: Hilarie Fredrickson, MD;  Location: [facility] ENDOSCOPY;  Service: Endoscopy;  Laterality: N/A;   HEMOSTASIS CLIP PLACEMENT  12/22/2019   Procedure: HEMOSTASIS CLIP PLACEMENT;  Surgeon: Shellia Cleverly, DO;  Location: [facility] ENDOSCOPY;  Service: Gastroenterology;;   HEMOSTASIS CLIP PLACEMENT  12/22/2020   Procedure: HEMOSTASIS CLIP PLACEMENT;  Surgeon: Iva Boop, MD;  Location: [facility] ENDOSCOPY;  Service: Endoscopy;;   HEMOSTASIS CONTROL  12/22/2020   Procedure: HEMOSTASIS CONTROL/hemospray;  Surgeon: Iva Boop, MD;  Location: [facility] ENDOSCOPY;  Service: Endoscopy;;   HOT HEMOSTASIS N/A 02/08/2019   Procedure: HOT HEMOSTASIS (ARGON PLASMA COAGULATION/BICAP);  Surgeon: Rachael Fee, MD;  Location: [facility] ENDOSCOPY;  Service: Gastroenterology;  Laterality: N/A;   HOT HEMOSTASIS N/A 12/22/2019   Procedure: HOT HEMOSTASIS (ARGON PLASMA COAGULATION/BICAP);  Surgeon: Doristine Locks  V, DO;  Location: [facility] ENDOSCOPY;  Service: Gastroenterology;  Laterality: N/A;   HOT HEMOSTASIS N/A 10/19/2020   Procedure: HOT HEMOSTASIS (ARGON PLASMA COAGULATION/BICAP);  Surgeon: Lynann Bologna, MD;  Location: [facility] ENDOSCOPY;  Service: Endoscopy;  Laterality: N/A;   HOT HEMOSTASIS N/A 12/22/2020   Procedure: HOT HEMOSTASIS (ARGON PLASMA COAGULATION/BICAP);  Surgeon: Iva Boop, MD;  Location: [facility] ENDOSCOPY;  Service: Endoscopy;  Laterality: N/A;   HOT HEMOSTASIS N/A 01/09/2021   Procedure: HOT HEMOSTASIS (ARGON PLASMA COAGULATION/BICAP);  Surgeon: Hilarie Fredrickson, MD;  Location: [facility] ENDOSCOPY;  Service: Endoscopy;  Laterality: N/A;   HOT HEMOSTASIS N/A 01/25/2021   Procedure: HOT HEMOSTASIS (ARGON PLASMA COAGULATION/BICAP);  Surgeon: Sherrilyn Rist, MD;  Location: [facility] ENDOSCOPY;  Service: Gastroenterology;  Laterality: N/A;   HOT HEMOSTASIS  N/A 02/13/2021   Procedure: HOT HEMOSTASIS (ARGON PLASMA COAGULATION/BICAP);  Surgeon: Lynann Bologna, MD;  Location: [facility] ENDOSCOPY;  Service: Endoscopy;  Laterality: N/A;   HOT HEMOSTASIS N/A 05/07/2021   Procedure: HOT HEMOSTASIS (ARGON PLASMA COAGULATION/BICAP);  Surgeon: Benancio Deeds, MD;  Location: [facility] ENDOSCOPY;  Service: Gastroenterology;  Laterality: N/A;   INSERTION OF DIALYSIS CATHETER N/A 02/08/2022   Procedure: INSERTION OF TUNNELED DIALYSIS CATHETER;  Surgeon: Chuck Hint, MD;  Location: [facility] OR;  Service: Vascular;  Laterality: N/A;   INTERTROCHANTERIC HIP FRACTURE SURGERY Left 11/2006   Hattie Perch 03/08/2011   IR FLUORO GUIDE CV LINE LEFT  03/14/2022   IR FLUORO GUIDE CV LINE RIGHT  07/24/2019   IR FLUORO GUIDE CV LINE RIGHT  07/30/2019   IR US GUIDE VASC ACCESS RIGHT  07/24/2019   IR US GUIDE VASC ACCESS RIGHT  07/30/2019   LAPAROSCOPIC CHOLECYSTECTOMY  05/2006   LIGATION OF ARTERIOVENOUS  FISTULA Left 02/08/2022   Procedure: LIGATION OF LEFT ARTERIOVENOUS  FISTULA;  Surgeon: Chuck Hint, MD;  Location: [facility] OR;  Service: Vascular;  Laterality: Left;   LIGATION OF COMPETING BRANCHES OF ARTERIOVENOUS FISTULA Left 11/05/2018   Procedure: LIGATION OF COMPETING BRANCHES OF ARTERIOVENOUS FISTULA  LEFT  ARM;  Surgeon: Cephus Shelling, MD;  Location: [facility] OR;  Service: Vascular;  Laterality: Left;   LUMBAR LAMINECTOMY/DECOMPRESSION MICRODISCECTOMY N/A 02/29/2016   Procedure: Left L4-5 Lateral Recess Decompression, Removal Extradural Intraspinal Facet Cyst;  Surgeon: Eldred Manges, MD;  Location: [facility] OR;  Service: Orthopedics;  Laterality: N/A;   METATARSAL HEAD EXCISION Right 07/20/2022   Procedure: Irragation and debridement of right foot wound with extraction of all necrotic soft tissue and bone;  Surgeon: Pilar Plate, DPM;  Location: [facility] OR;  Service: Podiatry;  Laterality: Right;  Right 4th Met head and base of proximal phalanx resection   MULTIPLE TOOTH  EXTRACTIONS     ORIF MANDIBULAR FRACTURE Left 08/13/2004   ORIF of left body fracture mandible with KLS Martin 2.3-mm six hole/notes 03/08/2011   RIGHT/LEFT HEART CATH AND CORONARY/GRAFT ANGIOGRAPHY N/A 12/19/2019   Procedure: RIGHT/LEFT HEART CATH AND CORONARY/GRAFT ANGIOGRAPHY;  Surgeon: Corky Crafts, MD;  Location: [facility] INVASIVE CV LAB;  Service: Cardiovascular;  Laterality: N/A;   SCLEROTHERAPY  12/22/2019   Procedure: SCLEROTHERAPY;  Surgeon: Shellia Cleverly, DO;  Location: [facility] ENDOSCOPY;  Service: Gastroenterology;;   Susa Day  02/13/2021   Procedure: Susa Day;  Surgeon: Lynann Bologna, MD;  Location: [facility] ENDOSCOPY;  Service: Endoscopy;;   Family History  Family History  Problem Relation Age of Onset   Heart failure Father    Hypertension Father    Diabetes Brother    Heart attack Brother  Alzheimer's disease Mother    Stroke Sister    Diabetes Sister    Alzheimer's disease Sister    Hypertension Brother    Diabetes Brother    Drug abuse Brother    Colon cancer Neg Hx    Social History  reports that he has been smoking cigarettes. He has a 21.5 pack-year smoking history. He has never been exposed to tobacco smoke. He has never used smokeless tobacco. He reports current alcohol use of about 12.0 standard drinks of alcohol per week. He reports that he does not currently use drugs after having used the following drugs: Cocaine. Allergies  Allergies  Allergen Reactions   Augmentin [Amoxicillin-Pot Clavulanate] Diarrhea and Other (See Comments)    Severe diarrhea   Morphine Sulfate Anaphylaxis    Can't mix with oxycodone    Mucinex Fast-Max Other (See Comments)    Intense sweating    Amphetamines Other (See Comments)    Unknown reaction   Home medications Prior to Admission medications   Medication Sig Start Date End Date Taking? Authorizing Provider  midodrine (PROAMATINE) 10 MG tablet Take 10 mg by mouth 3 (three) times a week. 03/21/23  Yes [provider]  PREVIDENT 5000 PLUS 1.1 % CREA dental cream SMARTSIG:Application By Mouth 05/25/23  Yes [provider]  acetaminophen (TYLENOL) 650 MG CR tablet Take 1,300 mg by mouth 3 (three) times daily.    [provider]  allopurinol (ZYLOPRIM) 100 MG tablet Take one tablet by mouth once daily. 03/02/23   [name], MD  atorvastatin (LIPITOR) 10 MG tablet TAKE 1 TABLET(10 MG) BY MOUTH DAILY 11/08/22   [name], MD  B-D UF III MINI PEN NEEDLES 31G X 5 MM MISC USE 4 TIMES DAILY 06/06/22   [name], MD  BIKTARVY 50-200-25 MG TABS tablet Take 1 tablet by mouth daily. 04/11/23   Gardiner Barefoot, MD  Calcium Acetate 667 MG TABS Take 667-2,001 mg by mouth See admin instructions. Take 2001 mg by mouth three times a day with meals and 667 mg with each snack 01/20/21   [provider]  carvedilol (COREG) 3.125 MG tablet Take 1 tablet (3.125 mg total) by mouth 2 (two) times daily with a meal. 08/10/22   [name], MD  diclofenac Sodium (VOLTAREN) 1 % GEL Apply 2 grams topically to the affected area three times daily as needed for pain. 12/19/22   [name], MD  folic acid (FOLVITE) 1 MG tablet TAKE 1 TABLET(1 MG) BY MOUTH DAILY 02/06/23   [name], MD  gabapentin (NEURONTIN) 300 MG capsule TAKE 1 CAPSULE BY MOUTH DAILY. 05/11/22   [name], MD  gabapentin (NEURONTIN) 300 MG capsule Take 1 capsule (300 mg total) by mouth 2 (two) times daily. 04/06/23 10/03/23  Standiford, Jenelle Mages, DPM  gentamicin ointment (GARAMYCIN) 0.1 % Apply 1 Application topically daily. 01/06/23   Standiford, Jenelle Mages, DPM  glucose blood (ONETOUCH ULTRA) test strip Check blood sugar three times daily E11.40 05/18/23   [name], MD  insulin lispro (HUMALOG) 100 UNIT/ML KwikPen INJECT 20 UNITS UNDER THE SKIN THREE TIMES DAILY AS NEEDED FOR HIGH BLOOD SUGAR(ABOVE 150) 02/13/23   [name], MD  isosorbide mononitrate (IMDUR) 30 MG 24 hr tablet  Take 1 tablet (30 mg total) by mouth daily. 06/05/23   [name], MD  ketoconazole (NIZORAL) 2 % cream Apply 1 Application topically daily. 12/01/22   Standiford, Jenelle Mages, DPM  Lancets (ONETOUCH DELICA PLUS LANCET33G) MISC USE 3 TIMES DAILY 08/15/22   [name], MD  latanoprost (XALATAN) 0.005 % ophthalmic solution Place 1 drop into both eyes at bedtime.    [provider]  lidocaine-prilocaine (EMLA) cream Apply 1 application topically every Monday, Wednesday, and Friday with hemodialysis.    [provider]  Menthol-Camphor (TIGER BALM ARTHRITIS RUB) 11-11 % CREA Apply 1 Application topically daily as needed (pain).    [provider]  multivitamin (RENA-VIT) TABS tablet Take 1 tablet by mouth daily.    [provider]  nitroGLYCERIN (NITROSTAT) 0.3 MG SL tablet PLACE 1 TABLET UNDER THE TONGUE AS NEEDED FOR CHEST PAIN 04/07/23   [name], MD  octreotide (SANDOSTATIN LAR DEPOT) 10 MG injection Inject 10 mg into the muscle every 28 days. 08/18/22   Hilarie Fredrickson, MD  ondansetron (ZOFRAN) 4 MG tablet TAKE 1 TABLET(4 MG) BY MOUTH EVERY 8 HOURS AS NEEDED FOR NAUSEA OR VOMITING 07/25/22   [name], MD  oxyCODONE-acetaminophen (PERCOCET) 5-325 MG tablet Take 1 tablet by mouth every 6 (six) hours as needed for severe pain. 06/05/23 06/04/24  [name], MD  timolol (TIMOPTIC) 0.5 % ophthalmic solution Place 1 drop into both eyes in the morning. 04/04/19   [provider]  umeclidinium-vilanterol (ANORO ELLIPTA) 62.5-25 MCG/ACT AEPB Inhale 1 puff into the lungs daily. 06/07/23   [name], MD     Vitals:   06/14/23 0630 06/14/23 0645 06/14/23 0730 06/14/23 0928  BP: 125/68 123/64 119/78 109/64  Pulse:    70  Resp: 18 (!) 26 (!) 21 (!) 22  Temp:    98 F (36.7 C)  TempSrc:    Oral  SpO2:    97%  Weight:      Height:       Exam Gen alert, no distress, nasal O2 No rash, cyanosis or gangrene Sclera anicteric,  throat clear  No jvd or bruits Chest bilat basilar rales 1/3 up RRR no RG Abd soft ntnd no mass or ascites +bs GU normal male MS no joint effusions or deformity Ext 2+ bilat pretib edema, no other edema Neuro is alert, Ox 3 , nf    RFA AVG+Bruit      Home meds include - allopuinrol, atorvastatin, biktarvy, phoslo 3 ac, coreg 3.125 bid, gabapentin 300 every day, imdur 30 every day, midodrine 10 tid, renavite, octreotide 10mg  IM q 28 days, percocet, anoro ellipta, prns     OP HD: MWF [facility] 3h   400/1.5    86.5kg   2/2 bath  AVG   Heparin none - last OP HD 8/19, post wt 89kg - has been coming off 2-4kg for last 3 wks despite raise in edw - rocaltrol 1 mcg po three times per week - cinacalcet 60mg  po three times per week - venofer 100mg  three times per week thru 9/11 - mircera 50 mcg q 2 wks, last 8/14, due 8/28      Assessment/ Plan: Acute hypoxic resp failure/ possible PNA - CXR borderline for vasc congestion, + for possible PNA. Started on IV abx. Plan HD today or tonight.  Volume - has been coming off 2-4kg over dry wt the last 2-3 wks (at minimum). Looks volume up on exam. Plan best UF we can get.  ESRD - on HD MWF. Has not missed HD. Plan is for HD today or tonight.  HTN - BP's stable to low 120/80  Anemia esrd -  Hb 10.3, next esa due 8/28. Follow.  MBD ckd - Ca in range, add on phos, cont sensipar and rocaltrol, and binder if taking one.  COPD - denies having home O2      Vinson Moselle  MD [facility] 06/14/2023, 10:54 AM  Recent Labs  Lab 06/14/23 0624 06/14/23 0809  HGB 10.3*  --   CALCIUM  --  8.6*  CREATININE  --  8.91*  K  --  6.0*   Inpatient medications:  heparin  5,000 Units Subcutaneous Q8H   sodium chloride flush  3 mL Intravenous Q12H    acetaminophen **OR** acetaminophen, albuterol, ondansetron **OR** ondansetron (ZOFRAN) IV

## 2023-06-14 NOTE — ED Notes (Signed)
Patient arrived via ems from home c/o left leg swelling and pain. Pt dialysis M/W/F fistula in right arm +thrill/bruite. Patient sob while at rest has 3 + bilateral pitting edema to lower extremities. Patient a/o x 4 lungs diminished in bases

## 2023-06-14 NOTE — ED Provider Notes (Signed)
[facility] AT [facility] Provider Note   CSN: [id number] Arrival date & time: 06/14/23  0865     History  Chief Complaint  Patient presents with   Leg Pain    Dialysis pt c/o left leg swelling    Jacob [name] is a 72 y.o. male with past medical history heart failure, ESRD on hemodialysis Monday, Wednesday, and Friday, hypertension, hyperlipidemia, HIV who presents to the ED complaining of bilateral lower extremity swelling.  Unfortunately, patient endorses frustration with being unable to use his phone in the ED to call his dialysis center due to lack of cell service and will not provide me with much history so this history at this time is obtained from nursing staff who performed patient's triage. Despite attempting to redirect patient, offer assistance with his phone, and offer assistance  utilizing phone available in room, pt will not cooperate with history and exam.  Per RN, pt is here for swelling to both of his legs.  This has been ongoing for weeks now.  Jacob [name] typically dialyzes Monday, Wednesday, and Friday.  Also does have a history of heart failure.  Last EF in October 2023 and was 40 to 45%.  Jacob [name] did endorse dyspnea on exertion.  Unable to obtain further history at this time due to lack of patient cooperation.      Home Medications Prior to Admission medications   Medication Sig Start Date End Date Taking? Authorizing Provider  acetaminophen (TYLENOL) 650 MG CR tablet Take 1,300 mg by mouth 3 (three) times daily.    [provider]  allopurinol (ZYLOPRIM) 100 MG tablet Take one tablet by mouth once daily. 03/02/23   [name], MD  atorvastatin (LIPITOR) 10 MG tablet TAKE 1 TABLET(10 MG) BY MOUTH DAILY 11/08/22   [name], MD  B-D UF III MINI PEN NEEDLES 31G X 5 MM MISC USE 4 TIMES DAILY 06/06/22   [name], MD  BIKTARVY 50-200-25 MG TABS tablet Take 1 tablet by mouth daily. 04/11/23   Gardiner Barefoot, MD  Calcium  Acetate 667 MG TABS Take 667-2,001 mg by mouth See admin instructions. Take 2001 mg by mouth three times a day with meals and 667 mg with each snack 01/20/21   [provider]  carvedilol (COREG) 3.125 MG tablet Take 1 tablet (3.125 mg total) by mouth 2 (two) times daily with a meal. 08/10/22   [name], MD  diclofenac Sodium (VOLTAREN) 1 % GEL Apply 2 grams topically to the affected area three times daily as needed for pain. 12/19/22   [name], MD  folic acid (FOLVITE) 1 MG tablet TAKE 1 TABLET(1 MG) BY MOUTH DAILY 02/06/23   [name], MD  gabapentin (NEURONTIN) 300 MG capsule TAKE 1 CAPSULE BY MOUTH DAILY. 05/11/22   [name], MD  gabapentin (NEURONTIN) 300 MG capsule Take 1 capsule (300 mg total) by mouth 2 (two) times daily. 04/06/23 10/03/23  Standiford, Jenelle Mages, DPM  gentamicin ointment (GARAMYCIN) 0.1 % Apply 1 Application topically daily. 01/06/23   Standiford, Jenelle Mages, DPM  glucose blood (ONETOUCH ULTRA) test strip Check blood sugar three times daily E11.40 05/18/23   [name], MD  insulin lispro (HUMALOG) 100 UNIT/ML KwikPen INJECT 20 UNITS UNDER THE SKIN THREE TIMES DAILY AS NEEDED FOR HIGH BLOOD SUGAR(ABOVE 150) 02/13/23   [name], MD  isosorbide mononitrate (IMDUR) 30 MG 24 hr tablet Take 1 tablet (30 mg  total) by mouth daily. 06/05/23   [name], MD  ketoconazole (NIZORAL) 2 % cream Apply 1 Application topically daily. 12/01/22   Standiford, Jenelle Mages, DPM  Lancets (ONETOUCH DELICA PLUS [place]) MISC USE 3 TIMES DAILY 08/15/22   [name], MD  latanoprost (XALATAN) 0.005 % ophthalmic solution Place 1 drop into both eyes at bedtime.    [provider]  lidocaine-prilocaine (EMLA) cream Apply 1 application topically every Monday, Wednesday, and Friday with hemodialysis.    [provider]  Menthol-Camphor (TIGER BALM ARTHRITIS RUB) 11-11 % CREA Apply 1 Application topically daily as needed  (pain).    [provider]  multivitamin (RENA-VIT) TABS tablet Take 1 tablet by mouth daily.    [provider]  nitroGLYCERIN (NITROSTAT) 0.3 MG SL tablet PLACE 1 TABLET UNDER THE TONGUE AS NEEDED FOR CHEST PAIN 04/07/23   [name], MD  octreotide (SANDOSTATIN LAR DEPOT) 10 MG injection Inject 10 mg into the muscle every 28 days. 08/18/22   Hilarie Fredrickson, MD  ondansetron (ZOFRAN) 4 MG tablet TAKE 1 TABLET(4 MG) BY MOUTH EVERY 8 HOURS AS NEEDED FOR NAUSEA OR VOMITING 07/25/22   [name], MD  oxyCODONE-acetaminophen (PERCOCET) 5-325 MG tablet Take 1 tablet by mouth every 6 (six) hours as needed for severe pain. 06/05/23 06/04/24  [name], MD  timolol (TIMOPTIC) 0.5 % ophthalmic solution Place 1 drop into both eyes in the morning. 04/04/19   [provider]  umeclidinium-vilanterol (ANORO ELLIPTA) 62.5-25 MCG/ACT AEPB Inhale 1 puff into the lungs daily. 06/07/23   [name], MD      Allergies    Augmentin [amoxicillin-pot clavulanate], Morphine sulfate, Mucinex fast-max, and Amphetamines    Review of Systems   Review of Systems  Unable to perform ROS: Other    Physical Exam Updated Vital Signs BP 125/68   Pulse 76   Temp 97.6 F (36.4 C) (Oral)   Resp 18   Ht 6\' 2"  (1.88 m)   Wt 82 kg   SpO2 97%   BMI 23.21 kg/m  Physical Exam Vitals and nursing note reviewed.  Constitutional:      General: Jacob [name] is not in acute distress.    Appearance: Jacob [name] is not toxic-appearing.  HENT:     Head: Normocephalic and atraumatic.     Mouth/Throat:     Mouth: Mucous membranes are moist.  Eyes:     Extraocular Movements: Extraocular movements intact.  Cardiovascular:     Rate and Rhythm: Normal rate and regular rhythm.  Pulmonary:     Comments: Mild tachypnea, diminished breath sounds throughout, no wheezing, rales, or rhonchi Abdominal:     Palpations: Abdomen is soft.  Musculoskeletal:     Cervical back: Normal range of motion and neck  supple.     Right lower leg: Edema (2+ pitting) present.     Left lower leg: Edema (2+ pitting) present.  Skin:    General: Skin is warm and dry.  Neurological:     Mental Status: Jacob [name] is alert and oriented to person, place, and time.  Psychiatric:        Behavior: Behavior is uncooperative and agitated.     ED Results / Procedures / Treatments   Labs (all labs ordered are listed, but only abnormal results are displayed) Labs Reviewed  CBC WITH DIFFERENTIAL/PLATELET  BASIC METABOLIC PANEL  BRAIN NATRIURETIC PEPTIDE  TROPONIN I (HIGH SENSITIVITY)    EKG None  Radiology No results found.  Procedures Procedures    Medications Ordered in ED Medications - No data to display  ED Course/ Medical Decision Making/ A&P Clinical Course as of 06/14/23 0638  Wed Jun 14, 2023  6213 Patient rechecked post chest x-ray. Jacob [name] does tell me Jacob [name] has attended all of his recent dialysis sessions.  Jacob [name] is speaking in full sentences without signs of respiratory distress. Maintaining oxygen saturation on room air.  Tells me that Jacob [name] had a DVT study of his left leg last week which was negative. [MG]    Clinical Course User Index [MG] Tonette Lederer, PA-C                                 Medical Decision Making Amount and/or Complexity of Data Reviewed Labs: ordered. Decision-making details documented in ED Course. Radiology: ordered. Decision-making details documented in ED Course. ECG/medicine tests: ordered. Decision-making details documented in ED Course.   Medical Decision Making:   KENSLEY KAZMIERSKI is a 72 y.o. male who presented to the ED today with dyspnea detailed above.    Patient's presentation is complicated by their history of ESRD, HF, HTN, HLD.  Complete initial physical exam performed, notably the patient was mildly dyspneic with diminished BS. 3+ LE pitting edema. Neurologically intact. Uncooperative.    Reviewed and confirmed nursing documentation for past medical history,  family history, social history.    Initial Assessment:   With the patient's presentation, differential diagnosis includes but is not limited to  Cardiac (AHF, pericardial effusion and tamponade, arrhythmias, ischemia, etc) Respiratory (COPD, asthma, pneumonia, pneumothorax, primary pulmonary hypertension, PE/VQ mismatch) Hematological (anemia) Neuromuscular (ALS, Guillain-Barr, etc)  This is most consistent with an acute complicated illness  Initial Plan:  Screening labs including CBC and Metabolic panel to evaluate for infectious or metabolic etiology of disease.  CXR to evaluate for structural/infectious intrathoracic pathology.  EKG to evaluate for cardiac pathology Troponin to evaluate for cardiac etiology BNP to evaluate for fluid overload Objective evaluation as below reviewed   Initial Study Results:   Laboratory  Pending  EKG Pending  Radiology:  Pending   Final Assessment and Plan:   72 year old male presents to ED with LE edema, dyspnea. Diminished BS, maintaining O2 sat on room air, no respiratory distress. Does have 2+ pitting LE edema. Notes worse to LLE.  Had negative DVT study recently. Negative Wells criteria for DVT/PE. No missed dialysis sessions. Suspect acute volume overload in setting of pt's renal and heart failure. Workup pending at shift change and care transitioned to [facility] pending reassessment and disposition.     Clinical Impression:  1. Pitting edema   2. ESRD on hemodialysis [facility])      Data Unavailable            Final Clinical Impression(s) / ED Diagnoses Final diagnoses:  Pitting edema  ESRD on hemodialysis [facility])    Rx / DC Orders ED Discharge Orders     None         Tonette Lederer, PA-C 06/14/23 0865    Shon Baton, MD 06/16/23 [phone]

## 2023-06-14 NOTE — ED Triage Notes (Signed)
Patient arrived via ems from home c/o left leg swelling and pain. Dialysis M/W/F [facility] 272

## 2023-06-14 NOTE — H&P (Incomplete)
History and Physical    Patient: Jacob [name] YSA:[id number] DOB: 1951/05/06 DOA: 06/14/2023 DOS: the patient was seen and examined on 06/14/2023 PCP: [name], MD  Patient coming from: Home via EMS  Chief Complaint:  Chief Complaint  Patient presents with   Leg Pain    Dialysis pt c/o left leg swelling   HPI: Jacob [name] is a 72 y.o. male with medical history significant of hypertension, hyperlipidemia, combined systolic and diastolic CHF, CAD, ESRD on HD, DM type II, and COPD who presented with complaints of bilateral leg swelling.  He states that he has been asking Dr. Marisue Humble in regards to this over the last month,  but nothing had been done.  He has been going to hemodialysis as advised and not missed any sessions.  He reports that he has got a chronic nonproductive cough, but has been more short of breath with any kind of exertion.  Denies any recent fevers to his knowledge, but does report being cold records note he was evaluated and had a negative Doppler ultrasound of the lower extremities on 8/16.  He makes note that he is usually only on oxygen during hemodialysis.  In the emergency department patient was noted to be afebrile with tachypnea, blood pressures 112/50 - 137/80, and O2 saturations currently maintained on 2 L of nasal cannula oxygen.  Labs significant for BNP 2565.1, high-sensitivity troponin 31->32, hemoglobin 10.3, sodium 131, potassium 6, BUN 44, creatinine 8.91, glucose 191.  Chest x-ray noted cardiomegaly with pulmonary vascular congestion, and ill-defined opacity in the periphery of the left lung concerning for developing pneumonia with probable left pleural effusion.  Nephrology consulted for need of dialysis.  Patient had been given doxycycline 100 mg Rocephin 1 g IV, and fentanyl 50 mcg IV.  Review of Systems: As mentioned in the history of present illness. All other systems reviewed and are negative. Past Medical History:  Diagnosis Date   Acute  respiratory failure (HCC) 03/01/2018   Anemia    Arthritis    "all over; mostly knees and back" (02/28/2018)   Chronic combined systolic and diastolic CHF (congestive heart failure) (HCC)    Chronic lower back pain    stenosis   Community acquired pneumonia 09/06/2013   COPD (chronic obstructive pulmonary disease) (HCC)    Coronary atherosclerosis of native coronary artery    a. 02/2003 s/p CABG x 2 (VG->RI, VG->RPDA; b. 11/2019 PCI: LM nl, LAD 90d, D3 50, RI 100, LCX 100p, OM3 100 - fills via L->L collats from D2/dLAD, RCA 100p, VG->RPDA ok, VG->RI 95 (3.5x48 Synergy XD DES).   Drug abuse (HCC)    hx; tested for cocaine as recently as 2/08. says he is not using drugs now - avoided defib. for this reason    ESRD (end stage renal disease) (HCC)    Hemo M-W-F- [facility]   Fall at home 10/2020   GERD (gastroesophageal reflux disease)    takes OTC meds as needed   GI bleeding    a. 11/2019 EGD: angiodysplastic lesions w/ bleeding s/p argon plasma/clipping/epi inj. Multiple admissions for the same.   Glaucoma    uses eye drops daily   Hepatitis B 1968   "tx'd w/isolation; caught it from toilet stools in gym"   History of blood transfusion 03/01/2019   History of colon polyps    benign   History of gout    takes Allopurinol daily as well as Colchicine-if needed (02/28/2018)   History of kidney stones  HTN (hypertension)    takes Coreg,Imdur.and Apresoline daily   Human immunodeficiency virus (HIV) disease (HCC) dx'd 1995   on Biktarvy as of 12/2020.     Hyperlipidemia    Ischemic cardiomyopathy    a. 01/2019 Echo: EF 40-45%, diffuse HK, mild basal septal hypertrophy. Diast dysfxn. Nl RV size/fxn. Sev dil LA. Triv MR/TR/PR.   Muscle spasm    takes Zanaflex as needed   Myocardial infarction (HCC) ~ 2004/2005   Nocturia    Peripheral neuropathy    takes gabapentin daily   Pneumonia    "at least twice" (02/28/2018)   SDH (subdural hematoma) (HCC)    Syphilis, unspecified    Type II diabetes  mellitus (HCC) 2004   Lantus daily.Average fasting blood sugar 125-199   Wears glasses    Wears partial dentures    Past Surgical History:  Procedure Laterality Date   AV FISTULA PLACEMENT Left 08/02/2018   Procedure: ARTERIOVENOUS (AV) FISTULA CREATION  left arm radiocephlic;  Surgeon: Cephus Shelling, MD;  Location: [facility] OR;  Service: Vascular;  Laterality: Left;   AV FISTULA PLACEMENT Left 08/01/2019   Procedure: LEFT BRACHIOCEPHALIC ARTERIOVENOUS (AV) FISTULA CREATION;  Surgeon: Larina Earthly, MD;  Location: [facility] OR;  Service: Vascular;  Laterality: Left;   AV FISTULA PLACEMENT Right 04/12/2022   Procedure: RIGHT UPPER EXTREMITY ARTERIOVENOUS (AV)  GRAFT INSERTION USING GORE STRETCH 4-7 MM;  Surgeon: Chuck Hint, MD;  Location: [facility] OR;  Service: Vascular;  Laterality: Right;   BASCILIC VEIN TRANSPOSITION Left 10/03/2019   Procedure: BASILIC VEIN TRANSPOSITION LEFT SECOND STAGE;  Surgeon: Larina Earthly, MD;  Location: [facility] OR;  Service: Vascular;  Laterality: Left;   BIOPSY  01/25/2021   Procedure: BIOPSY;  Surgeon: Sherrilyn Rist, MD;  Location: [facility] ENDOSCOPY;  Service: Gastroenterology;;   CARDIAC CATHETERIZATION  10/2002; 12/19/2004   Hattie Perch 03/08/2011   COLONOSCOPY  2013   Dr.John Marina Goodell    CORONARY ARTERY BYPASS GRAFT  02/24/2003   CABG X2/notes 03/08/2011   CORONARY STENT INTERVENTION N/A 12/19/2019   Procedure: CORONARY STENT INTERVENTION;  Surgeon: Corky Crafts, MD;  Location: [facility] INVASIVE CV LAB;  Service: Cardiovascular;  Laterality: N/A;   CORONARY ULTRASOUND/IVUS N/A 12/19/2019   Procedure: Intravascular Ultrasound/IVUS;  Surgeon: Corky Crafts, MD;  Location: [facility] INVASIVE CV LAB;  Service: Cardiovascular;  Laterality: N/A;   ENTEROSCOPY N/A 01/25/2021   Procedure: ENTEROSCOPY;  Surgeon: Sherrilyn Rist, MD;  Location: [facility] ENDOSCOPY;  Service: Gastroenterology;  Laterality: N/A;   ENTEROSCOPY N/A 02/13/2021   Procedure: ENTEROSCOPY;  Surgeon: Lynann Bologna,  MD;  Location: [facility] ENDOSCOPY;  Service: Endoscopy;  Laterality: N/A;   ENTEROSCOPY N/A 05/07/2021   Procedure: ENTEROSCOPY;  Surgeon: Benancio Deeds, MD;  Location: [facility] ENDOSCOPY;  Service: Gastroenterology;  Laterality: N/A;   ESOPHAGOGASTRODUODENOSCOPY (EGD) WITH PROPOFOL N/A 02/08/2019   Procedure: ESOPHAGOGASTRODUODENOSCOPY (EGD) WITH PROPOFOL;  Surgeon: Rachael Fee, MD;  Location: [facility] ENDOSCOPY;  Service: Gastroenterology;  Laterality: N/A;   ESOPHAGOGASTRODUODENOSCOPY (EGD) WITH PROPOFOL N/A 12/22/2019   Procedure: ESOPHAGOGASTRODUODENOSCOPY (EGD) WITH PROPOFOL;  Surgeon: Shellia Cleverly, DO;  Location: [facility] ENDOSCOPY;  Service: Gastroenterology;  Laterality: N/A;   ESOPHAGOGASTRODUODENOSCOPY (EGD) WITH PROPOFOL N/A 10/19/2020   Procedure: ESOPHAGOGASTRODUODENOSCOPY (EGD) WITH PROPOFOL;  Surgeon: Lynann Bologna, MD;  Location: [facility] ENDOSCOPY;  Service: Endoscopy;  Laterality: N/A;   ESOPHAGOGASTRODUODENOSCOPY (EGD) WITH PROPOFOL N/A 12/22/2020   Procedure: ESOPHAGOGASTRODUODENOSCOPY (EGD) WITH PROPOFOL;  Surgeon: Iva Boop, MD;  Location: [facility] ENDOSCOPY;  Service:  Endoscopy;  Laterality: N/A;   ESOPHAGOGASTRODUODENOSCOPY (EGD) WITH PROPOFOL N/A 01/09/2021   Procedure: ESOPHAGOGASTRODUODENOSCOPY (EGD) WITH PROPOFOL;  Surgeon: Hilarie Fredrickson, MD;  Location: [facility] ENDOSCOPY;  Service: Endoscopy;  Laterality: N/A;   HEMOSTASIS CLIP PLACEMENT  12/22/2019   Procedure: HEMOSTASIS CLIP PLACEMENT;  Surgeon: Shellia Cleverly, DO;  Location: [facility] ENDOSCOPY;  Service: Gastroenterology;;   HEMOSTASIS CLIP PLACEMENT  12/22/2020   Procedure: HEMOSTASIS CLIP PLACEMENT;  Surgeon: Iva Boop, MD;  Location: [facility] ENDOSCOPY;  Service: Endoscopy;;   HEMOSTASIS CONTROL  12/22/2020   Procedure: HEMOSTASIS CONTROL/hemospray;  Surgeon: Iva Boop, MD;  Location: [facility] ENDOSCOPY;  Service: Endoscopy;;   HOT HEMOSTASIS N/A 02/08/2019   Procedure: HOT HEMOSTASIS (ARGON PLASMA COAGULATION/BICAP);  Surgeon: Rachael Fee, MD;  Location: [facility] ENDOSCOPY;  Service: Gastroenterology;  Laterality: N/A;   HOT HEMOSTASIS N/A 12/22/2019   Procedure: HOT HEMOSTASIS (ARGON PLASMA COAGULATION/BICAP);  Surgeon: Shellia Cleverly, DO;  Location: [facility] ENDOSCOPY;  Service: Gastroenterology;  Laterality: N/A;   HOT HEMOSTASIS N/A 10/19/2020   Procedure: HOT HEMOSTASIS (ARGON PLASMA COAGULATION/BICAP);  Surgeon: Lynann Bologna, MD;  Location: [name] ENDOSCOPY;  Service: Endoscopy;  Laterality: N/A;   HOT HEMOSTASIS N/A 12/22/2020   Procedure: HOT HEMOSTASIS (ARGON PLASMA COAGULATION/BICAP);  Surgeon: Iva Boop, MD;  Location: [facility] ENDOSCOPY;  Service: Endoscopy;  Laterality: N/A;   HOT HEMOSTASIS N/A 01/09/2021   Procedure: HOT HEMOSTASIS (ARGON PLASMA COAGULATION/BICAP);  Surgeon: Hilarie Fredrickson, MD;  Location: [facility] ENDOSCOPY;  Service: Endoscopy;  Laterality: N/A;   HOT HEMOSTASIS N/A 01/25/2021   Procedure: HOT HEMOSTASIS (ARGON PLASMA COAGULATION/BICAP);  Surgeon: Sherrilyn Rist, MD;  Location: [facility] ENDOSCOPY;  Service: Gastroenterology;  Laterality: N/A;   HOT HEMOSTASIS N/A 02/13/2021   Procedure: HOT HEMOSTASIS (ARGON PLASMA COAGULATION/BICAP);  Surgeon: Lynann Bologna, MD;  Location: [facility] ENDOSCOPY;  Service: Endoscopy;  Laterality: N/A;   HOT HEMOSTASIS N/A 05/07/2021   Procedure: HOT HEMOSTASIS (ARGON PLASMA COAGULATION/BICAP);  Surgeon: Benancio Deeds, MD;  Location: [facility] ENDOSCOPY;  Service: Gastroenterology;  Laterality: N/A;   INSERTION OF DIALYSIS CATHETER N/A 02/08/2022   Procedure: INSERTION OF TUNNELED DIALYSIS CATHETER;  Surgeon: Chuck Hint, MD;  Location: [facility] OR;  Service: Vascular;  Laterality: N/A;   INTERTROCHANTERIC HIP FRACTURE SURGERY Left 11/2006   Hattie Perch 03/08/2011   IR FLUORO GUIDE CV LINE LEFT  03/14/2022   IR FLUORO GUIDE CV LINE RIGHT  07/24/2019   IR FLUORO GUIDE CV LINE RIGHT  07/30/2019   IR US GUIDE VASC ACCESS RIGHT  07/24/2019   IR US GUIDE VASC ACCESS RIGHT  07/30/2019   LAPAROSCOPIC  CHOLECYSTECTOMY  05/2006   LIGATION OF ARTERIOVENOUS  FISTULA Left 02/08/2022   Procedure: LIGATION OF LEFT ARTERIOVENOUS  FISTULA;  Surgeon: Chuck Hint, MD;  Location: [facility] OR;  Service: Vascular;  Laterality: Left;   LIGATION OF COMPETING BRANCHES OF ARTERIOVENOUS FISTULA Left 11/05/2018   Procedure: LIGATION OF COMPETING BRANCHES OF ARTERIOVENOUS FISTULA  LEFT  ARM;  Surgeon: Cephus Shelling, MD;  Location: [facility] OR;  Service: Vascular;  Laterality: Left;   LUMBAR LAMINECTOMY/DECOMPRESSION MICRODISCECTOMY N/A 02/29/2016   Procedure: Left L4-5 Lateral Recess Decompression, Removal Extradural Intraspinal Facet Cyst;  Surgeon: Eldred Manges, MD;  Location: [facility] OR;  Service: Orthopedics;  Laterality: N/A;   METATARSAL HEAD EXCISION Right 07/20/2022   Procedure: Irragation and debridement of right foot wound with extraction of all necrotic soft tissue and bone;  Surgeon: Pilar Plate, DPM;  Location: [facility] OR;  Service: Podiatry;  Laterality: Right;  Right 4th Met head and base of proximal phalanx resection   MULTIPLE TOOTH EXTRACTIONS     ORIF MANDIBULAR FRACTURE Left 08/13/2004   ORIF of left body fracture mandible with KLS Martin 2.3-mm six hole/notes 03/08/2011   RIGHT/LEFT HEART CATH AND CORONARY/GRAFT ANGIOGRAPHY N/A 12/19/2019   Procedure: RIGHT/LEFT HEART CATH AND CORONARY/GRAFT ANGIOGRAPHY;  Surgeon: Corky Crafts, MD;  Location: [facility] INVASIVE CV LAB;  Service: Cardiovascular;  Laterality: N/A;   SCLEROTHERAPY  12/22/2019   Procedure: SCLEROTHERAPY;  Surgeon: Shellia Cleverly, DO;  Location: [facility] ENDOSCOPY;  Service: Gastroenterology;;   Susa Day  02/13/2021   Procedure: Susa Day;  Surgeon: Lynann Bologna, MD;  Location: [facility] ENDOSCOPY;  Service: Endoscopy;;   Social History:  reports that he has been smoking cigarettes. He has a 21.5 pack-year smoking history. He has never been exposed to tobacco smoke. He has never used smokeless tobacco. He reports current alcohol  use of about 12.0 standard drinks of alcohol per week. He reports that he does not currently use drugs after having used the following drugs: Cocaine.  Allergies  Allergen Reactions   Augmentin [Amoxicillin-Pot Clavulanate] Diarrhea and Other (See Comments)    Severe diarrhea   Morphine Sulfate Anaphylaxis    Can't mix with oxycodone    Mucinex Fast-Max Other (See Comments)    Intense sweating    Amphetamines Other (See Comments)    Unknown reaction    Family History  Problem Relation Age of Onset   Heart failure Father    Hypertension Father    Diabetes Brother    Heart attack Brother    Alzheimer's disease Mother    Stroke Sister    Diabetes Sister    Alzheimer's disease Sister    Hypertension Brother    Diabetes Brother    Drug abuse Brother    Colon cancer Neg Hx     Prior to Admission medications   Medication Sig Start Date End Date Taking? Authorizing Provider  acetaminophen (TYLENOL) 650 MG CR tablet Take 1,300 mg by mouth 3 (three) times daily.    [provider]  allopurinol (ZYLOPRIM) 100 MG tablet Take one tablet by mouth once daily. 03/02/23   [name], MD  atorvastatin (LIPITOR) 10 MG tablet TAKE 1 TABLET(10 MG) BY MOUTH DAILY 11/08/22   [name], MD  B-D UF III MINI PEN NEEDLES 31G X 5 MM MISC USE 4 TIMES DAILY 06/06/22   [name], MD  BIKTARVY 50-200-25 MG TABS tablet Take 1 tablet by mouth daily. 04/11/23   Gardiner Barefoot, MD  Calcium Acetate 667 MG TABS Take 667-2,001 mg by mouth See admin instructions. Take 2001 mg by mouth three times a day with meals and 667 mg with each snack 01/20/21   [provider]  carvedilol (COREG) 3.125 MG tablet Take 1 tablet (3.125 mg total) by mouth 2 (two) times daily with a meal. 08/10/22   [name], MD  diclofenac Sodium (VOLTAREN) 1 % GEL Apply 2 grams topically to the affected area three times daily as needed for pain. 12/19/22   [name], MD  folic acid (FOLVITE) 1 MG  tablet TAKE 1 TABLET(1 MG) BY MOUTH DAILY 02/06/23   [name], MD  gabapentin (NEURONTIN) 300 MG capsule TAKE 1 CAPSULE BY MOUTH DAILY. 05/11/22   [name], MD  gabapentin (NEURONTIN) 300 MG capsule Take 1 capsule (300 mg total) by mouth 2 (two) times daily. 04/06/23  10/03/23  Standiford, Jenelle Mages, DPM  gentamicin ointment (GARAMYCIN) 0.1 % Apply 1 Application topically daily. 01/06/23   Standiford, Jenelle Mages, DPM  glucose blood (ONETOUCH ULTRA) test strip Check blood sugar three times daily E11.40 05/18/23   [name], MD  insulin lispro (HUMALOG) 100 UNIT/ML KwikPen INJECT 20 UNITS UNDER THE SKIN THREE TIMES DAILY AS NEEDED FOR HIGH BLOOD SUGAR(ABOVE 150) 02/13/23   [name], MD  isosorbide mononitrate (IMDUR) 30 MG 24 hr tablet Take 1 tablet (30 mg total) by mouth daily. 06/05/23   [name], MD  ketoconazole (NIZORAL) 2 % cream Apply 1 Application topically daily. 12/01/22   Standiford, Jenelle Mages, DPM  Lancets (ONETOUCH DELICA PLUS [place]) MISC USE 3 TIMES DAILY 08/15/22   [name], MD  latanoprost (XALATAN) 0.005 % ophthalmic solution Place 1 drop into both eyes at bedtime.    [provider]  lidocaine-prilocaine (EMLA) cream Apply 1 application topically every Monday, Wednesday, and Friday with hemodialysis.    [provider]  Menthol-Camphor (TIGER BALM ARTHRITIS RUB) 11-11 % CREA Apply 1 Application topically daily as needed (pain).    [provider]  multivitamin (RENA-VIT) TABS tablet Take 1 tablet by mouth daily.    [provider]  nitroGLYCERIN (NITROSTAT) 0.3 MG SL tablet PLACE 1 TABLET UNDER THE TONGUE AS NEEDED FOR CHEST PAIN 04/07/23   [name], MD  octreotide (SANDOSTATIN LAR DEPOT) 10 MG injection Inject 10 mg into the muscle every 28 days. 08/18/22   Hilarie Fredrickson, MD  ondansetron (ZOFRAN) 4 MG tablet TAKE 1 TABLET(4 MG) BY MOUTH EVERY 8 HOURS AS NEEDED FOR NAUSEA OR VOMITING 07/25/22    [name], MD  oxyCODONE-acetaminophen (PERCOCET) 5-325 MG tablet Take 1 tablet by mouth every 6 (six) hours as needed for severe pain. 06/05/23 06/04/24  [name], MD  timolol (TIMOPTIC) 0.5 % ophthalmic solution Place 1 drop into both eyes in the morning. 04/04/19   [provider]  umeclidinium-vilanterol (ANORO ELLIPTA) 62.5-25 MCG/ACT AEPB Inhale 1 puff into the lungs daily. 06/07/23   [name], MD    Physical Exam: Vitals:   06/14/23 0630 06/14/23 0645 06/14/23 0730 06/14/23 0928  BP: 125/68 123/64 119/78 109/64  Pulse:    70  Resp: 18 (!) 26 (!) 21 (!) 22  Temp:    98 F (36.7 C)  TempSrc:    Oral  SpO2:    97%  Weight:      Height:        Data Reviewed: {Tip this will not be part of the note when signed- Document your independent interpretation of telemetry tracing, EKG, lab, Radiology test or any other diagnostic tests. Add any new diagnostic test ordered today. (Optional):[id number]} Sinus rhythm at 77 bpm with first-degree heart block and RBBB.  Reviewed labs, imaging, and pertinent records as noted  Assessment and Plan: Fluid overload ESRD on HD Systolic and diastolic CHF Patient presents with complaints of swelling of his bilateral lower extremities.  He denied any recent missed hemodialysis sessions.  Patient with at least 2+ pitting bilateral lower extremity edema on physical exam.  BNP elevated at  2565.1.  Chest x-ray noted followed by opacity in the periphery of the left lung base concerning for pneumonia with a probable left pleural effusion also noted.  Last echocardiogram noted EF to be 40 to 45% with grade 2 diastolic dysfunction.  The patient had been started on empiric antibiotics of Rocephin  and doxycycline.   -Admit to medical telemetry bed -Strict I&Os and daily weights -Check procalcitonin -Fluid management with HD -Appreciate nephrology consultative services, we will follow-up for any further  recommendations  Hyperkalemia Acute.  Potassium elevated at 6.  No significant T wave abnormality appreciated. -managed with HD  COPD, without acute exacerbation -Continue Anora Ellipta -Albuterol nebs as needed for shortness of breath/wheezing  Diabetes mellitus type 2, without long-term use of insulin On admission glucose was 191.  Patient's last hemoglobin A1c is 6.9 on 4/11 -Hypoglycemic protocols -Check hemoglobin A1c in a.m. -CBGs before every meal with very sensitive  Anemia of chronic disease 10.3 was below patient's baseline  HIV -Continue Biktarvy  Gout -Continue allopurinol   DVT prophylaxis: Heparin Advance Care Planning:   Code Status: Full Code   Consults: Nephro  Family Communication:   Severity of Illness: The appropriate patient status for this patient is INPATIENT. Inpatient status is judged to be reasonable and necessary in order to provide the required intensity of service to ensure the patient's safety. The patient's presenting symptoms, physical exam findings, and initial radiographic and laboratory data in the context of their chronic comorbidities is felt to place them at high risk for further clinical deterioration. Furthermore, it is not anticipated that the patient will be medically stable for discharge from the hospital within 2 midnights of admission.   * I certify that at the point of admission it is my clinical judgment that the patient will require inpatient hospital care spanning beyond 2 midnights from the point of admission due to high intensity of service, high risk for further deterioration and high frequency of surveillance required.*  Author: Clydie Braun, MD 06/14/2023 9:56 AM  For on call review [email or web address].

## 2023-06-14 NOTE — Progress Notes (Addendum)
Received patient in stretcher bed.Awake ,alert and oriented x 4.Consent verified.  Access used : Right AVF that worked well.  Duration of treatment 3.19 hours.  Fluid removed; 2.2 liters out of 4 liters prescribed.  Hemo comment: Not tolerating treatment on 4 liters fluid goal. Patient has had multiple severe legs cramping,turn down to 3 liters goal. Patient was moving every now and then that keeps HD machine beeping,last 11 minutes of his treatment ,circuit clogged.Total of 5.5 hours spend with the patient.  Hand off to the patient's nurse.

## 2023-06-14 NOTE — Telephone Encounter (Signed)
See previous note

## 2023-06-15 DIAGNOSIS — E8779 Other fluid overload: Secondary | ICD-10-CM | POA: Diagnosis not present

## 2023-06-15 DIAGNOSIS — N186 End stage renal disease: Secondary | ICD-10-CM | POA: Diagnosis not present

## 2023-06-15 DIAGNOSIS — I5042 Chronic combined systolic (congestive) and diastolic (congestive) heart failure: Secondary | ICD-10-CM | POA: Diagnosis not present

## 2023-06-15 DIAGNOSIS — E875 Hyperkalemia: Secondary | ICD-10-CM | POA: Diagnosis not present

## 2023-06-15 LAB — CBC
HCT: 31.5 % — ABNORMAL LOW (ref 39.0–52.0)
Hemoglobin: 10.1 g/dL — ABNORMAL LOW (ref 13.0–17.0)
MCH: 34 pg (ref 26.0–34.0)
MCHC: 32.1 g/dL (ref 30.0–36.0)
MCV: 106.1 fL — ABNORMAL HIGH (ref 80.0–100.0)
Platelets: 130 10*3/uL — ABNORMAL LOW (ref 150–400)
RBC: 2.97 MIL/uL — ABNORMAL LOW (ref 4.22–5.81)
RDW: 15.6 % — ABNORMAL HIGH (ref 11.5–15.5)
WBC: 4.7 10*3/uL (ref 4.0–10.5)
nRBC: 0 % (ref 0.0–0.2)

## 2023-06-15 LAB — RENAL FUNCTION PANEL
Albumin: 2.8 g/dL — ABNORMAL LOW (ref 3.5–5.0)
Anion gap: 16 — ABNORMAL HIGH (ref 5–15)
BUN: 33 mg/dL — ABNORMAL HIGH (ref 8–23)
CO2: 26 mmol/L (ref 22–32)
Calcium: 8.6 mg/dL — ABNORMAL LOW (ref 8.9–10.3)
Chloride: 90 mmol/L — ABNORMAL LOW (ref 98–111)
Creatinine, Ser: 6.71 mg/dL — ABNORMAL HIGH (ref 0.61–1.24)
GFR, Estimated: 8 mL/min — ABNORMAL LOW (ref >60)
Glucose, Bld: 137 mg/dL — ABNORMAL HIGH (ref 70–99)
Phosphorus: 4.2 mg/dL (ref 2.5–4.6)
Potassium: 5 mmol/L (ref 3.5–5.1)
Sodium: 132 mmol/L — ABNORMAL LOW (ref 135–145)

## 2023-06-15 LAB — GLUCOSE, CAPILLARY
Glucose-Capillary: 120 mg/dL — ABNORMAL HIGH (ref 70–99)
Glucose-Capillary: 154 mg/dL — ABNORMAL HIGH (ref 70–99)
Glucose-Capillary: 168 mg/dL — ABNORMAL HIGH (ref 70–99)
Glucose-Capillary: 177 mg/dL — ABNORMAL HIGH (ref 70–99)

## 2023-06-15 LAB — PROCALCITONIN: Procalcitonin: 0.3 ng/mL

## 2023-06-15 LAB — HEMOGLOBIN A1C
Hgb A1c MFr Bld: 6.8 % — ABNORMAL HIGH (ref 4.8–5.6)
Mean Plasma Glucose: 148.46 mg/dL

## 2023-06-15 LAB — HEPATITIS B SURFACE ANTIBODY, QUANTITATIVE: Hep B S AB Quant (Post): 165 m[IU]/mL

## 2023-06-15 NOTE — Telephone Encounter (Signed)
Jacob Stade, MD  You1 hour ago (10:50 AM)   He has pneumonia and needed more dialysis he has no acute heart problem When he has CHF the only Rx for it is more dialysis nothing for [place] to do differently    Left message for patient to call back.

## 2023-06-15 NOTE — TOC CM/SW Note (Signed)
Transition of Care [facility]) - Inpatient Brief Assessment   Patient Details  Name: Jacob [name] MRN: [id number] Date of Birth: Dec 24, 1950  Transition of Care [facility]) CM/SW Contact:    Tom-Johnson, Hershal Coria, RN Phone Number: 06/15/2023, 2:19 PM   Clinical Narrative:  Patient presented to the ED with SOB with Exertion and Bilateral LE Swelling. Admitted for Fluid Overload.  Patient has Hx of ESRD and goes to outpatient dialysis on MWF schedule. Uses [employer] transportation. States he is compliant with his Dialysis sessions and does not miss Dialysis. Nephrology following  From home alone, has a son who lives out of town. Has five supportive siblings. Does not drive, uses [employer] and [employer] transportation to and from appointments.  Has a cane and walker at home.  PCP is [name], MD and uses [employer] on [address].           Transition of Care Asessment: Insurance and Status: Insurance coverage has been reviewed Patient has primary care physician: Yes Home environment has been reviewed: Yes Prior level of function:: Independent Prior/Current Home Services: No current home services Social Determinants of Health Reivew: SDOH reviewed no interventions necessary Readmission risk has been reviewed: Yes Transition of care needs: no transition of care needs at this time

## 2023-06-15 NOTE — Progress Notes (Signed)
PROGRESS NOTE    KOLTYN WOHLFARTH  BJS:[id number] DOB: [birth year] DOA: 06/14/2023 PCP: [name], MD   Brief Narrative:  PRESIDENT SCURTI is a 72 y.o. male with medical history significant of hypertension, hyperlipidemia, combined systolic and diastolic CHF, CAD, ESRD on HD, DM type II, and COPD who presented with complaints of bilateral leg swelling.  He denies missing any recent dialysis but cannot otherwise specify what his diet or fluid intake should be.  Hospitalist called for admission, nephrology called in consult.  Assessment & Plan:   Active Problems:   Chronic combined systolic (congestive) and diastolic (congestive) heart failure (HCC)   Fluid overload   Hyperkalemia   Chronic obstructive pulmonary disease (HCC)   DM II, diet controlled   Anemia of chronic disease   Human immunodeficiency virus (HIV) disease (HCC)   Gout  Fluid overload ESRD on HD Systolic and diastolic CHF Concern for noncompliance -Nephrology following, dialysis ongoing -Lengthy discussion at bedside in regards to medication and dietary compliance.  Patient cannot accurately describe his current recommended p.o. intake or fluid restriction. -No fever, leukocytosis, cough or recent respiratory symptoms, procalcitonin negative, will hold further antibiotics and follow clinically -Strict I&Os and daily weights -Fluid management with HD via right upper extremity fistula   Hyperkalemia, improving -Initially elevated to 6, EKG without significant T wave abnormalities  -Downtrending appropriately with dialysis   COPD, without acute exacerbation -Continue Anora Ellipta -Albuterol nebs as needed for shortness of breath/wheezing   Diabetes mellitus type 2, without long-term use of insulin -A1c 6.8 -continue sliding scale insulin, hypoglycemic protocol   Anemia of chronic disease Concurrent macrocytic anemia -10.3 likely hemodilutional given volume overload as above -MCV chronically elevated  with normal B12/Folate in the past. May benefit from further outpatient workup with hematology if this continues to be a problem. -Complicated by AoCD given ESRD -Reticulocyte count not elevated despite anemia   HIV -Continue Biktarvy   Gout -Continue allopurinol  DVT prophylaxis: heparin injection 5,000 Units Start: 06/14/23 1400   Code Status:   Code Status: Full Code  Family Communication: None  Status is: Inpt  Dispo: The patient is from: Home              Anticipated d/c is to: Home              Anticipated d/c date is: 24-48h              Patient currently NOT medically stable for discharge  Consultants:  Nephro  Procedures:  None  Antimicrobials:  None   Subjective: No acute issues/events overnight  Objective: Vitals:   06/14/23 1830 06/14/23 1953 06/15/23 0016 06/15/23 0515  BP:  127/67 115/63 119/62  Pulse:  73 68 72  Resp:  16 16 16   Temp:  98.2 F (36.8 C) 98.2 F (36.8 C)   TempSrc:  Oral Oral   SpO2:  100% 100% (!) 84%  Weight: 79.9 kg     Height:        Intake/Output Summary (Last 24 hours) at 06/15/2023 0716 Last data filed at 06/15/2023 0615 Gross per 24 hour  Intake 340 ml  Output 2200 ml  Net -1860 ml   Filed Weights   06/14/23 0530 06/14/23 1830  Weight: 82 kg 79.9 kg    Examination:  General exam: Appears calm and comfortable  Respiratory system: Clear to auscultation. Respiratory effort normal. Cardiovascular system: S1 & S2 heard, RRR. No JVD, murmurs, rubs, gallops or clicks. No pedal edema.  Gastrointestinal system: Abdomen is nondistended, soft and nontender. No organomegaly or masses felt. Normal bowel sounds heard. Central nervous system: Alert and oriented. No focal neurological deficits. Extremities: Symmetric 5 x 5 power.  Data Reviewed: I have personally reviewed following labs and imaging studies  CBC: Recent Labs  Lab 06/14/23 0624 06/15/23 0401  WBC 4.9 4.7  NEUTROABS 3.0  --   HGB 10.3* 10.1*  HCT 32.1*  31.5*  MCV 107.0* 106.1*  PLT 150 130*   Basic Metabolic Panel: Recent Labs  Lab 06/14/23 0809 06/15/23 0401  NA 131* 132*  K 6.0* 5.0  CL 90* 90*  CO2 26 26  GLUCOSE 191* 137*  BUN 44* 33*  CREATININE 8.91* 6.71*  CALCIUM 8.6* 8.6*  PHOS  --  4.2   GFR: Estimated Creatinine Clearance: 10.9 mL/min (A) (by C-G formula based on SCr of 6.71 mg/dL (H)). Liver Function Tests: Recent Labs  Lab 06/15/23 0401  ALBUMIN 2.8*   No results for input(s): "LIPASE", "AMYLASE" in the last 168 hours. No results for input(s): "AMMONIA" in the last 168 hours. Coagulation Profile: No results for input(s): "INR", "PROTIME" in the last 168 hours. Cardiac Enzymes: No results for input(s): "CKTOTAL", "CKMB", "CKMBINDEX", "TROPONINI" in the last 168 hours. BNP (last 3 results) No results for input(s): "PROBNP" in the last 8760 hours. HbA1C: Recent Labs    06/15/23 0401  HGBA1C 6.8*   CBG: Recent Labs  Lab 06/14/23 2155 06/14/23 2240 06/15/23 0713  GLUCAP 190* 206* 120*   Lipid Profile: No results for input(s): "CHOL", "HDL", "LDLCALC", "TRIG", "CHOLHDL", "LDLDIRECT" in the last 72 hours. Thyroid Function Tests: No results for input(s): "TSH", "T4TOTAL", "FREET4", "T3FREE", "THYROIDAB" in the last 72 hours. Anemia Panel: No results for input(s): "VITAMINB12", "FOLATE", "FERRITIN", "TIBC", "IRON", "RETICCTPCT" in the last 72 hours. Sepsis Labs: Recent Labs  Lab 06/15/23 0401  PROCALCITON 0.30    No results found for this or any previous visit (from the past 240 hour(s)).       Radiology Studies: DG Chest 2 View  Result Date: 06/14/2023 CLINICAL DATA:  72 year old male with history of shortness of breath and leg swelling. EXAM: CHEST - 2 VIEW COMPARISON:  Chest x-ray 09/30/2022. FINDINGS: Ill-defined opacity in the periphery of the left lung base obscuring the left hemidiaphragm and portions of the left heart border. Right lung is clear. Probable small left pleural  effusion. No definite right pleural effusion. No pneumothorax. Cephalization of the pulmonary vasculature, without definite pulmonary edema. Heart size is mildly enlarged. Upper mediastinal contours are within normal limits. Atherosclerotic calcifications are noted in the thoracic aorta. Status post median sternotomy. Multiple old healed right-sided rib fractures are incidentally noted. IMPRESSION: 1. Poorly defined opacity in the periphery of the left lung base concerning for developing pneumonia. Probable small left pleural effusion also noted. Followup PA and lateral chest X-ray is recommended in 3-4 weeks following trial of antibiotic therapy to ensure resolution and exclude underlying malignancy. 2. Cardiomegaly with pulmonary venous congestion. 3. Aortic atherosclerosis. Electronically Signed   By: Trudie Reed M.D.   On: 06/14/2023 06:39        Scheduled Meds:  allopurinol  100 mg Oral Daily   atorvastatin  10 mg Oral Daily   bictegravir-emtricitabine-tenofovir AF  1 tablet Oral Daily   calcium acetate  2,001 mg Oral TID WC   Chlorhexidine Gluconate Cloth  6 each Topical Q0600   diclofenac Sodium  2 g Topical QID   folic acid  1 mg Oral  Daily   gabapentin  300 mg Oral QHS   heparin  5,000 Units Subcutaneous Q8H   insulin aspart  0-6 Units Subcutaneous TID WC   latanoprost  1 drop Both Eyes QHS   [START ON 06/16/2023] midodrine  10 mg Oral Q M,W,F-HD   multivitamin  1 tablet Oral Daily   sodium chloride flush  3 mL Intravenous Q12H   timolol  1 drop Both Eyes q AM   umeclidinium-vilanterol  1 puff Inhalation Daily   Continuous Infusions:   LOS: 1 day    Time spent:    Azucena Fallen, DO [facility]  If 7PM-7AM, please contact night-coverage [email or web address]  06/15/2023, 7:16 AM

## 2023-06-15 NOTE — Progress Notes (Signed)
[facility] Progress Note   Subjective:  Seen in room - sitting in edge of the bed. Says he feels better today, hoping to be discharged. Was dialyzed yesterday - plan was for 4L UFG but he started having severe cramping, so only got 2.2L off. He still does have LE edema - we discussed reducing intake and will likely need an extra HD to get back to his dry weight.   Objective Vitals:   06/14/23 1953 06/15/23 0016 06/15/23 0515 06/15/23 0857  BP: 127/67 115/63 119/62 122/62  Pulse: 73 68 72 75  Resp: 16 16 16 16   Temp: 98.2 F (36.8 C) 98.2 F (36.8 C)  98.1 F (36.7 C)  TempSrc: Oral Oral  Oral  SpO2: 100% 100% (!) 84% 100%  Weight:      Height:       Physical Exam General: Chronically ill appearing man, room air. NAD. Heart: RRR; no murmur Lungs: Bibasilar rales present Abdomen: soft, but distended Extremities: 2+ BLE pitting edema to knees Dialysis Access: R forearm AVG + bruit  Additional Objective Labs: Basic Metabolic Panel: Recent Labs  Lab 06/14/23 0809 06/15/23 0401  NA 131* 132*  K 6.0* 5.0  CL 90* 90*  CO2 26 26  GLUCOSE 191* 137*  BUN 44* 33*  CREATININE 8.91* 6.71*  CALCIUM 8.6* 8.6*  PHOS  --  4.2   Liver Function Tests: Recent Labs  Lab 06/15/23 0401  ALBUMIN 2.8*   CBC: Recent Labs  Lab 06/14/23 0624 06/15/23 0401  WBC 4.9 4.7  NEUTROABS 3.0  --   HGB 10.3* 10.1*  HCT 32.1* 31.5*  MCV 107.0* 106.1*  PLT 150 130*   Studies/Results: DG Chest 2 View  Result Date: 06/14/2023 CLINICAL DATA:  72 year old male with history of shortness of breath and leg swelling. EXAM: CHEST - 2 VIEW COMPARISON:  Chest x-ray 09/30/2022. FINDINGS: Ill-defined opacity in the periphery of the left lung base obscuring the left hemidiaphragm and portions of the left heart border. Right lung is clear. Probable small left pleural effusion. No definite right pleural effusion. No pneumothorax. Cephalization of the pulmonary vasculature, without definite  pulmonary edema. Heart size is mildly enlarged. Upper mediastinal contours are within normal limits. Atherosclerotic calcifications are noted in the thoracic aorta. Status post median sternotomy. Multiple old healed right-sided rib fractures are incidentally noted. IMPRESSION: 1. Poorly defined opacity in the periphery of the left lung base concerning for developing pneumonia. Probable small left pleural effusion also noted. Followup PA and lateral chest X-ray is recommended in 3-4 weeks following trial of antibiotic therapy to ensure resolution and exclude underlying malignancy. 2. Cardiomegaly with pulmonary venous congestion. 3. Aortic atherosclerosis. Electronically Signed   By: Trudie Reed M.D.   On: 06/14/2023 06:39    Medications:   allopurinol  100 mg Oral Daily   atorvastatin  10 mg Oral Daily   bictegravir-emtricitabine-tenofovir AF  1 tablet Oral Daily   calcium acetate  2,001 mg Oral TID WC   Chlorhexidine Gluconate Cloth  6 each Topical Q0600   diclofenac Sodium  2 g Topical QID   folic acid  1 mg Oral Daily   gabapentin  300 mg Oral QHS   heparin  5,000 Units Subcutaneous Q8H   insulin aspart  0-6 Units Subcutaneous TID WC   latanoprost  1 drop Both Eyes QHS   [START ON 06/16/2023] midodrine  10 mg Oral Q M,W,F-HD   multivitamin  1 tablet Oral Daily   sodium chloride flush  3 mL Intravenous Q12H   timolol  1 drop Both Eyes q AM   umeclidinium-vilanterol  1 puff Inhalation Daily    Dialysis Orders: MWF [facility] 3h   400/1.5    86.5kg   2/2 bath  AVG   Heparin none - last OP HD 8/19, post wt 89kg - has been coming off 2-4kg for last 3 wks despite raise in edw - rocaltrol 1 mcg po three times per week - cinacalcet 60mg  po three times per week - venofer 100mg  three times per week thru 9/11 - mircera 50 mcg q 2 wks, last 8/14, due 8/28  Assessment/Plan: Acute hypoxic resp failure/ possible PNA - CXR borderline for vasc congestion, + for possible PNA. Started on IV abx.  Dialyzed last night - some improvement, on room air. HTN/Volume: Has been volume up for some time - UF goal is limited by cramping. We discussed following low fluid intake and consider extra HD to help get back on track - he will think about it. ESRD: Continue HD per usual MWF - next HD tomorrow (8/23). Anemia of ESRD: Hgb stable, not due for ESA yet. Secondary HPTH: Ca/Phos good - continue home meds.  COPD - denies having home O2  Ozzie Hoyle, Cordelia Poche 06/15/2023, 10:22 AM  [employer]

## 2023-06-15 NOTE — Plan of Care (Signed)
  Problem: Education: Goal: Ability to describe self-care measures that may prevent or decrease complications (Diabetes Survival Skills Education) will improve Outcome: Progressing Goal: Individualized Educational Video(s) Outcome: Progressing   Problem: Coping: Goal: Ability to adjust to condition or change in health will improve Outcome: Progressing   Problem: Fluid Volume: Goal: Ability to maintain a balanced intake and output will improve Outcome: Progressing   Problem: Health Behavior/Discharge Planning: Goal: Ability to identify and utilize available resources and services will improve Outcome: Progressing Goal: Ability to manage health-related needs will improve Outcome: Progressing   Problem: Metabolic: Goal: Ability to maintain appropriate glucose levels will improve Outcome: Progressing   Problem: Nutritional: Goal: Maintenance of adequate nutrition will improve Outcome: Progressing Goal: Progress toward achieving an optimal weight will improve Outcome: Progressing   

## 2023-06-16 DIAGNOSIS — E8779 Other fluid overload: Secondary | ICD-10-CM | POA: Diagnosis not present

## 2023-06-16 DIAGNOSIS — I5042 Chronic combined systolic (congestive) and diastolic (congestive) heart failure: Secondary | ICD-10-CM | POA: Diagnosis not present

## 2023-06-16 DIAGNOSIS — D638 Anemia in other chronic diseases classified elsewhere: Secondary | ICD-10-CM | POA: Diagnosis not present

## 2023-06-16 DIAGNOSIS — E118 Type 2 diabetes mellitus with unspecified complications: Secondary | ICD-10-CM | POA: Diagnosis not present

## 2023-06-16 LAB — CBC
HCT: 30.6 % — ABNORMAL LOW (ref 39.0–52.0)
Hemoglobin: 9.9 g/dL — ABNORMAL LOW (ref 13.0–17.0)
MCH: 34.7 pg — ABNORMAL HIGH (ref 26.0–34.0)
MCHC: 32.4 g/dL (ref 30.0–36.0)
MCV: 107.4 fL — ABNORMAL HIGH (ref 80.0–100.0)
Platelets: 131 10*3/uL — ABNORMAL LOW (ref 150–400)
RBC: 2.85 MIL/uL — ABNORMAL LOW (ref 4.22–5.81)
RDW: 15.1 % (ref 11.5–15.5)
WBC: 4.5 10*3/uL (ref 4.0–10.5)
nRBC: 0 % (ref 0.0–0.2)

## 2023-06-16 LAB — GLUCOSE, CAPILLARY
Glucose-Capillary: 136 mg/dL — ABNORMAL HIGH (ref 70–99)
Glucose-Capillary: 201 mg/dL — ABNORMAL HIGH (ref 70–99)
Glucose-Capillary: 147 mg/dL — ABNORMAL HIGH (ref 70–99)

## 2023-06-16 LAB — RENAL FUNCTION PANEL
Albumin: 3.1 g/dL — ABNORMAL LOW (ref 3.5–5.0)
Anion gap: 16 — ABNORMAL HIGH (ref 5–15)
BUN: 60 mg/dL — ABNORMAL HIGH (ref 8–23)
CO2: 24 mmol/L (ref 22–32)
Calcium: 9.1 mg/dL (ref 8.9–10.3)
Chloride: 92 mmol/L — ABNORMAL LOW (ref 98–111)
Creatinine, Ser: 9.31 mg/dL — ABNORMAL HIGH (ref 0.61–1.24)
GFR, Estimated: 5 mL/min — ABNORMAL LOW (ref >60)
Glucose, Bld: 128 mg/dL — ABNORMAL HIGH (ref 70–99)
Phosphorus: 5.7 mg/dL — ABNORMAL HIGH (ref 2.5–4.6)
Potassium: 5.5 mmol/L — ABNORMAL HIGH (ref 3.5–5.1)
Sodium: 132 mmol/L — ABNORMAL LOW (ref 135–145)

## 2023-06-16 MED ORDER — LIDOCAINE HCL (PF) 1 % IJ SOLN: 5.0000 mL | INTRAMUSCULAR | Status: DC | PRN

## 2023-06-16 MED ORDER — LIDOCAINE-PRILOCAINE 2.5-2.5 % EX CREA: 1.0000 | TOPICAL_CREAM | CUTANEOUS | Status: DC | PRN

## 2023-06-16 MED ORDER — PENTAFLUOROPROP-TETRAFLUOROETH EX AERO: 1.0000 | INHALATION_SPRAY | CUTANEOUS | Status: DC | PRN

## 2023-06-16 MED ORDER — HEPARIN SODIUM (PORCINE) 1000 UNIT/ML DIALYSIS: 1000.0000 [IU] | INTRAMUSCULAR | Status: DC | PRN

## 2023-06-16 NOTE — Care Management Important Message (Signed)
Important Message  Patient Details  Name: Jacob [name] MRN: [id number] Date of Birth: July 16, 1951   Medicare Important Message Given:  Yes     Dorena Bodo 06/16/2023, 1:23 PM

## 2023-06-16 NOTE — Progress Notes (Signed)
PROGRESS NOTE    Jacob [name]  ZOX:[id number] DOB: [birth year] DOA: 06/14/2023 PCP: [name], MD   Brief Narrative:  Jacob [name] is a 72 y.o. male with medical history significant of hypertension, hyperlipidemia, combined systolic and diastolic CHF, CAD, ESRD on HD, DM type II, and COPD who presented with complaints of bilateral leg swelling.  He denies missing any recent dialysis but cannot otherwise specify what his diet or fluid intake should be.  Hospitalist called for admission, nephrology called in consult.  Assessment & Plan:   Active Problems:   Chronic combined systolic (congestive) and diastolic (congestive) heart failure (HCC)   Fluid overload   Hyperkalemia   Chronic obstructive pulmonary disease (HCC)   DM II, diet controlled   Anemia of chronic disease   Human immunodeficiency virus (HIV) disease (HCC)   Gout  Fluid overload ESRD on HD Systolic and diastolic CHF Concern for noncompliance -Nephrology following, dialysis ongoing -Lengthy discussion at bedside in regards to medication and dietary compliance.  Patient cannot accurately describe his current recommended p.o. intake or fluid restriction. -No fever, leukocytosis, cough or recent respiratory symptoms, procalcitonin negative, will hold further antibiotics and follow clinically -Strict I&Os and daily weights -Fluid management with HD via right upper extremity fistula   Questional encephalopathy, unspecified  -Patient continues to ask similar questions to myself and staff members repeatedly despite multiple answers. -Unclear if there is some underlying uremia symptomatic, other possible metabolic component or if patient's medical education/understanding is limited to a point where he does not understand what is being explained. -On 3 separate occasions over the past 24 hours I have explained the patient he has been admitted for volume overload and cannot leave until his volume/fluid levels are  down which requires dialysis. He continues to ask why he is in the hospital and what is the reason for his overload despite our discussion about his noncompliance with diet and free water intake.  Hyperkalemia, improving -Initially elevated to 6, EKG without significant T wave abnormalities  -Downtrending appropriately with dialysis   COPD, without acute exacerbation -Continue Anora Ellipta -Albuterol nebs as needed for shortness of breath/wheezing   Diabetes mellitus type 2, without long-term use of insulin -A1c 6.8 -continue sliding scale insulin, hypoglycemic protocol   Anemia of chronic disease Concurrent macrocytic anemia -10.3 likely hemodilutional given volume overload as above -MCV chronically elevated with normal B12/Folate in the past. May benefit from further outpatient workup with hematology if this continues to be a problem. -Complicated by AoCD given ESRD -Reticulocyte count not elevated despite anemia   HIV -Continue Biktarvy   Gout -Continue allopurinol  DVT prophylaxis: heparin injection 5,000 Units Start: 06/14/23 1400   Code Status:   Code Status: Full Code  Family Communication: None  Status is: Inpt  Dispo: The patient is from: Home              Anticipated d/c is to: Home              Anticipated d/c date is: 24-48h              Patient currently NOT medically stable for discharge  Consultants:  Nephro  Procedures:  None  Antimicrobials:  None   Subjective: No acute issues/events overnight  Objective: Vitals:   06/15/23 0857 06/15/23 1622 06/15/23 2135 06/16/23 0447  BP: 122/62 137/73 130/68 (!) 121/58  Pulse: 75 76 71 79  Resp: 16 19 16 16   Temp: 98.1 F (36.7 C) 98.2 F (36.8  C) 98.1 F (36.7 C) 98.3 F (36.8 C)  TempSrc: Oral Oral Oral Oral  SpO2: 100% 100% 100% 100%  Weight:      Height:        Intake/Output Summary (Last 24 hours) at 06/16/2023 0720 Last data filed at 06/16/2023 0505 Gross per 24 hour  Intake 880 ml   Output 2 ml  Net 878 ml   Filed Weights   06/14/23 0530 06/14/23 1830  Weight: 82 kg 79.9 kg    Examination:  General exam: Appears calm and comfortable  Respiratory system: Clear to auscultation. Respiratory effort normal. Cardiovascular system: S1 & S2 heard, RRR. No JVD, murmurs, rubs, gallops or clicks. No pedal edema. Gastrointestinal system: Abdomen is nondistended, soft and nontender. No organomegaly or masses felt. Normal bowel sounds heard. Central nervous system: Alert and oriented. No focal neurological deficits. Extremities: Symmetric 5 x 5 power.  Data Reviewed: I have personally reviewed following labs and imaging studies  CBC: Recent Labs  Lab 06/14/23 0624 06/15/23 0401  WBC 4.9 4.7  NEUTROABS 3.0  --   HGB 10.3* 10.1*  HCT 32.1* 31.5*  MCV 107.0* 106.1*  PLT 150 130*   Basic Metabolic Panel: Recent Labs  Lab 06/14/23 0809 06/15/23 0401  NA 131* 132*  K 6.0* 5.0  CL 90* 90*  CO2 26 26  GLUCOSE 191* 137*  BUN 44* 33*  CREATININE 8.91* 6.71*  CALCIUM 8.6* 8.6*  PHOS  --  4.2   GFR: Estimated Creatinine Clearance: 10.9 mL/min (A) (by C-G formula based on SCr of 6.71 mg/dL (H)). Liver Function Tests: Recent Labs  Lab 06/15/23 0401  ALBUMIN 2.8*   No results for input(s): "LIPASE", "AMYLASE" in the last 168 hours. No results for input(s): "AMMONIA" in the last 168 hours. Coagulation Profile: No results for input(s): "INR", "PROTIME" in the last 168 hours. Cardiac Enzymes: No results for input(s): "CKTOTAL", "CKMB", "CKMBINDEX", "TROPONINI" in the last 168 hours. BNP (last 3 results) No results for input(s): "PROBNP" in the last 8760 hours. HbA1C: Recent Labs    06/15/23 0401  HGBA1C 6.8*   CBG: Recent Labs  Lab 06/15/23 0713 06/15/23 1116 06/15/23 1628 06/15/23 2138 06/16/23 0718  GLUCAP 120* 154* 168* 177* 136*   Lipid Profile: No results for input(s): "CHOL", "HDL", "LDLCALC", "TRIG", "CHOLHDL", "LDLDIRECT" in the last 72  hours. Thyroid Function Tests: No results for input(s): "TSH", "T4TOTAL", "FREET4", "T3FREE", "THYROIDAB" in the last 72 hours. Anemia Panel: No results for input(s): "VITAMINB12", "FOLATE", "FERRITIN", "TIBC", "IRON", "RETICCTPCT" in the last 72 hours. Sepsis Labs: Recent Labs  Lab 06/15/23 0401  PROCALCITON 0.30    No results found for this or any previous visit (from the past 240 hour(s)).       Radiology Studies: No results found.      Scheduled Meds:  allopurinol  100 mg Oral Daily   atorvastatin  10 mg Oral Daily   bictegravir-emtricitabine-tenofovir AF  1 tablet Oral Daily   calcium acetate  2,001 mg Oral TID WC   Chlorhexidine Gluconate Cloth  6 each Topical Q0600   diclofenac Sodium  2 g Topical QID   folic acid  1 mg Oral Daily   gabapentin  300 mg Oral QHS   heparin  5,000 Units Subcutaneous Q8H   insulin aspart  0-6 Units Subcutaneous TID WC   latanoprost  1 drop Both Eyes QHS   midodrine  10 mg Oral Q M,W,F-HD   multivitamin  1 tablet Oral Daily   sodium  chloride flush  3 mL Intravenous Q12H   timolol  1 drop Both Eyes q AM   umeclidinium-vilanterol  1 puff Inhalation Daily   Continuous Infusions:   LOS: 2 days    Time spent:    Azucena Fallen, DO [facility]  If 7PM-7AM, please contact night-coverage [email or web address]  06/16/2023, 7:20 AM

## 2023-06-16 NOTE — Progress Notes (Signed)
Received patient in bed to unit.  Alert and oriented. --hx of dementa Informed consent signed and in chart.   TX duration:3.5  Patient tolerated well.  Transported back to the room  Alert, without acute distress.  Hand-off given to patient's nurse.   Access used: [facility] Access issues: no complications  Total UF removed: 3000 Medication(s) given: none   [facility]

## 2023-06-16 NOTE — Progress Notes (Signed)
Subjective:  seen In HD , sleeping  awoken , no sob tolerating uf   Objective Vital signs in last 24 hours: Vitals:   06/16/23 0830 06/16/23 0853 06/16/23 0930 06/16/23 1000  BP: 116/71 127/67 126/68 136/68  Pulse: 63 67 65 63  Resp: 19 14 15 12   Temp: 98.1 F (36.7 C) 97.8 F (36.6 C)    TempSrc: Oral Oral    SpO2: 92% 100% 100% 100%  Weight:  90.9 kg    Height:       Weight change:   Physical Exam: General: Chronically ill appearing man,  [place]  O2  NAD. Heart: RRR; no murmur Lungs:  cta  now labored  Abdomen: nabs soft,distended, nt   Extremities: 1+ BLE pitting edema to knees Dialysis Access: R forearm AVG  patent on hd   Dialysis Orders: MWF [facility] 3h   400/1.5    86.5kg   2/2 bath  AVG   Heparin none - last OP HD 8/19, post wt 89kg - has been coming off 2-4kg for last 3 wks despite raise in edw - rocaltrol 1 mcg po three times per week - cinacalcet 60mg  po three times per week - venofer 100mg  three times per week thru 9/11 - mircera 50 mcg q 2 wks, last 8/14, due 8/28   Assessment/Plan: Acute hypoxic resp failure/ possible PNA - CXR borderline for vasc congestion, + for possible PNA. Started on IV abx. Dialyzed pm 8/21  some improvement,  and today nl hd / need std wt    ( no home o2 needs to off o2  before dc )  HTN/Volume: Has been volume up for some time - UF goal is limited by cramping.  Had discussion =low fluid intake  ESRD: Continue HD per usual MWF - HD today  (8/23). Anemia of ESRD: Hgb 9.9  not due for ESA yet. Secondary HPTH: Ca/Phos good - continue home meds.  COPD - denies having home O2  Lenny Pastel, PA-C [facility] [phone] 06/16/2023,10:16 AM  LOS: 2 days   Labs: Basic Metabolic Panel: Recent Labs  Lab 06/14/23 0809 06/15/23 0401 06/16/23 0735  NA 131* 132* 132*  K 6.0* 5.0 5.5*  CL 90* 90* 92*  CO2 26 26 24   GLUCOSE 191* 137* 128*  BUN 44* 33* 60*  CREATININE 8.91* 6.71* 9.31*  CALCIUM 8.6* 8.6* 9.1  PHOS   --  4.2 5.7*   Liver Function Tests: Recent Labs  Lab 06/15/23 0401 06/16/23 0735  ALBUMIN 2.8* 3.1*   No results for input(s): "LIPASE", "AMYLASE" in the last 168 hours. No results for input(s): "AMMONIA" in the last 168 hours. CBC: Recent Labs  Lab 06/14/23 0624 06/15/23 0401 06/16/23 0933  WBC 4.9 4.7 4.5  NEUTROABS 3.0  --   --   HGB 10.3* 10.1* 9.9*  HCT 32.1* 31.5* 30.6*  MCV 107.0* 106.1* 107.4*  PLT 150 130* 131*   Cardiac Enzymes: No results for input(s): "CKTOTAL", "CKMB", "CKMBINDEX", "TROPONINI" in the last 168 hours. CBG: Recent Labs  Lab 06/15/23 0713 06/15/23 1116 06/15/23 1628 06/15/23 2138 06/16/23 0718  GLUCAP 120* 154* 168* 177* 136*    Studies/Results: No results found. Medications:   allopurinol  100 mg Oral Daily   atorvastatin  10 mg Oral Daily   bictegravir-emtricitabine-tenofovir AF  1 tablet Oral Daily   calcium acetate  2,001 mg Oral TID WC   Chlorhexidine Gluconate Cloth  6 each Topical Q0600   diclofenac Sodium  2  g Topical QID   folic acid  1 mg Oral Daily   gabapentin  300 mg Oral QHS   heparin  5,000 Units Subcutaneous Q8H   insulin aspart  0-6 Units Subcutaneous TID WC   latanoprost  1 drop Both Eyes QHS   midodrine  10 mg Oral Q M,W,F-HD   multivitamin  1 tablet Oral Daily   sodium chloride flush  3 mL Intravenous Q12H   timolol  1 drop Both Eyes q AM   umeclidinium-vilanterol  1 puff Inhalation Daily

## 2023-06-16 NOTE — Plan of Care (Signed)

## 2023-06-17 DIAGNOSIS — E8779 Other fluid overload: Secondary | ICD-10-CM | POA: Diagnosis not present

## 2023-06-17 NOTE — Progress Notes (Signed)
[facility] Patient Discharge Orders- [facility]: [facility] LEFT AMA , NO DC MEDS  Discharge Diagnoses: Chronic combined systolic (congestive) and diastolic (congestive) heart failure (HCC) / Fluid overload   Hyperkalemia   Aranesp: Given: no   Date and amount of last dose: 0  Last Hgb: 9.9 PRBC's Given: 0 Date/# of units: 0 ESA dose for discharge: mircera 50 mcg IV q 2 weeks 8/28 IV Iron dose at discharge: no  Heparin change: no  EDW Change: no New EDW:   [place]ath Change: no  Access intervention/Change: no Details:  Hectorol/Calcitriol change: no  Discharge Labs: Calcium9.1 Phosphorus 5.7 Albumin 3.1 K+ 5.5  IV Antibiotics: no Details:  On Coumadin?: no Last INR: Next INR: Managed By:   OTHER/APPTS/LAB ORDERS:    D/C Meds to be reconciled by nurse after every discharge.  Completed By:   Reviewed by: MD:______ RN_______

## 2023-06-17 NOTE — Progress Notes (Signed)
Patient insists on going home despite nurse explaining to him that the doctor has not discharged him. Asked patient to wait so that the doctor can come up to talk to him but patient would not listen to RN. Nurse explained to patient about AMA, patient states "this would not be the first time". He had already called a neighbor to come pick him up. Neighbor came and wheeled patient out.

## 2023-06-17 NOTE — Discharge Summary (Signed)
Physician Discharge Summary  Jacob [name] ZOX:[id number] DOB: 07/09/1951 DOA: 06/14/2023  PCP: [name], MD  Admit date: 06/14/2023 Discharge date: 06/17/2023  Admitted From: Home Disposition: Home  Discharge Condition: Unknown CODE STATUS: Full Diet recommendation: Renal diet, fluid restriction per nephrology  Brief/Interim Summary: Patient admitted due to volume overload in the setting of dietary noncompliance and inability to tolerate full regimen of dialysis due to varying degrees of complaints.  Patient admitted for close monitoring with nephrology for aggressive diuresis to attempt to get patient back to his dry weight.  Unfortunately during patient's hospitalization he decided that he had improved enough to leave the hospital overnight on the 23rd into the 24th despite multiple discussions with both myself as well as nephrology about the need for ongoing treatment in the hospital.  Nursing staff attempted to keep the patient for further acute treatment in the setting of volume overload but patient refused and ultimately left the hospital AGAINST MEDICAL ADVICE around 3 AM on the 24th.  Discharge Diagnoses:  Active Problems:   Chronic combined systolic (congestive) and diastolic (congestive) heart failure (HCC)   Fluid overload   Hyperkalemia   Chronic obstructive pulmonary disease (HCC)   DM II, diet controlled   Anemia of chronic disease   Human immunodeficiency virus (HIV) disease (HCC)   Gout   SIGNED:   Azucena Fallen, DO [facility] 06/17/2023, 11:27 AM Pager   If 7PM-7AM, please contact night-coverage [email or web address]

## 2023-06-19 ENCOUNTER — Telehealth: Payer: Self-pay

## 2023-06-19 DIAGNOSIS — N2581 Secondary hyperparathyroidism of renal origin: Secondary | ICD-10-CM | POA: Diagnosis not present

## 2023-06-19 DIAGNOSIS — D631 Anemia in chronic kidney disease: Secondary | ICD-10-CM | POA: Diagnosis not present

## 2023-06-19 DIAGNOSIS — N186 End stage renal disease: Secondary | ICD-10-CM | POA: Diagnosis not present

## 2023-06-19 DIAGNOSIS — Z992 Dependence on renal dialysis: Secondary | ICD-10-CM | POA: Diagnosis not present

## 2023-06-19 NOTE — Progress Notes (Signed)
Late Note Entry- June 19, 2023  Pt left hospital AMA early Saturday morning. Contacted [facility] this morning to provide an update on above information and to advise that pt will hopefully resume care this morning.   Olivia Canter Renal Navigator [phone]

## 2023-06-19 NOTE — Patient Outreach (Signed)
  Care Coordination   Follow Up Visit Note   06/19/2023 Name: [name] MRN: [id number] DOB: 07-20-1951  Care Coordination  Voicemail message received from Scott,EMT with [facility]. Return call to him. He voices that patient was referred to paramedic program by EMT due to multiple EMS calls/visits to home this year. However, pt does not qualify for program as primary diagnosis is not CHF but ESRD. Patient may be eligible for Adult Resource Team(ART) program if and when program expands to serve patient with other diagnosis.       Care Coordination Interventions:  Yes, provided    TOC Interventions Today    Flowsheet Row Most Recent Value  TOC Interventions   TOC Interventions Discussed/Reviewed TOC Interventions Discussed      Interventions Today    Flowsheet Row Most Recent Value  General Interventions   General Interventions Discussed/Reviewed General Interventions Discussed, Community Resources  [spoke with Lorin Picket, EMT-[facility]]  Doctor Visits Discussed/Reviewed Doctor Visits Discussed, PCP, Specialist  PCP/Specialist Visits Compliance with follow-up visit       Follow up plan: No further intervention required.   Encounter Outcome:  Pt. Visit Completed   Alessandra Grout [facility] Care Management Care Management Community Coordinator Direct Phone: [phone] Toll Free: [phone] Fax: [phone]

## 2023-06-19 NOTE — Transitions of Care (Post Inpatient/ED Visit) (Signed)
06/19/2023  Name: Jacob [name] MRN: [id number] DOB: 1951/02/14  Today's TOC FU Call Status: Today's TOC FU Call Status:: Successful TOC FU Call Completed TOC FU Call Complete Date: 06/19/23 Patient's Name and Date of Birth confirmed.  Transition Care Management Follow-up Telephone Call Date of Discharge: 06/17/23 (pt left AMA) Discharge Facility: [facility]) Type of Discharge: Inpatient Admission Primary Inpatient Discharge Diagnosis:: "pitting edema" How have you been since you were released from the hospital?: Better (pt states he is doing better-left hospital as he was not happy with some of the staff-voices he is elevating legs-still having some swelling-he went to HD tx this morning-went well.) Any questions or concerns?: No  Items Reviewed: Did you receive and understand the discharge instructions provided?: Yes Medications obtained,verified, and reconciled?: Yes (Medications Reviewed) Any new allergies since your discharge?: No Dietary orders reviewed?: Yes Type of Diet Ordered:: low salt/heart healthy/carb modified/renal Do you have support at home?: Yes People in Home: alone Name of Support/Comfort Primary Source: pt states he has sister and other family/friends to help him out as needed  Medications Reviewed Today: Medications Reviewed Today     Reviewed by Charlyn Minerva, RN (Registered Nurse) on 06/19/23 at 1151  Med List Status: <None>   Medication Order Taking? Sig Documenting Provider Last Dose Status Informant  acetaminophen (TYLENOL) 650 MG CR tablet [id number] Yes Take 1,300 mg by mouth 3 (three) times daily. [provider] Taking Active Self, Pharmacy Records           Med Note Juanita Craver Dec 14, 2020  1:02 PM)    allopurinol (ZYLOPRIM) 100 MG tablet [id number] Yes Take one tablet by mouth once daily. [name], MD Taking Active Self, Pharmacy Records  atorvastatin (LIPITOR) 10 MG tablet [id number] Yes TAKE 1  TABLET(10 MG) BY MOUTH DAILY [name], MD Taking Active Self, Pharmacy Records  B-D UF III MINI PEN NEEDLES 31G X 5 MM MISC [id number]  USE 4 TIMES DAILY [name], MD  Active Self, Pharmacy Records  BIKTARVY 50-200-25 MG TABS tablet [id number] Yes Take 1 tablet by mouth daily. Gardiner Barefoot, MD Taking Active Self, Pharmacy Records  Calcium Acetate 667 MG TABS [id number] Yes Take 667-2,001 mg by mouth See admin instructions. Take 2001 mg by mouth three times a day with meals and 667 mg with each snack [provider] Taking Active Self, Pharmacy Records  carvedilol (COREG) 3.125 MG tablet [id number] Yes Take 1 tablet (3.125 mg total) by mouth 2 (two) times daily with a meal. [name], MD Taking Active Self, Pharmacy Records  diclofenac Sodium (VOLTAREN) 1 % GEL [id number] Yes Apply 2 grams topically to the affected area three times daily as needed for pain. [name], MD Taking Active Self, Pharmacy Records  folic acid (FOLVITE) 1 MG tablet [id number] Yes TAKE 1 TABLET(1 MG) BY MOUTH DAILY [name], MD Taking Active Self, Pharmacy Records  gabapentin (NEURONTIN) 300 MG capsule [id number] Yes TAKE 1 CAPSULE BY MOUTH DAILY. [name], MD Taking Active Self, Pharmacy Records  gentamicin ointment (GARAMYCIN) 0.1 % [id number] No Apply 1 Application topically daily.  Patient not taking: Reported on 06/19/2023   Pilar Plate, DPM Not Taking Active Self, Pharmacy Records  glucose blood [facility] ULTRA) test strip [id number] Yes Check blood sugar three times daily E11.40 [name], MD Taking Active Self, Pharmacy Records  Insulin Lispro Yolande Jolly [facility]) [id number] Yes Inject 20 Units  into the skin 3 (three) times daily as needed (if blood sugar 150 or greater). [provider]  Active Self  isosorbide mononitrate (IMDUR) 30 MG 24 hr tablet [id number] Yes Take 1 tablet (30 mg total) by mouth daily. [name], MD Taking Active  Self, Pharmacy Records  Lancets [facility] [place] [facility] [place]) [place] [id number] Yes USE 3 TIMES DAILY [name], MD Taking Active Self, Pharmacy Records  latanoprost (XALATAN) 0.005 % ophthalmic solution [id number] Yes Place 1 drop into both eyes at bedtime. [provider] Taking Active Self, Pharmacy Records  Menthol-Camphor (TIGER BALM ARTHRITIS RUB) 11-11 % CREA [id number] Yes Apply 1 Application topically as needed (pain). [provider] Taking Active Self, Pharmacy Records  midodrine (PROAMATINE) 10 MG tablet [id number] Yes Take 10 mg by mouth 3 (three) times a week. [provider] Taking Active Self, Pharmacy Records  multivitamin (RENA-VIT) TABS tablet [id number] Yes Take 1 tablet by mouth daily. [provider] Taking Active Self, Pharmacy Records  octreotide (SANDOSTATIN LAR DEPOT) 10 MG injection [id number] Yes Inject 10 mg into the muscle every 28 days. Hilarie Fredrickson, MD Taking Active Self, Pharmacy Records  oxyCODONE-acetaminophen [facility]) 5-325 MG tablet [id number] Yes Take 1 tablet by mouth every 6 (six) hours as needed for severe pain. [name], MD Taking Active Self, Pharmacy Records  PREVIDENT 5000 PLUS 1.1 % CREA dental cream [id number] Yes Place 1 Application onto teeth. [provider] Taking Active Self, Pharmacy Records  timolol (TIMOPTIC) 0.5 % ophthalmic solution [id number] Yes Place 1 drop into both eyes in the morning. [provider] Taking Active Self, Pharmacy Records  umeclidinium-vilanterol [facility] ELLIPTA) 62.5-25 MCG/ACT AEPB [id number] Yes Inhale 1 puff into the lungs daily. [name], MD Taking Active Self, Pharmacy Records            Home Care and Equipment/Supplies: Were Home Health Services Ordered?: NA Any new equipment or medical supplies ordered?: NA  Functional Questionnaire: Do you need assistance with bathing/showering or dressing?: No Do you need assistance with meal  preparation?: No Do you need assistance with eating?: No Do you have difficulty maintaining continence: No Do you need assistance with getting out of bed/getting out of a chair/moving?: No Do you have difficulty managing or taking your medications?: No  Follow up appointments reviewed: PCP Follow-up appointment confirmed?: Yes Date of PCP follow-up appointment?: 06/20/23 Follow-up Provider: Dr. Jacquenette Shone Specialist [facility] Follow-up appointment confirmed?: NA Do you need transportation to your follow-up appointment?: No (pt confirms he uses [facility] or insurance transportaton to get to appts) Do you understand care options if your condition(s) worsen?: Yes-patient verbalized understanding   TOC Interventions Today    Flowsheet Row Most Recent Value  TOC Interventions   TOC Interventions Discussed/Reviewed TOC Interventions Discussed      Interventions Today    Flowsheet Row Most Recent Value  Chronic Disease   Chronic disease during today's visit Hypertension (HTN), Chronic Kidney Disease/End Stage Renal Disease (ESRD), Diabetes  General Interventions   General Interventions Discussed/Reviewed General Interventions Discussed, Doctor Visits, Durable Medical Equipment (DME), Referral to Nurse  [pt already followed by assigned RN care coordinator-reminded pt of upcoming appt date & time]  Doctor Visits Discussed/Reviewed Doctor Visits Discussed, PCP, Specialist  Durable Medical Equipment (DME) BP Cuff, Glucomoter  [[id number], BP monitor in the home-pt states he checks but doesn't record values-instructed on importance and he will start logging BP readings]  PCP/Specialist Visits Compliance with follow-up visit  Education Interventions   Education  Provided Provided Education  Provided Verbal Education On Nutrition, Blood Sugar Monitoring, When to see the doctor, Medication, Other  [sx mgmt]  Nutrition Interventions   Nutrition Discussed/Reviewed Nutrition Discussed, Fluid intake,  Decreasing sugar intake, Increasing proteins, Decreasing salt, Adding fruits and vegetables, Portion sizes  Pharmacy Interventions   Pharmacy Dicussed/Reviewed Pharmacy Topics Discussed, Medications and their functions  Safety Interventions   Safety Discussed/Reviewed Safety Discussed        Alessandra Grout [facility] Care Management Care Management Community Coordinator Direct Phone: [phone] Toll Free: [phone] Fax: [phone]

## 2023-06-20 ENCOUNTER — Telehealth: Payer: Self-pay | Admitting: Family

## 2023-06-20 ENCOUNTER — Other Ambulatory Visit: Payer: Self-pay | Admitting: Family

## 2023-06-20 ENCOUNTER — Ambulatory Visit (INDEPENDENT_AMBULATORY_CARE_PROVIDER_SITE_OTHER): Payer: 59 | Admitting: Sports Medicine

## 2023-06-20 ENCOUNTER — Encounter: Payer: Self-pay | Admitting: Sports Medicine

## 2023-06-20 VITALS — BP 124/76 | HR 80 | Temp 97.7°F | Resp 17 | Ht 74.0 in | Wt 201.6 lb

## 2023-06-20 DIAGNOSIS — Z794 Long term (current) use of insulin: Secondary | ICD-10-CM

## 2023-06-20 DIAGNOSIS — Z992 Dependence on renal dialysis: Secondary | ICD-10-CM

## 2023-06-20 DIAGNOSIS — R062 Wheezing: Secondary | ICD-10-CM

## 2023-06-20 DIAGNOSIS — N186 End stage renal disease: Secondary | ICD-10-CM

## 2023-06-20 DIAGNOSIS — Z79899 Other long term (current) drug therapy: Secondary | ICD-10-CM

## 2023-06-20 DIAGNOSIS — G894 Chronic pain syndrome: Secondary | ICD-10-CM

## 2023-06-20 DIAGNOSIS — I10 Essential (primary) hypertension: Secondary | ICD-10-CM | POA: Diagnosis not present

## 2023-06-20 DIAGNOSIS — E1142 Type 2 diabetes mellitus with diabetic polyneuropathy: Secondary | ICD-10-CM | POA: Diagnosis not present

## 2023-06-20 DIAGNOSIS — R0602 Shortness of breath: Secondary | ICD-10-CM

## 2023-06-20 MED ORDER — ALBUTEROL SULFATE (2.5 MG/3ML) 0.083% IN NEBU
2.5000 mg | INHALATION_SOLUTION | Freq: Four times a day (QID) | RESPIRATORY_TRACT | 2 refills | Status: DC | PRN
Start: 2023-06-20 — End: 2024-05-06

## 2023-06-20 NOTE — Progress Notes (Signed)
Nebulizer ordered per patient's request.Albuterol 2.5 mg /3 ml neb sol script send to [facility] as requested.

## 2023-06-20 NOTE — Telephone Encounter (Signed)
Patient called to notify provider that he had shortness of breath called EMS was told had wheezing and crackles in his lungs.Breathing treatment given via Nebulizer.states feeling much better after using nebulzer.He request orders for a nebulizer machine and solution. Nebulizer ordered per patient's request.Albuterol 2.5 mg /3 ml neb sol script send to [facility] as requested.

## 2023-06-20 NOTE — Progress Notes (Signed)
Careteam: Patient Care Team: [name], MD as PCP - General (Internal Medicine) [name], MD (Inactive) as PCP - Infectious Diseases (Infectious Diseases) [name], MD as PCP - Cardiology (Cardiology) [name], MD as Consulting Physician (Ophthalmology) [name], MD as Consulting Physician (Cardiology) [name], MD (Inactive) as Consulting Physician (Infectious Diseases) [name], DPM as Consulting Physician (Podiatry) [name], [name], NP as Nurse Practitioner (Family Medicine) [place], [facility], [facility]  PLACE OF SERVICE:  [facility]  Advanced Directive information Does Patient Have a Medical Advance Directive?: Yes, Type of Advance Directive: Living will, Does patient want to make changes to medical advance directive?: No - Patient declined  Allergies  Allergen Reactions   Augmentin [Amoxicillin-Pot Clavulanate] Diarrhea and Other (See Comments)    Severe diarrhea   Morphine Sulfate Anaphylaxis    Can't mix with oxycodone    Mucinex Fast-Max Other (See Comments)    Intense sweating    Amphetamines Other (See Comments)    Unknown reaction    Chief Complaint  Patient presents with   Medical Management of Chronic Issues    Patient is here for a follow up      HPI: Patient is a 72 y.o. male is here for hospital follow up  Pt was hospitalized for volume overload but left AMA   Pt says he is feeling better not weak like he used to be  Pt has  chronic SOB, but no recent change He is on dialysis  M/W/F  Had dialysis yesterday  Swelling in his legs improved   DM  Check BG 3 times a day  Lab Results  Component Value Date   HGBA1C 6.8 (H) 06/15/2023   HGBA1C 6.9 02/02/2023   HGBA1C 6.4 08/03/2022     Chronic pain  C/o chronic pain in his Rt foot  On oxycodone 3-4 tab/ day  Has pain contract 08/2022  CHF  Has exertional SOB  No recent change Lower extremity swelling  improved Able to speak in full sentences Does not appear to be in distress Denies feeling dizzy or lightheaded   Review of Systems:  Review of Systems  Constitutional:  Negative for chills and fever.  HENT:  Negative for congestion and sore throat.   Eyes:  Negative for double vision.  Respiratory:  Positive for shortness of breath (with exertion, no change from baseline). Negative for cough and sputum production.   Cardiovascular:  Positive for leg swelling. Negative for chest pain and palpitations.  Gastrointestinal:  Negative for abdominal pain, heartburn and nausea.  Genitourinary:  Negative for dysuria, frequency and hematuria.  Musculoskeletal:  Positive for falls and joint pain.  Neurological:  Negative for dizziness, sensory change and focal weakness.     Past Medical History:  Diagnosis Date   Acute respiratory failure (HCC) 03/01/2018   Anemia    Arthritis    "all over; mostly knees and back" (02/28/2018)   Chronic combined systolic and diastolic CHF (congestive heart failure) (HCC)    Chronic lower back pain    stenosis   Community acquired pneumonia 09/06/2013   COPD (chronic obstructive pulmonary disease) (HCC)    Coronary atherosclerosis of native coronary artery    a. 02/2003 s/p CABG x 2 (VG->RI, VG->RPDA; b. 11/2019 PCI: LM nl, LAD 90d, D3 50, RI 100, LCX 100p, OM3 100 - fills via L->L collats from D2/dLAD, RCA 100p, VG->RPDA ok, VG->RI 95 (3.5x48 Synergy XD DES).   Drug abuse (HCC)  hx; tested for cocaine as recently as 2/08. says he is not using drugs now - avoided defib. for this reason    ESRD (end stage renal disease) (HCC)    Hemo M-W-F- [facility]   Fall at home 10/2020   GERD (gastroesophageal reflux disease)    takes OTC meds as needed   GI bleeding    a. 11/2019 EGD: angiodysplastic lesions w/ bleeding s/p argon plasma/clipping/epi inj. Multiple admissions for the same.   Glaucoma    uses eye drops daily   Hepatitis B 1968   "tx'd w/isolation; caught it  from toilet stools in gym"   History of blood transfusion 03/01/2019   History of colon polyps    benign   History of gout    takes Allopurinol daily as well as Colchicine-if needed (02/28/2018)   History of kidney stones    HTN (hypertension)    takes Coreg,Imdur.and Apresoline daily   Human immunodeficiency virus (HIV) disease (HCC) dx'd 1995   on Biktarvy as of 12/2020.     Hyperlipidemia    Ischemic cardiomyopathy    a. 01/2019 Echo: EF 40-45%, diffuse HK, mild basal septal hypertrophy. Diast dysfxn. Nl RV size/fxn. Sev dil LA. Triv MR/TR/PR.   Muscle spasm    takes Zanaflex as needed   Myocardial infarction (HCC) ~ 2004/2005   Nocturia    Peripheral neuropathy    takes gabapentin daily   Pneumonia    "at least twice" (02/28/2018)   SDH (subdural hematoma) (HCC)    Syphilis, unspecified    Type II diabetes mellitus (HCC) 2004   Lantus daily.Average fasting blood sugar 125-199   Wears glasses    Wears partial dentures    Past Surgical History:  Procedure Laterality Date   AV FISTULA PLACEMENT Left 08/02/2018   Procedure: ARTERIOVENOUS (AV) FISTULA CREATION  left arm radiocephlic;  Surgeon: Cephus Shelling, MD;  Location: [facility] OR;  Service: Vascular;  Laterality: Left;   AV FISTULA PLACEMENT Left 08/01/2019   Procedure: LEFT BRACHIOCEPHALIC ARTERIOVENOUS (AV) FISTULA CREATION;  Surgeon: Larina Earthly, MD;  Location: [facility] OR;  Service: Vascular;  Laterality: Left;   AV FISTULA PLACEMENT Right 04/12/2022   Procedure: RIGHT UPPER EXTREMITY ARTERIOVENOUS (AV)  GRAFT INSERTION USING GORE STRETCH 4-7 MM;  Surgeon: Chuck Hint, MD;  [facility] OR;  Service: Vascular;  Laterality: Right;   BASCILIC VEIN TRANSPOSITION Left 10/03/2019   Procedure: BASILIC VEIN TRANSPOSITION LEFT SECOND STAGE;  Surgeon: Larina Earthly, MD;  Location: [facility] OR;  Service: Vascular;  Laterality: Left;   BIOPSY  01/25/2021   Procedure: BIOPSY;  Surgeon: Sherrilyn Rist, MD;  Location: [facility] ENDOSCOPY;   Service: Gastroenterology;;   CARDIAC CATHETERIZATION  10/2002; 12/19/2004   Hattie Perch 03/08/2011   COLONOSCOPY  2013   Dr.John Marina Goodell    CORONARY ARTERY BYPASS GRAFT  02/24/2003   CABG X2/notes 03/08/2011   CORONARY STENT INTERVENTION N/A 12/19/2019   Procedure: CORONARY STENT INTERVENTION;  Surgeon: Corky Crafts, MD;  Location: [facility] INVASIVE CV LAB;  Service: Cardiovascular;  Laterality: N/A;   CORONARY ULTRASOUND/IVUS N/A 12/19/2019   Procedure: Intravascular Ultrasound/IVUS;  Surgeon: Corky Crafts, MD;  Location: [facility] INVASIVE CV LAB;  Service: Cardiovascular;  Laterality: N/A;   ENTEROSCOPY N/A 01/25/2021   Procedure: ENTEROSCOPY;  Surgeon: Sherrilyn Rist, MD;  Location: [facility] ENDOSCOPY;  Service: Gastroenterology;  Laterality: N/A;   ENTEROSCOPY N/A 02/13/2021   Procedure: ENTEROSCOPY;  Surgeon: Lynann Bologna, MD;  Location: [facility] ENDOSCOPY;  Service: Endoscopy;  Laterality: N/A;   ENTEROSCOPY N/A 05/07/2021   Procedure: ENTEROSCOPY;  Surgeon: Benancio Deeds, MD;  Location: [facility] ENDOSCOPY;  Service: Gastroenterology;  Laterality: N/A;   ESOPHAGOGASTRODUODENOSCOPY (EGD) WITH PROPOFOL N/A 02/08/2019   Procedure: ESOPHAGOGASTRODUODENOSCOPY (EGD) WITH PROPOFOL;  Surgeon: Rachael Fee, MD;  Location: [facility] ENDOSCOPY;  Service: Gastroenterology;  Laterality: N/A;   ESOPHAGOGASTRODUODENOSCOPY (EGD) WITH PROPOFOL N/A 12/22/2019   Procedure: ESOPHAGOGASTRODUODENOSCOPY (EGD) WITH PROPOFOL;  Surgeon: Shellia Cleverly, DO;  Location: [facility] ENDOSCOPY;  Service: Gastroenterology;  Laterality: N/A;   ESOPHAGOGASTRODUODENOSCOPY (EGD) WITH PROPOFOL N/A 10/19/2020   Procedure: ESOPHAGOGASTRODUODENOSCOPY (EGD) WITH PROPOFOL;  Surgeon: Lynann Bologna, MD;  Location: [facility] ENDOSCOPY;  Service: Endoscopy;  Laterality: N/A;   ESOPHAGOGASTRODUODENOSCOPY (EGD) WITH PROPOFOL N/A 12/22/2020   Procedure: ESOPHAGOGASTRODUODENOSCOPY (EGD) WITH PROPOFOL;  Surgeon: Iva Boop, MD;  Location: [facility] ENDOSCOPY;  Service:  Endoscopy;  Laterality: N/A;   ESOPHAGOGASTRODUODENOSCOPY (EGD) WITH PROPOFOL N/A 01/09/2021   Procedure: ESOPHAGOGASTRODUODENOSCOPY (EGD) WITH PROPOFOL;  Surgeon: Hilarie Fredrickson, MD;  Location: [facility] ENDOSCOPY;  Service: Endoscopy;  Laterality: N/A;   HEMOSTASIS CLIP PLACEMENT  12/22/2019   Procedure: HEMOSTASIS CLIP PLACEMENT;  Surgeon: Shellia Cleverly, DO;  Location: [facility] ENDOSCOPY;  Service: Gastroenterology;;   HEMOSTASIS CLIP PLACEMENT  12/22/2020   Procedure: HEMOSTASIS CLIP PLACEMENT;  Surgeon: Iva Boop, MD;  Location: [facility] ENDOSCOPY;  Service: Endoscopy;;   HEMOSTASIS CONTROL  12/22/2020   Procedure: HEMOSTASIS CONTROL/hemospray;  Surgeon: Iva Boop, MD;  Location: [facility] ENDOSCOPY;  Service: Endoscopy;;   HOT HEMOSTASIS N/A 02/08/2019   Procedure: HOT HEMOSTASIS (ARGON PLASMA COAGULATION/BICAP);  Surgeon: Rachael Fee, MD;  Location: [facility] ENDOSCOPY;  Service: Gastroenterology;  Laterality: N/A;   HOT HEMOSTASIS N/A 12/22/2019   Procedure: HOT HEMOSTASIS (ARGON PLASMA COAGULATION/BICAP);  Surgeon: Shellia Cleverly, DO;  Location: [facility] ENDOSCOPY;  Service: Gastroenterology;  Laterality: N/A;   HOT HEMOSTASIS N/A 10/19/2020   Procedure: HOT HEMOSTASIS (ARGON PLASMA COAGULATION/BICAP);  Surgeon: Lynann Bologna, MD;  Location: [facility] ENDOSCOPY;  Service: Endoscopy;  Laterality: N/A;   HOT HEMOSTASIS N/A 12/22/2020   Procedure: HOT HEMOSTASIS (ARGON PLASMA COAGULATION/BICAP);  Surgeon: Iva Boop, MD;  Location: [facility] ENDOSCOPY;  Service: Endoscopy;  Laterality: N/A;   HOT HEMOSTASIS N/A 01/09/2021   Procedure: HOT HEMOSTASIS (ARGON PLASMA COAGULATION/BICAP);  Surgeon: Hilarie Fredrickson, MD;  Location: [facility] ENDOSCOPY;  Service: Endoscopy;  Laterality: N/A;   HOT HEMOSTASIS N/A 01/25/2021   Procedure: HOT HEMOSTASIS (ARGON PLASMA COAGULATION/BICAP);  Surgeon: Sherrilyn Rist, MD;  Location: [facility] ENDOSCOPY;  Service: Gastroenterology;  Laterality: N/A;   HOT HEMOSTASIS N/A 02/13/2021   Procedure: HOT  HEMOSTASIS (ARGON PLASMA COAGULATION/BICAP);  Surgeon: Lynann Bologna, MD;  Location: [facility] ENDOSCOPY;  Service: Endoscopy;  Laterality: N/A;   HOT HEMOSTASIS N/A 05/07/2021   Procedure: HOT HEMOSTASIS (ARGON PLASMA COAGULATION/BICAP);  Surgeon: Benancio Deeds, MD;  Location: [facility] ENDOSCOPY;  Service: Gastroenterology;  Laterality: N/A;   INSERTION OF DIALYSIS CATHETER N/A 02/08/2022   Procedure: INSERTION OF TUNNELED DIALYSIS CATHETER;  Surgeon: Chuck Hint, MD;  Location: [facility] OR;  Service: Vascular;  Laterality: N/A;   INTERTROCHANTERIC HIP FRACTURE SURGERY Left 11/2006   Hattie Perch 03/08/2011   IR FLUORO GUIDE CV LINE LEFT  03/14/2022   IR FLUORO GUIDE CV LINE RIGHT  07/24/2019   IR FLUORO GUIDE CV LINE RIGHT  07/30/2019   IR US GUIDE VASC ACCESS RIGHT  07/24/2019   IR US GUIDE VASC ACCESS RIGHT  07/30/2019   LAPAROSCOPIC CHOLECYSTECTOMY  05/2006   LIGATION OF ARTERIOVENOUS  FISTULA Left 02/08/2022   Procedure: LIGATION OF LEFT ARTERIOVENOUS  FISTULA;  Surgeon: Chuck Hint, MD;  Location: [facility] OR;  Service: Vascular;  Laterality: Left;   LIGATION OF COMPETING BRANCHES OF ARTERIOVENOUS FISTULA Left 11/05/2018   Procedure: LIGATION OF COMPETING BRANCHES OF ARTERIOVENOUS FISTULA  LEFT  ARM;  Surgeon: Cephus Shelling, MD;  Location: [facility] OR;  Service: Vascular;  Laterality: Left;   LUMBAR LAMINECTOMY/DECOMPRESSION MICRODISCECTOMY N/A 02/29/2016   Procedure: Left L4-5 Lateral Recess Decompression, Removal Extradural Intraspinal Facet Cyst;  Surgeon: Eldred Manges, MD;  Location: [facility] OR;  Service: Orthopedics;  Laterality: N/A;   METATARSAL HEAD EXCISION Right 07/20/2022   Procedure: Irragation and debridement of right foot wound with extraction of all necrotic soft tissue and bone;  Surgeon: Pilar Plate, DPM;  Location: [facility] OR;  Service: Podiatry;  Laterality: Right;  Right 4th Met head and base of proximal phalanx resection   MULTIPLE TOOTH EXTRACTIONS     ORIF MANDIBULAR FRACTURE  Left 08/13/2004   ORIF of left body fracture mandible with KLS Martin 2.3-mm six hole/notes 03/08/2011   RIGHT/LEFT HEART CATH AND CORONARY/GRAFT ANGIOGRAPHY N/A 12/19/2019   Procedure: RIGHT/LEFT HEART CATH AND CORONARY/GRAFT ANGIOGRAPHY;  Surgeon: Corky Crafts, MD;  Location: [facility] INVASIVE CV LAB;  Service: Cardiovascular;  Laterality: N/A;   SCLEROTHERAPY  12/22/2019   Procedure: SCLEROTHERAPY;  Surgeon: Shellia Cleverly, DO;  Location: [facility] ENDOSCOPY;  Service: Gastroenterology;;   Susa Day  02/13/2021   Procedure: Susa Day;  Surgeon: Lynann Bologna, MD;  Location: [facility] ENDOSCOPY;  Service: Endoscopy;;   Social History:   reports that he has been smoking cigarettes. He has a 21.5 pack-year smoking history. He has never been exposed to tobacco smoke. He has never used smokeless tobacco. He reports current alcohol use of about 12.0 standard drinks of alcohol per week. He reports that he does not currently use drugs after having used the following drugs: Cocaine.  Family History  Problem Relation Age of Onset   Heart failure Father    Hypertension Father    Diabetes Brother    Heart attack Brother    Alzheimer's disease Mother    Stroke Sister    Diabetes Sister    Alzheimer's disease Sister    Hypertension Brother    Diabetes Brother    Drug abuse Brother    Colon cancer Neg Hx     Medications: Patient's Medications  New Prescriptions   No medications on file  Previous Medications   ACETAMINOPHEN (TYLENOL) 650 MG CR TABLET    Take 1,300 mg by mouth 3 (three) times daily.   ALLOPURINOL (ZYLOPRIM) 100 MG TABLET    Take one tablet by mouth once daily.   ATORVASTATIN (LIPITOR) 10 MG TABLET    TAKE 1 TABLET(10 MG) BY MOUTH DAILY   B-D UF III MINI PEN NEEDLES 31G X 5 MM MISC    USE 4 TIMES DAILY   BIKTARVY 50-200-25 MG TABS TABLET    Take 1 tablet by mouth daily.   CALCIUM ACETATE 667 MG TABS    Take 667-2,001 mg by mouth See admin instructions. Take 2001 mg by mouth three  times a day with meals and 667 mg with each snack   CARVEDILOL (COREG) 3.125 MG TABLET    Take 1 tablet (3.125 mg total) by mouth 2 (two) times daily with a meal.   DICLOFENAC SODIUM (VOLTAREN) 1 % GEL    Apply 2 grams topically to  the affected area three times daily as needed for pain.   FOLIC ACID (FOLVITE) 1 MG TABLET    TAKE 1 TABLET(1 MG) BY MOUTH DAILY   GABAPENTIN (NEURONTIN) 300 MG CAPSULE    TAKE 1 CAPSULE BY MOUTH DAILY.   GENTAMICIN OINTMENT (GARAMYCIN) 0.1 %    Apply 1 Application topically daily.   GLUCOSE BLOOD (ONETOUCH ULTRA) TEST STRIP    Check blood sugar three times daily E11.40   INSULIN LISPRO (HUMALOG KWIKPEN [place])    Inject 20 Units into the skin 3 (three) times daily as needed (if blood sugar 150 or greater).   ISOSORBIDE MONONITRATE (IMDUR) 30 MG 24 HR TABLET    Take 1 tablet (30 mg total) by mouth daily.   LANCETS (ONETOUCH DELICA PLUS LANCET33G) MISC    USE 3 TIMES DAILY   LATANOPROST (XALATAN) 0.005 % OPHTHALMIC SOLUTION    Place 1 drop into both eyes at bedtime.   MENTHOL-CAMPHOR (TIGER BALM ARTHRITIS RUB) 11-11 % CREA    Apply 1 Application topically as needed (pain).   MIDODRINE (PROAMATINE) 10 MG TABLET    Take 10 mg by mouth 3 (three) times a week.   MULTIVITAMIN (RENA-VIT) TABS TABLET    Take 1 tablet by mouth daily.   OCTREOTIDE (SANDOSTATIN LAR DEPOT) 10 MG INJECTION    Inject 10 mg into the muscle every 28 days.   OXYCODONE-ACETAMINOPHEN (PERCOCET) 5-325 MG TABLET    Take 1 tablet by mouth every 6 (six) hours as needed for severe pain.   PREVIDENT 5000 PLUS 1.1 % CREA DENTAL CREAM    Place 1 Application onto teeth.   TIMOLOL (TIMOPTIC) 0.5 % OPHTHALMIC SOLUTION    Place 1 drop into both eyes in the morning.   UMECLIDINIUM-VILANTEROL (ANORO ELLIPTA) 62.5-25 MCG/ACT AEPB    Inhale 1 puff into the lungs daily.  Modified Medications   No medications on file  Discontinued Medications   No medications on file    Physical Exam:  Vitals:   06/20/23 1104  BP:  124/76  Pulse: 80  Resp: 17  Temp: 97.7 F (36.5 C)  TempSrc: Temporal  SpO2: 94%  Weight: 201 lb 9.6 oz (91.4 kg)  Height: 6\' 2"  (1.88 m)   Body mass index is 25.88 kg/m. Wt Readings from Last 3 Encounters:  06/20/23 201 lb 9.6 oz (91.4 kg)  06/16/23 193 lb 12.6 oz (87.9 kg)  04/20/23 180 lb (81.6 kg)    Physical Exam Constitutional:      Appearance: Normal appearance.  Cardiovascular:     Rate and Rhythm: Normal rate and regular rhythm.     Pulses: Normal pulses.     Heart sounds: Normal heart sounds. No murmur heard. Pulmonary:     Effort: No respiratory distress.     Breath sounds: No wheezing or rales.  Abdominal:     General: There is no distension.     Tenderness: There is no abdominal tenderness. There is no guarding.  Musculoskeletal:        General: Swelling (Rt leg minimal swelling) present.  Neurological:     Mental Status: He is alert.     Sensory: No sensory deficit.     Motor: No weakness.      Labs reviewed: Basic Metabolic Panel: Recent Labs    07/18/22 1715 07/19/22 0336 07/20/22 0601 07/20/22 1703 06/14/23 0809 06/15/23 0401 06/16/23 0735  NA  --    < > 136   < > 131* 132* 132*  K  --    < >  4.6   < > 6.0* 5.0 5.5*  CL  --    < > 93*   < > 90* 90* 92*  CO2  --    < > 23   < > 26 26 24   GLUCOSE  --    < > 145*   < > 191* 137* 128*  BUN  --    < > 77*   < > 44* 33* 60*  CREATININE 15.58*   < > 10.73*   < > 8.91* 6.71* 9.31*  CALCIUM  --    < > 8.8*   < > 8.6* 8.6* 9.1  MG 2.7*  --  2.0  --   --   --   --   PHOS  --   --  6.9*  --   --  4.2 5.7*   < > = values in this interval not displayed.   Liver Function Tests: Recent Labs    07/18/22 1240 07/19/22 0336 07/20/22 0601 09/30/22 0259 02/02/23 0000 06/15/23 0401 06/16/23 0735  AST 34 33  --  20  --   --   --   ALT 23 22  --  11  --   --   --   ALKPHOS 88 100  --  63 103  --   --   BILITOT 0.7 1.2  --  0.6  --   --   --   PROT 7.6 7.7  --  7.2  --   --   --   ALBUMIN 3.1*  3.1*   < > 3.1* 4.1 2.8* 3.1*   < > = values in this interval not displayed.   Recent Labs    07/18/22 1240  LIPASE 31   No results for input(s): "AMMONIA" in the last 8760 hours. CBC: Recent Labs    09/30/22 0259 10/01/22 0444 02/02/23 0000 06/14/23 0624 06/15/23 0401 06/16/23 0933  WBC 5.6   < > 5.6 4.9 4.7 4.5  NEUTROABS 3.4  --  49.70 3.0  --   --   HGB 10.0*   < > 12.5* 10.3* 10.1* 9.9*  HCT 31.4*   < > 37* 32.1* 31.5* 30.6*  MCV 107.5*   < >  --  107.0* 106.1* 107.4*  PLT 112*   < > 146* 150 130* 131*   < > = values in this interval not displayed.   Lipid Panel: No results for input(s): "CHOL", "HDL", "LDLCALC", "TRIG", "CHOLHDL", "LDLDIRECT" in the last 8760 hours. TSH: No results for input(s): "TSH" in the last 8760 hours. A1C: Lab Results  Component Value Date   HGBA1C 6.8 (H) 06/15/2023     Assessment/Plan  1. Essential hypertension Bp 124/ 76  Cont with the same Avoid salty foods       Type 2 diabetes mellitus with diabetic polyneuropathy, with long-term current use of insulin (HCC) A1c at goal  Avoid high carbohydrate foods Exercise regularly   ESRD on hemodialysis [facility]) Follow with nephrology  Lungs clear  Lower extremity swelling improved - Ambulatory referral to Home Health  Chronic pain syndrome Pt c/o chronic pain in his Rt leg , says he cannot go out on his own to outpatient PT an requesting home health PT  - Ambulatory referral to Home Health - Drug Tox Monitor 1 w/Conf, Oral Fld   High risk medication use - Drug Tox Monitor 1 w/Conf, Oral Fld   I spent greater than 34 minutes for the care of this patient in face  to face time, chart review, clinical documentation, patient education.

## 2023-06-21 ENCOUNTER — Other Ambulatory Visit: Payer: Self-pay

## 2023-06-21 ENCOUNTER — Emergency Department (HOSPITAL_COMMUNITY): Payer: 59

## 2023-06-21 ENCOUNTER — Encounter (HOSPITAL_COMMUNITY): Payer: Self-pay

## 2023-06-21 ENCOUNTER — Emergency Department (HOSPITAL_COMMUNITY)
Admission: EM | Admit: 2023-06-21 | Discharge: 2023-06-21 | Disposition: A | Discharge status: Home / Self Care | Payer: 59 | Attending: Emergency Medicine | Admitting: Emergency Medicine

## 2023-06-21 DIAGNOSIS — Z992 Dependence on renal dialysis: Secondary | ICD-10-CM | POA: Diagnosis not present

## 2023-06-21 DIAGNOSIS — R42 Dizziness and giddiness: Secondary | ICD-10-CM | POA: Diagnosis present

## 2023-06-21 DIAGNOSIS — R69 Illness, unspecified: Secondary | ICD-10-CM | POA: Diagnosis not present

## 2023-06-21 DIAGNOSIS — Z21 Asymptomatic human immunodeficiency virus [HIV] infection status: Secondary | ICD-10-CM | POA: Insufficient documentation

## 2023-06-21 DIAGNOSIS — R0602 Shortness of breath: Secondary | ICD-10-CM | POA: Insufficient documentation

## 2023-06-21 DIAGNOSIS — Z743 Need for continuous supervision: Secondary | ICD-10-CM | POA: Diagnosis not present

## 2023-06-21 DIAGNOSIS — I517 Cardiomegaly: Secondary | ICD-10-CM | POA: Diagnosis not present

## 2023-06-21 DIAGNOSIS — N2581 Secondary hyperparathyroidism of renal origin: Secondary | ICD-10-CM | POA: Diagnosis not present

## 2023-06-21 DIAGNOSIS — R079 Chest pain, unspecified: Secondary | ICD-10-CM | POA: Diagnosis not present

## 2023-06-21 DIAGNOSIS — N186 End stage renal disease: Secondary | ICD-10-CM | POA: Insufficient documentation

## 2023-06-21 DIAGNOSIS — E875 Hyperkalemia: Secondary | ICD-10-CM | POA: Diagnosis not present

## 2023-06-21 DIAGNOSIS — D631 Anemia in chronic kidney disease: Secondary | ICD-10-CM | POA: Diagnosis not present

## 2023-06-21 DIAGNOSIS — I700 Atherosclerosis of aorta: Secondary | ICD-10-CM | POA: Diagnosis not present

## 2023-06-21 DIAGNOSIS — I120 Hypertensive chronic kidney disease with stage 5 chronic kidney disease or end stage renal disease: Secondary | ICD-10-CM | POA: Diagnosis not present

## 2023-06-21 LAB — BASIC METABOLIC PANEL
Anion gap: 20 — ABNORMAL HIGH (ref 5–15)
BUN: 65 mg/dL — ABNORMAL HIGH (ref 8–23)
CO2: 26 mmol/L (ref 22–32)
Calcium: 9.6 mg/dL (ref 8.9–10.3)
Chloride: 89 mmol/L — ABNORMAL LOW (ref 98–111)
Creatinine, Ser: 8.91 mg/dL — ABNORMAL HIGH (ref 0.61–1.24)
GFR, Estimated: 6 mL/min — ABNORMAL LOW (ref >60)
Glucose, Bld: 230 mg/dL — ABNORMAL HIGH (ref 70–99)
Potassium: 5 mmol/L (ref 3.5–5.1)
Sodium: 135 mmol/L (ref 135–145)

## 2023-06-21 LAB — CBC
HCT: 31.4 % — ABNORMAL LOW (ref 39.0–52.0)
Hemoglobin: 9.8 g/dL — ABNORMAL LOW (ref 13.0–17.0)
MCH: 33.4 pg (ref 26.0–34.0)
MCHC: 31.2 g/dL (ref 30.0–36.0)
MCV: 107.2 fL — ABNORMAL HIGH (ref 80.0–100.0)
Platelets: 157 10*3/uL (ref 150–400)
RBC: 2.93 MIL/uL — ABNORMAL LOW (ref 4.22–5.81)
RDW: 14.9 % (ref 11.5–15.5)
WBC: 5.2 10*3/uL (ref 4.0–10.5)
nRBC: 0 % (ref 0.0–0.2)

## 2023-06-21 LAB — CBG MONITORING, ED: Glucose-Capillary: 220 mg/dL — ABNORMAL HIGH (ref 70–99)

## 2023-06-21 LAB — TROPONIN I (HIGH SENSITIVITY): Troponin I (High Sensitivity): 32 ng/L — ABNORMAL HIGH (ref <18)

## 2023-06-21 MED ORDER — SODIUM ZIRCONIUM CYCLOSILICATE 10 G PO PACK
10.0000 g | PACK | Freq: Once | ORAL | Status: AC
  Administered 2023-06-21: 10 g via ORAL
  Filled 2023-06-21: qty 1

## 2023-06-21 NOTE — ED Notes (Signed)
Patient does not make urine.

## 2023-06-21 NOTE — ED Notes (Signed)
Patient assessed by Dr Bebe Shaggy.

## 2023-06-21 NOTE — ED Notes (Signed)
Cab voucher to send home so he can get to dialysis

## 2023-06-21 NOTE — ED Provider Notes (Signed)
[place] EMERGENCY DEPARTMENT AT [facility] Provider Note   CSN: [id number] Arrival date & time: 06/21/23  0122     History  Chief Complaint  Patient presents with   Dizziness    Jacob [name] is a 72 y.o. male.  The history is provided by the patient.   Patient presents for multiple complaints.  Patient has history of HIV, end-stage renal disease presents with shortness of breath and tingling in his hands. Patient reports he was recently in the hospital but decided to leave early against advice because he felt better.  He reports after leaving he was able to get dialysis on August 26.  He has seen his primary care provider since that time.  He reports over the past day he has been having increasing shortness of breath.  It has responded to albuterol. He also reports he feels dizziness upon standing but none at this time.  No focal weakness Patient reports numbness in his fingers but he has had this previously.  He is scheduled for dialysis later this morning  Home Medications Prior to Admission medications   Medication Sig Start Date End Date Taking? Authorizing Provider  acetaminophen (TYLENOL) 650 MG CR tablet Take 1,300 mg by mouth 3 (three) times daily.    [provider]  albuterol (PROVENTIL) (2.5 MG/3ML) 0.083% nebulizer solution Take 3 mLs (2.5 mg total) by nebulization every 6 (six) hours as needed for wheezing or shortness of breath. 06/20/23 06/19/24  [name], Dinah C, NP  allopurinol (ZYLOPRIM) 100 MG tablet Take one tablet by mouth once daily. 03/02/23   [name], MD  atorvastatin (LIPITOR) 10 MG tablet TAKE 1 TABLET(10 MG) BY MOUTH DAILY 11/08/22   [name], MD  B-D UF III MINI PEN NEEDLES 31G X 5 MM MISC USE 4 TIMES DAILY 06/06/22   [name], MD  BIKTARVY 50-200-25 MG TABS tablet Take 1 tablet by mouth daily. 04/11/23   Gardiner Barefoot, MD  Calcium Acetate 667 MG TABS Take 667-2,001 mg by mouth See admin instructions. Take  2001 mg by mouth three times a day with meals and 667 mg with each snack 01/20/21   [provider]  carvedilol (COREG) 3.125 MG tablet Take 1 tablet (3.125 mg total) by mouth 2 (two) times daily with a meal. 08/10/22   [name], MD  diclofenac Sodium (VOLTAREN) 1 % GEL Apply 2 grams topically to the affected area three times daily as needed for pain. 12/19/22   [name], MD  folic acid (FOLVITE) 1 MG tablet TAKE 1 TABLET(1 MG) BY MOUTH DAILY 02/06/23   [name], MD  gabapentin (NEURONTIN) 300 MG capsule TAKE 1 CAPSULE BY MOUTH DAILY. 05/11/22   [name], MD  gentamicin ointment (GARAMYCIN) 0.1 % Apply 1 Application topically daily. Patient not taking: Reported on 06/20/2023 01/06/23   Pilar Plate, DPM  glucose blood [facility] ULTRA) test strip Check blood sugar three times daily E11.40 05/18/23   [name], MD  Insulin Lispro (HUMALOG KWIKPEN [place]) Inject 20 Units into the skin 3 (three) times daily as needed (if blood sugar 150 or greater).    [provider]  isosorbide mononitrate (IMDUR) 30 MG 24 hr tablet Take 1 tablet (30 mg total) by mouth daily. 06/05/23   [name], MD  Lancets [facility] DELICA PLUS [place]) MISC USE 3 TIMES DAILY 08/15/22   [name], MD  latanoprost (XALATAN) 0.005 % ophthalmic  solution Place 1 drop into both eyes at bedtime.    [provider]  Menthol-Camphor (TIGER BALM ARTHRITIS RUB) 11-11 % CREA Apply 1 Application topically as needed (pain).    [provider]  midodrine (PROAMATINE) 10 MG tablet Take 10 mg by mouth 3 (three) times a week. 03/21/23   [provider]  multivitamin (RENA-VIT) TABS tablet Take 1 tablet by mouth daily.    [provider]  octreotide (SANDOSTATIN LAR DEPOT) 10 MG injection Inject 10 mg into the muscle every 28 days. 08/18/22   Hilarie Fredrickson, MD  oxyCODONE-acetaminophen (PERCOCET) 5-325 MG tablet Take 1 tablet by mouth every  6 (six) hours as needed for severe pain. 06/05/23 06/04/24  [name], MD  PREVIDENT 5000 PLUS 1.1 % CREA dental cream Place 1 Application onto teeth. 05/25/23   [provider]  timolol (TIMOPTIC) 0.5 % ophthalmic solution Place 1 drop into both eyes in the morning. 04/04/19   [provider]  umeclidinium-vilanterol (ANORO ELLIPTA) 62.5-25 MCG/ACT AEPB Inhale 1 puff into the lungs daily. 06/07/23   [name], MD      Allergies    Augmentin [amoxicillin-pot clavulanate], Morphine sulfate, Mucinex fast-max, and Amphetamines    Review of Systems   Review of Systems  Respiratory:  Positive for shortness of breath.   Cardiovascular:  Negative for chest pain.  Neurological:  Positive for numbness. Negative for syncope.    Physical Exam Updated Vital Signs BP (!) 158/75   Pulse 83   Temp 98.3 F (36.8 C) (Oral)   Resp 15   Ht 1.88 m (6\' 2" )   Wt 91.2 kg   SpO2 94%   BMI 25.81 kg/m  Physical Exam CONSTITUTIONAL: Chronically ill-appearing, no acute distress HEAD: Normocephalic/atraumatic EYES: EOMI/PERRL ENMT: Mucous membranes moist NECK: supple no meningeal signs CV: S1/S2 noted, no murmurs/rubs/gallops noted LUNGS: Wheezing bilaterally ABDOMEN: soft, nontender NEURO: Pt is awake/alert/appropriate, moves all extremitiesx4.  No facial droop.  No arm or leg drift Equal handgrips EXTREMITIES: Extremities appear well-perfused.  There is no discoloration.  Dialysis access to right arm with thrill noted SKIN: warm, color normal PSYCH: no abnormalities of mood noted, alert and oriented to situation  ED Results / Procedures / Treatments   Labs (all labs ordered are listed, but only abnormal results are displayed) Labs Reviewed  BASIC METABOLIC PANEL - Abnormal; Notable for the following components:      Result Value   Chloride 89 (*)    Glucose, Bld 230 (*)    BUN 65 (*)    Creatinine, Ser 8.91 (*)    GFR, Estimated 6 (*)    Anion gap 20 (*)    All  other components within normal limits  CBC - Abnormal; Notable for the following components:   RBC 2.93 (*)    Hemoglobin 9.8 (*)    HCT 31.4 (*)    MCV 107.2 (*)    All other components within normal limits  CBG MONITORING, ED - Abnormal; Notable for the following components:   Glucose-Capillary 220 (*)    All other components within normal limits  TROPONIN I (HIGH SENSITIVITY) - Abnormal; Notable for the following components:   Troponin I (High Sensitivity) 32 (*)    All other components within normal limits    EKG EKG Interpretation Date/Time:  Wednesday June 21 2023 01:35:24 EDT Ventricular Rate:  80 PR Interval:  216 QRS Duration:  148 QT Interval:  458 QTC Calculation: 528 R Axis:   -  55  Text Interpretation: Sinus rhythm with 1st degree A-V block Right bundle branch block Left anterior fascicular block  Bifascicular block  Inferior infarct , age undetermined T wave abnormality, consider lateral ischemia Abnormal ECG When compared with ECG of 14-Jun-2023 05:25, PREVIOUS ECG IS PRESENT Confirmed by Zadie Rhine ([id number]) on 06/21/2023 2:04:21 AM  Radiology DG Chest 2 View  Result Date: 06/21/2023 CLINICAL DATA:  Chest pain EXAM: CHEST - 2 VIEW COMPARISON:  06/14/2023 FINDINGS: Cardiac shadow is enlarged. Postsurgical changes are again seen and stable. Aortic calcifications. Lungs are well aerated bilaterally. Multiple old rib fractures are noted on the right. No acute abnormality is noted. IMPRESSION: No acute abnormality noted. Electronically Signed   By: Alcide Clever M.D.   On: 06/21/2023 02:32    Procedures Procedures    Medications Ordered in ED Medications  sodium zirconium cyclosilicate (LOKELMA) packet 10 g (10 g Oral Given 06/21/23 0310)    ED Course/ Medical Decision Making/ A&P Clinical Course as of 06/21/23 0402  Wed Jun 21, 2023  0233 Creatinine(!): 8.91 Chronic renal failure [DW]  0233 Hemoglobin(!): 9.8 Anemia [DW]  0401   Patient was just  recently in the hospital but left AMA.  He now reports feeling short of breath   and tingling in his hands.  He is in no acute distress.  He is overall feeling improved.  He tells me he has dialysis arranged for later this morning around 6 AM.  At this point there is no indication for admission, but he would benefit from dialysis. Plan to discharge him directly to home where he will be picked up by his boss to take him to dialysis at 6 AM He was given Lokelma one-time dose for his potassium of 5 [DW]    Clinical Course User Index [DW] Zadie Rhine, MD                                 Medical Decision Making Amount and/or Complexity of Data Reviewed Labs: ordered. Decision-making details documented in ED Course. Radiology: ordered.  Risk Prescription drug management.   This patient presents to the ED for concern of shortness of breath, this involves an extensive number of treatment options, and is a complaint that carries with it a high risk of complications and morbidity.  The differential diagnosis includes but is not limited to Acute coronary syndrome, pneumonia, acute pulmonary edema, pneumothorax, acute anemia, pulmonary embolism    Comorbidities that complicate the patient evaluation: Patient's presentation is complicated by their history of end-stage renal disease, HIV   Additional history obtained: Records reviewed previous admission documents  Lab Tests: I Ordered, and personally interpreted labs.  The pertinent results include: End-stage renal disease, chronic anemia  Imaging Studies ordered: I ordered imaging studies including X-ray chest   I independently visualized and interpreted imaging which showed chronic findings noted, and no acute findings I agree with the radiologist interpretation  Cardiac Monitoring: The patient was maintained on a cardiac monitor.  I personally viewed and interpreted the cardiac monitor which showed an underlying rhythm of:  sinus  rhythm  Medicines ordered and prescription drug management: I ordered medication including Lokelma for mild hyperkalemia  Test Considered: I considered admission, patient appears to be at his baseline in no acute distress  Reevaluation: After the interventions noted above, I reevaluated the patient and found that they have :improved  Complexity of problems addressed: Patient's presentation is most  consistent with  acute presentation with potential threat to life or bodily function  Disposition: After consideration of the diagnostic results and the patient's response to treatment,  I feel that the patent would benefit from discharge   .           Final Clinical Impression(s) / ED Diagnoses Final diagnoses:  ESRD (end stage renal disease) (HCC)  Shortness of breath    Rx / DC Orders ED Discharge Orders     None         Zadie Rhine, MD 06/21/23 [phone]

## 2023-06-21 NOTE — ED Notes (Signed)
Assumed care of patient here c/o dizziness and tingling in hands . Pt is dialysis M/W/F and was admitted recently for same and left AMA. Pt a/o x 4 respirations even and non labored vs wnl

## 2023-06-21 NOTE — ED Triage Notes (Addendum)
Patient arrives from home. Patient get dialysis on MWF and normally compliant. Patient states he started having tingling in the bilateral hands during the day. Tonight he called EMS for shortness of breath, dizziness, and chronic leg swelling. Patient wants a check up. Patient also said he had chest pain today.

## 2023-06-23 DIAGNOSIS — N2581 Secondary hyperparathyroidism of renal origin: Secondary | ICD-10-CM | POA: Diagnosis not present

## 2023-06-23 DIAGNOSIS — I509 Heart failure, unspecified: Secondary | ICD-10-CM | POA: Diagnosis not present

## 2023-06-23 DIAGNOSIS — N186 End stage renal disease: Secondary | ICD-10-CM | POA: Diagnosis not present

## 2023-06-23 DIAGNOSIS — E1140 Type 2 diabetes mellitus with diabetic neuropathy, unspecified: Secondary | ICD-10-CM | POA: Diagnosis not present

## 2023-06-23 DIAGNOSIS — D631 Anemia in chronic kidney disease: Secondary | ICD-10-CM | POA: Diagnosis not present

## 2023-06-23 DIAGNOSIS — Z992 Dependence on renal dialysis: Secondary | ICD-10-CM | POA: Diagnosis not present

## 2023-06-23 DIAGNOSIS — I4891 Unspecified atrial fibrillation: Secondary | ICD-10-CM | POA: Diagnosis not present

## 2023-06-23 DIAGNOSIS — G894 Chronic pain syndrome: Secondary | ICD-10-CM | POA: Diagnosis not present

## 2023-06-24 DIAGNOSIS — Z992 Dependence on renal dialysis: Secondary | ICD-10-CM | POA: Diagnosis not present

## 2023-06-24 DIAGNOSIS — N186 End stage renal disease: Secondary | ICD-10-CM | POA: Diagnosis not present

## 2023-06-24 DIAGNOSIS — I129 Hypertensive chronic kidney disease with stage 1 through stage 4 chronic kidney disease, or unspecified chronic kidney disease: Secondary | ICD-10-CM | POA: Diagnosis not present

## 2023-06-25 ENCOUNTER — Telehealth: Payer: Self-pay | Admitting: Family

## 2023-06-25 ENCOUNTER — Encounter (HOSPITAL_COMMUNITY): Payer: Self-pay

## 2023-06-25 LAB — DRUG TOX MONITOR 1 W/CONF, ORAL FLD
Amphetamines: NEGATIVE ng/mL (ref <10)
Barbiturates: NEGATIVE ng/mL (ref <10)
Benzodiazepines: NEGATIVE ng/mL (ref <0.50)
Buprenorphine: NEGATIVE ng/mL (ref <0.10)
Cocaine: NEGATIVE ng/mL (ref <5.0)
Codeine: NEGATIVE ng/mL (ref <2.5)
Cotinine: 223.8 ng/mL — ABNORMAL HIGH (ref <5.0)
Dihydrocodeine: NEGATIVE ng/mL (ref <2.5)
Fentanyl: NEGATIVE ng/mL (ref <0.10)
Heroin Metabolite: NEGATIVE ng/mL (ref <1.0)
Hydrocodone: NEGATIVE ng/mL (ref <2.5)
Hydromorphone: NEGATIVE ng/mL (ref <2.5)
MARIJUANA: NEGATIVE ng/mL (ref <2.5)
MDMA: NEGATIVE ng/mL (ref <10)
Meprobamate: NEGATIVE ng/mL (ref <2.5)
Methadone: NEGATIVE ng/mL (ref <5.0)
Morphine: NEGATIVE ng/mL (ref <2.5)
Nicotine Metabolite: POSITIVE ng/mL — AB (ref <5.0)
Norhydrocodone: NEGATIVE ng/mL (ref <2.5)
Noroxycodone: 9.2 ng/mL — ABNORMAL HIGH (ref <2.5)
Opiates: POSITIVE ng/mL — AB (ref <2.5)
Oxycodone: 180.9 ng/mL — ABNORMAL HIGH (ref <2.5)
Oxymorphone: 4.5 ng/mL — ABNORMAL HIGH (ref <2.5)
Phencyclidine: NEGATIVE ng/mL (ref <10)
Tapentadol: NEGATIVE ng/mL (ref <5.0)
Tramadol: NEGATIVE ng/mL (ref <5.0)
Zolpidem: NEGATIVE ng/mL (ref <5.0)

## 2023-06-25 NOTE — Telephone Encounter (Signed)
Patient called to check the status of Nebulizer machine ordered on 06/20/2023.ordered was printed and given to Elige Ko to fax to DME company.Bethany please verify or call DME company to check on Status for the Nebulizer then notify patient.

## 2023-06-26 ENCOUNTER — Telehealth: Payer: Self-pay | Admitting: Adult Health

## 2023-06-26 DIAGNOSIS — D631 Anemia in chronic kidney disease: Secondary | ICD-10-CM | POA: Diagnosis not present

## 2023-06-26 DIAGNOSIS — Z992 Dependence on renal dialysis: Secondary | ICD-10-CM | POA: Diagnosis not present

## 2023-06-26 DIAGNOSIS — N186 End stage renal disease: Secondary | ICD-10-CM | POA: Diagnosis not present

## 2023-06-26 DIAGNOSIS — N2581 Secondary hyperparathyroidism of renal origin: Secondary | ICD-10-CM | POA: Diagnosis not present

## 2023-06-27 ENCOUNTER — Telehealth: Payer: Self-pay

## 2023-06-27 ENCOUNTER — Ambulatory Visit: Payer: Self-pay

## 2023-06-27 DIAGNOSIS — R062 Wheezing: Secondary | ICD-10-CM | POA: Diagnosis not present

## 2023-06-27 DIAGNOSIS — G894 Chronic pain syndrome: Secondary | ICD-10-CM | POA: Diagnosis not present

## 2023-06-27 DIAGNOSIS — M869 Osteomyelitis, unspecified: Secondary | ICD-10-CM | POA: Diagnosis not present

## 2023-06-27 DIAGNOSIS — M545 Low back pain, unspecified: Secondary | ICD-10-CM | POA: Diagnosis not present

## 2023-06-27 NOTE — Patient Instructions (Signed)
Visit Information  Thank you for taking time to visit with me today. Please don't hesitate to contact me if I can be of assistance to you.   Following are the goals we discussed today:   Goals Addressed             This Visit's Progress    To have right knee injury evaluated   On track    Care Coordination Interventions: Evaluation of current treatment plan related to impaired physical mobility; chronic pain syndrome and patient's adherence to plan as established by provider Reviewed provider established plan for pain management Discussed importance of adherence to all scheduled medical appointments Advised patient to report to care team affect of pain on daily activities Placed outbound call to [facility], spoke with Sophia, orders were faxed to PCP on 06/23/23, the office did not receive the orders, provided fax # for [facility] in order to refax the PT/OT orders     To receive nebulizer from DME supplier       Care Coordination Interventions: Evaluation of current treatment plan related to shortness of breath  and patient's adherence to plan as established by provider Reviewed and discussed with patient 2 ED visits for fluid overload Review of patient status, including review of consultant's reports, relevant laboratory and other test results, and medications completed Determined patient has yet to receive his nebulizer for breathing treatments as needed for shortness of breath Placed outbound call to [employer], determined patient's nebulizer is scheduled to be picked up by [employer] today, expect 2-4 business days for delivery, patient and PCP provider made aware Instructed patient to notify his PCP of new symptoms or concerns         Our next appointment is by telephone on 06/29/23 at 3:45 PM  Please call the care guide team at [phone] if you need to cancel or reschedule your appointment.   If you are experiencing a Mental Health or Behavioral Health Crisis or need someone  to talk to, please call 1-800-273-TALK (toll free, 24 hour hotline)  The patient verbalized understanding of instructions, educational materials, and care plan provided today and DECLINED offer to receive copy of patient instructions, educational materials, and care plan.   Delsa Sale, RN, BSN, CCM Care Management Coordinator [facility] Care Management  Direct Phone: [phone]

## 2023-06-27 NOTE — Telephone Encounter (Signed)
Gabriel Rung, PT with [facility] called requesting verbal orders for PT 1 time a week for 1 week, 2 times a week for 3 weeks and 1 time a week for 3 weeks. Verbal order was given per [facility] standing order.  Message sent to Dr. Jacquenette Shone [facility])

## 2023-06-27 NOTE — Telephone Encounter (Signed)
Spoke with [employer] this morning to verified for the order of the nebulizer machine and the company will send to the patient's address. All the patient at home but was not available. Left message on voicemail for patient to return call when available   Message to [name], MD and [name], [name]

## 2023-06-27 NOTE — Telephone Encounter (Signed)
Noted  

## 2023-06-27 NOTE — Patient Outreach (Signed)
  Care Coordination   Follow Up Visit Note   06/27/2023 Name: SKIPPY ROHN MRN: [id number] DOB: 1950-12-18  CAMDAN FLORESCA is a 72 y.o. year old male who sees [name], MD for primary care. I spoke with  David Stall by phone today.  What matters to the patients health and wellness today?  Patient would like to start in home PT/OT. He would like to receive his nebulizer in order to treat his shortness of breath.     Goals Addressed             This Visit's Progress    To have right knee injury evaluated   On track    Care Coordination Interventions: Evaluation of current treatment plan related to impaired physical mobility; chronic pain syndrome and patient's adherence to plan as established by provider Reviewed provider established plan for pain management Discussed importance of adherence to all scheduled medical appointments Advised patient to report to care team affect of pain on daily activities Placed outbound call to [facility], spoke with Sophia, orders were faxed to PCP on 06/23/23, the office did not receive the orders, provided fax # for [facility] in order to refax the PT/OT orders     To receive nebulizer from DME supplier       Care Coordination Interventions: Evaluation of current treatment plan related to shortness of breath  and patient's adherence to plan as established by provider Reviewed and discussed with patient 2 ED visits for fluid overload Review of patient status, including review of consultant's reports, relevant laboratory and other test results, and medications completed Determined patient has yet to receive his nebulizer for breathing treatments as needed for shortness of breath Placed outbound call to [employer], determined patient's nebulizer is scheduled to be picked up by [employer] today, expect 2-4 business days for delivery, patient and PCP provider made aware Instructed patient to notify his PCP of new symptoms or concerns       Interventions Today    Flowsheet Row Most Recent Value  Chronic Disease   Chronic disease during today's visit Chronic Kidney Disease/End Stage Renal Disease (ESRD), Other  [chronic pain]  General Interventions   General Interventions Discussed/Reviewed General Interventions Discussed, General Interventions Reviewed, Doctor Visits, Labs, Communication with  Doctor Visits Discussed/Reviewed Doctor Visits Discussed, Doctor Visits Reviewed, PCP, Specialist  Durable Medical Equipment (DME) Other  [nebulizer]  Communication with PCP/Specialists  [PCP provider,  [employer],  [facility]]  Exercise Interventions   Exercise Discussed/Reviewed Exercise Discussed, Exercise Reviewed, Physical Activity  Physical Activity Discussed/Reviewed Physical Activity Discussed, Physical Activity Reviewed  Education Interventions   Education Provided Provided Education  Provided Verbal Education On When to see the doctor, Nutrition, Labs, Exercise, Medication  Pharmacy Interventions   Pharmacy Dicussed/Reviewed Pharmacy Topics Discussed, Pharmacy Topics Reviewed          SDOH assessments and interventions completed:  No     Care Coordination Interventions:  Yes, provided   Follow up plan: Follow up call scheduled for 06/29/23 @3 :45 PM     Encounter Outcome:  Pt. Visit Completed

## 2023-06-27 NOTE — Telephone Encounter (Signed)
[name] confirmed today that DME company received order and was going to mail to patient's home.

## 2023-06-28 ENCOUNTER — Encounter (HOSPITAL_COMMUNITY): Payer: Self-pay

## 2023-06-28 DIAGNOSIS — D631 Anemia in chronic kidney disease: Secondary | ICD-10-CM | POA: Diagnosis not present

## 2023-06-28 DIAGNOSIS — N2581 Secondary hyperparathyroidism of renal origin: Secondary | ICD-10-CM | POA: Diagnosis not present

## 2023-06-28 DIAGNOSIS — Z992 Dependence on renal dialysis: Secondary | ICD-10-CM | POA: Diagnosis not present

## 2023-06-28 DIAGNOSIS — N186 End stage renal disease: Secondary | ICD-10-CM | POA: Diagnosis not present

## 2023-06-28 NOTE — Telephone Encounter (Signed)
[name], MD  You47 minutes ago (8:18 AM)    OK for the PT orders

## 2023-06-29 ENCOUNTER — Ambulatory Visit (HOSPITAL_COMMUNITY)
Admission: RE | Admit: 2023-06-29 | Discharge: 2023-06-29 | Disposition: A | Discharge status: Home / Self Care | Payer: 59 | Source: Ambulatory Visit | Attending: Gastroenterology | Admitting: Gastroenterology

## 2023-06-29 ENCOUNTER — Telehealth: Payer: Self-pay

## 2023-06-29 ENCOUNTER — Ambulatory Visit: Payer: Self-pay

## 2023-06-29 DIAGNOSIS — I5042 Chronic combined systolic (congestive) and diastolic (congestive) heart failure: Secondary | ICD-10-CM | POA: Diagnosis not present

## 2023-06-29 DIAGNOSIS — D649 Anemia, unspecified: Secondary | ICD-10-CM | POA: Diagnosis not present

## 2023-06-29 DIAGNOSIS — I132 Hypertensive heart and chronic kidney disease with heart failure and with stage 5 chronic kidney disease, or end stage renal disease: Secondary | ICD-10-CM | POA: Diagnosis not present

## 2023-06-29 DIAGNOSIS — E1122 Type 2 diabetes mellitus with diabetic chronic kidney disease: Secondary | ICD-10-CM | POA: Diagnosis not present

## 2023-06-29 DIAGNOSIS — N186 End stage renal disease: Secondary | ICD-10-CM | POA: Diagnosis not present

## 2023-06-29 DIAGNOSIS — D631 Anemia in chronic kidney disease: Secondary | ICD-10-CM | POA: Diagnosis not present

## 2023-06-29 DIAGNOSIS — E1142 Type 2 diabetes mellitus with diabetic polyneuropathy: Secondary | ICD-10-CM | POA: Diagnosis not present

## 2023-06-29 DIAGNOSIS — Q2730 Arteriovenous malformation, site unspecified: Secondary | ICD-10-CM | POA: Diagnosis not present

## 2023-06-29 MED ORDER — OCTREOTIDE ACETATE 10 MG IM KIT
10.0000 mg | PACK | INTRAMUSCULAR | Status: DC
  Administered 2023-06-29: 10 mg via INTRAMUSCULAR
  Filled 2023-06-29: qty 1

## 2023-06-29 NOTE — Telephone Encounter (Signed)
[name], MD  You19 minutes ago (9:56 AM)    He needs to be seen in clinic.

## 2023-06-29 NOTE — Patient Instructions (Signed)
Visit Information  Thank you for taking time to visit with me today. Please don't hesitate to contact me if I can be of assistance to you.   Following are the goals we discussed today:   Goals Addressed             This Visit's Progress    To have nasal congestion evaluated       Care Coordination Interventions: Evaluation of current treatment plan related to nasal congestion  and patient's adherence to plan as established by provider Determined patient will f/u with PCP provider tomorrow for evaluation of his nasal congestion Discussed patient may chose to switch to a new PCP provider due to having recent changes at his current PCP office Educated patient about [facility], he will let this RN know if he wishes to be referred to [facility]         Our next appointment is by telephone on 08/01/23 at 12:00 PM   Please call the care guide team at [phone] if you need to cancel or reschedule your appointment.   If you are experiencing a Mental Health or Behavioral Health Crisis or need someone to talk to, please call 1-800-273-TALK (toll free, 24 hour hotline)  The patient verbalized understanding of instructions, educational materials, and care plan provided today and DECLINED offer to receive copy of patient instructions, educational materials, and care plan.   Delsa Sale, RN, BSN, CCM Care Management Coordinator [facility] Care Management  Direct Phone: [phone]

## 2023-06-29 NOTE — Telephone Encounter (Signed)
Patient scheduled appointment 06/30/23

## 2023-06-29 NOTE — Telephone Encounter (Signed)
Patient called complaining about lots of congestion and requesting an antibiotic. Patient stated that he spoke with oncall provider last night and was instructed to go to ED. HE says that he couldn't go to ED because he has an infusion appointment this morning. Please advise

## 2023-06-29 NOTE — Patient Outreach (Signed)
  Care Coordination   Follow Up Visit Note   06/29/2023 Name: Jacob [name] MRN: [id number] DOB: 25-Jan-1951  Jacob [name] is a 72 y.o. year old male who sees Jacob Sheffield, MD for primary care. I spoke with  Jacob [name] by phone today.  What matters to the patients health and wellness today?  Patient will f/u with his PCP for evaluation of nasal congestion. He may consider switching to a new PCP provider.     Goals Addressed             This Visit's Progress    To have nasal congestion evaluated       Care Coordination Interventions: Evaluation of current treatment plan related to nasal congestion  and patient's adherence to plan as established by provider Determined patient will f/u with PCP provider tomorrow for evaluation of his nasal congestion Discussed patient may chose to switch to a new PCP provider due to having recent changes at his current PCP office Educated patient about [facility], he will let this RN know if he wishes to be referred to [facility]     Interventions Today    Flowsheet Row Most Recent Value  Chronic Disease   Chronic disease during today's visit Chronic Kidney Disease/End Stage Renal Disease (ESRD), Other  [nasal congestion]  General Interventions   General Interventions Discussed/Reviewed General Interventions Discussed, General Interventions Reviewed, Doctor Visits, Durable Medical Equipment (DME)  Doctor Visits Discussed/Reviewed Doctor Visits Reviewed, Doctor Visits Discussed, PCP  Durable Medical Equipment (DME) Other  [nebulizer]  Education Interventions   Education Provided Provided Education  Provided Verbal Education On When to see the doctor          SDOH assessments and interventions completed:  No     Care Coordination Interventions:  Yes, provided   Follow up plan: Follow up call scheduled for 08/01/23 @12 :00 PM     Encounter Outcome:  Patient Visit Completed

## 2023-06-30 ENCOUNTER — Ambulatory Visit (INDEPENDENT_AMBULATORY_CARE_PROVIDER_SITE_OTHER): Payer: 59 | Admitting: Family

## 2023-06-30 ENCOUNTER — Encounter: Payer: Self-pay | Admitting: Family

## 2023-06-30 VITALS — BP 110/70 | HR 66 | Temp 97.6°F | Resp 16 | Ht 74.0 in | Wt 199.0 lb

## 2023-06-30 DIAGNOSIS — N186 End stage renal disease: Secondary | ICD-10-CM | POA: Diagnosis not present

## 2023-06-30 DIAGNOSIS — Z992 Dependence on renal dialysis: Secondary | ICD-10-CM | POA: Diagnosis not present

## 2023-06-30 DIAGNOSIS — R051 Acute cough: Secondary | ICD-10-CM

## 2023-06-30 DIAGNOSIS — N2581 Secondary hyperparathyroidism of renal origin: Secondary | ICD-10-CM | POA: Diagnosis not present

## 2023-06-30 DIAGNOSIS — D631 Anemia in chronic kidney disease: Secondary | ICD-10-CM | POA: Diagnosis not present

## 2023-06-30 MED ORDER — DOXYCYCLINE HYCLATE 100 MG PO TABS
100.0000 mg | ORAL_TABLET | Freq: Two times a day (BID) | ORAL | 0 refills | Status: AC
Start: 2023-06-30 — End: 2023-07-07

## 2023-06-30 NOTE — Progress Notes (Signed)
Provider: Josia Cueva FNP-C  [name], MD  Patient Care Team: [name], MD as PCP - General (Internal Medicine) [name], MD (Inactive) as PCP - Infectious Diseases (Infectious Diseases) [name], MD as PCP - Cardiology (Cardiology) [name], MD as Consulting Physician (Ophthalmology) [name], MD as Consulting Physician (Cardiology) [name], MD (Inactive) as Consulting Physician (Infectious Diseases) [name], DPM as Consulting Physician (Podiatry) Brailey Buescher, [name], NP as Nurse Practitioner (Family Medicine) [place], [facility], [facility]  Extended Emergency Contact Information Primary Emergency Contact: Marnette Burgess [place] of [place] Home Phone: [phone] Mobile Phone: [phone] Relation: Sister Secondary Emergency Contact: [facility] Home Phone: [phone] Mobile Phone: [phone] Relation: Sister  Code Status:  Full Code  Goals of care: Advanced Directive information    06/30/2023    1:20 PM  Advanced Directives  Does Patient Have a Medical Advance Directive? No     Chief Complaint  Patient presents with   Acute Visit    Congestion, cough, body aches, diagnosed with pneumonia at hospital    HPI:  Pt is a 72 y.o. male seen today for an acute visit for evaluation of cough,congestion and body aches.states was diagnosed with Pneumonia.He denies any fever,chills or shortness of breath.    Past Medical History:  Diagnosis Date   Acute respiratory failure (HCC) 03/01/2018   Anemia    Arthritis    "all over; mostly knees and back" (02/28/2018)   Chronic combined systolic and diastolic CHF (congestive heart failure) (HCC)    Chronic lower back pain    stenosis   Community acquired pneumonia 09/06/2013   COPD (chronic obstructive pulmonary disease) (HCC)    Coronary atherosclerosis of native coronary artery    a. 02/2003 s/p CABG x 2 (VG->RI, VG->RPDA; b.  11/2019 PCI: LM nl, LAD 90d, D3 50, RI 100, LCX 100p, OM3 100 - fills via L->L collats from D2/dLAD, RCA 100p, VG->RPDA ok, VG->RI 95 (3.5x48 Synergy XD DES).   Drug abuse (HCC)    hx; tested for cocaine as recently as 2/08. says he is not using drugs now - avoided defib. for this reason    ESRD (end stage renal disease) (HCC)    Hemo M-W-F- [facility]   Fall at home 10/2020   GERD (gastroesophageal reflux disease)    takes OTC meds as needed   GI bleeding    a. 11/2019 EGD: angiodysplastic lesions w/ bleeding s/p argon plasma/clipping/epi inj. Multiple admissions for the same.   Glaucoma    uses eye drops daily   Hepatitis B 1968   "tx'd w/isolation; caught it from toilet stools in gym"   History of blood transfusion 03/01/2019   History of colon polyps    benign   History of gout    takes Allopurinol daily as well as Colchicine-if needed (02/28/2018)   History of kidney stones    HTN (hypertension)    takes Coreg,Imdur.and Apresoline daily   Human immunodeficiency virus (HIV) disease (HCC) dx'd 1995   on Biktarvy as of 12/2020.     Hyperlipidemia    Ischemic cardiomyopathy    a. 01/2019 Echo: EF 40-45%, diffuse HK, mild basal septal hypertrophy. Diast dysfxn. Nl RV size/fxn. Sev dil LA. Triv MR/TR/PR.   Muscle spasm    takes Zanaflex as needed   Myocardial infarction (HCC) ~ 2004/2005   Nocturia    Peripheral neuropathy    takes gabapentin daily   Pneumonia    "at least twice" (02/28/2018)  SDH (subdural hematoma) (HCC)    Syphilis, unspecified    Type II diabetes mellitus (HCC) 2004   Lantus daily.Average fasting blood sugar 125-199   Wears glasses    Wears partial dentures    Past Surgical History:  Procedure Laterality Date   AV FISTULA PLACEMENT Left 08/02/2018   Procedure: ARTERIOVENOUS (AV) FISTULA CREATION  left arm radiocephlic;  Surgeon: Cephus Shelling, MD;  Location[facility] OR;  Service: Vascular;  Laterality: Left;   AV FISTULA PLACEMENT Left 08/01/2019    Procedure: LEFT BRACHIOCEPHALIC ARTERIOVENOUS (AV) FISTULA CREATION;  Surgeon: Larina Earthly, MD;  Location: [facility] OR;  Service: Vascular;  Laterality: Left;   AV FISTULA PLACEMENT Right 04/12/2022   Procedure: RIGHT UPPER EXTREMITY ARTERIOVENOUS (AV)  GRAFT INSERTION USING GORE STRETCH 4-7 MM;  Surgeon: Chuck Hint, MD;  Location[facility] OR;  Service: Vascular;  Laterality: Right;   BASCILIC VEIN TRANSPOSITION Left 10/03/2019   Procedure: BASILIC VEIN TRANSPOSITION LEFT SECOND STAGE;  Surgeon: Larina Earthly, MD;  Location: [facility] OR;  Service: Vascular;  Laterality: Left;   BIOPSY  01/25/2021   Procedure: BIOPSY;  Surgeon: Sherrilyn Rist, MD;  Location: [facility] ENDOSCOPY;  Service: Gastroenterology;;   CARDIAC CATHETERIZATION  10/2002; 12/19/2004   Hattie Perch 03/08/2011   COLONOSCOPY  2013   Dr.John Marina Goodell    CORONARY ARTERY BYPASS GRAFT  02/24/2003   CABG X2/notes 03/08/2011   CORONARY STENT INTERVENTION N/A 12/19/2019   Procedure: CORONARY STENT INTERVENTION;  Surgeon: Corky Crafts, MD;  Location: [facility] INVASIVE CV LAB;  Service: Cardiovascular;  Laterality: N/A;   CORONARY ULTRASOUND/IVUS N/A 12/19/2019   Procedure: Intravascular Ultrasound/IVUS;  Surgeon: Corky Crafts, MD;  Location: [facility] INVASIVE CV LAB;  Service: Cardiovascular;  Laterality: N/A;   ENTEROSCOPY N/A 01/25/2021   Procedure: ENTEROSCOPY;  Surgeon: Sherrilyn Rist, MD;  Location: [facility] ENDOSCOPY;  Service: Gastroenterology;  Laterality: N/A;   ENTEROSCOPY N/A 02/13/2021   Procedure: ENTEROSCOPY;  Surgeon: Lynann Bologna, MD;  Location: [facility] ENDOSCOPY;  Service: Endoscopy;  Laterality: N/A;   ENTEROSCOPY N/A 05/07/2021   Procedure: ENTEROSCOPY;  Surgeon: Benancio Deeds, MD;  Location: [facility] ENDOSCOPY;  Service: Gastroenterology;  Laterality: N/A;   ESOPHAGOGASTRODUODENOSCOPY (EGD) WITH PROPOFOL N/A 02/08/2019   Procedure: ESOPHAGOGASTRODUODENOSCOPY (EGD) WITH PROPOFOL;  Surgeon: Rachael Fee, MD;  Location: [facility] ENDOSCOPY;   Service: Gastroenterology;  Laterality: N/A;   ESOPHAGOGASTRODUODENOSCOPY (EGD) WITH PROPOFOL N/A 12/22/2019   Procedure: ESOPHAGOGASTRODUODENOSCOPY (EGD) WITH PROPOFOL;  Surgeon: Shellia Cleverly, DO;  Location: [facility] ENDOSCOPY;  Service: Gastroenterology;  Laterality: N/A;   ESOPHAGOGASTRODUODENOSCOPY (EGD) WITH PROPOFOL N/A 10/19/2020   Procedure: ESOPHAGOGASTRODUODENOSCOPY (EGD) WITH PROPOFOL;  Surgeon: Lynann Bologna, MD;  Location: [facility] ENDOSCOPY;  Service: Endoscopy;  Laterality: N/A;   ESOPHAGOGASTRODUODENOSCOPY (EGD) WITH PROPOFOL N/A 12/22/2020   Procedure: ESOPHAGOGASTRODUODENOSCOPY (EGD) WITH PROPOFOL;  Surgeon: Iva Boop, MD;  Location: [facility] ENDOSCOPY;  Service: Endoscopy;  Laterality: N/A;   ESOPHAGOGASTRODUODENOSCOPY (EGD) WITH PROPOFOL N/A 01/09/2021   Procedure: ESOPHAGOGASTRODUODENOSCOPY (EGD) WITH PROPOFOL;  Surgeon: Hilarie Fredrickson, MD;  Location: [facility] ENDOSCOPY;  Service: Endoscopy;  Laterality: N/A;   HEMOSTASIS CLIP PLACEMENT  12/22/2019   Procedure: HEMOSTASIS CLIP PLACEMENT;  Surgeon: Shellia Cleverly, DO;  Location: [facility] ENDOSCOPY;  Service: Gastroenterology;;   HEMOSTASIS CLIP PLACEMENT  12/22/2020   Procedure: HEMOSTASIS CLIP PLACEMENT;  Surgeon: Iva Boop, MD;  Location: [facility] ENDOSCOPY;  Service: Endoscopy;;   HEMOSTASIS CONTROL  12/22/2020   Procedure: HEMOSTASIS CONTROL/hemospray;  Surgeon: Iva Boop, MD;  Location: [facility] ENDOSCOPY;  Service: Endoscopy;;   HOT HEMOSTASIS N/A 02/08/2019   Procedure: HOT HEMOSTASIS (ARGON PLASMA COAGULATION/BICAP);  Surgeon: Rachael Fee, MD;  Location: [facility] ENDOSCOPY;  Service: Gastroenterology;  Laterality: N/A;   HOT HEMOSTASIS N/A 12/22/2019   Procedure: HOT HEMOSTASIS (ARGON PLASMA COAGULATION/BICAP);  Surgeon: Shellia Cleverly, DO;  Location: [facility] ENDOSCOPY;  Service: Gastroenterology;  Laterality: N/A;   HOT HEMOSTASIS N/A 10/19/2020   Procedure: HOT HEMOSTASIS (ARGON PLASMA COAGULATION/BICAP);  Surgeon: Lynann Bologna, MD;  Location:  [facility] ENDOSCOPY;  Service: Endoscopy;  Laterality: N/A;   HOT HEMOSTASIS N/A 12/22/2020   Procedure: HOT HEMOSTASIS (ARGON PLASMA COAGULATION/BICAP);  Surgeon: Iva Boop, MD;  Location: [facility] ENDOSCOPY;  Service: Endoscopy;  Laterality: N/A;   HOT HEMOSTASIS N/A 01/09/2021   Procedure: HOT HEMOSTASIS (ARGON PLASMA COAGULATION/BICAP);  Surgeon: Hilarie Fredrickson, MD;  Location: [facility] ENDOSCOPY;  Service: Endoscopy;  Laterality: N/A;   HOT HEMOSTASIS N/A 01/25/2021   Procedure: HOT HEMOSTASIS (ARGON PLASMA COAGULATION/BICAP);  Surgeon: Sherrilyn Rist, MD;  Location: [facility] ENDOSCOPY;  Service: Gastroenterology;  Laterality: N/A;   HOT HEMOSTASIS N/A 02/13/2021   Procedure: HOT HEMOSTASIS (ARGON PLASMA COAGULATION/BICAP);  Surgeon: Lynann Bologna, MD;  Location: [facility] ENDOSCOPY;  Service: Endoscopy;  Laterality: N/A;   HOT HEMOSTASIS N/A 05/07/2021   Procedure: HOT HEMOSTASIS (ARGON PLASMA COAGULATION/BICAP);  Surgeon: Benancio Deeds, MD;  Location: [facility] ENDOSCOPY;  Service: Gastroenterology;  Laterality: N/A;   INSERTION OF DIALYSIS CATHETER N/A 02/08/2022   Procedure: INSERTION OF TUNNELED DIALYSIS CATHETER;  Surgeon: Chuck Hint, MD;  Location: [facility] OR;  Service: Vascular;  Laterality: N/A;   INTERTROCHANTERIC HIP FRACTURE SURGERY Left 11/2006   Hattie Perch 03/08/2011   IR FLUORO GUIDE CV LINE LEFT  03/14/2022   IR FLUORO GUIDE CV LINE RIGHT  07/24/2019   IR FLUORO GUIDE CV LINE RIGHT  07/30/2019   IR US GUIDE VASC ACCESS RIGHT  07/24/2019   IR US GUIDE VASC ACCESS RIGHT  07/30/2019   LAPAROSCOPIC CHOLECYSTECTOMY  05/2006   LIGATION OF ARTERIOVENOUS  FISTULA Left 02/08/2022   Procedure: LIGATION OF LEFT ARTERIOVENOUS  FISTULA;  Surgeon: Chuck Hint, MD;  Location: [facility] OR;  Service: Vascular;  Laterality: Left;   LIGATION OF COMPETING BRANCHES OF ARTERIOVENOUS FISTULA Left 11/05/2018   Procedure: LIGATION OF COMPETING BRANCHES OF ARTERIOVENOUS FISTULA  LEFT  ARM;  Surgeon: Cephus Shelling, MD;   Location: [facility] OR;  Service: Vascular;  Laterality: Left;   LUMBAR LAMINECTOMY/DECOMPRESSION MICRODISCECTOMY N/A 02/29/2016   Procedure: Left L4-5 Lateral Recess Decompression, Removal Extradural Intraspinal Facet Cyst;  Surgeon: Eldred Manges, MD;  Location: [facility] OR;  Service: Orthopedics;  Laterality: N/A;   METATARSAL HEAD EXCISION Right 07/20/2022   Procedure: Irragation and debridement of right foot wound with extraction of all necrotic soft tissue and bone;  Surgeon: Pilar Plate, DPM;  Location: [facility] OR;  Service: Podiatry;  Laterality: Right;  Right 4th Met head and base of proximal phalanx resection   MULTIPLE TOOTH EXTRACTIONS     ORIF MANDIBULAR FRACTURE Left 08/13/2004   ORIF of left body fracture mandible with KLS Martin 2.3-mm six hole/notes 03/08/2011   RIGHT/LEFT HEART CATH AND CORONARY/GRAFT ANGIOGRAPHY N/A 12/19/2019   Procedure: RIGHT/LEFT HEART CATH AND CORONARY/GRAFT ANGIOGRAPHY;  Surgeon: Corky Crafts, MD;  Location: [facility] INVASIVE CV LAB;  Service: Cardiovascular;  Laterality: N/A;   SCLEROTHERAPY  12/22/2019   Procedure: SCLEROTHERAPY;  Surgeon: Shellia Cleverly, DO;  Location: [facility] ENDOSCOPY;  Service: Gastroenterology;;  SCLEROTHERAPY  02/13/2021   Procedure: SCLEROTHERAPY;  Surgeon: Lynann Bologna, MD;  Location: [facility] ENDOSCOPY;  Service: Endoscopy;;    Allergies  Allergen Reactions   Augmentin [Amoxicillin-Pot Clavulanate] Diarrhea and Other (See Comments)    Severe diarrhea   Morphine Sulfate Anaphylaxis    Can't mix with oxycodone    Mucinex Fast-Max Other (See Comments)    Intense sweating    Amphetamines Other (See Comments)    Unknown reaction    Outpatient Encounter Medications as of 06/30/2023  Medication Sig   acetaminophen (TYLENOL) 650 MG CR tablet Take 1,300 mg by mouth 3 (three) times daily.   albuterol (PROVENTIL) (2.5 MG/3ML) 0.083% nebulizer solution Take 3 mLs (2.5 mg total) by nebulization every 6 (six) hours as needed for wheezing or  shortness of breath.   allopurinol (ZYLOPRIM) 100 MG tablet Take one tablet by mouth once daily.   atorvastatin (LIPITOR) 10 MG tablet TAKE 1 TABLET(10 MG) BY MOUTH DAILY   B-D UF III MINI PEN NEEDLES 31G X 5 MM MISC USE 4 TIMES DAILY   BIKTARVY 50-200-25 MG TABS tablet Take 1 tablet by mouth daily.   Calcium Acetate 667 MG TABS Take 667-2,001 mg by mouth See admin instructions. Take 2001 mg by mouth three times a day with meals and 667 mg with each snack   carvedilol (COREG) 3.125 MG tablet Take 1 tablet (3.125 mg total) by mouth 2 (two) times daily with a meal.   diclofenac Sodium (VOLTAREN) 1 % GEL Apply 2 grams topically to the affected area three times daily as needed for pain.   folic acid (FOLVITE) 1 MG tablet TAKE 1 TABLET(1 MG) BY MOUTH DAILY   gabapentin (NEURONTIN) 300 MG capsule TAKE 1 CAPSULE BY MOUTH DAILY.   gentamicin ointment (GARAMYCIN) 0.1 % Apply 1 Application topically daily.   glucose blood (ONETOUCH ULTRA) test strip Check blood sugar three times daily E11.40   Insulin Lispro (HUMALOG KWIKPEN [place]) Inject 20 Units into the skin 3 (three) times daily as needed (if blood sugar 150 or greater).   isosorbide mononitrate (IMDUR) 30 MG 24 hr tablet Take 1 tablet (30 mg total) by mouth daily.   Lancets (ONETOUCH DELICA PLUS LANCET33G) MISC USE 3 TIMES DAILY   latanoprost (XALATAN) 0.005 % ophthalmic solution Place 1 drop into both eyes at bedtime.   Menthol-Camphor (TIGER BALM ARTHRITIS RUB) 11-11 % CREA Apply 1 Application topically as needed (pain).   midodrine (PROAMATINE) 10 MG tablet Take 10 mg by mouth 3 (three) times a week.   multivitamin (RENA-VIT) TABS tablet Take 1 tablet by mouth daily.   octreotide (SANDOSTATIN LAR DEPOT) 10 MG injection Inject 10 mg into the muscle every 28 days.   oxyCODONE-acetaminophen (PERCOCET) 5-325 MG tablet Take 1 tablet by mouth every 6 (six) hours as needed for severe pain.   PREVIDENT 5000 PLUS 1.1 % CREA dental cream Place 1 Application  onto teeth.   timolol (TIMOPTIC) 0.5 % ophthalmic solution Place 1 drop into both eyes in the morning.   umeclidinium-vilanterol (ANORO ELLIPTA) 62.5-25 MCG/ACT AEPB Inhale 1 puff into the lungs daily.   No facility-administered encounter medications on file as of 06/30/2023.    Review of Systems  Constitutional:  Negative for appetite change, chills, fatigue, fever and unexpected weight change.  HENT:  Positive for congestion. Negative for dental problem, ear discharge, ear pain, facial swelling, hearing loss, nosebleeds, postnasal drip, rhinorrhea, sinus pressure, sinus pain, sneezing, sore throat, tinnitus and trouble swallowing.   Eyes:  Negative for pain, discharge, redness, itching and visual disturbance.  Respiratory:  Positive for cough. Negative for chest tightness, shortness of breath and wheezing.   Cardiovascular:  Negative for chest pain, palpitations and leg swelling.  Gastrointestinal:  Negative for abdominal distention, abdominal pain, diarrhea, nausea and vomiting.  Musculoskeletal:  Positive for gait problem. Negative for back pain, joint swelling, myalgias, neck pain and neck stiffness.  Skin:  Negative for color change, pallor and rash.  Neurological:  Negative for dizziness, weakness, light-headedness, numbness and headaches.    Immunization History  Administered Date(s) Administered   Fluad Quad(high Dose 65+) 06/14/2019, 07/20/2021, 08/04/2022   Hepatitis B 08/28/2006, 10/02/2007, 04/01/2008   Hepatitis B, ADULT 06/03/2014, 07/04/2014   Influenza Split 07/28/2011   Influenza Whole 08/28/2006, 09/10/2007, 09/15/2008, 08/03/2009, 07/26/2010   Influenza, High Dose Seasonal PF 07/04/2018   Influenza,inj,Quad PF,6+ Mos 07/04/2014, 07/06/2015, 07/12/2016, 07/11/2017   Influenza-Unspecified 06/24/2013, 06/30/2020   PFIZER(Purple Top)SARS-COV-2 Vaccination 11/16/2019, 12/07/2019, 07/23/2020   Pfizer Covid-19 Vaccine Bivalent Booster 64yrs & up 07/15/2021, 02/09/2023    Pneumococcal Conjugate-13 08/18/2016   Pneumococcal Polysaccharide-23 08/28/2006, 07/28/2011, 05/30/2018   Tdap 06/14/2019   Zoster Recombinant(Shingrix) 07/24/2017, 01/09/2018   Zoster, Live 06/03/2014   Pertinent  Health Maintenance Due  Topic Date Due   LIPID PANEL  02/25/2023   INFLUENZA VACCINE  05/25/2023   HEMOGLOBIN A1C  09/15/2023   OPHTHALMOLOGY EXAM  10/28/2023   FOOT EXAM  02/22/2024   Colonoscopy  09/30/2032      10/01/2022    2:15 AM 10/13/2022   11:42 AM 02/14/2023   12:56 PM 06/20/2023   11:03 AM 06/30/2023    1:20 PM  Fall Risk  Falls in the past year?   1 0 0  Was there an injury with Fall?   1 0   Fall Risk Category Calculator   3 0   (RETIRED) Patient Fall Risk Level Moderate fall risk Moderate fall risk     Patient at Risk for Falls Due to   History of fall(s) No Fall Risks   Fall risk Follow up   Falls evaluation completed Falls evaluation completed    Functional Status Survey:    Vitals:   06/30/23 1320 06/30/23 1329 06/30/23 1334  BP: 110/70    Pulse: 66    Resp: 16    Temp: 97.6 F (36.4 C)    SpO2: (!) 80% 93% 99%  Weight: 199 lb (90.3 kg)    Height: 6\' 2"  (1.88 m)     Body mass index is 25.55 kg/m. Physical Exam Vitals reviewed.  Constitutional:      General: He is not in acute distress.    Appearance: Normal appearance. He is overweight. He is not ill-appearing or diaphoretic.  HENT:     Head: Normocephalic.     Right Ear: Tympanic membrane, ear canal and external ear normal. There is no impacted cerumen.     Left Ear: Tympanic membrane, ear canal and external ear normal. There is no impacted cerumen.     Nose: Nose normal. No congestion or rhinorrhea.     Mouth/Throat:     Mouth: Mucous membranes are moist.     Pharynx: Oropharynx is clear. No oropharyngeal exudate or posterior oropharyngeal erythema.  Eyes:     General: No scleral icterus.       Right eye: No discharge.        Left eye: No discharge.     Extraocular Movements:  Extraocular movements intact.     Conjunctiva/sclera:  Conjunctivae normal.     Pupils: Pupils are equal, round, and reactive to light.  Neck:     Vascular: No carotid bruit.  Cardiovascular:     Rate and Rhythm: Normal rate and regular rhythm.     Pulses: Normal pulses.     Heart sounds: Normal heart sounds. No murmur heard.    No friction rub. No gallop.  Pulmonary:     Effort: Pulmonary effort is normal. No respiratory distress.     Breath sounds: Examination of the left-middle field reveals decreased breath sounds. Examination of the left-lower field reveals decreased breath sounds. Decreased breath sounds present. No wheezing, rhonchi or rales.  Chest:     Chest wall: No tenderness.  Abdominal:     General: Bowel sounds are normal. There is no distension.     Palpations: Abdomen is soft. There is no mass.     Tenderness: There is no abdominal tenderness. There is no right CVA tenderness, left CVA tenderness, guarding or rebound.  Musculoskeletal:        General: No swelling or tenderness. Normal range of motion.     Cervical back: Normal range of motion. No rigidity or tenderness.     Right lower leg: No edema.     Left lower leg: No edema.  Lymphadenopathy:     Cervical: No cervical adenopathy.  Skin:    General: Skin is warm and dry.     Coloration: Skin is not pale.     Findings: No erythema or rash.  Neurological:     Mental Status: He is alert and oriented to person, place, and time.     Motor: No weakness.     Gait: Gait abnormal.  Psychiatric:        Mood and Affect: Mood normal.        Speech: Speech normal.        Behavior: Behavior normal.     Labs reviewed: Recent Labs    07/18/22 1715 07/19/22 0336 07/20/22 0601 07/20/22 1703 06/15/23 0401 06/16/23 0735 06/21/23 0134  NA  --    < > 136   < > 132* 132* 135  K  --    < > 4.6   < > 5.0 5.5* 5.0  CL  --    < > 93*   < > 90* 92* 89*  CO2  --    < > 23   < > 26 24 26   GLUCOSE  --    < > 145*   < > 137*  128* 230*  BUN  --    < > 77*   < > 33* 60* 65*  CREATININE 15.58*   < > 10.73*   < > 6.71* 9.31* 8.91*  CALCIUM  --    < > 8.8*   < > 8.6* 9.1 9.6  MG 2.7*  --  2.0  --   --   --   --   PHOS  --   --  6.9*  --  4.2 5.7*  --    < > = values in this interval not displayed.   Recent Labs    07/18/22 1240 07/19/22 0336 07/20/22 0601 09/30/22 0259 02/02/23 0000 06/15/23 0401 06/16/23 0735  AST 34 33  --  20  --   --   --   ALT 23 22  --  11  --   --   --   ALKPHOS 88 100  --  63 103  --   --  BILITOT 0.7 1.2  --  0.6  --   --   --   PROT 7.6 7.7  --  7.2  --   --   --   ALBUMIN 3.1* 3.1*   < > 3.1* 4.1 2.8* 3.1*   < > = values in this interval not displayed.   Recent Labs    09/30/22 0259 10/01/22 0444 02/02/23 0000 06/14/23 0624 06/15/23 0401 06/16/23 0933 06/21/23 0134  WBC 5.6   < > 5.6 4.9 4.7 4.5 5.2  NEUTROABS 3.4  --  49.70 3.0  --   --   --   HGB 10.0*   < > 12.5* 10.3* 10.1* 9.9* 9.8*  HCT 31.4*   < > 37* 32.1* 31.5* 30.6* 31.4*  MCV 107.5*   < >  --  107.0* 106.1* 107.4* 107.2*  PLT 112*   < > 146* 150 130* 131* 157   < > = values in this interval not displayed.   Lab Results  Component Value Date   TSH 0.49 12/15/2020   Lab Results  Component Value Date   HGBA1C 6.8 (H) 06/15/2023   Lab Results  Component Value Date   CHOL 154 02/24/2022   HDL 34 (L) 02/24/2022   LDLCALC 80 02/24/2022   TRIG 332 (H) 02/24/2022   CHOLHDL 4.5 02/24/2022    Significant Diagnostic Results in last 30 days:  DG Chest 2 View  Result Date: 06/21/2023 CLINICAL DATA:  Chest pain EXAM: CHEST - 2 VIEW COMPARISON:  06/14/2023 FINDINGS: Cardiac shadow is enlarged. Postsurgical changes are again seen and stable. Aortic calcifications. Lungs are well aerated bilaterally. Multiple old rib fractures are noted on the right. No acute abnormality is noted. IMPRESSION: No acute abnormality noted. Electronically Signed   By: Alcide Clever M.D.   On: 06/21/2023 02:32   DG Chest 2  View  Result Date: 06/14/2023 CLINICAL DATA:  72 year old male with history of shortness of breath and leg swelling. EXAM: CHEST - 2 VIEW COMPARISON:  Chest x-ray 09/30/2022. FINDINGS: Ill-defined opacity in the periphery of the left lung base obscuring the left hemidiaphragm and portions of the left heart border. Right lung is clear. Probable small left pleural effusion. No definite right pleural effusion. No pneumothorax. Cephalization of the pulmonary vasculature, without definite pulmonary edema. Heart size is mildly enlarged. Upper mediastinal contours are within normal limits. Atherosclerotic calcifications are noted in the thoracic aorta. Status post median sternotomy. Multiple old healed right-sided rib fractures are incidentally noted. IMPRESSION: 1. Poorly defined opacity in the periphery of the left lung base concerning for developing pneumonia. Probable small left pleural effusion also noted. Followup PA and lateral chest X-ray is recommended in 3-4 weeks following trial of antibiotic therapy to ensure resolution and exclude underlying malignancy. 2. Cardiomegaly with pulmonary venous congestion. 3. Aortic atherosclerosis. Electronically Signed   By: Trudie Reed M.D.   On: 06/14/2023 06:39   VAS [place] LOWER EXTREMITY VENOUS (DVT)  Result Date: 06/09/2023  Lower Venous DVT Study Patient Name:  MASANOBU COLAS  Date of Exam:   06/09/2023 Medical Rec #: [id number]        Accession #:    [id number] Date of Birth: [birth year]         Patient Gender: M Patient Age:   73 years Exam Location:  [facility] Procedure:      VAS [place] LOWER EXTREMITY VENOUS (DVT) Referring Phys: Gean Birchwood --------------------------------------------------------------------------------  Indications: Pain, and Swelling Left lower extremity. Other Indications:  CHF. COPD. SOB Comparison Study: No prior exam. Performing Technologist: Elita Quick RVT  Examination Guidelines: A complete evaluation includes B-mode  imaging, spectral Doppler, color Doppler, and power Doppler as needed of all accessible portions of each vessel. Bilateral testing is considered an integral part of a complete examination. Limited examinations for reoccurring indications may be performed as noted. The reflux portion of the exam is performed with the patient in reverse Trendelenburg.  +-----+---------------+---------+-----------+----------+--------------+ RIGHTCompressibilityPhasicitySpontaneityPropertiesThrombus Aging +-----+---------------+---------+-----------+----------+--------------+ CFV  Full           Yes      Yes                                 +-----+---------------+---------+-----------+----------+--------------+   +---------+---------------+---------+-----------+----------+--------------+ LEFT     CompressibilityPhasicitySpontaneityPropertiesThrombus Aging +---------+---------------+---------+-----------+----------+--------------+ CFV      Full           Yes      Yes                                 +---------+---------------+---------+-----------+----------+--------------+ SFJ      Full           Yes      Yes                                 +---------+---------------+---------+-----------+----------+--------------+ FV Prox  Full           Yes      Yes                                 +---------+---------------+---------+-----------+----------+--------------+ FV Mid   Full           Yes      Yes                                 +---------+---------------+---------+-----------+----------+--------------+ FV DistalFull           Yes      Yes                                 +---------+---------------+---------+-----------+----------+--------------+ PFV      Full           Yes      Yes                                 +---------+---------------+---------+-----------+----------+--------------+ POP      Full           Yes      Yes                                  +---------+---------------+---------+-----------+----------+--------------+ PTV      Full           Yes      Yes                                 +---------+---------------+---------+-----------+----------+--------------+ PERO     Full  Yes      Yes                                 +---------+---------------+---------+-----------+----------+--------------+ Gastroc  Full           Yes      Yes                                 +---------+---------------+---------+-----------+----------+--------------+ GSV      Full           Yes      Yes                                 +---------+---------------+---------+-----------+----------+--------------+ SSV      Full           Yes      Yes                                 +---------+---------------+---------+-----------+----------+--------------+     Summary: LEFT: - There is no evidence of deep vein thrombosis in the lower extremity, limited visualizationof the left peroneal vein.. - There is no evidence of superficial venous thrombosis.  *See table(s) above for measurements and observations. Electronically signed by Sherald Hess MD on 06/09/2023 at 12:23:08 PM.    Final     Assessment/Plan   Acute cough Afebrile  Left mid and lower lobe diminished breath sounds - will start on doxycyline as below.discussed SE - doxycycline (VIBRA-TABS) 100 MG tablet; Take 1 tablet (100 mg total) by mouth 2 (two) times daily for 7 days.  Dispense: 14 tablet; Refill: 0  Family/ staff Communication: Reviewed plan of care with patient verbalized understanding  Labs/tests ordered: None   Next Appointment: Return if symptoms worsen or fail to improve.   Caesar Bookman, NP

## 2023-07-03 DIAGNOSIS — N2581 Secondary hyperparathyroidism of renal origin: Secondary | ICD-10-CM | POA: Diagnosis not present

## 2023-07-03 DIAGNOSIS — Z992 Dependence on renal dialysis: Secondary | ICD-10-CM | POA: Diagnosis not present

## 2023-07-03 DIAGNOSIS — D631 Anemia in chronic kidney disease: Secondary | ICD-10-CM | POA: Diagnosis not present

## 2023-07-03 DIAGNOSIS — N186 End stage renal disease: Secondary | ICD-10-CM | POA: Diagnosis not present

## 2023-07-05 DIAGNOSIS — Z992 Dependence on renal dialysis: Secondary | ICD-10-CM | POA: Diagnosis not present

## 2023-07-05 DIAGNOSIS — D631 Anemia in chronic kidney disease: Secondary | ICD-10-CM | POA: Diagnosis not present

## 2023-07-05 DIAGNOSIS — N186 End stage renal disease: Secondary | ICD-10-CM | POA: Diagnosis not present

## 2023-07-05 DIAGNOSIS — N2581 Secondary hyperparathyroidism of renal origin: Secondary | ICD-10-CM | POA: Diagnosis not present

## 2023-07-06 ENCOUNTER — Telehealth: Payer: Self-pay

## 2023-07-06 DIAGNOSIS — I132 Hypertensive heart and chronic kidney disease with heart failure and with stage 5 chronic kidney disease, or end stage renal disease: Secondary | ICD-10-CM | POA: Diagnosis not present

## 2023-07-06 DIAGNOSIS — N186 End stage renal disease: Secondary | ICD-10-CM | POA: Diagnosis not present

## 2023-07-06 DIAGNOSIS — E1142 Type 2 diabetes mellitus with diabetic polyneuropathy: Secondary | ICD-10-CM | POA: Diagnosis not present

## 2023-07-06 DIAGNOSIS — E1122 Type 2 diabetes mellitus with diabetic chronic kidney disease: Secondary | ICD-10-CM | POA: Diagnosis not present

## 2023-07-06 DIAGNOSIS — D631 Anemia in chronic kidney disease: Secondary | ICD-10-CM | POA: Diagnosis not present

## 2023-07-06 DIAGNOSIS — I5042 Chronic combined systolic (congestive) and diastolic (congestive) heart failure: Secondary | ICD-10-CM | POA: Diagnosis not present

## 2023-07-06 NOTE — Telephone Encounter (Signed)
Discussed response with patient and he emphasized that he had a chest xray on 06/21/23 and asked what else do you recommend?  Please forward response to Isidoro Donning, CMA as I am out of office until next Tuesday

## 2023-07-06 NOTE — Telephone Encounter (Signed)
Incoming call received from [facility] with [facility] requesting verbal orders for Medi/Honey Aqua-cell Unaboot once weekly for treatment of wounds bilateral on the medial area of ankles.   Per [employer] standing order, verbal order given. Message will be sent to patient's provider as a FYI.

## 2023-07-06 NOTE — Telephone Encounter (Signed)
Patient called and asked for an additional round of antibiotics. Patient states he took the last dose of doxycycline today, however he is still with a productive cough and nasal drainage. Patient stated that yesterday at dialysis he had to get a new mask due to small amount of blood in nasal discharge. Patient states he also has blisters on his legs and another round of doxycycline may help with that as well   Please advise

## 2023-07-06 NOTE — Telephone Encounter (Signed)
Recommend chest X-ray prior to prescribing another round of antibiotics due to persist cough.

## 2023-07-07 ENCOUNTER — Other Ambulatory Visit: Payer: Self-pay | Admitting: Internal Medicine

## 2023-07-07 DIAGNOSIS — D631 Anemia in chronic kidney disease: Secondary | ICD-10-CM | POA: Diagnosis not present

## 2023-07-07 DIAGNOSIS — N2581 Secondary hyperparathyroidism of renal origin: Secondary | ICD-10-CM | POA: Diagnosis not present

## 2023-07-07 DIAGNOSIS — N186 End stage renal disease: Secondary | ICD-10-CM | POA: Diagnosis not present

## 2023-07-07 DIAGNOSIS — Z992 Dependence on renal dialysis: Secondary | ICD-10-CM | POA: Diagnosis not present

## 2023-07-07 NOTE — Telephone Encounter (Signed)
Will need to schedu yeahle a follow up appointment to reevaluate persistent cough,could be also related to congestive heart failure.

## 2023-07-07 NOTE — Telephone Encounter (Signed)
I called patient and he declined the follow up appointment that dinah requested

## 2023-07-10 ENCOUNTER — Other Ambulatory Visit: Payer: Self-pay | Admitting: *Deleted

## 2023-07-10 ENCOUNTER — Other Ambulatory Visit (HOSPITAL_COMMUNITY): Payer: Self-pay

## 2023-07-10 DIAGNOSIS — N2581 Secondary hyperparathyroidism of renal origin: Secondary | ICD-10-CM | POA: Diagnosis not present

## 2023-07-10 DIAGNOSIS — D631 Anemia in chronic kidney disease: Secondary | ICD-10-CM | POA: Diagnosis not present

## 2023-07-10 DIAGNOSIS — N186 End stage renal disease: Secondary | ICD-10-CM | POA: Diagnosis not present

## 2023-07-10 DIAGNOSIS — Z992 Dependence on renal dialysis: Secondary | ICD-10-CM | POA: Diagnosis not present

## 2023-07-10 NOTE — Telephone Encounter (Signed)
Patient requested refill.  Epic LR: 06/05/2023 Contract Date: 09/07/2022  Pended Rx and sent to Dr. Jacquenette Shone for approval.

## 2023-07-11 DIAGNOSIS — E1142 Type 2 diabetes mellitus with diabetic polyneuropathy: Secondary | ICD-10-CM | POA: Diagnosis not present

## 2023-07-11 DIAGNOSIS — D631 Anemia in chronic kidney disease: Secondary | ICD-10-CM | POA: Diagnosis not present

## 2023-07-11 DIAGNOSIS — I5042 Chronic combined systolic (congestive) and diastolic (congestive) heart failure: Secondary | ICD-10-CM | POA: Diagnosis not present

## 2023-07-11 DIAGNOSIS — I132 Hypertensive heart and chronic kidney disease with heart failure and with stage 5 chronic kidney disease, or end stage renal disease: Secondary | ICD-10-CM | POA: Diagnosis not present

## 2023-07-11 DIAGNOSIS — N186 End stage renal disease: Secondary | ICD-10-CM | POA: Diagnosis not present

## 2023-07-11 DIAGNOSIS — E1122 Type 2 diabetes mellitus with diabetic chronic kidney disease: Secondary | ICD-10-CM | POA: Diagnosis not present

## 2023-07-11 MED ORDER — OXYCODONE-ACETAMINOPHEN 5-325 MG PO TABS: 1.0000 | ORAL_TABLET | Freq: Three times a day (TID) | ORAL | 0 refills | Status: DC | PRN

## 2023-07-11 NOTE — Telephone Encounter (Signed)
Patient called back on 06/14/23 and given Dr. Fabio Bering message.

## 2023-07-11 NOTE — Telephone Encounter (Signed)
Will decreased norco to 3 tab daily due to ESRD  Refill send to pharmacy

## 2023-07-11 NOTE — Telephone Encounter (Signed)
Spoke with Patient this morning to let him know that [name], MD decreased norco to 3 tab daily due to ESRD. Patient was concern about the change of quantity being change from 120 to 90 and I offer Jacob [name] appointment to see [name], MD but he refused but will speak to [name], MD about this change at his next appointment.  Message sent to [name], MD

## 2023-07-12 DIAGNOSIS — D631 Anemia in chronic kidney disease: Secondary | ICD-10-CM | POA: Diagnosis not present

## 2023-07-12 DIAGNOSIS — N186 End stage renal disease: Secondary | ICD-10-CM | POA: Diagnosis not present

## 2023-07-12 DIAGNOSIS — N2581 Secondary hyperparathyroidism of renal origin: Secondary | ICD-10-CM | POA: Diagnosis not present

## 2023-07-12 DIAGNOSIS — Z992 Dependence on renal dialysis: Secondary | ICD-10-CM | POA: Diagnosis not present

## 2023-07-13 ENCOUNTER — Other Ambulatory Visit: Payer: Self-pay

## 2023-07-13 ENCOUNTER — Ambulatory Visit (INDEPENDENT_AMBULATORY_CARE_PROVIDER_SITE_OTHER): Payer: 59 | Admitting: Podiatry

## 2023-07-13 DIAGNOSIS — E1142 Type 2 diabetes mellitus with diabetic polyneuropathy: Secondary | ICD-10-CM | POA: Diagnosis not present

## 2023-07-13 DIAGNOSIS — L84 Corns and callosities: Secondary | ICD-10-CM | POA: Diagnosis not present

## 2023-07-13 DIAGNOSIS — E119 Type 2 diabetes mellitus without complications: Secondary | ICD-10-CM

## 2023-07-13 DIAGNOSIS — E1122 Type 2 diabetes mellitus with diabetic chronic kidney disease: Secondary | ICD-10-CM | POA: Diagnosis not present

## 2023-07-13 DIAGNOSIS — I5042 Chronic combined systolic (congestive) and diastolic (congestive) heart failure: Secondary | ICD-10-CM | POA: Diagnosis not present

## 2023-07-13 DIAGNOSIS — D631 Anemia in chronic kidney disease: Secondary | ICD-10-CM | POA: Diagnosis not present

## 2023-07-13 DIAGNOSIS — I132 Hypertensive heart and chronic kidney disease with heart failure and with stage 5 chronic kidney disease, or end stage renal disease: Secondary | ICD-10-CM | POA: Diagnosis not present

## 2023-07-13 DIAGNOSIS — N186 End stage renal disease: Secondary | ICD-10-CM | POA: Diagnosis not present

## 2023-07-13 MED ORDER — BD PEN NEEDLE MINI U/F 31G X 5 MM MISC
11 refills | Status: DC
Start: 2023-07-13 — End: 2024-01-24

## 2023-07-13 NOTE — Progress Notes (Signed)
Subjective:  [name], male    DOB: 05/24/1951,  MRN: [id number]   72 y.o. male presents for follow-up of prior intervention on right foot.  Had prior fourth MPJ resection approximately 1 year ago.  Wound is fully healed.  Does have some callus tissue on the area.  Says nails not needing trimming today.   Past Medical History:  Diagnosis Date   Acute respiratory failure (HCC) 03/01/2018   Anemia    Arthritis    "all over; mostly knees and back" (02/28/2018)   Chronic combined systolic and diastolic CHF (congestive heart failure) (HCC)    Chronic lower back pain    stenosis   Community acquired pneumonia 09/06/2013   COPD (chronic obstructive pulmonary disease) (HCC)    Coronary atherosclerosis of native coronary artery    a. 02/2003 s/p CABG x 2 (VG->RI, VG->RPDA; b. 11/2019 PCI: LM nl, LAD 90d, D3 50, RI 100, LCX 100p, OM3 100 - fills via L->L collats from D2/dLAD, RCA 100p, VG->RPDA ok, VG->RI 95 (3.5x48 Synergy XD DES).   Drug abuse (HCC)    hx; tested for cocaine as recently as 2/08. says he is not using drugs now - avoided defib. for this reason    ESRD (end stage renal disease) (HCC)    [facility] M-W-F- [facility]   Fall at home 10/2020   GERD (gastroesophageal reflux disease)    takes OTC meds as needed   GI bleeding    a. 11/2019 EGD: angiodysplastic lesions w/ bleeding s/p argon plasma/clipping/epi inj. Multiple admissions for the same.   Glaucoma    uses eye drops daily   Hepatitis B 1968   "tx'd w/isolation; caught it from toilet stools in gym"   History of blood transfusion 03/01/2019   History of colon polyps    benign   History of gout    takes Allopurinol daily as well as Colchicine-if needed (02/28/2018)   History of kidney stones    HTN (hypertension)    takes Coreg,Imdur.and Apresoline daily   Human immunodeficiency virus (HIV) disease (HCC) dx'd 1995   on Biktarvy as of 12/2020.     Hyperlipidemia    Ischemic cardiomyopathy    a. 01/2019 Echo: EF  40-45%, diffuse HK, mild basal septal hypertrophy. Diast dysfxn. Nl RV size/fxn. Sev dil LA. Triv MR/TR/PR.   Muscle spasm    takes Zanaflex as needed   Myocardial infarction (HCC) ~ 2004/2005   Nocturia    Peripheral neuropathy    takes gabapentin daily   Pneumonia    "at least twice" (02/28/2018)   SDH (subdural hematoma) (HCC)    Syphilis, unspecified    Type II diabetes mellitus (HCC) 2004   Lantus daily.Average fasting blood sugar 125-199   Wears glasses    Wears partial dentures     Allergies  Allergen Reactions   Augmentin [Amoxicillin-Pot Clavulanate] Diarrhea and Other (See Comments)    Severe diarrhea   Morphine Sulfate Anaphylaxis    Can't mix with oxycodone    Mucinex Fast-Max Other (See Comments)    Intense sweating    Amphetamines Other (See Comments)    Unknown reaction    ROS: Negative except as per HPI above  Objective:  General: AAO x3, NAD  Dermatological: On the right foot at prior surgical site there is healed dorsal incision with no evidence of ulceration drainage erythema or edema.  Plantar to the prior fourth metatarsal head there is hyperkeratotic tissue without underlying ulceration.  No open wound  or drainage no evidence of infection  Vascular:  Dorsalis Pedis artery and Posterior Tibial artery pedal pulses are palpable bilateral.  Capillary fill time < 3 sec to all digits.   Neruologic: Grossly diminished via light touch bilateral. Protective threshold diminished bilaterally  Musculoskeletal: Status post right fourth metatarsal head resection  Gait: Unassisted, Nonantalgic.   No images are attached to the encounter.  Radiographs:  Deferred Assessment:   1. Pre-ulcerative calluses   2. DM type 2 with diabetic peripheral neuropathy (HCC)       Plan:  Patient was evaluated and treated and all questions answered.  # Tinea pedis of the left dorsal forefoot -Resolved after ketoconazole use additional PRN.  # History of ulceration and  osteomyelitis in the right fourth metatarsal phalangeal joint.  Status post fourth MPJ resection right foot on July 20, 2022 now fully healed with no evidence of residual infection or recurrence of ulceration dorsal or plantar to the prior fourth metatarsal phalangeal joint  # Hyperkeratotic lesion representing preulcerative callus on the plantar aspect of the right forefoot All symptomatic hyperkeratoses x1 were safely debrided with a sterile #15 blade to patient's level of comfort without incident. We discussed preventative and palliative care of these lesions including supportive and accommodative shoegear, padding, prefabricated and custom molded accommodative orthoses, use of a pumice stone and lotions/creams daily.           Corinna Gab, DPM [facility] / [facility]

## 2023-07-14 DIAGNOSIS — N2581 Secondary hyperparathyroidism of renal origin: Secondary | ICD-10-CM | POA: Diagnosis not present

## 2023-07-14 DIAGNOSIS — Z992 Dependence on renal dialysis: Secondary | ICD-10-CM | POA: Diagnosis not present

## 2023-07-14 DIAGNOSIS — D631 Anemia in chronic kidney disease: Secondary | ICD-10-CM | POA: Diagnosis not present

## 2023-07-14 DIAGNOSIS — N186 End stage renal disease: Secondary | ICD-10-CM | POA: Diagnosis not present

## 2023-07-17 DIAGNOSIS — N2581 Secondary hyperparathyroidism of renal origin: Secondary | ICD-10-CM | POA: Diagnosis not present

## 2023-07-17 DIAGNOSIS — N186 End stage renal disease: Secondary | ICD-10-CM | POA: Diagnosis not present

## 2023-07-17 DIAGNOSIS — Z992 Dependence on renal dialysis: Secondary | ICD-10-CM | POA: Diagnosis not present

## 2023-07-17 DIAGNOSIS — D631 Anemia in chronic kidney disease: Secondary | ICD-10-CM | POA: Diagnosis not present

## 2023-07-18 DIAGNOSIS — E1142 Type 2 diabetes mellitus with diabetic polyneuropathy: Secondary | ICD-10-CM | POA: Diagnosis not present

## 2023-07-18 DIAGNOSIS — I132 Hypertensive heart and chronic kidney disease with heart failure and with stage 5 chronic kidney disease, or end stage renal disease: Secondary | ICD-10-CM | POA: Diagnosis not present

## 2023-07-18 DIAGNOSIS — E1122 Type 2 diabetes mellitus with diabetic chronic kidney disease: Secondary | ICD-10-CM | POA: Diagnosis not present

## 2023-07-18 DIAGNOSIS — I5042 Chronic combined systolic (congestive) and diastolic (congestive) heart failure: Secondary | ICD-10-CM | POA: Diagnosis not present

## 2023-07-18 DIAGNOSIS — D631 Anemia in chronic kidney disease: Secondary | ICD-10-CM | POA: Diagnosis not present

## 2023-07-18 DIAGNOSIS — N186 End stage renal disease: Secondary | ICD-10-CM | POA: Diagnosis not present

## 2023-07-19 DIAGNOSIS — Z992 Dependence on renal dialysis: Secondary | ICD-10-CM | POA: Diagnosis not present

## 2023-07-19 DIAGNOSIS — D631 Anemia in chronic kidney disease: Secondary | ICD-10-CM | POA: Diagnosis not present

## 2023-07-19 DIAGNOSIS — N186 End stage renal disease: Secondary | ICD-10-CM | POA: Diagnosis not present

## 2023-07-19 DIAGNOSIS — N2581 Secondary hyperparathyroidism of renal origin: Secondary | ICD-10-CM | POA: Diagnosis not present

## 2023-07-20 ENCOUNTER — Other Ambulatory Visit (HOSPITAL_COMMUNITY): Payer: Self-pay

## 2023-07-20 ENCOUNTER — Other Ambulatory Visit: Payer: Self-pay

## 2023-07-20 MED ORDER — SANDOSTATIN LAR DEPOT 10 MG IM KIT
10.0000 mg | PACK | INTRAMUSCULAR | 1 refills | Status: DC
  Filled 2023-07-20: qty 1, 28d supply, fill #0
  Filled 2023-08-11: qty 1, 28d supply, fill #1

## 2023-07-20 NOTE — Telephone Encounter (Signed)
[facility] is calling to have Octreotide sent in. PT has scheduled an OV for 10/29 and should be sent to [facility]

## 2023-07-20 NOTE — Addendum Note (Signed)
Addended by: Richardson Chiquito on: 07/20/2023 03:57 PM   Modules accepted: Orders

## 2023-07-20 NOTE — Telephone Encounter (Signed)
Rx for octreotide has been sent to [facility] as requested.

## 2023-07-21 ENCOUNTER — Other Ambulatory Visit: Payer: Self-pay

## 2023-07-21 ENCOUNTER — Other Ambulatory Visit (HOSPITAL_COMMUNITY): Payer: Self-pay

## 2023-07-21 DIAGNOSIS — N2581 Secondary hyperparathyroidism of renal origin: Secondary | ICD-10-CM | POA: Diagnosis not present

## 2023-07-21 DIAGNOSIS — Z992 Dependence on renal dialysis: Secondary | ICD-10-CM | POA: Diagnosis not present

## 2023-07-21 DIAGNOSIS — N186 End stage renal disease: Secondary | ICD-10-CM | POA: Diagnosis not present

## 2023-07-21 DIAGNOSIS — D631 Anemia in chronic kidney disease: Secondary | ICD-10-CM | POA: Diagnosis not present

## 2023-07-24 ENCOUNTER — Other Ambulatory Visit: Payer: Self-pay | Admitting: Cardiovascular Disease

## 2023-07-24 DIAGNOSIS — D631 Anemia in chronic kidney disease: Secondary | ICD-10-CM | POA: Diagnosis not present

## 2023-07-24 DIAGNOSIS — N2581 Secondary hyperparathyroidism of renal origin: Secondary | ICD-10-CM | POA: Diagnosis not present

## 2023-07-24 DIAGNOSIS — Z992 Dependence on renal dialysis: Secondary | ICD-10-CM | POA: Diagnosis not present

## 2023-07-24 DIAGNOSIS — N186 End stage renal disease: Secondary | ICD-10-CM | POA: Diagnosis not present

## 2023-07-24 DIAGNOSIS — I129 Hypertensive chronic kidney disease with stage 1 through stage 4 chronic kidney disease, or unspecified chronic kidney disease: Secondary | ICD-10-CM | POA: Diagnosis not present

## 2023-07-24 NOTE — Progress Notes (Signed)
Seen by casting department

## 2023-07-25 DIAGNOSIS — E1122 Type 2 diabetes mellitus with diabetic chronic kidney disease: Secondary | ICD-10-CM | POA: Diagnosis not present

## 2023-07-25 DIAGNOSIS — D631 Anemia in chronic kidney disease: Secondary | ICD-10-CM | POA: Diagnosis not present

## 2023-07-25 DIAGNOSIS — I5042 Chronic combined systolic (congestive) and diastolic (congestive) heart failure: Secondary | ICD-10-CM | POA: Diagnosis not present

## 2023-07-25 DIAGNOSIS — E1142 Type 2 diabetes mellitus with diabetic polyneuropathy: Secondary | ICD-10-CM | POA: Diagnosis not present

## 2023-07-25 DIAGNOSIS — I132 Hypertensive heart and chronic kidney disease with heart failure and with stage 5 chronic kidney disease, or end stage renal disease: Secondary | ICD-10-CM | POA: Diagnosis not present

## 2023-07-25 DIAGNOSIS — N186 End stage renal disease: Secondary | ICD-10-CM | POA: Diagnosis not present

## 2023-07-26 ENCOUNTER — Other Ambulatory Visit: Payer: Self-pay | Admitting: Cardiovascular Disease

## 2023-07-26 DIAGNOSIS — E1122 Type 2 diabetes mellitus with diabetic chronic kidney disease: Secondary | ICD-10-CM | POA: Diagnosis not present

## 2023-07-26 DIAGNOSIS — N186 End stage renal disease: Secondary | ICD-10-CM | POA: Diagnosis not present

## 2023-07-26 DIAGNOSIS — N2581 Secondary hyperparathyroidism of renal origin: Secondary | ICD-10-CM | POA: Diagnosis not present

## 2023-07-26 DIAGNOSIS — D631 Anemia in chronic kidney disease: Secondary | ICD-10-CM | POA: Diagnosis not present

## 2023-07-26 DIAGNOSIS — Z992 Dependence on renal dialysis: Secondary | ICD-10-CM | POA: Diagnosis not present

## 2023-07-27 ENCOUNTER — Ambulatory Visit (HOSPITAL_COMMUNITY)
Admission: RE | Admit: 2023-07-27 | Discharge: 2023-07-27 | Disposition: A | Discharge status: Home / Self Care | Payer: 59 | Source: Ambulatory Visit | Attending: Gastroenterology | Admitting: Gastroenterology

## 2023-07-27 DIAGNOSIS — Q2730 Arteriovenous malformation, site unspecified: Secondary | ICD-10-CM | POA: Insufficient documentation

## 2023-07-27 DIAGNOSIS — D649 Anemia, unspecified: Secondary | ICD-10-CM | POA: Insufficient documentation

## 2023-07-27 MED ORDER — OCTREOTIDE ACETATE 10 MG IM KIT
10.0000 mg | PACK | INTRAMUSCULAR | Status: DC
  Administered 2023-07-27: 10 mg via INTRAMUSCULAR
  Filled 2023-07-27: qty 1

## 2023-07-28 ENCOUNTER — Other Ambulatory Visit (HOSPITAL_COMMUNITY): Payer: Self-pay

## 2023-07-28 ENCOUNTER — Telehealth: Payer: 59

## 2023-07-28 DIAGNOSIS — N186 End stage renal disease: Secondary | ICD-10-CM | POA: Diagnosis not present

## 2023-07-28 DIAGNOSIS — Z992 Dependence on renal dialysis: Secondary | ICD-10-CM | POA: Diagnosis not present

## 2023-07-28 DIAGNOSIS — N2581 Secondary hyperparathyroidism of renal origin: Secondary | ICD-10-CM | POA: Diagnosis not present

## 2023-07-28 DIAGNOSIS — E1122 Type 2 diabetes mellitus with diabetic chronic kidney disease: Secondary | ICD-10-CM | POA: Diagnosis not present

## 2023-07-28 DIAGNOSIS — D631 Anemia in chronic kidney disease: Secondary | ICD-10-CM | POA: Diagnosis not present

## 2023-07-28 NOTE — Telephone Encounter (Signed)
Monique PT from [facility] called and states that patient needs verbal orders for 1 time week for 3 more weeks. Verbal orders given.

## 2023-07-31 DIAGNOSIS — E1122 Type 2 diabetes mellitus with diabetic chronic kidney disease: Secondary | ICD-10-CM | POA: Diagnosis not present

## 2023-07-31 DIAGNOSIS — Z992 Dependence on renal dialysis: Secondary | ICD-10-CM | POA: Diagnosis not present

## 2023-07-31 DIAGNOSIS — N2581 Secondary hyperparathyroidism of renal origin: Secondary | ICD-10-CM | POA: Diagnosis not present

## 2023-07-31 DIAGNOSIS — D631 Anemia in chronic kidney disease: Secondary | ICD-10-CM | POA: Diagnosis not present

## 2023-07-31 DIAGNOSIS — N186 End stage renal disease: Secondary | ICD-10-CM | POA: Diagnosis not present

## 2023-08-01 ENCOUNTER — Ambulatory Visit: Payer: Self-pay

## 2023-08-01 DIAGNOSIS — E1122 Type 2 diabetes mellitus with diabetic chronic kidney disease: Secondary | ICD-10-CM | POA: Diagnosis not present

## 2023-08-01 DIAGNOSIS — I132 Hypertensive heart and chronic kidney disease with heart failure and with stage 5 chronic kidney disease, or end stage renal disease: Secondary | ICD-10-CM | POA: Diagnosis not present

## 2023-08-01 DIAGNOSIS — N186 End stage renal disease: Secondary | ICD-10-CM | POA: Diagnosis not present

## 2023-08-01 DIAGNOSIS — D631 Anemia in chronic kidney disease: Secondary | ICD-10-CM | POA: Diagnosis not present

## 2023-08-01 DIAGNOSIS — E1142 Type 2 diabetes mellitus with diabetic polyneuropathy: Secondary | ICD-10-CM | POA: Diagnosis not present

## 2023-08-01 DIAGNOSIS — I5042 Chronic combined systolic (congestive) and diastolic (congestive) heart failure: Secondary | ICD-10-CM | POA: Diagnosis not present

## 2023-08-01 NOTE — Patient Outreach (Signed)
Care Coordination   Follow Up Visit Note   08/01/2023 Name: Jacob [name] MRN: [id number] DOB: 1951-06-02  Jacob [name] is a 72 y.o. year old male who sees [name], MD for primary care. I spoke with  David Stall by phone today.  What matters to the patients health and wellness today?  Patient would like to discuss his change in pain meds with his PCP at next scheduled visit. He would like to work on lowering his A1c and have wound healing without complications.     Goals Addressed             This Visit's Progress    To have ankle ulcerations heal without complications       Care Coordination Interventions: Evaluation of current treatment plan related to bilateral ulcerations to inner ankles and patient's adherence to plan as established by provider Determined patient is currently receiving wound care per [facility] for new ulcerations to both inner ankles, patient is unsure how these ulcerations developed Educated patient about the importance of managing his diabetes, keeping daily blood sugars within range to help aid in wound healing  Instructed patient to keep his PCP well informed of new symptoms or concerns      COMPLETED: To have nasal congestion evaluated       Care Coordination Interventions: Evaluation of current treatment plan related to nasal congestion  and patient's adherence to plan as established by provider Discussed with patient he completed an in person visit with his PCP provider for evaluation of persistent cough Review of patient status, including review of consultant's reports, relevant laboratory and other test results, and medications completed and discussed importance of medication adherence Determined patient continues to experience a mild cough but reports overall symptoms have improved as evidence by he is using his nebulizer less frequently  Instructed patient to notify his doctor of new symptoms or concerns promptly       To improve diabetes and lower A1c       Care Coordination Interventions: Provided education to patient about basic DM disease process Reviewed medications with patient and discussed importance of medication adherence Counseled on importance of regular laboratory monitoring as prescribed Provided patient with written educational materials related to hypo and hyperglycemia and importance of correct treatment Advised patient, providing education and rationale, to check cbg daily before meals and record, calling PCP for findings outside established parameters Educated patient about daily glycemic control, FBS 80-130, <180 after meals  Review of patient status, including review of consultants reports, relevant laboratory and other test results, and medications completed Counseled on Diabetic diet, my plate method, 010 minutes of moderate intensity exercise/week Mailed printed educational materials related to Diabetes Management  Sent in basket message to PCP Dr. Jacquenette Shone regarding patient's knowledge deficit related to diabetes and new skin breakdown  Routed note to PCP with request to recheck A1c at next scheduled follow up  Assessed patient's understanding of A1c goal: <7% Lab Results  Component Value Date   HGBA1C 6.8 (H) 06/15/2023       COMPLETED: To receive nebulizer from DME supplier       Care Coordination Interventions: Evaluation of current treatment plan related to shortness of breath  and patient's adherence to plan as established by provider Determined patient received and is using his nebulizer as directed for shortness of breath, cough and or wheezing     Interventions Today    Flowsheet Row Most Recent Value  Chronic Disease  Chronic disease during today's visit Other, Diabetes  [cough,  leg ulcerations]  General Interventions   General Interventions Discussed/Reviewed General Interventions Discussed, General Interventions Reviewed, Doctor Visits, Labs, Communication with   Doctor Visits Discussed/Reviewed Doctor Visits Discussed, Doctor Visits Reviewed, PCP  Communication with PCP/Specialists  [Dr. Veludandi]  Exercise Interventions   Exercise Discussed/Reviewed Exercise Reviewed, Exercise Discussed, Physical Activity  Physical Activity Discussed/Reviewed Physical Activity Discussed, Physical Activity Reviewed, Home Exercise Program (HEP)  Education Interventions   Education Provided Provided Education, Provided Printed Education  Provided Verbal Education On When to see the doctor, Medication, Exercise, Nutrition, Labs, Foot Care, Blood Sugar Monitoring  Labs Reviewed Hgb A1c  Nutrition Interventions   Nutrition Discussed/Reviewed Nutrition Discussed, Nutrition Reviewed, Carbohydrate meal planning, Portion sizes  Pharmacy Interventions   Pharmacy Dicussed/Reviewed Pharmacy Topics Discussed, Pharmacy Topics Reviewed, Medications and their functions          SDOH assessments and interventions completed:  No     Care Coordination Interventions:  Yes, provided   Follow up plan: Follow up call scheduled for 09/26/23 @11 :30 AM    Encounter Outcome:  Patient Visit Completed

## 2023-08-01 NOTE — Patient Instructions (Signed)
Visit Information  Thank you for taking time to visit with me today. Please don't hesitate to contact me if I can be of assistance to you.   Following are the goals we discussed today:   Goals Addressed             This Visit's Progress    To have ankle ulcerations heal without complications       Care Coordination Interventions: Evaluation of current treatment plan related to bilateral ulcerations to inner ankles and patient's adherence to plan as established by provider Determined patient is currently receiving wound care per [name] for new ulcerations to both inner ankles, patient is unsure how these ulcerations developed Educated patient about the importance of managing his diabetes, keeping daily blood sugars within range to help aid in wound healing  Instructed patient to keep his PCP well informed of new symptoms or concerns      COMPLETED: To have nasal congestion evaluated       Care Coordination Interventions: Evaluation of current treatment plan related to nasal congestion  and patient's adherence to plan as established by provider Discussed with patient he completed an in person visit with his PCP provider for evaluation of persistent cough Review of patient status, including review of consultant's reports, relevant laboratory and other test results, and medications completed and discussed importance of medication adherence Determined patient continues to experience a mild cough but reports overall symptoms have improved as evidence by he is using his nebulizer less frequently  Instructed patient to notify his doctor of new symptoms or concerns promptly      To improve diabetes and lower A1c       Care Coordination Interventions: Provided education to patient about basic DM disease process Reviewed medications with patient and discussed importance of medication adherence Counseled on importance of regular laboratory monitoring as prescribed Provided patient  with written educational materials related to hypo and hyperglycemia and importance of correct treatment Advised patient, providing education and rationale, to check cbg daily before meals and record, calling PCP for findings outside established parameters Educated patient about daily glycemic control, FBS 80-130, <180 after meals  Review of patient status, including review of consultants reports, relevant laboratory and other test results, and medications completed Counseled on Diabetic diet, my plate method, 782 minutes of moderate intensity exercise/week Mailed printed educational materials related to Diabetes Management  Sent in basket message to PCP Dr. Jacquenette Shone regarding patient's knowledge deficit related to diabetes and new skin breakdown  Routed note to PCP with request to recheck A1c at next scheduled follow up  Assessed patient's understanding of A1c goal: <7% Lab Results  Component Value Date   HGBA1C 6.8 (H) 06/15/2023           COMPLETED: To receive nebulizer from DME supplier       Care Coordination Interventions: Evaluation of current treatment plan related to shortness of breath  and patient's adherence to plan as established by provider Determined patient received and is using his nebulizer as directed for shortness of breath, cough and or wheezing         Our next appointment is by telephone on 09/26/23 at 11:30 AM  Please call the care guide team at [phone] if you need to cancel or reschedule your appointment.   If you are experiencing a Mental Health or Behavioral Health Crisis or need someone to talk to, please call 1-800-273-TALK (toll free, 24 hour hotline)  The patient verbalized understanding of  instructions, educational materials, and care plan provided today and DECLINED offer to receive copy of patient instructions, educational materials, and care plan.   Delsa Sale RN BSN CCM [place]  [facility], [facility] Nurse  Care Coordinator  Direct Dial: [phone] Website: [email or web address]

## 2023-08-02 ENCOUNTER — Telehealth: Payer: Self-pay

## 2023-08-02 DIAGNOSIS — E1122 Type 2 diabetes mellitus with diabetic chronic kidney disease: Secondary | ICD-10-CM | POA: Diagnosis not present

## 2023-08-02 DIAGNOSIS — Z992 Dependence on renal dialysis: Secondary | ICD-10-CM | POA: Diagnosis not present

## 2023-08-02 DIAGNOSIS — D631 Anemia in chronic kidney disease: Secondary | ICD-10-CM | POA: Diagnosis not present

## 2023-08-02 DIAGNOSIS — N2581 Secondary hyperparathyroidism of renal origin: Secondary | ICD-10-CM | POA: Diagnosis not present

## 2023-08-02 DIAGNOSIS — N186 End stage renal disease: Secondary | ICD-10-CM | POA: Diagnosis not present

## 2023-08-02 NOTE — Telephone Encounter (Signed)
Patient called asking that I send a message to Hazle Nordmann, NP to confirm the protocol that allows him to request a refill on Oxycodone once he is down to 10 pills. Patient states this was a conversation between him and Amy. Patient emphasized that he prefers that ONLY Amy calls him back.  I double checked that patient was referring to Amy as he is assigned to [name], MD and was previously assigned to Dr.Miller and he confirmed that Hazle Nordmann, NP should be the recipient of this message

## 2023-08-02 NOTE — Telephone Encounter (Signed)
Spoke with and he states "None of the providers stick to their word and he remembers clearly that Amy said his rx could be refilled once he gets down to 10 pills." Patient states he will follow-up with [name], MD in November and no need to forward her this message

## 2023-08-02 NOTE — Telephone Encounter (Signed)
I have never seen this patient. I recommend he discuss pain medication with Dr. Jacquenette Shone.

## 2023-08-03 DIAGNOSIS — N186 End stage renal disease: Secondary | ICD-10-CM | POA: Diagnosis not present

## 2023-08-03 DIAGNOSIS — E1122 Type 2 diabetes mellitus with diabetic chronic kidney disease: Secondary | ICD-10-CM | POA: Diagnosis not present

## 2023-08-03 DIAGNOSIS — I5042 Chronic combined systolic (congestive) and diastolic (congestive) heart failure: Secondary | ICD-10-CM | POA: Diagnosis not present

## 2023-08-03 DIAGNOSIS — E1142 Type 2 diabetes mellitus with diabetic polyneuropathy: Secondary | ICD-10-CM | POA: Diagnosis not present

## 2023-08-03 DIAGNOSIS — I132 Hypertensive heart and chronic kidney disease with heart failure and with stage 5 chronic kidney disease, or end stage renal disease: Secondary | ICD-10-CM | POA: Diagnosis not present

## 2023-08-03 DIAGNOSIS — D631 Anemia in chronic kidney disease: Secondary | ICD-10-CM | POA: Diagnosis not present

## 2023-08-04 DIAGNOSIS — N186 End stage renal disease: Secondary | ICD-10-CM | POA: Diagnosis not present

## 2023-08-04 DIAGNOSIS — Z992 Dependence on renal dialysis: Secondary | ICD-10-CM | POA: Diagnosis not present

## 2023-08-04 DIAGNOSIS — N2581 Secondary hyperparathyroidism of renal origin: Secondary | ICD-10-CM | POA: Diagnosis not present

## 2023-08-04 DIAGNOSIS — D631 Anemia in chronic kidney disease: Secondary | ICD-10-CM | POA: Diagnosis not present

## 2023-08-04 DIAGNOSIS — E1122 Type 2 diabetes mellitus with diabetic chronic kidney disease: Secondary | ICD-10-CM | POA: Diagnosis not present

## 2023-08-07 DIAGNOSIS — Z992 Dependence on renal dialysis: Secondary | ICD-10-CM | POA: Diagnosis not present

## 2023-08-07 DIAGNOSIS — E1122 Type 2 diabetes mellitus with diabetic chronic kidney disease: Secondary | ICD-10-CM | POA: Diagnosis not present

## 2023-08-07 DIAGNOSIS — D631 Anemia in chronic kidney disease: Secondary | ICD-10-CM | POA: Diagnosis not present

## 2023-08-07 DIAGNOSIS — N2581 Secondary hyperparathyroidism of renal origin: Secondary | ICD-10-CM | POA: Diagnosis not present

## 2023-08-07 DIAGNOSIS — N186 End stage renal disease: Secondary | ICD-10-CM | POA: Diagnosis not present

## 2023-08-08 ENCOUNTER — Ambulatory Visit: Payer: 59

## 2023-08-08 DIAGNOSIS — I132 Hypertensive heart and chronic kidney disease with heart failure and with stage 5 chronic kidney disease, or end stage renal disease: Secondary | ICD-10-CM | POA: Diagnosis not present

## 2023-08-08 DIAGNOSIS — E1142 Type 2 diabetes mellitus with diabetic polyneuropathy: Secondary | ICD-10-CM | POA: Diagnosis not present

## 2023-08-08 DIAGNOSIS — E1122 Type 2 diabetes mellitus with diabetic chronic kidney disease: Secondary | ICD-10-CM | POA: Diagnosis not present

## 2023-08-08 DIAGNOSIS — I5042 Chronic combined systolic (congestive) and diastolic (congestive) heart failure: Secondary | ICD-10-CM | POA: Diagnosis not present

## 2023-08-08 DIAGNOSIS — N186 End stage renal disease: Secondary | ICD-10-CM | POA: Diagnosis not present

## 2023-08-08 DIAGNOSIS — D631 Anemia in chronic kidney disease: Secondary | ICD-10-CM | POA: Diagnosis not present

## 2023-08-08 NOTE — Patient Instructions (Signed)
Visit Information  Thank you for taking time to visit with me today. Please don't hesitate to contact me if I can be of assistance to you.   Following are the goals we discussed today:   Goals Addressed             This Visit's Progress    To have ankle ulcerations heal without complications       Care Coordination Interventions: Received an inbound call from patient  Evaluation of current treatment plan related to bilateral ulcerations to inner ankles and patient's adherence to plan as established by provider Determined patient is concerned he will run out of pain medication due to having his new PCP reduce his dose Discussed patient is experiencing persistent pain from his leg ulcerations and would like for his PCP to consider changing his dose back to the original dose Sent in basket message to Dr. Jacquenette Shone to advise of patient's concerns and request Determined patient's leg ulcerations are healing per his home health nurse as evidence by the wounds are pink and getting smaller            Our next appointment is by telephone on 08/10/23 at 11:30 AM  Please call the care guide team at [phone] if you need to cancel or reschedule your appointment.   If you are experiencing a Mental Health or Behavioral Health Crisis or need someone to talk to, please call 1-800-273-TALK (toll free, 24 hour hotline)  The patient verbalized understanding of instructions, educational materials, and care plan provided today and DECLINED offer to receive copy of patient instructions, educational materials, and care plan.   Delsa Sale RN BSN CCM [place]  [facility], [facility] Nurse Care Coordinator  Direct Dial: [phone] Website: [email or web address]

## 2023-08-08 NOTE — Patient Outreach (Signed)
Care Coordination   Follow Up Visit Note   08/08/2023 Name: Jacob [name] MRN: [id number] DOB: 10/12/1951  Jacob [name] is a 72 y.o. year old male who sees [name], MD for primary care. I spoke with  Jacob [name] by phone today.  What matters to the patients health and wellness today?  Patient would like his PCP to increase the dosage of his pain medication.     Goals Addressed             This Visit's Progress    To have ankle ulcerations heal without complications       Care Coordination Interventions: Received an inbound call from patient  Evaluation of current treatment plan related to bilateral ulcerations to inner ankles and patient's adherence to plan as established by provider Determined patient is concerned he will run out of pain medication due to having his new PCP reduce his dose Discussed patient is experiencing persistent pain from his leg ulcerations and would like for his PCP to consider changing his dose back to the original dose Sent in basket message to Dr. Jacquenette Shone to advise of patient's concerns and request Determined patient's leg ulcerations are healing per his home health nurse as evidence by the wounds are pink and getting smaller     Interventions Today    Flowsheet Row Most Recent Value  Chronic Disease   Chronic disease during today's visit Other  [leg ulcerations]  General Interventions   General Interventions Discussed/Reviewed General Interventions Discussed, General Interventions Reviewed, Doctor Visits, Communication with  Doctor Visits Discussed/Reviewed Doctor Visits Discussed, Doctor Visits Reviewed, Specialist  Communication with PCP/Specialists  [Dr. Veludandi]  Education Interventions   Education Provided Provided Education  Provided Verbal Education On When to see the doctor, Medication  Pharmacy Interventions   Pharmacy Dicussed/Reviewed Pharmacy Topics Discussed, Pharmacy Topics Reviewed, Medications and their  functions          SDOH assessments and interventions completed:  No     Care Coordination Interventions:  Yes, provided   Follow up plan: Follow up call scheduled for 08/10/23 @11 :30 AM    Encounter Outcome:  Patient Visit Completed

## 2023-08-09 DIAGNOSIS — N2581 Secondary hyperparathyroidism of renal origin: Secondary | ICD-10-CM | POA: Diagnosis not present

## 2023-08-09 DIAGNOSIS — N186 End stage renal disease: Secondary | ICD-10-CM | POA: Diagnosis not present

## 2023-08-09 DIAGNOSIS — Z992 Dependence on renal dialysis: Secondary | ICD-10-CM | POA: Diagnosis not present

## 2023-08-09 DIAGNOSIS — E1122 Type 2 diabetes mellitus with diabetic chronic kidney disease: Secondary | ICD-10-CM | POA: Diagnosis not present

## 2023-08-09 DIAGNOSIS — D631 Anemia in chronic kidney disease: Secondary | ICD-10-CM | POA: Diagnosis not present

## 2023-08-10 ENCOUNTER — Ambulatory Visit: Payer: Self-pay

## 2023-08-10 ENCOUNTER — Other Ambulatory Visit: Payer: Self-pay

## 2023-08-10 DIAGNOSIS — I132 Hypertensive heart and chronic kidney disease with heart failure and with stage 5 chronic kidney disease, or end stage renal disease: Secondary | ICD-10-CM | POA: Diagnosis not present

## 2023-08-10 DIAGNOSIS — D631 Anemia in chronic kidney disease: Secondary | ICD-10-CM | POA: Diagnosis not present

## 2023-08-10 DIAGNOSIS — E1142 Type 2 diabetes mellitus with diabetic polyneuropathy: Secondary | ICD-10-CM | POA: Diagnosis not present

## 2023-08-10 DIAGNOSIS — N186 End stage renal disease: Secondary | ICD-10-CM | POA: Diagnosis not present

## 2023-08-10 DIAGNOSIS — E1122 Type 2 diabetes mellitus with diabetic chronic kidney disease: Secondary | ICD-10-CM | POA: Diagnosis not present

## 2023-08-10 DIAGNOSIS — I5042 Chronic combined systolic (congestive) and diastolic (congestive) heart failure: Secondary | ICD-10-CM | POA: Diagnosis not present

## 2023-08-10 MED ORDER — OXYCODONE-ACETAMINOPHEN 5-325 MG PO TABS: 1.0000 | ORAL_TABLET | Freq: Three times a day (TID) | ORAL | 0 refills | Status: DC | PRN

## 2023-08-10 NOTE — Telephone Encounter (Signed)
RX sent to wrong pharmacy, patient would like it sent to the local pharmacy- [facility] on [address]

## 2023-08-10 NOTE — Patient Instructions (Signed)
Visit Information  Thank you for taking time to visit with me today. Please don't hesitate to contact me if I can be of assistance to you.   Following are the goals we discussed today:   Goals Addressed             This Visit's Progress    To have ankle ulcerations heal without complications   On track    Care Coordination Interventions: Evaluation of current treatment plan related to bilateral ulcerations to inner ankles and patient's adherence to plan as established by provider Reviewed and discussed the following reply back from Dr. Jacquenette Shone regarding patient's request to increase his pain regimen as previously directed by Dr. Hyacinth Meeker I would not increase his pain medications. He needs to take tylenol 1000mg  three times daily for the pain.  ''History  of ulceration and osteomyelitis in the right fourth metatarsal phalangeal joint.  Status post fourth MPJ resection right foot on July 20, 2022 now fully healed with no evidence of residual infection or recurrence of ulceration dorsal or plantar to the prior fourth metatarsal phalangeal joint''.   Determined patient verbalizes understanding of the instructions provided to him today Discussed patient would like to transition his primary care to [facility] Sent securemail to Cindra Eves RN with [facility] requesting outreach to patient to discuss his transition to [facility] for primary care        Our next appointment is by telephone on 08/24/23 at 2:00 PM  Please call the care guide team at [phone] if you need to cancel or reschedule your appointment.   If you are experiencing a Mental Health or Behavioral Health Crisis or need someone to talk to, please call 1-800-273-TALK (toll free, 24 hour hotline)  The patient verbalized understanding of instructions, educational materials, and care plan provided today and DECLINED offer to receive copy of patient instructions, educational materials, and care plan.   :

## 2023-08-10 NOTE — Addendum Note (Signed)
Addended by: Maurice Small on: 08/10/2023 12:29 PM   Modules accepted: Orders

## 2023-08-10 NOTE — Patient Outreach (Signed)
Care Coordination   Follow Up Visit Note   08/10/2023 Name: WATT KRAATZ MRN: [id number] DOB: 06/23/1951  DAMARE URY is a 72 y.o. year old male who sees [name], MD for primary care. I spoke with  David Stall by phone today.  What matters to the patients health and wellness today?  Patient would like to transition his primary care over to [facility].     Goals Addressed             This Visit's Progress    To have ankle ulcerations heal without complications   On track    Care Coordination Interventions: Evaluation of current treatment plan related to bilateral ulcerations to inner ankles and patient's adherence to plan as established by provider Reviewed and discussed the following reply back from Dr. Jacquenette Shone regarding patient's request to increase his pain regimen as previously directed by Dr. Hyacinth Meeker I would not increase his pain medications. He needs to take tylenol 1000mg  three times daily for the pain.  ''History  of ulceration and osteomyelitis in the right fourth metatarsal phalangeal joint.  Status post fourth MPJ resection right foot on July 20, 2022 now fully healed with no evidence of residual infection or recurrence of ulceration dorsal or plantar to the prior fourth metatarsal phalangeal joint''.   Determined patient verbalizes understanding of the instructions provided to him today Discussed patient would like to transition his primary care to [facility] Sent securemail to Cindra Eves RN with [facility] requesting outreach to patient to discuss his transition to [facility] for primary care    Interventions Today    Flowsheet Row Most Recent Value  Chronic Disease   Chronic disease during today's visit Other  [chronic back pain]  General Interventions   General Interventions Discussed/Reviewed General Interventions Discussed, General Interventions Reviewed, Doctor Visits, Communication with  Doctor Visits Discussed/Reviewed Doctor  Visits Discussed, Doctor Visits Reviewed, PCP  Communication with PCP/Specialists, RN  [[facility],  Cindra Eves nurse coordinator]  Education Interventions   Education Provided Provided Education  Provided Verbal Education On Medication, When to see the doctor  Pharmacy Interventions   Pharmacy Dicussed/Reviewed Pharmacy Topics Discussed, Pharmacy Topics Reviewed, Medications and their functions, Medication Adherence          SDOH assessments and interventions completed:  No     Care Coordination Interventions:  Yes, provided   Follow up plan: Follow up call scheduled for 08/24/23 @2 :00 PM     Encounter Outcome:  Patient Visit Completed

## 2023-08-10 NOTE — Telephone Encounter (Signed)
Patient is requesting a refill of the following medications: Requested Prescriptions   Pending Prescriptions Disp Refills   oxyCODONE-acetaminophen (PERCOCET) 5-325 MG tablet 90 tablet 0    Sig: Take 1 tablet by mouth every 8 (eight) hours as needed for severe pain (pain score 7-10).    Date of last refill: 07/11/23  Refill amount: 90  Treatment agreement date: 09/07/22 (under Dr.Miller), notation made on pending appointment for 09/19/23, to update with [name], MD name listed as PCP>

## 2023-08-11 ENCOUNTER — Other Ambulatory Visit: Payer: Self-pay

## 2023-08-11 DIAGNOSIS — Z992 Dependence on renal dialysis: Secondary | ICD-10-CM | POA: Diagnosis not present

## 2023-08-11 DIAGNOSIS — N2581 Secondary hyperparathyroidism of renal origin: Secondary | ICD-10-CM | POA: Diagnosis not present

## 2023-08-11 DIAGNOSIS — E1122 Type 2 diabetes mellitus with diabetic chronic kidney disease: Secondary | ICD-10-CM | POA: Diagnosis not present

## 2023-08-11 DIAGNOSIS — D631 Anemia in chronic kidney disease: Secondary | ICD-10-CM | POA: Diagnosis not present

## 2023-08-11 DIAGNOSIS — N186 End stage renal disease: Secondary | ICD-10-CM | POA: Diagnosis not present

## 2023-08-14 DIAGNOSIS — N2581 Secondary hyperparathyroidism of renal origin: Secondary | ICD-10-CM | POA: Diagnosis not present

## 2023-08-14 DIAGNOSIS — Z992 Dependence on renal dialysis: Secondary | ICD-10-CM | POA: Diagnosis not present

## 2023-08-14 DIAGNOSIS — D631 Anemia in chronic kidney disease: Secondary | ICD-10-CM | POA: Diagnosis not present

## 2023-08-14 DIAGNOSIS — E1122 Type 2 diabetes mellitus with diabetic chronic kidney disease: Secondary | ICD-10-CM | POA: Diagnosis not present

## 2023-08-14 DIAGNOSIS — N186 End stage renal disease: Secondary | ICD-10-CM | POA: Diagnosis not present

## 2023-08-15 ENCOUNTER — Other Ambulatory Visit: Payer: Self-pay

## 2023-08-15 DIAGNOSIS — E1122 Type 2 diabetes mellitus with diabetic chronic kidney disease: Secondary | ICD-10-CM | POA: Diagnosis not present

## 2023-08-15 DIAGNOSIS — D631 Anemia in chronic kidney disease: Secondary | ICD-10-CM | POA: Diagnosis not present

## 2023-08-15 DIAGNOSIS — N186 End stage renal disease: Secondary | ICD-10-CM | POA: Diagnosis not present

## 2023-08-15 DIAGNOSIS — E1142 Type 2 diabetes mellitus with diabetic polyneuropathy: Secondary | ICD-10-CM | POA: Diagnosis not present

## 2023-08-15 DIAGNOSIS — I5042 Chronic combined systolic (congestive) and diastolic (congestive) heart failure: Secondary | ICD-10-CM | POA: Diagnosis not present

## 2023-08-15 DIAGNOSIS — I132 Hypertensive heart and chronic kidney disease with heart failure and with stage 5 chronic kidney disease, or end stage renal disease: Secondary | ICD-10-CM | POA: Diagnosis not present

## 2023-08-16 DIAGNOSIS — N2581 Secondary hyperparathyroidism of renal origin: Secondary | ICD-10-CM | POA: Diagnosis not present

## 2023-08-16 DIAGNOSIS — Z992 Dependence on renal dialysis: Secondary | ICD-10-CM | POA: Diagnosis not present

## 2023-08-16 DIAGNOSIS — D631 Anemia in chronic kidney disease: Secondary | ICD-10-CM | POA: Diagnosis not present

## 2023-08-16 DIAGNOSIS — N186 End stage renal disease: Secondary | ICD-10-CM | POA: Diagnosis not present

## 2023-08-16 DIAGNOSIS — E1122 Type 2 diabetes mellitus with diabetic chronic kidney disease: Secondary | ICD-10-CM | POA: Diagnosis not present

## 2023-08-17 DIAGNOSIS — E1142 Type 2 diabetes mellitus with diabetic polyneuropathy: Secondary | ICD-10-CM | POA: Diagnosis not present

## 2023-08-17 DIAGNOSIS — D631 Anemia in chronic kidney disease: Secondary | ICD-10-CM | POA: Diagnosis not present

## 2023-08-17 DIAGNOSIS — N186 End stage renal disease: Secondary | ICD-10-CM | POA: Diagnosis not present

## 2023-08-17 DIAGNOSIS — E1122 Type 2 diabetes mellitus with diabetic chronic kidney disease: Secondary | ICD-10-CM | POA: Diagnosis not present

## 2023-08-17 DIAGNOSIS — I132 Hypertensive heart and chronic kidney disease with heart failure and with stage 5 chronic kidney disease, or end stage renal disease: Secondary | ICD-10-CM | POA: Diagnosis not present

## 2023-08-17 DIAGNOSIS — I5042 Chronic combined systolic (congestive) and diastolic (congestive) heart failure: Secondary | ICD-10-CM | POA: Diagnosis not present

## 2023-08-18 DIAGNOSIS — E1122 Type 2 diabetes mellitus with diabetic chronic kidney disease: Secondary | ICD-10-CM | POA: Diagnosis not present

## 2023-08-18 DIAGNOSIS — D631 Anemia in chronic kidney disease: Secondary | ICD-10-CM | POA: Diagnosis not present

## 2023-08-18 DIAGNOSIS — N2581 Secondary hyperparathyroidism of renal origin: Secondary | ICD-10-CM | POA: Diagnosis not present

## 2023-08-18 DIAGNOSIS — Z992 Dependence on renal dialysis: Secondary | ICD-10-CM | POA: Diagnosis not present

## 2023-08-18 DIAGNOSIS — N186 End stage renal disease: Secondary | ICD-10-CM | POA: Diagnosis not present

## 2023-08-21 DIAGNOSIS — D631 Anemia in chronic kidney disease: Secondary | ICD-10-CM | POA: Diagnosis not present

## 2023-08-21 DIAGNOSIS — N186 End stage renal disease: Secondary | ICD-10-CM | POA: Diagnosis not present

## 2023-08-21 DIAGNOSIS — E1122 Type 2 diabetes mellitus with diabetic chronic kidney disease: Secondary | ICD-10-CM | POA: Diagnosis not present

## 2023-08-21 DIAGNOSIS — N2581 Secondary hyperparathyroidism of renal origin: Secondary | ICD-10-CM | POA: Diagnosis not present

## 2023-08-21 DIAGNOSIS — Z992 Dependence on renal dialysis: Secondary | ICD-10-CM | POA: Diagnosis not present

## 2023-08-22 ENCOUNTER — Ambulatory Visit (INDEPENDENT_AMBULATORY_CARE_PROVIDER_SITE_OTHER): Payer: 59 | Admitting: Gastroenterology

## 2023-08-22 ENCOUNTER — Other Ambulatory Visit (HOSPITAL_COMMUNITY): Payer: Self-pay

## 2023-08-22 ENCOUNTER — Encounter (HOSPITAL_COMMUNITY): Payer: Self-pay

## 2023-08-22 ENCOUNTER — Encounter: Payer: Self-pay | Admitting: Gastroenterology

## 2023-08-22 VITALS — BP 112/70 | HR 77 | Ht 74.0 in | Wt 194.0 lb

## 2023-08-22 DIAGNOSIS — L97321 Non-pressure chronic ulcer of left ankle limited to breakdown of skin: Secondary | ICD-10-CM | POA: Diagnosis not present

## 2023-08-22 DIAGNOSIS — I87313 Chronic venous hypertension (idiopathic) with ulcer of bilateral lower extremity: Secondary | ICD-10-CM | POA: Diagnosis not present

## 2023-08-22 DIAGNOSIS — D631 Anemia in chronic kidney disease: Secondary | ICD-10-CM | POA: Diagnosis not present

## 2023-08-22 DIAGNOSIS — E1122 Type 2 diabetes mellitus with diabetic chronic kidney disease: Secondary | ICD-10-CM | POA: Diagnosis not present

## 2023-08-22 DIAGNOSIS — Z1211 Encounter for screening for malignant neoplasm of colon: Secondary | ICD-10-CM

## 2023-08-22 DIAGNOSIS — Z992 Dependence on renal dialysis: Secondary | ICD-10-CM

## 2023-08-22 DIAGNOSIS — E1142 Type 2 diabetes mellitus with diabetic polyneuropathy: Secondary | ICD-10-CM | POA: Diagnosis not present

## 2023-08-22 DIAGNOSIS — K5520 Angiodysplasia of colon without hemorrhage: Secondary | ICD-10-CM | POA: Diagnosis not present

## 2023-08-22 DIAGNOSIS — L97311 Non-pressure chronic ulcer of right ankle limited to breakdown of skin: Secondary | ICD-10-CM | POA: Diagnosis not present

## 2023-08-22 DIAGNOSIS — D649 Anemia, unspecified: Secondary | ICD-10-CM | POA: Diagnosis not present

## 2023-08-22 DIAGNOSIS — I5042 Chronic combined systolic (congestive) and diastolic (congestive) heart failure: Secondary | ICD-10-CM | POA: Diagnosis not present

## 2023-08-22 DIAGNOSIS — N186 End stage renal disease: Secondary | ICD-10-CM

## 2023-08-22 DIAGNOSIS — I132 Hypertensive heart and chronic kidney disease with heart failure and with stage 5 chronic kidney disease, or end stage renal disease: Secondary | ICD-10-CM | POA: Diagnosis not present

## 2023-08-22 MED ORDER — SANDOSTATIN LAR DEPOT 10 MG IM KIT
10.0000 mg | PACK | INTRAMUSCULAR | 5 refills | Status: DC
Start: 2023-08-22 — End: 2024-03-12
  Filled 2023-08-22 – 2023-09-25 (×3): qty 1, 28d supply, fill #0
  Filled 2023-10-12: qty 1, 28d supply, fill #1
  Filled 2023-11-15: qty 1, 28d supply, fill #2
  Filled 2023-12-12: qty 1, 28d supply, fill #3
  Filled 2024-01-12: qty 1, 28d supply, fill #4
  Filled 2024-02-06: qty 1, 28d supply, fill #5

## 2023-08-22 MED ORDER — SANDOSTATIN LAR DEPOT 10 MG IM KIT: 10.0000 mg | PACK | INTRAMUSCULAR | 5 refills | Status: DC

## 2023-08-22 NOTE — Patient Instructions (Addendum)
_______________________________________________________  If your blood pressure at your visit was 140/90 or greater, please contact your primary care physician to follow up on this.  _______________________________________________________  If you are age 74 or older, your body mass index should be between 23-30. Your Body mass index is 24.91 kg/m. If this is out of the aforementioned range listed, please consider follow up with your Primary Care Provider.  If you are age 2 or younger, your body mass index should be between 19-25. Your Body mass index is 24.91 kg/m. If this is out of the aformentioned range listed, please consider follow up with your Primary Care Provider.   ________________________________________________________  The [facility] providers would like to encourage you to use [facility] to communicate with providers for non-urgent requests or questions.  Due to long hold times on the telephone, sending your provider a message by [facility] may be a faster and more efficient way to get a response.  Please allow 48 business hours for a response.  Please remember that this is for non-urgent requests.  _______________________________________________________  Continue octreotide  Continue blood draws.  Thank you for entrusting me with your care and for choosing [facility], Dr. Ileene Patrick

## 2023-08-22 NOTE — Progress Notes (Signed)
HPI :  72 year old male here for a follow-up visit for anemia, AVMs, colon cancer screening.  I last saw him in July 2022.  Recall he has a significant medical history of end-stage renal disease on HD, CAD status post CABG, cardiomyopathy, HIV, COPD, diabetes, recurrent anemia due to AVMs.  Recall that he had numerous upper endoscopy and enteroscopy's done between March and July 2022.  His last enteroscopy was done in July 2022 with numerous gastric and duodenal AVMs ablated.  We were able to get him on octreotide Depo injections every 28 days following his discharge.  He is done pretty well on that, has not had any recurrent bleeding we are aware of.  He gets IV iron infusions with his dialysis at times and states his hemoglobins been stable, his nephrologist has been monitoring that.  His last colonoscopy was in December 2013 without any high risk or concerning pathology noted.  We discussed if he wanted to have another colonoscopy done or not.  He is mostly using a wheelchair for ambulation, his legs really bother him.  He is not very active.  He had an echo cardiogram in October of last year with an EF of 40 to 45%.  He has no family history of colon cancer.  He denies any problems with his bowels.  States he takes MiraLAX daily which works pretty well for him.     PREVIOUS ENDOSCOPIC EVALUATIONS / PERTINENT STUDIES    10/19/20 EGD for GI bleed Gastric AVMs s/p APC treatment. - No specimens collected   12/22/19 EGD --bleeding gastric AVMs   April 2020 EGD --Gastric and duodenal bulb AVMs   Dec 2013 screening colonoscopy --moderate diverticulosis --2 mm polyp ( removed but not retrieved)   December 22, 2020 EGD for melena Blood in the duodenal bulb. - Nodule found in the duodenum. steady non-pulsatile bleeding seen at base, very close to distal aspect pylorus so positioning difficult - ? AVM Treated with argon plasma coagulation (APC) - partially successful Clips (MR conditional) were  placed. hemostatic spray applied. No bleeding at end - The examination was otherwise normal.   January 09, 2021 EGD for  GI bleed -  Bleeding gastric AVMs status post successful APC therapy -- Otherwise unremarkable upper endoscopy   January 25, 2021 EGD for anemia / melena -Normal esophagus. - Gastritis. Biopsied. - A few bleeding angioectasias in the duodenum. Treated with argon plasma coagulation (APC). - Nodular mucosa in the duodenal bulb and in the first portion of the duodenum. - The examined portion of the jejunum was normal.   February 13, 2021 EGD - gastric AVM s/p APV, actively bleeding duodenal AVM s/p APC   July 15/2022 Enteroscopy -  Small hiatal hernia. - 15 non-bleeding angiodysplastic lesions in the stomach. Treated with argon plasma coagulation (APC). - Four non-bleeding angiodysplastic lesions in the duodenum. Treated with argon plasma coagulation (APC). - Nodular mucosa in the duodenal bulb. - The examined portion of the jejunum was normal.     05/02/21 Non con CTAP IMPRESSION: 1. No acute abnormality. 2. Extensive, severe calcified atherosclerotic changes, as described above. These include the coronary arteries, superior mesenteric artery and renal arteries. 3. Sigmoid colon diverticulosis. 4. Stable cardiomegaly.    CT abdomen / pelvis 09/30/22: IMPRESSION: 1. No acute findings in the abdomen or pelvis. Specifically, no evidence for bowel obstruction. 2. Left colonic diverticulosis without diverticulitis. 3. Punctate nonobstructing right renal stone. 4. Small left groin hernia contains only fat. 5.  Aortic Atherosclerosis (  ICD10-I70.0).  Echo 07/2022 - EF 40-45%    Past Medical History:  Diagnosis Date   Acute respiratory failure (HCC) 03/01/2018   Anemia    Arthritis    "all over; mostly knees and back" (02/28/2018)   Chronic combined systolic and diastolic CHF (congestive heart failure) (HCC)    Chronic lower back pain    stenosis   Community  acquired pneumonia 09/06/2013   COPD (chronic obstructive pulmonary disease) (HCC)    Coronary atherosclerosis of native coronary artery    a. 02/2003 s/p CABG x 2 (VG->RI, VG->RPDA; b. 11/2019 PCI: LM nl, LAD 90d, D3 50, RI 100, LCX 100p, OM3 100 - fills via L->L collats from D2/dLAD, RCA 100p, VG->RPDA ok, VG->RI 95 (3.5x48 Synergy XD DES).   Drug abuse (HCC)    hx; tested for cocaine as recently as 2/08. says he is not using drugs now - avoided defib. for this reason    ESRD (end stage renal disease) (HCC)    Hemo M-W-F- [facility]   Fall at home 10/2020   GERD (gastroesophageal reflux disease)    takes OTC meds as needed   GI bleeding    a. 11/2019 EGD: angiodysplastic lesions w/ bleeding s/p argon plasma/clipping/epi inj. Multiple admissions for the same.   Glaucoma    uses eye drops daily   Hepatitis B 1968   "tx'd w/isolation; caught it from toilet stools in gym"   History of blood transfusion 03/01/2019   History of colon polyps    benign   History of gout    takes Allopurinol daily as well as Colchicine-if needed (02/28/2018)   History of kidney stones    HTN (hypertension)    takes Coreg,Imdur.and Apresoline daily   Human immunodeficiency virus (HIV) disease (HCC) dx'd 1995   on Biktarvy as of 12/2020.     Hyperlipidemia    Ischemic cardiomyopathy    a. 01/2019 Echo: EF 40-45%, diffuse HK, mild basal septal hypertrophy. Diast dysfxn. Nl RV size/fxn. Sev dil LA. Triv MR/TR/PR.   Muscle spasm    takes Zanaflex as needed   Myocardial infarction (HCC) ~ 2004/2005   Nocturia    Peripheral neuropathy    takes gabapentin daily   Pneumonia    "at least twice" (02/28/2018)   SDH (subdural hematoma) (HCC)    Syphilis, unspecified    Type II diabetes mellitus (HCC) 2004   Lantus daily.Average fasting blood sugar 125-199   Wears glasses    Wears partial dentures      Past Surgical History:  Procedure Laterality Date   AV FISTULA PLACEMENT Left 08/02/2018   Procedure:  ARTERIOVENOUS (AV) FISTULA CREATION  left arm radiocephlic;  Surgeon: Cephus Shelling, MD;  Location: [facility] OR;  Service: Vascular;  Laterality: Left;   AV FISTULA PLACEMENT Left 08/01/2019   Procedure: LEFT BRACHIOCEPHALIC ARTERIOVENOUS (AV) FISTULA CREATION;  Surgeon: Larina Earthly, MD;  Location: [facility] OR;  Service: Vascular;  Laterality: Left;   AV FISTULA PLACEMENT Right 04/12/2022   Procedure: RIGHT UPPER EXTREMITY ARTERIOVENOUS (AV)  GRAFT INSERTION USING GORE STRETCH 4-7 MM;  Surgeon: Chuck Hint, MD;  Location: [facility] OR;  Service: Vascular;  Laterality: Right;   BASCILIC VEIN TRANSPOSITION Left 10/03/2019   Procedure: BASILIC VEIN TRANSPOSITION LEFT SECOND STAGE;  Surgeon: Larina Earthly, MD;  Location: [facility] OR;  Service: Vascular;  Laterality: Left;   BIOPSY  01/25/2021   Procedure: BIOPSY;  Surgeon: Sherrilyn Rist, MD;  Location: [facility] ENDOSCOPY;  Service: Gastroenterology;;  CARDIAC CATHETERIZATION  10/2002; 12/19/2004   Hattie Perch 03/08/2011   COLONOSCOPY  2013   Dr.John Marina Goodell    CORONARY ARTERY BYPASS GRAFT  02/24/2003   CABG X2/notes 03/08/2011   CORONARY STENT INTERVENTION N/A 12/19/2019   Procedure: CORONARY STENT INTERVENTION;  Surgeon: Corky Crafts, MD;  Location: [facility] INVASIVE CV LAB;  Service: Cardiovascular;  Laterality: N/A;   CORONARY ULTRASOUND/IVUS N/A 12/19/2019   Procedure: Intravascular Ultrasound/IVUS;  Surgeon: Corky Crafts, MD;  Location: [facility] INVASIVE CV LAB;  Service: Cardiovascular;  Laterality: N/A;   ENTEROSCOPY N/A 01/25/2021   Procedure: ENTEROSCOPY;  Surgeon: Sherrilyn Rist, MD;  Location: [facility] ENDOSCOPY;  Service: Gastroenterology;  Laterality: N/A;   ENTEROSCOPY N/A 02/13/2021   Procedure: ENTEROSCOPY;  Surgeon: Lynann Bologna, MD;  Location: [facility] ENDOSCOPY;  Service: Endoscopy;  Laterality: N/A;   ENTEROSCOPY N/A 05/07/2021   Procedure: ENTEROSCOPY;  Surgeon: Benancio Deeds, MD;  Location: [facility] ENDOSCOPY;  Service: Gastroenterology;  Laterality:  N/A;   ESOPHAGOGASTRODUODENOSCOPY (EGD) WITH PROPOFOL N/A 02/08/2019   Procedure: ESOPHAGOGASTRODUODENOSCOPY (EGD) WITH PROPOFOL;  Surgeon: Rachael Fee, MD;  Location: [facility] ENDOSCOPY;  Service: Gastroenterology;  Laterality: N/A;   ESOPHAGOGASTRODUODENOSCOPY (EGD) WITH PROPOFOL N/A 12/22/2019   Procedure: ESOPHAGOGASTRODUODENOSCOPY (EGD) WITH PROPOFOL;  Surgeon: Shellia Cleverly, DO;  Location: [facility] ENDOSCOPY;  Service: Gastroenterology;  Laterality: N/A;   ESOPHAGOGASTRODUODENOSCOPY (EGD) WITH PROPOFOL N/A 10/19/2020   Procedure: ESOPHAGOGASTRODUODENOSCOPY (EGD) WITH PROPOFOL;  Surgeon: Lynann Bologna, MD;  Location: [facility] ENDOSCOPY;  Service: Endoscopy;  Laterality: N/A;   ESOPHAGOGASTRODUODENOSCOPY (EGD) WITH PROPOFOL N/A 12/22/2020   Procedure: ESOPHAGOGASTRODUODENOSCOPY (EGD) WITH PROPOFOL;  Surgeon: Iva Boop, MD;  Location: [facility] ENDOSCOPY;  Service: Endoscopy;  Laterality: N/A;   ESOPHAGOGASTRODUODENOSCOPY (EGD) WITH PROPOFOL N/A 01/09/2021   Procedure: ESOPHAGOGASTRODUODENOSCOPY (EGD) WITH PROPOFOL;  Surgeon: Hilarie Fredrickson, MD;  Location: [facility] ENDOSCOPY;  Service: Endoscopy;  Laterality: N/A;   HEMOSTASIS CLIP PLACEMENT  12/22/2019   Procedure: HEMOSTASIS CLIP PLACEMENT;  Surgeon: Shellia Cleverly, DO;  Location: [facility] ENDOSCOPY;  Service: Gastroenterology;;   HEMOSTASIS CLIP PLACEMENT  12/22/2020   Procedure: HEMOSTASIS CLIP PLACEMENT;  Surgeon: Iva Boop, MD;  Location: [facility] ENDOSCOPY;  Service: Endoscopy;;   HEMOSTASIS CONTROL  12/22/2020   Procedure: HEMOSTASIS CONTROL/hemospray;  Surgeon: Iva Boop, MD;  Location: [facility] ENDOSCOPY;  Service: Endoscopy;;   HOT HEMOSTASIS N/A 02/08/2019   Procedure: HOT HEMOSTASIS (ARGON PLASMA COAGULATION/BICAP);  Surgeon: Rachael Fee, MD;  Location: [facility] ENDOSCOPY;  Service: Gastroenterology;  Laterality: N/A;   HOT HEMOSTASIS N/A 12/22/2019   Procedure: HOT HEMOSTASIS (ARGON PLASMA COAGULATION/BICAP);  Surgeon: Shellia Cleverly, DO;  Location: [facility]  ENDOSCOPY;  Service: Gastroenterology;  Laterality: N/A;   HOT HEMOSTASIS N/A 10/19/2020   Procedure: HOT HEMOSTASIS (ARGON PLASMA COAGULATION/BICAP);  Surgeon: Lynann Bologna, MD;  Location: [facility] ENDOSCOPY;  Service: Endoscopy;  Laterality: N/A;   HOT HEMOSTASIS N/A 12/22/2020   Procedure: HOT HEMOSTASIS (ARGON PLASMA COAGULATION/BICAP);  Surgeon: Iva Boop, MD;  Location: [facility] ENDOSCOPY;  Service: Endoscopy;  Laterality: N/A;   HOT HEMOSTASIS N/A 01/09/2021   Procedure: HOT HEMOSTASIS (ARGON PLASMA COAGULATION/BICAP);  Surgeon: Hilarie Fredrickson, MD;  Location: [facility] ENDOSCOPY;  Service: Endoscopy;  Laterality: N/A;   HOT HEMOSTASIS N/A 01/25/2021   Procedure: HOT HEMOSTASIS (ARGON PLASMA COAGULATION/BICAP);  Surgeon: Sherrilyn Rist, MD;  Location: [facility] ENDOSCOPY;  Service: Gastroenterology;  Laterality: N/A;   HOT HEMOSTASIS N/A 02/13/2021   Procedure: HOT HEMOSTASIS (ARGON PLASMA COAGULATION/BICAP);  Surgeon: Lynann Bologna, MD;  Location:  [facility] ENDOSCOPY;  Service: Endoscopy;  Laterality: N/A;   HOT HEMOSTASIS N/A 05/07/2021   Procedure: HOT HEMOSTASIS (ARGON PLASMA COAGULATION/BICAP);  Surgeon: Benancio Deeds, MD;  Location: [facility] ENDOSCOPY;  Service: Gastroenterology;  Laterality: N/A;   INSERTION OF DIALYSIS CATHETER N/A 02/08/2022   Procedure: INSERTION OF TUNNELED DIALYSIS CATHETER;  Surgeon: Chuck Hint, MD;  Location: [facility] OR;  Service: Vascular;  Laterality: N/A;   INTERTROCHANTERIC HIP FRACTURE SURGERY Left 11/2006   Hattie Perch 03/08/2011   IR FLUORO GUIDE CV LINE LEFT  03/14/2022   IR FLUORO GUIDE CV LINE RIGHT  07/24/2019   IR FLUORO GUIDE CV LINE RIGHT  07/30/2019   IR US GUIDE VASC ACCESS RIGHT  07/24/2019   IR US GUIDE VASC ACCESS RIGHT  07/30/2019   LAPAROSCOPIC CHOLECYSTECTOMY  05/2006   LIGATION OF ARTERIOVENOUS  FISTULA Left 02/08/2022   Procedure: LIGATION OF LEFT ARTERIOVENOUS  FISTULA;  Surgeon: Chuck Hint, MD;  Location: [facility] OR;  Service: Vascular;  Laterality: Left;    LIGATION OF COMPETING BRANCHES OF ARTERIOVENOUS FISTULA Left 11/05/2018   Procedure: LIGATION OF COMPETING BRANCHES OF ARTERIOVENOUS FISTULA  LEFT  ARM;  Surgeon: Cephus Shelling, MD;  Location: [facility] OR;  Service: Vascular;  Laterality: Left;   LUMBAR LAMINECTOMY/DECOMPRESSION MICRODISCECTOMY N/A 02/29/2016   Procedure: Left L4-5 Lateral Recess Decompression, Removal Extradural Intraspinal Facet Cyst;  Surgeon: Eldred Manges, MD;  Location: [facility] OR;  Service: Orthopedics;  Laterality: N/A;   METATARSAL HEAD EXCISION Right 07/20/2022   Procedure: Irragation and debridement of right foot wound with extraction of all necrotic soft tissue and bone;  Surgeon: Pilar Plate, DPM;  Location: [facility] OR;  Service: Podiatry;  Laterality: Right;  Right 4th Met head and base of proximal phalanx resection   MULTIPLE TOOTH EXTRACTIONS     ORIF MANDIBULAR FRACTURE Left 08/13/2004   ORIF of left body fracture mandible with KLS Martin 2.3-mm six hole/notes 03/08/2011   RIGHT/LEFT HEART CATH AND CORONARY/GRAFT ANGIOGRAPHY N/A 12/19/2019   Procedure: RIGHT/LEFT HEART CATH AND CORONARY/GRAFT ANGIOGRAPHY;  Surgeon: Corky Crafts, MD;  Location: [facility] INVASIVE CV LAB;  Service: Cardiovascular;  Laterality: N/A;   SCLEROTHERAPY  12/22/2019   Procedure: SCLEROTHERAPY;  Surgeon: Shellia Cleverly, DO;  Location: [facility] ENDOSCOPY;  Service: Gastroenterology;;   Susa Day  02/13/2021   Procedure: Susa Day;  Surgeon: Lynann Bologna, MD;  Location: [facility] ENDOSCOPY;  Service: Endoscopy;;   Family History  Problem Relation Age of Onset   Heart failure Father    Hypertension Father    Diabetes Brother    Heart attack Brother    Alzheimer's disease Mother    Stroke Sister    Diabetes Sister    Alzheimer's disease Sister    Hypertension Brother    Diabetes Brother    Drug abuse Brother    Colon cancer Neg Hx    Social History   Tobacco Use   Smoking status: Every Day    Current packs/day: 0.50    Average  packs/day: 0.5 packs/day for 43.0 years (21.5 ttl pk-yrs)    Types: Cigarettes    Passive exposure: Never   Smokeless tobacco: Never  Vaping Use   Vaping status: Never Used  Substance Use Topics   Alcohol use: Yes    Alcohol/week: 12.0 standard drinks of alcohol    Types: 12 Standard drinks or equivalent per week    Comment: occassional   Drug use: Not Currently    Types: Cocaine    Comment: hx of crack/cocaine  84yrs ago 10/01/2019- none   Current Outpatient Medications  Medication Sig Dispense Refill   acetaminophen (TYLENOL) 650 MG CR tablet Take 1,300 mg by mouth 3 (three) times daily.     albuterol (PROVENTIL) (2.5 MG/3ML) 0.083% nebulizer solution Take 3 mLs (2.5 mg total) by nebulization every 6 (six) hours as needed for wheezing or shortness of breath. 75 mL 2   allopurinol (ZYLOPRIM) 100 MG tablet Take one tablet by mouth once daily. 90 tablet 2   atorvastatin (LIPITOR) 10 MG tablet TAKE 1 TABLET(10 MG) BY MOUTH DAILY 30 tablet 0   B-D UF III MINI PEN NEEDLES 31G X 5 MM MISC USE 4 TIMES DAILY 100 each 11   BIKTARVY 50-200-25 MG TABS tablet Take 1 tablet by mouth daily. 30 tablet 5   Calcium Acetate 667 MG TABS Take 667-2,001 mg by mouth See admin instructions. Take 2001 mg by mouth three times a day with meals and 667 mg with each snack     carvedilol (COREG) 3.125 MG tablet TAKE 1 TABLET(3.125 MG) BY MOUTH TWICE DAILY WITH A MEAL 60 tablet 0   diclofenac Sodium (VOLTAREN) 1 % GEL Apply 2 grams topically to the affected area three times daily as needed for pain. 600 g 1   folic acid (FOLVITE) 1 MG tablet TAKE 1 TABLET(1 MG) BY MOUTH DAILY 90 tablet 2   gabapentin (NEURONTIN) 300 MG capsule TAKE 1 CAPSULE BY MOUTH DAILY. 90 capsule 2   gentamicin ointment (GARAMYCIN) 0.1 % Apply 1 Application topically daily. 15 g 0   glucose blood (ONETOUCH ULTRA) test strip Check blood sugar three times daily E11.40 300 strip 3   Insulin Lispro (HUMALOG KWIKPEN [place]) Inject 20 Units into the skin 3  (three) times daily as needed (if blood sugar 150 or greater).     isosorbide mononitrate (IMDUR) 30 MG 24 hr tablet Take 1 tablet (30 mg total) by mouth daily. 90 tablet 2   Lancets (ONETOUCH DELICA PLUS LANCET33G) MISC USE 3 TIMES DAILY 300 each 3   latanoprost (XALATAN) 0.005 % ophthalmic solution Place 1 drop into both eyes at bedtime.     Menthol-Camphor (TIGER BALM ARTHRITIS RUB) 11-11 % CREA Apply 1 Application topically as needed (pain).     midodrine (PROAMATINE) 10 MG tablet Take 10 mg by mouth 3 (three) times a week.     multivitamin (RENA-VIT) TABS tablet Take 1 tablet by mouth daily.     octreotide (SANDOSTATIN LAR DEPOT) 10 MG injection Inject 10 mg into the muscle every 28 days. 1 kit 1   oxyCODONE-acetaminophen (PERCOCET) 5-325 MG tablet Take 1 tablet by mouth every 8 (eight) hours as needed for severe pain (pain score 7-10). 90 tablet 0   PREVIDENT 5000 PLUS 1.1 % CREA dental cream Place 1 Application onto teeth.     timolol (TIMOPTIC) 0.5 % ophthalmic solution Place 1 drop into both eyes in the morning.     umeclidinium-vilanterol (ANORO ELLIPTA) 62.5-25 MCG/ACT AEPB Inhale 1 puff into the lungs daily. 60 each 11   No current facility-administered medications for this visit.   Allergies  Allergen Reactions   Augmentin [Amoxicillin-Pot Clavulanate] Diarrhea and Other (See Comments)    Severe diarrhea   Morphine Sulfate Anaphylaxis    Can't mix with oxycodone    Mucinex Fast-Max Other (See Comments)    Intense sweating    Amphetamines Other (See Comments)    Unknown reaction     Review of Systems: All systems reviewed and negative except  where noted in HPI.   Lab Results  Component Value Date   WBC 5.2 06/21/2023   HGB 9.8 (L) 06/21/2023   HCT 31.4 (L) 06/21/2023   MCV 107.2 (H) 06/21/2023   PLT 157 06/21/2023   Lab Results  Component Value Date   ALT 11 09/30/2022   AST 20 09/30/2022   ALKPHOS 103 02/02/2023   BILITOT 0.6 09/30/2022     Physical  Exam: BP 112/70   Pulse 77   Ht 6\' 2"  (1.88 m)   Wt 194 lb (88 kg)   BMI 24.91 kg/m  Constitutional: Pleasant,well-developed, male in no acute distress. Neurological: Alert and oriented to person place and time. Psychiatric: Normal mood and affect. Behavior is normal.   ASSESSMENT: 72 y.o. male here for assessment of the following  1. AVM (arteriovenous malformation) of small bowel, acquired   2. Anemia, unspecified type   3. ESRD on hemodialysis (HCC)   4. Colon cancer screening    As above, numerous upper tract AVMs leading to bleeding and anemia in the past with multiple hospitalizations in 2022.  His last enteroscopy was remarkable for numerous AVMs which led to extensive ablation.  He has since been on octreotide Depo injections and this has really worked to reduce his anemia/bleeding risk.  He gets IV iron infusions occasionally with his nephrologist and generally has been doing well in this regard, hemoglobin stable and stayed out of the hospital.  He will continue to have his blood counts monitored closely with nephrology and continue IV iron infusions as needed.  We will continue his octreotide Depo injections, he appears to be tolerating those well.  We otherwise had a good conversation about colon cancer screening.  His last colonoscopy showed no high risk lesions although he is overdue for this.  We discussed that generally we perform colon cancer screening for most patients up through age 60 however he has significant comorbidities, poor ambulation, colonoscopy would be very difficult for him.  He is higher than average risk for anesthesia.  We discussed risks of doing colonoscopy versus risks of not doing it, namely colon cancer.  He understands this and after our discussion today has decided against further colon cancer screening.  If he changes his mind moving forward he should let us know.  Any procedures for him would need to be done in the hospital given his comorbidities  and he has higher than average risk for anesthesia.  PLAN: - continue octreotide depot injections q 28 days - continue blood draws with nephrology - dialysis, getting IV iron PRN - had good discussion about CRC screening - he understands risks of doing it vs. Not doing it, declines for now, can let me know if he changes his mind - of note, this patient belongs to Dr. Marina Goodell but I have seen him a few times since his hospitalization in 2022.  Harlin Rain, MD [facility]

## 2023-08-23 ENCOUNTER — Telehealth: Payer: 59

## 2023-08-23 DIAGNOSIS — N2581 Secondary hyperparathyroidism of renal origin: Secondary | ICD-10-CM | POA: Diagnosis not present

## 2023-08-23 DIAGNOSIS — N186 End stage renal disease: Secondary | ICD-10-CM | POA: Diagnosis not present

## 2023-08-23 DIAGNOSIS — E1122 Type 2 diabetes mellitus with diabetic chronic kidney disease: Secondary | ICD-10-CM | POA: Diagnosis not present

## 2023-08-23 DIAGNOSIS — D631 Anemia in chronic kidney disease: Secondary | ICD-10-CM | POA: Diagnosis not present

## 2023-08-23 DIAGNOSIS — Z992 Dependence on renal dialysis: Secondary | ICD-10-CM | POA: Diagnosis not present

## 2023-08-23 NOTE — Telephone Encounter (Signed)
Patient states that his stomach was upset (constipated). Patient took 3 spoonfuls of baking soda followed by hot water and now his stomach is cramping. Patient has now had several runny bowel movements, tingling in hands and feet. Patient is questioning if this could be related to taking the baking soda and if [name], MD has any recommendations . Patient confirmed the cramping and tingling was not present prior to taking backing soda

## 2023-08-23 NOTE — Telephone Encounter (Signed)
Providers response:  [name], MD  You1 hour ago (2:16 PM)    He needs to take miralax for constipation and not baking soda, if his symptoms does not improve he needs to be seen in clinic.    Spoke with patient and he verbalized understanding. Patient stated that he feels better since we last spoke

## 2023-08-24 ENCOUNTER — Other Ambulatory Visit (HOSPITAL_COMMUNITY): Payer: Self-pay

## 2023-08-24 ENCOUNTER — Ambulatory Visit: Payer: Self-pay

## 2023-08-24 ENCOUNTER — Ambulatory Visit (HOSPITAL_COMMUNITY)
Admission: RE | Admit: 2023-08-24 | Discharge: 2023-08-24 | Disposition: A | Discharge status: Home / Self Care | Payer: 59 | Source: Ambulatory Visit | Attending: Gastroenterology | Admitting: Gastroenterology

## 2023-08-24 ENCOUNTER — Encounter (HOSPITAL_COMMUNITY): Payer: Self-pay

## 2023-08-24 DIAGNOSIS — I129 Hypertensive chronic kidney disease with stage 1 through stage 4 chronic kidney disease, or unspecified chronic kidney disease: Secondary | ICD-10-CM | POA: Diagnosis not present

## 2023-08-24 DIAGNOSIS — Q2730 Arteriovenous malformation, site unspecified: Secondary | ICD-10-CM | POA: Diagnosis not present

## 2023-08-24 DIAGNOSIS — D649 Anemia, unspecified: Secondary | ICD-10-CM | POA: Diagnosis not present

## 2023-08-24 DIAGNOSIS — Z992 Dependence on renal dialysis: Secondary | ICD-10-CM | POA: Diagnosis not present

## 2023-08-24 DIAGNOSIS — N186 End stage renal disease: Secondary | ICD-10-CM | POA: Diagnosis not present

## 2023-08-24 MED ORDER — OCTREOTIDE ACETATE 10 MG IM KIT
10.0000 mg | PACK | INTRAMUSCULAR | Status: DC
  Administered 2023-08-24: 10 mg via INTRAMUSCULAR
  Filled 2023-08-24: qty 1

## 2023-08-24 NOTE — Patient Outreach (Signed)
Care Coordination   Follow Up Visit Note   08/24/2023 Name: JUVENTINO GOHMAN MRN: [id number] DOB: Jan 01, 1951  BHAVYA SUVER is a 72 y.o. year old male who sees [name], MD for primary care. I spoke with  David Stall by phone today.  What matters to the patients health and wellness today?  Patient would like to establish with [facility] for primary care needs.     Goals Addressed             This Visit's Progress    To establish with [facility]       Care Coordination Interventions: Patient interviewed about adult health maintenance status including  patient's request with assistance to transition to [facility] , patient has yet to hear from the referral coordinator  Sent secure email to Cindra Eves RN referral coordinator with [facility] requesting a referral status update  Discussed plans with patient for ongoing care coordination follow up and provided patient with direct contact information for nurse care coordinator    Interventions Today    Flowsheet Row Most Recent Value  Chronic Disease   Chronic disease during today's visit Chronic Kidney Disease/End Stage Renal Disease (ESRD)  General Interventions   General Interventions Discussed/Reviewed Doctor Visits, Communication with  Doctor Visits Discussed/Reviewed Doctor Visits Discussed, Doctor Visits Reviewed, PCP  Communication with RN  Cindra Eves RN w/[facility]]  Education Interventions   Education Provided Provided Education  Provided Verbal Education On When to see the doctor, Medication          SDOH assessments and interventions completed:  No     Care Coordination Interventions:  Yes, provided   Follow up plan: Follow up call scheduled for 08/31/23 @2 :30 PM    Encounter Outcome:  Patient Visit Completed

## 2023-08-24 NOTE — Patient Instructions (Signed)
Visit Information  Thank you for taking time to visit with me today. Please don't hesitate to contact me if I can be of assistance to you.   Following are the goals we discussed today:   Goals Addressed             This Visit's Progress    To establish with [facility]       Care Coordination Interventions: Patient interviewed about adult health maintenance status including  patient's request with assistance to transition to [facility] , patient has yet to hear from the referral coordinator  Sent secure email to Cindra Eves RN referral coordinator with [facility] requesting a referral status update  Discussed plans with patient for ongoing care coordination follow up and provided patient with direct contact information for nurse care coordinator          Our next appointment is by telephone on 08/31/23 at 2:30 PM  Please call the care guide team at [phone] if you need to cancel or reschedule your appointment.   If you are experiencing a Mental Health or Behavioral Health Crisis or need someone to talk to, please call 1-800-273-TALK (toll free, 24 hour hotline)  The patient verbalized understanding of instructions, educational materials, and care plan provided today and DECLINED offer to receive copy of patient instructions, educational materials, and care plan.   Delsa Sale RN BSN CCM [place]  [facility], [facility] Nurse Care Coordinator  Direct Dial: [phone] Website: [email or web address]

## 2023-08-25 ENCOUNTER — Other Ambulatory Visit: Payer: Self-pay

## 2023-08-25 ENCOUNTER — Encounter (HOSPITAL_COMMUNITY): Payer: Self-pay

## 2023-08-25 ENCOUNTER — Other Ambulatory Visit (HOSPITAL_COMMUNITY): Payer: Self-pay

## 2023-08-25 DIAGNOSIS — Z992 Dependence on renal dialysis: Secondary | ICD-10-CM | POA: Diagnosis not present

## 2023-08-25 DIAGNOSIS — D509 Iron deficiency anemia, unspecified: Secondary | ICD-10-CM | POA: Diagnosis not present

## 2023-08-25 DIAGNOSIS — N186 End stage renal disease: Secondary | ICD-10-CM | POA: Diagnosis not present

## 2023-08-25 DIAGNOSIS — N2581 Secondary hyperparathyroidism of renal origin: Secondary | ICD-10-CM | POA: Diagnosis not present

## 2023-08-25 DIAGNOSIS — D631 Anemia in chronic kidney disease: Secondary | ICD-10-CM | POA: Diagnosis not present

## 2023-08-28 ENCOUNTER — Other Ambulatory Visit: Payer: Self-pay

## 2023-08-28 DIAGNOSIS — N186 End stage renal disease: Secondary | ICD-10-CM | POA: Diagnosis not present

## 2023-08-28 DIAGNOSIS — N2581 Secondary hyperparathyroidism of renal origin: Secondary | ICD-10-CM | POA: Diagnosis not present

## 2023-08-28 DIAGNOSIS — D509 Iron deficiency anemia, unspecified: Secondary | ICD-10-CM | POA: Diagnosis not present

## 2023-08-28 DIAGNOSIS — D631 Anemia in chronic kidney disease: Secondary | ICD-10-CM | POA: Diagnosis not present

## 2023-08-28 DIAGNOSIS — Z992 Dependence on renal dialysis: Secondary | ICD-10-CM | POA: Diagnosis not present

## 2023-08-29 DIAGNOSIS — I132 Hypertensive heart and chronic kidney disease with heart failure and with stage 5 chronic kidney disease, or end stage renal disease: Secondary | ICD-10-CM | POA: Diagnosis not present

## 2023-08-29 DIAGNOSIS — L97311 Non-pressure chronic ulcer of right ankle limited to breakdown of skin: Secondary | ICD-10-CM | POA: Diagnosis not present

## 2023-08-29 DIAGNOSIS — I5042 Chronic combined systolic (congestive) and diastolic (congestive) heart failure: Secondary | ICD-10-CM | POA: Diagnosis not present

## 2023-08-29 DIAGNOSIS — L97321 Non-pressure chronic ulcer of left ankle limited to breakdown of skin: Secondary | ICD-10-CM | POA: Diagnosis not present

## 2023-08-29 DIAGNOSIS — E1122 Type 2 diabetes mellitus with diabetic chronic kidney disease: Secondary | ICD-10-CM | POA: Diagnosis not present

## 2023-08-29 DIAGNOSIS — I87313 Chronic venous hypertension (idiopathic) with ulcer of bilateral lower extremity: Secondary | ICD-10-CM | POA: Diagnosis not present

## 2023-08-30 DIAGNOSIS — D631 Anemia in chronic kidney disease: Secondary | ICD-10-CM | POA: Diagnosis not present

## 2023-08-30 DIAGNOSIS — N186 End stage renal disease: Secondary | ICD-10-CM | POA: Diagnosis not present

## 2023-08-30 DIAGNOSIS — D509 Iron deficiency anemia, unspecified: Secondary | ICD-10-CM | POA: Diagnosis not present

## 2023-08-30 DIAGNOSIS — Z992 Dependence on renal dialysis: Secondary | ICD-10-CM | POA: Diagnosis not present

## 2023-08-30 DIAGNOSIS — N2581 Secondary hyperparathyroidism of renal origin: Secondary | ICD-10-CM | POA: Diagnosis not present

## 2023-08-31 ENCOUNTER — Ambulatory Visit: Payer: Self-pay

## 2023-08-31 DIAGNOSIS — I5042 Chronic combined systolic (congestive) and diastolic (congestive) heart failure: Secondary | ICD-10-CM | POA: Diagnosis not present

## 2023-08-31 DIAGNOSIS — L97311 Non-pressure chronic ulcer of right ankle limited to breakdown of skin: Secondary | ICD-10-CM | POA: Diagnosis not present

## 2023-08-31 DIAGNOSIS — I132 Hypertensive heart and chronic kidney disease with heart failure and with stage 5 chronic kidney disease, or end stage renal disease: Secondary | ICD-10-CM | POA: Diagnosis not present

## 2023-08-31 DIAGNOSIS — E1122 Type 2 diabetes mellitus with diabetic chronic kidney disease: Secondary | ICD-10-CM | POA: Diagnosis not present

## 2023-08-31 DIAGNOSIS — I87313 Chronic venous hypertension (idiopathic) with ulcer of bilateral lower extremity: Secondary | ICD-10-CM | POA: Diagnosis not present

## 2023-08-31 DIAGNOSIS — L97321 Non-pressure chronic ulcer of left ankle limited to breakdown of skin: Secondary | ICD-10-CM | POA: Diagnosis not present

## 2023-09-01 DIAGNOSIS — Z992 Dependence on renal dialysis: Secondary | ICD-10-CM | POA: Diagnosis not present

## 2023-09-01 DIAGNOSIS — D509 Iron deficiency anemia, unspecified: Secondary | ICD-10-CM | POA: Diagnosis not present

## 2023-09-01 DIAGNOSIS — N186 End stage renal disease: Secondary | ICD-10-CM | POA: Diagnosis not present

## 2023-09-01 DIAGNOSIS — N2581 Secondary hyperparathyroidism of renal origin: Secondary | ICD-10-CM | POA: Diagnosis not present

## 2023-09-01 DIAGNOSIS — D631 Anemia in chronic kidney disease: Secondary | ICD-10-CM | POA: Diagnosis not present

## 2023-09-01 NOTE — Patient Instructions (Signed)
Visit Information  Thank you for taking time to visit with me today. Please don't hesitate to contact me if I can be of assistance to you.   Following are the goals we discussed today:   Goals Addressed             This Visit's Progress    To establish with [facility]   On track    Care Coordination Interventions: Patient interviewed about adult health maintenance status including  patient's request with assistance to transition to [facility] , patient has yet to hear from the referral coordinator  Collaborated with Cindra Eves RN with [facility], determined patient referral was received Discussed the team will have a meeting today to determine if patient will be accepted by [facility] for his primary care needs Placed outbound call patient to make him aware and patient verbalizes understanding         Our next appointment is by telephone on 09/05/23 at 09:00 AM  Please call the care guide team at [phone] if you need to cancel or reschedule your appointment.   If you are experiencing a Mental Health or Behavioral Health Crisis or need someone to talk to, please call 1-800-273-TALK (toll free, 24 hour hotline)  The patient verbalized understanding of instructions, educational materials, and care plan provided today and DECLINED offer to receive copy of patient instructions, educational materials, and care plan.   Delsa Sale RN BSN CCM [place]  [facility], [facility] Nurse Care Coordinator  Direct Dial: [phone] Website: [email or web address]

## 2023-09-01 NOTE — Patient Outreach (Signed)
  Care Coordination   Follow Up Visit Note   08/31/2023 Name: Jacob [name] MRN: [id number] DOB: Jul 30, 1951  Jacob [name] is a 72 y.o. year old male who sees [name], MD for primary care. I spoke with  David Stall by phone today.  What matters to the patients health and wellness today?  Patient would like to establish with [facility] for primary care needs.     Goals Addressed             This Visit's Progress    To establish with [facility]   On track    Care Coordination Interventions: Patient interviewed about adult health maintenance status including  patient's request with assistance to transition to [facility] , patient has yet to hear from the referral coordinator  Collaborated with Cindra Eves RN with [facility], determined patient referral was received Discussed the team will have a meeting today to determine if patient will be accepted by [facility] for his primary care needs Placed outbound call patient to make him aware and patient verbalizes understanding     Interventions Today    Flowsheet Row Most Recent Value  Chronic Disease   Chronic disease during today's visit Chronic Kidney Disease/End Stage Renal Disease (ESRD), Other  [chronic pain]  General Interventions   General Interventions Discussed/Reviewed General Interventions Discussed, General Interventions Reviewed, Doctor Visits, Communication with  Doctor Visits Discussed/Reviewed Doctor Visits Reviewed, Doctor Visits Discussed, PCP  Communication with RN  Cindra Eves RN [facility]]  Education Interventions   Education Provided Provided Education  Provided Verbal Education On When to see the doctor, Medication  Pharmacy Interventions   Pharmacy Dicussed/Reviewed Pharmacy Topics Reviewed, Pharmacy Topics Discussed          SDOH assessments and interventions completed:  No     Care Coordination Interventions:  Yes, provided   Follow up plan: Follow up call  scheduled for 09/05/23 @09 :00 AM    Encounter Outcome:  Patient Visit Completed

## 2023-09-03 DIAGNOSIS — L97329 Non-pressure chronic ulcer of left ankle with unspecified severity: Secondary | ICD-10-CM | POA: Diagnosis not present

## 2023-09-03 DIAGNOSIS — L97319 Non-pressure chronic ulcer of right ankle with unspecified severity: Secondary | ICD-10-CM | POA: Diagnosis not present

## 2023-09-04 DIAGNOSIS — N2581 Secondary hyperparathyroidism of renal origin: Secondary | ICD-10-CM | POA: Diagnosis not present

## 2023-09-04 DIAGNOSIS — D509 Iron deficiency anemia, unspecified: Secondary | ICD-10-CM | POA: Diagnosis not present

## 2023-09-04 DIAGNOSIS — N186 End stage renal disease: Secondary | ICD-10-CM | POA: Diagnosis not present

## 2023-09-04 DIAGNOSIS — Z992 Dependence on renal dialysis: Secondary | ICD-10-CM | POA: Diagnosis not present

## 2023-09-04 DIAGNOSIS — D631 Anemia in chronic kidney disease: Secondary | ICD-10-CM | POA: Diagnosis not present

## 2023-09-05 ENCOUNTER — Ambulatory Visit: Payer: Self-pay

## 2023-09-05 DIAGNOSIS — L97321 Non-pressure chronic ulcer of left ankle limited to breakdown of skin: Secondary | ICD-10-CM | POA: Diagnosis not present

## 2023-09-05 DIAGNOSIS — I5042 Chronic combined systolic (congestive) and diastolic (congestive) heart failure: Secondary | ICD-10-CM | POA: Diagnosis not present

## 2023-09-05 DIAGNOSIS — L97311 Non-pressure chronic ulcer of right ankle limited to breakdown of skin: Secondary | ICD-10-CM | POA: Diagnosis not present

## 2023-09-05 DIAGNOSIS — E1122 Type 2 diabetes mellitus with diabetic chronic kidney disease: Secondary | ICD-10-CM | POA: Diagnosis not present

## 2023-09-05 DIAGNOSIS — I132 Hypertensive heart and chronic kidney disease with heart failure and with stage 5 chronic kidney disease, or end stage renal disease: Secondary | ICD-10-CM | POA: Diagnosis not present

## 2023-09-05 DIAGNOSIS — I87313 Chronic venous hypertension (idiopathic) with ulcer of bilateral lower extremity: Secondary | ICD-10-CM | POA: Diagnosis not present

## 2023-09-05 NOTE — Patient Outreach (Signed)
  Care Coordination   Follow Up Visit Note   09/05/2023 Name: HAYYAN ELGART MRN: [id number] DOB: [birth year]  JACQUIS OOLEY is a 72 y.o. year old male who sees [name], MD for primary care. I left a message for Cindra Eves RN with [facility] to return my call.   What matters to the patients health and wellness today?  N/A    Goals Addressed             This Visit's Progress    To establish with [facility]       Care Coordination Interventions: Patient interviewed about adult health maintenance status including  patient's request with assistance to transition to [facility] , patient has yet to hear from the referral coordinator  Placed outbound call to [facility], left a message with Ocie Bob requesting Cindra Eves RN return my call to discuss if this patient will be accepted by [facility] for primary care Sent secure email to Cindra Eves RN requesting an update regarding patient acceptance to [facility] for primary care Placed unsuccessful call to patient, sent message to care guide requesting assistance with rescheduling patient's nurse care coordination call     Interventions Today    Flowsheet Row Most Recent Value  General Interventions   General Interventions Discussed/Reviewed Communication with  Communication with RN  Cindra Eves RN, [facility]]          SDOH assessments and interventions completed:  No     Care Coordination Interventions:  Yes, provided   Follow up plan: Follow up call with patient to be rescheduled per care guide   Encounter Outcome:  Patient Visit Completed

## 2023-09-06 DIAGNOSIS — N186 End stage renal disease: Secondary | ICD-10-CM | POA: Diagnosis not present

## 2023-09-06 DIAGNOSIS — Z992 Dependence on renal dialysis: Secondary | ICD-10-CM | POA: Diagnosis not present

## 2023-09-06 DIAGNOSIS — N2581 Secondary hyperparathyroidism of renal origin: Secondary | ICD-10-CM | POA: Diagnosis not present

## 2023-09-06 DIAGNOSIS — D631 Anemia in chronic kidney disease: Secondary | ICD-10-CM | POA: Diagnosis not present

## 2023-09-06 DIAGNOSIS — D509 Iron deficiency anemia, unspecified: Secondary | ICD-10-CM | POA: Diagnosis not present

## 2023-09-07 ENCOUNTER — Other Ambulatory Visit: Payer: Self-pay | Admitting: Sports Medicine

## 2023-09-07 NOTE — Telephone Encounter (Signed)
Mr. Stockham called regarding the status of his refill request for oxycodone. He said he called the on-call nurse last night regarding this and wants someone to call him regarding this refill request [phone]).

## 2023-09-07 NOTE — Telephone Encounter (Signed)
Spoke with patient want to know if the medication has been sent to pharamcy and I let him know that we are still waiting for [name], MD to approval the medication.  Patient is asking for medication for cold and that he states that he is not taking any medication at all.   Message sent to [name], MD

## 2023-09-07 NOTE — Telephone Encounter (Signed)
Monina called and stated that patient called her last night on call requesting refill on his Oxycodone. Stated that he has #8 left but will run out over the weekend. Monina offered him an appointment but patient refused.   Pended Rx and sent to Dr. Jacquenette Shone for approval.   Epic LR: 08/10/23 Contract Date: 09/07/22 (note added to upcoming appointment to update)

## 2023-09-08 ENCOUNTER — Ambulatory Visit: Payer: 59

## 2023-09-08 ENCOUNTER — Other Ambulatory Visit: Payer: Self-pay

## 2023-09-08 DIAGNOSIS — D631 Anemia in chronic kidney disease: Secondary | ICD-10-CM | POA: Diagnosis not present

## 2023-09-08 DIAGNOSIS — D509 Iron deficiency anemia, unspecified: Secondary | ICD-10-CM | POA: Diagnosis not present

## 2023-09-08 DIAGNOSIS — N186 End stage renal disease: Secondary | ICD-10-CM | POA: Diagnosis not present

## 2023-09-08 DIAGNOSIS — Z992 Dependence on renal dialysis: Secondary | ICD-10-CM | POA: Diagnosis not present

## 2023-09-08 DIAGNOSIS — N2581 Secondary hyperparathyroidism of renal origin: Secondary | ICD-10-CM | POA: Diagnosis not present

## 2023-09-08 MED ORDER — OXYCODONE-ACETAMINOPHEN 5-325 MG PO TABS: 1.0000 | ORAL_TABLET | Freq: Three times a day (TID) | ORAL | 0 refills | Status: DC | PRN

## 2023-09-08 NOTE — Telephone Encounter (Signed)
RX was sent to the wrong pharmacy. RX is pended again for local pharmacy- [facility]

## 2023-09-08 NOTE — Telephone Encounter (Signed)
I called [facility] and they said it was automatically canceled out as they do not mail controlled substances such as Oxycodone

## 2023-09-08 NOTE — Patient Outreach (Signed)
  Care Coordination   Follow Up Visit Note   09/08/2023 Name: MENDEL ROMEO MRN: [id number] DOB: 1950/11/28  CHAMAR PARMENTIER is a 72 y.o. year old male who sees [name], MD for primary care. I spoke with  David Stall by phone today.  What matters to the patients health and wellness today?  Patient would like to get his pain medication refilled without difficulty. He is looking for a new PCP provider.    Goals Addressed             This Visit's Progress    COMPLETED: To establish with [facility]       Care Coordination Interventions: Patient interviewed about adult health maintenance status including  patient's request with assistance to transition to [facility] , patient has yet to hear from the referral coordinator  Received voice message from Cindra Eves RN with [facility] advising they will not be able to accept patient at this time due to finding scheduling to be difficult for their providers with dialysis patients, notified patient of the decision    Interventions Today    Flowsheet Row Most Recent Value  Chronic Disease   Chronic disease during today's visit Other  [chronic pain]  General Interventions   General Interventions Discussed/Reviewed General Interventions Discussed, General Interventions Reviewed, Doctor Visits  Doctor Visits Discussed/Reviewed Doctor Visits Discussed, Doctor Visits Reviewed, PCP  Education Interventions   Education Provided Provided Education  Provided Verbal Education On Medication  Pharmacy Interventions   Pharmacy Dicussed/Reviewed Pharmacy Topics Discussed, Pharmacy Topics Reviewed, Medication Adherence, Medications and their functions          SDOH assessments and interventions completed:  No     Care Coordination Interventions:  Yes, provided   Follow up plan: Follow up call scheduled for 09/26/23 @11 :30 AM    Encounter Outcome:  Patient Visit Completed

## 2023-09-08 NOTE — Telephone Encounter (Signed)
Patient called again to check the status of his request. Patient emphasized that he needs this addressed today

## 2023-09-08 NOTE — Patient Instructions (Signed)
Visit Information  Thank you for taking time to visit with me today. Please don't hesitate to contact me if I can be of assistance to you.   Following are the goals we discussed today:   Goals Addressed             This Visit's Progress    COMPLETED: To establish with [facility]       Care Coordination Interventions: Patient interviewed about adult health maintenance status including  patient's request with assistance to transition to [facility] , patient has yet to hear from the referral coordinator  Received voice message from Cindra Eves RN with [facility] advising they will not be able to accept patient at this time due to finding scheduling to be difficult for their providers with dialysis patients, notified patient of the decision        Our next appointment is by telephone on 09/26/23 at 11:30 AM  Please call the care guide team at [phone] if you need to cancel or reschedule your appointment.   If you are experiencing a Mental Health or Behavioral Health Crisis or need someone to talk to, please call 1-800-273-TALK (toll free, 24 hour hotline)  The patient verbalized understanding of instructions, educational materials, and care plan provided today and DECLINED offer to receive copy of patient instructions, educational materials, and care plan.   Delsa Sale RN BSN CCM [place]  [facility], [facility] Nurse Care Coordinator  Direct Dial: [phone] Website: [email or web address]

## 2023-09-08 NOTE — Telephone Encounter (Signed)
Jacob Sheffield, MD  to [facility]  Me      09/08/23  4:13 PM  Sent to [facility] and plz cancel the one that was sent to other pharmacy , thanks.

## 2023-09-08 NOTE — Telephone Encounter (Signed)
Patient aware rx sent

## 2023-09-08 NOTE — Telephone Encounter (Signed)
Patient has now called the office 3 additional times stating he needs his rx sent to the pharmacy now

## 2023-09-11 DIAGNOSIS — N186 End stage renal disease: Secondary | ICD-10-CM | POA: Diagnosis not present

## 2023-09-11 DIAGNOSIS — D509 Iron deficiency anemia, unspecified: Secondary | ICD-10-CM | POA: Diagnosis not present

## 2023-09-11 DIAGNOSIS — D631 Anemia in chronic kidney disease: Secondary | ICD-10-CM | POA: Diagnosis not present

## 2023-09-11 DIAGNOSIS — N2581 Secondary hyperparathyroidism of renal origin: Secondary | ICD-10-CM | POA: Diagnosis not present

## 2023-09-11 DIAGNOSIS — Z992 Dependence on renal dialysis: Secondary | ICD-10-CM | POA: Diagnosis not present

## 2023-09-12 ENCOUNTER — Other Ambulatory Visit: Payer: Self-pay | Admitting: Internal Medicine

## 2023-09-12 DIAGNOSIS — I5042 Chronic combined systolic (congestive) and diastolic (congestive) heart failure: Secondary | ICD-10-CM | POA: Diagnosis not present

## 2023-09-12 DIAGNOSIS — I132 Hypertensive heart and chronic kidney disease with heart failure and with stage 5 chronic kidney disease, or end stage renal disease: Secondary | ICD-10-CM | POA: Diagnosis not present

## 2023-09-12 DIAGNOSIS — L97321 Non-pressure chronic ulcer of left ankle limited to breakdown of skin: Secondary | ICD-10-CM | POA: Diagnosis not present

## 2023-09-12 DIAGNOSIS — B20 Human immunodeficiency virus [HIV] disease: Secondary | ICD-10-CM

## 2023-09-12 DIAGNOSIS — L97311 Non-pressure chronic ulcer of right ankle limited to breakdown of skin: Secondary | ICD-10-CM | POA: Diagnosis not present

## 2023-09-12 DIAGNOSIS — E1122 Type 2 diabetes mellitus with diabetic chronic kidney disease: Secondary | ICD-10-CM | POA: Diagnosis not present

## 2023-09-12 DIAGNOSIS — I87313 Chronic venous hypertension (idiopathic) with ulcer of bilateral lower extremity: Secondary | ICD-10-CM | POA: Diagnosis not present

## 2023-09-13 ENCOUNTER — Telehealth: Payer: Self-pay | Admitting: Cardiovascular Disease

## 2023-09-13 ENCOUNTER — Telehealth: Payer: Self-pay

## 2023-09-13 DIAGNOSIS — D631 Anemia in chronic kidney disease: Secondary | ICD-10-CM | POA: Diagnosis not present

## 2023-09-13 DIAGNOSIS — Z992 Dependence on renal dialysis: Secondary | ICD-10-CM | POA: Diagnosis not present

## 2023-09-13 DIAGNOSIS — D509 Iron deficiency anemia, unspecified: Secondary | ICD-10-CM | POA: Diagnosis not present

## 2023-09-13 DIAGNOSIS — N2581 Secondary hyperparathyroidism of renal origin: Secondary | ICD-10-CM | POA: Diagnosis not present

## 2023-09-13 DIAGNOSIS — N186 End stage renal disease: Secondary | ICD-10-CM | POA: Diagnosis not present

## 2023-09-13 MED ORDER — ATORVASTATIN CALCIUM 10 MG PO TABS: ORAL_TABLET | ORAL | 0 refills | Status: DC

## 2023-09-13 NOTE — Telephone Encounter (Signed)
Gabriel Rung, PT w/ [facility] called and stated patient would like a rollator w/ seat send to an medical supply store. Her call back number is [phone].

## 2023-09-13 NOTE — Telephone Encounter (Signed)
*  STAT* If patient is at the pharmacy, call can be transferred to refill team.   1. Which medications need to be refilled? (please list name of each medication and dose if known) atorvastatin (LIPITOR) 10 MG tablet    2. Would you like to learn more about the convenience, safety, & potential cost savings by using the [facility]? No     3. Are you open to using the [facility] (Type [facility]. No  ).   4. Which pharmacy/location (including street and city if local pharmacy) is medication to be sent to? [facility] [phone] - [place], [place] - [address]    5. Do they need a 30 day or 90 day supply? 90

## 2023-09-13 NOTE — Telephone Encounter (Signed)
Refill faxed to the patient's pharmacy.

## 2023-09-13 NOTE — Telephone Encounter (Signed)
Jacob Sheffield, MD  You2 hours ago (12:00 PM)    Any DME needs face to face eval , he needs to be seen in clinic   Patient was advised and has appointment scheduled until for 09/20/23.

## 2023-09-14 DIAGNOSIS — I5042 Chronic combined systolic (congestive) and diastolic (congestive) heart failure: Secondary | ICD-10-CM | POA: Diagnosis not present

## 2023-09-14 DIAGNOSIS — L97311 Non-pressure chronic ulcer of right ankle limited to breakdown of skin: Secondary | ICD-10-CM | POA: Diagnosis not present

## 2023-09-14 DIAGNOSIS — I132 Hypertensive heart and chronic kidney disease with heart failure and with stage 5 chronic kidney disease, or end stage renal disease: Secondary | ICD-10-CM | POA: Diagnosis not present

## 2023-09-14 DIAGNOSIS — I87313 Chronic venous hypertension (idiopathic) with ulcer of bilateral lower extremity: Secondary | ICD-10-CM | POA: Diagnosis not present

## 2023-09-14 DIAGNOSIS — E1122 Type 2 diabetes mellitus with diabetic chronic kidney disease: Secondary | ICD-10-CM | POA: Diagnosis not present

## 2023-09-14 DIAGNOSIS — L97321 Non-pressure chronic ulcer of left ankle limited to breakdown of skin: Secondary | ICD-10-CM | POA: Diagnosis not present

## 2023-09-15 DIAGNOSIS — N2581 Secondary hyperparathyroidism of renal origin: Secondary | ICD-10-CM | POA: Diagnosis not present

## 2023-09-15 DIAGNOSIS — D509 Iron deficiency anemia, unspecified: Secondary | ICD-10-CM | POA: Diagnosis not present

## 2023-09-15 DIAGNOSIS — D631 Anemia in chronic kidney disease: Secondary | ICD-10-CM | POA: Diagnosis not present

## 2023-09-15 DIAGNOSIS — Z992 Dependence on renal dialysis: Secondary | ICD-10-CM | POA: Diagnosis not present

## 2023-09-15 DIAGNOSIS — N186 End stage renal disease: Secondary | ICD-10-CM | POA: Diagnosis not present

## 2023-09-17 DIAGNOSIS — N2581 Secondary hyperparathyroidism of renal origin: Secondary | ICD-10-CM | POA: Diagnosis not present

## 2023-09-17 DIAGNOSIS — N186 End stage renal disease: Secondary | ICD-10-CM | POA: Diagnosis not present

## 2023-09-17 DIAGNOSIS — D509 Iron deficiency anemia, unspecified: Secondary | ICD-10-CM | POA: Diagnosis not present

## 2023-09-17 DIAGNOSIS — D631 Anemia in chronic kidney disease: Secondary | ICD-10-CM | POA: Diagnosis not present

## 2023-09-17 DIAGNOSIS — Z992 Dependence on renal dialysis: Secondary | ICD-10-CM | POA: Diagnosis not present

## 2023-09-19 ENCOUNTER — Ambulatory Visit: Payer: 59 | Admitting: Family

## 2023-09-19 ENCOUNTER — Other Ambulatory Visit: Payer: Self-pay

## 2023-09-19 ENCOUNTER — Other Ambulatory Visit (HOSPITAL_COMMUNITY): Payer: Self-pay

## 2023-09-19 DIAGNOSIS — N2581 Secondary hyperparathyroidism of renal origin: Secondary | ICD-10-CM | POA: Diagnosis not present

## 2023-09-19 DIAGNOSIS — I5042 Chronic combined systolic (congestive) and diastolic (congestive) heart failure: Secondary | ICD-10-CM | POA: Diagnosis not present

## 2023-09-19 DIAGNOSIS — Z992 Dependence on renal dialysis: Secondary | ICD-10-CM | POA: Diagnosis not present

## 2023-09-19 DIAGNOSIS — N186 End stage renal disease: Secondary | ICD-10-CM | POA: Diagnosis not present

## 2023-09-19 DIAGNOSIS — I132 Hypertensive heart and chronic kidney disease with heart failure and with stage 5 chronic kidney disease, or end stage renal disease: Secondary | ICD-10-CM | POA: Diagnosis not present

## 2023-09-19 DIAGNOSIS — I87313 Chronic venous hypertension (idiopathic) with ulcer of bilateral lower extremity: Secondary | ICD-10-CM | POA: Diagnosis not present

## 2023-09-19 DIAGNOSIS — L97321 Non-pressure chronic ulcer of left ankle limited to breakdown of skin: Secondary | ICD-10-CM | POA: Diagnosis not present

## 2023-09-19 DIAGNOSIS — D631 Anemia in chronic kidney disease: Secondary | ICD-10-CM | POA: Diagnosis not present

## 2023-09-19 DIAGNOSIS — E1122 Type 2 diabetes mellitus with diabetic chronic kidney disease: Secondary | ICD-10-CM | POA: Diagnosis not present

## 2023-09-19 DIAGNOSIS — D509 Iron deficiency anemia, unspecified: Secondary | ICD-10-CM | POA: Diagnosis not present

## 2023-09-19 DIAGNOSIS — L97311 Non-pressure chronic ulcer of right ankle limited to breakdown of skin: Secondary | ICD-10-CM | POA: Diagnosis not present

## 2023-09-19 NOTE — Progress Notes (Signed)
Specialty Pharmacy Initial Fill Coordination Note  Jacob [name] is a 72 y.o. male contacted today regarding initial fill of specialty medication(s) Octreotide Acetate   Patient requested Courier to Provider Office   Delivery date: 09/25/23   Verified address: [facility] [address] [place], , [address]   Medication will be filled on 11.29.24.   Patient is aware of $0 copayment.

## 2023-09-20 ENCOUNTER — Other Ambulatory Visit (HOSPITAL_COMMUNITY): Payer: Self-pay

## 2023-09-20 ENCOUNTER — Ambulatory Visit (INDEPENDENT_AMBULATORY_CARE_PROVIDER_SITE_OTHER): Payer: 59 | Admitting: Sports Medicine

## 2023-09-20 ENCOUNTER — Encounter: Payer: Self-pay | Admitting: Sports Medicine

## 2023-09-20 ENCOUNTER — Other Ambulatory Visit: Payer: Self-pay

## 2023-09-20 VITALS — BP 98/80 | HR 91 | Temp 97.5°F | Resp 16 | Ht 74.0 in | Wt 196.0 lb

## 2023-09-20 DIAGNOSIS — R051 Acute cough: Secondary | ICD-10-CM

## 2023-09-20 DIAGNOSIS — M150 Primary generalized (osteo)arthritis: Secondary | ICD-10-CM

## 2023-09-20 DIAGNOSIS — J449 Chronic obstructive pulmonary disease, unspecified: Secondary | ICD-10-CM | POA: Diagnosis not present

## 2023-09-20 DIAGNOSIS — Z79899 Other long term (current) drug therapy: Secondary | ICD-10-CM | POA: Diagnosis not present

## 2023-09-20 DIAGNOSIS — L97519 Non-pressure chronic ulcer of other part of right foot with unspecified severity: Secondary | ICD-10-CM

## 2023-09-20 DIAGNOSIS — Z122 Encounter for screening for malignant neoplasm of respiratory organs: Secondary | ICD-10-CM | POA: Diagnosis not present

## 2023-09-20 DIAGNOSIS — I5042 Chronic combined systolic (congestive) and diastolic (congestive) heart failure: Secondary | ICD-10-CM | POA: Diagnosis not present

## 2023-09-20 DIAGNOSIS — Q2730 Arteriovenous malformation, site unspecified: Secondary | ICD-10-CM

## 2023-09-20 MED ORDER — BENZONATATE 200 MG PO CAPS
200.0000 mg | ORAL_CAPSULE | Freq: Two times a day (BID) | ORAL | 0 refills | Status: DC | PRN
Start: 2023-09-20 — End: 2023-12-01

## 2023-09-20 MED ORDER — AZITHROMYCIN 250 MG PO TABS
ORAL_TABLET | ORAL | 0 refills | Status: AC
Start: 2023-09-20 — End: 2023-09-25

## 2023-09-20 NOTE — Progress Notes (Signed)
Careteam: Patient Care Team: [name], MD as PCP - General (Internal Medicine) [name], MD (Inactive) as PCP - Infectious Diseases (Infectious Diseases) [name], MD as PCP - Cardiology (Cardiology) [name], MD as Consulting Physician (Ophthalmology) [name], MD as Consulting Physician (Cardiology) [name], MD (Inactive) as Consulting Physician (Infectious Diseases) [name], DPM as Consulting Physician (Podiatry) [name], [name], NP as Nurse Practitioner (Family Medicine) [place], [facility], [facility]  PLACE OF SERVICE:  [facility] CLINIC  Advanced Directive information Does Patient Have a Medical Advance Directive?: No, Does patient want to make changes to medical advance directive?: No - Patient declined  Allergies  Allergen Reactions   Augmentin [Amoxicillin-Pot Clavulanate] Diarrhea and Other (See Comments)    Severe diarrhea   Morphine Sulfate Anaphylaxis    Can't mix with oxycodone    Mucinex Fast-Max Other (See Comments)    Intense sweating    Amphetamines Other (See Comments)    Unknown reaction    Chief Complaint  Patient presents with   Medical Management of Chronic Issues    3 month follow up.   Health Maintenance    Discuss the need for Lipid panel, Lung cancer screening, and Hemoglobin A1C.    Immunizations    Discuss the need for [employer].     HPI: Patient is a 72 y.o. male  is here for follow up   COPD  Pt c/o productive cough wince September Bringing yellowish phlegm  Smokes cigarrattes every day  Says 1pack lasts for 2 days  He was treated with doxycycline in September  Denies no change in his breathing  Able to speak in full sentences Reports using anoro daily  Hasn't used albuterol in the last month   Chronic pain  C/o chronic pain  Takes oxycodone 3 pills/ day  Lives alone Has home health PT  Does home exercises once a week  Pt states that he cannot  too long due to pain  Requesting rolator walker   Rt ankle ulcer Pt says that he has ulcer on his ankle Reports that [facility] nurse did the dressing change yesterday  He did not want me to look at the wound    ESRD  On HD    H/O AVM  Follows with GI  Reviewed GI note  As per GI note  ''continue octreotide depot injections q 28 days - continue blood draws with nephrology - dialysis, getting IV iron PRN - had good discussion about CRC screening - he understands risks of doing it vs. Not doing it, declines for now, can let me know if he changes his mind''  Review of Systems:  Review of Systems  Constitutional:  Negative for chills and fever.  HENT:  Negative for congestion and sore throat.   Eyes:  Negative for double vision.  Respiratory:  Positive for cough and sputum production. Negative for shortness of breath.   Cardiovascular:  Negative for chest pain, palpitations and leg swelling.  Gastrointestinal:  Negative for abdominal pain, heartburn and nausea.  Genitourinary:  Negative for dysuria, frequency and hematuria.  Musculoskeletal:  Positive for joint pain. Negative for falls and myalgias.  Neurological:  Negative for dizziness, sensory change and focal weakness.   Negative unless indicated in HPI.   Past Medical History:  Diagnosis Date   Acute respiratory failure (HCC) 03/01/2018   Anemia    Arthritis    "all over; mostly knees and back" (02/28/2018)   Chronic combined systolic  and diastolic CHF (congestive heart failure) (HCC)    Chronic lower back pain    stenosis   Community acquired pneumonia 09/06/2013   COPD (chronic obstructive pulmonary disease) (HCC)    Coronary atherosclerosis of native coronary artery    a. 02/2003 s/p CABG x 2 (VG->RI, VG->RPDA; b. 11/2019 PCI: LM nl, LAD 90d, D3 50, RI 100, LCX 100p, OM3 100 - fills via L->L collats from D2/dLAD, RCA 100p, VG->RPDA ok, VG->RI 95 (3.5x48 Synergy XD DES).   Drug abuse (HCC)    hx; tested for cocaine as recently  as 2/08. says he is not using drugs now - avoided defib. for this reason    ESRD (end stage renal disease) (HCC)    Hemo M-W-F- [facility]   Fall at home 10/2020   GERD (gastroesophageal reflux disease)    takes OTC meds as needed   GI bleeding    a. 11/2019 EGD: angiodysplastic lesions w/ bleeding s/p argon plasma/clipping/epi inj. Multiple admissions for the same.   Glaucoma    uses eye drops daily   Hepatitis B 1968   "tx'd w/isolation; caught it from toilet stools in gym"   History of blood transfusion 03/01/2019   History of colon polyps    benign   History of gout    takes Allopurinol daily as well as Colchicine-if needed (02/28/2018)   History of kidney stones    HTN (hypertension)    takes Coreg,Imdur.and Apresoline daily   Human immunodeficiency virus (HIV) disease (HCC) dx'd 1995   on Biktarvy as of 12/2020.     Hyperlipidemia    Ischemic cardiomyopathy    a. 01/2019 Echo: EF 40-45%, diffuse HK, mild basal septal hypertrophy. Diast dysfxn. Nl RV size/fxn. Sev dil LA. Triv MR/TR/PR.   Muscle spasm    takes Zanaflex as needed   Myocardial infarction (HCC) ~ 2004/2005   Nocturia    Peripheral neuropathy    takes gabapentin daily   Pneumonia    "at least twice" (02/28/2018)   SDH (subdural hematoma) (HCC)    Syphilis, unspecified    Type II diabetes mellitus (HCC) 2004   Lantus daily.Average fasting blood sugar 125-199   Wears glasses    Wears partial dentures    Past Surgical History:  Procedure Laterality Date   AV FISTULA PLACEMENT Left 08/02/2018   Procedure: ARTERIOVENOUS (AV) FISTULA CREATION  left arm radiocephlic;  Surgeon: Cephus Shelling, MD;  Location[facility] OR;  Service: Vascular;  Laterality: Left;   AV FISTULA PLACEMENT Left 08/01/2019   Procedure: LEFT BRACHIOCEPHALIC ARTERIOVENOUS (AV) FISTULA CREATION;  Surgeon: Larina Earthly, MD;  Location: [facility] OR;  Service: Vascular;  Laterality: Left;   AV FISTULA PLACEMENT Right 04/12/2022   Procedure: RIGHT UPPER  EXTREMITY ARTERIOVENOUS (AV)  GRAFT INSERTION USING GORE STRETCH 4-7 MM;  Surgeon: Chuck Hint, MD;  Location: [facility] OR;  Service: Vascular;  Laterality: Right;   BASCILIC VEIN TRANSPOSITION Left 10/03/2019   Procedure: BASILIC VEIN TRANSPOSITION LEFT SECOND STAGE;  Surgeon: Larina Earthly, MD;  Location: [facility] OR;  Service: Vascular;  Laterality: Left;   BIOPSY  01/25/2021   Procedure: BIOPSY;  Surgeon: Sherrilyn Rist, MD;  Location: [facility] ENDOSCOPY;  Service: Gastroenterology;;   CARDIAC CATHETERIZATION  10/2002; 12/19/2004   Hattie Perch 03/08/2011   COLONOSCOPY  2013   Dr.John Marina Goodell    CORONARY ARTERY BYPASS GRAFT  02/24/2003   CABG X2/notes 03/08/2011   CORONARY STENT INTERVENTION N/A 12/19/2019   Procedure: CORONARY STENT INTERVENTION;  Surgeon: Corky Crafts, MD;  Location: [facility] INVASIVE CV LAB;  Service: Cardiovascular;  Laterality: N/A;   CORONARY ULTRASOUND/IVUS N/A 12/19/2019   Procedure: Intravascular Ultrasound/IVUS;  Surgeon: Corky Crafts, MD;  Location: [facility] INVASIVE CV LAB;  Service: Cardiovascular;  Laterality: N/A;   ENTEROSCOPY N/A 01/25/2021   Procedure: ENTEROSCOPY;  Surgeon: Sherrilyn Rist, MD;  Location: [facility] ENDOSCOPY;  Service: Gastroenterology;  Laterality: N/A;   ENTEROSCOPY N/A 02/13/2021   Procedure: ENTEROSCOPY;  Surgeon: Lynann Bologna, MD;  Location: [facility] ENDOSCOPY;  Service: Endoscopy;  Laterality: N/A;   ENTEROSCOPY N/A 05/07/2021   Procedure: ENTEROSCOPY;  Surgeon: Benancio Deeds, MD;  Location: [facility] ENDOSCOPY;  Service: Gastroenterology;  Laterality: N/A;   ESOPHAGOGASTRODUODENOSCOPY (EGD) WITH PROPOFOL N/A 02/08/2019   Procedure: ESOPHAGOGASTRODUODENOSCOPY (EGD) WITH PROPOFOL;  Surgeon: Rachael Fee, MD;  Location: [facility] ENDOSCOPY;  Service: Gastroenterology;  Laterality: N/A;   ESOPHAGOGASTRODUODENOSCOPY (EGD) WITH PROPOFOL N/A 12/22/2019   Procedure: ESOPHAGOGASTRODUODENOSCOPY (EGD) WITH PROPOFOL;  Surgeon: Shellia Cleverly, DO;  Location: [facility]  ENDOSCOPY;  Service: Gastroenterology;  Laterality: N/A;   ESOPHAGOGASTRODUODENOSCOPY (EGD) WITH PROPOFOL N/A 10/19/2020   Procedure: ESOPHAGOGASTRODUODENOSCOPY (EGD) WITH PROPOFOL;  Surgeon: Lynann Bologna, MD;  Location: [facility] ENDOSCOPY;  Service: Endoscopy;  Laterality: N/A;   ESOPHAGOGASTRODUODENOSCOPY (EGD) WITH PROPOFOL N/A 12/22/2020   Procedure: ESOPHAGOGASTRODUODENOSCOPY (EGD) WITH PROPOFOL;  Surgeon: Iva Boop, MD;  Location: [facility] ENDOSCOPY;  Service: Endoscopy;  Laterality: N/A;   ESOPHAGOGASTRODUODENOSCOPY (EGD) WITH PROPOFOL N/A 01/09/2021   Procedure: ESOPHAGOGASTRODUODENOSCOPY (EGD) WITH PROPOFOL;  Surgeon: Hilarie Fredrickson, MD;  Location: [facility] ENDOSCOPY;  Service: Endoscopy;  Laterality: N/A;   HEMOSTASIS CLIP PLACEMENT  12/22/2019   Procedure: HEMOSTASIS CLIP PLACEMENT;  Surgeon: Shellia Cleverly, DO;  Location: [facility] ENDOSCOPY;  Service: Gastroenterology;;   HEMOSTASIS CLIP PLACEMENT  12/22/2020   Procedure: HEMOSTASIS CLIP PLACEMENT;  Surgeon: Iva Boop, MD;  Location: [facility] ENDOSCOPY;  Service: Endoscopy;;   HEMOSTASIS CONTROL  12/22/2020   Procedure: HEMOSTASIS CONTROL/hemospray;  Surgeon: Iva Boop, MD;  Location: [facility] ENDOSCOPY;  Service: Endoscopy;;   HOT HEMOSTASIS N/A 02/08/2019   Procedure: HOT HEMOSTASIS (ARGON PLASMA COAGULATION/BICAP);  Surgeon: Rachael Fee, MD;  Location: [facility] ENDOSCOPY;  Service: Gastroenterology;  Laterality: N/A;   HOT HEMOSTASIS N/A 12/22/2019   Procedure: HOT HEMOSTASIS (ARGON PLASMA COAGULATION/BICAP);  Surgeon: Shellia Cleverly, DO;  Location: [facility] ENDOSCOPY;  Service: Gastroenterology;  Laterality: N/A;   HOT HEMOSTASIS N/A 10/19/2020   Procedure: HOT HEMOSTASIS (ARGON PLASMA COAGULATION/BICAP);  Surgeon: Lynann Bologna, MD;  Location: [facility] ENDOSCOPY;  Service: Endoscopy;  Laterality: N/A;   HOT HEMOSTASIS N/A 12/22/2020   Procedure: HOT HEMOSTASIS (ARGON PLASMA COAGULATION/BICAP);  Surgeon: Iva Boop, MD;  Location: [facility] ENDOSCOPY;  Service:  Endoscopy;  Laterality: N/A;   HOT HEMOSTASIS N/A 01/09/2021   Procedure: HOT HEMOSTASIS (ARGON PLASMA COAGULATION/BICAP);  Surgeon: Hilarie Fredrickson, MD;  Location: [facility] ENDOSCOPY;  Service: Endoscopy;  Laterality: N/A;   HOT HEMOSTASIS N/A 01/25/2021   Procedure: HOT HEMOSTASIS (ARGON PLASMA COAGULATION/BICAP);  Surgeon: Sherrilyn Rist, MD;  Location: [facility] ENDOSCOPY;  Service: Gastroenterology;  Laterality: N/A;   HOT HEMOSTASIS N/A 02/13/2021   Procedure: HOT HEMOSTASIS (ARGON PLASMA COAGULATION/BICAP);  Surgeon: Lynann Bologna, MD;  Location: [facility] ENDOSCOPY;  Service: Endoscopy;  Laterality: N/A;   HOT HEMOSTASIS N/A 05/07/2021   Procedure: HOT HEMOSTASIS (ARGON PLASMA COAGULATION/BICAP);  Surgeon: Benancio Deeds, MD;  Location: [facility] ENDOSCOPY;  Service: Gastroenterology;  Laterality: N/A;   INSERTION OF DIALYSIS CATHETER N/A 02/08/2022  Procedure: INSERTION OF TUNNELED DIALYSIS CATHETER;  Surgeon: Chuck Hint, MD;  Location: [facility] OR;  Service: Vascular;  Laterality: N/A;   INTERTROCHANTERIC HIP FRACTURE SURGERY Left 11/2006   Hattie Perch 03/08/2011   IR FLUORO GUIDE CV LINE LEFT  03/14/2022   IR FLUORO GUIDE CV LINE RIGHT  07/24/2019   IR FLUORO GUIDE CV LINE RIGHT  07/30/2019   IR US GUIDE VASC ACCESS RIGHT  07/24/2019   IR US GUIDE VASC ACCESS RIGHT  07/30/2019   LAPAROSCOPIC CHOLECYSTECTOMY  05/2006   LIGATION OF ARTERIOVENOUS  FISTULA Left 02/08/2022   Procedure: LIGATION OF LEFT ARTERIOVENOUS  FISTULA;  Surgeon: Chuck Hint, MD;  Location: [name] OR;  Service: Vascular;  Laterality: Left;   LIGATION OF COMPETING BRANCHES OF ARTERIOVENOUS FISTULA Left 11/05/2018   Procedure: LIGATION OF COMPETING BRANCHES OF ARTERIOVENOUS FISTULA  LEFT  ARM;  Surgeon: Cephus Shelling, MD;  Location: [facility] OR;  Service: Vascular;  Laterality: Left;   LUMBAR LAMINECTOMY/DECOMPRESSION MICRODISCECTOMY N/A 02/29/2016   Procedure: Left L4-5 Lateral Recess Decompression, Removal Extradural Intraspinal Facet  Cyst;  Surgeon: Eldred Manges, MD;  Location: [facility] OR;  Service: Orthopedics;  Laterality: N/A;   METATARSAL HEAD EXCISION Right 07/20/2022   Procedure: Irragation and debridement of right foot wound with extraction of all necrotic soft tissue and bone;  Surgeon: Pilar Plate, DPM;  Location: [facility] OR;  Service: Podiatry;  Laterality: Right;  Right 4th Met head and base of proximal phalanx resection   MULTIPLE TOOTH EXTRACTIONS     ORIF MANDIBULAR FRACTURE Left 08/13/2004   ORIF of left body fracture mandible with KLS Martin 2.3-mm six hole/notes 03/08/2011   RIGHT/LEFT HEART CATH AND CORONARY/GRAFT ANGIOGRAPHY N/A 12/19/2019   Procedure: RIGHT/LEFT HEART CATH AND CORONARY/GRAFT ANGIOGRAPHY;  Surgeon: Corky Crafts, MD;  Location: [facility] INVASIVE CV LAB;  Service: Cardiovascular;  Laterality: N/A;   SCLEROTHERAPY  12/22/2019   Procedure: SCLEROTHERAPY;  Surgeon: Shellia Cleverly, DO;  Location: [facility] ENDOSCOPY;  Service: Gastroenterology;;   Susa Day  02/13/2021   Procedure: Susa Day;  Surgeon: Lynann Bologna, MD;  Location: [facility] ENDOSCOPY;  Service: Endoscopy;;   Social History:   reports that he has been smoking cigarettes. He has a 21.5 pack-year smoking history. He has never been exposed to tobacco smoke. He has never used smokeless tobacco. He reports current alcohol use of about 12.0 standard drinks of alcohol per week. He reports that he does not currently use drugs after having used the following drugs: Cocaine.  Family History  Problem Relation Age of Onset   Heart failure Father    Hypertension Father    Diabetes Brother    Heart attack Brother    Alzheimer's disease Mother    Stroke Sister    Diabetes Sister    Alzheimer's disease Sister    Hypertension Brother    Diabetes Brother    Drug abuse Brother    Colon cancer Neg Hx     Medications: Patient's Medications  New Prescriptions   No medications on file  Previous Medications   ACETAMINOPHEN (TYLENOL) 650 MG  CR TABLET    Take 1,300 mg by mouth 3 (three) times daily.   ALBUTEROL (PROVENTIL) (2.5 MG/3ML) 0.083% NEBULIZER SOLUTION    Take 3 mLs (2.5 mg total) by nebulization every 6 (six) hours as needed for wheezing or shortness of breath.   ALLOPURINOL (ZYLOPRIM) 100 MG TABLET    Take one tablet by mouth once daily.   ATORVASTATIN (LIPITOR) 10 MG TABLET  TAKE 1 TABLET(10 MG) BY MOUTH DAILY. Please call [phone] to schedule an overdue appointment for future refills. Thank you. 2nd attempt.   B-D UF III MINI PEN NEEDLES 31G X 5 MM MISC    USE 4 TIMES DAILY   BIKTARVY 50-200-25 MG TABS TABLET    TAKE 1 TABLET BY MOUTH 1 TIME A DAY   CALCIUM ACETATE 667 MG TABS    Take 667-2,001 mg by mouth See admin instructions. Take 2001 mg by mouth three times a day with meals and 667 mg with each snack   CARVEDILOL (COREG) 3.125 MG TABLET    TAKE 1 TABLET(3.125 MG) BY MOUTH TWICE DAILY WITH A MEAL   DICLOFENAC SODIUM (VOLTAREN) 1 % GEL    Apply 2 grams topically to the affected area three times daily as needed for pain.   FOLIC ACID (FOLVITE) 1 MG TABLET    TAKE 1 TABLET(1 MG) BY MOUTH DAILY   GABAPENTIN (NEURONTIN) 300 MG CAPSULE    TAKE 1 CAPSULE BY MOUTH DAILY.   GENTAMICIN OINTMENT (GARAMYCIN) 0.1 %    Apply 1 Application topically daily.   GLUCOSE BLOOD (ONETOUCH ULTRA) TEST STRIP    Check blood sugar three times daily E11.40   INSULIN LISPRO (HUMALOG KWIKPEN [place])    Inject 20 Units into the skin 3 (three) times daily as needed (if blood sugar 150 or greater).   ISOSORBIDE MONONITRATE (IMDUR) 30 MG 24 HR TABLET    Take 1 tablet (30 mg total) by mouth daily.   LANCETS (ONETOUCH DELICA PLUS LANCET33G) MISC    USE 3 TIMES DAILY   LATANOPROST (XALATAN) 0.005 % OPHTHALMIC SOLUTION    Place 1 drop into both eyes at bedtime.   MENTHOL-CAMPHOR (TIGER BALM ARTHRITIS RUB) 11-11 % CREA    Apply 1 Application topically as needed (pain).   MIDODRINE (PROAMATINE) 10 MG TABLET    Take 10 mg by mouth 3 (three) times a week.    MULTIVITAMIN (RENA-VIT) TABS TABLET    Take 1 tablet by mouth daily.   OCTREOTIDE (SANDOSTATIN LAR DEPOT) 10 MG INJECTION    Inject 10 mg into the muscle every 28 days.   OXYCODONE-ACETAMINOPHEN (PERCOCET) 5-325 MG TABLET    Take 1 tablet by mouth every 8 (eight) hours as needed for severe pain (pain score 7-10).   PREVIDENT 5000 PLUS 1.1 % CREA DENTAL CREAM    Place 1 Application onto teeth.   TIMOLOL (TIMOPTIC) 0.5 % OPHTHALMIC SOLUTION    Place 1 drop into both eyes in the morning.   UMECLIDINIUM-VILANTEROL (ANORO ELLIPTA) 62.5-25 MCG/ACT AEPB    Inhale 1 puff into the lungs daily.  Modified Medications   No medications on file  Discontinued Medications   No medications on file    Physical Exam: Vitals:   09/20/23 0904  BP: 98/80  Pulse: 91  Resp: 16  Temp: (!) 97.5 F (36.4 C)  SpO2: 92%  Weight: 196 lb (88.9 kg)  Height: 6\' 2"  (1.88 m)   Body mass index is 25.16 kg/m. BP Readings from Last 3 Encounters:  09/20/23 98/80  08/24/23 (!) 98/56  08/22/23 112/70   Wt Readings from Last 3 Encounters:  09/20/23 196 lb (88.9 kg)  08/22/23 194 lb (88 kg)  06/30/23 199 lb (90.3 kg)    Physical Exam Constitutional:      Appearance: Normal appearance.  HENT:     Head: Normocephalic and atraumatic.  Cardiovascular:     Rate and Rhythm: Normal rate and regular  rhythm.     Heart sounds: Normal heart sounds.  Pulmonary:     Effort: No respiratory distress.     Breath sounds: No stridor. No wheezing or rales.  Abdominal:     General: Bowel sounds are normal. There is no distension.     Palpations: Abdomen is soft.     Tenderness: There is no abdominal tenderness. There is no right CVA tenderness or guarding.  Musculoskeletal:        General: No swelling.     Comments: Rt arm fistula   Neurological:     Mental Status: He is alert. Mental status is at baseline.     Sensory: No sensory deficit.     Motor: No weakness.     Labs reviewed: Basic Metabolic Panel: Recent  Labs    06/15/23 0401 06/16/23 0735 06/21/23 0134  NA 132* 132* 135  K 5.0 5.5* 5.0  CL 90* 92* 89*  CO2 26 24 26   GLUCOSE 137* 128* 230*  BUN 33* 60* 65*  CREATININE 6.71* 9.31* 8.91*  CALCIUM 8.6* 9.1 9.6  PHOS 4.2 5.7*  --    Liver Function Tests: Recent Labs    09/30/22 0259 02/02/23 0000 06/15/23 0401 06/16/23 0735  AST 20  --   --   --   ALT 11  --   --   --   ALKPHOS 63 103  --   --   BILITOT 0.6  --   --   --   PROT 7.2  --   --   --   ALBUMIN 3.1* 4.1 2.8* 3.1*   No results for input(s): "LIPASE", "AMYLASE" in the last 8760 hours. No results for input(s): "AMMONIA" in the last 8760 hours. CBC: Recent Labs    09/30/22 0259 10/01/22 0444 02/02/23 0000 06/14/23 0624 06/15/23 0401 06/16/23 0933 06/21/23 0134  WBC 5.6   < > 5.6 4.9 4.7 4.5 5.2  NEUTROABS 3.4  --  49.70 3.0  --   --   --   HGB 10.0*   < > 12.5* 10.3* 10.1* 9.9* 9.8*  HCT 31.4*   < > 37* 32.1* 31.5* 30.6* 31.4*  MCV 107.5*   < >  --  107.0* 106.1* 107.4* 107.2*  PLT 112*   < > 146* 150 130* 131* 157   < > = values in this interval not displayed.   Lipid Panel: No results for input(s): "CHOL", "HDL", "LDLCALC", "TRIG", "CHOLHDL", "LDLDIRECT" in the last 8760 hours. TSH: No results for input(s): "TSH" in the last 8760 hours. A1C: Lab Results  Component Value Date   HGBA1C 6.8 (H) 06/15/2023     Assessment/Plan 1. High risk medication use  - Drug Tox Monitor 1 w/Conf, Oral Fld  2. Acute cough Lungs clear  No wheezing  Pt does not appear to be in distress Able to speak in full sentences Instructed patient to cut down on smoking  Will send zpak  Will send tessalon  - azithromycin (ZITHROMAX) 250 MG tablet; Take 2 tablets on day 1, then 1 tablet daily on days 2 through 5  Dispense: 6 tablet; Refill: 0 - benzonatate (TESSALON) 200 MG capsule; Take 1 capsule (200 mg total) by mouth 2 (two) times daily as needed for cough.  Dispense: 20 capsule; Refill: 0  3. Primary osteoarthritis  involving multiple joints Pt reports difficulty walking for too long with a cane and requesting to use walker  Dme signed and instructed nurse to fax the form to adapt -  For home use only DME 4 wheeled rolling walker with seat ([id number])  4. Chronic combined systolic (congestive) and diastolic (congestive) heart failure (HCC) Lungs clear  Pt did not want to unwrap the lower extremity dressing   5. AVM (arteriovenous malformation) Follow up with GI  6. Chronic obstructive pulmonary disease, unspecified COPD type (HCC) Counseled on smoking cessation  7. Ulcer of right foot, unspecified ulcer stage (HCC) Will check with [facility] nurse and instruct them to notify about his ulcers   Lung cancer screening  Ordered low dose CT scan    A total of  42 minutes were spent face-to-face with the patient during this encounter and over half of that time was spent on counseling and coordination of care.   No follow-ups on file.:  3 months   Jacob [name]

## 2023-09-22 DIAGNOSIS — D509 Iron deficiency anemia, unspecified: Secondary | ICD-10-CM | POA: Diagnosis not present

## 2023-09-22 DIAGNOSIS — N2581 Secondary hyperparathyroidism of renal origin: Secondary | ICD-10-CM | POA: Diagnosis not present

## 2023-09-22 DIAGNOSIS — D631 Anemia in chronic kidney disease: Secondary | ICD-10-CM | POA: Diagnosis not present

## 2023-09-22 DIAGNOSIS — N186 End stage renal disease: Secondary | ICD-10-CM | POA: Diagnosis not present

## 2023-09-22 DIAGNOSIS — Z992 Dependence on renal dialysis: Secondary | ICD-10-CM | POA: Diagnosis not present

## 2023-09-23 DIAGNOSIS — I129 Hypertensive chronic kidney disease with stage 1 through stage 4 chronic kidney disease, or unspecified chronic kidney disease: Secondary | ICD-10-CM | POA: Diagnosis not present

## 2023-09-23 DIAGNOSIS — Z992 Dependence on renal dialysis: Secondary | ICD-10-CM | POA: Diagnosis not present

## 2023-09-23 DIAGNOSIS — N186 End stage renal disease: Secondary | ICD-10-CM | POA: Diagnosis not present

## 2023-09-24 LAB — DRUG TOX MONITOR 1 W/CONF, ORAL FLD
Amphetamines: NEGATIVE ng/mL (ref <10)
Barbiturates: NEGATIVE ng/mL (ref <10)
Benzodiazepines: NEGATIVE ng/mL (ref <0.50)
Buprenorphine: NEGATIVE ng/mL (ref <0.10)
Cocaine: NEGATIVE ng/mL (ref <5.0)
Codeine: NEGATIVE ng/mL (ref <2.5)
Cotinine: 146.8 ng/mL — ABNORMAL HIGH (ref <5.0)
Dihydrocodeine: NEGATIVE ng/mL (ref <2.5)
Fentanyl: NEGATIVE ng/mL (ref <0.10)
Heroin Metabolite: NEGATIVE ng/mL (ref <1.0)
Hydrocodone: NEGATIVE ng/mL (ref <2.5)
Hydromorphone: NEGATIVE ng/mL (ref <2.5)
MARIJUANA: NEGATIVE ng/mL (ref <2.5)
MDMA: NEGATIVE ng/mL (ref <10)
Meprobamate: NEGATIVE ng/mL (ref <2.5)
Methadone: NEGATIVE ng/mL (ref <5.0)
Morphine: NEGATIVE ng/mL (ref <2.5)
Nicotine Metabolite: POSITIVE ng/mL — AB (ref <5.0)
Norhydrocodone: NEGATIVE ng/mL (ref <2.5)
Noroxycodone: 23.3 ng/mL — ABNORMAL HIGH (ref <2.5)
Opiates: POSITIVE ng/mL — AB (ref <2.5)
Oxycodone: >250 ng/mL — ABNORMAL HIGH (ref <2.5)
Oxymorphone: 7 ng/mL — ABNORMAL HIGH (ref <2.5)
Phencyclidine: NEGATIVE ng/mL (ref <10)
Tapentadol: NEGATIVE ng/mL (ref <5.0)
Tramadol: NEGATIVE ng/mL (ref <5.0)
Zolpidem: NEGATIVE ng/mL (ref <5.0)

## 2023-09-25 ENCOUNTER — Other Ambulatory Visit (HOSPITAL_COMMUNITY): Payer: Self-pay

## 2023-09-25 ENCOUNTER — Other Ambulatory Visit: Payer: Self-pay

## 2023-09-25 DIAGNOSIS — Z992 Dependence on renal dialysis: Secondary | ICD-10-CM | POA: Diagnosis not present

## 2023-09-25 DIAGNOSIS — N186 End stage renal disease: Secondary | ICD-10-CM | POA: Diagnosis not present

## 2023-09-25 DIAGNOSIS — N2581 Secondary hyperparathyroidism of renal origin: Secondary | ICD-10-CM | POA: Diagnosis not present

## 2023-09-25 DIAGNOSIS — D631 Anemia in chronic kidney disease: Secondary | ICD-10-CM | POA: Diagnosis not present

## 2023-09-26 ENCOUNTER — Other Ambulatory Visit: Payer: Self-pay

## 2023-09-26 ENCOUNTER — Ambulatory Visit (HOSPITAL_COMMUNITY)
Admission: RE | Admit: 2023-09-26 | Discharge: 2023-09-26 | Disposition: A | Discharge status: Home / Self Care | Payer: 59 | Source: Ambulatory Visit | Attending: Gastroenterology | Admitting: Gastroenterology

## 2023-09-26 ENCOUNTER — Ambulatory Visit: Payer: Self-pay

## 2023-09-26 ENCOUNTER — Other Ambulatory Visit (HOSPITAL_COMMUNITY): Payer: Self-pay

## 2023-09-26 ENCOUNTER — Telehealth: Payer: Self-pay

## 2023-09-26 DIAGNOSIS — L97321 Non-pressure chronic ulcer of left ankle limited to breakdown of skin: Secondary | ICD-10-CM | POA: Diagnosis not present

## 2023-09-26 DIAGNOSIS — I5042 Chronic combined systolic (congestive) and diastolic (congestive) heart failure: Secondary | ICD-10-CM | POA: Diagnosis not present

## 2023-09-26 DIAGNOSIS — I87313 Chronic venous hypertension (idiopathic) with ulcer of bilateral lower extremity: Secondary | ICD-10-CM | POA: Diagnosis not present

## 2023-09-26 DIAGNOSIS — E1122 Type 2 diabetes mellitus with diabetic chronic kidney disease: Secondary | ICD-10-CM | POA: Diagnosis not present

## 2023-09-26 DIAGNOSIS — D649 Anemia, unspecified: Secondary | ICD-10-CM | POA: Diagnosis not present

## 2023-09-26 DIAGNOSIS — L97311 Non-pressure chronic ulcer of right ankle limited to breakdown of skin: Secondary | ICD-10-CM | POA: Diagnosis not present

## 2023-09-26 DIAGNOSIS — Q2730 Arteriovenous malformation, site unspecified: Secondary | ICD-10-CM | POA: Insufficient documentation

## 2023-09-26 DIAGNOSIS — I132 Hypertensive heart and chronic kidney disease with heart failure and with stage 5 chronic kidney disease, or end stage renal disease: Secondary | ICD-10-CM | POA: Diagnosis not present

## 2023-09-26 MED ORDER — OCTREOTIDE ACETATE 10 MG IM KIT
10.0000 mg | PACK | INTRAMUSCULAR | Status: DC
  Administered 2023-09-26: 10 mg via INTRAMUSCULAR
  Filled 2023-09-26: qty 1

## 2023-09-26 NOTE — Telephone Encounter (Addendum)
Perlie Gold from [facility] is calling to check on Prior Authorization for a Walker.  I just added the order to [facility] health for patient. Just waiting on the decision for the walker

## 2023-09-26 NOTE — Patient Outreach (Signed)
  Care Coordination   09/26/2023 Name: [name] MRN: [id number] DOB: 1951-03-10   Care Coordination Outreach Attempts:  An unsuccessful telephone outreach was attempted for a scheduled appointment today.  Follow Up Plan:  Additional outreach attempts will be made to offer the patient care coordination information and services.   Encounter Outcome:  No Answer   Care Coordination Interventions:  No, not indicated    Delsa Sale RN BSN CCM [place]  [facility], [facility] Nurse Care Coordinator  Direct Dial: [phone] Website: [email or web address]

## 2023-09-26 NOTE — Telephone Encounter (Signed)
Signed the order.

## 2023-09-27 DIAGNOSIS — D631 Anemia in chronic kidney disease: Secondary | ICD-10-CM | POA: Diagnosis not present

## 2023-09-27 DIAGNOSIS — N2581 Secondary hyperparathyroidism of renal origin: Secondary | ICD-10-CM | POA: Diagnosis not present

## 2023-09-27 DIAGNOSIS — N186 End stage renal disease: Secondary | ICD-10-CM | POA: Diagnosis not present

## 2023-09-27 DIAGNOSIS — Z992 Dependence on renal dialysis: Secondary | ICD-10-CM | POA: Diagnosis not present

## 2023-09-28 ENCOUNTER — Ambulatory Visit
Admission: RE | Admit: 2023-09-28 | Discharge: 2023-09-28 | Disposition: A | Discharge status: Home / Self Care | Payer: 59 | Source: Ambulatory Visit | Attending: Sports Medicine | Admitting: Sports Medicine

## 2023-09-28 DIAGNOSIS — F17210 Nicotine dependence, cigarettes, uncomplicated: Secondary | ICD-10-CM | POA: Diagnosis not present

## 2023-09-28 DIAGNOSIS — Z122 Encounter for screening for malignant neoplasm of respiratory organs: Secondary | ICD-10-CM

## 2023-09-29 DIAGNOSIS — N186 End stage renal disease: Secondary | ICD-10-CM | POA: Diagnosis not present

## 2023-09-29 DIAGNOSIS — D631 Anemia in chronic kidney disease: Secondary | ICD-10-CM | POA: Diagnosis not present

## 2023-09-29 DIAGNOSIS — N2581 Secondary hyperparathyroidism of renal origin: Secondary | ICD-10-CM | POA: Diagnosis not present

## 2023-09-29 DIAGNOSIS — Z992 Dependence on renal dialysis: Secondary | ICD-10-CM | POA: Diagnosis not present

## 2023-10-01 ENCOUNTER — Emergency Department (HOSPITAL_COMMUNITY)
Admission: EM | Admit: 2023-10-01 | Discharge: 2023-10-02 | Disposition: A | Discharge status: Home / Self Care | Payer: 59 | Attending: Emergency Medicine | Admitting: Emergency Medicine

## 2023-10-01 ENCOUNTER — Other Ambulatory Visit: Payer: Self-pay

## 2023-10-01 ENCOUNTER — Emergency Department (HOSPITAL_COMMUNITY): Payer: 59

## 2023-10-01 ENCOUNTER — Encounter (HOSPITAL_COMMUNITY): Payer: Self-pay

## 2023-10-01 DIAGNOSIS — K5900 Constipation, unspecified: Secondary | ICD-10-CM | POA: Diagnosis not present

## 2023-10-01 DIAGNOSIS — I517 Cardiomegaly: Secondary | ICD-10-CM | POA: Diagnosis not present

## 2023-10-01 DIAGNOSIS — Z20822 Contact with and (suspected) exposure to covid-19: Secondary | ICD-10-CM | POA: Insufficient documentation

## 2023-10-01 DIAGNOSIS — I4510 Unspecified right bundle-branch block: Secondary | ICD-10-CM | POA: Diagnosis not present

## 2023-10-01 DIAGNOSIS — N186 End stage renal disease: Secondary | ICD-10-CM | POA: Insufficient documentation

## 2023-10-01 DIAGNOSIS — J449 Chronic obstructive pulmonary disease, unspecified: Secondary | ICD-10-CM | POA: Insufficient documentation

## 2023-10-01 DIAGNOSIS — I440 Atrioventricular block, first degree: Secondary | ICD-10-CM | POA: Diagnosis not present

## 2023-10-01 DIAGNOSIS — I509 Heart failure, unspecified: Secondary | ICD-10-CM | POA: Diagnosis not present

## 2023-10-01 DIAGNOSIS — Z21 Asymptomatic human immunodeficiency virus [HIV] infection status: Secondary | ICD-10-CM | POA: Insufficient documentation

## 2023-10-01 DIAGNOSIS — K5730 Diverticulosis of large intestine without perforation or abscess without bleeding: Secondary | ICD-10-CM | POA: Diagnosis not present

## 2023-10-01 DIAGNOSIS — I132 Hypertensive heart and chronic kidney disease with heart failure and with stage 5 chronic kidney disease, or end stage renal disease: Secondary | ICD-10-CM | POA: Diagnosis not present

## 2023-10-01 DIAGNOSIS — I2510 Atherosclerotic heart disease of native coronary artery without angina pectoris: Secondary | ICD-10-CM | POA: Insufficient documentation

## 2023-10-01 DIAGNOSIS — K4090 Unilateral inguinal hernia, without obstruction or gangrene, not specified as recurrent: Secondary | ICD-10-CM | POA: Diagnosis not present

## 2023-10-01 DIAGNOSIS — N200 Calculus of kidney: Secondary | ICD-10-CM | POA: Diagnosis not present

## 2023-10-01 DIAGNOSIS — E875 Hyperkalemia: Secondary | ICD-10-CM | POA: Diagnosis not present

## 2023-10-01 DIAGNOSIS — Z79899 Other long term (current) drug therapy: Secondary | ICD-10-CM | POA: Insufficient documentation

## 2023-10-01 DIAGNOSIS — R0602 Shortness of breath: Secondary | ICD-10-CM | POA: Insufficient documentation

## 2023-10-01 DIAGNOSIS — I4430 Unspecified atrioventricular block: Secondary | ICD-10-CM | POA: Diagnosis not present

## 2023-10-01 DIAGNOSIS — R609 Edema, unspecified: Secondary | ICD-10-CM | POA: Diagnosis not present

## 2023-10-01 DIAGNOSIS — E1122 Type 2 diabetes mellitus with diabetic chronic kidney disease: Secondary | ICD-10-CM | POA: Diagnosis not present

## 2023-10-01 DIAGNOSIS — Z7984 Long term (current) use of oral hypoglycemic drugs: Secondary | ICD-10-CM | POA: Insufficient documentation

## 2023-10-01 DIAGNOSIS — J439 Emphysema, unspecified: Secondary | ICD-10-CM | POA: Diagnosis not present

## 2023-10-01 DIAGNOSIS — Z794 Long term (current) use of insulin: Secondary | ICD-10-CM | POA: Diagnosis not present

## 2023-10-01 LAB — COMPREHENSIVE METABOLIC PANEL
ALT: 16 U/L (ref 0–44)
AST: 34 U/L (ref 15–41)
Albumin: 3.2 g/dL — ABNORMAL LOW (ref 3.5–5.0)
Alkaline Phosphatase: 98 U/L (ref 38–126)
Anion gap: 14 (ref 5–15)
BUN: 47 mg/dL — ABNORMAL HIGH (ref 8–23)
CO2: 26 mmol/L (ref 22–32)
Calcium: 9.6 mg/dL (ref 8.9–10.3)
Chloride: 96 mmol/L — ABNORMAL LOW (ref 98–111)
Creatinine, Ser: 9.97 mg/dL — ABNORMAL HIGH (ref 0.61–1.24)
GFR, Estimated: 5 mL/min — ABNORMAL LOW (ref >60)
Glucose, Bld: 189 mg/dL — ABNORMAL HIGH (ref 70–99)
Potassium: 5.7 mmol/L — ABNORMAL HIGH (ref 3.5–5.1)
Sodium: 136 mmol/L (ref 135–145)
Total Bilirubin: 0.8 mg/dL (ref <1.2)
Total Protein: 7.9 g/dL (ref 6.5–8.1)

## 2023-10-01 LAB — CBC WITH DIFFERENTIAL/PLATELET
Abs Immature Granulocytes: 0.01 10*3/uL (ref 0.00–0.07)
Basophils Absolute: 0.1 10*3/uL (ref 0.0–0.1)
Basophils Relative: 1 %
Eosinophils Absolute: 0.2 10*3/uL (ref 0.0–0.5)
Eosinophils Relative: 4 %
HCT: 34.3 % — ABNORMAL LOW (ref 39.0–52.0)
Hemoglobin: 11 g/dL — ABNORMAL LOW (ref 13.0–17.0)
Immature Granulocytes: 0 %
Lymphocytes Relative: 27 %
Lymphs Abs: 1.6 10*3/uL (ref 0.7–4.0)
MCH: 34.8 pg — ABNORMAL HIGH (ref 26.0–34.0)
MCHC: 32.1 g/dL (ref 30.0–36.0)
MCV: 108.5 fL — ABNORMAL HIGH (ref 80.0–100.0)
Monocytes Absolute: 0.8 10*3/uL (ref 0.1–1.0)
Monocytes Relative: 13 %
Neutro Abs: 3.1 10*3/uL (ref 1.7–7.7)
Neutrophils Relative %: 55 %
Platelets: 126 10*3/uL — ABNORMAL LOW (ref 150–400)
RBC: 3.16 MIL/uL — ABNORMAL LOW (ref 4.22–5.81)
RDW: 14.8 % (ref 11.5–15.5)
WBC: 5.7 10*3/uL (ref 4.0–10.5)
nRBC: 0 % (ref 0.0–0.2)

## 2023-10-01 LAB — I-STAT VENOUS BLOOD GAS, ED
Acid-Base Excess: 7 mmol/L — ABNORMAL HIGH (ref 0.0–2.0)
Bicarbonate: 31.8 mmol/L — ABNORMAL HIGH (ref 20.0–28.0)
Calcium, Ion: 1.12 mmol/L — ABNORMAL LOW (ref 1.15–1.40)
HCT: 34 % — ABNORMAL LOW (ref 39.0–52.0)
Hemoglobin: 11.6 g/dL — ABNORMAL LOW (ref 13.0–17.0)
O2 Saturation: 100 %
Potassium: 5.5 mmol/L — ABNORMAL HIGH (ref 3.5–5.1)
Sodium: 135 mmol/L (ref 135–145)
TCO2: 33 mmol/L — ABNORMAL HIGH (ref 22–32)
pCO2, Ven: 45 mm[Hg] (ref 44–60)
pH, Ven: 7.458 — ABNORMAL HIGH (ref 7.25–7.43)
pO2, Ven: 166 mm[Hg] — ABNORMAL HIGH (ref 32–45)

## 2023-10-01 LAB — BRAIN NATRIURETIC PEPTIDE: B Natriuretic Peptide: 1282.5 pg/mL — ABNORMAL HIGH (ref 0.0–100.0)

## 2023-10-01 LAB — CBG MONITORING, ED
Glucose-Capillary: 146 mg/dL — ABNORMAL HIGH (ref 70–99)
Glucose-Capillary: 206 mg/dL — ABNORMAL HIGH (ref 70–99)

## 2023-10-01 LAB — TROPONIN I (HIGH SENSITIVITY)
Troponin I (High Sensitivity): 72 ng/L — ABNORMAL HIGH (ref <18)
Troponin I (High Sensitivity): 68 ng/L — ABNORMAL HIGH (ref <18)

## 2023-10-01 LAB — RESP PANEL BY RT-PCR (RSV, FLU A&B, COVID)  RVPGX2
Influenza A by PCR: NEGATIVE
Influenza B by PCR: NEGATIVE
Resp Syncytial Virus by PCR: NEGATIVE
SARS Coronavirus 2 by RT PCR: NEGATIVE

## 2023-10-01 LAB — MAGNESIUM: Magnesium: 2.6 mg/dL — ABNORMAL HIGH (ref 1.7–2.4)

## 2023-10-01 MED ORDER — SODIUM ZIRCONIUM CYCLOSILICATE 10 G PO PACK
10.0000 g | PACK | Freq: Once | ORAL | Status: AC
  Administered 2023-10-01: 10 g via ORAL
  Filled 2023-10-01: qty 1

## 2023-10-01 MED ORDER — INSULIN ASPART 100 UNIT/ML IV SOLN
5.0000 [IU] | Freq: Once | INTRAVENOUS | Status: AC
Start: 2023-10-01 — End: 2023-10-01
  Administered 2023-10-01: 5 [IU] via INTRAVENOUS

## 2023-10-01 MED ORDER — ALBUTEROL SULFATE (2.5 MG/3ML) 0.083% IN NEBU
2.5000 mg | INHALATION_SOLUTION | Freq: Once | RESPIRATORY_TRACT | Status: DC
  Filled 2023-10-01: qty 3

## 2023-10-01 MED ORDER — DEXTROSE 50 % IV SOLN
1.0000 | Freq: Once | INTRAVENOUS | Status: AC
  Administered 2023-10-01: 50 mL via INTRAVENOUS
  Filled 2023-10-01: qty 50

## 2023-10-01 MED ORDER — CHLORHEXIDINE GLUCONATE CLOTH 2 % EX PADS: 6.0000 | MEDICATED_PAD | Freq: Every day | CUTANEOUS | Status: DC

## 2023-10-01 MED ORDER — ALUMINUM-PETROLATUM-ZINC (1-2-3 PASTE) 0.027-13.7-10% PASTE
1.0000 | PASTE | Freq: Once | CUTANEOUS | Status: DC
  Filled 2023-10-01: qty 120

## 2023-10-01 NOTE — Progress Notes (Signed)
Called by Ed that pt presented with SOB for past 3 days.  Completed full HD treatment on 09/29/23 and go to edw of 87kg.  K 5.7.  CT scan and CXR negative.  Will plan for HD tonight and then send back to ed to evaluate for possible discharge.  His edw may need to be challenged as an outpatient.

## 2023-10-01 NOTE — ED Provider Notes (Signed)
[place] EMERGENCY DEPARTMENT AT [facility] Provider Note   CSN: [id number] Arrival date & time: 10/01/23  1736     History  Chief Complaint  Patient presents with   Shortness of Breath    Jacob [name] is a 72 y.o. male.  HPI Patient presents for shortness of breath.  Medical history includes HIV, HTN, HLD, GERD, ESRD, CAD, COPD, CHF, anemia, arthritis, DM, substance abuse.  He was treated last week with a Z-Pak.  He is unsure if this was for pneumonia or COPD exacerbation.  He underwent dialysis on Friday.  He thinks that they may not have taken off much fluid.  He has had some abdominal and lower extremity swelling.  Today, early this afternoon, he had onset of worsening shortness of breath.  He denies any associated chest or back pain.  He feels that his abdomen is distended and is worried about constipation.  He did have a bowel movement today at noon but it was small.  He has been taking MiraLAX at home.  He does not have abdominal pain.    Home Medications Prior to Admission medications   Medication Sig Start Date End Date Taking? Authorizing Provider  acetaminophen (TYLENOL) 650 MG CR tablet Take 1,300 mg by mouth 3 (three) times daily.    [provider]  albuterol (PROVENTIL) (2.5 MG/3ML) 0.083% nebulizer solution Take 3 mLs (2.5 mg total) by nebulization every 6 (six) hours as needed for wheezing or shortness of breath. 06/20/23 06/19/24  [name], Dinah C, NP  allopurinol (ZYLOPRIM) 100 MG tablet Take one tablet by mouth once daily. 03/02/23   [name], MD  atorvastatin (LIPITOR) 10 MG tablet TAKE 1 TABLET(10 MG) BY MOUTH DAILY. Please call [phone] to schedule an overdue appointment for future refills. Thank you. 2nd attempt. 09/13/23   [name], MD  B-D UF III MINI PEN NEEDLES 31G X 5 MM MISC USE 4 TIMES DAILY 07/13/23   [name], MD  benzonatate (TESSALON) 200 MG capsule Take 1 capsule (200 mg total) by mouth 2 (two) times  daily as needed for cough. 09/20/23   [name], MD  BIKTARVY 50-200-25 MG TABS tablet TAKE 1 TABLET BY MOUTH 1 TIME A DAY 09/12/23   Gardiner Barefoot, MD  Calcium Acetate 667 MG TABS Take 667-2,001 mg by mouth See admin instructions. Take 2001 mg by mouth three times a day with meals and 667 mg with each snack 01/20/21   [provider]  carvedilol (COREG) 3.125 MG tablet TAKE 1 TABLET(3.125 MG) BY MOUTH TWICE DAILY WITH A MEAL 07/26/23   [name], MD  diclofenac Sodium (VOLTAREN) 1 % GEL Apply 2 grams topically to the affected area three times daily as needed for pain. 12/19/22   [name], MD  folic acid (FOLVITE) 1 MG tablet TAKE 1 TABLET(1 MG) BY MOUTH DAILY 02/06/23   [name], MD  gabapentin (NEURONTIN) 300 MG capsule TAKE 1 CAPSULE BY MOUTH DAILY. 05/11/22   [name], MD  gentamicin ointment (GARAMYCIN) 0.1 % Apply 1 Application topically daily. 01/06/23   Standiford, Jenelle Mages, DPM  glucose blood (ONETOUCH ULTRA) test strip Check blood sugar three times daily E11.40 05/18/23   [name], MD  Insulin Lispro (HUMALOG KWIKPEN [place]) Inject 20 Units into the skin 3 (three) times daily as needed (if blood sugar 150 or greater).    [provider]  isosorbide mononitrate (IMDUR) 30 MG 24 hr tablet  Take 1 tablet (30 mg total) by mouth daily. 06/05/23   [name], MD  Lancets [facility] DELICA PLUS [place]) MISC USE 3 TIMES DAILY 08/15/22   [name], MD  latanoprost (XALATAN) 0.005 % ophthalmic solution Place 1 drop into both eyes at bedtime.    [provider]  Menthol-Camphor (TIGER BALM ARTHRITIS RUB) 11-11 % CREA Apply 1 Application topically as needed (pain).    [provider]  midodrine (PROAMATINE) 10 MG tablet Take 10 mg by mouth 3 (three) times a week. 03/21/23   [provider]  multivitamin (RENA-VIT) TABS tablet Take 1 tablet by mouth daily.    [provider]  octreotide  (SANDOSTATIN LAR DEPOT) 10 MG injection Inject 10 mg into the muscle every 28 days. 08/22/23   Hilarie Fredrickson, MD  oxyCODONE-acetaminophen (PERCOCET) 5-325 MG tablet Take 1 tablet by mouth every 8 (eight) hours as needed for severe pain (pain score 7-10). 09/08/23   [name], MD  PREVIDENT 5000 PLUS 1.1 % CREA dental cream Place 1 Application onto teeth. 05/25/23   [provider]  timolol (TIMOPTIC) 0.5 % ophthalmic solution Place 1 drop into both eyes in the morning. 04/04/19   [provider]  umeclidinium-vilanterol (ANORO ELLIPTA) 62.5-25 MCG/ACT AEPB Inhale 1 puff into the lungs daily. 06/07/23   [name], MD      Allergies    Augmentin [amoxicillin-pot clavulanate], Morphine sulfate, Mucinex fast-max, and Amphetamines    Review of Systems   Review of Systems  Respiratory:  Positive for shortness of breath.   Gastrointestinal:  Positive for abdominal distention and constipation.  All other systems reviewed and are negative.   Physical Exam Updated Vital Signs BP (!) 140/79 (BP Location: Left Arm)   Pulse 86   Temp 98 F (36.7 C) (Oral)   Resp (!) 22   Ht 6\' 2"  (1.88 m)   Wt 79.4 kg   SpO2 100%   BMI 22.47 kg/m  Physical Exam Vitals and nursing note reviewed.  Constitutional:      General: He is not in acute distress.    Appearance: Normal appearance. He is well-developed. He is not ill-appearing, toxic-appearing or diaphoretic.  HENT:     Head: Normocephalic and atraumatic.     Right Ear: External ear normal.     Left Ear: External ear normal.     Nose: Nose normal.     Mouth/Throat:     Mouth: Mucous membranes are moist.  Eyes:     Extraocular Movements: Extraocular movements intact.     Conjunctiva/sclera: Conjunctivae normal.  Cardiovascular:     Rate and Rhythm: Normal rate and regular rhythm.     Heart sounds: No murmur heard. Pulmonary:     Effort: Pulmonary effort is normal. No respiratory distress.     Breath sounds:  Rales present. No wheezing.  Abdominal:     General: There is distension.     Palpations: Abdomen is soft.     Tenderness: There is no abdominal tenderness.  Musculoskeletal:        General: No swelling. Normal range of motion.     Cervical back: Normal range of motion and neck supple.     Right lower leg: Edema present.     Left lower leg: Edema present.  Skin:    General: Skin is warm and dry.     Coloration: Skin is not jaundiced.  Neurological:     General: No focal deficit present.  Mental Status: He is alert and oriented to person, place, and time.  Psychiatric:        Mood and Affect: Mood normal.        Behavior: Behavior normal.     ED Results / Procedures / Treatments   Labs (all labs ordered are listed, but only abnormal results are displayed) Labs Reviewed  COMPREHENSIVE METABOLIC PANEL - Abnormal; Notable for the following components:      Result Value   Potassium 5.7 (*)    Chloride 96 (*)    Glucose, Bld 189 (*)    BUN 47 (*)    Creatinine, Ser 9.97 (*)    Albumin 3.2 (*)    GFR, Estimated 5 (*)    All other components within normal limits  CBC WITH DIFFERENTIAL/PLATELET - Abnormal; Notable for the following components:   RBC 3.16 (*)    Hemoglobin 11.0 (*)    HCT 34.3 (*)    MCV 108.5 (*)    MCH 34.8 (*)    Platelets 126 (*)    All other components within normal limits  BRAIN NATRIURETIC PEPTIDE - Abnormal; Notable for the following components:   B Natriuretic Peptide 1,282.5 (*)    All other components within normal limits  MAGNESIUM - Abnormal; Notable for the following components:   Magnesium 2.6 (*)    All other components within normal limits  I-STAT VENOUS BLOOD GAS, ED - Abnormal; Notable for the following components:   pH, Ven 7.458 (*)    pO2, Ven 166 (*)    Bicarbonate 31.8 (*)    TCO2 33 (*)    Acid-Base Excess 7.0 (*)    Potassium 5.5 (*)    Calcium, Ion 1.12 (*)    HCT 34.0 (*)    Hemoglobin 11.6 (*)    All other components  within normal limits  TROPONIN I (HIGH SENSITIVITY) - Abnormal; Notable for the following components:   Troponin I (High Sensitivity) 72 (*)    All other components within normal limits  RESP PANEL BY RT-PCR (RSV, FLU A&B, COVID)  [id number]  HEPATITIS B SURFACE ANTIGEN  HEPATITIS B SURFACE ANTIBODY, QUANTITATIVE  TROPONIN I (HIGH SENSITIVITY)    EKG EKG Interpretation Date/Time:  Sunday October 01 2023 18:01:09 EST Ventricular Rate:  84 PR Interval:  219 QRS Duration:  148 QT Interval:  440 QTC Calculation: 521 R Axis:   -42  Text Interpretation: Sinus rhythm Borderline prolonged PR interval IVCD, consider atypical RBBB LVH with IVCD, LAD and secondary repol abnrm Anterolateral infarct, age indeterminate Prolonged QT interval Confirmed by Gloris Manchester [phone]) on 10/01/2023 6:03:06 PM  Radiology CT CHEST ABDOMEN PELVIS WO CONTRAST  Result Date: 10/01/2023 CLINICAL DATA:  Abdominal and lower extremity swelling with shortness of breath, initial encounter EXAM: CT CHEST, ABDOMEN AND PELVIS WITHOUT CONTRAST TECHNIQUE: Multidetector CT imaging of the chest, abdomen and pelvis was performed following the standard protocol without IV contrast. RADIATION DOSE REDUCTION: This exam was performed according to the departmental dose-optimization program which includes automated exposure control, adjustment of the mA and/or kV according to patient size and/or use of iterative reconstruction technique. COMPARISON:  09/28/2023, 09/30/2022 FINDINGS: CT CHEST FINDINGS Cardiovascular: Somewhat limited due to lack of IV contrast. Heavy coronary and aortic calcifications are seen. Changes of prior coronary bypass grafting are noted. Heart is enlarged in size. Pulmonary artery is not significantly enlarged. No pericardial effusion is seen. Mediastinum/Nodes: Esophagus as visualized is within normal limits. Thoracic inlet is unremarkable. No hilar or mediastinal  adenopathy is noted. Lungs/Pleura: Lungs are well aerated  bilaterally. Mild emphysematous changes are seen. No focal infiltrate or sizable effusion is noted. No findings to suggest parenchymal edema are noted. Some scarring in the lingula is seen. This is stable dating back to 2023. Musculoskeletal: No acute rib abnormality is noted. Degenerative changes of the thoracic spine are seen. CT ABDOMEN PELVIS FINDINGS Hepatobiliary: No focal liver abnormality is seen. Status post cholecystectomy. No biliary dilatation. Pancreas: Unremarkable. No pancreatic ductal dilatation or surrounding inflammatory changes. Spleen: Normal in size without focal abnormality. Adrenals/Urinary Tract: Adrenal glands are within normal limits. Kidneys are well visualized. A punctate nonobstructing right renal stone is noted similar to that seen on the prior exam. Heavy atherosclerotic calcifications of the renal vasculature is seen. Obstructive changes are seen. Bladder is decompressed. Stomach/Bowel: Scattered diverticular change of the colon is noted. No evidence of diverticulitis is seen. The appendix is within normal limits. No inflammatory changes are seen. Small bowel and stomach are unremarkable. Vascular/Lymphatic: Aortic atherosclerosis. No enlarged abdominal or pelvic lymph nodes. Reproductive: Prostate is unremarkable. Other: No free fluid is noted. Fat containing left inguinal hernia is again noted. No findings of anasarca are seen. Musculoskeletal: Postsurgical changes are noted in the proximal left femur. Degenerative changes of lumbar spine are noted. IMPRESSION: Chronic changes as described above. No acute abnormality to correspond with the given clinical history is noted. Aortic Atherosclerosis (ICD10-I70.0) and Emphysema (ICD10-J43.9). Electronically Signed   By: Alcide Clever M.D.   On: 10/01/2023 19:51   DG Chest Port 1 View  Result Date: 10/01/2023 CLINICAL DATA:  Short of breath for 3 days days since dialysis. EXAM: PORTABLE CHEST 1 VIEW COMPARISON:  06/13/2023.  CT,  09/28/2023. FINDINGS: Stable enlargement the cardiac silhouette. Stable changes from previous CABG surgery. No mediastinal or hilar masses. Bilateral interstitial prominence is similar to the prior exam. No lung consolidation. No pleural effusion or pneumothorax. Skeletal structures are grossly intact. IMPRESSION: 1. Cardiomegaly with bilateral interstitial prominence. Findings similar to the prior study. Cannot exclude a component of interstitial edema. No evidence of pneumonia. Electronically Signed   By: Amie Portland M.D.   On: 10/01/2023 18:35    Procedures Procedures    Medications Ordered in ED Medications  sodium zirconium cyclosilicate (LOKELMA) packet 10 g (has no administration in time range)  insulin aspart (novoLOG) injection 5 Units (has no administration in time range)    And  dextrose 50 % solution 50 mL (has no administration in time range)  albuterol (PROVENTIL) (2.5 MG/3ML) 0.083% nebulizer solution 2.5 mg (has no administration in time range)  Chlorhexidine Gluconate Cloth 2 % PADS 6 each (has no administration in time range)    ED Course/ Medical Decision Making/ A&P                                 Medical Decision Making Amount and/or Complexity of Data Reviewed Labs: ordered. Radiology: ordered.  Risk OTC drugs. Prescription drug management.   This patient presents to the ED for concern of shortness of breath, this involves an extensive number of treatment options, and is a complaint that carries with it a high risk of complications and morbidity.  The differential diagnosis includes volume overload, CHF exacerbation, COPD exacerbation, pneumonia, ACS, anemia, acidosis, other metabolic derangements   Co morbidities that complicate the patient evaluation  HIV, HTN, HLD, GERD, ESRD, CAD, COPD, CHF, anemia, arthritis, DM, substance abuse  Additional history obtained:  Additional history obtained from EMS External records from outside source obtained and  reviewed including EMR   Lab Tests:  I Ordered, and personally interpreted labs.  The pertinent results include: Baseline anemia, no leukocytosis, elevated creatinine consistent with ESRD, hyperkalemia is present.  BNP and troponin are moderately elevated consistent with prior lab work.   Imaging Studies ordered:  I ordered imaging studies including chest x-ray, CT of chest, abdomen, pelvis I independently visualized and interpreted imaging which showed cardiomegaly, emphysematous change, and interstitial prominence consistent with prior imaging I agree with the radiologist interpretation   Cardiac Monitoring: / EKG:  The patient was maintained on a cardiac monitor.  I personally viewed and interpreted the cardiac monitored which showed an underlying rhythm of: Sinus rhythm   Consultations Obtained:  I requested consultation with the nephrologist, Dr. Arrie Aran,  and discussed lab and imaging findings as well as pertinent plan - they recommend: Hemodialysis tonight.  Patient to return to the ED for reassessment afterwards.   Problem List / ED Course / Critical interventions / Medication management  Patient presenting for acute onset of shortness of breath this afternoon.  On arrival in the ED, he is overall well-appearing.  Current SpO2 is normal on room air.  He is able to speak in complete sentences.  He does have bibasilar crackles on lung auscultation.  His abdomen does seem distended.  He states that this is not baseline for him.  He does not have any abdominal tenderness.  Although he did have a small bowel movement today, he is worried about constipation.  Patient was placed on bedside cardiac monitor.  Workup was initiated.  Notable lab findings include hyperkalemia.  Elevations in BNP and troponin are consistent with prior lab work.  Lokelma, albuterol, and insulin/dextrose were ordered to temporize his hyperkalemia.  I spoke with nephrologist on-call, Dr. Arrie Aran, who will  arrange dialysis tonight.  Patient to return to the ED after dialysis for reassessment.  Patient is in favor of this rather than admission. I ordered medication including albuterol, dextrose/insulin, Lokelma for temporization of hyperkalemia Reevaluation of the patient after these medicines showed that the patient improved I have reviewed the patients home medicines and have made adjustments as needed   Social Determinants of Health:  Has access to outpatient care  CRITICAL CARE Performed by: Gloris Manchester   Total critical care time: 32 minutes  Critical care time was exclusive of separately billable procedures and treating other patients.  Critical care was necessary to treat or prevent imminent or life-threatening deterioration.  Critical care was time spent personally by me on the following activities: development of treatment plan with patient and/or surrogate as well as nursing, discussions with consultants, evaluation of patient's response to treatment, examination of patient, obtaining history from patient or surrogate, ordering and performing treatments and interventions, ordering and review of laboratory studies, ordering and review of radiographic studies, pulse oximetry and re-evaluation of patient's condition.         Final Clinical Impression(s) / ED Diagnoses Final diagnoses:  SOB (shortness of breath)  Hyperkalemia    Rx / DC Orders ED Discharge Orders     None         Gloris Manchester, MD 10/01/23 2104

## 2023-10-01 NOTE — ED Triage Notes (Signed)
Pt bib [facility] coming from home. Pt is MWF dialysis pt, received tx on Friday but states he doesn't feel like they took off enough. Bilateral swelling to lower extremities and abdomen. Endorses SOB, EMS reports rales in lower lobes. Pt does have hx of CHF. Pt alert and oriented x4.   EMS: 132/70 88 HR 100% RA 227 cbg  EKG - Sinus w/ first degree block

## 2023-10-02 DIAGNOSIS — E1122 Type 2 diabetes mellitus with diabetic chronic kidney disease: Secondary | ICD-10-CM | POA: Diagnosis not present

## 2023-10-02 DIAGNOSIS — I87313 Chronic venous hypertension (idiopathic) with ulcer of bilateral lower extremity: Secondary | ICD-10-CM | POA: Diagnosis not present

## 2023-10-02 DIAGNOSIS — I5042 Chronic combined systolic (congestive) and diastolic (congestive) heart failure: Secondary | ICD-10-CM | POA: Diagnosis not present

## 2023-10-02 DIAGNOSIS — L97311 Non-pressure chronic ulcer of right ankle limited to breakdown of skin: Secondary | ICD-10-CM | POA: Diagnosis not present

## 2023-10-02 DIAGNOSIS — L97321 Non-pressure chronic ulcer of left ankle limited to breakdown of skin: Secondary | ICD-10-CM | POA: Diagnosis not present

## 2023-10-02 DIAGNOSIS — I132 Hypertensive heart and chronic kidney disease with heart failure and with stage 5 chronic kidney disease, or end stage renal disease: Secondary | ICD-10-CM | POA: Diagnosis not present

## 2023-10-02 LAB — HEPATITIS B SURFACE ANTIGEN: Hepatitis B Surface Ag: NONREACTIVE

## 2023-10-02 NOTE — ED Notes (Addendum)
[facility] contacted for transportation home. Pt refusing all cardiac monitoring.

## 2023-10-02 NOTE — ED Notes (Signed)
Patient was given a [place] sandwich bag and a drink.

## 2023-10-02 NOTE — Discharge Instructions (Signed)
Continue attending your scheduled dialysis sessions.  Follow-up with your primary doctor as needed.  Adhere to your prescribed medications.

## 2023-10-02 NOTE — ED Notes (Signed)
[facility] declined transport

## 2023-10-02 NOTE — ED Provider Notes (Signed)
3:27 AM Patient back in the ED after dialysis. Had initially presented for SOB. Was dialyzed without issue. States he is feeling better. Is presently agitated stating, "I don't like being lied to. I was told that after dialysis I could go home and they bring me back down here". I explained to the patient the protocol and reason for return to the ED. Have reassured the patient that he is stable for discharge and that we will help try and coordinate a safe method of transport home.   Antony Madura, PA-C 10/02/23 0329    Gilda Crease, MD 10/02/23 [phone]

## 2023-10-02 NOTE — ED Notes (Signed)
Discharge instructions reviewed.   Pt verbally unhappy about transportation. Taxi voucher provided. [employer] contacted.   Opportunity for questions and concerns provided.   Alert, oriented and ambulatory. Escorted to waiting area via wheelchair.

## 2023-10-03 DIAGNOSIS — I132 Hypertensive heart and chronic kidney disease with heart failure and with stage 5 chronic kidney disease, or end stage renal disease: Secondary | ICD-10-CM | POA: Diagnosis not present

## 2023-10-03 DIAGNOSIS — L97311 Non-pressure chronic ulcer of right ankle limited to breakdown of skin: Secondary | ICD-10-CM | POA: Diagnosis not present

## 2023-10-03 DIAGNOSIS — I5042 Chronic combined systolic (congestive) and diastolic (congestive) heart failure: Secondary | ICD-10-CM | POA: Diagnosis not present

## 2023-10-03 DIAGNOSIS — I87313 Chronic venous hypertension (idiopathic) with ulcer of bilateral lower extremity: Secondary | ICD-10-CM | POA: Diagnosis not present

## 2023-10-03 DIAGNOSIS — E1122 Type 2 diabetes mellitus with diabetic chronic kidney disease: Secondary | ICD-10-CM | POA: Diagnosis not present

## 2023-10-03 DIAGNOSIS — L97321 Non-pressure chronic ulcer of left ankle limited to breakdown of skin: Secondary | ICD-10-CM | POA: Diagnosis not present

## 2023-10-03 LAB — HEPATITIS B SURFACE ANTIBODY, QUANTITATIVE: Hep B S AB Quant (Post): 158 m[IU]/mL

## 2023-10-04 DIAGNOSIS — D631 Anemia in chronic kidney disease: Secondary | ICD-10-CM | POA: Diagnosis not present

## 2023-10-04 DIAGNOSIS — Z992 Dependence on renal dialysis: Secondary | ICD-10-CM | POA: Diagnosis not present

## 2023-10-04 DIAGNOSIS — N2581 Secondary hyperparathyroidism of renal origin: Secondary | ICD-10-CM | POA: Diagnosis not present

## 2023-10-04 DIAGNOSIS — N186 End stage renal disease: Secondary | ICD-10-CM | POA: Diagnosis not present

## 2023-10-05 ENCOUNTER — Telehealth: Payer: Self-pay | Admitting: Sports Medicine

## 2023-10-05 ENCOUNTER — Other Ambulatory Visit: Payer: Self-pay

## 2023-10-05 DIAGNOSIS — L97311 Non-pressure chronic ulcer of right ankle limited to breakdown of skin: Secondary | ICD-10-CM | POA: Diagnosis not present

## 2023-10-05 DIAGNOSIS — L97321 Non-pressure chronic ulcer of left ankle limited to breakdown of skin: Secondary | ICD-10-CM | POA: Diagnosis not present

## 2023-10-05 DIAGNOSIS — I87313 Chronic venous hypertension (idiopathic) with ulcer of bilateral lower extremity: Secondary | ICD-10-CM | POA: Diagnosis not present

## 2023-10-05 DIAGNOSIS — I132 Hypertensive heart and chronic kidney disease with heart failure and with stage 5 chronic kidney disease, or end stage renal disease: Secondary | ICD-10-CM | POA: Diagnosis not present

## 2023-10-05 DIAGNOSIS — E1122 Type 2 diabetes mellitus with diabetic chronic kidney disease: Secondary | ICD-10-CM | POA: Diagnosis not present

## 2023-10-05 DIAGNOSIS — I5042 Chronic combined systolic (congestive) and diastolic (congestive) heart failure: Secondary | ICD-10-CM | POA: Diagnosis not present

## 2023-10-05 NOTE — Telephone Encounter (Signed)
Patient is requesting a refill of the following medications: Requested Prescriptions   Pending Prescriptions Disp Refills   oxyCODONE-acetaminophen (PERCOCET) 5-325 MG tablet 90 tablet 0    Sig: Take 1 tablet by mouth every 8 (eight) hours as needed for severe pain (pain score 7-10).    Date of last refill:09/08/2023  Refill amount: 90 tablets 0 refills

## 2023-10-05 NOTE — Telephone Encounter (Signed)
Patient called requesting a refill of his oxycodone. He would like to be contacted when this is sent in so he can make only one trip to the pharmacy.

## 2023-10-06 ENCOUNTER — Other Ambulatory Visit: Payer: Self-pay

## 2023-10-06 DIAGNOSIS — N186 End stage renal disease: Secondary | ICD-10-CM | POA: Diagnosis not present

## 2023-10-06 DIAGNOSIS — N2581 Secondary hyperparathyroidism of renal origin: Secondary | ICD-10-CM | POA: Diagnosis not present

## 2023-10-06 DIAGNOSIS — Z992 Dependence on renal dialysis: Secondary | ICD-10-CM | POA: Diagnosis not present

## 2023-10-06 DIAGNOSIS — D631 Anemia in chronic kidney disease: Secondary | ICD-10-CM | POA: Diagnosis not present

## 2023-10-06 MED ORDER — OXYCODONE-ACETAMINOPHEN 5-325 MG PO TABS: 1.0000 | ORAL_TABLET | Freq: Three times a day (TID) | ORAL | 0 refills | Status: DC | PRN

## 2023-10-06 NOTE — Telephone Encounter (Signed)
Patient called this morning and left voicemail stating for me to call back. I called patient and no answer. Voicemail was left with office call back number.

## 2023-10-06 NOTE — Addendum Note (Signed)
Addended by: [name] on: 10/06/2023 12:32 PM   Modules accepted: Orders

## 2023-10-06 NOTE — Addendum Note (Signed)
Addended by: [name] on: 10/06/2023 11:17 AM   Modules accepted: Orders

## 2023-10-06 NOTE — Telephone Encounter (Signed)
I called the pharmacy and they stated that they have not received prescription. Please send it in again. Patient has called the office MULTIPLE TIMES this morning for medication. Message routed back to PCP [name], MD

## 2023-10-06 NOTE — Telephone Encounter (Signed)
Patient called back and wants to know about his medication Oxycodone being refilled. Message routed back to PCP [name], MD

## 2023-10-06 NOTE — Telephone Encounter (Signed)
[name], MD59 minutes ago (11:17 AM)    Addended by: [name] on: 10/06/2023 11:17 AM     Modules accepted: Orders       Note   [name], MD  You1 hour ago (10:18 AM)    He requested refill too soon. Last refill on 11/15 for 30 days Resent the script to pharmacy what is listed on his chart, check with the pharmacy if he received it.

## 2023-10-06 NOTE — Telephone Encounter (Addendum)
[name], MD  Nyelli Samara, Jed Limerick, CMA   Resent the prescription to pharmacy listed.

## 2023-10-09 DIAGNOSIS — N186 End stage renal disease: Secondary | ICD-10-CM | POA: Diagnosis not present

## 2023-10-09 DIAGNOSIS — Z992 Dependence on renal dialysis: Secondary | ICD-10-CM | POA: Diagnosis not present

## 2023-10-09 DIAGNOSIS — N2581 Secondary hyperparathyroidism of renal origin: Secondary | ICD-10-CM | POA: Diagnosis not present

## 2023-10-09 DIAGNOSIS — D631 Anemia in chronic kidney disease: Secondary | ICD-10-CM | POA: Diagnosis not present

## 2023-10-10 ENCOUNTER — Telehealth (HOSPITAL_COMMUNITY): Payer: Self-pay

## 2023-10-10 ENCOUNTER — Ambulatory Visit: Payer: Self-pay

## 2023-10-10 DIAGNOSIS — I5042 Chronic combined systolic (congestive) and diastolic (congestive) heart failure: Secondary | ICD-10-CM | POA: Diagnosis not present

## 2023-10-10 DIAGNOSIS — E1122 Type 2 diabetes mellitus with diabetic chronic kidney disease: Secondary | ICD-10-CM | POA: Diagnosis not present

## 2023-10-10 DIAGNOSIS — L97311 Non-pressure chronic ulcer of right ankle limited to breakdown of skin: Secondary | ICD-10-CM | POA: Diagnosis not present

## 2023-10-10 DIAGNOSIS — L97321 Non-pressure chronic ulcer of left ankle limited to breakdown of skin: Secondary | ICD-10-CM | POA: Diagnosis not present

## 2023-10-10 DIAGNOSIS — I132 Hypertensive heart and chronic kidney disease with heart failure and with stage 5 chronic kidney disease, or end stage renal disease: Secondary | ICD-10-CM | POA: Diagnosis not present

## 2023-10-10 DIAGNOSIS — I87313 Chronic venous hypertension (idiopathic) with ulcer of bilateral lower extremity: Secondary | ICD-10-CM | POA: Diagnosis not present

## 2023-10-10 NOTE — Telephone Encounter (Signed)
Pt. Called and requested that this Rn review his lab and imaging results with him from his ED visit on 12/05/26/23.  Explained to patient that I am unable to discuss results..  Pt. Does have a PCP and will reach out to him to discuss results.  He was not interested in My Chart.  All questions answered

## 2023-10-11 ENCOUNTER — Telehealth: Payer: Self-pay

## 2023-10-11 DIAGNOSIS — D631 Anemia in chronic kidney disease: Secondary | ICD-10-CM | POA: Diagnosis not present

## 2023-10-11 DIAGNOSIS — N186 End stage renal disease: Secondary | ICD-10-CM | POA: Diagnosis not present

## 2023-10-11 DIAGNOSIS — Z992 Dependence on renal dialysis: Secondary | ICD-10-CM | POA: Diagnosis not present

## 2023-10-11 DIAGNOSIS — N2581 Secondary hyperparathyroidism of renal origin: Secondary | ICD-10-CM | POA: Diagnosis not present

## 2023-10-11 NOTE — Telephone Encounter (Signed)
[name], MD  You10 minutes ago (12:25 PM)    Ct scan did not show any acute changes suggestive of fluid build up in his lungs.   [name], MD  You12 minutes ago (12:23 PM)    I haven't got any msg from ED doctor   Discussed response with patient.

## 2023-10-11 NOTE — Patient Outreach (Signed)
  Care Coordination   Follow Up Visit Note   10/11/2023 Name: Jacob [name] MRN: [id number] DOB: 1950-11-27  Jacob [name] is a 72 y.o. year old male who sees [name], MD for primary care. I spoke with  Jacob [name] by phone today.  What matters to the patients health and wellness today?  Patient would like to keep his chronic pain under good control.     Goals Addressed             This Visit's Progress    To keep chronic pain well managed       Care Coordination Interventions: Reviewed provider established plan for pain management Discussed importance of adherence to all scheduled medical appointments Counseled on the importance of reporting any/all new or changed pain symptoms or management strategies to pain management provider Advised patient to report to care team affect of pain on daily activities Reviewed with patient prescribed pharmacological and nonpharmacological pain relief strategies Assessed social determinant of health barriers     Interventions Today    Flowsheet Row Most Recent Value  Chronic Disease   Chronic disease during today's visit Chronic Kidney Disease/End Stage Renal Disease (ESRD), Other  [chronic pain]  General Interventions   General Interventions Discussed/Reviewed General Interventions Discussed, General Interventions Reviewed, Doctor Visits  Doctor Visits Discussed/Reviewed Doctor Visits Discussed, Doctor Visits Reviewed, PCP  Education Interventions   Education Provided Provided Education  Provided Verbal Education On Medication, When to see the doctor  Pharmacy Interventions   Pharmacy Dicussed/Reviewed Pharmacy Topics Discussed, Pharmacy Topics Reviewed, Medications and their functions            SDOH assessments and interventions completed:  No     Care Coordination Interventions:  Yes, provided   Follow up plan: Follow up call scheduled for 11/02/23 @2 :00 PM    Encounter Outcome:  Patient Visit Completed

## 2023-10-11 NOTE — Telephone Encounter (Signed)
Patient states he called the hospital yesterday to get the results of his chest xray and CT report; Per patient he was told they ED doctor would discuss with [name], MD and have [name], MD share with him.   Please advise

## 2023-10-11 NOTE — Patient Instructions (Addendum)
Visit Information  Thank you for taking time to visit with me today. Please don't hesitate to contact me if I can be of assistance to you.   Following are the goals we discussed today:   Goals Addressed             This Visit's Progress    To keep chronic pain well managed       Care Coordination Interventions: Reviewed provider established plan for pain management Discussed importance of adherence to all scheduled medical appointments Counseled on the importance of reporting any/all new or changed pain symptoms or management strategies to pain management provider Advised patient to report to care team affect of pain on daily activities Reviewed with patient prescribed pharmacological and nonpharmacological pain relief strategies Assessed social determinant of health barriers         Our next appointment is by telephone on 11/02/23 at 2:00 PM   Please call the care guide team at [phone] if you need to cancel or reschedule your appointment.   If you are experiencing a Mental Health or Behavioral Health Crisis or need someone to talk to, please call 1-800-273-TALK (toll free, 24 hour hotline)  The patient verbalized understanding of instructions, educational materials, and care plan provided today and DECLINED offer to receive copy of patient instructions, educational materials, and care plan.   Delsa Sale RN BSN CCM Outlook  [facility], [facility] Nurse Care Coordinator  Direct Dial: [phone] Website: [email or web address]

## 2023-10-12 ENCOUNTER — Other Ambulatory Visit: Payer: Self-pay

## 2023-10-12 ENCOUNTER — Ambulatory Visit: Payer: 59 | Admitting: Podiatry

## 2023-10-12 ENCOUNTER — Other Ambulatory Visit (HOSPITAL_COMMUNITY): Payer: Self-pay

## 2023-10-12 NOTE — Progress Notes (Signed)
Specialty Pharmacy Refill Coordination Note  Jacob [name] is a 72 y.o. male contacted today regarding refills of specialty medication(s) Octreotide Acetate (SandoSTATIN [place])   Patient requested Courier to Provider Office   Delivery date: 10/20/23   Verified address:  [facility] [address] [place], [place], [address]   Medication will be filled on 12.26.24.

## 2023-10-13 DIAGNOSIS — D631 Anemia in chronic kidney disease: Secondary | ICD-10-CM | POA: Diagnosis not present

## 2023-10-13 DIAGNOSIS — N2581 Secondary hyperparathyroidism of renal origin: Secondary | ICD-10-CM | POA: Diagnosis not present

## 2023-10-13 DIAGNOSIS — N186 End stage renal disease: Secondary | ICD-10-CM | POA: Diagnosis not present

## 2023-10-13 DIAGNOSIS — Z992 Dependence on renal dialysis: Secondary | ICD-10-CM | POA: Diagnosis not present

## 2023-10-15 DIAGNOSIS — D631 Anemia in chronic kidney disease: Secondary | ICD-10-CM | POA: Diagnosis not present

## 2023-10-15 DIAGNOSIS — N186 End stage renal disease: Secondary | ICD-10-CM | POA: Diagnosis not present

## 2023-10-15 DIAGNOSIS — N2581 Secondary hyperparathyroidism of renal origin: Secondary | ICD-10-CM | POA: Diagnosis not present

## 2023-10-15 DIAGNOSIS — Z992 Dependence on renal dialysis: Secondary | ICD-10-CM | POA: Diagnosis not present

## 2023-10-16 DIAGNOSIS — L97321 Non-pressure chronic ulcer of left ankle limited to breakdown of skin: Secondary | ICD-10-CM | POA: Diagnosis not present

## 2023-10-16 DIAGNOSIS — L97311 Non-pressure chronic ulcer of right ankle limited to breakdown of skin: Secondary | ICD-10-CM | POA: Diagnosis not present

## 2023-10-16 DIAGNOSIS — I5042 Chronic combined systolic (congestive) and diastolic (congestive) heart failure: Secondary | ICD-10-CM | POA: Diagnosis not present

## 2023-10-16 DIAGNOSIS — I132 Hypertensive heart and chronic kidney disease with heart failure and with stage 5 chronic kidney disease, or end stage renal disease: Secondary | ICD-10-CM | POA: Diagnosis not present

## 2023-10-16 DIAGNOSIS — E1122 Type 2 diabetes mellitus with diabetic chronic kidney disease: Secondary | ICD-10-CM | POA: Diagnosis not present

## 2023-10-16 DIAGNOSIS — I87313 Chronic venous hypertension (idiopathic) with ulcer of bilateral lower extremity: Secondary | ICD-10-CM | POA: Diagnosis not present

## 2023-10-17 DIAGNOSIS — D631 Anemia in chronic kidney disease: Secondary | ICD-10-CM | POA: Diagnosis not present

## 2023-10-17 DIAGNOSIS — Z992 Dependence on renal dialysis: Secondary | ICD-10-CM | POA: Diagnosis not present

## 2023-10-17 DIAGNOSIS — N2581 Secondary hyperparathyroidism of renal origin: Secondary | ICD-10-CM | POA: Diagnosis not present

## 2023-10-17 DIAGNOSIS — N186 End stage renal disease: Secondary | ICD-10-CM | POA: Diagnosis not present

## 2023-10-19 ENCOUNTER — Other Ambulatory Visit (HOSPITAL_COMMUNITY): Payer: Self-pay

## 2023-10-19 ENCOUNTER — Encounter: Payer: Self-pay | Admitting: Podiatry

## 2023-10-19 ENCOUNTER — Ambulatory Visit (INDEPENDENT_AMBULATORY_CARE_PROVIDER_SITE_OTHER): Payer: 59 | Admitting: Podiatry

## 2023-10-19 DIAGNOSIS — I739 Peripheral vascular disease, unspecified: Secondary | ICD-10-CM | POA: Diagnosis not present

## 2023-10-19 DIAGNOSIS — M79675 Pain in left toe(s): Secondary | ICD-10-CM | POA: Diagnosis not present

## 2023-10-19 DIAGNOSIS — L97111 Non-pressure chronic ulcer of right thigh limited to breakdown of skin: Secondary | ICD-10-CM

## 2023-10-19 DIAGNOSIS — M79674 Pain in right toe(s): Secondary | ICD-10-CM | POA: Diagnosis not present

## 2023-10-19 DIAGNOSIS — E1142 Type 2 diabetes mellitus with diabetic polyneuropathy: Secondary | ICD-10-CM

## 2023-10-19 DIAGNOSIS — I87319 Chronic venous hypertension (idiopathic) with ulcer of unspecified lower extremity: Secondary | ICD-10-CM

## 2023-10-19 DIAGNOSIS — L97909 Non-pressure chronic ulcer of unspecified part of unspecified lower leg with unspecified severity: Secondary | ICD-10-CM

## 2023-10-19 DIAGNOSIS — B351 Tinea unguium: Secondary | ICD-10-CM | POA: Diagnosis not present

## 2023-10-19 NOTE — Progress Notes (Signed)
Subjective:  [name], male    DOB: 1951-06-23,  MRN: [id number]   72 y.o. male presents diabetic footcare.  Had prior fourth MPJ resection over 1 year ago.  The patient states he is coming from a facility where they are treating bilateral medial ankle ulcerations with unna boots. States he sees a wound care provider there.  He also points out a new developing lesions to his upper right thigh that have been going on for several weeks at this point.   Past Medical History:  Diagnosis Date   Acute respiratory failure (HCC) 03/01/2018   Anemia    Arthritis    "all over; mostly knees and back" (02/28/2018)   Chronic combined systolic and diastolic CHF (congestive heart failure) (HCC)    Chronic lower back pain    stenosis   Community acquired pneumonia 09/06/2013   COPD (chronic obstructive pulmonary disease) (HCC)    Coronary atherosclerosis of native coronary artery    a. 02/2003 s/p CABG x 2 (VG->RI, VG->RPDA; b. 11/2019 PCI: LM nl, LAD 90d, D3 50, RI 100, LCX 100p, OM3 100 - fills via L->L collats from D2/dLAD, RCA 100p, VG->RPDA ok, VG->RI 95 (3.5x48 Synergy XD DES).   Drug abuse (HCC)    hx; tested for cocaine as recently as 2/08. says he is not using drugs now - avoided defib. for this reason    ESRD (end stage renal disease) (HCC)    [facility]-F- [facility]   Fall at home 10/2020   GERD (gastroesophageal reflux disease)    takes OTC meds as needed   GI bleeding    a. 11/2019 EGD: angiodysplastic lesions w/ bleeding s/p argon plasma/clipping/epi inj. Multiple admissions for the same.   Glaucoma    uses eye drops daily   Hepatitis B 1968   "tx'd w/isolation; caught it from toilet stools in gym"   History of blood transfusion 03/01/2019   History of colon polyps    benign   History of gout    takes Allopurinol daily as well as Colchicine-if needed (02/28/2018)   History of kidney stones    HTN (hypertension)    takes Coreg,Imdur.and Apresoline daily   Human  immunodeficiency virus (HIV) disease (HCC) dx'd 1995   on Biktarvy as of 12/2020.     Hyperlipidemia    Ischemic cardiomyopathy    a. 01/2019 Echo: EF 40-45%, diffuse HK, mild basal septal hypertrophy. Diast dysfxn. Nl RV size/fxn. Sev dil LA. Triv MR/TR/PR.   Muscle spasm    takes Zanaflex as needed   Myocardial infarction (HCC) ~ 2004/2005   Nocturia    Peripheral neuropathy    takes gabapentin daily   Pneumonia    "at least twice" (02/28/2018)   SDH (subdural hematoma) (HCC)    Syphilis, unspecified    Type II diabetes mellitus (HCC) 2004   Lantus daily.Average fasting blood sugar 125-199   Wears glasses    Wears partial dentures     Allergies  Allergen Reactions   Augmentin [Amoxicillin-Pot Clavulanate] Diarrhea and Other (See Comments)    Severe diarrhea   Morphine Sulfate Anaphylaxis    Can't mix with oxycodone    Mucinex Fast-Max Other (See Comments)    Intense sweating    Amphetamines Other (See Comments)    Unknown reaction    ROS: Negative except as per HPI above  Objective:  General: AAO x3, NAD  Dermatological: Bilateral medial ankle ulcerations with regular borders, no significant drainage, fibrogranular base, approximately 2  cm in diameter present.  No surrounding erythema or signs of acute infection.  Of note, right anterior thigh appear to be developing lesions with violaceous borders  The nail plates bilaterally are notable for increased thickness, increased length, dystrophic nature with subungual debris and discoloration.  They are tender on direct dorsal palpation.  Vascular:  Dorsalis Pedis artery faintly palpable bilaterally and Posterior Tibial artery pedal pulses nonpalpable bilaterally.  Capillary fill time < 3 sec to all digits.  Absent pedal hair growth  Neruologic: Grossly diminished via light touch bilateral. Protective threshold diminished bilaterally  Musculoskeletal: Status post right fourth metatarsal head resection  Radiographs:   Deferred Assessment:   1. DM type 2 with diabetic peripheral neuropathy (HCC)   2. PAD (peripheral artery disease) (HCC)   3. Pain due to onychomycosis of toenails of both feet   4. Chronic venous hypertension w ulceration (HCC)   5. Chronic ulcer of right thigh limited to breakdown of skin [facility])       Plan:  Patient was evaluated and treated and all questions answered.  # Pain due to onychomycosis of toenails -Nails x 10 were debrided in thickness and length using sterile nail nippers without incident.  Nails filed thin using mechanical bur  # Bilateral medial ankle ulcerations -Appear venous in nature - No debridement necessary today - Cleanse with wound cleanser.  Maxorb AG applied.  Unna boots applied bilaterally  # Ulcerations to right thigh -Violaceous borders - Some concern for pyoderma gangrenosum or other etiology mediated by kidney disease -Referral placed to wound care center as these wounds are outside of scope of podiatry based on anatomic location. -I certify that this diagnosis represents a distinct and separate diagnosis that requires evaluation and treatment separate from other procedures or diagnosis   Return in 3 months for diabetic footcare, may return sooner if condition worsens, new pedal complaints arise or if there is delay with establishing care with wound care provider.        Bronwen Betters, DPM [facility] / [facility]

## 2023-10-20 ENCOUNTER — Other Ambulatory Visit: Payer: Self-pay

## 2023-10-20 DIAGNOSIS — N2581 Secondary hyperparathyroidism of renal origin: Secondary | ICD-10-CM | POA: Diagnosis not present

## 2023-10-20 DIAGNOSIS — N186 End stage renal disease: Secondary | ICD-10-CM | POA: Diagnosis not present

## 2023-10-20 DIAGNOSIS — Z992 Dependence on renal dialysis: Secondary | ICD-10-CM | POA: Diagnosis not present

## 2023-10-20 DIAGNOSIS — D631 Anemia in chronic kidney disease: Secondary | ICD-10-CM | POA: Diagnosis not present

## 2023-10-22 ENCOUNTER — Telehealth: Payer: Self-pay | Admitting: Adult Health

## 2023-10-22 DIAGNOSIS — Z992 Dependence on renal dialysis: Secondary | ICD-10-CM | POA: Diagnosis not present

## 2023-10-22 DIAGNOSIS — N2581 Secondary hyperparathyroidism of renal origin: Secondary | ICD-10-CM | POA: Diagnosis not present

## 2023-10-22 DIAGNOSIS — D631 Anemia in chronic kidney disease: Secondary | ICD-10-CM | POA: Diagnosis not present

## 2023-10-22 DIAGNOSIS — N186 End stage renal disease: Secondary | ICD-10-CM | POA: Diagnosis not present

## 2023-10-22 NOTE — Telephone Encounter (Signed)
Patient called and stated that he "gotten hold of some salt and can feel it in his hands and legs". He denies shortness of breath and since his legs are wrapped, he doesn't know how they look. He does not know how much salt he took. He said that he has eaten Chettos too. He wants to know if he can eat sugar free  popsicles. Advised that he can take a little bit of water instead of popsicles and call office for appointment if he wants to be seen.

## 2023-10-23 DIAGNOSIS — E1122 Type 2 diabetes mellitus with diabetic chronic kidney disease: Secondary | ICD-10-CM | POA: Diagnosis not present

## 2023-10-23 DIAGNOSIS — I87313 Chronic venous hypertension (idiopathic) with ulcer of bilateral lower extremity: Secondary | ICD-10-CM | POA: Diagnosis not present

## 2023-10-23 DIAGNOSIS — L97311 Non-pressure chronic ulcer of right ankle limited to breakdown of skin: Secondary | ICD-10-CM | POA: Diagnosis not present

## 2023-10-23 DIAGNOSIS — L97321 Non-pressure chronic ulcer of left ankle limited to breakdown of skin: Secondary | ICD-10-CM | POA: Diagnosis not present

## 2023-10-23 DIAGNOSIS — I5042 Chronic combined systolic (congestive) and diastolic (congestive) heart failure: Secondary | ICD-10-CM | POA: Diagnosis not present

## 2023-10-23 DIAGNOSIS — I132 Hypertensive heart and chronic kidney disease with heart failure and with stage 5 chronic kidney disease, or end stage renal disease: Secondary | ICD-10-CM | POA: Diagnosis not present

## 2023-10-24 ENCOUNTER — Encounter (HOSPITAL_COMMUNITY): Payer: 59

## 2023-10-24 DIAGNOSIS — Z992 Dependence on renal dialysis: Secondary | ICD-10-CM | POA: Diagnosis not present

## 2023-10-24 DIAGNOSIS — I129 Hypertensive chronic kidney disease with stage 1 through stage 4 chronic kidney disease, or unspecified chronic kidney disease: Secondary | ICD-10-CM | POA: Diagnosis not present

## 2023-10-24 DIAGNOSIS — N2581 Secondary hyperparathyroidism of renal origin: Secondary | ICD-10-CM | POA: Diagnosis not present

## 2023-10-24 DIAGNOSIS — N186 End stage renal disease: Secondary | ICD-10-CM | POA: Diagnosis not present

## 2023-10-24 DIAGNOSIS — D631 Anemia in chronic kidney disease: Secondary | ICD-10-CM | POA: Diagnosis not present

## 2023-10-26 ENCOUNTER — Other Ambulatory Visit: Payer: Self-pay | Admitting: Cardiovascular Disease

## 2023-10-26 ENCOUNTER — Ambulatory Visit: Payer: 59 | Admitting: Internal Medicine

## 2023-10-26 ENCOUNTER — Ambulatory Visit (HOSPITAL_COMMUNITY)
Admission: RE | Admit: 2023-10-26 | Discharge: 2023-10-26 | Disposition: A | Discharge status: Home / Self Care | Payer: 59 | Source: Ambulatory Visit | Attending: Gastroenterology | Admitting: Gastroenterology

## 2023-10-26 DIAGNOSIS — Q2730 Arteriovenous malformation, site unspecified: Secondary | ICD-10-CM | POA: Insufficient documentation

## 2023-10-26 DIAGNOSIS — D649 Anemia, unspecified: Secondary | ICD-10-CM | POA: Diagnosis not present

## 2023-10-26 MED ORDER — OCTREOTIDE ACETATE 10 MG IM KIT
10.0000 mg | PACK | INTRAMUSCULAR | Status: DC
  Administered 2023-10-26: 10 mg via INTRAMUSCULAR
  Filled 2023-10-26: qty 1

## 2023-10-27 ENCOUNTER — Other Ambulatory Visit: Payer: Self-pay | Admitting: Cardiovascular Disease

## 2023-10-27 DIAGNOSIS — N2581 Secondary hyperparathyroidism of renal origin: Secondary | ICD-10-CM | POA: Diagnosis not present

## 2023-10-27 DIAGNOSIS — N186 End stage renal disease: Secondary | ICD-10-CM | POA: Diagnosis not present

## 2023-10-27 DIAGNOSIS — Z992 Dependence on renal dialysis: Secondary | ICD-10-CM | POA: Diagnosis not present

## 2023-10-27 DIAGNOSIS — D688 Other specified coagulation defects: Secondary | ICD-10-CM | POA: Diagnosis not present

## 2023-10-27 DIAGNOSIS — E1122 Type 2 diabetes mellitus with diabetic chronic kidney disease: Secondary | ICD-10-CM | POA: Diagnosis not present

## 2023-10-27 DIAGNOSIS — D631 Anemia in chronic kidney disease: Secondary | ICD-10-CM | POA: Diagnosis not present

## 2023-10-30 DIAGNOSIS — N186 End stage renal disease: Secondary | ICD-10-CM | POA: Diagnosis not present

## 2023-10-30 DIAGNOSIS — Z992 Dependence on renal dialysis: Secondary | ICD-10-CM | POA: Diagnosis not present

## 2023-10-30 DIAGNOSIS — E1122 Type 2 diabetes mellitus with diabetic chronic kidney disease: Secondary | ICD-10-CM | POA: Diagnosis not present

## 2023-10-30 DIAGNOSIS — D688 Other specified coagulation defects: Secondary | ICD-10-CM | POA: Diagnosis not present

## 2023-10-30 DIAGNOSIS — D631 Anemia in chronic kidney disease: Secondary | ICD-10-CM | POA: Diagnosis not present

## 2023-10-30 DIAGNOSIS — N2581 Secondary hyperparathyroidism of renal origin: Secondary | ICD-10-CM | POA: Diagnosis not present

## 2023-10-31 ENCOUNTER — Ambulatory Visit: Payer: 59 | Admitting: Internal Medicine

## 2023-10-31 ENCOUNTER — Encounter: Payer: Self-pay | Admitting: Internal Medicine

## 2023-10-31 ENCOUNTER — Other Ambulatory Visit: Payer: Self-pay

## 2023-10-31 ENCOUNTER — Other Ambulatory Visit: Payer: Self-pay | Admitting: Sports Medicine

## 2023-10-31 VITALS — BP 112/76 | HR 74 | Resp 16 | Ht 74.0 in | Wt 175.0 lb

## 2023-10-31 DIAGNOSIS — M109 Gout, unspecified: Secondary | ICD-10-CM

## 2023-10-31 DIAGNOSIS — E1122 Type 2 diabetes mellitus with diabetic chronic kidney disease: Secondary | ICD-10-CM | POA: Diagnosis not present

## 2023-10-31 DIAGNOSIS — I132 Hypertensive heart and chronic kidney disease with heart failure and with stage 5 chronic kidney disease, or end stage renal disease: Secondary | ICD-10-CM | POA: Diagnosis not present

## 2023-10-31 DIAGNOSIS — I5042 Chronic combined systolic (congestive) and diastolic (congestive) heart failure: Secondary | ICD-10-CM | POA: Diagnosis not present

## 2023-10-31 DIAGNOSIS — L97311 Non-pressure chronic ulcer of right ankle limited to breakdown of skin: Secondary | ICD-10-CM | POA: Diagnosis not present

## 2023-10-31 DIAGNOSIS — B20 Human immunodeficiency virus [HIV] disease: Secondary | ICD-10-CM | POA: Diagnosis not present

## 2023-10-31 DIAGNOSIS — L97321 Non-pressure chronic ulcer of left ankle limited to breakdown of skin: Secondary | ICD-10-CM | POA: Diagnosis not present

## 2023-10-31 DIAGNOSIS — Z23 Encounter for immunization: Secondary | ICD-10-CM

## 2023-10-31 DIAGNOSIS — I87313 Chronic venous hypertension (idiopathic) with ulcer of bilateral lower extremity: Secondary | ICD-10-CM | POA: Diagnosis not present

## 2023-11-01 ENCOUNTER — Encounter: Payer: Self-pay | Admitting: Internal Medicine

## 2023-11-01 DIAGNOSIS — D631 Anemia in chronic kidney disease: Secondary | ICD-10-CM | POA: Diagnosis not present

## 2023-11-01 DIAGNOSIS — E1122 Type 2 diabetes mellitus with diabetic chronic kidney disease: Secondary | ICD-10-CM | POA: Diagnosis not present

## 2023-11-01 DIAGNOSIS — Z992 Dependence on renal dialysis: Secondary | ICD-10-CM | POA: Diagnosis not present

## 2023-11-01 DIAGNOSIS — D688 Other specified coagulation defects: Secondary | ICD-10-CM | POA: Diagnosis not present

## 2023-11-01 DIAGNOSIS — N186 End stage renal disease: Secondary | ICD-10-CM | POA: Diagnosis not present

## 2023-11-01 DIAGNOSIS — N2581 Secondary hyperparathyroidism of renal origin: Secondary | ICD-10-CM | POA: Diagnosis not present

## 2023-11-01 LAB — T-HELPER CELLS (CD4) COUNT (NOT AT ARMC)
Absolute CD4: 471 {cells}/uL — ABNORMAL LOW (ref 490–1740)
CD4 T Helper %: 33 % (ref 30–61)
Total lymphocyte count: 1408 {cells}/uL (ref 850–3900)

## 2023-11-01 NOTE — Progress Notes (Signed)
   Subjective:    [name], male    DOB: 23-Feb-1951, 73 y.o.   MRN: [id number]  HPI Ray is here for follow-up of HIV. He has been on Biktarvy  and continues with that.  He has no issues with getting the medication or taking it.  He continues with dialysis.  He has no concerns today.   Review of Systems  Constitutional:  Negative for fatigue.  Gastrointestinal:  Negative for diarrhea and nausea.       Objective:   Physical Exam Eyes:     General: No scleral icterus. Pulmonary:     Effort: Pulmonary effort is normal.  Neurological:     Mental Status: He is alert.   SH: + tobacco        Assessment & Plan:

## 2023-11-01 NOTE — Telephone Encounter (Signed)
 Patient has request refill on medication not on list. Medication pend and sent to PCP [name], MD

## 2023-11-01 NOTE — Assessment & Plan Note (Signed)
 Continues to do well on Biktarvy  and no issues today.  Check his labs today and discussed the importance of treatment and staying on the Biktarvy .  He has remained compliant and has no issues.  He will follow-up in 6 months unless there are concerns on labs today.  I have personally spent 30 minutes involved in face-to-face and non-face-to-face activities for this patient on the day of the visit. Professional time spent includes the following activities: Preparing to see the patient (review of tests), Obtaining and/or reviewing separately obtained history (admission/discharge record), Performing a medically appropriate examination and/or evaluation , Ordering medications/tests/procedures, referring and communicating with other health care professionals, Documenting clinical information in the EMR, Independently interpreting results (not separately reported), Communicating results to the patient/family/caregiver, Counseling and educating the patient/family/caregiver and Care coordination (not separately reported).

## 2023-11-02 ENCOUNTER — Ambulatory Visit: Payer: Self-pay

## 2023-11-02 LAB — HIV-1 RNA QUANT-NO REFLEX-BLD
HIV 1 RNA Quant: NOT DETECTED {copies}/mL
HIV-1 RNA Quant, Log: NOT DETECTED {Log_copies}/mL

## 2023-11-02 NOTE — Patient Outreach (Signed)
  Care Coordination   Follow Up Visit Note   11/02/2023 Name: Jacob [name] MRN: [id number] DOB: 1950-12-16  Jacob [name] is a 73 y.o. year old male who sees Sherlynn Madden, MD for primary care. I spoke with  Jacob [name] by phone today.  What matters to the patients health and wellness today?  Patient would like to have complete wound healing without complications.     Goals Addressed             This Visit's Progress    To have ankle ulcerations heal without complications   On track    Care Coordination Interventions: Evaluation of current treatment plan related to bilateral ulcerations to inner ankles and patient's adherence to plan as established by provider Determined patient continues to receive in home skilled nursing for wound care Reviewed and discussed with patient his recent f/u with Podiatry Review of patient status, including review of consultant's reports, relevant laboratory and other test results, and medications completed Determined patient cancelled his wound care referral, he was not sure why it was needed Plan:  Patient was evaluated and treated and all questions answered. # Pain due to onychomycosis of toenails -Nails x 10 were debrided in thickness and length using sterile nail nippers without incident.  Nails filed thin using mechanical bur # Bilateral medial ankle ulcerations -Appear venous in nature - No debridement necessary today - Cleanse with wound cleanser.  Maxorb AG applied.  Unna boots applied bilaterally # Ulcerations to right thigh -Violaceous borders - Some concern for pyoderma gangrenosum or other etiology mediated by kidney disease -Referral placed to wound care center as these wounds are outside of scope of podiatry based on anatomic location. -I certify that this diagnosis represents a distinct and separate diagnosis that requires evaluation and treatment separate from other procedures or diagnosis  Return in 3 months for  diabetic footcare, may return sooner if condition worsens, new pedal complaints arise or if there is delay with establishing care with wound care provider Determined after reviewing patient plan of care, he has decided to contact the wound care clinic to request an appointment as directed  Instructed patient to keep his doctor informed of new or worsening symptoms      To have right knee injury evaluated   On track    Care Coordination Interventions: Evaluation of current treatment plan related to impaired physical mobility; chronic pain syndrome and patient's adherence to plan as established by provider Reviewed provider established plan for pain management Discussed with patient he is scheduled to follow up with [facility] on 11/09/23 Discussed importance of adherence to all scheduled medical appointments    Interventions Today    Flowsheet Row Most Recent Value  Chronic Disease   Chronic disease during today's visit Other  [Chronic venous hypertension w ulceration,  Chronic ulcer of right thigh limited to breakdown of skin]  General Interventions   General Interventions Discussed/Reviewed General Interventions Discussed, General Interventions Reviewed, Doctor Visits  Doctor Visits Discussed/Reviewed Doctor Visits Discussed, Doctor Visits Reviewed, Specialist  Education Interventions   Education Provided Provided Education  Provided Verbal Education On When to see the doctor          SDOH assessments and interventions completed:  No     Care Coordination Interventions:  Yes, provided   Follow up plan: Follow up call scheduled for 11/28/23 @2 :00 PM    Encounter Outcome:  Patient Visit Completed

## 2023-11-02 NOTE — Patient Instructions (Signed)
 Visit Information  Thank you for taking time to visit with me today. Please don't hesitate to contact me if I can be of assistance to you.   Following are the goals we discussed today:   Goals Addressed             This Visit's Progress    To have ankle ulcerations heal without complications   On track    Care Coordination Interventions: Evaluation of current treatment plan related to bilateral ulcerations to inner ankles and patient's adherence to plan as established by provider Determined patient continues to receive in home skilled nursing for wound care Reviewed and discussed with patient his recent f/u with Podiatry Review of patient status, including review of consultant's reports, relevant laboratory and other test results, and medications completed Determined patient cancelled his wound care referral, he was not sure why it was needed Plan:  Patient was evaluated and treated and all questions answered. # Pain due to onychomycosis of toenails -Nails x 10 were debrided in thickness and length using sterile nail nippers without incident.  Nails filed thin using mechanical bur # Bilateral medial ankle ulcerations -Appear venous in nature - No debridement necessary today - Cleanse with wound cleanser.  Maxorb AG applied.  Unna boots applied bilaterally # Ulcerations to right thigh -Violaceous borders - Some concern for pyoderma gangrenosum or other etiology mediated by kidney disease -Referral placed to wound care center as these wounds are outside of scope of podiatry based on anatomic location. -I certify that this diagnosis represents a distinct and separate diagnosis that requires evaluation and treatment separate from other procedures or diagnosis  Return in 3 months for diabetic footcare, may return sooner if condition worsens, new pedal complaints arise or if there is delay with establishing care with wound care provider Determined after reviewing patient plan of care, he  has decided to contact the wound care clinic to request an appointment as directed  Instructed patient to keep his doctor informed of new or worsening symptoms      To have right knee injury evaluated   On track    Care Coordination Interventions: Evaluation of current treatment plan related to impaired physical mobility; chronic pain syndrome and patient's adherence to plan as established by provider Reviewed provider established plan for pain management Discussed with patient he is scheduled to follow up with [facility] on 11/09/23 Discussed importance of adherence to all scheduled medical appointments        Our next appointment is by telephone on 11/28/23 at 2:00 PM  Please call the care guide team at [phone] if you need to cancel or reschedule your appointment.   If you are experiencing a Mental Health or Behavioral Health Crisis or need someone to talk to, please call 1-800-273-TALK (toll free, 24 hour hotline)  The patient verbalized understanding of instructions, educational materials, and care plan provided today and DECLINED offer to receive copy of patient instructions, educational materials, and care plan.   Clayborne Ly RN BSN CCM   [facility], [facility] Health Nurse Care Coordinator  Direct Dial : [phone] Website: [email or web address]

## 2023-11-03 DIAGNOSIS — E1122 Type 2 diabetes mellitus with diabetic chronic kidney disease: Secondary | ICD-10-CM | POA: Diagnosis not present

## 2023-11-03 DIAGNOSIS — Z992 Dependence on renal dialysis: Secondary | ICD-10-CM | POA: Diagnosis not present

## 2023-11-03 DIAGNOSIS — D631 Anemia in chronic kidney disease: Secondary | ICD-10-CM | POA: Diagnosis not present

## 2023-11-03 DIAGNOSIS — N186 End stage renal disease: Secondary | ICD-10-CM | POA: Diagnosis not present

## 2023-11-03 DIAGNOSIS — N2581 Secondary hyperparathyroidism of renal origin: Secondary | ICD-10-CM | POA: Diagnosis not present

## 2023-11-03 DIAGNOSIS — D688 Other specified coagulation defects: Secondary | ICD-10-CM | POA: Diagnosis not present

## 2023-11-06 ENCOUNTER — Other Ambulatory Visit: Payer: Self-pay | Admitting: *Deleted

## 2023-11-06 DIAGNOSIS — E1122 Type 2 diabetes mellitus with diabetic chronic kidney disease: Secondary | ICD-10-CM | POA: Diagnosis not present

## 2023-11-06 DIAGNOSIS — D688 Other specified coagulation defects: Secondary | ICD-10-CM | POA: Diagnosis not present

## 2023-11-06 DIAGNOSIS — Z992 Dependence on renal dialysis: Secondary | ICD-10-CM | POA: Diagnosis not present

## 2023-11-06 DIAGNOSIS — D631 Anemia in chronic kidney disease: Secondary | ICD-10-CM | POA: Diagnosis not present

## 2023-11-06 DIAGNOSIS — N186 End stage renal disease: Secondary | ICD-10-CM | POA: Diagnosis not present

## 2023-11-06 DIAGNOSIS — N2581 Secondary hyperparathyroidism of renal origin: Secondary | ICD-10-CM | POA: Diagnosis not present

## 2023-11-06 MED ORDER — OXYCODONE-ACETAMINOPHEN 5-325 MG PO TABS: 1.0000 | ORAL_TABLET | Freq: Three times a day (TID) | ORAL | 0 refills | Status: DC | PRN

## 2023-11-06 NOTE — Telephone Encounter (Signed)
 Patient called requesting refill.  Epic LR: 10/06/2023 Contract Date: 09/07/2022 Note added to upcoming appointment to update   Pended Rx and sent to Dr. Jacquenette Shone for approval.

## 2023-11-07 ENCOUNTER — Other Ambulatory Visit: Payer: Self-pay

## 2023-11-07 DIAGNOSIS — I87313 Chronic venous hypertension (idiopathic) with ulcer of bilateral lower extremity: Secondary | ICD-10-CM | POA: Diagnosis not present

## 2023-11-07 DIAGNOSIS — L97311 Non-pressure chronic ulcer of right ankle limited to breakdown of skin: Secondary | ICD-10-CM | POA: Diagnosis not present

## 2023-11-07 DIAGNOSIS — I132 Hypertensive heart and chronic kidney disease with heart failure and with stage 5 chronic kidney disease, or end stage renal disease: Secondary | ICD-10-CM | POA: Diagnosis not present

## 2023-11-07 DIAGNOSIS — L97321 Non-pressure chronic ulcer of left ankle limited to breakdown of skin: Secondary | ICD-10-CM | POA: Diagnosis not present

## 2023-11-07 DIAGNOSIS — E1122 Type 2 diabetes mellitus with diabetic chronic kidney disease: Secondary | ICD-10-CM | POA: Diagnosis not present

## 2023-11-07 DIAGNOSIS — I5042 Chronic combined systolic (congestive) and diastolic (congestive) heart failure: Secondary | ICD-10-CM | POA: Diagnosis not present

## 2023-11-07 MED ORDER — OXYCODONE-ACETAMINOPHEN 5-325 MG PO TABS: 1.0000 | ORAL_TABLET | Freq: Three times a day (TID) | ORAL | 0 refills | Status: DC | PRN

## 2023-11-07 NOTE — Telephone Encounter (Signed)
 Looks like the order printed from the first order

## 2023-11-07 NOTE — Addendum Note (Signed)
 Addended by: [name] on: 11/07/2023 12:53 PM   Modules accepted: Orders

## 2023-11-07 NOTE — Telephone Encounter (Signed)
 Jacob Sheffield, MD resent Rx to the pharmacy

## 2023-11-08 DIAGNOSIS — E1122 Type 2 diabetes mellitus with diabetic chronic kidney disease: Secondary | ICD-10-CM | POA: Diagnosis not present

## 2023-11-08 DIAGNOSIS — D631 Anemia in chronic kidney disease: Secondary | ICD-10-CM | POA: Diagnosis not present

## 2023-11-08 DIAGNOSIS — Z992 Dependence on renal dialysis: Secondary | ICD-10-CM | POA: Diagnosis not present

## 2023-11-08 DIAGNOSIS — N186 End stage renal disease: Secondary | ICD-10-CM | POA: Diagnosis not present

## 2023-11-08 DIAGNOSIS — N2581 Secondary hyperparathyroidism of renal origin: Secondary | ICD-10-CM | POA: Diagnosis not present

## 2023-11-08 DIAGNOSIS — D688 Other specified coagulation defects: Secondary | ICD-10-CM | POA: Diagnosis not present

## 2023-11-09 DIAGNOSIS — E113293 Type 2 diabetes mellitus with mild nonproliferative diabetic retinopathy without macular edema, bilateral: Secondary | ICD-10-CM | POA: Diagnosis not present

## 2023-11-09 DIAGNOSIS — H2511 Age-related nuclear cataract, right eye: Secondary | ICD-10-CM | POA: Diagnosis not present

## 2023-11-09 DIAGNOSIS — H401132 Primary open-angle glaucoma, bilateral, moderate stage: Secondary | ICD-10-CM | POA: Diagnosis not present

## 2023-11-09 DIAGNOSIS — M25562 Pain in left knee: Secondary | ICD-10-CM | POA: Diagnosis not present

## 2023-11-09 DIAGNOSIS — H524 Presbyopia: Secondary | ICD-10-CM | POA: Diagnosis not present

## 2023-11-09 DIAGNOSIS — H02051 Trichiasis without entropian right upper eyelid: Secondary | ICD-10-CM | POA: Diagnosis not present

## 2023-11-09 DIAGNOSIS — H5211 Myopia, right eye: Secondary | ICD-10-CM | POA: Diagnosis not present

## 2023-11-09 DIAGNOSIS — H25011 Cortical age-related cataract, right eye: Secondary | ICD-10-CM | POA: Diagnosis not present

## 2023-11-09 LAB — HM DIABETES EYE EXAM

## 2023-11-10 DIAGNOSIS — N2581 Secondary hyperparathyroidism of renal origin: Secondary | ICD-10-CM | POA: Diagnosis not present

## 2023-11-10 DIAGNOSIS — D631 Anemia in chronic kidney disease: Secondary | ICD-10-CM | POA: Diagnosis not present

## 2023-11-10 DIAGNOSIS — Z992 Dependence on renal dialysis: Secondary | ICD-10-CM | POA: Diagnosis not present

## 2023-11-10 DIAGNOSIS — N186 End stage renal disease: Secondary | ICD-10-CM | POA: Diagnosis not present

## 2023-11-10 DIAGNOSIS — E1122 Type 2 diabetes mellitus with diabetic chronic kidney disease: Secondary | ICD-10-CM | POA: Diagnosis not present

## 2023-11-10 DIAGNOSIS — D688 Other specified coagulation defects: Secondary | ICD-10-CM | POA: Diagnosis not present

## 2023-11-13 ENCOUNTER — Other Ambulatory Visit: Payer: Self-pay | Admitting: Internal Medicine

## 2023-11-13 DIAGNOSIS — N2581 Secondary hyperparathyroidism of renal origin: Secondary | ICD-10-CM | POA: Diagnosis not present

## 2023-11-13 DIAGNOSIS — B20 Human immunodeficiency virus [HIV] disease: Secondary | ICD-10-CM

## 2023-11-13 DIAGNOSIS — N186 End stage renal disease: Secondary | ICD-10-CM | POA: Diagnosis not present

## 2023-11-13 DIAGNOSIS — D688 Other specified coagulation defects: Secondary | ICD-10-CM | POA: Diagnosis not present

## 2023-11-13 DIAGNOSIS — E1122 Type 2 diabetes mellitus with diabetic chronic kidney disease: Secondary | ICD-10-CM | POA: Diagnosis not present

## 2023-11-13 DIAGNOSIS — D631 Anemia in chronic kidney disease: Secondary | ICD-10-CM | POA: Diagnosis not present

## 2023-11-13 DIAGNOSIS — Z992 Dependence on renal dialysis: Secondary | ICD-10-CM | POA: Diagnosis not present

## 2023-11-14 ENCOUNTER — Telehealth: Payer: Self-pay

## 2023-11-14 DIAGNOSIS — I132 Hypertensive heart and chronic kidney disease with heart failure and with stage 5 chronic kidney disease, or end stage renal disease: Secondary | ICD-10-CM | POA: Diagnosis not present

## 2023-11-14 DIAGNOSIS — I5042 Chronic combined systolic (congestive) and diastolic (congestive) heart failure: Secondary | ICD-10-CM | POA: Diagnosis not present

## 2023-11-14 DIAGNOSIS — L97311 Non-pressure chronic ulcer of right ankle limited to breakdown of skin: Secondary | ICD-10-CM | POA: Diagnosis not present

## 2023-11-14 DIAGNOSIS — L97321 Non-pressure chronic ulcer of left ankle limited to breakdown of skin: Secondary | ICD-10-CM | POA: Diagnosis not present

## 2023-11-14 DIAGNOSIS — I87313 Chronic venous hypertension (idiopathic) with ulcer of bilateral lower extremity: Secondary | ICD-10-CM | POA: Diagnosis not present

## 2023-11-14 DIAGNOSIS — E1122 Type 2 diabetes mellitus with diabetic chronic kidney disease: Secondary | ICD-10-CM | POA: Diagnosis not present

## 2023-11-14 NOTE — Telephone Encounter (Signed)
Spoke to the patient and he was trying to call [phone] to change his appointment at [facility] on 2/17.  I transferred him to Mirna Mires who was able to help him

## 2023-11-14 NOTE — Telephone Encounter (Signed)
Spoke w/ pt. "Does not have a defibrillator." Upset that he has our number and noone else can answer his question about who called him. No telephone notes documented. Upset with someone he spoke w/ recently; "that stupid heffer." Requests call back from manager. Given to [employer]. Pt stating he can only come on Tuesdays and Thursdays and his 2/17 appointment is on a Monday. Sent to scheduling.

## 2023-11-15 ENCOUNTER — Other Ambulatory Visit: Payer: Self-pay

## 2023-11-15 ENCOUNTER — Other Ambulatory Visit (HOSPITAL_COMMUNITY): Payer: Self-pay

## 2023-11-15 DIAGNOSIS — N2581 Secondary hyperparathyroidism of renal origin: Secondary | ICD-10-CM | POA: Diagnosis not present

## 2023-11-15 DIAGNOSIS — Z992 Dependence on renal dialysis: Secondary | ICD-10-CM | POA: Diagnosis not present

## 2023-11-15 DIAGNOSIS — D688 Other specified coagulation defects: Secondary | ICD-10-CM | POA: Diagnosis not present

## 2023-11-15 DIAGNOSIS — N186 End stage renal disease: Secondary | ICD-10-CM | POA: Diagnosis not present

## 2023-11-15 DIAGNOSIS — D631 Anemia in chronic kidney disease: Secondary | ICD-10-CM | POA: Diagnosis not present

## 2023-11-15 DIAGNOSIS — E1122 Type 2 diabetes mellitus with diabetic chronic kidney disease: Secondary | ICD-10-CM | POA: Diagnosis not present

## 2023-11-15 NOTE — Progress Notes (Signed)
Specialty Pharmacy Refill Coordination Note  Jacob [name] is a 73 y.o. male contacted today regarding refills of specialty medication(s) Octreotide Acetate (SandoSTATIN [place])   Patient requested Courier to Provider Office   Delivery date: 11/20/23   Verified address: [facility] [address] [place], [place], [address]   Medication will be filled on 01.24.25.

## 2023-11-16 ENCOUNTER — Other Ambulatory Visit (HOSPITAL_COMMUNITY): Payer: Self-pay

## 2023-11-17 ENCOUNTER — Other Ambulatory Visit: Payer: Self-pay

## 2023-11-17 DIAGNOSIS — N2581 Secondary hyperparathyroidism of renal origin: Secondary | ICD-10-CM | POA: Diagnosis not present

## 2023-11-17 DIAGNOSIS — L97321 Non-pressure chronic ulcer of left ankle limited to breakdown of skin: Secondary | ICD-10-CM | POA: Diagnosis not present

## 2023-11-17 DIAGNOSIS — L97311 Non-pressure chronic ulcer of right ankle limited to breakdown of skin: Secondary | ICD-10-CM | POA: Diagnosis not present

## 2023-11-17 DIAGNOSIS — I87313 Chronic venous hypertension (idiopathic) with ulcer of bilateral lower extremity: Secondary | ICD-10-CM | POA: Diagnosis not present

## 2023-11-17 DIAGNOSIS — Z992 Dependence on renal dialysis: Secondary | ICD-10-CM | POA: Diagnosis not present

## 2023-11-17 DIAGNOSIS — D688 Other specified coagulation defects: Secondary | ICD-10-CM | POA: Diagnosis not present

## 2023-11-17 DIAGNOSIS — D631 Anemia in chronic kidney disease: Secondary | ICD-10-CM | POA: Diagnosis not present

## 2023-11-17 DIAGNOSIS — N186 End stage renal disease: Secondary | ICD-10-CM | POA: Diagnosis not present

## 2023-11-17 DIAGNOSIS — E1122 Type 2 diabetes mellitus with diabetic chronic kidney disease: Secondary | ICD-10-CM | POA: Diagnosis not present

## 2023-11-20 DIAGNOSIS — N2581 Secondary hyperparathyroidism of renal origin: Secondary | ICD-10-CM | POA: Diagnosis not present

## 2023-11-20 DIAGNOSIS — D688 Other specified coagulation defects: Secondary | ICD-10-CM | POA: Diagnosis not present

## 2023-11-20 DIAGNOSIS — E1122 Type 2 diabetes mellitus with diabetic chronic kidney disease: Secondary | ICD-10-CM | POA: Diagnosis not present

## 2023-11-20 DIAGNOSIS — N186 End stage renal disease: Secondary | ICD-10-CM | POA: Diagnosis not present

## 2023-11-20 DIAGNOSIS — Z992 Dependence on renal dialysis: Secondary | ICD-10-CM | POA: Diagnosis not present

## 2023-11-20 DIAGNOSIS — D631 Anemia in chronic kidney disease: Secondary | ICD-10-CM | POA: Diagnosis not present

## 2023-11-21 DIAGNOSIS — I5042 Chronic combined systolic (congestive) and diastolic (congestive) heart failure: Secondary | ICD-10-CM | POA: Diagnosis not present

## 2023-11-21 DIAGNOSIS — E1122 Type 2 diabetes mellitus with diabetic chronic kidney disease: Secondary | ICD-10-CM | POA: Diagnosis not present

## 2023-11-21 DIAGNOSIS — I132 Hypertensive heart and chronic kidney disease with heart failure and with stage 5 chronic kidney disease, or end stage renal disease: Secondary | ICD-10-CM | POA: Diagnosis not present

## 2023-11-21 DIAGNOSIS — L97311 Non-pressure chronic ulcer of right ankle limited to breakdown of skin: Secondary | ICD-10-CM | POA: Diagnosis not present

## 2023-11-21 DIAGNOSIS — I87313 Chronic venous hypertension (idiopathic) with ulcer of bilateral lower extremity: Secondary | ICD-10-CM | POA: Diagnosis not present

## 2023-11-21 DIAGNOSIS — L97321 Non-pressure chronic ulcer of left ankle limited to breakdown of skin: Secondary | ICD-10-CM | POA: Diagnosis not present

## 2023-11-22 DIAGNOSIS — E1122 Type 2 diabetes mellitus with diabetic chronic kidney disease: Secondary | ICD-10-CM | POA: Diagnosis not present

## 2023-11-22 DIAGNOSIS — N186 End stage renal disease: Secondary | ICD-10-CM | POA: Diagnosis not present

## 2023-11-22 DIAGNOSIS — N2581 Secondary hyperparathyroidism of renal origin: Secondary | ICD-10-CM | POA: Diagnosis not present

## 2023-11-22 DIAGNOSIS — D631 Anemia in chronic kidney disease: Secondary | ICD-10-CM | POA: Diagnosis not present

## 2023-11-22 DIAGNOSIS — D688 Other specified coagulation defects: Secondary | ICD-10-CM | POA: Diagnosis not present

## 2023-11-22 DIAGNOSIS — Z992 Dependence on renal dialysis: Secondary | ICD-10-CM | POA: Diagnosis not present

## 2023-11-22 NOTE — Progress Notes (Unsigned)
Cardiology Office Note    Patient Name: Jacob [name] Date of Encounter: 11/22/2023  Primary Care Provider:  [name], MD Primary Cardiologist:  Charlton Haws, MD Primary Electrophysiologist: None   Past Medical History    Past Medical History:  Diagnosis Date   Acute respiratory failure (HCC) 03/01/2018   Anemia    Arthritis    "all over; mostly knees and back" (02/28/2018)   Chronic combined systolic and diastolic CHF (congestive heart failure) (HCC)    Chronic lower back pain    stenosis   Community acquired pneumonia 09/06/2013   COPD (chronic obstructive pulmonary disease) (HCC)    Coronary atherosclerosis of native coronary artery    a. 02/2003 s/p CABG x 2 (VG->RI, VG->RPDA; b. 11/2019 PCI: LM nl, LAD 90d, D3 50, RI 100, LCX 100p, OM3 100 - fills via L->L collats from D2/dLAD, RCA 100p, VG->RPDA ok, VG->RI 95 (3.5x48 Synergy XD DES).   Drug abuse (HCC)    hx; tested for cocaine as recently as 2/08. says he is not using drugs now - avoided defib. for this reason    ESRD (end stage renal disease) (HCC)    [facility] M-W-F- [facility]   Fall at home 10/2020   GERD (gastroesophageal reflux disease)    takes OTC meds as needed   GI bleeding    a. 11/2019 EGD: angiodysplastic lesions w/ bleeding s/p argon plasma/clipping/epi inj. Multiple admissions for the same.   Glaucoma    uses eye drops daily   Hepatitis B 1968   "tx'd w/isolation; caught it from toilet stools in gym"   History of blood transfusion 03/01/2019   History of colon polyps    benign   History of gout    takes Allopurinol daily as well as Colchicine-if needed (02/28/2018)   History of kidney stones    HTN (hypertension)    takes Coreg,Imdur.and Apresoline daily   Human immunodeficiency virus (HIV) disease (HCC) dx'd 1995   on Biktarvy as of 12/2020.     Hyperlipidemia    Ischemic cardiomyopathy    a. 01/2019 Echo: EF 40-45%, diffuse HK, mild basal septal hypertrophy. Diast dysfxn. Nl RV size/fxn. Sev dil  LA. Triv MR/TR/PR.   Muscle spasm    takes Zanaflex as needed   Myocardial infarction (HCC) ~ 2004/2005   Nocturia    Peripheral neuropathy    takes gabapentin daily   Pneumonia    "at least twice" (02/28/2018)   SDH (subdural hematoma) (HCC)    Syphilis, unspecified    Type II diabetes mellitus (HCC) 2004   Lantus daily.Average fasting blood sugar 125-199   Wears glasses    Wears partial dentures     History of Present Illness  Jacob [name] is a 73 y.o. male with a PMH of CAD, s/p CABG x 2 2004, ischemic cardiomyopathy, DM type II HTN, HL, HIV, hepatitis C, AVM malformations and GI bleed, ESRD (Monday, Wednesday, Friday), tobacco abuse, prior cocaine use, RBBB, peripheral neuropathy, GERD  Jacob [name] was previously followed by Dr. Daleen Squibb and establish care with Dr. Eden Emms in 2015.  He had a cardiac MRI completed that showed decreased EF and was referred to EP and seen by Dr. Graciela Husbands for possible AICD but patient declined.  In 2016 showed no ischemia but large inferior scar of 24%.  He was seen by Dr. Eden Emms on 11/2019 with complaint of shortness of breath and that showed an SVG to ramus that was treated with PCI/DES x 1 and 2D echo  completed showing EF of 40-45% with grade 2 DD and moderately dilated LV, moderate LVH with normal PASP, mild MR and moderately dilated LAE.  He was last seen by Jari Favre, PA on 08/09/2022 and reported no chest pain was hypoglycemic during his visit.  Jacob [name] presents today for overdue follow-up and presents with shortness of breath that has been occurring for the past six months. The shortness of breath is particularly noticeable during conversation, causing the patient to feel dizzy and needing to catch their breath. The patient denies any chest pain or pressure. In addition to the respiratory symptoms, the patient also reports issues with their leg. The patient describes swelling and pain in the knee, which has been partially managed with cortisone injections.  The patient also reports numbness and tingling in the toes of the affected leg. The patient is scheduled for further testing to assess blood flow in the leg.The patient is a current smoker, smoking about half a pack a day. The patient also has a history of pneumonia, which was treated a couple of months ago. The patient reports occasional coughing up of phlegm, but denies any fever or chills.  The patient is currently on dialysis and has been experiencing issues with fluid management. The patient reports that they have been careful with their fluid intake and their weight has remained stable.  Patient denies chest pain, palpitations, dyspnea, PND, orthopnea, nausea, vomiting, dizziness, syncope, edema, weight gain, or early satiety.   Review of Systems  Please see the history of present illness.    All other systems reviewed and are otherwise negative except as noted above.  Physical Exam    Wt Readings from Last 3 Encounters:  10/31/23 175 lb (79.4 kg)  10/01/23 175 lb (79.4 kg)  09/20/23 196 lb (88.9 kg)   NW:GNFAO were no vitals filed for this visit.,There is no height or weight on file to calculate BMI. GEN: Well nourished, well developed in no acute distress Neck: No JVD; No carotid bruits Pulmonary: Clear to auscultation without rales, wheezing or rhonchi  Cardiovascular: Normal rate. Regular rhythm. Normal S1. Normal S2.   Murmurs: There is no murmur.  ABDOMEN: Soft, non-tender, non-distended EXTREMITIES:  No edema; No deformity   EKG/LABS/ Recent Cardiac Studies   ECG personally reviewed by me today -none completed today  Risk Assessment/Calculations:          Lab Results  Component Value Date   WBC 5.7 10/01/2023   HGB 11.6 (L) 10/01/2023   HCT 34.0 (L) 10/01/2023   MCV 108.5 (H) 10/01/2023   PLT 126 (L) 10/01/2023   Lab Results  Component Value Date   CREATININE 9.97 (H) 10/01/2023   BUN 47 (H) 10/01/2023   NA 135 10/01/2023   K 5.5 (H) 10/01/2023   CL 96 (L)  10/01/2023   CO2 26 10/01/2023   Lab Results  Component Value Date   CHOL 154 02/24/2022   HDL 34 (L) 02/24/2022   LDLCALC 80 02/24/2022   TRIG 332 (H) 02/24/2022   CHOLHDL 4.5 02/24/2022    Lab Results  Component Value Date   HGBA1C 6.8 (H) 06/15/2023   Assessment & Plan    1.  Coronary artery disease: -History of CABG in 2004 and stent placement in 2021. No current chest pain. Shortness of breath and dizziness during conversation for the past 6 months. -Patient reports increased shortness of breath with and without exertion. -Order Lexiscan stress test to assess for ischemia.  2.  Chronic systolic  CHF: -Patient's last 2D echo completed 2022 with 5% mildly decreased LV function -Patient is euvolemic on exam -Currently on ACE/ARB/MRA due to renal history  3.  Essential hypertension: -Patient blood pressure today was stable at 100/60 -Continue carvedilol 3.125 mg twice daily  4.  ESRD: -On dialysis (Monday, Wednesday, Friday) -Continue current dialysis schedule. -Advise patient to monitor fluid intake between dialysis sessions.  5.  Hyperlipidemia: -Patient's LDL cholesterol was 80 -Continue Lipitor 10 mg daily  6.  History of GI Bleeds: -Patient reports no recurrence with hemoglobin stable at 11.6  7.  History of HIV: -Stable on current regimen with Biktarvy 50-200-25 mg  Disposition: Follow-up with Charlton Haws, MD or APP in 6 months Informed Consent   Shared Decision Making/Informed Consent The risks [chest pain, shortness of breath, cardiac arrhythmias, dizziness, blood pressure fluctuations, myocardial infarction, stroke/transient ischemic attack, nausea, vomiting, allergic reaction, radiation exposure, metallic taste sensation and life-threatening complications (estimated to be 1 in 10,000)], benefits (risk stratification, diagnosing coronary artery disease, treatment guidance) and alternatives of a nuclear stress test were discussed in detail with Jacob [name] and  he agrees to proceed.      Signed, Napoleon Form, Leodis Rains, NP 11/22/2023, 1:35 PM [facility]

## 2023-11-23 ENCOUNTER — Encounter (HOSPITAL_COMMUNITY): Payer: 59

## 2023-11-23 ENCOUNTER — Ambulatory Visit
Admission: RE | Admit: 2023-11-23 | Discharge: 2023-11-23 | Disposition: A | Discharge status: Home / Self Care | Payer: 59 | Source: Ambulatory Visit | Attending: Gastroenterology | Admitting: Gastroenterology

## 2023-11-23 ENCOUNTER — Ambulatory Visit: Payer: 59 | Admitting: Nurse Practitioner

## 2023-11-23 ENCOUNTER — Encounter: Payer: Self-pay | Admitting: Nurse Practitioner

## 2023-11-23 VITALS — BP 100/60 | HR 77 | Ht 74.0 in | Wt 196.0 lb

## 2023-11-23 DIAGNOSIS — Z21 Asymptomatic human immunodeficiency virus [HIV] infection status: Secondary | ICD-10-CM | POA: Insufficient documentation

## 2023-11-23 DIAGNOSIS — I2510 Atherosclerotic heart disease of native coronary artery without angina pectoris: Secondary | ICD-10-CM | POA: Diagnosis not present

## 2023-11-23 DIAGNOSIS — N186 End stage renal disease: Secondary | ICD-10-CM | POA: Insufficient documentation

## 2023-11-23 DIAGNOSIS — Q2730 Arteriovenous malformation, site unspecified: Secondary | ICD-10-CM | POA: Insufficient documentation

## 2023-11-23 DIAGNOSIS — Z992 Dependence on renal dialysis: Secondary | ICD-10-CM

## 2023-11-23 DIAGNOSIS — R0602 Shortness of breath: Secondary | ICD-10-CM | POA: Insufficient documentation

## 2023-11-23 DIAGNOSIS — D649 Anemia, unspecified: Secondary | ICD-10-CM | POA: Diagnosis not present

## 2023-11-23 DIAGNOSIS — I10 Essential (primary) hypertension: Secondary | ICD-10-CM | POA: Insufficient documentation

## 2023-11-23 DIAGNOSIS — E785 Hyperlipidemia, unspecified: Secondary | ICD-10-CM | POA: Insufficient documentation

## 2023-11-23 DIAGNOSIS — I5022 Chronic systolic (congestive) heart failure: Secondary | ICD-10-CM

## 2023-11-23 MED ORDER — OCTREOTIDE ACETATE 10 MG IM KIT
10.0000 mg | PACK | INTRAMUSCULAR | Status: DC
  Administered 2023-11-23: 10 mg via INTRAMUSCULAR
  Filled 2023-11-23: qty 1

## 2023-11-23 MED ORDER — ATORVASTATIN CALCIUM 10 MG PO TABS: ORAL_TABLET | ORAL | 3 refills | Status: DC

## 2023-11-23 NOTE — Patient Instructions (Signed)
Medication Instructions:  Your physician recommends that you continue on your current medications as directed. Please refer to the Current Medication list given to you today.  *If you need a refill on your cardiac medications before your next appointment, please call your pharmacy*   Lab Work: None ordered  If you have labs (blood work) drawn today and your tests are completely normal, you will receive your results only by: MyChart Message (if you have MyChart) OR A paper copy in the mail If you have any lab test that is abnormal or we need to change your treatment, we will call you to review the results.   Testing/Procedures: Your physician has requested that you have a lexiscan myoview. For further information please visit [email or web address] Please follow instruction sheet, BELOW:    You are scheduled for a Myocardial Perfusion Imaging Study.   Please arrive 15 minutes prior to your appointment time for registration and insurance purposes.  The test will take approximately 3 to 4 hours to complete; you may bring reading material.  If someone comes with you to your appointment, they will need to remain in the main lobby due to limited space in the testing area. **If you are pregnant or breastfeeding, please notify the nuclear lab prior to your appointment**  How to prepare for your Myocardial Perfusion Test: Do not eat or drink 3 hours prior to your test, except you may have water. Do not consume products containing caffeine (regular or decaffeinated) 12 hours prior to your test. (ex: coffee, chocolate, sodas, tea). Do bring a list of your current medications with you.  If not listed below, you may take your medications as normal. Do wear comfortable clothes (no dresses or overalls) and walking shoes, tennis shoes preferred (No heels or open toe shoes are allowed). Do NOT wear cologne, perfume, aftershave, or lotions (deodorant is allowed). If these instructions are not followed,  your test will have to be rescheduled.     Follow-Up: At [facility], you and your health needs are our priority.  As part of our continuing mission to provide you with exceptional heart care, we have created designated Provider Care Teams.  These Care Teams include your primary Cardiologist (physician) and Advanced Practice Providers (APPs -  Physician Assistants and Nurse Practitioners) who all work together to provide you with the care you need, when you need it.  We recommend signing up for the patient portal called "MyChart".  Sign up information is provided on this After Visit Summary.  MyChart is used to connect with patients for Virtual Visits (Telemedicine).  Patients are able to view lab/test results, encounter notes, upcoming appointments, etc.  Non-urgent messages can be sent to your provider as well.   To learn more about what you can do with MyChart, go to [email or web address].    Your next appointment:   6 month(s)  Provider:   Charlton Haws, MD  or Robin Searing, NP         Other Instructions   1st Floor: - Lobby - Registration  - Pharmacy  - Lab - Cafe  2nd Floor: - PV Lab - Diagnostic Testing (echo, CT, nuclear med)  3rd Floor: - Vacant  4th Floor: - TCTS (cardiothoracic surgery) - AFib Clinic - Structural Heart Clinic - Vascular Surgery  - Vascular Ultrasound  5th Floor: - HeartCare Cardiology (general and EP) - Clinical Pharmacy for coumadin, hypertension, lipid, weight-loss medications, and med management appointments    Valet parking services will be available as  well.

## 2023-11-24 ENCOUNTER — Other Ambulatory Visit (HOSPITAL_COMMUNITY): Payer: Self-pay | Admitting: Specialist

## 2023-11-24 DIAGNOSIS — M79662 Pain in left lower leg: Secondary | ICD-10-CM

## 2023-11-24 DIAGNOSIS — D631 Anemia in chronic kidney disease: Secondary | ICD-10-CM | POA: Diagnosis not present

## 2023-11-24 DIAGNOSIS — E1122 Type 2 diabetes mellitus with diabetic chronic kidney disease: Secondary | ICD-10-CM | POA: Diagnosis not present

## 2023-11-24 DIAGNOSIS — D688 Other specified coagulation defects: Secondary | ICD-10-CM | POA: Diagnosis not present

## 2023-11-24 DIAGNOSIS — N186 End stage renal disease: Secondary | ICD-10-CM | POA: Diagnosis not present

## 2023-11-24 DIAGNOSIS — Z992 Dependence on renal dialysis: Secondary | ICD-10-CM | POA: Diagnosis not present

## 2023-11-24 DIAGNOSIS — I129 Hypertensive chronic kidney disease with stage 1 through stage 4 chronic kidney disease, or unspecified chronic kidney disease: Secondary | ICD-10-CM | POA: Diagnosis not present

## 2023-11-24 DIAGNOSIS — N2581 Secondary hyperparathyroidism of renal origin: Secondary | ICD-10-CM | POA: Diagnosis not present

## 2023-11-24 DIAGNOSIS — M25562 Pain in left knee: Secondary | ICD-10-CM

## 2023-11-24 DIAGNOSIS — I739 Peripheral vascular disease, unspecified: Secondary | ICD-10-CM

## 2023-11-26 ENCOUNTER — Other Ambulatory Visit: Payer: Self-pay | Admitting: Cardiovascular Disease

## 2023-11-26 DIAGNOSIS — R531 Weakness: Secondary | ICD-10-CM | POA: Diagnosis not present

## 2023-11-26 DIAGNOSIS — R001 Bradycardia, unspecified: Secondary | ICD-10-CM | POA: Diagnosis not present

## 2023-11-26 DIAGNOSIS — R42 Dizziness and giddiness: Secondary | ICD-10-CM | POA: Diagnosis not present

## 2023-11-27 ENCOUNTER — Other Ambulatory Visit: Payer: Self-pay | Admitting: *Deleted

## 2023-11-27 DIAGNOSIS — Z992 Dependence on renal dialysis: Secondary | ICD-10-CM | POA: Diagnosis not present

## 2023-11-27 DIAGNOSIS — N186 End stage renal disease: Secondary | ICD-10-CM | POA: Diagnosis not present

## 2023-11-27 DIAGNOSIS — D631 Anemia in chronic kidney disease: Secondary | ICD-10-CM | POA: Diagnosis not present

## 2023-11-27 DIAGNOSIS — R197 Diarrhea, unspecified: Secondary | ICD-10-CM | POA: Diagnosis not present

## 2023-11-27 DIAGNOSIS — N2581 Secondary hyperparathyroidism of renal origin: Secondary | ICD-10-CM | POA: Diagnosis not present

## 2023-11-27 DIAGNOSIS — D688 Other specified coagulation defects: Secondary | ICD-10-CM | POA: Diagnosis not present

## 2023-11-27 MED ORDER — FOLIC ACID 1 MG PO TABS: ORAL_TABLET | ORAL | 1 refills | Status: DC

## 2023-11-27 NOTE — Telephone Encounter (Signed)
 Pharmacy requested refill

## 2023-11-28 ENCOUNTER — Ambulatory Visit: Payer: Self-pay

## 2023-11-28 DIAGNOSIS — I87313 Chronic venous hypertension (idiopathic) with ulcer of bilateral lower extremity: Secondary | ICD-10-CM | POA: Diagnosis not present

## 2023-11-28 DIAGNOSIS — L97311 Non-pressure chronic ulcer of right ankle limited to breakdown of skin: Secondary | ICD-10-CM | POA: Diagnosis not present

## 2023-11-28 DIAGNOSIS — I5042 Chronic combined systolic (congestive) and diastolic (congestive) heart failure: Secondary | ICD-10-CM | POA: Diagnosis not present

## 2023-11-28 DIAGNOSIS — L97321 Non-pressure chronic ulcer of left ankle limited to breakdown of skin: Secondary | ICD-10-CM | POA: Diagnosis not present

## 2023-11-28 DIAGNOSIS — E1122 Type 2 diabetes mellitus with diabetic chronic kidney disease: Secondary | ICD-10-CM | POA: Diagnosis not present

## 2023-11-28 DIAGNOSIS — I132 Hypertensive heart and chronic kidney disease with heart failure and with stage 5 chronic kidney disease, or end stage renal disease: Secondary | ICD-10-CM | POA: Diagnosis not present

## 2023-11-28 NOTE — Patient Outreach (Signed)
  Care Coordination   11/28/2023 Name: Jacob [name] MRN: [id number] DOB: 09/03/1951   Care Coordination Outreach Attempts:  An unsuccessful outreach was attempted for an appointment today.  Follow Up Plan:  Additional outreach attempts will be made to offer the patient complex care management information and services.   Encounter Outcome:  No Answer   Care Coordination Interventions:  No, not indicated    Clayborne Ly RN BSN CCM [place]  [facility], [facility] Nurse Care Coordinator  Direct Dial : [phone] Website: [email or web address]

## 2023-11-29 DIAGNOSIS — R197 Diarrhea, unspecified: Secondary | ICD-10-CM | POA: Diagnosis not present

## 2023-11-29 DIAGNOSIS — D688 Other specified coagulation defects: Secondary | ICD-10-CM | POA: Diagnosis not present

## 2023-11-29 DIAGNOSIS — Z992 Dependence on renal dialysis: Secondary | ICD-10-CM | POA: Diagnosis not present

## 2023-11-29 DIAGNOSIS — N186 End stage renal disease: Secondary | ICD-10-CM | POA: Diagnosis not present

## 2023-11-29 DIAGNOSIS — N2581 Secondary hyperparathyroidism of renal origin: Secondary | ICD-10-CM | POA: Diagnosis not present

## 2023-11-29 DIAGNOSIS — D631 Anemia in chronic kidney disease: Secondary | ICD-10-CM | POA: Diagnosis not present

## 2023-11-30 ENCOUNTER — Ambulatory Visit (HOSPITAL_COMMUNITY)
Admission: RE | Admit: 2023-11-30 | Discharge: 2023-11-30 | Disposition: A | Discharge status: Home / Self Care | Payer: 59 | Source: Ambulatory Visit | Attending: Cardiovascular Disease | Admitting: Cardiovascular Disease

## 2023-11-30 DIAGNOSIS — M79662 Pain in left lower leg: Secondary | ICD-10-CM | POA: Insufficient documentation

## 2023-11-30 DIAGNOSIS — M25562 Pain in left knee: Secondary | ICD-10-CM | POA: Diagnosis not present

## 2023-12-01 DIAGNOSIS — R197 Diarrhea, unspecified: Secondary | ICD-10-CM | POA: Diagnosis not present

## 2023-12-01 DIAGNOSIS — N2581 Secondary hyperparathyroidism of renal origin: Secondary | ICD-10-CM | POA: Diagnosis not present

## 2023-12-01 DIAGNOSIS — N186 End stage renal disease: Secondary | ICD-10-CM | POA: Diagnosis not present

## 2023-12-01 DIAGNOSIS — D631 Anemia in chronic kidney disease: Secondary | ICD-10-CM | POA: Diagnosis not present

## 2023-12-01 DIAGNOSIS — D688 Other specified coagulation defects: Secondary | ICD-10-CM | POA: Diagnosis not present

## 2023-12-01 DIAGNOSIS — Z992 Dependence on renal dialysis: Secondary | ICD-10-CM | POA: Diagnosis not present

## 2023-12-04 DIAGNOSIS — D631 Anemia in chronic kidney disease: Secondary | ICD-10-CM | POA: Diagnosis not present

## 2023-12-04 DIAGNOSIS — N2581 Secondary hyperparathyroidism of renal origin: Secondary | ICD-10-CM | POA: Diagnosis not present

## 2023-12-04 DIAGNOSIS — R197 Diarrhea, unspecified: Secondary | ICD-10-CM | POA: Diagnosis not present

## 2023-12-04 DIAGNOSIS — Z992 Dependence on renal dialysis: Secondary | ICD-10-CM | POA: Diagnosis not present

## 2023-12-04 DIAGNOSIS — D688 Other specified coagulation defects: Secondary | ICD-10-CM | POA: Diagnosis not present

## 2023-12-04 DIAGNOSIS — N186 End stage renal disease: Secondary | ICD-10-CM | POA: Diagnosis not present

## 2023-12-05 ENCOUNTER — Other Ambulatory Visit: Payer: Self-pay

## 2023-12-05 DIAGNOSIS — L97321 Non-pressure chronic ulcer of left ankle limited to breakdown of skin: Secondary | ICD-10-CM | POA: Diagnosis not present

## 2023-12-05 DIAGNOSIS — I87313 Chronic venous hypertension (idiopathic) with ulcer of bilateral lower extremity: Secondary | ICD-10-CM | POA: Diagnosis not present

## 2023-12-05 DIAGNOSIS — I5042 Chronic combined systolic (congestive) and diastolic (congestive) heart failure: Secondary | ICD-10-CM | POA: Diagnosis not present

## 2023-12-05 DIAGNOSIS — I132 Hypertensive heart and chronic kidney disease with heart failure and with stage 5 chronic kidney disease, or end stage renal disease: Secondary | ICD-10-CM | POA: Diagnosis not present

## 2023-12-05 DIAGNOSIS — E1122 Type 2 diabetes mellitus with diabetic chronic kidney disease: Secondary | ICD-10-CM | POA: Diagnosis not present

## 2023-12-05 DIAGNOSIS — L97311 Non-pressure chronic ulcer of right ankle limited to breakdown of skin: Secondary | ICD-10-CM | POA: Diagnosis not present

## 2023-12-05 MED ORDER — OXYCODONE-ACETAMINOPHEN 5-325 MG PO TABS: 1.0000 | ORAL_TABLET | Freq: Three times a day (TID) | ORAL | 0 refills | Status: DC | PRN

## 2023-12-05 NOTE — Telephone Encounter (Signed)
Patient is requesting a refill of the following medications: Requested Prescriptions   Pending Prescriptions Disp Refills   oxyCODONE-acetaminophen (PERCOCET) 5-325 MG tablet 90 tablet 0    Sig: Take 1 tablet by mouth every 8 (eight) hours as needed for severe pain (pain score 7-10).    Date of last refill: 11/07/23  Refill amount: 90  Treatment agreement date: No treatment agreement on file, notation made on pending appointment 12/26/23

## 2023-12-06 DIAGNOSIS — N186 End stage renal disease: Secondary | ICD-10-CM | POA: Diagnosis not present

## 2023-12-06 DIAGNOSIS — D631 Anemia in chronic kidney disease: Secondary | ICD-10-CM | POA: Diagnosis not present

## 2023-12-06 DIAGNOSIS — Z992 Dependence on renal dialysis: Secondary | ICD-10-CM | POA: Diagnosis not present

## 2023-12-06 DIAGNOSIS — R197 Diarrhea, unspecified: Secondary | ICD-10-CM | POA: Diagnosis not present

## 2023-12-06 DIAGNOSIS — D688 Other specified coagulation defects: Secondary | ICD-10-CM | POA: Diagnosis not present

## 2023-12-06 DIAGNOSIS — N2581 Secondary hyperparathyroidism of renal origin: Secondary | ICD-10-CM | POA: Diagnosis not present

## 2023-12-07 ENCOUNTER — Encounter (HOSPITAL_COMMUNITY)
Admission: RE | Disposition: A | Discharge status: Home / Self Care | Payer: Self-pay | Source: Home / Self Care | Attending: Internal Medicine

## 2023-12-07 ENCOUNTER — Other Ambulatory Visit: Payer: Self-pay

## 2023-12-07 ENCOUNTER — Ambulatory Visit (HOSPITAL_COMMUNITY)
Admission: RE | Admit: 2023-12-07 | Discharge: 2023-12-07 | Disposition: A | Discharge status: Home / Self Care | Payer: 59 | Attending: Internal Medicine | Admitting: Internal Medicine

## 2023-12-07 DIAGNOSIS — I132 Hypertensive heart and chronic kidney disease with heart failure and with stage 5 chronic kidney disease, or end stage renal disease: Secondary | ICD-10-CM | POA: Diagnosis not present

## 2023-12-07 DIAGNOSIS — Z951 Presence of aortocoronary bypass graft: Secondary | ICD-10-CM | POA: Diagnosis not present

## 2023-12-07 DIAGNOSIS — M109 Gout, unspecified: Secondary | ICD-10-CM | POA: Insufficient documentation

## 2023-12-07 DIAGNOSIS — N186 End stage renal disease: Secondary | ICD-10-CM | POA: Diagnosis not present

## 2023-12-07 DIAGNOSIS — I2510 Atherosclerotic heart disease of native coronary artery without angina pectoris: Secondary | ICD-10-CM | POA: Insufficient documentation

## 2023-12-07 DIAGNOSIS — Z21 Asymptomatic human immunodeficiency virus [HIV] infection status: Secondary | ICD-10-CM | POA: Insufficient documentation

## 2023-12-07 DIAGNOSIS — J449 Chronic obstructive pulmonary disease, unspecified: Secondary | ICD-10-CM | POA: Diagnosis not present

## 2023-12-07 DIAGNOSIS — I5042 Chronic combined systolic (congestive) and diastolic (congestive) heart failure: Secondary | ICD-10-CM | POA: Diagnosis not present

## 2023-12-07 DIAGNOSIS — Z79899 Other long term (current) drug therapy: Secondary | ICD-10-CM | POA: Diagnosis not present

## 2023-12-07 DIAGNOSIS — Z992 Dependence on renal dialysis: Secondary | ICD-10-CM | POA: Insufficient documentation

## 2023-12-07 DIAGNOSIS — E1142 Type 2 diabetes mellitus with diabetic polyneuropathy: Secondary | ICD-10-CM | POA: Diagnosis not present

## 2023-12-07 DIAGNOSIS — Z794 Long term (current) use of insulin: Secondary | ICD-10-CM | POA: Diagnosis not present

## 2023-12-07 DIAGNOSIS — E1122 Type 2 diabetes mellitus with diabetic chronic kidney disease: Secondary | ICD-10-CM | POA: Diagnosis not present

## 2023-12-07 DIAGNOSIS — T82858A Stenosis of vascular prosthetic devices, implants and grafts, initial encounter: Secondary | ICD-10-CM | POA: Diagnosis not present

## 2023-12-07 DIAGNOSIS — T82898A Other specified complication of vascular prosthetic devices, implants and grafts, initial encounter: Secondary | ICD-10-CM | POA: Diagnosis not present

## 2023-12-07 DIAGNOSIS — F17210 Nicotine dependence, cigarettes, uncomplicated: Secondary | ICD-10-CM | POA: Diagnosis not present

## 2023-12-07 DIAGNOSIS — Y832 Surgical operation with anastomosis, bypass or graft as the cause of abnormal reaction of the patient, or of later complication, without mention of misadventure at the time of the procedure: Secondary | ICD-10-CM | POA: Insufficient documentation

## 2023-12-07 HISTORY — PX: A/V FISTULAGRAM: CATH118298

## 2023-12-07 LAB — POCT I-STAT, CHEM 8
BUN: 34 mg/dL — ABNORMAL HIGH (ref 8–23)
Calcium, Ion: 1.1 mmol/L — ABNORMAL LOW (ref 1.15–1.40)
Chloride: 100 mmol/L (ref 98–111)
Creatinine, Ser: 8.3 mg/dL — ABNORMAL HIGH (ref 0.61–1.24)
Glucose, Bld: 117 mg/dL — ABNORMAL HIGH (ref 70–99)
HCT: 35 % — ABNORMAL LOW (ref 39.0–52.0)
Hemoglobin: 11.9 g/dL — ABNORMAL LOW (ref 13.0–17.0)
Potassium: 4.3 mmol/L (ref 3.5–5.1)
Sodium: 140 mmol/L (ref 135–145)
TCO2: 29 mmol/L (ref 22–32)

## 2023-12-07 SURGERY — A/V FISTULAGRAM
Anesthesia: LOCAL

## 2023-12-07 MED ORDER — IODIXANOL 320 MG/ML IV SOLN
INTRAVENOUS | Status: DC | PRN
  Administered 2023-12-07: 15 mL

## 2023-12-07 MED ORDER — LIDOCAINE HCL (PF) 1 % IJ SOLN
INTRAMUSCULAR | Status: DC | PRN
  Administered 2023-12-07: 2 mL via INTRADERMAL

## 2023-12-07 MED ORDER — ONDANSETRON HCL 4 MG/2ML IJ SOLN: 4.0000 mg | Freq: Four times a day (QID) | INTRAMUSCULAR | Status: DC | PRN

## 2023-12-07 MED ORDER — HEPARIN (PORCINE) IN NACL 1000-0.9 UT/500ML-% IV SOLN
INTRAVENOUS | Status: DC | PRN
  Administered 2023-12-07: 500 mL

## 2023-12-07 MED ORDER — SODIUM CHLORIDE 0.9 % IV SOLN: INTRAVENOUS | Status: DC

## 2023-12-07 MED ORDER — ACETAMINOPHEN 325 MG PO TABS: 650.0000 mg | ORAL_TABLET | ORAL | Status: DC | PRN

## 2023-12-07 SURGICAL SUPPLY — 5 items
COVER DOME SNAP 22 D (MISCELLANEOUS) ×1 IMPLANT
GUIDEWIRE ANGLED .035 180CM (WIRE) IMPLANT
KIT MICROPUNCTURE NIT STIFF (SHEATH) IMPLANT
SHEATH PINNACLE R/O II 6F 4CM (SHEATH) IMPLANT
TRAY PV CATH (CUSTOM PROCEDURE TRAY) ×1 IMPLANT

## 2023-12-07 NOTE — Op Note (Addendum)
Patient presents with low clearances and prolonged bleeding of his R FAL AVG (A5).  Last intervention was 03/2023 thrombectomy.  On exam he has a decent thrill and bruit with a high pitched quality in the venous limb.     Summary:  1) Successful angiogram of a right upper arm straight AVG. 2) The body of the graft, central veins, and inflow were widely patent. 3) This right upper arm straight graft remains amenable to future percutaneous intervention.   Description of procedure: The right forearm was prepped and draped in the usual fashion. The right forearm loop AVG (A5) was cannulated (13244) in the arterial limb of the graft in an antegrade direction with an 21G micropuncture needle and then a 6 Fr sheath was inserted by guidewire exchange technique. The angiogram revealed a patent body of the graft, patent venous anastomosis . The upper arm basilic vein, central veins and inflow were patent. Flows were rapid.  Hemostasis: A 3-0 ethilon purse string suture was placed at the cannulation site on removal of the sheath.  Sedation: none - pt ate breakfast.  Sedation time: none  Contrast. 10 mL  Monitoring: Because of the patient's comorbid conditions and sedation during the procedure, continuous EKG monitoring and O2 saturation monitoring was performed throughout the procedure by the RN. There were no abnormal arrhythmias encountered.  Complications: None.   Diagnoses: Z99.2 dependence on dialysis T82.898A other complication (prolonged bleeding) N18.6 ESRD   Procedure Coding:  36901 Cannulation and angiogram of fistula W1027 Contrast  Recommendations:  1. Continue to cannulate the fistula with 15G needles.  2. Refer back for problems with flows. 3. Remove the suture next treatment.   Discharge: The patient was discharged home in stable condition. The patient was given education regarding the care of the dialysis access AVF and specific instructions in case of any problems.

## 2023-12-07 NOTE — H&P (Addendum)
[facility]  Vascular Access Procedure H&P  Reason for Consultation: prolonged bleeding, poor clearances Requesting Provider: Dr. Marisue Humble  HPI: Jacob [name] is an 73 y.o. male with ESRD on HD, CHF, COPD, CAD s/p CABG 2004, gout, HIV who presents for evaluation of AVG.   Per review of outpt HD records has had some recent poor clearances and patient reports and episode of prolonged bleeding.  Last angiogram/plasty was 03/2023 with thrombectomy and [facility] angioplasty at [facility].   Ate a full breakfast <4h ago.  Requests to proceed today with local anesthesia alone.   Most recent chemistries from 11/01/23 -- will pull istat chem panel here today prior to procedure. 11/29/23 Hb 11.3, 10/2023 Plt 125 which is within baseline for him.  PMH: Past Medical History:  Diagnosis Date   Acute respiratory failure (HCC) 03/01/2018   Anemia    Arthritis    "all over; mostly knees and back" (02/28/2018)   Chronic combined systolic and diastolic CHF (congestive heart failure) (HCC)    Chronic lower back pain    stenosis   Community acquired pneumonia 09/06/2013   COPD (chronic obstructive pulmonary disease) (HCC)    Coronary atherosclerosis of native coronary artery    a. 02/2003 s/p CABG x 2 (VG->RI, VG->RPDA; b. 11/2019 PCI: LM nl, LAD 90d, D3 50, RI 100, LCX 100p, OM3 100 - fills via L->L collats from D2/dLAD, RCA 100p, VG->RPDA ok, VG->RI 95 (3.5x48 Synergy XD DES).   Drug abuse (HCC)    hx; tested for cocaine as recently as 2/08. says he is not using drugs now - avoided defib. for this reason    ESRD (end stage renal disease) (HCC)    Hemo M-W-F- Valarie Merino   Fall at home 10/2020   GERD (gastroesophageal reflux disease)    takes OTC meds as needed   GI bleeding    a. 11/2019 EGD: angiodysplastic lesions w/ bleeding s/p argon plasma/clipping/epi inj. Multiple admissions for the same.   Glaucoma    uses eye drops daily   Hepatitis B 1968   "tx'd w/isolation; caught it from toilet stools in gym"    History of blood transfusion 03/01/2019   History of colon polyps    benign   History of gout    takes Allopurinol daily as well as Colchicine-if needed (02/28/2018)   History of kidney stones    HTN (hypertension)    takes Coreg,Imdur.and Apresoline daily   Human immunodeficiency virus (HIV) disease (HCC) dx'd 1995   on Biktarvy as of 12/2020.     Hyperlipidemia    Ischemic cardiomyopathy    a. 01/2019 Echo: EF 40-45%, diffuse HK, mild basal septal hypertrophy. Diast dysfxn. Nl RV size/fxn. Sev dil LA. Triv MR/TR/PR.   Muscle spasm    takes Zanaflex as needed   Myocardial infarction (HCC) ~ 2004/2005   Nocturia    Peripheral neuropathy    takes gabapentin daily   Pneumonia    "at least twice" (02/28/2018)   SDH (subdural hematoma) (HCC)    Syphilis, unspecified    Type II diabetes mellitus (HCC) 2004   Lantus daily.Average fasting blood sugar 125-199   Wears glasses    Wears partial dentures    PSH: Past Surgical History:  Procedure Laterality Date   AV FISTULA PLACEMENT Left 08/02/2018   Procedure: ARTERIOVENOUS (AV) FISTULA CREATION  left arm radiocephlic;  Surgeon: Cephus Shelling, MD;  Location: [facility] OR;  Service: Vascular;  Laterality: Left;   AV FISTULA PLACEMENT Left 08/01/2019  Procedure: LEFT BRACHIOCEPHALIC ARTERIOVENOUS (AV) FISTULA CREATION;  Surgeon: Larina Earthly, MD;  Location: [facility] OR;  Service: Vascular;  Laterality: Left;   AV FISTULA PLACEMENT Right 04/12/2022   Procedure: RIGHT UPPER EXTREMITY ARTERIOVENOUS (AV)  GRAFT INSERTION USING GORE STRETCH 4-7 MM;  Surgeon: Chuck Hint, MD;  Location: [facility] OR;  Service: Vascular;  Laterality: Right;   BASCILIC VEIN TRANSPOSITION Left 10/03/2019   Procedure: BASILIC VEIN TRANSPOSITION LEFT SECOND STAGE;  Surgeon: Larina Earthly, MD;  Location: [facility] OR;  Service: Vascular;  Laterality: Left;   BIOPSY  01/25/2021   Procedure: BIOPSY;  Surgeon: Sherrilyn Rist, MD;  Location: [facility] ENDOSCOPY;  Service: Gastroenterology;;    CARDIAC CATHETERIZATION  10/2002; 12/19/2004   Hattie Perch 03/08/2011   COLONOSCOPY  2013   Dr.John Marina Goodell    CORONARY ARTERY BYPASS GRAFT  02/24/2003   CABG X2/notes 03/08/2011   CORONARY STENT INTERVENTION N/A 12/19/2019   Procedure: CORONARY STENT INTERVENTION;  Surgeon: Corky Crafts, MD;  Location: [facility] INVASIVE CV LAB;  Service: Cardiovascular;  Laterality: N/A;   CORONARY ULTRASOUND/IVUS N/A 12/19/2019   Procedure: Intravascular Ultrasound/IVUS;  Surgeon: Corky Crafts, MD;  Location: [facility] INVASIVE CV LAB;  Service: Cardiovascular;  Laterality: N/A;   ENTEROSCOPY N/A 01/25/2021   Procedure: ENTEROSCOPY;  Surgeon: Sherrilyn Rist, MD;  Location: [facility] ENDOSCOPY;  Service: Gastroenterology;  Laterality: N/A;   ENTEROSCOPY N/A 02/13/2021   Procedure: ENTEROSCOPY;  Surgeon: Lynann Bologna, MD;  Location: [facility] ENDOSCOPY;  Service: Endoscopy;  Laterality: N/A;   ENTEROSCOPY N/A 05/07/2021   Procedure: ENTEROSCOPY;  Surgeon: Benancio Deeds, MD;  Location: [facility] ENDOSCOPY;  Service: Gastroenterology;  Laterality: N/A;   ESOPHAGOGASTRODUODENOSCOPY (EGD) WITH PROPOFOL N/A 02/08/2019   Procedure: ESOPHAGOGASTRODUODENOSCOPY (EGD) WITH PROPOFOL;  Surgeon: Rachael Fee, MD;  Location: [facility] ENDOSCOPY;  Service: Gastroenterology;  Laterality: N/A;   ESOPHAGOGASTRODUODENOSCOPY (EGD) WITH PROPOFOL N/A 12/22/2019   Procedure: ESOPHAGOGASTRODUODENOSCOPY (EGD) WITH PROPOFOL;  Surgeon: Shellia Cleverly, DO;  Location: [facility] ENDOSCOPY;  Service: Gastroenterology;  Laterality: N/A;   ESOPHAGOGASTRODUODENOSCOPY (EGD) WITH PROPOFOL N/A 10/19/2020   Procedure: ESOPHAGOGASTRODUODENOSCOPY (EGD) WITH PROPOFOL;  Surgeon: Lynann Bologna, MD;  Location: [facility] ENDOSCOPY;  Service: Endoscopy;  Laterality: N/A;   ESOPHAGOGASTRODUODENOSCOPY (EGD) WITH PROPOFOL N/A 12/22/2020   Procedure: ESOPHAGOGASTRODUODENOSCOPY (EGD) WITH PROPOFOL;  Surgeon: Iva Boop, MD;  Location: [facility] ENDOSCOPY;  Service: Endoscopy;  Laterality: N/A;    ESOPHAGOGASTRODUODENOSCOPY (EGD) WITH PROPOFOL N/A 01/09/2021   Procedure: ESOPHAGOGASTRODUODENOSCOPY (EGD) WITH PROPOFOL;  Surgeon: Hilarie Fredrickson, MD;  Location: [facility] ENDOSCOPY;  Service: Endoscopy;  Laterality: N/A;   HEMOSTASIS CLIP PLACEMENT  12/22/2019   Procedure: HEMOSTASIS CLIP PLACEMENT;  Surgeon: Shellia Cleverly, DO;  Location: [facility] ENDOSCOPY;  Service: Gastroenterology;;   HEMOSTASIS CLIP PLACEMENT  12/22/2020   Procedure: HEMOSTASIS CLIP PLACEMENT;  Surgeon: Iva Boop, MD;  Location: [facility] ENDOSCOPY;  Service: Endoscopy;;   HEMOSTASIS CONTROL  12/22/2020   Procedure: HEMOSTASIS CONTROL/hemospray;  Surgeon: Iva Boop, MD;  Location: [facility] ENDOSCOPY;  Service: Endoscopy;;   HOT HEMOSTASIS N/A 02/08/2019   Procedure: HOT HEMOSTASIS (ARGON PLASMA COAGULATION/BICAP);  Surgeon: Rachael Fee, MD;  Location: [facility] ENDOSCOPY;  Service: Gastroenterology;  Laterality: N/A;   HOT HEMOSTASIS N/A 12/22/2019   Procedure: HOT HEMOSTASIS (ARGON PLASMA COAGULATION/BICAP);  Surgeon: Shellia Cleverly, DO;  Location: [facility] ENDOSCOPY;  Service: Gastroenterology;  Laterality: N/A;   HOT HEMOSTASIS N/A 10/19/2020   Procedure: HOT HEMOSTASIS (ARGON PLASMA COAGULATION/BICAP);  Surgeon: Lynann Bologna, MD;  Location: [facility]  ENDOSCOPY;  Service: Endoscopy;  Laterality: N/A;   HOT HEMOSTASIS N/A 12/22/2020   Procedure: HOT HEMOSTASIS (ARGON PLASMA COAGULATION/BICAP);  Surgeon: Iva Boop, MD;  Location: [facility] ENDOSCOPY;  Service: Endoscopy;  Laterality: N/A;   HOT HEMOSTASIS N/A 01/09/2021   Procedure: HOT HEMOSTASIS (ARGON PLASMA COAGULATION/BICAP);  Surgeon: Hilarie Fredrickson, MD;  Location: [facility] ENDOSCOPY;  Service: Endoscopy;  Laterality: N/A;   HOT HEMOSTASIS N/A 01/25/2021   Procedure: HOT HEMOSTASIS (ARGON PLASMA COAGULATION/BICAP);  Surgeon: Sherrilyn Rist, MD;  Location: [facility] ENDOSCOPY;  Service: Gastroenterology;  Laterality: N/A;   HOT HEMOSTASIS N/A 02/13/2021   Procedure: HOT HEMOSTASIS (ARGON PLASMA  COAGULATION/BICAP);  Surgeon: Lynann Bologna, MD;  Location: [facility] ENDOSCOPY;  Service: Endoscopy;  Laterality: N/A;   HOT HEMOSTASIS N/A 05/07/2021   Procedure: HOT HEMOSTASIS (ARGON PLASMA COAGULATION/BICAP);  Surgeon: Benancio Deeds, MD;  Location: [name] ENDOSCOPY;  Service: Gastroenterology;  Laterality: N/A;   INSERTION OF DIALYSIS CATHETER N/A 02/08/2022   Procedure: INSERTION OF TUNNELED DIALYSIS CATHETER;  Surgeon: Chuck Hint, MD;  Location: [facility] OR;  Service: Vascular;  Laterality: N/A;   INTERTROCHANTERIC HIP FRACTURE SURGERY Left 11/2006   Hattie Perch 03/08/2011   IR FLUORO GUIDE CV LINE LEFT  03/14/2022   IR FLUORO GUIDE CV LINE RIGHT  07/24/2019   IR FLUORO GUIDE CV LINE RIGHT  07/30/2019   IR US GUIDE VASC ACCESS RIGHT  07/24/2019   IR US GUIDE VASC ACCESS RIGHT  07/30/2019   LAPAROSCOPIC CHOLECYSTECTOMY  05/2006   LIGATION OF ARTERIOVENOUS  FISTULA Left 02/08/2022   Procedure: LIGATION OF LEFT ARTERIOVENOUS  FISTULA;  Surgeon: Chuck Hint, MD;  Location: [facility] OR;  Service: Vascular;  Laterality: Left;   LIGATION OF COMPETING BRANCHES OF ARTERIOVENOUS FISTULA Left 11/05/2018   Procedure: LIGATION OF COMPETING BRANCHES OF ARTERIOVENOUS FISTULA  LEFT  ARM;  Surgeon: Cephus Shelling, MD;  Location: [facility] OR;  Service: Vascular;  Laterality: Left;   LUMBAR LAMINECTOMY/DECOMPRESSION MICRODISCECTOMY N/A 02/29/2016   Procedure: Left L4-5 Lateral Recess Decompression, Removal Extradural Intraspinal Facet Cyst;  Surgeon: Eldred Manges, MD;  Location: [facility] OR;  Service: Orthopedics;  Laterality: N/A;   METATARSAL HEAD EXCISION Right 07/20/2022   Procedure: Irragation and debridement of right foot wound with extraction of all necrotic soft tissue and bone;  Surgeon: Pilar Plate, DPM;  Location: [facility] OR;  Service: Podiatry;  Laterality: Right;  Right 4th Met head and base of proximal phalanx resection   MULTIPLE TOOTH EXTRACTIONS     ORIF MANDIBULAR FRACTURE Left 08/13/2004   ORIF  of left body fracture mandible with KLS Martin 2.3-mm six hole/notes 03/08/2011   RIGHT/LEFT HEART CATH AND CORONARY/GRAFT ANGIOGRAPHY N/A 12/19/2019   Procedure: RIGHT/LEFT HEART CATH AND CORONARY/GRAFT ANGIOGRAPHY;  Surgeon: Corky Crafts, MD;  Location: [facility] INVASIVE CV LAB;  Service: Cardiovascular;  Laterality: N/A;   SCLEROTHERAPY  12/22/2019   Procedure: SCLEROTHERAPY;  Surgeon: Shellia Cleverly, DO;  Location: [facility] ENDOSCOPY;  Service: Gastroenterology;;   Susa Day  02/13/2021   Procedure: SCLEROTHERAPY;  Surgeon: Lynann Bologna, MD;  Location: [facility] ENDOSCOPY;  Service: Endoscopy;;   Past Medical History:  Diagnosis Date   Acute respiratory failure (HCC) 03/01/2018   Anemia    Arthritis    "all over; mostly knees and back" (02/28/2018)   Chronic combined systolic and diastolic CHF (congestive heart failure) (HCC)    Chronic lower back pain    stenosis   Community acquired pneumonia 09/06/2013   COPD (chronic obstructive pulmonary disease) (  HCC)    Coronary atherosclerosis of native coronary artery    a. 02/2003 s/p CABG x 2 (VG->RI, VG->RPDA; b. 11/2019 PCI: LM nl, LAD 90d, D3 50, RI 100, LCX 100p, OM3 100 - fills via L->L collats from D2/dLAD, RCA 100p, VG->RPDA ok, VG->RI 95 (3.5x48 Synergy XD DES).   Drug abuse (HCC)    hx; tested for cocaine as recently as 2/08. says he is not using drugs now - avoided defib. for this reason    ESRD (end stage renal disease) (HCC)    Hemo M-W-F- Valarie Merino   Fall at home 10/2020   GERD (gastroesophageal reflux disease)    takes OTC meds as needed   GI bleeding    a. 11/2019 EGD: angiodysplastic lesions w/ bleeding s/p argon plasma/clipping/epi inj. Multiple admissions for the same.   Glaucoma    uses eye drops daily   Hepatitis B 1968   "tx'd w/isolation; caught it from toilet stools in gym"   History of blood transfusion 03/01/2019   History of colon polyps    benign   History of gout    takes Allopurinol daily as well as Colchicine-if  needed (02/28/2018)   History of kidney stones    HTN (hypertension)    takes Coreg,Imdur.and Apresoline daily   Human immunodeficiency virus (HIV) disease (HCC) dx'd 1995   on Biktarvy as of 12/2020.     Hyperlipidemia    Ischemic cardiomyopathy    a. 01/2019 Echo: EF 40-45%, diffuse HK, mild basal septal hypertrophy. Diast dysfxn. Nl RV size/fxn. Sev dil LA. Triv MR/TR/PR.   Muscle spasm    takes Zanaflex as needed   Myocardial infarction (HCC) ~ 2004/2005   Nocturia    Peripheral neuropathy    takes gabapentin daily   Pneumonia    "at least twice" (02/28/2018)   SDH (subdural hematoma) (HCC)    Syphilis, unspecified    Type II diabetes mellitus (HCC) 2004   Lantus daily.Average fasting blood sugar 125-199   Wears glasses    Wears partial dentures     Medications:  I have reviewed the patient's current medications.  Medications Prior to Admission  Medication Sig Dispense Refill   acetaminophen (TYLENOL) 650 MG CR tablet Take 1,300 mg by mouth 3 (three) times daily.     albuterol (PROVENTIL) (2.5 MG/3ML) 0.083% nebulizer solution Take 3 mLs (2.5 mg total) by nebulization every 6 (six) hours as needed for wheezing or shortness of breath. 75 mL 2   allopurinol (ZYLOPRIM) 100 MG tablet TAKE 1 TABLET BY MOUTH DAILY 90 tablet 1   atorvastatin (LIPITOR) 10 MG tablet TAKE 1 TABLET(10 MG) BY MOUTH DAILY. 90 tablet 3   BIKTARVY 50-200-25 MG TABS tablet TAKE 1 TABLET BY MOUTH 1 TIME A DAY 30 tablet 1   Calcium Acetate 667 MG TABS Take 667-2,001 mg by mouth See admin instructions. Take 2001 mg by mouth three times a day with meals and 667 mg with each snack     carvedilol (COREG) 3.125 MG tablet TAKE 1 TABLET(3.125 MG) BY MOUTH TWICE DAILY WITH A MEAL 60 tablet 11   diclofenac Sodium (VOLTAREN) 1 % GEL Apply 2 grams topically to the affected area three times daily as needed for pain. 600 g 1   folic acid (FOLVITE) 1 MG tablet TAKE 1 TABLET(1 MG) BY MOUTH DAILY 90 tablet 1   gabapentin  (NEURONTIN) 300 MG capsule TAKE 1 CAPSULE BY MOUTH DAILY. 90 capsule 2   gentamicin ointment (GARAMYCIN) 0.1 %  Apply 1 Application topically daily. 15 g 0   HUMALOG KWIKPEN 100 UNIT/ML KwikPen INJECT 20 UNITS UNDER THE SKIN 3 TIMES DAILY AS NEEDED FOR HIGH BLOOD SUGAR ABOVE 150 45 mL 3   isosorbide mononitrate (IMDUR) 30 MG 24 hr tablet Take 1 tablet (30 mg total) by mouth daily. 90 tablet 2   latanoprost (XALATAN) 0.005 % ophthalmic solution Place 1 drop into both eyes at bedtime.     lidocaine-prilocaine (EMLA) cream Apply topically as directed. At dialysis     Menthol-Camphor (TIGER BALM ARTHRITIS RUB) 11-11 % CREA Apply 1 Application topically as needed (pain).     midodrine (PROAMATINE) 10 MG tablet Take 10 mg by mouth 3 (three) times a week.     multivitamin (RENA-VIT) TABS tablet Take 1 tablet by mouth daily.     octreotide (SANDOSTATIN LAR DEPOT) 10 MG injection Inject 10 mg into the muscle every 28 days. 1 kit 5   PREVIDENT 5000 PLUS 1.1 % CREA dental cream Place 1 Application onto teeth at bedtime.     timolol (TIMOPTIC) 0.5 % ophthalmic solution Place 1 drop into both eyes in the morning.     umeclidinium-vilanterol (ANORO ELLIPTA) 62.5-25 MCG/ACT AEPB Inhale 1 puff into the lungs daily. 60 each 11   B-D UF III MINI PEN NEEDLES 31G X 5 MM MISC USE 4 TIMES DAILY 100 each 11   glucose blood (ONETOUCH ULTRA) test strip Check blood sugar three times daily E11.40 300 strip 3   Lancets (ONETOUCH DELICA PLUS LANCET33G) MISC USE 3 TIMES DAILY 300 each 3   Methoxy PEG-Epoetin Beta (MIRCERA IJ) as directed. At Dialysis     oxyCODONE-acetaminophen (PERCOCET) 5-325 MG tablet Take 1 tablet by mouth every 8 (eight) hours as needed for severe pain (pain score 7-10). 90 tablet 0    ALLERGIES:   Allergies  Allergen Reactions   Augmentin [Amoxicillin-Pot Clavulanate] Diarrhea and Other (See Comments)    Severe diarrhea   Morphine Sulfate Anaphylaxis    Can't mix with oxycodone    Mucinex  Fast-Max Other (See Comments)    Intense sweating    Amphetamines Other (See Comments)    Unknown reaction    FAM HX: Family History  Problem Relation Age of Onset   Heart failure Father    Hypertension Father    Diabetes Brother    Heart attack Brother    Alzheimer's disease Mother    Stroke Sister    Diabetes Sister    Alzheimer's disease Sister    Hypertension Brother    Diabetes Brother    Drug abuse Brother    Colon cancer Neg Hx     Social History:   reports that he has been smoking cigarettes. He has a 21.5 pack-year smoking history. He has never been exposed to tobacco smoke. He has never used smokeless tobacco. He reports current alcohol use of about 12.0 standard drinks of alcohol per week. He reports that he does not currently use drugs after having used the following drugs: Cocaine.  ROS: 12 system Relevant ROS neg except per HPI  SpO2 97%. PHYSICAL EXAM: Gen: well appearing  Eyes: glasses, anicteric ENT: class 2 airway, poor dentition mostly missing  Neck: supple CV:  RRR Lungs: clear ant Extr: R Forearm loops AVG (AT) +t/b, high pitched in venous limb but at site of aneurysm; 2+ ipsilateral radial pulse Neuro: nonfocal    No results found. However, due to the size of the patient record, not all encounters were searched.  Please check Results Review for a complete set of results.  No results found.  Assessment/PlanWilliam R Sciulli is an 73 y.o. male with ESRD on HD, CHF, COPD, CAD s/p CABG 2004, gout, HIV who presents for evaluation of AVG.   **ESRD with HD access dysfunction:  prolonged bleeding, poor clearances.  Exam not too bad but possible outflow stenosis.  Pt consented for full angiographic evaluation and angioplasty if stenosis identified.  Informed of risks,benefits, alternatives and elects to proceed today without conscious sedation due to having eaten large breakfast.  He may elect to abort at any time if too much discomfort and return on a  different day with sedation - doubt he'll need to so we'll proceed today.    Tyler Pita 12/07/2023, 9:12 AM

## 2023-12-07 NOTE — Discharge Instructions (Addendum)
Fistulagram  General care instructions: - Do not drive or operate heavy machinery for 24hrs - Avoid making any important decisions for the remainder of the day. - You should be able to eat, drink, and resume your normal medications. - Avoid any strenuous activity for the remainder of the day. Potential complications: - Your hand is more cold or numb than usual. - You are bleeding at the site and it will not stop with direct pressure. If it was a declot expect some oozing at the site. Avoid extreme pressure to the site. - You have a change in the bruit and /or thrill in your fistula or graft. - You have a fever, swelling, see redness or feel heat at or near the puncture site. Medication instructions: - Continue routine medications unless otherwise instructed. 4. Please have your sutures removed at your next scheduled dialysis treatment.

## 2023-12-08 ENCOUNTER — Encounter (HOSPITAL_COMMUNITY): Payer: Self-pay | Admitting: Internal Medicine

## 2023-12-08 DIAGNOSIS — R197 Diarrhea, unspecified: Secondary | ICD-10-CM | POA: Diagnosis not present

## 2023-12-08 DIAGNOSIS — Z992 Dependence on renal dialysis: Secondary | ICD-10-CM | POA: Diagnosis not present

## 2023-12-08 DIAGNOSIS — D631 Anemia in chronic kidney disease: Secondary | ICD-10-CM | POA: Diagnosis not present

## 2023-12-08 DIAGNOSIS — D688 Other specified coagulation defects: Secondary | ICD-10-CM | POA: Diagnosis not present

## 2023-12-08 DIAGNOSIS — N2581 Secondary hyperparathyroidism of renal origin: Secondary | ICD-10-CM | POA: Diagnosis not present

## 2023-12-08 DIAGNOSIS — N186 End stage renal disease: Secondary | ICD-10-CM | POA: Diagnosis not present

## 2023-12-11 ENCOUNTER — Encounter (HOSPITAL_COMMUNITY): Payer: 59

## 2023-12-11 DIAGNOSIS — D688 Other specified coagulation defects: Secondary | ICD-10-CM | POA: Diagnosis not present

## 2023-12-11 DIAGNOSIS — N186 End stage renal disease: Secondary | ICD-10-CM | POA: Diagnosis not present

## 2023-12-11 DIAGNOSIS — R197 Diarrhea, unspecified: Secondary | ICD-10-CM | POA: Diagnosis not present

## 2023-12-11 DIAGNOSIS — D631 Anemia in chronic kidney disease: Secondary | ICD-10-CM | POA: Diagnosis not present

## 2023-12-11 DIAGNOSIS — Z992 Dependence on renal dialysis: Secondary | ICD-10-CM | POA: Diagnosis not present

## 2023-12-11 DIAGNOSIS — N2581 Secondary hyperparathyroidism of renal origin: Secondary | ICD-10-CM | POA: Diagnosis not present

## 2023-12-12 ENCOUNTER — Other Ambulatory Visit: Payer: Self-pay

## 2023-12-12 DIAGNOSIS — I5042 Chronic combined systolic (congestive) and diastolic (congestive) heart failure: Secondary | ICD-10-CM | POA: Diagnosis not present

## 2023-12-12 DIAGNOSIS — L97321 Non-pressure chronic ulcer of left ankle limited to breakdown of skin: Secondary | ICD-10-CM | POA: Diagnosis not present

## 2023-12-12 DIAGNOSIS — E1122 Type 2 diabetes mellitus with diabetic chronic kidney disease: Secondary | ICD-10-CM | POA: Diagnosis not present

## 2023-12-12 DIAGNOSIS — L97311 Non-pressure chronic ulcer of right ankle limited to breakdown of skin: Secondary | ICD-10-CM | POA: Diagnosis not present

## 2023-12-12 DIAGNOSIS — I87313 Chronic venous hypertension (idiopathic) with ulcer of bilateral lower extremity: Secondary | ICD-10-CM | POA: Diagnosis not present

## 2023-12-12 DIAGNOSIS — I132 Hypertensive heart and chronic kidney disease with heart failure and with stage 5 chronic kidney disease, or end stage renal disease: Secondary | ICD-10-CM | POA: Diagnosis not present

## 2023-12-12 NOTE — Progress Notes (Signed)
Specialty Pharmacy Refill Coordination Note  Jacob [name] is a 73 y.o. male contacted today regarding refills of specialty medication(s) Octreotide Acetate (SandoSTATIN LAR [place])   Patient requested No data recorded  Delivery date: 12/18/23   Verified address: [facility] [address] [place], [place], [address]   Medication will be filled on 12/15/23.

## 2023-12-12 NOTE — Progress Notes (Deleted)
 [name], male   DOB: Mar 09, 1951, 73 y.o.   MRN: [id number]  Reason for Consult: No chief complaint on file.   Referred by [name], *  Subjective:     HPI  Jacob [name] is a 73 y.o. male presenting for evaluation of PAD.  He has had left knee pain and swelling since June and has had cortisone injections.  He also reports he is an ulcer on his heel ***  Past Medical History:  Diagnosis Date   Acute respiratory failure (HCC) 03/01/2018   Anemia    Arthritis    "all over; mostly knees and back" (02/28/2018)   Chronic combined systolic and diastolic CHF (congestive heart failure) (HCC)    Chronic lower back pain    stenosis   Community acquired pneumonia 09/06/2013   COPD (chronic obstructive pulmonary disease) (HCC)    Coronary atherosclerosis of native coronary artery    a. 02/2003 s/p CABG x 2 (VG->RI, VG->RPDA; b. 11/2019 PCI: LM nl, LAD 90d, D3 50, RI 100, LCX 100p, OM3 100 - fills via L->L collats from D2/dLAD, RCA 100p, VG->RPDA ok, VG->RI 95 (3.5x48 Synergy XD DES).   Drug abuse (HCC)    hx; tested for cocaine as recently as 2/08. says he is not using drugs now - avoided defib. for this reason    ESRD (end stage renal disease) (HCC)    Hemo M-W-F- [facility]   Fall at home 10/2020   GERD (gastroesophageal reflux disease)    takes OTC meds as needed   GI bleeding    a. 11/2019 EGD: angiodysplastic lesions w/ bleeding s/p argon plasma/clipping/epi inj. Multiple admissions for the same.   Glaucoma    uses eye drops daily   Hepatitis B 1968   "tx'd w/isolation; caught it from toilet stools in gym"   History of blood transfusion 03/01/2019   History of colon polyps    benign   History of gout    takes Allopurinol daily as well as Colchicine-if needed (02/28/2018)   History of kidney stones    HTN (hypertension)    takes Coreg,Imdur.and Apresoline daily   Human immunodeficiency virus (HIV) disease (HCC) dx'd 1995   on Biktarvy as of 12/2020.      Hyperlipidemia    Ischemic cardiomyopathy    a. 01/2019 Echo: EF 40-45%, diffuse HK, mild basal septal hypertrophy. Diast dysfxn. Nl RV size/fxn. Sev dil LA. Triv MR/TR/PR.   Muscle spasm    takes Zanaflex as needed   Myocardial infarction (HCC) ~ 2004/2005   Nocturia    Peripheral neuropathy    takes gabapentin daily   Pneumonia    "at least twice" (02/28/2018)   SDH (subdural hematoma) (HCC)    Syphilis, unspecified    Type II diabetes mellitus (HCC) 2004   Lantus daily.Average fasting blood sugar 125-199   Wears glasses    Wears partial dentures    Family History  Problem Relation Age of Onset   Heart failure Father    Hypertension Father    Diabetes Brother    Heart attack Brother    Alzheimer's disease Mother    Stroke Sister    Diabetes Sister    Alzheimer's disease Sister    Hypertension Brother    Diabetes Brother    Drug abuse Brother    Colon cancer Neg Hx    Past Surgical History:  Procedure Laterality Date   A/V FISTULAGRAM N/A 12/07/2023   Procedure: A/V Fistulagram;  Surgeon:  Clearence Vitug Pita, MD;  Location: [facility] INVASIVE CV LAB;  Service: Cardiovascular;  Laterality: N/A;   AV FISTULA PLACEMENT Left 08/02/2018   Procedure: ARTERIOVENOUS (AV) FISTULA CREATION  left arm radiocephlic;  Surgeon: Cephus Shelling, MD;  Location: [facility] OR;  Service: Vascular;  Laterality: Left;   AV FISTULA PLACEMENT Left 08/01/2019   Procedure: LEFT BRACHIOCEPHALIC ARTERIOVENOUS (AV) FISTULA CREATION;  Surgeon: Larina Earthly, MD;  Location: [facility] OR;  Service: Vascular;  Laterality: Left;   AV FISTULA PLACEMENT Right 04/12/2022   Procedure: RIGHT UPPER EXTREMITY ARTERIOVENOUS (AV)  GRAFT INSERTION USING GORE STRETCH 4-7 MM;  Surgeon: Chuck Hint, MD;  Location: [facility] OR;  Service: Vascular;  Laterality: Right;   BASCILIC VEIN TRANSPOSITION Left 10/03/2019   Procedure: BASILIC VEIN TRANSPOSITION LEFT SECOND STAGE;  Surgeon: Larina Earthly, MD;  Location: [facility] OR;  Service: Vascular;   Laterality: Left;   BIOPSY  01/25/2021   Procedure: BIOPSY;  Surgeon: Sherrilyn Rist, MD;  Location: [facility] ENDOSCOPY;  Service: Gastroenterology;;   CARDIAC CATHETERIZATION  10/2002; 12/19/2004   Hattie Perch 03/08/2011   COLONOSCOPY  2013   Dr.John Marina Goodell    CORONARY ARTERY BYPASS GRAFT  02/24/2003   CABG X2/notes 03/08/2011   CORONARY STENT INTERVENTION N/A 12/19/2019   Procedure: CORONARY STENT INTERVENTION;  Surgeon: Corky Crafts, MD;  Location: [facility] INVASIVE CV LAB;  Service: Cardiovascular;  Laterality: N/A;   CORONARY ULTRASOUND/IVUS N/A 12/19/2019   Procedure: Intravascular Ultrasound/IVUS;  Surgeon: Corky Crafts, MD;  Location: [facility] INVASIVE CV LAB;  Service: Cardiovascular;  Laterality: N/A;   ENTEROSCOPY N/A 01/25/2021   Procedure: ENTEROSCOPY;  Surgeon: Sherrilyn Rist, MD;  Location: [facility] ENDOSCOPY;  Service: Gastroenterology;  Laterality: N/A;   ENTEROSCOPY N/A 02/13/2021   Procedure: ENTEROSCOPY;  Surgeon: Lynann Bologna, MD;  Location: [facility] ENDOSCOPY;  Service: Endoscopy;  Laterality: N/A;   ENTEROSCOPY N/A 05/07/2021   Procedure: ENTEROSCOPY;  Surgeon: Benancio Deeds, MD;  Location: [facility] ENDOSCOPY;  Service: Gastroenterology;  Laterality: N/A;   ESOPHAGOGASTRODUODENOSCOPY (EGD) WITH PROPOFOL N/A 02/08/2019   Procedure: ESOPHAGOGASTRODUODENOSCOPY (EGD) WITH PROPOFOL;  Surgeon: Rachael Fee, MD;  Location: [facility] ENDOSCOPY;  Service: Gastroenterology;  Laterality: N/A;   ESOPHAGOGASTRODUODENOSCOPY (EGD) WITH PROPOFOL N/A 12/22/2019   Procedure: ESOPHAGOGASTRODUODENOSCOPY (EGD) WITH PROPOFOL;  Surgeon: Shellia Cleverly, DO;  Location: [facility] ENDOSCOPY;  Service: Gastroenterology;  Laterality: N/A;   ESOPHAGOGASTRODUODENOSCOPY (EGD) WITH PROPOFOL N/A 10/19/2020   Procedure: ESOPHAGOGASTRODUODENOSCOPY (EGD) WITH PROPOFOL;  Surgeon: Lynann Bologna, MD;  Location: [facility] ENDOSCOPY;  Service: Endoscopy;  Laterality: N/A;   ESOPHAGOGASTRODUODENOSCOPY (EGD) WITH PROPOFOL N/A 12/22/2020    Procedure: ESOPHAGOGASTRODUODENOSCOPY (EGD) WITH PROPOFOL;  Surgeon: Iva Boop, MD;  Location: [facility] ENDOSCOPY;  Service: Endoscopy;  Laterality: N/A;   ESOPHAGOGASTRODUODENOSCOPY (EGD) WITH PROPOFOL N/A 01/09/2021   Procedure: ESOPHAGOGASTRODUODENOSCOPY (EGD) WITH PROPOFOL;  Surgeon: Hilarie Fredrickson, MD;  Location: [facility] ENDOSCOPY;  Service: Endoscopy;  Laterality: N/A;   HEMOSTASIS CLIP PLACEMENT  12/22/2019   Procedure: HEMOSTASIS CLIP PLACEMENT;  Surgeon: Shellia Cleverly, DO;  Location: [facility] ENDOSCOPY;  Service: Gastroenterology;;   HEMOSTASIS CLIP PLACEMENT  12/22/2020   Procedure: HEMOSTASIS CLIP PLACEMENT;  Surgeon: Iva Boop, MD;  Location: [facility] ENDOSCOPY;  Service: Endoscopy;;   HEMOSTASIS CONTROL  12/22/2020   Procedure: HEMOSTASIS CONTROL/hemospray;  Surgeon: Iva Boop, MD;  Location: [facility] ENDOSCOPY;  Service: Endoscopy;;   HOT HEMOSTASIS N/A 02/08/2019   Procedure: HOT HEMOSTASIS (ARGON PLASMA COAGULATION/BICAP);  Surgeon: Rachael Fee, MD;  Location: [facility]  ENDOSCOPY;  Service: Gastroenterology;  Laterality: N/A;   HOT HEMOSTASIS N/A 12/22/2019   Procedure: HOT HEMOSTASIS (ARGON PLASMA COAGULATION/BICAP);  Surgeon: Shellia Cleverly, DO;  Location: [facility] ENDOSCOPY;  Service: Gastroenterology;  Laterality: N/A;   HOT HEMOSTASIS N/A 10/19/2020   Procedure: HOT HEMOSTASIS (ARGON PLASMA COAGULATION/BICAP);  Surgeon: Lynann Bologna, MD;  Location: [facility] ENDOSCOPY;  Service: Endoscopy;  Laterality: N/A;   HOT HEMOSTASIS N/A 12/22/2020   Procedure: HOT HEMOSTASIS (ARGON PLASMA COAGULATION/BICAP);  Surgeon: Iva Boop, MD;  Location: [facility] ENDOSCOPY;  Service: Endoscopy;  Laterality: N/A;   HOT HEMOSTASIS N/A 01/09/2021   Procedure: HOT HEMOSTASIS (ARGON PLASMA COAGULATION/BICAP);  Surgeon: Hilarie Fredrickson, MD;  Location: [facility] ENDOSCOPY;  Service: Endoscopy;  Laterality: N/A;   HOT HEMOSTASIS N/A 01/25/2021   Procedure: HOT HEMOSTASIS (ARGON PLASMA COAGULATION/BICAP);  Surgeon: Sherrilyn Rist, MD;   Location: [facility] ENDOSCOPY;  Service: Gastroenterology;  Laterality: N/A;   HOT HEMOSTASIS N/A 02/13/2021   Procedure: HOT HEMOSTASIS (ARGON PLASMA COAGULATION/BICAP);  Surgeon: Lynann Bologna, MD;  Location: [facility] ENDOSCOPY;  Service: Endoscopy;  Laterality: N/A;   HOT HEMOSTASIS N/A 05/07/2021   Procedure: HOT HEMOSTASIS (ARGON PLASMA COAGULATION/BICAP);  Surgeon: Benancio Deeds, MD;  Location: [facility] ENDOSCOPY;  Service: Gastroenterology;  Laterality: N/A;   INSERTION OF DIALYSIS CATHETER N/A 02/08/2022   Procedure: INSERTION OF TUNNELED DIALYSIS CATHETER;  Surgeon: Chuck Hint, MD;  Location: [facility] OR;  Service: Vascular;  Laterality: N/A;   INTERTROCHANTERIC HIP FRACTURE SURGERY Left 11/2006   Hattie Perch 03/08/2011   IR FLUORO GUIDE CV LINE LEFT  03/14/2022   IR FLUORO GUIDE CV LINE RIGHT  07/24/2019   IR FLUORO GUIDE CV LINE RIGHT  07/30/2019   IR US GUIDE VASC ACCESS RIGHT  07/24/2019   IR US GUIDE VASC ACCESS RIGHT  07/30/2019   LAPAROSCOPIC CHOLECYSTECTOMY  05/2006   LIGATION OF ARTERIOVENOUS  FISTULA Left 02/08/2022   Procedure: LIGATION OF LEFT ARTERIOVENOUS  FISTULA;  Surgeon: Chuck Hint, MD;  Location: [facility] OR;  Service: Vascular;  Laterality: Left;   LIGATION OF COMPETING BRANCHES OF ARTERIOVENOUS FISTULA Left 11/05/2018   Procedure: LIGATION OF COMPETING BRANCHES OF ARTERIOVENOUS FISTULA  LEFT  ARM;  Surgeon: Cephus Shelling, MD;  Location: [facility] OR;  Service: Vascular;  Laterality: Left;   LUMBAR LAMINECTOMY/DECOMPRESSION MICRODISCECTOMY N/A 02/29/2016   Procedure: Left L4-5 Lateral Recess Decompression, Removal Extradural Intraspinal Facet Cyst;  Surgeon: Eldred Manges, MD;  Location: [facility] OR;  Service: Orthopedics;  Laterality: N/A;   METATARSAL HEAD EXCISION Right 07/20/2022   Procedure: Irragation and debridement of right foot wound with extraction of all necrotic soft tissue and bone;  Surgeon: Pilar Plate, DPM;  Location: [facility] OR;  Service: Podiatry;  Laterality: Right;   Right 4th Met head and base of proximal phalanx resection   MULTIPLE TOOTH EXTRACTIONS     ORIF MANDIBULAR FRACTURE Left 08/13/2004   ORIF of left body fracture mandible with KLS Martin 2.3-mm six hole/notes 03/08/2011   RIGHT/LEFT HEART CATH AND CORONARY/GRAFT ANGIOGRAPHY N/A 12/19/2019   Procedure: RIGHT/LEFT HEART CATH AND CORONARY/GRAFT ANGIOGRAPHY;  Surgeon: Corky Crafts, MD;  Location: [facility] INVASIVE CV LAB;  Service: Cardiovascular;  Laterality: N/A;   SCLEROTHERAPY  12/22/2019   Procedure: SCLEROTHERAPY;  Surgeon: Shellia Cleverly, DO;  Location: [facility] ENDOSCOPY;  Service: Gastroenterology;;   Susa Day  02/13/2021   Procedure: Susa Day;  Surgeon: Lynann Bologna, MD;  Location: [facility] ENDOSCOPY;  Service: Endoscopy;;    Short Social History:  Social History  Tobacco Use   Smoking status: Every Day    Current packs/day: 0.50    Average packs/day: 0.5 packs/day for 43.0 years (21.5 ttl pk-yrs)    Types: Cigarettes    Passive exposure: Never   Smokeless tobacco: Never  Substance Use Topics   Alcohol use: Yes    Alcohol/week: 12.0 standard drinks of alcohol    Types: 12 Standard drinks or equivalent per week    Comment: occassional    Allergies  Allergen Reactions   Augmentin [Amoxicillin-Pot Clavulanate] Diarrhea and Other (See Comments)    Severe diarrhea   Morphine Sulfate Anaphylaxis    Can't mix with oxycodone    Mucinex Fast-Max Other (See Comments)    Intense sweating    Amphetamines Other (See Comments)    Unknown reaction    Current Outpatient Medications  Medication Sig Dispense Refill   acetaminophen (TYLENOL) 650 MG CR tablet Take 1,300 mg by mouth 3 (three) times daily.     albuterol (PROVENTIL) (2.5 MG/3ML) 0.083% nebulizer solution Take 3 mLs (2.5 mg total) by nebulization every 6 (six) hours as needed for wheezing or shortness of breath. 75 mL 2   allopurinol (ZYLOPRIM) 100 MG tablet TAKE 1 TABLET BY MOUTH DAILY 90 tablet 1   atorvastatin  (LIPITOR) 10 MG tablet TAKE 1 TABLET(10 MG) BY MOUTH DAILY. 90 tablet 3   B-D UF III MINI PEN NEEDLES 31G X 5 MM MISC USE 4 TIMES DAILY 100 each 11   BIKTARVY 50-200-25 MG TABS tablet TAKE 1 TABLET BY MOUTH 1 TIME A DAY 30 tablet 1   Calcium Acetate 667 MG TABS Take 667-2,001 mg by mouth See admin instructions. Take 2001 mg by mouth three times a day with meals and 667 mg with each snack     carvedilol (COREG) 3.125 MG tablet TAKE 1 TABLET(3.125 MG) BY MOUTH TWICE DAILY WITH A MEAL 60 tablet 11   diclofenac Sodium (VOLTAREN) 1 % GEL Apply 2 grams topically to the affected area three times daily as needed for pain. 600 g 1   folic acid (FOLVITE) 1 MG tablet TAKE 1 TABLET(1 MG) BY MOUTH DAILY 90 tablet 1   gabapentin (NEURONTIN) 300 MG capsule TAKE 1 CAPSULE BY MOUTH DAILY. 90 capsule 2   gentamicin ointment (GARAMYCIN) 0.1 % Apply 1 Application topically daily. 15 g 0   glucose blood (ONETOUCH ULTRA) test strip Check blood sugar three times daily E11.40 300 strip 3   HUMALOG KWIKPEN 100 UNIT/ML KwikPen INJECT 20 UNITS UNDER THE SKIN 3 TIMES DAILY AS NEEDED FOR HIGH BLOOD SUGAR ABOVE 150 45 mL 3   isosorbide mononitrate (IMDUR) 30 MG 24 hr tablet Take 1 tablet (30 mg total) by mouth daily. 90 tablet 2   Lancets (ONETOUCH DELICA PLUS LANCET33G) MISC USE 3 TIMES DAILY 300 each 3   latanoprost (XALATAN) 0.005 % ophthalmic solution Place 1 drop into both eyes at bedtime.     lidocaine-prilocaine (EMLA) cream Apply topically as directed. At dialysis     Menthol-Camphor (TIGER BALM ARTHRITIS RUB) 11-11 % CREA Apply 1 Application topically as needed (pain).     Methoxy PEG-Epoetin Beta (MIRCERA IJ) as directed. At Dialysis     midodrine (PROAMATINE) 10 MG tablet Take 10 mg by mouth 3 (three) times a week.     multivitamin (RENA-VIT) TABS tablet Take 1 tablet by mouth daily.     octreotide (SANDOSTATIN LAR DEPOT) 10 MG injection Inject 10 mg into the muscle every 28 days. 1 kit 5  oxyCODONE-acetaminophen  (PERCOCET) 5-325 MG tablet Take 1 tablet by mouth every 8 (eight) hours as needed for severe pain (pain score 7-10). 90 tablet 0   PREVIDENT 5000 PLUS 1.1 % CREA dental cream Place 1 Application onto teeth at bedtime.     timolol (TIMOPTIC) 0.5 % ophthalmic solution Place 1 drop into both eyes in the morning.     umeclidinium-vilanterol (ANORO ELLIPTA) 62.5-25 MCG/ACT AEPB Inhale 1 puff into the lungs daily. 60 each 11   No current facility-administered medications for this visit.    REVIEW OF SYSTEMS  Positive for ***  All other systems were reviewed and are negative     Objective:  Objective   There were no vitals filed for this visit. There is no height or weight on file to calculate BMI.  Physical Exam General: no acute distress Cardiac: hemodynamically stable Pulm: normal work of breathing Abdomen: non-tender, no pulsatile mass*** Neuro: alert, no focal deficit Extremities: no edema, cyanosis or wounds*** Vascular:   Right: ***  Left: ***   Data: ABI +---------+------------------+-----+----------+--------+  Right   Rt Pressure (mmHg)IndexWaveform  Comment   +---------+------------------+-----+----------+--------+  Brachial                                  Fistula   +---------+------------------+-----+----------+--------+  PERO    112               0.93 monophasic          +---------+------------------+-----+----------+--------+  DP      118               0.98 monophasic          +---------+------------------+-----+----------+--------+  Great Toe74                0.62 Abnormal            +---------+------------------+-----+----------+--------+   +---------+------------------+-----+----------+-------+  Left    Lt Pressure (mmHg)IndexWaveform  Comment  +---------+------------------+-----+----------+-------+  Brachial 120                                       +---------+------------------+-----+----------+-------+  PERO     120               1.00 monophasic         +---------+------------------+-----+----------+-------+  DP      120               1.00 monophasic         +---------+------------------+-----+----------+-------+  Great Toe70                0.58 Abnormal           +---------+------------------+-----+----------+-------+   HIV labs reviewed, CD4 count 471, RNA undetectable  Echo reviewed, EF 40-45%, severe akinesis of the left ventricle  Assessment/Plan:     Jacob [name] is a 73 y.o. male ***    Recommendations to optimize cardiovascular risk: Abstinence from all tobacco products. Blood glucose control with goal A1c < 7%. Blood pressure control with goal blood pressure < 140/90 mmHg. Lipid reduction therapy with goal LDL-C <100 mg/dL  Aspirin 81mg  PO QD.  Atorvastatin 40-80mg  PO QD (or other "high intensity" statin therapy).     Daria Pastures MD [facility]

## 2023-12-13 DIAGNOSIS — N186 End stage renal disease: Secondary | ICD-10-CM | POA: Diagnosis not present

## 2023-12-13 DIAGNOSIS — D631 Anemia in chronic kidney disease: Secondary | ICD-10-CM | POA: Diagnosis not present

## 2023-12-13 DIAGNOSIS — R197 Diarrhea, unspecified: Secondary | ICD-10-CM | POA: Diagnosis not present

## 2023-12-13 DIAGNOSIS — D688 Other specified coagulation defects: Secondary | ICD-10-CM | POA: Diagnosis not present

## 2023-12-13 DIAGNOSIS — Z992 Dependence on renal dialysis: Secondary | ICD-10-CM | POA: Diagnosis not present

## 2023-12-13 DIAGNOSIS — N2581 Secondary hyperparathyroidism of renal origin: Secondary | ICD-10-CM | POA: Diagnosis not present

## 2023-12-14 ENCOUNTER — Emergency Department (HOSPITAL_BASED_OUTPATIENT_CLINIC_OR_DEPARTMENT_OTHER): Payer: 59

## 2023-12-14 ENCOUNTER — Emergency Department (HOSPITAL_COMMUNITY)
Admission: EM | Admit: 2023-12-14 | Discharge: 2023-12-14 | Disposition: A | Discharge status: Home / Self Care | Payer: 59 | Attending: Emergency Medicine | Admitting: Emergency Medicine

## 2023-12-14 ENCOUNTER — Other Ambulatory Visit: Payer: Self-pay

## 2023-12-14 ENCOUNTER — Encounter (HOSPITAL_COMMUNITY): Payer: 59

## 2023-12-14 DIAGNOSIS — I5042 Chronic combined systolic (congestive) and diastolic (congestive) heart failure: Secondary | ICD-10-CM | POA: Insufficient documentation

## 2023-12-14 DIAGNOSIS — Z955 Presence of coronary angioplasty implant and graft: Secondary | ICD-10-CM | POA: Diagnosis not present

## 2023-12-14 DIAGNOSIS — I132 Hypertensive heart and chronic kidney disease with heart failure and with stage 5 chronic kidney disease, or end stage renal disease: Secondary | ICD-10-CM | POA: Diagnosis not present

## 2023-12-14 DIAGNOSIS — Z7951 Long term (current) use of inhaled steroids: Secondary | ICD-10-CM | POA: Insufficient documentation

## 2023-12-14 DIAGNOSIS — D688 Other specified coagulation defects: Secondary | ICD-10-CM | POA: Diagnosis not present

## 2023-12-14 DIAGNOSIS — J449 Chronic obstructive pulmonary disease, unspecified: Secondary | ICD-10-CM | POA: Diagnosis not present

## 2023-12-14 DIAGNOSIS — Z794 Long term (current) use of insulin: Secondary | ICD-10-CM | POA: Diagnosis not present

## 2023-12-14 DIAGNOSIS — E1140 Type 2 diabetes mellitus with diabetic neuropathy, unspecified: Secondary | ICD-10-CM | POA: Insufficient documentation

## 2023-12-14 DIAGNOSIS — N2581 Secondary hyperparathyroidism of renal origin: Secondary | ICD-10-CM | POA: Diagnosis not present

## 2023-12-14 DIAGNOSIS — Z79899 Other long term (current) drug therapy: Secondary | ICD-10-CM | POA: Insufficient documentation

## 2023-12-14 DIAGNOSIS — Z992 Dependence on renal dialysis: Secondary | ICD-10-CM | POA: Diagnosis not present

## 2023-12-14 DIAGNOSIS — Z8616 Personal history of COVID-19: Secondary | ICD-10-CM | POA: Diagnosis not present

## 2023-12-14 DIAGNOSIS — I890 Lymphedema, not elsewhere classified: Secondary | ICD-10-CM | POA: Insufficient documentation

## 2023-12-14 DIAGNOSIS — E1142 Type 2 diabetes mellitus with diabetic polyneuropathy: Secondary | ICD-10-CM | POA: Diagnosis not present

## 2023-12-14 DIAGNOSIS — F17210 Nicotine dependence, cigarettes, uncomplicated: Secondary | ICD-10-CM | POA: Insufficient documentation

## 2023-12-14 DIAGNOSIS — I2510 Atherosclerotic heart disease of native coronary artery without angina pectoris: Secondary | ICD-10-CM | POA: Diagnosis not present

## 2023-12-14 DIAGNOSIS — Z21 Asymptomatic human immunodeficiency virus [HIV] infection status: Secondary | ICD-10-CM | POA: Insufficient documentation

## 2023-12-14 DIAGNOSIS — R609 Edema, unspecified: Secondary | ICD-10-CM | POA: Diagnosis not present

## 2023-12-14 DIAGNOSIS — M7989 Other specified soft tissue disorders: Secondary | ICD-10-CM | POA: Diagnosis not present

## 2023-12-14 DIAGNOSIS — N186 End stage renal disease: Secondary | ICD-10-CM | POA: Diagnosis not present

## 2023-12-14 DIAGNOSIS — E1122 Type 2 diabetes mellitus with diabetic chronic kidney disease: Secondary | ICD-10-CM | POA: Diagnosis not present

## 2023-12-14 DIAGNOSIS — R197 Diarrhea, unspecified: Secondary | ICD-10-CM | POA: Diagnosis not present

## 2023-12-14 DIAGNOSIS — D631 Anemia in chronic kidney disease: Secondary | ICD-10-CM | POA: Diagnosis not present

## 2023-12-14 MED ORDER — ACETAMINOPHEN 500 MG PO TABS: 1000.0000 mg | ORAL_TABLET | Freq: Once | ORAL | Status: DC

## 2023-12-14 NOTE — Progress Notes (Signed)
Lower extremity venous duplex completed. Please see CV Procedures for preliminary results.  Shona Simpson, RVT 12/14/23 11:55 AM

## 2023-12-14 NOTE — ED Triage Notes (Signed)
Send from HD for leg pain only finished 3 of 3.5 he was to have removed

## 2023-12-14 NOTE — Discharge Instructions (Signed)
Compression socks about the swelling in your leg.  You can take Tylenol for this at home.  Please follow-up with your PCP.

## 2023-12-14 NOTE — ED Provider Notes (Signed)
[place] EMERGENCY DEPARTMENT AT [facility] Provider Note  CSN: [id number] Arrival date & time: 12/14/23 1023  Chief Complaint(s) Leg Pain  HPI DEREON CORKERY is a 73 y.o. male here today for left lower extremity swelling.  Patient has a history of end-stage renal disease, Monday Wednesday Friday went to dialysis today.  He says that he noticed this morning that he had some swelling in both of his legs, but currently only his left leg is swollen.  He denies any trauma to the area.   Past Medical History Past Medical History:  Diagnosis Date   Acute respiratory failure (HCC) 03/01/2018   Anemia    Arthritis    "all over; mostly knees and back" (02/28/2018)   Chronic combined systolic and diastolic CHF (congestive heart failure) (HCC)    Chronic lower back pain    stenosis   Community acquired pneumonia 09/06/2013   COPD (chronic obstructive pulmonary disease) (HCC)    Coronary atherosclerosis of native coronary artery    a. 02/2003 s/p CABG x 2 (VG->RI, VG->RPDA; b. 11/2019 PCI: LM nl, LAD 90d, D3 50, RI 100, LCX 100p, OM3 100 - fills via L->L collats from D2/dLAD, RCA 100p, VG->RPDA ok, VG->RI 95 (3.5x48 Synergy XD DES).   Drug abuse (HCC)    hx; tested for cocaine as recently as 2/08. says he is not using drugs now - avoided defib. for this reason    ESRD (end stage renal disease) (HCC)    [place] M-W-F- [facility]   Fall at home 10/2020   GERD (gastroesophageal reflux disease)    takes OTC meds as needed   GI bleeding    a. 11/2019 EGD: angiodysplastic lesions w/ bleeding s/p argon plasma/clipping/epi inj. Multiple admissions for the same.   Glaucoma    uses eye drops daily   Hepatitis B 1968   "tx'd w/isolation; caught it from toilet stools in gym"   History of blood transfusion 03/01/2019   History of colon polyps    benign   History of gout    takes Allopurinol daily as well as Colchicine-if needed (02/28/2018)   History of kidney stones    HTN (hypertension)     takes Coreg,Imdur.and Apresoline daily   Human immunodeficiency virus (HIV) disease (HCC) dx'd 1995   on Biktarvy as of 12/2020.     Hyperlipidemia    Ischemic cardiomyopathy    a. 01/2019 Echo: EF 40-45%, diffuse HK, mild basal septal hypertrophy. Diast dysfxn. Nl RV size/fxn. Sev dil LA. Triv MR/TR/PR.   Muscle spasm    takes Zanaflex as needed   Myocardial infarction (HCC) ~ 2004/2005   Nocturia    Peripheral neuropathy    takes gabapentin daily   Pneumonia    "at least twice" (02/28/2018)   SDH (subdural hematoma) (HCC)    Syphilis, unspecified    Type II diabetes mellitus (HCC) 2004   Lantus daily.Average fasting blood sugar 125-199   Wears glasses    Wears partial dentures    Patient Active Problem List   Diagnosis Date Noted   Fluid overload 06/14/2023   Routine screening for STI (sexually transmitted infection) 03/07/2023   Diabetic infection of right foot (HCC) 07/18/2022   Chronic pain syndrome 03/29/2022   Chronic lower back pain    Thrombocytopenia (HCC)    Hypercalcemia 04/13/2021   Helicobacter pylori (H. pylori) infection 04/06/2021   Lumbar foraminal stenosis 03/30/2021   Gastric hemorrhage due to angiodysplasia of stomach    GI bleed 11/24/2020  COVID-19 virus infection 11/24/2020   Other mechanical complication of cranial or spinal infusion catheter, sequela 11/11/2020   Subdural hematoma (HCC) 11/06/2020   Fall at home, initial encounter 11/05/2020   Nicotine dependence, cigarettes, uncomplicated 11/05/2020   Alcohol abuse 11/05/2020   Unspecified trochanteric fracture of left femur, initial encounter for closed fracture (HCC) 11/05/2020   Traumatic subdural hematoma, initial encounter (HCC) 11/05/2020   Fracture of metatarsal of right foot, closed 11/05/2020   Nondisplaced fracture of greater trochanter of left femur, initial encounter for closed fracture (HCC)    Uremia    Angiodysplasia of intestine with hemorrhage    Pruritus, unspecified 10/02/2020    Iron deficiency anemia    Acute blood loss anemia    Acute on chronic anemia    Abnormal nuclear stress test 12/19/2019   Type 2 diabetes mellitus with diabetic neuropathy, unspecified (HCC) 08/07/2019   Allergy, unspecified, initial encounter 08/02/2019   Complication of vascular dialysis catheter 08/02/2019   Drug abuse counseling and surveillance of drug abuser 08/02/2019   Other specified coagulation defects (HCC) 08/02/2019   Secondary hyperparathyroidism of renal origin (HCC) 08/02/2019   Unspecified atrial fibrillation (HCC) 08/02/2019   ESRD on hemodialysis (HCC)    Cocaine abuse (HCC)    CHF exacerbation (HCC) 07/21/2019   Diabetic foot ulcer (HCC) 02/11/2019   AVM (arteriovenous malformation)    Melena 02/07/2019   Symptomatic anemia 02/06/2019   Hyperlipidemia associated with type 2 diabetes mellitus (HCC) 05/30/2018   Right foot ulcer (HCC) 02/28/2018   Chronic obstructive pulmonary disease (HCC) 02/28/2018   Anemia of chronic disease 11/20/2016   CHF (congestive heart failure) (HCC) 11/10/2016   History of lumbar laminectomy for spinal cord decompression 02/29/2016   Type 2 diabetes mellitus with diabetic polyneuropathy, with long-term current use of insulin (HCC) 06/17/2015   HTN (hypertension) 04/26/2015   DM (diabetes mellitus) (HCC) 04/26/2015   Ischemic cardiomyopathy 05/12/2014   Hyperkalemia 09/06/2013   DM II, diet controlled 12/30/2011   Bunion of left foot 11/25/2011   Bunion, right foot 11/25/2011   Polysubstance abuse (HCC) 07/28/2011   Hip fracture, left (HCC) 04/04/2011   Closed fracture of neck of femur (HCC) 04/04/2011   Insomnia 02/18/2011   Coronary artery disease involving native heart without angina pectoris 04/20/2009   Chronic combined systolic (congestive) and diastolic (congestive) heart failure (HCC) 04/20/2009   Mixed hyperlipidemia 11/20/2006   Gout 11/20/2006   TOBACCO ABUSE 11/20/2006   Essential hypertension 11/20/2006   Human  immunodeficiency virus (HIV) disease (HCC) 09/02/2006   Home Medication(s) Prior to Admission medications   Medication Sig Start Date End Date Taking? Authorizing Provider  acetaminophen (TYLENOL) 650 MG CR tablet Take 1,300 mg by mouth 3 (three) times daily.    [provider]  albuterol (PROVENTIL) (2.5 MG/3ML) 0.083% nebulizer solution Take 3 mLs (2.5 mg total) by nebulization every 6 (six) hours as needed for wheezing or shortness of breath. 06/20/23 06/19/24  [name], Dinah C, NP  allopurinol (ZYLOPRIM) 100 MG tablet TAKE 1 TABLET BY MOUTH DAILY 11/01/23   [name], MD  atorvastatin (LIPITOR) 10 MG tablet TAKE 1 TABLET(10 MG) BY MOUTH DAILY. 11/23/23   Gaston Islam., NP  B-D UF III MINI PEN NEEDLES 31G X 5 MM MISC USE 4 TIMES DAILY 07/13/23   [name], MD  BIKTARVY 50-200-25 MG TABS tablet TAKE 1 TABLET BY MOUTH 1 TIME A DAY 11/13/23   Gardiner Barefoot, MD  Calcium Acetate 667 MG TABS Take [phone]  mg by mouth See admin instructions. Take 2001 mg by mouth three times a day with meals and 667 mg with each snack 01/20/21   [provider]  carvedilol (COREG) 3.125 MG tablet TAKE 1 TABLET(3.125 MG) BY MOUTH TWICE DAILY WITH A MEAL 11/28/23   Gaston Islam., NP  diclofenac Sodium (VOLTAREN) 1 % GEL Apply 2 grams topically to the affected area three times daily as needed for pain. 12/19/22   [name], MD  folic acid (FOLVITE) 1 MG tablet TAKE 1 TABLET(1 MG) BY MOUTH DAILY 11/27/23   [name], MD  gabapentin (NEURONTIN) 300 MG capsule TAKE 1 CAPSULE BY MOUTH DAILY. 05/11/22   [name], MD  gentamicin ointment (GARAMYCIN) 0.1 % Apply 1 Application topically daily. 01/06/23   Standiford, Jenelle Mages, DPM  glucose blood (ONETOUCH ULTRA) test strip Check blood sugar three times daily E11.40 05/18/23   [name], MD  HUMALOG KWIKPEN 100 UNIT/ML KwikPen INJECT 20 UNITS UNDER THE SKIN 3 TIMES DAILY AS NEEDED FOR HIGH BLOOD SUGAR  ABOVE 150 11/01/23   [name], MD  isosorbide mononitrate (IMDUR) 30 MG 24 hr tablet Take 1 tablet (30 mg total) by mouth daily. 06/05/23   [name], MD  Lancets [facility] DELICA PLUS [place]) MISC USE 3 TIMES DAILY 08/15/22   [name], MD  latanoprost (XALATAN) 0.005 % ophthalmic solution Place 1 drop into both eyes at bedtime.    [provider]  lidocaine-prilocaine (EMLA) cream Apply topically as directed. At dialysis 10/30/23   [provider]  Menthol-Camphor (TIGER BALM ARTHRITIS RUB) 11-11 % CREA Apply 1 Application topically as needed (pain).    [provider]  Methoxy PEG-Epoetin Beta (MIRCERA IJ) as directed. At Dialysis 11/08/23 11/06/24  [provider]  midodrine (PROAMATINE) 10 MG tablet Take 10 mg by mouth 3 (three) times a week. 03/21/23   [provider]  multivitamin (RENA-VIT) TABS tablet Take 1 tablet by mouth daily.    [provider]  octreotide (SANDOSTATIN LAR DEPOT) 10 MG injection Inject 10 mg into the muscle every 28 days. 08/22/23   Hilarie Fredrickson, MD  oxyCODONE-acetaminophen (PERCOCET) 5-325 MG tablet Take 1 tablet by mouth every 8 (eight) hours as needed for severe pain (pain score 7-10). 12/05/23   [name], MD  PREVIDENT 5000 PLUS 1.1 % CREA dental cream Place 1 Application onto teeth at bedtime. 05/25/23   [provider]  timolol (TIMOPTIC) 0.5 % ophthalmic solution Place 1 drop into both eyes in the morning. 04/04/19   [provider]  umeclidinium-vilanterol (ANORO ELLIPTA) 62.5-25 MCG/ACT AEPB Inhale 1 puff into the lungs daily. 06/07/23   [name], MD                                                                                                                                    Past Surgical History  Past Surgical History:  Procedure Laterality Date   A/V FISTULAGRAM N/A 12/07/2023   Procedure: A/V Fistulagram;  Surgeon: Tyler Pita, MD;   Location[facility] INVASIVE CV LAB;  Service: Cardiovascular;  Laterality: N/A;   AV FISTULA PLACEMENT Left 08/02/2018   Procedure: ARTERIOVENOUS (AV) FISTULA CREATION  left arm radiocephlic;  Surgeon: Cephus Shelling, MD;  Location: [facility] OR;  Service: Vascular;  Laterality: Left;   AV FISTULA PLACEMENT Left 08/01/2019   Procedure: LEFT BRACHIOCEPHALIC ARTERIOVENOUS (AV) FISTULA CREATION;  Surgeon: Larina Earthly, MD;  Location: [facility] OR;  Service: Vascular;  Laterality: Left;   AV FISTULA PLACEMENT Right 04/12/2022   Procedure: RIGHT UPPER EXTREMITY ARTERIOVENOUS (AV)  GRAFT INSERTION USING GORE STRETCH 4-7 MM;  Surgeon: Chuck Hint, MD;  Location: [facility] OR;  Service: Vascular;  Laterality: Right;   BASCILIC VEIN TRANSPOSITION Left 10/03/2019   Procedure: BASILIC VEIN TRANSPOSITION LEFT SECOND STAGE;  Surgeon: Larina Earthly, MD;  Location: [facility] OR;  Service: Vascular;  Laterality: Left;   BIOPSY  01/25/2021   Procedure: BIOPSY;  Surgeon: Sherrilyn Rist, MD;  Location: [facility] ENDOSCOPY;  Service: Gastroenterology;;   CARDIAC CATHETERIZATION  10/2002; 12/19/2004   Hattie Perch 03/08/2011   COLONOSCOPY  2013   Dr.John Marina Goodell    CORONARY ARTERY BYPASS GRAFT  02/24/2003   CABG X2/notes 03/08/2011   CORONARY STENT INTERVENTION N/A 12/19/2019   Procedure: CORONARY STENT INTERVENTION;  Surgeon: Corky Crafts, MD;  Location: [facility] INVASIVE CV LAB;  Service: Cardiovascular;  Laterality: N/A;   CORONARY ULTRASOUND/IVUS N/A 12/19/2019   Procedure: Intravascular Ultrasound/IVUS;  Surgeon: Corky Crafts, MD;  Location: [facility] INVASIVE CV LAB;  Service: Cardiovascular;  Laterality: N/A;   ENTEROSCOPY N/A 01/25/2021   Procedure: ENTEROSCOPY;  Surgeon: Sherrilyn Rist, MD;  Location: [facility] ENDOSCOPY;  Service: Gastroenterology;  Laterality: N/A;   ENTEROSCOPY N/A 02/13/2021   Procedure: ENTEROSCOPY;  Surgeon: Lynann Bologna, MD;  Location: [facility] ENDOSCOPY;  Service: Endoscopy;  Laterality: N/A;   ENTEROSCOPY N/A 05/07/2021    Procedure: ENTEROSCOPY;  Surgeon: Benancio Deeds, MD;  Location: [facility] ENDOSCOPY;  Service: Gastroenterology;  Laterality: N/A;   ESOPHAGOGASTRODUODENOSCOPY (EGD) WITH PROPOFOL N/A 02/08/2019   Procedure: ESOPHAGOGASTRODUODENOSCOPY (EGD) WITH PROPOFOL;  Surgeon: Rachael Fee, MD;  Location: [facility] ENDOSCOPY;  Service: Gastroenterology;  Laterality: N/A;   ESOPHAGOGASTRODUODENOSCOPY (EGD) WITH PROPOFOL N/A 12/22/2019   Procedure: ESOPHAGOGASTRODUODENOSCOPY (EGD) WITH PROPOFOL;  Surgeon: Shellia Cleverly, DO;  Location: [facility] ENDOSCOPY;  Service: Gastroenterology;  Laterality: N/A;   ESOPHAGOGASTRODUODENOSCOPY (EGD) WITH PROPOFOL N/A 10/19/2020   Procedure: ESOPHAGOGASTRODUODENOSCOPY (EGD) WITH PROPOFOL;  Surgeon: Lynann Bologna, MD;  Location: [facility] ENDOSCOPY;  Service: Endoscopy;  Laterality: N/A;   ESOPHAGOGASTRODUODENOSCOPY (EGD) WITH PROPOFOL N/A 12/22/2020   Procedure: ESOPHAGOGASTRODUODENOSCOPY (EGD) WITH PROPOFOL;  Surgeon: Iva Boop, MD;  Location: [facility] ENDOSCOPY;  Service: Endoscopy;  Laterality: N/A;   ESOPHAGOGASTRODUODENOSCOPY (EGD) WITH PROPOFOL N/A 01/09/2021   Procedure: ESOPHAGOGASTRODUODENOSCOPY (EGD) WITH PROPOFOL;  Surgeon: Hilarie Fredrickson, MD;  Location: [facility] ENDOSCOPY;  Service: Endoscopy;  Laterality: N/A;   HEMOSTASIS CLIP PLACEMENT  12/22/2019   Procedure: HEMOSTASIS CLIP PLACEMENT;  Surgeon: Shellia Cleverly, DO;  Location: [facility] ENDOSCOPY;  Service: Gastroenterology;;   HEMOSTASIS CLIP PLACEMENT  12/22/2020   Procedure: HEMOSTASIS CLIP PLACEMENT;  Surgeon: Iva Boop, MD;  Location: [facility] ENDOSCOPY;  Service: Endoscopy;;   HEMOSTASIS CONTROL  12/22/2020   Procedure: HEMOSTASIS CONTROL/hemospray;  Surgeon: Iva Boop, MD;  Location: [facility] ENDOSCOPY;  Service: Endoscopy;;   HOT  HEMOSTASIS N/A 02/08/2019   Procedure: HOT HEMOSTASIS (ARGON PLASMA COAGULATION/BICAP);  Surgeon: Rachael Fee, MD;  Location: [facility] ENDOSCOPY;  Service: Gastroenterology;  Laterality: N/A;   HOT HEMOSTASIS  N/A 12/22/2019   Procedure: HOT HEMOSTASIS (ARGON PLASMA COAGULATION/BICAP);  Surgeon: Shellia Cleverly, DO;  Location: [facility] ENDOSCOPY;  Service: Gastroenterology;  Laterality: N/A;   HOT HEMOSTASIS N/A 10/19/2020   Procedure: HOT HEMOSTASIS (ARGON PLASMA COAGULATION/BICAP);  Surgeon: Lynann Bologna, MD;  Location: [facility] ENDOSCOPY;  Service: Endoscopy;  Laterality: N/A;   HOT HEMOSTASIS N/A 12/22/2020   Procedure: HOT HEMOSTASIS (ARGON PLASMA COAGULATION/BICAP);  Surgeon: Iva Boop, MD;  Location: [facility] ENDOSCOPY;  Service: Endoscopy;  Laterality: N/A;   HOT HEMOSTASIS N/A 01/09/2021   Procedure: HOT HEMOSTASIS (ARGON PLASMA COAGULATION/BICAP);  Surgeon: Hilarie Fredrickson, MD;  Location: [facility] ENDOSCOPY;  Service: Endoscopy;  Laterality: N/A;   HOT HEMOSTASIS N/A 01/25/2021   Procedure: HOT HEMOSTASIS (ARGON PLASMA COAGULATION/BICAP);  Surgeon: Sherrilyn Rist, MD;  Location: [facility] ENDOSCOPY;  Service: Gastroenterology;  Laterality: N/A;   HOT HEMOSTASIS N/A 02/13/2021   Procedure: HOT HEMOSTASIS (ARGON PLASMA COAGULATION/BICAP);  Surgeon: Lynann Bologna, MD;  Location: [facility] ENDOSCOPY;  Service: Endoscopy;  Laterality: N/A;   HOT HEMOSTASIS N/A 05/07/2021   Procedure: HOT HEMOSTASIS (ARGON PLASMA COAGULATION/BICAP);  Surgeon: Benancio Deeds, MD;  Location: [facility] ENDOSCOPY;  Service: Gastroenterology;  Laterality: N/A;   INSERTION OF DIALYSIS CATHETER N/A 02/08/2022   Procedure: INSERTION OF TUNNELED DIALYSIS CATHETER;  Surgeon: Chuck Hint, MD;  Location: [facility] OR;  Service: Vascular;  Laterality: N/A;   INTERTROCHANTERIC HIP FRACTURE SURGERY Left 11/2006   Hattie Perch 03/08/2011   IR FLUORO GUIDE CV LINE LEFT  03/14/2022   IR FLUORO GUIDE CV LINE RIGHT  07/24/2019   IR FLUORO GUIDE CV LINE RIGHT  07/30/2019   IR US GUIDE VASC ACCESS RIGHT  07/24/2019   IR US GUIDE VASC ACCESS RIGHT  07/30/2019   LAPAROSCOPIC CHOLECYSTECTOMY  05/2006   LIGATION OF ARTERIOVENOUS  FISTULA Left 02/08/2022   Procedure: LIGATION OF  LEFT ARTERIOVENOUS  FISTULA;  Surgeon: Chuck Hint, MD;  Location: [facility] OR;  Service: Vascular;  Laterality: Left;   LIGATION OF COMPETING BRANCHES OF ARTERIOVENOUS FISTULA Left 11/05/2018   Procedure: LIGATION OF COMPETING BRANCHES OF ARTERIOVENOUS FISTULA  LEFT  ARM;  Surgeon: Cephus Shelling, MD;  Location: [facility] OR;  Service: Vascular;  Laterality: Left;   LUMBAR LAMINECTOMY/DECOMPRESSION MICRODISCECTOMY N/A 02/29/2016   Procedure: Left L4-5 Lateral Recess Decompression, Removal Extradural Intraspinal Facet Cyst;  Surgeon: Eldred Manges, MD;  Location: [facility] OR;  Service: Orthopedics;  Laterality: N/A;   METATARSAL HEAD EXCISION Right 07/20/2022   Procedure: Irragation and debridement of right foot wound with extraction of all necrotic soft tissue and bone;  Surgeon: Pilar Plate, DPM;  Location: [facility] OR;  Service: Podiatry;  Laterality: Right;  Right 4th Met head and base of proximal phalanx resection   MULTIPLE TOOTH EXTRACTIONS     ORIF MANDIBULAR FRACTURE Left 08/13/2004   ORIF of left body fracture mandible with KLS [place] 2.3-mm six hole/notes 03/08/2011   RIGHT/LEFT HEART CATH AND CORONARY/GRAFT ANGIOGRAPHY N/A 12/19/2019   Procedure: RIGHT/LEFT HEART CATH AND CORONARY/GRAFT ANGIOGRAPHY;  Surgeon: Corky Crafts, MD;  Location: [facility] INVASIVE CV LAB;  Service: Cardiovascular;  Laterality: N/A;   SCLEROTHERAPY  12/22/2019   Procedure: SCLEROTHERAPY;  Surgeon: Shellia Cleverly, DO;  Location: [facility] ENDOSCOPY;  Service: Gastroenterology;;   Susa Day  02/13/2021   Procedure: SCLEROTHERAPY;  Surgeon: Lynann Bologna, MD;  Location: [facility] ENDOSCOPY;  Service: Endoscopy;;   Family History Family History  Problem Relation Age of Onset   Heart failure Father    Hypertension Father    Diabetes Brother    Heart attack Brother    Alzheimer's disease Mother    Stroke Sister    Diabetes Sister    Alzheimer's disease Sister    Hypertension Brother    Diabetes Brother    Drug abuse  Brother    Colon cancer Neg Hx     Social History Social History   Tobacco Use   Smoking status: Every Day    Current packs/day: 0.50    Average packs/day: 0.5 packs/day for 43.0 years (21.5 ttl pk-yrs)    Types: Cigarettes    Passive exposure: Never   Smokeless tobacco: Never  Vaping Use   Vaping status: Never Used  Substance Use Topics   Alcohol use: Yes    Alcohol/week: 12.0 standard drinks of alcohol    Types: 12 Standard drinks or equivalent per week    Comment: occassional   Drug use: Not Currently    Types: Cocaine    Comment: hx of crack/cocaine 40yrs ago 10/01/2019- none   Allergies Augmentin [amoxicillin-pot clavulanate], Morphine sulfate, Mucinex fast-max, and Amphetamines  Review of Systems Review of Systems  Physical Exam Vital Signs  I have reviewed the triage vital signs BP 112/66   Pulse 79   Temp (!) 97.4 F (36.3 C) (Oral)   Resp 20   Ht 6\' 1"  (1.854 m)   Wt 90 kg   SpO2 98%   BMI 26.18 kg/m   Physical Exam Vitals reviewed.  Cardiovascular:     Pulses: Normal pulses.     Comments: Normal PT and DP pulses in the left lower extremity Abdominal:     General: Abdomen is flat.  Musculoskeletal:        General: Normal range of motion.     Cervical back: Normal range of motion.  Skin:    General: Skin is warm and dry.     Comments: Normal color  Neurological:     Mental Status: He is alert.     Gait: Gait normal.     ED Results and Treatments Labs (all labs ordered are listed, but only abnormal results are displayed) Labs Reviewed - No data to display                                                                                                                        Radiology VAS [place] LOWER EXTREMITY VENOUS (DVT) (7a-7p) Result Date: 12/14/2023  Lower Venous DVT Study Patient Name:  ERAN MISTRY  Date of Exam:   12/14/2023 Medical Rec #: [id number]        Accession #:    [id number] Date of Birth: [birth year]         Patient Gender: M  Patient Age:   45 years Exam Location:  [facility] Procedure:  VAS [place] LOWER EXTREMITY VENOUS (DVT) Referring Phys: Zaharah Amir --------------------------------------------------------------------------------  Indications: Pain, Swelling, and Edema.  Risk Factors: None identified. Limitations: Poor ultrasound/tissue interface. Comparison Study: No significant changes seen since previous exam 06/09/23 Performing Technologist: Shona Simpson  Examination Guidelines: A complete evaluation includes B-mode imaging, spectral Doppler, color Doppler, and power Doppler as needed of all accessible portions of each vessel. Bilateral testing is considered an integral part of a complete examination. Limited examinations for reoccurring indications may be performed as noted. The reflux portion of the exam is performed with the patient in reverse Trendelenburg.  +---------+---------------+---------+-----------+----------+-------------------+ RIGHT    CompressibilityPhasicitySpontaneityPropertiesThrombus Aging      +---------+---------------+---------+-----------+----------+-------------------+ CFV      Full           Yes      Yes                                      +---------+---------------+---------+-----------+----------+-------------------+ SFJ      Full                                                             +---------+---------------+---------+-----------+----------+-------------------+ FV Prox  Full                                                             +---------+---------------+---------+-----------+----------+-------------------+ FV Mid   Full                                                             +---------+---------------+---------+-----------+----------+-------------------+ FV DistalFull                                                             +---------+---------------+---------+-----------+----------+-------------------+ PFV      Full                     Yes                  Not well visualized +---------+---------------+---------+-----------+----------+-------------------+ POP      Full           Yes      Yes                                      +---------+---------------+---------+-----------+----------+-------------------+ PTV      Full                                                             +---------+---------------+---------+-----------+----------+-------------------+  PERO     Full                                         Not well visualized +---------+---------------+---------+-----------+----------+-------------------+   +---------+---------------+---------+-----------+----------+---------------+ LEFT     CompressibilityPhasicitySpontaneityPropertiesThrombus Aging  +---------+---------------+---------+-----------+----------+---------------+ CFV      Full           Yes      Yes                                  +---------+---------------+---------+-----------+----------+---------------+ SFJ      Full                                                         +---------+---------------+---------+-----------+----------+---------------+ FV Prox  Full                                                         +---------+---------------+---------+-----------+----------+---------------+ FV Mid   Full                                                         +---------+---------------+---------+-----------+----------+---------------+ FV DistalFull                                                         +---------+---------------+---------+-----------+----------+---------------+ PFV      Full                                                         +---------+---------------+---------+-----------+----------+---------------+ POP      Full           Yes      Yes                                  +---------+---------------+---------+-----------+----------+---------------+ PTV       Full                                                         +---------+---------------+---------+-----------+----------+---------------+ PERO                             Yes  Patent by color +---------+---------------+---------+-----------+----------+---------------+     Summary: BILATERAL: - No evidence of deep vein thrombosis seen in the lower extremities, bilaterally. -No evidence of popliteal cyst, bilaterally.   *See table(s) above for measurements and observations.    Preliminary     Pertinent labs & imaging results that were available during my care of the patient were reviewed by me and considered in my medical decision making (see MDM for details).  Medications Ordered in ED Medications  acetaminophen (TYLENOL) tablet 1,000 mg (has no administration in time range)                                                                                                                                     Procedures Procedures  (including critical care time)  Medical Decision Making / ED Course   This patient presents to the ED for concern of leg swelling, this involves an extensive number of treatment options, and is a complaint that carries with it a high risk of complications and morbidity.  The differential diagnosis includes lymphedema, DVT, peripheral vascular disease, less likely arterial occlusion, less likely infectious.  MDM: Patient is left leg certainly more swollen than the right.  Will obtain ultrasound imaging.  Lower suspicion for infection based on exam.  Good pulses, low likelihood of arterial occlusion.  With the patient's history of ESRD, certainly at high risk for thrombus, as well as peripheral vascular disease.  Reassessment 12:30 PM-DVT ultrasounds negative bilaterally.  At this time, patient noted some swelling in his right lower extremity as well.  Believe that this is simply lymphedema.  Discussed this with the patient, he tells me he will  not wear compression socks.  Discharged with PCP follow-up.  Additional history obtained: -Additional history obtained from  -External records from outside source obtained and reviewed including: Chart review including previous notes, labs, imaging, consultation notes   Lab Tests: -I ordered, reviewed, and interpreted labs.   The pertinent results include:   Labs Reviewed - No data to display    EKG   EKG Interpretation Date/Time:    Ventricular Rate:    PR Interval:    QRS Duration:    QT Interval:    QTC Calculation:   R Axis:      Text Interpretation:           Imaging Studies ordered: I ordered imaging studies including DVT ultrasound I independently visualized and interpreted imaging. I agree with the radiologist interpretation   Medicines ordered and prescription drug management: Meds ordered this encounter  Medications   acetaminophen (TYLENOL) tablet 1,000 mg    -I have reviewed the patients home medicines and have made adjustments as needed   Social Determinants of Health:  Factors impacting patients care include: Multiple medical comorbidities including end-stage renal disease on dialysis.   Reevaluation: After the interventions noted above, I reevaluated the patient and found that they have :improved  Co morbidities that complicate the patient evaluation  Past Medical History:  Diagnosis Date   Acute respiratory failure (HCC) 03/01/2018   Anemia    Arthritis    "all over; mostly knees and back" (02/28/2018)   Chronic combined systolic and diastolic CHF (congestive heart failure) (HCC)    Chronic lower back pain    stenosis   Community acquired pneumonia 09/06/2013   COPD (chronic obstructive pulmonary disease) (HCC)    Coronary atherosclerosis of native coronary artery    a. 02/2003 s/p CABG x 2 (VG->RI, VG->RPDA; b. 11/2019 PCI: LM nl, LAD 90d, D3 50, RI 100, LCX 100p, OM3 100 - fills via L->L collats from D2/dLAD, RCA 100p, VG->RPDA ok, VG->RI  95 (3.5x48 Synergy XD DES).   Drug abuse (HCC)    hx; tested for cocaine as recently as 2/08. says he is not using drugs now - avoided defib. for this reason    ESRD (end stage renal disease) (HCC)    Hemo M-W-F- Valarie Merino   Fall at home 10/2020   GERD (gastroesophageal reflux disease)    takes OTC meds as needed   GI bleeding    a. 11/2019 EGD: angiodysplastic lesions w/ bleeding s/p argon plasma/clipping/epi inj. Multiple admissions for the same.   Glaucoma    uses eye drops daily   Hepatitis B 1968   "tx'd w/isolation; caught it from toilet stools in gym"   History of blood transfusion 03/01/2019   History of colon polyps    benign   History of gout    takes Allopurinol daily as well as Colchicine-if needed (02/28/2018)   History of kidney stones    HTN (hypertension)    takes Coreg,Imdur.and Apresoline daily   Human immunodeficiency virus (HIV) disease (HCC) dx'd 1995   on Biktarvy as of 12/2020.     Hyperlipidemia    Ischemic cardiomyopathy    a. 01/2019 Echo: EF 40-45%, diffuse HK, mild basal septal hypertrophy. Diast dysfxn. Nl RV size/fxn. Sev dil LA. Triv MR/TR/PR.   Muscle spasm    takes Zanaflex as needed   Myocardial infarction (HCC) ~ 2004/2005   Nocturia    Peripheral neuropathy    takes gabapentin daily   Pneumonia    "at least twice" (02/28/2018)   SDH (subdural hematoma) (HCC)    Syphilis, unspecified    Type II diabetes mellitus (HCC) 2004   Lantus daily.Average fasting blood sugar 125-199   Wears glasses    Wears partial dentures       Dispostion: Discharge     Final Clinical Impression(s) / ED Diagnoses Final diagnoses:  Lymphedema     @PCDICTATION @    Anders Simmonds T, DO 12/14/23 1239

## 2023-12-15 ENCOUNTER — Ambulatory Visit: Payer: 59 | Admitting: Vascular Surgery

## 2023-12-15 ENCOUNTER — Telehealth: Payer: Self-pay | Admitting: *Deleted

## 2023-12-15 NOTE — Telephone Encounter (Signed)
PT called regarding receiving wrong After Visit Summary (AVS) at discharge.  RNCM advised pt to discard the incorrect AVS and read his discharge instructions to him.  RNCM also encouraged pt to enroll in MyChart to go over his records and instructions.

## 2023-12-17 ENCOUNTER — Telehealth: Payer: Self-pay

## 2023-12-17 NOTE — Telephone Encounter (Signed)
 Patient called in about pain medicine and redness at ankles.He receives pain medication from primary/ specialty. Return precautions discussed, increased redness, fever. Paitent will go to see his regular MDs

## 2023-12-18 DIAGNOSIS — D631 Anemia in chronic kidney disease: Secondary | ICD-10-CM | POA: Diagnosis not present

## 2023-12-18 DIAGNOSIS — R197 Diarrhea, unspecified: Secondary | ICD-10-CM | POA: Diagnosis not present

## 2023-12-18 DIAGNOSIS — N186 End stage renal disease: Secondary | ICD-10-CM | POA: Diagnosis not present

## 2023-12-18 DIAGNOSIS — N2581 Secondary hyperparathyroidism of renal origin: Secondary | ICD-10-CM | POA: Diagnosis not present

## 2023-12-18 DIAGNOSIS — Z992 Dependence on renal dialysis: Secondary | ICD-10-CM | POA: Diagnosis not present

## 2023-12-18 DIAGNOSIS — D688 Other specified coagulation defects: Secondary | ICD-10-CM | POA: Diagnosis not present

## 2023-12-19 ENCOUNTER — Telehealth: Payer: Self-pay | Admitting: Emergency Medicine

## 2023-12-19 ENCOUNTER — Telehealth: Payer: Self-pay | Admitting: *Deleted

## 2023-12-19 DIAGNOSIS — L97321 Non-pressure chronic ulcer of left ankle limited to breakdown of skin: Secondary | ICD-10-CM | POA: Diagnosis not present

## 2023-12-19 DIAGNOSIS — L97329 Non-pressure chronic ulcer of left ankle with unspecified severity: Secondary | ICD-10-CM | POA: Diagnosis not present

## 2023-12-19 DIAGNOSIS — L97311 Non-pressure chronic ulcer of right ankle limited to breakdown of skin: Secondary | ICD-10-CM | POA: Diagnosis not present

## 2023-12-19 DIAGNOSIS — I87313 Chronic venous hypertension (idiopathic) with ulcer of bilateral lower extremity: Secondary | ICD-10-CM | POA: Diagnosis not present

## 2023-12-19 DIAGNOSIS — I5042 Chronic combined systolic (congestive) and diastolic (congestive) heart failure: Secondary | ICD-10-CM | POA: Diagnosis not present

## 2023-12-19 DIAGNOSIS — E1122 Type 2 diabetes mellitus with diabetic chronic kidney disease: Secondary | ICD-10-CM | POA: Diagnosis not present

## 2023-12-19 DIAGNOSIS — I132 Hypertensive heart and chronic kidney disease with heart failure and with stage 5 chronic kidney disease, or end stage renal disease: Secondary | ICD-10-CM | POA: Diagnosis not present

## 2023-12-19 NOTE — Telephone Encounter (Signed)
 Patient called stating he was in th ED recently and plans to follow-up with vascular on Friday. Patient wanted to be sure Dr.Veludandi was notified that he was in the ED and I assured him that she is aware, as the ED notes were cc'd to her.

## 2023-12-19 NOTE — Telephone Encounter (Signed)
 Patient would only like to speak with Jacob [name].

## 2023-12-19 NOTE — Telephone Encounter (Signed)
 Pt called regarding leg being red and swollen and wondering why he was not prescribed antibiotic.  RNCM reviewed chart and found that "DVT ultrasounds negative bilaterally. At this time, patient noted some swelling in his right lower extremity as well. Believe that this is simply lymphedema. Discussed this with the patient, he tells me he will not wear compression socks."  RNCM advised pt to return to ED to be re-evaluated.

## 2023-12-20 ENCOUNTER — Encounter (HOSPITAL_BASED_OUTPATIENT_CLINIC_OR_DEPARTMENT_OTHER): Payer: 59 | Admitting: General Surgery

## 2023-12-20 DIAGNOSIS — D688 Other specified coagulation defects: Secondary | ICD-10-CM | POA: Diagnosis not present

## 2023-12-20 DIAGNOSIS — R197 Diarrhea, unspecified: Secondary | ICD-10-CM | POA: Diagnosis not present

## 2023-12-20 DIAGNOSIS — N186 End stage renal disease: Secondary | ICD-10-CM | POA: Diagnosis not present

## 2023-12-20 DIAGNOSIS — Z992 Dependence on renal dialysis: Secondary | ICD-10-CM | POA: Diagnosis not present

## 2023-12-20 DIAGNOSIS — D631 Anemia in chronic kidney disease: Secondary | ICD-10-CM | POA: Diagnosis not present

## 2023-12-20 DIAGNOSIS — N2581 Secondary hyperparathyroidism of renal origin: Secondary | ICD-10-CM | POA: Diagnosis not present

## 2023-12-21 ENCOUNTER — Ambulatory Visit (HOSPITAL_COMMUNITY)
Admission: RE | Admit: 2023-12-21 | Discharge: 2023-12-21 | Disposition: A | Discharge status: Home / Self Care | Payer: 59 | Source: Ambulatory Visit | Attending: Gastroenterology | Admitting: Gastroenterology

## 2023-12-21 DIAGNOSIS — Q2730 Arteriovenous malformation, site unspecified: Secondary | ICD-10-CM | POA: Diagnosis not present

## 2023-12-21 DIAGNOSIS — D649 Anemia, unspecified: Secondary | ICD-10-CM | POA: Insufficient documentation

## 2023-12-21 MED ORDER — OCTREOTIDE ACETATE 10 MG IM KIT
10.0000 mg | PACK | INTRAMUSCULAR | Status: DC
Start: 2023-12-21 — End: 2023-12-22
  Administered 2023-12-21: 10 mg via INTRAMUSCULAR
  Filled 2023-12-21: qty 1

## 2023-12-21 NOTE — Progress Notes (Unsigned)
 [name], male   DOB: 1951/04/05, 73 y.o.   MRN: [id number]  Reason for Consult: No chief complaint on file.   Referred by [name], *  Subjective:     HPI  Jacob [name] is a 73 y.o. male presenting for evaluation of PAD.  He has had left knee pain and swelling since June and has had cortisone injections.  He also reports he is an ulcer on his heel ***  Past Medical History:  Diagnosis Date   Acute respiratory failure (HCC) 03/01/2018   Anemia    Arthritis    "all over; mostly knees and back" (02/28/2018)   Chronic combined systolic and diastolic CHF (congestive heart failure) (HCC)    Chronic lower back pain    stenosis   Community acquired pneumonia 09/06/2013   COPD (chronic obstructive pulmonary disease) (HCC)    Coronary atherosclerosis of native coronary artery    a. 02/2003 s/p CABG x 2 (VG->RI, VG->RPDA; b. 11/2019 PCI: LM nl, LAD 90d, D3 50, RI 100, LCX 100p, OM3 100 - fills via L->L collats from D2/dLAD, RCA 100p, VG->RPDA ok, VG->RI 95 (3.5x48 Synergy XD DES).   Drug abuse (HCC)    hx; tested for cocaine as recently as 2/08. says he is not using drugs now - avoided defib. for this reason    ESRD (end stage renal disease) (HCC)    Hemo M-W-F- Val[facility] Merino   Fall at home 10/2020   GERD (gastroesophageal reflux disease)    takes OTC meds as needed   GI bleeding    a. 11/2019 EGD: angiodysplastic lesions w/ bleeding s/p argon plasma/clipping/epi inj. Multiple admissions for the same.   Glaucoma    uses eye drops daily   Hepatitis B 1968   "tx'd w/isolation; caught it from toilet stools in gym"   History of blood transfusion 03/01/2019   History of colon polyps    benign   History of gout    takes Allopurinol daily as well as Colchicine-if needed (02/28/2018)   History of kidney stones    HTN (hypertension)    takes Coreg,Imdur.and Apresoline daily   Human immunodeficiency virus (HIV) disease (HCC) dx'd 1995   on Biktarvy as of 12/2020.      Hyperlipidemia    Ischemic cardiomyopathy    a. 01/2019 Echo: EF 40-45%, diffuse HK, mild basal septal hypertrophy. Diast dysfxn. Nl RV size/fxn. Sev dil LA. Triv MR/TR/PR.   Muscle spasm    takes Zanaflex as needed   Myocardial infarction (HCC) ~ 2004/2005   Nocturia    Peripheral neuropathy    takes gabapentin daily   Pneumonia    "at least twice" (02/28/2018)   SDH (subdural hematoma) (HCC)    Syphilis, unspecified    Type II diabetes mellitus (HCC) 2004   Lantus daily.Average fasting blood sugar 125-199   Wears glasses    Wears partial dentures    Family History  Problem Relation Age of Onset   Heart failure Father    Hypertension Father    Diabetes Brother    Heart attack Brother    Alzheimer's disease Mother    Stroke Sister    Diabetes Sister    Alzheimer's disease Sister    Hypertension Brother    Diabetes Brother    Drug abuse Brother    Colon cancer Neg Hx    Past Surgical History:  Procedure Laterality Date   A/V FISTULAGRAM N/A 12/07/2023   Procedure: A/V Fistulagram;  Surgeon:  Vastie Douty Pita, MD;  Location: [facility] INVASIVE CV LAB;  Service: Cardiovascular;  Laterality: N/A;   AV FISTULA PLACEMENT Left 08/02/2018   Procedure: ARTERIOVENOUS (AV) FISTULA CREATION  left arm radiocephlic;  Surgeon: Cephus Shelling, MD;  Location: [name] OR;  Service: Vascular;  Laterality: Left;   AV FISTULA PLACEMENT Left 08/01/2019   Procedure: LEFT BRACHIOCEPHALIC ARTERIOVENOUS (AV) FISTULA CREATION;  Surgeon: Larina Earthly, MD;  Location: [facility] OR;  Service: Vascular;  Laterality: Left;   AV FISTULA PLACEMENT Right 04/12/2022   Procedure: RIGHT UPPER EXTREMITY ARTERIOVENOUS (AV)  GRAFT INSERTION USING GORE STRETCH 4-7 MM;  Surgeon: Chuck Hint, MD;  Location: [name] OR;  Service: Vascular;  Laterality: Right;   BASCILIC VEIN TRANSPOSITION Left 10/03/2019   Procedure: BASILIC VEIN TRANSPOSITION LEFT SECOND STAGE;  Surgeon: Larina Earthly, MD;  Location: [facility] OR;  Service: Vascular;   Laterality: Left;   BIOPSY  01/25/2021   Procedure: BIOPSY;  Surgeon: Sherrilyn Rist, MD;  Location: [facility] ENDOSCOPY;  Service: Gastroenterology;;   CARDIAC CATHETERIZATION  10/2002; 12/19/2004   Hattie Perch 03/08/2011   COLONOSCOPY  2013   Dr.John Marina Goodell    CORONARY ARTERY BYPASS GRAFT  02/24/2003   CABG X2/notes 03/08/2011   CORONARY STENT INTERVENTION N/A 12/19/2019   Procedure: CORONARY STENT INTERVENTION;  Surgeon: Corky Crafts, MD;  Location: [facility] INVASIVE CV LAB;  Service: Cardiovascular;  Laterality: N/A;   CORONARY ULTRASOUND/IVUS N/A 12/19/2019   Procedure: Intravascular Ultrasound/IVUS;  Surgeon: Corky Crafts, MD;  Location: [facility] INVASIVE CV LAB;  Service: Cardiovascular;  Laterality: N/A;   ENTEROSCOPY N/A 01/25/2021   Procedure: ENTEROSCOPY;  Surgeon: Sherrilyn Rist, MD;  Location: [facility] ENDOSCOPY;  Service: Gastroenterology;  Laterality: N/A;   ENTEROSCOPY N/A 02/13/2021   Procedure: ENTEROSCOPY;  Surgeon: Lynann Bologna, MD;  Location: [facility] ENDOSCOPY;  Service: Endoscopy;  Laterality: N/A;   ENTEROSCOPY N/A 05/07/2021   Procedure: ENTEROSCOPY;  Surgeon: Benancio Deeds, MD;  Location: [facility] ENDOSCOPY;  Service: Gastroenterology;  Laterality: N/A;   ESOPHAGOGASTRODUODENOSCOPY (EGD) WITH PROPOFOL N/A 02/08/2019   Procedure: ESOPHAGOGASTRODUODENOSCOPY (EGD) WITH PROPOFOL;  Surgeon: Rachael Fee, MD;  Location: [facility] ENDOSCOPY;  Service: Gastroenterology;  Laterality: N/A;   ESOPHAGOGASTRODUODENOSCOPY (EGD) WITH PROPOFOL N/A 12/22/2019   Procedure: ESOPHAGOGASTRODUODENOSCOPY (EGD) WITH PROPOFOL;  Surgeon: Shellia Cleverly, DO;  Location: [facility] ENDOSCOPY;  Service: Gastroenterology;  Laterality: N/A;   ESOPHAGOGASTRODUODENOSCOPY (EGD) WITH PROPOFOL N/A 10/19/2020   Procedure: ESOPHAGOGASTRODUODENOSCOPY (EGD) WITH PROPOFOL;  Surgeon: Lynann Bologna, MD;  Location: [facility] ENDOSCOPY;  Service: Endoscopy;  Laterality: N/A;   ESOPHAGOGASTRODUODENOSCOPY (EGD) WITH PROPOFOL N/A 12/22/2020    Procedure: ESOPHAGOGASTRODUODENOSCOPY (EGD) WITH PROPOFOL;  Surgeon: Iva Boop, MD;  Location: [facility] ENDOSCOPY;  Service: Endoscopy;  Laterality: N/A;   ESOPHAGOGASTRODUODENOSCOPY (EGD) WITH PROPOFOL N/A 01/09/2021   Procedure: ESOPHAGOGASTRODUODENOSCOPY (EGD) WITH PROPOFOL;  Surgeon: Hilarie Fredrickson, MD;  Location: [facility] ENDOSCOPY;  Service: Endoscopy;  Laterality: N/A;   HEMOSTASIS CLIP PLACEMENT  12/22/2019   Procedure: HEMOSTASIS CLIP PLACEMENT;  Surgeon: Shellia Cleverly, DO;  Location: [facility] ENDOSCOPY;  Service: Gastroenterology;;   HEMOSTASIS CLIP PLACEMENT  12/22/2020   Procedure: HEMOSTASIS CLIP PLACEMENT;  Surgeon: Iva Boop, MD;  Location: [facility] ENDOSCOPY;  Service: Endoscopy;;   HEMOSTASIS CONTROL  12/22/2020   Procedure: HEMOSTASIS CONTROL/hemospray;  Surgeon: Iva Boop, MD;  Location: [facility] ENDOSCOPY;  Service: Endoscopy;;   HOT HEMOSTASIS N/A 02/08/2019   Procedure: HOT HEMOSTASIS (ARGON PLASMA COAGULATION/BICAP);  Surgeon: Rachael Fee, MD;  Location: [facility]  ENDOSCOPY;  Service: Gastroenterology;  Laterality: N/A;   HOT HEMOSTASIS N/A 12/22/2019   Procedure: HOT HEMOSTASIS (ARGON PLASMA COAGULATION/BICAP);  Surgeon: Shellia Cleverly, DO;  Location: [facility] ENDOSCOPY;  Service: Gastroenterology;  Laterality: N/A;   HOT HEMOSTASIS N/A 10/19/2020   Procedure: HOT HEMOSTASIS (ARGON PLASMA COAGULATION/BICAP);  Surgeon: Lynann Bologna, MD;  Location: [facility] ENDOSCOPY;  Service: Endoscopy;  Laterality: N/A;   HOT HEMOSTASIS N/A 12/22/2020   Procedure: HOT HEMOSTASIS (ARGON PLASMA COAGULATION/BICAP);  Surgeon: Iva Boop, MD;  Location: [facility] ENDOSCOPY;  Service: Endoscopy;  Laterality: N/A;   HOT HEMOSTASIS N/A 01/09/2021   Procedure: HOT HEMOSTASIS (ARGON PLASMA COAGULATION/BICAP);  Surgeon: Hilarie Fredrickson, MD;  Location: [facility] ENDOSCOPY;  Service: Endoscopy;  Laterality: N/A;   HOT HEMOSTASIS N/A 01/25/2021   Procedure: HOT HEMOSTASIS (ARGON PLASMA COAGULATION/BICAP);  Surgeon: Sherrilyn Rist, MD;   Location: [facility] ENDOSCOPY;  Service: Gastroenterology;  Laterality: N/A;   HOT HEMOSTASIS N/A 02/13/2021   Procedure: HOT HEMOSTASIS (ARGON PLASMA COAGULATION/BICAP);  Surgeon: Lynann Bologna, MD;  Location: [facility] ENDOSCOPY;  Service: Endoscopy;  Laterality: N/A;   HOT HEMOSTASIS N/A 05/07/2021   Procedure: HOT HEMOSTASIS (ARGON PLASMA COAGULATION/BICAP);  Surgeon: Benancio Deeds, MD;  Location: [facility] ENDOSCOPY;  Service: Gastroenterology;  Laterality: N/A;   INSERTION OF DIALYSIS CATHETER N/A 02/08/2022   Procedure: INSERTION OF TUNNELED DIALYSIS CATHETER;  Surgeon: Chuck Hint, MD;  Location: [facility] OR;  Service: Vascular;  Laterality: N/A;   INTERTROCHANTERIC HIP FRACTURE SURGERY Left 11/2006   Hattie Perch 03/08/2011   IR FLUORO GUIDE CV LINE LEFT  03/14/2022   IR FLUORO GUIDE CV LINE RIGHT  07/24/2019   IR FLUORO GUIDE CV LINE RIGHT  07/30/2019   IR US GUIDE VASC ACCESS RIGHT  07/24/2019   IR US GUIDE VASC ACCESS RIGHT  07/30/2019   LAPAROSCOPIC CHOLECYSTECTOMY  05/2006   LIGATION OF ARTERIOVENOUS  FISTULA Left 02/08/2022   Procedure: LIGATION OF LEFT ARTERIOVENOUS  FISTULA;  Surgeon: Chuck Hint, MD;  Location: [facility] OR;  Service: Vascular;  Laterality: Left;   LIGATION OF COMPETING BRANCHES OF ARTERIOVENOUS FISTULA Left 11/05/2018   Procedure: LIGATION OF COMPETING BRANCHES OF ARTERIOVENOUS FISTULA  LEFT  ARM;  Surgeon: Cephus Shelling, MD;  Location: [facility] OR;  Service: Vascular;  Laterality: Left;   LUMBAR LAMINECTOMY/DECOMPRESSION MICRODISCECTOMY N/A 02/29/2016   Procedure: Left L4-5 Lateral Recess Decompression, Removal Extradural Intraspinal Facet Cyst;  Surgeon: Eldred Manges, MD;  Location: [facility] OR;  Service: Orthopedics;  Laterality: N/A;   METATARSAL HEAD EXCISION Right 07/20/2022   Procedure: Irragation and debridement of right foot wound with extraction of all necrotic soft tissue and bone;  Surgeon: Pilar Plate, DPM;  Location: [facility] OR;  Service: Podiatry;  Laterality: Right;   Right 4th Met head and base of proximal phalanx resection   MULTIPLE TOOTH EXTRACTIONS     ORIF MANDIBULAR FRACTURE Left 08/13/2004   ORIF of left body fracture mandible with KLS Martin 2.3-mm six hole/notes 03/08/2011   RIGHT/LEFT HEART CATH AND CORONARY/GRAFT ANGIOGRAPHY N/A 12/19/2019   Procedure: RIGHT/LEFT HEART CATH AND CORONARY/GRAFT ANGIOGRAPHY;  Surgeon: Corky Crafts, MD;  Location: [facility] INVASIVE CV LAB;  Service: Cardiovascular;  Laterality: N/A;   SCLEROTHERAPY  12/22/2019   Procedure: SCLEROTHERAPY;  Surgeon: Shellia Cleverly, DO;  Location: [facility] ENDOSCOPY;  Service: Gastroenterology;;   Susa Day  02/13/2021   Procedure: Susa Day;  Surgeon: Lynann Bologna, MD;  Location: [facility] ENDOSCOPY;  Service: Endoscopy;;    Short Social History:  Social History  Tobacco Use   Smoking status: Every Day    Current packs/day: 0.50    Average packs/day: 0.5 packs/day for 43.0 years (21.5 ttl pk-yrs)    Types: Cigarettes    Passive exposure: Never   Smokeless tobacco: Never  Substance Use Topics   Alcohol use: Yes    Alcohol/week: 12.0 standard drinks of alcohol    Types: 12 Standard drinks or equivalent per week    Comment: occassional    Allergies  Allergen Reactions   Augmentin [Amoxicillin-Pot Clavulanate] Diarrhea and Other (See Comments)    Severe diarrhea   Morphine Sulfate Anaphylaxis    Can't mix with oxycodone    Mucinex Fast-Max Other (See Comments)    Intense sweating    Amphetamines Other (See Comments)    Unknown reaction    Current Outpatient Medications  Medication Sig Dispense Refill   acetaminophen (TYLENOL) 650 MG CR tablet Take 1,300 mg by mouth 3 (three) times daily.     albuterol (PROVENTIL) (2.5 MG/3ML) 0.083% nebulizer solution Take 3 mLs (2.5 mg total) by nebulization every 6 (six) hours as needed for wheezing or shortness of breath. 75 mL 2   allopurinol (ZYLOPRIM) 100 MG tablet TAKE 1 TABLET BY MOUTH DAILY 90 tablet 1   atorvastatin  (LIPITOR) 10 MG tablet TAKE 1 TABLET(10 MG) BY MOUTH DAILY. 90 tablet 3   B-D UF III MINI PEN NEEDLES 31G X 5 MM MISC USE 4 TIMES DAILY 100 each 11   BIKTARVY 50-200-25 MG TABS tablet TAKE 1 TABLET BY MOUTH 1 TIME A DAY 30 tablet 1   Calcium Acetate 667 MG TABS Take 667-2,001 mg by mouth See admin instructions. Take 2001 mg by mouth three times a day with meals and 667 mg with each snack     carvedilol (COREG) 3.125 MG tablet TAKE 1 TABLET(3.125 MG) BY MOUTH TWICE DAILY WITH A MEAL 60 tablet 11   diclofenac Sodium (VOLTAREN) 1 % GEL Apply 2 grams topically to the affected area three times daily as needed for pain. 600 g 1   folic acid (FOLVITE) 1 MG tablet TAKE 1 TABLET(1 MG) BY MOUTH DAILY 90 tablet 1   gabapentin (NEURONTIN) 300 MG capsule TAKE 1 CAPSULE BY MOUTH DAILY. 90 capsule 2   gentamicin ointment (GARAMYCIN) 0.1 % Apply 1 Application topically daily. 15 g 0   glucose blood (ONETOUCH ULTRA) test strip Check blood sugar three times daily E11.40 300 strip 3   HUMALOG KWIKPEN 100 UNIT/ML KwikPen INJECT 20 UNITS UNDER THE SKIN 3 TIMES DAILY AS NEEDED FOR HIGH BLOOD SUGAR ABOVE 150 45 mL 3   isosorbide mononitrate (IMDUR) 30 MG 24 hr tablet Take 1 tablet (30 mg total) by mouth daily. 90 tablet 2   Lancets (ONETOUCH DELICA PLUS LANCET33G) MISC USE 3 TIMES DAILY 300 each 3   latanoprost (XALATAN) 0.005 % ophthalmic solution Place 1 drop into both eyes at bedtime.     lidocaine-prilocaine (EMLA) cream Apply topically as directed. At dialysis     Menthol-Camphor (TIGER BALM ARTHRITIS RUB) 11-11 % CREA Apply 1 Application topically as needed (pain).     Methoxy PEG-Epoetin Beta (MIRCERA IJ) as directed. At Dialysis     midodrine (PROAMATINE) 10 MG tablet Take 10 mg by mouth 3 (three) times a week.     multivitamin (RENA-VIT) TABS tablet Take 1 tablet by mouth daily.     octreotide (SANDOSTATIN LAR DEPOT) 10 MG injection Inject 10 mg into the muscle every 28 days. 1 kit 5  oxyCODONE-acetaminophen  (PERCOCET) 5-325 MG tablet Take 1 tablet by mouth every 8 (eight) hours as needed for severe pain (pain score 7-10). 90 tablet 0   PREVIDENT 5000 PLUS 1.1 % CREA dental cream Place 1 Application onto teeth at bedtime.     timolol (TIMOPTIC) 0.5 % ophthalmic solution Place 1 drop into both eyes in the morning.     umeclidinium-vilanterol (ANORO ELLIPTA) 62.5-25 MCG/ACT AEPB Inhale 1 puff into the lungs daily. 60 each 11   No current facility-administered medications for this visit.    REVIEW OF SYSTEMS  Positive for ***  All other systems were reviewed and are negative     Objective:  Objective   There were no vitals filed for this visit. There is no height or weight on file to calculate BMI.  Physical Exam General: no acute distress Cardiac: hemodynamically stable Pulm: normal work of breathing Abdomen: non-tender, no pulsatile mass*** Neuro: alert, no focal deficit Extremities: no edema, cyanosis or wounds*** Vascular:   Right: ***  Left: ***   Data: ABI +---------+------------------+-----+----------+--------+  Right   Rt Pressure (mmHg)IndexWaveform  Comment   +---------+------------------+-----+----------+--------+  Brachial                                  Fistula   +---------+------------------+-----+----------+--------+  PERO    112               0.93 monophasic          +---------+------------------+-----+----------+--------+  DP      118               0.98 monophasic          +---------+------------------+-----+----------+--------+  Great Toe74                0.62 Abnormal            +---------+------------------+-----+----------+--------+   +---------+------------------+-----+----------+-------+  Left    Lt Pressure (mmHg)IndexWaveform  Comment  +---------+------------------+-----+----------+-------+  Brachial 120                                       +---------+------------------+-----+----------+-------+  PERO     120               1.00 monophasic         +---------+------------------+-----+----------+-------+  DP      120               1.00 monophasic         +---------+------------------+-----+----------+-------+  Great Toe70                0.58 Abnormal           +---------+------------------+-----+----------+-------+   HIV labs reviewed, CD4 count 471, RNA undetectable  Echo reviewed, EF 40-45%, severe akinesis of the left ventricle  Assessment/Plan:     Jacob [name] is a 72 y.o. male ***    Recommendations to optimize cardiovascular risk: Abstinence from all tobacco products. Blood glucose control with goal A1c < 7%. Blood pressure control with goal blood pressure < 140/90 mmHg. Lipid reduction therapy with goal LDL-C <100 mg/dL  Aspirin 81mg  PO QD.  Atorvastatin 40-80mg  PO QD (or other "high intensity" statin therapy).     Daria Pastures MD [facility]

## 2023-12-22 ENCOUNTER — Ambulatory Visit: Payer: 59 | Admitting: Vascular Surgery

## 2023-12-22 ENCOUNTER — Telehealth: Payer: Self-pay

## 2023-12-22 ENCOUNTER — Encounter: Payer: Self-pay | Admitting: Vascular Surgery

## 2023-12-22 VITALS — BP 102/64 | HR 72 | Temp 97.7°F | Resp 20 | Ht 73.0 in | Wt 197.0 lb

## 2023-12-22 DIAGNOSIS — I872 Venous insufficiency (chronic) (peripheral): Secondary | ICD-10-CM

## 2023-12-22 DIAGNOSIS — N2581 Secondary hyperparathyroidism of renal origin: Secondary | ICD-10-CM | POA: Diagnosis not present

## 2023-12-22 DIAGNOSIS — I739 Peripheral vascular disease, unspecified: Secondary | ICD-10-CM | POA: Diagnosis not present

## 2023-12-22 DIAGNOSIS — I129 Hypertensive chronic kidney disease with stage 1 through stage 4 chronic kidney disease, or unspecified chronic kidney disease: Secondary | ICD-10-CM | POA: Diagnosis not present

## 2023-12-22 DIAGNOSIS — D688 Other specified coagulation defects: Secondary | ICD-10-CM | POA: Diagnosis not present

## 2023-12-22 DIAGNOSIS — Z992 Dependence on renal dialysis: Secondary | ICD-10-CM | POA: Diagnosis not present

## 2023-12-22 DIAGNOSIS — D631 Anemia in chronic kidney disease: Secondary | ICD-10-CM | POA: Diagnosis not present

## 2023-12-22 DIAGNOSIS — R197 Diarrhea, unspecified: Secondary | ICD-10-CM | POA: Diagnosis not present

## 2023-12-22 DIAGNOSIS — N186 End stage renal disease: Secondary | ICD-10-CM | POA: Diagnosis not present

## 2023-12-22 NOTE — Telephone Encounter (Signed)
 Attempted to call patient back after he left a voicemail

## 2023-12-24 ENCOUNTER — Telehealth: Payer: Self-pay | Admitting: Family

## 2023-12-24 DIAGNOSIS — L03115 Cellulitis of right lower limb: Secondary | ICD-10-CM

## 2023-12-24 MED ORDER — DOXYCYCLINE HYCLATE 100 MG PO TABS: 100.0000 mg | ORAL_TABLET | Freq: Two times a day (BID) | ORAL | 0 refills | Status: AC

## 2023-12-24 NOTE — Telephone Encounter (Signed)
 Patient called on call provider states right leg red and draining.states has bilateral lower extremity edema being wrapped by Home Health Nurse.He denies any fever or chills.Has upcoming appointment with you on 12/26/2023.He request MD to call in antibiotics in the meantime until he sees you on Tuesday.Doxycycline 100 mg tablet one by mouth twice daily x 7 days.

## 2023-12-25 DIAGNOSIS — D631 Anemia in chronic kidney disease: Secondary | ICD-10-CM | POA: Diagnosis not present

## 2023-12-25 DIAGNOSIS — N186 End stage renal disease: Secondary | ICD-10-CM | POA: Diagnosis not present

## 2023-12-25 DIAGNOSIS — R197 Diarrhea, unspecified: Secondary | ICD-10-CM | POA: Diagnosis not present

## 2023-12-25 DIAGNOSIS — D688 Other specified coagulation defects: Secondary | ICD-10-CM | POA: Diagnosis not present

## 2023-12-25 DIAGNOSIS — N2581 Secondary hyperparathyroidism of renal origin: Secondary | ICD-10-CM | POA: Diagnosis not present

## 2023-12-25 DIAGNOSIS — Z992 Dependence on renal dialysis: Secondary | ICD-10-CM | POA: Diagnosis not present

## 2023-12-25 NOTE — Telephone Encounter (Signed)
 [name], MD  [facility]1 hour ago (9:09 AM)    Plz see the note from Dinah, pt needs to be seen in clinic, will prescribe antibiotics after seeing him in clinic if needed depending on how his wound is , thanks.     Called and spoke with patient. I offered patient an appointment to be seen today for evaluation and he Refused appointment. Patient stated that he will just wait till tomorrow to be seen. I offered appointment again for today and patient stated that he was tired and he will just wait till tomorrow to be evaluated.

## 2023-12-26 ENCOUNTER — Encounter: Payer: Self-pay | Admitting: Sports Medicine

## 2023-12-26 ENCOUNTER — Ambulatory Visit: Payer: 59 | Admitting: Sports Medicine

## 2023-12-26 VITALS — BP 100/60 | HR 91 | Temp 97.3°F | Resp 17 | Ht 73.0 in | Wt 201.8 lb

## 2023-12-26 DIAGNOSIS — E1122 Type 2 diabetes mellitus with diabetic chronic kidney disease: Secondary | ICD-10-CM | POA: Diagnosis not present

## 2023-12-26 DIAGNOSIS — I5042 Chronic combined systolic (congestive) and diastolic (congestive) heart failure: Secondary | ICD-10-CM | POA: Diagnosis not present

## 2023-12-26 DIAGNOSIS — I132 Hypertensive heart and chronic kidney disease with heart failure and with stage 5 chronic kidney disease, or end stage renal disease: Secondary | ICD-10-CM | POA: Diagnosis not present

## 2023-12-26 DIAGNOSIS — N186 End stage renal disease: Secondary | ICD-10-CM | POA: Diagnosis not present

## 2023-12-26 DIAGNOSIS — Z992 Dependence on renal dialysis: Secondary | ICD-10-CM

## 2023-12-26 DIAGNOSIS — T148XXA Other injury of unspecified body region, initial encounter: Secondary | ICD-10-CM

## 2023-12-26 DIAGNOSIS — E1142 Type 2 diabetes mellitus with diabetic polyneuropathy: Secondary | ICD-10-CM | POA: Diagnosis not present

## 2023-12-26 DIAGNOSIS — L97321 Non-pressure chronic ulcer of left ankle limited to breakdown of skin: Secondary | ICD-10-CM | POA: Diagnosis not present

## 2023-12-26 DIAGNOSIS — L97311 Non-pressure chronic ulcer of right ankle limited to breakdown of skin: Secondary | ICD-10-CM | POA: Diagnosis not present

## 2023-12-26 DIAGNOSIS — Z794 Long term (current) use of insulin: Secondary | ICD-10-CM | POA: Diagnosis not present

## 2023-12-26 DIAGNOSIS — I87313 Chronic venous hypertension (idiopathic) with ulcer of bilateral lower extremity: Secondary | ICD-10-CM | POA: Diagnosis not present

## 2023-12-26 DIAGNOSIS — G894 Chronic pain syndrome: Secondary | ICD-10-CM | POA: Diagnosis not present

## 2023-12-26 DIAGNOSIS — M545 Low back pain, unspecified: Secondary | ICD-10-CM | POA: Diagnosis not present

## 2023-12-26 MED ORDER — INSULIN DEGLUDEC 100 UNIT/ML ~~LOC~~ SOPN: 10.0000 [IU] | PEN_INJECTOR | Freq: Every day | SUBCUTANEOUS | 3 refills | Status: DC

## 2023-12-26 MED ORDER — HUMALOG KWIKPEN 100 UNIT/ML ~~LOC~~ SOPN: 5.0000 [IU] | PEN_INJECTOR | Freq: Three times a day (TID) | SUBCUTANEOUS | 3 refills | Status: DC

## 2023-12-26 MED ORDER — TRIPLE ANTIBIOTIC 3.5-400-5000 EX OINT: 1.0000 | TOPICAL_OINTMENT | Freq: Two times a day (BID) | CUTANEOUS | 0 refills | Status: DC

## 2023-12-26 NOTE — Progress Notes (Signed)
 Careteam: Patient Care Team: Jacob Sheffield, MD as PCP - General (Internal Medicine) Jacob Stade, MD as PCP - Cardiology (Cardiology) Jacob Berlin, MD as Consulting Physician (Ophthalmology) Jacob Stade, MD as Consulting Physician (Cardiology) Jacob Asters, MD (Inactive) as Consulting Physician (Infectious Diseases) Jacob [name], DPM as Consulting Physician (Podiatry) Jacob [name], Jacob Citrin, NP as Nurse Practitioner (Family Medicine) Pa, [facility], [facility], Karma Lew, RN as VBCI Care Management  PLACE OF SERVICE:  [facility]  Advanced Directive information Does Patient Have a Medical Advance Directive?: No, Does patient want to make changes to medical advance directive?: No - Patient declined  Allergies  Allergen Reactions   Augmentin [Amoxicillin-Pot Clavulanate] Diarrhea and Other (See Comments)    Severe diarrhea   Morphine Sulfate Anaphylaxis    Can't mix with oxycodone    Mucinex Fast-Max Other (See Comments)    Intense sweating    Amphetamines Other (See Comments)    Unknown reaction    Chief Complaint  Patient presents with   Medical Management of Chronic Issues    3 month follow up.    Health Maintenance    Discuss the need for Lipid panel, and Hemoglobin A1C.    Immunizations    Discuss the need for [employer].        73 yr old M with h/o ESRD, DM, HTN presented to clinic for follow up  He did not bring his medicines with him today  Chronic venous insufficiency  Saw vascular last week, ABI normal  States his swelling comes and goes He has small open wound on his Rt ankle, states home health nurse does wound dressing change once a week Denies fevers, chills  ESRD  M/W/F  Active smoker He smokes 1/2 pack daily States he does no do drugs  But informed the cna during rooming process when asked if he takes street drugs , reported 2 weeks ago and then when asked again said long time ago.  Lower  back pain  Intermittent  States its mostly in his lower back with pain radiating to side of his leg Denies  lower extremity weakness or fecal incontinence Takes oxycodone 3 tab/ day   DM  Lab Results  Component Value Date   HGBA1C 6.8 (H) 06/15/2023   HGBA1C 6.9 02/02/2023   HGBA1C 6.4 08/03/2022   Fasting 180- 250  During the day varies 87-    Review of Systems:  Review of Systems  Constitutional:  Negative for chills and fever.  HENT:  Negative for congestion and sore throat.   Eyes:  Negative for double vision.  Respiratory:  Positive for shortness of breath (exertional). Negative for cough and sputum production.   Cardiovascular:  Positive for leg swelling. Negative for chest pain and palpitations.  Gastrointestinal:  Negative for abdominal pain, heartburn and nausea.  Genitourinary:  Negative for dysuria.  Musculoskeletal:  Positive for back pain.  Neurological:  Negative for dizziness.   Negative unless indicated in HPI.   Past Medical History:  Diagnosis Date   Acute respiratory failure (HCC) 03/01/2018   Anemia    Arthritis    "all over; mostly knees and back" (02/28/2018)   Chronic combined systolic and diastolic CHF (congestive heart failure) (HCC)    Chronic lower back pain    stenosis   Community acquired pneumonia 09/06/2013   COPD (chronic obstructive pulmonary disease) (HCC)    Coronary atherosclerosis of native coronary artery    a. 02/2003 s/p CABG x  2 (VG->RI, VG->RPDA; b. 11/2019 PCI: LM nl, LAD 90d, D3 50, RI 100, LCX 100p, OM3 100 - fills via L->L collats from D2/dLAD, RCA 100p, VG->RPDA ok, VG->RI 95 (3.5x48 Synergy XD DES).   Drug abuse (HCC)    hx; tested for cocaine as recently as 2/08. says he is not using drugs now - avoided defib. for this reason    ESRD (end stage renal disease) (HCC)    Hemo M-W-F- [facility]   Fall at home 10/2020   GERD (gastroesophageal reflux disease)    takes OTC meds as needed   GI bleeding    a. 11/2019 EGD:  angiodysplastic lesions w/ bleeding s/p argon plasma/clipping/epi inj. Multiple admissions for the same.   Glaucoma    uses eye drops daily   Hepatitis B 1968   "tx'd w/isolation; caught it from toilet stools in gym"   History of blood transfusion 03/01/2019   History of colon polyps    benign   History of gout    takes Allopurinol daily as well as Colchicine-if needed (02/28/2018)   History of kidney stones    HTN (hypertension)    takes Coreg,Imdur.and Apresoline daily   Human immunodeficiency virus (HIV) disease (HCC) dx'd 1995   on Biktarvy as of 12/2020.     Hyperlipidemia    Ischemic cardiomyopathy    a. 01/2019 Echo: EF 40-45%, diffuse HK, mild basal septal hypertrophy. Diast dysfxn. Nl RV size/fxn. Sev dil LA. Triv MR/TR/PR.   Muscle spasm    takes Zanaflex as needed   Myocardial infarction (HCC) ~ 2004/2005   Nocturia    Peripheral neuropathy    takes gabapentin daily   Pneumonia    "at least twice" (02/28/2018)   SDH (subdural hematoma) (HCC)    Syphilis, unspecified    Type II diabetes mellitus (HCC) 2004   Lantus daily.Average fasting blood sugar 125-199   Wears glasses    Wears partial dentures    Past Surgical History:  Procedure Laterality Date   A/V FISTULAGRAM N/A 12/07/2023   Procedure: A/V Fistulagram;  Surgeon: Tyler Pita, MD;  [facility] INVASIVE CV LAB;  Service: Cardiovascular;  Laterality: N/A;   AV FISTULA PLACEMENT Left 08/02/2018   Procedure: ARTERIOVENOUS (AV) FISTULA CREATION  left arm radiocephlic;  Surgeon: Cephus Shelling, MD;  Location: [facility] OR;  Service: Vascular;  Laterality: Left;   AV FISTULA PLACEMENT Left 08/01/2019   Procedure: LEFT BRACHIOCEPHALIC ARTERIOVENOUS (AV) FISTULA CREATION;  Surgeon: Larina Earthly, MD;  Location: [facility] OR;  Service: Vascular;  Laterality: Left;   AV FISTULA PLACEMENT Right 04/12/2022   Procedure: RIGHT UPPER EXTREMITY ARTERIOVENOUS (AV)  GRAFT INSERTION USING GORE STRETCH 4-7 MM;  Surgeon: Chuck Hint, MD;  Location: [facility] OR;  Service: Vascular;  Laterality: Right;   BASCILIC VEIN TRANSPOSITION Left 10/03/2019   Procedure: BASILIC VEIN TRANSPOSITION LEFT SECOND STAGE;  Surgeon: Larina Earthly, MD;  Location: [facility] OR;  Service: Vascular;  Laterality: Left;   BIOPSY  01/25/2021   Procedure: BIOPSY;  Surgeon: Sherrilyn Rist, MD;  Location: [facility] ENDOSCOPY;  Service: Gastroenterology;;   CARDIAC CATHETERIZATION  10/2002; 12/19/2004   Hattie Perch 03/08/2011   COLONOSCOPY  2013   Dr.John Marina Goodell    CORONARY ARTERY BYPASS GRAFT  02/24/2003   CABG X2/notes 03/08/2011   CORONARY STENT INTERVENTION N/A 12/19/2019   Procedure: CORONARY STENT INTERVENTION;  Surgeon: Corky Crafts, MD;  [facility] INV[facility] LAB;  Service: Cardiovascular;  Laterality: N/A;  CORONARY ULTRASOUND/IVUS N/A 12/19/2019   Procedure: Intravascular Ultrasound/IVUS;  Surgeon: Corky Crafts, MD;  Location: [facility] INVASIVE CV LAB;  Service: Cardiovascular;  Laterality: N/A;   ENTEROSCOPY N/A 01/25/2021   Procedure: ENTEROSCOPY;  Surgeon: Sherrilyn Rist, MD;  Location: [facility] ENDOSCOPY;  Service: Gastroenterology;  Laterality: N/A;   ENTEROSCOPY N/A 02/13/2021   Procedure: ENTEROSCOPY;  Surgeon: Lynann Bologna, MD;  Location: [facility] ENDOSCOPY;  Service: Endoscopy;  Laterality: N/A;   ENTEROSCOPY N/A 05/07/2021   Procedure: ENTEROSCOPY;  Surgeon: Benancio Deeds, MD;  Location: [facility] ENDOSCOPY;  Service: Gastroenterology;  Laterality: N/A;   ESOPHAGOGASTRODUODENOSCOPY (EGD) WITH PROPOFOL N/A 02/08/2019   Procedure: ESOPHAGOGASTRODUODENOSCOPY (EGD) WITH PROPOFOL;  Surgeon: Rachael Fee, MD;  Location: [facility] ENDOSCOPY;  Service: Gastroenterology;  Laterality: N/A;   ESOPHAGOGASTRODUODENOSCOPY (EGD) WITH PROPOFOL N/A 12/22/2019   Procedure: ESOPHAGOGASTRODUODENOSCOPY (EGD) WITH PROPOFOL;  Surgeon: Shellia Cleverly, DO;  [facility] ENDOSCOPY;  Service: Gastroenterology;  Laterality: N/A;   ESOPHAGOGASTRODUODENOSCOPY (EGD) WITH  PROPOFOL N/A 10/19/2020   Procedure: ESOPHAGOGASTRODUODENOSCOPY (EGD) WITH PROPOFOL;  Surgeon: Lynann Bologna, MD;  Location: [facility] ENDOSCOPY;  Service: Endoscopy;  Laterality: N/A;   ESOPHAGOGASTRODUODENOSCOPY (EGD) WITH PROPOFOL N/A 12/22/2020   Procedure: ESOPHAGOGASTRODUODENOSCOPY (EGD) WITH PROPOFOL;  Surgeon: Iva Boop, MD;  Location: [facility] ENDOSCOPY;  Service: Endoscopy;  Laterality: N/A;   ESOPHAGOGASTRODUODENOSCOPY (EGD) WITH PROPOFOL N/A 01/09/2021   Procedure: ESOPHAGOGASTRODUODENOSCOPY (EGD) WITH PROPOFOL;  Surgeon: Hilarie Fredrickson, MD;  Location: [facility] ENDOSCOPY;  Service: Endoscopy;  Laterality: N/A;   HEMOSTASIS CLIP PLACEMENT  12/22/2019   Procedure: HEMOSTASIS CLIP PLACEMENT;  Surgeon: Shellia Cleverly, DO;  Location: [facility] ENDOSCOPY;  Service: Gastroenterology;;   HEMOSTASIS CLIP PLACEMENT  12/22/2020   Procedure: HEMOSTASIS CLIP PLACEMENT;  Surgeon: Iva Boop, MD;  Location: [facility] ENDOSCOPY;  Service: Endoscopy;;   HEMOSTASIS CONTROL  12/22/2020   Procedure: HEMOSTASIS CONTROL/hemospray;  Surgeon: Iva Boop, MD;  Location: [facility] ENDOSCOPY;  Service: Endoscopy;;   HOT HEMOSTASIS N/A 02/08/2019   Procedure: HOT HEMOSTASIS (ARGON PLASMA COAGULATION/BICAP);  Surgeon: Rachael Fee, MD;  Location: [facility] ENDOSCOPY;  Service: Gastroenterology;  Laterality: N/A;   HOT HEMOSTASIS N/A 12/22/2019   Procedure: HOT HEMOSTASIS (ARGON PLASMA COAGULATION/BICAP);  Surgeon: Shellia Cleverly, DO;  Location: [facility] ENDOSCOPY;  Service: Gastroenterology;  Laterality: N/A;   HOT HEMOSTASIS N/A 10/19/2020   Procedure: HOT HEMOSTASIS (ARGON PLASMA COAGULATION/BICAP);  Surgeon: Lynann Bologna, MD;  Location: [facility] ENDOSCOPY;  Service: Endoscopy;  Laterality: N/A;   HOT HEMOSTASIS N/A 12/22/2020   Procedure: HOT HEMOSTASIS (ARGON PLASMA COAGULATION/BICAP);  Surgeon: Iva Boop, MD;  Location: [facility] ENDOSCOPY;  Service: Endoscopy;  Laterality: N/A;   HOT HEMOSTASIS N/A 01/09/2021   Procedure: HOT HEMOSTASIS (ARGON PLASMA  COAGULATION/BICAP);  Surgeon: Hilarie Fredrickson, MD;  Location: [facility] ENDOSCOPY;  Service: Endoscopy;  Laterality: N/A;   HOT HEMOSTASIS N/A 01/25/2021   Procedure: HOT HEMOSTASIS (ARGON PLASMA COAGULATION/BICAP);  Surgeon: Sherrilyn Rist, MD;  Location: [facility] ENDOSCOPY;  Service: Gastroenterology;  Laterality: N/A;   HOT HEMOSTASIS N/A 02/13/2021   Procedure: HOT HEMOSTASIS (ARGON PLASMA COAGULATION/BICAP);  Surgeon: Lynann Bologna, MD;  Location: [facility] ENDOSCOPY;  Service: Endoscopy;  Laterality: N/A;   HOT HEMOSTASIS N/A 05/07/2021   Procedure: HOT HEMOSTASIS (ARGON PLASMA COAGULATION/BICAP);  Surgeon: Benancio Deeds, MD;  Location: [facility] ENDOSCOPY;  Service: Gastroenterology;  Laterality: N/A;   INSERTION OF DIALYSIS CATHETER N/A 02/08/2022   Procedure: INSERTION OF TUNNELED DIALYSIS CATHETER;  Surgeon: Chuck Hint, MD;  Location[facility] OR;  Service: Vascular;  Laterality: N/A;   INTERTROCHANTERIC HIP FRACTURE SURGERY Left 11/2006   Hattie Perch 03/08/2011   IR FLUORO GUIDE CV LINE LEFT  03/14/2022   IR FLUORO GUIDE CV LINE RIGHT  07/24/2019   IR FLUORO GUIDE CV LINE RIGHT  07/30/2019   IR US GUIDE VASC ACCESS RIGHT  07/24/2019   IR US GUIDE VASC ACCESS RIGHT  07/30/2019   LAPAROSCOPIC CHOLECYSTECTOMY  05/2006   LIGATION OF ARTERIOVENOUS  FISTULA Left 02/08/2022   Procedure: LIGATION OF LEFT ARTERIOVENOUS  FISTULA;  Surgeon: Chuck Hint, MD;  Location: [facility] OR;  Service: Vascular;  Laterality: Left;   LIGATION OF COMPETING BRANCHES OF ARTERIOVENOUS FISTULA Left 11/05/2018   Procedure: LIGATION OF COMPETING BRANCHES OF ARTERIOVENOUS FISTULA  LEFT  ARM;  Surgeon: Cephus Shelling, MD;  Location: [facility] OR;  Service: Vascular;  Laterality: Left;   LUMBAR LAMINECTOMY/DECOMPRESSION MICRODISCECTOMY N/A 02/29/2016   Procedure: Left L4-5 Lateral Recess Decompression, Removal Extradural Intraspinal Facet Cyst;  Surgeon: Eldred Manges, MD;  Location: [facility] OR;  Service: Orthopedics;  Laterality: N/A;   METATARSAL  HEAD EXCISION Right 07/20/2022   Procedure: Irragation and debridement of right foot wound with extraction of all necrotic soft tissue and bone;  Surgeon: Pilar Plate, DPM;  Location: [facility] OR;  Service: Podiatry;  Laterality: Right;  Right 4th Met head and base of proximal phalanx resection   MULTIPLE TOOTH EXTRACTIONS     ORIF MANDIBULAR FRACTURE Left 08/13/2004   ORIF of left body fracture mandible with KLS Martin 2.3-mm six hole/notes 03/08/2011   RIGHT/LEFT HEART CATH AND CORONARY/GRAFT ANGIOGRAPHY N/A 12/19/2019   Procedure: RIGHT/LEFT HEART CATH AND CORONARY/GRAFT ANGIOGRAPHY;  Surgeon: Corky Crafts, MD;  Location: [facility] INVASIVE CV LAB;  Service: Cardiovascular;  Laterality: N/A;   SCLEROTHERAPY  12/22/2019   Procedure: SCLEROTHERAPY;  Surgeon: Shellia Cleverly, DO;  Location: [facility] ENDOSCOPY;  Service: Gastroenterology;;   Susa Day  02/13/2021   Procedure: Susa Day;  Surgeon: Lynann Bologna, MD;  Location: [facility] ENDOSCOPY;  Service: Endoscopy;;   Social History:   reports that he has been smoking cigarettes. He has a 21.5 pack-year smoking history. He has never been exposed to tobacco smoke. He has never used smokeless tobacco. He reports that he does not currently use alcohol after a past usage of about 12.0 standard drinks of alcohol per week. He reports that he does not currently use drugs after having used the following drugs: Cocaine.  Family History  Problem Relation Age of Onset   Heart failure Father    Hypertension Father    Diabetes Brother    Heart attack Brother    Alzheimer's disease Mother    Stroke Sister    Diabetes Sister    Alzheimer's disease Sister    Hypertension Brother    Diabetes Brother    Drug abuse Brother    Colon cancer Neg Hx     Medications: Patient's Medications  New Prescriptions   No medications on file  Previous Medications   ACETAMINOPHEN (TYLENOL) 650 MG CR TABLET    Take 1,300 mg by mouth 3 (three) times daily.    ALBUTEROL (PROVENTIL) (2.5 MG/3ML) 0.083% NEBULIZER SOLUTION    Take 3 mLs (2.5 mg total) by nebulization every 6 (six) hours as needed for wheezing or shortness of breath.   ALLOPURINOL (ZYLOPRIM) 100 MG TABLET    TAKE 1 TABLET BY MOUTH DAILY   ATORVASTATIN (LIPITOR) 10 MG TABLET    TAKE 1 TABLET(10 MG) BY MOUTH DAILY.  B-D UF III MINI PEN NEEDLES 31G X 5 MM MISC    USE 4 TIMES DAILY   BIKTARVY 50-200-25 MG TABS TABLET    TAKE 1 TABLET BY MOUTH 1 TIME A DAY   CALCIUM ACETATE 667 MG TABS    Take 667-2,001 mg by mouth See admin instructions. Take 2001 mg by mouth three times a day with meals and 667 mg with each snack   CARVEDILOL (COREG) 3.125 MG TABLET    TAKE 1 TABLET(3.125 MG) BY MOUTH TWICE DAILY WITH A MEAL   DICLOFENAC SODIUM (VOLTAREN) 1 % GEL    Apply 2 grams topically to the affected area three times daily as needed for pain.   DOXYCYCLINE (VIBRA-TABS) 100 MG TABLET    Take 1 tablet (100 mg total) by mouth 2 (two) times daily for 7 days.   FOLIC ACID (FOLVITE) 1 MG TABLET    TAKE 1 TABLET(1 MG) BY MOUTH DAILY   GABAPENTIN (NEURONTIN) 300 MG CAPSULE    TAKE 1 CAPSULE BY MOUTH DAILY.   GENTAMICIN OINTMENT (GARAMYCIN) 0.1 %    Apply 1 Application topically daily.   GLUCOSE BLOOD (ONETOUCH ULTRA) TEST STRIP    Check blood sugar three times daily E11.40   HUMALOG KWIKPEN 100 UNIT/ML KWIKPEN    INJECT 20 UNITS UNDER THE SKIN 3 TIMES DAILY AS NEEDED FOR HIGH BLOOD SUGAR ABOVE 150   ISOSORBIDE MONONITRATE (IMDUR) 30 MG 24 HR TABLET    Take 1 tablet (30 mg total) by mouth daily.   LANCETS (ONETOUCH DELICA PLUS LANCET33G) MISC    USE 3 TIMES DAILY   LATANOPROST (XALATAN) 0.005 % OPHTHALMIC SOLUTION    Place 1 drop into both eyes at bedtime.   LIDOCAINE-PRILOCAINE (EMLA) CREAM    Apply topically as directed. At dialysis   MENTHOL-CAMPHOR (TIGER BALM ARTHRITIS RUB) 11-11 % CREA    Apply 1 Application topically as needed (pain).   METHOXY PEG-EPOETIN BETA (MIRCERA IJ)    as directed. At Dialysis    MIDODRINE (PROAMATINE) 10 MG TABLET    Take 10 mg by mouth 3 (three) times a week.   MULTIVITAMIN (RENA-VIT) TABS TABLET    Take 1 tablet by mouth daily.   OCTREOTIDE (SANDOSTATIN LAR DEPOT) 10 MG INJECTION    Inject 10 mg into the muscle every 28 days.   OXYCODONE-ACETAMINOPHEN (PERCOCET) 5-325 MG TABLET    Take 1 tablet by mouth every 8 (eight) hours as needed for severe pain (pain score 7-10).   PREVIDENT 5000 PLUS 1.1 % CREA DENTAL CREAM    Place 1 Application onto teeth at bedtime.   TIMOLOL (TIMOPTIC) 0.5 % OPHTHALMIC SOLUTION    Place 1 drop into both eyes in the morning.   UMECLIDINIUM-VILANTEROL (ANORO ELLIPTA) 62.5-25 MCG/ACT AEPB    Inhale 1 puff into the lungs daily.  Modified Medications   No medications on file  Discontinued Medications   No medications on file    Physical Exam: Vitals:   12/26/23 0900  BP: 100/60  Pulse: 91  Resp: 17  Temp: (!) 97.3 F (36.3 C)  SpO2: 95%  Weight: 201 lb 12.8 oz (91.5 kg)  Height: 6\' 1"  (1.854 m)   Body mass index is 26.62 kg/m. BP Readings from Last 3 Encounters:  12/26/23 100/60  12/22/23 102/64  12/21/23 109/66   Wt Readings from Last 3 Encounters:  12/26/23 201 lb 12.8 oz (91.5 kg)  12/22/23 197 lb (89.4 kg)  12/14/23 198 lb 6.6 oz (90 kg)  Physical Exam Constitutional:      Appearance: Normal appearance.  HENT:     Head: Normocephalic and atraumatic.  Cardiovascular:     Rate and Rhythm: Normal rate and regular rhythm.     Pulses: Normal pulses.     Heart sounds: Normal heart sounds.  Pulmonary:     Effort: No respiratory distress.     Breath sounds: No stridor. No wheezing or rales.  Abdominal:     General: Bowel sounds are normal. There is no distension.     Palpations: Abdomen is soft.     Tenderness: There is no abdominal tenderness. There is no right CVA tenderness or guarding.  Musculoskeletal:     Comments: Rt leg - ankle  2 cm open wound with slight drainage  No surrounding erythema or  tenderness  Superficial linear cut on his Upper thigh from scratching  No signs of infection  Neurological:     Mental Status: He is alert. Mental status is at baseline.     Labs reviewed: Basic Metabolic Panel: Recent Labs    06/15/23 0401 06/16/23 0735 06/21/23 0134 10/01/23 1818 10/01/23 1840 12/07/23 1142  NA 132* 132* 135 136 135 140  K 5.0 5.5* 5.0 5.7* 5.5* 4.3  CL 90* 92* 89* 96*  --  100  CO2 26 24 26 26   --   --   GLUCOSE 137* 128* 230* 189*  --  117*  BUN 33* 60* 65* 47*  --  34*  CREATININE 6.71* 9.31* 8.91* 9.97*  --  8.30*  CALCIUM 8.6* 9.1 9.6 9.6  --   --   MG  --   --   --  2.6*  --   --   PHOS 4.2 5.7*  --   --   --   --    Liver Function Tests: Recent Labs    02/02/23 0000 06/15/23 0401 06/16/23 0735 10/01/23 1818  AST  --   --   --  34  ALT  --   --   --  16  ALKPHOS 103  --   --  98  BILITOT  --   --   --  0.8  PROT  --   --   --  7.9  ALBUMIN 4.1 2.8* 3.1* 3.2*   No results for input(s): "LIPASE", "AMYLASE" in the last 8760 hours. No results for input(s): "AMMONIA" in the last 8760 hours. CBC: Recent Labs    02/02/23 0000 06/14/23 0624 06/15/23 0401 06/16/23 0933 06/21/23 0134 10/01/23 1818 10/01/23 1840 12/07/23 1142  WBC 5.6 4.9   < > 4.5 5.2 5.7  --   --   NEUTROABS 49.70 3.0  --   --   --  3.1  --   --   HGB 12.5* 10.3*   < > 9.9* 9.8* 11.0* 11.6* 11.9*  HCT 37* 32.1*   < > 30.6* 31.4* 34.3* 34.0* 35.0*  MCV  --  107.0*   < > 107.4* 107.2* 108.5*  --   --   PLT 146* 150   < > 131* 157 126*  --   --    < > = values in this interval not displayed.   Lipid Panel: No results for input(s): "CHOL", "HDL", "LDLCALC", "TRIG", "CHOLHDL", "LDLDIRECT" in the last 8760 hours. TSH: No results for input(s): "TSH" in the last 8760 hours. A1C: Lab Results  Component Value Date   HGBA1C 6.8 (H) 06/15/2023    Assessment and Plan  1. Type 2 diabetes mellitus with diabetic polyneuropathy, with long-term current use of insulin  (HCC) (Primary)  Pt reports blood sugars running in 80's He is ESRD Will start tresiba 10 unist daily Lower humalog to 3-5 units with each meal  Instructed to check bg tid and bring BG log next time - Hemoglobin A1C - Lipid Panel - insulin degludec (TRESIBA) 100 UNIT/ML FlexTouch Pen; Inject 10 Units into the skin daily. 3  Dispense: 9 mL; Refill: 3 - HUMALOG KWIKPEN 100 UNIT/ML KwikPen; Inject 5-8 Units into the skin 3 (three) times daily.  Dispense: 45 mL; Refill: 3  2. ESRD on hemodialysis (HCC) Follow up with nephrology  3. Chronic pain syndrome Informed patient that he needs to see pain clinic for future refills - Ambulatory referral to Pain Clinic - Drug Tox Monitor 1 w/Conf, Oral Fld  4. Chronic bilateral low back pain without sciatica  No red lag signs Take tylenol 1000mg  tid  Limit oxycodone  5. Open wound No signs of cellulitis Instructed patient to use neosporin  - neomycin-bacitracin-polymyxin [phone] OINT; Apply 1 Application topically in the morning and at bedtime.  Dispense: 15 g; Refill: 0      40 min Total time spent for obtaining history,  performing a medically appropriate examination and evaluation, reviewing the tests,ordering  tests,  documenting clinical information in the electronic or other health record,   ,care coordination (not separately reported)      No follow-ups on file.:   Jacob [name]

## 2023-12-27 ENCOUNTER — Emergency Department (HOSPITAL_COMMUNITY)

## 2023-12-27 ENCOUNTER — Encounter (HOSPITAL_COMMUNITY): Payer: Self-pay

## 2023-12-27 ENCOUNTER — Emergency Department (HOSPITAL_COMMUNITY)
Admission: EM | Admit: 2023-12-27 | Discharge: 2023-12-27 | Disposition: A | Discharge status: Home / Self Care | Attending: Emergency Medicine | Admitting: Emergency Medicine

## 2023-12-27 ENCOUNTER — Ambulatory Visit (HOSPITAL_COMMUNITY): Admission: EM | Admit: 2023-12-27 | Discharge: 2023-12-27 | Disposition: A | Discharge status: Home / Self Care

## 2023-12-27 DIAGNOSIS — R197 Diarrhea, unspecified: Secondary | ICD-10-CM | POA: Diagnosis not present

## 2023-12-27 DIAGNOSIS — N2581 Secondary hyperparathyroidism of renal origin: Secondary | ICD-10-CM | POA: Diagnosis not present

## 2023-12-27 DIAGNOSIS — S2241XA Multiple fractures of ribs, right side, initial encounter for closed fracture: Secondary | ICD-10-CM | POA: Diagnosis not present

## 2023-12-27 DIAGNOSIS — Z794 Long term (current) use of insulin: Secondary | ICD-10-CM | POA: Insufficient documentation

## 2023-12-27 DIAGNOSIS — R5383 Other fatigue: Secondary | ICD-10-CM

## 2023-12-27 DIAGNOSIS — I132 Hypertensive heart and chronic kidney disease with heart failure and with stage 5 chronic kidney disease, or end stage renal disease: Secondary | ICD-10-CM | POA: Diagnosis not present

## 2023-12-27 DIAGNOSIS — I2510 Atherosclerotic heart disease of native coronary artery without angina pectoris: Secondary | ICD-10-CM | POA: Insufficient documentation

## 2023-12-27 DIAGNOSIS — Z79899 Other long term (current) drug therapy: Secondary | ICD-10-CM | POA: Insufficient documentation

## 2023-12-27 DIAGNOSIS — K4090 Unilateral inguinal hernia, without obstruction or gangrene, not specified as recurrent: Secondary | ICD-10-CM | POA: Diagnosis not present

## 2023-12-27 DIAGNOSIS — R4182 Altered mental status, unspecified: Secondary | ICD-10-CM | POA: Diagnosis not present

## 2023-12-27 DIAGNOSIS — Z992 Dependence on renal dialysis: Secondary | ICD-10-CM | POA: Insufficient documentation

## 2023-12-27 DIAGNOSIS — N186 End stage renal disease: Secondary | ICD-10-CM | POA: Insufficient documentation

## 2023-12-27 DIAGNOSIS — R918 Other nonspecific abnormal finding of lung field: Secondary | ICD-10-CM | POA: Diagnosis not present

## 2023-12-27 DIAGNOSIS — R0602 Shortness of breath: Secondary | ICD-10-CM | POA: Insufficient documentation

## 2023-12-27 DIAGNOSIS — I672 Cerebral atherosclerosis: Secondary | ICD-10-CM | POA: Diagnosis not present

## 2023-12-27 DIAGNOSIS — R410 Disorientation, unspecified: Secondary | ICD-10-CM | POA: Insufficient documentation

## 2023-12-27 DIAGNOSIS — R109 Unspecified abdominal pain: Secondary | ICD-10-CM | POA: Diagnosis not present

## 2023-12-27 DIAGNOSIS — M7989 Other specified soft tissue disorders: Secondary | ICD-10-CM | POA: Diagnosis not present

## 2023-12-27 DIAGNOSIS — L98499 Non-pressure chronic ulcer of skin of other sites with unspecified severity: Secondary | ICD-10-CM | POA: Diagnosis not present

## 2023-12-27 DIAGNOSIS — I509 Heart failure, unspecified: Secondary | ICD-10-CM | POA: Insufficient documentation

## 2023-12-27 DIAGNOSIS — Z955 Presence of coronary angioplasty implant and graft: Secondary | ICD-10-CM | POA: Diagnosis not present

## 2023-12-27 DIAGNOSIS — D688 Other specified coagulation defects: Secondary | ICD-10-CM | POA: Diagnosis not present

## 2023-12-27 DIAGNOSIS — K5730 Diverticulosis of large intestine without perforation or abscess without bleeding: Secondary | ICD-10-CM | POA: Diagnosis not present

## 2023-12-27 DIAGNOSIS — D631 Anemia in chronic kidney disease: Secondary | ICD-10-CM | POA: Diagnosis not present

## 2023-12-27 DIAGNOSIS — J449 Chronic obstructive pulmonary disease, unspecified: Secondary | ICD-10-CM | POA: Diagnosis not present

## 2023-12-27 DIAGNOSIS — N2889 Other specified disorders of kidney and ureter: Secondary | ICD-10-CM | POA: Diagnosis not present

## 2023-12-27 LAB — RESP PANEL BY RT-PCR (RSV, FLU A&B, COVID)  RVPGX2
Influenza A by PCR: NEGATIVE
Influenza B by PCR: NEGATIVE
Resp Syncytial Virus by PCR: NEGATIVE
SARS Coronavirus 2 by RT PCR: NEGATIVE

## 2023-12-27 LAB — I-STAT CG4 LACTIC ACID, ED
Lactic Acid, Venous: 1.4 mmol/L (ref 0.5–1.9)
Lactic Acid, Venous: 1.3 mmol/L (ref 0.5–1.9)

## 2023-12-27 LAB — CBC
HCT: 36.9 % — ABNORMAL LOW (ref 39.0–52.0)
Hemoglobin: 11.8 g/dL — ABNORMAL LOW (ref 13.0–17.0)
MCH: 33.8 pg (ref 26.0–34.0)
MCHC: 32 g/dL (ref 30.0–36.0)
MCV: 105.7 fL — ABNORMAL HIGH (ref 80.0–100.0)
Platelets: 163 10*3/uL (ref 150–400)
RBC: 3.49 MIL/uL — ABNORMAL LOW (ref 4.22–5.81)
RDW: 14.8 % (ref 11.5–15.5)
WBC: 6.1 10*3/uL (ref 4.0–10.5)
nRBC: 0 % (ref 0.0–0.2)

## 2023-12-27 LAB — HEMOGLOBIN A1C
Hgb A1c MFr Bld: 7.7 %{Hb} — ABNORMAL HIGH (ref <5.7)
Mean Plasma Glucose: 174 mg/dL
eAG (mmol/L): 9.7 mmol/L

## 2023-12-27 LAB — HEPATIC FUNCTION PANEL
ALT: 18 U/L (ref 0–44)
AST: 21 U/L (ref 15–41)
Albumin: 3.1 g/dL — ABNORMAL LOW (ref 3.5–5.0)
Alkaline Phosphatase: 68 U/L (ref 38–126)
Bilirubin, Direct: 0.1 mg/dL (ref 0.0–0.2)
Indirect Bilirubin: 0.6 mg/dL (ref 0.3–0.9)
Total Bilirubin: 0.7 mg/dL (ref 0.0–1.2)
Total Protein: 8.6 g/dL — ABNORMAL HIGH (ref 6.5–8.1)

## 2023-12-27 LAB — LIPID PANEL
Cholesterol: 131 mg/dL (ref <200)
HDL: 36 mg/dL — ABNORMAL LOW (ref >=40)
LDL Cholesterol (Calc): 75 mg/dL
Non-HDL Cholesterol (Calc): 95 mg/dL (ref <130)
Total CHOL/HDL Ratio: 3.6 (calc) (ref <5.0)
Triglycerides: 122 mg/dL (ref <150)

## 2023-12-27 LAB — BASIC METABOLIC PANEL
Anion gap: 18 — ABNORMAL HIGH (ref 5–15)
BUN: 28 mg/dL — ABNORMAL HIGH (ref 8–23)
CO2: 28 mmol/L (ref 22–32)
Calcium: 8.9 mg/dL (ref 8.9–10.3)
Chloride: 93 mmol/L — ABNORMAL LOW (ref 98–111)
Creatinine, Ser: 6.56 mg/dL — ABNORMAL HIGH (ref 0.61–1.24)
GFR, Estimated: 8 mL/min — ABNORMAL LOW (ref >60)
Glucose, Bld: 106 mg/dL — ABNORMAL HIGH (ref 70–99)
Potassium: 4.2 mmol/L (ref 3.5–5.1)
Sodium: 139 mmol/L (ref 135–145)

## 2023-12-27 LAB — ETHANOL: Alcohol, Ethyl (B): <10 mg/dL (ref <10)

## 2023-12-27 LAB — AMMONIA: Ammonia: 34 umol/L (ref 9–35)

## 2023-12-27 LAB — CBG MONITORING, ED: Glucose-Capillary: 109 mg/dL — ABNORMAL HIGH (ref 70–99)

## 2023-12-27 NOTE — ED Notes (Signed)
 Patient transported to CT with CT staff

## 2023-12-27 NOTE — ED Triage Notes (Addendum)
 Pt c/o R index finger wound x3 weeks and stumble and fall x3 days ago.  Denies hitting head and LOC.  Pain score 4/10.  Pt reports fall was caused d/t "missed reaching for his walker."  Pt is A&Ox4. Sts he was "a little confused after dialysis this morning."       Pt reports being fatigued from taking oxycodone after dialysis and having doctor's appointments all day yesterday.

## 2023-12-27 NOTE — ED Triage Notes (Signed)
 Pt states that his middle right finger is infected. X2 weeks

## 2023-12-27 NOTE — ED Provider Notes (Signed)
 [place] EMERGENCY DEPARTMENT AT [facility] Provider Note   CSN: [id number] Arrival date & time: 12/27/23  1213     History  Chief Complaint  Patient presents with   Wound Check   Fall    Jacob [name] is a 73 y.o. male.  Patient is a 73 year old male with history of end-stage renal disease on dialysis Monday Wednesday Friday, CHF, COPD, coronary artery disease, HIV, substance abuse who presents with confusion and shortness of breath.  He is here with a friend who is at bedside.  The friend is noted him to have some increased shortness of breath over the last couple days and also has had some swelling of his abdomen and his legs.  He was at dialysis today and they noted him to be confused.  He at times did not know what day it was.  He has not had any noted fevers.  No vomiting or diarrhea.  He does complain of some abdominal discomfort.  He does not produce urine.  He denies any cough or chest congestion.  He has noted that his legs have been more swollen.  He has a wound on his right middle finger that has been there for about 2 weeks.  He says he cut it when he was trying to reach for his wheelchair.  He also has some wounds on his feet that are being followed by primary care.       Home Medications Prior to Admission medications   Medication Sig Start Date End Date Taking? Authorizing Provider  acetaminophen (TYLENOL) 650 MG CR tablet Take 1,300 mg by mouth 3 (three) times daily.    [provider]  albuterol (PROVENTIL) (2.5 MG/3ML) 0.083% nebulizer solution Take 3 mLs (2.5 mg total) by nebulization every 6 (six) hours as needed for wheezing or shortness of breath. 06/20/23 06/19/24  [name], Dinah C, NP  allopurinol (ZYLOPRIM) 100 MG tablet TAKE 1 TABLET BY MOUTH DAILY 11/01/23   [name], MD  atorvastatin (LIPITOR) 10 MG tablet TAKE 1 TABLET(10 MG) BY MOUTH DAILY. 11/23/23   Gaston Islam., NP  B-D UF III MINI PEN NEEDLES 31G X 5 MM MISC USE 4  TIMES DAILY 07/13/23   [name], MD  BIKTARVY 50-200-25 MG TABS tablet TAKE 1 TABLET BY MOUTH 1 TIME A DAY 11/13/23   Gardiner Barefoot, MD  Calcium Acetate 667 MG TABS Take 667-2,001 mg by mouth See admin instructions. Take 2001 mg by mouth three times a day with meals and 667 mg with each snack 01/20/21   [provider]  carvedilol (COREG) 3.125 MG tablet TAKE 1 TABLET(3.125 MG) BY MOUTH TWICE DAILY WITH A MEAL 11/28/23   Gaston Islam., NP  diclofenac Sodium (VOLTAREN) 1 % GEL Apply 2 grams topically to the affected area three times daily as needed for pain. 12/19/22   [name], MD  doxycycline (VIBRA-TABS) 100 MG tablet Take 1 tablet (100 mg total) by mouth 2 (two) times daily for 7 days. 12/24/23 12/31/23  [name], Dinah C, NP  folic acid (FOLVITE) 1 MG tablet TAKE 1 TABLET(1 MG) BY MOUTH DAILY 11/27/23   [name], MD  gabapentin (NEURONTIN) 300 MG capsule TAKE 1 CAPSULE BY MOUTH DAILY. 05/11/22   [name], MD  gentamicin ointment (GARAMYCIN) 0.1 % Apply 1 Application topically daily. 01/06/23   Standiford, Jenelle Mages, DPM  glucose blood (ONETOUCH ULTRA) test strip Check blood sugar three times daily E11.40 05/18/23  [name], MD  HUMALOG KWIKPEN 100 UNIT/ML KwikPen Inject 5-8 Units into the skin 3 (three) times daily. 12/26/23   [name], MD  insulin degludec (TRESIBA) 100 UNIT/ML FlexTouch Pen Inject 10 Units into the skin daily. 3 12/26/23   [name], MD  isosorbide mononitrate (IMDUR) 30 MG 24 hr tablet Take 1 tablet (30 mg total) by mouth daily. 06/05/23   [name], MD  Lancets [facility] DELICA PLUS [place]) MISC USE 3 TIMES DAILY 08/15/22   [name], MD  latanoprost (XALATAN) 0.005 % ophthalmic solution Place 1 drop into both eyes at bedtime.    [provider]  lidocaine-prilocaine (EMLA) cream Apply topically as directed. At dialysis 10/30/23   [provider]  Menthol-Camphor  (TIGER BALM ARTHRITIS RUB) 11-11 % CREA Apply 1 Application topically as needed (pain).    [provider]  Methoxy PEG-Epoetin Beta (MIRCERA IJ) as directed. At Dialysis 11/08/23 11/06/24  [provider]  midodrine (PROAMATINE) 10 MG tablet Take 10 mg by mouth 3 (three) times a week. 03/21/23   [provider]  multivitamin (RENA-VIT) TABS tablet Take 1 tablet by mouth daily.    [provider]  neomycin-bacitracin-polymyxin [id number] OINT Apply 1 Application topically in the morning and at bedtime. 12/26/23   [name], MD  octreotide (SANDOSTATIN LAR DEPOT) 10 MG injection Inject 10 mg into the muscle every 28 days. 08/22/23   Hilarie Fredrickson, MD  oxyCODONE-acetaminophen (PERCOCET) 5-325 MG tablet Take 1 tablet by mouth every 8 (eight) hours as needed for severe pain (pain score 7-10). 12/05/23   [name], MD  PREVIDENT 5000 PLUS 1.1 % CREA dental cream Place 1 Application onto teeth at bedtime. 05/25/23   [provider]  timolol (TIMOPTIC) 0.5 % ophthalmic solution Place 1 drop into both eyes in the morning. 04/04/19   [provider]  umeclidinium-vilanterol (ANORO ELLIPTA) 62.5-25 MCG/ACT AEPB Inhale 1 puff into the lungs daily. 06/07/23   [name], MD      Allergies    Augmentin [amoxicillin-pot clavulanate], Morphine sulfate, Mucinex fast-max, and Amphetamines    Review of Systems   Review of Systems  Constitutional:  Positive for fatigue. Negative for chills, diaphoresis and fever.  HENT:  Negative for congestion, rhinorrhea and sneezing.   Eyes: Negative.   Respiratory:  Positive for shortness of breath. Negative for cough and chest tightness.   Cardiovascular:  Negative for chest pain and leg swelling.  Gastrointestinal:  Positive for abdominal distention and abdominal pain. Negative for blood in stool, diarrhea, nausea and vomiting.  Genitourinary:  Negative for difficulty urinating, flank pain,  frequency and hematuria.  Musculoskeletal:  Negative for arthralgias and back pain.  Skin:  Negative for rash.  Neurological:  Negative for dizziness, speech difficulty, weakness, numbness and headaches.  Psychiatric/Behavioral:  Positive for confusion.     Physical Exam Updated Vital Signs BP 110/65   Pulse 71   Temp 97.7 F (36.5 C)   Resp 17   SpO2 98%  Physical Exam Constitutional:      Appearance: He is well-developed.  HENT:     Head: Normocephalic and atraumatic.  Eyes:     Pupils: Pupils are equal, round, and reactive to light.  Cardiovascular:     Rate and Rhythm: Normal rate and regular rhythm.     Heart sounds: Normal heart sounds.  Pulmonary:     Effort: Pulmonary effort is normal. No respiratory distress.     Breath sounds:  Normal breath sounds. No wheezing or rales.  Chest:     Chest wall: No tenderness.  Abdominal:     General: Bowel sounds are normal.     Palpations: Abdomen is soft.     Tenderness: There is abdominal tenderness (Generalized tenderness). There is no guarding or rebound.  Musculoskeletal:        General: Normal range of motion.     Cervical back: Normal range of motion and neck supple.     Comments: Small ulcerations to his ankles bilaterally which appear to be well-healing without suggestions of surrounding cellulitis.  He has not open healing wound to the distal aspect of his right middle finger.  There is some exudative material but no signs of cellulitis.  No purulent discharge.  2-3+ pitting edema to lower extremities bilaterally  Lymphadenopathy:     Cervical: No cervical adenopathy.  Skin:    General: Skin is warm and dry.     Findings: No rash.  Neurological:     General: No focal deficit present.     Mental Status: He is alert and oriented to person, place, and time.     ED Results / Procedures / Treatments   Labs (all labs ordered are listed, but only abnormal results are displayed) Labs Reviewed  BASIC METABOLIC PANEL -  Abnormal; Notable for the following components:      Result Value   Chloride 93 (*)    Glucose, Bld 106 (*)    BUN 28 (*)    Creatinine, Ser 6.56 (*)    GFR, Estimated 8 (*)    Anion gap 18 (*)    All other components within normal limits  CBC - Abnormal; Notable for the following components:   RBC 3.49 (*)    Hemoglobin 11.8 (*)    HCT 36.9 (*)    MCV 105.7 (*)    All other components within normal limits  HEPATIC FUNCTION PANEL - Abnormal; Notable for the following components:   Total Protein 8.6 (*)    Albumin 3.1 (*)    All other components within normal limits  CBG MONITORING, ED - Abnormal; Notable for the following components:   Glucose-Capillary 109 (*)    All other components within normal limits  RESP PANEL BY RT-PCR (RSV, FLU A&B, COVID)  [id number]  AMMONIA  ETHANOL  I-STAT CG4 LACTIC ACID, ED  I-STAT CG4 LACTIC ACID, ED    EKG EKG Interpretation Date/Time:  Wednesday December 27 2023 12:53:51 EST Ventricular Rate:  69 PR Interval:  192 QRS Duration:  158 QT Interval:  494 QTC Calculation: 529 R Axis:   -52  Text Interpretation: Sinus rhythm with Fusion complexes and Premature atrial complexes Right bundle branch block Left anterior fascicular block Bifascicular block Left ventricular hypertrophy with repolarization abnormality ( R in aVL , Romhilt-Estes ) Inferior infarct , age undetermined Abnormal ECG When compared with ECG of 01-Oct-2023 18:01, PREVIOUS ECG IS PRESENT since last tracing no significant change Confirmed by Rolan Bucco [phone]) on 12/27/2023 2:33:54 PM  Radiology DG Finger Middle Right Result Date: 12/27/2023 CLINICAL DATA:  Ulcer of the distal at finger. EXAM: RIGHT MIDDLE FINGER 3 COMPARISON:  None Available. FINDINGS: No fracture or dislocation. Preserved joint spaces and bone mineralization. No erosive changes identified. Chondrocalcinosis seen about the second and third metacarpophalangeal joint at the edge of the imaging field. If there is  further concern of infection or other process recommend additional workup such as bone scan or MRI as clinically appropriate  for further sensitivity. IMPRESSION: No acute osseous abnormality. Electronically Signed   By: Karen Kays M.D.   On: 12/27/2023 16:03   CT Head Wo Contrast Result Date: 12/27/2023 CLINICAL DATA:  Altered mental status. Dialysis patient. Abnormal behavior. EXAM: CT HEAD WITHOUT CONTRAST TECHNIQUE: Contiguous axial images were obtained from the base of the skull through the vertex without intravenous contrast. RADIATION DOSE REDUCTION: This exam was performed according to the departmental dose-optimization program which includes automated exposure control, adjustment of the mA and/or kV according to patient size and/or use of iterative reconstruction technique. COMPARISON:  11/06/2020 FINDINGS: Brain: Generalized brain atrophy. No evidence of old or acute focal infarction, mass lesion, hemorrhage, hydrocephalus or extra-axial collection. Vascular: There is atherosclerotic calcification of the major vessels at the base of the brain. Skull: Negative Sinuses/Orbits: Clear/normal Other: None IMPRESSION: No acute CT finding. Generalized brain atrophy. Atherosclerotic calcification of the major vessels at the base of the brain. Electronically Signed   By: Paulina Fusi M.D.   On: 12/27/2023 15:56    Procedures Procedures    Medications Ordered in ED Medications - No data to display  ED Course/ Medical Decision Making/ A&P Clinical Course as of 12/27/23 1632  Wed Dec 27, 2023  1629 Handoff MB 73 yo male from HD AMS Workup so far looks okay Pending CTAP If feeling better can go home, he is Aox3 per prior team [SG]    Clinical Course User Index [SG] Sloan Leiter, DO                                 Medical Decision Making Amount and/or Complexity of Data Reviewed Labs: ordered. Radiology: ordered.   Patient is a 74 year old male who presents with some confusion and  shortness of breath.  He is oriented on my exam.  I do not find any focal deficits.  He has some vague symptoms including some shortness of breath and some abdominal discomfort and feels like he is swollen.  Labs so far are nonconcerning.  He is afebrile.  He does not have any concerns for stroke.  Head CT does not show any acute abnormality.  No intracranial hemorrhage.  Chest x-ray was interpreted by me and does not show any obvious infection or pulmonary edema.  Still waiting over read by the radiologist.  X-ray of the middle finger does not show any foreign body or bony destruction.  This was interpreted by me and confirmed by the radiologist.  He does have a history of substance abuse.  Not clear if this contributed to his symptoms today.  Awaiting the rest of his labs and imaging results.  Care turned over to Dr. Wallace Cullens pending reassessment after these results.  Final Clinical Impression(s) / ED Diagnoses Final diagnoses:  None    Rx / DC Orders ED Discharge Orders     None         Rolan Bucco, MD 12/27/23 [phone]

## 2023-12-27 NOTE — ED Provider Triage Note (Signed)
 Emergency Medicine Provider Triage Evaluation Note  Jacob [name] , a 73 y.o. male  was evaluated in triage.  Pt complains of drowsiness.  He had dialysis today, did not complete the full session and finished approximately 30 minutes early.  States he typically takes a nap after dialysis.  He is more drowsy than he normally is.  He reports mild pain to bilateral legs.  No nausea or vomiting.  No fevers.  He is also complaining of pain to the right middle finger.  He has had wound for approximately 3 weeks.  He had a fall 3 days ago and states that it this was due to him missing his wheelchair when he reached out for.  Review of Systems  Positive: As above Negative: As above  Physical Exam  BP 105/63 (BP Location: Left Arm)   Pulse 70   Temp 97.7 F (36.5 C)   Resp 16   SpO2 95%  Gen:   Awake, no distress   Resp:  Normal effort  MSK:   Moves extremities without difficulty  Other:  Ulceration to right middle finger overlying proximal phalanx  Medical Decision Making  Medically screening exam initiated at 12:43 PM.  Appropriate orders placed.  David Stall was informed that the remainder of the evaluation will be completed by another provider, this initial triage assessment does not replace that evaluation, and the importance of remaining in the ED until their evaluation is complete.  Workup initiated   [name] Piper, Cordelia Poche 12/27/23 1244

## 2023-12-27 NOTE — ED Provider Notes (Signed)
 [facility]    CSN: [id number] Arrival date & time: 12/27/23  1115      History   Chief Complaint Chief Complaint  Patient presents with   finger problem    Middle right finger infection   Altered Mental Status    HPI JALEIL RENWICK is a 73 y.o. male.   Patient brought into clinic by a caregiver.  Patient was at dialysis earlier today and had to leave 30 minutes early due to abdominal pain.  Caregiver reports he is normally not as distended in his abdomen or his legs are not usually this swollen.  Patient endorses shortness of breath.  Caregiver also reports patient is confused.  Does know his full name and date of birth.  Patient did have a fall on Sunday, reports bilateral knee pain with this.  He is ambulatory with a walker.  Caregiver concerned for UTI, reports patient does not normally make urine.   Also concerned about a nonhealing ulcer to his middle finger.  Has not been painful, warm, swollen or draining.  History of COPD, drug abuse, end-stage renal disease on dialysis, gout, hypertension, type 2 diabetes, IDA, CHF, diabetic foot ulcer, HIV and other comorbid conditions.   The history is provided by the patient, medical records and a caregiver.  Altered Mental Status   Past Medical History:  Diagnosis Date   Acute respiratory failure (HCC) 03/01/2018   Anemia    Arthritis    "all over; mostly knees and back" (02/28/2018)   Chronic combined systolic and diastolic CHF (congestive heart failure) (HCC)    Chronic lower back pain    stenosis   Community acquired pneumonia 09/06/2013   COPD (chronic obstructive pulmonary disease) (HCC)    Coronary atherosclerosis of native coronary artery    a. 02/2003 s/p CABG x 2 (VG->RI, VG->RPDA; b. 11/2019 PCI: LM nl, LAD 90d, D3 50, RI 100, LCX 100p, OM3 100 - fills via L->L collats from D2/dLAD, RCA 100p, VG->RPDA ok, VG->RI 95 (3.5x48 Synergy XD DES).   Drug abuse (HCC)    hx; tested for cocaine as recently as 2/08.  says he is not using drugs now - avoided defib. for this reason    ESRD (end stage renal disease) (HCC)    Hemo M-W-F- [facility]   Fall at home 10/2020   GERD (gastroesophageal reflux disease)    takes OTC meds as needed   GI bleeding    a. 11/2019 EGD: angiodysplastic lesions w/ bleeding s/p argon plasma/clipping/epi inj. Multiple admissions for the same.   Glaucoma    uses eye drops daily   Hepatitis B 1968   "tx'd w/isolation; caught it from toilet stools in gym"   History of blood transfusion 03/01/2019   History of colon polyps    benign   History of gout    takes Allopurinol daily as well as Colchicine-if needed (02/28/2018)   History of kidney stones    HTN (hypertension)    takes Coreg,Imdur.and Apresoline daily   Human immunodeficiency virus (HIV) disease (HCC) dx'd 1995   on Biktarvy as of 12/2020.     Hyperlipidemia    Ischemic cardiomyopathy    a. 01/2019 Echo: EF 40-45%, diffuse HK, mild basal septal hypertrophy. Diast dysfxn. Nl RV size/fxn. Sev dil LA. Triv MR/TR/PR.   Muscle spasm    takes Zanaflex as needed   Myocardial infarction (HCC) ~ 2004/2005   Nocturia    Peripheral neuropathy    takes gabapentin daily  Pneumonia    "at least twice" (02/28/2018)   SDH (subdural hematoma) (HCC)    Syphilis, unspecified    Type II diabetes mellitus (HCC) 2004   Lantus daily.Average fasting blood sugar 125-199   Wears glasses    Wears partial dentures     Patient Active Problem List   Diagnosis Date Noted   Fluid overload 06/14/2023   Routine screening for STI (sexually transmitted infection) 03/07/2023   Diabetic infection of right foot (HCC) 07/18/2022   Chronic pain syndrome 03/29/2022   Chronic lower back pain    Thrombocytopenia (HCC)    Hypercalcemia 04/13/2021   Helicobacter pylori (H. pylori) infection 04/06/2021   Lumbar foraminal stenosis 03/30/2021   Gastric hemorrhage due to angiodysplasia of stomach    GI bleed 11/24/2020   COVID-19 virus infection  11/24/2020   Other mechanical complication of cranial or spinal infusion catheter, sequela 11/11/2020   Subdural hematoma (HCC) 11/06/2020   Fall at home, initial encounter 11/05/2020   Nicotine dependence, cigarettes, uncomplicated 11/05/2020   Alcohol abuse 11/05/2020   Unspecified trochanteric fracture of left femur, initial encounter for closed fracture (HCC) 11/05/2020   Traumatic subdural hematoma, initial encounter (HCC) 11/05/2020   Fracture of metatarsal of right foot, closed 11/05/2020   Nondisplaced fracture of greater trochanter of left femur, initial encounter for closed fracture (HCC)    Uremia    Angiodysplasia of intestine with hemorrhage    Pruritus, unspecified 10/02/2020   Iron deficiency anemia    Acute blood loss anemia    Acute on chronic anemia    Abnormal nuclear stress test 12/19/2019   Type 2 diabetes mellitus with diabetic neuropathy, unspecified (HCC) 08/07/2019   Allergy, unspecified, initial encounter 08/02/2019   Complication of vascular dialysis catheter 08/02/2019   Drug abuse counseling and surveillance of drug abuser 08/02/2019   Other specified coagulation defects (HCC) 08/02/2019   Secondary hyperparathyroidism of renal origin (HCC) 08/02/2019   Unspecified atrial fibrillation (HCC) 08/02/2019   ESRD on hemodialysis (HCC)    Cocaine abuse (HCC)    CHF exacerbation (HCC) 07/21/2019   Diabetic foot ulcer (HCC) 02/11/2019   AVM (arteriovenous malformation)    Melena 02/07/2019   Symptomatic anemia 02/06/2019   Hyperlipidemia associated with type 2 diabetes mellitus (HCC) 05/30/2018   Right foot ulcer (HCC) 02/28/2018   Chronic obstructive pulmonary disease (HCC) 02/28/2018   Anemia of chronic disease 11/20/2016   CHF (congestive heart failure) (HCC) 11/10/2016   History of lumbar laminectomy for spinal cord decompression 02/29/2016   Type 2 diabetes mellitus with diabetic polyneuropathy, with long-term current use of insulin (HCC) 06/17/2015    HTN (hypertension) 04/26/2015   DM (diabetes mellitus) (HCC) 04/26/2015   Ischemic cardiomyopathy 05/12/2014   Hyperkalemia 09/06/2013   DM II, diet controlled 12/30/2011   Bunion of left foot 11/25/2011   Bunion, right foot 11/25/2011   Polysubstance abuse (HCC) 07/28/2011   Hip fracture, left (HCC) 04/04/2011   Closed fracture of neck of femur (HCC) 04/04/2011   Insomnia 02/18/2011   Coronary artery disease involving native heart without angina pectoris 04/20/2009   Chronic combined systolic (congestive) and diastolic (congestive) heart failure (HCC) 04/20/2009   Mixed hyperlipidemia 11/20/2006   Gout 11/20/2006   TOBACCO ABUSE 11/20/2006   Essential hypertension 11/20/2006   Human immunodeficiency virus (HIV) disease (HCC) 09/02/2006    Past Surgical History:  Procedure Laterality Date   A/V FISTULAGRAM N/A 12/07/2023   Procedure: A/V Fistulagram;  Surgeon: Tyler Pita, MD;  Location: [facility] INVASIVE  CV LAB;  Service: Cardiovascular;  Laterality: N/A;   AV FISTULA PLACEMENT Left 08/02/2018   Procedure: ARTERIOVENOUS (AV) FISTULA CREATION  left arm radiocephlic;  Surgeon: Cephus Shelling, MD;  Location: [facility] OR;  Service: Vascular;  Laterality: Left;   AV FISTULA PLACEMENT Left 08/01/2019   Procedure: LEFT BRACHIOCEPHALIC ARTERIOVENOUS (AV) FISTULA CREATION;  Surgeon: Larina Earthly, MD;  Location: [facility] OR;  Service: Vascular;  Laterality: Left;   AV FISTULA PLACEMENT Right 04/12/2022   Procedure: RIGHT UPPER EXTREMITY ARTERIOVENOUS (AV)  GRAFT INSERTION USING GORE STRETCH 4-7 MM;  Surgeon: Chuck Hint, MD;  Location: [facility] OR;  Service: Vascular;  Laterality: Right;   BASCILIC VEIN TRANSPOSITION Left 10/03/2019   Procedure: BASILIC VEIN TRANSPOSITION LEFT SECOND STAGE;  Surgeon: Larina Earthly, MD;  Location: [place] OR;  Service: Vascular;  Laterality: Left;   BIOPSY  01/25/2021   Procedure: BIOPSY;  Surgeon: Sherrilyn Rist, MD;  Location: [facility] ENDOSCOPY;  Service:  Gastroenterology;;   CARDIAC CATHETERIZATION  10/2002; 12/19/2004   Hattie Perch 03/08/2011   COLONOSCOPY  2013   Dr.John Marina Goodell    CORONARY ARTERY BYPASS GRAFT  02/24/2003   CABG X2/notes 03/08/2011   CORONARY STENT INTERVENTION N/A 12/19/2019   Procedure: CORONARY STENT INTERVENTION;  Surgeon: Corky Crafts, MD;  [facility] INVASIVE CV LAB;  Service: Cardiovascular;  Laterality: N/A;   CORONARY ULTRASOUND/IVUS N/A 12/19/2019   Procedure: Intravascular Ultrasound/IVUS;  Surgeon: Corky Crafts, MD;  Location: [facility] INVASIVE CV LAB;  Service: Cardiovascular;  Laterality: N/A;   ENTEROSCOPY N/A 01/25/2021   Procedure: ENTEROSCOPY;  Surgeon: Sherrilyn Rist, MD;  Location: [facility] ENDOSCOPY;  Service: Gastroenterology;  Laterality: N/A;   ENTEROSCOPY N/A 02/13/2021   Procedure: ENTEROSCOPY;  Surgeon: Lynann Bologna, MD;  Location: [facility] ENDOSCOPY;  Service: Endoscopy;  Laterality: N/A;   ENTEROSCOPY N/A 05/07/2021   Procedure: ENTEROSCOPY;  Surgeon: Benancio Deeds, MD;  Location: [facility] ENDOSCOPY;  Service: Gastroenterology;  Laterality: N/A;   ESOPHAGOGASTRODUODENOSCOPY (EGD) WITH PROPOFOL N/A 02/08/2019   Procedure: ESOPHAGOGASTRODUODENOSCOPY (EGD) WITH PROPOFOL;  Surgeon: Rachael Fee, MD;  Location: [facility] ENDOSCOPY;  Service: Gastroenterology;  Laterality: N/A;   ESOPHAGOGASTRODUODENOSCOPY (EGD) WITH PROPOFOL N/A 12/22/2019   Procedure: ESOPHAGOGASTRODUODENOSCOPY (EGD) WITH PROPOFOL;  Surgeon: Shellia Cleverly, DO;  Location[facility] ENDOSCOPY;  Service: Gastroenterology;  Laterality: N/A;   ESOPHAGOGASTRODUODENOSCOPY (EGD) WITH PROPOFOL N/A 10/19/2020   Procedure: ESOPHAGOGASTRODUODENOSCOPY (EGD) WITH PROPOFOL;  Surgeon: Lynann Bologna, MD;  Location: [facility] ENDOSCOPY;  Service: Endoscopy;  Laterality: N/A;   ESOPHAGOGASTRODUODENOSCOPY (EGD) WITH PROPOFOL N/A 12/22/2020   Procedure: ESOPHAGOGASTRODUODENOSCOPY (EGD) WITH PROPOFOL;  Surgeon: Iva Boop, MD;  Location: [facility] ENDOSCOPY;  Service: Endoscopy;   Laterality: N/A;   ESOPHAGOGASTRODUODENOSCOPY (EGD) WITH PROPOFOL N/A 01/09/2021   Procedure: ESOPHAGOGASTRODUODENOSCOPY (EGD) WITH PROPOFOL;  Surgeon: Hilarie Fredrickson, MD;  Location: [facility] ENDOSCOPY;  Service: Endoscopy;  Laterality: N/A;   HEMOSTASIS CLIP PLACEMENT  12/22/2019   Procedure: HEMOSTASIS CLIP PLACEMENT;  Surgeon: Shellia Cleverly, DO;  Location: [facility] ENDOSCOPY;  Service: Gastroenterology;;   HEMOSTASIS CLIP PLACEMENT  12/22/2020   Procedure: HEMOSTASIS CLIP PLACEMENT;  Surgeon: Iva Boop, MD;  Location: [facility] ENDOSCOPY;  Service: Endoscopy;;   HEMOSTASIS CONTROL  12/22/2020   Procedure: HEMOSTASIS CONTROL/hemospray;  Surgeon: Iva Boop, MD;  Location: [facility] ENDOSCOPY;  Service: Endoscopy;;   HOT HEMOSTASIS N/A 02/08/2019   Procedure: HOT HEMOSTASIS (ARGON PLASMA COAGULATION/BICAP);  Surgeon: Rachael Fee, MD;  Location: [facility] ENDOSCOPY;  Service: Gastroenterology;  Laterality: N/A;  HOT HEMOSTASIS N/A 12/22/2019   Procedure: HOT HEMOSTASIS (ARGON PLASMA COAGULATION/BICAP);  Surgeon: Shellia Cleverly, DO;  Location: [facility] ENDOSCOPY;  Service: Gastroenterology;  Laterality: N/A;   HOT HEMOSTASIS N/A 10/19/2020   Procedure: HOT HEMOSTASIS (ARGON PLASMA COAGULATION/BICAP);  Surgeon: Lynann Bologna, MD;  Location: [facility] ENDOSCOPY;  Service: Endoscopy;  Laterality: N/A;   HOT HEMOSTASIS N/A 12/22/2020   Procedure: HOT HEMOSTASIS (ARGON PLASMA COAGULATION/BICAP);  Surgeon: Iva Boop, MD;  Location: [facility] ENDOSCOPY;  Service: Endoscopy;  Laterality: N/A;   HOT HEMOSTASIS N/A 01/09/2021   Procedure: HOT HEMOSTASIS (ARGON PLASMA COAGULATION/BICAP);  Surgeon: Hilarie Fredrickson, MD;  Location: [facility] ENDOSCOPY;  Service: Endoscopy;  Laterality: N/A;   HOT HEMOSTASIS N/A 01/25/2021   Procedure: HOT HEMOSTASIS (ARGON PLASMA COAGULATION/BICAP);  Surgeon: Sherrilyn Rist, MD;  Location: [facility] ENDOSCOPY;  Service: Gastroenterology;  Laterality: N/A;   HOT HEMOSTASIS N/A 02/13/2021   Procedure: HOT HEMOSTASIS (ARGON  PLASMA COAGULATION/BICAP);  Surgeon: Lynann Bologna, MD;  Location: [facility] ENDOSCOPY;  Service: Endoscopy;  Laterality: N/A;   HOT HEMOSTASIS N/A 05/07/2021   Procedure: HOT HEMOSTASIS (ARGON PLASMA COAGULATION/BICAP);  Surgeon: Benancio Deeds, MD;  Location: [place] ENDOSCOPY;  Service: Gastroenterology;  Laterality: N/A;   INSERTION OF DIALYSIS CATHETER N/A 02/08/2022   Procedure: INSERTION OF TUNNELED DIALYSIS CATHETER;  Surgeon: Chuck Hint, MD;  Location: [facility] OR;  Service: Vascular;  Laterality: N/A;   INTERTROCHANTERIC HIP FRACTURE SURGERY Left 11/2006   Hattie Perch 03/08/2011   IR FLUORO GUIDE CV LINE LEFT  03/14/2022   IR FLUORO GUIDE CV LINE RIGHT  07/24/2019   IR FLUORO GUIDE CV LINE RIGHT  07/30/2019   IR US GUIDE VASC ACCESS RIGHT  07/24/2019   IR US GUIDE VASC ACCESS RIGHT  07/30/2019   LAPAROSCOPIC CHOLECYSTECTOMY  05/2006   LIGATION OF ARTERIOVENOUS  FISTULA Left 02/08/2022   Procedure: LIGATION OF LEFT ARTERIOVENOUS  FISTULA;  Surgeon: Chuck Hint, MD;  Location: [facility] OR;  Service: Vascular;  Laterality: Left;   LIGATION OF COMPETING BRANCHES OF ARTERIOVENOUS FISTULA Left 11/05/2018   Procedure: LIGATION OF COMPETING BRANCHES OF ARTERIOVENOUS FISTULA  LEFT  ARM;  Surgeon: Cephus Shelling, MD;  Location[facility] OR;  Service: Vascular;  Laterality: Left;   LUMBAR LAMINECTOMY/DECOMPRESSION MICRODISCECTOMY N/A 02/29/2016   Procedure: Left L4-5 Lateral Recess Decompression, Removal Extradural Intraspinal Facet Cyst;  Surgeon: Eldred Manges, MD;  Location: [facility] OR;  Service: Orthopedics;  Laterality: N/A;   METATARSAL HEAD EXCISION Right 07/20/2022   Procedure: Irragation and debridement of right foot wound with extraction of all necrotic soft tissue and bone;  Surgeon: Pilar Plate, DPM;  Location: [facility] OR;  Service: Podiatry;  Laterality: Right;  Right 4th Met head and base of proximal phalanx resection   MULTIPLE TOOTH EXTRACTIONS     ORIF MANDIBULAR FRACTURE Left 08/13/2004    ORIF of left body fracture mandible with KLS Martin 2.3-mm six hole/notes 03/08/2011   RIGHT/LEFT HEART CATH AND CORONARY/GRAFT ANGIOGRAPHY N/A 12/19/2019   Procedure: RIGHT/LEFT HEART CATH AND CORONARY/GRAFT ANGIOGRAPHY;  Surgeon: Corky Crafts, MD;  Location: [facility] INVASIVE CV LAB;  Service: Cardiovascular;  Laterality: N/A;   SCLEROTHERAPY  12/22/2019   Procedure: SCLEROTHERAPY;  Surgeon: Shellia Cleverly, DO;  Location: [facility] ENDOSCOPY;  Service: Gastroenterology;;   Susa Day  02/13/2021   Procedure: Susa Day;  Surgeon: Lynann Bologna, MD;  Location: [facility] ENDOSCOPY;  Service: Endoscopy;;       Home Medications    Prior to Admission medications   Medication  Sig Start Date End Date Taking? Authorizing Provider  acetaminophen (TYLENOL) 650 MG CR tablet Take 1,300 mg by mouth 3 (three) times daily.   Yes [provider]  albuterol (PROVENTIL) (2.5 MG/3ML) 0.083% nebulizer solution Take 3 mLs (2.5 mg total) by nebulization every 6 (six) hours as needed for wheezing or shortness of breath. 06/20/23 06/19/24 Yes [name], Dinah C, NP  allopurinol (ZYLOPRIM) 100 MG tablet TAKE 1 TABLET BY MOUTH DAILY 11/01/23  Yes [name], MD  atorvastatin (LIPITOR) 10 MG tablet TAKE 1 TABLET(10 MG) BY MOUTH DAILY. 11/23/23  Yes Gaston Islam., NP  B-D UF III MINI PEN NEEDLES 31G X 5 MM MISC USE 4 TIMES DAILY 07/13/23  Yes [name], MD  BIKTARVY 50-200-25 MG TABS tablet TAKE 1 TABLET BY MOUTH 1 TIME A DAY 11/13/23  Yes Comer, Belia Heman, MD  Calcium Acetate 667 MG TABS Take 667-2,001 mg by mouth See admin instructions. Take 2001 mg by mouth three times a day with meals and 667 mg with each snack 01/20/21  Yes [provider]  carvedilol (COREG) 3.125 MG tablet TAKE 1 TABLET(3.125 MG) BY MOUTH TWICE DAILY WITH A MEAL 11/28/23  Yes Gaston Islam., NP  diclofenac Sodium (VOLTAREN) 1 % GEL Apply 2 grams topically to the affected area three times daily as needed for pain.  12/19/22  Yes [name], MD  doxycycline (VIBRA-TABS) 100 MG tablet Take 1 tablet (100 mg total) by mouth 2 (two) times daily for 7 days. 12/24/23 12/31/23 Yes [name], Dinah C, NP  folic acid (FOLVITE) 1 MG tablet TAKE 1 TABLET(1 MG) BY MOUTH DAILY 11/27/23  Yes Veludandi, Prashanthi, MD  gabapentin (NEURONTIN) 300 MG capsule TAKE 1 CAPSULE BY MOUTH DAILY. 05/11/22  Yes [name], MD  gentamicin ointment (GARAMYCIN) 0.1 % Apply 1 Application topically daily. 01/06/23  Yes Standiford, Jenelle Mages, DPM  glucose blood (ONETOUCH ULTRA) test strip Check blood sugar three times daily E11.40 05/18/23  Yes [name], MD  HUMALOG KWIKPEN 100 UNIT/ML KwikPen Inject 5-8 Units into the skin 3 (three) times daily. 12/26/23  Yes [name], MD  insulin degludec (TRESIBA) 100 UNIT/ML FlexTouch Pen Inject 10 Units into the skin daily. 3 12/26/23  Yes [name], MD  isosorbide mononitrate (IMDUR) 30 MG 24 hr tablet Take 1 tablet (30 mg total) by mouth daily. 06/05/23  Yes [name], MD  Lancets [facility] DELICA PLUS [place]) MISC USE 3 TIMES DAILY 08/15/22  Yes [name], MD  latanoprost (XALATAN) 0.005 % ophthalmic solution Place 1 drop into both eyes at bedtime.   Yes [provider]  lidocaine-prilocaine (EMLA) cream Apply topically as directed. At dialysis 10/30/23  Yes [provider]  Menthol-Camphor (TIGER BALM ARTHRITIS RUB) 11-11 % CREA Apply 1 Application topically as needed (pain).   Yes [provider]  Methoxy PEG-Epoetin Beta (MIRCERA IJ) as directed. At Dialysis 11/08/23 11/06/24 Yes [provider]  midodrine (PROAMATINE) 10 MG tablet Take 10 mg by mouth 3 (three) times a week. 03/21/23  Yes [provider]  multivitamin (RENA-VIT) TABS tablet Take 1 tablet by mouth daily.   Yes [provider]  neomycin-bacitracin-polymyxin [phone] OINT Apply 1 Application topically in the morning and at bedtime.  12/26/23  Yes [name], MD  octreotide (SANDOSTATIN LAR DEPOT) 10 MG injection Inject 10 mg into the muscle every 28 days. 08/22/23  Yes Hilarie Fredrickson, MD  oxyCODONE-acetaminophen (PERCOCET) 5-325 MG tablet Take 1  tablet by mouth every 8 (eight) hours as needed for severe pain (pain score 7-10). 12/05/23  Yes Veludandi, Elvis Coil, MD  PREVIDENT 5000 PLUS 1.1 % CREA dental cream Place 1 Application onto teeth at bedtime. 05/25/23  Yes [provider]  timolol (TIMOPTIC) 0.5 % ophthalmic solution Place 1 drop into both eyes in the morning. 04/04/19  Yes [provider]  umeclidinium-vilanterol (ANORO ELLIPTA) 62.5-25 MCG/ACT AEPB Inhale 1 puff into the lungs daily. 06/07/23  Yes [name], MD    Family History Family History  Problem Relation Age of Onset   Heart failure Father    Hypertension Father    Diabetes Brother    Heart attack Brother    Alzheimer's disease Mother    Stroke Sister    Diabetes Sister    Alzheimer's disease Sister    Hypertension Brother    Diabetes Brother    Drug abuse Brother    Colon cancer Neg Hx     Social History Social History   Tobacco Use   Smoking status: Every Day    Current packs/day: 0.50    Average packs/day: 0.5 packs/day for 43.0 years (21.5 ttl pk-yrs)    Types: Cigarettes    Passive exposure: Never   Smokeless tobacco: Never  Vaping Use   Vaping status: Never Used  Substance Use Topics   Alcohol use: Not Currently    Alcohol/week: 12.0 standard drinks of alcohol    Types: 12 Standard drinks or equivalent per week    Comment: occassional   Drug use: Not Currently    Types: Cocaine    Comment: hx of crack/cocaine 39yrs ago 10/01/2019- none     Allergies   Augmentin [amoxicillin-pot clavulanate], Morphine sulfate, Mucinex fast-max, and Amphetamines   Review of Systems Review of Systems  Per HPI   Physical Exam Triage Vital Signs ED Triage Vitals  Encounter Vitals Group     BP 12/27/23  1133 106/65     Systolic BP Percentile --      Diastolic BP Percentile --      Pulse Rate 12/27/23 1133 76     Resp 12/27/23 1133 16     Temp 12/27/23 1133 (!) 97.5 F (36.4 C)     Temp Source 12/27/23 1133 Oral     SpO2 12/27/23 1133 100 %     Weight 12/27/23 1131 180 lb (81.6 kg)     Height 12/27/23 1131 6\' 2"  (1.88 m)     Head Circumference --      Peak Flow --      Pain Score 12/27/23 1130 0     Pain Loc --      Pain Education --      Exclude from Growth Chart --    No data found.  Updated Vital Signs BP 106/65   Pulse 76   Temp (!) 97.5 F (36.4 C) (Oral)   Resp 16   Ht 6\' 2"  (1.88 m)   Wt 180 lb (81.6 kg)   SpO2 100%   BMI 23.11 kg/m   Visual Acuity Right Eye Distance:   Left Eye Distance:   Bilateral Distance:    Right Eye Near:   Left Eye Near:    Bilateral Near:     Physical Exam Vitals and nursing note reviewed.  Constitutional:      Appearance: Normal appearance.  HENT:     Head: Normocephalic and atraumatic.     Right Ear: External ear normal.     Left  Ear: External ear normal.     Nose: Nose normal.     Mouth/Throat:     Mouth: Mucous membranes are moist.  Eyes:     Conjunctiva/sclera: Conjunctivae normal.  Cardiovascular:     Rate and Rhythm: Normal rate and regular rhythm.     Heart sounds: Normal heart sounds. No murmur heard. Pulmonary:     Effort: Pulmonary effort is normal. No respiratory distress.     Breath sounds: Normal breath sounds.  Abdominal:     General: There is distension.     Tenderness: There is no abdominal tenderness. There is no guarding or rebound.  Musculoskeletal:     Cervical back: Normal range of motion.     Right lower leg: Edema present.     Left lower leg: Edema present.  Skin:    General: Skin is warm and dry.  Neurological:     Mental Status: He is alert and oriented to person, place, and time.  Psychiatric:        Mood and Affect: Mood normal.      UC Treatments / Results  Labs (all labs  ordered are listed, but only abnormal results are displayed) Labs Reviewed - No data to display  EKG   Radiology No results found.  Procedures Procedures (including critical care time)  Medications Ordered in UC Medications - No data to display  Initial Impression / Assessment and Plan / UC Course  I have reviewed the triage vital signs and the nursing notes.  Pertinent labs & imaging results that were available during my care of the patient were reviewed by me and considered in my medical decision making (see chart for details).  Vitals and triage reviewed, patient is hemodynamically stable.  Abdomen appears distended, active bowel sounds and without tenderness.  Bilateral lower extremity edema, endorses worsening shortness of breath, concern for CHF exacerbation versus other acute etiology.  Patient does have multiple comorbid conditions that make him high risk for infection versus other acute etiologies.  Discussed urgent care is unable to rule out urinary tract infection as we do not have the ability to perform In-N-Out cath.   Advised further evaluation at the nearest emergency department due to altered mental status, abdominal pain, swelling and shortness of breath.  Caregiver verbalized understanding, will transport patient via POV.     Final Clinical Impressions(s) / UC Diagnoses   Final diagnoses:  Altered mental status, unspecified altered mental status type  Leg swelling  Abdominal pain, unspecified abdominal location   Discharge Instructions   None    ED Prescriptions   None    PDMP not reviewed this encounter.   Mckinzie Saksa, [place] N, [place] 12/27/23 [phone]

## 2023-12-27 NOTE — ED Notes (Signed)
 Pt ambulated approx 30-40 ft across nurse's station with standby assist. Pt was mildly SOB when back in room. Hooked to monitor and pt was 99% on room air.

## 2023-12-27 NOTE — ED Triage Notes (Signed)
 Caregiver stated that nurse at dialysis mention the patient is confused today.

## 2023-12-27 NOTE — ED Notes (Addendum)
 Patient is being discharged from the Urgent Care and sent to the Emergency Department via POV . Per [place] Garrison, FNP, patient is in need of higher level of care due to altered mental status. Patient is aware and verbalizes understanding of plan of care.  Vitals:   12/27/23 1133  BP: 106/65  Pulse: 76  Resp: 16  Temp: (!) 97.5 F (36.4 C)  SpO2: 100%

## 2023-12-27 NOTE — ED Provider Notes (Signed)
  Provider Note MRN:  [id number]  Arrival date & time: 12/27/23    ED Course and Medical Decision Making   See note from prior team for complete details, in brief:   Clinical Course as of 12/27/23 2012  Wed Dec 27, 2023  1629 Handoff MB 73 yo male from HD AMS Workup so far looks okay Pending CTAP If feeling better can go home, he is Aox3 per prior team [SG]  1958 On recheck feeling much better, he is eager for discharge, reports the he feels totally normal and does not want to stay in the hospital. He has home health at home 1x weekly, may need to increase. Advised to f/u with pcp. Relative will stay at house tonight  [SG]  2008 Creatinine(!): 6.56 Received HD today [SG]  2008 O2 Device: Nasal Cannula He is now on RA, no dyspnea [SG]    Clinical Course User Index [SG] Tanda Rockers A, DO   On recheck he is feeling much better, he was able to ambulate without assistance without hypoxia.  Uses a walker at home  Neuro non-focal  Reports that he feels "fine" and would like to go home.  Encouraged him to follow-up with his PCP, encourage appropriate use of opiate medications   The patient improved significantly and was discharged in stable condition. Detailed discussions were had with the patient/guardian regarding current findings, and need for close f/u with PCP or on call doctor. The patient/guardian has been instructed to return immediately if the symptoms worsen in any way for re-evaluation. Patient/guardian verbalized understanding and is in agreement with current care plan. All questions answered prior to discharge.    Procedures  Final Clinical Impressions(s) / ED Diagnoses     ICD-10-CM   1. Other fatigue  R53.83       ED Discharge Orders     None       Discharge Instructions   None        Sloan Leiter, DO 12/27/23 2012

## 2023-12-27 NOTE — Discharge Instructions (Addendum)
 Please follow up with your pcp in the next few days for recheck  Please take your medications as prescribed  It was a pleasure caring for you today in the emergency department.  Please return to the emergency department for any worsening or worrisome symptoms.

## 2023-12-27 NOTE — ED Notes (Signed)
 Pt falling asleep in room, sats dropping to 88% on room air. Pts denies sleep apnea, placed pt on 2L [place].

## 2023-12-27 NOTE — ED Notes (Signed)
 Pt c/o L Leg cramps upon return from CT. Pt states that mustard helps when this happens at home. Pt provided with 3 mustard packs at this time.

## 2023-12-28 ENCOUNTER — Telehealth: Payer: Self-pay

## 2023-12-28 ENCOUNTER — Telehealth: Payer: Self-pay | Admitting: *Deleted

## 2023-12-28 NOTE — Telephone Encounter (Signed)
 Pt called to review results of labs.

## 2023-12-28 NOTE — Telephone Encounter (Signed)
 Copied from CRM [id number]. Topic: General - Other >> Dec 28, 2023  8:09 AM Suzette B wrote: Reason for CRM: Patient called in stating that he keeps being told his provider needs to speak with him. He states he doesn't know what but would like someone to call him on his home phone >> Dec 28, 2023 11:19 AM Dennison Nancy wrote: Patient calling back and stated that someone wanted to talk with him

## 2023-12-29 DIAGNOSIS — D631 Anemia in chronic kidney disease: Secondary | ICD-10-CM | POA: Diagnosis not present

## 2023-12-29 DIAGNOSIS — D688 Other specified coagulation defects: Secondary | ICD-10-CM | POA: Diagnosis not present

## 2023-12-29 DIAGNOSIS — Z992 Dependence on renal dialysis: Secondary | ICD-10-CM | POA: Diagnosis not present

## 2023-12-29 DIAGNOSIS — N2581 Secondary hyperparathyroidism of renal origin: Secondary | ICD-10-CM | POA: Diagnosis not present

## 2023-12-29 DIAGNOSIS — N186 End stage renal disease: Secondary | ICD-10-CM | POA: Diagnosis not present

## 2023-12-29 DIAGNOSIS — R197 Diarrhea, unspecified: Secondary | ICD-10-CM | POA: Diagnosis not present

## 2024-01-01 DIAGNOSIS — N186 End stage renal disease: Secondary | ICD-10-CM | POA: Diagnosis not present

## 2024-01-01 DIAGNOSIS — N2581 Secondary hyperparathyroidism of renal origin: Secondary | ICD-10-CM | POA: Diagnosis not present

## 2024-01-01 DIAGNOSIS — D688 Other specified coagulation defects: Secondary | ICD-10-CM | POA: Diagnosis not present

## 2024-01-01 DIAGNOSIS — Z992 Dependence on renal dialysis: Secondary | ICD-10-CM | POA: Diagnosis not present

## 2024-01-01 DIAGNOSIS — D631 Anemia in chronic kidney disease: Secondary | ICD-10-CM | POA: Diagnosis not present

## 2024-01-01 DIAGNOSIS — R197 Diarrhea, unspecified: Secondary | ICD-10-CM | POA: Diagnosis not present

## 2024-01-01 LAB — DRUG TOX MONITOR 1 W/CONF, ORAL FLD
Amphetamines: NEGATIVE ng/mL (ref <10)
Barbiturates: NEGATIVE ng/mL (ref <10)
Benzodiazepines: NEGATIVE ng/mL (ref <0.50)
Buprenorphine: NEGATIVE ng/mL (ref <0.10)
Cocaine: NEGATIVE ng/mL (ref <5.0)
Codeine: NEGATIVE ng/mL (ref <2.5)
Cotinine: 110.7 ng/mL — ABNORMAL HIGH (ref <5.0)
Dihydrocodeine: NEGATIVE ng/mL (ref <2.5)
Fentanyl: NEGATIVE ng/mL (ref <0.10)
Heroin Metabolite: NEGATIVE ng/mL (ref <1.0)
Hydrocodone: NEGATIVE ng/mL (ref <2.5)
Hydromorphone: NEGATIVE ng/mL (ref <2.5)
MARIJUANA: NEGATIVE ng/mL (ref <2.5)
MDMA: NEGATIVE ng/mL (ref <10)
Meprobamate: NEGATIVE ng/mL (ref <2.5)
Methadone: NEGATIVE ng/mL (ref <5.0)
Morphine: NEGATIVE ng/mL (ref <2.5)
Nicotine Metabolite: POSITIVE ng/mL — AB (ref <5.0)
Norhydrocodone: NEGATIVE ng/mL (ref <2.5)
Noroxycodone: 18.8 ng/mL — ABNORMAL HIGH (ref <2.5)
Opiates: POSITIVE ng/mL — AB (ref <2.5)
Oxycodone: >250 ng/mL — ABNORMAL HIGH (ref <2.5)
Oxymorphone: 8.2 ng/mL — ABNORMAL HIGH (ref <2.5)
Phencyclidine: NEGATIVE ng/mL (ref <10)
Tapentadol: NEGATIVE ng/mL (ref <5.0)
Tramadol: NEGATIVE ng/mL (ref <5.0)
Zolpidem: NEGATIVE ng/mL (ref <5.0)

## 2024-01-02 ENCOUNTER — Encounter: Payer: Self-pay | Admitting: Sports Medicine

## 2024-01-02 ENCOUNTER — Ambulatory Visit (INDEPENDENT_AMBULATORY_CARE_PROVIDER_SITE_OTHER): Admitting: Sports Medicine

## 2024-01-02 ENCOUNTER — Telehealth: Payer: Self-pay | Admitting: *Deleted

## 2024-01-02 VITALS — BP 116/62 | HR 75 | Temp 96.7°F | Resp 16 | Ht 74.0 in | Wt 197.8 lb

## 2024-01-02 DIAGNOSIS — L97321 Non-pressure chronic ulcer of left ankle limited to breakdown of skin: Secondary | ICD-10-CM | POA: Diagnosis not present

## 2024-01-02 DIAGNOSIS — Z992 Dependence on renal dialysis: Secondary | ICD-10-CM

## 2024-01-02 DIAGNOSIS — E1142 Type 2 diabetes mellitus with diabetic polyneuropathy: Secondary | ICD-10-CM

## 2024-01-02 DIAGNOSIS — E1122 Type 2 diabetes mellitus with diabetic chronic kidney disease: Secondary | ICD-10-CM | POA: Diagnosis not present

## 2024-01-02 DIAGNOSIS — I10 Essential (primary) hypertension: Secondary | ICD-10-CM | POA: Diagnosis not present

## 2024-01-02 DIAGNOSIS — K5900 Constipation, unspecified: Secondary | ICD-10-CM | POA: Diagnosis not present

## 2024-01-02 DIAGNOSIS — N186 End stage renal disease: Secondary | ICD-10-CM

## 2024-01-02 DIAGNOSIS — I5042 Chronic combined systolic (congestive) and diastolic (congestive) heart failure: Secondary | ICD-10-CM | POA: Diagnosis not present

## 2024-01-02 DIAGNOSIS — L97311 Non-pressure chronic ulcer of right ankle limited to breakdown of skin: Secondary | ICD-10-CM | POA: Diagnosis not present

## 2024-01-02 DIAGNOSIS — Z794 Long term (current) use of insulin: Secondary | ICD-10-CM | POA: Diagnosis not present

## 2024-01-02 DIAGNOSIS — I132 Hypertensive heart and chronic kidney disease with heart failure and with stage 5 chronic kidney disease, or end stage renal disease: Secondary | ICD-10-CM | POA: Diagnosis not present

## 2024-01-02 DIAGNOSIS — I87313 Chronic venous hypertension (idiopathic) with ulcer of bilateral lower extremity: Secondary | ICD-10-CM | POA: Diagnosis not present

## 2024-01-02 MED ORDER — INSULIN DEGLUDEC 100 UNIT/ML ~~LOC~~ SOPN: 15.0000 [IU] | PEN_INJECTOR | Freq: Every day | SUBCUTANEOUS | Status: DC

## 2024-01-02 MED ORDER — HUMALOG KWIKPEN 100 UNIT/ML ~~LOC~~ SOPN: 15.0000 [IU] | PEN_INJECTOR | Freq: Three times a day (TID) | SUBCUTANEOUS | Status: DC

## 2024-01-02 NOTE — Patient Instructions (Addendum)
 Take stool softeners colace 100mg  twice daily   Tresiba - take 15 units at bed time  Decrease novolog to 15 units with each meal Avoid taking novolog if BG are less than 100  If BG are less than 100 take only the half the amount of tresiba

## 2024-01-02 NOTE — Progress Notes (Unsigned)
 Complex Care Management Care Guide Note  01/02/2024 Name: SHADRACK BRUMMITT MRN: [id number] DOB: 28-Feb-1951  RUVEN CORRADI is a 73 y.o. year old male who is a primary care patient of [name], MD and is actively engaged with the care management team. I reached out to David Stall by phone today to assist with scheduling  with the RN Case Manager.  Follow up plan: A telephone outreach attempt made.  Gwenevere Ghazi  [facility]  [facility], [facility] Guide  Direct Dial: [phone]  Fax [phone]

## 2024-01-02 NOTE — Progress Notes (Signed)
 Careteam: Patient Care Team: [name], MD as PCP - General (Internal Medicine) [name], MD as PCP - Cardiology (Cardiology) [name], MD as Consulting Physician (Ophthalmology) [name], MD as Consulting Physician (Cardiology) [name], MD (Inactive) as Consulting Physician (Infectious Diseases) [name], DPM as Consulting Physician (Podiatry) [name], [name], NP as Nurse Practitioner (Family Medicine) [place], [facility], [facility], Karma Lew, RN as VBCI Care Management  PLACE OF SERVICE:  [facility]  Advanced Directive information Does Patient Have a Medical Advance Directive?: Yes, Type of Advance Directive: Healthcare Power of [place];Living will, Does patient want to make changes to medical advance directive?: No - Patient declined  Allergies  Allergen Reactions   Augmentin [Amoxicillin-Pot Clavulanate] Diarrhea and Other (See Comments)    Severe diarrhea   Morphine Sulfate Anaphylaxis    Can't mix with oxycodone    Mucinex Fast-Max Other (See Comments)    Intense sweating    Amphetamines Other (See Comments)    Unknown reaction    Chief Complaint  Patient presents with   Transitions Of Care    [facility] 12-27-2023     Discussed the use of AI scribe software for clinical note transcription with the patient, who gave verbal consent to proceed.  History of Present Illness   The patient, with chronic kidney disease on dialysis, DM presents to clinic for ED follow up pt went to ED for  confusion and SOB.  He had imaging done in the ED  Ct HEAD No acute CT finding. Generalized brain atrophy. Atherosclerotic calcification of the major vessels at the base of the brain.  Ct ABD/ PELVIS  IMPRESSION: No acute findings. Colonic diverticulosis, without radiographic evidence of diverticulitis.Stable small left inguinal hernia, which contains only fat.  X ray chest  IMPRESSION: Stable  cardiomegaly and chronic appearing increased lung markings with mild to moderate severity left basilar atelectasis and/or infiltrate.  Pt was found sleepy during the interview  He c/o  exertional SOB but nothing changed since he left the ED Denies chest pain, palpitations. He experiences breathing difficulties that are 'very strange at times' and limit his walking distance. He can walk to the parking lot but not much further. He sometimes falls asleep unexpectedly, which he attributes to pain medication taken the previous day.  He is on dialysis and had a complete dialysis session yesterday.    He has a history of diabetes, managed with insulin, including 20 mg of Humalog with each meal and 10 units of Lantus at night. His blood sugar levels have been fluctuating, with recent readings around 200 in the morning and sometimes dropping to 95.  Constipation- reports that he strains with bowel movements and occasionally sees blood in the stool. Reports abdominal distention but no pain.         Review of Systems:  Review of Systems  Constitutional:  Negative for chills and fever.  HENT:  Negative for congestion and sore throat.   Eyes:  Negative for double vision.  Respiratory:  Positive for shortness of breath (exertional). Negative for cough and sputum production.   Cardiovascular:  Negative for chest pain, palpitations and leg swelling.  Gastrointestinal:  Negative for abdominal pain, heartburn and nausea.  Genitourinary:  Negative for dysuria, frequency and hematuria.  Musculoskeletal:  Positive for joint pain. Negative for myalgias.  Neurological:  Negative for dizziness.   Negative unless indicated in HPI.   Past Medical History:  Diagnosis Date   Acute respiratory  failure (HCC) 03/01/2018   Anemia    Arthritis    "all over; mostly knees and back" (02/28/2018)   Chronic combined systolic and diastolic CHF (congestive heart failure) (HCC)    Chronic lower back pain    stenosis    Community acquired pneumonia 09/06/2013   COPD (chronic obstructive pulmonary disease) (HCC)    Coronary atherosclerosis of native coronary artery    a. 02/2003 s/p CABG x 2 (VG->RI, VG->RPDA; b. 11/2019 PCI: LM nl, LAD 90d, D3 50, RI 100, LCX 100p, OM3 100 - fills via L->L collats from D2/dLAD, RCA 100p, VG->RPDA ok, VG->RI 95 (3.5x48 Synergy XD DES).   Drug abuse (HCC)    hx; tested for cocaine as recently as 2/08. says he is not using drugs now - avoided defib. for this reason    ESRD (end stage renal disease) (HCC)    Hemo M-W-F- [facility]   Fall at home 10/2020   GERD (gastroesophageal reflux disease)    takes OTC meds as needed   GI bleeding    a. 11/2019 EGD: angiodysplastic lesions w/ bleeding s/p argon plasma/clipping/epi inj. Multiple admissions for the same.   Glaucoma    uses eye drops daily   Hepatitis B 1968   "tx'd w/isolation; caught it from toilet stools in gym"   History of blood transfusion 03/01/2019   History of colon polyps    benign   History of gout    takes Allopurinol daily as well as Colchicine-if needed (02/28/2018)   History of kidney stones    HTN (hypertension)    takes Coreg,Imdur.and Apresoline daily   Human immunodeficiency virus (HIV) disease (HCC) dx'd 1995   on Biktarvy as of 12/2020.     Hyperlipidemia    Ischemic cardiomyopathy    a. 01/2019 Echo: EF 40-45%, diffuse HK, mild basal septal hypertrophy. Diast dysfxn. Nl RV size/fxn. Sev dil LA. Triv MR/TR/PR.   Muscle spasm    takes Zanaflex as needed   Myocardial infarction (HCC) ~ 2004/2005   Nocturia    Peripheral neuropathy    takes gabapentin daily   Pneumonia    "at least twice" (02/28/2018)   SDH (subdural hematoma) (HCC)    Syphilis, unspecified    Type II diabetes mellitus (HCC) 2004   Lantus daily.Average fasting blood sugar 125-199   Wears glasses    Wears partial dentures    Past Surgical History:  Procedure Laterality Date   A/V FISTULAGRAM N/A 12/07/2023   Procedure: A/V  Fistulagram;  Surgeon: Tyler Pita, MD;  [facility] INVASIVE CV LAB;  Service: Cardiovascular;  Laterality: N/A;   AV FISTULA PLACEMENT Left 08/02/2018   Procedure: ARTERIOVENOUS (AV) FISTULA CREATION  left arm radiocephlic;  Surgeon: Cephus Shelling, MD;  Location: [facility] OR;  Service: Vascular;  Laterality: Left;   AV FISTULA PLACEMENT Left 08/01/2019   Procedure: LEFT BRACHIOCEPHALIC ARTERIOVENOUS (AV) FISTULA CREATION;  Surgeon: Larina Earthly, MD;  Location: [facility] OR;  Service: Vascular;  Laterality: Left;   AV FISTULA PLACEMENT Right 04/12/2022   Procedure: RIGHT UPPER EXTREMITY ARTERIOVENOUS (AV)  GRAFT INSERTION USING GORE STRETCH 4-7 MM;  Surgeon: Chuck Hint, MD;  Location: [facility] OR;  Service: Vascular;  Laterality: Right;   BASCILIC VEIN TRANSPOSITION Left 10/03/2019   Procedure: BASILIC VEIN TRANSPOSITION LEFT SECOND STAGE;  Surgeon: Larina Earthly, MD;  Location: [facility] OR;  Service: Vascular;  Laterality: Left;   BIOPSY  01/25/2021   Procedure: BIOPSY;  Surgeon: Sherrilyn Rist, MD;  [facility]: [facility] ENDOSCOPY;  Service: Gastroenterology;;   CARDIAC CATHETERIZATION  10/2002; 12/19/2004   Hattie Perch 03/08/2011   COLONOSCOPY  2013   Dr.John Marina Goodell    CORONARY ARTERY BYPASS GRAFT  02/24/2003   CABG X2/notes 03/08/2011   CORONARY STENT INTERVENTION N/A 12/19/2019   Procedure: CORONARY STENT INTERVENTION;  Surgeon: Corky Crafts, MD;  [facility];  Service: Cardiovascular;  Laterality: N/A;   CORONARY ULTRASOUND/IVUS N/A 12/19/2019   Procedure: Intravascular Ultrasound/IVUS;  Surgeon: Corky Crafts, MD;  Location: [facility] INVASIVE CV LAB;  Service: Cardiovascular;  Laterality: N/A;   ENTEROSCOPY N/A 01/25/2021   Procedure: ENTEROSCOPY;  Surgeon: Sherrilyn Rist, MD;  Location: [facility] ENDOSCOPY;  Service: Gastroenterology;  Laterality: N/A;   ENTEROSCOPY N/A 02/13/2021   Procedure: ENTEROSCOPY;  Surgeon: Lynann Bologna, MD;  Location: [facility] ENDOSCOPY;  Service: Endoscopy;   Laterality: N/A;   ENTEROSCOPY N/A 05/07/2021   Procedure: ENTEROSCOPY;  Surgeon: Benancio Deeds, MD;  Location: [facility] ENDOSCOPY;  Service: Gastroenterology;  Laterality: N/A;   ESOPHAGOGASTRODUODENOSCOPY (EGD) WITH PROPOFOL N/A 02/08/2019   Procedure: ESOPHAGOGASTRODUODENOSCOPY (EGD) WITH PROPOFOL;  Surgeon: Rachael Fee, MD;  Location: [facility] ENDOSCOPY;  Service: Gastroenterology;  Laterality: N/A;   ESOPHAGOGASTRODUODENOSCOPY (EGD) WITH PROPOFOL N/A 12/22/2019   Procedure: ESOPHAGOGASTRODUODENOSCOPY (EGD) WITH PROPOFOL;  Surgeon: Shellia Cleverly, DO;  [facility] ENDOS[facility];  Service: Gastroenterology;  Laterality: N/A;   ESOPHAGOGASTRODUODENOSCOPY (EGD) WITH PROPOFOL N/A 10/19/2020   Procedure: ESOPHAGOGASTRODUODENOSCOPY (EGD) WITH PROPOFOL;  Surgeon: Lynann Bologna, MD;  Location: [facility] ENDOSCOPY;  Service: Endoscopy;  Laterality: N/A;   ESOPHAGOGASTRODUODENOSCOPY (EGD) WITH PROPOFOL N/A 12/22/2020   Procedure: ESOPHAGOGASTRODUODENOSCOPY (EGD) WITH PROPOFOL;  Surgeon: Iva Boop, MD;  Location: [facility] ENDOSCOPY;  Service: Endoscopy;  Laterality: N/A;   ESOPHAGOGASTRODUODENOSCOPY (EGD) WITH PROPOFOL N/A 01/09/2021   Procedure: ESOPHAGOGASTRODUODENOSCOPY (EGD) WITH PROPOFOL;  Surgeon: Hilarie Fredrickson, MD;  Location: [facility] ENDOSCOPY;  Service: Endoscopy;  Laterality: N/A;   HEMOSTASIS CLIP PLACEMENT  12/22/2019   Procedure: HEMOSTASIS CLIP PLACEMENT;  Surgeon: Shellia Cleverly, DO;  [facility] ENDOS[facility];  Service: Gastroenterology;;   HEMOSTASIS CLIP PLACEMENT  12/22/2020   Procedure: HEMOSTASIS CLIP PLACEMENT;  Surgeon: Iva Boop, MD;  Location: [facility] ENDOSCOPY;  Service: Endoscopy;;   HEMOSTASIS CONTROL  12/22/2020   Procedure: HEMOSTASIS CONTROL/hemospray;  Surgeon: Iva Boop, MD;  Location: [facility] ENDOSCOPY;  Service: Endoscopy;;   HOT HEMOSTASIS N/A 02/08/2019   Procedure: HOT HEMOSTASIS (ARGON PLASMA COAGULATION/BICAP);  Surgeon: Rachael Fee, MD;  Location: [facility] ENDOSCOPY;  Service:  Gastroenterology;  Laterality: N/A;   HOT HEMOSTASIS N/A 12/22/2019   Procedure: HOT HEMOSTASIS (ARGON PLASMA COAGULATION/BICAP);  Surgeon: Shellia Cleverly, DO;  Location: [facility] ENDOSCOPY;  Service: Gastroenterology;  Laterality: N/A;   HOT HEMOSTASIS N/A 10/19/2020   Procedure: HOT HEMOSTASIS (ARGON PLASMA COAGULATION/BICAP);  Surgeon: Lynann Bologna, MD;  Location: [facility] ENDOSCOPY;  Service: Endoscopy;  Laterality: N/A;   HOT HEMOSTASIS N/A 12/22/2020   Procedure: HOT HEMOSTASIS (ARGON PLASMA COAGULATION/BICAP);  Surgeon: Iva Boop, MD;  Location: [facility] ENDOSCOPY;  Service: Endoscopy;  Laterality: N/A;   HOT HEMOSTASIS N/A 01/09/2021   Procedure: HOT HEMOSTASIS (ARGON PLASMA COAGULATION/BICAP);  Surgeon: Hilarie Fredrickson, MD;  Location: [facility] ENDOSCOPY;  Service: Endoscopy;  Laterality: N/A;   HOT HEMOSTASIS N/A 01/25/2021   Procedure: HOT HEMOSTASIS (ARGON PLASMA COAGULATION/BICAP);  Surgeon: Sherrilyn Rist, MD;  Location: [facility] ENDOSCOPY;  Service: Gastroenterology;  Laterality: N/A;   HOT HEMOSTASIS N/A 02/13/2021   Procedure: HOT HEMOSTASIS (ARGON PLASMA  COAGULATION/BICAP);  Surgeon: Lynann Bologna, MD;  Location: [facility] ENDOSCOPY;  Service: Endoscopy;  Laterality: N/A;   HOT HEMOSTASIS N/A 05/07/2021   Procedure: HOT HEMOSTASIS (ARGON PLASMA COAGULATION/BICAP);  Surgeon: Benancio Deeds, MD;  Location: [facility] ENDOSCOPY;  Service: Gastroenterology;  Laterality: N/A;   INSERTION OF DIALYSIS CATHETER N/A 02/08/2022   Procedure: INSERTION OF TUNNELED DIALYSIS CATHETER;  Surgeon: Chuck Hint, MD;  Location: [facility] OR;  Service: Vascular;  Laterality: N/A;   INTERTROCHANTERIC HIP FRACTURE SURGERY Left 11/2006   Hattie Perch 03/08/2011   IR FLUORO GUIDE CV LINE LEFT  03/14/2022   IR FLUORO GUIDE CV LINE RIGHT  07/24/2019   IR FLUORO GUIDE CV LINE RIGHT  07/30/2019   IR US GUIDE VASC ACCESS RIGHT  07/24/2019   IR US GUIDE VASC ACCESS RIGHT  07/30/2019   LAPAROSCOPIC CHOLECYSTECTOMY  05/2006   LIGATION OF  ARTERIOVENOUS  FISTULA Left 02/08/2022   Procedure: LIGATION OF LEFT ARTERIOVENOUS  FISTULA;  Surgeon: Chuck Hint, MD;  Location: [facility] OR;  Service: Vascular;  Laterality: Left;   LIGATION OF COMPETING BRANCHES OF ARTERIOVENOUS FISTULA Left 11/05/2018   Procedure: LIGATION OF COMPETING BRANCHES OF ARTERIOVENOUS FISTULA  LEFT  ARM;  Surgeon: Cephus Shelling, MD;  Location: [facility] OR;  Service: Vascular;  Laterality: Left;   LUMBAR LAMINECTOMY/DECOMPRESSION MICRODISCECTOMY N/A 02/29/2016   Procedure: Left L4-5 Lateral Recess Decompression, Removal Extradural Intraspinal Facet Cyst;  Surgeon: Eldred Manges, MD;  Location: [facility] OR;  Service: Orthopedics;  Laterality: N/A;   METATARSAL HEAD EXCISION Right 07/20/2022   Procedure: Irragation and debridement of right foot wound with extraction of all necrotic soft tissue and bone;  Surgeon: Pilar Plate, DPM;  Location: [facility] OR;  Service: Podiatry;  Laterality: Right;  Right 4th Met head and base of proximal phalanx resection   MULTIPLE TOOTH EXTRACTIONS     ORIF MANDIBULAR FRACTURE Left 08/13/2004   ORIF of left body fracture mandible with KLS Martin 2.3-mm six hole/notes 03/08/2011   RIGHT/LEFT HEART CATH AND CORONARY/GRAFT ANGIOGRAPHY N/A 12/19/2019   Procedure: RIGHT/LEFT HEART CATH AND CORONARY/GRAFT ANGIOGRAPHY;  Surgeon: Corky Crafts, MD;  Location: [facility] INVASIVE CV LAB;  Service: Cardiovascular;  Laterality: N/A;   SCLEROTHERAPY  12/22/2019   Procedure: SCLEROTHERAPY;  Surgeon: Shellia Cleverly, DO;  Location: [facility] ENDOSCOPY;  Service: Gastroenterology;;   Susa Day  02/13/2021   Procedure: Susa Day;  Surgeon: Lynann Bologna, MD;  Location: [facility] ENDOSCOPY;  Service: Endoscopy;;   Social History:   reports that he has been smoking cigarettes. He has a 21.5 pack-year smoking history. He has never been exposed to tobacco smoke. He has never used smokeless tobacco. He reports that he does not currently use alcohol after a past  usage of about 12.0 standard drinks of alcohol per week. He reports that he does not currently use drugs after having used the following drugs: Cocaine.  Family History  Problem Relation Age of Onset   Heart failure Father    Hypertension Father    Diabetes Brother    Heart attack Brother    Alzheimer's disease Mother    Stroke Sister    Diabetes Sister    Alzheimer's disease Sister    Hypertension Brother    Diabetes Brother    Drug abuse Brother    Colon cancer Neg Hx     Medications: Patient's Medications  New Prescriptions   No medications on file  Previous Medications   ACETAMINOPHEN (TYLENOL) 650 MG CR TABLET    Take 1,300  mg by mouth 3 (three) times daily.   ALBUTEROL (PROVENTIL) (2.5 MG/3ML) 0.083% NEBULIZER SOLUTION    Take 3 mLs (2.5 mg total) by nebulization every 6 (six) hours as needed for wheezing or shortness of breath.   ALLOPURINOL (ZYLOPRIM) 100 MG TABLET    TAKE 1 TABLET BY MOUTH DAILY   ATORVASTATIN (LIPITOR) 10 MG TABLET    TAKE 1 TABLET(10 MG) BY MOUTH DAILY.   B-D UF III MINI PEN NEEDLES 31G X 5 MM MISC    USE 4 TIMES DAILY   BIKTARVY 50-200-25 MG TABS TABLET    TAKE 1 TABLET BY MOUTH 1 TIME A DAY   CALCIUM ACETATE 667 MG TABS    Take 667-2,001 mg by mouth See admin instructions. Take 2001 mg by mouth three times a day with meals and 667 mg with each snack   CARVEDILOL (COREG) 3.125 MG TABLET    TAKE 1 TABLET(3.125 MG) BY MOUTH TWICE DAILY WITH A MEAL   DICLOFENAC SODIUM (VOLTAREN) 1 % GEL    Apply 2 grams topically to the affected area three times daily as needed for pain.   DOXYCYCLINE HYCLATE PO    Take 100 mg by mouth in the morning and at bedtime.   FOLIC ACID (FOLVITE) 1 MG TABLET    TAKE 1 TABLET(1 MG) BY MOUTH DAILY   GABAPENTIN (NEURONTIN) 300 MG CAPSULE    Take 300 mg by mouth in the morning and at bedtime.   GLUCOSE BLOOD (ONETOUCH ULTRA) TEST STRIP    Check blood sugar three times daily E11.40   ISOSORBIDE MONONITRATE (IMDUR) 30 MG 24 HR TABLET     Take 1 tablet (30 mg total) by mouth daily.   LANCETS (ONETOUCH DELICA PLUS LANCET33G) MISC    USE 3 TIMES DAILY   LATANOPROST (XALATAN) 0.005 % OPHTHALMIC SOLUTION    Place 1 drop into both eyes at bedtime.   LIDOCAINE-PRILOCAINE (EMLA) CREAM    Apply topically as directed. At dialysis   MENTHOL-CAMPHOR (TIGER BALM ARTHRITIS RUB) 11-11 % CREA    Apply 1 Application topically as needed (pain).   METHOXY PEG-EPOETIN BETA (MIRCERA IJ)    as directed. At Dialysis   MIDODRINE (PROAMATINE) 10 MG TABLET    Take 10 mg by mouth 3 (three) times a week.   MULTIVITAMIN (RENA-VIT) TABS TABLET    Take 1 tablet by mouth daily.   NEOMYCIN-BACITRACIN-POLYMYXIN [phone] OINT    Apply 1 Application topically in the morning and at bedtime.   OCTREOTIDE (SANDOSTATIN LAR DEPOT) 10 MG INJECTION    Inject 10 mg into the muscle every 28 days.   OXYCODONE-ACETAMINOPHEN (PERCOCET) 5-325 MG TABLET    Take 1 tablet by mouth every 8 (eight) hours as needed for severe pain (pain score 7-10).   PREVIDENT 5000 PLUS 1.1 % CREA DENTAL CREAM    Place 1 Application onto teeth at bedtime.   SIMETHICONE (MYLICON) 125 MG CHEWABLE TABLET    Chew 125 mg by mouth as needed for flatulence.   TIMOLOL (TIMOPTIC) 0.5 % OPHTHALMIC SOLUTION    Place 1 drop into both eyes in the morning.   UMECLIDINIUM-VILANTEROL (ANORO ELLIPTA) 62.5-25 MCG/ACT AEPB    Inhale 1 puff into the lungs daily.  Modified Medications   Modified Medication Previous Medication   HUMALOG KWIKPEN 100 UNIT/ML KWIKPEN HUMALOG KWIKPEN 100 UNIT/ML KwikPen      Inject 15 Units into the skin 3 (three) times daily.    Inject 5-8 Units into the skin 3 (three)  times daily.   INSULIN DEGLUDEC (TRESIBA) 100 UNIT/ML FLEXTOUCH PEN insulin degludec (TRESIBA) 100 UNIT/ML FlexTouch Pen      Inject 15 Units into the skin daily. 3    Inject 10 Units into the skin daily. 3  Discontinued Medications   GABAPENTIN (NEURONTIN) 300 MG CAPSULE    TAKE 1 CAPSULE BY MOUTH DAILY.   GENTAMICIN  OINTMENT (GARAMYCIN) 0.1 %    Apply 1 Application topically daily.    Physical Exam: Vitals:   01/02/24 0844  BP: 116/62  Pulse: 75  Resp: 16  Temp: (!) 96.7 F (35.9 C)  SpO2: 93%  Weight: 197 lb 12.8 oz (89.7 kg)  Height: 6\' 2"  (1.88 m)   Body mass index is 25.4 kg/m. BP Readings from Last 3 Encounters:  01/02/24 116/62  12/27/23 128/71  12/27/23 106/65   Wt Readings from Last 3 Encounters:  01/02/24 197 lb 12.8 oz (89.7 kg)  12/27/23 180 lb (81.6 kg)  12/26/23 201 lb 12.8 oz (91.5 kg)    Physical Exam Constitutional:      Appearance: Normal appearance.  HENT:     Head: Normocephalic and atraumatic.  Cardiovascular:     Rate and Rhythm: Normal rate and regular rhythm.     Pulses: Normal pulses.     Heart sounds: Normal heart sounds.  Pulmonary:     Effort: No respiratory distress.     Breath sounds: No stridor. No wheezing or rales.  Abdominal:     General: Bowel sounds are normal. There is distension.     Palpations: Abdomen is soft.     Tenderness: There is no abdominal tenderness. There is no guarding.  Neurological:     Mental Status: He is alert. Mental status is at baseline.     Motor: No weakness.     Labs reviewed: Basic Metabolic Panel: Recent Labs    06/15/23 0401 06/16/23 0735 06/21/23 0134 10/01/23 1818 10/01/23 1840 12/07/23 1142 12/27/23 1240  NA 132* 132* 135 136 135 140 139  K 5.0 5.5* 5.0 5.7* 5.5* 4.3 4.2  CL 90* 92* 89* 96*  --  100 93*  CO2 26 24 26 26   --   --  28  GLUCOSE 137* 128* 230* 189*  --  117* 106*  BUN 33* 60* 65* 47*  --  34* 28*  CREATININE 6.71* 9.31* 8.91* 9.97*  --  8.30* 6.56*  CALCIUM 8.6* 9.1 9.6 9.6  --   --  8.9  MG  --   --   --  2.6*  --   --   --   PHOS 4.2 5.7*  --   --   --   --   --    Liver Function Tests: Recent Labs    02/02/23 0000 06/15/23 0401 06/16/23 0735 10/01/23 1818 12/27/23 1240  AST  --   --   --  34 21  ALT  --   --   --  16 18  ALKPHOS 103  --   --  98 68  BILITOT  --    --   --  0.8 0.7  PROT  --   --   --  7.9 8.6*  ALBUMIN 4.1   < > 3.1* 3.2* 3.1*   < > = values in this interval not displayed.   No results for input(s): "LIPASE", "AMYLASE" in the last 8760 hours. Recent Labs    12/27/23 1243  AMMONIA 34   CBC: Recent Labs  02/02/23 0000 06/14/23 0624 06/15/23 0401 06/21/23 0134 10/01/23 1818 10/01/23 1840 12/07/23 1142 12/27/23 1240  WBC 5.6 4.9   < > 5.2 5.7  --   --  6.1  NEUTROABS 49.70 3.0  --   --  3.1  --   --   --   HGB 12.5* 10.3*   < > 9.8* 11.0* 11.6* 11.9* 11.8*  HCT 37* 32.1*   < > 31.4* 34.3* 34.0* 35.0* 36.9*  MCV  --  107.0*   < > 107.2* 108.5*  --   --  105.7*  PLT 146* 150   < > 157 126*  --   --  163   < > = values in this interval not displayed.   Lipid Panel: Recent Labs    12/26/23 1008  CHOL 131  HDL 36*  LDLCALC 75  TRIG 324  CHOLHDL 3.6   TSH: No results for input(s): "TSH" in the last 8760 hours. A1C: Lab Results  Component Value Date   HGBA1C 7.7 (H) 12/26/2023    Assessment and Plan    Chronic Kidney Disease on Dialysis Pt is sleepy and dozing off at times during the visit  Instructed patient that he needs to cut down on his pain medicine Referral was placed to pain clinic for pain management  Informed patient that I would not be doing his refills in the future    Diabetes Mellitus Blood sugar fluctuations with risk of hypoglycemia due to kidney condition. Increase Tresiba to 15 units at night time - Adjust Humalog to 15 units with each meal. - Instruct to skip Humalog if blood sugar is less than 100. - Instruct to reduce Lantus by half if blood sugar is less than 90. Instructed patient to bring his BG log    Constipation Bright red blood on wiping suggests possible hemorrhoids.  CT scan showed diverticulosis without infection or obstruction.  Hemoglobin stable at 11.8. Declined per rectal exam Instructed patient to use stool softeners    HTN At goal Cont with the same   45  min  Total time spent for obtaining history,  performing a medically appropriate examination and evaluation, reviewing the tests,   documenting clinical information in the electronic or other health record,   ,care coordination (not separately reported)

## 2024-01-03 ENCOUNTER — Encounter: Payer: Self-pay | Admitting: Physical Medicine & Rehabilitation

## 2024-01-03 DIAGNOSIS — D631 Anemia in chronic kidney disease: Secondary | ICD-10-CM | POA: Diagnosis not present

## 2024-01-03 DIAGNOSIS — N186 End stage renal disease: Secondary | ICD-10-CM | POA: Diagnosis not present

## 2024-01-03 DIAGNOSIS — D688 Other specified coagulation defects: Secondary | ICD-10-CM | POA: Diagnosis not present

## 2024-01-03 DIAGNOSIS — N2581 Secondary hyperparathyroidism of renal origin: Secondary | ICD-10-CM | POA: Diagnosis not present

## 2024-01-03 DIAGNOSIS — R197 Diarrhea, unspecified: Secondary | ICD-10-CM | POA: Diagnosis not present

## 2024-01-03 DIAGNOSIS — Z992 Dependence on renal dialysis: Secondary | ICD-10-CM | POA: Diagnosis not present

## 2024-01-03 NOTE — Progress Notes (Signed)
 Complex Care Management Care Guide Note  01/03/2024 Name: Jacob [name] MRN: [id number] DOB: Jun 23, 1951  Jacob [name] is a 73 y.o. year old male who is a primary care patient of [name], MD and is actively engaged with the care management team. I reached out to David Stall by phone today to assist with re-scheduling  with the RN Case Manager.  Follow up plan: Telephone appointment with complex care management team member scheduled for:  3/14  Gwenevere Ghazi  [facility]  [facility] Guide  Direct Dial: [phone]  Fax [phone]

## 2024-01-03 NOTE — Progress Notes (Signed)
 Complex Care Management Care Guide Note  01/03/2024 Name: Jacob [name] MRN: [id number] DOB: [birth year]  Jacob [name] is a 73 y.o. year old male who is a primary care patient of [name], MD and is actively engaged with the care management team. I reached out to David Stall by phone today to assist with re-scheduling  with the RN Case Manager.  Follow up plan: Unsuccessful telephone outreach attempt made. A HIPAA compliant phone message was left for the patient providing contact information and requesting a return call.  Gwenevere Ghazi  [facility]  [facility] Guide  Direct Dial: [phone]  Fax [phone]

## 2024-01-04 ENCOUNTER — Ambulatory Visit: Payer: 59 | Admitting: Vascular Surgery

## 2024-01-04 ENCOUNTER — Other Ambulatory Visit: Payer: Self-pay | Admitting: Internal Medicine

## 2024-01-04 ENCOUNTER — Encounter (HOSPITAL_COMMUNITY): Payer: 59

## 2024-01-04 ENCOUNTER — Ambulatory Visit: Admitting: Podiatry

## 2024-01-04 DIAGNOSIS — B20 Human immunodeficiency virus [HIV] disease: Secondary | ICD-10-CM

## 2024-01-05 ENCOUNTER — Ambulatory Visit: Payer: Self-pay

## 2024-01-05 ENCOUNTER — Ambulatory Visit: Payer: Self-pay | Admitting: Sports Medicine

## 2024-01-05 DIAGNOSIS — N186 End stage renal disease: Secondary | ICD-10-CM | POA: Diagnosis not present

## 2024-01-05 DIAGNOSIS — D688 Other specified coagulation defects: Secondary | ICD-10-CM | POA: Diagnosis not present

## 2024-01-05 DIAGNOSIS — R197 Diarrhea, unspecified: Secondary | ICD-10-CM | POA: Diagnosis not present

## 2024-01-05 DIAGNOSIS — N2581 Secondary hyperparathyroidism of renal origin: Secondary | ICD-10-CM | POA: Diagnosis not present

## 2024-01-05 DIAGNOSIS — D631 Anemia in chronic kidney disease: Secondary | ICD-10-CM | POA: Diagnosis not present

## 2024-01-05 DIAGNOSIS — Z992 Dependence on renal dialysis: Secondary | ICD-10-CM | POA: Diagnosis not present

## 2024-01-05 NOTE — Patient Outreach (Signed)
 Care Coordination   Follow Up Visit Note   01/05/2024 Name: Jacob [name] MRN: [id number] DOB: 1951/06/03  ERICBERTO PADGET is a 73 y.o. year old male who sees [name], MD for primary care. I spoke with  David Stall by phone today.  What matters to the patients health and wellness today?  Patient would like to establish with a new PCP.     Goals Addressed             This Visit's Progress    To improve diabetes and lower A1c   On track    Care Coordination Interventions: Evaluation of current treatment plan related to type 2 diabetes and patient's adherence to plan as established by provider  Reviewed medications with patient and discussed recent changes made by PCP Diabetes Mellitus Blood sugar fluctuations with risk of hypoglycemia due to kidney condition. Increase Tresiba to 15 units at night time - Adjust Humalog to 15 units with each meal. - Instruct to skip Humalog if blood sugar is less than 100. - Instruct to reduce Lantus by half if blood sugar is less than 90. Instructed patient to bring his BG log Reiterated to patient, providing education and rationale, to check cbg daily before meals and record, calling PCP for findings outside established parameters Discussed plans with patient for ongoing care coordination follow up and provided patient with direct contact information for nurse care coordinator Placed outbound call to [facility], left a message for Dr. Jacquenette Shone advising of patient's reported hypoglycemic events, blood sugar dropping less than 70, occurring around 2 am when patient awakens to leave for dialysis      To keep chronic pain well managed   On track    Care Coordination Interventions: Reviewed provider established plan for pain management Reviewed and discussed with patient recent PCP referral for pain management  Reviewed and discussed patient's new patient appointment to see Dr. Fanny Dance, MD on 01/30/24 @10 :20 AM   Discussed with patient he needs assistance establishing with a new PCP as previously discussed Placed outbound call to [facility], scheduled a new patient appointment with Dr. De Peru for 02/22/24 @10 :10 AM, patient informed of new patient appointment, date/time, provided location and contact number  Discussed plans with patient for ongoing care coordination follow up and provided patient with direct contact information for nurse care coordinator Scheduled nurse follow up call with patient for 02/27/24 @10 :30 AM       Interventions Today    Flowsheet Row Most Recent Value  Chronic Disease   Chronic disease during today's visit Other, Diabetes  [chronic pain syndrome]  General Interventions   General Interventions Discussed/Reviewed General Interventions Discussed, General Interventions Reviewed, Doctor Visits, Labs, Communication with  Doctor Visits Discussed/Reviewed PCP, Doctor Visits Reviewed, Doctor Visits Discussed, Specialist  Communication with PCP/Specialists  [facility],  Dr. Jacquenette Shone  Education Interventions   Education Provided Provided Education  Provided Verbal Education On When to see the doctor, Blood Sugar Monitoring, Medication  Pharmacy Interventions   Pharmacy Dicussed/Reviewed Pharmacy Topics Discussed, Pharmacy Topics Reviewed, Medications and their functions          SDOH assessments and interventions completed:  No     Care Coordination Interventions:  Yes, provided   Follow up plan: Follow up call scheduled for 02/27/24 @09 :30 AM    Encounter Outcome:  Patient Visit Completed

## 2024-01-05 NOTE — Telephone Encounter (Signed)
  Chief Complaint: low fasting blood sugar Symptoms: feeling cold Frequency: intermittent Pertinent Negatives: Patient denies all other symptoms Disposition: [] ED /[] Urgent Care (no appt availability in office) / [] Appointment(In office/virtual)/ []  [facility]/ [x] Home Care/ [] Refused Recommended Disposition /[] [place] [facility]/ []  Follow-up with PCP Additional Notes:   Patient calling back as requested to report blood sugars. Before dialysis at 3 am his blood sugar was 54 and was feeling cold,  now after dialysis at 11:35 blood sugar is 128, he has warmed back up. Asymptomatic. Declines need for triage, states he does not want his new insulin pen dosage adjusted, just to let Lawanna Kobus know what the numbers are. Care advise per protocol.  Discussed reasons to call back.  Reason for Disposition  Low blood sugar definition and treatment, questions about  Protocols used: Diabetes - Low Blood Sugar-A-AH

## 2024-01-05 NOTE — Telephone Encounter (Signed)
 Phoned patient, patient states that he will call back

## 2024-01-05 NOTE — Patient Instructions (Signed)
 Visit Information  Thank you for taking time to visit with me today. Please don't hesitate to contact me if I can be of assistance to you.   Following are the goals we discussed today:   Goals Addressed             This Visit's Progress    To improve diabetes and lower A1c   On track    Care Coordination Interventions: Evaluation of current treatment plan related to type 2 diabetes and patient's adherence to plan as established by provider  Reviewed medications with patient and discussed recent changes made by PCP Diabetes Mellitus Blood sugar fluctuations with risk of hypoglycemia due to kidney condition. Increase Tresiba to 15 units at night time - Adjust Humalog to 15 units with each meal. - Instruct to skip Humalog if blood sugar is less than 100. - Instruct to reduce Lantus by half if blood sugar is less than 90. Instructed patient to bring his BG log Reiterated to patient, providing education and rationale, to check cbg daily before meals and record, calling PCP for findings outside established parameters Discussed plans with patient for ongoing care coordination follow up and provided patient with direct contact information for nurse care coordinator Placed outbound call to [facility], left a message for Dr. Jacquenette Shone advising of patient's reported hypoglycemic events, blood sugar dropping less than 70, occurring around 2 am when patient awakens to leave for dialysis      To keep chronic pain well managed   On track    Care Coordination Interventions: Reviewed provider established plan for pain management Reviewed and discussed with patient recent PCP referral for pain management  Reviewed and discussed patient's new patient appointment to see Dr. Fanny Dance, MD on 01/30/24 @10 :20 AM  Discussed with patient he needs assistance establishing with a new PCP as previously discussed Placed outbound call to [facility], scheduled a new  patient appointment with Dr. De Peru for 02/22/24 @10 :10 AM, patient informed of new patient appointment, date/time, provided location and contact number  Discussed plans with patient for ongoing care coordination follow up and provided patient with direct contact information for nurse care coordinator Scheduled nurse follow up call with patient for 02/27/24 @10 :30 AM           Our next appointment is by telephone on 02/27/24 at 10:30 AM  Please call the care guide team at [phone] if you need to cancel or reschedule your appointment.   If you are experiencing a Mental Health or Behavioral Health Crisis or need someone to talk to, please call 1-800-273-TALK (toll free, 24 hour hotline)  The patient verbalized understanding of instructions, educational materials, and care plan provided today and DECLINED offer to receive copy of patient instructions, educational materials, and care plan.   Delsa Sale RN BSN CCM   [facility], [facility] Nurse Care Coordinator  Direct Dial: [phone] Website: [email or web address]

## 2024-01-05 NOTE — Telephone Encounter (Signed)
 Forwarded to Dr. Jacquenette Shone as Lorain Childes

## 2024-01-05 NOTE — Telephone Encounter (Signed)
 Left vm for Lawanna Kobus to return call to office  Copied From CRM [id number]. Reason for Triage: Lawanna Kobus (RN) from [facility] states provider decreased patient's diabetic medication: insulin degludec (TRESIBA) 100 UNIT/ML FlexTouch Pen and since then his blood sugar level have been low in the 60's. Lawanna Kobus wants to make provider aware.  She could provide the patient with further instruction once she gets info from provider on what to do. Her call back # is [phone]  Patient is currently at dialysis if he should be contacted he would need some time to get home.*

## 2024-01-08 DIAGNOSIS — R197 Diarrhea, unspecified: Secondary | ICD-10-CM | POA: Diagnosis not present

## 2024-01-08 DIAGNOSIS — N2581 Secondary hyperparathyroidism of renal origin: Secondary | ICD-10-CM | POA: Diagnosis not present

## 2024-01-08 DIAGNOSIS — Z992 Dependence on renal dialysis: Secondary | ICD-10-CM | POA: Diagnosis not present

## 2024-01-08 DIAGNOSIS — D688 Other specified coagulation defects: Secondary | ICD-10-CM | POA: Diagnosis not present

## 2024-01-08 DIAGNOSIS — D631 Anemia in chronic kidney disease: Secondary | ICD-10-CM | POA: Diagnosis not present

## 2024-01-08 DIAGNOSIS — N186 End stage renal disease: Secondary | ICD-10-CM | POA: Diagnosis not present

## 2024-01-09 ENCOUNTER — Other Ambulatory Visit: Payer: Self-pay | Admitting: Sports Medicine

## 2024-01-09 DIAGNOSIS — I5042 Chronic combined systolic (congestive) and diastolic (congestive) heart failure: Secondary | ICD-10-CM | POA: Diagnosis not present

## 2024-01-09 DIAGNOSIS — I132 Hypertensive heart and chronic kidney disease with heart failure and with stage 5 chronic kidney disease, or end stage renal disease: Secondary | ICD-10-CM | POA: Diagnosis not present

## 2024-01-09 DIAGNOSIS — I87313 Chronic venous hypertension (idiopathic) with ulcer of bilateral lower extremity: Secondary | ICD-10-CM | POA: Diagnosis not present

## 2024-01-09 DIAGNOSIS — L97321 Non-pressure chronic ulcer of left ankle limited to breakdown of skin: Secondary | ICD-10-CM | POA: Diagnosis not present

## 2024-01-09 DIAGNOSIS — E1122 Type 2 diabetes mellitus with diabetic chronic kidney disease: Secondary | ICD-10-CM | POA: Diagnosis not present

## 2024-01-09 DIAGNOSIS — L97311 Non-pressure chronic ulcer of right ankle limited to breakdown of skin: Secondary | ICD-10-CM | POA: Diagnosis not present

## 2024-01-09 NOTE — Telephone Encounter (Signed)
 Copied from CRM [id number]. Topic: Clinical - Medication Refill >> Jan 09, 2024 10:41 AM Irine Seal wrote: Most Recent Primary Care Visit:  Provider: [name]  Department: [facility]  Visit Type: TRANSITIONAL CARE 30  Date: 01/02/2024  Medication: oxyCODONE-acetaminophen (PERCOCET) 5-325 MG tablet  Has the patient contacted their pharmacy? No He said he calls in the office every time to refill this controlled substance   Is this the correct pharmacy for this prescription? Yes If no, delete pharmacy and type the correct one.  This is the patient's preferred pharmacy:    [facility] [phone] - [place], [name] - [address] AT [facility] [address] [name] [address] Phone: [phone] Fax: [phone]   Has the prescription been filled recently? No  Is the patient out of the medication? Yes has 2 days left   Has the patient been seen for an appointment in the last year OR does the patient have an upcoming appointment? Yes  Can we respond through MyChart? Yes  Agent: Please be advised that Rx refills may take up to 3 business days. We ask that you follow-up with your pharmacy.

## 2024-01-10 ENCOUNTER — Other Ambulatory Visit: Payer: Self-pay

## 2024-01-10 DIAGNOSIS — R197 Diarrhea, unspecified: Secondary | ICD-10-CM | POA: Diagnosis not present

## 2024-01-10 DIAGNOSIS — D631 Anemia in chronic kidney disease: Secondary | ICD-10-CM | POA: Diagnosis not present

## 2024-01-10 DIAGNOSIS — N2581 Secondary hyperparathyroidism of renal origin: Secondary | ICD-10-CM | POA: Diagnosis not present

## 2024-01-10 DIAGNOSIS — D688 Other specified coagulation defects: Secondary | ICD-10-CM | POA: Diagnosis not present

## 2024-01-10 DIAGNOSIS — N186 End stage renal disease: Secondary | ICD-10-CM | POA: Diagnosis not present

## 2024-01-10 DIAGNOSIS — Z992 Dependence on renal dialysis: Secondary | ICD-10-CM | POA: Diagnosis not present

## 2024-01-10 NOTE — Telephone Encounter (Signed)
 Patient is requesting a refill of the following medications: Requested Prescriptions   Pending Prescriptions Disp Refills   oxyCODONE-acetaminophen (PERCOCET) 5-325 MG tablet 90 tablet 0    Sig: Take 1 tablet by mouth every 8 (eight) hours as needed for severe pain (pain score 7-10).    Date of last refill 12/05/23  Refill amount: 90  Treatment agreement date: not on file, notation made on pending appointment for June 2025

## 2024-01-11 ENCOUNTER — Ambulatory Visit: Payer: Self-pay

## 2024-01-11 ENCOUNTER — Other Ambulatory Visit: Payer: Self-pay

## 2024-01-11 ENCOUNTER — Encounter (HOSPITAL_COMMUNITY): Payer: Self-pay

## 2024-01-11 ENCOUNTER — Emergency Department (HOSPITAL_COMMUNITY)
Admission: EM | Admit: 2024-01-11 | Discharge: 2024-01-12 | Disposition: A | Discharge status: Home / Self Care | Attending: Emergency Medicine | Admitting: Emergency Medicine

## 2024-01-11 ENCOUNTER — Ambulatory Visit

## 2024-01-11 DIAGNOSIS — L97919 Non-pressure chronic ulcer of unspecified part of right lower leg with unspecified severity: Secondary | ICD-10-CM | POA: Diagnosis not present

## 2024-01-11 DIAGNOSIS — R531 Weakness: Secondary | ICD-10-CM | POA: Diagnosis not present

## 2024-01-11 DIAGNOSIS — M1711 Unilateral primary osteoarthritis, right knee: Secondary | ICD-10-CM | POA: Diagnosis not present

## 2024-01-11 DIAGNOSIS — R739 Hyperglycemia, unspecified: Secondary | ICD-10-CM | POA: Diagnosis not present

## 2024-01-11 DIAGNOSIS — L03115 Cellulitis of right lower limb: Secondary | ICD-10-CM | POA: Diagnosis not present

## 2024-01-11 LAB — CBC WITH DIFFERENTIAL/PLATELET
Abs Immature Granulocytes: 0.05 10*3/uL (ref 0.00–0.07)
Basophils Absolute: 0.1 10*3/uL (ref 0.0–0.1)
Basophils Relative: 2 %
Eosinophils Absolute: 0.1 10*3/uL (ref 0.0–0.5)
Eosinophils Relative: 3 %
HCT: 39.2 % (ref 39.0–52.0)
Hemoglobin: 12.6 g/dL — ABNORMAL LOW (ref 13.0–17.0)
Immature Granulocytes: 1 %
Lymphocytes Relative: 24 %
Lymphs Abs: 1.3 10*3/uL (ref 0.7–4.0)
MCH: 34.2 pg — ABNORMAL HIGH (ref 26.0–34.0)
MCHC: 32.1 g/dL (ref 30.0–36.0)
MCV: 106.5 fL — ABNORMAL HIGH (ref 80.0–100.0)
Monocytes Absolute: 0.6 10*3/uL (ref 0.1–1.0)
Monocytes Relative: 10 %
Neutro Abs: 3.3 10*3/uL (ref 1.7–7.7)
Neutrophils Relative %: 60 %
Platelets: 141 10*3/uL — ABNORMAL LOW (ref 150–400)
RBC: 3.68 MIL/uL — ABNORMAL LOW (ref 4.22–5.81)
RDW: 15.5 % (ref 11.5–15.5)
WBC: 5.5 10*3/uL (ref 4.0–10.5)
nRBC: 0 % (ref 0.0–0.2)

## 2024-01-11 LAB — COMPREHENSIVE METABOLIC PANEL
ALT: 17 U/L (ref 0–44)
AST: 43 U/L — ABNORMAL HIGH (ref 15–41)
Albumin: 3.2 g/dL — ABNORMAL LOW (ref 3.5–5.0)
Alkaline Phosphatase: 72 U/L (ref 38–126)
Anion gap: 18 — ABNORMAL HIGH (ref 5–15)
BUN: 36 mg/dL — ABNORMAL HIGH (ref 8–23)
CO2: 27 mmol/L (ref 22–32)
Calcium: 9.7 mg/dL (ref 8.9–10.3)
Chloride: 97 mmol/L — ABNORMAL LOW (ref 98–111)
Creatinine, Ser: 8.63 mg/dL — ABNORMAL HIGH (ref 0.61–1.24)
GFR, Estimated: 6 mL/min — ABNORMAL LOW (ref >60)
Glucose, Bld: 199 mg/dL — ABNORMAL HIGH (ref 70–99)
Potassium: 4.9 mmol/L (ref 3.5–5.1)
Sodium: 142 mmol/L (ref 135–145)
Total Bilirubin: 1.1 mg/dL (ref 0.0–1.2)
Total Protein: 8.1 g/dL (ref 6.5–8.1)

## 2024-01-11 MED ORDER — OXYCODONE-ACETAMINOPHEN 5-325 MG PO TABS: 1.0000 | ORAL_TABLET | Freq: Three times a day (TID) | ORAL | 0 refills | Status: DC | PRN

## 2024-01-11 NOTE — ED Provider Triage Note (Signed)
 Emergency Medicine Provider Triage Evaluation Note  Jacob [name] , a 73 y.o. male  was evaluated in triage.  Pt complains of wound. Report a blister to RLE that started a week ago, and recently ruptured oozing fluid.  No significant pain.  Sts his home health nurse is apply dressing too tight which causes it.  Also report a wound to R buttock for the same duration, worse with prolonged siting.  No fever, chills.  Hx HIV  Review of Systems  Positive: As above Negative: As above  Physical Exam  BP 120/80 (BP Location: Left Arm)   Pulse 94   Temp 98.1 F (36.7 C) (Oral)   Resp 16   Ht 6\' 2"  (1.88 m)   Wt 89.7 kg   SpO2 99%   BMI 25.40 kg/m  Gen:   Awake, no distress   Resp:  Normal effort  MSK:   Moves extremities without difficulty  Other:    Medical Decision Making  Medically screening exam initiated at 7:59 PM.  Appropriate orders placed.  David Stall was informed that the remainder of the evaluation will be completed by another provider, this initial triage assessment does not replace that evaluation, and the importance of remaining in the ED until their evaluation is complete.     Fayrene Helper, PA-C 01/11/24 2000

## 2024-01-11 NOTE — ED Notes (Signed)
 Dressing placed over wound to RLE.

## 2024-01-11 NOTE — Patient Instructions (Addendum)
 Visit Information  Thank you for taking time to visit with me today. Please don't hesitate to contact me if I can be of assistance to you.   Following are the goals we discussed today:   Goals Addressed             This Visit's Progress    To have ankle ulcerations heal without complications   On track    Care Coordination Interventions: Evaluation of current treatment plan related to bilateral ulcerations to inner ankles and patient's adherence to plan as established by provider Determined patient continues to receive in home skilled nursing for wound care via [facility] Discussed patient did not receive his nurse visit this morning, his wounds are draining Placed outbound call to [facility], confirmed a nurse is scheduled to visit patient today for wound care Discussed with patient he is concerned his wounds are worsening, they are draining more and requiring more frequent dressing changes Reviewed and discussed with patient a wound care referral was placed by his Podiatrist in December, discussed with patient it is noted the referral was closed due to patient declined needing the service Determined patient feels he will now benefit from establishing with wound care Placed outbound call to [facility], spoke with Sammuel Cooper regarding patient's request to reopen his referral in order to establish care, she will contact the patient today to get him scheduled  Discussed plans with patient for ongoing care coordination follow up and provided patient with direct contact information for nurse care coordinator Scheduled nurse follow up call with patient for 01/25/24 @09 :30 AM     To keep chronic pain well managed   On track    Care Coordination Interventions: Reviewed provider established plan for pain management Reviewed and discussed with patient recent PCP referral for pain management  Reviewed and discussed patient's new  patient appointment to see Dr. Fanny Dance, MD on 01/30/24 @10 :20 AM  Determined patient has ran out of his Oxycodone x 1 day, he is requesting a refill from his PCP to get him by until he establishes with Pain management Sent secure chat with Dr. Jacquenette Shone requesting a refill for patient's Oxycodone until he establishes with pain management Per Dr. Jacquenette Shone, sent a prescription for 60 pills which should last until he sees pain management, patient informed  Discussed plans with patient for ongoing care coordination follow up and provided patient with direct contact information for nurse care coordinator Scheduled nurse follow up call with patient for 01/25/24 @9 :30 AM Spoke with patient he advised a nurse did not visit him today for wound care, the wound is draining, bleeding and causing him pain Spoke with Wonda Cerise from [facility], they have closed patient from their service Instructed patient to go to the ED for further evaluation, re-iterated his high risk for wound complications, delayed healing and potential amputation, patient verbalizes understanding and is agreeable Reviewed and discussed with patient he is scheduled to go to the wound care clinic on 02/08/24 for his initial evaluation  Sent PCP a secure chat to provide a patient status update and direction that I provided the patient             Our next appointment is by telephone on 01/25/24 at 09:30 AM  Please call the care guide team at [phone] if you need to cancel or reschedule your appointment.   If you are experiencing a Mental Health or  Behavioral Health Crisis or need someone to talk to, please call 1-800-273-TALK (toll free, 24 hour hotline)  The patient verbalized understanding of instructions, educational materials, and care plan provided today and DECLINED offer to receive copy of patient instructions, educational materials, and care plan.   Delsa Sale RN BSN CCM [place]  [facility], [facility] Nurse Care Coordinator  Direct Dial: [phone] Website: [email or web address]

## 2024-01-11 NOTE — Patient Outreach (Addendum)
 Care Coordination   Follow Up Visit Note   01/11/2024 Name: Jacob [name] MRN: [id number] DOB: [birth year]  TSUTOMU BARFOOT is a 73 y.o. year old male who sees [name], MD for primary care. I spoke with  David Stall by phone today.  What matters to the patients health and wellness today?  Patient would like to establish with the wound care clinic to help improve the management of his chronic leg/ankle wounds.     Goals Addressed             This Visit's Progress    To have ankle ulcerations heal without complications   On track    Care Coordination Interventions: Evaluation of current treatment plan related to bilateral ulcerations to inner ankles and patient's adherence to plan as established by provider Determined patient continues to receive in home skilled nursing for wound care via [facility] Discussed patient did not receive his nurse visit this morning, his wounds are draining Placed outbound call to [facility], confirmed a nurse is scheduled to visit patient today for wound care Discussed with patient he is concerned his wounds are worsening, they are draining more and requiring more frequent dressing changes Reviewed and discussed with patient a wound care referral was placed by his Podiatrist in December, discussed with patient it is noted the referral was closed due to patient declined needing the service Determined patient feels he will now benefit from establishing with wound care Placed outbound call to [facility], spoke with Sammuel Cooper regarding patient's request to reopen his referral in order to establish care, she will contact the patient today to get him scheduled  Discussed plans with patient for ongoing care coordination follow up and provided patient with direct contact information for nurse care coordinator Scheduled nurse follow up call with patient for 01/25/24 @09 :30 AM Spoke with  patient he advised a nurse did not visit him today for wound care, the wound is draining, bleeding and causing him pain Spoke with Wonda Cerise from [facility], they have closed patient from their service Instructed patient to go to the ED for further evaluation, re-iterated his high risk for wound complications, delayed healing and potential amputation, patient verbalizes understanding and is agreeable Reviewed and discussed with patient he is scheduled to go to the wound care clinic on 02/08/24 for his initial evaluation  Sent PCP a secure chat to provide a patient status update and direction that I provided the patient         To keep chronic pain well managed   On track    Care Coordination Interventions: Reviewed provider established plan for pain management Reviewed and discussed with patient recent PCP referral for pain management  Reviewed and discussed patient's new patient appointment to see Dr. Fanny Dance, MD on 01/30/24 @10 :20 AM  Determined patient has ran out of his Oxycodone x 1 day, he is requesting a refill from his PCP to get him by until he establishes with Pain management Sent secure chat with Dr. Jacquenette Shone requesting a refill for patient's Oxycodone until he establishes with pain management Per Dr. Jacquenette Shone, sent a prescription for 60 pills which should last until he sees pain management, patient informed  Discussed plans with patient for ongoing care coordination follow up and provided patient with direct contact information for nurse care coordinator Scheduled nurse follow up call with patient for 01/25/24 @9 :30 AM  Interventions Today    Flowsheet Row Most Recent Value  Chronic Disease   Chronic disease during today's visit Other  [chronic pain,  leg wound]  General Interventions   General Interventions Discussed/Reviewed General Interventions Discussed, General Interventions Reviewed, Communication with, Doctor Visits  Doctor Visits  Discussed/Reviewed PCP, Doctor Visits Reviewed, Doctor Visits Discussed, Specialist  Communication with PCP/Specialists  [[facility],  [facility]]  Education Interventions   Education Provided Provided Education  Provided Verbal Education On Medication, When to see the doctor  Pharmacy Interventions   Pharmacy Dicussed/Reviewed Pharmacy Topics Reviewed, Pharmacy Topics Discussed, Medications and their functions          SDOH assessments and interventions completed:  No     Care Coordination Interventions:  Yes, provided   Follow up plan: Follow up call scheduled for 01/25/24 @09 :30 AM    Encounter Outcome:  Patient Visit Completed

## 2024-01-11 NOTE — Telephone Encounter (Signed)
 RN spoke to [place] with home health, RN then made contact with pt.  Chief Complaint: blister to R lower leg Symptoms: pain, pus/bloody discharge Frequency: 3/18 Pertinent Negatives: Patient denies fever Disposition: [x] ED /[] Urgent Care (no appt availability in office) / [] Appointment(In office/virtual)/ []  [place] [facility]/ [] Home Care/ [] Refused Recommended Disposition /[] [place] [facility]/ []  Follow-up with PCP Additional Notes: Morrie Sheldon states that during the nurse visit on 3/11 there was redness noted to the R lower ankle area. During the nurse visit on 3/18 nurse noted that the ankle are was open. Pt is getting weekly nurse visits d/t cont open leg wounds. Pt called Morrie Sheldon today stating that the wound is discharging pus at this time and that he would like a nurse to visit today to see the wound. Morrie Sheldon informed the pt that the orders are for one time per week and that doctors need to place new orders. Morrie Sheldon placed call to PCP to initiate this, but did advise pt that there would be no visit today and that he should seek ED treatment. Pt declined and has been verbally abusive via numerous phone calls to Mc[place]ister and her teammates.  Pt also stated at this time that he would be terminating care with Morrie Sheldon and her team.  Pt states that he wants a nurse to come see him at this time at his home. Pt states that the wound was not wrapped appropriately on 3/18. Pt was triaged and advised that a nurse would not be coming to his home today to evaluate him. Pt was advised to utilize ED.  Pt states that the area is about the size of a nickel and there is a dark/scab area to the middle of the wound. Pt states that there is blood and pus draining from the wound and that he "just needs it rewrapped". Pt advised ED. Unsure if pt will go as the pt responded "I have another call coming in" and proceeded to hangup.        Reason for Disposition  [1] Looks infected (spreading redness, red streak, pus) AND [2]  fever  Answer Assessment - Initial Assessment Questions 1. LOCATION: "Where is the wound located?"      R leg blister 2. WOUND APPEARANCE: "What does the wound look like?"      Blistered, draining red blood and pus, scab in the middle 3. SIZE: If redness is present, ask: "What is the size of the red area?" (Inches, centimeters, or compare to size of a coin)      Blister is about size of nickel, pt states he is not sure if there is redness there  4. SPREAD: "What's changed in the last day?"  "Do you see any red streaks coming from the wound?"     unsure 5. ONSET: "When did it start to look infected?"      Pt states blister has been on his abd for about a week, ongoing pt cannot specify after numerous times asking questions 6. MECHANISM: "How did the wound start, what was the cause?"     Ongoing issue 7. PAIN: "Is there any pain?" If Yes, ask: "How bad is the pain?"   (Scale 1-10; or mild, moderate, severe)     8 8. FEVER: "Do you have a fever?" If Yes, ask: "What is your temperature, how was it measured, and when did it start?"     denies 9. OTHER SYMPTOMS: "Do you have any other symptoms?" (e.g., shaking chills, weakness, rash elsewhere on  body)     Spot on abd  Protocols used: Wound Infection-A-AH   Message sent  to [name], MD

## 2024-01-11 NOTE — ED Triage Notes (Addendum)
 Pt arrived from home via [facility] c/o wound on right medial calf. Pt states that it began as a blister and home health nurse wrapped it too tight and it burst. Pt states that it continues to drain. Zero noted bleeding in triage. 5/10 on pain scale. Pt states that he also has "bumps" on his bottom that hurt 6/10.

## 2024-01-11 NOTE — Telephone Encounter (Signed)
 RN spoke to Gar[place]di with home health, RN then made contact with pt.  Chief Complaint: blister to R lower leg Symptoms: pain, pus/bloody discharge Frequency: 3/18 Pertinent Negatives: Patient denies fever Disposition: [x] ED /[] Urgent Care (no appt availability in office) / [] Appointment(In office/virtual)/ []  [facility]/ [] Home Care/ [] Refused Recommended Disposition /[] [facility]/ []  Follow-up with PCP Additional Notes: Morrie Sheldon states that during the nurse visit on 3/11 there was redness noted to the R lower ankle area. During the nurse visit on 3/18 nurse noted that the ankle are was open. Pt is getting weekly nurse visits d/t cont open leg wounds. Pt called Morrie Sheldon today stating that the wound is discharging pus at this time and that he would like a nurse to visit today to see the wound. Morrie Sheldon informed the pt that the orders are for one time per week and that doctors need to place new orders. Morrie Sheldon placed call to PCP to initiate this, but did advise pt that there would be no visit today and that he should seek ED treatment. Pt declined and has been verbally abusive via numerous phone calls to [place] and her teammates.  Pt also stated at this time that he would be terminating care with Morrie Sheldon and her team.  Pt states that he wants a nurse to come see him at this time at his home. Pt states that the wound was not wrapped appropriately on 3/18. Pt was triaged and advised that a nurse would not be coming to his home today to evaluate him. Pt was advised to utilize ED.  Pt states that the area is about the size of a nickel and there is a dark/scab area to the middle of the wound. Pt states that there is blood and pus draining from the wound and that he "just needs it rewrapped". Pt advised ED. Unsure if pt will go as the pt responded "I have another call coming in" and proceeded to hangup.     Reason for Disposition  [1] Looks infected (spreading redness, red streak, pus) AND [2]  fever  Answer Assessment - Initial Assessment Questions 1. LOCATION: "Where is the wound located?"      R leg blister 2. WOUND APPEARANCE: "What does the wound look like?"      Blistered, draining red blood and pus, scab in the middle 3. SIZE: If redness is present, ask: "What is the size of the red area?" (Inches, centimeters, or compare to size of a coin)      Blister is about size of nickel, pt states he is not sure if there is redness there  4. SPREAD: "What's changed in the last day?"  "Do you see any red streaks coming from the wound?"     unsure 5. ONSET: "When did it start to look infected?"      Pt states blister has been on his abd for about a week, ongoing pt cannot specify after numerous times asking questions 6. MECHANISM: "How did the wound start, what was the cause?"     Ongoing issue 7. PAIN: "Is there any pain?" If Yes, ask: "How bad is the pain?"   (Scale 1-10; or mild, moderate, severe)     8 8. FEVER: "Do you have a fever?" If Yes, ask: "What is your temperature, how was it measured, and when did it start?"     denies 9. OTHER SYMPTOMS: "Do you have any other symptoms?" (e.g., shaking chills, weakness, rash elsewhere on body)  Spot on abd  Protocols used: Wound Infection-A-AH

## 2024-01-12 ENCOUNTER — Telehealth: Payer: Self-pay

## 2024-01-12 ENCOUNTER — Emergency Department (HOSPITAL_COMMUNITY)

## 2024-01-12 ENCOUNTER — Other Ambulatory Visit: Payer: Self-pay

## 2024-01-12 DIAGNOSIS — N2581 Secondary hyperparathyroidism of renal origin: Secondary | ICD-10-CM | POA: Diagnosis not present

## 2024-01-12 DIAGNOSIS — D631 Anemia in chronic kidney disease: Secondary | ICD-10-CM | POA: Diagnosis not present

## 2024-01-12 DIAGNOSIS — D688 Other specified coagulation defects: Secondary | ICD-10-CM | POA: Diagnosis not present

## 2024-01-12 DIAGNOSIS — N186 End stage renal disease: Secondary | ICD-10-CM | POA: Diagnosis not present

## 2024-01-12 DIAGNOSIS — R197 Diarrhea, unspecified: Secondary | ICD-10-CM | POA: Diagnosis not present

## 2024-01-12 DIAGNOSIS — Z992 Dependence on renal dialysis: Secondary | ICD-10-CM | POA: Diagnosis not present

## 2024-01-12 DIAGNOSIS — M1711 Unilateral primary osteoarthritis, right knee: Secondary | ICD-10-CM | POA: Diagnosis not present

## 2024-01-12 DIAGNOSIS — L97919 Non-pressure chronic ulcer of unspecified part of right lower leg with unspecified severity: Secondary | ICD-10-CM | POA: Diagnosis not present

## 2024-01-12 MED ORDER — CLINDAMYCIN HCL 150 MG PO CAPS
150.0000 mg | ORAL_CAPSULE | Freq: Four times a day (QID) | ORAL | 0 refills | Status: AC
Start: 2024-01-12 — End: 2024-01-22

## 2024-01-12 MED ORDER — CLINDAMYCIN HCL 150 MG PO CAPS
300.0000 mg | ORAL_CAPSULE | Freq: Once | ORAL | Status: AC
  Administered 2024-01-12: 300 mg via ORAL
  Filled 2024-01-12: qty 2

## 2024-01-12 NOTE — Telephone Encounter (Signed)
 Received TOC consult for [facility] services as patient was dc at 0311 from [facility] ED. Per chart review patient is high utilizer with 3 ED visits and 1 inpt stay within 60 days. PMH: ESRD ( on dialysis), RLE and R buttock wounds. HHRN orders and F2F are on file.    This RNCM spoke with patient who reports he had "[facility] Nicole and [facility] Tim that was coming out to his home however he reports the manger at [facility]: Virgina Evener was rude to him and dropped him on yesterday." Patient reports he has an appointment with [facility] on 02/09/24. Patient reports he had diaylsis this morning and his mind is not very clear. Patient reports he will call to schedule PCP follow up on Monday. Patient reports his current PCP is not happy with him due to planning to change PCP. Patient reports the [facility] showed him a little about how to wrap his wounds. Patient reports he purchased some gauze and he will wrap himself, until his appointment on 02/09/24. This RNCM encouraged patient to schedule PCP follow up as soon as possible. This RNCM will attempt to coordinate [facility] services, however advised patient the challenges of HH agencies to accept his insurance.   This RNCM spoke with Angie with [facility] who reports the patient was discharged from [facility] yesterday and per [facility] manager will not take the patient back. [facility] notified patient's PCP and [facility] Navigator and they will follow up with coordinating [facility] services.   This RNCM notified the following HH agencies for [facility] services/ unable to get [facility] agencies to accept patient's insurance: Lannette Donath w/[facility]: declined due to out of network w/ insurance  Clifton Custard w/[facility]: declined due to insurance    Lynette w/ [employer]: declined due to insurance   Amy w/[facility]: awaiting a a response

## 2024-01-12 NOTE — ED Provider Notes (Signed)
  EMERGENCY DEPARTMENT AT [facility] Provider Note   CSN: [id number] Arrival date & time: 01/11/24  1927     History  Chief Complaint  Patient presents with   Wound Check    Jacob [name] is a 73 y.o. male.  73 year old male with multiple medical problems who presents ER today for evaluation of a wound on his leg.  Patient states that just started a couple days ago but then subsequently states that actually spread from the wound lower on his leg.  States that home health nurse saw it a couple days ago and wrapped it based when get checked out he is had infections in the past.  Patient is a dialysis patient is due for dialysis this morning.  No fevers, nausea, vomiting or other systemic symptoms.  No significant pain in the area.  He does notice his little bit discolored around the wound.  He thinks is draining pus and blood.   Wound Check       Home Medications Prior to Admission medications   Medication Sig Start Date End Date Taking? Authorizing Provider  clindamycin (CLEOCIN) 150 MG capsule Take 1 capsule (150 mg total) by mouth every 6 (six) hours for 10 days. 01/12/24 01/22/24 Yes Avey Mcmanamon, Barbara Cower, MD  acetaminophen (TYLENOL) 650 MG CR tablet Take 1,300 mg by mouth 3 (three) times daily.    [provider]  albuterol (PROVENTIL) (2.5 MG/3ML) 0.083% nebulizer solution Take 3 mLs (2.5 mg total) by nebulization every 6 (six) hours as needed for wheezing or shortness of breath. 06/20/23 06/19/24  [name], Dinah C, NP  allopurinol (ZYLOPRIM) 100 MG tablet TAKE 1 TABLET BY MOUTH DAILY 11/01/23   [name], MD  atorvastatin (LIPITOR) 10 MG tablet TAKE 1 TABLET(10 MG) BY MOUTH DAILY. 11/23/23   Gaston Islam., NP  B-D UF III MINI PEN NEEDLES 31G X 5 MM MISC USE 4 TIMES DAILY 07/13/23   [name], MD  BIKTARVY 50-200-25 MG TABS tablet TAKE 1 TABLET BY MOUTH 1 TIME A DAY 01/04/24   Gardiner Barefoot, MD  Calcium Acetate 667 MG TABS Take  667-2,001 mg by mouth See admin instructions. Take 2001 mg by mouth three times a day with meals and 667 mg with each snack 01/20/21   [provider]  carvedilol (COREG) 3.125 MG tablet TAKE 1 TABLET(3.125 MG) BY MOUTH TWICE DAILY WITH A MEAL 11/28/23   Gaston Islam., NP  diclofenac Sodium (VOLTAREN) 1 % GEL Apply 2 grams topically to the affected area three times daily as needed for pain. 12/19/22   [name], MD  DOXYCYCLINE HYCLATE PO Take 100 mg by mouth in the morning and at bedtime.    [provider]  folic acid (FOLVITE) 1 MG tablet TAKE 1 TABLET(1 MG) BY MOUTH DAILY 11/27/23   [name], MD  gabapentin (NEURONTIN) 300 MG capsule Take 300 mg by mouth in the morning and at bedtime.    [provider]  glucose blood (ONETOUCH ULTRA) test strip Check blood sugar three times daily E11.40 05/18/23   [name], MD  HUMALOG KWIKPEN 100 UNIT/ML KwikPen Inject 15 Units into the skin 3 (three) times daily. 01/02/24   [name], MD  insulin degludec (TRESIBA) 100 UNIT/ML FlexTouch Pen Inject 15 Units into the skin daily. 3 01/02/24   [name], MD  isosorbide mononitrate (IMDUR) 30 MG 24 hr tablet Take 1 tablet (30 mg total) by mouth daily. 06/05/23  [name], MD  Lancets [facility] DELICA PLUS [place]) MISC USE 3 TIMES DAILY 08/15/22   [name], MD  latanoprost (XALATAN) 0.005 % ophthalmic solution Place 1 drop into both eyes at bedtime.    [provider]  lidocaine-prilocaine (EMLA) cream Apply topically as directed. At dialysis 10/30/23   [provider]  Menthol-Camphor (TIGER BALM ARTHRITIS RUB) 11-11 % CREA Apply 1 Application topically as needed (pain).    [provider]  Methoxy PEG-Epoetin Beta (MIRCERA IJ) as directed. At Dialysis 11/08/23 11/06/24  [provider]  midodrine (PROAMATINE) 10 MG tablet Take 10 mg by mouth 3 (three) times a week. 03/21/23   [provider]  multivitamin (RENA-VIT) TABS tablet Take 1 tablet by mouth daily.    [provider]  neomycin-bacitracin-polymyxin 3.5-256 707 [id number] OINT Apply 1 Application topically in the morning and at bedtime. 12/26/23   [name], MD  octreotide (SANDOSTATIN LAR DEPOT) 10 MG injection Inject 10 mg into the muscle every 28 days. 08/22/23   Hilarie Fredrickson, MD  oxyCODONE-acetaminophen (PERCOCET) 5-325 MG tablet Take 1 tablet by mouth every 8 (eight) hours as needed for severe pain (pain score 7-10). 01/11/24   [name], MD  PREVIDENT 5000 PLUS 1.1 % CREA dental cream Place 1 Application onto teeth at bedtime. 05/25/23   [provider]  simethicone (MYLICON) 125 MG chewable tablet Chew 125 mg by mouth as needed for flatulence.    [provider]  timolol (TIMOPTIC) 0.5 % ophthalmic solution Place 1 drop into both eyes in the morning. 04/04/19   [provider]  umeclidinium-vilanterol (ANORO ELLIPTA) 62.5-25 MCG/ACT AEPB Inhale 1 puff into the lungs daily. 06/07/23   [name], MD      Allergies    Augmentin [amoxicillin-pot clavulanate], Morphine sulfate, Mucinex fast-max, and Amphetamines    Review of Systems   Review of Systems  Physical Exam Updated Vital Signs BP 128/71 (BP Location: Left Arm)   Pulse 86   Temp (!) 97.4 F (36.3 C)   Resp 20   Ht 6\' 2"  (1.88 m)   Wt 89.7 kg   SpO2 93%   BMI 25.40 kg/m  Physical Exam Vitals and nursing note reviewed.  Constitutional:      Appearance: He is well-developed.  HENT:     Head: Normocephalic and atraumatic.  Cardiovascular:     Rate and Rhythm: Normal rate.  Pulmonary:     Effort: Pulmonary effort is normal. No respiratory distress.  Abdominal:     General: There is no distension.  Musculoskeletal:        General: Normal range of motion.     Cervical back: Normal range of motion.  Skin:    Comments: Multiple wounds in various stages of healing on his right and left  leg.  The wound in question is on his right leg medially about 5 cm proximal to his medial malleolus.  Has a good wound bed.  Fresh bandage does not have any purulent drainage or malodorous discharge but does have a significant amount of discolored skin around it that is warm to touch.  Neurological:     Mental Status: He is alert.     ED Results / Procedures / Treatments   Labs (all labs ordered are listed, but only abnormal results are displayed) Labs Reviewed  COMPREHENSIVE METABOLIC PANEL - Abnormal; Notable for the following components:      Result Value   Chloride 97 (*)  Glucose, Bld 199 (*)    BUN 36 (*)    Creatinine, Ser 8.63 (*)    Albumin 3.2 (*)    AST 43 (*)    GFR, Estimated 6 (*)    Anion gap 18 (*)    All other components within normal limits  CBC WITH DIFFERENTIAL/PLATELET - Abnormal; Notable for the following components:   RBC 3.68 (*)    Hemoglobin 12.6 (*)    MCV 106.5 (*)    MCH 34.2 (*)    Platelets 141 (*)    All other components within normal limits    EKG None  Radiology DG Tibia/Fibula Right Result Date: 01/12/2024 CLINICAL DATA:  eval for osteo EXAM: RIGHT TIBIA AND FIBULA - 2 VIEW COMPARISON:  None Available. FINDINGS: Moderate tricompartment degenerative changes within the right knee. Chondrocalcinosis. Diffuse vascular calcifications. No acute bony abnormality. Specifically, no fracture, subluxation, or dislocation. No bone destruction to suggest osteomyelitis. No soft tissue gas. IMPRESSION: No acute bony abnormality. Electronically Signed   By: Charlett Nose M.D.   On: 01/12/2024 01:44    Procedures Procedures    Medications Ordered in ED Medications  clindamycin (CLEOCIN) capsule 300 mg (300 mg Oral Given 01/12/24 0202)    ED Course/ Medical Decision Making/ A&P                                 Medical Decision Making Amount and/or Complexity of Data Reviewed Labs: ordered. Radiology: ordered.  Risk Prescription drug  management.  No fever, leukocytosis or other concern for sepsis.  X-ray done to evaluate for any evidence of osteomyelitis since is negative.  Wound care per nursing.  Will initiate antibiotics for possible cellulitis and prophylaxis for infection and the ulcer.  Does not have a home health nurse anymore so face-to-face and TOC consult placed.   Final Clinical Impression(s) / ED Diagnoses Final diagnoses:  Ulcer of right lower extremity, unspecified ulcer stage (HCC)  Cellulitis of right lower extremity    Rx / DC Orders ED Discharge Orders          Ordered    clindamycin (CLEOCIN) 150 MG capsule  Every 6 hours        01/12/24 0155    Home Health        01/12/24 0202    Face-to-face encounter (required for Medicare/Medicaid patients)       Comments: I Marily Memos certify that this patient is under my care and that I, or a nurse practitioner or physician's assistant working with me, had a face-to-face encounter that meets the physician face-to-face encounter requirements with this patient on 01/12/2024. The encounter with the patient was in whole, or in part for the following medical condition(s) which is the primary reason for home health care (List medical condition): non healing leg ulcers. Further orders per PCP.   01/12/24 0202              Jossiah Smoak, Barbara Cower, MD 01/12/24 [phone]

## 2024-01-12 NOTE — Telephone Encounter (Signed)
 [name], MD  [facility]; [place], [place], [place] hours ago (5:16 PM)    He can be offered a visit in clinic tomorrow to evaluate the wound.

## 2024-01-12 NOTE — Telephone Encounter (Signed)
Called patient and no answer. Voicemail was left with office call back number.   

## 2024-01-12 NOTE — Progress Notes (Signed)
 Specialty Pharmacy Refill Coordination Note  Jacob [name] is a 73 y.o. male contacted today regarding refills of specialty medication(s) Octreotide Acetate (SandoSTATIN LAR [place])   Patient requested Courier to Provider Office   Delivery date: 01/16/24   Verified address: [facility] [address] [place], [place], [address]   Medication will be filled on 01/15/24.

## 2024-01-12 NOTE — ED Notes (Signed)
 Called and left VM for social worker regarding TOC consult

## 2024-01-15 ENCOUNTER — Other Ambulatory Visit: Payer: Self-pay

## 2024-01-15 DIAGNOSIS — Z992 Dependence on renal dialysis: Secondary | ICD-10-CM | POA: Diagnosis not present

## 2024-01-15 DIAGNOSIS — D688 Other specified coagulation defects: Secondary | ICD-10-CM | POA: Diagnosis not present

## 2024-01-15 DIAGNOSIS — N186 End stage renal disease: Secondary | ICD-10-CM | POA: Diagnosis not present

## 2024-01-15 DIAGNOSIS — N2581 Secondary hyperparathyroidism of renal origin: Secondary | ICD-10-CM | POA: Diagnosis not present

## 2024-01-15 DIAGNOSIS — R197 Diarrhea, unspecified: Secondary | ICD-10-CM | POA: Diagnosis not present

## 2024-01-15 DIAGNOSIS — D631 Anemia in chronic kidney disease: Secondary | ICD-10-CM | POA: Diagnosis not present

## 2024-01-15 NOTE — Telephone Encounter (Signed)
 Patient scheduled to see Dr.Veludandi tomorrow 01/16/2024.

## 2024-01-16 ENCOUNTER — Encounter (HOSPITAL_COMMUNITY): Payer: 59

## 2024-01-16 ENCOUNTER — Ambulatory Visit: Payer: Self-pay

## 2024-01-16 ENCOUNTER — Ambulatory Visit: Admitting: Podiatry

## 2024-01-16 ENCOUNTER — Ambulatory Visit (INDEPENDENT_AMBULATORY_CARE_PROVIDER_SITE_OTHER): Admitting: Sports Medicine

## 2024-01-16 ENCOUNTER — Encounter: Payer: Self-pay | Admitting: Sports Medicine

## 2024-01-16 VITALS — BP 136/82 | HR 94 | Temp 97.3°F | Resp 16 | Ht 74.0 in | Wt 199.4 lb

## 2024-01-16 DIAGNOSIS — L97922 Non-pressure chronic ulcer of unspecified part of left lower leg with fat layer exposed: Secondary | ICD-10-CM | POA: Diagnosis not present

## 2024-01-16 DIAGNOSIS — R0989 Other specified symptoms and signs involving the circulatory and respiratory systems: Secondary | ICD-10-CM

## 2024-01-16 DIAGNOSIS — R0602 Shortness of breath: Secondary | ICD-10-CM

## 2024-01-16 NOTE — Patient Instructions (Signed)
 Visit Information  Thank you for taking time to visit with me today. Please don't hesitate to contact me if I can be of assistance to you.   Following are the goals we discussed today:   Goals Addressed             This Visit's Progress    To have ankle ulcerations heal without complications   On track    Care Coordination Interventions: Evaluation of current treatment plan related to bilateral ulcerations to inner ankles and patient's adherence to plan as established by provider Reviewed and discussed with patient his recent ED visit for evaluation of cellulitis to his right lower extremity  Reviewed medications with patient and determined he is taking the prescribed antibiotic as directed Reviewed MD recommendations for wound care, patient was instructed to apply neosporin and cover with a bandage Placed outbound call to [facility] at [facility], spoke with rep who advised an earlier appointment is not available  Discussed if PCP feels the patient needs an earlier appointment, she may contact Dr. Geralyn Corwin DO, directly to request an earlier appointment, patient advised  Reviewed and discussed with patient his post ED follow up with Dr. Jacquenette Shone is scheduled for today, 01/16/24 @1 :40 PM Sent a secure message to PCP to make her aware she may contact Dr. Mikey Bussing directly if patient needs an earlier appointment Discussed plans with patient for ongoing care coordination follow up and provided patient with direct contact information for nurse care coordinator Schedule a nurse follow up call with patient for 01/25/24 @09 :30 AM         Our next appointment is by telephone on 01/25/24 at 09:30 AM  Please call the care guide team at [phone] if you need to cancel or reschedule your appointment.   If you are experiencing a Mental Health or Behavioral Health Crisis or need someone to talk to, please call 1-800-273-TALK (toll free, 24 hour hotline)  The  patient verbalized understanding of instructions, educational materials, and care plan provided today and DECLINED offer to receive copy of patient instructions, educational materials, and care plan.   Delsa Sale RN BSN CCM [place]  [facility], [facility] Nurse Care Coordinator  Direct Dial: [phone] Website: [email or web address]

## 2024-01-16 NOTE — Patient Outreach (Signed)
 Care Coordination   Follow Up Visit Note   01/16/2024 Name: Jacob [name] MRN: [id number] DOB: [birth year]  KHADEEM ROCKETT is a 73 y.o. year old male who sees [name], MD for primary care. I spoke with  David Stall by phone today.  What matters to the patients health and wellness today?  Patient would like to establish with the wound care clinic for management of his cellulitis.     Goals Addressed             This Visit's Progress    To have ankle ulcerations heal without complications   On track    Care Coordination Interventions: Evaluation of current treatment plan related to bilateral ulcerations to inner ankles and patient's adherence to plan as established by provider Reviewed and discussed with patient his recent ED visit for evaluation of cellulitis to his right lower extremity  Reviewed medications with patient and determined he is taking the prescribed antibiotic as directed Reviewed MD recommendations for wound care, patient was instructed to apply neosporin and cover with a bandage Placed outbound call to [facility] at [facility], spoke with rep who advised an earlier appointment is not available  Discussed if PCP feels the patient needs an earlier appointment, she may contact Dr. Geralyn Corwin DO, directly to request an earlier appointment, patient advised  Reviewed and discussed with patient his post ED follow up with Dr. Jacquenette Shone is scheduled for today, 01/16/24 @1 :40 PM Sent a secure message to PCP to make her aware she may contact Dr. Mikey Bussing directly if patient needs an earlier appointment Discussed plans with patient for ongoing care coordination follow up and provided patient with direct contact information for nurse care coordinator Schedule a nurse follow up call with patient for 01/25/24 @09 :30 AM     Interventions Today    Flowsheet Row Most Recent Value  Chronic Disease   Chronic disease during today's  visit Other  [cellulitis to right lower extremity]  General Interventions   General Interventions Discussed/Reviewed General Interventions Discussed, General Interventions Reviewed, Doctor Visits, Communication with  Doctor Visits Discussed/Reviewed PCP, Doctor Visits Reviewed, Doctor Visits Discussed, Specialist  Communication with PCP/Specialists  [PCP Dr. Jacquenette Shone,  [facility]]  Education Interventions   Education Provided Provided Education  Provided Verbal Education On When to see the doctor, Medication  Pharmacy Interventions   Pharmacy Dicussed/Reviewed Pharmacy Topics Discussed, Pharmacy Topics Reviewed, Medications and their functions          SDOH assessments and interventions completed:  No     Care Coordination Interventions:  Yes, provided   Follow up plan: Follow up call scheduled for 01/25/24 @09 :30 AM    Encounter Outcome:  Patient Visit Completed

## 2024-01-16 NOTE — Progress Notes (Signed)
 Careteam: Patient Care Team: [name], MD as PCP - General (Internal Medicine) [name], MD as Consulting Physician (Ophthalmology) [name], MD as Consulting Physician (Cardiology) [name], MD (Inactive) as Consulting Physician (Infectious Diseases) [name], DPM as Consulting Physician (Podiatry) [name], [name], NP as Nurse Practitioner (Family Medicine) [place], [facility],  Kidney Little, Karma Lew, RN as VBCI Care Management  PLACE OF SERVICE:  [facility]  Advanced Directive information    Allergies  Allergen Reactions   Augmentin [Amoxicillin-Pot Clavulanate] Diarrhea and Other (See Comments)    Severe diarrhea   Morphine Sulfate Anaphylaxis    Can't mix with oxycodone    Mucinex Fast-Max Other (See Comments)    Intense sweating    Amphetamines Other (See Comments)    Unknown reaction    Chief Complaint  Patient presents with   Hospitalization Follow-up    01/11/2024 wound.     Discussed the use of AI scribe software for clinical note transcription with the patient, who gave verbal consent to proceed.  History of Present Illness   Jacob [name] "Jacob [name]" is a 73 year old male who presents with concerns about wound care.  He has a leg ulcer that began as a blister approximately two weeks ago. The blister burst, leading to pus and blood discharge. Home health aides have been attending to the wound weekly, primarily by spraying it, wiping it down, and applying an ace bandage. He is concerned that the bandage was too tight, exacerbating the wound.    He experiences difficulty breathing, particularly noting trouble yesterday. He uses a wheelchair for mobility and cannot walk down aisles in stores. He typically sleeps on his left side with one pillow and does not use a hospital bed. He feels 'clogged up' in the mornings and uses Listerine to gargle. No current sore throat.  He attends dialysis sessions  on Monday, Wednesday, and Friday, waking early for these appointments.      He lives alone and uses a [employer] bus for transportation to appointments.         Review of Systems:  Review of Systems  Constitutional:  Negative for chills and fever.  HENT:  Negative for congestion.   Respiratory:  Positive for shortness of breath (exertional). Negative for cough and sputum production.   Cardiovascular:  Negative for chest pain and leg swelling.  Gastrointestinal:  Negative for heartburn and nausea.   Negative unless indicated in HPI.   Past Medical History:  Diagnosis Date   Acute respiratory failure (HCC) 03/01/2018   Anemia    Arthritis    "all over; mostly knees and back" (02/28/2018)   Chronic combined systolic and diastolic CHF (congestive heart failure) (HCC)    Chronic lower back pain    stenosis   Community acquired pneumonia 09/06/2013   COPD (chronic obstructive pulmonary disease) (HCC)    Coronary atherosclerosis of native coronary artery    a. 02/2003 s/p CABG x 2 (VG->RI, VG->RPDA; b. 11/2019 PCI: LM nl, LAD 90d, D3 50, RI 100, LCX 100p, OM3 100 - fills via L->L collats from D2/dLAD, RCA 100p, VG->RPDA ok, VG->RI 95 (3.5x48 Synergy XD DES).   Drug abuse (HCC)    hx; tested for cocaine as recently as 2/08. says he is not using drugs now - avoided defib. for this reason    ESRD (end stage renal disease) (HCC)    Hemo M-W-F- [facility]   Fall at home 10/2020   GERD (gastroesophageal  reflux disease)    takes OTC meds as needed   GI bleeding    a. 11/2019 EGD: angiodysplastic lesions w/ bleeding s/p argon plasma/clipping/epi inj. Multiple admissions for the same.   Glaucoma    uses eye drops daily   Hepatitis B 1968   "tx'd w/isolation; caught it from toilet stools in gym"   History of blood transfusion 03/01/2019   History of colon polyps    benign   History of gout    takes Allopurinol daily as well as Colchicine-if needed (02/28/2018)   History of kidney stones    HTN  (hypertension)    takes Coreg,Imdur.and Apresoline daily   Human immunodeficiency virus (HIV) disease (HCC) dx'd 1995   on Biktarvy as of 12/2020.     Hyperlipidemia    Ischemic cardiomyopathy    a. 01/2019 Echo: EF 40-45%, diffuse HK, mild basal septal hypertrophy. Diast dysfxn. Nl RV size/fxn. Sev dil LA. Triv MR/TR/PR.   Muscle spasm    takes Zanaflex as needed   Myocardial infarction (HCC) ~ 2004/2005   Nocturia    Peripheral neuropathy    takes gabapentin daily   Pneumonia    "at least twice" (02/28/2018)   SDH (subdural hematoma) (HCC)    Syphilis, unspecified    Type II diabetes mellitus (HCC) 2004   Lantus daily.Average fasting blood sugar 125-199   Wears glasses    Wears partial dentures    Past Surgical History:  Procedure Laterality Date   A/V FISTULAGRAM N/A 12/07/2023   Procedure: A/V Fistulagram;  Surgeon: Tyler Pita, MD;  [facility]: [facility] INVASIVE CV LAB;  Service: Cardiovascular;  Laterality: N/A;   AV FISTULA PLACEMENT Left 08/02/2018   Procedure: ARTERIOVENOUS (AV) FISTULA CREATION  left arm radiocephlic;  Surgeon: Cephus Shelling, MD;  Location: [facility] OR;  Service: Vascular;  Laterality: Left;   AV FISTULA PLACEMENT Left 08/01/2019   Procedure: LEFT BRACHIOCEPHALIC ARTERIOVENOUS (AV) FISTULA CREATION;  Surgeon: Larina Earthly, MD;  Location: [facility] OR;  Service: Vascular;  Laterality: Left;   AV FISTULA PLACEMENT Right 04/12/2022   Procedure: RIGHT UPPER EXTREMITY ARTERIOVENOUS (AV)  GRAFT INSERTION USING GORE STRETCH 4-7 MM;  Surgeon: Chuck Hint, MD;  Location: [facility] OR;  Service: Vascular;  Laterality: Right;   BASCILIC VEIN TRANSPOSITION Left 10/03/2019   Procedure: BASILIC VEIN TRANSPOSITION LEFT SECOND STAGE;  Surgeon: Larina Earthly, MD;  Location: [facility] OR;  Service: Vascular;  Laterality: Left;   BIOPSY  01/25/2021   Procedure: BIOPSY;  Surgeon: Sherrilyn Rist, MD;  Location: [facility] ENDOSCOPY;  Service: Gastroenterology;;   CARDIAC CATHETERIZATION  10/2002;  12/19/2004   Hattie Perch 03/08/2011   COLONOSCOPY  2013   Dr.John Marina Goodell    CORONARY ARTERY BYPASS GRAFT  02/24/2003   CABG X2/notes 03/08/2011   CORONARY STENT INTERVENTION N/A 12/19/2019   Procedure: CORONARY STENT INTERVENTION;  Surgeon: Corky Crafts, MD;  [facility];  Service: Cardiovascular;  Laterality: N/A;   CORONARY ULTRASOUND/IVUS N/A 12/19/2019   Procedure: Intravascular Ultrasound/IVUS;  Surgeon: Corky Crafts, MD;  Location: [facility] INVASIVE CV LAB;  Service: Cardiovascular;  Laterality: N/A;   ENTEROSCOPY N/A 01/25/2021   Procedure: ENTEROSCOPY;  Surgeon: Sherrilyn Rist, MD;  Location: [facility] ENDOSCOPY;  Service: Gastroenterology;  Laterality: N/A;   ENTEROSCOPY N/A 02/13/2021   Procedure: ENTEROSCOPY;  Surgeon: Lynann Bologna, MD;  Location: [facility] ENDOSCOPY;  Service: Endoscopy;  Laterality: N/A;   ENTEROSCOPY N/A 05/07/2021   Procedure: ENTEROSCOPY;  Surgeon: Ileene Patrick  P, MD;  Location: [facility] ENDOSCOPY;  Service: Gastroenterology;  Laterality: N/A;   ESOPHAGOGASTRODUODENOSCOPY (EGD) WITH PROPOFOL N/A 02/08/2019   Procedure: ESOPHAGOGASTRODUODENOSCOPY (EGD) WITH PROPOFOL;  Surgeon: Rachael Fee, MD;  Location: [facility] ENDOSCOPY;  Service: Gastroenterology;  Laterality: N/A;   ESOPHAGOGASTRODUODENOSCOPY (EGD) WITH PROPOFOL N/A 12/22/2019   Procedure: ESOPHAGOGASTRODUODENOSCOPY (EGD) WITH PROPOFOL;  Surgeon: Shellia Cleverly, DO;  Location: [facility] ENDOSCOPY;  Service: Gastroenterology;  Laterality: N/A;   ESOPHAGOGASTRODUODENOSCOPY (EGD) WITH PROPOFOL N/A 10/19/2020   Procedure: ESOPHAGOGASTRODUODENOSCOPY (EGD) WITH PROPOFOL;  Surgeon: Lynann Bologna, MD;  Location: [place] ENDOSCOPY;  Service: Endoscopy;  Laterality: N/A;   ESOPHAGOGASTRODUODENOSCOPY (EGD) WITH PROPOFOL N/A 12/22/2020   Procedure: ESOPHAGOGASTRODUODENOSCOPY (EGD) WITH PROPOFOL;  Surgeon: Iva Boop, MD;  Location: [facility] ENDOSCOPY;  Service: Endoscopy;  Laterality: N/A;   ESOPHAGOGASTRODUODENOSCOPY (EGD) WITH  PROPOFOL N/A 01/09/2021   Procedure: ESOPHAGOGASTRODUODENOSCOPY (EGD) WITH PROPOFOL;  Surgeon: Hilarie Fredrickson, MD;  Location: [facility] ENDOSCOPY;  Service: Endoscopy;  Laterality: N/A;   HEMOSTASIS CLIP PLACEMENT  12/22/2019   Procedure: HEMOSTASIS CLIP PLACEMENT;  Surgeon: Shellia Cleverly, DO;  Location: [facility] ENDOSCOPY;  Service: Gastroenterology;;   HEMOSTASIS CLIP PLACEMENT  12/22/2020   Procedure: HEMOSTASIS CLIP PLACEMENT;  Surgeon: Iva Boop, MD;  Location: [facility] ENDOSCOPY;  Service: Endoscopy;;   HEMOSTASIS CONTROL  12/22/2020   Procedure: HEMOSTASIS CONTROL/hemospray;  Surgeon: Iva Boop, MD;  Location: [name] ENDOSCOPY;  Service: Endoscopy;;   HOT HEMOSTASIS N/A 02/08/2019   Procedure: HOT HEMOSTASIS (ARGON PLASMA COAGULATION/BICAP);  Surgeon: Rachael Fee, MD;  Location: [facility] ENDOSCOPY;  Service: Gastroenterology;  Laterality: N/A;   HOT HEMOSTASIS N/A 12/22/2019   Procedure: HOT HEMOSTASIS (ARGON PLASMA COAGULATION/BICAP);  Surgeon: Shellia Cleverly, DO;  Location: [facility] ENDOSCOPY;  Service: Gastroenterology;  Laterality: N/A;   HOT HEMOSTASIS N/A 10/19/2020   Procedure: HOT HEMOSTASIS (ARGON PLASMA COAGULATION/BICAP);  Surgeon: Lynann Bologna, MD;  Location: [facility] ENDOSCOPY;  Service: Endoscopy;  Laterality: N/A;   HOT HEMOSTASIS N/A 12/22/2020   Procedure: HOT HEMOSTASIS (ARGON PLASMA COAGULATION/BICAP);  Surgeon: Iva Boop, MD;  Location: [facility] ENDOSCOPY;  Service: Endoscopy;  Laterality: N/A;   HOT HEMOSTASIS N/A 01/09/2021   Procedure: HOT HEMOSTASIS (ARGON PLASMA COAGULATION/BICAP);  Surgeon: Hilarie Fredrickson, MD;  Location: [facility] ENDOSCOPY;  Service: Endoscopy;  Laterality: N/A;   HOT HEMOSTASIS N/A 01/25/2021   Procedure: HOT HEMOSTASIS (ARGON PLASMA COAGULATION/BICAP);  Surgeon: Sherrilyn Rist, MD;  Location: [facility] ENDOSCOPY;  Service: Gastroenterology;  Laterality: N/A;   HOT HEMOSTASIS N/A 02/13/2021   Procedure: HOT HEMOSTASIS (ARGON PLASMA COAGULATION/BICAP);  Surgeon: Lynann Bologna, MD;   Location: [facility] ENDOSCOPY;  Service: Endoscopy;  Laterality: N/A;   HOT HEMOSTASIS N/A 05/07/2021   Procedure: HOT HEMOSTASIS (ARGON PLASMA COAGULATION/BICAP);  Surgeon: Benancio Deeds, MD;  Location: [facility] ENDOSCOPY;  Service: Gastroenterology;  Laterality: N/A;   INSERTION OF DIALYSIS CATHETER N/A 02/08/2022   Procedure: INSERTION OF TUNNELED DIALYSIS CATHETER;  Surgeon: Chuck Hint, MD;  Location: [facility] OR;  Service: Vascular;  Laterality: N/A;   INTERTROCHANTERIC HIP FRACTURE SURGERY Left 11/2006   Hattie Perch 03/08/2011   IR FLUORO GUIDE CV LINE LEFT  03/14/2022   IR FLUORO GUIDE CV LINE RIGHT  07/24/2019   IR FLUORO GUIDE CV LINE RIGHT  07/30/2019   IR US GUIDE VASC ACCESS RIGHT  07/24/2019   IR US GUIDE VASC ACCESS RIGHT  07/30/2019   LAPAROSCOPIC CHOLECYSTECTOMY  05/2006   LIGATION OF ARTERIOVENOUS  FISTULA Left 02/08/2022   Procedure: LIGATION OF LEFT ARTERIOVENOUS  FISTULA;  Surgeon: Chuck Hint, MD;  Location: [facility] OR;  Service: Vascular;  Laterality: Left;   LIGATION OF COMPETING BRANCHES OF ARTERIOVENOUS FISTULA Left 11/05/2018   Procedure: LIGATION OF COMPETING BRANCHES OF ARTERIOVENOUS FISTULA  LEFT  ARM;  Surgeon: Cephus Shelling, MD;  Location: [facility] OR;  Service: Vascular;  Laterality: Left;   LUMBAR LAMINECTOMY/DECOMPRESSION MICRODISCECTOMY N/A 02/29/2016   Procedure: Left L4-5 Lateral Recess Decompression, Removal Extradural Intraspinal Facet Cyst;  Surgeon: Eldred Manges, MD;  Location: [facility] OR;  Service: Orthopedics;  Laterality: N/A;   METATARSAL HEAD EXCISION Right 07/20/2022   Procedure: Irragation and debridement of right foot wound with extraction of all necrotic soft tissue and bone;  Surgeon: Pilar Plate, DPM;  Location: [facility] OR;  Service: Podiatry;  Laterality: Right;  Right 4th Met head and base of proximal phalanx resection   MULTIPLE TOOTH EXTRACTIONS     ORIF MANDIBULAR FRACTURE Left 08/13/2004   ORIF of left body fracture mandible with KLS Martin  2.3-mm six hole/notes 03/08/2011   RIGHT/LEFT HEART CATH AND CORONARY/GRAFT ANGIOGRAPHY N/A 12/19/2019   Procedure: RIGHT/LEFT HEART CATH AND CORONARY/GRAFT ANGIOGRAPHY;  Surgeon: Corky Crafts, MD;  Location: [facility] INVASIVE CV LAB;  Service: Cardiovascular;  Laterality: N/A;   SCLEROTHERAPY  12/22/2019   Procedure: SCLEROTHERAPY;  Surgeon: Shellia Cleverly, DO;  Location: [facility] ENDOSCOPY;  Service: Gastroenterology;;   Susa Day  02/13/2021   Procedure: Susa Day;  Surgeon: Lynann Bologna, MD;  Location: [facility] ENDOSCOPY;  Service: Endoscopy;;   Social History:   reports that he has been smoking cigarettes. He has a 21.5 pack-year smoking history. He has never been exposed to tobacco smoke. He has never used smokeless tobacco. He reports that he does not currently use alcohol after a past usage of about 12.0 standard drinks of alcohol per week. He reports that he does not currently use drugs after having used the following drugs: Cocaine.  Family History  Problem Relation Age of Onset   Heart failure Father    Hypertension Father    Diabetes Brother    Heart attack Brother    Alzheimer's disease Mother    Stroke Sister    Diabetes Sister    Alzheimer's disease Sister    Hypertension Brother    Diabetes Brother    Drug abuse Brother    Colon cancer Neg Hx     Medications: Patient's Medications  New Prescriptions   No medications on file  Previous Medications   ACETAMINOPHEN (TYLENOL) 650 MG CR TABLET    Take 1,300 mg by mouth 3 (three) times daily.   ALBUTEROL (PROVENTIL) (2.5 MG/3ML) 0.083% NEBULIZER SOLUTION    Take 3 mLs (2.5 mg total) by nebulization every 6 (six) hours as needed for wheezing or shortness of breath.   ALLOPURINOL (ZYLOPRIM) 100 MG TABLET    TAKE 1 TABLET BY MOUTH DAILY   ATORVASTATIN (LIPITOR) 10 MG TABLET    TAKE 1 TABLET(10 MG) BY MOUTH DAILY.   B-D UF III MINI PEN NEEDLES 31G X 5 MM MISC    USE 4 TIMES DAILY   BIKTARVY 50-200-25 MG TABS TABLET    TAKE 1  TABLET BY MOUTH 1 TIME A DAY   CALCIUM ACETATE 667 MG TABS    Take 667-2,001 mg by mouth See admin instructions. Take 2001 mg by mouth three times a day with meals and 667 mg with each snack   CARVEDILOL (COREG) 3.125 MG TABLET    TAKE 1 TABLET(3.125 MG) BY MOUTH TWICE  DAILY WITH A MEAL   CLINDAMYCIN (CLEOCIN) 150 MG CAPSULE    Take 1 capsule (150 mg total) by mouth every 6 (six) hours for 10 days.   DICLOFENAC SODIUM (VOLTAREN) 1 % GEL    Apply 2 grams topically to the affected area three times daily as needed for pain.   DOXYCYCLINE HYCLATE PO    Take 100 mg by mouth in the morning and at bedtime.   FOLIC ACID (FOLVITE) 1 MG TABLET    TAKE 1 TABLET(1 MG) BY MOUTH DAILY   GABAPENTIN (NEURONTIN) 300 MG CAPSULE    Take 300 mg by mouth in the morning and at bedtime.   GLUCOSE BLOOD (ONETOUCH ULTRA) TEST STRIP    Check blood sugar three times daily E11.40   HUMALOG KWIKPEN 100 UNIT/ML KWIKPEN    Inject 15 Units into the skin 3 (three) times daily.   INSULIN DEGLUDEC (TRESIBA) 100 UNIT/ML FLEXTOUCH PEN    Inject 15 Units into the skin daily. 3   ISOSORBIDE MONONITRATE (IMDUR) 30 MG 24 HR TABLET    Take 1 tablet (30 mg total) by mouth daily.   LANCETS (ONETOUCH DELICA PLUS LANCET33G) MISC    USE 3 TIMES DAILY   LATANOPROST (XALATAN) 0.005 % OPHTHALMIC SOLUTION    Place 1 drop into both eyes at bedtime.   LIDOCAINE-PRILOCAINE (EMLA) CREAM    Apply topically as directed. At dialysis   MENTHOL-CAMPHOR (TIGER BALM ARTHRITIS RUB) 11-11 % CREA    Apply 1 Application topically as needed (pain).   METHOXY PEG-EPOETIN BETA (MIRCERA IJ)    as directed. At Dialysis   MIDODRINE (PROAMATINE) 10 MG TABLET    Take 10 mg by mouth 3 (three) times a week.   MULTIVITAMIN (RENA-VIT) TABS TABLET    Take 1 tablet by mouth daily.   NEOMYCIN-BACITRACIN-POLYMYXIN 3.5-684 388 [id number] OINT    Apply 1 Application topically in the morning and at bedtime.   OCTREOTIDE (SANDOSTATIN LAR DEPOT) 10 MG INJECTION    Inject 10 mg into the  muscle every 28 days.   OXYCODONE-ACETAMINOPHEN (PERCOCET) 5-325 MG TABLET    Take 1 tablet by mouth every 8 (eight) hours as needed for severe pain (pain score 7-10).   PREVIDENT 5000 PLUS 1.1 % CREA DENTAL CREAM    Place 1 Application onto teeth at bedtime.   SIMETHICONE (MYLICON) 125 MG CHEWABLE TABLET    Chew 125 mg by mouth as needed for flatulence.   TIMOLOL (TIMOPTIC) 0.5 % OPHTHALMIC SOLUTION    Place 1 drop into both eyes in the morning.   UMECLIDINIUM-VILANTEROL (ANORO ELLIPTA) 62.5-25 MCG/ACT AEPB    Inhale 1 puff into the lungs daily.  Modified Medications   No medications on file  Discontinued Medications   No medications on file    Physical Exam: Vitals:   01/16/24 1344 01/16/24 1435  BP: (!) 140/84 136/82  Pulse: 94   Resp: 16   Temp: (!) 97.3 F (36.3 C)   SpO2: 99%   Weight: 199 lb 6.4 oz (90.4 kg)   Height: 6\' 2"  (1.88 m)    Body mass index is 25.6 kg/m. BP Readings from Last 3 Encounters:  01/16/24 136/82  01/12/24 107/62  01/02/24 116/62   Wt Readings from Last 3 Encounters:  01/16/24 199 lb 6.4 oz (90.4 kg)  01/11/24 197 lb 12.8 oz (89.7 kg)  01/02/24 197 lb 12.8 oz (89.7 kg)    Physical Exam Constitutional:      Appearance: Normal appearance.  HENT:  Head: Normocephalic and atraumatic.  Cardiovascular:     Rate and Rhythm: Normal rate and regular rhythm.     Pulses: Normal pulses.     Heart sounds: Normal heart sounds.  Pulmonary:     Effort: No respiratory distress.     Breath sounds: No stridor. Rales (Rt basal crackles) present. No wheezing.  Abdominal:     General: Bowel sounds are normal. There is distension.     Palpations: Abdomen is soft.     Tenderness: There is no abdominal tenderness. There is no right CVA tenderness or guarding.  Musculoskeletal:        General: No swelling.  Skin:    Comments: 2 cm superficial ulcer on medical side of Rt leg  Fat layer exposed No erythema surrounding the wound Mild tenderness and mild  warmth    Neurological:     Mental Status: He is alert. Mental status is at baseline.     Sensory: No sensory deficit.     Motor: No weakness.     Labs reviewed: Basic Metabolic Panel: Recent Labs    06/15/23 0401 06/16/23 0735 06/21/23 0134 10/01/23 1818 10/01/23 1840 12/07/23 1142 12/27/23 1240 01/11/24 2023  NA 132* 132*   < > 136   < > 140 139 142  K 5.0 5.5*   < > 5.7*   < > 4.3 4.2 4.9  CL 90* 92*   < > 96*  --  100 93* 97*  CO2 26 24   < > 26  --   --  28 27  GLUCOSE 137* 128*   < > 189*  --  117* 106* 199*  BUN 33* 60*   < > 47*  --  34* 28* 36*  CREATININE 6.71* 9.31*   < > 9.97*  --  8.30* 6.56* 8.63*  CALCIUM 8.6* 9.1   < > 9.6  --   --  8.9 9.7  MG  --   --   --  2.6*  --   --   --   --   PHOS 4.2 5.7*  --   --   --   --   --   --    < > = values in this interval not displayed.   Liver Function Tests: Recent Labs    10/01/23 1818 12/27/23 1240 01/11/24 2023  AST 34 21 43*  ALT 16 18 17   ALKPHOS 98 68 72  BILITOT 0.8 0.7 1.1  PROT 7.9 8.6* 8.1  ALBUMIN 3.2* 3.1* 3.2*   No results for input(s): "LIPASE", "AMYLASE" in the last 8760 hours. Recent Labs    12/27/23 1243  AMMONIA 34   CBC: Recent Labs    06/14/23 0624 06/15/23 0401 10/01/23 1818 10/01/23 1840 12/07/23 1142 12/27/23 1240 01/11/24 2023  WBC 4.9   < > 5.7  --   --  6.1 5.5  NEUTROABS 3.0  --  3.1  --   --   --  3.3  HGB 10.3*   < > 11.0*   < > 11.9* 11.8* 12.6*  HCT 32.1*   < > 34.3*   < > 35.0* 36.9* 39.2  MCV 107.0*   < > 108.5*  --   --  105.7* 106.5*  PLT 150   < > 126*  --   --  163 141*   < > = values in this interval not displayed.   Lipid Panel: Recent Labs    12/26/23 1008  CHOL 131  HDL 36*  LDLCALC 75  TRIG 865  CHOLHDL 3.6   TSH: No results for input(s): "TSH" in the last 8760 hours. A1C: Lab Results  Component Value Date   HGBA1C 7.7 (H) 12/26/2023     Assessment and Plan    Leg ulcer Pt reports that he noticed a blister 2 weeks ago which  opened up   No systemic infection or cellulitis.  WBC showed no leukocytosis  X-Jacob [name] ruled out osteomyelitis.  2 cm open wound  Dressing change done  Cont with abx  Pt has appt with wound clinic in 2 weeks   Order home health services for wound care evaluation and management.  Decreased pedal pulses Will order u/s ABI        Shortness of breath Occasional shortness of breath. Oxygen saturation 99%.   Pt is able to speak in full sentences Does not appear to be in distress           No follow-ups on file.: did not bring his medicines, instructed to bring all his meds at next visit

## 2024-01-17 ENCOUNTER — Ambulatory Visit: Payer: Self-pay

## 2024-01-17 DIAGNOSIS — N186 End stage renal disease: Secondary | ICD-10-CM | POA: Diagnosis not present

## 2024-01-17 DIAGNOSIS — Z992 Dependence on renal dialysis: Secondary | ICD-10-CM | POA: Diagnosis not present

## 2024-01-17 DIAGNOSIS — R197 Diarrhea, unspecified: Secondary | ICD-10-CM | POA: Diagnosis not present

## 2024-01-17 DIAGNOSIS — D688 Other specified coagulation defects: Secondary | ICD-10-CM | POA: Diagnosis not present

## 2024-01-17 DIAGNOSIS — D631 Anemia in chronic kidney disease: Secondary | ICD-10-CM | POA: Diagnosis not present

## 2024-01-17 DIAGNOSIS — N2581 Secondary hyperparathyroidism of renal origin: Secondary | ICD-10-CM | POA: Diagnosis not present

## 2024-01-17 NOTE — Patient Instructions (Signed)
 Visit Information  Thank you for taking time to visit with me today. Please don't hesitate to contact me if I can be of assistance to you.   Following are the goals we discussed today:   Goals Addressed             This Visit's Progress    To have ankle ulcerations heal without complications       Care Coordination Interventions: Evaluation of current treatment plan related to cellulitis to left lower extremity and patient's adherence to plan as established by provider Reviewed and discussed with patient his recent post ED follow up with Dr. Jacquenette Shone  Assessment and Plan Leg ulcer Pt reports that he noticed a blister 2 weeks ago which opened up   No systemic infection or cellulitis.  WBC showed no leukocytosis  X-ray ruled out osteomyelitis.  2 cm open wound  Dressing change done  Cont with abx  Pt has appt with wound clinic in 2 weeks   Order home health services for wound care evaluation and management. Decreased pedal pulses Will order u/s ABI    Determined patient verbalizes understanding of his prescribed treatment plan  Educated patient about PCP referral for home health and u/s ABI and next steps Discussed plans with patient for ongoing care coordination follow up and provided patient with direct contact information for nurse care coordinator Schedule a nurse follow up call with patient for 01/25/24 @09 :30 AM         Our next appointment is by telephone on 01/25/24 at 09:30 AM  Please call the care guide team at [phone] if you need to cancel or reschedule your appointment.   If you are experiencing a Mental Health or Behavioral Health Crisis or need someone to talk to, please call 1-800-273-TALK (toll free, 24 hour hotline)  The patient verbalized understanding of instructions, educational materials, and care plan provided today and DECLINED offer to receive copy of patient instructions, educational materials, and care plan.   Delsa Sale RN BSN CCM [facility]  [facility] Nurse Care Coordinator  Direct Dial: [phone] Website: [email or web address]      //

## 2024-01-17 NOTE — Patient Outreach (Signed)
 Care Coordination   Follow Up Visit Note   01/17/2024 Name: Jacob [name] MRN: [id number] DOB: [birth year]  Jacob [name] is a 73 y.o. year old male who sees [name], MD for primary care. I spoke with  David Stall by phone today.  What matters to the patients health and wellness today?  Patient would like to establish with the wound care clinic for treatment of his left leg ulcer.    Goals Addressed             This Visit's Progress    To have ankle ulcerations heal without complications       Care Coordination Interventions: Evaluation of current treatment plan related to cellulitis to left lower extremity and patient's adherence to plan as established by provider Reviewed and discussed with patient his recent post ED follow up with Dr. Jacquenette Shone  Assessment and Plan Leg ulcer Pt reports that he noticed a blister 2 weeks ago which opened up   No systemic infection or cellulitis.  WBC showed no leukocytosis  X-ray ruled out osteomyelitis.  2 cm open wound  Dressing change done  Cont with abx  Pt has appt with wound clinic in 2 weeks   Order home health services for wound care evaluation and management. Decreased pedal pulses Will order u/s ABI    Determined patient verbalizes understanding of his prescribed treatment plan  Educated patient about PCP referral for home health and u/s ABI and next steps Discussed plans with patient for ongoing care coordination follow up and provided patient with direct contact information for nurse care coordinator Schedule a nurse follow up call with patient for 01/25/24 @09 :30 AM     Interventions Today    Flowsheet Row Most Recent Value  Chronic Disease   Chronic disease during today's visit Other  [ulcer of left lower extremity]  General Interventions   General Interventions Discussed/Reviewed General Interventions Discussed, General Interventions Reviewed, Doctor Visits  Doctor Visits Discussed/Reviewed Doctor Visits  Discussed, Doctor Visits Reviewed, PCP  Education Interventions   Education Provided Provided Education  Provided Verbal Education On When to see the doctor          SDOH assessments and interventions completed:  No     Care Coordination Interventions:  Yes, provided   Follow up plan: Follow up call scheduled for 01/25/24 @09 :30 AM    Encounter Outcome:  Patient Visit Completed

## 2024-01-18 ENCOUNTER — Inpatient Hospital Stay (HOSPITAL_COMMUNITY): Admission: RE | Admit: 2024-01-18 | Payer: 59 | Source: Ambulatory Visit

## 2024-01-18 ENCOUNTER — Ambulatory Visit: Payer: 59 | Admitting: Podiatry

## 2024-01-19 DIAGNOSIS — N2581 Secondary hyperparathyroidism of renal origin: Secondary | ICD-10-CM | POA: Diagnosis not present

## 2024-01-19 DIAGNOSIS — D631 Anemia in chronic kidney disease: Secondary | ICD-10-CM | POA: Diagnosis not present

## 2024-01-19 DIAGNOSIS — R197 Diarrhea, unspecified: Secondary | ICD-10-CM | POA: Diagnosis not present

## 2024-01-19 DIAGNOSIS — N186 End stage renal disease: Secondary | ICD-10-CM | POA: Diagnosis not present

## 2024-01-19 DIAGNOSIS — D688 Other specified coagulation defects: Secondary | ICD-10-CM | POA: Diagnosis not present

## 2024-01-19 DIAGNOSIS — Z992 Dependence on renal dialysis: Secondary | ICD-10-CM | POA: Diagnosis not present

## 2024-01-22 ENCOUNTER — Telehealth: Payer: Self-pay

## 2024-01-22 DIAGNOSIS — N186 End stage renal disease: Secondary | ICD-10-CM | POA: Diagnosis not present

## 2024-01-22 DIAGNOSIS — N2581 Secondary hyperparathyroidism of renal origin: Secondary | ICD-10-CM | POA: Diagnosis not present

## 2024-01-22 DIAGNOSIS — Z992 Dependence on renal dialysis: Secondary | ICD-10-CM | POA: Diagnosis not present

## 2024-01-22 DIAGNOSIS — R197 Diarrhea, unspecified: Secondary | ICD-10-CM | POA: Diagnosis not present

## 2024-01-22 DIAGNOSIS — D631 Anemia in chronic kidney disease: Secondary | ICD-10-CM | POA: Diagnosis not present

## 2024-01-22 DIAGNOSIS — D688 Other specified coagulation defects: Secondary | ICD-10-CM | POA: Diagnosis not present

## 2024-01-22 DIAGNOSIS — I129 Hypertensive chronic kidney disease with stage 1 through stage 4 chronic kidney disease, or unspecified chronic kidney disease: Secondary | ICD-10-CM | POA: Diagnosis not present

## 2024-01-22 NOTE — Telephone Encounter (Signed)
 Copied from CRM [id number]. Topic: Appointments - Scheduling Inquiry for Clinic >> Jan 22, 2024  4:20 PM Antony Haste wrote: Reason for CRM: The patient was scheduled on 04/01 tomorrow for his ultrasound, however he was advised they will be unable to complete his ultrasound due to his open wound, He states they have suggested to schedule this at the Hospital instead? Callback [phone]

## 2024-01-22 NOTE — Telephone Encounter (Signed)
Message routed to PCP [name], MD

## 2024-01-23 ENCOUNTER — Emergency Department (HOSPITAL_COMMUNITY)

## 2024-01-23 ENCOUNTER — Other Ambulatory Visit: Payer: Self-pay

## 2024-01-23 ENCOUNTER — Observation Stay (HOSPITAL_COMMUNITY)
Admission: EM | Admit: 2024-01-23 | Discharge: 2024-01-24 | Disposition: A | Discharge status: Home / Self Care | Attending: Internal Medicine | Admitting: Internal Medicine

## 2024-01-23 ENCOUNTER — Other Ambulatory Visit

## 2024-01-23 DIAGNOSIS — D696 Thrombocytopenia, unspecified: Secondary | ICD-10-CM | POA: Insufficient documentation

## 2024-01-23 DIAGNOSIS — Z794 Long term (current) use of insulin: Secondary | ICD-10-CM | POA: Diagnosis not present

## 2024-01-23 DIAGNOSIS — I120 Hypertensive chronic kidney disease with stage 5 chronic kidney disease or end stage renal disease: Secondary | ICD-10-CM | POA: Diagnosis not present

## 2024-01-23 DIAGNOSIS — W19XXXA Unspecified fall, initial encounter: Secondary | ICD-10-CM | POA: Diagnosis not present

## 2024-01-23 DIAGNOSIS — M109 Gout, unspecified: Secondary | ICD-10-CM | POA: Diagnosis not present

## 2024-01-23 DIAGNOSIS — I255 Ischemic cardiomyopathy: Secondary | ICD-10-CM | POA: Diagnosis not present

## 2024-01-23 DIAGNOSIS — B20 Human immunodeficiency virus [HIV] disease: Secondary | ICD-10-CM | POA: Diagnosis not present

## 2024-01-23 DIAGNOSIS — E875 Hyperkalemia: Secondary | ICD-10-CM | POA: Diagnosis not present

## 2024-01-23 DIAGNOSIS — I517 Cardiomegaly: Secondary | ICD-10-CM | POA: Diagnosis not present

## 2024-01-23 DIAGNOSIS — N186 End stage renal disease: Secondary | ICD-10-CM | POA: Diagnosis not present

## 2024-01-23 DIAGNOSIS — E1140 Type 2 diabetes mellitus with diabetic neuropathy, unspecified: Secondary | ICD-10-CM | POA: Insufficient documentation

## 2024-01-23 DIAGNOSIS — F17210 Nicotine dependence, cigarettes, uncomplicated: Secondary | ICD-10-CM | POA: Insufficient documentation

## 2024-01-23 DIAGNOSIS — J810 Acute pulmonary edema: Secondary | ICD-10-CM

## 2024-01-23 DIAGNOSIS — G8929 Other chronic pain: Secondary | ICD-10-CM | POA: Diagnosis not present

## 2024-01-23 DIAGNOSIS — Z951 Presence of aortocoronary bypass graft: Secondary | ICD-10-CM | POA: Insufficient documentation

## 2024-01-23 DIAGNOSIS — I5043 Acute on chronic combined systolic (congestive) and diastolic (congestive) heart failure: Secondary | ICD-10-CM | POA: Diagnosis not present

## 2024-01-23 DIAGNOSIS — R0602 Shortness of breath: Secondary | ICD-10-CM | POA: Diagnosis not present

## 2024-01-23 DIAGNOSIS — E8770 Fluid overload, unspecified: Principal | ICD-10-CM | POA: Diagnosis present

## 2024-01-23 DIAGNOSIS — I959 Hypotension, unspecified: Secondary | ICD-10-CM | POA: Insufficient documentation

## 2024-01-23 DIAGNOSIS — R1084 Generalized abdominal pain: Secondary | ICD-10-CM | POA: Diagnosis not present

## 2024-01-23 DIAGNOSIS — Z79899 Other long term (current) drug therapy: Secondary | ICD-10-CM | POA: Diagnosis not present

## 2024-01-23 DIAGNOSIS — R0989 Other specified symptoms and signs involving the circulatory and respiratory systems: Secondary | ICD-10-CM | POA: Diagnosis not present

## 2024-01-23 DIAGNOSIS — S81801A Unspecified open wound, right lower leg, initial encounter: Secondary | ICD-10-CM | POA: Insufficient documentation

## 2024-01-23 DIAGNOSIS — E1122 Type 2 diabetes mellitus with diabetic chronic kidney disease: Secondary | ICD-10-CM | POA: Diagnosis not present

## 2024-01-23 DIAGNOSIS — Z992 Dependence on renal dialysis: Secondary | ICD-10-CM | POA: Insufficient documentation

## 2024-01-23 DIAGNOSIS — I132 Hypertensive heart and chronic kidney disease with heart failure and with stage 5 chronic kidney disease, or end stage renal disease: Secondary | ICD-10-CM | POA: Diagnosis not present

## 2024-01-23 DIAGNOSIS — I5A Non-ischemic myocardial injury (non-traumatic): Secondary | ICD-10-CM | POA: Diagnosis not present

## 2024-01-23 DIAGNOSIS — Z716 Tobacco abuse counseling: Secondary | ICD-10-CM

## 2024-01-23 DIAGNOSIS — J449 Chronic obstructive pulmonary disease, unspecified: Secondary | ICD-10-CM | POA: Insufficient documentation

## 2024-01-23 DIAGNOSIS — J811 Chronic pulmonary edema: Secondary | ICD-10-CM | POA: Diagnosis not present

## 2024-01-23 DIAGNOSIS — E785 Hyperlipidemia, unspecified: Secondary | ICD-10-CM | POA: Insufficient documentation

## 2024-01-23 DIAGNOSIS — D631 Anemia in chronic kidney disease: Secondary | ICD-10-CM | POA: Diagnosis not present

## 2024-01-23 DIAGNOSIS — I2510 Atherosclerotic heart disease of native coronary artery without angina pectoris: Secondary | ICD-10-CM | POA: Diagnosis not present

## 2024-01-23 DIAGNOSIS — E8779 Other fluid overload: Secondary | ICD-10-CM | POA: Diagnosis not present

## 2024-01-23 LAB — CBC WITH DIFFERENTIAL/PLATELET
Abs Immature Granulocytes: 0.14 10*3/uL — ABNORMAL HIGH (ref 0.00–0.07)
Basophils Absolute: 0.1 10*3/uL (ref 0.0–0.1)
Basophils Relative: 1 %
Eosinophils Absolute: 0.2 10*3/uL (ref 0.0–0.5)
Eosinophils Relative: 3 %
HCT: 39 % (ref 39.0–52.0)
Hemoglobin: 12.3 g/dL — ABNORMAL LOW (ref 13.0–17.0)
Immature Granulocytes: 2 %
Lymphocytes Relative: 23 %
Lymphs Abs: 1.4 10*3/uL (ref 0.7–4.0)
MCH: 34.4 pg — ABNORMAL HIGH (ref 26.0–34.0)
MCHC: 31.5 g/dL (ref 30.0–36.0)
MCV: 108.9 fL — ABNORMAL HIGH (ref 80.0–100.0)
Monocytes Absolute: 0.8 10*3/uL (ref 0.1–1.0)
Monocytes Relative: 13 %
Neutro Abs: 3.5 10*3/uL (ref 1.7–7.7)
Neutrophils Relative %: 58 %
Platelets: 124 10*3/uL — ABNORMAL LOW (ref 150–400)
RBC: 3.58 MIL/uL — ABNORMAL LOW (ref 4.22–5.81)
RDW: 16 % — ABNORMAL HIGH (ref 11.5–15.5)
WBC: 5.9 10*3/uL (ref 4.0–10.5)
nRBC: 0 % (ref 0.0–0.2)

## 2024-01-23 LAB — BASIC METABOLIC PANEL WITH GFR
Anion gap: 16 — ABNORMAL HIGH (ref 5–15)
BUN: 38 mg/dL — ABNORMAL HIGH (ref 8–23)
CO2: 27 mmol/L (ref 22–32)
Calcium: 9.3 mg/dL (ref 8.9–10.3)
Chloride: 96 mmol/L — ABNORMAL LOW (ref 98–111)
Creatinine, Ser: 9.16 mg/dL — ABNORMAL HIGH (ref 0.61–1.24)
GFR, Estimated: 6 mL/min — ABNORMAL LOW (ref >60)
Glucose, Bld: 179 mg/dL — ABNORMAL HIGH (ref 70–99)
Potassium: 4.6 mmol/L (ref 3.5–5.1)
Sodium: 139 mmol/L (ref 135–145)

## 2024-01-23 LAB — TROPONIN I (HIGH SENSITIVITY)
Troponin I (High Sensitivity): 64 ng/L — ABNORMAL HIGH (ref <18)
Troponin I (High Sensitivity): 60 ng/L — ABNORMAL HIGH (ref <18)

## 2024-01-23 LAB — BRAIN NATRIURETIC PEPTIDE: B Natriuretic Peptide: 2378.1 pg/mL — ABNORMAL HIGH (ref 0.0–100.0)

## 2024-01-23 MED ORDER — ACETAMINOPHEN 500 MG PO TABS: 1000.0000 mg | ORAL_TABLET | Freq: Four times a day (QID) | ORAL | Status: DC | PRN

## 2024-01-23 MED ORDER — MELATONIN 3 MG PO TABS: 6.0000 mg | ORAL_TABLET | Freq: Every evening | ORAL | Status: DC | PRN

## 2024-01-23 MED ORDER — INSULIN ASPART 100 UNIT/ML IJ SOLN: 0.0000 [IU] | Freq: Three times a day (TID) | INTRAMUSCULAR | Status: DC

## 2024-01-23 MED ORDER — POLYETHYLENE GLYCOL 3350 17 G PO PACK: 17.0000 g | PACK | Freq: Every day | ORAL | Status: DC | PRN

## 2024-01-23 MED ORDER — MIDODRINE HCL 5 MG PO TABS
10.0000 mg | ORAL_TABLET | Freq: Every day | ORAL | Status: DC | PRN
  Administered 2024-01-24: 10 mg via ORAL
  Filled 2024-01-23: qty 2

## 2024-01-23 MED ORDER — CHLORHEXIDINE GLUCONATE CLOTH 2 % EX PADS: 6.0000 | MEDICATED_PAD | Freq: Every day | CUTANEOUS | Status: DC

## 2024-01-23 MED ORDER — SODIUM CHLORIDE 0.9% FLUSH: 3.0000 mL | Freq: Two times a day (BID) | INTRAVENOUS | Status: DC

## 2024-01-23 NOTE — Telephone Encounter (Signed)
 [name], MD  You; Psc Clinical2 hours ago (1:21 PM)    Has it been rescheduled at hospital?  Patient was on the phone with the hospital while talking the patient   Message sent to [name], MD

## 2024-01-23 NOTE — Telephone Encounter (Signed)
 Message routed to Clinical intake for today [place].B/CMA

## 2024-01-23 NOTE — ED Provider Notes (Signed)
 [place] EMERGENCY DEPARTMENT AT [facility] Provider Note   CSN: [id number] Arrival date & time: 01/23/24  1802     History  Chief Complaint  Patient presents with   Shortness of Breath    Jacob [name] is a 73 y.o. male.  HPI   73 year old male presents emergency department with concern for shortness of breath, orthopnea.  Patient is ESRD on HD every MWF.  He did go yesterday but states because of his blood pressure they were not able to take off his normal amount.  All day today he has been complaining of worsening shortness of breath, orthopnea.  Denies any acute chest pain, productive cough, leg swelling.  He said decreased appetite and nausea but no vomiting or diarrhea.  Endorses generalized weakness and states he is otherwise compliant with his home medications.  Home Medications Prior to Admission medications   Medication Sig Start Date End Date Taking? Authorizing Provider  acetaminophen (TYLENOL) 650 MG CR tablet Take 1,300 mg by mouth 3 (three) times daily.    [provider]  albuterol (PROVENTIL) (2.5 MG/3ML) 0.083% nebulizer solution Take 3 mLs (2.5 mg total) by nebulization every 6 (six) hours as needed for wheezing or shortness of breath. 06/20/23 06/19/24  [name], Dinah C, NP  allopurinol (ZYLOPRIM) 100 MG tablet TAKE 1 TABLET BY MOUTH DAILY 11/01/23   [name], MD  atorvastatin (LIPITOR) 10 MG tablet TAKE 1 TABLET(10 MG) BY MOUTH DAILY. 11/23/23   Gaston Islam., NP  B-D UF III MINI PEN NEEDLES 31G X 5 MM MISC USE 4 TIMES DAILY 07/13/23   [name], MD  BIKTARVY 50-200-25 MG TABS tablet TAKE 1 TABLET BY MOUTH 1 TIME A DAY 01/04/24   Gardiner Barefoot, MD  Calcium Acetate 667 MG TABS Take 667-2,001 mg by mouth See admin instructions. Take 2001 mg by mouth three times a day with meals and 667 mg with each snack 01/20/21   [provider]  carvedilol (COREG) 3.125 MG tablet TAKE 1 TABLET(3.125 MG) BY MOUTH TWICE DAILY  WITH A MEAL 11/28/23   Gaston Islam., NP  diclofenac Sodium (VOLTAREN) 1 % GEL Apply 2 grams topically to the affected area three times daily as needed for pain. 12/19/22   [name], MD  DOXYCYCLINE HYCLATE PO Take 100 mg by mouth in the morning and at bedtime.    [provider]  folic acid (FOLVITE) 1 MG tablet TAKE 1 TABLET(1 MG) BY MOUTH DAILY 11/27/23   [name], MD  gabapentin (NEURONTIN) 300 MG capsule Take 300 mg by mouth in the morning and at bedtime.    [provider]  glucose blood (ONETOUCH ULTRA) test strip Check blood sugar three times daily E11.40 05/18/23   [name], MD  HUMALOG KWIKPEN 100 UNIT/ML KwikPen Inject 15 Units into the skin 3 (three) times daily. 01/02/24   [name], MD  insulin degludec (TRESIBA) 100 UNIT/ML FlexTouch Pen Inject 15 Units into the skin daily. 3 01/02/24   [name], MD  isosorbide mononitrate (IMDUR) 30 MG 24 hr tablet Take 1 tablet (30 mg total) by mouth daily. 06/05/23   [name], MD  Lancets [facility] DELICA PLUS [place]) MISC USE 3 TIMES DAILY 08/15/22   [name], MD  latanoprost (XALATAN) 0.005 % ophthalmic solution Place 1 drop into both eyes at bedtime.    [provider]  lidocaine-prilocaine (EMLA) cream Apply topically as directed. At dialysis 10/30/23  [provider]  Menthol-Camphor (TIGER BALM ARTHRITIS RUB) 11-11 % CREA Apply 1 Application topically as needed (pain).    [provider]  Methoxy PEG-Epoetin Beta (MIRCERA IJ) as directed. At Dialysis 11/08/23 11/06/24  [provider]  midodrine (PROAMATINE) 10 MG tablet Take 10 mg by mouth 3 (three) times a week. 03/21/23   [provider]  multivitamin (RENA-VIT) TABS tablet Take 1 tablet by mouth daily.    [provider]  neomycin-bacitracin-polymyxin [phone] OINT Apply 1 Application topically in the morning and at bedtime. 12/26/23   [name], MD  octreotide (SANDOSTATIN LAR DEPOT) 10 MG injection Inject 10 mg into the muscle every 28 days. 08/22/23   Hilarie Fredrickson, MD  oxyCODONE-acetaminophen (PERCOCET) 5-325 MG tablet Take 1 tablet by mouth every 8 (eight) hours as needed for severe pain (pain score 7-10). 01/11/24   [name], MD  PREVIDENT 5000 PLUS 1.1 % CREA dental cream Place 1 Application onto teeth at bedtime. 05/25/23   [provider]  simethicone (MYLICON) 125 MG chewable tablet Chew 125 mg by mouth as needed for flatulence.    [provider]  timolol (TIMOPTIC) 0.5 % ophthalmic solution Place 1 drop into both eyes in the morning. 04/04/19   [provider]  umeclidinium-vilanterol (ANORO ELLIPTA) 62.5-25 MCG/ACT AEPB Inhale 1 puff into the lungs daily. 06/07/23   [name], MD      Allergies    Augmentin [amoxicillin-pot clavulanate], Morphine sulfate, Mucinex fast-max, and Amphetamines    Review of Systems   Review of Systems  Constitutional:  Positive for fatigue. Negative for fever.  Respiratory:  Positive for shortness of breath. Negative for cough and chest tightness.   Cardiovascular:  Negative for chest pain and leg swelling.  Gastrointestinal:  Positive for nausea. Negative for abdominal pain, diarrhea and vomiting.  Skin:  Negative for rash.  Neurological:  Negative for headaches.    Physical Exam Updated Vital Signs BP (!) 127/90   Pulse 79   Temp 97.7 F (36.5 C) (Oral)   Resp (!) 25   Ht 6\' 2"  (1.88 m)   Wt 90.5 kg   SpO2 100%   BMI 25.62 kg/m  Physical Exam Vitals and nursing note reviewed.  Constitutional:      General: He is not in acute distress.    Appearance: Normal appearance.  HENT:     Head: Normocephalic.     Mouth/Throat:     Mouth: Mucous membranes are moist.  Cardiovascular:     Rate and Rhythm: Normal rate.  Pulmonary:     Effort: Pulmonary effort is normal. No tachypnea or respiratory distress.     Breath sounds:  Examination of the right-lower field reveals decreased breath sounds. Examination of the left-lower field reveals decreased breath sounds. Decreased breath sounds and rales present.  Abdominal:     Palpations: Abdomen is soft.     Tenderness: There is no abdominal tenderness.  Skin:    General: Skin is warm.  Neurological:     Mental Status: He is alert and oriented to person, place, and time. Mental status is at baseline.  Psychiatric:        Mood and Affect: Mood normal.     ED Results / Procedures / Treatments   Labs (all labs ordered are listed, but only abnormal results are displayed) Labs Reviewed  CBC WITH DIFFERENTIAL/PLATELET - Abnormal; Notable for the following components:      Result Value   RBC 3.58 (*)  Hemoglobin 12.3 (*)    MCV 108.9 (*)    MCH 34.4 (*)    RDW 16.0 (*)    Platelets 124 (*)    Abs Immature Granulocytes 0.14 (*)    All other components within normal limits  BASIC METABOLIC PANEL WITH GFR - Abnormal; Notable for the following components:   Chloride 96 (*)    Glucose, Bld 179 (*)    BUN 38 (*)    Creatinine, Ser 9.16 (*)    GFR, Estimated 6 (*)    Anion gap 16 (*)    All other components within normal limits  BRAIN NATRIURETIC PEPTIDE - Abnormal; Notable for the following components:   B Natriuretic Peptide 2,378.1 (*)    All other components within normal limits  TROPONIN I (HIGH SENSITIVITY) - Abnormal; Notable for the following components:   Troponin I (High Sensitivity) 64 (*)    All other components within normal limits  TROPONIN I (HIGH SENSITIVITY)    EKG EKG Interpretation Date/Time:  Tuesday January 23 2024 18:22:42 EDT Ventricular Rate:  78 PR Interval:  235 QRS Duration:  150 QT Interval:  484 QTC Calculation: 552 R Axis:   -47  Text Interpretation: Sinus rhythm Ventricular premature complex Prolonged PR interval Ventricular preexcitation(WPW) Confirmed by Coralee Pesa [id number]) on 01/23/2024 6:25:00 PM  Radiology DG Chest  Port 1 View Result Date: 01/23/2024 CLINICAL DATA:  Shortness of breath EXAM: PORTABLE CHEST 1 VIEW COMPARISON:  12/27/2023 FINDINGS: Prior CABG. Cardiomegaly with vascular congestion. Diffuse interstitial opacities throughout the lungs, likely edema. No effusions or acute bony abnormality. IMPRESSION: Cardiomegaly with vascular congestion and mild-to-moderate interstitial edema. Electronically Signed   By: Charlett Nose M.D.   On: 01/23/2024 20:37    Procedures Procedures    Medications Ordered in ED Medications - No data to display  ED Course/ Medical Decision Making/ A&P                                 Medical Decision Making Amount and/or Complexity of Data Reviewed Labs: ordered. Radiology: ordered.  Risk Decision regarding hospitalization.   73 year old male presents emergency department for worsening shortness of breath, orthopnea.  Is compliant with dialysis but states that this taken off less fluid secondary to a low blood pressure.  Patient is at times tachypneic, becomes hypoxic into the high 80s with conversation but is otherwise on room air.  Has scattered rales on lung auscultation.  Workup is significant for worsening pulmonary edema, CHF and chronic kidney disease without acute electrolyte abnormality.  Troponin is elevated but baseline and flat.  Consulted with nephrologist, Dr. Valentino Nose.  Unclear if he will be dialyzed overnight or first thing tomorrow morning.  He will be placed on the list and admitted medically.  Does not make urine, not a diuresis candidate.  Patients evaluation and results requires admission for further treatment and care.  Spoke with hospitalist, reviewed patient's ED course and they accept admission.  Patient agrees with admission plan, offers no new complaints and is stable/unchanged at time of admit.        Final Clinical Impression(s) / ED Diagnoses Final diagnoses:  None    Rx / DC Orders ED Discharge Orders     None          Rozelle Logan, DO 01/23/24 2308

## 2024-01-23 NOTE — ED Triage Notes (Signed)
 [facility] ems: Pt coming from yesterday pt had dialysis. Pt feels they did not draw enough fluid and pt started having pain in abdominal,nausea, and sob.  Pt called today to bring him to ed for dialysis. Pt has also been experiencing generalized weakness.Hx of dialysis,asthma, ckd,chf,diabetes, gout,hiv, end stage renal disease.  EKG vitas: Cbg 186 Bp 144/78 Hr 80 Spo2 96

## 2024-01-23 NOTE — Telephone Encounter (Signed)
 Spoke with patient and patient was not able to do the ultrasound due to the open wound. Patient is aware that [name], MD will be notify.   Message sent to [name], MD

## 2024-01-24 ENCOUNTER — Observation Stay (HOSPITAL_COMMUNITY)

## 2024-01-24 ENCOUNTER — Other Ambulatory Visit (HOSPITAL_COMMUNITY)

## 2024-01-24 DIAGNOSIS — I5043 Acute on chronic combined systolic (congestive) and diastolic (congestive) heart failure: Secondary | ICD-10-CM | POA: Diagnosis not present

## 2024-01-24 DIAGNOSIS — I120 Hypertensive chronic kidney disease with stage 5 chronic kidney disease or end stage renal disease: Secondary | ICD-10-CM | POA: Diagnosis not present

## 2024-01-24 DIAGNOSIS — N186 End stage renal disease: Secondary | ICD-10-CM | POA: Diagnosis not present

## 2024-01-24 DIAGNOSIS — Z716 Tobacco abuse counseling: Secondary | ICD-10-CM

## 2024-01-24 DIAGNOSIS — Z992 Dependence on renal dialysis: Secondary | ICD-10-CM | POA: Diagnosis not present

## 2024-01-24 DIAGNOSIS — J810 Acute pulmonary edema: Secondary | ICD-10-CM | POA: Diagnosis not present

## 2024-01-24 DIAGNOSIS — R0602 Shortness of breath: Secondary | ICD-10-CM | POA: Diagnosis not present

## 2024-01-24 DIAGNOSIS — E8779 Other fluid overload: Secondary | ICD-10-CM

## 2024-01-24 DIAGNOSIS — D631 Anemia in chronic kidney disease: Secondary | ICD-10-CM | POA: Diagnosis not present

## 2024-01-24 DIAGNOSIS — E8770 Fluid overload, unspecified: Secondary | ICD-10-CM | POA: Diagnosis not present

## 2024-01-24 LAB — CBG MONITORING, ED
Glucose-Capillary: 128 mg/dL — ABNORMAL HIGH (ref 70–99)
Glucose-Capillary: 153 mg/dL — ABNORMAL HIGH (ref 70–99)

## 2024-01-24 LAB — BASIC METABOLIC PANEL WITH GFR
Anion gap: 13 (ref 5–15)
BUN: 39 mg/dL — ABNORMAL HIGH (ref 8–23)
CO2: 30 mmol/L (ref 22–32)
Calcium: 9.1 mg/dL (ref 8.9–10.3)
Chloride: 97 mmol/L — ABNORMAL LOW (ref 98–111)
Creatinine, Ser: 9.67 mg/dL — ABNORMAL HIGH (ref 0.61–1.24)
GFR, Estimated: 5 mL/min — ABNORMAL LOW (ref >60)
Glucose, Bld: 128 mg/dL — ABNORMAL HIGH (ref 70–99)
Potassium: 6.1 mmol/L — ABNORMAL HIGH (ref 3.5–5.1)
Sodium: 140 mmol/L (ref 135–145)

## 2024-01-24 LAB — MAGNESIUM: Magnesium: 2.6 mg/dL — ABNORMAL HIGH (ref 1.7–2.4)

## 2024-01-24 LAB — PHOSPHORUS: Phosphorus: 6.8 mg/dL — ABNORMAL HIGH (ref 2.5–4.6)

## 2024-01-24 MED ORDER — TIMOLOL MALEATE 0.5 % OP SOLN
1.0000 [drp] | Freq: Every morning | OPHTHALMIC | Status: DC
  Filled 2024-01-24: qty 5

## 2024-01-24 MED ORDER — NICOTINE POLACRILEX 2 MG MT GUM: 2.0000 mg | CHEWING_GUM | OROMUCOSAL | Status: DC | PRN

## 2024-01-24 MED ORDER — HEPARIN SODIUM (PORCINE) 1000 UNIT/ML DIALYSIS
1000.0000 [IU] | INTRAMUSCULAR | Status: DC | PRN
Start: 2024-01-24 — End: 2024-01-24

## 2024-01-24 MED ORDER — ALLOPURINOL 100 MG PO TABS
100.0000 mg | ORAL_TABLET | Freq: Every day | ORAL | Status: DC
  Administered 2024-01-24: 100 mg via ORAL
  Filled 2024-01-24: qty 1

## 2024-01-24 MED ORDER — NICOTINE 7 MG/24HR TD PT24
7.0000 mg | MEDICATED_PATCH | Freq: Every day | TRANSDERMAL | Status: DC
Start: 2024-01-24 — End: 2024-01-24
  Filled 2024-01-24: qty 1

## 2024-01-24 MED ORDER — INSULIN GLARGINE-YFGN 100 UNIT/ML ~~LOC~~ SOLN: 12.0000 [IU] | Freq: Every day | SUBCUTANEOUS | Status: DC

## 2024-01-24 MED ORDER — BICTEGRAVIR-EMTRICITAB-TENOFOV 50-200-25 MG PO TABS: 1.0000 | ORAL_TABLET | Freq: Every day | ORAL | Status: DC

## 2024-01-24 MED ORDER — ATORVASTATIN CALCIUM 10 MG PO TABS
10.0000 mg | ORAL_TABLET | Freq: Every day | ORAL | Status: DC
  Administered 2024-01-24: 10 mg via ORAL
  Filled 2024-01-24: qty 1

## 2024-01-24 MED ORDER — MEDIHONEY WOUND/BURN DRESSING EX PSTE: 1.0000 | PASTE | Freq: Every day | CUTANEOUS | Status: DC

## 2024-01-24 MED ORDER — GABAPENTIN 300 MG PO CAPS
300.0000 mg | ORAL_CAPSULE | Freq: Two times a day (BID) | ORAL | Status: DC
  Administered 2024-01-24: 300 mg via ORAL
  Filled 2024-01-24: qty 1

## 2024-01-24 MED ORDER — LIDOCAINE HCL (PF) 1 % IJ SOLN
5.0000 mL | INTRAMUSCULAR | Status: DC | PRN
Start: 2024-01-24 — End: 2024-01-24

## 2024-01-24 MED ORDER — MEDIHONEY WOUND/BURN DRESSING EX PSTE: 1.0000 | PASTE | Freq: Every day | CUTANEOUS | 0 refills | Status: DC

## 2024-01-24 MED ORDER — INSULIN ASPART 100 UNIT/ML IJ SOLN: 5.0000 [IU] | Freq: Three times a day (TID) | INTRAMUSCULAR | Status: DC

## 2024-01-24 MED ORDER — OXYCODONE-ACETAMINOPHEN 5-325 MG PO TABS
1.0000 | ORAL_TABLET | Freq: Three times a day (TID) | ORAL | Status: DC | PRN
  Administered 2024-01-24: 1 via ORAL
  Filled 2024-01-24: qty 1

## 2024-01-24 MED ORDER — LIDOCAINE-PRILOCAINE 2.5-2.5 % EX CREA: 1.0000 | TOPICAL_CREAM | CUTANEOUS | Status: DC | PRN

## 2024-01-24 MED ORDER — PENTAFLUOROPROP-TETRAFLUOROETH EX AERO
1.0000 | INHALATION_SPRAY | CUTANEOUS | Status: DC | PRN
Start: 2024-01-24 — End: 2024-01-24

## 2024-01-24 MED ORDER — LATANOPROST 0.005 % OP SOLN: 1.0000 [drp] | Freq: Every day | OPHTHALMIC | Status: DC

## 2024-01-24 MED ORDER — ALBUTEROL SULFATE (2.5 MG/3ML) 0.083% IN NEBU: 2.5000 mg | INHALATION_SOLUTION | RESPIRATORY_TRACT | Status: DC | PRN

## 2024-01-24 MED ORDER — ALTEPLASE 2 MG IJ SOLR: 2.0000 mg | Freq: Once | INTRAMUSCULAR | Status: DC | PRN

## 2024-01-24 NOTE — Consult Note (Signed)
 Renal Service Consult Note [facility]  Jacob [name] 01/24/2024 Jacob Krabbe, MD Requesting Physician: Dr. Waymon Amato  Reason for Consult: ESRD pt w/ abd pain and SOB HPI: The patient is a 73 y.o. year-old w/ PMH as below who presented to ED yest c/o abd pain and SOB, x 1 day. Had dialysis Monday, UF limited by hypotension. No fevers or chills. In ED CXR showed vasc congsetion and IS edema. Pt was admitted. We are asked to see for dialysis.   Pt seen in HD unit. Just finished a biscuit. Upset because they didn't dialyze him last night, "I could've died down there". I told him the K+ was level and fluid overload wasn't enough to kill him, otherwise they would have dialyzed him last night if it was. No other c/o's.    PMH Anemia Combined diast/ systolic CHF COPD ESRD on HD H/o drug abuse H/o GIB Hepatitis B Glaucoma Gout HTN HIV HL DM2 ICM H/o SDH   ROS - denies CP, no joint pain, no HA, no blurry vision, no rash, no diarrhea, no nausea/ vomiting    Past Surgical History  Past Surgical History:  Procedure Laterality Date   A/V FISTULAGRAM N/A 12/07/2023   Procedure: A/V Fistulagram;  Surgeon: Tyler Pita, MD;  [facility]: [facility] INV[facility] CV LAB;  Service: Cardiovascular;  Laterality: N/A;   AV FISTULA PLACEMENT Left 08/02/2018   Procedure: ARTERIOVENOUS (AV) FISTULA CREATION  left arm radiocephlic;  Surgeon: Cephus Shelling, MD;  Location: [facility] OR;  Service: Vascular;  Laterality: Left;   AV FISTULA PLACEMENT Left 08/01/2019   Procedure: LEFT BRACHIOCEPHALIC ARTERIOVENOUS (AV) FISTULA CREATION;  Surgeon: Larina Earthly, MD;  Location: [facility] OR;  Service: Vascular;  Laterality: Left;   AV FISTULA PLACEMENT Right 04/12/2022   Procedure: RIGHT UPPER EXTREMITY ARTERIOVENOUS (AV)  GRAFT INSERTION USING GORE STRETCH 4-7 MM;  Surgeon: Chuck Hint, MD;  Location: [facility] OR;  Service: Vascular;  Laterality: Right;   BASCILIC VEIN TRANSPOSITION Left 10/03/2019    Procedure: BASILIC VEIN TRANSPOSITION LEFT SECOND STAGE;  Surgeon: Larina Earthly, MD;  Location: [facility] OR;  Service: Vascular;  Laterality: Left;   BIOPSY  01/25/2021   Procedure: BIOPSY;  Surgeon: Sherrilyn Rist, MD;  Location: [facility] ENDOSCOPY;  Service: Gastroenterology;;   CARDIAC CATHETERIZATION  10/2002; 12/19/2004   Jacob [name] 03/08/2011   COLONOSCOPY  2013   Dr.John Marina Goodell    CORONARY ARTERY BYPASS GRAFT  02/24/2003   CABG X2/notes 03/08/2011   CORONARY STENT INTERVENTION N/A 12/19/2019   Procedure: CORONARY STENT INTERVENTION;  Surgeon: Corky Crafts, MD;  [facility]: [facility];  Service: Cardiovascular;  Laterality: N/A;   CORONARY ULTRASOUND/IVUS N/A 12/19/2019   Procedure: Intravascular Ultrasound/IVUS;  Surgeon: Corky Crafts, MD;  Location: [facility] INVASIVE CV LAB;  Service: Cardiovascular;  Laterality: N/A;   ENTEROSCOPY N/A 01/25/2021   Procedure: ENTEROSCOPY;  Surgeon: Sherrilyn Rist, MD;  Location: [facility] ENDOSCOPY;  Service: Gastroenterology;  Laterality: N/A;   ENTEROSCOPY N/A 02/13/2021   Procedure: ENTEROSCOPY;  Surgeon: Lynann Bologna, MD;  Location: [facility] ENDOSCOPY;  Service: Endoscopy;  Laterality: N/A;   ENTEROSCOPY N/A 05/07/2021   Procedure: ENTEROSCOPY;  Surgeon: Benancio Deeds, MD;  Location: [facility] ENDOSCOPY;  Service: Gastroenterology;  Laterality: N/A;   ESOPHAGOGASTRODUODENOSCOPY (EGD) WITH PROPOFOL N/A 02/08/2019   Procedure: ESOPHAGOGASTRODUODENOSCOPY (EGD) WITH PROPOFOL;  Surgeon: Rachael Fee, MD;  Location: [facility] ENDOSCOPY;  Service: Gastroenterology;  Laterality: N/A;   ESOPHAGOGASTRODUODENOSCOPY (EGD) WITH PROPOFOL N/A  12/22/2019   Procedure: ESOPHAGOGASTRODUODENOSCOPY (EGD) WITH PROPOFOL;  Surgeon: Shellia Cleverly, DO;  Location: [facility] ENDOSCOPY;  Service: Gastroenterology;  Laterality: N/A;   ESOPHAGOGASTRODUODENOSCOPY (EGD) WITH PROPOFOL N/A 10/19/2020   Procedure: ESOPHAGOGASTRODUODENOSCOPY (EGD) WITH PROPOFOL;  Surgeon: Lynann Bologna, MD;  Location:  [facility] ENDOSCOPY;  Service: Endoscopy;  Laterality: N/A;   ESOPHAGOGASTRODUODENOSCOPY (EGD) WITH PROPOFOL N/A 12/22/2020   Procedure: ESOPHAGOGASTRODUODENOSCOPY (EGD) WITH PROPOFOL;  Surgeon: Iva Boop, MD;  Location: [facility] ENDOSCOPY;  Service: Endoscopy;  Laterality: N/A;   ESOPHAGOGASTRODUODENOSCOPY (EGD) WITH PROPOFOL N/A 01/09/2021   Procedure: ESOPHAGOGASTRODUODENOSCOPY (EGD) WITH PROPOFOL;  Surgeon: Hilarie Fredrickson, MD;  Location: [facility] ENDOSCOPY;  Service: Endoscopy;  Laterality: N/A;   HEMOSTASIS CLIP PLACEMENT  12/22/2019   Procedure: HEMOSTASIS CLIP PLACEMENT;  Surgeon: Shellia Cleverly, DO;  Location: [facility] ENDOSCOPY;  Service: Gastroenterology;;   HEMOSTASIS CLIP PLACEMENT  12/22/2020   Procedure: HEMOSTASIS CLIP PLACEMENT;  Surgeon: Iva Boop, MD;  Location: [facility] ENDOSCOPY;  Service: Endoscopy;;   HEMOSTASIS CONTROL  12/22/2020   Procedure: HEMOSTASIS CONTROL/hemospray;  Surgeon: Iva Boop, MD;  Location: [facility] ENDOSCOPY;  Service: Endoscopy;;   HOT HEMOSTASIS N/A 02/08/2019   Procedure: HOT HEMOSTASIS (ARGON PLASMA COAGULATION/BICAP);  Surgeon: Rachael Fee, MD;  Location: [facility] ENDOSCOPY;  Service: Gastroenterology;  Laterality: N/A;   HOT HEMOSTASIS N/A 12/22/2019   Procedure: HOT HEMOSTASIS (ARGON PLASMA COAGULATION/BICAP);  Surgeon: Shellia Cleverly, DO;  Location: [facility] ENDOSCOPY;  Service: Gastroenterology;  Laterality: N/A;   HOT HEMOSTASIS N/A 10/19/2020   Procedure: HOT HEMOSTASIS (ARGON PLASMA COAGULATION/BICAP);  Surgeon: Lynann Bologna, MD;  Location: [facility] ENDOSCOPY;  Service: Endoscopy;  Laterality: N/A;   HOT HEMOSTASIS N/A 12/22/2020   Procedure: HOT HEMOSTASIS (ARGON PLASMA COAGULATION/BICAP);  Surgeon: Iva Boop, MD;  Location: [facility] ENDOSCOPY;  Service: Endoscopy;  Laterality: N/A;   HOT HEMOSTASIS N/A 01/09/2021   Procedure: HOT HEMOSTASIS (ARGON PLASMA COAGULATION/BICAP);  Surgeon: Hilarie Fredrickson, MD;  Location: [facility] ENDOSCOPY;  Service: Endoscopy;  Laterality: N/A;   HOT  HEMOSTASIS N/A 01/25/2021   Procedure: HOT HEMOSTASIS (ARGON PLASMA COAGULATION/BICAP);  Surgeon: Sherrilyn Rist, MD;  Location: [facility] ENDOSCOPY;  Service: Gastroenterology;  Laterality: N/A;   HOT HEMOSTASIS N/A 02/13/2021   Procedure: HOT HEMOSTASIS (ARGON PLASMA COAGULATION/BICAP);  Surgeon: Lynann Bologna, MD;  Location: [facility] ENDOSCOPY;  Service: Endoscopy;  Laterality: N/A;   HOT HEMOSTASIS N/A 05/07/2021   Procedure: HOT HEMOSTASIS (ARGON PLASMA COAGULATION/BICAP);  Surgeon: Benancio Deeds, MD;  Location: [facility] ENDOSCOPY;  Service: Gastroenterology;  Laterality: N/A;   INSERTION OF DIALYSIS CATHETER N/A 02/08/2022   Procedure: INSERTION OF TUNNELED DIALYSIS CATHETER;  Surgeon: Chuck Hint, MD;  Location: [facility] OR;  Service: Vascular;  Laterality: N/A;   INTERTROCHANTERIC HIP FRACTURE SURGERY Left 11/2006   Jacob [name] 03/08/2011   IR FLUORO GUIDE CV LINE LEFT  03/14/2022   IR FLUORO GUIDE CV LINE RIGHT  07/24/2019   IR FLUORO GUIDE CV LINE RIGHT  07/30/2019   IR US GUIDE VASC ACCESS RIGHT  07/24/2019   IR US GUIDE VASC ACCESS RIGHT  07/30/2019   LAPAROSCOPIC CHOLECYSTECTOMY  05/2006   LIGATION OF ARTERIOVENOUS  FISTULA Left 02/08/2022   Procedure: LIGATION OF LEFT ARTERIOVENOUS  FISTULA;  Surgeon: Chuck Hint, MD;  Location: [facility] OR;  Service: Vascular;  Laterality: Left;   LIGATION OF COMPETING BRANCHES OF ARTERIOVENOUS FISTULA Left 11/05/2018   Procedure: LIGATION OF COMPETING BRANCHES OF ARTERIOVENOUS FISTULA  LEFT  ARM;  Surgeon: Cephus Shelling, MD;  Location:  [facility] OR;  Service: Vascular;  Laterality: Left;   LUMBAR LAMINECTOMY/DECOMPRESSION MICRODISCECTOMY N/A 02/29/2016   Procedure: Left L4-5 Lateral Recess Decompression, Removal Extradural Intraspinal Facet Cyst;  Surgeon: Eldred Manges, MD;  Location: [facility] OR;  Service: Orthopedics;  Laterality: N/A;   METATARSAL HEAD EXCISION Right 07/20/2022   Procedure: Irragation and debridement of right foot wound with extraction of all  necrotic soft tissue and bone;  Surgeon: Pilar Plate, DPM;  Location: [facility] OR;  Service: Podiatry;  Laterality: Right;  Right 4th Met head and base of proximal phalanx resection   MULTIPLE TOOTH EXTRACTIONS     ORIF MANDIBULAR FRACTURE Left 08/13/2004   ORIF of left body fracture mandible with KLS Martin 2.3-mm six hole/notes 03/08/2011   RIGHT/LEFT HEART CATH AND CORONARY/GRAFT ANGIOGRAPHY N/A 12/19/2019   Procedure: RIGHT/LEFT HEART CATH AND CORONARY/GRAFT ANGIOGRAPHY;  Surgeon: Corky Crafts, MD;  Location: [facility] INVASIVE CV LAB;  Service: Cardiovascular;  Laterality: N/A;   SCLEROTHERAPY  12/22/2019   Procedure: SCLEROTHERAPY;  Surgeon: Shellia Cleverly, DO;  Location: [facility] ENDOSCOPY;  Service: Gastroenterology;;   Susa Day  02/13/2021   Procedure: Susa Day;  Surgeon: Lynann Bologna, MD;  Location: [facility] ENDOSCOPY;  Service: Endoscopy;;   Family History  Family History  Problem Relation Age of Onset   Heart failure Father    Hypertension Father    Diabetes Brother    Heart attack Brother    Alzheimer's disease Mother    Stroke Sister    Diabetes Sister    Alzheimer's disease Sister    Hypertension Brother    Diabetes Brother    Drug abuse Brother    Colon cancer Neg Hx    Social History  reports that he has been smoking cigarettes. He has a 21.5 pack-year smoking history. He has never been exposed to tobacco smoke. He has never used smokeless tobacco. He reports that he does not currently use alcohol after a past usage of about 12.0 standard drinks of alcohol per week. He reports that he does not currently use drugs after having used the following drugs: Cocaine. Allergies  Allergies  Allergen Reactions   Augmentin [Amoxicillin-Pot Clavulanate] Diarrhea and Other (See Comments)    Severe diarrhea   Morphine Sulfate Anaphylaxis    Can't mix with oxycodone    Mucinex Fast-Max Other (See Comments)    Intense sweating    Amphetamines Other (See Comments)     Unknown reaction   Home medications Prior to Admission medications   Medication Sig Start Date End Date Taking? Authorizing Provider  acetaminophen (TYLENOL) 650 MG CR tablet Take 1,300 mg by mouth 3 (three) times daily.    [provider]  albuterol (PROVENTIL) (2.5 MG/3ML) 0.083% nebulizer solution Take 3 mLs (2.5 mg total) by nebulization every 6 (six) hours as needed for wheezing or shortness of breath. 06/20/23 06/19/24  [name], Dinah C, NP  allopurinol (ZYLOPRIM) 100 MG tablet TAKE 1 TABLET BY MOUTH DAILY 11/01/23   [name], MD  atorvastatin (LIPITOR) 10 MG tablet TAKE 1 TABLET(10 MG) BY MOUTH DAILY. 11/23/23   Gaston Islam., NP  BIKTARVY [phone] MG TABS tablet TAKE 1 TABLET BY MOUTH 1 TIME A DAY 01/04/24   Gardiner Barefoot, MD  Calcium Acetate 667 MG TABS Take 667-2,001 mg by mouth See admin instructions. Take 2001 mg by mouth three times a day with meals and 667 mg with each snack 01/20/21   [provider]  carvedilol (COREG) 3.125 MG tablet TAKE 1 TABLET(3.125  MG) BY MOUTH TWICE DAILY WITH A MEAL 11/28/23   Gaston Islam., NP  diclofenac Sodium (VOLTAREN) 1 % GEL Apply 2 grams topically to the affected area three times daily as needed for pain. 12/19/22   [name], MD  folic acid (FOLVITE) 1 MG tablet TAKE 1 TABLET(1 MG) BY MOUTH DAILY 11/27/23   [name], MD  gabapentin (NEURONTIN) 300 MG capsule Take 300 mg by mouth in the morning and at bedtime.    [provider]  HUMALOG KWIKPEN 100 UNIT/ML KwikPen Inject 15 Units into the skin 3 (three) times daily. 01/02/24   [name], MD  insulin degludec (TRESIBA) 100 UNIT/ML FlexTouch Pen Inject 15 Units into the skin daily. 3 01/02/24   [name], MD  isosorbide mononitrate (IMDUR) 30 MG 24 hr tablet Take 1 tablet (30 mg total) by mouth daily. 06/05/23   [name], MD  latanoprost (XALATAN) 0.005 % ophthalmic solution Place 1 drop into both eyes at  bedtime.    [provider]  lidocaine-prilocaine (EMLA) cream Apply topically as directed. At dialysis 10/30/23   [provider]  Menthol-Camphor (TIGER BALM ARTHRITIS RUB) 11-11 % CREA Apply 1 Application topically as needed (pain).    [provider]  midodrine (PROAMATINE) 10 MG tablet Take 10 mg by mouth 3 (three) times a week. 03/21/23   [provider]  multivitamin (RENA-VIT) TABS tablet Take 1 tablet by mouth daily.    [provider]  neomycin-bacitracin-polymyxin [phone] OINT Apply 1 Application topically in the morning and at bedtime. 12/26/23   [name], MD  octreotide (SANDOSTATIN LAR DEPOT) 10 MG injection Inject 10 mg into the muscle every 28 days. 08/22/23   Hilarie Fredrickson, MD  oxyCODONE-acetaminophen (PERCOCET) 5-325 MG tablet Take 1 tablet by mouth every 8 (eight) hours as needed for severe pain (pain score 7-10). 01/11/24   [name], MD  PREVIDENT 5000 PLUS 1.1 % CREA dental cream Place 1 Application onto teeth at bedtime. 05/25/23   [provider]  simethicone (MYLICON) 125 MG chewable tablet Chew 125 mg by mouth as needed for flatulence.    [provider]  timolol (TIMOPTIC) 0.5 % ophthalmic solution Place 1 drop into both eyes in the morning. 04/04/19   [provider]  umeclidinium-vilanterol (ANORO ELLIPTA) 62.5-25 MCG/ACT AEPB Inhale 1 puff into the lungs daily. 06/07/23   [name], MD     Vitals:   01/24/24 0700 01/24/24 0829 01/24/24 0921 01/24/24 0925  BP: (!) 111/59  132/68 132/68  Pulse:   75   Resp: 10  20   Temp:  97.7 F (36.5 C) 97.7 F (36.5 C)   TempSrc:  Oral    SpO2:   100%   Weight:      Height:       Exam Gen alert, no distress, [place] O2 No rash, cyanosis or gangrene Sclera anicteric, throat clear  No jvd or bruits Chest bibasilar soft rales, 1/4 up, no ^wob  RRR no MRG Abd soft ntnd no mass or ascites +bs GU nl male MS no joint effusions  or deformity Ext bilat 1-2+ pretib edema, no other edema Neuro is alert, Ox 3 , nf    RFA AVG+bruit     Renal-related home meds: Phoslo 3 ac tid Midodrine 10 tid Imdur 30 every day Renavite Coreg 3.125 bid Others: anoro ellipta, percocet, insulin, gabapentin, biktarvy, statin, allopurinol    OP HD: [facility] MWF 3h  B400   87.5kg  2K bath  AVG   Heparin 2300 Last HD 3/31, post wt 88kg, comes off 1-2kg over Sensipar 60mg  three times per week Rocaltrol 1.25 three times per week  CXR - IMPRESSION: Cardiomegaly with vascular congestion and mild-to-moderate interstitial edema. K+ 6.1  Assessment/ Plan: Volume overload: w/ pulm edema. May be losing body wt as he has been getting to his dry wt recently. HD this am in progress, max UF as tolerated. Wean down [place] O2 post HD.  Hyperkalemia: mild-moderate severity. HD today.  ESRD: on HD MWF. HD as above.  BP: chronic BP issues on midodrine 10 tid. Consider d/c coreg if BP's drop.  Anemia of esrd: Hb 12, follow.  Secondary hyperparathyroidism: CCa in range, phos a bit high. Cont binders w/ meals COPD - not on home O2 HIV Combined syst/ diast CHF    Rob Jerrit Horen  MD [facility] 01/24/2024, 10:15 AM  Recent Labs  Lab 01/23/24 1931 01/24/24 0414  HGB 12.3*  --   CALCIUM 9.3 9.1  PHOS  --  6.8*  CREATININE 9.16* 9.67*  K 4.6 6.1*   Inpatient medications:  allopurinol  100 mg Oral Daily   atorvastatin  10 mg Oral Daily   bictegravir-emtricitabine-tenofovir AF  1 tablet Oral Daily   Chlorhexidine Gluconate Cloth  6 each Topical Q0600   gabapentin  300 mg Oral BID   insulin aspart  0-6 Units Subcutaneous TID WC   insulin aspart  5 Units Subcutaneous TID WC   insulin glargine-yfgn  12 Units Subcutaneous Daily   latanoprost  1 drop Both Eyes QHS   leptospermum manuka honey  1 Application Topical Daily   nicotine  7 mg Transdermal Daily   sodium chloride flush  3 mL Intravenous Q12H   timolol  1 drop Both Eyes q AM    acetaminophen,  albuterol, alteplase, heparin, lidocaine (PF), lidocaine-prilocaine, melatonin, midodrine, nicotine polacrilex, oxyCODONE-acetaminophen, pentafluoroprop-tetrafluoroeth, polyethylene glycol

## 2024-01-24 NOTE — Procedures (Signed)
 I was present at the procedure, reviewed the HD regimen and made appropriate changes.   Vinson Moselle MD  [facility] 01/24/2024, 12:42 PM

## 2024-01-24 NOTE — Discharge Summary (Signed)
 Physician Discharge Summary  Jacob [name] QMV:[id number] DOB: 01/19/1951  PCP: [name], MD  Admitted from: Home Discharged to: Home  Admit date: 01/23/2024 Discharge date: 01/24/2024  Recommendations for Outpatient Follow-up:    Follow-up Information     [name], MD. Schedule an appointment as soon as possible for a visit in 1 week(s).   Specialty: Internal Medicine Why: To be seen with repeat labs (CBC & CMP).  Kindly get updated 2D echo as outpatient. Contact information: [address] [name] [address] [phone]         [facility] Follow up.   Why: Continue your regular hemodialysis appointments as before and follow-up with your primary kidney doctor. Contact information: [address] [name] [address] [phone]                  Home Health: Home Health Orders (From admission, onward)     Start     Ordered   01/24/24 1727  Home Health  At discharge       Question:  To provide the following care/treatments  Answer:  RN   01/24/24 1728             Equipment/Devices: None    Discharge Condition: Improved and stable   Code Status: Full Code Diet recommendation:  Discharge Diet Orders (From admission, onward)     Start     Ordered   01/24/24 0000  Diet - low sodium heart healthy        01/24/24 1728   01/24/24 0000  Diet Carb Modified        01/24/24 1728             Discharge Diagnoses:  Principal Problem:   Volume overload Active Problems:   Acute on chronic combined systolic and diastolic CHF (congestive heart failure) (HCC)   ESRD (end stage renal disease) (HCC)   Encounter for smoking cessation counseling    Brief Hospital Course:  73 year old male, reportedly lives alone and ambulates with the help of a walker, medical history significant for ESRD on hemodialysis MWF, chronic hypotension and takes midodrine prior to HD, ICM, chronic heart failure with midrange EF,  CAD, COPD, HTN, DM, osteomyelitis requiring amputation, AVM related GI bleeds, HIV, polysubstance abuse, ongoing tobacco use, presented to the ED with complaints of 1 day history of dyspnea on exertion.  He had HD on 3/31 with approximately 1.7 L ultrafiltration limited by hypotension.  Admitted for acute on chronic HF with midrange EF complicating ESRD/HD and volume overload.  Nephrology consulted for dialysis needs.     Assessment & Plan:    Acute on chronic heart failure with midrange EF 2D echo 08/23/2022 showed LVEF of 40-45%.  Plan for updated 2D echo, can be done either inpatient or outpatient test at the Nephrology consulted.  Volume management across HD. HS Troponin not indicative of ACS (64>60), suspected demand ischemia in the context of volume overload. 2D echo was ordered but patient unwilling to wait for this to get done in the hospital, insistent on discharge this evening.  PCP to pursue this updated 2D echo as outpatient. Underwent HD 3.5 hours with 2400 mL ultrafiltration. Postdialysis, per ED RN, saturating at 97 percent on room air.  Hypoxia resolved. Therapies have seen and indicated that he is at his baseline and do not recommend any PT follow-up. Patient's home meds have not been reconciled by pharmacy, some of it possibly due to lack of patient cooperation.  He is on extensive polypharmacy which will need to be closely reconciled by his PCP.  He is on both Percocet and scheduled Tylenol and is advised to avoid taking Tylenol more than 4 g/day.  He is also on gabapentin the dose of which could possibly be reduced given his ESRD and HD.  Patient is on carvedilol and Imdur and if he continues to have issues with hypotension off dialysis days, could consider stopping 1 or both-defer to his outpatient nephrologist.   Mild hyperkalemia Potassium 6.1 on 4/2. Management across hemodialysis   ESRD on MWF HD Nephrology consulted for dialysis needs and underwent HD as noted  above. Discussed with Dr. Arlean Hopping regarding patient's insistence on wanting to DC today.  Since patient has done well postdialysis, he has cleared patient for DC home to keep his outpatient HD schedule 4/4.   Chronic hypotension Patient takes midodrine 3 times a week prior to HD.  Consider management of other antihypertensives as noted above.   Dyslipidemia Continue atorvastatin   Right lower extremity wound WOC RN consultation appreciated and continue wound care per their recommendations. Followed by outpatient wound care center   Thrombocytopenia Follow CBC as outpatient across HD.     Chronic medical problems: CAD with history CABG: Not on aspirin due to history of GI bleeding.  Continue home atorvastatin 10 mg COPD: Albuterol as needed.  No clinical bronchospasm Hypertension: Home regimen Coreg 3.125 mg twice daily, isosorbide mononitrate 30 mg daily.  See above regarding management of these as above. Diabetes type II: Continue prior home regimen of meds with close outpatient follow-up. Neuropathy: Continue home gabapentin-see above. History of OM requiring amputation: Noted History of GI bleed with AVM, prior APC: On monthly Octreotide injection-supposed to get a dose tomorrow. HIV: Continue home Biktarvy-outpatient ID follow-up. Polysubstance use disorder: Since patient counseled. Current smoking/ smoking cessation: Counseled on smoking cessation. Chronic pain: Continue home oxycodone Tylenol 5 mg every 8 hours as needed for severe.  See discussion above. Glaucoma: Continue home gtts Gout: Continue home allopurinol     Body mass index is 25.62 kg/m.    Consultants:   Nephrology   Procedures:   Hemodialysis 4/2   Discharge Instructions  Discharge Instructions     (HEART FAILURE PATIENTS) Call MD:  Anytime you have any of the following symptoms: 1) 3 pound weight gain in 24 hours or 5 pounds in 1 week 2) shortness of breath, with or without a dry hacking cough 3)  swelling in the hands, feet or stomach 4) if you have to sleep on extra pillows at night in order to breathe.   Complete by: As directed    Call MD for:  difficulty breathing, headache or visual disturbances   Complete by: As directed    Call MD for:  extreme fatigue   Complete by: As directed    Call MD for:  persistant dizziness or light-headedness   Complete by: As directed    Call MD for:  persistant nausea and vomiting   Complete by: As directed    Call MD for:  redness, tenderness, or signs of infection (pain, swelling, redness, odor or green/yellow discharge around incision site)   Complete by: As directed    Call MD for:  severe uncontrolled pain   Complete by: As directed    Call MD for:  temperature >100.4   Complete by: As directed    Diet - low sodium heart healthy   Complete by: As directed    Diet  Carb Modified   Complete by: As directed    Discharge instructions   Complete by: As directed    Acetaminophen from all sources not to exceed 4 g/day.  Both your Tylenol and Percocet have acetaminophen in it.   Discharge wound care:   Complete by: As directed    Wound care  Daily      Comments: Cleanse R lower leg wound with Vashe wound cleanser Hart [place] [phone]), do not rinse and allow to air dry.  Apply Medihoney to wound bed daily, cover with dry gauze and cover with silicone foam or ABD pad and Kerlix roll gauze to secure.   Increase activity slowly   Complete by: As directed         Medication List     TAKE these medications    acetaminophen 650 MG CR tablet Commonly known as: TYLENOL Take 1,300 mg by mouth 3 (three) times daily.   albuterol (2.5 MG/3ML) 0.083% nebulizer solution Commonly known as: PROVENTIL Take 3 mLs (2.5 mg total) by nebulization every 6 (six) hours as needed for wheezing or shortness of breath.   allopurinol 100 MG tablet Commonly known as: ZYLOPRIM TAKE 1 TABLET BY MOUTH DAILY   Anoro Ellipta 62.5-25 MCG/ACT Aepb Generic drug:  umeclidinium-vilanterol Inhale 1 puff into the lungs daily.   atorvastatin 10 MG tablet Commonly known as: LIPITOR TAKE 1 TABLET(10 MG) BY MOUTH DAILY.   Biktarvy 50-200-25 MG Tabs tablet Generic drug: bictegravir-emtricitabine-tenofovir AF TAKE 1 TABLET BY MOUTH 1 TIME A DAY   Calcium Acetate 667 MG Tabs Take 667-2,001 mg by mouth See admin instructions. Take 2001 mg by mouth three times a day with meals and 667 mg with each snack   carvedilol 3.125 MG tablet Commonly known as: COREG TAKE 1 TABLET(3.125 MG) BY MOUTH TWICE DAILY WITH A MEAL   clindamycin 150 MG capsule Commonly known as: CLEOCIN Take 150 mg by mouth every 6 (six) hours.   diclofenac Sodium 1 % Gel Commonly known as: VOLTAREN Apply 2 grams topically to the affected area three times daily as needed for pain.   folic acid 1 MG tablet Commonly known as: FOLVITE TAKE 1 TABLET(1 MG) BY MOUTH DAILY   gabapentin 300 MG capsule Commonly known as: NEURONTIN Take 300 mg by mouth in the morning and at bedtime.   HumaLOG KwikPen 100 UNIT/ML KwikPen Generic drug: insulin lispro Inject 15 Units into the skin 3 (three) times daily.   insulin degludec 100 UNIT/ML FlexTouch Pen Commonly known as: TRESIBA Inject 15 Units into the skin daily. 3   isosorbide mononitrate 30 MG 24 hr tablet Commonly known as: IMDUR Take 1 tablet (30 mg total) by mouth daily.   latanoprost 0.005 % ophthalmic solution Commonly known as: XALATAN Place 1 drop into both eyes at bedtime.   leptospermum manuka honey Pste paste Apply 1 Application topically daily. Cleanse R lower leg wound with Vashe wound cleanser Hart [place] [phone]), do not rinse and allow to air dry. Apply Medihoney to wound bed daily, cover with dry gauze and cover with silicone foam or ABD pad and Kerlix roll gauze to secure.Interchangeable with TheraHoney Apply thin layer (3 mm) to wound. Start taking on: January 25, 2024   lidocaine-prilocaine cream Commonly known as:  EMLA Apply topically as directed. At dialysis   midodrine 10 MG tablet Commonly known as: PROAMATINE Take 10 mg by mouth 3 (three) times a week.   multivitamin Tabs tablet Take 1 tablet by mouth daily.   neomycin-bacitracin-polymyxin [phone]  Oint Apply 1 Application topically in the morning and at bedtime.   oxyCODONE-acetaminophen 5-325 MG tablet Commonly known as: Percocet Take 1 tablet by mouth every 8 (eight) hours as needed for severe pain (pain score 7-10).   PreviDent 5000 Plus 1.1 % Crea dental cream Generic drug: sodium fluoride Place 1 Application onto teeth at bedtime.   SandoSTATIN LAR Depot 10 MG injection Generic drug: octreotide Inject 10 mg into the muscle every 28 days.   simethicone 125 MG chewable tablet Commonly known as: MYLICON Chew 125 mg by mouth as needed for flatulence.   Tiger Balm Arthritis Rub 11-11 % Crea Generic drug: Menthol-Camphor Apply 1 Application topically as needed (pain).   timolol 0.5 % ophthalmic solution Commonly known as: TIMOPTIC Place 1 drop into both eyes in the morning.       Allergies  Allergen Reactions   Augmentin [Amoxicillin-Pot Clavulanate] Diarrhea and Other (See Comments)    Severe diarrhea   Morphine Sulfate Anaphylaxis    Can't mix with oxycodone    Mucinex Fast-Max Other (See Comments)    Intense sweating    Amphetamines Other (See Comments)    Unknown reaction      Procedures/Studies: DG Chest Port 1 View Result Date: 01/23/2024 CLINICAL DATA:  Shortness of breath EXAM: PORTABLE CHEST 1 VIEW COMPARISON:  12/27/2023 FINDINGS: Prior CABG. Cardiomegaly with vascular congestion. Diffuse interstitial opacities throughout the lungs, likely edema. No effusions or acute bony abnormality. IMPRESSION: Cardiomegaly with vascular congestion and mild-to-moderate interstitial edema. Electronically Signed   By: Charlett Nose M.D.   On: 01/23/2024 20:37     Subjective: Postdialysis, patient insisting on DC  home.  Per nursing, denies complaints.  Specifically no dyspnea or pain.  Discharge Exam:  Vitals:   01/24/24 0925 01/24/24 1426 01/24/24 1429 01/24/24 1515  BP: 132/68 123/77 131/78 106/81  Pulse:  76 81 77  Resp:  13 17 14   Temp:  97.7 F (36.5 C) 98.6 F (37 C)   TempSrc:      SpO2:  93% 92% 97%  Weight:      Height:        General exam: Elderly male, moderately built and nourished, somewhat chronically ill looking lying comfortably propped up in bed without distress. Respiratory system: Clear to auscultation. Respiratory effort normal.  Was on [place] 2 L/min oxygen.  Currently on room air as per ED RN. Cardiovascular system: S1 & S2 heard, RRR. No JVD, murmurs, rubs, gallops or clicks.  Trace left edema.  Was not on telemetry when I saw him. Gastrointestinal system: Abdomen is nondistended, soft and nontender. No organomegaly or masses felt. Normal bowel sounds heard. Central nervous system: Alert and oriented. No focal neurological deficits. Extremities: Symmetric 5 x 5 power.  Right lower leg dressing clean and dry. Skin: No rashes, lesions or ulcers Psychiatry: Judgement and insight appear normal. Mood & affect appropriate.     The results of significant diagnostics from this hospitalization (including imaging, microbiology, ancillary and laboratory) are listed below for reference.     Microbiology: No results found for this or any previous visit (from the past 240 hours).   Labs: CBC: Recent Labs  Lab 01/23/24 1931  WBC 5.9  NEUTROABS 3.5  HGB 12.3*  HCT 39.0  MCV 108.9*  PLT 124*    Basic Metabolic Panel: Recent Labs  Lab 01/23/24 1931 01/24/24 0414  NA 139 140  K 4.6 6.1*  CL 96* 97*  CO2 27 30  GLUCOSE 179* 128*  BUN 38* 39*  CREATININE 9.16* 9.67*  CALCIUM 9.3 9.1  MG  --  2.6*  PHOS  --  6.8*    Liver Function Tests: No results for input(s): "AST", "ALT", "ALKPHOS", "BILITOT", "PROT", "ALBUMIN" in the last 168 hours.  CBG: Recent Labs   Lab 01/24/24 0751 01/24/24 1421  GLUCAP 128* 153*      Time coordinating discharge: 35 minutes  SIGNED:  Marcellus Scott, MD,  FACP, [facility], [facility], [facility]   [facility] Phys[facility] Advisor [place]     To contact the attending provider between 7A-7P or the covering provider during after hours 7P-7A, please log into the web site [email or web address] and access using universal [place] password for that web site. If you do not have the password, please call the hospital operator.

## 2024-01-24 NOTE — Evaluation (Signed)
 Physical Therapy Brief Evaluation and Discharge Note Patient Details Name: Jacob [name] MRN: [id number] DOB: 1951/10/09 Today's Date: 01/24/2024   History of Present Illness  Pt is 73 year old presented to [facility] on  01/23/24 for volume overload. PMH - ESRD on HD, HIV, DM2 with neuropathy, HTN, and chronic diabetic ulcer of R foot.  Clinical Impression  Pt at baseline with mobility and able to ambulate with rollator. Ready for dc home from PT standpoint.        PT Assessment Patient does not need any further PT services  Assistance Needed at Discharge  PRN    Equipment Recommendations None recommended by PT  Recommendations for Other Services       Precautions/Restrictions Precautions Precautions: Fall Recall of Precautions/Restrictions: Intact Restrictions Weight Bearing Restrictions Per Provider Order: No        Mobility  Bed Mobility       General bed mobility comments: Pt sitting up on edge of stretcher  Transfers Overall transfer level: Modified independent Equipment used: Rollator (4 wheels)               General transfer comment: Incr time    Ambulation/Gait Ambulation/Gait assistance: Supervision Gait Distance (Feet): 125 Feet Assistive device: Rollator (4 wheels) Gait Pattern/deviations: Step-through pattern, Decreased step length - left, Decreased step length - right, Decreased stride length, Trunk flexed Gait Speed: Below normal General Gait Details: Assist for safety  Home Activity Instructions    Stairs            Modified Rankin (Stroke Patients Only)        Balance Overall balance assessment: Needs assistance Sitting-balance support: No upper extremity supported, Feet supported Sitting balance-Leahy Scale: Good     Standing balance support: No upper extremity supported, During functional activity Standing balance-Leahy Scale: Fair            Pertinent Vitals/Pain   Pain Assessment Pain Assessment: No/denies pain      Home Living Family/patient expects to be discharged to:: Private residence Living Arrangements: Alone Available Help at Discharge: Friend(s);Available PRN/intermittently Home Environment: Level entry   Home Equipment: Rollator (4 wheels);Grab bars - tub/shower;Grab bars - toilet;Shower seat;Hand held Engineer, civil (consulting) - single point   Additional Comments: Lives in handicapped accessible senior apartments    Prior Function Level of Independence: Needs assistance Comments: Amb with rollator. SCAT to HD. Assist for groceries.    UE/LE Assessment   UE ROM/Strength/Tone/Coordination: WFL    LE ROM/Strength/Tone/Coordination: Generalized weakness      Communication   Communication Communication: No apparent difficulties     Cognition Overall Cognitive Status: Appears within functional limits for tasks assessed/performed       General Comments General comments (skin integrity, edema, etc.): VSS on RA    Exercises     Assessment/Plan    PT Problem List         PT Visit Diagnosis Muscle weakness (generalized) (M62.81)    No Skilled PT Patient at baseline level of functioning   Co-evaluation                AMPAC 6 Clicks Help needed turning from your back to your side while in a flat bed without using bedrails?: None Help needed moving from lying on your back to sitting on the side of a flat bed without using bedrails?: None Help needed moving to and from a bed to a chair (including a wheelchair)?: None Help needed standing up from a chair using your arms (  e.g., wheelchair or bedside chair)?: None Help needed to walk in hospital room?: A Little Help needed climbing 3-5 steps with a railing? : A Little 6 Click Score: 22      End of Session   Activity Tolerance: Patient tolerated treatment well Patient left: in bed;with call bell/phone within reach Nurse Communication: Mobility status PT Visit Diagnosis: Muscle weakness (generalized) (M62.81)      Time: 1610-9604 PT Time Calculation (min) (ACUTE ONLY): 13 min  Charges:   PT Evaluation $PT Eval Low Complexity: 1 Low      [facility] PT Acute Rehabilitation Services Office [phone]   Angelina Ok [facility]  01/24/2024, 4:16 PM

## 2024-01-24 NOTE — Progress Notes (Signed)
 Heart Failure Navigator Progress Note  Assessed for Heart & Vascular TOC clinic readiness.  Patient does not meet criteria due to ESRD on hemodialysis.   Navigator will sign off at this time.   Rhae Hammock, BSN, Scientist, clinical (histocompatibility and immunogenetics) Only

## 2024-01-24 NOTE — Progress Notes (Addendum)
 Received patient in bed to unit.  Alert and oriented x 4 Informed consent signed and in chart.    TX duration: 3.5 hours   Patient tolerated well with exception of cartridge clotting   Transported back to ED via stretcher Alert, without acute distress.  Hand-off given to patient's nurse. Recca in ED    Access used: RAVG Access issues: Clotting of the cartridge causing Blood flow to be decreased   Total UF removed: 2400 Medication(s) given: Oxycodone administered Post HD weight: unable to obtain due to stretcher Post HD VS: 131/78, 98.7 Temp, 81 pulse 17 Resp   Louis Ivery S [place] Zapien RN Kidney Dialysis Unit

## 2024-01-24 NOTE — Progress Notes (Signed)
 PROGRESS NOTE   Jacob [name]  WUJ:[id number]    DOB: July 16, 1951    DOA: 01/23/2024  PCP: [name], MD   I have briefly reviewed patients previous medical records in [facility].  Chief Complaint  Patient presents with   Shortness of Breath    Brief Hospital Course:  73 year old male, reportedly lives alone and ambulates with the help of a walker, medical history significant for ESRD on hemodialysis MWF, chronic hypotension and takes midodrine prior to HD, ICM, chronic heart failure with midrange EF, CAD, COPD, HTN, DM, osteomyelitis requiring amputation, AVM related GI bleeds, HIV, polysubstance abuse, ongoing tobacco use, presented to the ED with complaints of 1 day history of dyspnea on exertion.  He had HD on 3/31 with approximately 1.7 L ultrafiltration limited by hypotension.  Admitted for acute on chronic HF with midrange EF complicating ESRD/HD and volume overload.  Nephrology consulted for dialysis needs.   Assessment & Plan:  Principal Problem:   Volume overload Active Problems:   Acute on chronic combined systolic and diastolic CHF (congestive heart failure) (HCC)   ESRD (end stage renal disease) (HCC)   Encounter for smoking cessation counseling   Acute on chronic heart failure with midrange EF 2D echo 08/23/2022 showed LVEF of 40-45%.  Plan for updated 2D echo, can be done either inpatient or outpatient Nephrology consulted.  Volume management across HD. HS Troponin not indicative of ACS (64>60), suspected demand ischemia in the context of volume overload.  Mild hyperkalemia Potassium 6.1 on 4/2. Management across hemodialysis  ESRD on MWF HD Nephrology consulted for dialysis needs  Chronic hypotension Do not see midodrine on current med list, review and reinitiate.  Dyslipidemia Continue atorvastatin  Right lower extremity wound WOC RN consultation appreciated and continue wound care per their recommendations. Followed by outpatient wound  care center  Thrombocytopenia Follow CBC in AM.   Chronic medical problems: CAD with history CABG: Not on aspirin due to history of GI bleeding.  Continue home atorvastatin 10 mg COPD: Albuterol as needed.  No clinical bronchospasm Hypertension: Home regimen Coreg 3.125 mg twice daily, isosorbide mononitrate 30 mg daily.  Consider stopping his nitrate if he is not dealing with chronic angina in the setting of hypotension limiting HD UF.  Held both for now. Diabetes type II: Home regimen Tresiba 15 units daily, Humalog 15 units 3 times daily.  For now slight reduction to Semglee 12 units daily, aspart 5 units 3 times daily with meals with SSI for very sensitive. Neuropathy: Continue home gabapentin History of OM requiring amputation: Noted History of GI bleed with AVM, prior APC: On monthly Octreotide injection HIV: Continue home Biktarvy Polysubstance use disorder: Check tox screen Current smoking/ smoking cessation: Counseled on smoking cessation.  Nicotine patch and gum as needed.  Rx at discharge. Chronic pain: Continue home oxycodone Tylenol 5 mg every 8 hours as needed for severe. Glaucoma: Continue home gtts Gout: Continue home allopurinol   Body mass index is 25.62 kg/m.   DVT prophylaxis: SCDs Start: 01/23/24 2344     Code Status: Full Code:  Family Communication: None at bedside Disposition:  Status is: Observation The patient remains OBS appropriate and will d/c before 2 midnights.  DC home pending updated echo, PT evaluation and nephrology clearance.     Consultants:   Nephrology  Procedures:   Hemodialysis 4/2  Antimicrobials:   None   Subjective:  Seen this morning while still in the ED and was getting ready to be wheeled  up for hemodialysis.  Patient reluctant and poor historian.  Was busy on his phone calling someone.  Despite waiting patiently for some time, patient with single word answers.  Denied complaints.  Specifically denied dyspnea, chest pain or  pain elsewhere.  Did indicate that he had hemodialysis on Monday.  Objective:   Vitals:   01/24/24 0700 01/24/24 0829 01/24/24 0921 01/24/24 0925  BP: (!) 111/59  132/68 132/68  Pulse:   75   Resp: 10  20   Temp:  97.7 F (36.5 C) 97.7 F (36.5 C)   TempSrc:  Oral    SpO2:   100%   Weight:      Height:        General exam: Elderly male, moderately built and nourished, somewhat chronically ill looking lying comfortably propped up in bed without distress. Respiratory system: Clear to auscultation. Respiratory effort normal.  Was on [place] 2 L/min oxygen. Cardiovascular system: S1 & S2 heard, RRR. No JVD, murmurs, rubs, gallops or clicks.  Trace left edema.  Was not on telemetry when I saw him. Gastrointestinal system: Abdomen is nondistended, soft and nontender. No organomegaly or masses felt. Normal bowel sounds heard. Central nervous system: Alert and oriented. No focal neurological deficits. Extremities: Symmetric 5 x 5 power.  Right lower leg dressing clean and dry. Skin: No rashes, lesions or ulcers Psychiatry: Judgement and insight appear normal. Mood & affect appropriate.     Data Reviewed:   I have personally reviewed following labs and imaging studies   CBC: Recent Labs  Lab 01/23/24 1931  WBC 5.9  NEUTROABS 3.5  HGB 12.3*  HCT 39.0  MCV 108.9*  PLT 124*    Basic Metabolic Panel: Recent Labs  Lab 01/23/24 1931 01/24/24 0414  NA 139 140  K 4.6 6.1*  CL 96* 97*  CO2 27 30  GLUCOSE 179* 128*  BUN 38* 39*  CREATININE 9.16* 9.67*  CALCIUM 9.3 9.1  MG  --  2.6*  PHOS  --  6.8*    Liver Function Tests: No results for input(s): "AST", "ALT", "ALKPHOS", "BILITOT", "PROT", "ALBUMIN" in the last 168 hours.  CBG: Recent Labs  Lab 01/24/24 0751  GLUCAP 128*    Microbiology Studies:  No results found for this or any previous visit (from the past 240 hours).  Radiology Studies:  DG Chest Port 1 View Result Date: 01/23/2024 CLINICAL DATA:  Shortness of  breath EXAM: PORTABLE CHEST 1 VIEW COMPARISON:  12/27/2023 FINDINGS: Prior CABG. Cardiomegaly with vascular congestion. Diffuse interstitial opacities throughout the lungs, likely edema. No effusions or acute bony abnormality. IMPRESSION: Cardiomegaly with vascular congestion and mild-to-moderate interstitial edema. Electronically Signed   By: Charlett Nose M.D.   On: 01/23/2024 20:37    Scheduled Meds:    allopurinol  100 mg Oral Daily   atorvastatin  10 mg Oral Daily   bictegravir-emtricitabine-tenofovir AF  1 tablet Oral Daily   Chlorhexidine Gluconate Cloth  6 each Topical Q0600   gabapentin  300 mg Oral BID   insulin aspart  0-6 Units Subcutaneous TID WC   insulin aspart  5 Units Subcutaneous TID WC   insulin glargine-yfgn  12 Units Subcutaneous Daily   latanoprost  1 drop Both Eyes QHS   leptospermum manuka honey  1 Application Topical Daily   nicotine  7 mg Transdermal Daily   sodium chloride flush  3 mL Intravenous Q12H   timolol  1 drop Both Eyes q AM    Continuous Infusions:  LOS: 0 days     Marcellus Scott, MD,  FACP, [facility], [facility], [facility]   [facility] [place]      To contact the attending provider between 7A-7P or the covering provider during after hours 7P-7A, please log into the web site [email or web address] and access using universal [place] password for that web site. If you do not have the password, please call the hospital operator.  01/24/2024, 2:02 PM

## 2024-01-24 NOTE — ED Notes (Signed)
 Pt admitted to [facility]

## 2024-01-24 NOTE — H&P (Incomplete)
 History and Physical    Patient: Jacob [name] ZOX:[id number] DOB: 1951/05/20 DOA: 01/23/2024 DOS: the patient was seen and examined on 01/24/2024 PCP: [name], MD  Patient coming from: {Point_of_Origin:26777}  Chief Complaint:  Chief Complaint  Patient presents with  . Shortness of Breath   HPI: Jacob [name] is a 73 y.o. male with medical history significant of ***  Review of Systems: {ROS_Text:26778} Past Medical History:  Diagnosis Date  . Acute respiratory failure (HCC) 03/01/2018  . Anemia   . Arthritis    "all over; mostly knees and back" (02/28/2018)  . Chronic combined systolic and diastolic CHF (congestive heart failure) (HCC)   . Chronic lower back pain    stenosis  . Community acquired pneumonia 09/06/2013  . COPD (chronic obstructive pulmonary disease) (HCC)   . Coronary atherosclerosis of native coronary artery    a. 02/2003 s/p CABG x 2 (VG->RI, VG->RPDA; b. 11/2019 PCI: LM nl, LAD 90d, D3 50, RI 100, LCX 100p, OM3 100 - fills via L->L collats from D2/dLAD, RCA 100p, VG->RPDA ok, VG->RI 95 (3.5x48 Synergy XD DES).  . Drug abuse (HCC)    hx; tested for cocaine as recently as 2/08. says he is not using drugs now - avoided defib. for this reason   . ESRD (end stage renal disease) (HCC)    Hemo M-W-F- [facility] Merino  . Fall at home 10/2020  . GERD (gastroesophageal reflux disease)    takes OTC meds as needed  . GI bleeding    a. 11/2019 EGD: angiodysplastic lesions w/ bleeding s/p argon plasma/clipping/epi inj. Multiple admissions for the same.  . Glaucoma    uses eye drops daily  . Hepatitis B 1968   "tx'd w/isolation; caught it from toilet stools in gym"  . History of blood transfusion 03/01/2019  . History of colon polyps    benign  . History of gout    takes Allopurinol daily as well as Colchicine-if needed (02/28/2018)  . History of kidney stones   . HTN (hypertension)    takes Coreg,Imdur.and Apresoline daily  . Human immunodeficiency virus (HIV)  disease (HCC) dx'd 73   on Biktarvy as of 12/2020.    [name] Hyperlipidemia   . Ischemic cardiomyopathy    a. 01/2019 Echo: EF 40-45%, diffuse HK, mild basal septal hypertrophy. Diast dysfxn. Nl RV size/fxn. Sev dil LA. Triv MR/TR/PR.  [name] Muscle spasm    takes Zanaflex as needed  . Myocardial infarction (HCC) ~ 2004/2005  . Nocturia   . Peripheral neuropathy    takes gabapentin daily  . Pneumonia    "at least twice" (02/28/2018)  . SDH (subdural hematoma) (HCC)   . Syphilis, unspecified   . Type II diabetes mellitus (HCC) 2004   Lantus daily.Average fasting blood sugar 125-199  . Wears glasses   . Wears partial dentures    Past Surgical History:  Procedure Laterality Date  . A/V FISTULAGRAM N/A 12/07/2023   Procedure: A/V Fistulagram;  Surgeon: Tyler Pita, MD;  Location: [facility] INVASIVE CV LAB;  Service: Cardiovascular;  Laterality: N/A;  . AV FISTULA PLACEMENT Left 08/02/2018   Procedure: ARTERIOVENOUS (AV) FISTULA CREATION  left arm radiocephlic;  Surgeon: Cephus Shelling, MD;  Location: [facility] OR;  Service: Vascular;  Laterality: Left;  . AV FISTULA PLACEMENT Left 08/01/2019   Procedure: LEFT BRACHIOCEPHALIC ARTERIOVENOUS (AV) FISTULA CREATION;  Surgeon: Larina Earthly, MD;  Location: [facility] OR;  Service: Vascular;  Laterality: Left;  . AV FISTULA PLACEMENT Right 04/12/2022  Procedure: RIGHT UPPER EXTREMITY ARTERIOVENOUS (AV)  GRAFT INSERTION USING GORE STRETCH 4-7 MM;  Surgeon: Chuck Hint, MD;  Location: [facility] OR;  Service: Vascular;  Laterality: Right;  . BASCILIC VEIN TRANSPOSITION Left 10/03/2019   Procedure: BASILIC VEIN TRANSPOSITION LEFT SECOND STAGE;  Surgeon: Larina Earthly, MD;  Location: [facility] OR;  Service: Vascular;  Laterality: Left;  . BIOPSY  01/25/2021   Procedure: BIOPSY;  Surgeon: Sherrilyn Rist, MD;  Location: [facility] ENDOSCOPY;  Service: Gastroenterology;;  . CARDIAC CATHETERIZATION  10/2002; 12/19/2004   Hattie Perch 03/08/2011  . COLONOSCOPY  2013   Dr.John Marina Goodell   .  CORONARY ARTERY BYPASS GRAFT  02/24/2003   CABG X2/notes 03/08/2011  . CORONARY STENT INTERVENTION N/A 12/19/2019   Procedure: CORONARY STENT INTERVENTION;  Surgeon: Corky Crafts, MD;  Location: [facility] INVASIVE CV LAB;  Service: Cardiovascular;  Laterality: N/A;  . CORONARY ULTRASOUND/IVUS N/A 12/19/2019   Procedure: Intravascular Ultrasound/IVUS;  Surgeon: Corky Crafts, MD;  Location: [facility] INVASIVE CV LAB;  Service: Cardiovascular;  Laterality: N/A;  . ENTEROSCOPY N/A 01/25/2021   Procedure: ENTEROSCOPY;  Surgeon: Sherrilyn Rist, MD;  Location: [facility] ENDOSCOPY;  Service: Gastroenterology;  Laterality: N/A;  . ENTEROSCOPY N/A 02/13/2021   Procedure: ENTEROSCOPY;  Surgeon: Lynann Bologna, MD;  Location: [facility] ENDOSCOPY;  Service: Endoscopy;  Laterality: N/A;  . ENTEROSCOPY N/A 05/07/2021   Procedure: ENTEROSCOPY;  Surgeon: Benancio Deeds, MD;  Location: [facility] ENDOSCOPY;  Service: Gastroenterology;  Laterality: N/A;  . ESOPHAGOGASTRODUODENOSCOPY (EGD) WITH PROPOFOL N/A 02/08/2019   Procedure: ESOPHAGOGASTRODUODENOSCOPY (EGD) WITH PROPOFOL;  Surgeon: Rachael Fee, MD;  Location: [facility] ENDOSCOPY;  Service: Gastroenterology;  Laterality: N/A;  . ESOPHAGOGASTRODUODENOSCOPY (EGD) WITH PROPOFOL N/A 12/22/2019   Procedure: ESOPHAGOGASTRODUODENOSCOPY (EGD) WITH PROPOFOL;  Surgeon: Shellia Cleverly, DO;  Location: [facility] ENDOSCOPY;  Service: Gastroenterology;  Laterality: N/A;  . ESOPHAGOGASTRODUODENOSCOPY (EGD) WITH PROPOFOL N/A 10/19/2020   Procedure: ESOPHAGOGASTRODUODENOSCOPY (EGD) WITH PROPOFOL;  Surgeon: Lynann Bologna, MD;  Location: [facility] ENDOSCOPY;  Service: Endoscopy;  Laterality: N/A;  . ESOPHAGOGASTRODUODENOSCOPY (EGD) WITH PROPOFOL N/A 12/22/2020   Procedure: ESOPHAGOGASTRODUODENOSCOPY (EGD) WITH PROPOFOL;  Surgeon: Iva Boop, MD;  Location: [facility] ENDOSCOPY;  Service: Endoscopy;  Laterality: N/A;  . ESOPHAGOGASTRODUODENOSCOPY (EGD) WITH PROPOFOL N/A 01/09/2021   Procedure:  ESOPHAGOGASTRODUODENOSCOPY (EGD) WITH PROPOFOL;  Surgeon: Hilarie Fredrickson, MD;  Location: [facility] ENDOSCOPY;  Service: Endoscopy;  Laterality: N/A;  . HEMOSTASIS CLIP PLACEMENT  12/22/2019   Procedure: HEMOSTASIS CLIP PLACEMENT;  Surgeon: Shellia Cleverly, DO;  Location: [facility] ENDOSCOPY;  Service: Gastroenterology;;  . HEMOSTASIS CLIP PLACEMENT  12/22/2020   Procedure: HEMOSTASIS CLIP PLACEMENT;  Surgeon: Iva Boop, MD;  Location: [facility] ENDOSCOPY;  Service: Endoscopy;;  . HEMOSTASIS CONTROL  12/22/2020   Procedure: HEMOSTASIS CONTROL/hemospray;  Surgeon: Iva Boop, MD;  Location: [facility] ENDOSCOPY;  Service: Endoscopy;;  . HOT HEMOSTASIS N/A 02/08/2019   Procedure: HOT HEMOSTASIS (ARGON PLASMA COAGULATION/BICAP);  Surgeon: Rachael Fee, MD;  Location: [facility] ENDOSCOPY;  Service: Gastroenterology;  Laterality: N/A;  . HOT HEMOSTASIS N/A 12/22/2019   Procedure: HOT HEMOSTASIS (ARGON PLASMA COAGULATION/BICAP);  Surgeon: Shellia Cleverly, DO;  Location: [facility] ENDOSCOPY;  Service: Gastroenterology;  Laterality: N/A;  . HOT HEMOSTASIS N/A 10/19/2020   Procedure: HOT HEMOSTASIS (ARGON PLASMA COAGULATION/BICAP);  Surgeon: Lynann Bologna, MD;  Location: [facility] ENDOSCOPY;  Service: Endoscopy;  Laterality: N/A;  . HOT HEMOSTASIS N/A 12/22/2020   Procedure: HOT HEMOSTASIS (ARGON PLASMA COAGULATION/BICAP);  Surgeon: Iva Boop, MD;  Location: [facility] ENDOSCOPY;  Service: Endoscopy;  Laterality: N/A;  . HOT HEMOSTASIS N/A 01/09/2021   Procedure: HOT HEMOSTASIS (ARGON PLASMA COAGULATION/BICAP);  Surgeon: Hilarie Fredrickson, MD;  Location: [facility] ENDOSCOPY;  Service: Endoscopy;  Laterality: N/A;  . HOT HEMOSTASIS N/A 01/25/2021   Procedure: HOT HEMOSTASIS (ARGON PLASMA COAGULATION/BICAP);  Surgeon: Sherrilyn Rist, MD;  Location: [facility] ENDOSCOPY;  Service: Gastroenterology;  Laterality: N/A;  . HOT HEMOSTASIS N/A 02/13/2021   Procedure: HOT HEMOSTASIS (ARGON PLASMA COAGULATION/BICAP);  Surgeon: Lynann Bologna, MD;  Location: [facility] ENDOSCOPY;   Service: Endoscopy;  Laterality: N/A;  . HOT HEMOSTASIS N/A 05/07/2021   Procedure: HOT HEMOSTASIS (ARGON PLASMA COAGULATION/BICAP);  Surgeon: Benancio Deeds, MD;  Location: [facility] ENDOSCOPY;  Service: Gastroenterology;  Laterality: N/A;  . INSERTION OF DIALYSIS CATHETER N/A 02/08/2022   Procedure: INSERTION OF TUNNELED DIALYSIS CATHETER;  Surgeon: Chuck Hint, MD;  Location: [facility] OR;  Service: Vascular;  Laterality: N/A;  . INTERTROCHANTERIC HIP FRACTURE SURGERY Left 11/2006   Hattie Perch 03/08/2011  . IR FLUORO GUIDE CV LINE LEFT  03/14/2022  . IR FLUORO GUIDE CV LINE RIGHT  07/24/2019  . IR FLUORO GUIDE CV LINE RIGHT  07/30/2019  . IR US GUIDE VASC ACCESS RIGHT  07/24/2019  . IR US GUIDE VASC ACCESS RIGHT  07/30/2019  . LAPAROSCOPIC CHOLECYSTECTOMY  05/2006  . LIGATION OF ARTERIOVENOUS  FISTULA Left 02/08/2022   Procedure: LIGATION OF LEFT ARTERIOVENOUS  FISTULA;  Surgeon: Chuck Hint, MD;  Location: [facility] OR;  Service: Vascular;  Laterality: Left;  . LIGATION OF COMPETING BRANCHES OF ARTERIOVENOUS FISTULA Left 11/05/2018   Procedure: LIGATION OF COMPETING BRANCHES OF ARTERIOVENOUS FISTULA  LEFT  ARM;  Surgeon: Cephus Shelling, MD;  Location: [facility] OR;  Service: Vascular;  Laterality: Left;  . LUMBAR LAMINECTOMY/DECOMPRESSION MICRODISCECTOMY N/A 02/29/2016   Procedure: Left L4-5 Lateral Recess Decompression, Removal Extradural Intraspinal Facet Cyst;  Surgeon: Eldred Manges, MD;  Location: [facility] OR;  Service: Orthopedics;  Laterality: N/A;  . METATARSAL HEAD EXCISION Right 07/20/2022   Procedure: Irragation and debridement of right foot wound with extraction of all necrotic soft tissue and bone;  Surgeon: Pilar Plate, DPM;  Location: [facility] OR;  Service: Podiatry;  Laterality: Right;  Right 4th Met head and base of proximal phalanx resection  . MULTIPLE TOOTH EXTRACTIONS    . ORIF MANDIBULAR FRACTURE Left 08/13/2004   ORIF of left body fracture mandible with KLS Martin 2.3-mm six  hole/notes 03/08/2011  . RIGHT/LEFT HEART CATH AND CORONARY/GRAFT ANGIOGRAPHY N/A 12/19/2019   Procedure: RIGHT/LEFT HEART CATH AND CORONARY/GRAFT ANGIOGRAPHY;  Surgeon: Corky Crafts, MD;  Location: [facility] INVASIVE CV LAB;  Service: Cardiovascular;  Laterality: N/A;  . SCLEROTHERAPY  12/22/2019   Procedure: SCLEROTHERAPY;  Surgeon: Shellia Cleverly, DO;  Location: [facility] ENDOSCOPY;  Service: Gastroenterology;;  . Susa Day  02/13/2021   Procedure: SCLEROTHERAPY;  Surgeon: Lynann Bologna, MD;  Location: [facility] ENDOSCOPY;  Service: Endoscopy;;   Social History:  reports that he has been smoking cigarettes. He has a 21.5 pack-year smoking history. He has never been exposed to tobacco smoke. He has never used smokeless tobacco. He reports that he does not currently use alcohol after a past usage of about 12.0 standard drinks of alcohol per week. He reports that he does not currently use drugs after having used the following drugs: Cocaine.  Allergies  Allergen Reactions  . Augmentin [Amoxicillin-Pot Clavulanate] Diarrhea and Other (See Comments)    Severe diarrhea  . Morphine Sulfate Anaphylaxis    Can't  mix with oxycodone   . Mucinex Fast-Max Other (See Comments)    Intense sweating   . Amphetamines Other (See Comments)    Unknown reaction    Family History  Problem Relation Age of Onset  . Heart failure Father   . Hypertension Father   . Diabetes Brother   . Heart attack Brother   . Alzheimer's disease Mother   . Stroke Sister   . Diabetes Sister   . Alzheimer's disease Sister   . Hypertension Brother   . Diabetes Brother   . Drug abuse Brother   . Colon cancer Neg Hx     Prior to Admission medications   Medication Sig Start Date End Date Taking? Authorizing Provider  acetaminophen (TYLENOL) 650 MG CR tablet Take 1,300 mg by mouth 3 (three) times daily.    [provider]  albuterol (PROVENTIL) (2.5 MG/3ML) 0.083% nebulizer solution Take 3 mLs (2.5 mg total) by  nebulization every 6 (six) hours as needed for wheezing or shortness of breath. 06/20/23 06/19/24  [name], Dinah C, NP  allopurinol (ZYLOPRIM) 100 MG tablet TAKE 1 TABLET BY MOUTH DAILY 11/01/23   [name], MD  atorvastatin (LIPITOR) 10 MG tablet TAKE 1 TABLET(10 MG) BY MOUTH DAILY. 11/23/23   Gaston Islam., NP  B-D UF III MINI PEN NEEDLES 31G X 5 MM MISC USE 4 TIMES DAILY 07/13/23   [name], MD  BIKTARVY 50-200-25 MG TABS tablet TAKE 1 TABLET BY MOUTH 1 TIME A DAY 01/04/24   Gardiner Barefoot, MD  Calcium Acetate 667 MG TABS Take 667-2,001 mg by mouth See admin instructions. Take 2001 mg by mouth three times a day with meals and 667 mg with each snack 01/20/21   [provider]  carvedilol (COREG) 3.125 MG tablet TAKE 1 TABLET(3.125 MG) BY MOUTH TWICE DAILY WITH A MEAL 11/28/23   Gaston Islam., NP  diclofenac Sodium (VOLTAREN) 1 % GEL Apply 2 grams topically to the affected area three times daily as needed for pain. 12/19/22   [name], MD  DOXYCYCLINE HYCLATE PO Take 100 mg by mouth in the morning and at bedtime.    [provider]  folic acid (FOLVITE) 1 MG tablet TAKE 1 TABLET(1 MG) BY MOUTH DAILY 11/27/23   [name], MD  gabapentin (NEURONTIN) 300 MG capsule Take 300 mg by mouth in the morning and at bedtime.    [provider]  glucose blood (ONETOUCH ULTRA) test strip Check blood sugar three times daily E11.40 05/18/23   [name], MD  HUMALOG KWIKPEN 100 UNIT/ML KwikPen Inject 15 Units into the skin 3 (three) times daily. 01/02/24   [name], MD  insulin degludec (TRESIBA) 100 UNIT/ML FlexTouch Pen Inject 15 Units into the skin daily. 3 01/02/24   [name], MD  isosorbide mononitrate (IMDUR) 30 MG 24 hr tablet Take 1 tablet (30 mg total) by mouth daily. 06/05/23   [name], MD  Lancets [facility] DELICA PLUS [place]) MISC USE 3 TIMES DAILY 08/15/22   [name], MD   latanoprost (XALATAN) 0.005 % ophthalmic solution Place 1 drop into both eyes at bedtime.    [provider]  lidocaine-prilocaine (EMLA) cream Apply topically as directed. At dialysis 10/30/23   [provider]  Menthol-Camphor (TIGER BALM ARTHRITIS RUB) 11-11 % CREA Apply 1 Application topically as needed (pain).    [provider]  Methoxy PEG-Epoetin Beta (MIRCERA IJ) as directed. At Dialysis 11/08/23 11/06/24  [provider]  midodrine (PROAMATINE) 10 MG tablet Take 10 mg by mouth 3 (three) times a week. 03/21/23   [provider]  multivitamin (RENA-VIT) TABS tablet Take 1 tablet by mouth daily.    [provider]  neomycin-bacitracin-polymyxin 3.5-240 145 9565 OINT Apply 1 Application topically in the morning and at bedtime. 12/26/23   [name], MD  octreotide (SANDOSTATIN LAR DEPOT) 10 MG injection Inject 10 mg into the muscle every 28 days. 08/22/23   Hilarie Fredrickson, MD  oxyCODONE-acetaminophen (PERCOCET) 5-325 MG tablet Take 1 tablet by mouth every 8 (eight) hours as needed for severe pain (pain score 7-10). 01/11/24   [name], MD  PREVIDENT 5000 PLUS 1.1 % CREA dental cream Place 1 Application onto teeth at bedtime. 05/25/23   [provider]  simethicone (MYLICON) 125 MG chewable tablet Chew 125 mg by mouth as needed for flatulence.    [provider]  timolol (TIMOPTIC) 0.5 % ophthalmic solution Place 1 drop into both eyes in the morning. 04/04/19   [provider]  umeclidinium-vilanterol (ANORO ELLIPTA) 62.5-25 MCG/ACT AEPB Inhale 1 puff into the lungs daily. 06/07/23   [name], MD    Physical Exam: Vitals:   01/23/24 2215 01/23/24 2230 01/23/24 2245 01/23/24 2315  BP: 116/63 131/72 (!) 112/55 104/73  Pulse:   76   Resp: 17 20 20 13   Temp:      TempSrc:      SpO2:   95%   Weight:      Height:       *** Data Reviewed: {Tip this will not be part of the note when signed-  Document your independent interpretation of telemetry tracing, EKG, lab, Radiology test or any other diagnostic tests. Add any new diagnostic test ordered today. (Optional):26781} {Results:26384}  Assessment and Plan: No notes have been filed under this hospital service. Service: Hospitalist     Advance Care Planning:   Code Status: Full Code ***  Consults: ***  Family Communication: ***  Severity of Illness: {Observation/Inpatient:21159}  Author: Dolly Rias, MD 01/24/2024 12:02 AM  For on call review [email or web address].

## 2024-01-24 NOTE — Progress Notes (Signed)
 Pt receives out-pt HD at [facility] on MWF. Will assist as needed.   Olivia Canter Renal Navigator [phone]

## 2024-01-24 NOTE — Consult Note (Signed)
 WOC Nurse Consult Note: patient has been followed at primary MDs (01/16/2024) office for R medial lower leg wound that he states began as a blister; was started on antibiotics and referred to wound care center (appointment 2 weeks from 01/16/2024 per their note)  Reason for Consult: wound RLE  Wound type: full thickness  Pressure Injury POA: NA  Measurement: see nursing flowsheet  Wound bed: per photo 01/16/24 100% yellow slough  Drainage (amount, consistency, odor) per nursing flowsheet  Periwound: dark discoloration above wound  Dressing procedure/placement/frequency: Cleanse R lower leg wound with Vashe wound cleanser [place] [phone]), do not rinse and allow to air dry.  Apply Medihoney to wound bed daily, cover with dry gauze and cover with silicone foam or ABD pad and Kerlix roll gauze to secure.    Patient should keep appointment for continued care of this wound at wound care center as scheduled by primary MD.   POC discussed with primary nurse.  WOC team will not follow. Re-consult if further needs arise.   Thank you,    Priscella Mann MSN, RN-BC, [employer] [phone]

## 2024-01-24 NOTE — Care Management Obs Status (Signed)
 MEDICARE OBSERVATION STATUS NOTIFICATION   Patient Details  Name: Jacob [name] MRN: [id number] Date of Birth: 25-May-1951   Medicare Observation Status Notification Given:   yes    [name], RN 01/24/2024, 5:35 PM

## 2024-01-24 NOTE — Discharge Instructions (Signed)
 Additional Discharge Instructions   Please get your medications reviewed and adjusted by your Primary MD.  Please request your Primary MD to go over all Hospital Tests and Procedure/Radiological results at the follow up, please get all Hospital records sent to your Primary MD by signing hospital release before you go home.  If you had Pneumonia of Lung problems at the Hospital: Please get a 2 view Chest X ray done in approximately 4 weeks after hospital discharge or sooner if instructed by your Primary MD.  If you have Congestive Heart Failure: Please call your Cardiologist or Primary MD anytime you have any of the following symptoms:  1) 3 pound weight gain in 24 hours or 5 pounds in 1 week  2) shortness of breath, with or without a dry hacking cough  3) swelling in the hands, feet or stomach  4) if you have to sleep on extra pillows at night in order to breathe  Follow cardiac low salt diet and 1.5 lit/day fluid restriction.  If you have diabetes Accuchecks 4 times/day, Once in AM empty stomach and then before each meal. Log in all results and show them to your primary doctor at your next visit. If any glucose reading is under 80 or above 300 call your primary MD immediately.  If you have Seizure/Convulsions/Epilepsy: Please do not drive, operate heavy machinery, participate in activities at heights or participate in high speed sports until you have seen by Primary MD or a Neurologist and advised to do so again. Per [facility] statutes, patients with seizures are not allowed to drive until they have been seizure-free for six months.  Use caution when using heavy equipment or power tools. Avoid working on ladders or at heights. Take showers instead of baths. Ensure the water temperature is not too high on the home water heater. Do not go swimming alone. Do not lock yourself in a room alone (i.e. bathroom). When caring for infants or small children, sit down when holding, feeding,  or changing them to minimize risk of injury to the child in the event you have a seizure. Maintain good sleep hygiene. Avoid alcohol.   If you had Gastrointestinal Bleeding: Please ask your Primary MD to check a complete blood count within one week of discharge or at your next visit. Your endoscopic/colonoscopic biopsies that are pending at the time of discharge, will also need to followed by your Primary MD.  Get Medicines reviewed and adjusted. Please take all your medications with you for your next visit with your Primary MD  Please request your Primary MD to go over all hospital tests and procedure/radiological results at the follow up, please ask your Primary MD to get all Hospital records sent to his/her office.  If you experience worsening of your admission symptoms, develop shortness of breath, life threatening emergency, suicidal or homicidal thoughts you must seek medical attention immediately by calling 911 or calling your MD immediately  if symptoms less severe.  You must read complete instructions/literature along with all the possible adverse reactions/side effects for all the Medicines you take and that have been prescribed to you. Take any new Medicines after you have completely understood and accpet all the possible adverse reactions/side effects.   Do not drive or operate heavy machinery when taking Pain medications.   Do not take more than prescribed Pain, Sleep and Anxiety Medications  Special Instructions: If you have smoked or chewed Tobacco  in the last 2 yrs please stop smoking, stop any  regular Alcohol  and or any Recreational drug use.  Wear Seat belts while driving.  Please note You were cared for by a hospitalist during your hospital stay. If you have any questions about your discharge medications or the care you received while you were in the hospital after you are discharged, you can call the unit and asked to speak with the hospitalist on call if the hospitalist  that took care of you is not available. Once you are discharged, your primary care physician will handle any further medical issues. Please note that NO REFILLS for any discharge medications will be authorized once you are discharged, as it is imperative that you return to your primary care physician (or establish a relationship with a primary care physician if you do not have one) for your aftercare needs so that they can reassess your need for medications and monitor your lab values.  You can reach the hospitalist office at phone [phone] or fax [phone]   If you do not have a primary care physician, you can call [phone] for a physician referral.

## 2024-01-24 NOTE — H&P (Addendum)
 History and Physical    [name]:[id number] DOB: 1951/10/12 DOA: 01/23/2024  PCP: [name], MD   Patient coming from: Home   Chief Complaint:  Chief Complaint  Patient presents with   Shortness of Breath    HPI:  Jacob [name] is a 73 y.o. male with hx of ESRD on MWF HD, ICM, heart failure with midrange ejection fraction, diastolic failure, CAD, COPD, hypertension, diabetes, history of osteomyelitis requiring amputation, GI bleed with AVMs, HIV, polysubstance abuse, current smoker, who presents with 1 day of dyspnea on exertion.  Reports that had HD yesterday with approximately 1.7 L UF, limited by hypotension.  Does take midodrine prior to HD and took yesterday as prescribed.  He is an anuric.  No chest pain, no other recent illness.   Review of Systems:  ROS complete and negative except as marked above   Allergies  Allergen Reactions   Augmentin [Amoxicillin-Pot Clavulanate] Diarrhea and Other (See Comments)    Severe diarrhea   Morphine Sulfate Anaphylaxis    Can't mix with oxycodone    Mucinex Fast-Max Other (See Comments)    Intense sweating    Amphetamines Other (See Comments)    Unknown reaction    Prior to Admission medications   Medication Sig Start Date End Date Taking? Authorizing Provider  acetaminophen (TYLENOL) 650 MG CR tablet Take 1,300 mg by mouth 3 (three) times daily.    [provider]  albuterol (PROVENTIL) (2.5 MG/3ML) 0.083% nebulizer solution Take 3 mLs (2.5 mg total) by nebulization every 6 (six) hours as needed for wheezing or shortness of breath. 06/20/23 06/19/24  [name], Dinah C, NP  allopurinol (ZYLOPRIM) 100 MG tablet TAKE 1 TABLET BY MOUTH DAILY 11/01/23   [name], MD  atorvastatin (LIPITOR) 10 MG tablet TAKE 1 TABLET(10 MG) BY MOUTH DAILY. 11/23/23   Gaston Islam., NP  B-D UF III MINI PEN NEEDLES 31G X 5 MM MISC USE 4 TIMES DAILY 07/13/23   [name], MD  BIKTARVY 50-200-25 MG TABS  tablet TAKE 1 TABLET BY MOUTH 1 TIME A DAY 01/04/24   Gardiner Barefoot, MD  Calcium Acetate 667 MG TABS Take 667-2,001 mg by mouth See admin instructions. Take 2001 mg by mouth three times a day with meals and 667 mg with each snack 01/20/21   [provider]  carvedilol (COREG) 3.125 MG tablet TAKE 1 TABLET(3.125 MG) BY MOUTH TWICE DAILY WITH A MEAL 11/28/23   Gaston Islam., NP  diclofenac Sodium (VOLTAREN) 1 % GEL Apply 2 grams topically to the affected area three times daily as needed for pain. 12/19/22   [name], MD  DOXYCYCLINE HYCLATE PO Take 100 mg by mouth in the morning and at bedtime.    [provider]  folic acid (FOLVITE) 1 MG tablet TAKE 1 TABLET(1 MG) BY MOUTH DAILY 11/27/23   [name], MD  gabapentin (NEURONTIN) 300 MG capsule Take 300 mg by mouth in the morning and at bedtime.    [provider]  glucose blood (ONETOUCH ULTRA) test strip Check blood sugar three times daily E11.40 05/18/23   [name], MD  HUMALOG KWIKPEN 100 UNIT/ML KwikPen Inject 15 Units into the skin 3 (three) times daily. 01/02/24   [name], MD  insulin degludec (TRESIBA) 100 UNIT/ML FlexTouch Pen Inject 15 Units into the skin daily. 3 01/02/24   [name], MD  isosorbide mononitrate (IMDUR) 30 MG 24 hr tablet Take 1 tablet (30  mg total) by mouth daily. 06/05/23   [name], MD  Lancets [facility] DELICA PLUS [place]) MISC USE 3 TIMES DAILY 08/15/22   [name], MD  latanoprost (XALATAN) 0.005 % ophthalmic solution Place 1 drop into both eyes at bedtime.    [provider]  lidocaine-prilocaine (EMLA) cream Apply topically as directed. At dialysis 10/30/23   [provider]  Menthol-Camphor (TIGER BALM ARTHRITIS RUB) 11-11 % CREA Apply 1 Application topically as needed (pain).    [provider]  Methoxy PEG-Epoetin Beta (MIRCERA IJ) as directed. At Dialysis 11/08/23 11/06/24  [provider]  midodrine (PROAMATINE) 10 MG tablet Take 10 mg by mouth 3 (three) times a week. 03/21/23   [provider]  multivitamin (RENA-VIT) TABS tablet Take 1 tablet by mouth daily.    [provider]  neomycin-bacitracin-polymyxin [phone] OINT Apply 1 Application topically in the morning and at bedtime. 12/26/23   [name], MD  octreotide (SANDOSTATIN LAR DEPOT) 10 MG injection Inject 10 mg into the muscle every 28 days. 08/22/23   Hilarie Fredrickson, MD  oxyCODONE-acetaminophen (PERCOCET) 5-325 MG tablet Take 1 tablet by mouth every 8 (eight) hours as needed for severe pain (pain score 7-10). 01/11/24   [name], MD  PREVIDENT 5000 PLUS 1.1 % CREA dental cream Place 1 Application onto teeth at bedtime. 05/25/23   [provider]  simethicone (MYLICON) 125 MG chewable tablet Chew 125 mg by mouth as needed for flatulence.    [provider]  timolol (TIMOPTIC) 0.5 % ophthalmic solution Place 1 drop into both eyes in the morning. 04/04/19   [provider]  umeclidinium-vilanterol (ANORO ELLIPTA) 62.5-25 MCG/ACT AEPB Inhale 1 puff into the lungs daily. 06/07/23   [name], MD    Past Medical History:  Diagnosis Date   Acute respiratory failure (HCC) 03/01/2018   Anemia    Arthritis    "all over; mostly knees and back" (02/28/2018)   Chronic combined systolic and diastolic CHF (congestive heart failure) (HCC)    Chronic lower back pain    stenosis   Community acquired pneumonia 09/06/2013   COPD (chronic obstructive pulmonary disease) (HCC)    Coronary atherosclerosis of native coronary artery    a. 02/2003 s/p CABG x 2 (VG->RI, VG->RPDA; b. 11/2019 PCI: LM nl, LAD 90d, D3 50, RI 100, LCX 100p, OM3 100 - fills via L->L collats from D2/dLAD, RCA 100p, VG->RPDA ok, VG->RI 95 (3.5x48 Synergy XD DES).   Drug abuse (HCC)    hx; tested for cocaine as recently as 2/08. says he is not using drugs now - avoided defib. for this reason     ESRD (end stage renal disease) (HCC)    Hemo M-W-F- [facility]   Fall at home 10/2020   GERD (gastroesophageal reflux disease)    takes OTC meds as needed   GI bleeding    a. 11/2019 EGD: angiodysplastic lesions w/ bleeding s/p argon plasma/clipping/epi inj. Multiple admissions for the same.   Glaucoma    uses eye drops daily   Hepatitis B 1968   "tx'd w/isolation; caught it from toilet stools in gym"   History of blood transfusion 03/01/2019   History of colon polyps    benign   History of gout    takes Allopurinol daily as well as Colchicine-if needed (02/28/2018)   History of kidney stones    HTN (hypertension)    takes Coreg,Imdur.and Apresoline daily   Human immunodeficiency virus (HIV)  disease (HCC) dx'd 62   on Biktarvy as of 12/2020.     Hyperlipidemia    Ischemic cardiomyopathy    a. 01/2019 Echo: EF 40-45%, diffuse HK, mild basal septal hypertrophy. Diast dysfxn. Nl RV size/fxn. Sev dil LA. Triv MR/TR/PR.   Muscle spasm    takes Zanaflex as needed   Myocardial infarction (HCC) ~ 2004/2005   Nocturia    Peripheral neuropathy    takes gabapentin daily   Pneumonia    "at least twice" (02/28/2018)   SDH (subdural hematoma) (HCC)    Syphilis, unspecified    Type II diabetes mellitus (HCC) 2004   Lantus daily.Average fasting blood sugar 125-199   Wears glasses    Wears partial dentures     Past Surgical History:  Procedure Laterality Date   A/V FISTULAGRAM N/A 12/07/2023   Procedure: A/V Fistulagram;  Surgeon: Tyler Pita, MD;  Location: [facility] INVASIVE CV LAB;  Service: Cardiovascular;  Laterality: N/A;   AV FISTULA PLACEMENT Left 08/02/2018   Procedure: ARTERIOVENOUS (AV) FISTULA CREATION  left arm radiocephlic;  Surgeon: Cephus Shelling, MD;  Location: [facility] OR;  Service: Vascular;  Laterality: Left;   AV FISTULA PLACEMENT Left 08/01/2019   Procedure: LEFT BRACHIOCEPHALIC ARTERIOVENOUS (AV) FISTULA CREATION;  Surgeon: Larina Earthly, MD;  Location: [facility] OR;  Service:  Vascular;  Laterality: Left;   AV FISTULA PLACEMENT Right 04/12/2022   Procedure: RIGHT UPPER EXTREMITY ARTERIOVENOUS (AV)  GRAFT INSERTION USING GORE STRETCH 4-7 MM;  Surgeon: Chuck Hint, MD;  Location: [facility] OR;  Service: Vascular;  Laterality: Right;   BASCILIC VEIN TRANSPOSITION Left 10/03/2019   Procedure: BASILIC VEIN TRANSPOSITION LEFT SECOND STAGE;  Surgeon: Larina Earthly, MD;  Location: [facility] OR;  Service: Vascular;  Laterality: Left;   BIOPSY  01/25/2021   Procedure: BIOPSY;  Surgeon: Sherrilyn Rist, MD;  Location: [facility] ENDOSCOPY;  Service: Gastroenterology;;   CARDIAC CATHETERIZATION  10/2002; 12/19/2004   Hattie Perch 03/08/2011   COLONOSCOPY  2013   Dr.John Marina Goodell    CORONARY ARTERY BYPASS GRAFT  02/24/2003   CABG X2/notes 03/08/2011   CORONARY STENT INTERVENTION N/A 12/19/2019   Procedure: CORONARY STENT INTERVENTION;  Surgeon: Corky Crafts, MD;  [facility]: [facility] INV[facility] CV LAB;  Service: Cardiovascular;  Laterality: N/A;   CORONARY ULTRASOUND/IVUS N/A 12/19/2019   Procedure: Intravascular Ultrasound/IVUS;  Surgeon: Corky Crafts, MD;  Location: [facility] INVASIVE CV LAB;  Service: Cardiovascular;  Laterality: N/A;   ENTEROSCOPY N/A 01/25/2021   Procedure: ENTEROSCOPY;  Surgeon: Sherrilyn Rist, MD;  Location: [facility] ENDOSCOPY;  Service: Gastroenterology;  Laterality: N/A;   ENTEROSCOPY N/A 02/13/2021   Procedure: ENTEROSCOPY;  Surgeon: Lynann Bologna, MD;  Location: [facility] ENDOSCOPY;  Service: Endoscopy;  Laterality: N/A;   ENTEROSCOPY N/A 05/07/2021   Procedure: ENTEROSCOPY;  Surgeon: Benancio Deeds, MD;  Location: [facility] ENDOSCOPY;  Service: Gastroenterology;  Laterality: N/A;   ESOPHAGOGASTRODUODENOSCOPY (EGD) WITH PROPOFOL N/A 02/08/2019   Procedure: ESOPHAGOGASTRODUODENOSCOPY (EGD) WITH PROPOFOL;  Surgeon: Rachael Fee, MD;  Location: [facility] ENDOSCOPY;  Service: Gastroenterology;  Laterality: N/A;   ESOPHAGOGASTRODUODENOSCOPY (EGD) WITH PROPOFOL N/A 12/22/2019   Procedure:  ESOPHAGOGASTRODUODENOSCOPY (EGD) WITH PROPOFOL;  Surgeon: Shellia Cleverly, DO;  Location: [facility] ENDOSCOPY;  Service: Gastroenterology;  Laterality: N/A;   ESOPHAGOGASTRODUODENOSCOPY (EGD) WITH PROPOFOL N/A 10/19/2020   Procedure: ESOPHAGOGASTRODUODENOSCOPY (EGD) WITH PROPOFOL;  Surgeon: Lynann Bologna, MD;  Location: [facility] ENDOSCOPY;  Service: Endoscopy;  Laterality: N/A;   ESOPHAGOGASTRODUODENOSCOPY (EGD) WITH PROPOFOL N/A 12/22/2020   Procedure: ESOPHAGOGASTRODUODENOSCOPY (  EGD) WITH PROPOFOL;  Surgeon: Iva Boop, MD;  Location: [facility] ENDOSCOPY;  Service: Endoscopy;  Laterality: N/A;   ESOPHAGOGASTRODUODENOSCOPY (EGD) WITH PROPOFOL N/A 01/09/2021   Procedure: ESOPHAGOGASTRODUODENOSCOPY (EGD) WITH PROPOFOL;  Surgeon: Hilarie Fredrickson, MD;  Location: [facility] ENDOSCOPY;  Service: Endoscopy;  Laterality: N/A;   HEMOSTASIS CLIP PLACEMENT  12/22/2019   Procedure: HEMOSTASIS CLIP PLACEMENT;  Surgeon: Shellia Cleverly, DO;  Location: [facility] ENDOSCOPY;  Service: Gastroenterology;;   HEMOSTASIS CLIP PLACEMENT  12/22/2020   Procedure: HEMOSTASIS CLIP PLACEMENT;  Surgeon: Iva Boop, MD;  Location: [facility] ENDOSCOPY;  Service: Endoscopy;;   HEMOSTASIS CONTROL  12/22/2020   Procedure: HEMOSTASIS CONTROL/hemospray;  Surgeon: Iva Boop, MD;  Location: [facility] ENDOSCOPY;  Service: Endoscopy;;   HOT HEMOSTASIS N/A 02/08/2019   Procedure: HOT HEMOSTASIS (ARGON PLASMA COAGULATION/BICAP);  Surgeon: Rachael Fee, MD;  Location: [facility] ENDOSCOPY;  Service: Gastroenterology;  Laterality: N/A;   HOT HEMOSTASIS N/A 12/22/2019   Procedure: HOT HEMOSTASIS (ARGON PLASMA COAGULATION/BICAP);  Surgeon: Shellia Cleverly, DO;  Location: [facility] ENDOSCOPY;  Service: Gastroenterology;  Laterality: N/A;   HOT HEMOSTASIS N/A 10/19/2020   Procedure: HOT HEMOSTASIS (ARGON PLASMA COAGULATION/BICAP);  Surgeon: Lynann Bologna, MD;  Location: [facility] ENDOSCOPY;  Service: Endoscopy;  Laterality: N/A;   HOT HEMOSTASIS N/A 12/22/2020   Procedure: HOT HEMOSTASIS (ARGON  PLASMA COAGULATION/BICAP);  Surgeon: Iva Boop, MD;  Location: [facility] ENDOSCOPY;  Service: Endoscopy;  Laterality: N/A;   HOT HEMOSTASIS N/A 01/09/2021   Procedure: HOT HEMOSTASIS (ARGON PLASMA COAGULATION/BICAP);  Surgeon: Hilarie Fredrickson, MD;  Location: [facility] ENDOSCOPY;  Service: Endoscopy;  Laterality: N/A;   HOT HEMOSTASIS N/A 01/25/2021   Procedure: HOT HEMOSTASIS (ARGON PLASMA COAGULATION/BICAP);  Surgeon: Sherrilyn Rist, MD;  Location: [facility] ENDOSCOPY;  Service: Gastroenterology;  Laterality: N/A;   HOT HEMOSTASIS N/A 02/13/2021   Procedure: HOT HEMOSTASIS (ARGON PLASMA COAGULATION/BICAP);  Surgeon: Lynann Bologna, MD;  Location: [facility] ENDOSCOPY;  Service: Endoscopy;  Laterality: N/A;   HOT HEMOSTASIS N/A 05/07/2021   Procedure: HOT HEMOSTASIS (ARGON PLASMA COAGULATION/BICAP);  Surgeon: Benancio Deeds, MD;  Location: [facility] ENDOSCOPY;  Service: Gastroenterology;  Laterality: N/A;   INSERTION OF DIALYSIS CATHETER N/A 02/08/2022   Procedure: INSERTION OF TUNNELED DIALYSIS CATHETER;  Surgeon: Chuck Hint, MD;  Location: [facility] OR;  Service: Vascular;  Laterality: N/A;   INTERTROCHANTERIC HIP FRACTURE SURGERY Left 11/2006   Hattie Perch 03/08/2011   IR FLUORO GUIDE CV LINE LEFT  03/14/2022   IR FLUORO GUIDE CV LINE RIGHT  07/24/2019   IR FLUORO GUIDE CV LINE RIGHT  07/30/2019   IR US GUIDE VASC ACCESS RIGHT  07/24/2019   IR US GUIDE VASC ACCESS RIGHT  07/30/2019   LAPAROSCOPIC CHOLECYSTECTOMY  05/2006   LIGATION OF ARTERIOVENOUS  FISTULA Left 02/08/2022   Procedure: LIGATION OF LEFT ARTERIOVENOUS  FISTULA;  Surgeon: Chuck Hint, MD;  Location: [facility] OR;  Service: Vascular;  Laterality: Left;   LIGATION OF COMPETING BRANCHES OF ARTERIOVENOUS FISTULA Left 11/05/2018   Procedure: LIGATION OF COMPETING BRANCHES OF ARTERIOVENOUS FISTULA  LEFT  ARM;  Surgeon: Cephus Shelling, MD;  Location: [facility] OR;  Service: Vascular;  Laterality: Left;   LUMBAR LAMINECTOMY/DECOMPRESSION MICRODISCECTOMY N/A 02/29/2016    Procedure: Left L4-5 Lateral Recess Decompression, Removal Extradural Intraspinal Facet Cyst;  Surgeon: Eldred Manges, MD;  Location: [facility] OR;  Service: Orthopedics;  Laterality: N/A;   METATARSAL HEAD EXCISION Right 07/20/2022   Procedure: Irragation and debridement of right foot wound with extraction of all necrotic  soft tissue and bone;  Surgeon: Pilar Plate, DPM;  Location: [facility] OR;  Service: Podiatry;  Laterality: Right;  Right 4th Met head and base of proximal phalanx resection   MULTIPLE TOOTH EXTRACTIONS     ORIF MANDIBULAR FRACTURE Left 08/13/2004   ORIF of left body fracture mandible with KLS Martin 2.3-mm six hole/notes 03/08/2011   RIGHT/LEFT HEART CATH AND CORONARY/GRAFT ANGIOGRAPHY N/A 12/19/2019   Procedure: RIGHT/LEFT HEART CATH AND CORONARY/GRAFT ANGIOGRAPHY;  Surgeon: Corky Crafts, MD;  Location: [facility] INVASIVE CV LAB;  Service: Cardiovascular;  Laterality: N/A;   SCLEROTHERAPY  12/22/2019   Procedure: SCLEROTHERAPY;  Surgeon: Shellia Cleverly, DO;  Location: [facility] ENDOSCOPY;  Service: Gastroenterology;;   Susa Day  02/13/2021   Procedure: Susa Day;  Surgeon: Lynann Bologna, MD;  Location: [facility] ENDOSCOPY;  Service: Endoscopy;;     reports that he has been smoking cigarettes. He has a 21.5 pack-year smoking history. He has never been exposed to tobacco smoke. He has never used smokeless tobacco. He reports that he does not currently use alcohol after a past usage of about 12.0 standard drinks of alcohol per week. He reports that he does not currently use drugs after having used the following drugs: Cocaine.  Family History  Problem Relation Age of Onset   Heart failure Father    Hypertension Father    Diabetes Brother    Heart attack Brother    Alzheimer's disease Mother    Stroke Sister    Diabetes Sister    Alzheimer's disease Sister    Hypertension Brother    Diabetes Brother    Drug abuse Brother    Colon cancer Neg Hx      Physical Exam: Vitals:    01/23/24 2215 01/23/24 2230 01/23/24 2245 01/23/24 2315  BP: 116/63 131/72 (!) 112/55 104/73  Pulse:   76   Resp: 17 20 20 13   Temp:      TempSrc:      SpO2:   95%   Weight:      Height:        Gen: Awake, alert, chronically ill-appearing CV: Regular, normal S1, S2, no murmurs  Resp: Normal WOB, rales up to third posterior fields. Abd: Flat, normoactive, nontender MSK: Symmetric, 1+ lower extremity edema Skin: Wound on the right shin with dressing in place, not removed for exam.  No rashes or lesions to exposed skin  Neuro: Alert and interactive  Psych: euthymic, appropriate    Data review:   Labs reviewed, notable for:   BNP 11/26[birth year]1968 High-sensitivity troponin 6460 Creatinine consistent with ESRD, no acute electrolyte abnormalities   Micro:  Results for orders placed or performed during the hospital encounter of 12/27/23  Resp panel by RT-PCR (RSV, Flu A&B, Covid) Anterior Nasal Swab     Status: None   Collection Time: 12/27/23  4:33 PM   Specimen: Anterior Nasal Swab  Result Value Ref Range Status   SARS Coronavirus 2 by RT PCR NEGATIVE NEGATIVE Final   Influenza A by PCR NEGATIVE NEGATIVE Final   Influenza B by PCR NEGATIVE NEGATIVE Final    Comment: (NOTE) The Xpert Xpress SARS-CoV-2/FLU/RSV plus assay is intended as an aid in the diagnosis of influenza from Nasopharyngeal swab specimens and should not be used as a sole basis for treatment. Nasal washings and aspirates are unacceptable for Xpert Xpress SARS-CoV-2/FLU/RSV testing.  Fact Sheet for Patients: BloggerCourse.com  Fact Sheet for Healthcare Providers: SeriousBroker.it  This test is not yet approved or cleared by the [place] FDA and has been authorized for detection and/or diagnosis of SARS-CoV-2 by FDA under an Emergency Use Authorization (EUA). This EUA will remain in effect (meaning this test can be used) for the duration of the COVID-19  declaration under Section 564(b)(1) of the Act, 21 U.S.C. section 360bbb-3(b)(1), unless the authorization is terminated or revoked.     Resp Syncytial Virus by PCR NEGATIVE NEGATIVE Final    Comment: (NOTE) Fact Sheet for Patients: BloggerCourse.com  Fact Sheet for Healthcare Providers: SeriousBroker.it  This test is not yet approved or cleared by the [place] FDA and has been authorized for detection and/or diagnosis of SARS-CoV-2 by FDA under an Emergency Use Authorization (EUA). This EUA will remain in effect (meaning this test can be used) for the duration of the COVID-19 declaration under Section 564(b)(1) of the Act, 21 U.S.C. section 360bbb-3(b)(1), unless the authorization is terminated or revoked.  Performed at [facility], [address] [place], [name] [address]    *Note: Due to a large number of results and/or encounters for the requested time period, some results have not been displayed. A complete set of results can be found in Results Review.    Imaging reviewed:  DG Chest Port 1 View Result Date: 01/23/2024 CLINICAL DATA:  Shortness of breath EXAM: PORTABLE CHEST 1 VIEW COMPARISON:  12/27/2023 FINDINGS: Prior CABG. Cardiomegaly with vascular congestion. Diffuse interstitial opacities throughout the lungs, likely edema. No effusions or acute bony abnormality. IMPRESSION: Cardiomegaly with vascular congestion and mild-to-moderate interstitial edema. Electronically Signed   By: Charlett Nose M.D.   On: 01/23/2024 20:37     ED Course:  Nephrology Dr. Valentino Nose was consulted and plan for HD today this evening or tomorrow morning.    Assessment/Plan:  73 y.o. male with hx ESRD on MWF HD, ICM, heart failure with midrange ejection fraction, diastolic failure, CAD, CABG, COPD, hypertension, diabetes, neuropathy, history of osteomyelitis requiring amputation, GI bleed with AVMs, HIV, polysubstance abuse, current  smoker, who presents with 1 day of dyspnea on exertion.  Found to have volume overload in setting of his ESRD with limited UF on recent HD session and underlying heart failure.  Volume overload ESRD on MWF HD -Nephrology Dr. Valentino Nose was consulted and plan for HD today this evening or tomorrow morning.  -Anuric no role for diuresis -Give midodrine 10 mg 1 hour before HD to support BP.,  Can uptitrate if needed -For now hold home carvedilol and isosorbide mononitrate  Ischemic cardiomyopathy, heart failure with midrange ejection fraction, diastolic failure - Volume management per HD above - Repeat echo  Acute myocardial injury No history of chest pain.  EKG without acute ischemic changes.  High-sensitivity troponin flat at 64 -> 16.  Presumed demand ischemia in the setting of volume overload. - Management directed at underlying volume overload above.   RLE wound  -Continue wound care inpatient.  Chronic medical problems: CAD with history CABG: Not on aspirin due to history of GI bleeding.  Continue home atorvastatin 10 mg COPD: Albuterol as needed Hypertension: Home regimen Coreg 3.125 mg twice daily, isosorbide mononitrate 30 mg daily.  Consider stopping his nitrate if he is not dealing with chronic angina in the setting of hypotension limiting HD UF.  Held both for now. Diabetes: Home regimen Tresiba 15 units daily, Humalog 15 units 3 times daily.  For now slight reduction to Semglee 12 units daily, aspart 5 units 3 times daily with meals with SSI for very sensitive. Neuropathy: Continue home gabapentin  History of OM requiring amputation: Noted History of GI bleed with AVM, prior APC: On monthly Octreotide injection HIV: Continue home Biktarvy Polysubstance use disorder: Check tox screen Current smoking/ smoking cessation: Counseled on smoking cessation.  Nicotine patch and gum as needed.  Rx at discharge. Chronic pain: Continue home oxycodone Tylenol 5 mg every 8 hours as needed for  severe. Glaucoma: Continue home gtts Gout: Continue home allopurinol  Body mass index is 25.62 kg/m.    DVT prophylaxis:  SCDs Code Status:  Full Code Diet:  Diet Orders (From admission, onward)    None      Family Communication:  No   Consults:  Nephrology   Admission status:   Observation, Telemetry bed  Severity of Illness: The appropriate patient status for this patient is OBSERVATION. Observation status is judged to be reasonable and necessary in order to provide the required intensity of service to ensure the patient's safety. The patient's presenting symptoms, physical exam findings, and initial radiographic and laboratory data in the context of their medical condition is felt to place them at decreased risk for further clinical deterioration. Furthermore, it is anticipated that the patient will be medically stable for discharge from the hospital within 2 midnights of admission.    Dolly Rias, MD [facility]  How to contact the [facility] Attending or Consulting provider 7A - 7P or covering provider during after hours 7P -7A, for this patient.  Check the care team in [facility] and look for a) attending/consulting [facility] provider listed and b) the [facility] team listed Log into [email or web address] and use [place]'s universal password to access. If you do not have the password, please contact the hospital operator. Locate the [facility] provider you are looking for under [facility] and page to a number that you can be directly reached. If you still have difficulty reaching the provider, please page the [facility] (Director on Call) for the Hospitalists listed on amion for assistance.  01/24/2024, 12:05 AM

## 2024-01-25 ENCOUNTER — Telehealth: Payer: Self-pay

## 2024-01-25 ENCOUNTER — Ambulatory Visit: Payer: Self-pay

## 2024-01-25 ENCOUNTER — Ambulatory Visit (HOSPITAL_COMMUNITY)
Admission: RE | Admit: 2024-01-25 | Discharge: 2024-01-25 | Disposition: A | Discharge status: Home / Self Care | Source: Ambulatory Visit | Attending: Gastroenterology | Admitting: Gastroenterology

## 2024-01-25 DIAGNOSIS — Q2730 Arteriovenous malformation, site unspecified: Secondary | ICD-10-CM | POA: Diagnosis not present

## 2024-01-25 DIAGNOSIS — D649 Anemia, unspecified: Secondary | ICD-10-CM | POA: Insufficient documentation

## 2024-01-25 MED ORDER — OCTREOTIDE ACETATE 10 MG IM KIT
10.0000 mg | PACK | INTRAMUSCULAR | Status: DC
  Administered 2024-01-25: 10 mg via INTRAMUSCULAR
  Filled 2024-01-25: qty 1

## 2024-01-25 NOTE — Patient Instructions (Signed)
 Visit Information  Thank you for taking time to visit with me today. Please don't hesitate to contact me if I can be of assistance to you.   Following are the goals we discussed today:   Goals Addressed             This Visit's Progress    COMPLETED: RN Care Coordination Activities: no further follow up needed       Care Coordination Interventions: Evaluation of current treatment plan related to shortness of breath  and patient's adherence to plan as established by provider Determined patient experienced a short stay at [facility] for shortness of breath  Discussed patient experienced abnormal BP during his dialysis treatment, therefore patient did not have the full amount of fluid removed resulting in worsening shortness of breath  Reviewed and discussed patient was found to have pulmonary edema for which he received an additional dialysis treatment with adequate fluid removal  Reviewed and discussed patient's discharge instructions with patient he verbalizes understanding         To have ankle ulcerations heal without complications   On track    Care Coordination Interventions: Evaluation of current treatment plan related to cellulitis to left lower extremity and patient's adherence to plan as established by provider Determined patient verbalizes understanding of his prescribed treatment plan, he is declining to start home health services at this time for wound care but may consider starting home health after completing his wound care visit   Educated patient about PCP referral for home health and u/s ABI and next steps, determined patient received a call from [facility] advising he is unable to have the test at this location due to having an open wound Discussed patient was advised he will need to have the ABI completed at [facility]  Provided patient with the contact number for [facility] and patient will call when ready   Reviewed and discussed with patient his initial visit at the [facility] with Dr. Mikey Bussing is scheduled for 02/08/24 @12 :30 PM Discussed plans with patient for ongoing care coordination follow up and provided patient with direct contact information for nurse care coordinator Schedule a nurse follow up call with patient for 02/13/24 @11 :30 AM     To keep chronic pain well managed   On track    Care Coordination Interventions: Reviewed provider established plan for pain management Reviewed and discussed with patient recent PCP referral for pain management  Reviewed and discussed patient's new patient appointment to see Dr. Fanny Dance, MD on 01/30/24 @10 :20 AM  Discussed plans with patient for ongoing care coordination follow up and provided patient with direct contact information for nurse care coordinator Scheduled nurse follow up call with patient for 02/13/24 @11 :30 AM           Our next appointment is by telephone on 02/13/24 at 11:30 AM  Please call the care guide team at [phone] if you need to cancel or reschedule your appointment.   If you are experiencing a Mental Health or Behavioral Health Crisis or need someone to talk to, please call 1-800-273-TALK (toll free, 24 hour hotline)  The patient verbalized understanding of instructions, educational materials, and care plan provided today and DECLINED offer to receive copy of patient instructions, educational materials, and care plan.

## 2024-01-25 NOTE — Discharge Planning (Signed)
 [facility] Patient Discharge Orders- [facility] CLINIC: [facility]  Patient's name: Jacob [name] Admit/DC Dates: 01/23/2024 - 01/24/2024  Discharge Diagnoses: Volume overload   hyperkalemia  Aranesp: Given: no   Date and amount of last dose: --  Last Hgb: 12.3 PRBC's Given: no Date/# of units: -- ESA dose for discharge: none IV Iron dose at discharge: no  Heparin change: no  EDW Change: no New EDW:   Bath Change: no  Access intervention/Change: no Details:  Hectorol/Calcitriol change: no  Discharge Labs: Calcium 9.1 Phosphorus 6.8 Albumin 3.2 K+ 6.1  IV Antibiotics: no Details:  On Coumadin?: no Last INR: Next INR: Managed By:   OTHER/APPTS/LAB ORDERS:    D/C Meds to be reconciled by nurse after every discharge.  Completed By: Rogers Blocker, PA-C 01/25/2024, 8:37 AM  [facility] Pager: [phone]    Reviewed by: MD:______ RN_______

## 2024-01-25 NOTE — Progress Notes (Signed)
 Late Note Entry- January 25, 2024  Pt was d/c yesterday afternoon. Contacted [facility] this morning to be advised of pt's d/c date and that pt should resume care tomorrow.  Olivia Canter Renal Navigator [phone]

## 2024-01-25 NOTE — Telephone Encounter (Signed)
 Copied from CRM [id number]. Topic: General - Other >> Jan 25, 2024  8:38 AM Carrielelia G wrote: Reason for CRM: Please call pt Jacob [name] in regards to the status of him getting  Home Health care services.  Spoke with patient wanted asked about the update of the status the referral Home Health care services. Spoke with Albin Felling and she said that the referral is pending due to staffing and insurance but she working on it for the patient to receive care. Message sent to [name], MD  The following the referral as follow:  Referral Referral # [id number] Referral Information  Referral # Creation Date Referral Status Status Update   [id number] 01/16/2024 Pending Review 01/16/2024: Status History     Status Reason Referral Type Referral Reasons Referral Class  Other Home Health Care Specialty Services Required Internal     To Specialty To Provider To Location/Place of Service To Department  Home Health Services none none none     To Vendor Referred By By Location/Place of Service By Department  none [name], MD [facility]     Priority Start Date Expiration Date Referral Entered By  Routine 01/16/2024 01/15/2025 [name], MD     Visits Requested Visits Authorized Visits Completed Visits Scheduled  1 1     Procedure Information  Service Details Procedure Modifiers Provider Requested Approved  REF34 - AMB REFERRAL TO HOME HEALTH none  1 1   Diagnosis Information  Diagnosis  L97.922 (ICD-10-CM) - Ulcer of left lower extremity with fat layer exposed [facility])   Referral Notes Number of Notes: 9 . Type Date User Summary Attachment  General 01/24/2024  3:20 PM Myra Rude 01/23/24 pt is in the hospital -  Note: 01/23/24 pt is in the hospital . Type Date User Summary Attachment  General 01/19/2024  2:38 PM Lauralee Evener D Appt 02/08/24 [facility] with other provider's referral- -  Note: Appt 02/08/24 [facility] linked with NP APPT with other provider- Bronwen Betters DPM . Type Date User Summary Attachment  General 01/18/2024  8:50 AM Myra Rude Sent message to Belenda Cruise with [facility] -  Note: Sent message to [facility] with [facility] . Type Date User Summary Attachment  General 01/18/2024  8:46 AM Lauralee Evener D Auto: Referral message -  Note: ----- Message ----- From: Myra Rude Sent: 01/18/2024   8:46 AM EDT To: [name], MD Subject: Declined services                               Good morning.   Jacob [name] continues to be declined for services with home health agencies due to his conduct. I am working to obtain wound care services for him at home. Thanks for your patience. I have noted other patients improve with HBOT. Seems fairly new to me and wondered if this could help Jacob [name]. It may not with his other conditions and barriers. I Just thought it was worth mentioning. Hope these thoughts are met with kindness as a collaborative effort, as I am not a clinical person; just want to help a man who needs care; even though he has not been able to retain care services. Thanks for entertaining my friendly advocacy for his care. Let me know if this is inappropriate communication. -Carla . Type Date User Summary Attachment  General 01/18/2024  8:24 AM Lauralee Evener D [facility] denied referral- [facility] will not be able to  provide services. -  Note: [facility] denied referral- [facility] will not be able to provide services. . Type Date User Summary Attachment  General 01/17/2024  4:55 PM Lauralee Evener D Message to [facility] -  Note: Message to [employer] . Type Date User Summary Attachment  General 01/17/2024  4:50 PM Lauralee Evener D Referral denied per [facility]- pt discharged from their services and they are not able to reinstate services. -  Note: Referral denied per [facility]- pt discharged from their services and they are not able to reinstate services. . Type Date User  Summary Attachment  General 01/17/2024  1:57 PM Myra Rude Message to [facility] -  Note: Message to [place]     . Type Date User Summary Attachment  Provider Comments 01/16/2024  2:21 PM [name], MD Provider Comments -  Note: Please evaluate Jacob [name] for admission to [facility].   Disciplines requested: Nursing   Services to provide: [facility]   Physician to follow patient's care (the person listed here will be responsible for signing ongoing orders): PCP   Requested Start of Care Date: Within 2-3 days   I certify that this patient is under my care and that I, or a Nurse Practitioner or Physician's Assistant working with me, had a face-to-face encounter that meets the physician face-to-face requirements with patient on 01/16/24. The encounter with the patient was in whole, or in part for the following medical condition(s) which is the primary reason for home health care (List medical condition).

## 2024-01-25 NOTE — Patient Outreach (Signed)
 Care Coordination   Follow Up Visit Note   01/25/2024 Name: Jacob [name] MRN: [id number] DOB: 12-19-1950  Jacob [name] is a 73 y.o. year old male who sees [name], MD for primary care. I spoke with  David Stall by phone today.  What matters to the patients health and wellness today?  Patient would like to establish with the wound care clinic.     Goals Addressed             This Visit's Progress    COMPLETED: RN Care Coordination Activities: no further follow up needed       Care Coordination Interventions: Evaluation of current treatment plan related to shortness of breath  and patient's adherence to plan as established by provider Determined patient experienced a short stay at [facility] Department for shortness of breath  Discussed patient experienced abnormal BP during his dialysis treatment, therefore patient did not have the full amount of fluid removed resulting in worsening shortness of breath  Reviewed and discussed patient was found to have pulmonary edema for which he received an additional dialysis treatment with adequate fluid removal  Reviewed and discussed patient's discharge instructions with patient he verbalizes understanding      To have ankle ulcerations heal without complications   On track    Care Coordination Interventions: Evaluation of current treatment plan related to cellulitis to left lower extremity and patient's adherence to plan as established by provider Determined patient verbalizes understanding of his prescribed treatment plan, he is declining to start home health services at this time for wound care but may consider starting home health after completing his wound care visit   Educated patient about PCP referral for home health and u/s ABI and next steps, determined patient received a call from [facility] advising he is unable to have the test at this location due to having an open wound Discussed patient was  advised he will need to have the ABI completed at [facility]  Provided patient with the contact number for [facility] and patient will call when ready  Reviewed and discussed with patient his initial visit at the Wound Care Clinic with Dr. Mikey Bussing is scheduled for 02/08/24 @12 :30 PM Discussed plans with patient for ongoing care coordination follow up and provided patient with direct contact information for nurse care coordinator Schedule a nurse follow up call with patient for 02/13/24 @11 :30 AM     To keep chronic pain well managed   On track    Care Coordination Interventions: Reviewed provider established plan for pain management Reviewed and discussed with patient recent PCP referral for pain management  Reviewed and discussed patient's new patient appointment to see Dr. Fanny Dance, MD on 01/30/24 @10 :20 AM  Discussed plans with patient for ongoing care coordination follow up and provided patient with direct contact information for nurse care coordinator Scheduled nurse follow up call with patient for 02/13/24 @11 :30 AM       Interventions Today    Flowsheet Row Most Recent Value  Chronic Disease   Chronic disease during today's visit Chronic Kidney Disease/End Stage Renal Disease (ESRD), Other  [non-healing wound]  General Interventions   General Interventions Discussed/Reviewed General Interventions Discussed, General Interventions Reviewed, Doctor Visits, Labs  Doctor Visits Discussed/Reviewed Doctor Visits Discussed, Doctor Visits Reviewed, Specialist, PCP  Education Interventions   Education Provided Provided Education  Provided Verbal Education On When to see the doctor, Labs, Medication  Pharmacy Interventions   Pharmacy  Dicussed/Reviewed Pharmacy Topics Discussed, Pharmacy Topics Reviewed          SDOH assessments and interventions completed:  No     Care Coordination Interventions:  Yes, provided   Follow up plan: Follow up call  scheduled for 02/13/24 @11 :30 AM    Encounter Outcome:  Patient Visit Completed

## 2024-01-26 DIAGNOSIS — D631 Anemia in chronic kidney disease: Secondary | ICD-10-CM | POA: Diagnosis not present

## 2024-01-26 DIAGNOSIS — R197 Diarrhea, unspecified: Secondary | ICD-10-CM | POA: Diagnosis not present

## 2024-01-26 DIAGNOSIS — N186 End stage renal disease: Secondary | ICD-10-CM | POA: Diagnosis not present

## 2024-01-26 DIAGNOSIS — N2581 Secondary hyperparathyroidism of renal origin: Secondary | ICD-10-CM | POA: Diagnosis not present

## 2024-01-26 DIAGNOSIS — Z992 Dependence on renal dialysis: Secondary | ICD-10-CM | POA: Diagnosis not present

## 2024-01-26 DIAGNOSIS — E1122 Type 2 diabetes mellitus with diabetic chronic kidney disease: Secondary | ICD-10-CM | POA: Diagnosis not present

## 2024-01-26 DIAGNOSIS — D688 Other specified coagulation defects: Secondary | ICD-10-CM | POA: Diagnosis not present

## 2024-01-29 DIAGNOSIS — D631 Anemia in chronic kidney disease: Secondary | ICD-10-CM | POA: Diagnosis not present

## 2024-01-29 DIAGNOSIS — E1122 Type 2 diabetes mellitus with diabetic chronic kidney disease: Secondary | ICD-10-CM | POA: Diagnosis not present

## 2024-01-29 DIAGNOSIS — N2581 Secondary hyperparathyroidism of renal origin: Secondary | ICD-10-CM | POA: Diagnosis not present

## 2024-01-29 DIAGNOSIS — R197 Diarrhea, unspecified: Secondary | ICD-10-CM | POA: Diagnosis not present

## 2024-01-29 DIAGNOSIS — N186 End stage renal disease: Secondary | ICD-10-CM | POA: Diagnosis not present

## 2024-01-29 DIAGNOSIS — D688 Other specified coagulation defects: Secondary | ICD-10-CM | POA: Diagnosis not present

## 2024-01-29 DIAGNOSIS — Z992 Dependence on renal dialysis: Secondary | ICD-10-CM | POA: Diagnosis not present

## 2024-01-30 ENCOUNTER — Encounter: Payer: Self-pay | Admitting: Physical Medicine & Rehabilitation

## 2024-01-30 ENCOUNTER — Encounter: Attending: Physical Medicine & Rehabilitation | Admitting: Physical Medicine & Rehabilitation

## 2024-01-30 VITALS — BP 109/66 | HR 82 | Ht 74.0 in | Wt 196.0 lb

## 2024-01-30 DIAGNOSIS — M25561 Pain in right knee: Secondary | ICD-10-CM | POA: Insufficient documentation

## 2024-01-30 DIAGNOSIS — Z5181 Encounter for therapeutic drug level monitoring: Secondary | ICD-10-CM | POA: Diagnosis not present

## 2024-01-30 DIAGNOSIS — Z79899 Other long term (current) drug therapy: Secondary | ICD-10-CM | POA: Diagnosis not present

## 2024-01-30 DIAGNOSIS — M545 Low back pain, unspecified: Secondary | ICD-10-CM | POA: Diagnosis not present

## 2024-01-30 DIAGNOSIS — M25562 Pain in left knee: Secondary | ICD-10-CM | POA: Insufficient documentation

## 2024-01-30 DIAGNOSIS — G8929 Other chronic pain: Secondary | ICD-10-CM | POA: Insufficient documentation

## 2024-01-30 DIAGNOSIS — G894 Chronic pain syndrome: Secondary | ICD-10-CM | POA: Diagnosis not present

## 2024-01-30 NOTE — Progress Notes (Signed)
 Subjective:    [name], male    DOB: January 30, 1951, 73 y.o.   MRN: [id number]  HPI  HPI  Jacob [name] is a 73 y.o. year old male  who  has a past medical history of Acute respiratory failure (HCC) (03/01/2018), Anemia, Arthritis, Chronic combined systolic and diastolic CHF (congestive heart failure) (HCC), Chronic lower back pain, Community acquired pneumonia (09/06/2013), COPD (chronic obstructive pulmonary disease) (HCC), Coronary atherosclerosis of native coronary artery, Drug abuse (HCC), ESRD (end stage renal disease) (HCC), Fall at home (10/2020), GERD (gastroesophageal reflux disease), GI bleeding, Glaucoma, Hepatitis B (1968), History of blood transfusion (03/01/2019), History of colon polyps, History of gout, History of kidney stones, HTN (hypertension), Human immunodeficiency virus (HIV) disease (HCC) (dx'd 1995), Hyperlipidemia, Ischemic cardiomyopathy, Muscle spasm, Myocardial infarction (HCC) (~ 2004/2005), Nocturia, Peripheral neuropathy, Pneumonia, SDH (subdural hematoma) (HCC), Syphilis, unspecified, Type II diabetes mellitus (HCC) (2004), Wears glasses, and Wears partial dentures.   They are presenting to PM&R clinic as a new patient for pain management evaluation. They were referred by for  treatment of chronic lower back pain.  Patient reports he has had back pain for few years.  He says that his lower back pain got really bad about 3 years ago.  Reports that one of his doctors had offered him back surgery but he declined at that time.  He had several ESI injections completed, Dr. Daisey Dryer?    Back pain is primarily axial.  Pain limits his activities.  He says he cannot walk very far, has trouble doing much around the house.  Has trouble sleeping due to the pain. He says he was started on oxycodone by his PCP and this was working well to keep his pain at a more tolerable level.  Pain is particularly bad after ESRD.  Patient reports history of bilateral knee OA, history  of gout.  He reports occasional burning in his feet, occasional altered sensation.  Patient has a history of cocaine use, reports has not used in quite a few years.  Red flag symptoms: No red flags for back pain endorsed in Hx or ROS  Medications tried: Topical medications  Doesn't help his pain Nsaids Medical issues prevent use  Tylenol   Doesn't help much  Opiates  Oxycodone- helps his pain  Gabapentin- Makes sleepy, doesn't help pain TCAs - Denies  SNRIs - Denies   Other treatments: PT - Didn't help much, months ago   TENs unit- denies Injections ESI reported- didn't help? L knee cortisone- did help Surgery Denies   Prior UDS results:     Component Value Date/Time   LABOPIA NONE DETECTED 02/12/2021 2322   COCAINSCRNUR NONE DETECTED 02/12/2021 2322   COCAINSCRNUR NEGATIVE 09/07/2013 0122   LABBENZ NONE DETECTED 02/12/2021 2322   LABBENZ NEGATIVE 09/07/2013 0122   AMPHETMU NONE DETECTED 02/12/2021 2322   THCU NONE DETECTED 02/12/2021 2322   LABBARB NONE DETECTED 02/12/2021 2322        Pain Inventory Average Pain 7 Pain Right Now 0 My pain is intermittent, sharp, burning, and tingling  In the last 24 hours, has pain interfered with the following? General activity 6 Relation with others 6 Enjoyment of life 6 What TIME of day is your pain at its worst? morning , evening, and night Sleep (in general) Poor  Pain is worse with: walking and standing Pain improves with: rest and medication Relief from Meds: 6  use a cane use a walker how many minutes can you  walk? unknown ability to climb steps?  no Do you have any goals in this area?  no  retired I need assistance with the following:  household duties Do you have any goals in this area?  yes  trouble walking  Any changes since last visit?  no  Any changes since last visit?  no    Family History  Problem Relation Age of Onset   Heart failure Father    Hypertension Father    Diabetes Brother     Heart attack Brother    Alzheimer's disease Mother    Stroke Sister    Diabetes Sister    Alzheimer's disease Sister    Hypertension Brother    Diabetes Brother    Drug abuse Brother    Colon cancer Neg Hx    Social History   Socioeconomic History   Marital status: Single    Spouse name: Not on file   Number of children: 1   Years of education: Not on file   Highest education level: Not on file  Occupational History   Occupation: retired  Tobacco Use   Smoking status: Every Day    Current packs/day: 0.50    Average packs/day: 0.5 packs/day for 43.0 years (21.5 ttl pk-yrs)    Types: Cigarettes    Passive exposure: Never   Smokeless tobacco: Never  Vaping Use   Vaping status: Never Used  Substance and Sexual Activity   Alcohol use: Not Currently    Alcohol/week: 12.0 standard drinks of alcohol    Types: 12 Standard drinks or equivalent per week    Comment: occassional   Drug use: Not Currently    Types: Cocaine    Comment: hx of crack/cocaine 81yrs ago 10/01/2019- none   Sexual activity: Yes    Partners: Male    Comment: Declined condoms  Other Topics Concern   Not on file  Social History Narrative   Retired.       As of 05/2015:   Diet: No salt    Caffeine   Married: Single    House: Condo, 2 stories, 1 person (self)    Pets: No    Current/Past profession: N/A   Exercise: walks daily    Living Will: Yes    DNR   POA/HPOA: No       Social Drivers of Corporate investment banker Strain: Low Risk  (10/26/2020)   Overall Financial Resource Strain (CARDIA)    Difficulty of Paying Living Expenses: Not hard at all  Food Insecurity: No Food Insecurity (06/14/2023)   Hunger Vital Sign    Worried About Running Out of Food in the Last Year: Never true    Ran Out of Food in the Last Year: Never true  Transportation Needs: No Transportation Needs (06/14/2023)   PRAPARE - Administrator, Civil Service (Medical): No    Lack of Transportation (Non-Medical): No   Physical Activity: Insufficiently Active (06/26/2018)   Exercise Vital Sign    Days of Exercise per Week: 7 days    Minutes of Exercise per Session: 20 min  Stress: No Stress Concern Present (06/26/2018)   [employer] of Occupational Health - Occupational Stress Questionnaire    Feeling of Stress : Only a little  Social Connections: Somewhat Isolated (06/26/2018)   Social Connection and Isolation Panel [NHANES]    Frequency of Communication with Friends and Family: Three times a week    Frequency of Social Gatherings with Friends and Family: Three times a  week    Attends Religious Services: More than 4 times per year    Active Member of Clubs or Organizations: No    Attends Banker Meetings: Never    Marital Status: Never married   Past Surgical History:  Procedure Laterality Date   A/V FISTULAGRAM N/A 12/07/2023   Procedure: A/V Fistulagram;  Surgeon: Baron Border, MD;  [facility]: [facility] INVASIVE [facility] LAB;  Service: Cardiovascular;  Laterality: N/A;   AV FISTULA PLACEMENT Left 08/02/2018   Procedure: ARTERIOVENOUS (AV) FISTULA CREATION  left arm radiocephlic;  Surgeon: Young Hensen, MD;  Location: [facility] OR;  Service: Vascular;  Laterality: Left;   AV FISTULA PLACEMENT Left 08/01/2019   Procedure: LEFT BRACHIOCEPHALIC ARTERIOVENOUS (AV) FISTULA CREATION;  Surgeon: Mayo Speck, MD;  Location: [facility] OR;  Service: Vascular;  Laterality: Left;   AV FISTULA PLACEMENT Right 04/12/2022   Procedure: RIGHT UPPER EXTREMITY ARTERIOVENOUS (AV)  GRAFT INSERTION USING GORE STRETCH 4-7 MM;  Surgeon: Dannis Dy, MD;  Location: [facility] OR;  Service: Vascular;  Laterality: Right;   BASCILIC VEIN TRANSPOSITION Left 10/03/2019   Procedure: BASILIC VEIN TRANSPOSITION LEFT SECOND STAGE;  Surgeon: Mayo Speck, MD;  Location: [facility] OR;  Service: Vascular;  Laterality: Left;   BIOPSY  01/25/2021   Procedure: BIOPSY;  Surgeon: Albertina Hugger, MD;  Location: [facility] ENDOSCOPY;  Service:  Gastroenterology;;   CARDIAC CATHETERIZATION  10/2002; 12/19/2004   Maximo Spar 03/08/2011   COLONOSCOPY  2013   Dr.John Elvin Hammer    CORONARY ARTERY BYPASS GRAFT  02/24/2003   CABG X2/notes 03/08/2011   CORONARY STENT INTERVENTION N/A 12/19/2019   Procedure: CORONARY STENT INTERVENTION;  Surgeon: Lucendia Rusk, MD;  [facility];  Service: Cardiovascular;  Laterality: N/A;   CORONARY ULTRASOUND/IVUS N/A 12/19/2019   Procedure: Intravascular Ultrasound/IVUS;  Surgeon: Lucendia Rusk, MD;  Location: [facility] INVASIVE CV LAB;  Service: Cardiovascular;  Laterality: N/A;   ENTEROSCOPY N/A 01/25/2021   Procedure: ENTEROSCOPY;  Surgeon: Albertina Hugger, MD;  Location: [facility] ENDOSCOPY;  Service: Gastroenterology;  Laterality: N/A;   ENTEROSCOPY N/A 02/13/2021   Procedure: ENTEROSCOPY;  Surgeon: Lajuan Pila, MD;  Location: [facility] ENDOSCOPY;  Service: Endoscopy;  Laterality: N/A;   ENTEROSCOPY N/A 05/07/2021   Procedure: ENTEROSCOPY;  Surgeon: Ace Holder, MD;  Location: [facility] ENDOSCOPY;  Service: Gastroenterology;  Laterality: N/A;   ESOPHAGOGASTRODUODENOSCOPY (EGD) WITH PROPOFOL N/A 02/08/2019   Procedure: ESOPHAGOGASTRODUODENOSCOPY (EGD) WITH PROPOFOL;  Surgeon: Janel Medford, MD;  Location: [facility] ENDOSCOPY;  Service: Gastroenterology;  Laterality: N/A;   ESOPHAGOGASTRODUODENOSCOPY (EGD) WITH PROPOFOL N/A 12/22/2019   Procedure: ESOPHAGOGASTRODUODENOSCOPY (EGD) WITH PROPOFOL;  Surgeon: Annis Kinder, DO;  [facility]: [facility] ENDOSCOPY;  Service: Gastroenterology;  Laterality: N/A;   ESOPHAGOGASTRODUODENOSCOPY (EGD) WITH PROPOFOL N/A 10/19/2020   Procedure: ESOPHAGOGASTRODUODENOSCOPY (EGD) WITH PROPOFOL;  Surgeon: Lajuan Pila, MD;  Location: [facility] ENDOSCOPY;  Service: Endoscopy;  Laterality: N/A;   ESOPHAGOGASTRODUODENOSCOPY (EGD) WITH PROPOFOL N/A 12/22/2020   Procedure: ESOPHAGOGASTRODUODENOSCOPY (EGD) WITH PROPOFOL;  Surgeon: Kenney Peacemaker, MD;  Location: [facility] ENDOSCOPY;  Service: Endoscopy;   Laterality: N/A;   ESOPHAGOGASTRODUODENOSCOPY (EGD) WITH PROPOFOL N/A 01/09/2021   Procedure: ESOPHAGOGASTRODUODENOSCOPY (EGD) WITH PROPOFOL;  Surgeon: Tobin Forts, MD;  Location: [facility] ENDOSCOPY;  Service: Endoscopy;  Laterality: N/A;   HEMOSTASIS CLIP PLACEMENT  12/22/2019   Procedure: HEMOSTASIS CLIP PLACEMENT;  Surgeon: Annis Kinder, DO;  Location: [facility] ENDOSCOPY;  Service: Gastroenterology;;   HEMOSTASIS CLIP PLACEMENT  12/22/2020   Procedure: HEMOSTASIS CLIP  PLACEMENT;  Surgeon: Kenney Peacemaker, MD;  Location: [facility] ENDOSCOPY;  Service: Endoscopy;;   HEMOSTASIS CONTROL  12/22/2020   Procedure: HEMOSTASIS CONTROL/hemospray;  Surgeon: Kenney Peacemaker, MD;  Location: [facility] ENDOSCOPY;  Service: Endoscopy;;   HOT HEMOSTASIS N/A 02/08/2019   Procedure: HOT HEMOSTASIS (ARGON PLASMA COAGULATION/BICAP);  Surgeon: Janel Medford, MD;  Location: [facility] ENDOSCOPY;  Service: Gastroenterology;  Laterality: N/A;   HOT HEMOSTASIS N/A 12/22/2019   Procedure: HOT HEMOSTASIS (ARGON PLASMA COAGULATION/BICAP);  Surgeon: Annis Kinder, DO;  Location: [facility] ENDOSCOPY;  Service: Gastroenterology;  Laterality: N/A;   HOT HEMOSTASIS N/A 10/19/2020   Procedure: HOT HEMOSTASIS (ARGON PLASMA COAGULATION/BICAP);  Surgeon: Lajuan Pila, MD;  Location: [facility] ENDOSCOPY;  Service: Endoscopy;  Laterality: N/A;   HOT HEMOSTASIS N/A 12/22/2020   Procedure: HOT HEMOSTASIS (ARGON PLASMA COAGULATION/BICAP);  Surgeon: Kenney Peacemaker, MD;  Location: [facility] ENDOSCOPY;  Service: Endoscopy;  Laterality: N/A;   HOT HEMOSTASIS N/A 01/09/2021   Procedure: HOT HEMOSTASIS (ARGON PLASMA COAGULATION/BICAP);  Surgeon: Tobin Forts, MD;  Location: [facility] ENDOSCOPY;  Service: Endoscopy;  Laterality: N/A;   HOT HEMOSTASIS N/A 01/25/2021   Procedure: HOT HEMOSTASIS (ARGON PLASMA COAGULATION/BICAP);  Surgeon: Albertina Hugger, MD;  Location: [facility] ENDOSCOPY;  Service: Gastroenterology;  Laterality: N/A;   HOT HEMOSTASIS N/A 02/13/2021   Procedure: HOT HEMOSTASIS (ARGON  PLASMA COAGULATION/BICAP);  Surgeon: Lajuan Pila, MD;  Location: [facility] ENDOSCOPY;  Service: Endoscopy;  Laterality: N/A;   HOT HEMOSTASIS N/A 05/07/2021   Procedure: HOT HEMOSTASIS (ARGON PLASMA COAGULATION/BICAP);  Surgeon: Ace Holder, MD;  Location: [facility] ENDOSCOPY;  Service: Gastroenterology;  Laterality: N/A;   INSERTION OF DIALYSIS CATHETER N/A 02/08/2022   Procedure: INSERTION OF TUNNELED DIALYSIS CATHETER;  Surgeon: Dannis Dy, MD;  Location: [facility] OR;  Service: Vascular;  Laterality: N/A;   INTERTROCHANTERIC HIP FRACTURE SURGERY Left 11/2006   Maximo Spar 03/08/2011   IR FLUORO GUIDE CV LINE LEFT  03/14/2022   IR FLUORO GUIDE CV LINE RIGHT  07/24/2019   IR FLUORO GUIDE CV LINE RIGHT  07/30/2019   IR US  GUIDE VASC ACCESS RIGHT  07/24/2019   IR US  GUIDE VASC ACCESS RIGHT  07/30/2019   LAPAROSCOPIC CHOLECYSTECTOMY  05/2006   LIGATION OF ARTERIOVENOUS  FISTULA Left 02/08/2022   Procedure: LIGATION OF LEFT ARTERIOVENOUS  FISTULA;  Surgeon: Dannis Dy, MD;  Location: [facility] OR;  Service: Vascular;  Laterality: Left;   LIGATION OF COMPETING BRANCHES OF ARTERIOVENOUS FISTULA Left 11/05/2018   Procedure: LIGATION OF COMPETING BRANCHES OF ARTERIOVENOUS FISTULA  LEFT  ARM;  Surgeon: Young Hensen, MD;  Location: [facility] OR;  Service: Vascular;  Laterality: Left;   LUMBAR LAMINECTOMY/DECOMPRESSION MICRODISCECTOMY N/A 02/29/2016   Procedure: Left L4-5 Lateral Recess Decompression, Removal Extradural Intraspinal Facet Cyst;  Surgeon: Adah Acron, MD;  Location: [facility] OR;  Service: Orthopedics;  Laterality: N/A;   METATARSAL HEAD EXCISION Right 07/20/2022   Procedure: Irragation and debridement of right foot wound with extraction of all necrotic soft tissue and bone;  Surgeon: Evertt Hoe, DPM;  Location: [facility] OR;  Service: Podiatry;  Laterality: Right;  Right 4th Met head and base of proximal phalanx resection   MULTIPLE TOOTH EXTRACTIONS     ORIF MANDIBULAR FRACTURE Left 08/13/2004    ORIF of left body fracture mandible with KLS Martin 2.3-mm six hole/notes 03/08/2011   RIGHT/LEFT HEART CATH AND CORONARY/GRAFT ANGIOGRAPHY N/A 12/19/2019   Procedure: RIGHT/LEFT HEART CATH AND CORONARY/GRAFT ANGIOGRAPHY;  Surgeon: Lucendia Rusk, MD;  Location: [facility]  INVASIVE CV LAB;  Service: Cardiovascular;  Laterality: N/A;   SCLEROTHERAPY  12/22/2019   Procedure: SCLEROTHERAPY;  Surgeon: Annis Kinder, DO;  Location: [facility] ENDOSCOPY;  Service: Gastroenterology;;   Daryle Eon  02/13/2021   Procedure: Daryle Eon;  Surgeon: Lajuan Pila, MD;  Location: [facility] ENDOSCOPY;  Service: Endoscopy;;   Past Medical History:  Diagnosis Date   Acute respiratory failure (HCC) 03/01/2018   Anemia    Arthritis    "all over; mostly knees and back" (02/28/2018)   Chronic combined systolic and diastolic CHF (congestive heart failure) (HCC)    Chronic lower back pain    stenosis   Community acquired pneumonia 09/06/2013   COPD (chronic obstructive pulmonary disease) (HCC)    Coronary atherosclerosis of native coronary artery    a. 02/2003 s/p CABG x 2 (VG->RI, VG->RPDA; b. 11/2019 PCI: LM nl, LAD 90d, D3 50, RI 100, LCX 100p, OM3 100 - fills via L->L collats from D2/dLAD, RCA 100p, VG->RPDA ok, VG->RI 95 (3.5x48 Synergy XD DES).   Drug abuse (HCC)    hx; tested for cocaine as recently as 2/08. says he is not using drugs now - avoided defib. for this reason    ESRD (end stage renal disease) (HCC)    Hemo M-W-F- [facility]   Fall at home 10/2020   GERD (gastroesophageal reflux disease)    takes OTC meds as needed   GI bleeding    a. 11/2019 EGD: angiodysplastic lesions w/ bleeding s/p argon plasma/clipping/epi inj. Multiple admissions for the same.   Glaucoma    uses eye drops daily   Hepatitis B 1968   "tx'd w/isolation; caught it from toilet stools in gym"   History of blood transfusion 03/01/2019   History of colon polyps    benign   History of gout    takes Allopurinol daily as well as  Colchicine-if needed (02/28/2018)   History of kidney stones    HTN (hypertension)    takes Coreg,Imdur.and Apresoline daily   Human immunodeficiency virus (HIV) disease (HCC) dx'd 1995   on Biktarvy as of 12/2020.     Hyperlipidemia    Ischemic cardiomyopathy    a. 01/2019 Echo: EF 40-45%, diffuse HK, mild basal septal hypertrophy. Diast dysfxn. Nl RV size/fxn. Sev dil LA. Triv MR/TR/PR.   Muscle spasm    takes Zanaflex as needed   Myocardial infarction (HCC) ~ 2004/2005   Nocturia    Peripheral neuropathy    takes gabapentin daily   Pneumonia    "at least twice" (02/28/2018)   SDH (subdural hematoma) (HCC)    Syphilis, unspecified    Type II diabetes mellitus (HCC) 2004   Lantus daily.Average fasting blood sugar 125-199   Wears glasses    Wears partial dentures    There were no vitals taken for this visit.  Opioid Risk Score:   Fall Risk Score:  `1  Depression screen [facility] 2/9     01/30/2024   10:18 AM 12/26/2023   12:49 PM 10/31/2023   10:34 AM 06/30/2023    1:20 PM 06/20/2023   11:03 AM 02/14/2023   12:56 PM 09/08/2022   10:04 AM  Depression screen PHQ 2/9  Decreased Interest 0 0 0 0 0 0 2  Down, Depressed, Hopeless 0 0 0 0 0 0 1  PHQ - 2 Score 0 0 0 0 0 0 3  Altered sleeping 0      2  Tired, decreased energy 0      3  Change in  appetite 0      3  Feeling bad or failure about yourself  0      1  Trouble concentrating 0      1  Moving slowly or fidgety/restless 0      3  Suicidal thoughts 0      0  PHQ-9 Score 0      16  Difficult doing work/chores       Somewhat difficult    Review of Systems  Constitutional:        Weight loss  Respiratory:  Positive for shortness of breath.   Gastrointestinal:  Positive for constipation.  Musculoskeletal:  Positive for gait problem.       Pain on right side from axiliary down to right foot  Skin:  Positive for rash.  All other systems reviewed and are negative.      Objective:   Physical Exam  Gen: no distress, normal  appearing HEENT: oral mucosa pink and moist, NCAT Chest: normal effort, normal rate of breathing Abd: soft, non-distended Ext: no edema Psych: pleasant, normal affect Skin: Wound on right lower extremity wrapped in gauze Neuro: Alert and awake, follows commands, cranial nerves II through XII grossly intact, normal speech and language RUE: 5/5 Deltoid, 5/5 Biceps, 5/5 Triceps, 5/5 Wrist Ext, 5/5 Grip LUE: 5/5 Deltoid, 5/5 Biceps, 5/5 Triceps, 5/5 Wrist Ext, 5/5 Grip RLE: HF 5/5, KE 5/5, ADF 5/5, APF 5/5 LLE: HF 5/5, KE 5/5, ADF 5/5, APF 5/5 Sensory exam normal for light touch and pain in all 4 limbs. No limb ataxia or cerebellar signs. No abnormal tone appreciated.  No abnormal tone noted Musculoskeletal:  Ambulating without assistive device, decreased step length.  No unsteadiness noted Minimal TTP bilateral lumbar spine today-reports today to be day FABER and FADIR negative bilaterally SLR negative bilaterally Facet loading negative, No significant increase of pain with spinal extension today No significant C-spine TTP TTP bilateral knee joint line-mild  MRI L spine 03/23/2021 Disc levels:   T11-12 and T12-L1: Normal   L1-2 and L2-3: Minimal disc bulges.  No stenosis.   L3-4: Minimal disc bulge. Mild facet and ligamentous hypertrophy. Mild narrowing of the lateral recesses but no apparent neural compression. The facet arthritis could contribute to low back pain. The facet arthritis has worsened slightly since 2017.   L4-5: Bilateral facet degeneration with facet and ligamentous hypertrophy. 5 mm of anterolisthesis, increased from about 3 mm in 2017. Worsened disc degeneration with broad-based herniation of the disc. Multifactorial spinal stenosis at this level that could cause neural compression on either or both sides. Bilateral foraminal stenosis could affect the exiting L4 nerves as well. Synovial cyst previously seen associated with the facet on the left is not  clearly recognizable.   L5-S1: Disc degeneration with loss of disc height. Endplate osteophytes and shallow protrusion of the disc. Facet degeneration and hypertrophy. Stenosis of both subarticular lateral recesses and neural foramina could cause neural compression on either or both sides. Findings have worsened since the previous exam.   IMPRESSION: L3-4: Mild bulging of the disc. Facet degeneration and hypertrophy which is progressive since 2017. No sign of neural compressive stenosis. The facet arthritis could contribute to low back pain.   L4-5: Worsening of disease at this level. Bilateral facet degeneration and hypertrophy with anterolisthesis of 5 mm, increased from 3 mm previously. Broad-based herniation of the disc. Stenosis of the canal, lateral recesses and neural foramina could cause neural compression on either or both sides. Discogenic endplate  marrow changes could relate to low back pain. The facet arthropathy could relate to low back pain.   L5-S1: Worsening of disease at this level. Disc degeneration with loss of disc height, endplate osteophytes and shallow protrusion of the disc. Facet degeneration and hypertrophy. Stenosis of the subarticular lateral recesses and neural foramina that could cause neural compression on either or both sides. Endplate edematous changes could relate to low back pain.      Assessment & Plan:  1) Chronic lower back pain, primarily axial  - MRI with evidence of spinal stenosis and multilevel lumbar spondylosis particularly at L3-4, L4-5, L5-S1 2) Bilateral knee osteoarthritis 3) Chronic pain syndrome 4)ESRD on hemodialysis 5)Chronic combined systolic and diastolic CHF 6)CAD with history of CABG 7)Polyneuropathy due to diabetes mellitus type 2 8)Gout, on allopurinol 9)History of substance abuse, cocaine  - Will need close follow-up and monitoring of random UDS  Continue gabapentin current dose -Food for pain discussed,list  provided -Discussed trying TENS unit -Poor results with nonopioid medications, medication options also limited by his chronic medical conditions -Opiate risk tool moderate -Consider facet joint injections, imaging with evidence of facet joint disease and lower back pain that is consistent with facet joint mediated pain -Drug screen today, consider continuation of Percocet 5 mg 3 times daily as needed -Consider facet joint injetions

## 2024-01-31 ENCOUNTER — Encounter: Payer: Self-pay | Admitting: Physical Medicine & Rehabilitation

## 2024-01-31 DIAGNOSIS — R197 Diarrhea, unspecified: Secondary | ICD-10-CM | POA: Diagnosis not present

## 2024-01-31 DIAGNOSIS — D688 Other specified coagulation defects: Secondary | ICD-10-CM | POA: Diagnosis not present

## 2024-01-31 DIAGNOSIS — D631 Anemia in chronic kidney disease: Secondary | ICD-10-CM | POA: Diagnosis not present

## 2024-01-31 DIAGNOSIS — E1122 Type 2 diabetes mellitus with diabetic chronic kidney disease: Secondary | ICD-10-CM | POA: Diagnosis not present

## 2024-01-31 DIAGNOSIS — N2581 Secondary hyperparathyroidism of renal origin: Secondary | ICD-10-CM | POA: Diagnosis not present

## 2024-01-31 DIAGNOSIS — N186 End stage renal disease: Secondary | ICD-10-CM | POA: Diagnosis not present

## 2024-01-31 DIAGNOSIS — Z992 Dependence on renal dialysis: Secondary | ICD-10-CM | POA: Diagnosis not present

## 2024-02-02 DIAGNOSIS — E1122 Type 2 diabetes mellitus with diabetic chronic kidney disease: Secondary | ICD-10-CM | POA: Diagnosis not present

## 2024-02-02 DIAGNOSIS — D631 Anemia in chronic kidney disease: Secondary | ICD-10-CM | POA: Diagnosis not present

## 2024-02-02 DIAGNOSIS — Z992 Dependence on renal dialysis: Secondary | ICD-10-CM | POA: Diagnosis not present

## 2024-02-02 DIAGNOSIS — N186 End stage renal disease: Secondary | ICD-10-CM | POA: Diagnosis not present

## 2024-02-02 DIAGNOSIS — R197 Diarrhea, unspecified: Secondary | ICD-10-CM | POA: Diagnosis not present

## 2024-02-02 DIAGNOSIS — D688 Other specified coagulation defects: Secondary | ICD-10-CM | POA: Diagnosis not present

## 2024-02-02 DIAGNOSIS — N2581 Secondary hyperparathyroidism of renal origin: Secondary | ICD-10-CM | POA: Diagnosis not present

## 2024-02-02 LAB — DRUG TOX MONITOR 1 W/CONF, ORAL FLD
Amphetamines: NEGATIVE ng/mL (ref <10)
Barbiturates: NEGATIVE ng/mL (ref <10)
Benzodiazepines: NEGATIVE ng/mL (ref <0.50)
Buprenorphine: NEGATIVE ng/mL (ref <0.10)
Cocaine: NEGATIVE ng/mL (ref <5.0)
Codeine: NEGATIVE ng/mL (ref <2.5)
Cotinine: 21 ng/mL — ABNORMAL HIGH (ref <5.0)
Dihydrocodeine: NEGATIVE ng/mL (ref <2.5)
Fentanyl: NEGATIVE ng/mL (ref <0.10)
Heroin Metabolite: NEGATIVE ng/mL (ref <1.0)
Hydrocodone: NEGATIVE ng/mL (ref <2.5)
Hydromorphone: NEGATIVE ng/mL (ref <2.5)
MARIJUANA: NEGATIVE ng/mL (ref <2.5)
MDMA: NEGATIVE ng/mL (ref <10)
Meprobamate: NEGATIVE ng/mL (ref <2.5)
Methadone: NEGATIVE ng/mL (ref <5.0)
Morphine: NEGATIVE ng/mL (ref <2.5)
Nicotine Metabolite: POSITIVE ng/mL — AB (ref <5.0)
Norhydrocodone: NEGATIVE ng/mL (ref <2.5)
Noroxycodone: 10.3 ng/mL — ABNORMAL HIGH (ref <2.5)
Opiates: POSITIVE ng/mL — AB (ref <2.5)
Oxycodone: 102 ng/mL — ABNORMAL HIGH (ref <2.5)
Oxymorphone: 2.5 ng/mL — ABNORMAL HIGH (ref <2.5)
Phencyclidine: NEGATIVE ng/mL (ref <10)
Tapentadol: NEGATIVE ng/mL (ref <5.0)
Tramadol: NEGATIVE ng/mL (ref <5.0)
Zolpidem: NEGATIVE ng/mL (ref <5.0)

## 2024-02-02 LAB — DRUG TOX ALC METAB W/CON, ORAL FLD: Alcohol Metabolite: NEGATIVE ng/mL (ref <25)

## 2024-02-02 NOTE — Telephone Encounter (Signed)
 Patient spoke to [place]/CMA and issue was resolved. Refer to other telephone encounter 01/25/2024.

## 2024-02-05 DIAGNOSIS — N2581 Secondary hyperparathyroidism of renal origin: Secondary | ICD-10-CM | POA: Diagnosis not present

## 2024-02-05 DIAGNOSIS — R197 Diarrhea, unspecified: Secondary | ICD-10-CM | POA: Diagnosis not present

## 2024-02-05 DIAGNOSIS — E1122 Type 2 diabetes mellitus with diabetic chronic kidney disease: Secondary | ICD-10-CM | POA: Diagnosis not present

## 2024-02-05 DIAGNOSIS — D631 Anemia in chronic kidney disease: Secondary | ICD-10-CM | POA: Diagnosis not present

## 2024-02-05 DIAGNOSIS — N186 End stage renal disease: Secondary | ICD-10-CM | POA: Diagnosis not present

## 2024-02-05 DIAGNOSIS — D688 Other specified coagulation defects: Secondary | ICD-10-CM | POA: Diagnosis not present

## 2024-02-05 DIAGNOSIS — Z992 Dependence on renal dialysis: Secondary | ICD-10-CM | POA: Diagnosis not present

## 2024-02-06 ENCOUNTER — Other Ambulatory Visit: Payer: Self-pay

## 2024-02-06 ENCOUNTER — Telehealth: Payer: Self-pay | Admitting: Physical Medicine & Rehabilitation

## 2024-02-06 MED ORDER — OXYCODONE-ACETAMINOPHEN 5-325 MG PO TABS: 1.0000 | ORAL_TABLET | Freq: Three times a day (TID) | ORAL | 0 refills | Status: DC | PRN

## 2024-02-06 NOTE — Telephone Encounter (Signed)
 Patient came to office asking about refill on oxycodone. He could not remember what you told him as to when he would get it. He will be out tomorrow. Labs look completed

## 2024-02-06 NOTE — Telephone Encounter (Signed)
 Patient needing a refill on oxycodone.

## 2024-02-06 NOTE — Progress Notes (Signed)
 Specialty Pharmacy Refill Coordination Note  Jacob [name] is a 73 y.o. male contacted today regarding refills of specialty medication(s) Octreotide Acetate (SandoSTATIN [place])   Patient requested Courier to Provider Office   Delivery date: 02/21/24   Verified address: [facility] [address] [place], [place], [address]   Medication will be filled on 04.29.25.

## 2024-02-07 DIAGNOSIS — D688 Other specified coagulation defects: Secondary | ICD-10-CM | POA: Diagnosis not present

## 2024-02-07 DIAGNOSIS — D631 Anemia in chronic kidney disease: Secondary | ICD-10-CM | POA: Diagnosis not present

## 2024-02-07 DIAGNOSIS — Z992 Dependence on renal dialysis: Secondary | ICD-10-CM | POA: Diagnosis not present

## 2024-02-07 DIAGNOSIS — E1122 Type 2 diabetes mellitus with diabetic chronic kidney disease: Secondary | ICD-10-CM | POA: Diagnosis not present

## 2024-02-07 DIAGNOSIS — N2581 Secondary hyperparathyroidism of renal origin: Secondary | ICD-10-CM | POA: Diagnosis not present

## 2024-02-07 DIAGNOSIS — N186 End stage renal disease: Secondary | ICD-10-CM | POA: Diagnosis not present

## 2024-02-07 DIAGNOSIS — R197 Diarrhea, unspecified: Secondary | ICD-10-CM | POA: Diagnosis not present

## 2024-02-08 ENCOUNTER — Encounter (HOSPITAL_BASED_OUTPATIENT_CLINIC_OR_DEPARTMENT_OTHER): Attending: Internal Medicine | Admitting: Internal Medicine

## 2024-02-08 DIAGNOSIS — Z992 Dependence on renal dialysis: Secondary | ICD-10-CM | POA: Diagnosis not present

## 2024-02-08 DIAGNOSIS — I43 Cardiomyopathy in diseases classified elsewhere: Secondary | ICD-10-CM | POA: Diagnosis not present

## 2024-02-08 DIAGNOSIS — Z794 Long term (current) use of insulin: Secondary | ICD-10-CM | POA: Insufficient documentation

## 2024-02-08 DIAGNOSIS — I132 Hypertensive heart and chronic kidney disease with heart failure and with stage 5 chronic kidney disease, or end stage renal disease: Secondary | ICD-10-CM | POA: Diagnosis not present

## 2024-02-08 DIAGNOSIS — Z21 Asymptomatic human immunodeficiency virus [HIV] infection status: Secondary | ICD-10-CM | POA: Insufficient documentation

## 2024-02-08 DIAGNOSIS — S31105A Unspecified open wound of abdominal wall, periumbilic region without penetration into peritoneal cavity, initial encounter: Secondary | ICD-10-CM | POA: Diagnosis not present

## 2024-02-08 DIAGNOSIS — N186 End stage renal disease: Secondary | ICD-10-CM | POA: Insufficient documentation

## 2024-02-08 DIAGNOSIS — E1122 Type 2 diabetes mellitus with diabetic chronic kidney disease: Secondary | ICD-10-CM | POA: Insufficient documentation

## 2024-02-08 DIAGNOSIS — E1151 Type 2 diabetes mellitus with diabetic peripheral angiopathy without gangrene: Secondary | ICD-10-CM | POA: Diagnosis not present

## 2024-02-08 DIAGNOSIS — L97818 Non-pressure chronic ulcer of other part of right lower leg with other specified severity: Secondary | ICD-10-CM | POA: Insufficient documentation

## 2024-02-08 DIAGNOSIS — I890 Lymphedema, not elsewhere classified: Secondary | ICD-10-CM | POA: Insufficient documentation

## 2024-02-08 DIAGNOSIS — I509 Heart failure, unspecified: Secondary | ICD-10-CM | POA: Diagnosis not present

## 2024-02-08 DIAGNOSIS — F17210 Nicotine dependence, cigarettes, uncomplicated: Secondary | ICD-10-CM | POA: Insufficient documentation

## 2024-02-08 DIAGNOSIS — S31105S Unspecified open wound of abdominal wall, periumbilic region without penetration into peritoneal cavity, sequela: Secondary | ICD-10-CM | POA: Insufficient documentation

## 2024-02-08 DIAGNOSIS — L97312 Non-pressure chronic ulcer of right ankle with fat layer exposed: Secondary | ICD-10-CM | POA: Diagnosis not present

## 2024-02-08 DIAGNOSIS — L97822 Non-pressure chronic ulcer of other part of left lower leg with fat layer exposed: Secondary | ICD-10-CM | POA: Diagnosis not present

## 2024-02-08 DIAGNOSIS — I87321 Chronic venous hypertension (idiopathic) with inflammation of right lower extremity: Secondary | ICD-10-CM | POA: Insufficient documentation

## 2024-02-08 DIAGNOSIS — X58XXXS Exposure to other specified factors, sequela: Secondary | ICD-10-CM | POA: Diagnosis not present

## 2024-02-09 DIAGNOSIS — Z992 Dependence on renal dialysis: Secondary | ICD-10-CM | POA: Diagnosis not present

## 2024-02-09 DIAGNOSIS — E1122 Type 2 diabetes mellitus with diabetic chronic kidney disease: Secondary | ICD-10-CM | POA: Diagnosis not present

## 2024-02-09 DIAGNOSIS — R197 Diarrhea, unspecified: Secondary | ICD-10-CM | POA: Diagnosis not present

## 2024-02-09 DIAGNOSIS — N186 End stage renal disease: Secondary | ICD-10-CM | POA: Diagnosis not present

## 2024-02-09 DIAGNOSIS — D688 Other specified coagulation defects: Secondary | ICD-10-CM | POA: Diagnosis not present

## 2024-02-09 DIAGNOSIS — N2581 Secondary hyperparathyroidism of renal origin: Secondary | ICD-10-CM | POA: Diagnosis not present

## 2024-02-09 DIAGNOSIS — D631 Anemia in chronic kidney disease: Secondary | ICD-10-CM | POA: Diagnosis not present

## 2024-02-12 DIAGNOSIS — D688 Other specified coagulation defects: Secondary | ICD-10-CM | POA: Diagnosis not present

## 2024-02-12 DIAGNOSIS — Z992 Dependence on renal dialysis: Secondary | ICD-10-CM | POA: Diagnosis not present

## 2024-02-12 DIAGNOSIS — N186 End stage renal disease: Secondary | ICD-10-CM | POA: Diagnosis not present

## 2024-02-12 DIAGNOSIS — E1122 Type 2 diabetes mellitus with diabetic chronic kidney disease: Secondary | ICD-10-CM | POA: Diagnosis not present

## 2024-02-12 DIAGNOSIS — D631 Anemia in chronic kidney disease: Secondary | ICD-10-CM | POA: Diagnosis not present

## 2024-02-12 DIAGNOSIS — R197 Diarrhea, unspecified: Secondary | ICD-10-CM | POA: Diagnosis not present

## 2024-02-12 DIAGNOSIS — N2581 Secondary hyperparathyroidism of renal origin: Secondary | ICD-10-CM | POA: Diagnosis not present

## 2024-02-13 ENCOUNTER — Ambulatory Visit: Payer: Self-pay

## 2024-02-13 NOTE — Patient Outreach (Signed)
 Complex Care Management   Visit Note  02/13/2024  Name:  Jacob [name] MRN: [id number] DOB: [birth year]  Situation: Referral received for Complex Care Management related to  Chronic Pain Syndrome; Non-healing Wounds  I obtained verbal consent from Patient.  Visit completed with patient  on the phone.  Background:   Past Medical History:  Diagnosis Date   Acute respiratory failure (HCC) 03/01/2018   Anemia    Arthritis    "all over; mostly knees and back" (02/28/2018)   Chronic combined systolic and diastolic CHF (congestive heart failure) (HCC)    Chronic lower back pain    stenosis   Community acquired pneumonia 09/06/2013   COPD (chronic obstructive pulmonary disease) (HCC)    Coronary atherosclerosis of native coronary artery    a. 02/2003 s/p CABG x 2 (VG->RI, VG->RPDA; b. 11/2019 PCI: LM nl, LAD 90d, D3 50, RI 100, LCX 100p, OM3 100 - fills via L->L collats from D2/dLAD, RCA 100p, VG->RPDA ok, VG->RI 95 (3.5x48 Synergy XD DES).   Drug abuse (HCC)    hx; tested for cocaine as recently as 2/08. says he is not using drugs now - avoided defib. for this reason    ESRD (end stage renal disease) (HCC)    [facility] M-W-F- [facility]   Fall at home 10/2020   GERD (gastroesophageal reflux disease)    takes OTC meds as needed   GI bleeding    a. 11/2019 EGD: angiodysplastic lesions w/ bleeding s/p argon plasma/clipping/epi inj. Multiple admissions for the same.   Glaucoma    uses eye drops daily   Hepatitis B 1968   "tx'd w/isolation; caught it from toilet stools in gym"   History of blood transfusion 03/01/2019   History of colon polyps    benign   History of gout    takes Allopurinol  daily as well as Colchicine -if needed (02/28/2018)   History of kidney stones    HTN (hypertension)    takes Coreg ,Imdur .and Apresoline  daily   Human immunodeficiency virus (HIV) disease (HCC) dx'd 1995   on Biktarvy  as of 12/2020.     Hyperlipidemia    Ischemic cardiomyopathy    a. 01/2019 Echo: EF 40-45%,  diffuse HK, mild basal septal hypertrophy. Diast dysfxn. Nl RV size/fxn. Sev dil LA. Triv MR/TR/PR.   Muscle spasm    takes Zanaflex  as needed   Myocardial infarction (HCC) ~ 2004/2005   Nocturia    Peripheral neuropathy    takes gabapentin  daily   Pneumonia    "at least twice" (02/28/2018)   SDH (subdural hematoma) (HCC)    Syphilis, unspecified    Type II diabetes mellitus (HCC) 2004   Lantus  daily.Average fasting blood sugar 125-199   Wears glasses    Wears partial dentures     Assessment: Patient Reported Symptoms:  Cognitive Cognitive Status: Alert and oriented to person, place, and time Cognitive/Intellectual Conditions Management [RPT]: None reported or documented in medical history or problem list   Health Maintenance Behaviors: Annual physical exam, Spiritual practice(s) Healing Pattern: Slow Health Facilitated by: Prayer/meditation, Rest, Stress management  Neurological Neurological Review of Symptoms: No symptoms reported    HEENT HEENT Symptoms Reported: No symptoms reported      Cardiovascular Cardiovascular Symptoms Reported: No symptoms reported Does patient have uncontrolled Hypertension?: No Cardiovascular Conditions: High blood cholesterol, Hypertension, Heart failure, Dysrhythmia, Cardiomyopathy Cardiovascular Management Strategies: Medication therapy, Routine screening, Fluid modification Cardiovascular Self-Management Outcome: 4 (good)  Respiratory Respiratory Symptoms Reported: No symptoms reported Respiratory Conditions: Shortness of breath,  COPD Respiratory Self-Management Outcome: 4 (good)  Endocrine Patient reports the following symptoms related to hypoglycemia or hyperglycemia : No symptoms reported Is patient diabetic?: Yes Is patient checking blood sugars at home?: Yes Endocrine Conditions: Diabetes Endocrine Management Strategies: Medication therapy, Diet modification, Routine screening Endocrine Self-Management Outcome: 4 (good)   Gastrointestinal Gastrointestinal Symptoms Reported: Constipation Gastrointestinal Conditions: Constipation Gastrointestinal Management Strategies: Medication therapy Gastrointestinal Self-Management Outcome: 4 (good) Nutrition Risk Screen (CP): Large or nonhealing wound, burn or pressure injury  Genitourinary Genitourinary Symptoms Reported: No symptoms reported Genitourinary Conditions: End-stage renal disease Genitourinary Management Strategies: Hemodialysis Hemodialysis Schedule: M,W,F at [facility] Last Treatment: 02/12/24 Genitourinary Self-Management Outcome: 4 (good)  Integumentary Integumentary Symptoms Reported: Wound Additional Integumentary Details: left lower venous leg ulcer; abdominal ulcer Skin Conditions: Wound Skin Management Strategies: Dressing changes, Routine screening, Medication therapy Skin Self-Management Outcome: 4 (good)  Musculoskeletal Musculoskelatal Symptoms Reviewed: Unsteady gait Musculoskeletal Conditions: Joint pain, Mobility limited, Unsteady gait Musculoskeletal Management Strategies: Medication therapy, Routine screening Musculoskeletal Self-Management Outcome: 4 (good) Falls in the past year?: No Number of falls in past year: 1 or less Was there an injury with Fall?: No Fall Risk Category Calculator: 0 Patient Fall Risk Level: Low Fall Risk Patient at Risk for Falls Due to: Impaired balance/gait Fall risk Follow up: Falls evaluation completed  Psychosocial Psychosocial Symptoms Reported: No symptoms reported   Major Change/Loss/Stressor/Fears (CP): Death of a loved one (lost sister and brother in Social worker) Techniques to [employer] with Loss/Stress/Change: Spiritual practice(s) Quality of Family Relationships: supportive, helpful Do you feel physically threatened by others?: No      02/13/2024   12:41 PM  Depression screen PHQ 2/9  Decreased Interest 0  Down, Depressed, Hopeless 0  PHQ - 2 Score 0     There were no vitals filed for this visit.  Medications Reviewed Today     Reviewed by Kaylene Pascal, RN (Registered Nurse) on 02/13/24 at 1206  Med List Status: <None>   Medication Order Taking? Sig Documenting Provider Last Dose Status Informant  acetaminophen  (TYLENOL ) 650 MG CR tablet [id number] Yes Take 1,300 mg by mouth 3 (three) times daily. [provider] Taking Active Self           Med Note Minerva Alvine Dec 14, 2020  1:02 PM)    albuterol  (PROVENTIL ) (2.5 MG/3ML) 0.083% nebulizer solution [id number] Yes Take 3 mLs (2.5 mg total) by nebulization every 6 (six) hours as needed for wheezing or shortness of breath. [name], Elijio [place], NP Taking Active Self  allopurinol  (ZYLOPRIM ) 100 MG tablet [id number] Yes TAKE 1 TABLET BY MOUTH DAILY [name] Gall, MD Taking Active Self  atorvastatin  (LIPITOR ) 10 MG tablet [id number] No TAKE 1 TABLET(10 MG) BY MOUTH DAILY.  Patient not taking: Reported on 02/13/2024   Gerald Kitty., NP Not Taking Active Self           Med Note (Juluis Fitzsimmons L   Tue Feb 13, 2024 11:56 AM) Patient stopped taking per Nephrologist due to having increased muscle cramps  BIKTARVY  50-200-25 MG TABS tablet [id number] Yes TAKE 1 TABLET BY MOUTH 1 TIME A DAY Lina Render, MD Taking Active   Calcium  Acetate 667 MG TABS [id number] Yes Take 667-2,001 mg by mouth See admin instructions. Take 2001 mg by mouth three times a day with meals and 667 mg with each snack [provider] Taking Active Self  carvedilol  (COREG ) 3.125 MG tablet [id number] Yes TAKE 1  TABLET(3.125 MG) BY MOUTH TWICE DAILY WITH A MEAL Valentina Gasman Karon Packer., NP Taking Active Self  clindamycin  (CLEOCIN ) 150 MG capsule [id number] Yes Take 150 mg by mouth every 6 (six) hours. [provider] Taking Active   diclofenac  Sodium (VOLTAREN ) 1 % GEL [id number] Yes Apply 2 grams topically to the affected area three times daily as needed for pain. Doreen Gamma, MD Taking  Active Self  folic acid  (FOLVITE ) 1 MG tablet [id number] Yes TAKE 1 TABLET(1 MG) BY MOUTH DAILY [name] Gall, MD Taking Active Self  gabapentin  (NEURONTIN ) 300 MG capsule [id number] Yes Take 300 mg by mouth in the morning and at bedtime. Patient is taking 1 tablet in the early evening after dialysis and 1 tablet at bedtime [provider] Taking Active   HUMALOG  KWIKPEN 100 UNIT/ML KwikPen [id number] Yes Inject 15 Units into the skin 3 (three) times daily. [name] Gall, MD Taking Active   insulin  degludec (TRESIBA ) 100 UNIT/ML FlexTouch Pen [id number] Yes Inject 15 Units into the skin daily. 3 [name] Gall, MD Taking Active   isosorbide  mononitrate (IMDUR ) 30 MG 24 hr tablet [id number] Yes Take 1 tablet (30 mg total) by mouth daily. Doreen Gamma, MD Taking Active Self  latanoprost  (XALATAN ) 0.005 % ophthalmic solution [id number] Yes Place 1 drop into both eyes at bedtime. [provider] Taking Active Self  leptospermum manuka honey (MEDIHONEY) PSTE paste [id number]  Apply 1 Application topically daily. Cleanse R lower leg wound with Vashe wound cleanser Timm Foot [id number]), do not rinse and allow to air dry. Apply Medihoney to wound bed daily, cover with dry gauze and cover with silicone foam or ABD pad and Kerlix roll gauze to secure.Interchangeable with TheraHoney Apply thin layer (3 mm) to wound. Hongalgi, Anand D, MD  Consider Medication Status and Discontinue (Change in therapy)   lidocaine -prilocaine  (EMLA ) cream [id number] Yes Apply topically as directed. At dialysis [provider] Taking Active Self  Menthol -Camphor (TIGER BALM ARTHRITIS RUB) 11-11 % CREA [id number] Yes Apply 1 Application topically as needed (pain). [provider] Taking Active Self  midodrine  (PROAMATINE ) 10 MG tablet [id number] Yes Take 10 mg by mouth 3 (three) times a week. [provider] Taking Active Self  multivitamin (RENA-VIT) TABS tablet [id number] Yes  Take 1 tablet by mouth daily. [provider] Taking Active Self  neomycin-bacitracin-polymyxin 3.5-4757146194 OINT [id number] Yes Apply 1 Application topically in the morning and at bedtime. [name] Gall, MD Taking Active   nitroGLYCERIN  (NITROSTAT ) 0.3 MG SL tablet [id number]  Place 0.3 mg under the tongue every 5 (five) minutes as needed for chest pain. [provider]  Active   octreotide  (SANDOSTATIN  LAR DEPOT) 10 MG injection [id number] Yes Inject 10 mg into the muscle every 28 days. Tobin Forts, MD Taking Active Self  oxyCODONE -acetaminophen  (PERCOCET) 5-325 MG tablet [id number] Yes Take 1 tablet by mouth every 8 (eight) hours as needed for severe pain (pain score 7-10). Lylia Sand, MD Taking Active   PREVIDENT 5000 PLUS 1.1 % CREA dental cream [id number] Yes Place 1 Application onto teeth at bedtime. [provider] Taking Active Self  simethicone  (MYLICON) 125 MG chewable tablet [id number]  Chew 125 mg by mouth as needed for flatulence. [provider]  Consider Medication Status and Discontinue (Patient Preference)   timolol  (TIMOPTIC ) 0.5 % ophthalmic solution [id number] Yes Place 1 drop into both eyes in the morning. [provider] Taking Active Self  umeclidinium-vilanterol (ANORO ELLIPTA ) 62.5-25 MCG/ACT AEPB [id number] Yes Inhale 1 puff into the  lungs daily. Doreen Gamma, MD Taking Active Self            Recommendation:   PCP Follow-up scheduled with Dr. Court Distance [place] scheduled for 02/22/24 @10 :10 AM Specialty provider follow-up with Dr. Adriane Albe for wound care scheduled for 02/20/24 @2 :00 PM; follow-up with Pain Management on 03/05/24 @10 :00 AM  Follow Up Plan:   Telephone follow up appointment date/time:  02/27/24 @10 :30 AM  Louanne Roussel RN BSN CCM [place]  [facility], [facility] Nurse Care Coordinator  Direct Dial : [phone] Website: [email or web address]

## 2024-02-13 NOTE — Patient Instructions (Signed)
 Visit Information  Thank you for taking time to visit with me today. Please don't hesitate to contact me if I can be of assistance to you before our next scheduled appointment.  Our next appointment is by telephone on 02/27/24 at 10:30 AM Please call the care guide team at [phone] if you need to cancel or reschedule your appointment.   Following is a copy of your care plan:   Goals Addressed               This Visit's Progress     Patient Stated     COMPLETED: I need help transferring to another dialysis center (pt-stated)        Care Coordination Interventions: Evaluation of current treatment plan related to ESRD and patient's adherence to plan as established by provider Determined patient remains to be on the wait list for an alternate dialysis center       Other     COMPLETED: To have ankle ulcerations heal without complications        Care Coordination Interventions: See new goal      COMPLETED: To improve diabetes and lower A1c        Care Coordination Interventions: Evaluation of current treatment plan related to type 2 diabetes and patient's adherence to plan as established by provider  Review of patient status, including review of consultant's reports, relevant laboratory and other test results, and medication reconcillation completed Assessed patient's understanding of A1c goal: <7% Lab Results  Component Value Date   HGBA1C 7.7 (H) 12/26/2023         COMPLETED: To keep chronic pain well managed        Care Coordination Interventions: See new goal       VBCI RN Care Plan related Chronic Pain Syndrome        Problems:  Chronic Disease Management support and education needs related to Chronic Pain Syndrome   Goal: Over the next 30 days the Patient will continue to work with RN Care Manager and/or Social Worker to address care management and care coordination needs related to Chronic Pain Syndrome   as evidenced by adherence to care management team scheduled  appointments      Interventions:   Pain Interventions: Pain assessment performed Medications reviewed Reviewed provider established plan for pain management Discussed importance of adherence to all scheduled medical appointments Counseled on the importance of reporting any/all new or changed pain symptoms or management strategies to pain management provider Reviewed with patient prescribed pharmacological and nonpharmacological pain relief strategies Screening for signs and symptoms of depression related to chronic disease state  Assessed social determinant of health barriers  Patient Self-Care Activities:  Attend all scheduled provider appointments Call pharmacy for medication refills 3-7 days in advance of running out of medications Call provider office for new concerns or questions  Take medications as prescribed   Work with the nurse care manager to address care coordination needs and will continue to work with the clinical team to address health care and disease management related needs  Plan:  Keep your next scheduled follow up with [facility], Dr. Lylia Sand, MD appointment scheduled for: 02/22/24 @10 :10 AM Telephone follow up appointment with care management team member scheduled for: 02/27/24 @10 :30 AM            VBCI RN Care Plan related to Wound Healing of ulcer of left lower extremity and abdomen        Problems:  Chronic Disease Management support and  education needs related to delayed wound healing of ulcer to LLE and abdomen  Goal: Over the next 90 days the Patient will demonstrate Improved health management independence as evidenced by patient will report wound healing without complications         Interventions:   Evaluation of current treatment plan related to  delayed healing from ulcer to left lower extremity and abdomen , self-management and patient's adherence to plan as established by provider Reviewed and discussed patient completed his  initial visit with the wound care doctor Reviewed and discussed with patient MD plan of care and self health management of ulcer to his left lower extremity and new ulcer noted to his abdomen Determined patient is able to repeat back the instructions provided by the wound care doctor  Reviewed and discussed patient's next scheduled visit with Dr. Adriane [name], for ongoing wound care scheduled for 02/20/24 @2 :00 PM Discussed plans with patient for ongoing care management follow up and provided patient with direct contact information for care management team   Patient Self-Care Activities:  Attend all scheduled provider appointments Call pharmacy for medication refills 3-7 days in advance of running out of medications Call provider office for new concerns or questions  Work with the nurse care manager to address care coordination needs and will continue to work with the clinical team to address health care and disease management related needs  Plan:  Keep your next scheduled appointment with Dr. Adriane [name] for wound care scheduled for 02/20/24 @2 :00 PM Telephone follow up appointment with care management team member scheduled for: 02/27/24 @10 :30 AM             Please call 1-800-273-TALK (toll free, 24 hour hotline) if you are experiencing a Mental Health or Behavioral Health Crisis or need someone to talk to.  The patient verbalized understanding of instructions, educational materials, and care plan provided today and DECLINED offer to receive copy of patient instructions, educational materials, and care plan.   Jacob Roussel RN BSN CCM [place]  [facility], [facility] Health Nurse Care Coordinator  Direct Dial : [phone] Website: [email or web address]

## 2024-02-14 DIAGNOSIS — E1122 Type 2 diabetes mellitus with diabetic chronic kidney disease: Secondary | ICD-10-CM | POA: Diagnosis not present

## 2024-02-14 DIAGNOSIS — Z992 Dependence on renal dialysis: Secondary | ICD-10-CM | POA: Diagnosis not present

## 2024-02-14 DIAGNOSIS — N2581 Secondary hyperparathyroidism of renal origin: Secondary | ICD-10-CM | POA: Diagnosis not present

## 2024-02-14 DIAGNOSIS — R197 Diarrhea, unspecified: Secondary | ICD-10-CM | POA: Diagnosis not present

## 2024-02-14 DIAGNOSIS — D688 Other specified coagulation defects: Secondary | ICD-10-CM | POA: Diagnosis not present

## 2024-02-14 DIAGNOSIS — N186 End stage renal disease: Secondary | ICD-10-CM | POA: Diagnosis not present

## 2024-02-14 DIAGNOSIS — D631 Anemia in chronic kidney disease: Secondary | ICD-10-CM | POA: Diagnosis not present

## 2024-02-15 ENCOUNTER — Encounter (HOSPITAL_COMMUNITY)

## 2024-02-16 DIAGNOSIS — E1122 Type 2 diabetes mellitus with diabetic chronic kidney disease: Secondary | ICD-10-CM | POA: Diagnosis not present

## 2024-02-16 DIAGNOSIS — R197 Diarrhea, unspecified: Secondary | ICD-10-CM | POA: Diagnosis not present

## 2024-02-16 DIAGNOSIS — N186 End stage renal disease: Secondary | ICD-10-CM | POA: Diagnosis not present

## 2024-02-16 DIAGNOSIS — N2581 Secondary hyperparathyroidism of renal origin: Secondary | ICD-10-CM | POA: Diagnosis not present

## 2024-02-16 DIAGNOSIS — D688 Other specified coagulation defects: Secondary | ICD-10-CM | POA: Diagnosis not present

## 2024-02-16 DIAGNOSIS — D631 Anemia in chronic kidney disease: Secondary | ICD-10-CM | POA: Diagnosis not present

## 2024-02-16 DIAGNOSIS — Z992 Dependence on renal dialysis: Secondary | ICD-10-CM | POA: Diagnosis not present

## 2024-02-19 DIAGNOSIS — N186 End stage renal disease: Secondary | ICD-10-CM | POA: Diagnosis not present

## 2024-02-19 DIAGNOSIS — D631 Anemia in chronic kidney disease: Secondary | ICD-10-CM | POA: Diagnosis not present

## 2024-02-19 DIAGNOSIS — E1122 Type 2 diabetes mellitus with diabetic chronic kidney disease: Secondary | ICD-10-CM | POA: Diagnosis not present

## 2024-02-19 DIAGNOSIS — Z992 Dependence on renal dialysis: Secondary | ICD-10-CM | POA: Diagnosis not present

## 2024-02-19 DIAGNOSIS — N2581 Secondary hyperparathyroidism of renal origin: Secondary | ICD-10-CM | POA: Diagnosis not present

## 2024-02-19 DIAGNOSIS — D688 Other specified coagulation defects: Secondary | ICD-10-CM | POA: Diagnosis not present

## 2024-02-19 DIAGNOSIS — R197 Diarrhea, unspecified: Secondary | ICD-10-CM | POA: Diagnosis not present

## 2024-02-20 ENCOUNTER — Other Ambulatory Visit: Payer: Self-pay

## 2024-02-20 ENCOUNTER — Encounter (HOSPITAL_BASED_OUTPATIENT_CLINIC_OR_DEPARTMENT_OTHER): Admitting: Internal Medicine

## 2024-02-20 DIAGNOSIS — Z794 Long term (current) use of insulin: Secondary | ICD-10-CM | POA: Diagnosis not present

## 2024-02-20 DIAGNOSIS — F17210 Nicotine dependence, cigarettes, uncomplicated: Secondary | ICD-10-CM | POA: Diagnosis not present

## 2024-02-20 DIAGNOSIS — I132 Hypertensive heart and chronic kidney disease with heart failure and with stage 5 chronic kidney disease, or end stage renal disease: Secondary | ICD-10-CM | POA: Diagnosis not present

## 2024-02-20 DIAGNOSIS — N186 End stage renal disease: Secondary | ICD-10-CM | POA: Diagnosis not present

## 2024-02-20 DIAGNOSIS — I87321 Chronic venous hypertension (idiopathic) with inflammation of right lower extremity: Secondary | ICD-10-CM

## 2024-02-20 DIAGNOSIS — Z992 Dependence on renal dialysis: Secondary | ICD-10-CM | POA: Diagnosis not present

## 2024-02-20 DIAGNOSIS — I43 Cardiomyopathy in diseases classified elsewhere: Secondary | ICD-10-CM | POA: Diagnosis not present

## 2024-02-20 DIAGNOSIS — E1151 Type 2 diabetes mellitus with diabetic peripheral angiopathy without gangrene: Secondary | ICD-10-CM | POA: Diagnosis not present

## 2024-02-20 DIAGNOSIS — I890 Lymphedema, not elsewhere classified: Secondary | ICD-10-CM | POA: Diagnosis not present

## 2024-02-20 DIAGNOSIS — S31105S Unspecified open wound of abdominal wall, periumbilic region without penetration into peritoneal cavity, sequela: Secondary | ICD-10-CM

## 2024-02-20 DIAGNOSIS — L97818 Non-pressure chronic ulcer of other part of right lower leg with other specified severity: Secondary | ICD-10-CM | POA: Diagnosis not present

## 2024-02-20 DIAGNOSIS — E1122 Type 2 diabetes mellitus with diabetic chronic kidney disease: Secondary | ICD-10-CM | POA: Diagnosis not present

## 2024-02-20 DIAGNOSIS — I509 Heart failure, unspecified: Secondary | ICD-10-CM | POA: Diagnosis not present

## 2024-02-21 DIAGNOSIS — D631 Anemia in chronic kidney disease: Secondary | ICD-10-CM | POA: Diagnosis not present

## 2024-02-21 DIAGNOSIS — N186 End stage renal disease: Secondary | ICD-10-CM | POA: Diagnosis not present

## 2024-02-21 DIAGNOSIS — Z992 Dependence on renal dialysis: Secondary | ICD-10-CM | POA: Diagnosis not present

## 2024-02-21 DIAGNOSIS — D688 Other specified coagulation defects: Secondary | ICD-10-CM | POA: Diagnosis not present

## 2024-02-21 DIAGNOSIS — N2581 Secondary hyperparathyroidism of renal origin: Secondary | ICD-10-CM | POA: Diagnosis not present

## 2024-02-21 DIAGNOSIS — E8779 Other fluid overload: Secondary | ICD-10-CM | POA: Diagnosis not present

## 2024-02-21 DIAGNOSIS — I120 Hypertensive chronic kidney disease with stage 5 chronic kidney disease or end stage renal disease: Secondary | ICD-10-CM | POA: Diagnosis not present

## 2024-02-21 DIAGNOSIS — E1122 Type 2 diabetes mellitus with diabetic chronic kidney disease: Secondary | ICD-10-CM | POA: Diagnosis not present

## 2024-02-21 DIAGNOSIS — R197 Diarrhea, unspecified: Secondary | ICD-10-CM | POA: Diagnosis not present

## 2024-02-22 ENCOUNTER — Ambulatory Visit (HOSPITAL_BASED_OUTPATIENT_CLINIC_OR_DEPARTMENT_OTHER): Admitting: Family Medicine

## 2024-02-22 ENCOUNTER — Inpatient Hospital Stay (HOSPITAL_COMMUNITY): Admission: RE | Admit: 2024-02-22 | Source: Ambulatory Visit

## 2024-02-22 ENCOUNTER — Encounter (HOSPITAL_BASED_OUTPATIENT_CLINIC_OR_DEPARTMENT_OTHER): Payer: Self-pay | Admitting: Family Medicine

## 2024-02-22 DIAGNOSIS — E1142 Type 2 diabetes mellitus with diabetic polyneuropathy: Secondary | ICD-10-CM

## 2024-02-22 DIAGNOSIS — I10 Essential (primary) hypertension: Secondary | ICD-10-CM | POA: Diagnosis not present

## 2024-02-22 DIAGNOSIS — Z794 Long term (current) use of insulin: Secondary | ICD-10-CM

## 2024-02-22 DIAGNOSIS — S81801A Unspecified open wound, right lower leg, initial encounter: Secondary | ICD-10-CM | POA: Diagnosis not present

## 2024-02-22 NOTE — Assessment & Plan Note (Signed)
 Most recent hemoglobin A1c was about 2 months ago and was 7.7.  Given patient's age and general underlying health, maintaining hemoglobin A1c less than 8.0% would likely be reasonable.  May consider targeting A1c less than 7.5, however will need to be cautious with potential hypoglycemia.

## 2024-02-22 NOTE — Patient Instructions (Signed)
   Medication Instructions:  Your physician recommends that you continue on your current medications as directed. Please refer to the Current Medication list given to you today. --If you need a refill on any your medications before your next appointment, please call your pharmacy first. If no refills are authorized on file call the office.--   Follow-Up: Your next appointment:   Your physician recommends that you schedule a follow-up appointment in: 2-3 month follow up  with Dr. de [place]  You will receive a text message or e-mail with a link to a survey about your care and experience with [place] today! We would greatly appreciate your feedback!   Thanks for letting us be apart of your health journey!!  Primary Care and Sports Medicine   Dr. Ceasar Mons [place]   We encourage you to activate your patient portal called "MyChart".  Sign up information is provided on this After Visit Summary.  MyChart is used to connect with patients for Virtual Visits (Telemedicine).  Patients are able to view lab/test results, encounter notes, upcoming appointments, etc.  Non-urgent messages can be sent to your provider as well. To learn more about what you can do with MyChart, please visit --  [email or web address].

## 2024-02-22 NOTE — Assessment & Plan Note (Signed)
 Recommend continued close follow-up with wound care clinic for management of wound.

## 2024-02-22 NOTE — Assessment & Plan Note (Signed)
 Blood pressure appropriate in office today, no adjustments needed in pharmacotherapy today.

## 2024-02-22 NOTE — Progress Notes (Signed)
 New Patient Office Visit  Subjective   [name], male    DOB: September 19, 1951  Age: 73 y.o. MRN: [id number]  CC:  Chief Complaint  Patient presents with   New Patient (Initial Visit)    New Patient cannot remember last pcp but its been awhile since seeing a primary care sees other providers in [place]     HPI Jacob [name] presents to establish care Last PCP - Dr. Nathaneil Bakes at [facility]  Leg wounds: does follow with wound care, most recent visit earlier this week, does have next appt scheduled. Bandages in place currently, also has home health aide assisting.  Sees PM&R related to management of chronic pain.  CKD on dialysis - MWF.   Reports taking insulin  14 units TID and 14 units of insulin  nightly. Last A1c at 7.7 a couple months ago. Does check BG, readings in the 100s, had one reading in 60s recently.  HTN: does follow with cardiology. Taking carvedilol , Imdur . Also takes atorvastatin  for HLD.  History of HIV: follows with ID, taking Biktarvy .  Patient is also followed by Care Coordination as [facility] patient.  Patient is originally from [name].  Outpatient Encounter Medications as of 02/22/2024  Medication Sig   acetaminophen  (TYLENOL ) 650 MG CR tablet Take 1,300 mg by mouth 3 (three) times daily.   albuterol  (PROVENTIL ) (2.5 MG/3ML) 0.083% nebulizer solution Take 3 mLs (2.5 mg total) by nebulization every 6 (six) hours as needed for wheezing or shortness of breath.   allopurinol  (ZYLOPRIM ) 100 MG tablet TAKE 1 TABLET BY MOUTH DAILY   atorvastatin  (LIPITOR ) 10 MG tablet TAKE 1 TABLET(10 MG) BY MOUTH DAILY.   BIKTARVY  50-200-25 MG TABS tablet TAKE 1 TABLET BY MOUTH 1 TIME A DAY   Calcium  Acetate 667 MG TABS Take 667-2,001 mg by mouth See admin instructions. Take 2001 mg by mouth three times a day with meals and 667 mg with each snack   carvedilol  (COREG ) 3.125 MG tablet TAKE 1 TABLET(3.125 MG) BY MOUTH TWICE DAILY WITH A MEAL   diclofenac  Sodium  (VOLTAREN ) 1 % GEL Apply 2 grams topically to the affected area three times daily as needed for pain.   gabapentin  (NEURONTIN ) 300 MG capsule Take 300 mg by mouth in the morning and at bedtime. Patient is taking 1 tablet in the early evening after dialysis and 1 tablet at bedtime   HUMALOG  KWIKPEN 100 UNIT/ML KwikPen Inject 15 Units into the skin 3 (three) times daily.   insulin  degludec (TRESIBA ) 100 UNIT/ML FlexTouch Pen Inject 15 Units into the skin daily. 3   isosorbide  mononitrate (IMDUR ) 30 MG 24 hr tablet Take 1 tablet (30 mg total) by mouth daily.   latanoprost  (XALATAN ) 0.005 % ophthalmic solution Place 1 drop into both eyes at bedtime.   leptospermum manuka honey (MEDIHONEY) PSTE paste Apply 1 Application topically daily. Cleanse R lower leg wound with Vashe wound cleanser Timm Foot [phone]), do not rinse and allow to air dry. Apply Medihoney to wound bed daily, cover with dry gauze and cover with silicone foam or ABD pad and Kerlix roll gauze to secure.Interchangeable with TheraHoney Apply thin layer (3 mm) to wound.   lidocaine -prilocaine  (EMLA ) cream Apply topically as directed. At dialysis   Menthol -Camphor (TIGER BALM ARTHRITIS RUB) 11-11 % CREA Apply 1 Application topically as needed (pain).   midodrine  (PROAMATINE ) 10 MG tablet Take 10 mg by mouth 3 (three) times a week.   multivitamin (RENA-VIT) TABS tablet Take 1 tablet  by mouth daily.   neomycin-bacitracin-polymyxin [phone] OINT Apply 1 Application topically in the morning and at bedtime.   nitroGLYCERIN  (NITROSTAT ) 0.3 MG SL tablet Place 0.3 mg under the tongue every 5 (five) minutes as needed for chest pain.   octreotide  (SANDOSTATIN  LAR DEPOT) 10 MG injection Inject 10 mg into the muscle every 28 days.   oxyCODONE -acetaminophen  (PERCOCET) 5-325 MG tablet Take 1 tablet by mouth every 8 (eight) hours as needed for severe pain (pain score 7-10).   polyethylene glycol (MIRALAX  / GLYCOLAX ) 17 g packet Take 17 g by mouth daily as  needed for moderate constipation.   PREVIDENT 5000 PLUS 1.1 % CREA dental cream Place 1 Application onto teeth at bedtime.   simethicone  (MYLICON) 125 MG chewable tablet Chew 125 mg by mouth as needed for flatulence.   timolol  (TIMOPTIC ) 0.5 % ophthalmic solution Place 1 drop into both eyes in the morning.   umeclidinium-vilanterol (ANORO ELLIPTA ) 62.5-25 MCG/ACT AEPB Inhale 1 puff into the lungs daily.   [DISCONTINUED] clindamycin  (CLEOCIN ) 150 MG capsule Take 150 mg by mouth every 6 (six) hours.   [DISCONTINUED] folic acid  (FOLVITE ) 1 MG tablet TAKE 1 TABLET(1 MG) BY MOUTH DAILY   No facility-administered encounter medications on file as of 02/22/2024.    Past Medical History:  Diagnosis Date   Acute respiratory failure (HCC) 03/01/2018   Anemia    Arthritis    "all over; mostly knees and back" (02/28/2018)   Chronic combined systolic and diastolic CHF (congestive heart failure) (HCC)    Chronic lower back pain    stenosis   Community acquired pneumonia 09/06/2013   COPD (chronic obstructive pulmonary disease) (HCC)    Coronary atherosclerosis of native coronary artery    a. 02/2003 s/p CABG x 2 (VG->RI, VG->RPDA; b. 11/2019 PCI: LM nl, LAD 90d, D3 50, RI 100, LCX 100p, OM3 100 - fills via L->L collats from D2/dLAD, RCA 100p, VG->RPDA ok, VG->RI 95 (3.5x48 Synergy XD DES).   Drug abuse (HCC)    hx; tested for cocaine as recently as 2/08. says he is not using drugs now - avoided defib. for this reason    ESRD (end stage renal disease) (HCC)    Hemo M-W-F- [facility]   Fall at home 10/2020   GERD (gastroesophageal reflux disease)    takes OTC meds as needed   GI bleeding    a. 11/2019 EGD: angiodysplastic lesions w/ bleeding s/p argon plasma/clipping/epi inj. Multiple admissions for the same.   Glaucoma    uses [name] drops daily   Hepatitis B 1968   "tx'd w/isolation; caught it from toilet stools in gym"   History of blood transfusion 03/01/2019   History of colon polyps    benign    History of gout    takes Allopurinol  daily as well as Colchicine -if needed (02/28/2018)   History of kidney stones    HTN (hypertension)    takes Coreg ,Imdur .and Apresoline  daily   Human immunodeficiency virus (HIV) disease (HCC) dx'd 1995   on Biktarvy  as of 12/2020.     Hyperlipidemia    Ischemic cardiomyopathy    a. 01/2019 Echo: EF 40-45%, diffuse HK, mild basal septal hypertrophy. Diast dysfxn. Nl RV size/fxn. Sev dil LA. Triv MR/TR/PR.   Muscle spasm    takes Zanaflex  as needed   Myocardial infarction (HCC) ~ 2004/2005   Nocturia    Peripheral neuropathy    takes gabapentin  daily   Pneumonia    "at least twice" (02/28/2018)   SDH (  subdural hematoma) (HCC)    Syphilis, unspecified    Type II diabetes mellitus (HCC) 2004   Lantus  daily.Average fasting blood sugar 125-199   Wears glasses    Wears partial dentures     Past Surgical History:  Procedure Laterality Date   A/V FISTULAGRAM N/A 12/07/2023   Procedure: A/V Fistulagram;  Surgeon: Baron Border, MD;  [facility]: [facility] INVASIVE CV LAB;  Service: Cardiovascular;  Laterality: N/A;   AV FISTULA PLACEMENT Left 08/02/2018   Procedure: ARTERIOVENOUS (AV) FISTULA CREATION  left arm radiocephlic;  Surgeon: Young Hensen, MD;  Location: [facility] OR;  Service: Vascular;  Laterality: Left;   AV FISTULA PLACEMENT Left 08/01/2019   Procedure: LEFT BRACHIOCEPHALIC ARTERIOVENOUS (AV) FISTULA CREATION;  Surgeon: Mayo Speck, MD;  Location: [facility] OR;  Service: Vascular;  Laterality: Left;   AV FISTULA PLACEMENT Right 04/12/2022   Procedure: RIGHT UPPER EXTREMITY ARTERIOVENOUS (AV)  GRAFT INSERTION USING GORE STRETCH 4-7 MM;  Surgeon: Dannis Dy, MD;  Location: [facility] OR;  Service: Vascular;  Laterality: Right;   BASCILIC VEIN TRANSPOSITION Left 10/03/2019   Procedure: BASILIC VEIN TRANSPOSITION LEFT SECOND STAGE;  Surgeon: Mayo Speck, MD;  Location: [facility] OR;  Service: Vascular;  Laterality: Left;   BIOPSY  01/25/2021   Procedure: BIOPSY;   Surgeon: Albertina Hugger, MD;  Location: [facility] ENDOSCOPY;  Service: Gastroenterology;;   CARDIAC CATHETERIZATION  10/2002; 12/19/2004   Maximo Spar 03/08/2011   COLONOSCOPY  2013   Dr.John Elvin Hammer    CORONARY ARTERY BYPASS GRAFT  02/24/2003   CABG X2/notes 03/08/2011   CORONARY STENT INTERVENTION N/A 12/19/2019   Procedure: CORONARY STENT INTERVENTION;  Surgeon: Lucendia Rusk, MD;  [facility]: [facility] INVASIVE CV LAB;  Service: Cardiovascular;  Laterality: N/A;   CORONARY ULTRASOUND/IVUS N/A 12/19/2019   Procedure: Intravascular Ultrasound/IVUS;  Surgeon: Lucendia Rusk, MD;  Location: [name] INVASIVE CV LAB;  Service: Cardiovascular;  Laterality: N/A;   ENTEROSCOPY N/A 01/25/2021   Procedure: ENTEROSCOPY;  Surgeon: Albertina Hugger, MD;  Location: [facility] ENDOSCOPY;  Service: Gastroenterology;  Laterality: N/A;   ENTEROSCOPY N/A 02/13/2021   Procedure: ENTEROSCOPY;  Surgeon: Lajuan Pila, MD;  Location: [facility] ENDOSCOPY;  Service: Endoscopy;  Laterality: N/A;   ENTEROSCOPY N/A 05/07/2021   Procedure: ENTEROSCOPY;  Surgeon: Ace Holder, MD;  Location: [facility] ENDOSCOPY;  Service: Gastroenterology;  Laterality: N/A;   ESOPHAGOGASTRODUODENOSCOPY (EGD) WITH PROPOFOL  N/A 02/08/2019   Procedure: ESOPHAGOGASTRODUODENOSCOPY (EGD) WITH PROPOFOL ;  Surgeon: Janel Medford, MD;  Location: [facility] ENDOSCOPY;  Service: Gastroenterology;  Laterality: N/A;   ESOPHAGOGASTRODUODENOSCOPY (EGD) WITH PROPOFOL  N/A 12/22/2019   Procedure: ESOPHAGOGASTRODUODENOSCOPY (EGD) WITH PROPOFOL ;  Surgeon: Annis Kinder, DO;  Location: [facility] ENDOSCOPY;  Service: Gastroenterology;  Laterality: N/A;   ESOPHAGOGASTRODUODENOSCOPY (EGD) WITH PROPOFOL  N/A 10/19/2020   Procedure: ESOPHAGOGASTRODUODENOSCOPY (EGD) WITH PROPOFOL ;  Surgeon: Lajuan Pila, MD;  Location: [facility] ENDOSCOPY;  Service: Endoscopy;  Laterality: N/A;   ESOPHAGOGASTRODUODENOSCOPY (EGD) WITH PROPOFOL  N/A 12/22/2020   Procedure: ESOPHAGOGASTRODUODENOSCOPY (EGD) WITH PROPOFOL ;  Surgeon:  Kenney Peacemaker, MD;  Location[facility] ENDOSCOPY;  Service: Endoscopy;  Laterality: N/A;   ESOPHAGOGASTRODUODENOSCOPY (EGD) WITH PROPOFOL  N/A 01/09/2021   Procedure: ESOPHAGOGASTRODUODENOSCOPY (EGD) WITH PROPOFOL ;  Surgeon: Tobin Forts, MD;  Location: [facility] ENDOSCOPY;  Service: Endoscopy;  Laterality: N/A;   HEMOSTASIS CLIP PLACEMENT  12/22/2019   Procedure: HEMOSTASIS CLIP PLACEMENT;  Surgeon: Annis Kinder, DO;  Location: [facility] ENDOSCOPY;  Service: Gastroenterology;;   HEMOSTASIS CLIP PLACEMENT  12/22/2020   Procedure: HEMOSTASIS CLIP PLACEMENT;  Surgeon: Kenney Peacemaker, MD;  Location: [facility] ENDOSCOPY;  Service: Endoscopy;;   HEMOSTASIS CONTROL  12/22/2020   Procedure: HEMOSTASIS CONTROL/hemospray;  Surgeon: Kenney Peacemaker, MD;  Location: [facility] ENDOSCOPY;  Service: Endoscopy;;   HOT HEMOSTASIS N/A 02/08/2019   Procedure: HOT HEMOSTASIS (ARGON PLASMA COAGULATION/BICAP);  Surgeon: Janel Medford, MD;  Location: [facility] ENDOSCOPY;  Service: Gastroenterology;  Laterality: N/A;   HOT HEMOSTASIS N/A 12/22/2019   Procedure: HOT HEMOSTASIS (ARGON PLASMA COAGULATION/BICAP);  Surgeon: Annis Kinder, DO;  Location: [facility] ENDOSCOPY;  Service: Gastroenterology;  Laterality: N/A;   HOT HEMOSTASIS N/A 10/19/2020   Procedure: HOT HEMOSTASIS (ARGON PLASMA COAGULATION/BICAP);  Surgeon: Lajuan Pila, MD;  Location: [facility] ENDOSCOPY;  Service: Endoscopy;  Laterality: N/A;   HOT HEMOSTASIS N/A 12/22/2020   Procedure: HOT HEMOSTASIS (ARGON PLASMA COAGULATION/BICAP);  Surgeon: Kenney Peacemaker, MD;  Location: [facility] ENDOSCOPY;  Service: Endoscopy;  Laterality: N/A;   HOT HEMOSTASIS N/A 01/09/2021   Procedure: HOT HEMOSTASIS (ARGON PLASMA COAGULATION/BICAP);  Surgeon: Tobin Forts, MD;  Location: [facility] ENDOSCOPY;  Service: Endoscopy;  Laterality: N/A;   HOT HEMOSTASIS N/A 01/25/2021   Procedure: HOT HEMOSTASIS (ARGON PLASMA COAGULATION/BICAP);  Surgeon: Albertina Hugger, MD;  Location: [facility] ENDOSCOPY;  Service: Gastroenterology;  Laterality: N/A;    HOT HEMOSTASIS N/A 02/13/2021   Procedure: HOT HEMOSTASIS (ARGON PLASMA COAGULATION/BICAP);  Surgeon: Lajuan Pila, MD;  Location: [facility] ENDOSCOPY;  Service: Endoscopy;  Laterality: N/A;   HOT HEMOSTASIS N/A 05/07/2021   Procedure: HOT HEMOSTASIS (ARGON PLASMA COAGULATION/BICAP);  Surgeon: Ace Holder, MD;  Location: [facility] ENDOSCOPY;  Service: Gastroenterology;  Laterality: N/A;   INSERTION OF DIALYSIS CATHETER N/A 02/08/2022   Procedure: INSERTION OF TUNNELED DIALYSIS CATHETER;  Surgeon: Dannis Dy, MD;  Location: [facility] OR;  Service: Vascular;  Laterality: N/A;   INTERTROCHANTERIC HIP FRACTURE SURGERY Left 11/2006   Maximo Spar 03/08/2011   IR FLUORO GUIDE CV LINE LEFT  03/14/2022   IR FLUORO GUIDE CV LINE RIGHT  07/24/2019   IR FLUORO GUIDE CV LINE RIGHT  07/30/2019   IR US  GUIDE VASC ACCESS RIGHT  07/24/2019   IR US  GUIDE VASC ACCESS RIGHT  07/30/2019   LAPAROSCOPIC CHOLECYSTECTOMY  05/2006   LIGATION OF ARTERIOVENOUS  FISTULA Left 02/08/2022   Procedure: LIGATION OF LEFT ARTERIOVENOUS  FISTULA;  Surgeon: Dannis Dy, MD;  Location: [facility] OR;  Service: Vascular;  Laterality: Left;   LIGATION OF COMPETING BRANCHES OF ARTERIOVENOUS FISTULA Left 11/05/2018   Procedure: LIGATION OF COMPETING BRANCHES OF ARTERIOVENOUS FISTULA  LEFT  ARM;  Surgeon: Young Hensen, MD;  Location: [facility] OR;  Service: Vascular;  Laterality: Left;   LUMBAR LAMINECTOMY/DECOMPRESSION MICRODISCECTOMY N/A 02/29/2016   Procedure: Left L4-5 Lateral Recess Decompression, Removal Extradural Intraspinal Facet Cyst;  Surgeon: Adah Acron, MD;  Location: [facility] OR;  Service: Orthopedics;  Laterality: N/A;   METATARSAL HEAD EXCISION Right 07/20/2022   Procedure: Irragation and debridement of right foot wound with extraction of all necrotic soft tissue and bone;  Surgeon: Evertt Hoe, DPM;  Location: [facility] OR;  Service: Podiatry;  Laterality: Right;  Right 4th Met head and base of proximal phalanx resection    MULTIPLE TOOTH EXTRACTIONS     ORIF MANDIBULAR FRACTURE Left 08/13/2004   ORIF of left body fracture mandible with KLS Martin 2.3-mm six hole/notes 03/08/2011   RIGHT/LEFT HEART CATH AND CORONARY/GRAFT ANGIOGRAPHY N/A 12/19/2019   Procedure: RIGHT/LEFT HEART CATH AND CORONARY/GRAFT ANGIOGRAPHY;  Surgeon: Lucendia Rusk, MD;  Location: [facility] INVASIVE CV  LAB;  Service: Cardiovascular;  Laterality: N/A;   SCLEROTHERAPY  12/22/2019   Procedure: SCLEROTHERAPY;  Surgeon: Annis Kinder, DO;  Location: [facility] ENDOSCOPY;  Service: Gastroenterology;;   Daryle Eon  02/13/2021   Procedure: Daryle Eon;  Surgeon: Lajuan Pila, MD;  Location: [name] ENDOSCOPY;  Service: Endoscopy;;    Family History  Problem Relation Age of Onset   Heart failure Father    Hypertension Father    Diabetes Brother    Heart attack Brother    Alzheimer's disease Mother    Stroke Sister    Diabetes Sister    Alzheimer's disease Sister    Hypertension Brother    Diabetes Brother    Drug abuse Brother    Colon cancer Neg Hx     Social History   Socioeconomic History   Marital status: Single    Spouse name: Not on file   Number of children: 1   Years of education: Not on file   Highest education level: Not on file  Occupational History   Occupation: retired  Tobacco Use   Smoking status: Every Day    Current packs/day: 0.50    Average packs/day: 0.5 packs/day for 43.0 years (21.5 ttl pk-yrs)    Types: Cigarettes    Passive exposure: Current   Smokeless tobacco: Never  Vaping Use   Vaping status: Never Used  Substance and Sexual Activity   Alcohol use: Not Currently    Alcohol/week: 12.0 standard drinks of alcohol    Types: 12 Standard drinks or equivalent per week    Comment: occassional   Drug use: Not Currently    Types: Cocaine    Comment: hx of crack/cocaine 88yrs ago 10/01/2019- none   Sexual activity: Yes    Partners: Male    Comment: Declined condoms  Other Topics Concern   Not on file   Social History Narrative   Retired.       As of 05/2015:   Diet: No salt    Caffeine    Married: Single    House: Condo, 2 stories, 1 person (self)    Pets: No    Current/Past profession: N/A   Exercise: walks daily    Living Will: Yes    DNR   POA/HPOA: No       Social Drivers of Corporate investment banker Strain: Low Risk  (10/26/2020)   Overall Financial Resource Strain (CARDIA)    Difficulty of Paying Living Expenses: Not hard at all  Food Insecurity: No Food Insecurity (02/13/2024)   Hunger Vital Sign    Worried About Running Out of Food in the Last Year: Never true    Ran Out of Food in the Last Year: Never true  Transportation Needs: No Transportation Needs (02/13/2024)   PRAPARE - Administrator, Civil Service (Medical): No    Lack of Transportation (Non-Medical): No  Physical Activity: Insufficiently Active (06/26/2018)   Exercise Vital Sign    Days of Exercise per Week: 7 days    Minutes of Exercise per Session: 20 min  Stress: No Stress Concern Present (02/13/2024)   [employer] of Occupational Health - Occupational Stress Questionnaire    Feeling of Stress : Not at all  Social Connections: Somewhat Isolated (06/26/2018)   Social Connection and Isolation Panel [NHANES]    Frequency of Communication with Friends and Family: Three times a week    Frequency of Social Gatherings with Friends and Family: Three times a week    Attends Religious Services: More  than 4 times per year    Active Member of Clubs or Organizations: No    Attends Banker Meetings: Never    Marital Status: Never married  Intimate Partner Violence: Not At Risk (02/13/2024)   Humiliation, Afraid, Rape, and Kick questionnaire    Fear of Current or Ex-Partner: No    Emotionally Abused: No    Physically Abused: No    Sexually Abused: No    Objective   BP 106/63 (BP Location: Left Arm, Patient Position: Sitting, Cuff Size: Normal)   Pulse 83   Ht 6\' 2"  (1.88 m)   Wt  196 lb 12.8 oz (89.3 kg)   SpO2 97%   BMI 25.27 kg/m   Physical Exam  73 year old male in no acute distress. Patient presents utilizing rollator walker for assistance with ambulation.  He does have bandages in place over lower extremity. Cardiovascular exam with regular rate and rhythm Lungs clear to auscultation bilaterally  Assessment & Plan:   Type 2 diabetes mellitus with diabetic polyneuropathy, with long-term current use of insulin  (HCC) Assessment & Plan: Most recent hemoglobin A1c was about 2 months ago and was 7.7.  Given patient's age and general underlying health, maintaining hemoglobin A1c less than 8.0% would likely be reasonable.  May consider targeting A1c less than 7.5, however will need to be cautious with potential hypoglycemia.   Primary hypertension Assessment & Plan: Blood pressure appropriate in office today, no adjustments needed in pharmacotherapy today.   Wound of right lower extremity, initial encounter Assessment & Plan: Recommend continued close follow-up with wound care clinic for management of wound.   Return in about 2 months (around 04/23/2024) for diabetes, hypertension.   Spent 54 minutes on this patient encounter, including preparation, chart review, face-to-face counseling with patient and coordination of care, and documentation of encounter   ___________________________________________ Aurore Redinger de [place], MD, ABFM, [facility] Primary Care and Sports Medicine [facility]

## 2024-02-23 ENCOUNTER — Encounter (HOSPITAL_COMMUNITY)

## 2024-02-23 DIAGNOSIS — N186 End stage renal disease: Secondary | ICD-10-CM | POA: Diagnosis not present

## 2024-02-23 DIAGNOSIS — D631 Anemia in chronic kidney disease: Secondary | ICD-10-CM | POA: Diagnosis not present

## 2024-02-23 DIAGNOSIS — Z992 Dependence on renal dialysis: Secondary | ICD-10-CM | POA: Diagnosis not present

## 2024-02-23 DIAGNOSIS — N2581 Secondary hyperparathyroidism of renal origin: Secondary | ICD-10-CM | POA: Diagnosis not present

## 2024-02-25 ENCOUNTER — Other Ambulatory Visit: Payer: Self-pay | Admitting: Cardiovascular Disease

## 2024-02-26 ENCOUNTER — Other Ambulatory Visit (HOSPITAL_BASED_OUTPATIENT_CLINIC_OR_DEPARTMENT_OTHER): Payer: Self-pay | Admitting: *Deleted

## 2024-02-26 ENCOUNTER — Telehealth (HOSPITAL_BASED_OUTPATIENT_CLINIC_OR_DEPARTMENT_OTHER): Payer: Self-pay | Admitting: *Deleted

## 2024-02-26 DIAGNOSIS — D631 Anemia in chronic kidney disease: Secondary | ICD-10-CM | POA: Diagnosis not present

## 2024-02-26 DIAGNOSIS — N2581 Secondary hyperparathyroidism of renal origin: Secondary | ICD-10-CM | POA: Diagnosis not present

## 2024-02-26 DIAGNOSIS — Z992 Dependence on renal dialysis: Secondary | ICD-10-CM | POA: Diagnosis not present

## 2024-02-26 DIAGNOSIS — N186 End stage renal disease: Secondary | ICD-10-CM | POA: Diagnosis not present

## 2024-02-26 NOTE — Telephone Encounter (Signed)
 Patient needs refill on nitroglycerin  0.3 mg and gabapentin  330 mg this has not been filled by you before. Please advise if you would like to refill medication

## 2024-02-27 ENCOUNTER — Ambulatory Visit: Payer: Self-pay

## 2024-02-27 ENCOUNTER — Ambulatory Visit (HOSPITAL_COMMUNITY)
Admission: RE | Admit: 2024-02-27 | Discharge: 2024-02-27 | Disposition: A | Discharge status: Home / Self Care | Source: Ambulatory Visit | Attending: Gastroenterology | Admitting: Gastroenterology

## 2024-02-27 DIAGNOSIS — Q2730 Arteriovenous malformation, site unspecified: Secondary | ICD-10-CM | POA: Diagnosis not present

## 2024-02-27 DIAGNOSIS — D649 Anemia, unspecified: Secondary | ICD-10-CM | POA: Insufficient documentation

## 2024-02-27 MED ORDER — OCTREOTIDE ACETATE 10 MG IM KIT
10.0000 mg | PACK | INTRAMUSCULAR | Status: DC
  Administered 2024-02-27: 10 mg via INTRAMUSCULAR
  Filled 2024-02-27: qty 1

## 2024-02-27 NOTE — Patient Outreach (Addendum)
 Complex Care Management   Visit Note  02/27/2024  Name:  Jacob [name] MRN: [id number] DOB: 02-Feb-1951  Situation: Referral received for Complex Care Management related to Heart Failure, ESRD, Diabetes with Complications, and left venous wound to LE, abdominal wound. I obtained verbal consent from Patient.  Visit completed with patient on the phone.  Background:   Past Medical History:  Diagnosis Date   Acute respiratory failure (HCC) 03/01/2018   Anemia    Arthritis    "all over; mostly knees and back" (02/28/2018)   Chronic combined systolic and diastolic CHF (congestive heart failure) (HCC)    Chronic lower back pain    stenosis   Community acquired pneumonia 09/06/2013   COPD (chronic obstructive pulmonary disease) (HCC)    Coronary atherosclerosis of native coronary artery    a. 02/2003 s/p CABG x 2 (VG->RI, VG->RPDA; b. 11/2019 PCI: LM nl, LAD 90d, D3 50, RI 100, LCX 100p, OM3 100 - fills via L->L collats from D2/dLAD, RCA 100p, VG->RPDA ok, VG->RI 95 (3.5x48 Synergy XD DES).   Drug abuse (HCC)    hx; tested for cocaine as recently as 2/08. says he is not using drugs now - avoided defib. for this reason    ESRD (end stage renal disease) (HCC)    [facility] M-W-F- [facility]   Fall at home 10/2020   GERD (gastroesophageal reflux disease)    takes OTC meds as needed   GI bleeding    a. 11/2019 EGD: angiodysplastic lesions w/ bleeding s/p argon plasma/clipping/epi inj. Multiple admissions for the same.   Glaucoma    uses eye drops daily   Hepatitis B 1968   "tx'd w/isolation; caught it from toilet stools in gym"   History of blood transfusion 03/01/2019   History of colon polyps    benign   History of gout    takes Allopurinol  daily as well as Colchicine -if needed (02/28/2018)   History of kidney stones    HTN (hypertension)    takes Coreg ,Imdur .and Apresoline  daily   Human immunodeficiency virus (HIV) disease (HCC) dx'd 1995   on Biktarvy  as of 12/2020.     Hyperlipidemia    Ischemic  cardiomyopathy    a. 01/2019 Echo: EF 40-45%, diffuse HK, mild basal septal hypertrophy. Diast dysfxn. Nl RV size/fxn. Sev dil LA. Triv MR/TR/PR.   Muscle spasm    takes Zanaflex  as needed   Myocardial infarction (HCC) ~ 2004/2005   Nocturia    Peripheral neuropathy    takes gabapentin  daily   Pneumonia    "at least twice" (02/28/2018)   SDH (subdural hematoma) (HCC)    Syphilis, unspecified    Type II diabetes mellitus (HCC) 2004   Lantus  daily.Average fasting blood sugar 125-199   Wears glasses    Wears partial dentures     Assessment: Patient Reported Symptoms:  Cognitive Cognitive Status: Alert and oriented to person, place, and time Cognitive/Intellectual Conditions Management [RPT]: None reported or documented in medical history or problem list   Health Maintenance Behaviors: Annual physical exam, Spiritual practice(s) Healing Pattern: Slow Health Facilitated by: Prayer/meditation, Stress management  Neurological Neurological Review of Symptoms: No symptoms reported    HEENT HEENT Symptoms Reported: Runny nose HEENT Self-Management Outcome: 4 (good)    Cardiovascular Cardiovascular Symptoms Reported: No symptoms reported Cardiovascular Conditions: Hypertension, Heart failure Cardiovascular Management Strategies: Medication therapy, Routine screening Cardiovascular Self-Management Outcome: 4 (good)  Respiratory Respiratory Symptoms Reported: No symptoms reported Respiratory Conditions: COPD Respiratory Self-Management Outcome: 4 (good)  Endocrine Patient  reports the following symptoms related to hypoglycemia or hyperglycemia : No symptoms reported Is patient diabetic?: Yes Is patient checking blood sugars at home?: Yes Endocrine Conditions: Diabetes Endocrine Management Strategies: Routine screening, Medication therapy, Diet modification Endocrine Self-Management Outcome: 4 (good)  Gastrointestinal Gastrointestinal Symptoms Reported: Change in appetite,  Constipation Gastrointestinal Conditions: Constipation Gastrointestinal Management Strategies: Medication therapy Gastrointestinal Self-Management Outcome: 4 (good) Nutrition Risk Screen (CP): Large or nonhealing wound, burn or pressure injury  Genitourinary Genitourinary Symptoms Reported: No symptoms reported Genitourinary Conditions: End-stage renal disease, Unable to void/empty Genitourinary Management Strategies: Hemodialysis Hemodialysis Schedule: M,W,F at [facility] on [facility] Genitourinary Self-Management Outcome: 4 (good)  Integumentary Integumentary Symptoms Reported: Wound Additional Integumentary Details: left lower venous leg ulcer; abdominal ulcer Skin Conditions: Wound Skin Management Strategies: Dressing changes, Routine screening Skin Self-Management Outcome: 4 (good) Skin Comment: wound care clinic  Musculoskeletal Musculoskelatal Symptoms Reviewed: Unsteady gait, Weakness Musculoskeletal Conditions: Mobility limited, Joint pain, Unsteady gait Musculoskeletal Management Strategies: Medication therapy Musculoskeletal Self-Management Outcome: 4 (good) Falls in the past year?: No Number of falls in past year: 1 or less Was there an injury with Fall?: No Fall Risk Category Calculator: 0 Patient Fall Risk Level: Low Fall Risk Patient at Risk for Falls Due to: Impaired balance/gait, Impaired mobility Fall risk Follow up: Falls evaluation completed  Psychosocial Psychosocial Symptoms Reported: No symptoms reported     Quality of Family Relationships: involved, helpful, supportive Do you feel physically threatened by others?: No      02/27/2024    3:50 PM  Depression screen PHQ 2/9  Decreased Interest 0  Down, Depressed, Hopeless 0  PHQ - 2 Score 0    There were no vitals filed for this visit.  Medications Reviewed Today     Reviewed by Kaylene Pascal, RN (Registered Nurse) on 02/27/24 at 1546  Med List Status: <None>   Medication Order  Taking? Sig Documenting Provider Last Dose Status Informant  acetaminophen  (TYLENOL ) 650 MG CR tablet [id number] No Take 1,300 mg by mouth 3 (three) times daily. [provider] Taking Active Self           Med Note Minerva Alvine Dec 14, 2020  1:02 PM)    albuterol  (PROVENTIL ) (2.5 MG/3ML) 0.083% nebulizer solution [id number] No Take 3 mLs (2.5 mg total) by nebulization every 6 (six) hours as needed for wheezing or shortness of breath. [name], Elijio Guadel[place], NP Taking Active Self  allopurinol  (ZYLOPRIM ) 100 MG tablet [id number] No TAKE 1 TABLET BY MOUTH DAILY [name] Gall, MD Taking Active Self  atorvastatin  (LIPITOR ) 10 MG tablet [id number] No TAKE 1 TABLET(10 MG) BY MOUTH DAILY. Gerald Kitty., NP Taking Active Self           Med Note (Darren Caldron L   Tue Feb 13, 2024 11:56 AM) Patient stopped taking per Nephrologist due to having increased muscle cramps  BIKTARVY  50-200-25 MG TABS tablet [id number] No TAKE 1 TABLET BY MOUTH 1 TIME A DAY Lina Render, MD Taking Active   Calcium  Acetate 667 MG TABS [id number] No Take 667-2,001 mg by mouth See admin instructions. Take 2001 mg by mouth three times a day with meals and 667 mg with each snack [provider] Taking Active Self  carvedilol  (COREG ) 3.125 MG tablet [id number] No TAKE 1 TABLET(3.125 MG) BY MOUTH TWICE DAILY WITH A MEAL Valentina Gasman Karon Packer., NP Taking Active Self  diclofenac  Sodium (VOLTAREN ) 1 % GEL [id number] No Apply 2 grams topically  to the affected area three times daily as needed for pain. Doreen Gamma, MD Taking Active Self  gabapentin  (NEURONTIN ) 300 MG capsule [id number] No Take 300 mg by mouth in the morning and at bedtime. Patient is taking 1 tablet in the early evening after dialysis and 1 tablet at bedtime [provider] Taking Active   HUMALOG  KWIKPEN 100 UNIT/ML KwikPen [id number] No Inject 15 Units into the skin 3 (three) times daily. [name] Gall, MD Taking Active    insulin  degludec (TRESIBA ) 100 UNIT/ML FlexTouch Pen [id number] No Inject 15 Units into the skin daily. 3 [name] Gall, MD Taking Active   isosorbide  mononitrate (IMDUR ) 30 MG 24 hr tablet [id number] No Take 1 tablet (30 mg total) by mouth daily. Doreen Gamma, MD Taking Active Self  latanoprost  (XALATAN ) 0.005 % ophthalmic solution [id number] No Place 1 drop into both eyes at bedtime. [provider] Taking Active Self  leptospermum manuka honey (MEDIHONEY) PSTE paste [id number] No Apply 1 Application topically daily. Cleanse R lower leg wound with Vashe wound cleanser Timm Foot [phone]), do not rinse and allow to air dry. Apply Medihoney to wound bed daily, cover with dry gauze and cover with silicone foam or ABD pad and Kerlix roll gauze to secure.Interchangeable with TheraHoney Apply thin layer (3 mm) to wound. Hongalgi, Anand D, MD Taking Active   lidocaine -prilocaine  (EMLA ) cream [id number] No Apply topically as directed. At dialysis [provider] Taking Active Self  Menthol -Camphor (TIGER BALM ARTHRITIS RUB) 11-11 % CREA [id number] No Apply 1 Application topically as needed (pain). [provider] Taking Active Self  midodrine  (PROAMATINE ) 10 MG tablet [id number] No Take 10 mg by mouth 3 (three) times a week. [provider] Taking Active Self  multivitamin (RENA-VIT) TABS tablet [id number] No Take 1 tablet by mouth daily. [provider] Taking Active Self  neomycin-bacitracin-polymyxin [phone] OINT [id number] No Apply 1 Application topically in the morning and at bedtime. [name] Gall, MD Taking Active   nitroGLYCERIN  (NITROSTAT ) 0.3 MG SL tablet [id number]  Place 1 tablet (0.3 mg total) under the tongue every 5 (five) minutes as needed for chest pain. Nishan, Peter C, MD  Active   octreotide  (SANDOSTATIN  LAR DEPOT) 10 MG injection [id number] No Inject 10 mg into the muscle every 28 days. Tobin Forts, MD Taking Active Self   oxyCODONE -acetaminophen  (PERCOCET) 5-325 MG tablet [id number] No Take 1 tablet by mouth every 8 (eight) hours as needed for severe pain (pain score 7-10). Lylia Sand, MD Taking Active   polyethylene glycol (MIRALAX  / GLYCOLAX ) 17 g packet [id number] No Take 17 g by mouth daily as needed for moderate constipation. [provider] Taking Active   PREVIDENT 5000 PLUS 1.1 % CREA dental cream [id number] No Place 1 Application onto teeth at bedtime. [provider] Taking Active Self  simethicone  (MYLICON) 125 MG chewable tablet [id number] No Chew 125 mg by mouth as needed for flatulence. [provider] Taking Active   timolol  (TIMOPTIC ) 0.5 % ophthalmic solution [id number] No Place 1 drop into both eyes in the morning. [provider] Taking Active Self  umeclidinium-vilanterol (ANORO ELLIPTA ) 62.5-25 MCG/ACT AEPB [id number] No Inhale 1 puff into the lungs daily. Doreen Gamma, MD Taking Active Self            Recommendation:   Specialty provider follow-up for wound care, pain management, cardiovascular imaging as directed   Follow Up Plan:   Telephone follow up appointment date/time:  Tuesday, June 3 at 2:30 PM with Edwina Gram  Burnell Carry Natalyn Szymanowski RN BSN CCM [place]  [facility], [facility] Nurse Care Coordinator  Direct Dial : [phone] Website: [email or web address]

## 2024-02-27 NOTE — Patient Instructions (Signed)
 Visit Information  Thank you for taking time to visit with me today. Please don't hesitate to contact me if I can be of assistance to you before our next scheduled appointment.  Your next care management appointment is by telephone on Tuesday, June 3 at 2:30 PM  Please call the care guide team at [phone] if you need to cancel, schedule, or reschedule an appointment.   Please call [phone]-TALK (toll free, 24 hour hotline) if you are experiencing a Mental Health or Behavioral Health Crisis or need someone to talk to.  Louanne Roussel RN BSN CCM [place]  [facility], [facility] Nurse Care Coordinator  Direct Dial : [phone] Website: [email or web address]

## 2024-02-28 DIAGNOSIS — N2581 Secondary hyperparathyroidism of renal origin: Secondary | ICD-10-CM | POA: Diagnosis not present

## 2024-02-28 DIAGNOSIS — Z992 Dependence on renal dialysis: Secondary | ICD-10-CM | POA: Diagnosis not present

## 2024-02-28 DIAGNOSIS — N186 End stage renal disease: Secondary | ICD-10-CM | POA: Diagnosis not present

## 2024-02-28 DIAGNOSIS — D631 Anemia in chronic kidney disease: Secondary | ICD-10-CM | POA: Diagnosis not present

## 2024-02-29 ENCOUNTER — Encounter (HOSPITAL_BASED_OUTPATIENT_CLINIC_OR_DEPARTMENT_OTHER): Attending: Internal Medicine | Admitting: Internal Medicine

## 2024-02-29 DIAGNOSIS — Z992 Dependence on renal dialysis: Secondary | ICD-10-CM | POA: Insufficient documentation

## 2024-02-29 DIAGNOSIS — I509 Heart failure, unspecified: Secondary | ICD-10-CM | POA: Diagnosis not present

## 2024-02-29 DIAGNOSIS — L97818 Non-pressure chronic ulcer of other part of right lower leg with other specified severity: Secondary | ICD-10-CM | POA: Diagnosis not present

## 2024-02-29 DIAGNOSIS — E1151 Type 2 diabetes mellitus with diabetic peripheral angiopathy without gangrene: Secondary | ICD-10-CM | POA: Diagnosis not present

## 2024-02-29 DIAGNOSIS — Z21 Asymptomatic human immunodeficiency virus [HIV] infection status: Secondary | ICD-10-CM | POA: Diagnosis not present

## 2024-02-29 DIAGNOSIS — S31105S Unspecified open wound of abdominal wall, periumbilic region without penetration into peritoneal cavity, sequela: Secondary | ICD-10-CM | POA: Diagnosis not present

## 2024-02-29 DIAGNOSIS — X58XXXS Exposure to other specified factors, sequela: Secondary | ICD-10-CM | POA: Diagnosis not present

## 2024-02-29 DIAGNOSIS — I132 Hypertensive heart and chronic kidney disease with heart failure and with stage 5 chronic kidney disease, or end stage renal disease: Secondary | ICD-10-CM | POA: Insufficient documentation

## 2024-02-29 DIAGNOSIS — I255 Ischemic cardiomyopathy: Secondary | ICD-10-CM | POA: Insufficient documentation

## 2024-02-29 DIAGNOSIS — I87321 Chronic venous hypertension (idiopathic) with inflammation of right lower extremity: Secondary | ICD-10-CM | POA: Insufficient documentation

## 2024-02-29 DIAGNOSIS — N186 End stage renal disease: Secondary | ICD-10-CM | POA: Insufficient documentation

## 2024-02-29 DIAGNOSIS — S31105D Unspecified open wound of abdominal wall, periumbilic region without penetration into peritoneal cavity, subsequent encounter: Secondary | ICD-10-CM

## 2024-02-29 DIAGNOSIS — E1122 Type 2 diabetes mellitus with diabetic chronic kidney disease: Secondary | ICD-10-CM | POA: Diagnosis not present

## 2024-03-01 ENCOUNTER — Other Ambulatory Visit (HOSPITAL_BASED_OUTPATIENT_CLINIC_OR_DEPARTMENT_OTHER): Payer: Self-pay | Admitting: *Deleted

## 2024-03-01 DIAGNOSIS — D631 Anemia in chronic kidney disease: Secondary | ICD-10-CM | POA: Diagnosis not present

## 2024-03-01 DIAGNOSIS — N186 End stage renal disease: Secondary | ICD-10-CM | POA: Diagnosis not present

## 2024-03-01 DIAGNOSIS — Z992 Dependence on renal dialysis: Secondary | ICD-10-CM | POA: Diagnosis not present

## 2024-03-01 DIAGNOSIS — N2581 Secondary hyperparathyroidism of renal origin: Secondary | ICD-10-CM | POA: Diagnosis not present

## 2024-03-01 MED ORDER — GABAPENTIN 300 MG PO CAPS: 300.0000 mg | ORAL_CAPSULE | Freq: Two times a day (BID) | ORAL | 0 refills | Status: DC

## 2024-03-01 NOTE — Telephone Encounter (Signed)
 [facility] called advised to discontinue the renewal of nitroglycerin  as this should be filled by cardiology

## 2024-03-04 DIAGNOSIS — N2581 Secondary hyperparathyroidism of renal origin: Secondary | ICD-10-CM | POA: Diagnosis not present

## 2024-03-04 DIAGNOSIS — Z992 Dependence on renal dialysis: Secondary | ICD-10-CM | POA: Diagnosis not present

## 2024-03-04 DIAGNOSIS — D631 Anemia in chronic kidney disease: Secondary | ICD-10-CM | POA: Diagnosis not present

## 2024-03-04 DIAGNOSIS — N186 End stage renal disease: Secondary | ICD-10-CM | POA: Diagnosis not present

## 2024-03-05 ENCOUNTER — Encounter: Payer: Self-pay | Admitting: Physical Medicine & Rehabilitation

## 2024-03-05 ENCOUNTER — Encounter: Attending: Physical Medicine & Rehabilitation | Admitting: Physical Medicine & Rehabilitation

## 2024-03-05 VITALS — BP 103/65 | HR 79 | Ht 74.0 in | Wt 197.6 lb

## 2024-03-05 DIAGNOSIS — M545 Low back pain, unspecified: Secondary | ICD-10-CM | POA: Diagnosis not present

## 2024-03-05 DIAGNOSIS — Z79899 Other long term (current) drug therapy: Secondary | ICD-10-CM

## 2024-03-05 DIAGNOSIS — G8929 Other chronic pain: Secondary | ICD-10-CM | POA: Diagnosis not present

## 2024-03-05 DIAGNOSIS — G894 Chronic pain syndrome: Secondary | ICD-10-CM

## 2024-03-05 DIAGNOSIS — M25561 Pain in right knee: Secondary | ICD-10-CM | POA: Insufficient documentation

## 2024-03-05 DIAGNOSIS — M25562 Pain in left knee: Secondary | ICD-10-CM | POA: Diagnosis not present

## 2024-03-05 MED ORDER — OXYCODONE-ACETAMINOPHEN 5-325 MG PO TABS: 1.0000 | ORAL_TABLET | Freq: Three times a day (TID) | ORAL | 0 refills | Status: DC | PRN

## 2024-03-05 NOTE — Progress Notes (Signed)
 Subjective:    [name], male    DOB: 09-02-1951, 73 y.o.   MRN: [id number]  HPI  HPI  Jacob [name] is a 73 y.o. year old male  who  has a past medical history of Acute respiratory failure (HCC) (03/01/2018), Anemia, Arthritis, Chronic combined systolic and diastolic CHF (congestive heart failure) (HCC), Chronic lower back pain, Community acquired pneumonia (09/06/2013), COPD (chronic obstructive pulmonary disease) (HCC), Coronary atherosclerosis of native coronary artery, Drug abuse (HCC), ESRD (end stage renal disease) (HCC), Fall at home (10/2020), GERD (gastroesophageal reflux disease), GI bleeding, Glaucoma, Hepatitis B (1968), History of blood transfusion (03/01/2019), History of colon polyps, History of gout, History of kidney stones, HTN (hypertension), Human immunodeficiency virus (HIV) disease (HCC) (dx'd 1995), Hyperlipidemia, Ischemic cardiomyopathy, Muscle spasm, Myocardial infarction (HCC) (~ 2004/2005), Nocturia, Peripheral neuropathy, Pneumonia, SDH (subdural hematoma) (HCC), Syphilis, unspecified, Type II diabetes mellitus (HCC) (2004), Wears glasses, and Wears partial dentures.   They are presenting to PM&R clinic as a new patient for pain management evaluation. They were referred by for  treatment of chronic lower back pain.  Patient reports he has had back pain for few years.  He says that his lower back pain got really bad about 3 years ago.  Reports that one of his doctors had offered him back surgery but he declined at that time.  He had several ESI injections completed, Dr. Daisey Dryer?    Back pain is primarily axial.  Pain limits his activities.  He says he cannot walk very far, has trouble doing much around the house.  Has trouble sleeping due to the pain. He says he was started on oxycodone  by his PCP and this was working well to keep his pain at a more tolerable level.  Pain is particularly bad after ESRD.  Patient reports history of bilateral knee OA, history  of gout.  He reports occasional burning in his feet, occasional altered sensation.  Patient has a history of cocaine use, reports has not used in quite a few years.  Red flag symptoms: No red flags for back pain endorsed in Hx or ROS  Medications tried: Topical medications  Doesn't help his pain Nsaids Medical issues prevent use  Tylenol    Doesn't help much  Opiates  Oxycodone - helps his pain  Gabapentin - Makes sleepy, doesn't help pain TCAs - Denies  SNRIs - Denies   Other treatments: PT - Didn't help much, months ago   TENs unit- denies Injections ESI reported- didn't help? L knee cortisone- did help Surgery Denies   Interval History 03/05/24 Patient reports he has been having pain particularly in his right lower extremity where he has a wound.  He is following with wound care for this issue, reports plans to call them today about it.  He also has back pain but this is not as severe as his leg pain currently.  Reports Percocet is helping him control his pain, denies any side effects with the medication.  He tries to take it sparingly only when the pain is very severe.  Percocet allows him to get through his day and be more active.   Prior UDS results:     Component Value Date/Time   LABOPIA NONE DETECTED 02/12/2021 2322   COCAINSCRNUR NONE DETECTED 02/12/2021 2322   COCAINSCRNUR NEGATIVE 09/07/2013 0122   LABBENZ NONE DETECTED 02/12/2021 2322   LABBENZ NEGATIVE 09/07/2013 0122   AMPHETMU NONE DETECTED 02/12/2021 2322   THCU NONE DETECTED 02/12/2021 2322  LABBARB NONE DETECTED 02/12/2021 2322        Pain Inventory Average Pain 5 Pain Right Now 5 My pain is intermittent, sharp, burning, and tingling  In the last 24 hours, has pain interfered with the following? General activity 4 Relation with others 6 Enjoyment of life 4 What TIME of day is your pain at its worst? morning , evening, and night Sleep (in general) Poor  Pain is worse with: walking, sitting,  standing, and some activites Pain improves with: rest, medication, and other Relief from Meds: 5   Family History  Problem Relation Age of Onset   Heart failure Father    Hypertension Father    Diabetes Brother    Heart attack Brother    Alzheimer's disease Mother    Stroke Sister    Diabetes Sister    Alzheimer's disease Sister    Hypertension Brother    Diabetes Brother    Drug abuse Brother    Colon cancer Neg Hx    Social History   Socioeconomic History   Marital status: Single    Spouse name: Not on file   Number of children: 1   Years of education: Not on file   Highest education level: Not on file  Occupational History   Occupation: retired  Tobacco Use   Smoking status: Every Day    Current packs/day: 0.50    Average packs/day: 0.5 packs/day for 43.0 years (21.5 ttl pk-yrs)    Types: Cigarettes    Passive exposure: Current   Smokeless tobacco: Never  Vaping Use   Vaping status: Never Used  Substance and Sexual Activity   Alcohol use: Not Currently    Alcohol/week: 12.0 standard drinks of alcohol    Types: 12 Standard drinks or equivalent per week    Comment: occassional   Drug use: Not Currently    Types: Cocaine    Comment: hx of crack/cocaine 2yrs ago 10/01/2019- none   Sexual activity: Yes    Partners: Male    Comment: Declined condoms  Other Topics Concern   Not on file  Social History Narrative   Retired.       As of 05/2015:   Diet: No salt    Caffeine    Married: Single    House: Condo, 2 stories, 1 person (self)    Pets: No    Current/Past profession: N/A   Exercise: walks daily    Living Will: Yes    DNR   POA/HPOA: No       Social Drivers of Corporate investment banker Strain: Low Risk  (10/26/2020)   Overall Financial Resource Strain (CARDIA)    Difficulty of Paying Living Expenses: Not hard at all  Food Insecurity: No Food Insecurity (02/27/2024)   Hunger Vital Sign    Worried About Running Out of Food in the Last Year: Never  true    Ran Out of Food in the Last Year: Never true  Transportation Needs: No Transportation Needs (02/27/2024)   PRAPARE - Administrator, Civil Service (Medical): No    Lack of Transportation (Non-Medical): No  Physical Activity: Insufficiently Active (06/26/2018)   Exercise Vital Sign    Days of Exercise per Week: 7 days    Minutes of Exercise per Session: 20 min  Stress: No Stress Concern Present (02/27/2024)   [employer] of Occupational Health - Occupational Stress Questionnaire    Feeling of Stress : Not at all  Social Connections: Somewhat Isolated (06/26/2018)  Social Connection and Isolation Panel [NHANES]    Frequency of Communication with Friends and Family: Three times a week    Frequency of Social Gatherings with Friends and Family: Three times a week    Attends Religious Services: More than 4 times per year    Active Member of Clubs or Organizations: No    Attends Banker Meetings: Never    Marital Status: Never married   Past Surgical History:  Procedure Laterality Date   A/V FISTULAGRAM N/A 12/07/2023   Procedure: A/V Fistulagram;  Surgeon: Baron Border, MD;  [facility]: [facility] INVASIVE CV LAB;  Service: Cardiovascular;  Laterality: N/A;   AV FISTULA PLACEMENT Left 08/02/2018   Procedure: ARTERIOVENOUS (AV) FISTULA CREATION  left arm radiocephlic;  Surgeon: Young Hensen, MD;  Location: [facility] OR;  Service: Vascular;  Laterality: Left;   AV FISTULA PLACEMENT Left 08/01/2019   Procedure: LEFT BRACHIOCEPHALIC ARTERIOVENOUS (AV) FISTULA CREATION;  Surgeon: Mayo Speck, MD;  Location: [facility] OR;  Service: Vascular;  Laterality: Left;   AV FISTULA PLACEMENT Right 04/12/2022   Procedure: RIGHT UPPER EXTREMITY ARTERIOVENOUS (AV)  GRAFT INSERTION USING GORE STRETCH 4-7 MM;  Surgeon: Dannis Dy, MD;  Location: [facility] OR;  Service: Vascular;  Laterality: Right;   BASCILIC VEIN TRANSPOSITION Left 10/03/2019   Procedure: BASILIC VEIN TRANSPOSITION  LEFT SECOND STAGE;  Surgeon: Mayo Speck, MD;  Location: [facility] OR;  Service: Vascular;  Laterality: Left;   BIOPSY  01/25/2021   Procedure: BIOPSY;  Surgeon: Albertina Hugger, MD;  Location: [facility] ENDOSCOPY;  Service: Gastroenterology;;   CARDIAC CATHETERIZATION  10/2002; 12/19/2004   Maximo Spar 03/08/2011   COLONOSCOPY  2013   Dr.John Elvin Hammer    CORONARY ARTERY BYPASS GRAFT  02/24/2003   CABG X2/notes 03/08/2011   CORONARY STENT INTERVENTION N/A 12/19/2019   Procedure: CORONARY STENT INTERVENTION;  Surgeon: Lucendia Rusk, MD;  [facility]: [facility];  Service: Cardiovascular;  Laterality: N/A;   CORONARY ULTRASOUND/IVUS N/A 12/19/2019   Procedure: Intravascular Ultrasound/IVUS;  Surgeon: Lucendia Rusk, MD;  Location: [facility] INVASIVE CV LAB;  Service: Cardiovascular;  Laterality: N/A;   ENTEROSCOPY N/A 01/25/2021   Procedure: ENTEROSCOPY;  Surgeon: Albertina Hugger, MD;  Location: [facility] ENDOSCOPY;  Service: Gastroenterology;  Laterality: N/A;   ENTEROSCOPY N/A 02/13/2021   Procedure: ENTEROSCOPY;  Surgeon: Lajuan Pila, MD;  Location: [facility] ENDOSCOPY;  Service: Endoscopy;  Laterality: N/A;   ENTEROSCOPY N/A 05/07/2021   Procedure: ENTEROSCOPY;  Surgeon: Ace Holder, MD;  Location: [facility] ENDOSCOPY;  Service: Gastroenterology;  Laterality: N/A;   ESOPHAGOGASTRODUODENOSCOPY (EGD) WITH PROPOFOL  N/A 02/08/2019   Procedure: ESOPHAGOGASTRODUODENOSCOPY (EGD) WITH PROPOFOL ;  Surgeon: Janel Medford, MD;  Location: [facility] ENDOSCOPY;  Service: Gastroenterology;  Laterality: N/A;   ESOPHAGOGASTRODUODENOSCOPY (EGD) WITH PROPOFOL  N/A 12/22/2019   Procedure: ESOPHAGOGASTRODUODENOSCOPY (EGD) WITH PROPOFOL ;  Surgeon: Annis Kinder, DO;  Location: [facility] ENDOSCOPY;  Service: Gastroenterology;  Laterality: N/A;   ESOPHAGOGASTRODUODENOSCOPY (EGD) WITH PROPOFOL  N/A 10/19/2020   Procedure: ESOPHAGOGASTRODUODENOSCOPY (EGD) WITH PROPOFOL ;  Surgeon: Lajuan Pila, MD;  Location: [facility] ENDOSCOPY;  Service: Endoscopy;   Laterality: N/A;   ESOPHAGOGASTRODUODENOSCOPY (EGD) WITH PROPOFOL  N/A 12/22/2020   Procedure: ESOPHAGOGASTRODUODENOSCOPY (EGD) WITH PROPOFOL ;  Surgeon: Kenney Peacemaker, MD;  Location: [facility] ENDOSCOPY;  Service: Endoscopy;  Laterality: N/A;   ESOPHAGOGASTRODUODENOSCOPY (EGD) WITH PROPOFOL  N/A 01/09/2021   Procedure: ESOPHAGOGASTRODUODENOSCOPY (EGD) WITH PROPOFOL ;  Surgeon: Tobin Forts, MD;  Location: [facility] ENDOSCOPY;  Service: Endoscopy;  Laterality: N/A;   HEMOSTASIS CLIP  PLACEMENT  12/22/2019   Procedure: HEMOSTASIS CLIP PLACEMENT;  Surgeon: Annis Kinder, DO;  Location: [facility] ENDOSCOPY;  Service: Gastroenterology;;   HEMOSTASIS CLIP PLACEMENT  12/22/2020   Procedure: HEMOSTASIS CLIP PLACEMENT;  Surgeon: Kenney Peacemaker, MD;  Location: [facility] ENDOSCOPY;  Service: Endoscopy;;   HEMOSTASIS CONTROL  12/22/2020   Procedure: HEMOSTASIS CONTROL/hemospray;  Surgeon: Kenney Peacemaker, MD;  Location: [facility] ENDOSCOPY;  Service: Endoscopy;;   HOT HEMOSTASIS N/A 02/08/2019   Procedure: HOT HEMOSTASIS (ARGON PLASMA COAGULATION/BICAP);  Surgeon: Janel Medford, MD;  Location: [facility] ENDOSCOPY;  Service: Gastroenterology;  Laterality: N/A;   HOT HEMOSTASIS N/A 12/22/2019   Procedure: HOT HEMOSTASIS (ARGON PLASMA COAGULATION/BICAP);  Surgeon: Annis Kinder, DO;  Location: [facility] ENDOSCOPY;  Service: Gastroenterology;  Laterality: N/A;   HOT HEMOSTASIS N/A 10/19/2020   Procedure: HOT HEMOSTASIS (ARGON PLASMA COAGULATION/BICAP);  Surgeon: Lajuan Pila, MD;  Location: [facility] ENDOSCOPY;  Service: Endoscopy;  Laterality: N/A;   HOT HEMOSTASIS N/A 12/22/2020   Procedure: HOT HEMOSTASIS (ARGON PLASMA COAGULATION/BICAP);  Surgeon: Kenney Peacemaker, MD;  Location: [facility] ENDOSCOPY;  Service: Endoscopy;  Laterality: N/A;   HOT HEMOSTASIS N/A 01/09/2021   Procedure: HOT HEMOSTASIS (ARGON PLASMA COAGULATION/BICAP);  Surgeon: Tobin Forts, MD;  Location: [facility] ENDOSCOPY;  Service: Endoscopy;  Laterality: N/A;   HOT HEMOSTASIS N/A 01/25/2021   Procedure: HOT  HEMOSTASIS (ARGON PLASMA COAGULATION/BICAP);  Surgeon: Albertina Hugger, MD;  Location: [facility] ENDOSCOPY;  Service: Gastroenterology;  Laterality: N/A;   HOT HEMOSTASIS N/A 02/13/2021   Procedure: HOT HEMOSTASIS (ARGON PLASMA COAGULATION/BICAP);  Surgeon: Lajuan Pila, MD;  Location: [facility] ENDOSCOPY;  Service: Endoscopy;  Laterality: N/A;   HOT HEMOSTASIS N/A 05/07/2021   Procedure: HOT HEMOSTASIS (ARGON PLASMA COAGULATION/BICAP);  Surgeon: Ace Holder, MD;  Location: [facility] ENDOSCOPY;  Service: Gastroenterology;  Laterality: N/A;   INSERTION OF DIALYSIS CATHETER N/A 02/08/2022   Procedure: INSERTION OF TUNNELED DIALYSIS CATHETER;  Surgeon: Dannis Dy, MD;  Location: [facility] OR;  Service: Vascular;  Laterality: N/A;   INTERTROCHANTERIC HIP FRACTURE SURGERY Left 11/2006   Maximo Spar 03/08/2011   IR FLUORO GUIDE CV LINE LEFT  03/14/2022   IR FLUORO GUIDE CV LINE RIGHT  07/24/2019   IR FLUORO GUIDE CV LINE RIGHT  07/30/2019   IR US  GUIDE VASC ACCESS RIGHT  07/24/2019   IR US  GUIDE VASC ACCESS RIGHT  07/30/2019   LAPAROSCOPIC CHOLECYSTECTOMY  05/2006   LIGATION OF ARTERIOVENOUS  FISTULA Left 02/08/2022   Procedure: LIGATION OF LEFT ARTERIOVENOUS  FISTULA;  Surgeon: Dannis Dy, MD;  Location: [facility] OR;  Service: Vascular;  Laterality: Left;   LIGATION OF COMPETING BRANCHES OF ARTERIOVENOUS FISTULA Left 11/05/2018   Procedure: LIGATION OF COMPETING BRANCHES OF ARTERIOVENOUS FISTULA  LEFT  ARM;  Surgeon: Young Hensen, MD;  Location: [facility] OR;  Service: Vascular;  Laterality: Left;   LUMBAR LAMINECTOMY/DECOMPRESSION MICRODISCECTOMY N/A 02/29/2016   Procedure: Left L4-5 Lateral Recess Decompression, Removal Extradural Intraspinal Facet Cyst;  Surgeon: Adah Acron, MD;  Location: [facility] OR;  Service: Orthopedics;  Laterality: N/A;   METATARSAL HEAD EXCISION Right 07/20/2022   Procedure: Irragation and debridement of right foot wound with extraction of all necrotic soft tissue and bone;  Surgeon:  Evertt Hoe, DPM;  Location: [facility] OR;  Service: Podiatry;  Laterality: Right;  Right 4th Met head and base of proximal phalanx resection   MULTIPLE TOOTH EXTRACTIONS     ORIF MANDIBULAR FRACTURE Left 08/13/2004   ORIF of left body fracture mandible with  KLS Martin 2.3-mm six hole/notes 03/08/2011   RIGHT/LEFT HEART CATH AND CORONARY/GRAFT ANGIOGRAPHY N/A 12/19/2019   Procedure: RIGHT/LEFT HEART CATH AND CORONARY/GRAFT ANGIOGRAPHY;  Surgeon: Lucendia Rusk, MD;  Location: [facility] INVASIVE CV LAB;  Service: Cardiovascular;  Laterality: N/A;   SCLEROTHERAPY  12/22/2019   Procedure: SCLEROTHERAPY;  Surgeon: Annis Kinder, DO;  Location: [facility] ENDOSCOPY;  Service: Gastroenterology;;   Daryle Eon  02/13/2021   Procedure: Daryle Eon;  Surgeon: Lajuan Pila, MD;  Location: [facility] ENDOSCOPY;  Service: Endoscopy;;   Past Medical History:  Diagnosis Date   Acute respiratory failure (HCC) 03/01/2018   Anemia    Arthritis    "all over; mostly knees and back" (02/28/2018)   Chronic combined systolic and diastolic CHF (congestive heart failure) (HCC)    Chronic lower back pain    stenosis   Community acquired pneumonia 09/06/2013   COPD (chronic obstructive pulmonary disease) (HCC)    Coronary atherosclerosis of native coronary artery    a. 02/2003 s/p CABG x 2 (VG->RI, VG->RPDA; b. 11/2019 PCI: LM nl, LAD 90d, D3 50, RI 100, LCX 100p, OM3 100 - fills via L->L collats from D2/dLAD, RCA 100p, VG->RPDA ok, VG->RI 95 (3.5x48 Synergy XD DES).   Drug abuse (HCC)    hx; tested for cocaine as recently as 2/08. says he is not using drugs now - avoided defib. for this reason    ESRD (end stage renal disease) (HCC)    Hemo M-W-F- [facility]   Fall at home 10/2020   GERD (gastroesophageal reflux disease)    takes OTC meds as needed   GI bleeding    a. 11/2019 EGD: angiodysplastic lesions w/ bleeding s/p argon plasma/clipping/epi inj. Multiple admissions for the same.   Glaucoma    uses eye drops daily    Hepatitis B 1968   "tx'd w/isolation; caught it from toilet stools in gym"   History of blood transfusion 03/01/2019   History of colon polyps    benign   History of gout    takes Allopurinol  daily as well as Colchicine -if needed (02/28/2018)   History of kidney stones    HTN (hypertension)    takes Coreg ,Imdur .and Apresoline  daily   Human immunodeficiency virus (HIV) disease (HCC) dx'd 1995   on Biktarvy  as of 12/2020.     Hyperlipidemia    Ischemic cardiomyopathy    a. 01/2019 Echo: EF 40-45%, diffuse HK, mild basal septal hypertrophy. Diast dysfxn. Nl RV size/fxn. Sev dil LA. Triv MR/TR/PR.   Muscle spasm    takes Zanaflex  as needed   Myocardial infarction (HCC) ~ 2004/2005   Nocturia    Peripheral neuropathy    takes gabapentin  daily   Pneumonia    "at least twice" (02/28/2018)   SDH (subdural hematoma) (HCC)    Syphilis, unspecified    Type II diabetes mellitus (HCC) 2004   Lantus  daily.Average fasting blood sugar 125-199   Wears glasses    Wears partial dentures    BP 103/65   Pulse 79   Ht 6\' 2"  (1.88 m)   Wt 197 lb 9.6 oz (89.6 kg)   SpO2 93%   BMI 25.37 kg/m   Opioid Risk Score:   Fall Risk Score:  `1  Depression screen [facility] 2/9     03/05/2024    9:43 AM 02/27/2024    3:50 PM 02/22/2024    9:40 AM 02/13/2024   12:41 PM 01/30/2024   10:18 AM 12/26/2023   12:49 PM 10/31/2023   10:34 AM  Depression  screen PHQ 2/9  Decreased Interest 0 0 0 0 0 0 0  Down, Depressed, Hopeless 0 0 0 0 0 0 0  PHQ - 2 Score 0 0 0 0 0 0 0  Altered sleeping   0  0    Tired, decreased energy   0  0    Change in appetite   0  0    Feeling bad or failure about yourself    0  0    Trouble concentrating   0  0    Moving slowly or fidgety/restless   0  0    Suicidal thoughts   0  0    PHQ-9 Score   0  0    Difficult doing work/chores   Not difficult at all        Review of Systems  Constitutional:        Weight loss  Respiratory:  Positive for shortness of breath.   Gastrointestinal:   Positive for constipation.  Musculoskeletal:  Positive for gait problem.       Pain on right side from axiliary down to right foot  Skin:  Positive for rash.  All other systems reviewed and are negative.      Objective:   Physical Exam  Gen: no distress, normal appearing HEENT: oral mucosa pink and moist, NCAT Chest: normal effort, normal rate of breathing Abd: soft, non-distended Ext: no edema Psych: pleasant, normal affect Skin: Wound on right lower extremity wrapped in gauze Neuro: Alert and awake, follows commands, cranial nerves II through XII grossly intact, normal speech and language RUE: 5/5 Deltoid, 5/5 Biceps, 5/5 Triceps, 5/5 Wrist Ext, 5/5 Grip LUE: 5/5 Deltoid, 5/5 Biceps, 5/5 Triceps, 5/5 Wrist Ext, 5/5 Grip RLE: HF 5/5, KE 5/5, ADF 5/5, APF 5/5 LLE: HF 5/5, KE 5/5, ADF 5/5, APF 5/5 Sensory exam normal for light touch and pain in all 4 limbs.  abnormal tone appreciated.   Musculoskeletal:  Mild TTP bilateral knee joint line Minimal TTP lumbar spine primarily on the right side Facet loading negative today Slump test negative bilaterally  Prior Exam Ambulating without assistive device, decreased step length.  No unsteadiness noted Minimal TTP bilateral lumbar spine today-reports today to be day FABER and FADIR negative bilaterally SLR negative bilaterally Facet loading negative, No significant increase of pain with spinal extension today No significant C-spine TTP TTP bilateral knee joint line-mild  MRI L spine 03/23/2021 Disc levels:   T11-12 and T12-L1: Normal   L1-2 and L2-3: Minimal disc bulges.  No stenosis.   L3-4: Minimal disc bulge. Mild facet and ligamentous hypertrophy. Mild narrowing of the lateral recesses but no apparent neural compression. The facet arthritis could contribute to low back pain. The facet arthritis has worsened slightly since 2017.   L4-5: Bilateral facet degeneration with facet and ligamentous hypertrophy. 5 mm of  anterolisthesis, increased from about 3 mm in 2017. Worsened disc degeneration with broad-based herniation of the disc. Multifactorial spinal stenosis at this level that could cause neural compression on either or both sides. Bilateral foraminal stenosis could affect the exiting L4 nerves as well. Synovial cyst previously seen associated with the facet on the left is not clearly recognizable.   L5-S1: Disc degeneration with loss of disc height. Endplate osteophytes and shallow protrusion of the disc. Facet degeneration and hypertrophy. Stenosis of both subarticular lateral recesses and neural foramina could cause neural compression on either or both sides. Findings have worsened since the previous exam.   IMPRESSION:  L3-4: Mild bulging of the disc. Facet degeneration and hypertrophy which is progressive since 2017. No sign of neural compressive stenosis. The facet arthritis could contribute to low back pain.   L4-5: Worsening of disease at this level. Bilateral facet degeneration and hypertrophy with anterolisthesis of 5 mm, increased from 3 mm previously. Broad-based herniation of the disc. Stenosis of the canal, lateral recesses and neural foramina could cause neural compression on either or both sides. Discogenic endplate marrow changes could relate to low back pain. The facet arthropathy could relate to low back pain.   L5-S1: Worsening of disease at this level. Disc degeneration with loss of disc height, endplate osteophytes and shallow protrusion of the disc. Facet degeneration and hypertrophy. Stenosis of the subarticular lateral recesses and neural foramina that could cause neural compression on either or both sides. Endplate edematous changes could relate to low back pain.      Assessment & Plan:  1) Chronic lower back pain, primarily axial  - MRI with evidence of spinal stenosis and multilevel lumbar spondylosis particularly at L3-4, L4-5, L5-S1 2) Bilateral knee  osteoarthritis 3) Chronic pain syndrome 4)ESRD on hemodialysis 5)Chronic combined systolic and diastolic CHF 6)CAD with history of CABG 7)Polyneuropathy due to diabetes mellitus type 2 8)Gout, on allopurinol  9)History of substance abuse, cocaine  - Will need close follow-up and monitoring of random UDS 10) right leg wound  - Follow-up with wound care  Continue gabapentin  current dose -Food for pain discussed,list provided -Discussed trying TENS unit-reviewed again today -Poor results with nonopioid medications, medication options also limited by his chronic medical conditions -Opiate risk tool moderate -Consider facet joint injections, imaging with evidence of facet joint disease and lower back pain that is consistent with facet joint mediated pain --Continue UDS and pill counts.  Continue PDMP monitoring.  Pain contract completed prior visit. -Discussed bringing pill bottle with any medications even if empty to all appointments -Continue Percocet 5 mg every 8 hours as needed -Consider facet joint injetions

## 2024-03-06 ENCOUNTER — Encounter (HOSPITAL_BASED_OUTPATIENT_CLINIC_OR_DEPARTMENT_OTHER): Admitting: Internal Medicine

## 2024-03-06 DIAGNOSIS — N186 End stage renal disease: Secondary | ICD-10-CM | POA: Diagnosis not present

## 2024-03-06 DIAGNOSIS — Z992 Dependence on renal dialysis: Secondary | ICD-10-CM | POA: Diagnosis not present

## 2024-03-06 DIAGNOSIS — E1122 Type 2 diabetes mellitus with diabetic chronic kidney disease: Secondary | ICD-10-CM | POA: Diagnosis not present

## 2024-03-06 DIAGNOSIS — I132 Hypertensive heart and chronic kidney disease with heart failure and with stage 5 chronic kidney disease, or end stage renal disease: Secondary | ICD-10-CM | POA: Diagnosis not present

## 2024-03-06 DIAGNOSIS — I87321 Chronic venous hypertension (idiopathic) with inflammation of right lower extremity: Secondary | ICD-10-CM | POA: Diagnosis not present

## 2024-03-06 DIAGNOSIS — I255 Ischemic cardiomyopathy: Secondary | ICD-10-CM | POA: Diagnosis not present

## 2024-03-06 DIAGNOSIS — I509 Heart failure, unspecified: Secondary | ICD-10-CM | POA: Diagnosis not present

## 2024-03-06 DIAGNOSIS — E1151 Type 2 diabetes mellitus with diabetic peripheral angiopathy without gangrene: Secondary | ICD-10-CM | POA: Diagnosis not present

## 2024-03-06 DIAGNOSIS — S31105S Unspecified open wound of abdominal wall, periumbilic region without penetration into peritoneal cavity, sequela: Secondary | ICD-10-CM | POA: Diagnosis not present

## 2024-03-06 DIAGNOSIS — L97818 Non-pressure chronic ulcer of other part of right lower leg with other specified severity: Secondary | ICD-10-CM | POA: Diagnosis not present

## 2024-03-07 ENCOUNTER — Encounter (HOSPITAL_BASED_OUTPATIENT_CLINIC_OR_DEPARTMENT_OTHER): Admitting: Internal Medicine

## 2024-03-08 DIAGNOSIS — L97818 Non-pressure chronic ulcer of other part of right lower leg with other specified severity: Secondary | ICD-10-CM | POA: Diagnosis not present

## 2024-03-08 DIAGNOSIS — N186 End stage renal disease: Secondary | ICD-10-CM | POA: Diagnosis not present

## 2024-03-08 DIAGNOSIS — I5042 Chronic combined systolic (congestive) and diastolic (congestive) heart failure: Secondary | ICD-10-CM | POA: Diagnosis not present

## 2024-03-08 DIAGNOSIS — N2581 Secondary hyperparathyroidism of renal origin: Secondary | ICD-10-CM | POA: Diagnosis not present

## 2024-03-08 DIAGNOSIS — D631 Anemia in chronic kidney disease: Secondary | ICD-10-CM | POA: Diagnosis not present

## 2024-03-08 DIAGNOSIS — I10 Essential (primary) hypertension: Secondary | ICD-10-CM | POA: Diagnosis not present

## 2024-03-08 DIAGNOSIS — S31105S Unspecified open wound of abdominal wall, periumbilic region without penetration into peritoneal cavity, sequela: Secondary | ICD-10-CM | POA: Diagnosis not present

## 2024-03-08 DIAGNOSIS — Z992 Dependence on renal dialysis: Secondary | ICD-10-CM | POA: Diagnosis not present

## 2024-03-11 DIAGNOSIS — I10 Essential (primary) hypertension: Secondary | ICD-10-CM | POA: Diagnosis not present

## 2024-03-11 DIAGNOSIS — N2581 Secondary hyperparathyroidism of renal origin: Secondary | ICD-10-CM | POA: Diagnosis not present

## 2024-03-11 DIAGNOSIS — L97818 Non-pressure chronic ulcer of other part of right lower leg with other specified severity: Secondary | ICD-10-CM | POA: Diagnosis not present

## 2024-03-11 DIAGNOSIS — Z992 Dependence on renal dialysis: Secondary | ICD-10-CM | POA: Diagnosis not present

## 2024-03-11 DIAGNOSIS — S31105S Unspecified open wound of abdominal wall, periumbilic region without penetration into peritoneal cavity, sequela: Secondary | ICD-10-CM | POA: Diagnosis not present

## 2024-03-11 DIAGNOSIS — N186 End stage renal disease: Secondary | ICD-10-CM | POA: Diagnosis not present

## 2024-03-11 DIAGNOSIS — D631 Anemia in chronic kidney disease: Secondary | ICD-10-CM | POA: Diagnosis not present

## 2024-03-12 ENCOUNTER — Other Ambulatory Visit: Payer: Self-pay | Admitting: Internal Medicine

## 2024-03-12 ENCOUNTER — Encounter (HOSPITAL_BASED_OUTPATIENT_CLINIC_OR_DEPARTMENT_OTHER): Admitting: Internal Medicine

## 2024-03-12 ENCOUNTER — Other Ambulatory Visit: Payer: Self-pay

## 2024-03-12 DIAGNOSIS — I509 Heart failure, unspecified: Secondary | ICD-10-CM | POA: Diagnosis not present

## 2024-03-12 DIAGNOSIS — I87321 Chronic venous hypertension (idiopathic) with inflammation of right lower extremity: Secondary | ICD-10-CM | POA: Diagnosis not present

## 2024-03-12 DIAGNOSIS — L97818 Non-pressure chronic ulcer of other part of right lower leg with other specified severity: Secondary | ICD-10-CM | POA: Diagnosis not present

## 2024-03-12 DIAGNOSIS — Z992 Dependence on renal dialysis: Secondary | ICD-10-CM | POA: Diagnosis not present

## 2024-03-12 DIAGNOSIS — S31105S Unspecified open wound of abdominal wall, periumbilic region without penetration into peritoneal cavity, sequela: Secondary | ICD-10-CM | POA: Diagnosis not present

## 2024-03-12 DIAGNOSIS — E1151 Type 2 diabetes mellitus with diabetic peripheral angiopathy without gangrene: Secondary | ICD-10-CM | POA: Diagnosis not present

## 2024-03-12 DIAGNOSIS — S31105D Unspecified open wound of abdominal wall, periumbilic region without penetration into peritoneal cavity, subsequent encounter: Secondary | ICD-10-CM | POA: Diagnosis not present

## 2024-03-12 DIAGNOSIS — I255 Ischemic cardiomyopathy: Secondary | ICD-10-CM | POA: Diagnosis not present

## 2024-03-12 DIAGNOSIS — E1122 Type 2 diabetes mellitus with diabetic chronic kidney disease: Secondary | ICD-10-CM | POA: Diagnosis not present

## 2024-03-12 DIAGNOSIS — I132 Hypertensive heart and chronic kidney disease with heart failure and with stage 5 chronic kidney disease, or end stage renal disease: Secondary | ICD-10-CM | POA: Diagnosis not present

## 2024-03-12 DIAGNOSIS — N186 End stage renal disease: Secondary | ICD-10-CM | POA: Diagnosis not present

## 2024-03-12 NOTE — Progress Notes (Signed)
 Specialty Pharmacy Refill Coordination Note  Jacob [name] is a 73 y.o. male contacted today regarding refills of specialty medication(s) Octreotide  Acetate (SandoSTATIN  [place])   Patient requested Courier to Provider Office   Delivery date: 03/20/24   Verified address: [name]   Medication will be filled on 03/19/24.

## 2024-03-13 DIAGNOSIS — N186 End stage renal disease: Secondary | ICD-10-CM | POA: Diagnosis not present

## 2024-03-13 DIAGNOSIS — Z992 Dependence on renal dialysis: Secondary | ICD-10-CM | POA: Diagnosis not present

## 2024-03-13 DIAGNOSIS — D631 Anemia in chronic kidney disease: Secondary | ICD-10-CM | POA: Diagnosis not present

## 2024-03-13 DIAGNOSIS — N2581 Secondary hyperparathyroidism of renal origin: Secondary | ICD-10-CM | POA: Diagnosis not present

## 2024-03-14 ENCOUNTER — Ambulatory Visit (HOSPITAL_COMMUNITY)
Admission: RE | Admit: 2024-03-14 | Discharge: 2024-03-14 | Disposition: A | Discharge status: Home / Self Care | Source: Ambulatory Visit | Attending: Vascular Surgery | Admitting: Vascular Surgery

## 2024-03-14 ENCOUNTER — Other Ambulatory Visit (HOSPITAL_COMMUNITY): Payer: Self-pay | Admitting: Sports Medicine

## 2024-03-14 DIAGNOSIS — L97818 Non-pressure chronic ulcer of other part of right lower leg with other specified severity: Secondary | ICD-10-CM | POA: Diagnosis not present

## 2024-03-14 DIAGNOSIS — L97922 Non-pressure chronic ulcer of unspecified part of left lower leg with fat layer exposed: Secondary | ICD-10-CM | POA: Diagnosis not present

## 2024-03-14 DIAGNOSIS — S31104S Unspecified open wound of abdominal wall, left lower quadrant without penetration into peritoneal cavity, sequela: Secondary | ICD-10-CM | POA: Diagnosis not present

## 2024-03-14 DIAGNOSIS — L97509 Non-pressure chronic ulcer of other part of unspecified foot with unspecified severity: Secondary | ICD-10-CM | POA: Insufficient documentation

## 2024-03-14 DIAGNOSIS — R0989 Other specified symptoms and signs involving the circulatory and respiratory systems: Secondary | ICD-10-CM | POA: Diagnosis not present

## 2024-03-15 DIAGNOSIS — N2581 Secondary hyperparathyroidism of renal origin: Secondary | ICD-10-CM | POA: Diagnosis not present

## 2024-03-15 DIAGNOSIS — D631 Anemia in chronic kidney disease: Secondary | ICD-10-CM | POA: Diagnosis not present

## 2024-03-15 DIAGNOSIS — Z992 Dependence on renal dialysis: Secondary | ICD-10-CM | POA: Diagnosis not present

## 2024-03-15 DIAGNOSIS — N186 End stage renal disease: Secondary | ICD-10-CM | POA: Diagnosis not present

## 2024-03-15 LAB — VAS US ABI WITH/WO TBI
Left ABI: 0.96
Right ABI: UNDETERMINED

## 2024-03-15 NOTE — Telephone Encounter (Signed)
 I talked to Jan about this yesterday and she and Dr. General Kenner are adamant that this is your patient.  I know we refilled this for him before.  In one section from 2023 I found this from Dr. General Kenner:  "This is Dr. Alberta Almond patient, I saw him once due to a scheduling error."  I'm not sure what to do at this point.

## 2024-03-15 NOTE — Telephone Encounter (Signed)
 This is apparently your patient even though Dr. General Kenner has seen them a few times.  Should I just refill this Octreotide  as requested or do they need to be seen in the office?  Thanks!

## 2024-03-18 DIAGNOSIS — Z992 Dependence on renal dialysis: Secondary | ICD-10-CM | POA: Diagnosis not present

## 2024-03-18 DIAGNOSIS — N186 End stage renal disease: Secondary | ICD-10-CM | POA: Diagnosis not present

## 2024-03-18 DIAGNOSIS — D631 Anemia in chronic kidney disease: Secondary | ICD-10-CM | POA: Diagnosis not present

## 2024-03-18 DIAGNOSIS — N2581 Secondary hyperparathyroidism of renal origin: Secondary | ICD-10-CM | POA: Diagnosis not present

## 2024-03-19 ENCOUNTER — Ambulatory Visit: Payer: Self-pay | Admitting: Sports Medicine

## 2024-03-19 DIAGNOSIS — H2511 Age-related nuclear cataract, right eye: Secondary | ICD-10-CM | POA: Diagnosis not present

## 2024-03-19 DIAGNOSIS — H401132 Primary open-angle glaucoma, bilateral, moderate stage: Secondary | ICD-10-CM | POA: Diagnosis not present

## 2024-03-19 DIAGNOSIS — H02051 Trichiasis without entropian right upper eyelid: Secondary | ICD-10-CM | POA: Diagnosis not present

## 2024-03-20 DIAGNOSIS — N186 End stage renal disease: Secondary | ICD-10-CM | POA: Diagnosis not present

## 2024-03-20 DIAGNOSIS — N2581 Secondary hyperparathyroidism of renal origin: Secondary | ICD-10-CM | POA: Diagnosis not present

## 2024-03-20 DIAGNOSIS — D631 Anemia in chronic kidney disease: Secondary | ICD-10-CM | POA: Diagnosis not present

## 2024-03-20 DIAGNOSIS — Z992 Dependence on renal dialysis: Secondary | ICD-10-CM | POA: Diagnosis not present

## 2024-03-21 ENCOUNTER — Other Ambulatory Visit: Payer: Self-pay

## 2024-03-21 ENCOUNTER — Telehealth: Payer: Self-pay

## 2024-03-21 ENCOUNTER — Other Ambulatory Visit (HOSPITAL_COMMUNITY): Payer: Self-pay

## 2024-03-21 ENCOUNTER — Encounter (HOSPITAL_BASED_OUTPATIENT_CLINIC_OR_DEPARTMENT_OTHER): Admitting: Internal Medicine

## 2024-03-21 DIAGNOSIS — E1122 Type 2 diabetes mellitus with diabetic chronic kidney disease: Secondary | ICD-10-CM | POA: Diagnosis not present

## 2024-03-21 DIAGNOSIS — L97818 Non-pressure chronic ulcer of other part of right lower leg with other specified severity: Secondary | ICD-10-CM

## 2024-03-21 DIAGNOSIS — I255 Ischemic cardiomyopathy: Secondary | ICD-10-CM | POA: Diagnosis not present

## 2024-03-21 DIAGNOSIS — I70242 Atherosclerosis of native arteries of left leg with ulceration of calf: Secondary | ICD-10-CM | POA: Diagnosis not present

## 2024-03-21 DIAGNOSIS — I87311 Chronic venous hypertension (idiopathic) with ulcer of right lower extremity: Secondary | ICD-10-CM | POA: Diagnosis not present

## 2024-03-21 DIAGNOSIS — E1151 Type 2 diabetes mellitus with diabetic peripheral angiopathy without gangrene: Secondary | ICD-10-CM

## 2024-03-21 DIAGNOSIS — Z992 Dependence on renal dialysis: Secondary | ICD-10-CM | POA: Diagnosis not present

## 2024-03-21 DIAGNOSIS — I509 Heart failure, unspecified: Secondary | ICD-10-CM | POA: Diagnosis not present

## 2024-03-21 DIAGNOSIS — I132 Hypertensive heart and chronic kidney disease with heart failure and with stage 5 chronic kidney disease, or end stage renal disease: Secondary | ICD-10-CM | POA: Diagnosis not present

## 2024-03-21 DIAGNOSIS — I87321 Chronic venous hypertension (idiopathic) with inflammation of right lower extremity: Secondary | ICD-10-CM | POA: Diagnosis not present

## 2024-03-21 DIAGNOSIS — S31105S Unspecified open wound of abdominal wall, periumbilic region without penetration into peritoneal cavity, sequela: Secondary | ICD-10-CM | POA: Diagnosis not present

## 2024-03-21 DIAGNOSIS — N186 End stage renal disease: Secondary | ICD-10-CM | POA: Diagnosis not present

## 2024-03-21 MED ORDER — OCTREOTIDE ACETATE 10 MG IM KIT
10.0000 mg | PACK | INTRAMUSCULAR | 5 refills | Status: DC
  Filled 2024-03-21: qty 1, 28d supply, fill #0
  Filled 2024-04-16: qty 1, 28d supply, fill #1

## 2024-03-21 NOTE — Telephone Encounter (Signed)
 Auth Submission: NO AUTH NEEDED Site of care: Site of care: [facility] Payer: [employer] dual complete Medication & CPT/J Code(s) submitted: Sandostatin  (Octreotide  LAR) [id number] Route of submission (phone, fax, portal):  Phone # Fax # Auth type: Buy/Bill PB Units/visits requested:  Reference number:  Approval from: 03/21/24 to 10/23/24

## 2024-03-22 ENCOUNTER — Other Ambulatory Visit: Payer: Self-pay

## 2024-03-22 DIAGNOSIS — N2581 Secondary hyperparathyroidism of renal origin: Secondary | ICD-10-CM | POA: Diagnosis not present

## 2024-03-22 DIAGNOSIS — Z992 Dependence on renal dialysis: Secondary | ICD-10-CM | POA: Diagnosis not present

## 2024-03-22 DIAGNOSIS — N186 End stage renal disease: Secondary | ICD-10-CM | POA: Diagnosis not present

## 2024-03-22 DIAGNOSIS — D631 Anemia in chronic kidney disease: Secondary | ICD-10-CM | POA: Diagnosis not present

## 2024-03-22 NOTE — Progress Notes (Signed)
 The ASCVD Risk score (Arnett DK, et al., 2019) failed to calculate for the following reasons:   Risk score cannot be calculated because patient has a medical history suggesting prior/existing ASCVD  Arlon Bergamo, BSN, RN

## 2024-03-23 DIAGNOSIS — Z992 Dependence on renal dialysis: Secondary | ICD-10-CM | POA: Diagnosis not present

## 2024-03-23 DIAGNOSIS — N186 End stage renal disease: Secondary | ICD-10-CM | POA: Diagnosis not present

## 2024-03-23 DIAGNOSIS — I129 Hypertensive chronic kidney disease with stage 1 through stage 4 chronic kidney disease, or unspecified chronic kidney disease: Secondary | ICD-10-CM | POA: Diagnosis not present

## 2024-03-25 DIAGNOSIS — Z992 Dependence on renal dialysis: Secondary | ICD-10-CM | POA: Diagnosis not present

## 2024-03-25 DIAGNOSIS — D631 Anemia in chronic kidney disease: Secondary | ICD-10-CM | POA: Diagnosis not present

## 2024-03-25 DIAGNOSIS — N186 End stage renal disease: Secondary | ICD-10-CM | POA: Diagnosis not present

## 2024-03-25 DIAGNOSIS — N2581 Secondary hyperparathyroidism of renal origin: Secondary | ICD-10-CM | POA: Diagnosis not present

## 2024-03-26 ENCOUNTER — Telehealth: Payer: Self-pay

## 2024-03-26 ENCOUNTER — Ambulatory Visit (HOSPITAL_COMMUNITY)
Admission: RE | Admit: 2024-03-26 | Discharge: 2024-03-26 | Disposition: A | Discharge status: Home / Self Care | Source: Ambulatory Visit | Attending: Gastroenterology | Admitting: Gastroenterology

## 2024-03-26 DIAGNOSIS — N189 Chronic kidney disease, unspecified: Secondary | ICD-10-CM | POA: Diagnosis not present

## 2024-03-26 DIAGNOSIS — D638 Anemia in other chronic diseases classified elsewhere: Secondary | ICD-10-CM | POA: Diagnosis not present

## 2024-03-26 DIAGNOSIS — Q2730 Arteriovenous malformation, site unspecified: Secondary | ICD-10-CM | POA: Insufficient documentation

## 2024-03-26 DIAGNOSIS — D649 Anemia, unspecified: Secondary | ICD-10-CM | POA: Insufficient documentation

## 2024-03-26 MED ORDER — OCTREOTIDE ACETATE 10 MG IM KIT
10.0000 mg | PACK | INTRAMUSCULAR | Status: DC
  Administered 2024-03-26: 10 mg via INTRAMUSCULAR
  Filled 2024-03-26: qty 1

## 2024-03-26 NOTE — Patient Outreach (Signed)
 RNCM Telephone Encounter Note  Patient was not available to complete scheduled visit due to attending to wound care.  Requested reschedule to 04/04/24 at 9:00   Clarnce Crow BSN RN CCM [place]  [facility], [facility] RN Care Manager Direct Dial : [phone] Fax: [phone]

## 2024-03-27 DIAGNOSIS — N2581 Secondary hyperparathyroidism of renal origin: Secondary | ICD-10-CM | POA: Diagnosis not present

## 2024-03-27 DIAGNOSIS — N186 End stage renal disease: Secondary | ICD-10-CM | POA: Diagnosis not present

## 2024-03-27 DIAGNOSIS — Z992 Dependence on renal dialysis: Secondary | ICD-10-CM | POA: Diagnosis not present

## 2024-03-27 DIAGNOSIS — D631 Anemia in chronic kidney disease: Secondary | ICD-10-CM | POA: Diagnosis not present

## 2024-03-28 ENCOUNTER — Encounter (HOSPITAL_BASED_OUTPATIENT_CLINIC_OR_DEPARTMENT_OTHER): Attending: Internal Medicine | Admitting: Internal Medicine

## 2024-03-28 DIAGNOSIS — L97215 Non-pressure chronic ulcer of right calf with muscle involvement without evidence of necrosis: Secondary | ICD-10-CM | POA: Diagnosis not present

## 2024-03-28 DIAGNOSIS — I87311 Chronic venous hypertension (idiopathic) with ulcer of right lower extremity: Secondary | ICD-10-CM | POA: Diagnosis not present

## 2024-03-28 DIAGNOSIS — L97212 Non-pressure chronic ulcer of right calf with fat layer exposed: Secondary | ICD-10-CM | POA: Diagnosis not present

## 2024-03-28 DIAGNOSIS — I70242 Atherosclerosis of native arteries of left leg with ulceration of calf: Secondary | ICD-10-CM | POA: Insufficient documentation

## 2024-03-28 DIAGNOSIS — L97818 Non-pressure chronic ulcer of other part of right lower leg with other specified severity: Secondary | ICD-10-CM | POA: Insufficient documentation

## 2024-03-29 DIAGNOSIS — N186 End stage renal disease: Secondary | ICD-10-CM | POA: Diagnosis not present

## 2024-03-29 DIAGNOSIS — D631 Anemia in chronic kidney disease: Secondary | ICD-10-CM | POA: Diagnosis not present

## 2024-03-29 DIAGNOSIS — N2581 Secondary hyperparathyroidism of renal origin: Secondary | ICD-10-CM | POA: Diagnosis not present

## 2024-03-29 DIAGNOSIS — Z992 Dependence on renal dialysis: Secondary | ICD-10-CM | POA: Diagnosis not present

## 2024-04-01 DIAGNOSIS — N186 End stage renal disease: Secondary | ICD-10-CM | POA: Diagnosis not present

## 2024-04-01 DIAGNOSIS — D631 Anemia in chronic kidney disease: Secondary | ICD-10-CM | POA: Diagnosis not present

## 2024-04-01 DIAGNOSIS — Z992 Dependence on renal dialysis: Secondary | ICD-10-CM | POA: Diagnosis not present

## 2024-04-01 DIAGNOSIS — N2581 Secondary hyperparathyroidism of renal origin: Secondary | ICD-10-CM | POA: Diagnosis not present

## 2024-04-02 ENCOUNTER — Ambulatory Visit: Admitting: Cardiovascular Disease

## 2024-04-03 DIAGNOSIS — D631 Anemia in chronic kidney disease: Secondary | ICD-10-CM | POA: Diagnosis not present

## 2024-04-03 DIAGNOSIS — Z992 Dependence on renal dialysis: Secondary | ICD-10-CM | POA: Diagnosis not present

## 2024-04-03 DIAGNOSIS — N2581 Secondary hyperparathyroidism of renal origin: Secondary | ICD-10-CM | POA: Diagnosis not present

## 2024-04-03 DIAGNOSIS — N186 End stage renal disease: Secondary | ICD-10-CM | POA: Diagnosis not present

## 2024-04-04 ENCOUNTER — Encounter (HOSPITAL_COMMUNITY): Payer: Self-pay

## 2024-04-04 ENCOUNTER — Emergency Department (HOSPITAL_COMMUNITY)
Admission: EM | Admit: 2024-04-04 | Discharge: 2024-04-04 | Disposition: A | Discharge status: Home / Self Care | Attending: Emergency Medicine | Admitting: Emergency Medicine

## 2024-04-04 ENCOUNTER — Emergency Department (HOSPITAL_COMMUNITY)

## 2024-04-04 ENCOUNTER — Telehealth: Payer: Self-pay

## 2024-04-04 DIAGNOSIS — I509 Heart failure, unspecified: Secondary | ICD-10-CM | POA: Insufficient documentation

## 2024-04-04 DIAGNOSIS — N186 End stage renal disease: Secondary | ICD-10-CM | POA: Diagnosis not present

## 2024-04-04 DIAGNOSIS — R0989 Other specified symptoms and signs involving the circulatory and respiratory systems: Secondary | ICD-10-CM | POA: Diagnosis not present

## 2024-04-04 DIAGNOSIS — I132 Hypertensive heart and chronic kidney disease with heart failure and with stage 5 chronic kidney disease, or end stage renal disease: Secondary | ICD-10-CM | POA: Insufficient documentation

## 2024-04-04 DIAGNOSIS — Z992 Dependence on renal dialysis: Secondary | ICD-10-CM | POA: Diagnosis not present

## 2024-04-04 DIAGNOSIS — I959 Hypotension, unspecified: Secondary | ICD-10-CM | POA: Diagnosis not present

## 2024-04-04 DIAGNOSIS — R531 Weakness: Secondary | ICD-10-CM | POA: Diagnosis not present

## 2024-04-04 DIAGNOSIS — Z21 Asymptomatic human immunodeficiency virus [HIV] infection status: Secondary | ICD-10-CM | POA: Diagnosis not present

## 2024-04-04 DIAGNOSIS — I10 Essential (primary) hypertension: Secondary | ICD-10-CM | POA: Diagnosis not present

## 2024-04-04 DIAGNOSIS — Z743 Need for continuous supervision: Secondary | ICD-10-CM | POA: Diagnosis not present

## 2024-04-04 DIAGNOSIS — R739 Hyperglycemia, unspecified: Secondary | ICD-10-CM | POA: Diagnosis not present

## 2024-04-04 DIAGNOSIS — E1122 Type 2 diabetes mellitus with diabetic chronic kidney disease: Secondary | ICD-10-CM | POA: Insufficient documentation

## 2024-04-04 DIAGNOSIS — J811 Chronic pulmonary edema: Secondary | ICD-10-CM | POA: Diagnosis not present

## 2024-04-04 DIAGNOSIS — Z794 Long term (current) use of insulin: Secondary | ICD-10-CM | POA: Insufficient documentation

## 2024-04-04 DIAGNOSIS — Z79899 Other long term (current) drug therapy: Secondary | ICD-10-CM | POA: Diagnosis not present

## 2024-04-04 DIAGNOSIS — R42 Dizziness and giddiness: Secondary | ICD-10-CM | POA: Diagnosis not present

## 2024-04-04 DIAGNOSIS — R0902 Hypoxemia: Secondary | ICD-10-CM | POA: Diagnosis not present

## 2024-04-04 DIAGNOSIS — I491 Atrial premature depolarization: Secondary | ICD-10-CM | POA: Diagnosis not present

## 2024-04-04 DIAGNOSIS — R0602 Shortness of breath: Secondary | ICD-10-CM | POA: Diagnosis not present

## 2024-04-04 LAB — COMPREHENSIVE METABOLIC PANEL WITH GFR
ALT: 12 U/L (ref 0–44)
AST: 22 U/L (ref 15–41)
Albumin: 3 g/dL — ABNORMAL LOW (ref 3.5–5.0)
Alkaline Phosphatase: 57 U/L (ref 38–126)
Anion gap: 16 — ABNORMAL HIGH (ref 5–15)
BUN: 15 mg/dL (ref 8–23)
CO2: 29 mmol/L (ref 22–32)
Calcium: 8.8 mg/dL — ABNORMAL LOW (ref 8.9–10.3)
Chloride: 93 mmol/L — ABNORMAL LOW (ref 98–111)
Creatinine, Ser: 6.6 mg/dL — ABNORMAL HIGH (ref 0.61–1.24)
GFR, Estimated: 8 mL/min — ABNORMAL LOW (ref >60)
Glucose, Bld: 128 mg/dL — ABNORMAL HIGH (ref 70–99)
Potassium: 3.9 mmol/L (ref 3.5–5.1)
Sodium: 138 mmol/L (ref 135–145)
Total Bilirubin: 0.7 mg/dL (ref 0.0–1.2)
Total Protein: 8.4 g/dL — ABNORMAL HIGH (ref 6.5–8.1)

## 2024-04-04 LAB — CBC WITH DIFFERENTIAL/PLATELET
Abs Immature Granulocytes: 0.03 10*3/uL (ref 0.00–0.07)
Basophils Absolute: 0.1 10*3/uL (ref 0.0–0.1)
Basophils Relative: 1 %
Eosinophils Absolute: 0.2 10*3/uL (ref 0.0–0.5)
Eosinophils Relative: 2 %
HCT: 40.6 % (ref 39.0–52.0)
Hemoglobin: 12.9 g/dL — ABNORMAL LOW (ref 13.0–17.0)
Immature Granulocytes: 0 %
Lymphocytes Relative: 22 %
Lymphs Abs: 1.6 10*3/uL (ref 0.7–4.0)
MCH: 33.5 pg (ref 26.0–34.0)
MCHC: 31.8 g/dL (ref 30.0–36.0)
MCV: 105.5 fL — ABNORMAL HIGH (ref 80.0–100.0)
Monocytes Absolute: 0.8 10*3/uL (ref 0.1–1.0)
Monocytes Relative: 12 %
Neutro Abs: 4.6 10*3/uL (ref 1.7–7.7)
Neutrophils Relative %: 63 %
Platelets: 152 10*3/uL (ref 150–400)
RBC: 3.85 MIL/uL — ABNORMAL LOW (ref 4.22–5.81)
RDW: 15.6 % — ABNORMAL HIGH (ref 11.5–15.5)
WBC: 7.3 10*3/uL (ref 4.0–10.5)
nRBC: 0 % (ref 0.0–0.2)

## 2024-04-04 LAB — CBG MONITORING, ED: Glucose-Capillary: 123 mg/dL — ABNORMAL HIGH (ref 70–99)

## 2024-04-04 MED ORDER — SODIUM CHLORIDE 0.9 % IV BOLUS
250.0000 mL | Freq: Once | INTRAVENOUS | Status: AC
  Administered 2024-04-04: 250 mL via INTRAVENOUS

## 2024-04-04 NOTE — ED Notes (Signed)
 R leg wound dressed with nonadherent dressing, abd pad, and wrapped.

## 2024-04-04 NOTE — Discharge Instructions (Addendum)
 Today you were seen for hypotension and dizziness.  Please maintain adequate oral hydration.  Please follow-up with your primary care if your symptoms persist for further evaluation and workup.  Thank you for letting us  treat you today. After reviewing your labs and imaging, I feel you are safe to go home. Please follow up with your PCP in the next several days and provide them with your records from this visit. Return to the Emergency Room if pain becomes severe or symptoms worsen.

## 2024-04-04 NOTE — ED Triage Notes (Signed)
 Medic was called out for dressing a wound on his leg, Medic found the be 72 palp unpon arrival, he does normally run low but not that low. He had dialysis this morning. Has been dizzy and lightheaded tonight for the last couple hours.   Medic vitals   84 palp  80hr 80rr 96%ra

## 2024-04-04 NOTE — ED Provider Notes (Signed)
 [place] EMERGENCY DEPARTMENT AT [facility] Provider Note   CSN: [id number] Arrival date & time: 04/04/24  4098     History  Chief Complaint  Patient presents with   Hypotension    Jacob [name] is a 73 y.o. male presents today after EMS was called out for a wound dressing change on his right lower extremity.  Patient was found to be hypotensive on arrival.  Patient has endorsed dizziness and lightheadedness times a couple hours.  Patient denies fever, chills, nausea, vomiting, chest pain, shortness of breath, diarrhea, abdominal pain, any other complaints at this time.  HPI     Home Medications Prior to Admission medications   Medication Sig Start Date End Date Taking? Authorizing Provider  acetaminophen  (TYLENOL ) 650 MG CR tablet Take 1,300 mg by mouth 3 (three) times daily.    [provider]  albuterol  (PROVENTIL ) (2.5 MG/3ML) 0.083% nebulizer solution Take 3 mLs (2.5 mg total) by nebulization every 6 (six) hours as needed for wheezing or shortness of breath. 06/20/23 06/19/24  [name], Dinah C, NP  allopurinol  (ZYLOPRIM ) 100 MG tablet TAKE 1 TABLET BY MOUTH DAILY 11/01/23   [name] Gall, MD  atorvastatin  (LIPITOR ) 10 MG tablet TAKE 1 TABLET(10 MG) BY MOUTH DAILY. 11/23/23   Gerald Kitty., NP  BIKTARVY  50-200-25 MG TABS tablet TAKE 1 TABLET BY MOUTH 1 TIME A DAY 01/04/24   Lina Render, MD  Calcium  Acetate 667 MG TABS Take 667-2,001 mg by mouth See admin instructions. Take 2001 mg by mouth three times a day with meals and 667 mg with each snack 01/20/21   [provider]  carvedilol  (COREG ) 3.125 MG tablet TAKE 1 TABLET(3.125 MG) BY MOUTH TWICE DAILY WITH A MEAL 11/28/23   Dick, Ernest H Jr., NP  diclofenac  Sodium (VOLTAREN ) 1 % GEL Apply 2 grams topically to the affected area three times daily as needed for pain. 12/19/22   Doreen Gamma, MD  gabapentin  (NEURONTIN ) 300 MG capsule Take 1 capsule (300 mg total) by mouth in the morning and  at bedtime. Patient is taking 1 tablet in the early evening after dialysis and 1 tablet at bedtime 03/01/24   de Peru, Alonza Jansky, MD  HUMALOG  KWIKPEN 100 UNIT/ML KwikPen Inject 15 Units into the skin 3 (three) times daily. 01/02/24   [name] Gall, MD  insulin  degludec (TRESIBA ) 100 UNIT/ML FlexTouch Pen Inject 15 Units into the skin daily. 3 01/02/24   [name] Gall, MD  isosorbide  mononitrate (IMDUR ) 30 MG 24 hr tablet Take 1 tablet (30 mg total) by mouth daily. 06/05/23   Doreen Gamma, MD  latanoprost  (XALATAN ) 0.005 % ophthalmic solution Place 1 drop into both eyes at bedtime.    [provider]  leptospermum manuka honey (MEDIHONEY) PSTE paste Apply 1 Application topically daily. Cleanse R lower leg wound with Vashe wound cleanser Timm Foot [phone]), do not rinse and allow to air dry. Apply Medihoney to wound bed daily, cover with dry gauze and cover with silicone foam or ABD pad and Kerlix roll gauze to secure.Interchangeable with TheraHoney Apply thin layer (3 mm) to wound. 01/25/24   Hongalgi, Anand D, MD  lidocaine -prilocaine  (EMLA ) cream Apply topically as directed. At dialysis 10/30/23   [provider]  Menthol -Camphor (TIGER BALM ARTHRITIS RUB) 11-11 % CREA Apply 1 Application topically as needed (pain).    [provider]  midodrine  (PROAMATINE ) 10 MG tablet Take 10 mg by mouth 3 (three) times a week. 03/21/23  [provider]  multivitamin (RENA-VIT) TABS tablet Take 1 tablet by mouth daily.    [provider]  neomycin-bacitracin-polymyxin [phone] OINT Apply 1 Application topically in the morning and at bedtime. 12/26/23   [name] Gall, MD  nitroGLYCERIN  (NITROSTAT ) 0.3 MG SL tablet Place 1 tablet (0.3 mg total) under the tongue every 5 (five) minutes as needed for chest pain. 02/27/24   Nishan, Peter C, MD  octreotide  (SANDOSTATIN  LAR DEPOT) 10 MG injection Inject 10 mg into the muscle every 28 days. 03/21/24   McMichael,  Elba Greathouse, PA-C  oxyCODONE -acetaminophen  (PERCOCET) 5-325 MG tablet Take 1 tablet by mouth every 8 (eight) hours as needed for severe pain (pain score 7-10). 03/05/24   Lylia Sand, MD  polyethylene glycol (MIRALAX  / GLYCOLAX ) 17 g packet Take 17 g by mouth daily as needed for moderate constipation.    [provider]  PREVIDENT 5000 PLUS 1.1 % CREA dental cream Place 1 Application onto teeth at bedtime. 05/25/23   [provider]  simethicone  (MYLICON) 125 MG chewable tablet Chew 125 mg by mouth as needed for flatulence.    [provider]  timolol  (TIMOPTIC ) 0.5 % ophthalmic solution Place 1 drop into both eyes in the morning. 04/04/19   [provider]  umeclidinium-vilanterol (ANORO ELLIPTA ) 62.5-25 MCG/ACT AEPB Inhale 1 puff into the lungs daily. 06/07/23   Doreen Gamma, MD      Allergies    Augmentin  [amoxicillin -pot clavulanate], Morphine  sulfate, Mucinex  fast-max, and Amphetamines    Review of Systems   Review of Systems  Neurological:  Positive for dizziness and light-headedness.    Physical Exam Updated Vital Signs BP 124/65   Pulse 83   Temp 97.7 F (36.5 C)   Resp 14   SpO2 100%  Physical Exam Vitals and nursing note reviewed.  Constitutional:      General: He is not in acute distress.    Appearance: Normal appearance. He is well-developed. He is not toxic-appearing.  HENT:     Head: Normocephalic and atraumatic.     Right Ear: External ear normal.     Left Ear: External ear normal.     Nose: Nose normal.     Mouth/Throat:     Mouth: Mucous membranes are moist.   Eyes:     Extraocular Movements: Extraocular movements intact.     Conjunctiva/sclera: Conjunctivae normal.    Cardiovascular:     Rate and Rhythm: Normal rate and regular rhythm.     Pulses: Normal pulses.     Heart sounds: Normal heart sounds. No murmur heard.    Comments: Dialysis fistula noted to the patient's right upper extremity with strong palpable  thrill Pulmonary:     Effort: Pulmonary effort is normal. No respiratory distress.     Breath sounds: Normal breath sounds.  Abdominal:     Palpations: Abdomen is soft.     Tenderness: There is no abdominal tenderness.   Musculoskeletal:        General: No swelling.     Cervical back: Normal range of motion and neck supple.   Skin:    General: Skin is warm and dry.     Capillary Refill: Capillary refill takes less than 2 seconds.     Comments: Patient with chronic wound to anterior medial right lower extremity to the muscle without obvious signs of infection to include purulent discharge, erythema, or warmth.   Neurological:     General: No focal deficit present.  Mental Status: He is alert and oriented to person, place, and time.     Sensory: No sensory deficit.   Psychiatric:        Mood and Affect: Mood normal.     ED Results / Procedures / Treatments   Labs (all labs ordered are listed, but only abnormal results are displayed) Labs Reviewed  COMPREHENSIVE METABOLIC PANEL WITH GFR - Abnormal; Notable for the following components:      Result Value   Chloride 93 (*)    Glucose, Bld 128 (*)    Creatinine, Ser 6.60 (*)    Calcium  8.8 (*)    Total Protein 8.4 (*)    Albumin  3.0 (*)    GFR, Estimated 8 (*)    Anion gap 16 (*)    All other components within normal limits  CBC WITH DIFFERENTIAL/PLATELET - Abnormal; Notable for the following components:   RBC 3.85 (*)    Hemoglobin 12.9 (*)    MCV 105.5 (*)    RDW 15.6 (*)    All other components within normal limits  URINALYSIS, ROUTINE W REFLEX MICROSCOPIC    EKG EKG Interpretation Date/Time:  Thursday April 04 2024 02:36:05 EDT Ventricular Rate:  83 PR Interval:  205 QRS Duration:  149 QT Interval:  465 QTC Calculation: 547 R Axis:   -33  Text Interpretation: Sinus rhythm IVCD, consider atypical RBBB Anterolateral infarct, age indeterminate Similar to April 2025 tracing Confirmed by Abby Hocking [phone])  on 04/04/2024 2:39:16 AM  Radiology DG Chest Portable 1 View Result Date: 04/04/2024 CLINICAL DATA:  Shortness of breath EXAM: PORTABLE CHEST 1 VIEW COMPARISON:  01/23/2024 FINDINGS: Cardiac shadow is enlarged but stable. Aortic calcifications are seen. Postsurgical changes are noted. Central vascular congestion is seen with mild interstitial edema. No focal confluent infiltrate is noted. No bony abnormality is seen. IMPRESSION: Changes of CHF. Electronically Signed   By: Violeta Grey M.D.   On: 04/04/2024 03:13    Procedures Procedures    Medications Ordered in ED Medications  sodium chloride  0.9 % bolus 250 mL (0 mLs Intravenous Stopped 04/04/24 0454)    ED Course/ Medical Decision Making/ A&P                                 Medical Decision Making Amount and/or Complexity of Data Reviewed Labs: ordered. Radiology: ordered.   This patient presents to the ED for concern of hypotension, this involves an extensive number of treatment options, and is a complaint that carries with it a high risk of complications and morbidity.  The differential diagnosis includes UTI, arrhythmia, electrolyte abnormality, anemia, vasovagal syncope   Co morbidities / Chronic conditions that complicate the patient evaluation  HIV, hypertension, diabetes, ESRD, CHF   Additional history obtained:  Additional history obtained from EMR External records from outside source obtained and reviewed including wound care notes   Lab Tests:  I Ordered, and personally interpreted labs.  The pertinent results include: Anemia at 12.9 which is chronic, creatinine at 6.6, GFR 8 consistent with ESRD.  Mildly decreased calcium  at 8.8, mildly elevated anion gap at 16, mildly decreased albumin  at 3.0   Imaging Studies ordered:  I ordered imaging studies including chest x-ray  I independently visualized and interpreted imaging which showed changes of CHF I agree with the radiologist interpretation   Cardiac  Monitoring: / EKG:  The patient was maintained on a cardiac monitor.  I personally  viewed and interpreted the cardiac monitored which showed an underlying rhythm of: sinus rhythm   Problem List / ED Course / Critical interventions / Medication management I ordered medication including IVF   Reevaluation of the patient after these medicines showed that the patient stayed the same I have reviewed the patients home medicines and have made adjustments as needed Orthostatic negative  Test / Admission - Considered:  Considered for admission further workup however patient's vital signs, physical exam, labs, imaging and been reassuring.  Patient to follow-up with primary care and dialysis for normal routine dialysis.  Patient given return precautions.  I feel patient safe for discharge at this time.        Final Clinical Impression(s) / ED Diagnoses Final diagnoses:  Hypotension, unspecified hypotension type    Rx / DC Orders ED Discharge Orders     None         Carie Charity, PA-C 04/04/24 0545    Roberts Ching, MD 04/05/24 [phone]

## 2024-04-04 NOTE — ED Notes (Signed)
 [facility] called, pt is next on list

## 2024-04-05 DIAGNOSIS — Z992 Dependence on renal dialysis: Secondary | ICD-10-CM | POA: Diagnosis not present

## 2024-04-05 DIAGNOSIS — N186 End stage renal disease: Secondary | ICD-10-CM | POA: Diagnosis not present

## 2024-04-05 DIAGNOSIS — N2581 Secondary hyperparathyroidism of renal origin: Secondary | ICD-10-CM | POA: Diagnosis not present

## 2024-04-05 DIAGNOSIS — D631 Anemia in chronic kidney disease: Secondary | ICD-10-CM | POA: Diagnosis not present

## 2024-04-08 DIAGNOSIS — N186 End stage renal disease: Secondary | ICD-10-CM | POA: Diagnosis not present

## 2024-04-08 DIAGNOSIS — N2581 Secondary hyperparathyroidism of renal origin: Secondary | ICD-10-CM | POA: Diagnosis not present

## 2024-04-08 DIAGNOSIS — Z992 Dependence on renal dialysis: Secondary | ICD-10-CM | POA: Diagnosis not present

## 2024-04-08 DIAGNOSIS — D631 Anemia in chronic kidney disease: Secondary | ICD-10-CM | POA: Diagnosis not present

## 2024-04-09 ENCOUNTER — Encounter (HOSPITAL_BASED_OUTPATIENT_CLINIC_OR_DEPARTMENT_OTHER): Admitting: Internal Medicine

## 2024-04-09 ENCOUNTER — Ambulatory Visit: Admitting: Sports Medicine

## 2024-04-09 DIAGNOSIS — I70242 Atherosclerosis of native arteries of left leg with ulceration of calf: Secondary | ICD-10-CM

## 2024-04-09 DIAGNOSIS — L97818 Non-pressure chronic ulcer of other part of right lower leg with other specified severity: Secondary | ICD-10-CM | POA: Diagnosis not present

## 2024-04-09 DIAGNOSIS — I87311 Chronic venous hypertension (idiopathic) with ulcer of right lower extremity: Secondary | ICD-10-CM | POA: Diagnosis not present

## 2024-04-09 DIAGNOSIS — E1151 Type 2 diabetes mellitus with diabetic peripheral angiopathy without gangrene: Secondary | ICD-10-CM | POA: Diagnosis not present

## 2024-04-10 DIAGNOSIS — D631 Anemia in chronic kidney disease: Secondary | ICD-10-CM | POA: Diagnosis not present

## 2024-04-10 DIAGNOSIS — N2581 Secondary hyperparathyroidism of renal origin: Secondary | ICD-10-CM | POA: Diagnosis not present

## 2024-04-10 DIAGNOSIS — N186 End stage renal disease: Secondary | ICD-10-CM | POA: Diagnosis not present

## 2024-04-10 DIAGNOSIS — Z992 Dependence on renal dialysis: Secondary | ICD-10-CM | POA: Diagnosis not present

## 2024-04-12 ENCOUNTER — Telehealth (HOSPITAL_BASED_OUTPATIENT_CLINIC_OR_DEPARTMENT_OTHER): Payer: Self-pay | Admitting: *Deleted

## 2024-04-12 ENCOUNTER — Telehealth: Payer: Self-pay

## 2024-04-12 DIAGNOSIS — N186 End stage renal disease: Secondary | ICD-10-CM | POA: Diagnosis not present

## 2024-04-12 DIAGNOSIS — I5042 Chronic combined systolic (congestive) and diastolic (congestive) heart failure: Secondary | ICD-10-CM

## 2024-04-12 DIAGNOSIS — Z992 Dependence on renal dialysis: Secondary | ICD-10-CM | POA: Diagnosis not present

## 2024-04-12 DIAGNOSIS — N2581 Secondary hyperparathyroidism of renal origin: Secondary | ICD-10-CM | POA: Diagnosis not present

## 2024-04-12 DIAGNOSIS — D631 Anemia in chronic kidney disease: Secondary | ICD-10-CM | POA: Diagnosis not present

## 2024-04-12 MED ORDER — OXYCODONE-ACETAMINOPHEN 5-325 MG PO TABS: 1.0000 | ORAL_TABLET | Freq: Three times a day (TID) | ORAL | 0 refills | Status: DC | PRN

## 2024-04-12 MED ORDER — ISOSORBIDE MONONITRATE ER 30 MG PO TB24: 30.0000 mg | ORAL_TABLET | Freq: Every day | ORAL | 0 refills | Status: DC

## 2024-04-12 NOTE — Addendum Note (Signed)
 Addended by: DE Peru, Shironda Kain J on: 04/12/2024 01:29 PM   Modules accepted: Orders

## 2024-04-12 NOTE — Telephone Encounter (Signed)
 Refill request for Oxycodone .   Filled  Written  ID  Drug  QTY  Days  Prescriber  RX #  Dispenser  Refill  Daily Dose*  Pymt Type  PMP  03/08/2024 03/05/2024 1  Oxycodone -Acetaminophen  5-325 90.00 30 Yu Sht [id number] [facility] [phone]) 0/0 22.50 MME Medicare [place] 03/01/2024 03/01/2024 1  Gabapentin  300 Mg Capsule 60.00 30 Ra De [id number] [facility] [phone]) 0/0  Medicare [place]

## 2024-04-12 NOTE — Telephone Encounter (Signed)
 Patient requesting isosorbide  mononitrate refill to be sent to [facility] Has not been filled by you before would you like to refill?

## 2024-04-15 ENCOUNTER — Telehealth (HOSPITAL_BASED_OUTPATIENT_CLINIC_OR_DEPARTMENT_OTHER): Payer: Self-pay | Admitting: *Deleted

## 2024-04-15 ENCOUNTER — Other Ambulatory Visit (HOSPITAL_BASED_OUTPATIENT_CLINIC_OR_DEPARTMENT_OTHER): Payer: Self-pay | Admitting: *Deleted

## 2024-04-15 DIAGNOSIS — N2581 Secondary hyperparathyroidism of renal origin: Secondary | ICD-10-CM | POA: Diagnosis not present

## 2024-04-15 DIAGNOSIS — Z992 Dependence on renal dialysis: Secondary | ICD-10-CM | POA: Diagnosis not present

## 2024-04-15 DIAGNOSIS — D631 Anemia in chronic kidney disease: Secondary | ICD-10-CM | POA: Diagnosis not present

## 2024-04-15 DIAGNOSIS — N186 End stage renal disease: Secondary | ICD-10-CM | POA: Diagnosis not present

## 2024-04-15 MED ORDER — BD PEN NEEDLE MINI U/F 31G X 5 MM MISC
1.0000 | Freq: Four times a day (QID) | 1 refills | Status: DC
Start: 2024-04-15 — End: 2024-04-28

## 2024-04-15 NOTE — Telephone Encounter (Signed)
 Copied from CRM [id number]. Topic: Clinical - Prescription Issue >> Apr 12, 2024 12:36 PM Delon DASEN wrote: Reason for CRM: checking on status of his needles, pharmacy states they are waiting for approval from Dr Everitt Peru for a few refills

## 2024-04-15 NOTE — Telephone Encounter (Signed)
Pen Needles sent to pharmacy.  

## 2024-04-16 ENCOUNTER — Other Ambulatory Visit: Payer: Self-pay

## 2024-04-16 ENCOUNTER — Encounter (HOSPITAL_BASED_OUTPATIENT_CLINIC_OR_DEPARTMENT_OTHER): Admitting: Internal Medicine

## 2024-04-16 DIAGNOSIS — D631 Anemia in chronic kidney disease: Secondary | ICD-10-CM | POA: Diagnosis not present

## 2024-04-16 DIAGNOSIS — Z66 Do not resuscitate: Secondary | ICD-10-CM | POA: Diagnosis not present

## 2024-04-16 DIAGNOSIS — E440 Moderate protein-calorie malnutrition: Secondary | ICD-10-CM | POA: Diagnosis not present

## 2024-04-16 DIAGNOSIS — G9341 Metabolic encephalopathy: Secondary | ICD-10-CM | POA: Diagnosis not present

## 2024-04-16 DIAGNOSIS — E1151 Type 2 diabetes mellitus with diabetic peripheral angiopathy without gangrene: Secondary | ICD-10-CM | POA: Diagnosis not present

## 2024-04-16 DIAGNOSIS — L97818 Non-pressure chronic ulcer of other part of right lower leg with other specified severity: Secondary | ICD-10-CM | POA: Diagnosis not present

## 2024-04-16 DIAGNOSIS — I739 Peripheral vascular disease, unspecified: Secondary | ICD-10-CM | POA: Diagnosis not present

## 2024-04-16 DIAGNOSIS — I63512 Cerebral infarction due to unspecified occlusion or stenosis of left middle cerebral artery: Secondary | ICD-10-CM | POA: Diagnosis not present

## 2024-04-16 DIAGNOSIS — I6381 Other cerebral infarction due to occlusion or stenosis of small artery: Secondary | ICD-10-CM | POA: Diagnosis not present

## 2024-04-16 DIAGNOSIS — I70242 Atherosclerosis of native arteries of left leg with ulceration of calf: Secondary | ICD-10-CM | POA: Diagnosis not present

## 2024-04-16 DIAGNOSIS — I132 Hypertensive heart and chronic kidney disease with heart failure and with stage 5 chronic kidney disease, or end stage renal disease: Secondary | ICD-10-CM | POA: Diagnosis not present

## 2024-04-16 DIAGNOSIS — E1122 Type 2 diabetes mellitus with diabetic chronic kidney disease: Secondary | ICD-10-CM | POA: Diagnosis not present

## 2024-04-16 DIAGNOSIS — Z992 Dependence on renal dialysis: Secondary | ICD-10-CM | POA: Diagnosis not present

## 2024-04-16 DIAGNOSIS — N186 End stage renal disease: Secondary | ICD-10-CM | POA: Diagnosis not present

## 2024-04-16 DIAGNOSIS — Z515 Encounter for palliative care: Secondary | ICD-10-CM | POA: Diagnosis not present

## 2024-04-16 DIAGNOSIS — S81801A Unspecified open wound, right lower leg, initial encounter: Secondary | ICD-10-CM | POA: Diagnosis not present

## 2024-04-16 DIAGNOSIS — N2581 Secondary hyperparathyroidism of renal origin: Secondary | ICD-10-CM | POA: Diagnosis not present

## 2024-04-16 DIAGNOSIS — J449 Chronic obstructive pulmonary disease, unspecified: Secondary | ICD-10-CM | POA: Diagnosis not present

## 2024-04-16 DIAGNOSIS — I253 Aneurysm of heart: Secondary | ICD-10-CM | POA: Diagnosis not present

## 2024-04-16 DIAGNOSIS — I87311 Chronic venous hypertension (idiopathic) with ulcer of right lower extremity: Secondary | ICD-10-CM | POA: Diagnosis not present

## 2024-04-16 DIAGNOSIS — I70221 Atherosclerosis of native arteries of extremities with rest pain, right leg: Secondary | ICD-10-CM | POA: Diagnosis not present

## 2024-04-16 DIAGNOSIS — I5042 Chronic combined systolic (congestive) and diastolic (congestive) heart failure: Secondary | ICD-10-CM | POA: Diagnosis not present

## 2024-04-16 DIAGNOSIS — I480 Paroxysmal atrial fibrillation: Secondary | ICD-10-CM | POA: Diagnosis not present

## 2024-04-16 DIAGNOSIS — E1142 Type 2 diabetes mellitus with diabetic polyneuropathy: Secondary | ICD-10-CM | POA: Diagnosis not present

## 2024-04-16 DIAGNOSIS — I120 Hypertensive chronic kidney disease with stage 5 chronic kidney disease or end stage renal disease: Secondary | ICD-10-CM | POA: Diagnosis not present

## 2024-04-16 DIAGNOSIS — K921 Melena: Secondary | ICD-10-CM | POA: Diagnosis not present

## 2024-04-16 DIAGNOSIS — E1165 Type 2 diabetes mellitus with hyperglycemia: Secondary | ICD-10-CM | POA: Diagnosis not present

## 2024-04-16 NOTE — Progress Notes (Signed)
 Specialty Pharmacy Refill Coordination Note  JAQUAIL MCLEES is a 73 y.o. male assessed today regarding refills of clinic administered specialty medication(s) Octreotide  Acetate (SANDOSTATIN  LAR)   Clinic requested Courier to Provider Office   Delivery date: 04/24/24   Injection date: 05/02/24  Verified address: [facility]   Medication will be filled on 04/23/24.

## 2024-04-17 ENCOUNTER — Encounter (HOSPITAL_COMMUNITY): Payer: Self-pay | Admitting: Family Medicine

## 2024-04-17 ENCOUNTER — Inpatient Hospital Stay (HOSPITAL_COMMUNITY)
Admission: EM | Admit: 2024-04-17 | Discharge: 2024-04-28 | DRG: 278 | Disposition: A | Discharge status: Hospice | Attending: Internal Medicine | Admitting: Internal Medicine

## 2024-04-17 ENCOUNTER — Other Ambulatory Visit: Payer: Self-pay

## 2024-04-17 DIAGNOSIS — I6381 Other cerebral infarction due to occlusion or stenosis of small artery: Secondary | ICD-10-CM | POA: Diagnosis not present

## 2024-04-17 DIAGNOSIS — G9341 Metabolic encephalopathy: Secondary | ICD-10-CM | POA: Diagnosis present

## 2024-04-17 DIAGNOSIS — E1122 Type 2 diabetes mellitus with diabetic chronic kidney disease: Secondary | ICD-10-CM | POA: Diagnosis present

## 2024-04-17 DIAGNOSIS — I132 Hypertensive heart and chronic kidney disease with heart failure and with stage 5 chronic kidney disease, or end stage renal disease: Secondary | ICD-10-CM | POA: Diagnosis present

## 2024-04-17 DIAGNOSIS — I2510 Atherosclerotic heart disease of native coronary artery without angina pectoris: Secondary | ICD-10-CM | POA: Diagnosis present

## 2024-04-17 DIAGNOSIS — F1490 Cocaine use, unspecified, uncomplicated: Secondary | ICD-10-CM | POA: Diagnosis present

## 2024-04-17 DIAGNOSIS — I5022 Chronic systolic (congestive) heart failure: Secondary | ICD-10-CM | POA: Insufficient documentation

## 2024-04-17 DIAGNOSIS — E1151 Type 2 diabetes mellitus with diabetic peripheral angiopathy without gangrene: Secondary | ICD-10-CM | POA: Diagnosis present

## 2024-04-17 DIAGNOSIS — E1142 Type 2 diabetes mellitus with diabetic polyneuropathy: Secondary | ICD-10-CM | POA: Diagnosis present

## 2024-04-17 DIAGNOSIS — R0902 Hypoxemia: Secondary | ICD-10-CM | POA: Diagnosis present

## 2024-04-17 DIAGNOSIS — Z515 Encounter for palliative care: Secondary | ICD-10-CM | POA: Diagnosis not present

## 2024-04-17 DIAGNOSIS — R1310 Dysphagia, unspecified: Secondary | ICD-10-CM | POA: Diagnosis not present

## 2024-04-17 DIAGNOSIS — D638 Anemia in other chronic diseases classified elsewhere: Secondary | ICD-10-CM | POA: Diagnosis present

## 2024-04-17 DIAGNOSIS — M109 Gout, unspecified: Secondary | ICD-10-CM | POA: Diagnosis present

## 2024-04-17 DIAGNOSIS — J449 Chronic obstructive pulmonary disease, unspecified: Secondary | ICD-10-CM | POA: Diagnosis present

## 2024-04-17 DIAGNOSIS — Z880 Allergy status to penicillin: Secondary | ICD-10-CM

## 2024-04-17 DIAGNOSIS — E785 Hyperlipidemia, unspecified: Secondary | ICD-10-CM | POA: Diagnosis present

## 2024-04-17 DIAGNOSIS — Z820 Family history of epilepsy and other diseases of the nervous system: Secondary | ICD-10-CM

## 2024-04-17 DIAGNOSIS — R29703 NIHSS score 3: Secondary | ICD-10-CM | POA: Diagnosis not present

## 2024-04-17 DIAGNOSIS — I255 Ischemic cardiomyopathy: Secondary | ICD-10-CM | POA: Diagnosis present

## 2024-04-17 DIAGNOSIS — B20 Human immunodeficiency virus [HIV] disease: Secondary | ICD-10-CM | POA: Diagnosis present

## 2024-04-17 DIAGNOSIS — I700 Atherosclerosis of aorta: Secondary | ICD-10-CM | POA: Diagnosis not present

## 2024-04-17 DIAGNOSIS — K219 Gastro-esophageal reflux disease without esophagitis: Secondary | ICD-10-CM | POA: Diagnosis present

## 2024-04-17 DIAGNOSIS — K625 Hemorrhage of anus and rectum: Secondary | ICD-10-CM

## 2024-04-17 DIAGNOSIS — I5042 Chronic combined systolic (congestive) and diastolic (congestive) heart failure: Secondary | ICD-10-CM | POA: Diagnosis present

## 2024-04-17 DIAGNOSIS — D631 Anemia in chronic kidney disease: Secondary | ICD-10-CM | POA: Diagnosis present

## 2024-04-17 DIAGNOSIS — I253 Aneurysm of heart: Secondary | ICD-10-CM | POA: Diagnosis present

## 2024-04-17 DIAGNOSIS — I63512 Cerebral infarction due to unspecified occlusion or stenosis of left middle cerebral artery: Secondary | ICD-10-CM | POA: Diagnosis not present

## 2024-04-17 DIAGNOSIS — S81801A Unspecified open wound, right lower leg, initial encounter: Secondary | ICD-10-CM | POA: Diagnosis not present

## 2024-04-17 DIAGNOSIS — K921 Melena: Secondary | ICD-10-CM | POA: Diagnosis not present

## 2024-04-17 DIAGNOSIS — B1920 Unspecified viral hepatitis C without hepatic coma: Secondary | ICD-10-CM | POA: Diagnosis present

## 2024-04-17 DIAGNOSIS — Z794 Long term (current) use of insulin: Secondary | ICD-10-CM

## 2024-04-17 DIAGNOSIS — I70223 Atherosclerosis of native arteries of extremities with rest pain, bilateral legs: Secondary | ICD-10-CM | POA: Diagnosis not present

## 2024-04-17 DIAGNOSIS — Z66 Do not resuscitate: Secondary | ICD-10-CM | POA: Diagnosis present

## 2024-04-17 DIAGNOSIS — I509 Heart failure, unspecified: Secondary | ICD-10-CM | POA: Diagnosis not present

## 2024-04-17 DIAGNOSIS — Z7902 Long term (current) use of antithrombotics/antiplatelets: Secondary | ICD-10-CM

## 2024-04-17 DIAGNOSIS — E119 Type 2 diabetes mellitus without complications: Secondary | ICD-10-CM

## 2024-04-17 DIAGNOSIS — H409 Unspecified glaucoma: Secondary | ICD-10-CM | POA: Diagnosis present

## 2024-04-17 DIAGNOSIS — N2581 Secondary hyperparathyroidism of renal origin: Secondary | ICD-10-CM | POA: Diagnosis present

## 2024-04-17 DIAGNOSIS — E1165 Type 2 diabetes mellitus with hyperglycemia: Secondary | ICD-10-CM | POA: Diagnosis present

## 2024-04-17 DIAGNOSIS — Z888 Allergy status to other drugs, medicaments and biological substances status: Secondary | ICD-10-CM

## 2024-04-17 DIAGNOSIS — I10 Essential (primary) hypertension: Secondary | ICD-10-CM | POA: Diagnosis present

## 2024-04-17 DIAGNOSIS — I739 Peripheral vascular disease, unspecified: Secondary | ICD-10-CM | POA: Diagnosis present

## 2024-04-17 DIAGNOSIS — I639 Cerebral infarction, unspecified: Secondary | ICD-10-CM | POA: Insufficient documentation

## 2024-04-17 DIAGNOSIS — N186 End stage renal disease: Secondary | ICD-10-CM | POA: Diagnosis present

## 2024-04-17 DIAGNOSIS — E440 Moderate protein-calorie malnutrition: Secondary | ICD-10-CM | POA: Diagnosis present

## 2024-04-17 DIAGNOSIS — Z885 Allergy status to narcotic agent status: Secondary | ICD-10-CM

## 2024-04-17 DIAGNOSIS — F17210 Nicotine dependence, cigarettes, uncomplicated: Secondary | ICD-10-CM | POA: Diagnosis not present

## 2024-04-17 DIAGNOSIS — I120 Hypertensive chronic kidney disease with stage 5 chronic kidney disease or end stage renal disease: Secondary | ICD-10-CM | POA: Diagnosis not present

## 2024-04-17 DIAGNOSIS — Z7189 Other specified counseling: Secondary | ICD-10-CM | POA: Diagnosis not present

## 2024-04-17 DIAGNOSIS — Z8774 Personal history of (corrected) congenital malformations of heart and circulatory system: Secondary | ICD-10-CM

## 2024-04-17 DIAGNOSIS — I252 Old myocardial infarction: Secondary | ICD-10-CM

## 2024-04-17 DIAGNOSIS — I480 Paroxysmal atrial fibrillation: Secondary | ICD-10-CM | POA: Diagnosis present

## 2024-04-17 DIAGNOSIS — I69391 Dysphagia following cerebral infarction: Secondary | ICD-10-CM | POA: Diagnosis not present

## 2024-04-17 DIAGNOSIS — Z951 Presence of aortocoronary bypass graft: Secondary | ICD-10-CM

## 2024-04-17 DIAGNOSIS — J811 Chronic pulmonary edema: Secondary | ICD-10-CM | POA: Diagnosis not present

## 2024-04-17 DIAGNOSIS — G459 Transient cerebral ischemic attack, unspecified: Secondary | ICD-10-CM | POA: Diagnosis not present

## 2024-04-17 DIAGNOSIS — R569 Unspecified convulsions: Secondary | ICD-10-CM | POA: Diagnosis not present

## 2024-04-17 DIAGNOSIS — I517 Cardiomegaly: Secondary | ICD-10-CM | POA: Diagnosis not present

## 2024-04-17 DIAGNOSIS — I6389 Other cerebral infarction: Secondary | ICD-10-CM | POA: Diagnosis not present

## 2024-04-17 DIAGNOSIS — F0390 Unspecified dementia without behavioral disturbance: Secondary | ICD-10-CM | POA: Diagnosis present

## 2024-04-17 DIAGNOSIS — Z823 Family history of stroke: Secondary | ICD-10-CM

## 2024-04-17 DIAGNOSIS — I70221 Atherosclerosis of native arteries of extremities with rest pain, right leg: Secondary | ICD-10-CM | POA: Diagnosis present

## 2024-04-17 DIAGNOSIS — Z992 Dependence on renal dialysis: Secondary | ICD-10-CM | POA: Diagnosis not present

## 2024-04-17 DIAGNOSIS — E875 Hyperkalemia: Secondary | ICD-10-CM | POA: Diagnosis not present

## 2024-04-17 DIAGNOSIS — G9340 Encephalopathy, unspecified: Secondary | ICD-10-CM | POA: Diagnosis not present

## 2024-04-17 DIAGNOSIS — F919 Conduct disorder, unspecified: Secondary | ICD-10-CM | POA: Diagnosis present

## 2024-04-17 DIAGNOSIS — R4182 Altered mental status, unspecified: Secondary | ICD-10-CM | POA: Diagnosis not present

## 2024-04-17 DIAGNOSIS — Z6821 Body mass index (BMI) 21.0-21.9, adult: Secondary | ICD-10-CM

## 2024-04-17 DIAGNOSIS — I672 Cerebral atherosclerosis: Secondary | ICD-10-CM | POA: Diagnosis not present

## 2024-04-17 DIAGNOSIS — M62838 Other muscle spasm: Secondary | ICD-10-CM | POA: Diagnosis present

## 2024-04-17 DIAGNOSIS — Z8619 Personal history of other infectious and parasitic diseases: Secondary | ICD-10-CM | POA: Insufficient documentation

## 2024-04-17 DIAGNOSIS — M79604 Pain in right leg: Secondary | ICD-10-CM | POA: Diagnosis not present

## 2024-04-17 DIAGNOSIS — I25118 Atherosclerotic heart disease of native coronary artery with other forms of angina pectoris: Secondary | ICD-10-CM | POA: Diagnosis present

## 2024-04-17 DIAGNOSIS — Z79899 Other long term (current) drug therapy: Secondary | ICD-10-CM

## 2024-04-17 DIAGNOSIS — Z833 Family history of diabetes mellitus: Secondary | ICD-10-CM

## 2024-04-17 DIAGNOSIS — Z9861 Coronary angioplasty status: Secondary | ICD-10-CM

## 2024-04-17 DIAGNOSIS — R471 Dysarthria and anarthria: Secondary | ICD-10-CM | POA: Diagnosis not present

## 2024-04-17 DIAGNOSIS — Z8249 Family history of ischemic heart disease and other diseases of the circulatory system: Secondary | ICD-10-CM

## 2024-04-17 DIAGNOSIS — Z7982 Long term (current) use of aspirin: Secondary | ICD-10-CM

## 2024-04-17 LAB — CBC WITH DIFFERENTIAL/PLATELET
Abs Immature Granulocytes: 0.03 10*3/uL (ref 0.00–0.07)
Basophils Absolute: 0.1 10*3/uL (ref 0.0–0.1)
Basophils Relative: 1 %
Eosinophils Absolute: 0.1 10*3/uL (ref 0.0–0.5)
Eosinophils Relative: 1 %
HCT: 41.7 % (ref 39.0–52.0)
Hemoglobin: 12.9 g/dL — ABNORMAL LOW (ref 13.0–17.0)
Immature Granulocytes: 0 %
Lymphocytes Relative: 25 %
Lymphs Abs: 2.1 10*3/uL (ref 0.7–4.0)
MCH: 32.6 pg (ref 26.0–34.0)
MCHC: 30.9 g/dL (ref 30.0–36.0)
MCV: 105.3 fL — ABNORMAL HIGH (ref 80.0–100.0)
Monocytes Absolute: 1 10*3/uL (ref 0.1–1.0)
Monocytes Relative: 12 %
Neutro Abs: 5 10*3/uL (ref 1.7–7.7)
Neutrophils Relative %: 61 %
Platelets: 121 10*3/uL — ABNORMAL LOW (ref 150–400)
RBC: 3.96 MIL/uL — ABNORMAL LOW (ref 4.22–5.81)
RDW: 15.3 % (ref 11.5–15.5)
WBC: 8.3 10*3/uL (ref 4.0–10.5)
nRBC: 0 % (ref 0.0–0.2)

## 2024-04-17 LAB — BASIC METABOLIC PANEL WITH GFR
Anion gap: 17 — ABNORMAL HIGH (ref 5–15)
BUN: 17 mg/dL (ref 8–23)
CO2: 27 mmol/L (ref 22–32)
Calcium: 8.4 mg/dL — ABNORMAL LOW (ref 8.9–10.3)
Chloride: 94 mmol/L — ABNORMAL LOW (ref 98–111)
Creatinine, Ser: 6.61 mg/dL — ABNORMAL HIGH (ref 0.61–1.24)
GFR, Estimated: 8 mL/min — ABNORMAL LOW (ref >60)
Glucose, Bld: 145 mg/dL — ABNORMAL HIGH (ref 70–99)
Potassium: 4.2 mmol/L (ref 3.5–5.1)
Sodium: 138 mmol/L (ref 135–145)

## 2024-04-17 LAB — CBG MONITORING, ED: Glucose-Capillary: 143 mg/dL — ABNORMAL HIGH (ref 70–99)

## 2024-04-17 MED ORDER — OXYCODONE HCL 5 MG PO TABS
5.0000 mg | ORAL_TABLET | ORAL | Status: DC | PRN
  Administered 2024-04-17 – 2024-04-20 (×6): 5 mg via ORAL
  Filled 2024-04-17 (×7): qty 1

## 2024-04-17 MED ORDER — HYDROMORPHONE HCL 1 MG/ML IJ SOLN
1.0000 mg | Freq: Once | INTRAMUSCULAR | Status: AC
  Administered 2024-04-17: 1 mg via INTRAVENOUS
  Filled 2024-04-17: qty 1

## 2024-04-17 NOTE — ED Notes (Signed)
 Phlebotomy asked to stick

## 2024-04-17 NOTE — ED Provider Notes (Signed)
 [place] EMERGENCY DEPARTMENT AT [facility] Provider Note   CSN: [id number] Arrival date & time: 04/17/24  1227     Patient presents with: Leg Pain   Jacob [name] is a 73 y.o. male.   Patient is a 73 year old male with a history of hypertension, CAD status post CABG, diabetes, end-stage renal disease on dialysis, significant PAD with a chronic wound on the right lower leg from arterial ulcer, COPD, prior subdural hemorrhage and anemia who is presenting today after dialysis due to ongoing to his right lower leg.  Patient has been following up with Dr. Rosan with wound care and they recommended he go to the emergency room yesterday after being seen at wound care because of ongoing pain in the leg.  Speaking with Dr. Rosan she reports that the wound has been gradually getting worse and is related to arterial causes.  Patient has had multiple appointments with Dr. Court but has canceled them because he has had to go to dialysis or had other engagements.  Dr. Rosan is concerned that if he does not get the consult to get interventional cardiology soon he is can end up losing his leg so wanted him to come to the emergency room for evaluation.  Denies any acute symptoms at this time and just ongoing weeping and pain in the right lower leg.  He has not had any infectious symptoms and denies any fever nausea or vomiting.  The history is provided by the patient.  Leg Pain      Prior to Admission medications   Medication Sig Start Date End Date Taking? Authorizing Provider  acetaminophen  (TYLENOL ) 650 MG CR tablet Take 1,300 mg by mouth 3 (three) times daily.    [provider]  albuterol  (PROVENTIL ) (2.5 MG/3ML) 0.083% nebulizer solution Take 3 mLs (2.5 mg total) by nebulization every 6 (six) hours as needed for wheezing or shortness of breath. 06/20/23 06/19/24  [name], Dinah C, NP  allopurinol  (ZYLOPRIM ) 100 MG tablet TAKE 1 TABLET BY MOUTH DAILY 11/01/23   Sherlynn Madden, MD  atorvastatin  (LIPITOR ) 10 MG tablet TAKE 1 TABLET(10 MG) BY MOUTH DAILY. 11/23/23   Wyn Jackee VEAR Mickey., NP  B-D UF III MINI PEN NEEDLES 31G X 5 MM MISC Inject 1 Box into the skin 4 (four) times daily. 04/15/24   de [place], Raymond J, MD  BIKTARVY  [phone] MG TABS tablet TAKE 1 TABLET BY MOUTH 1 TIME A DAY 01/04/24   Efrain Lamar ORN, MD  Calcium  Acetate 667 MG TABS Take 667-2,001 mg by mouth See admin instructions. Take 2001 mg by mouth three times a day with meals and 667 mg with each snack 01/20/21   [provider]  carvedilol  (COREG ) 3.125 MG tablet TAKE 1 TABLET(3.125 MG) BY MOUTH TWICE DAILY WITH A MEAL 11/28/23   Dick, Ernest H Jr., NP  diclofenac  Sodium (VOLTAREN ) 1 % GEL Apply 2 grams topically to the affected area three times daily as needed for pain. 12/19/22   Cleotilde Garnette HERO, MD  gabapentin  (NEURONTIN ) 300 MG capsule Take 1 capsule (300 mg total) by mouth in the morning and at bedtime. Patient is taking 1 tablet in the early evening after dialysis and 1 tablet at bedtime 03/01/24   de [place], Quintin PARAS, MD  HUMALOG  KWIKPEN 100 UNIT/ML KwikPen Inject 15 Units into the skin 3 (three) times daily. 01/02/24   Sherlynn Madden, MD  insulin  degludec (TRESIBA ) 100 UNIT/ML FlexTouch Pen Inject 15 Units into the skin daily.  3 01/02/24   Sherlynn Madden, MD  isosorbide  mononitrate (IMDUR ) 30 MG 24 hr tablet Take 1 tablet (30 mg total) by mouth daily. 04/12/24   de [place], Raymond J, MD  latanoprost  (XALATAN ) 0.005 % ophthalmic solution Place 1 drop into both eyes at bedtime.    [provider]  leptospermum manuka honey (MEDIHONEY) PSTE paste Apply 1 Application topically daily. Cleanse R lower leg wound with Vashe wound cleanser Soila [phone]), do not rinse and allow to air dry. Apply Medihoney to wound bed daily, cover with dry gauze and cover with silicone foam or ABD pad and Kerlix roll gauze to secure.Interchangeable with TheraHoney Apply thin layer (3 mm) to  wound. 01/25/24   Hongalgi, Anand D, MD  lidocaine -prilocaine  (EMLA ) cream Apply topically as directed. At dialysis 10/30/23   [provider]  Menthol -Camphor (TIGER BALM ARTHRITIS RUB) 11-11 % CREA Apply 1 Application topically as needed (pain).    [provider]  midodrine  (PROAMATINE ) 10 MG tablet Take 10 mg by mouth 3 (three) times a week. 03/21/23   [provider]  multivitamin (RENA-VIT) TABS tablet Take 1 tablet by mouth daily.    [provider]  neomycin-bacitracin-polymyxin [phone] OINT Apply 1 Application topically in the morning and at bedtime. 12/26/23   Sherlynn Madden, MD  nitroGLYCERIN  (NITROSTAT ) 0.3 MG SL tablet Place 1 tablet (0.3 mg total) under the tongue every 5 (five) minutes as needed for chest pain. 02/27/24   Nishan, Peter C, MD  octreotide  (SANDOSTATIN  LAR DEPOT) 10 MG injection Inject 10 mg into the muscle every 28 days. 03/21/24   McMichael, Nestor HERO, PA-C  oxyCODONE -acetaminophen  (PERCOCET) 5-325 MG tablet Take 1 tablet by mouth every 8 (eight) hours as needed for severe pain (pain score 7-10). 04/12/24   Urbano Albright, MD  polyethylene glycol (MIRALAX  / GLYCOLAX ) 17 g packet Take 17 g by mouth daily as needed for moderate constipation.    [provider]  PREVIDENT 5000 PLUS 1.1 % CREA dental cream Place 1 Application onto teeth at bedtime. 05/25/23   [provider]  simethicone  (MYLICON) 125 MG chewable tablet Chew 125 mg by mouth as needed for flatulence.    [provider]  timolol  (TIMOPTIC ) 0.5 % ophthalmic solution Place 1 drop into both eyes in the morning. 04/04/19   [provider]  umeclidinium-vilanterol (ANORO ELLIPTA ) 62.5-25 MCG/ACT AEPB Inhale 1 puff into the lungs daily. 06/07/23   Cleotilde Garnette HERO, MD    Allergies: Augmentin  [amoxicillin -pot clavulanate], Morphine  sulfate, Mucinex  fast-max, and Amphetamines    Review of Systems  Updated Vital Signs BP (!) 81/55 (BP  Location: Right Arm)   Pulse 74   Temp 97.8 F (36.6 C) (Oral)   Resp 20   Ht 6' 2 (1.88 m)   Wt 79.4 kg   SpO2 98%   BMI 22.47 kg/m   Physical Exam Vitals and nursing note reviewed.  Constitutional:      General: He is not in acute distress.    Appearance: He is well-developed.     Comments: Chronically ill-appearing  HENT:     Head: Normocephalic and atraumatic.   Eyes:     Conjunctiva/sclera: Conjunctivae normal.     Pupils: Pupils are equal, round, and reactive to light.    Cardiovascular:     Rate and Rhythm: Normal rate and regular rhythm.     Heart sounds: Murmur heard.  Pulmonary:     Effort: Pulmonary effort is normal. No respiratory distress.  Breath sounds: Normal breath sounds. No wheezing or rales.  Abdominal:     General: There is no distension.     Palpations: Abdomen is soft.     Tenderness: There is no abdominal tenderness. There is no guarding or rebound.   Musculoskeletal:        General: Tenderness present. Normal range of motion.     Cervical back: Normal range of motion and neck supple.     Comments: Large wound present on the right calf area without surrounding erythema or purulence.  Clear fluid drainage on the bandage without significant smell.  No palpable pulses but feet are warm bilaterally with less than 3-second capillary refill   Skin:    General: Skin is warm and dry.     Findings: No erythema or rash.   Neurological:     Mental Status: He is alert and oriented to person, place, and time.   Psychiatric:        Behavior: Behavior normal.     (all labs ordered are listed, but only abnormal results are displayed) Labs Reviewed  CBC WITH DIFFERENTIAL/PLATELET  BASIC METABOLIC PANEL WITH GFR    EKG: None  Radiology: No results found.   Procedures   Medications Ordered in the ED - No data to display                                  Medical Decision Making Amount and/or Complexity of Data Reviewed Labs:  ordered.   Pt with multiple medical problems and comorbidities and presenting today with a complaint that caries a high risk for morbidity and mortality.  Here today with the above complaints.  Dr. Rosan was hopeful that patient would be able to have a consult here in the emergency room so something can be scheduled for revascularization of his leg to help with wound healing.  There is no evidence of wound infection at this time nor concern from wound care.  Patient denies any infectious symptoms.  Patient just went through a full course of dialysis and upon arrival here blood pressure was low which is a chronic thing with him after dialysis when he was taking his midodrine .  However on repeat evaluation blood pressures improved without intervention to 105/68.      Final diagnoses:  None    ED Discharge Orders     None          Doretha Folks, MD 04/17/24 [phone]

## 2024-04-17 NOTE — Hospital Course (Addendum)
 73 y.o. M with ESRD on HD MWF, HIV, HTN, sCHF EF 40-45%, hx apical aneurysm, DM, CAD s/p CABG 2004 last PCI 2021, hep C, hx GIB due to AVMs, still smoking, PVD of RLE, chronic RLE wound who presented with nonhealing right medial calf wound.    Evidently, he had normal ABI in Feb, then developed a wound in March.  This progressed so [facility].  They treated it for a month, repeated ABI that now showed high grade stenosis in the RLE.  They referred him to Cardiology but he missed the appointment and so they recommended he come to the hospital because they were concerned if he does not get the consult by interventional cardiology soon he will end up losing his leg.  In the ER, patient was confused.  Cardiology evaluated the patient, recommended vascular surgery consult for angiogram tomorrow.

## 2024-04-17 NOTE — H&P (Signed)
 History and Physical    Patient: Jacob [name] FMW:[id number] DOB: [birth year] DOA: 04/17/2024 DOS: the patient was seen and examined on 04/17/2024 PCP: de Peru, Raymond J, MD  Patient coming from: Home  Chief Complaint:  Chief Complaint  Patient presents with   Leg Pain       HPI:  73 y.o. M with ESRD on HD MWF, HIV, HTN, sCHF EF 40-45%, hx apical aneurysm, DM, CAD s/p CABG 2004 last PCI 2021, hep C, hx GIB due to AVMs, still smoking, PVD of RLE, chronic RLE wound who presented with nonhealing right medial calf wound.    Caveat that the patient is confused and an unreliable historian.    Evidently, he had normal ABI in Feb, then developed a wound in March.  This progressed so [facility]  They treated it for a month, repeated ABI that now showed high grade stenosis in the RLE.  They referred him to Cardiology but he missed the appointment and so they recommended he come to the hospital because they were concerned if he does not get the consult by interventional cardiology soon he will end up losing his leg.  In the ER, cardiology evaluated the patient, recommended vascular surgery consult for angiogram tomorrow.   He was confused and rambling.  BUN normal.  WBC normal.      Review of Systems  Constitutional:  Negative for chills and fever.  Gastrointestinal:  Negative for nausea and vomiting.  Neurological:  Positive for weakness.  Complains of leg pain, unable to localize.  Denies confusion although clearly is confused and remainder of ROS is unreliable.    Past Medical History:  Diagnosis Date   Acute respiratory failure (HCC) 03/01/2018   Anemia    Arthritis    all over; mostly knees and back (02/28/2018)   Chronic combined systolic and diastolic CHF (congestive heart failure) (HCC)    Chronic lower back pain    stenosis   Community acquired pneumonia 09/06/2013   COPD (chronic obstructive pulmonary disease) (HCC)    Coronary  atherosclerosis of native coronary artery    a. 02/2003 s/p CABG x 2 (VG->RI, VG->RPDA; b. 11/2019 PCI: LM nl, LAD 90d, D3 50, RI 100, LCX 100p, OM3 100 - fills via L->L collats from D2/dLAD, RCA 100p, VG->RPDA ok, VG->RI 95 (3.5x48 Synergy XD DES).   Drug abuse (HCC)    hx; tested for cocaine as recently as 2/08. says he is not using drugs now - avoided defib. for this reason    ESRD (end stage renal disease) (HCC)    [facility] M-W-F- [facility]   Fall at home 10/2020   GERD (gastroesophageal reflux disease)    takes OTC meds as needed   GI bleeding    a. 11/2019 EGD: angiodysplastic lesions w/ bleeding s/p argon plasma/clipping/epi inj. Multiple admissions for the same.   Glaucoma    uses eye drops daily   Hepatitis B 1968   tx'd w/isolation; caught it from toilet stools in gym   History of blood transfusion 03/01/2019   History of colon polyps    benign   History of gout    takes Allopurinol  daily as well as Colchicine -if needed (02/28/2018)   History of kidney stones    HTN (hypertension)    takes Coreg ,Imdur .and Apresoline  daily   Human immunodeficiency virus (HIV) disease (HCC) dx'd 1995   on Biktarvy  as of 12/2020.     Hyperlipidemia  Ischemic cardiomyopathy    a. 01/2019 Echo: EF 40-45%, diffuse HK, mild basal septal hypertrophy. Diast dysfxn. Nl RV size/fxn. Sev dil LA. Triv MR/TR/PR.   Muscle spasm    takes Zanaflex  as needed   Myocardial infarction [facility]) ~ 2004/2005   Nocturia    Peripheral neuropathy    takes gabapentin  daily   Pneumonia    at least twice (02/28/2018)   SDH (subdural hematoma) (HCC)    Syphilis, unspecified    Type II diabetes mellitus (HCC) 2004   Lantus  daily.Average fasting blood sugar 125-199   Wears glasses    Wears partial dentures    Past Surgical History:  Procedure Laterality Date   A/V FISTULAGRAM N/A 12/07/2023   Procedure: A/V Fistulagram;  Surgeon: Norine Manuelita LABOR, MD;  [facility]: [facility] INVASIVE CV LAB;  Service: Cardiovascular;  Laterality:  N/A;   AV FISTULA PLACEMENT Left 08/02/2018   Procedure: ARTERIOVENOUS (AV) FISTULA CREATION  left arm radiocephlic;  Surgeon: Gretta Lonni PARAS, MD;  Location: [facility] OR;  Service: Vascular;  Laterality: Left;   AV FISTULA PLACEMENT Left 08/01/2019   Procedure: LEFT BRACHIOCEPHALIC ARTERIOVENOUS (AV) FISTULA CREATION;  Surgeon: Oris Krystal FALCON, MD;  Location: [facility] OR;  Service: Vascular;  Laterality: Left;   AV FISTULA PLACEMENT Right 04/12/2022   Procedure: RIGHT UPPER EXTREMITY ARTERIOVENOUS (AV)  GRAFT INSERTION USING GORE STRETCH 4-7 MM;  Surgeon: Eliza Lonni RAMAN, MD;  Location: [facility] OR;  Service: Vascular;  Laterality: Right;   BASCILIC VEIN TRANSPOSITION Left 10/03/2019   Procedure: BASILIC VEIN TRANSPOSITION LEFT SECOND STAGE;  Surgeon: Oris Krystal FALCON, MD;  Location: [facility] OR;  Service: Vascular;  Laterality: Left;   BIOPSY  01/25/2021   Procedure: BIOPSY;  Surgeon: Legrand Victory LITTIE DOUGLAS, MD;  Location: [facility] ENDOSCOPY;  Service: Gastroenterology;;   CARDIAC CATHETERIZATION  10/2002; 12/19/2004   thelbert 03/08/2011   COLONOSCOPY  2013   Dr.John Abran    CORONARY ARTERY BYPASS GRAFT  02/24/2003   CABG X2/notes 03/08/2011   CORONARY STENT INTERVENTION N/A 12/19/2019   Procedure: CORONARY STENT INTERVENTION;  Surgeon: Dann Candyce RAMAN, MD;  [facility];  Service: Cardiovascular;  Laterality: N/A;   CORONARY ULTRASOUND/IVUS N/A 12/19/2019   Procedure: Intravascular Ultrasound/IVUS;  Surgeon: Dann Candyce RAMAN, MD;  Location: [facility] INVASIVE CV LAB;  Service: Cardiovascular;  Laterality: N/A;   ENTEROSCOPY N/A 01/25/2021   Procedure: ENTEROSCOPY;  Surgeon: Legrand Victory LITTIE DOUGLAS, MD;  Location: [facility] ENDOSCOPY;  Service: Gastroenterology;  Laterality: N/A;   ENTEROSCOPY N/A 02/13/2021   Procedure: ENTEROSCOPY;  Surgeon: Charlanne Groom, MD;  Location: [facility] ENDOSCOPY;  Service: Endoscopy;  Laterality: N/A;   ENTEROSCOPY N/A 05/07/2021   Procedure: ENTEROSCOPY;  Surgeon: Leigh Elspeth SQUIBB, MD;   Location: [facility] ENDOSCOPY;  Service: Gastroenterology;  Laterality: N/A;   ESOPHAGOGASTRODUODENOSCOPY (EGD) WITH PROPOFOL  N/A 02/08/2019   Procedure: ESOPHAGOGASTRODUODENOSCOPY (EGD) WITH PROPOFOL ;  Surgeon: Teressa Toribio SQUIBB, MD;  Location: [facility] ENDOSCOPY;  Service: Gastroenterology;  Laterality: N/A;   ESOPHAGOGASTRODUODENOSCOPY (EGD) WITH PROPOFOL  N/A 12/22/2019   Procedure: ESOPHAGOGASTRODUODENOSCOPY (EGD) WITH PROPOFOL ;  Surgeon: [name] Sandor GAILS, DO;  [facility] ENDOSCOPY;  Service: Gastroenterology;  Laterality: N/A;   ESOPHAGOGASTRODUODENOSCOPY (EGD) WITH PROPOFOL  N/A 10/19/2020   Procedure: ESOPHAGOGASTRODUODENOSCOPY (EGD) WITH PROPOFOL ;  Surgeon: Charlanne Groom, MD;  Location: [facility] ENDOSCOPY;  Service: Endoscopy;  Laterality: N/A;   ESOPHAGOGASTRODUODENOSCOPY (EGD) WITH PROPOFOL  N/A 12/22/2020   Procedure: ESOPHAGOGASTRODUODENOSCOPY (EGD) WITH PROPOFOL ;  Surgeon: Avram Lupita BRAVO, MD;  Location: [facility] ENDOSCOPY;  Service: Endoscopy;  Laterality: N/A;  ESOPHAGOGASTRODUODENOSCOPY (EGD) WITH PROPOFOL  N/A 01/09/2021   Procedure: ESOPHAGOGASTRODUODENOSCOPY (EGD) WITH PROPOFOL ;  Surgeon: Abran Norleen SAILOR, MD;  Location: [facility] ENDOSCOPY;  Service: Endoscopy;  Laterality: N/A;   HEMOSTASIS CLIP PLACEMENT  12/22/2019   Procedure: HEMOSTASIS CLIP PLACEMENT;  Surgeon: [name] Sandor GAILS, DO;  [facility] ENDOSCOPY;  Service: Gastroenterology;;   HEMOSTASIS CLIP PLACEMENT  12/22/2020   Procedure: HEMOSTASIS CLIP PLACEMENT;  Surgeon: Avram Lupita BRAVO, MD;  Location: [facility] ENDOSCOPY;  Service: Endoscopy;;   HEMOSTASIS CONTROL  12/22/2020   Procedure: HEMOSTASIS CONTROL/hemospray;  Surgeon: Avram Lupita BRAVO, MD;  Location: [facility] ENDOSCOPY;  Service: Endoscopy;;   HOT HEMOSTASIS N/A 02/08/2019   Procedure: HOT HEMOSTASIS (ARGON PLASMA COAGULATION/BICAP);  Surgeon: Teressa Toribio SQUIBB, MD;  Location: [facility] ENDOSCOPY;  Service: Gastroenterology;  Laterality: N/A;   HOT HEMOSTASIS N/A 12/22/2019   Procedure: HOT HEMOSTASIS (ARGON PLASMA  COAGULATION/BICAP);  Surgeon: [name] Sandor GAILS, DO;  Location: [facility] ENDOSCOPY;  Service: Gastroenterology;  Laterality: N/A;   HOT HEMOSTASIS N/A 10/19/2020   Procedure: HOT HEMOSTASIS (ARGON PLASMA COAGULATION/BICAP);  Surgeon: Charlanne Groom, MD;  Location: [facility] ENDOSCOPY;  Service: Endoscopy;  Laterality: N/A;   HOT HEMOSTASIS N/A 12/22/2020   Procedure: HOT HEMOSTASIS (ARGON PLASMA COAGULATION/BICAP);  Surgeon: Avram Lupita BRAVO, MD;  Location: [facility] ENDOSCOPY;  Service: Endoscopy;  Laterality: N/A;   HOT HEMOSTASIS N/A 01/09/2021   Procedure: HOT HEMOSTASIS (ARGON PLASMA COAGULATION/BICAP);  Surgeon: Abran Norleen SAILOR, MD;  Location: [facility] ENDOSCOPY;  Service: Endoscopy;  Laterality: N/A;   HOT HEMOSTASIS N/A 01/25/2021   Procedure: HOT HEMOSTASIS (ARGON PLASMA COAGULATION/BICAP);  Surgeon: Legrand Victory LITTIE DOUGLAS, MD;  Location: [facility] ENDOSCOPY;  Service: Gastroenterology;  Laterality: N/A;   HOT HEMOSTASIS N/A 02/13/2021   Procedure: HOT HEMOSTASIS (ARGON PLASMA COAGULATION/BICAP);  Surgeon: Charlanne Groom, MD;  Location: [facility] ENDOSCOPY;  Service: Endoscopy;  Laterality: N/A;   HOT HEMOSTASIS N/A 05/07/2021   Procedure: HOT HEMOSTASIS (ARGON PLASMA COAGULATION/BICAP);  Surgeon: Leigh Elspeth SQUIBB, MD;  Location: [facility] ENDOSCOPY;  Service: Gastroenterology;  Laterality: N/A;   INSERTION OF DIALYSIS CATHETER N/A 02/08/2022   Procedure: INSERTION OF TUNNELED DIALYSIS CATHETER;  Surgeon: Eliza Lonni RAMAN, MD;  Location: [facility] OR;  Service: Vascular;  Laterality: N/A;   INTERTROCHANTERIC HIP FRACTURE SURGERY Left 11/2006   thelbert 03/08/2011   IR FLUORO GUIDE CV LINE LEFT  03/14/2022   IR FLUORO GUIDE CV LINE RIGHT  07/24/2019   IR FLUORO GUIDE CV LINE RIGHT  07/30/2019   IR US  GUIDE VASC ACCESS RIGHT  07/24/2019   IR US  GUIDE VASC ACCESS RIGHT  07/30/2019   LAPAROSCOPIC CHOLECYSTECTOMY  05/2006   LIGATION OF ARTERIOVENOUS  FISTULA Left 02/08/2022   Procedure: LIGATION OF LEFT ARTERIOVENOUS  FISTULA;  Surgeon: Eliza Lonni RAMAN, MD;  Location: [facility] OR;  Service: Vascular;  Laterality: Left;   LIGATION OF COMPETING BRANCHES OF ARTERIOVENOUS FISTULA Left 11/05/2018   Procedure: LIGATION OF COMPETING BRANCHES OF ARTERIOVENOUS FISTULA  LEFT  ARM;  Surgeon: Gretta Lonni PARAS, MD;  Location[facility] OR;  Service: Vascular;  Laterality: Left;   LUMBAR LAMINECTOMY/DECOMPRESSION MICRODISCECTOMY N/A 02/29/2016   Procedure: Left L4-5 Lateral Recess Decompression, Removal Extradural Intraspinal Facet Cyst;  Surgeon: Oneil JAYSON Herald, MD;  Location: [facility] OR;  Service: Orthopedics;  Laterality: N/A;   METATARSAL HEAD EXCISION Right 07/20/2022   Procedure: Irragation and debridement of right foot wound with extraction of all necrotic soft tissue and bone;  Surgeon: Malvin Marsa FALCON, DPM;  Location: [facility] OR;  Service: Podiatry;  Laterality: Right;  Right 4th Met head and base of proximal phalanx resection   MULTIPLE TOOTH EXTRACTIONS     ORIF MANDIBULAR FRACTURE Left 08/13/2004   ORIF of left body fracture mandible with KLS Martin 2.3-mm six hole/notes 03/08/2011   RIGHT/LEFT HEART CATH AND CORONARY/GRAFT ANGIOGRAPHY N/A 12/19/2019   Procedure: RIGHT/LEFT HEART CATH AND CORONARY/GRAFT ANGIOGRAPHY;  Surgeon: Dann Candyce RAMAN, MD;  Location: [facility] INVASIVE CV LAB;  Service: Cardiovascular;  Laterality: N/A;   SCLEROTHERAPY  12/22/2019   Procedure: SCLEROTHERAPY;  Surgeon: [name] Sandor GAILS, DO;  Location: [facility] ENDOSCOPY;  Service: Gastroenterology;;   MATIAS  02/13/2021   Procedure: MATIAS;  Surgeon: Charlanne Groom, MD;  Location: [facility] ENDOSCOPY;  Service: Endoscopy;;   Social History:  reports that he has been smoking cigarettes. He has a 21.5 pack-year smoking history. He has been exposed to tobacco smoke. He has never used smokeless tobacco. He reports that he does not currently use alcohol after a past usage of about 12.0 standard drinks of alcohol per week. He reports that he does not currently use drugs after having used  the following drugs: Cocaine.  Allergies  Allergen Reactions   Augmentin  [Amoxicillin -Pot Clavulanate] Diarrhea and Other (See Comments)    Severe diarrhea   Morphine  Sulfate Anaphylaxis    Can't mix with oxycodone     Mucinex  Fast-Max Other (See Comments)    Intense sweating    Amphetamines Other (See Comments)    Unknown reaction    Family History  Problem Relation Age of Onset   Heart failure Father    Hypertension Father    Diabetes Brother    Heart attack Brother    Alzheimer's disease Mother    Stroke Sister    Diabetes Sister    Alzheimer's disease Sister    Hypertension Brother    Diabetes Brother    Drug abuse Brother    Colon cancer Neg Hx     Prior to Admission medications   Medication Sig Start Date End Date Taking? Authorizing Provider  acetaminophen  (TYLENOL ) 650 MG CR tablet Take 1,300 mg by mouth 3 (three) times daily.    [provider]  albuterol  (PROVENTIL ) (2.5 MG/3ML) 0.083% nebulizer solution Take 3 mLs (2.5 mg total) by nebulization every 6 (six) hours as needed for wheezing or shortness of breath. 06/20/23 06/19/24  [name], Dinah C, NP  allopurinol  (ZYLOPRIM ) 100 MG tablet TAKE 1 TABLET BY MOUTH DAILY 11/01/23   Sherlynn Madden, MD  atorvastatin  (LIPITOR ) 10 MG tablet TAKE 1 TABLET(10 MG) BY MOUTH DAILY. 11/23/23   Wyn Jackee VEAR Mickey., NP  B-D UF III MINI PEN NEEDLES 31G X 5 MM MISC Inject 1 Box into the skin 4 (four) times daily. 04/15/24   de [place], Raymond J, MD  BIKTARVY  [phone] MG TABS tablet TAKE 1 TABLET BY MOUTH 1 TIME A DAY 01/04/24   Efrain Lamar ORN, MD  Calcium  Acetate 667 MG TABS Take 667-2,001 mg by mouth See admin instructions. Take 2001 mg by mouth three times a day with meals and 667 mg with each snack 01/20/21   [provider]  carvedilol  (COREG ) 3.125 MG tablet TAKE 1 TABLET(3.125 MG) BY MOUTH TWICE DAILY WITH A MEAL 11/28/23   Dick, Ernest H Jr., NP  diclofenac  Sodium (VOLTAREN ) 1 % GEL Apply 2 grams topically to the  affected area three times daily as needed for pain. 12/19/22   Cleotilde Garnette HERO, MD  gabapentin  (NEURONTIN ) 300 MG capsule Take 1 capsule (300 mg total) by mouth in the morning  and at bedtime. Patient is taking 1 tablet in the early evening after dialysis and 1 tablet at bedtime 03/01/24   de [place], Quintin PARAS, MD  HUMALOG  KWIKPEN 100 UNIT/ML KwikPen Inject 15 Units into the skin 3 (three) times daily. 01/02/24   Sherlynn Madden, MD  insulin  degludec (TRESIBA ) 100 UNIT/ML FlexTouch Pen Inject 15 Units into the skin daily. 3 01/02/24   Veludandi, Prashanthi, MD  isosorbide  mononitrate (IMDUR ) 30 MG 24 hr tablet Take 1 tablet (30 mg total) by mouth daily. 04/12/24   de [place], Raymond J, MD  latanoprost  (XALATAN ) 0.005 % ophthalmic solution Place 1 drop into both eyes at bedtime.    [provider]  leptospermum manuka honey (MEDIHONEY) PSTE paste Apply 1 Application topically daily. Cleanse R lower leg wound with Vashe wound cleanser Soila [phone]), do not rinse and allow to air dry. Apply Medihoney to wound bed daily, cover with dry gauze and cover with silicone foam or ABD pad and Kerlix roll gauze to secure.Interchangeable with TheraHoney Apply thin layer (3 mm) to wound. 01/25/24   Hongalgi, Anand D, MD  lidocaine -prilocaine  (EMLA ) cream Apply topically as directed. At dialysis 10/30/23   [provider]  Menthol -Camphor (TIGER BALM ARTHRITIS RUB) 11-11 % CREA Apply 1 Application topically as needed (pain).    [provider]  midodrine  (PROAMATINE ) 10 MG tablet Take 10 mg by mouth 3 (three) times a week. 03/21/23   [provider]  multivitamin (RENA-VIT) TABS tablet Take 1 tablet by mouth daily.    [provider]  neomycin-bacitracin-polymyxin [phone] OINT Apply 1 Application topically in the morning and at bedtime. 12/26/23   Sherlynn Madden, MD  nitroGLYCERIN  (NITROSTAT ) 0.3 MG SL tablet Place 1 tablet (0.3 mg total) under the tongue every 5 (five)  minutes as needed for chest pain. 02/27/24   Nishan, Peter C, MD  octreotide  (SANDOSTATIN  LAR DEPOT) 10 MG injection Inject 10 mg into the muscle every 28 days. 03/21/24   Mollie Nestor HERO, PA-C  oxyCODONE -acetaminophen  (PERCOCET) 5-325 MG tablet Take 1 tablet by mouth every 8 (eight) hours as needed for severe pain (pain score 7-10). 04/12/24   Urbano Albright, MD  polyethylene glycol (MIRALAX  / GLYCOLAX ) 17 g packet Take 17 g by mouth daily as needed for moderate constipation.    [provider]  PREVIDENT 5000 PLUS 1.1 % CREA dental cream Place 1 Application onto teeth at bedtime. 05/25/23   [provider]  simethicone  (MYLICON) 125 MG chewable tablet Chew 125 mg by mouth as needed for flatulence.    [provider]  timolol  (TIMOPTIC ) 0.5 % ophthalmic solution Place 1 drop into both eyes in the morning. 04/04/19   [provider]  umeclidinium-vilanterol (ANORO ELLIPTA ) 62.5-25 MCG/ACT AEPB Inhale 1 puff into the lungs daily. 06/07/23   Cleotilde Garnette HERO, MD    Physical Exam: Vitals:   04/17/24 1254 04/17/24 1257 04/17/24 1407 04/17/24 1408  BP:    (!) 116/59  Pulse:    72  Resp:    10  Temp:    97.8 F (36.6 C)  TempSrc:    Oral  SpO2: 98%  98% 100%  Weight:  79.4 kg    Height:  6' 2 (1.88 m)     Elderly adult male, lying in bed, sedated, sleepy, sluggish RRR, no murmurs, 1+ pitting in the right leg Respiratory rate normal, lungs clear without rales or wheezes, overall diminished Abdomen soft with nonfocal tenderness to palpation, no ascites or distention Attention  diminished, psychomotor slowing noted, judgment appears impaired, short-term memory seems impaired.  He has severe psychomotor slowing, able stated that he is in the hospital, but most other answers do not make sense (he has told numerous people that he has had surgery today), face symmetric, speech dysarthric due to sleepiness, severe generalized weakness There is a large punched-out  ischemic looking ulcer with rolled borders, underlying tissues pink, some scant granulation tissue, no drainage.       Data Reviewed: Discussed with EDP Basic metabolic panel shows creatinine elevated consistent with end-stage renal disease, BUN, potassium, bicarb normal CBC shows mild anemia, platelets 121    73 y.o. M with ESRD on HD MWF, HIV, HTN, sCHF EF 40-45%, hx apical aneurysm, DM, CAD s/p CABG 2004 last PCI 2021, hep C, hx GIB due to AVMs, still smoking, PVD of RLE, chronic RLE wound who presented with nonhealing right medial calf wound.     Assessment and Plan: Ischemic leg wound Peripheral vascular disease - Consult vascular surgery   Acute metabolic encephalopathy Based on his frequently missed appointments, description of his answers to doctors in the outpatient setting, presumably this patient has some incipient dementia.  However he presently lives independently, and at the moment in the ER he is very confused and unable to answers appropriately. -Check ammonia - Check TSH and B12 - Check LFTs   End-stage renal disease on dialysis MWF -Will consult nephrology if the patient is still present on Wednesday  HIV - Continue Biktarvy   Chronic systolic congestive heart failure EF 40-45%. -Hold carvedilol  until hemodynamics clear - Volume status per HD  Left apical aneurysm Significant for this is unclear to me.  It is interesting that this was noted, at the also the patient had an abrupt change in his RLE ABIs from Feb to May of this year. - Consult cardiology  Hypertension Coronary artery disease with chronic stable angina -Resume home Lipitor  which was stopped due to muscle aches - Hold carvedilol  and Imdur  until blood pressure trend clear  Diabetes Glucose controlled - Hold Tresiba  given likely n.p.o. in the morning - Start sliding scale corrections  History of hepatitis C -Check LFTs  History of GI bleed due to AVMs On octreotide  monthly as an  outpatient, hemoglobin appears stable, no reported bleeding  Smoking  COPD No evidence of flare - Continue Anoro       Advance Care Planning: FULL code confirmed with patient  Consults: Vascular surgery Cardiology   Family Communication: Called sister by phone, no answer  Severity of Illness: The appropriate patient status for this patient is INPATIENT. Inpatient status is judged to be reasonable and necessary in order to provide the required intensity of service to ensure the patient's safety. The patient's presenting symptoms, physical exam findings, and initial radiographic and laboratory data in the context of their chronic comorbidities is felt to place them at high risk for further clinical deterioration. Furthermore, it is not anticipated that the patient will be medically stable for discharge from the hospital within 2 midnights of admission.   * I certify that at the point of admission it is my clinical judgment that the patient will require inpatient hospital care spanning beyond 2 midnights from the point of admission due to high intensity of service, high risk for further deterioration and high frequency of surveillance required.*  Author: Lonni SHAUNNA Dalton, MD 04/17/2024 5:56 PM  For on call review [email or web address].

## 2024-04-17 NOTE — ED Triage Notes (Signed)
 Patient BIB [facility] from home for eval of chronic wound on leg. Patient was referred by wound clinic to come in yesterday but declined until after dialysis today. Patient completed full treatment, VSS, O2 89% on arrival, placed on 2L O2, up to 97%

## 2024-04-17 NOTE — ED Notes (Signed)
 CCMD called to add pt to monitor services.

## 2024-04-17 NOTE — ED Notes (Addendum)
 Per MD RN went in to assess if pt could safely sit up to eat. After being told it was too painful to sit up pt told RN  I am going to slap the shit out of that tech the next time I see her she did not give me the remote so I could call out. When RN pointed out that the call bell was next to the pt, pt stated She should have handed it to me, no I mean my at home remote she lost it. After RN told PT his property was likely in his belong bag  on the bed, pt went through the bag. After Finding his remote in the bag pt then stated  well she put my glasses in here without a case she should have given me a case. Pt told the hospital likely did not have spare cases. Pt remained agitated and hostile refusing further offers of help.

## 2024-04-17 NOTE — ED Notes (Addendum)
 Wet to dry dressing removed then replaced and wrapped per Doretha MD.

## 2024-04-17 NOTE — Consult Note (Addendum)
 Cardiology Consultation   [name] MRN: [id number]; DOB: 04-02-1951  Admit date: 04/17/2024 Date of Consult: 04/17/2024  PCP:  de [place], Raymond J, MD   [facility] Providers Cardiologist:  None      Patient Profile: Jacob [name] is a 73 y.o. male with a hx of CAD s/p CABG times 11/2002, ischemic cardiomyopathy, type 2 diabetes, hypertension, hyperlipidemia, HIV, hepatitis C, AV malformations and GI bleed, ESRD (Monday Wednesday Friday), tobacco use, prior cocaine use, RBBB, peripheral neuropathy, GERD who is being seen 04/17/2024 for the evaluation of chronic leg wound at the request of Dr. Doretha.  History of Present Illness: Jacob [name] is a 73 year old male with above medical history.  He was previously followed by Dr. Edith, he established care with Dr. Leontine in 2015.  He previously had CABG x 2 in 2004.  Cardiac MRI in 11/2013 showed EF 31% with multiple regional wall motion abnormalities consistent with multivessel CAD, full-thickness scar involving the lateral and inferolateral walls.  Patient was referred to EP for possible AICD but patient declined.    In 11/2019, patient complained of shortness of breath.  Underwent right/left heart catheterization on 12/19/2019 that showed patent SVG-RCA, 95% stenosis in SVG-ramus that was treated with DES, 90% stenosis in distal LAD that was too small for PCI.  He underwent echocardiogram 09/2020 that showed EF 40-45%, grade 2 diastolic dysfunction, normal RV systolic function, mild MR.  His most recent echocardiogram from 07/2022 showed EF 40-45% with true aneurysm of the LV apical/septal wall, no associated thrombus, grade 2 DD, mildly reduced RV systolic function, mild MR.  Patient was last in by cardiology in 10/2023.  At that time, patient reported shortness of breath that had been going on for 6 months.  Shortness of breath particular noticeable during conversation.  Patient also reported having swelling and pain in  his knee which had been partially managed with cortisone injections.  He also reported numbness and tingling in his toes.  He was scheduled for stress test which was not completed.  ABIs on 11/30/2023 were within normal range bilaterally.  Although ABIs were within normal limits, arterial Doppler waveforms at the ankle suggested some component of arterial occlusive disease. Later ultrasounds in 02/2024 showed 30-49% stenosis in the right superficial femoral artery, 30-49% stenosis in the left common femoal artery and superficial femoral artery, the heavily calcified bilateral lower extremity arterial tree.  On interview, patient rambles and changes his answers a few times. For example, I asked when he last saw Dr. Delford and he said he did not know who that was. When I asked who his primary cardiologist is, he told me Dr. Delford. I asked why he came to the ED today and he reported it was because of his leg. He reports that he has had a wound on that leg for 8 weeks. It is sometimes painful, and sometimes his feet hurt as well. He denies chest pain, shortness of breath. Denies any other concerns other than his leg. Denies fevers, chills, body aches. He understands that Dr. Court is going to do surgery. Thinks that the surgery is to pump blood to his heart.    Past Medical History:  Diagnosis Date   Acute respiratory failure (HCC) 03/01/2018   Anemia    Arthritis    all over; mostly knees and back (02/28/2018)   Chronic combined systolic and diastolic CHF (congestive heart failure) (HCC)    Chronic lower back pain    stenosis  Community acquired pneumonia 09/06/2013   COPD (chronic obstructive pulmonary disease) (HCC)    Coronary atherosclerosis of native coronary artery    a. 02/2003 s/p CABG x 2 (VG->RI, VG->RPDA; b. 11/2019 PCI: LM nl, LAD 90d, D3 50, RI 100, LCX 100p, OM3 100 - fills via L->L collats from D2/dLAD, RCA 100p, VG->RPDA ok, VG->RI 95 (3.5x48 Synergy XD DES).   Drug abuse (HCC)    hx;  tested for cocaine as recently as 2/08. says he is not using drugs now - avoided defib. for this reason    ESRD (end stage renal disease) (HCC)    Hemo M-W-F- [facility]   Fall at home 10/2020   GERD (gastroesophageal reflux disease)    takes OTC meds as needed   GI bleeding    a. 11/2019 EGD: angiodysplastic lesions w/ bleeding s/p argon plasma/clipping/epi inj. Multiple admissions for the same.   Glaucoma    uses eye drops daily   Hepatitis B 1968   tx'd w/isolation; caught it from toilet stools in gym   History of blood transfusion 03/01/2019   History of colon polyps    benign   History of gout    takes Allopurinol  daily as well as Colchicine -if needed (02/28/2018)   History of kidney stones    HTN (hypertension)    takes Coreg ,Imdur .and Apresoline  daily   Human immunodeficiency virus (HIV) disease (HCC) dx'd 1995   on Biktarvy  as of 12/2020.     Hyperlipidemia    Ischemic cardiomyopathy    a. 01/2019 Echo: EF 40-45%, diffuse HK, mild basal septal hypertrophy. Diast dysfxn. Nl RV size/fxn. Sev dil LA. Triv MR/TR/PR.   Muscle spasm    takes Zanaflex  as needed   Myocardial infarction (HCC) ~ 2004/2005   Nocturia    Peripheral neuropathy    takes gabapentin  daily   Pneumonia    at least twice (02/28/2018)   SDH (subdural hematoma) (HCC)    Syphilis, unspecified    Type II diabetes mellitus (HCC) 2004   Lantus  daily.Average fasting blood sugar 125-199   Wears glasses    Wears partial dentures     Past Surgical History:  Procedure Laterality Date   A/V FISTULAGRAM N/A 12/07/2023   Procedure: A/V Fistulagram;  Surgeon: Norine Manuelita LABOR, MD;  Location: [facility] INVASIVE CV LAB;  Service: Cardiovascular;  Laterality: N/A;   AV FISTULA PLACEMENT Left 08/02/2018   Procedure: ARTERIOVENOUS (AV) FISTULA CREATION  left arm radiocephlic;  Surgeon: Gretta Lonni PARAS, MD;  Location: [facility] OR;  Service: Vascular;  Laterality: Left;   AV FISTULA PLACEMENT Left 08/01/2019   Procedure: LEFT  BRACHIOCEPHALIC ARTERIOVENOUS (AV) FISTULA CREATION;  Surgeon: Oris Krystal FALCON, MD;  Location: [facility] OR;  Service: Vascular;  Laterality: Left;   AV FISTULA PLACEMENT Right 04/12/2022   Procedure: RIGHT UPPER EXTREMITY ARTERIOVENOUS (AV)  GRAFT INSERTION USING GORE STRETCH 4-7 MM;  Surgeon: Eliza Lonni RAMAN, MD;  Location[facility] OR;  Service: Vascular;  Laterality: Right;   BASCILIC VEIN TRANSPOSITION Left 10/03/2019   Procedure: BASILIC VEIN TRANSPOSITION LEFT SECOND STAGE;  Surgeon: Oris Krystal FALCON, MD;  Location: [facility] OR;  Service: Vascular;  Laterality: Left;   BIOPSY  01/25/2021   Procedure: BIOPSY;  Surgeon: Legrand Victory LITTIE DOUGLAS, MD;  Location: [facility] ENDOSCOPY;  Service: Gastroenterology;;   CARDIAC CATHETERIZATION  10/2002; 12/19/2004   thelbert 03/08/2011   COLONOSCOPY  2013   Dr.John Abran    CORONARY ARTERY BYPASS GRAFT  02/24/2003   CABG X2/notes 03/08/2011   CORONARY  STENT INTERVENTION N/A 12/19/2019   Procedure: CORONARY STENT INTERVENTION;  Surgeon: Dann Candyce RAMAN, MD;  Location: [facility] INVASIVE CV LAB;  Service: Cardiovascular;  Laterality: N/A;   CORONARY ULTRASOUND/IVUS N/A 12/19/2019   Procedure: Intravascular Ultrasound/IVUS;  Surgeon: Dann Candyce RAMAN, MD;  Location: [facility] INVASIVE CV LAB;  Service: Cardiovascular;  Laterality: N/A;   ENTEROSCOPY N/A 01/25/2021   Procedure: ENTEROSCOPY;  Surgeon: Legrand Victory LITTIE DOUGLAS, MD;  Location: [facility] ENDOSCOPY;  Service: Gastroenterology;  Laterality: N/A;   ENTEROSCOPY N/A 02/13/2021   Procedure: ENTEROSCOPY;  Surgeon: Charlanne Groom, MD;  Location: [facility] ENDOSCOPY;  Service: Endoscopy;  Laterality: N/A;   ENTEROSCOPY N/A 05/07/2021   Procedure: ENTEROSCOPY;  Surgeon: Leigh Elspeth SQUIBB, MD;  Location: [facility] ENDOSCOPY;  Service: Gastroenterology;  Laterality: N/A;   ESOPHAGOGASTRODUODENOSCOPY (EGD) WITH PROPOFOL  N/A 02/08/2019   Procedure: ESOPHAGOGASTRODUODENOSCOPY (EGD) WITH PROPOFOL ;  Surgeon: Teressa Toribio SQUIBB, MD;  Location: [facility] ENDOSCOPY;  Service:  Gastroenterology;  Laterality: N/A;   ESOPHAGOGASTRODUODENOSCOPY (EGD) WITH PROPOFOL  N/A 12/22/2019   Procedure: ESOPHAGOGASTRODUODENOSCOPY (EGD) WITH PROPOFOL ;  Surgeon: [name] Sandor GAILS, DO;  Location: [facility] ENDOSCOPY;  Service: Gastroenterology;  Laterality: N/A;   ESOPHAGOGASTRODUODENOSCOPY (EGD) WITH PROPOFOL  N/A 10/19/2020   Procedure: ESOPHAGOGASTRODUODENOSCOPY (EGD) WITH PROPOFOL ;  Surgeon: Charlanne Groom, MD;  Location: [facility] ENDOSCOPY;  Service: Endoscopy;  Laterality: N/A;   ESOPHAGOGASTRODUODENOSCOPY (EGD) WITH PROPOFOL  N/A 12/22/2020   Procedure: ESOPHAGOGASTRODUODENOSCOPY (EGD) WITH PROPOFOL ;  Surgeon: Avram Lupita BRAVO, MD;  Location: [facility] ENDOSCOPY;  Service: Endoscopy;  Laterality: N/A;   ESOPHAGOGASTRODUODENOSCOPY (EGD) WITH PROPOFOL  N/A 01/09/2021   Procedure: ESOPHAGOGASTRODUODENOSCOPY (EGD) WITH PROPOFOL ;  Surgeon: Abran Norleen SAILOR, MD;  Location: [facility] ENDOSCOPY;  Service: Endoscopy;  Laterality: N/A;   HEMOSTASIS CLIP PLACEMENT  12/22/2019   Procedure: HEMOSTASIS CLIP PLACEMENT;  Surgeon: [name] Sandor GAILS, DO;  Location: [facility] ENDOSCOPY;  Service: Gastroenterology;;   HEMOSTASIS CLIP PLACEMENT  12/22/2020   Procedure: HEMOSTASIS CLIP PLACEMENT;  Surgeon: Avram Lupita BRAVO, MD;  Location: [facility] ENDOSCOPY;  Service: Endoscopy;;   HEMOSTASIS CONTROL  12/22/2020   Procedure: HEMOSTASIS CONTROL/hemospray;  Surgeon: Avram Lupita BRAVO, MD;  Location: [facility] ENDOSCOPY;  Service: Endoscopy;;   HOT HEMOSTASIS N/A 02/08/2019   Procedure: HOT HEMOSTASIS (ARGON PLASMA COAGULATION/BICAP);  Surgeon: Teressa Toribio SQUIBB, MD;  Location: [facility] ENDOSCOPY;  Service: Gastroenterology;  Laterality: N/A;   HOT HEMOSTASIS N/A 12/22/2019   Procedure: HOT HEMOSTASIS (ARGON PLASMA COAGULATION/BICAP);  Surgeon: [name] Sandor GAILS, DO;  Location: [facility] ENDOSCOPY;  Service: Gastroenterology;  Laterality: N/A;   HOT HEMOSTASIS N/A 10/19/2020   Procedure: HOT HEMOSTASIS (ARGON PLASMA COAGULATION/BICAP);  Surgeon: Charlanne Groom, MD;  Location: [facility]  ENDOSCOPY;  Service: Endoscopy;  Laterality: N/A;   HOT HEMOSTASIS N/A 12/22/2020   Procedure: HOT HEMOSTASIS (ARGON PLASMA COAGULATION/BICAP);  Surgeon: Avram Lupita BRAVO, MD;  Location: [facility] ENDOSCOPY;  Service: Endoscopy;  Laterality: N/A;   HOT HEMOSTASIS N/A 01/09/2021   Procedure: HOT HEMOSTASIS (ARGON PLASMA COAGULATION/BICAP);  Surgeon: Abran Norleen SAILOR, MD;  Location: [facility] ENDOSCOPY;  Service: Endoscopy;  Laterality: N/A;   HOT HEMOSTASIS N/A 01/25/2021   Procedure: HOT HEMOSTASIS (ARGON PLASMA COAGULATION/BICAP);  Surgeon: Legrand Victory LITTIE DOUGLAS, MD;  Location: [facility] ENDOSCOPY;  Service: Gastroenterology;  Laterality: N/A;   HOT HEMOSTASIS N/A 02/13/2021   Procedure: HOT HEMOSTASIS (ARGON PLASMA COAGULATION/BICAP);  Surgeon: Charlanne Groom, MD;  Location: [facility] ENDOSCOPY;  Service: Endoscopy;  Laterality: N/A;   HOT HEMOSTASIS N/A 05/07/2021   Procedure: HOT HEMOSTASIS (ARGON PLASMA COAGULATION/BICAP);  Surgeon: Leigh Elspeth SQUIBB, MD;  Location: [facility] ENDOSCOPY;  Service: Gastroenterology;  Laterality: N/A;   INSERTION OF DIALYSIS CATHETER N/A 02/08/2022   Procedure: INSERTION OF TUNNELED DIALYSIS CATHETER;  Surgeon: Eliza Lonni RAMAN, MD;  Location: [facility] OR;  Service: Vascular;  Laterality: N/A;   INTERTROCHANTERIC HIP FRACTURE SURGERY Left 11/2006   thelbert 03/08/2011   IR FLUORO GUIDE CV LINE LEFT  03/14/2022   IR FLUORO GUIDE CV LINE RIGHT  07/24/2019   IR FLUORO GUIDE CV LINE RIGHT  07/30/2019   IR US  GUIDE VASC ACCESS RIGHT  07/24/2019   IR US  GUIDE VASC ACCESS RIGHT  07/30/2019   LAPAROSCOPIC CHOLECYSTECTOMY  05/2006   LIGATION OF ARTERIOVENOUS  FISTULA Left 02/08/2022   Procedure: LIGATION OF LEFT ARTERIOVENOUS  FISTULA;  Surgeon: Eliza Lonni RAMAN, MD;  Location: [facility] OR;  Service: Vascular;  Laterality: Left;   LIGATION OF COMPETING BRANCHES OF ARTERIOVENOUS FISTULA Left 11/05/2018   Procedure: LIGATION OF COMPETING BRANCHES OF ARTERIOVENOUS FISTULA  LEFT  ARM;  Surgeon: Gretta Lonni PARAS, MD;   Location: [facility] OR;  Service: Vascular;  Laterality: Left;   LUMBAR LAMINECTOMY/DECOMPRESSION MICRODISCECTOMY N/A 02/29/2016   Procedure: Left L4-5 Lateral Recess Decompression, Removal Extradural Intraspinal Facet Cyst;  Surgeon: Oneil JAYSON Herald, MD;  Location: [facility] OR;  Service: Orthopedics;  Laterality: N/A;   METATARSAL HEAD EXCISION Right 07/20/2022   Procedure: Irragation and debridement of right foot wound with extraction of all necrotic soft tissue and bone;  Surgeon: Malvin Marsa FALCON, DPM;  Location: [facility] OR;  Service: Podiatry;  Laterality: Right;  Right 4th Met head and base of proximal phalanx resection   MULTIPLE TOOTH EXTRACTIONS     ORIF MANDIBULAR FRACTURE Left 08/13/2004   ORIF of left body fracture mandible with KLS Martin 2.3-mm six hole/notes 03/08/2011   RIGHT/LEFT HEART CATH AND CORONARY/GRAFT ANGIOGRAPHY N/A 12/19/2019   Procedure: RIGHT/LEFT HEART CATH AND CORONARY/GRAFT ANGIOGRAPHY;  Surgeon: Dann Candyce RAMAN, MD;  Location: [facility] INVASIVE CV LAB;  Service: Cardiovascular;  Laterality: N/A;   SCLEROTHERAPY  12/22/2019   Procedure: SCLEROTHERAPY;  Surgeon: [name] Sandor GAILS, DO;  Location: [facility] ENDOSCOPY;  Service: Gastroenterology;;   MATIAS  02/13/2021   Procedure: MATIAS;  Surgeon: Charlanne Groom, MD;  Location: [facility] ENDOSCOPY;  Service: Endoscopy;;       Scheduled Meds:  Continuous Infusions:  PRN Meds:   Allergies:    Allergies  Allergen Reactions   Augmentin  [Amoxicillin -Pot Clavulanate] Diarrhea and Other (See Comments)    Severe diarrhea   Morphine  Sulfate Anaphylaxis    Can't mix with oxycodone     Mucinex  Fast-Max Other (See Comments)    Intense sweating    Amphetamines Other (See Comments)    Unknown reaction    Social History:   Social History   Socioeconomic History   Marital status: Single    Spouse name: Not on file   Number of children: 1   Years of education: Not on file   Highest education level: Not on file  Occupational  History   Occupation: retired  Tobacco Use   Smoking status: Every Day    Current packs/day: 0.50    Average packs/day: 0.5 packs/day for 43.0 years (21.5 ttl pk-yrs)    Types: Cigarettes    Passive exposure: Current   Smokeless tobacco: Never  Vaping Use   Vaping status: Never Used  Substance and Sexual Activity   Alcohol use: Not Currently    Alcohol/week: 12.0 standard drinks of alcohol    Types: 12 Standard drinks or equivalent per week    Comment: occassional   Drug  use: Not Currently    Types: Cocaine    Comment: hx of crack/cocaine 15yrs ago 10/01/2019- none   Sexual activity: Yes    Partners: Male    Comment: Declined condoms  Other Topics Concern   Not on file  Social History Narrative   Retired.       As of 05/2015:   Diet: No salt    Caffeine    Married: Single    House: Condo, 2 stories, 1 person (self)    Pets: No    Current/Past profession: N/A   Exercise: walks daily    Living Will: Yes    DNR   POA/HPOA: No       Social Drivers of Corporate investment banker Strain: Low Risk  (10/26/2020)   Overall Financial Resource Strain (CARDIA)    Difficulty of Paying Living Expenses: Not hard at all  Food Insecurity: No Food Insecurity (02/27/2024)   Hunger Vital Sign    Worried About Running Out of Food in the Last Year: Never true    Ran Out of Food in the Last Year: Never true  Transportation Needs: No Transportation Needs (02/27/2024)   PRAPARE - Administrator, Civil Service (Medical): No    Lack of Transportation (Non-Medical): No  Physical Activity: Insufficiently Active (06/26/2018)   Exercise Vital Sign    Days of Exercise per Week: 7 days    Minutes of Exercise per Session: 20 min  Stress: No Stress Concern Present (02/27/2024)   [employer] of Occupational Health - Occupational Stress Questionnaire    Feeling of Stress : Not at all  Social Connections: Somewhat Isolated (06/26/2018)   Social Connection and Isolation Panel    Frequency of  Communication with Friends and Family: Three times a week    Frequency of Social Gatherings with Friends and Family: Three times a week    Attends Religious Services: More than 4 times per year    Active Member of Clubs or Organizations: No    Attends Banker Meetings: Never    Marital Status: Never married  Intimate Partner Violence: Not At Risk (02/27/2024)   Humiliation, Afraid, Rape, and Kick questionnaire    Fear of Current or Ex-Partner: No    Emotionally Abused: No    Physically Abused: No    Sexually Abused: No    Family History:    Family History  Problem Relation Age of Onset   Heart failure Father    Hypertension Father    Diabetes Brother    Heart attack Brother    Alzheimer's disease Mother    Stroke Sister    Diabetes Sister    Alzheimer's disease Sister    Hypertension Brother    Diabetes Brother    Drug abuse Brother    Colon cancer Neg Hx      ROS:  Please see the history of present illness.  All other ROS reviewed and negative.     Physical Exam/Data: Vitals:   04/17/24 1254 04/17/24 1257 04/17/24 1407 04/17/24 1408  BP:    (!) 116/59  Pulse:    72  Resp:    10  Temp:    97.8 F (36.6 C)  TempSrc:    Oral  SpO2: 98%  98% 100%  Weight:  79.4 kg    Height:  6' 2 (1.88 m)     No intake or output data in the 24 hours ending 04/17/24 1520    04/17/2024   12:57 PM  03/05/2024    9:41 AM 02/22/2024    9:30 AM  Last 3 Weights  Weight (lbs) 175 lb 197 lb 9.6 oz 196 lb 12.8 oz  Weight (kg) 79.379 kg 89.631 kg 89.268 kg     Body mass index is 22.47 kg/m.  Physical exam limited- full physical exam per [facility]  General:  Thin, elderly male. Sitting upright in the bed in no acute distress HEENT: normal Neck: no JVD Lungs:  breathing unlabored  Abd: nondistended  Ext: no edema in BLE  Musculoskeletal:  Bandages in place around right shin/ankle. Unable to palpate pulses in R foot, but skin is warm and well perfused.  Skin: warm and dry  Neuro:   CNs 2-12 intact, no focal abnormalities noted Psych:  Normal affect   EKG:  The EKG was personally reviewed and demonstrates:  No EKG this visit  Telemetry:  Telemetry was personally reviewed and demonstrates:  NSR   Relevant CV Studies: Cardiac Studies & Procedures   ______________________________________________________________________________________________     ECHOCARDIOGRAM  ECHOCARDIOGRAM COMPLETE 08/23/2022  Narrative ECHOCARDIOGRAM REPORT    Patient Name:   Jacob [name] Date of Exam: 08/23/2022 Medical Rec #:  [id number]       Height:       69.0 in Accession #:    [id number]      Weight:       190.0 lb Date of Birth:  04/14/1951        BSA:          2.021 m Patient Age:    71 years        BP:           110/60 mmHg Patient Gender: M               HR:           81 bpm. Exam Location:  [address]  Procedure: 2D Echo, Cardiac Doppler, Color Doppler and Intracardiac Opacification Agent  Indications:    I25.10 CAD  History:        Patient has prior history of Echocardiogram examinations, most recent 10/18/2020. CHF, CAD and Previous Myocardial Infarction, Prior CABG, COPD, Mitral Valve Disease; Risk Factors:Hypertension, Diabetes, Dyslipidemia, Family History of Coronary Artery Disease and Current Smoker. Ischemic Cardiomyopathy (prior EF 40-45%), History of Substance Abuse (ETOH and Cocaine), End Stage Renal Disease, HIV infection, Hepatitis B, Acute Renal Failure.  Sonographer:    Heather Hawks RDCS Referring Phys: ORREN SAILOR CONTE  IMPRESSIONS   1. True aneurysm of the LV apical/septal wall, neck measures 2.05 cm (seen best in image 142) - no associated thrombus. Left ventricular ejection fraction, by estimation, is 40 to 45%. The left ventricle has mildly decreased function. The left ventricle demonstrates regional wall motion abnormalities (see scoring diagram/findings for description). The left ventricular internal cavity size was mildly dilated. Left  ventricular diastolic parameters are consistent with Grade II diastolic dysfunction (pseudonormalization). Elevated left ventricular end-diastolic pressure. There is severe akinesis of the left ventricular, basal-mid inferior wall, inferoseptal wall, inferolateral wall and lateral wall. 2. Right ventricular systolic function is mildly reduced. The right ventricular size is normal. Tricuspid regurgitation signal is inadequate for assessing PA pressure. 3. Left atrial size was moderately dilated. 4. The mitral valve is abnormal. Mild mitral valve regurgitation. 5. The aortic valve is tricuspid. Aortic valve regurgitation is not visualized. 6. The inferior vena cava is dilated in size with <50% respiratory variability, suggesting right atrial pressure of 15 mmHg.  Comparison(s): Changes from  prior study are noted. 10/18/2020: LVEF 40-45%,.  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 Left ventricular ejection fraction, by estimation, is 40 to 45%. The left ventricle has mildly decreased function. The left ventricle demonstrates regional wall motion abnormalities. Severe akinesis of the left ventricular, basal-mid inferior wall, inferoseptal wall, inferolateral wall and lateral wall. Definity  contrast agent was given IV to delineate the left ventricular endocardial borders. The left ventricular internal cavity size was mildly dilated. There is no left ventricular hypertrophy. Left ventricular diastolic parameters are consistent with Grade II diastolic dysfunction (pseudonormalization). Elevated left ventricular end-diastolic pressure.  Right Ventricle: The right ventricular size is normal. No increase in right ventricular wall thickness. Right ventricular systolic function is mildly reduced. Tricuspid regurgitation signal is inadequate for assessing PA pressure.  Left Atrium: Left atrial size was  moderately dilated.  Right Atrium: Right atrial size was normal in size.  Pericardium: There is no evidence of pericardial effusion.  Mitral Valve: The mitral valve is abnormal. Mild to moderate mitral annular calcification. Mild mitral valve regurgitation.  Tricuspid Valve: The tricuspid valve is grossly normal. Tricuspid valve regurgitation is trivial.  Aortic Valve: The aortic valve is tricuspid. Aortic valve regurgitation is not visualized.  Pulmonic Valve: The pulmonic valve was grossly normal. Pulmonic valve regurgitation is trivial.  Aorta: The aortic root and ascending aorta are structurally normal, with no evidence of dilitation.  Venous: The inferior vena cava is dilated in size with less than 50% respiratory variability, suggesting right atrial pressure of 15 mmHg.  IAS/Shunts: No atrial level shunt detected by color flow Doppler.   LEFT VENTRICLE PLAX 2D LVIDd:         6.10 cm   Diastology LVIDs:         3.90 cm   LV e' medial:    4.35 cm/s LV PW:         1.00 cm   LV E/e' medial:  28.7 LV IVS:        0.90 cm   LV e' lateral:   6.31 cm/s LVOT diam:     2.50 cm   LV E/e' lateral: 19.8 LV SV:         96 LV SV Index:   47 LVOT Area:     4.91 cm   RIGHT VENTRICLE RV Basal diam:  4.60 cm RV Mid diam:    3.50 cm RV S prime:     8.27 cm/s TAPSE (M-mode): 1.5 cm  LEFT ATRIUM              Index        RIGHT ATRIUM           Index LA diam:        5.30 cm  2.62 cm/m   RA Area:     25.80 cm LA Vol (A2C):   115.0 ml 56.89 ml/m  RA Volume:   88.20 ml  43.63 ml/m LA Vol (A4C):   83.4 ml  41.26 ml/m LA Biplane Vol: 98.5 ml  48.73 ml/m AORTIC VALVE LVOT Vmax:   89.50 cm/s LVOT Vmean:  63.200 cm/s LVOT VTI:    0.196 m  AORTA Ao Root diam: 3.00 cm Ao Asc diam:  2.90 cm  MITRAL VALVE MV Area (PHT): cm          SHUNTS MV Decel Time: 147 msec     Systemic VTI:  0.20 m  MR Peak grad: 58.4 mmHg     Systemic Diam: 2.50 cm MR Mean grad: 37.0 mmHg MR Vmax:       382.00 cm/s MR Vmean:     283.0 cm/s MV E velocity: 125.00 cm/s MV A velocity: 74.95 cm/s MV E/A ratio:  1.67  Vinie Maxcy MD Electronically signed by Vinie Maxcy MD Signature Date/Time: 08/23/2022/3:09:18 PM    Final          ______________________________________________________________________________________________      Laboratory Data: High Sensitivity Troponin:  No results for input(s): TROPONINIHS in the last 720 hours.   ChemistryNo results for input(s): NA, K, CL, CO2, GLUCOSE, BUN, CREATININE, CALCIUM , MG, GFRNONAA, GFRAA, ANIONGAP in the last 168 hours.  No results for input(s): PROT, ALBUMIN , AST, ALT, ALKPHOS, BILITOT in the last 168 hours. Lipids No results for input(s): CHOL, TRIG, HDL, LABVLDL, LDLCALC, CHOLHDL in the last 168 hours.  HematologyNo results for input(s): WBC, RBC, HGB, HCT, MCV, MCH, MCHC, RDW, PLT in the last 168 hours. Thyroid  No results for input(s): TSH, FREET4 in the last 168 hours.  BNPNo results for input(s): BNP, PROBNP in the last 168 hours.  DDimer No results for input(s): DDIMER in the last 168 hours.  Radiology/Studies:  No results found.   Assessment and Plan:  R Lower Extremity Wound  - Patient has had a wound on his R medial calf- tells me it has been ongoing for 8 weeks and has not healed. He is followed by wound care. Wound care has encouraged him to see Dr. Court and they suspect he will need peripheral angiogram  - Patient has had a difficult time scheduling an appointment with Dr. Court that does not interfere with his dialysis schedule. Dr. Rosan with wound care was concerned that if he did not have peripheral angiogram completed soon, he will lose his leg. He was sent to the ED for evaluation  - In the ED, patient denies any symptoms or concerns aside from his leg wound. Specifically denies fevers, chills, body aches - Initially considered  discharging him with outpatient follow up and outpatient peripheral angiogram. However, concerned that patient would miss appointments and delay care. Dr. Court discussed with Vascular Surgery Dr. Gretta who would be willing to see tomorrow - Recommend admitting to internal medicine service with vascular to see in AM   CAD  - patient denies chest pain  - Continue imdur  30 mg daily, carvedilol  3.125 mg BID  - Lipitor  stopped by nephrologist due to muscle cramps   Chronic Systolic Heart Failure  - EF 40-45% in 07/2022  - Euvolemic on exam. Volume managed by HD  - Continue carvedilol , imdur    Otherwise per primary  - ESRD on HD  - Type 2 DM  - History of GI bleed - HIV     For questions or updates, please contact [facility] Please consult [email or web address] for contact info under    Signed, Rollo FABIENE Louder, PA-C  04/17/2024 3:20 PM  Agree with note by Rollo Louder PA-C   We are asked to see this 73 year old African-American male patient of Dr. Delford with a history of diabetes, hypertension, hyperlipidemia and, HIV, hepatitis tobacco abuse.  He has had CABG in the past.  He does have chronic renal sufficiency on dialysis.  He apparently had a right calf wound and has been seen by Harlene Rosan, DO with wound care center.  His Doppler showed noncompressible ABIs with tibial disease on the right most likely occluded.  He  is stable from a cardiac point of view.  I believe the most expeditious treatment plan would be angiography tomorrow and potential intervention for critical of ischemia.  I have discussed this with Dr. Gretta who is agreed to add him on as fifth case.  Dorn DOROTHA Lesches, M.D., FACP, [facility], FAHA, [facility]  [address] [place], [name]  [address]  [phone] 04/17/2024 5:05 PM

## 2024-04-17 NOTE — ED Notes (Signed)
 Pt became aggressive. Pt stated to get the hell out of my room.

## 2024-04-17 NOTE — ED Notes (Signed)
 While being transported to room 37 from room 1 pt called 911 multiple times.

## 2024-04-18 ENCOUNTER — Encounter (HOSPITAL_COMMUNITY)
Admission: EM | Disposition: A | Discharge status: Hospice | Payer: Self-pay | Source: Home / Self Care | Attending: Family Medicine

## 2024-04-18 ENCOUNTER — Inpatient Hospital Stay (HOSPITAL_COMMUNITY)

## 2024-04-18 DIAGNOSIS — S81801A Unspecified open wound, right lower leg, initial encounter: Secondary | ICD-10-CM | POA: Diagnosis not present

## 2024-04-18 DIAGNOSIS — I70221 Atherosclerosis of native arteries of extremities with rest pain, right leg: Secondary | ICD-10-CM

## 2024-04-18 DIAGNOSIS — Z8619 Personal history of other infectious and parasitic diseases: Secondary | ICD-10-CM | POA: Insufficient documentation

## 2024-04-18 DIAGNOSIS — G9341 Metabolic encephalopathy: Secondary | ICD-10-CM | POA: Insufficient documentation

## 2024-04-18 DIAGNOSIS — I253 Aneurysm of heart: Secondary | ICD-10-CM

## 2024-04-18 DIAGNOSIS — Z8774 Personal history of (corrected) congenital malformations of heart and circulatory system: Secondary | ICD-10-CM

## 2024-04-18 HISTORY — PX: LOWER EXTREMITY ANGIOGRAPHY: CATH118251

## 2024-04-18 LAB — CBC
HCT: 36.3 % — ABNORMAL LOW (ref 39.0–52.0)
Hemoglobin: 11.6 g/dL — ABNORMAL LOW (ref 13.0–17.0)
MCH: 33 pg (ref 26.0–34.0)
MCHC: 32 g/dL (ref 30.0–36.0)
MCV: 103.1 fL — ABNORMAL HIGH (ref 80.0–100.0)
Platelets: 141 10*3/uL — ABNORMAL LOW (ref 150–400)
RBC: 3.52 MIL/uL — ABNORMAL LOW (ref 4.22–5.81)
RDW: 15.1 % (ref 11.5–15.5)
WBC: 7.3 10*3/uL (ref 4.0–10.5)
nRBC: 0 % (ref 0.0–0.2)

## 2024-04-18 LAB — HEPATIC FUNCTION PANEL
ALT: 13 U/L (ref 0–44)
AST: 23 U/L (ref 15–41)
Albumin: 2.4 g/dL — ABNORMAL LOW (ref 3.5–5.0)
Alkaline Phosphatase: 65 U/L (ref 38–126)
Bilirubin, Direct: 0.2 mg/dL (ref 0.0–0.2)
Indirect Bilirubin: 0.5 mg/dL (ref 0.3–0.9)
Total Bilirubin: 0.7 mg/dL (ref 0.0–1.2)
Total Protein: 7.1 g/dL (ref 6.5–8.1)

## 2024-04-18 LAB — BASIC METABOLIC PANEL WITH GFR
Anion gap: 17 — ABNORMAL HIGH (ref 5–15)
BUN: 25 mg/dL — ABNORMAL HIGH (ref 8–23)
CO2: 25 mmol/L (ref 22–32)
Calcium: 8.6 mg/dL — ABNORMAL LOW (ref 8.9–10.3)
Chloride: 94 mmol/L — ABNORMAL LOW (ref 98–111)
Creatinine, Ser: 7.61 mg/dL — ABNORMAL HIGH (ref 0.61–1.24)
GFR, Estimated: 7 mL/min — ABNORMAL LOW (ref >60)
Glucose, Bld: 132 mg/dL — ABNORMAL HIGH (ref 70–99)
Potassium: 4.4 mmol/L (ref 3.5–5.1)
Sodium: 136 mmol/L (ref 135–145)

## 2024-04-18 LAB — VITAMIN B12: Vitamin B-12: 427 pg/mL (ref 180–914)

## 2024-04-18 LAB — POCT ACTIVATED CLOTTING TIME
Activated Clotting Time: 228 s
Activated Clotting Time: 193 s
Activated Clotting Time: 175 s

## 2024-04-18 LAB — LACTIC ACID, PLASMA: Lactic Acid, Venous: 1.4 mmol/L (ref 0.5–1.9)

## 2024-04-18 LAB — CBG MONITORING, ED
Glucose-Capillary: 144 mg/dL — ABNORMAL HIGH (ref 70–99)
Glucose-Capillary: 138 mg/dL — ABNORMAL HIGH (ref 70–99)

## 2024-04-18 LAB — PHOSPHORUS: Phosphorus: 6.8 mg/dL — ABNORMAL HIGH (ref 2.5–4.6)

## 2024-04-18 LAB — TSH: TSH: 0.466 u[IU]/mL (ref 0.350–4.500)

## 2024-04-18 LAB — HEPATITIS B SURFACE ANTIGEN: Hepatitis B Surface Ag: NONREACTIVE

## 2024-04-18 LAB — GLUCOSE, CAPILLARY: Glucose-Capillary: 109 mg/dL — ABNORMAL HIGH (ref 70–99)

## 2024-04-18 LAB — AMMONIA: Ammonia: 27 umol/L (ref 9–35)

## 2024-04-18 LAB — PREALBUMIN: Prealbumin: 14 mg/dL — ABNORMAL LOW (ref 18–38)

## 2024-04-18 MED ORDER — CLOPIDOGREL BISULFATE 300 MG PO TABS
ORAL_TABLET | ORAL | Status: DC | PRN
  Administered 2024-04-18: 300 mg via ORAL

## 2024-04-18 MED ORDER — UMECLIDINIUM-VILANTEROL 62.5-25 MCG/ACT IN AEPB
1.0000 | INHALATION_SPRAY | Freq: Every day | RESPIRATORY_TRACT | Status: DC
  Administered 2024-04-19 – 2024-04-25 (×6): 1 via RESPIRATORY_TRACT
  Filled 2024-04-18: qty 14

## 2024-04-18 MED ORDER — SODIUM CHLORIDE 0.9% FLUSH
3.0000 mL | INTRAVENOUS | Status: DC | PRN
Start: 2024-04-18 — End: 2024-04-29

## 2024-04-18 MED ORDER — LIDOCAINE HCL (PF) 1 % IJ SOLN
INTRAMUSCULAR | Status: DC | PRN
Start: 2024-04-18 — End: 2024-04-18
  Administered 2024-04-18: 15 mL

## 2024-04-18 MED ORDER — CLOPIDOGREL BISULFATE 300 MG PO TABS
ORAL_TABLET | ORAL | Status: AC
Start: 2024-04-18 — End: 2024-04-18
  Filled 2024-04-18: qty 1

## 2024-04-18 MED ORDER — ONDANSETRON HCL 4 MG/2ML IJ SOLN: 4.0000 mg | Freq: Four times a day (QID) | INTRAMUSCULAR | Status: DC | PRN

## 2024-04-18 MED ORDER — CHLORHEXIDINE GLUCONATE CLOTH 2 % EX PADS
6.0000 | MEDICATED_PAD | Freq: Every day | CUTANEOUS | Status: DC
  Administered 2024-04-24 – 2024-04-28 (×4): 6 via TOPICAL

## 2024-04-18 MED ORDER — SODIUM CHLORIDE 0.9% FLUSH
3.0000 mL | Freq: Two times a day (BID) | INTRAVENOUS | Status: DC
Start: 2024-04-18 — End: 2024-04-29
  Administered 2024-04-18 – 2024-04-24 (×3): 3 mL via INTRAVENOUS

## 2024-04-18 MED ORDER — LIDOCAINE HCL (PF) 1 % IJ SOLN
INTRAMUSCULAR | Status: AC
  Filled 2024-04-18: qty 30

## 2024-04-18 MED ORDER — SODIUM CHLORIDE 0.9 % IV SOLN: 250.0000 mL | INTRAVENOUS | Status: AC | PRN

## 2024-04-18 MED ORDER — SODIUM CHLORIDE 0.9% FLUSH: 3.0000 mL | INTRAVENOUS | Status: DC | PRN

## 2024-04-18 MED ORDER — HYDRALAZINE HCL 20 MG/ML IJ SOLN: 5.0000 mg | INTRAMUSCULAR | Status: DC | PRN

## 2024-04-18 MED ORDER — LABETALOL HCL 5 MG/ML IV SOLN
10.0000 mg | INTRAVENOUS | Status: DC | PRN
Start: 2024-04-18 — End: 2024-04-18

## 2024-04-18 MED ORDER — LABETALOL HCL 5 MG/ML IV SOLN: 10.0000 mg | INTRAVENOUS | Status: DC | PRN

## 2024-04-18 MED ORDER — HEPARIN (PORCINE) IN NACL 2000-0.9 UNIT/L-% IV SOLN
INTRAVENOUS | Status: DC | PRN
Start: 2024-04-18 — End: 2024-04-18
  Administered 2024-04-18: 1000 mL

## 2024-04-18 MED ORDER — ATORVASTATIN CALCIUM 10 MG PO TABS
10.0000 mg | ORAL_TABLET | Freq: Every day | ORAL | Status: DC
  Administered 2024-04-18 – 2024-04-20 (×3): 10 mg via ORAL
  Filled 2024-04-18 (×3): qty 1

## 2024-04-18 MED ORDER — ASPIRIN 81 MG PO TBEC
81.0000 mg | DELAYED_RELEASE_TABLET | Freq: Every day | ORAL | Status: DC
  Administered 2024-04-18 – 2024-04-27 (×9): 81 mg via ORAL
  Filled 2024-04-18 (×9): qty 1

## 2024-04-18 MED ORDER — FENTANYL CITRATE (PF) 100 MCG/2ML IJ SOLN
INTRAMUSCULAR | Status: AC
Start: 2024-04-18 — End: 2024-04-18
  Filled 2024-04-18: qty 2

## 2024-04-18 MED ORDER — MIDAZOLAM HCL 2 MG/2ML IJ SOLN
INTRAMUSCULAR | Status: AC
  Filled 2024-04-18: qty 2

## 2024-04-18 MED ORDER — INSULIN ASPART 100 UNIT/ML IJ SOLN
0.0000 [IU] | Freq: Three times a day (TID) | INTRAMUSCULAR | Status: DC
  Administered 2024-04-18 (×2): 1 [IU] via SUBCUTANEOUS
  Administered 2024-04-20: 3 [IU] via SUBCUTANEOUS
  Administered 2024-04-20: 1 [IU] via SUBCUTANEOUS
  Administered 2024-04-20: 3 [IU] via SUBCUTANEOUS
  Administered 2024-04-21 (×3): 2 [IU] via SUBCUTANEOUS
  Administered 2024-04-22: 1 [IU] via SUBCUTANEOUS
  Administered 2024-04-22 – 2024-04-23 (×2): 2 [IU] via SUBCUTANEOUS
  Administered 2024-04-23 – 2024-04-24 (×3): 1 [IU] via SUBCUTANEOUS
  Administered 2024-04-25 – 2024-04-26 (×3): 2 [IU] via SUBCUTANEOUS
  Administered 2024-04-26 – 2024-04-27 (×3): 1 [IU] via SUBCUTANEOUS

## 2024-04-18 MED ORDER — BICTEGRAVIR-EMTRICITAB-TENOFOV 50-200-25 MG PO TABS
1.0000 | ORAL_TABLET | Freq: Every day | ORAL | Status: DC
  Administered 2024-04-19 – 2024-04-27 (×8): 1 via ORAL
  Filled 2024-04-18 (×10): qty 1

## 2024-04-18 MED ORDER — HYDROMORPHONE HCL 1 MG/ML IJ SOLN
0.5000 mg | INTRAMUSCULAR | Status: DC | PRN
  Administered 2024-04-19: 0.5 mg via INTRAVENOUS
  Filled 2024-04-18: qty 0.5

## 2024-04-18 MED ORDER — ALLOPURINOL 100 MG PO TABS
100.0000 mg | ORAL_TABLET | Freq: Every day | ORAL | Status: DC
  Administered 2024-04-18 – 2024-04-27 (×9): 100 mg via ORAL
  Filled 2024-04-18 (×9): qty 1

## 2024-04-18 MED ORDER — CLOPIDOGREL BISULFATE 75 MG PO TABS: 75.0000 mg | ORAL_TABLET | Freq: Every day | ORAL | Status: DC

## 2024-04-18 MED ORDER — HEPARIN SODIUM (PORCINE) 1000 UNIT/ML IJ SOLN
INTRAMUSCULAR | Status: DC | PRN
  Administered 2024-04-18: 8000 [IU] via INTRAVENOUS
  Administered 2024-04-18: 2000 [IU] via INTRAVENOUS

## 2024-04-18 MED ORDER — FENTANYL CITRATE (PF) 100 MCG/2ML IJ SOLN
INTRAMUSCULAR | Status: DC | PRN
  Administered 2024-04-18: 25 ug via INTRAVENOUS

## 2024-04-18 MED ORDER — ACETAMINOPHEN 650 MG RE SUPP: 650.0000 mg | Freq: Four times a day (QID) | RECTAL | Status: DC | PRN

## 2024-04-18 MED ORDER — CLOPIDOGREL BISULFATE 75 MG PO TABS
300.0000 mg | ORAL_TABLET | Freq: Once | ORAL | Status: AC
  Administered 2024-04-18: 300 mg via ORAL
  Filled 2024-04-18: qty 4

## 2024-04-18 MED ORDER — MIDAZOLAM HCL 2 MG/2ML IJ SOLN
INTRAMUSCULAR | Status: DC | PRN
Start: 2024-04-18 — End: 2024-04-18
  Administered 2024-04-18: 1 mg via INTRAVENOUS

## 2024-04-18 MED ORDER — HEPARIN SODIUM (PORCINE) 1000 UNIT/ML IJ SOLN
INTRAMUSCULAR | Status: AC
  Filled 2024-04-18: qty 10

## 2024-04-18 MED ORDER — SODIUM CHLORIDE 0.9% FLUSH
3.0000 mL | Freq: Two times a day (BID) | INTRAVENOUS | Status: DC
  Administered 2024-04-18 – 2024-04-24 (×4): 3 mL via INTRAVENOUS

## 2024-04-18 MED ORDER — INSULIN ASPART 100 UNIT/ML IJ SOLN
0.0000 [IU] | Freq: Every day | INTRAMUSCULAR | Status: DC
  Administered 2024-04-19 – 2024-04-23 (×2): 2 [IU] via SUBCUTANEOUS

## 2024-04-18 MED ORDER — ACETAMINOPHEN 325 MG PO TABS
650.0000 mg | ORAL_TABLET | Freq: Four times a day (QID) | ORAL | Status: DC | PRN
Start: 2024-04-18 — End: 2024-04-29
  Administered 2024-04-21: 650 mg via ORAL
  Filled 2024-04-18: qty 2

## 2024-04-18 MED ORDER — ONDANSETRON HCL 4 MG PO TABS: 4.0000 mg | ORAL_TABLET | Freq: Four times a day (QID) | ORAL | Status: DC | PRN

## 2024-04-18 MED ORDER — SODIUM CHLORIDE 0.9 % IV SOLN
250.0000 mL | INTRAVENOUS | Status: AC | PRN
Start: 2024-04-18 — End: 2024-04-19

## 2024-04-18 MED ORDER — CLOPIDOGREL BISULFATE 75 MG PO TABS: 300.0000 mg | ORAL_TABLET | Freq: Once | ORAL | Status: DC

## 2024-04-18 MED ORDER — HEPARIN SODIUM (PORCINE) 5000 UNIT/ML IJ SOLN
5000.0000 [IU] | Freq: Three times a day (TID) | INTRAMUSCULAR | Status: DC
  Administered 2024-04-18 – 2024-04-27 (×21): 5000 [IU] via SUBCUTANEOUS
  Filled 2024-04-18 (×21): qty 1

## 2024-04-18 MED ORDER — GABAPENTIN 300 MG PO CAPS
300.0000 mg | ORAL_CAPSULE | Freq: Every day | ORAL | Status: DC
  Administered 2024-04-18 – 2024-04-19 (×2): 300 mg via ORAL
  Filled 2024-04-18 (×3): qty 1

## 2024-04-18 MED ORDER — IODIXANOL 320 MG/ML IV SOLN
INTRAVENOUS | Status: DC | PRN
  Administered 2024-04-18: 60 mL

## 2024-04-18 MED ORDER — CLOPIDOGREL BISULFATE 75 MG PO TABS
75.0000 mg | ORAL_TABLET | Freq: Every day | ORAL | Status: DC
  Administered 2024-04-19 – 2024-04-23 (×5): 75 mg via ORAL
  Filled 2024-04-18 (×5): qty 1

## 2024-04-18 NOTE — Assessment & Plan Note (Signed)
 New finding on most recent echo.  Unclear significance.  It is interesting that this was noted, at the also the patient had an abrupt change in his RLE ABIs from Feb to May of this year. - Consult cardiology

## 2024-04-18 NOTE — Consult Note (Addendum)
 Renal Service Consult Note [name]  KEONA SHEFFLER 04/18/2024 Lamar JONETTA Fret, MD Requesting Physician: Dr. Jonel  Reason for Consult: ESRD patient with ischemic leg wound HPI: The patient is a 73 y.o. year-old w/ PMH as below who presented to ED yesterday for evaluation of chronic wound on his leg.  Patient had been to his wound care doctor and they recommended he go to the emergency room because of ongoing pain in the leg. Patient wanted to go to his dialysis first so he had his full dialysis yesterday then came to ED.  Patient was hypoxic on arrival at 89% O2 sat.  Put on 2 L O2.  In the ED BP was 99/84, HR 73, RR 14-23, temp 97.7.  K+ 4.2, BUN 17, creatinine 6.6, WBC 8K, Hgb 12.9.  Chest x-ray not done yet. Pt was admitted and cardiology was consulted.  We are asked to see for dialysis.   Pt seen in ED room.  Patient is quite lethargic, he does awaken to voice and answer simple questions then falls back asleep.  Denies any shortness of breath, cough or chest pain   ROS - denies CP, no joint pain, no HA, no blurry vision, no rash, no diarrhea, no nausea/ vomiting   Past Medical History  Past Medical History:  Diagnosis Date   Acute respiratory failure (HCC) 03/01/2018   Anemia    Arthritis    all over; mostly knees and back (02/28/2018)   Chronic combined systolic and diastolic CHF (congestive heart failure) (HCC)    Chronic lower back pain    stenosis   Community acquired pneumonia 09/06/2013   COPD (chronic obstructive pulmonary disease) (HCC)    Coronary atherosclerosis of native coronary artery    a. 02/2003 s/p CABG x 2 (VG->RI, VG->RPDA; b. 11/2019 PCI: LM nl, LAD 90d, D3 50, RI 100, LCX 100p, OM3 100 - fills via L->L collats from D2/dLAD, RCA 100p, VG->RPDA ok, VG->RI 95 (3.5x48 Synergy XD DES).   Drug abuse (HCC)    hx; tested for cocaine as recently as 2/08. says he is not using drugs now - avoided defib. for this reason    ESRD (end stage renal  disease) (HCC)    Hemo M-W-F- [facility]   Fall at home 10/2020   GERD (gastroesophageal reflux disease)    takes OTC meds as needed   GI bleeding    a. 11/2019 EGD: angiodysplastic lesions w/ bleeding s/p argon plasma/clipping/epi inj. Multiple admissions for the same.   Glaucoma    uses eye drops daily   Hepatitis B 1968   tx'd w/isolation; caught it from toilet stools in gym   History of blood transfusion 03/01/2019   History of colon polyps    benign   History of gout    takes Allopurinol  daily as well as Colchicine -if needed (02/28/2018)   History of kidney stones    HTN (hypertension)    takes Coreg ,Imdur .and Apresoline  daily   Human immunodeficiency virus (HIV) disease (HCC) dx'd 1995   on Biktarvy  as of 12/2020.     Hyperlipidemia    Ischemic cardiomyopathy    a. 01/2019 Echo: EF 40-45%, diffuse HK, mild basal septal hypertrophy. Diast dysfxn. Nl RV size/fxn. Sev dil LA. Triv MR/TR/PR.   Muscle spasm    takes Zanaflex  as needed   Myocardial infarction (HCC) ~ 2004/2005   Nocturia    Peripheral neuropathy    takes gabapentin  daily   Pneumonia    at least twice (  02/28/2018)   SDH (subdural hematoma) (HCC)    Syphilis, unspecified    Type II diabetes mellitus (HCC) 2004   Lantus  daily.Average fasting blood sugar 125-199   Wears glasses    Wears partial dentures    Past Surgical History  Past Surgical History:  Procedure Laterality Date   A/V FISTULAGRAM N/A 12/07/2023   Procedure: A/V Fistulagram;  Surgeon: Norine Manuelita LABOR, MD;  [facility]: [facility] INVASIVE CV LAB;  Service: Cardiovascular;  Laterality: N/A;   AV FISTULA PLACEMENT Left 08/02/2018   Procedure: ARTERIOVENOUS (AV) FISTULA CREATION  left arm radiocephlic;  Surgeon: Gretta Lonni PARAS, MD;  Location[facility] OR;  Service: Vascular;  Laterality: Left;   AV FISTULA PLACEMENT Left 08/01/2019   Procedure: LEFT BRACHIOCEPHALIC ARTERIOVENOUS (AV) FISTULA CREATION;  Surgeon: Oris Krystal FALCON, MD;  Location: [facility] OR;  Service:  Vascular;  Laterality: Left;   AV FISTULA PLACEMENT Right 04/12/2022   Procedure: RIGHT UPPER EXTREMITY ARTERIOVENOUS (AV)  GRAFT INSERTION USING GORE STRETCH 4-7 MM;  Surgeon: Eliza Lonni RAMAN, MD;  Location: [facility] OR;  Service: Vascular;  Laterality: Right;   BASCILIC VEIN TRANSPOSITION Left 10/03/2019   Procedure: BASILIC VEIN TRANSPOSITION LEFT SECOND STAGE;  Surgeon: Oris Krystal FALCON, MD;  Location: [facility] OR;  Service: Vascular;  Laterality: Left;   BIOPSY  01/25/2021   Procedure: BIOPSY;  Surgeon: Legrand Victory LITTIE DOUGLAS, MD;  Location: [facility] ENDOSCOPY;  Service: Gastroenterology;;   CARDIAC CATHETERIZATION  10/2002; 12/19/2004   thelbert 03/08/2011   COLONOSCOPY  2013   Dr.John Abran    CORONARY ARTERY BYPASS GRAFT  02/24/2003   CABG X2/notes 03/08/2011   CORONARY STENT INTERVENTION N/A 12/19/2019   Procedure: CORONARY STENT INTERVENTION;  Surgeon: Dann Candyce RAMAN, MD;  [facility];  Service: Cardiovascular;  Laterality: N/A;   CORONARY ULTRASOUND/IVUS N/A 12/19/2019   Procedure: Intravascular Ultrasound/IVUS;  Surgeon: Dann Candyce RAMAN, MD;  Location: [facility] INVASIVE CV LAB;  Service: Cardiovascular;  Laterality: N/A;   ENTEROSCOPY N/A 01/25/2021   Procedure: ENTEROSCOPY;  Surgeon: Legrand Victory LITTIE DOUGLAS, MD;  Location: [facility] ENDOSCOPY;  Service: Gastroenterology;  Laterality: N/A;   ENTEROSCOPY N/A 02/13/2021   Procedure: ENTEROSCOPY;  Surgeon: Charlanne Groom, MD;  Location: [facility] ENDOSCOPY;  Service: Endoscopy;  Laterality: N/A;   ENTEROSCOPY N/A 05/07/2021   Procedure: ENTEROSCOPY;  Surgeon: Leigh Elspeth SQUIBB, MD;  Location: [facility] ENDOSCOPY;  Service: Gastroenterology;  Laterality: N/A;   ESOPHAGOGASTRODUODENOSCOPY (EGD) WITH PROPOFOL  N/A 02/08/2019   Procedure: ESOPHAGOGASTRODUODENOSCOPY (EGD) WITH PROPOFOL ;  Surgeon: Teressa Toribio SQUIBB, MD;  Location: [facility] ENDOSCOPY;  Service: Gastroenterology;  Laterality: N/A;   ESOPHAGOGASTRODUODENOSCOPY (EGD) WITH PROPOFOL  N/A 12/22/2019   Procedure:  ESOPHAGOGASTRODUODENOSCOPY (EGD) WITH PROPOFOL ;  Surgeon: [name] Sandor GAILS, DO;  [facility] ENDOSCOPY;  Service: Gastroenterology;  Laterality: N/A;   ESOPHAGOGASTRODUODENOSCOPY (EGD) WITH PROPOFOL  N/A 10/19/2020   Procedure: ESOPHAGOGASTRODUODENOSCOPY (EGD) WITH PROPOFOL ;  Surgeon: Charlanne Groom, MD;  Location: [facility] ENDOSCOPY;  Service: Endoscopy;  Laterality: N/A;   ESOPHAGOGASTRODUODENOSCOPY (EGD) WITH PROPOFOL  N/A 12/22/2020   Procedure: ESOPHAGOGASTRODUODENOSCOPY (EGD) WITH PROPOFOL ;  Surgeon: Avram Lupita BRAVO, MD;  Location: [facility] ENDOSCOPY;  Service: Endoscopy;  Laterality: N/A;   ESOPHAGOGASTRODUODENOSCOPY (EGD) WITH PROPOFOL  N/A 01/09/2021   Procedure: ESOPHAGOGASTRODUODENOSCOPY (EGD) WITH PROPOFOL ;  Surgeon: Abran Norleen SAILOR, MD;  Location: [facility] ENDOSCOPY;  Service: Endoscopy;  Laterality: N/A;   HEMOSTASIS CLIP PLACEMENT  12/22/2019   Procedure: HEMOSTASIS CLIP PLACEMENT;  Surgeon: [name] Sandor GAILS, DO;  [facility] ENDOSCOPY;  Service: Gastroenterology;;   HEMOSTASIS CLIP PLACEMENT  12/22/2020  Procedure: HEMOSTASIS CLIP PLACEMENT;  Surgeon: Avram Lupita BRAVO, MD;  Location: [facility] ENDOSCOPY;  Service: Endoscopy;;   HEMOSTASIS CONTROL  12/22/2020   Procedure: HEMOSTASIS CONTROL/hemospray;  Surgeon: Avram Lupita BRAVO, MD;  Location: [facility] ENDOSCOPY;  Service: Endoscopy;;   HOT HEMOSTASIS N/A 02/08/2019   Procedure: HOT HEMOSTASIS (ARGON PLASMA COAGULATION/BICAP);  Surgeon: Teressa Toribio SQUIBB, MD;  Location: [facility] ENDOSCOPY;  Service: Gastroenterology;  Laterality: N/A;   HOT HEMOSTASIS N/A 12/22/2019   Procedure: HOT HEMOSTASIS (ARGON PLASMA COAGULATION/BICAP);  Surgeon: [name] Sandor GAILS, DO;  Location: [facility] ENDOSCOPY;  Service: Gastroenterology;  Laterality: N/A;   HOT HEMOSTASIS N/A 10/19/2020   Procedure: HOT HEMOSTASIS (ARGON PLASMA COAGULATION/BICAP);  Surgeon: Charlanne Groom, MD;  Location: [facility] ENDOSCOPY;  Service: Endoscopy;  Laterality: N/A;   HOT HEMOSTASIS N/A 12/22/2020   Procedure: HOT HEMOSTASIS (ARGON  PLASMA COAGULATION/BICAP);  Surgeon: Avram Lupita BRAVO, MD;  Location: [facility] ENDOSCOPY;  Service: Endoscopy;  Laterality: N/A;   HOT HEMOSTASIS N/A 01/09/2021   Procedure: HOT HEMOSTASIS (ARGON PLASMA COAGULATION/BICAP);  Surgeon: Abran Norleen SAILOR, MD;  Location: [facility] ENDOSCOPY;  Service: Endoscopy;  Laterality: N/A;   HOT HEMOSTASIS N/A 01/25/2021   Procedure: HOT HEMOSTASIS (ARGON PLASMA COAGULATION/BICAP);  Surgeon: Legrand Victory LITTIE DOUGLAS, MD;  Location: [facility] ENDOSCOPY;  Service: Gastroenterology;  Laterality: N/A;   HOT HEMOSTASIS N/A 02/13/2021   Procedure: HOT HEMOSTASIS (ARGON PLASMA COAGULATION/BICAP);  Surgeon: Charlanne Groom, MD;  Location: [facility] ENDOSCOPY;  Service: Endoscopy;  Laterality: N/A;   HOT HEMOSTASIS N/A 05/07/2021   Procedure: HOT HEMOSTASIS (ARGON PLASMA COAGULATION/BICAP);  Surgeon: Leigh Elspeth SQUIBB, MD;  Location: [facility] ENDOSCOPY;  Service: Gastroenterology;  Laterality: N/A;   INSERTION OF DIALYSIS CATHETER N/A 02/08/2022   Procedure: INSERTION OF TUNNELED DIALYSIS CATHETER;  Surgeon: Eliza Lonni RAMAN, MD;  Location: [facility] OR;  Service: Vascular;  Laterality: N/A;   INTERTROCHANTERIC HIP FRACTURE SURGERY Left 11/2006   thelbert 03/08/2011   IR FLUORO GUIDE CV LINE LEFT  03/14/2022   IR FLUORO GUIDE CV LINE RIGHT  07/24/2019   IR FLUORO GUIDE CV LINE RIGHT  07/30/2019   IR US  GUIDE VASC ACCESS RIGHT  07/24/2019   IR US  GUIDE VASC ACCESS RIGHT  07/30/2019   LAPAROSCOPIC CHOLECYSTECTOMY  05/2006   LIGATION OF ARTERIOVENOUS  FISTULA Left 02/08/2022   Procedure: LIGATION OF LEFT ARTERIOVENOUS  FISTULA;  Surgeon: Eliza Lonni RAMAN, MD;  Location: [facility] OR;  Service: Vascular;  Laterality: Left;   LIGATION OF COMPETING BRANCHES OF ARTERIOVENOUS FISTULA Left 11/05/2018   Procedure: LIGATION OF COMPETING BRANCHES OF ARTERIOVENOUS FISTULA  LEFT  ARM;  Surgeon: Gretta Lonni PARAS, MD;  Location: [facility] OR;  Service: Vascular;  Laterality: Left;   LUMBAR LAMINECTOMY/DECOMPRESSION MICRODISCECTOMY N/A 02/29/2016    Procedure: Left L4-5 Lateral Recess Decompression, Removal Extradural Intraspinal Facet Cyst;  Surgeon: Oneil JAYSON Herald, MD;  Location: [facility] OR;  Service: Orthopedics;  Laterality: N/A;   METATARSAL HEAD EXCISION Right 07/20/2022   Procedure: Irragation and debridement of right foot wound with extraction of all necrotic soft tissue and bone;  Surgeon: Malvin Marsa FALCON, DPM;  Location: [facility] OR;  Service: Podiatry;  Laterality: Right;  Right 4th Met head and base of proximal phalanx resection   MULTIPLE TOOTH EXTRACTIONS     ORIF MANDIBULAR FRACTURE Left 08/13/2004   ORIF of left body fracture mandible with KLS Martin 2.3-mm six hole/notes 03/08/2011   RIGHT/LEFT HEART CATH AND CORONARY/GRAFT ANGIOGRAPHY N/A 12/19/2019   Procedure: RIGHT/LEFT HEART CATH AND CORONARY/GRAFT ANGIOGRAPHY;  Surgeon: Dann Candyce RAMAN, MD;  [facility];  Service: Cardiovascular;  Laterality: N/A;   SCLEROTHERAPY  12/22/2019   Procedure: SCLEROTHERAPY;  Surgeon: [name] Sandor GAILS, DO;  Location: [facility] ENDOSCOPY;  Service: Gastroenterology;;   MATIAS  02/13/2021   Procedure: MATIAS;  Surgeon: Charlanne Groom, MD;  Location: [facility] ENDOSCOPY;  Service: Endoscopy;;   Family History  Family History  Problem Relation Age of Onset   Heart failure Father    Hypertension Father    Diabetes Brother    Heart attack Brother    Alzheimer's disease Mother    Stroke Sister    Diabetes Sister    Alzheimer's disease Sister    Hypertension Brother    Diabetes Brother    Drug abuse Brother    Colon cancer Neg Hx    Social History  reports that he has been smoking cigarettes. He has a 21.5 pack-year smoking history. He has been exposed to tobacco smoke. He has never used smokeless tobacco. He reports that he does not currently use alcohol after a past usage of about 12.0 standard drinks of alcohol per week. He reports that he does not currently use drugs after having used the following drugs:  Cocaine. Allergies  Allergies  Allergen Reactions   Augmentin  [Amoxicillin -Pot Clavulanate] Diarrhea and Other (See Comments)    Severe diarrhea   Morphine  Sulfate Anaphylaxis    Can't mix with oxycodone     Mucinex  Fast-Max Other (See Comments)    Intense sweating    Amphetamines Other (See Comments)    Unknown reaction   Home medications Prior to Admission medications   Medication Sig Start Date End Date Taking? Authorizing Provider  acetaminophen  (TYLENOL ) 650 MG CR tablet Take 1,300 mg by mouth 3 (three) times daily.    [provider]  albuterol  (PROVENTIL ) (2.5 MG/3ML) 0.083% nebulizer solution Take 3 mLs (2.5 mg total) by nebulization every 6 (six) hours as needed for wheezing or shortness of breath. 06/20/23 06/19/24  [name], Dinah C, NP  allopurinol  (ZYLOPRIM ) 100 MG tablet TAKE 1 TABLET BY MOUTH DAILY 11/01/23   Sherlynn Madden, MD  atorvastatin  (LIPITOR ) 10 MG tablet TAKE 1 TABLET(10 MG) BY MOUTH DAILY. 11/23/23   Wyn Jackee VEAR Mickey., NP  B-D UF III MINI PEN NEEDLES 31G X 5 MM MISC Inject 1 Box into the skin 4 (four) times daily. 04/15/24   de [place], Raymond J, MD  BIKTARVY  50-200-25 MG TABS tablet TAKE 1 TABLET BY MOUTH 1 TIME A DAY 01/04/24   Efrain Lamar ORN, MD  Calcium  Acetate 667 MG TABS Take 667-2,001 mg by mouth See admin instructions. Take 2001 mg by mouth three times a day with meals and 667 mg with each snack 01/20/21   [provider]  carvedilol  (COREG ) 3.125 MG tablet TAKE 1 TABLET(3.125 MG) BY MOUTH TWICE DAILY WITH A MEAL 11/28/23   Dick, Ernest H Jr., NP  diclofenac  Sodium (VOLTAREN ) 1 % GEL Apply 2 grams topically to the affected area three times daily as needed for pain. 12/19/22   Cleotilde Garnette HERO, MD  folic acid  (FOLVITE ) 1 MG tablet Take 1 mg by mouth daily.    [provider]  gabapentin  (NEURONTIN ) 300 MG capsule Take 1 capsule (300 mg total) by mouth in the morning and at bedtime. Patient is taking 1 tablet in the early evening after  dialysis and 1 tablet at bedtime 03/01/24   de [place], Quintin PARAS, MD  HUMALOG  KWIKPEN 100 UNIT/ML KwikPen Inject 15 Units into the skin 3 (three) times  daily. 01/02/24   Sherlynn Madden, MD  insulin  degludec (TRESIBA ) 100 UNIT/ML FlexTouch Pen Inject 15 Units into the skin daily. 3 01/02/24   Sherlynn Madden, MD  isosorbide  mononitrate (IMDUR ) 30 MG 24 hr tablet Take 1 tablet (30 mg total) by mouth daily. 04/12/24   de [place], Raymond J, MD  latanoprost  (XALATAN ) 0.005 % ophthalmic solution Place 1 drop into both eyes at bedtime.    [provider]  leptospermum manuka honey (MEDIHONEY) PSTE paste Apply 1 Application topically daily. Cleanse R lower leg wound with Vashe wound cleanser Soila [phone]), do not rinse and allow to air dry. Apply Medihoney to wound bed daily, cover with dry gauze and cover with silicone foam or ABD pad and Kerlix roll gauze to secure.Interchangeable with TheraHoney Apply thin layer (3 mm) to wound. 01/25/24   Hongalgi, Anand D, MD  lidocaine -prilocaine  (EMLA ) cream Apply topically as directed. At dialysis 10/30/23   [provider]  Menthol -Camphor (TIGER BALM ARTHRITIS RUB) 11-11 % CREA Apply 1 Application topically as needed (pain).    [provider]  midodrine  (PROAMATINE ) 10 MG tablet Take 10 mg by mouth 3 (three) times a week. 03/21/23   [provider]  multivitamin (RENA-VIT) TABS tablet Take 1 tablet by mouth daily.    [provider]  neomycin-bacitracin-polymyxin [phone] OINT Apply 1 Application topically in the morning and at bedtime. 12/26/23   Sherlynn Madden, MD  nitroGLYCERIN  (NITROSTAT ) 0.3 MG SL tablet Place 1 tablet (0.3 mg total) under the tongue every 5 (five) minutes as needed for chest pain. 02/27/24   Nishan, Peter C, MD  octreotide  (SANDOSTATIN  LAR DEPOT) 10 MG injection Inject 10 mg into the muscle every 28 days. 03/21/24   McMichael, Nestor HERO, PA-C  oxyCODONE -acetaminophen  (PERCOCET) 5-325 MG  tablet Take 1 tablet by mouth every 8 (eight) hours as needed for severe pain (pain score 7-10). 04/12/24   Urbano Albright, MD  polyethylene glycol (MIRALAX  / GLYCOLAX ) 17 g packet Take 17 g by mouth daily as needed for moderate constipation.    [provider]  PREVIDENT 5000 PLUS 1.1 % CREA dental cream Place 1 Application onto teeth at bedtime. 05/25/23   [provider]  simethicone  (MYLICON) 125 MG chewable tablet Chew 125 mg by mouth as needed for flatulence.    [provider]  timolol  (TIMOPTIC ) 0.5 % ophthalmic solution Place 1 drop into both eyes in the morning. 04/04/19   [provider]  umeclidinium-vilanterol (ANORO ELLIPTA ) 62.5-25 MCG/ACT AEPB Inhale 1 puff into the lungs daily. 06/07/23   Cleotilde Garnette HERO, MD     Vitals:   04/18/24 9561 04/18/24 0800 04/18/24 0823 04/18/24 0835  BP: (!) 111/58 (!) 100/57  (!) 100/57  Pulse: 73   73  Resp: 19 13  13   Temp:   97.9 F (36.6 C)   TempSrc:   Oral   SpO2: 100%  96% 96%  Weight:      Height:       Exam Gen lethargic, wakes up to voice, able to answer simple questions No rash, cyanosis or gangrene Sclera anicteric, throat clear  No jvd or bruits Chest bilat rales 1/2 up R, 1/3 up L RRR no MRG Abd soft ntnd no mass or ascites +bs GU defer MS no joint effusions or deformity, bilat feet are wrapped Ext 1+ bilat PT edema, no other edema Neuro is as above    RFA AVG+bruit      OP HD: [facility] MWF 3h  B400   86kg  2K  R AVG  HEparin  none Last OP HD 6/25, post wt 86.6kg Gets to dry wt, good compliance Low BP's in the 70-90s on HD    Assessment/ Plan: Ischemic wound right foot: per pmd ESRD: on HD MWF. No missed HD. HD today/ tonight due to pulm edema.  Hypoxia: +loud rales on exam --> pulm edema on CXR. HD today/ tonight as above Volume: mild LE edema, other as above Anemia of esrd: Hb 11-13 here, follow Secondary hyperparathyroidism: CCa in range, add phos, cont binders  ac  Myer Fret  MD [facility] 04/18/2024, 9:34 AM  Recent Labs  Lab 04/17/24 1615 04/18/24 0544  HGB 12.9* 11.6*  ALBUMIN   --  2.4*  CALCIUM  8.4* 8.6*  CREATININE 6.61* 7.61*  K 4.2 4.4   Inpatient medications:  allopurinol   100 mg Oral Daily   aspirin  EC  81 mg Oral Daily   atorvastatin   10 mg Oral Daily   bictegravir-emtricitabine -tenofovir  AF  1 tablet Oral Daily   gabapentin   300 mg Oral QHS   heparin   5,000 Units Subcutaneous Q8H   insulin  aspart  0-5 Units Subcutaneous QHS   insulin  aspart  0-9 Units Subcutaneous TID WC   umeclidinium-vilanterol  1 puff Inhalation Daily    acetaminophen  **OR** acetaminophen , ondansetron  **OR** ondansetron  (ZOFRAN ) IV, oxyCODONE 

## 2024-04-18 NOTE — Assessment & Plan Note (Signed)
 Peripheral vascular disease Cardiology were consulted.  Have not recommended CT angiogram or heparin . Recommended Vascular surgery consultation today. Suspect the ulcer is ischemic, no pulse found but foot not cold.  Patient confused but Cardiology and I have not suspected critical ischemia to this point. - Consult Vascular surgery - Start aspirin  - Continue Lipitor 

## 2024-04-18 NOTE — ED Notes (Signed)
 Called CCMD to verify monitoring

## 2024-04-18 NOTE — Progress Notes (Signed)
 Pt continues to refuse vitals and other care. Yells for help, but gets aggressive and demands we leave the room when we tell him he is not in [place] and currently hospitalized.

## 2024-04-18 NOTE — Assessment & Plan Note (Signed)
 No evidence of flare - Continue Anoro

## 2024-04-18 NOTE — Assessment & Plan Note (Signed)
 HIV quant last Jan undetectable - Continue Biktarvy 

## 2024-04-18 NOTE — Assessment & Plan Note (Signed)
 Hgb stable, no bleeding observed

## 2024-04-18 NOTE — Assessment & Plan Note (Signed)
 Glucose controlled - Hold Tresiba  given until diet started - Continue sliding scale corrections

## 2024-04-18 NOTE — Assessment & Plan Note (Signed)
 LFTs normal

## 2024-04-18 NOTE — Op Note (Signed)
 Patient name: DAIMIAN SUDBERRY MRN: [id number] DOB: 02/21/1951 Sex: male  04/18/2024 Pre-operative Diagnosis: Critical limb ischemia the right lower extremity with tissue loss (calf wound) Post-operative diagnosis:  Same Surgeon:  Lonni DOROTHA Gaskins, MD Procedure Performed: 1.  Ultrasound-guided access left common femoral artery 2.  Aortogram with catheter selection of aorta 3.  Right lower extremity arteriogram with catheter selection of the right common femoral artery 4.  Right distal SFA and popliteal artery angioplasty with shockwave lithotripsy (5 mm x 80 mm shockwave balloon x 400 pulses) 5.  Right proximal anterior tibial angioplasty with plain balloon and shockwave lithotripsy (3 mm x 80 mm Sterling and 3 mm x 80 mm shockwave balloon x 240 pulses) 6.  63 minutes of monitored moderate conscious sedation time  Indications: Patient is a 73 year old male that presents with right lower extremity tissue loss ongoing for the last 8 weeks.  He had dampened monophasic flow at the ankle with severely calcified tibial vessels.  He presents for angiogram and possible right leg intervention after risk-benefits discussed.  Findings:   Ultrasound-guided access left common femoral artery.  Aortogram showed patent infrarenal aorta with patent iliacs.  These are heavily calcified.  On the right he had a patent common femoral and profunda.  The proximal SFA had less than 50% stenosis that was not flow-limiting.  Heavily calcified vessels.  The distal SFA as well as the above and below-knee popliteal artery had multiple segments of calcified 50 to 60% stenosis.  He has single-vessel runoff in the anterior tibial but has a high-grade proximal stenosis greater than 80% at the ostium of the vessel.  The remainder of the vessel is patent with filling of the dorsalis pedis.  Ultimately the right distal SFA as well as the entire above and below-knee popliteal artery was treated with a 5 mm shockwave balloon for  total of 400 pulses in the entire segment inflated at 2 atm, 4 atm and 6 atm in each segment treated.  I then attempted plain balloon angioplasty of the proximal anterior tibial and the balloon did not open and there was still a high-grade stenosis due to severe calcification.  I then used a 3 mm shockwave balloon x 240 pulses here with better results.  He has maintained single-vessel runoff through the anterior tibial with inline flow.   Procedure:  The patient was identified in the holding area and taken to room 8.  The patient was then placed supine on the table and prepped and draped in the usual sterile fashion.  A time out was called.  Patient received Versed  and fentanyl  for conscious moderate sedation.  Vital signs were monitored including heart rate, respiratory rate, oxygenation and blood pressure.  I was present for all of moderate sedation.  Ultrasound was used to evaluate the left common femoral artery.  It was patent .  A digital ultrasound image was acquired.  A micropuncture needle was used to access the left common femoral artery under ultrasound guidance.  An 018 wire was advanced without resistance and a micropuncture sheath was placed.  The 018 wire was removed and a benson wire was placed.  The micropuncture sheath was exchanged for a 5 french sheath.  An omniflush catheter was advanced over the wire to the level of L-1.  An abdominal angiogram was obtained.  Next, using the omniflush catheter and a benson wire, the aortic bifurcation was crossed and the catheter was placed into theright external iliac artery and right runoff was  obtained.  Pertinent findings are noted above.  Ultimately elected for intervention.  I used a Bentson wire in the right SFA and upsized to a 6 [place] catapult sheath in the left groin over the aortic bifurcation placed into the right common femoral artery.  The patient was given 100 units/kg IV heparin .  I then used an 014 [place] core wire with a CXI catheter and was  able to get down into the popliteal artery below the knee and then cannulate the anterior tibial and got a wire to the distal anterior tibial where the artery appeared healthier.  I then used shockwave lithotripsy and performed lithotripsy of the entire distal SFA above the below-knee popliteal artery inflated to 2 atm, 4 atm and 6 atm with multiple pulses throughout the segments for a total of 400 pulses.  I then used a plain balloon angioplasty of the proximal anterior tibial using a 3 mm x 80 mm Sterling.  I still had a significant residual stenosis.  I then exchanged for a shockwave balloon using a 3 mm x 80 mm Shockwave lithotripsy balloon and inflated this to 2 atm, 4 atm and 6 atm again using a total of 240 pulses.  There was better results here and better flow.  Patient has preserved single-vessel runoff in the anterior tibial artery  Wires and catheters removed and put short 6 French sheath in the left groin.  Taken to recovery for sheath removal.  Plan: Optimized from a vascular surgery standpoint.  Has inline flow through anterior tibial with single-vessel runoff.  Aspirin  statin Plavix  for risk reduction.   Lonni DOROTHA Gaskins, MD [facility] Office: [phone]

## 2024-04-18 NOTE — ED Notes (Signed)
 Patient transported to cath lab

## 2024-04-18 NOTE — Assessment & Plan Note (Signed)
 On octreotide  monthly as an outpatient, hemoglobin appears stable, no reported bleeding

## 2024-04-18 NOTE — Assessment & Plan Note (Addendum)
 LV aneurysm resolved. EF down from 40% to 30-35% here -Hold carvedilol  until hemodynamics clear - Volume status per HD

## 2024-04-18 NOTE — Assessment & Plan Note (Signed)
-   Consult nephrology for HD.

## 2024-04-18 NOTE — Progress Notes (Signed)
 Rounding Note   Patient Name: Jacob [name] Date of Encounter: 04/18/2024  [facility] Cardiologist: Delford.  Subjective Patient denies chest pain or shortness of.  Scheduled Meds:  allopurinol   100 mg Oral Daily   aspirin  EC  81 mg Oral Daily   atorvastatin   10 mg Oral Daily   bictegravir-emtricitabine -tenofovir  AF  1 tablet Oral Daily   gabapentin   300 mg Oral QHS   heparin   5,000 Units Subcutaneous Q8H   insulin  aspart  0-5 Units Subcutaneous QHS   insulin  aspart  0-9 Units Subcutaneous TID WC   umeclidinium-vilanterol  1 puff Inhalation Daily   Continuous Infusions:  PRN Meds: acetaminophen  **OR** acetaminophen , ondansetron  **OR** ondansetron  (ZOFRAN ) IV, oxyCODONE    Vital Signs  Vitals:   04/18/24 0438 04/18/24 0800 04/18/24 0823 04/18/24 0835  BP: (!) 111/58 (!) 100/57  (!) 100/57  Pulse: 73   73  Resp: 19 13  13   Temp:   97.9 F (36.6 C)   TempSrc:   Oral   SpO2: 100%  96% 96%  Weight:      Height:       No intake or output data in the 24 hours ending 04/18/24 0843    04/17/2024   12:57 PM 03/05/2024    9:41 AM 02/22/2024    9:30 AM  Last 3 Weights  Weight (lbs) 175 lb 197 lb 9.6 oz 196 lb 12.8 oz  Weight (kg) 79.379 kg 89.631 kg 89.268 kg      Telemetry Not reviewed- Personally Reviewed  ECG   - Personally Reviewed  Physical Exam  GEN: No acute distress.   Neck: No JVD Cardiac: RRR, no murmurs, rubs, or gallops.  Respiratory: Clear to auscultation bilaterally. GI: Soft, nontender, non-distended  MS: No edema; No deformity. Neuro:  Nonfocal  Psych: Normal affect Extremities: Large nonhealing wound right  Labs High Sensitivity Troponin:  No results for input(s): TROPONINIHS in the last 720 hours.   Chemistry Recent Labs  Lab 04/17/24 1615 04/18/24 0544  NA 138 136  K 4.2 4.4  CL 94* 94*  CO2 27 25  GLUCOSE 145* 132*  BUN 17 25*  CREATININE 6.61* 7.61*  CALCIUM  8.4* 8.6*  PROT  --  7.1  ALBUMIN   --  2.4*  AST  --   23  ALT  --  13  ALKPHOS  --  65  BILITOT  --  0.7  GFRNONAA 8* 7*  ANIONGAP 17* 17*    Lipids No results for input(s): CHOL, TRIG, HDL, LABVLDL, LDLCALC, CHOLHDL in the last 168 hours.  Hematology Recent Labs  Lab 04/17/24 1615 04/18/24 0544  WBC 8.3 7.3  RBC 3.96* 3.52*  HGB 12.9* 11.6*  HCT 41.7 36.3*  MCV 105.3* 103.1*  MCH 32.6 33.0  MCHC 30.9 32.0  RDW 15.3 15.1  PLT 121* 141*   Thyroid   Recent Labs  Lab 04/18/24 0544  TSH 0.466    BNPNo results for input(s): BNP, PROBNP in the last 168 hours.  DDimer No results for input(s): DDIMER in the last 168 hours.   Radiology  No results found.  Cardiac Studies None   Patient Profile   Jacob [name] is a 73 y.o. male with a hx of CAD s/p CABG times 11/2002, ischemic cardiomyopathy, type 2 diabetes, hypertension, hyperlipidemia, HIV, hepatitis C, AV malformations and GI bleed, ESRD (Monday Wednesday Friday), tobacco use, prior cocaine use, RBBB, peripheral neuropathy, GERD who is being seen 04/17/2024 for the evaluation of chronic leg wound  at the request of Dr. Doretha.   Assessment & Plan  1.  Critical limb ischemia-patient has had a large nonhealing wound on his right calf for the last several months.  He has been seeing Dr. Harlene Eastern at the wound care center.  He was initially referred to me but unfortunately never made it to the office.  His most recent Doppler studies performed 03/14/2024 suggested tibial vessel disease bilaterally typical of a diabetic.  I have asked Dr. Medford Gaskins, vascular surgeon, to consult for peripheral angiography/ potential intervention for limb salvage.  I had a good discussion with the patient today.  He lives alone but is independent.  He takes public transportation to dialysis 3 days a week.  He does have several siblings in the area.  He is retired from Engineering geologist at [employer] and also at  [employer].  I believe he understands the gravity of the situation and is  able to make an informed decision to provide consent.  His cardiac issues are stable at this point.  He is going to sign off and leave the remainder of his care to the primary service and vascular surgery.  For questions or updates, please contact [facility] Please consult [email or web address] for contact info under     Signed, Dorn Lesches, MD  04/18/2024, 8:43 AM

## 2024-04-18 NOTE — Assessment & Plan Note (Signed)
 Remains very confused.  [place].  No focal weakness, dysarthria, numbness but exam limited due to confusion, generalized weakness, and inability to follow commands consistently.  TSH, ammonia, B12, LFTs all normal.  Is outside the window for tPA at the time of presentation, but embolic strokes should be considered in the differential given his left ventricular aneurysm. Not currently able to sit still for MRI - Obtain MRI when able

## 2024-04-18 NOTE — Progress Notes (Signed)
 Pt not allowing staff to take frequent vitals or check sugars. Insists he's in [place] and keeps asking to take a cab home. Spoke with his sister, Aldona, to help calm patient. Will continue to monitor.

## 2024-04-18 NOTE — Progress Notes (Signed)
 Progress Note   Patient: Jacob [name] FMW:[id number] DOB: 1950-12-29 DOA: 04/17/2024     1 DOS: the patient was seen and examined on 04/18/2024 at 8:04AM      Brief hospital course: 73 y.o. M with ESRD on HD MWF, HIV, HTN, sCHF EF 40-45%, hx apical aneurysm, DM, CAD s/p CABG 2004 last PCI 2021, hep C, hx GIB due to AVMs, still smoking, PVD of RLE, chronic RLE wound who presented with nonhealing right medial calf wound.    In the ER, patient was confused.  Cardiology evaluated the patient, recommended vascular surgery consult for angiogram tomorrow.        Assessment and Plan: * Ischemic leg wound Peripheral vascular disease Cardiology were consulted.  Have not recommended CT angiogram or heparin . Recommended Vascular surgery consultation today. Suspect the ulcer is ischemic, no pulse found but foot not cold.  Patient confused but Cardiology and I have not suspected critical ischemia to this point. - Consult Vascular surgery - Start aspirin  - Continue Lipitor     ESRD (end stage renal disease) [facility]) - Consult nephrology for HD  Human immunodeficiency virus (HIV) disease (HCC) HIV quant last Jan undetectable - Continue Biktarvy   Left ventricular aneurysm New finding on most recent echo.  Unclear significance.  It is interesting that this was noted, at the also the patient had an abrupt change in his RLE ABIs from Feb to May of this year. - Consult cardiology  History of GI bleeding due to AVMs On octreotide  monthly as an outpatient, hemoglobin appears stable, no reported bleeding   History of hepatitis C LFTs normal  Acute metabolic encephalopathy Remains very confused.  [place].  No focal weakness, dysarthria, numbness but exam limited due to confusion, generalized weakness, and inability to follow commands consistently.  TSH, ammonia, B12, LFTs all normal.  Is outside the window for tPA at the time of presentation, but embolic strokes should be  considered in the differential given his left ventricular aneurysm. Not currently able to sit still for MRI - Obtain MRI when able   Chronic obstructive pulmonary disease (HCC) No evidence of flare - Continue Anoro  Anemia of chronic disease Hgb stable, no bleeding observed  Chronic systolic CHF (congestive heart failure) (HCC) EF 40-45%. -Hold carvedilol  until hemodynamics clear - Volume status per HD  DM (diabetes mellitus) (HCC) Glucose controlled - Hold Tresiba  given until diet started - Continue sliding scale corrections   Coronary artery disease with angina Hypertension - Continue Lipitor  (had been stopped outpatient due to muscle aches) - Check CK - Hold carvedilol  and Imdur  due to soft BP            Subjective: Patient remains very confused.  Overnight he was calling 911 from his cell phone.  He has pain in the right leg.  [place].  He denies other complaints.     Physical Exam: BP (!) 100/57   Pulse 73   Temp 97.9 F (36.6 C) (Oral)   Resp 13   Ht 6' 2 (1.88 m)   Wt 79.4 kg   SpO2 96%   BMI 22.47 kg/m   Elderly adult male, sedated and sluggish RRR, no murmurs, unchanged pitting edema in the right shin Respiratory rate slow, lungs diminished, I do not appreciate rales or wheezes Abdomen without focal tenderness to palpation Attention diminished, psychomotor slowing noted, unchanged from yesterday, he seems disoriented and inattentive.  He has severe severe generalized weakness.  Data Reviewed: Basic metabolic panel shows BUN 25, bicarb 25.  Creatinine elevated due to end-stage renal disease. CBC shows no leukocytosis, hemoglobin stable at 11.6, platelets stable at 141      Family Communication:     Disposition: Status is: Inpatient         Author: Lonni SHAUNNA Dalton, MD 04/18/2024 8:38 AM  For on call review [email or web address].

## 2024-04-18 NOTE — Assessment & Plan Note (Addendum)
 Hypertension - Continue Lipitor  (had been stopped outpatient due to muscle aches) - Check CK - Hold carvedilol  and Imdur  due to soft BP

## 2024-04-18 NOTE — Consult Note (Signed)
 Hospital Consult    Reason for Consult: Critical limb ischemia right lower extremity with wound Referring Physician: Dr. Wadie MRN #:  [id number]  History of Present Illness: This is a 73 y.o. male with history of ESRD, HIV, hepatitis C, peripheral vascular disease, COPD, systolic CHF with an EF of 40-45%, diabetes, coronary artery disease that vascular surgery been consulted for right lower extremity angiogram due to tissue loss.  Patient has been evaluated by Dr. Court and has had a right calf wound.  Seen in the ED yesterday by cardiology.  Vascular asked to see if we could schedule his angiogram today.  Patient states his right leg wound has been present for about 8 weeks.  Going to the wound clinic.  Severe tibial disease noted on duplex imaging in the right leg with dampened monophasic flow at the ankle  Past Medical History:  Diagnosis Date   Acute respiratory failure (HCC) 03/01/2018   Anemia    Arthritis    all over; mostly knees and back (02/28/2018)   Chronic combined systolic and diastolic CHF (congestive heart failure) (HCC)    Chronic lower back pain    stenosis   Community acquired pneumonia 09/06/2013   COPD (chronic obstructive pulmonary disease) (HCC)    Coronary atherosclerosis of native coronary artery    a. 02/2003 s/p CABG x 2 (VG->RI, VG->RPDA; b. 11/2019 PCI: LM nl, LAD 90d, D3 50, RI 100, LCX 100p, OM3 100 - fills via L->L collats from D2/dLAD, RCA 100p, VG->RPDA ok, VG->RI 95 (3.5x48 Synergy XD DES).   Drug abuse (HCC)    hx; tested for cocaine as recently as 2/08. says he is not using drugs now - avoided defib. for this reason    ESRD (end stage renal disease) (HCC)    [facility] M-W-F- [facility]   Fall at home 10/2020   GERD (gastroesophageal reflux disease)    takes OTC meds as needed   GI bleeding    a. 11/2019 EGD: angiodysplastic lesions w/ bleeding s/p argon plasma/clipping/epi inj. Multiple admissions for the same.   Glaucoma    uses eye drops daily    Hepatitis B 1968   tx'd w/isolation; caught it from toilet stools in gym   History of blood transfusion 03/01/2019   History of colon polyps    benign   History of gout    takes Allopurinol  daily as well as Colchicine -if needed (02/28/2018)   History of kidney stones    HTN (hypertension)    takes Coreg ,Imdur .and Apresoline  daily   Human immunodeficiency virus (HIV) disease (HCC) dx'd 1995   on Biktarvy  as of 12/2020.     Hyperlipidemia    Ischemic cardiomyopathy    a. 01/2019 Echo: EF 40-45%, diffuse HK, mild basal septal hypertrophy. Diast dysfxn. Nl RV size/fxn. Sev dil LA. Triv MR/TR/PR.   Muscle spasm    takes Zanaflex  as needed   Myocardial infarction (HCC) ~ 2004/2005   Nocturia    Peripheral neuropathy    takes gabapentin  daily   Pneumonia    at least twice (02/28/2018)   SDH (subdural hematoma) (HCC)    Syphilis, unspecified    Type II diabetes mellitus (HCC) 2004   Lantus  daily.Average fasting blood sugar 125-199   Wears glasses    Wears partial dentures     Past Surgical History:  Procedure Laterality Date   A/V FISTULAGRAM N/A 12/07/2023   Procedure: A/V Fistulagram;  Surgeon: Norine Manuelita LABOR, MD;  [facility]: [facility] INV[facility] LAB;  Service: Cardiovascular;  Laterality: N/A;   AV FISTULA PLACEMENT Left 08/02/2018   Procedure: ARTERIOVENOUS (AV) FISTULA CREATION  left arm radiocephlic;  Surgeon: Gretta Lonni PARAS, MD;  Location: [facility] OR;  Service: Vascular;  Laterality: Left;   AV FISTULA PLACEMENT Left 08/01/2019   Procedure: LEFT BRACHIOCEPHALIC ARTERIOVENOUS (AV) FISTULA CREATION;  Surgeon: Oris Krystal FALCON, MD;  Location: [facility] OR;  Service: Vascular;  Laterality: Left;   AV FISTULA PLACEMENT Right 04/12/2022   Procedure: RIGHT UPPER EXTREMITY ARTERIOVENOUS (AV)  GRAFT INSERTION USING GORE STRETCH 4-7 MM;  Surgeon: Eliza Lonni RAMAN, MD;  Location: [facility] OR;  Service: Vascular;  Laterality: Right;   BASCILIC VEIN TRANSPOSITION Left 10/03/2019   Procedure: BASILIC VEIN  TRANSPOSITION LEFT SECOND STAGE;  Surgeon: Oris Krystal FALCON, MD;  Location: [facility] OR;  Service: Vascular;  Laterality: Left;   BIOPSY  01/25/2021   Procedure: BIOPSY;  Surgeon: Legrand Victory LITTIE DOUGLAS, MD;  Location: [facility] ENDOSCOPY;  Service: Gastroenterology;;   CARDIAC CATHETERIZATION  10/2002; 12/19/2004   thelbert 03/08/2011   COLONOSCOPY  2013   Dr.John Abran    CORONARY ARTERY BYPASS GRAFT  02/24/2003   CABG X2/notes 03/08/2011   CORONARY STENT INTERVENTION N/A 12/19/2019   Procedure: CORONARY STENT INTERVENTION;  Surgeon: Dann Candyce RAMAN, MD;  [facility];  Service: Cardiovascular;  Laterality: N/A;   CORONARY ULTRASOUND/IVUS N/A 12/19/2019   Procedure: Intravascular Ultrasound/IVUS;  Surgeon: Dann Candyce RAMAN, MD;  Location: [facility] INVASIVE CV LAB;  Service: Cardiovascular;  Laterality: N/A;   ENTEROSCOPY N/A 01/25/2021   Procedure: ENTEROSCOPY;  Surgeon: Legrand Victory LITTIE DOUGLAS, MD;  Location: [facility] ENDOSCOPY;  Service: Gastroenterology;  Laterality: N/A;   ENTEROSCOPY N/A 02/13/2021   Procedure: ENTEROSCOPY;  Surgeon: Charlanne Groom, MD;  Location: [facility] ENDOSCOPY;  Service: Endoscopy;  Laterality: N/A;   ENTEROSCOPY N/A 05/07/2021   Procedure: ENTEROSCOPY;  Surgeon: Leigh Elspeth SQUIBB, MD;  Location: [facility] ENDOSCOPY;  Service: Gastroenterology;  Laterality: N/A;   ESOPHAGOGASTRODUODENOSCOPY (EGD) WITH PROPOFOL  N/A 02/08/2019   Procedure: ESOPHAGOGASTRODUODENOSCOPY (EGD) WITH PROPOFOL ;  Surgeon: Teressa Toribio SQUIBB, MD;  Location: [facility] ENDOSCOPY;  Service: Gastroenterology;  Laterality: N/A;   ESOPHAGOGASTRODUODENOSCOPY (EGD) WITH PROPOFOL  N/A 12/22/2019   Procedure: ESOPHAGOGASTRODUODENOSCOPY (EGD) WITH PROPOFOL ;  Surgeon: [name] Sandor GAILS, DO;  [facility] ENDOSCOPY;  Service: Gastroenterology;  Laterality: N/A;   ESOPHAGOGASTRODUODENOSCOPY (EGD) WITH PROPOFOL  N/A 10/19/2020   Procedure: ESOPHAGOGASTRODUODENOSCOPY (EGD) WITH PROPOFOL ;  Surgeon: Charlanne Groom, MD;  Location: [facility] ENDOSCOPY;  Service:  Endoscopy;  Laterality: N/A;   ESOPHAGOGASTRODUODENOSCOPY (EGD) WITH PROPOFOL  N/A 12/22/2020   Procedure: ESOPHAGOGASTRODUODENOSCOPY (EGD) WITH PROPOFOL ;  Surgeon: Avram Lupita BRAVO, MD;  Location: [facility] ENDOSCOPY;  Service: Endoscopy;  Laterality: N/A;   ESOPHAGOGASTRODUODENOSCOPY (EGD) WITH PROPOFOL  N/A 01/09/2021   Procedure: ESOPHAGOGASTRODUODENOSCOPY (EGD) WITH PROPOFOL ;  Surgeon: Abran Norleen SAILOR, MD;  Location: [facility] ENDOSCOPY;  Service: Endoscopy;  Laterality: N/A;   HEMOSTASIS CLIP PLACEMENT  12/22/2019   Procedure: HEMOSTASIS CLIP PLACEMENT;  Surgeon: [name] Sandor GAILS, DO;  Location: [facility] ENDOSCOPY;  Service: Gastroenterology;;   HEMOSTASIS CLIP PLACEMENT  12/22/2020   Procedure: HEMOSTASIS CLIP PLACEMENT;  Surgeon: Avram Lupita BRAVO, MD;  Location: [facility] ENDOSCOPY;  Service: Endoscopy;;   HEMOSTASIS CONTROL  12/22/2020   Procedure: HEMOSTASIS CONTROL/hemospray;  Surgeon: Avram Lupita BRAVO, MD;  Location: [facility] ENDOSCOPY;  Service: Endoscopy;;   HOT HEMOSTASIS N/A 02/08/2019   Procedure: HOT HEMOSTASIS (ARGON PLASMA COAGULATION/BICAP);  Surgeon: Teressa Toribio SQUIBB, MD;  Location: [name] ENDOSCOPY;  Service: Gastroenterology;  Laterality: N/A;   HOT HEMOSTASIS N/A 12/22/2019  Procedure: HOT HEMOSTASIS (ARGON PLASMA COAGULATION/BICAP);  Surgeon: [name] Sandor GAILS, DO;  Location: [facility] ENDOSCOPY;  Service: Gastroenterology;  Laterality: N/A;   HOT HEMOSTASIS N/A 10/19/2020   Procedure: HOT HEMOSTASIS (ARGON PLASMA COAGULATION/BICAP);  Surgeon: Charlanne Groom, MD;  Location: [facility] ENDOSCOPY;  Service: Endoscopy;  Laterality: N/A;   HOT HEMOSTASIS N/A 12/22/2020   Procedure: HOT HEMOSTASIS (ARGON PLASMA COAGULATION/BICAP);  Surgeon: Avram Lupita BRAVO, MD;  Location: [facility] ENDOSCOPY;  Service: Endoscopy;  Laterality: N/A;   HOT HEMOSTASIS N/A 01/09/2021   Procedure: HOT HEMOSTASIS (ARGON PLASMA COAGULATION/BICAP);  Surgeon: Abran Norleen SAILOR, MD;  Location: [facility] ENDOSCOPY;  Service: Endoscopy;  Laterality: N/A;   HOT HEMOSTASIS N/A 01/25/2021    Procedure: HOT HEMOSTASIS (ARGON PLASMA COAGULATION/BICAP);  Surgeon: Legrand Victory LITTIE DOUGLAS, MD;  Location: [facility] ENDOSCOPY;  Service: Gastroenterology;  Laterality: N/A;   HOT HEMOSTASIS N/A 02/13/2021   Procedure: HOT HEMOSTASIS (ARGON PLASMA COAGULATION/BICAP);  Surgeon: Charlanne Groom, MD;  Location: [facility] ENDOSCOPY;  Service: Endoscopy;  Laterality: N/A;   HOT HEMOSTASIS N/A 05/07/2021   Procedure: HOT HEMOSTASIS (ARGON PLASMA COAGULATION/BICAP);  Surgeon: Leigh Elspeth SQUIBB, MD;  Location: [facility] ENDOSCOPY;  Service: Gastroenterology;  Laterality: N/A;   INSERTION OF DIALYSIS CATHETER N/A 02/08/2022   Procedure: INSERTION OF TUNNELED DIALYSIS CATHETER;  Surgeon: Eliza Lonni RAMAN, MD;  Location: [facility] OR;  Service: Vascular;  Laterality: N/A;   INTERTROCHANTERIC HIP FRACTURE SURGERY Left 11/2006   thelbert 03/08/2011   IR FLUORO GUIDE CV LINE LEFT  03/14/2022   IR FLUORO GUIDE CV LINE RIGHT  07/24/2019   IR FLUORO GUIDE CV LINE RIGHT  07/30/2019   IR US  GUIDE VASC ACCESS RIGHT  07/24/2019   IR US  GUIDE VASC ACCESS RIGHT  07/30/2019   LAPAROSCOPIC CHOLECYSTECTOMY  05/2006   LIGATION OF ARTERIOVENOUS  FISTULA Left 02/08/2022   Procedure: LIGATION OF LEFT ARTERIOVENOUS  FISTULA;  Surgeon: Eliza Lonni RAMAN, MD;  Location: [facility] OR;  Service: Vascular;  Laterality: Left;   LIGATION OF COMPETING BRANCHES OF ARTERIOVENOUS FISTULA Left 11/05/2018   Procedure: LIGATION OF COMPETING BRANCHES OF ARTERIOVENOUS FISTULA  LEFT  ARM;  Surgeon: Gretta Lonni PARAS, MD;  Location: [facility] OR;  Service: Vascular;  Laterality: Left;   LUMBAR LAMINECTOMY/DECOMPRESSION MICRODISCECTOMY N/A 02/29/2016   Procedure: Left L4-5 Lateral Recess Decompression, Removal Extradural Intraspinal Facet Cyst;  Surgeon: Oneil JAYSON Herald, MD;  Location: [facility] OR;  Service: Orthopedics;  Laterality: N/A;   METATARSAL HEAD EXCISION Right 07/20/2022   Procedure: Irragation and debridement of right foot wound with extraction of all necrotic soft tissue and bone;   Surgeon: Malvin Marsa FALCON, DPM;  Location: [facility] OR;  Service: Podiatry;  Laterality: Right;  Right 4th Met head and base of proximal phalanx resection   MULTIPLE TOOTH EXTRACTIONS     ORIF MANDIBULAR FRACTURE Left 08/13/2004   ORIF of left body fracture mandible with KLS Martin 2.3-mm six hole/notes 03/08/2011   RIGHT/LEFT HEART CATH AND CORONARY/GRAFT ANGIOGRAPHY N/A 12/19/2019   Procedure: RIGHT/LEFT HEART CATH AND CORONARY/GRAFT ANGIOGRAPHY;  Surgeon: Dann Candyce RAMAN, MD;  Location: [facility] INVASIVE CV LAB;  Service: Cardiovascular;  Laterality: N/A;   SCLEROTHERAPY  12/22/2019   Procedure: SCLEROTHERAPY;  Surgeon: [name] Sandor GAILS, DO;  Location: [facility] ENDOSCOPY;  Service: Gastroenterology;;   MATIAS  02/13/2021   Procedure: MATIAS;  Surgeon: Charlanne Groom, MD;  Location: [facility] ENDOSCOPY;  Service: Endoscopy;;    Allergies  Allergen Reactions   Augmentin  [Amoxicillin -Pot Clavulanate] Diarrhea and Other (See Comments)    Severe diarrhea  Morphine  Sulfate Anaphylaxis    Can't mix with oxycodone     Mucinex  Fast-Max Other (See Comments)    Intense sweating    Amphetamines Other (See Comments)    Unknown reaction    Prior to Admission medications   Medication Sig Start Date End Date Taking? Authorizing Provider  acetaminophen  (TYLENOL ) 650 MG CR tablet Take 1,300 mg by mouth 3 (three) times daily.    [provider]  albuterol  (PROVENTIL ) (2.5 MG/3ML) 0.083% nebulizer solution Take 3 mLs (2.5 mg total) by nebulization every 6 (six) hours as needed for wheezing or shortness of breath. 06/20/23 06/19/24  [name], Dinah C, NP  allopurinol  (ZYLOPRIM ) 100 MG tablet TAKE 1 TABLET BY MOUTH DAILY 11/01/23   Sherlynn Madden, MD  atorvastatin  (LIPITOR ) 10 MG tablet TAKE 1 TABLET(10 MG) BY MOUTH DAILY. 11/23/23   Wyn Jackee VEAR Mickey., NP  B-D UF III MINI PEN NEEDLES 31G X 5 MM MISC Inject 1 Box into the skin 4 (four) times daily. 04/15/24   de [place], Raymond J, MD  BIKTARVY   [phone] MG TABS tablet TAKE 1 TABLET BY MOUTH 1 TIME A DAY 01/04/24   Efrain Lamar ORN, MD  Calcium  Acetate 667 MG TABS Take 667-2,001 mg by mouth See admin instructions. Take 2001 mg by mouth three times a day with meals and 667 mg with each snack 01/20/21   [provider]  carvedilol  (COREG ) 3.125 MG tablet TAKE 1 TABLET(3.125 MG) BY MOUTH TWICE DAILY WITH A MEAL 11/28/23   Dick, Ernest H Jr., NP  diclofenac  Sodium (VOLTAREN ) 1 % GEL Apply 2 grams topically to the affected area three times daily as needed for pain. 12/19/22   Cleotilde Garnette HERO, MD  folic acid  (FOLVITE ) 1 MG tablet Take 1 mg by mouth daily.    [provider]  gabapentin  (NEURONTIN ) 300 MG capsule Take 1 capsule (300 mg total) by mouth in the morning and at bedtime. Patient is taking 1 tablet in the early evening after dialysis and 1 tablet at bedtime 03/01/24   de [place], Quintin PARAS, MD  HUMALOG  KWIKPEN 100 UNIT/ML KwikPen Inject 15 Units into the skin 3 (three) times daily. 01/02/24   Sherlynn Madden, MD  insulin  degludec (TRESIBA ) 100 UNIT/ML FlexTouch Pen Inject 15 Units into the skin daily. 3 01/02/24   Sherlynn Madden, MD  isosorbide  mononitrate (IMDUR ) 30 MG 24 hr tablet Take 1 tablet (30 mg total) by mouth daily. 04/12/24   de [place], Raymond J, MD  latanoprost  (XALATAN ) 0.005 % ophthalmic solution Place 1 drop into both eyes at bedtime.    [provider]  leptospermum manuka honey (MEDIHONEY) PSTE paste Apply 1 Application topically daily. Cleanse R lower leg wound with Vashe wound cleanser Soila [phone]), do not rinse and allow to air dry. Apply Medihoney to wound bed daily, cover with dry gauze and cover with silicone foam or ABD pad and Kerlix roll gauze to secure.Interchangeable with TheraHoney Apply thin layer (3 mm) to wound. 01/25/24   Hongalgi, Anand D, MD  lidocaine -prilocaine  (EMLA ) cream Apply topically as directed. At dialysis 10/30/23   [provider]  Menthol -Camphor (TIGER BALM  ARTHRITIS RUB) 11-11 % CREA Apply 1 Application topically as needed (pain).    [provider]  midodrine  (PROAMATINE ) 10 MG tablet Take 10 mg by mouth 3 (three) times a week. 03/21/23   [provider]  multivitamin (RENA-VIT) TABS tablet Take 1 tablet by mouth daily.    [provider]  neomycin-bacitracin-polymyxin [phone] OINT  Apply 1 Application topically in the morning and at bedtime. 12/26/23   Sherlynn Madden, MD  nitroGLYCERIN  (NITROSTAT ) 0.3 MG SL tablet Place 1 tablet (0.3 mg total) under the tongue every 5 (five) minutes as needed for chest pain. 02/27/24   Delford Maude BROCKS, MD  octreotide  (SANDOSTATIN  LAR DEPOT) 10 MG injection Inject 10 mg into the muscle every 28 days. 03/21/24   McMichael, Nestor HERO, PA-C  oxyCODONE -acetaminophen  (PERCOCET) 5-325 MG tablet Take 1 tablet by mouth every 8 (eight) hours as needed for severe pain (pain score 7-10). 04/12/24   Urbano Albright, MD  polyethylene glycol (MIRALAX  / GLYCOLAX ) 17 g packet Take 17 g by mouth daily as needed for moderate constipation.    [provider]  PREVIDENT 5000 PLUS 1.1 % CREA dental cream Place 1 Application onto teeth at bedtime. 05/25/23   [provider]  simethicone  (MYLICON) 125 MG chewable tablet Chew 125 mg by mouth as needed for flatulence.    [provider]  timolol  (TIMOPTIC ) 0.5 % ophthalmic solution Place 1 drop into both eyes in the morning. 04/04/19   [provider]  umeclidinium-vilanterol (ANORO ELLIPTA ) 62.5-25 MCG/ACT AEPB Inhale 1 puff into the lungs daily. 06/07/23   Cleotilde Garnette HERO, MD    Social History   Socioeconomic History   Marital status: Single    Spouse name: Not on file   Number of children: 1   Years of education: Not on file   Highest education level: Not on file  Occupational History   Occupation: retired  Tobacco Use   Smoking status: Every Day    Current packs/day: 0.50    Average packs/day: 0.5 packs/day for  43.0 years (21.5 ttl pk-yrs)    Types: Cigarettes    Passive exposure: Current   Smokeless tobacco: Never  Vaping Use   Vaping status: Never Used  Substance and Sexual Activity   Alcohol use: Not Currently    Alcohol/week: 12.0 standard drinks of alcohol    Types: 12 Standard drinks or equivalent per week    Comment: occassional   Drug use: Not Currently    Types: Cocaine    Comment: hx of crack/cocaine 51yrs ago 10/01/2019- none   Sexual activity: Yes    Partners: Male    Comment: Declined condoms  Other Topics Concern   Not on file  Social History Narrative   Retired.       As of 05/2015:   Diet: No salt    Caffeine    Married: Single    House: Condo, 2 stories, 1 person (self)    Pets: No    Current/Past profession: N/A   Exercise: walks daily    Living Will: Yes    DNR   POA/HPOA: No       Social Drivers of Corporate investment banker Strain: Low Risk  (10/26/2020)   Overall Financial Resource Strain (CARDIA)    Difficulty of Paying Living Expenses: Not hard at all  Food Insecurity: No Food Insecurity (02/27/2024)   Hunger Vital Sign    Worried About Running Out of Food in the Last Year: Never true    Ran Out of Food in the Last Year: Never true  Transportation Needs: No Transportation Needs (02/27/2024)   PRAPARE - Administrator, Civil Service (Medical): No    Lack of Transportation (Non-Medical): No  Physical Activity: Insufficiently Active (06/26/2018)   Exercise Vital Sign    Days of Exercise per Week: 7 days  Minutes of Exercise per Session: 20 min  Stress: No Stress Concern Present (02/27/2024)   [employer] of Occupational Health - Occupational Stress Questionnaire    Feeling of Stress : Not at all  Social Connections: Somewhat Isolated (06/26/2018)   Social Connection and Isolation Panel    Frequency of Communication with Friends and Family: Three times a week    Frequency of Social Gatherings with Friends and Family: Three times a week     Attends Religious Services: More than 4 times per year    Active Member of Clubs or Organizations: No    Attends Banker Meetings: Never    Marital Status: Never married  Intimate Partner Violence: Not At Risk (02/27/2024)   Humiliation, Afraid, Rape, and Kick questionnaire    Fear of Current or Ex-Partner: No    Emotionally Abused: No    Physically Abused: No    Sexually Abused: No     Family History  Problem Relation Age of Onset   Heart failure Father    Hypertension Father    Diabetes Brother    Heart attack Brother    Alzheimer's disease Mother    Stroke Sister    Diabetes Sister    Alzheimer's disease Sister    Hypertension Brother    Diabetes Brother    Drug abuse Brother    Colon cancer Neg Hx     ROS: [x]  Positive   [ ]  Negative   [ ]  All sytems reviewed and are negative  Cardiovascular: []  chest pain/pressure []  palpitations []  SOB lying flat []  DOE []  pain in legs while walking []  pain in legs at rest []  pain in legs at night []  non-healing ulcers []  hx of DVT []  swelling in legs  Pulmonary: []  productive cough []  asthma/wheezing []  home O2  Neurologic: []  weakness in []  arms []  legs []  numbness in []  arms []  legs []  hx of CVA []  mini stroke [] difficulty speaking or slurred speech []  temporary loss of vision in one eye []  dizziness  Hematologic: []  hx of cancer []  bleeding problems []  problems with blood clotting easily  Endocrine:   []  diabetes []  thyroid  disease  GI []  vomiting blood []  blood in stool  GU: []  CKD/renal failure []  HD--[]  M/W/F or []  T/T/S []  burning with urination []  blood in urine  Psychiatric: []  anxiety []  depression  Musculoskeletal: []  arthritis []  joint pain  Integumentary: []  rashes []  ulcers  Constitutional: []  fever []  chills   Physical Examination  Vitals:   04/18/24 0823 04/18/24 0835  BP:  (!) 100/57  Pulse:  73  Resp:  13  Temp: 97.9 F (36.6 C)   SpO2: 96% 96%    Body mass index is 22.47 kg/m.  General:  WDWN in NAD Gait: Not observed HENT: WNL, normocephalic Pulmonary: normal non-labored breathing Cardiac: regular, without  Murmurs, rubs or gallops Abdomen:  soft, NT/ND Vascular Exam/Pulses: Bilateral femoral pulses palpable No palpable pedal pulses Right calf wound noted Musculoskeletal: no muscle wasting or atrophy  Neurologic: A&O X 3; Appropriate Affect ; SENSATION: normal; MOTOR FUNCTION:  moving all extremities equally. Speech is fluent/normal   CBC    Component Value Date/Time   WBC 7.3 04/18/2024 0544   RBC 3.52 (L) 04/18/2024 0544   HGB 11.6 (L) 04/18/2024 0544   HGB 9.3 (L) 08/09/2022 1625   HCT 36.3 (L) 04/18/2024 0544   HCT 28.6 (L) 08/09/2022 1625   PLT 141 (L) 04/18/2024 0544   PLT 129 (  L) 08/09/2022 1625   MCV 103.1 (H) 04/18/2024 0544   MCV 104 (H) 08/09/2022 1625   MCH 33.0 04/18/2024 0544   MCHC 32.0 04/18/2024 0544   RDW 15.1 04/18/2024 0544   RDW 14.4 08/09/2022 1625   LYMPHSABS 2.1 04/17/2024 1615   LYMPHSABS 0.9 12/17/2019 1042   MONOABS 1.0 04/17/2024 1615   EOSABS 0.1 04/17/2024 1615   EOSABS 0.1 12/17/2019 1042   BASOSABS 0.1 04/17/2024 1615   BASOSABS 0.0 12/17/2019 1042    BMET    Component Value Date/Time   NA 136 04/18/2024 0544   NA 135 (A) 02/02/2023 0000   K 4.4 04/18/2024 0544   CL 94 (L) 04/18/2024 0544   CO2 25 04/18/2024 0544   GLUCOSE 132 (H) 04/18/2024 0544   BUN 25 (H) 04/18/2024 0544   BUN 59 (A) 02/02/2023 0000   CREATININE 7.61 (H) 04/18/2024 0544   CREATININE 6.53 (H) 02/24/2022 0927   CALCIUM  8.6 (L) 04/18/2024 0544   GFRNONAA 7 (L) 04/18/2024 0544   GFRNONAA 10 (L) 01/12/2021 1139   GFRAA 12 (L) 01/12/2021 1139    COAGS: Lab Results  Component Value Date   INR 1.1 09/30/2022   INR 1.1 01/21/2022   INR 1.0 05/05/2021     Non-Invasive Vascular Imaging:    Lower extremity arterial duplex 03/14/2024 showed a mild 30 to 49% stenosis in the SFA with heavily  calcified tibial vessels.  ABIs on the right were dampened monophasic and unable to tolerate cuff.  ASSESSMENT/PLAN: This is a 73 y.o. male with history of ESRD, HIV, hepatitis C, peripheral vascular disease, COPD, systolic CHF with an EF of 40-45%, diabetes, coronary artery disease that vascular surgery been consulted for right lower extremity angiogram due to tissue loss.  Patient has been evaluated by Dr. Court and has had a right calf wound.  Seen in the ED yesterday by cardiology.  Vascular asked to see if we could schedule his angiogram today.  Plan for aortogram with lower extremity arteriogram with a focus on the right lower extremity given chronic wound.  Possible endovascular invention including angioplasty and/or stent.  Like underlying tibial disease given his risk factors.  Risk-benefits discussed.  Scheduled in the cath lab today.  Lonni DOROTHA Gaskins, MD [facility] Office: [phone]  Lonni JINNY Gaskins

## 2024-04-18 NOTE — Progress Notes (Signed)
 Site area: L femoral (arterial) Site Prior to Removal:  Level 0 Pressure Applied For: 30 min Manual:   yes Patient Status During Pull:  stable Post Pull Site:  Level 0 Post Pull Instructions Given:  yes, pt not receptive Post Pull Pulses Present: bilateral DP by doppler Dressing Applied:  gauze & tegaderm Bedrest begins @ 1810 Comments:

## 2024-04-19 ENCOUNTER — Inpatient Hospital Stay (HOSPITAL_COMMUNITY)

## 2024-04-19 ENCOUNTER — Encounter (HOSPITAL_COMMUNITY): Payer: Self-pay | Admitting: Vascular Surgery

## 2024-04-19 ENCOUNTER — Other Ambulatory Visit (HOSPITAL_COMMUNITY)

## 2024-04-19 DIAGNOSIS — Z992 Dependence on renal dialysis: Secondary | ICD-10-CM

## 2024-04-19 DIAGNOSIS — I132 Hypertensive heart and chronic kidney disease with heart failure and with stage 5 chronic kidney disease, or end stage renal disease: Secondary | ICD-10-CM

## 2024-04-19 DIAGNOSIS — I509 Heart failure, unspecified: Secondary | ICD-10-CM

## 2024-04-19 DIAGNOSIS — N186 End stage renal disease: Secondary | ICD-10-CM

## 2024-04-19 DIAGNOSIS — E1122 Type 2 diabetes mellitus with diabetic chronic kidney disease: Secondary | ICD-10-CM

## 2024-04-19 DIAGNOSIS — I639 Cerebral infarction, unspecified: Secondary | ICD-10-CM | POA: Insufficient documentation

## 2024-04-19 DIAGNOSIS — G9340 Encephalopathy, unspecified: Secondary | ICD-10-CM

## 2024-04-19 DIAGNOSIS — S81801A Unspecified open wound, right lower leg, initial encounter: Secondary | ICD-10-CM | POA: Diagnosis not present

## 2024-04-19 DIAGNOSIS — E440 Moderate protein-calorie malnutrition: Secondary | ICD-10-CM | POA: Insufficient documentation

## 2024-04-19 DIAGNOSIS — R29703 NIHSS score 3: Secondary | ICD-10-CM

## 2024-04-19 DIAGNOSIS — E1151 Type 2 diabetes mellitus with diabetic peripheral angiopathy without gangrene: Secondary | ICD-10-CM

## 2024-04-19 LAB — CBC
HCT: 39.4 % (ref 39.0–52.0)
Hemoglobin: 12.6 g/dL — ABNORMAL LOW (ref 13.0–17.0)
MCH: 33.1 pg (ref 26.0–34.0)
MCHC: 32 g/dL (ref 30.0–36.0)
MCV: 103.4 fL — ABNORMAL HIGH (ref 80.0–100.0)
Platelets: 145 10*3/uL — ABNORMAL LOW (ref 150–400)
RBC: 3.81 MIL/uL — ABNORMAL LOW (ref 4.22–5.81)
RDW: 15.1 % (ref 11.5–15.5)
WBC: 8.2 10*3/uL (ref 4.0–10.5)
nRBC: 0 % (ref 0.0–0.2)

## 2024-04-19 LAB — BASIC METABOLIC PANEL WITH GFR
Anion gap: 18 — ABNORMAL HIGH (ref 5–15)
BUN: 25 mg/dL — ABNORMAL HIGH (ref 8–23)
CO2: 23 mmol/L (ref 22–32)
Calcium: 9.1 mg/dL (ref 8.9–10.3)
Chloride: 94 mmol/L — ABNORMAL LOW (ref 98–111)
Creatinine, Ser: 6.67 mg/dL — ABNORMAL HIGH (ref 0.61–1.24)
GFR, Estimated: 8 mL/min — ABNORMAL LOW (ref >60)
Glucose, Bld: 170 mg/dL — ABNORMAL HIGH (ref 70–99)
Potassium: 5.1 mmol/L (ref 3.5–5.1)
Sodium: 135 mmol/L (ref 135–145)

## 2024-04-19 LAB — GLUCOSE, CAPILLARY: Glucose-Capillary: 214 mg/dL — ABNORMAL HIGH (ref 70–99)

## 2024-04-19 LAB — HEPATITIS B SURFACE ANTIBODY, QUANTITATIVE: Hep B S AB Quant (Post): 162 m[IU]/mL

## 2024-04-19 LAB — TSH: TSH: 0.656 u[IU]/mL (ref 0.350–4.500)

## 2024-04-19 LAB — VITAMIN B12: Vitamin B-12: 598 pg/mL (ref 180–914)

## 2024-04-19 LAB — AMMONIA: Ammonia: 25 umol/L (ref 9–35)

## 2024-04-19 MED ORDER — RENA-VITE PO TABS
1.0000 | ORAL_TABLET | Freq: Every day | ORAL | Status: DC
  Administered 2024-04-19 – 2024-04-26 (×7): 1 via ORAL
  Filled 2024-04-19 (×9): qty 1

## 2024-04-19 MED ORDER — ENSURE PLUS HIGH PROTEIN PO LIQD
237.0000 mL | Freq: Two times a day (BID) | ORAL | Status: DC
  Administered 2024-04-20 – 2024-04-27 (×9): 237 mL via ORAL

## 2024-04-19 MED ORDER — LORAZEPAM 1 MG PO TABS
1.0000 mg | ORAL_TABLET | Freq: Once | ORAL | Status: AC | PRN
  Administered 2024-04-20: 1 mg via ORAL

## 2024-04-19 MED ORDER — CHLORHEXIDINE GLUCONATE CLOTH 2 % EX PADS
6.0000 | MEDICATED_PAD | Freq: Every day | CUTANEOUS | Status: DC
  Administered 2024-04-21 – 2024-04-27 (×5): 6 via TOPICAL

## 2024-04-19 MED ORDER — STROKE: EARLY STAGES OF RECOVERY BOOK
Freq: Once | Status: AC
  Filled 2024-04-19: qty 1

## 2024-04-19 MED ORDER — JUVEN PO PACK
1.0000 | PACK | Freq: Two times a day (BID) | ORAL | Status: DC
  Administered 2024-04-20 – 2024-04-27 (×9): 1 via ORAL
  Filled 2024-04-19 (×9): qty 1

## 2024-04-19 NOTE — Progress Notes (Addendum)
  Progress Note   Patient: Jacob [name] FMW:[id number] DOB: 1951/04/11 DOA: 04/17/2024     2 DOS: the patient was seen and examined on 04/19/2024        Brief hospital course: 73 y.o. M with ESRD on HD MWF, HIV, HTN, sCHF EF 40-45%, hx apical aneurysm, DM, CAD s/p CABG 2004 last PCI 2021, hep C, hx GIB due to AVMs, still smoking, PVD of RLE, chronic RLE wound who presented with nonhealing right medial calf wound.    In the ER, patient was confused.  Cardiology evaluated the patient, recommended vascular surgery consult for angiogram tomorrow.        Assessment and Plan: * Ischemic leg wound Peripheral vascular disease S/p Angioplasty 6/26 by Dr. Gretta Admitted and Cardiology/Vascular surgery consulted.  Vascular surgery took the patient to angiography 6/26 and performed angioplasty. - WOC consult - Aspirin  and Lipitor     Acute metabolic encephalopathy Was very confused on presentation, which was inconsistent with prior reports.  TSH, ammonia, B12, LFTs all normal.   Was outside the window for tPA at the time of presentation. - Obtain MRI brain  ADDENDUM:  Acute embolic appearing strokes.  ? If this is related to LV aneurysm.  Complicating factor is history of AVMs. MRI brain showed scattered subacute infarcts in left midbrain and right frontoparietal white matter, query central embolic versus concurrent small vessel - Non-invasive angiography defer to Neuro - Repeat echo ordered - Check lipids - Continue aspirin  - PT eval ordered   ESRD (end stage renal disease) (HCC) - Consult nephrology for HD  Human immunodeficiency virus (HIV) disease (HCC) - Continue Biktarvy   Chronic systolic CHF (congestive heart failure) (HCC) - Hold carvedilol   - Volume status per HD  DM (diabetes mellitus) (HCC) Glucose controlled - Hold Tresiba  given until diet started - Continue sliding scale corrections  Coronary artery disease with angina Hypertension - Continue Lipitor  (had  been stopped outpatient due to muscle aches) - Check CK - Hold carvedilol  and Imdur  due to soft BP            Subjective: Had cath yestrday with angioplasty.  He is sluggish and having difficulty articulating his symptoms.     Physical Exam: BP 128/75 (BP Location: Left Arm)   Pulse 94   Temp 98.2 F (36.8 C) (Oral)   Resp 16   Ht 6' 2 (1.88 m)   Wt 79.4 kg   SpO2 99%   BMI 22.47 kg/m   Elderly adult male, sedated and sluggish RRR, no murmurs, unchanged pitting edema in the right shin Respiratory rate slow, lungs diminished, I do not appreciate rales or wheezes Abdomen without focal tenderness to palpation Attention diminished, still confused but more alert today, able to reorient to hospital, oriented to Month.  Answers still inappropriate and seems to have severe short term memory impairment and sleepiness, but improved.    Data Reviewed: BMP c/w ESRD CBC normal      Family Communication: Called to niece, no answer, left VM    Disposition: Status is: Inpatient         Author: Lonni SHAUNNA Dalton, MD 04/19/2024 4:03 PM  For on call review [email or web address].

## 2024-04-19 NOTE — Consult Note (Signed)
 WOC Nurse Consult Note: patient has been followed at wound care center for R calf wound of unknown etiology since April 2025, last seen there 04/16/2024 and ordered Vashe wet to dry dressings  Reason for Consult: RLE wound  Wound type: full thickness of unknown etiology  Pressure Injury POA: NA not related to pressure  Measurement: see nursing flowsheet, per last [facility] note 3 cm x 3.8 cm x 0.1 cm  Wound bed: appears largely clean with some tan fibrinous tissue present  Drainage (amount, consistency, odor)  per nursing flowsheet  Periwound: intact  Dressing procedure/placement/frequency: Cleanse R lower leg wound with Vashe wound cleanser Soila [phone]) do not rinse and allow to air dry. Apply Vashe moistened gauze to wound bed daily, cover with dry gauze/ABD pad and secure with Kerlix roll gauze beginning just above toes and ending right below knee.  Apply Ace bandage in same fashion as Kerlix for light compression.    POC discussed with bedside nurse. WOC team will not follow.  Patient should continue ongoing management of wound with wound care center at discharge.  Re-consult if further needs arise.   Thank you,    Powell Bar MSN, RN-BC, [employer]

## 2024-04-19 NOTE — Consult Note (Signed)
 NEUROLOGY CONSULT NOTE   Date of service: April 19, 2024 Patient Name: Jacob [name] MRN:  [id number] DOB:  Sep 21, 1951 Chief Complaint:  Stroke on MRI  Requesting Provider: Jonel Lonni SQUIBB, *  History of Present Illness  Jacob [name] is a 73 y.o. male with hx of  CHF, COPD, CAD, HIV,  ESRD on HD, HTN, HLD, MI, ischemic cardiomyopathy, peripheral neuropathy, DM, hx of SDH, syphilis, gout glaucoma, hx apical aneurysm hep C, hx GIB due to AVMs, still smoking, hx of drug abuse, PVD of RLE, chronic RLE wound who presented to [facility] for nonhealing wound and critical limb ischemia of right lower leg.  He underwent RLE distal SFA and popliteal artery angioplasty.  Due to confusion he had MRI brain which revealed Multiple small acute or early subacute infarcts in the left midbrain and right frontoparietal white matter.  Neurology consulted   LKW: PTA Modified rankin score: 1-No significant post stroke disability and can perform usual duties with stroke symptoms IV Thrombolysis: No outside window  EVT:  No LVO   NIHSS components Score: Comment  1a Level of Conscious 0[x]  1[]  2[]  3[]      1b LOC Questions 0[]  1[x]  2[]       1c LOC Commands 0[x]  1[]  2[]       2 Best Gaze 0[x]  1[]  2[]       3 Visual 0[x]  1[]  2[]  3[]      4 Facial Palsy 0[]  1[x]  2[]  3[]      5a Motor Arm - left 0[x]  1[]  2[]  3[]  4[]  UN[]    5b Motor Arm - Right 0[x]  1[]  2[]  3[]  4[]  UN[]    6a Motor Leg - Left 0[x]  1[]  2[]  3[]  4[]  UN[]    6b Motor Leg - Right 0[x]  1[]  2[]  3[]  4[]  UN[]    7 Limb Ataxia 0[x]  1[]  2[]  UN[]      8 Sensory 0[x]  1[]  2[]  UN[]      9 Best Language 0[]  1[x]  2[]  3[]      10 Dysarthria 0[x]  1[]  2[]  UN[]      11 Extinct. and Inattention 0[x]  1[]  2[]       TOTAL: 3      ROS  Comprehensive ROS performed and pertinent positives documented in HPI    Past History   Past Medical History:  Diagnosis Date   Acute respiratory failure (HCC) 03/01/2018   Anemia    Arthritis    all over; mostly knees and back  (02/28/2018)   Chronic combined systolic and diastolic CHF (congestive heart failure) (HCC)    Chronic lower back pain    stenosis   Community acquired pneumonia 09/06/2013   COPD (chronic obstructive pulmonary disease) (HCC)    Coronary atherosclerosis of native coronary artery    a. 02/2003 s/p CABG x 2 (VG->RI, VG->RPDA; b. 11/2019 PCI: LM nl, LAD 90d, D3 50, RI 100, LCX 100p, OM3 100 - fills via L->L collats from D2/dLAD, RCA 100p, VG->RPDA ok, VG->RI 95 (3.5x48 Synergy XD DES).   Drug abuse (HCC)    hx; tested for cocaine as recently as 2/08. says he is not using drugs now - avoided defib. for this reason    ESRD (end stage renal disease) (HCC)    Hemo M-W-F- [facility]   Fall at home 10/2020   GERD (gastroesophageal reflux disease)    takes OTC meds as needed   GI bleeding    a. 11/2019 EGD: angiodysplastic lesions w/ bleeding s/p argon plasma/clipping/epi inj. Multiple admissions for the same.   Glaucoma  uses eye drops daily   Hepatitis B 1968   tx'd w/isolation; caught it from toilet stools in gym   History of blood transfusion 03/01/2019   History of colon polyps    benign   History of gout    takes Allopurinol  daily as well as Colchicine -if needed (02/28/2018)   History of kidney stones    HTN (hypertension)    takes Coreg ,Imdur .and Apresoline  daily   Human immunodeficiency virus (HIV) disease (HCC) dx'd 1995   on Biktarvy  as of 12/2020.     Hyperlipidemia    Ischemic cardiomyopathy    a. 01/2019 Echo: EF 40-45%, diffuse HK, mild basal septal hypertrophy. Diast dysfxn. Nl RV size/fxn. Sev dil LA. Triv MR/TR/PR.   Muscle spasm    takes Zanaflex  as needed   Myocardial infarction (HCC) ~ 2004/2005   Nocturia    Peripheral neuropathy    takes gabapentin  daily   Pneumonia    at least twice (02/28/2018)   SDH (subdural hematoma) (HCC)    Syphilis, unspecified    Type II diabetes mellitus (HCC) 2004   Lantus  daily.Average fasting blood sugar 125-199   Wears glasses     Wears partial dentures     Past Surgical History:  Procedure Laterality Date   A/V FISTULAGRAM N/A 12/07/2023   Procedure: A/V Fistulagram;  Surgeon: Norine Manuelita LABOR, MD;  [facility]: [facility] INVASIVE CV LAB;  Service: Cardiovascular;  Laterality: N/A;   AV FISTULA PLACEMENT Left 08/02/2018   Procedure: ARTERIOVENOUS (AV) FISTULA CREATION  left arm radiocephlic;  Surgeon: Gretta Lonni PARAS, MD;  Location: [facility] OR;  Service: Vascular;  Laterality: Left;   AV FISTULA PLACEMENT Left 08/01/2019   Procedure: LEFT BRACHIOCEPHALIC ARTERIOVENOUS (AV) FISTULA CREATION;  Surgeon: Oris Krystal FALCON, MD;  Location: [facility] OR;  Service: Vascular;  Laterality: Left;   AV FISTULA PLACEMENT Right 04/12/2022   Procedure: RIGHT UPPER EXTREMITY ARTERIOVENOUS (AV)  GRAFT INSERTION USING GORE STRETCH 4-7 MM;  Surgeon: Eliza Lonni RAMAN, MD;  Location: [facility] OR;  Service: Vascular;  Laterality: Right;   BASCILIC VEIN TRANSPOSITION Left 10/03/2019   Procedure: BASILIC VEIN TRANSPOSITION LEFT SECOND STAGE;  Surgeon: Oris Krystal FALCON, MD;  Location: [facility] OR;  Service: Vascular;  Laterality: Left;   BIOPSY  01/25/2021   Procedure: BIOPSY;  Surgeon: Legrand Victory LITTIE DOUGLAS, MD;  Location: [facility] ENDOSCOPY;  Service: Gastroenterology;;   CARDIAC CATHETERIZATION  10/2002; 12/19/2004   thelbert 03/08/2011   COLONOSCOPY  2013   Dr.John Abran    CORONARY ARTERY BYPASS GRAFT  02/24/2003   CABG X2/notes 03/08/2011   CORONARY STENT INTERVENTION N/A 12/19/2019   Procedure: CORONARY STENT INTERVENTION;  Surgeon: Dann Candyce RAMAN, MD;  [facility];  Service: Cardiovascular;  Laterality: N/A;   CORONARY ULTRASOUND/IVUS N/A 12/19/2019   Procedure: Intravascular Ultrasound/IVUS;  Surgeon: Dann Candyce RAMAN, MD;  Location: [facility] INVASIVE CV LAB;  Service: Cardiovascular;  Laterality: N/A;   ENTEROSCOPY N/A 01/25/2021   Procedure: ENTEROSCOPY;  Surgeon: Legrand Victory LITTIE DOUGLAS, MD;  Location: [facility] ENDOSCOPY;  Service: Gastroenterology;  Laterality: N/A;    ENTEROSCOPY N/A 02/13/2021   Procedure: ENTEROSCOPY;  Surgeon: Charlanne Groom, MD;  Location: [facility] ENDOSCOPY;  Service: Endoscopy;  Laterality: N/A;   ENTEROSCOPY N/A 05/07/2021   Procedure: ENTEROSCOPY;  Surgeon: Leigh Elspeth SQUIBB, MD;  Location: [facility] ENDOSCOPY;  Service: Gastroenterology;  Laterality: N/A;   ESOPHAGOGASTRODUODENOSCOPY (EGD) WITH PROPOFOL  N/A 02/08/2019   Procedure: ESOPHAGOGASTRODUODENOSCOPY (EGD) WITH PROPOFOL ;  Surgeon: Teressa Toribio SQUIBB, MD;  Location: [facility] ENDOSCOPY;  Service: Gastroenterology;  Laterality: N/A;   ESOPHAGOGASTRODUODENOSCOPY (EGD) WITH PROPOFOL  N/A 12/22/2019   Procedure: ESOPHAGOGASTRODUODENOSCOPY (EGD) WITH PROPOFOL ;  Surgeon: [name] Sandor GAILS, DO;  [facility] ENDOSCOPY;  Service: Gastroenterology;  Laterality: N/A;   ESOPHAGOGASTRODUODENOSCOPY (EGD) WITH PROPOFOL  N/A 10/19/2020   Procedure: ESOPHAGOGASTRODUODENOSCOPY (EGD) WITH PROPOFOL ;  Surgeon: Charlanne Groom, MD;  Location: [facility] ENDOSCOPY;  Service: Endoscopy;  Laterality: N/A;   ESOPHAGOGASTRODUODENOSCOPY (EGD) WITH PROPOFOL  N/A 12/22/2020   Procedure: ESOPHAGOGASTRODUODENOSCOPY (EGD) WITH PROPOFOL ;  Surgeon: Avram Lupita BRAVO, MD;  Location: [facility] ENDOSCOPY;  Service: Endoscopy;  Laterality: N/A;   ESOPHAGOGASTRODUODENOSCOPY (EGD) WITH PROPOFOL  N/A 01/09/2021   Procedure: ESOPHAGOGASTRODUODENOSCOPY (EGD) WITH PROPOFOL ;  Surgeon: Abran Norleen SAILOR, MD;  Location: [facility] ENDOSCOPY;  Service: Endoscopy;  Laterality: N/A;   HEMOSTASIS CLIP PLACEMENT  12/22/2019   Procedure: HEMOSTASIS CLIP PLACEMENT;  Surgeon: [name] Sandor GAILS, DO;  Location: [facility] ENDOSCOPY;  Service: Gastroenterology;;   HEMOSTASIS CLIP PLACEMENT  12/22/2020   Procedure: HEMOSTASIS CLIP PLACEMENT;  Surgeon: Avram Lupita BRAVO, MD;  Location: [facility] ENDOSCOPY;  Service: Endoscopy;;   HEMOSTASIS CONTROL  12/22/2020   Procedure: HEMOSTASIS CONTROL/hemospray;  Surgeon: Avram Lupita BRAVO, MD;  Location: [facility] ENDOSCOPY;  Service: Endoscopy;;   HOT HEMOSTASIS N/A 02/08/2019    Procedure: HOT HEMOSTASIS (ARGON PLASMA COAGULATION/BICAP);  Surgeon: Teressa Toribio SQUIBB, MD;  Location: [facility] ENDOSCOPY;  Service: Gastroenterology;  Laterality: N/A;   HOT HEMOSTASIS N/A 12/22/2019   Procedure: HOT HEMOSTASIS (ARGON PLASMA COAGULATION/BICAP);  Surgeon: [name] Sandor GAILS, DO;  Location: [facility] ENDOSCOPY;  Service: Gastroenterology;  Laterality: N/A;   HOT HEMOSTASIS N/A 10/19/2020   Procedure: HOT HEMOSTASIS (ARGON PLASMA COAGULATION/BICAP);  Surgeon: Charlanne Groom, MD;  Location: [facility] ENDOSCOPY;  Service: Endoscopy;  Laterality: N/A;   HOT HEMOSTASIS N/A 12/22/2020   Procedure: HOT HEMOSTASIS (ARGON PLASMA COAGULATION/BICAP);  Surgeon: Avram Lupita BRAVO, MD;  Location: [facility] ENDOSCOPY;  Service: Endoscopy;  Laterality: N/A;   HOT HEMOSTASIS N/A 01/09/2021   Procedure: HOT HEMOSTASIS (ARGON PLASMA COAGULATION/BICAP);  Surgeon: Abran Norleen SAILOR, MD;  Location: [facility] ENDOSCOPY;  Service: Endoscopy;  Laterality: N/A;   HOT HEMOSTASIS N/A 01/25/2021   Procedure: HOT HEMOSTASIS (ARGON PLASMA COAGULATION/BICAP);  Surgeon: Legrand Victory LITTIE DOUGLAS, MD;  Location: [facility] ENDOSCOPY;  Service: Gastroenterology;  Laterality: N/A;   HOT HEMOSTASIS N/A 02/13/2021   Procedure: HOT HEMOSTASIS (ARGON PLASMA COAGULATION/BICAP);  Surgeon: Charlanne Groom, MD;  Location: [facility] ENDOSCOPY;  Service: Endoscopy;  Laterality: N/A;   HOT HEMOSTASIS N/A 05/07/2021   Procedure: HOT HEMOSTASIS (ARGON PLASMA COAGULATION/BICAP);  Surgeon: Leigh Elspeth SQUIBB, MD;  Location: [facility] ENDOSCOPY;  Service: Gastroenterology;  Laterality: N/A;   INSERTION OF DIALYSIS CATHETER N/A 02/08/2022   Procedure: INSERTION OF TUNNELED DIALYSIS CATHETER;  Surgeon: Eliza Lonni RAMAN, MD;  Location: [name] OR;  Service: Vascular;  Laterality: N/A;   INTERTROCHANTERIC HIP FRACTURE SURGERY Left 11/2006   thelbert 03/08/2011   IR FLUORO GUIDE CV LINE LEFT  03/14/2022   IR FLUORO GUIDE CV LINE RIGHT  07/24/2019   IR FLUORO GUIDE CV LINE RIGHT  07/30/2019   IR US  GUIDE VASC ACCESS  RIGHT  07/24/2019   IR US  GUIDE VASC ACCESS RIGHT  07/30/2019   LAPAROSCOPIC CHOLECYSTECTOMY  05/2006   LIGATION OF ARTERIOVENOUS  FISTULA Left 02/08/2022   Procedure: LIGATION OF LEFT ARTERIOVENOUS  FISTULA;  Surgeon: Eliza Lonni RAMAN, MD;  Location: [facility] OR;  Service: Vascular;  Laterality: Left;   LIGATION OF COMPETING BRANCHES OF ARTERIOVENOUS FISTULA Left 11/05/2018   Procedure: LIGATION OF COMPETING BRANCHES OF ARTERIOVENOUS FISTULA  LEFT  ARM;  Surgeon: Gretta Lonni PARAS, MD;  Location: [facility] OR;  Service: Vascular;  Laterality: Left;   LOWER EXTREMITY ANGIOGRAPHY N/A 04/18/2024   Procedure: Lower Extremity Angiography;  Surgeon: Gretta Lonni PARAS, MD;  Location: [facility] INVASIVE CV LAB;  Service: Cardiovascular;  Laterality: N/A;   LUMBAR LAMINECTOMY/DECOMPRESSION MICRODISCECTOMY N/A 02/29/2016   Procedure: Left L4-5 Lateral Recess Decompression, Removal Extradural Intraspinal Facet Cyst;  Surgeon: Oneil JAYSON Herald, MD;  Location: [facility] OR;  Service: Orthopedics;  Laterality: N/A;   METATARSAL HEAD EXCISION Right 07/20/2022   Procedure: Irragation and debridement of right foot wound with extraction of all necrotic soft tissue and bone;  Surgeon: Malvin Marsa FALCON, DPM;  Location: [facility] OR;  Service: Podiatry;  Laterality: Right;  Right 4th Met head and base of proximal phalanx resection   MULTIPLE TOOTH EXTRACTIONS     ORIF MANDIBULAR FRACTURE Left 08/13/2004   ORIF of left body fracture mandible with KLS Martin 2.3-mm six hole/notes 03/08/2011   RIGHT/LEFT HEART CATH AND CORONARY/GRAFT ANGIOGRAPHY N/A 12/19/2019   Procedure: RIGHT/LEFT HEART CATH AND CORONARY/GRAFT ANGIOGRAPHY;  Surgeon: Dann Candyce RAMAN, MD;  Location: [facility] INVASIVE CV LAB;  Service: Cardiovascular;  Laterality: N/A;   SCLEROTHERAPY  12/22/2019   Procedure: SCLEROTHERAPY;  Surgeon: [name] Sandor GAILS, DO;  Location: [facility] ENDOSCOPY;  Service: Gastroenterology;;   MATIAS  02/13/2021   Procedure: MATIAS;  Surgeon: Charlanne Groom, MD;  Location: [facility] ENDOSCOPY;  Service: Endoscopy;;    Family History: Family History  Problem Relation Age of Onset   Heart failure Father    Hypertension Father    Diabetes Brother    Heart attack Brother    Alzheimer's disease Mother    Stroke Sister    Diabetes Sister    Alzheimer's disease Sister    Hypertension Brother    Diabetes Brother    Drug abuse Brother    Colon cancer Neg Hx     Social History  reports that he has been smoking cigarettes. He has a 21.5 pack-year smoking history. He has been exposed to tobacco smoke. He has never used smokeless tobacco. He reports that he does not currently use alcohol after a past usage of about 12.0 standard drinks of alcohol per week. He reports that he does not currently use drugs after having used the following drugs: Cocaine.  Allergies  Allergen Reactions   Augmentin  [Amoxicillin -Pot Clavulanate] Diarrhea and Other (See Comments)    Severe diarrhea   Morphine  Sulfate Anaphylaxis    Can't mix with oxycodone     Mucinex  Fast-Max Other (See Comments)    Intense sweating    Amphetamines Other (See Comments)    Unknown reaction    Medications   Current Facility-Administered Medications:    [START ON 04/20/2024]  stroke: early stages of recovery book, , Does not apply, Once, Danford, Lonni SQUIBB, MD   0.9 %  sodium chloride  infusion, 250 mL, Intravenous, PRN, Gretta Lonni PARAS, MD   0.9 %  sodium chloride  infusion, 250 mL, Intravenous, PRN, Gretta Lonni PARAS, MD   acetaminophen  (TYLENOL ) tablet 650 mg, 650 mg, Oral, Q6H PRN **OR** acetaminophen  (TYLENOL ) suppository 650 mg, 650 mg, Rectal, Q6H PRN, Gretta Lonni PARAS, MD   allopurinol  (ZYLOPRIM ) tablet 100 mg, 100 mg, Oral, Daily, Gretta Lonni PARAS, MD, 100 mg at 04/19/24 0951   aspirin  EC tablet 81 mg, 81 mg, Oral, Daily, Gretta Lonni PARAS, MD, 81 mg at 04/19/24 9048   atorvastatin  (LIPITOR ) tablet 10 mg, 10 mg, Oral, Daily, Gretta Lonni  J, MD, 10  mg at 04/19/24 9048   bictegravir-emtricitabine -tenofovir  AF (BIKTARVY ) 50-200-25 MG per tablet 1 tablet, 1 tablet, Oral, Daily, Gretta Lonni PARAS, MD, 1 tablet at 04/19/24 0950   Chlorhexidine  Gluconate Cloth 2 % PADS 6 each, 6 each, Topical, Q0600, Gretta Lonni PARAS, MD   [START ON 04/20/2024] Chlorhexidine  Gluconate Cloth 2 % PADS 6 each, 6 each, Topical, Q0600, Geralynn Charleston, MD   [COMPLETED] clopidogrel  (PLAVIX ) tablet 300 mg, 300 mg, Oral, Once, 300 mg at 04/18/24 2013 **FOLLOWED BY** clopidogrel  (PLAVIX ) tablet 75 mg, 75 mg, Oral, Q breakfast, Gretta Lonni PARAS, MD, 75 mg at 04/19/24 9047   feeding supplement (ENSURE PLUS HIGH PROTEIN) liquid 237 mL, 237 mL, Oral, BID BM, Danford, Lonni SQUIBB, MD   gabapentin  (NEURONTIN ) capsule 300 mg, 300 mg, Oral, QHS, Gretta Lonni PARAS, MD, 300 mg at 04/18/24 2138   heparin  injection 5,000 Units, 5,000 Units, Subcutaneous, Q8H, Gretta Lonni PARAS, MD, 5,000 Units at 04/18/24 9187   hydrALAZINE  (APRESOLINE ) injection 5 mg, 5 mg, Intravenous, Q20 Min PRN, Gretta Lonni PARAS, MD   HYDROmorphone  (DILAUDID ) injection 0.5 mg, 0.5 mg, Intravenous, Q4H PRN, Shona, Carole N, DO, 0.5 mg at 04/19/24 0034   insulin  aspart (novoLOG ) injection 0-5 Units, 0-5 Units, Subcutaneous, QHS, Clark, Lonni PARAS, MD   insulin  aspart (novoLOG ) injection 0-9 Units, 0-9 Units, Subcutaneous, TID WC, Gretta Lonni PARAS, MD, 1 Units at 04/18/24 1251   labetalol  (NORMODYNE ) injection 10 mg, 10 mg, Intravenous, Q10 min PRN, Gretta Lonni PARAS, MD   LORazepam  (ATIVAN ) tablet 1 mg, 1 mg, Oral, Once PRN, Danford, Lonni SQUIBB, MD   multivitamin (RENA-VIT) tablet 1 tablet, 1 tablet, Oral, QHS, Danford, Lonni SQUIBB, MD   nutrition supplement (JUVEN) (JUVEN) powder packet 1 packet, 1 packet, Oral, BID BM, Danford, Lonni SQUIBB, MD   ondansetron  (ZOFRAN ) tablet 4 mg, 4 mg, Oral, Q6H PRN **OR** ondansetron  (ZOFRAN ) injection 4 mg, 4 mg, Intravenous, Q6H PRN, Gretta Lonni PARAS, MD   oxyCODONE  (Oxy IR/ROXICODONE ) immediate release tablet 5 mg, 5 mg, Oral, Q4H PRN, Gretta Lonni PARAS, MD, 5 mg at 04/19/24 1118   sodium chloride  flush (NS) 0.9 % injection 3 mL, 3 mL, Intravenous, Q12H, Gretta Lonni PARAS, MD, 3 mL at 04/18/24 2021   sodium chloride  flush (NS) 0.9 % injection 3 mL, 3 mL, Intravenous, PRN, Gretta Lonni PARAS, MD   sodium chloride  flush (NS) 0.9 % injection 3 mL, 3 mL, Intravenous, Q12H, Gretta Lonni PARAS, MD, 3 mL at 04/18/24 2021   sodium chloride  flush (NS) 0.9 % injection 3 mL, 3 mL, Intravenous, PRN, Gretta Lonni PARAS, MD   umeclidinium-vilanterol (ANORO ELLIPTA ) 62.5-25 MCG/ACT 1 puff, 1 puff, Inhalation, Daily, Gretta Lonni PARAS, MD, 1 puff at 04/19/24 0849  Vitals   Vitals:   04/19/24 0655 04/19/24 0706 04/19/24 0756 04/19/24 0851  BP:  133/83 128/75   Pulse:  95 94 94  Resp: 15 16    Temp:  (!) 97.4 F (36.3 C) 98.2 F (36.8 C)   TempSrc:  Oral Oral   SpO2:  100% 99%   Weight:      Height:        Body mass index is 22.47 kg/m.   Physical Exam   Constitutional: Appears well-developed and well-nourished.  Psych: flat Eyes: No scleral injection.  HENT: No OP obstruction.  Head: Normocephalic.  Cardiovascular: Normal rate and regular rhythm.  Respiratory: Effort normal, non-labored breathing.  GI: Soft.  No distension. There is no tenderness.  Skin:  WDI.   Neurologic Examination   Mental Status -  Awake and alert, oriented to self, and age, and city. Unable to state name of place or type. Mild aphasia   Cranial Nerves II - XII - II - Visual field intact OU . III, IV, VI - Extraocular movements intact . V - Facial sensation intact bilaterally . VII - left facial droop  VIII - Hearing & vestibular intact bilaterally . X - Palate elevates symmetrically . XI - Chin turning & shoulder shrug intact bilaterally . XII - Tongue protrusion intact .  Motor Strength - The patient's strength was normal  in all extremities and pronator drift was absent.  Bulk was normal and fasciculations were absent .   Motor Tone - Muscle tone was assessed at the neck and appendages and was normal . Sensory - Light touch, temperature/pinprick were assessed and were symmetrical.   Coordination - The patient had normal movements in the hands and feet with no ataxia or dysmetria.  Tremor was absent. Gait and Station - deferred.  Labs/Imaging/Neurodiagnostic studies   CBC:  Recent Labs  Lab 17-May-2024 1615 04/18/24 0544 04/19/24 0917  WBC 8.3 7.3 8.2  NEUTROABS 5.0  --   --   HGB 12.9* 11.6* 12.6*  HCT 41.7 36.3* 39.4  MCV 105.3* 103.1* 103.4*  PLT 121* 141* 145*   Basic Metabolic Panel:  Lab Results  Component Value Date   NA 135 04/19/2024   K 5.1 04/19/2024   CO2 23 04/19/2024   GLUCOSE 170 (H) 04/19/2024   BUN 25 (H) 04/19/2024   CREATININE 6.67 (H) 04/19/2024   CALCIUM  9.1 04/19/2024   GFRNONAA 8 (L) 04/19/2024   GFRAA 12 (L) 01/12/2021   Lipid Panel:  Lab Results  Component Value Date   LDLCALC 75 12/26/2023   HgbA1c:  Lab Results  Component Value Date   HGBA1C 7.7 (H) 12/26/2023   Urine Drug Screen:     Component Value Date/Time   LABOPIA NONE DETECTED 02/12/2021 2322   COCAINSCRNUR NONE DETECTED 02/12/2021 2322   COCAINSCRNUR NEGATIVE 09/07/2013 0122   LABBENZ NONE DETECTED 02/12/2021 2322   LABBENZ NEGATIVE 09/07/2013 0122   AMPHETMU NONE DETECTED 02/12/2021 2322   THCU NONE DETECTED 02/12/2021 2322   LABBARB NONE DETECTED 02/12/2021 2322    Alcohol Level     Component Value Date/Time   ETH <10 12/27/2023 1430   INR  Lab Results  Component Value Date   INR 1.1 09/30/2022   APTT  Lab Results  Component Value Date   APTT 32 07/01/2011   MRI Brain(Personally reviewed): Multiple small acute or early subacute infarcts in the left midbrain and right frontoparietal white matter.  Neurodiagnostics rEEG:  Ordered   Labs B12 427 TSH 0.466 Ammonia 27    ASSESSMENT   Jacob [name] is a 73 y.o. male  hx of  CHF, COPD, CAD, HIV,  ESRD on HD, HTN, HLD, MI, ischemic cardiomyopathy, peripheral neuropathy, DM, hx of SDH, syphilis, gout glaucoma, hx apical aneurysm hep C, hx GIB due to AVMs, still smoking, hx of drug abuse, PVD of RLE, chronic RLE wound who presented to [facility] for nonhealing wound and critical limb ischemia of right lower leg.  He underwent RLE distal SFA and popliteal artery angioplasty.  Due to persistent confusion, MRI brain obtained and revealed multiple acute/subacute strokes bilaterally.  Etiology unclear.  Given an ischemic limb, evaluate for underlying infection, and although somewhat not as likely given no leukocytosis or fevers, septic  emboli are possible as the etiology of stroke.   That said, not sure if I am totally convinced that the degree of altered mental status correlates with the size of the strokes but embolic events can sometimes cause altered mental status out of proportion to the size of the strokes and I hope that is what his mentation is altered from-will add EEG and metabolic workup.  Impression: Evaluate for acute ischemic stroke Evaluate for encephalopathy  RECOMMENDATIONS  -Frequent neurochecks -Telemetry -Blood cultures  - Routine EEG to evaluate for seizures  - HgbA1c, fasting lipid panel- already ordered  - Echocardiogram- ordered  - CTA head and neck - Prophylactic therapy-Antiplatelet med: Aspirin  - dose 81mg  and plavix  75mg  daily  - Risk factor modification - Telemetry monitoring - PT consult, OT consult, Speech consult -B12, ammonia, TSH - Stroke team to follow ______________________________________________________________________  Signed, Karna DELENA Geralds, NP [facility]   Attending Neurohospitalist Addendum Patient seen and examined with APP/Resident. Agree with the history and physical as documented above. Agree with the plan as documented, which I helped formulate. I  have independently reviewed the chart, obtained history, review of systems and examined the patient.I have personally reviewed pertinent head/neck/spine imaging (CT/MRI). Please feel free to call with any questions.  -- Eligio Lav, MD Neurologist [facility] Pager: [phone]

## 2024-04-19 NOTE — Progress Notes (Signed)
 [facility] Progress Note  Subjective:  Seen in room, no c/o's  Vitals:   04/19/24 0655 04/19/24 0706 04/19/24 0756 04/19/24 0851  BP:  133/83 128/75   Pulse:  95 94 94  Resp: 15 16    Temp:  (!) 97.4 F (36.3 C) 98.2 F (36.8 C)   TempSrc:  Oral Oral   SpO2:  100% 99%   Weight:      Height:        Exam: Gen more alert today, looks better No jvd or bruits Chest mild rales L base 1/3 up, better RRR no MRG Abd soft ntnd no mass or ascites +bs MS bilat feet are wrapped Ext 1+ bilat PT edema, no other edema Neuro better    RFA AVG+bruit         OP HD: [facility] MWF 3h  B400   86kg  2K  R AVG  HEparin  none Last OP HD 6/25, post wt 86.6kg Gets to dry wt, good compliance Low BP's in the 70-90s on HD       Assessment/ Plan: Ischemic wound right foot: per pmd ESRD: on HD MWF. Had HD last night. Next HD Sat off schedule, goal 2.5 L  Hypoxia: pulm edema and rales/ hypoxia, improving today Volume: mild LE edema, other as above Anemia of esrd: Hb 11-13 here, follow Secondary hyperparathyroidism: CCa in range, add phos, cont binders ac      Myer Fret MD  [facility] 04/19/2024, 3:53 PM  Recent Labs  Lab 04/18/24 0544 04/18/24 1210 04/19/24 0917 04/19/24 1209  HGB 11.6*  --  12.6*  --   ALBUMIN  2.4*  --   --   --   CALCIUM  8.6*  --   --  9.1  PHOS  --  6.8*  --   --   CREATININE 7.61*  --   --  6.67*  K 4.4  --   --  5.1   No results for input(s): IRON , TIBC, FERRITIN in the last 168 hours. Inpatient medications:  allopurinol   100 mg Oral Daily   aspirin  EC  81 mg Oral Daily   atorvastatin   10 mg Oral Daily   bictegravir-emtricitabine -tenofovir  AF  1 tablet Oral Daily   Chlorhexidine  Gluconate Cloth  6 each Topical Q0600   clopidogrel   75 mg Oral Q breakfast   feeding supplement  237 mL Oral BID BM   gabapentin   300 mg Oral QHS   heparin   5,000 Units Subcutaneous Q8H   insulin  aspart  0-5 Units Subcutaneous QHS   insulin  aspart  0-9 Units  Subcutaneous TID WC   multivitamin  1 tablet Oral QHS   nutrition supplement (JUVEN)  1 packet Oral BID BM   sodium chloride  flush  3 mL Intravenous Q12H   sodium chloride  flush  3 mL Intravenous Q12H   umeclidinium-vilanterol  1 puff Inhalation Daily    sodium chloride      sodium chloride      sodium chloride , sodium chloride , acetaminophen  **OR** acetaminophen , hydrALAZINE , HYDROmorphone  (DILAUDID ) injection, labetalol , LORazepam , ondansetron  **OR** ondansetron  (ZOFRAN ) IV, oxyCODONE , sodium chloride  flush, sodium chloride  flush

## 2024-04-19 NOTE — Progress Notes (Signed)
 Received patient in bed to unit.  Alert and oriented.  Informed consent signed and in chart.   TX duration:2hours  Patient tolerated well.  Transported back to the room  Alert, without acute distress.  Hand-off given to patient's nurse.   Access used: graft Access issues: none  Total UF removed: 2100 ml Medication(s) given: none Post HD VS: 137/117 Post HD weight: unable to obtain.   04/19/24 0706  Vitals  Temp (!) 97.4 F (36.3 C)  Temp Source Oral  BP 133/83  MAP (mmHg) 96  BP Location Left Arm  BP Method Automatic  Patient Position (if appropriate) Lying  Pulse Rate 95  Pulse Rate Source Monitor  ECG Heart Rate 92  Resp 16  Oxygen Therapy  SpO2 100 %  O2 Device Room Air  Patient Activity (if Appropriate) In bed  Pulse Oximetry Type Continuous  During Treatment Monitoring  Blood Flow Rate (mL/min) 0 mL/min  Arterial Pressure (mmHg) 28.08 mmHg  Venous Pressure (mmHg) 20.8 mmHg  TMP (mmHg) 76.36 mmHg  Ultrafiltration Rate (mL/min) 1318 mL/min  Dialysate Flow Rate (mL/min) 300 ml/min  Dialysate Potassium Concentration 3  Dialysate Calcium  Concentration 2.5  Duration of HD Treatment -hour(s) 2.58 hour(s)  Cumulative Fluid Removed (mL) per Treatment  2116.05  Post Treatment  Dialyzer Clearance Lightly streaked  Hemodialysis Intake (mL) 0 mL  Liters Processed 56  Fluid Removed (mL) 2100 mL  Tolerated HD Treatment Yes  Post-Hemodialysis Comments HD tx not completed as expecte due to clotting, tolerated well, pt is table came early off, MD made aware.  AVG/AVF Arterial Site Held (minutes) 10 minutes  AVG/AVF Venous Site Held (minutes) 10 minutes  Note  Patient Observations pt isin bed resting  Fistula / Graft Right Forearm  No placement date or time found.   Orientation: Right  Access Location: Forearm  Site Condition No complications  Fistula / Graft Assessment Present;Thrill;Bruit  Status Deaccessed  Drainage Description None      [facility]

## 2024-04-19 NOTE — Discharge Instructions (Signed)
   [facility]  Discharge Instructions  Lower Extremity Angiogram; Angioplasty/Stenting  Please refer to the following instructions for your post-procedure care. Your surgeon or physician assistant will discuss any changes with you.  Activity  Avoid lifting more than 8 pounds (1 gallons of milk) for 5 days after your procedure. You may walk as much as you can tolerate. It's OK to drive after 72 hours.  Bathing/Showering  You may shower the day after your procedure. If you have a bandage, you may remove it at 24- 48 hours. Clean your incision site with mild soap and water. Pat the area dry with a clean towel.  Diet  Resume your pre-procedure diet. There are no special food restrictions following this procedure. All patients with peripheral vascular disease should follow a low fat/low cholesterol diet. In order to heal from your surgery, it is CRITICAL to get adequate nutrition. Your body requires vitamins, minerals, and protein. Vegetables are the best source of vitamins and minerals. Vegetables also provide the perfect balance of protein. Processed food has little nutritional value, so try to avoid this.  Medications  Resume taking all of your medications unless your doctor tells you not to. If your incision is causing pain, you may take over-the-counter pain relievers such as acetaminophen (Tylenol)  Follow Up  Follow up will be arranged at the time of your procedure. You may have an office visit scheduled or may be scheduled for surgery. Ask your surgeon if you have any questions.  Please call us immediately for any of the following conditions: .Severe or worsening pain your legs or feet at rest or with walking. .Increased pain, redness, drainage at your groin puncture site. .Fever of 101 degrees or higher. .If you have any mild or slow bleeding from your puncture site: lie down, apply firm constant pressure over the area with a piece of gauze or a  clean wash cloth for 30 minutes- no peeking!, call 911 right away if you are still bleeding after 30 minutes, or if the bleeding is heavy and unmanageable.  Reduce your risk factors of vascular disease:  . Stop smoking. If you would like help call [facility] at 1-800-QUIT-NOW (228-025-3008) or [place] at 319-820-6853. . Manage your cholesterol . Maintain a desired weight . Control your diabetes . Keep your blood pressure down .  If you have any questions, please call the office at [phone]

## 2024-04-19 NOTE — Progress Notes (Signed)
 Initial Nutrition Assessment  DOCUMENTATION CODES:   Non-severe (moderate) malnutrition in context of chronic illness  INTERVENTION:  Continue Regular diet as ordered Ensure Plus High Protein po BID, each supplement provides 350 kcal and 20 grams of protein. Juven BID to support wound healing Renal MVI with minerals daily  NUTRITION DIAGNOSIS:   Moderate Malnutrition related to chronic illness (ESRD, HIV, COPD) as evidenced by mild fat depletion, severe muscle depletion.  GOAL:   Patient will meet greater than or equal to 90% of their needs  MONITOR:   PO intake, Supplement acceptance, Labs, Weight trends, Skin, I & O's  REASON FOR ASSESSMENT:   Consult Wound healing  ASSESSMENT:   Pt admitted with non-healing, chronic right medial calf wound. PMH significant for ESRD, HIV, hepatitis C, PVD, COPD, systolic CHF, DM, CAD.   6/26: s/p aortogram, RLE arteriogram; right proximal AT angioplasty with plain balloon and shockwave lithotripsy   Pt is an unreliable historian. Focused on finding his green sweatpants. Flowsheet documentation reflects pt to be oriented to place only.   No documented meal completions on file to review.   EDW: 86kg Admission weight: 79.4 kg - question whether this is actual vs stated If this weight is accurate, pt is below EDW.   Last HD session over night Net UF 2.1L  Medications: SSI 0-5 units at bedtime, SSI 0-9 units TID  Labs:  BUN 25 Cr 7.61 Anion gap 17 GFR 7  NUTRITION - FOCUSED PHYSICAL EXAM: Flowsheet Row Most Recent Value  Orbital Region Mild depletion  Upper Arm Region Severe depletion  Thoracic and Lumbar Region No depletion  Buccal Region Mild depletion  Temple Region Mild depletion  Clavicle Bone Region No depletion  Clavicle and Acromion Bone Region No depletion  Scapular Bone Region No depletion  Dorsal Hand Severe depletion  Patellar Region Severe depletion  Anterior Thigh Region Severe depletion  Posterior Calf  Region Severe depletion  Edema (RD Assessment) None  Hair Reviewed  Eyes Reviewed  Mouth Reviewed  Skin Reviewed  Nails Reviewed    Diet Order:   Diet Order             Diet regular Room service appropriate? Yes; Fluid consistency: Thin  Diet effective now                   EDUCATION NEEDS:   Not appropriate for education at this time  Skin:  Skin Assessment: Reviewed RN Assessment  Last BM:  pta  Height:   Ht Readings from Last 1 Encounters:  04/17/24 6' 2 (1.88 m)    Weight:   Wt Readings from Last 1 Encounters:  04/17/24 79.4 kg   BMI:  Body mass index is 22.47 kg/m.  Estimated Nutritional Needs:   Kcal:  2000-2200  Protein:  115-130g  Fluid:  1L + UOP  Royce Maris, RDN, LDN Clinical Nutrition See AM[facility] for contact information.

## 2024-04-19 NOTE — Progress Notes (Addendum)
 Progress Note    04/19/2024 8:07 AM 1 Day Post-Op  Subjective:  complaining about being bothered all night and morning by nursing staff   Vitals:   04/19/24 0706 04/19/24 0756  BP: 133/83 128/75  Pulse: 95 94  Resp: 16   Temp: (!) 97.4 F (36.3 C) 98.2 F (36.8 C)  SpO2: 100% 99%   Physical Exam: Cardiac:  regular Lungs:  non labored Incisions:  L CF access site without swelling or hematoma Extremities:  RLE well perfused and warm with Doppler AT Abdomen:  soft Neurologic: alert and oriented   CBC    Component Value Date/Time   WBC 7.3 04/18/2024 0544   RBC 3.52 (L) 04/18/2024 0544   HGB 11.6 (L) 04/18/2024 0544   HGB 9.3 (L) 08/09/2022 1625   HCT 36.3 (L) 04/18/2024 0544   HCT 28.6 (L) 08/09/2022 1625   PLT 141 (L) 04/18/2024 0544   PLT 129 (L) 08/09/2022 1625   MCV 103.1 (H) 04/18/2024 0544   MCV 104 (H) 08/09/2022 1625   MCH 33.0 04/18/2024 0544   MCHC 32.0 04/18/2024 0544   RDW 15.1 04/18/2024 0544   RDW 14.4 08/09/2022 1625   LYMPHSABS 2.1 04/17/2024 1615   LYMPHSABS 0.9 12/17/2019 1042   MONOABS 1.0 04/17/2024 1615   EOSABS 0.1 04/17/2024 1615   EOSABS 0.1 12/17/2019 1042   BASOSABS 0.1 04/17/2024 1615   BASOSABS 0.0 12/17/2019 1042    BMET    Component Value Date/Time   NA 136 04/18/2024 0544   NA 135 (A) 02/02/2023 0000   K 4.4 04/18/2024 0544   CL 94 (L) 04/18/2024 0544   CO2 25 04/18/2024 0544   GLUCOSE 132 (H) 04/18/2024 0544   BUN 25 (H) 04/18/2024 0544   BUN 59 (A) 02/02/2023 0000   CREATININE 7.61 (H) 04/18/2024 0544   CREATININE 6.53 (H) 02/24/2022 0927   CALCIUM  8.6 (L) 04/18/2024 0544   GFRNONAA 7 (L) 04/18/2024 0544   GFRNONAA 10 (L) 01/12/2021 1139   GFRAA 12 (L) 01/12/2021 1139    INR    Component Value Date/Time   INR 1.1 09/30/2022 0520     Intake/Output Summary (Last 24 hours) at 04/19/2024 9192 Last data filed at 04/19/2024 0706 Gross per 24 hour  Intake --  Output 2100 ml  Net -2100 ml      Assessment/Plan:  73 y.o. male is s/p Aortogram, arteriogram  of RLE with right distal SFA and popliteal artery angioplasty and shockwave lithotripsy, right proximal AT angioplasty with plain balloon and shockwave lithotripsy 1 Day Post-Op   RLE is now maximally revascularized with signal vessel runoff through the AT Brisk doppler AT signal Left CF access without swelling or hematoma Right Calf wound care per WOC recommendations. Likely will need outpatient wound care follow up Continue Aspirin , Plavix , Statin Will arrange 1 month follow up in our office with ABI and RLE arterial duplex  Teretha Damme, PA-C [facility] [phone] 04/19/2024 8:07 AM  I have seen and evaluated the patient. I agree with the PA note as documented above.  Left groin looks good after right popliteal and AT shockwave lithotripsy for tissue loss.  Optimized from a vascular surgery standpoint.  Brisk right AT DP signals.  Will arrange follow-up in 1 month in our office.  Needs aggressive wound care and is currently being followed in the outpatient wound care center.  Discussed aspirin  statin Plavix  for patency of intervention with the patient.  Lonni DOROTHA Gaskins, MD [facility] [place] Office: [phone]

## 2024-04-19 NOTE — Evaluation (Signed)
 Physical Therapy Evaluation Patient Details Name: Jacob [name] MRN: [id number] DOB: Dec 17, 1950 Today's Date: 04/19/2024  History of Present Illness  Pt is 73 yo presenting to [facility] ED on 6/25 due to nonhealing R medial calf wound. PMH - ESRD on HD, HIV, DM2 with neuropathy, HTN, and chronic diabetic ulcer of R foot.  Clinical Impression  Limited evaluation this session due to mentation. Pt unable to follow directions without getting agitated or stating paranoid statements. Pt demonstrates generalized weakness overall. Pt able to perform supine<>sitting at supervision level with significant increase in time and struggling. Due to pt current functional status, home set up and available assistance at home recommending skilled physical therapy services < 3 hours/day in order to address strength, balance and functional mobility to decrease risk for falls, injury, immobility, skin break down and re-hospitalization.        If plan is discharge home, recommend the following: Assistance with cooking/housework;A little help with walking and/or transfers;Assist for transportation;Help with stairs or ramp for entrance   Can travel by private vehicle   Yes    Equipment Recommendations BSC/3in1     Functional Status Assessment Patient has had a recent decline in their functional status and demonstrates the ability to make significant improvements in function in a reasonable and predictable amount of time.     Precautions / Restrictions Precautions Precautions: Fall Recall of Precautions/Restrictions: Impaired Restrictions Weight Bearing Restrictions Per Provider Order: No      Mobility  Bed Mobility Overal bed mobility: Needs Assistance Bed Mobility: Supine to Sit, Sit to Supine     Supine to sit: Supervision Sit to supine: Supervision   General bed mobility comments: increased time, pt struggles due to poor core strength.    Transfers       General transfer comment: due to cognition  pt did not perform       Balance Overall balance assessment: Needs assistance Sitting-balance support: Feet supported, Bilateral upper extremity supported Sitting balance-Leahy Scale: Fair Sitting balance - Comments: pt has posterior lean takes time to get balance sitting EOB         Pertinent Vitals/Pain Pain Assessment Pain Assessment: No/denies pain    Home Living Family/patient expects to be discharged to:: Private residence Living Arrangements: Alone Available Help at Discharge: Family;Friend(s) Type of Home: Apartment Home Access: Level entry       Home Layout: One level Home Equipment: Rollator (4 wheels);Grab bars - tub/shower;Grab bars - toilet;Shower seat;Hand held Engineer, civil (consulting) - single point Additional Comments: Lives in handicapped accessible senior apartments; information taken from previous chart    Prior Function Prior Level of Function : Needs assist             Mobility Comments: Amb with rollator ADLs Comments: Assist for groceries etc     Extremity/Trunk Assessment   Upper Extremity Assessment Upper Extremity Assessment: Generalized weakness;Defer to OT evaluation    Lower Extremity Assessment Lower Extremity Assessment: Generalized weakness    Cervical / Trunk Assessment Cervical / Trunk Assessment: Kyphotic  Communication   Communication Communication: No apparent difficulties    Cognition Arousal: Alert Behavior During Therapy: Agitated, Impulsive   PT - Cognitive impairments: No family/caregiver present to determine baseline, Orientation, Awareness, Memory, Attention, Initiation, Sequencing, Problem solving, Safety/Judgement   Orientation impairments: Place, Time, Situation       PT - Cognition Comments: Pt thinks he is in [place] even after being asked to read [facility] on the wall. Pt stating there was a little  person who is friends with the people in the hall running around stealing things. Pt no cooperative with  instruction Following commands: Impaired Following commands impaired: Follows one step commands inconsistently     Cueing Cueing Techniques: Verbal cues, Tactile cues, Visual cues            Assessment/Plan    PT Assessment Patient needs continued PT services  PT Problem List Decreased strength;Decreased balance;Decreased mobility       PT Treatment Interventions DME instruction;Gait training;Stair training;Functional mobility training;Therapeutic activities;Therapeutic exercise;Balance training;Patient/family education    PT Goals (Current goals can be found in the Care Plan section)  Acute Rehab PT Goals PT Goal Formulation: Patient unable to participate in goal setting Time For Goal Achievement: 05/03/24 Potential to Achieve Goals: Fair    Frequency Min 2X/week        AM-PAC PT 6 Clicks Mobility  Outcome Measure Help needed turning from your back to your side while in a flat bed without using bedrails?: A Little Help needed moving from lying on your back to sitting on the side of a flat bed without using bedrails?: A Little Help needed moving to and from a bed to a chair (including a wheelchair)?: A Little Help needed standing up from a chair using your arms (e.g., wheelchair or bedside chair)?: A Little Help needed to walk in hospital room?: A Little Help needed climbing 3-5 steps with a railing? : A Lot 6 Click Score: 17    End of Session   Activity Tolerance: Other (comment);Treatment limited secondary to agitation (pt limited due to mentation) Patient left: in bed;with bed alarm set;with call bell/phone within reach Nurse Communication: Mobility status PT Visit Diagnosis: Other abnormalities of gait and mobility (R26.89)    Time: 8684-8668 PT Time Calculation (min) (ACUTE ONLY): 16 min   Charges:   PT Evaluation $PT Eval Low Complexity: 1 Low   PT General Charges $$ ACUTE PT VISIT: 1 Visit         Dorothyann Maier, DPT, CLT  Acute Rehabilitation  Services Office: [phone] (Secure chat preferred)   Dorothyann VEAR Maier 04/19/2024, 1:43 PM

## 2024-04-19 NOTE — Progress Notes (Addendum)
 Patient declined EEG, witness by nurse. Patient pulled leads off and threaten tech. Tech will try later as schedule allows.   Per nurse, Camellia, patient will be moved to 3w . Tech will reach out to the nursing staff of 3w as to the availability of the patient.

## 2024-04-20 ENCOUNTER — Inpatient Hospital Stay (HOSPITAL_COMMUNITY)

## 2024-04-20 DIAGNOSIS — R4182 Altered mental status, unspecified: Secondary | ICD-10-CM

## 2024-04-20 DIAGNOSIS — S81801A Unspecified open wound, right lower leg, initial encounter: Secondary | ICD-10-CM | POA: Diagnosis not present

## 2024-04-20 DIAGNOSIS — F17210 Nicotine dependence, cigarettes, uncomplicated: Secondary | ICD-10-CM

## 2024-04-20 DIAGNOSIS — I6389 Other cerebral infarction: Secondary | ICD-10-CM | POA: Diagnosis not present

## 2024-04-20 DIAGNOSIS — E785 Hyperlipidemia, unspecified: Secondary | ICD-10-CM

## 2024-04-20 DIAGNOSIS — I6381 Other cerebral infarction due to occlusion or stenosis of small artery: Secondary | ICD-10-CM | POA: Diagnosis not present

## 2024-04-20 DIAGNOSIS — I132 Hypertensive heart and chronic kidney disease with heart failure and with stage 5 chronic kidney disease, or end stage renal disease: Secondary | ICD-10-CM | POA: Diagnosis not present

## 2024-04-20 DIAGNOSIS — B20 Human immunodeficiency virus [HIV] disease: Secondary | ICD-10-CM

## 2024-04-20 DIAGNOSIS — R569 Unspecified convulsions: Secondary | ICD-10-CM | POA: Diagnosis not present

## 2024-04-20 DIAGNOSIS — E1165 Type 2 diabetes mellitus with hyperglycemia: Secondary | ICD-10-CM

## 2024-04-20 DIAGNOSIS — I69391 Dysphagia following cerebral infarction: Secondary | ICD-10-CM | POA: Diagnosis not present

## 2024-04-20 DIAGNOSIS — E1151 Type 2 diabetes mellitus with diabetic peripheral angiopathy without gangrene: Secondary | ICD-10-CM | POA: Diagnosis not present

## 2024-04-20 LAB — BASIC METABOLIC PANEL WITH GFR
Anion gap: 17 — ABNORMAL HIGH (ref 5–15)
BUN: 53 mg/dL — ABNORMAL HIGH (ref 8–23)
CO2: 27 mmol/L (ref 22–32)
Calcium: 9.6 mg/dL (ref 8.9–10.3)
Chloride: 94 mmol/L — ABNORMAL LOW (ref 98–111)
Creatinine, Ser: 9.16 mg/dL — ABNORMAL HIGH (ref 0.61–1.24)
GFR, Estimated: 6 mL/min — ABNORMAL LOW (ref >60)
Glucose, Bld: 157 mg/dL — ABNORMAL HIGH (ref 70–99)
Potassium: 4.2 mmol/L (ref 3.5–5.1)
Sodium: 138 mmol/L (ref 135–145)

## 2024-04-20 LAB — GLUCOSE, CAPILLARY
Glucose-Capillary: 137 mg/dL — ABNORMAL HIGH (ref 70–99)
Glucose-Capillary: 224 mg/dL — ABNORMAL HIGH (ref 70–99)
Glucose-Capillary: 175 mg/dL — ABNORMAL HIGH (ref 70–99)
Glucose-Capillary: 249 mg/dL — ABNORMAL HIGH (ref 70–99)

## 2024-04-20 LAB — ECHOCARDIOGRAM COMPLETE
AR max vel: 3.14 cm2
AV Area VTI: 3.2 cm2
AV Area mean vel: 2.76 cm2
AV Mean grad: 2 mmHg
AV Peak grad: 4.6 mmHg
Ao pk vel: 1.07 m/s
Area-P 1/2: 4.8 cm2
Calc EF: 31.6 %
Height: 74 in
S' Lateral: 4.5 cm
Single Plane A2C EF: 33 %
Single Plane A4C EF: 30.8 %
Weight: 2137.58 [oz_av]

## 2024-04-20 LAB — LIPID PANEL
Cholesterol: 161 mg/dL (ref 0–200)
HDL: 34 mg/dL — ABNORMAL LOW (ref >40)
LDL Cholesterol: 83 mg/dL (ref 0–99)
Total CHOL/HDL Ratio: 4.7 ratio
Triglycerides: 222 mg/dL — ABNORMAL HIGH (ref <150)
VLDL: 44 mg/dL — ABNORMAL HIGH (ref 0–40)

## 2024-04-20 LAB — HEMOGLOBIN A1C
Hgb A1c MFr Bld: 6.9 % — ABNORMAL HIGH (ref 4.8–5.6)
Mean Plasma Glucose: 151.33 mg/dL

## 2024-04-20 LAB — FOLATE: Folate: >40 ng/mL (ref >5.9)

## 2024-04-20 LAB — CK: Total CK: 139 U/L (ref 49–397)

## 2024-04-20 MED ORDER — LORAZEPAM 1 MG PO TABS
1.0000 mg | ORAL_TABLET | Freq: Once | ORAL | Status: DC
  Filled 2024-04-20: qty 1

## 2024-04-20 MED ORDER — CYCLOBENZAPRINE HCL 10 MG PO TABS: 10.0000 mg | ORAL_TABLET | Freq: Once | ORAL | Status: DC

## 2024-04-20 MED ORDER — ATORVASTATIN CALCIUM 10 MG PO TABS: 20.0000 mg | ORAL_TABLET | Freq: Every day | ORAL | Status: DC

## 2024-04-20 NOTE — Progress Notes (Signed)
 Echocardiogram 2D Echocardiogram has been performed.  Mayson Sterbenz N Junie Avilla,RDCS 04/20/2024, 11:31 AM

## 2024-04-20 NOTE — Procedures (Signed)
 Patient Name: Jacob [name]  MRN: [id number]  Epilepsy Attending: Arlin MALVA Krebs  Referring Physician/Provider: Waddell Karna LABOR, NP  Date: 04/20/2024 Duration: 26.17 mins  Patient history: 73yo m with ams. EEG to evaluate for seizure  Level of alertness: Awake, asleep  AEDs during EEG study: None  Technical aspects: This EEG study was done with scalp electrodes positioned according to the 10-20 International system of electrode placement. Electrical activity was reviewed with band pass filter of 1-70Hz , sensitivity of 7 uV/mm, display speed of 37mm/sec with a 60Hz  notched filter applied as appropriate. EEG data were recorded continuously and digitally stored.  Video monitoring was available and reviewed as appropriate.  Description: The posterior dominant rhythm consists of 7 Hz activity of moderate voltage (25-35 uV) seen predominantly in posterior head regions, symmetric and reactive to eye opening and eye closing.  Sleep was characterized by vertex waves, sleep spindles (12 to 14 Hz), maximal frontocentral region.  Hyperventilation and photic stimulation were not performed.     ABNORMALITY - Background slow  IMPRESSION: This study is suggestive of mild diffuse encephalopathy. No seizures or epileptiform discharges were seen throughout the recording.  Jacob [name]

## 2024-04-20 NOTE — Plan of Care (Signed)
  Problem: Clinical Measurements: Goal: Ability to maintain clinical measurements within normal limits will improve Outcome: Progressing Goal: Will remain free from infection Outcome: Progressing Goal: Diagnostic test results will improve Outcome: Progressing Goal: Respiratory complications will improve Outcome: Progressing Goal: Cardiovascular complication will be avoided Outcome: Progressing   Problem: Activity: Goal: Risk for activity intolerance will decrease Outcome: Progressing   Problem: Pain Managment: Goal: General experience of comfort will improve and/or be controlled Outcome: Progressing   Problem: Skin Integrity: Goal: Risk for impaired skin integrity will decrease Outcome: Progressing   Problem: Safety: Goal: Ability to remain free from injury will improve Outcome: Progressing

## 2024-04-20 NOTE — Progress Notes (Signed)
 Routine EEG completed, results pending Neurology review and interpretation

## 2024-04-20 NOTE — Progress Notes (Addendum)
 STROKE TEAM PROGRESS NOTE    SIGNIFICANT HOSPITAL EVENTS 6/25- presenting from home   INTERIM HISTORY/SUBJECTIVE Seen in room today with sister at the bedside Still having some confusion and dysarthria. Sister states he lives alone and relatively independent but she suspects that he may have some cognitive impairment.  MRI scan shows a left midbrain cerebral Brault peduncle and right periventricular white matter lacunar infarct. OBJECTIVE  CBC    Component Value Date/Time   WBC 8.2 04/19/2024 0917   RBC 3.81 (L) 04/19/2024 0917   HGB 12.6 (L) 04/19/2024 0917   HGB 9.3 (L) 08/09/2022 1625   HCT 39.4 04/19/2024 0917   HCT 28.6 (L) 08/09/2022 1625   PLT 145 (L) 04/19/2024 0917   PLT 129 (L) 08/09/2022 1625   MCV 103.4 (H) 04/19/2024 0917   MCV 104 (H) 08/09/2022 1625   MCH 33.1 04/19/2024 0917   MCHC 32.0 04/19/2024 0917   RDW 15.1 04/19/2024 0917   RDW 14.4 08/09/2022 1625   LYMPHSABS 2.1 04/17/2024 1615   LYMPHSABS 0.9 12/17/2019 1042   MONOABS 1.0 04/17/2024 1615   EOSABS 0.1 04/17/2024 1615   EOSABS 0.1 12/17/2019 1042   BASOSABS 0.1 04/17/2024 1615   BASOSABS 0.0 12/17/2019 1042    BMET    Component Value Date/Time   NA 135 04/19/2024 1209   NA 135 (A) 02/02/2023 0000   K 5.1 04/19/2024 1209   CL 94 (L) 04/19/2024 1209   CO2 23 04/19/2024 1209   GLUCOSE 170 (H) 04/19/2024 1209   BUN 25 (H) 04/19/2024 1209   BUN 59 (A) 02/02/2023 0000   CREATININE 6.67 (H) 04/19/2024 1209   CREATININE 6.53 (H) 02/24/2022 0927   CALCIUM  9.1 04/19/2024 1209   EGFR 6 (L) 08/09/2022 1625   GFRNONAA 8 (L) 04/19/2024 1209   GFRNONAA 10 (L) 01/12/2021 1139    IMAGING past 24 hours MR BRAIN WO CONTRAST Result Date: 04/19/2024 CLINICAL DATA:  Mental status change, unknown cause EXAM: MRI HEAD WITHOUT CONTRAST TECHNIQUE: Multiplanar, multiecho pulse sequences of the brain and surrounding structures were obtained without intravenous contrast. COMPARISON:  None Available. FINDINGS:  Motion limited and incomplete study. Only DWI/ADC, axial FLAIR, and SWI were performed. Within this limitation: Brain: Multiple small acute or early subacute infarcts in the left midbrain and right frontoparietal white matter. No substantial mass effect. No midline shift. No evidence of obvious acute hemorrhage, mass lesion or hydrocephalus. Scattered T2/FLAIR hyperintensity white matter are nonspecific but compatible with chronic microvascular ischemic disease. Vascular: Not well evaluated without T2 imaging. Skull and upper cervical spine: Normal marrow signal. Sinuses/Orbits: Clear stenosis.  No acute orbital findings. Other: No mastoid effusions. IMPRESSION: Motion limited and incomplete study. Multiple small acute or early subacute infarcts in the left midbrain and right frontoparietal white matter. Given involvement of multiple vascular territories, consider an embolic etiology. These results will be called to the ordering clinician or representative by the Radiologist Assistant, and communication documented in the PACS or [employer]. Electronically Signed   By: Gilmore GORMAN Molt M.D.   On: 04/19/2024 16:21    Vitals:   04/19/24 2355 04/20/24 0406 04/20/24 0734 04/20/24 0801  BP: 125/62 134/73 120/60   Pulse: 87 86 72   Resp: 17 19 16    Temp:  98.7 F (37.1 C) 98.5 F (36.9 C)   TempSrc:  Axillary Oral   SpO2: 97% 100% 91% 95%  Weight:      Height:         PHYSICAL EXAM General:  Alert, well-nourished, well-developed patient in no acute distress Psych:  Mood and affect appropriate for situation CV: Regular rate and rhythm on monitor Respiratory:  Regular, unlabored respirations on room air GI: Abdomen soft and nontender   NEURO:  Mental Status: alert and oriented to self. States president is Levy, year is correct. Unable to add change with out assistance. Repetition intact. 2/3 words  Speech/Language: speech is dysarthric but no aphasia.   Cranial Nerves:  II: PERRL. Visual  fields full.  III, IV, VI: EOMI. Eyelids elevate symmetrically.  V: Sensation is intact to light touch and symmetrical to face.  VII: Face is symmetrical resting and smiling VIII: hearing intact to voice. IX, X: Palate elevates symmetrically. Phonation is normal.  KP:Dynloizm shrug 5/5. XII: tongue is midline without fasciculations. Motor: 5/5 strength to all muscle groups tested.  Tone: is normal and bulk is normal Sensation- Intact to light touch bilaterally. Extinction absent to light touch to DSS.   Coordination: FTN intact bilaterally, HKS: no ataxia in BLE.No drift.  Gait- deferred    ASSESSMENT/PLAN  Mr. NIKLAUS MAMARIL is a 73 y.o. male with history of  CHF, COPD, CAD, HIV,  ESRD on HD, HTN, HLD, MI, ischemic cardiomyopathy, peripheral neuropathy, DM, hx of SDH, syphilis, gout glaucoma, hx apical aneurysm hep C, hx GIB due to AVMs, still smoking, hx of drug abuse, PVD of RLE, chronic RLE wound who presented to [facility] for nonhealing wound and critical limb ischemia of right lower leg.  He underwent RLE distal SFA and popliteal artery angioplasty.  Due to confusion he had MRI brain which revealed Multiple small acute or early subacute infarcts in the left midbrain and right frontoparietal white matter.  NIH on Admission 3  Acute Ischemic Infarct:  3 separate lacunar strokes  Etiology: Small vessel disease MRI  Multiple small acute or early subacute infarcts in the left midbrain and right frontoparietal white matter. Given involvement of multiple vascular territories, consider an embolic etiology. MRA  ordered EEG pending  2D Echo EF 30-35%. Mild LVH with grade II diastolic dysfunction. LA severely dilated. RA mildly dilated  LDL 75 HgbA1c 7.7 B12 598 TSH 0.656 Ammonia 25 VTE prophylaxis - heparin  SQ aspirin  81 mg daily and clopidogrel  75 mg daily prior to admission, now on aspirin  81 mg daily and clopidogrel  75 mg daily due to peripheral vascular disease Therapy  recommendations:  Pending Disposition:  pending   Hx of SDH 10/2020- Traumatic subdural hematoma   Hypertension CAd HTN Ischemic cardiomyopathy  Home meds:  coreg  3.125mg , Imdur  30 mg,  Stable Blood Pressure Goal: SBP less than 160   Hyperlipidemia Home meds:  atorvastatin  10mg , resumed in hospital LDL 75, goal < 70 Increase to 20mg   Continue statin at discharge  Diabetes type II UnControlled Home meds:  Tresiba , insulin   HgbA1c 7.7, goal < 7.0 CBGs SSI Recommend close follow-up with PCP for better DM control  Tobacco Abuse Patient smokes cigarettes 21.5 pack years       Ready to quit? No Nicotine  replacement therapy provided  Substance Abuse Hx of cocaine use   Dysphagia Patient has post-stroke dysphagia, SLP consulted    Diet   Diet regular Room service appropriate? Yes; Fluid consistency: Thin   Advance diet as tolerated  Wound care as ordered  Vascular following   COPD Home meds per primary team   HIV  Biktarvy - per primary team   Other Stroke Risk Factors ETOH use, alcohol level <10, advised to drink no more  than 2 drink(s) a day Coronary artery disease Congestive heart failure   Other Active Problems Right leg wounds - followed by VVS s/p Aortogram, arteriogram  of RLE with right distal SFA and popliteal artery angioplasty and shockwave lithotripsy, right proximal AT angioplasty with plain balloon and shockwave lithotripsy RLE is now maximally revascularized with signal vessel runoff through the AT brisk doppler AT signal Left CF access without swelling or hematoma Right Calf wound care per WOC recommendations. Likely will need outpatient wound care follow up Continue Aspirin , Plavix , Statin Will arrange 1 month follow up in our office with ABI and RLE arterial duplex ESRD MWF per renal   Hospital day # 3  Patient seen and examined by NP/APP with MD. MD to update note as needed.   Jorene Last, DNP, FNP-BC [facility] Pager:  [phone]  I have personally obtained history,examined this patient, reviewed notes, independently viewed imaging studies, participated in medical decision making and plan of care.ROS completed by me personally and pertinent positives fully documented  I have made any additions or clarifications directly to the above note. Agree with note above.  Patient presented with confusion and dysarthria following recent right lower extremity angioplasty stenting for severe peripheral vascular disease.  MRI scan shows left midbrain and right periventricular white matter lacunar infarcts.  Continue dual antiplatelet therapy for his significant peripheral vascular disease and aggressive risk factor modification.  Check MRI angiograms as unable to obtain CT angiogram due to inability to find the appropriate size IV.  Echocardiogram and lipid profile.  Long discussion with patient and family at the bedside and answered questions.  Greater than 50% time during this 50-minute visit was spent in counseling and coordination of care discussion patient care team and answered questions.  Discussed with Dr. Jonel Eather Popp, MD Medical Director [facility] Pager: [phone] 04/20/2024 3:30 PM   To contact Stroke Continuity provider, please refer to [email or web address]. After hours, contact General Neurology

## 2024-04-20 NOTE — Progress Notes (Signed)
 Admission Notes:  2150H - Received patient from 4E on bed accompanied by RN and NT. No IV line. With fistula on Right arm for Hemodialysis. Patient alert. Able to follow simple command but seems irritable. No complains of pain. Safety protocol started, side rails up, wheels locked and call bell within reach. Hooked on telebox [id number]

## 2024-04-20 NOTE — Progress Notes (Signed)
 Progress Note   Patient: Jacob [name] FMW:[id number] DOB: Jun 12, 1951 DOA: 04/17/2024     3 DOS: the patient was seen and examined on 04/20/2024 at 10:49AM      Brief hospital course: 73 y.o. M with ESRD on HD MWF, HIV, HTN, sCHF EF 40-45%, hx apical aneurysm, DM, CAD s/p CABG 2004 last PCI 2021, hep C, hx GIB due to AVMs, still smoking, PVD of RLE, chronic RLE wound who presented with nonhealing right medial calf wound.    Hospitalization complicated by encpehalopathy, stroke.     Assessment and Plan: * Ischemic leg wound Peripheral vascular disease S/p angioplasty No evidence of cellulitis.  Odor more today, no clear infection, WBC normal - Continue aspirin , Lipitor , Plavix  - Consult WOC - Daily dressing change    Acute metabolic encephalopathy Mentation slowly improving but still waxing and waning, still disoriented at times.  Ammonia, B12 TSH normal.   Probably this is delirium from small embolic strokes, but agree with Neurology, has a global encephalopathy clinical appearance. - Obtain EEG - Follow blood cultures  Stroke MRI brain showed scattered left MCA punctate infarcts - Non-invasive angiography pending - Echocardiogram showed no cardiogenic source of embolism - Carotid imaging pending   - Lipids ordered, on Lipitor  - Aspirin  and Plavix    ESRD (end stage renal disease) (HCC) - Consult nephrology for HD  Human immunodeficiency virus (HIV) disease (HCC) HIV quant last Jan undetectable - Continue Biktarvy   Left ventricular aneurysm, resolved Aneurysm has resolved.    History of GI bleeding due to AVMs On octreotide  monthly as an outpatient, hemoglobin appears stable, no reported bleeding   History of hepatitis C LFTs normal  Chronic obstructive pulmonary disease (HCC) No evidence of flare - Continue Anoro  Anemia of chronic disease Hgb stable, no bleeding observed  Chronic systolic CHF (congestive heart failure) (HCC) EF now down to 30-35% -  Hold carvedilol  until hemodynamics clear - Volume status per HD  DM (diabetes mellitus) (HCC) Glucose controlled - Hold Tresiba  given until diet started - Continue sliding scale corrections   Coronary artery disease with angina Hypertension - Continue Lipitor  (had been stopped outpatient due to muscle aches) - Check CK - Hold carvedilol  and Imdur  due to soft BP            Subjective: Sluggish and sleepy, but no increase in pain, no new drainage.  No fever.  No focal changes on neurological exam, still somewhat confused but reorientable, and mostly coherent.     Physical Exam: BP (P) 131/66 (BP Location: Left Arm)   Pulse (P) 75   Temp (!) (P) 97.4 F (36.3 C) (Oral)   Resp (P) 17   Ht 6' 2 (1.88 m)   Wt 60.6 kg   SpO2 (P) 97%   BMI 17.15 kg/m   Thin elderly male, lying in bed, appears well tired RRR, no murmurs, no peripheral edema Respiratory rate normal, lungs clear without rales or wheezes Abdomen soft, no tenderness palpation He is alert and responds to questions, but becomes sleepy after a while and dozes off during conversation, when asked he is able to state that he is at [facility], able to identify his sister, able to explain that he is here for the wound in his leg, and that he has had revascularization of his right leg    Data Reviewed: Basic metabolic panel and CBC unable to be obtained today due to inability to draw labs after multiple rounds     Family Communication:  sister at bedside    Disposition: Status is: Inpatient         Author: Lonni SHAUNNA Dalton, MD 04/20/2024 3:03 PM  For on call review [email or web address].

## 2024-04-20 NOTE — Evaluation (Signed)
 Occupational Therapy Evaluation Patient Details Name: Jacob [name] MRN: [id number] DOB: 12/28/1950 Today's Date: 04/20/2024   History of Present Illness   Pt is 73 yo presenting to [facility] ED on 6/25 due to nonhealing R medial calf wound. PMH - ESRD on HD, HIV, DM2 with neuropathy, HTN, and chronic diabetic ulcer of R foot.     Clinical Impressions Pt admitted for above, PTA pt lived alone and reports being ind with ADLs and ambulating mod I with Rollator at home. Pt was more receptive to therapy session today, somewhat limited interest in all assessments but he demonstrates ability to use BUEs functional during tasks. He is able to complete his Adls with max A to setup A and requires mod A +2 assist to help stand. Pt presenting as generally weak compared to baseline. OT to continue following pt acutely to progress pt closer to baseline. Patient will benefit from continued inpatient follow up therapy, <3 hours/day      If plan is discharge home, recommend the following:   A lot of help with walking and/or transfers;A little help with bathing/dressing/bathroom;Assistance with cooking/housework;Supervision due to cognitive status     Functional Status Assessment   Patient has had a recent decline in their functional status and demonstrates the ability to make significant improvements in function in a reasonable and predictable amount of time.     Equipment Recommendations   BSC/3in1;Tub/shower seat;Other (comment) (Rollator; if person one is not found)     Recommendations for Other Services         Precautions/Restrictions   Precautions Precautions: Fall Recall of Precautions/Restrictions: Impaired Restrictions Weight Bearing Restrictions Per Provider Order: No     Mobility Bed Mobility Overal bed mobility: Needs Assistance Bed Mobility: Supine to Sit     Supine to sit: Contact guard          Transfers Overall transfer level: Needs assistance Equipment used:  Rolling walker (2 wheels) Transfers: Sit to/from Stand, Bed to chair/wheelchair/BSC Sit to Stand: Mod assist, +2 physical assistance, +2 safety/equipment     Step pivot transfers: Contact guard assist     General transfer comment: cues for hand placement. Mod A +2 to rise into standing but once OOB can pivot with CGA +1.      Balance   Sitting-balance support: Feet supported, Bilateral upper extremity supported Sitting balance-Leahy Scale: Fair     Standing balance support: Reliant on assistive device for balance, Bilateral upper extremity supported Standing balance-Leahy Scale: Poor Standing balance comment: RW support                           ADL either performed or assessed with clinical judgement   ADL Overall ADL's : Needs assistance/impaired Eating/Feeding: Independent;Sitting   Grooming: Sitting;Set up;Wash/dry face   Upper Body Bathing: Sitting;Set up   Lower Body Bathing: Set up;Sitting/lateral leans   Upper Body Dressing : Sitting;Set up   Lower Body Dressing: Moderate assistance;+2 for physical assistance;Sit to/from stand Lower Body Dressing Details (indicate cue type and reason): doff/don bilat socks with setup, More assist needed for STS dressing. Toilet Transfer: +2 for safety/equipment;+2 for physical assistance;Moderate assistance Toilet Transfer Details (indicate cue type and reason): Mod A +2 to stand but can pivot with less assist. Toileting- Clothing Manipulation and Hygiene: Maximal assistance;Sit to/from stand               Vision   Additional Comments: to be further assessed, deferred this session.  Pt was more agreeable to things today so may be open to it next session.     Perception         Praxis         Pertinent Vitals/Pain Pain Assessment Pain Assessment: Faces Faces Pain Scale: Hurts little more Pain Location: RLE wound site Pain Descriptors / Indicators: Discomfort, Sore Pain Intervention(s): Monitored during  session, Limited activity within patient's tolerance     Extremity/Trunk Assessment Upper Extremity Assessment Upper Extremity Assessment: Generalized weakness (BUE ROM WFL)           Communication Communication Communication: No apparent difficulties   Cognition Arousal: Alert Behavior During Therapy:  (a bit irritable)       Awareness: Intellectual awareness intact, Online awareness impaired       OT - Cognition Comments: Follows simple step commands, deferred too much cognitive probing to avoid agitation.                 Following commands: Impaired Following commands impaired: Follows one step commands with increased time     Cueing  General Comments   Cueing Techniques: Verbal cues;Tactile cues;Visual cues  VSS. Pt sister present and agreeable to plan   Exercises     Shoulder Instructions      Home Living Family/patient expects to be discharged to:: Private residence Living Arrangements: Alone Available Help at Discharge: Family;Friend(s) Type of Home: Apartment Home Access: Level entry     Home Layout: One level     Bathroom Shower/Tub: Producer, television/film/video: Handicapped height Bathroom Accessibility: Yes   Home Equipment: Rollator (4 wheels);Grab bars - tub/shower;Grab bars - toilet;Shower seat;Hand held Engineer, civil (consulting) - single point   Additional Comments: Lives in handicapped accessible senior apartments; information taken from previous chart      Prior Functioning/Environment Prior Level of Function : Needs assist             Mobility Comments: Amb with rollator ADLs Comments: Assist for groceries etc. ind ADLs    OT Problem List: Impaired balance (sitting and/or standing);Decreased safety awareness;Decreased strength;Decreased activity tolerance;Pain   OT Treatment/Interventions: Self-care/ADL training;Patient/family education;Therapeutic exercise;Balance training;Therapeutic activities;DME and/or AE instruction       OT Goals(Current goals can be found in the care plan section)   Acute Rehab OT Goals Patient Stated Goal: To get stronger OT Goal Formulation: With patient Time For Goal Achievement: 05/04/24 Potential to Achieve Goals: Good ADL Goals Pt Will Perform Grooming: with supervision;standing Pt Will Perform Lower Body Bathing: sit to/from stand;with contact guard assist Pt Will Perform Lower Body Dressing: with supervision;sit to/from stand Pt Will Transfer to Toilet: with supervision;ambulating Additional ADL Goal #3: Pt will complete >5 mins OOB activity to demonstrate improved activity tolerance.   OT Frequency:  Min 2X/week    Co-evaluation              AM-PAC OT 6 Clicks Daily Activity     Outcome Measure Help from another person eating meals?: None Help from another person taking care of personal grooming?: A Little Help from another person toileting, which includes using toliet, bedpan, or urinal?: A Lot Help from another person bathing (including washing, rinsing, drying)?: A Little Help from another person to put on and taking off regular upper body clothing?: A Little Help from another person to put on and taking off regular lower body clothing?: A Little 6 Click Score: 18   End of Session Equipment Utilized During Treatment: Gait belt;Rolling walker (2 wheels)  Nurse Communication: Other (comment) (notified RN of pt need for bath and nurse call cord)  Activity Tolerance: Patient tolerated treatment well Patient left: in chair;with call bell/phone within reach;with family/visitor present;with chair alarm set  OT Visit Diagnosis: Unsteadiness on feet (R26.81);Other abnormalities of gait and mobility (R26.89);Pain;Muscle weakness (generalized) (M62.81) Pain - Right/Left: Right Pain - part of body: Leg                Time: 8972-8948 OT Time Calculation (min): 24 min Charges:  OT General Charges $OT Visit: 1 Visit OT Evaluation $OT Eval Moderate Complexity:  1 Mod  04/20/2024  AB, OTR/L  Acute Rehabilitation Services  Office: [phone]   Jacob [name] 04/20/2024, 11:06 AM

## 2024-04-20 NOTE — Plan of Care (Signed)
  Problem: Education: Goal: Ability to describe self-care measures that may prevent or decrease complications (Diabetes Survival Skills Education) will improve 04/20/2024 0614 by Gaetana Randall Mathew GORMAN, RN Outcome: Progressing 04/20/2024 0529 by Gaetana Randall Mathew GORMAN, RN Outcome: Progressing Goal: Individualized Educational Video(s) 04/20/2024 0614 by Gaetana Randall Mathew GORMAN, RN Outcome: Progressing 04/20/2024 0529 by Gaetana Randall Mathew GORMAN, RN Outcome: Progressing   Problem: Tissue Perfusion: Goal: Adequacy of tissue perfusion will improve 04/20/2024 0614 by Gaetana Randall Mathew GORMAN, RN Outcome: Progressing 04/20/2024 0529 by Gaetana Randall Mathew GORMAN, RN Outcome: Progressing   Problem: Education: Goal: Knowledge of General Education information will improve Description: Including pain rating scale, medication(s)/side effects and non-pharmacologic comfort measures 04/20/2024 0614 by Gaetana Randall Mathew GORMAN, RN Outcome: Progressing 04/20/2024 0529 by Gaetana Randall Mathew GORMAN, RN Outcome: Progressing   Problem: Activity: Goal: Risk for activity intolerance will decrease 04/20/2024 0614 by Gaetana Randall Mathew GORMAN, RN Outcome: Progressing 04/20/2024 0529 by Gaetana Randall Mathew GORMAN, RN Outcome: Progressing

## 2024-04-20 NOTE — Progress Notes (Signed)
 Pt refuses night time meds. He is not cooperative with the NIHSS stroke scale checking and general shift assessment.   Pt only said  I take the wrong pill. I don' t want any med!  He only said  no in any questions I asked him.   He is stable hemodynamically, afebrile, NSR on the monitor, no obvious acute distress at this moment. We will try again later and continue to monitor.   Wendi Dash, RN

## 2024-04-20 NOTE — Progress Notes (Signed)
 No IV line when transferred on the floor last night, patient pulled it out as endorsed by the RN. Tried to put a new one but patient refused and became more irritable. On-call Dr. KYM Hurst notified.

## 2024-04-20 NOTE — Progress Notes (Signed)
 [id number] - Patient tried to get out of bed and said I have lots of things to do. I need to go to the bank, Re-orient patient that he is on the hospital and not allowed to go outside. Still insisted, charge nurse informed, spoke to the patient.  Herald.Halt - Patient agreed to be back on bed.

## 2024-04-20 NOTE — Progress Notes (Signed)
 Followed up with ED to see if patients green Rolator walker was located. Sister of patient upset and says his walker is not here and he came with it. After confirmation that no green walker was found, sister says friend who was in the ED with patient may have took it but she has no contact number for that friend to confirm.

## 2024-04-20 NOTE — Progress Notes (Signed)
 Physical Therapy Treatment Patient Details Name: Jacob [name] MRN: [id number] DOB: April 05, 1951 Today's Date: 04/20/2024   History of Present Illness Pt is 73 yo presenting to [facility] ED on 6/25 due to nonhealing R medial calf wound. 6/26 s/p angioplasty. 6/27 Brain MRI showed multiple small acute or early subacute infarcts in L midbrain and R frontoparietal white matter. PMH - ESRD on HD, HIV, DM2 with neuropathy, HTN, and chronic diabetic ulcer of R foot.    PT Comments  Pt in bed upon arrival and agreeable to PT session with encouragement. Pt was able to stand with RW and ModAx2 for boost-up. Once in standing, pt was able to perform step-pivot with CGA for safety. Decreased WB on RLE with knee in flexed position. Pt declined gait training at this time and was focused on having dressing changed. Continue to recommend <3hrs post acute rehab to work towards independence with mobility. Pt and sister in agreement with d/c recommendation. Acute PT to follow.     If plan is discharge home, recommend the following: Assistance with cooking/housework;Assist for transportation;Help with stairs or ramp for entrance;A lot of help with walking and/or transfers;A little help with bathing/dressing/bathroom   Can travel by private vehicle     No  Equipment Recommendations  BSC/3in1    Recommendations for Other Services       Precautions / Restrictions Precautions Precautions: Fall Recall of Precautions/Restrictions: Impaired Restrictions Weight Bearing Restrictions Per Provider Order: No     Mobility  Bed Mobility Overal bed mobility: Needs Assistance Bed Mobility: Supine to Sit     Supine to sit: Contact guard     General bed mobility comments: CGA for safety with increased time    Transfers Overall transfer level: Needs assistance Equipment used: Rolling walker (2 wheels) Transfers: Sit to/from Stand, Bed to chair/wheelchair/BSC Sit to Stand: Mod assist, +2 physical assistance, +2  safety/equipment   Step pivot transfers: Contact guard assist       General transfer comment: ModAx2 for boost-up from EOB. Cues for hand placement. Able to perform step-pivot with R LE slightly flexed and limited WB    Ambulation/Gait    General Gait Details: declined   Modified Rankin (Stroke Patients Only) Modified Rankin (Stroke Patients Only) Pre-Morbid Rankin Score: No symptoms Modified Rankin: Moderately severe disability     Balance Overall balance assessment: Needs assistance Sitting-balance support: Feet supported, Bilateral upper extremity supported Sitting balance-Leahy Scale: Fair     Standing balance support: Reliant on assistive device for balance, Bilateral upper extremity supported Standing balance-Leahy Scale: Poor Standing balance comment: RW support       Communication Communication Communication: No apparent difficulties  Cognition Arousal: Alert Behavior During Therapy:  (a little irritable)   PT - Cognitive impairments: Awareness, Attention, Initiation, Sequencing, Problem solving, Safety/Judgement    PT - Cognition Comments: externally distracted by missing rollator and need for dressing change. Able to redirect with cueing and increased time Following commands: Impaired Following commands impaired: Follows one step commands with increased time    Cueing Cueing Techniques: Verbal cues, Tactile cues, Visual cues  Exercises General Exercises - Lower Extremity Hip Flexion/Marching: AROM, Right, 5 reps, Seated    General Comments General comments (skin integrity, edema, etc.): VSS on RA. Discussed POC with pt and sister      Pertinent Vitals/Pain Pain Assessment Pain Assessment: Faces Faces Pain Scale: Hurts little more Pain Location: RLE wound site Pain Descriptors / Indicators: Discomfort, Sore Pain Intervention(s): Limited activity within patient's tolerance, Monitored  during session, Repositioned    Home Living Family/patient  expects to be discharged to:: Private residence Living Arrangements: Alone Available Help at Discharge: Family;Friend(s) Type of Home: Apartment Home Access: Level entry       Home Layout: One level Home Equipment: Rollator (4 wheels);Grab bars - tub/shower;Grab bars - toilet;Shower seat;Hand held Engineer, civil (consulting) - single point Additional Comments: Lives in handicapped accessible senior apartments; information taken from previous chart        PT Goals (current goals can now be found in the care plan section) Acute Rehab PT Goals PT Goal Formulation: Patient unable to participate in goal setting Time For Goal Achievement: 05/03/24 Potential to Achieve Goals: Fair Progress towards PT goals: Progressing toward goals    Frequency    Min 3X/week       AM-PAC PT 6 Clicks Mobility   Outcome Measure  Help needed turning from your back to your side while in a flat bed without using bedrails?: A Little Help needed moving from lying on your back to sitting on the side of a flat bed without using bedrails?: A Little Help needed moving to and from a bed to a chair (including a wheelchair)?: Total Help needed standing up from a chair using your arms (e.g., wheelchair or bedside chair)?: Total Help needed to walk in hospital room?: Total Help needed climbing 3-5 steps with a railing? : Total 6 Click Score: 10    End of Session Equipment Utilized During Treatment: Gait belt Activity Tolerance: Patient tolerated treatment well Patient left: in chair;with call bell/phone within reach (chair alarm under pt) Nurse Communication: Mobility status;Other (comment) (need for grey cord for chair alarm box, missing rollator) PT Visit Diagnosis: Other abnormalities of gait and mobility (R26.89)     Time: 8972-8948 PT Time Calculation (min) (ACUTE ONLY): 24 min  Charges:    $Therapeutic Activity: 8-22 mins PT General Charges $$ ACUTE PT VISIT: 1 Visit                    Kate ORN, PT,  DPT Secure Chat Preferred  Rehab Office [phone]   Kate BRAVO Wendolyn 04/20/2024, 11:17 AM

## 2024-04-20 NOTE — TOC Initial Note (Signed)
 Transition of Care [facility]) - Initial/Assessment Note    Patient Details  Name: Jacob [name] MRN: [id number] Date of Birth: 08-12-1951  [facility] CM/SW Contact:    Hartley KATHEE Robertson, LCSWA Phone Number: 04/20/2024, 11:47 AM  Clinical Narrative:                 CSW spoke with pt's sister Aldona and niece Jon via conference call as pt is not oriented, both in agreement pt is in need of STR at dc, both expressing first choice of [facility] in [place] or [place] as that is closer to family, only agreeable to be faxed out to [facility] if no beds closer. CSW explained insurance auth process and Medicare.gov ratings list. TOC will continue to follow as pt not medically ready to be faxed out at this time.         Patient Goals and CMS Choice            Expected Discharge Plan and Services                                              Prior Living Arrangements/Services                       Activities of Daily Living      Permission Sought/Granted                  Emotional Assessment              Admission diagnosis:  Leg wound, right, initial encounter [S81.801A] Patient Active Problem List   Diagnosis Date Noted   Malnutrition of moderate degree 04/19/2024   Stroke (HCC) 04/19/2024   Acute metabolic encephalopathy 04/18/2024   History of hepatitis C 04/18/2024   History of GI bleeding due to AVMs 04/18/2024   Left ventricular aneurysm 04/18/2024   Ischemic leg wound 04/17/2024   Leg wound, right 02/22/2024   Volume overload 01/23/2024   Fluid overload 06/14/2023   Encounter for smoking cessation counseling 03/07/2023   Diabetic infection of right foot (HCC) 07/18/2022   Chronic pain syndrome 03/29/2022   Chronic lower back pain    Thrombocytopenia (HCC)    Hypercalcemia 04/13/2021   Helicobacter pylori (H. pylori) infection 04/06/2021   Lumbar foraminal stenosis 03/30/2021   Gastric hemorrhage due to angiodysplasia  of stomach    GI bleed 11/24/2020   COVID-19 virus infection 11/24/2020   Other mechanical complication of cranial or spinal infusion catheter, sequela 11/11/2020   Subdural hematoma (HCC) 11/06/2020   Fall at home, initial encounter 11/05/2020   Nicotine  dependence, cigarettes, uncomplicated 11/05/2020   Alcohol abuse 11/05/2020   Unspecified trochanteric fracture of left femur, initial encounter for closed fracture (HCC) 11/05/2020   Traumatic subdural hematoma, initial encounter (HCC) 11/05/2020   Fracture of metatarsal of right foot, closed 11/05/2020   Nondisplaced fracture of greater trochanter of left femur, initial encounter for closed fracture (HCC)    Uremia    Angiodysplasia of intestine with hemorrhage    Pruritus, unspecified 10/02/2020   Iron  deficiency anemia    Acute blood loss anemia    Acute on chronic anemia    Abnormal nuclear stress test 12/19/2019   Type 2 diabetes mellitus with diabetic neuropathy, unspecified (HCC) 08/07/2019   Allergy, unspecified, initial encounter 08/02/2019   Complication of vascular  dialysis catheter 08/02/2019   Drug abuse counseling and surveillance of drug abuser 08/02/2019   Other specified coagulation defects (HCC) 08/02/2019   Secondary hyperparathyroidism of renal origin (HCC) 08/02/2019   Unspecified atrial fibrillation (HCC) 08/02/2019   ESRD (end stage renal disease) (HCC)    Cocaine abuse (HCC)    CHF exacerbation (HCC) 07/21/2019   Diabetic foot ulcer (HCC) 02/11/2019   AVM (arteriovenous malformation)    Melena 02/07/2019   Symptomatic anemia 02/06/2019   Hyperlipidemia associated with type 2 diabetes mellitus (HCC) 05/30/2018   Right foot ulcer (HCC) 02/28/2018   Chronic obstructive pulmonary disease (HCC) 02/28/2018   Anemia of chronic disease 11/20/2016   Chronic systolic CHF (congestive heart failure) (HCC) 11/10/2016   History of lumbar laminectomy for spinal cord decompression 02/29/2016   Type 2 diabetes mellitus  with diabetic polyneuropathy, with long-term current use of insulin  (HCC) 06/17/2015   HTN (hypertension) 04/26/2015   DM (diabetes mellitus) (HCC) 04/26/2015   Ischemic cardiomyopathy 05/12/2014   Hyperkalemia 09/06/2013   DM II, diet controlled 12/30/2011   Bunion of left foot 11/25/2011   Bunion, right foot 11/25/2011   Polysubstance abuse (HCC) 07/28/2011   Hip fracture, left (HCC) 04/04/2011   Closed fracture of neck of femur (HCC) 04/04/2011   Insomnia 02/18/2011   Coronary artery disease with angina 04/20/2009   Acute on chronic combined systolic and diastolic CHF (congestive heart failure) (HCC) 04/20/2009   Mixed hyperlipidemia 11/20/2006   Gout 11/20/2006   TOBACCO ABUSE 11/20/2006   Essential hypertension 11/20/2006   Human immunodeficiency virus (HIV) disease (HCC) 09/02/2006   PCP:  de [place], Raymond J, MD Pharmacy:   [facility] [phone] [place], [place] - [address] AT [facility] [address] [facility] [address] [name] [address] Phone: [phone] Fax: [phone]  [facility] - [place], [place] - [address] [place] [address] Phone: [phone] Fax: [phone]  [facility] [place], [place] - [address] [place] [address] Phone: [phone] Fax: [phone]     Social Drivers of Health (SDOH) Social History: SDOH Screenings   Food Insecurity: Patient Declined (04/18/2024)  Housing: Unknown (02/27/2024)  Transportation Needs: No Transportation Needs (02/27/2024)  Utilities: Not At Risk (02/27/2024)  Depression (PHQ2-9): Low Risk  (03/05/2024)  Financial Resource Strain: Low Risk  (10/26/2020)  Physical Activity: Insufficiently Active (06/26/2018)  Social Connections: Somewhat Isolated (06/26/2018)  Stress: No Stress Concern Present (02/27/2024)  Tobacco Use: High Risk (04/17/2024)  Health Literacy: Adequate Health Literacy  (02/27/2024)   SDOH Interventions:     Readmission Risk Interventions    06/15/2023    2:18 PM  Readmission Risk Prevention Plan  Transportation Screening Complete  Medication Review (RN Care Manager) Referral to Pharmacy  PCP or Specialist appointment within 3-5 days of discharge Complete  HRI or Home Care Consult Complete  SW Recovery Care/Counseling Consult Complete  Palliative Care Screening Not Applicable  Skilled Nursing Facility Not Applicable

## 2024-04-21 ENCOUNTER — Inpatient Hospital Stay (HOSPITAL_COMMUNITY)

## 2024-04-21 DIAGNOSIS — S81801A Unspecified open wound, right lower leg, initial encounter: Secondary | ICD-10-CM | POA: Diagnosis not present

## 2024-04-21 DIAGNOSIS — I69391 Dysphagia following cerebral infarction: Secondary | ICD-10-CM | POA: Diagnosis not present

## 2024-04-21 DIAGNOSIS — I132 Hypertensive heart and chronic kidney disease with heart failure and with stage 5 chronic kidney disease, or end stage renal disease: Secondary | ICD-10-CM | POA: Diagnosis not present

## 2024-04-21 DIAGNOSIS — I6381 Other cerebral infarction due to occlusion or stenosis of small artery: Secondary | ICD-10-CM | POA: Diagnosis not present

## 2024-04-21 DIAGNOSIS — E1151 Type 2 diabetes mellitus with diabetic peripheral angiopathy without gangrene: Secondary | ICD-10-CM | POA: Diagnosis not present

## 2024-04-21 LAB — RENAL FUNCTION PANEL
Albumin: 2.4 g/dL — ABNORMAL LOW (ref 3.5–5.0)
Anion gap: 18 — ABNORMAL HIGH (ref 5–15)
BUN: 90 mg/dL — ABNORMAL HIGH (ref 8–23)
CO2: 26 mmol/L (ref 22–32)
Calcium: 9.5 mg/dL (ref 8.9–10.3)
Chloride: 92 mmol/L — ABNORMAL LOW (ref 98–111)
Creatinine, Ser: 10.97 mg/dL — ABNORMAL HIGH (ref 0.61–1.24)
GFR, Estimated: 4 mL/min — ABNORMAL LOW (ref >60)
Glucose, Bld: 188 mg/dL — ABNORMAL HIGH (ref 70–99)
Phosphorus: 6.8 mg/dL — ABNORMAL HIGH (ref 2.5–4.6)
Potassium: 5.5 mmol/L — ABNORMAL HIGH (ref 3.5–5.1)
Sodium: 136 mmol/L (ref 135–145)

## 2024-04-21 LAB — BASIC METABOLIC PANEL WITH GFR
Anion gap: 18 — ABNORMAL HIGH (ref 5–15)
BUN: 79 mg/dL — ABNORMAL HIGH (ref 8–23)
CO2: 21 mmol/L — ABNORMAL LOW (ref 22–32)
Calcium: 8.9 mg/dL (ref 8.9–10.3)
Chloride: 94 mmol/L — ABNORMAL LOW (ref 98–111)
Creatinine, Ser: 10.43 mg/dL — ABNORMAL HIGH (ref 0.61–1.24)
GFR, Estimated: 5 mL/min — ABNORMAL LOW (ref >60)
Glucose, Bld: 180 mg/dL — ABNORMAL HIGH (ref 70–99)
Potassium: 6.1 mmol/L — ABNORMAL HIGH (ref 3.5–5.1)
Sodium: 133 mmol/L — ABNORMAL LOW (ref 135–145)

## 2024-04-21 LAB — RPR: RPR Ser Ql: NONREACTIVE

## 2024-04-21 LAB — GLUCOSE, CAPILLARY
Glucose-Capillary: 155 mg/dL — ABNORMAL HIGH (ref 70–99)
Glucose-Capillary: 164 mg/dL — ABNORMAL HIGH (ref 70–99)
Glucose-Capillary: 195 mg/dL — ABNORMAL HIGH (ref 70–99)
Glucose-Capillary: 184 mg/dL — ABNORMAL HIGH (ref 70–99)

## 2024-04-21 MED ORDER — ORAL CARE MOUTH RINSE: 15.0000 mL | OROMUCOSAL | Status: DC | PRN

## 2024-04-21 MED ORDER — LORAZEPAM 2 MG/ML IJ SOLN
0.5000 mg | INTRAMUSCULAR | Status: AC
  Administered 2024-04-21: 0.5 mg via INTRAVENOUS
  Filled 2024-04-21: qty 1

## 2024-04-21 MED ORDER — ATORVASTATIN CALCIUM 40 MG PO TABS
40.0000 mg | ORAL_TABLET | Freq: Every day | ORAL | Status: DC
  Administered 2024-04-21 – 2024-04-27 (×6): 40 mg via ORAL
  Filled 2024-04-21 (×6): qty 1

## 2024-04-21 MED ORDER — OXYCODONE HCL 5 MG PO TABS
5.0000 mg | ORAL_TABLET | Freq: Three times a day (TID) | ORAL | Status: DC | PRN
  Administered 2024-04-21 – 2024-04-22 (×2): 5 mg via ORAL
  Filled 2024-04-21 (×2): qty 1

## 2024-04-21 NOTE — Assessment & Plan Note (Addendum)
 MRI brain showed scattered left MCA punctate infarcts - MRA showed no high grade intracranial stenoses - Carotid US  pending - Echocardiogram showed no cardiogenic source of embolism - Lipids ordered, on Lipitor  - Continue DAPT - Neurology f/u after discharge - Smoking cessation recommended - SNF - 30-day even monitor at discharge

## 2024-04-21 NOTE — Progress Notes (Signed)
 STROKE TEAM PROGRESS NOTE    SIGNIFICANT HOSPITAL EVENTS 6/25- presenting from home   INTERIM HISTORY/SUBJECTIVE Seen in room today with RN at the bedside Still having some confusion and dysarthria.  EEG shows mild generalized slowing.  Echocardiogram showed ejection fraction of 30 to 35% with severely dilated left atrium.  MRI brain showed no large vessel stenosis but MRA neck is severely degraded due to motion artifacts.  Carotid ultrasound is pending.  Therapy is recommending skilled nursing facility for rehabitation. Neurological exam is unchanged.  Vital signs are stable. Vitamin B12, folate and RPR are all normal. OBJECTIVE  CBC    Component Value Date/Time   WBC 8.2 04/19/2024 0917   RBC 3.81 (L) 04/19/2024 0917   HGB 12.6 (L) 04/19/2024 0917   HGB 9.3 (L) 08/09/2022 1625   HCT 39.4 04/19/2024 0917   HCT 28.6 (L) 08/09/2022 1625   PLT 145 (L) 04/19/2024 0917   PLT 129 (L) 08/09/2022 1625   MCV 103.4 (H) 04/19/2024 0917   MCV 104 (H) 08/09/2022 1625   MCH 33.1 04/19/2024 0917   MCHC 32.0 04/19/2024 0917   RDW 15.1 04/19/2024 0917   RDW 14.4 08/09/2022 1625   LYMPHSABS 2.1 04/17/2024 1615   LYMPHSABS 0.9 12/17/2019 1042   MONOABS 1.0 04/17/2024 1615   EOSABS 0.1 04/17/2024 1615   EOSABS 0.1 12/17/2019 1042   BASOSABS 0.1 04/17/2024 1615   BASOSABS 0.0 12/17/2019 1042    BMET    Component Value Date/Time   NA 133 (L) 04/21/2024 0737   NA 135 (A) 02/02/2023 0000   K 6.1 (H) 04/21/2024 0737   CL 94 (L) 04/21/2024 0737   CO2 21 (L) 04/21/2024 0737   GLUCOSE 180 (H) 04/21/2024 0737   BUN 79 (H) 04/21/2024 0737   BUN 59 (A) 02/02/2023 0000   CREATININE 10.43 (H) 04/21/2024 0737   CREATININE 6.53 (H) 02/24/2022 0927   CALCIUM  8.9 04/21/2024 0737   EGFR 6 (L) 08/09/2022 1625   GFRNONAA 5 (L) 04/21/2024 0737   GFRNONAA 10 (L) 01/12/2021 1139    IMAGING past 24 hours MR ANGIO NECK WO CONTRAST Result Date: 04/20/2024 CLINICAL DATA:  Stroke/TIA, determine embolic  source EXAM: MRA NECK WITHOUT CONTRAST MRA HEAD WITHOUT CONTRAST TECHNIQUE: Angiographic images of the Circle of Willis were acquired using MRA technique without intravenous contrast. COMPARISON:  MRI head June 27, 25. FINDINGS: MRA NECK FINDINGS Severely motion limited MRA neck. The common carotid and internal carotid arteries appear patent with nondiagnostic evaluation for stenosis. Essentially nondiagnostic evaluation of the vertebral arteries. MRA HEAD FINDINGS Moderately motion limited study.  Within this limitation: Anterior circulation: Bilateral intracranial ICAs, MCAs, and ACAs are patent without proximal high-grade stenosis. Posterior circulation: Bilateral intradural vertebral arteries, basilar artery and bilateral posterior orders are pale without proximal high-grade stenosis. IMPRESSION: MRA head: Moderately motion limited without evidence of large vessel occlusion or proximal high-grade stenosis. MRA neck: Severely motion limited with patent carotids and nondiagnostic evaluation of the vertebral arteries. A CTA of the neck could better assess. Electronically Signed   By: Gilmore GORMAN Molt M.D.   On: 04/20/2024 22:00   MR ANGIO HEAD WO CONTRAST Result Date: 04/20/2024 CLINICAL DATA:  Stroke/TIA, determine embolic source EXAM: MRA NECK WITHOUT CONTRAST MRA HEAD WITHOUT CONTRAST TECHNIQUE: Angiographic images of the Circle of Willis were acquired using MRA technique without intravenous contrast. COMPARISON:  MRI head June 27, 25. FINDINGS: MRA NECK FINDINGS Severely motion limited MRA neck. The common carotid and internal carotid arteries appear patent  with nondiagnostic evaluation for stenosis. Essentially nondiagnostic evaluation of the vertebral arteries. MRA HEAD FINDINGS Moderately motion limited study.  Within this limitation: Anterior circulation: Bilateral intracranial ICAs, MCAs, and ACAs are patent without proximal high-grade stenosis. Posterior circulation: Bilateral intradural vertebral  arteries, basilar artery and bilateral posterior orders are pale without proximal high-grade stenosis. IMPRESSION: MRA head: Moderately motion limited without evidence of large vessel occlusion or proximal high-grade stenosis. MRA neck: Severely motion limited with patent carotids and nondiagnostic evaluation of the vertebral arteries. A CTA of the neck could better assess. Electronically Signed   By: Gilmore GORMAN Molt M.D.   On: 04/20/2024 22:00   EEG adult Result Date: 04/20/2024 Jacob Arlin KIDD, MD     04/20/2024  4:37 PM Patient Name: Jacob [name] MRN: [id number] Epilepsy Attending: Arlin [name] Jacob Referring Physician/Provider: Waddell Karna LABOR, NP Date: 04/20/2024 Duration: 26.17 mins Patient history: 73yo m with ams. EEG to evaluate for seizure Level of alertness: Awake, asleep AEDs during EEG study: None Technical aspects: This EEG study was done with scalp electrodes positioned according to the 10-20 International system of electrode placement. Electrical activity was reviewed with band pass filter of 1-70Hz , sensitivity of 7 uV/mm, display speed of 75mm/sec with a 60Hz  notched filter applied as appropriate. EEG data were recorded continuously and digitally stored.  Video monitoring was available and reviewed as appropriate. Description: The posterior dominant rhythm consists of 7 Hz activity of moderate voltage (25-35 uV) seen predominantly in posterior head regions, symmetric and reactive to eye opening and eye closing.  Sleep was characterized by vertex waves, sleep spindles (12 to 14 Hz), maximal frontocentral region.  Hyperventilation and photic stimulation were not performed.   ABNORMALITY - Background slow IMPRESSION: This study is suggestive of mild diffuse encephalopathy. No seizures or epileptiform discharges were seen throughout the recording. Arlin [name] Jacob   ECHOCARDIOGRAM COMPLETE Result Date: 04/20/2024    ECHOCARDIOGRAM REPORT   Patient Name:   Jacob [name] Date of Exam:  04/20/2024 Medical Rec #:  [id number]       Height:       74.0 in Accession #:    [id number]      Weight:       133.6 lb Date of Birth:  18-Mar-1951        BSA:          1.831 m Patient Age:    73 years        BP:           120/60 mmHg Patient Gender: M               HR:           75 bpm. Exam Location:  Inpatient Procedure: 2D Echo, Cardiac Doppler and Color Doppler (Both Spectral and Color            Flow Doppler were utilized during procedure). Indications:    Stroke  History:        Patient has prior history of Echocardiogram examinations, most                 recent 08/23/2022. CHF and Ischmeic Cardiomyopathy, CAD and                 Previous Myocardial Infarction, Prior CABG, COPD, Mitral Valve                 Disease; Risk Factors:Hypertension, Diabetes, Dyslipidemia,  Current Smoker and Family History of Coronary Artery Disease.                 Substance Abuse, End Stage Renal Disease, HIV, Acute Renal                 Failure.  Sonographer:    Logan Shove RDCS Referring Phys: [id number] CHRISTOPHER P DANFORD  Sonographer Comments: Image acquisition challenging due to patient behavioral factors. IMPRESSIONS  1. Left ventricular ejection fraction, by estimation, is 30 to 35%. The left ventricle has moderately decreased function. The left ventricle demonstrates regional wall motion abnormalities (see scoring diagram/findings for description). There is mild left ventricular hypertrophy. Left ventricular diastolic parameters are consistent with Grade II diastolic dysfunction (pseudonormalization). Elevated left atrial pressure.  2. Right ventricular systolic function is low normal. The right ventricular size is normal.  3. Left atrial size was severely dilated.  4. Right atrial size was mildly dilated.  5. The mitral valve is normal in structure. Mild mitral valve regurgitation. No evidence of mitral stenosis.  6. The aortic valve is tricuspid. Aortic valve regurgitation is not visualized. No aortic  stenosis is present. FINDINGS  Left Ventricle: Left ventricular ejection fraction, by estimation, is 30 to 35%. The left ventricle has moderately decreased function. The left ventricle demonstrates regional wall motion abnormalities. The left ventricular internal cavity size was normal in size. There is mild left ventricular hypertrophy. Left ventricular diastolic parameters are consistent with Grade II diastolic dysfunction (pseudonormalization). Elevated left atrial pressure.  LV Wall Scoring: The entire lateral wall, basal inferior segment, and apical inferior segment are hypokinetic. Right Ventricle: The right ventricular size is normal. Right vetricular wall thickness was not well visualized. Right ventricular systolic function is low normal. Left Atrium: Left atrial size was severely dilated. Right Atrium: Right atrial size was mildly dilated. Pericardium: There is no evidence of pericardial effusion. Mitral Valve: The mitral valve is normal in structure. Mild mitral valve regurgitation. No evidence of mitral valve stenosis. Tricuspid Valve: The tricuspid valve is not well visualized. Tricuspid valve regurgitation is not demonstrated. No evidence of tricuspid stenosis. Aortic Valve: The aortic valve is tricuspid. Aortic valve regurgitation is not visualized. No aortic stenosis is present. Aortic valve mean gradient measures 2.0 mmHg. Aortic valve peak gradient measures 4.6 mmHg. Aortic valve area, by VTI measures 3.20 cm. Pulmonic Valve: The pulmonic valve was not well visualized. Pulmonic valve regurgitation is mild. No evidence of pulmonic stenosis. Aorta: The aortic root and ascending aorta are structurally normal, with no evidence of dilitation. IAS/Shunts: The interatrial septum was not well visualized.  LEFT VENTRICLE PLAX 2D LVIDd:         6.00 cm      Diastology LVIDs:         4.50 cm      LV e' medial:    5.44 cm/s LV PW:         1.20 cm      LV E/e' medial:  18.8 LV IVS:        1.20 cm      LV e'  lateral:   5.77 cm/s LVOT diam:     2.10 cm      LV E/e' lateral: 17.7 LV SV:         70 LV SV Index:   38 LVOT Area:     3.46 cm  LV Volumes (MOD) LV vol d, MOD A2C: 230.0 ml LV vol d, MOD A4C: 237.0 ml LV vol s,  MOD A2C: 154.0 ml LV vol s, MOD A4C: 164.0 ml LV SV MOD A2C:     76.0 ml LV SV MOD A4C:     237.0 ml LV SV MOD BP:      74.2 ml RIGHT VENTRICLE RV Basal diam:  4.80 cm RV Mid diam:    2.90 cm RV S prime:     6.31 cm/s TAPSE (M-mode): 1.0 cm LEFT ATRIUM             Index        RIGHT ATRIUM           Index LA diam:        5.10 cm 2.79 cm/m   RA Area:     21.30 cm LA Vol (A2C):   95.3 ml 52.05 ml/m  RA Volume:   63.70 ml  34.79 ml/m LA Vol (A4C):   92.8 ml 50.68 ml/m LA Biplane Vol: 94.7 ml 51.72 ml/m  AORTIC VALVE AV Area (Vmax):    3.14 cm AV Area (Vmean):   2.76 cm AV Area (VTI):     3.20 cm AV Vmax:           107.00 cm/s AV Vmean:          70.400 cm/s AV VTI:            0.220 m AV Peak Grad:      4.6 mmHg AV Mean Grad:      2.0 mmHg LVOT Vmax:         96.90 cm/s LVOT Vmean:        56.100 cm/s LVOT VTI:          0.203 m LVOT/AV VTI ratio: 0.92  AORTA Ao Root diam: 2.40 cm Ao Asc diam:  2.40 cm MITRAL VALVE MV Area (PHT): 4.80 cm     SHUNTS MV Decel Time: 158 msec     Systemic VTI:  0.20 m MV E velocity: 102.00 cm/s  Systemic Diam: 2.10 cm MV A velocity: 78.30 cm/s MV E/A ratio:  1.30 Dorn Ross MD Electronically signed by Dorn Ross MD Signature Date/Time: 04/20/2024/1:07:23 PM    Final     Vitals:   04/21/24 0416 04/21/24 0541 04/21/24 0700 04/21/24 0859  BP: 114/71  (!) 113/96   Pulse: 93  89 83  Resp: 19  20 19   Temp: 98.4 F (36.9 C)     TempSrc: Oral     SpO2: 94%  96% 97%  Weight:  83 kg    Height:         PHYSICAL EXAM General:  Alert, well-nourished, well-developed patient in no acute distress Psych:  Mood and affect appropriate for situation CV: Regular rate and rhythm on monitor Respiratory:  Regular, unlabored respirations on room air GI: Abdomen soft  and nontender   NEURO:  Mental Status: alert and oriented to self. States president is Levy, year is correct. Unable to add change with out assistance. Repetition intact. 2/3 words  Speech/Language: speech is dysarthric but no aphasia.   Cranial Nerves:  II: PERRL. Visual fields full.  III, IV, VI: EOMI. Eyelids elevate symmetrically.  V: Sensation is intact to light touch and symmetrical to face.  VII: Face is symmetrical resting and smiling VIII: hearing intact to voice. IX, X: Palate elevates symmetrically. Phonation is normal.  KP:Dynloizm shrug 5/5. XII: tongue is midline without fasciculations. Motor: 5/5 strength to all muscle groups tested.  Tone: is normal and bulk is normal Sensation- Intact to light  touch bilaterally. Extinction absent to light touch to DSS.   Coordination: FTN intact bilaterally, HKS: no ataxia in BLE.No drift.  Gait- deferred    ASSESSMENT/PLAN  Mr. CHRISTINA WALDROP is a 73 y.o. male with history of  CHF, COPD, CAD, HIV,  ESRD on HD, HTN, HLD, MI, ischemic cardiomyopathy, peripheral neuropathy, DM, hx of SDH, syphilis, gout glaucoma, hx apical aneurysm hep C, hx GIB due to AVMs, still smoking, hx of drug abuse, PVD of RLE, chronic RLE wound who presented to [facility] for nonhealing wound and critical limb ischemia of right lower leg.  He underwent RLE distal SFA and popliteal artery angioplasty.  Due to confusion he had MRI brain which revealed Multiple small acute or early subacute infarcts in the left midbrain and right frontoparietal white matter.  NIH on Admission 3  Acute Ischemic Infarct:  3 separate lacunar strokes  Etiology: Small vessel disease MRI  Multiple small acute or early subacute infarcts in the left midbrain and right frontoparietal white matter. Given involvement of multiple vascular territories, consider an embolic etiology. MRA brain shows no large vessel stenosis.  MRA neck is severely motion degraded and suboptimal. Carotid ultrasound is  pending  EEG mild diffuse slowing.  No seizure activity 2D Echo EF 30-35%. Mild LVH with grade II diastolic dysfunction. LA severely dilated. RA mildly dilated  LDL 75 HgbA1c 7.7 B12 598 TSH 0.656 Ammonia 25 VTE prophylaxis - heparin  SQ aspirin  81 mg daily and clopidogrel  75 mg daily prior to admission, now on aspirin  81 mg daily and clopidogrel  75 mg daily due to peripheral vascular disease Therapy recommendations: Skilled nursing facility Disposition: Skilled nursing facility Hx of SDH 10/2020- Traumatic subdural hematoma   Hypertension CAd HTN Ischemic cardiomyopathy  Home meds:  coreg  3.125mg , Imdur  30 mg,  Stable Blood Pressure Goal: SBP less than 160   Hyperlipidemia Home meds:  atorvastatin  10mg , resumed in hospital LDL 75, goal < 70 Increase to 20mg   Continue statin at discharge  Diabetes type II UnControlled Home meds:  Tresiba , insulin   HgbA1c 7.7, goal < 7.0 CBGs SSI Recommend close follow-up with PCP for better DM control  Tobacco Abuse Patient smokes cigarettes 21.5 pack years       Ready to quit? No Nicotine  replacement therapy provided  Substance Abuse Hx of cocaine use   Dysphagia Patient has post-stroke dysphagia, SLP consulted    Diet   Diet regular Room service appropriate? Yes; Fluid consistency: Thin   Advance diet as tolerated  Wound care as ordered  Vascular following   COPD Home meds per primary team   HIV  Biktarvy - per primary team   Other Stroke Risk Factors ETOH use, alcohol level <10, advised to drink no more than 2 drink(s) a day Coronary artery disease Congestive heart failure   Other Active Problems Right leg wounds - followed by [facility] s/p Aortogram, arteriogram  of RLE with right distal SFA and popliteal artery angioplasty and shockwave lithotripsy, right proximal AT angioplasty with plain balloon and shockwave lithotripsy RLE is now maximally revascularized with signal vessel runoff through the AT brisk doppler AT  signal Left CF access without swelling or hematoma Right Calf wound care per WOC recommendations. Likely will need outpatient wound care follow up Continue Aspirin , Plavix , Statin Will arrange 1 month follow up in our office with ABI and RLE arterial duplex ESRD MWF per renal   Hospital day # 4   Patient presented with confusion and dysarthria following recent right lower extremity angioplasty  stenting for severe peripheral vascular disease.  MRI scan shows left midbrain and right periventricular white matter lacunar infarcts.  Continue dual antiplatelet therapy for his significant peripheral vascular disease and aggressive risk factor modification.   Check carotid ultrasound as MRa neck was quite motion degraded.  Recommend 30-day heart monitor at discharge for paroxysmal A-fib.  Patient will likely need to go to skilled nursing facility for rehabilitation.  Long discussion with patient and Dr. Jonel and answered questions.  Stroke team will sign off.  Kindly call for questions greater than 50% time during this 35-minute visit was spent in counseling and coordination of care discussion patient care team and answered questions.  Discussed with Dr. Jonel Eather Popp, MD Medical Director [facility] Pager: [phone] 04/21/2024 11:19 AM   To contact Stroke Continuity provider, please refer to [email or web address]. After hours, contact General Neurology

## 2024-04-21 NOTE — Progress Notes (Signed)
  Progress Note   Patient: Jacob [name] FMW:[id number] DOB: May 01, 1951 DOA: 04/17/2024     4 DOS: the patient was seen and examined on 04/21/2024 at 9:53AM      Brief hospital course: 73 y.o. M with ESRD on HD MWF, HIV, HTN, sCHF EF 40-45%, hx apical aneurysm, DM, CAD s/p CABG 2004 last PCI 2021, hep C, hx GIB due to AVMs, still smoking, PVD of RLE, chronic RLE wound who presented with nonhealing right medial calf wound.    Susbequently found to have stroke.     Assessment and Plan: * Ischemic leg wound Peripheral vascular disease - Continue aspirin  and Lipitor  - Daily wound care - Follow-up with vascular surgery in the office - Follow-up with wound care clinic  Stroke MRI brain showed scattered left MCA punctate infarcts - MRA showed no high grade intracranial stenoses - Carotid US  pending - Echocardiogram showed no cardiogenic source of embolism - Lipids ordered, on Lipitor  - Continue DAPT - Neurology f/u after discharge - Smoking cessation recommended - SNF - 30-day even monitor at discharge  ESRD (end stage renal disease) (HCC) - Consult nephrology for HD  Human immunodeficiency virus (HIV) disease (HCC) Controlled - Continue Biktarvy   History of GI bleeding due to AVMs On octreotide  monthly as an outpatient, hemoglobin appears stable, no reported bleeding   Acute metabolic encephalopathy Persists -Delirium precautions - SNF  Chronic systolic CHF (congestive heart failure) (HCC) LV aneurysm resolved EF down from 40% to 30-35% here - Hold carvedilol  - Volume status per HD  Chronic obstructive pulmonary disease (HCC) No evidence of flare - Continue Anoro  Anemia of chronic disease Hgb stable, no bleeding observed  DM (diabetes mellitus) (HCC) Glucose controlled - Hold Tresiba  given until diet started - Continue sliding scale corrections   Coronary artery disease with angina Hypertension CK normal BP normal - Continue Lipitor  (had been stopped  outpatient due to muscle aches) - Hold carvedilol  and Imdur              Subjective: Patient remains confused.  He has no leg pain.  He has had no fever, respiratory distress.  He remains confused.  No new swelling of the leg.     Physical Exam: BP 129/65 (BP Location: Left Arm)   Pulse 73   Temp 98.4 F (36.9 C) (Oral)   Resp 19   Ht 6' 2 (1.88 m)   Wt 83 kg   SpO2 97%   BMI 23.49 kg/m   Thin elderly male, lying in bed, appears well tired RRR, no murmurs, no peripheral edema Respiratory rate normal, lungs clear without rales or wheezes Abdomen soft, no tenderness palpation He is alert and responds to questions, but becomes sleepy after a while and dozes off during conversation, when asked he is able to state that he is at [facility], able to identify his sister, able to explain that he is here for the wound in his leg, and that he has had revascularization of his right leg  Data Reviewed: Basic metabolic panel shows hyperkalemia, suspect it was hemolyzed MRA shows no large grade Intracranial stenosis Carotid US  pending   Family Communication: Sister by phone    Disposition: Status is: Inpatient         Author: Lonni SHAUNNA Dalton, MD 04/21/2024 1:53 PM  For on call review [email or web address].

## 2024-04-21 NOTE — Progress Notes (Signed)
 [facility] Progress Note  Subjective:  Seen in room, no c/o's  Vitals:   04/20/24 2035 04/20/24 2336 04/21/24 0416 04/21/24 0541  BP: 132/65 (!) 135/55 114/71   Pulse: 85 91 93   Resp: 19 18 19    Temp: 98.1 F (36.7 C) 98.2 F (36.8 C) 98.4 F (36.9 C)   TempSrc: Oral Oral Oral   SpO2: 96% 97% 94%   Weight:    83 kg  Height:        Exam: Gen alert, no distress No jvd or bruits Chest occ crackles , mostly clear RRR no MRG Abd soft ntnd no mass or ascites +bs MS bilat feet are wrapped Ext trace PT edema, no other edema Neuro nonfocal    RFA AVG+bruit         OP HD: [facility] MWF 3h  B400   86kg  2K  R AVG  HEparin  none Last OP HD 6/25, post wt 86.6kg Gets to dry wt, good compliance Low BP's in the 70-90s on HD       Assessment/ Plan: Ischemic wound right foot: per pmd ESRD: on HD MWF. HD x1 here early am 6/27. Plan for next HD Monday.  Hypoxia/ volume: some mild edema on CXR related to vol overload, better clinically post HD x 1. Cont to lower vol w/ HD.  Anemia of esrd: Hb 11-13 here, follow.    Myer Fret MD  [facility] 04/20/2024, 5:30 PM  Recent Labs  Lab 04/18/24 0544 04/18/24 1210 04/19/24 0917 04/19/24 1209 04/20/24 1435  HGB 11.6*  --  12.6*  --   --   ALBUMIN  2.4*  --   --   --   --   CALCIUM  8.6*  --   --  9.1 9.6  PHOS  --  6.8*  --   --   --   CREATININE 7.61*  --   --  6.67* 9.16*  K 4.4  --   --  5.1 4.2   No results for input(s): IRON , TIBC, FERRITIN in the last 168 hours. Inpatient medications:  allopurinol   100 mg Oral Daily   aspirin  EC  81 mg Oral Daily   atorvastatin   20 mg Oral Daily   bictegravir-emtricitabine -tenofovir  AF  1 tablet Oral Daily   Chlorhexidine  Gluconate Cloth  6 each Topical Q0600   Chlorhexidine  Gluconate Cloth  6 each Topical Q0600   clopidogrel   75 mg Oral Q breakfast   feeding supplement  237 mL Oral BID BM   gabapentin   300 mg Oral QHS   heparin   5,000 Units Subcutaneous Q8H   insulin   aspart  0-5 Units Subcutaneous QHS   insulin  aspart  0-9 Units Subcutaneous TID WC   LORazepam   1 mg Oral Once   multivitamin  1 tablet Oral QHS   nutrition supplement (JUVEN)  1 packet Oral BID BM   sodium chloride  flush  3 mL Intravenous Q12H   sodium chloride  flush  3 mL Intravenous Q12H   umeclidinium-vilanterol  1 puff Inhalation Daily     acetaminophen  **OR** acetaminophen , hydrALAZINE , HYDROmorphone  (DILAUDID ) injection, labetalol , ondansetron  **OR** ondansetron  (ZOFRAN ) IV, mouth rinse, oxyCODONE , sodium chloride  flush, sodium chloride  flush

## 2024-04-21 NOTE — Progress Notes (Signed)
  [facility] Progress Note  Subjective:  Seen in room, no c/o's  Vitals:   04/21/24 0541 04/21/24 0700 04/21/24 0859 04/21/24 1340  BP:  (!) 113/96  129/65  Pulse:  89 83 73  Resp:  20 19 19   Temp:      TempSrc:      SpO2:  96% 97% 97%  Weight: 83 kg     Height:        Exam: Gen alert, no distress, remains lethargic No jvd or bruits Chest occ crackles , mostly clear RRR no MRG Abd soft ntnd no mass or ascites +bs MS bilat feet are wrapped Ext trace PT edema, no other edema Neuro nonfocal    RFA AVG+bruit         OP HD: [facility] MWF 3h  B400   86kg  2K  R AVG  HEparin  none Last OP HD 6/25, post wt 86.6kg Gets to dry wt, good compliance Low BP's in the 70-90s on HD       Assessment/ Plan: Ischemic wound right foot: per pmd AMS: neurology consulting, 3 separate lacunar strokes acute/ subacute, suspected embolic.  ESRD: on HD MWF. HD x1 here early am 6/27. Plan for next HD tomorrow.  BP: not on BP lowering meds, BP's stable low-normal.  Hyperkalemia: K+ 6.1 today but hemolyzed, per pmd they are repeating the lab.  Hypoxia/ volume: poss early edema on initial CXR. Better clinically post HD x 1. 2-3 kg under dry wt, lower at dc. Cont to lower vol w/ next HD as tol.  Anemia of esrd: Hb 11-13 here, follow.  CM EF 30-35% by echo here   Myer Fret MD  [facility] 04/20/2024, 5:30 PM  Recent Labs  Lab 04/18/24 0544 04/18/24 1210 04/19/24 0917 04/19/24 1209 04/20/24 1435 04/21/24 0737  HGB 11.6*  --  12.6*  --   --   --   ALBUMIN  2.4*  --   --   --   --   --   CALCIUM  8.6*  --   --    < > 9.6 8.9  PHOS  --  6.8*  --   --   --   --   CREATININE 7.61*  --   --    < > 9.16* 10.43*  K 4.4  --   --    < > 4.2 6.1*   < > = values in this interval not displayed.   No results for input(s): IRON , TIBC, FERRITIN in the last 168 hours. Inpatient medications:  allopurinol   100 mg Oral Daily   aspirin  EC  81 mg Oral Daily   atorvastatin   40 mg Oral Daily    bictegravir-emtricitabine -tenofovir  AF  1 tablet Oral Daily   Chlorhexidine  Gluconate Cloth  6 each Topical Q0600   Chlorhexidine  Gluconate Cloth  6 each Topical Q0600   clopidogrel   75 mg Oral Q breakfast   feeding supplement  237 mL Oral BID BM   heparin   5,000 Units Subcutaneous Q8H   insulin  aspart  0-5 Units Subcutaneous QHS   insulin  aspart  0-9 Units Subcutaneous TID WC   multivitamin  1 tablet Oral QHS   nutrition supplement (JUVEN)  1 packet Oral BID BM   sodium chloride  flush  3 mL Intravenous Q12H   sodium chloride  flush  3 mL Intravenous Q12H   umeclidinium-vilanterol  1 puff Inhalation Daily     acetaminophen  **OR** acetaminophen , hydrALAZINE , labetalol , ondansetron  **OR** ondansetron  (ZOFRAN ) IV, mouth rinse, oxyCODONE , sodium  chloride flush, sodium chloride  flush

## 2024-04-21 NOTE — Plan of Care (Signed)
  Problem: Education: Goal: Ability to describe self-care measures that may prevent or decrease complications (Diabetes Survival Skills Education) will improve Outcome: Not Progressing   Problem: Coping: Goal: Ability to adjust to condition or change in health will improve Outcome: Not Progressing   Problem: Fluid Volume: Goal: Ability to maintain a balanced intake and output will improve Outcome: Progressing   Problem: Nutritional: Goal: Maintenance of adequate nutrition will improve Outcome: Progressing

## 2024-04-22 ENCOUNTER — Encounter (HOSPITAL_COMMUNITY)

## 2024-04-22 DIAGNOSIS — N186 End stage renal disease: Secondary | ICD-10-CM | POA: Diagnosis not present

## 2024-04-22 DIAGNOSIS — I132 Hypertensive heart and chronic kidney disease with heart failure and with stage 5 chronic kidney disease, or end stage renal disease: Secondary | ICD-10-CM | POA: Diagnosis not present

## 2024-04-22 DIAGNOSIS — I6381 Other cerebral infarction due to occlusion or stenosis of small artery: Secondary | ICD-10-CM | POA: Diagnosis not present

## 2024-04-22 DIAGNOSIS — Z992 Dependence on renal dialysis: Secondary | ICD-10-CM | POA: Diagnosis not present

## 2024-04-22 DIAGNOSIS — I69391 Dysphagia following cerebral infarction: Secondary | ICD-10-CM | POA: Diagnosis not present

## 2024-04-22 DIAGNOSIS — S81801A Unspecified open wound, right lower leg, initial encounter: Secondary | ICD-10-CM | POA: Diagnosis not present

## 2024-04-22 DIAGNOSIS — I120 Hypertensive chronic kidney disease with stage 5 chronic kidney disease or end stage renal disease: Secondary | ICD-10-CM | POA: Diagnosis not present

## 2024-04-22 DIAGNOSIS — E1151 Type 2 diabetes mellitus with diabetic peripheral angiopathy without gangrene: Secondary | ICD-10-CM | POA: Diagnosis not present

## 2024-04-22 LAB — RENAL FUNCTION PANEL
Albumin: 2.2 g/dL — ABNORMAL LOW (ref 3.5–5.0)
Anion gap: 17 — ABNORMAL HIGH (ref 5–15)
BUN: 117 mg/dL — ABNORMAL HIGH (ref 8–23)
CO2: 27 mmol/L (ref 22–32)
Calcium: 9.1 mg/dL (ref 8.9–10.3)
Chloride: 92 mmol/L — ABNORMAL LOW (ref 98–111)
Creatinine, Ser: 12.1 mg/dL — ABNORMAL HIGH (ref 0.61–1.24)
GFR, Estimated: 4 mL/min — ABNORMAL LOW (ref >60)
Glucose, Bld: 155 mg/dL — ABNORMAL HIGH (ref 70–99)
Phosphorus: 7.2 mg/dL — ABNORMAL HIGH (ref 2.5–4.6)
Potassium: 5.5 mmol/L — ABNORMAL HIGH (ref 3.5–5.1)
Sodium: 136 mmol/L (ref 135–145)

## 2024-04-22 LAB — CBC
HCT: 37.1 % — ABNORMAL LOW (ref 39.0–52.0)
Hemoglobin: 12 g/dL — ABNORMAL LOW (ref 13.0–17.0)
MCH: 33 pg (ref 26.0–34.0)
MCHC: 32.3 g/dL (ref 30.0–36.0)
MCV: 101.9 fL — ABNORMAL HIGH (ref 80.0–100.0)
Platelets: 148 10*3/uL — ABNORMAL LOW (ref 150–400)
RBC: 3.64 MIL/uL — ABNORMAL LOW (ref 4.22–5.81)
RDW: 14.9 % (ref 11.5–15.5)
WBC: 9.2 10*3/uL (ref 4.0–10.5)
nRBC: 0 % (ref 0.0–0.2)

## 2024-04-22 LAB — GLUCOSE, CAPILLARY
Glucose-Capillary: 164 mg/dL — ABNORMAL HIGH (ref 70–99)
Glucose-Capillary: 122 mg/dL — ABNORMAL HIGH (ref 70–99)
Glucose-Capillary: <10 mg/dL — CL (ref 70–99)
Glucose-Capillary: 110 mg/dL — ABNORMAL HIGH (ref 70–99)

## 2024-04-22 MED ORDER — LIDOCAINE-PRILOCAINE 2.5-2.5 % EX CREA: 1.0000 | TOPICAL_CREAM | CUTANEOUS | Status: DC | PRN

## 2024-04-22 MED ORDER — HEPARIN SODIUM (PORCINE) 1000 UNIT/ML DIALYSIS: 1000.0000 [IU] | INTRAMUSCULAR | Status: DC | PRN

## 2024-04-22 MED ORDER — LIDOCAINE HCL (PF) 1 % IJ SOLN: 5.0000 mL | INTRAMUSCULAR | Status: DC | PRN

## 2024-04-22 MED ORDER — ANTICOAGULANT SODIUM CITRATE 4% (200MG/5ML) IV SOLN: 5.0000 mL | Status: DC | PRN

## 2024-04-22 MED ORDER — ALTEPLASE 2 MG IJ SOLR
2.0000 mg | Freq: Once | INTRAMUSCULAR | Status: DC | PRN
Start: 2024-04-22 — End: 2024-04-22

## 2024-04-22 MED ORDER — PENTAFLUOROPROP-TETRAFLUOROETH EX AERO: 1.0000 | INHALATION_SPRAY | CUTANEOUS | Status: DC | PRN

## 2024-04-22 MED ORDER — HEPARIN SODIUM (PORCINE) 1000 UNIT/ML DIALYSIS
1000.0000 [IU] | INTRAMUSCULAR | Status: DC | PRN
Start: 2024-04-22 — End: 2024-04-22

## 2024-04-22 MED ORDER — LIDOCAINE HCL (PF) 1 % IJ SOLN
5.0000 mL | INTRAMUSCULAR | Status: DC | PRN
Start: 2024-04-22 — End: 2024-04-22

## 2024-04-22 MED ORDER — OXYCODONE HCL 5 MG PO TABS
5.0000 mg | ORAL_TABLET | Freq: Four times a day (QID) | ORAL | Status: DC | PRN
  Administered 2024-04-22 – 2024-04-27 (×7): 5 mg via ORAL
  Filled 2024-04-22 (×9): qty 1

## 2024-04-22 MED ORDER — HALOPERIDOL 0.5 MG PO TABS
2.0000 mg | ORAL_TABLET | Freq: Four times a day (QID) | ORAL | Status: DC | PRN
  Administered 2024-04-22 – 2024-04-24 (×4): 2 mg via ORAL
  Filled 2024-04-22 (×4): qty 4

## 2024-04-22 MED ORDER — HALOPERIDOL LACTATE 5 MG/ML IJ SOLN: 1.0000 mg | Freq: Four times a day (QID) | INTRAMUSCULAR | Status: DC | PRN

## 2024-04-22 MED ORDER — QUETIAPINE FUMARATE 25 MG PO TABS
25.0000 mg | ORAL_TABLET | Freq: Every day | ORAL | Status: DC
  Administered 2024-04-22 – 2024-04-27 (×6): 25 mg via ORAL
  Filled 2024-04-22 (×6): qty 1

## 2024-04-22 MED ORDER — MIDODRINE HCL 5 MG PO TABS
5.0000 mg | ORAL_TABLET | Freq: Once | ORAL | Status: AC
  Administered 2024-04-22: 5 mg via ORAL
  Filled 2024-04-22: qty 1

## 2024-04-22 MED ORDER — ALTEPLASE 2 MG IJ SOLR: 2.0000 mg | Freq: Once | INTRAMUSCULAR | Status: DC | PRN

## 2024-04-22 NOTE — Progress Notes (Signed)
 Received patient in bed to unit.  Alert and oriented.  Informed consent signed and in chart.   TX duration:3  Patient didn't  tolerated well. BP constantly going down. Required a 300 ML saline bolus to stabilize.  Transported back to the room  Alert, without acute distress.  Hand-off given to patient's nurse.   Access used: AVG Access issues: n/a  Total UF removed: Medication(s) given: n/a      04/22/24 1600  Vitals  Temp (!) 97.5 F (36.4 C)  Temp Source Axillary  BP 94/62  MAP (mmHg) 73  Pulse Rate 100  ECG Heart Rate (!) 104  Resp 19  Oxygen Therapy  SpO2 94 %  O2 Device Room Air  During Treatment Monitoring  Blood Flow Rate (mL/min) 0 mL/min  Arterial Pressure (mmHg) -3.03 mmHg  Venous Pressure (mmHg) -2.22 mmHg  TMP (mmHg) 20.6 mmHg  Ultrafiltration Rate (mL/min) 0 mL/min  Dialysate Flow Rate (mL/min) 300 ml/min  Duration of HD Treatment -hour(s) 3 hour(s)  Cumulative Fluid Removed (mL) per Treatment  883.49  HD Safety Checks Performed Yes  Intra-Hemodialysis Comments Tx completed        Ollen [facility]

## 2024-04-22 NOTE — Progress Notes (Signed)
 [facility] Progress Note  Subjective:  Patient tired while eating breakfast but no complaints  Vitals:   04/21/24 2046 04/22/24 0621 04/22/24 0733 04/22/24 0820  BP: (!) 150/89 (!) 124/56  118/71  Pulse: 100 84  78  Resp: 12 18  18   Temp: 98.1 F (36.7 C) 97.8 F (36.6 C)  97.8 F (36.6 C)  TempSrc: Oral   Oral  SpO2: 96% 99% 98%   Weight:      Height:        Exam: Gen alert, no distress, remains lethargic No jvd or bruits Bilateral chest rise with no increased work of breathing Normal rate, no rub Abd soft ntnd no mass or ascites +bs MS bilat feet are wrapped Minimal edema in the lower extremities at the midshin Neuro lethargic but nonfocal    RFA AVG+bruit         OP HD: [facility] MWF 3h  B400   86kg  2K  R AVG  HEparin  none Last OP HD 6/25, post wt 86.6kg Gets to dry wt, good compliance Low BP's in the 70-90s on HD       Assessment/ Plan: Ischemic wound right foot: per pmd AMS: neurology consulting, 3 separate lacunar strokes acute/ subacute, suspected embolic.  ESRD: on HD MWF.  BP: not on BP lowering meds, BP's stable low-normal.  Hyperkalemia: Sinew with dialysis and use Lokelma  if needed Hypoxia/ volume: Chest is dry weight is stable Anemia of esrd: Hb 11-13 here, follow.  CM EF 30-35% by echo here    04/20/2024, 5:30 PM  Recent Labs  Lab 04/19/24 0917 04/19/24 1209 04/21/24 1718 04/22/24 0448 04/22/24 0805  HGB 12.6*  --   --  12.0*  --   ALBUMIN   --   --  2.4*  --  2.2*  CALCIUM   --    < > 9.5  --  9.1  PHOS  --   --  6.8*  --  7.2*  CREATININE  --    < > 10.97*  --  12.10*  K  --    < > 5.5*  --  5.5*   < > = values in this interval not displayed.   No results for input(s): IRON , TIBC, FERRITIN in the last 168 hours. Inpatient medications:  allopurinol   100 mg Oral Daily   aspirin  EC  81 mg Oral Daily   atorvastatin   40 mg Oral Daily   bictegravir-emtricitabine -tenofovir  AF  1 tablet Oral Daily   Chlorhexidine   Gluconate Cloth  6 each Topical Q0600   Chlorhexidine  Gluconate Cloth  6 each Topical Q0600   clopidogrel   75 mg Oral Q breakfast   feeding supplement  237 mL Oral BID BM   heparin   5,000 Units Subcutaneous Q8H   insulin  aspart  0-5 Units Subcutaneous QHS   insulin  aspart  0-9 Units Subcutaneous TID WC   multivitamin  1 tablet Oral QHS   nutrition supplement (JUVEN)  1 packet Oral BID BM   sodium chloride  flush  3 mL Intravenous Q12H   sodium chloride  flush  3 mL Intravenous Q12H   umeclidinium-vilanterol  1 puff Inhalation Daily    anticoagulant sodium citrate      anticoagulant sodium citrate       acetaminophen  **OR** acetaminophen , alteplase , alteplase , anticoagulant sodium citrate , anticoagulant sodium citrate , heparin , heparin , labetalol , lidocaine  (PF), lidocaine  (PF), lidocaine -prilocaine , lidocaine -prilocaine , ondansetron  **OR** ondansetron  (ZOFRAN ) IV, mouth rinse, pentafluoroprop-tetrafluoroeth, pentafluoroprop-tetrafluoroeth, sodium chloride  flush, sodium chloride  flush

## 2024-04-22 NOTE — Progress Notes (Signed)
 Ok to check HIV VL while here per Dr. Jonel in AM.  Sergio Batch, PharmD, BCIDP, AAHIVP, CPP Infectious Disease Pharmacist 04/22/2024 1:33 PM

## 2024-04-22 NOTE — Progress Notes (Signed)
 Pt receives out-pt HD at [facility] on MWF 6:20 am chair time. Will assist as needed.   Randine Mungo Renal Navigator [phone]

## 2024-04-22 NOTE — Progress Notes (Signed)
 STROKE TEAM PROGRESS NOTE    SIGNIFICANT HOSPITAL EVENTS 6/25- presenting from home   INTERIM HISTORY/SUBJECTIVE Seen in room today and patient is sleeping peacefully Carotid ultrasound Pending Neurological exam is unchanged.  Vital signs are stable.  . OBJECTIVE  CBC    Component Value Date/Time   WBC 9.2 04/22/2024 0448   RBC 3.64 (L) 04/22/2024 0448   HGB 12.0 (L) 04/22/2024 0448   HGB 9.3 (L) 08/09/2022 1625   HCT 37.1 (L) 04/22/2024 0448   HCT 28.6 (L) 08/09/2022 1625   PLT 148 (L) 04/22/2024 0448   PLT 129 (L) 08/09/2022 1625   MCV 101.9 (H) 04/22/2024 0448   MCV 104 (H) 08/09/2022 1625   MCH 33.0 04/22/2024 0448   MCHC 32.3 04/22/2024 0448   RDW 14.9 04/22/2024 0448   RDW 14.4 08/09/2022 1625   LYMPHSABS 2.1 04/17/2024 1615   LYMPHSABS 0.9 12/17/2019 1042   MONOABS 1.0 04/17/2024 1615   EOSABS 0.1 04/17/2024 1615   EOSABS 0.1 12/17/2019 1042   BASOSABS 0.1 04/17/2024 1615   BASOSABS 0.0 12/17/2019 1042    BMET    Component Value Date/Time   NA 136 04/22/2024 0805   NA 135 (A) 02/02/2023 0000   K 5.5 (H) 04/22/2024 0805   CL 92 (L) 04/22/2024 0805   CO2 27 04/22/2024 0805   GLUCOSE 155 (H) 04/22/2024 0805   BUN 117 (H) 04/22/2024 0805   BUN 59 (A) 02/02/2023 0000   CREATININE 12.10 (H) 04/22/2024 0805   CREATININE 6.53 (H) 02/24/2022 0927   CALCIUM  9.1 04/22/2024 0805   EGFR 6 (L) 08/09/2022 1625   GFRNONAA 4 (L) 04/22/2024 0805   GFRNONAA 10 (L) 01/12/2021 1139    IMAGING past 24 hours No results found.   Vitals:   04/22/24 1330 04/22/24 1400 04/22/24 1415 04/22/24 1430  BP: 109/61 (!) 95/55 (!) 83/62 (!) 88/55  Pulse: 80 85 87 97  Resp: 14 19 10 16   Temp:      TempSrc:      SpO2: 100% 96% 92% 93%  Weight:      Height:         PHYSICAL EXAM General:  Alert, well-nourished, well-developed patient in no acute distress Psych:  Mood and affect appropriate for situation CV: Regular rate and rhythm on monitor Respiratory:  Regular,  unlabored respirations on room air GI: Abdomen soft and nontender   NEURO:  Mental Status: alert and oriented to self. States president is Levy, year is correct. Unable to add change with out assistance. Repetition intact. 2/3 words  Speech/Language: speech is dysarthric but no aphasia.   Cranial Nerves:  II: PERRL. Visual fields full.  III, IV, VI: EOMI. Eyelids elevate symmetrically.  V: Sensation is intact to light touch and symmetrical to face.  VII: Face is symmetrical resting and smiling VIII: hearing intact to voice. IX, X: Palate elevates symmetrically. Phonation is normal.  KP:Dynloizm shrug 5/5. XII: tongue is midline without fasciculations. Motor: 5/5 strength to all muscle groups tested.  Tone: is normal and bulk is normal Sensation- Intact to light touch bilaterally. Extinction absent to light touch to DSS.   Coordination: FTN intact bilaterally, HKS: no ataxia in BLE.No drift.  Gait- deferred    ASSESSMENT/PLAN  Jacob [name] is a 73 y.o. male with history of  CHF, COPD, CAD, HIV,  ESRD on HD, HTN, HLD, MI, ischemic cardiomyopathy, peripheral neuropathy, DM, hx of SDH, syphilis, gout glaucoma, hx apical aneurysm hep C, hx GIB due to  AVMs, still smoking, hx of drug abuse, PVD of RLE, chronic RLE wound who presented to [facility] for nonhealing wound and critical limb ischemia of right lower leg.  He underwent RLE distal SFA and popliteal artery angioplasty.  Due to confusion he had MRI brain which revealed Multiple small acute or early subacute infarcts in the left midbrain and right frontoparietal white matter.  NIH on Admission 3  Acute Ischemic Infarct:  3 separate lacunar strokes  Etiology: Small vessel disease MRI  Multiple small acute or early subacute infarcts in the left midbrain and right frontoparietal white matter. Given involvement of multiple vascular territories, consider an embolic etiology. MRA brain shows no large vessel stenosis.  MRA neck is severely  motion degraded and suboptimal. Carotid ultrasound is pending  EEG mild diffuse slowing.  No seizure activity 2D Echo EF 30-35%. Mild LVH with grade II diastolic dysfunction. LA severely dilated. RA mildly dilated  LDL 75 HgbA1c 7.7 B12 598 TSH 0.656 Ammonia 25 VTE prophylaxis - heparin  SQ aspirin  81 mg daily and clopidogrel  75 mg daily prior to admission, now on aspirin  81 mg daily and clopidogrel  75 mg daily due to peripheral vascular disease Therapy recommendations: Skilled nursing facility Disposition: Skilled nursing facility Hx of SDH 10/2020- Traumatic subdural hematoma   Hypertension CAd HTN Ischemic cardiomyopathy  Home meds:  coreg  3.125mg , Imdur  30 mg,  Stable Blood Pressure Goal: SBP less than 160   Hyperlipidemia Home meds:  atorvastatin  10mg , resumed in hospital LDL 75, goal < 70 Increase to 20mg   Continue statin at discharge  Diabetes type II UnControlled Home meds:  Tresiba , insulin   HgbA1c 7.7, goal < 7.0 CBGs SSI Recommend close follow-up with PCP for better DM control  Tobacco Abuse Patient smokes cigarettes 21.5 pack years       Ready to quit? No Nicotine  replacement therapy provided  Substance Abuse Hx of cocaine use   Dysphagia Patient has post-stroke dysphagia, SLP consulted    Diet   Diet regular Room service appropriate? Yes; Fluid consistency: Thin   Advance diet as tolerated  Wound care as ordered  Vascular following   COPD Home meds per primary team   HIV  Biktarvy - per primary team   Other Stroke Risk Factors ETOH use, alcohol level <10, advised to drink no more than 2 drink(s) a day Coronary artery disease Congestive heart failure   Other Active Problems Right leg wounds - followed by [facility] s/p Aortogram, arteriogram  of RLE with right distal SFA and popliteal artery angioplasty and shockwave lithotripsy, right proximal AT angioplasty with plain balloon and shockwave lithotripsy RLE is now maximally revascularized with  signal vessel runoff through the AT brisk doppler AT signal Left CF access without swelling or hematoma Right Calf wound care per WOC recommendations. Likely will need outpatient wound care follow up Continue Aspirin , Plavix , Statin Will arrange 1 month follow up in our office with ABI and RLE arterial duplex ESRD MWF per renal   Hospital day # 5   Patient presented with confusion and dysarthria following recent right lower extremity angioplasty stenting for severe peripheral vascular disease.  MRI scan shows left midbrain and right periventricular white matter lacunar infarcts.  Continue dual antiplatelet therapy for his significant peripheral vascular disease and aggressive risk factor modification.   Check carotid ultrasound as MRa neck was quite motion degraded.  Recommend 30-day heart monitor at discharge for paroxysmal A-fib.  Patient will likely need to go to skilled nursing facility for rehabilitation.  Long discussion  with patient and Dr. Jonel and answered questions.  Stroke team will sign off.  Kindly call for questions greater than 50% time during this 25-minute visit was spent in counseling and coordination of care discussion patient care team and answered questions.  Discussed with Dr. Jonel Eather Popp, MD Medical Director [facility] Pager: [phone] 04/22/2024 2:47 PM   To contact Stroke Continuity provider, please refer to [email or web address]. After hours, contact General Neurology

## 2024-04-22 NOTE — Progress Notes (Signed)
 Pt screaming out help me, help me please throughout HD today. When you go over to him, he will calm down and talk to you, hold your hand, etc. No specific complaints. When you leave, he starts yelling again. Not ideal for outpatient HD at this time given confusion.  Izetta Boehringer, PA-C [employer] Pager [phone]

## 2024-04-22 NOTE — NC FL2 (Signed)
 [place]  MEDICAID FL2 LEVEL OF CARE FORM     IDENTIFICATION  Patient Name: Jacob [name] Birthdate: [birth year] Sex: male Admission Date (Current Location): 04/17/2024  [facility] and [place] Number:  Producer, television/film/video and Address:  [facility], [address] [place], [name] [address]      Provider Number: [id number]  Attending Physician Name and Address:  Jonel Lonni SQUIBB, *  Relative Name and Phone Number:       Current Level of Care: Hospital Recommended Level of Care: Skilled Nursing Facility Prior Approval Number:    Date Approved/Denied:   PASRR Number: [id number]  Discharge Plan: SNF    Current Diagnoses: Patient Active Problem List   Diagnosis Date Noted   Malnutrition of moderate degree 04/19/2024   Stroke (HCC) 04/19/2024   Acute metabolic encephalopathy 04/18/2024   History of hepatitis C 04/18/2024   History of GI bleeding due to AVMs 04/18/2024   Ischemic leg wound 04/17/2024   Leg wound, right 02/22/2024   Volume overload 01/23/2024   Fluid overload 06/14/2023   Encounter for smoking cessation counseling 03/07/2023   Diabetic infection of right foot (HCC) 07/18/2022   Chronic pain syndrome 03/29/2022   Chronic lower back pain    Thrombocytopenia (HCC)    Hypercalcemia 04/13/2021   Helicobacter pylori (H. pylori) infection 04/06/2021   Lumbar foraminal stenosis 03/30/2021   Gastric hemorrhage due to angiodysplasia of stomach    GI bleed 11/24/2020   COVID-19 virus infection 11/24/2020   Other mechanical complication of cranial or spinal infusion catheter, sequela 11/11/2020   Subdural hematoma (HCC) 11/06/2020   Fall at home, initial encounter 11/05/2020   Nicotine  dependence, cigarettes, uncomplicated 11/05/2020   Alcohol abuse 11/05/2020   Unspecified trochanteric fracture of left femur, initial encounter for closed fracture (HCC) 11/05/2020   Traumatic subdural hematoma, initial encounter (HCC) 11/05/2020    Fracture of metatarsal of right foot, closed 11/05/2020   Nondisplaced fracture of greater trochanter of left femur, initial encounter for closed fracture (HCC)    Uremia    Angiodysplasia of intestine with hemorrhage    Pruritus, unspecified 10/02/2020   Iron  deficiency anemia    Acute blood loss anemia    Acute on chronic anemia    Abnormal nuclear stress test 12/19/2019   Type 2 diabetes mellitus with diabetic neuropathy, unspecified (HCC) 08/07/2019   Allergy, unspecified, initial encounter 08/02/2019   Complication of vascular dialysis catheter 08/02/2019   Drug abuse counseling and surveillance of drug abuser 08/02/2019   Other specified coagulation defects (HCC) 08/02/2019   Secondary hyperparathyroidism of renal origin (HCC) 08/02/2019   Unspecified atrial fibrillation (HCC) 08/02/2019   ESRD (end stage renal disease) (HCC)    Cocaine abuse (HCC)    CHF exacerbation (HCC) 07/21/2019   Diabetic foot ulcer (HCC) 02/11/2019   AVM (arteriovenous malformation)    Melena 02/07/2019   Symptomatic anemia 02/06/2019   Hyperlipidemia associated with type 2 diabetes mellitus (HCC) 05/30/2018   Right foot ulcer (HCC) 02/28/2018   Chronic obstructive pulmonary disease (HCC) 02/28/2018   Anemia of chronic disease 11/20/2016   Chronic systolic CHF (congestive heart failure) (HCC) 11/10/2016   History of lumbar laminectomy for spinal cord decompression 02/29/2016   Type 2 diabetes mellitus with diabetic polyneuropathy, with long-term current use of insulin  (HCC) 06/17/2015   HTN (hypertension) 04/26/2015   DM (diabetes mellitus) (HCC) 04/26/2015   Ischemic cardiomyopathy 05/12/2014   Hyperkalemia 09/06/2013   DM II, diet controlled 12/30/2011  Bunion of left foot 11/25/2011   Bunion, right foot 11/25/2011   Polysubstance abuse (HCC) 07/28/2011   Hip fracture, left (HCC) 04/04/2011   Closed fracture of neck of femur (HCC) 04/04/2011   Insomnia 02/18/2011   Coronary artery disease  with angina 04/20/2009   Acute on chronic combined systolic and diastolic CHF (congestive heart failure) (HCC) 04/20/2009   Mixed hyperlipidemia 11/20/2006   Gout 11/20/2006   TOBACCO ABUSE 11/20/2006   Essential hypertension 11/20/2006   Human immunodeficiency virus (HIV) disease (HCC) 09/02/2006    Orientation RESPIRATION BLADDER Height & Weight     Self  Normal Continent Weight: 182 lb 15.7 oz (83 kg) Height:  6' 2 (188 cm)  BEHAVIORAL SYMPTOMS/MOOD NEUROLOGICAL BOWEL NUTRITION STATUS      Continent Diet (see dc summary)  AMBULATORY STATUS COMMUNICATION OF NEEDS Skin   Extensive Assist Verbally Other (Comment) (Vascular ulcer pretibial right 6/27)                       Personal Care Assistance Level of Assistance  Bathing, Feeding, Dressing Bathing Assistance: Limited assistance Feeding assistance: Limited assistance Dressing Assistance: Limited assistance     Functional Limitations Info  Sight, Hearing, Speech Sight Info: Impaired Hearing Info: Adequate Speech Info: Impaired (dysarthria)    SPECIAL CARE FACTORS FREQUENCY  PT (By licensed PT), OT (By licensed OT), Speech therapy     PT Frequency: 5x week OT Frequency: 5x week     Speech Therapy Frequency: 5x/wk      Contractures Contractures Info: Not present    Additional Factors Info  Code Status, Allergies, Psychotropic, Insulin  Sliding Scale Code Status Info: full Allergies Info: Augmentin  (Amoxicillin -pot Clavulanate)  Morphine  Sulfate  Mucinex  Fast-max  Amphetamines Psychotropic Info: gabapentin  (NEURONTIN ) capsule 300 mg  at bedtime Insulin  Sliding Scale Info: see dc summary       Current Medications (04/22/2024):  This is the current hospital active medication list Current Facility-Administered Medications  Medication Dose Route Frequency Provider Last Rate Last Admin   acetaminophen  (TYLENOL ) tablet 650 mg  650 mg Oral Q6H PRN Gretta Lonni PARAS, MD   650 mg at 04/21/24 1943   Or    acetaminophen  (TYLENOL ) suppository 650 mg  650 mg Rectal Q6H PRN Gretta Lonni PARAS, MD       allopurinol  (ZYLOPRIM ) tablet 100 mg  100 mg Oral Daily Gretta Lonni PARAS, MD   100 mg at 04/22/24 0820   aspirin  EC tablet 81 mg  81 mg Oral Daily Gretta Lonni PARAS, MD   81 mg at 04/22/24 0820   atorvastatin  (LIPITOR ) tablet 40 mg  40 mg Oral Daily Remi Pippin, NP   40 mg at 04/22/24 0820   bictegravir-emtricitabine -tenofovir  AF (BIKTARVY ) 50-200-25 MG per tablet 1 tablet  1 tablet Oral Daily Gretta Lonni PARAS, MD   1 tablet at 04/22/24 0820   Chlorhexidine  Gluconate Cloth 2 % PADS 6 each  6 each Topical Q0600 Gretta Lonni PARAS, MD       Chlorhexidine  Gluconate Cloth 2 % PADS 6 each  6 each Topical Q0600 Geralynn Charleston, MD   6 each at 04/22/24 0538   clopidogrel  (PLAVIX ) tablet 75 mg  75 mg Oral Q breakfast Gretta Lonni PARAS, MD   75 mg at 04/22/24 9178   feeding supplement (ENSURE PLUS HIGH PROTEIN) liquid 237 mL  237 mL Oral BID BM Danford, Lonni SQUIBB, MD   237 mL at 04/21/24 1722   heparin  injection 5,000 Units  5,000  Units Subcutaneous Q8H Gretta Lonni PARAS, MD   5,000 Units at 04/22/24 0533   insulin  aspart (novoLOG ) injection 0-5 Units  0-5 Units Subcutaneous QHS Gretta Lonni PARAS, MD   2 Units at 04/19/24 2140   insulin  aspart (novoLOG ) injection 0-9 Units  0-9 Units Subcutaneous TID WC Gretta Lonni PARAS, MD   2 Units at 04/22/24 9345   labetalol  (NORMODYNE ) injection 10 mg  10 mg Intravenous Q10 min PRN Gretta Lonni PARAS, MD       multivitamin (RENA-VIT) tablet 1 tablet  1 tablet Oral QHS Jonel Lonni SQUIBB, MD   1 tablet at 04/21/24 2137   nutrition supplement (JUVEN) (JUVEN) powder packet 1 packet  1 packet Oral BID BM Danford, Lonni SQUIBB, MD   1 packet at 04/22/24 0820   ondansetron  (ZOFRAN ) tablet 4 mg  4 mg Oral Q6H PRN Gretta Lonni PARAS, MD       Or   ondansetron  (ZOFRAN ) injection 4 mg  4 mg Intravenous Q6H PRN Gretta Lonni PARAS, MD        Oral care mouth rinse  15 mL Mouth Rinse PRN Danford, Lonni SQUIBB, MD       sodium chloride  flush (NS) 0.9 % injection 3 mL  3 mL Intravenous Q12H Gretta Lonni PARAS, MD   3 mL at 04/18/24 2021   sodium chloride  flush (NS) 0.9 % injection 3 mL  3 mL Intravenous PRN Gretta Lonni PARAS, MD       sodium chloride  flush (NS) 0.9 % injection 3 mL  3 mL Intravenous Q12H Gretta Lonni PARAS, MD   3 mL at 04/18/24 2021   sodium chloride  flush (NS) 0.9 % injection 3 mL  3 mL Intravenous PRN Gretta Lonni PARAS, MD       umeclidinium-vilanterol (ANORO ELLIPTA ) 62.5-25 MCG/ACT 1 puff  1 puff Inhalation Daily Gretta Lonni PARAS, MD   1 puff at 04/22/24 9266     Discharge Medications: Please see discharge summary for a list of discharge medications.  Relevant Imaging Results:  Relevant Lab Results:   Additional Information SSN [id number]   HD MWF [facility] [address].  [name], [name]

## 2024-04-22 NOTE — Progress Notes (Signed)
 Heart Failure Navigator Progress Note  Assessed for Heart & Vascular TOC clinic readiness.  Patient does not meet criteria due to ESRD on hemodialysis.   Navigator will sign off at this time.   Rhae Hammock, BSN, Scientist, clinical (histocompatibility and immunogenetics) Only

## 2024-04-22 NOTE — TOC Progression Note (Signed)
 Transition of Care [facility]) - Progression Note    Patient Details  Name: Jacob [name] MRN: [id number] Date of Birth: 1950/12/07  Transition of Care [facility] - ) CM/SW Contact  [name], [name] Phone Number: 04/22/2024, 10:45 AM  Clinical Narrative:   CSW completed referral and sent out, asked [facility] and [facility] to review, per family request. If either SNF can offer, patient's HD will need to be switched; CSW sent info to renal navigator, as well. CSW to follow.    Expected Discharge Plan: Skilled Nursing Facility Barriers to Discharge: Continued Medical Work up, English as a second language teacher, Other (must enter comment), Waiting for outpatient dialysis  Expected Discharge Plan and Services                                               Social Determinants of Health (SDOH) Interventions SDOH Screenings   Food Insecurity: Patient Declined (04/18/2024)  Housing: Unknown (02/27/2024)  Transportation Needs: No Transportation Needs (02/27/2024)  Utilities: Not At Risk (02/27/2024)  Depression (PHQ2-9): Low Risk  (03/05/2024)  Financial Resource Strain: Low Risk  (10/26/2020)  Physical Activity: Insufficiently Active (06/26/2018)  Social Connections: Somewhat Isolated (06/26/2018)  Stress: No Stress Concern Present (02/27/2024)  Tobacco Use: High Risk (04/17/2024)  Health Literacy: Adequate Health Literacy (02/27/2024)    Readmission Risk Interventions    06/15/2023    2:18 PM  Readmission Risk Prevention Plan  Transportation Screening Complete  Medication Review (RN Care Manager) Referral to Pharmacy  PCP or Specialist appointment within 3-5 days of discharge Complete  HRI or Home Care Consult Complete  SW Recovery Care/Counseling Consult Complete  Palliative Care Screening Not Applicable  Skilled Nursing Facility Not Applicable

## 2024-04-22 NOTE — Care Management Important Message (Signed)
 Important Message  Patient Details  Name: Jacob [name] MRN: [id number] Date of Birth: December 11, 1950   Important Message Given:  Yes - Medicare IM     Claretta Deed 04/22/2024, 2:47 PM

## 2024-04-22 NOTE — Progress Notes (Signed)
 OT Cancellation Note  Patient Details Name: ADDISON WHIDBEE MRN: [id number] DOB: 10/18/1951   Cancelled Treatment:    Reason Eval/Treat Not Completed: Patient at procedure or test/ unavailable (Pt at HD, OT will follow-up wiht pt as able)  04/22/2024  AB, OTR/L  Acute Rehabilitation Services  Office: [phone]   Curtistine JONETTA Das 04/22/2024, 12:56 PM

## 2024-04-22 NOTE — Plan of Care (Signed)
   Problem: Education: Goal: Ability to describe self-care measures that may prevent or decrease complications (Diabetes Survival Skills Education) will improve Outcome: Progressing Goal: Individualized Educational Video(s) Outcome: Progressing   Problem: Coping: Goal: Ability to adjust to condition or change in health will improve Outcome: Progressing

## 2024-04-22 NOTE — Progress Notes (Signed)
 Progress Note   Patient: Jacob [name] FMW:[id number] DOB: Feb 28, 1951 DOA: 04/17/2024     5 DOS: the patient was seen and examined on 04/22/2024 at 9:13AM      Brief hospital course: 73 y.o. M with ESRD on HD MWF, HIV, HTN, sCHF EF 40-45%, hx apical aneurysm, DM, CAD s/p CABG 2004 last PCI 2021, hep C, hx GIB due to AVMs, still smoking, PVD of RLE, chronic RLE wound who presented with nonhealing right medial calf wound.    Evidently, he had normal ABI in Feb, then developed a wound in March.  This progressed so [facility]  They treated it for a month, repeated ABI that now showed high grade stenosis in the RLE.  They referred him to Cardiology but he missed the appointment and so they recommended he come to the hospital because they were concerned if he does not get the consult by interventional cardiology soon he will end up losing his leg.  In the ER, patient was confused.  Cardiology evaluated the patient, recommended vascular surgery consult for angiogram tomorrow.        Assessment and Plan:  Ischemic leg wound Peripheral vascular disease - Continue aspirin  and Lipitor  - Daily wound care - Follow-up with vascular surgery in the office - Follow-up with wound care clinic   Stroke MRI brain showed scattered left MCA punctate infarcts - MRA showed no high grade intracranial stenoses - Carotid US  pending - Echocardiogram showed no cardiogenic source of embolism - Lipids ordered, on Lipitor  - Continue DAPT - Neurology f/u after discharge - Smoking cessation recommended - SNF - 30-day even monitor at discharge   ESRD (end stage renal disease) (HCC) - Consult nephrology for HD   Human immunodeficiency virus (HIV) disease (HCC) Controlled - Continue Biktarvy    History of GI bleeding due to AVMs On octreotide  monthly as an outpatient, hemoglobin appears stable, no reported bleeding    Acute metabolic encephalopathy Persists -Delirium  precautions - SNF   Chronic systolic CHF (congestive heart failure) (HCC) LV aneurysm resolved EF down from 40% to 30-35% here - Hold carvedilol  - Volume status per HD   Chronic obstructive pulmonary disease (HCC) No evidence of flare - Continue Anoro   Anemia of chronic disease Hgb stable, no bleeding observed   DM (diabetes mellitus) (HCC) Glucose controlled - Hold Tresiba  given until diet started - Continue sliding scale corrections     Coronary artery disease with angina Hypertension CK normal BP normal - Continue Lipitor  (had been stopped outpatient due to muscle aches) - Hold carvedilol  and Imdur         Subjective: Patient remains sleepy and somewhat confused.  This is intermittent.  And is reorientable.     Physical Exam: BP (!) 84/58 (BP Location: Left Arm)   Pulse (!) 105   Temp 98.2 F (36.8 C) (Oral)   Resp 16   Ht 6' 2 (1.88 m)   Wt 85.3 kg   SpO2 99%   BMI 24.14 kg/m   Thin elderly male, lying in bed, appears well tired RRR, no murmurs, no peripheral edema Respiratory rate normal, lungs clear without rales or wheezes Abdomen soft, no tenderness palpation Responds to questions but very sleepy.  Falls back asleep.  Then able to wake back up, states that he is at the hospital, what his name is, that it is June.  Also states inappropriately that he has to get up, seems impulsive and forgetful of  his limitations. Wound appears clean and healthy    Data Reviewed: Basic metabolic panel shows resolving hyperkalemia CBC unremarkable   Family Communication:     Disposition: Status is: Inpatient         Author: Lonni SHAUNNA Dalton, MD 04/22/2024 5:27 PM  For on call review [email or web address].

## 2024-04-22 NOTE — Evaluation (Signed)
 Speech Language Pathology Evaluation Patient Details Name: Jacob [name] MRN: [id number] DOB: [birth year] Today's Date: 04/22/2024 Time: 9069-9044 SLP Time Calculation (min) (ACUTE ONLY): 25 min  Problem List:  Patient Active Problem List   Diagnosis Date Noted   Malnutrition of moderate degree 04/19/2024   Stroke (HCC) 04/19/2024   Acute metabolic encephalopathy 04/18/2024   History of hepatitis C 04/18/2024   History of GI bleeding due to AVMs 04/18/2024   Ischemic leg wound 04/17/2024   Leg wound, right 02/22/2024   Volume overload 01/23/2024   Fluid overload 06/14/2023   Encounter for smoking cessation counseling 03/07/2023   Diabetic infection of right foot (HCC) 07/18/2022   Chronic pain syndrome 03/29/2022   Chronic lower back pain    Thrombocytopenia (HCC)    Hypercalcemia 04/13/2021   Helicobacter pylori (H. pylori) infection 04/06/2021   Lumbar foraminal stenosis 03/30/2021   Gastric hemorrhage due to angiodysplasia of stomach    GI bleed 11/24/2020   COVID-19 virus infection 11/24/2020   Other mechanical complication of cranial or spinal infusion catheter, sequela 11/11/2020   Subdural hematoma (HCC) 11/06/2020   Fall at home, initial encounter 11/05/2020   Nicotine  dependence, cigarettes, uncomplicated 11/05/2020   Alcohol abuse 11/05/2020   Unspecified trochanteric fracture of left femur, initial encounter for closed fracture (HCC) 11/05/2020   Traumatic subdural hematoma, initial encounter (HCC) 11/05/2020   Fracture of metatarsal of right foot, closed 11/05/2020   Nondisplaced fracture of greater trochanter of left femur, initial encounter for closed fracture (HCC)    Uremia    Angiodysplasia of intestine with hemorrhage    Pruritus, unspecified 10/02/2020   Iron  deficiency anemia    Acute blood loss anemia    Acute on chronic anemia    Abnormal nuclear stress test 12/19/2019   Type 2 diabetes mellitus with diabetic neuropathy, unspecified (HCC) 08/07/2019    Allergy, unspecified, initial encounter 08/02/2019   Complication of vascular dialysis catheter 08/02/2019   Drug abuse counseling and surveillance of drug abuser 08/02/2019   Other specified coagulation defects (HCC) 08/02/2019   Secondary hyperparathyroidism of renal origin (HCC) 08/02/2019   Unspecified atrial fibrillation (HCC) 08/02/2019   ESRD (end stage renal disease) (HCC)    Cocaine abuse (HCC)    CHF exacerbation (HCC) 07/21/2019   Diabetic foot ulcer (HCC) 02/11/2019   AVM (arteriovenous malformation)    Melena 02/07/2019   Symptomatic anemia 02/06/2019   Hyperlipidemia associated with type 2 diabetes mellitus (HCC) 05/30/2018   Right foot ulcer (HCC) 02/28/2018   Chronic obstructive pulmonary disease (HCC) 02/28/2018   Anemia of chronic disease 11/20/2016   Chronic systolic CHF (congestive heart failure) (HCC) 11/10/2016   History of lumbar laminectomy for spinal cord decompression 02/29/2016   Type 2 diabetes mellitus with diabetic polyneuropathy, with long-term current use of insulin  (HCC) 06/17/2015   HTN (hypertension) 04/26/2015   DM (diabetes mellitus) (HCC) 04/26/2015   Ischemic cardiomyopathy 05/12/2014   Hyperkalemia 09/06/2013   DM II, diet controlled 12/30/2011   Bunion of left foot 11/25/2011   Bunion, right foot 11/25/2011   Polysubstance abuse (HCC) 07/28/2011   Hip fracture, left (HCC) 04/04/2011   Closed fracture of neck of femur (HCC) 04/04/2011   Insomnia 02/18/2011   Coronary artery disease with angina 04/20/2009   Acute on chronic combined systolic and diastolic CHF (congestive heart failure) (HCC) 04/20/2009   Mixed hyperlipidemia 11/20/2006   Gout 11/20/2006   TOBACCO ABUSE 11/20/2006   Essential hypertension 11/20/2006   Human immunodeficiency virus (HIV) disease (HCC)  09/02/2006   Past Medical History:  Past Medical History:  Diagnosis Date   Acute respiratory failure (HCC) 03/01/2018   Anemia    Arthritis    all over; mostly knees  and back (02/28/2018)   Chronic combined systolic and diastolic CHF (congestive heart failure) (HCC)    Chronic lower back pain    stenosis   Community acquired pneumonia 09/06/2013   COPD (chronic obstructive pulmonary disease) (HCC)    Coronary atherosclerosis of native coronary artery    a. 02/2003 s/p CABG x 2 (VG->RI, VG->RPDA; b. 11/2019 PCI: LM nl, LAD 90d, D3 50, RI 100, LCX 100p, OM3 100 - fills via L->L collats from D2/dLAD, RCA 100p, VG->RPDA ok, VG->RI 95 (3.5x48 Synergy XD DES).   Drug abuse (HCC)    hx; tested for cocaine as recently as 2/08. says he is not using drugs now - avoided defib. for this reason    ESRD (end stage renal disease) (HCC)    [facility] M-W-F- [facility]   Fall at home 10/2020   GERD (gastroesophageal reflux disease)    takes OTC meds as needed   GI bleeding    a. 11/2019 EGD: angiodysplastic lesions w/ bleeding s/p argon plasma/clipping/epi inj. Multiple admissions for the same.   Glaucoma    uses eye drops daily   Hepatitis B 1968   tx'd w/isolation; caught it from toilet stools in gym   History of blood transfusion 03/01/2019   History of colon polyps    benign   History of gout    takes Allopurinol  daily as well as Colchicine -if needed (02/28/2018)   History of kidney stones    HTN (hypertension)    takes Coreg ,Imdur .and Apresoline  daily   Human immunodeficiency virus (HIV) disease (HCC) dx'd 1995   on Biktarvy  as of 12/2020.     Hyperlipidemia    Ischemic cardiomyopathy    a. 01/2019 Echo: EF 40-45%, diffuse HK, mild basal septal hypertrophy. Diast dysfxn. Nl RV size/fxn. Sev dil LA. Triv MR/TR/PR.   Muscle spasm    takes Zanaflex  as needed   Myocardial infarction (HCC) ~ 2004/2005   Nocturia    Peripheral neuropathy    takes gabapentin  daily   Pneumonia    at least twice (02/28/2018)   SDH (subdural hematoma) (HCC)    Syphilis, unspecified    Type II diabetes mellitus (HCC) 2004   Lantus  daily.Average fasting blood sugar 125-199   Wears  glasses    Wears partial dentures    Past Surgical History:  Past Surgical History:  Procedure Laterality Date   A/V FISTULAGRAM N/A 12/07/2023   Procedure: A/V Fistulagram;  Surgeon: Norine Manuelita LABOR, MD;  Location: [facility] INVASIVE CV LAB;  Service: Cardiovascular;  Laterality: N/A;   AV FISTULA PLACEMENT Left 08/02/2018   Procedure: ARTERIOVENOUS (AV) FISTULA CREATION  left arm radiocephlic;  Surgeon: Gretta Lonni PARAS, MD;  Location: [facility] OR;  Service: Vascular;  Laterality: Left;   AV FISTULA PLACEMENT Left 08/01/2019   Procedure: LEFT BRACHIOCEPHALIC ARTERIOVENOUS (AV) FISTULA CREATION;  Surgeon: Oris Krystal FALCON, MD;  Location: [facility] OR;  Service: Vascular;  Laterality: Left;   AV FISTULA PLACEMENT Right 04/12/2022   Procedure: RIGHT UPPER EXTREMITY ARTERIOVENOUS (AV)  GRAFT INSERTION USING GORE STRETCH 4-7 MM;  Surgeon: Eliza Lonni RAMAN, MD;  Location: [facility] OR;  Service: Vascular;  Laterality: Right;   BASCILIC VEIN TRANSPOSITION Left 10/03/2019   Procedure: BASILIC VEIN TRANSPOSITION LEFT SECOND STAGE;  Surgeon: Oris Krystal FALCON, MD;  Location: [facility] OR;  Service: Vascular;  Laterality: Left;   BIOPSY  01/25/2021   Procedure: BIOPSY;  Surgeon: Legrand Victory LITTIE DOUGLAS, MD;  Location: [facility] ENDOSCOPY;  Service: Gastroenterology;;   CARDIAC CATHETERIZATION  10/2002; 12/19/2004   thelbert 03/08/2011   COLONOSCOPY  2013   Dr.John Abran    CORONARY ARTERY BYPASS GRAFT  02/24/2003   CABG X2/notes 03/08/2011   CORONARY STENT INTERVENTION N/A 12/19/2019   Procedure: CORONARY STENT INTERVENTION;  Surgeon: Dann Candyce RAMAN, MD;  Location: [facility] INVASIVE CV LAB;  Service: Cardiovascular;  Laterality: N/A;   CORONARY ULTRASOUND/IVUS N/A 12/19/2019   Procedure: Intravascular Ultrasound/IVUS;  Surgeon: Dann Candyce RAMAN, MD;  Location: [facility] INVASIVE CV LAB;  Service: Cardiovascular;  Laterality: N/A;   ENTEROSCOPY N/A 01/25/2021   Procedure: ENTEROSCOPY;  Surgeon: Legrand Victory LITTIE DOUGLAS, MD;  Location: [facility] ENDOSCOPY;  Service:  Gastroenterology;  Laterality: N/A;   ENTEROSCOPY N/A 02/13/2021   Procedure: ENTEROSCOPY;  Surgeon: Charlanne Groom, MD;  Location: [facility] ENDOSCOPY;  Service: Endoscopy;  Laterality: N/A;   ENTEROSCOPY N/A 05/07/2021   Procedure: ENTEROSCOPY;  Surgeon: Leigh Elspeth SQUIBB, MD;  Location: [facility] ENDOSCOPY;  Service: Gastroenterology;  Laterality: N/A;   ESOPHAGOGASTRODUODENOSCOPY (EGD) WITH PROPOFOL  N/A 02/08/2019   Procedure: ESOPHAGOGASTRODUODENOSCOPY (EGD) WITH PROPOFOL ;  Surgeon: Teressa Toribio SQUIBB, MD;  Location: [facility] ENDOSCOPY;  Service: Gastroenterology;  Laterality: N/A;   ESOPHAGOGASTRODUODENOSCOPY (EGD) WITH PROPOFOL  N/A 12/22/2019   Procedure: ESOPHAGOGASTRODUODENOSCOPY (EGD) WITH PROPOFOL ;  Surgeon: [name] Sandor GAILS, DO;  Location: [facility] ENDOSCOPY;  Service: Gastroenterology;  Laterality: N/A;   ESOPHAGOGASTRODUODENOSCOPY (EGD) WITH PROPOFOL  N/A 10/19/2020   Procedure: ESOPHAGOGASTRODUODENOSCOPY (EGD) WITH PROPOFOL ;  Surgeon: Charlanne Groom, MD;  Location: [facility] ENDOSCOPY;  Service: Endoscopy;  Laterality: N/A;   ESOPHAGOGASTRODUODENOSCOPY (EGD) WITH PROPOFOL  N/A 12/22/2020   Procedure: ESOPHAGOGASTRODUODENOSCOPY (EGD) WITH PROPOFOL ;  Surgeon: Avram Lupita BRAVO, MD;  Location: [facility] ENDOSCOPY;  Service: Endoscopy;  Laterality: N/A;   ESOPHAGOGASTRODUODENOSCOPY (EGD) WITH PROPOFOL  N/A 01/09/2021   Procedure: ESOPHAGOGASTRODUODENOSCOPY (EGD) WITH PROPOFOL ;  Surgeon: Abran Norleen SAILOR, MD;  Location: [facility] ENDOSCOPY;  Service: Endoscopy;  Laterality: N/A;   HEMOSTASIS CLIP PLACEMENT  12/22/2019   Procedure: HEMOSTASIS CLIP PLACEMENT;  Surgeon: [name] Sandor GAILS, DO;  Location: [facility] ENDOSCOPY;  Service: Gastroenterology;;   HEMOSTASIS CLIP PLACEMENT  12/22/2020   Procedure: HEMOSTASIS CLIP PLACEMENT;  Surgeon: Avram Lupita BRAVO, MD;  Location: [facility] ENDOSCOPY;  Service: Endoscopy;;   HEMOSTASIS CONTROL  12/22/2020   Procedure: HEMOSTASIS CONTROL/hemospray;  Surgeon: Avram Lupita BRAVO, MD;  Location: [facility] ENDOSCOPY;  Service: Endoscopy;;    HOT HEMOSTASIS N/A 02/08/2019   Procedure: HOT HEMOSTASIS (ARGON PLASMA COAGULATION/BICAP);  Surgeon: Teressa Toribio SQUIBB, MD;  Location: [facility] ENDOSCOPY;  Service: Gastroenterology;  Laterality: N/A;   HOT HEMOSTASIS N/A 12/22/2019   Procedure: HOT HEMOSTASIS (ARGON PLASMA COAGULATION/BICAP);  Surgeon: [name] Sandor GAILS, DO;  Location: [facility] ENDOSCOPY;  Service: Gastroenterology;  Laterality: N/A;   HOT HEMOSTASIS N/A 10/19/2020   Procedure: HOT HEMOSTASIS (ARGON PLASMA COAGULATION/BICAP);  Surgeon: Charlanne Groom, MD;  Location: [facility] ENDOSCOPY;  Service: Endoscopy;  Laterality: N/A;   HOT HEMOSTASIS N/A 12/22/2020   Procedure: HOT HEMOSTASIS (ARGON PLASMA COAGULATION/BICAP);  Surgeon: Avram Lupita BRAVO, MD;  Location: [facility] ENDOSCOPY;  Service: Endoscopy;  Laterality: N/A;   HOT HEMOSTASIS N/A 01/09/2021   Procedure: HOT HEMOSTASIS (ARGON PLASMA COAGULATION/BICAP);  Surgeon: Abran Norleen SAILOR, MD;  Location: [facility] ENDOSCOPY;  Service: Endoscopy;  Laterality: N/A;   HOT HEMOSTASIS N/A 01/25/2021   Procedure: HOT HEMOSTASIS (ARGON PLASMA COAGULATION/BICAP);  Surgeon: Legrand Victory LITTIE DOUGLAS, MD;  Location[facility] ENDOSCOPY;  Service: Gastroenterology;  Laterality: N/A;   HOT HEMOSTASIS N/A 02/13/2021   Procedure: HOT HEMOSTASIS (ARGON PLASMA COAGULATION/BICAP);  Surgeon: Charlanne Groom, MD;  Location: [facility] ENDOSCOPY;  Service: Endoscopy;  Laterality: N/A;   HOT HEMOSTASIS N/A 05/07/2021   Procedure: HOT HEMOSTASIS (ARGON PLASMA COAGULATION/BICAP);  Surgeon: Leigh Elspeth SQUIBB, MD;  Location: [facility] ENDOSCOPY;  Service: Gastroenterology;  Laterality: N/A;   INSERTION OF DIALYSIS CATHETER N/A 02/08/2022   Procedure: INSERTION OF TUNNELED DIALYSIS CATHETER;  Surgeon: Eliza Lonni RAMAN, MD;  Location: [facility] OR;  Service: Vascular;  Laterality: N/A;   INTERTROCHANTERIC HIP FRACTURE SURGERY Left 11/2006   thelbert 03/08/2011   IR FLUORO GUIDE CV LINE LEFT  03/14/2022   IR FLUORO GUIDE CV LINE RIGHT  07/24/2019   IR FLUORO GUIDE CV LINE RIGHT   07/30/2019   IR US  GUIDE VASC ACCESS RIGHT  07/24/2019   IR US  GUIDE VASC ACCESS RIGHT  07/30/2019   LAPAROSCOPIC CHOLECYSTECTOMY  05/2006   LIGATION OF ARTERIOVENOUS  FISTULA Left 02/08/2022   Procedure: LIGATION OF LEFT ARTERIOVENOUS  FISTULA;  Surgeon: Eliza Lonni RAMAN, MD;  Location: [facility] OR;  Service: Vascular;  Laterality: Left;   LIGATION OF COMPETING BRANCHES OF ARTERIOVENOUS FISTULA Left 11/05/2018   Procedure: LIGATION OF COMPETING BRANCHES OF ARTERIOVENOUS FISTULA  LEFT  ARM;  Surgeon: Gretta Lonni PARAS, MD;  Location: [facility] OR;  Service: Vascular;  Laterality: Left;   LOWER EXTREMITY ANGIOGRAPHY N/A 04/18/2024   Procedure: Lower Extremity Angiography;  Surgeon: Gretta Lonni PARAS, MD;  Location: [facility] INVASIVE CV LAB;  Service: Cardiovascular;  Laterality: N/A;   LUMBAR LAMINECTOMY/DECOMPRESSION MICRODISCECTOMY N/A 02/29/2016   Procedure: Left L4-5 Lateral Recess Decompression, Removal Extradural Intraspinal Facet Cyst;  Surgeon: Oneil JAYSON Herald, MD;  Location: [facility] OR;  Service: Orthopedics;  Laterality: N/A;   METATARSAL HEAD EXCISION Right 07/20/2022   Procedure: Irragation and debridement of right foot wound with extraction of all necrotic soft tissue and bone;  Surgeon: Malvin Marsa FALCON, DPM;  Location: [facility] OR;  Service: Podiatry;  Laterality: Right;  Right 4th Met head and base of proximal phalanx resection   MULTIPLE TOOTH EXTRACTIONS     ORIF MANDIBULAR FRACTURE Left 08/13/2004   ORIF of left body fracture mandible with KLS Martin 2.3-mm six hole/notes 03/08/2011   RIGHT/LEFT HEART CATH AND CORONARY/GRAFT ANGIOGRAPHY N/A 12/19/2019   Procedure: RIGHT/LEFT HEART CATH AND CORONARY/GRAFT ANGIOGRAPHY;  Surgeon: Dann Candyce RAMAN, MD;  Location: [facility] INVASIVE CV LAB;  Service: Cardiovascular;  Laterality: N/A;   SCLEROTHERAPY  12/22/2019   Procedure: SCLEROTHERAPY;  Surgeon: [name] Sandor GAILS, DO;  Location: [facility] ENDOSCOPY;  Service: Gastroenterology;;   MATIAS  02/13/2021    Procedure: MATIAS;  Surgeon: Charlanne Groom, MD;  Location: [facility] ENDOSCOPY;  Service: Endoscopy;;   HPI:  73yo male admitted 04/17/24 with leg pain related to nonhealing right medial calf wound, confusion. PMH: ESRD on HD MWF, HIV, HLD, HTN, sCHF, apical aneurysm, DM, CAD, CABG (2004), CHF, COPD, hx drug abuse (2/08), GERD, hepB/C, GIB due to AVMs, smoker, PVF of RLE, chronic RLE wound, syphilis, SDH, MI, peripheral neuropathy. Cath lab for LE arteriogram, aortogram 04/18/24. MRI - multiple small acute/early subacute infarcts left midbrain and right frontotemporal white matter.   Assessment / Plan / Recommendation Clinical Impression  Pt seen at bedside for cognitive-linguistic evaluation. Baseline level of function unknown at this time, with no family present to confirm. Pt indicates having a college education. Assessment limited by poor participation. The Mini-Mental State Examination (MMSE)  was administered. Pt scored 7/27 (reading/writing tasks not administered at this time), indicating significant cognitive linguistic deficits. Points lost on orientation to time and place, delayed recall, attention (able to spell WORLD correctly, but not backwards). Pt named objects correctly, but was able to follow 1 step out of a 3-step verbal command. Unable to demonstrate abilty to repeat short phrase. Recommend continued skilled ST intervention acutely to maximize orientation and basic attention. Pt will benefit from continued ST intervention at discharge. RN/MD informed. ST will follow.    SLP Assessment  SLP Recommendation/Assessment: Patient needs continued Speech Language Pathology Services SLP Visit Diagnosis: Cognitive communication deficit (R41.841)     Assistance Recommended at Discharge  Frequent or constant Supervision/Assistance  Functional Status Assessment Patient has had a recent decline in their functional status and/or demonstrates limited ability to make significant improvements in  function in a reasonable and predictable amount of time (baseline function unknown)   Frequency and Duration min 1 x/week  2 weeks      SLP Evaluation Cognition  Overall Cognitive Status: No family/caregiver present to determine baseline cognitive functioning Orientation Level: Oriented to person;Disoriented to place;Disoriented to situation;Disoriented to time       Comprehension   Follows simple commands, answers simple questions.   Expression  Able to name common objects.  Oral / Motor  Oral Motor/Sensory Function Overall Oral Motor/Sensory Function: Mild impairment Facial ROM: Reduced right Facial Symmetry: Abnormal symmetry right Facial Strength: Reduced right Lingual ROM:  (generalized weakness) Lingual Symmetry:  (unable to assess - decreased protrusion) Lingual Strength: Reduced Motor Speech Overall Motor Speech: Appears within functional limits for tasks assessed Intelligibility: Intelligibility reduced Word: 75-100% accurate Phrase: 75-100% accurate            Karema Tocci B. Dory, MSP, CCC-SLP Speech Language Pathologist Office: [phone]  Dory Caprice Daring 04/22/2024, 10:04 AM

## 2024-04-23 ENCOUNTER — Ambulatory Visit (HOSPITAL_BASED_OUTPATIENT_CLINIC_OR_DEPARTMENT_OTHER): Admitting: Family Medicine

## 2024-04-23 ENCOUNTER — Ambulatory Visit (HOSPITAL_BASED_OUTPATIENT_CLINIC_OR_DEPARTMENT_OTHER): Admitting: Internal Medicine

## 2024-04-23 ENCOUNTER — Encounter (HOSPITAL_COMMUNITY)

## 2024-04-23 ENCOUNTER — Inpatient Hospital Stay (HOSPITAL_COMMUNITY)

## 2024-04-23 DIAGNOSIS — I639 Cerebral infarction, unspecified: Secondary | ICD-10-CM

## 2024-04-23 DIAGNOSIS — S81801A Unspecified open wound, right lower leg, initial encounter: Secondary | ICD-10-CM | POA: Diagnosis not present

## 2024-04-23 LAB — GLUCOSE, CAPILLARY
Glucose-Capillary: 179 mg/dL — ABNORMAL HIGH (ref 70–99)
Glucose-Capillary: 140 mg/dL — ABNORMAL HIGH (ref 70–99)
Glucose-Capillary: 148 mg/dL — ABNORMAL HIGH (ref 70–99)
Glucose-Capillary: 221 mg/dL — ABNORMAL HIGH (ref 70–99)

## 2024-04-23 LAB — BASIC METABOLIC PANEL WITH GFR
Anion gap: 16 — ABNORMAL HIGH (ref 5–15)
BUN: 80 mg/dL — ABNORMAL HIGH (ref 8–23)
CO2: 25 mmol/L (ref 22–32)
Calcium: 9.3 mg/dL (ref 8.9–10.3)
Chloride: 89 mmol/L — ABNORMAL LOW (ref 98–111)
Creatinine, Ser: 8.4 mg/dL — ABNORMAL HIGH (ref 0.61–1.24)
GFR, Estimated: 6 mL/min — ABNORMAL LOW (ref >60)
Glucose, Bld: 166 mg/dL — ABNORMAL HIGH (ref 70–99)
Potassium: 5.6 mmol/L — ABNORMAL HIGH (ref 3.5–5.1)
Sodium: 130 mmol/L — ABNORMAL LOW (ref 135–145)

## 2024-04-23 LAB — CBC
HCT: 33 % — ABNORMAL LOW (ref 39.0–52.0)
Hemoglobin: 10.8 g/dL — ABNORMAL LOW (ref 13.0–17.0)
MCH: 32.6 pg (ref 26.0–34.0)
MCHC: 32.7 g/dL (ref 30.0–36.0)
MCV: 99.7 fL (ref 80.0–100.0)
Platelets: 189 10*3/uL (ref 150–400)
RBC: 3.31 MIL/uL — ABNORMAL LOW (ref 4.22–5.81)
RDW: 14.9 % (ref 11.5–15.5)
WBC: 9.8 10*3/uL (ref 4.0–10.5)
nRBC: 0 % (ref 0.0–0.2)

## 2024-04-23 NOTE — Progress Notes (Signed)
 Carotid artery duplex has been completed. Preliminary results can be found in CV Proc through chart review.   04/23/24 1:20 PM Cathlyn Collet RVT

## 2024-04-23 NOTE — Progress Notes (Signed)
 Pt's case discussed with attending, nephrologist, renal PA, and CSW. Will assist as needed.   Randine Mungo Renal Navigator [phone]

## 2024-04-23 NOTE — Progress Notes (Addendum)
  [facility] Progress Note  Subjective:  Patient very disruptive and yelling out during dialysis required frequent redirection. Similar this morning with inability to voice particular thoughts  Vitals:   04/22/24 1644 04/22/24 1948 04/23/24 0704 04/23/24 0734  BP: (!) 84/58 111/63  110/65  Pulse:  100  88  Resp: 16 20  19   Temp: 98.2 F (36.8 C) 97.8 F (36.6 C)  98.1 F (36.7 C)  TempSrc: Oral Oral  Oral  SpO2: 99% 97%  97%  Weight:   84.8 kg   Height:        Exam: Gen alert, intermittent agitation and distress No jvd or bruits Bilateral chest rise with no increased work of breathing Normal rate, no rub Abd soft ntnd no mass or ascites +bs MS bilat feet are wrapped Minimal edema in the lower extremities at the midshin Neuro lethargic but nonfocal    RFA AVG+bruit         OP HD: [facility] MWF 3h  B400   86kg  2K  R AVG  HEparin  none Last OP HD 6/25, post wt 86.6kg Gets to dry wt, good compliance Low BP's in the 70-90s on HD       Assessment/ Plan: Ischemic wound right foot: per pmd AMS: neurology consulting, 3 separate lacunar strokes acute/ subacute, suspected embolic. However not improving. May limit dialysis candidacy. Will discuss with sister Aldona. May need a sitter. ESRD: on HD MWF. Wants to change units to [facility]. Waiting to discuss with sister and see if his AMS will improve BP: not on BP lowering meds, BP's stable low-normal.  Hyperkalemia: Control with dialysis and use Lokelma  if needed Hypoxia/ volume: UF with HD Anemia of esrd: Hb 11-13 here, follow.  CM EF 30-35% by echo here  ADDENDUM: I spent a while talking with Aldona about the patient's clinical status. She mentions an overall decline in the preceding months. We discussed that his mental status is limiting good quality dialysis. We discussed he may or may not improve with time. We will continue conversations.  Recent Labs  Lab 04/19/24 0917 04/19/24 1209 04/21/24 1718  04/22/24 0448 04/22/24 0805  HGB 12.6*  --   --  12.0*  --   ALBUMIN   --   --  2.4*  --  2.2*  CALCIUM   --    < > 9.5  --  9.1  PHOS  --   --  6.8*  --  7.2*  CREATININE  --    < > 10.97*  --  12.10*  K  --    < > 5.5*  --  5.5*   < > = values in this interval not displayed.   No results for input(s): IRON , TIBC, FERRITIN in the last 168 hours. Inpatient medications:  allopurinol   100 mg Oral Daily   aspirin  EC  81 mg Oral Daily   atorvastatin   40 mg Oral Daily   bictegravir-emtricitabine -tenofovir  AF  1 tablet Oral Daily   Chlorhexidine  Gluconate Cloth  6 each Topical Q0600   Chlorhexidine  Gluconate Cloth  6 each Topical Q0600   clopidogrel   75 mg Oral Q breakfast   feeding supplement  237 mL Oral BID BM   heparin   5,000 Units Subcutaneous Q8H   insulin  aspart  0-5 Units Subcutaneous QHS   insulin  aspart  0-9 Units Subcutaneous TID WC   multivitamin  1 tablet Oral QHS   nutrition supplement (JUVEN)  1 packet Oral BID BM   QUEtiapine  25 mg Oral QHS   sodium chloride  flush  3 mL Intravenous Q12H   sodium chloride  flush  3 mL Intravenous Q12H   umeclidinium-vilanterol  1 puff Inhalation Daily      acetaminophen  **OR** acetaminophen , haloperidol, labetalol , ondansetron  **OR** ondansetron  (ZOFRAN ) IV, mouth rinse, oxyCODONE , sodium chloride  flush, sodium chloride  flush

## 2024-04-23 NOTE — Progress Notes (Signed)
 Patient refused lab, cbg and heparin  SQ last night, education provided, day shift nurse updated, will continue to monitor.

## 2024-04-23 NOTE — Progress Notes (Signed)
 Progress Note   Patient: Jacob [name] FMW:[id number] DOB: 06-02-1951 DOA: 04/17/2024     6 DOS: the patient was seen and examined on 04/23/2024 at 9:490AM      Brief hospital course: 73 y.o. M with ESRD on HD MWF, HIV, HTN, sCHF EF 40-45%, hx apical aneurysm, DM, CAD s/p CABG 2004 last PCI 2021, hep C, hx GIB due to AVMs, still smoking, PVD of RLE, chronic RLE wound who presented with nonhealing right medial calf wound.    Hospitalization complicated by delirium.        Assessment and Plan: * Ischemic leg wound Peripheral vascular disease This was the initial presenting complaint.  Actually he was told to come to the ER by his [facility] due to missed appointments with Cardiology.  He had normal ABI in Feb, then developed a wound in March.  This progressed so he re-established with the [facility] in April.  They treated it for a month, repeated ABI that now showed high grade stenosis in the RLE.    He was referred to Cardiology but missed the appointment and so the [facility] recommended he come to the hospital for Cardiology consultation that way.  In the ER, Cardiology were consulted.  Critical limb ischemia ruled out, but they coordinated with Vascular surgery for angiogram.    Underwent angiography and angioplasty with shockwave lithotripsy on 6/26 HD#2 with Dr. Gretta.   - Continue aspirin  and Lipitor    Acute metabolic encephalopathy Uremia Encephalopathy has become the main issue.  At baseline, patient with possible dementia, but no diagnosed memory loss and living independently, managing own medicines and dialysis.    Here, he was very disoriented on admission, and had waxing and waning confusion/somnolence since.   TSH, ammonia, B12, LFTs, RPR all normal.  CK normal.  No prior COPD.  Obtained MRI that showed strokes, and suspect at this time that his delirium is from stroke superimposed on undiagnosed mild dementia.  (Uremia developed AFTER  confusion) - Check VBG given elevated anion gap - Standard delirium precautions: blinds open and lights on during day, TV off, minimize interruptions at night, glasses/hearing aids, PT/OT, avoiding Beers list medications     Stroke [facility]) MRI brain showed scattered left MCA punctate infarcts - MRA showed no high grade intracranial stenoses - Carotid US  showed no stenoses - Echocardiogram showed reduced EF but no cardiogenic source of embolism - Continue DAPT - Continue Lipitor   - Neurology f/u after discharge - Smoking cessation recommended - SNF - 30-day even monitor at discharge     ESRD (end stage renal disease) (HCC) - Consult nephrology for HD   Elevated anion gap Without metabolic acidosis. No ingestions reported or suspected.  Glucoses controlled and on insulin .  Anion gap elevated on admission, also pre-dated the uremia. - Check VBG - Check lactic acid   Human immunodeficiency virus (HIV) disease (HCC) HIV quant last Jan undetectable - Follow repeat HIV quant - Continue Biktarvy    History of GI bleeding due to AVMs On octreotide  monthly as an outpatient, hemoglobin appears stable, no reported bleeding    History of hepatitis C LFTs normal   Chronic obstructive pulmonary disease (HCC) No evidence of flare - Continue Anoro  Anemia of chronic disease Hgb stable, no bleeding observed  Chronic systolic CHF (congestive heart failure) (HCC) LV aneurysm resolved. EF down from 40% to 30-35% here - Hold carvedilol  given soft BP - Volume status per HD  DM (diabetes mellitus) (HCC) Glucose  controlled - Hold Tresiba  given until diet started - Continue sliding scale corrections   Coronary artery disease with angina Hypertension CK normal - Continue Lipitor  (had been stopped outpatient due to muscle aches) - Hold carvedilol  and Imdur  due to soft BP            Subjective: Still confused.  Was hollering during dialysis yesterday, somnolent and  confused.  No improvement with HD and remains confused today, very sleepy.  No vomiting, no seizures.  Too confused to provide focal symptoms.     Physical Exam: BP 105/85   Pulse 91   Temp 99 F (37.2 C) (Oral)   Resp 19   Ht 6' 2 (1.88 m)   Wt 84.8 kg   SpO2 97%   BMI 24.00 kg/m   Thin elderly male, lying in bed, appears well tired RRR, no murmurs, no peripheral edema Respiratory rate normal, lungs clear without rales or wheezes Abdomen soft, no tenderness palpation Responds to questions but very sleepy.  Falls back asleep.  Then able to wake back up, states that he is at the hospital, what his name is, that it is June.  Also states inappropriately that he has to get up, seems impulsive and forgetful of his limitations. Wound appears clean and healthy  Data Reviewed: CBC shows stable anemia BMP shows uremia, slightly better than yesterday, Cr elevated K remains high    Family Communication: Sister    Disposition: Status is: Inpatient 73 yo M with ESRD, sCHF EF 30% presented with ischemic leg wound.  Hospitalization complicated by encephalopathy  Currently will need SNF, but cannot proceed to rehab until encephalopathy improves        Author: Lonni SHAUNNA Dalton, MD 04/23/2024 4:44 PM  For on call review [email or web address].

## 2024-04-23 NOTE — Progress Notes (Signed)
 Patient refused vitals.

## 2024-04-23 NOTE — Progress Notes (Signed)
 Physical Therapy Treatment Patient Details Name: Jacob [name] MRN: [id number] DOB: May 03, 1951 Today's Date: 04/23/2024   History of Present Illness Pt is 73 yo presenting to [facility] ED on 6/25 due to nonhealing R medial calf wound. 6/26 s/p angioplasty. 6/27 Brain MRI showed multiple small acute or early subacute infarcts in L midbrain and R frontoparietal white matter. PMH - ESRD on HD, HIV, DM2 with neuropathy, HTN, and chronic diabetic ulcer of R foot.    PT Comments  Pt received in supine and appears confused with decreased command following. Pt grimaces with  touch to BLE and resists attempts assist BLE to EOB. Pt noted to flex BLE and cross RLE over L knee in supine without cues. Pt unable to follow cues for further exercises, so assisted pt with repositioning. Pt continues to benefit from PT services to progress toward functional mobility goals.    If plan is discharge home, recommend the following: Assistance with cooking/housework;Assist for transportation;Help with stairs or ramp for entrance;A lot of help with walking and/or transfers;A little help with bathing/dressing/bathroom   Can travel by private vehicle     No  Equipment Recommendations  BSC/3in1    Recommendations for Other Services       Precautions / Restrictions Precautions Precautions: Fall Recall of Precautions/Restrictions: Impaired Restrictions Weight Bearing Restrictions Per Provider Order: No     Mobility  Bed Mobility Overal bed mobility: Needs Assistance Bed Mobility: Rolling Rolling: Min assist         General bed mobility comments: min A for initiation. Pt does not follow commands to sit to EOB    Transfers                        Ambulation/Gait                   Stairs             Wheelchair Mobility     Tilt Bed    Modified Rankin (Stroke Patients Only) Modified Rankin (Stroke Patients Only) Pre-Morbid Rankin Score: No symptoms Modified Rankin: Moderately  severe disability     Balance       Sitting balance - Comments: nt                                    Communication Communication Communication: Impaired Factors Affecting Communication: Reduced clarity of speech  Cognition Arousal: Alert Behavior During Therapy: Flat affect   PT - Cognitive impairments: Awareness, Attention, Initiation, Sequencing, Problem solving, Safety/Judgement                       PT - Cognition Comments: Limited verbalizations and command following. Following commands: Impaired Following commands impaired: Follows one step commands inconsistently    Cueing Cueing Techniques: Verbal cues, Tactile cues, Visual cues  Exercises General Exercises - Lower Extremity Heel Slides: AROM, Supine, Both, 5 reps    General Comments        Pertinent Vitals/Pain Pain Assessment Pain Assessment: Faces Faces Pain Scale: Hurts even more Pain Location: BLE with touch Pain Descriptors / Indicators: Discomfort, Grimacing, Guarding Pain Intervention(s): Limited activity within patient's tolerance, Monitored during session, Repositioned     PT Goals (current goals can now be found in the care plan section) Acute Rehab PT Goals PT Goal Formulation: Patient unable to participate in goal setting Time For Goal  Achievement: 05/03/24 Progress towards PT goals: Progressing toward goals    Frequency    Min 3X/week       AM-PAC PT 6 Clicks Mobility   Outcome Measure  Help needed turning from your back to your side while in a flat bed without using bedrails?: A Little Help needed moving from lying on your back to sitting on the side of a flat bed without using bedrails?: A Lot Help needed moving to and from a bed to a chair (including a wheelchair)?: Total Help needed standing up from a chair using your arms (e.g., wheelchair or bedside chair)?: Total Help needed to walk in hospital room?: Total Help needed climbing 3-5 steps with a  railing? : Total 6 Click Score: 9    End of Session   Activity Tolerance: Other (comment) (impaired cognition) Patient left: in bed;with call bell/phone within reach;with bed alarm set Nurse Communication: Mobility status PT Visit Diagnosis: Other abnormalities of gait and mobility (R26.89)     Time: 8491-8479 PT Time Calculation (min) (ACUTE ONLY): 12 min  Charges:    $Therapeutic Activity: 8-22 mins PT General Charges $$ ACUTE PT VISIT: 1 Visit                    Darryle George, PTA Acute Rehabilitation Services Secure Chat Preferred  [phone]    Darryle George 04/23/2024, 4:22 PM

## 2024-04-23 NOTE — TOC Progression Note (Signed)
 Transition of Care [facility]) - Progression Note    Patient Details  Name: Jacob [name] MRN: [id number] Date of Birth: Feb 14, 1951  [facility] of [facility]) CM/SW Contact  [name], [name] Phone Number: 04/23/2024, 3:49 PM  Clinical Narrative:   CSW coordinated with medical team and renal navigator about medical barriers for patient and concern that he is not appropriate for outpatient HD at this time. CSW spoke with patient's sister to discuss and answer questions. Sister indicated that given everything going on with the patient, she no longer wants to move his HD clinic, she wants him to remain at his current location with staff that are familiar with him, and SNF in [place]. CSW to fax out referral once patient is closer to medically ready.    Expected Discharge Plan: Skilled Nursing Facility Barriers to Discharge: Continued Medical Work up, English as a second language teacher, Other (must enter comment), Waiting for outpatient dialysis  Expected Discharge Plan and Services                                               Social Determinants of Health (SDOH) Interventions SDOH Screenings   Food Insecurity: Patient Declined (04/18/2024)  Housing: Unknown (02/27/2024)  Transportation Needs: No Transportation Needs (02/27/2024)  Utilities: Not At Risk (02/27/2024)  Depression (PHQ2-9): Low Risk  (03/05/2024)  Financial Resource Strain: Low Risk  (10/26/2020)  Physical Activity: Insufficiently Active (06/26/2018)  Social Connections: Somewhat Isolated (06/26/2018)  Stress: No Stress Concern Present (02/27/2024)  Tobacco Use: High Risk (04/17/2024)  Health Literacy: Adequate Health Literacy (02/27/2024)    Readmission Risk Interventions    06/15/2023    2:18 PM  Readmission Risk Prevention Plan  Transportation Screening Complete  Medication Review (RN Care Manager) Referral to Pharmacy  PCP or Specialist appointment within 3-5 days of discharge Complete  HRI or Home Care Consult Complete   SW Recovery Care/Counseling Consult Complete  Palliative Care Screening Not Applicable  Skilled Nursing Facility Not Applicable

## 2024-04-24 DIAGNOSIS — K625 Hemorrhage of anus and rectum: Secondary | ICD-10-CM

## 2024-04-24 DIAGNOSIS — S81801A Unspecified open wound, right lower leg, initial encounter: Secondary | ICD-10-CM | POA: Diagnosis not present

## 2024-04-24 LAB — CBC
HCT: 34.9 % — ABNORMAL LOW (ref 39.0–52.0)
Hemoglobin: 11.3 g/dL — ABNORMAL LOW (ref 13.0–17.0)
MCH: 32 pg (ref 26.0–34.0)
MCHC: 32.4 g/dL (ref 30.0–36.0)
MCV: 98.9 fL (ref 80.0–100.0)
Platelets: 189 10*3/uL (ref 150–400)
RBC: 3.53 MIL/uL — ABNORMAL LOW (ref 4.22–5.81)
RDW: 14.8 % (ref 11.5–15.5)
WBC: 9 10*3/uL (ref 4.0–10.5)
nRBC: 0 % (ref 0.0–0.2)

## 2024-04-24 LAB — BLOOD GAS, VENOUS
Acid-Base Excess: 4.8 mmol/L — ABNORMAL HIGH (ref 0.0–2.0)
Bicarbonate: 30.4 mmol/L — ABNORMAL HIGH (ref 20.0–28.0)
O2 Saturation: 53.9 %
Patient temperature: 36.9
pCO2, Ven: 48 mmHg (ref 44–60)
pH, Ven: 7.41 (ref 7.25–7.43)
pO2, Ven: 35 mmHg (ref 32–45)

## 2024-04-24 LAB — BASIC METABOLIC PANEL WITH GFR
Anion gap: 18 — ABNORMAL HIGH (ref 5–15)
BUN: 98 mg/dL — ABNORMAL HIGH (ref 8–23)
CO2: 25 mmol/L (ref 22–32)
Calcium: 9.6 mg/dL (ref 8.9–10.3)
Chloride: 90 mmol/L — ABNORMAL LOW (ref 98–111)
Creatinine, Ser: 9.59 mg/dL — ABNORMAL HIGH (ref 0.61–1.24)
GFR, Estimated: 5 mL/min — ABNORMAL LOW (ref >60)
Glucose, Bld: 116 mg/dL — ABNORMAL HIGH (ref 70–99)
Potassium: 6.2 mmol/L — ABNORMAL HIGH (ref 3.5–5.1)
Sodium: 133 mmol/L — ABNORMAL LOW (ref 135–145)

## 2024-04-24 LAB — CULTURE, BLOOD (ROUTINE X 2)
Culture: NO GROWTH
Culture: NO GROWTH

## 2024-04-24 LAB — GLUCOSE, CAPILLARY
Glucose-Capillary: 124 mg/dL — ABNORMAL HIGH (ref 70–99)
Glucose-Capillary: 149 mg/dL — ABNORMAL HIGH (ref 70–99)

## 2024-04-24 LAB — CK: Total CK: 93 U/L (ref 49–397)

## 2024-04-24 LAB — HIV-1 RNA QUANT-NO REFLEX-BLD
HIV 1 RNA Quant: <20 {copies}/mL
LOG10 HIV-1 RNA: UNDETERMINED {Log_copies}/mL

## 2024-04-24 LAB — LACTIC ACID, PLASMA: Lactic Acid, Venous: 1.5 mmol/L (ref 0.5–1.9)

## 2024-04-24 MED ORDER — SODIUM ZIRCONIUM CYCLOSILICATE 5 G PO PACK
5.0000 g | PACK | Freq: Every day | ORAL | Status: DC
  Administered 2024-04-25: 5 g via ORAL
  Filled 2024-04-24 (×3): qty 1

## 2024-04-24 NOTE — Plan of Care (Signed)
  Problem: Education: Goal: Ability to describe self-care measures that may prevent or decrease complications (Diabetes Survival Skills Education) will improve Outcome: Not Progressing Goal: Individualized Educational Video(s) Outcome: Not Progressing   Problem: Coping: Goal: Ability to adjust to condition or change in health will improve Outcome: Not Progressing   Problem: Fluid Volume: Goal: Ability to maintain a balanced intake and output will improve Outcome: Progressing

## 2024-04-24 NOTE — Procedures (Signed)
 Venous pressure 0, machine continues to alarm. Both needles have been checked, arterial and venous are pushing/pulling fine per 2 techs. Cartridge has already been changed twice. Notified Dr. Macel, would like us  to try again.

## 2024-04-24 NOTE — Consult Note (Signed)
 Consultation  Referring Provider: TR[facility]/ Gherge Primary Care Physician:  de [place], Quintin PARAS, MD Primary Gastroenterologist:  Dr.Armbruster  Reason for Consultation: Bloody bowel movement  HPI: Jacob [name] is a 73 y.o. male African-American male who was admitted on 04/17/2024 with right lower extremity pain in setting of a nonhealing wound.  He underwent arteriogram of the right lower extremity with right distal SFA and popliteal artery angioplasty and shockwave lithotripsy and right proximal anterior tibial angioplasty with plain balloon and shockwave lithotripsy on 04/18/2024.  He is now on aspirin  and Plavix . Patient was noted to be confused at the time of admission and with further workup was found to have had multiple acute embolic or early subacute infarcts in the left midbrain and right frontoparietal white matter.  Patient has multiple other serious comorbidities including end-stage renal disease for which she is on dialysis, HIV-on Biktarvy , history of hepatitis C, diabetes mellitus, heart failure with reduced EF 40 to 45%, apical aneurysm, and coronary artery disease status post remote CABG and then PCI in 2021. He also has history of recurrent GI bleeding secondary to AVMs and had undergone numerous EGDs and enteroscopy's in 2000 2021 in 2022 with finding of gastric and duodenal AVMs with each procedure treated with APC.  Last EGD was done in 2022. He has not had colonoscopy since 2013 at which time he was found to have moderate diverticulosis of the sigmoid colon and had a 2 mm polyp removed. He has been on long-acting octreotide  at home for management of the AVMs.  Patient was undergoing dialysis this morning and noted to have a good-sized grossly bloody bowel movement.  Patient's nurse says that he had 1 other bowel movement after return from dialysis that appeared dark brown and not grossly melenic and not with obvious blood.  He has some stool in the bed currently but will not  let me examine him or the stool. Patient currently unable to offer any helpful history and is agitated and irritable and does not want to be examined.  He has been hemodynamically stable today Labs today WBC 9.0/hemoglobin 11.3/hematocrit 34.9/MCV 98.9/platelets 189 Sodium 133/potassium 6.2/BUN 98/creatinine 9.59 Lactic acid and CK within normal limits  Hemoglobin was 12.9 on admission, 11.6 the following day.  Last abdominal imaging March 2025 CT abdomen and pelvis without contrast shows no visualized hepatic mass, status post cholecystectomy, pneumobilia consistent with prior sphincterotomy, diverticulosis seen mainly involving the sigmoid colon no evidence of diverticulitis   Past Medical History:  Diagnosis Date   Acute respiratory failure (HCC) 03/01/2018   Anemia    Arthritis    all over; mostly knees and back (02/28/2018)   Chronic combined systolic and diastolic CHF (congestive heart failure) (HCC)    Chronic lower back pain    stenosis   Community acquired pneumonia 09/06/2013   COPD (chronic obstructive pulmonary disease) (HCC)    Coronary atherosclerosis of native coronary artery    a. 02/2003 s/p CABG x 2 (VG->RI, VG->RPDA; b. 11/2019 PCI: LM nl, LAD 90d, D3 50, RI 100, LCX 100p, OM3 100 - fills via L->L collats from D2/dLAD, RCA 100p, VG->RPDA ok, VG->RI 95 (3.5x48 Synergy XD DES).   Drug abuse (HCC)    hx; tested for cocaine as recently as 2/08. says he is not using drugs now - avoided defib. for this reason    ESRD (end stage renal disease) (HCC)    [facility]emo M-W-F- [facility]   Fall at home 10/2020   GERD (gastroesophageal reflux  disease)    takes OTC meds as needed   GI bleeding    a. 11/2019 EGD: angiodysplastic lesions w/ bleeding s/p argon plasma/clipping/epi inj. Multiple admissions for the same.   Glaucoma    uses eye drops daily   Hepatitis B 1968   tx'd w/isolation; caught it from toilet stools in gym   History of blood transfusion 03/01/2019   History of colon  polyps    benign   History of gout    takes Allopurinol  daily as well as Colchicine -if needed (02/28/2018)   History of kidney stones    HTN (hypertension)    takes Coreg ,Imdur .and Apresoline  daily   Human immunodeficiency virus (HIV) disease (HCC) dx'd 1995   on Biktarvy  as of 12/2020.     Hyperlipidemia    Ischemic cardiomyopathy    a. 01/2019 Echo: EF 40-45%, diffuse HK, mild basal septal hypertrophy. Diast dysfxn. Nl RV size/fxn. Sev dil LA. Triv MR/TR/PR.   Muscle spasm    takes Zanaflex  as needed   Myocardial infarction (HCC) ~ 2004/2005   Nocturia    Peripheral neuropathy    takes gabapentin  daily   Pneumonia    at least twice (02/28/2018)   SDH (subdural hematoma) (HCC)    Syphilis, unspecified    Type II diabetes mellitus (HCC) 2004   Lantus  daily.Average fasting blood sugar 125-199   Wears glasses    Wears partial dentures     Past Surgical History:  Procedure Laterality Date   A/V FISTULAGRAM N/A 12/07/2023   Procedure: A/V Fistulagram;  Surgeon: Norine Manuelita LABOR, MD;  [facility]: [facility] INVASIVE CV LAB;  Service: Cardiovascular;  Laterality: N/A;   AV FISTULA PLACEMENT Left 08/02/2018   Procedure: ARTERIOVENOUS (AV) FISTULA CREATION  left arm radiocephlic;  Surgeon: Gretta Lonni PARAS, MD;  Location: [facility] OR;  Service: Vascular;  Laterality: Left;   AV FISTULA PLACEMENT Left 08/01/2019   Procedure: LEFT BRACHIOCEPHALIC ARTERIOVENOUS (AV) FISTULA CREATION;  Surgeon: Oris Krystal FALCON, MD;  Location: [facility] OR;  Service: Vascular;  Laterality: Left;   AV FISTULA PLACEMENT Right 04/12/2022   Procedure: RIGHT UPPER EXTREMITY ARTERIOVENOUS (AV)  GRAFT INSERTION USING GORE STRETCH 4-7 MM;  Surgeon: Eliza Lonni RAMAN, MD;  Location: [facility] OR;  Service: Vascular;  Laterality: Right;   BASCILIC VEIN TRANSPOSITION Left 10/03/2019   Procedure: BASILIC VEIN TRANSPOSITION LEFT SECOND STAGE;  Surgeon: Oris Krystal FALCON, MD;  Location: [facility] OR;  Service: Vascular;  Laterality: Left;   BIOPSY  01/25/2021    Procedure: BIOPSY;  Surgeon: Legrand Victory LITTIE DOUGLAS, MD;  Location: [facility] ENDOSCOPY;  Service: Gastroenterology;;   CARDIAC CATHETERIZATION  10/2002; 12/19/2004   thelbert 03/08/2011   COLONOSCOPY  2013   Dr.John Abran    CORONARY ARTERY BYPASS GRAFT  02/24/2003   CABG X2/notes 03/08/2011   CORONARY STENT INTERVENTION N/A 12/19/2019   Procedure: CORONARY STENT INTERVENTION;  Surgeon: Dann Candyce RAMAN, MD;  [facility];  Service: Cardiovascular;  Laterality: N/A;   CORONARY ULTRASOUND/IVUS N/A 12/19/2019   Procedure: Intravascular Ultrasound/IVUS;  Surgeon: Dann Candyce RAMAN, MD;  Location: [facility] INVASIVE CV LAB;  Service: Cardiovascular;  Laterality: N/A;   ENTEROSCOPY N/A 01/25/2021   Procedure: ENTEROSCOPY;  Surgeon: Legrand Victory LITTIE DOUGLAS, MD;  Location: [facility] ENDOSCOPY;  Service: Gastroenterology;  Laterality: N/A;   ENTEROSCOPY N/A 02/13/2021   Procedure: ENTEROSCOPY;  Surgeon: Charlanne Groom, MD;  Location: [facility] ENDOSCOPY;  Service: Endoscopy;  Laterality: N/A;   ENTEROSCOPY N/A 05/07/2021   Procedure: ENTEROSCOPY;  Surgeon: Leigh Standing  P, MD;  Location: [facility] ENDOSCOPY;  Service: Gastroenterology;  Laterality: N/A;   ESOPHAGOGASTRODUODENOSCOPY (EGD) WITH PROPOFOL  N/A 02/08/2019   Procedure: ESOPHAGOGASTRODUODENOSCOPY (EGD) WITH PROPOFOL ;  Surgeon: Teressa Toribio SQUIBB, MD;  Location: [facility] ENDOSCOPY;  Service: Gastroenterology;  Laterality: N/A;   ESOPHAGOGASTRODUODENOSCOPY (EGD) WITH PROPOFOL  N/A 12/22/2019   Procedure: ESOPHAGOGASTRODUODENOSCOPY (EGD) WITH PROPOFOL ;  Surgeon: [name] Sandor GAILS, DO;  Location: [facility] ENDOSCOPY;  Service: Gastroenterology;  Laterality: N/A;   ESOPHAGOGASTRODUODENOSCOPY (EGD) WITH PROPOFOL  N/A 10/19/2020   Procedure: ESOPHAGOGASTRODUODENOSCOPY (EGD) WITH PROPOFOL ;  Surgeon: Charlanne Groom, MD;  Location: [facility] ENDOSCOPY;  Service: Endoscopy;  Laterality: N/A;   ESOPHAGOGASTRODUODENOSCOPY (EGD) WITH PROPOFOL  N/A 12/22/2020   Procedure: ESOPHAGOGASTRODUODENOSCOPY (EGD)  WITH PROPOFOL ;  Surgeon: Avram Lupita BRAVO, MD;  Location: [facility] ENDOSCOPY;  Service: Endoscopy;  Laterality: N/A;   ESOPHAGOGASTRODUODENOSCOPY (EGD) WITH PROPOFOL  N/A 01/09/2021   Procedure: ESOPHAGOGASTRODUODENOSCOPY (EGD) WITH PROPOFOL ;  Surgeon: Abran Norleen SAILOR, MD;  Location: [facility] ENDOSCOPY;  Service: Endoscopy;  Laterality: N/A;   HEMOSTASIS CLIP PLACEMENT  12/22/2019   Procedure: HEMOSTASIS CLIP PLACEMENT;  Surgeon: [name] Sandor GAILS, DO;  Location: [facility] ENDOSCOPY;  Service: Gastroenterology;;   HEMOSTASIS CLIP PLACEMENT  12/22/2020   Procedure: HEMOSTASIS CLIP PLACEMENT;  Surgeon: Avram Lupita BRAVO, MD;  Location: [facility] ENDOSCOPY;  Service: Endoscopy;;   HEMOSTASIS CONTROL  12/22/2020   Procedure: HEMOSTASIS CONTROL/hemospray;  Surgeon: Avram Lupita BRAVO, MD;  Location: [facility] ENDOSCOPY;  Service: Endoscopy;;   HOT HEMOSTASIS N/A 02/08/2019   Procedure: HOT HEMOSTASIS (ARGON PLASMA COAGULATION/BICAP);  Surgeon: Teressa Toribio SQUIBB, MD;  Location: [facility] ENDOSCOPY;  Service: Gastroenterology;  Laterality: N/A;   HOT HEMOSTASIS N/A 12/22/2019   Procedure: HOT HEMOSTASIS (ARGON PLASMA COAGULATION/BICAP);  Surgeon: [name] Sandor GAILS, DO;  Location: [facility] ENDOSCOPY;  Service: Gastroenterology;  Laterality: N/A;   HOT HEMOSTASIS N/A 10/19/2020   Procedure: HOT HEMOSTASIS (ARGON PLASMA COAGULATION/BICAP);  Surgeon: Charlanne Groom, MD;  Location: [facility] ENDOSCOPY;  Service: Endoscopy;  Laterality: N/A;   HOT HEMOSTASIS N/A 12/22/2020   Procedure: HOT HEMOSTASIS (ARGON PLASMA COAGULATION/BICAP);  Surgeon: Avram Lupita BRAVO, MD;  Location: [facility] ENDOSCOPY;  Service: Endoscopy;  Laterality: N/A;   HOT HEMOSTASIS N/A 01/09/2021   Procedure: HOT HEMOSTASIS (ARGON PLASMA COAGULATION/BICAP);  Surgeon: Abran Norleen SAILOR, MD;  Location: [facility] ENDOSCOPY;  Service: Endoscopy;  Laterality: N/A;   HOT HEMOSTASIS N/A 01/25/2021   Procedure: HOT HEMOSTASIS (ARGON PLASMA COAGULATION/BICAP);  Surgeon: Legrand Victory LITTIE DOUGLAS, MD;  Location: [facility] ENDOSCOPY;  Service:  Gastroenterology;  Laterality: N/A;   HOT HEMOSTASIS N/A 02/13/2021   Procedure: HOT HEMOSTASIS (ARGON PLASMA COAGULATION/BICAP);  Surgeon: Charlanne Groom, MD;  Location: [facility] ENDOSCOPY;  Service: Endoscopy;  Laterality: N/A;   HOT HEMOSTASIS N/A 05/07/2021   Procedure: HOT HEMOSTASIS (ARGON PLASMA COAGULATION/BICAP);  Surgeon: Leigh Elspeth SQUIBB, MD;  Location: [facility] ENDOSCOPY;  Service: Gastroenterology;  Laterality: N/A;   INSERTION OF DIALYSIS CATHETER N/A 02/08/2022   Procedure: INSERTION OF TUNNELED DIALYSIS CATHETER;  Surgeon: Eliza Lonni RAMAN, MD;  Location: [facility] OR;  Service: Vascular;  Laterality: N/A;   INTERTROCHANTERIC HIP FRACTURE SURGERY Left 11/2006   thelbert 03/08/2011   IR FLUORO GUIDE CV LINE LEFT  03/14/2022   IR FLUORO GUIDE CV LINE RIGHT  07/24/2019   IR FLUORO GUIDE CV LINE RIGHT  07/30/2019   IR US  GUIDE VASC ACCESS RIGHT  07/24/2019   IR US  GUIDE VASC ACCESS RIGHT  07/30/2019   LAPAROSCOPIC CHOLECYSTECTOMY  05/2006   LIGATION OF ARTERIOVENOUS  FISTULA Left 02/08/2022   Procedure: LIGATION OF LEFT ARTERIOVENOUS  FISTULA;  Surgeon: Eliza Lonni RAMAN, MD;  Location: [facility] OR;  Service: Vascular;  Laterality: Left;   LIGATION OF COMPETING BRANCHES OF ARTERIOVENOUS FISTULA Left 11/05/2018   Procedure: LIGATION OF COMPETING BRANCHES OF ARTERIOVENOUS FISTULA  LEFT  ARM;  Surgeon: Gretta Lonni PARAS, MD;  Location: [facility] OR;  Service: Vascular;  Laterality: Left;   LOWER EXTREMITY ANGIOGRAPHY N/A 04/18/2024   Procedure: Lower Extremity Angiography;  Surgeon: Gretta Lonni PARAS, MD;  Location: [facility] INVASIVE CV LAB;  Service: Cardiovascular;  Laterality: N/A;   LUMBAR LAMINECTOMY/DECOMPRESSION MICRODISCECTOMY N/A 02/29/2016   Procedure: Left L4-5 Lateral Recess Decompression, Removal Extradural Intraspinal Facet Cyst;  Surgeon: Oneil JAYSON Herald, MD;  Location: [facility] OR;  Service: Orthopedics;  Laterality: N/A;   METATARSAL HEAD EXCISION Right 07/20/2022   Procedure: Irragation and debridement of  right foot wound with extraction of all necrotic soft tissue and bone;  Surgeon: Malvin Marsa FALCON, DPM;  Location: [facility] OR;  Service: Podiatry;  Laterality: Right;  Right 4th Met head and base of proximal phalanx resection   MULTIPLE TOOTH EXTRACTIONS     ORIF MANDIBULAR FRACTURE Left 08/13/2004   ORIF of left body fracture mandible with KLS Martin 2.3-mm six hole/notes 03/08/2011   RIGHT/LEFT HEART CATH AND CORONARY/GRAFT ANGIOGRAPHY N/A 12/19/2019   Procedure: RIGHT/LEFT HEART CATH AND CORONARY/GRAFT ANGIOGRAPHY;  Surgeon: Dann Candyce RAMAN, MD;  Location: [facility] INVASIVE CV LAB;  Service: Cardiovascular;  Laterality: N/A;   SCLEROTHERAPY  12/22/2019   Procedure: SCLEROTHERAPY;  Surgeon: [name] Sandor GAILS, DO;  Location: [facility] ENDOSCOPY;  Service: Gastroenterology;;   MATIAS  02/13/2021   Procedure: MATIAS;  Surgeon: Charlanne Groom, MD;  Location: [facility] ENDOSCOPY;  Service: Endoscopy;;    Prior to Admission medications   Medication Sig Start Date End Date Taking? Authorizing Provider  gabapentin  (NEURONTIN ) 300 MG capsule Take 1 capsule (300 mg total) by mouth in the morning and at bedtime. Patient is taking 1 tablet in the early evening after dialysis and 1 tablet at bedtime 03/01/24  Yes de [place], Quintin PARAS, MD  octreotide  (SANDOSTATIN  LAR DEPOT) 10 MG injection Inject 10 mg into the muscle every 28 days. 03/21/24  Yes McMichael, Bayley M, PA-C  acetaminophen  (TYLENOL ) 650 MG CR tablet Take 1,300 mg by mouth 3 (three) times daily.    [provider]  albuterol  (PROVENTIL ) (2.5 MG/3ML) 0.083% nebulizer solution Take 3 mLs (2.5 mg total) by nebulization every 6 (six) hours as needed for wheezing or shortness of breath. 06/20/23 06/19/24  [name], Dinah C, NP  allopurinol  (ZYLOPRIM ) 100 MG tablet TAKE 1 TABLET BY MOUTH DAILY 11/01/23   Sherlynn Madden, MD  atorvastatin  (LIPITOR ) 10 MG tablet TAKE 1 TABLET(10 MG) BY MOUTH DAILY. 11/23/23   Wyn Jackee VEAR Mickey., NP  B-D UF III MINI PEN  NEEDLES 31G X 5 MM MISC Inject 1 Box into the skin 4 (four) times daily. 04/15/24   de [place], Raymond J, MD  BIKTARVY  [phone] MG TABS tablet TAKE 1 TABLET BY MOUTH 1 TIME A DAY 01/04/24   Efrain Lamar ORN, MD  Calcium  Acetate 667 MG TABS Take 667-2,001 mg by mouth See admin instructions. Take 2001 mg by mouth three times a day with meals and 667 mg with each snack 01/20/21   [provider]  carvedilol  (COREG ) 3.125 MG tablet TAKE 1 TABLET(3.125 MG) BY MOUTH TWICE DAILY WITH A MEAL 11/28/23   Wyn Jackee VEAR Mickey., NP  diclofenac  Sodium (VOLTAREN ) 1 % GEL Apply 2 grams topically to the affected area  three times daily as needed for pain. 12/19/22   Cleotilde Garnette HERO, MD  folic acid  (FOLVITE ) 1 MG tablet Take 1 mg by mouth daily.    [provider]  HUMALOG  KWIKPEN 100 UNIT/ML KwikPen Inject 15 Units into the skin 3 (three) times daily. 01/02/24   Sherlynn Madden, MD  insulin  degludec (TRESIBA ) 100 UNIT/ML FlexTouch Pen Inject 15 Units into the skin daily. 3 01/02/24   Veludandi, Prashanthi, MD  isosorbide  mononitrate (IMDUR ) 30 MG 24 hr tablet Take 1 tablet (30 mg total) by mouth daily. 04/12/24   de [place], Raymond J, MD  latanoprost  (XALATAN ) 0.005 % ophthalmic solution Place 1 drop into both eyes at bedtime.    [provider]  leptospermum manuka honey (MEDIHONEY) PSTE paste Apply 1 Application topically daily. Cleanse R lower leg wound with Vashe wound cleanser Soila [phone]), do not rinse and allow to air dry. Apply Medihoney to wound bed daily, cover with dry gauze and cover with silicone foam or ABD pad and Kerlix roll gauze to secure.Interchangeable with TheraHoney Apply thin layer (3 mm) to wound. 01/25/24   Hongalgi, Anand D, MD  lidocaine -prilocaine  (EMLA ) cream Apply topically as directed. At dialysis 10/30/23   [provider]  Menthol -Camphor (TIGER BALM ARTHRITIS RUB) 11-11 % CREA Apply 1 Application topically as needed (pain).    [provider]   midodrine  (PROAMATINE ) 10 MG tablet Take 10 mg by mouth 3 (three) times a week. 03/21/23   [provider]  multivitamin (RENA-VIT) TABS tablet Take 1 tablet by mouth daily.    [provider]  neomycin-bacitracin-polymyxin [phone] OINT Apply 1 Application topically in the morning and at bedtime. 12/26/23   Sherlynn Madden, MD  nitroGLYCERIN  (NITROSTAT ) 0.3 MG SL tablet Place 1 tablet (0.3 mg total) under the tongue every 5 (five) minutes as needed for chest pain. 02/27/24   Nishan, Peter C, MD  oxyCODONE -acetaminophen  (PERCOCET) 5-325 MG tablet Take 1 tablet by mouth every 8 (eight) hours as needed for severe pain (pain score 7-10). 04/12/24   Urbano Albright, MD  polyethylene glycol (MIRALAX  / GLYCOLAX ) 17 g packet Take 17 g by mouth daily as needed for moderate constipation.    [provider]  PREVIDENT 5000 PLUS 1.1 % CREA dental cream Place 1 Application onto teeth at bedtime. 05/25/23   [provider]  simethicone  (MYLICON) 125 MG chewable tablet Chew 125 mg by mouth as needed for flatulence.    [provider]  timolol  (TIMOPTIC ) 0.5 % ophthalmic solution Place 1 drop into both eyes in the morning. 04/04/19   [provider]  umeclidinium-vilanterol (ANORO ELLIPTA ) 62.5-25 MCG/ACT AEPB Inhale 1 puff into the lungs daily. 06/07/23   Cleotilde Garnette HERO, MD    Current Facility-Administered Medications  Medication Dose Route Frequency Provider Last Rate Last Admin   acetaminophen  (TYLENOL ) tablet 650 mg  650 mg Oral Q6H PRN Gretta Lonni PARAS, MD   650 mg at 04/21/24 1943   Or   acetaminophen  (TYLENOL ) suppository 650 mg  650 mg Rectal Q6H PRN Gretta Lonni PARAS, MD       allopurinol  (ZYLOPRIM ) tablet 100 mg  100 mg Oral Daily Gretta Lonni PARAS, MD   100 mg at 04/23/24 0813   aspirin  EC tablet 81 mg  81 mg Oral Daily Gretta Lonni PARAS, MD   81 mg at 04/23/24 0813   atorvastatin  (LIPITOR ) tablet 40 mg  40 mg Oral Daily Remi Pippin, NP   40 mg at 04/23/24 [phone]  bictegravir-emtricitabine -tenofovir  AF (BIKTARVY ) 50-200-25 MG per tablet 1 tablet  1 tablet Oral Daily Gretta Lonni PARAS, MD   1 tablet at 04/23/24 0813   Chlorhexidine  Gluconate Cloth 2 % PADS 6 each  6 each Topical Q0600 Gretta Lonni PARAS, MD   6 each at 04/24/24 0654   Chlorhexidine  Gluconate Cloth 2 % PADS 6 each  6 each Topical Q0600 Geralynn Charleston, MD   6 each at 04/24/24 0654   feeding supplement (ENSURE PLUS HIGH PROTEIN) liquid 237 mL  237 mL Oral BID BM Danford, Lonni SQUIBB, MD   237 mL at 04/23/24 1730   haloperidol (HALDOL) tablet 2 mg  2 mg Oral Q6H PRN Jonel Lonni SQUIBB, MD   2 mg at 04/24/24 1053   heparin  injection 5,000 Units  5,000 Units Subcutaneous Q8H Gretta Lonni PARAS, MD   5,000 Units at 04/24/24 0540   insulin  aspart (novoLOG ) injection 0-5 Units  0-5 Units Subcutaneous QHS Gretta Lonni PARAS, MD   2 Units at 04/23/24 2148   insulin  aspart (novoLOG ) injection 0-9 Units  0-9 Units Subcutaneous TID WC Gretta Lonni PARAS, MD   1 Units at 04/24/24 0700   labetalol  (NORMODYNE ) injection 10 mg  10 mg Intravenous Q10 min PRN Gretta Lonni PARAS, MD       multivitamin (RENA-VIT) tablet 1 tablet  1 tablet Oral QHS Jonel Lonni SQUIBB, MD   1 tablet at 04/23/24 2148   nutrition supplement (JUVEN) (JUVEN) powder packet 1 packet  1 packet Oral BID BM Danford, Lonni SQUIBB, MD   1 packet at 04/23/24 0813   ondansetron  (ZOFRAN ) tablet 4 mg  4 mg Oral Q6H PRN Gretta Lonni PARAS, MD       Or   ondansetron  (ZOFRAN ) injection 4 mg  4 mg Intravenous Q6H PRN Gretta Lonni PARAS, MD       Oral care mouth rinse  15 mL Mouth Rinse PRN Danford, Lonni SQUIBB, MD       oxyCODONE  (Oxy IR/ROXICODONE ) immediate release tablet 5 mg  5 mg Oral Q6H PRN Jonel Lonni SQUIBB, MD   5 mg at 04/24/24 0659   QUEtiapine (SEROQUEL) tablet 25 mg  25 mg Oral QHS Danford, Christopher P, MD   25 mg at 04/23/24 2148   sodium chloride  flush (NS) 0.9 %  injection 3 mL  3 mL Intravenous Q12H Gretta Lonni PARAS, MD   3 mL at 04/24/24 1502   sodium chloride  flush (NS) 0.9 % injection 3 mL  3 mL Intravenous PRN Gretta Lonni PARAS, MD       sodium chloride  flush (NS) 0.9 % injection 3 mL  3 mL Intravenous Q12H Gretta Lonni PARAS, MD   3 mL at 04/23/24 2150   sodium chloride  flush (NS) 0.9 % injection 3 mL  3 mL Intravenous PRN Gretta Lonni PARAS, MD       sodium zirconium cyclosilicate  (LOKELMA ) packet 5 g  5 g Oral Daily Macel Jayson PARAS, MD       umeclidinium-vilanterol (ANORO ELLIPTA ) 62.5-25 MCG/ACT 1 puff  1 puff Inhalation Daily Gretta Lonni PARAS, MD   1 puff at 04/23/24 9182    Allergies as of 04/17/2024 - Review Complete 04/17/2024  Allergen Reaction Noted   Augmentin  [amoxicillin -pot clavulanate] Diarrhea and Other (See Comments) 12/05/2018   Morphine  sulfate Anaphylaxis 04/06/2022   Mucinex  fast-max Other (See Comments) 11/30/2020   Amphetamines Other (See Comments) 01/15/2012    Family History  Problem Relation Age of Onset   Heart failure Father  Hypertension Father    Diabetes Brother    Heart attack Brother    Alzheimer's disease Mother    Stroke Sister    Diabetes Sister    Alzheimer's disease Sister    Hypertension Brother    Diabetes Brother    Drug abuse Brother    Colon cancer Neg Hx     Social History   Socioeconomic History   Marital status: Single    Spouse name: Not on file   Number of children: 1   Years of education: Not on file   Highest education level: Not on file  Occupational History   Occupation: retired  Tobacco Use   Smoking status: Every Day    Current packs/day: 0.50    Average packs/day: 0.5 packs/day for 43.0 years (21.5 ttl pk-yrs)    Types: Cigarettes    Passive exposure: Current   Smokeless tobacco: Never  Vaping Use   Vaping status: Never Used  Substance and Sexual Activity   Alcohol use: Not Currently    Alcohol/week: 12.0 standard drinks of alcohol    Types: 12  Standard drinks or equivalent per week    Comment: occassional   Drug use: Not Currently    Types: Cocaine    Comment: hx of crack/cocaine 38yrs ago 10/01/2019- none   Sexual activity: Yes    Partners: Male    Comment: Declined condoms  Other Topics Concern   Not on file  Social History Narrative   Retired.       As of 05/2015:   Diet: No salt    Caffeine    Married: Single    House: Condo, 2 stories, 1 person (self)    Pets: No    Current/Past profession: N/A   Exercise: walks daily    Living Will: Yes    DNR   POA/HPOA: No       Social Drivers of Corporate investment banker Strain: Low Risk  (10/26/2020)   Overall Financial Resource Strain (CARDIA)    Difficulty of Paying Living Expenses: Not hard at all  Food Insecurity: Patient Declined (04/18/2024)   Hunger Vital Sign    Worried About Running Out of Food in the Last Year: Patient declined    Ran Out of Food in the Last Year: Patient declined  Transportation Needs: No Transportation Needs (02/27/2024)   PRAPARE - Administrator, Civil Service (Medical): No    Lack of Transportation (Non-Medical): No  Physical Activity: Insufficiently Active (06/26/2018)   Exercise Vital Sign    Days of Exercise per Week: 7 days    Minutes of Exercise per Session: 20 min  Stress: No Stress Concern Present (02/27/2024)   [employer] of Occupational Health - Occupational Stress Questionnaire    Feeling of Stress : Not at all  Social Connections: Somewhat Isolated (06/26/2018)   Social Connection and Isolation Panel    Frequency of Communication with Friends and Family: Three times a week    Frequency of Social Gatherings with Friends and Family: Three times a week    Attends Religious Services: More than 4 times per year    Active Member of Clubs or Organizations: No    Attends Banker Meetings: Never    Marital Status: Never married  Intimate Partner Violence: Not At Risk (02/27/2024)   Humiliation, Afraid, Rape,  and Kick questionnaire    Fear of Current or Ex-Partner: No    Emotionally Abused: No    Physically Abused: No  Sexually Abused: No    Review of Systems: Pertinent positive and negative review of systems were noted in the above HPI section.  All other review of systems was otherwise negative.   Physical Exam: Vital signs in last 24 hours: Temp:  [97.5 F (36.4 C)-98.4 F (36.9 C)] 97.7 F (36.5 C) (07/02 1221) Pulse Rate:  [12-102] 99 (07/02 1221) Resp:  [12-25] 17 (07/02 1221) BP: (76-124)/(46-82) 101/66 (07/02 1221) SpO2:  [87 %-100 %] 100 % (07/02 1200) Weight:  [82.1 kg] 82.1 kg (07/02 0750) Last BM Date : 04/22/24 General:   Alert,  Well-developed, acute and chronically ill-appearing elderly African-American male , irritable, uncooperative and pushing hand away to exam Head:  Normocephalic and atraumatic. Eyes:  Sclera clear, no icterus.   Conjunctiva pink. Ears:  Normal auditory acuity. Nose:  No deformity, discharge,  or lesions. Mouth:  No deformity or lesions.   Neck:  Supple; no masses or thyromegaly. Lungs:  Clear throughout to auscultation.   No wheezes, crackles, or rhonchi.  Heart:  Regular rate and rhythm; no murmurs, clicks, rubs,  or gallops. Abdomen:  Soft,nontender, BS active,nonpalp mass or hsm.   Rectal: Not done, there is loose stool on the pad which appears greenish-brown not grossly bloody but I am not fully able to visualize Msk:  Symmetrical without gross deformities. . Pulses:  Normal pulses noted. Extremities:  Without clubbing or edema. Neurologic:  Alert and  oriented x1 confused, irritable Skin:  Intact without significant lesions or rashes..   Intake/Output from previous day: 07/01 0701 - 07/02 0700 In: 240 [P.O.:240] Out: -  Intake/Output this shift: Total I/O In: -  Out: -0.3   Lab Results: Recent Labs    04/22/24 0448 04/23/24 1237 04/24/24 0502  WBC 9.2 9.8 9.0  HGB 12.0* 10.8* 11.3*  HCT 37.1* 33.0* 34.9*  PLT 148* 189  189   BMET Recent Labs    04/22/24 0805 04/23/24 1237 04/24/24 0502  NA 136 130* 133*  K 5.5* 5.6* 6.2*  CL 92* 89* 90*  CO2 27 25 25   GLUCOSE 155* 166* 116*  BUN 117* 80* 98*  CREATININE 12.10* 8.40* 9.59*  CALCIUM  9.1 9.3 9.6   LFT Recent Labs    04/22/24 0805  ALBUMIN  2.2*   PT/INR No results for input(s): LABPROT, INR in the last 72 hours. Hepatitis Panel No results for input(s): HEPBSAG, HCVAB, HEPAIGM, HEPBIGM in the last 72 hours.    IMPRESSION:  #71 73 year old African-American male with multiple serious medical problems who we are asked to see after a bloody bowel movement at the end of dialysis today. Hemoglobin stable at 11.3 this morning  He had 1 other bowel movement after return from dialysis that appeared dark brown and not grossly bloody per nurse  Patient is currently on Plavix  and aspirin   Etiology of the bleeding is not entirely clear, hopefully this will not manifest into any significant active bleeding. He has previously documented diverticulosis, and also has had numerous procedures over the years for gastric and duodenal AVMs which have required treatment with APC.  (On chronic octreotide )  #2 status post angiogram, angioplasty and lithotripsy right lower extremity 04/18/2024 for ischemia-on aspirin  and Plavix   #3 multiple acute/early subacute infarcts left midbrain and right frontal parietal white matter found after admission #4  apical aneurysm #5.  Diabetes mellitus #6.  End-stage renal disease on dialysis #7.  HIV on Biktarvy  #8.  Cardiomyopathy with EF 40 to 45% #9.  Coronary artery disease status post  CABG 2004 PCI 2021  PLAN: Full liquid diet Manage conservatively for now, monitor for evidence of further active bleeding. Serial hemoglobins every 8 hours and transfuse as indicated Will hold Plavix  and leave on aspirin  for now as just had angioplasty last week right lower extremity-if he does not have drop in hemoglobin or  any further bleeding can resume Plavix  in the next day or 2 He would not be able to cooperate for colonoscopy at this point. GI will follow with you  Sunaina Ferrando EsterwoodPA-C  04/24/2024, 3:07 PM

## 2024-04-24 NOTE — Procedures (Signed)
 Received patient in bed to unit.  Alert and oriented.  Informed consent signed and in chart.   TX duration:  Patient did not complete full treatment, see notes. Transported back to the room  Alert, without acute distress.  Hand-off given to patient's nurse.   Access used: right graft Access issues: no venous pressure, infiltration  Total UF removed: -0.3 Medication(s) given: haldol   Greig Silvan, RN [facility]

## 2024-04-24 NOTE — Procedures (Signed)
 Patient had large BM with blood noted in stool. Nephrologist and floor nurse notified.

## 2024-04-24 NOTE — Progress Notes (Signed)
 Pt agitated and noncompliant since returning from HD. Refusing all meds, vitals and treatments at this time.

## 2024-04-24 NOTE — Progress Notes (Signed)
 Patient refused vitals.

## 2024-04-24 NOTE — Progress Notes (Signed)
 [facility] Progress Note  Subjective:  Patient tired today with no verbal complaints.  Talked with sister yesterday about overall concerns regarding his mental status and unable to get dialysis in the outpatient setting right now without a sitter. Dialysis this morning, located by low venous pressures.  Issues having to switch cartridge twice and restart the patient without significant improvement despite easily pulling blood.  Plan was to restart him to finish his 1 hour left but patient moved and infiltrated during the change so decided to stop dialysis for today  Vitals:   04/24/24 1130 04/24/24 1200 04/24/24 1215 04/24/24 1221  BP: (!) 96/54 (!) 89/46 (!) 92/52 101/66  Pulse: 69 86 93 99  Resp: 15 (!) 25 15 17   Temp:    97.7 F (36.5 C)  TempSrc:      SpO2: (!) 87% 100%    Weight:      Height:        Exam: Gen tired but awake, no distress No jvd or bruits Bilateral chest rise with no increased work of breathing Normal rate, no rub Abd soft ntnd no mass or ascites +bs MS bilat feet are wrapped Minimal edema in the lower extremities at the midshin Neuro lethargic but nonfocal    RFA AVG+bruit         OP HD: [facility] MWF 3h  B400   86kg  2K  R AVG  HEparin  none Last OP HD 6/25, post wt 86.6kg Gets to dry wt, good compliance Low BP's in the 70-90s on HD       Assessment/ Plan: Ischemic wound right foot: per pmd AMS: neurology consulting, 3 separate lacunar strokes acute/ subacute, suspected embolic. However not improving significantly ESRD: on HD MWF. Wants to change units to [facility]. Waiting for AMS to improve. Ongoing GOC conversations. BUN high and K high due to high catabolic state but unclear cause for this. High protein consumption could be a factor. Given treatment cut short today we plan to do HD again tomorrow BP: not on BP lowering meds, BP's stable low-normal.  Hyperkalemia: Control with dialysis and start lokelma  5g daily. 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  CM EF 30-35% by echo here GOC: Have been discussed with Sister Aldona previously.  Continue conversations.  Does not have any options of his mental status does not improve  Recent Labs  Lab 04/21/24 1718 04/22/24 0448 04/22/24 0805 04/23/24 1237 04/24/24 0502  HGB  --    < >  --  10.8* 11.3*  ALBUMIN  2.4*  --  2.2*  --   --   CALCIUM  9.5  --  9.1 9.3 9.6  PHOS 6.8*  --  7.2*  --   --   CREATININE 10.97*  --  12.10* 8.40* 9.59*  K 5.5*  --  5.5* 5.6* 6.2*   < > = values in this interval not displayed.   No results for input(s): IRON , TIBC, FERRITIN in the last 168 hours. Inpatient medications:  allopurinol   100 mg Oral Daily   aspirin  EC  81 mg Oral Daily   atorvastatin   40 mg Oral Daily   bictegravir-emtricitabine -tenofovir  AF  1 tablet Oral Daily   Chlorhexidine  Gluconate Cloth  6 each Topical Q0600   Chlorhexidine  Gluconate Cloth  6 each Topical Q0600   clopidogrel   75 mg Oral Q breakfast   feeding supplement  237 mL Oral BID BM   heparin   5,000 Units Subcutaneous  Q8H   insulin  aspart  0-5 Units Subcutaneous QHS   insulin  aspart  0-9 Units Subcutaneous TID WC   multivitamin  1 tablet Oral QHS   nutrition supplement (JUVEN)  1 packet Oral BID BM   QUEtiapine  25 mg Oral QHS   sodium chloride  flush  3 mL Intravenous Q12H   sodium chloride  flush  3 mL Intravenous Q12H   sodium zirconium cyclosilicate   5 g Oral Daily   umeclidinium-vilanterol  1 puff Inhalation Daily      acetaminophen  **OR** acetaminophen , haloperidol, labetalol , ondansetron  **OR** ondansetron  (ZOFRAN ) IV, mouth rinse, oxyCODONE , sodium chloride  flush, sodium chloride  flush

## 2024-04-24 NOTE — Progress Notes (Signed)
 PROGRESS NOTE  Jacob [name] FMW:[id number] DOB: 1951/07/09 DOA: 04/17/2024 PCP: de [place], Raymond J, MD   LOS: 7 days   Brief Narrative / Interim history: 73 y.o. M with ESRD on HD MWF, HIV, HTN, sCHF EF 40-45%, hx apical aneurysm, DM, CAD s/p CABG 2004 last PCI 2021, hep C, hx GIB due to AVMs, still smoking, PVD of RLE, chronic RLE wound who presented with nonhealing right medial calf wound.  Hospitalization complicated by delirium.  Subjective / 24h Interval events: Seen in dialysis, appears sleepy, wakes up easily and he has no complaints.  Assesement and Plan: Principal Problem:   Ischemic leg wound Active Problems:   ESRD (end stage renal disease) (HCC)   Stroke (HCC)   Human immunodeficiency virus (HIV) disease (HCC)   Essential hypertension   Coronary artery disease with angina   DM (diabetes mellitus) (HCC)   Chronic systolic CHF (congestive heart failure) (HCC)   Anemia of chronic disease   Chronic obstructive pulmonary disease (HCC)   Acute metabolic encephalopathy   History of hepatitis C   History of GI bleeding due to AVMs   Malnutrition of moderate degree   Principal problem Ischemic leg wound, Peripheral vascular disease - This was the initial presenting complaint.  Actually he was told to come to the ER by [facility] due to missed appointments with Cardiology. He had normal ABI in Feb, then developed a wound in March.  This progressed so he re-established with the [facility] in April.  They treated it for a month, repeated ABI that now showed high grade stenosis in the RLE.   - Cardiology and vascular surgery consulted on admission, underwent angiography and angioplasty with shockwave lithotripsy on 6/26 with Dr. Gretta -Now optimized, continue aspirin  and Lipitor    Active problems  Acute metabolic encephalopathy, Uremia - Encephalopathy has become the main issue.At baseline, patient with possible dementia, but no diagnosed memory loss and living  independently, managing own medicines and dialysis. Here, he was very disoriented on admission, and had waxing and waning confusion/somnolence since.   TSH, ammonia, B12, LFTs, RPR all normal.  CK normal.  No prior COPD.  Obtained MRI that showed strokes, and suspect at this time that his delirium is from stroke superimposed on undiagnosed mild dementia. - Standard delirium precautions: blinds open and lights on during day, TV off, minimize interruptions at night, glasses/hearing aids, PT/OT, avoiding Beers list medications     Stroke [facility]) - MRI brain showed scattered left MCA punctate infarcts. MRA showed no high grade intracranial stenoses. Carotid US  showed no stenoses. Echocardiogram showed reduced EF but no cardiogenic source of embolism. Continue DAPT. Continue Lipitor . Neurology f/u after discharge -Needs SNF, 30-day event monitor at discharge   ESRD - Consult nephrology for HD   Human immunodeficiency virus (HIV) disease (HCC) - HIV quant last Jan undetectable. Continue Biktarvy    History of GI bleeding due to AVMs - On octreotide  monthly as an outpatient, hemoglobin appears stable, no reported bleeding    History of hepatitis C - LFTs normal   Chronic obstructive pulmonary disease (HCC) - No evidence of flare, no wheezing. Continue Anoro   Anemia of chronic disease - Hgb stable, no bleeding observed   Chronic systolic CHF (congestive heart failure) (HCC) LV aneurysm resolved. - EF down from 40% to 30-35% here. Hold carvedilol  given soft BP. Volume status per HD   DM (diabetes mellitus) (HCC) - Glucose controlled.  Continue sliding scale   Coronary artery  disease with angina Hypertension - Continue Lipitor  (had been stopped outpatient due to muscle aches) - Hold carvedilol  and Imdur  due to soft BP    Scheduled Meds:  allopurinol   100 mg Oral Daily   aspirin  EC  81 mg Oral Daily   atorvastatin   40 mg Oral Daily   bictegravir-emtricitabine -tenofovir  AF  1 tablet Oral Daily    Chlorhexidine  Gluconate Cloth  6 each Topical Q0600   Chlorhexidine  Gluconate Cloth  6 each Topical Q0600   clopidogrel   75 mg Oral Q breakfast   feeding supplement  237 mL Oral BID BM   heparin   5,000 Units Subcutaneous Q8H   insulin  aspart  0-5 Units Subcutaneous QHS   insulin  aspart  0-9 Units Subcutaneous TID WC   multivitamin  1 tablet Oral QHS   nutrition supplement (JUVEN)  1 packet Oral BID BM   QUEtiapine  25 mg Oral QHS   sodium chloride  flush  3 mL Intravenous Q12H   sodium chloride  flush  3 mL Intravenous Q12H   sodium zirconium cyclosilicate   5 g Oral Daily   umeclidinium-vilanterol  1 puff Inhalation Daily   Continuous Infusions: PRN Meds:.acetaminophen  **OR** acetaminophen , haloperidol, labetalol , ondansetron  **OR** ondansetron  (ZOFRAN ) IV, mouth rinse, oxyCODONE , sodium chloride  flush, sodium chloride  flush  Current Outpatient Medications  Medication Instructions   acetaminophen  (TYLENOL ) 1,300 mg, 3 times daily   albuterol  (PROVENTIL ) 2.5 mg, Nebulization, Every 6 hours PRN   allopurinol  (ZYLOPRIM ) 100 mg, Oral, Daily   atorvastatin  (LIPITOR ) 10 MG tablet TAKE 1 TABLET(10 MG) BY MOUTH DAILY.   B-D UF III MINI PEN NEEDLES 31G X 5 MM MISC 1 Box, Subcutaneous, 4 times daily   BIKTARVY  50-200-25 MG TABS tablet TAKE 1 TABLET BY MOUTH 1 TIME A DAY   Calcium  Acetate 667-2,001 mg, See admin instructions   carvedilol  (COREG ) 3.125 MG tablet TAKE 1 TABLET(3.125 MG) BY MOUTH TWICE DAILY WITH A MEAL   diclofenac  Sodium (VOLTAREN ) 1 % GEL Apply 2 grams topically to the affected area three times daily as needed for pain.   folic acid  (FOLVITE ) 1 mg, Oral, Daily   gabapentin  (NEURONTIN ) 300 mg, Oral, 2 times daily, Patient is taking 1 tablet in the early evening after dialysis and 1 tablet at bedtime   HumaLOG  KwikPen 15 Units, Subcutaneous, 3 times daily   insulin  degludec (TRESIBA ) 15 Units, Subcutaneous, Daily, 3   isosorbide  mononitrate (IMDUR ) 30 mg, Oral, Daily   latanoprost   (XALATAN ) 0.005 % ophthalmic solution 1 drop, Daily at bedtime   leptospermum manuka honey (MEDIHONEY) PSTE paste 1 Application, Topical, Daily, Cleanse R lower leg wound with Vashe wound cleanser Soila [phone]), do not rinse and allow to air dry. Apply Medihoney to wound bed daily, cover with dry gauze and cover with silicone foam or ABD pad and Kerlix roll gauze to secure.Interchangeable with TheraHoney Apply thin layer (3 mm) to wound.   lidocaine -prilocaine  (EMLA ) cream As directed   Menthol -Camphor (TIGER BALM ARTHRITIS RUB) 11-11 % CREA 1 Application, As needed   midodrine  (PROAMATINE ) 10 mg, 3 times weekly   multivitamin (RENA-VIT) TABS tablet 1 tablet, Daily   neomycin-bacitracin-polymyxin [phone] OINT 1 Application, Apply externally, 2 times daily   nitroGLYCERIN  (NITROSTAT ) 0.3 mg, Sublingual, Every 5 min PRN   octreotide  (SANDOSTATIN  LAR DEPOT) 10 MG injection Inject 10 mg into the muscle every 28 days.   oxyCODONE -acetaminophen  (PERCOCET) 5-325 MG tablet 1 tablet, Oral, Every 8 hours PRN   polyethylene glycol (MIRALAX  / GLYCOLAX ) 17 g, Daily PRN  PREVIDENT 5000 PLUS 1.1 % CREA dental cream 1 Application, Daily at bedtime   simethicone  (MYLICON) 125 mg, As needed   timolol  (TIMOPTIC ) 0.5 % ophthalmic solution 1 drop, Every morning   umeclidinium-vilanterol (ANORO ELLIPTA ) 62.5-25 MCG/ACT AEPB 1 puff, Inhalation, Daily    Diet Orders (From admission, onward)     Start     Ordered   04/18/24 1905  Diet regular Room service appropriate? Yes; Fluid consistency: Thin  Diet effective now       Question Answer Comment  Room service appropriate? Yes   Fluid consistency: Thin      04/18/24 1904            DVT prophylaxis: heparin  injection 5,000 Units Start: 04/18/24 0600   Lab Results  Component Value Date   PLT 189 04/24/2024      Code Status: Full Code  Family Communication: no family at bedside   Status is: Inpatient Remains inpatient appropriate because:  severity of illness  Level of care: Telemetry Medical  Consultants:  Nephrology Vascular Cards  Objective: Vitals:   04/24/24 0930 04/24/24 1000 04/24/24 1011 04/24/24 1030  BP: 97/66 (S) (!) 76/46 103/77 92/72  Pulse: (!) 12  99 (!) 102  Resp: 14 16 (!) 22 15  Temp:      TempSrc:      SpO2:      Weight:      Height:        Intake/Output Summary (Last 24 hours) at 04/24/2024 1034 Last data filed at 04/23/2024 1434 Gross per 24 hour  Intake 120 ml  Output --  Net 120 ml   Wt Readings from Last 3 Encounters:  04/24/24 82.1 kg  03/05/24 89.6 kg  02/22/24 89.3 kg    Examination:  Constitutional: NAD Eyes: no scleral icterus ENMT: Mucous membranes are moist.  Neck: normal, supple Respiratory: clear to auscultation bilaterally, no wheezing, no crackles.  Cardiovascular: Regular rate and rhythm, no murmurs / rubs / gallops.  Abdomen: non distended, no tenderness. Bowel sounds positive.  Musculoskeletal: no clubbing / cyanosis.   Data Reviewed: I have independently reviewed following labs and imaging studies   CBC Recent Labs  Lab 04/17/24 1615 04/18/24 0544 04/19/24 0917 04/22/24 0448 04/23/24 1237 04/24/24 0502  WBC 8.3 7.3 8.2 9.2 9.8 9.0  HGB 12.9* 11.6* 12.6* 12.0* 10.8* 11.3*  HCT 41.7 36.3* 39.4 37.1* 33.0* 34.9*  PLT 121* 141* 145* 148* 189 189  MCV 105.3* 103.1* 103.4* 101.9* 99.7 98.9  MCH 32.6 33.0 33.1 33.0 32.6 32.0  MCHC 30.9 32.0 32.0 32.3 32.7 32.4  RDW 15.3 15.1 15.1 14.9 14.9 14.8  LYMPHSABS 2.1  --   --   --   --   --   MONOABS 1.0  --   --   --   --   --   EOSABS 0.1  --   --   --   --   --   BASOSABS 0.1  --   --   --   --   --     Recent Labs  Lab 04/18/24 0544 04/18/24 1134 04/19/24 1209 04/19/24 2229 04/20/24 0600 04/20/24 1435 04/21/24 0737 04/21/24 1718 04/22/24 0805 04/23/24 1237 04/24/24 0502  NA 136  --    < >  --   --    < > 133* 136 136 130* 133*  K 4.4  --    < >  --   --    < > 6.1* 5.5* 5.5*  5.6* 6.2*  CL 94*   --    < >  --   --    < > 94* 92* 92* 89* 90*  CO2 25  --    < >  --   --    < > 21* 26 27 25 25   GLUCOSE 132*  --    < >  --   --    < > 180* 188* 155* 166* 116*  BUN 25*  --    < >  --   --    < > 79* 90* 117* 80* 98*  CREATININE 7.61*  --    < >  --   --    < > 10.43* 10.97* 12.10* 8.40* 9.59*  CALCIUM  8.6*  --    < >  --   --    < > 8.9 9.5 9.1 9.3 9.6  AST 23  --   --   --   --   --   --   --   --   --   --   ALT 13  --   --   --   --   --   --   --   --   --   --   ALKPHOS 65  --   --   --   --   --   --   --   --   --   --   BILITOT 0.7  --   --   --   --   --   --   --   --   --   --   ALBUMIN  2.4*  --   --   --   --   --   --  2.4* 2.2*  --   --   LATICACIDVEN  --  1.4  --   --   --   --   --   --   --   --  1.5  TSH 0.466  --   --  0.656  --   --   --   --   --   --   --   HGBA1C  --   --   --   --  6.9*  --   --   --   --   --   --   AMMONIA 27  --   --  25  --   --   --   --   --   --   --    < > = values in this interval not displayed.    ------------------------------------------------------------------------------------------------------------------ No results for input(s): CHOL, HDL, LDLCALC, TRIG, CHOLHDL, LDLDIRECT in the last 72 hours.  Lab Results  Component Value Date   HGBA1C 6.9 (H) 04/20/2024   ------------------------------------------------------------------------------------------------------------------ No results for input(s): TSH, T4TOTAL, T3FREE, THYROIDAB in the last 72 hours.  Invalid input(s): FREET3  Cardiac Enzymes No results for input(s): CKMB, TROPONINI, MYOGLOBIN in the last 168 hours.  Invalid input(s): CK ------------------------------------------------------------------------------------------------------------------    Component Value Date/Time   BNP 2,378.1 (H) 01/23/2024 1931    CBG: Recent Labs  Lab 04/23/24 0613 04/23/24 1232 04/23/24 1634 04/23/24 2123 04/24/24 0631  GLUCAP 179* 140* 148*  221* 124*    Recent Results (from the past 240 hours)  Culture, blood (Routine X 2) w Reflex to ID Panel     Status: None   Collection Time: 04/19/24 10:28 PM   Specimen: BLOOD  LEFT HAND  Result Value Ref Range Status   Specimen Description BLOOD LEFT HAND  Final   Special Requests   Final    BOTTLES DRAWN AEROBIC AND ANAEROBIC Blood Culture results may not be optimal due to an inadequate volume of blood received in culture bottles   Culture   Final    NO GROWTH 5 DAYS Performed at [facility], [address], [place], [name] [address]    Report Status 04/24/2024 FINAL  Final  Culture, blood (Routine X 2) w Reflex to ID Panel     Status: None   Collection Time: 04/19/24 10:28 PM   Specimen: BLOOD LEFT HAND  Result Value Ref Range Status   Specimen Description BLOOD LEFT HAND  Final   Special Requests   Final    BOTTLES DRAWN AEROBIC AND ANAEROBIC Blood Culture results may not be optimal due to an inadequate volume of blood received in culture bottles   Culture   Final    NO GROWTH 5 DAYS Performed at [facility], [address], [place], [name] [address]    Report Status 04/24/2024 FINAL  Final     Radiology Studies: VAS US  CAROTID Result Date: 04/23/2024 Carotid Arterial Duplex Study Patient Name:  Jacob [name]  Date of Exam:   04/23/2024 Medical Rec #: [id number]        Accession #:    [id number] Date of Birth: [birth year]         Patient Gender: M Patient Age:   67 years Exam Location:  [facility] Procedure:      VAS US  CAROTID Referring Phys: DEVON SHAFER --------------------------------------------------------------------------------  Indications:       CVA. Risk Factors:      Hypertension, hyperlipidemia, Diabetes. Limitations        Today's exam was limited due to the patient's inability or                    unwillingness to cooperate, the patient's respiratory                    variation and patient constant movement. Comparison Study:  No prior studies.  Performing Technologist: Cordella Collet RVT  Examination Guidelines: A complete evaluation includes B-mode imaging, spectral Doppler, color Doppler, and power Doppler as needed of all accessible portions of each vessel. Bilateral testing is considered an integral part of a complete examination. Limited examinations for reoccurring indications may be performed as noted.  Right Carotid Findings: +----------+--------+--------+--------+-----------------------+--------+           PSV cm/sEDV cm/sStenosisPlaque Description     Comments +----------+--------+--------+--------+-----------------------+--------+ CCA Prox  77      11              smooth and heterogenous         +----------+--------+--------+--------+-----------------------+--------+ CCA Distal106     11              calcific                        +----------+--------+--------+--------+-----------------------+--------+ ICA Prox  90      13              calcific                        +----------+--------+--------+--------+-----------------------+--------+ ICA Mid   68      15                                              +----------+--------+--------+--------+-----------------------+--------+  ICA Distal47      8                                      tortuous +----------+--------+--------+--------+-----------------------+--------+ ECA       140     9                                               +----------+--------+--------+--------+-----------------------+--------+ +----------+--------+-------+--------+-------------------+           PSV cm/sEDV cmsDescribeArm Pressure (mmHG) +----------+--------+-------+--------+-------------------+ [id number]                                        +----------+--------+-------+--------+-------------------+ +---------+--------+--+--------+-+---------+ VertebralPSV cm/s24EDV cm/s5Antegrade +---------+--------+--+--------+-+---------+  Left Carotid Findings:  +----------+--------+--------+--------+-----------------------+--------+           PSV cm/sEDV cm/sStenosisPlaque Description     Comments +----------+--------+--------+--------+-----------------------+--------+ CCA Prox  89      11              heterogenous and smoothtortuous +----------+--------+--------+--------+-----------------------+--------+ CCA Distal140     16              calcific                        +----------+--------+--------+--------+-----------------------+--------+ ICA Prox  101     16              calcific                        +----------+--------+--------+--------+-----------------------+--------+ ICA Mid   70      10                                     tortuous +----------+--------+--------+--------+-----------------------+--------+ ICA Distal59      11                                     tortuous +----------+--------+--------+--------+-----------------------+--------+ ECA       181     8                                               +----------+--------+--------+--------+-----------------------+--------+ +----------+--------+--------+--------+-------------------+           PSV cm/sEDV cm/sDescribeArm Pressure (mmHG) +----------+--------+--------+--------+-------------------+ [id number]                                         +----------+--------+--------+--------+-------------------+ +---------+--------+--+--------+--+---------+ VertebralPSV cm/s41EDV cm/s10Antegrade +---------+--------+--+--------+--+---------+   Summary: Right Carotid: Velocities in the right ICA are consistent with a 1-39% stenosis. Left Carotid: Velocities in the left ICA are consistent with a 1-39% stenosis. Vertebrals: Bilateral vertebral arteries demonstrate antegrade flow. *See table(s) above for measurements and observations.     Preliminary      Nilda Fendt, MD, PhD [facility]  Between 7 am - 7 pm  I am available, please  contact me via Amion (for emergencies) or Securechat (non urgent messages)  Between 7 pm - 7 am I am not available, please contact night coverage MD/APP via Amion

## 2024-04-25 ENCOUNTER — Encounter: Attending: Physical Medicine & Rehabilitation | Admitting: Physical Medicine & Rehabilitation

## 2024-04-25 ENCOUNTER — Other Ambulatory Visit: Payer: Self-pay

## 2024-04-25 DIAGNOSIS — G894 Chronic pain syndrome: Secondary | ICD-10-CM | POA: Insufficient documentation

## 2024-04-25 DIAGNOSIS — S81801A Unspecified open wound, right lower leg, initial encounter: Secondary | ICD-10-CM | POA: Diagnosis not present

## 2024-04-25 DIAGNOSIS — G8929 Other chronic pain: Secondary | ICD-10-CM | POA: Insufficient documentation

## 2024-04-25 DIAGNOSIS — M25562 Pain in left knee: Secondary | ICD-10-CM | POA: Insufficient documentation

## 2024-04-25 DIAGNOSIS — M25561 Pain in right knee: Secondary | ICD-10-CM | POA: Insufficient documentation

## 2024-04-25 DIAGNOSIS — M545 Low back pain, unspecified: Secondary | ICD-10-CM | POA: Insufficient documentation

## 2024-04-25 DIAGNOSIS — Z79899 Other long term (current) drug therapy: Secondary | ICD-10-CM | POA: Insufficient documentation

## 2024-04-25 LAB — BASIC METABOLIC PANEL WITH GFR
Anion gap: 14 (ref 5–15)
BUN: 91 mg/dL — ABNORMAL HIGH (ref 8–23)
CO2: 23 mmol/L (ref 22–32)
Calcium: 8.7 mg/dL — ABNORMAL LOW (ref 8.9–10.3)
Chloride: 100 mmol/L (ref 98–111)
Creatinine, Ser: 8.49 mg/dL — ABNORMAL HIGH (ref 0.61–1.24)
GFR, Estimated: 6 mL/min — ABNORMAL LOW (ref >60)
Glucose, Bld: 150 mg/dL — ABNORMAL HIGH (ref 70–99)
Potassium: 5.7 mmol/L — ABNORMAL HIGH (ref 3.5–5.1)
Sodium: 137 mmol/L (ref 135–145)

## 2024-04-25 LAB — GLUCOSE, CAPILLARY
Glucose-Capillary: 167 mg/dL — ABNORMAL HIGH (ref 70–99)
Glucose-Capillary: 118 mg/dL — ABNORMAL HIGH (ref 70–99)
Glucose-Capillary: 146 mg/dL — ABNORMAL HIGH (ref 70–99)

## 2024-04-25 LAB — CBC WITH DIFFERENTIAL/PLATELET
Abs Immature Granulocytes: 0.07 10*3/uL (ref 0.00–0.07)
Basophils Absolute: 0.1 10*3/uL (ref 0.0–0.1)
Basophils Relative: 1 %
Eosinophils Absolute: 0.2 10*3/uL (ref 0.0–0.5)
Eosinophils Relative: 2 %
HCT: 23.7 % — ABNORMAL LOW (ref 39.0–52.0)
Hemoglobin: 7.8 g/dL — ABNORMAL LOW (ref 13.0–17.0)
Immature Granulocytes: 1 %
Lymphocytes Relative: 14 %
Lymphs Abs: 1.5 10*3/uL (ref 0.7–4.0)
MCH: 33.3 pg (ref 26.0–34.0)
MCHC: 32.9 g/dL (ref 30.0–36.0)
MCV: 101.3 fL — ABNORMAL HIGH (ref 80.0–100.0)
Monocytes Absolute: 1.1 10*3/uL — ABNORMAL HIGH (ref 0.1–1.0)
Monocytes Relative: 10 %
Neutro Abs: 7.7 10*3/uL (ref 1.7–7.7)
Neutrophils Relative %: 72 %
Platelets: 192 10*3/uL (ref 150–400)
RBC: 2.34 MIL/uL — ABNORMAL LOW (ref 4.22–5.81)
RDW: 15 % (ref 11.5–15.5)
WBC: 10.6 10*3/uL — ABNORMAL HIGH (ref 4.0–10.5)
nRBC: 0 % (ref 0.0–0.2)

## 2024-04-25 LAB — HEMOGLOBIN AND HEMATOCRIT, BLOOD
HCT: 28.9 % — ABNORMAL LOW (ref 39.0–52.0)
Hemoglobin: 9.2 g/dL — ABNORMAL LOW (ref 13.0–17.0)

## 2024-04-25 MED ORDER — LORAZEPAM 2 MG/ML IJ SOLN: 0.5000 mg | Freq: Once | INTRAMUSCULAR | Status: DC | PRN

## 2024-04-25 MED ORDER — LORAZEPAM 2 MG/ML IJ SOLN
0.5000 mg | Freq: Once | INTRAMUSCULAR | Status: AC | PRN
Start: 2024-04-25 — End: 2024-04-25
  Administered 2024-04-25: 0.5 mg via INTRAMUSCULAR
  Filled 2024-04-25: qty 1

## 2024-04-25 NOTE — Progress Notes (Signed)
   04/25/24 1905  Vitals  Temp 98 F (36.7 C)  Pulse Rate 84  Resp 18  BP (!) 96/56  SpO2 99 %  O2 Device Room Air  Weight (S)  78.7 kg (Bed Scale)  Type of Weight Post-Dialysis  Oxygen Therapy  Patient Activity (if Appropriate) In bed  Pulse Oximetry Type Continuous  Post Treatment  Dialyzer Clearance Clear  Hemodialysis Intake (mL) 0 mL  Liters Processed 59.9  Fluid Removed (mL) 1000 mL  Tolerated HD Treatment Yes  Post-Hemodialysis Comments Pt. remains confused and hollering out. Report call to 3W Bedside nurse. Pt gown and pads change. Pt was clean up for a medium Bowel movement.  AVG/AVF Arterial Site Held (minutes) 5 minutes  AVG/AVF Venous Site Held (minutes) 5 minutes   Received patient in bed to unit.  Alert and oriented.  Informed consent signed and in chart.   TX duration: 2.5  Patient tolerated well.  Transported back to the room  Alert, without acute distress.  Hand-off given to patient's nurse.   Access used: Yes Access issues: No  Total UF removed: 1000 Medication(s) given: See MAR Post HD VS: See Above Grid Post HD weight: 78.7 kg   [facility]

## 2024-04-25 NOTE — Progress Notes (Signed)
 Patient is off the unit to HD

## 2024-04-25 NOTE — Progress Notes (Signed)
 Patient refused vitals. The patient stated that they wanted to be left alone.

## 2024-04-25 NOTE — Progress Notes (Signed)
 PROGRESS NOTE  Jacob [name] FMW:[id number] DOB: 01-Jun-1951 DOA: 04/17/2024 PCP: de [place], Raymond J, MD   LOS: 8 days   Brief Narrative / Interim history: 73 y.o. M with ESRD on HD MWF, HIV, HTN, sCHF EF 40-45%, hx apical aneurysm, DM, CAD s/p CABG 2004 last PCI 2021, hep C, hx GIB due to AVMs, still smoking, PVD of RLE, chronic RLE wound who presented with nonhealing right medial calf wound.  Hospitalization complicated by delirium.  Subjective / 24h Interval events: He is not as communicative this morning.  Answers basic questions but does not engage much in the conversation.  Has been refusing vitals and blood work  Assesement and Plan: Principal Problem:   Ischemic leg wound Active Problems:   ESRD (end stage renal disease) (HCC)   Stroke (HCC)   Human immunodeficiency virus (HIV) disease (HCC)   Essential hypertension   Coronary artery disease with angina   DM (diabetes mellitus) (HCC)   Chronic systolic CHF (congestive heart failure) (HCC)   Anemia of chronic disease   Chronic obstructive pulmonary disease (HCC)   Acute metabolic encephalopathy   History of hepatitis C   History of GI bleeding due to AVMs   Malnutrition of moderate degree   Rectal bleeding   Principal problem Ischemic leg wound, Peripheral vascular disease - This was the initial presenting complaint.  Actually he was told to come to the ER by [facility] due to missed appointments with Cardiology. He had normal ABI in Feb, then developed a wound in March.  This progressed so he re-established with the [facility] in April.  They treated it for a month, repeated ABI that now showed high grade stenosis in the RLE.   - Cardiology and vascular surgery consulted on admission, underwent angiography and angioplasty with shockwave lithotripsy on 6/26 with Dr. Gretta -Now optimized, continue aspirin  and Lipitor .  Plavix  had to be on hold yesterday with new onset GI bleed   Active problems  New onset  lower GI bleed -yesterday after he finishes dialysis session.  Patient experienced bright red blood per rectum, large-volume.  Has not had an episode since.  Gastroenterology consulted, however he has refused to engage with the GI team, refused DRE and will let us  do repeat blood work.  For now monitor off Plavix   Acute metabolic encephalopathy, Uremia - Encephalopathy has become the main issue. At baseline, patient with possible dementia, but no diagnosed memory loss and living independently, managing own medicines and dialysis. Here, he was very disoriented on admission, and had waxing and waning confusion/somnolence since.   TSH, ammonia, B12, LFTs, RPR all normal.  CK normal.  No prior COPD.  Obtained MRI that showed strokes, and suspect at this time that his delirium is from stroke superimposed on undiagnosed mild dementia. - Standard delirium precautions: blinds open and lights on during day, TV off, minimize interruptions at night, glasses/hearing aids, PT/OT, avoiding Beers list medications     Stroke [facility]) - MRI brain showed scattered left MCA punctate infarcts. MRA showed no high grade intracranial stenoses. Carotid US  showed no stenoses. Echocardiogram showed reduced EF but no cardiogenic source of embolism. Continue DAPT. Continue Lipitor . Neurology f/u after discharge -Needs SNF, 30-day event monitor at discharge   ESRD - Consult nephrology for HD   Human immunodeficiency virus (HIV) disease (HCC) - HIV quant last Jan undetectable. Continue Biktarvy   History of GI bleeding due to AVMs -new GI bleed as above   History of  hepatitis C - LFTs normal   Chronic obstructive pulmonary disease (HCC) - No evidence of flare, no wheezing. Continue Anoro   Anemia of chronic disease - Hgb stable, no bleeding observed   Chronic systolic CHF (congestive heart failure) (HCC) LV aneurysm resolved. - EF down from 40% to 30-35% here. Hold carvedilol  given soft BP. Volume status per HD   DM (diabetes  mellitus) (HCC) - Glucose controlled.  Continue sliding scale   Coronary artery disease with angina Hypertension - Continue Lipitor  (had been stopped outpatient due to muscle aches) - Hold carvedilol  and Imdur  due to soft BP    Scheduled Meds:  allopurinol   100 mg Oral Daily   aspirin  EC  81 mg Oral Daily   atorvastatin   40 mg Oral Daily   bictegravir-emtricitabine -tenofovir  AF  1 tablet Oral Daily   Chlorhexidine  Gluconate Cloth  6 each Topical Q0600   Chlorhexidine  Gluconate Cloth  6 each Topical Q0600   feeding supplement  237 mL Oral BID BM   heparin   5,000 Units Subcutaneous Q8H   insulin  aspart  0-5 Units Subcutaneous QHS   insulin  aspart  0-9 Units Subcutaneous TID WC   multivitamin  1 tablet Oral QHS   nutrition supplement (JUVEN)  1 packet Oral BID BM   QUEtiapine  25 mg Oral QHS   sodium chloride  flush  3 mL Intravenous Q12H   sodium chloride  flush  3 mL Intravenous Q12H   sodium zirconium cyclosilicate   5 g Oral Daily   umeclidinium-vilanterol  1 puff Inhalation Daily   Continuous Infusions: PRN Meds:.acetaminophen  **OR** acetaminophen , haloperidol, labetalol , ondansetron  **OR** ondansetron  (ZOFRAN ) IV, mouth rinse, oxyCODONE , sodium chloride  flush, sodium chloride  flush  Current Outpatient Medications  Medication Instructions   acetaminophen  (TYLENOL ) 1,300 mg, 3 times daily   albuterol  (PROVENTIL ) 2.5 mg, Nebulization, Every 6 hours PRN   allopurinol  (ZYLOPRIM ) 100 mg, Oral, Daily   atorvastatin  (LIPITOR ) 10 MG tablet TAKE 1 TABLET(10 MG) BY MOUTH DAILY.   B-D UF III MINI PEN NEEDLES 31G X 5 MM MISC 1 Box, Subcutaneous, 4 times daily   BIKTARVY  50-200-25 MG TABS tablet TAKE 1 TABLET BY MOUTH 1 TIME A DAY   Calcium  Acetate 667-2,001 mg, See admin instructions   carvedilol  (COREG ) 3.125 MG tablet TAKE 1 TABLET(3.125 MG) BY MOUTH TWICE DAILY WITH A MEAL   diclofenac  Sodium (VOLTAREN ) 1 % GEL Apply 2 grams topically to the affected area three times daily as needed for  pain.   folic acid  (FOLVITE ) 1 mg, Oral, Daily   gabapentin  (NEURONTIN ) 300 mg, Oral, 2 times daily, Patient is taking 1 tablet in the early evening after dialysis and 1 tablet at bedtime   HumaLOG  KwikPen 15 Units, Subcutaneous, 3 times daily   insulin  degludec (TRESIBA ) 15 Units, Subcutaneous, Daily, 3   isosorbide  mononitrate (IMDUR ) 30 mg, Oral, Daily   latanoprost  (XALATAN ) 0.005 % ophthalmic solution 1 drop, Daily at bedtime   leptospermum manuka honey (MEDIHONEY) PSTE paste 1 Application, Topical, Daily, Cleanse R lower leg wound with Vashe wound cleanser Soila [phone]), do not rinse and allow to air dry. Apply Medihoney to wound bed daily, cover with dry gauze and cover with silicone foam or ABD pad and Kerlix roll gauze to secure.Interchangeable with TheraHoney Apply thin layer (3 mm) to wound.   lidocaine -prilocaine  (EMLA ) cream As directed   Menthol -Camphor (TIGER BALM ARTHRITIS RUB) 11-11 % CREA 1 Application, As needed   midodrine  (PROAMATINE ) 10 mg, 3 times weekly   multivitamin (RENA-VIT) TABS tablet  1 tablet, Daily   neomycin-bacitracin-polymyxin [phone] OINT 1 Application, Apply externally, 2 times daily   nitroGLYCERIN  (NITROSTAT ) 0.3 mg, Sublingual, Every 5 min PRN   octreotide  (SANDOSTATIN  LAR DEPOT) 10 MG injection Inject 10 mg into the muscle every 28 days.   oxyCODONE -acetaminophen  (PERCOCET) 5-325 MG tablet 1 tablet, Oral, Every 8 hours PRN   polyethylene glycol (MIRALAX  / GLYCOLAX ) 17 g, Daily PRN   PREVIDENT 5000 PLUS 1.1 % CREA dental cream 1 Application, Daily at bedtime   simethicone  (MYLICON) 125 mg, As needed   timolol  (TIMOPTIC ) 0.5 % ophthalmic solution 1 drop, Every morning   umeclidinium-vilanterol (ANORO ELLIPTA ) 62.5-25 MCG/ACT AEPB 1 puff, Inhalation, Daily    Diet Orders (From admission, onward)     Start     Ordered   04/24/24 1549  Diet full liquid Room service appropriate? Yes with Assist; Fluid consistency: Thin  Diet effective now        Question Answer Comment  Room service appropriate? Yes with Assist   Fluid consistency: Thin      04/24/24 1549            DVT prophylaxis: heparin  injection 5,000 Units Start: 04/18/24 0600   Lab Results  Component Value Date   PLT 189 04/24/2024      Code Status: Full Code  Family Communication: no family at bedside   Status is: Inpatient Remains inpatient appropriate because: severity of illness  Level of care: Telemetry Medical  Consultants:  Nephrology Vascular Cards  Objective: Vitals:   04/24/24 1221 04/24/24 1636 04/25/24 0323 04/25/24 0801  BP: 101/66 (!) (P) 84/58 101/69   Pulse: 99  (!) 115 92  Resp: 17  19 18   Temp: 97.7 F (36.5 C)  97.7 F (36.5 C)   TempSrc:   Oral   SpO2:   99% 98%  Weight:      Height:        Intake/Output Summary (Last 24 hours) at 04/25/2024 0932 Last data filed at 04/24/2024 1221 Gross per 24 hour  Intake --  Output -0.3 ml  Net 0.3 ml   Wt Readings from Last 3 Encounters:  04/24/24 82.1 kg  03/05/24 89.6 kg  02/22/24 89.3 kg    Examination:  Constitutional: NAD Eyes: lids and conjunctivae normal, no scleral icterus ENMT: mmm Neck: normal, supple Respiratory: clear to auscultation bilaterally, no wheezing, no crackles. Normal respiratory effort.  Cardiovascular: Regular rate and rhythm, no murmurs / rubs / gallops. No LE edema. Abdomen: soft, no distention, no tenderness. Bowel sounds positive.   Data Reviewed: I have independently reviewed following labs and imaging studies   CBC Recent Labs  Lab 04/19/24 0917 04/22/24 0448 04/23/24 1237 04/24/24 0502  WBC 8.2 9.2 9.8 9.0  HGB 12.6* 12.0* 10.8* 11.3*  HCT 39.4 37.1* 33.0* 34.9*  PLT 145* 148* 189 189  MCV 103.4* 101.9* 99.7 98.9  MCH 33.1 33.0 32.6 32.0  MCHC 32.0 32.3 32.7 32.4  RDW 15.1 14.9 14.9 14.8    Recent Labs  Lab 04/18/24 1134 04/19/24 1209 04/19/24 2229 04/20/24 0600 04/20/24 1435 04/21/24 0737 04/21/24 1718 04/22/24 0805  04/23/24 1237 04/24/24 0502  NA  --    < >  --   --    < > 133* 136 136 130* 133*  K  --    < >  --   --    < > 6.1* 5.5* 5.5* 5.6* 6.2*  CL  --    < >  --   --    < >  94* 92* 92* 89* 90*  CO2  --    < >  --   --    < > 21* 26 27 25 25   GLUCOSE  --    < >  --   --    < > 180* 188* 155* 166* 116*  BUN  --    < >  --   --    < > 79* 90* 117* 80* 98*  CREATININE  --    < >  --   --    < > 10.43* 10.97* 12.10* 8.40* 9.59*  CALCIUM   --    < >  --   --    < > 8.9 9.5 9.1 9.3 9.6  ALBUMIN   --   --   --   --   --   --  2.4* 2.2*  --   --   LATICACIDVEN 1.4  --   --   --   --   --   --   --   --  1.5  TSH  --   --  0.656  --   --   --   --   --   --   --   HGBA1C  --   --   --  6.9*  --   --   --   --   --   --   AMMONIA  --   --  25  --   --   --   --   --   --   --    < > = values in this interval not displayed.    ------------------------------------------------------------------------------------------------------------------ No results for input(s): CHOL, HDL, LDLCALC, TRIG, CHOLHDL, LDLDIRECT in the last 72 hours.  Lab Results  Component Value Date   HGBA1C 6.9 (H) 04/20/2024   ------------------------------------------------------------------------------------------------------------------ No results for input(s): TSH, T4TOTAL, T3FREE, THYROIDAB in the last 72 hours.  Invalid input(s): FREET3  Cardiac Enzymes No results for input(s): CKMB, TROPONINI, MYOGLOBIN in the last 168 hours.  Invalid input(s): CK ------------------------------------------------------------------------------------------------------------------    Component Value Date/Time   BNP 2,378.1 (H) 01/23/2024 1931    CBG: Recent Labs  Lab 04/23/24 1634 04/23/24 2123 04/24/24 0631 04/24/24 2154 04/25/24 0623  GLUCAP 148* 221* 124* 149* 167*    Recent Results (from the past 240 hours)  Culture, blood (Routine X 2) w Reflex to ID Panel     Status: None   Collection Time:  04/19/24 10:28 PM   Specimen: BLOOD LEFT HAND  Result Value Ref Range Status   Specimen Description BLOOD LEFT HAND  Final   Special Requests   Final    BOTTLES DRAWN AEROBIC AND ANAEROBIC Blood Culture results may not be optimal due to an inadequate volume of blood received in culture bottles   Culture   Final    NO GROWTH 5 DAYS Performed at [facility], [address] [place], [name] [address]    Report Status 04/24/2024 FINAL  Final  Culture, blood (Routine X 2) w Reflex to ID Panel     Status: None   Collection Time: 04/19/24 10:28 PM   Specimen: BLOOD LEFT HAND  Result Value Ref Range Status   Specimen Description BLOOD LEFT HAND  Final   Special Requests   Final    BOTTLES DRAWN AEROBIC AND ANAEROBIC Blood Culture results may not be optimal due to an inadequate volume of blood received in culture bottles   Culture   Final  NO GROWTH 5 DAYS Performed at [facility], [address] [place], [name] [address]    Report Status 04/24/2024 FINAL  Final     Radiology Studies: No results found.    Nilda Fendt, MD, PhD [facility]  Between 7 am - 7 pm I am available, please contact me via Amion (for emergencies) or Securechat (non urgent messages)  Between 7 pm - 7 am I am not available, please contact night coverage MD/APP via Amion

## 2024-04-25 NOTE — Progress Notes (Signed)
 SLP Cancellation Note  Patient Details Name: Jacob [name] MRN: [id number] DOB: 10-14-1951   Cancelled treatment:       Reason Eval/Treat Not Completed: Fatigue/lethargy limiting ability to participate. Attempted skilled ST intervention for basic attention/memory. Pt sleepy, speech unintelligible, poor responsiveness to questions. Will continue efforts when pt more participatory.  Rasean Joos B. Dory, MSP, CCC-SLP Speech Language Pathologist Office: [phone]  Dory Caprice Daring 04/25/2024, 11:06 AM

## 2024-04-25 NOTE — Progress Notes (Signed)
 Occupational Therapy Treatment Patient Details Name: Jacob [name] MRN: [id number] DOB: Nov 30, 1950 Today's Date: 04/25/2024   History of present illness Pt is 73 yo presenting to [facility] ED on 6/25 due to nonhealing R medial calf wound. 6/26 s/p angioplasty. 6/27 Brain MRI showed multiple small acute or early subacute infarcts in L midbrain and R frontoparietal white matter. PMH - ESRD on HD, HIV, DM2 with neuropathy, HTN, and chronic diabetic ulcer of R foot.   OT comments  Pt noted to have a different clinical presentation today compared to IE. Pt requiring max A +2 to stand and following less commands than before, he was noted to be more confused recently. Pt noted to have R facial droop and more expressive aphasia than before. Notified RN and MD of presentation, RN in room performing assessment. MD noted that there will no change in therapy plan.       If plan is discharge home, recommend the following:  A lot of help with walking and/or transfers;A little help with bathing/dressing/bathroom;Assistance with cooking/housework;Supervision due to cognitive status   Equipment Recommendations  BSC/3in1;Tub/shower seat;Other (comment) (Rollator; if person one is not found)    Recommendations for Other Services      Precautions / Restrictions Precautions Precautions: Fall Recall of Precautions/Restrictions: Impaired Restrictions Weight Bearing Restrictions Per Provider Order: No       Mobility Bed Mobility Overal bed mobility: Needs Assistance Bed Mobility: Supine to Sit, Sit to Supine     Supine to sit: +2 for safety/equipment, Max assist Sit to supine: Total assist, +2 for physical assistance, +2 for safety/equipment   General bed mobility comments: attempted to bring LE's off EOB with assist needed to complete movement. Assist to raise trunk and shift hips forward.    Transfers Overall transfer level: Needs assistance Equipment used: Rolling walker (2 wheels) Transfers: Sit  to/from Stand Sit to Stand: Max assist, +2 physical assistance, +2 safety/equipment           General transfer comment: MaxAx2 for boost-up and steadying. Assist needed to bring hand to RW. Able to stand for ~5 secs before returning to supine     Balance Overall balance assessment: Needs assistance Sitting-balance support: Feet supported, Bilateral upper extremity supported Sitting balance-Leahy Scale: Fair   Postural control: Posterior lean Standing balance support: Single extremity supported Standing balance-Leahy Scale: Zero Standing balance comment: MaxAx2 and RW                           ADL either performed or assessed with clinical judgement   ADL       Grooming: Sitting;Contact guard assist;Wash/dry face                                      Extremity/Trunk Assessment Upper Extremity Assessment Upper Extremity Assessment: Generalized weakness;RUE deficits/detail;LUE deficits/detail;Difficult to assess due to impaired cognition RUE Deficits / Details: able to partially lift against gravity but not able to maintain shld flex position LUE Deficits / Details: able to partially lift against gravity but not able to maintain shld flex position       Cervical / Trunk Assessment Cervical / Trunk Assessment: Kyphotic    Vision   Additional Comments: pt not following visual stimuli to assess ROM and tracking.   Perception     Praxis Praxis Praxis: Impaired Praxis Impairment Details: Initiation   Communication Communication  Communication: Impaired Factors Affecting Communication: Reduced clarity of speech   Cognition Arousal: Lethargic Behavior During Therapy: Flat affect, Restless Cognition: Difficult to assess Difficult to assess due to: Impaired communication   Awareness: Intellectual awareness intact, Online awareness impaired   Attention impairment (select first level of impairment): Focused attention, Sustained  attention Executive functioning impairment (select all impairments): Initiation, Problem solving OT - Cognition Comments: Pt noted to have more trouble expressing himself compared to baseline, following 30-40% of commands and impaired initation. Notified RN to come and assess pt, also messaged MD of pt change in presentation. Not answering all questions regarding orientation.                 Following commands: Impaired Following commands impaired: Follows one step commands inconsistently      Cueing   Cueing Techniques: Verbal cues, Tactile cues, Visual cues  Exercises      Shoulder Instructions       General Comments Messaged MD about change in clinical presentation of pt, notified RN. She is in room to assess.    Pertinent Vitals/ Pain       Pain Assessment Pain Assessment: Faces Faces Pain Scale: Hurts little more Pain Location: pt unable to localize pain, groans with movement Pain Descriptors / Indicators: Discomfort, Grimacing, Guarding Pain Intervention(s): Monitored during session, Limited activity within patient's tolerance, Repositioned  Home Living                                          Prior Functioning/Environment              Frequency  Min 2X/week        Progress Toward Goals  OT Goals(current goals can now be found in the care plan section)  Progress towards OT goals: Progressing toward goals  Acute Rehab OT Goals Patient Stated Goal: To get stronger OT Goal Formulation: With patient Time For Goal Achievement: 05/04/24 Potential to Achieve Goals: Good  Plan      Co-evaluation      Reason for Co-Treatment: Complexity of the patient's impairments (multi-system involvement);Necessary to address cognition/behavior during functional activity;For patient/therapist safety;To address functional/ADL transfers PT goals addressed during session: Mobility/safety with mobility;Balance;Proper use of DME OT goals addressed  during session: ADL's and self-care;Strengthening/ROM      AM-PAC OT 6 Clicks Daily Activity     Outcome Measure   Help from another person eating meals?: None Help from another person taking care of personal grooming?: A Little Help from another person toileting, which includes using toliet, bedpan, or urinal?: A Lot Help from another person bathing (including washing, rinsing, drying)?: A Little Help from another person to put on and taking off regular upper body clothing?: A Little Help from another person to put on and taking off regular lower body clothing?: A Little 6 Click Score: 18    End of Session Equipment Utilized During Treatment: Gait belt;Rolling walker (2 wheels)  OT Visit Diagnosis: Unsteadiness on feet (R26.81);Other abnormalities of gait and mobility (R26.89);Pain;Muscle weakness (generalized) (M62.81) Pain - Right/Left: Right Pain - part of body: Leg   Activity Tolerance Patient tolerated treatment well   Patient Left with call bell/phone within reach;with family/visitor present;in bed;with bed alarm set   Nurse Communication Mobility status        Time: 8991-8965 OT Time Calculation (min): 26 min  Charges: OT General Charges $OT Visit: 1  Visit OT Treatments $Therapeutic Activity: 8-22 mins  04/25/2024  AB, OTR/L  Acute Rehabilitation Services  Office: [phone]   Curtistine JONETTA Das 04/25/2024, 12:33 PM

## 2024-04-25 NOTE — TOC Progression Note (Signed)
 Transition of Care [facility]) - Progression Note    Patient Details  Name: Jacob [name] MRN: [id number] Date of Birth: 1951-10-16  Transition of Care [facility]) CM/SW Contact  [name], [name] Phone Number: 04/25/2024, 11:04 AM  Clinical Narrative:   CSW continuing to follow for disposition. Patient not medically ready, unable to tolerate outpatient dialysis, refusing care, ongoing discussions regarding goals of care. CSW to follow.    Expected Discharge Plan: Memory Care Barriers to Discharge: Continued Medical Work up, English as a second language teacher, Other (must enter comment), Waiting for outpatient dialysis  Expected Discharge Plan and Services                                               Social Determinants of Health (SDOH) Interventions SDOH Screenings   Food Insecurity: Patient Declined (04/18/2024)  Housing: Unknown (02/27/2024)  Transportation Needs: No Transportation Needs (02/27/2024)  Utilities: Not At Risk (02/27/2024)  Depression (PHQ2-9): Low Risk  (03/05/2024)  Financial Resource Strain: Low Risk  (10/26/2020)  Physical Activity: Insufficiently Active (06/26/2018)  Social Connections: Somewhat Isolated (06/26/2018)  Stress: No Stress Concern Present (02/27/2024)  Tobacco Use: High Risk (04/17/2024)  Health Literacy: Adequate Health Literacy (02/27/2024)    Readmission Risk Interventions    06/15/2023    2:18 PM  Readmission Risk Prevention Plan  Transportation Screening Complete  Medication Review (RN Care Manager) Referral to Pharmacy  PCP or Specialist appointment within 3-5 days of discharge Complete  HRI or Home Care Consult Complete  SW Recovery Care/Counseling Consult Complete  Palliative Care Screening Not Applicable  Skilled Nursing Facility Not Applicable

## 2024-04-25 NOTE — Progress Notes (Addendum)
 [facility] Progress Note  Subjective:  Patient had bloody bowel movement on dialysis yesterday.  Infiltrated and had to stop early.  Also had issues with venous pressures. Evaluated by GI.  Difficult decision making given his overall clinical status. Patient minimally interactive with no complaints this morning.  Vitals:   04/24/24 1221 04/24/24 1636 04/25/24 0323 04/25/24 0801  BP: 101/66 (!) (P) 84/58 101/69   Pulse: 99  (!) 115 92  Resp: 17  19 18   Temp: 97.7 F (36.5 C)  97.7 F (36.5 C)   TempSrc:   Oral   SpO2:   99% 98%  Weight:      Height:        Exam: Gen tired but wakes to voice, no distress No jvd or bruits Bilateral chest rise with no increased work of breathing Normal rate, no rub Abd soft ntnd no mass or ascites +bs MS bilat feet are wrapped Minimal edema in the lower extremities at the midshin Neuro lethargic but nonfocal    RFA AVG+bruit         OP HD: [facility] MWF 3h  B400   86kg  2K  R AVG  HEparin  none Last OP HD 6/25, post wt 86.6kg Gets to dry wt, good compliance Low BP's in the 70-90s on HD       Assessment/ Plan: Ischemic wound right foot: per pmd.  He underwent angiography with angioplasty and shockwave lithotripsy on 6/26 with Dr. Gretta AMS: neurology consulting, 3 separate lacunar strokes acute/ subacute, suspected embolic. However not improving significantly.  Overall concerning.  Has had demise in the outpatient setting as well ESRD: on HD MWF. Ongoing GOC conversations cannot tolerate dialysis in the outpatient setting because of his altered mental status. BUN high and K high due to high catabolic state but unclear cause for this. High protein consumption could be a factor.  Treatment cut short on Wednesdays and planning for dialysis today.  Then Saturday and likely return to MWF schedule next week BP: not on BP lowering meds, BP's stable low-normal.  Hyperkalemia: Control with dialysis and started lokelma  5g  daily. Hypoxia/ volume: UF with HD Anemia of esrd: Hb 11-13 here, follow.  CM EF 30-35% by echo here GOC: Have been discussed with Sister Aldona previously.  Continue conversations.  Does not have any options of his mental status does not improve GI bleeding: Unclear cause but likely something colonic.  Unclear if he would tolerate colonoscopy well.  GI consulted.  Appreciate help  Recent Labs  Lab 04/21/24 1718 04/22/24 0448 04/22/24 0805 04/23/24 1237 04/24/24 0502  HGB  --    < >  --  10.8* 11.3*  ALBUMIN  2.4*  --  2.2*  --   --   CALCIUM  9.5  --  9.1 9.3 9.6  PHOS 6.8*  --  7.2*  --   --   CREATININE 10.97*  --  12.10* 8.40* 9.59*  K 5.5*  --  5.5* 5.6* 6.2*   < > = values in this interval not displayed.   No results for input(s): IRON , TIBC, FERRITIN in the last 168 hours. Inpatient medications:  allopurinol   100 mg Oral Daily   aspirin  EC  81 mg Oral Daily   atorvastatin   40 mg Oral Daily   bictegravir-emtricitabine -tenofovir  AF  1 tablet Oral Daily   Chlorhexidine  Gluconate Cloth  6 each Topical Q0600   Chlorhexidine  Gluconate Cloth  6 each Topical Q0600   feeding supplement  237 mL  Oral BID BM   heparin   5,000 Units Subcutaneous Q8H   insulin  aspart  0-5 Units Subcutaneous QHS   insulin  aspart  0-9 Units Subcutaneous TID WC   multivitamin  1 tablet Oral QHS   nutrition supplement (JUVEN)  1 packet Oral BID BM   QUEtiapine  25 mg Oral QHS   sodium chloride  flush  3 mL Intravenous Q12H   sodium chloride  flush  3 mL Intravenous Q12H   sodium zirconium cyclosilicate   5 g Oral Daily   umeclidinium-vilanterol  1 puff Inhalation Daily      acetaminophen  **OR** acetaminophen , haloperidol, labetalol , ondansetron  **OR** ondansetron  (ZOFRAN ) IV, mouth rinse, oxyCODONE , sodium chloride  flush, sodium chloride  flush

## 2024-04-25 NOTE — Progress Notes (Signed)
 Physical Therapy Treatment Patient Details Name: Jacob [name] MRN: [id number] DOB: February 10, 1951 Today's Date: 04/25/2024   History of Present Illness Pt is 73 yo presenting to [facility] ED on 6/25 due to nonhealing R medial calf wound. 6/26 s/p angioplasty. 6/27 Brain MRI showed multiple small acute or early subacute infarcts in L midbrain and R frontoparietal white matter. PMH - ESRD on HD, HIV, DM2 with neuropathy, HTN, and chronic diabetic ulcer of R foot.   PT Comments  Pt presented to therapy session with increased confusion and difficulty following single step commands. Difficulty understanding pt with incomprehensible speech. Pt followed commands approximately 40% of the time with multiple cues needed to adhere to task. Pt required increased assistance for mobility this date with decreased initiation. MaxAx2/TotalAx2 for bed mobility and MaxAx2 with RW to stand. Pt was only able to stand for ~5 secs before slowly lowering back to the bed. Discussed with RN and MD changes in cognition with RN in room at end of session. Continue to recommend <3hrs post acute rehab. Acute PT to follow.    If plan is discharge home, recommend the following: Assistance with cooking/housework;Assist for transportation;Help with stairs or ramp for entrance;A lot of help with walking and/or transfers;A little help with bathing/dressing/bathroom   Can travel by private vehicle     No  Equipment Recommendations  BSC/3in1       Precautions / Restrictions Precautions Precautions: Fall Recall of Precautions/Restrictions: Impaired Restrictions Weight Bearing Restrictions Per Provider Order: No     Mobility  Bed Mobility Overal bed mobility: Needs Assistance Bed Mobility: Supine to Sit, Sit to Supine     Supine to sit: +2 for safety/equipment, Max assist Sit to supine: Total assist, +2 for physical assistance, +2 for safety/equipment   General bed mobility comments: attempted to bring LE's off EOB with assist  needed to complete movement. Assist to raise trunk and shift hips forward.    Transfers Overall transfer level: Needs assistance Equipment used: Rolling walker (2 wheels) Transfers: Sit to/from Stand Sit to Stand: Max assist, +2 physical assistance, +2 safety/equipment    General transfer comment: MaxAx2 for boost-up and steadying. Assist needed to bring hand to RW. Able to stand for ~5 secs before returning to supine    Ambulation/Gait    General Gait Details: unable this date   Modified Rankin (Stroke Patients Only) Modified Rankin (Stroke Patients Only) Pre-Morbid Rankin Score: No symptoms Modified Rankin: Severe disability     Balance Overall balance assessment: Needs assistance Sitting-balance support: Feet supported, Bilateral upper extremity supported Sitting balance-Leahy Scale: Fair   Postural control: Posterior lean Standing balance support: Bilateral upper extremity supported, During functional activity, Reliant on assistive device for balance Standing balance-Leahy Scale: Zero Standing balance comment: MaxAx2 and RW       Communication Communication Communication: Impaired Factors Affecting Communication: Reduced clarity of speech  Cognition Arousal: Lethargic Behavior During Therapy: Flat affect, Restless   PT - Cognitive impairments: Awareness, Attention, Initiation, Sequencing, Problem solving, Safety/Judgement, Orientation, Memory   Orientation impairments: Place, Situation, Time (oriented to year)          PT - Cognition Comments: Oriented to self, birthdate, and current year with incomprehensible speech when asked about location. Intermittently followed commands requiring multiple cues to adhere to task and to respond to therapists Following commands: Impaired Following commands impaired: Follows one step commands inconsistently    Cueing Cueing Techniques: Verbal cues, Tactile cues, Visual cues  Exercises General Exercises - Lower  Extremity Long  Arc Quad: AROM, Right, 5 reps, Seated        Pertinent Vitals/Pain Pain Assessment Pain Assessment: Faces Faces Pain Scale: Hurts little more Pain Location: pt unable to localize pain, groans with movement Pain Descriptors / Indicators: Discomfort, Grimacing, Guarding Pain Intervention(s): Monitored during session, Limited activity within patient's tolerance, Repositioned     PT Goals (current goals can now be found in the care plan section) Acute Rehab PT Goals PT Goal Formulation: Patient unable to participate in goal setting Time For Goal Achievement: 05/03/24 Potential to Achieve Goals: Fair Progress towards PT goals: Progressing toward goals    Frequency    Min 2X/week           Co-evaluation   Reason for Co-Treatment: Complexity of the patient's impairments (multi-system involvement);Necessary to address cognition/behavior during functional activity;For patient/therapist safety;To address functional/ADL transfers PT goals addressed during session: Mobility/safety with mobility;Balance;Proper use of DME OT goals addressed during session: ADL's and self-care;Strengthening/ROM      AM-PAC PT 6 Clicks Mobility   Outcome Measure  Help needed turning from your back to your side while in a flat bed without using bedrails?: A Lot Help needed moving from lying on your back to sitting on the side of a flat bed without using bedrails?: A Lot Help needed moving to and from a bed to a chair (including a wheelchair)?: Total Help needed standing up from a chair using your arms (e.g., wheelchair or bedside chair)?: Total Help needed to walk in hospital room?: Total Help needed climbing 3-5 steps with a railing? : Total 6 Click Score: 8    End of Session Equipment Utilized During Treatment: Gait belt Activity Tolerance: Other (comment) (impaired cognition) Patient left: in bed;with call bell/phone within reach;with bed alarm set;with nursing/sitter in  room Nurse Communication: Mobility status (increased confusion and difficulty following commands, in room at end of session) PT Visit Diagnosis: Other abnormalities of gait and mobility (R26.89)     Time: 8991-8965 PT Time Calculation (min) (ACUTE ONLY): 26 min  Charges:    $Therapeutic Activity: 8-22 mins PT General Charges $$ ACUTE PT VISIT: 1 Visit                     Kate ORN, PT, DPT Secure Chat Preferred  Rehab Office [phone]   Kate BRAVO Wendolyn 04/25/2024, 11:33 AM

## 2024-04-26 DIAGNOSIS — S81801A Unspecified open wound, right lower leg, initial encounter: Secondary | ICD-10-CM | POA: Diagnosis not present

## 2024-04-26 LAB — CBC
HCT: 26.6 % — ABNORMAL LOW (ref 39.0–52.0)
Hemoglobin: 8.5 g/dL — ABNORMAL LOW (ref 13.0–17.0)
MCH: 32.3 pg (ref 26.0–34.0)
MCHC: 32 g/dL (ref 30.0–36.0)
MCV: 101.1 fL — ABNORMAL HIGH (ref 80.0–100.0)
Platelets: 236 K/uL (ref 150–400)
RBC: 2.63 MIL/uL — ABNORMAL LOW (ref 4.22–5.81)
RDW: 15.1 % (ref 11.5–15.5)
WBC: 13.5 K/uL — ABNORMAL HIGH (ref 4.0–10.5)
nRBC: 0 % (ref 0.0–0.2)

## 2024-04-26 LAB — HEMOGLOBIN AND HEMATOCRIT, BLOOD
HCT: 26 % — ABNORMAL LOW (ref 39.0–52.0)
HCT: 26.3 % — ABNORMAL LOW (ref 39.0–52.0)
HCT: 26.5 % — ABNORMAL LOW (ref 39.0–52.0)
Hemoglobin: 8.3 g/dL — ABNORMAL LOW (ref 13.0–17.0)
Hemoglobin: 8.5 g/dL — ABNORMAL LOW (ref 13.0–17.0)
Hemoglobin: 8.8 g/dL — ABNORMAL LOW (ref 13.0–17.0)

## 2024-04-26 LAB — COMPREHENSIVE METABOLIC PANEL WITH GFR
ALT: 29 U/L (ref 0–44)
AST: 80 U/L — ABNORMAL HIGH (ref 15–41)
Albumin: 2.4 g/dL — ABNORMAL LOW (ref 3.5–5.0)
Alkaline Phosphatase: 75 U/L (ref 38–126)
Anion gap: 20 — ABNORMAL HIGH (ref 5–15)
BUN: 55 mg/dL — ABNORMAL HIGH (ref 8–23)
CO2: 20 mmol/L — ABNORMAL LOW (ref 22–32)
Calcium: 9.1 mg/dL (ref 8.9–10.3)
Chloride: 93 mmol/L — ABNORMAL LOW (ref 98–111)
Creatinine, Ser: 6.13 mg/dL — ABNORMAL HIGH (ref 0.61–1.24)
GFR, Estimated: 9 mL/min — ABNORMAL LOW (ref >60)
Glucose, Bld: 164 mg/dL — ABNORMAL HIGH (ref 70–99)
Potassium: 6.1 mmol/L — ABNORMAL HIGH (ref 3.5–5.1)
Sodium: 133 mmol/L — ABNORMAL LOW (ref 135–145)
Total Bilirubin: 1.7 mg/dL — ABNORMAL HIGH (ref 0.0–1.2)
Total Protein: 7 g/dL (ref 6.5–8.1)

## 2024-04-26 LAB — GLUCOSE, CAPILLARY
Glucose-Capillary: 174 mg/dL — ABNORMAL HIGH (ref 70–99)
Glucose-Capillary: 170 mg/dL — ABNORMAL HIGH (ref 70–99)
Glucose-Capillary: 152 mg/dL — ABNORMAL HIGH (ref 70–99)
Glucose-Capillary: 121 mg/dL — ABNORMAL HIGH (ref 70–99)
Glucose-Capillary: 127 mg/dL — ABNORMAL HIGH (ref 70–99)

## 2024-04-26 LAB — MAGNESIUM: Magnesium: 2.3 mg/dL (ref 1.7–2.4)

## 2024-04-26 MED ORDER — SODIUM ZIRCONIUM CYCLOSILICATE 10 G PO PACK
10.0000 g | PACK | Freq: Two times a day (BID) | ORAL | Status: DC
  Administered 2024-04-26 – 2024-04-27 (×3): 10 g via ORAL
  Filled 2024-04-26 (×3): qty 1

## 2024-04-26 NOTE — Plan of Care (Signed)
  Problem: Fluid Volume: Goal: Ability to maintain a balanced intake and output will improve Outcome: Progressing   Problem: Metabolic: Goal: Ability to maintain appropriate glucose levels will improve Outcome: Progressing   Problem: Nutritional: Goal: Progress toward achieving an optimal weight will improve Outcome: Progressing   Problem: Skin Integrity: Goal: Risk for impaired skin integrity will decrease Outcome: Progressing   Problem: Tissue Perfusion: Goal: Adequacy of tissue perfusion will improve Outcome: Progressing   Problem: Clinical Measurements: Goal: Ability to maintain clinical measurements within normal limits will improve Outcome: Progressing Goal: Will remain free from infection Outcome: Progressing Goal: Diagnostic test results will improve Outcome: Progressing Goal: Respiratory complications will improve Outcome: Progressing Goal: Cardiovascular complication will be avoided Outcome: Progressing   Problem: Activity: Goal: Risk for activity intolerance will decrease Outcome: Progressing   Problem: Nutrition: Goal: Adequate nutrition will be maintained Outcome: Progressing   Problem: Elimination: Goal: Will not experience complications related to urinary retention Outcome: Progressing   Problem: Pain Managment: Goal: General experience of comfort will improve and/or be controlled Outcome: Progressing   Problem: Skin Integrity: Goal: Risk for impaired skin integrity will decrease Outcome: Progressing   Problem: Education: Goal: Understanding of CV disease, CV risk reduction, and recovery process will improve Outcome: Progressing Goal: Individualized Educational Video(s) Outcome: Progressing   Problem: Activity: Goal: Ability to return to baseline activity level will improve Outcome: Progressing   Problem: Cardiovascular: Goal: Ability to achieve and maintain adequate cardiovascular perfusion will improve Outcome: Progressing Goal:  Vascular access site(s) Level 0-1 will be maintained Outcome: Progressing   Problem: Health Behavior/Discharge Planning: Goal: Ability to safely manage health-related needs after discharge will improve Outcome: Progressing   Problem: Ischemic Stroke/TIA Tissue Perfusion: Goal: Complications of ischemic stroke/TIA will be minimized Outcome: Progressing

## 2024-04-26 NOTE — Plan of Care (Signed)
     Referral received for Jacob [name]: goals of care discussion. Chart reviewed and updates received from sitter and attending provider. Patient assessed and is unable to engage appropriately in discussions.   I was able to speak with patient's sister Jacob [name]. GOC meeting scheduled for Saturday 04/27/2024 @ 1:00 pm. Family is aware we will meet at patient's bedside.   Detailed note and recommendations to follow once GOC has been completed.   Thank you for your referral and allowing PMT to assist in Jacob [name]'s care.   Stephane Palin, NP Palliative Medicine Team  Team Phone # [phone]   NO CHARGE

## 2024-04-26 NOTE — Progress Notes (Addendum)
 Patient is alert to self only. At beginning of shift patient began screaming out Hey!!!!! into hallway and moaning. Despite going into room trying to assist, patient would not state what was wrong. Asked him if he was in pain and he screamed yes! I administered his pain and night meds. Patient continued to scream but in addition, he began to repeatedly get out of bed, pull off clothes and at dressing on leg. Feared that I could not keep patient safe due to him repeatedly getting out of bed,agitation and restlessness. Security had to be called once to try and calm patient down. Behavior continued so charge nurse paged hospitalist and received an order for ativan  IM. While administering dose patient began swinging his arms. Was able to administer w/staff assistance. Patient's behavior did not improve. Paged MD again and received order for 1:1 sitter and bilateral wrist restraints. Called patient's sister listed on chart and informed her of his behavior and the application of the restraints. Sister expressed that he had previous behaviors similar to tonight. Patient has black, tarry stools. Read his notes and saw that attending MD is aware of his GI bleed but that patient will not cooperate with GI consulting provider. Day nurse reported that patient would not keep his tele monitor on so they removed it. Patient would not cooperate tonight for dressing change, NIH assessment,  q2h turns or complete physical assessment.

## 2024-04-26 NOTE — Progress Notes (Signed)
 [facility] Progress Note  Subjective:  Patient lethargic and minimally interactive.  Continues to have behavioral issues on dialysis but this is unchanged.  Vitals:   04/26/24 0021 04/26/24 0306 04/26/24 0500 04/26/24 0740  BP: (!) 114/56 113/77  91/62  Pulse: 84 77  (!) 103  Resp: 18 18  18   Temp: 98.1 F (36.7 C) 98.2 F (36.8 C)  97.8 F (36.6 C)  TempSrc: Axillary Axillary  Oral  SpO2: 95% 98%    Weight:   78.2 kg   Height:        Exam: Gen minimally interactive, no distress No jvd or bruits Bilateral chest rise with no increased work of breathing Normal rate, no rub Abd soft ntnd no mass or ascites +bs MS bilat feet are wrapped Minimal edema in the lower extremities at the midshin Neuro lethargic but nonfocal    RFA AVG+bruit         OP HD: [facility] MWF 3h  B400   86kg  2K  R AVG  HEparin  none Last OP HD 6/25, post wt 86.6kg Gets to dry wt, good compliance Low BP's in the 70-90s on HD       Assessment/ Plan: Ischemic wound right foot: per pmd.  He underwent angiography with angioplasty and shockwave lithotripsy on 6/26 with Dr. Gretta AMS: neurology consulting, 3 separate lacunar strokes acute/ subacute, suspected embolic. However not improving significantly.  Overall concerning.  Has had demise in the outpatient setting as well ESRD: on HD MWF. Ongoing GOC conversations cannot tolerate dialysis in the outpatient setting because of his altered mental status. BUN high and K high due to high catabolic state but unclear cause for this. High protein consumption could be a factor.  Off schedule this week so plan for dialysis again Saturday and then Monday BP: not on BP lowering meds, BP's stable low-normal.  Hyperkalemia: Persistent issue.  Some GI bleeding with reabsorption is possibly contributing.  Increase Lokelma  to 10 g twice daily.  Dialysis as above Hypoxia/ volume: UF with HD Anemia of esrd: Hb 11-13 here, follow.  CM EF 30-35% by echo  here GOC: Have been discussed with Sister Aldona previously.  Continue conversations.  Would require a sitter at dialysis with his current mental status but also unclear if he would be able to tolerate dialysis in a chair. GI bleeding: Unclear cause but likely something colonic.  Unclear if he would tolerate colonoscopy well.  GI consulted.  Appreciate help  Recent Labs  Lab 04/21/24 1718 04/22/24 0448 04/22/24 0805 04/23/24 1237 04/25/24 1549 04/25/24 2334 04/26/24 0345 04/26/24 0756  HGB  --    < >  --    < > 7.8*   < > 8.5* 8.5*  ALBUMIN  2.4*  --  2.2*  --   --   --  2.4*  --   CALCIUM  9.5  --  9.1   < > 8.7*  --  9.1  --   PHOS 6.8*  --  7.2*  --   --   --   --   --   CREATININE 10.97*  --  12.10*   < > 8.49*  --  6.13*  --   K 5.5*  --  5.5*   < > 5.7*  --  6.1*  --    < > = values in this interval not displayed.   No results for input(s): IRON , TIBC, FERRITIN in the last 168 hours. Inpatient medications:  allopurinol   100  mg Oral Daily   aspirin  EC  81 mg Oral Daily   atorvastatin   40 mg Oral Daily   bictegravir-emtricitabine -tenofovir  AF  1 tablet Oral Daily   Chlorhexidine  Gluconate Cloth  6 each Topical Q0600   Chlorhexidine  Gluconate Cloth  6 each Topical Q0600   feeding supplement  237 mL Oral BID BM   heparin   5,000 Units Subcutaneous Q8H   insulin  aspart  0-5 Units Subcutaneous QHS   insulin  aspart  0-9 Units Subcutaneous TID WC   multivitamin  1 tablet Oral QHS   nutrition supplement (JUVEN)  1 packet Oral BID BM   QUEtiapine   25 mg Oral QHS   sodium chloride  flush  3 mL Intravenous Q12H   sodium chloride  flush  3 mL Intravenous Q12H   sodium zirconium cyclosilicate   10 g Oral BID   umeclidinium-vilanterol  1 puff Inhalation Daily      acetaminophen  **OR** acetaminophen , labetalol , ondansetron  **OR** ondansetron  (ZOFRAN ) IV, mouth rinse, oxyCODONE , sodium chloride  flush, sodium chloride  flush

## 2024-04-26 NOTE — Progress Notes (Signed)
 PROGRESS NOTE  Jacob [name] FMW:[id number] DOB: [birth year] DOA: 04/17/2024 PCP: de [place], Raymond J, MD   LOS: 9 days   Brief Narrative / Interim history: 73 y.o. M with ESRD on HD MWF, HIV, HTN, sCHF EF 40-45%, hx apical aneurysm, DM, CAD s/p CABG 2004 last PCI 2021, hep C, hx GIB due to AVMs, still smoking, PVD of RLE, chronic RLE wound who presented with nonhealing right medial calf wound.  Hospitalization complicated by delirium.  Subjective / 24h Interval events: He is waking up, answer basic questions but does not engage in conversation  Assesement and Plan: Principal Problem:   Ischemic leg wound Active Problems:   ESRD (end stage renal disease) (HCC)   Stroke (HCC)   Human immunodeficiency virus (HIV) disease (HCC)   Essential hypertension   Coronary artery disease with angina   DM (diabetes mellitus) (HCC)   Chronic systolic CHF (congestive heart failure) (HCC)   Anemia of chronic disease   Chronic obstructive pulmonary disease (HCC)   Acute metabolic encephalopathy   History of hepatitis C   History of GI bleeding due to AVMs   Malnutrition of moderate degree   Rectal bleeding   Principal problem Ischemic leg wound, Peripheral vascular disease - This was the initial presenting complaint.  Actually he was told to come to the ER by [facility] due to missed appointments with Cardiology. He had normal ABI in Feb, then developed a wound in March.  This progressed so he re-established with the [facility] in April.  They treated it for a month, repeated ABI that now showed high grade stenosis in the RLE.   - Cardiology and vascular surgery consulted on admission, underwent angiography and angioplasty with shockwave lithotripsy on 6/26 with Dr. Gretta -Now optimized, continue aspirin  and Lipitor .  Plavix  had to be on hold since 7/2 with new onset GI bleed.  Hemoglobin now stable, GI bleed has not recurred and may be able to resume Plavix  tomorrow   Active  problems  New onset lower GI bleed -7/2, after he finishes dialysis session.  Patient experienced bright red blood per rectum, large-volume.  Has not had an episode since.  Gastroenterology consulted, however he has refused to engage with the GI team, refused DRE and GI will watch from afar.  Hemoglobin stable off Plavix   Acute metabolic encephalopathy, Uremia - Encephalopathy has become the main issue. At baseline, patient with possible dementia, but no diagnosed memory loss and living independently, managing own medicines and dialysis. Here, he was very disoriented on admission, and had waxing and waning confusion/somnolence since.   TSH, ammonia, B12, LFTs, RPR all normal.  CK normal.  No prior COPD.  Obtained MRI that showed strokes, and suspect at this time that his delirium is from stroke superimposed on undiagnosed mild dementia. - Standard delirium precautions: blinds open and lights on during day, TV off, minimize interruptions at night, glasses/hearing aids, PT/OT, avoiding Beers list medications     Stroke [facility]) - MRI brain showed scattered left MCA punctate infarcts. MRA showed no high grade intracranial stenoses. Carotid US  showed no stenoses. Echocardiogram showed reduced EF but no cardiogenic source of embolism. Continue DAPT. Continue Lipitor . Neurology f/u after discharge -Needs SNF, 30-day event monitor at discharge   Hyperkalemia-in the setting of renal disease, Lokelma  per nephrology  ESRD - Consult nephrology for HD   Human immunodeficiency virus (HIV) disease (HCC) - HIV quant last Jan undetectable. Continue Biktarvy   History of GI bleeding due  to AVMs -new GI bleed as above   History of hepatitis C - LFTs normal   Chronic obstructive pulmonary disease (HCC) - No evidence of flare, no wheezing. Continue Anoro   Anemia of chronic disease - Hgb stable, no bleeding observed   Chronic systolic CHF (congestive heart failure) (HCC) LV aneurysm resolved. - EF down from 40% to  30-35% here. Hold carvedilol  given soft BP. Volume status per HD   DM (diabetes mellitus) (HCC) - Glucose controlled.  Continue sliding scale   Coronary artery disease with angina Hypertension - Continue Lipitor  (had been stopped outpatient due to muscle aches) - Hold carvedilol  and Imdur  due to soft BP    Scheduled Meds:  allopurinol   100 mg Oral Daily   aspirin  EC  81 mg Oral Daily   atorvastatin   40 mg Oral Daily   bictegravir-emtricitabine -tenofovir  AF  1 tablet Oral Daily   Chlorhexidine  Gluconate Cloth  6 each Topical Q0600   Chlorhexidine  Gluconate Cloth  6 each Topical Q0600   feeding supplement  237 mL Oral BID BM   heparin   5,000 Units Subcutaneous Q8H   insulin  aspart  0-5 Units Subcutaneous QHS   insulin  aspart  0-9 Units Subcutaneous TID WC   multivitamin  1 tablet Oral QHS   nutrition supplement (JUVEN)  1 packet Oral BID BM   QUEtiapine   25 mg Oral QHS   sodium chloride  flush  3 mL Intravenous Q12H   sodium chloride  flush  3 mL Intravenous Q12H   sodium zirconium cyclosilicate   10 g Oral BID   umeclidinium-vilanterol  1 puff Inhalation Daily   Continuous Infusions: PRN Meds:.acetaminophen  **OR** acetaminophen , labetalol , ondansetron  **OR** ondansetron  (ZOFRAN ) IV, mouth rinse, oxyCODONE , sodium chloride  flush, sodium chloride  flush  Current Outpatient Medications  Medication Instructions   acetaminophen  (TYLENOL ) 1,300 mg, 3 times daily   albuterol  (PROVENTIL ) 2.5 mg, Nebulization, Every 6 hours PRN   allopurinol  (ZYLOPRIM ) 100 mg, Oral, Daily   atorvastatin  (LIPITOR ) 10 MG tablet TAKE 1 TABLET(10 MG) BY MOUTH DAILY.   B-D UF III MINI PEN NEEDLES 31G X 5 MM MISC 1 Box, Subcutaneous, 4 times daily   BIKTARVY  50-200-25 MG TABS tablet TAKE 1 TABLET BY MOUTH 1 TIME A DAY   Calcium  Acetate 667-2,001 mg, See admin instructions   carvedilol  (COREG ) 3.125 MG tablet TAKE 1 TABLET(3.125 MG) BY MOUTH TWICE DAILY WITH A MEAL   diclofenac  Sodium (VOLTAREN ) 1 % GEL Apply 2  grams topically to the affected area three times daily as needed for pain.   folic acid  (FOLVITE ) 1 mg, Oral, Daily   gabapentin  (NEURONTIN ) 300 mg, Oral, 2 times daily, Patient is taking 1 tablet in the early evening after dialysis and 1 tablet at bedtime   HumaLOG  KwikPen 15 Units, Subcutaneous, 3 times daily   insulin  degludec (TRESIBA ) 15 Units, Subcutaneous, Daily, 3   isosorbide  mononitrate (IMDUR ) 30 mg, Oral, Daily   latanoprost  (XALATAN ) 0.005 % ophthalmic solution 1 drop, Daily at bedtime   leptospermum manuka honey (MEDIHONEY) PSTE paste 1 Application, Topical, Daily, Cleanse R lower leg wound with Vashe wound cleanser Soila [phone]), do not rinse and allow to air dry. Apply Medihoney to wound bed daily, cover with dry gauze and cover with silicone foam or ABD pad and Kerlix roll gauze to secure.Interchangeable with TheraHoney Apply thin layer (3 mm) to wound.   lidocaine -prilocaine  (EMLA ) cream As directed   Menthol -Camphor (TIGER BALM ARTHRITIS RUB) 11-11 % CREA 1 Application, As needed   midodrine  (PROAMATINE ) 10  mg, 3 times weekly   multivitamin (RENA-VIT) TABS tablet 1 tablet, Daily   neomycin-bacitracin-polymyxin [phone] OINT 1 Application, Apply externally, 2 times daily   nitroGLYCERIN  (NITROSTAT ) 0.3 mg, Sublingual, Every 5 min PRN   octreotide  (SANDOSTATIN  LAR DEPOT) 10 MG injection Inject 10 mg into the muscle every 28 days.   oxyCODONE -acetaminophen  (PERCOCET) 5-325 MG tablet 1 tablet, Oral, Every 8 hours PRN   polyethylene glycol (MIRALAX  / GLYCOLAX ) 17 g, Daily PRN   PREVIDENT 5000 PLUS 1.1 % CREA dental cream 1 Application, Daily at bedtime   simethicone  (MYLICON) 125 mg, As needed   timolol  (TIMOPTIC ) 0.5 % ophthalmic solution 1 drop, Every morning   umeclidinium-vilanterol (ANORO ELLIPTA ) 62.5-25 MCG/ACT AEPB 1 puff, Inhalation, Daily    Diet Orders (From admission, onward)     Start     Ordered   04/24/24 1549  Diet full liquid Room service appropriate?  Yes with Assist; Fluid consistency: Thin  Diet effective now       Question Answer Comment  Room service appropriate? Yes with Assist   Fluid consistency: Thin      04/24/24 1549            DVT prophylaxis: heparin  injection 5,000 Units Start: 04/18/24 0600   Lab Results  Component Value Date   PLT 236 04/26/2024      Code Status: Full Code  Family Communication: no family at bedside   Status is: Inpatient Remains inpatient appropriate because: severity of illness  Level of care: Telemetry Medical  Consultants:  Nephrology Vascular Cards  Objective: Vitals:   04/26/24 0021 04/26/24 0306 04/26/24 0500 04/26/24 0740  BP: (!) 114/56 113/77  91/62  Pulse: 84 77  (!) 103  Resp: 18 18  18   Temp: 98.1 F (36.7 C) 98.2 F (36.8 C)  97.8 F (36.6 C)  TempSrc: Axillary Axillary  Oral  SpO2: 95% 98%    Weight:   78.2 kg   Height:        Intake/Output Summary (Last 24 hours) at 04/26/2024 1013 Last data filed at 04/26/2024 0500 Gross per 24 hour  Intake 50 ml  Output 1051 ml  Net -1001 ml   Wt Readings from Last 3 Encounters:  04/26/24 78.2 kg  03/05/24 89.6 kg  02/22/24 89.3 kg    Examination:  Constitutional: NAD Eyes: lids and conjunctivae normal, no scleral icterus ENMT: mmm Neck: normal, supple Respiratory: clear to auscultation bilaterally, no wheezing, no crackles. Normal respiratory effort.  Cardiovascular: Regular rate and rhythm, no murmurs / rubs / gallops. No LE edema. Abdomen: soft, no distention, no tenderness. Bowel sounds positive.   Data Reviewed: I have independently reviewed following labs and imaging studies   CBC Recent Labs  Lab 04/22/24 0448 04/23/24 1237 04/24/24 0502 04/25/24 0901 04/25/24 1549 04/25/24 2334 04/26/24 0345 04/26/24 0756  WBC 9.2 9.8 9.0  --  10.6*  --  13.5*  --   HGB 12.0* 10.8* 11.3* 9.2* 7.8* 8.3* 8.5* 8.5*  HCT 37.1* 33.0* 34.9* 28.9* 23.7* 26.0* 26.6* 26.3*  PLT 148* 189 189  --  192  --  236  --    MCV 101.9* 99.7 98.9  --  101.3*  --  101.1*  --   MCH 33.0 32.6 32.0  --  33.3  --  32.3  --   MCHC 32.3 32.7 32.4  --  32.9  --  32.0  --   RDW 14.9 14.9 14.8  --  15.0  --  15.1  --  LYMPHSABS  --   --   --   --  1.5  --   --   --   MONOABS  --   --   --   --  1.1*  --   --   --   EOSABS  --   --   --   --  0.2  --   --   --   BASOSABS  --   --   --   --  0.1  --   --   --     Recent Labs  Lab 04/19/24 2229 04/20/24 0600 04/20/24 1435 04/21/24 1718 04/22/24 0805 04/23/24 1237 04/24/24 0502 04/25/24 1549 04/26/24 0345  NA  --   --    < > 136 136 130* 133* 137 133*  K  --   --    < > 5.5* 5.5* 5.6* 6.2* 5.7* 6.1*  CL  --   --    < > 92* 92* 89* 90* 100 93*  CO2  --   --    < > 26 27 25 25 23  20*  GLUCOSE  --   --    < > 188* 155* 166* 116* 150* 164*  BUN  --   --    < > 90* 117* 80* 98* 91* 55*  CREATININE  --   --    < > 10.97* 12.10* 8.40* 9.59* 8.49* 6.13*  CALCIUM   --   --    < > 9.5 9.1 9.3 9.6 8.7* 9.1  AST  --   --   --   --   --   --   --   --  80*  ALT  --   --   --   --   --   --   --   --  29  ALKPHOS  --   --   --   --   --   --   --   --  75  BILITOT  --   --   --   --   --   --   --   --  1.7*  ALBUMIN   --   --   --  2.4* 2.2*  --   --   --  2.4*  MG  --   --   --   --   --   --   --   --  2.3  LATICACIDVEN  --   --   --   --   --   --  1.5  --   --   TSH 0.656  --   --   --   --   --   --   --   --   HGBA1C  --  6.9*  --   --   --   --   --   --   --   AMMONIA 25  --   --   --   --   --   --   --   --    < > = values in this interval not displayed.    ------------------------------------------------------------------------------------------------------------------ No results for input(s): CHOL, HDL, LDLCALC, TRIG, CHOLHDL, LDLDIRECT in the last 72 hours.  Lab Results  Component Value Date   HGBA1C 6.9 (H) 04/20/2024   ------------------------------------------------------------------------------------------------------------------ No  results for input(s): TSH, T4TOTAL, T3FREE, THYROIDAB in the last 72 hours.  Invalid input(s): FREET3  Cardiac Enzymes No results for  input(s): CKMB, TROPONINI, MYOGLOBIN in the last 168 hours.  Invalid input(s): CK ------------------------------------------------------------------------------------------------------------------    Component Value Date/Time   BNP 2,378.1 (H) 01/23/2024 1931    CBG: Recent Labs  Lab 04/25/24 0623 04/25/24 1216 04/25/24 2125 04/26/24 0607 04/26/24 0739  GLUCAP 167* 118* 146* 174* 170*    Recent Results (from the past 240 hours)  Culture, blood (Routine X 2) w Reflex to ID Panel     Status: None   Collection Time: 04/19/24 10:28 PM   Specimen: BLOOD LEFT HAND  Result Value Ref Range Status   Specimen Description BLOOD LEFT HAND  Final   Special Requests   Final    BOTTLES DRAWN AEROBIC AND ANAEROBIC Blood Culture results may not be optimal due to an inadequate volume of blood received in culture bottles   Culture   Final    NO GROWTH 5 DAYS Performed at [facility], [address], [place], [name] [address]    Report Status 04/24/2024 FINAL  Final  Culture, blood (Routine X 2) w Reflex to ID Panel     Status: None   Collection Time: 04/19/24 10:28 PM   Specimen: BLOOD LEFT HAND  Result Value Ref Range Status   Specimen Description BLOOD LEFT HAND  Final   Special Requests   Final    BOTTLES DRAWN AEROBIC AND ANAEROBIC Blood Culture results may not be optimal due to an inadequate volume of blood received in culture bottles   Culture   Final    NO GROWTH 5 DAYS Performed at [facility], [address], [place], [name] [address]    Report Status 04/24/2024 FINAL  Final     Radiology Studies: No results found.    Nilda Fendt, MD, PhD [facility]  Between 7 am - 7 pm I am available, please contact me via Amion (for emergencies) or Securechat (non urgent messages)  Between 7 pm - 7 am I  am not available, please contact night coverage MD/APP via Amion

## 2024-04-27 ENCOUNTER — Inpatient Hospital Stay (HOSPITAL_COMMUNITY)

## 2024-04-27 DIAGNOSIS — Z515 Encounter for palliative care: Secondary | ICD-10-CM

## 2024-04-27 DIAGNOSIS — S81801A Unspecified open wound, right lower leg, initial encounter: Secondary | ICD-10-CM | POA: Diagnosis not present

## 2024-04-27 DIAGNOSIS — Z7189 Other specified counseling: Secondary | ICD-10-CM

## 2024-04-27 LAB — HEMOGLOBIN AND HEMATOCRIT, BLOOD
HCT: 27.5 % — ABNORMAL LOW (ref 39.0–52.0)
Hemoglobin: 9.1 g/dL — ABNORMAL LOW (ref 13.0–17.0)

## 2024-04-27 LAB — GLUCOSE, CAPILLARY
Glucose-Capillary: 147 mg/dL — ABNORMAL HIGH (ref 70–99)
Glucose-Capillary: 142 mg/dL — ABNORMAL HIGH (ref 70–99)

## 2024-04-27 MED ORDER — POLYVINYL ALCOHOL 1.4 % OP SOLN
1.0000 [drp] | Freq: Four times a day (QID) | OPHTHALMIC | Status: DC | PRN
Start: 2024-04-27 — End: 2024-04-29

## 2024-04-27 MED ORDER — HYDROMORPHONE HCL 1 MG/ML IJ SOLN: 0.5000 mg | INTRAMUSCULAR | Status: DC | PRN

## 2024-04-27 MED ORDER — ONDANSETRON 4 MG PO TBDP: 4.0000 mg | ORAL_TABLET | Freq: Four times a day (QID) | ORAL | Status: DC | PRN

## 2024-04-27 MED ORDER — LORAZEPAM 2 MG/ML IJ SOLN: 1.0000 mg | INTRAMUSCULAR | Status: DC | PRN

## 2024-04-27 MED ORDER — LORAZEPAM 1 MG PO TABS
1.0000 mg | ORAL_TABLET | ORAL | Status: DC | PRN
  Administered 2024-04-28: 1 mg via ORAL
  Filled 2024-04-27: qty 1

## 2024-04-27 MED ORDER — HALOPERIDOL 0.5 MG PO TABS
2.0000 mg | ORAL_TABLET | Freq: Four times a day (QID) | ORAL | Status: DC | PRN
  Administered 2024-04-28: 2 mg via ORAL
  Filled 2024-04-27: qty 4

## 2024-04-27 MED ORDER — GLYCOPYRROLATE 1 MG PO TABS: 1.0000 mg | ORAL_TABLET | ORAL | Status: DC | PRN

## 2024-04-27 MED ORDER — LORAZEPAM 2 MG/ML PO CONC: 1.0000 mg | ORAL | Status: DC | PRN

## 2024-04-27 MED ORDER — GLYCOPYRROLATE 0.2 MG/ML IJ SOLN: 0.2000 mg | INTRAMUSCULAR | Status: DC | PRN

## 2024-04-27 MED ORDER — BIOTENE DRY MOUTH MT LIQD
15.0000 mL | Freq: Two times a day (BID) | OROMUCOSAL | Status: DC
  Administered 2024-04-27 – 2024-04-28 (×2): 15 mL via TOPICAL

## 2024-04-27 MED ORDER — DIPHENHYDRAMINE HCL 50 MG/ML IJ SOLN: 25.0000 mg | INTRAMUSCULAR | Status: DC | PRN

## 2024-04-27 MED ORDER — HALOPERIDOL LACTATE 2 MG/ML PO CONC: 2.0000 mg | Freq: Four times a day (QID) | ORAL | Status: DC | PRN

## 2024-04-27 MED ORDER — OXYCODONE HCL 5 MG PO TABS
5.0000 mg | ORAL_TABLET | ORAL | Status: DC | PRN
  Administered 2024-04-28 (×2): 5 mg via ORAL
  Filled 2024-04-27 (×2): qty 1

## 2024-04-27 MED ORDER — ONDANSETRON HCL 4 MG/2ML IJ SOLN: 4.0000 mg | Freq: Four times a day (QID) | INTRAMUSCULAR | Status: DC | PRN

## 2024-04-27 MED ORDER — HALOPERIDOL LACTATE 5 MG/ML IJ SOLN: 2.0000 mg | INTRAMUSCULAR | Status: DC | PRN

## 2024-04-27 NOTE — Progress Notes (Signed)
 [facility] Progress Note  Subjective:  Patient not interactive.  No labs this morning.  Planning for dialysis today.  Also plan for goals of care discussion today  Vitals:   04/26/24 1932 04/26/24 2338 04/27/24 0343 04/27/24 0500  BP: 98/63 103/62 (!) 106/47   Pulse: 96 70 (!) 103   Resp: 18 18 18    Temp: 97.8 F (36.6 C) 97.7 F (36.5 C) (!) 97.4 F (36.3 C)   TempSrc:  Oral Oral   SpO2: 94% 90% 100%   Weight:    77.4 kg  Height:        Exam: Gen minimally interactive, no distress No jvd or bruits Bilateral chest rise with no increased work of breathing Normal rate, no rub Abd soft ntnd no mass or ascites +bs MS bilat feet are wrapped Minimal edema in the lower extremities at the midshin Neuro lethargic but otherwise nonfocal    RFA AVG+bruit         OP HD: [facility] MWF 3h  B400   86kg  2K  R AVG  HEparin  none Last OP HD 6/25, post wt 86.6kg Gets to dry wt, good compliance Low BP's in the 70-90s on HD       Assessment/ Plan: Ischemic wound right foot: per pmd.  He underwent angiography with angioplasty and shockwave lithotripsy on 6/26 with Dr. Gretta AMS: neurology consulting, 3 separate lacunar strokes acute/ subacute, suspected embolic. However not improving significantly.  Overall concerning.  Has had demise in the outpatient setting as well.  Goals of care conversation scheduled ESRD: on HD MWF. Ongoing GOC conversations cannot tolerate dialysis in the outpatient setting because of his altered mental status. BUN high and K high due to high catabolic state but unclear cause for this. High protein consumption could be a factor.  Off schedule this week so plan for dialysis again Saturday and then Monday BP: not on BP lowering meds, BP's stable low-normal.  Hyperkalemia: Persistent issue.  Some GI bleeding with reabsorption is possibly contributing.  Increase Lokelma  to 10 g twice daily.  Dialysis as above.  Follow-up labs from today Hypoxia/ volume:  UF with HD Anemia of esrd: Hb 11-13 here, follow.  CM EF 30-35% by echo here GOC: Have been discussed with Sister Aldona previously.  Continue conversations.  Would require a sitter at dialysis at best with his current mental status but also unclear if he would be able to tolerate dialysis in a chair.  Palliative care planning to meet with sister today. GI bleeding: Unclear cause but likely something colonic.  Unclear if he would tolerate colonoscopy well.  GI consulted.  Appreciate help  Recent Labs  Lab 04/21/24 1718 04/22/24 0448 04/22/24 0805 04/23/24 1237 04/25/24 1549 04/25/24 2334 04/26/24 0345 04/26/24 0756 04/26/24 1603  HGB  --    < >  --    < > 7.8*   < > 8.5* 8.5* 8.8*  ALBUMIN  2.4*  --  2.2*  --   --   --  2.4*  --   --   CALCIUM  9.5  --  9.1   < > 8.7*  --  9.1  --   --   PHOS 6.8*  --  7.2*  --   --   --   --   --   --   CREATININE 10.97*  --  12.10*   < > 8.49*  --  6.13*  --   --   K 5.5*  --  5.5*   < > 5.7*  --  6.1*  --   --    < > = values in this interval not displayed.   No results for input(s): IRON , TIBC, FERRITIN in the last 168 hours. Inpatient medications:  allopurinol   100 mg Oral Daily   aspirin  EC  81 mg Oral Daily   atorvastatin   40 mg Oral Daily   bictegravir-emtricitabine -tenofovir  AF  1 tablet Oral Daily   Chlorhexidine  Gluconate Cloth  6 each Topical Q0600   Chlorhexidine  Gluconate Cloth  6 each Topical Q0600   feeding supplement  237 mL Oral BID BM   heparin   5,000 Units Subcutaneous Q8H   insulin  aspart  0-5 Units Subcutaneous QHS   insulin  aspart  0-9 Units Subcutaneous TID WC   multivitamin  1 tablet Oral QHS   nutrition supplement (JUVEN)  1 packet Oral BID BM   QUEtiapine   25 mg Oral QHS   sodium chloride  flush  3 mL Intravenous Q12H   sodium chloride  flush  3 mL Intravenous Q12H   sodium zirconium cyclosilicate   10 g Oral BID   umeclidinium-vilanterol  1 puff Inhalation Daily      acetaminophen  **OR** acetaminophen ,  labetalol , ondansetron  **OR** ondansetron  (ZOFRAN ) IV, mouth rinse, oxyCODONE , sodium chloride  flush, sodium chloride  flush

## 2024-04-27 NOTE — Progress Notes (Signed)
 [facility] 6T87 [facility] Liaison Note   Received request for family interest in hospice inpatient unit. Visited patient and spoke with sister by phone on today to discuss services and hospice philosophy of care.    Chart reviewed and at this time patient does not meet criteria for hospice inpatient unit. Able to tolerate PO pain medications and is eating bites of food and sips of liquids per bedside nurse.   He is appropriate for hospice services in the home or LTC facility and we would be happy to reassess for inpatient unit appropriateness at a later time as patient declines.    Thank you for the opportunity to participate in this patient's care.    Nat Babe, BSN, RN Hospice hospital liaison [phone]

## 2024-04-27 NOTE — Consult Note (Signed)
 Palliative Care Consult Note                                  Date: 04/27/2024   Patient Name: Jacob [name]  DOB: [birth year]  MRN: [id number]  Age / Sex: 73 y.o., male  PCP: de Peru, Quintin PARAS, MD Referring Physician: Trixie Nilda HERO, MD  Reason for Consultation: Establishing goals of care  HPI/Patient Profile: 73 y.o. male  with past medical history of ESRD on HD MWF, HIV, HTN, sCHF EF 40-45%, hx apical aneurysm, DM, CAD s/p CABG 2004 last PCI 2021, hep C, hx GIB due to AVMs, still smoking, PVD of RLE, chronic RLE wound admitted on 04/17/2024 with nonhealing right medial calf wound. His hospitalization was complicated by delirium.   Past Medical History:  Diagnosis Date   Acute respiratory failure (HCC) 03/01/2018   Anemia    Arthritis    all over; mostly knees and back (02/28/2018)   Chronic combined systolic and diastolic CHF (congestive heart failure) (HCC)    Chronic lower back pain    stenosis   Community acquired pneumonia 09/06/2013   COPD (chronic obstructive pulmonary disease) (HCC)    Coronary atherosclerosis of native coronary artery    a. 02/2003 s/p CABG x 2 (VG->RI, VG->RPDA; b. 11/2019 PCI: LM nl, LAD 90d, D3 50, RI 100, LCX 100p, OM3 100 - fills via L->L collats from D2/dLAD, RCA 100p, VG->RPDA ok, VG->RI 95 (3.5x48 Synergy XD DES).   Drug abuse (HCC)    hx; tested for cocaine as recently as 2/08. says he is not using drugs now - avoided defib. for this reason    ESRD (end stage renal disease) (HCC)    [facility] M-W-F- [facility]   Fall at home 10/2020   GERD (gastroesophageal reflux disease)    takes OTC meds as needed   GI bleeding    a. 11/2019 EGD: angiodysplastic lesions w/ bleeding s/p argon plasma/clipping/epi inj. Multiple admissions for the same.   Glaucoma    uses eye drops daily   Hepatitis B 1968   tx'd w/isolation; caught it from toilet stools in gym   History of blood transfusion 03/01/2019   History of  colon polyps    benign   History of gout    takes Allopurinol  daily as well as Colchicine -if needed (02/28/2018)   History of kidney stones    HTN (hypertension)    takes Coreg ,Imdur .and Apresoline  daily   Human immunodeficiency virus (HIV) disease (HCC) dx'd 1995   on Biktarvy  as of 12/2020.     Hyperlipidemia    Ischemic cardiomyopathy    a. 01/2019 Echo: EF 40-45%, diffuse HK, mild basal septal hypertrophy. Diast dysfxn. Nl RV size/fxn. Sev dil LA. Triv MR/TR/PR.   Muscle spasm    takes Zanaflex  as needed   Myocardial infarction [facility]) ~ 2004/2005   Nocturia    Peripheral neuropathy    takes gabapentin  daily   Pneumonia    at least twice (02/28/2018)   SDH (subdural hematoma) (HCC)    Syphilis, unspecified    Type II diabetes mellitus (HCC) 2004   Lantus  daily.Average fasting blood sugar  125-199   Wears glasses    Wears partial dentures     Subjective:   I have reviewed medical records including EPIC notes, labs and imaging, received update from attending provider, assessed the patient and then met with the patient's sister Rajveer Handler and niece Jon Castle  to discuss diagnosis prognosis, GOC, EOL wishes, disposition and options.  I introduced Palliative Medicine as specialized medical care for people living with serious illness. It focuses on providing relief from symptoms and stress of a serious illness. The goal is to improve quality of life for both the patient and the family.  Today's Discussion: Patient sitting up in bed he looks tired but in NAD. Family at bedside attempting to talk to patient who looks at them but does not verbally respond.  Went to conference room with patient's sister Lolita and niece Jon to discuss goals of care. They have a good understanding of the patient's chronic conditions and acute hospitalization. They shared that the patient has had a steep decline in function since his last hospitalization. This admission he has had increasing  delirium on top of his mild dementia.  The combination of his decline in function and altered mental status has made it difficult for him to tolerate dialysis.  Prior to this admission the patient was living independently. He completed all ADLs and IADLs himself. He managed his own health care. He never married and did not have children. He was one of 7 siblings (5 are still living). 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 He has been on HD for several years and tolerated it well. He was always in frequent contact with his family and would always be the person checking on everybody else. Over the last months he had become more distant and checked in less frequently. His family suspects he was feeling worse and keeping it from family.  A discussion was had today regarding advanced directives. We discussed code status. Recommended consideration of DNR status, understanding evidenced-based poor outcomes in similar hospitalized patients, as the cause of the arrest is likely associated with chronic/terminal disease rather than a reversible acute cardio-pulmonary event. Family does not want an attempted resuscitation- Code status changed to DNR. We discussed the difference between an aggressive medical intervention path and a  comfort focussed path for this patient. The patient recently told a sibling that he was getting tired. They do not believe the patient would want to continue aggressive measures with his current quality of life.  Decision made to transition to comfort measures. We discussed that the patient would no longer receive aggressive medical interventions such as dialysis, frequent vital signs, lab work, radiology testing, or medications not focused on comfort. All care would focus on how the patient is looking and feeling. This would include management of any symptoms that may cause discomfort, pain, shortness of breath, cough, nausea, agitation, anxiety, and/or secretions etc. Symptoms  would be managed with medications and other non-pharmacological interventions. Family verbalized understanding and appreciation. We discussed potential locations for end of life care. Family would like the patient to be assessed for inpatient hospice at [facility]. TOC order placed.  Patient's sister and niece share that the entire family is on board with the decision to transition to comfort focussed measures. Questions and concerns were addressed. Hard Choices booklet left for review. The family was encouraged to call with questions or concerns. PMT will continue to support holistically.  Review of Systems  Unable to perform ROS  Objective:   Primary Diagnoses: Present on Admission:  Ischemic leg wound  Chronic obstructive pulmonary disease (HCC)  Anemia of chronic disease  Human immunodeficiency virus (HIV) disease (HCC)  Coronary artery disease with angina  Essential hypertension  ESRD (end stage renal disease) (HCC)   Physical Exam Vitals reviewed.  Constitutional:      General: He is awake. He is not in acute distress.    Appearance: He is ill-appearing.  HENT:     Head: Normocephalic and atraumatic.  Cardiovascular:     Rate and Rhythm: Normal rate.  Pulmonary:     Effort: Pulmonary effort is normal.  Skin:    General: Skin is warm and dry.     Vital Signs:  BP 100/60 (BP Location: Left Arm)   Pulse 89   Temp 98.3 F (36.8 C)   Resp 16   Ht 6' 2 (1.88 m)   Wt 77.4 kg   SpO2 100%   BMI 21.91 kg/m   Palliative Assessment/Data: 30 % at most    Advanced Care Planning:   Existing Vynca/ACP Documentation: None  Primary Decision Maker: Next of kin. Sister Trevaris Pennella  Code Status/Advance Care Planning: DNR   Assessment & Plan:   SUMMARY OF RECOMMENDATIONS   Changed to DNR/DNI Full comfort measures Symptom management per [facility] No further dialysis TOC order placed for inpatient hospice- [facility] PMT will continue to support  Symptom  Management:  Oxycodone /Dilaudid  PRN for pain/air hunger/comfort Robinul  PRN for excessive secretions Ativan  PRN for agitation/anxiety Zofran  PRN for nausea Liquifilm tears PRN for dry eyes Haldol  PRN for agitation/anxiety May have comfort feeding Comfort cart for family Unrestricted visitations in the setting of EOL (per policy) Oxygen PRN 2L or less for comfort. No escalation.     Discussed with: Dr. Trixie, bedside RN, [facility] liaison Nat Babe, Josie Barnes LCSW  Time Total: 90 minutes    Thank you for allowing us  to participate in the care of ADONI GREENOUGH PMT will continue to support holistically.  Signed by: Stephane Palin, NP Palliative Medicine Team  Team Phone # [phone] (Nights/Weekends)  04/27/2024, 1:25 PM

## 2024-04-27 NOTE — Progress Notes (Signed)
 PROGRESS NOTE  Jacob [name] FMW:[id number] DOB: [birth year] DOA: 04/17/2024 PCP: de Peru, Raymond J, MD   LOS: 10 days   Brief Narrative / Interim history: 73 y.o. M with ESRD on HD MWF, HIV, HTN, sCHF EF 40-45%, hx apical aneurysm, DM, CAD s/p CABG 2004 last PCI 2021, hep C, hx GIB due to AVMs, still smoking, PVD of RLE, chronic RLE wound who presented with nonhealing right medial calf wound.  Hospitalization complicated by delirium.  Subjective / 24h Interval events: He is waking up, answering basic questions but does not engage in conversation  Assesement and Plan: Principal Problem:   Ischemic leg wound Active Problems:   ESRD (end stage renal disease) (HCC)   Stroke (HCC)   Human immunodeficiency virus (HIV) disease (HCC)   Essential hypertension   Coronary artery disease with angina   DM (diabetes mellitus) (HCC)   Chronic systolic CHF (congestive heart failure) (HCC)   Anemia of chronic disease   Chronic obstructive pulmonary disease (HCC)   Acute metabolic encephalopathy   History of hepatitis C   History of GI bleeding due to AVMs   Malnutrition of moderate degree   Rectal bleeding   Principal problem Ischemic leg wound, Peripheral vascular disease - This was the initial presenting complaint.  Actually he was told to come to the ER by [facility] due to missed appointments with Cardiology. He had normal ABI in Feb, then developed a wound in March.  This progressed so he re-established with the [facility] in April.  They treated it for a month, repeated ABI that now showed high grade stenosis in the RLE.   - Cardiology and vascular surgery consulted on admission, underwent angiography and angioplasty with shockwave lithotripsy on 6/26 with Dr. Gretta -Now optimized, continue aspirin  and Lipitor .  Plavix  had to be on hold since 7/2 with new onset GI bleed.  Hemoglobin now stable, GI bleed has not recurred and will attempt to resume Plavix  today but before I  will repeat the CT scan of the head given slightly worsening mental status in the last couple of days   Active problems  New onset lower GI bleed -7/2, after he finishes dialysis session.  Patient experienced bright red blood per rectum, large-volume.  Has not had an episode since.  Gastroenterology consulted, however he has refused to engage with the GI team, refused DRE and GI will watch from afar.  Hemoglobin stable off Plavix .  May resume as above  Acute metabolic encephalopathy, Uremia - Encephalopathy has become the main issue. At baseline, patient with possible dementia, but no diagnosed memory loss and living independently, managing own medicines and dialysis. Here, he was very disoriented on admission, and had waxing and waning confusion/somnolence since.   TSH, ammonia, B12, LFTs, RPR all normal.  CK normal.  No prior COPD.  Obtained MRI that showed strokes, and suspect at this time that his delirium is from stroke superimposed on undiagnosed mild dementia. - Standard delirium precautions: blinds open and lights on during day, TV off, minimize interruptions at night, glasses/hearing aids, PT/OT, avoiding Beers list medications     Stroke [facility]) - MRI brain showed scattered left MCA punctate infarcts. MRA showed no high grade intracranial stenoses. Carotid US  showed no stenoses. Echocardiogram showed reduced EF but no cardiogenic source of embolism. Continue DAPT. Continue Lipitor . Neurology f/u after discharge -Needs SNF, 30-day event monitor at discharge   Hyperkalemia-in the setting of renal disease, Lokelma  per nephrology  ESRD -  Consult nephrology for HD, will have a session today   Human immunodeficiency virus (HIV) disease (HCC) - HIV quant last Jan undetectable. Continue Biktarvy   History of GI bleeding due to AVMs -new GI bleed as above   History of hepatitis C - LFTs normal   Chronic obstructive pulmonary disease (HCC) - No evidence of flare, no wheezing. Continue Anoro    Anemia of chronic disease - Hgb stable, no bleeding observed   Chronic systolic CHF (congestive heart failure) (HCC) LV aneurysm resolved. - EF down from 40% to 30-35% here. Hold carvedilol  given soft BP. Volume status per HD   DM (diabetes mellitus) (HCC) - Glucose controlled.  Continue sliding scale   Coronary artery disease with angina Hypertension - Continue Lipitor  (had been stopped outpatient due to muscle aches) - Hold carvedilol  and Imdur  due to soft BP    Scheduled Meds:  allopurinol   100 mg Oral Daily   aspirin  EC  81 mg Oral Daily   atorvastatin   40 mg Oral Daily   bictegravir-emtricitabine -tenofovir  AF  1 tablet Oral Daily   Chlorhexidine  Gluconate Cloth  6 each Topical Q0600   Chlorhexidine  Gluconate Cloth  6 each Topical Q0600   feeding supplement  237 mL Oral BID BM   heparin   5,000 Units Subcutaneous Q8H   insulin  aspart  0-5 Units Subcutaneous QHS   insulin  aspart  0-9 Units Subcutaneous TID WC   multivitamin  1 tablet Oral QHS   nutrition supplement (JUVEN)  1 packet Oral BID BM   QUEtiapine   25 mg Oral QHS   sodium chloride  flush  3 mL Intravenous Q12H   sodium chloride  flush  3 mL Intravenous Q12H   sodium zirconium cyclosilicate   10 g Oral BID   umeclidinium-vilanterol  1 puff Inhalation Daily   Continuous Infusions: PRN Meds:.acetaminophen  **OR** acetaminophen , labetalol , ondansetron  **OR** ondansetron  (ZOFRAN ) IV, mouth rinse, oxyCODONE , sodium chloride  flush, sodium chloride  flush  Current Outpatient Medications  Medication Instructions   acetaminophen  (TYLENOL ) 1,300 mg, 3 times daily   albuterol  (PROVENTIL ) 2.5 mg, Nebulization, Every 6 hours PRN   allopurinol  (ZYLOPRIM ) 100 mg, Oral, Daily   atorvastatin  (LIPITOR ) 10 MG tablet TAKE 1 TABLET(10 MG) BY MOUTH DAILY.   B-D UF III MINI PEN NEEDLES 31G X 5 MM MISC 1 Box, Subcutaneous, 4 times daily   BIKTARVY  50-200-25 MG TABS tablet TAKE 1 TABLET BY MOUTH 1 TIME A DAY   Calcium  Acetate 667-2,001 mg,  See admin instructions   carvedilol  (COREG ) 3.125 MG tablet TAKE 1 TABLET(3.125 MG) BY MOUTH TWICE DAILY WITH A MEAL   diclofenac  Sodium (VOLTAREN ) 1 % GEL Apply 2 grams topically to the affected area three times daily as needed for pain.   folic acid  (FOLVITE ) 1 mg, Oral, Daily   gabapentin  (NEURONTIN ) 300 mg, Oral, 2 times daily, Patient is taking 1 tablet in the early evening after dialysis and 1 tablet at bedtime   HumaLOG  KwikPen 15 Units, Subcutaneous, 3 times daily   insulin  degludec (TRESIBA ) 15 Units, Subcutaneous, Daily, 3   isosorbide  mononitrate (IMDUR ) 30 mg, Oral, Daily   latanoprost  (XALATAN ) 0.005 % ophthalmic solution 1 drop, Daily at bedtime   leptospermum manuka honey (MEDIHONEY) PSTE paste 1 Application, Topical, Daily, Cleanse R lower leg wound with Vashe wound cleanser Soila [phone]), do not rinse and allow to air dry. Apply Medihoney to wound bed daily, cover with dry gauze and cover with silicone foam or ABD pad and Kerlix roll gauze to secure.Interchangeable with TheraHoney Apply thin  layer (3 mm) to wound.   lidocaine -prilocaine  (EMLA ) cream As directed   Menthol -Camphor (TIGER BALM ARTHRITIS RUB) 11-11 % CREA 1 Application, As needed   midodrine  (PROAMATINE ) 10 mg, 3 times weekly   multivitamin (RENA-VIT) TABS tablet 1 tablet, Daily   neomycin-bacitracin-polymyxin [phone] OINT 1 Application, Apply externally, 2 times daily   nitroGLYCERIN  (NITROSTAT ) 0.3 mg, Sublingual, Every 5 min PRN   octreotide  (SANDOSTATIN  LAR DEPOT) 10 MG injection Inject 10 mg into the muscle every 28 days.   oxyCODONE -acetaminophen  (PERCOCET) 5-325 MG tablet 1 tablet, Oral, Every 8 hours PRN   polyethylene glycol (MIRALAX  / GLYCOLAX ) 17 g, Daily PRN   PREVIDENT 5000 PLUS 1.1 % CREA dental cream 1 Application, Daily at bedtime   simethicone  (MYLICON) 125 mg, As needed   timolol  (TIMOPTIC ) 0.5 % ophthalmic solution 1 drop, Every morning   umeclidinium-vilanterol (ANORO ELLIPTA ) 62.5-25  MCG/ACT AEPB 1 puff, Inhalation, Daily    Diet Orders (From admission, onward)     Start     Ordered   04/24/24 1549  Diet full liquid Room service appropriate? Yes with Assist; Fluid consistency: Thin  Diet effective now       Question Answer Comment  Room service appropriate? Yes with Assist   Fluid consistency: Thin      04/24/24 1549            DVT prophylaxis: heparin  injection 5,000 Units Start: 04/18/24 0600   Lab Results  Component Value Date   PLT 236 04/26/2024      Code Status: Full Code  Family Communication: no family at bedside   Status is: Inpatient Remains inpatient appropriate because: severity of illness  Level of care: Telemetry Medical  Consultants:  Nephrology Vascular Cards  Objective: Vitals:   04/26/24 1932 04/26/24 2338 04/27/24 0343 04/27/24 0500  BP: 98/63 103/62 (!) 106/47   Pulse: 96 70 (!) 103   Resp: 18 18 18    Temp: 97.8 F (36.6 C) 97.7 F (36.5 C) (!) 97.4 F (36.3 C)   TempSrc:  Oral Oral   SpO2: 94% 90% 100%   Weight:    77.4 kg  Height:        Intake/Output Summary (Last 24 hours) at 04/27/2024 1044 Last data filed at 04/26/2024 2000 Gross per 24 hour  Intake 0 ml  Output --  Net 0 ml   Wt Readings from Last 3 Encounters:  04/27/24 77.4 kg  03/05/24 89.6 kg  02/22/24 89.3 kg    Examination:  Constitutional: NAD Eyes: lids and conjunctivae normal, no scleral icterus ENMT: mmm Neck: normal, supple Respiratory: clear to auscultation bilaterally, no wheezing, no crackles. Normal respiratory effort.  Cardiovascular: Regular rate and rhythm, no murmurs / rubs / gallops. No LE edema. Abdomen: soft, no distention, no tenderness. Bowel sounds positive.   Data Reviewed: I have independently reviewed following labs and imaging studies   CBC Recent Labs  Lab 04/22/24 0448 04/23/24 1237 04/24/24 0502 04/25/24 0901 04/25/24 1549 04/25/24 2334 04/26/24 0345 04/26/24 0756 04/26/24 1603  WBC 9.2 9.8 9.0  --   10.6*  --  13.5*  --   --   HGB 12.0* 10.8* 11.3*   < > 7.8* 8.3* 8.5* 8.5* 8.8*  HCT 37.1* 33.0* 34.9*   < > 23.7* 26.0* 26.6* 26.3* 26.5*  PLT 148* 189 189  --  192  --  236  --   --   MCV 101.9* 99.7 98.9  --  101.3*  --  101.1*  --   --  MCH 33.0 32.6 32.0  --  33.3  --  32.3  --   --   MCHC 32.3 32.7 32.4  --  32.9  --  32.0  --   --   RDW 14.9 14.9 14.8  --  15.0  --  15.1  --   --   LYMPHSABS  --   --   --   --  1.5  --   --   --   --   MONOABS  --   --   --   --  1.1*  --   --   --   --   EOSABS  --   --   --   --  0.2  --   --   --   --   BASOSABS  --   --   --   --  0.1  --   --   --   --    < > = values in this interval not displayed.    Recent Labs  Lab 04/21/24 1718 04/22/24 0805 04/23/24 1237 04/24/24 0502 04/25/24 1549 04/26/24 0345  NA 136 136 130* 133* 137 133*  K 5.5* 5.5* 5.6* 6.2* 5.7* 6.1*  CL 92* 92* 89* 90* 100 93*  CO2 26 27 25 25 23  20*  GLUCOSE 188* 155* 166* 116* 150* 164*  BUN 90* 117* 80* 98* 91* 55*  CREATININE 10.97* 12.10* 8.40* 9.59* 8.49* 6.13*  CALCIUM  9.5 9.1 9.3 9.6 8.7* 9.1  AST  --   --   --   --   --  80*  ALT  --   --   --   --   --  29  ALKPHOS  --   --   --   --   --  75  BILITOT  --   --   --   --   --  1.7*  ALBUMIN  2.4* 2.2*  --   --   --  2.4*  MG  --   --   --   --   --  2.3  LATICACIDVEN  --   --   --  1.5  --   --     ------------------------------------------------------------------------------------------------------------------ No results for input(s): CHOL, HDL, LDLCALC, TRIG, CHOLHDL, LDLDIRECT in the last 72 hours.  Lab Results  Component Value Date   HGBA1C 6.9 (H) 04/20/2024   ------------------------------------------------------------------------------------------------------------------ No results for input(s): TSH, T4TOTAL, T3FREE, THYROIDAB in the last 72 hours.  Invalid input(s): FREET3  Cardiac Enzymes No results for input(s): CKMB, TROPONINI, MYOGLOBIN in the last 168  hours.  Invalid input(s): CK ------------------------------------------------------------------------------------------------------------------    Component Value Date/Time   BNP 2,378.1 (H) 01/23/2024 1931    CBG: Recent Labs  Lab 04/26/24 0739 04/26/24 1150 04/26/24 1603 04/26/24 2207 04/27/24 0629  GLUCAP 170* 152* 121* 127* 147*    Recent Results (from the past 240 hours)  Culture, blood (Routine X 2) w Reflex to ID Panel     Status: None   Collection Time: 04/19/24 10:28 PM   Specimen: BLOOD LEFT HAND  Result Value Ref Range Status   Specimen Description BLOOD LEFT HAND  Final   Special Requests   Final    BOTTLES DRAWN AEROBIC AND ANAEROBIC Blood Culture results may not be optimal due to an inadequate volume of blood received in culture bottles   Culture   Final    NO GROWTH 5 DAYS Performed at [facility], [address] [place], [name] [address]    Report Status 04/24/2024 FINAL  Final  Culture, blood (Routine X 2) w Reflex to ID Panel     Status: None   Collection Time: 04/19/24 10:28 PM   Specimen: BLOOD LEFT HAND  Result Value Ref Range Status   Specimen Description BLOOD LEFT HAND  Final   Special Requests   Final    BOTTLES DRAWN AEROBIC AND ANAEROBIC Blood Culture results may not be optimal due to an inadequate volume of blood received in culture bottles   Culture   Final    NO GROWTH 5 DAYS Performed at [facility], [address], [place], [name] [address]    Report Status 04/24/2024 FINAL  Final     Radiology Studies: No results found.    Nilda Fendt, MD, PhD [facility]  Between 7 am - 7 pm I am available, please contact me via Amion (for emergencies) or Securechat (non urgent messages)  Between 7 pm - 7 am I am not available, please contact night coverage MD/APP via Amion

## 2024-04-28 DIAGNOSIS — S81801A Unspecified open wound, right lower leg, initial encounter: Secondary | ICD-10-CM | POA: Diagnosis not present

## 2024-04-28 MED ORDER — HYDROMORPHONE HCL 1 MG/ML IJ SOLN
0.5000 mg | INTRAMUSCULAR | Status: DC | PRN
  Administered 2024-04-28: 1 mg via INTRAVENOUS
  Filled 2024-04-28: qty 1

## 2024-04-28 NOTE — Discharge Summary (Signed)
 Physician Discharge Summary  JO CERONE FMW:[id number] DOB: [birth year] DOA: 04/17/2024  PCP: de [place], Raymond J, MD  Admit date: 04/17/2024 Discharge date: 04/28/2024  Admitted From: home Disposition:  residential hospice  Discharge Condition: stable CODE STATUS: DNR Diet Orders (From admission, onward)     Start     Ordered   04/27/24 1320  Diet regular Room service appropriate? Yes; Fluid consistency: Thin  Diet effective now       Comments: May eat/drink as desired for end of life- careful hand feed  Question Answer Comment  Room service appropriate? Yes   Fluid consistency: Thin      04/27/24 1323           Brief Narrative / Interim history: 73 y.o. M with ESRD on HD MWF, HIV, HTN, sCHF EF 40-45%, hx apical aneurysm, DM, CAD s/p CABG 2004 last PCI 2021, hep C, hx GIB due to AVMs, still smoking, PVD of RLE, chronic RLE wound who presented with nonhealing right medial calf wound.  Hospitalization complicated by delirium.  Hospital Course / Discharge diagnoses: Principal Problem:   Ischemic leg wound Active Problems:   ESRD (end stage renal disease) (HCC)   Stroke (HCC)   Human immunodeficiency virus (HIV) disease (HCC)   Essential hypertension   Coronary artery disease with angina   DM (diabetes mellitus) (HCC)   Chronic systolic CHF (congestive heart failure) (HCC)   Anemia of chronic disease   Chronic obstructive pulmonary disease (HCC)   Acute metabolic encephalopathy   History of hepatitis C   History of GI bleeding due to AVMs   Malnutrition of moderate degree   Rectal bleeding   Principal problem GOC -patient was admitted to the hospital with ischemic wound s/p vascular intervention, new CVA, and also 1 episode of GI bleed.  Hospital course complicated by ongoing encephalopathy, progressive decline despite treatment.  Goals of care were held and he was transitioned to comfort  Active problems  Ischemic leg wound, Peripheral vascular disease - This was  the initial presenting complaint.  Actually he was told to come to the ER by his [facility] due to missed appointments with Cardiology. He had normal ABI in Feb, then developed a wound in March.  This progressed so he re-established with the [facility] in April.  They treated it for a month, repeated ABI that now showed high grade stenosis in the RLE.   - Cardiology and vascular surgery consulted on admission, underwent angiography and angioplasty with shockwave lithotripsy on 6/26 with Dr. Gretta New onset lower GI bleed -7/2, after he finishes dialysis session.  Patient experienced bright red blood per rectum, large-volume.  Has not had an episode since.  Gastroenterology consulted, however he has refused to engage with the GI team, refused DRE Acute metabolic encephalopathy, Uremia - At baseline, patient with possible dementia, but no diagnosed memory loss and living independently, managing own medicines and dialysis. Here, he was very disoriented on admission, and had waxing and waning confusion/somnolence since.   TSH, ammonia, B12, LFTs, RPR all normal.  CK normal.  No prior COPD.  Obtained MRI that showed strokes, and suspect at this time that his delirium is from stroke superimposed on undiagnosed mild dementia. Stroke [facility]) - MRI brain showed scattered left MCA punctate infarcts. MRA showed no high grade intracranial stenoses. Carotid US  showed no stenoses. Echocardiogram showed reduced EF but no cardiogenic source of embolism.  Hyperkalemia  ESRD  Human immunodeficiency virus (HIV) disease (HCC)  History of GI bleeding due to AVMs  History of hepatitis C  Chronic obstructive pulmonary disease (HCC)  Anemia of chronic disease  Chronic systolic CHF (congestive heart failure) (HCC) LV aneurysm resolved. - EF down from 40% to 30-35%  DM (diabetes mellitus) (HCC)  Coronary artery disease with angina Hypertension  Sepsis ruled out   Discharge Instructions   Allergies as of  04/28/2024       Reactions   Augmentin  [amoxicillin -pot Clavulanate] Diarrhea, Other (See Comments)   Severe diarrhea   Morphine  Sulfate Anaphylaxis   Can't mix with oxycodone     Mucinex  Fast-max Other (See Comments)   Intense sweating    Amphetamines Other (See Comments)   Unknown reaction        Medication List     STOP taking these medications    acetaminophen  650 MG CR tablet Commonly known as: TYLENOL    allopurinol  100 MG tablet Commonly known as: ZYLOPRIM    Anoro Ellipta  62.5-25 MCG/ACT Aepb Generic drug: umeclidinium-vilanterol   atorvastatin  10 MG tablet Commonly known as: LIPITOR    B-D UF III MINI PEN NEEDLES 31G X 5 MM Misc Generic drug: Insulin  Pen Needle   Biktarvy  50-200-25 MG Tabs tablet Generic drug: bictegravir-emtricitabine -tenofovir  AF   Calcium  Acetate 667 MG Tabs   carvedilol  3.125 MG tablet Commonly known as: COREG    diclofenac  Sodium 1 % Gel Commonly known as: VOLTAREN    folic acid  1 MG tablet Commonly known as: FOLVITE    gabapentin  300 MG capsule Commonly known as: NEURONTIN    HumaLOG  KwikPen 100 UNIT/ML KwikPen Generic drug: insulin  lispro   insulin  degludec 100 UNIT/ML FlexTouch Pen Commonly known as: TRESIBA    isosorbide  mononitrate 30 MG 24 hr tablet Commonly known as: IMDUR    latanoprost  0.005 % ophthalmic solution Commonly known as: XALATAN    leptospermum manuka honey Pste paste   lidocaine -prilocaine  cream Commonly known as: EMLA    midodrine  10 MG tablet Commonly known as: PROAMATINE    multivitamin Tabs tablet   neomycin-bacitracin-polymyxin [phone] Oint   nitroGLYCERIN  0.3 MG SL tablet Commonly known as: NITROSTAT    polyethylene glycol 17 g packet Commonly known as: MIRALAX  / GLYCOLAX    PreviDent 5000 Plus 1.1 % Crea dental cream Generic drug: sodium fluoride   SandoSTATIN  LAR [place] 10 MG injection Generic drug: octreotide    simethicone  125 MG chewable tablet Commonly known as: MYLICON   Tiger  Balm Arthritis Rub 11-11 % Crea Generic drug: Menthol -Camphor   timolol  0.5 % ophthalmic solution Commonly known as: TIMOPTIC        TAKE these medications    albuterol  (2.5 MG/3ML) 0.083% nebulizer solution Commonly known as: PROVENTIL  Take 3 mLs (2.5 mg total) by nebulization every 6 (six) hours as needed for wheezing or shortness of breath.   oxyCODONE -acetaminophen  5-325 MG tablet Commonly known as: Percocet Take 1 tablet by mouth every 8 (eight) hours as needed for severe pain (pain score 7-10).        Follow-up Information     [facility] Follow up in 1 month(s).   Specialty: Vascular Surgery Why: The office will call you with your appointment Contact information: [address] [place]  [address] [phone]               Consultations: Nephrology Neurology Palliative Cardiology  Vascular surgery   Procedures/Studies:  CT HEAD WO CONTRAST ( ) Result Date: 04/27/2024 CLINICAL DATA:  73 year old male with altered mental status, embolic appearing infarcts  on MRI last month. EXAM: CT HEAD WITHOUT CONTRAST TECHNIQUE: Contiguous axial images were obtained from the base of the skull through the vertex without intravenous contrast. RADIATION DOSE REDUCTION: This exam was performed according to the departmental dose-optimization program which includes automated exposure control, adjustment of the mA and/or kV according to patient size and/or use of iterative reconstruction technique. COMPARISON:  Brain MRI 04/19/2024.  Head CT 12/27/2023. FINDINGS: Brain: Stable cerebral volume since March. No midline shift, ventriculomegaly, mass effect, evidence of mass lesion, intracranial hemorrhage or evidence of cortically based acute infarction. Subcentimeter infarcts on MRI last month occult by CT. Maintained gray-white differentiation. Vascular: Calcified atherosclerosis at the skull base. No  suspicious intracranial vascular hyperdensity. Skull: Nondisplaced anterior C1 ring fracture is visible on series 4, image 37, unchanged from prior head CT 11/06/2020. No acute osseous abnormality identified. Sinuses/Orbits: Visualized paranasal sinuses and mastoids are stable and well aerated. Other: Calcified scalp vessel atherosclerosis. No acute orbit or scalp soft tissue finding. IMPRESSION: 1. No acute intracranial abnormality. 2. Subcentimeter infarcts on MRI last month occult by CT. 3. Advanced calcified atherosclerosis. 4. Chronic nondisplaced C1 ring fracture, stable since 2022. Electronically Signed   By: VEAR Hurst M.D.   On: 04/27/2024 12:46   VAS US  CAROTID Result Date: 04/25/2024 Carotid Arterial Duplex Study Patient Name:  SCHNEIDER WARCHOL  Date of Exam:   04/23/2024 Medical Rec #: [id number]        Accession #:    [id number] Date of Birth: [birth year]         Patient Gender: M Patient Age:   73 years Exam Location:  [facility] Procedure:      VAS US  CAROTID Referring Phys: DEVON SHAFER --------------------------------------------------------------------------------  Indications:       CVA. Risk Factors:      Hypertension, hyperlipidemia, Diabetes. Limitations        Today's exam was limited due to the patient's inability or                    unwillingness to cooperate, the patient's respiratory                    variation and patient constant movement. Comparison Study:  No prior studies. Performing Technologist: Cordella Collet RVT  Examination Guidelines: A complete evaluation includes B-mode imaging, spectral Doppler, color Doppler, and power Doppler as needed of all accessible portions of each vessel. Bilateral testing is considered an integral part of a complete examination. Limited examinations for reoccurring indications may be performed as noted.  Right Carotid Findings: +----------+--------+--------+--------+-----------------------+--------+           PSV cm/sEDV cm/sStenosisPlaque  Description     Comments +----------+--------+--------+--------+-----------------------+--------+ CCA Prox  77      11              smooth and heterogenous         +----------+--------+--------+--------+-----------------------+--------+ CCA Distal106     11              calcific                        +----------+--------+--------+--------+-----------------------+--------+ ICA Prox  90      13              calcific                        +----------+--------+--------+--------+-----------------------+--------+ ICA Mid   68  15                                              +----------+--------+--------+--------+-----------------------+--------+ ICA Distal47      8                                      tortuous +----------+--------+--------+--------+-----------------------+--------+ ECA       140     9                                               +----------+--------+--------+--------+-----------------------+--------+ +----------+--------+-------+--------+-------------------+           PSV cm/sEDV cmsDescribeArm Pressure (mmHG) +----------+--------+-------+--------+-------------------+ [id number]                                        +----------+--------+-------+--------+-------------------+ +---------+--------+--+--------+-+---------+ VertebralPSV cm/s24EDV cm/s5Antegrade +---------+--------+--+--------+-+---------+  Left Carotid Findings: +----------+--------+--------+--------+-----------------------+--------+           PSV cm/sEDV cm/sStenosisPlaque Description     Comments +----------+--------+--------+--------+-----------------------+--------+ CCA Prox  89      11              heterogenous and smoothtortuous +----------+--------+--------+--------+-----------------------+--------+ CCA Distal140     16              calcific                        +----------+--------+--------+--------+-----------------------+--------+ ICA  Prox  101     16              calcific                        +----------+--------+--------+--------+-----------------------+--------+ ICA Mid   70      10                                     tortuous +----------+--------+--------+--------+-----------------------+--------+ ICA Distal59      11                                     tortuous +----------+--------+--------+--------+-----------------------+--------+ ECA       181     8                                               +----------+--------+--------+--------+-----------------------+--------+ +----------+--------+--------+--------+-------------------+           PSV cm/sEDV cm/sDescribeArm Pressure (mmHG) +----------+--------+--------+--------+-------------------+ [id number]                                         +----------+--------+--------+--------+-------------------+ +---------+--------+--+--------+--+---------+ VertebralPSV cm/s41EDV cm/s10Antegrade +---------+--------+--+--------+--+---------+   Summary: Right Carotid: Velocities in the right ICA are consistent with a 1-39% stenosis. Left Carotid: Velocities  in the left ICA are consistent with a 1-39% stenosis. Vertebrals: Bilateral vertebral arteries demonstrate antegrade flow. *See table(s) above for measurements and observations.  Electronically signed by Eather Popp MD on 04/25/2024 at 1:56:09 PM.    Final    MR ANGIO NECK WO CONTRAST Result Date: 04/20/2024 CLINICAL DATA:  Stroke/TIA, determine embolic source EXAM: MRA NECK WITHOUT CONTRAST MRA HEAD WITHOUT CONTRAST TECHNIQUE: Angiographic images of the Circle of Willis were acquired using MRA technique without intravenous contrast. COMPARISON:  MRI head June 27, 25. FINDINGS: MRA NECK FINDINGS Severely motion limited MRA neck. The common carotid and internal carotid arteries appear patent with nondiagnostic evaluation for stenosis. Essentially nondiagnostic evaluation of the vertebral arteries. MRA  HEAD FINDINGS Moderately motion limited study.  Within this limitation: Anterior circulation: Bilateral intracranial ICAs, MCAs, and ACAs are patent without proximal high-grade stenosis. Posterior circulation: Bilateral intradural vertebral arteries, basilar artery and bilateral posterior orders are pale without proximal high-grade stenosis. IMPRESSION: MRA head: Moderately motion limited without evidence of large vessel occlusion or proximal high-grade stenosis. MRA neck: Severely motion limited with patent carotids and nondiagnostic evaluation of the vertebral arteries. A CTA of the neck could better assess. Electronically Signed   By: Gilmore GORMAN Molt M.D.   On: 04/20/2024 22:00   MR ANGIO HEAD WO CONTRAST Result Date: 04/20/2024 CLINICAL DATA:  Stroke/TIA, determine embolic source EXAM: MRA NECK WITHOUT CONTRAST MRA HEAD WITHOUT CONTRAST TECHNIQUE: Angiographic images of the Circle of Willis were acquired using MRA technique without intravenous contrast. COMPARISON:  MRI head June 27, 25. FINDINGS: MRA NECK FINDINGS Severely motion limited MRA neck. The common carotid and internal carotid arteries appear patent with nondiagnostic evaluation for stenosis. Essentially nondiagnostic evaluation of the vertebral arteries. MRA HEAD FINDINGS Moderately motion limited study.  Within this limitation: Anterior circulation: Bilateral intracranial ICAs, MCAs, and ACAs are patent without proximal high-grade stenosis. Posterior circulation: Bilateral intradural vertebral arteries, basilar artery and bilateral posterior orders are pale without proximal high-grade stenosis. IMPRESSION: MRA head: Moderately motion limited without evidence of large vessel occlusion or proximal high-grade stenosis. MRA neck: Severely motion limited with patent carotids and nondiagnostic evaluation of the vertebral arteries. A CTA of the neck could better assess. Electronically Signed   By: Gilmore GORMAN Molt M.D.   On: 04/20/2024 22:00   EEG  adult Result Date: 04/20/2024 Shelton Arlin KIDD, MD     04/20/2024  4:37 PM Patient Name: Jacob [name] MRN: [id number] Epilepsy Attending: Arlin KIDD Shelton Referring Physician/Provider: Waddell Karna LABOR, NP Date: 04/20/2024 Duration: 26.17 mins Patient history: 73yo m with ams. EEG to evaluate for seizure Level of alertness: Awake, asleep AEDs during EEG study: None Technical aspects: This EEG study was done with scalp electrodes positioned according to the 10-20 International system of electrode placement. Electrical activity was reviewed with band pass filter of 1-70Hz , sensitivity of 7 uV/mm, display speed of 25mm/sec with a 60Hz  notched filter applied as appropriate. EEG data were recorded continuously and digitally stored.  Video monitoring was available and reviewed as appropriate. Description: The posterior dominant rhythm consists of 7 Hz activity of moderate voltage (25-35 uV) seen predominantly in posterior head regions, symmetric and reactive to eye opening and eye closing.  Sleep was characterized by vertex waves, sleep spindles (12 to 14 Hz), maximal frontocentral region.  Hyperventilation and photic stimulation were not performed.   ABNORMALITY - Background slow IMPRESSION: This study is suggestive of mild diffuse encephalopathy. No seizures or epileptiform discharges were seen throughout the recording. Priyanka O  Yadav   ECHOCARDIOGRAM COMPLETE Result Date: 04/20/2024    ECHOCARDIOGRAM REPORT   Patient Name:   SAMIL MECHAM Date of Exam: 04/20/2024 Medical Rec #:  [id number]       Height:       74.0 in Accession #:    [id number]      Weight:       133.6 lb Date of Birth:  1951-06-25        BSA:          1.831 m Patient Age:    73 years        BP:           120/60 mmHg Patient Gender: M               HR:           75 bpm. Exam Location:  Inpatient Procedure: 2D Echo, Cardiac Doppler and Color Doppler (Both Spectral and Color            Flow Doppler were utilized during procedure). Indications:     Stroke  History:        Patient has prior history of Echocardiogram examinations, most                 recent 08/23/2022. CHF and Ischmeic Cardiomyopathy, CAD and                 Previous Myocardial Infarction, Prior CABG, COPD, Mitral Valve                 Disease; Risk Factors:Hypertension, Diabetes, Dyslipidemia,                 Current Smoker and Family History of Coronary Artery Disease.                 Substance Abuse, End Stage Renal Disease, HIV, Acute Renal                 Failure.  Sonographer:    Logan Shove RDCS Referring Phys: [id number] CHRISTOPHER P DANFORD  Sonographer Comments: Image acquisition challenging due to patient behavioral factors. IMPRESSIONS  1. Left ventricular ejection fraction, by estimation, is 30 to 35%. The left ventricle has moderately decreased function. The left ventricle demonstrates regional wall motion abnormalities (see scoring diagram/findings for description). There is mild left ventricular hypertrophy. Left ventricular diastolic parameters are consistent with Grade II diastolic dysfunction (pseudonormalization). Elevated left atrial pressure.  2. Right ventricular systolic function is low normal. The right ventricular size is normal.  3. Left atrial size was severely dilated.  4. Right atrial size was mildly dilated.  5. The mitral valve is normal in structure. Mild mitral valve regurgitation. No evidence of mitral stenosis.  6. The aortic valve is tricuspid. Aortic valve regurgitation is not visualized. No aortic stenosis is present. FINDINGS  Left Ventricle: Left ventricular ejection fraction, by estimation, is 30 to 35%. The left ventricle has moderately decreased function. The left ventricle demonstrates regional wall motion abnormalities. The left ventricular internal cavity size was normal in size. There is mild left ventricular hypertrophy. Left ventricular diastolic parameters are consistent with Grade II diastolic dysfunction (pseudonormalization). Elevated left  atrial pressure.  LV Wall Scoring: The entire lateral wall, basal inferior segment, and apical inferior segment are hypokinetic. Right Ventricle: The right ventricular size is normal. Right vetricular wall thickness was not well visualized. Right ventricular systolic function is low normal. Left Atrium: Left atrial size was severely dilated. Right Atrium: Right atrial  size was mildly dilated. Pericardium: There is no evidence of pericardial effusion. Mitral Valve: The mitral valve is normal in structure. Mild mitral valve regurgitation. No evidence of mitral valve stenosis. Tricuspid Valve: The tricuspid valve is not well visualized. Tricuspid valve regurgitation is not demonstrated. No evidence of tricuspid stenosis. Aortic Valve: The aortic valve is tricuspid. Aortic valve regurgitation is not visualized. No aortic stenosis is present. Aortic valve mean gradient measures 2.0 mmHg. Aortic valve peak gradient measures 4.6 mmHg. Aortic valve area, by VTI measures 3.20 cm. Pulmonic Valve: The pulmonic valve was not well visualized. Pulmonic valve regurgitation is mild. No evidence of pulmonic stenosis. Aorta: The aortic root and ascending aorta are structurally normal, with no evidence of dilitation. IAS/Shunts: The interatrial septum was not well visualized.  LEFT VENTRICLE PLAX 2D LVIDd:         6.00 cm      Diastology LVIDs:         4.50 cm      LV e' medial:    5.44 cm/s LV PW:         1.20 cm      LV E/e' medial:  18.8 LV IVS:        1.20 cm      LV e' lateral:   5.77 cm/s LVOT diam:     2.10 cm      LV E/e' lateral: 17.7 LV SV:         70 LV SV Index:   38 LVOT Area:     3.46 cm  LV Volumes (MOD) LV vol d, MOD A2C: 230.0 ml LV vol d, MOD A4C: 237.0 ml LV vol s, MOD A2C: 154.0 ml LV vol s, MOD A4C: 164.0 ml LV SV MOD A2C:     76.0 ml LV SV MOD A4C:     237.0 ml LV SV MOD BP:      74.2 ml RIGHT VENTRICLE RV Basal diam:  4.80 cm RV Mid diam:    2.90 cm RV S prime:     6.31 cm/s TAPSE (M-mode): 1.0 cm LEFT ATRIUM              Index        RIGHT ATRIUM           Index LA diam:        5.10 cm 2.79 cm/m   RA Area:     21.30 cm LA Vol (A2C):   95.3 ml 52.05 ml/m  RA Volume:   63.70 ml  34.79 ml/m LA Vol (A4C):   92.8 ml 50.68 ml/m LA Biplane Vol: 94.7 ml 51.72 ml/m  AORTIC VALVE AV Area (Vmax):    3.14 cm AV Area (Vmean):   2.76 cm AV Area (VTI):     3.20 cm AV Vmax:           107.00 cm/s AV Vmean:          70.400 cm/s AV VTI:            0.220 m AV Peak Grad:      4.6 mmHg AV Mean Grad:      2.0 mmHg LVOT Vmax:         96.90 cm/s LVOT Vmean:        56.100 cm/s LVOT VTI:          0.203 m LVOT/AV VTI ratio: 0.92  AORTA Ao Root diam: 2.40 cm Ao Asc diam:  2.40 cm MITRAL VALVE MV Area (PHT): 4.80  cm     SHUNTS MV Decel Time: 158 msec     Systemic VTI:  0.20 m MV E velocity: 102.00 cm/s  Systemic Diam: 2.10 cm MV A velocity: 78.30 cm/s MV E/A ratio:  1.30 Dorn Ross MD Electronically signed by Dorn Ross MD Signature Date/Time: 04/20/2024/1:07:23 PM    Final    MR BRAIN WO CONTRAST Result Date: 04/19/2024 CLINICAL DATA:  Mental status change, unknown cause EXAM: MRI HEAD WITHOUT CONTRAST TECHNIQUE: Multiplanar, multiecho pulse sequences of the brain and surrounding structures were obtained without intravenous contrast. COMPARISON:  None Available. FINDINGS: Motion limited and incomplete study. Only DWI/ADC, axial FLAIR, and SWI were performed. Within this limitation: Brain: Multiple small acute or early subacute infarcts in the left midbrain and right frontoparietal white matter. No substantial mass effect. No midline shift. No evidence of obvious acute hemorrhage, mass lesion or hydrocephalus. Scattered T2/FLAIR hyperintensity white matter are nonspecific but compatible with chronic microvascular ischemic disease. Vascular: Not well evaluated without T2 imaging. Skull and upper cervical spine: Normal marrow signal. Sinuses/Orbits: Clear stenosis.  No acute orbital findings. Other: No mastoid effusions.  IMPRESSION: Motion limited and incomplete study. Multiple small acute or early subacute infarcts in the left midbrain and right frontoparietal white matter. Given involvement of multiple vascular territories, consider an embolic etiology. These results will be called to the ordering clinician or representative by the Radiologist Assistant, and communication documented in the PACS or [employer]. Electronically Signed   By: Gilmore GORMAN Molt M.D.   On: 04/19/2024 16:21   PERIPHERAL VASCULAR CATHETERIZATION Result Date: 04/18/2024 Images from the original result were not included.   Patient name: HARLES EVETTS         MRN: [id number]        DOB: [birth year]            Sex: male  04/18/2024 Pre-operative Diagnosis: Critical limb ischemia the right lower extremity with tissue loss (calf wound) Post-operative diagnosis:  Same Surgeon:  Lonni DOROTHA Gaskins, MD Procedure Performed: 1.  Ultrasound-guided access left common femoral artery 2.  Aortogram with catheter selection of aorta 3.  Right lower extremity arteriogram with catheter selection of the right common femoral artery 4.  Right distal SFA and popliteal artery angioplasty with shockwave lithotripsy (5 mm x 80 mm shockwave balloon x 400 pulses) 5.  Right proximal anterior tibial angioplasty with plain balloon and shockwave lithotripsy (3 mm x 80 mm Sterling and 3 mm x 80 mm shockwave balloon x 240 pulses) 6.  63 minutes of monitored moderate conscious sedation time  Indications: Patient is a 73 year old male that presents with right lower extremity tissue loss ongoing for the last 8 weeks.  He had dampened monophasic flow at the ankle with severely calcified tibial vessels.  He presents for angiogram and possible right leg intervention after risk-benefits discussed.  Findings:  Ultrasound-guided access left common femoral artery.  Aortogram showed patent infrarenal aorta with patent iliacs.  These are heavily calcified.  On the right he had a patent common  femoral and profunda.  The proximal SFA had less than 50% stenosis that was not flow-limiting.  Heavily calcified vessels.  The distal SFA as well as the above and below-knee popliteal artery had multiple segments of calcified 50 to 60% stenosis.  He has single-vessel runoff in the anterior tibial but has a high-grade proximal stenosis greater than 80% at the ostium of the vessel.  The remainder of the vessel is patent with filling of the dorsalis pedis.  Ultimately the right distal SFA as well as the entire above and below-knee popliteal artery was treated with a 5 mm shockwave balloon for total of 400 pulses in the entire segment inflated at 2 atm, 4 atm and 6 atm in each segment treated.  I then attempted plain balloon angioplasty of the proximal anterior tibial and the balloon did not open and there was still a high-grade stenosis due to severe calcification.  I then used a 3 mm shockwave balloon x 240 pulses here with better results.  He has maintained single-vessel runoff through the anterior tibial with inline flow.             Procedure:  The patient was identified in the holding area and taken to room 8.  The patient was then placed supine on the table and prepped and draped in the usual sterile fashion.  A time out was called.  Patient received Versed  and fentanyl  for conscious moderate sedation.  Vital signs were monitored including heart rate, respiratory rate, oxygenation and blood pressure.  I was present for all of moderate sedation.  Ultrasound was used to evaluate the left common femoral artery.  It was patent .  A digital ultrasound image was acquired.  A micropuncture needle was used to access the left common femoral artery under ultrasound guidance.  An 018 wire was advanced without resistance and a micropuncture sheath was placed.  The 018 wire was removed and a benson wire was placed.  The micropuncture sheath was exchanged for a 5 french sheath.  An omniflush catheter was advanced over the  wire to the level of L-1.  An abdominal angiogram was obtained.  Next, using the omniflush catheter and a benson wire, the aortic bifurcation was crossed and the catheter was placed into theright external iliac artery and right runoff was obtained.  Pertinent findings are noted above.  Ultimately elected for intervention.  I used a Bentson wire in the right SFA and upsized to a 6 [place] catapult sheath in the left groin over the aortic bifurcation placed into the right common femoral artery.  The patient was given 100 units/kg IV heparin .  I then used an 014 [place] core wire with a CXI catheter and was able to get down into the popliteal artery below the knee and then cannulate the anterior tibial and got a wire to the distal anterior tibial where the artery appeared healthier.  I then used shockwave lithotripsy and performed lithotripsy of the entire distal SFA above the below-knee popliteal artery inflated to 2 atm, 4 atm and 6 atm with multiple pulses throughout the segments for a total of 400 pulses.  I then used a plain balloon angioplasty of the proximal anterior tibial using a 3 mm x 80 mm Sterling.  I still had a significant residual stenosis.  I then exchanged for a shockwave balloon using a 3 mm x 80 mm Shockwave lithotripsy balloon and inflated this to 2 atm, 4 atm and 6 atm again using a total of 240 pulses.  There was better results here and better flow.  Patient has preserved single-vessel runoff in the anterior tibial artery  Wires and catheters removed and put short 6 French sheath in the left groin.  Taken to recovery for sheath removal.  Plan: Optimized from a vascular surgery standpoint.  Has inline flow through anterior tibial with single-vessel runoff.  Aspirin  statin Plavix  for risk reduction.   Lonni DOROTHA Gaskins, MD [facility] Office: [phone]  DG CHEST PORT 1 VIEW Result Date: 04/18/2024 CLINICAL DATA:  [id number] Hypoxia [id number] EXAM: PORTABLE CHEST -  1 VIEW COMPARISON:  April 04, 2024, January 23, 2024 FINDINGS: Unchanged bilateral perihilar interstitial opacities. No pleural effusion or pneumothorax. Sternotomy wires and CABG markers. Moderate cardiomegaly. Tortuous aorta with aortic atherosclerosis. No acute fracture or destructive lesions. Multilevel thoracic osteophytosis. IMPRESSION: Cardiomegaly with redemonstrated findings of interstitial edema. Electronically Signed   By: Rogelia Myers M.D.   On: 04/18/2024 12:11   DG Chest Portable 1 View Result Date: 04/04/2024 CLINICAL DATA:  Shortness of breath EXAM: PORTABLE CHEST 1 VIEW COMPARISON:  01/23/2024 FINDINGS: Cardiac shadow is enlarged but stable. Aortic calcifications are seen. Postsurgical changes are noted. Central vascular congestion is seen with mild interstitial edema. No focal confluent infiltrate is noted. No bony abnormality is seen. IMPRESSION: Changes of CHF. Electronically Signed   By: Oneil Devonshire M.D.   On: 04/04/2024 03:13     Subjective: - no chest pain, shortness of breath, no abdominal pain, nausea or vomiting.   Discharge Exam: BP 115/64 (BP Location: Left Arm)   Pulse (!) 101   Temp 97.7 F (36.5 C) (Oral)   Resp 14   Ht 6' 2 (1.88 m)   Wt 77.4 kg   SpO2 98%   BMI 21.91 kg/m   General: Pt is alert, awake, not in acute distress Cardiovascular: RRR, S1/S2 +, no rubs, no gallops Respiratory: CTA bilaterally, no wheezing, no rhonchi Abdominal: Soft, NT, ND, bowel sounds + Extremities: no edema, no cyanosis    The results of significant diagnostics from this hospitalization (including imaging, microbiology, ancillary and laboratory) are listed below for reference.     Microbiology: Recent Results (from the past 240 hours)  Culture, blood (Routine X 2) w Reflex to ID Panel     Status: None   Collection Time: 04/19/24 10:28 PM   Specimen: BLOOD LEFT HAND  Result Value Ref Range Status   Specimen Description BLOOD LEFT HAND  Final   Special Requests    Final    BOTTLES DRAWN AEROBIC AND ANAEROBIC Blood Culture results may not be optimal due to an inadequate volume of blood received in culture bottles   Culture   Final    NO GROWTH 5 DAYS Performed at [facility], [address], [place], [name] [address]    Report Status 04/24/2024 FINAL  Final  Culture, blood (Routine X 2) w Reflex to ID Panel     Status: None   Collection Time: 04/19/24 10:28 PM   Specimen: BLOOD LEFT HAND  Result Value Ref Range Status   Specimen Description BLOOD LEFT HAND  Final   Special Requests   Final    BOTTLES DRAWN AEROBIC AND ANAEROBIC Blood Culture results may not be optimal due to an inadequate volume of blood received in culture bottles   Culture   Final    NO GROWTH 5 DAYS Performed at [facility], [address], [place], [name] [address]    Report Status 04/24/2024 FINAL  Final     Labs: Basic Metabolic Panel: Recent Labs  Lab 04/21/24 1718 04/22/24 0805 04/23/24 1237 04/24/24 0502 04/25/24 1549 04/26/24 0345  NA 136 136 130* 133* 137 133*  K 5.5* 5.5* 5.6* 6.2* 5.7* 6.1*  CL 92* 92* 89* 90* 100 93*  CO2 26 27 25 25 23  20*  GLUCOSE 188* 155* 166* 116* 150* 164*  BUN 90* 117* 80* 98* 91* 55*  CREATININE 10.97*  12.10* 8.40* 9.59* 8.49* 6.13*  CALCIUM  9.5 9.1 9.3 9.6 8.7* 9.1  MG  --   --   --   --   --  2.3  PHOS 6.8* 7.2*  --   --   --   --    Liver Function Tests: Recent Labs  Lab 04/21/24 1718 04/22/24 0805 04/26/24 0345  AST  --   --  80*  ALT  --   --  29  ALKPHOS  --   --  75  BILITOT  --   --  1.7*  PROT  --   --  7.0  ALBUMIN  2.4* 2.2* 2.4*   CBC: Recent Labs  Lab 04/22/24 0448 04/23/24 1237 04/24/24 0502 04/25/24 0901 04/25/24 1549 04/25/24 2334 04/26/24 0345 04/26/24 0756 04/26/24 1603 04/27/24 1105  WBC 9.2 9.8 9.0  --  10.6*  --  13.5*  --   --   --   NEUTROABS  --   --   --   --  7.7  --   --   --   --   --   HGB 12.0* 10.8* 11.3*   < > 7.8* 8.3* 8.5* 8.5* 8.8* 9.1*  HCT 37.1* 33.0*  34.9*   < > 23.7* 26.0* 26.6* 26.3* 26.5* 27.5*  MCV 101.9* 99.7 98.9  --  101.3*  --  101.1*  --   --   --   PLT 148* 189 189  --  192  --  236  --   --   --    < > = values in this interval not displayed.   CBG: Recent Labs  Lab 04/26/24 1150 04/26/24 1603 04/26/24 2207 04/27/24 0629 04/27/24 1136  GLUCAP 152* 121* 127* 147* 142*   Hgb A1c No results for input(s): HGBA1C in the last 72 hours. Lipid Profile No results for input(s): CHOL, HDL, LDLCALC, TRIG, CHOLHDL, LDLDIRECT in the last 72 hours. Thyroid  function studies No results for input(s): TSH, T4TOTAL, T3FREE, THYROIDAB in the last 72 hours.  Invalid input(s): FREET3 Urinalysis    Component Value Date/Time   COLORURINE YELLOW 10/16/2020 2136   APPEARANCEUR CLEAR 10/16/2020 2136   LABSPEC 1.016 10/16/2020 2136   PHURINE 5.0 10/16/2020 2136   GLUCOSEU NEGATIVE 10/16/2020 2136   GLUCOSEU NEG mg/dL 88/71/7988 7941   HGBUR SMALL (A) 10/16/2020 2136   HGBUR negative 05/31/2010 0948   BILIRUBINUR NEGATIVE 10/16/2020 2136   KETONESUR NEGATIVE 10/16/2020 2136   PROTEINUR 30 (A) 10/16/2020 2136   UROBILINOGEN 0.2 06/24/2015 2336   NITRITE NEGATIVE 10/16/2020 2136   LEUKOCYTESUR NEGATIVE 10/16/2020 2136    FURTHER DISCHARGE INSTRUCTIONS:   Get Medicines reviewed and adjusted: Please take all your medications with you for your next visit with your Primary MD   Laboratory/radiological data: Please request your Primary MD to go over all hospital tests and procedure/radiological results at the follow up, please ask your Primary MD to get all Hospital records sent to his/her office.   In some cases, they will be blood work, cultures and biopsy results pending at the time of your discharge. Please request that your primary care M.D. goes through all the records of your hospital data and follows up on these results.   Also Note the following: If you experience worsening of your admission symptoms,  develop shortness of breath, life threatening emergency, suicidal or homicidal thoughts you must seek medical attention immediately by calling 911 or calling your MD immediately  if symptoms less severe.  You must read complete instructions/literature along with all the possible adverse reactions/side effects for all the Medicines you take and that have been prescribed to you. Take any new Medicines after you have completely understood and accpet all the possible adverse reactions/side effects.    Do not drive when taking Pain medications or sleeping medications (Benzodaizepines)   Do not take more than prescribed Pain, Sleep and Anxiety Medications. It is not advisable to combine anxiety,sleep and pain medications without talking with your primary care practitioner   Special Instructions: If you have smoked or chewed Tobacco  in the last 2 yrs please stop smoking, stop any regular Alcohol   and or any Recreational drug use.   Wear Seat belts while driving.   Please note: You were cared for by a hospitalist during your hospital stay. Once you are discharged, your primary care physician will handle any further medical issues. Please note that NO REFILLS for any discharge medications will be authorized once you are discharged, as it is imperative that you return to your primary care physician (or establish a relationship with a primary care physician if you do not have one) for your post hospital discharge needs so that they can reassess your need for medications and monitor your lab values.  Time coordinating discharge: 35 minutes  SIGNED:  Nilda Fendt, MD, PhD 04/28/2024, 1:01 PM

## 2024-04-28 NOTE — Progress Notes (Signed)
 Daily Progress Note   Patient Name: Jacob [name]       Date: 04/28/2024 DOB: December 19, 1950  Age: 73 y.o. MRN#: [id number] Attending Physician: Jacob Nilda HERO, MD Primary Care Physician: de [place], Jacob J, MD Admit Date: 04/17/2024  Reason for Consultation/Follow-up: Establishing goals of care   Length of Stay: 11  Current Medications: Scheduled Meds:   antiseptic oral rinse  15 mL Topical BID   Chlorhexidine  Gluconate Cloth  6 each Topical Q0600   QUEtiapine   25 mg Oral QHS   sodium chloride  flush  3 mL Intravenous Q12H   sodium chloride  flush  3 mL Intravenous Q12H    Continuous Infusions:   PRN Meds: acetaminophen  **OR** acetaminophen , artificial tears, diphenhydrAMINE , glycopyrrolate  **OR** glycopyrrolate  **OR** glycopyrrolate , haloperidol  **OR** haloperidol  **OR** haloperidol  lactate, HYDROmorphone  (DILAUDID ) injection, LORazepam  **OR** LORazepam  **OR** LORazepam , ondansetron  **OR** ondansetron  (ZOFRAN ) IV, mouth rinse, oxyCODONE , sodium chloride  flush, sodium chloride  flush  Physical Exam Vitals reviewed.  Constitutional:      General: He is sleeping.  Pulmonary:     Effort: Pulmonary effort is normal.  Skin:    General: Skin is warm and dry.             Vital Signs: BP 115/64 (BP Location: Left Arm)   Pulse (!) 101   Temp 97.7 F (36.5 C) (Oral)   Resp 14   Ht 6' 2 (1.88 m)   Wt 77.4 kg   SpO2 98%   BMI 21.91 kg/m  SpO2: SpO2: 98 % O2 Device: O2 Device: Room Air O2 Flow Rate: O2 Flow Rate (L/min): 1 L/min       Palliative Assessment/Data: 20%      Patient Active Problem List   Diagnosis Date Noted   Rectal bleeding 04/24/2024   Malnutrition of moderate degree 04/19/2024   Stroke (HCC) 04/19/2024   Acute metabolic encephalopathy 04/18/2024    History of hepatitis C 04/18/2024   History of GI bleeding due to AVMs 04/18/2024   Ischemic leg wound 04/17/2024   Leg wound, right 02/22/2024   Volume overload 01/23/2024   Fluid overload 06/14/2023   Encounter for smoking cessation counseling 03/07/2023   Diabetic infection of right foot (HCC) 07/18/2022   Chronic pain syndrome 03/29/2022   Chronic lower back pain    Thrombocytopenia (HCC)    Hypercalcemia 04/13/2021  Helicobacter pylori (H. pylori) infection 04/06/2021   Lumbar foraminal stenosis 03/30/2021   Gastric hemorrhage due to angiodysplasia of stomach    GI bleed 11/24/2020   COVID-19 virus infection 11/24/2020   Other mechanical complication of cranial or spinal infusion catheter, sequela 11/11/2020   Subdural hematoma (HCC) 11/06/2020   Fall at home, initial encounter 11/05/2020   Nicotine  dependence, cigarettes, uncomplicated 11/05/2020   Alcohol  abuse 11/05/2020   Unspecified trochanteric fracture of left femur, initial encounter for closed fracture (HCC) 11/05/2020   Traumatic subdural hematoma, initial encounter (HCC) 11/05/2020   Fracture of metatarsal of right foot, closed 11/05/2020   Nondisplaced fracture of greater trochanter of left femur, initial encounter for closed fracture (HCC)    Uremia    Angiodysplasia of intestine with hemorrhage    Pruritus, unspecified 10/02/2020   Iron  deficiency anemia    Acute blood loss anemia    Acute on chronic anemia    Abnormal nuclear stress test 12/19/2019   Type 2 diabetes mellitus with diabetic neuropathy, unspecified (HCC) 08/07/2019   Allergy, unspecified, initial encounter 08/02/2019   Complication of vascular dialysis catheter 08/02/2019   Drug abuse counseling and surveillance of drug abuser 08/02/2019   Other specified coagulation defects (HCC) 08/02/2019   Secondary hyperparathyroidism of renal origin (HCC) 08/02/2019   Unspecified atrial fibrillation (HCC) 08/02/2019   ESRD (end stage renal disease)  (HCC)    Cocaine abuse (HCC)    CHF exacerbation (HCC) 07/21/2019   Diabetic foot ulcer (HCC) 02/11/2019   AVM (arteriovenous malformation)    Melena 02/07/2019   Symptomatic anemia 02/06/2019   Hyperlipidemia associated with type 2 diabetes mellitus (HCC) 05/30/2018   Right foot ulcer (HCC) 02/28/2018   Chronic obstructive pulmonary disease (HCC) 02/28/2018   Anemia of chronic disease 11/20/2016   Chronic systolic CHF (congestive heart failure) (HCC) 11/10/2016   History of lumbar laminectomy for spinal cord decompression 02/29/2016   Type 2 diabetes mellitus with diabetic polyneuropathy, with long-term current use of insulin  (HCC) 06/17/2015   HTN (hypertension) 04/26/2015   DM (diabetes mellitus) (HCC) 04/26/2015   Ischemic cardiomyopathy 05/12/2014   Hyperkalemia 09/06/2013   DM II, diet controlled 12/30/2011   Bunion of left foot 11/25/2011   Bunion, right foot 11/25/2011   Polysubstance abuse (HCC) 07/28/2011   Hip fracture, left (HCC) 04/04/2011   Closed fracture of neck of femur (HCC) 04/04/2011   Insomnia 02/18/2011   Coronary artery disease with angina 04/20/2009   Acute on chronic combined systolic and diastolic CHF (congestive heart failure) (HCC) 04/20/2009   Mixed hyperlipidemia 11/20/2006   Gout 11/20/2006   TOBACCO ABUSE 11/20/2006   Essential hypertension 11/20/2006   Human immunodeficiency virus (HIV) disease (HCC) 09/02/2006    Palliative Care Assessment & Plan   Patient Profile: 73 y.o. male  with past medical history of ESRD on HD MWF, HIV, HTN, sCHF EF 40-45%, hx apical aneurysm, DM, CAD s/p CABG 2004 last PCI 2021, hep C, hx GIB due to AVMs, still smoking, PVD of RLE, chronic RLE wound admitted on 04/17/2024 with nonhealing right medial calf wound. His hospitalization was complicated by delirium.   Today's Discussion: Reviewed chart. Patient required PRN haldol , ativan , and oxycodone  overnight. [name]. When I approached his bed and  called out his name he opened his eyes. I asked if he was having pain and he moaned. Changed prn IV Dilaudid  order so the threshold is lower for administration. Requested prn dose from nursing.    11:20 am:  Called patient's sister Jacob [name]. We discussed patient's prn requirements overnight and this morning. Hopeful he will be accepted to [facility] soon. Encouraged her to call PMT with questions or concerns. PMT will continue to support.  12:15 pm: Updated [facility] liaison Nat Babe on patient's increased prn requirement and that he has not eaten anything today.  Recommendations/Plan: Changed to DNR/DNI Full comfort measures Symptom management per [facility] No further dialysis TOC order placed for inpatient hospice- [facility] PMT will continue to support   Symptom Management:  Oxycodone /Dilaudid  PRN for pain/air hunger/comfort Robinul  PRN for excessive secretions Ativan  PRN for agitation/anxiety Zofran  PRN for nausea Liquifilm tears PRN for dry eyes Haldol  PRN for agitation/anxiety May have comfort feeding Comfort cart for family Unrestricted visitations in the setting of EOL (per policy) Oxygen PRN 2L or less for comfort. No escalation.   Goals of Care and Additional Recommendations: Limitations on Scope of Treatment: Full Comfort Care  Code Status:    Code Status Orders  (From admission, onward)           Start     Ordered   04/27/24 1320  Do not attempt resuscitation (DNR) - Comfort care  Continuous       Question Answer Comment  If patient has no pulse and is not breathing Do Not Attempt Resuscitation   In Pre-Arrest Conditions (Patient Is Breathing and Has a Pulse) Provide comfort measures. Relieve any mechanical airway obstruction. Avoid transfer unless required for comfort.   Consent: Discussion documented in EHR or advanced directives reviewed      04/27/24 1323         Extensive chart review has been completed prior to seeing the patient including progress/consult  notes, orders, medications, and available advance directive documents.  Care plan was discussed with bedside RN and ACC liaison   Time spent: 55 minutes  Thank you for allowing the Palliative Medicine Team to assist in the care of this patient.   Stephane CHRISTELLA Palin, NP  Please contact Palliative Medicine Team phone at [phone] for questions and concerns.

## 2024-04-28 NOTE — Progress Notes (Signed)
 Brief Nephrology Note  Patient has moved to comfort measures. We will sign off. Appreciate help from palliative care team.

## 2024-04-28 NOTE — Progress Notes (Signed)
 Jolynn Pack [id number] [facility] Liaison Note  Referral received from [facility] for family interest in [facility] on yesterday. On yesterday patient did not meet criteria, however he has required more symptom management over the night and this morning.   [facility] is able to accept patient this afternoon once consents are complete.   RN staff, you may call report at any time to [facility] @ [phone], room is assigned when report is called.   Please leave IV intact and send completed DNR with patient.   Updated attending and [facility] manager and team via [employer].  Thank you for the opportunity to participate in this patient's care   Nat Babe, BSN, RN Hospice Nurse Liaison [phone]

## 2024-04-28 NOTE — TOC Transition Note (Signed)
 Transition of Care [facility]) - Discharge Note   Patient Details  Name: Jacob [name] MRN: [id number] Date of Birth: 24-Aug-1951  Transition of Care [facility] CM/SW Contact:  Gwenn Julien Norris, [name] Phone Number: 04/28/2024, 4:08 PM   Clinical Narrative: Pt for dc to [facility] today. Per Nat in [facility] admissions, pt's sister has completed consents and they are prepared to admit. RN has called report and [facility] arranged for transport. SW signing off at dc.   Julien Gwenn, MSW, LCSW [phone] (coverage)        Final next level of care: Hospice Medical Facility Barriers to Discharge: Barriers Resolved   Patient Goals and CMS Choice            Discharge Placement                Patient to be transferred to facility by: [facility] Name of family member notified: Marsha/Sister Patient and family notified of of transfer: 04/28/24  Discharge Plan and Services Additional resources added to the After Visit Summary for                                       Social Drivers of Health (SDOH) Interventions SDOH Screenings   Food Insecurity: Patient Declined (04/18/2024)  Housing: Unknown (02/27/2024)  Transportation Needs: No Transportation Needs (02/27/2024)  Utilities: Not At Risk (02/27/2024)  Depression (PHQ2-9): Low Risk  (03/05/2024)  Financial Resource Strain: Low Risk  (10/26/2020)  Physical Activity: Insufficiently Active (06/26/2018)  Social Connections: Somewhat Isolated (06/26/2018)  Stress: No Stress Concern Present (02/27/2024)  Tobacco Use: High Risk (04/17/2024)  Health Literacy: Adequate Health Literacy (02/27/2024)     Readmission Risk Interventions    06/15/2023    2:18 PM  Readmission Risk Prevention Plan  Transportation Screening Complete  Medication Review (RN Care Manager) Referral to Pharmacy  PCP or Specialist appointment within 3-5 days of discharge Complete  HRI or Home Care Consult Complete  SW Recovery Care/Counseling Consult Complete   Palliative Care Screening Not Applicable  Skilled Nursing Facility Not Applicable

## 2024-04-28 NOTE — Plan of Care (Signed)
 Problem: Education: Goal: Ability to describe self-care measures that may prevent or decrease complications (Diabetes Survival Skills Education) will improve 04/28/2024 1508 by Bernardine Nelwyn DASEN, RN Outcome: Progressing 04/28/2024 1507 by Bernardine Nelwyn DASEN, RN Outcome: Progressing Goal: Individualized Educational Video(s) 04/28/2024 1508 by Bernardine Nelwyn DASEN, RN Outcome: Progressing 04/28/2024 1507 by Bernardine Nelwyn DASEN, RN Outcome: Progressing   Problem: Coping: Goal: Ability to adjust to condition or change in health will improve 04/28/2024 1508 by Bernardine Nelwyn DASEN, RN Outcome: Progressing 04/28/2024 1507 by Bernardine Nelwyn DASEN, RN Outcome: Progressing   Problem: Fluid Volume: Goal: Ability to maintain a balanced intake and output will improve 04/28/2024 1508 by Bernardine Nelwyn DASEN, RN Outcome: Progressing 04/28/2024 1507 by Bernardine Nelwyn DASEN, RN Outcome: Progressing   Problem: Health Behavior/Discharge Planning: Goal: Ability to identify and utilize available resources and services will improve 04/28/2024 1508 by Bernardine Nelwyn DASEN, RN Outcome: Progressing 04/28/2024 1507 by Bernardine Nelwyn DASEN, RN Outcome: Progressing Goal: Ability to manage health-related needs will improve 04/28/2024 1508 by Bernardine Nelwyn DASEN, RN Outcome: Progressing 04/28/2024 1507 by Bernardine Nelwyn DASEN, RN Outcome: Progressing   Problem: Metabolic: Goal: Ability to maintain appropriate glucose levels will improve 04/28/2024 1508 by Bernardine Nelwyn DASEN, RN Outcome: Progressing 04/28/2024 1507 by Bernardine Nelwyn DASEN, RN Outcome: Progressing   Problem: Nutritional: Goal: Maintenance of adequate nutrition will improve 04/28/2024 1508 by Bernardine Nelwyn DASEN, RN Outcome: Progressing 04/28/2024 1507 by Bernardine Nelwyn DASEN, RN Outcome: Progressing Goal: Progress toward achieving an optimal weight will improve 04/28/2024 1508 by Bernardine Nelwyn DASEN, RN Outcome: Progressing 04/28/2024 1507 by Bernardine Nelwyn DASEN, RN Outcome: Progressing   Problem:  Skin Integrity: Goal: Risk for impaired skin integrity will decrease 04/28/2024 1508 by Bernardine Nelwyn DASEN, RN Outcome: Progressing 04/28/2024 1507 by Bernardine Nelwyn DASEN, RN Outcome: Progressing   Problem: Tissue Perfusion: Goal: Adequacy of tissue perfusion will improve 04/28/2024 1508 by Bernardine Nelwyn DASEN, RN Outcome: Progressing 04/28/2024 1507 by Bernardine Nelwyn DASEN, RN Outcome: Progressing   Problem: Education: Goal: Knowledge of General Education information will improve Description: Including pain rating scale, medication(s)/side effects and non-pharmacologic comfort measures 04/28/2024 1508 by Bernardine Nelwyn DASEN, RN Outcome: Progressing 04/28/2024 1507 by Bernardine Nelwyn DASEN, RN Outcome: Progressing   Problem: Health Behavior/Discharge Planning: Goal: Ability to manage health-related needs will improve 04/28/2024 1508 by Bernardine Nelwyn DASEN, RN Outcome: Progressing 04/28/2024 1507 by Bernardine Nelwyn DASEN, RN Outcome: Progressing   Problem: Clinical Measurements: Goal: Ability to maintain clinical measurements within normal limits will improve 04/28/2024 1508 by Bernardine Nelwyn DASEN, RN Outcome: Progressing 04/28/2024 1507 by Bernardine Nelwyn DASEN, RN Outcome: Progressing Goal: Will remain free from infection 04/28/2024 1508 by Bernardine Nelwyn DASEN, RN Outcome: Progressing 04/28/2024 1507 by Bernardine Nelwyn DASEN, RN Outcome: Progressing Goal: Diagnostic test results will improve 04/28/2024 1508 by Bernardine Nelwyn DASEN, RN Outcome: Progressing 04/28/2024 1507 by Bernardine Nelwyn DASEN, RN Outcome: Progressing Goal: Respiratory complications will improve 04/28/2024 1508 by Bernardine Nelwyn DASEN, RN Outcome: Progressing 04/28/2024 1507 by Bernardine Nelwyn DASEN, RN Outcome: Progressing Goal: Cardiovascular complication will be avoided 04/28/2024 1508 by Bernardine Nelwyn DASEN, RN Outcome: Progressing 04/28/2024 1507 by Bernardine Nelwyn DASEN, RN Outcome: Progressing   Problem: Activity: Goal: Risk for activity intolerance will  decrease 04/28/2024 1508 by Bernardine Nelwyn DASEN, RN Outcome: Progressing 04/28/2024 1507 by Bernardine Nelwyn DASEN, RN Outcome: Progressing   Problem: Nutrition: Goal: Adequate nutrition will be maintained 04/28/2024 1508 by Bernardine Nelwyn DASEN, RN Outcome: Progressing 04/28/2024 1507 by Bernardine Nelwyn DASEN, RN Outcome: Progressing  Problem: Coping: Goal: Level of anxiety will decrease 04/28/2024 1508 by Bernardine Nelwyn DASEN, RN Outcome: Progressing 04/28/2024 1507 by Bernardine Nelwyn DASEN, RN Outcome: Progressing   Problem: Elimination: Goal: Will not experience complications related to bowel motility 04/28/2024 1508 by Bernardine Nelwyn DASEN, RN Outcome: Progressing 04/28/2024 1507 by Bernardine Nelwyn DASEN, RN Outcome: Progressing Goal: Will not experience complications related to urinary retention 04/28/2024 1508 by Bernardine Nelwyn DASEN, RN Outcome: Progressing 04/28/2024 1507 by Bernardine Nelwyn DASEN, RN Outcome: Progressing   Problem: Pain Managment: Goal: General experience of comfort will improve and/or be controlled 04/28/2024 1508 by Bernardine Nelwyn DASEN, RN Outcome: Progressing 04/28/2024 1507 by Bernardine Nelwyn DASEN, RN Outcome: Progressing   Problem: Safety: Goal: Ability to remain free from injury will improve 04/28/2024 1508 by Bernardine Nelwyn DASEN, RN Outcome: Progressing 04/28/2024 1507 by Bernardine Nelwyn DASEN, RN Outcome: Progressing   Problem: Skin Integrity: Goal: Risk for impaired skin integrity will decrease 04/28/2024 1508 by Bernardine Nelwyn DASEN, RN Outcome: Progressing 04/28/2024 1507 by Bernardine Nelwyn DASEN, RN Outcome: Progressing   Problem: Education: Goal: Understanding of CV disease, CV risk reduction, and recovery process will improve 04/28/2024 1508 by Bernardine Nelwyn DASEN, RN Outcome: Progressing 04/28/2024 1507 by Bernardine Nelwyn DASEN, RN Outcome: Progressing Goal: Individualized Educational Video(s) 04/28/2024 1508 by Bernardine Nelwyn DASEN, RN Outcome: Progressing 04/28/2024 1507 by Bernardine Nelwyn DASEN, RN Outcome:  Progressing   Problem: Activity: Goal: Ability to return to baseline activity level will improve 04/28/2024 1508 by Bernardine Nelwyn DASEN, RN Outcome: Progressing 04/28/2024 1507 by Bernardine Nelwyn DASEN, RN Outcome: Progressing   Problem: Cardiovascular: Goal: Ability to achieve and maintain adequate cardiovascular perfusion will improve 04/28/2024 1508 by Bernardine Nelwyn DASEN, RN Outcome: Progressing 04/28/2024 1507 by Bernardine Nelwyn DASEN, RN Outcome: Progressing Goal: Vascular access site(s) Level 0-1 will be maintained 04/28/2024 1508 by Bernardine Nelwyn DASEN, RN Outcome: Progressing 04/28/2024 1507 by Bernardine Nelwyn DASEN, RN Outcome: Progressing   Problem: Health Behavior/Discharge Planning: Goal: Ability to safely manage health-related needs after discharge will improve 04/28/2024 1508 by Bernardine Nelwyn DASEN, RN Outcome: Progressing 04/28/2024 1507 by Bernardine Nelwyn DASEN, RN Outcome: Progressing   Problem: Education: Goal: Knowledge of disease or condition will improve 04/28/2024 1508 by Bernardine Nelwyn DASEN, RN Outcome: Progressing 04/28/2024 1507 by Bernardine Nelwyn DASEN, RN Outcome: Progressing Goal: Knowledge of secondary prevention will improve (MUST DOCUMENT ALL) 04/28/2024 1508 by Bernardine Nelwyn DASEN, RN Outcome: Progressing 04/28/2024 1507 by Bernardine Nelwyn DASEN, RN Outcome: Progressing Goal: Knowledge of patient specific risk factors will improve (DELETE if not current risk factor) 04/28/2024 1508 by Bernardine Nelwyn DASEN, RN Outcome: Progressing 04/28/2024 1507 by Bernardine Nelwyn DASEN, RN Outcome: Progressing   Problem: Ischemic Stroke/TIA Tissue Perfusion: Goal: Complications of ischemic stroke/TIA will be minimized 04/28/2024 1508 by Bernardine Nelwyn DASEN, RN Outcome: Progressing 04/28/2024 1507 by Bernardine Nelwyn DASEN, RN Outcome: Progressing   Problem: Coping: Goal: Will verbalize positive feelings about self 04/28/2024 1508 by Bernardine Nelwyn DASEN, RN Outcome: Progressing 04/28/2024 1507 by Bernardine Nelwyn DASEN, RN Outcome:  Progressing Goal: Will identify appropriate support needs 04/28/2024 1508 by Bernardine Nelwyn DASEN, RN Outcome: Progressing 04/28/2024 1507 by Bernardine Nelwyn DASEN, RN Outcome: Progressing   Problem: Health Behavior/Discharge Planning: Goal: Ability to manage health-related needs will improve 04/28/2024 1508 by Bernardine Nelwyn DASEN, RN Outcome: Progressing 04/28/2024 1507 by Bernardine Nelwyn DASEN, RN Outcome: Progressing Goal: Goals will be collaboratively established with patient/family 04/28/2024 1508 by Bernardine Nelwyn DASEN, RN Outcome: Progressing 04/28/2024 1507 by Bernardine Nelwyn DASEN, RN Outcome: Progressing  Problem: Self-Care: Goal: Ability to participate in self-care as condition permits will improve 04/28/2024 1508 by Bernardine Nelwyn DASEN, RN Outcome: Progressing 04/28/2024 1507 by Bernardine Nelwyn DASEN, RN Outcome: Progressing Goal: Verbalization of feelings and concerns over difficulty with self-care will improve 04/28/2024 1508 by Bernardine Nelwyn DASEN, RN Outcome: Progressing 04/28/2024 1507 by Bernardine Nelwyn DASEN, RN Outcome: Progressing Goal: Ability to communicate needs accurately will improve 04/28/2024 1508 by Bernardine Nelwyn DASEN, RN Outcome: Progressing 04/28/2024 1507 by Bernardine Nelwyn DASEN, RN Outcome: Progressing

## 2024-04-28 NOTE — Progress Notes (Deleted)
 PROGRESS NOTE  Jacob [name] FMW:[id number] DOB: 1951-09-08 DOA: 04/17/2024 PCP: de [place], Raymond J, MD   LOS: 11 days   Brief Narrative / Interim history: 73 y.o. M with ESRD on HD MWF, HIV, HTN, sCHF EF 40-45%, hx apical aneurysm, DM, CAD s/p CABG 2004 last PCI 2021, hep C, hx GIB due to AVMs, still smoking, PVD of RLE, chronic RLE wound who presented with nonhealing right medial calf wound.  Hospitalization complicated by delirium.  Subjective / 24h Interval events: Remains lethargic, intermittently opening her eyes but cannot contribute to a coherent discussion  Assesement and Plan: Principal Problem:   Ischemic leg wound Active Problems:   ESRD (end stage renal disease) (HCC)   Stroke (HCC)   Human immunodeficiency virus (HIV) disease (HCC)   Essential hypertension   Coronary artery disease with angina   DM (diabetes mellitus) (HCC)   Chronic systolic CHF (congestive heart failure) (HCC)   Anemia of chronic disease   Chronic obstructive pulmonary disease (HCC)   Acute metabolic encephalopathy   History of hepatitis C   History of GI bleeding due to AVMs   Malnutrition of moderate degree   Rectal bleeding   Principal problem Goals of care -patient came to the hospital with ischemic leg wound, peripheral vascular disease requiring vascular surgery intervention.  Further workup also showed a stroke, and hospital course complicated by lower GI bleed.  Despite medical treatment, he has been getting worse, intermittently confused, more lethargic and interfering with his dialysis requiring a sitter.  Given decline despite medical interventions, palliative care consulted, and eventually he was transitioned to comfort  Active problems  Ischemic leg wound, Peripheral vascular disease - Repeated ABI that now showed high grade stenosis in the RLE. Cardiology and vascular surgery consulted on admission, underwent angiography and angioplasty with shockwave lithotripsy on 6/26 with Dr.  Gretta. Now optimized   New onset lower GI bleed -7/2, after he finishes dialysis session.  Patient experienced bright red blood per rectum, large-volume.  Has not had an episode since.  Gastroenterology consulted, however he has refused to engage with the GI team, refused DRE and GI will watch from afar.  Hemoglobin stable  Acute metabolic encephalopathy, Uremia - At baseline, patient with possible dementia, but no diagnosed memory loss and living independently, managing own medicines and dialysis. Here, he was very disoriented on admission, and had waxing and waning confusion/somnolence since.   TSH, ammonia, B12, LFTs, RPR all normal.  CK normal.  No prior COPD.  Obtained MRI that showed strokes, and suspect at this time that his delirium is from stroke superimposed on undiagnosed early dementia.    Stroke [facility]) - MRI brain showed scattered left MCA punctate infarcts. MRA showed no high grade intracranial stenoses. Carotid US  showed no stenoses. Echocardiogram showed reduced EF but no cardiogenic source of embolism.   Hyperkalemia-in the setting of renal disease  ESRD -off dialysis, comfort   Human immunodeficiency virus (HIV) disease (HCC) - HIV quant last Jan undetectable  History of GI bleeding due to AVMs -new GI bleed as above   History of hepatitis C - LFTs normal   Chronic obstructive pulmonary disease (HCC)    Anemia of chronic disease - Hgb stable   Chronic systolic CHF (congestive heart failure) (HCC) LV aneurysm resolved. - EF down from 40% to 30-35% here.    DM (diabetes mellitus) (HCC) - Glucose controlled.  Continue sliding scale   Coronary artery disease with angina Hypertension    Scheduled Meds:  antiseptic oral rinse  15 mL Topical BID   Chlorhexidine  Gluconate Cloth  6 each Topical Q0600   QUEtiapine   25 mg Oral QHS   sodium chloride  flush  3 mL Intravenous Q12H   sodium chloride  flush  3 mL Intravenous Q12H   Continuous Infusions: PRN Meds:.acetaminophen   **OR** acetaminophen , artificial tears, diphenhydrAMINE , glycopyrrolate  **OR** glycopyrrolate  **OR** glycopyrrolate , haloperidol  **OR** haloperidol  **OR** haloperidol  lactate, HYDROmorphone  (DILAUDID ) injection, LORazepam  **OR** LORazepam  **OR** LORazepam , ondansetron  **OR** ondansetron  (ZOFRAN ) IV, mouth rinse, oxyCODONE , sodium chloride  flush, sodium chloride  flush  Current Outpatient Medications  Medication Instructions   acetaminophen  (TYLENOL ) 1,300 mg, 3 times daily   albuterol  (PROVENTIL ) 2.5 mg, Nebulization, Every 6 hours PRN   allopurinol  (ZYLOPRIM ) 100 mg, Oral, Daily   atorvastatin  (LIPITOR ) 10 MG tablet TAKE 1 TABLET(10 MG) BY MOUTH DAILY.   B-D UF III MINI PEN NEEDLES 31G X 5 MM MISC 1 Box, Subcutaneous, 4 times daily   BIKTARVY  50-200-25 MG TABS tablet TAKE 1 TABLET BY MOUTH 1 TIME A DAY   Calcium  Acetate 667-2,001 mg, See admin instructions   carvedilol  (COREG ) 3.125 MG tablet TAKE 1 TABLET(3.125 MG) BY MOUTH TWICE DAILY WITH A MEAL   diclofenac  Sodium (VOLTAREN ) 1 % GEL Apply 2 grams topically to the affected area three times daily as needed for pain.   folic acid  (FOLVITE ) 1 mg, Oral, Daily   gabapentin  (NEURONTIN ) 300 mg, Oral, 2 times daily, Patient is taking 1 tablet in the early evening after dialysis and 1 tablet at bedtime   HumaLOG  KwikPen 15 Units, Subcutaneous, 3 times daily   insulin  degludec (TRESIBA ) 15 Units, Subcutaneous, Daily, 3   isosorbide  mononitrate (IMDUR ) 30 mg, Oral, Daily   latanoprost  (XALATAN ) 0.005 % ophthalmic solution 1 drop, Daily at bedtime   leptospermum manuka honey (MEDIHONEY) PSTE paste 1 Application, Topical, Daily, Cleanse R lower leg wound with Vashe wound cleanser Soila [phone]), do not rinse and allow to air dry. Apply Medihoney to wound bed daily, cover with dry gauze and cover with silicone foam or ABD pad and Kerlix roll gauze to secure.Interchangeable with TheraHoney Apply thin layer (3 mm) to wound.   lidocaine -prilocaine  (EMLA )  cream As directed   Menthol -Camphor (TIGER BALM ARTHRITIS RUB) 11-11 % CREA 1 Application, As needed   midodrine  (PROAMATINE ) 10 mg, 3 times weekly   multivitamin (RENA-VIT) TABS tablet 1 tablet, Daily   neomycin-bacitracin-polymyxin [phone] OINT 1 Application, Apply externally, 2 times daily   nitroGLYCERIN  (NITROSTAT ) 0.3 mg, Sublingual, Every 5 min PRN   octreotide  (SANDOSTATIN  LAR DEPOT) 10 MG injection Inject 10 mg into the muscle every 28 days.   oxyCODONE -acetaminophen  (PERCOCET) 5-325 MG tablet 1 tablet, Oral, Every 8 hours PRN   polyethylene glycol (MIRALAX  / GLYCOLAX ) 17 g, Daily PRN   PREVIDENT 5000 PLUS 1.1 % CREA dental cream 1 Application, Daily at bedtime   simethicone  (MYLICON) 125 mg, As needed   timolol  (TIMOPTIC ) 0.5 % ophthalmic solution 1 drop, Every morning   umeclidinium-vilanterol (ANORO ELLIPTA ) 62.5-25 MCG/ACT AEPB 1 puff, Inhalation, Daily    Diet Orders (From admission, onward)     Start     Ordered   04/27/24 1320  Diet regular Room service appropriate? Yes; Fluid consistency: Thin  Diet effective now       Comments: May eat/drink as desired for end of life- careful hand feed  Question Answer Comment  Room service appropriate? Yes   Fluid consistency: Thin      04/27/24 1323  DVT prophylaxis:    Lab Results  Component Value Date   PLT 236 04/26/2024      Code Status: Do not attempt resuscitation (DNR) - Comfort care  Family Communication: no family at bedside   Status is: Inpatient Remains inpatient appropriate because: severity of illness  Level of care: Telemetry Medical  Consultants:  Nephrology Vascular Cards  Objective: Vitals:   04/27/24 0500 04/27/24 1137 04/27/24 1939 04/28/24 0832  BP:  100/60 (!) 90/50 115/64  Pulse:  89 91 (!) 101  Resp:  16  14  Temp:  98.3 F (36.8 C) 97.9 F (36.6 C) 97.7 F (36.5 C)  TempSrc:   Oral Oral  SpO2:  100%  98%  Weight: 77.4 kg     Height:        Intake/Output  Summary (Last 24 hours) at 04/28/2024 1018 Last data filed at 04/28/2024 0516 Gross per 24 hour  Intake 120 ml  Output --  Net 120 ml   Wt Readings from Last 3 Encounters:  04/27/24 77.4 kg  03/05/24 89.6 kg  02/22/24 89.3 kg    Examination:  Constitutional: NAD Eyes: lids and conjunctivae normal, no scleral icterus ENMT: mmm Neck: normal, supple Respiratory: clear to auscultation bilaterally, no wheezing, no crackles. Normal respiratory effort.  Cardiovascular: Regular rate and rhythm, no murmurs / rubs / gallops. No LE edema. Abdomen: soft, no distention, no tenderness. Bowel sounds positive.   Data Reviewed: I have independently reviewed following labs and imaging studies   CBC Recent Labs  Lab 04/22/24 0448 04/23/24 1237 04/24/24 0502 04/25/24 0901 04/25/24 1549 04/25/24 2334 04/26/24 0345 04/26/24 0756 04/26/24 1603 04/27/24 1105  WBC 9.2 9.8 9.0  --  10.6*  --  13.5*  --   --   --   HGB 12.0* 10.8* 11.3*   < > 7.8* 8.3* 8.5* 8.5* 8.8* 9.1*  HCT 37.1* 33.0* 34.9*   < > 23.7* 26.0* 26.6* 26.3* 26.5* 27.5*  PLT 148* 189 189  --  192  --  236  --   --   --   MCV 101.9* 99.7 98.9  --  101.3*  --  101.1*  --   --   --   MCH 33.0 32.6 32.0  --  33.3  --  32.3  --   --   --   MCHC 32.3 32.7 32.4  --  32.9  --  32.0  --   --   --   RDW 14.9 14.9 14.8  --  15.0  --  15.1  --   --   --   LYMPHSABS  --   --   --   --  1.5  --   --   --   --   --   MONOABS  --   --   --   --  1.1*  --   --   --   --   --   EOSABS  --   --   --   --  0.2  --   --   --   --   --   BASOSABS  --   --   --   --  0.1  --   --   --   --   --    < > = values in this interval not displayed.    Recent Labs  Lab 04/21/24 1718 04/22/24 0805 04/23/24 1237 04/24/24 0502 04/25/24 1549 04/26/24 0345  NA 136 136  130* 133* 137 133*  K 5.5* 5.5* 5.6* 6.2* 5.7* 6.1*  CL 92* 92* 89* 90* 100 93*  CO2 26 27 25 25 23  20*  GLUCOSE 188* 155* 166* 116* 150* 164*  BUN 90* 117* 80* 98* 91* 55*  CREATININE  10.97* 12.10* 8.40* 9.59* 8.49* 6.13*  CALCIUM  9.5 9.1 9.3 9.6 8.7* 9.1  AST  --   --   --   --   --  80*  ALT  --   --   --   --   --  29  ALKPHOS  --   --   --   --   --  75  BILITOT  --   --   --   --   --  1.7*  ALBUMIN  2.4* 2.2*  --   --   --  2.4*  MG  --   --   --   --   --  2.3  LATICACIDVEN  --   --   --  1.5  --   --     ------------------------------------------------------------------------------------------------------------------ No results for input(s): CHOL, HDL, LDLCALC, TRIG, CHOLHDL, LDLDIRECT in the last 72 hours.  Lab Results  Component Value Date   HGBA1C 6.9 (H) 04/20/2024   ------------------------------------------------------------------------------------------------------------------ No results for input(s): TSH, T4TOTAL, T3FREE, THYROIDAB in the last 72 hours.  Invalid input(s): FREET3  Cardiac Enzymes No results for input(s): CKMB, TROPONINI, MYOGLOBIN in the last 168 hours.  Invalid input(s): CK ------------------------------------------------------------------------------------------------------------------    Component Value Date/Time   BNP 2,378.1 (H) 01/23/2024 1931    CBG: Recent Labs  Lab 04/26/24 1150 04/26/24 1603 04/26/24 2207 04/27/24 0629 04/27/24 1136  GLUCAP 152* 121* 127* 147* 142*    Recent Results (from the past 240 hours)  Culture, blood (Routine X 2) w Reflex to ID Panel     Status: None   Collection Time: 04/19/24 10:28 PM   Specimen: BLOOD LEFT HAND  Result Value Ref Range Status   Specimen Description BLOOD LEFT HAND  Final   Special Requests   Final    BOTTLES DRAWN AEROBIC AND ANAEROBIC Blood Culture results may not be optimal due to an inadequate volume of blood received in culture bottles   Culture   Final    NO GROWTH 5 DAYS Performed at [facility], [address], [place], [name] [address]    Report Status 04/24/2024 FINAL  Final  Culture, blood (Routine X 2) w  Reflex to ID Panel     Status: None   Collection Time: 04/19/24 10:28 PM   Specimen: BLOOD LEFT HAND  Result Value Ref Range Status   Specimen Description BLOOD LEFT HAND  Final   Special Requests   Final    BOTTLES DRAWN AEROBIC AND ANAEROBIC Blood Culture results may not be optimal due to an inadequate volume of blood received in culture bottles   Culture   Final    NO GROWTH 5 DAYS Performed at [facility], [address], [place], [name] [address]    Report Status 04/24/2024 FINAL  Final     Radiology Studies: CT HEAD WO CONTRAST ( ) Result Date: 04/27/2024 CLINICAL DATA:  73 year old male with altered mental status, embolic appearing infarcts on MRI last month. EXAM: CT HEAD WITHOUT CONTRAST TECHNIQUE: Contiguous axial images were obtained from the base of the skull through the vertex without intravenous contrast. RADIATION DOSE REDUCTION: This exam was performed according to the departmental dose-optimization program which includes automated exposure  control, adjustment of the mA and/or kV according to patient size and/or use of iterative reconstruction technique. COMPARISON:  Brain MRI 04/19/2024.  Head CT 12/27/2023. FINDINGS: Brain: Stable cerebral volume since March. No midline shift, ventriculomegaly, mass effect, evidence of mass lesion, intracranial hemorrhage or evidence of cortically based acute infarction. Subcentimeter infarcts on MRI last month occult by CT. Maintained gray-white differentiation. Vascular: Calcified atherosclerosis at the skull base. No suspicious intracranial vascular hyperdensity. Skull: Nondisplaced anterior C1 ring fracture is visible on series 4, image 37, unchanged from prior head CT 11/06/2020. No acute osseous abnormality identified. Sinuses/Orbits: Visualized paranasal sinuses and mastoids are stable and well aerated. Other: Calcified scalp vessel atherosclerosis. No acute orbit or scalp soft tissue finding. IMPRESSION: 1. No acute intracranial  abnormality. 2. Subcentimeter infarcts on MRI last month occult by CT. 3. Advanced calcified atherosclerosis. 4. Chronic nondisplaced C1 ring fracture, stable since 2022. Electronically Signed   By: VEAR Hurst M.D.   On: 04/27/2024 12:46      Nilda Fendt, MD, PhD [facility]  Between 7 am - 7 pm I am available, please contact me via Amion (for emergencies) or Securechat (non urgent messages)  Between 7 pm - 7 am I am not available, please contact night coverage MD/APP via Amion

## 2024-04-30 ENCOUNTER — Ambulatory Visit: Admitting: Family

## 2024-04-30 ENCOUNTER — Ambulatory Visit: Payer: 59 | Admitting: Internal Medicine

## 2024-04-30 NOTE — Progress Notes (Deleted)
 Brief Narrative   [name], male    DOB: [birth year], 73 y.o.   MRN: [id number]  Jacob [name] is a 73 y/o AA  male diagnosed with HIV-1 disease in 1995 with risk factor of MSM. Initial viral load and CD4 count are unknown. No genotypes available. No history of opportunistic infections. ART experienced with atazanaivr/lamivudine /zidovudine ; Stribild; Genvoya , and Biktarvy .   Subjective:   No chief complaint on file.   HPI:  Jacob [name] is a 73 y.o. male with HIV disease last seen by Dr. Efrain on 10/31/23 with well-controlled virus and good adherence and tolerance to Biktarvy .  Viral load was undetectable with CD4 count 471.  Continues on hemodialysis.  Here today for routine follow-up.      Denies fevers, chills, night sweats, headaches, changes in vision, neck pain/stiffness, nausea, diarrhea, vomiting, lesions or rashes.  Lab Results  Component Value Date   CD4TCELL 33 10/31/2023   CD4TABS 322 (L) 09/08/2022   Lab Results  Component Value Date   HIV1RNAQUANT <20 04/23/2024     Allergies  Allergen Reactions   Augmentin  [Amoxicillin -Pot Clavulanate] Diarrhea and Other (See Comments)    Severe diarrhea   Morphine  Sulfate Anaphylaxis    Can't mix with oxycodone     Mucinex  Fast-Max Other (See Comments)    Intense sweating    Amphetamines Other (See Comments)    Unknown reaction      Outpatient Medications Prior to Visit  Medication Sig Dispense Refill   albuterol  (PROVENTIL ) (2.5 MG/3ML) 0.083% nebulizer solution Take 3 mLs (2.5 mg total) by nebulization every 6 (six) hours as needed for wheezing or shortness of breath. 75 mL 2   oxyCODONE -acetaminophen  (PERCOCET) 5-325 MG tablet Take 1 tablet by mouth every 8 (eight) hours as needed for severe pain (pain score 7-10). 90 tablet 0   No facility-administered medications prior to visit.     Past Medical History:  Diagnosis Date   Acute respiratory failure (HCC) 03/01/2018   Anemia    Arthritis     all over; mostly knees and back (02/28/2018)   Chronic combined systolic and diastolic CHF (congestive heart failure) (HCC)    Chronic lower back pain    stenosis   Community acquired pneumonia 09/06/2013   COPD (chronic obstructive pulmonary disease) (HCC)    Coronary atherosclerosis of native coronary artery    a. 02/2003 s/p CABG x 2 (VG->RI, VG->RPDA; b. 11/2019 PCI: LM nl, LAD 90d, D3 50, RI 100, LCX 100p, OM3 100 - fills via L->L collats from D2/dLAD, RCA 100p, VG->RPDA ok, VG->RI 95 (3.5x48 Synergy XD DES).   Drug abuse (HCC)    hx; tested for cocaine as recently as 2/08. says he is not using drugs now - avoided defib. for this reason    ESRD (end stage renal disease) (HCC)    Hemo M-W-F- [facility]   Fall at home 10/2020   GERD (gastroesophageal reflux disease)    takes OTC meds as needed   GI bleeding    a. 11/2019 EGD: angiodysplastic lesions w/ bleeding s/p argon plasma/clipping/epi inj. Multiple admissions for the same.   Glaucoma    uses eye drops daily   Hepatitis B 1968   tx'd w/isolation; caught it from toilet stools in gym   History of blood transfusion 03/01/2019   History of colon polyps    benign   History of gout    takes Allopurinol  daily as well as Colchicine -if needed (02/28/2018)   History of  kidney stones    HTN (hypertension)    takes Coreg ,Imdur .and Apresoline  daily   Human immunodeficiency virus (HIV) disease (HCC) dx'd 1995   on Biktarvy  as of 12/2020.     Hyperlipidemia    Ischemic cardiomyopathy    a. 01/2019 Echo: EF 40-45%, diffuse HK, mild basal septal hypertrophy. Diast dysfxn. Nl RV size/fxn. Sev dil LA. Triv MR/TR/PR.   Muscle spasm    takes Zanaflex  as needed   Myocardial infarction (HCC) ~ 2004/2005   Nocturia    Peripheral neuropathy    takes gabapentin  daily   Pneumonia    at least twice (02/28/2018)   SDH (subdural hematoma) (HCC)    Syphilis, unspecified    Type II diabetes mellitus (HCC) 2004   Lantus  daily.Average fasting blood sugar  125-199   Wears glasses    Wears partial dentures      Past Surgical History:  Procedure Laterality Date   A/V FISTULAGRAM N/A 12/07/2023   Procedure: A/V Fistulagram;  Surgeon: Norine Manuelita LABOR, MD;  [facility]: [facility] INV[facility] LAB;  Service: Cardiovascular;  Laterality: N/A;   AV FISTULA PLACEMENT Left 08/02/2018   Procedure: ARTERIOVENOUS (AV) FISTULA CREATION  left arm radiocephlic;  Surgeon: Gretta Lonni PARAS, MD;  Location: [facility] OR;  Service: Vascular;  Laterality: Left;   AV FISTULA PLACEMENT Left 08/01/2019   Procedure: LEFT BRACHIOCEPHALIC ARTERIOVENOUS (AV) FISTULA CREATION;  Surgeon: Oris Krystal FALCON, MD;  Location: [facility] OR;  Service: Vascular;  Laterality: Left;   AV FISTULA PLACEMENT Right 04/12/2022   Procedure: RIGHT UPPER EXTREMITY ARTERIOVENOUS (AV)  GRAFT INSERTION USING GORE STRETCH 4-7 MM;  Surgeon: Eliza Lonni RAMAN, MD;  Location: [facility] OR;  Service: Vascular;  Laterality: Right;   BASCILIC VEIN TRANSPOSITION Left 10/03/2019   Procedure: BASILIC VEIN TRANSPOSITION LEFT SECOND STAGE;  Surgeon: Oris Krystal FALCON, MD;  Location: [facility] OR;  Service: Vascular;  Laterality: Left;   BIOPSY  01/25/2021   Procedure: BIOPSY;  Surgeon: Legrand Victory LITTIE DOUGLAS, MD;  Location: [facility] ENDOSCOPY;  Service: Gastroenterology;;   CARDIAC CATHETERIZATION  10/2002; 12/19/2004   thelbert 03/08/2011   COLONOSCOPY  2013   Dr.John Abran    CORONARY ARTERY BYPASS GRAFT  02/24/2003   CABG X2/notes 03/08/2011   CORONARY STENT INTERVENTION N/A 12/19/2019   Procedure: CORONARY STENT INTERVENTION;  Surgeon: Dann Candyce RAMAN, MD;  [facility];  Service: Cardiovascular;  Laterality: N/A;   CORONARY ULTRASOUND/IVUS N/A 12/19/2019   Procedure: Intravascular Ultrasound/IVUS;  Surgeon: Dann Candyce RAMAN, MD;  Location: [facility] INVASIVE CV LAB;  Service: Cardiovascular;  Laterality: N/A;   ENTEROSCOPY N/A 01/25/2021   Procedure: ENTEROSCOPY;  Surgeon: Legrand Victory LITTIE DOUGLAS, MD;  Location: [facility] ENDOSCOPY;  Service:  Gastroenterology;  Laterality: N/A;   ENTEROSCOPY N/A 02/13/2021   Procedure: ENTEROSCOPY;  Surgeon: Charlanne Groom, MD;  Location: [facility] ENDOSCOPY;  Service: Endoscopy;  Laterality: N/A;   ENTEROSCOPY N/A 05/07/2021   Procedure: ENTEROSCOPY;  Surgeon: Leigh Elspeth SQUIBB, MD;  Location: [facility] ENDOSCOPY;  Service: Gastroenterology;  Laterality: N/A;   ESOPHAGOGASTRODUODENOSCOPY (EGD) WITH PROPOFOL  N/A 02/08/2019   Procedure: ESOPHAGOGASTRODUODENOSCOPY (EGD) WITH PROPOFOL ;  Surgeon: Teressa Toribio SQUIBB, MD;  Location: [facility] ENDOSCOPY;  Service: Gastroenterology;  Laterality: N/A;   ESOPHAGOGASTRODUODENOSCOPY (EGD) WITH PROPOFOL  N/A 12/22/2019   Procedure: ESOPHAGOGASTRODUODENOSCOPY (EGD) WITH PROPOFOL ;  Surgeon: [name] Sandor GAILS, DO;  Location: [facility] ENDOSCOPY;  Service: Gastroenterology;  Laterality: N/A;   ESOPHAGOGASTRODUODENOSCOPY (EGD) WITH PROPOFOL  N/A 10/19/2020   Procedure: ESOPHAGOGASTRODUODENOSCOPY (EGD) WITH PROPOFOL ;  Surgeon: Charlanne Groom, MD;  Location: [facility] ENDOSCOPY;  Service: Endoscopy;  Laterality: N/A;   ESOPHAGOGASTRODUODENOSCOPY (EGD) WITH PROPOFOL  N/A 12/22/2020   Procedure: ESOPHAGOGASTRODUODENOSCOPY (EGD) WITH PROPOFOL ;  Surgeon: Avram Lupita BRAVO, MD;  Location: [facility] ENDOSCOPY;  Service: Endoscopy;  Laterality: N/A;   ESOPHAGOGASTRODUODENOSCOPY (EGD) WITH PROPOFOL  N/A 01/09/2021   Procedure: ESOPHAGOGASTRODUODENOSCOPY (EGD) WITH PROPOFOL ;  Surgeon: Abran Norleen SAILOR, MD;  Location: [facility] ENDOSCOPY;  Service: Endoscopy;  Laterality: N/A;   HEMOSTASIS CLIP PLACEMENT  12/22/2019   Procedure: HEMOSTASIS CLIP PLACEMENT;  Surgeon: [name] Sandor GAILS, DO;  Location: [facility] ENDOSCOPY;  Service: Gastroenterology;;   HEMOSTASIS CLIP PLACEMENT  12/22/2020   Procedure: HEMOSTASIS CLIP PLACEMENT;  Surgeon: Avram Lupita BRAVO, MD;  Location: [facility] ENDOSCOPY;  Service: Endoscopy;;   HEMOSTASIS CONTROL  12/22/2020   Procedure: HEMOSTASIS CONTROL/hemospray;  Surgeon: Avram Lupita BRAVO, MD;  Location: [facility] ENDOSCOPY;  Service: Endoscopy;;    HOT HEMOSTASIS N/A 02/08/2019   Procedure: HOT HEMOSTASIS (ARGON PLASMA COAGULATION/BICAP);  Surgeon: Teressa Toribio SQUIBB, MD;  Location: [facility] ENDOSCOPY;  Service: Gastroenterology;  Laterality: N/A;   HOT HEMOSTASIS N/A 12/22/2019   Procedure: HOT HEMOSTASIS (ARGON PLASMA COAGULATION/BICAP);  Surgeon: [name] Sandor GAILS, DO;  Location: [facility] ENDOSCOPY;  Service: Gastroenterology;  Laterality: N/A;   HOT HEMOSTASIS N/A 10/19/2020   Procedure: HOT HEMOSTASIS (ARGON PLASMA COAGULATION/BICAP);  Surgeon: Charlanne Groom, MD;  Location: [facility] ENDOSCOPY;  Service: Endoscopy;  Laterality: N/A;   HOT HEMOSTASIS N/A 12/22/2020   Procedure: HOT HEMOSTASIS (ARGON PLASMA COAGULATION/BICAP);  Surgeon: Avram Lupita BRAVO, MD;  Location: [facility] ENDOSCOPY;  Service: Endoscopy;  Laterality: N/A;   HOT HEMOSTASIS N/A 01/09/2021   Procedure: HOT HEMOSTASIS (ARGON PLASMA COAGULATION/BICAP);  Surgeon: Abran Norleen SAILOR, MD;  Location: [facility] ENDOSCOPY;  Service: Endoscopy;  Laterality: N/A;   HOT HEMOSTASIS N/A 01/25/2021   Procedure: HOT HEMOSTASIS (ARGON PLASMA COAGULATION/BICAP);  Surgeon: Legrand Victory LITTIE DOUGLAS, MD;  Location: [facility] ENDOSCOPY;  Service: Gastroenterology;  Laterality: N/A;   HOT HEMOSTASIS N/A 02/13/2021   Procedure: HOT HEMOSTASIS (ARGON PLASMA COAGULATION/BICAP);  Surgeon: Charlanne Groom, MD;  Location: [facility] ENDOSCOPY;  Service: Endoscopy;  Laterality: N/A;   HOT HEMOSTASIS N/A 05/07/2021   Procedure: HOT HEMOSTASIS (ARGON PLASMA COAGULATION/BICAP);  Surgeon: Leigh Elspeth SQUIBB, MD;  Location: [facility] ENDOSCOPY;  Service: Gastroenterology;  Laterality: N/A;   INSERTION OF DIALYSIS CATHETER N/A 02/08/2022   Procedure: INSERTION OF TUNNELED DIALYSIS CATHETER;  Surgeon: Eliza Lonni RAMAN, MD;  Location: [facility] OR;  Service: Vascular;  Laterality: N/A;   INTERTROCHANTERIC HIP FRACTURE SURGERY Left 11/2006   thelbert 03/08/2011   IR FLUORO GUIDE CV LINE LEFT  03/14/2022   IR FLUORO GUIDE CV LINE RIGHT  07/24/2019   IR FLUORO GUIDE CV LINE RIGHT   07/30/2019   IR US  GUIDE VASC ACCESS RIGHT  07/24/2019   IR US  GUIDE VASC ACCESS RIGHT  07/30/2019   LAPAROSCOPIC CHOLECYSTECTOMY  05/2006   LIGATION OF ARTERIOVENOUS  FISTULA Left 02/08/2022   Procedure: LIGATION OF LEFT ARTERIOVENOUS  FISTULA;  Surgeon: Eliza Lonni RAMAN, MD;  Location: [facility] OR;  Service: Vascular;  Laterality: Left;   LIGATION OF COMPETING BRANCHES OF ARTERIOVENOUS FISTULA Left 11/05/2018   Procedure: LIGATION OF COMPETING BRANCHES OF ARTERIOVENOUS FISTULA  LEFT  ARM;  Surgeon: Gretta Lonni PARAS, MD;  Location: [facility] OR;  Service: Vascular;  Laterality: Left;   LOWER EXTREMITY ANGIOGRAPHY N/A 04/18/2024   Procedure: Lower Extremity Angiography;  Surgeon: Gretta Lonni PARAS, MD;  [facility]: [facility] LAB;  Service: Cardiovascular;  Laterality: N/A;   LUMBAR LAMINECTOMY/DECOMPRESSION MICRODISCECTOMY N/A  02/29/2016   Procedure: Left L4-5 Lateral Recess Decompression, Removal Extradural Intraspinal Facet Cyst;  Surgeon: Oneil JAYSON Herald, MD;  Location: [facility] OR;  Service: Orthopedics;  Laterality: N/A;   METATARSAL HEAD EXCISION Right 07/20/2022   Procedure: Irragation and debridement of right foot wound with extraction of all necrotic soft tissue and bone;  Surgeon: Malvin Marsa FALCON, DPM;  Location: [facility] OR;  Service: Podiatry;  Laterality: Right;  Right 4th Met head and base of proximal phalanx resection   MULTIPLE TOOTH EXTRACTIONS     ORIF MANDIBULAR FRACTURE Left 08/13/2004   ORIF of left body fracture mandible with KLS Martin 2.3-mm six hole/notes 03/08/2011   RIGHT/LEFT HEART CATH AND CORONARY/GRAFT ANGIOGRAPHY N/A 12/19/2019   Procedure: RIGHT/LEFT HEART CATH AND CORONARY/GRAFT ANGIOGRAPHY;  Surgeon: Dann Candyce RAMAN, MD;  Location: [facility] INVASIVE CV LAB;  Service: Cardiovascular;  Laterality: N/A;   SCLEROTHERAPY  12/22/2019   Procedure: SCLEROTHERAPY;  Surgeon: [name] Sandor GAILS, DO;  Location: [facility] ENDOSCOPY;  Service: Gastroenterology;;   MATIAS  02/13/2021    Procedure: MATIAS;  Surgeon: Charlanne Groom, MD;  Location: [facility] ENDOSCOPY;  Service: Endoscopy;;        Review of Systems   Objective:   There were no vitals taken for this visit. Nursing note and vital signs reviewed.  Physical Exam       03/05/2024    9:43 AM 02/27/2024    3:50 PM 02/22/2024    9:40 AM 02/13/2024   12:41 PM 01/30/2024   10:18 AM  Depression screen PHQ 2/9  Decreased Interest 0 0 0 0 0  Down, Depressed, Hopeless 0 0 0 0 0  PHQ - 2 Score 0 0 0 0 0  Altered sleeping   0  0  Tired, decreased energy   0  0  Change in appetite   0  0  Feeling bad or failure about yourself    0  0  Trouble concentrating   0  0  Moving slowly or fidgety/restless   0  0  Suicidal thoughts   0  0  PHQ-9 Score   0  0  Difficult doing work/chores   Not difficult at all          02/22/2024    9:40 AM  GAD 7 : Generalized Anxiety Score  Nervous, Anxious, on Edge 0  Control/stop worrying 0  Worry too much - different things 0  Trouble relaxing 0  Restless 0  Easily annoyed or irritable 0  Afraid - awful might happen 0  Total GAD 7 Score 0  Anxiety Difficulty Not difficult at all     The ASCVD Risk score (Arnett DK, et al., 2019) failed to calculate for the following reasons:   Risk score cannot be calculated because patient has a medical history suggesting prior/existing ASCVD      Assessment & Plan:    Patient Active Problem List   Diagnosis Date Noted   Rectal bleeding 04/24/2024   Malnutrition of moderate degree 04/19/2024   Stroke (HCC) 04/19/2024   Acute metabolic encephalopathy 04/18/2024   History of hepatitis C 04/18/2024   History of GI bleeding due to AVMs 04/18/2024   Ischemic leg wound 04/17/2024   Leg wound, right 02/22/2024   Volume overload 01/23/2024   Fluid overload 06/14/2023   Encounter for smoking cessation counseling 03/07/2023   Diabetic infection of right foot (HCC) 07/18/2022   Chronic pain syndrome 03/29/2022   Chronic lower back  pain    Thrombocytopenia (HCC)  Hypercalcemia 04/13/2021   Helicobacter pylori (H. pylori) infection 04/06/2021   Lumbar foraminal stenosis 03/30/2021   Gastric hemorrhage due to angiodysplasia of stomach    GI bleed 11/24/2020   COVID-19 virus infection 11/24/2020   Other mechanical complication of cranial or spinal infusion catheter, sequela 11/11/2020   Subdural hematoma (HCC) 11/06/2020   Fall at home, initial encounter 11/05/2020   Nicotine  dependence, cigarettes, uncomplicated 11/05/2020   Alcohol  abuse 11/05/2020   Unspecified trochanteric fracture of left femur, initial encounter for closed fracture (HCC) 11/05/2020   Traumatic subdural hematoma, initial encounter (HCC) 11/05/2020   Fracture of metatarsal of right foot, closed 11/05/2020   Nondisplaced fracture of greater trochanter of left femur, initial encounter for closed fracture (HCC)    Uremia    Angiodysplasia of intestine with hemorrhage    Pruritus, unspecified 10/02/2020   Iron  deficiency anemia    Acute blood loss anemia    Acute on chronic anemia    Abnormal nuclear stress test 12/19/2019   Type 2 diabetes mellitus with diabetic neuropathy, unspecified (HCC) 08/07/2019   Allergy, unspecified, initial encounter 08/02/2019   Complication of vascular dialysis catheter 08/02/2019   Drug abuse counseling and surveillance of drug abuser 08/02/2019   Other specified coagulation defects (HCC) 08/02/2019   Secondary hyperparathyroidism of renal origin (HCC) 08/02/2019   Unspecified atrial fibrillation (HCC) 08/02/2019   ESRD (end stage renal disease) (HCC)    Cocaine abuse (HCC)    CHF exacerbation (HCC) 07/21/2019   Diabetic foot ulcer (HCC) 02/11/2019   AVM (arteriovenous malformation)    Melena 02/07/2019   Symptomatic anemia 02/06/2019   Hyperlipidemia associated with type 2 diabetes mellitus (HCC) 05/30/2018   Right foot ulcer (HCC) 02/28/2018   Chronic obstructive pulmonary disease (HCC) 02/28/2018    Anemia of chronic disease 11/20/2016   Chronic systolic CHF (congestive heart failure) (HCC) 11/10/2016   History of lumbar laminectomy for spinal cord decompression 02/29/2016   Type 2 diabetes mellitus with diabetic polyneuropathy, with long-term current use of insulin  (HCC) 06/17/2015   HTN (hypertension) 04/26/2015   DM (diabetes mellitus) (HCC) 04/26/2015   Ischemic cardiomyopathy 05/12/2014   Hyperkalemia 09/06/2013   DM II, diet controlled 12/30/2011   Bunion of left foot 11/25/2011   Bunion, right foot 11/25/2011   Polysubstance abuse (HCC) 07/28/2011   Hip fracture, left (HCC) 04/04/2011   Closed fracture of neck of femur (HCC) 04/04/2011   Insomnia 02/18/2011   Coronary artery disease with angina 04/20/2009   Acute on chronic combined systolic and diastolic CHF (congestive heart failure) (HCC) 04/20/2009   Mixed hyperlipidemia 11/20/2006   Gout 11/20/2006   TOBACCO ABUSE 11/20/2006   Essential hypertension 11/20/2006   Human immunodeficiency virus (HIV) disease (HCC) 09/02/2006     Problem List Items Addressed This Visit   None    I am having Elsie FABIENE Qualia Ray maintain his albuterol  and oxyCODONE -acetaminophen .   No orders of the defined types were placed in this encounter.    Follow-up: No follow-ups on file. or sooner if needed.    Cathlyn July, MSN, FNP-C Nurse Practitioner [facility] Main number: [phone]

## 2024-05-01 ENCOUNTER — Other Ambulatory Visit: Payer: Self-pay

## 2024-05-01 ENCOUNTER — Telehealth: Payer: Self-pay

## 2024-05-01 DIAGNOSIS — D649 Anemia, unspecified: Secondary | ICD-10-CM

## 2024-05-01 DIAGNOSIS — K5520 Angiodysplasia of colon without hemorrhage: Secondary | ICD-10-CM

## 2024-05-01 NOTE — Telephone Encounter (Signed)
   Octreotide  reordered per Dr Leigh

## 2024-05-02 ENCOUNTER — Inpatient Hospital Stay (HOSPITAL_COMMUNITY): Admission: RE | Admit: 2024-05-02 | Source: Ambulatory Visit

## 2024-05-07 ENCOUNTER — Ambulatory Visit: Admitting: Cardiovascular Disease

## 2024-05-07 ENCOUNTER — Encounter: Admitting: Family

## 2024-05-08 ENCOUNTER — Other Ambulatory Visit: Payer: Self-pay

## 2024-05-08 ENCOUNTER — Other Ambulatory Visit (HOSPITAL_COMMUNITY): Payer: Self-pay

## 2024-05-08 NOTE — Progress Notes (Signed)
 Disenrolling patient from [facility].   Patient is deceased per EPIC/WAM.

## 2024-05-09 ENCOUNTER — Other Ambulatory Visit: Payer: Self-pay | Admitting: Family Medicine

## 2024-05-09 ENCOUNTER — Other Ambulatory Visit: Payer: Self-pay

## 2024-05-24 NOTE — Progress Notes (Signed)
 Late Note Entry- April 29, 2024  Contacted [facility] to be advised of pt being made comfort care and d/c to hospice home yesterday.   Randine Mungo Renal Navigator [phone]

## 2024-05-24 DEATH — deceased

## 2024-05-25 ENCOUNTER — Other Ambulatory Visit: Payer: Self-pay | Admitting: Sports Medicine

## 2024-05-30 ENCOUNTER — Encounter (HOSPITAL_COMMUNITY)

## 2024-06-04 ENCOUNTER — Encounter

## 2024-06-04 ENCOUNTER — Encounter (HOSPITAL_COMMUNITY)
